# Patient Record
Sex: Female | Born: 1965 | Race: Black or African American | Hispanic: No | Smoking: Current every day smoker
Health system: Southern US, Community
[De-identification: ages and names within clinical notes are randomized; demographics above are authoritative.]

## PROBLEM LIST (undated history)

## (undated) ENCOUNTER — Emergency Department (HOSPITAL_COMMUNITY)

## (undated) DIAGNOSIS — J449 Chronic obstructive pulmonary disease, unspecified: Secondary | ICD-10-CM

## (undated) DIAGNOSIS — R110 Nausea: Secondary | ICD-10-CM

## (undated) DIAGNOSIS — F329 Major depressive disorder, single episode, unspecified: Secondary | ICD-10-CM

## (undated) DIAGNOSIS — E785 Hyperlipidemia, unspecified: Secondary | ICD-10-CM

## (undated) DIAGNOSIS — F32A Depression, unspecified: Secondary | ICD-10-CM

## (undated) DIAGNOSIS — G8929 Other chronic pain: Secondary | ICD-10-CM

## (undated) DIAGNOSIS — K219 Gastro-esophageal reflux disease without esophagitis: Secondary | ICD-10-CM

## (undated) DIAGNOSIS — F419 Anxiety disorder, unspecified: Secondary | ICD-10-CM

## (undated) DIAGNOSIS — Z9981 Dependence on supplemental oxygen: Secondary | ICD-10-CM

## (undated) DIAGNOSIS — I4891 Unspecified atrial fibrillation: Secondary | ICD-10-CM

## (undated) DIAGNOSIS — F99 Mental disorder, not otherwise specified: Secondary | ICD-10-CM

## (undated) DIAGNOSIS — R0600 Dyspnea, unspecified: Secondary | ICD-10-CM

## (undated) DIAGNOSIS — E039 Hypothyroidism, unspecified: Secondary | ICD-10-CM

## (undated) DIAGNOSIS — E1140 Type 2 diabetes mellitus with diabetic neuropathy, unspecified: Secondary | ICD-10-CM

## (undated) DIAGNOSIS — F23 Brief psychotic disorder: Secondary | ICD-10-CM

## (undated) DIAGNOSIS — R51 Headache: Secondary | ICD-10-CM

## (undated) DIAGNOSIS — M549 Dorsalgia, unspecified: Secondary | ICD-10-CM

## (undated) DIAGNOSIS — I10 Essential (primary) hypertension: Secondary | ICD-10-CM

## (undated) DIAGNOSIS — F209 Schizophrenia, unspecified: Secondary | ICD-10-CM

## (undated) DIAGNOSIS — T50901A Poisoning by unspecified drugs, medicaments and biological substances, accidental (unintentional), initial encounter: Secondary | ICD-10-CM

## (undated) DIAGNOSIS — Z720 Tobacco use: Secondary | ICD-10-CM

## (undated) DIAGNOSIS — M25569 Pain in unspecified knee: Secondary | ICD-10-CM

## (undated) HISTORY — PX: ABDOMINAL HYSTERECTOMY: SHX81

## (undated) HISTORY — DX: Essential (primary) hypertension: I10

## (undated) HISTORY — DX: Tobacco use: Z72.0

## (undated) HISTORY — DX: Other chronic pain: G89.29

## (undated) HISTORY — DX: Hyperlipidemia, unspecified: E78.5

## (undated) HISTORY — PX: KNEE SURGERY: SHX244

## (undated) HISTORY — DX: Poisoning by unspecified drugs, medicaments and biological substances, accidental (unintentional), initial encounter: T50.901A

---

## 1997-10-24 ENCOUNTER — Encounter: Admission: RE | Admit: 1997-10-24 | Discharge: 1998-01-22 | Payer: Self-pay | Admitting: *Deleted

## 1997-11-05 ENCOUNTER — Emergency Department (HOSPITAL_COMMUNITY): Admission: EM | Admit: 1997-11-05 | Discharge: 1997-11-05 | Payer: Self-pay | Admitting: Emergency Medicine

## 1997-11-09 ENCOUNTER — Emergency Department (HOSPITAL_COMMUNITY): Admission: EM | Admit: 1997-11-09 | Discharge: 1997-11-09 | Payer: Self-pay | Admitting: Emergency Medicine

## 1998-01-16 ENCOUNTER — Emergency Department (HOSPITAL_COMMUNITY): Admission: EM | Admit: 1998-01-16 | Discharge: 1998-01-16 | Payer: Self-pay | Admitting: Family Medicine

## 1998-03-05 ENCOUNTER — Inpatient Hospital Stay (HOSPITAL_COMMUNITY): Admission: AD | Admit: 1998-03-05 | Discharge: 1998-03-05 | Payer: Self-pay | Admitting: Obstetrics & Gynecology

## 1998-03-22 ENCOUNTER — Emergency Department (HOSPITAL_COMMUNITY): Admission: EM | Admit: 1998-03-22 | Discharge: 1998-03-22 | Payer: Self-pay | Admitting: Emergency Medicine

## 1998-04-05 ENCOUNTER — Other Ambulatory Visit: Admission: RE | Admit: 1998-04-05 | Discharge: 1998-04-05 | Payer: Self-pay | Admitting: Internal Medicine

## 1998-05-15 ENCOUNTER — Encounter: Admission: RE | Admit: 1998-05-15 | Discharge: 1998-05-15 | Payer: Self-pay | Admitting: Obstetrics & Gynecology

## 1999-04-12 ENCOUNTER — Emergency Department (HOSPITAL_COMMUNITY): Admission: EM | Admit: 1999-04-12 | Discharge: 1999-04-12 | Payer: Self-pay

## 1999-06-08 ENCOUNTER — Emergency Department (HOSPITAL_COMMUNITY): Admission: EM | Admit: 1999-06-08 | Discharge: 1999-06-08 | Payer: Self-pay

## 1999-08-15 ENCOUNTER — Emergency Department (HOSPITAL_COMMUNITY): Admission: EM | Admit: 1999-08-15 | Discharge: 1999-08-15 | Payer: Self-pay

## 1999-10-21 ENCOUNTER — Emergency Department (HOSPITAL_COMMUNITY): Admission: EM | Admit: 1999-10-21 | Discharge: 1999-10-22 | Payer: Self-pay | Admitting: Emergency Medicine

## 2000-06-25 ENCOUNTER — Inpatient Hospital Stay (HOSPITAL_COMMUNITY): Admission: AD | Admit: 2000-06-25 | Discharge: 2000-06-25 | Payer: Self-pay | Admitting: Obstetrics & Gynecology

## 2001-09-26 ENCOUNTER — Emergency Department (HOSPITAL_COMMUNITY): Admission: EM | Admit: 2001-09-26 | Discharge: 2001-09-26 | Payer: Self-pay | Admitting: Emergency Medicine

## 2001-09-26 ENCOUNTER — Encounter: Payer: Self-pay | Admitting: Emergency Medicine

## 2002-09-24 ENCOUNTER — Emergency Department (HOSPITAL_COMMUNITY): Admission: EM | Admit: 2002-09-24 | Discharge: 2002-09-24 | Payer: Self-pay | Admitting: Emergency Medicine

## 2003-06-04 ENCOUNTER — Emergency Department (HOSPITAL_COMMUNITY): Admission: EM | Admit: 2003-06-04 | Discharge: 2003-06-04 | Payer: Self-pay | Admitting: Emergency Medicine

## 2003-06-11 ENCOUNTER — Emergency Department (HOSPITAL_COMMUNITY): Admission: EM | Admit: 2003-06-11 | Discharge: 2003-06-11 | Payer: Self-pay | Admitting: Emergency Medicine

## 2003-06-25 ENCOUNTER — Emergency Department (HOSPITAL_COMMUNITY): Admission: AD | Admit: 2003-06-25 | Discharge: 2003-06-25 | Payer: Self-pay | Admitting: Family Medicine

## 2003-06-29 ENCOUNTER — Encounter: Admission: RE | Admit: 2003-06-29 | Discharge: 2003-08-03 | Payer: Self-pay | Admitting: Occupational Medicine

## 2003-09-12 ENCOUNTER — Encounter
Admission: RE | Admit: 2003-09-12 | Discharge: 2003-12-11 | Payer: Self-pay | Admitting: Physical Medicine & Rehabilitation

## 2003-09-15 ENCOUNTER — Emergency Department (HOSPITAL_COMMUNITY): Admission: AD | Admit: 2003-09-15 | Discharge: 2003-09-15 | Payer: Self-pay | Admitting: Family Medicine

## 2003-11-22 ENCOUNTER — Emergency Department (HOSPITAL_COMMUNITY): Admission: EM | Admit: 2003-11-22 | Discharge: 2003-11-22 | Payer: Self-pay | Admitting: Emergency Medicine

## 2004-02-26 ENCOUNTER — Emergency Department (HOSPITAL_COMMUNITY): Admission: EM | Admit: 2004-02-26 | Discharge: 2004-02-26 | Payer: Self-pay | Admitting: Emergency Medicine

## 2004-05-24 ENCOUNTER — Emergency Department (HOSPITAL_COMMUNITY): Admission: EM | Admit: 2004-05-24 | Discharge: 2004-05-24 | Payer: Self-pay | Admitting: Emergency Medicine

## 2004-06-23 ENCOUNTER — Emergency Department (HOSPITAL_COMMUNITY): Admission: EM | Admit: 2004-06-23 | Discharge: 2004-06-23 | Payer: Self-pay | Admitting: Emergency Medicine

## 2005-04-25 ENCOUNTER — Emergency Department (HOSPITAL_COMMUNITY): Admission: EM | Admit: 2005-04-25 | Discharge: 2005-04-25 | Payer: Self-pay | Admitting: Emergency Medicine

## 2005-04-26 ENCOUNTER — Emergency Department (HOSPITAL_COMMUNITY): Admission: EM | Admit: 2005-04-26 | Discharge: 2005-04-26 | Payer: Self-pay | Admitting: Emergency Medicine

## 2005-08-07 ENCOUNTER — Emergency Department (HOSPITAL_COMMUNITY): Admission: EM | Admit: 2005-08-07 | Discharge: 2005-08-07 | Payer: Self-pay | Admitting: Emergency Medicine

## 2005-10-20 ENCOUNTER — Emergency Department (HOSPITAL_COMMUNITY): Admission: EM | Admit: 2005-10-20 | Discharge: 2005-10-20 | Payer: Self-pay | Admitting: Emergency Medicine

## 2006-09-29 ENCOUNTER — Emergency Department (HOSPITAL_COMMUNITY): Admission: EM | Admit: 2006-09-29 | Discharge: 2006-09-29 | Payer: Self-pay | Admitting: Emergency Medicine

## 2007-01-28 ENCOUNTER — Encounter: Admission: RE | Admit: 2007-01-28 | Discharge: 2007-01-28 | Payer: Self-pay | Admitting: Internal Medicine

## 2007-04-13 ENCOUNTER — Ambulatory Visit (HOSPITAL_COMMUNITY): Admission: RE | Admit: 2007-04-13 | Discharge: 2007-04-14 | Payer: Self-pay | Admitting: Internal Medicine

## 2007-05-02 ENCOUNTER — Emergency Department (HOSPITAL_COMMUNITY): Admission: EM | Admit: 2007-05-02 | Discharge: 2007-05-02 | Payer: Self-pay | Admitting: Emergency Medicine

## 2007-06-16 ENCOUNTER — Encounter
Admission: RE | Admit: 2007-06-16 | Discharge: 2007-06-16 | Payer: Self-pay | Admitting: Physical Medicine and Rehabilitation

## 2007-07-19 ENCOUNTER — Encounter: Admission: RE | Admit: 2007-07-19 | Discharge: 2007-07-19 | Payer: Self-pay | Admitting: Internal Medicine

## 2007-07-26 ENCOUNTER — Encounter
Admission: RE | Admit: 2007-07-26 | Discharge: 2007-08-04 | Payer: Self-pay | Admitting: Physical Medicine and Rehabilitation

## 2007-08-01 ENCOUNTER — Emergency Department (HOSPITAL_COMMUNITY): Admission: EM | Admit: 2007-08-01 | Discharge: 2007-08-01 | Payer: Self-pay | Admitting: Emergency Medicine

## 2007-08-12 ENCOUNTER — Ambulatory Visit (HOSPITAL_COMMUNITY)
Admission: RE | Admit: 2007-08-12 | Discharge: 2007-08-12 | Payer: Self-pay | Admitting: Physical Medicine and Rehabilitation

## 2007-08-12 ENCOUNTER — Ambulatory Visit: Payer: Self-pay | Admitting: Vascular Surgery

## 2007-08-26 ENCOUNTER — Emergency Department (HOSPITAL_COMMUNITY): Admission: EM | Admit: 2007-08-26 | Discharge: 2007-08-26 | Payer: Self-pay | Admitting: Emergency Medicine

## 2007-08-26 ENCOUNTER — Encounter (INDEPENDENT_AMBULATORY_CARE_PROVIDER_SITE_OTHER): Payer: Self-pay | Admitting: Emergency Medicine

## 2007-11-28 ENCOUNTER — Emergency Department (HOSPITAL_COMMUNITY): Admission: EM | Admit: 2007-11-28 | Discharge: 2007-11-28 | Payer: Self-pay | Admitting: Emergency Medicine

## 2008-12-14 ENCOUNTER — Encounter: Admission: RE | Admit: 2008-12-14 | Discharge: 2008-12-14 | Payer: Self-pay | Admitting: Internal Medicine

## 2009-01-03 ENCOUNTER — Encounter: Admission: RE | Admit: 2009-01-03 | Discharge: 2009-04-03 | Payer: Self-pay | Admitting: Internal Medicine

## 2009-04-04 ENCOUNTER — Emergency Department (HOSPITAL_COMMUNITY): Admission: EM | Admit: 2009-04-04 | Discharge: 2009-04-04 | Payer: Self-pay | Admitting: Emergency Medicine

## 2009-04-10 ENCOUNTER — Emergency Department (HOSPITAL_COMMUNITY): Admission: EM | Admit: 2009-04-10 | Discharge: 2009-04-11 | Payer: Self-pay | Admitting: Emergency Medicine

## 2009-05-04 ENCOUNTER — Encounter: Admission: RE | Admit: 2009-05-04 | Discharge: 2009-06-20 | Payer: Self-pay | Admitting: Internal Medicine

## 2009-08-07 ENCOUNTER — Encounter: Admission: RE | Admit: 2009-08-07 | Discharge: 2009-08-07 | Payer: Self-pay | Admitting: Internal Medicine

## 2009-10-23 ENCOUNTER — Encounter: Admission: RE | Admit: 2009-10-23 | Discharge: 2009-10-23 | Payer: Self-pay | Admitting: Internal Medicine

## 2010-01-22 ENCOUNTER — Encounter: Admission: RE | Admit: 2010-01-22 | Discharge: 2010-01-22 | Payer: Self-pay | Admitting: Internal Medicine

## 2010-03-13 ENCOUNTER — Encounter
Admission: RE | Admit: 2010-03-13 | Discharge: 2010-03-13 | Payer: Self-pay | Source: Home / Self Care | Admitting: Internal Medicine

## 2010-06-11 ENCOUNTER — Encounter
Admission: RE | Admit: 2010-06-11 | Discharge: 2010-07-23 | Payer: Self-pay | Source: Home / Self Care | Attending: Internal Medicine | Admitting: Internal Medicine

## 2010-06-25 ENCOUNTER — Encounter
Admission: RE | Admit: 2010-06-25 | Discharge: 2010-06-25 | Payer: Self-pay | Source: Home / Self Care | Attending: Physical Medicine and Rehabilitation | Admitting: Physical Medicine and Rehabilitation

## 2010-07-09 ENCOUNTER — Encounter
Admission: RE | Admit: 2010-07-09 | Discharge: 2010-07-23 | Payer: Self-pay | Source: Home / Self Care | Attending: Internal Medicine | Admitting: Internal Medicine

## 2010-07-12 ENCOUNTER — Encounter
Admission: RE | Admit: 2010-07-12 | Discharge: 2010-07-12 | Payer: Self-pay | Source: Home / Self Care | Attending: Internal Medicine | Admitting: Internal Medicine

## 2010-07-14 ENCOUNTER — Encounter: Payer: Self-pay | Admitting: Internal Medicine

## 2010-07-15 ENCOUNTER — Encounter: Payer: Self-pay | Admitting: Internal Medicine

## 2010-07-19 ENCOUNTER — Emergency Department (HOSPITAL_COMMUNITY)
Admission: EM | Admit: 2010-07-19 | Discharge: 2010-07-19 | Payer: Self-pay | Source: Home / Self Care | Admitting: Emergency Medicine

## 2010-07-19 LAB — BASIC METABOLIC PANEL
BUN: 3 mg/dL — ABNORMAL LOW (ref 6–23)
CO2: 26 mEq/L (ref 19–32)
Calcium: 9.8 mg/dL (ref 8.4–10.5)
Chloride: 104 mEq/L (ref 96–112)
Creatinine, Ser: 0.66 mg/dL (ref 0.4–1.2)
GFR calc Af Amer: >60 mL/min (ref >60)
GFR calc non Af Amer: >60 mL/min (ref >60)
Glucose, Bld: 141 mg/dL — ABNORMAL HIGH (ref 70–99)
Sodium: 140 mEq/L (ref 135–145)

## 2010-07-19 LAB — URINALYSIS, ROUTINE W REFLEX MICROSCOPIC
Bilirubin Urine: NEGATIVE
Hgb urine dipstick: NEGATIVE
Nitrite: NEGATIVE
Urine Glucose, Fasting: NEGATIVE mg/dL

## 2010-07-19 LAB — DIFFERENTIAL
Basophils Relative: 0 % (ref 0–1)
Eosinophils Absolute: 0 10*3/uL (ref 0.0–0.7)
Lymphocytes Relative: 26 % (ref 12–46)
Lymphs Abs: 3.5 10*3/uL (ref 0.7–4.0)
Monocytes Relative: 4 % (ref 3–12)

## 2010-07-19 LAB — GLUCOSE, CAPILLARY: Glucose-Capillary: 157 mg/dL — ABNORMAL HIGH (ref 70–99)

## 2010-07-19 LAB — CBC
HCT: 44.6 % (ref 36.0–46.0)
Hemoglobin: 15.8 g/dL — ABNORMAL HIGH (ref 12.0–15.0)
RBC: 5.25 MIL/uL — ABNORMAL HIGH (ref 3.87–5.11)
RDW: 12.9 % (ref 11.5–15.5)
WBC: 13.7 10*3/uL — ABNORMAL HIGH (ref 4.0–10.5)

## 2010-08-20 ENCOUNTER — Ambulatory Visit: Payer: Self-pay | Admitting: Dietician

## 2010-08-30 ENCOUNTER — Other Ambulatory Visit (HOSPITAL_COMMUNITY): Payer: Self-pay | Admitting: Gastroenterology

## 2010-09-09 ENCOUNTER — Encounter (HOSPITAL_COMMUNITY)
Admission: RE | Admit: 2010-09-09 | Discharge: 2010-09-09 | Disposition: A | Discharge status: Home / Self Care | Payer: PRIVATE HEALTH INSURANCE | Source: Ambulatory Visit | Attending: Gastroenterology | Admitting: Gastroenterology

## 2010-09-26 LAB — CBC
HCT: 40.2 % (ref 36.0–46.0)
MCV: 90.7 fL (ref 78.0–100.0)
Platelets: 216 10*3/uL (ref 150–400)
RBC: 4.43 MIL/uL (ref 3.87–5.11)
WBC: 14.1 10*3/uL — ABNORMAL HIGH (ref 4.0–10.5)

## 2010-09-26 LAB — DIFFERENTIAL
Basophils Absolute: 0 10*3/uL (ref 0.0–0.1)
Basophils Relative: 0 % (ref 0–1)
Eosinophils Relative: 0 % (ref 0–5)
Monocytes Absolute: 0.2 10*3/uL (ref 0.1–1.0)

## 2010-09-26 LAB — COMPREHENSIVE METABOLIC PANEL
AST: 20 U/L (ref 0–37)
Albumin: 3.9 g/dL (ref 3.5–5.2)
Alkaline Phosphatase: 89 U/L (ref 39–117)
BUN: 8 mg/dL (ref 6–23)
CO2: 26 mEq/L (ref 19–32)
Chloride: 106 mEq/L (ref 96–112)
Creatinine, Ser: 0.75 mg/dL (ref 0.4–1.2)
GFR calc Af Amer: >60 mL/min (ref >60)
GFR calc non Af Amer: >60 mL/min (ref >60)
Potassium: 4.1 mEq/L (ref 3.5–5.1)
Total Bilirubin: 0.5 mg/dL (ref 0.3–1.2)

## 2010-09-26 LAB — URINALYSIS, ROUTINE W REFLEX MICROSCOPIC
Bilirubin Urine: NEGATIVE
Glucose, UA: NEGATIVE mg/dL
Hgb urine dipstick: NEGATIVE
Ketones, ur: NEGATIVE mg/dL
Leukocytes, UA: NEGATIVE
Protein, ur: 30 mg/dL — AB
pH: 8.5 — ABNORMAL HIGH (ref 5.0–8.0)

## 2010-09-26 LAB — URINE MICROSCOPIC-ADD ON

## 2010-09-26 LAB — LIPASE, BLOOD: Lipase: 23 U/L (ref 11–59)

## 2010-10-30 ENCOUNTER — Other Ambulatory Visit (HOSPITAL_COMMUNITY): Payer: Self-pay | Admitting: Gastroenterology

## 2010-11-08 NOTE — Group Therapy Note (Signed)
HISTORY:  This 45 year old female reports being on the job at [employer] on  May 22, 2003 when a heavy box fell off the belt into her arms and she  felt immediate popping and burning in the back which lasted all day; no  relief with Tylenol.  She went to [facility] emergency department the  following day, received and injection (question Toradol), and some pain  medications which she states were not helpful.  She returned to [facility]  ED x1 and went to another Ur[facility] Center on [facility] before she  was sent by [employer] to occupational medicine at [facility].  She  states she has tried OxyContin and steroid dose packs, Skelaxin,  Hydrocodone, Tramadol cyclobenzaprine, promethazine without much help.  She  no longer takes these on a regular basis but takes one or another in a  random pattern.  She does take amitriptyline 25, 2 tablets q.h.s. for about  one month now.   She states that early on she was sent to physical therapy but states that  this made her worse.  I have reviewed Dr. Lanae Crumbly notes.  On August 08, 2003 she had a maximum lifting limit of 10 pounds sitting, was allowed the  same restrictions on August 23, 2003.  She has been driving half-days.  Reviewed PT notes from outpatient therapy at [facility] on [facility].  The patient did not tolerate activity based on PT note of July 03, 2003.  Had open MRI try at imaging that showed just some mild degenerative change,  mild facet arthrosis L4-5, no foraminal or central canal stenosis.   PAST MEDICAL HISTORY:  Significant for migraines.   PAST SURGICAL HISTORY:  Denies.   PAIN EXACERBATING FACTORS:  Bending, walking, working, therapy and sometimes  sitting.  Sitting is relatively better than standing.  Pain diagram shows  posterior pain from the mid back on down to feet.  Her pain rating is an 8  out of 10 on average.   ALLERGIES:  No known drug allergies.   WORK HISTORY:  She has been working 5  years at [employer], works 12-hour days 5  days a week per her report.  Now working about 7.5 hours a day.   SOCIAL HISTORY:  Married.  Smokes.   FAMILY HISTORY:  Positive for high blood pressure.   REVIEW OF SYSTEMS:  Positive for poor sleep.   PHYSICAL EXAMINATION:  Blood pressure 118/77, pulse 85, respiratory rate 16,  O2 saturation 97% on room air.   Gait is with a limp although does not favor a particular leg, just arises  stiffly from the chair.  She is able to toe-walk and heel-walk in a guarded  fashion.  She has pain with even minimal palpation of the low back.  She has  give-away weakness in bilateral upper and lower extremities which she  attributes to her back pain.  She has pain with internal/external rotation  of the hips which she states is coming from her back. She withdraws to pin  prick testing along her feet although she states that she is numb.  Her  straight leg raising test is negative.  She has pain with even light  palpation of her lateral thighs but normal sensation.   IMPRESSION:  Lumbar strain with L3 positive Waddell's signs, that of  superficial tenderness regional disturbance and overreaction.   1. Given the numbness that she reports in the lower extremities would get an  EMG to rule out any type of peripheral neuropathy.  2. Do not think she would benefit from any type of spinal injection.  3. If EMG positive consider some Neurontin.  4. Would encourage working up to full time employment with at least the     current restrictions.   I will see her back for the EMG and make further recommendations at that  time.     Erick Colace, M.D.   AEK/MedQ  D:  09/14/2003 18:01:22  T:  09/15/2003 05:04:43  Job #:  [id number]   cc:   Zelphia Cairo. Alto Denver, M.D.  [address]  [place]  [place] [address]  Fax: [phone]

## 2010-11-26 ENCOUNTER — Encounter (HOSPITAL_COMMUNITY)
Admission: RE | Admit: 2010-11-26 | Discharge: 2010-11-26 | Disposition: A | Discharge status: Home / Self Care | Payer: PRIVATE HEALTH INSURANCE | Source: Ambulatory Visit | Attending: Gastroenterology | Admitting: Gastroenterology

## 2010-11-26 ENCOUNTER — Encounter (HOSPITAL_COMMUNITY): Payer: Self-pay

## 2010-11-26 DIAGNOSIS — R109 Unspecified abdominal pain: Secondary | ICD-10-CM | POA: Insufficient documentation

## 2010-11-26 MED ORDER — SINCALIDE 5 MCG IJ SOLR: 0.0200 ug/kg | Freq: Once | INTRAMUSCULAR | Status: DC

## 2010-11-26 MED ORDER — TECHNETIUM TC 99M MEBROFENIN IV KIT
5.5000 | PACK | Freq: Once | INTRAVENOUS | Status: AC | PRN
  Administered 2010-11-26: 5.5 via INTRAVENOUS

## 2010-12-02 ENCOUNTER — Other Ambulatory Visit: Payer: Self-pay | Admitting: Gastroenterology

## 2010-12-05 ENCOUNTER — Ambulatory Visit
Admission: RE | Admit: 2010-12-05 | Discharge: 2010-12-05 | Disposition: A | Discharge status: Home / Self Care | Payer: PRIVATE HEALTH INSURANCE | Source: Ambulatory Visit | Attending: Gastroenterology | Admitting: Gastroenterology

## 2010-12-05 MED ORDER — IOHEXOL 350 MG/ML SOLN
100.0000 mL | Freq: Once | INTRAVENOUS | Status: AC | PRN
  Administered 2010-12-05: 100 mL via INTRAVENOUS

## 2011-02-24 ENCOUNTER — Emergency Department (HOSPITAL_COMMUNITY): Payer: PRIVATE HEALTH INSURANCE

## 2011-02-24 ENCOUNTER — Emergency Department (HOSPITAL_COMMUNITY)
Admission: EM | Admit: 2011-02-24 | Discharge: 2011-02-24 | Disposition: A | Discharge status: Home / Self Care | Payer: PRIVATE HEALTH INSURANCE | Attending: Emergency Medicine | Admitting: Emergency Medicine

## 2011-02-24 DIAGNOSIS — E119 Type 2 diabetes mellitus without complications: Secondary | ICD-10-CM | POA: Insufficient documentation

## 2011-02-24 DIAGNOSIS — R109 Unspecified abdominal pain: Secondary | ICD-10-CM | POA: Insufficient documentation

## 2011-02-24 DIAGNOSIS — N3943 Post-void dribbling: Secondary | ICD-10-CM | POA: Insufficient documentation

## 2011-02-24 DIAGNOSIS — R110 Nausea: Secondary | ICD-10-CM | POA: Insufficient documentation

## 2011-02-24 LAB — DIFFERENTIAL
Basophils Absolute: 0 10*3/uL (ref 0.0–0.1)
Basophils Relative: 0 % (ref 0–1)
Eosinophils Relative: 1 % (ref 0–5)
Lymphocytes Relative: 22 % (ref 12–46)
Monocytes Absolute: 0.4 10*3/uL (ref 0.1–1.0)
Neutro Abs: 6.7 10*3/uL (ref 1.7–7.7)

## 2011-02-24 LAB — CBC
MCHC: 33.9 g/dL (ref 30.0–36.0)
RDW: 13.4 % (ref 11.5–15.5)
WBC: 9.3 10*3/uL (ref 4.0–10.5)

## 2011-02-24 LAB — URINALYSIS, ROUTINE W REFLEX MICROSCOPIC
Glucose, UA: NEGATIVE mg/dL
Hgb urine dipstick: NEGATIVE
Specific Gravity, Urine: 1.022 (ref 1.005–1.030)
Urobilinogen, UA: 1 mg/dL (ref 0.0–1.0)
pH: 7 (ref 5.0–8.0)

## 2011-02-24 LAB — COMPREHENSIVE METABOLIC PANEL
ALT: 13 U/L (ref 0–35)
AST: 17 U/L (ref 0–37)
Albumin: 3.4 g/dL — ABNORMAL LOW (ref 3.5–5.2)
Alkaline Phosphatase: 75 U/L (ref 39–117)
Calcium: 8.9 mg/dL (ref 8.4–10.5)
Potassium: 4.3 mEq/L (ref 3.5–5.1)
Sodium: 139 mEq/L (ref 135–145)
Total Protein: 6.3 g/dL (ref 6.0–8.3)

## 2011-02-24 LAB — POCT PREGNANCY, URINE: Preg Test, Ur: NEGATIVE

## 2011-02-24 MED ORDER — IOHEXOL 300 MG/ML  SOLN
100.0000 mL | Freq: Once | INTRAMUSCULAR | Status: AC | PRN
  Administered 2011-02-24: 100 mL via INTRAVENOUS

## 2011-02-25 ENCOUNTER — Other Ambulatory Visit (HOSPITAL_COMMUNITY): Payer: PRIVATE HEALTH INSURANCE

## 2011-02-27 ENCOUNTER — Encounter (HOSPITAL_COMMUNITY)
Admission: RE | Admit: 2011-02-27 | Discharge: 2011-02-27 | Disposition: A | Discharge status: Home / Self Care | Payer: PRIVATE HEALTH INSURANCE | Source: Ambulatory Visit | Attending: Obstetrics and Gynecology | Admitting: Obstetrics and Gynecology

## 2011-02-27 ENCOUNTER — Other Ambulatory Visit: Payer: Self-pay

## 2011-02-27 ENCOUNTER — Encounter (HOSPITAL_COMMUNITY): Payer: Self-pay

## 2011-02-27 HISTORY — DX: Nausea: R11.0

## 2011-02-27 HISTORY — DX: Gastro-esophageal reflux disease without esophagitis: K21.9

## 2011-02-27 HISTORY — DX: Mental disorder, not otherwise specified: F99

## 2011-02-27 HISTORY — DX: Headache: R51

## 2011-02-27 HISTORY — DX: Chronic obstructive pulmonary disease, unspecified: J44.9

## 2011-02-27 HISTORY — DX: Hypothyroidism, unspecified: E03.9

## 2011-02-27 HISTORY — DX: Anxiety disorder, unspecified: F41.9

## 2011-02-27 HISTORY — DX: Depression, unspecified: F32.A

## 2011-02-27 HISTORY — DX: Major depressive disorder, single episode, unspecified: F32.9

## 2011-02-27 LAB — BASIC METABOLIC PANEL
CO2: 31 mEq/L (ref 19–32)
Calcium: 8.6 mg/dL (ref 8.4–10.5)
Creatinine, Ser: 0.7 mg/dL (ref 0.50–1.10)
GFR calc non Af Amer: >60 mL/min (ref >60)

## 2011-02-27 LAB — PROTIME-INR
INR: 0.91 (ref 0.00–1.49)
Prothrombin Time: 12.4 seconds (ref 11.6–15.2)

## 2011-02-27 LAB — SURGICAL PCR SCREEN: Staphylococcus aureus: NEGATIVE

## 2011-02-27 LAB — CBC
MCH: 28.8 pg (ref 26.0–34.0)
MCV: 86.7 fL (ref 78.0–100.0)
Platelets: 197 10*3/uL (ref 150–400)
RDW: 13.2 % (ref 11.5–15.5)
WBC: 6.5 10*3/uL (ref 4.0–10.5)

## 2011-02-27 LAB — APTT: aPTT: 29 seconds (ref 24–37)

## 2011-02-27 NOTE — Patient Instructions (Signed)
20 Stephannie L Cala  02/27/2011   Your procedure is scheduled on:  03/04/11  Report to [facility] at 0600 AM. Arrive to main entrance  Call this number if you have problems the morning of surgery: [phone]   Remember:   Do not eat food:After Midnight.  Do not drink clear liquids: After Midnight.  Take these medicines the morning of surgery with A SIP OF WATER: bring inhaler to hospital, take oral meds per anesthesia   Do not wear jewelry, make-up or nail polish.  Do not wear lotions, powders, or perfumes. You may wear deodorant.  Do not shave 48 hours prior to surgery.  Do not bring valuables to the hospital.  Contacts, dentures or bridgework may not be worn into surgery.  Leave suitcase in the car. After surgery it may be brought to your room.  For patients admitted to the hospital, checkout time is 11:00 AM the day of discharge.   Patients discharged the day of surgery will not be allowed to drive home.  Name and phone number of your driver: Marilu Favre- [phone]  Special Instructions: CHG Shower Use Special Wash: 1/2 bottle night before surgery and 1/2 bottle morning of surgery.   Please read over the following fact sheets that you were given: none

## 2011-02-27 NOTE — Anesthesia Preprocedure Evaluation (Signed)
Anesthesia Evaluation  Name, MR# and DOB Patient awake  General Assessment Comment  Reviewed: Allergy & Precautions, H&P , NPO status , Patient's Chart, lab work & pertinent test results, reviewed documented beta blocker date and time   Airway Mallampati: III TM Distance: >3 FB Neck ROM: full    Dental  (+) Teeth Intact   Pulmonary  COPD (smoker x 25 years, currently 3 ppd) COPD inhaler  + rales  rales none    Cardiovascular Exercise Tolerance: Good regular Normal    Neuro/Psych   Headaches (migraines - once a week)  (+) PSYCHIATRIC DISORDERS (will take meds DOS (cymbalta and latuda)), Anxiety, Depression, Bipolar Disorder, Schizophrenia    GI/Hepatic/Renal   negative Liver ROS  negative Renal ROS   GERD      Endo/Other  (+) Diabetes mellitus- (recently taken off of insulin and metformin, now diet controlled), Type 2,Hypothyroidism (will take synthroid on DOS),      Abdominal   Musculoskeletal   Hematology negative hematology ROS (+)   Peds  Reproductive/Obstetrics negative OB ROS    Anesthesia Other Findings 3 PPD SMOKER            Anesthesia Physical Anesthesia Plan  ASA: III  Anesthesia Plan: General   Post-op Pain Management:    Induction:   Airway Management Planned:   Additional Equipment:   Intra-op Plan:   Post-operative Plan:   Informed Consent: I have reviewed the patients History and Physical, chart, labs and discussed the procedure including the risks, benefits and alternatives for the proposed anesthesia with the patient or authorized representative who has indicated his/her understanding and acceptance.   Dental Advisory Given  Plan Discussed with: CRNA  Anesthesia Plan Comments:         Anesthesia Quick Evaluation

## 2011-03-03 MED ORDER — DEXTROSE 5 % IV SOLN
1.0000 g | INTRAVENOUS | Status: AC
  Administered 2011-03-04: 1 g via INTRAVENOUS
  Filled 2011-03-03: qty 1

## 2011-03-03 NOTE — H&P (Signed)
44 year G2P2 presents for surgery for pelvic prolapse.  She has been evaluated by Dr. Sherron Monday for urinary incontinence.  He has recommended hysterectomy along with anterior repair , pubovaginal sling and placement of graft anteriorly.  She reports heavy periods that are very painful.  Med hx Diabetes  Anxiety Reflux  surg hx btl  Ob hx svd x 2  meds See list  All ASA  Soc history Positive for tobacco Denies EtOH Single  Fam Hx Heart Disease  AFVSS Gen alert and oriented Lung CTAB Car RRR Abd soft and nontender Pelvic EGBUS WNL Vagina Cystocele noted and uterine prolapse nonadnexal masses  IMP Urinary Incontinence Pelvic Prolapse  PLAN LAVH Dr. Sherron Monday to do cystoscopy, anterior repair, sling and graft This patient was counselled about the risks of the surgery.

## 2011-03-04 ENCOUNTER — Encounter (HOSPITAL_COMMUNITY)
Admission: RE | Disposition: A | Discharge status: Home / Self Care | Payer: Self-pay | Source: Ambulatory Visit | Attending: Obstetrics and Gynecology

## 2011-03-04 ENCOUNTER — Encounter (HOSPITAL_COMMUNITY): Payer: Self-pay | Admitting: *Deleted

## 2011-03-04 ENCOUNTER — Encounter (HOSPITAL_COMMUNITY): Payer: Self-pay | Admitting: Anesthesiology

## 2011-03-04 ENCOUNTER — Ambulatory Visit (HOSPITAL_COMMUNITY): Payer: PRIVATE HEALTH INSURANCE | Admitting: Anesthesiology

## 2011-03-04 ENCOUNTER — Encounter (HOSPITAL_COMMUNITY): Payer: Self-pay

## 2011-03-04 ENCOUNTER — Other Ambulatory Visit: Payer: Self-pay | Admitting: Obstetrics and Gynecology

## 2011-03-04 ENCOUNTER — Inpatient Hospital Stay (HOSPITAL_COMMUNITY)
Admission: RE | Admit: 2011-03-04 | Discharge: 2011-03-05 | DRG: 743 | Disposition: A | Discharge status: Home / Self Care | Payer: PRIVATE HEALTH INSURANCE | Source: Ambulatory Visit | Attending: Obstetrics and Gynecology | Admitting: Obstetrics and Gynecology

## 2011-03-04 DIAGNOSIS — D250 Submucous leiomyoma of uterus: Secondary | ICD-10-CM | POA: Diagnosis present

## 2011-03-04 DIAGNOSIS — D251 Intramural leiomyoma of uterus: Secondary | ICD-10-CM | POA: Diagnosis present

## 2011-03-04 DIAGNOSIS — N393 Stress incontinence (female) (male): Secondary | ICD-10-CM | POA: Diagnosis present

## 2011-03-04 DIAGNOSIS — E119 Type 2 diabetes mellitus without complications: Secondary | ICD-10-CM | POA: Diagnosis present

## 2011-03-04 DIAGNOSIS — N812 Incomplete uterovaginal prolapse: Principal | ICD-10-CM | POA: Diagnosis present

## 2011-03-04 DIAGNOSIS — Z90710 Acquired absence of both cervix and uterus: Secondary | ICD-10-CM

## 2011-03-04 HISTORY — PX: CYSTOSCOPY: SHX5120

## 2011-03-04 HISTORY — PX: LAPAROSCOPIC ASSISTED VAGINAL HYSTERECTOMY: SHX5398

## 2011-03-04 HISTORY — PX: BLADDER SUSPENSION: SHX72

## 2011-03-04 HISTORY — PX: CYSTOCELE REPAIR: SHX163

## 2011-03-04 LAB — GLUCOSE, CAPILLARY
Glucose-Capillary: 133 mg/dL — ABNORMAL HIGH (ref 70–99)
Glucose-Capillary: 155 mg/dL — ABNORMAL HIGH (ref 70–99)
Glucose-Capillary: 142 mg/dL — ABNORMAL HIGH (ref 70–99)
Glucose-Capillary: 123 mg/dL — ABNORMAL HIGH (ref 70–99)

## 2011-03-04 LAB — TYPE AND SCREEN
ABO/RH(D): A POS
Antibody Screen: POSITIVE
DAT, IgG: NEGATIVE

## 2011-03-04 LAB — PREGNANCY, URINE: Preg Test, Ur: NEGATIVE

## 2011-03-04 SURGERY — HYSTERECTOMY, VAGINAL, LAPAROSCOPY-ASSISTED
Anesthesia: General | Site: Vagina | Wound class: Clean Contaminated

## 2011-03-04 MED ORDER — DULOXETINE HCL 60 MG PO CPEP
90.0000 mg | ORAL_CAPSULE | Freq: Every day | ORAL | Status: DC
  Administered 2011-03-04: 90 mg via ORAL
  Filled 2011-03-04 (×2): qty 1

## 2011-03-04 MED ORDER — FENTANYL CITRATE 0.05 MG/ML IJ SOLN
INTRAMUSCULAR | Status: DC | PRN
  Administered 2011-03-04: 100 ug via INTRAVENOUS
  Administered 2011-03-04: 150 ug via INTRAVENOUS

## 2011-03-04 MED ORDER — PHENYLEPHRINE 40 MCG/ML (10ML) SYRINGE FOR IV PUSH (FOR BLOOD PRESSURE SUPPORT)
PREFILLED_SYRINGE | INTRAVENOUS | Status: AC
  Filled 2011-03-04: qty 5

## 2011-03-04 MED ORDER — ESMOLOL HCL 10 MG/ML IV SOLN
INTRAVENOUS | Status: AC
  Filled 2011-03-04: qty 10

## 2011-03-04 MED ORDER — ONDANSETRON HCL 4 MG/2ML IJ SOLN
INTRAMUSCULAR | Status: DC | PRN
  Administered 2011-03-04: 4 mg via INTRAVENOUS

## 2011-03-04 MED ORDER — MORPHINE SULFATE 10 MG/ML IJ SOLN
INTRAMUSCULAR | Status: AC
  Filled 2011-03-04: qty 1

## 2011-03-04 MED ORDER — ALBUTEROL SULFATE HFA 108 (90 BASE) MCG/ACT IN AERS
2.0000 | INHALATION_SPRAY | Freq: Four times a day (QID) | RESPIRATORY_TRACT | Status: DC | PRN
  Filled 2011-03-04: qty 6.7

## 2011-03-04 MED ORDER — NEOSTIGMINE METHYLSULFATE 1 MG/ML IJ SOLN
INTRAMUSCULAR | Status: AC
  Filled 2011-03-04: qty 10

## 2011-03-04 MED ORDER — MENTHOL 3 MG MT LOZG: 1.0000 | LOZENGE | OROMUCOSAL | Status: DC | PRN

## 2011-03-04 MED ORDER — DIPHENHYDRAMINE HCL 50 MG/ML IJ SOLN
INTRAMUSCULAR | Status: AC
  Filled 2011-03-04: qty 1

## 2011-03-04 MED ORDER — KETOROLAC TROMETHAMINE 30 MG/ML IJ SOLN: 30.0000 mg | Freq: Once | INTRAMUSCULAR | Status: DC

## 2011-03-04 MED ORDER — CLONAZEPAM 1 MG PO TABS
0.5000 mg | ORAL_TABLET | Freq: Three times a day (TID) | ORAL | Status: DC
  Administered 2011-03-04 – 2011-03-05 (×2): 1 mg via ORAL
  Filled 2011-03-04 (×3): qty 1

## 2011-03-04 MED ORDER — INSULIN ASPART 100 UNIT/ML ~~LOC~~ SOLN
0.0000 [IU] | SUBCUTANEOUS | Status: DC
  Administered 2011-03-04 – 2011-03-05 (×3): 2 [IU] via SUBCUTANEOUS
  Filled 2011-03-04: qty 3

## 2011-03-04 MED ORDER — HYDROMORPHONE HCL 1 MG/ML IJ SOLN
0.2500 mg | INTRAMUSCULAR | Status: DC | PRN
  Administered 2011-03-04 (×2): 0.5 mg via INTRAVENOUS

## 2011-03-04 MED ORDER — HYDROMORPHONE 0.3 MG/ML IV SOLN
INTRAVENOUS | Status: AC
  Filled 2011-03-04: qty 25

## 2011-03-04 MED ORDER — FENTANYL CITRATE 0.05 MG/ML IJ SOLN
INTRAMUSCULAR | Status: AC
  Filled 2011-03-04: qty 5

## 2011-03-04 MED ORDER — TRAMADOL HCL 50 MG PO TABS: 50.0000 mg | ORAL_TABLET | Freq: Four times a day (QID) | ORAL | Status: DC | PRN

## 2011-03-04 MED ORDER — DIPHENHYDRAMINE HCL 50 MG/ML IJ SOLN
12.5000 mg | Freq: Four times a day (QID) | INTRAMUSCULAR | Status: DC | PRN
  Administered 2011-03-04: 12.5 mg via INTRAVENOUS
  Filled 2011-03-04: qty 1

## 2011-03-04 MED ORDER — SCOPOLAMINE 1 MG/3DAYS TD PT72
MEDICATED_PATCH | TRANSDERMAL | Status: AC
  Filled 2011-03-04: qty 1

## 2011-03-04 MED ORDER — LACTATED RINGERS IR SOLN
Status: DC | PRN
  Administered 2011-03-04: 3000 mL

## 2011-03-04 MED ORDER — NALOXONE HCL 0.4 MG/ML IJ SOLN: 0.4000 mg | INTRAMUSCULAR | Status: DC | PRN

## 2011-03-04 MED ORDER — SUMATRIPTAN SUCCINATE 6 MG/0.5ML ~~LOC~~ SOLN
6.0000 mg | Freq: Once | SUBCUTANEOUS | Status: AC | PRN
  Filled 2011-03-04: qty 0.5

## 2011-03-04 MED ORDER — ONDANSETRON HCL 4 MG/2ML IJ SOLN
4.0000 mg | Freq: Four times a day (QID) | INTRAMUSCULAR | Status: DC | PRN
  Administered 2011-03-05: 4 mg via INTRAVENOUS
  Filled 2011-03-04: qty 2

## 2011-03-04 MED ORDER — ROCURONIUM BROMIDE 50 MG/5ML IV SOLN
INTRAVENOUS | Status: AC
  Filled 2011-03-04: qty 1

## 2011-03-04 MED ORDER — ROCURONIUM BROMIDE 100 MG/10ML IV SOLN
INTRAVENOUS | Status: DC | PRN
  Administered 2011-03-04: 35 mg via INTRAVENOUS

## 2011-03-04 MED ORDER — SODIUM CHLORIDE 0.9 % IJ SOLN: 9.0000 mL | INTRAMUSCULAR | Status: DC | PRN

## 2011-03-04 MED ORDER — HYDROXYZINE HCL 25 MG PO TABS
25.0000 mg | ORAL_TABLET | Freq: Two times a day (BID) | ORAL | Status: DC
  Administered 2011-03-04 – 2011-03-05 (×2): 25 mg via ORAL
  Filled 2011-03-04 (×5): qty 1

## 2011-03-04 MED ORDER — NEOSTIGMINE METHYLSULFATE 1 MG/ML IJ SOLN
INTRAMUSCULAR | Status: DC | PRN
  Administered 2011-03-04: 4 mg via INTRAMUSCULAR

## 2011-03-04 MED ORDER — BISACODYL 10 MG RE SUPP: 10.0000 mg | Freq: Every day | RECTAL | Status: DC | PRN

## 2011-03-04 MED ORDER — DIPHENHYDRAMINE HCL 50 MG/ML IJ SOLN
INTRAMUSCULAR | Status: DC | PRN
  Administered 2011-03-04: 12.5 mg via INTRAVENOUS

## 2011-03-04 MED ORDER — CIPROFLOXACIN HCL 250 MG PO TABS
250.0000 mg | ORAL_TABLET | Freq: Two times a day (BID) | ORAL | Status: DC
  Administered 2011-03-05: 250 mg via ORAL
  Filled 2011-03-04 (×3): qty 1

## 2011-03-04 MED ORDER — TEMAZEPAM 15 MG PO CAPS: 15.0000 mg | ORAL_CAPSULE | Freq: Every evening | ORAL | Status: DC | PRN

## 2011-03-04 MED ORDER — IBUPROFEN 800 MG PO TABS: 800.0000 mg | ORAL_TABLET | Freq: Three times a day (TID) | ORAL | Status: DC | PRN

## 2011-03-04 MED ORDER — IBUPROFEN 600 MG PO TABS
600.0000 mg | ORAL_TABLET | Freq: Four times a day (QID) | ORAL | Status: DC | PRN
  Filled 2011-03-04: qty 1

## 2011-03-04 MED ORDER — METOCLOPRAMIDE HCL 5 MG/ML IJ SOLN
INTRAMUSCULAR | Status: AC
  Administered 2011-03-04: 10 mg via INTRAVENOUS
  Filled 2011-03-04: qty 2

## 2011-03-04 MED ORDER — LIDOCAINE HCL (CARDIAC) 20 MG/ML IV SOLN
INTRAVENOUS | Status: DC | PRN
  Administered 2011-03-04: 100 mg via INTRAVENOUS

## 2011-03-04 MED ORDER — BUPIVACAINE HCL (PF) 0.25 % IJ SOLN
INTRAMUSCULAR | Status: DC | PRN
  Administered 2011-03-04: 10 mL

## 2011-03-04 MED ORDER — METOCLOPRAMIDE HCL 5 MG/ML IJ SOLN
10.0000 mg | Freq: Once | INTRAMUSCULAR | Status: AC | PRN
  Administered 2011-03-04: 10 mg via INTRAVENOUS

## 2011-03-04 MED ORDER — LURASIDONE HCL 40 MG PO TABS
40.0000 mg | ORAL_TABLET | Freq: Every day | ORAL | Status: DC
  Filled 2011-03-04 (×3): qty 1

## 2011-03-04 MED ORDER — GLYCOPYRROLATE 0.2 MG/ML IJ SOLN
INTRAMUSCULAR | Status: AC
  Filled 2011-03-04: qty 1

## 2011-03-04 MED ORDER — LIDOCAINE HCL (CARDIAC) 20 MG/ML IV SOLN
INTRAVENOUS | Status: AC
  Filled 2011-03-04: qty 5

## 2011-03-04 MED ORDER — HYDROMORPHONE 0.3 MG/ML IV SOLN
INTRAVENOUS | Status: DC
  Administered 2011-03-04: 23:00:00 via INTRAVENOUS
  Administered 2011-03-04: 11.3 mg via INTRAVENOUS
  Administered 2011-03-04: 13:00:00 via INTRAVENOUS
  Administered 2011-03-04: 6.79 mg via INTRAVENOUS
  Administered 2011-03-04: 6.67 mg via INTRAVENOUS
  Administered 2011-03-05: 1.19 mg via INTRAVENOUS
  Administered 2011-03-05: 0.2 mg via INTRAVENOUS
  Administered 2011-03-05: 11.9 mg via INTRAVENOUS

## 2011-03-04 MED ORDER — SCOPOLAMINE 1 MG/3DAYS TD PT72
MEDICATED_PATCH | TRANSDERMAL | Status: DC | PRN
  Administered 2011-03-04: 1 via TRANSDERMAL

## 2011-03-04 MED ORDER — MORPHINE SULFATE 10 MG/ML IJ SOLN
INTRAMUSCULAR | Status: DC | PRN
  Administered 2011-03-04 (×2): 5 mg via INTRAVENOUS

## 2011-03-04 MED ORDER — LACTATED RINGERS IV SOLN
INTRAVENOUS | Status: DC
  Administered 2011-03-04 (×3): via INTRAVENOUS

## 2011-03-04 MED ORDER — STERILE WATER FOR IRRIGATION IR SOLN
Status: DC | PRN
  Administered 2011-03-04: 1000 mL

## 2011-03-04 MED ORDER — DEXAMETHASONE SODIUM PHOSPHATE 10 MG/ML IJ SOLN
INTRAMUSCULAR | Status: AC
  Filled 2011-03-04: qty 1

## 2011-03-04 MED ORDER — LEVOTHYROXINE SODIUM 50 MCG PO TABS
50.0000 ug | ORAL_TABLET | Freq: Every day | ORAL | Status: DC
  Administered 2011-03-05: 50 ug via ORAL
  Filled 2011-03-04 (×3): qty 1

## 2011-03-04 MED ORDER — PROPOFOL 10 MG/ML IV EMUL
INTRAVENOUS | Status: AC
  Filled 2011-03-04: qty 20

## 2011-03-04 MED ORDER — TRAZODONE HCL 100 MG PO TABS
200.0000 mg | ORAL_TABLET | Freq: Every day | ORAL | Status: DC
  Administered 2011-03-04: 200 mg via ORAL
  Filled 2011-03-04 (×2): qty 2

## 2011-03-04 MED ORDER — MIDAZOLAM HCL 5 MG/5ML IJ SOLN
INTRAMUSCULAR | Status: DC | PRN
  Administered 2011-03-04: 2 mg via INTRAVENOUS

## 2011-03-04 MED ORDER — SODIUM CHLORIDE 0.9 % IR SOLN
Status: DC | PRN
  Administered 2011-03-04: 10:00:00

## 2011-03-04 MED ORDER — PROMETHAZINE HCL 25 MG/ML IJ SOLN
12.5000 mg | INTRAMUSCULAR | Status: DC | PRN
  Administered 2011-03-05: 12.5 mg via INTRAVENOUS
  Filled 2011-03-04: qty 1

## 2011-03-04 MED ORDER — GABAPENTIN 300 MG PO CAPS
300.0000 mg | ORAL_CAPSULE | Freq: Three times a day (TID) | ORAL | Status: DC
Start: 2011-03-04 — End: 2011-03-05
  Administered 2011-03-04 – 2011-03-05 (×2): 300 mg via ORAL
  Filled 2011-03-04 (×6): qty 1

## 2011-03-04 MED ORDER — DEXAMETHASONE SODIUM PHOSPHATE 10 MG/ML IJ SOLN
INTRAMUSCULAR | Status: DC | PRN
  Administered 2011-03-04: 4 mg via INTRAVENOUS

## 2011-03-04 MED ORDER — PROPOFOL 10 MG/ML IV EMUL
INTRAVENOUS | Status: DC | PRN
  Administered 2011-03-04: 200 mg via INTRAVENOUS

## 2011-03-04 MED ORDER — ESTRADIOL 0.1 MG/GM VA CREA
TOPICAL_CREAM | VAGINAL | Status: DC | PRN
  Administered 2011-03-04: 1 via VAGINAL

## 2011-03-04 MED ORDER — LACTATED RINGERS IV SOLN
INTRAVENOUS | Status: DC
  Administered 2011-03-04 – 2011-03-05 (×3): via INTRAVENOUS

## 2011-03-04 MED ORDER — PHENYLEPHRINE HCL 10 MG/ML IJ SOLN
INTRAMUSCULAR | Status: DC | PRN
  Administered 2011-03-04 (×2): 100 ug via INTRAVENOUS

## 2011-03-04 MED ORDER — BISACODYL 5 MG PO TBEC
5.0000 mg | DELAYED_RELEASE_TABLET | Freq: Every day | ORAL | Status: DC | PRN
  Filled 2011-03-04: qty 1

## 2011-03-04 MED ORDER — SUCCINYLCHOLINE CHLORIDE 20 MG/ML IJ SOLN
INTRAMUSCULAR | Status: DC | PRN
  Administered 2011-03-04: 100 mg via INTRAVENOUS

## 2011-03-04 MED ORDER — ONDANSETRON HCL 4 MG/2ML IJ SOLN
INTRAMUSCULAR | Status: AC
  Filled 2011-03-04: qty 2

## 2011-03-04 MED ORDER — GLYCOPYRROLATE 0.2 MG/ML IJ SOLN
INTRAMUSCULAR | Status: DC | PRN
  Administered 2011-03-04: .6 mg via INTRAVENOUS

## 2011-03-04 MED ORDER — MIDAZOLAM HCL 2 MG/2ML IJ SOLN
INTRAMUSCULAR | Status: AC
  Filled 2011-03-04: qty 2

## 2011-03-04 MED ORDER — SIMVASTATIN 20 MG PO TABS
20.0000 mg | ORAL_TABLET | Freq: Every day | ORAL | Status: DC
  Filled 2011-03-04 (×2): qty 1

## 2011-03-04 MED ORDER — LIDOCAINE-EPINEPHRINE (PF) 1 %-1:200000 IJ SOLN
INTRAMUSCULAR | Status: DC | PRN
  Administered 2011-03-04: 20 mL

## 2011-03-04 MED ORDER — HYDROMORPHONE HCL 1 MG/ML IJ SOLN
INTRAMUSCULAR | Status: AC
  Administered 2011-03-04: 0.5 mg via INTRAVENOUS
  Filled 2011-03-04: qty 1

## 2011-03-04 MED ORDER — DOCUSATE SODIUM 100 MG PO CAPS: 100.0000 mg | ORAL_CAPSULE | Freq: Every day | ORAL | Status: DC

## 2011-03-04 MED ORDER — SODIUM CHLORIDE 0.9 % IR SOLN
Freq: Once | Status: DC
  Filled 2011-03-04: qty 500000

## 2011-03-04 MED ORDER — ALBUTEROL SULFATE HFA 108 (90 BASE) MCG/ACT IN AERS
INHALATION_SPRAY | RESPIRATORY_TRACT | Status: DC | PRN
  Administered 2011-03-04: 6 via RESPIRATORY_TRACT

## 2011-03-04 MED ORDER — DIPHENHYDRAMINE HCL 12.5 MG/5ML PO ELIX: 12.5000 mg | ORAL_SOLUTION | Freq: Four times a day (QID) | ORAL | Status: DC | PRN

## 2011-03-04 SURGICAL SUPPLY — 78 items
BARRIER ADHS 3X4 INTERCEED (GAUZE/BANDAGES/DRESSINGS) IMPLANT
BLADE SURG 15 STRL LF C SS BP (BLADE) ×2 IMPLANT
BLADE SURG 15 STRL SS (BLADE) ×3
BRR ADH 4X3 ABS CNTRL BYND (GAUZE/BANDAGES/DRESSINGS)
CABLE HIGH FREQUENCY MONO STRZ (ELECTRODE) IMPLANT
CANISTER SUCTION 2500CC (MISCELLANEOUS) ×3 IMPLANT
CATH FOLEY 2WAY SLVR  5CC 16FR (CATHETERS) ×1
CATH FOLEY 2WAY SLVR 5CC 16FR (CATHETERS) ×2 IMPLANT
CATH ROBINSON RED A/P 16FR (CATHETERS) IMPLANT
CHLORAPREP W/TINT 26ML (MISCELLANEOUS) ×3 IMPLANT
CLOTH BEACON ORANGE TIMEOUT ST (SAFETY) ×3 IMPLANT
CONT PATH 16OZ SNAP LID 3702 (MISCELLANEOUS) ×3 IMPLANT
COVER MAYO STAND STRL (DRAPES) ×3 IMPLANT
COVER TABLE BACK 60X90 (DRAPES) ×3 IMPLANT
DECANTER SPIKE VIAL GLASS SM (MISCELLANEOUS) ×2 IMPLANT
DERMABOND ADVANCED (GAUZE/BANDAGES/DRESSINGS) ×8 IMPLANT
DEVICE CAPIO SUTURING (INSTRUMENTS)
DEVICE CAPIO SUTURING OPC (INSTRUMENTS) IMPLANT
DRAIN PENROSE 1/4X12 LTX (DRAIN) ×3 IMPLANT
DRAPE CAMERA CLOSED 9X96 (DRAPES) IMPLANT
DRAPE UNDERBUTTOCKS STRL (DRAPE) ×3 IMPLANT
DRAPE UTILITY XL STRL (DRAPES) ×6 IMPLANT
ELECT REM PT RETURN 9FT ADLT (ELECTROSURGICAL) ×3
ELECTRODE REM PT RTRN 9FT ADLT (ELECTROSURGICAL) ×2 IMPLANT
EVACUATOR SMOKE 8.L (FILTER) IMPLANT
GAUZE PACKING 1 X5 YD ST (GAUZE/BANDAGES/DRESSINGS) IMPLANT
GAUZE PACKING 2X5 YD STERILE (GAUZE/BANDAGES/DRESSINGS) ×1 IMPLANT
GAUZE SPONGE 4X4 16PLY XRAY LF (GAUZE/BANDAGES/DRESSINGS) ×1 IMPLANT
GAUZE VAGINAL PACKING 31 073 (GAUZE/BANDAGES/DRESSINGS) ×1 IMPLANT
GLOVE BIO SURGEON STRL SZ 6.5 (GLOVE) ×6 IMPLANT
GLOVE BIO SURGEON STRL SZ7.5 (GLOVE) ×6 IMPLANT
GLOVE BIOGEL PI IND STRL 6.5 (GLOVE) ×2 IMPLANT
GLOVE BIOGEL PI INDICATOR 6.5 (GLOVE) ×1
GOWN PREVENTION PLUS LG XLONG (DISPOSABLE) ×24 IMPLANT
NDL INSUFFLATION 14GA 120MM (NEEDLE) ×1 IMPLANT
NDL SPNL 22GX3.5 QUINCKE BK (NEEDLE) IMPLANT
NEEDLE INSUFFLATION 14GA 120MM (NEEDLE) ×3 IMPLANT
NEEDLE MAYO .5 CIRCLE (NEEDLE) IMPLANT
NEEDLE SPNL 22GX3.5 QUINCKE BK (NEEDLE) IMPLANT
NS IRRIG 1000ML POUR BTL (IV SOLUTION) ×6 IMPLANT
PACK LAVH (CUSTOM PROCEDURE TRAY) ×3 IMPLANT
PACK VAGINAL WOMENS (CUSTOM PROCEDURE TRAY) IMPLANT
PENCIL BUTTON HOLSTER BLD 10FT (ELECTRODE) ×3 IMPLANT
PLUG CATH AND CAP STER (CATHETERS) ×3 IMPLANT
RETRACTOR STAY HOOK 5MM (MISCELLANEOUS) ×3 IMPLANT
SEALER TISSUE G2 CVD JAW 45CM (ENDOMECHANICALS) ×3 IMPLANT
SET CYSTO W/LG BORE CLAMP LF (SET/KITS/TRAYS/PACK) ×3 IMPLANT
SET IRRIG TUBING LAPAROSCOPIC (IRRIGATION / IRRIGATOR) ×1 IMPLANT
SHEET LAVH (DRAPES) ×3 IMPLANT
SLING LYNX SUPRAPUBIC BX (SLING) IMPLANT
SLING SYSTEM SPARC (Sling) ×1 IMPLANT
SOLUTION ELECTROLUBE (MISCELLANEOUS) IMPLANT
STRIP CLOSURE SKIN 1/2X4 (GAUZE/BANDAGES/DRESSINGS) IMPLANT
STRIP CLOSURE SKIN 1/4X3 (GAUZE/BANDAGES/DRESSINGS) IMPLANT
SUT CAPIO ETHIBPND (SUTURE) IMPLANT
SUT SILK 2 0 FS (SUTURE) IMPLANT
SUT VIC AB 0 CT1 18XCR BRD8 (SUTURE) ×4 IMPLANT
SUT VIC AB 0 CT1 27 (SUTURE)
SUT VIC AB 0 CT1 27XBRD ANBCTR (SUTURE) IMPLANT
SUT VIC AB 0 CT1 36 (SUTURE) ×9 IMPLANT
SUT VIC AB 0 CT1 8-18 (SUTURE) ×9
SUT VIC AB 2-0 CT1 (SUTURE) IMPLANT
SUT VIC AB 2-0 CT2 27 (SUTURE) IMPLANT
SUT VIC AB 2-0 SH 27 (SUTURE) ×12
SUT VIC AB 2-0 SH 27XBRD (SUTURE) IMPLANT
SUT VIC AB 3-0 PS2 18 (SUTURE)
SUT VIC AB 3-0 PS2 18XBRD (SUTURE) IMPLANT
SUT VIC AB 4-0 PS2 27 (SUTURE) ×3 IMPLANT
SUT VICRYL 0 TIES 12 18 (SUTURE) ×3 IMPLANT
SUT VICRYL 0 UR6 27IN ABS (SUTURE) ×3 IMPLANT
TOWEL OR 17X24 6PK STRL BLUE (TOWEL DISPOSABLE) ×12 IMPLANT
TRAY FOLEY CATH 14FR (SET/KITS/TRAYS/PACK) ×7 IMPLANT
TROCAR Z-THREAD BLADED 11X100M (TROCAR) ×3 IMPLANT
TROCAR Z-THREAD BLADED 5X100MM (TROCAR) ×3 IMPLANT
TUBING CONNECTING 10 (TUBING) ×3 IMPLANT
TUBING NON-CON 1/4 X 20 CONN (TUBING) ×3 IMPLANT
WARMER LAPAROSCOPE (MISCELLANEOUS) ×3 IMPLANT
WATER STERILE IRR 1000ML POUR (IV SOLUTION) ×6 IMPLANT

## 2011-03-04 NOTE — OR Nursing (Addendum)
Dr Vincente Poli LAVH ended at 39  Dr. Sherron Monday in room at 832  Assistant Delia Chimes PA in at [facility] Procedure started at 835

## 2011-03-04 NOTE — Brief Op Note (Signed)
03/04/2011  8:42 AM  PATIENT:  [name]  45 y.o. female  PRE-OPERATIVE DIAGNOSIS:  Uterine Prolapse, Cystocele, Urinary incontinence   POST-OPERATIVE DIAGNOSIS:  same PROCEDURE:  Procedure(s): LAPAROSCOPIC ASSISTED VAGINAL HYSTERECTOMY ANTERIOR REPAIR (CYSTOCELE) SPARC PROCEDURE  SURGEON:  Surgeon(s): Jeani Hawking, MD Zelphia Cairo Scott A MacDiarmid  PHYSICIAN ASSISTANT:   ASSISTANTS: none   ANESTHESIA:   general  OR FLUID I/O:  Total I/O In: 1000 [I.V.:1000] Out: 500 [Urine:200; Blood:300]  BLOOD ADMINISTERED:none  DRAINS: Urinary Catheter (Foley)   LOCAL MEDICATIONS USED:  NONE  SPECIMEN:  Source of Specimen:  cervix and uterus  DISPOSITION OF SPECIMEN:  PATHOLOGY  COUNTS:  YES  TOURNIQUET:  * No tourniquets in log *  DICTATION: .Other Dictation: Dictation Number [id number]  PLAN OF CARE: Admit for overnight observation  PATIENT DISPOSITION:  PACU - hemodynamically stable.   Delay start of Pharmacological VTE agent (>24hrs) due to surgical blood loss or risk of bleeding:  not applicable

## 2011-03-04 NOTE — Progress Notes (Signed)
RT attempted to instruct flutter valve, patient is unable to properly return demo at this time. Patient did do two breaths thru flutter valve but was weak.

## 2011-03-04 NOTE — Transfer of Care (Signed)
Immediate Anesthesia Transfer of Care Note  Patient: [name]  Procedure(s) Performed:  LAPAROSCOPIC ASSISTED VAGINAL HYSTERECTOMY; ANTERIOR REPAIR (CYSTOCELE); SPARC PROCEDURE; CYSTOSCOPY  Patient Location: PACU  Anesthesia Type: General  Level of Consciousness: awake, alert  and oriented  Airway & Oxygen Therapy: Patient Spontanous Breathing and Patient connected to nasal cannula oxygen  Post-op Assessment: Report given to PACU RN and Post -op Vital signs reviewed and stable  Post vital signs: Reviewed and stable  Complications: No apparent anesthesia complications

## 2011-03-04 NOTE — Op Note (Signed)
[name], [name]                ACCOUNT NO.:  [id number]  MEDICAL RECORD NO.:  [id number]  LOCATION:  [facility]                          FACILITY:  [facility]  PHYSICIAN:  Serita Degroote L. Ermine Stebbins, M.D.DATE OF BIRTH:  May 11, 1966  DATE OF PROCEDURE:  03/04/2011 DATE OF DISCHARGE:                              OPERATIVE REPORT   PREOPERATIVE DIAGNOSES:  Uterine prolapse, cystocele, and urinary incontinence.  POSTOPERATIVE DIAGNOSES:  Uterine prolapse, cystocele, and urinary incontinence.  PROCEDURE:  Laparoscopic-assisted vaginal hysterectomy.  SURGEON:  Ilsa Bonello L. Vincente Poli, MD  ASSISTANT:  Zelphia Cairo, MD  ANESTHESIA:  General.  ESTIMATED BLOOD LOSS:  300 mL.  COMPLICATIONS:  None.  PATHOLOGY:  Uterus and cervix.  DRAINS:  Foley.  PROCEDURE:  The patient was taken to the operating room.  She was intubated.  She was prepped and draped in the usual sterile fashion. The uterine manipulator was inserted and a Foley catheter was inserted. Attention was turned to the abdomen where a small infraumbilical incision was made with the scalpel and the Veress needle was inserted. Pneumoperitoneum was performed.  The 11-mm trocar was inserted and the scope was introduced through the trocar sheath.  The patient was placed in Trendelenburg position and a 5-mm trocar was placed suprapubically. We then inspected the pelvis.  The ovaries and tubes appeared normal. Uterus was enlarged slightly.  We then placed an atraumatic grasper across the triple pedicle and placed Enseal instrument across the triple pedicle on the right, burned that and then cut it and carried that down to the round ligament with excellent hemostasis.  This was on the right side and then on the left side.  At this point, we then went down and released the pneumoperitoneum, went down to the vagina and placed a weighted speculum in the vagina and made a circumferential incision around the cervix.  The posterior cul-de-sac was  entered sharply using Mayo scissors.  The anterior cul-de-sac was entered sharply using Metzenbaum scissors.  Curved Heaney clamps were placed across the uterosacral cardinal ligaments on either side.  Each pedicle was clamped, cut, and suture ligated using 0 Vicryl suture.  We walked our way up the broad ligament staying very snug to the cervix and the uterus.  Each pedicle was clamped, cut, and suture ligated using 0 Vicryl suture.  We then easily reached the fundus, retroflexed the uterus, placed the remainder of the tissue, was clamped with Heaney clamps and this specimen was removed and identified her uterus and cervix.  The pedicles were secured using suture ligature of 0 Vicryl suture.  We then closed the posterior cuff with a running stitch using 0 Vicryl suture and then closed the cuff completely using 0 Vicryl running locked stitch.  At this point, Dr. Sherron Monday arrived.  I then went back up to the abdomen, irrigated the pelvis.  The pelvis was absolutely hemostatic.  I then released the pneumoperitoneum.  I placed an 0 Vicryl stitch at the infraumbilical site in the subcu area and closed each of the port with Dermabond, skin glue.  All sponge, lap, and instrument counts were correct x2.  EBL was approximately 300 mL at this point.  At this point, I  scrubbed out and Dr. Sherron Monday scrubbed in and the remainder of the procedure will be dictated by Dr. Alfredo Martinez.     Armilda Vanderlinden L. Vincente Poli, M.D.     Florestine Avers  D:  03/04/2011  T:  03/04/2011  Job:  [id number]  cc:   Martina Sinner, MD Fax: [phone]

## 2011-03-04 NOTE — Progress Notes (Signed)
Encounter addended by: Marrion Coy on: 03/04/2011  3:52 PM<BR>     Documentation filed: Notes Section

## 2011-03-04 NOTE — Anesthesia Postprocedure Evaluation (Signed)
Anesthesia Post Note  Patient: [name]  Procedure(s) Performed:  LAPAROSCOPIC ASSISTED VAGINAL HYSTERECTOMY; ANTERIOR REPAIR (CYSTOCELE); SPARC PROCEDURE; CYSTOSCOPY  Anesthesia type: GA  Patient location: PACU  Post pain: Pain level controlled  Post assessment: Post-op Vital signs reviewed  Last Vitals:  Filed Vitals:   03/04/11 1130  BP: 147/86  Pulse: 92  Temp:   Resp: 20    Post vital signs: Reviewed  Level of consciousness: sedated  Complications: No apparent anesthesia complications

## 2011-03-04 NOTE — Anesthesia Postprocedure Evaluation (Signed)
  Anesthesia Post-op Note  Patient: [name]  Procedure(s) Performed:  LAPAROSCOPIC ASSISTED VAGINAL HYSTERECTOMY; ANTERIOR REPAIR (CYSTOCELE); SPARC PROCEDURE; CYSTOSCOPY  Patient Location: PACU and Mother/Baby  Anesthesia Type: General  Level of Consciousness: awake, alert , oriented and patient cooperative  Airway and Oxygen Therapy: Patient Spontanous Breathing  Post-op Pain: none  Post-op Assessment: Post-op Vital signs reviewed, Patient's Cardiovascular Status Stable and No signs of Nausea or vomiting  Post-op Vital Signs: Reviewed and stable  Complications: No apparent anesthesia complications

## 2011-03-04 NOTE — Progress Notes (Signed)
H and P on chart. No significant changes. Proceed with LAVH poss BSO Dr Sherron Monday to do his sling and anterior repair.

## 2011-03-04 NOTE — Preoperative (Signed)
Beta Blockers   Reason not to administer Beta Blockers:Not Applicable 

## 2011-03-05 LAB — GLUCOSE, CAPILLARY
Glucose-Capillary: 148 mg/dL — ABNORMAL HIGH (ref 70–99)
Glucose-Capillary: 141 mg/dL — ABNORMAL HIGH (ref 70–99)

## 2011-03-05 LAB — CBC
MCH: 28.7 pg (ref 26.0–34.0)
Platelets: 195 10*3/uL (ref 150–400)
RBC: 4.53 MIL/uL (ref 3.87–5.11)
RDW: 13.4 % (ref 11.5–15.5)
WBC: 15.6 10*3/uL — ABNORMAL HIGH (ref 4.0–10.5)

## 2011-03-05 LAB — BASIC METABOLIC PANEL
CO2: 30 mEq/L (ref 19–32)
Calcium: 9.4 mg/dL (ref 8.4–10.5)
GFR calc Af Amer: >60 mL/min (ref >60)
Sodium: 133 mEq/L — ABNORMAL LOW (ref 135–145)

## 2011-03-05 MED ORDER — OXYCODONE-ACETAMINOPHEN 10-325 MG PO TABS: 1.0000 | ORAL_TABLET | ORAL | Status: AC | PRN

## 2011-03-05 MED ORDER — PROMETHAZINE HCL 25 MG PO TABS
25.0000 mg | ORAL_TABLET | Freq: Once | ORAL | Status: AC
  Administered 2011-03-05: 25 mg via ORAL
  Filled 2011-03-05 (×2): qty 1

## 2011-03-05 MED ORDER — CIPROFLOXACIN HCL 250 MG PO TABS: 250.0000 mg | ORAL_TABLET | Freq: Two times a day (BID) | ORAL | Status: AC

## 2011-03-05 NOTE — Progress Notes (Signed)
Dr. Vincente Poli rounded at 0800. Pt for impending discharge today. D/c instructions were given to pt with verbalized understanding. Questions asked and answered. Bladder training started this morning with pt voiding and after voiding bladder to be scanned. Pt understands all instructions well. Pt up to void at 0824 am voided 0 and scanned 647 cc. Delia Chimes PA notified of pt progress. Orders to in and out cath pt times 1 were given. Pt in and out cathed at 0849 am for 750 cc. Pt is very drowsy and lethargic with moments of slurred speech. PCA dilaudid was discontinued at 10 am as previously ordered. Pt refused to eat or take any medication at 10 am. Stated she was nauseated and takes phenergan daily for nausea. Dr Vincente Poli notified of pt request for po phenergan. Orders were given for po phenergan. She refused first attempt for medication stated she was to sick for medication and didn't want her insulin. I returned with medication at noon, phenergan po 25 was given along with other home medications. Pt tolerated medications well. 1108 am voided incontinently scan not done. Family member states pt voided but it was unmeasured because she missed the "hat". 1230 pt voided 250 cc no scan done. Delia Chimes PA and Dr Vincente Poli aware of pt status and progress. Pt later vomited 75 cc of mucus filled vomitus. No mediation seen in vomitus. Dr. Vincente Poli notified of pt status again about vomiting. After resting for a while pt okay for discharge. Vitals are stable pt discharged home awake alert and oriented x 4.

## 2011-03-05 NOTE — Progress Notes (Signed)
1 Day Post-Op Procedure(s): LAPAROSCOPIC ASSISTED VAGINAL HYSTERECTOMY ANTERIOR REPAIR (CYSTOCELE) SPARC PROCEDURE CYSTOSCOPY  Subjective: Patient reports incisional pain and + flatus. She is reluctant to do the incentive spirometer.   Objective: I have reviewed patient's vital signs, intake and output, medications and labs.  General: alert Resp: clear to auscultation bilaterally Cardio: regular rate and rhythm, S1, S2 normal, no murmur, click, rub or gallop Vaginal Bleeding: none Vaginal packing removed and dry  Assessment: s/p Procedure(s): LAPAROSCOPIC ASSISTED VAGINAL HYSTERECTOMY ANTERIOR REPAIR (CYSTOCELE) SPARC PROCEDURE CYSTOSCOPY: stable, progressing well and tolerating diet  Plan: Advance diet Encourage ambulation Advance to PO medication Discontinue IV fluids Discharge home  LOS: 1 day    Ramondo Dietze L 03/05/2011, 7:10 AM

## 2011-03-05 NOTE — Discharge Summary (Signed)
Admission Diagnosis: Uterine prolapse Cystocele Urinary Incontinence  Discharge Diagnosis: Same  Hospital course: 45 year old female admitted with pelvic organ prolapse. She underwent uncomplicated LAVH, Cystoscopy, Anterior Repair, and SPARC sling. She did well postop. She was uncooperative with incentive spirometer. By POD 1 she was ambulating, voiding, afebrile with stable vital signs. She will be discharged home in good condition. No driving for 1 week No lifting for 4 to 6 weeks. No intercourse for 6 weeks. Discharged home with percocet.

## 2011-03-05 NOTE — Progress Notes (Signed)
Per Delia Chimes PA for Dr Sherron Monday patient is to continue Cipro po post urological procedure d/t patient doing I & O cath at home.

## 2011-03-06 ENCOUNTER — Encounter (HOSPITAL_COMMUNITY): Payer: Self-pay | Admitting: Obstetrics and Gynecology

## 2011-03-09 ENCOUNTER — Encounter (HOSPITAL_COMMUNITY): Payer: Self-pay | Admitting: Obstetrics and Gynecology

## 2011-03-09 ENCOUNTER — Inpatient Hospital Stay (HOSPITAL_COMMUNITY)
Admission: AD | Admit: 2011-03-09 | Discharge: 2011-03-09 | Disposition: A | Discharge status: Home / Self Care | Payer: PRIVATE HEALTH INSURANCE | Source: Ambulatory Visit | Attending: Obstetrics and Gynecology | Admitting: Obstetrics and Gynecology

## 2011-03-09 DIAGNOSIS — R109 Unspecified abdominal pain: Secondary | ICD-10-CM | POA: Insufficient documentation

## 2011-03-09 DIAGNOSIS — K5900 Constipation, unspecified: Secondary | ICD-10-CM | POA: Insufficient documentation

## 2011-03-09 LAB — CBC
HCT: 33.8 % — ABNORMAL LOW (ref 36.0–46.0)
Hemoglobin: 11.5 g/dL — ABNORMAL LOW (ref 12.0–15.0)
WBC: 9.5 10*3/uL (ref 4.0–10.5)

## 2011-03-09 MED ORDER — FLEET ENEMA 7-19 GM/118ML RE ENEM
1.0000 | ENEMA | Freq: Once | RECTAL | Status: AC
  Administered 2011-03-09: 1 via RECTAL

## 2011-03-09 MED ORDER — KETOROLAC TROMETHAMINE 60 MG/2ML IM SOLN
60.0000 mg | Freq: Once | INTRAMUSCULAR | Status: AC
  Administered 2011-03-09: 60 mg via INTRAMUSCULAR
  Filled 2011-03-09: qty 2

## 2011-03-09 NOTE — ED Notes (Signed)
Patient taken directly to room 4 via wheelchair from lobby by Venia Carbon RN

## 2011-03-09 NOTE — ED Provider Notes (Signed)
History     Chief Complaint  Patient presents with  . Abdominal Pain  . Post-op Problem   HPI [name] 45 y.o. comes to [facility]U with lower abdominal pain worsening since yesterday.  Took Dilaudid at 4 am and 8 am and has not had any relief.  History of laparoscopic hysterectomy by Dr. Vincente Poli and cystocele repair by Dr. Sherron Monday on 03-04-11.   OB History    Grav Para Term Preterm Abortions TAB SAB Ect Mult Living   3 3   0 0 0 0 0 3      Past Medical History  Diagnosis Date  . Abdominal pain   . Diabetes mellitus     states her doctor took her off all DM meds in past month  . COPD (chronic obstructive pulmonary disease)   . GERD (gastroesophageal reflux disease)   . Chronic nausea   . Hypothyroidism   . Mental disorder   . Depression     bipolar and schizophrenic  . Headache   . Anxiety     Past Surgical History  Procedure Date  . Laparoscopic assisted vaginal hysterectomy 03/04/2011    Procedure: LAPAROSCOPIC ASSISTED VAGINAL HYSTERECTOMY;  Surgeon: Jeani Hawking, MD;  Location: [facility];  Service: Gynecology;  Laterality: N/A;  . Cystocele repair 03/04/2011    Procedure: ANTERIOR REPAIR (CYSTOCELE);  Surgeon: Martina Sinner;  Location: [facility];  Service: Urology;  Laterality: N/A;  . Bladder suspension 03/04/2011    Procedure: SPARC PROCEDURE;  Surgeon: Jonna Coup MacDiarmid;  Location: [facility];  Service: Urology;  Laterality: N/A;  . Cystoscopy 03/04/2011    Procedure: CYSTOSCOPY;  Surgeon: Jonna Coup MacDiarmid;  Location: [facility];  Service: Urology;  Laterality: N/A;    No family history on file.  History  Substance Use Topics  . Smoking status: Current Everyday Smoker -- 3.0 packs/day    Types: Cigarettes  . Smokeless tobacco: Not on file  . Alcohol Use: No    Allergies:  Allergies  Allergen Reactions  . Aspirin Nausea And Vomiting    Does fine with ibuprofen    Prescriptions prior to admission  Medication Sig Dispense Refill  . albuterol (PROVENTIL  HFA;VENTOLIN HFA) 108 (90 BASE) MCG/ACT inhaler Inhale 2 puffs into the lungs every 6 (six) hours as needed. For wheezing       . butalbital-acetaminophen-caffeine (FIORICET, ESGIC) 50-325-40 MG per tablet Take 1 tablet by mouth every 6 (six) hours as needed. For headache        . ciprofloxacin (CIPRO) 250 MG tablet Take 1 tablet (250 mg total) by mouth 2 (two) times daily.  14 tablet  0  . clonazePAM (KLONOPIN) 0.5 MG tablet Take 0.5-1 mg by mouth 3 (three) times daily. Patient takes 0.5mg  at 0800, 0.5mg  at 1200, and 1mg  at bedtime      . DULoxetine (CYMBALTA) 30 MG capsule Take 90 mg by mouth at bedtime.        . gabapentin (NEURONTIN) 300 MG capsule Take 300 mg by mouth 3 (three) times daily.        . hydrOXYzine (ATARAX) 25 MG tablet Take 25 mg by mouth 2 (two) times daily.        Marland Kitchen ibuprofen (ADVIL,MOTRIN) 800 MG tablet Take 800 mg by mouth 3 (three) times daily as needed. For back pain or headache       . levothyroxine (SYNTHROID, LEVOTHROID) 50 MCG tablet Take 50 mcg by mouth daily.        Marland Kitchen  lurasidone (LATUDA) 40 MG TABS Take by mouth daily with breakfast.        . oxyCODONE (OXY IR/ROXICODONE) 5 MG immediate release tablet Take 10 mg by mouth every 4 (four) hours as needed. For breakthrough back pain       . oxyCODONE-acetaminophen (PERCOCET) 10-325 MG per tablet Take 1 tablet by mouth every 4 (four) hours as needed for pain.  60 tablet  0  . oxymorphone (OPANA ER) 40 MG 12 hr tablet Take 40 mg by mouth every 8 (eight) hours. For back pain       . simvastatin (ZOCOR) 20 MG tablet Take 20 mg by mouth daily.        . SUMAtriptan (IMITREX) 6 MG/0.5ML SOLN Inject 6 mg into the skin once as needed. If needed for headache        . traZODone (DESYREL) 100 MG tablet Take 200 mg by mouth at bedtime.          Review of Systems  Gastrointestinal: Positive for abdominal pain and constipation.   Physical Exam   Blood pressure 134/84, temperature 98.3 F (36.8 C), temperature source Oral, resp.  rate 20, last menstrual period 01/08/2011.  Physical Exam  Nursing note and vitals reviewed. Constitutional: She is oriented to person, place, and time. She appears well-developed and well-nourished.  HENT:  Head: Normocephalic.  Eyes: EOM are normal.  Neck: Neck supple.  GI: Soft. Bowel sounds are normal. There is tenderness. There is guarding. There is no rebound.       Seems tight but also difficult to have client relax fully  Genitourinary:       Foley in place.  Leg bag attached. Urine draining.  Musculoskeletal: Normal range of motion.  Neurological: She is alert and oriented to person, place, and time.  Skin: Skin is warm and dry.  Psychiatric: She has a normal mood and affect.    MAU Course  Procedures  MDM 1017  Consult with Dr. Vincente Poli.  Discussed plan of care.  Results for orders placed during the hospital encounter of 03/09/11 (from the past 24 hour(s))  CBC     Status: Abnormal   Collection Time   03/09/11 10:19 AM      Component Value Range   WBC 9.5  4.0 - 10.5 (K/uL)   RBC 3.91  3.87 - 5.11 (MIL/uL)   Hemoglobin 11.5 (*) 12.0 - 15.0 (g/dL)   HCT 16.1 (*) 09.6 - 46.0 (%)   MCV 86.4  78.0 - 100.0 (fL)   MCH 29.4  26.0 - 34.0 (pg)   MCHC 34.0  30.0 - 36.0 (g/dL)   RDW 04.5  40.9 - 81.1 (%)   Platelets 182  150 - 400 (K/uL)   Toradol 60 mg IM for pain - relieved client's pain. Fleets Enema for constipation - not able to tolerate very much of enema.  Poor results here.  Client states she has not eaten in the last 4 days.  Assessment and Plan  Abdominal pain Constipation  Plan: Begin stool softener Eat small amounts of food Decrease narcotic pain meds Use Ibuprofen 800 mg with food every 8 hours and supplement with narcotic medication if needed. Keep your appointment with your doctor tomorrow. Call your doctor if you have questions or concerns.  Bryant Lipps 03/09/2011, 10:17 AM   Nolene Bernheim, NP 03/09/11 1226

## 2011-03-09 NOTE — Progress Notes (Signed)
Pt presents to MAU with complaints of abdominal pain, post op day 6- hysterectomy, sling, ( pt unsure of all the procedures done). Pt is in excruciating pain. No fever.

## 2011-03-14 LAB — URINE CULTURE: Culture: NO GROWTH

## 2011-03-14 LAB — DIFFERENTIAL
Basophils Absolute: 0
Eosinophils Absolute: 0.1
Eosinophils Relative: 0
Lymphs Abs: 1.3
Neutrophils Relative %: 87 — ABNORMAL HIGH

## 2011-03-14 LAB — COMPREHENSIVE METABOLIC PANEL
ALT: 15
AST: 15
CO2: 27
Calcium: 8.7
Chloride: 99
Creatinine, Ser: 0.82
GFR calc non Af Amer: >60
Glucose, Bld: 155 — ABNORMAL HIGH
Sodium: 138
Total Bilirubin: 1.3 — ABNORMAL HIGH

## 2011-03-14 LAB — CBC
Hemoglobin: 13.6
MCHC: 34.1
MCV: 86.8
RBC: 4.58
WBC: 13 — ABNORMAL HIGH

## 2011-03-14 LAB — URINALYSIS, ROUTINE W REFLEX MICROSCOPIC
Bilirubin Urine: NEGATIVE
Glucose, UA: NEGATIVE
Hgb urine dipstick: NEGATIVE
Specific Gravity, Urine: 1.027
pH: 5.5

## 2011-03-14 LAB — LIPASE, BLOOD: Lipase: 24

## 2011-03-17 LAB — I-STAT 8, (EC8 V) (CONVERTED LAB)
Acid-Base Excess: 3 — ABNORMAL HIGH
Bicarbonate: 29.7 — ABNORMAL HIGH
Chloride: 102
HCT: 44
Hemoglobin: 15
Operator id: 272551
Sodium: 135
pCO2, Ven: 50.6 — ABNORMAL HIGH

## 2011-03-17 LAB — PROTIME-INR: INR: 1

## 2011-03-17 LAB — CBC
HCT: 39.1
Hemoglobin: 13.3
MCHC: 34.1
MCV: 86.6
RBC: 4.52
WBC: 9.3

## 2011-03-17 LAB — DIFFERENTIAL
Basophils Relative: 0
Eosinophils Absolute: 0.3
Eosinophils Relative: 3
Lymphs Abs: 3.2
Monocytes Absolute: 0.5
Monocytes Relative: 6
Neutrophils Relative %: 57

## 2011-03-17 LAB — POCT I-STAT CREATININE: Creatinine, Ser: 1

## 2011-03-18 NOTE — Op Note (Signed)
[name], White NO.:  [id number]  MEDICAL RECORD NO.:  [id number]  LOCATION:  9305                          FACILITY:  [facility]  PHYSICIAN:  Martina Sinner, MD DATE OF BIRTH:  02/07/1966  DATE OF PROCEDURE: DATE OF DISCHARGE:                              OPERATIVE REPORT   ASSISTANT:  Delia Chimes, NP  PREOPERATIVE DIAGNOSES:  Cystocele and stress urinary incontinence and mild uterine prolapse.  POSTOPERATIVE DIAGNOSES:  Cystocele and stress urinary incontinence and mild uterine prolapse.  SURGERY:  Cystocele repair plus sling cystourethropexy [facility]) plus cystoscopy.  [name] had a hypermobility anterior vaginal wall with a grade 2 cystocele and mild loss of uterine support.  She underwent laparoscopic-assisted transvaginal hysterectomy by Dr. Vincente Poli and the cuff was closed when I came into the case.  Blood loss was minimal.  Leg position was good.  Preoperative laboratory tests were within normal limits and antibiotics were given.  She had a short anterior vaginal wall with the cuff closed.  The cystocele was smaller, but still based upon her hypermobility and the size of cystocele under anesthesia, it was clear that she needed an anterior repair.  Because of the short anterior vaginal wall length, I decided to use my usual T-shaped anterior vaginal wall incision starting at the cuff, but extended to the distal urethra for the sling.  I sharply dissected the underlying pubocervical fascia not quite to the white line bilaterally from the overlying vaginal wall mucosa.  The incision I made suburethrally was thicker because of a sling to be placed and I dissected to the urethrovesical angle bilaterally.  Without imbricating the bladder neck, I did a 2-layer anterior repair of 2-0 Vicryl.  This went very well.  I was very pleased with the repair.  I cystoscoped the patient.  There was nice reduction of the cystocele cystoscopically and  there were excellent blue jets bilaterally and no injury to bladder.  At this point, I made two 1-cm incisions 1 fingerbreadth above the symphysis pubis, 1.5 cm lateral to the midline.  With the bladder emptied, I passed a SPARC needle on top of along the back of the symphysis pubis, staying lateral using my box technique and bringing the needles on to the pulp of my index finger bilaterally.  I repeated cystoscopy.  There was no injury to bladder or urethra and there were excellent blue jets bilaterally.  There was no movement of bladder with movement of trocar.  I had a very good look at the dome as well.  With the bladder empted, I attached the [facility] sling and brought up through the retropubic space.  I tensioned over the fat part of a moderate-sized Kelly clamp, after I released my blue stays to make certain the anatomy was in anatomic position.  I cut below the blue dot, irrigated the sheaths, and removed the sheaths.  I was very happy with the position and tension of the sling.  There was appropriate hypermobility with no springback effect.  Now surprisingly, the sling was a little bit proximal, which is the case when the midurethral incision alone is not performed.  I did the best I  can to bring the sling into the mid urethra or underlying the mid urethra as much as possible.  Irrigation with antibiotic was utilized.  I trimmed to minimal an appropriate amount of anterior vaginal wall mucosa and closed the anterior vaginal wall all the way to the apex with 2-0 Vicryl on a CT-1 needle.  At the end of the case, leg position was good.  There was excellent length and no narrowing of vagina.  There was excellent reduction of cystocele.  A vaginal pack was inserted with Estrace cream.  I cut the sling below level of the skin and closed the 2 abdominal incisions with interrupted 4-0 Vicryl followed by Dermabond.  I was very pleased with [name]' surgery.  Blood loss  was approximately 50 mL or less.  Hopefully, I reach her treatment goal. She does have somewhat complicated voiding dysfunction.          ______________________________ Martina Sinner, MD     SAM/MEDQ  D:  03/04/2011  T:  03/04/2011  Job:  [id number]  Electronically Signed by Alfredo Martinez MD on 03/18/2011 09:48:48 AM

## 2011-03-20 LAB — URINALYSIS, ROUTINE W REFLEX MICROSCOPIC
Glucose, UA: NEGATIVE
Ketones, ur: NEGATIVE
Protein, ur: 30 — AB
Urobilinogen, UA: 0.2

## 2011-03-20 LAB — URINE MICROSCOPIC-ADD ON

## 2011-07-15 ENCOUNTER — Ambulatory Visit
Payer: PRIVATE HEALTH INSURANCE | Attending: Physical Medicine and Rehabilitation | Admitting: Rehabilitative and Restorative Service Providers"

## 2011-07-15 DIAGNOSIS — IMO0001 Reserved for inherently not codable concepts without codable children: Secondary | ICD-10-CM | POA: Insufficient documentation

## 2011-07-15 DIAGNOSIS — R262 Difficulty in walking, not elsewhere classified: Secondary | ICD-10-CM | POA: Insufficient documentation

## 2011-07-15 DIAGNOSIS — M545 Low back pain, unspecified: Secondary | ICD-10-CM | POA: Insufficient documentation

## 2011-07-22 ENCOUNTER — Encounter: Payer: PRIVATE HEALTH INSURANCE | Admitting: Rehabilitative and Restorative Service Providers"

## 2011-07-29 ENCOUNTER — Encounter: Payer: PRIVATE HEALTH INSURANCE | Admitting: Rehabilitative and Restorative Service Providers"

## 2011-08-05 ENCOUNTER — Encounter: Payer: PRIVATE HEALTH INSURANCE | Admitting: Rehabilitative and Restorative Service Providers"

## 2011-09-09 ENCOUNTER — Encounter (HOSPITAL_COMMUNITY): Payer: Self-pay

## 2011-09-09 ENCOUNTER — Emergency Department (HOSPITAL_COMMUNITY)
Admission: EM | Admit: 2011-09-09 | Discharge: 2011-09-10 | Disposition: A | Discharge status: Home / Self Care | Payer: PRIVATE HEALTH INSURANCE | Attending: Emergency Medicine | Admitting: Emergency Medicine

## 2011-09-09 DIAGNOSIS — J449 Chronic obstructive pulmonary disease, unspecified: Secondary | ICD-10-CM | POA: Insufficient documentation

## 2011-09-09 DIAGNOSIS — K219 Gastro-esophageal reflux disease without esophagitis: Secondary | ICD-10-CM | POA: Insufficient documentation

## 2011-09-09 DIAGNOSIS — M545 Low back pain, unspecified: Secondary | ICD-10-CM | POA: Insufficient documentation

## 2011-09-09 DIAGNOSIS — J4489 Other specified chronic obstructive pulmonary disease: Secondary | ICD-10-CM | POA: Insufficient documentation

## 2011-09-09 DIAGNOSIS — M79609 Pain in unspecified limb: Secondary | ICD-10-CM | POA: Insufficient documentation

## 2011-09-09 DIAGNOSIS — E119 Type 2 diabetes mellitus without complications: Secondary | ICD-10-CM | POA: Insufficient documentation

## 2011-09-09 DIAGNOSIS — E039 Hypothyroidism, unspecified: Secondary | ICD-10-CM | POA: Insufficient documentation

## 2011-09-09 MED ORDER — HYDROMORPHONE HCL PF 1 MG/ML IJ SOLN
1.0000 mg | Freq: Once | INTRAMUSCULAR | Status: AC
  Administered 2011-09-09: 1 mg via INTRAMUSCULAR
  Filled 2011-09-09: qty 1

## 2011-09-09 MED ORDER — IBUPROFEN 800 MG PO TABS
800.0000 mg | ORAL_TABLET | Freq: Once | ORAL | Status: AC
  Administered 2011-09-09: 800 mg via ORAL
  Filled 2011-09-09: qty 1

## 2011-09-09 MED ORDER — DIAZEPAM 5 MG PO TABS
5.0000 mg | ORAL_TABLET | Freq: Once | ORAL | Status: AC
  Administered 2011-09-09: 5 mg via ORAL
  Filled 2011-09-09: qty 1

## 2011-09-09 NOTE — ED Notes (Signed)
Patient arrives with c/o lower back pain, 10/10 since this afternoon. Reports receiving a shot in her back this AM and she states "it feels like it's wearing off". Denies injury. +PNS

## 2011-09-09 NOTE — ED Notes (Signed)
Pt has chronic back pain from an injury in 2005, this am she received steroid injections from Dr Percell Boston at [facility], she states that she remains in severe pain and feels nauseated

## 2011-09-10 MED ORDER — HYDROMORPHONE HCL PF 1 MG/ML IJ SOLN
1.0000 mg | Freq: Once | INTRAMUSCULAR | Status: AC
  Administered 2011-09-10: 1 mg via INTRAMUSCULAR
  Filled 2011-09-10: qty 1

## 2011-09-10 MED ORDER — IBUPROFEN 800 MG PO TABS: 800.0000 mg | ORAL_TABLET | Freq: Three times a day (TID) | ORAL | Status: AC

## 2011-09-10 MED ORDER — CYCLOBENZAPRINE HCL 10 MG PO TABS: 10.0000 mg | ORAL_TABLET | Freq: Two times a day (BID) | ORAL | Status: AC | PRN

## 2011-09-10 MED ORDER — OXYCODONE-ACETAMINOPHEN 5-325 MG PO TABS: 2.0000 | ORAL_TABLET | ORAL | Status: AC | PRN

## 2011-09-10 NOTE — Discharge Instructions (Signed)

## 2011-09-10 NOTE — ED Provider Notes (Signed)
History     CSN: [id number]  Arrival date & time 09/09/11  2004   First MD Initiated Contact with Patient 09/09/11 2317      Chief Complaint  Patient presents with  . Back Pain    (Consider location/radiation/quality/duration/timing/severity/associated sxs/prior treatment) HPI Low back pain with history of chronic back pain since 2005. Patient receives regular lumbar injections and have her scheduled injection today at [facility]. Injections typically do help her but today it did not and she is complaining of persistent severe pain. Pain does radiate to her left thigh which is typical for her. No numbness or tingling. It hurts to walk. No alleviating factors. Worsened by movement and position. No incontinence. Patient has had 3 MRIs in the past for this chronic pain. Sharp in quality. Otter to severe. Past Medical History  Diagnosis Date  . Abdominal pain   . Diabetes mellitus     states her doctor took her off all DM meds in past month  . COPD (chronic obstructive pulmonary disease)   . GERD (gastroesophageal reflux disease)   . Chronic nausea   . Hypothyroidism   . Mental disorder   . Depression     bipolar and schizophrenic  . Headache   . Anxiety     Past Surgical History  Procedure Date  . Laparoscopic assisted vaginal hysterectomy 03/04/2011    Procedure: LAPAROSCOPIC ASSISTED VAGINAL HYSTERECTOMY;  Surgeon: Jeani Hawking, MD;  Location: [facility];  Service: Gynecology;  Laterality: N/A;  . Cystocele repair 03/04/2011    Procedure: ANTERIOR REPAIR (CYSTOCELE);  Surgeon: Martina Sinner;  Location: [facility];  Service: Urology;  Laterality: N/A;  . Bladder suspension 03/04/2011    Procedure: SPARC PROCEDURE;  Surgeon: Jonna Coup MacDiarmid;  Location: [facility];  Service: Urology;  Laterality: N/A;  . Cystoscopy 03/04/2011    Procedure: CYSTOSCOPY;  Surgeon: Jonna Coup MacDiarmid;  Location: [facility];  Service: Urology;  Laterality: N/A;    History  reviewed. No pertinent family history.  History  Substance Use Topics  . Smoking status: Current Everyday Smoker -- 3.0 packs/day    Types: Cigarettes  . Smokeless tobacco: Not on file  . Alcohol Use: No    OB History    Grav Para Term Preterm Abortions TAB SAB Ect Mult Living   3 3   0 0 0 0 0 3      Review of Systems  Constitutional: Negative for fever and chills.  HENT: Negative for neck pain and neck stiffness.   Eyes: Negative for pain.  Respiratory: Negative for shortness of breath.   Cardiovascular: Negative for chest pain.  Gastrointestinal: Negative for abdominal pain.  Genitourinary: Negative for dysuria.  Musculoskeletal: Positive for back pain.  Skin: Negative for rash.  Neurological: Negative for headaches.  All other systems reviewed and are negative.    Allergies  Aspirin  Home Medications   Current Outpatient Rx  Name Route Sig Dispense Refill  . ALBUTEROL SULFATE HFA 108 (90 BASE) MCG/ACT IN AERS Inhalation Inhale 2 puffs into the lungs every 6 (six) hours as needed. For wheezing     . BUTALBITAL-APAP-CAFFEINE 50-325-40 MG PO TABS Oral Take 1 tablet by mouth every 6 (six) hours as needed. For headache      . CLONAZEPAM 0.5 MG PO TABS Oral Take 1.5 mg by mouth at bedtime and may repeat dose one time if needed. For anxiety    . CYCLOBENZAPRINE HCL 10 MG PO TABS Oral Take  10 mg by mouth at bedtime as needed. For sleep    . DULOXETINE HCL 30 MG PO CPEP Oral Take 90 mg by mouth at bedtime.      . ECONAZOLE NITRATE 1 % EX CREA Topical Apply 1 application topically 2 (two) times daily. To feet    . GABAPENTIN 300 MG PO CAPS Oral Take 300 mg by mouth 3 (three) times daily.      Marland Kitchen HYDROXYZINE HCL 25 MG PO TABS Oral Take 25 mg by mouth 2 (two) times daily.      Marland Kitchen LEVOTHYROXINE SODIUM 50 MCG PO TABS Oral Take 50 mcg by mouth daily.      Marland Kitchen LISINOPRIL-HYDROCHLOROTHIAZIDE 10-12.5 MG PO TABS Oral Take 1 tablet by mouth every morning.    Marland Kitchen LURASIDONE HCL 40 MG PO  TABS Oral Take by mouth daily with breakfast.      . METAXALONE 800 MG PO TABS Oral Take 400-800 mg by mouth 4 (four) times daily as needed. For Muscle spasms    . MIRABEGRON ER 25 MG PO TB24 Oral Take 1 tablet by mouth daily.    . OXYCODONE HCL 5 MG PO TABS Oral Take 10 mg by mouth every 8 (eight) hours as needed. For breakthrough back pain    . OXYMORPHONE HCL ER 40 MG PO TB12 Oral Take 40 mg by mouth every 8 (eight) hours. For back pain     . PROMETHAZINE HCL 25 MG PO TABS Oral Take 25 mg by mouth every 6 (six) hours as needed. For nausea.     Marland Kitchen SIMVASTATIN 20 MG PO TABS Oral Take 20 mg by mouth daily.      . SUMATRIPTAN SUCCINATE 6 MG/0.5ML [place] SOLN Subcutaneous Inject 6 mg into the skin once as needed. If needed for headache      . TIOTROPIUM BROMIDE MONOHYDRATE 18 MCG IN CAPS Inhalation Place 18 mcg into inhaler and inhale daily as needed.    . TRAZODONE HCL 100 MG PO TABS Oral Take 200 mg by mouth at bedtime.        BP 135/66  Pulse 95  Temp(Src) 98.6 F (37 C) (Oral)  Resp 20  SpO2 95%  LMP 01/08/2011  Physical Exam  Constitutional: She is oriented to person, place, and time. She appears well-developed and well-nourished.  HENT:  Head: Normocephalic and atraumatic.  Eyes: Conjunctivae and EOM are normal. Pupils are equal, round, and reactive to light.  Neck: Trachea normal. Neck supple. No thyromegaly present.  Cardiovascular: Normal rate, regular rhythm, S1 normal, S2 normal and normal pulses.     No systolic murmur is present   No diastolic murmur is present  Pulses:      Radial pulses are 2+ on the right side, and 2+ on the left side.  Pulmonary/Chest: Effort normal and breath sounds normal. She has no wheezes. She has no rhonchi. She has no rales. She exhibits no tenderness.  Abdominal: Soft. Normal appearance and bowel sounds are normal. There is no tenderness. There is no CVA tenderness and negative Murphy's sign.  Musculoskeletal:       Tender over lower lumbar  region and paralumbar. No erythema, swelling or fluctuance. No lower extremity deficits with equal and intact strengths, and sore and light touch throughout and equal DTRs. Gait intact  Neurological: She is alert and oriented to person, place, and time. She has normal strength. No cranial nerve deficit or sensory deficit. GCS eye subscore is 4. GCS verbal subscore is 5. GCS  motor subscore is 6.  Skin: Skin is warm and dry. No rash noted. She is not diaphoretic.  Psychiatric: Her speech is normal.       Cooperative and appropriate    ED Course  Procedures (including critical care time)  Dilaudid Valium and Motrin ice   MDM   Chronic LBP with acute exacerbation of the same. Improved with IM Dilaudid,  Valium and Motrin and ice. Serial evaluations without neuro deficits. No indication for emergent MRI at this time. Plan close primary care followup. Reliable historian and agrees to low back pain and strict return precautions.        Sunnie Nielsen, MD 09/10/11 [phone]

## 2011-09-22 DIAGNOSIS — T50901A Poisoning by unspecified drugs, medicaments and biological substances, accidental (unintentional), initial encounter: Secondary | ICD-10-CM

## 2011-09-22 HISTORY — DX: Poisoning by unspecified drugs, medicaments and biological substances, accidental (unintentional), initial encounter: T50.901A

## 2011-09-28 ENCOUNTER — Emergency Department (HOSPITAL_COMMUNITY): Payer: PRIVATE HEALTH INSURANCE

## 2011-09-28 ENCOUNTER — Other Ambulatory Visit: Payer: Self-pay

## 2011-09-28 ENCOUNTER — Inpatient Hospital Stay (HOSPITAL_COMMUNITY)
Admission: EM | Admit: 2011-09-28 | Discharge: 2011-10-02 | DRG: 917 | Disposition: A | Discharge status: Home / Self Care | Payer: PRIVATE HEALTH INSURANCE | Attending: Internal Medicine | Admitting: Internal Medicine

## 2011-09-28 ENCOUNTER — Encounter (HOSPITAL_COMMUNITY): Payer: Self-pay | Admitting: *Deleted

## 2011-09-28 DIAGNOSIS — E119 Type 2 diabetes mellitus without complications: Secondary | ICD-10-CM | POA: Diagnosis present

## 2011-09-28 DIAGNOSIS — R4182 Altered mental status, unspecified: Secondary | ICD-10-CM

## 2011-09-28 DIAGNOSIS — F319 Bipolar disorder, unspecified: Secondary | ICD-10-CM | POA: Diagnosis present

## 2011-09-28 DIAGNOSIS — I959 Hypotension, unspecified: Secondary | ICD-10-CM | POA: Diagnosis present

## 2011-09-28 DIAGNOSIS — I4891 Unspecified atrial fibrillation: Secondary | ICD-10-CM | POA: Diagnosis not present

## 2011-09-28 DIAGNOSIS — N179 Acute kidney failure, unspecified: Secondary | ICD-10-CM | POA: Diagnosis present

## 2011-09-28 DIAGNOSIS — J449 Chronic obstructive pulmonary disease, unspecified: Secondary | ICD-10-CM | POA: Diagnosis present

## 2011-09-28 DIAGNOSIS — J441 Chronic obstructive pulmonary disease with (acute) exacerbation: Secondary | ICD-10-CM | POA: Diagnosis present

## 2011-09-28 DIAGNOSIS — E872 Acidosis: Secondary | ICD-10-CM

## 2011-09-28 DIAGNOSIS — T50901A Poisoning by unspecified drugs, medicaments and biological substances, accidental (unintentional), initial encounter: Principal | ICD-10-CM | POA: Diagnosis present

## 2011-09-28 DIAGNOSIS — R4020 Unspecified coma: Secondary | ICD-10-CM | POA: Diagnosis present

## 2011-09-28 DIAGNOSIS — J9600 Acute respiratory failure, unspecified whether with hypoxia or hypercapnia: Secondary | ICD-10-CM | POA: Diagnosis present

## 2011-09-28 DIAGNOSIS — I480 Paroxysmal atrial fibrillation: Secondary | ICD-10-CM | POA: Diagnosis not present

## 2011-09-28 DIAGNOSIS — F17200 Nicotine dependence, unspecified, uncomplicated: Secondary | ICD-10-CM | POA: Diagnosis present

## 2011-09-28 DIAGNOSIS — F209 Schizophrenia, unspecified: Secondary | ICD-10-CM | POA: Diagnosis present

## 2011-09-28 DIAGNOSIS — E876 Hypokalemia: Secondary | ICD-10-CM | POA: Diagnosis present

## 2011-09-28 DIAGNOSIS — D72829 Elevated white blood cell count, unspecified: Secondary | ICD-10-CM | POA: Diagnosis present

## 2011-09-28 DIAGNOSIS — F411 Generalized anxiety disorder: Secondary | ICD-10-CM | POA: Diagnosis present

## 2011-09-28 DIAGNOSIS — E039 Hypothyroidism, unspecified: Secondary | ICD-10-CM | POA: Diagnosis present

## 2011-09-28 DIAGNOSIS — J4489 Other specified chronic obstructive pulmonary disease: Secondary | ICD-10-CM | POA: Diagnosis present

## 2011-09-28 HISTORY — DX: Unspecified atrial fibrillation: I48.91

## 2011-09-28 LAB — GLUCOSE, CAPILLARY: Glucose-Capillary: 144 mg/dL — ABNORMAL HIGH (ref 70–99)

## 2011-09-28 LAB — URINALYSIS, ROUTINE W REFLEX MICROSCOPIC
Glucose, UA: NEGATIVE mg/dL
Ketones, ur: NEGATIVE mg/dL
Protein, ur: NEGATIVE mg/dL

## 2011-09-28 LAB — MRSA PCR SCREENING: MRSA by PCR: NEGATIVE

## 2011-09-28 LAB — CARDIAC PANEL(CRET KIN+CKTOT+MB+TROPI)
Relative Index: 2 (ref 0.0–2.5)
Troponin I: <0.3 ng/mL (ref <0.30)

## 2011-09-28 LAB — BLOOD GAS, ARTERIAL
Bicarbonate: 24.9 mEq/L — ABNORMAL HIGH (ref 20.0–24.0)
Drawn by: 103701
FIO2: 1 %
O2 Saturation: 98.2 %
O2 Saturation: 97.3 %
Patient temperature: 98.6
Patient temperature: 98.6
TCO2: 23 mmol/L (ref 0–100)

## 2011-09-28 LAB — CBC
MCH: 29 pg (ref 26.0–34.0)
MCV: 89.1 fL (ref 78.0–100.0)
Platelets: 229 10*3/uL (ref 150–400)
RDW: 13.3 % (ref 11.5–15.5)

## 2011-09-28 LAB — DIFFERENTIAL
Basophils Absolute: 0 10*3/uL (ref 0.0–0.1)
Eosinophils Absolute: 0 10*3/uL (ref 0.0–0.7)
Eosinophils Relative: 0 % (ref 0–5)
Lymphs Abs: 1.4 10*3/uL (ref 0.7–4.0)
Monocytes Absolute: 0.5 10*3/uL (ref 0.1–1.0)

## 2011-09-28 LAB — URINE MICROSCOPIC-ADD ON: Urine-Other: NONE SEEN

## 2011-09-28 LAB — COMPREHENSIVE METABOLIC PANEL
ALT: 16 U/L (ref 0–35)
Calcium: 9.1 mg/dL (ref 8.4–10.5)
Creatinine, Ser: 1.32 mg/dL — ABNORMAL HIGH (ref 0.50–1.10)
GFR calc Af Amer: 55 mL/min — ABNORMAL LOW (ref >90)
Glucose, Bld: 141 mg/dL — ABNORMAL HIGH (ref 70–99)
Sodium: 138 mEq/L (ref 135–145)
Total Protein: 7.3 g/dL (ref 6.0–8.3)

## 2011-09-28 LAB — APTT: aPTT: 27 seconds (ref 24–37)

## 2011-09-28 LAB — PRO B NATRIURETIC PEPTIDE: Pro B Natriuretic peptide (BNP): 96.3 pg/mL (ref 0–125)

## 2011-09-28 LAB — RAPID URINE DRUG SCREEN, HOSP PERFORMED
Amphetamines: NOT DETECTED
Benzodiazepines: NOT DETECTED
Cocaine: NOT DETECTED

## 2011-09-28 LAB — ETHANOL: Alcohol, Ethyl (B): <11 mg/dL (ref 0–11)

## 2011-09-28 LAB — ACETAMINOPHEN LEVEL: Acetaminophen (Tylenol), Serum: <15 ug/mL (ref 10–30)

## 2011-09-28 LAB — MAGNESIUM: Magnesium: 2.6 mg/dL — ABNORMAL HIGH (ref 1.5–2.5)

## 2011-09-28 LAB — AMYLASE: Amylase: 71 U/L (ref 0–105)

## 2011-09-28 MED ORDER — PANTOPRAZOLE SODIUM 40 MG IV SOLR
40.0000 mg | Freq: Once | INTRAVENOUS | Status: AC
  Administered 2011-09-28: 40 mg via INTRAVENOUS
  Filled 2011-09-28: qty 40

## 2011-09-28 MED ORDER — MIDAZOLAM HCL 2 MG/2ML IJ SOLN: 2.0000 mg | INTRAMUSCULAR | Status: DC | PRN

## 2011-09-28 MED ORDER — PROPOFOL 10 MG/ML IV EMUL
INTRAVENOUS | Status: AC
  Administered 2011-09-28: 5 ug/min
  Filled 2011-09-28: qty 100

## 2011-09-28 MED ORDER — SUCCINYLCHOLINE CHLORIDE 20 MG/ML IJ SOLN
INTRAMUSCULAR | Status: AC
  Filled 2011-09-28: qty 5

## 2011-09-28 MED ORDER — MORPHINE SULFATE 4 MG/ML IJ SOLN
4.0000 mg | INTRAMUSCULAR | Status: DC | PRN
  Administered 2011-09-28 – 2011-09-29 (×3): 4 mg via INTRAVENOUS
  Filled 2011-09-28 (×3): qty 1

## 2011-09-28 MED ORDER — ALBUTEROL SULFATE HFA 108 (90 BASE) MCG/ACT IN AERS
4.0000 | INHALATION_SPRAY | RESPIRATORY_TRACT | Status: DC | PRN
  Administered 2011-09-28 – 2011-09-29 (×5): 4 via RESPIRATORY_TRACT
  Filled 2011-09-28: qty 6.7

## 2011-09-28 MED ORDER — DEXTROSE 5 % IV SOLN
500.0000 mg | INTRAVENOUS | Status: DC
  Administered 2011-09-28 – 2011-09-29 (×2): 500 mg via INTRAVENOUS
  Filled 2011-09-28 (×3): qty 500

## 2011-09-28 MED ORDER — SODIUM CHLORIDE 0.9 % IV SOLN
3.0000 g | Freq: Four times a day (QID) | INTRAVENOUS | Status: DC
  Administered 2011-09-28 – 2011-09-30 (×8): 3 g via INTRAVENOUS
  Filled 2011-09-28 (×10): qty 3

## 2011-09-28 MED ORDER — CHLORHEXIDINE GLUCONATE 0.12 % MT SOLN
15.0000 mL | Freq: Two times a day (BID) | OROMUCOSAL | Status: DC
  Administered 2011-09-28: 15 mL via OROMUCOSAL
  Filled 2011-09-28 (×3): qty 15

## 2011-09-28 MED ORDER — PREDNISONE 20 MG PO TABS
40.0000 mg | ORAL_TABLET | Freq: Every day | ORAL | Status: DC
  Administered 2011-09-29 – 2011-09-30 (×2): 40 mg via ORAL
  Filled 2011-09-28 (×2): qty 2

## 2011-09-28 MED ORDER — BIOTENE DRY MOUTH MT LIQD
15.0000 mL | Freq: Four times a day (QID) | OROMUCOSAL | Status: DC
  Administered 2011-09-29 (×2): 15 mL via OROMUCOSAL

## 2011-09-28 MED ORDER — SODIUM CHLORIDE 0.9 % IV SOLN
INTRAVENOUS | Status: DC
  Administered 2011-09-28 – 2011-10-01 (×4): via INTRAVENOUS

## 2011-09-28 MED ORDER — MIDAZOLAM HCL 2 MG/2ML IJ SOLN
2.0000 mg | Freq: Once | INTRAMUSCULAR | Status: AC
Start: 2011-09-28 — End: 2011-09-28
  Administered 2011-09-28: 4 mg via INTRAVENOUS
  Filled 2011-09-28: qty 4

## 2011-09-28 MED ORDER — NALOXONE HCL 1 MG/ML IJ SOLN
1.0000 mg | Freq: Once | INTRAMUSCULAR | Status: AC
  Administered 2011-09-28: 0.4 mg via INTRAVENOUS

## 2011-09-28 MED ORDER — ETOMIDATE 2 MG/ML IV SOLN
INTRAVENOUS | Status: AC
  Administered 2011-09-28: 20 mg via INTRAVENOUS
  Filled 2011-09-28: qty 20

## 2011-09-28 MED ORDER — DEXTROSE 5 % IV SOLN
500.0000 mg | Freq: Once | INTRAVENOUS | Status: AC
  Administered 2011-09-28: 12:00:00 via INTRAVENOUS
  Filled 2011-09-28: qty 500

## 2011-09-28 MED ORDER — SODIUM CHLORIDE 0.9 % IV SOLN: 1000.0000 mL | INTRAVENOUS | Status: DC

## 2011-09-28 MED ORDER — LIDOCAINE HCL (CARDIAC) 20 MG/ML IV SOLN
INTRAVENOUS | Status: AC
  Filled 2011-09-28: qty 5

## 2011-09-28 MED ORDER — SUCCINYLCHOLINE CHLORIDE 20 MG/ML IJ SOLN
100.0000 mg | Freq: Once | INTRAMUSCULAR | Status: AC
  Administered 2011-09-28: 100 mg via INTRAVENOUS

## 2011-09-28 MED ORDER — NALOXONE HCL 0.4 MG/ML IJ SOLN
INTRAMUSCULAR | Status: AC
  Administered 2011-09-28: 11:00:00 via INTRAVENOUS
  Filled 2011-09-28: qty 1

## 2011-09-28 MED ORDER — ETOMIDATE 2 MG/ML IV SOLN: 0.3000 mg/kg | Freq: Once | INTRAVENOUS | Status: DC

## 2011-09-28 MED ORDER — NALOXONE HCL 0.4 MG/ML IJ SOLN
INTRAMUSCULAR | Status: AC
  Administered 2011-09-28: 11:00:00
  Filled 2011-09-28: qty 1

## 2011-09-28 MED ORDER — PANTOPRAZOLE SODIUM 40 MG IV SOLR
40.0000 mg | INTRAVENOUS | Status: DC
  Administered 2011-09-28 – 2011-09-29 (×2): 40 mg via INTRAVENOUS
  Filled 2011-09-28 (×3): qty 40

## 2011-09-28 MED ORDER — IPRATROPIUM BROMIDE HFA 17 MCG/ACT IN AERS
4.0000 | INHALATION_SPRAY | RESPIRATORY_TRACT | Status: DC
  Administered 2011-09-28 – 2011-09-29 (×5): 4 via RESPIRATORY_TRACT
  Filled 2011-09-28: qty 12.9

## 2011-09-28 MED ORDER — FENTANYL CITRATE 0.05 MG/ML IJ SOLN
50.0000 ug | INTRAMUSCULAR | Status: DC | PRN
  Administered 2011-09-28: 100 ug via INTRAVENOUS
  Filled 2011-09-28: qty 2

## 2011-09-28 MED ORDER — PANTOPRAZOLE SODIUM 40 MG PO PACK
40.0000 mg | PACK | Freq: Every day | ORAL | Status: DC
  Filled 2011-09-28: qty 20

## 2011-09-28 MED ORDER — ROCURONIUM BROMIDE 50 MG/5ML IV SOLN
INTRAVENOUS | Status: AC
  Filled 2011-09-28: qty 2

## 2011-09-28 MED ORDER — HEPARIN SODIUM (PORCINE) 5000 UNIT/ML IJ SOLN
5000.0000 [IU] | Freq: Three times a day (TID) | INTRAMUSCULAR | Status: DC
  Administered 2011-09-28 – 2011-10-01 (×11): 5000 [IU] via SUBCUTANEOUS
  Filled 2011-09-28 (×16): qty 1

## 2011-09-28 MED ORDER — DEXTROSE 5 % IV SOLN: 1.0000 g | INTRAVENOUS | Status: DC

## 2011-09-28 MED ORDER — PROPOFOL 10 MG/ML IV EMUL
5.0000 ug/kg/min | INTRAVENOUS | Status: DC
  Administered 2011-09-28: 40 ug/kg/min via INTRAVENOUS
  Administered 2011-09-28: 20 ug/kg/min via INTRAVENOUS
  Administered 2011-09-29 (×2): 45 ug/kg/min via INTRAVENOUS
  Filled 2011-09-28 (×2): qty 100

## 2011-09-28 NOTE — ED Notes (Signed)
Pt presents to ED via EMS.  Pt was found unarousable by her husband this a.m.  Pt's husband states pt took her sleeping pills before bed last night, but he was unaware of other medications she may have taken.  Husband states pt is taking numerous pain medications for chronic back pain.  Pt's had snoring respirations on arrival.  Pt was responsive to pain on arrival, but not verbal or tactile stimulation.  Pt had good peripheral pulses and bowel sounds in all quadrants.

## 2011-09-28 NOTE — ED Notes (Signed)
Report called to [place] on 2w.

## 2011-09-28 NOTE — ED Notes (Signed)
Pt awake and gesturing for ET tube to be removed.  Dr. Marchelle Gearing notified of pt request, but stated to take pt to floor intubated.

## 2011-09-28 NOTE — H&P (Signed)
Patient name: [name] Medical record number: [id number] Date of birth: 01/14/1966 Age: 46 y.o. Gender: female PCP: Dorrene German, MD, MD  Date: 09/28/2011 Reason for Consult: coma and respiratory failure, suspected drug over dose Referring Physician: Dr Roselyn Bering of [facility]  Brief history  Lines/tubes  Culture data/sepsis markers  Antibiotics  Best practice  Protocols/consults  Events/studies  HPI: From Dr Lynelle Doctor and ER RN. No family at bedside  Apparently has lot of pain meds and insomnia meds and muscle relaxants. Was see in ER a week ago for pain. Last night c/o insomnia and husband noticed her taking some medications but no OD suspected. Patient mom apparently heard the patient walking around house all night. This am found unresponsive and EMS called. Had gag and fought off itnubation and brought to ER where essentially GCS 4-5 and had gag and intubated. Did seem to move all 4s at one point. Pupils pin pioint but UDS negative. No hx of seizure but prior hx of depression + and with some possible OD hx that is unclear. Extreme hypercarbic acute resp aciosis in ER prior to intubation. Pst intubation comatose and PCCM admitting 09/28/2011. BP normal at admit. No ng returns. CXR suspiious of infiltrate and ceftriaxone given in ER. Per Dr Lynelle Doctor patient family had > 20 pill bottles but they were not empty   Past Medical History  Diagnosis Date  . Abdominal pain   . Diabetes mellitus     states her doctor took her off all DM meds in past month  . COPD (chronic obstructive pulmonary disease)   . GERD (gastroesophageal reflux disease)   . Chronic nausea   . Hypothyroidism   . Mental disorder   . Depression     bipolar and schizophrenic  . Headache   . Anxiety     Past Surgical History  Procedure Date  . Laparoscopic assisted vaginal hysterectomy 03/04/2011    Procedure: LAPAROSCOPIC ASSISTED VAGINAL HYSTERECTOMY;  Surgeon: Jeani Hawking, MD;  Location: [facility];  Service:  Gynecology;  Laterality: N/A;  . Cystocele repair 03/04/2011    Procedure: ANTERIOR REPAIR (CYSTOCELE);  Surgeon: Martina Sinner;  Location: [facility];  Service: Urology;  Laterality: N/A;  . Bladder suspension 03/04/2011    Procedure: SPARC PROCEDURE;  Surgeon: Jonna Coup MacDiarmid;  Location: [facility];  Service: Urology;  Laterality: N/A;  . Cystoscopy 03/04/2011    Procedure: CYSTOSCOPY;  Surgeon: Jonna Coup MacDiarmid;  Location: [facility];  Service: Urology;  Laterality: N/A;    No family history on file.  Social History:  reports that she has been smoking Cigarettes.  She has been smoking about 3 packs per day. She does not have any smokeless tobacco history on file. She reports that she does not drink alcohol or use illicit drugs.  Allergies:  Allergies  Allergen Reactions  . Aspirin Nausea And Vomiting    Does fine with ibuprofen    Medications:  Prior to Admission medications   Medication Sig Start Date End Date Taking? Authorizing Provider  albuterol (PROVENTIL HFA;VENTOLIN HFA) 108 (90 BASE) MCG/ACT inhaler Inhale 2 puffs into the lungs every 6 (six) hours as needed. For wheezing     Historical Provider, MD  butalbital-acetaminophen-caffeine (FIORICET, ESGIC) 50-325-40 MG per tablet Take 1 tablet by mouth every 6 (six) hours as needed. For headache      Historical Provider, MD  clonazePAM (KLONOPIN) 0.5 MG tablet Take 1.5 mg by mouth at bedtime and may repeat dose one time if  needed. For anxiety    Historical Provider, MD  cyclobenzaprine (FLEXERIL) 10 MG tablet Take 10 mg by mouth at bedtime as needed. For sleep    Historical Provider, MD  DULoxetine (CYMBALTA) 30 MG capsule Take 90 mg by mouth at bedtime.      Historical Provider, MD  econazole nitrate 1 % cream Apply 1 application topically 2 (two) times daily. To feet    Historical Provider, MD  gabapentin (NEURONTIN) 300 MG capsule Take 300 mg by mouth 3 (three) times daily.      Historical Provider, MD  hydrOXYzine (ATARAX) 25 MG  tablet Take 25 mg by mouth 2 (two) times daily.      Historical Provider, MD  levothyroxine (SYNTHROID, LEVOTHROID) 50 MCG tablet Take 50 mcg by mouth daily.      Historical Provider, MD  lisinopril-hydrochlorothiazide (PRINZIDE,ZESTORETIC) 10-12.5 MG per tablet Take 1 tablet by mouth every morning.    Historical Provider, MD  lurasidone (LATUDA) 40 MG TABS Take by mouth daily with breakfast.      Historical Provider, MD  metaxalone (SKELAXIN) 800 MG tablet Take 400-800 mg by mouth 4 (four) times daily as needed. For Muscle spasms    Historical Provider, MD  Mirabegron ER 25 MG TB24 Take 1 tablet by mouth daily.    Historical Provider, MD  oxyCODONE (OXY IR/ROXICODONE) 5 MG immediate release tablet Take 10 mg by mouth every 8 (eight) hours as needed. For breakthrough back pain    Historical Provider, MD  oxymorphone (OPANA ER) 40 MG 12 hr tablet Take 40 mg by mouth every 8 (eight) hours. For back pain     Historical Provider, MD  promethazine (PHENERGAN) 25 MG tablet Take 25 mg by mouth every 6 (six) hours as needed. For nausea.     Historical Provider, MD  simvastatin (ZOCOR) 20 MG tablet Take 20 mg by mouth daily.      Historical Provider, MD  SUMAtriptan (IMITREX) 6 MG/0.5ML SOLN Inject 6 mg into the skin once as needed. If needed for headache      Historical Provider, MD  tiotropium (SPIRIVA) 18 MCG inhalation capsule Place 18 mcg into inhaler and inhale daily as needed.    Historical Provider, MD  traZODone (DESYREL) 100 MG tablet Take 200 mg by mouth at bedtime.      Historical Provider, MD    Pertinent items are noted in HPI.  Temp:  [97.4 F (36.3 C)] 97.4 F (36.3 C) (04/07 1036) Pulse Rate:  [55-81] 55  (04/07 1222) Resp:  [16-20] 20  (04/07 1222) BP: (111-127)/(73-79) 116/73 mmHg (04/07 1222) SpO2:  [98 %-100 %] 100 % (04/07 1222) FiO2 (%):  [60 %] 60 % (04/07 1100)   No intake or output data in the 24 hours ending 09/28/11 1238 Physical exam  radiology Vent Mode:  [-]  PRVC FiO2 (%):  [60 %] 60 % Set Rate:  [20 bmp] 20 bmp Vt Set:  [450 mL] 450 mL PEEP:  [5 cmH20] 5 cmH20 Plateau Pressure:  [11 cmH20] 11 cmH20  LAB RESULT Lab Results  Component Value Date   CREATININE 1.32* 09/28/2011   BUN 20 09/28/2011   NA 138 09/28/2011   K 4.7 09/28/2011   CL 104 09/28/2011   CO2 27 09/28/2011   Lab Results  Component Value Date   WBC 12.4* 09/28/2011   HGB 13.9 09/28/2011   HCT 42.7 09/28/2011   MCV 89.1 09/28/2011   PLT 229 09/28/2011   Lab Results  Component Value  Date   ALT 16 09/28/2011   AST 14 09/28/2011   ALKPHOS 78 09/28/2011   BILITOT 0.2* 09/28/2011   Lab Results  Component Value Date   INR 0.91 02/27/2011   INR 1.0 08/26/2007   Ct Head Wo Contrast  09/28/2011  *RADIOLOGY REPORT*  Clinical Data: Patient unresponsive.  CT HEAD WITHOUT CONTRAST  Technique:  Contiguous axial images were obtained from the base of the skull through the vertex without contrast.  Comparison: 04/25/2005  Findings: The brain demonstrates no evidence of hemorrhage, infarction, edema, mass effect, extra-axial fluid collection, hydrocephalus or mass lesion.  The skull is unremarkable.  Stable appearing mucosal thickening in frontal and bilateral ethmoid air cells.  There also is a stable small amount of fluid in a few posterior and inferior left sided mastoid air cells.  No surrounding bony destruction or sclerosis.  IMPRESSION: Normal appearance of brain.  Chronic sinus disease and left-sided mastoid fluid.  Original Report Authenticated By: Reola Calkins, M.D.   Dg Chest Port 1 View  09/28/2011  *RADIOLOGY REPORT*  Clinical Data: Intubation  PORTABLE CHEST - 1 VIEW  Comparison: 04/10/2009 the  Findings: The endotracheal tube tip is above the carina.  The heart size is normal.  Increased opacity in the right base is identified suspicious for infection.  Left lung is clear.  IMPRESSION:  1.  The ET tube tip is in satisfactory position above the carina. 2.  Worsening aeration to the right base.   Original Report Authenticated By: Rosealee Albee, M.D.     Assessment and Plan   #COMA with associated Hypercarbic REsp Acidosis  - susect accidental overdose or intolerance in setting of pna/Aecopd though hx for latter is lacking  - no evidence of subclinical siezures PLAN  - diprivan only  - monitor with neuro checks  - neuro consult if not improving   #RESP    Assessment: Acute Resp Acidosis Resp Failure with RLL infiltrate. No witnessed aspiration. Has hx of COPD. Whezing on exam. Suspect AECOPD/PNA   Plan: full vent support   Check abg   Antibiotics   Sepsis biomarkers  #OVERDOSE  Assessment: ? Overdosed in first place. If so, unclear if suicidal or accidental; suspect latter  PLAN : check tylenol and ethanol level   nees psych eval when stable  #RENAL  Assessment:     Lab 09/28/11 1040  CREATININE 1.32*     PLAN  Monitor with IVF  #LYTES  Assessment:     Lab 09/28/11 1040  NA 138  K 4.7  CL 104  CO2 27  GLUCOSE 141*  BUN 20  CREATININE 1.32*  CALCIUM 9.1  MG --  PHOS --    PLan  Monitor  #HEME  Assessment:    Lab 09/28/11 1040  HGB 13.9  HCT 42.7  WBC 12.4*  PLT 229    Plan  Monitor   #ENDOCRINE  Assseesment: Hx of DM  PLAN  Monitor   The patient is critically ill with multiple organ systems failure and requires high complexity decision making for assessment and support, frequent evaluation and titration of therapies, application of advanced monitoring technologies and extensive interpretation of multiple databases.   Critical Care Time devoted to patient care services described in this note is 45  Minutes.  Dr. Kalman Shan, M.D., [facility].C.P Pulmonary and Critical Care Medicine Staff Physician [facility] Pager: [phone], If no answer or between  15:00h - 7:00h: call [phone]  09/28/2011 12:49 PM

## 2011-09-28 NOTE — Progress Notes (Signed)
eLink Physician-Brief Progress Note Patient Name: ASANTI CRAIGO DOB: 1966/02/18 MRN: [id number]  Date of Service  09/28/2011   HPI/Events of Note   Stress-ulcer prophylaxis  eICU Interventions  Protonix   Intervention Category Intermediate Interventions: Best-practice therapies (e.g. DVT, beta blocker, etc.)  Taysom Glymph J 09/28/2011, 5:06 PM

## 2011-09-28 NOTE — Progress Notes (Signed)
ANTIBIOTIC CONSULT NOTE - INITIAL  Pharmacy Consult for Unasyn/Azithromycin Indication: pneumonia  Allergies  Allergen Reactions  . Aspirin Nausea And Vomiting    Does fine with ibuprofen    Patient Measurements:     Vital Signs: Temp: 97.4 F (36.3 C) (04/07 1036) Temp src: Axillary (04/07 1036) BP: 116/73 mmHg (04/07 1222) Pulse Rate: 55  (04/07 1222) Intake/Output from previous day:   Intake/Output from this shift:    Labs:  Basename 09/28/11 1040  WBC 12.4*  HGB 13.9  PLT 229  LABCREA --  CREATININE 1.32*   The CrCl is unknown because both a height and weight (above a minimum accepted value) are required for this calculation.   Microbiology: No results found for this or any previous visit (from the past 720 hour(s)).  Medical History: Past Medical History  Diagnosis Date  . Abdominal pain   . Diabetes mellitus     states her doctor took her off all DM meds in past month  . COPD (chronic obstructive pulmonary disease)   . GERD (gastroesophageal reflux disease)   . Chronic nausea   . Hypothyroidism   . Mental disorder   . Depression     bipolar and schizophrenic  . Headache   . Anxiety     Assessment: 45yof presenting to ER with AMS after taking some medications for sleep last night.  CXR shows suspicion of pneumonia, with aspiration being a consideration.  Beginning Unasyn and Zithromax for treatment.  SCr 1.32, CrCl~64 ml/min (using approximate weight of 78 kg).  Goal of Therapy:  Doses adjusted per renal clearance  Plan:  Unasyn 3g IV q6h. Zithromax 500 mg IV q24h. F/u SCr, culture results.  Clance Boll 09/28/2011,12:45 PM

## 2011-09-28 NOTE — ED Provider Notes (Signed)
History     CSN: [id number]  Arrival date & time 09/28/11  1023   First MD Initiated Contact with Patient 09/28/11 1039      Chief Complaint  Patient presents with  . Altered Mental Status    Level V caveat altered mental status HPI The patient presents to the emergency room with altered mental status. The family states that they last saw her last night acting normally. At some point in night she mentioned she was having trouble going to sleep. They know that she took her medications but they do not know if she took extra. The patient has a history of multiple medical problems and is on multiple medications including oxycodone, opana, cyclobenzaprine, and clonazepam. The family denies that she was having any other difficulty yesterday. She has taken too many pills in the past. She does have history of depression but the family did not mention specifically prior overdoses. The patient is very somnolent here in the emergency room and does not answer any questions. Past Medical History  Diagnosis Date  . Abdominal pain   . Diabetes mellitus     states her doctor took her off all DM meds in past month  . COPD (chronic obstructive pulmonary disease)   . GERD (gastroesophageal reflux disease)   . Chronic nausea   . Hypothyroidism   . Mental disorder   . Depression     bipolar and schizophrenic  . Headache   . Anxiety     Past Surgical History  Procedure Date  . Laparoscopic assisted vaginal hysterectomy 03/04/2011    Procedure: LAPAROSCOPIC ASSISTED VAGINAL HYSTERECTOMY;  Surgeon: Jeani Hawking, MD;  Location: [facility];  Service: Gynecology;  Laterality: N/A;  . Cystocele repair 03/04/2011    Procedure: ANTERIOR REPAIR (CYSTOCELE);  Surgeon: Martina Sinner;  Location: [facility];  Service: Urology;  Laterality: N/A;  . Bladder suspension 03/04/2011    Procedure: SPARC PROCEDURE;  Surgeon: Jonna Coup MacDiarmid;  Location: [facility];  Service: Urology;  Laterality: N/A;  . Cystoscopy  03/04/2011    Procedure: CYSTOSCOPY;  Surgeon: Jonna Coup MacDiarmid;  Location: [facility];  Service: Urology;  Laterality: N/A;    No family history on file.  History  Substance Use Topics  . Smoking status: Current Everyday Smoker -- 3.0 packs/day    Types: Cigarettes  . Smokeless tobacco: Not on file  . Alcohol Use: No    OB History    Grav Para Term Preterm Abortions TAB SAB Ect Mult Living   3 3   0 0 0 0 0 3      Review of Systems  Unable to perform ROS: Mental status change    Allergies  Aspirin  Home Medications   Current Outpatient Rx  Name Route Sig Dispense Refill  . ALBUTEROL SULFATE HFA 108 (90 BASE) MCG/ACT IN AERS Inhalation Inhale 2 puffs into the lungs every 6 (six) hours as needed. For wheezing     . BUTALBITAL-APAP-CAFFEINE 50-325-40 MG PO TABS Oral Take 1 tablet by mouth every 6 (six) hours as needed. For headache      . CLONAZEPAM 0.5 MG PO TABS Oral Take 1.5 mg by mouth at bedtime and may repeat dose one time if needed. For anxiety    . CYCLOBENZAPRINE HCL 10 MG PO TABS Oral Take 10 mg by mouth at bedtime as needed. For sleep    . DULOXETINE HCL 30 MG PO CPEP Oral Take 90 mg by mouth at bedtime.      Marland Kitchen  ECONAZOLE NITRATE 1 % EX CREA Topical Apply 1 application topically 2 (two) times daily. To feet    . GABAPENTIN 300 MG PO CAPS Oral Take 300 mg by mouth 3 (three) times daily.      Marland Kitchen HYDROXYZINE HCL 25 MG PO TABS Oral Take 25 mg by mouth 2 (two) times daily.      Marland Kitchen LEVOTHYROXINE SODIUM 50 MCG PO TABS Oral Take 50 mcg by mouth daily.      Marland Kitchen LISINOPRIL-HYDROCHLOROTHIAZIDE 10-12.5 MG PO TABS Oral Take 1 tablet by mouth every morning.    Marland Kitchen LURASIDONE HCL 40 MG PO TABS Oral Take by mouth daily with breakfast.      . METAXALONE 800 MG PO TABS Oral Take 400-800 mg by mouth 4 (four) times daily as needed. For Muscle spasms    . MIRABEGRON ER 25 MG PO TB24 Oral Take 1 tablet by mouth daily.    . OXYCODONE HCL 5 MG PO TABS Oral Take 10 mg by mouth every 8 (eight)  hours as needed. For breakthrough back pain    . OXYMORPHONE HCL ER 40 MG PO TB12 Oral Take 40 mg by mouth every 8 (eight) hours. For back pain     . PROMETHAZINE HCL 25 MG PO TABS Oral Take 25 mg by mouth every 6 (six) hours as needed. For nausea.     Marland Kitchen SIMVASTATIN 20 MG PO TABS Oral Take 20 mg by mouth daily.      . SUMATRIPTAN SUCCINATE 6 MG/0.5ML [place] SOLN Subcutaneous Inject 6 mg into the skin once as needed. If needed for headache      . TIOTROPIUM BROMIDE MONOHYDRATE 18 MCG IN CAPS Inhalation Place 18 mcg into inhaler and inhale daily as needed.    . TRAZODONE HCL 100 MG PO TABS Oral Take 200 mg by mouth at bedtime.        BP 127/77  Pulse 73  Temp(Src) 97.4 F (36.3 C) (Axillary)  Resp 16  SpO2 100%  LMP 01/08/2011  Physical Exam  Nursing note and vitals reviewed. Constitutional: She appears well-developed and well-nourished. She appears lethargic.  Non-toxic appearance.  HENT:  Head: Normocephalic and atraumatic.  Right Ear: External ear normal.  Left Ear: External ear normal.  Eyes: Conjunctivae are normal. Right eye exhibits no discharge. Left eye exhibits no discharge. No scleral icterus.  Neck: Neck supple. No tracheal deviation present.  Cardiovascular: Normal rate, regular rhythm and intact distal pulses.   Pulmonary/Chest: Effort normal. No stridor. No respiratory distress. She has no wheezes. She has no rales.       Sonorous respirations  Abdominal: Soft. Bowel sounds are normal. She exhibits no distension. There is no tenderness. There is no rebound and no guarding.  Musculoskeletal: She exhibits no edema and no tenderness.  Neurological: She appears lethargic. Cranial nerve deficit:  no gross defecits noted. She exhibits normal muscle tone. She displays no seizure activity. Coordination normal. GCS eye subscore is 2. GCS verbal subscore is 2. GCS motor subscore is 5.  Skin: Skin is warm and dry. No rash noted.  Psychiatric: She has a normal mood and affect.    ED  Course  INTUBATION Performed by: Celene Kras Authorized by: Linwood Dibbles R Consent: Verbal consent obtained. The procedure was performed in an emergent situation. Consent given by: spouse Indications: respiratory failure and airway protection Intubation method: video-assisted Patient status: paralyzed (RSI) Preoxygenation: nonrebreather mask Sedatives: etomidate Paralytic: succinylcholine Tube size: 7.5 mm Tube type: cuffed Number of  attempts: 1 Cricoid pressure: yes Cords visualized: yes Post-procedure assessment: chest rise and CO2 detector Breath sounds: equal Cuff inflated: yes Tube secured with: ETT holder Patient tolerance: Patient tolerated the procedure well with no immediate complications.   (including critical care time)  Date: 09/28/2011  Rate: 73  Rhythm: normal sinus rhythm  QRS Axis: normal  Intervals: normal  ST/T Wave abnormalities: normal  Conduction Disutrbances:none  Narrative Interpretation: Ventricular trigeminy  Old EKG Reviewed: none available   Medications  0.9 %  sodium chloride infusion (not administered)  lidocaine (cardiac) 100 mg/7ml (XYLOCAINE) 20 MG/ML injection 2% (not administered)  rocuronium (ZEMURON) 50 MG/5ML injection (not administered)  midazolam (VERSED) injection 2-4 mg (not administered)  fentaNYL (SUBLIMAZE) injection 50-100 mcg (100 mcg Intravenous Given 09/28/11 1126)  cefTRIAXone (ROCEPHIN) 1 g in dextrose 5 % 50 mL IVPB (not administered)  azithromycin (ZITHROMAX) 500 mg in dextrose 5 % 250 mL IVPB (not administered)  naloxone (NARCAN) 0.4 MG/ML injection (   Given 09/28/11 1037)  naloxone (NARCAN) 0.4 MG/ML injection (  Intravenous Given 09/28/11 1044)  naloxone (NARCAN) injection 1 mg (0.4 mg Intravenous Given 09/28/11 1044)  succinylcholine (ANECTINE) injection 100 mg (100 mg Intravenous Given 09/28/11 1115)  etomidate (AMIDATE) 2 MG/ML injection (20 mg Intravenous Given 09/28/11 1115)  propofol (DIPRIVAN) 10 MG/ML infusion (5  mcg/min  New Bag/Given 09/28/11 1115)  midazolam (VERSED) injection 2-4 mg (4 mg Intravenous Given 09/28/11 1128)   CRITICAL CARE Performed by: Linwood Dibbles R   Total critical care time: 40  Critical care time was exclusive of separately billable procedures and treating other patients.  Critical care was necessary to treat or prevent imminent or life-threatening deterioration.  Critical care was time spent personally by me on the following activities: development of treatment plan with patient and/or surrogate as well as nursing, discussions with consultants, evaluation of patient's response to treatment, examination of patient, obtaining history from patient or surrogate, ordering and performing treatments and interventions, ordering and review of laboratory studies, ordering and review of radiographic studies, pulse oximetry and re-evaluation of patient's condition.  Labs Reviewed  GLUCOSE, CAPILLARY - Abnormal; Notable for the following:    Glucose-Capillary 144 (*)    All other components within normal limits  BLOOD GAS, ARTERIAL - Abnormal; Notable for the following:    pH, Arterial 7.085 (*)    pCO2 arterial 94.7 (*)    pO2, Arterial 221.0 (*)    Bicarbonate 27.1 (*)    Acid-base deficit 5.7 (*)    All other components within normal limits  CBC - Abnormal; Notable for the following:    WBC 12.4 (*)    All other components within normal limits  DIFFERENTIAL - Abnormal; Notable for the following:    Neutrophils Relative 85 (*)    Neutro Abs 10.5 (*)    Lymphocytes Relative 11 (*)    All other components within normal limits  URINE RAPID DRUG SCREEN (HOSP PERFORMED)  AMMONIA  COMPREHENSIVE METABOLIC PANEL  ETHANOL  LACTIC ACID, PLASMA  SALICYLATE LEVEL  ACETAMINOPHEN LEVEL   Dg Chest Port 1 View  09/28/2011  *RADIOLOGY REPORT*  Clinical Data: Intubation  PORTABLE CHEST - 1 VIEW  Comparison: 04/10/2009 the  Findings: The endotracheal tube tip is above the carina.  The heart size  is normal.  Increased opacity in the right base is identified suspicious for infection.  Left lung is clear.  IMPRESSION:  1.  The ET tube tip is in satisfactory position above the carina. 2.  Worsening aeration to the right base.  Original Report Authenticated By: Rosealee Albee, M.D.     1. Altered mental status   2. Acute respiratory acidosis       MDM  The patient's arterial blood shows a hyper cardiac respiratory acidosis. I suspect this is most likely related to some sort of medication ingestion. The patient will be intubated for airway protection and inadequate respirations.  11:34 AM the patient's chest x-ray shows adequate position of her ET tube. There is suspicion of pneumonia at the right base of the lung.  The patient has been placed on a ventilator. Medications for sedation had been ordered while she is on the ventilator. I suspect that the primary etiology of her altered mental status is medication induced. The patient may have taken an intentional overdose or possibly an accidental overdose. She is on numerous medications that can cause this degree of sedation. Opiates are still a possibility as the culprit although she did not have any response to the 1 mg of Narcan.  I have ordered antibiotics to cover for community-acquired pneumonia. Aspiration is a consideration however we will continue to monitor    Celene Kras, MD 09/28/11 1136

## 2011-09-28 NOTE — ED Notes (Signed)
EMS reports ? Aspiration, unsure if possible OD, attempted intubation for airway protection, pt with snoring resp. Last night could not sleep, ? Took to many sleeping pills.

## 2011-09-29 ENCOUNTER — Encounter (HOSPITAL_COMMUNITY): Payer: Self-pay | Admitting: *Deleted

## 2011-09-29 ENCOUNTER — Inpatient Hospital Stay (HOSPITAL_COMMUNITY): Payer: PRIVATE HEALTH INSURANCE

## 2011-09-29 DIAGNOSIS — R4182 Altered mental status, unspecified: Secondary | ICD-10-CM

## 2011-09-29 DIAGNOSIS — J441 Chronic obstructive pulmonary disease with (acute) exacerbation: Secondary | ICD-10-CM | POA: Diagnosis present

## 2011-09-29 DIAGNOSIS — I4891 Unspecified atrial fibrillation: Secondary | ICD-10-CM

## 2011-09-29 DIAGNOSIS — J9600 Acute respiratory failure, unspecified whether with hypoxia or hypercapnia: Secondary | ICD-10-CM | POA: Diagnosis present

## 2011-09-29 DIAGNOSIS — T50901A Poisoning by unspecified drugs, medicaments and biological substances, accidental (unintentional), initial encounter: Secondary | ICD-10-CM | POA: Diagnosis present

## 2011-09-29 DIAGNOSIS — J449 Chronic obstructive pulmonary disease, unspecified: Secondary | ICD-10-CM | POA: Diagnosis present

## 2011-09-29 HISTORY — DX: Unspecified atrial fibrillation: I48.91

## 2011-09-29 LAB — CARDIAC PANEL(CRET KIN+CKTOT+MB+TROPI)
CK, MB: 3.5 ng/mL (ref 0.3–4.0)
Relative Index: 1.1 (ref 0.0–2.5)
Total CK: 227 U/L — ABNORMAL HIGH (ref 7–177)
Troponin I: <0.3 ng/mL (ref <0.30)

## 2011-09-29 LAB — BLOOD GAS, ARTERIAL
Bicarbonate: 26.7 mEq/L — ABNORMAL HIGH (ref 20.0–24.0)
TCO2: 23.9 mmol/L (ref 0–100)
pCO2 arterial: 39 mmHg (ref 35.0–45.0)
pH, Arterial: 7.449 — ABNORMAL HIGH (ref 7.350–7.400)
pO2, Arterial: 68.3 mmHg — ABNORMAL LOW (ref 80.0–100.0)

## 2011-09-29 LAB — BASIC METABOLIC PANEL
BUN: 10 mg/dL (ref 6–23)
Calcium: 8.5 mg/dL (ref 8.4–10.5)
Creatinine, Ser: 0.76 mg/dL (ref 0.50–1.10)
GFR calc Af Amer: >90 mL/min (ref >90)
GFR calc non Af Amer: >90 mL/min (ref >90)
Potassium: 3.3 mEq/L — ABNORMAL LOW (ref 3.5–5.1)

## 2011-09-29 LAB — MAGNESIUM: Magnesium: 1.9 mg/dL (ref 1.5–2.5)

## 2011-09-29 LAB — CBC
HCT: 35 % — ABNORMAL LOW (ref 36.0–46.0)
MCHC: 34.3 g/dL (ref 30.0–36.0)
RDW: 13.3 % (ref 11.5–15.5)

## 2011-09-29 LAB — LEGIONELLA ANTIGEN, URINE: Legionella Antigen, Urine: NEGATIVE

## 2011-09-29 MED ORDER — NICOTINE 21 MG/24HR TD PT24
21.0000 mg | MEDICATED_PATCH | Freq: Every day | TRANSDERMAL | Status: DC
  Administered 2011-09-29 – 2011-10-02 (×4): 21 mg via TRANSDERMAL
  Filled 2011-09-29 (×4): qty 1

## 2011-09-29 MED ORDER — NICOTINE 21 MG/24HR TD PT24
21.0000 mg | MEDICATED_PATCH | Freq: Every day | TRANSDERMAL | Status: DC
  Filled 2011-09-29: qty 1

## 2011-09-29 MED ORDER — METOPROLOL TARTRATE 1 MG/ML IV SOLN
5.0000 mg | Freq: Four times a day (QID) | INTRAVENOUS | Status: DC | PRN
  Administered 2011-09-29: 5 mg via INTRAVENOUS

## 2011-09-29 MED ORDER — DILTIAZEM HCL 100 MG IV SOLR
5.0000 mg/h | INTRAVENOUS | Status: DC
  Administered 2011-09-29: 5 mg/h via INTRAVENOUS
  Administered 2011-09-29: 10 mg/h via INTRAVENOUS
  Filled 2011-09-29: qty 100

## 2011-09-29 MED ORDER — ALBUTEROL SULFATE (5 MG/ML) 0.5% IN NEBU
2.5000 mg | INHALATION_SOLUTION | Freq: Four times a day (QID) | RESPIRATORY_TRACT | Status: DC
  Administered 2011-09-29 – 2011-10-01 (×8): 2.5 mg via RESPIRATORY_TRACT
  Filled 2011-09-29 (×8): qty 0.5

## 2011-09-29 MED ORDER — POTASSIUM CHLORIDE 10 MEQ/100ML IV SOLN
10.0000 meq | INTRAVENOUS | Status: AC
  Administered 2011-09-29 (×2): 10 meq via INTRAVENOUS
  Filled 2011-09-29: qty 200

## 2011-09-29 MED ORDER — IPRATROPIUM BROMIDE 0.02 % IN SOLN
RESPIRATORY_TRACT | Status: AC
  Filled 2011-09-29: qty 2.5

## 2011-09-29 MED ORDER — METOPROLOL TARTRATE 1 MG/ML IV SOLN
5.0000 mg | INTRAVENOUS | Status: DC
  Administered 2011-09-29: 5 mg via INTRAVENOUS
  Filled 2011-09-29: qty 5

## 2011-09-29 MED ORDER — PHENYLEPHRINE HCL 10 MG/ML IJ SOLN
30.0000 ug/min | INTRAVENOUS | Status: DC
  Administered 2011-09-29: 10 ug/min via INTRAVENOUS
  Filled 2011-09-29: qty 1

## 2011-09-29 MED ORDER — METOPROLOL TARTRATE 1 MG/ML IV SOLN
INTRAVENOUS | Status: AC
  Administered 2011-09-29: 5 mg via INTRAVENOUS
  Filled 2011-09-29: qty 5

## 2011-09-29 MED ORDER — OXYCODONE-ACETAMINOPHEN 5-325 MG PO TABS
1.0000 | ORAL_TABLET | ORAL | Status: DC | PRN
  Administered 2011-09-29 – 2011-10-02 (×15): 1 via ORAL
  Filled 2011-09-29 (×15): qty 1

## 2011-09-29 MED ORDER — DILTIAZEM LOAD VIA INFUSION
15.0000 mg | Freq: Once | INTRAVENOUS | Status: AC
  Administered 2011-09-29: 15 mg via INTRAVENOUS
  Filled 2011-09-29: qty 15

## 2011-09-29 MED ORDER — SODIUM CHLORIDE 0.9 % IV SOLN
INTRAVENOUS | Status: DC
  Administered 2011-09-29: 01:00:00 via INTRAVENOUS

## 2011-09-29 MED ORDER — MORPHINE SULFATE CR 30 MG PO TB12
30.0000 mg | ORAL_TABLET | Freq: Two times a day (BID) | ORAL | Status: DC
  Administered 2011-09-29 – 2011-10-02 (×7): 30 mg via ORAL
  Filled 2011-09-29 (×7): qty 1

## 2011-09-29 MED ORDER — IPRATROPIUM BROMIDE 0.02 % IN SOLN
0.5000 mg | Freq: Four times a day (QID) | RESPIRATORY_TRACT | Status: DC
  Administered 2011-09-29 – 2011-10-01 (×8): 0.5 mg via RESPIRATORY_TRACT
  Filled 2011-09-29 (×8): qty 2.5

## 2011-09-29 NOTE — H&P (Signed)
Patient name: [name] Medical record number: [id number] Date of birth: [birth year] Age: 46 y.o. Gender: female PCP: Dorrene German, MD, MD  Date: 09/29/2011 Reason for Consult: coma and respiratory failure, suspected drug over dose Referring Physician: Dr Roselyn Bering of [facility]  Brief history HPI:  Apparently has lot of pain meds and insomnia meds and muscle relaxants. Was see in ER a week ago for pain.  c/o insomnia and husband noticed her taking some medications but no OD suspected. Patient mom apparently heard the patient walking around house all night. This am found unresponsive and EMS called. Had gag and fought off itnubation and brought to ER where essentially GCS 4-5 and had gag and intubated. Did seem to move all 4s at one point. Pupils pin pioint but UDS negative. No hx of seizure but prior hx of depression + and with some possible OD hx that is unclear. Extreme hypercarbic acute resp aciosis in ER prior to intubation. Post intubation comatose and PCCM admitting 09/28/2011. BP normal at admit. No ng returns. CXR suspiious of infiltrate and ceftriaxone given in ER. Per Dr Lynelle Doctor patient family had > 20 pill bottles but they were not empty   Lines/tubes ETT 4/7 - 4/8  Culture data/sepsis markers  Antibiotics unasyn 4/7 >> azithro 4/7 >>  Best practice protonix scds      Past Medical History  Diagnosis Date  . Abdominal pain   . Diabetes mellitus     states her doctor took her off all DM meds in past month  . COPD (chronic obstructive pulmonary disease)   . GERD (gastroesophageal reflux disease)   . Chronic nausea   . Hypothyroidism   . Mental disorder   . Depression     bipolar and schizophrenic  . Headache   . Anxiety    Denies pain, dyspnea, breathing well on PS 5/5   Temp:  [97.4 F (36.3 C)-100.9 F (38.3 C)] 98.5 F (36.9 C) (04/08 0400) Pulse Rate:  [55-100] 77  (04/08 0700) Resp:  [16-27] 20  (04/08 0700) BP: (92-148)/(51-102) 122/67 mmHg (04/08  0700) SpO2:  [97 %-100 %] 98 % (04/08 0700) FiO2 (%):  [30 %-60 %] 30 % (04/08 0804) Weight:  [78 kg (171 lb 15.3 oz)-100.001 kg (220 lb 7.4 oz)] 80.1 kg (176 lb 9.4 oz) (04/08 0500)    Intake/Output Summary (Last 24 hours) at 09/29/11 0821 Last data filed at 09/29/11 0724  Gross per 24 hour  Intake 3495.6 ml  Output   5950 ml  Net -2454.4 ml   Physical exam Gen. Pleasant, well-nourished, in no distress, normal affect ENT - no lesions, no post nasal drip Neck: No JVD, no thyromegaly, no carotid bruits Lungs: no use of accessory muscles, no dullness to percussion, clear without rales or rhonchi  Cardiovascular: Rhythm regular, heart sounds  normal, no murmurs, no peripheral edema Abdomen: soft and non-tender, no hepatosplenomegaly, BS normal. Musculoskeletal: No deformities, no cyanosis or clubbing Neuro:  alert, non focal Skin:  Warm, no lesions/ rash   radiology Vent Mode:  [-] CPAP FiO2 (%):  [30 %-60 %] 30 % Set Rate:  [20 bmp] 20 bmp Vt Set:  [450 mL] 450 mL PEEP:  [5 cmH20] 5 cmH20 Pressure Support:  [5 cmH20] 5 cmH20 Plateau Pressure:  [11 cmH20-17 cmH20] 14 cmH20  LAB RESULT Lab Results  Component Value Date   CREATININE 0.76 09/29/2011   BUN 10 09/29/2011   NA 142 09/29/2011   K 3.3* 09/29/2011  CL 107 09/29/2011   CO2 28 09/29/2011   Lab Results  Component Value Date   WBC 9.3 09/29/2011   HGB 12.0 09/29/2011   HCT 35.0* 09/29/2011   MCV 86.4 09/29/2011   PLT 191 09/29/2011   Lab Results  Component Value Date   ALT 16 09/28/2011   AST 14 09/28/2011   ALKPHOS 78 09/28/2011   BILITOT 0.2* 09/28/2011   Lab Results  Component Value Date   INR 0.91 09/28/2011   INR 0.91 02/27/2011   INR 1.0 08/26/2007   Ct Head Wo Contrast  09/28/2011  *RADIOLOGY REPORT*  Clinical Data: Patient unresponsive.  CT HEAD WITHOUT CONTRAST  Technique:  Contiguous axial images were obtained from the base of the skull through the vertex without contrast.  Comparison: 04/25/2005  Findings: The brain  demonstrates no evidence of hemorrhage, infarction, edema, mass effect, extra-axial fluid collection, hydrocephalus or mass lesion.  The skull is unremarkable.  Stable appearing mucosal thickening in frontal and bilateral ethmoid air cells.  There also is a stable small amount of fluid in a few posterior and inferior left sided mastoid air cells.  No surrounding bony destruction or sclerosis.  IMPRESSION: Normal appearance of brain.  Chronic sinus disease and left-sided mastoid fluid.  Original Report Authenticated By: Reola Calkins, M.D.   Portable Chest Xray In Am  09/29/2011  *RADIOLOGY REPORT*  Clinical Data: Respiratory failure.  COPD.  Depression.  Abdominal pain.  PORTABLE CHEST - 1 VIEW  Comparison: 09/28/2011  Findings: The chin overlies the apices.  Endotracheal tube is unchanged in position, terminating at the level of the clavicles. Nasogastric tube extends beyond the  inferior aspect of the film. Reverse apical lordotic positioning.  Normal heart size.  Possible small right pleural effusion. No pneumothorax.  Lung volumes remain low.  Mild left base atelectasis.  Persistent right base air space disease.  IMPRESSION:  1. No significant change since one day prior. 2.  Persistent right base atelectasis versus infection with possible small right pleural effusion. 3.  Suboptimal patient positioning.  Original Report Authenticated By: Consuello Bossier, M.D.   Dg Chest Port 1 View  09/28/2011  *RADIOLOGY REPORT*  Clinical Data: Intubation  PORTABLE CHEST - 1 VIEW  Comparison: 04/10/2009 the  Findings: The endotracheal tube tip is above the carina.  The heart size is normal.  Increased opacity in the right base is identified suspicious for infection.  Left lung is clear.  IMPRESSION:  1.  The ET tube tip is in satisfactory position above the carina. 2.  Worsening aeration to the right base.  Original Report Authenticated By: Rosealee Albee, M.D.     Assessment and Plan   #COMA with associated  Hypercarbic REsp Acidosis  - susect accidental overdose or intolerance in setting of pna/Aecopd though hx for latter is lacking  - no evidence of subclinical siezures PLAN  - resolved   #RESP    Assessment: Acute Resp Acidosis Resp Failure with RLL infiltrate. No witnessed aspiration. Has hx of COPD. Whezing on exam. Suspect AECOPD/PNA   Plan: Tolerating ps 5/5 --> extubate   Empiric unasyn/ azithro for aspiration/CAP - will simplify if no evolution of infx  Pct is low < 0.10  #OVERDOSE  Assessment: ? Overdosed in first place. If so, unclear if suicidal or accidental; suspect latter  PLAN :  tylenol and ethanol level neg   need more history to clarify need for psych eval  #RENAL  Acute renal failure- resolved  Lab 09/29/11 0155 09/28/11 1040  CREATININE 0.76 1.32*     PLAN  Lower  IVF  #LYTES  Assessment: Hypokalemia    Lab 09/29/11 0155 09/28/11 1610 09/28/11 1040  NA 142 -- 138  K 3.3* -- 4.7  CL 107 -- 104  CO2 28 -- 27  GLUCOSE 111* -- 141*  BUN 10 -- 20  CREATININE 0.76 -- 1.32*  CALCIUM 8.5 -- 9.1  MG 1.9 2.6* --  PHOS 3.5 7.9* --    PLan  repelte  #HEME  Assessment: [facility] - UGI bleed ? Related to gastritis    Lab 09/29/11 0155 09/28/11 1040  HGB 12.0 13.9  HCT 35.0* 42.7  WBC 9.3 12.4*  PLT 191 229    Plan  Hb stable, ct protonix   #ENDOCRINE  Assseesment: Hx of DM  PLAN  Monitor   The patient is critically ill with multiple organ systems failure and requires high complexity decision making for assessment and support, frequent evaluation and titration of therapies, application of advanced monitoring technologies and extensive interpretation of multiple databases.   Critical Care Time devoted to patient care services described in this note is 35  Minutes.  Cyril Mourning MD. Tonny Bollman.  Pulmonary & Critical care Pager [phone] If no response call [phone]   Updated husband  09/29/2011 8:21 AM

## 2011-09-29 NOTE — Progress Notes (Signed)
PT still in Afib RVR. Did not tolerate IV BB. Plan Start Diltiazem drip.  Shan Levans Beeper  [phone]  Cell  [phone]  If no response or cell goes to voicemail, call beeper [phone]

## 2011-09-29 NOTE — Progress Notes (Signed)
Nutrition Consult for TF  Pt now extubated.  Started on a Regular diet.  No recent weight loss. TF now n/a   Ht:  5'2",  Weight:  176.5#, BMI=32.2- Meets criteria for obesity grade 1.  RN started Heart healthy diet but pt addiment about receiving pizza and french fries.  Consult for further needs.  Oran Rein, RD [phone]

## 2011-09-29 NOTE — Progress Notes (Signed)
Pt extubated to 4L [place], titrated to 2L.  Pt tolerated well, cuff leak heard, HR and RR stable throughout procedure.

## 2011-09-29 NOTE — Progress Notes (Signed)
Pt in AFIB RVR with hypotension. Lopressor IV and peripheral Neo drip ordered  Shan Levans Beeper  [phone]  Cell  [phone]  If no response or cell goes to voicemail, call beeper [phone]

## 2011-09-29 NOTE — Progress Notes (Signed)
CARE MANAGEMENT NOTE 09/29/2011  Patient:  [name], [name]   Account Number:  [id number]  Date Initiated:  09/29/2011  Documentation initiated by:  Eylin Pontarelli  Subjective/Objective Assessment:   pt with a.fib not responsive BB started on iv Cardizem drip     Action/Plan:   lives at home   Anticipated DC Date:  10/02/2011   Anticipated DC Plan:  HOME/SELF CARE  In-house referral  NA      DC Planning Services  NA      [facility] Choice  NA   Choice offered to / List presented to:  NA   DME arranged  NA      DME agency  NA     HH arranged  NA      HH agency  NA   Status of service:  In process, will continue to follow Medicare Important Message given?  NA - LOS <3 / Initial given by admissions (If response is "NO", the following Medicare IM given date fields will be blank) Date Medicare IM given:   Date Additional Medicare IM given:    Discharge Disposition:    Per UR Regulation:  Reviewed for med. necessity/level of care/duration of stay  If discussed at Long Length of Stay Meetings, dates discussed:    Comments:  [id number]/Yesenia Locurto,RN,BSN,CCM

## 2011-09-30 ENCOUNTER — Encounter (HOSPITAL_COMMUNITY): Payer: Self-pay | Admitting: Cardiology

## 2011-09-30 DIAGNOSIS — I369 Nonrheumatic tricuspid valve disorder, unspecified: Secondary | ICD-10-CM

## 2011-09-30 DIAGNOSIS — I4891 Unspecified atrial fibrillation: Secondary | ICD-10-CM | POA: Diagnosis not present

## 2011-09-30 DIAGNOSIS — I480 Paroxysmal atrial fibrillation: Secondary | ICD-10-CM | POA: Diagnosis not present

## 2011-09-30 LAB — BASIC METABOLIC PANEL
CO2: 27 mEq/L (ref 19–32)
Calcium: 8.3 mg/dL — ABNORMAL LOW (ref 8.4–10.5)
Chloride: 108 mEq/L (ref 96–112)
Glucose, Bld: 151 mg/dL — ABNORMAL HIGH (ref 70–99)
Sodium: 142 mEq/L (ref 135–145)

## 2011-09-30 LAB — CBC
HCT: 34 % — ABNORMAL LOW (ref 36.0–46.0)
Hemoglobin: 11.5 g/dL — ABNORMAL LOW (ref 12.0–15.0)
MCH: 29.3 pg (ref 26.0–34.0)
MCV: 86.7 fL (ref 78.0–100.0)
Platelets: 171 10*3/uL (ref 150–400)
RBC: 3.92 MIL/uL (ref 3.87–5.11)
WBC: 9.1 10*3/uL (ref 4.0–10.5)

## 2011-09-30 LAB — URINE CULTURE
Colony Count: NO GROWTH
Culture  Setup Time: 201304080214

## 2011-09-30 MED ORDER — DILTIAZEM HCL 60 MG PO TABS
60.0000 mg | ORAL_TABLET | Freq: Four times a day (QID) | ORAL | Status: DC
  Administered 2011-09-30 – 2011-10-01 (×4): 60 mg via ORAL
  Filled 2011-09-30 (×8): qty 1

## 2011-09-30 MED ORDER — DULOXETINE HCL 60 MG PO CPEP
90.0000 mg | ORAL_CAPSULE | Freq: Every day | ORAL | Status: DC
  Administered 2011-09-30 – 2011-10-01 (×2): 90 mg via ORAL
  Filled 2011-09-30 (×4): qty 1

## 2011-09-30 MED ORDER — TRAZODONE HCL 100 MG PO TABS
100.0000 mg | ORAL_TABLET | Freq: Every day | ORAL | Status: DC
  Administered 2011-09-30 – 2011-10-01 (×2): 100 mg via ORAL
  Filled 2011-09-30 (×3): qty 1

## 2011-09-30 MED ORDER — LURASIDONE HCL 40 MG PO TABS
40.0000 mg | ORAL_TABLET | Freq: Every day | ORAL | Status: DC
  Administered 2011-10-01 – 2011-10-02 (×2): 40 mg via ORAL
  Filled 2011-09-30 (×2): qty 1

## 2011-09-30 MED ORDER — MIRABEGRON ER 25 MG PO TB24
1.0000 | ORAL_TABLET | Freq: Every day | ORAL | Status: DC
  Administered 2011-09-30 – 2011-10-02 (×3): 25 mg via ORAL
  Filled 2011-09-30 (×3): qty 1

## 2011-09-30 MED ORDER — POTASSIUM CHLORIDE CRYS ER 20 MEQ PO TBCR
40.0000 meq | EXTENDED_RELEASE_TABLET | ORAL | Status: AC
  Administered 2011-09-30 (×2): 40 meq via ORAL
  Filled 2011-09-30: qty 4

## 2011-09-30 NOTE — Progress Notes (Signed)
Pt's heart rate went to 182 with activity in a-fib. Called PCCM doc in the box and received an order to change to Stepdown status to monitor.

## 2011-09-30 NOTE — Consult Note (Signed)
CARDIOLOGY CONSULT NOTE  Patient ID: [name], MRN: [id number], DOB/AGE: April 06, 1966 46 y.o. Admit date: 09/28/2011 Date of Consult: 09/30/2011  Primary Physician: Dorrene German, MD Primary Cardiologist: New  Chief Complaint: AMS in the setting of accidental overdose Reason for Consultation: Atrial Fibrillation w/ RVR  HPI: 46 y.o. female w/ PMHx significant for tobacco abuse, obesity, HTN, DMII, COPD, Hypothyroidism, Bipolar, Schizophrenia, and Anxiety who presented to [facility] on 09/28/2011 with altered mental status.  She has a history of depression and overdose in the past (>38yrs ago). She takes multiple medications including sleeping pills, muscle relaxants, and pain medications for chronic back pain. Was last seen normal last night after taking night time meds before bed. Husband found her unarousable the morning of presentation and called EMS who brought her to the ED. Patient reports she thinks she took some of her medications twice including Cymbalta and Trazadone, but isn't sure exactly which ones. She denies intentionally trying to harm herself.  In the ED she was lethargic w/ pH 7.085 requiring intubation for airway protection. EKG showed NSR 73bpm with PVCs, no acute ST/T changes. CXR showed opacity in right lung base suspicious for pneumonia. Labs significant for WBC 12.4, Crt 1.32, UDS/tylenol/etoh levels negative. There was suspicion of intentional vs accidental overdose. She was admitted for further evaluation and treatment. She was placed on Abx for suspected CAP vs aspiration PNA. CT head unremarkable. She was extubated on 09/29/11. There was question of coffee ground drainage in OG. She has had no signs of active bleeding. No BMs since admission. H&H 13.9/42.7 --> 11.5/34.0.  She converted to Atrial Fibrillation with RVR on 09/29/11 around 10am with rates 130s-140s initially treated with IV Lopressor with drop in BP and placed on Neo. Cardizem drip started with rates  improving in the 60s-90s until this morning after drip was discontinued and oral cardizem started (around 0800). Rates increased to 100s-130s (highest 160-170s). Cardiology is asked to assist in management of A. Fib. Has had some hypokalemia (as low as 3.0) requiring supplementation. Cardiac enzymes negative. She denies feeling her atrial fibrillation yesterday, but states she felt palpitations and shortness of breath with elevated rates this morning.  She reports having occasional palpitations at home but only with stress or exertion. Has intermittent left sided chest pain over the last month that occurs with rest and exertion. Describes chest pain as a "tightness or gassy feeling" under her left breast with throbbing into her left arm that lasts about 15-30 mins. It improves with antacids and "shaking" her left arm. Associated with diaphoresis. Denies associated sob or nausea. She states she has difficulty sleeping in general, but sleeps sitting up due to orthopnea. (+) PND, edema. Reports having COPD and being told she needs to wear oxygen at night, but she doesn't wear it very often and sometimes wears it during the day if she has DOE. Not sure if she has had a sleep study but states she snores and wakes herself up sometimes.    Past Medical History  Diagnosis Date  . Abdominal pain   . Diabetes mellitus     states her doctor took her off all DM meds in past month  . COPD (chronic obstructive pulmonary disease)   . GERD (gastroesophageal reflux disease)   . Chronic nausea   . Hypothyroidism   . Mental disorder   . Depression     bipolar and schizophrenic  . Headache   . Anxiety       Surgical  History:  Past Surgical History  Procedure Date  . Laparoscopic assisted vaginal hysterectomy 03/04/2011    Procedure: LAPAROSCOPIC ASSISTED VAGINAL HYSTERECTOMY;  Surgeon: Jeani Hawking, MD;  Location: [facility];  Service: Gynecology;  Laterality: N/A;  . Cystocele repair 03/04/2011    Procedure:  ANTERIOR REPAIR (CYSTOCELE);  Surgeon: Martina Sinner;  Location: [facility];  Service: Urology;  Laterality: N/A;  . Bladder suspension 03/04/2011    Procedure: SPARC PROCEDURE;  Surgeon: Jonna Coup MacDiarmid;  Location: [facility];  Service: Urology;  Laterality: N/A;  . Cystoscopy 03/04/2011    Procedure: CYSTOSCOPY;  Surgeon: Jonna Coup MacDiarmid;  Location: [facility];  Service: Urology;  Laterality: N/A;     Home Meds: Medication Sig  albuterol (PROVENTIL HFA;VENTOLIN HFA) 108 (90 BASE) MCG/ACT inhaler Inhale 2 puffs into the lungs every 6 (six) hours as needed. For wheezing   budesonide-formoterol (SYMBICORT) 160-4.5 MCG/ACT inhaler Inhale 2 puffs into the lungs 2 (two) times daily.  butalbital-acetaminophen-caffeine (FIORICET, ESGIC) 50-325-40 MG per tablet Take 1 tablet by mouth every 6 (six) hours as needed. For headache    clonazePAM (KLONOPIN) 0.5 MG tablet Take 1.5 mg by mouth at bedtime and may repeat dose one time if needed. For anxiety  cyclobenzaprine (FLEXERIL) 10 MG tablet Take 10 mg by mouth at bedtime as needed. For sleep  diclofenac (VOLTAREN) 75 MG EC tablet Take 75 mg by mouth 2 (two) times daily.  DULoxetine (CYMBALTA) 30 MG capsule Take 90 mg by mouth at bedtime.    econazole nitrate 1 % cream Apply 1 application topically 2 (two) times daily. To feet  gabapentin (NEURONTIN) 100 MG capsule Take 100 mg by mouth 2 (two) times daily.  gabapentin (NEURONTIN) 300 MG capsule Take 300 mg by mouth 3 (three) times daily.    hydrOXYzine (ATARAX) 25 MG tablet Take 25 mg by mouth 2 (two) times daily.   levothyroxine (SYNTHROID, LEVOTHROID) 50 MCG tablet Take 50 mcg by mouth daily.    lisinopril-hydrochlorothiazide (PRINZIDE,ZESTORETIC) 10-12.5 MG per tablet Take 1 tablet by mouth every morning.  lurasidone (LATUDA) 40 MG TABS Take by mouth daily with breakfast.    metaxalone (SKELAXIN) 800 MG tablet Take 400-800 mg by mouth 4 (four) times daily as needed. For Muscle spasms  Mirabegron ER 25  MG TB24 Take 1 tablet by mouth daily.  ondansetron (ZOFRAN) 4 MG tablet Take 4 mg by mouth every 8 (eight) hours as needed. For nausea  oxyCODONE-acetaminophen (PERCOCET) 10-325 MG per tablet Take 1 tablet by mouth 2 (two) times daily.  oxyCODONE-acetaminophen (PERCOCET) 5-325 MG per tablet Take 1 tablet by mouth every 4 (four) hours as needed. For pain  oxymorphone (OPANA ER) 40 MG 12 hr tablet Take 40 mg by mouth every 8 (eight) hours. For back pain   promethazine (PHENERGAN) 25 MG tablet Take 25 mg by mouth every 6 (six) hours as needed. For nausea.   simvastatin (ZOCOR) 20 MG tablet Take 20 mg by mouth daily.    tiotropium (SPIRIVA) 18 MCG inhalation capsule Place 18 mcg into inhaler and inhale daily as needed.  traZODone (DESYREL) 100 MG tablet Take 200 mg by mouth at bedtime.    SUMAtriptan (IMITREX) 6 MG/0.5ML SOLN Inject 6 mg into the skin once as needed. If needed for headache      Inpatient Medications:   . albuterol  2.5 mg Nebulization Q6H  . diltiazem  15 mg Intravenous Once  . diltiazem  60 mg Oral Q6H  . DULoxetine  90 mg  Oral QHS  . heparin  5,000 Units Subcutaneous Q8H  . ipratropium  0.5 mg Nebulization Q6H  . lurasidone  40 mg Oral Q breakfast  . Mirabegron ER  1 tablet Oral Daily  . morphine  30 mg Oral Q12H  . nicotine  21 mg Transdermal Daily  . potassium chloride  40 mEq Oral Q1 Hr x 2  . traZODone  100 mg Oral QHS  . DISCONTD: ampicillin-sulbactam (UNASYN) IV  3 g Intravenous Q6H  . DISCONTD: azithromycin  500 mg Intravenous Q24H  . DISCONTD: metoprolol  5 mg Intravenous Q4H  . DISCONTD: pantoprazole (PROTONIX) IV  40 mg Intravenous Q24H  . DISCONTD: predniSONE  40 mg Oral Q breakfast   . sodium chloride 50 mL/hr at 09/29/11 1758  . DISCONTD: diltiazem (CARDIZEM) infusion Stopped (09/30/11 0800)  . DISCONTD: phenylephrine (NEO-SYNEPHRINE) Adult infusion Stopped (09/29/11 2245)    Allergies:  Allergies  Allergen Reactions  . Aspirin Nausea And Vomiting      Does fine with ibuprofen    Social History  . Marital Status: Married   Social History Main Topics  . Smoking status: Current Everyday Smoker -- 3.0 packs/day    Types: Cigarettes  . Alcohol Use: No  . Drug Use: No  . Sexually Active: Yes     No family history on file.   Review of Systems: General: (+) "hot flashes"; negative for chills, fever, night sweats or weight changes.  Cardiovascular: (+) shortness of breath, dyspnea on exertion, edema, orthopnea, palpitations, or paroxysmal nocturnal dyspnea; negative for chest pain Dermatological: negative for rash Respiratory: negative for cough or wheezing Urologic: negative for hematuria Abdominal: negative for nausea, vomiting, diarrhea, bright red blood per rectum, melena, or hematemesis Neurologic: negative for visual changes, syncope, or dizziness All other systems reviewed and are otherwise negative except as noted above.  Labs:  [facility] 09/29/11 0954 09/29/11 0155 09/28/11 1808  CKTOTAL 227* 226* 337*  CKMB 2.6 3.5 6.7*  TROPONINI <0.30 <0.30 <0.30   Component Value Date   WBC 9.1 09/30/2011   HGB 11.5* 09/30/2011   HCT 34.0* 09/30/2011   MCV 86.7 09/30/2011   PLT 171 09/30/2011    Lab 09/30/11 0330 09/28/11 1040  NA 142 --  K 3.0* --  CL 108 --  CO2 27 --  BUN 10 --  CREATININE 0.61 --  CALCIUM 8.3* --  PROT -- 7.3  BILITOT -- 0.2*  ALKPHOS -- 78  ALT -- 16  AST -- 14  GLUCOSE 151* --    Radiology/Studies:   09/28/2011 - CT Head Findings: The brain demonstrates no evidence of hemorrhage, infarction, edema, mass effect, extra-axial fluid collection, hydrocephalus or mass lesion.  The skull is unremarkable.  Stable appearing mucosal thickening in frontal and bilateral ethmoid air cells.  There also is a stable small amount of fluid in a few posterior and inferior left sided mastoid air cells.  No surrounding bony destruction or sclerosis.  IMPRESSION: Normal appearance of brain.  Chronic sinus disease and  left-sided mastoid fluid.    09/29/2011 - CXR Findings: The chin overlies the apices.  Endotracheal tube is unchanged in position, terminating at the level of the clavicles. Nasogastric tube extends beyond the  inferior aspect of the film. Reverse apical lordotic positioning.  Normal heart size.  Possible small right pleural effusion. No pneumothorax.  Lung volumes remain low.  Mild left base atelectasis.  Persistent right base air space disease.  IMPRESSION:  1. No significant change since one day  prior. 2.  Persistent right base atelectasis versus infection with possible small right pleural effusion. 3.  Suboptimal patient positioning.    09/28/2011 -CXR Findings: The endotracheal tube tip is above the carina.  The heart size is normal.  Increased opacity in the right base is identified suspicious for infection.  Left lung is clear.  IMPRESSION:  1.  The ET tube tip is in satisfactory position above the carina. 2.  Worsening aeration to the right base.    EKG: 09/29/11 @ 1040 - Atrial Fibrillation 138bpm, nonspecific ST/T changes  09/28/11 @ 1041 - Sinus rhythm 73bpm, PVCs  Physical Exam: Blood pressure 130/83, pulse 95, temperature 97.8 F (36.6 C), temperature source Oral, resp. rate 23, height 5\' 2"  (1.575 m), weight 176 lb 9.4 oz (80.1 kg), last menstrual period 01/08/2011, SpO2 98.00%. General: Pleasant overweight black female in no acute distress. Head: Normocephalic, atraumatic, sclera non-icteric, no xanthomas, nares are without discharge.  Neck: Supple. Negative for carotid bruits. JVD not elevated. Lungs: Diminished throughout with poor air movement. Fine bibasilar rales. No wheezes or rhonchi. Breathing is unlabored. Heart: Irregularly irregular with S1 S2. No murmurs, rubs, or gallops appreciated. Abdomen: Soft, non-tender, non-distended with normoactive bowel sounds.  No rebound/guarding. No obvious abdominal masses. Msk:  Strength and tone appear normal for age. Extremities: No clubbing  or cyanosis. No edema.  Distal pedal pulses are 2+ and equal bilaterally. Neuro: Alert and oriented X 3. Moves all extremities spontaneously. Psych:  Responds to questions appropriately with a normal affect.   Assessment and Plan:  46 y.o. female w/ PMHx significant for tobacco abuse, obesity, HTN, DMII, COPD, Hypothyroidism, Bipolar, Schizophrenia, and Anxiety who presented to [facility] on 09/28/2011 with altered mental status.  1. Atrial Fibrillation w/ RVR: No history of a.fib or other arrhythmias. Now with A.Fib w/ RVR in the setting of accidental overdose. Initially rate controlled on IV Cardizem, but now uncontrolled after switching to oral (note the drip was stopped before initiation of oral cardizem). CHADS2 score 2 (HTN & DM). K+ low --> supplemented. Mg initially elevated, but now normal. Will check TSH and Echo. Keep current oral cardizem dose for now as she has only received one dose and it was 3+ hours after dc of drip. If not controlled can increase oral dose. If she does not spontaneously convert to NSR by tomorrow plan for DCCV. Will need anticoagulation for 4wks after DCCV. ? Anticoagulation now with questionable GI bleed. Discuss with MD. Recommend outpatient sleep study to assess for OSA.  Signed, HOPE, JESSICA PA-C 09/30/2011, 12:07 PM  Patient seen and examined and history reviewed. Agree with above findings and plan. History reviewed in detail. Patient admitted with accidental overdose of psych meds with respiratory failure requiring intubation. Was initially in NSR but went into afib with RVR yesterday am. Patient is asymptomatic. Was on IV cardizem then switched to po today. IV stopped at 8 am and 1st oral dose at 11 am. Now HR 90-120 bpm. Exam reveals pleasant BF in NAD. No JVD. Lungs clear. CV IRR without murmur or gallop. No edema. Cardiac enzymes nl.TSH and Echo pending. She does have a [place] score of 2. I would recommend continuing oral cardizem at 60 mg qid. I think  this will yield adequate rate control. I am concerned about fully anticoagulating her with some coffee ground drainage from NG tube before and dropping Hgb. Will continue SQ heparin for now. If she remains in Afib tomorrow and Hgb stable will need to  reconsider anticoagulation. If she doesn't convert to NSR spontaneously can consider DCCV. Will check Echo today. Would consider formal sleep study as outpatient given hx of nocturnal hypoxemia and snoring hx.  Theron Arista [facility] 09/30/2011 2:55 PM

## 2011-09-30 NOTE — Progress Notes (Signed)
eLink Physician-Brief Progress Note Patient Name: [name] DOB: 1965/10/10 MRN: [id number]  Date of Service  09/30/2011   HPI/Events of Note  Hypokalemia  eICU Interventions  Potassium replaced   Intervention Category Intermediate Interventions: Electrolyte abnormality - evaluation and management  [name] 09/30/2011, 4:27 AM

## 2011-09-30 NOTE — Progress Notes (Signed)
Patient name: [name] Medical record number: [id number] Date of birth: 17-Jan-1966 Age: 46 y.o. Gender: female PCP: Dorrene German, MD, MD  Date: 09/30/2011 Reason for Consult: coma and respiratory failure, suspected drug over dose Referring Physician: Dr Roselyn Bering of [facility]  Brief history HPI:  Apparently has lot of pain meds and insomnia meds and muscle relaxants. Was see in ER a week ago for pain.  c/o insomnia and husband noticed her taking some medications but no OD suspected. Patient mom apparently heard the patient walking around house all night. This am found unresponsive and EMS called. Had gag and fought off itnubation and brought to ER where essentially GCS 4-5 and had gag and intubated. Did seem to move all 4s at one point. Pupils pin pioint but UDS negative. No hx of seizure but prior hx of depression + and with some possible OD hx that is unclear. Extreme hypercarbic acute resp aciosis in ER prior to intubation. Post intubation comatose and PCCM admitting 09/28/2011. BP normal at admit. No ng returns. CXR suspiious of infiltrate and ceftriaxone given in ER. Per Dr Lynelle Doctor patient family had > 20 pill bottles but they were not empty   Lines/tubes ETT 4/7 - 4/8  Culture data/sepsis markers  Antibiotics unasyn 4/7 >> azithro 4/7 >>  Best practice protonix scds      Past Medical History  Diagnosis Date  . Abdominal pain   . Diabetes mellitus     states her doctor took her off all DM meds in past month  . COPD (chronic obstructive pulmonary disease)   . GERD (gastroesophageal reflux disease)   . Chronic nausea   . Hypothyroidism   . Mental disorder   . Depression     bipolar and schizophrenic  . Headache   . Anxiety    Denies pain, dyspnea, breathing well on PS 5/5   Temp:  [97.9 F (36.6 C)-98.4 F (36.9 C)] 97.9 F (36.6 C) (04/09 0800) Pulse Rate:  [55-149] 89  (04/09 0900) Resp:  [11-27] 11  (04/09 0900) BP: (81-131)/(47-88) 122/75 mmHg (04/09  0900) SpO2:  [94 %-100 %] 100 % (04/09 0900)    Intake/Output Summary (Last 24 hours) at 09/30/11 0946 Last data filed at 09/30/11 0900  Gross per 24 hour  Intake 2431.88 ml  Output   3050 ml  Net -618.12 ml   Physical exam Gen. Pleasant, well-nourished, in no distress, normal affect ENT - no lesions, no post nasal drip Neck: No JVD, no thyromegaly, no carotid bruits Lungs: no use of accessory muscles, no dullness to percussion, clear without rales or rhonchi  Cardiovascular: Rhythm regular, heart sounds  normal, no murmurs, no peripheral edema Abdomen: soft and non-tender, no hepatosplenomegaly, BS normal. Musculoskeletal: No deformities, no cyanosis or clubbing Neuro:  alert, non focal Skin:  Warm, no lesions/ rash   radiology    LAB RESULT Lab Results  Component Value Date   CREATININE 0.61 09/30/2011   BUN 10 09/30/2011   NA 142 09/30/2011   K 3.0* 09/30/2011   CL 108 09/30/2011   CO2 27 09/30/2011   Lab Results  Component Value Date   WBC 9.1 09/30/2011   HGB 11.5* 09/30/2011   HCT 34.0* 09/30/2011   MCV 86.7 09/30/2011   PLT 171 09/30/2011   Lab Results  Component Value Date   ALT 16 09/28/2011   AST 14 09/28/2011   ALKPHOS 78 09/28/2011   BILITOT 0.2* 09/28/2011   Lab Results  Component Value Date  INR 0.91 09/28/2011   INR 0.91 02/27/2011   INR 1.0 08/26/2007   Ct Head Wo Contrast  09/28/2011  *RADIOLOGY REPORT*  Clinical Data: Patient unresponsive.  CT HEAD WITHOUT CONTRAST  Technique:  Contiguous axial images were obtained from the base of the skull through the vertex without contrast.  Comparison: 04/25/2005  Findings: The brain demonstrates no evidence of hemorrhage, infarction, edema, mass effect, extra-axial fluid collection, hydrocephalus or mass lesion.  The skull is unremarkable.  Stable appearing mucosal thickening in frontal and bilateral ethmoid air cells.  There also is a stable small amount of fluid in a few posterior and inferior left sided mastoid air cells.  No  surrounding bony destruction or sclerosis.  IMPRESSION: Normal appearance of brain.  Chronic sinus disease and left-sided mastoid fluid.  Original Report Authenticated By: Reola Calkins, M.D.   Portable Chest Xray In Am  09/29/2011  *RADIOLOGY REPORT*  Clinical Data: Respiratory failure.  COPD.  Depression.  Abdominal pain.  PORTABLE CHEST - 1 VIEW  Comparison: 09/28/2011  Findings: The chin overlies the apices.  Endotracheal tube is unchanged in position, terminating at the level of the clavicles. Nasogastric tube extends beyond the  inferior aspect of the film. Reverse apical lordotic positioning.  Normal heart size.  Possible small right pleural effusion. No pneumothorax.  Lung volumes remain low.  Mild left base atelectasis.  Persistent right base air space disease.  IMPRESSION:  1. No significant change since one day prior. 2.  Persistent right base atelectasis versus infection with possible small right pleural effusion. 3.  Suboptimal patient positioning.  Original Report Authenticated By: Consuello Bossier, M.D.   Dg Chest Port 1 View  09/28/2011  *RADIOLOGY REPORT*  Clinical Data: Intubation  PORTABLE CHEST - 1 VIEW  Comparison: 04/10/2009 the  Findings: The endotracheal tube tip is above the carina.  The heart size is normal.  Increased opacity in the right base is identified suspicious for infection.  Left lung is clear.  IMPRESSION:  1.  The ET tube tip is in satisfactory position above the carina. 2.  Worsening aeration to the right base.  Original Report Authenticated By: Rosealee Albee, M.D.     Assessment and Plan   #COMA with associated Hypercarbic REsp Acidosis  - suspect accidental overdose or intolerance in setting of pna/Aecopd though hx for latter is lacking  - no evidence of subclinical siezures PLAN  - resolved -Resume bipolar meds & pain meds in 1/2 doses, gradually re-introduce other meds in 1/2 doses over next 1-2 days (probably does not need full doses)   #RESP     Assessment: Acute Resp Acidosis Resp Failure with RLL infiltrate. No witnessed aspiration. Has hx of COPD. Whezing on exam. Suspect AECOPD/PNA   Plan: Tolerating ps 5/5 --> extubate   Empiric unasyn/ azithro for aspiration/CAP - dc since no evolution of infx  Pct is low < 0.10  #OVERDOSE  Assessment: ? Overdosed in first place. If so, unclear if suicidal or accidental; suspect latter  PLAN :  tylenol and ethanol level neg  No need for psych eval  # New onset Afibn - cardizem PO 60 q 6h , cards input  transfer to tele  #RENAL  Acute renal failure- resolved     Lab 09/30/11 0330 09/29/11 0155 09/28/11 1040  CREATININE 0.61 0.76 1.32*     PLAN  Lower  IVF  #LYTES  Assessment: Hypokalemia    Lab 09/30/11 0330 09/29/11 0155 09/28/11 1610 09/28/11 1040  NA 142 142 -- 138  K 3.0* 3.3* -- --  CL 108 107 -- 104  CO2 27 28 -- 27  GLUCOSE 151* 111* -- 141*  BUN 10 10 -- 20  CREATININE 0.61 0.76 -- 1.32*  CALCIUM 8.3* 8.5 -- 9.1  MG -- 1.9 2.6* --  PHOS -- 3.5 7.9* --    PLan  repelte  #HEME  Assessment: Coffee grounds - UGI bleed ? Related to gastritis    Lab 09/30/11 0330 09/29/11 0155 09/28/11 1040  HGB 11.5* 12.0 13.9  HCT 34.0* 35.0* 42.7  WBC 9.1 9.3 12.4*  PLT 171 191 229    Plan  Hb stable, ct protonix   #ENDOCRINE  Assseesment: Hx of DM  PLAN  Monitor     Cyril Mourning MD. FCCP. [facility] Pager [phone] If no response call [phone]   Updated husband  09/30/2011 9:46 AM

## 2011-09-30 NOTE — Progress Notes (Signed)
  Echocardiogram 2D Echocardiogram has been performed.  Cathie Beams Deneen 09/30/2011, 3:57 PM

## 2011-10-01 ENCOUNTER — Encounter (HOSPITAL_COMMUNITY): Payer: Self-pay | Admitting: *Deleted

## 2011-10-01 MED ORDER — BUDESONIDE-FORMOTEROL FUMARATE 160-4.5 MCG/ACT IN AERO
2.0000 | INHALATION_SPRAY | Freq: Two times a day (BID) | RESPIRATORY_TRACT | Status: DC
  Administered 2011-10-01 – 2011-10-02 (×2): 2 via RESPIRATORY_TRACT
  Filled 2011-10-01: qty 6

## 2011-10-01 MED ORDER — DILTIAZEM HCL ER COATED BEADS 240 MG PO CP24
240.0000 mg | ORAL_CAPSULE | Freq: Every day | ORAL | Status: DC
  Administered 2011-10-01 – 2011-10-02 (×2): 240 mg via ORAL
  Filled 2011-10-01 (×2): qty 1

## 2011-10-01 MED ORDER — TIOTROPIUM BROMIDE MONOHYDRATE 18 MCG IN CAPS
18.0000 ug | ORAL_CAPSULE | Freq: Every day | RESPIRATORY_TRACT | Status: DC
  Administered 2011-10-01: 18 ug via RESPIRATORY_TRACT
  Filled 2011-10-01: qty 5

## 2011-10-01 MED ORDER — GABAPENTIN 300 MG PO CAPS
300.0000 mg | ORAL_CAPSULE | Freq: Three times a day (TID) | ORAL | Status: DC
  Administered 2011-10-01 – 2011-10-02 (×2): 300 mg via ORAL
  Filled 2011-10-01 (×5): qty 1

## 2011-10-01 NOTE — Progress Notes (Signed)
@   Subjective:  Denies CP or dyspnea   Objective:  Filed Vitals:   10/01/11 0400 10/01/11 0500 10/01/11 0527 10/01/11 0528  BP:   125/66 125/66  Pulse:   60   Temp: 97.7 F (36.5 C)     TempSrc: Oral     Resp:   17   Height:      Weight:  181 lb 14.1 oz (82.5 kg)    SpO2:   94%     Intake/Output from previous day:  Intake/Output Summary (Last 24 hours) at 10/01/11 0704 Last data filed at 10/01/11 0600  Gross per 24 hour  Intake   2140 ml  Output   3550 ml  Net  -1410 ml    Physical Exam: Physical exam: Well-developed well-nourished in no acute distress.  Skin is warm and dry.  HEENT is normal.  Neck is supple.  Chest is clear to auscultation with normal expansion.  Cardiovascular exam is regular rate and rhythm.  Abdominal exam nontender or distended. No masses palpated. Extremities show no edema. neuro grossly intact    Lab Results: Basic Metabolic Panel:  Basename 09/30/11 0330 09/29/11 0155 09/28/11 1610  NA 142 142 --  K 3.0* 3.3* --  CL 108 107 --  CO2 27 28 --  GLUCOSE 151* 111* --  BUN 10 10 --  CREATININE 0.61 0.76 --  CALCIUM 8.3* 8.5 --  MG -- 1.9 2.6*  PHOS -- 3.5 7.9*   CBC:  Basename 09/30/11 0330 09/29/11 0155 09/28/11 1040  WBC 9.1 9.3 --  NEUTROABS -- -- 10.5*  HGB 11.5* 12.0 --  HCT 34.0* 35.0* --  MCV 86.7 86.4 --  PLT 171 191 --   Cardiac Enzymes:  Basename 09/29/11 0954 09/29/11 0155 09/28/11 1808  CKTOTAL 227* 226* 337*  CKMB 2.6 3.5 6.7*  CKMBINDEX -- -- --  TROPONINI <0.30 <0.30 <0.30     Assessment/Plan:  1) Atrial fibrillation - Patient has converted to sinus; change cardizem to CD; LV function normal on echo. I would be hesitant to anticoagulate at this point for multiple reasons (A fib may have been related to stress of illness at presentation, patient with history of overdose, ? Gastritis or upper GI bleed). Would treat with ASA. TSH low; May need adjustment of synthroid; will leave to primary care.  Patient  should FU with Dr [place] 2-4 weeks after DC for further assessment of her atrial fibrillation. Olga Millers 10/01/2011, 7:04 AM

## 2011-10-01 NOTE — Progress Notes (Signed)
[facility]  Interim history: 46 y/o admitted with lethargy on 4/7- suspected accidental overdose of medications. She was intubated in the ER for hypercarbic resp failure. She also has COPD which could have been a component.  She was extubated on 4/8.  She developed a-fib with RVR on 4/8 and felt palpitations and dyspea due to this-  has converted back to NSR.   She is being transferred to [place] from [facility] service today.   Subjective: Alert, sitting in chair without any complaints. States she will stat using a pill box for all of her meds. States she did not intentionally overdose.   Objective: Blood pressure 110/64, pulse 64, temperature 97.8 F (36.6 C), temperature source Oral, resp. rate 17, height 5\' 2"  (1.575 m), weight 82.5 kg (181 lb 14.1 oz), last menstrual period 01/08/2011, SpO2 97.00%. Weight change:   Intake/Output Summary (Last 24 hours) at 10/01/11 1414 Last data filed at 10/01/11 1200  Gross per 24 hour  Intake   2300 ml  Output   2600 ml  Net   -300 ml    Physical Exam: General appearance: alert and cooperative Lungs: clear to auscultation bilaterally Heart: regular rate and rhythm, S1, S2 normal, no murmur, click, rub or gallop Abdomen: mild tenderness in epigastrium. BS +, nondistended Extremities: extremities normal, atraumatic, no cyanosis or edema  Lab Results:  Basename 09/30/11 0330 09/29/11 0155 09/28/11 1610  NA 142 142 --  K 3.0* 3.3* --  CL 108 107 --  CO2 27 28 --  GLUCOSE 151* 111* --  BUN 10 10 --  CREATININE 0.61 0.76 --  CALCIUM 8.3* 8.5 --  MG -- 1.9 2.6*  PHOS -- 3.5 7.9*   No results found for this basename: AST:2,ALT:2,ALKPHOS:2,BILITOT:2,PROT:2,ALBUMIN:2 in the last 72 hours  Basename 09/28/11 1610  LIPASE 36  AMYLASE 71    Basename 09/30/11 0330 09/29/11 0155  WBC 9.1 9.3  NEUTROABS -- --  HGB 11.5* 12.0  HCT 34.0* 35.0*  MCV 86.7 86.4  PLT 171 191    Basename 09/29/11 0954 09/29/11 0155 09/28/11 1808  CKTOTAL 227*  226* 337*  CKMB 2.6 3.5 6.7*  CKMBINDEX -- -- --  TROPONINI <0.30 <0.30 <0.30   No components found with this basename: POCBNP:3 No results found for this basename: DDIMER:2 in the last 72 hours No results found for this basename: HGBA1C:2 in the last 72 hours No results found for this basename: CHOL:2,HDL:2,LDLCALC:2,TRIG:2,CHOLHDL:2,LDLDIRECT:2 in the last 72 hours  Basename 09/30/11 1330  TSH 0.242*  T4TOTAL --  T3FREE --  THYROIDAB --   No results found for this basename: VITAMINB12:2,FOLATE:2,FERRITIN:2,TIBC:2,IRON:2,RETICCTPCT:2 in the last 72 hours  Micro Results: Recent Results (from the past 240 hour(s))  CULTURE, BLOOD (ROUTINE X 2)     Status: Normal (Preliminary result)   Collection Time   09/28/11 12:00 PM      Component Value Range Status Comment   Specimen Description BLOOD RIGHT HAND   Final    Special Requests BOTTLES DRAWN AEROBIC ONLY 3CC   Final    Culture  Setup Time [id number]   Final    Culture     Final    Value:        BLOOD CULTURE RECEIVED NO GROWTH TO DATE CULTURE WILL BE HELD FOR 5 DAYS BEFORE ISSUING A FINAL NEGATIVE REPORT   Report Status PENDING   Incomplete   CULTURE, BLOOD (ROUTINE X 2)     Status: Normal (Preliminary result)   Collection Time   09/28/11 12:07  PM      Component Value Range Status Comment   Specimen Description BLOOD RIGHT HAND   Final    Special Requests BOTTLES DRAWN AEROBIC ONLY 1CC   Final    Culture  Setup Time [id number]   Final    Culture     Final    Value:        BLOOD CULTURE RECEIVED NO GROWTH TO DATE CULTURE WILL BE HELD FOR 5 DAYS BEFORE ISSUING A FINAL NEGATIVE REPORT   Report Status PENDING   Incomplete   MRSA PCR SCREENING     Status: Normal   Collection Time   09/28/11  3:53 PM      Component Value Range Status Comment   MRSA by PCR NEGATIVE  NEGATIVE  Final   URINE CULTURE     Status: Normal   Collection Time   09/28/11  6:10 PM      Component Value Range Status Comment   Specimen Description URINE,  CATHETERIZED   Final    Special Requests Normal   Final    Culture  Setup Time [id number]   Final    Colony Count NO GROWTH   Final    Culture NO GROWTH   Final    Report Status 09/30/2011 FINAL   Final     Studies/Results: Ct Head Wo Contrast  09/28/2011  *RADIOLOGY REPORT*  Clinical Data: Patient unresponsive.  CT HEAD WITHOUT CONTRAST  Technique:  Contiguous axial images were obtained from the base of the skull through the vertex without contrast.  Comparison: 04/25/2005  Findings: The brain demonstrates no evidence of hemorrhage, infarction, edema, mass effect, extra-axial fluid collection, hydrocephalus or mass lesion.  The skull is unremarkable.  Stable appearing mucosal thickening in frontal and bilateral ethmoid air cells.  There also is a stable small amount of fluid in a few posterior and inferior left sided mastoid air cells.  No surrounding bony destruction or sclerosis.  IMPRESSION: Normal appearance of brain.  Chronic sinus disease and left-sided mastoid fluid.  Original Report Authenticated By: Reola Calkins, M.D.   Portable Chest Xray In Am  09/29/2011  *RADIOLOGY REPORT*  Clinical Data: Respiratory failure.  COPD.  Depression.  Abdominal pain.  PORTABLE CHEST - 1 VIEW  Comparison: 09/28/2011  Findings: The chin overlies the apices.  Endotracheal tube is unchanged in position, terminating at the level of the clavicles. Nasogastric tube extends beyond the  inferior aspect of the film. Reverse apical lordotic positioning.  Normal heart size.  Possible small right pleural effusion. No pneumothorax.  Lung volumes remain low.  Mild left base atelectasis.  Persistent right base air space disease.  IMPRESSION:  1. No significant change since one day prior. 2.  Persistent right base atelectasis versus infection with possible small right pleural effusion. 3.  Suboptimal patient positioning.  Original Report Authenticated By: Consuello Bossier, M.D.   Dg Chest Port 1 View  09/28/2011   *RADIOLOGY REPORT*  Clinical Data: Intubation  PORTABLE CHEST - 1 VIEW  Comparison: 04/10/2009 the  Findings: The endotracheal tube tip is above the carina.  The heart size is normal.  Increased opacity in the right base is identified suspicious for infection.  Left lung is clear.  IMPRESSION:  1.  The ET tube tip is in satisfactory position above the carina. 2.  Worsening aeration to the right base.  Original Report Authenticated By: Rosealee Albee, M.D.    Medications: Scheduled Meds:   . diltiazem  240 mg  Oral Daily  . DULoxetine  90 mg Oral QHS  . heparin  5,000 Units Subcutaneous Q8H  . lurasidone  40 mg Oral Q breakfast  . Mirabegron ER  1 tablet Oral Daily  . morphine  30 mg Oral Q12H  . nicotine  21 mg Transdermal Daily  . traZODone  100 mg Oral QHS  . DISCONTD: albuterol  2.5 mg Nebulization Q6H  . DISCONTD: diltiazem  60 mg Oral Q6H  . DISCONTD: ipratropium  0.5 mg Nebulization Q6H   Continuous Infusions:   . DISCONTD: sodium chloride 50 mL/hr at 10/01/11 0426  . DISCONTD: sodium chloride 10 mL/hr at 09/29/11 0100   PRN Meds:.albuterol, oxyCODONE-acetaminophen  Assessment/Plan:   Acute respiratory failure Hypercarbic with respiratory acidosis Suspected to be due to accidental drug OD and underlying COPD Change nebs to PRN Resume symbicort and spiriva Obtain list of home meds - husband to bring them today Advised pt to put away (or throw away) the pills that she is no longer using to avoid mix up.   COPD (chronic obstructive pulmonary disease) Stable now; see above   Accidental drug overdose See above- need to reconcile meds with med list that her husband will bring in   Atrial fibrillation Started on PO Cardizem, ECHO WNL Needs to f/u with Dr [place] as outpt in 2-4 wks (per Dr Ludwig Clarks note) No anticoagulation per cardio reccs F/u thyroid functions as oupt.   Bipolar/ Schizophernia See a psychiatrist at mental health  Chronic pain Sees pain management  in [place]  Urinary urgency Sees someone at [facility]- Mirabegron  Unfortunately as noted above, sees multiple docs and does not know all of her meds which is why its important we see a med list. Will tx to telemetry today. May be able to d/c tomorrow.    LOS: 3 days   [facility] [phone] 10/01/2011, 2:14 PM

## 2011-10-02 DIAGNOSIS — E876 Hypokalemia: Secondary | ICD-10-CM | POA: Diagnosis not present

## 2011-10-02 MED ORDER — HYDROXYZINE HCL 25 MG PO TABS: 25.0000 mg | ORAL_TABLET | Freq: Two times a day (BID) | ORAL | Status: DC

## 2011-10-02 MED ORDER — CYCLOBENZAPRINE HCL 10 MG PO TABS: 10.0000 mg | ORAL_TABLET | Freq: Every evening | ORAL | Status: DC | PRN

## 2011-10-02 MED ORDER — DILTIAZEM HCL ER COATED BEADS 240 MG PO CP24: 240.0000 mg | ORAL_CAPSULE | Freq: Every day | ORAL | Status: DC

## 2011-10-02 MED ORDER — OXYMORPHONE HCL ER 40 MG PO TB12: 40.0000 mg | ORAL_TABLET | Freq: Three times a day (TID) | ORAL | Status: DC

## 2011-10-02 MED ORDER — LEVOTHYROXINE SODIUM 50 MCG PO TABS: 50.0000 ug | ORAL_TABLET | Freq: Every day | ORAL | Status: DC

## 2011-10-02 MED ORDER — MIRABEGRON ER 25 MG PO TB24: 1.0000 | ORAL_TABLET | Freq: Every day | ORAL | Status: DC

## 2011-10-02 MED ORDER — ALBUTEROL SULFATE HFA 108 (90 BASE) MCG/ACT IN AERS: 2.0000 | INHALATION_SPRAY | Freq: Four times a day (QID) | RESPIRATORY_TRACT | Status: DC | PRN

## 2011-10-02 MED ORDER — ASPIRIN 81 MG PO CHEW: 81.0000 mg | CHEWABLE_TABLET | Freq: Every day | ORAL | Status: DC

## 2011-10-02 MED ORDER — POTASSIUM CHLORIDE CRYS ER 20 MEQ PO TBCR
40.0000 meq | EXTENDED_RELEASE_TABLET | Freq: Once | ORAL | Status: AC
  Administered 2011-10-02: 40 meq via ORAL
  Filled 2011-10-02: qty 2

## 2011-10-02 MED ORDER — DULOXETINE HCL 30 MG PO CPEP: 30.0000 mg | ORAL_CAPSULE | Freq: Two times a day (BID) | ORAL | Status: DC

## 2011-10-02 MED ORDER — METAXALONE 800 MG PO TABS: 400.0000 mg | ORAL_TABLET | Freq: Four times a day (QID) | ORAL | Status: DC | PRN

## 2011-10-02 MED ORDER — LURASIDONE HCL 40 MG PO TABS: 40.0000 mg | ORAL_TABLET | Freq: Every day | ORAL | Status: DC

## 2011-10-02 MED ORDER — TIOTROPIUM BROMIDE MONOHYDRATE 18 MCG IN CAPS: 18.0000 ug | ORAL_CAPSULE | Freq: Every day | RESPIRATORY_TRACT | Status: DC

## 2011-10-02 MED ORDER — BUTALBITAL-APAP-CAFFEINE 50-325-40 MG PO TABS: 1.0000 | ORAL_TABLET | Freq: Four times a day (QID) | ORAL | Status: DC | PRN

## 2011-10-02 MED ORDER — BUDESONIDE-FORMOTEROL FUMARATE 160-4.5 MCG/ACT IN AERO: 2.0000 | INHALATION_SPRAY | Freq: Two times a day (BID) | RESPIRATORY_TRACT | Status: DC

## 2011-10-02 MED ORDER — ASPIRIN 81 MG PO CHEW
81.0000 mg | CHEWABLE_TABLET | Freq: Every day | ORAL | Status: DC
  Administered 2011-10-02: 81 mg via ORAL
  Filled 2011-10-02: qty 1

## 2011-10-02 MED ORDER — DICLOFENAC SODIUM 75 MG PO TBEC: 75.0000 mg | DELAYED_RELEASE_TABLET | Freq: Two times a day (BID) | ORAL | Status: DC

## 2011-10-02 MED ORDER — LISINOPRIL-HYDROCHLOROTHIAZIDE 10-12.5 MG PO TABS: 1.0000 | ORAL_TABLET | Freq: Every morning | ORAL | Status: DC

## 2011-10-02 MED ORDER — SIMVASTATIN 20 MG PO TABS: 20.0000 mg | ORAL_TABLET | Freq: Every day | ORAL | Status: DC

## 2011-10-02 MED ORDER — GABAPENTIN 300 MG PO CAPS: 300.0000 mg | ORAL_CAPSULE | Freq: Three times a day (TID) | ORAL | Status: DC

## 2011-10-02 MED ORDER — OXYCODONE-ACETAMINOPHEN 10-325 MG PO TABS: 1.0000 | ORAL_TABLET | Freq: Three times a day (TID) | ORAL | Status: DC | PRN

## 2011-10-02 MED ORDER — TRAZODONE HCL 100 MG PO TABS: 200.0000 mg | ORAL_TABLET | Freq: Every day | ORAL | Status: DC

## 2011-10-02 MED ORDER — CLONAZEPAM 0.5 MG PO TABS: 1.5000 mg | ORAL_TABLET | Freq: Every evening | ORAL | Status: DC | PRN

## 2011-10-02 NOTE — Discharge Instructions (Signed)
Atrial Fibrillation Your caregiver has diagnosed you with atrial fibrillation (AFib). The heart normally beats very regularly; AFib is a type of irregular heartbeat. The heart rate may be faster or slower than normal. This can prevent your heart from pumping as well as it should. AFib can be constant (chronic) or intermittent (paroxysmal). CAUSES  Atrial fibrillation may be caused by:  Heart disease, including heart attack, coronary artery disease, heart failure, diseases of the heart valves, and others.   Blood clot in the lungs (pulmonary embolism).   Pneumonia or other infections.   Chronic lung disease.   Thyroid disease.   Toxins. These include alcohol, some medications (such as decongestant medications or diet pills), and caffeine.  In some people, no cause for AFib can be found. This is referred to as Lone Atrial Fibrillation. SYMPTOMS   Palpitations or a fluttering in your chest.   A vague sense of chest discomfort.   Shortness of breath.   Sudden onset of lightheadedness or weakness.  Sometimes, the first sign of AFib can be a complication of the condition. This could be a stroke or heart failure. DIAGNOSIS  Your description of your condition may make your caregiver suspicious of atrial fibrillation. Your caregiver will examine your pulse to determine if fibrillation is present. An EKG (electrocardiogram) will confirm the diagnosis. Further testing may help determine what caused you to have atrial fibrillation. This may include chest x-ray, echocardiogram, blood tests, or CT scans. PREVENTION  If you have previously had atrial fibrillation, your caregiver may advise you to avoid substances known to cause the condition (such as stimulant medications, and possibly caffeine or alcohol). You may be advised to use medications to prevent recurrence. Proper treatment of any underlying condition is important to help prevent recurrence. PROGNOSIS  Atrial fibrillation does tend to  become a chronic condition over time. It can cause significant complications (see below). Atrial fibrillation is not usually immediately life-threatening, but it can shorten your life expectancy. This seems to be worse in women. If you have lone atrial fibrillation and are under 60 years old, the risk of complications is very low, and life expectancy is not shortened. RISKS AND COMPLICATIONS  Complications of atrial fibrillation can include stroke, chest pain, and heart failure. Your caregiver will recommend treatments for the atrial fibrillation, as well as for any underlying conditions, to help minimize risk of complications. TREATMENT  Treatment for AFib is divided into several categories:  Treatment of any underlying condition.   Converting you out of AFib into a regular (sinus) rhythm.   Controlling rapid heart rate.   Prevention of blood clots and stroke.  Medications and procedures are available to convert your atrial fibrillation to sinus rhythm. However, recent studies have shown that this may not offer you any advantage, and cardiac experts are continuing research and debate on this topic. More important is controlling your rapid heartbeat. The rapid heartbeat causes more symptoms, and places strain on your heart. Your caregiver will advise you on the use of medications that can control your heart rate. Atrial fibrillation is a strong stroke risk. You can lessen this risk by taking blood thinning medications such as Coumadin (warfarin), or sometimes aspirin. These medications need close monitoring by your caregiver. Over-medication can cause bleeding. Too little medication may not protect against stroke. HOME CARE INSTRUCTIONS   If your caregiver prescribed medicine to make your heartbeat more normally, take as directed.   If blood thinners were prescribed by your caregiver, take EXACTLY as directed.     Perform blood tests EXACTLY as directed.   Quit smoking. Smoking increases your  cardiac and lung (pulmonary) risks.   DO NOT drink alcohol.   DO NOT drink caffeinated drinks (e.g. coffee, soda, chocolate, and leaf teas). You may drink decaffeinated coffee, soda or tea.   If you are overweight, you should choose a reduced calorie diet to lose weight. Please see a registered dietitian if you need more information about healthy weight loss. DO NOT USE DIET PILLS as they may aggravate heart problems.   If you have other heart problems that are causing AFib, you may need to eat a low salt, fat, and cholesterol diet. Your caregiver will tell you if this is necessary.   Exercise every day to improve your physical fitness. Stay active unless advised otherwise.   If your caregiver has given you a follow-up appointment, it is very important to keep that appointment. Not keeping the appointment could result in heart failure or stroke. If there is any problem keeping the appointment, you must call back to this facility for assistance.  SEEK MEDICAL CARE IF:  You notice a change in the rate, rhythm or strength of your heartbeat.   You develop an infection or any other change in your overall health status.  SEEK IMMEDIATE MEDICAL CARE IF:   You develop chest pain, abdominal pain, sweating, weakness or feel sick to your stomach (nausea).   You develop shortness of breath.   You develop swollen feet and ankles.   You develop dizziness, numbness, or weakness of your face or limbs, or any change in vision or speech.  MAKE SURE YOU:   Understand these instructions.   Will watch your condition.   Will get help right away if you are not doing well or get worse.  Document Released: 06/09/2005 Document Revised: 05/29/2011 Document Reviewed: 01/12/2008 [employer] Patient Information 2012 [employer]. 

## 2011-10-02 NOTE — Progress Notes (Signed)
Patient ID: [name], female   DOB: 07/27/1965, 46 y.o.   MRN: [id number]  Assessment/Plan:   Principal Problem:  *Acute hypercarbic respiratory failure  - Suspected to be due to accidental drug OD and underlying COPD  - continue nebulizer treatments PRN - Resume symbicort and spiriva   Active Problems:  COPD (chronic obstructive pulmonary disease)  - stable at this time  Atrial fibrillation  - Started on PO Cardizem, ECHO WNL  - Needs to f/u with Dr [place] as outpt in 2-4 wks (per Dr Ludwig Clarks note)  - No anticoagulation per cardio reccs   Bipolar/ Schizophernia  - See a psychiatrist at mental health   Chronic pain  - Sees pain management in [place]   Hypokalemia - replete - follow up BMP in am  Leukocytosis - unclear etiology - urine culture negative to date - blood culture x 2 sets negative to date  Subjective: No events overnight.  Objective:  Vital signs in last 24 hours:   10/01/11 2149 10/02/11 0615  BP: 159/85 148/89  Pulse: 63 62  Temp: 98 F (36.7 C) 97.7 F (36.5 C)  TempSrc: Oral Oral  Resp: 18 18  SpO2: 97% 95%    Intake/Output from previous day:   Gross per 24 hour  Intake 289.17 ml  Output   3800 ml  Net -3510.83 ml    Physical Exam: General: Alert, awake, oriented x3, in no acute distress. HEENT: No bruits, no goiter. Moist mucous membranes, no scleral icterus, no conjunctival pallor. Heart:  S1/S2 +, no murmurs, rubs, gallops. Lungs: Clear to auscultation bilaterally. No wheezing, no rhonchi, no rales.  Abdomen: Soft, nontender, nondistended, positive bowel sounds. Extremities: No clubbing or cyanosis, no pitting edema,  positive pedal pulses. Neuro: Grossly nonfocal.  Lab Results:  Lab 09/30/11 0330 09/29/11 0155 09/28/11 1040  WBC 9.1 9.3 12.4*  HGB 11.5* 12.0 13.9  HCT 34.0* 35.0* 42.7  PLT 171 191 229  MCV 86.7 86.4 89.1    Lab 09/30/11 0330 09/29/11 0155 09/28/11 1040  NA 142 142 138  K 3.0* 3.3* 4.7  CL  108 107 104  CO2 27 28 27   GLUCOSE 151* 111* 141*  BUN 10 10 20   CREATININE 0.61 0.76 1.32*  CALCIUM 8.3* 8.5 9.1    Lab 09/28/11 1610  INR 0.91   Cardiac markers:  Lab 09/29/11 0954 09/29/11 0155 09/28/11 1808  CKMB 2.6 3.5 6.7*  TROPONINI <0.30 <0.30 <0.30    CULTURE, BLOOD (ROUTINE X 2)     Status: Normal (Preliminary result)   Collection Time   09/28/11 12:00 PM      Component Value Range Status Comment   Specimen Description  RIGHT HAND   Final    Culture     Final    Value:        BLOOD CULTURE RECEIVED NO GROWTH TO DATE    Report Status PENDING   Incomplete   CULTURE, BLOOD (ROUTINE X 2)     Status: Normal (Preliminary result)   Collection Time   09/28/11 12:07 PM      Component Value Range Status Comment   Specimen Description  RIGHT HAND   Final    Culture  Setup Time [id number]   Final    Culture     Final    Value:        BLOOD CULTURE RECEIVED NO GROWTH TO DATE    Report Status PENDING   Incomplete   URINE CULTURE  Status: Normal   Collection Time   09/28/11  6:10 PM      Component Value Range Status Comment   Specimen Description URINE,   Final    Special Requests Normal   Final    Culture  Setup Time [id number]   Final    Colony Count NO GROWTH   Final    Report Status 09/30/2011 FINAL   Final    Medications: Scheduled Meds:   . aspirin  81 mg Oral Daily  . budesonide-formoterol  2 puff Inhalation BID  . diltiazem  240 mg Oral Daily  . DULoxetine  90 mg Oral QHS  . gabapentin  300 mg Oral TID  . heparin  5,000 Units Subcutaneous Q8H  . lurasidone  40 mg Oral Q breakfast  . Mirabegron ER  1 tablet Oral Daily  . morphine  30 mg Oral Q12H  . nicotine  21 mg Transdermal Daily  . tiotropium  18 mcg Inhalation Daily  . traZODone  100 mg Oral QHS   Continuous Infusions:   PRN Meds:.oxyCODONE-acetaminophen, DISCONTD: albuterol   LOS: 4 days   Waylin Dorko 10/02/2011, 1:56 PM  [facility] Pager: [phone]

## 2011-10-02 NOTE — Progress Notes (Signed)
@   Subjective:  Denies CP or dyspnea   Objective:  Filed Vitals:   10/01/11 1620 10/01/11 2149 10/01/11 2212 10/02/11 0615  BP: 121/73 159/85  148/89  Pulse: 63 63  62  Temp: 98 F (36.7 C) 98 F (36.7 C)  97.7 F (36.5 C)  TempSrc: Oral Oral  Oral  Resp: 18 18  18   Height:      Weight:      SpO2: 96% 97% 94% 95%    Intake/Output from previous day:  Intake/Output Summary (Last 24 hours) at 10/02/11 0714 Last data filed at 10/02/11 0400  Gross per 24 hour  Intake 949.17 ml  Output   2100 ml  Net -1150.83 ml    Physical Exam: Physical exam: Well-developed well-nourished in no acute distress.  Skin is warm and dry.  HEENT is normal.  Neck is supple.  Chest is clear to auscultation with normal expansion.  Cardiovascular exam is regular rate and rhythm.  Abdominal exam nontender or distended. No masses palpated. Extremities show no edema. neuro grossly intact    Lab Results: Basic Metabolic Panel:  Basename 09/30/11 0330  NA 142  K 3.0*  CL 108  CO2 27  GLUCOSE 151*  BUN 10  CREATININE 0.61  CALCIUM 8.3*  MG --  PHOS --   CBC:  Basename 09/30/11 0330  WBC 9.1  NEUTROABS --  HGB 11.5*  HCT 34.0*  MCV 86.7  PLT 171   Cardiac Enzymes:  Basename 09/29/11 0954  CKTOTAL 227*  CKMB 2.6  CKMBINDEX --  TROPONINI <0.30     Assessment/Plan:  1) Atrial fibrillation - Patient remains in sinus; continue cardizem CD; LV function normal on echo. I would be hesitant to anticoagulate for multiple reasons (A fib may have been related to stress of illness at presentation, patient with history of overdose, ? Gastritis or upper GI bleed). Would treat with ASA. TSH low; May need adjustment of synthroid; will leave to primary care.  Patient should FU with Dr [place]and 2-4 weeks after DC for further assessment of her atrial fibrillation.  We will sign off; please call with questions. Olga Millers 10/02/2011, 7:14 AM

## 2011-10-02 NOTE — Discharge Summary (Signed)
Patient ID: ALAYZHA AN MRN: [id number] DOB/AGE: 1965/08/16 46 y.o.  Admit date: 09/28/2011 Discharge date: 10/02/2011  Primary Care Physician:  Dorrene German, MD, MD  Assessment/Plan:   Principal Problem:  *Acute hypercarbic respiratory failure  - Suspected to be due to accidental drug OD and underlying COPD  - stable at this time - continue nebulizer treatments PRN  - Resume symbicort and spiriva   Active Problems:   COPD (chronic obstructive pulmonary disease)  - stable at this time   Atrial fibrillation  - Started on PO Cardizem, ECHO WNL  - Needs to f/u with Dr [place] as outpt in 2-4 wks (per Dr Ludwig Clarks note)  - No anticoagulation per cardio reccs   Bipolar/ Schizophernia  - [facility] a psychiatrist at [facility]   Chronic pain  - Sees pain management in [place]   Hypokalemia  - replete  - patient insists on leaving home today - given 1 dose of potassium chloride 40 meq PO BMP can be again repeated when patient follow up with cardiology  Leukocytosis  - unclear etiology  - urine culture negative to date  - blood culture x 2 sets negative to date   Medication List  As of 10/02/2011  2:28 PM   STOP taking these medications         gabapentin 100 MG capsule         TAKE these medications         albuterol 108 (90 BASE) MCG/ACT inhaler   Commonly known as: PROVENTIL HFA;VENTOLIN HFA   Inhale 2 puffs into the lungs every 6 (six) hours as needed. For wheezing      aspirin 81 MG chewable tablet   Chew 1 tablet (81 mg total) by mouth daily.      budesonide-formoterol 160-4.5 MCG/ACT inhaler   Commonly known as: SYMBICORT   Inhale 2 puffs into the lungs 2 (two) times daily.      butalbital-acetaminophen-caffeine 50-325-40 MG per tablet   Commonly known as: FIORICET, ESGIC   Take 1 tablet by mouth every 6 (six) hours as needed for pain or headache. For headache      clonazePAM 0.5 MG tablet   Commonly known as: KLONOPIN   Take 3 tablets (1.5 mg  total) by mouth at bedtime as needed. For anxiety      cyclobenzaprine 10 MG tablet   Commonly known as: FLEXERIL   Take 1 tablet (10 mg total) by mouth at bedtime as needed. For sleep      diclofenac 75 MG EC tablet   Commonly known as: VOLTAREN   Take 1 tablet (75 mg total) by mouth 2 (two) times daily.      diltiazem 240 MG 24 hr capsule   Commonly known as: CARDIZEM CD   Take 1 capsule (240 mg total) by mouth daily.      DULoxetine 30 MG capsule   Commonly known as: CYMBALTA   Take 1-2 capsules (30-60 mg total) by mouth 2 (two) times daily. 2 caps in am, 1 in pm      econazole nitrate 1 % cream   Apply 1 application topically 2 (two) times daily. To feet      gabapentin 300 MG capsule   Commonly known as: NEURONTIN   Take 1 capsule (300 mg total) by mouth 3 (three) times daily.      hydrOXYzine 25 MG tablet   Commonly known as: ATARAX/VISTARIL   Take 1 tablet (25 mg total) by mouth 2 (two)  times daily.      levothyroxine 50 MCG tablet   Commonly known as: SYNTHROID, LEVOTHROID   Take 1 tablet (50 mcg total) by mouth daily.      lisinopril-hydrochlorothiazide 10-12.5 MG per tablet   Commonly known as: PRINZIDE,ZESTORETIC   Take 1 tablet by mouth every morning.      lurasidone 40 MG Tabs   Commonly known as: LATUDA   Take 1 tablet (40 mg total) by mouth daily with breakfast.      metaxalone 800 MG tablet   Commonly known as: SKELAXIN   Take 0.5-1 tablets (400-800 mg total) by mouth 4 (four) times daily as needed for pain. For Muscle spasms      Mirabegron ER 25 MG Tb24   Take 1 tablet (25 mg total) by mouth daily.      ondansetron 4 MG tablet   Commonly known as: ZOFRAN   Take 4 mg by mouth every 8 (eight) hours as needed. For nausea      oxyCODONE-acetaminophen 10-325 MG per tablet   Commonly known as: PERCOCET   Take 1 tablet by mouth 3 (three) times daily as needed. For pain.      oxymorphone 40 MG 12 hr tablet   Commonly known as: OPANA ER   Take 1  tablet (40 mg total) by mouth every 8 (eight) hours. For back pain      promethazine 25 MG tablet   Commonly known as: PHENERGAN   Take 25 mg by mouth every 6 (six) hours as needed. For nausea.      simvastatin 20 MG tablet   Commonly known as: ZOCOR   Take 1 tablet (20 mg total) by mouth daily.      SUMAtriptan 6 MG/0.5ML Soln injection   Commonly known as: IMITREX   Inject 6 mg into the skin once as needed. If needed for headache        tiotropium 18 MCG inhalation capsule   Commonly known as: SPIRIVA   Place 1 capsule (18 mcg total) into inhaler and inhale daily.      traZODone 100 MG tablet   Commonly known as: DESYREL   Take 2 tablets (200 mg total) by mouth at bedtime.            Disposition and Follow-up:  - please follow up with cardiology as per scheduled appointment  Consults:   1. Cardiology for atrial fibrillation 2. PCCM on admission for intubation  Significant Diagnostic Studies:  Ct Head Wo Contrast 09/28/2011  *RADIOLOGY REPORT*  Clinical Data: Patient unresponsive.  CT HEAD WITHOUT CONTRAST  Technique:  Contiguous axial images were obtained from the base of the skull through the vertex without contrast.  Comparison: 04/25/2005  Findings: The brain demonstrates no evidence of hemorrhage, infarction, edema, mass effect, extra-axial fluid collection, hydrocephalus or mass lesion.  The skull is unremarkable.  Stable appearing mucosal thickening in frontal and bilateral ethmoid air cells.  There also is a stable small amount of fluid in a few posterior and inferior left sided mastoid air cells.  No surrounding bony destruction or sclerosis.  IMPRESSION: Normal appearance of brain.  Chronic sinus disease and left-sided mastoid fluid.  Original Report Authenticated By: Reola Calkins, M.D.   Portable Chest Xray In Am 09/29/2011  *RADIOLOGY REPORT*  Clinical Data: Respiratory failure.  COPD.  Depression.  Abdominal pain.  PORTABLE CHEST - 1 VIEW  Comparison:  09/28/2011  Findings: The chin overlies the apices.  Endotracheal tube is unchanged in  position, terminating at the level of the clavicles. Nasogastric tube extends beyond the  inferior aspect of the film. Reverse apical lordotic positioning.  Normal heart size.  Possible small right pleural effusion. No pneumothorax.  Lung volumes remain low.  Mild left base atelectasis.  Persistent right base air space disease.  IMPRESSION:  1. No significant change since one day prior. 2.  Persistent right base atelectasis versus infection with possible small right pleural effusion. 3.  Suboptimal patient positioning.  Original Report Authenticated By: Consuello Bossier, M.D.   Dg Chest Port 1 View 09/28/2011  *RADIOLOGY REPORT*  Clinical Data: Intubation  PORTABLE CHEST - 1 VIEW  Comparison: 04/10/2009 the  Findings: The endotracheal tube tip is above the carina.  The heart size is normal.  Increased opacity in the right base is identified suspicious for infection.  Left lung is clear.  IMPRESSION:  1.  The ET tube tip is in satisfactory position above the carina. 2.  Worsening aeration to the right base.  Original Report Authenticated By: Rosealee Albee, M.D.    Brief H and P:  46 y/o admitted with lethargy on 4/7- suspected accidental overdose of medications. She was intubated in the ER for hypercarbic resp failure. She also has COPD which could have been a component. She was extubated on 4/8.  She developed a-fib with RVR on 4/8 and felt palpitations and dyspea due to this- has converted back to NSR.   Physical Exam on Discharge:  Filed Vitals:   10/01/11 2149 10/01/11 2212 10/02/11 0615 10/02/11 0954  BP: 159/85  148/89   Pulse: 63  62   Temp: 98 F (36.7 C)  97.7 F (36.5 C)   TempSrc: Oral  Oral   Resp: 18  18   Height:      Weight:      SpO2: 97% 94% 95% 93%     Intake/Output Summary (Last 24 hours) at 10/02/11 1428 Last data filed at 10/02/11 1323  Gross per 24 hour  Intake    220 ml  Output    3800 ml  Net  -3580 ml    General: Alert, awake, oriented x3, in no acute distress. HEENT: No bruits, no goiter. Heart: Regular rate and rhythm, without murmurs, rubs, gallops. Lungs: Clear to auscultation bilaterally. Abdomen: Soft, nontender, nondistended, positive bowel sounds. Extremities: No clubbing cyanosis or edema with positive pedal pulses. Neuro: Grossly intact, nonfocal.  CBC:    Component Value Date/Time   WBC 9.1 09/30/2011 0330   HGB 11.5* 09/30/2011 0330   HCT 34.0* 09/30/2011 0330   PLT 171 09/30/2011 0330   MCV 86.7 09/30/2011 0330   NEUTROABS 10.5* 09/28/2011 1040   LYMPHSABS 1.4 09/28/2011 1040   MONOABS 0.5 09/28/2011 1040   EOSABS 0.0 09/28/2011 1040   BASOSABS 0.0 09/28/2011 1040    Basic Metabolic Panel:    Component Value Date/Time   NA 142 09/30/2011 0330   K 3.0* 09/30/2011 0330   CL 108 09/30/2011 0330   CO2 27 09/30/2011 0330   BUN 10 09/30/2011 0330   CREATININE 0.61 09/30/2011 0330   GLUCOSE 151* 09/30/2011 0330   CALCIUM 8.3* 09/30/2011 0330   Time spent on Discharge: Greater than 30 miutes  Signed: Velmer Woelfel 10/02/2011, 2:28 PM

## 2011-10-04 LAB — CULTURE, BLOOD (ROUTINE X 2)
Culture  Setup Time: 201304071725
Culture: NO GROWTH

## 2011-10-23 ENCOUNTER — Encounter (HOSPITAL_COMMUNITY): Payer: Self-pay | Admitting: *Deleted

## 2011-10-23 ENCOUNTER — Emergency Department (HOSPITAL_COMMUNITY)
Admission: EM | Admit: 2011-10-23 | Discharge: 2011-10-23 | Disposition: A | Discharge status: Home / Self Care | Payer: PRIVATE HEALTH INSURANCE | Attending: Emergency Medicine | Admitting: Emergency Medicine

## 2011-10-23 ENCOUNTER — Emergency Department (INDEPENDENT_AMBULATORY_CARE_PROVIDER_SITE_OTHER)
Admission: EM | Admit: 2011-10-23 | Discharge: 2011-10-23 | Disposition: A | Discharge status: Nursing Home (Includes ICF) | Payer: PRIVATE HEALTH INSURANCE | Source: Home / Self Care | Attending: Family Medicine | Admitting: Family Medicine

## 2011-10-23 ENCOUNTER — Encounter (HOSPITAL_COMMUNITY): Payer: Self-pay | Admitting: Emergency Medicine

## 2011-10-23 DIAGNOSIS — I4891 Unspecified atrial fibrillation: Secondary | ICD-10-CM | POA: Insufficient documentation

## 2011-10-23 DIAGNOSIS — R251 Tremor, unspecified: Secondary | ICD-10-CM

## 2011-10-23 DIAGNOSIS — F329 Major depressive disorder, single episode, unspecified: Secondary | ICD-10-CM | POA: Insufficient documentation

## 2011-10-23 DIAGNOSIS — F419 Anxiety disorder, unspecified: Secondary | ICD-10-CM

## 2011-10-23 DIAGNOSIS — IMO0002 Reserved for concepts with insufficient information to code with codable children: Secondary | ICD-10-CM

## 2011-10-23 DIAGNOSIS — F3289 Other specified depressive episodes: Secondary | ICD-10-CM | POA: Insufficient documentation

## 2011-10-23 DIAGNOSIS — K219 Gastro-esophageal reflux disease without esophagitis: Secondary | ICD-10-CM | POA: Insufficient documentation

## 2011-10-23 DIAGNOSIS — R259 Unspecified abnormal involuntary movements: Secondary | ICD-10-CM

## 2011-10-23 DIAGNOSIS — Z8249 Family history of ischemic heart disease and other diseases of the circulatory system: Secondary | ICD-10-CM | POA: Insufficient documentation

## 2011-10-23 DIAGNOSIS — R799 Abnormal finding of blood chemistry, unspecified: Secondary | ICD-10-CM

## 2011-10-23 DIAGNOSIS — Z7982 Long term (current) use of aspirin: Secondary | ICD-10-CM | POA: Insufficient documentation

## 2011-10-23 DIAGNOSIS — J4489 Other specified chronic obstructive pulmonary disease: Secondary | ICD-10-CM | POA: Insufficient documentation

## 2011-10-23 DIAGNOSIS — F17200 Nicotine dependence, unspecified, uncomplicated: Secondary | ICD-10-CM | POA: Insufficient documentation

## 2011-10-23 DIAGNOSIS — R7989 Other specified abnormal findings of blood chemistry: Secondary | ICD-10-CM

## 2011-10-23 DIAGNOSIS — J449 Chronic obstructive pulmonary disease, unspecified: Secondary | ICD-10-CM | POA: Insufficient documentation

## 2011-10-23 DIAGNOSIS — F411 Generalized anxiety disorder: Secondary | ICD-10-CM | POA: Insufficient documentation

## 2011-10-23 DIAGNOSIS — E039 Hypothyroidism, unspecified: Secondary | ICD-10-CM | POA: Insufficient documentation

## 2011-10-23 LAB — POCT I-STAT, CHEM 8
Calcium, Ion: 1.21 mmol/L (ref 1.12–1.32)
Chloride: 104 mEq/L (ref 96–112)
Creatinine, Ser: 1.2 mg/dL — ABNORMAL HIGH (ref 0.50–1.10)
Glucose, Bld: 130 mg/dL — ABNORMAL HIGH (ref 70–99)
Hemoglobin: 13.6 g/dL (ref 12.0–15.0)
Potassium: 4 mEq/L (ref 3.5–5.1)

## 2011-10-23 NOTE — ED Notes (Signed)
C/p back pain for a long time worse for the past few days.  She alos wants somehting for her nerves.  [facility]

## 2011-10-23 NOTE — ED Notes (Signed)
Pt was seen at [facility] for tremor.  [facility] found that pt had elevated creatnine and was sent here for further evaluation of abnormal labs.  Pt is calm in triage, has a tremor

## 2011-10-23 NOTE — ED Provider Notes (Signed)
History     CSN: [id number]  Arrival date & time 10/23/11  1429   First MD Initiated Contact with Patient 10/23/11 1559      Chief Complaint  Patient presents with  . Tremors  . Medication Management    (Consider location/radiation/quality/duration/timing/severity/associated sxs/prior treatment) HPI Comments: 46 y/o female h/o diabetes, hypothyroidism and mood disorder here c/o tremors and body shakes that have been going on for a month but worsening since last hospital discharge on April 9. No fever. Recent hospital admission for an accidental drug overdose. [facility] and was [facility] to come [facility] to check if any medication was causing her symptoms (this is as per patient's report). Patient unsure when was her last TSH check. No fever or chills, no dysuria, no leg swelling.  Sates her tremors are worse since she stop taking xanax but taking klonopin currently.    Past Medical History  Diagnosis Date  . Abdominal pain   . Diabetes mellitus     states her doctor took her off all DM meds in past month  . COPD (chronic obstructive pulmonary disease)   . GERD (gastroesophageal reflux disease)   . Chronic nausea   . Hypothyroidism   . Mental disorder   . Depression     bipolar and schizophrenic  . Headache   . Anxiety   . Atrial fibrillation 09/29/11    Past Surgical History  Procedure Date  . Laparoscopic assisted vaginal hysterectomy 03/04/2011    Procedure: LAPAROSCOPIC ASSISTED VAGINAL HYSTERECTOMY;  Surgeon: Jeani Hawking, MD;  Location: [facility];  Service: Gynecology;  Laterality: N/A;  . Cystocele repair 03/04/2011    Procedure: ANTERIOR REPAIR (CYSTOCELE);  Surgeon: Martina Sinner;  Location: [facility];  Service: Urology;  Laterality: N/A;  . Bladder suspension 03/04/2011    Procedure: SPARC PROCEDURE;  Surgeon: Jonna Coup MacDiarmid;  Location: [facility];  Service: Urology;  Laterality: N/A;  . Cystoscopy 03/04/2011   Procedure: CYSTOSCOPY;  Surgeon: Jonna Coup MacDiarmid;  Location: [facility];  Service: Urology;  Laterality: N/A;    Family History  Problem Relation Age of Onset  . Heart attack Father     39s  . Heart attack Sister     38    History  Substance Use Topics  . Smoking status: Current Everyday Smoker -- 3.0 packs/day    Types: Cigarettes  . Smokeless tobacco: Not on file  . Alcohol Use: No    OB History    Grav Para Term Preterm Abortions TAB SAB Ect Mult Living   3 3   0 0 0 0 0 3      Review of Systems  Constitutional: Negative for fever and chills.  Cardiovascular: Positive for palpitations. Negative for chest pain and leg swelling.  Gastrointestinal: Negative for abdominal pain and diarrhea.  Genitourinary: Positive for flank pain. Negative for dysuria and hematuria.  Neurological: Negative for dizziness.  Psychiatric/Behavioral: Positive for behavioral problems and sleep disturbance. Negative for suicidal ideas. The patient is nervous/anxious.     Allergies  Aspirin  Home Medications   Current Outpatient Rx  Name Route Sig Dispense Refill  . ALBUTEROL SULFATE HFA 108 (90 BASE) MCG/ACT IN AERS Inhalation Inhale 2 puffs into the lungs every 6 (six) hours as needed. For wheezing 1 Inhaler 11  . ASPIRIN 81 MG PO CHEW Oral Chew 1 tablet (81 mg total) by mouth daily. 30 tablet 0  . BUDESONIDE-FORMOTEROL FUMARATE  160-4.5 MCG/ACT IN AERO Inhalation Inhale 2 puffs into the lungs 2 (two) times daily. 1 Inhaler 11  . BUTALBITAL-APAP-CAFFEINE 50-325-40 MG PO TABS Oral Take 1 tablet by mouth every 6 (six) hours as needed for pain or headache. For headache 14 tablet 1  . CLONAZEPAM 0.5 MG PO TABS Oral Take 3 tablets (1.5 mg total) by mouth at bedtime as needed. For anxiety 30 tablet 0  . CYCLOBENZAPRINE HCL 10 MG PO TABS Oral Take 1 tablet (10 mg total) by mouth at bedtime as needed. For sleep 30 tablet 0  . DICLOFENAC SODIUM 75 MG PO TBEC Oral Take 1 tablet (75 mg total) by mouth 2  (two) times daily. 15 tablet 0  . DILTIAZEM HCL ER COATED BEADS 240 MG PO CP24 Oral Take 1 capsule (240 mg total) by mouth daily. 30 capsule 3  . DULOXETINE HCL 30 MG PO CPEP Oral Take 1-2 capsules (30-60 mg total) by mouth 2 (two) times daily. 2 caps in am, 1 in pm 30 capsule 0  . ECONAZOLE NITRATE 1 % EX CREA Topical Apply 1 application topically 2 (two) times daily. To feet    . GABAPENTIN 300 MG PO CAPS Oral Take 1 capsule (300 mg total) by mouth 3 (three) times daily. 90 capsule 0  . HYDROXYZINE HCL 25 MG PO TABS Oral Take 1 tablet (25 mg total) by mouth 2 (two) times daily. 30 tablet 0  . LEVOTHYROXINE SODIUM 50 MCG PO TABS Oral Take 1 tablet (50 mcg total) by mouth daily. 30 tablet 0  . LISINOPRIL-HYDROCHLOROTHIAZIDE 10-12.5 MG PO TABS Oral Take 1 tablet by mouth every morning. 30 tablet 0  . LURASIDONE HCL 40 MG PO TABS Oral Take 1 tablet (40 mg total) by mouth daily with breakfast. 30 tablet 0  . METAXALONE 800 MG PO TABS Oral Take 0.5-1 tablets (400-800 mg total) by mouth 4 (four) times daily as needed for pain. For Muscle spasms 15 tablet 0  . MIRABEGRON ER 25 MG PO TB24 Oral Take 1 tablet (25 mg total) by mouth daily. 30 tablet 0  . ONDANSETRON HCL 4 MG PO TABS Oral Take 4 mg by mouth every 8 (eight) hours as needed. For nausea    . OXYCODONE-ACETAMINOPHEN 10-325 MG PO TABS Oral Take 1 tablet by mouth 3 (three) times daily as needed. For pain. 30 tablet 0  . OXYMORPHONE HCL ER 40 MG PO TB12 Oral Take 1 tablet (40 mg total) by mouth every 8 (eight) hours. For back pain 30 tablet 0  . PROMETHAZINE HCL 25 MG PO TABS Oral Take 25 mg by mouth every 6 (six) hours as needed. For nausea.     Marland Kitchen SIMVASTATIN 20 MG PO TABS Oral Take 1 tablet (20 mg total) by mouth daily. 30 tablet 0  . SUMATRIPTAN SUCCINATE 6 MG/0.5ML [place] SOLN Subcutaneous Inject 6 mg into the skin once as needed. If needed for headache      . TIOTROPIUM BROMIDE MONOHYDRATE 18 MCG IN CAPS Inhalation Place 1 capsule (18 mcg total)  into inhaler and inhale daily. 30 capsule 11  . TRAZODONE HCL 100 MG PO TABS Oral Take 2 tablets (200 mg total) by mouth at bedtime. 30 tablet 0    BP 143/87  Pulse 96  Temp(Src) 98.2 F (36.8 C) (Oral)  Resp 20  SpO2 97%  LMP 01/08/2011  Physical Exam  Nursing note and vitals reviewed. Constitutional: She is oriented to person, place, and time. She appears well-developed and well-nourished. No  distress.  HENT:  Head: Normocephalic and atraumatic.  Eyes: Conjunctivae and EOM are normal. Pupils are equal, round, and reactive to light. No scleral icterus.  Neck: Neck supple. No JVD present. No thyromegaly present.  Cardiovascular: Regular rhythm and normal heart sounds.        Mild tachycardia No LEE  Pulmonary/Chest: Breath sounds normal.  Neurological: She is alert and oriented to person, place, and time.  Skin: No rash noted.    ED Course  Procedures (including critical care time)  Labs Reviewed  POCT I-STAT, CHEM 8 - Abnormal; Notable for the following:    Creatinine, Ser 1.20 (*)    Glucose, Bld 130 (*)    All other components within normal limits   No results found.   1. Tremors of nervous system   2. Creatinine elevation       MDM  Normal electrolytes, mild hyperglycemia with a double increase in creatinine 1.2 compared 0.6 on April 9. Patient unsure when was her last thyroid check. Polypharmacy. Decided to transfer to the emergency department for further evaluation and management. Unsure about reliable outpatient follow up for this patient. I called her PCP office left a message for her primary care provider never got a call back.         Sharin Grave, MD 10/24/11 [phone]

## 2011-10-23 NOTE — ED Notes (Signed)
PT HERE WITH EVALUATION OF HAND TREMORS THAT HAS BEEN GOING ON SINCE 2 WEEKS AND WAS TOLD BY [facility] TO COME HERE TO DETERMINE IF ANY PRESCRIBED MEDS ARE CAUSING TREMORS BEFORE HE CAN REFILL XANAX??PT HAS HX FREQ PANIC ATTACKS AND MENTAL HEALTH ISSUES SEEN BY [facility] BUT STATES THEY NO LONGER PRESCRIBE XANAX.PT IS TAKING CLONAZEPAM AND STATES IT TAKES EDGE OFF BUT HAND TREMORS CONTINUES.

## 2011-10-23 NOTE — Discharge Instructions (Signed)
Make sure you continue to take your medications as prescribed. Drink plenty of fluids. Follow up with your doctor as soon as able for recheck of your renal function and medication adjustment. Return if worsening.   Anxiety and Panic Attacks Your caregiver has informed you that you are having an anxiety or panic attack. There may be many forms of this. Most of the time these attacks come suddenly and without warning. They come at any time of day, including periods of sleep, and at any time of life. They may be strong and unexplained. Although panic attacks are very scary, they are physically harmless. Sometimes the cause of your anxiety is not known. Anxiety is a protective mechanism of the body in its fight or flight mechanism. Most of these perceived danger situations are actually nonphysical situations (such as anxiety over losing a job). CAUSES  The causes of an anxiety or panic attack are many. Panic attacks may occur in otherwise healthy people given a certain set of circumstances. There may be a genetic cause for panic attacks. Some medications may also have anxiety as a side effect. SYMPTOMS  Some of the most common feelings are:  Intense terror.   Dizziness, feeling faint.   Hot and cold flashes.   Fear of going crazy.   Feelings that nothing is real.   Sweating.   Shaking.   Chest pain or a fast heartbeat (palpitations).   Smothering, choking sensations.   Feelings of impending doom and that death is near.   Tingling of extremities, this may be from over-breathing.   Altered reality (derealization).   Being detached from yourself (depersonalization).  Several symptoms can be present to make up anxiety or panic attacks. DIAGNOSIS  The evaluation by your caregiver will depend on the type of symptoms you are experiencing. The diagnosis of anxiety or panic attack is made when no physical illness can be determined to be a cause of the symptoms. TREATMENT  Treatment to  prevent anxiety and panic attacks may include:  Avoidance of circumstances that cause anxiety.   Reassurance and relaxation.   Regular exercise.   Relaxation therapies, such as yoga.   Psychotherapy with a psychiatrist or therapist.   Avoidance of caffeine, alcohol and illegal drugs.   Prescribed medication.  SEEK IMMEDIATE MEDICAL CARE IF:   You experience panic attack symptoms that are different than your usual symptoms.   You have any worsening or concerning symptoms.  Document Released: 06/09/2005 Document Revised: 05/29/2011 Document Reviewed: 10/11/2009 [facility] Patient Information 2012 [place], [place].

## 2011-10-23 NOTE — ED Provider Notes (Signed)
History     CSN: [id number]  Arrival date & time 10/23/11  1749   First MD Initiated Contact with Patient 10/23/11 2041      Chief Complaint  Patient presents with  . Tremors    (Consider location/radiation/quality/duration/timing/severity/associated sxs/prior treatment) Patient is a 46 y.o. female presenting with weakness. The history is provided by the patient.  Weakness Primary symptoms do not include headaches, loss of consciousness, seizures, dizziness, visual change, paresthesias, focal weakness, loss of sensation, speech change, fever, nausea or vomiting. The symptoms began 2 days ago.  Additional symptoms include weakness.  Pt states for the last several days she has had increased anxiety and tremors. She has been taking her friends xanax in addition to all her medications which has helped. States [facility] where  She used to get them from, is no longer giving them to her. She was sent from her doctors to UC, and sent here for further evaluation. She was also found to have elevated creatinine which is why she was sent here. Pt denies fever, chills, nausea, vomiting, headache. States currently she is feeling good. Her daughter who is with pt, thinks that her symptoms are caused by a different dosages of her medications which were adjusted a week ago.   Past Medical History  Diagnosis Date  . Abdominal pain   . Diabetes mellitus     states her doctor took her off all DM meds in past month  . COPD (chronic obstructive pulmonary disease)   . GERD (gastroesophageal reflux disease)   . Chronic nausea   . Hypothyroidism   . Mental disorder   . Depression     bipolar and schizophrenic  . Headache   . Anxiety   . Atrial fibrillation 09/29/11    Past Surgical History  Procedure Date  . Laparoscopic assisted vaginal hysterectomy 03/04/2011    Procedure: LAPAROSCOPIC ASSISTED VAGINAL HYSTERECTOMY;  Surgeon: Jeani Hawking, MD;  Location: [facility];  Service: Gynecology;  Laterality:  N/A;  . Cystocele repair 03/04/2011    Procedure: ANTERIOR REPAIR (CYSTOCELE);  Surgeon: Martina Sinner;  Location: [facility];  Service: Urology;  Laterality: N/A;  . Bladder suspension 03/04/2011    Procedure: SPARC PROCEDURE;  Surgeon: Jonna Coup MacDiarmid;  Location: [facility];  Service: Urology;  Laterality: N/A;  . Cystoscopy 03/04/2011    Procedure: CYSTOSCOPY;  Surgeon: Jonna Coup MacDiarmid;  Location: [facility];  Service: Urology;  Laterality: N/A;    Family History  Problem Relation Age of Onset  . Heart attack Father     39s  . Heart attack Sister     60    History  Substance Use Topics  . Smoking status: Current Everyday Smoker -- 3.0 packs/day    Types: Cigarettes  . Smokeless tobacco: Not on file  . Alcohol Use: No    OB History    Grav Para Term Preterm Abortions TAB SAB Ect Mult Living   3 3   0 0 0 0 0 3      Review of Systems  Constitutional: Negative for fever and chills.  Eyes: Negative.   Respiratory: Negative.   Cardiovascular: Negative.   Gastrointestinal: Negative.  Negative for nausea and vomiting.  Genitourinary: Negative.   Musculoskeletal: Negative.   Skin: Negative.   Neurological: Positive for tremors and weakness. Negative for dizziness, speech change, focal weakness, seizures, loss of consciousness, headaches and paresthesias.  Psychiatric/Behavioral: Negative.     Allergies  Aspirin  Home Medications   Current Outpatient  Rx  Name Route Sig Dispense Refill  . ALBUTEROL SULFATE HFA 108 (90 BASE) MCG/ACT IN AERS Inhalation Inhale 2 puffs into the lungs every 6 (six) hours as needed. For wheezing 1 Inhaler 11  . ASPIRIN 81 MG PO CHEW Oral Chew 1 tablet (81 mg total) by mouth daily. 30 tablet 0  . BUDESONIDE-FORMOTEROL FUMARATE 160-4.5 MCG/ACT IN AERO Inhalation Inhale 2 puffs into the lungs 2 (two) times daily. 1 Inhaler 11  . BUTALBITAL-APAP-CAFFEINE 50-325-40 MG PO TABS Oral Take 1 tablet by mouth every 6 (six) hours as needed for pain or  headache. For headache 14 tablet 1  . CLONAZEPAM 0.5 MG PO TABS Oral Take 3 tablets (1.5 mg total) by mouth at bedtime as needed. For anxiety 30 tablet 0  . CYCLOBENZAPRINE HCL 10 MG PO TABS Oral Take 1 tablet (10 mg total) by mouth at bedtime as needed. For sleep 30 tablet 0  . DICLOFENAC SODIUM 75 MG PO TBEC Oral Take 1 tablet (75 mg total) by mouth 2 (two) times daily. 15 tablet 0  . DILTIAZEM HCL ER COATED BEADS 240 MG PO CP24 Oral Take 1 capsule (240 mg total) by mouth daily. 30 capsule 3  . DULOXETINE HCL 30 MG PO CPEP Oral Take 1-2 capsules (30-60 mg total) by mouth 2 (two) times daily. 2 caps in am, 1 in pm 30 capsule 0  . ECONAZOLE NITRATE 1 % EX CREA Topical Apply 1 application topically 2 (two) times daily. To feet    . GABAPENTIN 300 MG PO CAPS Oral Take 1 capsule (300 mg total) by mouth 3 (three) times daily. 90 capsule 0  . HYDROXYZINE HCL 25 MG PO TABS Oral Take 1 tablet (25 mg total) by mouth 2 (two) times daily. 30 tablet 0  . LEVOTHYROXINE SODIUM 50 MCG PO TABS Oral Take 1 tablet (50 mcg total) by mouth daily. 30 tablet 0  . LISINOPRIL-HYDROCHLOROTHIAZIDE 10-12.5 MG PO TABS Oral Take 1 tablet by mouth every morning. 30 tablet 0  . LURASIDONE HCL 40 MG PO TABS Oral Take 1 tablet (40 mg total) by mouth daily with breakfast. 30 tablet 0  . METAXALONE 800 MG PO TABS Oral Take 0.5-1 tablets (400-800 mg total) by mouth 4 (four) times daily as needed for pain. For Muscle spasms 15 tablet 0  . MIRABEGRON ER 25 MG PO TB24 Oral Take 1 tablet (25 mg total) by mouth daily. 30 tablet 0  . ONDANSETRON HCL 4 MG PO TABS Oral Take 4 mg by mouth every 8 (eight) hours as needed. For nausea    . OXYCODONE-ACETAMINOPHEN 10-325 MG PO TABS Oral Take 1 tablet by mouth 3 (three) times daily as needed. For pain. 30 tablet 0  . OXYMORPHONE HCL ER 40 MG PO TB12 Oral Take 1 tablet (40 mg total) by mouth every 8 (eight) hours. For back pain 30 tablet 0  . PROMETHAZINE HCL 25 MG PO TABS Oral Take 25 mg by mouth  every 6 (six) hours as needed. For nausea.     Marland Kitchen SIMVASTATIN 20 MG PO TABS Oral Take 1 tablet (20 mg total) by mouth daily. 30 tablet 0  . SUMATRIPTAN SUCCINATE 6 MG/0.5ML [place] SOLN Subcutaneous Inject 6 mg into the skin once as needed. If needed for headache      . TIOTROPIUM BROMIDE MONOHYDRATE 18 MCG IN CAPS Inhalation Place 1 capsule (18 mcg total) into inhaler and inhale daily. 30 capsule 11  . TRAZODONE HCL 100 MG PO TABS Oral Take  2 tablets (200 mg total) by mouth at bedtime. 30 tablet 0    BP 105/63  Pulse 85  Temp(Src) 98.3 F (36.8 C) (Oral)  Resp 20  SpO2 100%  LMP 01/08/2011  Physical Exam  Nursing note and vitals reviewed. Constitutional: She is oriented to person, place, and time. She appears well-developed and well-nourished.       Pt appears very sedated, sleepy  HENT:  Head: Normocephalic and atraumatic.  Eyes: Conjunctivae and EOM are normal. Pupils are equal, round, and reactive to light.  Neck: Normal range of motion. Neck supple.  Cardiovascular: Normal rate and normal heart sounds.   Pulmonary/Chest: Effort normal and breath sounds normal. No respiratory distress.  Abdominal: Soft. Bowel sounds are normal. She exhibits no distension. There is no tenderness.  Musculoskeletal: Normal range of motion.  Neurological: She is alert and oriented to person, place, and time. She has normal reflexes. No cranial nerve deficit.  Skin: Skin is warm and dry.  Psychiatric: She has a normal mood and affect.    ED Course  Procedures (including critical care time)  Pt with increased anxiety, tremors, which are not currently present on exam. Pt did have her medication dosages changed about a week ago. She is also no longer taking xanax that she used to be on. I suspect this is the cause of her anxiety. She has no neuro deficits on exam. Her creatinine is elevated to 1.2 and will need to be closely followed by her doctor. She will be d/c home with close follow up. 1. Anxiety        MDM          Lottie Mussel, PA 10/24/11 0600

## 2011-10-23 NOTE — ED Notes (Signed)
Pt ambulated to NF with steady gait.  Pt requesting xanax while in w/r for anxiety.  Apologized for delays.  Pt to restroom to give urine sample at this time.

## 2011-10-25 NOTE — ED Provider Notes (Signed)
Medical screening examination/treatment/procedure(s) were performed by non-physician practitioner and as supervising physician I was immediately available for consultation/collaboration.   Gwyneth Sprout, MD 10/25/11 [phone]

## 2011-10-29 ENCOUNTER — Telehealth: Payer: Self-pay

## 2011-10-29 NOTE — Telephone Encounter (Signed)
Received message patient was in hospital early 4/13, patient called 10/23/11 to schedule appointment.Patient called she stated she had to go back to ER recently.Appointment scheduled with Norma Fredrickson NP 11/04/11.

## 2011-11-04 ENCOUNTER — Ambulatory Visit (INDEPENDENT_AMBULATORY_CARE_PROVIDER_SITE_OTHER): Payer: PRIVATE HEALTH INSURANCE | Admitting: Nurse Practitioner

## 2011-11-04 ENCOUNTER — Encounter: Payer: Self-pay | Admitting: Nurse Practitioner

## 2011-11-04 VITALS — BP 114/68 | HR 90 | Ht 61.0 in | Wt 192.0 lb

## 2011-11-04 DIAGNOSIS — J449 Chronic obstructive pulmonary disease, unspecified: Secondary | ICD-10-CM

## 2011-11-04 DIAGNOSIS — T50901A Poisoning by unspecified drugs, medicaments and biological substances, accidental (unintentional), initial encounter: Secondary | ICD-10-CM

## 2011-11-04 DIAGNOSIS — E039 Hypothyroidism, unspecified: Secondary | ICD-10-CM

## 2011-11-04 DIAGNOSIS — I10 Essential (primary) hypertension: Secondary | ICD-10-CM

## 2011-11-04 DIAGNOSIS — I4891 Unspecified atrial fibrillation: Secondary | ICD-10-CM

## 2011-11-04 DIAGNOSIS — R002 Palpitations: Secondary | ICD-10-CM

## 2011-11-04 LAB — BASIC METABOLIC PANEL
BUN: 21 mg/dL (ref 6–23)
CO2: 26 mEq/L (ref 19–32)
Calcium: 8.5 mg/dL (ref 8.4–10.5)
Chloride: 105 mEq/L (ref 96–112)
Creatinine, Ser: 0.9 mg/dL (ref 0.4–1.2)
GFR: 87.26 mL/min (ref >60.00)
Glucose, Bld: 132 mg/dL — ABNORMAL HIGH (ref 70–99)
Potassium: 3.7 mEq/L (ref 3.5–5.1)
Sodium: 139 mEq/L (ref 135–145)

## 2011-11-04 LAB — TSH: TSH: 1.71 u[IU]/mL (ref 0.35–5.50)

## 2011-11-04 NOTE — Assessment & Plan Note (Signed)
On multiple inhalers and nebulizers. Smoking cessation is greatly encouraged.

## 2011-11-04 NOTE — Patient Instructions (Signed)
We are going to put a monitor on for the next 24 hours. This is to see what your heart rhythm is doing.  We are going to recheck some labs today.  Stay on your current medicines.  Dr. Swaziland and I will see you back in about 1 month.  Continue seeing Dr. Concepcion Elk  Call the [facility] office at [phone] if you have any questions, problems or concerns.

## 2011-11-04 NOTE — Assessment & Plan Note (Signed)
Blood pressure is satisfactory. She is off of her Lisinopril Hct.

## 2011-11-04 NOTE — Progress Notes (Signed)
[name] Date of Birth: 24-Jul-1965 Medical Record #[id number]  History of Present Illness: [name] is seen back today for a post hospital visit. She is seen for Dr. Swaziland. She is a 46 year old female with multiple medical issues that include HTN, DM, COPD, ongoing tobacco abuse, bipolar disorder with schizophrenia, chronic pain and hypothyroidism. She was admitted last month with an accidental drug overdose. She is on multiple pain meds, psyche meds, etc. She did have PAF while hospitalized but converted spontaneously. She is now on Diltiazem and is off of her Lisinopril HCT.   She comes in today. She is here with her husband. She is complaining of palpitations. Says she can't exercise. Says she was running and using the elipitical prior to her hospitalization. Says her heart is just "flying away". She is short of breath. Still smoking. Her husband thinks she is still too sedated. Not very clear what she took today. Sounds like she manages her own medicines. She is in the pain clinic. TSH was low while she in the hospital and we will recheck today.   Current Outpatient Prescriptions on File Prior to Visit  Medication Sig Dispense Refill  . albuterol (PROVENTIL HFA;VENTOLIN HFA) 108 (90 BASE) MCG/ACT inhaler Inhale 2 puffs into the lungs every 6 (six) hours as needed. For wheezing      . aspirin 81 MG chewable tablet Chew 81 mg by mouth daily.      . budesonide-formoterol (SYMBICORT) 160-4.5 MCG/ACT inhaler Inhale 2 puffs into the lungs 2 (two) times daily.      . clonazePAM (KLONOPIN) 0.5 MG tablet Take 1.5 mg by mouth at bedtime as needed. For anxiety      . diltiazem (CARDIZEM CD) 240 MG 24 hr capsule Take 240 mg by mouth daily.      . DULoxetine (CYMBALTA) 30 MG capsule Take 30-60 mg by mouth 2 (two) times daily. 2 caps in am, 1 in pm      . econazole nitrate 1 % cream Apply 1 application topically 2 (two) times daily. To feet      . gabapentin (NEURONTIN) 300 MG capsule Take 300 mg  by mouth 3 (three) times daily.      . hydrOXYzine (ATARAX/VISTARIL) 25 MG tablet Take 1 tablet (25 mg total) by mouth 2 (two) times daily.  30 tablet  0  . levothyroxine (SYNTHROID, LEVOTHROID) 50 MCG tablet Take 50 mcg by mouth daily.      Marland Kitchen lurasidone (LATUDA) 40 MG TABS Take 40 mg by mouth daily with breakfast.      . metaxalone (SKELAXIN) 800 MG tablet Take 400-800 mg by mouth 4 (four) times daily as needed. For Muscle spasms      . Mirabegron ER 25 MG TB24 Take 1 tablet by mouth daily.      Marland Kitchen oxyCODONE-acetaminophen (PERCOCET) 10-325 MG per tablet Take 1 tablet by mouth 3 (three) times daily as needed. For pain.      Marland Kitchen oxymorphone (OPANA ER) 40 MG 12 hr tablet Take 40 mg by mouth every 8 (eight) hours. For back pain      . promethazine (PHENERGAN) 25 MG tablet Take 25 mg by mouth every 6 (six) hours as needed. For nausea.       . simvastatin (ZOCOR) 20 MG tablet Take 20 mg by mouth daily.      Marland Kitchen tiotropium (SPIRIVA) 18 MCG inhalation capsule Place 1 capsule (18 mcg total) into inhaler and inhale daily.  30 capsule  11  . traZODone (DESYREL) 100 MG tablet Take 200 mg by mouth at bedtime.        Allergies  Allergen Reactions  . Aspirin Nausea And Vomiting    Does fine with ibuprofen    Past Medical History  Diagnosis Date  . Abdominal pain   . Diabetes mellitus     states her doctor took her off all DM meds in past month  . COPD (chronic obstructive pulmonary disease)   . GERD (gastroesophageal reflux disease)   . Chronic nausea   . Hypothyroidism   . Mental disorder     Bipolar and schizophrenic  . Depression   . Headache   . Anxiety   . Atrial fibrillation 09/29/11    converted spontaneously  . Chronic pain   . Tobacco abuse   . HTN (hypertension)   . Accidental drug overdose April 2013    Past Surgical History  Procedure Date  . Laparoscopic assisted vaginal hysterectomy 03/04/2011    Procedure: LAPAROSCOPIC ASSISTED VAGINAL HYSTERECTOMY;  Surgeon: Jeani Hawking,  MD;  Location: [facility];  Service: Gynecology;  Laterality: N/A;  . Cystocele repair 03/04/2011    Procedure: ANTERIOR REPAIR (CYSTOCELE);  Surgeon: Martina Sinner;  Location: [facility];  Service: Urology;  Laterality: N/A;  . Bladder suspension 03/04/2011    Procedure: SPARC PROCEDURE;  Surgeon: Jonna Coup MacDiarmid;  Location: [facility];  Service: Urology;  Laterality: N/A;  . Cystoscopy 03/04/2011    Procedure: CYSTOSCOPY;  Surgeon: Jonna Coup MacDiarmid;  Location: [facility];  Service: Urology;  Laterality: N/A;    History  Smoking status  . Current Everyday Smoker -- 3.0 packs/day  . Types: Cigarettes  Smokeless tobacco  . Not on file    History  Alcohol Use No    Family History  Problem Relation Age of Onset  . Heart attack Father     50s  . Heart attack Sister     89    Review of Systems: The review of systems is positive for palpitations.  All other systems were reviewed and are negative.  Physical Exam: BP 114/68  Pulse 90  Ht 5\' 1"  (1.549 m)  Wt 192 lb (87.091 kg)  BMI 36.28 kg/m2  LMP 01/08/2011 Patient is very pleasant and in no acute distress. Initially seemed a little sedated with some slurring of her words, but she is aware and responds appropriately. Smells of tobacco. Skin is warm and dry. Color is normal.  HEENT is unremarkable. Normocephalic/atraumatic. PERRL. Sclera are nonicteric. Neck is supple. No masses. No JVD. Lungs are coarse with diffuse wheezing.  Cardiac exam shows a regular rate and rhythm today. Abdomen is soft. Extremities are without edema. Gait and ROM are intact. No gross neurologic deficits noted.  LABORATORY DATA:  Her EKG today shows sinus rhythm.   Lab Results  Component Value Date   WBC 9.1 09/30/2011   HGB 13.6 10/23/2011   HCT 40.0 10/23/2011   PLT 171 09/30/2011   GLUCOSE 130* 10/23/2011   ALT 16 09/28/2011   AST 14 09/28/2011   NA 140 10/23/2011   K 4.0 10/23/2011   CL 104 10/23/2011   CREATININE 1.20* 10/23/2011   BUN 22 10/23/2011   CO2 27 09/30/2011     TSH 0.242* 09/30/2011   INR 0.91 09/28/2011    Echo Study Conclusions (April 2013)  Left ventricle: The cavity size was normal. Systolic function was normal. The estimated ejection fraction was in the range of 55% to 60%.  Wall motion was normal; there were no regional wall motion abnormalities.    Assessment / Plan:

## 2011-11-04 NOTE — Assessment & Plan Note (Signed)
She is on multiple medicines which cause sedation/slurring of words. She is to continue with her psyche and pain clinic follow up.

## 2011-11-04 NOTE — Assessment & Plan Note (Signed)
She remains in sinus. She is complaining of continuing to have palpitations. I have placed a 24 hour Holter. No change in her medicines for now. We will recheck her TSH. Her thyroid medicine may need adjustment. I think the crux of her issues centers around endorsing general good health measures. She has an appointment in June with Dr. Sw[place]iland. Will continue with the Diltiazem. Patient is agreeable to this plan and will call if any problems develop in the interim.

## 2011-11-13 ENCOUNTER — Telehealth: Payer: Self-pay

## 2011-11-13 NOTE — Telephone Encounter (Signed)
Patient called to be given monitor results no answer.LMTC.

## 2011-11-18 ENCOUNTER — Telehealth: Payer: Self-pay

## 2011-11-18 NOTE — Telephone Encounter (Signed)
Patient called left message on personal voice mail,monitor ok no atrial fib,occassional pac's and rare pvc's.Advised to keep appointment with Dr.Jordan 11/24/11.

## 2011-11-20 ENCOUNTER — Encounter: Payer: PRIVATE HEALTH INSURANCE | Admitting: Cardiology

## 2011-11-24 ENCOUNTER — Ambulatory Visit (INDEPENDENT_AMBULATORY_CARE_PROVIDER_SITE_OTHER): Payer: PRIVATE HEALTH INSURANCE | Admitting: Cardiology

## 2011-11-24 ENCOUNTER — Encounter: Payer: Self-pay | Admitting: Cardiology

## 2011-11-24 VITALS — BP 122/73 | HR 106 | Ht 61.0 in | Wt 191.2 lb

## 2011-11-24 DIAGNOSIS — I491 Atrial premature depolarization: Secondary | ICD-10-CM

## 2011-11-24 DIAGNOSIS — I4949 Other premature depolarization: Secondary | ICD-10-CM

## 2011-11-24 DIAGNOSIS — I493 Ventricular premature depolarization: Secondary | ICD-10-CM

## 2011-11-24 DIAGNOSIS — I4891 Unspecified atrial fibrillation: Secondary | ICD-10-CM

## 2011-11-24 NOTE — Patient Instructions (Signed)
Stop ASA.   Stop smoking and avoid drinking caffeine.  I will see you again as needed.

## 2011-11-24 NOTE — Progress Notes (Signed)
[name] Date of Birth: Nov 29, 1965 Medical Record #[id number]  History of Present Illness: [name] is seen back today for a followup visit. She had had an episode of atrial fibrillation when hospitalized with an accidental drug overdose. She converted to sinus rhythm. Her blood pressure medication was switched from lisinopril HCT to diltiazem. She continued to complain of her heart racing several times a day typically for 5-6 minutes. This usually occurs when she is upset. She complains that even a baby aspirin causes her stomach to be upset to the point that she has to take medicine for nausea.  Current Outpatient Prescriptions on File Prior to Visit  Medication Sig Dispense Refill  . albuterol (PROVENTIL HFA;VENTOLIN HFA) 108 (90 BASE) MCG/ACT inhaler Inhale 2 puffs into the lungs every 6 (six) hours as needed. For wheezing      . aspirin 81 MG chewable tablet Chew 81 mg by mouth daily.      . budesonide-formoterol (SYMBICORT) 160-4.5 MCG/ACT inhaler Inhale 2 puffs into the lungs 2 (two) times daily.      . clonazePAM (KLONOPIN) 0.5 MG tablet Take 1.5 mg by mouth daily. For anxiety      . diltiazem (CARDIZEM CD) 240 MG 24 hr capsule Take 240 mg by mouth daily.      . DULoxetine (CYMBALTA) 30 MG capsule Take 30-60 mg by mouth 2 (two) times daily. 2 caps in am, 1 in pm      . econazole nitrate 1 % cream Apply 1 application topically 2 (two) times daily. To feet      . gabapentin (NEURONTIN) 300 MG capsule Take 300 mg by mouth 3 (three) times daily.      . hydrOXYzine (ATARAX/VISTARIL) 25 MG tablet Take 1 tablet (25 mg total) by mouth 2 (two) times daily.  30 tablet  0  . levothyroxine (SYNTHROID, LEVOTHROID) 50 MCG tablet Take 50 mcg by mouth daily.      Marland Kitchen lurasidone (LATUDA) 40 MG TABS Take 40 mg by mouth daily with breakfast.      . metaxalone (SKELAXIN) 800 MG tablet Take 400-800 mg by mouth 4 (four) times daily as needed. For Muscle spasms      . Mirabegron ER 25 MG TB24 Take 1  tablet by mouth daily.      Marland Kitchen oxyCODONE-acetaminophen (PERCOCET) 10-325 MG per tablet Take 1 tablet by mouth 3 (three) times daily as needed. For pain.      Marland Kitchen oxymorphone (OPANA ER) 40 MG 12 hr tablet Take 40 mg by mouth every 8 (eight) hours. For back pain      . promethazine (PHENERGAN) 25 MG tablet Take 25 mg by mouth every 6 (six) hours as needed. For nausea.       . simvastatin (ZOCOR) 20 MG tablet Take 20 mg by mouth daily.      Marland Kitchen tiotropium (SPIRIVA) 18 MCG inhalation capsule Place 1 capsule (18 mcg total) into inhaler and inhale daily.  30 capsule  11  . traZODone (DESYREL) 100 MG tablet Take 200 mg by mouth at bedtime.        Allergies  Allergen Reactions  . Aspirin Nausea And Vomiting    Does fine with ibuprofen    Past Medical History  Diagnosis Date  . Abdominal pain   . Diabetes mellitus     states her doctor took her off all DM meds in past month  . COPD (chronic obstructive pulmonary disease)   . GERD (gastroesophageal reflux disease)   .  Chronic nausea   . Hypothyroidism   . Mental disorder     Bipolar and schizophrenic  . Depression   . Headache   . Anxiety   . Atrial fibrillation 09/29/11    converted spontaneously  . Chronic pain   . Tobacco abuse   . HTN (hypertension)   . Accidental drug overdose April 2013    Past Surgical History  Procedure Date  . Laparoscopic assisted vaginal hysterectomy 03/04/2011    Procedure: LAPAROSCOPIC ASSISTED VAGINAL HYSTERECTOMY;  Surgeon: Jeani Hawking, MD;  Location: [facility];  Service: Gynecology;  Laterality: N/A;  . Cystocele repair 03/04/2011    Procedure: ANTERIOR REPAIR (CYSTOCELE);  Surgeon: Martina Sinner;  Location: [facility];  Service: Urology;  Laterality: N/A;  . Bladder suspension 03/04/2011    Procedure: SPARC PROCEDURE;  Surgeon: Jonna Coup MacDiarmid;  Location: [facility];  Service: Urology;  Laterality: N/A;  . Cystoscopy 03/04/2011    Procedure: CYSTOSCOPY;  Surgeon: Jonna Coup MacDiarmid;  Location: [facility];   Service: Urology;  Laterality: N/A;    History  Smoking status  . Current Everyday Smoker -- 3.0 packs/day  . Types: Cigarettes  Smokeless tobacco  . Not on file    History  Alcohol Use No    Family History  Problem Relation Age of Onset  . Heart attack Father     69s  . Heart attack Sister     71    Review of Systems: The review of systems is positive for palpitations.  All other systems were reviewed and are negative.  Physical Exam: BP 122/73  Pulse 106  Ht 5\' 1"  (1.549 m)  Wt 191 lb 3.2 oz (86.728 kg)  BMI 36.13 kg/m2  LMP 01/08/2011 Patient is very pleasant and in no acute distress. Skin is warm and dry. Color is normal.  HEENT is unremarkable. Normocephalic/atraumatic. PERRL. Sclera are nonicteric. Neck is supple. No masses. No JVD. Lungs are coarse with diffuse wheezing.  Cardiac exam shows a regular rate and rhythm today. Abdomen is soft. Extremities are without edema. Gait and ROM are intact. No gross neurologic deficits noted.  LABORATORY DATA:  A 24-hour Holter monitor was obtained on 11/04/2011. This demonstrated normal sinus rhythm. She had occasional PVCs and PACs but no sustained tachyarrhythmia.  Lab Results  Component Value Date   WBC 9.1 09/30/2011   HGB 13.6 10/23/2011   HCT 40.0 10/23/2011   PLT 171 09/30/2011   GLUCOSE 132* 11/04/2011   ALT 16 09/28/2011   AST 14 09/28/2011   NA 139 11/04/2011   K 3.7 11/04/2011   CL 105 11/04/2011   CREATININE 0.9 11/04/2011   BUN 21 11/04/2011   CO2 26 11/04/2011   TSH 1.71 11/04/2011   INR 0.91 09/28/2011    Echo Study Conclusions (April 2013)  Left ventricle: The cavity size was normal. Systolic function was normal. The estimated ejection fraction was in the range of 55% to 60%. Wall motion was normal; there were no regional wall motion abnormalities.    Assessment / Plan:

## 2011-11-24 NOTE — Assessment & Plan Note (Addendum)
She has no evidence of recurrent atrial fibrillation. Her Holter monitor was unremarkable except for isolated PACs and PVCs. I explained the importance of avoidance of cardiac stimulants such as caffeine, decongestants, and tobacco. At this point she requires no further treatment or evaluation. I will see her back as needed. I have recommended that she stop taking aspirin because of her GI intolerance.

## 2012-07-13 ENCOUNTER — Other Ambulatory Visit: Payer: Self-pay | Admitting: Internal Medicine

## 2012-07-13 DIAGNOSIS — Z1231 Encounter for screening mammogram for malignant neoplasm of breast: Secondary | ICD-10-CM

## 2012-08-11 ENCOUNTER — Ambulatory Visit
Admission: RE | Admit: 2012-08-11 | Discharge: 2012-08-11 | Disposition: A | Discharge status: Home / Self Care | Payer: PRIVATE HEALTH INSURANCE | Source: Ambulatory Visit | Attending: Internal Medicine | Admitting: Internal Medicine

## 2012-08-11 DIAGNOSIS — Z1231 Encounter for screening mammogram for malignant neoplasm of breast: Secondary | ICD-10-CM

## 2012-08-12 ENCOUNTER — Ambulatory Visit: Payer: PRIVATE HEALTH INSURANCE

## 2012-08-13 ENCOUNTER — Ambulatory Visit: Payer: PRIVATE HEALTH INSURANCE

## 2012-11-27 DIAGNOSIS — G8929 Other chronic pain: Secondary | ICD-10-CM | POA: Insufficient documentation

## 2012-11-27 DIAGNOSIS — M461 Sacroiliitis, not elsewhere classified: Secondary | ICD-10-CM | POA: Insufficient documentation

## 2012-11-27 DIAGNOSIS — M25569 Pain in unspecified knee: Secondary | ICD-10-CM | POA: Insufficient documentation

## 2012-11-27 DIAGNOSIS — E039 Hypothyroidism, unspecified: Secondary | ICD-10-CM | POA: Insufficient documentation

## 2012-12-03 ENCOUNTER — Emergency Department (INDEPENDENT_AMBULATORY_CARE_PROVIDER_SITE_OTHER): Payer: PRIVATE HEALTH INSURANCE

## 2012-12-03 ENCOUNTER — Encounter (HOSPITAL_COMMUNITY): Payer: Self-pay

## 2012-12-03 ENCOUNTER — Emergency Department (INDEPENDENT_AMBULATORY_CARE_PROVIDER_SITE_OTHER)
Admission: EM | Admit: 2012-12-03 | Discharge: 2012-12-03 | Disposition: A | Discharge status: Home / Self Care | Payer: PRIVATE HEALTH INSURANCE | Source: Home / Self Care

## 2012-12-03 DIAGNOSIS — J45909 Unspecified asthma, uncomplicated: Secondary | ICD-10-CM

## 2012-12-03 DIAGNOSIS — T148 Other injury of unspecified body region: Secondary | ICD-10-CM

## 2012-12-03 DIAGNOSIS — E119 Type 2 diabetes mellitus without complications: Secondary | ICD-10-CM

## 2012-12-03 DIAGNOSIS — W57XXXA Bitten or stung by nonvenomous insect and other nonvenomous arthropods, initial encounter: Secondary | ICD-10-CM

## 2012-12-03 DIAGNOSIS — J4520 Mild intermittent asthma, uncomplicated: Secondary | ICD-10-CM

## 2012-12-03 MED ORDER — LORATADINE 10 MG PO TABS: 10.0000 mg | ORAL_TABLET | Freq: Every day | ORAL | Status: DC

## 2012-12-03 MED ORDER — GLUCOSE BLOOD VI STRP: ORAL_STRIP | Status: DC

## 2012-12-03 MED ORDER — INSULIN NPH ISOPHANE & REGULAR (70-30) 100 UNIT/ML ~~LOC~~ SUSP: SUBCUTANEOUS | Status: DC

## 2012-12-03 NOTE — ED Notes (Signed)
History of DM, had reportedly been taken off of her DM medication some time ago, but had to start insulin earlier this week. Ran out of her test strips mid week, and has not used her insulin as she was afraid to use it not knowing what her CBG is. Has also developed a scattered itchy rash, and is concerned it may be caused by the insulin. Feels as if her glucose is elevated, since she is experiencing visual disturbances. Anxious

## 2012-12-03 NOTE — ED Provider Notes (Signed)
History     CSN: [id number]  Arrival date & time 12/03/12  1003   First MD Initiated Contact with Patient 12/03/12 1037      Chief Complaint  Patient presents with  . Diabetes    (Consider location/radiation/quality/duration/timing/severity/associated sxs/prior treatment) HPI Comments: Pt restarted on her bid insulin day before yesterday.  Pt has been out of test strips and her last check yesterday was 400.  No dizzienss,  No cardiac or neuro sx.  Has h/o asthma but no sob or tigtness.  Got bug bites last nigth that itch.  They are all over her body.  Patient is a 47 y.o. female presenting with diabetes problem. The history is provided by the patient.  Diabetes This is a recurrent problem. Pertinent negatives include no chest pain, no abdominal pain, no headaches and no shortness of breath.    Past Medical History  Diagnosis Date  . Abdominal pain   . Diabetes mellitus     states her doctor took her off all DM meds in past month  . COPD (chronic obstructive pulmonary disease)   . GERD (gastroesophageal reflux disease)   . Chronic nausea   . Hypothyroidism   . Mental disorder     Bipolar and schizophrenic  . Depression   . Headache(784.0)   . Anxiety   . Atrial fibrillation 09/29/11    converted spontaneously  . Chronic pain   . Tobacco abuse   . HTN (hypertension)   . Accidental drug overdose April 2013    Past Surgical History  Procedure Laterality Date  . Laparoscopic assisted vaginal hysterectomy  03/04/2011    Procedure: LAPAROSCOPIC ASSISTED VAGINAL HYSTERECTOMY;  Surgeon: Jeani Hawking, MD;  Location: [facility];  Service: Gynecology;  Laterality: N/A;  . Cystocele repair  03/04/2011    Procedure: ANTERIOR REPAIR (CYSTOCELE);  Surgeon: Martina Sinner;  Location: [facility];  Service: Urology;  Laterality: N/A;  . Bladder suspension  03/04/2011    Procedure: SPARC PROCEDURE;  Surgeon: Jonna Coup MacDiarmid;  Location: [facility];  Service: Urology;  Laterality: N/A;  .  Cystoscopy  03/04/2011    Procedure: CYSTOSCOPY;  Surgeon: Jonna Coup MacDiarmid;  Location: [facility];  Service: Urology;  Laterality: N/A;    Family History  Problem Relation Age of Onset  . Heart attack Father     43s  . Heart attack Sister     59    History  Substance Use Topics  . Smoking status: Current Every Day Smoker -- 3.00 packs/day    Types: Cigarettes  . Smokeless tobacco: Not on file  . Alcohol Use: No    OB History   Grav Para Term Preterm Abortions TAB SAB Ect Mult Living   3 3   0 0 0 0 0 3      Review of Systems  Respiratory: Negative for shortness of breath.   Cardiovascular: Negative for chest pain.  Gastrointestinal: Negative for abdominal pain.  Neurological: Negative for headaches.  All other systems reviewed and are negative.    Allergies  Aspirin  Home Medications   Current Outpatient Rx  Name  Route  Sig  Dispense  Refill  . ACETAMINOPHEN-BUTALBITAL 50-325 MG TABS   Oral   Take by mouth as needed.         Marland Kitchen albuterol (PROVENTIL HFA;VENTOLIN HFA) 108 (90 BASE) MCG/ACT inhaler   Inhalation   Inhale 2 puffs into the lungs every 6 (six) hours as needed. For wheezing         .  aspirin 81 MG chewable tablet   Oral   Chew 81 mg by mouth daily.         . clonazePAM (KLONOPIN) 0.5 MG tablet   Oral   Take 1.5 mg by mouth daily. For anxiety         . diltiazem (CARDIZEM CD) 240 MG 24 hr capsule   Oral   Take 240 mg by mouth daily.         . DULoxetine (CYMBALTA) 30 MG capsule   Oral   Take 30-60 mg by mouth 2 (two) times daily. 2 caps in am, 1 in pm         . gabapentin (NEURONTIN) 300 MG capsule   Oral   Take 300 mg by mouth 3 (three) times daily.         . insulin NPH-regular (NOVOLIN 70/30) (70-30) 100 UNIT/ML injection   Subcutaneous   Inject 10 Units into the skin daily with supper.         . lurasidone (LATUDA) 40 MG TABS   Oral   Take 40 mg by mouth daily with breakfast.         . oxyCODONE-acetaminophen  (PERCOCET) 10-325 MG per tablet   Oral   Take 1 tablet by mouth 3 (three) times daily as needed. For pain.         . simvastatin (ZOCOR) 20 MG tablet   Oral   Take 20 mg by mouth daily.         . traZODone (DESYREL) 100 MG tablet   Oral   Take 200 mg by mouth at bedtime.         . budesonide-formoterol (SYMBICORT) 160-4.5 MCG/ACT inhaler   Inhalation   Inhale 2 puffs into the lungs 2 (two) times daily.         Marland Kitchen buPROPion (WELLBUTRIN SR) 150 MG 12 hr tablet   Oral   Take 150 mg by mouth 2 (two) times daily.         . butalbital-acetaminophen-caffeine (FIORICET WITH CODEINE) 50-325-40-30 MG per capsule   Oral   Take 1 capsule by mouth every 4 (four) hours as needed for headache.         Marland Kitchen econazole nitrate 1 % cream   Topical   Apply 1 application topically 2 (two) times daily. To feet         . glimepiride (AMARYL) 4 MG tablet   Oral   Take 4 mg by mouth daily before breakfast.         . glucose blood (PRODIGY AUTOCODE TEST) test strip      Use as instructed   100 each   12   . hydrOXYzine (VISTARIL) 25 MG capsule   Oral   Take 25 mg by mouth 3 (three) times daily as needed for itching.         . insulin NPH-regular (NOVOLIN 70/30) (70-30) 100 UNIT/ML injection      10 units with breakfast   10 mL      . levothyroxine (SYNTHROID, LEVOTHROID) 50 MCG tablet   Oral   Take 50 mcg by mouth daily.         Marland Kitchen loratadine (CLARITIN) 10 MG tablet   Oral   Take 1 tablet (10 mg total) by mouth daily.         . metaxalone (SKELAXIN) 800 MG tablet   Oral   Take 400-800 mg by mouth 4 (four) times daily as needed. For Muscle spasms         .  metFORMIN (GLUCOPHAGE) 500 MG tablet   Oral   Take 500 mg by mouth 2 (two) times daily with a meal.         . Mirabegron ER 25 MG TB24   Oral   Take 1 tablet by mouth daily.         Marland Kitchen oxymorphone (OPANA ER) 40 MG 12 hr tablet   Oral   Take 40 mg by mouth every 8 (eight) hours. For back pain          . promethazine (PHENERGAN) 25 MG tablet   Oral   Take 25 mg by mouth every 6 (six) hours as needed. For nausea.          . SUMATRIPTAN SUCCINATE IJ   Injection   Inject 0.5 mLs as directed as needed.         . tiotropium (SPIRIVA) 18 MCG inhalation capsule   Inhalation   Place 1 capsule (18 mcg total) into inhaler and inhale daily.   30 capsule   11     BP 118/87  Pulse 85  Temp(Src) 97.9 F (36.6 C) (Oral)  Resp 23  SpO2 99%  LMP 01/08/2011  Physical Exam  Constitutional: She appears well-developed and well-nourished.  HENT:  Head: Normocephalic and atraumatic.  Eyes: Conjunctivae are normal. Pupils are equal, round, and reactive to light.  Neck: Normal range of motion. Neck supple.  Cardiovascular: Normal rate and normal heart sounds.   Pulmonary/Chest: Effort normal and breath sounds normal. No respiratory distress. She has no wheezes. She has no rales. She exhibits no tenderness.  Abdominal: Soft. Bowel sounds are normal.  Musculoskeletal: Normal range of motion. She exhibits no edema and no tenderness.  Neurological: She is alert.  Skin: Skin is warm and dry.  Psychiatric: She has a normal mood and affect.    ED Course  Procedures (including critical care time)  Labs Reviewed  GLUCOSE, CAPILLARY - Abnormal; Notable for the following:    Glucose-Capillary 230 (*)    All other components within normal limits   Dg Chest 2 View  12/03/2012   *RADIOLOGY REPORT*  Clinical Data: Diabetes  CHEST - 2 VIEW  Comparison:  09/29/2011  Findings:  The heart size and mediastinal contours are within normal limits.  Both lungs are clear.  The visualized skeletal structures are unremarkable.  IMPRESSION: No active cardiopulmonary disease.   Original Report Authenticated By: Janeece Riggers, M.D.     1. Diabetes   2. Asthma, mild intermittent, uncomplicated   3. Bug bite       MDM  Discussed importance of regular testing and refilled her correct test sttrips.   Discussed that her readings have dropped quickly in one day - from 400 to 200.  I recommended chanign her am insulin as above.  Asthma is stable.  Start claritin for her itchy mosquito bites.        Maryelizabeth Rowan, MD 12/03/12 1153

## 2012-12-14 DIAGNOSIS — M1710 Unilateral primary osteoarthritis, unspecified knee: Secondary | ICD-10-CM | POA: Insufficient documentation

## 2012-12-15 ENCOUNTER — Other Ambulatory Visit: Payer: Self-pay | Admitting: Internal Medicine

## 2012-12-15 DIAGNOSIS — K219 Gastro-esophageal reflux disease without esophagitis: Secondary | ICD-10-CM

## 2012-12-20 ENCOUNTER — Other Ambulatory Visit: Payer: PRIVATE HEALTH INSURANCE

## 2012-12-27 ENCOUNTER — Ambulatory Visit
Admission: RE | Admit: 2012-12-27 | Discharge: 2012-12-27 | Disposition: A | Discharge status: Home / Self Care | Payer: PRIVATE HEALTH INSURANCE | Source: Ambulatory Visit | Attending: Internal Medicine | Admitting: Internal Medicine

## 2012-12-27 DIAGNOSIS — K219 Gastro-esophageal reflux disease without esophagitis: Secondary | ICD-10-CM

## 2013-02-25 ENCOUNTER — Other Ambulatory Visit: Payer: Self-pay | Admitting: Internal Medicine

## 2013-04-04 ENCOUNTER — Encounter: Payer: Self-pay | Admitting: Obstetrics

## 2013-04-13 DIAGNOSIS — M47817 Spondylosis without myelopathy or radiculopathy, lumbosacral region: Secondary | ICD-10-CM | POA: Insufficient documentation

## 2013-04-19 ENCOUNTER — Ambulatory Visit (INDEPENDENT_AMBULATORY_CARE_PROVIDER_SITE_OTHER): Payer: PRIVATE HEALTH INSURANCE | Admitting: Obstetrics

## 2013-04-19 ENCOUNTER — Encounter: Payer: Self-pay | Admitting: Advanced Practice Midwife

## 2013-04-19 VITALS — BP 119/84 | HR 90 | Temp 98.7°F | Ht 62.0 in | Wt 177.0 lb

## 2013-04-19 DIAGNOSIS — A499 Bacterial infection, unspecified: Secondary | ICD-10-CM

## 2013-04-19 DIAGNOSIS — B9689 Other specified bacterial agents as the cause of diseases classified elsewhere: Secondary | ICD-10-CM | POA: Insufficient documentation

## 2013-04-19 DIAGNOSIS — N760 Acute vaginitis: Secondary | ICD-10-CM | POA: Insufficient documentation

## 2013-04-19 MED ORDER — METRONIDAZOLE 500 MG PO TABS: 500.0000 mg | ORAL_TABLET | Freq: Two times a day (BID) | ORAL | Status: DC

## 2013-04-19 NOTE — Progress Notes (Signed)
Subjective:     [name] is a 47 y.o. female here for a routine exam.  Current complaints: Patient states she still has her tubes and ovaries. Patient also states she has a discharge that smell and also c/o vaginal dryness during intercourse.  Personal health questionnaire reviewed: yes.   Gynecologic History Patient's last menstrual period was 01/08/2011. Contraception: status post hysterectomy Last Pap: not since before hysterectomy. Results were: normal [facility] Results were: normal  Obstetric History OB History  Gravida Para Term Preterm AB SAB TAB Ectopic Multiple Living  3 3   0 0 0 0 0 3    # Outcome Date GA Lbr Len/2nd Weight Sex Delivery Anes PTL Lv  3 PAR           2 PAR           1 PAR                The following portions of the patient's history were reviewed and updated as appropriate: allergies, current medications, past family history, past medical history, past social history, past surgical history and problem list.  Review of Systems Pertinent items are noted in HPI.    Objective:    General appearance: alert and no distress Breasts: normal appearance, no masses or tenderness Abdomen: normal findings: soft, non-tender Pelvic: adnexae not palpable, external genitalia normal, no adnexal masses or tenderness, uterus surgically absent and vagina normal without discharge    Assessment:    Healthy female exam.   BV   Plan:    Education reviewed: calcium supplements, low fat, low cholesterol diet, self breast exams and smoking cessation. F/U prn.   Flagyl Rx.

## 2013-04-20 LAB — WET PREP BY MOLECULAR PROBE: Gardnerella vaginalis: NEGATIVE

## 2013-04-27 IMAGING — CT CT HEAD W/O CM
1 series · 16 of 30 positions shown, 20 images · non-contrast
Comparison: 04/25/2005

CLINICAL DATA: Patient unresponsive.

CT HEAD WITHOUT CONTRAST
TECHNIQUE: Contiguous axial images were obtained from the base of
the skull through the vertex without contrast.

[Series 2: headseq 4.8 h45s · axial · 0.36mm/px · z∈[-220,-90]mm · 16 of 30 slices shown, 20 images]
[im 2/30  brain]
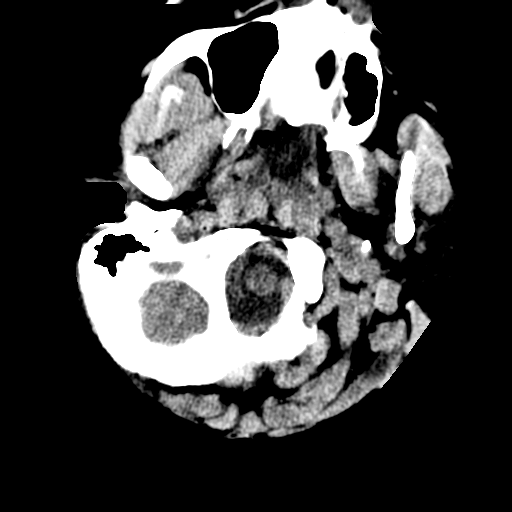
[im 2/30  bone]
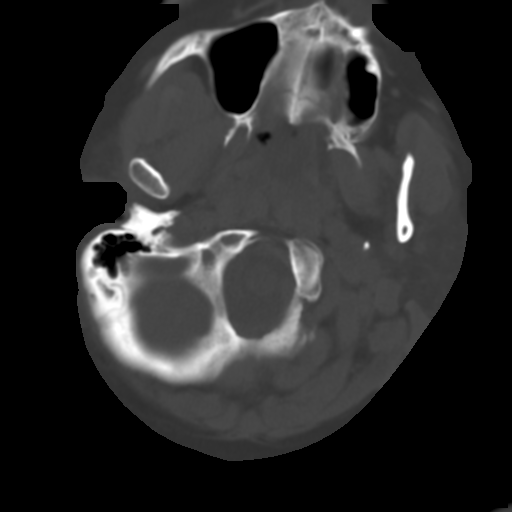
[im 4/30  brain]
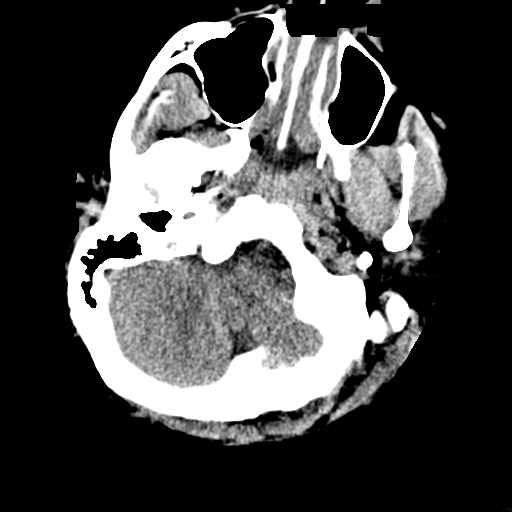
[im 6/30  brain]
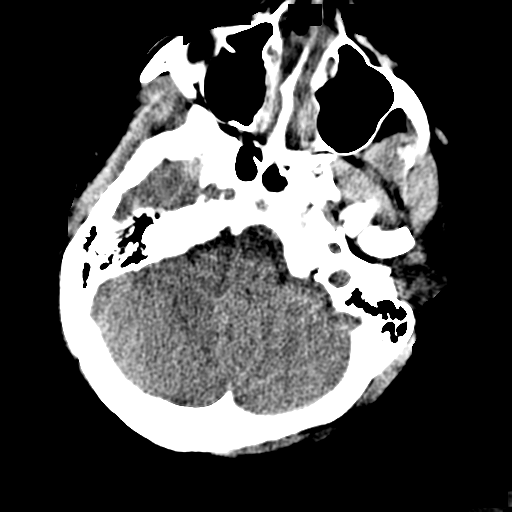
[im 8/30  brain]
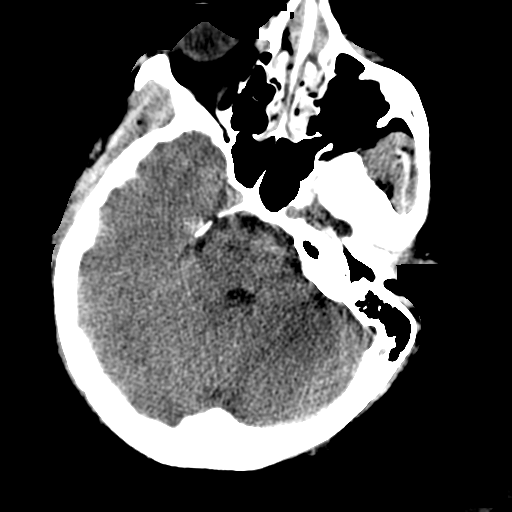
[im 9/30  brain]
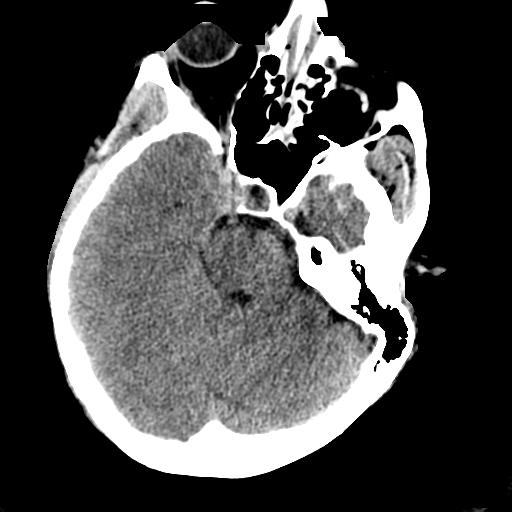
[im 9/30  bone]
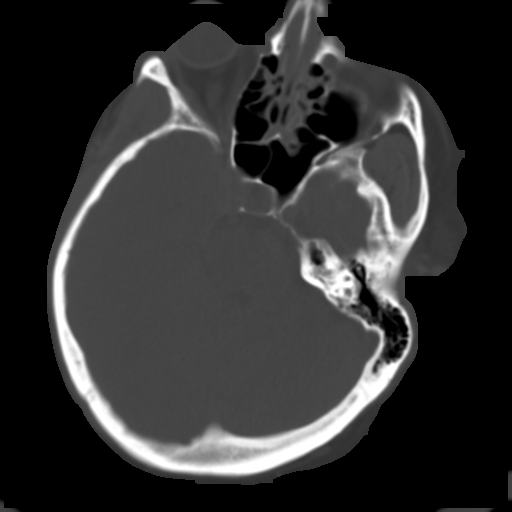
[im 11/30  brain]
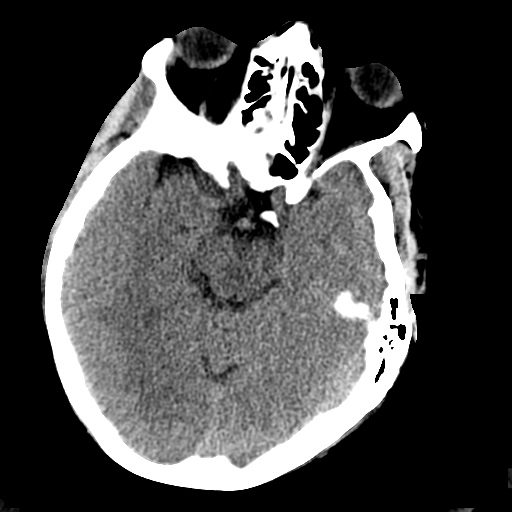
[im 13/30  brain]
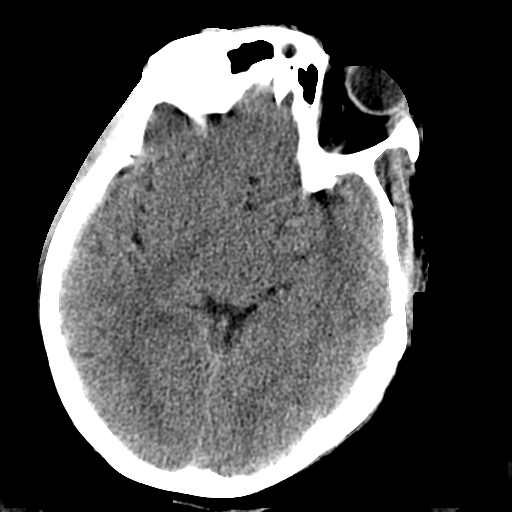
[im 15/30  brain]
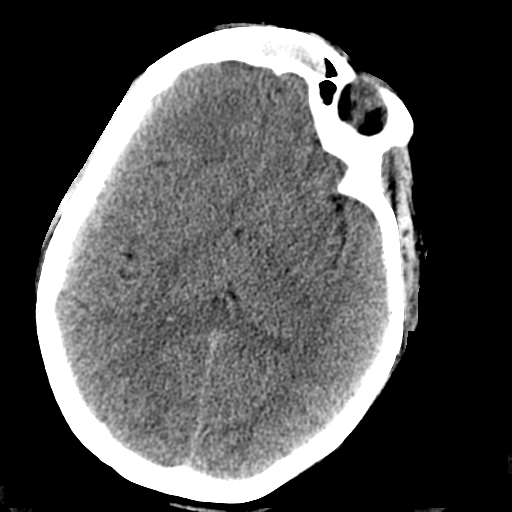
[im 16/30  brain]
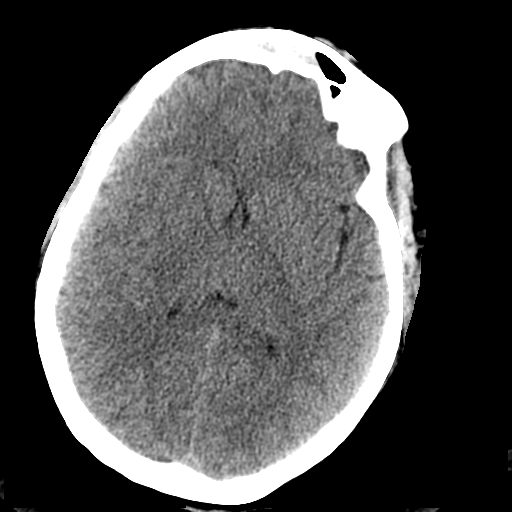
[im 16/30  bone]
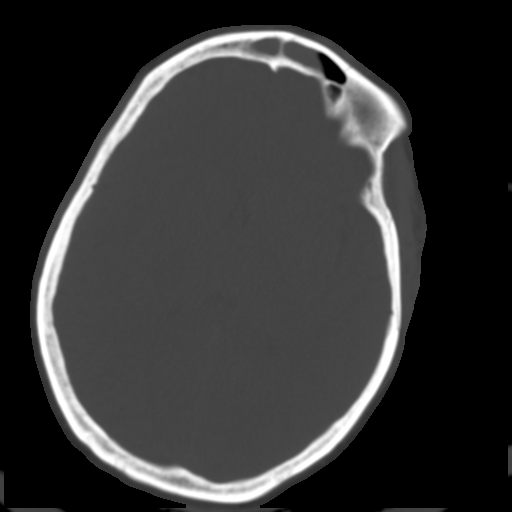
[im 18/30  brain]
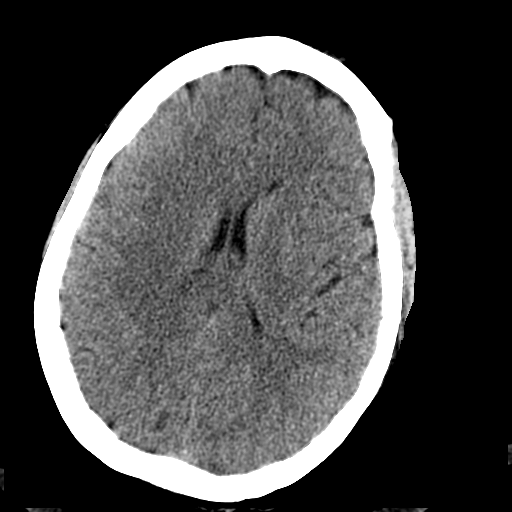
[im 20/30  brain]
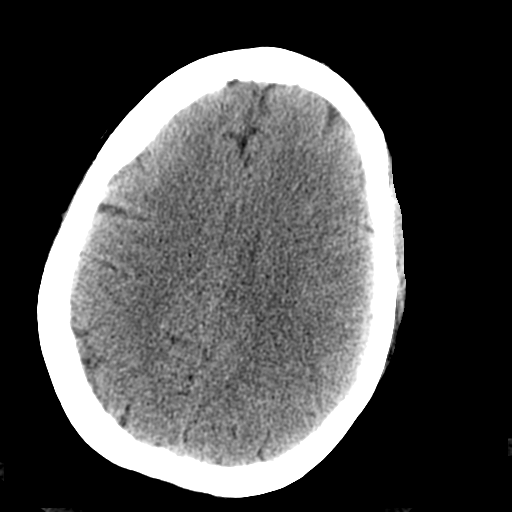
[im 22/30  brain]
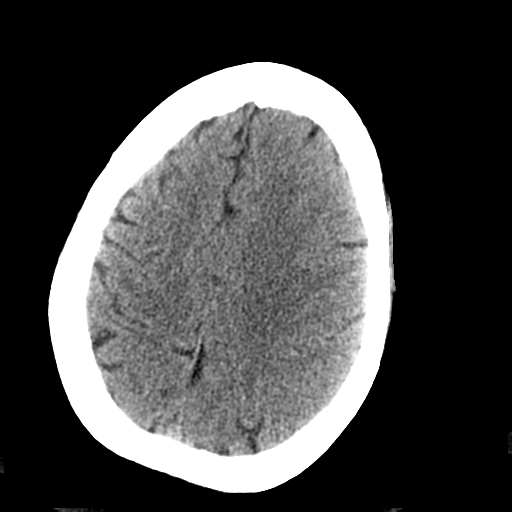
[im 23/30  brain]
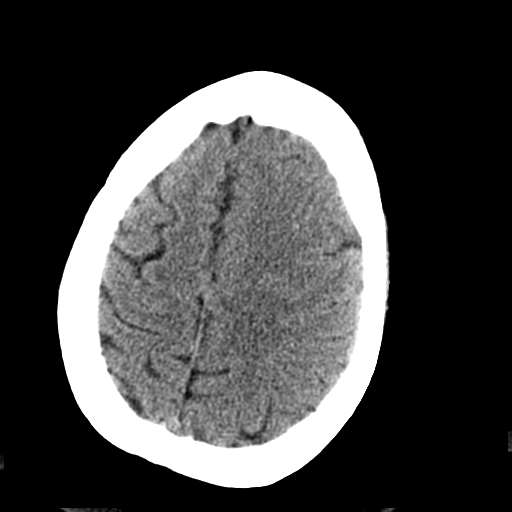
[im 23/30  bone]
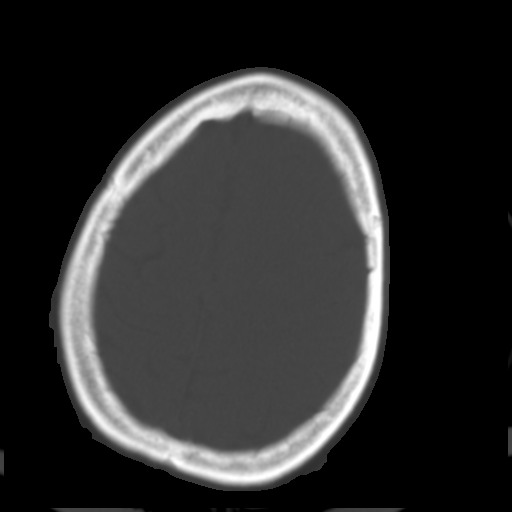
[im 25/30  brain]
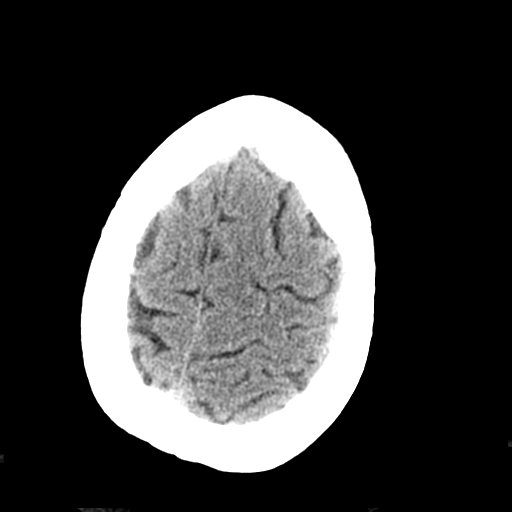
[im 27/30  brain]
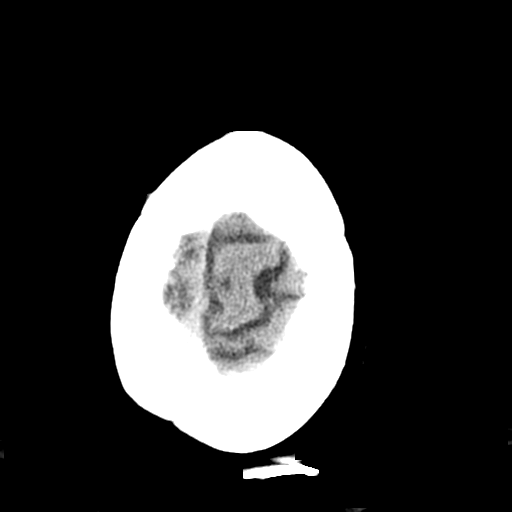
[im 29/30  brain]
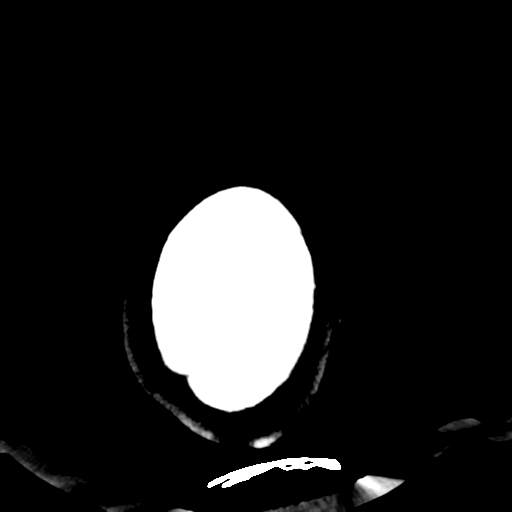

[16 of 30 positions shown; findings below may reference images not displayed]

FINDINGS: The brain demonstrates no evidence of hemorrhage,
infarction, edema, mass effect, extra-axial fluid collection,
hydrocephalus or mass lesion.  The skull is unremarkable.

Stable appearing mucosal thickening in frontal and bilateral
ethmoid air cells.  There also is a stable small amount of fluid in
a few posterior and inferior left sided mastoid air cells.  No
surrounding bony destruction or sclerosis.
IMPRESSION: Normal appearance of brain.  Chronic sinus disease and left-sided
mastoid fluid.

## 2013-04-28 IMAGING — CR DG CHEST 1V PORT
1 series · 1 of 1 positions shown · non-contrast
Comparison: 09/28/2011

CLINICAL DATA: Respiratory failure.  COPD.  Depression.  Abdominal
pain.

PORTABLE CHEST - 1 VIEW

[AP]
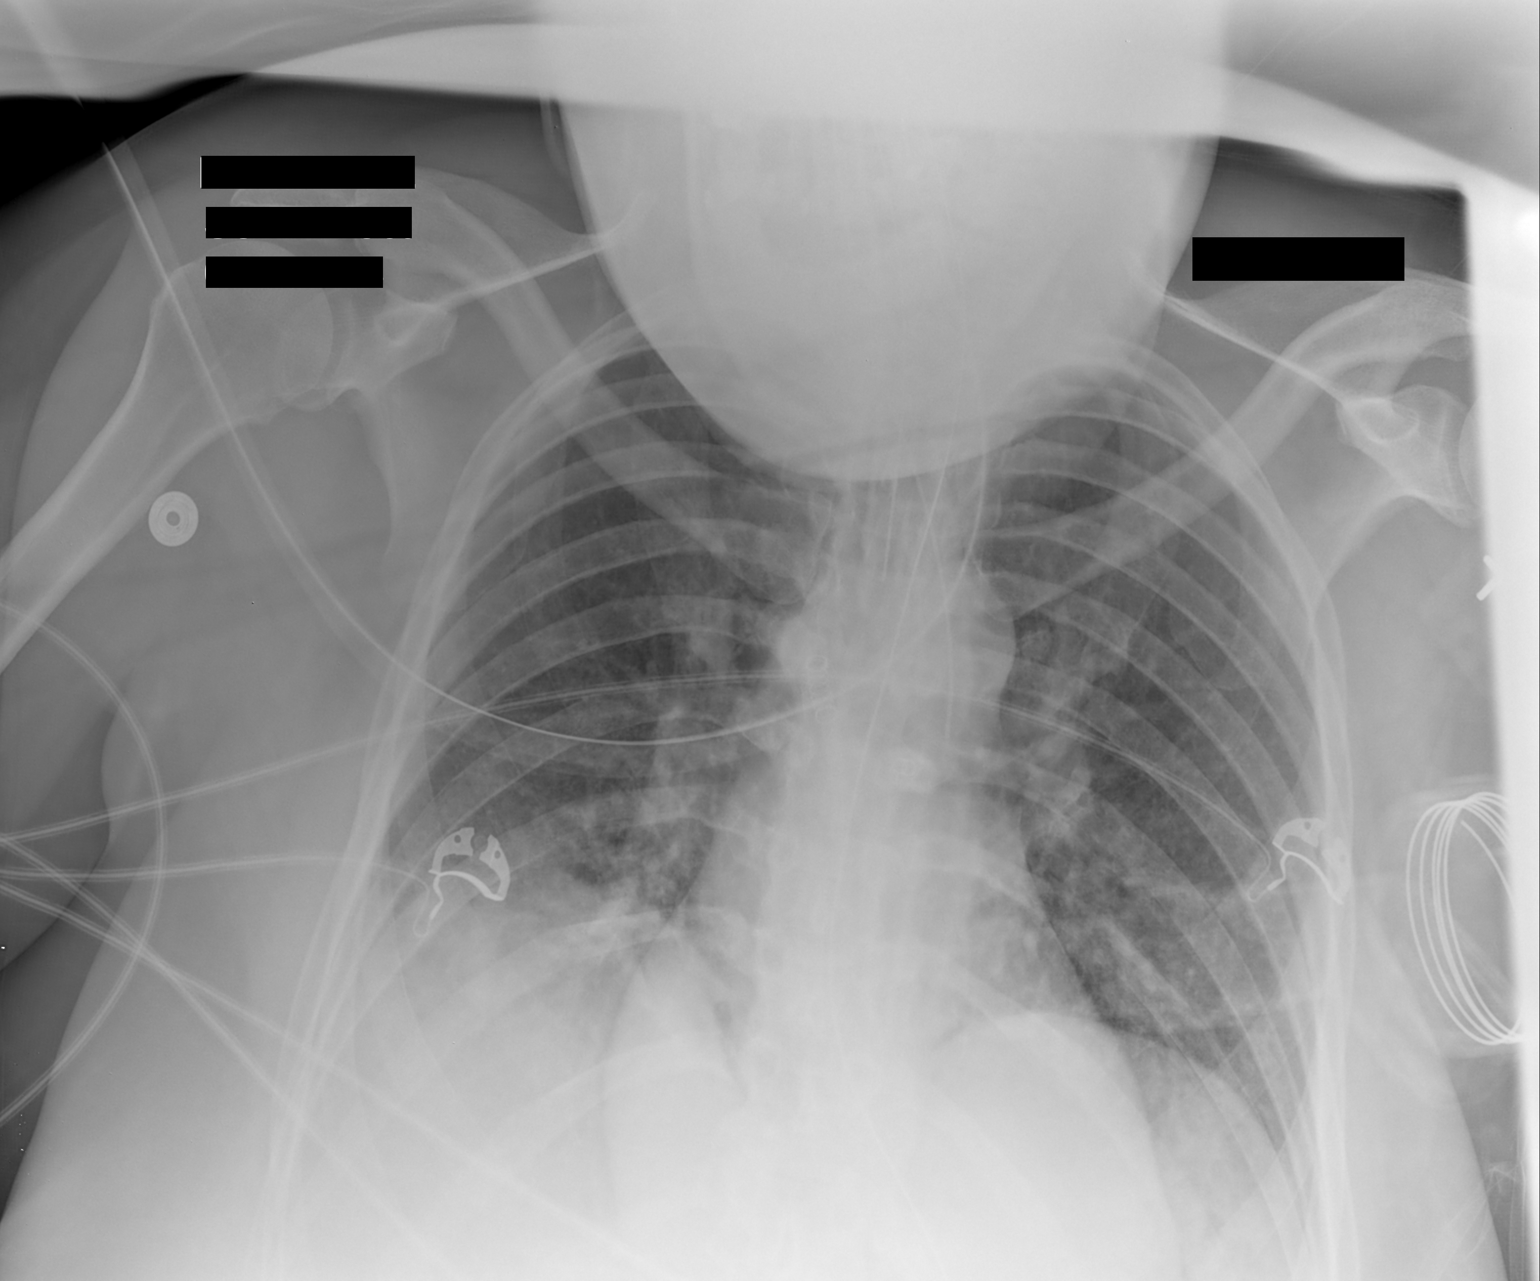

[1 of 1 positions shown; findings below may reference images not displayed]

FINDINGS: The chin overlies the apices.  Endotracheal tube is
unchanged in position, terminating at the level of the clavicles.
Nasogastric tube extends beyond the  inferior aspect of the film.
Reverse apical lordotic positioning.

Normal heart size.  Possible small right pleural effusion. No
pneumothorax.  Lung volumes remain low.  Mild left base
atelectasis.  Persistent right base air space disease.
IMPRESSION: 1. No significant change since one day prior.
2.  Persistent right base atelectasis versus infection with
possible small right pleural effusion.
3.  Suboptimal patient positioning.

## 2013-05-05 ENCOUNTER — Ambulatory Visit: Payer: PRIVATE HEALTH INSURANCE | Admitting: Podiatry

## 2013-05-17 ENCOUNTER — Ambulatory Visit: Payer: PRIVATE HEALTH INSURANCE | Admitting: Podiatry

## 2013-06-15 ENCOUNTER — Emergency Department (HOSPITAL_COMMUNITY)
Admission: EM | Admit: 2013-06-15 | Discharge: 2013-06-15 | Disposition: A | Discharge status: Home / Self Care | Payer: PRIVATE HEALTH INSURANCE | Attending: Emergency Medicine | Admitting: Emergency Medicine

## 2013-06-15 ENCOUNTER — Emergency Department (HOSPITAL_COMMUNITY): Payer: PRIVATE HEALTH INSURANCE

## 2013-06-15 ENCOUNTER — Encounter (HOSPITAL_COMMUNITY): Payer: Self-pay | Admitting: Emergency Medicine

## 2013-06-15 DIAGNOSIS — E039 Hypothyroidism, unspecified: Secondary | ICD-10-CM | POA: Insufficient documentation

## 2013-06-15 DIAGNOSIS — I10 Essential (primary) hypertension: Secondary | ICD-10-CM | POA: Insufficient documentation

## 2013-06-15 DIAGNOSIS — F17200 Nicotine dependence, unspecified, uncomplicated: Secondary | ICD-10-CM | POA: Insufficient documentation

## 2013-06-15 DIAGNOSIS — M549 Dorsalgia, unspecified: Secondary | ICD-10-CM | POA: Insufficient documentation

## 2013-06-15 DIAGNOSIS — F319 Bipolar disorder, unspecified: Secondary | ICD-10-CM | POA: Insufficient documentation

## 2013-06-15 DIAGNOSIS — R51 Headache: Secondary | ICD-10-CM | POA: Insufficient documentation

## 2013-06-15 DIAGNOSIS — F209 Schizophrenia, unspecified: Secondary | ICD-10-CM | POA: Insufficient documentation

## 2013-06-15 DIAGNOSIS — R5381 Other malaise: Secondary | ICD-10-CM | POA: Diagnosis not present

## 2013-06-15 DIAGNOSIS — G8929 Other chronic pain: Secondary | ICD-10-CM | POA: Insufficient documentation

## 2013-06-15 DIAGNOSIS — J449 Chronic obstructive pulmonary disease, unspecified: Secondary | ICD-10-CM | POA: Insufficient documentation

## 2013-06-15 DIAGNOSIS — K219 Gastro-esophageal reflux disease without esophagitis: Secondary | ICD-10-CM | POA: Diagnosis not present

## 2013-06-15 DIAGNOSIS — Z79899 Other long term (current) drug therapy: Secondary | ICD-10-CM | POA: Insufficient documentation

## 2013-06-15 DIAGNOSIS — J40 Bronchitis, not specified as acute or chronic: Secondary | ICD-10-CM

## 2013-06-15 DIAGNOSIS — F411 Generalized anxiety disorder: Secondary | ICD-10-CM | POA: Insufficient documentation

## 2013-06-15 DIAGNOSIS — M2550 Pain in unspecified joint: Secondary | ICD-10-CM | POA: Insufficient documentation

## 2013-06-15 DIAGNOSIS — I4891 Unspecified atrial fibrillation: Secondary | ICD-10-CM | POA: Insufficient documentation

## 2013-06-15 DIAGNOSIS — Z791 Long term (current) use of non-steroidal anti-inflammatories (NSAID): Secondary | ICD-10-CM | POA: Insufficient documentation

## 2013-06-15 DIAGNOSIS — E119 Type 2 diabetes mellitus without complications: Secondary | ICD-10-CM | POA: Diagnosis not present

## 2013-06-15 DIAGNOSIS — R079 Chest pain, unspecified: Secondary | ICD-10-CM | POA: Insufficient documentation

## 2013-06-15 DIAGNOSIS — R0602 Shortness of breath: Secondary | ICD-10-CM | POA: Diagnosis present

## 2013-06-15 DIAGNOSIS — J4489 Other specified chronic obstructive pulmonary disease: Secondary | ICD-10-CM | POA: Insufficient documentation

## 2013-06-15 DIAGNOSIS — Z794 Long term (current) use of insulin: Secondary | ICD-10-CM | POA: Insufficient documentation

## 2013-06-15 LAB — BASIC METABOLIC PANEL
BUN: 7 mg/dL (ref 6–23)
Calcium: 9.9 mg/dL (ref 8.4–10.5)
Chloride: 99 mEq/L (ref 96–112)
Glucose, Bld: 78 mg/dL (ref 70–99)
Potassium: 3.4 mEq/L — ABNORMAL LOW (ref 3.5–5.1)

## 2013-06-15 LAB — POCT I-STAT TROPONIN I: Troponin i, poc: 0 ng/mL (ref 0.00–0.08)

## 2013-06-15 LAB — CBC
HCT: 44.5 % (ref 36.0–46.0)
Hemoglobin: 15.3 g/dL — ABNORMAL HIGH (ref 12.0–15.0)
WBC: 11.9 10*3/uL — ABNORMAL HIGH (ref 4.0–10.5)

## 2013-06-15 MED ORDER — ALBUTEROL SULFATE (5 MG/ML) 0.5% IN NEBU
5.0000 mg | INHALATION_SOLUTION | Freq: Once | RESPIRATORY_TRACT | Status: AC
  Administered 2013-06-15: 5 mg via RESPIRATORY_TRACT
  Filled 2013-06-15: qty 1

## 2013-06-15 MED ORDER — METHYLPREDNISOLONE SODIUM SUCC 125 MG IJ SOLR
125.0000 mg | Freq: Once | INTRAMUSCULAR | Status: AC
  Administered 2013-06-15: 125 mg via INTRAVENOUS
  Filled 2013-06-15: qty 2

## 2013-06-15 MED ORDER — IPRATROPIUM BROMIDE 0.02 % IN SOLN
0.5000 mg | Freq: Once | RESPIRATORY_TRACT | Status: AC
  Administered 2013-06-15: 0.5 mg via RESPIRATORY_TRACT
  Filled 2013-06-15: qty 2.5

## 2013-06-15 MED ORDER — OXYCODONE-ACETAMINOPHEN 5-325 MG PO TABS
1.0000 | ORAL_TABLET | Freq: Once | ORAL | Status: AC
  Administered 2013-06-15: 1 via ORAL
  Filled 2013-06-15: qty 1

## 2013-06-15 MED ORDER — PREDNISONE 20 MG PO TABS: 40.0000 mg | ORAL_TABLET | Freq: Every day | ORAL | Status: DC

## 2013-06-15 NOTE — ED Provider Notes (Signed)
CSN: [id number]     Arrival date & time 06/15/13  1953 History   First MD Initiated Contact with Patient 06/15/13 1957     Chief Complaint  Patient presents with  . Shortness of Breath  . Chest Pain   (Consider location/radiation/quality/duration/timing/severity/associated sxs/prior Treatment) HPI [name] is a 47 y.o. female presents emergency department complaining of chest pain, shortness of breath, diffuse by aches. Patient states that her chest pain began yesterday. States pain is in the left chest. States she feels chest tightness and shortness of breath. States she thought it was "gas" and she took some antacids yesterday that helped. States this morning she woke up with the pain again however she did not try to take anything for it today. She states she also had a migraine today and states she took a "shot" of medicine this morning for her migraine which did not help. She states she also checked her blood sugar and it was in 200, she took her insulin, but states she forgot to take all her other medications. She states she takes oxycodone 10 mg 3 times a day for her pain and she did not take it today. She states she believes that is why she is having pain everywhere. She denies any associated nausea, dizziness, diaphoresis. She states she has had heart arrhythmia in the past but denies any history of coronary disease.  Past Medical History  Diagnosis Date  . Abdominal pain   . Diabetes mellitus     states her doctor took her off all DM meds in past month  . COPD (chronic obstructive pulmonary disease)   . GERD (gastroesophageal reflux disease)   . Chronic nausea   . Hypothyroidism   . Mental disorder     Bipolar and schizophrenic  . Depression   . Headache(784.0)   . Anxiety   . Atrial fibrillation 09/29/11    converted spontaneously  . Chronic pain   . Tobacco abuse   . HTN (hypertension)   . Accidental drug overdose April 2013   Past Surgical History  Procedure  Laterality Date  . Laparoscopic assisted vaginal hysterectomy  03/04/2011    Procedure: LAPAROSCOPIC ASSISTED VAGINAL HYSTERECTOMY;  Surgeon: Jeani Hawking, MD;  Location: [facility];  Service: Gynecology;  Laterality: N/A;  . Cystocele repair  03/04/2011    Procedure: ANTERIOR REPAIR (CYSTOCELE);  Surgeon: Martina Sinner;  Location: [facility];  Service: Urology;  Laterality: N/A;  . Bladder suspension  03/04/2011    Procedure: SPARC PROCEDURE;  Surgeon: Jonna Coup MacDiarmid;  Location: [facility];  Service: Urology;  Laterality: N/A;  . Cystoscopy  03/04/2011    Procedure: CYSTOSCOPY;  Surgeon: Jonna Coup MacDiarmid;  Location: [facility];  Service: Urology;  Laterality: N/A;   Family History  Problem Relation Age of Onset  . Heart attack Father     42s  . Heart attack Sister     82  . Diabetes Mother   . Heart disease Mother   . Hypertension Mother    History  Substance Use Topics  . Smoking status: Current Every Day Smoker -- 1.50 packs/day    Types: Cigarettes  . Smokeless tobacco: Not on file  . Alcohol Use: No   OB History   Grav Para Term Preterm Abortions TAB SAB Ect Mult Living   3 3   0 0 0 0 0 3     Review of Systems  Constitutional: Negative for fever and chills.  HENT: Negative for congestion  and ear pain.   Respiratory: Positive for chest tightness and shortness of breath. Negative for cough.   Cardiovascular: Positive for chest pain. Negative for palpitations and leg swelling.  Gastrointestinal: Negative for nausea, vomiting, abdominal pain and diarrhea.  Genitourinary: Negative for dysuria, flank pain, vaginal bleeding, vaginal discharge, vaginal pain and pelvic pain.  Musculoskeletal: Positive for arthralgias, back pain and myalgias. Negative for neck pain and neck stiffness.  Skin: Negative for rash.  Neurological: Positive for weakness and headaches. Negative for dizziness.  All other systems reviewed and are negative.    Allergies  Aspirin  Home Medications    Current Outpatient Rx  Name  Route  Sig  Dispense  Refill  . albuterol (PROVENTIL HFA;VENTOLIN HFA) 108 (90 BASE) MCG/ACT inhaler   Inhalation   Inhale 2 puffs into the lungs every 6 (six) hours as needed. For wheezing         . budesonide-formoterol (SYMBICORT) 160-4.5 MCG/ACT inhaler   Inhalation   Inhale 2 puffs into the lungs 2 (two) times daily.         Marland Kitchen buPROPion (WELLBUTRIN SR) 150 MG 12 hr tablet   Oral   Take 150 mg by mouth 2 (two) times daily.         . butalbital-acetaminophen-caffeine (FIORICET WITH CODEINE) 50-325-40-30 MG per capsule   Oral   Take 1 capsule by mouth every 4 (four) hours as needed for headache.         . clonazePAM (KLONOPIN) 0.5 MG tablet   Oral   Take 1 mg by mouth daily as needed for anxiety. For anxiety         . diltiazem (CARDIZEM CD) 240 MG 24 hr capsule   Oral   Take 240 mg by mouth daily.         . DULoxetine (CYMBALTA) 30 MG capsule   Oral   Take 30-60 mg by mouth 2 (two) times daily. 2 caps in am, 1 in pm         . gabapentin (NEURONTIN) 300 MG capsule   Oral   Take 300 mg by mouth 3 (three) times daily.         Marland Kitchen ibuprofen (ADVIL,MOTRIN) 800 MG tablet   Oral   Take 800 mg by mouth 2 (two) times daily.         . insulin aspart (NOVOLOG) 100 UNIT/ML injection   Subcutaneous   Inject into the skin 3 (three) times daily before meals.         Marland Kitchen levothyroxine (SYNTHROID, LEVOTHROID) 50 MCG tablet   Oral   Take 100 mcg by mouth daily.          Marland Kitchen lurasidone (LATUDA) 40 MG TABS   Oral   Take 80 mg by mouth daily with breakfast.          . metFORMIN (GLUCOPHAGE) 500 MG tablet   Oral   Take 500 mg by mouth 2 (two) times daily with a meal.         . Mirabegron ER 25 MG TB24   Oral   Take 1 tablet by mouth daily.         Marland Kitchen oxyCODONE-acetaminophen (PERCOCET) 10-325 MG per tablet   Oral   Take 1 tablet by mouth 3 (three) times daily as needed. For pain.         Marland Kitchen oxymorphone (OPANA ER) 40 MG  12 hr tablet   Oral   Take 40 mg by mouth every 8 (eight)  hours. For back pain         . pantoprazole (PROTONIX) 40 MG tablet   Oral   Take 40 mg by mouth daily.         . simvastatin (ZOCOR) 20 MG tablet   Oral   Take 20 mg by mouth daily.         . SUMATRIPTAN SUCCINATE IJ   Injection   Inject 0.5 mLs as directed as needed.         . tiotropium (SPIRIVA) 18 MCG inhalation capsule   Inhalation   Place 1 capsule (18 mcg total) into inhaler and inhale daily.   30 capsule   11   . traZODone (DESYREL) 100 MG tablet   Oral   Take 200 mg by mouth at bedtime.          BP 138/89  Pulse 76  Temp(Src) 97.8 F (36.6 C) (Oral)  SpO2 100%  LMP 01/08/2011 Physical Exam  Nursing note and vitals reviewed. Constitutional: She is oriented to person, place, and time. She appears well-developed and well-nourished. No distress.  HENT:  Head: Normocephalic.  Eyes: Conjunctivae are normal.  Neck: Neck supple.  Cardiovascular: Normal rate, regular rhythm and normal heart sounds.   Pulmonary/Chest: Effort normal. No respiratory distress. She has wheezes. She has no rales. She exhibits tenderness.  Tender to the left upper chest wall.  Abdominal: Soft. Bowel sounds are normal. She exhibits no distension. There is no tenderness. There is no rebound.  Musculoskeletal: She exhibits no edema.  Neurological: She is alert and oriented to person, place, and time.  Skin: Skin is warm and dry.  Psychiatric: She has a normal mood and affect. Her behavior is normal.    ED Course  Procedures (including critical care time) Labs Review Labs Reviewed  CBC - Abnormal; Notable for the following:    WBC 11.9 (*)    RBC 5.12 (*)    Hemoglobin 15.3 (*)    All other components within normal limits  BASIC METABOLIC PANEL - Abnormal; Notable for the following:    Potassium 3.4 (*)    All other components within normal limits  POCT I-STAT TROPONIN I   Imaging Review Dg Chest Port 1  View  06/15/2013   CLINICAL DATA:  Short of breath.  Chest pain.  EXAM: PORTABLE CHEST - 1 VIEW  COMPARISON:  12/03/2012.  FINDINGS: Cardiopericardial silhouette within normal limits. Mediastinal contours normal. Trachea midline. No airspace disease or effusion. Monitoring leads project over the chest.  IMPRESSION: No active disease.   Electronically Signed   By: Andreas Newport M.D.   On: 06/15/2013 20:19    EKG Interpretation   None       MDM   1. Bronchitis   2. Chest pain      Patient with chest pain, shortness of breath, pain everywhere. She did not take her pain medications today which could be the cause of her diffuse pain. Her chest pain sounds atypical. Is a nonexertional. Constant since yesterday. I cannot produce it with palpation of her left chest. On exam she does have diffuse wheezes. I suspect this could be do to bronchitis.  Labs are unremarkable, troponin negative, chest x-ray negative. She feels better after neb and she was given 125 mg of Solu-Medrol IV. I discussed patient Dr. Freida Busman who has seen patient as well. I suspect her chest pain is musculoskeletal, and she will need to continue nebs or inhaler at home for her wheezing and cough,  as well as a 5 day prednisone burst. She will need to continue taking her pain medications and followup with primary care Dr. Her vital signs are normal he does not think patient has a PE at this time.   Filed Vitals:   06/15/13 2138 06/15/13 2241 06/15/13 2254 06/15/13 2329  BP: 129/69 117/66  124/79  Pulse: 70 72  68  Temp:  97.9 F (36.6 C)  97.8 F (36.6 C)  TempSrc:  Oral  Oral  Resp: 18 16  18   SpO2: 95% 98% 95%       Lottie Mussel, PA-C 06/16/13 0131

## 2013-06-15 NOTE — ED Notes (Signed)
Here for SOB and CP started yesterday accompany by low back pain. Pain stabbing worsen with palpation and breathing. Lung sounds are wheezing. Sat 97% on RA, pt on 2 L [place] at home. Hx of COPD.

## 2013-06-15 NOTE — ED Provider Notes (Signed)
Medical screening examination/treatment/procedure(s) were conducted as a shared visit with non-physician practitioner(s) and myself.  I personally evaluated the patient during the encounter.    Date: 06/15/2013  Rate: 69  Rhythm: normal sinus rhythm  QRS Axis: normal  Intervals: normal  ST/T Wave abnormalities: nonspecific ST changes  Conduction Disutrbances:none  Narrative Interpretation:   Old EKG Reviewed: unchanged    EKG Interpretation   None      Patient here with 2 days of constant left-sided chest wall pain worse with movement. Does have a history of asthma and has a more short of breath with wheezing. No leg pain. No anginal type symptoms. Pain is reproducible on her chest wall. Patient's EKG is without acute ischemic changes. Patient will be treated for COPD exacerbation, she has home oxygen as well as an home nebulizer.  Toy Baker, MD 06/15/13 2240

## 2013-06-15 NOTE — ED Notes (Signed)
Also reports weakness, dizziness, and right upper leg soreness.

## 2013-06-16 NOTE — ED Provider Notes (Signed)
Medical screening examination/treatment/procedure(s) were conducted as a shared visit with non-physician practitioner(s) and myself.  I personally evaluated the patient during the encounter.  EKG Interpretation   None        Toy Baker, MD 06/16/13 2307

## 2013-07-04 ENCOUNTER — Encounter (INDEPENDENT_AMBULATORY_CARE_PROVIDER_SITE_OTHER): Payer: PRIVATE HEALTH INSURANCE | Admitting: Ophthalmology

## 2013-07-04 DIAGNOSIS — H43819 Vitreous degeneration, unspecified eye: Secondary | ICD-10-CM

## 2013-07-04 DIAGNOSIS — I10 Essential (primary) hypertension: Secondary | ICD-10-CM

## 2013-07-04 DIAGNOSIS — E11319 Type 2 diabetes mellitus with unspecified diabetic retinopathy without macular edema: Secondary | ICD-10-CM

## 2013-07-04 DIAGNOSIS — E1139 Type 2 diabetes mellitus with other diabetic ophthalmic complication: Secondary | ICD-10-CM

## 2013-07-04 DIAGNOSIS — H35039 Hypertensive retinopathy, unspecified eye: Secondary | ICD-10-CM

## 2013-07-04 DIAGNOSIS — H5340 Unspecified visual field defects: Secondary | ICD-10-CM

## 2013-07-04 DIAGNOSIS — E1165 Type 2 diabetes mellitus with hyperglycemia: Secondary | ICD-10-CM

## 2013-07-04 DIAGNOSIS — H2510 Age-related nuclear cataract, unspecified eye: Secondary | ICD-10-CM

## 2013-07-27 ENCOUNTER — Emergency Department (HOSPITAL_COMMUNITY): Payer: Worker's Compensation

## 2013-07-27 ENCOUNTER — Other Ambulatory Visit: Payer: Self-pay

## 2013-07-27 ENCOUNTER — Emergency Department (HOSPITAL_COMMUNITY)
Admission: EM | Admit: 2013-07-27 | Discharge: 2013-07-28 | Disposition: A | Discharge status: Home / Self Care | Payer: Worker's Compensation | Attending: Emergency Medicine | Admitting: Emergency Medicine

## 2013-07-27 ENCOUNTER — Encounter (HOSPITAL_COMMUNITY): Payer: Self-pay | Admitting: Emergency Medicine

## 2013-07-27 DIAGNOSIS — IMO0002 Reserved for concepts with insufficient information to code with codable children: Secondary | ICD-10-CM | POA: Insufficient documentation

## 2013-07-27 DIAGNOSIS — F411 Generalized anxiety disorder: Secondary | ICD-10-CM | POA: Insufficient documentation

## 2013-07-27 DIAGNOSIS — Z1231 Encounter for screening mammogram for malignant neoplasm of breast: Secondary | ICD-10-CM

## 2013-07-27 DIAGNOSIS — E119 Type 2 diabetes mellitus without complications: Secondary | ICD-10-CM | POA: Insufficient documentation

## 2013-07-27 DIAGNOSIS — I10 Essential (primary) hypertension: Secondary | ICD-10-CM | POA: Insufficient documentation

## 2013-07-27 DIAGNOSIS — J449 Chronic obstructive pulmonary disease, unspecified: Secondary | ICD-10-CM | POA: Insufficient documentation

## 2013-07-27 DIAGNOSIS — S99929A Unspecified injury of unspecified foot, initial encounter: Principal | ICD-10-CM

## 2013-07-27 DIAGNOSIS — S99919A Unspecified injury of unspecified ankle, initial encounter: Principal | ICD-10-CM

## 2013-07-27 DIAGNOSIS — Z79899 Other long term (current) drug therapy: Secondary | ICD-10-CM | POA: Insufficient documentation

## 2013-07-27 DIAGNOSIS — Y9301 Activity, walking, marching and hiking: Secondary | ICD-10-CM | POA: Insufficient documentation

## 2013-07-27 DIAGNOSIS — K219 Gastro-esophageal reflux disease without esophagitis: Secondary | ICD-10-CM | POA: Insufficient documentation

## 2013-07-27 DIAGNOSIS — Z794 Long term (current) use of insulin: Secondary | ICD-10-CM | POA: Insufficient documentation

## 2013-07-27 DIAGNOSIS — J4489 Other specified chronic obstructive pulmonary disease: Secondary | ICD-10-CM | POA: Insufficient documentation

## 2013-07-27 DIAGNOSIS — F17200 Nicotine dependence, unspecified, uncomplicated: Secondary | ICD-10-CM | POA: Insufficient documentation

## 2013-07-27 DIAGNOSIS — S8990XA Unspecified injury of unspecified lower leg, initial encounter: Secondary | ICD-10-CM | POA: Insufficient documentation

## 2013-07-27 DIAGNOSIS — G8929 Other chronic pain: Secondary | ICD-10-CM | POA: Insufficient documentation

## 2013-07-27 DIAGNOSIS — F209 Schizophrenia, unspecified: Secondary | ICD-10-CM | POA: Insufficient documentation

## 2013-07-27 DIAGNOSIS — I4891 Unspecified atrial fibrillation: Secondary | ICD-10-CM | POA: Insufficient documentation

## 2013-07-27 DIAGNOSIS — Y9289 Other specified places as the place of occurrence of the external cause: Secondary | ICD-10-CM | POA: Insufficient documentation

## 2013-07-27 DIAGNOSIS — M79606 Pain in leg, unspecified: Secondary | ICD-10-CM

## 2013-07-27 DIAGNOSIS — S0990XA Unspecified injury of head, initial encounter: Secondary | ICD-10-CM | POA: Insufficient documentation

## 2013-07-27 DIAGNOSIS — E039 Hypothyroidism, unspecified: Secondary | ICD-10-CM | POA: Insufficient documentation

## 2013-07-27 DIAGNOSIS — W010XXA Fall on same level from slipping, tripping and stumbling without subsequent striking against object, initial encounter: Secondary | ICD-10-CM | POA: Insufficient documentation

## 2013-07-27 DIAGNOSIS — F319 Bipolar disorder, unspecified: Secondary | ICD-10-CM | POA: Insufficient documentation

## 2013-07-27 DIAGNOSIS — W19XXXA Unspecified fall, initial encounter: Secondary | ICD-10-CM

## 2013-07-27 LAB — CBC
HCT: 39.8 % (ref 36.0–46.0)
Hemoglobin: 13.7 g/dL (ref 12.0–15.0)
MCH: 30 pg (ref 26.0–34.0)
MCHC: 34.4 g/dL (ref 30.0–36.0)
MCV: 87.3 fL (ref 78.0–100.0)
PLATELETS: 260 10*3/uL (ref 150–400)
RBC: 4.56 MIL/uL (ref 3.87–5.11)
RDW: 13.2 % (ref 11.5–15.5)
WBC: 13.5 10*3/uL — ABNORMAL HIGH (ref 4.0–10.5)

## 2013-07-27 LAB — BASIC METABOLIC PANEL
BUN: 11 mg/dL (ref 6–23)
CO2: 28 mEq/L (ref 19–32)
CREATININE: 0.74 mg/dL (ref 0.50–1.10)
Calcium: 8.9 mg/dL (ref 8.4–10.5)
Chloride: 99 mEq/L (ref 96–112)
Glucose, Bld: 138 mg/dL — ABNORMAL HIGH (ref 70–99)
Potassium: 4.2 mEq/L (ref 3.7–5.3)
Sodium: 139 mEq/L (ref 137–147)

## 2013-07-27 LAB — CK: CK TOTAL: 105 U/L (ref 7–177)

## 2013-07-27 MED ORDER — CYCLOBENZAPRINE HCL 10 MG PO TABS: 10.0000 mg | ORAL_TABLET | Freq: Two times a day (BID) | ORAL | Status: DC | PRN

## 2013-07-27 MED ORDER — HYDROCODONE-ACETAMINOPHEN 5-325 MG PO TABS: 1.0000 | ORAL_TABLET | Freq: Four times a day (QID) | ORAL | Status: DC | PRN

## 2013-07-27 MED ORDER — HYDROMORPHONE HCL PF 1 MG/ML IJ SOLN
1.0000 mg | Freq: Once | INTRAMUSCULAR | Status: AC
  Administered 2013-07-27: 1 mg via INTRAVENOUS
  Filled 2013-07-27: qty 1

## 2013-07-27 MED ORDER — DIAZEPAM 5 MG/ML IJ SOLN
5.0000 mg | Freq: Once | INTRAMUSCULAR | Status: AC
  Administered 2013-07-27: 5 mg via INTRAVENOUS
  Filled 2013-07-27: qty 2

## 2013-07-27 NOTE — ED Notes (Signed)
Patient talking on cell phone, would not finish conversation so that nursing staff could attempt PIV placement Will make MD aware

## 2013-07-27 NOTE — ED Notes (Signed)
Patient removed from back board MD at bedside

## 2013-07-27 NOTE — ED Notes (Signed)
Orders for pain medication and labs noted Will start PIV, obtain labs and medicate patient when she returns from testing

## 2013-07-27 NOTE — ED Notes (Signed)
Patient taken to radiology via stretcher  Patient in NAD upon leaving room

## 2013-07-27 NOTE — ED Provider Notes (Signed)
CSN: [id number]     Arrival date & time 07/27/13  2024 History   First MD Initiated Contact with Patient 07/27/13 2027     Chief Complaint  Patient presents with  . Fall   (Consider location/radiation/quality/duration/timing/severity/associated sxs/prior Treatment) HPI Comments: Found down in the parking lot. Was taking out the trash. From the scene, per friends, it looks like the trash can hit a parking curb and flipped over. The trash can in turn flipped her over.  She thinks she was in the parking lot for about 30 minutes, unconscious. She has no recollection of event fully.  Patient is a 48 y.o. female presenting with fall. The history is provided by the patient.  Fall This is a new problem. The current episode started 1 to 2 hours ago. Episode frequency: once. The problem has not changed since onset.Associated symptoms include headaches. Pertinent negatives include no chest pain, no abdominal pain and no shortness of breath. Nothing aggravates the symptoms.    Past Medical History  Diagnosis Date  . Abdominal pain   . Diabetes mellitus     states her doctor took her off all DM meds in past month  . COPD (chronic obstructive pulmonary disease)   . GERD (gastroesophageal reflux disease)   . Chronic nausea   . Hypothyroidism   . Mental disorder     Bipolar and schizophrenic  . Depression   . Headache(784.0)   . Anxiety   . Atrial fibrillation 09/29/11    converted spontaneously  . Chronic pain   . Tobacco abuse   . HTN (hypertension)   . Accidental drug overdose April 2013   Past Surgical History  Procedure Laterality Date  . Laparoscopic assisted vaginal hysterectomy  03/04/2011    Procedure: LAPAROSCOPIC ASSISTED VAGINAL HYSTERECTOMY;  Surgeon: Cyril Mourning, MD;  Location: [facility];  Service: Gynecology;  Laterality: N/A;  . Cystocele repair  03/04/2011    Procedure: ANTERIOR REPAIR (CYSTOCELE);  Surgeon: Reece Packer;  Location: [facility];  Service: Urology;   Laterality: N/A;  . Bladder suspension  03/04/2011    Procedure: [place] PROCEDURE;  Surgeon: Elayne Snare MacDiarmid;  Location: [facility];  Service: Urology;  Laterality: N/A;  . Cystoscopy  03/04/2011    Procedure: CYSTOSCOPY;  Surgeon: Elayne Snare MacDiarmid;  Location: [facility];  Service: Urology;  Laterality: N/A;   Family History  Problem Relation Age of Onset  . Heart attack Father     40s  . Heart attack Sister     66  . Diabetes Mother   . Heart disease Mother   . Hypertension Mother    History  Substance Use Topics  . Smoking status: Current Every Day Smoker -- 1.50 packs/day    Types: Cigarettes  . Smokeless tobacco: Not on file  . Alcohol Use: No   OB History   Grav Para Term Preterm Abortions TAB SAB Ect Mult Living   3 3   0 0 0 0 0 3     Review of Systems  Constitutional: Negative for fever.  Respiratory: Negative for cough and shortness of breath.   Cardiovascular: Negative for chest pain.  Gastrointestinal: Negative for abdominal pain.  Neurological: Positive for headaches.  All other systems reviewed and are negative.    Allergies  Aspirin  Home Medications   Current Outpatient Rx  Name  Route  Sig  Dispense  Refill  . albuterol (PROVENTIL HFA;VENTOLIN HFA) 108 (90 BASE) MCG/ACT inhaler   Inhalation   Inhale 2  puffs into the lungs every 6 (six) hours as needed. For wheezing         . budesonide-formoterol (SYMBICORT) 160-4.5 MCG/ACT inhaler   Inhalation   Inhale 2 puffs into the lungs 2 (two) times daily.         Marland Kitchen buPROPion (WELLBUTRIN SR) 150 MG 12 hr tablet   Oral   Take 150 mg by mouth 2 (two) times daily.         . butalbital-acetaminophen-caffeine (FIORICET WITH CODEINE) 50-325-40-30 MG per capsule   Oral   Take 1 capsule by mouth every 4 (four) hours as needed for headache.         . clonazePAM (KLONOPIN) 0.5 MG tablet   Oral   Take 1 mg by mouth daily as needed for anxiety. For anxiety         . diltiazem (CARDIZEM CD) 240 MG 24 hr  capsule   Oral   Take 240 mg by mouth daily.         . DULoxetine (CYMBALTA) 30 MG capsule   Oral   Take 30-60 mg by mouth 2 (two) times daily. 2 caps in am, 1 in pm         . gabapentin (NEURONTIN) 300 MG capsule   Oral   Take 300 mg by mouth 3 (three) times daily.         Marland Kitchen ibuprofen (ADVIL,MOTRIN) 800 MG tablet   Oral   Take 800 mg by mouth 2 (two) times daily.         . insulin aspart (NOVOLOG) 100 UNIT/ML injection   Subcutaneous   Inject 0-20 Units into the skin See admin instructions. Less than 150 no insulin, greater than 150 in the morning inject 20 units, greater than 150 in the evening inject 10 units         . levothyroxine (SYNTHROID, LEVOTHROID) 50 MCG tablet   Oral   Take 100 mcg by mouth daily.          Marland Kitchen lurasidone (LATUDA) 40 MG TABS   Oral   Take 80 mg by mouth daily with breakfast.          . metFORMIN (GLUCOPHAGE) 500 MG tablet   Oral   Take 500 mg by mouth 2 (two) times daily with a meal.         . Mirabegron ER 25 MG TB24   Oral   Take 1 tablet by mouth daily.         Marland Kitchen oxyCODONE-acetaminophen (PERCOCET) 10-325 MG per tablet   Oral   Take 1 tablet by mouth 3 (three) times daily as needed. For pain.         Marland Kitchen oxymorphone (OPANA ER) 40 MG 12 hr tablet   Oral   Take 40 mg by mouth every 8 (eight) hours. For back pain         . pantoprazole (PROTONIX) 40 MG tablet   Oral   Take 40 mg by mouth daily.         . predniSONE (DELTASONE) 20 MG tablet   Oral   Take 2 tablets (40 mg total) by mouth daily.   10 tablet   0   . simvastatin (ZOCOR) 20 MG tablet   Oral   Take 20 mg by mouth daily.         . SUMATRIPTAN SUCCINATE IJ   Injection   Inject 0.5 mLs as directed as needed.         Marland Kitchen  tiotropium (SPIRIVA) 18 MCG inhalation capsule   Inhalation   Place 1 capsule (18 mcg total) into inhaler and inhale daily.   30 capsule   11   . traZODone (DESYREL) 100 MG tablet   Oral   Take 200 mg by mouth at bedtime.           BP 134/93  Pulse 88  Temp(Src) 98.3 F (36.8 C) (Oral)  Resp 20  SpO2 96%  LMP 01/08/2011 Physical Exam  Nursing note and vitals reviewed. Constitutional: She is oriented to person, place, and time. She appears well-developed and well-nourished. No distress.  HENT:  Head: Normocephalic and atraumatic.  Eyes: EOM are normal. Pupils are equal, round, and reactive to light.  Neck: Normal range of motion. Neck supple.  Cardiovascular: Normal rate and regular rhythm.  Exam reveals no friction rub.   No murmur heard. Pulmonary/Chest: Effort normal and breath sounds normal. No respiratory distress. She has no wheezes. She has no rales.  Abdominal: Soft. She exhibits no distension. There is no tenderness. There is no rebound.  Musculoskeletal: She exhibits no edema.       Right hip: She exhibits decreased range of motion and tenderness.       Right knee: Tenderness (diffuse) found.       Right ankle: Tenderness (diffuse).       Cervical back: She exhibits decreased range of motion and bony tenderness.       Lumbar back: She exhibits bony tenderness (severe, lower).       Right upper leg: She exhibits tenderness (diffuse).       Right lower leg: She exhibits tenderness (diffuse) and bony tenderness.       Right foot: She exhibits decreased range of motion and bony tenderness (diffuse).  Neurological: She is alert and oriented to person, place, and time.  Skin: She is not diaphoretic.    ED Course  Procedures (including critical care time) Labs Review Labs Reviewed - No data to display Imaging Review Dg Hip Complete Right  07/27/2013   CLINICAL DATA:  Pain post fall  EXAM: RIGHT HIP - COMPLETE 2+ VIEW  COMPARISON:  None.  FINDINGS: There is no evidence of hip fracture or dislocation. There is no evidence of arthropathy or other focal bone abnormality.  IMPRESSION: Negative.   Electronically Signed   By: Arne Cleveland M.D.   On: 07/27/2013 21:34   Dg Femur Right  07/27/2013    CLINICAL DATA:  Pain post fall  EXAM: RIGHT FEMUR - 2 VIEW  COMPARISON:  None.  FINDINGS: There is no evidence of fracture or other focal bone lesions. Soft tissues are unremarkable. Spurring from the lateral aspect of the tibial plateau.  IMPRESSION: No acute abnormality   Electronically Signed   By: Arne Cleveland M.D.   On: 07/27/2013 21:35   Dg Tibia/fibula Right  07/27/2013   CLINICAL DATA:  Pain post fall  EXAM: RIGHT TIBIA AND FIBULA - 2 VIEW  COMPARISON:  None.  FINDINGS: Degenerative change in the lateral compartment of the knee. Spurring from the calcaneus. Negative for fracture, dislocation, or other acute bone abnormality.  IMPRESSION: No acute abnormality   Electronically Signed   By: Arne Cleveland M.D.   On: 07/27/2013 21:36   Dg Ankle Complete Right  07/27/2013   CLINICAL DATA:  Pain post fall  EXAM: RIGHT ANKLE - COMPLETE 3+ VIEW  COMPARISON:  None.  FINDINGS: Calcaneal spurs at the plantar aponeurosis and Achilles tendon insertion. Ankle mortise  intact. Negative for fracture, dislocation, or other acute bone abnormality. Normal mineralization and alignment.  IMPRESSION: No acute abnormality   Electronically Signed   By: Oley Balm M.D.   On: 07/27/2013 21:37   Ct Head Wo Contrast  07/27/2013   CLINICAL DATA:  Status post fall.  EXAM: CT HEAD WITHOUT CONTRAST  CT CERVICAL SPINE WITHOUT CONTRAST  TECHNIQUE: Multidetector CT imaging of the head and cervical spine was performed following the standard protocol without intravenous contrast. Multiplanar CT image reconstructions of the cervical spine were also generated.  COMPARISON:  Head CT scan 09/28/2011.  FINDINGS: CT HEAD FINDINGS  There is no evidence of acute intracranial abnormality including infarct, hemorrhage, mass lesion, mass effect, midline shift or abnormal extra-axial fluid collection. No hydrocephalus or pneumocephalus. Tiny amount of fluid in the left mastoid air cells is noted. The calvarium is intact.  CT CERVICAL SPINE  FINDINGS  Straightening of the normal cervical lordosis is noted. No fracture or malalignment is identified. Intervertebral disc space height is maintained. Imaged paraspinous structures are unremarkable. Lung apices are clear with some emphysematous disease noted.  IMPRESSION: No acute finding head or cervical spine.   Electronically Signed   By: Drusilla Kanner M.D.   On: 07/27/2013 21:23   Ct Cervical Spine Wo Contrast  07/27/2013   CLINICAL DATA:  Status post fall.  EXAM: CT HEAD WITHOUT CONTRAST  CT CERVICAL SPINE WITHOUT CONTRAST  TECHNIQUE: Multidetector CT imaging of the head and cervical spine was performed following the standard protocol without intravenous contrast. Multiplanar CT image reconstructions of the cervical spine were also generated.  COMPARISON:  Head CT scan 09/28/2011.  FINDINGS: CT HEAD FINDINGS  There is no evidence of acute intracranial abnormality including infarct, hemorrhage, mass lesion, mass effect, midline shift or abnormal extra-axial fluid collection. No hydrocephalus or pneumocephalus. Tiny amount of fluid in the left mastoid air cells is noted. The calvarium is intact.  CT CERVICAL SPINE FINDINGS  Straightening of the normal cervical lordosis is noted. No fracture or malalignment is identified. Intervertebral disc space height is maintained. Imaged paraspinous structures are unremarkable. Lung apices are clear with some emphysematous disease noted.  IMPRESSION: No acute finding head or cervical spine.   Electronically Signed   By: Drusilla Kanner M.D.   On: 07/27/2013 21:23   Ct Lumbar Spine Wo Contrast  07/27/2013   CLINICAL DATA:  Fall.  EXAM: CT LUMBAR SPINE WITHOUT CONTRAST  TECHNIQUE: Multidetector CT imaging of the lumbar spine was performed without intravenous contrast administration. Multiplanar CT image reconstructions were also generated.  COMPARISON:  Lumbar spine MRI 07/23/2013  FINDINGS: No acute fracture or subluxation. There is facet osteoarthritis with bony  overgrowth, especially at L3-4 and below. The foramina and canal were recently evaluated by MRI, 07/23/2013. No evidence of interval canal or foraminal stenosis. No perivertebral abnormality to explain back pain. Early aortic atherosclerosis.  IMPRESSION: Negative for lumbar spine injury.   Electronically Signed   By: Tiburcio Pea M.D.   On: 07/27/2013 23:14   Dg Knee Complete 4 Views Right  07/27/2013   CLINICAL DATA:  Pain post fall  EXAM: RIGHT KNEE - COMPLETE 4+ VIEW  COMPARISON:  None.  FINDINGS: Mild narrowing of the articular cartilage in lateral compartment with small spurs from the lateral margin tibial plateau and lateral femoral condyle. Negative for fracture, dislocation, or other acute bone abnormality. No effusion.  IMPRESSION: 1. Negative for fracture or other acute abnormality. 2. Early degenerative change in the lateral compartment  as above.   Electronically Signed   By: Arne Cleveland M.D.   On: 07/27/2013 21:36   Dg Foot Complete Right  07/27/2013   CLINICAL DATA:  Pain post trauma  EXAM: RIGHT FOOT COMPLETE - 3+ VIEW  COMPARISON:  None.  FINDINGS: Frontal, oblique, and lateral views were obtained. There is no apparent fracture or dislocation. There is narrowing of the first MTP joint as well as all PIP and DIP joints. No erosive change. There are spurs arising from the posterior and inferior calcaneus.  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 Calcaneal spurs. No fracture or dislocation.   Electronically Signed   By: Lowella Grip M.D.   On: 07/27/2013 21:30    EKG Interpretation   None       MDM   1. Fall   2. Leg pain    21F here after fall in parking lot at work. Unconscious for some time, around 30 minutes. Here complaining of headachese, neck pain, lower back pain, R leg pain. AFVSS here. Lungs clear, belly benign. C-spine tenderness. Severe L-spine tenderness. Diffuse RLE pain without bony deformity. Will xray RLE, scan Head, neck, L-spine. Pain meds  given.  All imaging normal. Patient feeling better pain meds. LE strength normal, just pain with ROM. Given pain meds, muscle relaxers.  I have reviewed all labs and imaging and considered them in my medical decision making.   Osvaldo Shipper, MD 07/27/13 [phone]

## 2013-07-27 NOTE — ED Notes (Signed)
MD at bedside. 

## 2013-07-27 NOTE — ED Notes (Signed)
Patient walking across parking lot, tripped over curb and fell Patient states that she thinks she was out in parking lot for about 30 minutes before anyone noticed Patient with c/o to multiple sites--no obvious deformities Patient with hx of back problems

## 2013-07-27 NOTE — ED Notes (Signed)
Bed: DG64 Expected date:  Expected time:  Means of arrival:  Comments: EMS/fall/immobilized

## 2013-07-27 NOTE — ED Notes (Signed)
Patient back from radiology Attempted to place PIV site to give medication without success Will have another RN attempt placement

## 2013-07-27 NOTE — Discharge Instructions (Signed)
Musculoskeletal Pain °Musculoskeletal pain is muscle and boney aches and pains. These pains can occur in any part of the body. Your caregiver may treat you without knowing the cause of the pain. They may treat you if blood or urine tests, X-rays, and other tests were normal.  °CAUSES °There is often not a definite cause or reason for these pains. These pains may be caused by a type of germ (virus). The discomfort may also come from overuse. Overuse includes working out too hard when your body is not fit. Boney aches also come from weather changes. Bone is sensitive to atmospheric pressure changes. °HOME CARE INSTRUCTIONS  °· Ask when your test results will be ready. Make sure you get your test results. °· Only take over-the-counter or prescription medicines for pain, discomfort, or fever as directed by your caregiver. If you were given medications for your condition, do not drive, operate machinery or power tools, or sign legal documents for 24 hours. Do not drink alcohol. Do not take sleeping pills or other medications that may interfere with treatment. °· Continue all activities unless the activities cause more pain. When the pain lessens, slowly resume normal activities. Gradually increase the intensity and duration of the activities or exercise. °· During periods of severe pain, bed rest may be helpful. Lay or sit in any position that is comfortable. °· Putting ice on the injured area. °· Put ice in a bag. °· Place a towel between your skin and the bag. °· Leave the ice on for 15 to 20 minutes, 3 to 4 times a day. °· Follow up with your caregiver for continued problems and no reason can be found for the pain. If the pain becomes worse or does not go away, it may be necessary to repeat tests or do additional testing. Your caregiver may need to look further for a possible cause. °SEEK IMMEDIATE MEDICAL CARE IF: °· You have pain that is getting worse and is not relieved by medications. °· You develop chest pain  that is associated with shortness or breath, sweating, feeling sick to your stomach (nauseous), or throw up (vomit). °· Your pain becomes localized to the abdomen. °· You develop any new symptoms that seem different or that concern you. °MAKE SURE YOU:  °· Understand these instructions. °· Will watch your condition. °· Will get help right away if you are not doing well or get worse. °Document Released: 06/09/2005 Document Revised: 09/01/2011 Document Reviewed: 02/11/2013 °[employer]® Patient Information ©2014 [employer]. ° °

## 2013-08-12 ENCOUNTER — Ambulatory Visit
Admission: RE | Admit: 2013-08-12 | Discharge: 2013-08-12 | Disposition: A | Discharge status: Home / Self Care | Payer: PRIVATE HEALTH INSURANCE | Source: Ambulatory Visit

## 2013-08-12 DIAGNOSIS — M545 Low back pain, unspecified: Secondary | ICD-10-CM | POA: Insufficient documentation

## 2013-08-12 DIAGNOSIS — M5137 Other intervertebral disc degeneration, lumbosacral region: Secondary | ICD-10-CM | POA: Insufficient documentation

## 2013-08-12 DIAGNOSIS — M51379 Other intervertebral disc degeneration, lumbosacral region without mention of lumbar back pain or lower extremity pain: Secondary | ICD-10-CM | POA: Insufficient documentation

## 2013-08-12 DIAGNOSIS — Z1231 Encounter for screening mammogram for malignant neoplasm of breast: Secondary | ICD-10-CM

## 2013-09-04 ENCOUNTER — Emergency Department (HOSPITAL_COMMUNITY)
Admission: EM | Admit: 2013-09-04 | Discharge: 2013-09-04 | Disposition: A | Discharge status: Home / Self Care | Payer: PRIVATE HEALTH INSURANCE | Attending: Emergency Medicine | Admitting: Emergency Medicine

## 2013-09-04 ENCOUNTER — Emergency Department (HOSPITAL_COMMUNITY): Payer: PRIVATE HEALTH INSURANCE

## 2013-09-04 ENCOUNTER — Encounter (HOSPITAL_COMMUNITY): Payer: Self-pay | Admitting: Emergency Medicine

## 2013-09-04 DIAGNOSIS — F209 Schizophrenia, unspecified: Secondary | ICD-10-CM | POA: Insufficient documentation

## 2013-09-04 DIAGNOSIS — I10 Essential (primary) hypertension: Secondary | ICD-10-CM | POA: Insufficient documentation

## 2013-09-04 DIAGNOSIS — E039 Hypothyroidism, unspecified: Secondary | ICD-10-CM | POA: Insufficient documentation

## 2013-09-04 DIAGNOSIS — J441 Chronic obstructive pulmonary disease with (acute) exacerbation: Secondary | ICD-10-CM | POA: Insufficient documentation

## 2013-09-04 DIAGNOSIS — F17200 Nicotine dependence, unspecified, uncomplicated: Secondary | ICD-10-CM | POA: Insufficient documentation

## 2013-09-04 DIAGNOSIS — M549 Dorsalgia, unspecified: Secondary | ICD-10-CM | POA: Insufficient documentation

## 2013-09-04 DIAGNOSIS — F329 Major depressive disorder, single episode, unspecified: Secondary | ICD-10-CM | POA: Insufficient documentation

## 2013-09-04 DIAGNOSIS — R51 Headache: Secondary | ICD-10-CM | POA: Insufficient documentation

## 2013-09-04 DIAGNOSIS — Z79899 Other long term (current) drug therapy: Secondary | ICD-10-CM | POA: Insufficient documentation

## 2013-09-04 DIAGNOSIS — G8929 Other chronic pain: Secondary | ICD-10-CM | POA: Insufficient documentation

## 2013-09-04 DIAGNOSIS — F3289 Other specified depressive episodes: Secondary | ICD-10-CM | POA: Insufficient documentation

## 2013-09-04 DIAGNOSIS — F411 Generalized anxiety disorder: Secondary | ICD-10-CM | POA: Insufficient documentation

## 2013-09-04 DIAGNOSIS — E119 Type 2 diabetes mellitus without complications: Secondary | ICD-10-CM | POA: Insufficient documentation

## 2013-09-04 DIAGNOSIS — T887XXA Unspecified adverse effect of drug or medicament, initial encounter: Secondary | ICD-10-CM

## 2013-09-04 DIAGNOSIS — Z794 Long term (current) use of insulin: Secondary | ICD-10-CM | POA: Insufficient documentation

## 2013-09-04 DIAGNOSIS — K219 Gastro-esophageal reflux disease without esophagitis: Secondary | ICD-10-CM | POA: Insufficient documentation

## 2013-09-04 HISTORY — DX: Schizophrenia, unspecified: F20.9

## 2013-09-04 LAB — BASIC METABOLIC PANEL
BUN: 11 mg/dL (ref 6–23)
CO2: 25 mEq/L (ref 19–32)
Calcium: 9.3 mg/dL (ref 8.4–10.5)
Chloride: 100 mEq/L (ref 96–112)
Creatinine, Ser: 0.65 mg/dL (ref 0.50–1.10)
Glucose, Bld: 167 mg/dL — ABNORMAL HIGH (ref 70–99)
POTASSIUM: 3.5 meq/L — AB (ref 3.7–5.3)
SODIUM: 141 meq/L (ref 137–147)

## 2013-09-04 LAB — CBC WITH DIFFERENTIAL/PLATELET
BASOS PCT: 0 % (ref 0–1)
Basophils Absolute: 0 10*3/uL (ref 0.0–0.1)
Eosinophils Absolute: 0.1 10*3/uL (ref 0.0–0.7)
Eosinophils Relative: 1 % (ref 0–5)
HCT: 41.3 % (ref 36.0–46.0)
Hemoglobin: 14.4 g/dL (ref 12.0–15.0)
Lymphocytes Relative: 41 % (ref 12–46)
Lymphs Abs: 4.4 10*3/uL — ABNORMAL HIGH (ref 0.7–4.0)
MCH: 30.5 pg (ref 26.0–34.0)
MCHC: 34.9 g/dL (ref 30.0–36.0)
MCV: 87.5 fL (ref 78.0–100.0)
Monocytes Absolute: 0.4 10*3/uL (ref 0.1–1.0)
Monocytes Relative: 4 % (ref 3–12)
NEUTROS PCT: 54 % (ref 43–77)
Neutro Abs: 5.7 10*3/uL (ref 1.7–7.7)
PLATELETS: 214 10*3/uL (ref 150–400)
RBC: 4.72 MIL/uL (ref 3.87–5.11)
RDW: 12.7 % (ref 11.5–15.5)
WBC: 10.6 10*3/uL — ABNORMAL HIGH (ref 4.0–10.5)

## 2013-09-04 LAB — URINALYSIS, ROUTINE W REFLEX MICROSCOPIC
Bilirubin Urine: NEGATIVE
Glucose, UA: NEGATIVE mg/dL
HGB URINE DIPSTICK: NEGATIVE
Ketones, ur: NEGATIVE mg/dL
Leukocytes, UA: NEGATIVE
Nitrite: NEGATIVE
PROTEIN: NEGATIVE mg/dL
Specific Gravity, Urine: 1.02 (ref 1.005–1.030)
UROBILINOGEN UA: 0.2 mg/dL (ref 0.0–1.0)
pH: 5 (ref 5.0–8.0)

## 2013-09-04 LAB — TROPONIN I

## 2013-09-04 MED ORDER — DEXAMETHASONE SODIUM PHOSPHATE 10 MG/ML IJ SOLN
10.0000 mg | Freq: Once | INTRAMUSCULAR | Status: AC
  Administered 2013-09-04: 10 mg via INTRAMUSCULAR
  Filled 2013-09-04: qty 1

## 2013-09-04 MED ORDER — ALBUTEROL SULFATE (2.5 MG/3ML) 0.083% IN NEBU
5.0000 mg | INHALATION_SOLUTION | Freq: Once | RESPIRATORY_TRACT | Status: AC
  Administered 2013-09-04: 5 mg via RESPIRATORY_TRACT
  Filled 2013-09-04: qty 6

## 2013-09-04 MED ORDER — PREDNISONE 50 MG PO TABS: 50.0000 mg | ORAL_TABLET | Freq: Every day | ORAL | Status: DC

## 2013-09-04 MED ORDER — FENTANYL CITRATE 0.05 MG/ML IJ SOLN
50.0000 ug | Freq: Once | INTRAMUSCULAR | Status: AC
  Administered 2013-09-04: 50 ug via INTRAMUSCULAR
  Filled 2013-09-04: qty 2

## 2013-09-04 MED ORDER — IPRATROPIUM BROMIDE 0.02 % IN SOLN
0.5000 mg | Freq: Once | RESPIRATORY_TRACT | Status: AC
  Administered 2013-09-04: 0.5 mg via RESPIRATORY_TRACT
  Filled 2013-09-04: qty 2.5

## 2013-09-04 NOTE — ED Notes (Signed)
Patient transported to X-ray 

## 2013-09-04 NOTE — ED Notes (Signed)
Pt. progressing right leg pain , low back pain and headache for the past several days , pt. stated that she fell last month and ran out of her prescription pain medications .

## 2013-09-04 NOTE — Discharge Instructions (Signed)
Please take the meds as prescribed. Please see your primary care doctor for your breathing re-check, and also medication recheck - as you might be on too much medications for pain.   Chronic Obstructive Pulmonary Disease Chronic obstructive pulmonary disease (COPD) is a common lung condition in which airflow from the lungs is limited. COPD is a general term that can be used to describe many different lung problems that limit airflow, including both chronic bronchitis and emphysema. If you have COPD, your lung function will probably never return to normal, but there are measures you can take to improve lung function and make yourself feel better.  CAUSES   Smoking (common).   Exposure to secondhand smoke.   Genetic problems.  Chronic inflammatory lung diseases or recurrent infections. SYMPTOMS   Shortness of breath, especially with physical activity.   Deep, persistent (chronic) cough with a large amount of thick mucus.   Wheezing.   Rapid breaths (tachypnea).   Gray or bluish discoloration (cyanosis) of the skin, especially in fingers, toes, or lips.   Fatigue.   Weight loss.   Frequent infections or episodes when breathing symptoms become much worse (exacerbations).   Chest tightness. DIAGNOSIS  Your healthcare provider will take a medical history and perform a physical examination to make the initial diagnosis. Additional tests for COPD may include:   Lung (pulmonary) function tests.  Chest X-ray.  CT scan.  Blood tests. TREATMENT  Treatment available to help you feel better when you have COPD include:   Inhaler and nebulizer medicines. These help manage the symptoms of COPD and make your breathing more comfortable  Supplemental oxygen. Supplemental oxygen is only helpful if you have a low oxygen level in your blood.   Exercise and physical activity. These are beneficial for nearly all people with COPD. Some people may also benefit from a pulmonary  rehabilitation program. HOME CARE INSTRUCTIONS   Take all medicines (inhaled or pills) as directed by your health care provider.  Only take over-the-counter or prescription medicines for pain, fever, or discomfort as directed by your health care provider.   Avoid over-the-counter medicines or cough syrups that dry up your airway (such as antihistamines) and slow down the elimination of secretions unless instructed otherwise by your healthcare provider.   If you are a smoker, the most important thing that you can do is stop smoking. Continuing to smoke will cause further lung damage and breathing trouble. Ask your health care provider for help with quitting smoking. He or she can direct you to community resources or hospitals that provide support.  Avoid exposure to irritants such as smoke, chemicals, and fumes that aggravate your breathing.  Use oxygen therapy and pulmonary rehabilitation if directed by your health care provider. If you require home oxygen therapy, ask your healthcare provider whether you should purchase a pulse oximeter to measure your oxygen level at home.   Avoid contact with individuals who have a contagious illness.  Avoid extreme temperature and humidity changes.  Eat healthy foods. Eating smaller, more frequent meals and resting before meals may help you maintain your strength.  Stay active, but balance activity with periods of rest. Exercise and physical activity will help you maintain your ability to do things you want to do.  Preventing infection and hospitalization is very important when you have COPD. Make sure to receive all the vaccines your health care provider recommends, especially the pneumococcal and influenza vaccines. Ask your healthcare provider whether you need a pneumonia vaccine.  Learn  and use relaxation techniques to manage stress.  Learn and use controlled breathing techniques as directed by your health care provider. Controlled breathing  techniques include:   Pursed lip breathing. Start by breathing in (inhaling) through your nose for 1 second. Then, purse your lips as if you were going to whistle and breathe out (exhale) through the pursed lips for 2 seconds.   Diaphragmatic breathing. Start by putting one hand on your abdomen just above your waist. Inhale slowly through your nose. The hand on your abdomen should move out. Then purse your lips and exhale slowly. You should be able to feel the hand on your abdomen moving in as you exhale.   Learn and use controlled coughing to clear mucus from your lungs. Controlled coughing is a series of short, progressive coughs. The steps of controlled coughing are:  1. Lean your head slightly forward.  2. Breathe in deeply using diaphragmatic breathing.  3. Try to hold your breath for 3 seconds.  4. Keep your mouth slightly open while coughing twice.  5. Spit any mucus out into a tissue.  6. Rest and repeat the steps once or twice as needed. SEEK MEDICAL CARE IF:   You are coughing up more mucus than usual.   There is a change in the color or thickness of your mucus.   Your breathing is more labored than usual.   Your breathing is faster than usual.  SEEK IMMEDIATE MEDICAL CARE IF:   You have shortness of breath while you are resting.   You have shortness of breath that prevents you from:  Being able to talk.   Performing your usual physical activities.   You have chest pain lasting longer than 5 minutes.   Your skin color is more cyanotic than usual.  You measure low oxygen saturations for longer than 5 minutes with a pulse oximeter. MAKE SURE YOU:   Understand these instructions.  Will watch your condition.  Will get help right away if you are not doing well or get worse. Document Released: 03/19/2005 Document Revised: 03/30/2013 Document Reviewed: 02/03/2013 [facility] Patient Information 2014 [place], [place].  Polypharmacy Problems Polypharmacy  problems can occur when you take more than one medicine. Your caregivers need to know about all the medicines you take. This includes vitamins, herbs, eyedrops, over-the-counter medicines, prescription medicines, and creams. Drug interaction problems can include:  Bad reactions or side effects. This can occur when certain drugs are taken together.  Reduced benefit from your medicines. This can occur if one drug reduces the ability of another drug to work. Some drug interaction problems can be life-threatening. Your caregivers can coordinate a plan to help you avoid polypharmacy problems. RISK FACTORS Polypharmacy problems are more likely to occur if:  You have many caregivers prescribing different medicines for you. This is a common problem for elderly patients.  You have a long-term (chronic) medical condition.  You have had an organ transplant.  You have human immunodeficiency virus (HIV).  You are undergoing chemotherapy.  You have hepatitis.  You have kidney disease or kidney failure.  You have significant heart disease or lung disease.  You are elderly.  You are taking medicines that have a low margin of error. This means there is little difference between the right amount of medicine and too much medicine. Medicines in this category include:  Warfarin.  Digoxin.  Lithium.  Theophylline.  Monoamine oxidase (MAO) inhibitors.  Seizure medicines.  Cyclosporine. PREVENTION   Choose one caregiver to be in  charge of coordinating your medicines. Inform this caregiver about all medicines and supplements you take, even if they are prescribed by another caregiver.  Purchase all your medicines through the same pharmacy. The pharmacy keeps track of your medicines and possible drug interactions. They can notify your caregiver of potential problems.  Read the information given to you at your pharmacy.  Carry a list of all your medicines and their doses with you. This informs  your caregivers, emergency department caregivers, and specialists about the medicines you are taking. This is especially important before you start a new medicine.  Use a system to keep track of which medicines you are taking and when. Many patients use a pillbox that separates medicines by the day and time they are supposed to be taken.  Carry a list of your chronic medical problems with you. Conditions such as kidney problems, liver problems, organ transplants, and chronic viral infections affect the way your body handles medicines.  Ask your caregiver or pharmacist if you have any questions about your medicines. SEEK MEDICAL CARE IF:  You have any problems that may be caused by your medicines.  You have chest pain.  You have shortness of breath.  You have an irregular heartbeat.  You have fainting spells.  You have shaking or tremors.  You have weakness or tiredness (lethargy).  You have a rash or swelling in any part of the body.  You have increased bleeding, rectal bleeding, vaginal bleeding, or you bruise easily.  You have abdominal pain.  You have nausea.  You have vomiting. Document Released: 07/17/2004 Document Revised: 09/01/2011 Document Reviewed: 02/26/2011 [facility] Patient Information 2014 [place], [place].

## 2013-09-05 NOTE — ED Provider Notes (Signed)
CSN: [id number]     Arrival date & time 09/04/13  0208 History   First MD Initiated Contact with Patient 09/04/13 0325     Chief Complaint  Patient presents with  . Leg Pain  . Back Pain  . Headache     (Consider location/radiation/quality/duration/timing/severity/associated sxs/prior Treatment) HPI Comments: Pt comes in with cc of diffuse pain and an episode of respiratory depression. Patient is a very poor historian. Hx of chronic pain back, DM, COPD, Anxiety, Afib and psych conditions. She reports that she came to the ER because of serious back pain - that is present, despite her taking her night pain meds. The pain is similar to her previous pain, just more severe, and is radiating to her right leg. She has no associated numbness, weakness, urinary incontinence, urinary retention, bowel incontinence, saddle anesthesia. She also reports then, that there have been increase to his pain meds done, and that she might have stopped breathing. The pain meds were filled just y'day, although, they were prescribed in December. Pt's husband confirms that she almost passed out after taking her meds. She denies chest pain, palpitations prior to her near syncope episode.   Patient is a 48 y.o. female presenting with leg pain, back pain, and headaches. The history is provided by the patient.  Leg Pain Associated symptoms: back pain   Associated symptoms: no neck pain   Back Pain Associated symptoms: headaches and leg pain   Associated symptoms: no abdominal pain, no chest pain and no dysuria   Headache Associated symptoms: back pain   Associated symptoms: no abdominal pain, no nausea, no neck pain and no vomiting     Past Medical History  Diagnosis Date  . Abdominal pain   . Diabetes mellitus     states her doctor took her off all DM meds in past month  . COPD (chronic obstructive pulmonary disease)   . GERD (gastroesophageal reflux disease)   . Chronic nausea   . Hypothyroidism   . Mental  disorder     Bipolar and schizophrenic  . Depression   . Headache(784.0)   . Anxiety   . Atrial fibrillation 09/29/11    converted spontaneously  . Chronic pain   . Tobacco abuse   . HTN (hypertension)   . Accidental drug overdose April 2013  . Schizophrenia    Past Surgical History  Procedure Laterality Date  . Laparoscopic assisted vaginal hysterectomy  03/04/2011    Procedure: LAPAROSCOPIC ASSISTED VAGINAL HYSTERECTOMY;  Surgeon: Cyril Mourning, MD;  Location: [facility];  Service: Gynecology;  Laterality: N/A;  . Cystocele repair  03/04/2011    Procedure: ANTERIOR REPAIR (CYSTOCELE);  Surgeon: Reece Packer;  Location: [facility];  Service: Urology;  Laterality: N/A;  . Bladder suspension  03/04/2011    Procedure: [place] PROCEDURE;  Surgeon: Elayne Snare MacDiarmid;  Location: [facility];  Service: Urology;  Laterality: N/A;  . Cystoscopy  03/04/2011    Procedure: CYSTOSCOPY;  Surgeon: Elayne Snare MacDiarmid;  Location: [facility];  Service: Urology;  Laterality: N/A;   Family History  Problem Relation Age of Onset  . Heart attack Father     56s  . Heart attack Sister     70  . Diabetes Mother   . Heart disease Mother   . Hypertension Mother    History  Substance Use Topics  . Smoking status: Current Every Day Smoker -- 1.50 packs/day    Types: Cigarettes  . Smokeless tobacco: Not on file  .  Alcohol Use: No   OB History   Grav Para Term Preterm Abortions TAB SAB Ect Mult Living   3 3   0 0 0 0 0 3     Review of Systems  Constitutional: Positive for activity change.  Respiratory: Negative for shortness of breath.   Cardiovascular: Negative for chest pain.  Gastrointestinal: Negative for nausea, vomiting and abdominal pain.  Genitourinary: Negative for dysuria.  Musculoskeletal: Positive for back pain. Negative for neck pain.  Neurological: Positive for headaches. Negative for syncope.  All other systems reviewed and are negative.      Allergies  Aspirin  Home Medications    Current Outpatient Rx  Name  Route  Sig  Dispense  Refill  . albuterol (PROVENTIL HFA;VENTOLIN HFA) 108 (90 BASE) MCG/ACT inhaler   Inhalation   Inhale 2 puffs into the lungs every 6 (six) hours as needed. For wheezing         . clonazePAM (KLONOPIN) 1 MG tablet   Oral   Take 1 mg by mouth 3 (three) times daily as needed for anxiety.         Marland Kitchen diltiazem (CARDIZEM CD) 240 MG 24 hr capsule   Oral   Take 240 mg by mouth daily.         . DULoxetine (CYMBALTA) 60 MG capsule   Oral   Take 60 mg by mouth at bedtime.         . gabapentin (NEURONTIN) 300 MG capsule   Oral   Take 300 mg by mouth 3 (three) times daily.         Marland Kitchen ibuprofen (ADVIL,MOTRIN) 800 MG tablet   Oral   Take 800 mg by mouth 2 (two) times daily as needed for moderate pain.         Marland Kitchen levothyroxine (SYNTHROID, LEVOTHROID) 100 MCG tablet   Oral   Take 100 mcg by mouth daily before breakfast.         . Lurasidone HCl (LATUDA) 60 MG TABS   Oral   Take 1 tablet by mouth 2 (two) times daily.         . metFORMIN (GLUCOPHAGE) 500 MG tablet   Oral   Take 500 mg by mouth 2 (two) times daily with a meal.         . Multiple Vitamin (MULTIVITAMIN WITH MINERALS) TABS tablet   Oral   Take 1 tablet by mouth daily.         Marland Kitchen oxyCODONE-acetaminophen (PERCOCET) 10-325 MG per tablet   Oral   Take 1 tablet by mouth 3 (three) times daily as needed. For pain.         Marland Kitchen oxymorphone (OPANA ER) 40 MG 12 hr tablet   Oral   Take 40 mg by mouth every 8 (eight) hours. For back pain         . simvastatin (ZOCOR) 20 MG tablet   Oral   Take 20 mg by mouth daily.         Marland Kitchen tiotropium (SPIRIVA) 18 MCG inhalation capsule   Inhalation   Place 1 capsule (18 mcg total) into inhaler and inhale daily.   30 capsule   11   . traZODone (DESYREL) 100 MG tablet   Oral   Take 200 mg by mouth at bedtime.         . insulin aspart (NOVOLOG) 100 UNIT/ML injection   Subcutaneous   Inject 0-20 Units into the  skin See admin instructions. Less than 150  no insulin, greater than 150 in the morning inject 20 units, greater than 150 in the evening inject 10 units         . predniSONE (DELTASONE) 50 MG tablet   Oral   Take 1 tablet (50 mg total) by mouth daily.   5 tablet   0   . SUMATRIPTAN SUCCINATE IJ   Injection   Inject 0.5 mLs as directed as needed (for migraine).           BP 109/72  Pulse 83  Temp(Src) 98.6 F (37 C) (Oral)  Resp 18  SpO2 98%  LMP 01/08/2011 Physical Exam  Nursing note and vitals reviewed. Constitutional: She is oriented to person, place, and time. She appears well-developed and well-nourished.  HENT:  Head: Normocephalic and atraumatic.  Eyes: EOM are normal. Pupils are equal, round, and reactive to light.  Neck: Neck supple.  Cardiovascular: Normal rate, regular rhythm and normal heart sounds.   Pulmonary/Chest: Effort normal. No respiratory distress. She has wheezes.  Abdominal: Soft. She exhibits no distension. There is no tenderness. There is no rebound and no guarding.  Musculoskeletal:  Pt has tenderness over the lumbar region No step offs, no erythema. Pt has 2+ patellar reflex bilaterally. Able to discriminate between sharp and dull. Straight leg test is negative.  Neurological: She is alert and oriented to person, place, and time.  Skin: Skin is warm and dry.    ED Course  Procedures (including critical care time) Labs Review Labs Reviewed  CBC WITH DIFFERENTIAL - Abnormal; Notable for the following:    WBC 10.6 (*)    Lymphs Abs 4.4 (*)    All other components within normal limits  BASIC METABOLIC PANEL - Abnormal; Notable for the following:    Potassium 3.5 (*)    Glucose, Bld 167 (*)    All other components within normal limits  TROPONIN I  URINALYSIS, ROUTINE W REFLEX MICROSCOPIC   Imaging Review Dg Chest 2 View  09/04/2013   CLINICAL DATA:  Chest pain and short of breath  EXAM: CHEST  2 VIEW  COMPARISON:  06/15/2013  FINDINGS:  Limited exam due to technique  The lungs are clear without infiltrate or effusion. Negative for heart failure.  IMPRESSION: No active cardiopulmonary disease.   Electronically Signed   By: Franchot Gallo M.D.   On: 09/04/2013 06:26     EKG Interpretation None      MDM   Final diagnoses:  COPD exacerbation  Medication side effect    Pt comes in with back pain. Her exam shows + wheezing, and what appears to be flare up of her chronic back pain. Neuro exam is normal.  We gave her toradol, and her sx resolved.  Pt also has some wheezing, so we will give her meds for COPD exacerbation.  Finally, i think ? Questionable event of resp depression - could be related to the narcotic and sedatives that she just doubled up on. I have advised patient, and her husband, NOT to increase the dose, and instead see the their primary again for med reconciliation.   Smoking cessation instruction/counseling given:  counseled patient on the dangers of tobacco use, advised patient to stop smoking, and reviewed strategies to maximize success   Varney Biles, MD 09/05/13 [phone]

## 2013-12-09 DIAGNOSIS — M7918 Myalgia, other site: Secondary | ICD-10-CM | POA: Insufficient documentation

## 2014-01-25 ENCOUNTER — Encounter: Payer: Medicare Other | Attending: Internal Medicine | Admitting: Dietician

## 2014-01-25 ENCOUNTER — Encounter: Payer: Self-pay | Admitting: Dietician

## 2014-01-25 VITALS — Ht 62.0 in | Wt 202.5 lb

## 2014-01-25 DIAGNOSIS — E119 Type 2 diabetes mellitus without complications: Secondary | ICD-10-CM | POA: Diagnosis not present

## 2014-01-25 DIAGNOSIS — Z713 Dietary counseling and surveillance: Secondary | ICD-10-CM | POA: Diagnosis present

## 2014-01-25 DIAGNOSIS — I10 Essential (primary) hypertension: Secondary | ICD-10-CM | POA: Insufficient documentation

## 2014-01-25 NOTE — Patient Instructions (Signed)
Plan:  Aim for 2-3 Carb Choices per meal (30-45 grams) +/- 1 either way  Aim for 0-1 Carbs per snack if hungry Consider including protein with meals and snacks Consider reading food labels for Total Carbohydrate and Fat Grams of foods Consider baking foods instead of frying for cholesterol - try a canola oil cooking spray

## 2014-01-25 NOTE — Progress Notes (Signed)
  Medical Nutrition Therapy:  Appt start time: 1010 end time:  1110.   Assessment:  Primary concerns today: Diabetes & Hypertension.  Patient and I had limited time in the interaction, she needed to leave early to make another appointment. Patient's diet is high in saturated fat - she fries foods, uses butter and fat back regularly in food preparation. Patient eats carbohydrate portions in excess at meals - mostly bread and potatoes.   BS readings morning - about 150 - takes insulin if over 150.  Preferred Learning Style:   No preference indicated   Learning Readiness:   Contemplating  MEDICATIONS: See Chart   DIETARY INTAKE:  Usual eating pattern includes 2 meals and 1-2 snacks per day.   24-hr recall:  B ( AM): egg and cheese crossaint, hashbrowns and french toast  Snk ( AM):   L ( PM):  Snk ( PM): peanuts, cashews D ( PM): grilled ribeye steak or fried, battered pork chop with baked potato and roll Snk ( PM): Little Debbie Nutty Buddy bars - 2 packs Beverages: diet coke  Usual physical activity: did not get to discuss  Estimated energy needs: 1800 calories 200 g carbohydrates 135 g protein 50 g fat  Progress Towards Goal(s):  No progress.   Nutritional Diagnosis:  NB-1.1 Food and nutrition-related knowledge deficit As related to what foods affect blood sugar.  As evidenced by Patient report and elevated blood sugar - A1C 6.6.    Intervention:  Nutrition education and counseling.  Reviewed how food affects blood sugar, began carbohydrate counting, reviewed what an A1C is, normal pre and post-prandial bloods sugar ranges.   Plan:  Aim for 2-3 Carb Choices per meal (30-45 grams) +/- 1 either way  Aim for 0-1 Carbs per snack if hungry Consider including protein with meals and snacks Consider reading food labels for Total Carbohydrate and Fat Grams of foods Consider baking foods instead of frying for cholesterol - try a canola oil cooking spray   Teaching Method  Utilized:  Visual Auditory Hands on  Handouts given during visit include:  Yellow card   Barriers to learning/adherence to lifestyle change: none  Demonstrated degree of understanding via:  Teach Back   Monitoring/Evaluation:  Dietary intake, exercise, carbohydrate counting, and body weight in 4 week(s).

## 2014-02-04 ENCOUNTER — Encounter (HOSPITAL_COMMUNITY): Payer: Self-pay | Admitting: Emergency Medicine

## 2014-02-04 ENCOUNTER — Emergency Department (HOSPITAL_COMMUNITY)
Admission: EM | Admit: 2014-02-04 | Discharge: 2014-02-05 | Discharge status: Against Medical Advice | Payer: Medicare Other | Attending: Emergency Medicine | Admitting: Emergency Medicine

## 2014-02-04 DIAGNOSIS — Z87891 Personal history of nicotine dependence: Secondary | ICD-10-CM | POA: Diagnosis not present

## 2014-02-04 DIAGNOSIS — L299 Pruritus, unspecified: Secondary | ICD-10-CM | POA: Diagnosis not present

## 2014-02-04 DIAGNOSIS — J441 Chronic obstructive pulmonary disease with (acute) exacerbation: Secondary | ICD-10-CM | POA: Diagnosis not present

## 2014-02-04 DIAGNOSIS — I10 Essential (primary) hypertension: Secondary | ICD-10-CM | POA: Insufficient documentation

## 2014-02-04 DIAGNOSIS — E119 Type 2 diabetes mellitus without complications: Secondary | ICD-10-CM | POA: Insufficient documentation

## 2014-02-04 DIAGNOSIS — Z9981 Dependence on supplemental oxygen: Secondary | ICD-10-CM | POA: Insufficient documentation

## 2014-02-04 NOTE — ED Notes (Signed)
Pt c/o severe itching over entire body.  Denies any new soap, laundry detergent, medications or foods.  Pt took 50 mg of benadryl at home w/o relief.

## 2014-02-05 NOTE — ED Notes (Signed)
Patient walked out of treatment area to lobby @ 5-7 minutes ago. Patient is not seen in lobby or outside. Patient being discharged AMA at this time.

## 2014-02-05 NOTE — ED Notes (Addendum)
Patient states she is short of breath. Patient is able to speak in complete sentences without difficulty. Patient states she wears O2 at home, initially she states she wears it at night for sleeping, then changed her story stating she is supposed to wear it all the time. Patient was reminded if she is to be wearing O2 at all times she should be sure to carry it with her at all times. Patient SPO2 checked, found to be 100% on room air. Patient was advised this writer will not placed her on O2 at this time as she does not need it. Patient states she is going to call "them to bring me my Oxygen." Patient is no longer scratching.

## 2014-02-05 NOTE — ED Notes (Signed)
PA at bedside.

## 2014-02-15 ENCOUNTER — Ambulatory Visit: Payer: Self-pay | Admitting: Dietician

## 2014-03-04 ENCOUNTER — Encounter (HOSPITAL_COMMUNITY): Payer: Self-pay | Admitting: Emergency Medicine

## 2014-03-04 ENCOUNTER — Emergency Department (HOSPITAL_COMMUNITY)
Admission: EM | Admit: 2014-03-04 | Discharge: 2014-03-04 | Disposition: A | Discharge status: Home / Self Care | Payer: Worker's Compensation | Attending: Emergency Medicine | Admitting: Emergency Medicine

## 2014-03-04 DIAGNOSIS — E1142 Type 2 diabetes mellitus with diabetic polyneuropathy: Secondary | ICD-10-CM | POA: Diagnosis not present

## 2014-03-04 DIAGNOSIS — I10 Essential (primary) hypertension: Secondary | ICD-10-CM | POA: Diagnosis not present

## 2014-03-04 DIAGNOSIS — E039 Hypothyroidism, unspecified: Secondary | ICD-10-CM | POA: Diagnosis not present

## 2014-03-04 DIAGNOSIS — F319 Bipolar disorder, unspecified: Secondary | ICD-10-CM | POA: Insufficient documentation

## 2014-03-04 DIAGNOSIS — Z794 Long term (current) use of insulin: Secondary | ICD-10-CM | POA: Insufficient documentation

## 2014-03-04 DIAGNOSIS — K219 Gastro-esophageal reflux disease without esophagitis: Secondary | ICD-10-CM | POA: Diagnosis not present

## 2014-03-04 DIAGNOSIS — M79604 Pain in right leg: Secondary | ICD-10-CM

## 2014-03-04 DIAGNOSIS — G8928 Other chronic postprocedural pain: Secondary | ICD-10-CM | POA: Diagnosis not present

## 2014-03-04 DIAGNOSIS — M25569 Pain in unspecified knee: Secondary | ICD-10-CM | POA: Insufficient documentation

## 2014-03-04 DIAGNOSIS — Z87891 Personal history of nicotine dependence: Secondary | ICD-10-CM | POA: Diagnosis not present

## 2014-03-04 DIAGNOSIS — J449 Chronic obstructive pulmonary disease, unspecified: Secondary | ICD-10-CM | POA: Diagnosis not present

## 2014-03-04 DIAGNOSIS — F411 Generalized anxiety disorder: Secondary | ICD-10-CM | POA: Diagnosis not present

## 2014-03-04 DIAGNOSIS — E785 Hyperlipidemia, unspecified: Secondary | ICD-10-CM | POA: Diagnosis not present

## 2014-03-04 DIAGNOSIS — I4891 Unspecified atrial fibrillation: Secondary | ICD-10-CM | POA: Diagnosis not present

## 2014-03-04 DIAGNOSIS — E1149 Type 2 diabetes mellitus with other diabetic neurological complication: Secondary | ICD-10-CM | POA: Diagnosis not present

## 2014-03-04 DIAGNOSIS — G8929 Other chronic pain: Secondary | ICD-10-CM

## 2014-03-04 DIAGNOSIS — Z79899 Other long term (current) drug therapy: Secondary | ICD-10-CM | POA: Insufficient documentation

## 2014-03-04 DIAGNOSIS — J4489 Other specified chronic obstructive pulmonary disease: Secondary | ICD-10-CM | POA: Insufficient documentation

## 2014-03-04 HISTORY — DX: Type 2 diabetes mellitus with diabetic neuropathy, unspecified: E11.40

## 2014-03-04 HISTORY — DX: Other chronic pain: G89.29

## 2014-03-04 HISTORY — DX: Pain in unspecified knee: M25.569

## 2014-03-04 HISTORY — DX: Dorsalgia, unspecified: M54.9

## 2014-03-04 MED ORDER — HYDROMORPHONE HCL PF 1 MG/ML IJ SOLN
1.0000 mg | Freq: Once | INTRAMUSCULAR | Status: AC
  Administered 2014-03-04: 1 mg via INTRAMUSCULAR
  Filled 2014-03-04: qty 1

## 2014-03-04 NOTE — Discharge Instructions (Signed)

## 2014-03-04 NOTE — ED Notes (Signed)
48 yo female with post op right knee pain. Pt reports I/D and Ligament work on yesterday around 3:30pm. Pt reports pain 10/10 in right knee. +2 edema in right lower extremity and positive for pedal pulses. Alert oriented x3.

## 2014-03-04 NOTE — ED Provider Notes (Signed)
CSN: [id number]     Arrival date & time 03/04/14  0914 History   First MD Initiated Contact with Patient 03/04/14 0920     No chief complaint on file.    (Consider location/radiation/quality/duration/timing/severity/associated sxs/prior Treatment) HPI Comments: Pt states that she had orthoscopic knee surgery yesterday with Dr. Alvan Dame. Pt states that she is here for the pain. Pt states that she has chronic pain and takes opana and percocet and Dr. Alvan Dame changed her told her not to take the percocet and to take the vicodin. Pt states that she last had 2 vicodin 5 hours ago. She also had her opana this morning. Pt states that she hasn't changed the ice packs on the area. Denies swelling or redness the area. States that just can't get pain control  The history is provided by the patient. No language interpreter was used.    Past Medical History  Diagnosis Date  . Abdominal pain   . Diabetes mellitus     states her doctor took her off all DM meds in past month  . COPD (chronic obstructive pulmonary disease)   . GERD (gastroesophageal reflux disease)   . Chronic nausea   . Hypothyroidism   . Mental disorder     Bipolar and schizophrenic  . Depression   . Headache(784.0)   . Anxiety   . Atrial fibrillation 09/29/11    converted spontaneously  . Chronic pain   . Tobacco abuse   . HTN (hypertension)   . Accidental drug overdose April 2013  . Schizophrenia   . Hyperlipidemia   . Chronic back pain   . Chronic knee pain   . Diabetic neuropathy    Past Surgical History  Procedure Laterality Date  . Laparoscopic assisted vaginal hysterectomy  03/04/2011    Procedure: LAPAROSCOPIC ASSISTED VAGINAL HYSTERECTOMY;  Surgeon: Cyril Mourning, MD;  Location: [facility];  Service: Gynecology;  Laterality: N/A;  . Cystocele repair  03/04/2011    Procedure: ANTERIOR REPAIR (CYSTOCELE);  Surgeon: Reece Packer;  Location: [facility];  Service: Urology;  Laterality: N/A;  . Bladder suspension  03/04/2011     Procedure: [place] PROCEDURE;  Surgeon: Elayne Snare MacDiarmid;  Location: [facility];  Service: Urology;  Laterality: N/A;  . Cystoscopy  03/04/2011    Procedure: CYSTOSCOPY;  Surgeon: Elayne Snare MacDiarmid;  Location: [facility];  Service: Urology;  Laterality: N/A;  . Abdominal hysterectomy     Family History  Problem Relation Age of Onset  . Heart attack Father     40s  . Heart attack Sister     35  . Diabetes Mother   . Heart disease Mother   . Hypertension Mother   . COPD Other    History  Substance Use Topics  . Smoking status: Former Smoker -- 1.50 packs/day    Types: Cigarettes  . Smokeless tobacco: Not on file  . Alcohol Use: No   OB History   Grav Para Term Preterm Abortions TAB SAB Ect Mult Living   3 3   0 0 0 0 0 3     Review of Systems  Constitutional: Negative.   Respiratory: Negative.   Cardiovascular: Negative.       Allergies  Aspirin  Home Medications   Prior to Admission medications   Medication Sig Start Date End Date Taking? Authorizing Provider  albuterol (PROVENTIL HFA;VENTOLIN HFA) 108 (90 BASE) MCG/ACT inhaler Inhale 2 puffs into the lungs every 6 (six) hours as needed. For wheezing  Robbie Lis, MD  budesonide-formoterol [facility]) 160-4.5 MCG/ACT inhaler Inhale 2 puffs into the lungs 2 (two) times daily.    Historical Provider, MD  buPROPion (ZYBAN) 150 MG 12 hr tablet Take 150 mg by mouth 2 (two) times daily.    Historical Provider, MD  clonazePAM (KLONOPIN) 1 MG tablet Take 1 mg by mouth 3 (three) times daily as needed for anxiety.    Historical Provider, MD  diltiazem (CARDIZEM CD) 240 MG 24 hr capsule Take 240 mg by mouth daily.    Robbie Lis, MD  DULoxetine (CYMBALTA) 60 MG capsule Take 60 mg by mouth at bedtime.    Historical Provider, MD  gabapentin (NEURONTIN) 300 MG capsule Take 300 mg by mouth 3 (three) times daily.    Robbie Lis, MD  ibuprofen (ADVIL,MOTRIN) 800 MG tablet Take 800 mg by mouth 2 (two) times daily as needed for  moderate pain.    Historical Provider, MD  insulin aspart (NOVOLOG FLEXPEN) 100 UNIT/ML FlexPen Inject 10-40 Units into the skin 2 (two) times daily.     Historical Provider, MD  insulin aspart (NOVOLOG) 100 UNIT/ML injection Inject 0-20 Units into the skin See admin instructions. Less than 150 no insulin, greater than 150 in the morning inject 20 units, greater than 150 in the evening inject 10 units    Historical Provider, MD  levothyroxine (SYNTHROID, LEVOTHROID) 100 MCG tablet Take 100 mcg by mouth daily before breakfast.    Historical Provider, MD  Lurasidone HCl (LATUDA) 60 MG TABS Take 1 tablet by mouth 2 (two) times daily.    Historical Provider, MD  metFORMIN (GLUCOPHAGE) 500 MG tablet Take 500 mg by mouth 2 (two) times daily with a meal.    Historical Provider, MD  mirabegron ER (MYRBETRIQ) 25 MG TB24 tablet Take 25 mg by mouth daily.    Historical Provider, MD  Multiple Vitamin (MULTIVITAMIN WITH MINERALS) TABS tablet Take 1 tablet by mouth daily.    Historical Provider, MD  oxyCODONE-acetaminophen (PERCOCET) 10-325 MG per tablet Take 1 tablet by mouth 3 (three) times daily as needed. For pain.    Robbie Lis, MD  oxymorphone [facility] ER) 40 MG 12 hr tablet Take 40 mg by mouth every 8 (eight) hours. For back pain    Robbie Lis, MD  pantoprazole (PROTONIX) 40 MG tablet Take 40 mg by mouth daily.    Historical Provider, MD  simvastatin (ZOCOR) 20 MG tablet Take 20 mg by mouth daily.    Robbie Lis, MD  SUMATRIPTAN SUCCINATE IJ Inject 0.5 mLs as directed as needed (for migraine).     Historical Provider, MD  tiotropium (SPIRIVA) 18 MCG inhalation capsule Place 1 capsule (18 mcg total) into inhaler and inhale daily. 10/02/11   Robbie Lis, MD  traZODone (DESYREL) 100 MG tablet Take 200 mg by mouth at bedtime.    Robbie Lis, MD   BP 115/67  Pulse 119  Temp(Src) 99.1 F (37.3 C) (Oral)  Resp 18  SpO2 94%  LMP 01/08/2011 Physical Exam  Nursing note and vitals  reviewed. Constitutional: She is oriented to person, place, and time. She appears well-developed and well-nourished.  Cardiovascular: Normal rate and regular rhythm.   Pulmonary/Chest: Effort normal and breath sounds normal.  Musculoskeletal: Normal range of motion.  No gross abnormality noted for pt being one day post op  Neurological: She is alert and oriented to person, place, and time.  Skin:  Bruising  Noted to the right knee  and leg:no redness or warmth  Noted. Pulses intact    ED Course  Procedures (including critical care time) Labs Review Labs Reviewed - No data to display  Imaging Review No results found.   EKG Interpretation None      MDM   Final diagnoses:  Pain of right lower extremity  Chronic pain    Pt is feeling better at this time don't think any testing is needed at this time as likely pain being one day post op    Glendell Docker, NP 03/04/14 1047

## 2014-03-04 NOTE — ED Notes (Signed)
I gave the patient 2 small ice packs for her knee.

## 2014-03-08 NOTE — ED Provider Notes (Signed)
Medical screening examination/treatment/procedure(s) were performed by non-physician practitioner and as supervising physician I was immediately available for consultation/collaboration.   EKG Interpretation None        Francine Graven, DO 03/08/14 [id number]

## 2014-04-24 ENCOUNTER — Encounter (HOSPITAL_COMMUNITY): Payer: Self-pay | Admitting: Emergency Medicine

## 2014-06-25 DIAGNOSIS — J449 Chronic obstructive pulmonary disease, unspecified: Secondary | ICD-10-CM | POA: Diagnosis not present

## 2014-07-10 DIAGNOSIS — Z5181 Encounter for therapeutic drug level monitoring: Secondary | ICD-10-CM | POA: Insufficient documentation

## 2014-07-10 DIAGNOSIS — Z7689 Persons encountering health services in other specified circumstances: Secondary | ICD-10-CM | POA: Diagnosis not present

## 2014-07-10 DIAGNOSIS — M5440 Lumbago with sciatica, unspecified side: Secondary | ICD-10-CM | POA: Diagnosis not present

## 2014-07-10 DIAGNOSIS — E669 Obesity, unspecified: Secondary | ICD-10-CM | POA: Diagnosis not present

## 2014-07-10 DIAGNOSIS — G894 Chronic pain syndrome: Secondary | ICD-10-CM | POA: Diagnosis not present

## 2014-07-10 DIAGNOSIS — K5900 Constipation, unspecified: Secondary | ICD-10-CM | POA: Diagnosis not present

## 2014-07-13 DIAGNOSIS — J449 Chronic obstructive pulmonary disease, unspecified: Secondary | ICD-10-CM | POA: Diagnosis not present

## 2014-07-26 DIAGNOSIS — J449 Chronic obstructive pulmonary disease, unspecified: Secondary | ICD-10-CM | POA: Diagnosis not present

## 2014-08-13 DIAGNOSIS — J449 Chronic obstructive pulmonary disease, unspecified: Secondary | ICD-10-CM | POA: Diagnosis not present

## 2014-08-24 DIAGNOSIS — J449 Chronic obstructive pulmonary disease, unspecified: Secondary | ICD-10-CM | POA: Diagnosis not present

## 2014-09-08 DIAGNOSIS — F5101 Primary insomnia: Secondary | ICD-10-CM | POA: Insufficient documentation

## 2014-09-08 DIAGNOSIS — R29898 Other symptoms and signs involving the musculoskeletal system: Secondary | ICD-10-CM | POA: Diagnosis not present

## 2014-09-08 DIAGNOSIS — E669 Obesity, unspecified: Secondary | ICD-10-CM | POA: Diagnosis not present

## 2014-09-08 DIAGNOSIS — M5440 Lumbago with sciatica, unspecified side: Secondary | ICD-10-CM | POA: Diagnosis not present

## 2014-09-11 DIAGNOSIS — J449 Chronic obstructive pulmonary disease, unspecified: Secondary | ICD-10-CM | POA: Diagnosis not present

## 2014-09-24 DIAGNOSIS — J449 Chronic obstructive pulmonary disease, unspecified: Secondary | ICD-10-CM | POA: Diagnosis not present

## 2014-10-12 DIAGNOSIS — J449 Chronic obstructive pulmonary disease, unspecified: Secondary | ICD-10-CM | POA: Diagnosis not present

## 2014-10-17 DIAGNOSIS — J42 Unspecified chronic bronchitis: Secondary | ICD-10-CM | POA: Diagnosis not present

## 2014-10-17 DIAGNOSIS — I10 Essential (primary) hypertension: Secondary | ICD-10-CM | POA: Diagnosis not present

## 2014-10-17 DIAGNOSIS — E039 Hypothyroidism, unspecified: Secondary | ICD-10-CM | POA: Diagnosis not present

## 2014-10-17 DIAGNOSIS — K219 Gastro-esophageal reflux disease without esophagitis: Secondary | ICD-10-CM | POA: Diagnosis not present

## 2014-10-17 DIAGNOSIS — F319 Bipolar disorder, unspecified: Secondary | ICD-10-CM | POA: Diagnosis not present

## 2014-10-24 DIAGNOSIS — J449 Chronic obstructive pulmonary disease, unspecified: Secondary | ICD-10-CM | POA: Diagnosis not present

## 2014-11-04 ENCOUNTER — Emergency Department (HOSPITAL_COMMUNITY)
Admission: EM | Admit: 2014-11-04 | Discharge: 2014-11-04 | Disposition: A | Discharge status: Home / Self Care | Payer: Medicare Other | Attending: Emergency Medicine | Admitting: Emergency Medicine

## 2014-11-04 ENCOUNTER — Encounter (HOSPITAL_COMMUNITY): Payer: Self-pay | Admitting: *Deleted

## 2014-11-04 ENCOUNTER — Emergency Department (HOSPITAL_COMMUNITY): Payer: Medicare Other

## 2014-11-04 DIAGNOSIS — I10 Essential (primary) hypertension: Secondary | ICD-10-CM | POA: Insufficient documentation

## 2014-11-04 DIAGNOSIS — K219 Gastro-esophageal reflux disease without esophagitis: Secondary | ICD-10-CM | POA: Diagnosis not present

## 2014-11-04 DIAGNOSIS — E039 Hypothyroidism, unspecified: Secondary | ICD-10-CM | POA: Diagnosis not present

## 2014-11-04 DIAGNOSIS — F329 Major depressive disorder, single episode, unspecified: Secondary | ICD-10-CM | POA: Diagnosis not present

## 2014-11-04 DIAGNOSIS — E1140 Type 2 diabetes mellitus with diabetic neuropathy, unspecified: Secondary | ICD-10-CM | POA: Insufficient documentation

## 2014-11-04 DIAGNOSIS — G8929 Other chronic pain: Secondary | ICD-10-CM | POA: Insufficient documentation

## 2014-11-04 DIAGNOSIS — M25561 Pain in right knee: Secondary | ICD-10-CM | POA: Diagnosis present

## 2014-11-04 DIAGNOSIS — M1711 Unilateral primary osteoarthritis, right knee: Secondary | ICD-10-CM | POA: Diagnosis not present

## 2014-11-04 DIAGNOSIS — J449 Chronic obstructive pulmonary disease, unspecified: Secondary | ICD-10-CM | POA: Diagnosis not present

## 2014-11-04 DIAGNOSIS — R110 Nausea: Secondary | ICD-10-CM | POA: Diagnosis not present

## 2014-11-04 DIAGNOSIS — F419 Anxiety disorder, unspecified: Secondary | ICD-10-CM | POA: Diagnosis not present

## 2014-11-04 DIAGNOSIS — Z79899 Other long term (current) drug therapy: Secondary | ICD-10-CM | POA: Diagnosis not present

## 2014-11-04 DIAGNOSIS — E785 Hyperlipidemia, unspecified: Secondary | ICD-10-CM | POA: Insufficient documentation

## 2014-11-04 DIAGNOSIS — F209 Schizophrenia, unspecified: Secondary | ICD-10-CM | POA: Diagnosis not present

## 2014-11-04 DIAGNOSIS — Z794 Long term (current) use of insulin: Secondary | ICD-10-CM | POA: Diagnosis not present

## 2014-11-04 DIAGNOSIS — Z87891 Personal history of nicotine dependence: Secondary | ICD-10-CM | POA: Diagnosis not present

## 2014-11-04 MED ORDER — CYCLOBENZAPRINE HCL 10 MG PO TABS: 10.0000 mg | ORAL_TABLET | Freq: Two times a day (BID) | ORAL | Status: DC | PRN

## 2014-11-04 NOTE — ED Provider Notes (Signed)
CSN: [id number]     Arrival date & time 11/04/14  0902 History  This chart was scribed for non-physician practitioner, Noland Fordyce, PA-C working with No att. providers found, by Erling Conte, ED Scribe. This patient was seen in room TR07C/TR07C and the patient's care was started at 9:08 PM     Chief Complaint  Patient presents with  . Knee Pain    The history is provided by the patient. No language interpreter was used.    HPI Comments: [name] is a 49 y.o. female who presents to the Emergency Department complaining of constant, 9/10 right knee pain for 10 days. She reports the pain is radiating down the lower part of her leg. She is having associated mild nausea. Pt states she had surgery on her right knee in August 2015 that was done by Dr.Olin. She states she had an appt with him on May 4th and was given a nerve injection. Pt notes the injection was made to the inner area of her right knee cap. Pt reports the pain began at 7:00 PM, the day she had the injection. She notes she has been having pain ever since. She states she has a f/u appt on May 18th. She has been taking 10 mg Percocet as well as gabapentin and an "ibuprofen-like" medication with no relief. Pt denies any fall or injury. She denies any fever or vomiting.  Past Medical History  Diagnosis Date  . Abdominal pain   . Diabetes mellitus     states her doctor took her off all DM meds in past month  . COPD (chronic obstructive pulmonary disease)   . GERD (gastroesophageal reflux disease)   . Chronic nausea   . Hypothyroidism   . Mental disorder     Bipolar and schizophrenic  . Depression   . Headache(784.0)   . Anxiety   . Atrial fibrillation 09/29/11    converted spontaneously  . Chronic pain   . Tobacco abuse   . HTN (hypertension)   . Accidental drug overdose April 2013  . Schizophrenia   . Hyperlipidemia   . Chronic back pain   . Chronic knee pain   . Diabetic neuropathy    Past Surgical History   Procedure Laterality Date  . Laparoscopic assisted vaginal hysterectomy  03/04/2011    Procedure: LAPAROSCOPIC ASSISTED VAGINAL HYSTERECTOMY;  Surgeon: Cyril Mourning, MD;  Location: [facility];  Service: Gynecology;  Laterality: N/A;  . Cystocele repair  03/04/2011    Procedure: ANTERIOR REPAIR (CYSTOCELE);  Surgeon: Reece Packer;  Location: [facility];  Service: Urology;  Laterality: N/A;  . Bladder suspension  03/04/2011    Procedure: [place] PROCEDURE;  Surgeon: Elayne Snare MacDiarmid;  Location: [facility];  Service: Urology;  Laterality: N/A;  . Cystoscopy  03/04/2011    Procedure: CYSTOSCOPY;  Surgeon: Elayne Snare MacDiarmid;  Location: [facility];  Service: Urology;  Laterality: N/A;  . Abdominal hysterectomy     Family History  Problem Relation Age of Onset  . Heart attack Father     39s  . Heart attack Sister     72  . Diabetes Mother   . Heart disease Mother   . Hypertension Mother   . COPD Other    History  Substance Use Topics  . Smoking status: Former Smoker -- 1.50 packs/day    Types: Cigarettes  . Smokeless tobacco: Not on file  . Alcohol Use: No   OB History    Gravida Para Term Preterm  AB TAB SAB Ectopic Multiple Living   3 3   0 0 0 0 0 3     Review of Systems  Constitutional: Negative for fever.  Gastrointestinal: Positive for nausea. Negative for vomiting.  Musculoskeletal: Positive for arthralgias (right knee). Negative for joint swelling.  Skin: Negative for color change and rash.  All other systems reviewed and are negative.     Allergies  Aspirin  Home Medications   Prior to Admission medications   Medication Sig Start Date End Date Taking? Authorizing Provider  albuterol (PROVENTIL HFA;VENTOLIN HFA) 108 (90 BASE) MCG/ACT inhaler Inhale 2 puffs into the lungs every 6 (six) hours as needed. For wheezing    Robbie Lis, MD  budesonide-formoterol [facility]) 160-4.5 MCG/ACT inhaler Inhale 2 puffs into the lungs 2 (two) times daily.    Historical Provider, MD   buPROPion (ZYBAN) 150 MG 12 hr tablet Take 150 mg by mouth 2 (two) times daily.    Historical Provider, MD  clonazePAM (KLONOPIN) 1 MG tablet Take 1 mg by mouth 3 (three) times daily as needed for anxiety.    Historical Provider, MD  cyclobenzaprine (FLEXERIL) 10 MG tablet Take 1 tablet (10 mg total) by mouth 2 (two) times daily as needed for muscle spasms. 11/04/14   Noland Fordyce, PA-C  diltiazem (CARDIZEM CD) 240 MG 24 hr capsule Take 240 mg by mouth daily.    Robbie Lis, MD  DULoxetine (CYMBALTA) 60 MG capsule Take 60 mg by mouth at bedtime.    Historical Provider, MD  gabapentin (NEURONTIN) 300 MG capsule Take 300 mg by mouth 3 (three) times daily.    Robbie Lis, MD  ibuprofen (ADVIL,MOTRIN) 800 MG tablet Take 800 mg by mouth 2 (two) times daily as needed for moderate pain.    Historical Provider, MD  insulin aspart (NOVOLOG FLEXPEN) 100 UNIT/ML FlexPen Inject 10-40 Units into the skin 2 (two) times daily.     Historical Provider, MD  insulin aspart (NOVOLOG) 100 UNIT/ML injection Inject 0-20 Units into the skin See admin instructions. Less than 150 no insulin, greater than 150 in the morning inject 20 units, greater than 150 in the evening inject 10 units    Historical Provider, MD  levothyroxine (SYNTHROID, LEVOTHROID) 100 MCG tablet Take 100 mcg by mouth daily before breakfast.    Historical Provider, MD  Lurasidone HCl (LATUDA) 60 MG TABS Take 1 tablet by mouth 2 (two) times daily.    Historical Provider, MD  metFORMIN (GLUCOPHAGE) 500 MG tablet Take 500 mg by mouth 2 (two) times daily with a meal.    Historical Provider, MD  mirabegron ER (MYRBETRIQ) 25 MG TB24 tablet Take 25 mg by mouth daily.    Historical Provider, MD  Multiple Vitamin (MULTIVITAMIN WITH MINERALS) TABS tablet Take 1 tablet by mouth daily.    Historical Provider, MD  oxyCODONE-acetaminophen (PERCOCET) 10-325 MG per tablet Take 1 tablet by mouth 3 (three) times daily as needed. For pain.    Robbie Lis, MD   oxymorphone [facility] ER) 40 MG 12 hr tablet Take 40 mg by mouth every 8 (eight) hours. For back pain    Robbie Lis, MD  pantoprazole (PROTONIX) 40 MG tablet Take 40 mg by mouth daily.    Historical Provider, MD  simvastatin (ZOCOR) 20 MG tablet Take 20 mg by mouth daily.    Robbie Lis, MD  SUMATRIPTAN SUCCINATE IJ Inject 0.5 mLs as directed as needed (for migraine).     Historical  Provider, MD  tiotropium (SPIRIVA) 18 MCG inhalation capsule Place 1 capsule (18 mcg total) into inhaler and inhale daily. 10/02/11   Robbie Lis, MD  traZODone (DESYREL) 100 MG tablet Take 200 mg by mouth at bedtime.    Robbie Lis, MD   Triage Vitals: BP 121/71 mmHg  Pulse 95  Temp(Src) 98.8 F (37.1 C) (Oral)  Resp 18  SpO2 95%  LMP 01/08/2011  Physical Exam  Constitutional: She appears well-developed and well-nourished. No distress.  HENT:  Head: Normocephalic and atraumatic.  Eyes: Conjunctivae are normal. No scleral icterus.  Neck: Normal range of motion.  Cardiovascular: Normal rate, regular rhythm and normal heart sounds.   Pulmonary/Chest: Effort normal and breath sounds normal. No respiratory distress. She has no wheezes. She has no rales. She exhibits no tenderness.  Abdominal: Soft. Bowel sounds are normal. She exhibits no distension and no mass. There is no tenderness. There is no rebound and no guarding.  Musculoskeletal: Normal range of motion.  Right knee: no obvious deformity or edema. tenderness medial joint space. Full ROM of right knee. No calf tenderness (see skin exam)  Neurological: She is alert.  Skin: Skin is warm and dry. She is not diaphoretic. No erythema.  Right Knee: no erythema or ecchymosis, weal healing injection site over medial joint space  Nursing note and vitals reviewed.   ED Course  Procedures (including critical care time)  DIAGNOSTIC STUDIES: Oxygen Saturation is 95% on RA, normal by my interpretation.    COORDINATION OF CARE: 9:15 PM- Will order x-ray  of right knee. Pt advised of plan for treatment and pt agrees.      Labs Review Labs Reviewed - No data to display  Imaging Review Dg Knee Complete 4 Views Right  11/04/2014   CLINICAL DATA:  Right knee pain for 10 days. History of right knee injury and surgery (01/2014). No recent injury to the right knee.  EXAM: RIGHT KNEE - COMPLETE 4+ VIEW  COMPARISON:  07/27/2013  FINDINGS: No fracture or dislocation. Moderate tricompartmental degenerative change of the knee, worse within the lateral compartment with joint space loss, subchondral sclerosis and osteophytosis. There is minimal spurring of the tibial spines. No evidence of chondrocalcinosis. No joint effusion. Several dermal calcifications overlie the anterior aspect of the upper calf. Regional soft tissues appear otherwise normal. No radiopaque foreign body.  IMPRESSION: 1. No acute findings. 2. Moderate tricompartmental degenerative change of the knee, worse within the lateral compartment.   Electronically Signed   By: Sandi Mariscal M.D.   On: 11/04/2014 10:18     EKG Interpretation None      MDM   Final diagnoses:  Right knee pain  Primary osteoarthritis of right knee    Pt presenting to ED with exacerbation of Right knee pain after receiving a joint injection by Dr. Alvan Dame, orthopedics on 5/4.  On exam, pt is tender along medial joint space line.  No evidence of underlying infect. Not concerned for septic joint. Pt denies new injuries or falls, however, will get plain films to check for joint effusion.  Plain films: no acute findings. Moderate tricompartmental degenerative change of knee, worse in Lateral compartment.   Pt states she is already taking gabapentin and 10mg  percocet w/o relief. Pt also reports taking an ibuprofen-like medication w/o relief.  Discussed with pt she is on strongest pain medication that would be prescribed for the ED. Will add flexeril and provide pt with knee brace.  Advised pt to f/u with  Dr. Alvan Dame in 4  days as previously prescribed.      I personally performed the services described in this documentation, which was scribed in my presence. The recorded information has been reviewed and is accurate.    Noland Fordyce, PA-C 11/04/14 [place], MD 11/04/14 1218

## 2014-11-04 NOTE — ED Notes (Signed)
Pt reports having nerve block to right knee on 5/4 and having severe pain since it wore off.

## 2014-11-07 ENCOUNTER — Emergency Department (HOSPITAL_COMMUNITY)
Admission: EM | Admit: 2014-11-07 | Discharge: 2014-11-07 | Disposition: A | Discharge status: Home / Self Care | Payer: Medicare Other | Attending: Emergency Medicine | Admitting: Emergency Medicine

## 2014-11-07 ENCOUNTER — Encounter (HOSPITAL_COMMUNITY): Payer: Self-pay

## 2014-11-07 DIAGNOSIS — Z87891 Personal history of nicotine dependence: Secondary | ICD-10-CM | POA: Insufficient documentation

## 2014-11-07 DIAGNOSIS — I4891 Unspecified atrial fibrillation: Secondary | ICD-10-CM | POA: Diagnosis not present

## 2014-11-07 DIAGNOSIS — E785 Hyperlipidemia, unspecified: Secondary | ICD-10-CM | POA: Insufficient documentation

## 2014-11-07 DIAGNOSIS — J449 Chronic obstructive pulmonary disease, unspecified: Secondary | ICD-10-CM | POA: Insufficient documentation

## 2014-11-07 DIAGNOSIS — E1140 Type 2 diabetes mellitus with diabetic neuropathy, unspecified: Secondary | ICD-10-CM | POA: Diagnosis not present

## 2014-11-07 DIAGNOSIS — Z7951 Long term (current) use of inhaled steroids: Secondary | ICD-10-CM | POA: Insufficient documentation

## 2014-11-07 DIAGNOSIS — Z79899 Other long term (current) drug therapy: Secondary | ICD-10-CM | POA: Diagnosis not present

## 2014-11-07 DIAGNOSIS — Z791 Long term (current) use of non-steroidal anti-inflammatories (NSAID): Secondary | ICD-10-CM | POA: Diagnosis not present

## 2014-11-07 DIAGNOSIS — K219 Gastro-esophageal reflux disease without esophagitis: Secondary | ICD-10-CM | POA: Insufficient documentation

## 2014-11-07 DIAGNOSIS — Z87828 Personal history of other (healed) physical injury and trauma: Secondary | ICD-10-CM | POA: Insufficient documentation

## 2014-11-07 DIAGNOSIS — M25561 Pain in right knee: Secondary | ICD-10-CM | POA: Diagnosis not present

## 2014-11-07 DIAGNOSIS — I10 Essential (primary) hypertension: Secondary | ICD-10-CM | POA: Diagnosis not present

## 2014-11-07 DIAGNOSIS — G8929 Other chronic pain: Secondary | ICD-10-CM | POA: Diagnosis not present

## 2014-11-07 DIAGNOSIS — F319 Bipolar disorder, unspecified: Secondary | ICD-10-CM | POA: Insufficient documentation

## 2014-11-07 DIAGNOSIS — E039 Hypothyroidism, unspecified: Secondary | ICD-10-CM | POA: Insufficient documentation

## 2014-11-07 DIAGNOSIS — F419 Anxiety disorder, unspecified: Secondary | ICD-10-CM | POA: Diagnosis not present

## 2014-11-07 MED ORDER — OXYCODONE-ACETAMINOPHEN 10-325 MG PO TABS: 1.0000 | ORAL_TABLET | ORAL | Status: DC | PRN

## 2014-11-07 MED ORDER — OXYCODONE-ACETAMINOPHEN 5-325 MG PO TABS
2.0000 | ORAL_TABLET | Freq: Once | ORAL | Status: AC
  Administered 2014-11-07: 2 via ORAL
  Filled 2014-11-07: qty 2

## 2014-11-07 NOTE — Discharge Instructions (Signed)
Heat Therapy Heat therapy can help ease sore, stiff, injured, and tight muscles and joints. Heat relaxes your muscles, which may help ease your pain.  RISKS AND COMPLICATIONS If you have any of the following conditions, do not use heat therapy unless your health care provider has approved:  Poor circulation.  Healing wounds or scarred skin in the area being treated.  Diabetes, heart disease, or high blood pressure.  Not being able to feel (numbness) the area being treated.  Unusual swelling of the area being treated.  Active infections.  Blood clots.  Cancer.  Inability to communicate pain. This may include young children and people who have problems with their brain function (dementia).  Pregnancy. Heat therapy should only be used on old, pre-existing, or long-lasting (chronic) injuries. Do not use heat therapy on new injuries unless directed by your health care provider. HOW TO USE HEAT THERAPY There are several different kinds of heat therapy, including:  Moist heat pack.  Warm water bath.  Hot water bottle.  Electric heating pad.  Heated gel pack.  Heated wrap.  Electric heating pad. Use the heat therapy method suggested by your health care provider. Follow your health care provider's instructions on when and how to use heat therapy. GENERAL HEAT THERAPY RECOMMENDATIONS  Do not sleep while using heat therapy. Only use heat therapy while you are awake.  Your skin may turn pink while using heat therapy. Do not use heat therapy if your skin turns red.  Do not use heat therapy if you have new pain.  High heat or long exposure to heat can cause burns. Be careful when using heat therapy to avoid burning your skin.  Do not use heat therapy on areas of your skin that are already irritated, such as with a rash or sunburn. SEEK MEDICAL CARE IF:  You have blisters, redness, swelling, or numbness.  You have new pain.  Your pain is worse. MAKE SURE  YOU:  Understand these instructions.  Will watch your condition.  Will get help right away if you are not doing well or get worse. Document Released: 09/01/2011 Document Revised: 10/24/2013 Document Reviewed: 08/02/2013 [facility] Patient Information 2015 [place], [place]. This information is not intended to replace advice given to you by your health care provider. Make sure you discuss any questions you have with your health care provider. Knee Pain The knee is the complex joint between your thigh and your lower leg. It is made up of bones, tendons, ligaments, and cartilage. The bones that make up the knee are:  The femur in the thigh.  The tibia and fibula in the lower leg.  The patella or kneecap riding in the groove on the lower femur. CAUSES  Knee pain is a common complaint with many causes. A few of these causes are:  Injury, such as:  A ruptured ligament or tendon injury.  Torn cartilage.  Medical conditions, such as:  Gout  Arthritis  Infections  Overuse, over training, or overdoing a physical activity. Knee pain can be minor or severe. Knee pain can accompany debilitating injury. Minor knee problems often respond well to self-care measures or get well on their own. More serious injuries may need medical intervention or even surgery. SYMPTOMS The knee is complex. Symptoms of knee problems can vary widely. Some of the problems are:  Pain with movement and weight bearing.  Swelling and tenderness.  Buckling of the knee.  Inability to straighten or extend your knee.  Your knee locks and you  cannot straighten it.  Warmth and redness with pain and fever.  Deformity or dislocation of the kneecap. DIAGNOSIS  Determining what is wrong may be very straight forward such as when there is an injury. It can also be challenging because of the complexity of the knee. Tests to make a diagnosis may include:  Your caregiver taking a history and doing a physical  exam.  Routine X-rays can be used to rule out other problems. X-rays will not reveal a cartilage tear. Some injuries of the knee can be diagnosed by:  Arthroscopy a surgical technique by which a small video camera is inserted through tiny incisions on the sides of the knee. This procedure is used to examine and repair internal knee joint problems. Tiny instruments can be used during arthroscopy to repair the torn knee cartilage (meniscus).  Arthrography is a radiology technique. A contrast liquid is directly injected into the knee joint. Internal structures of the knee joint then become visible on X-ray film.  An MRI scan is a non X-ray radiology procedure in which magnetic fields and a computer produce two- or three-dimensional images of the inside of the knee. Cartilage tears are often visible using an MRI scanner. MRI scans have largely replaced arthrography in diagnosing cartilage tears of the knee.  Blood work.  Examination of the fluid that helps to lubricate the knee joint (synovial fluid). This is done by taking a sample out using a needle and a syringe. TREATMENT The treatment of knee problems depends on the cause. Some of these treatments are:  Depending on the injury, proper casting, splinting, surgery, or physical therapy care will be needed.  Give yourself adequate recovery time. Do not overuse your joints. If you begin to get sore during workout routines, back off. Slow down or do fewer repetitions.  For repetitive activities such as cycling or running, maintain your strength and nutrition.  Alternate muscle groups. For example, if you are a weight lifter, work the upper body on one day and the lower body the next.  Either tight or weak muscles do not give the proper support for your knee. Tight or weak muscles do not absorb the stress placed on the knee joint. Keep the muscles surrounding the knee strong.  Take care of mechanical problems.  If you have flat feet, orthotics  or special shoes may help. See your caregiver if you need help.  Arch supports, sometimes with wedges on the inner or outer aspect of the heel, can help. These can shift pressure away from the side of the knee most bothered by osteoarthritis.  A brace called an "unloader" brace also may be used to help ease the pressure on the most arthritic side of the knee.  If your caregiver has prescribed crutches, braces, wraps or ice, use as directed. The acronym for this is PRICE. This means protection, rest, ice, compression, and elevation.  Nonsteroidal anti-inflammatory drugs (NSAIDs), can help relieve pain. But if taken immediately after an injury, they may actually increase swelling. Take NSAIDs with food in your stomach. Stop them if you develop stomach problems. Do not take these if you have a history of ulcers, stomach pain, or bleeding from the bowel. Do not take without your caregiver's approval if you have problems with fluid retention, heart failure, or kidney problems.  For ongoing knee problems, physical therapy may be helpful.  Glucosamine and chondroitin are over-the-counter dietary supplements. Both may help relieve the pain of osteoarthritis in the knee. These medicines are different  from the usual anti-inflammatory drugs. Glucosamine may decrease the rate of cartilage destruction.  Injections of a corticosteroid drug into your knee joint may help reduce the symptoms of an arthritis flare-up. They may provide pain relief that lasts a few months. You may have to wait a few months between injections. The injections do have a small increased risk of infection, water retention, and elevated blood sugar levels.  Hyaluronic acid injected into damaged joints may ease pain and provide lubrication. These injections may work by reducing inflammation. A series of shots may give relief for as long as 6 months.  Topical painkillers. Applying certain ointments to your skin may help relieve the pain and  stiffness of osteoarthritis. Ask your pharmacist for suggestions. Many over the-counter products are approved for temporary relief of arthritis pain.  In some countries, doctors often prescribe topical NSAIDs for relief of chronic conditions such as arthritis and tendinitis. A review of treatment with NSAID creams found that they worked as well as oral medications but without the serious side effects. PREVENTION  Maintain a healthy weight. Extra pounds put more strain on your joints.  Get strong, stay limber. Weak muscles are a common cause of knee injuries. Stretching is important. Include flexibility exercises in your workouts.  Be smart about exercise. If you have osteoarthritis, chronic knee pain or recurring injuries, you may need to change the way you exercise. This does not mean you have to stop being active. If your knees ache after jogging or playing basketball, consider switching to swimming, water aerobics, or other low-impact activities, at least for a few days a week. Sometimes limiting high-impact activities will provide relief.  Make sure your shoes fit well. Choose footwear that is right for your sport.  Protect your knees. Use the proper gear for knee-sensitive activities. Use kneepads when playing volleyball or laying carpet. Buckle your seat belt every time you drive. Most shattered kneecaps occur in car accidents.  Rest when you are tired. SEEK MEDICAL CARE IF:  You have knee pain that is continual and does not seem to be getting better.  SEEK IMMEDIATE MEDICAL CARE IF:  Your knee joint feels hot to the touch and you have a high fever. MAKE SURE YOU:   Understand these instructions.  Will watch your condition.  Will get help right away if you are not doing well or get worse. Document Released: 04/06/2007 Document Revised: 09/01/2011 Document Reviewed: 04/06/2007 [facility] Patient Information 2015 [place], [place]. This information is not intended to replace advice given  to you by your health care provider. Make sure you discuss any questions you have with your health care provider.

## 2014-11-07 NOTE — ED Notes (Signed)
Patient is alert and oriented x3.  She was given DC instructions and follow up visit instructions.  Patient gave verbal understanding. She was DC ambulatory under her own power to home.  V/S stable.  He was not showing any signs of distress on DC 

## 2014-11-07 NOTE — ED Provider Notes (Signed)
CSN: [id number]     Arrival date & time 11/07/14  0139 History   First MD Initiated Contact with Patient 11/07/14 [phone]     Chief Complaint  Patient presents with  . Leg Pain     (Consider location/radiation/quality/duration/timing/severity/associated sxs/prior Treatment) Patient is a 49 y.o. female presenting with leg pain. The history is provided by the patient. No language interpreter was used.  Leg Pain Location:  Knee Associated symptoms: no fever   Associated symptoms comment:  Patient with a history of right knee pain followed by Dr. Alvan Dame with arthroscopy followed by "nerve block" injection, presents with severe knee pain without known injury. She has been taking Flexeril and ibuprofen without control of the pain. She has an appointment with Dr. Alvan Dame in one week.    Past Medical History  Diagnosis Date  . Abdominal pain   . Diabetes mellitus     states her doctor took her off all DM meds in past month  . COPD (chronic obstructive pulmonary disease)   . GERD (gastroesophageal reflux disease)   . Chronic nausea   . Hypothyroidism   . Mental disorder     Bipolar and schizophrenic  . Depression   . Headache(784.0)   . Anxiety   . Atrial fibrillation 09/29/11    converted spontaneously  . Chronic pain   . Tobacco abuse   . HTN (hypertension)   . Accidental drug overdose April 2013  . Schizophrenia   . Hyperlipidemia   . Chronic back pain   . Chronic knee pain   . Diabetic neuropathy    Past Surgical History  Procedure Laterality Date  . Laparoscopic assisted vaginal hysterectomy  03/04/2011    Procedure: LAPAROSCOPIC ASSISTED VAGINAL HYSTERECTOMY;  Surgeon: Cyril Mourning, MD;  Location: [facility];  Service: Gynecology;  Laterality: N/A;  . Cystocele repair  03/04/2011    Procedure: ANTERIOR REPAIR (CYSTOCELE);  Surgeon: Reece Packer;  Location: [facility];  Service: Urology;  Laterality: N/A;  . Bladder suspension  03/04/2011    Procedure: [place] PROCEDURE;  Surgeon:  Elayne Snare MacDiarmid;  Location: [facility];  Service: Urology;  Laterality: N/A;  . Cystoscopy  03/04/2011    Procedure: CYSTOSCOPY;  Surgeon: Elayne Snare MacDiarmid;  Location: [facility];  Service: Urology;  Laterality: N/A;  . Abdominal hysterectomy     Family History  Problem Relation Age of Onset  . Heart attack Father     80s  . Heart attack Sister     67  . Diabetes Mother   . Heart disease Mother   . Hypertension Mother   . COPD Other    History  Substance Use Topics  . Smoking status: Former Smoker -- 1.50 packs/day    Types: Cigarettes  . Smokeless tobacco: Not on file  . Alcohol Use: No   OB History    Gravida Para Term Preterm AB TAB SAB Ectopic Multiple Living   3 3   0 0 0 0 0 3     Review of Systems  Constitutional: Negative for fever and chills.  HENT: Negative.   Respiratory: Negative.   Cardiovascular: Negative.   Gastrointestinal: Negative.   Musculoskeletal:       See HPI.  Skin: Negative.  Negative for color change and wound.  Neurological: Negative.  Negative for numbness.      Allergies  Aspirin  Home Medications   Prior to Admission medications   Medication Sig Start Date End Date Taking? Authorizing Provider  budesonide-formoterol [facility])  160-4.5 MCG/ACT inhaler Inhale 2 puffs into the lungs 2 (two) times daily.   Yes Historical Provider, MD  cyclobenzaprine (FLEXERIL) 10 MG tablet Take 1 tablet (10 mg total) by mouth 2 (two) times daily as needed for muscle spasms. 11/04/14  Yes Noland Fordyce, PA-C  diclofenac (VOLTAREN) 75 MG EC tablet Take 75 mg by mouth 2 (two) times daily with a meal. 10/26/14  Yes Historical Provider, MD  diltiazem (CARDIZEM CD) 240 MG 24 hr capsule Take 240 mg by mouth daily.   Yes Robbie Lis, MD  DULoxetine (CYMBALTA) 60 MG capsule Take 60 mg by mouth at bedtime.   Yes Historical Provider, MD  fluorometholone (FML) 0.1 % ophthalmic suspension Place 1 drop into both eyes 2 (two) times daily as needed (eye irritation).   08/15/14  Yes Historical Provider, MD  gabapentin (NEURONTIN) 400 MG capsule Take 400 mg by mouth 3 (three) times daily. 11/06/14  Yes Historical Provider, MD  gabapentin (NEURONTIN) 600 MG tablet Take 600 mg by mouth at bedtime.  11/07/14  Yes Historical Provider, MD  HUMALOG MIX 75/25 KWIKPEN (75-25) 100 UNIT/ML Kwikpen Inject 30-40 Units into the skin 2 (two) times daily. 40 units in the morning and 30 units in the evening 10/25/14  Yes Historical Provider, MD  hydrOXYzine (ATARAX/VISTARIL) 25 MG tablet Take 25 mg by mouth 3 (three) times daily as needed for anxiety.  11/07/14  Yes Historical Provider, MD  LATUDA 80 MG TABS tablet Take 80 mg by mouth 2 (two) times daily. 11/04/14  Yes Historical Provider, MD  levothyroxine (SYNTHROID, LEVOTHROID) 50 MCG tablet Take 50 mcg by mouth daily. 10/10/14  Yes Historical Provider, MD  lidocaine (LIDODERM) 5 % Place 1 patch onto the skin daily as needed (for pain.).  10/14/14  Yes Historical Provider, MD  metFORMIN (GLUCOPHAGE) 500 MG tablet Take 500 mg by mouth 2 (two) times daily with a meal.   Yes Historical Provider, MD  oxyCODONE-acetaminophen (PERCOCET) 10-325 MG per tablet Take 1 tablet by mouth 3 (three) times daily as needed. For pain.   Yes Robbie Lis, MD  oxymorphone (OPANA ER) 40 MG 12 hr tablet Take 40 mg by mouth every 8 (eight) hours. For back pain   Yes Robbie Lis, MD  pantoprazole (PROTONIX) 40 MG tablet Take 40 mg by mouth daily as needed (for acid reflux).    Yes Historical Provider, MD  simvastatin (ZOCOR) 20 MG tablet Take 20 mg by mouth daily.   Yes Robbie Lis, MD  SUMATRIPTAN SUCCINATE IJ Inject 0.5 mLs as directed as needed (for migraine).    Yes Historical Provider, MD  topiramate (TOPAMAX) 50 MG tablet Take 150-200 mg by mouth at bedtime. 10/10/14  Yes Historical Provider, MD  traZODone (DESYREL) 100 MG tablet Take 200 mg by mouth at bedtime.   Yes Robbie Lis, MD   BP 140/92 mmHg  Pulse 94  Temp(Src) 98 F (36.7 C) (Oral)   Resp 18  SpO2 96%  LMP 01/08/2011 Physical Exam  Constitutional: She is oriented to person, place, and time. She appears well-developed and well-nourished.  Neck: Normal range of motion.  Cardiovascular: Intact distal pulses.   Pulmonary/Chest: Effort normal.  Musculoskeletal:  Right knee is not swollen. There is no redness or warmth. FROM. Joint stable. No calf or thigh tenderness.   Neurological: She is alert and oriented to person, place, and time.  Skin: Skin is warm and dry.    ED Course  Procedures (including critical care  time) Labs Review Labs Reviewed - No data to display  Imaging Review No results found.   EKG Interpretation None      MDM   Final diagnoses:  None    1. Right knee pain  No concern for septic joint based on exam. She has pending appointment with Dr. Alvan Dame for further management. Will provide pain management in ED tonight.    Charlann Lange, PA-C 11/07/14 [place], MD 11/07/14 [phone]

## 2014-11-07 NOTE — ED Notes (Signed)
Pt presents with c/o right leg pain. Pt reports she had a nerve block on the right knee on 5/4 and has had pain since then. Pt has been seen at [facility] for this several days ago and called her doctor reference the pain this morning.

## 2014-11-08 DIAGNOSIS — R29898 Other symptoms and signs involving the musculoskeletal system: Secondary | ICD-10-CM | POA: Diagnosis not present

## 2014-11-08 DIAGNOSIS — E669 Obesity, unspecified: Secondary | ICD-10-CM | POA: Diagnosis not present

## 2014-11-08 DIAGNOSIS — M5440 Lumbago with sciatica, unspecified side: Secondary | ICD-10-CM | POA: Diagnosis not present

## 2014-11-11 DIAGNOSIS — J449 Chronic obstructive pulmonary disease, unspecified: Secondary | ICD-10-CM | POA: Diagnosis not present

## 2014-11-21 ENCOUNTER — Other Ambulatory Visit: Payer: Self-pay

## 2014-11-21 ENCOUNTER — Ambulatory Visit
Admission: RE | Admit: 2014-11-21 | Discharge: 2014-11-21 | Disposition: A | Discharge status: Home / Self Care | Payer: Medicare Other | Source: Ambulatory Visit

## 2014-11-21 DIAGNOSIS — Z1231 Encounter for screening mammogram for malignant neoplasm of breast: Secondary | ICD-10-CM | POA: Diagnosis not present

## 2014-11-21 DIAGNOSIS — E1140 Type 2 diabetes mellitus with diabetic neuropathy, unspecified: Secondary | ICD-10-CM | POA: Diagnosis not present

## 2014-11-21 DIAGNOSIS — E784 Other hyperlipidemia: Secondary | ICD-10-CM | POA: Diagnosis not present

## 2014-11-21 DIAGNOSIS — G43909 Migraine, unspecified, not intractable, without status migrainosus: Secondary | ICD-10-CM | POA: Diagnosis not present

## 2014-11-21 DIAGNOSIS — I10 Essential (primary) hypertension: Secondary | ICD-10-CM | POA: Diagnosis not present

## 2014-11-21 DIAGNOSIS — J42 Unspecified chronic bronchitis: Secondary | ICD-10-CM | POA: Diagnosis not present

## 2014-11-24 DIAGNOSIS — J449 Chronic obstructive pulmonary disease, unspecified: Secondary | ICD-10-CM | POA: Diagnosis not present

## 2014-11-27 DIAGNOSIS — Z86010 Personal history of colonic polyps: Secondary | ICD-10-CM | POA: Diagnosis not present

## 2014-12-01 ENCOUNTER — Emergency Department (HOSPITAL_COMMUNITY)
Admission: EM | Admit: 2014-12-01 | Discharge: 2014-12-01 | Disposition: A | Discharge status: Home / Self Care | Payer: Medicare Other | Attending: Emergency Medicine | Admitting: Emergency Medicine

## 2014-12-01 ENCOUNTER — Encounter (HOSPITAL_COMMUNITY): Payer: Self-pay

## 2014-12-01 DIAGNOSIS — E039 Hypothyroidism, unspecified: Secondary | ICD-10-CM | POA: Insufficient documentation

## 2014-12-01 DIAGNOSIS — Z794 Long term (current) use of insulin: Secondary | ICD-10-CM | POA: Insufficient documentation

## 2014-12-01 DIAGNOSIS — Z9889 Other specified postprocedural states: Secondary | ICD-10-CM | POA: Insufficient documentation

## 2014-12-01 DIAGNOSIS — J449 Chronic obstructive pulmonary disease, unspecified: Secondary | ICD-10-CM | POA: Diagnosis not present

## 2014-12-01 DIAGNOSIS — G8929 Other chronic pain: Secondary | ICD-10-CM | POA: Diagnosis not present

## 2014-12-01 DIAGNOSIS — I10 Essential (primary) hypertension: Secondary | ICD-10-CM | POA: Insufficient documentation

## 2014-12-01 DIAGNOSIS — M25561 Pain in right knee: Secondary | ICD-10-CM | POA: Diagnosis not present

## 2014-12-01 DIAGNOSIS — F419 Anxiety disorder, unspecified: Secondary | ICD-10-CM | POA: Diagnosis not present

## 2014-12-01 DIAGNOSIS — Z87891 Personal history of nicotine dependence: Secondary | ICD-10-CM | POA: Insufficient documentation

## 2014-12-01 DIAGNOSIS — Z79899 Other long term (current) drug therapy: Secondary | ICD-10-CM | POA: Diagnosis not present

## 2014-12-01 DIAGNOSIS — K219 Gastro-esophageal reflux disease without esophagitis: Secondary | ICD-10-CM | POA: Diagnosis not present

## 2014-12-01 DIAGNOSIS — R000 Tachycardia, unspecified: Secondary | ICD-10-CM | POA: Insufficient documentation

## 2014-12-01 DIAGNOSIS — E1140 Type 2 diabetes mellitus with diabetic neuropathy, unspecified: Secondary | ICD-10-CM | POA: Insufficient documentation

## 2014-12-01 DIAGNOSIS — E119 Type 2 diabetes mellitus without complications: Secondary | ICD-10-CM | POA: Insufficient documentation

## 2014-12-01 DIAGNOSIS — F329 Major depressive disorder, single episode, unspecified: Secondary | ICD-10-CM | POA: Insufficient documentation

## 2014-12-01 MED ORDER — HYDROMORPHONE HCL 1 MG/ML IJ SOLN
1.0000 mg | Freq: Once | INTRAMUSCULAR | Status: AC
  Administered 2014-12-01: 1 mg via INTRAMUSCULAR
  Filled 2014-12-01: qty 1

## 2014-12-01 MED ORDER — OXYCODONE-ACETAMINOPHEN 5-325 MG PO TABS: 1.0000 | ORAL_TABLET | ORAL | Status: DC | PRN

## 2014-12-01 NOTE — ED Notes (Signed)
Pt had knee surgery Friday and wasn't given a prescription for any pain meds because she had an existing bottle of pain meds from another Dr. She wasn't getting any relief and called Dr Alvan Dame and he told her to take more of the percocet but she didn't want to do that because then she would run out and not be able to get them refilled. Pt is here to get some pain relief.

## 2014-12-01 NOTE — Discharge Instructions (Signed)
You are having pain following an operative procedure. He was given pain medication in the ER. Given that you are in pain management, you need to follow-up with both her orthopedic surgeon and your pain management physician regarding changes in your pain protocol and further prescriptions.

## 2014-12-01 NOTE — ED Provider Notes (Signed)
CSN: [id number]     Arrival date & time 12/01/14  0023 History   First MD Initiated Contact with Patient 12/01/14 0035     Chief Complaint  Patient presents with  . Knee Pain     (Consider location/radiation/quality/duration/timing/severity/associated sxs/prior Treatment) HPI  This is a 49 year old female who presents with right knee pain. Patient reports that she had a procedure performed by Dr. Alvan Dame at the outpatient surgery center on her right knee yesterday. She at baseline is followed by pain management and takes chronic Percocet. She was not provided prescription for pain following her surgery. She states that her chronic pain medication is prescribed 3 times daily. She reports 10 out of 10 right knee pain. When she called Dr. Aurea Graff office, she was intact instructed to take her Percocet every 3 hours but was not given another prescription. She is fearful that if she takes her Percocet not as directed she will both run out and violate her pain contract with her pain management doctor. She states that she received clearance from her pain management doctor to receive further prescriptions regarding her knee surgery. Denies any fevers or changes to the wound.    Past Medical History  Diagnosis Date  . Abdominal pain   . Diabetes mellitus     states her doctor took her off all DM meds in past month  . COPD (chronic obstructive pulmonary disease)   . GERD (gastroesophageal reflux disease)   . Chronic nausea   . Hypothyroidism   . Mental disorder     Bipolar and schizophrenic  . Depression   . Headache(784.0)   . Anxiety   . Atrial fibrillation 09/29/11    converted spontaneously  . Chronic pain   . Tobacco abuse   . HTN (hypertension)   . Accidental drug overdose April 2013  . Schizophrenia   . Hyperlipidemia   . Chronic back pain   . Chronic knee pain   . Diabetic neuropathy    Past Surgical History  Procedure Laterality Date  . Laparoscopic assisted vaginal hysterectomy   03/04/2011    Procedure: LAPAROSCOPIC ASSISTED VAGINAL HYSTERECTOMY;  Surgeon: Cyril Mourning, MD;  Location: [facility];  Service: Gynecology;  Laterality: N/A;  . Cystocele repair  03/04/2011    Procedure: ANTERIOR REPAIR (CYSTOCELE);  Surgeon: Reece Packer;  Location: [facility];  Service: Urology;  Laterality: N/A;  . Bladder suspension  03/04/2011    Procedure: [place] PROCEDURE;  Surgeon: Elayne Snare MacDiarmid;  Location: [facility];  Service: Urology;  Laterality: N/A;  . Cystoscopy  03/04/2011    Procedure: CYSTOSCOPY;  Surgeon: Elayne Snare MacDiarmid;  Location: [facility];  Service: Urology;  Laterality: N/A;  . Abdominal hysterectomy     Family History  Problem Relation Age of Onset  . Heart attack Father     29s  . Heart attack Sister     51  . Diabetes Mother   . Heart disease Mother   . Hypertension Mother   . COPD Other    History  Substance Use Topics  . Smoking status: Former Smoker -- 1.50 packs/day    Types: Cigarettes  . Smokeless tobacco: Not on file  . Alcohol Use: No   OB History    Gravida Para Term Preterm AB TAB SAB Ectopic Multiple Living   3 3   0 0 0 0 0 3     Review of Systems  Musculoskeletal:       Right knee pain  Skin: Positive  for wound. Negative for color change.  All other systems reviewed and are negative.     Allergies  Aspirin  Home Medications   Prior to Admission medications   Medication Sig Start Date End Date Taking? Authorizing Provider  DULoxetine (CYMBALTA) 20 MG capsule Take 20 mg by mouth daily.   Yes Historical Provider, MD  DULoxetine (CYMBALTA) 60 MG capsule Take 60 mg by mouth at bedtime.   Yes Historical Provider, MD  fluorometholone (FML) 0.1 % ophthalmic suspension Place 1 drop into both eyes 2 (two) times daily as needed (eye irritation).  08/15/14  Yes Historical Provider, MD  gabapentin (NEURONTIN) 400 MG capsule Take 400 mg by mouth 3 (three) times daily. 11/06/14  Yes Historical Provider, MD  HUMALOG MIX 75/25 KWIKPEN (75-25)  100 UNIT/ML Kwikpen Inject 30-40 Units into the skin 2 (two) times daily. 40 units in the morning and 40 units in the evening 10/25/14  Yes Historical Provider, MD  hydrOXYzine (ATARAX/VISTARIL) 25 MG tablet Take 25 mg by mouth 3 (three) times daily as needed for anxiety.  11/07/14  Yes Historical Provider, MD  LATUDA 80 MG TABS tablet Take 80 mg by mouth 2 (two) times daily. 11/04/14  Yes Historical Provider, MD  levothyroxine (SYNTHROID, LEVOTHROID) 50 MCG tablet Take 50 mcg by mouth daily. 10/10/14  Yes Historical Provider, MD  lidocaine (LIDODERM) 5 % Place 1 patch onto the skin daily as needed (for pain.).  10/14/14  Yes Historical Provider, MD  LYRICA 75 MG capsule Take 75 mg by mouth 2 (two) times daily. 11/17/14  Yes Historical Provider, MD  metFORMIN (GLUCOPHAGE) 500 MG tablet Take 500 mg by mouth 2 (two) times daily with a meal.   Yes Historical Provider, MD  oxyCODONE-acetaminophen (PERCOCET) 10-325 MG per tablet Take 1 tablet by mouth every 4 (four) hours as needed for pain. 11/07/14  Yes Charlann Lange, PA-C  oxymorphone (OPANA ER) 40 MG 12 hr tablet Take 40 mg by mouth every 8 (eight) hours. For back pain   Yes Robbie Lis, MD  pantoprazole (PROTONIX) 40 MG tablet Take 40 mg by mouth daily as needed (for acid reflux).    Yes Historical Provider, MD  simvastatin (ZOCOR) 20 MG tablet Take 20 mg by mouth daily.   Yes Robbie Lis, MD  SUMATRIPTAN SUCCINATE IJ Inject 0.5 mLs as directed as needed (for migraine).    Yes Historical Provider, MD  topiramate (TOPAMAX) 50 MG tablet Take 150-200 mg by mouth at bedtime. 10/10/14  Yes Historical Provider, MD  traZODone (DESYREL) 100 MG tablet Take 200 mg by mouth at bedtime.   Yes Robbie Lis, MD  cyclobenzaprine (FLEXERIL) 10 MG tablet Take 1 tablet (10 mg total) by mouth 2 (two) times daily as needed for muscle spasms. Patient not taking: Reported on 12/01/2014 11/04/14   Noland Fordyce, PA-C  oxyCODONE-acetaminophen (PERCOCET/ROXICET) 5-325 MG per  tablet Take 1-2 tablets by mouth every 4 (four) hours as needed for severe pain. 12/01/14   [name], MD   BP 135/98 mmHg  Pulse 104  Temp(Src) 97.9 F (36.6 C) (Oral)  Resp 18  SpO2 91%  LMP 01/08/2011 Physical Exam  Constitutional: She is oriented to person, place, and time. She appears well-developed and well-nourished. No distress.  HENT:  Head: Normocephalic and atraumatic.  Cardiovascular: Regular rhythm.   Tachycardia  Pulmonary/Chest: Effort normal. No respiratory distress.  Abdominal: Soft. Bowel sounds are normal.  Musculoskeletal:  Right knee bandage, bandage removed and dressing intact without  drainage, limited range of motion secondary to pain, no noted overlying skin changes  Neurological: She is alert and oriented to person, place, and time.  Skin: Skin is warm and dry.  Psychiatric: She has a normal mood and affect.  Nursing note and vitals reviewed.   ED Course  Procedures (including critical care time) Labs Review Labs Reviewed - No data to display  Imaging Review No results found.   EKG Interpretation None      MDM   Final diagnoses:  Right knee pain    Patient presents with right knee pain following a procedure at the outpatient surgery center. She has pain medication at home but is taking it 3 times daily which is not controlling her pain. Patient was given IM Dilaudid. I discussed with patient that she needs to follow-up with both Dr. Alvan Dame and her pain management doctor. It does seem reasonable that she should have a second prescription for acute pain related to her surgery. For this reason, I will prescribe her a short course of pain medication to get her through one day until she is able to sort this issue out on her own.  After history, exam, and medical workup I feel the patient has been appropriately medically screened and is safe for discharge home. Pertinent diagnoses were discussed with the patient. Patient was given return  precautions.    [name], MD 12/01/14 [phone]

## 2014-12-02 ENCOUNTER — Emergency Department (HOSPITAL_COMMUNITY)
Admission: EM | Admit: 2014-12-02 | Discharge: 2014-12-02 | Disposition: A | Discharge status: Home / Self Care | Payer: Worker's Compensation | Attending: Emergency Medicine | Admitting: Emergency Medicine

## 2014-12-02 ENCOUNTER — Encounter (HOSPITAL_COMMUNITY): Payer: Self-pay | Admitting: Emergency Medicine

## 2014-12-02 DIAGNOSIS — Z9889 Other specified postprocedural states: Secondary | ICD-10-CM | POA: Insufficient documentation

## 2014-12-02 DIAGNOSIS — G8929 Other chronic pain: Secondary | ICD-10-CM | POA: Insufficient documentation

## 2014-12-02 DIAGNOSIS — G629 Polyneuropathy, unspecified: Secondary | ICD-10-CM | POA: Diagnosis not present

## 2014-12-02 DIAGNOSIS — Z79899 Other long term (current) drug therapy: Secondary | ICD-10-CM | POA: Insufficient documentation

## 2014-12-02 DIAGNOSIS — F419 Anxiety disorder, unspecified: Secondary | ICD-10-CM | POA: Insufficient documentation

## 2014-12-02 DIAGNOSIS — F329 Major depressive disorder, single episode, unspecified: Secondary | ICD-10-CM | POA: Insufficient documentation

## 2014-12-02 DIAGNOSIS — J449 Chronic obstructive pulmonary disease, unspecified: Secondary | ICD-10-CM | POA: Insufficient documentation

## 2014-12-02 DIAGNOSIS — E1140 Type 2 diabetes mellitus with diabetic neuropathy, unspecified: Secondary | ICD-10-CM | POA: Insufficient documentation

## 2014-12-02 DIAGNOSIS — R51 Headache: Secondary | ICD-10-CM | POA: Diagnosis not present

## 2014-12-02 DIAGNOSIS — E039 Hypothyroidism, unspecified: Secondary | ICD-10-CM | POA: Insufficient documentation

## 2014-12-02 DIAGNOSIS — R519 Headache, unspecified: Secondary | ICD-10-CM

## 2014-12-02 DIAGNOSIS — I10 Essential (primary) hypertension: Secondary | ICD-10-CM | POA: Insufficient documentation

## 2014-12-02 DIAGNOSIS — Z87891 Personal history of nicotine dependence: Secondary | ICD-10-CM | POA: Diagnosis not present

## 2014-12-02 DIAGNOSIS — M25561 Pain in right knee: Secondary | ICD-10-CM | POA: Diagnosis not present

## 2014-12-02 MED ORDER — DIPHENHYDRAMINE HCL 50 MG/ML IJ SOLN
12.5000 mg | Freq: Once | INTRAMUSCULAR | Status: AC
  Administered 2014-12-02: 12.5 mg via INTRAVENOUS
  Filled 2014-12-02: qty 1

## 2014-12-02 MED ORDER — METOCLOPRAMIDE HCL 5 MG/ML IJ SOLN
5.0000 mg | Freq: Once | INTRAMUSCULAR | Status: AC
  Administered 2014-12-02: 5 mg via INTRAVENOUS
  Filled 2014-12-02: qty 2

## 2014-12-02 MED ORDER — OXYCODONE-ACETAMINOPHEN 10-325 MG PO TABS: 1.0000 | ORAL_TABLET | ORAL | Status: DC | PRN

## 2014-12-02 MED ORDER — SODIUM CHLORIDE 0.9 % IV BOLUS (SEPSIS)
1000.0000 mL | INTRAVENOUS | Status: AC
  Administered 2014-12-02: 1000 mL via INTRAVENOUS

## 2014-12-02 MED ORDER — HYDROMORPHONE HCL 1 MG/ML IJ SOLN
1.0000 mg | Freq: Once | INTRAMUSCULAR | Status: AC
  Administered 2014-12-02: 1 mg via INTRAVENOUS
  Filled 2014-12-02: qty 1

## 2014-12-02 MED ORDER — HYDROMORPHONE HCL 1 MG/ML IJ SOLN: 1.0000 mg | INTRAMUSCULAR | Status: DC

## 2014-12-02 NOTE — ED Notes (Signed)
Pt states she had right knee surgery Thursday, states the doctor did not give her any pain medication because she had a prescription for Percocet from her pain management clinic. The patient states she explained to him she wasn't allowed to increase the dosing regimen of her Percocet because it's managed through the pain clinic. Pt is in immense amounts of pain in triage, knee is currently wrapped at this time, temp 99.3, O2 sat 91% on RA, and tachycardic

## 2014-12-02 NOTE — ED Provider Notes (Signed)
CSN: [id number]     Arrival date & time 12/02/14  1207 History   First MD Initiated Contact with Patient 12/02/14 1213     Chief Complaint  Patient presents with  . Post-op Problem  . Knee Pain     (Consider location/radiation/quality/duration/timing/severity/associated sxs/prior Treatment) Patient is a 48 y.o. female presenting with knee pain. The history is provided by the patient.  Knee Pain Location:  Knee Time since incident:  3 days Injury: no   Knee location:  R knee Pain details:    Quality:  Aching   Radiates to:  Does not radiate   Severity:  Moderate   Onset quality:  Gradual   Timing:  Constant   Progression:  Unchanged Chronicity:  New Dislocation: no   Prior injury to area:  No Relieved by: percocet. Worsened by:  Activity Ineffective treatments:  None tried Associated symptoms: no back pain, no fatigue, no fever and no neck pain     Past Medical History  Diagnosis Date  . Abdominal pain   . Diabetes mellitus     states her doctor took her off all DM meds in past month  . COPD (chronic obstructive pulmonary disease)   . GERD (gastroesophageal reflux disease)   . Chronic nausea   . Hypothyroidism   . Mental disorder     Bipolar and schizophrenic  . Depression   . Headache(784.0)   . Anxiety   . Atrial fibrillation 09/29/11    converted spontaneously  . Chronic pain   . Tobacco abuse   . HTN (hypertension)   . Accidental drug overdose April 2013  . Schizophrenia   . Hyperlipidemia   . Chronic back pain   . Chronic knee pain   . Diabetic neuropathy    Past Surgical History  Procedure Laterality Date  . Laparoscopic assisted vaginal hysterectomy  03/04/2011    Procedure: LAPAROSCOPIC ASSISTED VAGINAL HYSTERECTOMY;  Surgeon: Cyril Mourning, MD;  Location: [facility];  Service: Gynecology;  Laterality: N/A;  . Cystocele repair  03/04/2011    Procedure: ANTERIOR REPAIR (CYSTOCELE);  Surgeon: Reece Packer;  Location: [facility];  Service: Urology;   Laterality: N/A;  . Bladder suspension  03/04/2011    Procedure: [place] PROCEDURE;  Surgeon: Elayne Snare MacDiarmid;  Location: [facility];  Service: Urology;  Laterality: N/A;  . Cystoscopy  03/04/2011    Procedure: CYSTOSCOPY;  Surgeon: Elayne Snare MacDiarmid;  Location: [facility];  Service: Urology;  Laterality: N/A;  . Abdominal hysterectomy     Family History  Problem Relation Age of Onset  . Heart attack Father     6s  . Heart attack Sister     57  . Diabetes Mother   . Heart disease Mother   . Hypertension Mother   . COPD Other    History  Substance Use Topics  . Smoking status: Former Smoker -- 1.50 packs/day    Types: Cigarettes  . Smokeless tobacco: Not on file  . Alcohol Use: No   OB History    Gravida Para Term Preterm AB TAB SAB Ectopic Multiple Living   3 3   0 0 0 0 0 3     Review of Systems  Constitutional: Negative for fever and fatigue.  HENT: Negative for congestion and drooling.   Eyes: Negative for pain.  Respiratory: Negative for cough and shortness of breath.   Cardiovascular: Negative for chest pain.  Gastrointestinal: Negative for nausea, vomiting, abdominal pain and diarrhea.  Genitourinary: Negative for  dysuria and hematuria.  Musculoskeletal: Negative for back pain, gait problem and neck pain.       Right knee pain  Skin: Negative for color change.  Neurological: Negative for dizziness and headaches.  Hematological: Negative for adenopathy.  Psychiatric/Behavioral: Negative for behavioral problems.  All other systems reviewed and are negative.     Allergies  Aspirin  Home Medications   Prior to Admission medications   Medication Sig Start Date End Date Taking? Authorizing Provider  cyclobenzaprine (FLEXERIL) 10 MG tablet Take 1 tablet (10 mg total) by mouth 2 (two) times daily as needed for muscle spasms. 11/04/14  Yes Noland Fordyce, PA-C  DULoxetine (CYMBALTA) 20 MG capsule Take 20 mg by mouth daily.   Yes Historical Provider, MD  DULoxetine  (CYMBALTA) 60 MG capsule Take 60 mg by mouth at bedtime.   Yes Historical Provider, MD  gabapentin (NEURONTIN) 400 MG capsule Take 400 mg by mouth 3 (three) times daily. 11/06/14  Yes Historical Provider, MD  HUMALOG MIX 75/25 KWIKPEN (75-25) 100 UNIT/ML Kwikpen Inject 40 Units into the skin 2 (two) times daily. 40 units in the morning and 40 units in the evening 10/25/14  Yes Historical Provider, MD  hydrOXYzine (ATARAX/VISTARIL) 25 MG tablet Take 25 mg by mouth 3 (three) times daily as needed for anxiety.  11/07/14  Yes Historical Provider, MD  LATUDA 80 MG TABS tablet Take 80 mg by mouth 2 (two) times daily. 11/04/14  Yes Historical Provider, MD  levothyroxine (SYNTHROID, LEVOTHROID) 50 MCG tablet Take 50 mcg by mouth daily. 10/10/14  Yes Historical Provider, MD  lidocaine (LIDODERM) 5 % Place 1 patch onto the skin daily as needed (for pain.).  10/14/14  Yes Historical Provider, MD  LYRICA 75 MG capsule Take 75 mg by mouth 2 (two) times daily. 11/17/14  Yes Historical Provider, MD  metFORMIN (GLUCOPHAGE) 500 MG tablet Take 500 mg by mouth 2 (two) times daily with a meal.   Yes Historical Provider, MD  oxyCODONE-acetaminophen (PERCOCET) 10-325 MG per tablet Take 1 tablet by mouth every 4 (four) hours as needed for pain. Patient taking differently: Take 1 tablet by mouth every 8 (eight) hours as needed for pain.  11/07/14  Yes Charlann Lange, PA-C  oxyCODONE-acetaminophen (PERCOCET/ROXICET) 5-325 MG per tablet Take 1-2 tablets by mouth every 4 (four) hours as needed for severe pain. 12/01/14  Yes [name], MD  oxymorphone (OPANA ER) 40 MG 12 hr tablet Take 40 mg by mouth every 8 (eight) hours. For back pain   Yes Robbie Lis, MD  SUMATRIPTAN SUCCINATE IJ Inject 0.5 mLs as directed as needed (for migraine).    Yes Historical Provider, MD  traZODone (DESYREL) 100 MG tablet Take 200 mg by mouth at bedtime.   Yes Robbie Lis, MD  gabapentin (NEURONTIN) 600 MG tablet  11/07/14   Historical Provider, MD   topiramate (TOPAMAX) 50 MG tablet Take 150-200 mg by mouth at bedtime. 10/10/14   Historical Provider, MD   BP 136/81 mmHg  Pulse 111  Temp(Src) 99.3 F (37.4 C) (Oral)  Resp 22  SpO2 93%  LMP 01/08/2011 Physical Exam  Constitutional: She is oriented to person, place, and time. She appears well-developed and well-nourished.  HENT:  Head: Normocephalic.  Mouth/Throat: Oropharynx is clear and moist. No oropharyngeal exudate.  Eyes: Conjunctivae and EOM are normal. Pupils are equal, round, and reactive to light.  Neck: Normal range of motion. Neck supple.  Cardiovascular: Normal rate, regular rhythm, normal heart sounds and  intact distal pulses.  Exam reveals no gallop and no friction rub.   No murmur heard. Pulmonary/Chest: Effort normal and breath sounds normal. No respiratory distress. She has no wheezes.  Abdominal: Soft. Bowel sounds are normal. There is no tenderness. There is no rebound and no guarding.  Musculoskeletal: Normal range of motion. She exhibits no edema or tenderness.  Normal appearance of right knee without significant erythema or evidence of infection.  2+ distal pulses in the right lower extremity.  Neurological: She is alert and oriented to person, place, and time.  alert, oriented x3 speech: normal in context and clarity memory: intact grossly cranial nerves II-XII: intact motor strength: full proximally and distally no involuntary movements or tremors sensation: intact to light touch diffusely  cerebellar: finger-to-nose intact gait:deferred  Skin: Skin is warm and dry.  Psychiatric: She has a normal mood and affect. Her behavior is normal.  Nursing note and vitals reviewed.   ED Course  Procedures (including critical care time) Labs Review Labs Reviewed - No data to display  Imaging Review No results found.   EKG Interpretation None      MDM   Final diagnoses:  Right knee pain  Headache, unspecified headache type    2:33 PM 49 y.o.  female s/p right knee surgery on 11/30/2014 who presents with ongoing right knee pain. The patient states that she is in a pain contract with her pain management doctor. She was not given pain medicine by her orthopedist, Dr. Alvan Dame, for this reason. She was seen in the ER early on the morning of 12/01/2014 as she did not want to use too much of her already prescribed Percocet. She was given pain control and a prescription for 8 Percocet from the ER. She notes that she did get a hold of her pain management doctor and supposedly they had called her in a prescription but when she went to get it today it was not there. She states that she does have about 20 Percocet left but does not want to violate her pain contract and take more than 3 per day. She denies any fevers. She states that she has had a left-sided waxing waning headache since the surgery. She notes that she has a history of headaches and this is consistent with her previous headaches. She states that she has taken some kind of injection at home which she typically takes for her headaches without relief. She denies any fevers. I did not ambulate her due to the knee surgery but she otherwise has a normal neurologic exam. Will get pain control.  3:52 PM: HA improved. Pt continues to appear well. Pain controlled. I have discussed the diagnosis/risks/treatment options with the patient and believe the pt to be eligible for discharge home to follow-up with her pain mgmt doctor tomorrow. We also discussed returning to the ED immediately if new or worsening sx occur. We discussed the sx which are most concerning (e.g., worsening pain, fever, spreading redness from wound) that necessitate immediate return. Medications administered to the patient during their visit and any new prescriptions provided to the patient are listed below.  Medications given during this visit Medications  sodium chloride 0.9 % bolus 1,000 mL (1,000 mLs Intravenous New Bag/Given 12/02/14  1429)  HYDROmorphone (DILAUDID) injection 1 mg (1 mg Intravenous Given 12/02/14 1430)  metoCLOPramide (REGLAN) injection 5 mg (5 mg Intravenous Given 12/02/14 1431)  diphenhydrAMINE (BENADRYL) injection 12.5 mg (12.5 mg Intravenous Given 12/02/14 1432)    New Prescriptions  OXYCODONE-ACETAMINOPHEN (PERCOCET) 10-325 MG PER TABLET    Take 1 tablet by mouth every 4 (four) hours as needed for pain.     Pamella Pert, MD 12/02/14 [phone]

## 2014-12-02 NOTE — ED Notes (Signed)
Bed: OF18 Expected date:  Expected time:  Means of arrival:  Comments: TR 6

## 2014-12-02 NOTE — ED Notes (Signed)
MD at bedside. 

## 2014-12-12 DIAGNOSIS — J449 Chronic obstructive pulmonary disease, unspecified: Secondary | ICD-10-CM | POA: Diagnosis not present

## 2014-12-24 DIAGNOSIS — J449 Chronic obstructive pulmonary disease, unspecified: Secondary | ICD-10-CM | POA: Diagnosis not present

## 2014-12-28 DIAGNOSIS — E1140 Type 2 diabetes mellitus with diabetic neuropathy, unspecified: Secondary | ICD-10-CM | POA: Diagnosis not present

## 2014-12-28 DIAGNOSIS — E669 Obesity, unspecified: Secondary | ICD-10-CM | POA: Diagnosis not present

## 2014-12-28 DIAGNOSIS — I10 Essential (primary) hypertension: Secondary | ICD-10-CM | POA: Diagnosis not present

## 2014-12-28 DIAGNOSIS — H6121 Impacted cerumen, right ear: Secondary | ICD-10-CM | POA: Diagnosis not present

## 2014-12-28 DIAGNOSIS — E784 Other hyperlipidemia: Secondary | ICD-10-CM | POA: Diagnosis not present

## 2014-12-28 DIAGNOSIS — J42 Unspecified chronic bronchitis: Secondary | ICD-10-CM | POA: Diagnosis not present

## 2014-12-28 DIAGNOSIS — H6123 Impacted cerumen, bilateral: Secondary | ICD-10-CM | POA: Diagnosis not present

## 2014-12-28 DIAGNOSIS — F319 Bipolar disorder, unspecified: Secondary | ICD-10-CM | POA: Diagnosis not present

## 2015-01-05 DIAGNOSIS — M5440 Lumbago with sciatica, unspecified side: Secondary | ICD-10-CM | POA: Diagnosis not present

## 2015-01-05 DIAGNOSIS — M25561 Pain in right knee: Secondary | ICD-10-CM | POA: Insufficient documentation

## 2015-01-05 DIAGNOSIS — E669 Obesity, unspecified: Secondary | ICD-10-CM | POA: Diagnosis not present

## 2015-01-11 DIAGNOSIS — J449 Chronic obstructive pulmonary disease, unspecified: Secondary | ICD-10-CM | POA: Diagnosis not present

## 2015-01-24 DIAGNOSIS — J449 Chronic obstructive pulmonary disease, unspecified: Secondary | ICD-10-CM | POA: Diagnosis not present

## 2015-02-01 ENCOUNTER — Encounter: Payer: Self-pay | Admitting: Endocrinology

## 2015-02-01 ENCOUNTER — Ambulatory Visit (INDEPENDENT_AMBULATORY_CARE_PROVIDER_SITE_OTHER): Payer: Medicare Other | Admitting: Endocrinology

## 2015-02-01 VITALS — BP 129/84 | HR 82 | Temp 97.7°F | Ht 62.0 in | Wt 194.0 lb

## 2015-02-01 DIAGNOSIS — E1142 Type 2 diabetes mellitus with diabetic polyneuropathy: Secondary | ICD-10-CM

## 2015-02-01 DIAGNOSIS — E119 Type 2 diabetes mellitus without complications: Secondary | ICD-10-CM | POA: Insufficient documentation

## 2015-02-01 MED ORDER — HUMALOG MIX 75/25 KWIKPEN (75-25) 100 UNIT/ML ~~LOC~~ SUPN: 80.0000 [IU] | PEN_INJECTOR | Freq: Two times a day (BID) | SUBCUTANEOUS | Status: DC

## 2015-02-01 NOTE — Progress Notes (Signed)
Subjective:    Patient ID: [name], female    DOB: 1966-01-30, 49 y.o.   MRN: [id number]  HPI pt states DM was dx'ed in 2005; he has mild neuropathy of the lower extremities; she is unaware of any associated chronic complications; she has been on insulin since 2013; pt says her diet is "fair," and exercise is limited by health problems; he has never had GDM, pancreatitis, severe hypoglycemia or DKA.  She says cbg's are still in the 300's, despite a recent insulin increase. Past Medical History  Diagnosis Date  . Abdominal pain   . Diabetes mellitus     states her doctor took her off all DM meds in past month  . COPD (chronic obstructive pulmonary disease)   . GERD (gastroesophageal reflux disease)   . Chronic nausea   . Hypothyroidism   . Mental disorder     Bipolar and schizophrenic  . Depression   . Headache(784.0)   . Anxiety   . Atrial fibrillation 09/29/11    converted spontaneously  . Chronic pain   . Tobacco abuse   . HTN (hypertension)   . Accidental drug overdose April 2013  . Schizophrenia   . Hyperlipidemia   . Chronic back pain   . Chronic knee pain   . Diabetic neuropathy     Past Surgical History  Procedure Laterality Date  . Laparoscopic assisted vaginal hysterectomy  03/04/2011    Procedure: LAPAROSCOPIC ASSISTED VAGINAL HYSTERECTOMY;  Surgeon: Cyril Mourning, MD;  Location: [facility];  Service: Gynecology;  Laterality: N/A;  . Cystocele repair  03/04/2011    Procedure: ANTERIOR REPAIR (CYSTOCELE);  Surgeon: Reece Packer;  Location: [facility];  Service: Urology;  Laterality: N/A;  . Bladder suspension  03/04/2011    Procedure: [place] PROCEDURE;  Surgeon: Elayne Snare MacDiarmid;  Location: [facility];  Service: Urology;  Laterality: N/A;  . Cystoscopy  03/04/2011    Procedure: CYSTOSCOPY;  Surgeon: Elayne Snare MacDiarmid;  Location: [facility];  Service: Urology;  Laterality: N/A;  . Abdominal hysterectomy      Social History   Social History  . Marital Status:  Legally Separated    Spouse Name: N/A  . Number of Children: N/A  . Years of Education: N/A   Occupational History  . Not on file.   Social History Main Topics  . Smoking status: Former Smoker -- 1.50 packs/day    Types: Cigarettes  . Smokeless tobacco: Not on file  . Alcohol Use: No  . Drug Use: No  . Sexual Activity:    Partners: Male    Birth Control/ Protection: Surgical   Other Topics Concern  . Not on file   Social History Narrative    Current Outpatient Prescriptions on File Prior to Visit  Medication Sig Dispense Refill  . cyclobenzaprine (FLEXERIL) 10 MG tablet Take 1 tablet (10 mg total) by mouth 2 (two) times daily as needed for muscle spasms. 20 tablet 0  . DULoxetine (CYMBALTA) 20 MG capsule Take 20 mg by mouth daily.    . DULoxetine (CYMBALTA) 60 MG capsule Take 60 mg by mouth at bedtime.    . gabapentin (NEURONTIN) 400 MG capsule Take 400 mg by mouth 3 (three) times daily.    Marland Kitchen gabapentin (NEURONTIN) 600 MG tablet     . hydrOXYzine (ATARAX/VISTARIL) 25 MG tablet Take 25 mg by mouth 3 (three) times daily as needed for anxiety.     Marland Kitchen LATUDA 80 MG TABS tablet Take 80 mg  by mouth 2 (two) times daily.    Marland Kitchen levothyroxine (SYNTHROID, LEVOTHROID) 50 MCG tablet Take 50 mcg by mouth daily.  5  . lidocaine (LIDODERM) 5 % Place 1 patch onto the skin daily as needed (for pain.).   2  . LYRICA 75 MG capsule Take 75 mg by mouth 2 (two) times daily.  2  . metFORMIN (GLUCOPHAGE) 500 MG tablet Take 500 mg by mouth 2 (two) times daily with a meal.    . oxyCODONE-acetaminophen (PERCOCET) 10-325 MG per tablet Take 1 tablet by mouth every 4 (four) hours as needed for pain. 15 tablet 0  . oxymorphone (OPANA ER) 40 MG 12 hr tablet Take 40 mg by mouth every 8 (eight) hours. For back pain    . SUMATRIPTAN SUCCINATE IJ Inject 0.5 mLs as directed as needed (for migraine).     . topiramate (TOPAMAX) 50 MG tablet Take 150-200 mg by mouth at bedtime.  12  . traZODone (DESYREL) 100 MG tablet  Take 200 mg by mouth at bedtime.     No current facility-administered medications on file prior to visit.    Allergies  Allergen Reactions  . Aspirin Nausea And Vomiting    Does fine with ibuprofen    Family History  Problem Relation Age of Onset  . Heart attack Father     36s  . Heart attack Sister     72  . Diabetes Mother   . Heart disease Mother   . Hypertension Mother   . COPD Other     BP 129/84 mmHg  Pulse 82  Temp(Src) 97.7 F (36.5 C) (Oral)  Ht 5\' 2"  (1.575 m)  Wt 194 lb (87.998 kg)  BMI 35.47 kg/m2  SpO2 94%  LMP 01/08/2011     Review of Systems denies blurry vision, chest pain, sob, n/v, memory loss, cold intolerance, and rhinorrhea.  She has chronic low-back pain.  She has weight gain, urinary frequency, excessive diaphoresis, easy bruising, and headache.    Objective:   Physical Exam VS: see vs page GEN: no distress HEAD: head: no deformity eyes: no periorbital swelling, no proptosis external nose and ears are normal mouth: no lesion seen NECK: supple, thyroid is not enlarged CHEST WALL: no deformity LUNGS:  Clear to auscultation CV: reg rate and rhythm, no murmur ABD: abdomen is soft, nontender.  no hepatosplenomegaly.  not distended.  no hernia MUSCULOSKELETAL: muscle bulk and strength are grossly normal.  no obvious joint swelling.  gait is normal and steady EXTEMITIES: no deformity.  no ulcer on the feet.  feet are of normal color and temp.  no edema PULSES: dorsalis pedis intact bilat.  no carotid bruit NEURO:  cn 2-12 grossly intact.   readily moves all 4's.  sensation is intact to touch on the feet SKIN:  Normal texture and temperature.  No rash or suspicious lesion is visible.   NODES:  None palpable at the neck PSYCH: alert, well-oriented.  Does not appear anxious nor depressed.   i personally reviewed electrocardiogram tracing (07/27/13): normal to my reading  Lab Results  Component Value Date   CREATININE 0.65 09/04/2013   BUN  11 09/04/2013   NA 141 09/04/2013   K 3.5* 09/04/2013   CL 100 09/04/2013   CO2 25 09/04/2013  echocardiogram (09/30/2011): normal.    Assessment & Plan:  DM: severe exacerbation.  We discussed options.  He wants to stay with BID insulin, at least for now.    Patient is advised the  following: Patient Instructions  good diet and exercise significantly improve the control of your diabetes.  please let me know if you wish to be referred to a dietician.  high blood sugar is very risky to your health.  you should see an eye doctor and dentist every year.  It is very important to get all recommended vaccinations.  controlling your blood pressure and cholesterol drastically reduces the damage diabetes does to your body.  Those who smoke should quit.  please discuss these with your doctor.  check your blood sugar twice a day.  vary the time of day when you check, between before the 3 meals, and at bedtime.  also check if you have symptoms of your blood sugar being too high or too low.  please keep a record of the readings and bring it to your next appointment here.  You can write it on any piece of paper.  please call us sooner if your blood sugar goes below 70, or if you have a lot of readings over 200. Blood and urine tests are requested for you today.  We'll let you know about the results. Please increase the insulin to 80 units twice a day (with your first and last meals of the day).  Please come back for a follow-up appointment in 1 month.  Please call us next week, to tell us how the blood sugar is doing.

## 2015-02-01 NOTE — Patient Instructions (Addendum)
good diet and exercise significantly improve the control of your diabetes.  please let me know if you wish to be referred to a dietician.  high blood sugar is very risky to your health.  you should see an eye doctor and dentist every year.  It is very important to get all recommended vaccinations.  controlling your blood pressure and cholesterol drastically reduces the damage diabetes does to your body.  Those who smoke should quit.  please discuss these with your doctor.  check your blood sugar twice a day.  vary the time of day when you check, between before the 3 meals, and at bedtime.  also check if you have symptoms of your blood sugar being too high or too low.  please keep a record of the readings and bring it to your next appointment here.  You can write it on any piece of paper.  please call us sooner if your blood sugar goes below 70, or if you have a lot of readings over 200. Blood and urine tests are requested for you today.  We'll let you know about the results. Please increase the insulin to 80 units twice a day (with your first and last meals of the day).  Please come back for a follow-up appointment in 1 month.  Please call us next week, to tell us how the blood sugar is doing.

## 2015-02-08 DIAGNOSIS — E1140 Type 2 diabetes mellitus with diabetic neuropathy, unspecified: Secondary | ICD-10-CM | POA: Diagnosis not present

## 2015-02-08 DIAGNOSIS — J42 Unspecified chronic bronchitis: Secondary | ICD-10-CM | POA: Diagnosis not present

## 2015-02-08 DIAGNOSIS — E039 Hypothyroidism, unspecified: Secondary | ICD-10-CM | POA: Diagnosis not present

## 2015-02-08 DIAGNOSIS — E784 Other hyperlipidemia: Secondary | ICD-10-CM | POA: Diagnosis not present

## 2015-02-08 DIAGNOSIS — I10 Essential (primary) hypertension: Secondary | ICD-10-CM | POA: Diagnosis not present

## 2015-02-11 ENCOUNTER — Emergency Department (HOSPITAL_COMMUNITY)
Admission: EM | Admit: 2015-02-11 | Discharge: 2015-02-11 | Disposition: A | Discharge status: Home / Self Care | Payer: Medicare Other | Attending: Emergency Medicine | Admitting: Emergency Medicine

## 2015-02-11 ENCOUNTER — Encounter (HOSPITAL_COMMUNITY): Payer: Self-pay | Admitting: Nurse Practitioner

## 2015-02-11 DIAGNOSIS — K219 Gastro-esophageal reflux disease without esophagitis: Secondary | ICD-10-CM | POA: Insufficient documentation

## 2015-02-11 DIAGNOSIS — G629 Polyneuropathy, unspecified: Secondary | ICD-10-CM | POA: Insufficient documentation

## 2015-02-11 DIAGNOSIS — J449 Chronic obstructive pulmonary disease, unspecified: Secondary | ICD-10-CM | POA: Diagnosis not present

## 2015-02-11 DIAGNOSIS — R110 Nausea: Secondary | ICD-10-CM | POA: Diagnosis not present

## 2015-02-11 DIAGNOSIS — F329 Major depressive disorder, single episode, unspecified: Secondary | ICD-10-CM | POA: Insufficient documentation

## 2015-02-11 DIAGNOSIS — M545 Low back pain, unspecified: Secondary | ICD-10-CM

## 2015-02-11 DIAGNOSIS — E039 Hypothyroidism, unspecified: Secondary | ICD-10-CM | POA: Diagnosis not present

## 2015-02-11 DIAGNOSIS — G8929 Other chronic pain: Secondary | ICD-10-CM | POA: Diagnosis not present

## 2015-02-11 DIAGNOSIS — F209 Schizophrenia, unspecified: Secondary | ICD-10-CM | POA: Diagnosis not present

## 2015-02-11 DIAGNOSIS — Z79899 Other long term (current) drug therapy: Secondary | ICD-10-CM | POA: Insufficient documentation

## 2015-02-11 DIAGNOSIS — Z87891 Personal history of nicotine dependence: Secondary | ICD-10-CM | POA: Diagnosis not present

## 2015-02-11 DIAGNOSIS — I10 Essential (primary) hypertension: Secondary | ICD-10-CM | POA: Insufficient documentation

## 2015-02-11 DIAGNOSIS — M62830 Muscle spasm of back: Secondary | ICD-10-CM | POA: Diagnosis not present

## 2015-02-11 DIAGNOSIS — E119 Type 2 diabetes mellitus without complications: Secondary | ICD-10-CM | POA: Insufficient documentation

## 2015-02-11 MED ORDER — METHOCARBAMOL 500 MG PO TABS: 1000.0000 mg | ORAL_TABLET | Freq: Three times a day (TID) | ORAL | Status: DC

## 2015-02-11 MED ORDER — KETOROLAC TROMETHAMINE 60 MG/2ML IM SOLN
60.0000 mg | Freq: Once | INTRAMUSCULAR | Status: AC
  Administered 2015-02-11: 60 mg via INTRAMUSCULAR
  Filled 2015-02-11: qty 2

## 2015-02-11 MED ORDER — METHOCARBAMOL 500 MG PO TABS
1000.0000 mg | ORAL_TABLET | Freq: Once | ORAL | Status: AC
  Administered 2015-02-11: 1000 mg via ORAL
  Filled 2015-02-11: qty 2

## 2015-02-11 MED ORDER — IBUPROFEN 800 MG PO TABS: 800.0000 mg | ORAL_TABLET | Freq: Three times a day (TID) | ORAL | Status: DC

## 2015-02-11 NOTE — ED Provider Notes (Signed)
CSN: [id number]     Arrival date & time 02/11/15  1934 History  This chart was scribed for non-physician practitioner, Lenard Galloway, working with Varney Biles, MD by Evelene Croon, ED Scribe. This patient was seen in room WTR6/WTR6 and the patient's care was started at 8:38 PM.    Chief Complaint  Patient presents with  . Back Pain   The history is provided by the patient. No language interpreter was used.     HPI Comments:  [name] is a 49 y.o. female with a history of chronic back pain who presents to the Emergency Department complaining of sharp lower back pain that worsened this am pain. She notes her pain radiates down her BLE which is typical with her chronic back pain. She denies trouble breathing and chest pain. She reports mild associated nausea. She has been taking percocet without relief.    Past Medical History  Diagnosis Date  . Abdominal pain   . Diabetes mellitus     states her doctor took her off all DM meds in past month  . COPD (chronic obstructive pulmonary disease)   . GERD (gastroesophageal reflux disease)   . Chronic nausea   . Hypothyroidism   . Mental disorder     Bipolar and schizophrenic  . Depression   . Headache(784.0)   . Anxiety   . Atrial fibrillation 09/29/11    converted spontaneously  . Chronic pain   . Tobacco abuse   . HTN (hypertension)   . Accidental drug overdose April 2013  . Schizophrenia   . Hyperlipidemia   . Chronic back pain   . Chronic knee pain   . Diabetic neuropathy    Past Surgical History  Procedure Laterality Date  . Laparoscopic assisted vaginal hysterectomy  03/04/2011    Procedure: LAPAROSCOPIC ASSISTED VAGINAL HYSTERECTOMY;  Surgeon: Cyril Mourning, MD;  Location: [facility];  Service: Gynecology;  Laterality: N/A;  . Cystocele repair  03/04/2011    Procedure: ANTERIOR REPAIR (CYSTOCELE);  Surgeon: Reece Packer;  Location: [facility];  Service: Urology;  Laterality: N/A;  . Bladder suspension   03/04/2011    Procedure: [place] PROCEDURE;  Surgeon: Elayne Snare MacDiarmid;  Location: [facility];  Service: Urology;  Laterality: N/A;  . Cystoscopy  03/04/2011    Procedure: CYSTOSCOPY;  Surgeon: Elayne Snare MacDiarmid;  Location: [facility];  Service: Urology;  Laterality: N/A;  . Abdominal hysterectomy     Family History  Problem Relation Age of Onset  . Heart attack Father     64s  . Heart attack Sister     50  . Diabetes Mother   . Heart disease Mother   . Hypertension Mother   . COPD Other    Social History  Substance Use Topics  . Smoking status: Former Smoker -- 1.50 packs/day    Types: Cigarettes  . Smokeless tobacco: None  . Alcohol Use: No   OB History    Gravida Para Term Preterm AB TAB SAB Ectopic Multiple Living   3 3   0 0 0 0 0 3     Review of Systems  Respiratory: Negative for shortness of breath.   Cardiovascular: Negative for chest pain.  Gastrointestinal: Positive for nausea.  Musculoskeletal: Positive for back pain.      Allergies  Aspirin  Home Medications   Prior to Admission medications   Medication Sig Start Date End Date Taking? Authorizing Provider  cyclobenzaprine (FLEXERIL) 10 MG tablet Take 1 tablet (  10 mg total) by mouth 2 (two) times daily as needed for muscle spasms. 11/04/14   Noland Fordyce, PA-C  DULoxetine (CYMBALTA) 20 MG capsule Take 20 mg by mouth daily.    Historical Provider, MD  DULoxetine (CYMBALTA) 60 MG capsule Take 60 mg by mouth at bedtime.    Historical Provider, MD  gabapentin (NEURONTIN) 400 MG capsule Take 400 mg by mouth 3 (three) times daily. 11/06/14   Historical Provider, MD  gabapentin (NEURONTIN) 600 MG tablet  11/07/14   Historical Provider, MD  HUMALOG MIX 75/25 KWIKPEN (75-25) 100 UNIT/ML Kwikpen Inject 80 Units into the skin 2 (two) times daily with a meal. 02/01/15   Renato Shin, MD  hydrOXYzine (ATARAX/VISTARIL) 25 MG tablet Take 25 mg by mouth 3 (three) times daily as needed for anxiety.  11/07/14   Historical Provider, MD   ibuprofen (ADVIL,MOTRIN) 800 MG tablet Take 800 mg by mouth every 8 (eight) hours as needed.    Historical Provider, MD  LATUDA 80 MG TABS tablet Take 80 mg by mouth 2 (two) times daily. 11/04/14   Historical Provider, MD  levothyroxine (SYNTHROID, LEVOTHROID) 50 MCG tablet Take 50 mcg by mouth daily. 10/10/14   Historical Provider, MD  lidocaine (LIDODERM) 5 % Place 1 patch onto the skin daily as needed (for pain.).  10/14/14   Historical Provider, MD  LYRICA 75 MG capsule Take 75 mg by mouth 2 (two) times daily. 11/17/14   Historical Provider, MD  metFORMIN (GLUCOPHAGE) 500 MG tablet Take 500 mg by mouth 2 (two) times daily with a meal.    Historical Provider, MD  oxyCODONE-acetaminophen (PERCOCET) 10-325 MG per tablet Take 1 tablet by mouth every 4 (four) hours as needed for pain. 12/02/14   Pamella Pert, MD  oxymorphone (OPANA ER) 40 MG 12 hr tablet Take 40 mg by mouth every 8 (eight) hours. For back pain    Robbie Lis, MD  SUMATRIPTAN SUCCINATE IJ Inject 0.5 mLs as directed as needed (for migraine).     Historical Provider, MD  topiramate (TOPAMAX) 50 MG tablet Take 150-200 mg by mouth at bedtime. 10/10/14   Historical Provider, MD  traZODone (DESYREL) 100 MG tablet Take 200 mg by mouth at bedtime.    Robbie Lis, MD   BP 148/101 mmHg  Pulse 108  Temp(Src) 98.7 F (37.1 C) (Oral)  Resp 18  Ht 5\' 2"  (1.575 m)  Wt 187 lb (84.823 kg)  BMI 34.19 kg/m2  SpO2 93%  LMP 01/08/2011 Physical Exam  Constitutional: She is oriented to person, place, and time. She appears well-developed and well-nourished. No distress.  HENT:  Head: Normocephalic and atraumatic.  Eyes: Conjunctivae are normal.  Neck: No tracheal deviation present.  Cardiovascular: Normal rate.   Pulmonary/Chest: Effort normal.  Abdominal: Soft. She exhibits no distension. There is no tenderness.  Musculoskeletal: She exhibits tenderness. She exhibits no edema.  No swelling of the lower back.  Pain with foraward flexion  and with palpation of bilateral paralumbar area  Distal pulses intact    Neurological: She is alert and oriented to person, place, and time.  Skin: Skin is warm and dry.  Psychiatric: She has a normal mood and affect.  Nursing note and vitals reviewed.   ED Course  Procedures   DIAGNOSTIC STUDIES:  Oxygen Saturation is 93% on RA, low by my interpretation.    COORDINATION OF CARE:  8:40 PM Discussed treatment plan with pt at bedside and pt agreed to plan.  Labs Review Labs Reviewed - No data to display  Imaging Review No results found. I have personally reviewed and evaluated these images and lab results as part of my medical decision-making.   EKG Interpretation None      MDM   Final diagnoses:  None    1. Chronic back pain  The patient has Percocet at home. Will provide muscle relaxer and IM Toradol in the department for added relief. Encouraged PCP follow up for recheck in 2 days.   I personally performed the services described in this documentation, which was scribed in my presence. The recorded information has been reviewed and is accurate.     Charlann Lange, PA-C 02/12/15 0559  Varney Biles, MD 02/12/15 [phone]

## 2015-02-11 NOTE — ED Notes (Signed)
Pt c/o lower back pain 9/10, sudden onset while at church, states she took percocet at home without relief. Endorses that it is "pain she always has", recently had a nerve study with the surgeon for the same.

## 2015-02-11 NOTE — Discharge Instructions (Signed)
Heat Therapy Heat therapy can help ease sore, stiff, injured, and tight muscles and joints. Heat relaxes your muscles, which may help ease your pain.  RISKS AND COMPLICATIONS If you have any of the following conditions, do not use heat therapy unless your health care provider has approved:  Poor circulation.  Healing wounds or scarred skin in the area being treated.  Diabetes, heart disease, or high blood pressure.  Not being able to feel (numbness) the area being treated.  Unusual swelling of the area being treated.  Active infections.  Blood clots.  Cancer.  Inability to communicate pain. This may include young children and people who have problems with their brain function (dementia).  Pregnancy. Heat therapy should only be used on old, pre-existing, or long-lasting (chronic) injuries. Do not use heat therapy on new injuries unless directed by your health care provider. HOW TO USE HEAT THERAPY There are several different kinds of heat therapy, including:  Moist heat pack.  Warm water bath.  Hot water bottle.  Electric heating pad.  Heated gel pack.  Heated wrap.  Electric heating pad. Use the heat therapy method suggested by your health care provider. Follow your health care provider's instructions on when and how to use heat therapy. GENERAL HEAT THERAPY RECOMMENDATIONS  Do not sleep while using heat therapy. Only use heat therapy while you are awake.  Your skin may turn pink while using heat therapy. Do not use heat therapy if your skin turns red.  Do not use heat therapy if you have new pain.  High heat or long exposure to heat can cause burns. Be careful when using heat therapy to avoid burning your skin.  Do not use heat therapy on areas of your skin that are already irritated, such as with a rash or sunburn. SEEK MEDICAL CARE IF:  You have blisters, redness, swelling, or numbness.  You have new pain.  Your pain is worse. MAKE SURE  YOU:  Understand these instructions.  Will watch your condition.  Will get help right away if you are not doing well or get worse. Document Released: 09/01/2011 Document Revised: 10/24/2013 Document Reviewed: 08/02/2013 [facility] Patient Information 2015 [place], [place]. This information is not intended to replace advice given to you by your health care provider. Make sure you discuss any questions you have with your health care provider. Muscle Cramps and Spasms Muscle cramps and spasms occur when a muscle or muscles tighten and you have no control over this tightening (involuntary muscle contraction). They are a common problem and can develop in any muscle. The most common place is in the calf muscles of the leg. Both muscle cramps and muscle spasms are involuntary muscle contractions, but they also have differences:   Muscle cramps are sporadic and painful. They may last a few seconds to a quarter of an hour. Muscle cramps are often more forceful and last longer than muscle spasms.  Muscle spasms may or may not be painful. They may also last just a few seconds or much longer. CAUSES  It is uncommon for cramps or spasms to be due to a serious underlying problem. In many cases, the cause of cramps or spasms is unknown. Some common causes are:   Overexertion.   Overuse from repetitive motions (doing the same thing over and over).   Remaining in a certain position for a long period of time.   Improper preparation, form, or technique while performing a sport or activity.   Dehydration.   Injury.  Side effects of some medicines.   Abnormally low levels of the salts and ions in your blood (electrolytes), especially potassium and calcium. This could happen if you are taking water pills (diuretics) or you are pregnant.  Some underlying medical problems can make it more likely to develop cramps or spasms. These include, but are not limited to:   Diabetes.   Parkinson disease.    Hormone disorders, such as thyroid problems.   Alcohol abuse.   Diseases specific to muscles, joints, and bones.   Blood vessel disease where not enough blood is getting to the muscles.  HOME CARE INSTRUCTIONS   Stay well hydrated. Drink enough water and fluids to keep your urine clear or pale yellow.  It may be helpful to massage, stretch, and relax the affected muscle.  For tight or tense muscles, use a warm towel, heating pad, or hot shower water directed to the affected area.  If you are sore or have pain after a cramp or spasm, applying ice to the affected area may relieve discomfort.  Put ice in a plastic bag.  Place a towel between your skin and the bag.  Leave the ice on for 15-20 minutes, 03-04 times a day.  Medicines used to treat a known cause of cramps or spasms may help reduce their frequency or severity. Only take over-the-counter or prescription medicines as directed by your caregiver. SEEK MEDICAL CARE IF:  Your cramps or spasms get more severe, more frequent, or do not improve over time.  MAKE SURE YOU:   Understand these instructions.  Will watch your condition.  Will get help right away if you are not doing well or get worse. Document Released: 11/29/2001 Document Revised: 10/04/2012 Document Reviewed: 05/26/2012 [facility] Patient Information 2015 [place], [place]. This information is not intended to replace advice given to you by your health care provider. Make sure you discuss any questions you have with your health care provider.

## 2015-02-24 DIAGNOSIS — J449 Chronic obstructive pulmonary disease, unspecified: Secondary | ICD-10-CM | POA: Diagnosis not present

## 2015-03-05 ENCOUNTER — Ambulatory Visit (INDEPENDENT_AMBULATORY_CARE_PROVIDER_SITE_OTHER): Payer: Medicare Other | Admitting: Endocrinology

## 2015-03-05 ENCOUNTER — Encounter: Payer: Self-pay | Admitting: Endocrinology

## 2015-03-05 VITALS — BP 130/87 | HR 103 | Temp 98.0°F | Ht 62.0 in | Wt 185.5 lb

## 2015-03-05 DIAGNOSIS — E1142 Type 2 diabetes mellitus with diabetic polyneuropathy: Secondary | ICD-10-CM

## 2015-03-05 LAB — POCT GLYCOSYLATED HEMOGLOBIN (HGB A1C): Hemoglobin A1C: 11.4

## 2015-03-05 MED ORDER — INSULIN LISPRO PROT & LISPRO (75-25 MIX) 100 UNIT/ML KWIKPEN: 90.0000 [IU] | PEN_INJECTOR | Freq: Two times a day (BID) | SUBCUTANEOUS | Status: DC

## 2015-03-05 NOTE — Patient Instructions (Addendum)
check your blood sugar twice a day.  vary the time of day when you check, between before the 3 meals, and at bedtime.  also check if you have symptoms of your blood sugar being too high or too low.  please keep a record of the readings and bring it to your next appointment here.  You can write it on any piece of paper.  please call us sooner if your blood sugar goes below 70, or if you have a lot of readings over 200. Please increase the insulin to 90 units twice a day (with your first and last meals of the day).  Please come back for a follow-up appointment in 2 months.

## 2015-03-05 NOTE — Progress Notes (Signed)
Subjective:    Patient ID: [name], female    DOB: 11/09/1965, 49 y.o.   MRN: [id number]  HPI  Pt returns for f/u of diabetes mellitus: DM type: Insulin-requiring type 2 Dx'ed: 8099 Complications:  Therapy: insulin since 2013 GDM: never DKA: never Severe hypoglycemia: never Pancreatitis: never Other: she has chosen to continue BID insulin.  Interval history: no cbg record, but states cbg's vary from 120-300's.  There is no trend throughout the day.  pt states she feels well in general.   Past Medical History  Diagnosis Date  . Abdominal pain   . Diabetes mellitus     states her doctor took her off all DM meds in past month  . COPD (chronic obstructive pulmonary disease)   . GERD (gastroesophageal reflux disease)   . Chronic nausea   . Hypothyroidism   . Mental disorder     Bipolar and schizophrenic  . Depression   . Headache(784.0)   . Anxiety   . Atrial fibrillation 09/29/11    converted spontaneously  . Chronic pain   . Tobacco abuse   . HTN (hypertension)   . Accidental drug overdose April 2013  . Schizophrenia   . Hyperlipidemia   . Chronic back pain   . Chronic knee pain   . Diabetic neuropathy     Past Surgical History  Procedure Laterality Date  . Laparoscopic assisted vaginal hysterectomy  03/04/2011    Procedure: LAPAROSCOPIC ASSISTED VAGINAL HYSTERECTOMY;  Surgeon: Cyril Mourning, MD;  Location: [facility];  Service: Gynecology;  Laterality: N/A;  . Cystocele repair  03/04/2011    Procedure: ANTERIOR REPAIR (CYSTOCELE);  Surgeon: Reece Packer;  Location: [facility];  Service: Urology;  Laterality: N/A;  . Bladder suspension  03/04/2011    Procedure: [place] PROCEDURE;  Surgeon: Elayne Snare MacDiarmid;  Location: [facility];  Service: Urology;  Laterality: N/A;  . Cystoscopy  03/04/2011    Procedure: CYSTOSCOPY;  Surgeon: Elayne Snare MacDiarmid;  Location: [facility];  Service: Urology;  Laterality: N/A;  . Abdominal hysterectomy      Social History   Social  History  . Marital Status: Legally Separated    Spouse Name: N/A  . Number of Children: N/A  . Years of Education: N/A   Occupational History  . Not on file.   Social History Main Topics  . Smoking status: Former Smoker -- 1.50 packs/day    Types: Cigarettes  . Smokeless tobacco: Not on file  . Alcohol Use: No  . Drug Use: No  . Sexual Activity:    Partners: Male    Birth Control/ Protection: Surgical   Other Topics Concern  . Not on file   Social History Narrative    Current Outpatient Prescriptions on File Prior to Visit  Medication Sig Dispense Refill  . cyclobenzaprine (FLEXERIL) 10 MG tablet Take 1 tablet (10 mg total) by mouth 2 (two) times daily as needed for muscle spasms. 20 tablet 0  . DULoxetine (CYMBALTA) 20 MG capsule Take 20 mg by mouth daily.    . DULoxetine (CYMBALTA) 60 MG capsule Take 60 mg by mouth at bedtime.    . gabapentin (NEURONTIN) 400 MG capsule Take 400 mg by mouth 3 (three) times daily.    Marland Kitchen gabapentin (NEURONTIN) 600 MG tablet     . hydrOXYzine (ATARAX/VISTARIL) 25 MG tablet Take 25 mg by mouth 3 (three) times daily as needed for anxiety.     Marland Kitchen ibuprofen (ADVIL,MOTRIN) 800 MG tablet Take 1  tablet (800 mg total) by mouth 3 (three) times daily. 21 tablet 0  . LATUDA 80 MG TABS tablet Take 80 mg by mouth 2 (two) times daily.    Marland Kitchen levothyroxine (SYNTHROID, LEVOTHROID) 50 MCG tablet Take 50 mcg by mouth daily.  5  . lidocaine (LIDODERM) 5 % Place 1 patch onto the skin daily as needed (for pain.).   2  . LYRICA 75 MG capsule Take 75 mg by mouth 2 (two) times daily.  2  . metFORMIN (GLUCOPHAGE) 500 MG tablet Take 500 mg by mouth 2 (two) times daily with a meal.    . methocarbamol (ROBAXIN) 500 MG tablet Take 2 tablets (1,000 mg total) by mouth 3 (three) times daily. 20 tablet 0  . oxyCODONE-acetaminophen (PERCOCET) 10-325 MG per tablet Take 1 tablet by mouth every 4 (four) hours as needed for pain. 15 tablet 0  . oxymorphone (OPANA ER) 40 MG 12 hr tablet  Take 40 mg by mouth every 8 (eight) hours. For back pain    . SUMATRIPTAN SUCCINATE IJ Inject 0.5 mLs as directed as needed (for migraine).     . topiramate (TOPAMAX) 50 MG tablet Take 150-200 mg by mouth at bedtime.  12  . traZODone (DESYREL) 100 MG tablet Take 200 mg by mouth at bedtime.     No current facility-administered medications on file prior to visit.    Allergies  Allergen Reactions  . Aspirin Nausea And Vomiting    Does fine with ibuprofen    Family History  Problem Relation Age of Onset  . Heart attack Father     31s  . Heart attack Sister     64  . Diabetes Mother   . Heart disease Mother   . Hypertension Mother   . COPD Other     BP 130/87 mmHg  Pulse 103  Temp(Src) 98 F (36.7 C) (Oral)  Ht 5\' 2"  (1.575 m)  Wt 185 lb 8 oz (84.142 kg)  BMI 33.92 kg/m2  SpO2 95%  LMP 01/08/2011  Review of Systems She denies hypoglycemia    Objective:   Physical Exam VITAL SIGNS:  See vs page GENERAL: no distress SKIN:  Insulin injection sites at the anterior abdomen are normal.    A1c=11.4% (was 13 a few mos ago).    Assessment & Plan:  DM: she needs increased rx   Patient is advised the following: Patient Instructions  check your blood sugar twice a day.  vary the time of day when you check, between before the 3 meals, and at bedtime.  also check if you have symptoms of your blood sugar being too high or too low.  please keep a record of the readings and bring it to your next appointment here.  You can write it on any piece of paper.  please call us sooner if your blood sugar goes below 70, or if you have a lot of readings over 200. Please increase the insulin to 90 units twice a day (with your first and last meals of the day).  Please come back for a follow-up appointment in 2 months.

## 2015-03-08 DIAGNOSIS — M25561 Pain in right knee: Secondary | ICD-10-CM | POA: Diagnosis not present

## 2015-03-08 DIAGNOSIS — E669 Obesity, unspecified: Secondary | ICD-10-CM | POA: Diagnosis not present

## 2015-03-08 DIAGNOSIS — M5440 Lumbago with sciatica, unspecified side: Secondary | ICD-10-CM | POA: Diagnosis not present

## 2015-03-14 DIAGNOSIS — J449 Chronic obstructive pulmonary disease, unspecified: Secondary | ICD-10-CM | POA: Diagnosis not present

## 2015-03-22 DIAGNOSIS — E1140 Type 2 diabetes mellitus with diabetic neuropathy, unspecified: Secondary | ICD-10-CM | POA: Diagnosis not present

## 2015-03-22 DIAGNOSIS — Z23 Encounter for immunization: Secondary | ICD-10-CM | POA: Diagnosis not present

## 2015-03-22 DIAGNOSIS — K219 Gastro-esophageal reflux disease without esophagitis: Secondary | ICD-10-CM | POA: Diagnosis not present

## 2015-03-22 DIAGNOSIS — I10 Essential (primary) hypertension: Secondary | ICD-10-CM | POA: Diagnosis not present

## 2015-03-22 DIAGNOSIS — G43909 Migraine, unspecified, not intractable, without status migrainosus: Secondary | ICD-10-CM | POA: Diagnosis not present

## 2015-03-22 DIAGNOSIS — E784 Other hyperlipidemia: Secondary | ICD-10-CM | POA: Diagnosis not present

## 2015-03-26 DIAGNOSIS — J449 Chronic obstructive pulmonary disease, unspecified: Secondary | ICD-10-CM | POA: Diagnosis not present

## 2015-04-13 DIAGNOSIS — J449 Chronic obstructive pulmonary disease, unspecified: Secondary | ICD-10-CM | POA: Diagnosis not present

## 2015-04-26 DIAGNOSIS — J449 Chronic obstructive pulmonary disease, unspecified: Secondary | ICD-10-CM | POA: Diagnosis not present

## 2015-05-07 ENCOUNTER — Ambulatory Visit: Payer: Self-pay | Admitting: Endocrinology

## 2015-05-07 DIAGNOSIS — G894 Chronic pain syndrome: Secondary | ICD-10-CM | POA: Diagnosis not present

## 2015-05-14 DIAGNOSIS — J449 Chronic obstructive pulmonary disease, unspecified: Secondary | ICD-10-CM | POA: Diagnosis not present

## 2015-05-26 DIAGNOSIS — J449 Chronic obstructive pulmonary disease, unspecified: Secondary | ICD-10-CM | POA: Diagnosis not present

## 2015-06-13 DIAGNOSIS — J449 Chronic obstructive pulmonary disease, unspecified: Secondary | ICD-10-CM | POA: Diagnosis not present

## 2015-06-15 DIAGNOSIS — H40033 Anatomical narrow angle, bilateral: Secondary | ICD-10-CM | POA: Diagnosis not present

## 2015-06-15 DIAGNOSIS — H04123 Dry eye syndrome of bilateral lacrimal glands: Secondary | ICD-10-CM | POA: Diagnosis not present

## 2015-07-04 ENCOUNTER — Ambulatory Visit (INDEPENDENT_AMBULATORY_CARE_PROVIDER_SITE_OTHER): Payer: Medicare Other | Admitting: Endocrinology

## 2015-07-04 ENCOUNTER — Encounter: Payer: Self-pay | Admitting: Endocrinology

## 2015-07-04 VITALS — BP 146/82 | HR 114 | Temp 98.6°F | Ht 62.0 in | Wt 216.0 lb

## 2015-07-04 DIAGNOSIS — Z794 Long term (current) use of insulin: Secondary | ICD-10-CM

## 2015-07-04 DIAGNOSIS — E1142 Type 2 diabetes mellitus with diabetic polyneuropathy: Secondary | ICD-10-CM

## 2015-07-04 LAB — POCT GLYCOSYLATED HEMOGLOBIN (HGB A1C): Hemoglobin A1C: 10.1

## 2015-07-04 MED ORDER — INSULIN GLARGINE 100 UNIT/ML SOLOSTAR PEN: 160.0000 [IU] | PEN_INJECTOR | SUBCUTANEOUS | Status: DC

## 2015-07-04 NOTE — Patient Instructions (Addendum)
check your blood sugar twice a day.  vary the time of day when you check, between before the 3 meals, and at bedtime.  also check if you have symptoms of your blood sugar being too high or too low.  please keep a record of the readings and bring it to your next appointment here.  You can write it on any piece of paper.  please call us sooner if your blood sugar goes below 70, or if you have a lot of readings over 200. Please change the insulin to basaglar, 160 units each morning.   On this type of insulin schedule, you should eat meals on a regular schedule.  If a meal is missed or significantly delayed, your blood sugar could go low.  Please see a dietician. Please consider having weight loss surgery.  It is good for your health.  Here is some information about it.  If you decide to consider further, please call the phone number in the papers, and register for a free informational meeting Please come back for a follow-up appointment in 2 months.

## 2015-07-04 NOTE — Progress Notes (Signed)
Subjective:    Patient ID: [name], female    DOB: 12-Apr-1966, 50 y.o.   MRN: [id number]  HPI Pt returns for f/u of diabetes mellitus: DM type: Insulin-requiring type 2 Dx'ed: [id number] Complications: none.   Therapy: insulin since 2013 GDM: never DKA: never Severe hypoglycemia: never. Pancreatitis: never.  Other: she has chosen BID insulin. Interval history: Pt says she often forgets the PM insulin.  no cbg record, but states cbg's are occasionally mildly low in the afternoon.  pt states she feels well in general, except for weight gain.   Past Medical History  Diagnosis Date  . Abdominal pain   . Diabetes mellitus     states her doctor took her off all DM meds in past month  . COPD (chronic obstructive pulmonary disease) ([place])   . GERD (gastroesophageal reflux disease)   . Chronic nausea   . Hypothyroidism   . Mental disorder     Bipolar and schizophrenic  . Depression   . Headache(784.0)   . Anxiety   . Atrial fibrillation ([place]) 09/29/11    converted spontaneously  . Chronic pain   . Tobacco abuse   . HTN (hypertension)   . Accidental drug overdose April 2013  . Schizophrenia ([place])   . Hyperlipidemia   . Chronic back pain   . Chronic knee pain   . Diabetic neuropathy [facility])     Past Surgical History  Procedure Laterality Date  . Laparoscopic assisted vaginal hysterectomy  03/04/2011    Procedure: LAPAROSCOPIC ASSISTED VAGINAL HYSTERECTOMY;  Surgeon: Cyril Mourning, MD;  Location: [facility];  Service: Gynecology;  Laterality: N/A;  . Cystocele repair  03/04/2011    Procedure: ANTERIOR REPAIR (CYSTOCELE);  Surgeon: Reece Packer;  Location: [facility];  Service: Urology;  Laterality: N/A;  . Bladder suspension  03/04/2011    Procedure: [place] PROCEDURE;  Surgeon: Elayne Snare MacDiarmid;  Location: [facility];  Service: Urology;  Laterality: N/A;  . Cystoscopy  03/04/2011    Procedure: CYSTOSCOPY;  Surgeon: Elayne Snare MacDiarmid;  Location: [facility];  Service: Urology;   Laterality: N/A;  . Abdominal hysterectomy      Social History   Social History  . Marital Status: Legally Separated    Spouse Name: N/A  . Number of Children: N/A  . Years of Education: N/A   Occupational History  . Not on file.   Social History Main Topics  . Smoking status: Former Smoker -- 1.50 packs/day    Types: Cigarettes  . Smokeless tobacco: Not on file  . Alcohol Use: No  . Drug Use: No  . Sexual Activity:    Partners: Male    Birth Control/ Protection: Surgical   Other Topics Concern  . Not on file   Social History Narrative    Current Outpatient Prescriptions on File Prior to Visit  Medication Sig Dispense Refill  . DULoxetine (CYMBALTA) 20 MG capsule Take 20 mg by mouth daily.    . DULoxetine (CYMBALTA) 60 MG capsule Take 60 mg by mouth at bedtime.    . gabapentin (NEURONTIN) 400 MG capsule Take 400 mg by mouth 3 (three) times daily.    . hydrOXYzine (ATARAX/VISTARIL) 25 MG tablet Take 25 mg by mouth 3 (three) times daily as needed for anxiety.     Marland Kitchen ibuprofen (ADVIL,MOTRIN) 800 MG tablet Take 1 tablet (800 mg total) by mouth 3 (three) times daily. 21 tablet 0  . LATUDA 80 MG TABS tablet Take 80 mg by mouth  2 (two) times daily.    Marland Kitchen levothyroxine (SYNTHROID, LEVOTHROID) 50 MCG tablet Take 50 mcg by mouth daily.  5  . lidocaine (LIDODERM) 5 % Place 1 patch onto the skin daily as needed (for pain.).   2  . LYRICA 75 MG capsule Take 75 mg by mouth 2 (two) times daily.  2  . metFORMIN (GLUCOPHAGE) 500 MG tablet Take 500 mg by mouth 2 (two) times daily with a meal.    . oxyCODONE-acetaminophen (PERCOCET) 10-325 MG per tablet Take 1 tablet by mouth every 4 (four) hours as needed for pain. 15 tablet 0  . oxymorphone (OPANA ER) 40 MG 12 hr tablet Take 40 mg by mouth every 8 (eight) hours. For back pain    . simvastatin (ZOCOR) 20 MG tablet Take 20 mg by mouth daily.  5  . SUMATRIPTAN SUCCINATE IJ Inject 0.5 mLs as directed as needed (for migraine).     . traZODone  (DESYREL) 100 MG tablet Take 200 mg by mouth at bedtime.    . cyclobenzaprine (FLEXERIL) 10 MG tablet Take 1 tablet (10 mg total) by mouth 2 (two) times daily as needed for muscle spasms. (Patient not taking: Reported on 07/04/2015) 20 tablet 0  . diltiazem (CARDIZEM CD) 240 MG 24 hr capsule Take 240 mg by mouth daily. Reported on 07/04/2015  5  . methocarbamol (ROBAXIN) 500 MG tablet Take 2 tablets (1,000 mg total) by mouth 3 (three) times daily. (Patient not taking: Reported on 07/04/2015) 20 tablet 0  . topiramate (TOPAMAX) 50 MG tablet Take 150-200 mg by mouth at bedtime. Reported on 07/04/2015  12   No current facility-administered medications on file prior to visit.    Allergies  Allergen Reactions  . Aspirin Nausea And Vomiting    Does fine with ibuprofen    Family History  Problem Relation Age of Onset  . Heart attack Father     67s  . Heart attack Sister     41  . Diabetes Mother   . Heart disease Mother   . Hypertension Mother   . COPD Other     BP 146/82 mmHg  Pulse 114  Temp(Src) 98.6 F (37 C) (Oral)  Ht 5\' 2"  (1.575 m)  Wt 216 lb (97.977 kg)  BMI 39.50 kg/m2  SpO2 96%  LMP 01/08/2011  Review of Systems She denies LOC    Objective:   Physical Exam VITAL SIGNS:  See vs page GENERAL: no distress Pulses: dorsalis pedis intact bilat.   MSK: no deformity of the feet CV: no leg edema Skin:  no ulcer on the feet.  normal color and temp on the feet. Neuro: sensation is intact to touch on the feet.    A1c=10.1%    Assessment & Plan:  DM: severe exacerbation. Noncompliance with cbg recording and insulin, worse: I'll work around this as best I can. Obesity: persistent.  Patient is advised the following: Patient Instructions  check your blood sugar twice a day.  vary the time of day when you check, between before the 3 meals, and at bedtime.  also check if you have symptoms of your blood sugar being too high or too low.  please keep a record of the readings  and bring it to your next appointment here.  You can write it on any piece of paper.  please call us sooner if your blood sugar goes below 70, or if you have a lot of readings over 200. Please change the insulin to basaglar,  160 units each morning.   On this type of insulin schedule, you should eat meals on a regular schedule.  If a meal is missed or significantly delayed, your blood sugar could go low.  Please see a dietician. Please consider having weight loss surgery.  It is good for your health.  Here is some information about it.  If you decide to consider further, please call the phone number in the papers, and register for a free informational meeting Please come back for a follow-up appointment in 2 months.

## 2015-07-09 ENCOUNTER — Encounter (HOSPITAL_COMMUNITY): Payer: Self-pay | Admitting: Family Medicine

## 2015-07-09 ENCOUNTER — Emergency Department (HOSPITAL_COMMUNITY)
Admission: EM | Admit: 2015-07-09 | Discharge: 2015-07-09 | Disposition: A | Discharge status: Home / Self Care | Payer: Medicare Other | Attending: Emergency Medicine | Admitting: Emergency Medicine

## 2015-07-09 DIAGNOSIS — J449 Chronic obstructive pulmonary disease, unspecified: Secondary | ICD-10-CM | POA: Insufficient documentation

## 2015-07-09 DIAGNOSIS — E1140 Type 2 diabetes mellitus with diabetic neuropathy, unspecified: Secondary | ICD-10-CM | POA: Insufficient documentation

## 2015-07-09 DIAGNOSIS — Z8719 Personal history of other diseases of the digestive system: Secondary | ICD-10-CM | POA: Diagnosis not present

## 2015-07-09 DIAGNOSIS — I10 Essential (primary) hypertension: Secondary | ICD-10-CM | POA: Insufficient documentation

## 2015-07-09 DIAGNOSIS — M549 Dorsalgia, unspecified: Secondary | ICD-10-CM

## 2015-07-09 DIAGNOSIS — Z7984 Long term (current) use of oral hypoglycemic drugs: Secondary | ICD-10-CM | POA: Diagnosis not present

## 2015-07-09 DIAGNOSIS — E785 Hyperlipidemia, unspecified: Secondary | ICD-10-CM | POA: Diagnosis not present

## 2015-07-09 DIAGNOSIS — F209 Schizophrenia, unspecified: Secondary | ICD-10-CM | POA: Insufficient documentation

## 2015-07-09 DIAGNOSIS — G8929 Other chronic pain: Secondary | ICD-10-CM | POA: Insufficient documentation

## 2015-07-09 DIAGNOSIS — I4891 Unspecified atrial fibrillation: Secondary | ICD-10-CM | POA: Insufficient documentation

## 2015-07-09 DIAGNOSIS — M5440 Lumbago with sciatica, unspecified side: Secondary | ICD-10-CM | POA: Diagnosis not present

## 2015-07-09 DIAGNOSIS — M25561 Pain in right knee: Secondary | ICD-10-CM | POA: Diagnosis not present

## 2015-07-09 DIAGNOSIS — E039 Hypothyroidism, unspecified: Secondary | ICD-10-CM | POA: Diagnosis not present

## 2015-07-09 DIAGNOSIS — M5441 Lumbago with sciatica, right side: Secondary | ICD-10-CM

## 2015-07-09 DIAGNOSIS — Z791 Long term (current) use of non-steroidal anti-inflammatories (NSAID): Secondary | ICD-10-CM | POA: Insufficient documentation

## 2015-07-09 DIAGNOSIS — F419 Anxiety disorder, unspecified: Secondary | ICD-10-CM | POA: Diagnosis not present

## 2015-07-09 DIAGNOSIS — F319 Bipolar disorder, unspecified: Secondary | ICD-10-CM | POA: Diagnosis not present

## 2015-07-09 DIAGNOSIS — Z79899 Other long term (current) drug therapy: Secondary | ICD-10-CM | POA: Insufficient documentation

## 2015-07-09 DIAGNOSIS — M545 Low back pain: Secondary | ICD-10-CM | POA: Diagnosis present

## 2015-07-09 DIAGNOSIS — Z87891 Personal history of nicotine dependence: Secondary | ICD-10-CM | POA: Diagnosis not present

## 2015-07-09 DIAGNOSIS — M5442 Lumbago with sciatica, left side: Secondary | ICD-10-CM

## 2015-07-09 MED ORDER — ONDANSETRON 4 MG PO TBDP
4.0000 mg | ORAL_TABLET | Freq: Once | ORAL | Status: AC | PRN
  Administered 2015-07-09: 4 mg via ORAL
  Filled 2015-07-09: qty 1

## 2015-07-09 MED ORDER — PREDNISONE 20 MG PO TABS
60.0000 mg | ORAL_TABLET | Freq: Once | ORAL | Status: AC
  Administered 2015-07-09: 60 mg via ORAL
  Filled 2015-07-09: qty 3

## 2015-07-09 MED ORDER — LIDOCAINE 5 % EX PTCH: 1.0000 | MEDICATED_PATCH | CUTANEOUS | Status: DC

## 2015-07-09 MED ORDER — KETOROLAC TROMETHAMINE 60 MG/2ML IM SOLN
60.0000 mg | Freq: Once | INTRAMUSCULAR | Status: AC
  Administered 2015-07-09: 60 mg via INTRAMUSCULAR
  Filled 2015-07-09: qty 2

## 2015-07-09 MED ORDER — HYDROMORPHONE HCL 1 MG/ML IJ SOLN
1.0000 mg | Freq: Once | INTRAMUSCULAR | Status: AC
  Administered 2015-07-09: 1 mg via INTRAMUSCULAR
  Filled 2015-07-09: qty 1

## 2015-07-09 MED ORDER — PREDNISONE 20 MG PO TABS: 40.0000 mg | ORAL_TABLET | Freq: Every day | ORAL | Status: DC

## 2015-07-09 NOTE — ED Provider Notes (Signed)
CSN: [id number]     Arrival date & time 07/09/15  0515 History   First MD Initiated Contact with Patient 07/09/15 0645     Chief Complaint  Patient presents with  . Back Pain     (Consider location/radiation/quality/duration/timing/severity/associated sxs/prior Treatment) HPI   [name] is a 50 y.o. female, with a history of chronic lower back and right knee pain, presenting to the ED with bilateral lower back pain and right knee pain. Pt is a chronic pain management patient with [facility], under Dr. Vilinda Flake, and has a pain contract. Pt states she is out of her 40 mg opana and 10/325 mg Percocet since yesterday. Pt states she has refills of her medications waiting at the pharmacy, but she has to have prior authorization and her pain clinic is closed today. Pt rates all pain at 10/10, achy in the back, throbbing in the right knee, nonradiating in both areas. Pt adds that she is supposed to have a knee replacement at some point and normally ambulates with a cane due to the knee pain. Pt denies fever/chills, neuro deficits, N/V, abdominal pain, difficulty urinating, or any other complaints.     Past Medical History  Diagnosis Date  . Abdominal pain   . Diabetes mellitus     states her doctor took her off all DM meds in past month  . COPD (chronic obstructive pulmonary disease) ([place])   . GERD (gastroesophageal reflux disease)   . Chronic nausea   . Hypothyroidism   . Mental disorder     Bipolar and schizophrenic  . Depression   . Headache(784.0)   . Anxiety   . Atrial fibrillation ([place]) 09/29/11    converted spontaneously  . Chronic pain   . Tobacco abuse   . HTN (hypertension)   . Accidental drug overdose April 2013  . Schizophrenia ([place])   . Hyperlipidemia   . Chronic back pain   . Chronic knee pain   . Diabetic neuropathy [facility])    Past Surgical History  Procedure Laterality Date  . Laparoscopic assisted vaginal hysterectomy  03/04/2011    Procedure: LAPAROSCOPIC ASSISTED VAGINAL HYSTERECTOMY;  Surgeon: Cyril Mourning, MD;  Location: [facility];  Service: Gynecology;  Laterality: N/A;  . Cystocele repair  03/04/2011    Procedure: ANTERIOR REPAIR (CYSTOCELE);  Surgeon: Reece Packer;  Location: [facility];  Service: Urology;  Laterality: N/A;  . Bladder suspension  03/04/2011    Procedure: [place] PROCEDURE;  Surgeon: Elayne Snare MacDiarmid;  Location: [facility];  Service: Urology;  Laterality: N/A;  . Cystoscopy  03/04/2011    Procedure: CYSTOSCOPY;  Surgeon: Elayne Snare MacDiarmid;  Location: [facility];  Service: Urology;  Laterality: N/A;  . Abdominal hysterectomy     Family History  Problem Relation Age of Onset  . Heart attack Father     50s  . Heart attack Sister     15  . Diabetes Mother   . Heart disease Mother   . Hypertension Mother   . COPD Other    Social History  Substance Use Topics  . Smoking status: Former Research scientist (life sciences)  . Smokeless tobacco: None  . Alcohol Use: No   OB History    Gravida Para Term Preterm AB TAB SAB Ectopic Multiple Living   3 3   0 0 0 0 0 3     Review of Systems  Respiratory: Negative for shortness of breath.   Cardiovascular: Negative for chest pain.  Gastrointestinal:  Negative for nausea, vomiting and abdominal pain.  Genitourinary: Negative for dysuria, hematuria, flank pain and pelvic pain.  Musculoskeletal: Positive for back pain and arthralgias (Right knee).  Neurological: Negative for dizziness, syncope, weakness, light-headedness, numbness and headaches.  All other systems reviewed and are negative.     Allergies  Aspirin  Home Medications   Prior to Admission medications   Medication Sig Start Date End Date Taking? Authorizing Provider  clonazePAM (KLONOPIN) 0.5 MG tablet Take 0.5 mg by mouth 2 (two) times daily as needed. For anxiety 06/30/15  Yes Historical Provider, MD  cyclobenzaprine (FLEXERIL) 10 MG tablet Take 1 tablet (10 mg total) by mouth 2 (two) times daily as needed for  muscle spasms. 11/04/14  Yes Noland Fordyce, PA-C  DULoxetine (CYMBALTA) 60 MG capsule Take 60 mg by mouth at bedtime.   Yes Historical Provider, MD  gabapentin (NEURONTIN) 400 MG capsule Take 400 mg by mouth 3 (three) times daily. 11/06/14  Yes Historical Provider, MD  HUMALOG MIX 75/25 KWIKPEN (75-25) 100 UNIT/ML Kwikpen Inject 160 Units into the skin daily. 04/03/15  Yes Historical Provider, MD  hydrOXYzine (ATARAX/VISTARIL) 25 MG tablet Take 25 mg by mouth 3 (three) times daily as needed for anxiety.  11/07/14  Yes Historical Provider, MD  ibuprofen (ADVIL,MOTRIN) 800 MG tablet Take 1 tablet (800 mg total) by mouth 3 (three) times daily. 02/11/15  Yes Shari Upstill, PA-C  LATUDA 60 MG TABS Take 60 mg by mouth daily. 06/30/15  Yes Historical Provider, MD  levothyroxine (SYNTHROID, LEVOTHROID) 50 MCG tablet Take 50 mcg by mouth daily. 10/10/14  Yes Historical Provider, MD  lidocaine (LIDODERM) 5 % Place 1 patch onto the skin daily as needed (for pain.).  10/14/14  Yes Historical Provider, MD  LYRICA 75 MG capsule Take 75 mg by mouth 2 (two) times daily. 11/17/14  Yes Historical Provider, MD  metFORMIN (GLUCOPHAGE) 500 MG tablet Take 500 mg by mouth 2 (two) times daily with a meal.   Yes Historical Provider, MD  OPANA ER, CRUSH RESISTANT, 40 MG T12A Take 40 mg by mouth every 8 (eight) hours. 07/06/15  Yes Historical Provider, MD  oxyCODONE-acetaminophen (PERCOCET) 10-325 MG per tablet Take 1 tablet by mouth every 4 (four) hours as needed for pain. 12/02/14  Yes Pamella Pert, MD  PROAIR HFA 108 [phone] Base) MCG/ACT inhaler Inhale 2 puffs into the lungs every 6 (six) hours as needed. 06/15/15  Yes Historical Provider, MD  RESTASIS 0.05 % ophthalmic emulsion PLACE 1 DROP IN [facility] EYE TWICE A DAY 06/27/15  Yes Historical Provider, MD  simvastatin (ZOCOR) 20 MG tablet Take 20 mg by mouth daily. 02/08/15  Yes Historical Provider, MD  SUMAtriptan 6 MG/0.5ML SOAJ Inject 6 mg into the muscle daily as needed. For headaches.  06/09/15  Yes Historical Provider, MD  topiramate (TOPAMAX) 50 MG tablet Take 150-200 mg by mouth at bedtime. Reported on 07/04/2015 10/10/14  Yes Historical Provider, MD  traZODone (DESYREL) 100 MG tablet Take 200 mg by mouth at bedtime.   Yes Robbie Lis, MD  Insulin Glargine [facility]) 100 UNIT/ML Solostar Pen Inject 160 Units into the skin every morning. And pen needles 2/day Patient not taking: Reported on 07/09/2015 07/04/15   Renato Shin, MD  lidocaine (LIDODERM) 5 % Place 1 patch onto the skin daily. Remove & Discard patch within 12 hours or as directed by MD 07/09/15   Lorayne Bender, PA-C  methocarbamol (ROBAXIN) 500 MG tablet Take 2 tablets (1,000 mg total) by  mouth 3 (three) times daily. Patient not taking: Reported on 07/09/2015 02/11/15   Charlann Lange, PA-C  prednisoLONE acetate (PRED FORTE) 1 % ophthalmic suspension Place 1 drop into both eyes 2 (two) times daily. Not started yet 07/05/15   Historical Provider, MD   BP 135/97 mmHg  Pulse 78  Temp(Src) 98.2 F (36.8 C) (Oral)  Resp 18  Ht 5\' 2"  (1.575 m)  Wt 102.059 kg  BMI 41.14 kg/m2  SpO2 97%  LMP 01/08/2011 Physical Exam  Constitutional: She is oriented to person, place, and time. She appears well-developed and well-nourished. No distress.  HENT:  Head: Normocephalic and atraumatic.  Eyes: Conjunctivae and EOM are normal. Pupils are equal, round, and reactive to light.  Neck: Normal range of motion. Neck supple.  Cardiovascular: Normal rate, regular rhythm and normal heart sounds.   Pulmonary/Chest: Effort normal and breath sounds normal. No respiratory distress.  Abdominal: Soft. Bowel sounds are normal.  Musculoskeletal: She exhibits no edema or tenderness.  Full ROM in all extremities and spine. No paraspinal tenderness.   Neurological: She is alert and oriented to person, place, and time. She has normal reflexes.  No sensory deficits. Strength 5/5 in all extremities. Patient can ambulate with the help of a cane.  Coordination intact. Cranial nerves III-XII grossly intact. No facial droop.   Skin: Skin is warm and dry. She is not diaphoretic.  Nursing note and vitals reviewed.   ED Course  Procedures (including critical care time)   MDM   Final diagnoses:  Bilateral low back pain with sciatica, sciatica laterality unspecified  Right knee pain  Chronic back pain    [name] presents with acute on chronic back and knee pain for the last 24 hours.  Patient has no neurologic deficits and no red flag symptoms. Patient's presentation is consistent with acute on chronic pain. Patient denies trauma or injury and has no findings that would indicate the need for imaging here in the ED. Pt was told that her pain can be somewhat managed here, but that she would not necessarily be pain free upon discharge and would not be getting any prescriptions for pain medication. Patient voiced understanding of this information and agreed to it. Upon reassessment, patient states that her pain has significantly improved. Patient states that she feels that she is finally go home. Patient appears safe for discharge at this time. Patient was given instructions for home care as well as return precautions. Patient voiced understanding of these instructions, accepts the plan, and is comfortable with discharge.  Filed Vitals:   07/09/15 0538 07/09/15 0806  BP: 143/111 135/97  Pulse: 91 78  Temp: 98.2 F (36.8 C)   TempSrc: Oral   Resp: 19 18  Height: 5\' 2"  (1.575 m)   Weight: 102.059 kg   SpO2: 95% 97%     Lorayne Bender, PA-C 07/09/15 [place], MD 07/09/15 1650

## 2015-07-09 NOTE — ED Notes (Signed)
PA at bedside.

## 2015-07-09 NOTE — ED Notes (Signed)
Pt is complaining of right lower back pain. Also, right knee is painful. Reports last week she fell when her knee "gave out. on me" Pt is ambulating with a cane since the fall.

## 2015-07-09 NOTE — Discharge Instructions (Signed)
You have been seen today for back and knee pain. Follow-up with the pain management clinic as soon as possible for this matter. Follow up with PCP as needed. Return to ED should symptoms worsen.

## 2015-07-09 NOTE — ED Notes (Addendum)
Patient complaining of chronic pain and chronic back pain. Patient says her insurance is in the process of getting her medication approve due to medicare formulary. Patient is also in a pain clinic. Patient does not have any pain medications and all her clinics are close.

## 2015-07-28 ENCOUNTER — Encounter (HOSPITAL_COMMUNITY): Payer: Self-pay | Admitting: *Deleted

## 2015-07-28 ENCOUNTER — Emergency Department (INDEPENDENT_AMBULATORY_CARE_PROVIDER_SITE_OTHER): Payer: Medicare Other

## 2015-07-28 ENCOUNTER — Emergency Department (INDEPENDENT_AMBULATORY_CARE_PROVIDER_SITE_OTHER)
Admission: EM | Admit: 2015-07-28 | Discharge: 2015-07-28 | Disposition: A | Discharge status: Home / Self Care | Payer: Medicare Other | Source: Home / Self Care | Attending: Emergency Medicine | Admitting: Emergency Medicine

## 2015-07-28 DIAGNOSIS — M25461 Effusion, right knee: Secondary | ICD-10-CM

## 2015-07-28 DIAGNOSIS — M1711 Unilateral primary osteoarthritis, right knee: Secondary | ICD-10-CM

## 2015-07-28 DIAGNOSIS — M25561 Pain in right knee: Secondary | ICD-10-CM

## 2015-07-28 MED ORDER — DICLOFENAC SODIUM 50 MG PO TBEC: 50.0000 mg | DELAYED_RELEASE_TABLET | Freq: Three times a day (TID) | ORAL | Status: DC | PRN

## 2015-07-28 MED ORDER — METHYLPREDNISOLONE ACETATE 40 MG/ML IJ SUSP
INTRAMUSCULAR | Status: AC
  Filled 2015-07-28: qty 1

## 2015-07-28 MED ORDER — LIDOCAINE HCL (PF) 2 % IJ SOLN
INTRAMUSCULAR | Status: AC
  Filled 2015-07-28: qty 2

## 2015-07-28 NOTE — ED Notes (Signed)
C/O right knee pain x 1 wk; started intermittently when she stood up with sudden pain.  2 mo ago had a fall with right knee pain, which was improving; has hx 2 right knee surgeries 2015.  States called Dr. Alvan Dame twice this week without a call back, but was told she is scheduled for a FCE test in 2 days, and was told a request for pain management was sent per office.  Has been taking Tylenol & IBU and applying ice; is ambulating with a cane due to pain.

## 2015-07-28 NOTE — Discharge Instructions (Signed)
You have pretty bad arthritis in your knee. I am also worried that you tore your meniscus. We did a cortisone injection today. Please continue to ice here. Take diclofenac 3 times a day as needed for pain. Please follow-up with your orthopedic doctor in the next few weeks.

## 2015-07-28 NOTE — ED Provider Notes (Signed)
CSN: [id number]     Arrival date & time 07/28/15  1401 History   First MD Initiated Contact with Patient 07/28/15 1449     Chief Complaint  Patient presents with  . Knee Pain   (Consider location/radiation/quality/duration/timing/severity/associated sxs/prior Treatment) HPI  She is a 50 year old woman here for evaluation of right knee pain. She states she had an injury to her knee several years ago for which she had 2 surgeries. She states she had been doing well up until about a week ago. At that time she describes standing up and feeling a pain in the medial part of her knee. She did her water aerobics class and by the end of class her knee was quite painful and swollen. She has been applying ice as well as taking ibuprofen and Tylenol without much improvement. She states the swelling has gone down some. Pain is worse with weightbearing. She is currently ambulating with a cane.  She has tried calling her orthopedic doctor, but has not gotten a call back.  Past Medical History  Diagnosis Date  . Abdominal pain   . Diabetes mellitus     states her doctor took her off all DM meds in past month  . COPD (chronic obstructive pulmonary disease) ([place])   . GERD (gastroesophageal reflux disease)   . Chronic nausea   . Hypothyroidism   . Mental disorder     Bipolar and schizophrenic  . Depression   . Headache(784.0)   . Anxiety   . Atrial fibrillation ([place]) 09/29/11    converted spontaneously  . Chronic pain   . Tobacco abuse   . HTN (hypertension)   . Accidental drug overdose April 2013  . Schizophrenia ([place])   . Hyperlipidemia   . Chronic back pain   . Chronic knee pain   . Diabetic neuropathy [facility])    Past Surgical History  Procedure Laterality Date  . Laparoscopic assisted vaginal hysterectomy  03/04/2011    Procedure: LAPAROSCOPIC ASSISTED VAGINAL HYSTERECTOMY;  Surgeon: Cyril Mourning, MD;  Location: [facility];  Service: Gynecology;  Laterality: N/A;  . Cystocele repair  03/04/2011     Procedure: ANTERIOR REPAIR (CYSTOCELE);  Surgeon: Reece Packer;  Location: [facility];  Service: Urology;  Laterality: N/A;  . Bladder suspension  03/04/2011    Procedure: [place] PROCEDURE;  Surgeon: Elayne Snare MacDiarmid;  Location: [facility];  Service: Urology;  Laterality: N/A;  . Cystoscopy  03/04/2011    Procedure: CYSTOSCOPY;  Surgeon: Elayne Snare MacDiarmid;  Location: [facility];  Service: Urology;  Laterality: N/A;  . Abdominal hysterectomy    . Knee surgery     Family History  Problem Relation Age of Onset  . Heart attack Father     67s  . Heart attack Sister     80  . Diabetes Mother   . Heart disease Mother   . Hypertension Mother   . COPD Other    Social History  Substance Use Topics  . Smoking status: Former Smoker    Quit date: 07/24/2013  . Smokeless tobacco: None  . Alcohol Use: No   OB History    Gravida Para Term Preterm AB TAB SAB Ectopic Multiple Living   3 3   0 0 0 0 0 3     Review of Systems As in history of present illness Allergies  Aspirin  Home Medications   Prior to Admission medications   Medication Sig Start Date End Date Taking? Authorizing Provider  clonazePAM (KLONOPIN) 0.5  MG tablet Take 0.5 mg by mouth 2 (two) times daily as needed. For anxiety 06/30/15  Yes Historical Provider, MD  DULoxetine (CYMBALTA) 60 MG capsule Take 60 mg by mouth at bedtime.   Yes Historical Provider, MD  gabapentin (NEURONTIN) 400 MG capsule Take 400 mg by mouth 3 (three) times daily. 11/06/14  Yes Historical Provider, MD  hydrOXYzine (ATARAX/VISTARIL) 25 MG tablet Take 25 mg by mouth 3 (three) times daily as needed for anxiety.  11/07/14  Yes Historical Provider, MD  ibuprofen (ADVIL,MOTRIN) 800 MG tablet Take 1 tablet (800 mg total) by mouth 3 (three) times daily. 02/11/15  Yes Shari Upstill, PA-C  Insulin Glargine (BASAGLAR KWIKPEN) 100 UNIT/ML Solostar Pen Inject 160 Units into the skin every morning. And pen needles 2/day 07/04/15  Yes Renato Shin, MD  LATUDA 60 MG TABS  Take 60 mg by mouth daily. 06/30/15  Yes Historical Provider, MD  levothyroxine (SYNTHROID, LEVOTHROID) 50 MCG tablet Take 50 mcg by mouth daily. 10/10/14  Yes Historical Provider, MD  LYRICA 75 MG capsule Take 75 mg by mouth 2 (two) times daily. 11/17/14  Yes Historical Provider, MD  metFORMIN (GLUCOPHAGE) 500 MG tablet Take 500 mg by mouth 2 (two) times daily with a meal.   Yes Historical Provider, MD  OPANA ER, CRUSH RESISTANT, 40 MG T12A Take 40 mg by mouth every 8 (eight) hours. 07/06/15  Yes Historical Provider, MD  oxyCODONE-acetaminophen (PERCOCET) 10-325 MG per tablet Take 1 tablet by mouth every 4 (four) hours as needed for pain. 12/02/14  Yes Pamella Pert, MD  prednisoLONE acetate (PRED FORTE) 1 % ophthalmic suspension Place 1 drop into both eyes 2 (two) times daily. Not started yet 07/05/15  Yes Historical Provider, MD  predniSONE (DELTASONE) 20 MG tablet Take 2 tablets (40 mg total) by mouth daily. 07/09/15  Yes Shawn C Joy, PA-C  PROAIR HFA 108 (90 Base) MCG/ACT inhaler Inhale 2 puffs into the lungs every 6 (six) hours as needed. 06/15/15  Yes Historical Provider, MD  RESTASIS 0.05 % ophthalmic emulsion PLACE 1 DROP IN [facility] EYE TWICE A DAY 06/27/15  Yes Historical Provider, MD  simvastatin (ZOCOR) 20 MG tablet Take 20 mg by mouth daily. 02/08/15  Yes Historical Provider, MD  SUMAtriptan 6 MG/0.5ML SOAJ Inject 6 mg into the muscle daily as needed. For headaches. 06/09/15  Yes Historical Provider, MD  traZODone (DESYREL) 100 MG tablet Take 200 mg by mouth at bedtime.   Yes Robbie Lis, MD  diclofenac (VOLTAREN) 50 MG EC tablet Take 1 tablet (50 mg total) by mouth 3 (three) times daily as needed for moderate pain. 07/28/15   Melony Overly, MD  lidocaine (LIDODERM) 5 % Place 1 patch onto the skin daily as needed (for pain.).  10/14/14   Historical Provider, MD  lidocaine (LIDODERM) 5 % Place 1 patch onto the skin daily. Remove & Discard patch within 12 hours or as directed by MD 07/09/15   Lorayne Bender, PA-C  topiramate (TOPAMAX) 50 MG tablet Take 150-200 mg by mouth at bedtime. Reported on 07/04/2015 10/10/14   Historical Provider, MD   Meds Ordered and Administered this Visit  Medications - No data to display  BP 126/89 mmHg  Pulse 111  Temp(Src) 97.6 F (36.4 C) (Oral)  SpO2 97%  LMP 01/08/2011 No data found.   Physical Exam  Constitutional: She is oriented to person, place, and time. She appears well-developed and well-nourished. No distress.  Pulmonary/Chest: Effort normal.  Musculoskeletal:  Right  knee: The knee does appear swollen. No erythema. She does have a mild joint effusion. She has diffuse tenderness, but worse along the medial joint line. No joint laxity. Positive McMurray's.  Neurological: She is alert and oriented to person, place, and time.    ED Course  Injection of joint Date/Time: 07/28/2015 4:13 PM Performed by: Melony Overly Authorized by: Melony Overly Consent: Verbal consent obtained. Risks and benefits: risks, benefits and alternatives were discussed Consent given by: patient Patient understanding: patient states understanding of the procedure being performed Patient identity confirmed: verbally with patient Time out: Immediately prior to procedure a "time out" was called to verify the correct patient, procedure, equipment, support staff and site/side marked as required. Comments: Skin was prepped with alcohol. Cold spray was used for local anesthetic. 40 mg Depo-Medrol with 4 mL of 2% lidocaine was injected into the right knee. Patient tolerated procedure well with no immediate complication.   (including critical care time)  Labs Review Labs Reviewed - No data to display  Imaging Review Dg Knee Complete 4 Views Right  07/28/2015  CLINICAL DATA:  Patient with injury to the right knee doing water aerobics. EXAM: RIGHT KNEE - COMPLETE 4+ VIEW COMPARISON:  Knee radiograph 11/04/2014 FINDINGS: Tricompartmental osteophytosis. Normal anatomic alignment.  No evidence for acute fracture dislocation. Small joint effusion. IMPRESSION: Tricompartmental osteoarthritis. No acute osseous abnormality.  Small joint effusion. Electronically Signed   By: Lovey Newcomer M.D.   On: 07/28/2015 15:58      MDM   1. Right knee pain   2. Knee swelling, right   3. Primary osteoarthritis of right knee    Cortisone injection done today. Recommended continued icing. Diclofenac 3 times a day as needed for pain. Follow-up with orthopedic physician.    Melony Overly, MD 07/28/15 [phone]

## 2015-08-30 ENCOUNTER — Other Ambulatory Visit (HOSPITAL_COMMUNITY): Payer: Self-pay | Admitting: General Surgery

## 2015-09-05 NOTE — Patient Instructions (Addendum)
check your blood sugar twice a day.  vary the time of day when you check, between before the 3 meals, and at bedtime.  also check if you have symptoms of your blood sugar being too high or too low.  please keep a record of the readings and bring it to your next appointment here.  You can write it on any piece of paper.  please call us sooner if your blood sugar goes below 70, or if you have a lot of readings over 200. Please change the basaglar to lantus, 160 units each morning.   On this type of insulin schedule, you should eat meals on a regular schedule.  If a meal is missed or significantly delayed, your blood sugar could go low.  Please come back for a follow-up appointment in 2 months, or sooner if you have the surgery.

## 2015-09-05 NOTE — Progress Notes (Signed)
Subjective:    Patient ID: [name], female    DOB: 1966-06-11, 50 y.o.   MRN: [id number]  HPI Pt returns for f/u of diabetes mellitus: DM type: Insulin-requiring type 2 Dx'ed: [id number] Complications: none.   Therapy: insulin since 2013 GDM: never DKA: never Severe hypoglycemia: never. Pancreatitis: never.  Other: she takes QD insulin, due to forgetting more frequent doses.  Interval history:  no cbg record, but states cbg's vary from 50-200.  It is lowest in the afternoon.  This happens after she misses lunch.  She will have bariatric surgery soon.   Past Medical History  Diagnosis Date  . Abdominal pain   . Diabetes mellitus     states her doctor took her off all DM meds in past month  . COPD (chronic obstructive pulmonary disease) ([place])   . GERD (gastroesophageal reflux disease)   . Chronic nausea   . Hypothyroidism   . Mental disorder     Bipolar and schizophrenic  . Depression   . Headache(784.0)   . Anxiety   . Atrial fibrillation ([place]) 09/29/11    converted spontaneously  . Chronic pain   . Tobacco abuse   . HTN (hypertension)   . Accidental drug overdose April 2013  . Schizophrenia ([place])   . Hyperlipidemia   . Chronic back pain   . Chronic knee pain   . Diabetic neuropathy [facility])     Past Surgical History  Procedure Laterality Date  . Laparoscopic assisted vaginal hysterectomy  03/04/2011    Procedure: LAPAROSCOPIC ASSISTED VAGINAL HYSTERECTOMY;  Surgeon: Cyril Mourning, MD;  Location: [facility];  Service: Gynecology;  Laterality: N/A;  . Cystocele repair  03/04/2011    Procedure: ANTERIOR REPAIR (CYSTOCELE);  Surgeon: Reece Packer;  Location: [facility];  Service: Urology;  Laterality: N/A;  . Bladder suspension  03/04/2011    Procedure: [place] PROCEDURE;  Surgeon: Elayne Snare MacDiarmid;  Location: [place] [facility] [facility];  Service: Urology;  Laterality: N/A;  . Cystoscopy  03/04/2011    Procedure: CYSTOSCOPY;  Surgeon: Elayne Snare MacDiarmid;  Location: [facility];  Service: Urology;   Laterality: N/A;  . Abdominal hysterectomy    . Knee surgery      Social History   Social History  . Marital Status: Legally Separated    Spouse Name: N/A  . Number of Children: N/A  . Years of Education: N/A   Occupational History  . Not on file.   Social History Main Topics  . Smoking status: Former Smoker    Quit date: 07/24/2013  . Smokeless tobacco: Not on file  . Alcohol Use: No  . Drug Use: No  . Sexual Activity: Not on file   Other Topics Concern  . Not on file   Social History Narrative    Current Outpatient Prescriptions on File Prior to Visit  Medication Sig Dispense Refill  . clonazePAM (KLONOPIN) 0.5 MG tablet Take 0.5 mg by mouth 2 (two) times daily as needed. For anxiety    . diclofenac (VOLTAREN) 50 MG EC tablet Take 1 tablet (50 mg total) by mouth 3 (three) times daily as needed for moderate pain. 30 tablet 0  . DULoxetine (CYMBALTA) 60 MG capsule Take 60 mg by mouth at bedtime.    . gabapentin (NEURONTIN) 400 MG capsule Take 400 mg by mouth 3 (three) times daily.    . hydrOXYzine (ATARAX/VISTARIL) 25 MG tablet Take 25 mg by mouth 3 (three) times daily as needed for anxiety.     Marland Kitchen  ibuprofen (ADVIL,MOTRIN) 800 MG tablet Take 1 tablet (800 mg total) by mouth 3 (three) times daily. 21 tablet 0  . LATUDA 60 MG TABS Take 60 mg by mouth daily.    Marland Kitchen levothyroxine (SYNTHROID, LEVOTHROID) 50 MCG tablet Take 50 mcg by mouth daily.  5  . lidocaine (LIDODERM) 5 % Place 1 patch onto the skin daily as needed (for pain.).   2  . lidocaine (LIDODERM) 5 % Place 1 patch onto the skin daily. Remove & Discard patch within 12 hours or as directed by MD 30 patch 0  . LYRICA 75 MG capsule Take 75 mg by mouth 2 (two) times daily.  2  . metFORMIN (GLUCOPHAGE) 500 MG tablet Take 500 mg by mouth 2 (two) times daily with a meal.    . OPANA ER, CRUSH RESISTANT, 40 MG T12A Take 40 mg by mouth every 8 (eight) hours.    Marland Kitchen oxyCODONE-acetaminophen (PERCOCET) 10-325 MG per tablet Take 1  tablet by mouth every 4 (four) hours as needed for pain. 15 tablet 0  . prednisoLONE acetate (PRED FORTE) 1 % ophthalmic suspension Place 1 drop into both eyes 2 (two) times daily. Not started yet    . predniSONE (DELTASONE) 20 MG tablet Take 2 tablets (40 mg total) by mouth daily. 10 tablet 0  . PROAIR HFA 108 (90 Base) MCG/ACT inhaler Inhale 2 puffs into the lungs every 6 (six) hours as needed.  5  . RESTASIS 0.05 % ophthalmic emulsion PLACE 1 DROP IN EACH EYE TWICE A DAY  3  . simvastatin (ZOCOR) 20 MG tablet Take 20 mg by mouth daily.  5  . SUMAtriptan 6 MG/0.5ML SOAJ Inject 6 mg into the muscle daily as needed. For headaches.  5  . topiramate (TOPAMAX) 50 MG tablet Take 150-200 mg by mouth at bedtime. Reported on 07/04/2015  12  . traZODone (DESYREL) 100 MG tablet Take 200 mg by mouth at bedtime.     No current facility-administered medications on file prior to visit.    Allergies  Allergen Reactions  . Aspirin Other (See Comments)    Ulcer; Does fine with ibuprofen & Tylenol    Family History  Problem Relation Age of Onset  . Heart attack Father     61s  . Heart attack Sister     82  . Diabetes Mother   . Heart disease Mother   . Hypertension Mother   . COPD Other     BP 126/78 mmHg  Pulse 102  Temp(Src) 98.2 F (36.8 C) (Oral)  Ht 5\' 2"  (1.575 m)  Wt 218 lb 3.2 oz (98.975 kg)  BMI 39.90 kg/m2  SpO2 99%  LMP 01/08/2011  Review of Systems Denies LOC.      Objective:   Physical Exam VITAL SIGNS:  See vs page GENERAL: no distress Pulses: dorsalis pedis intact bilat.   MSK: no deformity of the feet CV: trace bilat leg edema.   Skin:  no ulcer on the feet.  normal color and temp on the feet. Neuro: sensation is intact to touch on the feet.     Lab Results  Component Value Date   HGBA1C 7.9 09/06/2015      Assessment & Plan:  DM: this is the best control this pt should aim for, given this regimen, which does match insulin to her changing needs throughout  the day. Obesity: worse: I advised pt to continue to pursue surgery.  i did letter and form. Elevated BP: this will  improve with surgery  Patient is advised the following: Patient Instructions  check your blood sugar twice a day.  vary the time of day when you check, between before the 3 meals, and at bedtime.  also check if you have symptoms of your blood sugar being too high or too low.  please keep a record of the readings and bring it to your next appointment here.  You can write it on any piece of paper.  please call us sooner if your blood sugar goes below 70, or if you have a lot of readings over 200. Please change the basaglar to lantus, 160 units each morning.   On this type of insulin schedule, you should eat meals on a regular schedule.  If a meal is missed or significantly delayed, your blood sugar could go low.  Please come back for a follow-up appointment in 2 months, or sooner if you have the surgery.

## 2015-09-06 ENCOUNTER — Other Ambulatory Visit (INDEPENDENT_AMBULATORY_CARE_PROVIDER_SITE_OTHER): Payer: Medicare Other | Admitting: *Deleted

## 2015-09-06 ENCOUNTER — Encounter: Payer: Self-pay | Admitting: Endocrinology

## 2015-09-06 ENCOUNTER — Ambulatory Visit (INDEPENDENT_AMBULATORY_CARE_PROVIDER_SITE_OTHER): Payer: Medicare Other | Admitting: Endocrinology

## 2015-09-06 ENCOUNTER — Encounter: Payer: Self-pay | Admitting: Dietician

## 2015-09-06 ENCOUNTER — Ambulatory Visit: Payer: Self-pay | Admitting: Dietician

## 2015-09-06 VITALS — BP 126/78 | HR 102 | Temp 98.2°F | Ht 62.0 in | Wt 218.2 lb

## 2015-09-06 DIAGNOSIS — E119 Type 2 diabetes mellitus without complications: Secondary | ICD-10-CM | POA: Insufficient documentation

## 2015-09-06 DIAGNOSIS — E1142 Type 2 diabetes mellitus with diabetic polyneuropathy: Secondary | ICD-10-CM

## 2015-09-06 DIAGNOSIS — Z794 Long term (current) use of insulin: Secondary | ICD-10-CM | POA: Diagnosis not present

## 2015-09-06 LAB — POCT GLYCOSYLATED HEMOGLOBIN (HGB A1C): Hemoglobin A1C: 7.9

## 2015-09-06 MED ORDER — INSULIN GLARGINE 100 UNIT/ML SOLOSTAR PEN: 160.0000 [IU] | PEN_INJECTOR | SUBCUTANEOUS | Status: DC

## 2015-09-10 ENCOUNTER — Encounter: Payer: Self-pay | Admitting: Skilled Nursing Facility1

## 2015-09-10 ENCOUNTER — Encounter: Payer: Medicare Other | Attending: General Surgery | Admitting: Skilled Nursing Facility1

## 2015-09-10 VITALS — Ht 61.0 in | Wt 219.0 lb

## 2015-09-10 DIAGNOSIS — E669 Obesity, unspecified: Secondary | ICD-10-CM

## 2015-09-10 NOTE — Patient Instructions (Addendum)
Follow Pre-Op Goals Try Protein Shakes Call [facility] at [phone] when surgery is scheduled to enroll in Pre-Op Class  Things to remember:  Please always be honest with [place]. We want to support you!  If you have any questions or concerns in between appointments, please call or email Ferol Luz, or Margarita Grizzle.  The diet after surgery will be high protein and low in carbohydrate.  Vitamins and calcium need to be taken for the rest of your life.  Feel free to include support people in any classes or appointments.  Eat your handful of nuts with your fruit for snack  A meal: protein, vegetable, carbohydrate (eaten in that order)  -Try to go down to 1% milk   Supplement recommendations:  Complete" Multivitamin: Sleeve Gastrectomy and RYGB patients take a double dose of MVI. LAGB patients take single dose as it is written on the package. Vitamin must be liquid or chewable but not gummy. Examples of these include Flintstones Complete and Centrum Complete. If the vitamin is bariatric-specific, take 1 dose as it is already formulated for bariatric surgery patients. Examples of these are Bariatric Advantage, Celebrate, and [employer]. These can be found at the [facility] and/or online.     Calcium citrate: 1500 mg/day of Calcium citrate (also chewable or liquid) is recommended for all procedures. The body is only able to absorb 500-600 mg of Calcium at one time so 3 daily doses of 500 mg are recommended. Calcium doses must be taken a minimum of 2 hours apart. Additionally, Calcium must be taken 2 hours apart from iron-containing MVI. Examples of brands include Celebrate, Bariatric Advantage, and Wellesse. These brands must be purchased online or at the [facility]. Citracal Petites is the only Calcium citrate supplement found in general grocery stores and pharmacies. This is in tablet form and may be recommended for patients who do not tolerate chewable Calcium.   Continued or added Vitamin D supplementation based on individual needs.    Vitamin B12: 300-500 mcg/day for Sleeve Gastrectomy and RYGB. Optional for LAGB patients as stomach remains fully intact. Must be taken intramuscularly, sublingually, or inhaled nasally. Oral route is not recommended.

## 2015-09-10 NOTE — Progress Notes (Signed)
  Pre-Op Assessment Visit:  Pre-Operative Roux-en-Y Surgery  Medical Nutrition Therapy:  Appt start time: 1000   End time:  1100.  Patient was seen on 09/10/2015 for Pre-Operative Nutrition Assessment. Assessment and letter of approval faxed to [facility] Surgery Bariatric Surgery Program coordinator on 09/10/2015.   Pt states she started gaining wt substantially when she started insulin. 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   Preferred Learning Style:  No preference indicated   Learning Readiness:   Ready  Handouts given during visit include:  Pre-Op Goals Bariatric Surgery Protein Shakes  During the appointment today the following Pre-Op Goals were reviewed with the patient: Maintain or lose weight as instructed by your surgeon Make healthy food choices Begin to limit portion sizes Limited concentrated sugars and fried foods Keep fat/sugar in the single digits per serving on   food labels Practice CHEWING your food  (aim for 30 chews per bite or until applesauce consistency) Practice not drinking 15 minutes before, during, and 30 minutes after each meal/snack Avoid all carbonated beverages  Avoid/limit caffeinated beverages  Avoid all sugar-sweetened beverages Consume 3 meals per day; eat every 3-5 hours Make a list of non-food related activities Aim for 64-100 ounces of FLUID daily  Aim for at least 60-80 grams of PROTEIN daily Look for a liquid protein source that contain ?15 g protein and ?5 g carbohydrate  (ex: shakes, drinks, shots)  Patient-Centered Goals: 10/10 specific/non-scale and confidence/importance scale 1-10  Demonstrated degree of understanding via:  Teach Back  Teaching Method Utilized:  Visual Auditory Hands on  Barriers to learning/adherence to lifestyle change: None identified  Patient to call the [facility] to enroll in  Pre-Op and Post-Op Nutrition Education when surgery date is scheduled.

## 2015-09-14 ENCOUNTER — Other Ambulatory Visit: Payer: Self-pay

## 2015-09-14 ENCOUNTER — Ambulatory Visit (HOSPITAL_COMMUNITY)
Admission: RE | Admit: 2015-09-14 | Discharge: 2015-09-14 | Disposition: A | Discharge status: Home / Self Care | Payer: Medicare Other | Source: Ambulatory Visit | Attending: General Surgery | Admitting: General Surgery

## 2015-09-14 DIAGNOSIS — Z01818 Encounter for other preprocedural examination: Secondary | ICD-10-CM | POA: Insufficient documentation

## 2015-09-27 ENCOUNTER — Ambulatory Visit: Payer: Self-pay | Admitting: Dietician

## 2015-10-01 ENCOUNTER — Telehealth: Payer: Self-pay | Admitting: Endocrinology

## 2015-10-01 ENCOUNTER — Encounter: Payer: Medicare Other | Attending: General Surgery | Admitting: Dietician

## 2015-10-01 ENCOUNTER — Encounter: Payer: Self-pay | Admitting: Dietician

## 2015-10-01 NOTE — Telephone Encounter (Signed)
Attempted to reach the pt. Pt was unavailable. Left a vm advising pt to call back to go over medication adjustments.

## 2015-10-01 NOTE — Patient Instructions (Addendum)
Start eating 3 meals per day. (Meal = mostly meat/egg and vegetables-greens,cabbage,salads,carrots,green beans)  Start working out cutting back on carbs (bread, pasta, rice, grits, cereal, crackers, chips, potatoes, sweet potatoes, corn, peas) Have crystal light instead of lemonade.  Keep working on chewing foods well. Cut out fried foods and foods with sugar (jelly beans).  Work on not drinking 15 minutes before you eat and waiting until 30 minutes after you eat before drinking.

## 2015-10-01 NOTE — Progress Notes (Signed)
  6 Months Supervised Weight Loss Visit:   Pre-Operative RYGB Surgery  Medical Nutrition Therapy:  Appt start time: 0840 end time:  0900.  Primary concerns today: Supervised Weight Loss Visit. Returns with no weight change. Has had a lot of acid reflux since last appointment. Starting taking a new liquid medication for reflux recommended from the pharmacist. Having a lot of swelling in knees and ankle today.   Has been drinking [employer], [employer] (consulting). Eats 1-2 meals per day (at night) since husband works second shift. Drinking water and lemonade and tea every once in while. Started working on chewing well. Everything she cooks is "fried" but is working on having more baked foods. Eats a lot of jelly beans. Eats a lot of pears and cashews.   Weight: 219.3 BMI: 41.4  Preferred Learning Style:   No preference indicated   Learning Readiness:   Ready  Medications: see list  Recent physical activity:  unknown  Progress Towards Goal(s):  In progress.  Handouts given during visit include:  none   Nutritional Diagnosis:  [employer]-3.3 Obesity related to past poor dietary habits and physical inactivity as evidenced by patient attending supervised weight loss for insurance approval of bariatric surgery.    Intervention:  Nutrition counseling provided. Plan: Start eating 3 meals per day. (Meal = mostly meat/egg and vegetables-greens,cabbage,salads,carrots,green beans)  Start working out cutting back on carbs (bread, pasta, rice, grits, cereal, crackers, chips, potatoes, sweet potatoes, corn, peas) Have crystal light instead of lemonade.  Keep working on chewing foods well. Cut out fried foods and foods with sugar (jelly beans).  Work on not drinking 15 minutes before you eat and waiting until 30 minutes after you eat before drinking.  Teaching Method Utilized:  Visual Auditory Hands on  Barriers to learning/adherence to lifestyle change: unstructured meal schedule  Demonstrated degree of  understanding via:  Teach Back   Monitoring/Evaluation:  Dietary intake, exercise, and body weight. Follow up in 1 months for 6 month supervised weight loss visit.

## 2015-10-01 NOTE — Telephone Encounter (Signed)
Is she taking prednisone?  What insulin what she taking before?  Is her blood sugar high any certain time of the day?

## 2015-10-01 NOTE — Telephone Encounter (Signed)
Patient stated that Dr Loanne Drilling changed her insulin to Lantus, and her legs are swelling and b/s is running high 320, and want to know if it is the medication.  Please advise

## 2015-10-01 NOTE — Telephone Encounter (Signed)
I attempted to contact the pt. Pt was unavailable at the present time because she had another MD's office on the phone. Pt stated she would call back at a later time.

## 2015-10-01 NOTE — Telephone Encounter (Signed)
Rhonda, Could you fwd to Dr. Dwyane Dee and ask him to advise during Dr. Cordelia Pen absence, Thanks!

## 2015-10-01 NOTE — Telephone Encounter (Signed)
[name], Could you contact the pt and advise of Dr. Ronnie Derby note?

## 2015-10-01 NOTE — Telephone Encounter (Signed)
If she is taking 160 units of Lantus in the morning she will need to add another 30 units at suppertime but reduce her morning dose to 140 She needs to talk to her PCP regarding the swelling

## 2015-10-01 NOTE — Telephone Encounter (Signed)
Pt returned call. Pt stated that she is not on prednisone. Pt stated her blood sugar is running high in the AM. She was on Basaglar (ins would not cover it) so he switched her to Lantus (the preferred). The swelling in her legs began mid week, last week. Pt states the highs are usually first thing in the morning when she checks them (different times of the morning - depending on her schedule).

## 2015-10-01 NOTE — Telephone Encounter (Signed)
Please see below and advise Megan.

## 2015-10-02 NOTE — Telephone Encounter (Signed)
Pt advised of instruction's by Dr. Dwyane Dee and voiced understanding.

## 2015-10-12 ENCOUNTER — Ambulatory Visit: Payer: Self-pay | Admitting: Skilled Nursing Facility1

## 2015-10-13 ENCOUNTER — Encounter (HOSPITAL_COMMUNITY): Payer: Self-pay

## 2015-10-13 ENCOUNTER — Ambulatory Visit (HOSPITAL_COMMUNITY)
Admission: EM | Admit: 2015-10-13 | Discharge: 2015-10-13 | Disposition: A | Discharge status: Home / Self Care | Payer: Worker's Compensation | Attending: Emergency Medicine | Admitting: Emergency Medicine

## 2015-10-13 DIAGNOSIS — M25561 Pain in right knee: Secondary | ICD-10-CM

## 2015-10-13 DIAGNOSIS — G44209 Tension-type headache, unspecified, not intractable: Secondary | ICD-10-CM | POA: Diagnosis not present

## 2015-10-13 MED ORDER — PREDNISONE 50 MG PO TABS: ORAL_TABLET | ORAL | Status: DC

## 2015-10-13 MED ORDER — KETOROLAC TROMETHAMINE 60 MG/2ML IM SOLN
60.0000 mg | Freq: Once | INTRAMUSCULAR | Status: AC
  Administered 2015-10-13: 60 mg via INTRAMUSCULAR

## 2015-10-13 MED ORDER — KETOROLAC TROMETHAMINE 60 MG/2ML IM SOLN
INTRAMUSCULAR | Status: AC
  Filled 2015-10-13: qty 2

## 2015-10-13 NOTE — ED Notes (Signed)
Patient presents with knee and head pain x2 days she has taken percocet for pain last time taken was 9 am this morning. No acute distress

## 2015-10-13 NOTE — ED Provider Notes (Signed)
CSN: [id number]     Arrival date & time 10/13/15  1546 History   First MD Initiated Contact with Patient 10/13/15 1641     Chief Complaint  Patient presents with  . Knee Pain  . Headache   (Consider location/radiation/quality/duration/timing/severity/associated sxs/prior Treatment) HPI She is a 50 year old woman here for evaluation of right knee pain. She has had a lot of problems with this kneesince a work-related injury. She has had 2 arthroscopic surgeries on the knee. She was seen here 2 months ago and treated with a cortisone injection which she states did help a little bit, but not for very long.She reports a burning type pain across the anteromedial knee. It is worse when she tries to stand up or twist on the knee. She reports popping of the knee, but denies any locking. She tried taking Opana and oxycodone this morning which did not relieve the pain.  She also states she has a headache. It is described as a bandlike pressure around her head. She states it has been present on and off for quite some time. No photophobia or nausea with it no neurologic deficits.  Past Medical History  Diagnosis Date  . Abdominal pain   . Diabetes mellitus     states her doctor took her off all DM meds in past month  . COPD (chronic obstructive pulmonary disease) ([place])   . GERD (gastroesophageal reflux disease)   . Chronic nausea   . Hypothyroidism   . Mental disorder     Bipolar and schizophrenic  . Depression   . Headache(784.0)   . Anxiety   . Atrial fibrillation ([place]) 09/29/11    converted spontaneously  . Chronic pain   . Tobacco abuse   . HTN (hypertension)   . Accidental drug overdose April 2013  . Schizophrenia ([place])   . Hyperlipidemia   . Chronic back pain   . Chronic knee pain   . Diabetic neuropathy [facility])    Past Surgical History  Procedure Laterality Date  . Laparoscopic assisted vaginal hysterectomy  03/04/2011    Procedure: LAPAROSCOPIC ASSISTED VAGINAL HYSTERECTOMY;   Surgeon: Cyril Mourning, MD;  Location: [place] [facility];  Service: Gynecology;  Laterality: N/A;  . Cystocele repair  03/04/2011    Procedure: ANTERIOR REPAIR (CYSTOCELE);  Surgeon: Reece Packer;  Location: [place] [facility];  Service: Urology;  Laterality: N/A;  . Bladder suspension  03/04/2011    Procedure: [place] PROCEDURE;  Surgeon: Elayne Snare MacDiarmid;  Location: [facility];  Service: Urology;  Laterality: N/A;  . Cystoscopy  03/04/2011    Procedure: CYSTOSCOPY;  Surgeon: Elayne Snare MacDiarmid;  Location: [facility];  Service: Urology;  Laterality: N/A;  . Abdominal hysterectomy    . Knee surgery     Family History  Problem Relation Age of Onset  . Heart attack Father     41s  . Heart attack Sister     46  . Diabetes Mother   . Heart disease Mother   . Hypertension Mother   . COPD Other    Social History  Substance Use Topics  . Smoking status: Former Smoker    Quit date: 07/24/2013  . Smokeless tobacco: Never Used  . Alcohol Use: No   OB History    Gravida Para Term Preterm AB TAB SAB Ectopic Multiple Living   3 3   0 0 0 0 0 3     Review of Systems As in history of present illness Allergies  Aspirin  Home Medications  Prior to Admission medications   Medication Sig Start Date End Date Taking? Authorizing Provider  clonazePAM (KLONOPIN) 0.5 MG tablet Take 0.5 mg by mouth 2 (two) times daily as needed. For anxiety 06/30/15  Yes Historical Provider, MD  DULoxetine (CYMBALTA) 60 MG capsule Take 60 mg by mouth at bedtime.   Yes Historical Provider, MD  gabapentin (NEURONTIN) 400 MG capsule Take 400 mg by mouth 3 (three) times daily. 11/06/14  Yes Historical Provider, MD  ibuprofen (ADVIL,MOTRIN) 800 MG tablet Take 1 tablet (800 mg total) by mouth 3 (three) times daily. 02/11/15  Yes Shari Upstill, PA-C  Insulin Glargine (LANTUS) 100 UNIT/ML Solostar Pen Inject 160 Units into the skin every morning. And pen needles 2/day 09/06/15  Yes Renato Shin, MD  LATUDA 60 MG TABS Take 60 mg by mouth daily.  06/30/15  Yes Historical Provider, MD  levothyroxine (SYNTHROID, LEVOTHROID) 50 MCG tablet Take 50 mcg by mouth daily. 10/10/14  Yes Historical Provider, MD  LYRICA 75 MG capsule Take 75 mg by mouth 2 (two) times daily. 11/17/14  Yes Historical Provider, MD  metFORMIN (GLUCOPHAGE) 500 MG tablet Take 500 mg by mouth 2 (two) times daily with a meal.   Yes Historical Provider, MD  OPANA ER, CRUSH RESISTANT, 40 MG T12A Take 40 mg by mouth every 8 (eight) hours. 07/06/15  Yes Historical Provider, MD  oxyCODONE-acetaminophen (PERCOCET) 10-325 MG per tablet Take 1 tablet by mouth every 4 (four) hours as needed for pain. 12/02/14  Yes Pamella Pert, MD  PROAIR HFA [phone] Base) MCG/ACT inhaler Inhale 2 puffs into the lungs every 6 (six) hours as needed. 06/15/15  Yes Historical Provider, MD  simvastatin (ZOCOR) 20 MG tablet Take 20 mg by mouth daily. 02/08/15  Yes Historical Provider, MD  SUMAtriptan 6 MG/0.5ML SOAJ Inject 6 mg into the muscle daily as needed. For headaches. 06/09/15  Yes Historical Provider, MD  topiramate (TOPAMAX) 50 MG tablet Take 150-200 mg by mouth at bedtime. Reported on 07/04/2015 10/10/14  Yes Historical Provider, MD  traZODone (DESYREL) 100 MG tablet Take 200 mg by mouth at bedtime.   Yes Robbie Lis, MD  diclofenac (VOLTAREN) 50 MG EC tablet Take 1 tablet (50 mg total) by mouth 3 (three) times daily as needed for moderate pain. 07/28/15   Melony Overly, MD  hydrOXYzine (ATARAX/VISTARIL) 25 MG tablet Take 25 mg by mouth 3 (three) times daily as needed for anxiety.  11/07/14   Historical Provider, MD  lidocaine (LIDODERM) 5 % Place 1 patch onto the skin daily as needed (for pain.).  10/14/14   Historical Provider, MD  lidocaine (LIDODERM) 5 % Place 1 patch onto the skin daily. Remove & Discard patch within 12 hours or as directed by MD 07/09/15   Helane Gunther Joy, PA-C  prednisoLONE acetate (PRED FORTE) 1 % ophthalmic suspension Place 1 drop into both eyes 2 (two) times daily. Not started yet  07/05/15   Historical Provider, MD  predniSONE (DELTASONE) 50 MG tablet Take 1 pill daily for 5 days. 10/13/15   Melony Overly, MD  RESTASIS 0.05 % ophthalmic emulsion PLACE 1 DROP IN [facility] EYE TWICE A DAY 06/27/15   Historical Provider, MD   Meds Ordered and Administered this Visit   Medications  ketorolac (TORADOL) injection 60 mg (60 mg Intramuscular Given 10/13/15 1739)    BP 126/66 mmHg  Pulse 99  Temp(Src) 98 F (36.7 C) (Oral)  SpO2 94%  LMP 01/08/2011 No data found.   Physical  Exam  Constitutional: She is oriented to person, place, and time. She appears well-developed and well-nourished. No distress.  Neck: Neck supple.  Cardiovascular: Normal rate.   Pulmonary/Chest: Effort normal.  Musculoskeletal: She exhibits edema (1+pedal edema bilaterally).  Right knee: No erythema or edema. She is diffusely tender to palpation throughout the knee.No joint laxity. Positive McMurray's.  Neurological: She is alert and oriented to person, place, and time.    ED Course  Procedures (including critical care time)  Labs Review Labs Reviewed - No data to display  Imaging Review No results found.   MDM   1. Right knee pain   2. Tension headache    Toradol given to help with tension type headache and knee pain. Prescription for prednisone for the knee. Knee sleeve given. Follow-up with orthopedics.    Melony Overly, MD 10/13/15 [phone]

## 2015-10-13 NOTE — Discharge Instructions (Signed)
I suspect your knee pain is coming from the arthritis in your knee and a meniscal tear. Wear the knee sleeve when you are moving around to provide some added support. We gave you a shot today to help with pain. Take the prednisone daily for 5 days. Please follow-up with your orthopedic doctor for your knee as you may need repeat surgery.

## 2015-10-14 ENCOUNTER — Encounter (HOSPITAL_COMMUNITY): Payer: Self-pay | Admitting: Family Medicine

## 2015-10-14 ENCOUNTER — Emergency Department (HOSPITAL_COMMUNITY): Payer: Medicare Other

## 2015-10-14 ENCOUNTER — Emergency Department (HOSPITAL_COMMUNITY)
Admission: EM | Admit: 2015-10-14 | Discharge: 2015-10-14 | Disposition: A | Discharge status: Home / Self Care | Payer: Medicare Other | Attending: Emergency Medicine | Admitting: Emergency Medicine

## 2015-10-14 DIAGNOSIS — Z791 Long term (current) use of non-steroidal anti-inflammatories (NSAID): Secondary | ICD-10-CM | POA: Diagnosis not present

## 2015-10-14 DIAGNOSIS — M25561 Pain in right knee: Secondary | ICD-10-CM | POA: Diagnosis not present

## 2015-10-14 DIAGNOSIS — F419 Anxiety disorder, unspecified: Secondary | ICD-10-CM | POA: Diagnosis not present

## 2015-10-14 DIAGNOSIS — F209 Schizophrenia, unspecified: Secondary | ICD-10-CM | POA: Insufficient documentation

## 2015-10-14 DIAGNOSIS — Z794 Long term (current) use of insulin: Secondary | ICD-10-CM | POA: Diagnosis not present

## 2015-10-14 DIAGNOSIS — J449 Chronic obstructive pulmonary disease, unspecified: Secondary | ICD-10-CM | POA: Insufficient documentation

## 2015-10-14 DIAGNOSIS — G8929 Other chronic pain: Secondary | ICD-10-CM | POA: Diagnosis not present

## 2015-10-14 DIAGNOSIS — R609 Edema, unspecified: Secondary | ICD-10-CM

## 2015-10-14 DIAGNOSIS — R600 Localized edema: Secondary | ICD-10-CM | POA: Insufficient documentation

## 2015-10-14 DIAGNOSIS — F329 Major depressive disorder, single episode, unspecified: Secondary | ICD-10-CM | POA: Diagnosis not present

## 2015-10-14 DIAGNOSIS — E785 Hyperlipidemia, unspecified: Secondary | ICD-10-CM | POA: Insufficient documentation

## 2015-10-14 DIAGNOSIS — Z7952 Long term (current) use of systemic steroids: Secondary | ICD-10-CM | POA: Diagnosis not present

## 2015-10-14 DIAGNOSIS — M7989 Other specified soft tissue disorders: Secondary | ICD-10-CM | POA: Diagnosis present

## 2015-10-14 DIAGNOSIS — Z8719 Personal history of other diseases of the digestive system: Secondary | ICD-10-CM | POA: Insufficient documentation

## 2015-10-14 DIAGNOSIS — Z87891 Personal history of nicotine dependence: Secondary | ICD-10-CM | POA: Diagnosis not present

## 2015-10-14 DIAGNOSIS — Z79899 Other long term (current) drug therapy: Secondary | ICD-10-CM | POA: Insufficient documentation

## 2015-10-14 DIAGNOSIS — M545 Low back pain: Secondary | ICD-10-CM | POA: Diagnosis not present

## 2015-10-14 DIAGNOSIS — E1140 Type 2 diabetes mellitus with diabetic neuropathy, unspecified: Secondary | ICD-10-CM | POA: Diagnosis not present

## 2015-10-14 DIAGNOSIS — Z7984 Long term (current) use of oral hypoglycemic drugs: Secondary | ICD-10-CM | POA: Insufficient documentation

## 2015-10-14 LAB — BASIC METABOLIC PANEL
ANION GAP: 12 (ref 5–15)
BUN: 23 mg/dL — ABNORMAL HIGH (ref 6–20)
CO2: 21 mmol/L — AB (ref 22–32)
Calcium: 9.2 mg/dL (ref 8.9–10.3)
Chloride: 107 mmol/L (ref 101–111)
Creatinine, Ser: 0.76 mg/dL (ref 0.44–1.00)
GLUCOSE: 65 mg/dL (ref 65–99)
POTASSIUM: 4 mmol/L (ref 3.5–5.1)
Sodium: 140 mmol/L (ref 135–145)

## 2015-10-14 LAB — CBC
HEMATOCRIT: 36.9 % (ref 36.0–46.0)
HEMOGLOBIN: 11.6 g/dL — AB (ref 12.0–15.0)
MCH: 26.5 pg (ref 26.0–34.0)
MCHC: 31.4 g/dL (ref 30.0–36.0)
MCV: 84.2 fL (ref 78.0–100.0)
Platelets: 223 10*3/uL (ref 150–400)
RBC: 4.38 MIL/uL (ref 3.87–5.11)
RDW: 12.7 % (ref 11.5–15.5)
WBC: 8.9 10*3/uL (ref 4.0–10.5)

## 2015-10-14 LAB — TROPONIN I: Troponin I: <0.03 ng/mL (ref <0.031)

## 2015-10-14 LAB — CBG MONITORING, ED
GLUCOSE-CAPILLARY: 62 mg/dL — AB (ref 65–99)
Glucose-Capillary: 91 mg/dL (ref 65–99)

## 2015-10-14 LAB — BRAIN NATRIURETIC PEPTIDE: B NATRIURETIC PEPTIDE 5: 7.5 pg/mL (ref 0.0–100.0)

## 2015-10-14 MED ORDER — FUROSEMIDE 20 MG PO TABS: 20.0000 mg | ORAL_TABLET | Freq: Every day | ORAL | Status: DC

## 2015-10-14 NOTE — ED Notes (Signed)
Pt given orange juice.

## 2015-10-14 NOTE — ED Notes (Signed)
MD at bedside. 

## 2015-10-14 NOTE — ED Notes (Signed)
Pt c/o bilat feet pain and swelling since Wednesday. Pt c/o dyspnea with exertion and difficulty sleeping due to becoming SOB. Pt also c/o low back pain. +1 pitting edema noted to both feet.

## 2015-10-14 NOTE — Discharge Instructions (Signed)
Edema °Edema is an abnormal buildup of fluids in your body tissues. Edema is somewhat dependent on gravity to pull the fluid to the lowest place in your body. That makes the condition more common in the legs and thighs (lower extremities). Painless swelling of the feet and ankles is common and becomes more likely as you get older. It is also common in looser tissues, like around your eyes.  °When the affected area is squeezed, the fluid may move out of that spot and leave a dent for a few moments. This dent is called pitting.  °CAUSES  °There are many possible causes of edema. Eating too much salt and being on your feet or sitting for a long time can cause edema in your legs and ankles. Hot weather may make edema worse. Common medical causes of edema include: °· Heart failure. °· Liver disease. °· Kidney disease. °· Weak blood vessels in your legs. °· Cancer. °· An injury. °· Pregnancy. °· Some medications. °· Obesity.  °SYMPTOMS  °Edema is usually painless. Your skin may look swollen or shiny.  °DIAGNOSIS  °Your health care provider may be able to diagnose edema by asking about your medical history and doing a physical exam. You may need to have tests such as X-rays, an electrocardiogram, or blood tests to check for medical conditions that may cause edema.  °TREATMENT  °Edema treatment depends on the cause. If you have heart, liver, or kidney disease, you need the treatment appropriate for these conditions. General treatment may include: °· Elevation of the affected body part above the level of your heart. °· Compression of the affected body part. Pressure from elastic bandages or support stockings squeezes the tissues and forces fluid back into the blood vessels. This keeps fluid from entering the tissues. °· Restriction of fluid and salt intake. °· Use of a water pill (diuretic). These medications are appropriate only for some types of edema. They pull fluid out of your body and make you urinate more often. This  gets rid of fluid and reduces swelling, but diuretics can have side effects. Only use diuretics as directed by your health care provider. °HOME CARE INSTRUCTIONS  °· Keep the affected body part above the level of your heart when you are lying down.   °· Do not sit still or stand for prolonged periods.   °· Do not put anything directly under your knees when lying down. °· Do not wear constricting clothing or garters on your upper legs.   °· Exercise your legs to work the fluid back into your blood vessels. This may help the swelling go down.   °· Wear elastic bandages or support stockings to reduce ankle swelling as directed by your health care provider.   °· Eat a low-salt diet to reduce fluid if your health care provider recommends it.   °· Only take medicines as directed by your health care provider.  °SEEK MEDICAL CARE IF:  °· Your edema is not responding to treatment. °· You have heart, liver, or kidney disease and notice symptoms of edema. °· You have edema in your legs that does not improve after elevating them.   °· You have sudden and unexplained weight gain. °SEEK IMMEDIATE MEDICAL CARE IF:  °· You develop shortness of breath or chest pain.   °· You cannot breathe when you lie down. °· You develop pain, redness, or warmth in the swollen areas.   °· You have heart, liver, or kidney disease and suddenly get edema. °· You have a fever and your symptoms suddenly get worse. °MAKE SURE YOU:  °·   Understand these instructions.  Will watch your condition.  Will get help right away if you are not doing well or get worse.   This information is not intended to replace advice given to you by your health care provider. Make sure you discuss any questions you have with your health care provider.   Document Released: 06/09/2005 Document Revised: 06/30/2014 Document Reviewed: 04/01/2013 Elsevier Interactive Patient Education 2016 Else[employer] Inc[employer]  Chronic Pain Chronic pain can be defined as pain that is off and on  and lasts for 3-6 months or longer. Many things cause chronic pain, which can make it difficult to make a diagnosis. There are many treatment options available for chronic pain. However, finding a treatment that works well for you may require trying various approaches until the right one is found. Many people benefit from a combination of two or more types of treatment to control their pain. SYMPTOMS  Chronic pain can occur anywhere in the body and can range from mild to very severe. Some types of chronic pain include:  Headache.  Low back pain.  Cancer pain.  Arthritis pain.  Neurogenic pain. This is pain resulting from damage to nerves. People with chronic pain may also have other symptoms such as:  Depression.  Anger.  Insomnia.  Anxiety. DIAGNOSIS  Your health care provider will help diagnose your condition over time. In many cases, the initial focus will be on excluding possible conditions that could be causing the pain. Depending on your symptoms, your health care provider may order tests to diagnose your condition. Some of these tests may include:   Blood tests.   CT scan.   MRI.   X-rays.   Ultrasounds.   Nerve conduction studies.  You may need to see a specialist.  TREATMENT  Finding treatment that works well may take time. You may be referred to a pain specialist. He or she may prescribe medicine or therapies, such as:   Mindful meditation or yoga.  Shots (injections) of numbing or pain-relieving medicines into the spine or area of pain.  Local electrical stimulation.  Acupuncture.   Massage therapy.   Aroma, color, light, or sound therapy.   Biofeedback.   Working with a physical therapist to keep from getting stiff.   Regular, gentle exercise.   Cognitive or behavioral therapy.   Group support.  Sometimes, surgery may be recommended.  HOME CARE INSTRUCTIONS   Take all medicines as directed by your health care provider.    Lessen stress in your life by relaxing and doing things such as listening to calming music.   Exercise or be active as directed by your health care provider.   Eat a healthy diet and include things such as vegetables, fruits, fish, and lean meats in your diet.   Keep all follow-up appointments with your health care provider.   Attend a support group with others suffering from chronic pain. SEEK MEDICAL CARE IF:   Your pain gets worse.   You develop a new pain that was not there before.   You cannot tolerate medicines given to you by your health care provider.   You have new symptoms since your last visit with your health care provider.  SEEK IMMEDIATE MEDICAL CARE IF:   You feel weak.   You have decreased sensation or numbness.   You lose control of bowel or bladder function.   Your pain suddenly gets much worse.   You develop shaking.  You develop chills.  You develop confusion.  You develop chest pain.  You develop shortness of breath.  MAKE SURE YOU:  Understand these instructions.  Will watch your condition.  Will get help right away if you are not doing well or get worse.   This information is not intended to replace advice given to you by your health care provider. Make sure you discuss any questions you have with your health care provider.   Document Released: 03/01/2002 Document Revised: 02/09/2013 Document Reviewed: 12/03/2012 Elsevier Interactive Patient Education [employer].

## 2015-10-14 NOTE — ED Notes (Signed)
Pt here for bilateral feet swelling, back pain, increased saliva, wheezing. sts was seen here yesterday and given meds for knee pain at [facility]

## 2015-10-14 NOTE — ED Provider Notes (Signed)
CSN: [id number]     Arrival date & time 10/14/15  1148 History   First MD Initiated Contact with Patient 10/14/15 1212     Chief Complaint  Patient presents with  . Back Pain  . Leg Swelling     (Consider location/radiation/quality/duration/timing/severity/associated sxs/prior Treatment) HPI Patient states she is here for 2 complaints. One is her back pain. She reports her back pain is usually controlled by her home medications that sometimes they are not enough to control her pain. She has chronic aching in the lower back. No focal weakness numbness or tingling to the extremities. She also reports she gets pain in her right knee which again is chronic. She has not had localized swelling or redness to it. She does however report that both of her lower legs have been swollen for the past several weeks. She states due to the swelling of her lower legs and they're very uncomfortable when she walks. Or said they have been doing adjustments on her insulin because her diabetes is very hard to control but she denies that she has been put on any fluid pills for the legs. She reports she has been feeling somewhat short of breath. She also notices at night that she has difficulty sleeping and that she coughs much more during the night. No chest pain. Past Medical History  Diagnosis Date  . Abdominal pain   . Diabetes mellitus     states her doctor took her off all DM meds in past month  . COPD (chronic obstructive pulmonary disease) ([place])   . GERD (gastroesophageal reflux disease)   . Chronic nausea   . Hypothyroidism   . Mental disorder     Bipolar and schizophrenic  . Depression   . Headache(784.0)   . Anxiety   . Atrial fibrillation ([place]) 09/29/11    converted spontaneously  . Chronic pain   . Tobacco abuse   . HTN (hypertension)   . Accidental drug overdose April 2013  . Schizophrenia ([place])   . Hyperlipidemia   . Chronic back pain   . Chronic knee pain   . Diabetic neuropathy [facility])     Past Surgical History  Procedure Laterality Date  . Laparoscopic assisted vaginal hysterectomy  03/04/2011    Procedure: LAPAROSCOPIC ASSISTED VAGINAL HYSTERECTOMY;  Surgeon: Cyril Mourning, MD;  Location: [place] [facility];  Service: Gynecology;  Laterality: N/A;  . Cystocele repair  03/04/2011    Procedure: ANTERIOR REPAIR (CYSTOCELE);  Surgeon: Reece Packer;  Location: [place] [facility];  Service: Urology;  Laterality: N/A;  . Bladder suspension  03/04/2011    Procedure: [place] PROCEDURE;  Surgeon: Elayne Snare MacDiarmid;  Location: [place] [facility];  Service: Urology;  Laterality: N/A;  . Cystoscopy  03/04/2011    Procedure: CYSTOSCOPY;  Surgeon: Elayne Snare MacDiarmid;  Location: [place] [facility];  Service: Urology;  Laterality: N/A;  . Abdominal hysterectomy    . Knee surgery     Family History  Problem Relation Age of Onset  . Heart attack Father     21s  . Heart attack Sister     16  . Diabetes Mother   . Heart disease Mother   . Hypertension Mother   . COPD Other    Social History  Substance Use Topics  . Smoking status: Former Smoker    Quit date: 07/24/2013  . Smokeless tobacco: Never Used  . Alcohol Use: No   OB History    Gravida Para Term Preterm AB TAB SAB Ectopic Multiple Living  3 3   0 0 0 0 0 3     Review of Systems  10 Systems reviewed and are negative for acute change except as noted in the HPI.   Allergies  Aspirin  Home Medications   Prior to Admission medications   Medication Sig Start Date End Date Taking? Authorizing Provider  clonazePAM (KLONOPIN) 0.5 MG tablet Take 0.5 mg by mouth 2 (two) times daily as needed. For anxiety 06/30/15   Historical Provider, MD  diclofenac (VOLTAREN) 50 MG EC tablet Take 1 tablet (50 mg total) by mouth 3 (three) times daily as needed for moderate pain. 07/28/15   Melony Overly, MD  DULoxetine (CYMBALTA) 60 MG capsule Take 60 mg by mouth at bedtime.    Historical Provider, MD  furosemide (LASIX) 20 MG tablet Take 1 tablet (20 mg total) by mouth daily.  10/14/15   Charlesetta Shanks, MD  gabapentin (NEURONTIN) 400 MG capsule Take 400 mg by mouth 3 (three) times daily. 11/06/14   Historical Provider, MD  hydrOXYzine (ATARAX/VISTARIL) 25 MG tablet Take 25 mg by mouth 3 (three) times daily as needed for anxiety.  11/07/14   Historical Provider, MD  ibuprofen (ADVIL,MOTRIN) 800 MG tablet Take 1 tablet (800 mg total) by mouth 3 (three) times daily. 02/11/15   Charlann Lange, PA-C  Insulin Glargine (LANTUS) 100 UNIT/ML Solostar Pen Inject 160 Units into the skin every morning. And pen needles 2/day 09/06/15   Renato Shin, MD  LATUDA 60 MG TABS Take 60 mg by mouth daily. 06/30/15   Historical Provider, MD  levothyroxine (SYNTHROID, LEVOTHROID) 50 MCG tablet Take 50 mcg by mouth daily. 10/10/14   Historical Provider, MD  lidocaine (LIDODERM) 5 % Place 1 patch onto the skin daily as needed (for pain.).  10/14/14   Historical Provider, MD  lidocaine (LIDODERM) 5 % Place 1 patch onto the skin daily. Remove & Discard patch within 12 hours or as directed by MD 07/09/15   Shawn C Joy, PA-C  LYRICA 75 MG capsule Take 75 mg by mouth 2 (two) times daily. 11/17/14   Historical Provider, MD  metFORMIN (GLUCOPHAGE) 500 MG tablet Take 500 mg by mouth 2 (two) times daily with a meal.    Historical Provider, MD  OPANA ER, CRUSH RESISTANT, 40 MG T12A Take 40 mg by mouth every 8 (eight) hours. 07/06/15   Historical Provider, MD  oxyCODONE-acetaminophen (PERCOCET) 10-325 MG per tablet Take 1 tablet by mouth every 4 (four) hours as needed for pain. 12/02/14   Pamella Pert, MD  prednisoLONE acetate (PRED FORTE) 1 % ophthalmic suspension Place 1 drop into both eyes 2 (two) times daily. Not started yet 07/05/15   Historical Provider, MD  predniSONE (DELTASONE) 50 MG tablet Take 1 pill daily for 5 days. 10/13/15   Melony Overly, MD  PROAIR HFA 108 (90 Base) MCG/ACT inhaler Inhale 2 puffs into the lungs every 6 (six) hours as needed. 06/15/15   Historical Provider, MD  RESTASIS 0.05 % ophthalmic  emulsion PLACE 1 DROP IN [facility] EYE TWICE A DAY 06/27/15   Historical Provider, MD  simvastatin (ZOCOR) 20 MG tablet Take 20 mg by mouth daily. 02/08/15   Historical Provider, MD  SUMAtriptan 6 MG/0.5ML SOAJ Inject 6 mg into the muscle daily as needed. For headaches. 06/09/15   Historical Provider, MD  topiramate (TOPAMAX) 50 MG tablet Take 150-200 mg by mouth at bedtime. Reported on 07/04/2015 10/10/14   Historical Provider, MD  traZODone (DESYREL) 100 MG tablet Take  200 mg by mouth at bedtime.    Robbie Lis, MD   BP 107/71 mmHg  Pulse 86  Temp(Src) 98.2 F (36.8 C) (Oral)  Resp 13  SpO2 93%  LMP 01/08/2011 Physical Exam  Constitutional: She is oriented to person, place, and time.  Patient is morbidly obese. She is alert and nontoxic. No respiratory distress at rest.  HENT:  Head: Normocephalic and atraumatic.  Nose: Nose normal.  Mouth/Throat: Oropharynx is clear and moist.  Eyes: Conjunctivae and EOM are normal. Pupils are equal, round, and reactive to light.  Neck: Neck supple.  Cardiovascular: Normal rate, regular rhythm, normal heart sounds and intact distal pulses.   Pulmonary/Chest: Effort normal and breath sounds normal.  Abdominal: Soft. Bowel sounds are normal. She exhibits no distension. There is no tenderness.  Musculoskeletal: She exhibits edema.  Bilateral lower extremity edema approximately 2+ and pitting. No wounds and no cellulitis. Knees are symmetric. No gross effusion on either knee.  Neurological: She is alert and oriented to person, place, and time. She exhibits normal muscle tone. Coordination normal.  Skin: Skin is warm and dry.  Psychiatric: She has a normal mood and affect.    ED Course  Procedures (including critical care time) Labs Review Labs Reviewed  BASIC METABOLIC PANEL - Abnormal; Notable for the following:    CO2 21 (*)    BUN 23 (*)    All other components within normal limits  CBC - Abnormal; Notable for the following:    Hemoglobin 11.6 (*)     All other components within normal limits  CBG MONITORING, ED - Abnormal; Notable for the following:    Glucose-Capillary 62 (*)    All other components within normal limits  BRAIN NATRIURETIC PEPTIDE  TROPONIN I  CBG MONITORING, ED    Imaging Review Dg Chest 2 View  10/14/2015  CLINICAL DATA:  Pt states she is having bilateral swelling in feet and ankles since Monday (04/17) Pt states she contacted PCP Monday and he increased her insulin dose, but she continues to have swelling. Pt states she is SOB and uses O2 at home. EXAM: CHEST  2 VIEW COMPARISON:  09/14/2015 FINDINGS: The heart size and mediastinal contours are within normal limits. Both lungs are clear. The visualized skeletal structures are unremarkable. IMPRESSION: No active cardiopulmonary disease. Electronically Signed   By: Nolon Nations M.D.   On: 10/14/2015 13:37   I have personally reviewed and evaluated these images and lab results as part of my medical decision-making.   EKG Interpretation   Date/Time:  Sunday October 14 2015 12:37:10 EDT Ventricular Rate:  94 PR Interval:  167 QRS Duration: 101 QT Interval:  352 QTC Calculation: 440 R Axis:   10 Text Interpretation:  Sinus rhythm Probable left atrial enlargement Low  voltage, precordial leads Borderline T abnormalities, anterior leads no  change from old Confirmed by Johnney Killian, MD, Jeannie Done [id number]) on 10/14/2015  1:52:27 PM      MDM   Final diagnoses:  Peripheral edema  Chronic pain   Patient presents with symmetric and bilateral swelling of the lower legs. She reports this is been present for at least several weeks. There is not calf tenderness. Her main pain complaint is related to the swelling in her feet which is uncomfortable when she stands on them. At this time, I suspect venous stasis as the likely etiology. Patient does not have signs of congestive heart failure. Lungs are clear. Chest x-ray is normal. There is no elevation in  the BNP. Patient does have  morbid obesity. For the edema, the patient is prescribed Lasix 20 mg a day to try for the next several days. She is also counseled on elevating the legs. She is advised to follow-up with her family doctor next week to determine her response to treatment, monitor her labs and determine if she should have compression hose and additional treatment for venous stasis. Regarding her back pain, this is chronic in nature. She does not describe any new or changed symptoms. She advises that sometimes her medication just isn't quite enough to take care of her back pain. Objectively the patient does not appear to be in severe pain and there is no neurologic deficit associated. She is counseled to continue to work with her physician and pain management physician regarding back pain.    Charlesetta Shanks, MD 10/14/15 1452

## 2015-10-14 NOTE — ED Notes (Signed)
Pt received discharge paperwork. Signature pad not working. Pt verbalized understanding of rx and followup appt.

## 2015-10-26 ENCOUNTER — Other Ambulatory Visit: Payer: Self-pay

## 2015-10-26 DIAGNOSIS — Z1231 Encounter for screening mammogram for malignant neoplasm of breast: Secondary | ICD-10-CM

## 2015-10-29 ENCOUNTER — Encounter: Payer: Medicare Other | Attending: General Surgery | Admitting: Dietician

## 2015-10-29 ENCOUNTER — Encounter: Payer: Self-pay | Admitting: Dietician

## 2015-10-29 DIAGNOSIS — Z6841 Body Mass Index (BMI) 40.0 and over, adult: Secondary | ICD-10-CM | POA: Insufficient documentation

## 2015-10-29 DIAGNOSIS — I10 Essential (primary) hypertension: Secondary | ICD-10-CM | POA: Insufficient documentation

## 2015-10-29 DIAGNOSIS — E1142 Type 2 diabetes mellitus with diabetic polyneuropathy: Secondary | ICD-10-CM | POA: Insufficient documentation

## 2015-10-29 NOTE — Patient Instructions (Signed)
Start eating 3 meals per day. (Meal = mostly meat/egg and vegetables-greens,cabbage,salads,carrots,green beans) If you have a low blood sugar, have 1/2 cup juice/sweet drink (small juice box) or  glucose tablets (keep in your purse). Test blood sugar after 15 minutes. If it is above 70 mg/gl have peanut butter and crackers. Start working out cutting back on carbs (bread, pasta, rice, grits, cereal, crackers, chips, potatoes, sweet potatoes, corn, peas) Keep working on chewing foods well. Continue to work on cutting back on fried foods and foods with sugar (jelly beans).  Stop buying cashews if they are a temptation. Start working on not drinking 15 minutes before you eat and waiting until 30 minutes after you eat before drinking. For yogurt, choose ones that have less than 15 g of carbohydrates or less ([place]annon light and fit or Triple [place] Zero).

## 2015-10-29 NOTE — Progress Notes (Signed)
  6 Months Supervised Weight Loss Visit:   Pre-Operative RYGB Surgery  Medical Nutrition Therapy:  Appt start time: 0950 end time:  1010  Primary concerns today: Supervised Weight Loss Visit #2. Returns with a 2 lb weight gain. Has been working on chewing, cutting back fried food, and eating more vegetables. Has been having a lot of pain in her knees and her back. Still having acid reflux. Still having Premier Protein and tried another protein shake and not sure of the time. Still eating 1-2 meals at night. Cut back on tea and drinking mostly water with crystal light. Had lemonade about 2 times.   Blood sugar dropped dropped and ate a lot of jelly beans, which was the first time she had them all month. Has low blood sugar about 3-4 x per month. Does not check it when it is low, though sometimes it has been around 59-60 mg/dl. Thinks it goes low when she doesn't eat regularly. Trying to work on it.   Eats a lot of pears and cashews.   Weight: 221.7 BMI: 41.9  Preferred Learning Style:   No preference indicated   Learning Readiness:   Ready  Medications: see list  Recent physical activity:  unknown  Progress Towards Goal(s):  In progress.  Handouts given during visit include:  none   Nutritional Diagnosis:  [place]-3.3 Obesity related to past poor dietary habits and physical inactivity as evidenced by patient attending supervised weight loss for insurance approval of bariatric surgery.    Intervention:  Nutrition counseling provided. Plan: Start eating 3 meals per day. (Meal = mostly meat/egg and vegetables-greens,cabbage,salads,carrots,green beans) If you have a low blood sugar, have 1/2 cup juice/sweet drink (small juice box) or  glucose tablets (keep in your purse). Test blood sugar after 15 minutes. If it is above 70 mg/gl have peanut butter and crackers. Start working out cutting back on carbs (bread, pasta, rice, grits, cereal, crackers, chips, potatoes, sweet potatoes, corn,  peas) Keep working on chewing foods well. Continue to work on cutting back on fried foods and foods with sugar (jelly beans).  Stop buying cashews if they are a temptation. Start working on not drinking 15 minutes before you eat and waiting until 30 minutes after you eat before drinking. For yogurt, choose ones that have less than 15 g of carbohydrates or less (Dannon light and fit or Triple [place] Zero).   Teaching Method Utilized:  Visual Auditory Hands on  Barriers to learning/adherence to lifestyle change: unstructured meal schedule  Demonstrated degree of understanding via:  Teach Back   Monitoring/Evaluation:  Dietary intake, exercise, and body weight. Follow up in 1 months for 6 month supervised weight loss visit.

## 2015-11-02 ENCOUNTER — Encounter (HOSPITAL_COMMUNITY): Payer: Self-pay

## 2015-11-02 ENCOUNTER — Emergency Department (HOSPITAL_COMMUNITY)
Admission: EM | Admit: 2015-11-02 | Discharge: 2015-11-02 | Disposition: A | Discharge status: Home / Self Care | Payer: Medicare Other | Attending: Emergency Medicine | Admitting: Emergency Medicine

## 2015-11-02 DIAGNOSIS — F329 Major depressive disorder, single episode, unspecified: Secondary | ICD-10-CM | POA: Insufficient documentation

## 2015-11-02 DIAGNOSIS — G8929 Other chronic pain: Secondary | ICD-10-CM | POA: Diagnosis not present

## 2015-11-02 DIAGNOSIS — Z791 Long term (current) use of non-steroidal anti-inflammatories (NSAID): Secondary | ICD-10-CM | POA: Diagnosis not present

## 2015-11-02 DIAGNOSIS — J449 Chronic obstructive pulmonary disease, unspecified: Secondary | ICD-10-CM | POA: Diagnosis not present

## 2015-11-02 DIAGNOSIS — M25561 Pain in right knee: Secondary | ICD-10-CM | POA: Diagnosis not present

## 2015-11-02 DIAGNOSIS — Z7984 Long term (current) use of oral hypoglycemic drugs: Secondary | ICD-10-CM | POA: Insufficient documentation

## 2015-11-02 DIAGNOSIS — Z79899 Other long term (current) drug therapy: Secondary | ICD-10-CM | POA: Insufficient documentation

## 2015-11-02 DIAGNOSIS — Z794 Long term (current) use of insulin: Secondary | ICD-10-CM | POA: Insufficient documentation

## 2015-11-02 DIAGNOSIS — I10 Essential (primary) hypertension: Secondary | ICD-10-CM | POA: Insufficient documentation

## 2015-11-02 DIAGNOSIS — F419 Anxiety disorder, unspecified: Secondary | ICD-10-CM | POA: Insufficient documentation

## 2015-11-02 DIAGNOSIS — Z7952 Long term (current) use of systemic steroids: Secondary | ICD-10-CM | POA: Diagnosis not present

## 2015-11-02 DIAGNOSIS — E039 Hypothyroidism, unspecified: Secondary | ICD-10-CM | POA: Insufficient documentation

## 2015-11-02 DIAGNOSIS — E785 Hyperlipidemia, unspecified: Secondary | ICD-10-CM | POA: Insufficient documentation

## 2015-11-02 DIAGNOSIS — Z87891 Personal history of nicotine dependence: Secondary | ICD-10-CM | POA: Diagnosis not present

## 2015-11-02 DIAGNOSIS — E1140 Type 2 diabetes mellitus with diabetic neuropathy, unspecified: Secondary | ICD-10-CM | POA: Diagnosis not present

## 2015-11-02 MED ORDER — KETOROLAC TROMETHAMINE 60 MG/2ML IM SOLN
60.0000 mg | Freq: Once | INTRAMUSCULAR | Status: AC
Start: 2015-11-02 — End: 2015-11-02
  Administered 2015-11-02: 60 mg via INTRAMUSCULAR
  Filled 2015-11-02: qty 2

## 2015-11-02 NOTE — ED Notes (Addendum)
Pt fell 3 years ago at work and injured rt knee.  Pt has had chronic pain since then.  Pain increased today.  No new injury.  Pt drove to hospital today.  Pt took opana ibuprofen and percocet today at 5am for pain

## 2015-11-02 NOTE — ED Provider Notes (Signed)
CSN: [id number]     Arrival date & time 11/02/15  1516 History  By signing my name below, I, Soijett Blue, attest that this documentation has been prepared under the direction and in the presence of Hyman Bible, PA-C Electronically Signed: Soijett Blue, ED Scribe. 11/02/2015. 3:56 PM.   Chief Complaint  Patient presents with  . Knee Pain      The history is provided by the patient. No language interpreter was used.     [name] is a 50 y.o. female with a medical hx of chronic knee pain who presents to the Emergency Department complaining of intermittent, sharp, throbbing, right knee pain onset 1 week ago worsening today. Pt reports that Dr. Alvan Dame is in the process of trying to get a procedure authorized for her right knee. Pt states that she took opana, percocet, and ibuprofen this morning at her pain management clinic this morning. Pt reports that she takes the ibuprofen PRN. Pt denies any new injury at this time. Pt states that Dr. Alvan Dame informed her that she will need a knee replacement following her arthroscopy.  Pt states that her initial injury was while taking the trash out at work and she tripped and fell with +LOC and concussion. Pt is having associated symptoms of gait problem due to pain. She denies fever, swelling, redness, and any other symptoms.    Past Medical History  Diagnosis Date  . Abdominal pain   . Diabetes mellitus     states her doctor took her off all DM meds in past month  . COPD (chronic obstructive pulmonary disease) ([place])   . GERD (gastroesophageal reflux disease)   . Chronic nausea   . Hypothyroidism   . Mental disorder     Bipolar and schizophrenic  . Depression   . Headache(784.0)   . Anxiety   . Atrial fibrillation ([place]) 09/29/11    converted spontaneously  . Chronic pain   . Tobacco abuse   . HTN (hypertension)   . Accidental drug overdose April 2013  . Schizophrenia ([place])   . Hyperlipidemia   . Chronic back pain   . Chronic knee pain    . Diabetic neuropathy [facility])    Past Surgical History  Procedure Laterality Date  . Laparoscopic assisted vaginal hysterectomy  03/04/2011    Procedure: LAPAROSCOPIC ASSISTED VAGINAL HYSTERECTOMY;  Surgeon: Cyril Mourning, MD;  Location: [place] [facility];  Service: Gynecology;  Laterality: N/A;  . Cystocele repair  03/04/2011    Procedure: ANTERIOR REPAIR (CYSTOCELE);  Surgeon: Reece Packer;  Location: [place] [facility];  Service: Urology;  Laterality: N/A;  . Bladder suspension  03/04/2011    Procedure: [place] PROCEDURE;  Surgeon: Elayne Snare MacDiarmid;  Location: [place] [facility];  Service: Urology;  Laterality: N/A;  . Cystoscopy  03/04/2011    Procedure: CYSTOSCOPY;  Surgeon: Elayne Snare MacDiarmid;  Location: [place] [facility];  Service: Urology;  Laterality: N/A;  . Abdominal hysterectomy    . Knee surgery     Family History  Problem Relation Age of Onset  . Heart attack Father     50s  . Heart attack Sister     21  . Diabetes Mother   . Heart disease Mother   . Hypertension Mother   . COPD Other    Social History  Substance Use Topics  . Smoking status: Former Smoker    Quit date: 07/24/2013  . Smokeless tobacco: Never Used  . Alcohol Use: No   OB History    [place]  Para Term Preterm AB TAB SAB Ectopic Multiple Living   3 3   0 0 0 0 0 3     Review of Systems  A complete 10 system review of systems was obtained and all systems are negative except as noted in the HPI and PMH.   Allergies  Aspirin  Home Medications   Prior to Admission medications   Medication Sig Start Date End Date Taking? Authorizing Provider  clonazePAM (KLONOPIN) 0.5 MG tablet Take 0.5 mg by mouth 2 (two) times daily as needed. For anxiety 06/30/15   Historical Provider, MD  diclofenac (VOLTAREN) 50 MG EC tablet Take 1 tablet (50 mg total) by mouth 3 (three) times daily as needed for moderate pain. 07/28/15   Melony Overly, MD  DULoxetine (CYMBALTA) 60 MG capsule Take 60 mg by mouth at bedtime.    Historical Provider, MD  furosemide  (LASIX) 20 MG tablet Take 1 tablet (20 mg total) by mouth daily. 10/14/15   Charlesetta Shanks, MD  gabapentin (NEURONTIN) 400 MG capsule Take 400 mg by mouth 3 (three) times daily. 11/06/14   Historical Provider, MD  hydrOXYzine (ATARAX/VISTARIL) 25 MG tablet Take 25 mg by mouth 3 (three) times daily as needed for anxiety.  11/07/14   Historical Provider, MD  ibuprofen (ADVIL,MOTRIN) 800 MG tablet Take 1 tablet (800 mg total) by mouth 3 (three) times daily. 02/11/15   Charlann Lange, PA-C  Insulin Glargine (LANTUS) 100 UNIT/ML Solostar Pen Inject 160 Units into the skin every morning. And pen needles 2/day 09/06/15   Renato Shin, MD  LATUDA 60 MG TABS Take 60 mg by mouth daily. 06/30/15   Historical Provider, MD  levothyroxine (SYNTHROID, LEVOTHROID) 50 MCG tablet Take 50 mcg by mouth daily. 10/10/14   Historical Provider, MD  lidocaine (LIDODERM) 5 % Place 1 patch onto the skin daily as needed (for pain.).  10/14/14   Historical Provider, MD  lidocaine (LIDODERM) 5 % Place 1 patch onto the skin daily. Remove & Discard patch within 12 hours or as directed by MD 07/09/15   Shawn C Joy, PA-C  LYRICA 75 MG capsule Take 75 mg by mouth 2 (two) times daily. 11/17/14   Historical Provider, MD  metFORMIN (GLUCOPHAGE) 500 MG tablet Take 500 mg by mouth 2 (two) times daily with a meal.    Historical Provider, MD  OPANA ER, CRUSH RESISTANT, 40 MG T12A Take 40 mg by mouth every 8 (eight) hours. 07/06/15   Historical Provider, MD  oxyCODONE-acetaminophen (PERCOCET) 10-325 MG per tablet Take 1 tablet by mouth every 4 (four) hours as needed for pain. 12/02/14   Pamella Pert, MD  prednisoLONE acetate (PRED FORTE) 1 % ophthalmic suspension Place 1 drop into both eyes 2 (two) times daily. Not started yet 07/05/15   Historical Provider, MD  predniSONE (DELTASONE) 50 MG tablet Take 1 pill daily for 5 days. 10/13/15   Melony Overly, MD  PROAIR HFA 108 (90 Base) MCG/ACT inhaler Inhale 2 puffs into the lungs every 6 (six) hours as needed.  06/15/15   Historical Provider, MD  RESTASIS 0.05 % ophthalmic emulsion PLACE 1 DROP IN [facility] EYE TWICE A DAY 06/27/15   Historical Provider, MD  simvastatin (ZOCOR) 20 MG tablet Take 20 mg by mouth daily. 02/08/15   Historical Provider, MD  SUMAtriptan 6 MG/0.5ML SOAJ Inject 6 mg into the muscle daily as needed. For headaches. 06/09/15   Historical Provider, MD  topiramate (TOPAMAX) 50 MG tablet Take 150-200 mg by mouth  at bedtime. Reported on 07/04/2015 10/10/14   Historical Provider, MD  traZODone (DESYREL) 100 MG tablet Take 200 mg by mouth at bedtime.    Robbie Lis, MD   BP 118/83 mmHg  Pulse 107  Temp(Src) 98.2 F (36.8 C) (Oral)  Resp 20  SpO2 96%  LMP 01/08/2011 Physical Exam  Constitutional: She is oriented to person, place, and time. She appears well-developed and well-nourished. No distress.  HENT:  Head: Normocephalic and atraumatic.  Eyes: EOM are normal.  Neck: Neck supple.  Cardiovascular: Normal rate.   Pulmonary/Chest: Effort normal. No respiratory distress.  Abdominal: She exhibits no distension.  Musculoskeletal: Normal range of motion.  Pain with flexion and extension of the right knee. FROM. No erythema or warmth. No obvious effusion. 2+ dp pulse.   Neurological: She is alert and oriented to person, place, and time.  Skin: Skin is warm and dry.  Psychiatric: She has a normal mood and affect. Her behavior is normal.  Nursing note and vitals reviewed.   ED Course  Procedures (including critical care time) DIAGNOSTIC STUDIES: Oxygen Saturation is 96% on RA, nl by my interpretation.    COORDINATION OF CARE: 3:52 PM Discussed treatment plan with pt at bedside which includes toradol injection and pt agreed to plan.  4:58 PM- Pt re-evaluated and right knee pain improved following treatment.    Labs Review Labs Reviewed - No data to display  Imaging Review No results found.    EKG Interpretation None      MDM   Final diagnoses:  None    Patient  with chronic knee pain, do not feel that imaging is indicated at this time due to the pt not having any new injury.  No signs of infection.  Pt given toradol injection while in ED.  Significant improvement in pain after given Toradol.  Pt advised to follow up with Dr. Alvan Dame. Conservative therapy recommended and discussed. Patient will be discharged home & is agreeable with above plan. Returns precautions discussed. Pt appears safe for discharge.  I personally performed the services described in this documentation, which was scribed in my presence. The recorded information has been reviewed and is accurate.    Hyman Bible, PA-C 11/02/15 1928  Hyman Bible, PA-C 11/02/15 [place], MD 11/05/15 [phone]

## 2015-11-08 ENCOUNTER — Ambulatory Visit (INDEPENDENT_AMBULATORY_CARE_PROVIDER_SITE_OTHER): Payer: Medicare Other | Admitting: Endocrinology

## 2015-11-08 VITALS — HR 96 | Wt 222.2 lb

## 2015-11-08 DIAGNOSIS — Z794 Long term (current) use of insulin: Secondary | ICD-10-CM

## 2015-11-08 DIAGNOSIS — E1142 Type 2 diabetes mellitus with diabetic polyneuropathy: Secondary | ICD-10-CM

## 2015-11-08 LAB — POCT GLYCOSYLATED HEMOGLOBIN (HGB A1C): Hemoglobin A1C: 8.4

## 2015-11-08 MED ORDER — INSULIN GLARGINE 100 UNIT/ML SOLOSTAR PEN: 180.0000 [IU] | PEN_INJECTOR | SUBCUTANEOUS | Status: DC

## 2015-11-08 NOTE — Progress Notes (Signed)
Subjective:    Patient ID: [name], female    DOB: 01-28-1966, 50 y.o.   MRN: [id number]  HPI Pt returns for f/u of diabetes mellitus: DM type: Insulin-requiring type 2 Dx'ed: [id number] Complications: none.   Therapy: insulin since 2013 GDM: never DKA: never Severe hypoglycemia: never.  Pancreatitis: never.  Other: she takes QD insulin, due to forgetting more frequent doses.  Interval history:  no cbg record, but states cbg's are mostly in the high-100's.  It is lowest in the afternoon.  This happens after she misses lunch.  She will have bariatric surgery soon.  Past Medical History  Diagnosis Date  . Abdominal pain   . Diabetes mellitus     states her doctor took her off all DM meds in past month  . COPD (chronic obstructive pulmonary disease) ([place])   . GERD (gastroesophageal reflux disease)   . Chronic nausea   . Hypothyroidism   . Mental disorder     Bipolar and schizophrenic  . Depression   . Headache(784.0)   . Anxiety   . Atrial fibrillation ([place]) 09/29/11    converted spontaneously  . Chronic pain   . Tobacco abuse   . HTN (hypertension)   . Accidental drug overdose April 2013  . Schizophrenia ([place])   . Hyperlipidemia   . Chronic back pain   . Chronic knee pain   . Diabetic neuropathy [facility])     Past Surgical History  Procedure Laterality Date  . Laparoscopic assisted vaginal hysterectomy  03/04/2011    Procedure: LAPAROSCOPIC ASSISTED VAGINAL HYSTERECTOMY;  Surgeon: Cyril Mourning, MD;  Location: [facility];  Service: Gynecology;  Laterality: N/A;  . Cystocele repair  03/04/2011    Procedure: ANTERIOR REPAIR (CYSTOCELE);  Surgeon: Reece Packer;  Location: [facility];  Service: Urology;  Laterality: N/A;  . Bladder suspension  03/04/2011    Procedure: [place] PROCEDURE;  Surgeon: Elayne Snare MacDiarmid;  Location: [facility];  Service: Urology;  Laterality: N/A;  . Cystoscopy  03/04/2011    Procedure: CYSTOSCOPY;  Surgeon: Elayne Snare MacDiarmid;  Location: [facility];   Service: Urology;  Laterality: N/A;  . Abdominal hysterectomy    . Knee surgery      Social History   Social History  . Marital Status: Legally Separated    Spouse Name: N/A  . Number of Children: N/A  . Years of Education: N/A   Occupational History  . Not on file.   Social History Main Topics  . Smoking status: Former Smoker    Quit date: 07/24/2013  . Smokeless tobacco: Never Used  . Alcohol Use: No  . Drug Use: No  . Sexual Activity:    Partners: Male   Other Topics Concern  . Not on file   Social History Narrative    Current Outpatient Prescriptions on File Prior to Visit  Medication Sig Dispense Refill  . clonazePAM (KLONOPIN) 0.5 MG tablet Take 0.5 mg by mouth 2 (two) times daily as needed. For anxiety    . diclofenac (VOLTAREN) 50 MG EC tablet Take 1 tablet (50 mg total) by mouth 3 (three) times daily as needed for moderate pain. 30 tablet 0  . DULoxetine (CYMBALTA) 60 MG capsule Take 60 mg by mouth at bedtime.    . furosemide (LASIX) 20 MG tablet Take 1 tablet (20 mg total) by mouth daily. 10 tablet 0  . gabapentin (NEURONTIN) 400 MG capsule Take 400 mg by mouth 3 (three) times daily.    Marland Kitchen  hydrOXYzine (ATARAX/VISTARIL) 25 MG tablet Take 25 mg by mouth 3 (three) times daily as needed for anxiety.     Marland Kitchen ibuprofen (ADVIL,MOTRIN) 800 MG tablet Take 1 tablet (800 mg total) by mouth 3 (three) times daily. 21 tablet 0  . LATUDA 60 MG TABS Take 60 mg by mouth daily.    Marland Kitchen levothyroxine (SYNTHROID, LEVOTHROID) 50 MCG tablet Take 50 mcg by mouth daily.  5  . lidocaine (LIDODERM) 5 % Place 1 patch onto the skin daily as needed (for pain.).   2  . lidocaine (LIDODERM) 5 % Place 1 patch onto the skin daily. Remove & Discard patch within 12 hours or as directed by MD 30 patch 0  . LYRICA 75 MG capsule Take 75 mg by mouth 2 (two) times daily.  2  . metFORMIN (GLUCOPHAGE) 500 MG tablet Take 500 mg by mouth 2 (two) times daily with a meal.    . OPANA ER, CRUSH RESISTANT, 40 MG  T12A Take 40 mg by mouth every 8 (eight) hours.    Marland Kitchen oxyCODONE-acetaminophen (PERCOCET) 10-325 MG per tablet Take 1 tablet by mouth every 4 (four) hours as needed for pain. 15 tablet 0  . prednisoLONE acetate (PRED FORTE) 1 % ophthalmic suspension Place 1 drop into both eyes 2 (two) times daily. Not started yet    . predniSONE (DELTASONE) 50 MG tablet Take 1 pill daily for 5 days. 5 tablet 0  . PROAIR HFA 108 (90 Base) MCG/ACT inhaler Inhale 2 puffs into the lungs every 6 (six) hours as needed.  5  . RESTASIS 0.05 % ophthalmic emulsion PLACE 1 DROP IN EACH EYE TWICE A DAY  3  . simvastatin (ZOCOR) 20 MG tablet Take 20 mg by mouth daily.  5  . SUMAtriptan 6 MG/0.5ML SOAJ Inject 6 mg into the muscle daily as needed. For headaches.  5  . topiramate (TOPAMAX) 50 MG tablet Take 150-200 mg by mouth at bedtime. Reported on 07/04/2015  12  . traZODone (DESYREL) 100 MG tablet Take 200 mg by mouth at bedtime.     No current facility-administered medications on file prior to visit.    Allergies  Allergen Reactions  . Aspirin Other (See Comments)    Ulcer; Does fine with ibuprofen & Tylenol    Family History  Problem Relation Age of Onset  . Heart attack Father     37s  . Heart attack Sister     79  . Diabetes Mother   . Heart disease Mother   . Hypertension Mother   . COPD Other     Pulse 96  Wt 222 lb 3.2 oz (100.789 kg)  SpO2 91%  LMP 01/08/2011   Review of Systems She denies LOC.     Objective:   Physical Exam VITAL SIGNS:  See vs page GENERAL: no distress Pulses: dorsalis pedis intact bilat.   MSK: no deformity of the feet.  CV: 1+ bilat leg edema.  Skin:  no ulcer on the feet.  normal color and temp on the feet.  Neuro: sensation is intact to touch on the feet.     Lab Results  Component Value Date   HGBA1C 8.4 11/08/2015      Assessment & Plan:  DM: she needs increased rx  Patient is advised the following: Patient Instructions  check your blood sugar twice a  day.  vary the time of day when you check, between before the 3 meals, and at bedtime.  also check if you  have symptoms of your blood sugar being too high or too low.  please keep a record of the readings and bring it to your next appointment here.  You can write it on any piece of paper.  please call us sooner if your blood sugar goes below 70, or if you have a lot of readings over 200. Please increase the lantus to 180 units each morning.   On this type of insulin schedule, you should eat meals on a regular schedule.  If a meal is missed or significantly delayed, your blood sugar could go low.  Please come back for a follow-up appointment in 3 months.

## 2015-11-08 NOTE — Patient Instructions (Addendum)
check your blood sugar twice a day.  vary the time of day when you check, between before the 3 meals, and at bedtime.  also check if you have symptoms of your blood sugar being too high or too low.  please keep a record of the readings and bring it to your next appointment here.  You can write it on any piece of paper.  please call us sooner if your blood sugar goes below 70, or if you have a lot of readings over 200. Please increase the lantus to 180 units each morning.   On this type of insulin schedule, you should eat meals on a regular schedule.  If a meal is missed or significantly delayed, your blood sugar could go low.  Please come back for a follow-up appointment in 3 months.

## 2015-11-23 ENCOUNTER — Ambulatory Visit
Admission: RE | Admit: 2015-11-23 | Discharge: 2015-11-23 | Disposition: A | Discharge status: Home / Self Care | Payer: Medicare Other | Source: Ambulatory Visit

## 2015-11-23 DIAGNOSIS — Z1231 Encounter for screening mammogram for malignant neoplasm of breast: Secondary | ICD-10-CM

## 2015-12-03 ENCOUNTER — Telehealth: Payer: Self-pay | Admitting: Endocrinology

## 2015-12-03 ENCOUNTER — Encounter: Payer: Medicare Other | Attending: General Surgery | Admitting: Dietician

## 2015-12-03 ENCOUNTER — Encounter: Payer: Self-pay | Admitting: Dietician

## 2015-12-03 DIAGNOSIS — E1142 Type 2 diabetes mellitus with diabetic polyneuropathy: Secondary | ICD-10-CM | POA: Diagnosis present

## 2015-12-03 DIAGNOSIS — I10 Essential (primary) hypertension: Secondary | ICD-10-CM | POA: Insufficient documentation

## 2015-12-03 DIAGNOSIS — Z6841 Body Mass Index (BMI) 40.0 and over, adult: Secondary | ICD-10-CM | POA: Diagnosis not present

## 2015-12-03 NOTE — Patient Instructions (Addendum)
Start eating 3 meals per day. (Meal = mostly meat/egg and vegetables-greens,cabbage,salads,carrots,green beans) If you have a low blood sugar, have 1/2 cup juice/sweet drink (small juice box) or  glucose tablets (keep in your purse). Test blood sugar after 15 minutes. If it is above 70 mg/gl have peanut butter and crackers. Start working out cutting back on carbs (bread, pasta, rice, grits, cereal, crackers, chips, potatoes, sweet potatoes, corn, peas) Keep working on chewing foods well. Continue to work on cutting back foods with sugar (jelly beans).  Stop buying cashews if they are a temptation. Start working on not drinking 15 minutes before you eat and waiting until 30 minutes after you eat before drinking. For yogurt, choose ones that have less than 15 g of carbohydrates or less ([place] light and fit or Triple [place] Zero).   -Try Atkins protein shake or EAS AdvantEdge protein shake -Try having more grilled foods rather than fried foods -Use smoked [place] neck or lean ham to season slow cooker meals -Try Skinny Girl or [employer] dressings

## 2015-12-03 NOTE — Telephone Encounter (Signed)
See below to be advised, Thanks!

## 2015-12-03 NOTE — Telephone Encounter (Signed)
PT came by office and wanted to let you know that her PCP changed her insulin to [place] 300 units

## 2015-12-03 NOTE — Progress Notes (Signed)
  6 Months Supervised Weight Loss Visit:   Pre-Operative RYGB Surgery  Medical Nutrition Therapy:  Appt start time: 930 end time:  945  Primary concerns today: Supervised Weight Loss Visit #3. [name] returns with a 7 lb weight loss. She reports that she is surprised to have lost weight since her insulin was recently increased. She does not like Premier protein shakes and wants to try another brand. Drinking plenty of water with sugar free flavoring. She reports that she is still not eating regular meals but she does eat when she takes her insulin. Patient states that she has been doing a good job chewing thoroughly. Still having some fried foods and knows that she needs to cut back on these. Still working on cutting back on sugary foods like jellybeans.   Weight: 214.5 lbs BMI: 40.6  Preferred Learning Style:   No preference indicated   Learning Readiness:   Ready  Medications: see list  Recent physical activity:  unknown  Progress Towards Goal(s):  In progress.  Handouts given during visit include:  none   Nutritional Diagnosis:  [place]-3.3 Obesity related to past poor dietary habits and physical inactivity as evidenced by patient attending supervised weight loss for insurance approval of bariatric surgery.    Intervention:  Nutrition counseling provided. Plan: Start eating 3 meals per day. (Meal = mostly meat/egg and vegetables-greens,cabbage,salads,carrots,green beans) If you have a low blood sugar, have 1/2 cup juice/sweet drink (small juice box) or  glucose tablets (keep in your purse). Test blood sugar after 15 minutes. If it is above 70 mg/gl have peanut butter and crackers. Start working out cutting back on carbs (bread, pasta, rice, grits, cereal, crackers, chips, potatoes, sweet potatoes, corn, peas) Keep working on chewing foods well. Continue to work on cutting back foods with sugar (jelly beans).  Stop buying cashews if they are a temptation. Start working on not  drinking 15 minutes before you eat and waiting until 30 minutes after you eat before drinking. For yogurt, choose ones that have less than 15 g of carbohydrates or less ([place] light and fit or Triple [place] Zero).  -Try Atkins protein shake or EAS AdvantEdge protein shake -Try having more grilled foods rather than fried foods -Use smoked [place] neck or lean ham to season slow cooker meals -Try Skinny Girl or [employer] salad dressings  Teaching Method Utilized:  Visual Auditory Hands on  Barriers to learning/adherence to lifestyle change: unstructured meal schedule  Demonstrated degree of understanding via:  Teach Back   Monitoring/Evaluation:  Dietary intake, exercise, and body weight. Follow up in 1 months for 6 month supervised weight loss visit.

## 2015-12-24 ENCOUNTER — Ambulatory Visit: Payer: Self-pay | Admitting: Skilled Nursing Facility1

## 2015-12-28 ENCOUNTER — Encounter: Payer: Self-pay | Admitting: Skilled Nursing Facility1

## 2015-12-28 ENCOUNTER — Encounter: Payer: Medicare Other | Attending: General Surgery | Admitting: Skilled Nursing Facility1

## 2015-12-28 VITALS — Ht 61.0 in | Wt 207.0 lb

## 2015-12-28 DIAGNOSIS — E669 Obesity, unspecified: Secondary | ICD-10-CM

## 2015-12-28 DIAGNOSIS — I10 Essential (primary) hypertension: Secondary | ICD-10-CM | POA: Insufficient documentation

## 2015-12-28 DIAGNOSIS — Z6841 Body Mass Index (BMI) 40.0 and over, adult: Secondary | ICD-10-CM | POA: Insufficient documentation

## 2015-12-28 DIAGNOSIS — E1142 Type 2 diabetes mellitus with diabetic polyneuropathy: Secondary | ICD-10-CM | POA: Insufficient documentation

## 2015-12-28 NOTE — Patient Instructions (Signed)
-  Try a protein powder in either water, unsweetened soy milk, unsweetened almond milk -Try to only see any fast food once a week -Add nuts to your yogurt and that is the only time to eat your nuts

## 2015-12-28 NOTE — Progress Notes (Signed)
  6 Months Supervised Weight Loss Visit:   Pre-Operative RYGB Surgery  Medical Nutrition Therapy:  Appt start time: 930 end time:  945  Primary concerns today: Supervised Weight Loss Visit #4. Pt returns having lost 7 pounds. Pt states still Eats fried chicken at [employer]. Pt states she has not liked any of the protein shakes she has tried except for one that she could not remember and she thinks may have too many carbohydrates. Pt was confused about what are carbohydrates and how they related to her wt journey. Pt states her doctor referred her to a wt loss program in [facility] but states the food was disgusting.   Recall: Cheese Grilled meat, greens, baked beans  Protein shake Chips and cheese popcorn  Weight: 207 lbs BMI: 39.2 Preferred Learning Style:   No preference indicated   Learning Readiness:   Ready  Medications: see list  Recent physical activity:  unknown  Progress Towards Goal(s):  In progress.  Handouts given during visit include:  none   Nutritional Diagnosis:  [place]-3.3 Obesity related to past poor dietary habits and physical inactivity as evidenced by patient attending supervised weight loss for insurance approval of bariatric surgery.    Intervention:  Nutrition counseling provided. Plan: -Try a protein powder in either water, unsweetened soy milk, unsweetened almond milk -Try to only see any fast food once a week -Add nuts to your yogurt and that is the only time to eat your nuts -Try the protein powder  Teaching Method Utilized:  Visual Auditory Hands on  Barriers to learning/adherence to lifestyle change: unstructured meal schedule  Demonstrated degree of understanding via:  Teach Back   Monitoring/Evaluation:  Dietary intake, exercise, and body weight. Follow up in 1 months for 6 month supervised weight loss visit.

## 2016-01-17 ENCOUNTER — Encounter (HOSPITAL_COMMUNITY): Payer: Self-pay | Admitting: *Deleted

## 2016-01-17 ENCOUNTER — Emergency Department (HOSPITAL_COMMUNITY)
Admission: EM | Admit: 2016-01-17 | Discharge: 2016-01-17 | Disposition: A | Discharge status: Home / Self Care | Payer: Medicare Other | Attending: Emergency Medicine | Admitting: Emergency Medicine

## 2016-01-17 ENCOUNTER — Emergency Department (HOSPITAL_COMMUNITY): Payer: Medicare Other

## 2016-01-17 DIAGNOSIS — E119 Type 2 diabetes mellitus without complications: Secondary | ICD-10-CM | POA: Diagnosis not present

## 2016-01-17 DIAGNOSIS — R197 Diarrhea, unspecified: Secondary | ICD-10-CM | POA: Diagnosis not present

## 2016-01-17 DIAGNOSIS — I4891 Unspecified atrial fibrillation: Secondary | ICD-10-CM | POA: Insufficient documentation

## 2016-01-17 DIAGNOSIS — Z794 Long term (current) use of insulin: Secondary | ICD-10-CM | POA: Diagnosis not present

## 2016-01-17 DIAGNOSIS — R112 Nausea with vomiting, unspecified: Secondary | ICD-10-CM | POA: Diagnosis not present

## 2016-01-17 DIAGNOSIS — J449 Chronic obstructive pulmonary disease, unspecified: Secondary | ICD-10-CM | POA: Insufficient documentation

## 2016-01-17 DIAGNOSIS — Z7984 Long term (current) use of oral hypoglycemic drugs: Secondary | ICD-10-CM | POA: Insufficient documentation

## 2016-01-17 DIAGNOSIS — E785 Hyperlipidemia, unspecified: Secondary | ICD-10-CM | POA: Insufficient documentation

## 2016-01-17 DIAGNOSIS — I10 Essential (primary) hypertension: Secondary | ICD-10-CM | POA: Insufficient documentation

## 2016-01-17 DIAGNOSIS — Z87891 Personal history of nicotine dependence: Secondary | ICD-10-CM | POA: Diagnosis not present

## 2016-01-17 DIAGNOSIS — F329 Major depressive disorder, single episode, unspecified: Secondary | ICD-10-CM | POA: Insufficient documentation

## 2016-01-17 DIAGNOSIS — E039 Hypothyroidism, unspecified: Secondary | ICD-10-CM | POA: Insufficient documentation

## 2016-01-17 DIAGNOSIS — Z79899 Other long term (current) drug therapy: Secondary | ICD-10-CM | POA: Insufficient documentation

## 2016-01-17 DIAGNOSIS — R1030 Lower abdominal pain, unspecified: Secondary | ICD-10-CM | POA: Diagnosis not present

## 2016-01-17 LAB — COMPREHENSIVE METABOLIC PANEL
ALBUMIN: 4.2 g/dL (ref 3.5–5.0)
ALK PHOS: 101 U/L (ref 38–126)
ALT: 21 U/L (ref 14–54)
AST: 18 U/L (ref 15–41)
Anion gap: 8 (ref 5–15)
BILIRUBIN TOTAL: 0.9 mg/dL (ref 0.3–1.2)
BUN: 11 mg/dL (ref 6–20)
CALCIUM: 9.5 mg/dL (ref 8.9–10.3)
CO2: 28 mmol/L (ref 22–32)
Chloride: 104 mmol/L (ref 101–111)
Creatinine, Ser: 0.79 mg/dL (ref 0.44–1.00)
GFR calc Af Amer: >60 mL/min (ref >60)
GFR calc non Af Amer: >60 mL/min (ref >60)
GLUCOSE: 147 mg/dL — AB (ref 65–99)
Potassium: 3.8 mmol/L (ref 3.5–5.1)
Sodium: 140 mmol/L (ref 135–145)
TOTAL PROTEIN: 7.6 g/dL (ref 6.5–8.1)

## 2016-01-17 LAB — CBC WITH DIFFERENTIAL/PLATELET
BASOS PCT: 0 %
Basophils Absolute: 0 10*3/uL (ref 0.0–0.1)
EOS PCT: 0 %
Eosinophils Absolute: 0 10*3/uL (ref 0.0–0.7)
HCT: 39.4 % (ref 36.0–46.0)
Hemoglobin: 13 g/dL (ref 12.0–15.0)
LYMPHS PCT: 15 %
Lymphs Abs: 1.5 10*3/uL (ref 0.7–4.0)
MCH: 26.7 pg (ref 26.0–34.0)
MCHC: 33 g/dL (ref 30.0–36.0)
MCV: 81.1 fL (ref 78.0–100.0)
MONO ABS: 0.3 10*3/uL (ref 0.1–1.0)
MONOS PCT: 3 %
Neutro Abs: 8 10*3/uL — ABNORMAL HIGH (ref 1.7–7.7)
Neutrophils Relative %: 82 %
PLATELETS: 234 10*3/uL (ref 150–400)
RBC: 4.86 MIL/uL (ref 3.87–5.11)
RDW: 13.6 % (ref 11.5–15.5)
WBC: 9.7 10*3/uL (ref 4.0–10.5)

## 2016-01-17 LAB — I-STAT CG4 LACTIC ACID, ED
LACTIC ACID, VENOUS: 2.65 mmol/L — AB (ref 0.5–1.9)
Lactic Acid, Venous: 1.67 mmol/L (ref 0.5–1.9)

## 2016-01-17 LAB — LIPASE, BLOOD: Lipase: 19 U/L (ref 11–51)

## 2016-01-17 LAB — MAGNESIUM: Magnesium: 1.7 mg/dL (ref 1.7–2.4)

## 2016-01-17 MED ORDER — IOPAMIDOL (ISOVUE-300) INJECTION 61%
100.0000 mL | Freq: Once | INTRAVENOUS | Status: AC | PRN
  Administered 2016-01-17: 100 mL via INTRAVENOUS

## 2016-01-17 MED ORDER — LORAZEPAM 2 MG/ML IJ SOLN
0.5000 mg | Freq: Once | INTRAMUSCULAR | Status: AC
  Administered 2016-01-17: 0.5 mg via INTRAVENOUS
  Filled 2016-01-17: qty 1

## 2016-01-17 MED ORDER — PROMETHAZINE HCL 25 MG PO TABS: 25.0000 mg | ORAL_TABLET | Freq: Three times a day (TID) | ORAL | 0 refills | Status: DC | PRN

## 2016-01-17 MED ORDER — SODIUM CHLORIDE 0.9 % IV BOLUS (SEPSIS)
1000.0000 mL | Freq: Once | INTRAVENOUS | Status: AC
  Administered 2016-01-17: 1000 mL via INTRAVENOUS

## 2016-01-17 MED ORDER — DICYCLOMINE HCL 10 MG PO CAPS
10.0000 mg | ORAL_CAPSULE | Freq: Once | ORAL | Status: AC
  Administered 2016-01-17: 10 mg via ORAL
  Filled 2016-01-17: qty 1

## 2016-01-17 MED ORDER — HYDROMORPHONE HCL 1 MG/ML IJ SOLN
1.0000 mg | Freq: Once | INTRAMUSCULAR | Status: AC
  Administered 2016-01-17: 1 mg via INTRAVENOUS
  Filled 2016-01-17: qty 1

## 2016-01-17 MED ORDER — PROMETHAZINE HCL 25 MG/ML IJ SOLN
25.0000 mg | Freq: Once | INTRAMUSCULAR | Status: AC
  Administered 2016-01-17: 25 mg via INTRAMUSCULAR
  Filled 2016-01-17 (×2): qty 1

## 2016-01-17 MED ORDER — KETOROLAC TROMETHAMINE 15 MG/ML IJ SOLN
15.0000 mg | Freq: Once | INTRAMUSCULAR | Status: AC
  Administered 2016-01-17: 15 mg via INTRAVENOUS
  Filled 2016-01-17: qty 1

## 2016-01-17 NOTE — ED Notes (Signed)
Bed: [id number] Expected date:  Expected time:  Means of arrival:  Comments: 11F/n/v/d/zofran en route

## 2016-01-17 NOTE — ED Triage Notes (Signed)
Per EMS - patient comes from home with N/V/D that began this morning.  Patient also c/o chronic back pain, for which she takes a number of meds.  Patient's vitals 162/94, HR 94, 98% on RA, RR 20 CBG 130.  Patient received 8 mg IVP en route.  22 gauge in RPFA.

## 2016-01-17 NOTE — ED Provider Notes (Signed)
[facility] DEPT Provider Note   CSN: [id number] Arrival date & time: 01/17/16  [id number]  First Provider Contact:  First MD Initiated Contact with Patient 01/17/16 [phone]        History   Chief Complaint Chief Complaint  Patient presents with  . Emesis  . Diarrhea    HPI ANTOINNETTE White is a 50 y.o. female.  The history is provided by the patient. No language interpreter was used.  Emesis   Associated symptoms include diarrhea and vomiting.  Diarrhea     [name] is a 50 y.o. female who presents to the Emergency Department complaining of vomiting/diarrhea. She reports severe vomiting and diarrhea starting at 4 AM. She experienced numerous episodes of emesis. No reports of fever. She reports lower abdominal pain. No dysuria, fevers. She takes chronic pain medications for chronic back pain, her last doses were yesterday afternoon. She has not been able to take any additional doses due to vomiting. No known sick contacts and no known bad food exposures. Symptoms are severe, constant, worsening. She is also experiencing intermittent cramps of her calves.  Past Medical History:  Diagnosis Date  . Abdominal pain   . Accidental drug overdose April 2013  . Anxiety   . Atrial fibrillation ([place]) 09/29/11   converted spontaneously  . Chronic back pain   . Chronic knee pain   . Chronic nausea   . Chronic pain   . COPD (chronic obstructive pulmonary disease) ([place])   . Depression   . Diabetes mellitus    states her doctor took her off all DM meds in past month  . Diabetic neuropathy ([place])   . GERD (gastroesophageal reflux disease)   . Headache(784.0)   . HTN (hypertension)   . Hyperlipidemia   . Hypothyroidism   . Mental disorder    Bipolar and schizophrenic  . Schizophrenia ([place])   . Tobacco abuse     Patient Active Problem List   Diagnosis Date Noted  . Type 2 diabetes mellitus with diabetic polyneuropathy, with long-term current use of insulin ([place]) 09/06/2015  .  Vaginitis and vulvovaginitis, unspecified 04/19/2013  . BV (bacterial vaginosis) 04/19/2013  . HTN (hypertension) 11/04/2011  . Hypokalemia 10/02/2011  . atrial fibrillation 09/30/2011  . Acute respiratory failure ([place]) 09/29/2011  . COPD (chronic obstructive pulmonary disease) ([place]) 09/29/2011  . Accidental drug overdose 09/29/2011    Past Surgical History:  Procedure Laterality Date  . ABDOMINAL HYSTERECTOMY    . BLADDER SUSPENSION  03/04/2011   Procedure: [facility] PROCEDURE;  Surgeon: Elayne Snare MacDiarmid;  Location: [facility];  Service: Urology;  Laterality: N/A;  . CYSTOCELE REPAIR  03/04/2011   Procedure: ANTERIOR REPAIR (CYSTOCELE);  Surgeon: Reece Packer;  Location: [facility];  Service: Urology;  Laterality: N/A;  . CYSTOSCOPY  03/04/2011   Procedure: CYSTOSCOPY;  Surgeon: Elayne Snare MacDiarmid;  Location: [facility];  Service: Urology;  Laterality: N/A;  . KNEE SURGERY    . LAPAROSCOPIC ASSISTED VAGINAL HYSTERECTOMY  03/04/2011   Procedure: LAPAROSCOPIC ASSISTED VAGINAL HYSTERECTOMY;  Surgeon: Cyril Mourning, MD;  Location: [facility];  Service: Gynecology;  Laterality: N/A;    OB History    Gravida Para Term Preterm AB Living   3 3     0 3   SAB TAB Ectopic Multiple Live Births   0 0 0 0         Home Medications    Prior to Admission medications   Medication Sig Start Date End Date Taking?  Authorizing Provider  clonazePAM (KLONOPIN) 0.5 MG tablet Take 0.5 mg by mouth 2 (two) times daily as needed. For anxiety 06/30/15  Yes Historical Provider, MD  DULoxetine (CYMBALTA) 60 MG capsule Take 60 mg by mouth at bedtime.   Yes Historical Provider, MD  hydrOXYzine (ATARAX/VISTARIL) 50 MG tablet Take 50 mg by mouth 3 (three) times daily as needed. 01/14/16  Yes Historical Provider, MD  ibuprofen (ADVIL,MOTRIN) 800 MG tablet Take 1 tablet (800 mg total) by mouth 3 (three) times daily. 02/11/15  Yes Shari Upstill, PA-C  LATUDA 80 MG TABS tablet Take 80 mg by mouth daily. 01/15/16  Yes Historical  Provider, MD  levothyroxine (SYNTHROID, LEVOTHROID) 50 MCG tablet Take 50 mcg by mouth daily. 10/10/14  Yes Historical Provider, MD  LYRICA 75 MG capsule Take 75 mg by mouth 2 (two) times daily. 11/17/14  Yes Historical Provider, MD  metFORMIN (GLUCOPHAGE) 500 MG tablet Take 500 mg by mouth 2 (two) times daily with a meal.   Yes Historical Provider, MD  OPANA ER, CRUSH RESISTANT, 40 MG T12A Take 40 mg by mouth every 8 (eight) hours. 07/06/15  Yes Historical Provider, MD  oxyCODONE-acetaminophen (PERCOCET) 10-325 MG per tablet Take 1 tablet by mouth every 4 (four) hours as needed for pain. 12/02/14  Yes Pamella Pert, MD  pantoprazole (PROTONIX) 40 MG tablet Take 40 mg by mouth daily. 01/14/16  Yes Historical Provider, MD  PROAIR HFA 108 (90 Base) MCG/ACT inhaler Inhale 2 puffs into the lungs every 6 (six) hours as needed for wheezing or shortness of breath.  06/15/15  Yes Historical Provider, MD  RESTASIS 0.05 % ophthalmic emulsion PLACE 1 DROP IN [facility] EYE TWICE A DAY 06/27/15  Yes Historical Provider, MD  simvastatin (ZOCOR) 20 MG tablet Take 20 mg by mouth daily. 02/08/15  Yes Historical Provider, MD  SUMAtriptan 6 MG/0.5ML SOAJ Inject 6 mg into the muscle daily as needed. For headaches. 06/09/15  Yes Historical Provider, MD  topiramate (TOPAMAX) 50 MG tablet Take 150-200 mg by mouth at bedtime. Reported on 07/04/2015 10/10/14  Yes Historical Provider, MD  TOUJEO SOLOSTAR 300 UNIT/ML SOPN Inject 180 Units into the skin daily.  01/15/16  Yes Historical Provider, MD  traZODone (DESYREL) 100 MG tablet Take 200 mg by mouth at bedtime.   Yes Robbie Lis, MD  diclofenac (VOLTAREN) 50 MG EC tablet Take 1 tablet (50 mg total) by mouth 3 (three) times daily as needed for moderate pain. Patient not taking: Reported on 01/17/2016 07/28/15   Melony Overly, MD  furosemide (LASIX) 20 MG tablet Take 1 tablet (20 mg total) by mouth daily. Patient not taking: Reported on 01/17/2016 10/14/15   Charlesetta Shanks, MD  Insulin  Glargine (LANTUS) 100 UNIT/ML Solostar Pen Inject 180 Units into the skin every morning. And pen needles 2/day 11/08/15   Renato Shin, MD  lidocaine (LIDODERM) 5 % Place 1 patch onto the skin daily. Remove & Discard patch within 12 hours or as directed by MD Patient not taking: Reported on 01/17/2016 07/09/15   Shawn C Joy, PA-C  predniSONE (DELTASONE) 50 MG tablet Take 1 pill daily for 5 days. Patient not taking: Reported on 01/17/2016 10/13/15   Melony Overly, MD  promethazine (PHENERGAN) 25 MG tablet Take 1 tablet (25 mg total) by mouth every 8 (eight) hours as needed for nausea or vomiting. 01/17/16   Quintella Reichert, MD    Family History Family History  Problem Relation Age of Onset  . Heart attack Father  20s  . Diabetes Mother   . Heart disease Mother   . Hypertension Mother   . Heart attack Sister     65  . COPD Other     Social History Social History  Substance Use Topics  . Smoking status: Former Smoker    Quit date: 07/24/2013  . Smokeless tobacco: Never Used  . Alcohol use No     Allergies   Aspirin   Review of Systems Review of Systems  Gastrointestinal: Positive for diarrhea and vomiting.  All other systems reviewed and are negative.    Physical Exam Updated Vital Signs BP 121/75 (BP Location: Right Arm)   Pulse 102   Temp 98.9 F (37.2 C) (Oral)   Resp 18   LMP 01/08/2011   SpO2 96%   Physical Exam  Constitutional: She is oriented to person, place, and time. She appears well-developed and well-nourished.  Uncomfortable appearing  HENT:  Head: Normocephalic and atraumatic.  Cardiovascular: Normal rate and regular rhythm.   No murmur heard. Pulmonary/Chest: Effort normal and breath sounds normal. No respiratory distress.  Abdominal: Soft.  Moderate to severe diffuse abdominal tenderness  Musculoskeletal: She exhibits no edema or tenderness.  Neurological: She is alert and oriented to person, place, and time.  Skin: Skin is warm and dry.    Psychiatric: She has a normal mood and affect. Her behavior is normal.  Nursing note and vitals reviewed.    ED Treatments / Results  Labs (all labs ordered are listed, but only abnormal results are displayed) Labs Reviewed  COMPREHENSIVE METABOLIC PANEL - Abnormal; Notable for the following:       Result Value   Glucose, Bld 147 (*)    All other components within normal limits  CBC WITH DIFFERENTIAL/PLATELET - Abnormal; Notable for the following:    Neutro Abs 8.0 (*)    All other components within normal limits  I-STAT CG4 LACTIC ACID, ED - Abnormal; Notable for the following:    Lactic Acid, Venous 2.65 (*)    All other components within normal limits  LIPASE, BLOOD  MAGNESIUM  I-STAT CG4 LACTIC ACID, ED    EKG  EKG Interpretation None       Radiology Ct Abdomen Pelvis W Contrast  Result Date: 01/17/2016 CLINICAL DATA:  Nausea vomiting and diarrhea that started this morning. EXAM: CT ABDOMEN AND PELVIS WITH CONTRAST TECHNIQUE: Multidetector CT imaging of the abdomen and pelvis was performed using the standard protocol following bolus administration of intravenous contrast. CONTRAST:  142mL ISOVUE-300 IOPAMIDOL (ISOVUE-300) INJECTION 61% COMPARISON:  03/11/2011 FINDINGS: Lower chest:  Unremarkable. Hepatobiliary: 10 mm subcapsular hypodensity inferior right liver is unchanged. There is no evidence for gallstones, gallbladder wall thickening, or pericholecystic fluid. No intrahepatic or extrahepatic biliary dilation. Pancreas: No focal mass lesion. No dilatation of the main duct. No intraparenchymal cyst. No peripancreatic edema. Spleen: No splenomegaly. No focal mass lesion. Adrenals/Urinary Tract: No adrenal nodule or mass. Kidneys are unremarkable. No evidence for hydroureter. The urinary bladder appears normal for the degree of distention. Stomach/Bowel: Tiny hiatal hernia noted. Duodenum diverticulum evident. No small bowel wall thickening. No small bowel dilatation. The  terminal ileum is normal. The appendix is normal. No gross colonic mass. No colonic wall thickening. No substantial diverticular change. Vascular/Lymphatic: There is abdominal aortic atherosclerosis without aneurysm. There is no gastrohepatic or hepatoduodenal ligament lymphadenopathy. No intraperitoneal or retroperitoneal lymphadenopathy. No pelvic sidewall lymphadenopathy. Reproductive: Uterus surgically absent.  There is no adnexal mass. Other: No intraperitoneal free fluid. Musculoskeletal:  Bone windows reveal no worrisome lytic or sclerotic osseous lesions. IMPRESSION: 1. No acute findings in the abdomen or pelvis. Specifically, no features to explain the patient's history of nausea and vomiting with diarrhea. 2. Tiny hiatal hernia. Imaging findings of potential clinical significance: Abdominal Aortic Atherosclerois (ICD10-170.0) 1. Electronically Signed   By: Misty Stanley M.D.   On: 01/17/2016 12:10   Procedures Procedures (including critical care time)  Medications Ordered in ED Medications  sodium chloride 0.9 % bolus 1,000 mL (0 mLs Intravenous Stopped 01/17/16 1246)  HYDROmorphone (DILAUDID) injection 1 mg (1 mg Intravenous Given 01/17/16 0943)  promethazine (PHENERGAN) injection 25 mg (25 mg Intramuscular Given 01/17/16 0943)  HYDROmorphone (DILAUDID) injection 1 mg (1 mg Intravenous Given 01/17/16 1047)  LORazepam (ATIVAN) injection 0.5 mg (0.5 mg Intravenous Given 01/17/16 1047)  iopamidol (ISOVUE-300) 61 % injection 100 mL (100 mLs Intravenous Contrast Given 01/17/16 1140)  ketorolac (TORADOL) 15 MG/ML injection 15 mg (15 mg Intravenous Given 01/17/16 1256)  dicyclomine (BENTYL) capsule 10 mg (10 mg Oral Given 01/17/16 1312)     Initial Impression / Assessment and Plan / ED Course  I have reviewed the triage vital signs and the nursing notes.  Pertinent labs & imaging results that were available during my care of the patient were reviewed by me and considered in my medical decision  making (see chart for details).  Clinical Course  Comment By Time  Pt feeling significantly improved on recheck.  Abdominal exam soft and minimally tender. Quintella Reichert, MD 07/27 1302    Patient here with abdominal pain, nausea, vomiting, diarrhea. On initial examination patient with significant diffuse tenderness, on repeat assessment after IV fluid hydration, antiemetics, pain control her pain is significantly improved and she is able to tolerate oral fluids. There is no evidence of acute diverticulitis, pancreatitis. Presentation is not consistent with mesenteric ischemia. Patient with likely viral GI process with element of narcotic withdrawal given her inability to tolerate her oral pain medications at home. Given patient's significant improvement plan to DC home with close outpatient follow-up and return precautions.  Final Clinical Impressions(s) / ED Diagnoses   Final diagnoses:  Nausea vomiting and diarrhea    New Prescriptions Discharge Medication List as of 01/17/2016  2:19 PM    START taking these medications   Details  promethazine (PHENERGAN) 25 MG tablet Take 1 tablet (25 mg total) by mouth every 8 (eight) hours as needed for nausea or vomiting., Starting Thu 01/17/2016, Print         Quintella Reichert, MD 01/18/16 [id number]

## 2016-01-28 ENCOUNTER — Encounter: Payer: Self-pay | Admitting: Skilled Nursing Facility1

## 2016-01-28 ENCOUNTER — Encounter: Payer: Medicare Other | Attending: General Surgery | Admitting: Skilled Nursing Facility1

## 2016-01-28 ENCOUNTER — Other Ambulatory Visit: Payer: Self-pay | Admitting: General Surgery

## 2016-01-28 DIAGNOSIS — Z6841 Body Mass Index (BMI) 40.0 and over, adult: Secondary | ICD-10-CM | POA: Diagnosis not present

## 2016-01-28 DIAGNOSIS — E669 Obesity, unspecified: Secondary | ICD-10-CM

## 2016-01-28 DIAGNOSIS — E1142 Type 2 diabetes mellitus with diabetic polyneuropathy: Secondary | ICD-10-CM | POA: Diagnosis present

## 2016-01-28 DIAGNOSIS — I10 Essential (primary) hypertension: Secondary | ICD-10-CM | POA: Insufficient documentation

## 2016-01-28 NOTE — Progress Notes (Signed)
6 Months Supervised Weight Loss Visit:   Pre-Operative RYGB Surgery  Medical Nutrition Therapy:  Appt start time: 930 end time:  945  Primary concerns today: Supervised Weight Loss Visit #5. Pt returns having gained 3 pounds. Pt states she has lost wt because she has not been eating but then when she got on the scale she had actually gained wt. After states she has not been eating she then stated she has not been eating the right things. Pt then states she has only been eating grilled foods but then later including fried pork chops and chicken in her recall; there were a lot of inconstancies in her dietary recall as well as her feelings about her diet and the surgery. Pt states she is very worried about what she is going to eat after the surgery because she hates all of the protein shakes she has tried (chocolate premier protein, choclolate atkins, chocolate advance.), she hates fish, she loves fried foods, sodas, yogurt, and pasta. Pt was alluding to the inability to give these foods up on a long term basis. Pt states she had a low of 56 and drank 24 ounces of diet coke zero-Dietitian educated the pt on the hypoglycemic protocol. Pt states she finally looked at the carbohydrates for the protein shake she does like which has 25 grams of carbohydrate. Pt states she absolutely hates water and can only drink it if there is flavoring in it-Sugar free/calorie free flavorings were reviewed. Pt continually used the term "half eating: which was explained as only eating half of her portions she puts on her plate. With pts inconsistencies and double speak dietitian asked the pt to attempt to more clearly defined what she means by. "I have been sick, I have not been doing well, I am worried". Pt then explained she has bipolar disorder/depression and has been having a especially rough time and "Joe" only magnifies the issues. Dietitian asked who "Joe" was to which the pt replied "That is what I named the person "I see"  that always makes things worse for me (Joe is not real and is a delusion by the explanation of the pt). Dietitian asked the pt, " It does not seem like you want the surgery anymore" To which the pt paused and then replied "I just don't know" "I am not going to eat anything (foods she is used to being able to eat that would not be necessarily conducive to success after bariatric surgery). Pt states she has not been taking her thyroid pill since December.   Pt states she has not been conducting any physical activity.  Recall: Cheese Grilled meat, greens, baked beans  Protein shake Yellow yogurt Granola bars Chips and cheese popcorn  Weight: 210 lbs BMI: 39.8 Preferred Learning Style:   No preference indicated   Learning Readiness:   Ready  Medications: see list  Recent physical activity:  unknown  Progress Towards Goal(s):  In progress.  Handouts given during visit include:  none   Nutritional Diagnosis:  [place]-3.3 Obesity related to past poor dietary habits and physical inactivity as evidenced by patient attending supervised weight loss for insurance approval of bariatric surgery.    Intervention:  Nutrition counseling provided. Plan: -Try a protein powder in either water, unsweetened soy milk, unsweetened almond milk -Try to only see any fast food once a week -Add nuts to your yogurt and that is the only time to eat your nuts -Try the protein powder  Teaching Method Utilized:  Visual Auditory  Hands on  Barriers to learning/adherence to lifestyle change: unstructured meal schedule  Demonstrated degree of understanding via:  Teach Back   Monitoring/Evaluation:  Dietary intake, exercise, and body weight. Follow up in 1 months for 6 month supervised weight loss visit.

## 2016-02-06 NOTE — Progress Notes (Signed)
  Pre-Operative Nutrition Class:  Appt start time: 3887   End time:  1830.  Patient was seen on 02/04/2016 for Pre-Operative Bariatric Surgery Education at [facility].   Surgery date:  Surgery type: RYGB Start weight at [facility]: 219 lbs on 09/10/2015 Weight today: 207 lbs  TANITA  BODY COMP RESULTS  02/04/16   BMI (kg/m^2) 39.1   Fat Mass (lbs) 99.8   Fat Free Mass (lbs) 107.2   Total Body Water (lbs) 77.2   Samples given per MNT protocol. Patient educated on appropriate usage: Premier protein shake (vanilla - qty 1) Lot #: [id number] Exp: 08/2016  Bariatric Advantage Calcium Citrate chew (lemon - qty 1) Lot #: [id number] Exp: 11/2016  Bariatric Advantage Vitamins Multivitamin (mixed fruit - qty 1) Lot #: [id number] Exp: 12/2016  Renee Pain Protein Powder (chocolate - qty 1) Lot #: [id number] Exp: 03/2017  The following the learning objectives were met by the patient during this course:  Identify Pre-Op Dietary Goals and will begin 2 weeks pre-operatively  Identify appropriate sources of fluids and proteins   State protein recommendations and appropriate sources pre and post-operatively  Identify Post-Operative Dietary Goals and will follow for 2 weeks post-operatively  Identify appropriate multivitamin and calcium sources  Describe the need for physical activity post-operatively and will follow MD recommendations  State when to call healthcare provider regarding medication questions or post-operative complications  Handouts given during class include:  Pre-Op Bariatric Surgery Diet Handout  Protein Shake Handout  Post-Op Bariatric Surgery Nutrition Handout  [facility]EL[facility] Program Information Flyer  Support Group Information Flyer  [facility] Bariatric Supplements Price List  Follow-Up Plan: Patient will follow-up at [facility] 2 weeks post operatively for diet advancement per MD.

## 2016-02-07 ENCOUNTER — Ambulatory Visit: Payer: Self-pay | Admitting: Psychiatry

## 2016-02-08 ENCOUNTER — Ambulatory Visit (INDEPENDENT_AMBULATORY_CARE_PROVIDER_SITE_OTHER): Payer: Medicare Other | Admitting: Endocrinology

## 2016-02-08 ENCOUNTER — Encounter: Payer: Self-pay | Admitting: Endocrinology

## 2016-02-08 VITALS — BP 122/80 | HR 93 | Ht 61.0 in | Wt 208.0 lb

## 2016-02-08 DIAGNOSIS — Z794 Long term (current) use of insulin: Secondary | ICD-10-CM

## 2016-02-08 DIAGNOSIS — E1142 Type 2 diabetes mellitus with diabetic polyneuropathy: Secondary | ICD-10-CM

## 2016-02-08 LAB — POCT GLYCOSYLATED HEMOGLOBIN (HGB A1C): HEMOGLOBIN A1C: 9

## 2016-02-08 NOTE — Progress Notes (Signed)
Subjective:    Patient ID: [name], female    DOB: 1966/04/01, 50 y.o.   MRN: [id number]  HPI Pt returns for f/u of diabetes mellitus: DM type: Insulin-requiring type 2 Dx'ed: [id number] Complications: none.   Therapy: insulin since 2013 GDM: never DKA: never Severe hypoglycemia: never.  Pancreatitis: never.  Other: she takes QD insulin, due to forgetting more frequent doses.  Interval history:  She will have bariatric surgery in 10 days.  She has nausea.  She takes toujeo, 185 units qam, and metformin.  She has frequent mild hypoglycemia.   Past Medical History:  Diagnosis Date  . Abdominal pain   . Accidental drug overdose April 2013  . Anxiety   . Atrial fibrillation ([place]) 09/29/11   converted spontaneously  . Chronic back pain   . Chronic knee pain   . Chronic nausea   . Chronic pain   . COPD (chronic obstructive pulmonary disease) ([place])   . Depression   . Diabetes mellitus    states her doctor took her off all DM meds in past month  . Diabetic neuropathy ([place])   . GERD (gastroesophageal reflux disease)   . Headache(784.0)   . HTN (hypertension)   . Hyperlipidemia   . Hypothyroidism   . Mental disorder    Bipolar and schizophrenic  . Schizophrenia ([place])   . Tobacco abuse     Past Surgical History:  Procedure Laterality Date  . ABDOMINAL HYSTERECTOMY    . BLADDER SUSPENSION  03/04/2011   Procedure: [facility] PROCEDURE;  Surgeon: Elayne Snare MacDiarmid;  Location: [facility];  Service: Urology;  Laterality: N/A;  . CYSTOCELE REPAIR  03/04/2011   Procedure: ANTERIOR REPAIR (CYSTOCELE);  Surgeon: Reece Packer;  Location: [place] [facility];  Service: Urology;  Laterality: N/A;  . CYSTOSCOPY  03/04/2011   Procedure: CYSTOSCOPY;  Surgeon: Elayne Snare MacDiarmid;  Location: [place] [facility] [facility];  Service: Urology;  Laterality: N/A;  . KNEE SURGERY    . LAPAROSCOPIC ASSISTED VAGINAL HYSTERECTOMY  03/04/2011   Procedure: LAPAROSCOPIC ASSISTED VAGINAL HYSTERECTOMY;  Surgeon: Cyril Mourning, MD;   Location: [place] [facility];  Service: Gynecology;  Laterality: N/A;    Social History   Social History  . Marital status: Legally Separated    Spouse name: N/A  . Number of children: N/A  . Years of education: N/A   Occupational History  . Not on file.   Social History Main Topics  . Smoking status: Former Smoker    Quit date: 07/24/2013  . Smokeless tobacco: Never Used  . Alcohol use No  . Drug use: No  . Sexual activity: Yes    Partners: Male   Other Topics Concern  . Not on file   Social History Narrative  . No narrative on file    Current Outpatient Prescriptions on File Prior to Visit  Medication Sig Dispense Refill  . clonazePAM (KLONOPIN) 0.5 MG tablet Take 0.5 mg by mouth 2 (two) times daily as needed. For anxiety    . diclofenac (VOLTAREN) 50 MG EC tablet Take 1 tablet (50 mg total) by mouth 3 (three) times daily as needed for moderate pain. 30 tablet 0  . DULoxetine (CYMBALTA) 60 MG capsule Take 60 mg by mouth at bedtime.    . furosemide (LASIX) 20 MG tablet Take 1 tablet (20 mg total) by mouth daily. 10 tablet 0  . hydrOXYzine (ATARAX/VISTARIL) 50 MG tablet Take 50 mg by mouth 3 (three) times daily as needed.    Marland Kitchen ibuprofen (ADVIL,MOTRIN)  800 MG tablet Take 1 tablet (800 mg total) by mouth 3 (three) times daily. 21 tablet 0  . LATUDA 80 MG TABS tablet Take 80 mg by mouth daily.    Marland Kitchen levothyroxine (SYNTHROID, LEVOTHROID) 50 MCG tablet Take 50 mcg by mouth daily.  5  . lidocaine (LIDODERM) 5 % Place 1 patch onto the skin daily. Remove & Discard patch within 12 hours or as directed by MD 30 patch 0  . LYRICA 75 MG capsule Take 75 mg by mouth 2 (two) times daily.  2  . OPANA ER, CRUSH RESISTANT, 40 MG T12A Take 40 mg by mouth every 8 (eight) hours.    Marland Kitchen oxyCODONE-acetaminophen (PERCOCET) 10-325 MG per tablet Take 1 tablet by mouth every 4 (four) hours as needed for pain. 15 tablet 0  . pantoprazole (PROTONIX) 40 MG tablet Take 40 mg by mouth daily.    Marland Kitchen PROAIR HFA 108 (90  Base) MCG/ACT inhaler Inhale 2 puffs into the lungs every 6 (six) hours as needed for wheezing or shortness of breath.   5  . promethazine (PHENERGAN) 25 MG tablet Take 1 tablet (25 mg total) by mouth every 8 (eight) hours as needed for nausea or vomiting. 10 tablet 0  . RESTASIS 0.05 % ophthalmic emulsion PLACE 1 DROP IN EACH EYE TWICE A DAY  3  . simvastatin (ZOCOR) 20 MG tablet Take 20 mg by mouth daily.  5  . SUMAtriptan 6 MG/0.5ML SOAJ Inject 6 mg into the muscle daily as needed. For headaches.  5  . topiramate (TOPAMAX) 50 MG tablet Take 150-200 mg by mouth at bedtime. Reported on 07/04/2015  12  . TOUJEO SOLOSTAR 300 UNIT/ML SOPN Inject 180 Units into the skin daily.     . traZODone (DESYREL) 100 MG tablet Take 200 mg by mouth at bedtime.     No current facility-administered medications on file prior to visit.     Allergies  Allergen Reactions  . Aspirin Other (See Comments)    Ulcer; Does fine with ibuprofen & Tylenol    Family History  Problem Relation Age of Onset  . Heart attack Father     50s  . Diabetes Mother   . Heart disease Mother   . Hypertension Mother   . Heart attack Sister     28  . COPD Other     BP 122/80   Pulse 93   Ht 5\' 1"  (1.549 m)   Wt 208 lb (94.3 kg)   LMP 01/08/2011   SpO2 96%   BMI 39.30 kg/m   Review of Systems Denies LOC.     Objective:   Physical Exam VITAL SIGNS:  See vs page GENERAL: no distress Pulses: dorsalis pedis intact bilat.   MSK: no deformity of the feet.  CV: 1+ bilat leg edema.  Skin:  no ulcer on the feet.  normal color and temp on the feet.  Neuro: sensation is intact to touch on the feet.    A1c=9.0%    Assessment & Plan:  Nausea, caused or exac by metformin.  Insulin-requiring type 2 DM: worse Obesity: persistent.  She will have surgery soon.

## 2016-02-08 NOTE — Patient Instructions (Addendum)
check your blood sugar twice a day.  vary the time of day when you check, between before the 3 meals, and at bedtime.  also check if you have symptoms of your blood sugar being too high or too low.  please keep a record of the readings and bring it to your next appointment here.  You can write it on any piece of paper.  please call us sooner if your blood sugar goes below 70, or if you have a lot of readings over 200. Please continue the same toujeo, and stop taking the metformin.   On this type of insulin schedule, you should eat meals on a regular schedule.  If a meal is missed or significantly delayed, your blood sugar could go low.  Please come back for a follow-up appointment in 3 weeks.

## 2016-02-11 ENCOUNTER — Ambulatory Visit (INDEPENDENT_AMBULATORY_CARE_PROVIDER_SITE_OTHER): Payer: 59 | Admitting: Licensed Clinical Social Worker

## 2016-02-13 ENCOUNTER — Ambulatory Visit (INDEPENDENT_AMBULATORY_CARE_PROVIDER_SITE_OTHER): Payer: 59 | Admitting: Licensed Clinical Social Worker

## 2016-02-14 ENCOUNTER — Inpatient Hospital Stay (HOSPITAL_COMMUNITY)
Admission: RE | Admit: 2016-02-14 | Discharge: 2016-02-14 | Disposition: A | Discharge status: Home / Self Care | Payer: Self-pay | Source: Ambulatory Visit

## 2016-02-18 ENCOUNTER — Inpatient Hospital Stay (HOSPITAL_COMMUNITY): Admission: RE | Admit: 2016-02-18 | Payer: Medicare Other | Source: Ambulatory Visit | Admitting: General Surgery

## 2016-02-18 ENCOUNTER — Encounter (HOSPITAL_COMMUNITY): Admission: RE | Payer: Self-pay | Source: Ambulatory Visit

## 2016-02-18 SURGERY — LAPAROSCOPIC ROUX-EN-Y GASTRIC BYPASS WITH UPPER ENDOSCOPY
Anesthesia: General

## 2016-02-25 ENCOUNTER — Emergency Department (HOSPITAL_COMMUNITY): Payer: No Typology Code available for payment source

## 2016-02-25 ENCOUNTER — Encounter (HOSPITAL_COMMUNITY): Payer: Self-pay | Admitting: Emergency Medicine

## 2016-02-25 ENCOUNTER — Emergency Department (HOSPITAL_COMMUNITY)
Admission: EM | Admit: 2016-02-25 | Discharge: 2016-02-25 | Disposition: A | Discharge status: Home / Self Care | Payer: No Typology Code available for payment source | Attending: Emergency Medicine | Admitting: Emergency Medicine

## 2016-02-25 DIAGNOSIS — Z87891 Personal history of nicotine dependence: Secondary | ICD-10-CM | POA: Insufficient documentation

## 2016-02-25 DIAGNOSIS — E039 Hypothyroidism, unspecified: Secondary | ICD-10-CM | POA: Diagnosis not present

## 2016-02-25 DIAGNOSIS — Y9389 Activity, other specified: Secondary | ICD-10-CM | POA: Diagnosis not present

## 2016-02-25 DIAGNOSIS — Y92410 Unspecified street and highway as the place of occurrence of the external cause: Secondary | ICD-10-CM | POA: Diagnosis not present

## 2016-02-25 DIAGNOSIS — Y999 Unspecified external cause status: Secondary | ICD-10-CM | POA: Diagnosis not present

## 2016-02-25 DIAGNOSIS — M79602 Pain in left arm: Secondary | ICD-10-CM | POA: Insufficient documentation

## 2016-02-25 DIAGNOSIS — I10 Essential (primary) hypertension: Secondary | ICD-10-CM | POA: Diagnosis not present

## 2016-02-25 DIAGNOSIS — J449 Chronic obstructive pulmonary disease, unspecified: Secondary | ICD-10-CM | POA: Diagnosis not present

## 2016-02-25 DIAGNOSIS — Z79899 Other long term (current) drug therapy: Secondary | ICD-10-CM | POA: Insufficient documentation

## 2016-02-25 DIAGNOSIS — E119 Type 2 diabetes mellitus without complications: Secondary | ICD-10-CM | POA: Diagnosis not present

## 2016-02-25 MED ORDER — NAPROXEN 500 MG PO TABS: 500.0000 mg | ORAL_TABLET | Freq: Two times a day (BID) | ORAL | 0 refills | Status: DC
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
## 2016-02-25 NOTE — ED Notes (Signed)
Pt's chart accessed to clarify d/c paperwork. (9/4 1125)

## 2016-02-26 ENCOUNTER — Emergency Department (HOSPITAL_COMMUNITY)
Admission: EM | Admit: 2016-02-26 | Discharge: 2016-02-26 | Disposition: A | Discharge status: Home / Self Care | Payer: Medicare Other | Attending: Emergency Medicine | Admitting: Emergency Medicine

## 2016-02-26 ENCOUNTER — Other Ambulatory Visit: Payer: Self-pay

## 2016-02-26 ENCOUNTER — Encounter (HOSPITAL_COMMUNITY): Payer: Self-pay

## 2016-02-26 ENCOUNTER — Ambulatory Visit: Payer: Self-pay | Admitting: Dietician

## 2016-02-26 DIAGNOSIS — I10 Essential (primary) hypertension: Secondary | ICD-10-CM | POA: Insufficient documentation

## 2016-02-26 DIAGNOSIS — Y9389 Activity, other specified: Secondary | ICD-10-CM | POA: Insufficient documentation

## 2016-02-26 DIAGNOSIS — E039 Hypothyroidism, unspecified: Secondary | ICD-10-CM | POA: Insufficient documentation

## 2016-02-26 DIAGNOSIS — Y939 Activity, unspecified: Secondary | ICD-10-CM

## 2016-02-26 DIAGNOSIS — M545 Low back pain: Secondary | ICD-10-CM | POA: Insufficient documentation

## 2016-02-26 DIAGNOSIS — Y92410 Unspecified street and highway as the place of occurrence of the external cause: Secondary | ICD-10-CM

## 2016-02-26 DIAGNOSIS — Z87891 Personal history of nicotine dependence: Secondary | ICD-10-CM | POA: Insufficient documentation

## 2016-02-26 DIAGNOSIS — M542 Cervicalgia: Secondary | ICD-10-CM | POA: Insufficient documentation

## 2016-02-26 DIAGNOSIS — Y999 Unspecified external cause status: Secondary | ICD-10-CM | POA: Insufficient documentation

## 2016-02-26 DIAGNOSIS — E119 Type 2 diabetes mellitus without complications: Secondary | ICD-10-CM | POA: Diagnosis not present

## 2016-02-26 DIAGNOSIS — Z791 Long term (current) use of non-steroidal anti-inflammatories (NSAID): Secondary | ICD-10-CM | POA: Diagnosis not present

## 2016-02-26 DIAGNOSIS — M25512 Pain in left shoulder: Secondary | ICD-10-CM | POA: Diagnosis not present

## 2016-02-26 DIAGNOSIS — G8929 Other chronic pain: Secondary | ICD-10-CM

## 2016-02-26 DIAGNOSIS — Z048 Encounter for examination and observation for other specified reasons: Secondary | ICD-10-CM | POA: Diagnosis not present

## 2016-02-26 DIAGNOSIS — Z79899 Other long term (current) drug therapy: Secondary | ICD-10-CM | POA: Diagnosis not present

## 2016-02-26 DIAGNOSIS — Z5321 Procedure and treatment not carried out due to patient leaving prior to being seen by health care provider: Secondary | ICD-10-CM

## 2016-02-26 DIAGNOSIS — E1140 Type 2 diabetes mellitus with diabetic neuropathy, unspecified: Secondary | ICD-10-CM | POA: Insufficient documentation

## 2016-02-26 DIAGNOSIS — J449 Chronic obstructive pulmonary disease, unspecified: Secondary | ICD-10-CM | POA: Insufficient documentation

## 2016-02-26 MED ORDER — OMEPRAZOLE 20 MG PO CPDR: 20.0000 mg | DELAYED_RELEASE_CAPSULE | Freq: Every day | ORAL | 3 refills | Status: DC
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
## 2016-02-26 NOTE — Discharge Instructions (Signed)
Please follow up with your family doctor. Take tylenol for pain. Try heating pads.
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
## 2016-02-27 ENCOUNTER — Encounter (HOSPITAL_COMMUNITY): Payer: Self-pay | Admitting: Emergency Medicine

## 2016-02-27 ENCOUNTER — Emergency Department (HOSPITAL_COMMUNITY)
Admission: EM | Admit: 2016-02-27 | Discharge: 2016-02-27 | Disposition: A | Discharge status: Home / Self Care | Payer: Medicare Other | Source: Home / Self Care

## 2016-02-27 NOTE — ED Notes (Signed)
Unable to locate pt. at waiting area several times by staff.  
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
## 2016-02-28 ENCOUNTER — Telehealth: Payer: Self-pay | Admitting: Endocrinology

## 2016-02-28 NOTE — Telephone Encounter (Signed)
[facility] called to request Omeprazole refill.

## 2016-02-28 NOTE — Telephone Encounter (Signed)
[facility] has been notified to contact patient's PCP for this request.

## 2016-02-29 ENCOUNTER — Encounter: Payer: Medicare Other | Attending: General Surgery | Admitting: Skilled Nursing Facility1

## 2016-02-29 ENCOUNTER — Encounter: Payer: Self-pay | Admitting: Skilled Nursing Facility1

## 2016-02-29 DIAGNOSIS — I10 Essential (primary) hypertension: Secondary | ICD-10-CM | POA: Insufficient documentation

## 2016-02-29 DIAGNOSIS — Z6841 Body Mass Index (BMI) 40.0 and over, adult: Secondary | ICD-10-CM | POA: Diagnosis not present

## 2016-02-29 DIAGNOSIS — E1142 Type 2 diabetes mellitus with diabetic polyneuropathy: Secondary | ICD-10-CM | POA: Insufficient documentation

## 2016-02-29 DIAGNOSIS — E669 Obesity, unspecified: Secondary | ICD-10-CM

## 2016-02-29 NOTE — Patient Instructions (Addendum)
You can eat these: You can cook these with a little drizzling of olive oil Carrots Broccoli Cauliflower  Asparagus Brussels Spouts Tomatoes Onion Cucumber Any kind of green (mustard, spinach, boston, kale, romaine, cabbage, etc.) Beets Radishes Celery    -Choose fat free milk -Choose low fat cheese -Do not eat whip cream -Do not add hershey chocolate to your milk -Use non-stick spray  -Do not use butter or margarine

## 2016-02-29 NOTE — Progress Notes (Signed)
  Pre-Operative Nutrition Class:  Appt start time: 10:30  End time:  11:30.  Patient was seen on 02/29/2016 for Pre-Operative Bariatric Surgery Education at [facility].   Pt arrived lively and jovial. Pt states she went to the [facility] and they told her they could not tell her which vitamins to take. Pt states she has 3 cousins and a best friend and another friends who have had the surgery so she knows every thing like knowing she cannot drink a whole bottle of liqueur (Pt states she does not drink that will not be an issue). Pt states she wants to use the information from the dietitian not her friends. Pt states she has found conflicted nutrition information given to her by [facility] (referreing to the frozen meals which she had substantial issue in understanding). Pt states she only likes greens and cabbage as far as non starchy vegetables are concerned. Pt states she does not eat any seafood but she might try salmon.  Dietitian Went through several examples of how to read the nutrition facts label for frozen meal options: after which she stated she felt clearer on the frozen meal recommendations.  Pt wanders if she Can eat whip cream. Pt states she is drinking the protein shakes. Pt sates she fully understands the chewing and fluid recommendations. Pt states she tries to do water aerobics 3 times a week for 60 minutes.   Pt cannot get more information than what is needed in the moment. Dietitian focused on the preoperative diet but used the post operative vitamin recommendations as she had tremendous trouble understanding the difference.  Pt states she is absolutely ready for surgery and understands what she needs to do.  Dietitian explained to the pt she will come back to [facility] after surgery for post operative recomendations in a one on one environment to which the pt replied "great!"(with excitement).  Surgery date:  Surgery type: RYGB Start weight at [facility]:  219 lbs on 09/10/2015 Weight today: 207 lbs  TANITA  BODY COMP RESULTS 02/29/2016  02/04/16  39.1  BMI (kg/m^2) 39.1  99.01  Fat Mass (lbs) 99.8  108.01  Fat Free Mass (lbs) 107.2  77.8  Total Body Water (lbs) 77.2    The following the learning objectives were met by the patient during this course:  Identify Pre-Op Diet and will begin now   Identify appropriate sources of fluids and proteins   State protein recommendations and appropriate sources pre operatively  Identify appropriate multivitamin and calcium sources  Describe the need for physical activity   State when to call healthcare provider regarding medication questions or post-operative complications  Handouts given during class include:  Pre-Op Bariatric Surgery Diet Handout  Protein Shake Handout  Post-Op Bariatric Surgery Nutrition Handout  [facility] Program Information Flyer  Support Group Information [facility] Bariatric Supplements Price List

## 2016-03-03 ENCOUNTER — Ambulatory Visit: Payer: Self-pay | Admitting: Endocrinology

## 2016-03-03 DIAGNOSIS — Z0289 Encounter for other administrative examinations: Secondary | ICD-10-CM

## 2016-03-12 ENCOUNTER — Ambulatory Visit: Payer: Self-pay | Admitting: General Surgery

## 2016-03-13 ENCOUNTER — Telehealth: Payer: Self-pay | Admitting: Endocrinology

## 2016-03-13 NOTE — Telephone Encounter (Signed)
See message and please advise, Thanks!  

## 2016-03-13 NOTE — Telephone Encounter (Signed)
I contacted the patient and advised of message via voicemail. Requested a call back from the patient to verify how her sugars have been running.

## 2016-03-13 NOTE — Telephone Encounter (Signed)
Question first; How are cbg's doing?

## 2016-03-13 NOTE — Telephone Encounter (Signed)
Pt called in and said that she didn't know whether or not she needed to see Dr. Loanne Drilling before or after her upcoming surgery.  She is scheduled on 03/25/16 to have her procedure and was confused as to when Dr. Loanne Drilling wanted to see her again.

## 2016-03-14 MED FILL — oxyCODONE HCL 5 MG/5ML SOLN: 5 | 4 days supply | Qty: 200 | Fill #0

## 2016-03-18 NOTE — Patient Instructions (Addendum)
[name]  03/18/2016   Your procedure is scheduled on: 03/25/2016    Report to [facility] Main  Entrance take [place]  elevators to 3rd floor to  [place] at   [place] AM.  Call this number if you have problems the morning of surgery [phone]   Remember: ONLY 1 PERSON MAY GO WITH YOU TO SHORT STAY TO GET  READY MORNING OF YOUR SURGERY.  Do not eat food or drink liquids :After Midnight.     Take these medicines the morning of surgery with A SIP OF WATER: proair Inhaler if needed and bring, Clonazepam if needed, Eye drops Cardizem CD, Synthroid, Prilosec  , Lyrica , opana  DO NOT TAKE ANY DIABETIC MEDICATIONS DAY OF YOUR SURGERY                               You may not have any metal on your body including hair pins and              piercings  Do not wear jewelry, make-up, lotions, powders or perfumes, deodorant             Do not wear nail polish.  Do not shave  48 hours prior to surgery.                Do not bring valuables to the hospital. [place].  Contacts, dentures or bridgework may not be worn into surgery.  Leave suitcase in the car. After surgery it may be brought to your room.       Special Instructions: N/A              Please read over the following fact sheets you were given: _____________________________________________________________________             [facility] - Preparing for Surgery Before surgery, you can play an important role.  Because skin is not sterile, your skin needs to be as free of germs as possible.  You can reduce the number of germs on your skin by washing with CHG (chlorahexidine gluconate) soap before surgery.  CHG is an antiseptic cleaner which kills germs and bonds with the skin to continue killing germs even after washing. Please DO NOT use if you have an allergy to CHG or antibacterial soaps.  If your skin becomes reddened/irritated stop using the CHG  and inform your nurse when you arrive at Short Stay. Do not shave (including legs and underarms) for at least 48 hours prior to the first CHG shower.  You may shave your face/neck. Please follow these instructions carefully:  1.  Shower with CHG Soap the night before surgery and the  morning of Surgery.  2.  If you choose to wash your hair, wash your hair first as usual with your  normal  shampoo.  3.  After you shampoo, rinse your hair and body thoroughly to remove the  shampoo.                           4.  Use CHG as you would any other liquid soap.  You can apply chg directly  to the skin and wash  Gently with a scrungie or clean washcloth.  5.  Apply the CHG Soap to your body ONLY FROM THE NECK DOWN.   Do not use on face/ open                           Wound or open sores. Avoid contact with eyes, ears mouth and genitals (private parts).                       Wash face,  Genitals (private parts) with your normal soap.             6.  Wash thoroughly, paying special attention to the area where your surgery  will be performed.  7.  Thoroughly rinse your body with warm water from the neck down.  8.  DO NOT shower/wash with your normal soap after using and rinsing off  the CHG Soap.                9.  Pat yourself dry with a clean towel.            10.  Wear clean pajamas.            11.  Place clean sheets on your bed the night of your first shower and do not  sleep with pets. Day of Surgery : Do not apply any lotions/deodorants the morning of surgery.  Please wear clean clothes to the hospital/surgery center.  FAILURE TO FOLLOW THESE INSTRUCTIONS MAY RESULT IN THE CANCELLATION OF YOUR SURGERY PATIENT SIGNATURE_________________________________  NURSE SIGNATURE__________________________________  ________________________________________________________________________

## 2016-03-19 ENCOUNTER — Encounter (HOSPITAL_COMMUNITY)
Admission: RE | Admit: 2016-03-19 | Discharge: 2016-03-19 | Disposition: A | Discharge status: Home / Self Care | Payer: Medicare Other | Source: Ambulatory Visit | Attending: General Surgery | Admitting: General Surgery

## 2016-03-19 ENCOUNTER — Encounter (HOSPITAL_COMMUNITY): Payer: Self-pay

## 2016-03-19 DIAGNOSIS — Z01812 Encounter for preprocedural laboratory examination: Secondary | ICD-10-CM | POA: Diagnosis present

## 2016-03-19 DIAGNOSIS — Z6839 Body mass index (BMI) 39.0-39.9, adult: Secondary | ICD-10-CM | POA: Insufficient documentation

## 2016-03-19 HISTORY — DX: Dependence on supplemental oxygen: Z99.81

## 2016-03-19 LAB — COMPREHENSIVE METABOLIC PANEL
ALT: 21 U/L (ref 14–54)
AST: 17 U/L (ref 15–41)
Albumin: 4.2 g/dL (ref 3.5–5.0)
Alkaline Phosphatase: 80 U/L (ref 38–126)
Anion gap: 6 (ref 5–15)
BILIRUBIN TOTAL: 0.9 mg/dL (ref 0.3–1.2)
BUN: 19 mg/dL (ref 6–20)
CO2: 28 mmol/L (ref 22–32)
CREATININE: 0.71 mg/dL (ref 0.44–1.00)
Calcium: 9.6 mg/dL (ref 8.9–10.3)
Chloride: 107 mmol/L (ref 101–111)
GFR calc Af Amer: >60 mL/min (ref >60)
Glucose, Bld: 113 mg/dL — ABNORMAL HIGH (ref 65–99)
POTASSIUM: 4.6 mmol/L (ref 3.5–5.1)
Sodium: 141 mmol/L (ref 135–145)
TOTAL PROTEIN: 7.5 g/dL (ref 6.5–8.1)

## 2016-03-19 LAB — CBC WITH DIFFERENTIAL/PLATELET
BASOS ABS: 0 10*3/uL (ref 0.0–0.1)
Basophils Relative: 0 %
Eosinophils Absolute: 0.1 10*3/uL (ref 0.0–0.7)
Eosinophils Relative: 1 %
HEMATOCRIT: 39.5 % (ref 36.0–46.0)
Hemoglobin: 12.8 g/dL (ref 12.0–15.0)
LYMPHS ABS: 2.9 10*3/uL (ref 0.7–4.0)
LYMPHS PCT: 42 %
MCH: 27.4 pg (ref 26.0–34.0)
MCHC: 32.4 g/dL (ref 30.0–36.0)
MCV: 84.6 fL (ref 78.0–100.0)
MONO ABS: 0.3 10*3/uL (ref 0.1–1.0)
MONOS PCT: 4 %
NEUTROS ABS: 3.7 10*3/uL (ref 1.7–7.7)
Neutrophils Relative %: 53 %
Platelets: 240 10*3/uL (ref 150–400)
RBC: 4.67 MIL/uL (ref 3.87–5.11)
RDW: 14.8 % (ref 11.5–15.5)
WBC: 7 10*3/uL (ref 4.0–10.5)

## 2016-03-19 NOTE — Progress Notes (Signed)
HGBA1C done 02/08/2016- routed via EPIC to Dr Excell Seltzer.

## 2016-03-19 NOTE — Progress Notes (Signed)
EKG-10/16/2015- epic  HGBA1C- 9.0-8/18/2017- EPIC

## 2016-03-21 ENCOUNTER — Ambulatory Visit: Payer: Self-pay | Admitting: General Surgery

## 2016-03-25 ENCOUNTER — Inpatient Hospital Stay (HOSPITAL_COMMUNITY): Payer: Medicare Other | Admitting: Certified Registered"

## 2016-03-25 ENCOUNTER — Inpatient Hospital Stay (HOSPITAL_COMMUNITY)
Admission: RE | Admit: 2016-03-25 | Discharge: 2016-03-27 | DRG: 621 | Disposition: A | Discharge status: Home Health | Payer: Medicare Other | Source: Ambulatory Visit | Attending: General Surgery | Admitting: General Surgery

## 2016-03-25 ENCOUNTER — Encounter (HOSPITAL_COMMUNITY)
Admission: RE | Disposition: A | Discharge status: Home Health | Payer: Self-pay | Source: Ambulatory Visit | Attending: General Surgery

## 2016-03-25 ENCOUNTER — Encounter (HOSPITAL_COMMUNITY): Payer: Self-pay

## 2016-03-25 DIAGNOSIS — K219 Gastro-esophageal reflux disease without esophagitis: Secondary | ICD-10-CM | POA: Diagnosis present

## 2016-03-25 DIAGNOSIS — Z87891 Personal history of nicotine dependence: Secondary | ICD-10-CM | POA: Diagnosis not present

## 2016-03-25 DIAGNOSIS — Z7952 Long term (current) use of systemic steroids: Secondary | ICD-10-CM | POA: Diagnosis not present

## 2016-03-25 DIAGNOSIS — E1142 Type 2 diabetes mellitus with diabetic polyneuropathy: Secondary | ICD-10-CM | POA: Diagnosis present

## 2016-03-25 DIAGNOSIS — L298 Other pruritus: Secondary | ICD-10-CM | POA: Diagnosis not present

## 2016-03-25 DIAGNOSIS — J449 Chronic obstructive pulmonary disease, unspecified: Secondary | ICD-10-CM | POA: Diagnosis present

## 2016-03-25 DIAGNOSIS — F209 Schizophrenia, unspecified: Secondary | ICD-10-CM | POA: Diagnosis present

## 2016-03-25 DIAGNOSIS — Z9981 Dependence on supplemental oxygen: Secondary | ICD-10-CM | POA: Diagnosis not present

## 2016-03-25 DIAGNOSIS — Z818 Family history of other mental and behavioral disorders: Secondary | ICD-10-CM

## 2016-03-25 DIAGNOSIS — K449 Diaphragmatic hernia without obstruction or gangrene: Secondary | ICD-10-CM | POA: Diagnosis present

## 2016-03-25 DIAGNOSIS — Z79899 Other long term (current) drug therapy: Secondary | ICD-10-CM

## 2016-03-25 DIAGNOSIS — Z90721 Acquired absence of ovaries, unilateral: Secondary | ICD-10-CM

## 2016-03-25 DIAGNOSIS — R0689 Other abnormalities of breathing: Secondary | ICD-10-CM | POA: Diagnosis present

## 2016-03-25 DIAGNOSIS — Z6839 Body mass index (BMI) 39.0-39.9, adult: Secondary | ICD-10-CM

## 2016-03-25 DIAGNOSIS — Z8249 Family history of ischemic heart disease and other diseases of the circulatory system: Secondary | ICD-10-CM

## 2016-03-25 DIAGNOSIS — F418 Other specified anxiety disorders: Secondary | ICD-10-CM | POA: Diagnosis present

## 2016-03-25 DIAGNOSIS — Z7984 Long term (current) use of oral hypoglycemic drugs: Secondary | ICD-10-CM | POA: Diagnosis not present

## 2016-03-25 DIAGNOSIS — Z9884 Bariatric surgery status: Secondary | ICD-10-CM

## 2016-03-25 HISTORY — PX: GASTRIC ROUX-EN-Y: SHX5262

## 2016-03-25 LAB — GLUCOSE, CAPILLARY
GLUCOSE-CAPILLARY: 285 mg/dL — AB (ref 65–99)
Glucose-Capillary: 204 mg/dL — ABNORMAL HIGH (ref 65–99)
Glucose-Capillary: 175 mg/dL — ABNORMAL HIGH (ref 65–99)
Glucose-Capillary: 145 mg/dL — ABNORMAL HIGH (ref 65–99)

## 2016-03-25 LAB — HEMOGLOBIN AND HEMATOCRIT, BLOOD
HCT: 40.5 % (ref 36.0–46.0)
Hemoglobin: 13.4 g/dL (ref 12.0–15.0)

## 2016-03-25 SURGERY — LAPAROSCOPIC ROUX-EN-Y GASTRIC BYPASS WITH UPPER ENDOSCOPY
Anesthesia: General

## 2016-03-25 MED ORDER — ONDANSETRON HCL 4 MG/2ML IJ SOLN
INTRAMUSCULAR | Status: DC | PRN
  Administered 2016-03-25: 4 mg via INTRAVENOUS

## 2016-03-25 MED ORDER — SUGAMMADEX SODIUM 500 MG/5ML IV SOLN
INTRAVENOUS | Status: DC | PRN
  Administered 2016-03-25: 400 mg via INTRAVENOUS

## 2016-03-25 MED ORDER — CEFOTETAN DISODIUM-DEXTROSE 2-2.08 GM-% IV SOLR
INTRAVENOUS | Status: AC
  Filled 2016-03-25: qty 50

## 2016-03-25 MED ORDER — DEXAMETHASONE SODIUM PHOSPHATE 10 MG/ML IJ SOLN
INTRAMUSCULAR | Status: AC
  Filled 2016-03-25: qty 1

## 2016-03-25 MED ORDER — PROPOFOL 10 MG/ML IV BOLUS
INTRAVENOUS | Status: DC | PRN
  Administered 2016-03-25: 160 mg via INTRAVENOUS

## 2016-03-25 MED ORDER — MIDAZOLAM HCL 5 MG/5ML IJ SOLN
INTRAMUSCULAR | Status: DC | PRN
Start: 2016-03-25 — End: 2016-03-25
  Administered 2016-03-25: 2 mg via INTRAVENOUS

## 2016-03-25 MED ORDER — POTASSIUM CHLORIDE IN NACL 20-0.9 MEQ/L-% IV SOLN
INTRAVENOUS | Status: DC
  Administered 2016-03-25: 14:00:00 via INTRAVENOUS
  Administered 2016-03-25 – 2016-03-26 (×2): 100 mL/h via INTRAVENOUS
  Administered 2016-03-27: 10:00:00 via INTRAVENOUS
  Filled 2016-03-25 (×6): qty 1000

## 2016-03-25 MED ORDER — BUPIVACAINE-EPINEPHRINE 0.5% -1:200000 IJ SOLN
INTRAMUSCULAR | Status: AC
  Filled 2016-03-25: qty 1

## 2016-03-25 MED ORDER — INSULIN GLARGINE 100 UNIT/ML ~~LOC~~ SOLN
40.0000 [IU] | Freq: Every day | SUBCUTANEOUS | Status: DC
  Administered 2016-03-25 – 2016-03-26 (×2): 40 [IU] via SUBCUTANEOUS
  Filled 2016-03-25 (×3): qty 0.4

## 2016-03-25 MED ORDER — BUPIVACAINE LIPOSOME 1.3 % IJ SUSP
20.0000 mL | Freq: Once | INTRAMUSCULAR | Status: AC
  Administered 2016-03-25: 20 mL
  Filled 2016-03-25: qty 20

## 2016-03-25 MED ORDER — FENTANYL CITRATE (PF) 250 MCG/5ML IJ SOLN
INTRAMUSCULAR | Status: DC | PRN
  Administered 2016-03-25 (×4): 50 ug via INTRAVENOUS

## 2016-03-25 MED ORDER — KETAMINE HCL 10 MG/ML IJ SOLN
INTRAMUSCULAR | Status: AC
  Filled 2016-03-25: qty 1

## 2016-03-25 MED ORDER — PHENYLEPHRINE 40 MCG/ML (10ML) SYRINGE FOR IV PUSH (FOR BLOOD PRESSURE SUPPORT)
PREFILLED_SYRINGE | INTRAVENOUS | Status: DC | PRN
  Administered 2016-03-25 (×7): 80 ug via INTRAVENOUS

## 2016-03-25 MED ORDER — OXYCODONE HCL 5 MG/5ML PO SOLN
5.0000 mg | ORAL | Status: DC | PRN
  Administered 2016-03-27 (×2): 10 mg via ORAL
  Administered 2016-03-27: 5 mg via ORAL
  Administered 2016-03-27 (×2): 10 mg via ORAL
  Administered 2016-03-27: 5 mg via ORAL
  Filled 2016-03-25: qty 5
  Filled 2016-03-25 (×4): qty 10
  Filled 2016-03-25: qty 5

## 2016-03-25 MED ORDER — FENTANYL CITRATE (PF) 100 MCG/2ML IJ SOLN
25.0000 ug | INTRAMUSCULAR | Status: DC | PRN
  Administered 2016-03-25 (×3): 50 ug via INTRAVENOUS

## 2016-03-25 MED ORDER — FAMOTIDINE IN NACL 20-0.9 MG/50ML-% IV SOLN
20.0000 mg | Freq: Two times a day (BID) | INTRAVENOUS | Status: DC
  Administered 2016-03-25 – 2016-03-27 (×4): 20 mg via INTRAVENOUS
  Filled 2016-03-25 (×4): qty 50

## 2016-03-25 MED ORDER — CEFOTETAN DISODIUM-DEXTROSE 2-2.08 GM-% IV SOLR
2.0000 g | INTRAVENOUS | Status: AC
  Administered 2016-03-25: 2 g via INTRAVENOUS

## 2016-03-25 MED ORDER — PHENYLEPHRINE 40 MCG/ML (10ML) SYRINGE FOR IV PUSH (FOR BLOOD PRESSURE SUPPORT)
PREFILLED_SYRINGE | INTRAVENOUS | Status: AC
  Filled 2016-03-25: qty 10

## 2016-03-25 MED ORDER — ROCURONIUM BROMIDE 10 MG/ML (PF) SYRINGE
PREFILLED_SYRINGE | INTRAVENOUS | Status: AC
  Filled 2016-03-25: qty 10

## 2016-03-25 MED ORDER — KETAMINE HCL 10 MG/ML IJ SOLN
INTRAMUSCULAR | Status: DC | PRN
  Administered 2016-03-25 (×2): 20 mg via INTRAVENOUS

## 2016-03-25 MED ORDER — ENOXAPARIN SODIUM 30 MG/0.3ML ~~LOC~~ SOLN
30.0000 mg | Freq: Two times a day (BID) | SUBCUTANEOUS | Status: DC
  Administered 2016-03-25 – 2016-03-27 (×4): 30 mg via SUBCUTANEOUS
  Filled 2016-03-25 (×4): qty 0.3

## 2016-03-25 MED ORDER — SODIUM CHLORIDE 0.9 % IJ SOLN
INTRAMUSCULAR | Status: AC
  Filled 2016-03-25: qty 50

## 2016-03-25 MED ORDER — INSULIN ASPART 100 UNIT/ML ~~LOC~~ SOLN
0.0000 [IU] | SUBCUTANEOUS | Status: DC
  Administered 2016-03-25: 4 [IU] via SUBCUTANEOUS
  Administered 2016-03-25: 7 [IU] via SUBCUTANEOUS
  Administered 2016-03-25: 11 [IU] via SUBCUTANEOUS
  Administered 2016-03-26: 4 [IU] via SUBCUTANEOUS
  Administered 2016-03-26 (×4): 3 [IU] via SUBCUTANEOUS
  Administered 2016-03-26: 4 [IU] via SUBCUTANEOUS
  Administered 2016-03-27: 3 [IU] via SUBCUTANEOUS
  Administered 2016-03-27: 4 [IU] via SUBCUTANEOUS

## 2016-03-25 MED ORDER — INSULIN ASPART 100 UNIT/ML ~~LOC~~ SOLN
SUBCUTANEOUS | Status: AC
  Filled 2016-03-25: qty 1

## 2016-03-25 MED ORDER — ROCURONIUM BROMIDE 100 MG/10ML IV SOLN
INTRAVENOUS | Status: DC | PRN
  Administered 2016-03-25: 20 mg via INTRAVENOUS
  Administered 2016-03-25: 30 mg via INTRAVENOUS
  Administered 2016-03-25: 20 mg via INTRAVENOUS

## 2016-03-25 MED ORDER — EVICEL 5 ML EX KIT
PACK | Freq: Once | CUTANEOUS | Status: AC
  Administered 2016-03-25: 5 mL
  Filled 2016-03-25: qty 1

## 2016-03-25 MED ORDER — ACETAMINOPHEN 160 MG/5ML PO SOLN
325.0000 mg | ORAL | Status: DC | PRN
  Filled 2016-03-25: qty 20.3

## 2016-03-25 MED ORDER — LACTATED RINGERS IV SOLN
INTRAVENOUS | Status: DC | PRN
  Administered 2016-03-25 (×3): via INTRAVENOUS

## 2016-03-25 MED ORDER — CHLORHEXIDINE GLUCONATE CLOTH 2 % EX PADS: 6.0000 | MEDICATED_PAD | Freq: Once | CUTANEOUS | Status: DC

## 2016-03-25 MED ORDER — HEPARIN SODIUM (PORCINE) 5000 UNIT/ML IJ SOLN
5000.0000 [IU] | INTRAMUSCULAR | Status: AC
  Administered 2016-03-25: 5000 [IU] via SUBCUTANEOUS
  Filled 2016-03-25: qty 1

## 2016-03-25 MED ORDER — ACETAMINOPHEN 160 MG/5ML PO SOLN
650.0000 mg | ORAL | Status: DC | PRN
  Administered 2016-03-27 (×2): 650 mg via ORAL
  Filled 2016-03-25: qty 20.3

## 2016-03-25 MED ORDER — PROPOFOL 10 MG/ML IV BOLUS
INTRAVENOUS | Status: AC
  Filled 2016-03-25: qty 40

## 2016-03-25 MED ORDER — LEVOTHYROXINE SODIUM 100 MCG IV SOLR
25.0000 ug | Freq: Every day | INTRAVENOUS | Status: DC
  Administered 2016-03-26 – 2016-03-27 (×2): 25 ug via INTRAVENOUS
  Filled 2016-03-25 (×3): qty 5

## 2016-03-25 MED ORDER — ONDANSETRON HCL 4 MG/2ML IJ SOLN
4.0000 mg | INTRAMUSCULAR | Status: DC | PRN
  Administered 2016-03-25 – 2016-03-26 (×6): 4 mg via INTRAVENOUS
  Filled 2016-03-25 (×5): qty 2

## 2016-03-25 MED ORDER — 0.9 % SODIUM CHLORIDE (POUR BTL) OPTIME
TOPICAL | Status: DC | PRN
  Administered 2016-03-25: 1000 mL

## 2016-03-25 MED ORDER — STERILE WATER FOR IRRIGATION IR SOLN
Status: DC | PRN
Start: 2016-03-25 — End: 2016-03-25
  Administered 2016-03-25: 1500 mL

## 2016-03-25 MED ORDER — LACTATED RINGERS IR SOLN
Status: DC | PRN
  Administered 2016-03-25: 1000 mL

## 2016-03-25 MED ORDER — FENTANYL CITRATE (PF) 250 MCG/5ML IJ SOLN
INTRAMUSCULAR | Status: AC
  Filled 2016-03-25: qty 5

## 2016-03-25 MED ORDER — PROMETHAZINE HCL 25 MG/ML IJ SOLN
INTRAMUSCULAR | Status: AC
  Filled 2016-03-25: qty 1

## 2016-03-25 MED ORDER — SODIUM CHLORIDE 0.9 % IJ SOLN
INTRAMUSCULAR | Status: DC | PRN
  Administered 2016-03-25: 60 mL

## 2016-03-25 MED ORDER — FENTANYL CITRATE (PF) 100 MCG/2ML IJ SOLN
INTRAMUSCULAR | Status: AC
  Filled 2016-03-25: qty 2

## 2016-03-25 MED ORDER — LIDOCAINE HCL (CARDIAC) 20 MG/ML IV SOLN
INTRAVENOUS | Status: DC | PRN
  Administered 2016-03-25: 100 mg via INTRATRACHEAL

## 2016-03-25 MED ORDER — PROMETHAZINE HCL 25 MG/ML IJ SOLN
12.5000 mg | INTRAMUSCULAR | Status: DC | PRN
  Administered 2016-03-25 – 2016-03-27 (×4): 12.5 mg via INTRAVENOUS
  Filled 2016-03-25 (×4): qty 1

## 2016-03-25 MED ORDER — SUCCINYLCHOLINE CHLORIDE 20 MG/ML IJ SOLN
INTRAMUSCULAR | Status: DC | PRN
  Administered 2016-03-25: 160 mg via INTRAVENOUS

## 2016-03-25 MED ORDER — ONDANSETRON HCL 4 MG/2ML IJ SOLN
INTRAMUSCULAR | Status: AC
  Filled 2016-03-25: qty 2

## 2016-03-25 MED ORDER — PREMIER PROTEIN SHAKE
2.0000 [oz_av] | ORAL | Status: DC
Start: 2016-03-27 — End: 2016-03-27
  Administered 2016-03-27 (×4): 2 [oz_av] via ORAL

## 2016-03-25 MED ORDER — PROMETHAZINE HCL 25 MG/ML IJ SOLN
6.2500 mg | INTRAMUSCULAR | Status: DC | PRN
  Administered 2016-03-25: 6.25 mg via INTRAVENOUS

## 2016-03-25 MED ORDER — SODIUM CHLORIDE 0.9 % IJ SOLN
INTRAMUSCULAR | Status: AC
  Filled 2016-03-25: qty 10

## 2016-03-25 MED ORDER — BUPIVACAINE-EPINEPHRINE (PF) 0.5% -1:200000 IJ SOLN
INTRAMUSCULAR | Status: DC | PRN
  Administered 2016-03-25: 28 mL

## 2016-03-25 MED ORDER — MORPHINE SULFATE (PF) 2 MG/ML IV SOLN
2.0000 mg | INTRAVENOUS | Status: DC | PRN
  Administered 2016-03-25 (×7): 2 mg via INTRAVENOUS
  Administered 2016-03-26: 4 mg via INTRAVENOUS
  Administered 2016-03-26: 2 mg via INTRAVENOUS
  Administered 2016-03-26: 4 mg via INTRAVENOUS
  Administered 2016-03-27: 2 mg via INTRAVENOUS
  Filled 2016-03-25: qty 1
  Filled 2016-03-25: qty 2
  Filled 2016-03-25 (×2): qty 1
  Filled 2016-03-25: qty 2
  Filled 2016-03-25 (×2): qty 1
  Filled 2016-03-25: qty 2
  Filled 2016-03-25 (×3): qty 1

## 2016-03-25 MED ORDER — LIDOCAINE 2% (20 MG/ML) 5 ML SYRINGE
INTRAMUSCULAR | Status: AC
  Filled 2016-03-25: qty 5

## 2016-03-25 MED ORDER — MIDAZOLAM HCL 2 MG/2ML IJ SOLN
INTRAMUSCULAR | Status: AC
  Filled 2016-03-25: qty 2

## 2016-03-25 MED ORDER — DEXAMETHASONE SODIUM PHOSPHATE 10 MG/ML IJ SOLN
INTRAMUSCULAR | Status: DC | PRN
Start: 2016-03-25 — End: 2016-03-25
  Administered 2016-03-25: 10 mg via INTRAVENOUS

## 2016-03-25 SURGICAL SUPPLY — 90 items
ADH SKN CLS APL DERMABOND .7 (GAUZE/BANDAGES/DRESSINGS)
APL SRG 32X5 SNPLK LF DISP (MISCELLANEOUS)
APPLICATOR COTTON TIP 6IN STRL (MISCELLANEOUS) ×2 IMPLANT
APPLIER CLIP ROT 10 11.4 M/L (STAPLE)
APPLIER CLIP ROT 13.4 12 LRG (CLIP)
APR CLP LRG 13.4X12 ROT 20 MLT (CLIP)
APR CLP MED LRG 11.4X10 (STAPLE)
BAG SPEC RTRVL LRG 6X4 10 (ENDOMECHANICALS)
BLADE SURG SZ11 CARB STEEL (BLADE) ×3 IMPLANT
CABLE HIGH FREQUENCY MONO STRZ (ELECTRODE) ×3 IMPLANT
CHLORAPREP W/TINT 26ML (MISCELLANEOUS) ×3 IMPLANT
CLIP APPLIE ROT 10 11.4 M/L (STAPLE) IMPLANT
CLIP APPLIE ROT 13.4 12 LRG (CLIP) IMPLANT
CLIP SUT LAPRA TY ABSORB (SUTURE) ×6 IMPLANT
COVER SURGICAL LIGHT HANDLE (MISCELLANEOUS) ×2 IMPLANT
CUTTER FLEX LINEAR 45M (STAPLE) ×3 IMPLANT
DECANTER SPIKE VIAL GLASS SM (MISCELLANEOUS) ×3 IMPLANT
DERMABOND ADVANCED (GAUZE/BANDAGES/DRESSINGS)
DERMABOND ADVANCED .7 DNX12 (GAUZE/BANDAGES/DRESSINGS) ×1 IMPLANT
DEVICE SUT QUICK LOAD TK 5 (STAPLE) ×4 IMPLANT
DEVICE SUT TI-KNOT TK 5X26 (MISCELLANEOUS) ×1 IMPLANT
DEVICE SUTURE ENDOST 10MM (ENDOMECHANICALS) IMPLANT
DEVICE TI KNOT TK5 (MISCELLANEOUS) ×1
DISSECTOR BLUNT TIP ENDO 5MM (MISCELLANEOUS) IMPLANT
DRAIN PENROSE 18X1/4 LTX STRL (WOUND CARE) ×3 IMPLANT
ELECT REM PT RETURN 9FT ADLT (ELECTROSURGICAL) ×3
ELECTRODE REM PT RTRN 9FT ADLT (ELECTROSURGICAL) ×1 IMPLANT
GAUZE SPONGE 4X4 12PLY STRL (GAUZE/BANDAGES/DRESSINGS) ×1 IMPLANT
GAUZE SPONGE 4X4 16PLY XRAY LF (GAUZE/BANDAGES/DRESSINGS) ×3 IMPLANT
GLOVE BIOGEL PI IND STRL 7.5 (GLOVE) ×1 IMPLANT
GLOVE BIOGEL PI INDICATOR 7.5 (GLOVE) ×2
GLOVE ECLIPSE 7.5 STRL STRAW (GLOVE) ×3 IMPLANT
GOWN STRL REUS W/TWL XL LVL3 (GOWN DISPOSABLE) ×10 IMPLANT
HEMOSTAT SURGICEL 4X8 (HEMOSTASIS) IMPLANT
HOVERMATT SINGLE USE (MISCELLANEOUS) ×3 IMPLANT
IRRIG SUCT STRYKERFLOW 2 WTIP (MISCELLANEOUS) ×3
IRRIGATION SUCT STRKRFLW 2 WTP (MISCELLANEOUS) ×1 IMPLANT
KIT BASIN OR (CUSTOM PROCEDURE TRAY) ×3 IMPLANT
KIT GASTRIC LAVAGE 34FR ADT (SET/KITS/TRAYS/PACK) ×3 IMPLANT
LUBRICANT JELLY K Y 4OZ (MISCELLANEOUS) IMPLANT
MARKER SKIN DUAL TIP RULER LAB (MISCELLANEOUS) ×3 IMPLANT
NEEDLE SPNL 22GX3.5 QUINCKE BK (NEEDLE) ×3 IMPLANT
PACK CARDIOVASCULAR III (CUSTOM PROCEDURE TRAY) ×3 IMPLANT
POUCH SPECIMEN RETRIEVAL 10MM (ENDOMECHANICALS) IMPLANT
QUICK LOAD TK 5 (STAPLE) ×2
RELOAD 45 VASCULAR/THIN (ENDOMECHANICALS) ×6 IMPLANT
RELOAD ENDO STITCH 2.0 (ENDOMECHANICALS)
RELOAD STAPLE 60 2.6 WHT THN (STAPLE) ×1 IMPLANT
RELOAD STAPLE 60 3.8 GOLD REG (STAPLE) ×1 IMPLANT
RELOAD STAPLE TA45 3.5 REG BLU (ENDOMECHANICALS) ×6 IMPLANT
RELOAD STAPLER BLUE 60MM (STAPLE) ×3 IMPLANT
RELOAD STAPLER GOLD 60MM (STAPLE) ×1 IMPLANT
RELOAD STAPLER WHITE 60MM (STAPLE) ×1 IMPLANT
RELOAD SUT SNGL STCH BLK 2-0 (ENDOMECHANICALS) IMPLANT
SCISSORS LAP 5X45 EPIX DISP (ENDOMECHANICALS) ×3 IMPLANT
SEALANT SURGICAL APPL DUAL CAN (MISCELLANEOUS) ×1 IMPLANT
SHEARS HARMONIC ACE PLUS 45CM (MISCELLANEOUS) ×3 IMPLANT
SLEEVE ADV FIXATION 12X100MM (TROCAR) ×6 IMPLANT
SOLUTION ANTI FOG 6CC (MISCELLANEOUS) ×3 IMPLANT
STAPLER ECHELON LONG 60 440 (INSTRUMENTS) ×3 IMPLANT
STAPLER RELOAD BLUE 60MM (STAPLE) ×9
STAPLER RELOAD GOLD 60MM (STAPLE) ×3
STAPLER RELOAD WHITE 60MM (STAPLE) ×3
SUT DEVICE BRAIDED 0X39 (SUTURE) ×6 IMPLANT
SUT DVC SILK 2.0X39 (SUTURE) ×17 IMPLANT
SUT DVC VICRYL PGA 2.0X39 (SUTURE) ×15 IMPLANT
SUT MNCRL AB 4-0 PS2 18 (SUTURE) ×3 IMPLANT
SUT RELOAD ENDO STITCH 2 48X1 (ENDOMECHANICALS)
SUT RELOAD ENDO STITCH 2.0 (ENDOMECHANICALS)
SUT SURGIDAC NAB ES-9 0 48 120 (SUTURE) IMPLANT
SUT VIC AB 2-0 SH 27 (SUTURE)
SUT VIC AB 2-0 SH 27X BRD (SUTURE) IMPLANT
SUTURE RELOAD END STTCH 2 48X1 (ENDOMECHANICALS) IMPLANT
SUTURE RELOAD ENDO STITCH 2.0 (ENDOMECHANICALS) IMPLANT
SYR 10ML ECCENTRIC (SYRINGE) ×3 IMPLANT
SYR 20CC LL (SYRINGE) ×6 IMPLANT
SYR 50ML LL SCALE MARK (SYRINGE) ×3 IMPLANT
TIP RIGID 35CM EVICEL (HEMOSTASIS) ×3 IMPLANT
TOWEL OR 17X26 10 PK STRL BLUE (TOWEL DISPOSABLE) ×3 IMPLANT
TOWEL OR NON WOVEN STRL DISP B (DISPOSABLE) ×3 IMPLANT
TRAY FOLEY CATH 14FRSI W/METER (CATHETERS) ×2 IMPLANT
TRAY FOLEY W/METER SILVER 16FR (SET/KITS/TRAYS/PACK) IMPLANT
TROCAR ADV FIXATION 12X100MM (TROCAR) ×3 IMPLANT
TROCAR ADV FIXATION 5X100MM (TROCAR) ×3 IMPLANT
TROCAR BLADELESS OPT 5 100 (ENDOMECHANICALS) ×3 IMPLANT
TROCAR XCEL 12X100 BLDLESS (ENDOMECHANICALS) ×3 IMPLANT
TUBING CONNECTING 10 (TUBING) ×2 IMPLANT
TUBING CONNECTING 10' (TUBING) ×2
TUBING ENDO SMARTCAP PENTAX (MISCELLANEOUS) ×3 IMPLANT
TUBING INSUF HEATED (TUBING) ×3 IMPLANT

## 2016-03-25 NOTE — Op Note (Signed)
[name] [id number] 05/13/1966 03/25/2016  Preoperative diagnosis: morbid obesity  Postoperative diagnosis: Same   Procedure: Upper endoscopy   Surgeon: Gayland Curry M.D., FACS   Anesthesia: Gen.   Indications for procedure: 50 y.o. 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  Description of procedure: After we have completed the new gastrojejunostomy, I scrubbed out and obtained the Olympus endoscope. I gently placed endoscope in the patient's oropharynx and gently glided it down the esophagus without any difficulty under direct visualization. Once I was in the gastric pouch, I insufflated the pouch was air. The pouch was approximately 4 cm in size. I was able to cannulate and advanced the scope through the gastrojejunostomy. Dr.Hoxworth had placed saline in the upper abdomen. Upon further insufflation of the gastric pouch there was no evidence of bubbles. Upon further inspection of the gastric pouch, the mucosa appeared normal. There is no evidence of any mucosal abnormality. The gastric pouch and Roux limb were decompressed. The width of the gastrojejunal anastomosis was at least 2 cm. The scope was withdrawn. The patient tolerated this portion of the procedure well. Please see Dr Lear Ng operative note for details regarding the laparoscopic roux-en-y gastric bypass.  Leighton Ruff. Redmond Pulling, MD, FACS General, Bariatric, & Minimally Invasive Surgery [facility], [place]

## 2016-03-25 NOTE — Interval H&P Note (Signed)
History and Physical Interval Note:  03/25/2016 7:14 AM  [name]  has presented today for surgery, with the diagnosis of Morbid Obesity, DM, COPD, HTN  The various methods of treatment have been discussed with the patient and family. After consideration of risks, benefits and other options for treatment, the patient has consented to  Procedure(s): LAPAROSCOPIC ROUX-EN-Y GASTRIC BYPASS WITH UPPER ENDOSCOPY (N/A) as a surgical intervention .  The patient's history has been reviewed, patient examined, no change in status, stable for surgery.  I have reviewed the patient's chart and labs.  Questions were answered to the patient's satisfaction.     Naleigha Raimondi T

## 2016-03-25 NOTE — Transfer of Care (Signed)
Immediate Anesthesia Transfer of Care Note  Patient: [name]  Procedure(s) Performed: Procedure(s): LAPAROSCOPIC ROUX-EN-Y GASTRIC BYPASS WITH UPPER ENDOSCOPY (N/A)  Patient Location: PACU  Anesthesia Type:General  Level of Consciousness: alert , oriented and patient cooperative  Airway & Oxygen Therapy: Patient connected to face mask oxygen  Post-op Assessment: Post -op Vital signs reviewed and stable and Patient moving all extremities X 4  Post vital signs: stable  Last Vitals:  Vitals:   03/25/16 0524  BP: 106/72  Pulse: 96  Resp: 18  Temp: 36.8 C    Last Pain:  Vitals:   03/25/16 0555  TempSrc:   PainSc: 9          Complications: No apparent anesthesia complications

## 2016-03-25 NOTE — Anesthesia Preprocedure Evaluation (Addendum)
Anesthesia Evaluation  Patient identified by MRN, date of birth, ID band Patient awake    Reviewed: Allergy & Precautions, NPO status , Patient's Chart, lab work & pertinent test results  Airway Mallampati: II  TM Distance: >3 FB Neck ROM: Full    Dental no notable dental hx.    Pulmonary COPD, former smoker,    Pulmonary exam normal breath sounds clear to auscultation       Cardiovascular hypertension, Pt. on medications Normal cardiovascular exam Rhythm:Regular Rate:Normal  ECHO 2013: EF 55-60%   Neuro/Psych  Headaches, PSYCHIATRIC DISORDERS Anxiety Depression Schizophrenia  Neuromuscular disease    GI/Hepatic Neg liver ROS, GERD  ,  Endo/Other  diabetes, Type 2, Oral Hypoglycemic Agents, Insulin DependentHypothyroidism   Renal/GU negative Renal ROS  negative genitourinary   Musculoskeletal negative musculoskeletal ROS (+)   Abdominal (+) + obese,   Peds negative pediatric ROS (+)  Hematology negative hematology ROS (+)   Anesthesia Other Findings   Reproductive/Obstetrics negative OB ROS                            Anesthesia Physical Anesthesia Plan  ASA: III  Anesthesia Plan: General   Post-op Pain Management:    Induction: Intravenous  Airway Management Planned: Oral ETT  Additional Equipment:   Intra-op Plan:   Post-operative Plan: Extubation in OR  Informed Consent: I have reviewed the patients History and Physical, chart, labs and discussed the procedure including the risks, benefits and alternatives for the proposed anesthesia with the patient or authorized representative who has indicated his/her understanding and acceptance.   Dental advisory given  Plan Discussed with: CRNA  Anesthesia Plan Comments:         Anesthesia Quick Evaluation

## 2016-03-25 NOTE — Anesthesia Postprocedure Evaluation (Signed)
Anesthesia Post Note  Patient: [name]  Procedure(s) Performed: Procedure(s) (LRB): LAPAROSCOPIC ROUX-EN-Y GASTRIC BYPASS WITH UPPER ENDOSCOPY (N/A)  Patient location during evaluation: PACU Anesthesia Type: General Level of consciousness: awake and alert Pain management: pain level controlled Vital Signs Assessment: post-procedure vital signs reviewed and stable Respiratory status: spontaneous breathing, nonlabored ventilation, respiratory function stable and patient connected to nasal cannula oxygen Cardiovascular status: blood pressure returned to baseline and stable Postop Assessment: no signs of nausea or vomiting Anesthetic complications: no    Last Vitals:  Vitals:   03/25/16 1300 03/25/16 1357  BP: 132/72 132/81  Pulse: 98 100  Resp: 17   Temp: 37.2 C     Last Pain:  Vitals:   03/25/16 1357  TempSrc: Oral  PainSc: 4                  Beatryce Colombo J

## 2016-03-25 NOTE — Op Note (Signed)
Preop diagnosis: Morbid obesity  Postop diagnosis: Morbid obesity  Body mass index is 38.05 kg/m.  Surgical procedure: Laparoscopic Roux-en-Y gastric bypass  Surgeon: Marland Kitchen T.Jakeob Tullis M.D.  Asst.: Greer Pickerel M.D.  Anesthesia: General  Complications:  None  EBL: Minimal  Drains: None  Disposition: PACU in good condition  Description of procedure: Patient is brought to the operating room and general anesthesia induced. She had received preoperative broad-spectrum IV antibiotics and subcutaneous heparin. The abdomen was widely sterilely prepped and draped. Patient timeout was performed and correct patient and procedure confirmed. Access was obtained with a 12 mm Optiview trocar in the left upper quadrant and pneumoperitoneum established without difficulty. Under direct vision 12 mm trocars were placed laterally in the right upper quadrant, right upper quadrant midclavicular line, and to the left and above the umbilicus for the camera port. A 5 mm trocar was placed laterally in the left upper quadrant. A TAP block was performed bilaterally with Exparel. The omentum was brought into the upper abdomen and the transverse mesocolon elevated and the ligament of Treitz clearly identified. A 40 cm biliopancreatic limb was then carefully measured from the ligament of Treitz. The small intestine was divided at this point with a single firing of the white load linear stapler. A Penrose drain was sutured to the end of the Roux-en-Y limb for later identification. A 100 cm Roux-en-Y limb was then carefully measured. At this point a side-to-side anastomosis was created between the Roux limb and the end of the biliopancreatic limb. This was accomplished with a single firing of the 45 mm white load linear stapler. The common enterotomy was closed with a running 2-0 Vicryl begun at either end of the enterotomy and tied centrally. The mesenteric defect was then closed with running 2-0 silk. The omentum was then  divided with the harmonic scalpel up towards the transverse colon to allow mobility of the Roux limb toward the gastric pouch. The patient was then placed in steep reversed Trendelenburg. Through a 5 mm subxiphoid site the [facility] retractor was placed and the left lobe of the liver elevated with excellent exposure of the upper stomach and hiatus. The angle of Hiss was then mobilized with the harmonic scalpel. A 4 cm gastric pouch was then carefully measured along the lesser curve of the stomach. Dissection was carried along the lesser curve at this point with the Harmonic scalpel working carefully back toward the lesser sac at right angles to the lesser curve. The free lesser sac was then entered. After being sure all tubes were removed from the stomach an initial firing of the gold load 60 mm linear stapler was fired at right angles across the lesser curve for about 4 cm. The gastric pouch was further mobilized posteriorly and then the pouch was completed with 2 further firings of the 60 mm blue load linear stapler up through the previously dissected angle of His. It was ensured that the pouch was completely mobilized away from the gastric remnant. This created a nice tubular 4-5 cm gastric pouch. The staple line of the gastric remnant was then oversewn with 2-0 silk for hemostasis. The Roux limb was then brought up in an antecolic fashion with the candycane facing to the patient's left without undue tension. The gastrojejunostomy was created with an initial posterior row of 2-0 Vicryl between the Roux limb and the staple line of the gastric pouch. Enterotomies were then made in the gastric pouch and the Roux limb with the harmonic scalpel and at approximately  2-2-1/2 cm anastomosis was created with a single firing of the blue load linear stapler. The staple line was inspected and was intact without bleeding. The common enterotomy was then closed with running 2-0 Vicryl begun at either end and tied centrally. The  wall tube was then easily passed through the anastomosis and an outer anterior layer of running 2-0 Vicryl was placed. The Ewald tube was removed. With the outlet of the gastrojejunostomy clamped and under saline irrigation the assistant performed upper endoscopy and with the gastric pouch tensely distended with air there was no evidence of leak. The pouch was desufflated. The Terance Hart defect was closed with running 2-0 silk. The abdomen was inspected for any evidence of bleeding or bowel injury and everything looked fine. The Nathanson retractor was removed under direct vision after coating the anastomosis with Tisseel tissue sealant. All CO2 was evacuated and trochars removed. Skin incisions were closed with staples. Sponge needle and instrument counts were correct. The patient was taken to the PACU in good condition.     Edward Jolly MD, FACS  03/25/2016, 10:29 AM

## 2016-03-25 NOTE — Anesthesia Procedure Notes (Signed)
Procedure Name: Intubation Date/Time: 03/25/2016 7:27 AM Performed by: Pilar Grammes Pre-anesthesia Checklist: Patient identified, Emergency Drugs available, Suction available, Patient being monitored and Timeout performed Patient Re-evaluated:Patient Re-evaluated prior to inductionOxygen Delivery Method: Circle system utilized Preoxygenation: Pre-oxygenation with 100% oxygen Intubation Type: IV induction Ventilation: Mask ventilation without difficulty Laryngoscope Size: 3 and Mac Grade View: Grade I Tube type: Oral Tube size: 7.0 mm Number of attempts: 1 Airway Equipment and Method: Stylet Placement Confirmation: positive ETCO2,  ETT inserted through vocal cords under direct vision,  CO2 detector and breath sounds checked- equal and bilateral Secured at: 22 cm Tube secured with: Tape Dental Injury: Teeth and Oropharynx as per pre-operative assessment

## 2016-03-25 NOTE — H&P (Signed)
istory of Present Illness Marland Kitchen T. Keyon Liller MD; 03/12/2016 3:52 PM) Patient words: Discuss with Dr Excell Seltzer.  The patient is a 50 year old female who presents with obesity. She returns for a preoperative visit for for planning for laparoscopic gastric bypass. Her surgery had been scheduled but her psych eval and preop nutrition eval were scheduled very close to her surgery date. Both of these evaluations expressed concern about her understanding of the diet requirements and therefore I canceled her surgery. Since then she has had another follow-up preoperative visit with the dietitian that went much better and it was felt that she did have a good grasp of the diet requirements perioperatively and long-term following surgery. Other than expressing concern about her knowledge of the diet per psych eval was unremarkable. She reports no problems in the office today in terms of any health acute issues. She feels comfortable with understanding the perioperative course and postoperative requirements. The patient gives a history of progressive obesity since early adulthood despite multiple attempts at medical management. Obesity has been affecting the patient in a number of ways including increasing difficulty with activities of daily living and in joining things like playing with her grandson due to chronic joint pain. Significant co-morbid illnesses have developed including insulin-dependent diabetes mellitus, DJD with chronic joint pain, asthma, dyslipidemia and hypertension, GERD and hypoventilation requiring home oxygen. Dr. Loanne Drilling suggested that the patient may well benefit from weight loss surgery.   Problem List/Past Medical Edward Jolly, MD; 03/12/2016 3:52 PM) OBESITY, MORBID, BMI 40.0-49.9 (E66.01)  Other Problems Edward Jolly, MD; 03/12/2016 3:52 PM) Oophorectomy Right. Thyroid Disease Migraine Headache High blood pressure Home Oxygen  Use Hypercholesterolemia Chronic Obstructive Lung Disease Diabetes Mellitus Gastroesophageal Reflux Disease Back Pain Anxiety Disorder  Past Surgical History Edward Jolly, MD; 03/12/2016 3:52 PM) Knee Surgery Right.  Diagnostic Studies History Edward Jolly, MD; 03/12/2016 3:52 PM) Colonoscopy 1-5 years ago Mammogram 1-3 years ago  Allergies Patsey Berthold, [place]; 03/12/2016 3:38 PM) Aspirin *ANALGESICS - NonNarcotic*  Medication History Edward Jolly, MD; 03/12/2016 3:52 PM) DilTIAZem HCl ER Coated Beads (240MG  Capsule ER 24HR, Oral) Active. Ibuprofen (800MG  Tablet, Oral) Active. Latuda (40MG  Tablet, Oral) Active. Promethazine HCl (25MG  Tablet, Oral) Active. Toujeo SoloStar (300UNIT/ML Soln Pen-inj, Subcutaneous) Active. KlonoPIN (0.5MG  Tablet, Oral) Active. Neurontin (400MG  Capsule, Oral) Active. Advil (200MG  Capsule, Oral) Active. Latuda (60MG  Tablet, Oral) Active. Synthroid (50MCG Tablet, Oral) Active. Lyrica (75MG  Capsule, Oral) Active. MetFORMIN HCl (500MG  Tablet, Oral) Active. Opana ER (40MG  Tab 12HR Deter, Oral) Active. Percocet (10-325MG  Tablet, Oral) Active. Pred Forte (1% Suspension, Ophthalmic) Active. Deltasone (20MG  Tablet, Oral) Active. ProAir HFA (108 (90 Base)MCG/ACT Aerosol Soln, Inhalation) Active. Restasis (0.05% Emulsion, Ophthalmic) Active. Zocor (20MG  Tablet, Oral) Active. SUMAtriptan Succinate (6MG /0.5ML Solution, Subcutaneous) Active. TraZODone HCl (100MG  Tablet, Oral) Active. Voltaren (50MG  Tablet DR, Oral) Active. Lidoderm (5% Patch, External) Active. Cymbalta (30MG  Capsule DR Part, Oral) Active. HydrOXYzine HCl (50MG  Tablet, Oral) Active. Medications Reconciled OxyCODONE HCl (5MG /5ML Solution, 5-10 Milliliter Oral every four hours, as needed, Taken starting 03/12/2016) Active.  Social History Edward Jolly, MD; 03/12/2016 3:52 PM) Alcohol use Remotely quit alcohol use. Illicit  drug use Remotely quit drug use. Tobacco use Former smoker.  Family History Edward Jolly, MD; 03/12/2016 3:52 PM) Arthritis Mother, Sister. Depression Brother, Daughter, Mother. Heart Disease Father, Sister. Alcohol Abuse Brother. Hypertension Brother, Father, Mother, Sister. Migraine Headache Daughter, Mother, Sister. Thyroid problems Mother.  Pregnancy / Birth History Edward Jolly, MD; 03/12/2016 3:52 PM)  Para 3 Gravida 3 Maternal age 37-20  Vitals Patsey Berthold CMA; 03/12/2016 3:44 PM) 03/12/2016 3:38 PM Weight: 212.2 lb Height: 61.75in Body Surface Area: 1.95 m Body Mass Index: 39.13 kg/m  Temp.: 98.40F  Pulse: 97 (Regular)  BP: 142/72 (Sitting, Left Arm, Standard)       Physical Exam Marland Kitchen T. Zaya Kessenich MD; 03/12/2016 3:53 PM) The physical exam findings are as follows: Note:General: Alert, obese African-American female, in no distress Skin: Warm and dry without rash or infection. HEENT: No palpable masses or thyromegaly. Sclera nonicteric. Pupils equal round and reactive. Lymph nodes: No cervical, supraclavicular, or inguinal nodes palpable. Lungs: Breath sounds clear and equal. No wheezing or increased work of breathing. Cardiovascular: Regular rate and rhythm without murmer. No JVD or edema. Abdomen: Nondistended. Soft and nontender. No masses palpable. No organomegaly. No palpable hernias. Extremities: No edema or joint swelling or deformity. No chronic venous stasis changes. Neurologic: Alert and fully oriented. Gait normal. No focal weakness. Psychiatric: Normal mood and affect. Thought content appropriate with normal judgement and insight    Assessment & Plan Marland Kitchen T. Ritaj Dullea MD; 03/12/2016 3:55 PM) MORBID OBESITY (E66.01) Impression: Planning laparoscopic Roux-en-Y gastric bypass for morbid obesity and multiple comorbidities. At this point she appears to have a good understanding of the procedure, and his clearance  from psychiatry and from nutrition. We reviewed the procedure and expected recovery again today. She has completed the consent form and we went through this. We discussed major perioperative risks of anesthetic complications, bleeding, leakage or infection and DVT and long-term risks including nutritional difficulties, need to continue her multivitamin lifelong, ulcers, stricture and risk of bowel obstruction. All her questions were answered and she is ready to proceed. She is given prescriptions for pain medication, Protonix and Zofran.

## 2016-03-25 NOTE — Progress Notes (Signed)
Dr. Excell Seltzer via phone that pt has had ongoing nausea and vomiting since back from PACU. Sleeps at times then wakes up sick and in lots of pain. Vss with lowgrade temp. MD aware pt adamantly refused IS -offered for pt to use several times. See new orders received.

## 2016-03-25 NOTE — Op Note (Signed)
This is an addendum to the operative note for same date Prior to beginning the creation of the gastric pouch a moderately large hiatal hernia was noted. This was repaired. The gastrohepatic omentum was opened and the right crus exposed. Peritoneum anterior to the right crus was incised and blunt dissection was carried back down posterior to the esophagus in the retroesophageal space and both per a clearly defined. A posterior crural repair was done with 2-0 Ethibond sutures.

## 2016-03-26 ENCOUNTER — Inpatient Hospital Stay (HOSPITAL_COMMUNITY): Payer: Medicare Other

## 2016-03-26 LAB — CBC WITH DIFFERENTIAL/PLATELET
BASOS ABS: 0 10*3/uL (ref 0.0–0.1)
BASOS PCT: 0 %
EOS PCT: 0 %
Eosinophils Absolute: 0 10*3/uL (ref 0.0–0.7)
HCT: 36.8 % (ref 36.0–46.0)
Hemoglobin: 12.4 g/dL (ref 12.0–15.0)
Lymphocytes Relative: 16 %
Lymphs Abs: 1.8 10*3/uL (ref 0.7–4.0)
MCH: 27.4 pg (ref 26.0–34.0)
MCHC: 33.7 g/dL (ref 30.0–36.0)
MCV: 81.2 fL (ref 78.0–100.0)
MONO ABS: 0.8 10*3/uL (ref 0.1–1.0)
MONOS PCT: 7 %
Neutro Abs: 8.8 10*3/uL — ABNORMAL HIGH (ref 1.7–7.7)
Neutrophils Relative %: 77 %
PLATELETS: 212 10*3/uL (ref 150–400)
RBC: 4.53 MIL/uL (ref 3.87–5.11)
RDW: 13.9 % (ref 11.5–15.5)
WBC: 11.5 10*3/uL — ABNORMAL HIGH (ref 4.0–10.5)

## 2016-03-26 LAB — GLUCOSE, CAPILLARY
GLUCOSE-CAPILLARY: 151 mg/dL — AB (ref 65–99)
GLUCOSE-CAPILLARY: 130 mg/dL — AB (ref 65–99)
GLUCOSE-CAPILLARY: 149 mg/dL — AB (ref 65–99)
Glucose-Capillary: 139 mg/dL — ABNORMAL HIGH (ref 65–99)
Glucose-Capillary: 156 mg/dL — ABNORMAL HIGH (ref 65–99)

## 2016-03-26 LAB — HEMOGLOBIN AND HEMATOCRIT, BLOOD
HEMATOCRIT: 38.3 % (ref 36.0–46.0)
Hemoglobin: 12.7 g/dL (ref 12.0–15.0)

## 2016-03-26 MED ORDER — FENTANYL CITRATE (PF) 100 MCG/2ML IJ SOLN
25.0000 ug | INTRAMUSCULAR | Status: DC | PRN
  Administered 2016-03-26 – 2016-03-27 (×9): 25 ug via INTRAVENOUS
  Filled 2016-03-26 (×9): qty 2

## 2016-03-26 MED ORDER — PROMETHAZINE HCL 25 MG/ML IJ SOLN
12.5000 mg | Freq: Four times a day (QID) | INTRAMUSCULAR | Status: DC | PRN
  Administered 2016-03-26 (×2): 12.5 mg via INTRAVENOUS
  Filled 2016-03-26 (×3): qty 1

## 2016-03-26 MED ORDER — IOPAMIDOL (ISOVUE-300) INJECTION 61%
50.0000 mL | Freq: Once | INTRAVENOUS | Status: AC | PRN
  Administered 2016-03-26: 50 mL via ORAL

## 2016-03-26 MED ORDER — ACETAMINOPHEN 10 MG/ML IV SOLN
1000.0000 mg | Freq: Four times a day (QID) | INTRAVENOUS | Status: AC
  Administered 2016-03-26 (×3): 1000 mg via INTRAVENOUS
  Filled 2016-03-26 (×4): qty 100

## 2016-03-26 NOTE — Progress Notes (Signed)
Patient several episodes of vomiting especially after receiving IV Morphine.  At this time notified Dr. Kieth Brightly and tylenol Iv ordered.

## 2016-03-26 NOTE — Progress Notes (Addendum)
  Progress Note: Metabolic and Bariatric Surgery Service   Subjective: Mid abdominal pain and nausea overnight  Objective: Vital signs in last 24 hours: Temp:  [98.6 F (37 C)-100.9 F (38.3 C)] 99.1 F (37.3 C) (10/04 0451) Pulse Rate:  [85-124] 95 (10/04 0451) Resp:  [15-20] 16 (10/04 0451) BP: (128-168)/(69-114) 148/82 (10/04 0451) SpO2:  [95 %-100 %] 95 % (10/04 0451) Last BM Date: 03/24/16  Intake/Output from previous day: 10/03 0701 - 10/04 0700 In: 2956.7 [I.V.:2906.7; IV Piggyback:50] Out: 2113 [Urine:2060; Emesis/NG output:3; Blood:50] Intake/Output this shift: No intake/output data recorded.  Lungs: CTAB  Cardiovascular: RRR  Abd: soft, ATTP, wounds c/d/i  Extremities: no edema  Neuro: AOx4  Lab Results: CBC   Recent Labs  03/25/16 1606 03/26/16 0512  WBC  --  11.5*  HGB 13.4 12.4  HCT 40.5 36.8  PLT  --  212   BMET No results for input(s): NA, K, CL, CO2, GLUCOSE, BUN, CREATININE, CALCIUM in the last 72 hours. PT/INR No results for input(s): LABPROT, INR in the last 72 hours. ABG No results for input(s): PHART, HCO3 in the last 72 hours.  Invalid input(s): PCO2, PO2  Studies/Results:  Anti-infectives: Anti-infectives    Start     Dose/Rate Route Frequency Ordered Stop   03/25/16 0529  cefoTEtan in Dextrose 5% (CEFOTAN) IVPB 2 g     2 g Intravenous On call to O.R. 03/25/16 0529 03/25/16 0740      Medications: Scheduled Meds: . acetaminophen  1,000 mg Intravenous Q6H  . enoxaparin (LOVENOX) injection  30 mg Subcutaneous Q12H  . famotidine (PEPCID) IV  20 mg Intravenous Q12H  . insulin aspart  0-20 Units Subcutaneous Q4H  . insulin glargine  40 Units Subcutaneous QHS  . levothyroxine  25 mcg Intravenous Daily  . [START ON 03/27/2016] protein supplement shake  2 oz Oral Q2H   Continuous Infusions: . 0.9 % NaCl with KCl 20 mEq / L 100 mL/hr (03/25/16 2238)   PRN Meds:.oxyCODONE **AND** acetaminophen, acetaminophen (TYLENOL) oral  liquid 160 mg/5 mL, morphine injection, ondansetron (ZOFRAN) IV, promethazine  Assessment/Plan: Patient Active Problem List   Diagnosis Date Noted  . Morbid obesity ([place]) 03/25/2016  . Type 2 diabetes mellitus with diabetic polyneuropathy, with long-term current use of insulin ([place]) 09/06/2015  . Vaginitis and vulvovaginitis, unspecified 04/19/2013  . BV (bacterial vaginosis) 04/19/2013  . HTN (hypertension) 11/04/2011  . Hypokalemia 10/02/2011  . atrial fibrillation 09/30/2011  . Acute respiratory failure ([place]) 09/29/2011  . COPD (chronic obstructive pulmonary disease) ([place]) 09/29/2011  . Accidental drug overdose 09/29/2011   s/p Procedure(s): LAPAROSCOPIC ROUX-EN-Y GASTRIC BYPASS WITH UPPER ENDOSCOPY 03/25/2016 -UGI this morning, then if normal advance to POD 1 diet  Disposition:  LOS: 1 day  The patient will be in the hospital for normal postop protocol  Mickeal Skinner, MD [phone] [facility]

## 2016-03-27 LAB — CBC WITH DIFFERENTIAL/PLATELET
BASOS ABS: 0 10*3/uL (ref 0.0–0.1)
BASOS PCT: 0 %
EOS ABS: 0 10*3/uL (ref 0.0–0.7)
EOS PCT: 0 %
HCT: 38.4 % (ref 36.0–46.0)
Hemoglobin: 12.6 g/dL (ref 12.0–15.0)
Lymphocytes Relative: 32 %
Lymphs Abs: 3.6 10*3/uL (ref 0.7–4.0)
MCH: 27.6 pg (ref 26.0–34.0)
MCHC: 32.8 g/dL (ref 30.0–36.0)
MCV: 84 fL (ref 78.0–100.0)
MONO ABS: 0.7 10*3/uL (ref 0.1–1.0)
Monocytes Relative: 6 %
Neutro Abs: 7.1 10*3/uL (ref 1.7–7.7)
Neutrophils Relative %: 62 %
PLATELETS: 207 10*3/uL (ref 150–400)
RBC: 4.57 MIL/uL (ref 3.87–5.11)
RDW: 14.1 % (ref 11.5–15.5)
WBC: 11.4 10*3/uL — AB (ref 4.0–10.5)

## 2016-03-27 LAB — GLUCOSE, CAPILLARY
GLUCOSE-CAPILLARY: 125 mg/dL — AB (ref 65–99)
Glucose-Capillary: 101 mg/dL — ABNORMAL HIGH (ref 65–99)

## 2016-03-27 MED ORDER — ACETAMINOPHEN 160 MG/5ML PO LIQD: 320.0000 mg | ORAL | 0 refills | Status: DC | PRN

## 2016-03-27 MED ORDER — DIPHENHYDRAMINE HCL 25 MG PO CAPS
25.0000 mg | ORAL_CAPSULE | Freq: Four times a day (QID) | ORAL | Status: DC | PRN
  Administered 2016-03-27: 25 mg via ORAL
  Filled 2016-03-27: qty 1

## 2016-03-27 MED ORDER — INSULIN GLARGINE 300 UNIT/ML ~~LOC~~ SOPN: 40.0000 [IU] | PEN_INJECTOR | Freq: Every day | SUBCUTANEOUS | 1 refills | Status: DC

## 2016-03-27 NOTE — Progress Notes (Signed)
Patient alert and oriented, pain is controlled. Patient is tolerating fluids, advanced to protein shake today, patient is tolerating well since changing to warm liquids. Reviewed Gastric Bypass discharge instructions with patient and patient is able to articulate understanding. Provided information on BELT program, Support Group and [facility] outpatient pharmacy. All questions answered, will continue to monitor.

## 2016-03-27 NOTE — Plan of Care (Signed)
Problem: Food- and Nutrition-Related Knowledge Deficit (NB-1.1) Goal: Nutrition education Formal process to instruct or train a patient/client in a skill or to impart knowledge to help patients/clients voluntarily manage or modify food choices and eating behavior to maintain or improve health. Outcome: Completed/Met Date Met: 03/27/16 Nutrition Education Note  Received consult for diet education per DROP protocol.   Discussed 2 week post op diet with pt. Emphasized that liquids must be non carbonated, non caffeinated, and sugar free. Fluid goals discussed. Reviewed progression of diet to include soft proteins at 7-10 days post-op. Pt to follow up with outpatient bariatric RD for further diet progression after 2 weeks. Multivitamins and minerals also reviewed. Teach back method used, pt expressed understanding, expect good compliance.   Diet: First 2 Weeks  You will see the dietitian about two (2) weeks after your surgery. The dietitian will increase the types of foods you can eat if you are handling liquids well:  If you have severe vomiting or nausea and cannot handle clear liquids lasting longer than 1 day, call your surgeon  Protein Shake  Drink at least 2 ounces of shake 5-6 times per day  Each serving of protein shakes (usually 8 - 12 ounces) should have a minimum of:  15 grams of protein  And no more than 5 grams of carbohydrate  Goal for protein each day:  Men = 80 grams per day  Women = 60 grams per day  Protein powder may be added to fluids such as non-fat milk or Lactaid milk or Soy milk (limit to 35 grams added protein powder per serving)   Hydration  Slowly increase the amount of water and other clear liquids as tolerated (See Acceptable Fluids)  Slowly increase the amount of protein shake as tolerated  Sip fluids slowly and throughout the day  May use sugar substitutes in small amounts (no more than 6 - 8 packets per day; i.e. Splenda)   Fluid Goal  The first goal is to  drink at least 8 ounces of protein shake/drink per day (or as directed by the nutritionist); some examples of protein shakes are [employer], [employer], EAS Edge HP, and Unjury. See handout from pre-op Bariatric Education Class:  Slowly increase the amount of protein shake you drink as tolerated  You may find it easier to slowly sip shakes throughout the day  It is important to get your proteins in first  Your fluid goal is to drink 64 - 100 ounces of fluid daily  It may take a few weeks to build up to this  32 oz (or more) should be clear liquids  And  32 oz (or more) should be full liquids (see below for examples)  Liquids should not contain sugar, caffeine, or carbonation   Clear Liquids:  Water or Sugar-free flavored water (i.e. Fruit H2O, Propel)  Decaffeinated coffee or tea (sugar-free)  Crystal Lite, Wyler's Lite, Minute Maid Lite  Sugar-free Jell-O  Bouillon or broth  Sugar-free Popsicle: *Less than 20 calories each; Limit 1 per day   Full Liquids:  Protein Shakes/Drinks + 2 choices per day of other full liquids  Full liquids must be:  No More Than 12 grams of Carbs per serving  No More Than 3 grams of Fat per serving  Strained low-fat cream soup  Non-Fat milk  Fat-free Lactaid Milk  Sugar-free yogurt (Dannon Lite & Fit, Greek yogurt)     Clayton Bibles, MS, RD, LDN Pager: [phone] After Hours Pager: [phone]

## 2016-03-27 NOTE — Progress Notes (Signed)
Discharge planning, spoke with patient and spouse at beside. Discussed referral for [facility] for medication management, patient is agreeable. Chose [facility] for [facility] services, contacted [facility] for referral.  [phone]

## 2016-03-27 NOTE — Progress Notes (Signed)
  Progress Note: Metabolic and Bariatric Surgery Service   Subjective: Retching earlier today.   Objective: Vital signs in last 24 hours: Temp:  [98.2 F (36.8 C)-98.8 F (37.1 C)] 98.8 F (37.1 C) (10/05 0935) Pulse Rate:  [79-88] 83 (10/05 0935) Resp:  [16] 16 (10/05 0935) BP: (144-176)/(81-93) 169/93 (10/05 0935) SpO2:  [95 %-98 %] 95 % (10/05 0935) Last BM Date: 03/24/16  Intake/Output from previous day: 10/04 0701 - 10/05 0700 In: 650 [I.V.:400; IV Piggyback:250] Out: 1500 [Urine:1500] Intake/Output this shift: Total I/O In: 180 [P.O.:180] Out: -   Lungs: CTAB  Cardiovascular: RRR  Abd: soft, ATTP, ND  Extremities: no edema  Neuro: AOx4  Lab Results: CBC   Recent Labs  03/26/16 0512 03/26/16 1648 03/27/16 0442  WBC 11.5*  --  11.4*  HGB 12.4 12.7 12.6  HCT 36.8 38.3 38.4  PLT 212  --  207   BMET No results for input(s): NA, K, CL, CO2, GLUCOSE, BUN, CREATININE, CALCIUM in the last 72 hours. PT/INR No results for input(s): LABPROT, INR in the last 72 hours. ABG No results for input(s): PHART, HCO3 in the last 72 hours.  Invalid input(s): PCO2, PO2  Studies/Results:  Anti-infectives: Anti-infectives    Start     Dose/Rate Route Frequency Ordered Stop   03/25/16 0529  cefoTEtan in Dextrose 5% (CEFOTAN) IVPB 2 g     2 g Intravenous On call to O.R. 03/25/16 0529 03/25/16 0740      Medications: Scheduled Meds: . enoxaparin (LOVENOX) injection  30 mg Subcutaneous Q12H  . famotidine (PEPCID) IV  20 mg Intravenous Q12H  . insulin aspart  0-20 Units Subcutaneous Q4H  . insulin glargine  40 Units Subcutaneous QHS  . levothyroxine  25 mcg Intravenous Daily  . protein supplement shake  2 oz Oral Q2H   Continuous Infusions: . 0.9 % NaCl with KCl 20 mEq / L 100 mL/hr at 03/27/16 1004   PRN Meds:.oxyCODONE **AND** acetaminophen, acetaminophen (TYLENOL) oral liquid 160 mg/5 mL, diphenhydrAMINE, fentaNYL (SUBLIMAZE) injection, ondansetron (ZOFRAN)  IV, promethazine, promethazine  Assessment/Plan: Patient Active Problem List   Diagnosis Date Noted  . Morbid obesity ([place]) 03/25/2016  . Type 2 diabetes mellitus with diabetic polyneuropathy, with long-term current use of insulin ([place]) 09/06/2015  . Vaginitis and vulvovaginitis, unspecified 04/19/2013  . BV (bacterial vaginosis) 04/19/2013  . HTN (hypertension) 11/04/2011  . Hypokalemia 10/02/2011  . atrial fibrillation 09/30/2011  . Acute respiratory failure ([place]) 09/29/2011  . COPD (chronic obstructive pulmonary disease) ([place]) 09/29/2011  . Accidental drug overdose 09/29/2011   s/p Procedure(s): LAPAROSCOPIC ROUX-EN-Y GASTRIC BYPASS WITH UPPER ENDOSCOPY 03/25/2016 -tolerating more food, working on taste and occasional cramping. -will add benadryl for pruritus -if patient tolerates a few 139ml more liquid can be discharged today  Disposition:  LOS: 2 days  The patient should be discharged from the hospital today  Mickeal Skinner, MD [phone] [facility]

## 2016-03-27 NOTE — Discharge Instructions (Signed)
° ° ° °GASTRIC BYPASS/SLEEVE ° Home Care Instructions ° ° These instructions are to help you care for yourself when you go home. ° °Call: If you have any problems. °• Call [phone] and ask for the surgeon on call °• If you need immediate assistance come to the ER at [facility]. Tell the ER staff you are a new post-op gastric bypass or gastric sleeve patient  °Signs and symptoms to report: • Severe  vomiting or nausea °o If you cannot handle clear liquids for longer than 1 day, call your surgeon °• Abdominal pain which does not get better after taking your pain medication °• Fever greater than 100.4°  F and chills °• Heart rate over 100 beats a minute °• Trouble breathing °• Chest pain °• Redness,  swelling, drainage, or foul odor at incision (surgical) sites °• If your incisions open or pull apart °• Swelling or pain in calf (lower leg) °• Diarrhea (Loose bowel movements that happen often), frequent watery, uncontrolled bowel movements °• Constipation, (no bowel movements for 3 days) if this happens: °o Take Milk of Magnesia, 2 tablespoons by mouth, 3 times a day for 2 days if needed °o Stop taking Milk of Magnesia once you have had a bowel movement °o Call your doctor if constipation continues °Or °o Take Miralax  (instead of Milk of Magnesia) following the label instructions °o Stop taking Miralax once you have had a bowel movement °o Call your doctor if constipation continues °• Anything you think is “abnormal for you” °  °Normal side effects after surgery: • Unable to sleep at night or unable to concentrate °• Irritability °• Being tearful (crying) or depressed ° °These are common complaints, possibly related to your anesthesia, stress of surgery, and change in lifestyle, that usually go away a few weeks after surgery. If these feelings continue, call your medical doctor.  °Wound Care: You may have surgical glue, steri-strips, or staples over your incisions after surgery °• Surgical glue: Looks like clear  film over your incisions and will wear off a little at a time °• Steri-strips: Adhesive strips of tape over your incisions. You may notice a yellowish color on skin under the steri-strips. This is used to make the steri-strips stick better. Do not pull the steri-strips off - let them fall off °• Staples: Staples may be removed before you leave the hospital °o If you go home with staples, call [facility] for an appointment with your surgeon’s nurse to have staples removed 10 days after surgery, [phone] °• Showering: You may shower two (2) days after your surgery unless your surgeon tells you differently °o Wash gently around incisions with warm soapy water, rinse well, and gently pat dry °o If you have a drain (tube from your incision), you may need someone to hold this while you shower °o No tub baths until staples are removed and incisions are healed °  °Medications: • Medications should be liquid or crushed if larger than the size of a dime °• Extended release pills (medication that releases a little bit at a time through the  day) should not be crushed °• Depending on the size and number of medications you take, you may need to space (take a few throughout the day)/change the time you take your medications so that you do not over-fill your pouch (smaller stomach) °• Make sure you follow-up with you primary care physician to make medication changes needed during rapid weight loss and life -style changes °•   If you have diabetes, follow up with your doctor that orders your diabetes medication(s) within one week after surgery and check your blood sugar regularly ° °• Do not drive while taking narcotics (pain medications) ° °• Do not take acetaminophen (Tylenol) and Roxicet or Lortab Elixir at the same time since these pain medications contain acetaminophen °  °Diet:  °First 2 Weeks You will see the nutritionist about two (2) weeks after your surgery. The nutritionist will increase the types of  foods you can eat if you are handling liquids well: °• If you have severe vomiting or nausea and cannot handle clear liquids lasting longer than 1 day call your surgeon °Protein Shake °• Drink at least 2 ounces of shake 5-6 times per day °• Each serving of protein shakes (usually 8-12 ounces) should have a minimum of: °o 15 grams of protein °o And no more than 5 grams of carbohydrate °• Goal for protein each day: °o Men = 80 grams per day °o Women = 60 grams per day °  ° • Protein powder may be added to fluids such as non-fat milk or Lactaid milk or Soy milk (limit to 35 grams added protein powder per serving) ° °Hydration °• Slowly increase the amount of water and other clear liquids as tolerated (See Acceptable Fluids) °• Slowly increase the amount of protein shake as tolerated °• Sip fluids slowly and throughout the day °• May use sugar substitutes in small amounts (no more than 6-8 packets per day; i.e. Splenda) ° °Fluid Goal °• The first goal is to drink at least 8 ounces of protein shake/drink per day (or as directed by the nutritionist); some examples of protein shakes are Syntrax Nectar, Adkins Advantage, EAS Edge HP, and Unjury. - See handout from pre-op Bariatric Education Class: °o Slowly increase the amount of protein shake you drink as tolerated °o You may find it easier to slowly sip shakes throughout the day °o It is important to get your proteins in first °• Your fluid goal is to drink 64-100 ounces of fluid daily °o It may take a few weeks to build up to this  °• 32 oz. (or more) should be clear liquids °And °• 32 oz. (or more) should be full liquids (see below for examples) °• Liquids should not contain sugar, caffeine, or carbonation ° °Clear Liquids: °• Water of Sugar-free flavored water (i.e. Fruit H²O, Propel) °• Decaffeinated coffee or tea (sugar-free) °• Crystal lite, Wyler’s Lite, Minute Maid Lite °• Sugar-free Jell-O °• Bouillon or broth °• Sugar-free Popsicle:    - Less than 20 calories  each; Limit 1 per day ° °Full Liquids: °                  Protein Shakes/Drinks + 2 choices per day of other full liquids °• Full liquids must be: °o No More Than 12 grams of Carbs per serving °o No More Than 3 grams of Fat per serving °• Strained low-fat cream soup °• Non-Fat milk °• Fat-free Lactaid Milk °• Sugar-free yogurt (Dannon Lite & Fit, Greek yogurt) ° °  °Vitamins and Minerals • Start 1 day after surgery unless otherwise directed by your surgeon °• 2 Chewable Multivitamin / Multimineral Supplement with iron (i.e. Centrum for Adults) °• Vitamin B-12, 350-500 micrograms sub-lingual (place tablet under the tongue) each day °• Chewable Calcium Citrate with Vitamin D-3 °(Example: 3 Chewable Calcium  Plus 600 with Vitamin D-3) °o Take 500 mg three (3) times a day for   a total of 1500 mg each day °o Do not take all 3 doses of calcium at one time as it may cause constipation, and you can only absorb 500 mg at a time °o Do not mix multivitamins containing iron with calcium supplements;  take 2 hours apart °o Do not substitute Tums (calcium carbonate) for your calcium °• Menstruating women and those at risk for anemia ( a blood disease that causes weakness) may need extra iron °o Talk to your doctor to see if you need more iron °• If you need extra iron: Total daily Iron recommendation (including Vitamins) is 50 to 100 mg Iron/day °• Do not stop taking or change any vitamins or minerals until you talk to your nutritionist or surgeon °• Your nutritionist and/or surgeon must approve all vitamin and mineral supplements °  °Activity and Exercise: It is important to continue walking at home. Limit your physical activity as instructed by your doctor. During this time, use these guidelines: °• Do not lift anything greater than ten  (10) pounds for at least two (2) weeks °• Do not go back to work or drive until your surgeon says you can °• You may have sex when you feel comfortable °o It is VERY important for female  patients to use a reliable birth control method; fertility often increase after surgery °o Do not get pregnant for at least 18 months °• Start exercising as soon as your doctor tells you that you can °o Make sure your doctor approves any physical activity °• Start with a simple walking program °• Walk 5-15 minutes each day, 7 days per week °• Slowly increase until you are walking 30-45 minutes per day °• Consider joining our BELT program. [phone] or email [email or web address] °  °Special Instructions Things to remember: °• Free counseling is available for you and your family through collaboration between [facility] and [employer]. Please call [phone] and leave a message °• Use your CPAP when sleeping if this applies to you °• Consider buying a medical alert bracelet that says you had lap-band surgery °  °  You will likely have your first fill (fluid added to your band) 6 - 8 weeks after surgery °• [facility] has a free Bariatric Surgery Support Group that meets monthly, the 3rd Thursday, 6pm. [facility] Classrooms. You can see classes online at [email or web address] °• It is very important to keep all follow up appointments with your surgeon, nutritionist, primary care physician, and behavioral health practitioner °o After the first year, please follow up with your bariatric surgeon and nutritionist at least once a year in order to maintain best weight loss results °      °             [facility]:  [phone] ° °             [facility]: [phone] ° °             Bariatric Nurse Coordinator: [phone]  °Gastric Bypass/Sleeve Home Care Instructions  Rev. 07/2012    ° °                                                    Reviewed and Endorsed °                                                     by [facility] Patient Education Committee, Jan, 2014 ° ° ° ° ° ° ° ° ° °

## 2016-03-27 NOTE — Progress Notes (Signed)
Patient alert and oriented with pain controlled. Patient given discharge instructions and prescriptions. All questions and concerns answered. Patient verbalized understanding of all instructions.

## 2016-03-27 NOTE — Discharge Summary (Signed)
Physician Discharge Summary  Patient ID: [name] MRN: [id number] DOB/AGE: 1966/01/29 50 y.o.  Admit date: 03/25/2016 Discharge date: 03/27/2016  Admission Diagnoses:  Discharge Diagnoses:  Active Problems:   Morbid obesity ([place])   Discharged Condition: good  Hospital Course: 50 yo female underwent lap gastric bypass. Post op she was admitted to surgical floor. POD 1 she had moderate nausea issues and a reaction of nausea to morphine. Appropriate adjustments were made with improvement. POD 2 she was able to tolerate fluid and protein shake and was discharged home  Consults: None  Significant Diagnostic Studies: labs: WBC 11.4  Treatments: surgery: lap gastric bypass  Discharge Exam: Blood pressure 134/83, pulse 83, temperature 98.5 F (36.9 C), temperature source Oral, resp. rate 16, height 5\' 1"  (1.549 m), weight 91.3 kg (201 lb 6 oz), last menstrual period 01/08/2011, SpO2 96 %. General appearance: alert and cooperative Head: Normocephalic, without obvious abnormality, atraumatic Neck: no adenopathy, no carotid bruit, no JVD, supple, symmetrical, trachea midline and thyroid not enlarged, symmetric, no tenderness/mass/nodules Resp: clear to auscultation bilaterally Cardio: regular rate and rhythm, S1, S2 normal, no murmur, click, rub or gallop GI: appropriately tender to palpation  Disposition: 01-Home or Self Care  Discharge Instructions    Call MD for:  difficulty breathing, headache or visual disturbances    Complete by:  As directed    Call MD for:  persistant nausea and vomiting    Complete by:  As directed    Call MD for:  redness, tenderness, or signs of infection (pain, swelling, redness, odor or green/yellow discharge around incision site)    Complete by:  As directed    Call MD for:  severe uncontrolled pain    Complete by:  As directed    Call MD for:  temperature >100.4    Complete by:  As directed    Discharge wound care:    Complete by:  As  directed    Ok to shower tomorrow  Glue will likely peel off in 1-3 weeks   Increase activity slowly    Complete by:  As directed    Lifting restrictions    Complete by:  As directed    Do not lift more than 20 pounds for 3-4 weeks       Medication List    STOP taking these medications   diclofenac sodium 1 % Gel Commonly known as:  VOLTAREN   ibuprofen 800 MG tablet Commonly known as:  ADVIL,MOTRIN   metroNIDAZOLE 500 MG tablet Commonly known as:  FLAGYL   neomycin 500 MG tablet Commonly known as:  MYCIFRADIN   omeprazole 20 MG capsule Commonly known as:  PRILOSEC   oxyCODONE-acetaminophen 10-325 MG tablet Commonly known as:  PERCOCET   OXYGEN   promethazine 25 MG tablet Commonly known as:  PHENERGAN     TAKE these medications   acetaminophen 160 MG/5ML liquid Commonly known as:  TYLENOL Take 10 mLs (320 mg total) by mouth every 4 (four) hours as needed for fever.   clonazePAM 0.5 MG tablet Commonly known as:  KLONOPIN Take 0.5 mg by mouth 3 (three) times daily as needed for anxiety.   cycloSPORINE 0.05 % ophthalmic emulsion Commonly known as:  RESTASIS Place 1 drop into both eyes 2 (two) times daily.   diltiazem 240 MG 24 hr capsule Commonly known as:  CARDIZEM CD Take 240 mg by mouth daily. Notes to patient:  This medication cannot be cut in half or crushed if too large to  swallow discuss with prescribing physician the need to change to an immediate release formula   DULoxetine 60 MG capsule Commonly known as:  CYMBALTA Take 60 mg by mouth at bedtime.   fluorometholone 0.1 % ophthalmic suspension Commonly known as:  FML Place 1 drop into both eyes daily.   hydrOXYzine 50 MG tablet Commonly known as:  ATARAX/VISTARIL Take 50 mg by mouth 3 (three) times daily as needed for itching.   Insulin Glargine 300 UNIT/ML Sopn Commonly known as:  TOUJEO SOLOSTAR Inject 40 Units into the skin daily after breakfast. What changed:  how much to take    LATUDA 60 MG Tabs Generic drug:  Lurasidone HCl Take 60 mg by mouth at bedtime.   levothyroxine 50 MCG tablet Commonly known as:  SYNTHROID, LEVOTHROID Take 50 mcg by mouth daily.   metFORMIN 500 MG tablet Commonly known as:  GLUCOPHAGE Take 500 mg by mouth 2 (two) times daily with a meal. Notes to patient:  Monitor Blood Sugar Frequently and keep a log for primary care physician, you may need to adjust medication dosage with rapid weight loss.     methocarbamol 500 MG tablet Commonly known as:  ROBAXIN Take 500 mg by mouth 2 (two) times daily as needed for muscle spasms.   ondansetron 4 MG tablet Commonly known as:  ZOFRAN Take 4 mg by mouth every 6 (six) hours as needed for nausea or vomiting.   oxymorphone 40 MG 12 hr tablet Commonly known as:  OPANA ER Take 40 mg by mouth every 8 (eight) hours. scheduled Notes to patient:  This medication cannot be crushed or cut in half, if too large to swallow you will need to discuss with prescribing physician the need to change to an immediate release formula   pantoprazole 40 MG tablet Commonly known as:  PROTONIX Take 40 mg by mouth daily.   pregabalin 75 MG capsule Commonly known as:  LYRICA Take 150 mg by mouth 2 (two) times daily.   PROAIR HFA 108 (90 Base) MCG/ACT inhaler Generic drug:  albuterol Inhale 2 puffs into the lungs every 6 (six) hours as needed for wheezing or shortness of breath.   REFRESH OP Place 1 drop into both eyes 2 (two) times daily as needed (dry eyes).   simvastatin 20 MG tablet Commonly known as:  ZOCOR Take 20 mg by mouth daily.   SUMAtriptan 6 MG/0.5ML Soaj Inject 6 mg into the muscle daily as needed (migraines).   traZODone 100 MG tablet Commonly known as:  DESYREL Take 100 mg by mouth at bedtime.      Follow-up Information    [facility] .   Why:  nurse for medication management Contact information: [address] [place] [address] [phone]            Signed: Arta Bruce Saron Vanorman 03/27/2016, 3:53 PM

## 2016-03-27 NOTE — Progress Notes (Signed)
Patient was given IV morphine and had episodes of nausea. Patient switched to IV fentanyl with no adverse side effects. Given phenergan 12.5mg  IV, effective results. Will continue to monitor.

## 2016-03-30 ENCOUNTER — Encounter (HOSPITAL_COMMUNITY): Payer: Self-pay | Admitting: Emergency Medicine

## 2016-03-30 ENCOUNTER — Emergency Department (HOSPITAL_COMMUNITY)
Admission: EM | Admit: 2016-03-30 | Discharge: 2016-03-31 | Disposition: A | Discharge status: Home / Self Care | Payer: Medicare Other | Source: Home / Self Care | Attending: Emergency Medicine | Admitting: Emergency Medicine

## 2016-03-30 DIAGNOSIS — I10 Essential (primary) hypertension: Secondary | ICD-10-CM

## 2016-03-30 DIAGNOSIS — R109 Unspecified abdominal pain: Secondary | ICD-10-CM | POA: Diagnosis present

## 2016-03-30 DIAGNOSIS — J449 Chronic obstructive pulmonary disease, unspecified: Secondary | ICD-10-CM

## 2016-03-30 DIAGNOSIS — Z87891 Personal history of nicotine dependence: Secondary | ICD-10-CM

## 2016-03-30 DIAGNOSIS — Z79899 Other long term (current) drug therapy: Secondary | ICD-10-CM | POA: Diagnosis not present

## 2016-03-30 DIAGNOSIS — E039 Hypothyroidism, unspecified: Secondary | ICD-10-CM

## 2016-03-30 DIAGNOSIS — R1012 Left upper quadrant pain: Secondary | ICD-10-CM | POA: Insufficient documentation

## 2016-03-30 DIAGNOSIS — G8918 Other acute postprocedural pain: Secondary | ICD-10-CM | POA: Diagnosis not present

## 2016-03-30 DIAGNOSIS — E1140 Type 2 diabetes mellitus with diabetic neuropathy, unspecified: Secondary | ICD-10-CM

## 2016-03-30 DIAGNOSIS — Z794 Long term (current) use of insulin: Secondary | ICD-10-CM | POA: Diagnosis not present

## 2016-03-30 DIAGNOSIS — Z7984 Long term (current) use of oral hypoglycemic drugs: Secondary | ICD-10-CM

## 2016-03-30 NOTE — ED Triage Notes (Signed)
Pt reports back pain that started earlier today and also pain in left upper abd near surgical site from gastric bypass. Pt states that she coughed appx 2 hr ago and pain began to get worse. Pt states surgery was done on 03/25/16 and d/c'd on 03/27/16

## 2016-03-31 ENCOUNTER — Emergency Department (HOSPITAL_COMMUNITY)
Admission: EM | Admit: 2016-03-31 | Discharge: 2016-04-01 | Disposition: A | Discharge status: Home / Self Care | Payer: Medicare Other | Attending: Emergency Medicine | Admitting: Emergency Medicine

## 2016-03-31 ENCOUNTER — Ambulatory Visit (HOSPITAL_COMMUNITY)
Admission: EM | Admit: 2016-03-31 | Discharge: 2016-03-31 | Disposition: A | Discharge status: Home / Self Care | Payer: No Typology Code available for payment source

## 2016-03-31 ENCOUNTER — Encounter (HOSPITAL_COMMUNITY): Payer: Self-pay

## 2016-03-31 ENCOUNTER — Emergency Department (HOSPITAL_COMMUNITY): Payer: Medicare Other

## 2016-03-31 DIAGNOSIS — R1012 Left upper quadrant pain: Secondary | ICD-10-CM | POA: Insufficient documentation

## 2016-03-31 DIAGNOSIS — I10 Essential (primary) hypertension: Secondary | ICD-10-CM | POA: Insufficient documentation

## 2016-03-31 DIAGNOSIS — R109 Unspecified abdominal pain: Secondary | ICD-10-CM

## 2016-03-31 DIAGNOSIS — E1140 Type 2 diabetes mellitus with diabetic neuropathy, unspecified: Secondary | ICD-10-CM | POA: Insufficient documentation

## 2016-03-31 DIAGNOSIS — G8918 Other acute postprocedural pain: Secondary | ICD-10-CM | POA: Insufficient documentation

## 2016-03-31 DIAGNOSIS — Z794 Long term (current) use of insulin: Secondary | ICD-10-CM | POA: Insufficient documentation

## 2016-03-31 DIAGNOSIS — Z79899 Other long term (current) drug therapy: Secondary | ICD-10-CM | POA: Insufficient documentation

## 2016-03-31 DIAGNOSIS — J449 Chronic obstructive pulmonary disease, unspecified: Secondary | ICD-10-CM | POA: Insufficient documentation

## 2016-03-31 DIAGNOSIS — Z87891 Personal history of nicotine dependence: Secondary | ICD-10-CM | POA: Insufficient documentation

## 2016-03-31 DIAGNOSIS — E039 Hypothyroidism, unspecified: Secondary | ICD-10-CM | POA: Insufficient documentation

## 2016-03-31 LAB — CBC WITH DIFFERENTIAL/PLATELET
BASOS ABS: 0 10*3/uL (ref 0.0–0.1)
Basophils Relative: 0 %
Eosinophils Absolute: 0.1 10*3/uL (ref 0.0–0.7)
Eosinophils Relative: 2 %
HEMATOCRIT: 38.6 % (ref 36.0–46.0)
Hemoglobin: 12.3 g/dL (ref 12.0–15.0)
LYMPHS ABS: 3 10*3/uL (ref 0.7–4.0)
LYMPHS PCT: 33 %
MCH: 27.5 pg (ref 26.0–34.0)
MCHC: 31.9 g/dL (ref 30.0–36.0)
MCV: 86.2 fL (ref 78.0–100.0)
MONO ABS: 0.5 10*3/uL (ref 0.1–1.0)
MONOS PCT: 5 %
NEUTROS ABS: 5.5 10*3/uL (ref 1.7–7.7)
Neutrophils Relative %: 60 %
Platelets: 229 10*3/uL (ref 150–400)
RBC: 4.48 MIL/uL (ref 3.87–5.11)
RDW: 14 % (ref 11.5–15.5)
WBC: 9.1 10*3/uL (ref 4.0–10.5)

## 2016-03-31 LAB — URINALYSIS, ROUTINE W REFLEX MICROSCOPIC
BILIRUBIN URINE: NEGATIVE
GLUCOSE, UA: NEGATIVE mg/dL
Hgb urine dipstick: NEGATIVE
KETONES UR: NEGATIVE mg/dL
Leukocytes, UA: NEGATIVE
NITRITE: NEGATIVE
PH: 6 (ref 5.0–8.0)
Protein, ur: NEGATIVE mg/dL
Specific Gravity, Urine: 1.017 (ref 1.005–1.030)

## 2016-03-31 LAB — COMPREHENSIVE METABOLIC PANEL
ALT: 27 U/L (ref 14–54)
AST: 17 U/L (ref 15–41)
Albumin: 3.8 g/dL (ref 3.5–5.0)
Alkaline Phosphatase: 68 U/L (ref 38–126)
Anion gap: 6 (ref 5–15)
BILIRUBIN TOTAL: 0.7 mg/dL (ref 0.3–1.2)
BUN: 10 mg/dL (ref 6–20)
CO2: 26 mmol/L (ref 22–32)
Calcium: 9.1 mg/dL (ref 8.9–10.3)
Chloride: 106 mmol/L (ref 101–111)
Creatinine, Ser: 0.79 mg/dL (ref 0.44–1.00)
Glucose, Bld: 143 mg/dL — ABNORMAL HIGH (ref 65–99)
POTASSIUM: 4 mmol/L (ref 3.5–5.1)
Sodium: 138 mmol/L (ref 135–145)
TOTAL PROTEIN: 6.2 g/dL — AB (ref 6.5–8.1)

## 2016-03-31 LAB — LIPASE, BLOOD: LIPASE: 36 U/L (ref 11–51)

## 2016-03-31 LAB — I-STAT CG4 LACTIC ACID, ED: Lactic Acid, Venous: 0.64 mmol/L (ref 0.5–1.9)

## 2016-03-31 MED ORDER — SODIUM CHLORIDE 0.9 % IV BOLUS (SEPSIS)
1000.0000 mL | Freq: Once | INTRAVENOUS | Status: AC
  Administered 2016-03-31: 1000 mL via INTRAVENOUS

## 2016-03-31 MED ORDER — ONDANSETRON 8 MG PO TBDP
8.0000 mg | ORAL_TABLET | Freq: Once | ORAL | Status: AC
  Administered 2016-03-31: 8 mg via ORAL
  Filled 2016-03-31: qty 1

## 2016-03-31 MED ORDER — HYDROMORPHONE HCL 1 MG/ML IJ SOLN
1.0000 mg | Freq: Once | INTRAMUSCULAR | Status: AC
  Administered 2016-03-31: 1 mg via INTRAVENOUS
  Filled 2016-03-31: qty 1

## 2016-03-31 MED ORDER — IOPAMIDOL (ISOVUE-300) INJECTION 61%
INTRAVENOUS | Status: AC
  Administered 2016-03-31: 100 mL
  Filled 2016-03-31: qty 30

## 2016-03-31 MED ORDER — HYDROMORPHONE HCL 1 MG/ML IJ SOLN
2.0000 mg | Freq: Once | INTRAMUSCULAR | Status: AC
  Administered 2016-03-31: 2 mg via INTRAMUSCULAR
  Filled 2016-03-31: qty 2

## 2016-03-31 NOTE — ED Triage Notes (Signed)
Pt states recently released from hospital for gastric bypass Thursday. Pt states coughed hard, has L side and back pain. Pt states seen by [facility] yesterday for pain management. Pt returning today for recurrent pain. Surgeon is out of town.

## 2016-03-31 NOTE — ED Provider Notes (Signed)
[facility] DEPT Provider Note   CSN: [id number] Arrival date & time: 03/30/16  2312  By signing my name below, I, Dolores Hoose, attest that this documentation has been prepared under the direction and in the presence of Lacretia Leigh, MD . Electronically Signed: Dolores Hoose, Scribe. 03/31/2016. 12:51 AM.  History   Chief Complaint Chief Complaint  Patient presents with  . Back Pain   The history is provided by the patient. No language interpreter was used.    HPI Comments:  [name] is a 50 y.o. female who presents to the Emergency Department complaining of sudden-onset worsening intermittent left-sided flank pain beginning 2 hours ago. She describes her pain as sharp, exacerbated by movement or laying on her left side. Pt was released from hospital 4 days ago after a gastric bypass surgery. She states that she was fine when she was eating earlier but she choked on some food and her symptoms began and rapidly worsened. Pt also endorses lower back pain, radiating down her right leg, but states this is a baseline chronic problem for her. She denies any vomiting or fever.   Past Medical History:  Diagnosis Date  . Abdominal pain   . Accidental drug overdose April 2013  . Anxiety   . Atrial fibrillation ([place]) 09/29/11   converted spontaneously  . Chronic back pain   . Chronic knee pain   . Chronic nausea   . Chronic pain   . COPD (chronic obstructive pulmonary disease) ([place])   . Depression   . Diabetes mellitus    states her doctor took her off all DM meds in past month  . Diabetic neuropathy ([place])   . GERD (gastroesophageal reflux disease)   . Headache(784.0)    migraines   . HTN (hypertension)   . Hyperlipidemia   . Hypothyroidism   . Mental disorder    Bipolar and schizophrenic  . Requires supplemental oxygen    as needed per patient   . Schizophrenia ([place])   . Tobacco abuse     Patient Active Problem List   Diagnosis Date Noted  . Morbid obesity ([place])  03/25/2016  . Type 2 diabetes mellitus with diabetic polyneuropathy, with long-term current use of insulin ([place]) 09/06/2015  . Vaginitis and vulvovaginitis, unspecified 04/19/2013  . BV (bacterial vaginosis) 04/19/2013  . HTN (hypertension) 11/04/2011  . Hypokalemia 10/02/2011  . atrial fibrillation 09/30/2011  . Acute respiratory failure ([place]) 09/29/2011  . COPD (chronic obstructive pulmonary disease) ([place]) 09/29/2011  . Accidental drug overdose 09/29/2011    Past Surgical History:  Procedure Laterality Date  . ABDOMINAL HYSTERECTOMY    . BLADDER SUSPENSION  03/04/2011   Procedure: [facility] PROCEDURE;  Surgeon: Elayne Snare MacDiarmid;  Location: [facility];  Service: Urology;  Laterality: N/A;  . CYSTOCELE REPAIR  03/04/2011   Procedure: ANTERIOR REPAIR (CYSTOCELE);  Surgeon: Reece Packer;  Location: [facility];  Service: Urology;  Laterality: N/A;  . CYSTOSCOPY  03/04/2011   Procedure: CYSTOSCOPY;  Surgeon: Elayne Snare MacDiarmid;  Location: [facility];  Service: Urology;  Laterality: N/A;  . GASTRIC ROUX-EN-Y N/A 03/25/2016   Procedure: LAPAROSCOPIC ROUX-EN-Y GASTRIC BYPASS WITH UPPER ENDOSCOPY;  Surgeon: Excell Seltzer, MD;  Location: [facility];  Service: General;  Laterality: N/A;  . KNEE SURGERY    . LAPAROSCOPIC ASSISTED VAGINAL HYSTERECTOMY  03/04/2011   Procedure: LAPAROSCOPIC ASSISTED VAGINAL HYSTERECTOMY;  Surgeon: Cyril Mourning, MD;  Location: [facility];  Service: Gynecology;  Laterality: N/A;    OB History  Gravida Para Term Preterm AB Living   3 3     0 3   SAB TAB Ectopic Multiple Live Births   0 0 0 0         Home Medications    Prior to Admission medications   Medication Sig Start Date End Date Taking? Authorizing Provider  acetaminophen (TYLENOL) 160 MG/5ML liquid Take 10 mLs (320 mg total) by mouth every 4 (four) hours as needed for fever. 03/27/16   Mickeal Skinner, MD  albuterol [facility] HFA) 108 (90 Base) MCG/ACT inhaler Inhale 2 puffs into the lungs every 6 (six)  hours as needed for wheezing or shortness of breath.    Historical Provider, MD  clonazePAM (KLONOPIN) 0.5 MG tablet Take 0.5 mg by mouth 3 (three) times daily as needed for anxiety.  06/30/15   Historical Provider, MD  cycloSPORINE (RESTASIS) 0.05 % ophthalmic emulsion Place 1 drop into both eyes 2 (two) times daily.    Historical Provider, MD  diltiazem (CARDIZEM CD) 240 MG 24 hr capsule Take 240 mg by mouth daily.    Historical Provider, MD  DULoxetine (CYMBALTA) 60 MG capsule Take 60 mg by mouth at bedtime.    Historical Provider, MD  fluorometholone (FML) 0.1 % ophthalmic suspension Place 1 drop into both eyes daily.    Historical Provider, MD  hydrOXYzine (ATARAX/VISTARIL) 50 MG tablet Take 50 mg by mouth 3 (three) times daily as needed for itching.  01/14/16   Historical Provider, MD  Insulin Glargine (TOUJEO SOLOSTAR) 300 UNIT/ML SOPN Inject 40 Units into the skin daily after breakfast. 03/27/16   Mickeal Skinner, MD  levothyroxine (SYNTHROID, LEVOTHROID) 50 MCG tablet Take 50 mcg by mouth daily. 10/10/14   Historical Provider, MD  Lurasidone HCl (LATUDA) 60 MG TABS Take 60 mg by mouth at bedtime.    Historical Provider, MD  metFORMIN (GLUCOPHAGE) 500 MG tablet Take 500 mg by mouth 2 (two) times daily with a meal.    Historical Provider, MD  methocarbamol (ROBAXIN) 500 MG tablet Take 500 mg by mouth 2 (two) times daily as needed for muscle spasms.  03/11/16   Historical Provider, MD  ondansetron (ZOFRAN) 4 MG tablet Take 4 mg by mouth every 6 (six) hours as needed for nausea or vomiting.  03/12/16   Historical Provider, MD  oxymorphone (OPANA ER) 40 MG 12 hr tablet Take 40 mg by mouth every 8 (eight) hours. scheduled    Historical Provider, MD  pantoprazole (PROTONIX) 40 MG tablet Take 40 mg by mouth daily. 01/14/16   Historical Provider, MD  Polyvinyl Alcohol-Povidone (REFRESH OP) Place 1 drop into both eyes 2 (two) times daily as needed (dry eyes).    Historical Provider, MD  pregabalin  (LYRICA) 75 MG capsule Take 150 mg by mouth 2 (two) times daily.    Historical Provider, MD  simvastatin (ZOCOR) 20 MG tablet Take 20 mg by mouth daily. 02/08/15   Historical Provider, MD  SUMAtriptan 6 MG/0.5ML SOAJ Inject 6 mg into the muscle daily as needed (migraines).  06/09/15   Historical Provider, MD  traZODone (DESYREL) 100 MG tablet Take 100 mg by mouth at bedtime.     Robbie Lis, MD    Family History Family History  Problem Relation Age of Onset  . Heart attack Father     62s  . Diabetes Mother   . Heart disease Mother   . Hypertension Mother   . Heart attack Sister     85  .  COPD Other     Social History Social History  Substance Use Topics  . Smoking status: Former Smoker    Quit date: 07/24/2013  . Smokeless tobacco: Never Used  . Alcohol use No     Allergies   Morphine and related and Aspirin   Review of Systems Review of Systems  Constitutional: Negative for fever.  Gastrointestinal: Negative for vomiting.  Genitourinary: Positive for flank pain.  Musculoskeletal: Positive for back pain.  All other systems reviewed and are negative.    Physical Exam Updated Vital Signs BP 108/72   Pulse 81   Temp 98.2 F (36.8 C) (Oral)   Resp 20   Ht 5\' 1"  (1.549 m)   Wt 213 lb (96.6 kg)   LMP 01/08/2011   SpO2 95%   BMI 40.25 kg/m   Physical Exam  Constitutional: She is oriented to person, place, and time. She appears well-developed and well-nourished.  Non-toxic appearance. No distress.  HENT:  Head: Normocephalic and atraumatic.  Eyes: Conjunctivae, EOM and lids are normal. Pupils are equal, round, and reactive to light.  Neck: Normal range of motion. Neck supple. No tracheal deviation present. No thyroid mass present.  Cardiovascular: Normal rate, regular rhythm and normal heart sounds.  Exam reveals no gallop.   No murmur heard. Pulmonary/Chest: Effort normal and breath sounds normal. No stridor. No respiratory distress. She has no decreased  breath sounds. She has no wheezes. She has no rhonchi. She has no rales.  Abdominal: Soft. Normal appearance and bowel sounds are normal. She exhibits no distension. There is no tenderness. There is no rebound and no CVA tenderness.   Multiple laparoscopic incisions intact. Tenderness in the LUQ without guarding or rebound. Left lower lumbar paraspinal tenderness.   Musculoskeletal: Normal range of motion. She exhibits no edema or tenderness.  Neurological: She is alert and oriented to person, place, and time. She has normal strength. No cranial nerve deficit or sensory deficit. GCS eye subscore is 4. GCS verbal subscore is 5. GCS motor subscore is 6.  Skin: Skin is warm and dry. No abrasion and no rash noted.  Psychiatric: She has a normal mood and affect. Her speech is normal and behavior is normal.  Nursing note and vitals reviewed.    ED Treatments / Results  DIAGNOSTIC STUDIES:  Oxygen Saturation is 95% on RA, adequate by my interpretation.    COORDINATION OF CARE:  12:37 AM Discussed treatment plan with pt at bedside which included urinalysis and labwork and pt agreed to plan.  Labs (all labs ordered are listed, but only abnormal results are displayed) Labs Reviewed - No data to display  EKG  EKG Interpretation None       Radiology No results found.  Procedures Procedures (including critical care time)  Medications Ordered in ED Medications - No data to display   Initial Impression / Assessment and Plan / ED Course  I have reviewed the triage vital signs and the nursing notes.  Pertinent labs & imaging results that were available during my care of the patient were reviewed by me and considered in my medical decision making (see chart for details).  Clinical Course    Patient medicated here feels better. Suspect that she has postoperative pain that was exacerbated by her coughing. No acute abdomen signs. Stable for discharge and return precautions given  Final  Clinical Impressions(s) / ED Diagnoses   Final diagnoses:  None    New Prescriptions New Prescriptions   No medications on  file  I personally performed the services described in this documentation, which was scribed in my presence. The recorded information has been reviewed and is accurate.       Lacretia Leigh, MD 03/31/16 [phone]

## 2016-03-31 NOTE — ED Provider Notes (Signed)
[facility] DEPT Provider Note   CSN: [id number] Arrival date & time: 03/31/16  1515     History   Chief Complaint Chief Complaint  Patient presents with  . Post-op Problem  . Abdominal Pain    HPI [name] is a 50 y.o. female.  HPI  10/3 had gastric bypass, 10/5 went home Left upper back and left abdominal pain, severe pain Coughing episode 2 days ago and had severe worsening of pain Sharp pain, come and go, notice it more with moving/turning a certain way If in a certain spot it will be less bad No fevers No urinary symptoms Nausea, no vomiting Diarrhea, yesterday stopped. No passing gas. [place] MD did surgery and has been out of town. Called office this AM. Martin Majestic home on oxycodone in liquid form.  Ran out of it.  Didn't feel pain was controlled on medicine anyway.  Liquid diet now, able to tolerate it. When water goes down, cramps up.   Past Medical History:  Diagnosis Date  . Abdominal pain   . Accidental drug overdose April 2013  . Anxiety   . Atrial fibrillation ([place]) 09/29/11   converted spontaneously  . Chronic back pain   . Chronic knee pain   . Chronic nausea   . Chronic pain   . COPD (chronic obstructive pulmonary disease) ([place])   . Depression   . Diabetes mellitus    states her doctor took her off all DM meds in past month  . Diabetic neuropathy ([place])   . GERD (gastroesophageal reflux disease)   . Headache(784.0)    migraines   . HTN (hypertension)   . Hyperlipidemia   . Hypothyroidism   . Mental disorder    Bipolar and schizophrenic  . Requires supplemental oxygen    as needed per patient   . Schizophrenia ([place])   . Tobacco abuse     Patient Active Problem List   Diagnosis Date Noted  . Morbid obesity ([place]) 03/25/2016  . Type 2 diabetes mellitus with diabetic polyneuropathy, with long-term current use of insulin ([place]) 09/06/2015  . Vaginitis and vulvovaginitis, unspecified 04/19/2013  . BV (bacterial vaginosis) 04/19/2013  .  HTN (hypertension) 11/04/2011  . Hypokalemia 10/02/2011  . atrial fibrillation 09/30/2011  . Acute respiratory failure ([place]) 09/29/2011  . COPD (chronic obstructive pulmonary disease) ([place]) 09/29/2011  . Accidental drug overdose 09/29/2011    Past Surgical History:  Procedure Laterality Date  . ABDOMINAL HYSTERECTOMY    . BLADDER SUSPENSION  03/04/2011   Procedure: [facility] PROCEDURE;  Surgeon: Elayne Snare MacDiarmid;  Location: [facility];  Service: Urology;  Laterality: N/A;  . CYSTOCELE REPAIR  03/04/2011   Procedure: ANTERIOR REPAIR (CYSTOCELE);  Surgeon: Reece Packer;  Location: [facility];  Service: Urology;  Laterality: N/A;  . CYSTOSCOPY  03/04/2011   Procedure: CYSTOSCOPY;  Surgeon: Elayne Snare MacDiarmid;  Location: [facility];  Service: Urology;  Laterality: N/A;  . GASTRIC ROUX-EN-Y N/A 03/25/2016   Procedure: LAPAROSCOPIC ROUX-EN-Y GASTRIC BYPASS WITH UPPER ENDOSCOPY;  Surgeon: Excell Seltzer, MD;  Location: [facility];  Service: General;  Laterality: N/A;  . KNEE SURGERY    . LAPAROSCOPIC ASSISTED VAGINAL HYSTERECTOMY  03/04/2011   Procedure: LAPAROSCOPIC ASSISTED VAGINAL HYSTERECTOMY;  Surgeon: Cyril Mourning, MD;  Location: [facility];  Service: Gynecology;  Laterality: N/A;    OB History    Gravida Para Term Preterm AB Living   3 3     0 3   SAB TAB Ectopic Multiple Live Births  0 0 0 0         Home Medications    Prior to Admission medications   Medication Sig Start Date End Date Taking? Authorizing Provider  albuterol (PROAIR HFA) 108 (90 Base) MCG/ACT inhaler Inhale 2 puffs into the lungs every 6 (six) hours as needed for wheezing or shortness of breath.   Yes Historical Provider, MD  clonazePAM (KLONOPIN) 0.5 MG tablet Take 0.5 mg by mouth 3 (three) times daily as needed for anxiety.  06/30/15  Yes Historical Provider, MD  cycloSPORINE (RESTASIS) 0.05 % ophthalmic emulsion Place 1 drop into both eyes 2 (two) times daily.   Yes Historical Provider, MD  diclofenac sodium (VOLTAREN)  1 % GEL Apply 1 application topically 3 (three) times daily. (Knees, back, shoulders, and back) 03/13/16  Yes Historical Provider, MD  diltiazem (CARDIZEM CD) 240 MG 24 hr capsule Take 240 mg by mouth daily.   Yes Historical Provider, MD  DULoxetine (CYMBALTA) 60 MG capsule Take 60 mg by mouth at bedtime.   Yes Historical Provider, MD  hydrOXYzine (ATARAX/VISTARIL) 50 MG tablet Take 50 mg by mouth 3 (three) times daily as needed for itching.  01/14/16  Yes Historical Provider, MD  ibuprofen (ADVIL,MOTRIN) 800 MG tablet Take 800 mg by mouth every 8 (eight) hours as needed for headache (or pain).  02/18/16  Yes Historical Provider, MD  Insulin Glargine (TOUJEO SOLOSTAR) 300 UNIT/ML SOPN Inject 40 Units into the skin daily after breakfast. Patient taking differently: Inject 190 Units into the skin daily after breakfast.  03/27/16  Yes Arta Bruce Kinsinger, MD  levothyroxine (SYNTHROID, LEVOTHROID) 50 MCG tablet Take 50 mcg by mouth daily. 10/10/14  Yes Historical Provider, MD  Lurasidone HCl (LATUDA) 60 MG TABS Take 60 mg by mouth at bedtime.   Yes Historical Provider, MD  methocarbamol (ROBAXIN) 500 MG tablet Take 500 mg by mouth 2 (two) times daily as needed for muscle spasms.  03/11/16  Yes Historical Provider, MD  oxyCODONE-acetaminophen (PERCOCET) 10-325 MG tablet Take 1 tablet by mouth every 6 (six) hours as needed for pain. 03/10/16 04/09/16 Yes Historical Provider, MD  Polyvinyl Alcohol-Povidone (REFRESH OP) Place 1 drop into both eyes 2 (two) times daily as needed (dry eyes).   Yes Historical Provider, MD  pregabalin (LYRICA) 75 MG capsule Take 150 mg by mouth 2 (two) times daily.   Yes Historical Provider, MD  simethicone (MYLICON) 80 MG chewable tablet Chew 80-160 mg by mouth 2 (two) times daily as needed for flatulence.   Yes Historical Provider, MD  simvastatin (ZOCOR) 20 MG tablet Take 20 mg by mouth daily. 02/08/15  Yes Historical Provider, MD  SUMAtriptan 6 MG/0.5ML SOAJ Inject 6 mg into the  muscle daily as needed (migraines).  06/09/15  Yes Historical Provider, MD  traZODone (DESYREL) 100 MG tablet Take 100 mg by mouth at bedtime as needed for sleep.    Yes Robbie Lis, MD  acetaminophen (TYLENOL) 160 MG/5ML liquid Take 10 mLs (320 mg total) by mouth every 4 (four) hours as needed for fever. Patient not taking: Reported on 03/31/2016 03/27/16   Arta Bruce Kinsinger, MD  fluorometholone (FML) 0.1 % ophthalmic suspension Place 1 drop into both eyes daily.    Historical Provider, MD  lidocaine (LIDODERM) 5 % Place 1 patch onto the skin daily as needed. 12 hours on/12 hours off 03/04/16   Historical Provider, MD  ondansetron (ZOFRAN) 4 MG tablet Take 4 mg by mouth every 6 (six) hours as needed for nausea  or vomiting.  03/12/16   Historical Provider, MD  pantoprazole (PROTONIX) 40 MG tablet Take 40 mg by mouth daily. 01/14/16   Historical Provider, MD    Family History Family History  Problem Relation Age of Onset  . Heart attack Father     74s  . Diabetes Mother   . Heart disease Mother   . Hypertension Mother   . Heart attack Sister     63  . COPD Other     Social History Social History  Substance Use Topics  . Smoking status: Former Smoker    Quit date: 07/24/2013  . Smokeless tobacco: Never Used  . Alcohol use No     Allergies   Morphine and related and Aspirin   Review of Systems Review of Systems  Constitutional: Negative for fever.  HENT: Negative for sore throat.   Eyes: Negative for visual disturbance.  Respiratory: Negative for cough and shortness of breath.   Cardiovascular: Negative for chest pain.  Gastrointestinal: Positive for abdominal pain and nausea. Negative for diarrhea (did have now improved) and vomiting.  Genitourinary: Negative for difficulty urinating and dysuria.  Musculoskeletal: Negative for back pain and neck pain.  Skin: Negative for rash.  Neurological: Negative for syncope and headaches.     Physical Exam Updated Vital Signs BP  124/88   Pulse 85   Temp 98.2 F (36.8 C) (Oral)   Resp 18   LMP 01/08/2011   SpO2 100%   Physical Exam  Constitutional: She is oriented to person, place, and time. She appears well-developed and well-nourished. No distress.  HENT:  Head: Normocephalic and atraumatic.  Eyes: Conjunctivae and EOM are normal.  Neck: Normal range of motion.  Cardiovascular: Normal rate, regular rhythm, normal heart sounds and intact distal pulses.  Exam reveals no gallop and no friction rub.   No murmur heard. Pulmonary/Chest: Effort normal and breath sounds normal. No respiratory distress. She has no wheezes. She has no rales.  Abdominal: Soft. She exhibits no distension. There is tenderness in the epigastric area and left upper quadrant. There is guarding and CVA tenderness.  Incisions C/D/I  Musculoskeletal: She exhibits no edema or tenderness.  Neurological: She is alert and oriented to person, place, and time.  Skin: Skin is warm and dry. No rash noted. She is not diaphoretic. No erythema.  Nursing note and vitals reviewed.    ED Treatments / Results  Labs (all labs ordered are listed, but only abnormal results are displayed) Labs Reviewed  COMPREHENSIVE METABOLIC PANEL - Abnormal; Notable for the following:       Result Value   Glucose, Bld 143 (*)    Total Protein 6.2 (*)    All other components within normal limits  CBC WITH DIFFERENTIAL/PLATELET  LIPASE, BLOOD  URINALYSIS, ROUTINE W REFLEX MICROSCOPIC (NOT AT [facility])  I-STAT CG4 LACTIC ACID, ED    EKG  EKG Interpretation None       Radiology Ct Abdomen Pelvis W Contrast  Result Date: 03/31/2016 CLINICAL DATA:  Abdominal pain, nausea and vomiting. Gastric bypass six days prior. EXAM: CT ABDOMEN AND PELVIS WITH CONTRAST TECHNIQUE: Multidetector CT imaging of the abdomen and pelvis was performed using the standard protocol following bolus administration of intravenous contrast. CONTRAST:  118mL ISOVUE-300 IOPAMIDOL (ISOVUE-300)  INJECTION 61% COMPARISON:  Radiographs earlier this day.  CT 01/17/2016 FINDINGS: Lower chest: Minimal right lower lobe and lingular atelectasis. No pleural fluid. Hepatobiliary: Subcentimeter subcapsular hypodensity in the right lobe of the liver is unchanged. Physiologically  distended gallbladder without calcified stone. No biliary dilatation. Pancreas: No ductal dilatation or inflammation. Spleen: Normal in size without focal abnormality. Adrenals/Urinary Tract: Adrenal glands are unremarkable. Kidneys are normal, without renal calculi, focal lesion, or hydronephrosis. Bladder is unremarkable. Stomach/Bowel: Small hiatal hernia. Post gastric bypass. The excluded gastric remnant is decompressed. No evidence of peri-anastomotic abscess. Contrast opacifies the Roux limb and normal jejunal bowel loops. No dilated or thickened bowel loops. No evidence of obstruction. Small to moderate stool burden throughout the colon. No colonic inflammation. The appendix is normal. Vascular/Lymphatic: Aortic atherosclerosis. No enlarged abdominal or pelvic lymph nodes. Reproductive: Status post hysterectomy. No adnexal masses. Other: Small amount of free fluid in the pelvis, likely postsurgical. No intra-abdominal abscess. No free air. Fat within both inguinal canals. Minimal air in the subcutaneous tissues of the left lateral abdominal wall likely related to recent surgery. No subcutaneous abscess. Musculoskeletal: Sclerosis about the right sacroiliac joint is stable from prior exam. Scattered degenerative change in the spine, also unchanged. There are no acute or suspicious osseous abnormalities. IMPRESSION: 1. Post gastric bypass without complication or acute abnormality. 2. Small hiatal hernia. Electronically Signed   By: Jeb Levering M.D.   On: 03/31/2016 23:02   Dg Abd Acute W/chest  Result Date: 03/31/2016 CLINICAL DATA:  Left upper abdominal pain EXAM: DG ABDOMEN ACUTE W/ 1V CHEST COMPARISON:  CT abdomen pelvis  01/17/2016, gastric leak study 03/26/2016 FINDINGS: The lungs are well inflated without focal airspace consolidation or pulmonary edema. Cardiomediastinal contours are normal. No pleural effusion or pneumothorax. There is no free intraperitoneal air within the abdomen. No dilated loops of small bowel are identified. Suture lines noted at the site of gastric bypass. No mass lesion of the abdomen is seen. IMPRESSION: No radiographic evidence of obstruction or free intraperitoneal gas. No acute cardiopulmonary disease. Electronically Signed   By: Ulyses Jarred M.D.   On: 03/31/2016 02:09    Procedures Procedures (including critical care time)  Medications Ordered in ED Medications  sodium chloride 0.9 % bolus 1,000 mL (0 mLs Intravenous Stopped 04/01/16 0023)  HYDROmorphone (DILAUDID) injection 1 mg (1 mg Intravenous Given 03/31/16 2003)  iopamidol (ISOVUE-300) 61 % injection (100 mLs  Contrast Given 03/31/16 2205)  HYDROcodone-acetaminophen (HYCET) 7.5-325 mg/15 ml solution 10 mL (10 mLs Oral Given 04/01/16 0030)     Initial Impression / Assessment and Plan / ED Course  I have reviewed the triage vital signs and the nursing notes.  Pertinent labs & imaging results that were available during my care of the patient were reviewed by me and considered in my medical decision making (see chart for details).  Clinical Course   50 year old female with a history of COPD, diabetes, depression, chronic back pain, hypertension, hyperlipidemia, recent gastric bypass surgery October 3 by Dr. Excell Seltzer, presents with concern for left upper quadrant and left flank pain. Labs show no acute changes. CT abdomen shows no sign of obstruction, no signs of internal hernia. Patient does not have symptoms to suggest anastomotic leak, or anastamotic ulcer.  Given duration of symptoms beginning on Saturday, as well as normal vital signs, normal CT and labs feel other occult application of her gastric bypass surgery is  unlikely. Discussed that we do not refill narcotic prescriptions from the emergency department after surgery, and recommend this is done through her surgery office. Patient states understanding of this. Reviewed her in the narcotic database, it appears that her medication for postoperatively was filled on September 20. Recommend she call her surgery  clinic tomorrow for close follow-up as well as assistance with pain control. Patient discharged in stable condition with understanding of reasons to return.   Final Clinical Impressions(s) / ED Diagnoses   Final diagnoses:  Postoperative abdominal pain    New Prescriptions Discharge Medication List as of 04/01/2016 12:12 AM       Gareth Morgan, MD 04/01/16 [id number]

## 2016-03-31 NOTE — ED Notes (Signed)
Patient taken to CT.

## 2016-03-31 NOTE — ED Notes (Signed)
Patient returned from CT

## 2016-04-01 DIAGNOSIS — R1012 Left upper quadrant pain: Secondary | ICD-10-CM | POA: Diagnosis not present

## 2016-04-01 MED ORDER — HYDROCODONE-ACETAMINOPHEN 7.5-325 MG/15ML PO SOLN
10.0000 mL | Freq: Once | ORAL | Status: AC
  Administered 2016-04-01: 10 mL via ORAL
  Filled 2016-04-01: qty 15

## 2016-04-01 MED FILL — oxyCODONE HCL 5 MG/5ML SOLN: 5 | 3 days supply | Qty: 200 | Fill #0

## 2016-04-01 NOTE — ED Notes (Signed)
Patient Alert and oriented X4. Stable and ambulatory. Patient verbalized understanding of the discharge instructions.  Patient belongings were taken by the patient.  

## 2016-04-08 ENCOUNTER — Encounter: Payer: Medicare Other | Attending: General Surgery | Admitting: Skilled Nursing Facility1

## 2016-04-08 DIAGNOSIS — E1142 Type 2 diabetes mellitus with diabetic polyneuropathy: Secondary | ICD-10-CM | POA: Diagnosis present

## 2016-04-08 DIAGNOSIS — Z6841 Body Mass Index (BMI) 40.0 and over, adult: Secondary | ICD-10-CM | POA: Insufficient documentation

## 2016-04-08 DIAGNOSIS — I10 Essential (primary) hypertension: Secondary | ICD-10-CM | POA: Diagnosis present

## 2016-04-08 NOTE — Patient Instructions (Addendum)
-  Call Dr. Loanne Drilling and tell him you are worried about your insulin regimen and that you are having lows  -If you think your blood sugar is low check it-Below 70 is low   -Protein for breakfast then 3 hours later half a shake then 3 hours later Protein then 3 hours later half a shake then 3 hours later Protein   -If you wake up in the middle of the night only consume water  -Do not go anywhere without water in your hand (unless you are eating)

## 2016-04-08 NOTE — Progress Notes (Signed)
Follow-up visit:  2 Weeks Post-Operative sleeve gastrectomy Surgery  Medical Nutrition Therapy:  Appt start time: 1:58 end time:  2:05  Primary concerns today: Post-operative Bariatric Surgery Nutrition Management. Pt states "I think I misunderstood the surgery", Pt states she doesn't understand her stomach now, Pt states she has a bone to pick because she thought she would be off the insulin by now. Pt states she is Very hungry.Pt states she is still on the same insulin regimen. Pt states her blood sugar is still in the 200's, still checking blood sugar everyday, pt states she was told recently she has been taking her medicine incorrectly and taking her simvastatin at night-"no body ever told me that" Pt states she has been taken off the metformin. Pt states she checks her blood sugars every morning but could not say any numbers after several attempts of asking. Pt states she sees her endocrinologist next week. Pt states she has not been following what laurie wrote on her discharge sheet. Pt states she has only had 2 bowel movements since surgery. Pt states it feels like water has been balling up in her stomach. Pt states she has been to the hospital twice since leaving the hospital and was told she had air in her stomach and was told she is dehydrated. Pt states water now has an aftertaste. Pt states laurie told her to stop taking certain medications but she cannot recall what they were. Pt states she is sore around the side of her incision and her back is in a lot of pain started after she coughed hard. Pt arrived with heavy use of her cane and less mobility than she has had in previous appointments.   Pt states she is disappointed because she thought she would not need the insulin anymore and that's she thought she would lose more wt.  Preferred Learning Style:   No preference indicated   Learning Readiness:   Ready  24-hr recall: B (AM): [place] Yogurt (15 grams protein)----strained cream of  chicken soup mixed with chicken noodle soup broth (didnt finish) Snk (AM): the rest of the soup and half premier protein (15 grams protein) L (PM): greek yogurt (15 grams protein) Snk (PM):  D (PM): strained soup Snk (2 am): other half of premier protein shake 915 grams of premier protein)  Fluid intake: maybe 40 ounces Estimated total protein intake: 45  Medications: Off metformin before the surgery Supplementation: every day: calcium chew 3 times a day, 2 multivitamin one in the morning and   CBG monitoring: every morning before she eats (249, 150,268) Average CBG per patient: 200's Last patient reported A1c: 6.something  Goals: -Call Dr. Loanne Drilling and tell him you are worried about your insulin regimen and that you are having lows  -If you think your blood sugar is low check it-Below 70 is low   -Protein for breakfast then 3 hours later half a shake then 3 hours later Protein then 3 hours later half a shake then 3 hours later Protein   -If you wake up in the middle of the night only consume water  -Do not go anywhere without water in your hand (unless you are eating) Surgery date: March 25, 2016 Surgery type: RYGB [facility] weight at [facility]: 219 lbs on 09/10/2015 Weight today: 195.4 lbs Lost since last visit:11.6  Using straws: No Drinking while eating: No Hair loss: No Carbonated beverages: No N/V/D/C: Little bit/no/no/yes Dumping syndrome: No  Recent physical activity:  None  Progress Towards Goal(s):  In progress.  Handouts given during visit include:  Post op 3A     Monitoring/Evaluation:  Dietary intake, exercise, lap band fills, and body weight. Follow up in 1 months for 6 week post-op visit.    TANITA  BODY COMP RESULTS 02/29/2016  02/04/16 04/08/2016  39.1  BMI (kg/m^2) 39.1 36.9  99.01  Fat Mass (lbs) 99.8 88.6  108.01  Fat Free Mass (lbs) 107.2 106.8  77.8  Total Body Water (lbs) 77.2 76.4

## 2016-04-09 ENCOUNTER — Encounter: Payer: Self-pay | Admitting: Endocrinology

## 2016-04-09 ENCOUNTER — Ambulatory Visit (INDEPENDENT_AMBULATORY_CARE_PROVIDER_SITE_OTHER): Payer: Medicare Other | Admitting: Endocrinology

## 2016-04-09 VITALS — BP 122/78 | HR 91 | Wt 199.0 lb

## 2016-04-09 DIAGNOSIS — Z794 Long term (current) use of insulin: Secondary | ICD-10-CM

## 2016-04-09 DIAGNOSIS — E1142 Type 2 diabetes mellitus with diabetic polyneuropathy: Secondary | ICD-10-CM

## 2016-04-09 MED ORDER — INSULIN GLARGINE 300 UNIT/ML ~~LOC~~ SOPN: 100.0000 [IU] | PEN_INJECTOR | Freq: Every day | SUBCUTANEOUS | 1 refills | Status: DC

## 2016-04-09 NOTE — Progress Notes (Signed)
Subjective:    Patient ID: [name], female    DOB: 1965/11/22, 50 y.o.   MRN: [id number]  HPI Pt returns for f/u of diabetes mellitus: DM type: Insulin-requiring type 2 Dx'ed: [id number] Complications: polyneuropathy.    Therapy: insulin since 2013 GDM: never.   DKA: never.  Severe hypoglycemia: never.  Pancreatitis: never.  Other: she takes QD insulin, due to forgetting more frequent doses.   Interval history:  She had bariatric surgery 2 weeks ago.  She has lost 9 lbs so far.  She takes toujeo, 180 units qam.  She is having frequent mild hypoglycemia.   Past Medical History:  Diagnosis Date  . Abdominal pain   . Accidental drug overdose April 2013  . Anxiety   . Atrial fibrillation ([place]) 09/29/11   converted spontaneously  . Chronic back pain   . Chronic knee pain   . Chronic nausea   . Chronic pain   . COPD (chronic obstructive pulmonary disease) ([place])   . Depression   . Diabetes mellitus    states her doctor took her off all DM meds in past month  . Diabetic neuropathy ([place])   . GERD (gastroesophageal reflux disease)   . Headache(784.0)    migraines   . HTN (hypertension)   . Hyperlipidemia   . Hypothyroidism   . Mental disorder    Bipolar and schizophrenic  . Requires supplemental oxygen    as needed per patient   . Schizophrenia ([place])   . Tobacco abuse     Past Surgical History:  Procedure Laterality Date  . ABDOMINAL HYSTERECTOMY    . BLADDER SUSPENSION  03/04/2011   Procedure: [facility] PROCEDURE;  Surgeon: Elayne Snare MacDiarmid;  Location: [place] [facility];  Service: Urology;  Laterality: N/A;  . CYSTOCELE REPAIR  03/04/2011   Procedure: ANTERIOR REPAIR (CYSTOCELE);  Surgeon: Reece Packer;  Location: [facility];  Service: Urology;  Laterality: N/A;  . CYSTOSCOPY  03/04/2011   Procedure: CYSTOSCOPY;  Surgeon: Elayne Snare MacDiarmid;  Location: [facility];  Service: Urology;  Laterality: N/A;  . GASTRIC ROUX-EN-Y N/A 03/25/2016   Procedure: LAPAROSCOPIC ROUX-EN-Y GASTRIC BYPASS  WITH UPPER ENDOSCOPY;  Surgeon: Excell Seltzer, MD;  Location: [facility];  Service: General;  Laterality: N/A;  . KNEE SURGERY    . LAPAROSCOPIC ASSISTED VAGINAL HYSTERECTOMY  03/04/2011   Procedure: LAPAROSCOPIC ASSISTED VAGINAL HYSTERECTOMY;  Surgeon: Cyril Mourning, MD;  Location: [facility] [place] [facility];  Service: Gynecology;  Laterality: N/A;    Social History   Social History  . Marital status: Legally Separated    Spouse name: N/A  . Number of children: N/A  . Years of education: N/A   Occupational History  . Not on file.   Social History Main Topics  . Smoking status: Former Smoker    Quit date: 07/24/2013  . Smokeless tobacco: Never Used  . Alcohol use No  . Drug use: No  . Sexual activity: Yes    Partners: Male   Other Topics Concern  . Not on file   Social History Narrative  . No narrative on file    Current Outpatient Prescriptions on File Prior to Visit  Medication Sig Dispense Refill  . albuterol (PROAIR HFA) 108 (90 Base) MCG/ACT inhaler Inhale 2 puffs into the lungs every 6 (six) hours as needed for wheezing or shortness of breath.    . clonazePAM (KLONOPIN) 0.5 MG tablet Take 0.5 mg by mouth 3 (three) times daily as needed for anxiety.     Marland Kitchen  cycloSPORINE (RESTASIS) 0.05 % ophthalmic emulsion Place 1 drop into both eyes 2 (two) times daily.    . diclofenac sodium (VOLTAREN) 1 % GEL Apply 1 application topically 3 (three) times daily. (Knees, back, shoulders, and back)  5  . diltiazem (CARDIZEM CD) 240 MG 24 hr capsule Take 240 mg by mouth daily.    . DULoxetine (CYMBALTA) 60 MG capsule Take 60 mg by mouth at bedtime.    . fluorometholone (FML) 0.1 % ophthalmic suspension Place 1 drop into both eyes daily.    . hydrOXYzine (ATARAX/VISTARIL) 50 MG tablet Take 50 mg by mouth 3 (three) times daily as needed for itching.     Marland Kitchen ibuprofen (ADVIL,MOTRIN) 800 MG tablet Take 800 mg by mouth every 8 (eight) hours as needed for headache (or pain).     Marland Kitchen levothyroxine (SYNTHROID,  LEVOTHROID) 50 MCG tablet Take 50 mcg by mouth daily.  5  . lidocaine (LIDODERM) 5 % Place 1 patch onto the skin daily as needed. 12 hours on/12 hours off  0  . Lurasidone HCl (LATUDA) 60 MG TABS Take 60 mg by mouth at bedtime.    . methocarbamol (ROBAXIN) 500 MG tablet Take 500 mg by mouth 2 (two) times daily as needed for muscle spasms.   2  . ondansetron (ZOFRAN) 4 MG tablet Take 4 mg by mouth every 6 (six) hours as needed for nausea or vomiting.     . pantoprazole (PROTONIX) 40 MG tablet Take 40 mg by mouth daily.    . Polyvinyl Alcohol-Povidone (REFRESH OP) Place 1 drop into both eyes 2 (two) times daily as needed (dry eyes).    . pregabalin (LYRICA) 75 MG capsule Take 150 mg by mouth 2 (two) times daily.    . simvastatin (ZOCOR) 20 MG tablet Take 20 mg by mouth daily.  5  . SUMAtriptan 6 MG/0.5ML SOAJ Inject 6 mg into the muscle daily as needed (migraines).   5  . traZODone (DESYREL) 100 MG tablet Take 100 mg by mouth at bedtime as needed for sleep.     Marland Kitchen acetaminophen (TYLENOL) 160 MG/5ML liquid Take 10 mLs (320 mg total) by mouth every 4 (four) hours as needed for fever. (Patient not taking: Reported on 04/09/2016) 240 mL 0  . simethicone (MYLICON) 80 MG chewable tablet Chew 80-160 mg by mouth 2 (two) times daily as needed for flatulence.     No current facility-administered medications on file prior to visit.     Allergies  Allergen Reactions  . Morphine And Related Nausea And Vomiting  . Aspirin Nausea And Vomiting    Ok to take tylenol or ibuprofen    Family History  Problem Relation Age of Onset  . Heart attack Father     75s  . Diabetes Mother   . Heart disease Mother   . Hypertension Mother   . Heart attack Sister     66  . COPD Other     BP 122/78   Pulse 91   Wt 199 lb (90.3 kg)   LMP 01/08/2011   SpO2 94%   BMI 37.60 kg/m    Review of Systems Denies LOC    Objective:   Physical Exam VITAL SIGNS:  See vs page GENERAL: no distress Pulses: dorsalis  pedis intact bilat.   MSK: no deformity of the feet CV: no leg edema Skin:  no ulcer on the feet.  normal color and temp on the feet. Neuro: sensation is intact to touch on the feet  (  pt says a1c was 6.3 yesterday).        Assessment & Plan:  Insulin-requiring type 2 DM: overcontrolled, given this regimen, which does match insulin to her changing needs throughout the day.   Obesity, improved after surgery  Patient is advised the following: Patient Instructions  check your blood sugar twice a day.  vary the time of day when you check, between before the 3 meals, and at bedtime.  also check if you have symptoms of your blood sugar being too high or too low.  please keep a record of the readings and bring it to your next appointment here.  You can write it on any piece of paper.  please call us sooner if your blood sugar goes below 70, or if you have a lot of readings over 200. Please decrease the toujeo to 100 units each morning. On this type of insulin schedule, you should eat meals on a regular schedule.  If a meal is missed or significantly delayed, your blood sugar could go low.  Please come back for a follow-up appointment in 1 month.

## 2016-04-09 NOTE — Patient Instructions (Signed)
check your blood sugar twice a day.  vary the time of day when you check, between before the 3 meals, and at bedtime.  also check if you have symptoms of your blood sugar being too high or too low.  please keep a record of the readings and bring it to your next appointment here.  You can write it on any piece of paper.  please call us sooner if your blood sugar goes below 70, or if you have a lot of readings over 200. Please decrease the toujeo to 100 units each morning. On this type of insulin schedule, you should eat meals on a regular schedule.  If a meal is missed or significantly delayed, your blood sugar could go low.  Please come back for a follow-up appointment in 1 month.

## 2016-04-14 ENCOUNTER — Encounter (HOSPITAL_COMMUNITY): Payer: Self-pay

## 2016-04-14 ENCOUNTER — Emergency Department (HOSPITAL_COMMUNITY)
Admission: EM | Admit: 2016-04-14 | Discharge: 2016-04-14 | Disposition: A | Discharge status: Home / Self Care | Payer: Medicare Other | Attending: Emergency Medicine | Admitting: Emergency Medicine

## 2016-04-14 DIAGNOSIS — R197 Diarrhea, unspecified: Secondary | ICD-10-CM | POA: Insufficient documentation

## 2016-04-14 DIAGNOSIS — R110 Nausea: Secondary | ICD-10-CM | POA: Insufficient documentation

## 2016-04-14 DIAGNOSIS — J449 Chronic obstructive pulmonary disease, unspecified: Secondary | ICD-10-CM | POA: Insufficient documentation

## 2016-04-14 DIAGNOSIS — E039 Hypothyroidism, unspecified: Secondary | ICD-10-CM | POA: Insufficient documentation

## 2016-04-14 DIAGNOSIS — Z87891 Personal history of nicotine dependence: Secondary | ICD-10-CM | POA: Insufficient documentation

## 2016-04-14 DIAGNOSIS — I10 Essential (primary) hypertension: Secondary | ICD-10-CM | POA: Insufficient documentation

## 2016-04-14 DIAGNOSIS — E1140 Type 2 diabetes mellitus with diabetic neuropathy, unspecified: Secondary | ICD-10-CM | POA: Diagnosis not present

## 2016-04-14 LAB — I-STAT CHEM 8, ED
BUN: 14 mg/dL (ref 6–20)
Calcium, Ion: 1.2 mmol/L (ref 1.15–1.40)
Chloride: 107 mmol/L (ref 101–111)
Creatinine, Ser: 0.6 mg/dL (ref 0.44–1.00)
GLUCOSE: 153 mg/dL — AB (ref 65–99)
HEMATOCRIT: 39 % (ref 36.0–46.0)
HEMOGLOBIN: 13.3 g/dL (ref 12.0–15.0)
POTASSIUM: 3.9 mmol/L (ref 3.5–5.1)
Sodium: 141 mmol/L (ref 135–145)
TCO2: 24 mmol/L (ref 0–100)

## 2016-04-14 MED ORDER — DICYCLOMINE HCL 10 MG PO CAPS
10.0000 mg | ORAL_CAPSULE | Freq: Once | ORAL | Status: AC
  Administered 2016-04-14: 10 mg via ORAL
  Filled 2016-04-14: qty 1

## 2016-04-14 MED ORDER — PROMETHAZINE HCL 25 MG PO TABS
50.0000 mg | ORAL_TABLET | Freq: Once | ORAL | Status: AC
  Administered 2016-04-14: 50 mg via ORAL
  Filled 2016-04-14: qty 2

## 2016-04-14 MED ORDER — ONDANSETRON 4 MG PO TBDP
4.0000 mg | ORAL_TABLET | Freq: Once | ORAL | Status: AC | PRN
  Administered 2016-04-14: 4 mg via ORAL
  Filled 2016-04-14: qty 1

## 2016-04-14 MED ORDER — PROMETHAZINE HCL 25 MG PO TABS: 25.0000 mg | ORAL_TABLET | Freq: Four times a day (QID) | ORAL | 0 refills | Status: DC | PRN

## 2016-04-14 NOTE — ED Notes (Signed)
Discharge instructions, follow up care, and rx x1 reviewed with patient. Patient verbalized understanding. 

## 2016-04-14 NOTE — ED Notes (Signed)
Pt reports having diarrhea and nausea since 04/10/16 after seeing her PCP on 04/10/16. Pt unable to determine how many episodes of diarrhea she has had in the last 24 hours. Pt reports being able to keep fluids down.

## 2016-04-14 NOTE — ED Triage Notes (Signed)
Patient c/o diarrhea since 10/19.  Patient states that stool started out as loose and has now become watery.  Patient states that she saw red blood in the stool this morning.  Patient also states that she has left sided back pain ad was seen x2 weeks ago here at [facility].  Patient has had nausea but, denies vomiting.

## 2016-04-14 NOTE — Discharge Instructions (Signed)
You were seen in the ED today with diarrhea and nausea. This may be due to your recent surgery. Your labs were normal today. We are starting a new nausea medication and would like for you to try smaller, more frequent meals to see if this improves your diarrhea symptoms. Discuss this plan with your surgery team.   Return to the ED immediately with any sudden, severe abdominal pain, fever, chills, vomiting, or worsening diarrhea.

## 2016-04-14 NOTE — ED Provider Notes (Signed)
Emergency Department Provider Note   I have reviewed the triage vital signs and the nursing notes.   HISTORY  Chief Complaint Diarrhea   HPI [name] is a 50 y.o. female with PMH of COPD, DM, obesity with recent gastric bypass surgery presents to the ED with continued diarrhea and right flank pain. She has had symptoms since her gastric bypass earlier this month. She reports frequent diarrhea that is worse after eating. She has nausea but no vomiting. No fever, chills, CP, or SOB. No sudden severe or different abdominal pain. She has been on a liquid-only diet with no improvement in symptoms. She saw a small amount (single streak) of BRB mixed with stool this evening which prompted the return visit. No diarrhea since arrival in the ED.   Past Medical History:  Diagnosis Date  . Abdominal pain   . Accidental drug overdose April 2013  . Anxiety   . Atrial fibrillation ([place]) 09/29/11   converted spontaneously  . Chronic back pain   . Chronic knee pain   . Chronic nausea   . Chronic pain   . COPD (chronic obstructive pulmonary disease) ([place])   . Depression   . Diabetes mellitus    states her doctor took her off all DM meds in past month  . Diabetic neuropathy ([place])   . GERD (gastroesophageal reflux disease)   . Headache(784.0)    migraines   . HTN (hypertension)   . Hyperlipidemia   . Hypothyroidism   . Mental disorder    Bipolar and schizophrenic  . Requires supplemental oxygen    as needed per patient   . Schizophrenia ([place])   . Tobacco abuse     Patient Active Problem List   Diagnosis Date Noted  . Morbid obesity ([place]) 03/25/2016  . Type 2 diabetes mellitus with diabetic polyneuropathy, with long-term current use of insulin ([place]) 09/06/2015  . Vaginitis and vulvovaginitis, unspecified 04/19/2013  . BV (bacterial vaginosis) 04/19/2013  . HTN (hypertension) 11/04/2011  . Hypokalemia 10/02/2011  . atrial fibrillation 09/30/2011  . Acute respiratory failure  ([place]) 09/29/2011  . COPD (chronic obstructive pulmonary disease) ([place]) 09/29/2011  . Accidental drug overdose 09/29/2011    Past Surgical History:  Procedure Laterality Date  . ABDOMINAL HYSTERECTOMY    . BLADDER SUSPENSION  03/04/2011   Procedure: [facility] PROCEDURE;  Surgeon: Elayne Snare MacDiarmid;  Location: [place] [facility];  Service: Urology;  Laterality: N/A;  . CYSTOCELE REPAIR  03/04/2011   Procedure: ANTERIOR REPAIR (CYSTOCELE);  Surgeon: Reece Packer;  Location: [facility];  Service: Urology;  Laterality: N/A;  . CYSTOSCOPY  03/04/2011   Procedure: CYSTOSCOPY;  Surgeon: Elayne Snare MacDiarmid;  Location: [facility];  Service: Urology;  Laterality: N/A;  . GASTRIC ROUX-EN-Y N/A 03/25/2016   Procedure: LAPAROSCOPIC ROUX-EN-Y GASTRIC BYPASS WITH UPPER ENDOSCOPY;  Surgeon: Excell Seltzer, MD;  Location: [facility];  Service: General;  Laterality: N/A;  . KNEE SURGERY    . LAPAROSCOPIC ASSISTED VAGINAL HYSTERECTOMY  03/04/2011   Procedure: LAPAROSCOPIC ASSISTED VAGINAL HYSTERECTOMY;  Surgeon: Cyril Mourning, MD;  Location: [facility];  Service: Gynecology;  Laterality: N/A;    Current Outpatient Rx  . Order #: [id number] Class: Normal  . Order #: [id number] Class: Historical Med  . Order #: [id number] Class: Historical Med  . Order #: [id number] Class: Historical Med  . Order #: [id number] Class: Historical Med  . Order #: [id number] Class: Historical Med  . Order #: [id number] Class: Historical Med  . Order #: [id number] Class: Historical Med  . Order #:  [id number] Class: Historical Med  . Order #: [id number] Class: Historical Med  . Order #: [id number] Class: Normal  . Order #: [id number] Class: Historical Med  . Order #: [id number] Class: Historical Med  . Order #: [id number] Class: Historical Med  . Order #: [id number] Class: Historical Med  . Order #: [id number] Class: Historical Med  . Order #: [id number] Class: Historical Med  . Order #: [id number] Class: Historical Med  . Order #: [id number] Class: Historical Med  . Order #:  [id number] Class: Print  . Order #: [id number] Class: Historical Med  . Order #: [id number] Class: Historical Med  . Order #: [id number] Class: Historical Med  . Order #: [id number] Class: Historical Med    Allergies Morphine and related and Aspirin  Family History  Problem Relation Age of Onset  . Heart attack Father     62s  . Diabetes Mother   . Heart disease Mother   . Hypertension Mother   . Heart attack Sister     29  . COPD Other     Social History Social History  Substance Use Topics  . Smoking status: Former Smoker    Quit date: 07/24/2013  . Smokeless tobacco: Never Used  . Alcohol use No    Review of Systems  Constitutional: No fever/chills Eyes: No visual changes. ENT: No sore throat. Cardiovascular: Denies chest pain. Respiratory: Denies shortness of breath. Gastrointestinal: No abdominal pain. Positive nausea, no vomiting. Positive diarrhea.  No constipation. Positive right flank pain.  Genitourinary: Negative for dysuria. Musculoskeletal: Negative for back pain. Skin: Negative for rash. Neurological: Negative for headaches, focal weakness or numbness.  10-point ROS otherwise negative.  ____________________________________________   PHYSICAL EXAM:  VITAL SIGNS: ED Triage Vitals  Enc Vitals Group     BP 04/14/16 0417 125/75     Pulse Rate 04/14/16 0417 112     Resp 04/14/16 0417 21     Temp 04/14/16 0417 98.1 F (36.7 C)     Temp Source 04/14/16 0417 Oral     SpO2 04/14/16 0417 93 %     Weight 04/14/16 0418 212 lb (96.2 kg)     Height 04/14/16 0418 5\' 1"  (1.549 m)     Pain Score 04/14/16 0437 10   Constitutional: Alert and oriented. Well appearing and in no acute distress. Eyes: Conjunctivae are normal Head: Atraumatic. Nose: No congestion/rhinnorhea. Mouth/Throat: Mucous membranes are moist.  Oropharynx non-erythematous. Neck: No stridor.   Cardiovascular: Tachycardia. Good peripheral circulation. Grossly normal heart sounds.   Respiratory:  Normal respiratory effort.  No retractions. Lungs CTAB. Gastrointestinal: Soft and nontender. No distention. No perirectal skin breakdown or fissure.  Musculoskeletal: No lower extremity tenderness nor edema. No gross deformities of extremities. Neurologic:  Normal speech and language. No gross focal neurologic deficits are appreciated.  Skin:  Skin is warm, dry and intact. No rash noted.  ____________________________________________   LABS (all labs ordered are listed, but only abnormal results are displayed)  Labs Reviewed  I-STAT CHEM 8, ED - Abnormal; Notable for the following:       Result Value   Glucose, Bld 153 (*)    All other components within normal limits   ____________________________________________  RADIOLOGY  None ____________________________________________   PROCEDURES  Procedure(s) performed:   Procedures  None ____________________________________________   INITIAL IMPRESSION / ASSESSMENT AND PLAN / ED COURSE  Pertinent labs & imaging results that were available during my care of the patient were reviewed by me and considered in my medical decision making (see chart for details).  Patient presents to the ED for evaluation  of right flank pain and diarrhea that have been ongoing since her gastric bypass earlier this month. No fever. Abdomen is soft and non-tender to palpation. No CVA tenderness. No obvious abnormality on rectal exam. Patient's initial tachycardia resolved without intervention. Plan for chemistry, H/H, and reassessment after Phenergan, Bentyl, and PO challenge.   07:25 AM Patient with significantly improved nausea and now mild abdominal pain. She has pain medication at home. No diarrhea or vomiting here. No significant AKI or electrolyte abnormality. No indication at this time for repeat abdominal CT scan. Plan for discharge with GI surgery and PCP follow up. Patient will take 1/2 of her meal portions at a time to see if smaller volume, more  frequent feeds will improve diarrhea symptoms. Very low suspicion for volvulus, ischemia, SBO, or abscess.   At this time, I do not feel there is any life-threatening condition present. I have reviewed and discussed all results (EKG, imaging, lab, urine as appropriate), exam findings with patient. I have reviewed nursing notes and appropriate previous records.  I feel the patient is safe to be discharged home without further emergent workup. Discussed usual and customary return precautions. Patient and family (if present) verbalize understanding and are comfortable with this plan.  Patient will follow-up with their primary care provider. If they do not have a primary care provider, information for follow-up has been provided to them. All questions have been answered.  ____________________________________________  FINAL CLINICAL IMPRESSION(S) / ED DIAGNOSES  Final diagnoses:  Diarrhea, unspecified type  Nausea    MEDICATIONS GIVEN DURING THIS VISIT:  Medications  ondansetron (ZOFRAN-ODT) disintegrating tablet 4 mg (4 mg Oral Given 04/14/16 0454)  promethazine (PHENERGAN) tablet 50 mg (50 mg Oral Given 04/14/16 0629)  dicyclomine (BENTYL) capsule 10 mg (10 mg Oral Given 04/14/16 0629)     NEW OUTPATIENT MEDICATIONS STARTED DURING THIS VISIT:  Discharge Medication List as of 04/14/2016  7:39 AM    START taking these medications   Details  promethazine (PHENERGAN) 25 MG tablet Take 1 tablet (25 mg total) by mouth every 6 (six) hours as needed for nausea or vomiting., Starting Mon 04/14/2016, Print        Note:  This document was prepared using Dragon voice recognition software and may include unintentional dictation errors.  Nanda Quinton, MD Emergency Medicine   Margette Fast, MD 04/14/16 [phone]

## 2016-04-14 NOTE — ED Notes (Signed)
Patient tolerated PO fluids

## 2016-04-17 ENCOUNTER — Ambulatory Visit (HOSPITAL_COMMUNITY)
Admission: RE | Admit: 2016-04-17 | Discharge: 2016-04-17 | Disposition: A | Discharge status: Home / Self Care | Payer: Medicare Other | Source: Ambulatory Visit | Attending: General Surgery | Admitting: General Surgery

## 2016-04-17 ENCOUNTER — Other Ambulatory Visit: Payer: Self-pay | Admitting: General Surgery

## 2016-04-17 ENCOUNTER — Ambulatory Visit: Payer: Self-pay | Admitting: General Surgery

## 2016-04-17 ENCOUNTER — Telehealth: Payer: Self-pay | Admitting: Endocrinology

## 2016-04-17 DIAGNOSIS — E860 Dehydration: Secondary | ICD-10-CM | POA: Diagnosis present

## 2016-04-17 MED ORDER — LACTATED RINGERS IV SOLN: INTRAVENOUS | Status: DC

## 2016-04-17 MED ORDER — ONDANSETRON 4 MG PO TBDP
4.0000 mg | ORAL_TABLET | ORAL | Status: DC | PRN
  Filled 2016-04-17: qty 1

## 2016-04-17 MED ORDER — ACETAMINOPHEN 325 MG PO TABS: 325.0000 mg | ORAL_TABLET | ORAL | Status: DC | PRN

## 2016-04-17 MED ORDER — OXYCODONE HCL 5 MG/5ML PO SOLN
5.0000 mg | Freq: Once | ORAL | Status: AC
  Administered 2016-04-17: 5 mg via ORAL
  Filled 2016-04-17: qty 5

## 2016-04-17 MED ORDER — ONDANSETRON HCL 4 MG/2ML IJ SOLN
4.0000 mg | INTRAMUSCULAR | Status: DC | PRN
  Administered 2016-04-17: 4 mg via INTRAVENOUS
  Filled 2016-04-17: qty 2

## 2016-04-17 MED ORDER — ACETAMINOPHEN 160 MG/5ML PO SOLN
325.0000 mg | ORAL | Status: DC | PRN
  Filled 2016-04-17: qty 20.3

## 2016-04-17 MED ORDER — LACTATED RINGERS IV BOLUS (SEPSIS)
1000.0000 mL | Freq: Once | INTRAVENOUS | Status: AC
  Administered 2016-04-17: 1000 mL via INTRAVENOUS

## 2016-04-17 MED ORDER — ACETAMINOPHEN 325 MG RE SUPP
325.0000 mg | RECTAL | Status: DC | PRN
  Filled 2016-04-17: qty 2

## 2016-04-17 NOTE — Discharge Instructions (Signed)
Status post gastric bypass surgery; infusion of IV fluids

## 2016-04-17 NOTE — Telephone Encounter (Signed)
Noted. Awaiting paper work to be received.

## 2016-04-17 NOTE — Telephone Encounter (Signed)
[employer] calling for status of the paperwork for provider query.   The letter on the second page needs to be signed and dated please  [phone] ext [phone]

## 2016-04-17 NOTE — Progress Notes (Signed)
Called and spoke with [facility] @ Dr. Lear Ng office to clarify fluid orders.  Per MD, the order should be LR 1000/mls @ 2hr.   / Patient also arrived C/O of pain, and wanting to go to the ED.  I spoke with provider, and he really wants her to receive fluids here.  I explained to patient that she will get pain medication here and she was ok with to stay here and receive treatment.  / Order for pain medication placed.

## 2016-04-17 NOTE — Progress Notes (Addendum)
Patient ID: KARENDA DORE, female   DOB: [birth year], 50 y.o.   MRN: [id number] Provider: Excell Seltzer, MD  Associated Diagnosis: dehydration  Procedure: Infusion of 1000 ml Lactated Ringers over 2 hours as order.  Patient tolerated infusion well. Went over discharge instructions and copy given to patient. Alert, oriented and ambulatory at time of discharge. Discharged to home.

## 2016-04-18 ENCOUNTER — Encounter (HOSPITAL_COMMUNITY): Payer: Self-pay | Admitting: Emergency Medicine

## 2016-04-18 ENCOUNTER — Inpatient Hospital Stay (HOSPITAL_COMMUNITY): Payer: Medicare Other

## 2016-04-18 ENCOUNTER — Inpatient Hospital Stay (HOSPITAL_COMMUNITY)
Admission: EM | Admit: 2016-04-18 | Discharge: 2016-04-19 | DRG: 641 | Discharge status: Against Medical Advice | Payer: Medicare Other | Attending: General Surgery | Admitting: General Surgery

## 2016-04-18 DIAGNOSIS — G8929 Other chronic pain: Secondary | ICD-10-CM | POA: Diagnosis not present

## 2016-04-18 DIAGNOSIS — F319 Bipolar disorder, unspecified: Secondary | ICD-10-CM | POA: Diagnosis present

## 2016-04-18 DIAGNOSIS — E1140 Type 2 diabetes mellitus with diabetic neuropathy, unspecified: Secondary | ICD-10-CM | POA: Diagnosis not present

## 2016-04-18 DIAGNOSIS — K529 Noninfective gastroenteritis and colitis, unspecified: Secondary | ICD-10-CM | POA: Diagnosis not present

## 2016-04-18 DIAGNOSIS — J449 Chronic obstructive pulmonary disease, unspecified: Secondary | ICD-10-CM | POA: Diagnosis not present

## 2016-04-18 DIAGNOSIS — E785 Hyperlipidemia, unspecified: Secondary | ICD-10-CM | POA: Diagnosis present

## 2016-04-18 DIAGNOSIS — E039 Hypothyroidism, unspecified: Secondary | ICD-10-CM | POA: Diagnosis not present

## 2016-04-18 DIAGNOSIS — G8918 Other acute postprocedural pain: Secondary | ICD-10-CM | POA: Diagnosis present

## 2016-04-18 DIAGNOSIS — Z794 Long term (current) use of insulin: Secondary | ICD-10-CM

## 2016-04-18 DIAGNOSIS — Z90710 Acquired absence of both cervix and uterus: Secondary | ICD-10-CM

## 2016-04-18 DIAGNOSIS — K760 Fatty (change of) liver, not elsewhere classified: Secondary | ICD-10-CM | POA: Diagnosis not present

## 2016-04-18 DIAGNOSIS — I10 Essential (primary) hypertension: Secondary | ICD-10-CM | POA: Diagnosis not present

## 2016-04-18 DIAGNOSIS — R197 Diarrhea, unspecified: Secondary | ICD-10-CM

## 2016-04-18 DIAGNOSIS — Z5321 Procedure and treatment not carried out due to patient leaving prior to being seen by health care provider: Secondary | ICD-10-CM | POA: Diagnosis present

## 2016-04-18 DIAGNOSIS — K7689 Other specified diseases of liver: Secondary | ICD-10-CM | POA: Diagnosis not present

## 2016-04-18 DIAGNOSIS — Z885 Allergy status to narcotic agent status: Secondary | ICD-10-CM

## 2016-04-18 DIAGNOSIS — F209 Schizophrenia, unspecified: Secondary | ICD-10-CM | POA: Diagnosis not present

## 2016-04-18 DIAGNOSIS — E860 Dehydration: Principal | ICD-10-CM | POA: Diagnosis present

## 2016-04-18 DIAGNOSIS — D1803 Hemangioma of intra-abdominal structures: Secondary | ICD-10-CM | POA: Diagnosis not present

## 2016-04-18 DIAGNOSIS — F419 Anxiety disorder, unspecified: Secondary | ICD-10-CM | POA: Diagnosis present

## 2016-04-18 DIAGNOSIS — K219 Gastro-esophageal reflux disease without esophagitis: Secondary | ICD-10-CM | POA: Diagnosis not present

## 2016-04-18 DIAGNOSIS — Z87891 Personal history of nicotine dependence: Secondary | ICD-10-CM

## 2016-04-18 DIAGNOSIS — F329 Major depressive disorder, single episode, unspecified: Secondary | ICD-10-CM | POA: Diagnosis present

## 2016-04-18 DIAGNOSIS — Z7951 Long term (current) use of inhaled steroids: Secondary | ICD-10-CM

## 2016-04-18 DIAGNOSIS — Z833 Family history of diabetes mellitus: Secondary | ICD-10-CM

## 2016-04-18 DIAGNOSIS — Z825 Family history of asthma and other chronic lower respiratory diseases: Secondary | ICD-10-CM

## 2016-04-18 DIAGNOSIS — R109 Unspecified abdominal pain: Secondary | ICD-10-CM | POA: Diagnosis present

## 2016-04-18 DIAGNOSIS — I4891 Unspecified atrial fibrillation: Secondary | ICD-10-CM | POA: Diagnosis present

## 2016-04-18 DIAGNOSIS — R1084 Generalized abdominal pain: Secondary | ICD-10-CM | POA: Diagnosis not present

## 2016-04-18 DIAGNOSIS — Z886 Allergy status to analgesic agent status: Secondary | ICD-10-CM

## 2016-04-18 DIAGNOSIS — Z9884 Bariatric surgery status: Secondary | ICD-10-CM

## 2016-04-18 DIAGNOSIS — Z8249 Family history of ischemic heart disease and other diseases of the circulatory system: Secondary | ICD-10-CM

## 2016-04-18 LAB — COMPREHENSIVE METABOLIC PANEL
ALT: 14 U/L (ref 14–54)
AST: 13 U/L — ABNORMAL LOW (ref 15–41)
Albumin: 4.3 g/dL (ref 3.5–5.0)
Alkaline Phosphatase: 78 U/L (ref 38–126)
Anion gap: 11 (ref 5–15)
BUN: 10 mg/dL (ref 6–20)
CHLORIDE: 107 mmol/L (ref 101–111)
CO2: 23 mmol/L (ref 22–32)
CREATININE: 0.64 mg/dL (ref 0.44–1.00)
Calcium: 9.4 mg/dL (ref 8.9–10.3)
GFR calc non Af Amer: >60 mL/min (ref >60)
Glucose, Bld: 149 mg/dL — ABNORMAL HIGH (ref 65–99)
Potassium: 3.5 mmol/L (ref 3.5–5.1)
SODIUM: 141 mmol/L (ref 135–145)
Total Bilirubin: 1 mg/dL (ref 0.3–1.2)
Total Protein: 7.3 g/dL (ref 6.5–8.1)

## 2016-04-18 LAB — URINALYSIS, ROUTINE W REFLEX MICROSCOPIC
Glucose, UA: NEGATIVE mg/dL
Hgb urine dipstick: NEGATIVE
Ketones, ur: 40 mg/dL — AB
LEUKOCYTES UA: NEGATIVE
NITRITE: NEGATIVE
Protein, ur: NEGATIVE mg/dL
pH: 6 (ref 5.0–8.0)

## 2016-04-18 LAB — CBC WITH DIFFERENTIAL/PLATELET
BASOS ABS: 0 10*3/uL (ref 0.0–0.1)
Basophils Relative: 0 %
EOS ABS: 0.2 10*3/uL (ref 0.0–0.7)
EOS PCT: 2 %
HCT: 40.4 % (ref 36.0–46.0)
Hemoglobin: 13.4 g/dL (ref 12.0–15.0)
LYMPHS ABS: 2.9 10*3/uL (ref 0.7–4.0)
Lymphocytes Relative: 34 %
MCH: 27.7 pg (ref 26.0–34.0)
MCHC: 33.2 g/dL (ref 30.0–36.0)
MCV: 83.5 fL (ref 78.0–100.0)
Monocytes Absolute: 0.5 10*3/uL (ref 0.1–1.0)
Monocytes Relative: 5 %
Neutro Abs: 5.1 10*3/uL (ref 1.7–7.7)
Neutrophils Relative %: 59 %
PLATELETS: 219 10*3/uL (ref 150–400)
RBC: 4.84 MIL/uL (ref 3.87–5.11)
RDW: 14.2 % (ref 11.5–15.5)
WBC: 8.7 10*3/uL (ref 4.0–10.5)

## 2016-04-18 LAB — GLUCOSE, CAPILLARY
GLUCOSE-CAPILLARY: 145 mg/dL — AB (ref 65–99)
Glucose-Capillary: 142 mg/dL — ABNORMAL HIGH (ref 65–99)
Glucose-Capillary: 132 mg/dL — ABNORMAL HIGH (ref 65–99)
Glucose-Capillary: 163 mg/dL — ABNORMAL HIGH (ref 65–99)

## 2016-04-18 LAB — LIPASE, BLOOD: Lipase: 32 U/L (ref 11–51)

## 2016-04-18 MED ORDER — INSULIN GLARGINE 100 UNIT/ML ~~LOC~~ SOLN
100.0000 [IU] | Freq: Every day | SUBCUTANEOUS | Status: DC
  Filled 2016-04-18: qty 1

## 2016-04-18 MED ORDER — HYDROMORPHONE HCL 1 MG/ML IJ SOLN
0.5000 mg | INTRAMUSCULAR | Status: DC | PRN
  Administered 2016-04-18 – 2016-04-19 (×7): 0.5 mg via INTRAVENOUS
  Filled 2016-04-18 (×7): qty 1

## 2016-04-18 MED ORDER — KCL IN DEXTROSE-NACL 20-5-0.45 MEQ/L-%-% IV SOLN
INTRAVENOUS | Status: DC
  Administered 2016-04-18 – 2016-04-19 (×3): via INTRAVENOUS
  Filled 2016-04-18 (×4): qty 1000

## 2016-04-18 MED ORDER — IOPAMIDOL (ISOVUE-300) INJECTION 61%
100.0000 mL | Freq: Once | INTRAVENOUS | Status: AC | PRN
  Administered 2016-04-18: 100 mL via INTRAVENOUS

## 2016-04-18 MED ORDER — SODIUM CHLORIDE 0.9 % IV BOLUS (SEPSIS)
1000.0000 mL | Freq: Once | INTRAVENOUS | Status: AC
  Administered 2016-04-18: 1000 mL via INTRAVENOUS

## 2016-04-18 MED ORDER — FENTANYL CITRATE (PF) 100 MCG/2ML IJ SOLN
50.0000 ug | Freq: Once | INTRAMUSCULAR | Status: AC
  Administered 2016-04-18: 50 ug via INTRAVENOUS
  Filled 2016-04-18: qty 2

## 2016-04-18 MED ORDER — TIOTROPIUM BROMIDE MONOHYDRATE 18 MCG IN CAPS
18.0000 ug | ORAL_CAPSULE | Freq: Every day | RESPIRATORY_TRACT | Status: DC
  Administered 2016-04-18: 18 ug via RESPIRATORY_TRACT
  Filled 2016-04-18: qty 5

## 2016-04-18 MED ORDER — ONDANSETRON HCL 4 MG/2ML IJ SOLN
4.0000 mg | Freq: Once | INTRAMUSCULAR | Status: AC
  Administered 2016-04-18: 4 mg via INTRAVENOUS
  Filled 2016-04-18: qty 2

## 2016-04-18 MED ORDER — TRAZODONE HCL 100 MG PO TABS: 100.0000 mg | ORAL_TABLET | Freq: Every evening | ORAL | Status: DC | PRN

## 2016-04-18 MED ORDER — METHOCARBAMOL 500 MG PO TABS
500.0000 mg | ORAL_TABLET | Freq: Two times a day (BID) | ORAL | Status: DC | PRN
  Administered 2016-04-18 – 2016-04-19 (×3): 500 mg via ORAL
  Filled 2016-04-18 (×3): qty 1

## 2016-04-18 MED ORDER — LEVOTHYROXINE SODIUM 50 MCG PO TABS
50.0000 ug | ORAL_TABLET | Freq: Every day | ORAL | Status: DC
  Administered 2016-04-19: 50 ug via ORAL
  Filled 2016-04-18 (×2): qty 1

## 2016-04-18 MED ORDER — HYDROXYZINE HCL 25 MG PO TABS: 50.0000 mg | ORAL_TABLET | Freq: Three times a day (TID) | ORAL | Status: DC | PRN

## 2016-04-18 MED ORDER — POLYVINYL ALCOHOL 1.4 % OP SOLN
1.0000 [drp] | Freq: Two times a day (BID) | OPHTHALMIC | Status: DC | PRN
  Filled 2016-04-18: qty 15

## 2016-04-18 MED ORDER — ALBUTEROL SULFATE (2.5 MG/3ML) 0.083% IN NEBU: 2.5000 mg | INHALATION_SOLUTION | Freq: Four times a day (QID) | RESPIRATORY_TRACT | Status: DC | PRN

## 2016-04-18 MED ORDER — CYCLOSPORINE 0.05 % OP EMUL
1.0000 [drp] | Freq: Two times a day (BID) | OPHTHALMIC | Status: DC
  Administered 2016-04-18 – 2016-04-19 (×3): 1 [drp] via OPHTHALMIC
  Filled 2016-04-18 (×4): qty 1

## 2016-04-18 MED ORDER — PREGABALIN 75 MG PO CAPS
150.0000 mg | ORAL_CAPSULE | Freq: Two times a day (BID) | ORAL | Status: DC
  Administered 2016-04-18 – 2016-04-19 (×3): 150 mg via ORAL
  Filled 2016-04-18 (×3): qty 2

## 2016-04-18 MED ORDER — FLUOROMETHOLONE 0.1 % OP SUSP
1.0000 [drp] | Freq: Every day | OPHTHALMIC | Status: DC
  Administered 2016-04-18 – 2016-04-19 (×2): 1 [drp] via OPHTHALMIC
  Filled 2016-04-18: qty 5

## 2016-04-18 MED ORDER — ENOXAPARIN SODIUM 40 MG/0.4ML ~~LOC~~ SOLN
40.0000 mg | SUBCUTANEOUS | Status: DC
  Administered 2016-04-18 – 2016-04-19 (×2): 40 mg via SUBCUTANEOUS
  Filled 2016-04-18 (×2): qty 0.4

## 2016-04-18 MED ORDER — PREMIER PROTEIN SHAKE
2.0000 [oz_av] | ORAL | Status: DC
  Administered 2016-04-18 – 2016-04-19 (×6): 2 [oz_av] via ORAL
  Filled 2016-04-18 (×4): qty 325.31

## 2016-04-18 MED ORDER — PANTOPRAZOLE SODIUM 40 MG PO TBEC
40.0000 mg | DELAYED_RELEASE_TABLET | Freq: Every day | ORAL | Status: DC
Start: 2016-04-18 — End: 2016-04-20
  Administered 2016-04-18 – 2016-04-19 (×2): 40 mg via ORAL
  Filled 2016-04-18 (×2): qty 1

## 2016-04-18 MED ORDER — ONDANSETRON 4 MG PO TBDP: 4.0000 mg | ORAL_TABLET | Freq: Four times a day (QID) | ORAL | Status: DC | PRN

## 2016-04-18 MED ORDER — INSULIN ASPART 100 UNIT/ML ~~LOC~~ SOLN
0.0000 [IU] | Freq: Three times a day (TID) | SUBCUTANEOUS | Status: DC
  Administered 2016-04-18: 1 [IU] via SUBCUTANEOUS
  Administered 2016-04-18: 2 [IU] via SUBCUTANEOUS
  Administered 2016-04-19: 3 [IU] via SUBCUTANEOUS

## 2016-04-18 MED ORDER — CLONAZEPAM 0.5 MG PO TABS: 0.5000 mg | ORAL_TABLET | Freq: Three times a day (TID) | ORAL | Status: DC | PRN

## 2016-04-18 MED ORDER — FENTANYL CITRATE (PF) 100 MCG/2ML IJ SOLN
100.0000 ug | Freq: Once | INTRAMUSCULAR | Status: AC
  Administered 2016-04-18: 100 ug via INTRAVENOUS
  Filled 2016-04-18: qty 2

## 2016-04-18 MED ORDER — OXYCODONE HCL 5 MG/5ML PO SOLN
5.0000 mg | ORAL | Status: DC | PRN
  Administered 2016-04-18 – 2016-04-19 (×4): 5 mg via ORAL
  Filled 2016-04-18 (×5): qty 5

## 2016-04-18 MED ORDER — CARBOXYMETHYLCELLULOSE SODIUM 1 % OP SOLN
1.0000 [drp] | Freq: Two times a day (BID) | OPHTHALMIC | Status: DC
Start: 2016-04-18 — End: 2016-04-18

## 2016-04-18 MED ORDER — DILTIAZEM HCL ER COATED BEADS 240 MG PO CP24
240.0000 mg | ORAL_CAPSULE | Freq: Every day | ORAL | Status: DC
  Administered 2016-04-18: 240 mg via ORAL
  Filled 2016-04-18 (×2): qty 1

## 2016-04-18 MED ORDER — SIMETHICONE 80 MG PO CHEW: 80.0000 mg | CHEWABLE_TABLET | Freq: Two times a day (BID) | ORAL | Status: DC | PRN

## 2016-04-18 MED ORDER — LURASIDONE HCL 40 MG PO TABS
60.0000 mg | ORAL_TABLET | Freq: Every day | ORAL | Status: DC
  Administered 2016-04-18 – 2016-04-19 (×2): 60 mg via ORAL
  Filled 2016-04-18 (×2): qty 1

## 2016-04-18 MED ORDER — ALBUTEROL SULFATE HFA 108 (90 BASE) MCG/ACT IN AERS: 2.0000 | INHALATION_SPRAY | Freq: Four times a day (QID) | RESPIRATORY_TRACT | Status: DC | PRN

## 2016-04-18 MED ORDER — DIPHENHYDRAMINE HCL 50 MG/ML IJ SOLN: 12.5000 mg | Freq: Four times a day (QID) | INTRAMUSCULAR | Status: DC | PRN

## 2016-04-18 MED ORDER — INSULIN GLARGINE 100 UNIT/ML ~~LOC~~ SOLN
50.0000 [IU] | Freq: Every day | SUBCUTANEOUS | Status: DC
  Administered 2016-04-19: 50 [IU] via SUBCUTANEOUS
  Filled 2016-04-18: qty 0.5

## 2016-04-18 MED ORDER — LURASIDONE HCL 60 MG PO TABS: 60.0000 mg | ORAL_TABLET | Freq: Every day | ORAL | Status: DC

## 2016-04-18 MED ORDER — DULOXETINE HCL 30 MG PO CPEP
60.0000 mg | ORAL_CAPSULE | Freq: Every day | ORAL | Status: DC
  Administered 2016-04-18: 60 mg via ORAL
  Filled 2016-04-18: qty 2

## 2016-04-18 MED ORDER — DIPHENHYDRAMINE HCL 12.5 MG/5ML PO ELIX: 12.5000 mg | ORAL_SOLUTION | Freq: Four times a day (QID) | ORAL | Status: DC | PRN

## 2016-04-18 MED ORDER — ONDANSETRON HCL 4 MG/2ML IJ SOLN
4.0000 mg | Freq: Four times a day (QID) | INTRAMUSCULAR | Status: DC | PRN
  Administered 2016-04-18 – 2016-04-19 (×2): 4 mg via INTRAVENOUS
  Filled 2016-04-18 (×2): qty 2

## 2016-04-18 NOTE — ED Provider Notes (Signed)
[facility] DEPT Provider Note   CSN: [id number] Arrival date & time: 04/18/16  [id number]     History   Chief Complaint Chief Complaint  Patient presents with  . Abdominal Pain  . Emesis  . Diarrhea    HPI [name] is a 50 y.o. female.  The history is provided by the patient.  Abdominal Pain   This is a new problem. The problem occurs constantly. The problem has been gradually worsening. The pain is located in the generalized abdominal region. Associated symptoms include diarrhea, nausea and vomiting. Pertinent negatives include fever. Nothing relieves the symptoms.  Emesis   This is a new problem. The current episode started 12 to 24 hours ago. The problem has been gradually worsening. The emesis has an appearance of stomach contents. There has been no fever. Associated symptoms include abdominal pain and diarrhea. Pertinent negatives include no fever.  Diarrhea   This is a recurrent problem. The current episode started more than 1 week ago. The problem has been gradually worsening. The stool consistency is described as watery (small amount of blood tinged stool). Associated symptoms include abdominal pain and vomiting.   Patient with h/o recent gastric bypass (March 25 2016) presents with nausea/vomiting/diarrhea and abdominal pain She reports she has had loose diarrhea ever since 10/19 She reports her surgeon dr Excell Seltzer had her get [facility], and also tested her stool for infection and was told it was negative and started on imodium She reports her diarrhea has not improved and she is having diffuse abdominal cramping for past day.  She also reports "side pain" and back pain.   Past Medical History:  Diagnosis Date  . Abdominal pain   . Accidental drug overdose April 2013  . Anxiety   . Atrial fibrillation ([place]) 09/29/11   converted spontaneously  . Chronic back pain   . Chronic knee pain   . Chronic nausea   . Chronic pain   . COPD (chronic  obstructive pulmonary disease) ([place])   . Depression   . Diabetes mellitus    states her doctor took her off all DM meds in past month  . Diabetic neuropathy ([place])   . GERD (gastroesophageal reflux disease)   . Headache(784.0)    migraines   . HTN (hypertension)   . Hyperlipidemia   . Hypothyroidism   . Mental disorder    Bipolar and schizophrenic  . Requires supplemental oxygen    as needed per patient   . Schizophrenia ([place])   . Tobacco abuse     Patient Active Problem List   Diagnosis Date Noted  . Dehydration 04/17/2016  . Morbid obesity ([place]) 03/25/2016  . Type 2 diabetes mellitus with diabetic polyneuropathy, with long-term current use of insulin ([place]) 09/06/2015  . Vaginitis and vulvovaginitis, unspecified 04/19/2013  . BV (bacterial vaginosis) 04/19/2013  . HTN (hypertension) 11/04/2011  . Hypokalemia 10/02/2011  . atrial fibrillation 09/30/2011  . Acute respiratory failure ([place]) 09/29/2011  . COPD (chronic obstructive pulmonary disease) ([place]) 09/29/2011  . Accidental drug overdose 09/29/2011    Past Surgical History:  Procedure Laterality Date  . ABDOMINAL HYSTERECTOMY    . BLADDER SUSPENSION  03/04/2011   Procedure: [facility] PROCEDURE;  Surgeon: Elayne Snare MacDiarmid;  Location: [facility];  Service: Urology;  Laterality: N/A;  . CYSTOCELE REPAIR  03/04/2011   Procedure: ANTERIOR REPAIR (CYSTOCELE);  Surgeon: Reece Packer;  Location: [facility];  Service: Urology;  Laterality: N/A;  . CYSTOSCOPY  03/04/2011  Procedure: CYSTOSCOPY;  Surgeon: Elayne Snare MacDiarmid;  Location: [facility];  Service: Urology;  Laterality: N/A;  . GASTRIC ROUX-EN-Y N/A 03/25/2016   Procedure: LAPAROSCOPIC ROUX-EN-Y GASTRIC BYPASS WITH UPPER ENDOSCOPY;  Surgeon: Excell Seltzer, MD;  Location: [facility];  Service: General;  Laterality: N/A;  . KNEE SURGERY    . LAPAROSCOPIC ASSISTED VAGINAL HYSTERECTOMY  03/04/2011   Procedure: LAPAROSCOPIC ASSISTED VAGINAL HYSTERECTOMY;  Surgeon: Cyril Mourning, MD;   Location: [facility];  Service: Gynecology;  Laterality: N/A;    OB History    Gravida Para Term Preterm AB Living   3 3     0 3   SAB TAB Ectopic Multiple Live Births   0 0 0 0         Home Medications    Prior to Admission medications   Medication Sig Start Date End Date Taking? Authorizing Provider  acetaminophen (TYLENOL) 160 MG/5ML liquid Take 10 mLs (320 mg total) by mouth every 4 (four) hours as needed for fever. Patient not taking: Reported on 04/09/2016 03/27/16   Mickeal Skinner, MD  albuterol [facility] HFA) [phone] Base) MCG/ACT inhaler Inhale 2 puffs into the lungs every 6 (six) hours as needed for wheezing or shortness of breath.    Historical Provider, MD  clonazePAM (KLONOPIN) 0.5 MG tablet Take 0.5 mg by mouth 3 (three) times daily as needed for anxiety.  06/30/15   Historical Provider, MD  cycloSPORINE (RESTASIS) 0.05 % ophthalmic emulsion Place 1 drop into both eyes 2 (two) times daily.    Historical Provider, MD  diclofenac sodium (VOLTAREN) 1 % GEL Apply 1 application topically 3 (three) times daily. (Knees, back, shoulders, and back) 03/13/16   Historical Provider, MD  diltiazem (CARDIZEM CD) 240 MG 24 hr capsule Take 240 mg by mouth daily.    Historical Provider, MD  DULoxetine (CYMBALTA) 60 MG capsule Take 60 mg by mouth at bedtime.    Historical Provider, MD  fluorometholone (FML) 0.1 % ophthalmic suspension Place 1 drop into both eyes daily.    Historical Provider, MD  hydrOXYzine (ATARAX/VISTARIL) 50 MG tablet Take 50 mg by mouth 3 (three) times daily as needed for itching.  01/14/16   Historical Provider, MD  ibuprofen (ADVIL,MOTRIN) 800 MG tablet Take 800 mg by mouth every 8 (eight) hours as needed for headache (or pain).  02/18/16   Historical Provider, MD  Insulin Glargine (TOUJEO SOLOSTAR) 300 UNIT/ML SOPN Inject 100 Units into the skin daily after breakfast. 04/09/16   Renato Shin, MD  levothyroxine (SYNTHROID, LEVOTHROID) 50 MCG tablet Take 50 mcg by mouth  daily. 10/10/14   Historical Provider, MD  lidocaine (LIDODERM) 5 % Place 1 patch onto the skin daily as needed. 12 hours on/12 hours off 03/04/16   Historical Provider, MD  Lurasidone HCl (LATUDA) 60 MG TABS Take 60 mg by mouth at bedtime.    Historical Provider, MD  methocarbamol (ROBAXIN) 500 MG tablet Take 500 mg by mouth 2 (two) times daily as needed for muscle spasms.  03/11/16   Historical Provider, MD  ondansetron (ZOFRAN) 4 MG tablet Take 4 mg by mouth every 6 (six) hours as needed for nausea or vomiting.  03/12/16   Historical Provider, MD  pantoprazole (PROTONIX) 40 MG tablet Take 40 mg by mouth daily. 01/14/16   Historical Provider, MD  Polyvinyl Alcohol-Povidone (REFRESH OP) Place 1 drop into both eyes 2 (two) times daily as needed (dry eyes).    Historical Provider, MD  pregabalin (LYRICA) 75 MG  capsule Take 150 mg by mouth 2 (two) times daily.    Historical Provider, MD  promethazine (PHENERGAN) 25 MG tablet Take 1 tablet (25 mg total) by mouth every 6 (six) hours as needed for nausea or vomiting. 04/14/16   Margette Fast, MD  simethicone (MYLICON) 80 MG chewable tablet Chew 80-160 mg by mouth 2 (two) times daily as needed for flatulence.    Historical Provider, MD  simvastatin (ZOCOR) 20 MG tablet Take 20 mg by mouth daily. 02/08/15   Historical Provider, MD  SUMAtriptan 6 MG/0.5ML SOAJ Inject 6 mg into the muscle daily as needed (migraines).  06/09/15   Historical Provider, MD  traZODone (DESYREL) 100 MG tablet Take 100 mg by mouth at bedtime as needed for sleep.     Robbie Lis, MD    Family History Family History  Problem Relation Age of Onset  . Heart attack Father     28s  . Diabetes Mother   . Heart disease Mother   . Hypertension Mother   . Heart attack Sister     41  . COPD Other     Social History Social History  Substance Use Topics  . Smoking status: Former Smoker    Quit date: 07/24/2013  . Smokeless tobacco: Never Used  . Alcohol use No     Allergies     Morphine and related and Aspirin   Review of Systems Review of Systems  Constitutional: Positive for fatigue. Negative for fever.  Cardiovascular: Negative for chest pain.  Gastrointestinal: Positive for abdominal pain, blood in stool, diarrhea, nausea and vomiting.  Musculoskeletal: Positive for back pain.  All other systems reviewed and are negative.    Physical Exam Updated Vital Signs BP 126/86 (BP Location: Right Arm)   Pulse 99   Temp 98 F (36.7 C) (Oral)   Resp 24   LMP 01/08/2011   SpO2 95%   Physical Exam  CONSTITUTIONAL: Well developed/well nourished, anxious and uncomfortable appearing HEAD: Normocephalic/atraumatic EYES: EOMI/PERRL, no icterus ENMT: Mucous membranes dry NECK: supple no meningeal signs SPINE/BACK:entire spine nontender CV: S1/S2 noted, no murmurs/rubs/gallops noted LUNGS: Lungs are clear to auscultation bilaterally, no apparent distress ABDOMEN: soft, diffuse moderate tenderness, no rebound or guarding, bowel sounds noted throughout abdomen, healing incisions noted GU:no cva tenderness NEURO: Pt is awake/alert/appropriate, moves all extremitiesx4.  No facial droop.   EXTREMITIES: pulses normal/equal, full ROM SKIN: warm, color normal PSYCH: anxious  ED Treatments / Results  Labs (all labs ordered are listed, but only abnormal results are displayed) Labs Reviewed  COMPREHENSIVE METABOLIC PANEL - Abnormal; Notable for the following:       Result Value   Glucose, Bld 149 (*)    AST 13 (*)    All other components within normal limits  CBC WITH DIFFERENTIAL/PLATELET  LIPASE, BLOOD  URINALYSIS, ROUTINE W REFLEX MICROSCOPIC (NOT AT [facility])    EKG  EKG Interpretation None       Radiology No results found.  Procedures Procedures (including critical care time)  Medications Ordered in ED Medications  fentaNYL (SUBLIMAZE) injection 50 mcg (not administered)  ondansetron (ZOFRAN) injection 4 mg (not administered)  sodium chloride  0.9 % bolus 1,000 mL (1,000 mLs Intravenous New Bag/Given 04/18/16 0618)  ondansetron (ZOFRAN) injection 4 mg (4 mg Intravenous Given 04/18/16 0618)  fentaNYL (SUBLIMAZE) injection 100 mcg (100 mcg Intravenous Given 04/18/16 0618)     Initial Impression / Assessment and Plan / ED Course  I have reviewed the triage vital  signs and the nursing notes.  Pertinent labs & imaging results that were available during my care of the patient were reviewed by me and considered in my medical decision making (see chart for details).  Clinical Course    6:11 AM Pt here for continued abd pain/diarrhea and now reporting vomiting She reports recent outpatient stool studies that were negative (likely cdif) She reports imodium has not improved her symptoms She also reports associated vomiting at this time She has diffuse ABD tenderness She just had CT imaging recently Plan to hydrate, treat pain and reassess and if no improvement will consult surgery 7:19 AM Pt with continued abd pain and reporting nausea Due to recent gastric bypass, I have consulted general surgery (dr Kieth Brightly) to see patient in the ER  Final Clinical Impressions(s) / ED Diagnoses   Final diagnoses:  Diarrhea, unspecified type  Acute postoperative abdominal pain    New Prescriptions New Prescriptions   No medications on file     Ripley Fraise, MD 04/18/16 [id number]

## 2016-04-18 NOTE — ED Notes (Signed)
Pt tried to give urine sample & was not able to.

## 2016-04-18 NOTE — H&P (Signed)
[name] is an 50 y.o. female.   Chief Complaint: diarrhea and abdominal pain HPI: 50 yo female 3 weeks out from lap Roux-en-Y gastric bypass for obesity with diabetes. Post op she has had multiple issues with nausea and vomiting, not meeting dietary goals, abdominal pain. She has had multiple work ups without specific findings. For the last week she has had moderate to severe diarrhea with lower abdominal pain. Previously she had negative c diff screening and was given fluids at outpatient facility. She continues to have pain and presented to the ER this morning.  Past Medical History:  Diagnosis Date  . Abdominal pain   . Accidental drug overdose April 2013  . Anxiety   . Atrial fibrillation ([place]) 09/29/11   converted spontaneously  . Chronic back pain   . Chronic knee pain   . Chronic nausea   . Chronic pain   . COPD (chronic obstructive pulmonary disease) ([place])   . Depression   . Diabetes mellitus    states her doctor took her off all DM meds in past month  . Diabetic neuropathy ([place])   . GERD (gastroesophageal reflux disease)   . Headache(784.0)    migraines   . HTN (hypertension)   . Hyperlipidemia   . Hypothyroidism   . Mental disorder    Bipolar and schizophrenic  . Requires supplemental oxygen    as needed per patient   . Schizophrenia ([place])   . Tobacco abuse     Past Surgical History:  Procedure Laterality Date  . ABDOMINAL HYSTERECTOMY    . BLADDER SUSPENSION  03/04/2011   Procedure: [facility] PROCEDURE;  Surgeon: Elayne Snare MacDiarmid;  Location: [facility];  Service: Urology;  Laterality: N/A;  . CYSTOCELE REPAIR  03/04/2011   Procedure: ANTERIOR REPAIR (CYSTOCELE);  Surgeon: Reece Packer;  Location: [facility];  Service: Urology;  Laterality: N/A;  . CYSTOSCOPY  03/04/2011   Procedure: CYSTOSCOPY;  Surgeon: Elayne Snare MacDiarmid;  Location: [place] [facility];  Service: Urology;  Laterality: N/A;  . GASTRIC ROUX-EN-Y N/A 03/25/2016   Procedure: LAPAROSCOPIC ROUX-EN-Y GASTRIC BYPASS  WITH UPPER ENDOSCOPY;  Surgeon: Excell Seltzer, MD;  Location: [facility];  Service: General;  Laterality: N/A;  . KNEE SURGERY    . LAPAROSCOPIC ASSISTED VAGINAL HYSTERECTOMY  03/04/2011   Procedure: LAPAROSCOPIC ASSISTED VAGINAL HYSTERECTOMY;  Surgeon: Cyril Mourning, MD;  Location: [facility] [facility];  Service: Gynecology;  Laterality: N/A;    Family History  Problem Relation Age of Onset  . Heart attack Father     8s  . Diabetes Mother   . Heart disease Mother   . Hypertension Mother   . Heart attack Sister     64  . COPD Other    Social History:  reports that she quit smoking about 2 years ago. She has never used smokeless tobacco. She reports that she does not drink alcohol or use drugs.  Allergies:  Allergies  Allergen Reactions  . Morphine And Related Nausea And Vomiting  . Aspirin Nausea And Vomiting    Ok to take tylenol or ibuprofen    Medications Prior to Admission  Medication Sig Dispense Refill  . albuterol (PROAIR HFA) 108 (90 Base) MCG/ACT inhaler Inhale 2 puffs into the lungs every 6 (six) hours as needed for wheezing or shortness of breath.    . clonazePAM (KLONOPIN) 0.5 MG tablet Take 0.5 mg by mouth 3 (three) times daily as needed for anxiety.     . cycloSPORINE (RESTASIS) 0.05 % ophthalmic  emulsion Place 1 drop into both eyes 2 (two) times daily.    . diclofenac sodium (VOLTAREN) 1 % GEL Apply 1 application topically 3 (three) times daily. (Knees, back, shoulders, and back)  5  . diltiazem (CARDIZEM CD) 240 MG 24 hr capsule Take 240 mg by mouth daily.    . DULoxetine (CYMBALTA) 60 MG capsule Take 60 mg by mouth at bedtime.    . fluorometholone (FML) 0.1 % ophthalmic suspension Place 1 drop into both eyes daily.    . fluticasone (FLONASE) 50 MCG/ACT nasal spray Place 1 spray into both nostrils daily.    . hydrOXYzine (ATARAX/VISTARIL) 50 MG tablet Take 50 mg by mouth 3 (three) times daily as needed for itching.     . Insulin Glargine (TOUJEO SOLOSTAR) 300 UNIT/ML SOPN  Inject 100 Units into the skin daily after breakfast. 15 pen 1  . levothyroxine (SYNTHROID, LEVOTHROID) 50 MCG tablet Take 50 mcg by mouth daily.  5  . lidocaine (LIDODERM) 5 % Place 1 patch onto the skin daily as needed. 12 hours on/12 hours off  0  . Lurasidone HCl (LATUDA) 60 MG TABS Take 60 mg by mouth at bedtime.    . methocarbamol (ROBAXIN) 500 MG tablet Take 500 mg by mouth 2 (two) times daily as needed for muscle spasms.   2  . oxycodone-acetaminophen (LYNOX) 10-300 MG tablet Take 1 tablet by mouth every 8 (eight) hours as needed for pain.    . Polyvinyl Alcohol-Povidone (REFRESH OP) Place 1 drop into both eyes 2 (two) times daily as needed (dry eyes).    . pregabalin (LYRICA) 75 MG capsule Take 150 mg by mouth 2 (two) times daily.    . promethazine (PHENERGAN) 25 MG tablet Take 1 tablet (25 mg total) by mouth every 6 (six) hours as needed for nausea or vomiting. 30 tablet 0  . simethicone (MYLICON) 80 MG chewable tablet Chew 80-160 mg by mouth 2 (two) times daily as needed for flatulence.    . simvastatin (ZOCOR) 20 MG tablet Take 20 mg by mouth daily.  5  . SPIRIVA HANDIHALER 18 MCG inhalation capsule Place 1 puff into inhaler and inhale daily.    . SUMAtriptan 6 MG/0.5ML SOAJ Inject 6 mg into the muscle daily as needed (migraines).   5  . traZODone (DESYREL) 100 MG tablet Take 100 mg by mouth at bedtime as needed for sleep.     Marland Kitchen acetaminophen (TYLENOL) 160 MG/5ML liquid Take 10 mLs (320 mg total) by mouth every 4 (four) hours as needed for fever. (Patient not taking: Reported on 04/09/2016) 240 mL 0  . pantoprazole (PROTONIX) 40 MG tablet Take 40 mg by mouth daily.      Results for orders placed or performed during the hospital encounter of 04/18/16 (from the past 48 hour(s))  CBC with Differential     Status: None   Collection Time: 04/18/16  5:02 AM  Result Value Ref Range   WBC 8.7 4.0 - 10.5 K/uL   RBC 4.84 3.87 - 5.11 MIL/uL   Hemoglobin 13.4 12.0 - 15.0 g/dL   HCT 40.4 36.0  - 46.0 %   MCV 83.5 78.0 - 100.0 fL   MCH 27.7 26.0 - 34.0 pg   MCHC 33.2 30.0 - 36.0 g/dL   RDW 14.2 11.5 - 15.5 %   Platelets 219 150 - 400 K/uL   Neutrophils Relative % 59 %   Neutro Abs 5.1 1.7 - 7.7 K/uL   Lymphocytes Relative 34 %  Lymphs Abs 2.9 0.7 - 4.0 K/uL   Monocytes Relative 5 %   Monocytes Absolute 0.5 0.1 - 1.0 K/uL   Eosinophils Relative 2 %   Eosinophils Absolute 0.2 0.0 - 0.7 K/uL   Basophils Relative 0 %   Basophils Absolute 0.0 0.0 - 0.1 K/uL  Comprehensive metabolic panel     Status: Abnormal   Collection Time: 04/18/16  5:02 AM  Result Value Ref Range   Sodium 141 135 - 145 mmol/L   Potassium 3.5 3.5 - 5.1 mmol/L   Chloride 107 101 - 111 mmol/L   CO2 23 22 - 32 mmol/L   Glucose, Bld 149 (H) 65 - 99 mg/dL   BUN 10 6 - 20 mg/dL   Creatinine, Ser 0.64 0.44 - 1.00 mg/dL   Calcium 9.4 8.9 - 10.3 mg/dL   Total Protein 7.3 6.5 - 8.1 g/dL   Albumin 4.3 3.5 - 5.0 g/dL   AST 13 (L) 15 - 41 U/L   ALT 14 14 - 54 U/L   Alkaline Phosphatase 78 38 - 126 U/L   Total Bilirubin 1.0 0.3 - 1.2 mg/dL   GFR calc non Af Amer >60 >60 mL/min   GFR calc Af Amer >60 >60 mL/min    Comment: (NOTE) The eGFR has been calculated using the CKD EPI equation. This calculation has not been validated in all clinical situations. eGFR's persistently <60 mL/min signify possible Chronic Kidney Disease.    Anion gap 11 5 - 15  Lipase, blood     Status: None   Collection Time: 04/18/16  5:02 AM  Result Value Ref Range   Lipase 32 11 - 51 U/L  Glucose, capillary     Status: Abnormal   Collection Time: 04/18/16 10:44 AM  Result Value Ref Range   Glucose-Capillary 145 (H) 65 - 99 mg/dL   Ct Abdomen Pelvis W Contrast  Result Date: 04/18/2016 CLINICAL DATA:  Generalized progress abdominal pain with diarrhea, nausea teen, and vomiting. Diarrhea for 1 week. Emesis for less than 1 day. Gastric bypass surgery 03/25/2016. EXAM: CT ABDOMEN AND PELVIS WITH CONTRAST TECHNIQUE: Multidetector CT  imaging of the abdomen and pelvis was performed using the standard protocol following bolus administration of intravenous contrast. CONTRAST:  119m ISOVUE-300 IOPAMIDOL (ISOVUE-300) INJECTION 61% COMPARISON:  CT of the abdomen and pelvis 03/31/2016. FINDINGS: Lower chest: The lung bases are clear without focal nodule, mass, or airspace disease. The heart size is normal. No significant pleural or pericardial effusion is present. Hepatobiliary: There is mild fatty infiltration the liver. Ill-defined 8 mm cyst or hemangioma near the inferior aspect of the right lobe of the liver is stable. No other focal hepatic lesions are present. Pancreas: No significant inflammatory changes are present. There is no solid or cystic mass lesion. No significant ductal dilation is present. Spleen: Normal in size without focal abnormality. Adrenals/Urinary Tract: Adrenal glands are normal bilaterally. The kidneys and ureters are within normal limits. The urinary bladder is unremarkable. Stomach/Bowel: Gastric bypass surgery is noted. The anastomosis is intact. There is no free fluid or evidence for leak. The adjacent bowel is unremarkable. Native duodenum is normal is well. Remainder the small bowel is unremarkable. The appendix is visualized and normal. The ascending and transverse colon are unremarkable. The descending colon is within normal limits as well. There is mild diffuse wall thickening within the sigmoid colon. No obstruction is present. Vascular/Lymphatic: Mild atherosclerotic calcifications are again seen. There is no significant aneurysm. No significant adenopathy is present. Reproductive:  Hysterectomy is noted. Adnexa are within normal limits for age. Other: There is no significant free fluid or free air. Fat herniates into the inguinal canals bilaterally. Previously noted subcutaneous gas has resolved. Musculoskeletal: Sclerotic changes of the right SI joint are again noted. Vertebral body heights and alignment are  maintained. Degenerative changes are noted anteriorly at T11-12. No focal lytic or blastic lesions are present. IMPRESSION: 1. Gastric bypass surgery without complication or acute abnormality. 2. Mild diffuse wall thickening in the sigmoid colon may represent inflammatory change. There is no adjacent inflammation in the mesenteries. No abscess or free air is present. 3. Hepatic steatosis. 4. Degenerative changes of the thoracolumbar spine and pelvis. Electronically Signed   By: San Morelle M.D.   On: 04/18/2016 09:04    ROS  Blood pressure 115/65, pulse 85, temperature 98.4 F (36.9 C), temperature source Oral, resp. rate 18, height 5' 1"  (1.549 m), weight 90.7 kg (200 lb), last menstrual period 01/08/2011, SpO2 98 %. Physical Exam   Assessment/Plan 50 yo female s/p lap Roux-en-Y gastric bypass with diarrhea abdominal pain and poor PO intake -admit for rehydration and monitor intake -CT scan to look for reason for pain -IVF -pain control  CT scan shows some thickening of sigmoid -treat with empiric flagyl  Mickeal Skinner, MD 04/18/2016, 11:16 AM

## 2016-04-18 NOTE — ED Triage Notes (Signed)
Pt states she has had diarrhea since the 19th of October  Pt states she started having vomiting yesterday afternoon  Pt is c/o abd pain, epigastric pain and pain in her sides from all the vomiting  Pt states she [facility] yesterday and was given fluids

## 2016-04-18 NOTE — ED Notes (Signed)
Report given to [place], Therapist, sports on [place].

## 2016-04-18 NOTE — ED Notes (Signed)
Patient transported to CT 

## 2016-04-19 LAB — GLUCOSE, CAPILLARY
GLUCOSE-CAPILLARY: 139 mg/dL — AB (ref 65–99)
Glucose-Capillary: 154 mg/dL — ABNORMAL HIGH (ref 65–99)
Glucose-Capillary: 162 mg/dL — ABNORMAL HIGH (ref 65–99)

## 2016-04-19 MED ORDER — OXYCODONE-ACETAMINOPHEN 5-325 MG PO TABS
1.0000 | ORAL_TABLET | Freq: Four times a day (QID) | ORAL | Status: DC | PRN
  Administered 2016-04-19 (×2): 2 via ORAL
  Filled 2016-04-19 (×2): qty 2

## 2016-04-19 MED ORDER — METRONIDAZOLE 500 MG PO TABS
500.0000 mg | ORAL_TABLET | Freq: Three times a day (TID) | ORAL | Status: DC
  Administered 2016-04-19: 500 mg via ORAL
  Filled 2016-04-19: qty 1

## 2016-04-19 NOTE — Consult Note (Signed)
[facility] Consultation Note  Referring Provider: Dr. Gurney Maxin Primary Care Physician:  Philis Fendt, MD Primary Gastroenterologist:  Dr. Carol Ada  Reason for Consultation:  Diarrhea, colitis on CT scan  HPI: [name] is a 50 y.o. female with left lower quadrant abdominal pain and diarrhea and blood in stool.  Started about one week ago.  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  Her diarrhea and pain have improved since hospitalization.  CT scan showed mild sigmoid colon thickening.  Patient reports having colonoscopy by Dr. Benson Norway about one year ago; results not available.   Past Medical History:  Diagnosis Date  . Abdominal pain   . Accidental drug overdose April 2013  . Anxiety   . Atrial fibrillation ([place]) 09/29/11   converted spontaneously  . Chronic back pain   . Chronic knee pain   . Chronic nausea   . Chronic pain   . COPD (chronic obstructive pulmonary disease) ([place])   . Depression   . Diabetes mellitus    states her doctor took her off all DM meds in past month  . Diabetic neuropathy ([place])   . GERD (gastroesophageal reflux disease)   . Headache(784.0)    migraines   . HTN (hypertension)   . Hyperlipidemia   . Hypothyroidism   . Mental disorder    Bipolar and schizophrenic  . Requires supplemental oxygen    as needed per patient   . Schizophrenia ([place])   . Tobacco abuse     Past Surgical History:  Procedure Laterality Date  . ABDOMINAL HYSTERECTOMY    . BLADDER SUSPENSION  03/04/2011   Procedure: [facility] PROCEDURE;  Surgeon: Elayne Snare MacDiarmid;  Location: [facility];  Service: Urology;  Laterality: N/A;  . CYSTOCELE REPAIR  03/04/2011   Procedure: ANTERIOR REPAIR (CYSTOCELE);  Surgeon: Reece Packer;  Location: [facility];  Service: Urology;  Laterality: N/A;  . CYSTOSCOPY  03/04/2011   Procedure: CYSTOSCOPY;  Surgeon: Elayne Snare MacDiarmid;  Location: [facility];  Service: Urology;  Laterality: N/A;  .  GASTRIC ROUX-EN-Y N/A 03/25/2016   Procedure: LAPAROSCOPIC ROUX-EN-Y GASTRIC BYPASS WITH UPPER ENDOSCOPY;  Surgeon: Excell Seltzer, MD;  Location: [facility];  Service: General;  Laterality: N/A;  . KNEE SURGERY    . LAPAROSCOPIC ASSISTED VAGINAL HYSTERECTOMY  03/04/2011   Procedure: LAPAROSCOPIC ASSISTED VAGINAL HYSTERECTOMY;  Surgeon: Cyril Mourning, MD;  Location: [facility];  Service: Gynecology;  Laterality: N/A;    Prior to Admission medications   Medication Sig Start Date End Date Taking? Authorizing Provider  albuterol (PROAIR HFA) 108 (90 Base) MCG/ACT inhaler Inhale 2 puffs into the lungs every 6 (six) hours as needed for wheezing or shortness of breath.   Yes Historical Provider, MD  clonazePAM (KLONOPIN) 0.5 MG tablet Take 0.5 mg by mouth 3 (three) times daily as needed for anxiety.  06/30/15  Yes Historical Provider, MD  cycloSPORINE (RESTASIS) 0.05 % ophthalmic emulsion Place 1 drop into both eyes 2 (two) times daily.   Yes Historical Provider, MD  diclofenac sodium (VOLTAREN) 1 % GEL Apply 1 application topically 3 (three) times daily. (Knees, back, shoulders, and back) 03/13/16  Yes Historical Provider, MD  diltiazem (CARDIZEM CD) 240 MG 24 hr capsule Take 240 mg by mouth daily.   Yes Historical Provider, MD  DULoxetine (CYMBALTA) 60 MG capsule Take 60 mg by mouth at bedtime.   Yes Historical Provider, MD  fluorometholone (FML) 0.1 % ophthalmic suspension Place 1  drop into both eyes daily.   Yes Historical Provider, MD  fluticasone (FLONASE) 50 MCG/ACT nasal spray Place 1 spray into both nostrils daily. 04/08/16  Yes Historical Provider, MD  hydrOXYzine (ATARAX/VISTARIL) 50 MG tablet Take 50 mg by mouth 3 (three) times daily as needed for itching.  01/14/16  Yes Historical Provider, MD  Insulin Glargine (TOUJEO SOLOSTAR) 300 UNIT/ML SOPN Inject 100 Units into the skin daily after breakfast. 04/09/16  Yes Renato Shin, MD  levothyroxine (SYNTHROID, LEVOTHROID) 50 MCG tablet Take 50 mcg by  mouth daily. 10/10/14  Yes Historical Provider, MD  lidocaine (LIDODERM) 5 % Place 1 patch onto the skin daily as needed. 12 hours on/12 hours off 03/04/16  Yes Historical Provider, MD  Lurasidone HCl (LATUDA) 60 MG TABS Take 60 mg by mouth at bedtime.   Yes Historical Provider, MD  methocarbamol (ROBAXIN) 500 MG tablet Take 500 mg by mouth 2 (two) times daily as needed for muscle spasms.  03/11/16  Yes Historical Provider, MD  oxycodone-acetaminophen (LYNOX) 10-300 MG tablet Take 1 tablet by mouth every 8 (eight) hours as needed for pain.   Yes Historical Provider, MD  Polyvinyl Alcohol-Povidone (REFRESH OP) Place 1 drop into both eyes 2 (two) times daily as needed (dry eyes).   Yes Historical Provider, MD  pregabalin (LYRICA) 75 MG capsule Take 150 mg by mouth 2 (two) times daily.   Yes Historical Provider, MD  promethazine (PHENERGAN) 25 MG tablet Take 1 tablet (25 mg total) by mouth every 6 (six) hours as needed for nausea or vomiting. 04/14/16  Yes Margette Fast, MD  simethicone (MYLICON) 80 MG chewable tablet Chew 80-160 mg by mouth 2 (two) times daily as needed for flatulence.   Yes Historical Provider, MD  simvastatin (ZOCOR) 20 MG tablet Take 20 mg by mouth daily. 02/08/15  Yes Historical Provider, MD  SPIRIVA HANDIHALER 18 MCG inhalation capsule Place 1 puff into inhaler and inhale daily. 04/08/16  Yes Historical Provider, MD  SUMAtriptan 6 MG/0.5ML SOAJ Inject 6 mg into the muscle daily as needed (migraines).  06/09/15  Yes Historical Provider, MD  traZODone (DESYREL) 100 MG tablet Take 100 mg by mouth at bedtime as needed for sleep.    Yes Robbie Lis, MD  acetaminophen (TYLENOL) 160 MG/5ML liquid Take 10 mLs (320 mg total) by mouth every 4 (four) hours as needed for fever. Patient not taking: Reported on 04/09/2016 03/27/16   Arta Bruce Kinsinger, MD  pantoprazole (PROTONIX) 40 MG tablet Take 40 mg by mouth daily. 01/14/16   Historical Provider, MD    Current Facility-Administered  Medications  Medication Dose Route Frequency Provider Last Rate Last Dose  . albuterol (PROVENTIL) (2.5 MG/3ML) 0.083% nebulizer solution 2.5 mg  2.5 mg Nebulization Q6H PRN Arta Bruce Kinsinger, MD      . clonazePAM [facility]) tablet 0.5 mg  0.5 mg Oral TID PRN Arta Bruce Kinsinger, MD      . cycloSPORINE (RESTASIS) 0.05 % ophthalmic emulsion 1 drop  1 drop Both Eyes BID Mickeal Skinner, MD   1 drop at 04/19/16 0946  . dextrose 5 % and 0.45 % NaCl with KCl 20 mEq/L infusion   Intravenous Continuous Armandina Gemma, MD 75 mL/hr at 04/19/16 1049    . diltiazem (CARDIZEM CD) 24 hr capsule 240 mg  240 mg Oral Daily Mickeal Skinner, MD   240 mg at 04/18/16 1141  . diphenhydrAMINE (BENADRYL) 12.5 MG/5ML elixir 12.5 mg  12.5 mg Oral Q6H PRN Arta Bruce  Kinsinger, MD       Or  . diphenhydrAMINE (BENADRYL) injection 12.5 mg  12.5 mg Intravenous Q6H PRN Arta Bruce Kinsinger, MD      . DULoxetine (CYMBALTA) DR capsule 60 mg  60 mg Oral QHS Mickeal Skinner, MD   60 mg at 04/18/16 2257  . enoxaparin (LOVENOX) injection 40 mg  40 mg Subcutaneous Q24H Mickeal Skinner, MD   40 mg at 04/19/16 0933  . fluorometholone (FML) 0.1 % ophthalmic suspension 1 drop  1 drop Both Eyes Daily Arta Bruce Kinsinger, MD   1 drop at 04/19/16 0933  . hydrOXYzine (ATARAX/VISTARIL) tablet 50 mg  50 mg Oral TID PRN Arta Bruce Kinsinger, MD      . insulin aspart (novoLOG) injection 0-15 Units  0-15 Units Subcutaneous TID WC Mickeal Skinner, MD   3 Units at 04/19/16 1215  . insulin glargine (LANTUS) injection 50 Units  50 Units Subcutaneous QPC breakfast Mickeal Skinner, MD   50 Units at 04/19/16 1215  . levothyroxine (SYNTHROID, LEVOTHROID) tablet 50 mcg  50 mcg Oral QAC breakfast Mickeal Skinner, MD   50 mcg at 04/19/16 0929  . lurasidone (LATUDA) tablet 60 mg  60 mg Oral Q supper Mickeal Skinner, MD   60 mg at 04/18/16 1641  . methocarbamol (ROBAXIN) tablet 500 mg  500 mg Oral BID PRN Mickeal Skinner, MD   500 mg at 04/19/16 0535  . metroNIDAZOLE (FLAGYL) tablet 500 mg  500 mg Oral Q8H Armandina Gemma, MD   500 mg at 04/19/16 1214  . ondansetron (ZOFRAN-ODT) disintegrating tablet 4 mg  4 mg Oral Q6H PRN Mickeal Skinner, MD       Or  . ondansetron [facility]) injection 4 mg  4 mg Intravenous Q6H PRN Mickeal Skinner, MD   4 mg at 04/19/16 0616  . oxyCODONE (ROXICODONE) 5 MG/5ML solution 5 mg  5 mg Oral Q4H PRN Mickeal Skinner, MD   5 mg at 04/19/16 0535  . oxyCODONE-acetaminophen (PERCOCET/ROXICET) 5-325 MG per tablet 1-2 tablet  1-2 tablet Oral Q6H PRN Armandina Gemma, MD   2 tablet at 04/19/16 1328  . pantoprazole (PROTONIX) EC tablet 40 mg  40 mg Oral Daily Mickeal Skinner, MD   40 mg at 04/19/16 0929  . polyvinyl alcohol (LIQUIFILM TEARS) 1.4 % ophthalmic solution 1 drop  1 drop Both Eyes BID PRN Arta Bruce Kinsinger, MD      . pregabalin (LYRICA) capsule 150 mg  150 mg Oral BID Mickeal Skinner, MD   150 mg at 04/19/16 0930  . protein supplement (PREMIER PROTEIN) liquid  2 oz Oral Q4H Mickeal Skinner, MD   2 oz at 04/19/16 1200  . simethicone (MYLICON) chewable tablet 80-160 mg  80-160 mg Oral BID PRN Arta Bruce Kinsinger, MD      . tiotropium [facility]) inhalation capsule 18 mcg  18 mcg Inhalation Daily Mickeal Skinner, MD   18 mcg at 04/18/16 1255  . traZODone (DESYREL) tablet 100 mg  100 mg Oral QHS PRN Mickeal Skinner, MD        Allergies as of 04/18/2016 - Review Complete 04/18/2016  Allergen Reaction Noted  . Morphine and related Nausea And Vomiting 03/27/2016  . Aspirin Nausea And Vomiting 02/18/2011    Family History  Problem Relation Age of Onset  . Heart attack Father     20s  . Diabetes Mother   . Heart disease Mother   .  Hypertension Mother   . Heart attack Sister     62  . COPD Other     Social History   Social History  . Marital status: Legally Separated    Spouse name: N/A  . Number of children: N/A  . Years of  education: N/A   Occupational History  . Not on file.   Social History Main Topics  . Smoking status: Former Smoker    Quit date: 07/24/2013  . Smokeless tobacco: Never Used  . Alcohol use No  . Drug use: No  . Sexual activity: Yes    Partners: Male   Other Topics Concern  . Not on file   Social History Narrative  . No narrative on file    Review of Systems: As per HPI, all others negative  Physical Exam: Vital signs in last 24 hours: Temp:  [97.8 F (36.6 C)-98.7 F (37.1 C)] 97.8 F (36.6 C) (10/28 0437) Pulse Rate:  [67-85] 74 (10/28 0437) Resp:  [18] 18 (10/28 0437) BP: (101-123)/(68-79) 101/68 (10/28 0437) SpO2:  [95 %-98 %] 98 % (10/28 0437) Last BM Date: 04/18/16 General:   Alert, overweight, Well-developed, well-nourished, pleasant and cooperative in NAD Head:  Normocephalic and atraumatic. Eyes:  Sclera clear, no icterus.   Conjunctiva pink. Ears:  Normal auditory acuity. Nose:  No deformity, discharge,  or lesions. Mouth:  No deformity or lesions.  Oropharynx pink & moist. Neck:  Supple; no masses or thyromegaly. Lungs:  Clear throughout to auscultation.   No wheezes, crackles, or rhonchi. No acute distress. Heart:  Regular rate and rhythm; no murmurs, clicks, rubs,  or gallops. Abdomen:  Soft,protuberant, mild left lower abdominal tenderness without peritonitis. No masses, hepatosplenomegaly or hernias noted. Hypoactive bowel sounds, without guarding, and without rebound.     Msk:  Symmetrical without gross deformities. Normal posture. Pulses:  Normal pulses noted. Extremities:  Without clubbing or edema. Neurologic:  Alert and  oriented x4; diffusely weak, otherwise grossly normal neurologically. Skin:  Intact without significant lesions or rashes. Psych:  Alert and cooperative. Depressed mood, flat affect   Lab Results:  Recent Labs  04/18/16 0502  WBC 8.7  HGB 13.4  HCT 40.4  PLT 219   BMET  Recent Labs  04/18/16 0502  NA 141  K 3.5  CL  107  CO2 23  GLUCOSE 149*  BUN 10  CREATININE 0.64  CALCIUM 9.4   LFT  Recent Labs  04/18/16 0502  PROT 7.3  ALBUMIN 4.3  AST 13*  ALT 14  ALKPHOS 78  BILITOT 1.0   PT/INR No results for input(s): LABPROT, INR in the last 72 hours.  Studies/Results: Ct Abdomen Pelvis W Contrast  Result Date: 04/18/2016 CLINICAL DATA:  Generalized progress abdominal pain with diarrhea, nausea teen, and vomiting. Diarrhea for 1 week. Emesis for less than 1 day. Gastric bypass surgery 03/25/2016. EXAM: CT ABDOMEN AND PELVIS WITH CONTRAST TECHNIQUE: Multidetector CT imaging of the abdomen and pelvis was performed using the standard protocol following bolus administration of intravenous contrast. CONTRAST:  170mL ISOVUE-300 IOPAMIDOL (ISOVUE-300) INJECTION 61% COMPARISON:  CT of the abdomen and pelvis 03/31/2016. FINDINGS: Lower chest: The lung bases are clear without focal nodule, mass, or airspace disease. The heart size is normal. No significant pleural or pericardial effusion is present. Hepatobiliary: There is mild fatty infiltration the liver. Ill-defined 8 mm cyst or hemangioma near the inferior aspect of the right lobe of the liver is stable. No other focal hepatic lesions are present. Pancreas:  No significant inflammatory changes are present. There is no solid or cystic mass lesion. No significant ductal dilation is present. Spleen: Normal in size without focal abnormality. Adrenals/Urinary Tract: Adrenal glands are normal bilaterally. The kidneys and ureters are within normal limits. The urinary bladder is unremarkable. Stomach/Bowel: Gastric bypass surgery is noted. The anastomosis is intact. There is no free fluid or evidence for leak. The adjacent bowel is unremarkable. Native duodenum is normal is well. Remainder the small bowel is unremarkable. The appendix is visualized and normal. The ascending and transverse colon are unremarkable. The descending colon is within normal limits as well. There is  mild diffuse wall thickening within the sigmoid colon. No obstruction is present. Vascular/Lymphatic: Mild atherosclerotic calcifications are again seen. There is no significant aneurysm. No significant adenopathy is present. Reproductive: Hysterectomy is noted. Adnexa are within normal limits for age. Other: There is no significant free fluid or free air. Fat herniates into the inguinal canals bilaterally. Previously noted subcutaneous gas has resolved. Musculoskeletal: Sclerotic changes of the right SI joint are again noted. Vertebral body heights and alignment are maintained. Degenerative changes are noted anteriorly at T11-12. No focal lytic or blastic lesions are present. IMPRESSION: 1. Gastric bypass surgery without complication or acute abnormality. 2. Mild diffuse wall thickening in the sigmoid colon may represent inflammatory change. There is no adjacent inflammation in the mesenteries. No abscess or free air is present. 3. Hepatic steatosis. 4. Degenerative changes of the thoracolumbar spine and pelvis. Electronically Signed   By: San Morelle M.D.   On: 04/18/2016 09:04   Impression:  1.  Nausea and vomiting after Roux-en-Y gastric bypass.  Improving. 2.  Diarrhea post Roux-en-Y gastric bypass, improving. 3.  Lower abdominal pain, improving. 4.  Blood in stool, resolved. 5.  Abnormal CT scan, sigmoid colon thickening.  Plan:  1.  Continue empiric antibiotics. 2.  Advance diet as tolerated and directed by surgical team. 3.  GI stool pathogen panel. 4.  Follow clinically; if continues to improve, would manage expectantly; if pain, diarrhea, bleeding worsen/recur, might need to consider flexible sigmoidoscopy. 5.  [facility] will follow.   LOS: 1 day   Oluwatoni Rotunno M  04/19/2016, 1:30 PM  Pager [phone] If no answer or after 5 PM call [phone]

## 2016-04-19 NOTE — Progress Notes (Signed)
General Surgery [facility]  04/19/2016  Assessment & Plan: HD#2 s/p lap Roux-en-Y gastric bypass with diarrhea, dehydration  IV hydration - decrease rate this AM  Advance bariatric diet  Colitis involving sigmoid colon by CT scan  Empiric Flagyl  Cdiff negative         [name] Regal, MD, [facility]       Office: [phone]    Subjective: Patient states she feels much better this AM.  One loose BM earlier today.  Wants to eat.  Objective: Vital signs in last 24 hours: Temp:  [97.8 F (36.6 C)-98.7 F (37.1 C)] 97.8 F (36.6 C) (10/28 0437) Pulse Rate:  [67-85] 74 (10/28 0437) Resp:  [18] 18 (10/28 0437) BP: (101-123)/(68-79) 101/68 (10/28 0437) SpO2:  [94 %-98 %] 98 % (10/28 0437) Last BM Date: 04/18/16  Intake/Output from previous day: 10/27 0701 - 10/28 0700 In: 2791.7 [P.O.:150; I.V.:1641.7; IV Piggyback:1000] Out: 400 [Urine:400] Intake/Output this shift: Total I/O In: 240 [P.O.:240] Out: -   Physical Exam: HEENT - sclerae clear, mucous membranes moist Neck - soft Chest - clear bilaterally Cor - RRR Abdomen - soft, obese; BS present; non-tender Ext - no edema, non-tender Neuro - alert & oriented, no focal deficits  Lab Results:   Recent Labs  04/18/16 0502  WBC 8.7  HGB 13.4  HCT 40.4  PLT 219   BMET  Recent Labs  04/18/16 0502  NA 141  K 3.5  CL 107  CO2 23  GLUCOSE 149*  BUN 10  CREATININE 0.64  CALCIUM 9.4   PT/INR No results for input(s): LABPROT, INR in the last 72 hours. Comprehensive Metabolic Panel:    Component Value Date/Time   NA 141 04/18/2016 0502   NA 141 04/14/2016 0611   K 3.5 04/18/2016 0502   K 3.9 04/14/2016 0611   CL 107 04/18/2016 0502   CL 107 04/14/2016 0611   CO2 23 04/18/2016 0502   CO2 26 03/31/2016 2000   BUN 10 04/18/2016 0502   BUN 14 04/14/2016 0611   CREATININE 0.64 04/18/2016 0502   CREATININE 0.60 04/14/2016 0611   GLUCOSE 149  (H) 04/18/2016 0502   GLUCOSE 153 (H) 04/14/2016 0611   CALCIUM 9.4 04/18/2016 0502   CALCIUM 9.1 03/31/2016 2000   AST 13 (L) 04/18/2016 0502   AST 17 03/31/2016 2000   ALT 14 04/18/2016 0502   ALT 27 03/31/2016 2000   ALKPHOS 78 04/18/2016 0502   ALKPHOS 68 03/31/2016 2000   BILITOT 1.0 04/18/2016 0502   BILITOT 0.7 03/31/2016 2000   PROT 7.3 04/18/2016 0502   PROT 6.2 (L) 03/31/2016 2000   ALBUMIN 4.3 04/18/2016 0502   ALBUMIN 3.8 03/31/2016 2000    Studies/Results: Ct Abdomen Pelvis W Contrast  Result Date: 04/18/2016 CLINICAL DATA:  Generalized progress abdominal pain with diarrhea, nausea teen, and vomiting. Diarrhea for 1 week. Emesis for less than 1 day. Gastric bypass surgery 03/25/2016. EXAM: CT ABDOMEN AND PELVIS WITH CONTRAST TECHNIQUE: Multidetector CT imaging of the abdomen and pelvis was performed using the standard protocol following bolus administration of intravenous contrast. CONTRAST:  168mL ISOVUE-300 IOPAMIDOL (ISOVUE-300) INJECTION 61% COMPARISON:  CT of the abdomen and pelvis 03/31/2016. FINDINGS: Lower chest: The lung bases are clear without focal nodule, mass, or airspace disease. The heart size is normal. No significant pleural or pericardial effusion is present. Hepatobiliary: There is mild fatty infiltration the liver. Ill-defined 8 mm  cyst or hemangioma near the inferior aspect of the right lobe of the liver is stable. No other focal hepatic lesions are present. Pancreas: No significant inflammatory changes are present. There is no solid or cystic mass lesion. No significant ductal dilation is present. Spleen: Normal in size without focal abnormality. Adrenals/Urinary Tract: Adrenal glands are normal bilaterally. The kidneys and ureters are within normal limits. The urinary bladder is unremarkable. Stomach/Bowel: Gastric bypass surgery is noted. The anastomosis is intact. There is no free fluid or evidence for leak. The adjacent bowel is unremarkable. Native  duodenum is normal is well. Remainder the small bowel is unremarkable. The appendix is visualized and normal. The ascending and transverse colon are unremarkable. The descending colon is within normal limits as well. There is mild diffuse wall thickening within the sigmoid colon. No obstruction is present. Vascular/Lymphatic: Mild atherosclerotic calcifications are again seen. There is no significant aneurysm. No significant adenopathy is present. Reproductive: Hysterectomy is noted. Adnexa are within normal limits for age. Other: There is no significant free fluid or free air. Fat herniates into the inguinal canals bilaterally. Previously noted subcutaneous gas has resolved. Musculoskeletal: Sclerotic changes of the right SI joint are again noted. Vertebral body heights and alignment are maintained. Degenerative changes are noted anteriorly at T11-12. No focal lytic or blastic lesions are present. IMPRESSION: 1. Gastric bypass surgery without complication or acute abnormality. 2. Mild diffuse wall thickening in the sigmoid colon may represent inflammatory change. There is no adjacent inflammation in the mesenteries. No abscess or free air is present. 3. Hepatic steatosis. 4. Degenerative changes of the thoracolumbar spine and pelvis. Electronically Signed   By: [name] M.D.   On: 04/18/2016 09:04      [name] M 04/19/2016  Patient ID: [name], female   DOB: 05-11-1966, 50 y.o.   MRN: [id number]

## 2016-04-19 NOTE — Progress Notes (Signed)
Pt requested more pain medication because percocet is not relieving her pain enough. MD notified. MD does not want to give anymore pain medication. Therefore, pt wants to be discharged from hospital. MD notified. MD advised that pt would have to sign out AMA to leave. Pt advised/informed that she would have to sign out AMA to leave hospital. Pt agrees and wants to sign out AMA and go home.

## 2016-04-24 LAB — STOOL CULTURE: E coli, Shiga toxin Assay: NEGATIVE

## 2016-04-24 LAB — STOOL CULTURE REFLEX - RSASHR

## 2016-04-24 LAB — STOOL CULTURE REFLEX - CMPCXR

## 2016-04-25 ENCOUNTER — Telehealth: Payer: Self-pay | Admitting: Endocrinology

## 2016-04-25 NOTE — Telephone Encounter (Signed)
Did we receive the paperwork from [facility] for this pt  Call back if needed [phone] ext [phone] Ref # [id number]

## 2016-04-25 NOTE — Telephone Encounter (Signed)
Forms received and faxed on 04/25/2016.

## 2016-05-02 ENCOUNTER — Other Ambulatory Visit (HOSPITAL_COMMUNITY): Payer: Self-pay | Admitting: General Surgery

## 2016-05-02 DIAGNOSIS — R1310 Dysphagia, unspecified: Secondary | ICD-10-CM

## 2016-05-07 ENCOUNTER — Encounter: Payer: Medicare Other | Attending: General Surgery | Admitting: Skilled Nursing Facility1

## 2016-05-07 DIAGNOSIS — Z6841 Body Mass Index (BMI) 40.0 and over, adult: Secondary | ICD-10-CM | POA: Diagnosis not present

## 2016-05-07 DIAGNOSIS — E1142 Type 2 diabetes mellitus with diabetic polyneuropathy: Secondary | ICD-10-CM | POA: Diagnosis present

## 2016-05-07 DIAGNOSIS — I10 Essential (primary) hypertension: Secondary | ICD-10-CM | POA: Insufficient documentation

## 2016-05-07 NOTE — Patient Instructions (Addendum)
Fresh Start Bariatric [employer]: Healthy Recipes to [employer] After Weight-Loss Surgery By Meade Maw MS, RDN, CD   -Talk to your mental health doctor and Dr. Excell Seltzer about Anette Guarneri   -Try cold water after you eat your meal and stand up while you drink  -Keep it moving, try to go for walks throughout the week  -Half a banana with your first meat meal and then the other half with your second meat meal

## 2016-05-07 NOTE — Progress Notes (Signed)
Follow-up visit:  6 Weeks Post-Operative Roux-En-Y-Bypass Surgery  Medical Nutrition Therapy:  Appt start time: 9:00 end time:  9:45.  Primary concerns today: Post-operative Bariatric Surgery Nutrition Management. Pt states she had diarrhea for 7 days ended up in the ED and got on antibiotics due to an inflamed/swollen small intestine. Pt states she was not able to tolerate meats so she had a lot of vomiting because they felt like they got stuck so she kept drinking protein shakes. Pt states she was rehydrated through the hydration clinic. Pt states the last bought of vomiting/diarhhea was the beginning of November. Pt arrived with a bottle of water. Pt states she Cant tolerate chicken, sausage patty, steak, feels like it gets stuck and has to throw it up. Pt states she can tolerate Country style steak, chili, meat sauce, pork chop, egg, lunch meat, cheese, beans, bacon. Pt states her insulin was dropped to 100 units and she only takes her insulin if her glucose numbers are over 150 which they rarely are. Pt states she was taken off her simvastatin and another heart medicine she cannot remember the name of. Pt states her physchiatrist told her she needs to have a whole meal (more than just the meat or protein shake with her medicine) with her latuda.Pt states she has regular bowel movements.   Surgery date: October 3rd, 2017 Preferred Learning Style:   Auditory  Visual  Hands on   Learning Readiness:   Change in progress  TANITA  BODY COMP RESULTS 02/29/2016  02/04/16 04/08/2016 05/07/2016  39.1  BMI (kg/m^2) 39.1 36.9 34.5  99.01  Fat Mass (lbs) 99.8 88.6 82.2  108.01  Fat Free Mass (lbs) 107.2 106.8 100.4  77.8  Total Body Water (lbs) 77.2 76.4 71.4    24-hr recall: B (AM): protein shake (whole shake at once is tolerated) Snk 2 hours later (AM): 1 egg L (3PM): chili or grilled pork chop or country steak with most gravy pulled off Snk (PM): greek lemon yogurt D (PM): other half of  country style steak (1-2 ounces) Snk (PM):   Fluid intake: cold water hard to estimate but about 50-60 then the protein shakes is at least 64 ounces Estimated total protein intake: about 60  Medications: insulin, latuda, trazadone Supplementation: every day, calcium and multiday  CBG monitoring: once a day in the morning  Average CBG per patient: under 150 Last patient reported A1c: 6 something   Using straws: yes Drinking while eating: NO Hair loss: NO Carbonated beverages: NO N/V/D/C: Not since she had it taken care of  Dumping syndrome: NO  Recent physical activity:  Walking when she can  Progress Towards Goal(s):  In progress.   Nutritional Diagnosis:  [place]-3.3 Overweight/obesity related to past poor dietary habits and physical inactivity as evidenced by patient w/ recent RYB surgery following dietary guidelines for continued weight loss.    Intervention:  Nutrition counseling for obesity. Dietitian educated the pt on current recomendations and did not advance her diet to adding foods in addition to protein foods due to her trouble tolerating soft proteins currently. Dietitian did have her try a banana due to the cramping she is experiencing 3 days a week in her calves. If the bana does not help it may be a sodium issue.  Goals: Fresh Start Bariatric [employer]: Healthy Recipes to [employer] After Weight-Loss Surgery By Meade Maw MS, RDN, CD   -Talk to your mental health doctor and Dr. Excell Seltzer about Anette Guarneri   -Try cold  water after you eat your meal and stand up while you drink  -Keep it moving, try to go for walks throughout the week  -Half a banana with your first meat meal and then the other half with your second meat meal  Teaching Method Utilized:  Visual Auditory Hands on  Barriers to learning/adherence to lifestyle change: none identified   Demonstrated degree of understanding via:  Teach Back   Monitoring/Evaluation:  Dietary intake, exercise, lap  band fills, and body weight. Follow up in 4 weeks for 10 weeks post-op visit.

## 2016-05-11 NOTE — Progress Notes (Deleted)
   Subjective:    Patient ID: [name], female    DOB: 01-17-1966, 50 y.o.   MRN: [id number]  HPI Pt returns for f/u of diabetes mellitus: DM type: Insulin-requiring type 2 Dx'ed: [id number] Complications: polyneuropathy.    Therapy: insulin since 2013 GDM: never.   DKA: never.  Severe hypoglycemia: never.  Pancreatitis: never.  Other: she takes QD insulin, due to forgetting more frequent doses.   Interval history:  She had bariatric surgery 2 weeks ago.  She has lost 9 lbs so far.  She takes toujeo, 180 units qam.  She is having frequent mild hypoglycemia.    Review of Systems     Objective:   Physical Exam VITAL SIGNS:  See vs page GENERAL: no distress Pulses: dorsalis pedis intact bilat.   MSK: no deformity of the feet CV: no leg edema Skin:  no ulcer on the feet.  normal color and temp on the feet. Neuro: sensation is intact to touch on the feet       Assessment & Plan:  Insulin-requiring type 2 DM: overcontrolled, given this regimen, which does match insulin to her changing needs throughout the day.   Obesity, improved after surgery

## 2016-05-13 ENCOUNTER — Ambulatory Visit: Payer: Self-pay | Admitting: Endocrinology

## 2016-05-13 DIAGNOSIS — Z0289 Encounter for other administrative examinations: Secondary | ICD-10-CM

## 2016-06-11 ENCOUNTER — Encounter: Payer: Medicare Other | Attending: General Surgery | Admitting: Skilled Nursing Facility1

## 2016-06-11 ENCOUNTER — Encounter: Payer: Self-pay | Admitting: Skilled Nursing Facility1

## 2016-06-11 DIAGNOSIS — E119 Type 2 diabetes mellitus without complications: Secondary | ICD-10-CM

## 2016-06-11 DIAGNOSIS — E1142 Type 2 diabetes mellitus with diabetic polyneuropathy: Secondary | ICD-10-CM | POA: Diagnosis present

## 2016-06-11 DIAGNOSIS — I10 Essential (primary) hypertension: Secondary | ICD-10-CM | POA: Diagnosis present

## 2016-06-11 DIAGNOSIS — Z6841 Body Mass Index (BMI) 40.0 and over, adult: Secondary | ICD-10-CM | POA: Diagnosis not present

## 2016-06-11 NOTE — Patient Instructions (Addendum)
-  Make an appointment with Dr. Loanne Drilling   -Call Dr. Excell Seltzer and let him know you are eating fine now and let him decide if you need the GI look about   -Stay away from the fruit for now-Except for the banana   -Never eat more than 2 strips of bacon at a time   -When you have felt overly full that is your body telling you, you ate too much  -Stick with half a cup of beans   -You are aiming for 60 grams of protein a day  -1 Egg has 7 grams of protein and 2 slices of bacon has 7 grams of protein  -Aim for 64 fluid ounces a day (not just water counts)  -Listen to your fullness cues   -Always eat your protein first then your vegetable  -Look into the BELT program at [facility]: Get moving Girl!

## 2016-06-11 NOTE — Progress Notes (Signed)
   Medical Nutrition Therapy:  Appt start time: 0800 end time:  8:45  Primary concerns today: Post-operative Bariatric Surgery Nutrition Management. Pt states she no longer takes her blood pressure medicine. Pt states when she takes her insulin her sugar drops too low so she no longer takes it even if its 159. Pt states her latuda was increased and her doctor just said to eat what she could. Pt arrived with a bottle of water. Pt arrived . Pt states if she drinks before 30 minutes it hurst her stomach very badly. Pt states she drinks a gingerale when her sugar hits 80 because that is when she feels jittery. Pt states she had a very small slice of cake for her birthday. Pt states she had a handful of popcorn which settled fine. Pt states she has not been cramping since having a banana. Pt states she is feeling so much better! Pt arrives with good energy and moving quickly.   TANITA  BODY COMP RESULTS 02/29/2016  02/04/16 04/08/2016 05/07/2016 06/11/2016  39.1  BMI (kg/m^2) 39.1 36.9 34.5 32.6  99.01  Fat Mass (lbs) 99.8 88.6 82.2 71  108.01  Fat Free Mass (lbs) 107.2 106.8 100.4 101.6  77.8  Total Body Water (lbs) 77.2 76.4 71.4 71.8    Preferred Learning Style:   No preference indicated   Learning Readiness:   Change in progress  24-hr recall: B (AM): 1 premeir protein shake, 1 banana  Snk (AM):  5 strips of bacon or 2 boiled eggs with 1 strip of bacon L (PM): 2 slices of ham or half a large chili from wendys  Snk (PM): 5 pieces of block cheese D (PM): 1 hot dog Snk (PM):   Fluid intake: 16.9 ounces plain water or flavored 5-6 a day about 84 ounces, sometimes half a cup of gingergale Estimated total protein intake: about 116 grams  Medications: See List Supplementation: Multivitamin and Calcium (struggling to take all three due to remembering always egetting at least 2 every time) CBG monitoring: 125, 135, 159   Using straws: Yes Drinking while eating: NO Hair loss:  NO Carbonated beverages: YES N/V/D/C: NO, NO, NO, NO  Recent physical activity:  Walking her house  Progress Towards Goal(s):  In progress.

## 2016-09-10 ENCOUNTER — Ambulatory Visit: Payer: Self-pay | Admitting: Skilled Nursing Facility1

## 2016-09-11 ENCOUNTER — Encounter: Payer: Self-pay | Admitting: Skilled Nursing Facility1

## 2016-09-11 ENCOUNTER — Encounter: Payer: Medicare Other | Attending: General Surgery | Admitting: Skilled Nursing Facility1

## 2016-09-11 DIAGNOSIS — E119 Type 2 diabetes mellitus without complications: Secondary | ICD-10-CM

## 2016-09-11 DIAGNOSIS — Z029 Encounter for administrative examinations, unspecified: Secondary | ICD-10-CM | POA: Diagnosis present

## 2016-09-11 NOTE — Patient Instructions (Addendum)
-  Get a refill for the acid reducer medicine   -Keep getting in your fluid  -Try to keep your Oui yogurt to 2

## 2016-09-11 NOTE — Progress Notes (Addendum)
   Medical Nutrition Therapy:  Appt start time: 0800 end time:  8:45  Primary concerns today: Post-operative Bariatric Surgery Nutrition Management. Pt states she does not sleep at night due to severe pain and eats throughout the night. Trying to work on the pain managament. Pt state she wants to get to the gym and was going though a depression. Pt states she drinks ginegerale when her stomach is upset but cannot described the feeling that is happening or when exactly it happens.   Goals: -Get a refill for the acid reducer medicine  -Keep getting in your fluid -Try to keep your Oui yogurt to 2   Wt: 159.3.2 pounds Weight Change: 13 pounds lost  TANITA  BODY COMP RESULTS 02/29/2016  02/04/16 04/08/2016 05/07/2016 06/11/2016 09/11/2016  39.1  BMI (kg/m^2) 39.1 36.9 34.5 32.6 30.1  99.01  Fat Mass (lbs) 99.8 88.6 82.2 71 60.1  108.01  Fat Free Mass (lbs) 107.2 106.8 100.4 101.6 98.6  77.8  Total Body Water (lbs) 77.2 76.4 71.4 71.8 69.2    Preferred Learning Style:   No preference indicated   Learning Readiness:   Change in progress  24-hr recall: B (AM): 1 premeir protein shake, 1 banana  Snk (AM):  Oui yogurt L (PM): 1 ounce cubed steak (1g) Snk (PM): 5 pieces of block cheese---cabbage and dgreens D (PM): 1 hot dog Snk (PM):   Fluid intake: 16.9 ounces plain water or flavored 5-6 a day about 84 ounces, sometimes half a cup of gingergale Estimated total protein intake: about 116 grams  Medications: See List Supplementation: Multivitamin and Calcium (struggling to take all three due to remembering always getting at least 2 every time) CBG monitoring: 125, 135, 159: 180 once in the morning: pt states her sugars have stabilized.    Using straws: Yes Drinking while eating: NO Hair loss: YES but was losing it before  Carbonated beverages: YES N/V/D/C: NO, NO, NO, NO  Recent physical activity:  Walking her house  Progress Towards Goal(s):  In progress.

## 2016-10-22 ENCOUNTER — Ambulatory Visit: Payer: Self-pay | Admitting: Skilled Nursing Facility1

## 2016-10-23 ENCOUNTER — Encounter: Payer: Self-pay | Admitting: Skilled Nursing Facility1

## 2016-10-23 ENCOUNTER — Encounter: Payer: Medicare Other | Attending: General Surgery | Admitting: Skilled Nursing Facility1

## 2016-10-23 DIAGNOSIS — Z794 Long term (current) use of insulin: Secondary | ICD-10-CM

## 2016-10-23 DIAGNOSIS — E1142 Type 2 diabetes mellitus with diabetic polyneuropathy: Secondary | ICD-10-CM

## 2016-10-23 DIAGNOSIS — Z029 Encounter for administrative examinations, unspecified: Secondary | ICD-10-CM | POA: Insufficient documentation

## 2016-10-23 NOTE — Patient Instructions (Addendum)
-  Try my fitness pal to track your food  -Baritastic app  -Call BELT again and get started  -Aim for at least 4 bottles of water  -Celebrate multivitamin with iron chewable at the [place]
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
## 2016-11-26 ENCOUNTER — Ambulatory Visit: Payer: Self-pay | Admitting: Skilled Nursing Facility1

## 2016-12-25 ENCOUNTER — Other Ambulatory Visit: Payer: Self-pay | Admitting: Internal Medicine

## 2016-12-25 DIAGNOSIS — Z1231 Encounter for screening mammogram for malignant neoplasm of breast: Secondary | ICD-10-CM

## 2017-01-06 ENCOUNTER — Ambulatory Visit
Admission: RE | Admit: 2017-01-06 | Discharge: 2017-01-06 | Disposition: A | Discharge status: Home / Self Care | Payer: Medicare Other | Source: Ambulatory Visit | Attending: Internal Medicine | Admitting: Internal Medicine

## 2017-01-06 ENCOUNTER — Ambulatory Visit: Payer: Self-pay

## 2017-01-06 DIAGNOSIS — Z1231 Encounter for screening mammogram for malignant neoplasm of breast: Secondary | ICD-10-CM

## 2017-04-23 ENCOUNTER — Ambulatory Visit: Payer: Self-pay | Admitting: Surgery

## 2017-04-29 ENCOUNTER — Other Ambulatory Visit: Payer: Self-pay | Admitting: General Surgery

## 2017-04-29 DIAGNOSIS — R1013 Epigastric pain: Secondary | ICD-10-CM

## 2017-05-06 ENCOUNTER — Other Ambulatory Visit: Payer: Self-pay

## 2017-05-06 ENCOUNTER — Ambulatory Visit
Admission: RE | Admit: 2017-05-06 | Discharge: 2017-05-06 | Disposition: A | Discharge status: Home / Self Care | Payer: Medicare Other | Source: Ambulatory Visit | Attending: General Surgery | Admitting: General Surgery

## 2017-05-06 ENCOUNTER — Encounter (HOSPITAL_COMMUNITY): Payer: Self-pay | Admitting: *Deleted

## 2017-05-06 DIAGNOSIS — R1013 Epigastric pain: Secondary | ICD-10-CM

## 2017-05-12 ENCOUNTER — Ambulatory Visit (HOSPITAL_COMMUNITY): Payer: Medicare Other | Admitting: Certified Registered Nurse Anesthetist

## 2017-05-12 ENCOUNTER — Encounter (HOSPITAL_COMMUNITY): Payer: Self-pay

## 2017-05-12 ENCOUNTER — Ambulatory Visit (HOSPITAL_COMMUNITY)
Admission: RE | Admit: 2017-05-12 | Discharge: 2017-05-12 | Disposition: A | Discharge status: Home / Self Care | Payer: Medicare Other | Source: Ambulatory Visit | Attending: Surgery | Admitting: Surgery

## 2017-05-12 ENCOUNTER — Encounter (HOSPITAL_COMMUNITY)
Admission: RE | Disposition: A | Discharge status: Home / Self Care | Payer: Self-pay | Source: Ambulatory Visit | Attending: Surgery

## 2017-05-12 DIAGNOSIS — Z8261 Family history of arthritis: Secondary | ICD-10-CM | POA: Insufficient documentation

## 2017-05-12 DIAGNOSIS — Z811 Family history of alcohol abuse and dependence: Secondary | ICD-10-CM | POA: Insufficient documentation

## 2017-05-12 DIAGNOSIS — F17200 Nicotine dependence, unspecified, uncomplicated: Secondary | ICD-10-CM | POA: Diagnosis not present

## 2017-05-12 DIAGNOSIS — K289 Gastrojejunal ulcer, unspecified as acute or chronic, without hemorrhage or perforation: Secondary | ICD-10-CM | POA: Diagnosis not present

## 2017-05-12 DIAGNOSIS — Z8249 Family history of ischemic heart disease and other diseases of the circulatory system: Secondary | ICD-10-CM | POA: Diagnosis not present

## 2017-05-12 DIAGNOSIS — Z8349 Family history of other endocrine, nutritional and metabolic diseases: Secondary | ICD-10-CM | POA: Diagnosis not present

## 2017-05-12 DIAGNOSIS — M2550 Pain in unspecified joint: Secondary | ICD-10-CM | POA: Insufficient documentation

## 2017-05-12 DIAGNOSIS — Z886 Allergy status to analgesic agent status: Secondary | ICD-10-CM | POA: Insufficient documentation

## 2017-05-12 DIAGNOSIS — F419 Anxiety disorder, unspecified: Secondary | ICD-10-CM | POA: Insufficient documentation

## 2017-05-12 DIAGNOSIS — Z818 Family history of other mental and behavioral disorders: Secondary | ICD-10-CM | POA: Diagnosis not present

## 2017-05-12 DIAGNOSIS — Z9884 Bariatric surgery status: Secondary | ICD-10-CM | POA: Insufficient documentation

## 2017-05-12 DIAGNOSIS — R1013 Epigastric pain: Secondary | ICD-10-CM | POA: Insufficient documentation

## 2017-05-12 DIAGNOSIS — Z820 Family history of epilepsy and other diseases of the nervous system: Secondary | ICD-10-CM | POA: Diagnosis not present

## 2017-05-12 DIAGNOSIS — Z90710 Acquired absence of both cervix and uterus: Secondary | ICD-10-CM | POA: Diagnosis not present

## 2017-05-12 HISTORY — DX: Dyspnea, unspecified: R06.00

## 2017-05-12 HISTORY — PX: ESOPHAGOGASTRODUODENOSCOPY (EGD) WITH PROPOFOL: SHX5813

## 2017-05-12 SURGERY — ESOPHAGOGASTRODUODENOSCOPY (EGD) WITH PROPOFOL
Anesthesia: Monitor Anesthesia Care

## 2017-05-12 MED ORDER — LACTATED RINGERS IV SOLN
INTRAVENOUS | Status: DC | PRN
  Administered 2017-05-12: 14:00:00 via INTRAVENOUS

## 2017-05-12 MED ORDER — LIDOCAINE 2% (20 MG/ML) 5 ML SYRINGE
INTRAMUSCULAR | Status: DC | PRN
  Administered 2017-05-12: 80 mg via INTRAVENOUS

## 2017-05-12 MED ORDER — PANTOPRAZOLE SODIUM 40 MG PO TBEC: 40.0000 mg | DELAYED_RELEASE_TABLET | Freq: Two times a day (BID) | ORAL | 2 refills | Status: DC

## 2017-05-12 MED ORDER — PROPOFOL 10 MG/ML IV BOLUS
INTRAVENOUS | Status: AC
  Filled 2017-05-12: qty 40

## 2017-05-12 MED ORDER — PROPOFOL 500 MG/50ML IV EMUL
INTRAVENOUS | Status: DC | PRN
Start: 2017-05-12 — End: 2017-05-12
  Administered 2017-05-12: 150 ug/kg/min via INTRAVENOUS

## 2017-05-12 MED ORDER — PROPOFOL 10 MG/ML IV BOLUS
INTRAVENOUS | Status: DC | PRN
  Administered 2017-05-12: 30 mg via INTRAVENOUS

## 2017-05-12 MED ORDER — LIDOCAINE 2% (20 MG/ML) 5 ML SYRINGE
INTRAMUSCULAR | Status: AC
  Filled 2017-05-12: qty 5

## 2017-05-12 MED ORDER — ONDANSETRON HCL 4 MG/2ML IJ SOLN
INTRAMUSCULAR | Status: AC
  Filled 2017-05-12: qty 2

## 2017-05-12 MED ORDER — ONDANSETRON HCL 4 MG/2ML IJ SOLN
INTRAMUSCULAR | Status: DC | PRN
  Administered 2017-05-12: 4 mg via INTRAVENOUS

## 2017-05-12 MED ORDER — SODIUM CHLORIDE 0.9 % IV SOLN: INTRAVENOUS | Status: DC

## 2017-05-12 SURGICAL SUPPLY — 15 items

## 2017-05-12 NOTE — Interval H&P Note (Signed)
History and Physical Interval Note:  05/12/2017 1:35 PM  [name]  has presented today for surgery, with the diagnosis of history of gastric bypass, pain with eating  The various methods of treatment have been discussed with the patient and family.   Husband with patient.  After consideration of risks, benefits and other options for treatment, the patient has consented to  Procedure(s): ESOPHAGOGASTRODUODENOSCOPY (EGD) WITH PROPOFOL (N/A) as a surgical intervention .  The patient's history has been reviewed, patient examined, no change in status, stable for surgery.  I have reviewed the patient's chart and labs.  Questions were answered to the patient's satisfaction.     Shann Medal

## 2017-05-12 NOTE — Discharge Instructions (Signed)
[facility] - DISCHARGE INSTRUCTIONS TO PATIENT  Activity:  Driving - May drive tomorrow  Diet:  Bariatric diet, as tolerated.  Follow up appointment:  Call Dr. Lear Ng office [facility]) at [phone] for an appointment in 4 to 6 weeks.  Medications and dosages:  Resume your home medications.  You have a prescription for:  Protonix  Call Dr. Excell Seltzer or his office  [phone]) if you have:  Temperature greater than 100.4,  Persistent nausea and vomiting,  Severe uncontrolled pain,  Any other questions or concerns you may have after discharge.  In an emergency, call 911 or go to an Emergency Department at a nearby hospital.

## 2017-05-12 NOTE — Anesthesia Procedure Notes (Signed)
Procedure Name: MAC Date/Time: 05/12/2017 2:38 PM Performed by: West Pugh, CRNA Pre-anesthesia Checklist: Patient identified, Emergency Drugs available, Suction available, Patient being monitored and Timeout performed Patient Re-evaluated:Patient Re-evaluated prior to induction Oxygen Delivery Method: Nasal cannula Placement Confirmation: positive ETCO2,  breath sounds checked- equal and bilateral and CO2 detector Dental Injury: Teeth and Oropharynx as per pre-operative assessment

## 2017-05-12 NOTE — Transfer of Care (Signed)
Immediate Anesthesia Transfer of Care Note  Patient: [name]  Procedure(s) Performed: ESOPHAGOGASTRODUODENOSCOPY (EGD) WITH PROPOFOL (N/A )  Patient Location: PACU  Anesthesia Type:MAC  Level of Consciousness: awake, alert , oriented and patient cooperative  Airway & Oxygen Therapy: Patient Spontanous Breathing and Patient connected to nasal cannula oxygen  Post-op Assessment: Report given to RN, Post -op Vital signs reviewed and stable and Patient moving all extremities X 4  Post vital signs: Reviewed and stable  Last Vitals:  Vitals:   05/12/17 1310  BP: 119/77  Pulse: 70  Resp: 13  Temp: 36.8 C  SpO2: 95%    Last Pain:  Vitals:   05/12/17 1310  TempSrc: Oral  PainSc: 7          Complications: No apparent anesthesia complications

## 2017-05-12 NOTE — Anesthesia Preprocedure Evaluation (Signed)
Anesthesia Evaluation  Patient identified by MRN, date of birth, ID band Patient awake    Reviewed: Allergy & Precautions, NPO status , Patient's Chart, lab work & pertinent test results  Airway Mallampati: I  TM Distance: >3 FB Neck ROM: Full    Dental   Pulmonary COPD, Current Smoker,    Pulmonary exam normal        Cardiovascular hypertension, Pt. on medications Normal cardiovascular exam     Neuro/Psych Anxiety Depression Schizophrenia    GI/Hepatic   Endo/Other  diabetes, Type 2  Renal/GU      Musculoskeletal   Abdominal   Peds  Hematology   Anesthesia Other Findings   Reproductive/Obstetrics                             Anesthesia Physical Anesthesia Plan  ASA: III  Anesthesia Plan: MAC   Post-op Pain Management:    Induction: Intravenous  PONV Risk Score and Plan: 1 and Ondansetron  Airway Management Planned: Simple Face Mask  Additional Equipment:   Intra-op Plan:   Post-operative Plan:   Informed Consent: I have reviewed the patients History and Physical, chart, labs and discussed the procedure including the risks, benefits and alternatives for the proposed anesthesia with the patient or authorized representative who has indicated his/her understanding and acceptance.     Plan Discussed with: CRNA and Surgeon  Anesthesia Plan Comments:         Anesthesia Quick Evaluation

## 2017-05-12 NOTE — H&P (Signed)
[name]  Location: [facility] Patient #: [id number] DOB: 04/17/1966 Single / Language: [place] / Race: Native [place] or Other [place] Female  History of Present Illness   The patient is a 51 year old female presenting status-post bariatric surgery. She returns now 1 year laparoscopic Roux-en-Y gastric bypass (03/25/2016 - Dr. Excell Seltzer) for morbid obesity and multiple comorbidities. I have not seen her since her 3 month follow-up. She has had excellent ongoing weight loss with total weight loss is 68 pounds and current BMI of 26.5 from preoperative 39.1. She has had resolution of diabetes, hypertension, GERD and dyslipidemia with improvement in COPD and some improvement in her ongoing chronic pain issues.   However she states that for several months she has had pain and nausea after meals. It is now making her afraid. She describes pressure discomfort in her epigastrium and nausea after eating. She is also having occasional aching pain in her right flank and also in both eyes. She does have chronic back and joint pain issues. No vomiting. She is not taking any NSAIDs.  Abdominal US on 05/06/2017 showed no cholelithiasis.  She has a history of a colonoscopy by Dr. Benson Norway in 2016.  She was seen by Dr. Paulita Fujita on 04/19/2016 for nausea and vomiting after gastric bypass.  Past Medical History: 1.  Cystocele repair - 03/04/2011 - MacDiarmid 2.  Vaginal hysterectomy - 03/04/2011 - Grewal 3.  Pain issues - on Fentanyl patch and takes Lyrica  Problem List/Past Medical Marland Kitchen T. Hoxworth, MD; 04/23/2017 11:24 AM) GASTRIC BYPASS STATUS FOR OBESITY (Z98.84)  DYSPHAGIA (R13.10)  MALNUTRITION FOLLOWING GASTROINTESTINAL SURGERY (K91.2)  OBESITY, MORBID, BMI 40.0-49.9 (E66.01)  EPIGASTRIC PAIN (R10.13)   Past Surgical History Sharyn Lull R. Franchino, CMA; 04/23/2017 11:11 AM) Knee Surgery  Right.  Diagnostic Studies History Sharyn Lull R. Zuidema, CMA; 04/23/2017 11:11  AM) Colonoscopy  1-5 years ago Mammogram  1-3 years ago  Allergies Sharyn Lull R. Palen, CMA; 04/23/2017 11:11 AM) Aspirin *ANALGESICS - NonNarcotic*   Medication History (Michelle R. Altergott, CMA; 04/23/2017 11:11 AM) FentaNYL (Transdermal) Specific strength unknown - Active. HYDROmorphone HCl (Oral) Specific strength unknown - Active. Iron (Ferrous Gluconate) (Oral) Specific strength unknown - Active. Calcium (Oral) Specific strength unknown - Active.  Social History Sharyn Lull R. Dearmas, CMA; 04/23/2017 11:11 AM) Alcohol use  Remotely quit alcohol use. Illicit drug use  Remotely quit drug use. Tobacco use  Former smoker.  Family History Sharyn Lull R. Decock, CMA; 04/23/2017 11:12 AM) Arthritis  Mother, Sister. Depression  Brother, Daughter, Mother. Heart Disease  Father, Sister. Alcohol Abuse  Brother. Hypertension  Brother, Father, Mother, Sister. Migraine Headache  Daughter, Mother, Sister. Thyroid problems  Mother.  Pregnancy / Birth History Sharyn Lull R. Harrel, CMA; 04/23/2017 11:12 AM) Para  3 Gravida  3 Maternal age  43-20  Other Problems Marland Kitchen T. Hoxworth, MD; 04/23/2017 11:24 AM) Oophorectomy  Right. Thyroid Disease  Migraine Headache  High blood pressure  Home Oxygen Use  Hypercholesterolemia  Chronic Obstructive Lung Disease  Diabetes Mellitus  Gastroesophageal Reflux Disease  Back Pain  Anxiety Disorder   Vitals Sharyn Lull R. Jenkinson CMA; 04/23/2017 11:08 AM) 04/23/2017 11:07 AM Weight: 144.13 lb Height: 61.75in Body Surface Area: 1.66 m Body Mass Index: 26.57 kg/m  Pulse: 74 (Regular)  BP: 126/82 (Sitting, Left Arm, Standard)   Physical Exam  General: Alert and in no distress Skin: No rash or infection Lymph nodes: No cervical or supraclavicular or inguinal nodes palpable Lungs: Clear equal breath sounds Cardiac: Regular rate and rhythm,  no edema Abdomen: Moderate pannus. No rash or infection. Well-healed  incisions. Moderate epigastric and slight right upper quadrant tenderness. No palpable masses. No organomegaly. No hernias. Extremities: No edema or calf tenderness Neurologic: Alert and fully oriented. Affect normal.   Assessment & Plan  1.  GASTRIC BYPASS STATUS FOR OBESITY (Z98.84)  Impression: 1 year status post gastric bypass with overall excellent weight loss in excellent resolution of multiple comorbidities. We will check routine lab work. She does however have epigastric pain as below.  2.  EPIGASTRIC PAIN (R10.13)  Impression: Pain and nausea post bypass. Multiple possibilities include cholelithiasis, ulcers or stenosis. I will obtain a gallbladder ultrasound. We will ask Dr. Lucia Gaskins to consider upper endoscopy to rule out ulcer or stenosis. I will be in touch with her following these studies regarding her treatment plans. We'll give her a default appointment for 1 year.   Alphonsa Overall, MD, [facility] Pager: [phone] Office phone:  [phone]

## 2017-05-12 NOTE — Op Note (Signed)
05/12/2017  2:04 PM  PATIENT:  [name], 51 y.o., female, MRN: [id number]  PREOP DIAGNOSIS:  Gastric bypass, epigastric discomfort  POSTOP DIAGNOSIS:   Gastric bypass, small marginal ulcer at [place] junction [Photos in Epic]  PROCEDURE:  Esophagogastrojejunoscopy  SURGEON:   Alphonsa Overall, M.D.  ANESTHESIA:   Propofol by anesthesia  INDICATIONS FOR PROCEDURE:  [name] is a 51 y.o. (DOB: 02/17/1966)  AA female whose primary care physician is [name], MD and comes for upper endoscopy to evaluate gastric bypass.  The patient had a RYGB on 03/25/2016 by Dr. Excell Seltzer.   The indications and risks of the endoscopy were explained to the patient.  The risks include, but are not limited to, perforation, bleeding, or injury to the bowel.  If balloon dilatation is needed, the risk of perforation is higher.  PROCEDURE:  The patient was in room 4 at [facility] endoscopy unit.  The patient was monitored with a pulse oximetry, BP cuff, and EKG.  The patient has nasal O2 flowing during the procedure.  Anesthesia provided sedation.   A time was held prior to the procedure.   The patient was positioned in the left lateral decubitus position.    A flexible Pentax endoscope was passed down the throat without difficulty.   Findings include:   Esophagus:   Normal   GE junction at:  38 cm   Stomach pouch: Normal   Gastrojejunal anastomosis:   44 cm There is a small ulcer at the anastomosis.   Efferent jejunal limb:  Normal to 60 cm Afferent jejunal limb:  Normal   CLO test:  Yes  PLAN:   Photos taken and given to patient.   Will start Protonix 40 mg BID.   She needs to stop smoking.  Alphonsa Overall, MD, [facility] Pager: [phone] Office phone:  [phone]

## 2017-05-12 NOTE — Anesthesia Postprocedure Evaluation (Signed)
Anesthesia Post Note  Patient: [name]  Procedure(s) Performed: ESOPHAGOGASTRODUODENOSCOPY (EGD) WITH PROPOFOL (N/A )     Patient location during evaluation: PACU Anesthesia Type: MAC Level of consciousness: awake and alert Pain management: pain level controlled Vital Signs Assessment: post-procedure vital signs reviewed and stable Respiratory status: spontaneous breathing, nonlabored ventilation, respiratory function stable and patient connected to nasal cannula oxygen Cardiovascular status: stable and blood pressure returned to baseline Postop Assessment: no apparent nausea or vomiting Anesthetic complications: no    Last Vitals:  Vitals:   05/12/17 1420 05/12/17 1427  BP: 137/69 136/62  Pulse: 66 67  Resp: 18 16  Temp:    SpO2: 100% 100%    Last Pain:  Vitals:   05/12/17 1427  TempSrc:   PainSc: 7                  Neely Kammerer DAVID

## 2017-05-13 ENCOUNTER — Encounter (HOSPITAL_COMMUNITY): Payer: Self-pay | Admitting: Surgery

## 2017-05-13 LAB — CLOTEST (H. PYLORI), BIOPSY: HELICOBACTER SCREEN: NEGATIVE

## 2017-06-08 ENCOUNTER — Encounter (HOSPITAL_COMMUNITY): Payer: Self-pay | Admitting: Emergency Medicine

## 2017-06-08 ENCOUNTER — Emergency Department (HOSPITAL_COMMUNITY)
Admission: EM | Admit: 2017-06-08 | Discharge: 2017-06-08 | Disposition: A | Discharge status: Home / Self Care | Payer: Medicare Other | Attending: Emergency Medicine | Admitting: Emergency Medicine

## 2017-06-08 DIAGNOSIS — H538 Other visual disturbances: Secondary | ICD-10-CM | POA: Diagnosis not present

## 2017-06-08 DIAGNOSIS — R110 Nausea: Secondary | ICD-10-CM | POA: Diagnosis not present

## 2017-06-08 DIAGNOSIS — G43819 Other migraine, intractable, without status migrainosus: Secondary | ICD-10-CM | POA: Insufficient documentation

## 2017-06-08 DIAGNOSIS — E119 Type 2 diabetes mellitus without complications: Secondary | ICD-10-CM | POA: Diagnosis not present

## 2017-06-08 DIAGNOSIS — J449 Chronic obstructive pulmonary disease, unspecified: Secondary | ICD-10-CM | POA: Diagnosis not present

## 2017-06-08 DIAGNOSIS — F17210 Nicotine dependence, cigarettes, uncomplicated: Secondary | ICD-10-CM | POA: Insufficient documentation

## 2017-06-08 DIAGNOSIS — R51 Headache: Secondary | ICD-10-CM | POA: Diagnosis present

## 2017-06-08 MED ORDER — DIPHENHYDRAMINE HCL 50 MG/ML IJ SOLN
25.0000 mg | Freq: Once | INTRAMUSCULAR | Status: AC
  Administered 2017-06-08: 25 mg via INTRAVENOUS
  Filled 2017-06-08: qty 1

## 2017-06-08 MED ORDER — METOCLOPRAMIDE HCL 5 MG/ML IJ SOLN
10.0000 mg | Freq: Once | INTRAMUSCULAR | Status: AC
  Administered 2017-06-08: 10 mg via INTRAVENOUS
  Filled 2017-06-08: qty 2

## 2017-06-08 MED ORDER — KETOROLAC TROMETHAMINE 30 MG/ML IJ SOLN
30.0000 mg | Freq: Once | INTRAMUSCULAR | Status: AC
  Administered 2017-06-08: 30 mg via INTRAVENOUS
  Filled 2017-06-08: qty 1

## 2017-06-08 MED ORDER — SODIUM CHLORIDE 0.9 % IV BOLUS (SEPSIS)
1000.0000 mL | Freq: Once | INTRAVENOUS | Status: AC
  Administered 2017-06-08: 1000 mL via INTRAVENOUS

## 2017-06-08 MED ORDER — ONDANSETRON 4 MG PO TBDP: 4.0000 mg | ORAL_TABLET | Freq: Three times a day (TID) | ORAL | 0 refills | Status: DC | PRN

## 2017-06-08 NOTE — Discharge Instructions (Signed)
Prescription for nausea medication.  Rest.  Increase fluids.

## 2017-06-08 NOTE — ED Provider Notes (Signed)
[facility] DEPT Provider Note   CSN: [id number] Arrival date & time: 06/08/17  9432     History   Chief Complaint Chief Complaint  Patient presents with  . Migraine    HPI [name] is a 51 y.o. female.  Bitemporal migraine headache since last night.  Patient tried her sumatriptan injection and it did not seem to help.  She has been plagued with migraine headaches for a long time.  This feels similar to her normal migraines.  No gross neurological deficits, fever, sweats, chills, stiff neck.  Severity of pain is moderate.  Light makes pain worse.      Past Medical History:  Diagnosis Date  . Abdominal pain   . Accidental drug overdose April 2013  . Anxiety   . Atrial fibrillation ([place]) 09/29/11   converted spontaneously  . Chronic back pain   . Chronic knee pain   . Chronic nausea   . Chronic pain   . COPD (chronic obstructive pulmonary disease) ([place])   . Depression   . Diabetes mellitus    states her doctor took her off all DM meds in past month  . Diabetic neuropathy ([place])   . Dyspnea    with exertion   . GERD (gastroesophageal reflux disease)   . Headache(784.0)    migraines   . HTN (hypertension)    not on meds since in a year   . Hyperlipidemia   . Hypothyroidism    not on meds in a while   . Mental disorder    Bipolar and schizophrenic  . Requires supplemental oxygen    as needed per patient   . Schizophrenia ([place])   . Tobacco abuse     Patient Active Problem List   Diagnosis Date Noted  . Abdominal pain 04/18/2016  . Dehydration 04/17/2016  . Morbid obesity ([place]) 03/25/2016  . Type 2 diabetes mellitus with diabetic polyneuropathy, with long-term current use of insulin ([place]) 09/06/2015  . Vaginitis and vulvovaginitis, unspecified 04/19/2013  . BV (bacterial vaginosis) 04/19/2013  . HTN (hypertension) 11/04/2011  . Hypokalemia 10/02/2011  . atrial fibrillation 09/30/2011  . Acute respiratory failure ([place])  09/29/2011  . COPD (chronic obstructive pulmonary disease) ([place]) 09/29/2011  . Accidental drug overdose 09/29/2011    Past Surgical History:  Procedure Laterality Date  . ABDOMINAL HYSTERECTOMY    . BLADDER SUSPENSION  03/04/2011   Procedure: [facility] PROCEDURE;  Surgeon: Elayne Snare MacDiarmid;  Location: [facility];  Service: Urology;  Laterality: N/A;  . CYSTOCELE REPAIR  03/04/2011   Procedure: ANTERIOR REPAIR (CYSTOCELE);  Surgeon: Reece Packer;  Location: [facility];  Service: Urology;  Laterality: N/A;  . CYSTOSCOPY  03/04/2011   Procedure: CYSTOSCOPY;  Surgeon: Elayne Snare MacDiarmid;  Location: [facility];  Service: Urology;  Laterality: N/A;  . ESOPHAGOGASTRODUODENOSCOPY (EGD) WITH PROPOFOL N/A 05/12/2017   Procedure: ESOPHAGOGASTRODUODENOSCOPY (EGD) WITH PROPOFOL;  Surgeon: Alphonsa Overall, MD;  Location: [facility];  Service: General;  Laterality: N/A;  . GASTRIC ROUX-EN-Y N/A 03/25/2016   Procedure: LAPAROSCOPIC ROUX-EN-Y GASTRIC BYPASS WITH UPPER ENDOSCOPY;  Surgeon: Excell Seltzer, MD;  Location: [facility];  Service: General;  Laterality: N/A;  . KNEE SURGERY    . LAPAROSCOPIC ASSISTED VAGINAL HYSTERECTOMY  03/04/2011   Procedure: LAPAROSCOPIC ASSISTED VAGINAL HYSTERECTOMY;  Surgeon: Cyril Mourning, MD;  Location: [facility];  Service: Gynecology;  Laterality: N/A;    OB History    Gravida Para Term Preterm AB Living   3 3  0 3   SAB TAB Ectopic Multiple Live Births   0 0 0 0         Home Medications    Prior to Admission medications   Medication Sig Start Date End Date Taking? Authorizing Provider  calcium carbonate (OS-CAL) 1250 (500 Ca) MG chewable tablet Chew 1 tablet 2 (two) times daily by mouth.   Yes [provider]  carisoprodol (SOMA) 350 MG tablet Take 350 mg 3 (three) times daily as needed by mouth for muscle spasms.  04/30/17  Yes [provider]  cycloSPORINE (RESTASIS) 0.05 % ophthalmic emulsion Place 1 drop into both eyes 2 (two) times daily.   Yes  [provider]  diclofenac sodium (VOLTAREN) 1 % GEL Apply 4 g 4 (four) times daily as needed topically (for pain).   Yes [provider]  fentaNYL (DURAGESIC - DOSED MCG/HR) 100 MCG/HR Place 100 mcg every 3 (three) days onto the skin.   Yes [provider]  HYDROmorphone (DILAUDID) 8 MG tablet Take 8 mg every 6 (six) hours as needed by mouth for severe pain.    Yes [provider]  ibuprofen (ADVIL,MOTRIN) 800 MG tablet Take 800 mg every 8 (eight) hours as needed by mouth for headache or moderate pain.    Yes [provider]  Multiple Vitamins-Iron (MULTIVITAMIN/IRON PO) Take 1 tablet 2 (two) times daily by mouth.   Yes [provider]  pregabalin (LYRICA) 100 MG capsule Take 100 mg 3 (three) times daily by mouth.    Yes [provider]  SUMAtriptan 6 MG/0.5ML SOAJ Inject 6 mg daily as needed into the muscle (for migraines).  06/09/15  Yes [provider]  acetaminophen (TYLENOL) 160 MG/5ML liquid Take 10 mLs (320 mg total) by mouth every 4 (four) hours as needed for fever. Patient not taking: Reported on 04/09/2016 03/27/16   Kinsinger, Arta Bruce, MD  albuterol [facility] HFA) [phone] Base) MCG/ACT inhaler Inhale 2 puffs into the lungs every 6 (six) hours as needed for wheezing or shortness of breath.    [provider]  Dextrose-Fructose-Sod Citrate Almon Hercules) [phone] MG CHEW Chew 3 tablets as needed by mouth (for nausea and upset stomach).    [provider]  hydrOXYzine (ATARAX/VISTARIL) 50 MG tablet Take 50 mg by mouth 3 (three) times daily as needed for itching.  01/14/16   [provider]  NARCAN 4 MG/0.1ML LIQD nasal spray kit  05/30/17   [provider]  ondansetron (ZOFRAN ODT) 4 MG disintegrating tablet Take 1 tablet (4 mg total) by mouth every 8 (eight) hours as needed for nausea or vomiting. 06/08/17   Nat Christen, MD  pantoprazole (PROTONIX) 40 MG tablet Take 1 tablet (40 mg total)  by mouth 2 (two) times daily. Patient not taking: Reported on 06/08/2017 05/12/17   Alphonsa Overall, MD  Polyethyl Glycol-Propyl Glycol (SYSTANE) 0.4-0.3 % GEL ophthalmic gel Place 1 application as needed into both eyes (for dry eyes).    [provider]  promethazine (PHENERGAN) 25 MG tablet Take 1 tablet (25 mg total) by mouth every 6 (six) hours as needed for nausea or vomiting. 04/14/16   Long, Wonda Olds, MD  SPIRIVA HANDIHALER 18 MCG inhalation capsule Place 1 puff daily as needed into inhaler and inhale (for shortness of breath or wheezing).  04/08/16   [provider]  tiZANidine (ZANAFLEX) 4 MG tablet Take 4 mg 2 (two) times daily as needed by mouth for muscle spasms.    [provider]    Family History Family History  Problem Relation Age of Onset  . Heart attack Father        65s  . Diabetes Mother   . Heart disease Mother   . Hypertension Mother   . Heart attack Sister        2  . COPD Other   . Breast cancer Neg Hx     Social History Social History   Tobacco Use  . Smoking status: Current Every Day Smoker    Packs/day: 0.50    Types: Cigarettes  . Smokeless tobacco: Never Used  Substance Use Topics  . Alcohol use: No  . Drug use: No     Allergies   Morphine and related and Aspirin   Review of Systems Review of Systems  All other systems reviewed and are negative.    Physical Exam Updated Vital Signs BP (!) 147/84   Pulse 92   Temp 97.9 F (36.6 C) (Oral)   Resp 18   LMP 01/08/2011   SpO2 98%   Physical Exam  Constitutional: She is oriented to person, place, and time. She appears well-developed and well-nourished.  HENT:  Head: Normocephalic and atraumatic.  Eyes: Conjunctivae are normal.  Neck: Neck supple.  Cardiovascular: Normal rate and regular rhythm.  Pulmonary/Chest: Effort normal and breath sounds normal.  Abdominal: Soft. Bowel sounds are normal.  Musculoskeletal: Normal range of motion.  Neurological: She  is alert and oriented to person, place, and time.  Photophobic  Skin: Skin is warm and dry.  Psychiatric: She has a normal mood and affect. Her behavior is normal.  Nursing note and vitals reviewed.    ED Treatments / Results  Labs (all labs ordered are listed, but only abnormal results are displayed) Labs Reviewed - No data to display  EKG  EKG Interpretation None       Radiology No results found.  Procedures Procedures (including critical care time)  Medications Ordered in ED Medications  sodium chloride 0.9 % bolus 1,000 mL (0 mLs Intravenous Stopped 06/08/17 1306)  diphenhydrAMINE (BENADRYL) injection 25 mg (25 mg Intravenous Given 06/08/17 1047)  ketorolac (TORADOL) 30 MG/ML injection 30 mg (30 mg Intravenous Given 06/08/17 1046)  metoCLOPramide (REGLAN) injection 10 mg (10 mg Intravenous Given 06/08/17 1046)     Initial Impression / Assessment and Plan / ED Course  I have reviewed the triage vital signs and the nursing notes.  Pertinent labs & imaging results that were available during my care of the patient were reviewed by me and considered in my medical decision making (see chart for details).     Patient feels much better after IV fluids, IV Toradol, IV Reglan, IV Benadryl.  Discharge medications Zofran 4 mg ODT  Final Clinical Impressions(s) / ED Diagnoses   Final diagnoses:  Other migraine without status migrainosus, intractable    ED Discharge Orders        Ordered    ondansetron (ZOFRAN ODT) 4 MG disintegrating tablet  Every 8 hours PRN     06/08/17 1407       Nat Christen, MD 06/08/17 1413

## 2017-06-08 NOTE — ED Triage Notes (Signed)
Patient c/o migraine that either started last night or this morning patient isn't sure when.  Has PMH migraines. Reports blurred vision and nausea

## 2017-06-13 ENCOUNTER — Emergency Department (HOSPITAL_COMMUNITY)
Admission: EM | Admit: 2017-06-13 | Discharge: 2017-06-13 | Disposition: A | Discharge status: Home / Self Care | Payer: Medicare Other | Attending: Emergency Medicine | Admitting: Emergency Medicine

## 2017-06-13 ENCOUNTER — Encounter (HOSPITAL_COMMUNITY): Payer: Self-pay | Admitting: Emergency Medicine

## 2017-06-13 ENCOUNTER — Other Ambulatory Visit: Payer: Self-pay

## 2017-06-13 DIAGNOSIS — Z9884 Bariatric surgery status: Secondary | ICD-10-CM | POA: Insufficient documentation

## 2017-06-13 DIAGNOSIS — M62838 Other muscle spasm: Secondary | ICD-10-CM

## 2017-06-13 DIAGNOSIS — E119 Type 2 diabetes mellitus without complications: Secondary | ICD-10-CM | POA: Insufficient documentation

## 2017-06-13 DIAGNOSIS — F209 Schizophrenia, unspecified: Secondary | ICD-10-CM | POA: Insufficient documentation

## 2017-06-13 DIAGNOSIS — F17210 Nicotine dependence, cigarettes, uncomplicated: Secondary | ICD-10-CM | POA: Diagnosis not present

## 2017-06-13 DIAGNOSIS — Z79899 Other long term (current) drug therapy: Secondary | ICD-10-CM | POA: Insufficient documentation

## 2017-06-13 DIAGNOSIS — E039 Hypothyroidism, unspecified: Secondary | ICD-10-CM | POA: Diagnosis not present

## 2017-06-13 DIAGNOSIS — I10 Essential (primary) hypertension: Secondary | ICD-10-CM | POA: Diagnosis not present

## 2017-06-13 DIAGNOSIS — J449 Chronic obstructive pulmonary disease, unspecified: Secondary | ICD-10-CM | POA: Diagnosis not present

## 2017-06-13 DIAGNOSIS — M542 Cervicalgia: Secondary | ICD-10-CM

## 2017-06-13 DIAGNOSIS — F419 Anxiety disorder, unspecified: Secondary | ICD-10-CM | POA: Insufficient documentation

## 2017-06-13 DIAGNOSIS — Z794 Long term (current) use of insulin: Secondary | ICD-10-CM | POA: Diagnosis not present

## 2017-06-13 DIAGNOSIS — F319 Bipolar disorder, unspecified: Secondary | ICD-10-CM | POA: Insufficient documentation

## 2017-06-13 MED ORDER — CYCLOBENZAPRINE HCL 10 MG PO TABS: 10.0000 mg | ORAL_TABLET | Freq: Every evening | ORAL | 0 refills | Status: DC | PRN

## 2017-06-13 MED ORDER — CYCLOBENZAPRINE HCL 10 MG PO TABS
10.0000 mg | ORAL_TABLET | Freq: Once | ORAL | Status: AC
  Administered 2017-06-13: 10 mg via ORAL
  Filled 2017-06-13: qty 1

## 2017-06-13 NOTE — Discharge Instructions (Signed)
Please take Ibuprofen (Advil, motrin) and Tylenol (acetaminophen) to relieve your pain.  You may take up to 600 MG (3 pills) of normal strength ibuprofen every 8 hours as needed.  In between doses of ibuprofen you make take tylenol, up to 1,000 mg (two extra strength pills).  Do not take more than 3,000 mg tylenol in a 24 hour period.  Please check all medication labels as many medications such as pain and cold medications may contain tylenol.  Do not drink alcohol while taking these medications.  Do not take other NSAID'S while taking ibuprofen (such as aleve or naproxen).  Please take ibuprofen with food to decrease stomach upset.  The best way to get rid of muscle pain is by taking NSAIDS, using heat, massage therapy, and gentle stretching/range of motion exercises.  Today you received medications that may make you sleepy or impair your ability to make decisions.  For the next 24 hours please do not drive, operate heavy machinery, care for a small child with out another adult present, or perform any activities that may cause harm to you or someone else if you were to fall asleep or be impaired.   You are being prescribed a medication which may make you sleepy. Please follow up of listed precautions for at least 24 hours after taking one dose.  While the flexeril is written for at night you may take it every 12 hours as needed.  Please call your doctor Monday for an appointment.

## 2017-06-13 NOTE — ED Provider Notes (Signed)
[facility] EMERGENCY DEPARTMENT Provider Note   CSN: [id number] Arrival date & time: 06/13/17  7867     History   Chief Complaint Chief Complaint  Patient presents with  . Neck Pain    HPI [name] is a 51 y.o. female with a history of chronic pain on Dilaudid and fentanyl patches, headaches, bipolar, schizophrenia, who presents today for evaluation of atraumatic left-sided neck pain.  She reports that 3 days ago she woke up after she "slept funny" and has had pain on her left sided neck and upper shoulder since.  She reports that she has been taking 800 mg ibuprofen 2-3 times a day, been attempting heat with minimal relief.  She reports that recently it has been hurting too much for her to put her heating pack on her.  No fevers or chills.  Denies headache.  No visual changes or dizziness.  She denies any arm weakness, numbness, tingling, or abnormal sensations.  HPI  Past Medical History:  Diagnosis Date  . Abdominal pain   . Accidental drug overdose April 2013  . Anxiety   . Atrial fibrillation ([place]) 09/29/11   converted spontaneously  . Chronic back pain   . Chronic knee pain   . Chronic nausea   . Chronic pain   . COPD (chronic obstructive pulmonary disease) ([place])   . Depression   . Diabetes mellitus    states her doctor took her off all DM meds in past month  . Diabetic neuropathy ([place])   . Dyspnea    with exertion   . GERD (gastroesophageal reflux disease)   . Headache(784.0)    migraines   . HTN (hypertension)    not on meds since in a year   . Hyperlipidemia   . Hypothyroidism    not on meds in a while   . Mental disorder    Bipolar and schizophrenic  . Requires supplemental oxygen    as needed per patient   . Schizophrenia ([place])   . Tobacco abuse     Patient Active Problem List   Diagnosis Date Noted  . Abdominal pain 04/18/2016  . Dehydration 04/17/2016  . Morbid obesity ([place]) 03/25/2016  . Type 2 diabetes mellitus with  diabetic polyneuropathy, with long-term current use of insulin ([place]) 09/06/2015  . Vaginitis and vulvovaginitis, unspecified 04/19/2013  . BV (bacterial vaginosis) 04/19/2013  . HTN (hypertension) 11/04/2011  . Hypokalemia 10/02/2011  . atrial fibrillation 09/30/2011  . Acute respiratory failure ([place]) 09/29/2011  . COPD (chronic obstructive pulmonary disease) ([place]) 09/29/2011  . Accidental drug overdose 09/29/2011    Past Surgical History:  Procedure Laterality Date  . ABDOMINAL HYSTERECTOMY    . BLADDER SUSPENSION  03/04/2011   Procedure: [facility] PROCEDURE;  Surgeon: Elayne Snare MacDiarmid;  Location: [facility];  Service: Urology;  Laterality: N/A;  . CYSTOCELE REPAIR  03/04/2011   Procedure: ANTERIOR REPAIR (CYSTOCELE);  Surgeon: Reece Packer;  Location: [facility];  Service: Urology;  Laterality: N/A;  . CYSTOSCOPY  03/04/2011   Procedure: CYSTOSCOPY;  Surgeon: Elayne Snare MacDiarmid;  Location: [facility];  Service: Urology;  Laterality: N/A;  . ESOPHAGOGASTRODUODENOSCOPY (EGD) WITH PROPOFOL N/A 05/12/2017   Procedure: ESOPHAGOGASTRODUODENOSCOPY (EGD) WITH PROPOFOL;  Surgeon: Alphonsa Overall, MD;  Location: [facility];  Service: General;  Laterality: N/A;  . GASTRIC ROUX-EN-Y N/A 03/25/2016   Procedure: LAPAROSCOPIC ROUX-EN-Y GASTRIC BYPASS WITH UPPER ENDOSCOPY;  Surgeon: Excell Seltzer, MD;  Location: [facility];  Service: General;  Laterality: N/A;  .  KNEE SURGERY    . LAPAROSCOPIC ASSISTED VAGINAL HYSTERECTOMY  03/04/2011   Procedure: LAPAROSCOPIC ASSISTED VAGINAL HYSTERECTOMY;  Surgeon: Cyril Mourning, MD;  Location: [facility];  Service: Gynecology;  Laterality: N/A;    OB History    Gravida Para Term Preterm AB Living   3 3     0 3   SAB TAB Ectopic Multiple Live Births   0 0 0 0         Home Medications    Prior to Admission medications   Medication Sig Start Date End Date Taking? Authorizing Provider  acetaminophen (TYLENOL) 160 MG/5ML liquid Take 10 mLs (320 mg total) by mouth every  4 (four) hours as needed for fever. Patient not taking: Reported on 04/09/2016 03/27/16   Kinsinger, Arta Bruce, MD  albuterol [facility] HFA) [phone] Base) MCG/ACT inhaler Inhale 2 puffs into the lungs every 6 (six) hours as needed for wheezing or shortness of breath.    [provider]  calcium carbonate (OS-CAL) 1250 (500 Ca) MG chewable tablet Chew 1 tablet 2 (two) times daily by mouth.    [provider]  carisoprodol (SOMA) 350 MG tablet Take 350 mg 3 (three) times daily as needed by mouth for muscle spasms.  04/30/17   [provider]  cyclobenzaprine (FLEXERIL) 10 MG tablet Take 1 tablet (10 mg total) by mouth at bedtime as needed for muscle spasms. 06/13/17   Lorin Glass, PA-C  cycloSPORINE (RESTASIS) 0.05 % ophthalmic emulsion Place 1 drop into both eyes 2 (two) times daily.    [provider]  Dextrose-Fructose-Sod Citrate Almon Hercules) [phone] MG CHEW Chew 3 tablets as needed by mouth (for nausea and upset stomach).    [provider]  diclofenac sodium (VOLTAREN) 1 % GEL Apply 4 g 4 (four) times daily as needed topically (for pain).    [provider]  fentaNYL (DURAGESIC - DOSED MCG/HR) 100 MCG/HR Place 100 mcg every 3 (three) days onto the skin.    [provider]  HYDROmorphone (DILAUDID) 8 MG tablet Take 8 mg every 6 (six) hours as needed by mouth for severe pain.     [provider]  hydrOXYzine (ATARAX/VISTARIL) 50 MG tablet Take 50 mg by mouth 3 (three) times daily as needed for itching.  01/14/16   [provider]  ibuprofen (ADVIL,MOTRIN) 800 MG tablet Take 800 mg every 8 (eight) hours as needed by mouth for headache or moderate pain.     [provider]  Multiple Vitamins-Iron (MULTIVITAMIN/IRON PO) Take 1 tablet 2 (two) times daily by mouth.    [provider]  NARCAN 4 MG/0.1ML LIQD nasal spray kit  05/30/17   [provider]  ondansetron (ZOFRAN ODT) 4 MG  disintegrating tablet Take 1 tablet (4 mg total) by mouth every 8 (eight) hours as needed for nausea or vomiting. 06/08/17   Nat Christen, MD  pantoprazole (PROTONIX) 40 MG tablet Take 1 tablet (40 mg total) by mouth 2 (two) times daily. Patient not taking: Reported on 06/08/2017 05/12/17   Alphonsa Overall, MD  Polyethyl Glycol-Propyl Glycol (SYSTANE) 0.4-0.3 % GEL ophthalmic gel Place 1 application as needed into both eyes (for dry eyes).    [provider]  pregabalin (LYRICA) 100 MG capsule Take 100 mg 3 (three) times daily by mouth.     [provider]  promethazine (PHENERGAN) 25 MG tablet Take 1 tablet (25 mg total) by mouth every 6 (six) hours as needed for nausea or vomiting.  04/14/16   Long, Wonda Olds, MD  SPIRIVA HANDIHALER 18 MCG inhalation capsule Place 1 puff daily as needed into inhaler and inhale (for shortness of breath or wheezing).  04/08/16   [provider]  SUMAtriptan 6 MG/0.5ML SOAJ Inject 6 mg daily as needed into the muscle (for migraines).  06/09/15   [provider]  tiZANidine (ZANAFLEX) 4 MG tablet Take 4 mg 2 (two) times daily as needed by mouth for muscle spasms.    [provider]    Family History Family History  Problem Relation Age of Onset  . Heart attack Father        23s  . Diabetes Mother   . Heart disease Mother   . Hypertension Mother   . Heart attack Sister        47  . COPD Other   . Breast cancer Neg Hx     Social History Social History   Tobacco Use  . Smoking status: Current Every Day Smoker    Packs/day: 0.50    Types: Cigarettes  . Smokeless tobacco: Never Used  Substance Use Topics  . Alcohol use: No  . Drug use: No     Allergies   Morphine and related and Aspirin   Review of Systems Review of Systems  Constitutional: Negative for chills and fever.  Eyes: Negative for photophobia.  Musculoskeletal: Positive for neck pain and neck stiffness.  Neurological: Negative for dizziness,  speech difficulty, weakness, light-headedness and headaches.     Physical Exam Updated Vital Signs BP 132/82 (BP Location: Right Arm)   Pulse 84   Temp 98.1 F (36.7 C) (Oral)   Resp 18   Ht 5' 2"  (1.575 m)   Wt 64.4 kg (142 lb)   LMP 01/08/2011   SpO2 98%   BMI 25.97 kg/m   Physical Exam  Constitutional: She appears well-developed and well-nourished. No distress.  HENT:  Head: Normocephalic and atraumatic.  Musculoskeletal:  5/5 grip strength bilaterally.   There is tenderness to palpation over left posterior superior shoulder, and left lateral neck.  There is no midline neck tenderness.  Right-sided neck is mildly tender over lateral aspect.  Left trapezius muscle is painful to palpation, is tight, consistent with muscle spasm.  Neurological: She is alert.  Sensation intact and symmetrical to bilateral upper extremities.  Gait is normal without evidence of ataxia.  Skin: Skin is warm and dry. She is not diaphoretic.  Psychiatric: She has a normal mood and affect. Her behavior is normal.  Nursing note and vitals reviewed.    ED Treatments / Results  Labs (all labs ordered are listed, but only abnormal results are displayed) Labs Reviewed - No data to display  EKG  EKG Interpretation None       Radiology No results found.  Procedures Procedures (including critical care time)  Medications Ordered in ED Medications  cyclobenzaprine (FLEXERIL) tablet 10 mg (not administered)     Initial Impression / Assessment and Plan / ED Course  I have reviewed the triage vital signs and the nursing notes.  Pertinent labs & imaging results that were available during my care of the patient were reviewed by me and considered in my medical decision making (see chart for details).    [name] presents today for evaluation of left-sided lateral neck and shoulder pain that began 3 days ago consistent with muscle spasm.  She does not have any recent history of trauma,  denies headaches, dizziness, or neurologic symptoms.  Neurologically intact with symmetrical strength in bilateral upper extremities.  No midline tenderness.  Suspect acute torticollis/trapezius muscle spasm.  She is already on fentanyl patch and Dilaudid at home, will we will treat with Flexeril, supportive measures.  PCP follow-up advised.  Return precautions discussed.  Patient was seen as a shared visit with Dr.Haviland who evaluated the patient and agreed with my plan.  Final Clinical Impressions(s) / ED Diagnoses   Final diagnoses:  Neck pain  Muscle spasms of neck    ED Discharge Orders        Ordered    cyclobenzaprine (FLEXERIL) 10 MG tablet  At bedtime PRN     06/13/17 0951       Lorin Glass, PA-C 06/13/17 3818    Isla Pence, MD 06/13/17 1025

## 2017-06-13 NOTE — ED Triage Notes (Signed)
Pt. Stated, I started having neck pain 3 days ago after I woke up. Ive tried heat and ice.

## 2017-06-14 ENCOUNTER — Encounter (HOSPITAL_COMMUNITY): Payer: Self-pay | Admitting: Emergency Medicine

## 2017-06-14 ENCOUNTER — Emergency Department (HOSPITAL_COMMUNITY): Payer: Medicare Other

## 2017-06-14 ENCOUNTER — Emergency Department (HOSPITAL_COMMUNITY)
Admission: EM | Admit: 2017-06-14 | Discharge: 2017-06-14 | Disposition: A | Discharge status: Home / Self Care | Payer: Medicare Other | Attending: Emergency Medicine | Admitting: Emergency Medicine

## 2017-06-14 DIAGNOSIS — E119 Type 2 diabetes mellitus without complications: Secondary | ICD-10-CM | POA: Insufficient documentation

## 2017-06-14 DIAGNOSIS — J449 Chronic obstructive pulmonary disease, unspecified: Secondary | ICD-10-CM | POA: Diagnosis not present

## 2017-06-14 DIAGNOSIS — Z79899 Other long term (current) drug therapy: Secondary | ICD-10-CM | POA: Insufficient documentation

## 2017-06-14 DIAGNOSIS — R451 Restlessness and agitation: Secondary | ICD-10-CM | POA: Insufficient documentation

## 2017-06-14 DIAGNOSIS — M5412 Radiculopathy, cervical region: Secondary | ICD-10-CM | POA: Insufficient documentation

## 2017-06-14 DIAGNOSIS — R51 Headache: Secondary | ICD-10-CM | POA: Diagnosis present

## 2017-06-14 DIAGNOSIS — I10 Essential (primary) hypertension: Secondary | ICD-10-CM | POA: Diagnosis not present

## 2017-06-14 DIAGNOSIS — G43809 Other migraine, not intractable, without status migrainosus: Secondary | ICD-10-CM | POA: Insufficient documentation

## 2017-06-14 DIAGNOSIS — E039 Hypothyroidism, unspecified: Secondary | ICD-10-CM | POA: Diagnosis not present

## 2017-06-14 DIAGNOSIS — F17210 Nicotine dependence, cigarettes, uncomplicated: Secondary | ICD-10-CM | POA: Diagnosis not present

## 2017-06-14 LAB — CBC WITH DIFFERENTIAL/PLATELET
Basophils Absolute: 0 K/uL (ref 0.0–0.1)
Basophils Relative: 0 %
Eosinophils Absolute: 0.2 K/uL (ref 0.0–0.7)
Eosinophils Relative: 1 %
HCT: 39.5 % (ref 36.0–46.0)
Hemoglobin: 13.2 g/dL (ref 12.0–15.0)
Lymphocytes Relative: 23 %
Lymphs Abs: 3 K/uL (ref 0.7–4.0)
MCH: 29 pg (ref 26.0–34.0)
MCHC: 33.4 g/dL (ref 30.0–36.0)
MCV: 86.8 fL (ref 78.0–100.0)
Monocytes Absolute: 0.8 K/uL (ref 0.1–1.0)
Monocytes Relative: 6 %
Neutro Abs: 8.8 K/uL — ABNORMAL HIGH (ref 1.7–7.7)
Neutrophils Relative %: 70 %
Platelets: 230 K/uL (ref 150–400)
RBC: 4.55 MIL/uL (ref 3.87–5.11)
RDW: 14 % (ref 11.5–15.5)
WBC: 12.7 K/uL — ABNORMAL HIGH (ref 4.0–10.5)

## 2017-06-14 LAB — COMPREHENSIVE METABOLIC PANEL WITH GFR
ALT: 19 U/L (ref 14–54)
AST: 22 U/L (ref 15–41)
Albumin: 4.1 g/dL (ref 3.5–5.0)
Alkaline Phosphatase: 103 U/L (ref 38–126)
Anion gap: 7 (ref 5–15)
BUN: 10 mg/dL (ref 6–20)
CO2: 26 mmol/L (ref 22–32)
Calcium: 8.9 mg/dL (ref 8.9–10.3)
Chloride: 104 mmol/L (ref 101–111)
Creatinine, Ser: 0.59 mg/dL (ref 0.44–1.00)
GFR calc Af Amer: >60 mL/min (ref >60)
GFR calc non Af Amer: >60 mL/min (ref >60)
Glucose, Bld: 115 mg/dL — ABNORMAL HIGH (ref 65–99)
Potassium: 3.7 mmol/L (ref 3.5–5.1)
Sodium: 137 mmol/L (ref 135–145)
Total Bilirubin: 0.7 mg/dL (ref 0.3–1.2)
Total Protein: 7.5 g/dL (ref 6.5–8.1)

## 2017-06-14 LAB — ETHANOL: Alcohol, Ethyl (B): <10 mg/dL (ref <10)

## 2017-06-14 MED ORDER — PROMETHAZINE HCL 25 MG/ML IJ SOLN
25.0000 mg | Freq: Once | INTRAMUSCULAR | Status: AC
Start: 2017-06-14 — End: 2017-06-14
  Administered 2017-06-14: 25 mg via INTRAVENOUS
  Filled 2017-06-14: qty 1

## 2017-06-14 MED ORDER — CYCLOBENZAPRINE HCL 5 MG PO TABS: 5.0000 mg | ORAL_TABLET | Freq: Three times a day (TID) | ORAL | 0 refills | Status: DC | PRN

## 2017-06-14 MED ORDER — HALOPERIDOL LACTATE 5 MG/ML IJ SOLN
5.0000 mg | Freq: Once | INTRAMUSCULAR | Status: AC
  Administered 2017-06-14: 5 mg via INTRAVENOUS
  Filled 2017-06-14: qty 1

## 2017-06-14 MED ORDER — HYDROMORPHONE HCL 1 MG/ML IJ SOLN
1.0000 mg | Freq: Once | INTRAMUSCULAR | Status: AC
  Administered 2017-06-14: 1 mg via INTRAVENOUS
  Filled 2017-06-14: qty 1

## 2017-06-14 MED ORDER — SODIUM CHLORIDE 0.9 % IV BOLUS (SEPSIS)
1000.0000 mL | Freq: Once | INTRAVENOUS | Status: AC
  Administered 2017-06-14: 1000 mL via INTRAVENOUS

## 2017-06-14 NOTE — ED Notes (Signed)
Labs collected by RN Manuela Schwartz.

## 2017-06-14 NOTE — ED Notes (Signed)
Pt yelling and acting out, will not calm down, pulled IV out of AC replaced with #20 in rt FA. Husband at bedside but no help in calming pt down.

## 2017-06-14 NOTE — ED Notes (Signed)
Pt states she did not take her medicine due to having 2 cups of Cognac prior to going to flea market to work. Pt with agitated behavior. Husband wanting [place] to give her a shot to calm her down, pt thrashing in bed holding head and crying.

## 2017-06-14 NOTE — ED Notes (Signed)
Called lab for them to make sure the Ethanol will be ran.

## 2017-06-14 NOTE — ED Provider Notes (Signed)
[facility] DEPT Provider Note   CSN: [id number] Arrival date & time: 06/14/17  1428     History   Chief Complaint Chief Complaint  Patient presents with  . Headache  . Alcohol Intoxication    HPI [name] is a 51 y.o. female history of chronic pain, COPD, diabetes, schizophrenia, here presenting with headache, neck pain.  Patient was seen in the ED yesterday for neck pain and was thought to have muscle strain.  Patient was prescribed Flexeril and took only one dose and last the rest of the bottle.  Patient apparently had worsening headache since yesterday.  Patient describes the pain as posterior head and constant and radiated down her neck.  Patient also drink 2 cups of Cognac prior to going to flea market to work.  Husband at bedside and requesting medicine for pain and for headache.  Patient apparently has history of migraines but usually not as severe as this. No previous history of aneurysms   The history is provided by the patient.    Past Medical History:  Diagnosis Date  . Abdominal pain   . Accidental drug overdose April 2013  . Anxiety   . Atrial fibrillation ([place]) 09/29/11   converted spontaneously  . Chronic back pain   . Chronic knee pain   . Chronic nausea   . Chronic pain   . COPD (chronic obstructive pulmonary disease) ([place])   . Depression   . Diabetes mellitus    states her doctor took her off all DM meds in past month  . Diabetic neuropathy ([place])   . Dyspnea    with exertion   . GERD (gastroesophageal reflux disease)   . Headache(784.0)    migraines   . HTN (hypertension)    not on meds since in a year   . Hyperlipidemia   . Hypothyroidism    not on meds in a while   . Mental disorder    Bipolar and schizophrenic  . Requires supplemental oxygen    as needed per patient   . Schizophrenia ([place])   . Tobacco abuse     Patient Active Problem List   Diagnosis Date Noted  . Abdominal pain 04/18/2016  .  Dehydration 04/17/2016  . Morbid obesity ([place]) 03/25/2016  . Type 2 diabetes mellitus with diabetic polyneuropathy, with long-term current use of insulin ([place]) 09/06/2015  . Vaginitis and vulvovaginitis, unspecified 04/19/2013  . BV (bacterial vaginosis) 04/19/2013  . HTN (hypertension) 11/04/2011  . Hypokalemia 10/02/2011  . atrial fibrillation 09/30/2011  . Acute respiratory failure ([place]) 09/29/2011  . COPD (chronic obstructive pulmonary disease) ([place]) 09/29/2011  . Accidental drug overdose 09/29/2011    Past Surgical History:  Procedure Laterality Date  . ABDOMINAL HYSTERECTOMY    . BLADDER SUSPENSION  03/04/2011   Procedure: [facility] PROCEDURE;  Surgeon: Elayne Snare MacDiarmid;  Location: [facility];  Service: Urology;  Laterality: N/A;  . CYSTOCELE REPAIR  03/04/2011   Procedure: ANTERIOR REPAIR (CYSTOCELE);  Surgeon: Reece Packer;  Location: [facility];  Service: Urology;  Laterality: N/A;  . CYSTOSCOPY  03/04/2011   Procedure: CYSTOSCOPY;  Surgeon: Elayne Snare MacDiarmid;  Location: [facility];  Service: Urology;  Laterality: N/A;  . ESOPHAGOGASTRODUODENOSCOPY (EGD) WITH PROPOFOL N/A 05/12/2017   Procedure: ESOPHAGOGASTRODUODENOSCOPY (EGD) WITH PROPOFOL;  Surgeon: Alphonsa Overall, MD;  Location: [facility];  Service: General;  Laterality: N/A;  . GASTRIC ROUX-EN-Y N/A 03/25/2016   Procedure: LAPAROSCOPIC ROUX-EN-Y GASTRIC BYPASS WITH UPPER ENDOSCOPY;  Surgeon: Excell Seltzer,  MD;  Location: [facility];  Service: General;  Laterality: N/A;  . KNEE SURGERY    . LAPAROSCOPIC ASSISTED VAGINAL HYSTERECTOMY  03/04/2011   Procedure: LAPAROSCOPIC ASSISTED VAGINAL HYSTERECTOMY;  Surgeon: Cyril Mourning, MD;  Location: [facility];  Service: Gynecology;  Laterality: N/A;    OB History    Gravida Para Term Preterm AB Living   3 3     0 3   SAB TAB Ectopic Multiple Live Births   0 0 0 0         Home Medications    Prior to Admission medications   Medication Sig Start Date End Date Taking? Authorizing  Provider  acetaminophen (TYLENOL) 160 MG/5ML liquid Take 10 mLs (320 mg total) by mouth every 4 (four) hours as needed for fever. Patient not taking: Reported on 04/09/2016 03/27/16   Kinsinger, Arta Bruce, MD  albuterol [facility] HFA) [phone] Base) MCG/ACT inhaler Inhale 2 puffs into the lungs every 6 (six) hours as needed for wheezing or shortness of breath.    [provider]  calcium carbonate (OS-CAL) 1250 (500 Ca) MG chewable tablet Chew 1 tablet 2 (two) times daily by mouth.    [provider]  carisoprodol (SOMA) 350 MG tablet Take 350 mg 3 (three) times daily as needed by mouth for muscle spasms.  04/30/17   [provider]  cyclobenzaprine (FLEXERIL) 10 MG tablet Take 1 tablet (10 mg total) by mouth at bedtime as needed for muscle spasms. 06/13/17   Lorin Glass, PA-C  cycloSPORINE (RESTASIS) 0.05 % ophthalmic emulsion Place 1 drop into both eyes 2 (two) times daily.    [provider]  Dextrose-Fructose-Sod Citrate Almon Hercules) [phone] MG CHEW Chew 3 tablets as needed by mouth (for nausea and upset stomach).    [provider]  diclofenac sodium (VOLTAREN) 1 % GEL Apply 4 g 4 (four) times daily as needed topically (for pain).    [provider]  fentaNYL (DURAGESIC - DOSED MCG/HR) 100 MCG/HR Place 100 mcg every 3 (three) days onto the skin.    [provider]  HYDROmorphone (DILAUDID) 8 MG tablet Take 8 mg every 6 (six) hours as needed by mouth for severe pain.     [provider]  hydrOXYzine (ATARAX/VISTARIL) 50 MG tablet Take 50 mg by mouth 3 (three) times daily as needed for itching.  01/14/16   [provider]  ibuprofen (ADVIL,MOTRIN) 800 MG tablet Take 800 mg every 8 (eight) hours as needed by mouth for headache or moderate pain.     [provider]  Multiple Vitamins-Iron (MULTIVITAMIN/IRON PO) Take 1 tablet 2 (two) times daily by mouth.    [provider]  NARCAN 4 MG/0.1ML LIQD  nasal spray kit  05/30/17   [provider]  ondansetron (ZOFRAN ODT) 4 MG disintegrating tablet Take 1 tablet (4 mg total) by mouth every 8 (eight) hours as needed for nausea or vomiting. 06/08/17   Nat Christen, MD  pantoprazole (PROTONIX) 40 MG tablet Take 1 tablet (40 mg total) by mouth 2 (two) times daily. Patient not taking: Reported on 06/08/2017 05/12/17   Alphonsa Overall, MD  Polyethyl Glycol-Propyl Glycol (SYSTANE) 0.4-0.3 % GEL ophthalmic gel Place 1 application as needed into both eyes (for dry eyes).    [provider]  pregabalin (LYRICA) 100 MG capsule Take 100 mg 3 (three) times daily by mouth.     [provider]  promethazine (PHENERGAN) 25 MG tablet Take 1 tablet (25 mg  total) by mouth every 6 (six) hours as needed for nausea or vomiting. 04/14/16   Long, Wonda Olds, MD  SPIRIVA HANDIHALER 18 MCG inhalation capsule Place 1 puff daily as needed into inhaler and inhale (for shortness of breath or wheezing).  04/08/16   [provider]  SUMAtriptan 6 MG/0.5ML SOAJ Inject 6 mg daily as needed into the muscle (for migraines).  06/09/15   [provider]  tiZANidine (ZANAFLEX) 4 MG tablet Take 4 mg 2 (two) times daily as needed by mouth for muscle spasms.    [provider]    Family History Family History  Problem Relation Age of Onset  . Heart attack Father        56s  . Diabetes Mother   . Heart disease Mother   . Hypertension Mother   . Heart attack Sister        23  . COPD Other   . Breast cancer Neg Hx     Social History Social History   Tobacco Use  . Smoking status: Current Every Day Smoker    Packs/day: 0.50    Types: Cigarettes  . Smokeless tobacco: Never Used  Substance Use Topics  . Alcohol use: Yes  . Drug use: No     Allergies   Morphine and related and Aspirin   Review of Systems Review of Systems  Neurological: Positive for headaches.  All other systems reviewed and are  negative.    Physical Exam Updated Vital Signs BP (!) 148/81   Pulse 92   Temp (!) 97.5 F (36.4 C) (Oral)   Resp 14   LMP 01/08/2011   SpO2 98%   Physical Exam  Constitutional: She is oriented to person, place, and time.  Uncomfortable, agitated   HENT:  Head: Normocephalic.  Mouth/Throat: Oropharynx is clear and moist.  Eyes: EOM are normal. Pupils are equal, round, and reactive to light.  Neck: Normal range of motion. Neck supple.  No meningeal signs, mild paracervical tenderness, able to range neck with no problems   Cardiovascular: Normal rate and normal heart sounds.  Pulmonary/Chest: Effort normal and breath sounds normal.  Abdominal: Soft. Bowel sounds are normal.  Musculoskeletal: Normal range of motion.  Neurological: She is alert and oriented to person, place, and time. She has normal strength.  CN 2-12 intact, moving all extremities   Skin: Skin is warm.  Nursing note and vitals reviewed.    ED Treatments / Results  Labs (all labs ordered are listed, but only abnormal results are displayed) Labs Reviewed  CBC WITH DIFFERENTIAL/PLATELET - Abnormal; Notable for the following components:      Result Value   WBC 12.7 (*)    Neutro Abs 8.8 (*)    All other components within normal limits  COMPREHENSIVE METABOLIC PANEL - Abnormal; Notable for the following components:   Glucose, Bld 115 (*)    All other components within normal limits  ETHANOL    EKG  EKG Interpretation None       Radiology Ct Head Wo Contrast  Result Date: 06/14/2017 CLINICAL DATA:  Headache 3 days worse today. Evaluate for subarachnoid hemorrhage. EXAM: CT HEAD WITHOUT CONTRAST CT CERVICAL SPINE WITHOUT CONTRAST TECHNIQUE: Multidetector CT imaging of the head and cervical spine was performed following the standard protocol without intravenous contrast. Multiplanar CT image reconstructions of the cervical spine were also generated. COMPARISON:  07/27/2013 and 02/25/2016 FINDINGS: CT  HEAD FINDINGS Brain: No evidence of acute infarction, hemorrhage, hydrocephalus, extra-axial collection or  mass lesion/mass effect. Vascular: No hyperdense vessel or unexpected calcification. Skull: Normal. Negative for fracture or focal lesion. Sinuses/Orbits: No acute finding. Other: None. CT CERVICAL SPINE FINDINGS Alignment: Normal. Skull base and vertebrae: No acute fracture. No primary bone lesion or focal pathologic process. Vertebral body heights are normal. There is mild spondylosis of the cervical spine. Atlantoaxial articulation is normal. No acute fracture or subluxation. Soft tissues and spinal canal: Prevertebral soft tissues are normal. 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 Disc levels: Mild disc space narrowing at the C5-6 level. Prominent central disc bulge with possible protrusion at the C3-4 level. Upper chest: Negative. Other: None. IMPRESSION: No acute brain injury. No acute cervical spine injury. Mild spondylosis of the cervical spine with disc disease at the C5-6 level. 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 Prominent central disc bulge with possible protrusion at the C3-4 level. Electronically Signed   By: Marin Olp M.D.   On: 06/14/2017 16:33   Ct Cervical Spine Wo Contrast  Result Date: 06/14/2017 CLINICAL DATA:  Headache 3 days worse today. Evaluate for subarachnoid hemorrhage. EXAM: CT HEAD WITHOUT CONTRAST CT CERVICAL SPINE WITHOUT CONTRAST TECHNIQUE: Multidetector CT imaging of the head and cervical spine was performed following the standard protocol without intravenous contrast. Multiplanar CT image reconstructions of the cervical spine were also generated. COMPARISON:  07/27/2013 and 02/25/2016 FINDINGS: CT HEAD FINDINGS Brain: No evidence of acute infarction, hemorrhage, hydrocephalus, extra-axial  collection or mass lesion/mass effect. Vascular: No hyperdense vessel or unexpected calcification. Skull: Normal. Negative for fracture or focal lesion. Sinuses/Orbits: No acute finding. Other: None. CT CERVICAL SPINE FINDINGS Alignment: Normal. Skull base and vertebrae: No acute fracture. No primary bone lesion or focal pathologic process. Vertebral body heights are normal. There is mild spondylosis of the cervical spine. Atlantoaxial articulation is normal. No acute fracture or subluxation. Soft tissues and spinal canal: Prevertebral soft tissues are normal. 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 Disc levels: Mild disc space narrowing at the C5-6 level. Prominent central disc bulge with possible protrusion at the C3-4 level. Upper chest: Negative. Other: None. IMPRESSION: No acute brain injury. No acute cervical spine injury. Mild spondylosis of the cervical spine with disc disease at the C5-6 level. 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 Prominent central disc bulge with possible protrusion at the C3-4 level. Electronically Signed   By: Marin Olp M.D.   On: 06/14/2017 16:33   Dg Chest Portable 1 View  Result Date: 06/14/2017 CLINICAL DATA:  Pt in altered state, does not answer questions - chart states hx afib - hx COPD - diabetic - hx hypertension - headache - current everyday smoker EXAM: PORTABLE CHEST 1 VIEW COMPARISON:  03/31/2016 FINDINGS: Cardiac silhouette is normal in size. No mediastinal or hilar masses. No evidence of adenopathy. Clear lungs.  No pleural effusion.  No pneumothorax. Skeletal structures are intact. IMPRESSION: No active disease. Electronically Signed   By: Lajean Manes M.D.   On: 06/14/2017 17:28    Procedures Procedures (including critical care time)  Medications Ordered in ED Medications  sodium  chloride 0.9 % bolus 1,000 mL (0 mLs Intravenous Stopped 06/14/17 1700)  promethazine (PHENERGAN) injection 25 mg (25 mg Intravenous Given 06/14/17 1531)  HYDROmorphone (DILAUDID) injection 1 mg (  1 mg Intravenous Given 06/14/17 1534)  haloperidol lactate (HALDOL) injection 5 mg (5 mg Intravenous Given 06/14/17 1542)     Initial Impression / Assessment and Plan / ED Course  I have reviewed the triage vital signs and the nursing notes.  Pertinent labs & imaging results that were available during my care of the patient were reviewed by me and considered in my medical decision making (see chart for details).     KINGSLEE DOWSE is a 51 y.o. female here with alcohol intoxication, headaches, neck pain. Nonfocal neuro exam yesterday and today. Likely migraines. Given recurrent symptoms, will get CT head, labs. Will give migraine cocktail and haldol for agitation.   5:59 PM Patient comfortably sleeping. CT showed cervical spine disease with disc protrusion C3-4 level. Consider cervical radiculopathy causing symptoms. She has dilaudid and fentanyl patch at home. She was prescribed flexeril and apparently loss the bottle so will give 5 more pills. She is not a good candidate for steroids given her psych history. She can follow up with spine doctor outpatient.    Final Clinical Impressions(s) / ED Diagnoses   Final diagnoses:  None    ED Discharge Orders    None       Drenda Freeze, MD 06/14/17 1801

## 2017-06-14 NOTE — ED Notes (Signed)
Pt continues to yell out at times, remains on monitor with door open for staff observation.

## 2017-06-14 NOTE — ED Triage Notes (Signed)
Patient c/o headache that has been ongoing for 3 days but got worse over the past hour. Patient very tearful, loud and restless.  Patient screaming, "my head is about to blow up!"

## 2017-06-14 NOTE — Discharge Instructions (Signed)
Continue your current meds.   Take flexeril for muscle spasms.   Stop drinking alcohol   You have a pinch nerve in your neck. See spine doctor for follow up   Return to ER if you have worse headache, neck pain, weakness, numbness.

## 2017-06-27 ENCOUNTER — Emergency Department (HOSPITAL_COMMUNITY): Payer: Medicare Other

## 2017-06-27 ENCOUNTER — Emergency Department (HOSPITAL_COMMUNITY)
Admission: EM | Admit: 2017-06-27 | Discharge: 2017-06-27 | Disposition: A | Discharge status: Home / Self Care | Payer: Medicare Other | Attending: Emergency Medicine | Admitting: Emergency Medicine

## 2017-06-27 ENCOUNTER — Other Ambulatory Visit: Payer: Self-pay

## 2017-06-27 ENCOUNTER — Encounter (HOSPITAL_COMMUNITY): Payer: Self-pay

## 2017-06-27 DIAGNOSIS — Z79899 Other long term (current) drug therapy: Secondary | ICD-10-CM | POA: Insufficient documentation

## 2017-06-27 DIAGNOSIS — J449 Chronic obstructive pulmonary disease, unspecified: Secondary | ICD-10-CM | POA: Diagnosis not present

## 2017-06-27 DIAGNOSIS — F17210 Nicotine dependence, cigarettes, uncomplicated: Secondary | ICD-10-CM | POA: Insufficient documentation

## 2017-06-27 DIAGNOSIS — K529 Noninfective gastroenteritis and colitis, unspecified: Secondary | ICD-10-CM | POA: Insufficient documentation

## 2017-06-27 DIAGNOSIS — R112 Nausea with vomiting, unspecified: Secondary | ICD-10-CM | POA: Diagnosis present

## 2017-06-27 LAB — LIPASE, BLOOD: Lipase: 42 U/L (ref 11–51)

## 2017-06-27 LAB — COMPREHENSIVE METABOLIC PANEL
ALT: 22 U/L (ref 14–54)
AST: 19 U/L (ref 15–41)
Albumin: 3.9 g/dL (ref 3.5–5.0)
Alkaline Phosphatase: 112 U/L (ref 38–126)
Anion gap: 9 (ref 5–15)
BILIRUBIN TOTAL: 0.8 mg/dL (ref 0.3–1.2)
BUN: 8 mg/dL (ref 6–20)
CO2: 29 mmol/L (ref 22–32)
CREATININE: 0.61 mg/dL (ref 0.44–1.00)
Calcium: 9.4 mg/dL (ref 8.9–10.3)
Chloride: 100 mmol/L — ABNORMAL LOW (ref 101–111)
GFR calc Af Amer: >60 mL/min (ref >60)
Glucose, Bld: 216 mg/dL — ABNORMAL HIGH (ref 65–99)
Potassium: 4.4 mmol/L (ref 3.5–5.1)
Sodium: 138 mmol/L (ref 135–145)
TOTAL PROTEIN: 7.8 g/dL (ref 6.5–8.1)

## 2017-06-27 LAB — CBC
HCT: 46.5 % — ABNORMAL HIGH (ref 36.0–46.0)
Hemoglobin: 16 g/dL — ABNORMAL HIGH (ref 12.0–15.0)
MCH: 29.4 pg (ref 26.0–34.0)
MCHC: 34.4 g/dL (ref 30.0–36.0)
MCV: 85.3 fL (ref 78.0–100.0)
PLATELETS: 224 10*3/uL (ref 150–400)
RBC: 5.45 MIL/uL — ABNORMAL HIGH (ref 3.87–5.11)
RDW: 13.7 % (ref 11.5–15.5)
WBC: 8.9 10*3/uL (ref 4.0–10.5)

## 2017-06-27 LAB — I-STAT BETA HCG BLOOD, ED (MC, WL, AP ONLY): I-stat hCG, quantitative: <5 m[IU]/mL (ref <5)

## 2017-06-27 MED ORDER — DIPHENHYDRAMINE HCL 50 MG/ML IJ SOLN
12.5000 mg | Freq: Once | INTRAMUSCULAR | Status: AC
  Administered 2017-06-27: 12.5 mg via INTRAVENOUS
  Filled 2017-06-27: qty 1

## 2017-06-27 MED ORDER — DIPHENOXYLATE-ATROPINE 2.5-0.025 MG PO TABS: 1.0000 | ORAL_TABLET | Freq: Four times a day (QID) | ORAL | 0 refills | Status: DC | PRN

## 2017-06-27 MED ORDER — SODIUM CHLORIDE 0.9 % IV BOLUS (SEPSIS)
1000.0000 mL | Freq: Once | INTRAVENOUS | Status: AC
  Administered 2017-06-27: 1000 mL via INTRAVENOUS

## 2017-06-27 MED ORDER — DICYCLOMINE HCL 20 MG PO TABS: 20.0000 mg | ORAL_TABLET | Freq: Two times a day (BID) | ORAL | 0 refills | Status: DC

## 2017-06-27 MED ORDER — SODIUM CHLORIDE 0.9 % IV SOLN
Freq: Once | INTRAVENOUS | Status: AC
  Administered 2017-06-27: 17:00:00 via INTRAVENOUS

## 2017-06-27 MED ORDER — PROMETHAZINE HCL 25 MG/ML IJ SOLN
12.5000 mg | Freq: Once | INTRAMUSCULAR | Status: AC
  Administered 2017-06-27: 12.5 mg via INTRAVENOUS
  Filled 2017-06-27: qty 1

## 2017-06-27 MED ORDER — HYDROMORPHONE HCL 1 MG/ML IJ SOLN
0.5000 mg | INTRAMUSCULAR | Status: AC | PRN
  Administered 2017-06-27 (×2): 0.5 mg via INTRAVENOUS
  Filled 2017-06-27 (×2): qty 1

## 2017-06-27 MED ORDER — ONDANSETRON 4 MG PO TBDP: 4.0000 mg | ORAL_TABLET | Freq: Three times a day (TID) | ORAL | 0 refills | Status: DC | PRN

## 2017-06-27 MED ORDER — IOPAMIDOL (ISOVUE-300) INJECTION 61%
100.0000 mL | Freq: Once | INTRAVENOUS | Status: AC | PRN
  Administered 2017-06-27: 100 mL via INTRAVENOUS

## 2017-06-27 MED ORDER — ONDANSETRON 4 MG PO TBDP
4.0000 mg | ORAL_TABLET | Freq: Once | ORAL | Status: AC | PRN
  Administered 2017-06-27: 4 mg via ORAL
  Filled 2017-06-27: qty 1

## 2017-06-27 MED ORDER — ALBUTEROL SULFATE (2.5 MG/3ML) 0.083% IN NEBU
5.0000 mg | INHALATION_SOLUTION | Freq: Once | RESPIRATORY_TRACT | Status: AC
  Administered 2017-06-27: 5 mg via RESPIRATORY_TRACT
  Filled 2017-06-27: qty 6

## 2017-06-27 NOTE — ED Provider Notes (Signed)
[facility] DEPT Provider Note   CSN: [id number] Arrival date & time: 06/27/17  1225     History   Chief Complaint Chief Complaint  Patient presents with  . Abdominal Pain  . Emesis  . Diarrhea    HPI [name] is a 52 y.o. female. Chief complaint is abdominal pain, nausea, vomiting, diarrhea  HPI 52 year old female. History of gastric bypass surgery. Has had 4 days of nausea vomiting and diarrhea. Some abdominal cramping. No localized pain. No hematemesis. No bloody stools. No fever.  No dysuria frequency hematuria. Is not immune suppressed. Is non-insulin dependent diabetic.  Past Medical History:  Diagnosis Date  . Abdominal pain   . Accidental drug overdose April 2013  . Anxiety   . Atrial fibrillation ([place]) 09/29/11   converted spontaneously  . Chronic back pain   . Chronic knee pain   . Chronic nausea   . Chronic pain   . COPD (chronic obstructive pulmonary disease) ([place])   . Depression   . Diabetes mellitus    states her doctor took her off all DM meds in past month  . Diabetic neuropathy ([place])   . Dyspnea    with exertion   . GERD (gastroesophageal reflux disease)   . Headache(784.0)    migraines   . HTN (hypertension)    not on meds since in a year   . Hyperlipidemia   . Hypothyroidism    not on meds in a while   . Mental disorder    Bipolar and schizophrenic  . Requires supplemental oxygen    as needed per patient   . Schizophrenia ([place])   . Tobacco abuse     Patient Active Problem List   Diagnosis Date Noted  . Abdominal pain 04/18/2016  . Dehydration 04/17/2016  . Morbid obesity ([place]) 03/25/2016  . Type 2 diabetes mellitus with diabetic polyneuropathy, with long-term current use of insulin ([place]) 09/06/2015  . Vaginitis and vulvovaginitis, unspecified 04/19/2013  . BV (bacterial vaginosis) 04/19/2013  . HTN (hypertension) 11/04/2011  . Hypokalemia 10/02/2011  . atrial fibrillation 09/30/2011  . Acute  respiratory failure ([place]) 09/29/2011  . COPD (chronic obstructive pulmonary disease) ([place]) 09/29/2011  . Accidental drug overdose 09/29/2011    Past Surgical History:  Procedure Laterality Date  . ABDOMINAL HYSTERECTOMY    . BLADDER SUSPENSION  03/04/2011   Procedure: [facility] PROCEDURE;  Surgeon: Elayne Snare MacDiarmid;  Location: [facility];  Service: Urology;  Laterality: N/A;  . CYSTOCELE REPAIR  03/04/2011   Procedure: ANTERIOR REPAIR (CYSTOCELE);  Surgeon: Reece Packer;  Location: [facility];  Service: Urology;  Laterality: N/A;  . CYSTOSCOPY  03/04/2011   Procedure: CYSTOSCOPY;  Surgeon: Elayne Snare MacDiarmid;  Location: [facility];  Service: Urology;  Laterality: N/A;  . ESOPHAGOGASTRODUODENOSCOPY (EGD) WITH PROPOFOL N/A 05/12/2017   Procedure: ESOPHAGOGASTRODUODENOSCOPY (EGD) WITH PROPOFOL;  Surgeon: Alphonsa Overall, MD;  Location: [facility];  Service: General;  Laterality: N/A;  . GASTRIC ROUX-EN-Y N/A 03/25/2016   Procedure: LAPAROSCOPIC ROUX-EN-Y GASTRIC BYPASS WITH UPPER ENDOSCOPY;  Surgeon: Excell Seltzer, MD;  Location: [facility];  Service: General;  Laterality: N/A;  . KNEE SURGERY    . LAPAROSCOPIC ASSISTED VAGINAL HYSTERECTOMY  03/04/2011   Procedure: LAPAROSCOPIC ASSISTED VAGINAL HYSTERECTOMY;  Surgeon: Cyril Mourning, MD;  Location: [facility];  Service: Gynecology;  Laterality: N/A;    OB History    Gravida Para Term Preterm AB Living   3 3     0 3  SAB TAB Ectopic Multiple Live Births   0 0 0 0         Home Medications    Prior to Admission medications   Medication Sig Start Date End Date Taking? Authorizing Provider  acetaminophen (TYLENOL) 160 MG/5ML liquid Take 10 mLs (320 mg total) by mouth every 4 (four) hours as needed for fever. Patient not taking: Reported on 04/09/2016 03/27/16   Kinsinger, Arta Bruce, MD  albuterol [facility] HFA) [phone] Base) MCG/ACT inhaler Inhale 2 puffs into the lungs every 6 (six) hours as needed for wheezing or shortness of breath.    [provider]  calcium carbonate (OS-CAL) 1250 (500 Ca) MG chewable tablet Chew 1 tablet 2 (two) times daily by mouth.    [provider]  carisoprodol (SOMA) 350 MG tablet Take 350 mg 3 (three) times daily as needed by mouth for muscle spasms.  04/30/17   [provider]  cyclobenzaprine (FLEXERIL) 5 MG tablet Take 1 tablet (5 mg total) by mouth 3 (three) times daily as needed for muscle spasms. 06/14/17   Drenda Freeze, MD  cycloSPORINE (RESTASIS) 0.05 % ophthalmic emulsion Place 1 drop into both eyes 2 (two) times daily.    [provider]  Dextrose-Fructose-Sod Citrate Almon Hercules) [phone] MG CHEW Chew 3 tablets as needed by mouth (for nausea and upset stomach).    [provider]  diclofenac sodium (VOLTAREN) 1 % GEL Apply 4 g 4 (four) times daily as needed topically (for pain).    [provider]  dicyclomine (BENTYL) 20 MG tablet Take 1 tablet (20 mg total) by mouth 2 (two) times daily. 06/27/17   Tanna Furry, MD  diphenoxylate-atropine (LOMOTIL) 2.5-0.025 MG tablet Take 1 tablet by mouth 4 (four) times daily as needed for diarrhea or loose stools. 06/27/17   Tanna Furry, MD  fentaNYL (DURAGESIC - DOSED MCG/HR) 100 MCG/HR Place 100 mcg every 3 (three) days onto the skin.    [provider]  HYDROmorphone (DILAUDID) 8 MG tablet Take 8 mg every 6 (six) hours as needed by mouth for severe pain.     [provider]  hydrOXYzine (ATARAX/VISTARIL) 50 MG tablet Take 50 mg by mouth 3 (three) times daily as needed for itching.  01/14/16   [provider]  ibuprofen (ADVIL,MOTRIN) 800 MG tablet Take 800 mg every 8 (eight) hours as needed by mouth for headache or moderate pain.     [provider]  Multiple Vitamins-Iron (MULTIVITAMIN/IRON PO) Take 1 tablet 2 (two) times daily by mouth.    [provider]  NARCAN 4 MG/0.1ML LIQD nasal spray kit  05/30/17   [provider]  ondansetron (ZOFRAN ODT) 4 MG  disintegrating tablet Take 1 tablet (4 mg total) by mouth every 8 (eight) hours as needed for nausea. 06/27/17   Tanna Furry, MD  pantoprazole (PROTONIX) 40 MG tablet Take 1 tablet (40 mg total) by mouth 2 (two) times daily. Patient not taking: Reported on 06/08/2017 05/12/17   Alphonsa Overall, MD  Polyethyl Glycol-Propyl Glycol (SYSTANE) 0.4-0.3 % GEL ophthalmic gel Place 1 application as needed into both eyes (for dry eyes).    [provider]  pregabalin (LYRICA) 100 MG capsule Take 100 mg 3 (three) times daily by mouth.     [provider]  promethazine (PHENERGAN) 25 MG tablet Take 1 tablet (25 mg total) by mouth every 6 (six) hours as needed for nausea or vomiting. 04/14/16   Long, Wonda Olds, MD  [facility]  HANDIHALER 18 MCG inhalation capsule Place 1 puff daily as needed into inhaler and inhale (for shortness of breath or wheezing).  04/08/16   [provider]  SUMAtriptan 6 MG/0.5ML SOAJ Inject 6 mg daily as needed into the muscle (for migraines).  06/09/15   [provider]  tiZANidine (ZANAFLEX) 4 MG tablet Take 4 mg 2 (two) times daily as needed by mouth for muscle spasms.    [provider]    Family History Family History  Problem Relation Age of Onset  . Heart attack Father        61s  . Diabetes Mother   . Heart disease Mother   . Hypertension Mother   . Heart attack Sister        73  . COPD Other   . Breast cancer Neg Hx     Social History Social History   Tobacco Use  . Smoking status: Current Every Day Smoker    Packs/day: 0.50    Types: Cigarettes  . Smokeless tobacco: Never Used  Substance Use Topics  . Alcohol use: Yes  . Drug use: No     Allergies   Morphine and related and Aspirin   Review of Systems Review of Systems  Constitutional: Negative for appetite change, chills, diaphoresis, fatigue and fever.  HENT: Negative for mouth sores, sore throat and trouble swallowing.   Eyes: Negative for visual disturbance.   Respiratory: Negative for cough, chest tightness, shortness of breath and wheezing.   Cardiovascular: Negative for chest pain.  Gastrointestinal: Positive for abdominal pain, diarrhea, nausea and vomiting. Negative for abdominal distention.  Endocrine: Negative for polydipsia, polyphagia and polyuria.  Genitourinary: Negative for dysuria, frequency and hematuria.  Musculoskeletal: Negative for gait problem.  Skin: Negative for color change, pallor and rash.  Neurological: Negative for dizziness, syncope, light-headedness and headaches.  Hematological: Does not bruise/bleed easily.  Psychiatric/Behavioral: Negative for behavioral problems and confusion.     Physical Exam Updated Vital Signs BP (!) 152/85   Pulse 92   Temp 98.1 F (36.7 C) (Oral)   Resp 18   Ht 5' 2"  (1.575 m)   Wt 64.4 kg (142 lb)   LMP 01/08/2011   SpO2 98%   BMI 25.97 kg/m   Physical Exam  Constitutional: She is oriented to person, place, and time. She appears well-developed and well-nourished. No distress.  HENT:  Head: Normocephalic.  Eyes: Conjunctivae are normal. Pupils are equal, round, and reactive to light. No scleral icterus.  Neck: Normal range of motion. Neck supple. No thyromegaly present.  Cardiovascular: Normal rate and regular rhythm. Exam reveals no gallop and no friction rub.  No murmur heard. Pulmonary/Chest: Effort normal and breath sounds normal. No respiratory distress. She has no wheezes. She has no rales.  Abdominal: Soft. Bowel sounds are normal. She exhibits no distension. There is no tenderness. There is no rebound.  Soft benign abdomen. Is uncomfortable. Moving and changing positions in bed. Non-peritoneal abdomen.  Musculoskeletal: Normal range of motion.  Neurological: She is alert and oriented to person, place, and time.  Skin: Skin is warm and dry. No rash noted.  Psychiatric: She has a normal mood and affect. Her behavior is normal.     ED Treatments / Results   Labs (all labs ordered are listed, but only abnormal results are displayed) Labs Reviewed  COMPREHENSIVE METABOLIC PANEL - Abnormal; Notable for the following components:      Result Value   Chloride 100 (*)    Glucose,  Bld 216 (*)    All other components within normal limits  CBC - Abnormal; Notable for the following components:   RBC 5.45 (*)    Hemoglobin 16.0 (*)    HCT 46.5 (*)    All other components within normal limits  LIPASE, BLOOD  URINALYSIS, ROUTINE W REFLEX MICROSCOPIC  I-STAT BETA HCG BLOOD, ED ([facility], [facility], [facility] ONLY)    EKG  EKG Interpretation  Date/Time:  Saturday June 27 2017 15:17:16 EST Ventricular Rate:  95 PR Interval:    QRS Duration: 86 QT Interval:  356 QTC Calculation: 448 R Axis:   50 Text Interpretation:  Sinus rhythm Right atrial enlargement RSR' in V1 or V2, probably normal variant No significant change since last tracing Confirmed by Dorie Rank [phone]) on 06/27/2017 3:23:49 PM       Radiology Dg Chest 2 View  Result Date: 06/27/2017 CLINICAL DATA:  Shortness of breath. History of COPD and hypertension. Current smoker. EXAM: CHEST  2 VIEW COMPARISON:  06/14/2017; 10/14/2015; 09/14/2015 FINDINGS: Grossly unchanged cardiac silhouette and mediastinal contours given improved inspiratory effort. Overall improved aeration of lungs with persistent slightly nodular interstitial thickening within the bilateral mid lungs, left greater than right. No focal airspace opacities. No pleural effusion pneumothorax. No evidence of edema. No acute osseus abnormalities. IMPRESSION: Improved aeration of the lungs with residual findings of chronic bronchitic change. Electronically Signed   By: Sandi Mariscal M.D.   On: 06/27/2017 14:30   Ct Abdomen Pelvis W Contrast  Result Date: 06/27/2017 CLINICAL DATA:  Nausea vomiting and diarrhea. Bilateral lower abdominal pain for several days. EXAM: CT ABDOMEN AND PELVIS WITH CONTRAST TECHNIQUE: Multidetector CT imaging of the abdomen  and pelvis was performed using the standard protocol following bolus administration of intravenous contrast. CONTRAST:  161m ISOVUE-300 IOPAMIDOL (ISOVUE-300) INJECTION 61% COMPARISON:  04/18/2016 FINDINGS: Lower chest: Circumferential bronchial wall thickening is noted in the lung bases bilaterally. Hepatobiliary: Several tiny hypodensities in the liver parenchyma are similar to prior, including the 8 mm lesion in the posterior right hepatic lobe (image 32 series 2). Liver otherwise unremarkable. There is no evidence for gallstones, gallbladder wall thickening, or pericholecystic fluid. No intrahepatic or extrahepatic biliary dilation. Pancreas: No focal mass lesion. No dilatation of the main duct. No intraparenchymal cyst. No peripancreatic edema. Spleen: No splenomegaly. No focal mass lesion. Adrenals/Urinary Tract: No adrenal nodule or mass. Kidneys are unremarkable. No evidence for hydroureter. The urinary bladder appears normal for the degree of distention. Stomach/Bowel: Surgical change again noted in the stomach. No wall thickening at the gastrojejunostomy. Roux limb is nondilated. Duodenum unremarkable. A very short segment of small bowel intussusception is identified in the left abdomen in the region of the distal anastomosis (image 38 coronal series 5). There is no associated wall thickening or perienteric edema. No dilatation of small bowel proximal to this level. These imaging features are most likely transient. No small bowel wall thickening. No small bowel dilatation. The terminal ileum is normal. The appendix is normal. No gross colonic mass. No colonic wall thickening. No substantial diverticular change. Vascular/Lymphatic: There is abdominal aortic atherosclerosis without aneurysm. There is no gastrohepatic or hepatoduodenal ligament lymphadenopathy. No intraperitoneal or retroperitoneal lymphadenopathy. No pelvic sidewall lymphadenopathy. Reproductive: Uterus surgically absent.  There is no  adnexal mass. Other: No intraperitoneal free fluid. Musculoskeletal: Bone windows reveal no worrisome lytic or sclerotic osseous lesions. Small bilateral groin hernias contain only fat. IMPRESSION: 1. No acute findings in the abdomen or pelvis. Specifically, no findings to explain  the patient's history of nausea with vomiting and diarrhea and bilateral lower abdominal pain. 2. Status post gastric bypass. Very short segment of small bowel intussusception noted in the region of the distal anastomosis without complicating features. Small bowel intussusception can be a transient phenomenon in an adult. 3.  Aortic Atherosclerois (ICD10-170.0) Electronically Signed   By: Misty Stanley M.D.   On: 06/27/2017 19:08    Procedures Procedures (including critical care time)  Medications Ordered in ED Medications  ondansetron (ZOFRAN-ODT) disintegrating tablet 4 mg (4 mg Oral Given 06/27/17 1449)  albuterol (PROVENTIL) (2.5 MG/3ML) 0.083% nebulizer solution 5 mg (5 mg Nebulization Given 06/27/17 1449)  sodium chloride 0.9 % bolus 1,000 mL (0 mLs Intravenous Stopped 06/27/17 1742)  0.9 %  sodium chloride infusion ( Intravenous Stopped 06/27/17 1952)  promethazine (PHENERGAN) injection 12.5 mg (12.5 mg Intravenous Given 06/27/17 1745)  HYDROmorphone (DILAUDID) injection 0.5 mg (0.5 mg Intravenous Given 06/27/17 1955)  iopamidol (ISOVUE-300) 61 % injection 100 mL (100 mLs Intravenous Contrast Given 06/27/17 1818)  promethazine (PHENERGAN) injection 12.5 mg (12.5 mg Intravenous Given 06/27/17 1953)  diphenhydrAMINE (BENADRYL) injection 12.5 mg (12.5 mg Intravenous Given 06/27/17 1954)  sodium chloride 0.9 % bolus 1,000 mL (1,000 mLs Intravenous New Bag/Given 06/27/17 1952)     Initial Impression / Assessment and Plan / ED Course  I have reviewed the triage vital signs and the nursing notes.  Pertinent labs & imaging results that were available during my care of the patient were reviewed by me and considered in my medical decision  making (see chart for details).    IV placed. Given fluids. Given medication and antiemetics. Pain improves. Given additional Phenergan and Benadryl and her nausea improves and is taking some by mouth liquids. CT does not show an acute process. No concerning lab findings. She is appropriate for discharge home. Clear liquids. Slowly advance your diet. Recheck with worsening or new symptoms. Primary care follow-up  Final Clinical Impressions(s) / ED Diagnoses   Final diagnoses:  Gastroenteritis    ED Discharge Orders        Ordered    diphenoxylate-atropine (LOMOTIL) 2.5-0.025 MG tablet  4 times daily PRN     06/27/17 2029    ondansetron (ZOFRAN ODT) 4 MG disintegrating tablet  Every 8 hours PRN     06/27/17 2029    dicyclomine (BENTYL) 20 MG tablet  2 times daily     06/27/17 2029       Tanna Furry, MD 06/27/17 2038

## 2017-06-27 NOTE — ED Triage Notes (Addendum)
My c/o N/V/D, bilateral lower abd pain since new years. Pain 10/10. 02 sats 91% on 3L/min [place], pt reports no home 02 use, but informed NT Ericka she uses hoem O2 as needed. Hx of COPD. Pt A/Ox4.

## 2017-06-27 NOTE — ED Notes (Signed)
Pt Oz stats measuring 87-88. Per pt uses O2 at home PRN, did not bring with her. RN advised. [employer]

## 2017-06-27 NOTE — Discharge Instructions (Signed)
Clear liquids only for next 12 hours. Slowly advance diet. Zofran for nausea, Bentyl for cramps, Lomotil for diarrhea.

## 2017-07-11 ENCOUNTER — Encounter (HOSPITAL_COMMUNITY): Payer: Self-pay

## 2017-07-11 ENCOUNTER — Other Ambulatory Visit: Payer: Self-pay

## 2017-07-11 ENCOUNTER — Emergency Department (HOSPITAL_COMMUNITY)
Admission: EM | Admit: 2017-07-11 | Discharge: 2017-07-11 | Disposition: A | Discharge status: Home / Self Care | Payer: Medicare Other | Attending: Emergency Medicine | Admitting: Emergency Medicine

## 2017-07-11 DIAGNOSIS — F17210 Nicotine dependence, cigarettes, uncomplicated: Secondary | ICD-10-CM | POA: Diagnosis not present

## 2017-07-11 DIAGNOSIS — J449 Chronic obstructive pulmonary disease, unspecified: Secondary | ICD-10-CM | POA: Insufficient documentation

## 2017-07-11 DIAGNOSIS — Z79899 Other long term (current) drug therapy: Secondary | ICD-10-CM | POA: Insufficient documentation

## 2017-07-11 DIAGNOSIS — R45851 Suicidal ideations: Secondary | ICD-10-CM | POA: Diagnosis not present

## 2017-07-11 DIAGNOSIS — E1140 Type 2 diabetes mellitus with diabetic neuropathy, unspecified: Secondary | ICD-10-CM | POA: Insufficient documentation

## 2017-07-11 DIAGNOSIS — E039 Hypothyroidism, unspecified: Secondary | ICD-10-CM | POA: Diagnosis not present

## 2017-07-11 DIAGNOSIS — I10 Essential (primary) hypertension: Secondary | ICD-10-CM | POA: Diagnosis not present

## 2017-07-11 DIAGNOSIS — F4325 Adjustment disorder with mixed disturbance of emotions and conduct: Secondary | ICD-10-CM | POA: Diagnosis not present

## 2017-07-11 DIAGNOSIS — Z046 Encounter for general psychiatric examination, requested by authority: Secondary | ICD-10-CM | POA: Insufficient documentation

## 2017-07-11 DIAGNOSIS — F319 Bipolar disorder, unspecified: Secondary | ICD-10-CM | POA: Diagnosis present

## 2017-07-11 NOTE — ED Notes (Signed)
Bed: [facility] Expected date:  Expected time:  Means of arrival:  Comments: 
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
## 2017-07-11 NOTE — Discharge Instructions (Signed)
Follow up with psychiatrist and pain management for refills on needed medications.
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
## 2017-07-11 NOTE — ED Triage Notes (Addendum)
Pt presents w/ [employer] while after Officers sent out for a wellness check.  Pt reports she was trying to to pick up her psych medication and the pharmacist wouldn't give it to her due to a contraindication w/ another medication.  Pt reports she told the pharmacist "if I kill myself, it's going to be your fault."  Pt is adamant that she was just mad.  Denies SI/HI.  Sts she hears "Joe" all the time and she was needing her medication to lessen the talking.  Sts she was recently in the psych hospital.  She has a follow-up appointment on 2/18.    Pt, also, reports she took expired pills of the medication the pharmacist wouldn't give her.      Pt is alert and oriented.  She reports she was just discharged yesterday from somewhere in [place].

## 2017-08-04 ENCOUNTER — Encounter (HOSPITAL_COMMUNITY): Payer: Self-pay | Admitting: Family Medicine

## 2017-08-04 ENCOUNTER — Ambulatory Visit (HOSPITAL_COMMUNITY)
Admission: EM | Admit: 2017-08-04 | Discharge: 2017-08-04 | Disposition: A | Discharge status: Home / Self Care | Payer: Medicare Other | Attending: Family Medicine | Admitting: Family Medicine

## 2017-08-04 DIAGNOSIS — J019 Acute sinusitis, unspecified: Secondary | ICD-10-CM

## 2017-08-04 MED ORDER — AMOXICILLIN-POT CLAVULANATE 875-125 MG PO TABS: 1.0000 | ORAL_TABLET | Freq: Two times a day (BID) | ORAL | 0 refills | Status: AC

## 2017-08-04 NOTE — Discharge Instructions (Signed)
Treating you for a sinus infection with Augmentin.  Please take twice daily for 10 days.  Follow further symptom recommendations below that antibiotic works.  His major symptoms to start improving over the next week.  1. Take a daily allergy pill/anti-histamine like Zyrtec, Claritin, or Store brand consistently for 2 weeks  2. For congestion you may try an oral decongestant like Mucinex or sudafed. You may also try intranasal flonase nasal spray or saline irrigations (neti pot, sinus cleanse).  You may also try Afrin nasal spray, but do not use this more than 3 days as this can cause rebound congestion.  3. For your sore throat you may try cepacol lozenges, salt water gargles, throat spray. Treatment of congestion may also help your sore throat.  4. For cough you may try Delsym, Robitussin  5. Take Tylenol or Ibuprofen to help with pain/inflammation  6. Stay hydrated, drink plenty of fluids to keep throat coated and less irritated  Honey Tea For cough/sore throat try using a honey-based tea. Use 3 teaspoons of honey with juice squeezed from half lemon. Place shaved pieces of ginger into 1/2-1 cup of water and warm over stove top. Then mix the ingredients and repeat every 4 hours as needed.

## 2017-08-04 NOTE — ED Provider Notes (Signed)
[place]    CSN: [id number] Arrival date & time: 08/04/17  1228     History   Chief Complaint Chief Complaint  Patient presents with  . Facial Pain    HPI [name] is a 52 y.o. female history of COPD; Patient is presenting with URI symptoms-mainly congestion.denies cough and sore throat.  Symptoms have been going on for approximately 1 month, she had some improvement last week but symptoms worsened in the past couple days.  Patient's main complaints are burning sensation in nose, constant drainage. Patient has tried Zyrtec, Alka-Seltzer cold and flu, with minimal relief. Denies fever, nausea, vomiting, diarrhea. Denies shortness of breath and chest pain.   Previous history of diabetes, had gastric bypass has since resolved.  A1c is running around 6.   HPI  Past Medical History:  Diagnosis Date  . Abdominal pain   . Accidental drug overdose April 2013  . Anxiety   . Atrial fibrillation ([place]) 09/29/11   converted spontaneously  . Chronic back pain   . Chronic knee pain   . Chronic nausea   . Chronic pain   . COPD (chronic obstructive pulmonary disease) ([place])   . Depression   . Diabetes mellitus    states her doctor took her off all DM meds in past month  . Diabetic neuropathy ([place])   . Dyspnea    with exertion   . GERD (gastroesophageal reflux disease)   . Headache(784.0)    migraines   . HTN (hypertension)    not on meds since in a year   . Hyperlipidemia   . Hypothyroidism    not on meds in a while   . Mental disorder    Bipolar and schizophrenic  . Requires supplemental oxygen    as needed per patient   . Schizophrenia ([place])   . Tobacco abuse     Patient Active Problem List   Diagnosis Date Noted  . 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 Abdominal pain 04/18/2016  . Dehydration 04/17/2016  . Morbid obesity ([place]) 03/25/2016  . Type 2 diabetes mellitus with diabetic polyneuropathy, with  long-term current use of insulin ([place]) 09/06/2015  . Vaginitis and vulvovaginitis, unspecified 04/19/2013  . BV (bacterial vaginosis) 04/19/2013  . HTN (hypertension) 11/04/2011  . Hypokalemia 10/02/2011  . atrial fibrillation 09/30/2011  . Acute respiratory failure ([place]) 09/29/2011  . COPD (chronic obstructive pulmonary disease) ([place]) 09/29/2011  . Accidental drug overdose 09/29/2011    Past Surgical History:  Procedure Laterality Date  . ABDOMINAL HYSTERECTOMY    . BLADDER SUSPENSION  03/04/2011   Procedure: [facility] PROCEDURE;  Surgeon: Elayne Snare MacDiarmid;  Location: [facility];  Service: Urology;  Laterality: N/A;  . CYSTOCELE REPAIR  03/04/2011   Procedure: ANTERIOR REPAIR (CYSTOCELE);  Surgeon: Reece Packer;  Location: [facility];  Service: Urology;  Laterality: N/A;  . CYSTOSCOPY  03/04/2011   Procedure: CYSTOSCOPY;  Surgeon: Elayne Snare MacDiarmid;  Location: [facility];  Service: Urology;  Laterality: N/A;  . ESOPHAGOGASTRODUODENOSCOPY (EGD) WITH PROPOFOL N/A 05/12/2017   Procedure: ESOPHAGOGASTRODUODENOSCOPY (EGD) WITH PROPOFOL;  Surgeon: Alphonsa Overall, MD;  Location: [facility];  Service: General;  Laterality: N/A;  . GASTRIC ROUX-EN-Y N/A 03/25/2016   Procedure: LAPAROSCOPIC ROUX-EN-Y GASTRIC BYPASS WITH UPPER ENDOSCOPY;  Surgeon: Excell Seltzer, MD;  Location: [facility];  Service: General;  Laterality: N/A;  . KNEE SURGERY    . LAPAROSCOPIC ASSISTED VAGINAL HYSTERECTOMY  03/04/2011   Procedure: LAPAROSCOPIC ASSISTED  VAGINAL HYSTERECTOMY;  Surgeon: Cyril Mourning, MD;  Location: [facility];  Service: Gynecology;  Laterality: N/A;    OB History    Gravida Para Term Preterm AB Living   3 3     0 3   SAB TAB Ectopic Multiple Live Births   0 0 0 0         Home Medications    Prior to Admission medications   Medication Sig Start Date End Date Taking? Authorizing Provider  acetaminophen (TYLENOL) 160 MG/5ML liquid Take 10 mLs (320 mg total) by mouth every 4 (four) hours as needed for  fever. Patient not taking: Reported on 04/09/2016 03/27/16   Kinsinger, Arta Bruce, MD  albuterol [facility] HFA) [phone] Base) MCG/ACT inhaler Inhale 2 puffs into the lungs every 6 (six) hours as needed for wheezing or shortness of breath.    [provider]  amoxicillin-clavulanate (AUGMENTIN) 875-125 MG tablet Take 1 tablet by mouth 2 (two) times daily for 10 days. 08/04/17 08/14/17  Wieters, Hallie C, PA-C  calcium carbonate (OS-CAL) 1250 (500 Ca) MG chewable tablet Chew 1 tablet 2 (two) times daily by mouth.    [provider]  carisoprodol (SOMA) 350 MG tablet Take 350 mg 3 (three) times daily as needed by mouth for muscle spasms.  04/30/17   [provider]  cyclobenzaprine (FLEXERIL) 5 MG tablet Take 1 tablet (5 mg total) by mouth 3 (three) times daily as needed for muscle spasms. 06/14/17   Drenda Freeze, MD  cycloSPORINE (RESTASIS) 0.05 % ophthalmic emulsion Place 1 drop into both eyes 2 (two) times daily.    [provider]  Dextrose-Fructose-Sod Citrate Almon [place]) [phone] MG CHEW Chew 3 tablets as needed by mouth (for nausea and upset stomach).    [provider]  diclofenac sodium (VOLTAREN) 1 % GEL Apply 4 g 4 (four) times daily as needed topically (for pain).    [provider]  dicyclomine (BENTYL) 20 MG tablet Take 1 tablet (20 mg total) by mouth 2 (two) times daily. 06/27/17   Tanna Furry, MD  diphenoxylate-atropine (LOMOTIL) 2.5-0.025 MG tablet Take 1 tablet by mouth 4 (four) times daily as needed for diarrhea or loose stools. 06/27/17   Tanna Furry, MD  fentaNYL (DURAGESIC - DOSED MCG/HR) 100 MCG/HR Place 100 mcg every 3 (three) days onto the skin.    [provider]  HYDROmorphone (DILAUDID) 8 MG tablet Take 8 mg every 6 (six) hours as needed by mouth for severe pain.     [provider]  hydrOXYzine (ATARAX/VISTARIL) 50 MG tablet Take 50 mg by mouth 3 (three) times daily as needed for itching.  01/14/16    [provider]  ibuprofen (ADVIL,MOTRIN) 800 MG tablet Take 800 mg every 8 (eight) hours as needed by mouth for headache or moderate pain.     [provider]  Multiple Vitamins-Iron (MULTIVITAMIN/IRON PO) Take 1 tablet 2 (two) times daily by mouth.    [provider]  NARCAN 4 MG/0.1ML LIQD nasal spray kit  05/30/17   [provider]  ondansetron (ZOFRAN ODT) 4 MG disintegrating tablet Take 1 tablet (4 mg total) by mouth every 8 (eight) hours as needed for nausea. 06/27/17   Tanna Furry, MD  pantoprazole (PROTONIX) 40 MG tablet Take 1 tablet (40 mg total) by mouth 2 (two) times daily. Patient not taking: Reported on 06/08/2017 05/12/17   Alphonsa Overall, MD  Polyethyl Glycol-Propyl Glycol (SYSTANE) 0.4-0.3 % GEL ophthalmic gel Place 1 application  as needed into both eyes (for dry eyes).    [provider]  pregabalin (LYRICA) 100 MG capsule Take 100 mg 3 (three) times daily by mouth.     [provider]  promethazine (PHENERGAN) 25 MG tablet Take 1 tablet (25 mg total) by mouth every 6 (six) hours as needed for nausea or vomiting. 04/14/16   Long, Wonda Olds, MD  SPIRIVA HANDIHALER 18 MCG inhalation capsule Place 1 puff daily as needed into inhaler and inhale (for shortness of breath or wheezing).  04/08/16   [provider]  SUMAtriptan 6 MG/0.5ML SOAJ Inject 6 mg daily as needed into the muscle (for migraines).  06/09/15   [provider]  tiZANidine (ZANAFLEX) 4 MG tablet Take 4 mg 2 (two) times daily as needed by mouth for muscle spasms.    [provider]    Family History Family History  Problem Relation Age of Onset  . Heart attack Father        70s  . Diabetes Mother   . Heart disease Mother   . Hypertension Mother   . Heart attack Sister        88  . COPD Other   . Breast cancer Neg Hx     Social History Social History   Tobacco Use  . Smoking status: Current Every Day Smoker    Packs/day: 0.50     Types: Cigarettes  . Smokeless tobacco: Never Used  Substance Use Topics  . Alcohol use: Yes  . Drug use: No     Allergies   Morphine and related and Aspirin   Review of Systems Review of Systems  Constitutional: Negative for chills, fatigue and fever.  HENT: Positive for congestion, rhinorrhea and sinus pressure. Negative for ear pain, sore throat and trouble swallowing.   Respiratory: Negative for cough, chest tightness and shortness of breath.   Cardiovascular: Negative for chest pain.  Gastrointestinal: Negative for abdominal pain, nausea and vomiting.  Musculoskeletal: Negative for myalgias.  Skin: Negative for rash.  Neurological: Negative for dizziness, light-headedness and headaches.     Physical Exam Triage Vital Signs ED Triage Vitals [08/04/17 1259]  Enc Vitals Group     BP 133/86     Pulse Rate (!) 102     Resp 18     Temp 98.4 F (36.9 C)     Temp Source Oral     SpO2 100 %     Weight      Height      Head Circumference      Peak Flow      Pain Score      Pain Loc      Pain Edu?      Excl. in [place]?    No data found.  Updated Vital Signs BP 133/86   Pulse (!) 102   Temp 98.4 F (36.9 C) (Oral)   Resp 18   LMP 01/08/2011   SpO2 100%   Visual Acuity Right Eye Distance:   Left Eye Distance:   Bilateral Distance:    Right Eye Near:   Left Eye Near:    Bilateral Near:     Physical Exam  Constitutional: She appears well-developed and well-nourished. No distress.  HENT:  Head: Normocephalic and atraumatic.  Bilateral TMs nonerythematous, nasal mucosa and turbinates erythematous with rhinorrhea present, posterior oropharynx erythematous without tonsillar swelling or exudate.  Eyes: Conjunctivae are normal.  Neck: Neck supple.  Cardiovascular: Normal rate and regular rhythm.  No murmur  heard. Pulmonary/Chest: Effort normal and breath sounds normal. No respiratory distress.  Breathing comfortably at rest, clear to auscultation bilaterally, no  adventitious sounds appreciated  Abdominal: Soft. There is no tenderness.  Musculoskeletal: She exhibits no edema.  Neurological: She is alert.  Skin: Skin is warm and dry.  Psychiatric: She has a normal mood and affect.  Nursing note and vitals reviewed.    UC Treatments / Results  Labs (all labs ordered are listed, but only abnormal results are displayed) Labs Reviewed - No data to display  EKG  EKG Interpretation None       Radiology No results found.  Procedures Procedures (including critical care time)  Medications Ordered in UC Medications - No data to display   Initial Impression / Assessment and Plan / UC Course  I have reviewed the triage vital signs and the nursing notes.  Pertinent labs & imaging results that were available during my care of the patient were reviewed by me and considered in my medical decision making (see chart for details).     Patient presents with symptoms likely from bacterial sinusitis versus viral upper respiratory infection. Do not suspect underlying cardiopulmonary process. Symptoms seem unlikely related to ACS, CHF or COPD exacerbations, pneumonia, pneumothorax. Patient is nontoxic appearing and not in need of emergent medical intervention.  Will provide Augmentin for 10 days.  Recommended to continue Zyrtec, add in Flonase, may try Sudafed as an alternative to Flonase.  May also use Afrin for maximum of 3 days. Recommended symptom control with over the counter medications: Daily oral anti-histamine, Oral decongestant or IN corticosteroid, saline irrigations, cepacol lozenges, Robitussin, Delsym, honey tea.  Discussed strict return precautions. Patient verbalized understanding and is agreeable with plan.   Final Clinical Impressions(s) / UC Diagnoses   Final diagnoses:  Acute sinusitis with symptoms > 10 days    ED Discharge Orders        Ordered    amoxicillin-clavulanate (AUGMENTIN) 875-125 MG tablet  2 times daily      08/04/17 1321       Controlled Substance Prescriptions [place] Controlled Substance Registry consulted? Not Applicable   Janith Lima, [place] 08/04/17 1330

## 2017-08-04 NOTE — ED Triage Notes (Signed)
Pt here for possible sinus infection. sts she had the same last week, she became better and then it returned.

## 2017-08-10 ENCOUNTER — Ambulatory Visit (INDEPENDENT_AMBULATORY_CARE_PROVIDER_SITE_OTHER): Payer: Medicare Other | Admitting: Psychiatry

## 2017-08-10 ENCOUNTER — Encounter (HOSPITAL_COMMUNITY): Payer: Self-pay | Admitting: Psychiatry

## 2017-08-10 VITALS — BP 129/84 | HR 97 | Ht 62.0 in | Wt 130.0 lb

## 2017-08-10 DIAGNOSIS — Z79899 Other long term (current) drug therapy: Secondary | ICD-10-CM | POA: Diagnosis not present

## 2017-08-10 DIAGNOSIS — F17210 Nicotine dependence, cigarettes, uncomplicated: Secondary | ICD-10-CM | POA: Diagnosis not present

## 2017-08-10 DIAGNOSIS — F2089 Other schizophrenia: Secondary | ICD-10-CM

## 2017-08-10 MED ORDER — LATUDA 80 MG PO TABS: 80.0000 mg | ORAL_TABLET | Freq: Every day | ORAL | 1 refills | Status: DC

## 2017-08-10 NOTE — Patient Instructions (Signed)
I would suggest pursuit of ACT services and would be happy to provide you a referral and letter of support.  ACT services in this area include the following, but you should also coordinate with your insurance provider for further resources.   [facility], [place] [address] TEL: [phone] FAX: [place], [address] [place], [place] [address] Phone: [phone] Fax: [phone]  [name] [address] [place], [place] [address] [phone]  [facility] [address] [place]. [place], [place] [address] Phone [phone]  Fax [phone]

## 2017-08-10 NOTE — Progress Notes (Signed)
Psychiatric Initial Adult Assessment   Patient Identification: [name] MRN:  [id number] Date of Evaluation:  08/10/2017 Referral Source: self Chief Complaint:  psych med management Visit Diagnosis:    ICD-10-CM   1. Other schizophrenia ([place]) F20.89 LATUDA 80 MG TABS tablet    History of Present Illness:  [name] presents for psychiatric assessment.  She was recently discharged from [facility] for depression and passive suicidal thoughts approximately 1 month ago.  She reports that she was seen for a psychiatric assessment recently and told that she had a high level of needs and was not appropriate for that clinic.  She presents today seeking resources for chronic schizophrenia.  She reports that she has a voice in her head, Joe, and she sees and hears him constantly.  She speaks fondly of Wille Glaser and reports that he is always there for her.  She does not present with any disorganization, nor does she present with any response to internal stimuli.  She denies any delusion of persecution, but does endorse paranoia and general distrust of others, complicated by significantly traumatizing past.  She reports that she has been psychiatrically hospitalized 5 times, because she will stop and start her medications, and her depression will get worse.  She is on a complex medication regimen for pain management including Zanaflex, Dilaudid, fentanyl, Flexeril, Lyrica.  She reports that she is taking Latuda 80 mg daily as prescribed for schizophrenia and for depression.  She brings up clonazepam on several occasions, inquiring if this can be started.  She presents today with her ex-husband.  She reports that she has a hard time leaving the home and requires substantial support in order to take care of herself, leave the home, or do anything outside of the house.  I spent time educating her about the value of act team resources, and suggested that that would be an appropriate place for her to start  given the severity of her illness, and psychiatric hospitalizations.  She denies any thoughts to harm herself or others.  She reports that Wille Glaser does not tend to give her any commands to harm herself.  She denies any episodes of hearing more than 5-7 voices at once or crowd of voices.  With regard to medication, I agreed to provide her with a bridge refill of Latuda so that she can initiate psychiatric care and follow-up with the act services as referred to.  Associated Signs/Symptoms: Depression Symptoms:  depressed mood, psychomotor retardation, fatigue, feelings of worthlessness/guilt, anxiety, (Hypo) Manic Symptoms:  Impulsivity, Irritable Mood, Anxiety Symptoms:  none Psychotic Symptoms:  Delusions, Hallucinations: Auditory Visual Paranoia, PTSD Symptoms: Significant history of childhood trauma  Past Psychiatric History: Psychiatric hospitalizations, 5+, last hospitalized 1-2 months ago, has most recently been managed by [facility] behavioral  Previous Psychotropic Medications: Yes   Substance Abuse History in the last 12 months:  Yes.    Consequences of Substance Abuse: Opiate dependence, prescribed  Past Medical History:  Past Medical History:  Diagnosis Date  . Abdominal pain   . Accidental drug overdose April 2013  . Anxiety   . Atrial fibrillation ([place]) 09/29/11   converted spontaneously  . Chronic back pain   . Chronic knee pain   . Chronic nausea   . Chronic pain   . COPD (chronic obstructive pulmonary disease) ([place])   . Depression   . Diabetes mellitus    states her doctor took her off all DM meds in past month  . Diabetic neuropathy ([place])   .  Dyspnea    with exertion   . GERD (gastroesophageal reflux disease)   . Headache(784.0)    migraines   . HTN (hypertension)    not on meds since in a year   . Hyperlipidemia   . Hypothyroidism    not on meds in a while   . Mental disorder    Bipolar and schizophrenic  . Requires supplemental oxygen    as needed  per patient   . Schizophrenia ([place])   . Tobacco abuse     Past Surgical History:  Procedure Laterality Date  . ABDOMINAL HYSTERECTOMY    . BLADDER SUSPENSION  03/04/2011   Procedure: [facility] PROCEDURE;  Surgeon: Elayne Snare MacDiarmid;  Location: [place] [facility];  Service: Urology;  Laterality: N/A;  . CYSTOCELE REPAIR  03/04/2011   Procedure: ANTERIOR REPAIR (CYSTOCELE);  Surgeon: Reece Packer;  Location: [facility];  Service: Urology;  Laterality: N/A;  . CYSTOSCOPY  03/04/2011   Procedure: CYSTOSCOPY;  Surgeon: Elayne Snare MacDiarmid;  Location: [facility];  Service: Urology;  Laterality: N/A;  . ESOPHAGOGASTRODUODENOSCOPY (EGD) WITH PROPOFOL N/A 05/12/2017   Procedure: ESOPHAGOGASTRODUODENOSCOPY (EGD) WITH PROPOFOL;  Surgeon: Alphonsa Overall, MD;  Location: [facility];  Service: General;  Laterality: N/A;  . GASTRIC ROUX-EN-Y N/A 03/25/2016   Procedure: LAPAROSCOPIC ROUX-EN-Y GASTRIC BYPASS WITH UPPER ENDOSCOPY;  Surgeon: Excell Seltzer, MD;  Location: [facility];  Service: General;  Laterality: N/A;  . KNEE SURGERY    . LAPAROSCOPIC ASSISTED VAGINAL HYSTERECTOMY  03/04/2011   Procedure: LAPAROSCOPIC ASSISTED VAGINAL HYSTERECTOMY;  Surgeon: Cyril Mourning, MD;  Location: [facility];  Service: Gynecology;  Laterality: N/A;    Family Psychiatric History: none  Family History:  Family History  Problem Relation Age of Onset  . Heart attack Father        77s  . Diabetes Mother   . Heart disease Mother   . Hypertension Mother   . Heart attack Sister        62  . COPD Other   . Breast cancer Neg Hx     Social History:   Social History   Socioeconomic History  . Marital status: Legally Separated    Spouse name: None  . Number of children: None  . Years of education: None  . Highest education level: None  Social Needs  . Financial resource strain: None  . Food insecurity - worry: None  . Food insecurity - inability: None  . Transportation needs - medical: None  . Transportation needs -  non-medical: None  Occupational History  . None  Tobacco Use  . Smoking status: Current Every Day Smoker    Packs/day: 0.50    Types: Cigarettes  . Smokeless tobacco: Never Used  Substance and Sexual Activity  . Alcohol use: Yes  . Drug use: No  . Sexual activity: Yes    Partners: Male  Other Topics Concern  . None  Social History Narrative  . None    Additional Social History: Has 7 grandchildren, 3 children, lives in [place], on disability for 13 years from injuries related to her previous job  Allergies:   Allergies  Allergen Reactions  . Morphine And Related Nausea And Vomiting and Other (See Comments)    Pt stated she is not allergic to medication  . Aspirin Nausea And Vomiting and Other (See Comments)    Ok to take tylenol or ibuprofen    Metabolic Disorder Labs: Lab Results  Component Value Date   HGBA1C 9.0 02/08/2016  No results found for: PROLACTIN No results found for: CHOL, TRIG, HDL, CHOLHDL, VLDL, LDLCALC   Current Medications: Current Outpatient Medications  Medication Sig Dispense Refill  . albuterol (PROAIR HFA) 108 (90 Base) MCG/ACT inhaler Inhale 2 puffs into the lungs every 6 (six) hours as needed for wheezing or shortness of breath.    Marland Kitchen amoxicillin-clavulanate (AUGMENTIN) 875-125 MG tablet Take 1 tablet by mouth 2 (two) times daily for 10 days. 20 tablet 0  . calcium carbonate (OS-CAL) 1250 (500 Ca) MG chewable tablet Chew 1 tablet 2 (two) times daily by mouth.    . carisoprodol (SOMA) 350 MG tablet Take 350 mg 3 (three) times daily as needed by mouth for muscle spasms.   0  . cyclobenzaprine (FLEXERIL) 5 MG tablet Take 1 tablet (5 mg total) by mouth 3 (three) times daily as needed for muscle spasms. 5 tablet 0  . cycloSPORINE (RESTASIS) 0.05 % ophthalmic emulsion Place 1 drop into both eyes 2 (two) times daily.    . diclofenac sodium (VOLTAREN) 1 % GEL Apply 4 g 4 (four) times daily as needed topically (for pain).    Marland Kitchen dicyclomine (BENTYL)  20 MG tablet Take 1 tablet (20 mg total) by mouth 2 (two) times daily. 20 tablet 0  . fentaNYL (DURAGESIC - DOSED MCG/HR) 100 MCG/HR Place 100 mcg every 3 (three) days onto the skin.    Marland Kitchen HYDROmorphone (DILAUDID) 8 MG tablet Take 8 mg every 6 (six) hours as needed by mouth for severe pain.     . hydrOXYzine (ATARAX/VISTARIL) 50 MG tablet Take 50 mg by mouth 3 (three) times daily as needed for itching.     Marland Kitchen ibuprofen (ADVIL,MOTRIN) 800 MG tablet Take 800 mg every 8 (eight) hours as needed by mouth for headache or moderate pain.     . Multiple Vitamins-Iron (MULTIVITAMIN/IRON PO) Take 1 tablet 2 (two) times daily by mouth.    Karma Greaser 4 MG/0.1ML LIQD nasal spray kit     . pantoprazole (PROTONIX) 40 MG tablet Take 1 tablet (40 mg total) by mouth 2 (two) times daily. 60 tablet 2  . Polyethyl Glycol-Propyl Glycol (SYSTANE) 0.4-0.3 % GEL ophthalmic gel Place 1 application as needed into both eyes (for dry eyes).    . pregabalin (LYRICA) 100 MG capsule Take 100 mg 3 (three) times daily by mouth.     . promethazine (PHENERGAN) 25 MG tablet Take 1 tablet (25 mg total) by mouth every 6 (six) hours as needed for nausea or vomiting. 30 tablet 0  . SPIRIVA HANDIHALER 18 MCG inhalation capsule Place 1 puff daily as needed into inhaler and inhale (for shortness of breath or wheezing).     . SUMAtriptan 6 MG/0.5ML SOAJ Inject 6 mg daily as needed into the muscle (for migraines).   5  . tiZANidine (ZANAFLEX) 4 MG tablet Take 4 mg 2 (two) times daily as needed by mouth for muscle spasms.    . diphenoxylate-atropine (LOMOTIL) 2.5-0.025 MG tablet Take 1 tablet by mouth 4 (four) times daily as needed for diarrhea or loose stools. (Patient not taking: Reported on 08/10/2017) 30 tablet 0  . LATUDA 80 MG TABS tablet Take 1 tablet (80 mg total) by mouth daily. 30 tablet 1   No current facility-administered medications for this visit.     Neurologic: Headache: Negative Seizure:  Negative Paresthesias:Yes  Musculoskeletal: Strength & Muscle Tone: within normal limits Gait & Station: normal Patient leans: N/A  Psychiatric Specialty Exam: ROS  Blood pressure 129/84, pulse  97, height 5' 2"  (1.575 m), weight 130 lb (59 kg), last menstrual period 01/08/2011.Body mass index is 23.78 kg/m.  General Appearance: Casual and Fairly Groomed  Eye Contact:  Fair  Speech:  Normal Rate  Volume:  Normal  Mood:  Anxious and Dysphoric  Affect:  Appropriate and Congruent  Thought Process:  Coherent, Goal Directed and Descriptions of Associations: Intact  Orientation:  Full (Time, Place, and Person)  Thought Content:  Logical  Suicidal Thoughts:  No  Homicidal Thoughts:  No  Memory:  Immediate;   Fair  Judgement:  Fair  Insight:  Shallow  Psychomotor Activity:  Normal  Concentration:  Attention Span: Fair  Recall:  [employer] of Knowledge:Fair  Language: Good  Akathisia:  Negative  Handed:  Right  AIMS (if indicated):  na  Assets:  Communication Skills Desire for Improvement Financial Resources/Insurance Housing Social Support  ADL's:  Intact  Cognition: WNL  Sleep:  fair    Treatment Plan Summary: [name] is a 52 year old female with a self-reported psychiatric history of schizophrenia, with greater than 5 psychiatric hospitalizations, for chronic lability and impulsive agitation.  She was most recently seen in the [facility] emergency department after threatening to kill herself if she did not get her medications, which were not refilled at [facility].  She has a history of impulsive agitation, and verbal acting out, but denies any intentions or plan to harm herself at this time.  She has significant difficulty participating in care due to anxiety and agoraphobia, chronic paranoia.  She continues to experience auditory and visual hallucinations, specifically has an entity, Joe, who she identifies as a visual hallucination and voice that only she can see.  She does not  present as grossly psychotic, and I am concerned about contributing personality and other mood factors.  Ultimately I do believe she requires a higher level of care than can be provided by this office, and have referred her for ACT services.  No further follow-up in this office indicated, and she is aware of emergency resources in the interim if any acute issues should arise.  1. Other schizophrenia ([place])     Status of current problems: new  Labs Ordered: No orders of the defined types were placed in this encounter.   Labs Reviewed: n/a  Collateral Obtained/Records Reviewed: n/a  Plan:  Referral for act team services No further follow-up with this writer I have recommended act team services, if the patient would prefer, she can return to her regular providers at [facility] behavioral Provided 30 days of bridge medication refill to allow her to establish psychiatric care - latuda 80 mg daily  I spent 30 minutes with the patient in direct face-to-face clinical care.  Greater than 50% of this time was spent in counseling and coordination of care with the patient.    Aundra Dubin, MD 2/18/20191:47 PM

## 2017-08-19 ENCOUNTER — Ambulatory Visit (HOSPITAL_COMMUNITY)
Admission: EM | Admit: 2017-08-19 | Discharge: 2017-08-19 | Disposition: A | Discharge status: Home / Self Care | Payer: Medicare Other | Attending: Family Medicine | Admitting: Family Medicine

## 2017-08-19 ENCOUNTER — Other Ambulatory Visit: Payer: Self-pay

## 2017-08-19 ENCOUNTER — Encounter (HOSPITAL_COMMUNITY): Payer: Self-pay | Admitting: Emergency Medicine

## 2017-08-19 DIAGNOSIS — J329 Chronic sinusitis, unspecified: Secondary | ICD-10-CM | POA: Diagnosis not present

## 2017-08-19 MED ORDER — METOCLOPRAMIDE HCL 5 MG/ML IJ SOLN
5.0000 mg | Freq: Once | INTRAMUSCULAR | Status: AC
  Administered 2017-08-19: 5 mg via INTRAMUSCULAR

## 2017-08-19 MED ORDER — METOCLOPRAMIDE HCL 5 MG/ML IJ SOLN
INTRAMUSCULAR | Status: AC
  Filled 2017-08-19: qty 2

## 2017-08-19 MED ORDER — KETOROLAC TROMETHAMINE 60 MG/2ML IM SOLN
60.0000 mg | Freq: Once | INTRAMUSCULAR | Status: AC
  Administered 2017-08-19: 60 mg via INTRAMUSCULAR

## 2017-08-19 MED ORDER — KETOROLAC TROMETHAMINE 60 MG/2ML IM SOLN
INTRAMUSCULAR | Status: AC
  Filled 2017-08-19: qty 2

## 2017-08-19 MED ORDER — DEXAMETHASONE SODIUM PHOSPHATE 10 MG/ML IJ SOLN
INTRAMUSCULAR | Status: AC
Start: 2017-08-19 — End: ?
  Filled 2017-08-19: qty 1

## 2017-08-19 MED ORDER — PREDNISONE 50 MG PO TABS: 50.0000 mg | ORAL_TABLET | Freq: Every day | ORAL | 0 refills | Status: AC

## 2017-08-19 MED ORDER — DEXAMETHASONE SODIUM PHOSPHATE 10 MG/ML IJ SOLN
10.0000 mg | Freq: Once | INTRAMUSCULAR | Status: AC
  Administered 2017-08-19: 10 mg via INTRAMUSCULAR

## 2017-08-19 MED ORDER — AMOXICILLIN-POT CLAVULANATE 875-125 MG PO TABS: 1.0000 | ORAL_TABLET | Freq: Two times a day (BID) | ORAL | 0 refills | Status: AC

## 2017-08-19 NOTE — Discharge Instructions (Signed)
Please redo trial of Augmentin. This will treat if there is infection, if symptoms continue to persist this may be more related to allergies.   Please take prednisone 50 mg daily for 5 days.  Restart daily allergy pill of Zyrtec or Claritin, store brand okay  Use Afrin 3 days on 3 days off.   Please follow up with your primary provider and/or Ear nose and throat if symptoms persisting.

## 2017-08-19 NOTE — ED Triage Notes (Signed)
Pt c/o nasal drainage, feeling "stopped up". Mild coughing.

## 2017-08-19 NOTE — ED Provider Notes (Signed)
[place]    CSN: [id number] Arrival date & time: 08/19/17  7829     History   Chief Complaint Chief Complaint  Patient presents with  . Nasal Congestion    HPI [name] is a 52 y.o. female history of COPD presenting with persistent nasal congestion.  She was seen here approximately 2 weeks ago and treated for acute sinusitis with a 10-day course of Augmentin.  Also advised daily allergy pill with Flonase, Afrin for 3 days.  Her symptoms in total have been going on for a month and a half.  Her main symptoms are congestion, sensation of feeling stopped up, difficulty breathing secondary to this.  She is also having a headache secondary to the pressure.  She endorses minimal coughing.  She also states that her eyes are frequently red, itchy and watery.  Denies fever.  Denies fever, nausea, vomiting, diarrhea, abdominal pain.  HPI  Past Medical History:  Diagnosis Date  . Abdominal pain   . Accidental drug overdose April 2013  . Anxiety   . Atrial fibrillation ([place]) 09/29/11   converted spontaneously  . Chronic back pain   . Chronic knee pain   . Chronic nausea   . Chronic pain   . COPD (chronic obstructive pulmonary disease) ([place])   . Depression   . Diabetes mellitus    states her doctor took her off all DM meds in past month  . Diabetic neuropathy ([place])   . Dyspnea    with exertion   . GERD (gastroesophageal reflux disease)   . Headache(784.0)    migraines   . HTN (hypertension)    not on meds since in a year   . Hyperlipidemia   . Hypothyroidism    not on meds in a while   . Mental disorder    Bipolar and schizophrenic  . Requires supplemental oxygen    as needed per patient   . Schizophrenia ([place])   . Tobacco abuse     Patient Active Problem List   Diagnosis Date Noted  . 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 Abdominal pain 04/18/2016  . Dehydration 04/17/2016  . Morbid obesity ([place]) 03/25/2016    . Type 2 diabetes mellitus with diabetic polyneuropathy, with long-term current use of insulin ([place]) 09/06/2015  . Vaginitis and vulvovaginitis, unspecified 04/19/2013  . BV (bacterial vaginosis) 04/19/2013  . HTN (hypertension) 11/04/2011  . Hypokalemia 10/02/2011  . atrial fibrillation 09/30/2011  . Acute respiratory failure ([place]) 09/29/2011  . COPD (chronic obstructive pulmonary disease) ([place]) 09/29/2011  . Accidental drug overdose 09/29/2011    Past Surgical History:  Procedure Laterality Date  . ABDOMINAL HYSTERECTOMY    . BLADDER SUSPENSION  03/04/2011   Procedure: [facility] PROCEDURE;  Surgeon: Elayne Snare MacDiarmid;  Location: [facility];  Service: Urology;  Laterality: N/A;  . CYSTOCELE REPAIR  03/04/2011   Procedure: ANTERIOR REPAIR (CYSTOCELE);  Surgeon: Reece Packer;  Location: [facility];  Service: Urology;  Laterality: N/A;  . CYSTOSCOPY  03/04/2011   Procedure: CYSTOSCOPY;  Surgeon: Elayne Snare MacDiarmid;  Location: [facility];  Service: Urology;  Laterality: N/A;  . ESOPHAGOGASTRODUODENOSCOPY (EGD) WITH PROPOFOL N/A 05/12/2017   Procedure: ESOPHAGOGASTRODUODENOSCOPY (EGD) WITH PROPOFOL;  Surgeon: Alphonsa Overall, MD;  Location: [facility];  Service: General;  Laterality: N/A;  . GASTRIC ROUX-EN-Y N/A 03/25/2016   Procedure: LAPAROSCOPIC ROUX-EN-Y GASTRIC BYPASS WITH UPPER ENDOSCOPY;  Surgeon: Excell Seltzer, MD;  Location: [facility];  Service: General;  Laterality: N/A;  . KNEE SURGERY    . LAPAROSCOPIC ASSISTED VAGINAL HYSTERECTOMY  03/04/2011   Procedure: LAPAROSCOPIC ASSISTED VAGINAL HYSTERECTOMY;  Surgeon: Cyril Mourning, MD;  Location: [facility];  Service: Gynecology;  Laterality: N/A;    OB History    Gravida Para Term Preterm AB Living   3 3     0 3   SAB TAB Ectopic Multiple Live Births   0 0 0 0         Home Medications    Prior to Admission medications   Medication Sig Start Date End Date Taking? Authorizing Provider  albuterol (PROAIR HFA) 108 (90 Base) MCG/ACT  inhaler Inhale 2 puffs into the lungs every 6 (six) hours as needed for wheezing or shortness of breath.    [provider]  amoxicillin-clavulanate (AUGMENTIN) 875-125 MG tablet Take 1 tablet by mouth every 12 (twelve) hours for 20 days. 08/19/17 09/08/17  Cathleen Yagi C, PA-C  calcium carbonate (OS-CAL) 1250 (500 Ca) MG chewable tablet Chew 1 tablet 2 (two) times daily by mouth.    [provider]  carisoprodol (SOMA) 350 MG tablet Take 350 mg 3 (three) times daily as needed by mouth for muscle spasms.  04/30/17   [provider]  cyclobenzaprine (FLEXERIL) 5 MG tablet Take 1 tablet (5 mg total) by mouth 3 (three) times daily as needed for muscle spasms. 06/14/17   Drenda Freeze, MD  cycloSPORINE (RESTASIS) 0.05 % ophthalmic emulsion Place 1 drop into both eyes 2 (two) times daily.    [provider]  diclofenac sodium (VOLTAREN) 1 % GEL Apply 4 g 4 (four) times daily as needed topically (for pain).    [provider]  dicyclomine (BENTYL) 20 MG tablet Take 1 tablet (20 mg total) by mouth 2 (two) times daily. 06/27/17   Tanna Furry, MD  diphenoxylate-atropine (LOMOTIL) 2.5-0.025 MG tablet Take 1 tablet by mouth 4 (four) times daily as needed for diarrhea or loose stools. Patient not taking: Reported on 08/10/2017 06/27/17   Tanna Furry, MD  fentaNYL (DURAGESIC - DOSED MCG/HR) 100 MCG/HR Place 100 mcg every 3 (three) days onto the skin.    [provider]  HYDROmorphone (DILAUDID) 8 MG tablet Take 8 mg every 6 (six) hours as needed by mouth for severe pain.     [provider]  hydrOXYzine (ATARAX/VISTARIL) 50 MG tablet Take 50 mg by mouth 3 (three) times daily as needed for itching.  01/14/16   [provider]  ibuprofen (ADVIL,MOTRIN) 800 MG tablet Take 800 mg every 8 (eight) hours as needed by mouth for headache or moderate pain.     [provider]  LATUDA 80 MG TABS tablet Take 1 tablet (80 mg total) by mouth daily.  08/10/17   Eksir, Richard Miu, MD  Multiple Vitamins-Iron (MULTIVITAMIN/IRON PO) Take 1 tablet 2 (two) times daily by mouth.    [provider]  NARCAN 4 MG/0.1ML LIQD nasal spray kit  05/30/17   [provider]  pantoprazole (PROTONIX) 40 MG tablet Take 1 tablet (40 mg total) by mouth 2 (two) times daily. 05/12/17   Alphonsa Overall, MD  Polyethyl Glycol-Propyl Glycol (SYSTANE) 0.4-0.3 % GEL ophthalmic gel Place 1 application as needed into both eyes (for dry eyes).    [provider]  predniSONE (DELTASONE) 50 MG tablet Take 1 tablet (50 mg total) by mouth daily for 5 days. 08/19/17 08/24/17  Akeema Broder C, PA-C  pregabalin (LYRICA) 100 MG capsule Take 100  mg 3 (three) times daily by mouth.     [provider]  promethazine (PHENERGAN) 25 MG tablet Take 1 tablet (25 mg total) by mouth every 6 (six) hours as needed for nausea or vomiting. 04/14/16   Long, Wonda Olds, MD  SPIRIVA HANDIHALER 18 MCG inhalation capsule Place 1 puff daily as needed into inhaler and inhale (for shortness of breath or wheezing).  04/08/16   [provider]  SUMAtriptan 6 MG/0.5ML SOAJ Inject 6 mg daily as needed into the muscle (for migraines).  06/09/15   [provider]  tiZANidine (ZANAFLEX) 4 MG tablet Take 4 mg 2 (two) times daily as needed by mouth for muscle spasms.    [provider]    Family History Family History  Problem Relation Age of Onset  . Heart attack Father        69s  . Diabetes Mother   . Heart disease Mother   . Hypertension Mother   . Heart attack Sister        86  . COPD Other   . Breast cancer Neg Hx     Social History Social History   Tobacco Use  . Smoking status: Current Every Day Smoker    Packs/day: 0.50    Types: Cigarettes  . Smokeless tobacco: Never Used  Substance Use Topics  . Alcohol use: Yes  . Drug use: No     Allergies   Morphine and related and Aspirin   Review of Systems Review of Systems    Constitutional: Negative for chills, fatigue and fever.  HENT: Positive for congestion, rhinorrhea and sinus pressure. Negative for ear pain, sore throat and trouble swallowing.   Respiratory: Positive for cough. Negative for chest tightness and shortness of breath.   Cardiovascular: Negative for chest pain.  Gastrointestinal: Negative for abdominal pain, nausea and vomiting.  Musculoskeletal: Negative for myalgias.  Skin: Negative for rash.  Neurological: Positive for headaches. Negative for dizziness and light-headedness.     Physical Exam Triage Vital Signs ED Triage Vitals [08/19/17 1016]  Enc Vitals Group     BP (!) 178/107     Pulse Rate 97     Resp 18     Temp (!) 97.4 F (36.3 C)     Temp src      SpO2 100 %     Weight      Height      Head Circumference      Peak Flow      Pain Score      Pain Loc      Pain Edu?      Excl. in [place]?    No data found.  Updated Vital Signs BP (!) 178/107   Pulse 97   Temp (!) 97.4 F (36.3 C)   Resp 18   LMP 01/08/2011   SpO2 100%   Visual Acuity Right Eye Distance:   Left Eye Distance:   Bilateral Distance:    Right Eye Near:   Left Eye Near:    Bilateral Near:     Physical Exam  Constitutional: She appears well-developed and well-nourished. No distress.  HENT:  Head: Normocephalic and atraumatic.  Bilateral TMs nonerythematous, nasal mucosa erythematous, rhinorrhea present.  Posterior oropharynx is mildly erythematous, no tonsillar enlargement or exudate.  Maxillary and frontal sinuses tender to palpation bilaterally.  Eyes: Conjunctivae are normal.  Conjunctival non erythematous, watery drainage present.  Neck: Neck supple.  Cardiovascular: Normal rate and regular rhythm.  No murmur  heard. Pulmonary/Chest: Effort normal and breath sounds normal. No respiratory distress.  Breathing comfortably at rest, CTA BL  Musculoskeletal: She exhibits no edema.  Neurological: She is alert.  Skin: Skin is warm and dry.   Psychiatric: She has a normal mood and affect.  Nursing note and vitals reviewed.    UC Treatments / Results  Labs (all labs ordered are listed, but only abnormal results are displayed) Labs Reviewed - No data to display  EKG  EKG Interpretation None       Radiology No results found.  Procedures Procedures (including critical care time)  Medications Ordered in UC Medications  ketorolac (TORADOL) injection 60 mg (not administered)  metoCLOPramide (REGLAN) injection 5 mg (not administered)  dexamethasone (DECADRON) injection 10 mg (not administered)     Initial Impression / Assessment and Plan / UC Course  I have reviewed the triage vital signs and the nursing notes.  Pertinent labs & imaging results that were available during my care of the patient were reviewed by me and considered in my medical decision making (see chart for details).     Patient with symptoms of sinusitis, acute versus chronic versus allergic rhinitis.  Will treat with a longer course of Augmentin, prednisone for 5 days, continue daily Zyrtec, may use Afrin 3 days on 3 days off.  Follow-up with ENT if symptoms persisting. Discussed strict return precautions. Patient verbalized understanding and is agreeable with plan.  Will give Toradol, Reglan and Decadron today to help with headache.  Tylenol and ibuprofen at home.  Patient has nausea and vomiting with aspirin, states she takes Tylenol and ibuprofen without issues.   Final Clinical Impressions(s) / UC Diagnoses   Final diagnoses:  Chronic sinusitis, unspecified location    ED Discharge Orders        Ordered    predniSONE (DELTASONE) 50 MG tablet  Daily     08/19/17 1107    amoxicillin-clavulanate (AUGMENTIN) 875-125 MG tablet  Every 12 hours     08/19/17 1107       Controlled Substance Prescriptions [place] Controlled Substance Registry consulted? Not Applicable   Janith Lima, [place] 08/19/17 1117

## 2017-09-12 ENCOUNTER — Emergency Department (HOSPITAL_COMMUNITY)
Admission: EM | Admit: 2017-09-12 | Discharge: 2017-09-12 | Disposition: A | Discharge status: Home / Self Care | Payer: Medicare Other | Attending: Emergency Medicine | Admitting: Emergency Medicine

## 2017-09-12 ENCOUNTER — Encounter (HOSPITAL_COMMUNITY): Payer: Self-pay | Admitting: *Deleted

## 2017-09-12 DIAGNOSIS — F17210 Nicotine dependence, cigarettes, uncomplicated: Secondary | ICD-10-CM | POA: Diagnosis not present

## 2017-09-12 DIAGNOSIS — I10 Essential (primary) hypertension: Secondary | ICD-10-CM | POA: Insufficient documentation

## 2017-09-12 DIAGNOSIS — E119 Type 2 diabetes mellitus without complications: Secondary | ICD-10-CM | POA: Insufficient documentation

## 2017-09-12 DIAGNOSIS — Z79899 Other long term (current) drug therapy: Secondary | ICD-10-CM | POA: Insufficient documentation

## 2017-09-12 DIAGNOSIS — G43809 Other migraine, not intractable, without status migrainosus: Secondary | ICD-10-CM | POA: Insufficient documentation

## 2017-09-12 DIAGNOSIS — J449 Chronic obstructive pulmonary disease, unspecified: Secondary | ICD-10-CM | POA: Insufficient documentation

## 2017-09-12 DIAGNOSIS — R51 Headache: Secondary | ICD-10-CM | POA: Diagnosis present

## 2017-09-12 MED ORDER — METOCLOPRAMIDE HCL 5 MG/ML IJ SOLN
10.0000 mg | Freq: Once | INTRAMUSCULAR | Status: AC
  Administered 2017-09-12: 10 mg via INTRAMUSCULAR
  Filled 2017-09-12: qty 2

## 2017-09-12 MED ORDER — SODIUM CHLORIDE 0.9 % IV BOLUS (SEPSIS)
1000.0000 mL | Freq: Once | INTRAVENOUS | Status: AC
  Administered 2017-09-12: 1000 mL via INTRAVENOUS

## 2017-09-12 MED ORDER — DIPHENHYDRAMINE HCL 50 MG/ML IJ SOLN
25.0000 mg | Freq: Once | INTRAMUSCULAR | Status: AC
  Administered 2017-09-12: 25 mg via INTRAMUSCULAR
  Filled 2017-09-12: qty 1

## 2017-09-12 MED ORDER — LORAZEPAM 2 MG/ML IJ SOLN
1.0000 mg | Freq: Once | INTRAMUSCULAR | Status: AC
  Administered 2017-09-12: 1 mg via INTRAMUSCULAR
  Filled 2017-09-12: qty 1

## 2017-09-12 NOTE — ED Triage Notes (Signed)
Pt complains of migraine and nausea since this morning. Pt has tried ODT zofran and Imitrex w/o relief.

## 2017-09-12 NOTE — ED Provider Notes (Signed)
[facility] DEPT Provider Note   CSN: [id number] Arrival date & time: 09/12/17  1711     History   Chief Complaint Chief Complaint  Patient presents with  . Migraine    HPI [name] is a 52 y.o. female.  52 year old with history of migraines presents with her usual migraine which is characterizes dullness was starts at the base of her neck and goes to the left side of her head.  Has had nausea and vomiting.  Denies any visual loss or vision changes.  No fever or chills or neck pain.  Mild photophobia.  Has used Imitrex without relief.  Denies recent history of head injury.     Past Medical History:  Diagnosis Date  . Abdominal pain   . Accidental drug overdose April 2013  . Anxiety   . Atrial fibrillation ([place]) 09/29/11   converted spontaneously  . Chronic back pain   . Chronic knee pain   . Chronic nausea   . Chronic pain   . COPD (chronic obstructive pulmonary disease) ([place])   . Depression   . Diabetes mellitus    states her doctor took her off all DM meds in past month  . Diabetic neuropathy ([place])   . Dyspnea    with exertion   . GERD (gastroesophageal reflux disease)   . Headache(784.0)    migraines   . HTN (hypertension)    not on meds since in a year   . Hyperlipidemia   . Hypothyroidism    not on meds in a while   . Mental disorder    Bipolar and schizophrenic  . Requires supplemental oxygen    as needed per patient   . Schizophrenia ([place])   . Tobacco abuse     Patient Active Problem List   Diagnosis Date Noted  . 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 Abdominal pain 04/18/2016  . Dehydration 04/17/2016  . Morbid obesity ([place]) 03/25/2016  . Type 2 diabetes mellitus with diabetic polyneuropathy, with long-term current use of insulin ([place]) 09/06/2015  . Vaginitis and vulvovaginitis, unspecified 04/19/2013  . BV (bacterial vaginosis) 04/19/2013  . HTN (hypertension)  11/04/2011  . Hypokalemia 10/02/2011  . atrial fibrillation 09/30/2011  . Acute respiratory failure ([place]) 09/29/2011  . COPD (chronic obstructive pulmonary disease) ([place]) 09/29/2011  . Accidental drug overdose 09/29/2011    Past Surgical History:  Procedure Laterality Date  . ABDOMINAL HYSTERECTOMY    . BLADDER SUSPENSION  03/04/2011   Procedure: [facility] PROCEDURE;  Surgeon: Elayne Snare MacDiarmid;  Location: [facility];  Service: Urology;  Laterality: N/A;  . CYSTOCELE REPAIR  03/04/2011   Procedure: ANTERIOR REPAIR (CYSTOCELE);  Surgeon: Reece Packer;  Location: [facility];  Service: Urology;  Laterality: N/A;  . CYSTOSCOPY  03/04/2011   Procedure: CYSTOSCOPY;  Surgeon: Elayne Snare MacDiarmid;  Location: [facility];  Service: Urology;  Laterality: N/A;  . ESOPHAGOGASTRODUODENOSCOPY (EGD) WITH PROPOFOL N/A 05/12/2017   Procedure: ESOPHAGOGASTRODUODENOSCOPY (EGD) WITH PROPOFOL;  Surgeon: Alphonsa Overall, MD;  Location: [facility];  Service: General;  Laterality: N/A;  . GASTRIC ROUX-EN-Y N/A 03/25/2016   Procedure: LAPAROSCOPIC ROUX-EN-Y GASTRIC BYPASS WITH UPPER ENDOSCOPY;  Surgeon: Excell Seltzer, MD;  Location: [facility];  Service: General;  Laterality: N/A;  . KNEE SURGERY    . LAPAROSCOPIC ASSISTED VAGINAL HYSTERECTOMY  03/04/2011   Procedure: LAPAROSCOPIC ASSISTED VAGINAL HYSTERECTOMY;  Surgeon: Cyril Mourning, MD;  Location: [facility];  Service: Gynecology;  Laterality: N/A;  OB History    Gravida  3   Para  3   Term      Preterm      AB  0   Living  3     SAB  0   TAB  0   Ectopic  0   Multiple  0   Live Births               Home Medications    Prior to Admission medications   Medication Sig Start Date End Date Taking? Authorizing Provider  albuterol (PROAIR HFA) 108 (90 Base) MCG/ACT inhaler Inhale 2 puffs into the lungs every 6 (six) hours as needed for wheezing or shortness of breath.    [provider]  calcium carbonate (OS-CAL) 1250 (500 Ca) MG  chewable tablet Chew 1 tablet 2 (two) times daily by mouth.    [provider]  carisoprodol (SOMA) 350 MG tablet Take 350 mg 3 (three) times daily as needed by mouth for muscle spasms.  04/30/17   [provider]  cyclobenzaprine (FLEXERIL) 5 MG tablet Take 1 tablet (5 mg total) by mouth 3 (three) times daily as needed for muscle spasms. 06/14/17   Drenda Freeze, MD  cycloSPORINE (RESTASIS) 0.05 % ophthalmic emulsion Place 1 drop into both eyes 2 (two) times daily.    [provider]  diclofenac sodium (VOLTAREN) 1 % GEL Apply 4 g 4 (four) times daily as needed topically (for pain).    [provider]  dicyclomine (BENTYL) 20 MG tablet Take 1 tablet (20 mg total) by mouth 2 (two) times daily. 06/27/17   Tanna Furry, MD  diphenoxylate-atropine (LOMOTIL) 2.5-0.025 MG tablet Take 1 tablet by mouth 4 (four) times daily as needed for diarrhea or loose stools. Patient not taking: Reported on 08/10/2017 06/27/17   Tanna Furry, MD  fentaNYL (DURAGESIC - DOSED MCG/HR) 100 MCG/HR Place 100 mcg every 3 (three) days onto the skin.    [provider]  HYDROmorphone (DILAUDID) 8 MG tablet Take 8 mg every 6 (six) hours as needed by mouth for severe pain.     [provider]  hydrOXYzine (ATARAX/VISTARIL) 50 MG tablet Take 50 mg by mouth 3 (three) times daily as needed for itching.  01/14/16   [provider]  ibuprofen (ADVIL,MOTRIN) 800 MG tablet Take 800 mg every 8 (eight) hours as needed by mouth for headache or moderate pain.     [provider]  LATUDA 80 MG TABS tablet Take 1 tablet (80 mg total) by mouth daily. 08/10/17   Eksir, Richard Miu, MD  Multiple Vitamins-Iron (MULTIVITAMIN/IRON PO) Take 1 tablet 2 (two) times daily by mouth.    [provider]  NARCAN 4 MG/0.1ML LIQD nasal spray kit  05/30/17   [provider]  pantoprazole (PROTONIX) 40 MG tablet Take 1 tablet (40 mg total) by mouth 2 (two) times daily.  05/12/17   Alphonsa Overall, MD  Polyethyl Glycol-Propyl Glycol (SYSTANE) 0.4-0.3 % GEL ophthalmic gel Place 1 application as needed into both eyes (for dry eyes).    [provider]  pregabalin (LYRICA) 100 MG capsule Take 100 mg 3 (three) times daily by mouth.     [provider]  promethazine (PHENERGAN) 25 MG tablet Take 1 tablet (25 mg total) by mouth every 6 (six) hours as needed for nausea or vomiting. 04/14/16   Long, Wonda Olds, MD  SPIRIVA HANDIHALER 18 MCG inhalation capsule Place 1 puff daily as needed into inhaler  and inhale (for shortness of breath or wheezing).  04/08/16   [provider]  SUMAtriptan 6 MG/0.5ML SOAJ Inject 6 mg daily as needed into the muscle (for migraines).  06/09/15   [provider]  tiZANidine (ZANAFLEX) 4 MG tablet Take 4 mg 2 (two) times daily as needed by mouth for muscle spasms.    [provider]    Family History Family History  Problem Relation Age of Onset  . Heart attack Father        68s  . Diabetes Mother   . Heart disease Mother   . Hypertension Mother   . Heart attack Sister        24  . COPD Other   . Breast cancer Neg Hx     Social History Social History   Tobacco Use  . Smoking status: Current Every Day Smoker    Packs/day: 0.50    Types: Cigarettes  . Smokeless tobacco: Never Used  Substance Use Topics  . Alcohol use: Yes  . Drug use: No     Allergies   Morphine and related and Aspirin   Review of Systems Review of Systems  All other systems reviewed and are negative.    Physical Exam Updated Vital Signs BP (!) 151/80 (BP Location: Right Arm)   Pulse 97   Temp 98.2 F (36.8 C) (Oral)   Resp 18   LMP 01/08/2011   SpO2 94%   Physical Exam  Constitutional: She is oriented to person, place, and time. She appears well-developed and well-nourished.  Non-toxic appearance. No distress.  HENT:  Head: Normocephalic and atraumatic.  Eyes: Pupils are equal, round, and  reactive to light. Conjunctivae, EOM and lids are normal.  Neck: Normal range of motion. Neck supple. No tracheal deviation present. No thyroid mass present.  Cardiovascular: Normal rate, regular rhythm and normal heart sounds. Exam reveals no gallop.  No murmur heard. Pulmonary/Chest: Effort normal and breath sounds normal. No stridor. No respiratory distress. She has no decreased breath sounds. She has no wheezes. She has no rhonchi. She has no rales.  Abdominal: Soft. Normal appearance and bowel sounds are normal. She exhibits no distension. There is no tenderness. There is no rebound and no CVA tenderness.  Musculoskeletal: Normal range of motion. She exhibits no edema or tenderness.  Neurological: She is alert and oriented to person, place, and time. She has normal strength. No cranial nerve deficit or sensory deficit. GCS eye subscore is 4. GCS verbal subscore is 5. GCS motor subscore is 6.  Skin: Skin is warm and dry. No abrasion and no rash noted.  Psychiatric: She has a normal mood and affect. Her speech is normal and behavior is normal.  Nursing note and vitals reviewed.    ED Treatments / Results  Labs (all labs ordered are listed, but only abnormal results are displayed) Labs Reviewed - No data to display  EKG None  Radiology No results found.  Procedures Procedures (including critical care time)  Medications Ordered in ED Medications  metoCLOPramide (REGLAN) injection 10 mg (has no administration in time range)  diphenhydrAMINE (BENADRYL) injection 25 mg (has no administration in time range)  LORazepam (ATIVAN) injection 1 mg (has no administration in time range)     Initial Impression / Assessment and Plan / ED Course  I have reviewed the triage vital signs and the nursing notes.  Pertinent labs & imaging results that were available during my care of the patient were reviewed by me and  considered in my medical decision making (see chart for details).      Patient treated with IV fluids and Reglan along with Benadryl and Ativan and feels better.  States her headache is greatly improved.  No red flags for subarachnoid hemorrhage.  Repeat neurological exam at time of discharge stable.  Return precautions given.  Final Clinical Impressions(s) / ED Diagnoses   Final diagnoses:  None    ED Discharge Orders    None       Lacretia Leigh, MD 09/12/17 2200

## 2017-09-13 ENCOUNTER — Encounter (HOSPITAL_COMMUNITY): Payer: Self-pay | Admitting: Emergency Medicine

## 2017-09-13 ENCOUNTER — Other Ambulatory Visit: Payer: Self-pay

## 2017-09-13 ENCOUNTER — Emergency Department (HOSPITAL_COMMUNITY)
Admission: EM | Admit: 2017-09-13 | Discharge: 2017-09-13 | Disposition: A | Discharge status: Home / Self Care | Payer: Medicare Other | Source: Home / Self Care

## 2017-09-13 ENCOUNTER — Encounter (HOSPITAL_COMMUNITY): Payer: Self-pay

## 2017-09-13 ENCOUNTER — Emergency Department (HOSPITAL_COMMUNITY)
Admission: EM | Admit: 2017-09-13 | Discharge: 2017-09-13 | Disposition: A | Discharge status: Home / Self Care | Payer: Medicare Other | Attending: Emergency Medicine | Admitting: Emergency Medicine

## 2017-09-13 DIAGNOSIS — F419 Anxiety disorder, unspecified: Secondary | ICD-10-CM | POA: Insufficient documentation

## 2017-09-13 DIAGNOSIS — R51 Headache: Secondary | ICD-10-CM | POA: Insufficient documentation

## 2017-09-13 DIAGNOSIS — J449 Chronic obstructive pulmonary disease, unspecified: Secondary | ICD-10-CM | POA: Diagnosis not present

## 2017-09-13 DIAGNOSIS — G4489 Other headache syndrome: Secondary | ICD-10-CM

## 2017-09-13 DIAGNOSIS — Z5321 Procedure and treatment not carried out due to patient leaving prior to being seen by health care provider: Secondary | ICD-10-CM | POA: Insufficient documentation

## 2017-09-13 DIAGNOSIS — E119 Type 2 diabetes mellitus without complications: Secondary | ICD-10-CM | POA: Diagnosis not present

## 2017-09-13 DIAGNOSIS — R1110 Vomiting, unspecified: Secondary | ICD-10-CM

## 2017-09-13 DIAGNOSIS — E039 Hypothyroidism, unspecified: Secondary | ICD-10-CM | POA: Diagnosis not present

## 2017-09-13 DIAGNOSIS — I10 Essential (primary) hypertension: Secondary | ICD-10-CM | POA: Insufficient documentation

## 2017-09-13 DIAGNOSIS — F17210 Nicotine dependence, cigarettes, uncomplicated: Secondary | ICD-10-CM | POA: Insufficient documentation

## 2017-09-13 DIAGNOSIS — Z79899 Other long term (current) drug therapy: Secondary | ICD-10-CM | POA: Insufficient documentation

## 2017-09-13 MED ORDER — DIPHENHYDRAMINE HCL 50 MG/ML IJ SOLN
25.0000 mg | Freq: Once | INTRAMUSCULAR | Status: AC
  Administered 2017-09-13: 25 mg via INTRAVENOUS
  Filled 2017-09-13: qty 1

## 2017-09-13 MED ORDER — PROCHLORPERAZINE EDISYLATE 5 MG/ML IJ SOLN
10.0000 mg | Freq: Once | INTRAMUSCULAR | Status: AC
  Administered 2017-09-13: 10 mg via INTRAVENOUS
  Filled 2017-09-13: qty 2

## 2017-09-13 MED ORDER — KETOROLAC TROMETHAMINE 30 MG/ML IJ SOLN
30.0000 mg | Freq: Once | INTRAMUSCULAR | Status: AC
  Administered 2017-09-13: 30 mg via INTRAVENOUS
  Filled 2017-09-13: qty 1

## 2017-09-13 MED ORDER — SODIUM CHLORIDE 0.9 % IV BOLUS (SEPSIS)
1000.0000 mL | Freq: Once | INTRAVENOUS | Status: AC
  Administered 2017-09-13: 1000 mL via INTRAVENOUS

## 2017-09-13 NOTE — Discharge Instructions (Addendum)
Get plenty of rest and drink a lot of fluids.

## 2017-09-13 NOTE — ED Triage Notes (Signed)
Pt presents today with a headache x 2 days. Pt reports light sensitivity. Seen yesterday for same issue. Pt reports taking ibuprofen and Imitrex today with no relief. Pt report hx of migraines.

## 2017-09-13 NOTE — ED Triage Notes (Signed)
Pt. Stated, I still have a migrsine and throwing up. I went to [facility] yesterday for the same gave me shot

## 2017-09-13 NOTE — ED Provider Notes (Signed)
[facility] DEPT Provider Note   CSN: [id number] Arrival date & time: 09/13/17  1041     History   Chief Complaint Chief Complaint  Patient presents with  . Headache    HPI [name] is a 52 y.o. female.  She complains of recurrent headache which started at 2 AM this morning and was associated with multiple episodes of vomiting.  Similar headache yesterday, seen here and treated with a migraine cocktail.  She had relief of her headache pattern.  She has recurrent migraine headaches, every couple months, managed and followed by her PCP.  She took sumatriptan injection, yesterday and today, both times it did not help her headache.  She denies associated fever, chills, cough, shortness of breath, chest pain, weakness or dizziness.  There are no other known modifying factors.  HPI  Past Medical History:  Diagnosis Date  . Abdominal pain   . Accidental drug overdose April 2013  . Anxiety   . Atrial fibrillation ([place]) 09/29/11   converted spontaneously  . Chronic back pain   . Chronic knee pain   . Chronic nausea   . Chronic pain   . COPD (chronic obstructive pulmonary disease) ([place])   . Depression   . Diabetes mellitus    states her doctor took her off all DM meds in past month  . Diabetic neuropathy ([place])   . Dyspnea    with exertion   . GERD (gastroesophageal reflux disease)   . Headache(784.0)    migraines   . HTN (hypertension)    not on meds since in a year   . Hyperlipidemia   . Hypothyroidism    not on meds in a while   . Mental disorder    Bipolar and schizophrenic  . Requires supplemental oxygen    as needed per patient   . Schizophrenia ([place])   . Tobacco abuse     Patient Active Problem List   Diagnosis Date Noted  . 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 Abdominal pain 04/18/2016  . Dehydration 04/17/2016  . Morbid obesity ([place]) 03/25/2016  . Type 2 diabetes mellitus with  diabetic polyneuropathy, with long-term current use of insulin ([place]) 09/06/2015  . Vaginitis and vulvovaginitis, unspecified 04/19/2013  . BV (bacterial vaginosis) 04/19/2013  . HTN (hypertension) 11/04/2011  . Hypokalemia 10/02/2011  . atrial fibrillation 09/30/2011  . Acute respiratory failure ([place]) 09/29/2011  . COPD (chronic obstructive pulmonary disease) ([place]) 09/29/2011  . Accidental drug overdose 09/29/2011    Past Surgical History:  Procedure Laterality Date  . ABDOMINAL HYSTERECTOMY    . BLADDER SUSPENSION  03/04/2011   Procedure: [facility] PROCEDURE;  Surgeon: Elayne Snare MacDiarmid;  Location: [facility];  Service: Urology;  Laterality: N/A;  . CYSTOCELE REPAIR  03/04/2011   Procedure: ANTERIOR REPAIR (CYSTOCELE);  Surgeon: Reece Packer;  Location: [place] [facility];  Service: Urology;  Laterality: N/A;  . CYSTOSCOPY  03/04/2011   Procedure: CYSTOSCOPY;  Surgeon: Elayne Snare MacDiarmid;  Location: [facility];  Service: Urology;  Laterality: N/A;  . ESOPHAGOGASTRODUODENOSCOPY (EGD) WITH PROPOFOL N/A 05/12/2017   Procedure: ESOPHAGOGASTRODUODENOSCOPY (EGD) WITH PROPOFOL;  Surgeon: Alphonsa Overall, MD;  Location: [facility];  Service: General;  Laterality: N/A;  . GASTRIC ROUX-EN-Y N/A 03/25/2016   Procedure: LAPAROSCOPIC ROUX-EN-Y GASTRIC BYPASS WITH UPPER ENDOSCOPY;  Surgeon: Excell Seltzer, MD;  Location: [facility];  Service: General;  Laterality: N/A;  . KNEE SURGERY    . LAPAROSCOPIC ASSISTED VAGINAL HYSTERECTOMY  03/04/2011   Procedure: LAPAROSCOPIC ASSISTED VAGINAL HYSTERECTOMY;  Surgeon: Cyril Mourning, MD;  Location: [facility];  Service: Gynecology;  Laterality: N/A;     OB History    Gravida  3   Para  3   Term      Preterm      AB  0   Living  3     SAB  0   TAB  0   Ectopic  0   Multiple  0   Live Births               Home Medications    Prior to Admission medications   Medication Sig Start Date End Date Taking? Authorizing Provider  albuterol (PROAIR HFA) 108 (90  Base) MCG/ACT inhaler Inhale 2 puffs into the lungs every 6 (six) hours as needed for wheezing or shortness of breath.   Yes [provider]  ALPRAZolam Duanne Moron) 0.5 MG tablet Take 0.5-1 mg by mouth 2 (two) times daily. Take 1 tablet (0.5 mg) in the morning and Take 2 tablets (1 mg) in the afternoon. Do not take at bedtime   Yes [provider]  calcium carbonate (OS-CAL) 1250 (500 Ca) MG chewable tablet Chew 1 tablet 2 (two) times daily by mouth.   Yes [provider]  carisoprodol (SOMA) 350 MG tablet Take 350 mg 3 (three) times daily as needed by mouth for muscle spasms.  04/30/17  Yes [provider]  fentaNYL (DURAGESIC - DOSED MCG/HR) 100 MCG/HR Place 100 mcg every 3 (three) days onto the skin.   Yes [provider]  fluPHENAZine (PROLIXIN) 5 MG tablet Take 5 mg by mouth daily.   Yes [provider]  fluticasone (FLONASE) 50 MCG/ACT nasal spray Place 1 spray into both nostrils daily as needed for allergies.  08/11/17  Yes [provider]  HYDROmorphone (DILAUDID) 8 MG tablet Take 8 mg every 6 (six) hours as needed by mouth for severe pain.    Yes [provider]  hydrOXYzine (ATARAX/VISTARIL) 50 MG tablet Take 50 mg by mouth 3 (three) times daily as needed for itching.  01/14/16  Yes [provider]  ibuprofen (ADVIL,MOTRIN) 200 MG tablet Take 400 mg by mouth every 6 (six) hours as needed for moderate pain.   Yes [provider]  ibuprofen (ADVIL,MOTRIN) 800 MG tablet Take 800 mg every 8 (eight) hours as needed by mouth for headache or moderate pain.    Yes [provider]  lamoTRIgine (LAMICTAL) 25 MG tablet Take 25 mg by mouth 2 (two) times daily.   Yes [provider]  Multiple Vitamins-Iron (MULTIVITAMIN/IRON PO) Take 1 tablet 2 (two) times daily by mouth.   Yes [provider]  pregabalin (LYRICA) 100 MG capsule Take 100 mg 3 (three) times daily by mouth.    Yes [provider]  promethazine (PHENERGAN) 25 MG tablet Take 1 tablet (25 mg total) by mouth every 6 (six) hours as needed for nausea or vomiting. 04/14/16  Yes Long, Wonda Olds, MD  SPIRIVA HANDIHALER 18 MCG inhalation capsule Place 1 puff daily as needed into inhaler and inhale (for shortness of breath or wheezing).  04/08/16  Yes [provider]  SYMBICORT 160-4.5 MCG/ACT inhaler Inhale 2 puffs into the lungs daily as needed (sob and wheezing).  08/11/17  Yes [provider]  cyclobenzaprine (FLEXERIL) 5 MG tablet Take 1 tablet (5 mg total) by mouth 3 (three) times daily as needed for muscle spasms. Patient  not taking: Reported on 09/13/2017 06/14/17   Drenda Freeze, MD  diclofenac sodium (VOLTAREN) 1 % GEL Apply 4 g 4 (four) times daily as needed topically (for pain).    [provider]  dicyclomine (BENTYL) 20 MG tablet Take 1 tablet (20 mg total) by mouth 2 (two) times daily. Patient not taking: Reported on 09/13/2017 06/27/17   Tanna Furry, MD  diphenoxylate-atropine (LOMOTIL) 2.5-0.025 MG tablet Take 1 tablet by mouth 4 (four) times daily as needed for diarrhea or loose stools. Patient not taking: Reported on 08/10/2017 06/27/17   Tanna Furry, MD  LATUDA 80 MG TABS tablet Take 1 tablet (80 mg total) by mouth daily. Patient not taking: Reported on 09/13/2017 08/10/17   Aundra Dubin, MD  [facility] 4 MG/0.1ML LIQD nasal spray kit  05/30/17   [provider]  pantoprazole (PROTONIX) 40 MG tablet Take 1 tablet (40 mg total) by mouth 2 (two) times daily. Patient taking differently: Take 40 mg by mouth 2 (two) times daily as needed (indigestion).  05/12/17   Alphonsa Overall, MD  Polyethyl Glycol-Propyl Glycol (SYSTANE) 0.4-0.3 % GEL ophthalmic gel Place 1 application as needed into both eyes (for dry eyes).    [provider]  simvastatin (ZOCOR) 20 MG tablet Take 20 mg by mouth daily.  08/11/17   [provider]  SUMAtriptan 6 MG/0.5ML SOAJ Inject 6  mg daily as needed into the muscle (for migraines).  06/09/15   [provider]    Family History Family History  Problem Relation Age of Onset  . Heart attack Father        83s  . Diabetes Mother   . Heart disease Mother   . Hypertension Mother   . Heart attack Sister        6  . COPD Other   . Breast cancer Neg Hx     Social History Social History   Tobacco Use  . Smoking status: Current Every Day Smoker    Packs/day: 0.50    Types: Cigarettes  . Smokeless tobacco: Never Used  Substance Use Topics  . Alcohol use: Yes  . Drug use: No     Allergies   Morphine and related and Aspirin   Review of Systems Review of Systems  All other systems reviewed and are negative.    Physical Exam Updated Vital Signs BP 125/85 (BP Location: Left Arm)   Pulse 80   Temp 97.9 F (36.6 C) (Oral)   Resp 20   LMP 01/08/2011   SpO2 100%   Physical Exam  Constitutional: She is oriented to person, place, and time. She appears well-developed and well-nourished.  HENT:  Head: Normocephalic and atraumatic.  Eyes: Pupils are equal, round, and reactive to light. Conjunctivae and EOM are normal.  Neck: Normal range of motion and phonation normal. Neck supple. No neck rigidity. No tracheal deviation present.  Cardiovascular: Normal rate and regular rhythm.  Pulmonary/Chest: Effort normal and breath sounds normal. She exhibits no tenderness.  Abdominal: Soft. She exhibits no distension. There is no tenderness. There is no guarding.  Musculoskeletal: Normal range of motion.  Neurological: She is alert and oriented to person, place, and time. She exhibits normal muscle tone.  No dysarthria, aphasia or nystagmus.  Skin: Skin is warm and dry.  Psychiatric: She has a normal mood and affect. Her behavior is normal. Judgment and thought content normal.  Nursing note and vitals reviewed.    ED Treatments / Results  Labs (all labs  ordered are listed, but only abnormal results  are displayed) Labs Reviewed - No data to display  EKG None  Radiology No results found.  Procedures Procedures (including critical care time)  Medications Ordered in ED Medications  sodium chloride 0.9 % bolus 1,000 mL (0 mLs Intravenous Stopped 09/13/17 1627)  prochlorperazine (COMPAZINE) injection 10 mg (10 mg Intravenous Given 09/13/17 1505)  diphenhydrAMINE (BENADRYL) injection 25 mg (25 mg Intravenous Given 09/13/17 1505)  ketorolac (TORADOL) 30 MG/ML injection 30 mg (30 mg Intravenous Given 09/13/17 1505)     Initial Impression / Assessment and Plan / ED Course  I have reviewed the triage vital signs and the nursing notes.  Pertinent labs & imaging results that were available during my care of the patient were reviewed by me and considered in my medical decision making (see chart for details).      No data found.  At D/C Reevaluation with update and discussion. After initial assessment and treatment, an updated evaluation reveals patient states she is feeling much better after the medication cocktail was given.  Findings discussed with the patient and all questions were answered. Daleen Bo   Nursing Notes Reviewed/ Care Coordinated Applicable Imaging Reviewed Interpretation of Laboratory Data incorporated into ED treatment  The patient appears reasonably screened and/or stabilized for discharge and I doubt any other medical condition or other [facility] requiring further screening, evaluation, or treatment in the ED at this time prior to discharge.  Plan: Home Medications-continue routine home medications; Home Treatments-rest, fluids; return here if the recommended treatment, does not improve the symptoms; Recommended follow up-PCP follow-up as needed    Final Clinical Impressions(s) / ED Diagnoses   Final diagnoses:  Other headache syndrome    ED Discharge Orders    None       Daleen Bo, MD 09/15/17 1020

## 2017-10-06 ENCOUNTER — Emergency Department (HOSPITAL_COMMUNITY)
Admission: EM | Admit: 2017-10-06 | Discharge: 2017-10-06 | Discharge status: Against Medical Advice | Payer: Medicare Other | Attending: Emergency Medicine | Admitting: Emergency Medicine

## 2017-10-06 ENCOUNTER — Other Ambulatory Visit: Payer: Self-pay

## 2017-10-06 ENCOUNTER — Encounter (HOSPITAL_COMMUNITY): Payer: Self-pay | Admitting: Emergency Medicine

## 2017-10-06 DIAGNOSIS — Z5321 Procedure and treatment not carried out due to patient leaving prior to being seen by health care provider: Secondary | ICD-10-CM | POA: Insufficient documentation

## 2017-10-06 DIAGNOSIS — R220 Localized swelling, mass and lump, head: Secondary | ICD-10-CM | POA: Diagnosis present

## 2017-10-06 NOTE — ED Triage Notes (Signed)
Pt from home with complaints of a medication reaction to prolixin. Pt states her tongue was swollen but she currently does not feel symptoms. Pt states she felt like she felt this around 1300 yesterday

## 2017-11-12 ENCOUNTER — Encounter (HOSPITAL_COMMUNITY): Payer: Self-pay

## 2017-11-12 ENCOUNTER — Other Ambulatory Visit: Payer: Self-pay

## 2017-11-12 ENCOUNTER — Inpatient Hospital Stay (HOSPITAL_COMMUNITY)
Admission: AD | Admit: 2017-11-12 | Discharge: 2017-11-16 | DRG: 885 | Disposition: A | Discharge status: Home / Self Care | Payer: Medicare Other | Source: Intra-hospital | Attending: Psychiatry | Admitting: Psychiatry

## 2017-11-12 ENCOUNTER — Encounter (HOSPITAL_COMMUNITY): Payer: Self-pay | Admitting: Emergency Medicine

## 2017-11-12 ENCOUNTER — Emergency Department (HOSPITAL_COMMUNITY)
Admission: EM | Admit: 2017-11-12 | Discharge: 2017-11-12 | Disposition: A | Discharge status: Other Acute Inpatient Hospital | Payer: Medicare Other | Attending: Emergency Medicine | Admitting: Emergency Medicine

## 2017-11-12 DIAGNOSIS — E039 Hypothyroidism, unspecified: Secondary | ICD-10-CM | POA: Diagnosis present

## 2017-11-12 DIAGNOSIS — Z885 Allergy status to narcotic agent status: Secondary | ICD-10-CM | POA: Diagnosis not present

## 2017-11-12 DIAGNOSIS — Z7951 Long term (current) use of inhaled steroids: Secondary | ICD-10-CM

## 2017-11-12 DIAGNOSIS — Z8249 Family history of ischemic heart disease and other diseases of the circulatory system: Secondary | ICD-10-CM | POA: Diagnosis not present

## 2017-11-12 DIAGNOSIS — E119 Type 2 diabetes mellitus without complications: Secondary | ICD-10-CM | POA: Diagnosis not present

## 2017-11-12 DIAGNOSIS — F17210 Nicotine dependence, cigarettes, uncomplicated: Secondary | ICD-10-CM | POA: Diagnosis present

## 2017-11-12 DIAGNOSIS — J449 Chronic obstructive pulmonary disease, unspecified: Secondary | ICD-10-CM | POA: Diagnosis present

## 2017-11-12 DIAGNOSIS — M1990 Unspecified osteoarthritis, unspecified site: Secondary | ICD-10-CM | POA: Diagnosis present

## 2017-11-12 DIAGNOSIS — F259 Schizoaffective disorder, unspecified: Secondary | ICD-10-CM | POA: Diagnosis present

## 2017-11-12 DIAGNOSIS — I4891 Unspecified atrial fibrillation: Secondary | ICD-10-CM | POA: Diagnosis present

## 2017-11-12 DIAGNOSIS — F250 Schizoaffective disorder, bipolar type: Secondary | ICD-10-CM | POA: Diagnosis present

## 2017-11-12 DIAGNOSIS — F419 Anxiety disorder, unspecified: Secondary | ICD-10-CM | POA: Diagnosis present

## 2017-11-12 DIAGNOSIS — I10 Essential (primary) hypertension: Secondary | ICD-10-CM | POA: Diagnosis present

## 2017-11-12 DIAGNOSIS — K219 Gastro-esophageal reflux disease without esophagitis: Secondary | ICD-10-CM | POA: Diagnosis present

## 2017-11-12 DIAGNOSIS — Z886 Allergy status to analgesic agent status: Secondary | ICD-10-CM

## 2017-11-12 DIAGNOSIS — Z79899 Other long term (current) drug therapy: Secondary | ICD-10-CM | POA: Diagnosis not present

## 2017-11-12 DIAGNOSIS — M549 Dorsalgia, unspecified: Secondary | ICD-10-CM | POA: Diagnosis present

## 2017-11-12 DIAGNOSIS — Z915 Personal history of self-harm: Secondary | ICD-10-CM | POA: Diagnosis not present

## 2017-11-12 DIAGNOSIS — Z630 Problems in relationship with spouse or partner: Secondary | ICD-10-CM | POA: Diagnosis not present

## 2017-11-12 DIAGNOSIS — E7800 Pure hypercholesterolemia, unspecified: Secondary | ICD-10-CM | POA: Diagnosis present

## 2017-11-12 DIAGNOSIS — G4700 Insomnia, unspecified: Secondary | ICD-10-CM | POA: Diagnosis present

## 2017-11-12 DIAGNOSIS — Z79891 Long term (current) use of opiate analgesic: Secondary | ICD-10-CM | POA: Diagnosis not present

## 2017-11-12 DIAGNOSIS — E1142 Type 2 diabetes mellitus with diabetic polyneuropathy: Secondary | ICD-10-CM | POA: Diagnosis present

## 2017-11-12 DIAGNOSIS — F209 Schizophrenia, unspecified: Secondary | ICD-10-CM | POA: Insufficient documentation

## 2017-11-12 DIAGNOSIS — G8929 Other chronic pain: Secondary | ICD-10-CM | POA: Diagnosis present

## 2017-11-12 DIAGNOSIS — E785 Hyperlipidemia, unspecified: Secondary | ICD-10-CM | POA: Diagnosis present

## 2017-11-12 DIAGNOSIS — F1910 Other psychoactive substance abuse, uncomplicated: Secondary | ICD-10-CM | POA: Diagnosis not present

## 2017-11-12 DIAGNOSIS — Z833 Family history of diabetes mellitus: Secondary | ICD-10-CM | POA: Diagnosis not present

## 2017-11-12 DIAGNOSIS — R45851 Suicidal ideations: Secondary | ICD-10-CM

## 2017-11-12 DIAGNOSIS — R825 Elevated urine levels of drugs, medicaments and biological substances: Secondary | ICD-10-CM | POA: Diagnosis not present

## 2017-11-12 DIAGNOSIS — Z62810 Personal history of physical and sexual abuse in childhood: Secondary | ICD-10-CM | POA: Diagnosis not present

## 2017-11-12 DIAGNOSIS — Z90710 Acquired absence of both cervix and uterus: Secondary | ICD-10-CM

## 2017-11-12 DIAGNOSIS — F1990 Other psychoactive substance use, unspecified, uncomplicated: Secondary | ICD-10-CM | POA: Diagnosis not present

## 2017-11-12 HISTORY — DX: Brief psychotic disorder: F23

## 2017-11-12 LAB — CBC WITH DIFFERENTIAL/PLATELET
Basophils Absolute: 0 10*3/uL (ref 0.0–0.1)
Basophils Relative: 0 %
EOS ABS: 0.1 10*3/uL (ref 0.0–0.7)
Eosinophils Relative: 1 %
HEMATOCRIT: 39.3 % (ref 36.0–46.0)
HEMOGLOBIN: 13.1 g/dL (ref 12.0–15.0)
LYMPHS ABS: 2.1 10*3/uL (ref 0.7–4.0)
Lymphocytes Relative: 33 %
MCH: 28.7 pg (ref 26.0–34.0)
MCHC: 33.3 g/dL (ref 30.0–36.0)
MCV: 86 fL (ref 78.0–100.0)
MONOS PCT: 3 %
Monocytes Absolute: 0.2 10*3/uL (ref 0.1–1.0)
NEUTROS ABS: 4 10*3/uL (ref 1.7–7.7)
Neutrophils Relative %: 63 %
Platelets: 254 10*3/uL (ref 150–400)
RBC: 4.57 MIL/uL (ref 3.87–5.11)
RDW: 12.9 % (ref 11.5–15.5)
WBC: 6.4 10*3/uL (ref 4.0–10.5)

## 2017-11-12 LAB — RAPID URINE DRUG SCREEN, HOSP PERFORMED
Amphetamines: NOT DETECTED
BENZODIAZEPINES: POSITIVE — AB
Barbiturates: POSITIVE — AB
Cocaine: POSITIVE — AB
Opiates: POSITIVE — AB
TETRAHYDROCANNABINOL: POSITIVE — AB

## 2017-11-12 LAB — COMPREHENSIVE METABOLIC PANEL
ALBUMIN: 4.2 g/dL (ref 3.5–5.0)
ALT: 17 U/L (ref 14–54)
ANION GAP: 11 (ref 5–15)
AST: 17 U/L (ref 15–41)
Alkaline Phosphatase: 83 U/L (ref 38–126)
BUN: 12 mg/dL (ref 6–20)
CHLORIDE: 103 mmol/L (ref 101–111)
CO2: 27 mmol/L (ref 22–32)
Calcium: 9.3 mg/dL (ref 8.9–10.3)
Creatinine, Ser: 0.68 mg/dL (ref 0.44–1.00)
GFR calc Af Amer: >60 mL/min (ref >60)
GFR calc non Af Amer: >60 mL/min (ref >60)
GLUCOSE: 117 mg/dL — AB (ref 65–99)
POTASSIUM: 3.9 mmol/L (ref 3.5–5.1)
SODIUM: 141 mmol/L (ref 135–145)
Total Bilirubin: 0.6 mg/dL (ref 0.3–1.2)
Total Protein: 7.5 g/dL (ref 6.5–8.1)

## 2017-11-12 LAB — URINALYSIS, ROUTINE W REFLEX MICROSCOPIC
BILIRUBIN URINE: NEGATIVE
Glucose, UA: NEGATIVE mg/dL
HGB URINE DIPSTICK: NEGATIVE
KETONES UR: NEGATIVE mg/dL
Leukocytes, UA: NEGATIVE
NITRITE: NEGATIVE
PH: 7 (ref 5.0–8.0)
Protein, ur: NEGATIVE mg/dL
SPECIFIC GRAVITY, URINE: 1.009 (ref 1.005–1.030)

## 2017-11-12 LAB — ETHANOL: Alcohol, Ethyl (B): <10 mg/dL (ref <10)

## 2017-11-12 LAB — SALICYLATE LEVEL

## 2017-11-12 LAB — ACETAMINOPHEN LEVEL: Acetaminophen (Tylenol), Serum: <10 ug/mL — ABNORMAL LOW (ref 10–30)

## 2017-11-12 MED ORDER — LAMOTRIGINE 25 MG PO TABS
25.0000 mg | ORAL_TABLET | Freq: Two times a day (BID) | ORAL | Status: DC
  Administered 2017-11-12: 25 mg via ORAL
  Filled 2017-11-12: qty 1

## 2017-11-12 MED ORDER — PREGABALIN 50 MG PO CAPS: 100.0000 mg | ORAL_CAPSULE | Freq: Three times a day (TID) | ORAL | Status: DC

## 2017-11-12 MED ORDER — TIOTROPIUM BROMIDE MONOHYDRATE 18 MCG IN CAPS
18.0000 ug | ORAL_CAPSULE | Freq: Every day | RESPIRATORY_TRACT | Status: DC | PRN
  Filled 2017-11-12: qty 5

## 2017-11-12 MED ORDER — FENTANYL 100 MCG/HR TD PT72: 100.0000 ug | MEDICATED_PATCH | TRANSDERMAL | Status: DC

## 2017-11-12 MED ORDER — FLUPHENAZINE HCL 5 MG PO TABS
5.0000 mg | ORAL_TABLET | Freq: Every day | ORAL | Status: DC
  Administered 2017-11-12: 5 mg via ORAL
  Filled 2017-11-12: qty 1

## 2017-11-12 MED ORDER — OLANZAPINE 10 MG IM SOLR
10.0000 mg | Freq: Once | INTRAMUSCULAR | Status: AC
  Administered 2017-11-12: 10 mg via INTRAMUSCULAR
  Filled 2017-11-12: qty 10

## 2017-11-12 MED ORDER — FLUTICASONE PROPIONATE 50 MCG/ACT NA SUSP
1.0000 | Freq: Every day | NASAL | Status: DC | PRN
  Filled 2017-11-12: qty 16

## 2017-11-12 MED ORDER — ALUM & MAG HYDROXIDE-SIMETH 200-200-20 MG/5ML PO SUSP: 30.0000 mL | Freq: Four times a day (QID) | ORAL | Status: DC | PRN

## 2017-11-12 MED ORDER — ACETAMINOPHEN 325 MG PO TABS: 650.0000 mg | ORAL_TABLET | ORAL | Status: DC | PRN

## 2017-11-12 MED ORDER — ONDANSETRON HCL 4 MG PO TABS: 4.0000 mg | ORAL_TABLET | Freq: Three times a day (TID) | ORAL | Status: DC | PRN

## 2017-11-12 MED ORDER — STERILE WATER FOR INJECTION IJ SOLN
INTRAMUSCULAR | Status: AC
  Administered 2017-11-12: 18:00:00
  Filled 2017-11-12: qty 10

## 2017-11-12 MED ORDER — NICOTINE 21 MG/24HR TD PT24
21.0000 mg | MEDICATED_PATCH | Freq: Every day | TRANSDERMAL | Status: DC
  Administered 2017-11-12: 21 mg via TRANSDERMAL
  Filled 2017-11-12: qty 1

## 2017-11-12 MED ORDER — IBUPROFEN 800 MG PO TABS: 800.0000 mg | ORAL_TABLET | Freq: Four times a day (QID) | ORAL | Status: DC | PRN

## 2017-11-12 MED ORDER — ALBUTEROL SULFATE HFA 108 (90 BASE) MCG/ACT IN AERS: 2.0000 | INHALATION_SPRAY | Freq: Four times a day (QID) | RESPIRATORY_TRACT | Status: DC | PRN

## 2017-11-12 MED ORDER — DICLOFENAC SODIUM 1 % TD GEL
4.0000 g | Freq: Four times a day (QID) | TRANSDERMAL | Status: DC | PRN
  Filled 2017-11-12: qty 100

## 2017-11-12 MED ORDER — SIMVASTATIN 20 MG PO TABS
20.0000 mg | ORAL_TABLET | Freq: Every day | ORAL | Status: DC
  Administered 2017-11-12: 20 mg via ORAL
  Filled 2017-11-12: qty 1

## 2017-11-12 MED ORDER — DIPHENHYDRAMINE HCL 25 MG PO CAPS
50.0000 mg | ORAL_CAPSULE | Freq: Once | ORAL | Status: DC
  Filled 2017-11-12: qty 2

## 2017-11-12 MED ORDER — OLANZAPINE 10 MG PO TABS
10.0000 mg | ORAL_TABLET | Freq: Once | ORAL | Status: DC
  Filled 2017-11-12: qty 1

## 2017-11-12 MED ORDER — DIPHENHYDRAMINE HCL 50 MG/ML IJ SOLN
50.0000 mg | Freq: Once | INTRAMUSCULAR | Status: AC
  Administered 2017-11-12: 50 mg via INTRAMUSCULAR
  Filled 2017-11-12: qty 1

## 2017-11-12 MED ORDER — GABAPENTIN 300 MG PO CAPS
300.0000 mg | ORAL_CAPSULE | Freq: Four times a day (QID) | ORAL | Status: DC
  Administered 2017-11-12: 300 mg via ORAL
  Filled 2017-11-12: qty 1

## 2017-11-12 MED ORDER — TRAMADOL HCL 50 MG PO TABS
50.0000 mg | ORAL_TABLET | Freq: Four times a day (QID) | ORAL | Status: DC | PRN
  Administered 2017-11-12: 50 mg via ORAL
  Filled 2017-11-12: qty 1

## 2017-11-12 MED ORDER — PANTOPRAZOLE SODIUM 40 MG PO TBEC: 40.0000 mg | DELAYED_RELEASE_TABLET | Freq: Two times a day (BID) | ORAL | Status: DC | PRN

## 2017-11-12 MED ORDER — HYDROXYZINE HCL 25 MG PO TABS
50.0000 mg | ORAL_TABLET | Freq: Three times a day (TID) | ORAL | Status: DC | PRN
Start: 2017-11-12 — End: 2017-11-12

## 2017-11-12 NOTE — BH Assessment (Signed)
Tele Assessment Note   Patient Name: [name] MRN: [id number] Referring Physician: Margette Fast, MD Location of [facility]: [facility] Location of Provider: [place] is an 52 y.o. female present to the ER via EMS after her daughter and husband believed patient possibly overdose on Temazepam. Patient report she has been in pain and unable to sleep. Report she took several Temazepam in an attempt to 'just sleep and stop the pain.' Denies suicidal ideations or intent. Denies homicidal ideations and visual hallucination. Endorses auditory hallucinations of hearing her voice name 'Wille Glaser' speaking to her. Patient states she has named the voice she hears in her head. Patient admits to self-medicating, UDS's positive for Barbiturates, Benzo's, Opiates, Cocaine, and THC. Patient has history of COPD, DM, HTN and schizophrenia. Patient admits to being off her medication for a least 4 days tating, 'they do not work.'   Patient present confused and forgetful, not remembering what she just stated. Report she has not slept in 4 days and the longest she has been without sleeping is 7 days. Patient was able to communicate she was in the hospital, how she arrived, date, and her b-day. Patient believes she was admitted to the hospital via her husband so he could 'cheat' on her. Report 'everyone loves him.' Patient daughter present during assessment report her mother presentation earlier was something she had never seen before. Report her mother was speaking in tangential (not making an sense) and nodding off.     Diagnosis: F20.9  Schizophrenia   Past Medical History:  Past Medical History:  Diagnosis Date  . Abdominal pain   . Accidental drug overdose April 2013  . Anxiety   . Atrial fibrillation ([place]) 09/29/11   converted spontaneously  . Chronic back pain   . Chronic knee pain   . Chronic nausea   . Chronic pain   . COPD (chronic obstructive pulmonary disease) ([place])    . Depression   . Diabetes mellitus    states her doctor took her off all DM meds in past month  . Diabetic neuropathy ([place])   . Dyspnea    with exertion   . GERD (gastroesophageal reflux disease)   . Headache(784.0)    migraines   . HTN (hypertension)    not on meds since in a year   . Hyperlipidemia   . Hypothyroidism    not on meds in a while   . Mental disorder    Bipolar and schizophrenic  . Requires supplemental oxygen    as needed per patient   . Schizophrenia ([place])   . Tobacco abuse     Past Surgical History:  Procedure Laterality Date  . ABDOMINAL HYSTERECTOMY    . BLADDER SUSPENSION  03/04/2011   Procedure: [facility] PROCEDURE;  Surgeon: Elayne Snare MacDiarmid;  Location: [facility] [facility];  Service: Urology;  Laterality: N/A;  . CYSTOCELE REPAIR  03/04/2011   Procedure: ANTERIOR REPAIR (CYSTOCELE);  Surgeon: Reece Packer;  Location: [facility];  Service: Urology;  Laterality: N/A;  . CYSTOSCOPY  03/04/2011   Procedure: CYSTOSCOPY;  Surgeon: Elayne Snare MacDiarmid;  Location: [facility];  Service: Urology;  Laterality: N/A;  . ESOPHAGOGASTRODUODENOSCOPY (EGD) WITH PROPOFOL N/A 05/12/2017   Procedure: ESOPHAGOGASTRODUODENOSCOPY (EGD) WITH PROPOFOL;  Surgeon: Alphonsa Overall, MD;  Location: [facility];  Service: General;  Laterality: N/A;  . GASTRIC ROUX-EN-Y N/A 03/25/2016   Procedure: LAPAROSCOPIC ROUX-EN-Y GASTRIC BYPASS WITH UPPER ENDOSCOPY;  Surgeon: Excell Seltzer, MD;  Location[facility];  Service: General;  Laterality: N/A;  . KNEE SURGERY    . LAPAROSCOPIC ASSISTED VAGINAL HYSTERECTOMY  03/04/2011   Procedure: LAPAROSCOPIC ASSISTED VAGINAL HYSTERECTOMY;  Surgeon: Cyril Mourning, MD;  Location: [place] [facility];  Service: Gynecology;  Laterality: N/A;    Family History:  Family History  Problem Relation Age of Onset  . Heart attack Father        45s  . Diabetes Mother   . Heart disease Mother   . Hypertension Mother   . Heart attack Sister        60  . COPD Other   . Breast cancer Neg  Hx     Social History:  reports that she has been smoking cigarettes.  She has been smoking about 0.50 packs per day. She has never used smokeless tobacco. She reports that she drinks alcohol. She reports that she does not use drugs.  Additional Social History:  Alcohol / Drug Use Pain Medications: see MAR Prescriptions: see MAR Over the Counter: see MAR History of alcohol / drug use?: Yes Substance #1 Name of Substance 1: Barbituarates 1 - Age of First Use: unknown 1 - Amount (size/oz): unknown 1 - Frequency: unknown 1 - Duration: ongoing 1 - Last Use / Amount: recently UDS positive Substance #2 Name of Substance 2: Benzo's 2 - Age of First Use: unknown 2 - Amount (size/oz): unknown 2 - Frequency: unknown 2 - Duration: unknown 2 - Last Use / Amount: recently UDS positive Substance #3 Name of Substance 3: Opiates 3 - Age of First Use: unknown 3 - Amount (size/oz): unknown 3 - Frequency: unknown 3 - Duration: unknown 3 - Last Use / Amount: recently UDS positive Substance #4 Name of Substance 4: Cocaine 4 - Age of First Use: unknown 4 - Amount (size/oz): unknown 4 - Frequency: unknown 4 - Duration: unknown 4 - Last Use / Amount: recently UDS positive Substance #5 Name of Substance 5: THC 5 - Age of First Use: unknown 5 - Amount (size/oz): unknown 5 - Frequency: unknown 5 - Duration: unknown 5 - Last Use / Amount: recently UDS positive  CIWA: CIWA-Ar BP: (!) 137/95 Pulse Rate: 80 COWS:    Allergies:  Allergies  Allergen Reactions  . Morphine And Related Nausea And Vomiting and Other (See Comments)    Pt stated she is not allergic to medication  . Aspirin Nausea And Vomiting and Other (See Comments)    Ok to take tylenol or ibuprofen    Home Medications:  (Not in a hospital admission)  OB/GYN Status:  Patient's last menstrual period was 01/08/2011.  General Assessment Data [facility] of Assessment: [facility] TTS Assessment: In system Is this a Tele or  Face-to-Face Assessment?: Face-to-Face Is this an Initial Assessment or a Re-assessment for this encounter?: Initial Assessment Marital status: Married Living Arrangements: Alone, Spouse/significant other Can pt return to current living arrangement?: Yes Admission Status: Voluntary(pt being IVC'd by doctor ) Is patient capable of signing voluntary admission?: (patient is IVC'd) Referral Source: Self/Family/Friend Insurance type: [employer]     Crisis Care Plan Living Arrangements: Alone, Spouse/significant other Legal Guardian: Other:(self) Name of Psychiatrist: Daphne Name of Therapist: [facility]  Education Status Is patient currently in school?: No  Risk to self with the past 6 months Suicidal Ideation: No Has patient been a risk to self within the past 6 months prior to admission? : No Suicidal Intent: No Has patient had any suicidal intent within the past 6 months prior to admission? :  No Is patient at risk for suicide?: No Suicidal Plan?: No Has patient had any suicidal plan within the past 6 months prior to admission? : No Access to Means: No What has been your use of drugs/alcohol within the last 12 months?: Barbituarates, Benzo's, Opiates, Cocaine, THC Previous Attempts/Gestures: No How many times?: 0 Other Self Harm Risks: pt denies  Triggers for Past Attempts: None known Intentional Self Injurious Behavior: None Family Suicide History: No Recent stressful life event(s): Other (Comment)(patient not taking her medication ) Persecutory voices/beliefs?: No Depression: (pt denies ) Substance abuse history and/or treatment for substance abuse?: No Suicide prevention information given to non-admitted patients: Not applicable  Risk to Others within the past 6 months Homicidal Ideation: No Does patient have any lifetime risk of violence toward others beyond the six months prior to admission? : No Thoughts of Harm to Others: No Current Homicidal Intent:  No Current Homicidal Plan: No Access to Homicidal Means: No Identified Victim: n/a History of harm to others?: No Assessment of Violence: None Noted Violent Behavior Description: None Noted Does patient have access to weapons?: No Criminal Charges Pending?: No Does patient have a court date: No Is patient on probation?: No  Psychosis Hallucinations: None noted Delusions: None noted  Mental Status Report Appearance/Hygiene: In scrubs Eye Contact: Good Motor Activity: Freedom of movement Speech: Logical/coherent Level of Consciousness: Alert Mood: Anxious Affect: Anxious Anxiety Level: Minimal Thought Processes: Coherent, Relevant Judgement: Impaired Orientation: Person, Place, Time, Situation Obsessive Compulsive Thoughts/Behaviors: None  Cognitive Functioning Concentration: Good Memory: Recent Impaired, Remote Impaired Is patient IDD: No Is patient DD?: No Insight: Fair Impulse Control: Fair Appetite: Poor Have you had any weight changes? : No Change Sleep: Decreased Total Hours of Sleep: (pt report she has been up for 4 days) Vegetative Symptoms: None  ADLScreening [facility] Assessment Services) Patient's cognitive ability adequate to safely complete daily activities?: Yes Patient able to express need for assistance with ADLs?: Yes Independently performs ADLs?: Yes (appropriate for developmental age)  Prior Inpatient Therapy Prior Inpatient Therapy: Yes Prior Therapy Dates: 04/2017 Prior Therapy Facilty/Provider(s): [facility] Reason for Treatment: mental health   Prior Outpatient Therapy Prior Outpatient Therapy: No Does patient have an ACCT team?: Yes Does patient have Intensive In-House Services?  : No Does patient have [facility] services? : No Does patient have P4CC services?: No  ADL Screening (condition at time of admission) Patient's cognitive ability adequate to safely complete daily activities?: Yes Is the patient deaf or have difficulty hearing?: No Does  the patient have difficulty seeing, even when wearing glasses/contacts?: No Does the patient have difficulty concentrating, remembering, or making decisions?: No Patient able to express need for assistance with ADLs?: Yes Does the patient have difficulty dressing or bathing?: No Independently performs ADLs?: Yes (appropriate for developmental age) Does the patient have difficulty walking or climbing stairs?: No       Abuse/Neglect Assessment (Assessment to be complete while patient is alone) Abuse/Neglect Assessment Can Be Completed: Unable to assess, patient is non-responsive or altered mental status     Advance Directives (For Healthcare) Does Patient Have a Medical Advance Directive?: No Would patient like information on creating a medical advance directive?: No - Patient declined          Disposition:  Disposition Initial Assessment Completed for this Encounter: Yes(Jamison Lord, NP, recommend inpt tx )  This service was provided via telemedicine using a 2-way, interactive audio and video technology.  Names of all persons participating in this telemedicine service and  their role in this encounter. Name: Loma Sousa Books Role: Daughter  Name:  Role:   Name:  Role:   Name:  Role:     Despina Hidden 11/12/2017 5:40 PM

## 2017-11-12 NOTE — ED Notes (Signed)
One labeled patient belongings bag transferred with patient.

## 2017-11-12 NOTE — ED Notes (Signed)
Pharmacy is unable to reconcile patient medication until she wakes up.

## 2017-11-12 NOTE — ED Notes (Signed)
Attempted to wake patient up to talk to pharmacy unable to do so at this time.

## 2017-11-12 NOTE — ED Notes (Signed)
Bed: WA17 Expected date:  Expected time:  Means of arrival:  Comments: 52 yo lethargic, took sleeping pills

## 2017-11-12 NOTE — ED Notes (Signed)
Upon waking patient, patient states "if you do not get me something for pain, I am leaving." Patient attempting to get up. MD made aware.

## 2017-11-12 NOTE — ED Triage Notes (Signed)
Per EMS pt friend is tired post taking two 15 mg temazepam an hour ago to help her sleep. Ambulatory and a/o x4.

## 2017-11-12 NOTE — ED Notes (Signed)
Pt transported to [facility] by [facility] for continuation of specialized care. He left in no acute distress.

## 2017-11-12 NOTE — BHH Counselor (Signed)
Patient has been accepted to [facility]. Night AC will call when with bed assignment.

## 2017-11-12 NOTE — ED Notes (Signed)
TTS at bedside. 

## 2017-11-12 NOTE — ED Notes (Signed)
Caryl Pina RN made aware Dr. Laverta Baltimore reports patient is IVC'd.

## 2017-11-12 NOTE — ED Notes (Signed)
Pt admitted to room #34. Pt irritable, verbally aggressive, pacing. Speech pressured, tangential, loud. Pt disorganized. Pt reports she has been off medication. No insight in regards to behavior. Pt blaming others. Pt responding to internal stimuli.  Pt referencing imaginary friend "Wille Glaser" who pt reports has been around since she was 52 years old. Pt behavior not redirectable. Encouragement and support provided. Special checks q 15 mins in place for safety, Video monitoring in place. Will continue to monitor.

## 2017-11-12 NOTE — ED Notes (Signed)
Bed: [facility] Expected date:  Expected time:  Means of arrival:  Comments: 17

## 2017-11-12 NOTE — ED Notes (Signed)
Pt fell asleep while eating sandwich. Had to wake pt several times so pt could finish food. Pt is asking for more pain medicine but cannot keep eyes open long enough while asking or eating.

## 2017-11-12 NOTE — ED Provider Notes (Signed)
Emergency Department Provider Note   I have reviewed the triage vital signs and the nursing notes.   HISTORY  Chief Complaint Fatigue   HPI [name] is a 52 y.o. female with PMH of COPD, DM, HTN, tobacco abuse, and schizophrenia presents to the emergency department by EMS with concern for possible overdose on Temazepam.  The patient states that she took "several" Temazepam in an attempt to "just sleep and stop the pain."  The patient endorses chronic lower back pain that is not significantly worse today compared to normal.  When asked if this was an attempt to hurt/kill herself she just repeats "I just want it to stop."  She does endorse auditory hallucinations of voices telling her "you're on track."  Denies any visual hallucinations.  No homicidal ideations.  She states that she took some other medications as well but cannot remember the names unless she is looking at her medication bottles.    Past Medical History:  Diagnosis Date  . Abdominal pain   . Accidental drug overdose April 2013  . Anxiety   . Atrial fibrillation ([place]) 09/29/11   converted spontaneously  . Chronic back pain   . Chronic knee pain   . Chronic nausea   . Chronic pain   . COPD (chronic obstructive pulmonary disease) ([place])   . Depression   . Diabetes mellitus    states her doctor took her off all DM meds in past month  . Diabetic neuropathy ([place])   . Dyspnea    with exertion   . GERD (gastroesophageal reflux disease)   . Headache(784.0)    migraines   . HTN (hypertension)    not on meds since in a year   . Hyperlipidemia   . Hypothyroidism    not on meds in a while   . Mental disorder    Bipolar and schizophrenic  . Requires supplemental oxygen    as needed per patient   . Schizophrenia ([place])   . Tobacco abuse     Patient Active Problem List   Diagnosis Date Noted  . 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 Abdominal pain 04/18/2016  .  Dehydration 04/17/2016  . Morbid obesity ([place]) 03/25/2016  . Type 2 diabetes mellitus with diabetic polyneuropathy, with [place]-term current use of insulin ([place]) 09/06/2015  . Vaginitis and vulvovaginitis, unspecified 04/19/2013  . BV (bacterial vaginosis) 04/19/2013  . HTN (hypertension) 11/04/2011  . Hypokalemia 10/02/2011  . atrial fibrillation 09/30/2011  . Acute respiratory failure ([place]) 09/29/2011  . COPD (chronic obstructive pulmonary disease) ([place]) 09/29/2011  . Accidental drug overdose 09/29/2011    Past Surgical History:  Procedure Laterality Date  . ABDOMINAL HYSTERECTOMY    . BLADDER SUSPENSION  03/04/2011   Procedure: [facility] PROCEDURE;  Surgeon: Elayne Snare MacDiarmid;  Location: [place] [facility];  Service: Urology;  Laterality: N/A;  . CYSTOCELE REPAIR  03/04/2011   Procedure: ANTERIOR REPAIR (CYSTOCELE);  Surgeon: Reece Packer;  Location: [place] [facility];  Service: Urology;  Laterality: N/A;  . CYSTOSCOPY  03/04/2011   Procedure: CYSTOSCOPY;  Surgeon: Elayne Snare MacDiarmid;  Location: [place] [facility];  Service: Urology;  Laterality: N/A;  . ESOPHAGOGASTRODUODENOSCOPY (EGD) WITH PROPOFOL N/A 05/12/2017   Procedure: ESOPHAGOGASTRODUODENOSCOPY (EGD) WITH PROPOFOL;  Surgeon: Alphonsa Overall, MD;  Location: [facility];  Service: General;  Laterality: N/A;  . GASTRIC ROUX-EN-Y N/A 03/25/2016   Procedure: LAPAROSCOPIC ROUX-EN-Y GASTRIC BYPASS WITH UPPER ENDOSCOPY;  Surgeon: Excell Seltzer, MD;  Location: [facility];  Service: General;  Laterality: N/A;  . KNEE SURGERY    . LAPAROSCOPIC ASSISTED VAGINAL HYSTERECTOMY  03/04/2011   Procedure: LAPAROSCOPIC ASSISTED VAGINAL HYSTERECTOMY;  Surgeon: Cyril Mourning, MD;  Location: [facility] [facility];  Service: Gynecology;  Laterality: N/A;    Current Outpatient Rx  . Order #: [id number] Class: Historical Med  . Order #: [id number] Class: Historical Med  . Order #: [id number] Class: Historical Med  . Order #: [id number] Class: Historical Med  . Order #: [id number] Class: Print  . Order  #: [id number] Class: Historical Med  . Order #: [id number] Class: Print  . Order #: [id number] Class: Print  . Order #: [id number] Class: Historical Med  . Order #: [id number] Class: Historical Med  . Order #: [id number] Class: Historical Med  . Order #: [id number] Class: Historical Med  . Order #: [id number] Class: Historical Med  . Order #: [id number] Class: Historical Med  . Order #: [id number] Class: Historical Med  . Order #: [id number] Class: Historical Med  . Order #: [id number] Class: Normal  . Order #: [id number] Class: Historical Med  . Order #: [id number] Class: Historical Med  . Order #: [id number] Class: Print  . Order #: [id number] Class: Historical Med  . Order #: [id number] Class: Historical Med  . Order #: [id number] Class: Print  . Order #: [id number] Class: Historical Med  . Order #: [id number] Class: Historical Med  . Order #: [id number] Class: Historical Med  . Order #: [id number] Class: Historical Med    Allergies Morphine and related and Aspirin  Family History  Problem Relation Age of Onset  . Heart attack Father        70s  . Diabetes Mother   . Heart disease Mother   . Hypertension Mother   . Heart attack Sister        66  . COPD Other   . Breast cancer Neg Hx     Social History Social History   Tobacco Use  . Smoking status: Current Every Day Smoker    Packs/day: 0.50    Types: Cigarettes  . Smokeless tobacco: Never Used  Substance Use Topics  . Alcohol use: Yes  . Drug use: No    Review of Systems  Constitutional: No fever/chills Eyes: No visual changes. ENT: No sore throat. Cardiovascular: Denies chest pain. Respiratory: Denies shortness of breath. Gastrointestinal: No abdominal pain.  No nausea, no vomiting.  No diarrhea.  No constipation. Genitourinary: Negative for dysuria. Musculoskeletal: Positive for chronic back pain. Skin: Negative for rash. Neurological: Negative for headaches, focal weakness or numbness.  10-point ROS otherwise  negative.  ____________________________________________   PHYSICAL EXAM:  VITAL SIGNS: ED Triage Vitals  Enc Vitals Group     BP 11/12/17 1400 132/87     Pulse Rate 11/12/17 1400 88     Resp 11/12/17 1400 (!) 22     Temp 11/12/17 1400 97.6 F (36.4 C)     Temp Source 11/12/17 1400 Oral     SpO2 11/12/17 1354 97 %     Weight 11/12/17 1400 130 lb (59 kg)     Height 11/12/17 1400 5\' 2"  (1.575 m)     Pain Score 11/12/17 1400 0   Constitutional: Alert with some mild confusion.  Eyes: Conjunctivae are normal. PERRL.  Head: Atraumatic. Nose: No congestion/rhinnorhea. Mouth/Throat: Mucous membranes are moist.  Neck: No stridor.  Cardiovascular: Normal rate, regular rhythm. Good peripheral circulation. Grossly normal heart sounds.   Respiratory: Normal respiratory effort.  No retractions. Lungs CTAB. Gastrointestinal: Soft and nontender. No distention.  Musculoskeletal: No lower extremity tenderness nor edema. No gross deformities of extremities. Neurologic:  Normal  speech and language. No gross focal neurologic deficits are appreciated.  Skin:  Skin is warm, dry and intact. No rash noted. Psych: Bizarre affect with frequent pausing and somewhat disorganized in thought process.   ____________________________________________   LABS (all labs ordered are listed, but only abnormal results are displayed)  Labs Reviewed  COMPREHENSIVE METABOLIC PANEL - Abnormal; Notable for the following components:      Result Value   Glucose, Bld 117 (*)    All other components within normal limits  ACETAMINOPHEN LEVEL - Abnormal; Notable for the following components:   Acetaminophen (Tylenol), Serum <10 (*)    All other components within normal limits  RAPID URINE DRUG SCREEN, HOSP PERFORMED - Abnormal; Notable for the following components:   Opiates POSITIVE (*)    Cocaine POSITIVE (*)    Benzodiazepines POSITIVE (*)    Tetrahydrocannabinol POSITIVE (*)    Barbiturates POSITIVE (*)    All  other components within normal limits  ETHANOL  CBC WITH DIFFERENTIAL/PLATELET  SALICYLATE LEVEL  URINALYSIS, ROUTINE W REFLEX MICROSCOPIC   ____________________________________________  EKG   EKG Interpretation  Date/Time:  Thursday Nov 12 2017 14:32:55 EDT Ventricular Rate:  86 PR Interval:    QRS Duration: 89 QT Interval:  356 QTC Calculation: 426 R Axis:   32 Text Interpretation:  Sinus rhythm Probable left atrial enlargement No STEMI.  Confirmed by Nanda Quinton [phone]) on 11/12/2017 3:08:30 PM      ____________________________________________   PROCEDURES  Procedure(s) performed:   Procedures  None ____________________________________________   INITIAL IMPRESSION / ASSESSMENT AND PLAN / ED COURSE  Pertinent labs & imaging results that were available during my care of the patient were reviewed by me and considered in my medical decision making (see chart for details).  Patient presents to the emergency department for evaluation after poorly taking to 50 mg temazepam tablets to help her sleep.  She was found to be confused.  I called the patient's husband on the phone who was aware that she was taken to the emergency department but is not clear on the specific details surrounding this.  Apparently family is in route and may be able to provide additional history.  The patient endorses taking more than the prescribed dose of her temazepam.  She does not specifically endorse suicidal thinking but is making frequent vague statements about "wanting it to stop" and taking a very [place] time to answer when asked directly about suicidal thinking. Her though process is somewhat disorganized. Plan for psychiatry clearance labs and hopefully discussion with family at some point. Continue to monitor here and eventually  04:00 PM With the patient's daughter on the phone, [name] [phone].  She states that she is concerned that her mother was trying to kill herself today.  She and  her other sister were calling her mom today when her mom endorsed to them over the phone that she had attempted suicide.  She tells me that she will deny this to me at this time but they have concerns regarding her mental health as well as her recreational drug use.   I reviewed the patient's lab work in detail and ordered PRN medications.  The patient is medically clear for psychiatry evaluation at this time. Will place patient on IVC.   04:14 PM IVC paperwork filed.  ____________________________________________  FINAL CLINICAL IMPRESSION(S) / ED DIAGNOSES  Final diagnoses:  Suicidal ideation     MEDICATIONS GIVEN DURING THIS VISIT:  Medications  nicotine (NICODERM CQ - dosed  in mg/24 hours) patch 21 mg (has no administration in time range)  alum & mag hydroxide-simeth (MAALOX/MYLANTA) 200-200-20 MG/5ML suspension 30 mL (has no administration in time range)  ondansetron (ZOFRAN) tablet 4 mg (has no administration in time range)  acetaminophen (TYLENOL) tablet 650 mg (has no administration in time range)  albuterol (PROVENTIL HFA;VENTOLIN HFA) 108 (90 Base) MCG/ACT inhaler 2 puff (has no administration in time range)  diclofenac sodium (VOLTAREN) 1 % transdermal gel 4 g (has no administration in time range)  fentaNYL (DURAGESIC - dosed mcg/hr) 100 mcg (has no administration in time range)  fluPHENAZine (PROLIXIN) tablet 5 mg (has no administration in time range)  fluticasone (FLONASE) 50 MCG/ACT nasal spray 1 spray (has no administration in time range)  hydrOXYzine (ATARAX/VISTARIL) tablet 50 mg (has no administration in time range)  lamoTRIgine (LAMICTAL) tablet 25 mg (has no administration in time range)  pantoprazole (PROTONIX) EC tablet 40 mg (has no administration in time range)  simvastatin (ZOCOR) tablet 20 mg (has no administration in time range)  tiotropium (SPIRIVA) inhalation capsule 18 mcg (has no administration in time range)    Note:  This document was prepared using  Dragon voice recognition software and may include unintentional dictation errors.  Nanda Quinton, MD Emergency Medicine    Melayna Robarts, [facility], MD 11/12/17 [phone]

## 2017-11-13 ENCOUNTER — Encounter (HOSPITAL_COMMUNITY): Payer: Self-pay

## 2017-11-13 ENCOUNTER — Other Ambulatory Visit: Payer: Self-pay

## 2017-11-13 DIAGNOSIS — F259 Schizoaffective disorder, unspecified: Secondary | ICD-10-CM | POA: Diagnosis present

## 2017-11-13 DIAGNOSIS — Z62810 Personal history of physical and sexual abuse in childhood: Secondary | ICD-10-CM

## 2017-11-13 DIAGNOSIS — F250 Schizoaffective disorder, bipolar type: Secondary | ICD-10-CM | POA: Diagnosis present

## 2017-11-13 DIAGNOSIS — Z630 Problems in relationship with spouse or partner: Secondary | ICD-10-CM

## 2017-11-13 DIAGNOSIS — J449 Chronic obstructive pulmonary disease, unspecified: Secondary | ICD-10-CM

## 2017-11-13 DIAGNOSIS — Z7951 Long term (current) use of inhaled steroids: Secondary | ICD-10-CM

## 2017-11-13 DIAGNOSIS — F23 Brief psychotic disorder: Secondary | ICD-10-CM

## 2017-11-13 DIAGNOSIS — F17210 Nicotine dependence, cigarettes, uncomplicated: Secondary | ICD-10-CM

## 2017-11-13 DIAGNOSIS — I10 Essential (primary) hypertension: Secondary | ICD-10-CM

## 2017-11-13 DIAGNOSIS — G8929 Other chronic pain: Secondary | ICD-10-CM

## 2017-11-13 DIAGNOSIS — F1910 Other psychoactive substance abuse, uncomplicated: Secondary | ICD-10-CM

## 2017-11-13 DIAGNOSIS — Z79899 Other long term (current) drug therapy: Secondary | ICD-10-CM

## 2017-11-13 DIAGNOSIS — E119 Type 2 diabetes mellitus without complications: Secondary | ICD-10-CM

## 2017-11-13 HISTORY — DX: Brief psychotic disorder: F23

## 2017-11-13 MED ORDER — ALBUTEROL SULFATE HFA 108 (90 BASE) MCG/ACT IN AERS
2.0000 | INHALATION_SPRAY | Freq: Four times a day (QID) | RESPIRATORY_TRACT | Status: DC | PRN
Start: 2017-11-13 — End: 2017-11-16
  Administered 2017-11-14: 2 via RESPIRATORY_TRACT
  Filled 2017-11-13: qty 6.7

## 2017-11-13 MED ORDER — DIPHENHYDRAMINE HCL 25 MG PO CAPS
ORAL_CAPSULE | ORAL | Status: AC
Start: 2017-11-13 — End: 2017-11-13
  Filled 2017-11-13: qty 2

## 2017-11-13 MED ORDER — PALIPERIDONE ER 6 MG PO TB24
6.0000 mg | ORAL_TABLET | Freq: Every day | ORAL | Status: DC
  Administered 2017-11-13: 6 mg via ORAL
  Filled 2017-11-13 (×5): qty 1

## 2017-11-13 MED ORDER — IBUPROFEN 800 MG PO TABS
800.0000 mg | ORAL_TABLET | Freq: Three times a day (TID) | ORAL | Status: DC | PRN
Start: 2017-11-13 — End: 2017-11-16
  Administered 2017-11-13 – 2017-11-16 (×7): 800 mg via ORAL
  Filled 2017-11-13 (×7): qty 1

## 2017-11-13 MED ORDER — ONDANSETRON HCL 4 MG PO TABS
4.0000 mg | ORAL_TABLET | Freq: Three times a day (TID) | ORAL | Status: DC | PRN
  Administered 2017-11-13 – 2017-11-16 (×4): 4 mg via ORAL
  Filled 2017-11-13 (×4): qty 1

## 2017-11-13 MED ORDER — DIPHENHYDRAMINE HCL 50 MG PO CAPS
50.0000 mg | ORAL_CAPSULE | Freq: Once | ORAL | Status: AC
  Administered 2017-11-13: 50 mg via ORAL

## 2017-11-13 MED ORDER — MAGNESIUM HYDROXIDE 400 MG/5ML PO SUSP: 30.0000 mL | Freq: Every day | ORAL | Status: DC | PRN

## 2017-11-13 MED ORDER — FLUTICASONE PROPIONATE 50 MCG/ACT NA SUSP
1.0000 | Freq: Every day | NASAL | Status: DC | PRN
  Administered 2017-11-14: 1 via NASAL
  Filled 2017-11-13: qty 16

## 2017-11-13 MED ORDER — FLUPHENAZINE HCL 5 MG PO TABS
5.0000 mg | ORAL_TABLET | Freq: Every day | ORAL | Status: DC
  Administered 2017-11-13: 5 mg via ORAL
  Filled 2017-11-13 (×2): qty 1

## 2017-11-13 MED ORDER — TIOTROPIUM BROMIDE MONOHYDRATE 18 MCG IN CAPS
18.0000 ug | ORAL_CAPSULE | Freq: Every day | RESPIRATORY_TRACT | Status: DC
  Administered 2017-11-13: 18 ug via RESPIRATORY_TRACT
  Filled 2017-11-13: qty 5

## 2017-11-13 MED ORDER — HYDROXYZINE HCL 50 MG PO TABS
50.0000 mg | ORAL_TABLET | Freq: Three times a day (TID) | ORAL | Status: DC | PRN
  Administered 2017-11-14 – 2017-11-16 (×5): 50 mg via ORAL
  Filled 2017-11-13 (×5): qty 1

## 2017-11-13 MED ORDER — ACETAMINOPHEN 325 MG PO TABS
650.0000 mg | ORAL_TABLET | ORAL | Status: DC | PRN
  Administered 2017-11-13 – 2017-11-16 (×5): 650 mg via ORAL
  Filled 2017-11-13 (×5): qty 2

## 2017-11-13 MED ORDER — PANTOPRAZOLE SODIUM 40 MG PO TBEC: 40.0000 mg | DELAYED_RELEASE_TABLET | Freq: Two times a day (BID) | ORAL | Status: DC | PRN

## 2017-11-13 MED ORDER — TRAZODONE HCL 50 MG PO TABS
50.0000 mg | ORAL_TABLET | Freq: Every evening | ORAL | Status: DC | PRN
  Administered 2017-11-14 – 2017-11-15 (×2): 50 mg via ORAL
  Filled 2017-11-13 (×2): qty 1

## 2017-11-13 MED ORDER — DICLOFENAC SODIUM 1 % TD GEL
4.0000 g | Freq: Four times a day (QID) | TRANSDERMAL | Status: DC | PRN
  Administered 2017-11-13 (×2): 4 g via TOPICAL
  Filled 2017-11-13: qty 100

## 2017-11-13 MED ORDER — IBUPROFEN 800 MG PO TABS
800.0000 mg | ORAL_TABLET | Freq: Once | ORAL | Status: AC
  Administered 2017-11-13: 800 mg via ORAL
  Filled 2017-11-13 (×2): qty 1

## 2017-11-13 MED ORDER — ALUM & MAG HYDROXIDE-SIMETH 200-200-20 MG/5ML PO SUSP: 30.0000 mL | Freq: Four times a day (QID) | ORAL | Status: DC | PRN

## 2017-11-13 MED ORDER — SIMVASTATIN 20 MG PO TABS
20.0000 mg | ORAL_TABLET | Freq: Every day | ORAL | Status: DC
  Administered 2017-11-13 – 2017-11-16 (×3): 20 mg via ORAL
  Filled 2017-11-13 (×5): qty 1

## 2017-11-13 MED ORDER — NICOTINE 21 MG/24HR TD PT24
21.0000 mg | MEDICATED_PATCH | Freq: Every day | TRANSDERMAL | Status: DC
  Administered 2017-11-13 – 2017-11-16 (×4): 21 mg via TRANSDERMAL
  Filled 2017-11-13 (×5): qty 1

## 2017-11-13 NOTE — BHH Suicide Risk Assessment (Signed)
[facility] Admission Suicide Risk Assessment   Nursing information obtained from:    Demographic factors:    Current Mental Status:    Loss Factors:    Historical Factors:    Risk Reduction Factors:     Total Time spent with patient: 1 hour Principal Problem: Schizoaffective disorder, bipolar type ([place]) Diagnosis:   Patient Active Problem List   Diagnosis Date Noted  . Schizoaffective disorder, bipolar type ([place]) [F25.0] 11/13/2017  . Polysubstance abuse ([place]) [F19.10] 11/13/2017  . Adjustment disorder with mixed disturbance of emotions and conduct [F43.25] 07/11/2017  . Abdominal pain [R10.9] 04/18/2016  . Dehydration [E86.0] 04/17/2016  . Morbid obesity ([place]) [E66.01] 03/25/2016  . Type 2 diabetes mellitus with diabetic polyneuropathy, with long-term current use of insulin ([place]) [E11.42, Z79.4] 09/06/2015  . Vaginitis and vulvovaginitis, unspecified [N76.0] 04/19/2013  . BV (bacterial vaginosis) [N76.0, B96.89] 04/19/2013  . HTN (hypertension) [I10] 11/04/2011  . Hypokalemia [E87.6] 10/02/2011  . atrial fibrillation [I48.91] 09/30/2011  . Acute respiratory failure ([place]) [J96.00] 09/29/2011  . COPD (chronic obstructive pulmonary disease) ([place]) [J44.9] 09/29/2011  . Accidental drug overdose [T50.901A] 09/29/2011   Subjective Data: See H&P for details  Continued Clinical Symptoms:  Alcohol Use Disorder Identification Test Final Score (AUDIT): 2 The "Alcohol Use Disorders Identification Test", Guidelines for Use in Primary Care, Second Edition.  World Pharmacologist [facility]). Score between 0-7:  no or low risk or alcohol related problems. Score between 8-15:  moderate risk of alcohol related problems. Score between 16-19:  high risk of alcohol related problems. Score 20 or above:  warrants further diagnostic evaluation for alcohol dependence and treatment.   CLINICAL FACTORS:   Severe Anxiety and/or Agitation Bipolar Disorder:   Mixed State Alcohol/Substance  Abuse/Dependencies Schizophrenia:   Paranoid or undifferentiated type Unstable or Poor Therapeutic Relationship Previous Psychiatric Diagnoses and Treatments Medical Diagnoses and Treatments/Surgeries    Psychiatric Specialty Exam: Physical Exam  Nursing note and vitals reviewed.   ROS- See H&P for details  Blood pressure 138/80, pulse 74, temperature 98.1 F (36.7 C), temperature source Oral, resp. rate 16, height 4' 11.84" (1.52 m), weight 58.1 kg (128 lb), last menstrual period 01/08/2011, SpO2 99 %.Body mass index is 25.13 kg/m.    COGNITIVE FEATURES THAT CONTRIBUTE TO RISK:  Closed-mindedness    SUICIDE RISK:   Mild:  Suicidal ideation of limited frequency, intensity, duration, and specificity.  There are no identifiable plans, no associated intent, mild dysphoria and related symptoms, good self-control (both objective and subjective assessment), few other risk factors, and identifiable protective factors, including available and accessible social support.  PLAN OF CARE: See H&P for details  I certify that inpatient services furnished can reasonably be expected to improve the patient's condition.   Pennelope Bracken, MD 11/13/2017, 12:30 PM
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
## 2017-11-13 NOTE — Progress Notes (Addendum)
Pt was observed in room, seen rolling back and forth in bed. Pt presents with flat/irritable/pained affect and mood. Pt states "I need something for pain but they are not going to give it to me here". Pt denies SI/HI/AVH at this time. Pt was requesting pain meds but it was not time. Pt was also seen vomiting (clear and thick sputum) in trash can loudly and repeatively. It sounded like Pt was forcefully making herself throw-up. Pt was given ginger-ale and PRN Zofran per request. Pt appears to be preoccupied with various somatic complaints.Will continue with POC.

## 2017-11-13 NOTE — Progress Notes (Signed)
Patient ID: [name], female   DOB: May 07, 1966, 52 y.o.   MRN: [id number] Admission Note  Pt is a 52 y/o female admitted onto the 500 I/P adult unit. Pt at arrival appeared to be very drowsy and reluctantly answering admission questions. Pt at the time of admission endorsed moderate depression and anxiety; "I am going to have both for the rest of my life." Pt also endorsed chronic severe back and bilateral knew pain. Pt denied any form of SI/HI; "I only too my pills the way it was prescribed by the doctor, nothing more." Pt admitted to cocaine, THC and alcohol use however, dismisses any addiction to them; "I have only tried the cocaine and the [facility] only once and the alcohol is when I go out with friend." Support, encouragement, and safe environment provided.  15-minute safety checks initiated and continued. Pt remained calm and cooperative through the admission process. Pt remained alert and oriented through the admission process. Pt searched and no contrabands found.

## 2017-11-13 NOTE — Progress Notes (Signed)
Pt woke up from brief nap. Pt was observed trying to make herself throw-up again. Pt was also observed making abnormal movements with tongue (repeatively in/out motion). PRN ibuprofen and Voltaren offered. Pt accepted ibuprofen and heat packs given.Will monitor for changes.

## 2017-11-13 NOTE — Progress Notes (Signed)
Did not attend group 

## 2017-11-13 NOTE — Tx Team (Signed)
Interdisciplinary Treatment and Diagnostic Plan Update  11/13/2017 Time of Session: 3:27 PM  MARGIT BATTE MRN: [id number]  Principal Diagnosis: Schizoaffective disorder, bipolar type [facility])  Secondary Diagnoses: Principal Problem:   Schizoaffective disorder, bipolar type ([place]) Active Problems:   Polysubstance abuse ([place])   Current Medications:  Current Facility-Administered Medications  Medication Dose Route Frequency Provider Last Rate Last Dose  . acetaminophen (TYLENOL) tablet 650 mg  650 mg Oral Q4H PRN Lindon Romp A, NP      . albuterol (PROVENTIL HFA;VENTOLIN HFA) 108 (90 Base) MCG/ACT inhaler 2 puff  2 puff Inhalation Q6H PRN Rozetta Nunnery, NP      . alum & mag hydroxide-simeth (MAALOX/MYLANTA) 200-200-20 MG/5ML suspension 30 mL  30 mL Oral Q6H PRN Lindon Romp A, NP      . diclofenac sodium (VOLTAREN) 1 % transdermal gel 4 g  4 g Topical QID PRN Lindon Romp A, NP   4 g at 11/13/17 1309  . diphenhydrAMINE (BENADRYL) 25 mg capsule           . fluticasone (FLONASE) 50 MCG/ACT nasal spray 1 spray  1 spray Each Nare Daily PRN Lindon Romp A, NP      . hydrOXYzine (ATARAX/VISTARIL) tablet 50 mg  50 mg Oral TID PRN Lindon Romp A, NP      . ibuprofen (ADVIL,MOTRIN) tablet 800 mg  800 mg Oral Q8H PRN Pennelope Bracken, MD   800 mg at 11/13/17 1308  . magnesium hydroxide (MILK OF MAGNESIA) suspension 30 mL  30 mL Oral Daily PRN Lindon Romp A, NP      . nicotine (NICODERM CQ - dosed in mg/24 hours) patch 21 mg  21 mg Transdermal Daily Lindon Romp A, NP   21 mg at 11/13/17 0807  . ondansetron (ZOFRAN) tablet 4 mg  4 mg Oral Q8H PRN Lindon Romp A, NP      . paliperidone (INVEGA) 24 hr tablet 6 mg  6 mg Oral Daily Pennelope Bracken, MD   6 mg at 11/13/17 1238  . pantoprazole (PROTONIX) EC tablet 40 mg  40 mg Oral BID PRN Lindon Romp A, NP      . simvastatin (ZOCOR) tablet 20 mg  20 mg Oral Daily Lindon Romp A, NP   20 mg at 11/13/17 0807  . tiotropium (SPIRIVA)  inhalation capsule 18 mcg  18 mcg Inhalation Daily Lindon Romp A, NP   18 mcg at 11/13/17 0807  . traZODone (DESYREL) tablet 50 mg  50 mg Oral QHS PRN Rozetta Nunnery, NP        PTA Medications: Medications Prior to Admission  Medication Sig Dispense Refill Last Dose  . albuterol (PROAIR HFA) 108 (90 Base) MCG/ACT inhaler Inhale 2 puffs into the lungs every 6 (six) hours as needed for wheezing or shortness of breath.   Past Week at Unknown time  . ALPRAZolam (XANAX) 0.5 MG tablet Take 0.5-1 mg by mouth 2 (two) times daily. Take 1 tablet (0.5 mg) in the morning and Take 2 tablets (1 mg) in the afternoon. Do not take at bedtime   09/09/2017 at Unknown time  . calcium carbonate (OS-CAL) 1250 (500 Ca) MG chewable tablet Chew 1 tablet 2 (two) times daily by mouth.   Past Month at Unknown time  . carisoprodol (SOMA) 350 MG tablet Take 350 mg 3 (three) times daily as needed by mouth for muscle spasms.   0 Past Week at Unknown time  . cyclobenzaprine (FLEXERIL) 5 MG tablet  Take 1 tablet (5 mg total) by mouth 3 (three) times daily as needed for muscle spasms. (Patient not taking: Reported on 09/13/2017) 5 tablet 0 Completed Course at Unknown time  . diclofenac sodium (VOLTAREN) 1 % GEL Apply 4 g 4 (four) times daily as needed topically (for pain).   unknown  . dicyclomine (BENTYL) 20 MG tablet Take 1 tablet (20 mg total) by mouth 2 (two) times daily. (Patient not taking: Reported on 09/13/2017) 20 tablet 0 Completed Course at Unknown time  . diphenoxylate-atropine (LOMOTIL) 2.5-0.025 MG tablet Take 1 tablet by mouth 4 (four) times daily as needed for diarrhea or loose stools. (Patient not taking: Reported on 08/10/2017) 30 tablet 0 Not Taking at Unknown time  . fentaNYL (DURAGESIC - DOSED MCG/HR) 100 MCG/HR Place 100 mcg every 3 (three) days onto the skin.   Past Month at Unknown time  . fluPHENAZine (PROLIXIN) 5 MG tablet Take 5 mg by mouth daily.   09/09/2017 at Unknown time  . fluticasone (FLONASE) 50 MCG/ACT  nasal spray Place 1 spray into both nostrils daily as needed for allergies.    Past Week at Unknown time  . HYDROmorphone (DILAUDID) 8 MG tablet Take 8 mg every 6 (six) hours as needed by mouth for severe pain.    Past Week at Unknown time  . hydrOXYzine (ATARAX/VISTARIL) 50 MG tablet Take 50 mg by mouth 3 (three) times daily as needed for itching.    09/13/2017 at Unknown time  . ibuprofen (ADVIL,MOTRIN) 200 MG tablet Take 400 mg by mouth every 6 (six) hours as needed for moderate pain.   09/12/2017 at Unknown time  . ibuprofen (ADVIL,MOTRIN) 800 MG tablet Take 800 mg every 8 (eight) hours as needed by mouth for headache or moderate pain.    Past Week at Unknown time  . lamoTRIgine (LAMICTAL) 25 MG tablet Take 25 mg by mouth 2 (two) times daily.   09/09/2017  . LATUDA 80 MG TABS tablet Take 1 tablet (80 mg total) by mouth daily. (Patient not taking: Reported on 09/13/2017) 30 tablet 1 Completed Course at Unknown time  . Multiple Vitamins-Iron (MULTIVITAMIN/IRON PO) Take 1 tablet 2 (two) times daily by mouth.   Past Month at Unknown time  . NARCAN 4 MG/0.1ML LIQD nasal spray kit    unknown  . pantoprazole (PROTONIX) 40 MG tablet Take 1 tablet (40 mg total) by mouth 2 (two) times daily. (Patient taking differently: Take 40 mg by mouth 2 (two) times daily as needed (indigestion). ) 60 tablet 2 unknown  . Polyethyl Glycol-Propyl Glycol (SYSTANE) 0.4-0.3 % GEL ophthalmic gel Place 1 application as needed into both eyes (for dry eyes).   unknown  . pregabalin (LYRICA) 100 MG capsule Take 100 mg 3 (three) times daily by mouth.    Past Week at Unknown time  . promethazine (PHENERGAN) 25 MG tablet Take 1 tablet (25 mg total) by mouth every 6 (six) hours as needed for nausea or vomiting. 30 tablet 0 09/12/2017 at Unknown time  . simvastatin (ZOCOR) 20 MG tablet Take 20 mg by mouth daily.    unknown  . SPIRIVA HANDIHALER 18 MCG inhalation capsule Place 1 puff daily as needed into inhaler and inhale (for shortness of  breath or wheezing).    Past Week at Unknown time  . SUMAtriptan 6 MG/0.5ML SOAJ Inject 6 mg daily as needed into the muscle (for migraines).   5 unknown  . SYMBICORT 160-4.5 MCG/ACT inhaler Inhale 2 puffs into the lungs daily as  needed (sob and wheezing).    09/12/2017 at Unknown time    Patient Stressors: Financial difficulties Health problems Marital or family conflict Substance abuse  Patient Strengths: Capable of independent living Communication skills  Treatment Modalities: Medication Management, Group therapy, Case management,  1 to 1 session with clinician, Psychoeducation, Recreational therapy.   Physician Treatment Plan for Primary Diagnosis: Schizoaffective disorder, bipolar type ([place]) Long Term Goal(s): Improvement in symptoms so as ready for discharge  Short Term Goals: Ability to identify and develop effective coping behaviors will improve Ability to identify triggers associated with substance abuse/mental health issues will improve  Medication Management: Evaluate patient's response, side effects, and tolerance of medication regimen.  Therapeutic Interventions: 1 to 1 sessions, Unit Group sessions and Medication administration.  Evaluation of Outcomes: Progressing  Physician Treatment Plan for Secondary Diagnosis: Principal Problem:   Schizoaffective disorder, bipolar type ([place]) Active Problems:   Polysubstance abuse ([place])   Long Term Goal(s): Improvement in symptoms so as ready for discharge  Short Term Goals: Ability to identify and develop effective coping behaviors will improve Ability to identify triggers associated with substance abuse/mental health issues will improve  Medication Management: Evaluate patient's response, side effects, and tolerance of medication regimen.  Therapeutic Interventions: 1 to 1 sessions, Unit Group sessions and Medication administration.  Evaluation of Outcomes: Progressing   RN Treatment Plan for Primary Diagnosis:  Schizoaffective disorder, bipolar type ([place]) Long Term Goal(s): Knowledge of disease and therapeutic regimen to maintain health will improve  Short Term Goals: Ability to identify and develop effective coping behaviors will improve and Compliance with prescribed medications will improve  Medication Management: RN will administer medications as ordered by provider, will assess and evaluate patient's response and provide education to patient for prescribed medication. RN will report any adverse and/or side effects to prescribing provider.  Therapeutic Interventions: 1 on 1 counseling sessions, Psychoeducation, Medication administration, Evaluate responses to treatment, Monitor vital signs and CBGs as ordered, Perform/monitor CIWA, COWS, AIMS and Fall Risk screenings as ordered, Perform wound care treatments as ordered.  Evaluation of Outcomes: Progressing   LCSW Treatment Plan for Primary Diagnosis: Schizoaffective disorder, bipolar type ([place]) Long Term Goal(s): Safe transition to appropriate next level of care at discharge, Engage patient in therapeutic group addressing interpersonal concerns.  Short Term Goals: Engage patient in aftercare planning with referrals and resources  Therapeutic Interventions: Assess for all discharge needs, 1 to 1 time with Social worker, Explore available resources and support systems, Assess for adequacy in community support network, Educate family and significant other(s) on suicide prevention, Complete Psychosocial Assessment, Interpersonal group therapy.  Evaluation of Outcomes: Met  Return home, follow up Dr Laure Kidney   Progress in Treatment: Attending groups: No Participating in groups: No Taking medication as prescribed: Yes Toleration medication: Yes, no side effects reported at this time Family/Significant other contact made: No  Refused to give permission Patient understands diagnosis: No Limited insight Discussing patient identified problems/goals  with staff: Yes Medical problems stabilized or resolved: Yes Denies suicidal/homicidal ideation: Yes Issues/concerns per patient self-inventory: None Other: N/A  New problem(s) identified: None identified at this time.   New Short Term/Long Term Goal(s): "I don't need to be here.  They told me I would only be here for 24 hours of observation. I'm not signing anything."   Discharge Plan or Barriers:   Reason for Continuation of Hospitalization:  Depression  Homicidal ideation  Medication stabilization   Estimated Length of Stay: 5/29  Attendees: Patient: [name] 11/13/2017  3:27 PM  Physician: Maris Berger, MD 11/13/2017  3:27 PM  Nursing: Sena Hitch, RN 11/13/2017  3:27 PM  RN Care Manager: Lars Pinks, RN 11/13/2017  3:27 PM  Social Worker: Ripley Fraise 11/13/2017  3:27 PM  Recreational Therapist: Winfield Cunas 11/13/2017  3:27 PM  Other: Norberto Sorenson 11/13/2017  3:27 PM  Other:  11/13/2017  3:27 PM    Scribe for Treatment Team:  Roque Lias LCSW 11/13/2017 3:27 PM

## 2017-11-13 NOTE — H&P (Signed)
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 Activity:  Normal  Concentration:  Concentration: Fair  Recall:  [employer] of Knowledge:  Fair  Language:  Fair  Akathisia:  No  Handed:    AIMS (if indicated):     Assets:  Resilience  ADL's:  Intact  Cognition:  WNL  Sleep:  Number of Hours: 4.75    Treatment Plan Summary: Daily contact with patient to assess and evaluate symptoms and progress in treatment and Medication management  Observation Level/Precautions:  15 minute checks  Laboratory:  CBC Chemistry Profile HbAIC UDS UA  Psychotherapy:  Encourage participation in groups and therapeutic milieu   Medications:  Start Invega 57m po qDay. Continue vistaril 524mpo q8h prn anxiety. Continue albuterol and spiriva for COPD. Continue ibuprofen/acetaminophen/voltaren prn chronic pain. Continue trazodone 5066mo qhs prn insomnia.  Consultations:    Discharge Concerns:    Estimated LOS: 3-5 days  Other:      Physician Treatment Plan for Primary Diagnosis: Schizoaffective disorder, bipolar type (HCCMurtaughong Term Goal(s): Improvement in symptoms so as ready for discharge  Short Term Goals: Ability to identify and develop effective coping behaviors will improve  Physician Treatment Plan for Secondary Diagnosis: Principal Problem:   Schizoaffective disorder, bipolar type (HCCLos Banosctive Problems:   Polysubstance abuse (HCCManlyLong Term Goal(s): Improvement in symptoms so as  ready for discharge  Short Term Goals: Ability to identify triggers associated with substance abuse/mental health issues will improve  I certify that inpatient services furnished can reasonably be expected to improve the patient's condition.    Pennelope Bracken, MD 5/24/201912:10 PM

## 2017-11-13 NOTE — Tx Team (Signed)
Initial Treatment Plan 11/13/2017 2:24 AM [name] VZC:[id number]    PATIENT STRESSORS: Financial difficulties Health problems Marital or family conflict Substance abuse   PATIENT STRENGTHS: Capable of independent living Communication skills   PATIENT IDENTIFIED PROBLEMS: "I need medicines for pain"  "love to get my medicines straight"  Anxiety  Depression  Substance Abuse  Psychosis           DISCHARGE CRITERIA:  Ability to meet basic life and health needs Verbal commitment to aftercare and medication compliance Withdrawal symptoms are absent or subacute and managed without 24-hour nursing intervention  PRELIMINARY DISCHARGE PLAN: Return to previous living arrangement Return to previous work or school arrangements  PATIENT/FAMILY INVOLVEMENT: This treatment plan has been presented to and reviewed with the patient, [name], and/or family member.  The patient and family have been given the opportunity to ask questions and make suggestions.  Leia Alf, RN 11/13/2017, 2:24 AM

## 2017-11-13 NOTE — Plan of Care (Signed)
Patient was resting in bed with eyes open upon approach. Patient stated her goal for the stay was to "just go home." She claimed she didn't know why she was here. "I woke up and I was in the hospital... I don't need to be here. I don't know why they would put me in here." Although sometimes resistant, patient is compliant with her medications. After morning medication pass, patient's tongue became swollen (looked as if the patient was actually sticking her tongue out rather than swelling), MD notified and ordered 50 mg Benadryl. Patient is still having some involuntary tongue movement 4 hours later. Pt claims it is because she is allergic to Lamictal, but she was not given Lamictal. Breathing is unaffected. Will continue to monitor. Back/knee pain constantly present. Ibuprofen, Tylenol, and gel are being given.  Patient denies SI/HI/AVH. Claims she sometimes hears a voice named Joe, but she has heard him all her life. Patient does not endorse hallucinations today.  Safety maintained with 15 minute checks. Will continue to monitor.    Problem: Safety: Goal: Periods of time without injury will increase Outcome: Progressing  Patient remained safe throughout the shift.

## 2017-11-13 NOTE — [facility] Group Notes (Signed)
LCSW Group Therapy Note  11/13/2017 1:15pm  Type of Therapy/Topic:  Group Therapy:  Feelings about Diagnosis  Participation Level:  Did Not Attend   Description of Group:   This group will allow patients to explore their thoughts and feelings about diagnoses they have received. Patients will be guided to explore their level of understanding and acceptance of these diagnoses. Facilitator will encourage patients to process their thoughts and feelings about the reactions of others to their diagnosis and will guide patients in identifying ways to discuss their diagnosis with significant others in their lives. This group will be process-oriented, with patients participating in exploration of their own experiences, giving and receiving support, and processing challenge from other group members.   Therapeutic Goals: 1. Patient will demonstrate understanding of diagnosis as evidenced by identifying two or more symptoms of the disorder 2. Patient will be able to express two feelings regarding the diagnosis 3. Patient will demonstrate their ability to communicate their needs through discussion and/or role play  Summary of Patient Progress:       Therapeutic Modalities:   Cognitive Behavioral Therapy Brief Therapy Feelings Identification    Trish Mage, LCSW 11/13/2017 1:44 PM

## 2017-11-14 DIAGNOSIS — G4700 Insomnia, unspecified: Secondary | ICD-10-CM

## 2017-11-14 DIAGNOSIS — F419 Anxiety disorder, unspecified: Secondary | ICD-10-CM

## 2017-11-14 DIAGNOSIS — R825 Elevated urine levels of drugs, medicaments and biological substances: Secondary | ICD-10-CM

## 2017-11-14 MED ORDER — DIPHENHYDRAMINE HCL 50 MG/ML IJ SOLN
INTRAMUSCULAR | Status: AC
  Administered 2017-11-14: 50 mg via INTRAMUSCULAR
  Filled 2017-11-14: qty 1

## 2017-11-14 MED ORDER — LORAZEPAM 2 MG/ML IJ SOLN
2.0000 mg | Freq: Once | INTRAMUSCULAR | Status: AC
  Administered 2017-11-14: 2 mg via INTRAVENOUS

## 2017-11-14 MED ORDER — LORAZEPAM 2 MG/ML IJ SOLN
1.0000 mg | Freq: Once | INTRAMUSCULAR | Status: AC
  Administered 2017-11-14: 1 mg via INTRAMUSCULAR

## 2017-11-14 MED ORDER — LORAZEPAM 2 MG/ML IJ SOLN
INTRAMUSCULAR | Status: AC
  Administered 2017-11-14: 2 mg via INTRAVENOUS
  Filled 2017-11-14: qty 1

## 2017-11-14 MED ORDER — DIPHENHYDRAMINE HCL 50 MG/ML IJ SOLN
50.0000 mg | Freq: Once | INTRAMUSCULAR | Status: AC
  Filled 2017-11-14: qty 1

## 2017-11-14 MED ORDER — BENZTROPINE MESYLATE 1 MG/ML IJ SOLN
2.0000 mg | Freq: Once | INTRAMUSCULAR | Status: DC
  Filled 2017-11-14: qty 2

## 2017-11-14 MED ORDER — DIPHENHYDRAMINE HCL 50 MG/ML IJ SOLN
50.0000 mg | Freq: Once | INTRAMUSCULAR | Status: AC
  Administered 2017-11-14 (×2): 50 mg via INTRAMUSCULAR
  Filled 2017-11-14: qty 1

## 2017-11-14 MED ORDER — LORAZEPAM 2 MG/ML IJ SOLN
INTRAMUSCULAR | Status: AC
  Administered 2017-11-14: 1 mg via INTRAMUSCULAR
  Filled 2017-11-14: qty 1

## 2017-11-14 MED ORDER — LORAZEPAM 0.5 MG PO TABS
0.5000 mg | ORAL_TABLET | Freq: Two times a day (BID) | ORAL | Status: DC
  Administered 2017-11-14 – 2017-11-16 (×4): 0.5 mg via ORAL
  Filled 2017-11-14 (×4): qty 1

## 2017-11-14 MED ORDER — BENZTROPINE MESYLATE 1 MG/ML IJ SOLN
INTRAMUSCULAR | Status: AC
Start: 2017-11-14 — End: 2017-11-14
  Filled 2017-11-14: qty 2

## 2017-11-14 NOTE — Progress Notes (Signed)
Patient ID: AKIA MONTALBAN, female   DOB: 1966/04/24, 52 y.o.   MRN: [id number]    D: Pt started off the morning nonverbal and drooling at the mouth. Pt would not talk to staff she would just gag over the trash can. Tanika NP was made aware of situation, orders were noted to give pt IM Ativan and Benadryl. After about one hour on the unit the patient was able to have a full conversation with staff. Pt reported that her jaws were locked up and that she felt as if she could not breathe. Pt thanked staff for helping her and spent most of the day in the day talking to staff and peers. At about 4:30 pm the patient started to display the same behavior, Tanika NP was made aware pt was given a repeat dose of Ativan and Benadryl. After about 58mins the patient was back to having a full conversation with staff and her peers. Pt reported being negative SI/HI, no AH/VH noted. A: 15 min checks continued for patient safety. R: Pt safety maintained.

## 2017-11-14 NOTE — BHH Group Notes (Signed)
[facility] Group Notes: (Clinical Social Work)   11/14/2017      Type of Therapy:  Group Therapy   Participation Level:  Did Not Attend - CSW was asked not to awaken for group   Selmer Dominion, LCSW 11/14/2017, 12:17 PM

## 2017-11-14 NOTE — Progress Notes (Signed)
[facility] MD Progress Note  11/14/2017 12:20 PM [name]  MRN:  [id number]   Subjective: [name] is awake alert and oriented.  Seen resting in bed coughing up saliva.  Reported that patient has been spitting and coughing up saliva for the past 2 days.  Upon approach patient pointing to her throat stating she is unable to swallow.  Nursing staff reports patient has been refusing all medications.  Orders placed for Benadryl/Ativan-intramuscular.  patient was reevaluated 15 minutes later reports she is unsure why she was at the hospital.  Reports she feels the injections worked better for her.  Labs reviewed. -A1c lipid prolactin and TSH pending.  patient is positive for opiates, cocaine, benzodiazepines, marijuana and barbiturates.  Medication documentation was provided by [facility].  Patient denies suicidal ideations or homicidal ideations.  Support encouragement and reassurance was provided.    History [name] is a 52 y/o F with history of schizophrenia and bipolar disorder as well as chronic pain, COPD, DM, HTN, and substance use of multiple substances who presented to [facility] via EMS due to concern for overdose of temazepam. Pt endorsed chronic pain and wanting to stop the pain in the ED, as well as hallucinations, but she was generally a poor historian. She was placed on IVC, and she was transferred to [facility] for additional treatment and evaluation.    Principal Problem: Schizoaffective disorder, bipolar type ([place]) Diagnosis:   Patient Active Problem List   Diagnosis Date Noted  . Schizoaffective disorder, bipolar type ([place]) [F25.0] 11/13/2017  . Polysubstance abuse ([place]) [F19.10] 11/13/2017  . Adjustment disorder with mixed disturbance of emotions and conduct [F43.25] 07/11/2017  . Abdominal pain [R10.9] 04/18/2016  . Dehydration [E86.0] 04/17/2016  . Morbid obesity ([place]) [E66.01] 03/25/2016  . Type 2 diabetes mellitus with diabetic polyneuropathy, with long-term current use of insulin  ([place]) [E11.42, Z79.4] 09/06/2015  . Vaginitis and vulvovaginitis, unspecified [N76.0] 04/19/2013  . BV (bacterial vaginosis) [N76.0, B96.89] 04/19/2013  . HTN (hypertension) [I10] 11/04/2011  . Hypokalemia [E87.6] 10/02/2011  . atrial fibrillation [I48.91] 09/30/2011  . Acute respiratory failure ([place]) [J96.00] 09/29/2011  . COPD (chronic obstructive pulmonary disease) ([place]) [J44.9] 09/29/2011  . Accidental drug overdose [T50.901A] 09/29/2011   Total Time spent with patient: 20 minutes  Past Psychiatric History:   Past Medical History:  Past Medical History:  Diagnosis Date  . Abdominal pain   . Accidental drug overdose April 2013  . Anxiety   . Atrial fibrillation ([place]) 09/29/11   converted spontaneously  . Chronic back pain   . Chronic knee pain   . Chronic nausea   . Chronic pain   . COPD (chronic obstructive pulmonary disease) ([place])   . Depression   . Diabetes mellitus    states her doctor took her off all DM meds in past month  . Diabetic neuropathy ([place])   . Dyspnea    with exertion   . GERD (gastroesophageal reflux disease)   . Headache(784.0)    migraines   . HTN (hypertension)    not on meds since in a year   . Hyperlipidemia   . Hypothyroidism    not on meds in a while   . Mental disorder    Bipolar and schizophrenic  . Requires supplemental oxygen    as needed per patient   . Schizophrenia ([place])   . Schizophrenia, acute ([place]) 11/13/2017  . Tobacco abuse     Past Surgical History:  Procedure Laterality Date  . ABDOMINAL HYSTERECTOMY    .  BLADDER SUSPENSION  03/04/2011   Procedure: [facility] PROCEDURE;  Surgeon: Elayne Snare MacDiarmid;  Location: [facility];  Service: Urology;  Laterality: N/A;  . CYSTOCELE REPAIR  03/04/2011   Procedure: ANTERIOR REPAIR (CYSTOCELE);  Surgeon: Reece Packer;  Location: [facility];  Service: Urology;  Laterality: N/A;  . CYSTOSCOPY  03/04/2011   Procedure: CYSTOSCOPY;  Surgeon: Elayne Snare MacDiarmid;  Location: [facility];  Service: Urology;   Laterality: N/A;  . ESOPHAGOGASTRODUODENOSCOPY (EGD) WITH PROPOFOL N/A 05/12/2017   Procedure: ESOPHAGOGASTRODUODENOSCOPY (EGD) WITH PROPOFOL;  Surgeon: Alphonsa Overall, MD;  Location: [facility];  Service: General;  Laterality: N/A;  . GASTRIC ROUX-EN-Y N/A 03/25/2016   Procedure: LAPAROSCOPIC ROUX-EN-Y GASTRIC BYPASS WITH UPPER ENDOSCOPY;  Surgeon: Excell Seltzer, MD;  Location: [facility];  Service: General;  Laterality: N/A;  . KNEE SURGERY    . LAPAROSCOPIC ASSISTED VAGINAL HYSTERECTOMY  03/04/2011   Procedure: LAPAROSCOPIC ASSISTED VAGINAL HYSTERECTOMY;  Surgeon: Cyril Mourning, MD;  Location: [facility];  Service: Gynecology;  Laterality: N/A;   Family History:  Family History  Problem Relation Age of Onset  . Heart attack Father        3s  . Diabetes Mother   . Heart disease Mother   . Hypertension Mother   . Heart attack Sister        35  . COPD Other   . Breast cancer Neg Hx    Family Psychiatric  History:  Social History:  Social History   Substance and Sexual Activity  Alcohol Use Yes     Social History   Substance and Sexual Activity  Drug Use Yes  . Types: Cocaine, Marijuana    Social History   Socioeconomic History  . Marital status: Legally Separated    Spouse name: Not on file  . Number of children: Not on file  . Years of education: Not on file  . Highest education level: Not on file  Occupational History  . Not on file  Social Needs  . Financial resource strain: Not on file  . Food insecurity:    Worry: Not on file    Inability: Not on file  . Transportation needs:    Medical: Not on file    Non-medical: Not on file  Tobacco Use  . Smoking status: Current Every Day Smoker    Packs/day: 0.50    Types: Cigarettes  . Smokeless tobacco: Never Used  Substance and Sexual Activity  . Alcohol use: Yes  . Drug use: Yes    Types: Cocaine, Marijuana  . Sexual activity: Yes    Partners: Male  Lifestyle  . Physical activity:    Days per week: Not  on file    Minutes per session: Not on file  . Stress: Not on file  Relationships  . Social connections:    Talks on phone: Not on file    Gets together: Not on file    Attends religious service: Not on file    Active member of club or organization: Not on file    Attends meetings of clubs or organizations: Not on file    Relationship status: Not on file  Other Topics Concern  . Not on file  Social History Narrative  . Not on file   Additional Social History:    Pain Medications: see MAR Prescriptions: see MAR Over the Counter: see MAR History of alcohol / drug use?: Yes Longest period of sobriety (when/how long): Unknown Negative Consequences of Use: Personal relationships, Work / [employer],  Financial Withdrawal Symptoms: Agitation, Fever / Chills Name of Substance 1: Barbituarates 1 - Age of First Use: unknown 1 - Amount (size/oz): unknown 1 - Frequency: unknown 1 - Duration: ongoing 1 - Last Use / Amount: recently UDS positive Name of Substance 2: Benzo's 2 - Age of First Use: unknown 2 - Amount (size/oz): unknown 2 - Frequency: unknown 2 - Duration: unknown Name of Substance 3: Opiates 3 - Age of First Use: unknown 3 - Amount (size/oz): unknown 3 - Frequency: unknown 3 - Duration: unknown 3 - Last Use / Amount: recently UDS positive Name of Substance 4: Cocaine 4 - Amount (size/oz): unknown 4 - Frequency: unknown 4 - Duration: unknown Name of Substance 5: THC 5 - Age of First Use: unknown 5 - Amount (size/oz): unknown 5 - Frequency: unknown 5 - Last Use / Amount: recently UDS positive          Sleep: Fair  Appetite:  Fair  Current Medications: Current Facility-Administered Medications  Medication Dose Route Frequency Provider Last Rate Last Dose  . benztropine mesylate (COGENTIN) 1 MG/ML injection           . acetaminophen (TYLENOL) tablet 650 mg  650 mg Oral Q4H PRN Lindon Romp A, NP   650 mg at 11/14/17 0440  . albuterol (PROVENTIL HFA;VENTOLIN  HFA) 108 (90 Base) MCG/ACT inhaler 2 puff  2 puff Inhalation Q6H PRN Lindon Romp A, NP   2 puff at 11/14/17 0655  . alum & mag hydroxide-simeth (MAALOX/MYLANTA) 200-200-20 MG/5ML suspension 30 mL  30 mL Oral Q6H PRN Lindon Romp A, NP      . benztropine mesylate (COGENTIN) injection 2 mg  2 mg Intramuscular Once Derrill Center, NP      . diclofenac sodium (VOLTAREN) 1 % transdermal gel 4 g  4 g Topical QID PRN Lindon Romp A, NP   4 g at 11/13/17 1756  . fluticasone (FLONASE) 50 MCG/ACT nasal spray 1 spray  1 spray Each Nare Daily PRN Lindon Romp A, NP   1 spray at 11/14/17 0700  . hydrOXYzine (ATARAX/VISTARIL) tablet 50 mg  50 mg Oral TID PRN Lindon Romp A, NP   50 mg at 11/14/17 0703  . ibuprofen (ADVIL,MOTRIN) tablet 800 mg  800 mg Oral Q8H PRN Pennelope Bracken, MD   800 mg at 11/14/17 0703  . magnesium hydroxide (MILK OF MAGNESIA) suspension 30 mL  30 mL Oral Daily PRN Lindon Romp A, NP      . nicotine (NICODERM CQ - dosed in mg/24 hours) patch 21 mg  21 mg Transdermal Daily Lindon Romp A, NP   21 mg at 11/13/17 0807  . ondansetron (ZOFRAN) tablet 4 mg  4 mg Oral Q8H PRN Lindon Romp A, NP   4 mg at 11/14/17 0440  . paliperidone (INVEGA) 24 hr tablet 6 mg  6 mg Oral Daily Pennelope Bracken, MD   6 mg at 11/13/17 1238  . pantoprazole (PROTONIX) EC tablet 40 mg  40 mg Oral BID PRN Lindon Romp A, NP      . simvastatin (ZOCOR) tablet 20 mg  20 mg Oral Daily Lindon Romp A, NP   20 mg at 11/13/17 0807  . tiotropium (SPIRIVA) inhalation capsule 18 mcg  18 mcg Inhalation Daily Lindon Romp A, NP   18 mcg at 11/13/17 0807  . traZODone (DESYREL) tablet 50 mg  50 mg Oral QHS PRN Rozetta Nunnery, NP        Lab Results:  Results for orders placed or performed during the hospital encounter of 11/12/17 (from the past 48 hour(s))  Urine rapid drug screen (hosp performed)     Status: Abnormal   Collection Time: 11/12/17  2:27 PM  Result Value Ref Range   Opiates POSITIVE (A) NONE  DETECTED   Cocaine POSITIVE (A) NONE DETECTED   Benzodiazepines POSITIVE (A) NONE DETECTED   Amphetamines NONE DETECTED NONE DETECTED   Tetrahydrocannabinol POSITIVE (A) NONE DETECTED   Barbiturates POSITIVE (A) NONE DETECTED    Comment: (NOTE) DRUG SCREEN FOR MEDICAL PURPOSES ONLY.  IF CONFIRMATION IS NEEDED FOR ANY PURPOSE, NOTIFY LAB WITHIN 5 DAYS. LOWEST DETECTABLE LIMITS FOR URINE DRUG SCREEN Drug Class                     Cutoff (ng/mL) Amphetamine and metabolites    1000 Barbiturate and metabolites    200 Benzodiazepine                 147 Tricyclics and metabolites     300 Opiates and metabolites        300 Cocaine and metabolites        300 THC                            50 Performed at [facility], [place] [address], [place], [place] [address]   Urinalysis, Routine w reflex microscopic     Status: None   Collection Time: 11/12/17  2:27 PM  Result Value Ref Range   Color, Urine YELLOW YELLOW   APPearance CLEAR CLEAR   Specific Gravity, Urine 1.009 1.005 - 1.030   pH 7.0 5.0 - 8.0   Glucose, UA NEGATIVE NEGATIVE mg/dL   Hgb urine dipstick NEGATIVE NEGATIVE   Bilirubin Urine NEGATIVE NEGATIVE   Ketones, ur NEGATIVE NEGATIVE mg/dL   Protein, ur NEGATIVE NEGATIVE mg/dL   Nitrite NEGATIVE NEGATIVE   Leukocytes, UA NEGATIVE NEGATIVE    Comment: Performed at [facility], [place] [address] [place], [place] [address]  Comprehensive metabolic panel     Status: Abnormal   Collection Time: 11/12/17  2:42 PM  Result Value Ref Range   Sodium 141 135 - 145 mmol/L   Potassium 3.9 3.5 - 5.1 mmol/L   Chloride 103 101 - 111 mmol/L   CO2 27 22 - 32 mmol/L   Glucose, Bld 117 (H) 65 - 99 mg/dL   BUN 12 6 - 20 mg/dL   Creatinine, Ser 0.68 0.44 - 1.00 mg/dL   Calcium 9.3 8.9 - 10.3 mg/dL   Total Protein 7.5 6.5 - 8.1 g/dL   Albumin 4.2 3.5 - 5.0 g/dL   AST 17 15 - 41 U/L   ALT 17 14 - 54 U/L   Alkaline Phosphatase 83 38 - 126 U/L   Total  Bilirubin 0.6 0.3 - 1.2 mg/dL   GFR calc non Af Amer >60 >60 mL/min   GFR calc Af Amer >60 >60 mL/min    Comment: (NOTE) The eGFR has been calculated using the CKD EPI equation. This calculation has not been validated in all clinical situations. eGFR's persistently <60 mL/min signify possible Chronic Kidney Disease.    Anion gap 11 5 - 15    Comment: Performed at [facility], [place] [address] [place], [place] [address]  Ethanol     Status: None   Collection Time: 11/12/17  2:42 PM  Result Value  Ref Range   Alcohol, Ethyl (B) <10 <10 mg/dL    Comment: (NOTE) Lowest detectable limit for serum alcohol is 10 mg/dL. For medical purposes only. Performed at [facility], [place] [address] [place], [place] [address]   Acetaminophen level     Status: Abnormal   Collection Time: 11/12/17  2:42 PM  Result Value Ref Range   Acetaminophen (Tylenol), Serum <10 (L) 10 - 30 ug/mL    Comment: (NOTE) Therapeutic concentrations vary significantly. A range of 10-30 ug/mL  may be an effective concentration for many patients. However, some  are best treated at concentrations outside of this range. Acetaminophen concentrations >150 ug/mL at 4 hours after ingestion  and >50 ug/mL at 12 hours after ingestion are often associated with  toxic reactions. Performed at [facility], [place] [address], [place], [place] [address]   CBC with Differential     Status: None   Collection Time: 11/12/17  2:42 PM  Result Value Ref Range   WBC 6.4 4.0 - 10.5 K/uL   RBC 4.57 3.87 - 5.11 MIL/uL   Hemoglobin 13.1 12.0 - 15.0 g/dL   HCT 39.3 36.0 - 46.0 %   MCV 86.0 78.0 - 100.0 fL   MCH 28.7 26.0 - 34.0 pg   MCHC 33.3 30.0 - 36.0 g/dL   RDW 12.9 11.5 - 15.5 %   Platelets 254 150 - 400 K/uL   Neutrophils Relative % 63 %   Neutro Abs 4.0 1.7 - 7.7 K/uL   Lymphocytes Relative 33 %   Lymphs Abs 2.1 0.7 - 4.0 K/uL   Monocytes Relative 3 %   Monocytes Absolute  0.2 0.1 - 1.0 K/uL   Eosinophils Relative 1 %   Eosinophils Absolute 0.1 0.0 - 0.7 K/uL   Basophils Relative 0 %   Basophils Absolute 0.0 0.0 - 0.1 K/uL    Comment: Performed at [facility], [place] [address] [place], [place] [address]  Salicylate level     Status: None   Collection Time: 11/12/17  2:42 PM  Result Value Ref Range   Salicylate Lvl <7.0 2.8 - 30.0 mg/dL    Comment: Performed at [facility], [place] [address] [place],  [address]    Blood Alcohol level:  Lab Results  Component Value Date   ETH <10 11/12/2017   ETH <10 66/29/4765    Metabolic Disorder Labs: Lab Results  Component Value Date   HGBA1C 9.0 02/08/2016   No results found for: PROLACTIN No results found for: CHOL, TRIG, HDL, CHOLHDL, VLDL, LDLCALC  Physical Findings: AIMS: Facial and Oral Movements Muscles of Facial Expression: None, normal Lips and Perioral Area: None, normal Jaw: None, normal Tongue: Mild,Extremity Movements Upper (arms, wrists, hands, fingers): None, normal Lower (legs, knees, ankles, toes): None, normal, Trunk Movements Neck, shoulders, hips: None, normal, Overall Severity Severity of abnormal movements (highest score from questions above): None, normal Incapacitation due to abnormal movements: None, normal Patient's awareness of abnormal movements (rate only patient's report): No Awareness, Dental Status Current problems with teeth and/or dentures?: No Does patient usually wear dentures?: No  CIWA:    COWS:     Musculoskeletal: Strength & Muscle Tone: within normal limits Gait & Station: normal Patient leans: N/A  Psychiatric Specialty Exam: Physical Exam  Vitals reviewed. Constitutional: She is oriented to person, place, and time.  Neurological: She is alert and oriented to person, place, and time.  Psychiatric: She has a normal mood and affect.  Her behavior is normal.    Review of Systems  Psychiatric/Behavioral:  Positive for substance abuse. The patient is nervous/anxious.   All other systems reviewed and are negative.   Blood pressure (!) 143/96, pulse 97, temperature 98.7 F (37.1 C), temperature source Oral, resp. rate 16, height 4' 11.84" (1.52 m), weight 58.1 kg (128 lb), last menstrual period 01/08/2011, SpO2 99 %.Body mass index is 25.13 kg/m.  General Appearance: Casual  Eye Contact:  Fair  Speech:  Clear and Coherent  Volume:  Normal  Mood:  Anxious, Depressed and Dysphoric  Affect:  Congruent  Thought Process:  Coherent  Orientation:  Full (Time, Place, and Person)  Thought Content:  Hallucinations: None  Suicidal Thoughts:  No  Homicidal Thoughts:  No  Memory:  Immediate;   Fair Recent;   Fair  Judgement:  Fair  Insight:  Fair  Psychomotor Activity:  Normal and Restlessness  Concentration:  Concentration: Fair  Recall:  [employer] of Knowledge:  Fair  Language:  Fair  Akathisia:  No  Handed:  Right  AIMS (if indicated):     Assets:  Communication Skills Desire for Improvement Resilience Social Support  ADL's:  Intact  Cognition:  WNL  Sleep:  Number of Hours: 2.75     Treatment Plan Summary: Daily contact with patient to assess and evaluate symptoms and progress in treatment and Medication management  Continue with current treatment plan listed below on 11/14/2017 except were noted  Anxiety:  Initiated Ativan 0.5 p.o. twice daily  (patient is prescribed Xanax 0.5 mg times daily)  Schizoaffective/bipolar:  Continue in Invega 6 mg p.o. Nightly  Initiate Lamictal 25 mg po   Insomnia:  Continue trazodone 50 mg p.o. Nightly  CSW to  continue working on discharge disposition Patient encouraged to attend daily group sessions  Derrill [place], NP 11/14/2017, 12:20 PM

## 2017-11-15 DIAGNOSIS — F1990 Other psychoactive substance use, unspecified, uncomplicated: Secondary | ICD-10-CM

## 2017-11-15 LAB — LIPID PANEL
CHOL/HDL RATIO: 3.2 ratio
Cholesterol: 173 mg/dL (ref 0–200)
HDL: 54 mg/dL (ref >40)
LDL CALC: 97 mg/dL (ref 0–99)
TRIGLYCERIDES: 111 mg/dL (ref <150)
VLDL: 22 mg/dL (ref 0–40)

## 2017-11-15 LAB — HEMOGLOBIN A1C
Hgb A1c MFr Bld: 7.1 % — ABNORMAL HIGH (ref 4.8–5.6)
Mean Plasma Glucose: 157.07 mg/dL

## 2017-11-15 LAB — TSH: TSH: 1.682 u[IU]/mL (ref 0.350–4.500)

## 2017-11-15 MED ORDER — LIDOCAINE 5 % EX PTCH
1.0000 | MEDICATED_PATCH | Freq: Every day | CUTANEOUS | Status: DC
  Administered 2017-11-15 – 2017-11-16 (×2): 1 via TRANSDERMAL
  Filled 2017-11-15 (×4): qty 1

## 2017-11-15 MED ORDER — LAMOTRIGINE 25 MG PO TABS
25.0000 mg | ORAL_TABLET | Freq: Every day | ORAL | Status: DC
  Administered 2017-11-16: 25 mg via ORAL
  Filled 2017-11-15 (×2): qty 1

## 2017-11-15 NOTE — BHH Group Notes (Signed)
[facility] LCSW Group Therapy Note  Date/Time:  11/15/2017  11:00AM-12:00PM  Type of Therapy and Topic:  Group Therapy:  Music and Mood  Participation Level:  Active   Description of Group: In this process group, members listened to a variety of genres of music and identified that different types of music evoke different responses.  Patients were encouraged to identify music that was soothing for them and music that was energizing for them.  Patients discussed how this knowledge can help with wellness and recovery in various ways including managing depression and anxiety as well as encouraging healthy sleep habits.    Therapeutic Goals: 1. Patients will explore the impact of different varieties of music on mood 2. Patients will verbalize the thoughts they have when listening to different types of music 3. Patients will identify music that is soothing to them as well as music that is energizing to them 4. Patients will discuss how to use this knowledge to assist in maintaining wellness and recovery 5. Patients will explore the use of music as a coping skill  Summary of Patient Progress:  At the beginning of group, patient expressed that she felt "antsy" and at the end of group said she felt "mellow."  She was opposed to some of the songs played that were slower, even with a positive message, stating that for her slow music is depressing.  She had a lot of difficulty allowing other patients to listen to music, as she talked a great deal.  Therapeutic Modalities: Solution Focused Brief Therapy Activity   Selmer Dominion, LCSW

## 2017-11-15 NOTE — Progress Notes (Signed)
[facility] MD Progress Note  11/15/2017 11:42 AM [name]  MRN:  [id number]   Subjective: [name] seen sitting in day room interacting with peers.  Patient reports feeling much  better today than she did yesterday and is requesting to be discharged.  Patient reports she thinks she had allergic reaction with the Invega. Reports she has not had any episodes of coughing up saliva, nausea or vomiting today.  Denies depression or depressive symptoms. Denies suicidal or homicidal ideations.  Denies paranoia or psychosis.  Patient is requesting for her daughter and  husband to be contacted as she reports they are requesting for her to be discharged.  Reports she slept well last night.  Patient reports a good appetite.  [place] observed attending group session with active and engaged participation.  NP discontinued in [place].  Patient reports she is allergic to Haldol and Geodon.  Patient to follow-up with attending psychiatrist for medication management on 11/16/2017. Support encouragement reassurance was provided.  History [name] is a 52 y/o F with history of schizophrenia and bipolar disorder as well as chronic pain, COPD, DM, HTN, and substance use of multiple substances who presented to [facility] via EMS due to concern for overdose of temazepam. Pt endorsed chronic pain and wanting to stop the pain in the ED, as well as hallucinations, but she was generally a poor historian. She was placed on IVC, and she was transferred to [place] for additional treatment and evaluation.    Principal Problem: Schizoaffective disorder, bipolar type ([name]) Diagnosis:   Patient Active Problem List   Diagnosis Date Noted  . Schizoaffective disorder, bipolar type ([name]) [F25.0] 11/13/2017  . Polysubstance abuse ([name]) [F19.10] 11/13/2017  . Adjustment disorder with mixed disturbance of emotions and conduct [F43.25] 07/11/2017  . Abdominal pain [R10.9] 04/18/2016  . Dehydration [E86.0] 04/17/2016  . Morbid obesity ([name]) [E66.01]  03/25/2016  . Type 2 diabetes mellitus with diabetic polyneuropathy, with long-term current use of insulin ([name]) [E11.42, Z79.4] 09/06/2015  . Vaginitis and vulvovaginitis, unspecified [N76.0] 04/19/2013  . BV (bacterial vaginosis) [N76.0, B96.89] 04/19/2013  . HTN (hypertension) [I10] 11/04/2011  . Hypokalemia [E87.6] 10/02/2011  . atrial fibrillation [I48.91] 09/30/2011  . Acute respiratory failure ([place]) [J96.00] 09/29/2011  . COPD (chronic obstructive pulmonary disease) ([place]) [J44.9] 09/29/2011  . Accidental drug overdose [T50.901A] 09/29/2011   Total Time spent with patient: 20 minutes  Past Psychiatric History:   Past Medical History:  Past Medical History:  Diagnosis Date  . Abdominal pain   . Accidental drug overdose April 2013  . Anxiety   . Atrial fibrillation ([name]) 09/29/11   converted spontaneously  . Chronic back pain   . Chronic knee pain   . Chronic nausea   . Chronic pain   . COPD (chronic obstructive pulmonary disease) ([place])   . Depression   . Diabetes mellitus    states her doctor took her off all DM meds in past month  . Diabetic neuropathy ([name])   . Dyspnea    with exertion   . GERD (gastroesophageal reflux disease)   . Headache(784.0)    migraines   . HTN (hypertension)    not on meds since in a year   . Hyperlipidemia   . Hypothyroidism    not on meds in a while   . Mental disorder    Bipolar and schizophrenic  . Requires supplemental oxygen    as needed per patient   . Schizophrenia ([name])   . Schizophrenia, acute ([name]) 11/13/2017  .  Tobacco abuse     Past Surgical History:  Procedure Laterality Date  . ABDOMINAL HYSTERECTOMY    . BLADDER SUSPENSION  03/04/2011   Procedure: [facility] PROCEDURE;  Surgeon: Elayne Snare MacDiarmid;  Location: [facility];  Service: Urology;  Laterality: N/A;  . CYSTOCELE REPAIR  03/04/2011   Procedure: ANTERIOR REPAIR (CYSTOCELE);  Surgeon: Reece Packer;  Location: [place] [facility];  Service: Urology;  Laterality: N/A;  .  CYSTOSCOPY  03/04/2011   Procedure: CYSTOSCOPY;  Surgeon: Elayne Snare MacDiarmid;  Location: [facility];  Service: Urology;  Laterality: N/A;  . ESOPHAGOGASTRODUODENOSCOPY (EGD) WITH PROPOFOL N/A 05/12/2017   Procedure: ESOPHAGOGASTRODUODENOSCOPY (EGD) WITH PROPOFOL;  Surgeon: Alphonsa Overall, MD;  Location: [facility];  Service: General;  Laterality: N/A;  . GASTRIC ROUX-EN-Y N/A 03/25/2016   Procedure: LAPAROSCOPIC ROUX-EN-Y GASTRIC BYPASS WITH UPPER ENDOSCOPY;  Surgeon: Excell Seltzer, MD;  Location: [facility];  Service: General;  Laterality: N/A;  . KNEE SURGERY    . LAPAROSCOPIC ASSISTED VAGINAL HYSTERECTOMY  03/04/2011   Procedure: LAPAROSCOPIC ASSISTED VAGINAL HYSTERECTOMY;  Surgeon: Cyril Mourning, MD;  Location: [facility];  Service: Gynecology;  Laterality: N/A;   Family History:  Family History  Problem Relation Age of Onset  . Heart attack Father        2s  . Diabetes Mother   . Heart disease Mother   . Hypertension Mother   . Heart attack Sister        60  . COPD Other   . Breast cancer Neg Hx    Family Psychiatric  History:  Social History:  Social History   Substance and Sexual Activity  Alcohol Use Yes     Social History   Substance and Sexual Activity  Drug Use Yes  . Types: Cocaine, Marijuana    Social History   Socioeconomic History  . Marital status: Legally Separated    Spouse name: Not on file  . Number of children: Not on file  . Years of education: Not on file  . Highest education level: Not on file  Occupational History  . Not on file  Social Needs  . Financial resource strain: Not on file  . Food insecurity:    Worry: Not on file    Inability: Not on file  . Transportation needs:    Medical: Not on file    Non-medical: Not on file  Tobacco Use  . Smoking status: Current Every Day Smoker    Packs/day: 0.50    Types: Cigarettes  . Smokeless tobacco: Never Used  Substance and Sexual Activity  . Alcohol use: Yes  . Drug use: Yes    Types:  Cocaine, Marijuana  . Sexual activity: Yes    Partners: Male  Lifestyle  . Physical activity:    Days per week: Not on file    Minutes per session: Not on file  . Stress: Not on file  Relationships  . Social connections:    Talks on phone: Not on file    Gets together: Not on file    Attends religious service: Not on file    Active member of club or organization: Not on file    Attends meetings of clubs or organizations: Not on file    Relationship status: Not on file  Other Topics Concern  . Not on file  Social History Narrative  . Not on file   Additional Social History:    Pain Medications: see MAR Prescriptions: see MAR Over the Counter: see MAR History of  alcohol / drug use?: Yes Longest period of sobriety (when/how long): Unknown Negative Consequences of Use: Personal relationships, Work / Youth worker, Museum/gallery curator Withdrawal Symptoms: Agitation, Fever / Chills Name of Substance 1: Barbituarates 1 - Age of First Use: unknown 1 - Amount (size/oz): unknown 1 - Frequency: unknown 1 - Duration: ongoing 1 - Last Use / Amount: recently UDS positive Name of Substance 2: Benzo's 2 - Age of First Use: unknown 2 - Amount (size/oz): unknown 2 - Frequency: unknown 2 - Duration: unknown Name of Substance 3: Opiates 3 - Age of First Use: unknown 3 - Amount (size/oz): unknown 3 - Frequency: unknown 3 - Duration: unknown 3 - Last Use / Amount: recently UDS positive Name of Substance 4: Cocaine 4 - Amount (size/oz): unknown 4 - Frequency: unknown 4 - Duration: unknown Name of Substance 5: THC 5 - Age of First Use: unknown 5 - Amount (size/oz): unknown 5 - Frequency: unknown 5 - Last Use / Amount: recently UDS positive          Sleep: Fair  Appetite:  Fair  Current Medications: Current Facility-Administered Medications  Medication Dose Route Frequency Provider Last Rate Last Dose  . acetaminophen (TYLENOL) tablet 650 mg  650 mg Oral Q4H PRN Lindon Romp A, NP   650 mg  at 11/14/17 0440  . albuterol (PROVENTIL HFA;VENTOLIN HFA) 108 (90 Base) MCG/ACT inhaler 2 puff  2 puff Inhalation Q6H PRN Lindon Romp A, NP   2 puff at 11/14/17 0655  . alum & mag hydroxide-simeth (MAALOX/MYLANTA) 200-200-20 MG/5ML suspension 30 mL  30 mL Oral Q6H PRN Lindon Romp A, NP      . benztropine mesylate (COGENTIN) injection 2 mg  2 mg Intramuscular Once Derrill Center, NP      . diclofenac sodium (VOLTAREN) 1 % transdermal gel 4 g  4 g Topical QID PRN Lindon Romp A, NP   4 g at 11/13/17 1756  . fluticasone (FLONASE) 50 MCG/ACT nasal spray 1 spray  1 spray Each Nare Daily PRN Lindon Romp A, NP   1 spray at 11/14/17 0700  . hydrOXYzine (ATARAX/VISTARIL) tablet 50 mg  50 mg Oral TID PRN Rozetta Nunnery, NP   50 mg at 11/14/17 2116  . ibuprofen (ADVIL,MOTRIN) tablet 800 mg  800 mg Oral Q8H PRN Pennelope Bracken, MD   800 mg at 11/15/17 0747  . lidocaine (LIDODERM) 5 % 1 patch  1 patch Transdermal Daily Derrill Center, NP   1 patch at 11/15/17 1002  . LORazepam (ATIVAN) tablet 0.5 mg  0.5 mg Oral BID Derrill Center, NP   0.5 mg at 11/15/17 0746  . magnesium hydroxide (MILK OF MAGNESIA) suspension 30 mL  30 mL Oral Daily PRN Lindon Romp A, NP      . nicotine (NICODERM CQ - dosed in mg/24 hours) patch 21 mg  21 mg Transdermal Daily Lindon Romp A, NP   21 mg at 11/15/17 0740  . ondansetron (ZOFRAN) tablet 4 mg  4 mg Oral Q8H PRN Lindon Romp A, NP   4 mg at 11/14/17 2115  . pantoprazole (PROTONIX) EC tablet 40 mg  40 mg Oral BID PRN Lindon Romp A, NP      . simvastatin (ZOCOR) tablet 20 mg  20 mg Oral Daily Lindon Romp A, NP   20 mg at 11/15/17 0742  . tiotropium (SPIRIVA) inhalation capsule 18 mcg  18 mcg Inhalation Daily Lindon Romp A, NP   18 mcg at 11/13/17 0807  .  traZODone (DESYREL) tablet 50 mg  50 mg Oral QHS PRN Rozetta Nunnery, NP   50 mg at 11/14/17 2116    Lab Results:  Results for orders placed or performed during the hospital encounter of 11/12/17 (from the past  48 hour(s))  Lipid panel     Status: None   Collection Time: 11/15/17  6:16 AM  Result Value Ref Range   Cholesterol 173 0 - 200 mg/dL   Triglycerides 111 <150 mg/dL   HDL 54 >40 mg/dL   Total CHOL/HDL Ratio 3.2 RATIO   VLDL 22 0 - 40 mg/dL   LDL Cholesterol 97 0 - 99 mg/dL    Comment:        Total Cholesterol/HDL:CHD Risk Coronary Heart Disease Risk Table                     Men   Women  1/2 Average Risk   3.4   3.3  Average Risk       5.0   4.4  2 X Average Risk   9.6   7.1  3 X Average Risk  23.4   11.0        Use the calculated Patient Ratio above and the CHD Risk Table to determine the patient's CHD Risk.        ATP III CLASSIFICATION (LDL):  <100     mg/dL   Optimal  100-129  mg/dL   Near or Above                    Optimal  130-159  mg/dL   Borderline  160-189  mg/dL   High  >190     mg/dL   Very High Performed at [facility] [address] [place], [place] [address]   TSH     Status: None   Collection Time: 11/15/17  6:16 AM  Result Value Ref Range   TSH 1.682 0.350 - 4.500 uIU/mL    Comment: Performed by a 3rd Generation assay with a functional sensitivity of <=0.01 uIU/mL. Performed at [facility], [place] [address], [place], [place] [address]     Blood Alcohol level:  Lab Results  Component Value Date   ETH <10 11/12/2017   ETH <10 03/47/4259    Metabolic Disorder Labs: Lab Results  Component Value Date   HGBA1C 9.0 02/08/2016   No results found for: PROLACTIN Lab Results  Component Value Date   CHOL 173 11/15/2017   TRIG 111 11/15/2017   HDL 54 11/15/2017   CHOLHDL 3.2 11/15/2017   VLDL 22 11/15/2017   LDLCALC 97 11/15/2017    Physical Findings: AIMS: Facial and Oral Movements Muscles of Facial Expression: None, normal Lips and Perioral Area: None, normal Jaw: None, normal Tongue: Mild,Extremity Movements Upper (arms, wrists, hands, fingers): None, normal Lower (legs, knees, ankles, toes): None,  normal, Trunk Movements Neck, shoulders, hips: None, normal, Overall Severity Severity of abnormal movements (highest score from questions above): None, normal Incapacitation due to abnormal movements: None, normal Patient's awareness of abnormal movements (rate only patient's report): No Awareness, Dental Status Current problems with teeth and/or dentures?: No Does patient usually wear dentures?: No  CIWA:    COWS:     Musculoskeletal: Strength & Muscle Tone: within normal limits Gait & Station: normal Patient leans: N/A  Psychiatric Specialty Exam: Physical Exam  Vitals reviewed. Constitutional: She is oriented to person, place, and time.  Neurological: She is alert and  oriented to person, place, and time.  Psychiatric: She has a normal mood and affect. Her behavior is normal.    Review of Systems  Psychiatric/Behavioral: Positive for substance abuse. The patient is nervous/anxious.   All other systems reviewed and are negative.   Blood pressure 95/64, pulse (!) 108, temperature 98.6 F (37 C), temperature source Oral, resp. rate 18, height 4' 11.84" (1.52 m), weight 58.1 kg (128 lb), last menstrual period 01/08/2011, SpO2 94 %.Body mass index is 25.13 kg/m.  General Appearance: Casual  Eye Contact:  Fair  Speech:  Clear and Coherent  Volume:  Normal  Mood:  Anxious, Depressed and Dysphoric  Affect:  Congruent  Thought Process:  Coherent  Orientation:  Full (Time, Place, and Person)  Thought Content:  Hallucinations: None  Suicidal Thoughts:  No  Homicidal Thoughts:  No  Memory:  Immediate;   Fair Recent;   Fair  Judgement:  Fair  Insight:  Fair  Psychomotor Activity:  Normal and Restlessness  Concentration:  Concentration: Fair  Recall:  [employer] of Knowledge:  Fair  Language:  Fair  Akathisia:  No  Handed:  Right  AIMS (if indicated):     Assets:  Communication Skills Desire for Improvement Resilience Social Support  ADL's:  Intact  Cognition:  WNL   Sleep:  Number of Hours: 5.75     Treatment Plan Summary: Daily contact with patient to assess and evaluate symptoms and progress in treatment and Medication management  Continue with current treatment plan listed below on 11/15/2017 except were noted  Anxiety:  Continue Ativan 0.5 p.o. twice daily  (patient is prescribed Xanax 0.5 mg times daily)  Schizoaffective/bipolar:   Discontinue in Invega 6 mg p.o. Nightly  Continue Lamictal 25 mg po - patient is refusing   Insomnia:  Continue trazodone 50 mg p.o. Nightly--med list provided by [facility]  CSW to  continue working on discharge disposition Patient encouraged to attend daily group sessions  Derrill Center, NP 11/15/2017, 11:42 AM

## 2017-11-15 NOTE — Progress Notes (Signed)
D:  Patient's self inventory sheet, patient sleeps good, sleep medication helpful.  Good appetite, normal energy level, good concentration.  Denied depression and hopeless, anxiety #4.  Denied SI.  Goal is discharge. A:  Medications administered per MD orders.  Emotional support and encouragement given patient. R:  Denied SI and HI contracts for safety.  Denied A/V hallucinations.  Safety maintained with 15 minute checks.

## 2017-11-15 NOTE — Progress Notes (Signed)
Patient ID: [name], female   DOB: [birth year], 52 y.o.   MRN: [id number] DAR Note: Pt continue to be very somatic. Pt endorsed severe generalized body pain; "I hurt everywhere." Pt also complained of moderate anxiety and depression. Pt denies AVH or SI/HI. Pt remained in room for most of the evening. Pt contracts for safety. Medications offered as prescribed. All patient's questions and concerns addressed. Support, encouragement, and safe environment provided. 15-minute safety checks continue. Pt was med compliant. Safety checks continue.

## 2017-11-15 NOTE — Plan of Care (Signed)
Nurse discussed depression, anxiety, coping skills with patient.  

## 2017-11-15 NOTE — BHH Group Notes (Signed)
[place] Group Notes:  (Nursing/MHT/Case Management/Adjunct)  Date:  11/15/2017  Time:  11:20 AM  Type of Therapy:  Psychoeducational Skills  Participation Level:  Active  Participation Quality:  Appropriate  Affect:  Appropriate  Cognitive:  Appropriate  Insight:  Appropriate  Engagement in Group:  Engaged  Modes of Intervention:  Problem-solving  Summary of Progress/Problems: Pt attended Psychoeducational group with top topic healthy support systems.  Benancio Deeds Shanta 11/15/2017, 11:20 AM

## 2017-11-15 NOTE — BHH Group Notes (Signed)
[place] Group Notes:  (Nursing/MHT/Case Management/Adjunct)  Date:  11/15/2017  Time:  11:20 AM  Type of Therapy:  Goals/Orientation Group.  Participation Level:  Active  Participation Quality:  Appropriate  Affect:  Appropriate  Cognitive:  Appropriate  Insight:  Appropriate  Engagement in Group:  Engaged  Modes of Intervention:  Discussion  Summary of Progress/Problems: Pt attended goals/orientation group, pt was receptive.  [name] 11/15/2017, 11:20 AM

## 2017-11-16 LAB — PROLACTIN: Prolactin: 32.8 ng/mL — ABNORMAL HIGH (ref 4.8–23.3)

## 2017-11-16 MED ORDER — SYMBICORT 160-4.5 MCG/ACT IN AERO: 2.0000 | INHALATION_SPRAY | Freq: Every day | RESPIRATORY_TRACT | 0 refills | Status: DC | PRN

## 2017-11-16 MED ORDER — LAMOTRIGINE 25 MG PO TABS: 25.0000 mg | ORAL_TABLET | Freq: Every day | ORAL | 0 refills | Status: DC

## 2017-11-16 MED ORDER — NICOTINE 21 MG/24HR TD PT24: 21.0000 mg | MEDICATED_PATCH | Freq: Every day | TRANSDERMAL | 0 refills | Status: DC

## 2017-11-16 MED ORDER — ALBUTEROL SULFATE HFA 108 (90 BASE) MCG/ACT IN AERS: 2.0000 | INHALATION_SPRAY | Freq: Four times a day (QID) | RESPIRATORY_TRACT | Status: DC | PRN

## 2017-11-16 MED ORDER — LIDOCAINE 5 % EX PTCH: 1.0000 | MEDICATED_PATCH | Freq: Every day | CUTANEOUS | 0 refills | Status: DC

## 2017-11-16 MED ORDER — TIOTROPIUM BROMIDE MONOHYDRATE 18 MCG IN CAPS: 18.0000 ug | ORAL_CAPSULE | Freq: Every day | RESPIRATORY_TRACT | 0 refills | Status: DC

## 2017-11-16 MED ORDER — PANTOPRAZOLE SODIUM 40 MG PO TBEC: 40.0000 mg | DELAYED_RELEASE_TABLET | Freq: Two times a day (BID) | ORAL | 0 refills | Status: DC | PRN

## 2017-11-16 MED ORDER — ALPRAZOLAM 0.5 MG PO TABS: 0.5000 mg | ORAL_TABLET | Freq: Two times a day (BID) | ORAL | 0 refills | Status: DC

## 2017-11-16 MED ORDER — SIMVASTATIN 20 MG PO TABS: 20.0000 mg | ORAL_TABLET | Freq: Every day | ORAL | 0 refills | Status: DC

## 2017-11-16 MED ORDER — TRAZODONE HCL 50 MG PO TABS: 50.0000 mg | ORAL_TABLET | Freq: Every evening | ORAL | 0 refills | Status: DC | PRN

## 2017-11-16 MED ORDER — DICLOFENAC SODIUM 1 % TD GEL: 4.0000 g | Freq: Four times a day (QID) | TRANSDERMAL | Status: DC | PRN

## 2017-11-16 NOTE — Progress Notes (Signed)
Patient signed Voluntary Form on 11/16/2017 at 0950 per MD instructions.

## 2017-11-16 NOTE — BHH Suicide Risk Assessment (Signed)
[facility] INPATIENT:  Family/Significant Other Suicide Prevention Education  Suicide Prevention Education:  Education Completed; Lynetta Tomczak, daughter, [phone] has been identified by the patient as the family member/significant other with whom the patient will be residing, and identified as the person(s) who will aid the patient in the event of a mental health crisis (suicidal ideations/suicide attempt).  With written consent from the patient, the family member/significant other has been provided the following suicide prevention education, prior to the and/or following the discharge of the patient.  The suicide prevention education provided includes the following:  Suicide risk factors  Suicide prevention and interventions  National Suicide Hotline telephone number  [facility] assessment telephone number  [facility] Emergency Assistance [place] and/or Residential Mobile Crisis Unit telephone number  Request made of family/significant other to:  Remove weapons (e.g., guns, rifles, knives), all items previously/currently identified as safety concern.    Remove drugs/medications (over-the-counter, prescriptions, illicit drugs), all items previously/currently identified as a safety concern.  The family member/significant other verbalizes understanding of the suicide prevention education information provided.  The family member/significant other agrees to remove the items of safety concern listed above.  Trish Mage 11/16/2017, 9:26 AM

## 2017-11-16 NOTE — Progress Notes (Signed)
Patient ID: [name], female   DOB: Jan 01, 1966, 52 y.o.   MRN: [id number] DAR Note: Pt continue to be very somatic. Pt continue to endorse severe generalized body pain; "I hurt everywhere; its constant." Pt also complained of moderate anxiety and depression. Pt denies AVH or SI/HI. Pt remained in room for most of the evening. Pt contracts for safety. Medications offered as prescribed. All patient's questions and concerns addressed. Support, encouragement, and safe environment provided. 15-minute safety checks continue. Pt was med compliant. Safety checks continue.

## 2017-11-16 NOTE — Progress Notes (Signed)
Discharge Note:  Patient discharged today with family member.  Patient denied SI and HI.  Denied A/V hallucinations.  Denied pain.  Suicide prevention information given and discussed with patient who stated she understood and had no questions.  Patient stated she received all her belongings, clothing, toiletries, misc items, etc.  Patient stated she appreciated all assistance received from [facility] staff.  All required discharge information given to patient at discharge.

## 2017-11-16 NOTE — BHH Suicide Risk Assessment (Signed)
[facility] Discharge Suicide Risk Assessment   Principal Problem: Schizoaffective disorder, bipolar type [facility]) Discharge Diagnoses:  Patient Active Problem List   Diagnosis Date Noted  . Schizoaffective disorder, bipolar type ([place]) [F25.0] 11/13/2017  . Polysubstance abuse ([place]) [F19.10] 11/13/2017  . Adjustment disorder with mixed disturbance of emotions and conduct [F43.25] 07/11/2017  . Abdominal pain [R10.9] 04/18/2016  . Dehydration [E86.0] 04/17/2016  . Morbid obesity ([place]) [E66.01] 03/25/2016  . Type 2 diabetes mellitus with diabetic polyneuropathy, with long-term current use of insulin ([place]) [E11.42, Z79.4] 09/06/2015  . Vaginitis and vulvovaginitis, unspecified [N76.0] 04/19/2013  . BV (bacterial vaginosis) [N76.0, B96.89] 04/19/2013  . HTN (hypertension) [I10] 11/04/2011  . Hypokalemia [E87.6] 10/02/2011  . atrial fibrillation [I48.91] 09/30/2011  . Acute respiratory failure ([place]) [J96.00] 09/29/2011  . COPD (chronic obstructive pulmonary disease) ([place]) [J44.9] 09/29/2011  . Accidental drug overdose [T50.901A] 09/29/2011    Total Time spent with patient: 30 minutes  Musculoskeletal: Strength & Muscle Tone: within normal limits Gait & Station: normal Patient leans: N/A  Psychiatric Specialty Exam: Review of Systems  Constitutional: Negative for chills and fever.  Respiratory: Negative for cough and shortness of breath.   Cardiovascular: Negative for chest pain.  Gastrointestinal: Negative for abdominal pain, heartburn, nausea and vomiting.  Psychiatric/Behavioral: Negative for depression, hallucinations and suicidal ideas. The patient is not nervous/anxious and does not have insomnia.     Blood pressure 112/78, pulse 85, temperature 98.9 F (37.2 C), temperature source Oral, resp. rate 18, height 4' 11.84" (1.52 m), weight 58.1 kg (128 lb), last menstrual period 01/08/2011, SpO2 94 %.Body mass index is 25.13 kg/m.  General Appearance: Casual and Fairly Groomed  Chemical engineer::  Good  Speech:  Clear and Coherent and Normal Rate  Volume:  Normal  Mood:  Euthymic  Affect:  Appropriate and Congruent  Thought Process:  Coherent and Goal Directed  Orientation:  Full (Time, Place, and Person)  Thought Content:  Logical  Suicidal Thoughts:  No  Homicidal Thoughts:  No  Memory:  Immediate;   Fair Recent;   Fair Remote;   Fair  Judgement:  Fair  Insight:  Fair  Psychomotor Activity:  Normal  Concentration:  Good  Recall:  [employer] of Knowledge:Fair  Language: Fair  Akathisia:  No  Handed:    AIMS (if indicated):     Assets:  Resilience Social Support  Sleep:  Number of Hours: 4.25  Cognition: WNL  ADL's:  Intact   Mental Status Per Nursing Assessment::   On Admission:     Demographic Factors:  Low socioeconomic status  Loss Factors: Financial problems/change in socioeconomic status  Historical Factors: Impulsivity  Risk Reduction Factors:   Sense of responsibility to family, Positive social support, Positive therapeutic relationship and Positive coping skills or problem solving skills  Continued Clinical Symptoms:  Severe Anxiety and/or Agitation Bipolar Disorder:   Mixed State Alcohol/Substance Abuse/Dependencies Schizophrenia:   Paranoid or undifferentiated type  Cognitive Features That Contribute To Risk:  None    Suicide Risk:  Minimal: No identifiable suicidal ideation.  Patients presenting with no risk factors but with morbid ruminations; may be classified as minimal risk based on the severity of the depressive symptoms  Follow-up [place] Follow up on 11/18/2017.   Why:  Wednesday at 3:30 with Dr Laure Kidney. Contact information: [place] [address] [phone]         Subjective Data: [name] is a 52 y/o  F with history of schizophrenia and bipolar disorder as well as chronic pain, COPD, DM, HTN, and substance use of multiple substances who presented to  [facility] via EMS due to concern for overdose of temazepam. Pt endorsed chronic pain and wanting to stop the pain in the ED, as well as hallucinations, but she was generally a poor historian. She was placed on IVC, and she was transferred to [facility] for additional treatment and evaluation. Pt was started on trial of Invega, which she initially took for one dose but then refused. She was also started on previous home medication of lamictal at starting dose. Pt reported improvement of her presenting symptoms during her stay.  Today upon evaluation, pt shares, "Things are going fine." She denies any specific complaints. She is sleeping well. Her appetite is good. She denies other physical complaints including exacerbation of her chronic pain. She denies SI/HI/AH/VH. She is tolerating her medication regimen well, and she denies any side effects. She is in agreement to continue her current treatment regimen without changes. She plans to follow up with her previous outpatient provider, and we discussed importance of titration of dose of lamictal and/or starting trial of an additional psychotropic agent, and pt was in agreement to follow up with these topics with her outpatient provider. She was able to engage in safety planning including plan to return to [facility] or contact emergency services if she feels unable to maintain her own safety or the safety of others. Pt had no further questions, comments, or concerns.    Plan Of Care/Follow-up recommendations:   -Discharge to outpatient level of care  -Schizoaffective/bipolar:           -Continue Lamictal 25 mg po   -Insomnia:            -Continue trazodone 50 mg p.o. Nightly  Activity:  as tolerated Diet:  normal Tests:  NA Other:  see above for DC plan  Pennelope Bracken, MD 11/16/2017, 10:04 AM

## 2017-11-16 NOTE — Discharge Summary (Addendum)
Physician Discharge Summary Note  Patient:  [name] is an 52 y.o., female MRN:  [id number] DOB:  24-Aug-1965 Patient phone:  [phone] (home)  Patient address:   [address] [place] [address],   Total Time spent with patient: Greater than 30 minutes  Date of Admission:  11/12/2017  Date of Discharge: 11-16-17  Reason for Admission: Overdose on temazepam.  Principal Problem: Schizoaffective disorder, bipolar type [facility])  Discharge Diagnoses: Patient Active Problem List   Diagnosis Date Noted  . Schizoaffective disorder, bipolar type ([place]) [F25.0] 11/13/2017  . Polysubstance abuse ([place]) [F19.10] 11/13/2017  . Adjustment disorder with mixed disturbance of emotions and conduct [F43.25] 07/11/2017  . Abdominal pain [R10.9] 04/18/2016  . Dehydration [E86.0] 04/17/2016  . Morbid obesity ([place]) [E66.01] 03/25/2016  . Type 2 diabetes mellitus with diabetic polyneuropathy, with long-term current use of insulin ([place]) [E11.42, Z79.4] 09/06/2015  . Vaginitis and vulvovaginitis, unspecified [N76.0] 04/19/2013  . BV (bacterial vaginosis) [N76.0, B96.89] 04/19/2013  . HTN (hypertension) [I10] 11/04/2011  . Hypokalemia [E87.6] 10/02/2011  . atrial fibrillation [I48.91] 09/30/2011  . Acute respiratory failure ([place]) [J96.00] 09/29/2011  . COPD (chronic obstructive pulmonary disease) ([place]) [J44.9] 09/29/2011  . Accidental drug overdose [T50.901A] 09/29/2011   Past Psychiatric History: Schizoaffective disorder, bipolar-type, Polysubstance use disorders.  Past Medical History:  Past Medical History:  Diagnosis Date  . Abdominal pain   . Accidental drug overdose April 2013  . Anxiety   . Atrial fibrillation ([place]) 09/29/11   converted spontaneously  . Chronic back pain   . Chronic knee pain   . Chronic nausea   . Chronic pain   . COPD (chronic obstructive pulmonary disease) ([place])   . Depression   . Diabetes mellitus    states her doctor took her off all DM meds in past  month  . Diabetic neuropathy ([place])   . Dyspnea    with exertion   . GERD (gastroesophageal reflux disease)   . Headache(784.0)    migraines   . HTN (hypertension)    not on meds since in a year   . Hyperlipidemia   . Hypothyroidism    not on meds in a while   . Mental disorder    Bipolar and schizophrenic  . Requires supplemental oxygen    as needed per patient   . Schizophrenia ([place])   . Schizophrenia, acute ([place]) 11/13/2017  . Tobacco abuse     Past Surgical History:  Procedure Laterality Date  . ABDOMINAL HYSTERECTOMY    . BLADDER SUSPENSION  03/04/2011   Procedure: [facility] PROCEDURE;  Surgeon: Elayne Snare MacDiarmid;  Location: [facility];  Service: Urology;  Laterality: N/A;  . CYSTOCELE REPAIR  03/04/2011   Procedure: ANTERIOR REPAIR (CYSTOCELE);  Surgeon: Reece Packer;  Location: [facility];  Service: Urology;  Laterality: N/A;  . CYSTOSCOPY  03/04/2011   Procedure: CYSTOSCOPY;  Surgeon: Elayne Snare MacDiarmid;  Location: [facility];  Service: Urology;  Laterality: N/A;  . ESOPHAGOGASTRODUODENOSCOPY (EGD) WITH PROPOFOL N/A 05/12/2017   Procedure: ESOPHAGOGASTRODUODENOSCOPY (EGD) WITH PROPOFOL;  Surgeon: Alphonsa Overall, MD;  Location: [facility];  Service: General;  Laterality: N/A;  . GASTRIC ROUX-EN-Y N/A 03/25/2016   Procedure: LAPAROSCOPIC ROUX-EN-Y GASTRIC BYPASS WITH UPPER ENDOSCOPY;  Surgeon: Excell Seltzer, MD;  Location: [facility];  Service: General;  Laterality: N/A;  . KNEE SURGERY    . LAPAROSCOPIC ASSISTED VAGINAL HYSTERECTOMY  03/04/2011   Procedure: LAPAROSCOPIC ASSISTED VAGINAL HYSTERECTOMY;  Surgeon: Cyril Mourning, MD;  Location: [facility];  Service: Gynecology;  Laterality: N/A;   Family History:  Family History  Problem Relation Age of Onset  . Heart attack Father        69s  . Diabetes Mother   . Heart disease Mother   . Hypertension Mother   . Heart attack Sister        55  . COPD Other   . Breast cancer Neg Hx    Family Psychiatric  History: See H&P  Social  History:  Social History   Substance and Sexual Activity  Alcohol Use Yes     Social History   Substance and Sexual Activity  Drug Use Yes  . Types: Cocaine, Marijuana    Social History   Socioeconomic History  . Marital status: Legally Separated    Spouse name: Not on file  . Number of children: Not on file  . Years of education: Not on file  . Highest education level: Not on file  Occupational History  . Not on file  Social Needs  . Financial resource strain: Not on file  . Food insecurity:    Worry: Not on file    Inability: Not on file  . Transportation needs:    Medical: Not on file    Non-medical: Not on file  Tobacco Use  . Smoking status: Current Every Day Smoker    Packs/day: 0.50    Types: Cigarettes  . Smokeless tobacco: Never Used  Substance and Sexual Activity  . Alcohol use: Yes  . Drug use: Yes    Types: Cocaine, Marijuana  . Sexual activity: Yes    Partners: Male  Lifestyle  . Physical activity:    Days per week: Not on file    Minutes per session: Not on file  . Stress: Not on file  Relationships  . Social connections:    Talks on phone: Not on file    Gets together: Not on file    Attends religious service: Not on file    Active member of club or organization: Not on file    Attends meetings of clubs or organizations: Not on file    Relationship status: Not on file  Other Topics Concern  . Not on file  Social History Narrative  . Not on file   Hospital Course: (Per Md's discharge SRA): [name] is a 51 y/o F with history of schizophrenia and bipolar disorder as well as chronic pain, COPD, DM, HTN, and substance use of multiple substances who presented to [facility] via [facility] due to concern for overdose of temazepam. Pt endorsed chronic pain and wanting to stop the pain in the ED, as well as hallucinations, but she was generally a poor historian. She was placed on IVC, and she was transferred to [facility] for additional treatment and evaluation. Pt  was started on trial of Invega, which she initially took for one dose but then refused. She was also started on previous home medication of lamictal at starting dose. Pt reported improvement of her presenting symptoms during her stay.  Besides the use of Lamictal 25 mg for mood stabilization, [place] was also medicated & discharged on; Nicotine patch 21 mg for smoking cessation, Xanax 0.5 mg prn for anxiety & Trazodone 50 mg for insomnia. She received & was discharged on all her pertinent home medications for the pre-existing medical issues presented. She tolerated her treatment regimen without any adverse effects or reactions reported. She was enrolled & participated in the group counseling sessions being offered & held  on this unit. She learned coping skills.  Today upon evaluation, pt shares, "Things are going fine." She denies any specific complaints. She is sleeping well. Her appetite is good. She denies other physical complaints including exacerbation of her chronic pain. She denies SI/HI/AH/VH. She is tolerating her medication regimen well, and she denies any side effects. She is in agreement to continue her current treatment regimen without changes. She plans to follow up with her previous outpatient provider, and we discussed importance of titration of dose of lamictal and/or starting trial of an additional psychotropic agent, and pt was in agreement to follow up with these topics with her outpatient provider. She was able to engage in safety planning including plan to return to [facility] or contact emergency services if she feels unable to maintain her own safety or the safety of others. Pt had no further questions, comments, or concerns.  Upon discharge, Soha presents mentally & medically stable. She will continue mental health care on an outpatient basis as noted below. She was provided with all the necessary information needed to make this appointment without problems. She left [facility] with all personal  belongings in no apparent distress. Transportation per family.  Physical Findings: AIMS: Facial and Oral Movements Muscles of Facial Expression: None, normal Lips and Perioral Area: None, normal Jaw: None, normal Tongue: None, normal,Extremity Movements Upper (arms, wrists, hands, fingers): None, normal Lower (legs, knees, ankles, toes): None, normal, Trunk Movements Neck, shoulders, hips: None, normal, Overall Severity Severity of abnormal movements (highest score from questions above): None, normal Incapacitation due to abnormal movements: None, normal Patient's awareness of abnormal movements (rate only patient's report): No Awareness, Dental Status Current problems with teeth and/or dentures?: No Does patient usually wear dentures?: No  CIWA:  CIWA-Ar Total: 1 COWS:  COWS Total Score: 2  Musculoskeletal: Strength & Muscle Tone: within normal limits Gait & Station: normal Patient leans: N/A  Psychiatric Specialty Exam: Physical Exam  Constitutional: She appears well-developed.  HENT:  Head: Normocephalic.  Eyes: Pupils are equal, round, and reactive to light.  Neck: Normal range of motion.  Cardiovascular: Normal rate.  Respiratory: Effort normal.  Genitourinary:  Genitourinary Comments: Deferred  Musculoskeletal: Normal range of motion.  Neurological: She is alert.  Skin: Skin is warm.    Review of Systems  HENT: Negative.   Eyes: Negative.   Respiratory: Negative.  Negative for cough and shortness of breath.   Cardiovascular: Negative.  Negative for chest pain and palpitations.  Gastrointestinal: Negative.  Negative for heartburn, nausea and vomiting.  Genitourinary: Negative.   Musculoskeletal: Negative.   Skin: Negative.   Neurological: Negative.   Endo/Heme/Allergies: Negative.   Psychiatric/Behavioral: Positive for depression (Stabilized with medication prior to discharge), hallucinations (Hx. psychosis (Stable)) and substance abuse (Hx. polysubstance use  disorder- stable). Negative for memory loss and suicidal ideas. The patient has insomnia (Stabilized with medication prior to discharge). The patient is not nervous/anxious.     Blood pressure 112/78, pulse 85, temperature 98.9 F (37.2 C), temperature source Oral, resp. rate 18, height 4' 11.84" (1.52 m), weight 58.1 kg (128 lb), last menstrual period 01/08/2011, SpO2 94 %.Body mass index is 25.13 kg/m.  See Md's SRA   Have you used any form of tobacco in the last 30 days? (Cigarettes, Smokeless Tobacco, Cigars, and/or Pipes): Yes  Has this patient used any form of tobacco in the last 30 days? (Cigarettes, Smokeless Tobacco, Cigars, and/or Pipes): Yes, an FDA-approved tobacco cessation medication was offered at discharge.  Blood Alcohol  level:  Lab Results  Component Value Date   ETH <10 11/12/2017   ETH <10 78/29/5621   Metabolic Disorder Labs:  Lab Results  Component Value Date   HGBA1C 7.1 (H) 11/15/2017   MPG 157.07 11/15/2017   Lab Results  Component Value Date   PROLACTIN 32.8 (H) 11/15/2017   Lab Results  Component Value Date   CHOL 173 11/15/2017   TRIG 111 11/15/2017   HDL 54 11/15/2017   CHOLHDL 3.2 11/15/2017   VLDL 22 11/15/2017   LDLCALC 97 11/15/2017   See Psychiatric Specialty Exam and Suicide Risk Assessment completed by Attending Physician prior to discharge.  Discharge destination:  Home  Is patient on multiple antipsychotic therapies at discharge:  No   Has Patient had three or more failed trials of antipsychotic monotherapy by history:  No  Recommended Plan for Multiple Antipsychotic Therapies: NA  Allergies as of 11/16/2017      Reactions   Morphine And Related Nausea And Vomiting, Other (See Comments)   Pt stated she is not allergic to medication   Aspirin Nausea And Vomiting, Other (See Comments)   Ok to take tylenol or ibuprofen      Medication List    STOP taking these medications   fluPHENAZine 5 MG tablet Commonly known as:   PROLIXIN   LATUDA 80 MG Tabs tablet Generic drug:  lurasidone   temazepam 15 MG capsule Commonly known as:  RESTORIL     TAKE these medications     Indication  albuterol 108 (90 Base) MCG/ACT inhaler Commonly known as:  PROAIR HFA Inhale 2 puffs into the lungs every 6 (six) hours as needed for wheezing or shortness of breath.  Indication:  Asthma   ALPRAZolam 0.5 MG tablet Commonly known as:  XANAX Take 1-2 tablets (0.5-1 mg total) by mouth 2 (two) times daily. Take 1 tablet (0.5 mg) in the morning and Take 2 tablets (1 mg) in the afternoon. Do not take at bedtime  Indication:  Feeling Anxious   diclofenac sodium 1 % Gel Commonly known as:  VOLTAREN Apply 4 g topically 4 (four) times daily as needed (for pain). Arthritis What changed:  additional instructions  Indication:  Joint Damage causing Pain and Loss of Function   lamoTRIgine 25 MG tablet Commonly known as:  LAMICTAL Take 1 tablet (25 mg total) by mouth daily. For mood stabilization Start taking on:  11/17/2017 What changed:    when to take this  additional instructions  Indication:  Mood stabilization   lidocaine 5 % Commonly known as:  LIDODERM Place 1 patch onto the skin daily. Remove & Discard patch within 12 hours or as directed by MD: For pain management Start taking on:  11/17/2017  Indication:  Nerve Pain After Herpes Zoster or Shingles   nicotine 21 mg/24hr patch Commonly known as:  NICODERM CQ - dosed in mg/24 hours Place 1 patch (21 mg total) onto the skin daily. (May purchase from over the counter): For nicotine withdrawal symptoms Start taking on:  11/17/2017  Indication:  Nicotine Addiction   pantoprazole 40 MG tablet Commonly known as:  PROTONIX Take 1 tablet (40 mg total) by mouth 2 (two) times daily as needed (indigestion).  Indication:  Stomach Ulcer, Gastroesophageal Reflux Disease   simvastatin 20 MG tablet Commonly known as:  ZOCOR Take 1 tablet (20 mg total) by mouth daily. For high  cholesterol What changed:  additional instructions  Indication:  Inherited Homozygous Hypercholesterolemia, High Amount of Fats in the  Blood   SYMBICORT 160-4.5 MCG/ACT inhaler Generic drug:  budesonide-formoterol Inhale 2 puffs into the lungs daily as needed (sob and wheezing).  Indication:  Asthma, Chronic Obstructive Lung Disease   tiotropium 18 MCG inhalation capsule Commonly known as:  SPIRIVA HANDIHALER Place 1 capsule (18 mcg total) into inhaler and inhale daily. For COPD Start taking on:  11/17/2017 What changed:    how much to take  when to take this  reasons to take this  additional instructions  Indication:  Chronic Obstructive Lung Disease   traZODone 50 MG tablet Commonly known as:  DESYREL Take 1 tablet (50 mg total) by mouth at bedtime as needed for sleep.  Indication:  [place] Follow up on 11/18/2017.   Why:  Wednesday at 3:30 with Dr Laure Kidney. Contact information: [place] [address] [place] [address] [phone]          Follow-up recommendations: Activity:  As tolerated Diet: As recommended by your primary care doctor. Keep all scheduled follow-up appointments as recommended.   Comments: Patient is instructed prior to discharge to: Take all medications as prescribed by his/her mental healthcare provider. Report any adverse effects and or reactions from the medicines to his/her outpatient provider promptly. Patient has been instructed & cautioned: To not engage in alcohol and or illegal drug use while on prescription medicines. In the event of worsening symptoms, patient is instructed to call the crisis hotline, 911 and or go to the nearest ED for appropriate evaluation and treatment of symptoms. To follow-up with his/her primary care provider for your other medical issues, concerns and or health care needs.   Signed: Lindell Spar, NP, PMHNP, FNP-BC 11/16/2017, 9:41 AM    Patient seen, Suicide Assessment Completed.  Disposition Plan Reviewed

## 2017-11-16 NOTE — Progress Notes (Signed)
D:  Patient's self inventory sheet, patient has fair sleep, sleep medication not helpful.  Fair appetite, normal energy level, good concentration.  Rated depression and hopeless 2, denied anxiety.  Denied withdrawals.  Denied SI.  Physical problems, back and knee.  Physical pain, worst pain #8 in past 24 hours.  Goal is discharge.  Plans to talk to MD/SW.  No discharge plans. A:  Medications administered per MD orders.  Emotional support and encouragement given patient. R:  Patient denied SI and HI, contracts for safety.  Denied A/V hallucinations.  Safety maintained with 15 minute checks. Patient looking forward to discharge.

## 2017-11-16 NOTE — Progress Notes (Signed)
  [facility] Adult Case Management Discharge Plan :  Will you be returning to the same living situation after discharge:  Yes,  home At discharge, do you have transportation home?: Yes,  family Do you have the ability to pay for your medications: Yes,  MCR  Release of information consent forms completed and in the chart;  Patient's signature needed at discharge.  Patient to Follow up at: Follow-up Information    [facility] Follow up on 11/18/2017.   Why:  Wednesday at 3:30 with Dr Laure Kidney. Contact information: [address] [place]  [address] [phone]           Next level of care provider has access to [place] and Suicide Prevention discussed: Yes,  yes  Have you used any form of tobacco in the last 30 days? (Cigarettes, Smokeless Tobacco, Cigars, and/or Pipes): Yes  Has patient been referred to the Quitline?: Patient refused referral  Patient has been referred for addiction treatment: Pt. refused referral  Trish Mage, LCSW 11/16/2017, 9:34 AM

## 2017-11-29 ENCOUNTER — Emergency Department (HOSPITAL_COMMUNITY)
Admission: EM | Admit: 2017-11-29 | Discharge: 2017-11-29 | Disposition: A | Discharge status: Home / Self Care | Payer: Medicare Other | Attending: Emergency Medicine | Admitting: Emergency Medicine

## 2017-11-29 ENCOUNTER — Encounter (HOSPITAL_COMMUNITY): Payer: Self-pay | Admitting: Emergency Medicine

## 2017-11-29 DIAGNOSIS — I10 Essential (primary) hypertension: Secondary | ICD-10-CM | POA: Diagnosis not present

## 2017-11-29 DIAGNOSIS — Z760 Encounter for issue of repeat prescription: Secondary | ICD-10-CM | POA: Diagnosis not present

## 2017-11-29 DIAGNOSIS — J449 Chronic obstructive pulmonary disease, unspecified: Secondary | ICD-10-CM | POA: Diagnosis not present

## 2017-11-29 DIAGNOSIS — E039 Hypothyroidism, unspecified: Secondary | ICD-10-CM | POA: Insufficient documentation

## 2017-11-29 DIAGNOSIS — Z79899 Other long term (current) drug therapy: Secondary | ICD-10-CM | POA: Insufficient documentation

## 2017-11-29 DIAGNOSIS — E119 Type 2 diabetes mellitus without complications: Secondary | ICD-10-CM | POA: Diagnosis not present

## 2017-11-29 DIAGNOSIS — F17210 Nicotine dependence, cigarettes, uncomplicated: Secondary | ICD-10-CM | POA: Insufficient documentation

## 2017-11-29 DIAGNOSIS — F250 Schizoaffective disorder, bipolar type: Secondary | ICD-10-CM | POA: Insufficient documentation

## 2017-11-29 MED ORDER — ARIPIPRAZOLE 10 MG PO TABS: 10.0000 mg | ORAL_TABLET | Freq: Every day | ORAL | 0 refills | Status: DC

## 2017-11-29 NOTE — ED Provider Notes (Signed)
[place] DEPT Provider Note   CSN: [id number] Arrival date & time: 11/29/17  1047     History   Chief Complaint Chief Complaint  Patient presents with  . without mental health medications    HPI [name] is a 52 y.o. female.  HPI Patient is a 52 year old female who is requesting a refill of her Abilify.  She states that her doctor wrote her 10 mg twice daily Abilify.  She states that her insurance company will not pay for this and therefore she cannot get the prescription filled at [facility].  In order to get the medication twice a day she will need a preauthorization.  She is unable to get this paperwork filled out by her primary care physician.  She is requesting a refill at least of her Abilify 10 mg once a day.  No other complaints.   Past Medical History:  Diagnosis Date  . Abdominal pain   . Accidental drug overdose April 2013  . Anxiety   . Atrial fibrillation ([place]) 09/29/11   converted spontaneously  . Chronic back pain   . Chronic knee pain   . Chronic nausea   . Chronic pain   . COPD (chronic obstructive pulmonary disease) ([place])   . Depression   . Diabetes mellitus    states her doctor took her off all DM meds in past month  . Diabetic neuropathy ([place])   . Dyspnea    with exertion   . GERD (gastroesophageal reflux disease)   . Headache(784.0)    migraines   . HTN (hypertension)    not on meds since in a year   . Hyperlipidemia   . Hypothyroidism    not on meds in a while   . Mental disorder    Bipolar and schizophrenic  . Requires supplemental oxygen    as needed per patient   . Schizophrenia ([place])   . Schizophrenia, acute ([place]) 11/13/2017  . Tobacco abuse     Patient Active Problem List   Diagnosis Date Noted  . Schizoaffective disorder, bipolar type ([place]) 11/13/2017  . Polysubstance abuse ([place]) 11/13/2017  . 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 Abdominal pain  04/18/2016  . Dehydration 04/17/2016  . Morbid obesity ([place]) 03/25/2016  . Type 2 diabetes mellitus with diabetic polyneuropathy, with long-term current use of insulin ([place]) 09/06/2015  . Vaginitis and vulvovaginitis, unspecified 04/19/2013  . BV (bacterial vaginosis) 04/19/2013  . HTN (hypertension) 11/04/2011  . Hypokalemia 10/02/2011  . atrial fibrillation 09/30/2011  . Acute respiratory failure ([place]) 09/29/2011  . COPD (chronic obstructive pulmonary disease) ([place]) 09/29/2011  . Accidental drug overdose 09/29/2011    Past Surgical History:  Procedure Laterality Date  . ABDOMINAL HYSTERECTOMY    . BLADDER SUSPENSION  03/04/2011   Procedure: [facility] PROCEDURE;  Surgeon: Elayne Snare MacDiarmid;  Location: [place] [facility];  Service: Urology;  Laterality: N/A;  . CYSTOCELE REPAIR  03/04/2011   Procedure: ANTERIOR REPAIR (CYSTOCELE);  Surgeon: Reece Packer;  Location: [place] [facility];  Service: Urology;  Laterality: N/A;  . CYSTOSCOPY  03/04/2011   Procedure: CYSTOSCOPY;  Surgeon: Elayne Snare MacDiarmid;  Location: [place] [facility];  Service: Urology;  Laterality: N/A;  . ESOPHAGOGASTRODUODENOSCOPY (EGD) WITH PROPOFOL N/A 05/12/2017   Procedure: ESOPHAGOGASTRODUODENOSCOPY (EGD) WITH PROPOFOL;  Surgeon: Alphonsa Overall, MD;  [facility];  Service: General;  Laterality: N/A;  . GASTRIC ROUX-EN-Y N/A 03/25/2016   Procedure: LAPAROSCOPIC ROUX-EN-Y GASTRIC BYPASS WITH UPPER ENDOSCOPY;  Surgeon: Excell Seltzer, MD;  Location: [facility];  Service: General;  Laterality: N/A;  . KNEE SURGERY    . LAPAROSCOPIC ASSISTED VAGINAL HYSTERECTOMY  03/04/2011   Procedure: LAPAROSCOPIC ASSISTED VAGINAL HYSTERECTOMY;  Surgeon: Cyril Mourning, MD;  Location: [place] [facility];  Service: Gynecology;  Laterality: N/A;     OB History    Gravida  3   Para  3   Term      Preterm      AB  0   Living  3     SAB  0   TAB  0   Ectopic  0   Multiple  0   Live Births               Home Medications    Prior to Admission  medications   Medication Sig Start Date End Date Taking? Authorizing Provider  albuterol (PROAIR HFA) 108 (90 Base) MCG/ACT inhaler Inhale 2 puffs into the lungs every 6 (six) hours as needed for wheezing or shortness of breath. 11/16/17   Lindell Spar I, NP  ALPRAZolam Duanne Moron) 0.5 MG tablet Take 1-2 tablets (0.5-1 mg total) by mouth 2 (two) times daily. Take 1 tablet (0.5 mg) in the morning and Take 2 tablets (1 mg) in the afternoon. Do not take at bedtime 11/16/17   Lindell Spar I, NP  ARIPiprazole (ABILIFY) 10 MG tablet Take 1 tablet (10 mg total) by mouth daily. 11/29/17   Jola Schmidt, MD  diclofenac sodium (VOLTAREN) 1 % GEL Apply 4 g topically 4 (four) times daily as needed (for pain). Arthritis 11/16/17   Lindell Spar I, NP  lamoTRIgine (LAMICTAL) 25 MG tablet Take 1 tablet (25 mg total) by mouth daily. For mood stabilization 11/17/17   [place], Herbert Pun I, NP  lidocaine (LIDODERM) 5 % Place 1 patch onto the skin daily. Remove & Discard patch within 12 hours or as directed by MD: For pain management 11/17/17   Lindell Spar I, NP  nicotine (NICODERM CQ - DOSED IN MG/24 HOURS) 21 mg/24hr patch Place 1 patch (21 mg total) onto the skin daily. (May purchase from over the counter): For nicotine withdrawal symptoms 11/17/17   Lindell Spar I, NP  pantoprazole (PROTONIX) 40 MG tablet Take 1 tablet (40 mg total) by mouth 2 (two) times daily as needed (indigestion). 11/16/17   Lindell Spar I, NP  simvastatin (ZOCOR) 20 MG tablet Take 1 tablet (20 mg total) by mouth daily. For high cholesterol 11/16/17   Lindell Spar I, NP  SYMBICORT 160-4.5 MCG/ACT inhaler Inhale 2 puffs into the lungs daily as needed (sob and wheezing). 11/16/17   Lindell Spar I, NP  tiotropium (SPIRIVA HANDIHALER) 18 MCG inhalation capsule Place 1 capsule (18 mcg total) into inhaler and inhale daily. For COPD 11/17/17   Lindell Spar I, NP  traZODone (DESYREL) 50 MG tablet Take 1 tablet (50 mg total) by mouth at bedtime as needed for sleep. 11/16/17    Encarnacion Slates, NP    Family History Family History  Problem Relation Age of Onset  . Heart attack Father        58s  . Diabetes Mother   . Heart disease Mother   . Hypertension Mother   . Heart attack Sister        68  . COPD Other   . Breast cancer Neg Hx     Social History Social History   Tobacco Use  . Smoking status: Current Every Day Smoker  Packs/day: 0.50    Types: Cigarettes  . Smokeless tobacco: Never Used  Substance Use Topics  . Alcohol use: Yes  . Drug use: Yes    Types: Cocaine, Marijuana     Allergies   Morphine and related and Aspirin   Review of Systems Review of Systems  All other systems reviewed and are negative.    Physical Exam Updated Vital Signs BP (!) 144/90   Pulse 89   Temp 98.2 F (36.8 C) (Oral)   Resp 18   LMP 01/08/2011   SpO2 98%   Physical Exam  Constitutional: She is oriented to person, place, and time. She appears well-developed and well-nourished.  HENT:  Head: Normocephalic.  Eyes: EOM are normal.  Neck: Normal range of motion.  Pulmonary/Chest: Effort normal.  Abdominal: She exhibits no distension.  Musculoskeletal: Normal range of motion.  Neurological: She is alert and oriented to person, place, and time.  Psychiatric: She has a normal mood and affect.  Nursing note and vitals reviewed.    ED Treatments / Results  Labs (all labs ordered are listed, but only abnormal results are displayed) Labs Reviewed - No data to display  EKG None  Radiology No results found.  Procedures Procedures (including critical care time)  Medications Ordered in ED Medications - No data to display   Initial Impression / Assessment and Plan / ED Course  I have reviewed the triage vital signs and the nursing notes.  Pertinent labs & imaging results that were available during my care of the patient were reviewed by me and considered in my medical decision making (see chart for details).     Well-appearing.   Stable from a mental health standpoint.  No indication for IVC or admission.  Prescription for Abilify written for.  She will need to follow-up with her primary care physician regarding appropriate prescription and pre-authorizations  Final Clinical Impressions(s) / ED Diagnoses   Final diagnoses:  Medication refill    ED Discharge Orders        Ordered    ARIPiprazole (ABILIFY) 10 MG tablet  Daily     11/29/17 1110       Jola Schmidt, MD 11/29/17 1112

## 2017-11-29 NOTE — ED Triage Notes (Signed)
Pt reports that she got out of [facility] last week and saw her therapist who started pt on Abilify but hasnt been able to get it filled due to needing authorization and not being done after sending request to therapist.  Reports MD wrote prescription for twice a day and since typically scheduled once a day is to why needing authorization. Pt wanting samples or some help to get medication filled as she has been without her mental health meds for over week now.

## 2017-11-29 NOTE — ED Notes (Signed)
Bed: [facility] Expected date:  Expected time:  Means of arrival:  Comments: 

## 2018-08-03 ENCOUNTER — Encounter (HOSPITAL_COMMUNITY): Payer: Self-pay | Admitting: *Deleted

## 2018-08-03 ENCOUNTER — Emergency Department (HOSPITAL_COMMUNITY)
Admission: EM | Admit: 2018-08-03 | Discharge: 2018-08-03 | Disposition: A | Discharge status: Other Acute Inpatient Hospital | Payer: Medicare Other | Source: Home / Self Care | Attending: Emergency Medicine | Admitting: Emergency Medicine

## 2018-08-03 ENCOUNTER — Other Ambulatory Visit: Payer: Self-pay

## 2018-08-03 ENCOUNTER — Ambulatory Visit (HOSPITAL_COMMUNITY)
Admission: RE | Admit: 2018-08-03 | Discharge: 2018-08-03 | Disposition: A | Discharge status: Home / Self Care | Payer: Medicare Other | Source: Home / Self Care | Attending: Psychiatry | Admitting: Psychiatry

## 2018-08-03 ENCOUNTER — Inpatient Hospital Stay (HOSPITAL_COMMUNITY)
Admission: AD | Admit: 2018-08-03 | Discharge: 2018-08-06 | DRG: 897 | Disposition: A | Discharge status: Home / Self Care | Payer: Medicare Other | Source: Intra-hospital | Attending: Psychiatry | Admitting: Psychiatry

## 2018-08-03 DIAGNOSIS — Z886 Allergy status to analgesic agent status: Secondary | ICD-10-CM

## 2018-08-03 DIAGNOSIS — F1420 Cocaine dependence, uncomplicated: Secondary | ICD-10-CM | POA: Insufficient documentation

## 2018-08-03 DIAGNOSIS — J449 Chronic obstructive pulmonary disease, unspecified: Secondary | ICD-10-CM | POA: Diagnosis present

## 2018-08-03 DIAGNOSIS — F17210 Nicotine dependence, cigarettes, uncomplicated: Secondary | ICD-10-CM

## 2018-08-03 DIAGNOSIS — E785 Hyperlipidemia, unspecified: Secondary | ICD-10-CM | POA: Insufficient documentation

## 2018-08-03 DIAGNOSIS — Z794 Long term (current) use of insulin: Secondary | ICD-10-CM

## 2018-08-03 DIAGNOSIS — F419 Anxiety disorder, unspecified: Secondary | ICD-10-CM

## 2018-08-03 DIAGNOSIS — I10 Essential (primary) hypertension: Secondary | ICD-10-CM | POA: Diagnosis present

## 2018-08-03 DIAGNOSIS — F209 Schizophrenia, unspecified: Secondary | ICD-10-CM | POA: Insufficient documentation

## 2018-08-03 DIAGNOSIS — F1924 Other psychoactive substance dependence with psychoactive substance-induced mood disorder: Secondary | ICD-10-CM | POA: Diagnosis not present

## 2018-08-03 DIAGNOSIS — F259 Schizoaffective disorder, unspecified: Secondary | ICD-10-CM | POA: Diagnosis present

## 2018-08-03 DIAGNOSIS — E039 Hypothyroidism, unspecified: Secondary | ICD-10-CM

## 2018-08-03 DIAGNOSIS — E119 Type 2 diabetes mellitus without complications: Secondary | ICD-10-CM

## 2018-08-03 DIAGNOSIS — Z833 Family history of diabetes mellitus: Secondary | ICD-10-CM | POA: Diagnosis not present

## 2018-08-03 DIAGNOSIS — Z9884 Bariatric surgery status: Secondary | ICD-10-CM

## 2018-08-03 DIAGNOSIS — Z885 Allergy status to narcotic agent status: Secondary | ICD-10-CM

## 2018-08-03 DIAGNOSIS — Z79899 Other long term (current) drug therapy: Secondary | ICD-10-CM

## 2018-08-03 DIAGNOSIS — F1010 Alcohol abuse, uncomplicated: Secondary | ICD-10-CM

## 2018-08-03 DIAGNOSIS — Z8249 Family history of ischemic heart disease and other diseases of the circulatory system: Secondary | ICD-10-CM

## 2018-08-03 DIAGNOSIS — Z915 Personal history of self-harm: Secondary | ICD-10-CM

## 2018-08-03 DIAGNOSIS — G4700 Insomnia, unspecified: Secondary | ICD-10-CM | POA: Diagnosis present

## 2018-08-03 DIAGNOSIS — K219 Gastro-esophageal reflux disease without esophagitis: Secondary | ICD-10-CM | POA: Diagnosis present

## 2018-08-03 DIAGNOSIS — G8929 Other chronic pain: Secondary | ICD-10-CM | POA: Diagnosis present

## 2018-08-03 DIAGNOSIS — F250 Schizoaffective disorder, bipolar type: Secondary | ICD-10-CM | POA: Diagnosis present

## 2018-08-03 DIAGNOSIS — F1020 Alcohol dependence, uncomplicated: Secondary | ICD-10-CM

## 2018-08-03 DIAGNOSIS — F1410 Cocaine abuse, uncomplicated: Secondary | ICD-10-CM | POA: Insufficient documentation

## 2018-08-03 DIAGNOSIS — Z720 Tobacco use: Secondary | ICD-10-CM

## 2018-08-03 DIAGNOSIS — F1920 Other psychoactive substance dependence, uncomplicated: Secondary | ICD-10-CM

## 2018-08-03 DIAGNOSIS — E1142 Type 2 diabetes mellitus with diabetic polyneuropathy: Secondary | ICD-10-CM | POA: Diagnosis present

## 2018-08-03 DIAGNOSIS — F1994 Other psychoactive substance use, unspecified with psychoactive substance-induced mood disorder: Secondary | ICD-10-CM | POA: Diagnosis present

## 2018-08-03 LAB — ETHANOL: Alcohol, Ethyl (B): <10 mg/dL (ref <10)

## 2018-08-03 LAB — COMPREHENSIVE METABOLIC PANEL
ALT: 18 U/L (ref 0–44)
AST: 23 U/L (ref 15–41)
Albumin: 4.5 g/dL (ref 3.5–5.0)
Alkaline Phosphatase: 62 U/L (ref 38–126)
Anion gap: 9 (ref 5–15)
BUN: 8 mg/dL (ref 6–20)
CHLORIDE: 103 mmol/L (ref 98–111)
CO2: 28 mmol/L (ref 22–32)
CREATININE: 0.58 mg/dL (ref 0.44–1.00)
Calcium: 9.2 mg/dL (ref 8.9–10.3)
GFR calc Af Amer: >60 mL/min (ref >60)
GFR calc non Af Amer: >60 mL/min (ref >60)
Glucose, Bld: 132 mg/dL — ABNORMAL HIGH (ref 70–99)
Potassium: 3.7 mmol/L (ref 3.5–5.1)
Sodium: 140 mmol/L (ref 135–145)
Total Bilirubin: 0.6 mg/dL (ref 0.3–1.2)
Total Protein: 7.8 g/dL (ref 6.5–8.1)

## 2018-08-03 LAB — CBC
HCT: 43.1 % (ref 36.0–46.0)
Hemoglobin: 13.5 g/dL (ref 12.0–15.0)
MCH: 26.6 pg (ref 26.0–34.0)
MCHC: 31.3 g/dL (ref 30.0–36.0)
MCV: 84.8 fL (ref 80.0–100.0)
NRBC: 0 % (ref 0.0–0.2)
Platelets: 315 10*3/uL (ref 150–400)
RBC: 5.08 MIL/uL (ref 3.87–5.11)
RDW: 15.8 % — ABNORMAL HIGH (ref 11.5–15.5)
WBC: 7.3 10*3/uL (ref 4.0–10.5)

## 2018-08-03 LAB — RAPID URINE DRUG SCREEN, HOSP PERFORMED
Amphetamines: NOT DETECTED
Barbiturates: NOT DETECTED
Benzodiazepines: POSITIVE — AB
COCAINE: POSITIVE — AB
OPIATES: NOT DETECTED
Tetrahydrocannabinol: POSITIVE — AB

## 2018-08-03 LAB — SALICYLATE LEVEL: Salicylate Lvl: <7 mg/dL (ref 2.8–30.0)

## 2018-08-03 LAB — ACETAMINOPHEN LEVEL: Acetaminophen (Tylenol), Serum: <10 ug/mL — ABNORMAL LOW (ref 10–30)

## 2018-08-03 MED ORDER — CHLORDIAZEPOXIDE HCL 25 MG PO CAPS
100.0000 mg | ORAL_CAPSULE | Freq: Once | ORAL | Status: AC
  Administered 2018-08-03: 100 mg via ORAL
  Filled 2018-08-03: qty 4

## 2018-08-03 MED ORDER — ONDANSETRON 4 MG PO TBDP: 4.0000 mg | ORAL_TABLET | Freq: Four times a day (QID) | ORAL | Status: DC | PRN

## 2018-08-03 MED ORDER — ACETAMINOPHEN 325 MG PO TABS
650.0000 mg | ORAL_TABLET | Freq: Four times a day (QID) | ORAL | Status: DC | PRN
  Administered 2018-08-04 – 2018-08-06 (×5): 650 mg via ORAL
  Filled 2018-08-03 (×6): qty 2

## 2018-08-03 MED ORDER — DICYCLOMINE HCL 20 MG PO TABS: 20.0000 mg | ORAL_TABLET | Freq: Four times a day (QID) | ORAL | Status: DC | PRN

## 2018-08-03 MED ORDER — CLONIDINE HCL 0.1 MG PO TABS: 0.1000 mg | ORAL_TABLET | ORAL | Status: DC

## 2018-08-03 MED ORDER — NICOTINE 21 MG/24HR TD PT24
21.0000 mg | MEDICATED_PATCH | Freq: Every day | TRANSDERMAL | Status: DC
  Administered 2018-08-03: 21 mg via TRANSDERMAL
  Filled 2018-08-03: qty 1

## 2018-08-03 MED ORDER — MAGNESIUM HYDROXIDE 400 MG/5ML PO SUSP: 30.0000 mL | Freq: Every day | ORAL | Status: DC | PRN

## 2018-08-03 MED ORDER — LORAZEPAM 1 MG PO TABS: 0.0000 mg | ORAL_TABLET | Freq: Two times a day (BID) | ORAL | Status: DC

## 2018-08-03 MED ORDER — ALUM & MAG HYDROXIDE-SIMETH 200-200-20 MG/5ML PO SUSP: 30.0000 mL | ORAL | Status: DC | PRN

## 2018-08-03 MED ORDER — LORAZEPAM 2 MG/ML IJ SOLN: 0.0000 mg | Freq: Four times a day (QID) | INTRAMUSCULAR | Status: DC

## 2018-08-03 MED ORDER — CLONIDINE HCL 0.1 MG PO TABS: 0.1000 mg | ORAL_TABLET | Freq: Every day | ORAL | Status: DC

## 2018-08-03 MED ORDER — TRAZODONE HCL 100 MG PO TABS
100.0000 mg | ORAL_TABLET | Freq: Every evening | ORAL | Status: DC | PRN
  Administered 2018-08-04 – 2018-08-05 (×2): 100 mg via ORAL
  Filled 2018-08-03 (×5): qty 1

## 2018-08-03 MED ORDER — LORAZEPAM 2 MG/ML IJ SOLN: 0.0000 mg | Freq: Two times a day (BID) | INTRAMUSCULAR | Status: DC

## 2018-08-03 MED ORDER — VITAMIN B-1 100 MG PO TABS
100.0000 mg | ORAL_TABLET | Freq: Every day | ORAL | Status: DC
  Administered 2018-08-03: 100 mg via ORAL
  Filled 2018-08-03: qty 1

## 2018-08-03 MED ORDER — HYDROXYZINE HCL 25 MG PO TABS
25.0000 mg | ORAL_TABLET | Freq: Four times a day (QID) | ORAL | Status: DC | PRN
  Administered 2018-08-04 (×2): 25 mg via ORAL
  Filled 2018-08-03: qty 1

## 2018-08-03 MED ORDER — THIAMINE HCL 100 MG/ML IJ SOLN: 100.0000 mg | Freq: Every day | INTRAMUSCULAR | Status: DC

## 2018-08-03 MED ORDER — LORAZEPAM 1 MG PO TABS
0.0000 mg | ORAL_TABLET | Freq: Four times a day (QID) | ORAL | Status: DC
  Administered 2018-08-03 (×2): 1 mg via ORAL
  Filled 2018-08-03 (×2): qty 1

## 2018-08-03 MED ORDER — METHOCARBAMOL 500 MG PO TABS
500.0000 mg | ORAL_TABLET | Freq: Three times a day (TID) | ORAL | Status: DC | PRN
  Administered 2018-08-05 – 2018-08-06 (×4): 500 mg via ORAL
  Filled 2018-08-03 (×4): qty 1

## 2018-08-03 MED ORDER — LOPERAMIDE HCL 2 MG PO CAPS: 2.0000 mg | ORAL_CAPSULE | ORAL | Status: DC | PRN

## 2018-08-03 MED ORDER — CLONIDINE HCL 0.1 MG PO TABS
0.1000 mg | ORAL_TABLET | Freq: Four times a day (QID) | ORAL | Status: DC
  Filled 2018-08-03 (×3): qty 1

## 2018-08-03 NOTE — ED Notes (Signed)
Report given to [facility] at [facility]. Patient has been accepted to room 304-2 for detox. She signed voluntary and is agreeable to the transfer. She had 1 mg of Ativan for a CIWA of 8 and is tired now. All property returned to her. [place] transported to [facility] vol. She is not reporting SI or HI.

## 2018-08-03 NOTE — ED Provider Notes (Signed)
[place] DEPT Provider Note   CSN: [id number] Arrival date & time: 08/03/18  1416     History   Chief Complaint Chief Complaint  Patient presents with  . detox from cocaine    HPI [name] is a 53 y.o. female.  53 yo F with a chief complaint of wanting rehab from cocaine and alcohol.  She went over to behavioral health and they sent her here.  She denies any medical complaint denies chest pain shortness breath abdominal pain vomiting.  She denies prior medical history other than chronic low back pain.  She also smokes cigarettes and requesting a nicotine patch.  The history is provided by the patient and the spouse.  Illness  Severity:  Mild Onset quality:  Sudden Duration:  1 week Timing:  Constant Progression:  Worsening Chronicity:  New Associated symptoms: no chest pain, no congestion, no fever, no headaches, no myalgias, no nausea, no rhinorrhea, no shortness of breath, no vomiting and no wheezing     Past Medical History:  Diagnosis Date  . Abdominal pain   . Accidental drug overdose April 2013  . Anxiety   . Atrial fibrillation ([place]) 09/29/11   converted spontaneously  . Chronic back pain   . Chronic knee pain   . Chronic nausea   . Chronic pain   . COPD (chronic obstructive pulmonary disease) ([place])   . Depression   . Diabetes mellitus    states her doctor took her off all DM meds in past month  . Diabetic neuropathy ([place])   . Dyspnea    with exertion   . GERD (gastroesophageal reflux disease)   . Headache(784.0)    migraines   . HTN (hypertension)    not on meds since in a year   . Hyperlipidemia   . Hypothyroidism    not on meds in a while   . Mental disorder    Bipolar and schizophrenic  . Requires supplemental oxygen    as needed per patient   . Schizophrenia ([place])   . Schizophrenia, acute ([place]) 11/13/2017  . Tobacco abuse     Patient Active Problem List   Diagnosis Date Noted  . Schizoaffective  disorder, bipolar type ([place]) 11/13/2017  . Polysubstance abuse ([place]) 11/13/2017  . 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 Abdominal pain 04/18/2016  . Dehydration 04/17/2016  . Morbid obesity ([place]) 03/25/2016  . Type 2 diabetes mellitus with diabetic polyneuropathy, with long-term current use of insulin ([place]) 09/06/2015  . Vaginitis and vulvovaginitis, unspecified 04/19/2013  . BV (bacterial vaginosis) 04/19/2013  . HTN (hypertension) 11/04/2011  . Hypokalemia 10/02/2011  . atrial fibrillation 09/30/2011  . Acute respiratory failure ([place]) 09/29/2011  . COPD (chronic obstructive pulmonary disease) ([place]) 09/29/2011  . Accidental drug overdose 09/29/2011    Past Surgical History:  Procedure Laterality Date  . ABDOMINAL HYSTERECTOMY    . BLADDER SUSPENSION  03/04/2011   Procedure: [facility] PROCEDURE;  Surgeon: Elayne Snare MacDiarmid;  Location: [place] [facility];  Service: Urology;  Laterality: N/A;  . CYSTOCELE REPAIR  03/04/2011   Procedure: ANTERIOR REPAIR (CYSTOCELE);  Surgeon: Reece Packer;  Location: [place] [facility];  Service: Urology;  Laterality: N/A;  . CYSTOSCOPY  03/04/2011   Procedure: CYSTOSCOPY;  Surgeon: Elayne Snare MacDiarmid;  Location: [place] [facility];  Service: Urology;  Laterality: N/A;  . ESOPHAGOGASTRODUODENOSCOPY (EGD) WITH PROPOFOL N/A 05/12/2017   Procedure: ESOPHAGOGASTRODUODENOSCOPY (EGD) WITH PROPOFOL;  Surgeon: Alphonsa Overall, MD;  Location: [facility];  Service: General;  Laterality: N/A;  . GASTRIC ROUX-EN-Y N/A 03/25/2016   Procedure: LAPAROSCOPIC ROUX-EN-Y GASTRIC BYPASS WITH UPPER ENDOSCOPY;  Surgeon: Excell Seltzer, MD;  Location: [facility];  Service: General;  Laterality: N/A;  . KNEE SURGERY    . LAPAROSCOPIC ASSISTED VAGINAL HYSTERECTOMY  03/04/2011   Procedure: LAPAROSCOPIC ASSISTED VAGINAL HYSTERECTOMY;  Surgeon: Cyril Mourning, MD;  Location: [place];  Service: Gynecology;  Laterality: N/A;     OB History    Gravida  3   Para  3    Term      Preterm      AB  0   Living  3     SAB  0   TAB  0   Ectopic  0   Multiple  0   Live Births               Home Medications    Prior to Admission medications   Medication Sig Start Date End Date Taking? Authorizing Provider  ABILIFY MAINTENA 400 MG PRSY prefilled syringe Inject 400 mg as directed every 28 (twenty-eight) days. 07/12/18  Yes [provider]  albuterol (PROAIR HFA) 108 (90 Base) MCG/ACT inhaler Inhale 2 puffs into the lungs every 6 (six) hours as needed for wheezing or shortness of breath. 11/16/17  Yes [place], Herbert Pun I, NP  ALPRAZolam (XANAX) 1 MG tablet Take 1 mg by mouth 3 (three) times daily. 06/29/18  Yes [provider]  diclofenac sodium (VOLTAREN) 1 % GEL Apply 4 g topically 4 (four) times daily as needed (for pain). Arthritis 11/16/17  Yes Lindell Spar I, NP  ipratropium-albuterol (DUONEB) 0.5-2.5 (3) MG/3ML SOLN Take 3 mLs by nebulization every 6 (six) hours as needed for wheezing or shortness of breath. 06/06/18  Yes [provider]  lidocaine (LIDODERM) 5 % Place 1 patch onto the skin daily. Remove & Discard patch within 12 hours or as directed by MD: For pain management 11/17/17  Yes Lindell Spar I, NP  pantoprazole (PROTONIX) 40 MG tablet Take 1 tablet (40 mg total) by mouth 2 (two) times daily as needed (indigestion). 11/16/17  Yes Lindell Spar I, NP  SYMBICORT 160-4.5 MCG/ACT inhaler Inhale 2 puffs into the lungs daily as needed (sob and wheezing). 11/16/17  Yes Lindell Spar I, NP  temazepam (RESTORIL) 15 MG capsule Take 15 mg by mouth at bedtime as needed for sleep. 06/18/18  Yes [provider]  tiotropium (SPIRIVA HANDIHALER) 18 MCG inhalation capsule Place 1 capsule (18 mcg total) into inhaler and inhale daily. For COPD 11/17/17  Yes Nw[place], Herbert Pun I, NP  ALPRAZolam Duanne Moron) 0.5 MG tablet Take 1-2 tablets (0.5-1 mg total) by mouth 2 (two) times daily. Take 1 tablet (0.5 mg) in the morning and Take 2 tablets (1 mg)  in the afternoon. Do not take at bedtime Patient not taking: Reported on 08/03/2018 11/16/17   Lindell Spar I, NP  ARIPiprazole (ABILIFY) 10 MG tablet Take 1 tablet (10 mg total) by mouth daily. Patient not taking: Reported on 08/03/2018 11/29/17   Jola Schmidt, MD  lamoTRIgine (LAMICTAL) 25 MG tablet Take 1 tablet (25 mg total) by mouth daily. For mood stabilization Patient not taking: Reported on 08/03/2018 11/17/17   Lindell Spar I, NP  nicotine (NICODERM CQ - DOSED IN MG/24 HOURS) 21 mg/24hr patch Place 1 patch (21 mg total) onto the skin daily. (May purchase from over the counter): For nicotine withdrawal symptoms Patient not taking: Reported on 08/03/2018 11/17/17  Lindell Spar I, NP  simvastatin (ZOCOR) 20 MG tablet Take 1 tablet (20 mg total) by mouth daily. For high cholesterol Patient not taking: Reported on 08/03/2018 11/16/17   Lindell Spar I, NP  traZODone (DESYREL) 50 MG tablet Take 1 tablet (50 mg total) by mouth at bedtime as needed for sleep. Patient not taking: Reported on 08/03/2018 11/16/17   Encarnacion Slates, NP    Family History Family History  Problem Relation Age of Onset  . Heart attack Father        99s  . Diabetes Mother   . Heart disease Mother   . Hypertension Mother   . Heart attack Sister        2  . COPD Other   . Breast cancer Neg Hx     Social History Social History   Tobacco Use  . Smoking status: Current Every Day Smoker    Packs/day: 0.50    Types: Cigarettes  . Smokeless tobacco: Never Used  Substance Use Topics  . Alcohol use: Yes  . Drug use: Yes    Types: Cocaine, Marijuana     Allergies   Morphine and related and Aspirin   Review of Systems Review of Systems  Constitutional: Negative for chills and fever.  HENT: Negative for congestion and rhinorrhea.   Eyes: Negative for redness and visual disturbance.  Respiratory: Negative for shortness of breath and wheezing.   Cardiovascular: Negative for chest pain and palpitations.   Gastrointestinal: Negative for nausea and vomiting.  Genitourinary: Negative for dysuria and urgency.  Musculoskeletal: Negative for arthralgias and myalgias.  Skin: Negative for pallor and wound.  Neurological: Negative for dizziness and headaches.     Physical Exam Updated Vital Signs BP (!) 142/100 (BP Location: Left Arm)   Pulse (!) 110   Temp 98.5 F (36.9 C) (Oral)   Resp 20   LMP 01/08/2011   SpO2 96%   Physical Exam Vitals signs and nursing note reviewed.  Constitutional:      General: She is not in acute distress.    Appearance: She is well-developed. She is not diaphoretic.  HENT:     Head: Normocephalic and atraumatic.  Eyes:     Pupils: Pupils are equal, round, and reactive to light.  Neck:     Musculoskeletal: Normal range of motion and neck supple.  Cardiovascular:     Rate and Rhythm: Normal rate and regular rhythm.     Heart sounds: No murmur. No friction rub. No gallop.   Pulmonary:     Effort: Pulmonary effort is normal.     Breath sounds: No wheezing or rales.  Abdominal:     General: There is no distension.     Palpations: Abdomen is soft.     Tenderness: There is no abdominal tenderness.  Musculoskeletal:        General: No tenderness.  Skin:    General: Skin is warm and dry.  Neurological:     Mental Status: She is alert and oriented to person, place, and time.  Psychiatric:        Behavior: Behavior normal.      ED Treatments / Results  Labs (all labs ordered are listed, but only abnormal results are displayed) Labs Reviewed  COMPREHENSIVE METABOLIC PANEL - Abnormal; Notable for the following components:      Result Value   Glucose, Bld 132 (*)    All other components within normal limits  ACETAMINOPHEN LEVEL - Abnormal; Notable for the following components:  Acetaminophen (Tylenol), Serum <10 (*)    All other components within normal limits  CBC - Abnormal; Notable for the following components:   RDW 15.8 (*)    All other  components within normal limits  RAPID URINE DRUG SCREEN, HOSP PERFORMED - Abnormal; Notable for the following components:   Cocaine POSITIVE (*)    Benzodiazepines POSITIVE (*)    Tetrahydrocannabinol POSITIVE (*)    All other components within normal limits  ETHANOL  SALICYLATE LEVEL    EKG None  Radiology No results found.  Procedures Procedures (including critical care time) Discussed smoking cessation with patient and was they were offerred resources to help stop.  Total time was 5 min CPT code 99406.   Medications Ordered in ED Medications  nicotine (NICODERM CQ - dosed in mg/24 hours) patch 21 mg (has no administration in time range)  chlordiazePOXIDE (LIBRIUM) capsule 100 mg (has no administration in time range)  LORazepam (ATIVAN) injection 0-4 mg (has no administration in time range)    Or  LORazepam (ATIVAN) tablet 0-4 mg (has no administration in time range)  LORazepam (ATIVAN) injection 0-4 mg (has no administration in time range)    Or  LORazepam (ATIVAN) tablet 0-4 mg (has no administration in time range)  thiamine (VITAMIN B-1) tablet 100 mg (has no administration in time range)    Or  thiamine (B-1) injection 100 mg (has no administration in time range)     Initial Impression / Assessment and Plan / ED Course  I have reviewed the triage vital signs and the nursing notes.  Pertinent labs & imaging results that were available during my care of the patient were reviewed by me and considered in my medical decision making (see chart for details).     53 yo F with chief complaint of requesting rehab.  Sent from [facility] for medical clearance.  Tylenol and salicylate levels are negative.  Creatinine is at baseline.  Mildly elevated glucose.  CBC without anemia.  We will give the patient Librium started on [place], given a nicotine patch.  I feel the patient is medically clear for transfer.  The patients results and plan were reviewed and discussed.   Any x-rays  performed were independently reviewed by myself.   Differential diagnosis were considered with the presenting HPI.  Medications  nicotine (NICODERM CQ - dosed in mg/24 hours) patch 21 mg (has no administration in time range)  chlordiazePOXIDE (LIBRIUM) capsule 100 mg (has no administration in time range)  LORazepam (ATIVAN) injection 0-4 mg (has no administration in time range)    Or  LORazepam (ATIVAN) tablet 0-4 mg (has no administration in time range)  LORazepam (ATIVAN) injection 0-4 mg (has no administration in time range)    Or  LORazepam (ATIVAN) tablet 0-4 mg (has no administration in time range)  thiamine (VITAMIN B-1) tablet 100 mg (has no administration in time range)    Or  thiamine (B-1) injection 100 mg (has no administration in time range)    Vitals:   08/03/18 1452  BP: (!) 142/100  Pulse: (!) 110  Resp: 20  Temp: 98.5 F (36.9 C)  TempSrc: Oral  SpO2: 96%    Final diagnoses:  Cocaine abuse (HCC)  Alcohol abuse  Nicotine abuse    Admission/ observation were discussed with the admitting physician, patient and/or family and they are comfortable with the plan.    Final Clinical Impressions(s) / ED Diagnoses   Final diagnoses:  Cocaine abuse ([place])  Alcohol abuse  Nicotine abuse    ED Discharge Orders    None       Deno Etienne, DO 08/03/18 1624

## 2018-08-03 NOTE — ED Triage Notes (Addendum)
Patient arrived to ED after first going to [facility] to seek help for her substance abuse problem.  Patient reports she started using crack cocaine about a year ago, but it has "gotten out of hand."  Reports last usage was 10pm last night.  Patient has a history of schizophrenia and bipolar disorder.  Denies SI, HI, or AVH.

## 2018-08-03 NOTE — H&P (Signed)
[place] Screening Exam  [name] is an 53 y.o. female patient presents to [facility] as walk in accompanied by her husband with complaints of daily crack cocaine use and drinking 3-4 40 oz beers daily.  Patient states that she decided to stop and is now going through alcohol withdrawal.  Patient denies prior history of delirium tremor; but states that she is compliant with her medications and it is time for her Abilify injection tomorrow   Total Time spent with patient: 30 minutes  Psychiatric Specialty Exam: Physical Exam  Constitutional: She is oriented to person, place, and time. She appears well-developed.  Neck: Normal range of motion. Neck supple.  Respiratory: Effort normal.  Musculoskeletal: Normal range of motion.  Neurological: She is alert and oriented to person, place, and time.  Skin: Skin is warm and dry.  Psychiatric: Her speech is normal. Her mood appears anxious. She is actively hallucinating. Cognition and memory are normal. She expresses impulsivity. She exhibits a depressed mood.    Review of Systems  Constitutional: Positive for chills and weight loss.  Neurological: Seizures: Denies.  Psychiatric/Behavioral: Positive for depression, hallucinations and suicidal ideas. The patient has insomnia.        Alcohol withdrawal.  History of schizoaffective; hearing voices    Last menstrual period 01/08/2011.There is no height or weight on file to calculate BMI.  General Appearance: Casual  Eye Contact:  Good  Speech:  Clear and Coherent and Normal Rate  Volume:  Normal  Mood:  Anxious, Depressed and Hopeless  Affect:  Depressed  Thought Process:  Coherent and Goal Directed  Orientation:  Full (Time, Place, and Person)  Thought Content:  Hallucinations: Auditory  Suicidal Thoughts:  No  Homicidal Thoughts:  No  Memory:  Immediate;   Good Recent;   Good Remote;   Good  Judgement:  Fair  Insight:  Fair  Psychomotor Activity:  Restlessness   Concentration: Concentration: Fair and Attention Span: Fair  Recall:  Good  Fund of Knowledge:Fair  Language: Good  Akathisia:  No  Handed:  Right  AIMS (if indicated):     Assets:  Communication Skills Desire for Improvement Housing Social Support  Sleep:       Musculoskeletal: Strength & Muscle Tone: within normal limits Gait & Station: normal Patient leans: N/A  Last menstrual period 01/08/2011.  Recommendations: Inpatient psychiatric treatment  Based on my evaluation the patient does not appear to have an emergency medical condition.  Meri Pelot, NP 08/03/2018, 12:33 PM
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
## 2018-08-03 NOTE — ED Notes (Signed)
Ex husband at bedside at the start of my shift, he was asked to leave before 2000 as she is asleep and he has been here since first shift and no benefit from him continuing to stay. She is expecting to transfer to [facility], and writer just received call from [facility] that she has been accepted to room 304-2. She is glad to be going to complete detox.

## 2018-08-03 NOTE — Progress Notes (Signed)
[name] is a 53 year old female pt admitted on voluntary basis. On admission, she reports that she has been abusing beer and crack and reports that she needed to come in and get help. She denied any SI on admission but did endorse recent depression but is able to contract for safety on the unit. She does endorse auditory hallucinations. She reports that she takes her medications as prescribed. She was minimally cooperative with admission and explained she was tired of answering the same questions. She was escorted to the unit, oriented to the milieu and safety maintained.

## 2018-08-03 NOTE — Tx Team (Signed)
Initial Treatment Plan 08/03/2018 11:54 PM [name] CNG:[id number]    PATIENT STRESSORS: Health problems Marital or family conflict Substance abuse   PATIENT STRENGTHS: Ability for insight Average or above average intelligence General fund of knowledge Motivation for treatment/growth   PATIENT IDENTIFIED PROBLEMS: Depression Suicidal thoughts Substance Abuse "I need to get detoxed"                     DISCHARGE CRITERIA:  Ability to meet basic life and health needs Improved stabilization in mood, thinking, and/or behavior Reduction of life-threatening or endangering symptoms to within safe limits Verbal commitment to aftercare and medication compliance Withdrawal symptoms are absent or subacute and managed without 24-hour nursing intervention  PRELIMINARY DISCHARGE PLAN: Attend aftercare/continuing care group Return to previous living arrangement  PATIENT/FAMILY INVOLVEMENT: This treatment plan has been presented to and reviewed with the patient, [name], and/or family member, .  The patient and family have been given the opportunity to ask questions and make suggestions.  [place], [place], [place] 08/03/2018, 11:54 PM

## 2018-08-03 NOTE — ED Notes (Signed)
Walk in at [facility] to ED for medical clearance-has a bed at [facility]

## 2018-08-04 ENCOUNTER — Other Ambulatory Visit: Payer: Self-pay

## 2018-08-04 DIAGNOSIS — F1994 Other psychoactive substance use, unspecified with psychoactive substance-induced mood disorder: Secondary | ICD-10-CM

## 2018-08-04 DIAGNOSIS — F1920 Other psychoactive substance dependence, uncomplicated: Secondary | ICD-10-CM

## 2018-08-04 MED ORDER — GLUCERNA SHAKE PO LIQD
237.0000 mL | Freq: Three times a day (TID) | ORAL | Status: DC
  Administered 2018-08-04: 237 mL via ORAL

## 2018-08-04 MED ORDER — ARIPIPRAZOLE ER 400 MG IM SRER
400.0000 mg | Freq: Once | INTRAMUSCULAR | Status: AC
  Administered 2018-08-04: 400 mg via INTRAMUSCULAR

## 2018-08-04 MED ORDER — NICOTINE 21 MG/24HR TD PT24
21.0000 mg | MEDICATED_PATCH | Freq: Every day | TRANSDERMAL | Status: DC
  Administered 2018-08-04 – 2018-08-06 (×3): 21 mg via TRANSDERMAL
  Filled 2018-08-04 (×4): qty 1

## 2018-08-04 MED ORDER — CHLORDIAZEPOXIDE HCL 25 MG PO CAPS
25.0000 mg | ORAL_CAPSULE | Freq: Four times a day (QID) | ORAL | Status: DC | PRN
  Administered 2018-08-04: 25 mg via ORAL
  Filled 2018-08-04: qty 1

## 2018-08-04 MED ORDER — AMANTADINE HCL 100 MG PO CAPS
100.0000 mg | ORAL_CAPSULE | Freq: Two times a day (BID) | ORAL | Status: DC
  Administered 2018-08-04 – 2018-08-06 (×5): 100 mg via ORAL
  Filled 2018-08-04 (×8): qty 1

## 2018-08-04 MED ORDER — ENSURE ENLIVE PO LIQD: 237.0000 mL | Freq: Two times a day (BID) | ORAL | Status: DC

## 2018-08-04 MED ORDER — ARIPIPRAZOLE ER 400 MG IM SRER
400.0000 mg | Freq: Once | INTRAMUSCULAR | Status: DC
  Filled 2018-08-04: qty 2

## 2018-08-04 MED ORDER — FLUOXETINE HCL 20 MG PO CAPS
20.0000 mg | ORAL_CAPSULE | Freq: Every day | ORAL | Status: DC
  Administered 2018-08-04 – 2018-08-06 (×3): 20 mg via ORAL
  Filled 2018-08-04 (×5): qty 1

## 2018-08-04 NOTE — Progress Notes (Signed)
Patient denies SI, HI and AVH.  Patient was concerned about getting her Abilify injection this shift.  Patient has been isolative to room.  Patient compliant with medications and treatment.   Assess patient for safety, offer medications as prescribed, engage patient in 1:1 staff talks.   Continue to monitor as planned.

## 2018-08-04 NOTE — BHH Suicide Risk Assessment (Signed)
[place] INPATIENT:  Family/Significant Other Suicide Prevention Education  Suicide Prevention Education:  Education Completed; [name], husband, [phone],  has been identified by the patient as the family member/significant other with whom the patient will be residing, and identified as the person(s) who will aid the patient in the event of a mental health crisis (suicidal ideations/suicide attempt).  With written consent from the patient, the family member/significant other has been provided the following suicide prevention education, prior to the and/or following the discharge of the patient.  The suicide prevention education provided includes the following:  Suicide risk factors  Suicide prevention and interventions  National Suicide Hotline telephone number  [facility] assessment telephone number  [facility] Emergency Assistance [place] and/or Residential Mobile Crisis Unit telephone number  Request made of family/significant other to:  Remove weapons (e.g., guns, rifles, knives), all items previously/currently identified as safety concern.  Binnie Kand has stated that all guns have been removed from his home and are at a second home that he owns at a different address. He stated that Kirk does not have access to the second home.  Remove drugs/medications (over-the-counter, prescriptions, illicit drugs), all items previously/currently identified as a safety concern.  The family member/significant other verbalizes understanding of the suicide prevention education information provided.  The family member/significant other agrees to remove the items of safety concern listed above.  Lawana Pai, MSW Intern [place] Department 08/04/2018, 3:56 PM

## 2018-08-04 NOTE — Progress Notes (Signed)
D: Pt denies SI/HI, passive AVH- better . Pt is pleasant and cooperative. Stated she was better today. Pt stated she had hard time sleeping last night due to coughing. Mattress was adjusted to give pt elevation for her head, will continue to monitor.   A: Pt was offered support and encouragement.  Pt was encourage to attend groups. Q 15 minute checks were done for safety.   R:Pt attends groups and interacts well with peers and staff. Pt is taking medication. Pt has no complaints.Pt receptive to treatment and safety maintained on unit.   Problem: Education: Goal: Emotional status will improve Outcome: Progressing   Problem: Education: Goal: Mental status will improve Outcome: Progressing   Problem: Activity: Goal: Interest or engagement in activities will improve Outcome: Progressing   Problem: Activity: Goal: Sleeping patterns will improve Outcome: Progressing

## 2018-08-04 NOTE — Plan of Care (Deleted)
D: Pt denies SI/HI, passive AVH- better . Pt is pleasant and cooperative. Stated she was better today. Pt stated she had hard time sleeping last night due to coughing. Mattress was adjusted to give pt elevation for her head, will continue to monitor.   A: Pt was offered support and encouragement.  Pt was encourage to attend groups. Q 15 minute checks were done for safety.   R:Pt attends groups and interacts well with peers and staff. Pt is taking medication. Pt has no complaints.Pt receptive to treatment and safety maintained on unit.   Problem: Education: Goal: Emotional status will improve Outcome: Progressing   Problem: Education: Goal: Mental status will improve Outcome: Progressing   Problem: Activity: Goal: Interest or engagement in activities will improve Outcome: Progressing   Problem: Activity: Goal: Sleeping patterns will improve Outcome: Progressing

## 2018-08-04 NOTE — Progress Notes (Signed)
Recreation Therapy Notes  INPATIENT RECREATION THERAPY ASSESSMENT  Patient Details Name: MAZZIE BRODRICK MRN: [id number] DOB: [birth year] Today's Date: 08/04/2018       Information Obtained From: Patient  Able to Participate in Assessment/Interview: Yes  Patient Presentation: Alert  Reason for Admission (Per Patient): Substance Abuse(Pt stated she also suffers from bi-polar schizophrenia)  Patient Stressors: (Pt stated she didn't know of any stressors.)  Coping Skills:   Isolation, TV, Arguments, Music, Deep Breathing, Meditate, Substance Abuse, Talk, Prayer, Avoidance, Hot Bath/Shower  Leisure Interests (2+):  Social - Friends, Games - Cards, Individual - Other (Comment)(Get high)  Frequency of Recreation/Participation: Other (Comment)(Pt stated she's not sure.)  Awareness of Community Resources:  Yes  [employer]:  [place]  Current Use: No  If no, Barriers?:    Expressed Interest in [place]: No  [place] of Residence:  [place]  Patient Main Form of Transportation: Musician  Patient Strengths:  Beautiful person; Nice to people  Patient Identified Areas of Improvement:  Get of drugs; Get life back  Patient Goal for Hospitalization:  'Try to be a better person"  Current SI (including self-harm):  No  Current HI:  No  Current AVH: No  Staff Intervention Plan: Group Attendance, Collaborate with Interdisciplinary Treatment Team  Consent to Intern Participation: N/A    Victorino Sparrow, LRT/CTRS  Ria Comment, Jurrell Royster A 08/04/2018, 12:12 PM
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
## 2018-08-04 NOTE — Progress Notes (Signed)
Recreation Therapy Notes  Date: 2.12.20 Time: 1000 Location: 500 Hall Dayroom  Group Topic: Self-Esteem  Goal Area(s) Addresses:  Patient will successfully identify positive attributes about themselves.  Patient will successfully identify benefit of improved self-esteem.   Intervention: Worksheet, Corporate treasurer, markers, peel and stick designs  Activity: Crest of Arms.  Patients were given a crest that was divided into four sections.  In each section, patients were to identify something that makes them unique, something they are proud of, important dates, things they have accomplished or just whatever they are proud of about themselves.  Education:  Self-Esteem, Dentist.   Education Outcome: Acknowledges education/In group clarification offered/Needs additional education  Clinical Observations/Feedback: Pt did not attend group.     Victorino Sparrow, LRT/CTRS         Victorino Sparrow A 08/04/2018 11:46 AM

## 2018-08-04 NOTE — Discharge Instructions (Signed)
[facility] Stay Proper nutrition can help your body recover from illness and injury.   Foods and beverages high in protein, vitamins, and minerals help rebuild muscle loss, promote healing, & reduce fall risk.   In addition to eating healthy foods, a nutrition shake is an easy, delicious way to get the nutrition you need during and after your hospital stay  It is recommended that you continue to drink 2 bottles per day of:       Ensure Max or Premier Protein for at least 1 month (30 days) after your hospital stay   Tips for adding a nutrition shake into your routine: As allowed, drink one with vitamins or medications instead of water or juice Enjoy one as a tasty mid-morning or afternoon snack Drink cold or make a milkshake out of it Drink one instead of milk with cereal or snacks Use as a coffee creamer   Available at the following grocery stores and pharmacies:           * [facility] [phone]            For COUPONS visit: www.ensure.com/join or http://[email or web address]-may.com/   Suggested Substitutions Ensure Plus = Boost Plus = Carnation Breakfast Essentials = Boost Compact Ensure Active Clear = Boost Breeze Glucerna Shake = Boost Glucose Control = Carnation Breakfast Essentials SUGAR FREE Ensure Max = Premier Protein

## 2018-08-04 NOTE — Progress Notes (Signed)
NUTRITION ASSESSMENT  Pt identified as at risk on the Malnutrition Screen Tool  INTERVENTION: 1. Supplements: Glucerna Shake po TID, each supplement provides 220 kcal and 10 grams of protein  NUTRITION DIAGNOSIS: Unintentional weight loss related to sub-optimal intake as evidenced by pt report.   Goal: Pt to meet >/= 90% of their estimated nutrition needs.  Monitor:  PO intake  Assessment:  Pt admitted with polysubstance abuse. Pt reports drinking beer and using crack cocaine. Pt with h/o gastric bypass surgery in 2017. Per-surgery weight was 219 lb. Pt is now 105 lb. Will switch Ensure supplements to Glucerna shakes.   Height: Ht Readings from Last 1 Encounters:  08/03/18 5\' 2"  (1.575 m)    Weight: Wt Readings from Last 1 Encounters:  08/03/18 47.6 kg    Weight Hx: Wt Readings from Last 10 Encounters:  08/03/18 47.6 kg  11/12/17 58.1 kg  11/12/17 59 kg  09/13/17 59 kg  08/10/17 59 kg  06/27/17 64.4 kg  06/13/17 64.4 kg  05/12/17 70.8 kg  10/23/16 71 kg  09/11/16 72.2 kg    BMI:  Body mass index is 19.2 kg/m. Pt meets criteria for normal based on current BMI.  Estimated Nutritional Needs: Kcal: 25-30 kcal/kg Protein: > 1 gram protein/kg Fluid: 1 ml/kcal  Diet Order:  Diet Order            Diet regular Room service appropriate? Yes; Fluid consistency: Thin  Diet effective now             Pt is also offered choice of unit snacks mid-morning and mid-afternoon.  Pt is eating as desired.   Lab results and medications reviewed.   Clayton Bibles, MS, RD, [place] Dietitian Pager: [phone] After Hours Pager: [phone]

## 2018-08-05 MED ORDER — KETOROLAC TROMETHAMINE 60 MG/2ML IM SOLN
60.0000 mg | Freq: Once | INTRAMUSCULAR | Status: AC
  Administered 2018-08-05: 60 mg via INTRAMUSCULAR
  Filled 2018-08-05 (×2): qty 2

## 2018-08-05 MED ORDER — ZOLPIDEM TARTRATE 5 MG PO TABS
5.0000 mg | ORAL_TABLET | Freq: Every evening | ORAL | Status: DC | PRN
  Administered 2018-08-05: 5 mg via ORAL
  Filled 2018-08-05: qty 1

## 2018-08-05 NOTE — Tx Team (Deleted)
Interdisciplinary Treatment Plan Update (Adult)  Date:  08/05/2018 Time Reviewed:  09:05 AM  Progress in Treatment: Attending groups: No. Participating in groups:  No. Taking medication as prescribed:  Yes. Tolerating medication:  Yes. Family/Significant othe contact made:  No, will contact:  [name], husband, [phone] Patient understands diagnosis:  Yes. Discussing patient identified problems/goals with staff:  Yes. Medical problems stabilized or resolved:  No. Denies suicidal/homicidal ideation: Yes. Issues/concerns per patient self-inventory:  No. Other:  New problem(s) identified: No, Describe:  None Identified  Discharge Plan or Barriers:  Reason for Continuation of Hospitalization: Other; describe Polysubstance Abuse (excluding opioids)  Comments:  Estimated length of stay: 2-3 days  New goal(s): "Get off drugs"  Review of initial/current patient goals per problem list:   1.  Goal(s):  Met:  No  Target date:  As evidenced by:  Attendees: Patient: [name] 08/04/2018 3:02 PM  Physician: Johnn Hai, MD 08/04/2018 3:02 PM  Nursing: Elesa Massed, RN 08/04/2018 3:02 PM  RN Care Manager: 08/04/2018 3:02 PM  Social Worker: Lurline Idol, LCSW 08/04/2018 3:02 PM  Recreational Therapist:  08/04/2018 3:02 PM  Other:  08/04/2018 3:02 PM  Other:  08/04/2018 3:02 PM  Other: 08/04/2018 3:02 PM    Scribe for Treatment Team: Lawana Pai, MSW Intern [place] [facility] 08/04/2018 3:02 PM

## 2018-08-05 NOTE — Progress Notes (Signed)
Recreation Therapy Notes  Date: 2.13.20 Time: 1000 Location: 500 Hall Dayroom  Group Topic: Anger Management  Goal Area(s) Addresses:  Patient will identify triggers for anger.  Patient will identify a situation that makes them angry.  Patient will identify what other emotions comes with anger.   Intervention: Worksheet, pencils  Activity:  Introduction to Anger Management.  Patients were to identify three situations/topics or people that lead to anger, what they do when they get angry and what problems they have run into as a result of anger.  Education: Anger Management, Discharge Planning   Education Outcome: Acknowledges education/In group clarification offered/Needs additional education.   Clinical Observations/Feedback: Pt did not attend group.     Victorino Sparrow, LRT/CTRS         Victorino Sparrow A 08/05/2018 11:57 AM

## 2018-08-05 NOTE — Tx Team (Signed)
Interdisciplinary Treatment and Diagnostic Plan Update  08/05/2018 Time of Session: 0905 [name] MRN: [id number]  Principal Diagnosis: Polysubstance (excluding opioids) dependence, daily use ([place])  Secondary Diagnoses: Principal Problem:   Polysubstance (excluding opioids) dependence, daily use ([facility]) Active Problems:   Schizoaffective disorder, bipolar type ([place])   Substance induced mood disorder ([place])   Current Medications:  Current Facility-Administered Medications  Medication Dose Route Frequency Provider Last Rate Last Dose  . acetaminophen (TYLENOL) tablet 650 mg  650 mg [name] Q6H PRN Laverle Hobby, PA-C   650 mg at 08/05/18 5956  . alum & mag hydroxide-simeth (MAALOX/MYLANTA) 200-200-20 MG/5ML suspension 30 mL  30 mL [name] Q4H PRN Patriciaann Clan E, PA-C      . amantadine (SYMMETREL) capsule 100 mg  100 mg [name] BID Johnn Hai, MD   100 mg at 08/05/18 0804  . chlordiazePOXIDE (LIBRIUM) capsule 25 mg  25 mg [name] QID PRN Laverle Hobby, PA-C   25 mg at 08/04/18 1829  . dicyclomine (BENTYL) tablet 20 mg  20 mg [name] Q6H PRN Patriciaann Clan E, PA-C      . feeding supplement (GLUCERNA SHAKE) (GLUCERNA SHAKE) liquid 237 mL  237 mL [name] TID BM Sharma Covert, MD   Stopped at 08/04/18 2305  . FLUoxetine (PROZAC) capsule 20 mg  20 mg [name] Daily Johnn Hai, MD   20 mg at 08/05/18 0804  . hydrOXYzine (ATARAX/VISTARIL) tablet 25 mg  25 mg [name] Q6H PRN Laverle Hobby, PA-C   25 mg at 08/04/18 1829  . loperamide (IMODIUM) capsule 2-4 mg  2-4 mg [name] PRN Patriciaann Clan E, PA-C      . magnesium hydroxide (MILK OF MAGNESIA) suspension 30 mL  30 mL [name] Daily PRN Laverle Hobby, PA-C      . methocarbamol (ROBAXIN) tablet 500 mg  500 mg [name] Q8H PRN Patriciaann Clan E, PA-C   500 mg at 08/05/18 0806  . nicotine (NICODERM CQ - dosed in mg/24 hours) patch 21 mg  21 mg Transdermal Daily Johnn Hai, MD   21 mg at 08/05/18 0803  . ondansetron (ZOFRAN-ODT) disintegrating tablet 4 mg  4 mg  [name] Q6H PRN Laverle Hobby, PA-C      . zolpidem (AMBIEN) tablet 5 mg  5 mg [name] QHS PRN Johnn Hai, MD       PTA Medications: Medications Prior to Admission  Medication Sig Dispense Refill Last Dose  . ABILIFY MAINTENA 400 MG PRSY prefilled syringe Inject 400 mg as directed every 28 (twenty-eight) days.   Next dose due 08/04/2018  . albuterol (PROAIR HFA) 108 (90 Base) MCG/ACT inhaler Inhale 2 puffs into the lungs every 6 (six) hours as needed for wheezing or shortness of breath.   08/02/2018 at Unknown time  . ALPRAZolam (XANAX) 0.5 MG tablet Take 1-2 tablets (0.5-1 mg total) by mouth 2 (two) times daily. Take 1 tablet (0.5 mg) in the morning and Take 2 tablets (1 mg) in the afternoon. Do not take at bedtime (Patient not taking: Reported on 08/03/2018)  0 Not Taking at Unknown time  . ALPRAZolam (XANAX) 1 MG tablet Take 1 mg by mouth 3 (three) times daily.   past week  . ARIPiprazole (ABILIFY) 10 MG tablet Take 1 tablet (10 mg total) by mouth daily. (Patient not taking: Reported on 08/03/2018) 30 tablet 0 Not Taking at Unknown time  . diclofenac sodium (VOLTAREN) 1 % GEL Apply 4 g topically 4 (four) times daily as needed (for  pain). Arthritis   Past Week at Unknown time  . ipratropium-albuterol (DUONEB) 0.5-2.5 (3) MG/3ML SOLN Take 3 mLs by nebulization every 6 (six) hours as needed for wheezing or shortness of breath.   Unknown  . lamoTRIgine (LAMICTAL) 25 MG tablet Take 1 tablet (25 mg total) by mouth daily. For mood stabilization (Patient not taking: Reported on 08/03/2018) 30 tablet 0 Not Taking at Unknown time  . lidocaine (LIDODERM) 5 % Place 1 patch onto the skin daily. Remove & Discard patch within 12 hours or as directed by MD: For pain management 1 patch 0 Past Week at Unknown time  . nicotine (NICODERM CQ - DOSED IN MG/24 HOURS) 21 mg/24hr patch Place 1 patch (21 mg total) onto the skin daily. (May purchase from over the counter): For nicotine withdrawal symptoms (Patient not taking:  Reported on 08/03/2018) 1 patch 0 Not Taking at Unknown time  . pantoprazole (PROTONIX) 40 MG tablet Take 1 tablet (40 mg total) by mouth 2 (two) times daily as needed (indigestion). 1 tablet 0 Unknown  . simvastatin (ZOCOR) 20 MG tablet Take 1 tablet (20 mg total) by mouth daily. For high cholesterol (Patient not taking: Reported on 08/03/2018) 1 tablet 0 Not Taking at Unknown time  . SYMBICORT 160-4.5 MCG/ACT inhaler Inhale 2 puffs into the lungs daily as needed (sob and wheezing). 1 Inhaler 0 Unknown at Unknown time  . temazepam (RESTORIL) 15 MG capsule Take 15 mg by mouth at bedtime as needed for sleep.   yesterday  . tiotropium (SPIRIVA HANDIHALER) 18 MCG inhalation capsule Place 1 capsule (18 mcg total) into inhaler and inhale daily. For COPD 1 capsule 0 Unknown  . traZODone (DESYREL) 50 MG tablet Take 1 tablet (50 mg total) by mouth at bedtime as needed for sleep. (Patient not taking: Reported on 08/03/2018) 30 tablet 0 Not Taking at Unknown time    Patient Stressors: Health problems Marital or family conflict Substance abuse  Patient Strengths: Ability for insight Average or above average intelligence General fund of knowledge Motivation for treatment/growth  Treatment Modalities: Medication Management, Group therapy, Case management,  1 to 1 session with clinician, Psychoeducation, Recreational therapy.   Physician Treatment Plan for Primary Diagnosis: Polysubstance (excluding opioids) dependence, daily use ([place]) Long Term Goal(s): Improvement in symptoms so as ready for discharge Improvement in symptoms so as ready for discharge   Short Term Goals: Ability to identify changes in lifestyle to reduce recurrence of condition will improve Ability to verbalize feelings will improve Ability to demonstrate self-control will improve Ability to identify and develop effective coping behaviors will improve Ability to maintain clinical measurements within normal limits will  improve Compliance with prescribed medications will improve Ability to identify triggers associated with substance abuse/mental health issues will improve  Medication Management: Evaluate patient's response, side effects, and tolerance of medication regimen.  Therapeutic Interventions: 1 to 1 sessions, Unit Group sessions and Medication administration.  Evaluation of Outcomes: Not Met  Physician Treatment Plan for Secondary Diagnosis: Principal Problem:   Polysubstance (excluding opioids) dependence, daily use ([place]) Active Problems:   Schizoaffective disorder, bipolar type ([place])   Substance induced mood disorder ([place])  Long Term Goal(s): Improvement in symptoms so as ready for discharge Improvement in symptoms so as ready for discharge   Short Term Goals: Ability to identify changes in lifestyle to reduce recurrence of condition will improve Ability to verbalize feelings will improve Ability to demonstrate self-control will improve Ability to identify and develop effective coping behaviors will improve Ability  to maintain clinical measurements within normal limits will improve Compliance with prescribed medications will improve Ability to identify triggers associated with substance abuse/mental health issues will improve     Medication Management: Evaluate patient's response, side effects, and tolerance of medication regimen.  Therapeutic Interventions: 1 to 1 sessions, Unit Group sessions and Medication administration.  Evaluation of Outcomes: Not Met   RN Treatment Plan for Primary Diagnosis: Polysubstance (excluding opioids) dependence, daily use ([facility]) Long Term Goal(s): Knowledge of disease and therapeutic regimen to maintain health will improve  Short Term Goals: Ability to identify and develop effective coping behaviors will improve and Compliance with prescribed medications will improve  Medication Management: RN will administer medications as ordered by provider, will  assess and evaluate patient's response and provide education to patient for prescribed medication. RN will report any adverse and/or side effects to prescribing provider.  Therapeutic Interventions: 1 on 1 counseling sessions, Psychoeducation, Medication administration, Evaluate responses to treatment, Monitor vital signs and CBGs as ordered, Perform/monitor CIWA, COWS, AIMS and Fall Risk screenings as ordered, Perform wound care treatments as ordered.  Evaluation of Outcomes: Not Met   LCSW Treatment Plan for Primary Diagnosis: Polysubstance (excluding opioids) dependence, daily use ([place]) Long Term Goal(s): Safe transition to appropriate next level of care at discharge, Engage patient in therapeutic group addressing interpersonal concerns.  Short Term Goals: Engage patient in aftercare planning with referrals and resources, Increase social support and Increase skills for wellness and recovery  Therapeutic Interventions: Assess for all discharge needs, 1 to 1 time with Social worker, Explore available resources and support systems, Assess for adequacy in community support network, Educate family and significant other(s) on suicide prevention, Complete Psychosocial Assessment, Interpersonal group therapy.  Evaluation of Outcomes: Not Met   Progress in Treatment: Attending groups: No. Participating in groups: No. Taking medication as prescribed: Yes. Toleration medication: Yes. Family/Significant other contact made: Yes, individual(s) contacted:  husband Patient understands diagnosis: Yes. Discussing patient identified problems/goals with staff: Yes. Medical problems stabilized or resolved: Yes. Denies suicidal/homicidal ideation: Yes. Issues/concerns per patient self-inventory: No. Other: none  New problem(s) identified: No, Describe:  none  New Short Term/Long Term Goal(s):  Patient Goals:  Stop using drugs  Discharge Plan or Barriers:   Reason for Continuation of  Hospitalization: Hallucinations Medication stabilization Withdrawal symptoms  Estimated Length of Stay: 3-5 days.    Attendees: Patient:[name] 08/04/18  Physician: Dr Jake Samples, MD 08/04/18  Nursing: Elesa Massed, RN 08/04/18  RN Care Manager:   Social Worker: Lurline Idol, LCSW 08/04/18  Recreational Therapist:    Other: Kerby Nora, MSW intern 08/04/18  Other:    Other:     Scribe for Treatment Team: Joanne Chars, [place], [place] 08/05/2018 12:39 PM

## 2018-08-05 NOTE — Tx Team (Deleted)
Interdisciplinary Treatment Plan Update (Adult)  Date:  08/05/2018 Time Reviewed:  09:05 AM  Progress in Treatment: Attending groups: No. Participating in groups:  No. Taking medication as prescribed:  Yes. Tolerating medication:  Yes. Family/Significant othe contact made:  No, will contact:  [name], husband, [phone] Patient understands diagnosis:  Yes. Discussing patient identified problems/goals with staff:  Yes. Medical problems stabilized or resolved:  No. Denies suicidal/homicidal ideation: Yes. Issues/concerns per patient self-inventory:  No. Other:  New problem(s) identified: No, Describe:  None Identified  Discharge Plan or Barriers:  Reason for Continuation of Hospitalization: Other; describe Polysubstance Abuse (excluding opioids)  Comments:  Estimated length of stay: 2-3 days  New goal(s): "Get off drugs"   Attendees: Patient: [name] 08/04/2018 3:02 PM  Physician: Johnn Hai, MD 08/04/2018 3:02 PM  Nursing: Elesa Massed, RN 08/04/2018 3:02 PM  RN Care Manager: 08/04/2018 3:02 PM  Social Worker: Lurline Idol, [place] 08/04/2018 3:02 PM  Recreational Therapist:  08/04/2018 3:02 PM  Other:  08/04/2018 3:02 PM  Other:  08/04/2018 3:02 PM  Other: 08/04/2018 3:02 PM    Scribe for Treatment Team: Lawana Pai, MSW Intern [place] Department 08/04/2018 3:02 PM

## 2018-08-05 NOTE — Tx Team (Deleted)
Interdisciplinary Treatment Plan Update (Adult)  Date:  08/05/2018 Time Reviewed:  09:05 AM  Progress in Treatment: Attending groups: No. Participating in groups:  No. Taking medication as prescribed:  Yes. Tolerating medication:  Yes. Family/Significant othe contact made:  No, will contact:  [name], husband, [phone] Patient understands diagnosis:  Yes. Discussing patient identified problems/goals with staff:  Yes. Medical problems stabilized or resolved:  No. Denies suicidal/homicidal ideation: Yes. Issues/concerns per patient self-inventory:  No. Other:  New problem(s) identified: No, Describe:  None Identified  Discharge Plan or Barriers:  Reason for Continuation of Hospitalization: Other; describe Polysubstance Abuse (excluding opioids)  Comments:  Estimated length of stay: 2-3 days  New goal(s): "Get off drugs"   Attendees: Patient: [name] 08/04/2018 3:02 PM  Physician: Johnn Hai, MD 08/04/2018 3:02 PM  Nursing: Elesa Massed, RN 08/04/2018 3:02 PM  RN Care Manager: 08/04/2018 3:02 PM  Social Worker: Lurline Idol, [place] 08/04/2018 3:02 PM  Recreational Therapist:  08/04/2018 3:02 PM  Other:  08/04/2018 3:02 PM  Other:  08/04/2018 3:02 PM  Other: 08/04/2018 3:02 PM    Scribe for Treatment Team: Lawana Pai, MSW Intern [place] Department 08/04/2018 3:02 PM

## 2018-08-05 NOTE — Progress Notes (Signed)
[facility] MD Progress Note  08/05/2018 10:25 AM [name]  MRN:  [id number] Subjective:   Patient is alert and oriented she denies current hallucinations or thoughts of self-harm she acknowledges the substance abuse issues further she is seeking something for pain we of course cannot give her anything addicting but discussed Toradol.  Blood pressure bit low denies wanting to harm self as mentioned no cravings tremors or withdrawal symptoms mood somewhat depressed still  Principal Problem: Polysubstance (excluding opioids) dependence, daily use (HCC) Diagnosis: Principal Problem:   Polysubstance (excluding opioids) dependence, daily use (HCC) Active Problems:   Schizoaffective disorder, bipolar type ([place])   Substance induced mood disorder ([place])  Total Time spent with patient: 20 minutes  Past Medical History:  Past Medical History:  Diagnosis Date  . Abdominal pain   . Accidental drug overdose April 2013  . Anxiety   . Atrial fibrillation ([place]) 09/29/11   converted spontaneously  . Chronic back pain   . Chronic knee pain   . Chronic nausea   . Chronic pain   . COPD (chronic obstructive pulmonary disease) ([place])   . Depression   . Diabetes mellitus    states her doctor took her off all DM meds in past month  . Diabetic neuropathy ([place])   . Dyspnea    with exertion   . GERD (gastroesophageal reflux disease)   . Headache(784.0)    migraines   . HTN (hypertension)    not on meds since in a year   . Hyperlipidemia   . Hypothyroidism    not on meds in a while   . Mental disorder    Bipolar and schizophrenic  . Requires supplemental oxygen    as needed per patient   . Schizophrenia ([employer])   . Schizophrenia, acute ([place]) 11/13/2017  . Tobacco abuse     Past Surgical History:  Procedure Laterality Date  . ABDOMINAL HYSTERECTOMY    . BLADDER SUSPENSION  03/04/2011   Procedure: [facility] PROCEDURE;  Surgeon: Elayne Snare MacDiarmid;  Location: [facility];  Service: Urology;  Laterality: N/A;   . CYSTOCELE REPAIR  03/04/2011   Procedure: ANTERIOR REPAIR (CYSTOCELE);  Surgeon: Reece Packer;  Location: [facility];  Service: Urology;  Laterality: N/A;  . CYSTOSCOPY  03/04/2011   Procedure: CYSTOSCOPY;  Surgeon: Elayne Snare MacDiarmid;  Location: [facility];  Service: Urology;  Laterality: N/A;  . ESOPHAGOGASTRODUODENOSCOPY (EGD) WITH PROPOFOL N/A 05/12/2017   Procedure: ESOPHAGOGASTRODUODENOSCOPY (EGD) WITH PROPOFOL;  Surgeon: Alphonsa Overall, MD;  Location: [facility];  Service: General;  Laterality: N/A;  . GASTRIC ROUX-EN-Y N/A 03/25/2016   Procedure: LAPAROSCOPIC ROUX-EN-Y GASTRIC BYPASS WITH UPPER ENDOSCOPY;  Surgeon: Excell Seltzer, MD;  Location: [facility];  Service: General;  Laterality: N/A;  . KNEE SURGERY    . LAPAROSCOPIC ASSISTED VAGINAL HYSTERECTOMY  03/04/2011   Procedure: LAPAROSCOPIC ASSISTED VAGINAL HYSTERECTOMY;  Surgeon: Cyril Mourning, MD;  Location: [facility];  Service: Gynecology;  Laterality: N/A;   Family History:  Family History  Problem Relation Age of Onset  . Heart attack Father        56s  . Diabetes Mother   . Heart disease Mother   . Hypertension Mother   . Heart attack Sister        50  . COPD Other   . Breast cancer Neg Hx   Social History:  Social History   Substance and Sexual Activity  Alcohol Use Yes   Comment: daily     Social History  Substance and Sexual Activity  Drug Use Yes  . Types: Cocaine, Marijuana, "Crack" cocaine    Social History   Socioeconomic History  . Marital status: Legally Separated    Spouse name: Not on file  . Number of children: Not on file  . Years of education: Not on file  . Highest education level: Not on file  Occupational History  . Not on file  Social Needs  . Financial resource strain: Not on file  . Food insecurity:    Worry: Not on file    Inability: Not on file  . Transportation needs:    Medical: Not on file    Non-medical: Not on file  Tobacco Use  . Smoking status: Current Every Day  Smoker    Packs/day: 0.50    Types: Cigarettes  . Smokeless tobacco: Never Used  Substance and Sexual Activity  . Alcohol use: Yes    Comment: daily  . Drug use: Yes    Types: Cocaine, Marijuana, "Crack" cocaine  . Sexual activity: Yes    Partners: Male  Lifestyle  . Physical activity:    Days per week: Not on file    Minutes per session: Not on file  . Stress: Not on file  Relationships  . Social connections:    Talks on phone: Not on file    Gets together: Not on file    Attends religious service: Not on file    Active member of club or organization: Not on file    Attends meetings of clubs or organizations: Not on file    Relationship status: Not on file  Other Topics Concern  . Not on file  Social History Narrative  . Not on file   Additional Social History:                         Sleep: Fair  Appetite:  Good  Current Medications: Current Facility-Administered Medications  Medication Dose Route Frequency Provider Last Rate Last Dose  . acetaminophen (TYLENOL) tablet 650 mg  650 mg Oral Q6H PRN Laverle Hobby, PA-C   650 mg at 08/05/18 8657  . alum & mag hydroxide-simeth (MAALOX/MYLANTA) 200-200-20 MG/5ML suspension 30 mL  30 mL Oral Q4H PRN Patriciaann Clan E, PA-C      . amantadine (SYMMETREL) capsule 100 mg  100 mg Oral BID Johnn Hai, MD   100 mg at 08/05/18 0804  . chlordiazePOXIDE (LIBRIUM) capsule 25 mg  25 mg Oral QID PRN Laverle Hobby, PA-C   25 mg at 08/04/18 1829  . dicyclomine (BENTYL) tablet 20 mg  20 mg Oral Q6H PRN Patriciaann Clan E, PA-C      . feeding supplement (GLUCERNA SHAKE) (GLUCERNA SHAKE) liquid 237 mL  237 mL Oral TID BM Sharma Covert, MD   Stopped at 08/04/18 2305  . FLUoxetine (PROZAC) capsule 20 mg  20 mg Oral Daily Johnn Hai, MD   20 mg at 08/05/18 0804  . hydrOXYzine (ATARAX/VISTARIL) tablet 25 mg  25 mg Oral Q6H PRN Laverle Hobby, PA-C   25 mg at 08/04/18 1829  . ketorolac (TORADOL) injection 60 mg  60 mg  Intramuscular Once Johnn Hai, MD      . loperamide (IMODIUM) capsule 2-4 mg  2-4 mg Oral PRN Laverle Hobby, PA-C      . magnesium hydroxide (MILK OF MAGNESIA) suspension 30 mL  30 mL Oral Daily PRN Laverle Hobby, PA-C      .  methocarbamol (ROBAXIN) tablet 500 mg  500 mg Oral Q8H PRN Laverle Hobby, PA-C   500 mg at 08/05/18 6967  . nicotine (NICODERM CQ - dosed in mg/24 hours) patch 21 mg  21 mg Transdermal Daily Johnn Hai, MD   21 mg at 08/05/18 0803  . ondansetron (ZOFRAN-ODT) disintegrating tablet 4 mg  4 mg Oral Q6H PRN Laverle Hobby, PA-C      . traZODone (DESYREL) tablet 100 mg  100 mg Oral QHS,MR X 1 Laverle Hobby, PA-C   100 mg at 08/05/18 8938    Lab Results:  Results for orders placed or performed during the hospital encounter of 08/03/18 (from the past 48 hour(s))  Rapid urine drug screen (hospital performed)     Status: Abnormal   Collection Time: 08/03/18  3:02 PM  Result Value Ref Range   Opiates NONE DETECTED NONE DETECTED   Cocaine POSITIVE (A) NONE DETECTED   Benzodiazepines POSITIVE (A) NONE DETECTED   Amphetamines NONE DETECTED NONE DETECTED   Tetrahydrocannabinol POSITIVE (A) NONE DETECTED   Barbiturates NONE DETECTED NONE DETECTED    Comment: (NOTE) DRUG SCREEN FOR MEDICAL PURPOSES ONLY.  IF CONFIRMATION IS NEEDED FOR ANY PURPOSE, NOTIFY LAB WITHIN 5 DAYS. LOWEST DETECTABLE LIMITS FOR URINE DRUG SCREEN Drug Class                     Cutoff (ng/mL) Amphetamine and metabolites    1000 Barbiturate and metabolites    200 Benzodiazepine                 101 Tricyclics and metabolites     300 Opiates and metabolites        300 Cocaine and metabolites        300 THC                            50 Performed at [facility], [place] [address] [place], [place] [address]   Comprehensive metabolic panel     Status: Abnormal   Collection Time: 08/03/18  3:14 PM  Result Value Ref Range   Sodium 140 135 - 145 mmol/L   Potassium 3.7  3.5 - 5.1 mmol/L   Chloride 103 98 - 111 mmol/L   CO2 28 22 - 32 mmol/L   Glucose, Bld 132 (H) 70 - 99 mg/dL   BUN 8 6 - 20 mg/dL   Creatinine, Ser 0.58 0.44 - 1.00 mg/dL   Calcium 9.2 8.9 - 10.3 mg/dL   Total Protein 7.8 6.5 - 8.1 g/dL   Albumin 4.5 3.5 - 5.0 g/dL   AST 23 15 - 41 U/L   ALT 18 0 - 44 U/L   Alkaline Phosphatase 62 38 - 126 U/L   Total Bilirubin 0.6 0.3 - 1.2 mg/dL   GFR calc non Af Amer >60 >60 mL/min   GFR calc Af Amer >60 >60 mL/min   Anion gap 9 5 - 15    Comment: Performed at [facility], [place] [address], [place],  [address]  cbc     Status: Abnormal   Collection Time: 08/03/18  3:14 PM  Result Value Ref Range   WBC 7.3 4.0 - 10.5 K/uL   RBC 5.08 3.87 - 5.11 MIL/uL   Hemoglobin 13.5 12.0 - 15.0 g/dL   HCT 43.1 36.0 - 46.0 %   MCV 84.8 80.0 - 100.0 fL   MCH 26.6 26.0 -  34.0 pg   MCHC 31.3 30.0 - 36.0 g/dL   RDW 15.8 (H) 11.5 - 15.5 %   Platelets 315 150 - 400 K/uL   nRBC 0.0 0.0 - 0.2 %    Comment: Performed at [facility], [place] [address] [place], [place] [address]  Ethanol     Status: None   Collection Time: 08/03/18  3:15 PM  Result Value Ref Range   Alcohol, Ethyl (B) <10 <10 mg/dL    Comment: (NOTE) Lowest detectable limit for serum alcohol is 10 mg/dL. For medical purposes only. Performed at [facility], [place] [address] [place], [place] [address]   Salicylate level     Status: None   Collection Time: 08/03/18  3:15 PM  Result Value Ref Range   Salicylate Lvl <5.8 2.8 - 30.0 mg/dL    Comment: Performed at [facility], [place] [address], [place], [place] [address]  Acetaminophen level     Status: Abnormal   Collection Time: 08/03/18  3:15 PM  Result Value Ref Range   Acetaminophen (Tylenol), Serum <10 (L) 10 - 30 ug/mL    Comment: (NOTE) Therapeutic concentrations vary significantly. A range of 10-30 ug/mL  may be an effective concentration for many patients.  However, some  are best treated at concentrations outside of this range. Acetaminophen concentrations >150 ug/mL at 4 hours after ingestion  and >50 ug/mL at 12 hours after ingestion are often associated with  toxic reactions. Performed at [facility], [place] [address] [place], [place] [address]     Blood Alcohol level:  Lab Results  Component Value Date   ETH <10 08/03/2018   ETH <10 53/97/6734    Metabolic Disorder Labs: Lab Results  Component Value Date   HGBA1C 7.1 (H) 11/15/2017   MPG 157.07 11/15/2017   Lab Results  Component Value Date   PROLACTIN 32.8 (H) 11/15/2017   Lab Results  Component Value Date   CHOL 173 11/15/2017   TRIG 111 11/15/2017   HDL 54 11/15/2017   CHOLHDL 3.2 11/15/2017   VLDL 22 11/15/2017   LDLCALC 97 11/15/2017    Physical Findings: AIMS: Facial and Oral Movements Muscles of Facial Expression: None, normal Lips and Perioral Area: None, normal Jaw: None, normal Tongue: None, normal,Extremity Movements Upper (arms, wrists, hands, fingers): None, normal Lower (legs, knees, ankles, toes): None, normal, Trunk Movements Neck, shoulders, hips: None, normal, Overall Severity Severity of abnormal movements (highest score from questions above): None, normal Incapacitation due to abnormal movements: None, normal Patient's awareness of abnormal movements (rate only patient's report): No Awareness, Dental Status Current problems with teeth and/or dentures?: No Does patient usually wear dentures?: No  CIWA:    COWS:     Musculoskeletal: Strength & Muscle Tone: within normal limits Gait & Station: normal Patient leans: N/A  Psychiatric Specialty Exam: Physical Exam  ROS  Blood pressure 98/67, pulse (!) 104, temperature (!) 97.5 F (36.4 C), temperature source Oral, resp. rate 16, height 5\' 2"  (1.575 m), weight 47.6 kg, last menstrual period 01/08/2011.Body mass index is 19.2 kg/m.  General Appearance: Casual  Eye  Contact:  Good  Speech:  Clear and Coherent  Volume:  Decreased  Mood:  Dysphoric  Affect:  Congruent  Thought Process:  Coherent  Orientation:  Full (Time, Place, and Person)  Thought Content:  Logical and Tangential  Suicidal Thoughts:  No  Homicidal Thoughts:  No  Memory:  Immediate;   Fair  Judgement:  Intact  Insight:  Fair  Psychomotor Activity:  Normal  Concentration:  Concentration: Good  Recall:  Good  Fund of Knowledge:  Good  Language:  Good  Akathisia:  Negative  Handed:  Right  AIMS (if indicated):     Assets:  Physical Health Resilience  ADL's:  Intact  Cognition:  WNL  Sleep:  Number of Hours: 7.25     Treatment Plan Summary: Daily contact with patient to assess and evaluate symptoms and progress in treatment, Medication management and Plan Continue current measures precautions and groups no change in meds other than reduction of agents that may be lowering her blood pressure continue cognitive-based therapies and rehab based therapies and add Toradol  Anadalay Macdonell, MD 08/05/2018, 10:25 AM

## 2018-08-05 NOTE — Tx Team (Deleted)
Interdisciplinary Treatment Plan Update (Adult)  Date:  08/05/2018 Time Reviewed:  09:05 AM  Progress in Treatment: Attending groups: No. Participating in groups:  No. Taking medication as prescribed:  Yes. Tolerating medication:  Yes. Family/Significant othe contact made:  No, will contact:  [name], husband, [phone] Patient understands diagnosis:  Yes. Discussing patient identified problems/goals with staff:  Yes. Medical problems stabilized or resolved:  No. Denies suicidal/homicidal ideation: Yes. Issues/concerns per patient self-inventory:  No. Other:  New problem(s) identified: No, Describe:  None Identified  Discharge Plan or Barriers:  Reason for Continuation of Hospitalization: Other; describe Polysubstance Abuse (excluding opioids)  Comments:  Estimated length of stay: 2-3 days  New goal(s): "Get off drugs"   Attendees: Patient: [name] 08/04/2018 3:02 PM  Physician: Johnn Hai, MD 08/04/2018 3:02 PM  Nursing: Elesa Massed, RN 08/04/2018 3:02 PM  RN Care Manager: 08/04/2018 3:02 PM  Social Worker: Lurline Idol, [place] 08/04/2018 3:02 PM  Recreational Therapist:  08/04/2018 3:02 PM  Other:  08/04/2018 3:02 PM  Other:  08/04/2018 3:02 PM  Other: 08/04/2018 3:02 PM    Scribe for Treatment Team: Lawana Pai, MSW Intern [place] Department 08/04/2018 3:02 PM

## 2018-08-05 NOTE — Progress Notes (Signed)
Adult Psychoeducational Group Note  Date:  08/05/2018 Time:  9:55 PM  Group Topic/Focus:  Wrap-Up Group:   The focus of this group is to help patients review their daily goal of treatment and discuss progress on daily workbooks.  Participation Level:  Active  Participation Quality:  Appropriate  Affect:  Appropriate  Cognitive:  Appropriate  Insight: Appropriate  Engagement in Group:  Engaged  Modes of Intervention:  Discussion  Additional Comments:The patient expressed that she rates today a 7.The patient also attended group.  Nash Shearer 08/05/2018, 9:55 PM

## 2018-08-05 NOTE — Progress Notes (Signed)
CSW spoke to [facility] team lead Roberts Gaudy, [phone].  He was not aware pt was in [facility].  CSW update him on progress and discharge plan. They do have substance abuse specialist on the team who can assist with getting pt to residential treatment at [facility].  They can pick pt up at discharge, even on the weekend.  If weekend, please call crisis phone to arrange pickup: [phone]. Winferd [name], MSW, LCSW Clinical Social Worker 08/05/2018 1:08 PM

## 2018-08-06 MED ORDER — AMANTADINE HCL 100 MG PO CAPS: 100.0000 mg | ORAL_CAPSULE | Freq: Two times a day (BID) | ORAL | 1 refills | Status: DC

## 2018-08-06 MED ORDER — GABAPENTIN 300 MG PO CAPS: 300.0000 mg | ORAL_CAPSULE | Freq: Three times a day (TID) | ORAL | 2 refills | Status: DC

## 2018-08-06 MED ORDER — FLUOXETINE HCL 20 MG PO CAPS: 20.0000 mg | ORAL_CAPSULE | Freq: Every day | ORAL | 3 refills | Status: DC

## 2018-08-06 MED ORDER — LAMOTRIGINE 25 MG PO TABS: 25.0000 mg | ORAL_TABLET | Freq: Two times a day (BID) | ORAL | 1 refills | Status: DC

## 2018-08-06 NOTE — Progress Notes (Signed)
Patient ID: [name], female   DOB: 05/09/1966, 53 y.o.   MRN: [id number] Patient discharged to home/self care in the presence of her act team.  Patient denies SI, HI and AVH upon discharge.  Patient acknowledged understanding of all discharge instructions and receipt of personal belongings. Patient bright and eager to discharge.

## 2018-08-06 NOTE — Progress Notes (Signed)
Recreation Therapy Notes  Date: 2.14.19 Time: 1000 Location: [address] Dayroom   Group Topic: Communication, Team Building, Problem Solving  Goal Area(s) Addresses:  Patient will effectively work with peer towards shared goal.  Patient will identify skill used to make activity successful.  Patient will identify how skills used during activity can be used to reach post d/c goals.   Behavioral Response: Engaged  Intervention: STEM Activity   Activity: [employer]. Patients were provided the following materials: 5 drinking straws, 5 rubber bands, 5 paper clips, 2 index cards and 2 drinking cups. Using the provided materials patients were asked to build a launching mechanisms to launch a ping pong ball approximately 12 feet. Patients were divided into teams of 3-5.   Education: Education officer, community, Dentist.   Education Outcome: Acknowledges education/In group clarification offered/Needs additional education.   Clinical Observations/Feedback:  Pt worked well with her peers.  Pt expressed she didn't know how to complete the activity.  Pt continued to work through the activity despite some frustration.  Pt expressed the easiest part of the activity was "working with somebody that knew what they were doing".     Victorino Sparrow, LRT/CTRS     Ria Comment, Icelynn Onken A 08/06/2018 11:07 AM

## 2018-08-06 NOTE — Discharge Summary (Signed)
Physician Discharge Summary Note  Patient:  [name] is an 53 y.o., female MRN:  [id number] DOB:  09/13/1965 Patient phone:  [phone] (home)  Patient address:   [address] [address],  Total Time spent with patient: 45 minutes  Date of Admission:  08/03/2018 Date of Discharge: 08/06/18  Reason for Admission:    [name] is known to the service she is 53 years of age she walked in requesting a detox regimen again off cocaine and beer-she is also taking alprazolam at home see the admission note for further details.    Principal Problem: Polysubstance (excluding opioids) dependence, daily use ([place]) Discharge Diagnoses: Principal Problem:   Polysubstance (excluding opioids) dependence, daily use (HCC) Active Problems:   Schizoaffective disorder, bipolar type ([place])   Substance induced mood disorder ([place])    Past Medical History:  Past Medical History:  Diagnosis Date  . Abdominal pain   . Accidental drug overdose April 2013  . Anxiety   . Atrial fibrillation ([place]) 09/29/11   converted spontaneously  . Chronic back pain   . Chronic knee pain   . Chronic nausea   . Chronic pain   . COPD (chronic obstructive pulmonary disease) ([place])   . Depression   . Diabetes mellitus    states her doctor took her off all DM meds in past month  . Diabetic neuropathy ([place])   . Dyspnea    with exertion   . GERD (gastroesophageal reflux disease)   . Headache(784.0)    migraines   . HTN (hypertension)    not on meds since in a year   . Hyperlipidemia   . Hypothyroidism    not on meds in a while   . Mental disorder    Bipolar and schizophrenic  . Requires supplemental oxygen    as needed per patient   . Schizophrenia ([place])   . Schizophrenia, acute ([place]) 11/13/2017  . Tobacco abuse     Past Surgical History:  Procedure Laterality Date  . ABDOMINAL HYSTERECTOMY    . BLADDER SUSPENSION  03/04/2011   Procedure: [facility] PROCEDURE;  Surgeon: Elayne Snare MacDiarmid;   Location: [facility];  Service: Urology;  Laterality: N/A;  . CYSTOCELE REPAIR  03/04/2011   Procedure: ANTERIOR REPAIR (CYSTOCELE);  Surgeon: Reece Packer;  Location: [facility];  Service: Urology;  Laterality: N/A;  . CYSTOSCOPY  03/04/2011   Procedure: CYSTOSCOPY;  Surgeon: Elayne Snare MacDiarmid;  Location: [facility];  Service: Urology;  Laterality: N/A;  . ESOPHAGOGASTRODUODENOSCOPY (EGD) WITH PROPOFOL N/A 05/12/2017   Procedure: ESOPHAGOGASTRODUODENOSCOPY (EGD) WITH PROPOFOL;  Surgeon: Alphonsa Overall, MD;  Location: [facility];  Service: General;  Laterality: N/A;  . GASTRIC ROUX-EN-Y N/A 03/25/2016   Procedure: LAPAROSCOPIC ROUX-EN-Y GASTRIC BYPASS WITH UPPER ENDOSCOPY;  Surgeon: Excell Seltzer, MD;  Location: [facility];  Service: General;  Laterality: N/A;  . KNEE SURGERY    . LAPAROSCOPIC ASSISTED VAGINAL HYSTERECTOMY  03/04/2011   Procedure: LAPAROSCOPIC ASSISTED VAGINAL HYSTERECTOMY;  Surgeon: Cyril Mourning, MD;  Location: [facility];  Service: Gynecology;  Laterality: N/A;   Family History:  Family History  Problem Relation Age of Onset  . Heart attack Father        85s  . Diabetes Mother   . Heart disease Mother   . Hypertension Mother   . Heart attack Sister        48  . COPD Other   . Breast cancer Neg Hx     Social History:  Social History   Substance and Sexual Activity  Alcohol Use Yes   Comment: daily     Social History   Substance and Sexual Activity  Drug Use Yes  . Types: Cocaine, Marijuana, "Crack" cocaine    Social History   Socioeconomic History  . Marital status: Legally Separated    Spouse name: Not on file  . Number of children: Not on file  . Years of education: Not on file  . Highest education level: Not on file  Occupational History  . Not on file  Social Needs  . Financial resource strain: Not on file  . Food insecurity:    Worry: Not on file    Inability: Not on file  . Transportation needs:    Medical: Not on file    Non-medical: Not on  file  Tobacco Use  . Smoking status: Current Every Day Smoker    Packs/day: 0.50    Types: Cigarettes  . Smokeless tobacco: Never Used  Substance and Sexual Activity  . Alcohol use: Yes    Comment: daily  . Drug use: Yes    Types: Cocaine, Marijuana, "Crack" cocaine  . Sexual activity: Yes    Partners: Male  Lifestyle  . Physical activity:    Days per week: Not on file    Minutes per session: Not on file  . Stress: Not on file  Relationships  . Social connections:    Talks on phone: Not on file    Gets together: Not on file    Attends religious service: Not on file    Active member of club or organization: Not on file    Attends meetings of clubs or organizations: Not on file    Relationship status: Not on file  Other Topics Concern  . Not on file  Social History Narrative  . Not on file    Hospital Course:   Patient was admitted voluntarily her detox was overall successful and that she had minimal withdrawal symptoms and blood pressure remained stable we felt it best to get her off alprazolam as well as alcohol and of course cocaine she was given amantadine for cocaine cravings Librium for detox regimen and at the point of discharge we felt it was safe to go ahead and restart her lamotrigine.  She was very pain focused so we went ahead and added Neurontin for chronic pain issues.  But rounds were pretty typical on the 14th I was expecting her to request more days for stabilization but she was rather adamant that she wanted to go home and since she had progressed enough with a detox regimen, she had no thoughts of harming self or others, could contract fully had no withdrawal symptoms we went ahead and discharged her.  Physical Findings: AIMS: Facial and Oral Movements Muscles of Facial Expression: None, normal Lips and Perioral Area: None, normal Jaw: None, normal Tongue: None, normal,Extremity Movements Upper (arms, wrists, hands, fingers): None, normal Lower (legs, knees,  ankles, toes): None, normal, Trunk Movements Neck, shoulders, hips: None, normal, Overall Severity Severity of abnormal movements (highest score from questions above): None, normal Incapacitation due to abnormal movements: None, normal Patient's awareness of abnormal movements (rate only patient's report): No Awareness, Dental Status Current problems with teeth and/or dentures?: No Does patient usually wear dentures?: No  CIWA:    COWS:  COWS Total Score: 0  Musculoskeletal: Strength & Muscle Tone: within normal limits Gait & Station: normal Patient leans: N/A  Psychiatric Specialty Exam: Physical Exam  ROS  Blood pressure 115/80, pulse 93, temperature (!) 97.4 F (36.3 C), temperature source Oral, resp. rate 16, height 5\' 2"  (1.575 m), weight 47.6 kg, last menstrual period 01/08/2011.Body mass index is 19.2 kg/m.  General Appearance: Casual  Eye Contact:  Good  Speech:  Clear and Coherent  Volume:  Decreased  Mood:  Euthymic  Affect:  Congruent  Thought Process:  Coherent  Orientation:  Full (Time, Place, and Person)  Thought Content:  Logical  Suicidal Thoughts:  No  Homicidal Thoughts:  No  Memory:  Immediate;   Good  Judgement:  Good  Insight:  Good  Psychomotor Activity:  Normal  Concentration:  Concentration: Good  Recall:  Good  Fund of Knowledge:  Good  Language:  Good  Akathisia:  Negative  Handed:  Right  AIMS (if indicated):     Assets:  Physical Health Resilience Social Support  ADL's:  Intact  Cognition:  WNL  Sleep:  Number of Hours: 6.75     Have you used any form of tobacco in the last 30 days? (Cigarettes, Smokeless Tobacco, Cigars, and/or Pipes): Yes  Has this patient used any form of tobacco in the last 30 days? (Cigarettes, Smokeless Tobacco, Cigars, and/or Pipes) Yes, No  Blood Alcohol level:  Lab Results  Component Value Date   ETH <10 08/03/2018   ETH <10 32/99/2426    Metabolic Disorder Labs:  Lab Results  Component Value Date    HGBA1C 7.1 (H) 11/15/2017   MPG 157.07 11/15/2017   Lab Results  Component Value Date   PROLACTIN 32.8 (H) 11/15/2017   Lab Results  Component Value Date   CHOL 173 11/15/2017   TRIG 111 11/15/2017   HDL 54 11/15/2017   CHOLHDL 3.2 11/15/2017   VLDL 22 11/15/2017   LDLCALC 97 11/15/2017    See Psychiatric Specialty Exam and Suicide Risk Assessment completed by Attending Physician prior to discharge.  Discharge destination:  Home  Is patient on multiple antipsychotic therapies at discharge:  No   Has Patient had three or more failed trials of antipsychotic monotherapy by history:  No  Recommended Plan for Multiple Antipsychotic Therapies: NA   Allergies as of 08/06/2018      Reactions   Morphine And Related Nausea And Vomiting, Other (See Comments)   Pt stated she is not allergic to medication   Aspirin Nausea And Vomiting, Other (See Comments)   Ok to take tylenol or ibuprofen      Medication List    STOP taking these medications   ALPRAZolam 0.5 MG tablet Commonly known as:  XANAX   ALPRAZolam 1 MG tablet Commonly known as:  XANAX   temazepam 15 MG capsule Commonly known as:  RESTORIL     TAKE these medications     Indication  ABILIFY MAINTENA 400 MG Prsy prefilled syringe Generic drug:  ARIPiprazole ER Inject 400 mg as directed every 28 (twenty-eight) days. What changed:  Another medication with the same name was removed. Continue taking this medication, and follow the directions you see here.  Indication:  MIXED BIPOLAR AFFECTIVE DISORDER   albuterol 108 (90 Base) MCG/ACT inhaler Commonly known as:  PROAIR HFA Inhale 2 puffs into the lungs every 6 (six) hours as needed for wheezing or shortness of breath.  Indication:  Asthma   amantadine 100 MG capsule Commonly known as:  SYMMETREL Take 1 capsule (100 mg total) by mouth 2 (two) times daily.  Indication:  Extrapyramidal Reaction caused by Medications   diclofenac  sodium 1 % Gel Commonly known as:   VOLTAREN Apply 4 g topically 4 (four) times daily as needed (for pain). Arthritis  Indication:  Joint Damage causing Pain and Loss of Function   FLUoxetine 20 MG capsule Commonly known as:  PROZAC Take 1 capsule (20 mg total) by mouth daily. Start taking on:  August 07, 2018  Indication:  Depression   gabapentin 300 MG capsule Commonly known as:  NEURONTIN Take 1 capsule (300 mg total) by mouth 3 (three) times daily.  Indication:  Neuropathic Pain   ipratropium-albuterol 0.5-2.5 (3) MG/3ML Soln Commonly known as:  DUONEB Take 3 mLs by nebulization every 6 (six) hours as needed for wheezing or shortness of breath.  Indication:  Spasm of Lung Air Passages   lamoTRIgine 25 MG tablet Commonly known as:  LAMICTAL Take 1 tablet (25 mg total) by mouth 2 (two) times daily. D/c if rash seen What changed:    when to take this  additional instructions  Indication:  Mood stabilization   lidocaine 5 % Commonly known as:  LIDODERM Place 1 patch onto the skin daily. Remove & Discard patch within 12 hours or as directed by MD: For pain management  Indication:  Nerve Pain After Herpes Zoster or Shingles   nicotine 21 mg/24hr patch Commonly known as:  NICODERM CQ - dosed in mg/24 hours Place 1 patch (21 mg total) onto the skin daily. (May purchase from over the counter): For nicotine withdrawal symptoms  Indication:  Nicotine Addiction   pantoprazole 40 MG tablet Commonly known as:  PROTONIX Take 1 tablet (40 mg total) by mouth 2 (two) times daily as needed (indigestion).  Indication:  Stomach Ulcer, Gastroesophageal Reflux Disease   simvastatin 20 MG tablet Commonly known as:  ZOCOR Take 1 tablet (20 mg total) by mouth daily. For high cholesterol  Indication:  Inherited Homozygous Hypercholesterolemia, High Amount of Fats in the Blood   SYMBICORT 160-4.5 MCG/ACT inhaler Generic drug:  budesonide-formoterol Inhale 2 puffs into the lungs daily as needed (sob and wheezing).   Indication:  Asthma, Chronic Obstructive Lung Disease   tiotropium 18 MCG inhalation capsule Commonly known as:  SPIRIVA HANDIHALER Place 1 capsule (18 mcg total) into inhaler and inhale daily. For COPD  Indication:  Chronic Obstructive Lung Disease   traZODone 50 MG tablet Commonly known as:  DESYREL Take 1 tablet (50 mg total) by mouth at bedtime as needed for sleep.  Indication:  [facility] Follow up.   Specialty:  Addiction Medicine Why:  Referral made 08/04/2018. Please contact them daily after discharge to check on when a bed will become available.  Contact information: [place] [address] [phone]        [facility], [facility] Follow up.   Why:  Your ACT team will pick you up from the hospital and resume services immediately.  Contact information: [place] [address] [place] [address] [phone]           Follow-up recommendations:  Activity:  full    SignedJohnn Hai, MD 08/06/2018, 9:32 AM

## 2018-08-06 NOTE — Progress Notes (Signed)
D:  Faron was up and visible on the unit.  She attended evening wrap up group.  She denied SI/HI or A/V hallucinations.  She reported good visit with her husband this evening.  She declined taking her Glucerna because, "It tastes terrible.  I drink chocolate 'Premier Protein" shakes at home" and she is wondering if she could get those here.  She is also questioning her vitamins that she takes at home.  She is on special bariatric vitamins (multi vitamin as well as dissolvable calcium).  Encouraged her to discuss this with MD tomorrow.  She requested her hs medication early because she was ready for bed.  She took tylenol for chronic back pain and questioned when she will be getting another injection for pain.  She was informed that injection was one time only and she would need to discuss with MD to get that continued as well. She is currently resting with her eyes closed and appears to be asleep. A:  1:1 with RN for support and encouragement.  Medications as ordered.  Q 15 minute checks maintained for safety.  Encouraged participation in group and unit activities.   R:  Andriana remains safe on the unit.  We will continue to monitor the progress towards her goals.

## 2018-08-06 NOTE — BHH Suicide Risk Assessment (Signed)
[facility] Discharge Suicide Risk Assessment   Principal Problem: Polysubstance (excluding opioids) dependence, daily use ([place]) Discharge Diagnoses: Principal Problem:   Polysubstance (excluding opioids) dependence, daily use (HCC) Active Problems:   Schizoaffective disorder, bipolar type ([place])   Substance induced mood disorder ([place])   Total Time spent with patient: 45 minutes  Patient remains pain focused and med seeking denies wanting to harm self or others can contract fully has no cravings for drugs at this point  Mental Status Per Nursing Assessment::   On Admission:  NA  Demographic Factors:  ses  Loss Factors: Decrease in vocational status  Historical Factors: NA  Risk Reduction Factors:   Religious beliefs about death  Continued Clinical Symptoms:  Dysthymia  Cognitive Features That Contribute To Risk:  None    Suicide Risk:  Minimal: No identifiable suicidal ideation.  Patients presenting with no risk factors but with morbid ruminations; may be classified as minimal risk based on the severity of the depressive symptoms  Follow-up [facility] [facility] Follow up.   Specialty:  Addiction Medicine Why:  Referral made 08/04/2018. Please contact them daily after discharge to check on when a bed will become available.  Contact information: [place] [address] [phone]        [facility], [facility] Follow up.   Why:  Your ACT team will pick you up from the hospital and resume services immediately.  Contact information: [address] [place] [address] [phone]           Plan Of Care/Follow-up recommendations:  Activity:  full  Jakeline Dave, MD 08/06/2018, 9:30 AM

## 2018-08-06 NOTE — Progress Notes (Signed)
  [facility] Adult Case Management Discharge Plan :  Will you be returning to the same living situation after discharge:  Yes,  going home At discharge, do you have transportation home?: Yes,  ACT Team picking up at 12:30pm Do you have the ability to pay for your medications: Yes,  [facility] Medicare  Release of information consent forms completed and in the chart. Patient to Follow up at: Follow-up [facility] Follow up.   Specialty:  Addiction Medicine Why:  Referral made 08/04/2018. Please contact them daily after discharge to check on when a bed will become available.  Contact information: [place] [address] [phone]        [facility], [facility] Follow up.   Why:  Your ACT team will pick you up from the hospital and resume services immediately.  Contact information: [address] [place] [address] [phone]           Next level of care provider has access to [place] and Suicide Prevention discussed: Yes,  with husband  Have you used any form of tobacco in the last 30 days? (Cigarettes, Smokeless Tobacco, Cigars, and/or Pipes): Yes  Has patient been referred to the Quitline?: Patient refused referral  Patient has been referred for addiction treatment: Yes  Joellen Jersey, Ama 08/06/2018, 11:09 AM

## 2018-08-19 ENCOUNTER — Other Ambulatory Visit: Payer: Self-pay | Admitting: Internal Medicine

## 2018-08-19 DIAGNOSIS — Z1231 Encounter for screening mammogram for malignant neoplasm of breast: Secondary | ICD-10-CM

## 2018-08-24 ENCOUNTER — Ambulatory Visit
Admission: RE | Admit: 2018-08-24 | Discharge: 2018-08-24 | Disposition: A | Discharge status: Home / Self Care | Payer: Medicare Other | Source: Ambulatory Visit | Attending: Internal Medicine | Admitting: Internal Medicine

## 2018-08-24 DIAGNOSIS — Z1231 Encounter for screening mammogram for malignant neoplasm of breast: Secondary | ICD-10-CM

## 2019-01-03 ENCOUNTER — Encounter (HOSPITAL_COMMUNITY): Payer: Self-pay

## 2019-01-25 IMAGING — CR DG CHEST 2V
2 series · 2 of 2 positions shown · non-contrast
Comparison: 06/14/2017; 10/14/2015; 09/14/2015

CLINICAL DATA: Shortness of breath. History of COPD and
hypertension. Current smoker.

EXAM:
CHEST  2 VIEW

[w chest lat]
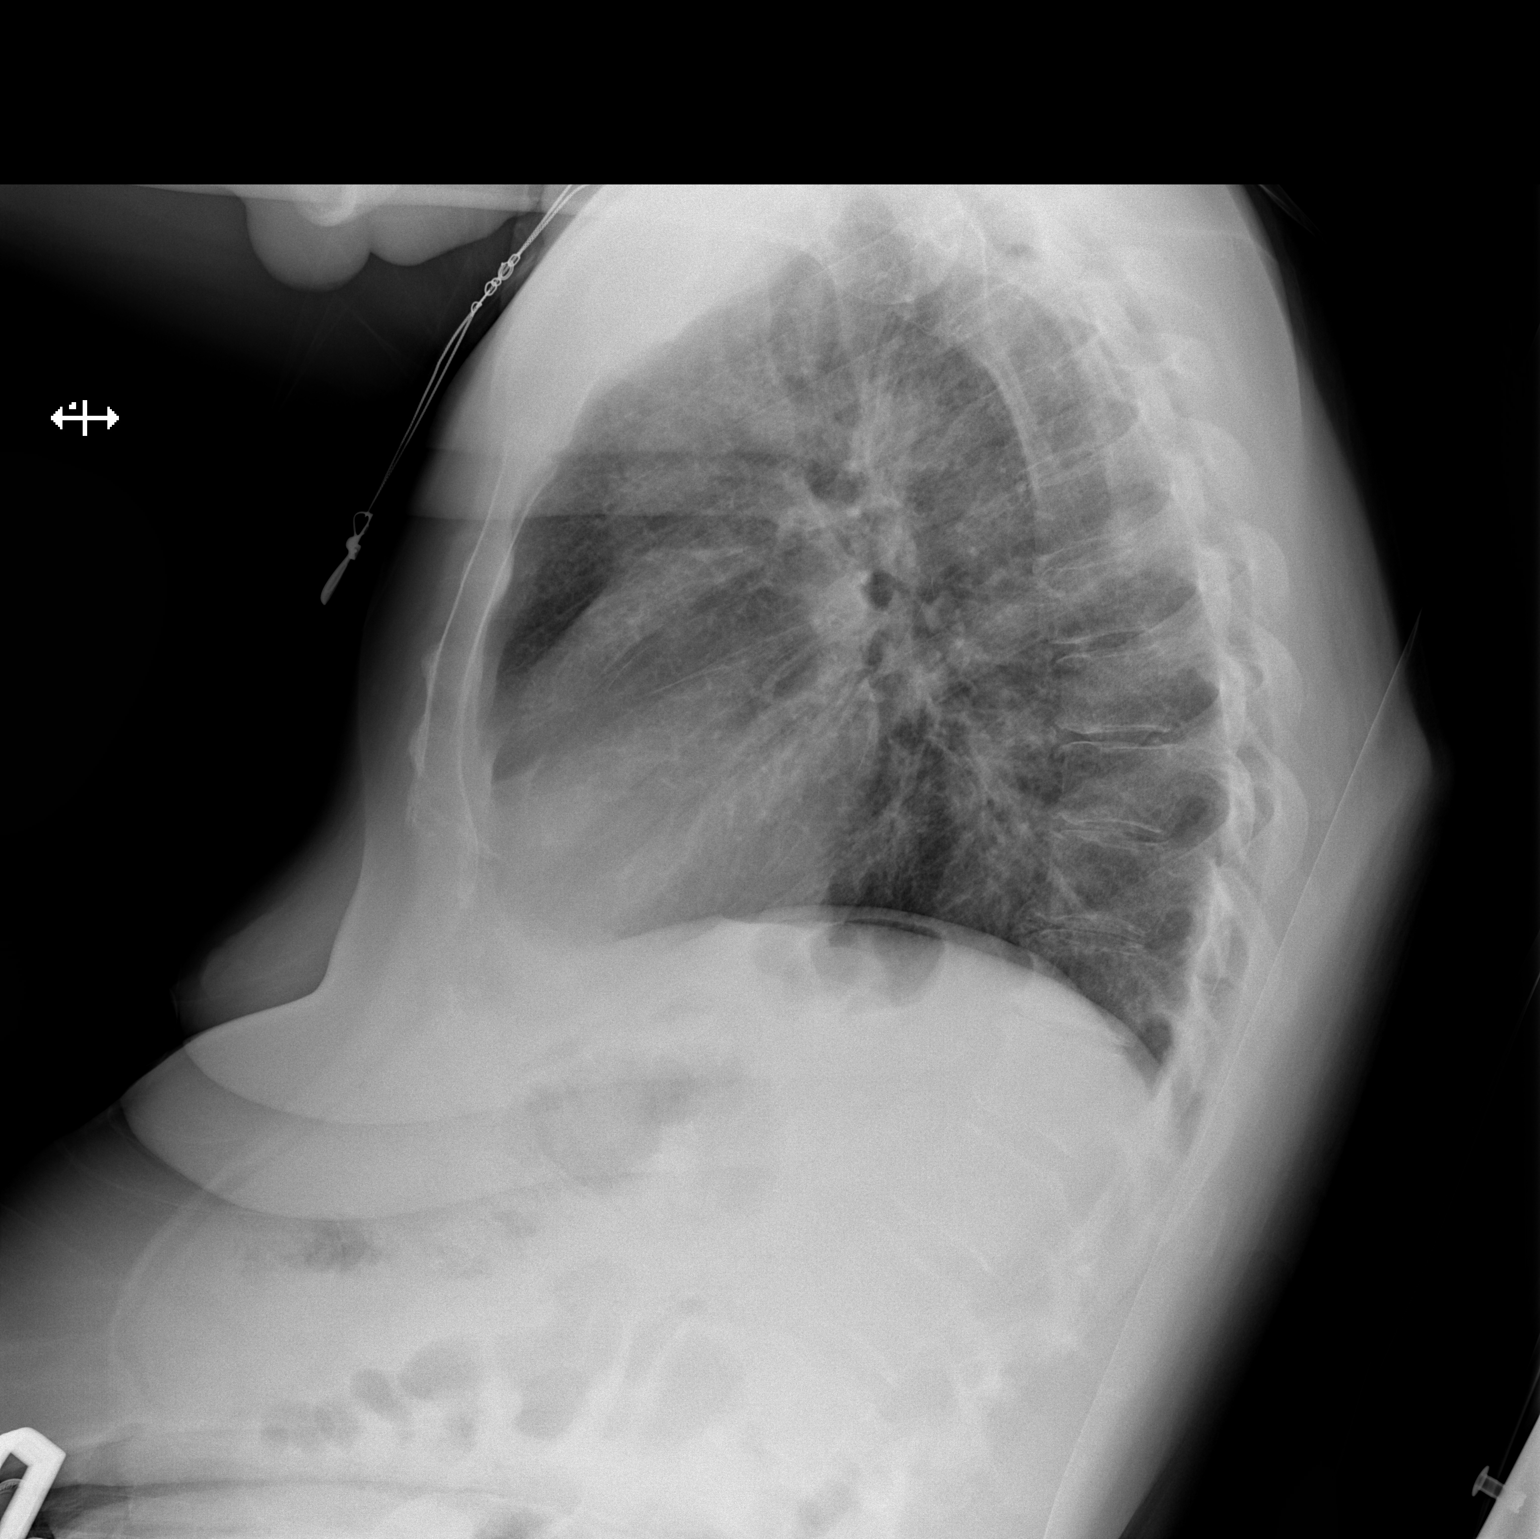

[x chest ap]
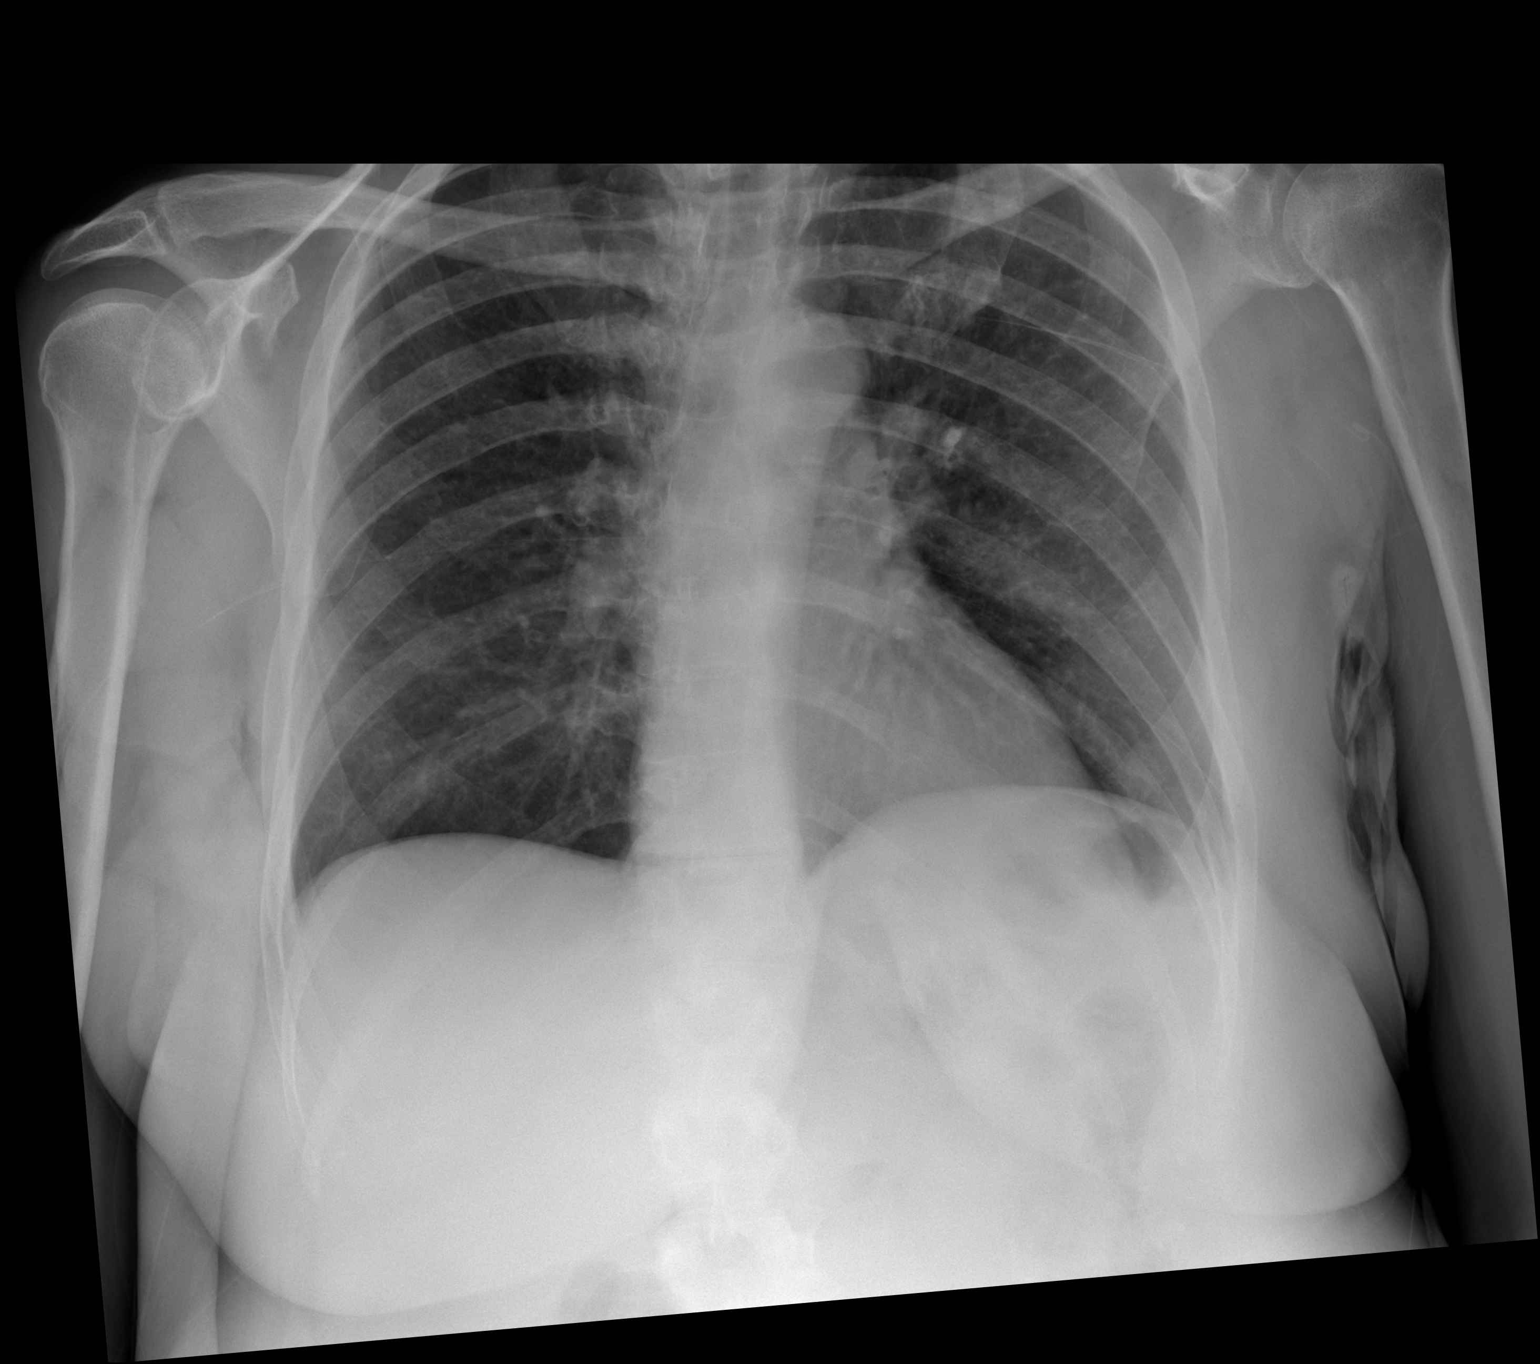

[2 of 2 positions shown; findings below may reference images not displayed]

FINDINGS: Grossly unchanged cardiac silhouette and mediastinal contours given
improved inspiratory effort. Overall improved aeration of lungs with
persistent slightly nodular interstitial thickening within the
bilateral mid lungs, left greater than right. No focal airspace
opacities. No pleural effusion pneumothorax. No evidence of edema.
No acute osseus abnormalities.
IMPRESSION: Improved aeration of the lungs with residual findings of chronic
bronchitic change.

## 2019-10-12 ENCOUNTER — Encounter (HOSPITAL_COMMUNITY): Payer: Self-pay

## 2020-04-20 ENCOUNTER — Ambulatory Visit (HOSPITAL_COMMUNITY)
Admission: EM | Admit: 2020-04-20 | Discharge: 2020-04-20 | Disposition: A | Discharge status: Home / Self Care | Payer: Medicare Other | Attending: Family Medicine | Admitting: Family Medicine

## 2020-04-20 ENCOUNTER — Encounter (HOSPITAL_COMMUNITY): Payer: Self-pay

## 2020-04-20 ENCOUNTER — Ambulatory Visit (INDEPENDENT_AMBULATORY_CARE_PROVIDER_SITE_OTHER): Payer: Medicare Other

## 2020-04-20 ENCOUNTER — Other Ambulatory Visit: Payer: Self-pay

## 2020-04-20 DIAGNOSIS — Z79899 Other long term (current) drug therapy: Secondary | ICD-10-CM | POA: Insufficient documentation

## 2020-04-20 DIAGNOSIS — R052 Subacute cough: Secondary | ICD-10-CM | POA: Diagnosis not present

## 2020-04-20 DIAGNOSIS — Z9981 Dependence on supplemental oxygen: Secondary | ICD-10-CM | POA: Diagnosis not present

## 2020-04-20 DIAGNOSIS — J449 Chronic obstructive pulmonary disease, unspecified: Secondary | ICD-10-CM | POA: Diagnosis not present

## 2020-04-20 DIAGNOSIS — F17210 Nicotine dependence, cigarettes, uncomplicated: Secondary | ICD-10-CM | POA: Insufficient documentation

## 2020-04-20 DIAGNOSIS — R000 Tachycardia, unspecified: Secondary | ICD-10-CM | POA: Diagnosis not present

## 2020-04-20 DIAGNOSIS — R0682 Tachypnea, not elsewhere classified: Secondary | ICD-10-CM | POA: Diagnosis not present

## 2020-04-20 DIAGNOSIS — Z20822 Contact with and (suspected) exposure to covid-19: Secondary | ICD-10-CM | POA: Diagnosis not present

## 2020-04-20 DIAGNOSIS — R059 Cough, unspecified: Secondary | ICD-10-CM

## 2020-04-20 DIAGNOSIS — M542 Cervicalgia: Secondary | ICD-10-CM | POA: Diagnosis present

## 2020-04-20 LAB — CBC WITH DIFFERENTIAL/PLATELET
Abs Immature Granulocytes: 0.02 10*3/uL (ref 0.00–0.07)
Basophils Absolute: 0.1 10*3/uL (ref 0.0–0.1)
Basophils Relative: 1 %
Eosinophils Absolute: 0 10*3/uL (ref 0.0–0.5)
Eosinophils Relative: 1 %
HCT: 44.7 % (ref 36.0–46.0)
Hemoglobin: 12.9 g/dL (ref 12.0–15.0)
Immature Granulocytes: 0 %
Lymphocytes Relative: 26 %
Lymphs Abs: 1.7 10*3/uL (ref 0.7–4.0)
MCH: 21.5 pg — ABNORMAL LOW (ref 26.0–34.0)
MCHC: 28.9 g/dL — ABNORMAL LOW (ref 30.0–36.0)
MCV: 74.4 fL — ABNORMAL LOW (ref 80.0–100.0)
Monocytes Absolute: 0.4 10*3/uL (ref 0.1–1.0)
Monocytes Relative: 6 %
Neutro Abs: 4.4 10*3/uL (ref 1.7–7.7)
Neutrophils Relative %: 66 %
Platelets: 394 10*3/uL (ref 150–400)
RBC: 6.01 MIL/uL — ABNORMAL HIGH (ref 3.87–5.11)
RDW: 20.2 % — ABNORMAL HIGH (ref 11.5–15.5)
WBC: 6.7 10*3/uL (ref 4.0–10.5)
nRBC: 0 % (ref 0.0–0.2)

## 2020-04-20 LAB — RESP PANEL BY RT PCR (RSV, FLU A&B, COVID)
Influenza A by PCR: NEGATIVE
Influenza B by PCR: NEGATIVE
Respiratory Syncytial Virus by PCR: NEGATIVE
SARS Coronavirus 2 by RT PCR: NEGATIVE

## 2020-04-20 MED ORDER — CYCLOBENZAPRINE HCL 5 MG PO TABS: 5.0000 mg | ORAL_TABLET | Freq: Three times a day (TID) | ORAL | 0 refills | Status: DC | PRN

## 2020-04-20 MED ORDER — KETOROLAC TROMETHAMINE 30 MG/ML IJ SOLN
INTRAMUSCULAR | Status: AC
  Filled 2020-04-20: qty 1

## 2020-04-20 MED ORDER — MELOXICAM 7.5 MG PO TABS: 7.5000 mg | ORAL_TABLET | Freq: Every day | ORAL | 0 refills | Status: AC

## 2020-04-20 MED ORDER — KETOROLAC TROMETHAMINE 30 MG/ML IJ SOLN
30.0000 mg | Freq: Once | INTRAMUSCULAR | Status: AC
  Administered 2020-04-20: 30 mg via INTRAMUSCULAR

## 2020-04-20 NOTE — Discharge Instructions (Signed)
Cyclobenzaprine as needed. May cause drowsiness. Please do not take if driving or drinking alcohol.   If no improvement this evening after the medication we have given you, or if any worsening, please go to the ER.  Please follow up with your primary care provider for recheck if symptoms perist

## 2020-04-20 NOTE — ED Notes (Addendum)
Pt was placed on 2L O2 via [place] and O2Sat increased to 96-97%, remains tachypneic.

## 2020-04-20 NOTE — ED Triage Notes (Addendum)
Pt presents with complaints of cramping on the left side of her neck x 6 days. Reports taking ibuprofen and tizanidine at home with no relief. Denies any injury.  Pt has productive cough during intake report this is her normal cough and she uses oxygen at home as needed. Pulse ox is 90% pt placed on 2L [place].

## 2020-04-20 NOTE — ED Provider Notes (Signed)
[place]    CSN: [id number] Arrival date & time: 04/20/20  1144      History   Chief Complaint Chief Complaint  Patient presents with   Neck Pain    HPI [name] is a 54 y.o. female.   [name] presents with complaints of left neck and jaw pain. Started a week ago and is worsening. Can be worse with movement or touch,  but feels pain even at rest. Left neck. Pain with eating/chewing. No specific ear pain. No known fevers. She has had cough and congestion for a few weeks now, was evaluated and told it is allergies. History of COPD. She states she has oxygen for at home use, on arrival to triage she requires O2 placement to maintain sats >90%. States she smokes cigarettes but denies any other drug use. Drank alcohol last night. No gi symptoms. No chest pain . No dizziness. Has tried taking ibuprofen and tizanidine for pain which haven't helped. On chart review has been evaluated for left neck pain in the past, in 2018. She is unable to recall this or if it is similar.    ROS per HPI, negative if not otherwise mentioned.      Past Medical History:  Diagnosis Date   Abdominal pain    Accidental drug overdose April 2013   Anxiety    Atrial fibrillation ([place]) 09/29/11   converted spontaneously   Chronic back pain    Chronic knee pain    Chronic nausea    Chronic pain    COPD (chronic obstructive pulmonary disease) ([place])    Depression    Diabetes mellitus    states her doctor took her off all DM meds in past month   Diabetic neuropathy ([place])    Dyspnea    with exertion    GERD (gastroesophageal reflux disease)    Headache(784.0)    migraines    HTN (hypertension)    not on meds since in a year    Hyperlipidemia    Hypothyroidism    not on meds in a while    Mental disorder    Bipolar and schizophrenic   Requires supplemental oxygen    as needed per patient    Schizophrenia ([place])    Schizophrenia, acute ([place]) 11/13/2017    Tobacco abuse     Patient Active Problem List   Diagnosis Date Noted   Polysubstance (excluding opioids) dependence, daily use ([place]) 08/04/2018   Substance induced mood disorder ([place]) 08/03/2018   Schizoaffective disorder, bipolar type ([place]) 11/13/2017   Polysubstance abuse ([place]) 11/13/2017   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   Abdominal pain 04/18/2016   Dehydration 04/17/2016   Morbid obesity ([place]) 03/25/2016   Type 2 diabetes mellitus with diabetic polyneuropathy, with long-term current use of insulin ([place]) 09/06/2015   Vaginitis and vulvovaginitis, unspecified 04/19/2013   BV (bacterial vaginosis) 04/19/2013   HTN (hypertension) 11/04/2011   Hypokalemia 10/02/2011   atrial fibrillation 09/30/2011   Acute respiratory failure ([place]) 09/29/2011   COPD (chronic obstructive pulmonary disease) ([place]) 09/29/2011   Accidental drug overdose 09/29/2011    Past Surgical History:  Procedure Laterality Date   ABDOMINAL HYSTERECTOMY     BLADDER SUSPENSION  03/04/2011   Procedure: [facility] PROCEDURE;  Surgeon: Elayne Snare MacDiarmid;  Location: [facility];  Service: Urology;  Laterality: N/A;   CYSTOCELE REPAIR  03/04/2011   Procedure: ANTERIOR REPAIR (CYSTOCELE);  Surgeon: Reece Packer;  Location: [facility];  Service: Urology;  Laterality: N/A;   CYSTOSCOPY  03/04/2011   Procedure: CYSTOSCOPY;  Surgeon: Elayne Snare MacDiarmid;  Location: [facility];  Service: Urology;  Laterality: N/A;   ESOPHAGOGASTRODUODENOSCOPY (EGD) WITH PROPOFOL N/A 05/12/2017   Procedure: ESOPHAGOGASTRODUODENOSCOPY (EGD) WITH PROPOFOL;  Surgeon: Alphonsa Overall, MD;  Location: [facility];  Service: General;  Laterality: N/A;   GASTRIC ROUX-EN-Y N/A 03/25/2016   Procedure: LAPAROSCOPIC ROUX-EN-Y GASTRIC BYPASS WITH UPPER ENDOSCOPY;  Surgeon: Excell Seltzer, MD;  Location: [facility];  Service: General;  Laterality: N/A;   KNEE SURGERY     LAPAROSCOPIC ASSISTED VAGINAL  HYSTERECTOMY  03/04/2011   Procedure: LAPAROSCOPIC ASSISTED VAGINAL HYSTERECTOMY;  Surgeon: Cyril Mourning, MD;  Location: [facility];  Service: Gynecology;  Laterality: N/A;    OB History    Gravida  3   Para  3   Term      Preterm      AB  0   Living  3     SAB  0   TAB  0   Ectopic  0   Multiple  0   Live Births               Home Medications    Prior to Admission medications   Medication Sig Start Date End Date Taking? Authorizing Provider  ABILIFY MAINTENA 400 MG PRSY prefilled syringe Inject 400 mg as directed every 28 (twenty-eight) days. 07/12/18   [provider]  albuterol (PROAIR HFA) 108 (90 Base) MCG/ACT inhaler Inhale 2 puffs into the lungs every 6 (six) hours as needed for wheezing or shortness of breath. 11/16/17   Lindell Spar I, NP  amantadine (SYMMETREL) 100 MG capsule Take 1 capsule (100 mg total) by mouth 2 (two) times daily. 08/06/18   Johnn Hai, MD  cyclobenzaprine (FLEXERIL) 5 MG tablet Take 1-2 tablets (5-10 mg total) by mouth 3 (three) times daily as needed for muscle spasms. 04/20/20   Augusto Gamble B, NP  diclofenac sodium (VOLTAREN) 1 % GEL Apply 4 g topically 4 (four) times daily as needed (for pain). Arthritis 11/16/17   Lindell Spar I, NP  FLUoxetine (PROZAC) 20 MG capsule Take 1 capsule (20 mg total) by mouth daily. 08/07/18   Johnn Hai, MD  gabapentin (NEURONTIN) 300 MG capsule Take 1 capsule (300 mg total) by mouth 3 (three) times daily. 08/06/18 08/06/19  Johnn Hai, MD  ipratropium-albuterol (DUONEB) 0.5-2.5 (3) MG/3ML SOLN Take 3 mLs by nebulization every 6 (six) hours as needed for wheezing or shortness of breath. 06/06/18   [provider]  lamoTRIgine (LAMICTAL) 25 MG tablet Take 1 tablet (25 mg total) by mouth 2 (two) times daily. D/c if rash seen 08/06/18   Johnn Hai, MD  lidocaine (LIDODERM) 5 % Place 1 patch onto the skin daily. Remove & Discard patch within 12 hours or as directed by MD: For pain  management 11/17/17   Lindell Spar I, NP  meloxicam (MOBIC) 7.5 MG tablet Take 1 tablet (7.5 mg total) by mouth daily for 20 days. 04/20/20 05/10/20  Zigmund Gottron, NP  nicotine (NICODERM CQ - DOSED IN MG/24 HOURS) 21 mg/24hr patch Place 1 patch (21 mg total) onto the skin daily. (May purchase from over the counter): For nicotine withdrawal symptoms Patient not taking: Reported on 08/03/2018 11/17/17   Lindell Spar I, NP  pantoprazole (PROTONIX) 40 MG tablet Take 1 tablet (40 mg total) by mouth 2 (two) times daily as needed (indigestion). 11/16/17   Encarnacion Slates, NP  simvastatin (ZOCOR) 20 MG tablet Take 1 tablet (20 mg total) by mouth daily. For high cholesterol Patient not taking: Reported on 08/03/2018 11/16/17   Lindell Spar I, NP  SYMBICORT 160-4.5 MCG/ACT inhaler Inhale 2 puffs into the lungs daily as needed (sob and wheezing). 11/16/17   Lindell Spar I, NP  tiotropium (SPIRIVA HANDIHALER) 18 MCG inhalation capsule Place 1 capsule (18 mcg total) into inhaler and inhale daily. For COPD 11/17/17   Lindell Spar I, NP  traZODone (DESYREL) 50 MG tablet Take 1 tablet (50 mg total) by mouth at bedtime as needed for sleep. Patient not taking: Reported on 08/03/2018 11/16/17   Lindell Spar I, NP    Family History Family History  Problem Relation Age of Onset   Heart attack Father        22s   Diabetes Mother    Heart disease Mother    Hypertension Mother    Heart attack Sister        23   COPD Other    Breast cancer Neg Hx     Social History Social History   Tobacco Use   Smoking status: Current Every Day Smoker    Packs/day: 0.50    Types: Cigarettes   Smokeless tobacco: Never Used  Vaping Use   Vaping Use: Never used  Substance Use Topics   Alcohol use: Yes    Comment: daily   Drug use: Yes    Types: Cocaine, Marijuana, "Crack" cocaine     Allergies   Morphine and related and Aspirin   Review of Systems Review of Systems   Physical Exam Triage Vital  Signs ED Triage Vitals  Enc Vitals Group     BP 04/20/20 1232 (!) 153/89     Pulse Rate 04/20/20 1232 (!) 105     Resp 04/20/20 1232 (!) 32     Temp 04/20/20 1232 98.4 F (36.9 C)     Temp src --      SpO2 04/20/20 1232 90 %     Weight --      Height --      Head Circumference --      Peak Flow --      Pain Score 04/20/20 1231 9     Pain Loc --      Pain Edu? --      Excl. in [place]? --    No data found.  Updated Vital Signs BP (!) 153/89    Pulse (!) 105    Temp 98.4 F (36.9 C)    Resp (!) 32    LMP 01/08/2011    SpO2 90%   Visual Acuity Right Eye Distance:   Left Eye Distance:   Bilateral Distance:    Right Eye Near:   Left Eye Near:    Bilateral Near:     Physical Exam Constitutional:      General: She is not in acute distress.    Appearance: She is well-developed.     Comments: Tremor related to feeling cold noted   HENT:     Head: Normocephalic and atraumatic.     Right Ear: Tympanic membrane and ear canal normal.     Left Ear: Tympanic membrane and ear canal normal.     Nose: Nose normal.     Mouth/Throat:     Mouth: Mucous membranes are moist.     Comments: No obvious visible or palpable salivary gland; no trismus  Neck:      Comments: Left neck pain,  noticeable pain even at rest, patient will startle with pain and hold her neck when it comes; initially on exam has tenderness to touch and pain with rotation to the right. On reevaluation patient with improved ROM without significant trigger of pain Cardiovascular:     Rate and Rhythm: Tachycardia present.  Pulmonary:     Effort: Pulmonary effort is normal. Tachypnea present.     Comments: Congested cough noted  Musculoskeletal:     Cervical back: No erythema, rigidity, torticollis or crepitus. Pain with movement and muscular tenderness present. Normal range of motion.  Skin:    General: Skin is warm and dry.  Neurological:     Mental Status: She is alert and oriented to person, place, and time.     EKG:  Sinus tach rate of 104 . Previous EKG was available for review. No stwave changes as interpreted by me.  Reviewed with supervising physician dr. Meda Coffee    UC Treatments / Results  Labs (all labs ordered are listed, but only abnormal results are displayed) Labs Reviewed  CBC WITH DIFFERENTIAL/PLATELET - Abnormal; Notable for the following components:      Result Value   RBC 6.01 (*)    MCV 74.4 (*)    MCH 21.5 (*)    MCHC 28.9 (*)    RDW 20.2 (*)    All other components within normal limits  RESP PANEL BY RT PCR (RSV, FLU A&B, COVID)    EKG   Radiology DG Chest 2 View  Result Date: 04/20/2020 CLINICAL DATA:  Cough and tachypnea EXAM: CHEST - 2 VIEW COMPARISON:  June 27, 2017 FINDINGS: The lungs are clear. The heart size and pulmonary vascularity are normal. No adenopathy. There is mild degenerative change in the lower thoracic spine. IMPRESSION: Lungs clear.  Cardiac silhouette normal. Electronically Signed   By: Lowella Grip III M.D.   On: 04/20/2020 13:50    Procedures Procedures (including critical care time)  Medications Ordered in UC Medications  ketorolac (TORADOL) 30 MG/ML injection 30 mg (30 mg Intramuscular Given 04/20/20 1432)    Initial Impression / Assessment and Plan / UC Course  I have reviewed the triage vital signs and the nursing notes.  Pertinent labs & imaging results that were available during my care of the patient were reviewed by me and considered in my medical decision making (see chart for details).     Cough with increased respiratory rate, patient states this is at baseline for her, however. Recommended use of her O2 which she states she does have. No recent injury/ trauma to the head or neck for consideration of carotid dissection; chest xray and ekg without acute findings here today. Musculoskeletal neck pain treatment initiated today, with toradol given and er return precautions provided. Patient verbalized understanding and  agreeable to plan.   Final Clinical Impressions(s) / UC Diagnoses   Final diagnoses:  Neck pain     Discharge Instructions     Cyclobenzaprine as needed. May cause drowsiness. Please do not take if driving or drinking alcohol.   If no improvement this evening after the medication we have given you, or if any worsening, please go to the ER.  Please follow up with your primary care provider for recheck if symptoms perist    ED Prescriptions    Medication Sig Dispense Auth. Provider   meloxicam (MOBIC) 7.5 MG tablet Take 1 tablet (7.5 mg total) by mouth daily for 20 days. 20 tablet Augusto Gamble B, NP   cyclobenzaprine (  FLEXERIL) 5 MG tablet Take 1-2 tablets (5-10 mg total) by mouth 3 (three) times daily as needed for muscle spasms. 20 tablet Zigmund Gottron, NP     PDMP not reviewed this encounter.   Zigmund Gottron, NP 04/20/20 1606

## 2020-07-24 ENCOUNTER — Inpatient Hospital Stay (HOSPITAL_COMMUNITY)
Admission: EM | Admit: 2020-07-24 | Discharge: 2020-07-27 | DRG: 190 | Disposition: A | Discharge status: Home / Self Care | Payer: Medicare Other | Attending: Internal Medicine | Admitting: Internal Medicine

## 2020-07-24 ENCOUNTER — Encounter (HOSPITAL_COMMUNITY): Payer: Self-pay | Admitting: Emergency Medicine

## 2020-07-24 ENCOUNTER — Emergency Department (HOSPITAL_COMMUNITY): Payer: Medicare Other

## 2020-07-24 ENCOUNTER — Other Ambulatory Visit: Payer: Self-pay

## 2020-07-24 DIAGNOSIS — J9621 Acute and chronic respiratory failure with hypoxia: Secondary | ICD-10-CM | POA: Diagnosis not present

## 2020-07-24 DIAGNOSIS — Z681 Body mass index (BMI) 19 or less, adult: Secondary | ICD-10-CM | POA: Diagnosis not present

## 2020-07-24 DIAGNOSIS — F1410 Cocaine abuse, uncomplicated: Secondary | ICD-10-CM | POA: Diagnosis present

## 2020-07-24 DIAGNOSIS — F319 Bipolar disorder, unspecified: Secondary | ICD-10-CM | POA: Diagnosis present

## 2020-07-24 DIAGNOSIS — J449 Chronic obstructive pulmonary disease, unspecified: Secondary | ICD-10-CM

## 2020-07-24 DIAGNOSIS — J9601 Acute respiratory failure with hypoxia: Secondary | ICD-10-CM

## 2020-07-24 DIAGNOSIS — I10 Essential (primary) hypertension: Secondary | ICD-10-CM | POA: Diagnosis present

## 2020-07-24 DIAGNOSIS — Z8249 Family history of ischemic heart disease and other diseases of the circulatory system: Secondary | ICD-10-CM

## 2020-07-24 DIAGNOSIS — R636 Underweight: Secondary | ICD-10-CM | POA: Diagnosis present

## 2020-07-24 DIAGNOSIS — E039 Hypothyroidism, unspecified: Secondary | ICD-10-CM | POA: Diagnosis present

## 2020-07-24 DIAGNOSIS — Z885 Allergy status to narcotic agent status: Secondary | ICD-10-CM

## 2020-07-24 DIAGNOSIS — D509 Iron deficiency anemia, unspecified: Secondary | ICD-10-CM | POA: Diagnosis present

## 2020-07-24 DIAGNOSIS — E1165 Type 2 diabetes mellitus with hyperglycemia: Secondary | ICD-10-CM | POA: Diagnosis present

## 2020-07-24 DIAGNOSIS — Z7984 Long term (current) use of oral hypoglycemic drugs: Secondary | ICD-10-CM

## 2020-07-24 DIAGNOSIS — K219 Gastro-esophageal reflux disease without esophagitis: Secondary | ICD-10-CM | POA: Diagnosis present

## 2020-07-24 DIAGNOSIS — D72829 Elevated white blood cell count, unspecified: Secondary | ICD-10-CM | POA: Diagnosis present

## 2020-07-24 DIAGNOSIS — Z7951 Long term (current) use of inhaled steroids: Secondary | ICD-10-CM

## 2020-07-24 DIAGNOSIS — Z794 Long term (current) use of insulin: Secondary | ICD-10-CM

## 2020-07-24 DIAGNOSIS — Z9884 Bariatric surgery status: Secondary | ICD-10-CM | POA: Diagnosis not present

## 2020-07-24 DIAGNOSIS — F419 Anxiety disorder, unspecified: Secondary | ICD-10-CM | POA: Diagnosis present

## 2020-07-24 DIAGNOSIS — Z833 Family history of diabetes mellitus: Secondary | ICD-10-CM

## 2020-07-24 DIAGNOSIS — F209 Schizophrenia, unspecified: Secondary | ICD-10-CM | POA: Diagnosis present

## 2020-07-24 DIAGNOSIS — Z20822 Contact with and (suspected) exposure to covid-19: Secondary | ICD-10-CM | POA: Diagnosis present

## 2020-07-24 DIAGNOSIS — T380X5A Adverse effect of glucocorticoids and synthetic analogues, initial encounter: Secondary | ICD-10-CM | POA: Diagnosis present

## 2020-07-24 DIAGNOSIS — Z886 Allergy status to analgesic agent status: Secondary | ICD-10-CM

## 2020-07-24 DIAGNOSIS — J441 Chronic obstructive pulmonary disease with (acute) exacerbation: Secondary | ICD-10-CM | POA: Diagnosis present

## 2020-07-24 DIAGNOSIS — Z79899 Other long term (current) drug therapy: Secondary | ICD-10-CM | POA: Diagnosis not present

## 2020-07-24 DIAGNOSIS — E1140 Type 2 diabetes mellitus with diabetic neuropathy, unspecified: Secondary | ICD-10-CM | POA: Diagnosis present

## 2020-07-24 DIAGNOSIS — I4891 Unspecified atrial fibrillation: Secondary | ICD-10-CM | POA: Diagnosis present

## 2020-07-24 DIAGNOSIS — E785 Hyperlipidemia, unspecified: Secondary | ICD-10-CM | POA: Diagnosis present

## 2020-07-24 DIAGNOSIS — F17210 Nicotine dependence, cigarettes, uncomplicated: Secondary | ICD-10-CM | POA: Diagnosis present

## 2020-07-24 DIAGNOSIS — J9602 Acute respiratory failure with hypercapnia: Secondary | ICD-10-CM | POA: Diagnosis present

## 2020-07-24 DIAGNOSIS — G8929 Other chronic pain: Secondary | ICD-10-CM | POA: Diagnosis present

## 2020-07-24 DIAGNOSIS — E559 Vitamin D deficiency, unspecified: Secondary | ICD-10-CM | POA: Diagnosis present

## 2020-07-24 DIAGNOSIS — G9341 Metabolic encephalopathy: Secondary | ICD-10-CM | POA: Diagnosis present

## 2020-07-24 DIAGNOSIS — Z825 Family history of asthma and other chronic lower respiratory diseases: Secondary | ICD-10-CM

## 2020-07-24 DIAGNOSIS — F1920 Other psychoactive substance dependence, uncomplicated: Secondary | ICD-10-CM

## 2020-07-24 DIAGNOSIS — E1142 Type 2 diabetes mellitus with diabetic polyneuropathy: Secondary | ICD-10-CM

## 2020-07-24 LAB — BASIC METABOLIC PANEL
Anion gap: 11 (ref 5–15)
BUN: 6 mg/dL (ref 6–20)
CO2: 25 mmol/L (ref 22–32)
Calcium: 8.6 mg/dL — ABNORMAL LOW (ref 8.9–10.3)
Chloride: 97 mmol/L — ABNORMAL LOW (ref 98–111)
Creatinine, Ser: 0.64 mg/dL (ref 0.44–1.00)
GFR, Estimated: >60 mL/min (ref >60)
Glucose, Bld: 199 mg/dL — ABNORMAL HIGH (ref 70–99)
Potassium: 4.8 mmol/L (ref 3.5–5.1)
Sodium: 133 mmol/L — ABNORMAL LOW (ref 135–145)

## 2020-07-24 LAB — CBC
HCT: 35 % — ABNORMAL LOW (ref 36.0–46.0)
Hemoglobin: 10.5 g/dL — ABNORMAL LOW (ref 12.0–15.0)
MCH: 21.6 pg — ABNORMAL LOW (ref 26.0–34.0)
MCHC: 30 g/dL (ref 30.0–36.0)
MCV: 71.9 fL — ABNORMAL LOW (ref 80.0–100.0)
Platelets: 337 10*3/uL (ref 150–400)
RBC: 4.87 MIL/uL (ref 3.87–5.11)
RDW: 19.2 % — ABNORMAL HIGH (ref 11.5–15.5)
WBC: 9.4 10*3/uL (ref 4.0–10.5)
nRBC: 0 % (ref 0.0–0.2)

## 2020-07-24 LAB — I-STAT VENOUS BLOOD GAS, ED
Acid-Base Excess: 3 mmol/L — ABNORMAL HIGH (ref 0.0–2.0)
Acid-Base Excess: 4 mmol/L — ABNORMAL HIGH (ref 0.0–2.0)
Bicarbonate: 28.1 mmol/L — ABNORMAL HIGH (ref 20.0–28.0)
Bicarbonate: 32.4 mmol/L — ABNORMAL HIGH (ref 20.0–28.0)
Calcium, Ion: 1 mmol/L — ABNORMAL LOW (ref 1.15–1.40)
Calcium, Ion: 1.11 mmol/L — ABNORMAL LOW (ref 1.15–1.40)
HCT: 35 % — ABNORMAL LOW (ref 36.0–46.0)
HCT: 34 % — ABNORMAL LOW (ref 36.0–46.0)
Hemoglobin: 11.9 g/dL — ABNORMAL LOW (ref 12.0–15.0)
Hemoglobin: 11.6 g/dL — ABNORMAL LOW (ref 12.0–15.0)
O2 Saturation: 69 %
O2 Saturation: 98 %
Potassium: 4 mmol/L (ref 3.5–5.1)
Potassium: 3.9 mmol/L (ref 3.5–5.1)
Sodium: 137 mmol/L (ref 135–145)
Sodium: 138 mmol/L (ref 135–145)
TCO2: 29 mmol/L (ref 22–32)
TCO2: 34 mmol/L — ABNORMAL HIGH (ref 22–32)
pCO2, Ven: 45.1 mmHg (ref 44.0–60.0)
pCO2, Ven: 67.2 mmHg — ABNORMAL HIGH (ref 44.0–60.0)
pH, Ven: 7.402 (ref 7.250–7.430)
pH, Ven: 7.292 (ref 7.250–7.430)
pO2, Ven: 36 mmHg (ref 32.0–45.0)
pO2, Ven: 111 mmHg — ABNORMAL HIGH (ref 32.0–45.0)

## 2020-07-24 LAB — RAPID URINE DRUG SCREEN, HOSP PERFORMED
Amphetamines: NOT DETECTED
Barbiturates: NOT DETECTED
Benzodiazepines: NOT DETECTED
Cocaine: POSITIVE — AB
Opiates: NOT DETECTED
Tetrahydrocannabinol: NOT DETECTED

## 2020-07-24 LAB — I-STAT ARTERIAL BLOOD GAS, ED
Acid-Base Excess: 5 mmol/L — ABNORMAL HIGH (ref 0.0–2.0)
Bicarbonate: 30.3 mmol/L — ABNORMAL HIGH (ref 20.0–28.0)
Calcium, Ion: 1.15 mmol/L (ref 1.15–1.40)
HCT: 34 % — ABNORMAL LOW (ref 36.0–46.0)
Hemoglobin: 11.6 g/dL — ABNORMAL LOW (ref 12.0–15.0)
O2 Saturation: 95 %
Patient temperature: 98.3
Potassium: 4 mmol/L (ref 3.5–5.1)
Sodium: 138 mmol/L (ref 135–145)
TCO2: 32 mmol/L (ref 22–32)
pCO2 arterial: 45.1 mmHg (ref 32.0–48.0)
pH, Arterial: 7.435 (ref 7.350–7.450)
pO2, Arterial: 73 mmHg — ABNORMAL LOW (ref 83.0–108.0)

## 2020-07-24 LAB — SARS CORONAVIRUS 2 BY RT PCR (HOSPITAL ORDER, PERFORMED IN ~~LOC~~ HOSPITAL LAB): SARS Coronavirus 2: NEGATIVE

## 2020-07-24 LAB — POC SARS CORONAVIRUS 2 AG -  ED: SARS Coronavirus 2 Ag: NEGATIVE

## 2020-07-24 LAB — CBG MONITORING, ED: Glucose-Capillary: 190 mg/dL — ABNORMAL HIGH (ref 70–99)

## 2020-07-24 LAB — D-DIMER, QUANTITATIVE: D-Dimer, Quant: 0.39 ug/mL-FEU (ref 0.00–0.50)

## 2020-07-24 MED ORDER — NALOXONE HCL 0.4 MG/ML IJ SOLN
INTRAMUSCULAR | Status: AC
  Administered 2020-07-24: 0.4 mg via INTRAVENOUS
  Filled 2020-07-24: qty 1

## 2020-07-24 MED ORDER — SODIUM CHLORIDE 0.9 % IV SOLN
500.0000 mg | INTRAVENOUS | Status: AC
  Administered 2020-07-24: 500 mg via INTRAVENOUS
  Filled 2020-07-24: qty 500

## 2020-07-24 MED ORDER — PANTOPRAZOLE SODIUM 40 MG PO TBEC: 40.0000 mg | DELAYED_RELEASE_TABLET | Freq: Two times a day (BID) | ORAL | Status: DC | PRN

## 2020-07-24 MED ORDER — FLUTICASONE FUROATE-VILANTEROL 200-25 MCG/INH IN AEPB
1.0000 | INHALATION_SPRAY | Freq: Every day | RESPIRATORY_TRACT | Status: DC
  Administered 2020-07-25: 09:00:00 1 via RESPIRATORY_TRACT
  Filled 2020-07-24 (×3): qty 28

## 2020-07-24 MED ORDER — FLUOXETINE HCL 20 MG PO CAPS
20.0000 mg | ORAL_CAPSULE | Freq: Every day | ORAL | Status: DC
  Administered 2020-07-25 – 2020-07-27 (×3): 20 mg via ORAL
  Filled 2020-07-24 (×3): qty 1

## 2020-07-24 MED ORDER — ENOXAPARIN SODIUM 40 MG/0.4ML ~~LOC~~ SOLN
40.0000 mg | SUBCUTANEOUS | Status: DC
  Administered 2020-07-24 – 2020-07-26 (×3): 40 mg via SUBCUTANEOUS
  Filled 2020-07-24 (×3): qty 0.4

## 2020-07-24 MED ORDER — ALBUTEROL SULFATE (2.5 MG/3ML) 0.083% IN NEBU: 5.0000 mg | INHALATION_SOLUTION | RESPIRATORY_TRACT | Status: DC | PRN

## 2020-07-24 MED ORDER — CYCLOBENZAPRINE HCL 10 MG PO TABS: 5.0000 mg | ORAL_TABLET | Freq: Three times a day (TID) | ORAL | Status: DC | PRN

## 2020-07-24 MED ORDER — TIOTROPIUM BROMIDE MONOHYDRATE 18 MCG IN CAPS
18.0000 ug | ORAL_CAPSULE | Freq: Every day | RESPIRATORY_TRACT | Status: DC
  Filled 2020-07-24: qty 5

## 2020-07-24 MED ORDER — LORAZEPAM 2 MG/ML IJ SOLN
0.5000 mg | Freq: Once | INTRAMUSCULAR | Status: AC
  Administered 2020-07-24: 0.5 mg via INTRAVENOUS
  Filled 2020-07-24: qty 1

## 2020-07-24 MED ORDER — LEVALBUTEROL HCL 1.25 MG/0.5ML IN NEBU
1.2500 mg | INHALATION_SOLUTION | Freq: Four times a day (QID) | RESPIRATORY_TRACT | Status: DC
  Administered 2020-07-24 – 2020-07-27 (×10): 1.25 mg via RESPIRATORY_TRACT
  Filled 2020-07-24 (×10): qty 0.5

## 2020-07-24 MED ORDER — NALOXONE HCL 0.4 MG/ML IJ SOLN: 0.4000 mg | Freq: Once | INTRAMUSCULAR | Status: AC

## 2020-07-24 MED ORDER — AZITHROMYCIN 250 MG PO TABS
500.0000 mg | ORAL_TABLET | Freq: Every day | ORAL | Status: DC
  Administered 2020-07-25 – 2020-07-27 (×3): 500 mg via ORAL
  Filled 2020-07-24 (×3): qty 2

## 2020-07-24 MED ORDER — NICOTINE 21 MG/24HR TD PT24
21.0000 mg | MEDICATED_PATCH | Freq: Every day | TRANSDERMAL | Status: DC
  Administered 2020-07-24 – 2020-07-27 (×4): 21 mg via TRANSDERMAL
  Filled 2020-07-24 (×5): qty 1

## 2020-07-24 MED ORDER — HALOPERIDOL LACTATE 5 MG/ML IJ SOLN: 5.0000 mg | Freq: Four times a day (QID) | INTRAMUSCULAR | Status: AC | PRN

## 2020-07-24 MED ORDER — DICLOFENAC SODIUM 1 % EX GEL
4.0000 g | Freq: Four times a day (QID) | CUTANEOUS | Status: DC | PRN
  Filled 2020-07-24: qty 100

## 2020-07-24 MED ORDER — LAMOTRIGINE 25 MG PO TABS
25.0000 mg | ORAL_TABLET | Freq: Two times a day (BID) | ORAL | Status: DC
  Administered 2020-07-25 – 2020-07-27 (×6): 25 mg via ORAL
  Filled 2020-07-24 (×9): qty 1

## 2020-07-24 MED ORDER — GABAPENTIN 100 MG PO CAPS
100.0000 mg | ORAL_CAPSULE | Freq: Three times a day (TID) | ORAL | Status: DC
  Administered 2020-07-25 – 2020-07-27 (×8): 100 mg via ORAL
  Filled 2020-07-24 (×8): qty 1

## 2020-07-24 MED ORDER — INSULIN ASPART 100 UNIT/ML ~~LOC~~ SOLN
0.0000 [IU] | Freq: Three times a day (TID) | SUBCUTANEOUS | Status: DC
  Administered 2020-07-25: 5 [IU] via SUBCUTANEOUS
  Administered 2020-07-25: 8 [IU] via SUBCUTANEOUS
  Administered 2020-07-25: 2 [IU] via SUBCUTANEOUS
  Administered 2020-07-26: 5 [IU] via SUBCUTANEOUS
  Administered 2020-07-26: 3 [IU] via SUBCUTANEOUS
  Administered 2020-07-26: 8 [IU] via SUBCUTANEOUS

## 2020-07-24 MED ORDER — METHYLPREDNISOLONE SODIUM SUCC 40 MG IJ SOLR
40.0000 mg | Freq: Four times a day (QID) | INTRAMUSCULAR | Status: DC
  Administered 2020-07-24 – 2020-07-26 (×7): 40 mg via INTRAVENOUS
  Filled 2020-07-24 (×7): qty 1

## 2020-07-24 MED ORDER — UMECLIDINIUM BROMIDE 62.5 MCG/INH IN AEPB
1.0000 | INHALATION_SPRAY | Freq: Every day | RESPIRATORY_TRACT | Status: DC
  Filled 2020-07-24 (×2): qty 7

## 2020-07-24 MED ORDER — ALBUTEROL (5 MG/ML) CONTINUOUS INHALATION SOLN
15.0000 mg/h | INHALATION_SOLUTION | RESPIRATORY_TRACT | Status: AC
  Administered 2020-07-24: 15 mg/h via RESPIRATORY_TRACT
  Filled 2020-07-24: qty 20

## 2020-07-24 MED ORDER — LEVALBUTEROL HCL 0.63 MG/3ML IN NEBU: 0.6300 mg | INHALATION_SOLUTION | Freq: Four times a day (QID) | RESPIRATORY_TRACT | Status: DC | PRN

## 2020-07-24 NOTE — ED Notes (Addendum)
Zhang MD at bedside, RT at bedside

## 2020-07-24 NOTE — ED Notes (Signed)
Give 0.4 of narcan per Roosevelt Locks MD

## 2020-07-24 NOTE — ED Notes (Signed)
Pt is more alert and responsive than prior to being in BiPAP.

## 2020-07-24 NOTE — ED Notes (Signed)
Pt had coughing fit, unable to cough anything up. Pt 85% on 2.5L. Pt is too drowsy and unable to follow commands to breath in her nose and out of her mouth to obtain O2. O2 increased to 6L to help O2 sats return to above 90.

## 2020-07-24 NOTE — ED Notes (Signed)
Pt unable to answer orientation questions at this time d/t. Pt stating that she can't breath. Pt is using accessory muscles and labored, pt very drowsy. Notified RT, Pt O2 dropped to 91 on 3L, increased O2 to 6L and pt is 94%. Pt unable to stay awake to maintain conversation. Notified MD as well about concern.

## 2020-07-24 NOTE — Progress Notes (Signed)
RT note. Pt. Taken off BIPAP at this time per ABG results. Pt. On 15L [place]. Sat 95%. Pt. On 4L [place] sat low 80s. Pt. Currently eating right now. Resting comfortable. RN aware, RT will continue to monitor.

## 2020-07-24 NOTE — ED Notes (Signed)
Pt is 92% on 6L w/ [place] in pt's mouth

## 2020-07-24 NOTE — ED Notes (Addendum)
Pt still very drowsy, labored breathing, accessory muscle use, 89% on 0.5L w/ [place] in mouth d/t pt unable to follow commands to stay awake and breath in nose and out of mouth. Notified RT an notified RT and MD of VBG results

## 2020-07-24 NOTE — ED Notes (Addendum)
Ok to update pt's daughter per pt. [name]'s number is in the Emergency Contacts. Updated pt's daughter on pt status.

## 2020-07-24 NOTE — ED Notes (Signed)
Pt now 96% on 6L, titrating pt's O2 down to 3L w/ [place] still in pt's mouth.

## 2020-07-24 NOTE — ED Notes (Signed)
RT at bedside. Notifying MD of events

## 2020-07-24 NOTE — H&P (Signed)
History and Physical    [name] [id number] DOB: [birth year] DOA: 07/24/2020  PCP: [name], MD (Confirm with patient/family/NH records and if not entered, this has to be entered at [facility] point of entry) Patient coming from: [facility]  I have personally briefly reviewed patient's old medical records in [place]  Chief Complaint: SOB  HPI: [name] is a 55 y.o. female with medical history significant of asthma/COPD, cocaine abuse, anxiety depression, HLD, presented with new onset of shortness of breath for 1 day.  Patient lethargic, most history provided by patient husband over the phone.  Husband reported patient has been having more cough for the last few days but this morning patient woke up with complaint of severe shortness of breath and wheezing.  And family called EMS.  Husband also confirmed that patient has been continue to smoke and use cocaine/snorting.  Appears to have no fever and no chest pains. ED Course: Patient was agitated and 1 dose of Ativan given.  VBG on admission 7.4/45/36 chest x-ray clear and COVID-19 test negative.  Treatment became somnolent at depending of hypoxia, repeat ABG showed 7.29/67/110 Review of Systems: Unable to perform patient very sleepy  Past Medical History:  Diagnosis Date  . Abdominal pain   . Accidental drug overdose April 2013  . Anxiety   . Atrial fibrillation ([place]) 09/29/11   converted spontaneously  . Chronic back pain   . Chronic knee pain   . Chronic nausea   . Chronic pain   . COPD (chronic obstructive pulmonary disease) ([place])   . Depression   . Diabetes mellitus    states her doctor took her off all DM meds in past month  . Diabetic neuropathy ([place])   . Dyspnea    with exertion   . GERD (gastroesophageal reflux disease)   . Headache(784.0)    migraines   . HTN (hypertension)    not on meds since in a year   . Hyperlipidemia   . Hypothyroidism    not on meds in a while   . Mental disorder    Bipolar  and schizophrenic  . Requires supplemental oxygen    as needed per patient   . Schizophrenia ([place])   . Schizophrenia, acute ([place]) 11/13/2017  . Tobacco abuse     Past Surgical History:  Procedure Laterality Date  . ABDOMINAL HYSTERECTOMY    . BLADDER SUSPENSION  03/04/2011   Procedure: [facility] PROCEDURE;  Surgeon: Elayne Snare MacDiarmid;  Location: [facility];  Service: Urology;  Laterality: N/A;  . CYSTOCELE REPAIR  03/04/2011   Procedure: ANTERIOR REPAIR (CYSTOCELE);  Surgeon: Reece Packer;  Location: [facility];  Service: Urology;  Laterality: N/A;  . CYSTOSCOPY  03/04/2011   Procedure: CYSTOSCOPY;  Surgeon: Elayne Snare MacDiarmid;  Location: [facility];  Service: Urology;  Laterality: N/A;  . ESOPHAGOGASTRODUODENOSCOPY (EGD) WITH PROPOFOL N/A 05/12/2017   Procedure: ESOPHAGOGASTRODUODENOSCOPY (EGD) WITH PROPOFOL;  Surgeon: Alphonsa Overall, MD;  Location: [facility];  Service: General;  Laterality: N/A;  . GASTRIC ROUX-EN-Y N/A 03/25/2016   Procedure: LAPAROSCOPIC ROUX-EN-Y GASTRIC BYPASS WITH UPPER ENDOSCOPY;  Surgeon: Excell Seltzer, MD;  Location: [facility];  Service: General;  Laterality: N/A;  . KNEE SURGERY    . LAPAROSCOPIC ASSISTED VAGINAL HYSTERECTOMY  03/04/2011   Procedure: LAPAROSCOPIC ASSISTED VAGINAL HYSTERECTOMY;  Surgeon: Cyril Mourning, MD;  Location: [facility];  Service: Gynecology;  Laterality: N/A;     reports that she has been smoking cigarettes. She has been smoking  about 0.50 packs per day. She has never used smokeless tobacco. She reports current alcohol use. She reports current drug use. Drugs: Cocaine, Marijuana, and "Crack" cocaine.  Allergies  Allergen Reactions  . Morphine And Related Nausea And Vomiting and Other (See Comments)    Pt stated she is not allergic to medication  . Aspirin Nausea And Vomiting and Other (See Comments)    Ok to take tylenol or ibuprofen    Family History  Problem Relation Age of Onset  . Heart attack Father        32s  . Diabetes Mother    . Heart disease Mother   . Hypertension Mother   . Heart attack Sister        1  . COPD Other   . Breast cancer Neg Hx     Prior to Admission medications   Medication Sig Start Date End Date Taking? Authorizing Provider  ABILIFY MAINTENA 400 MG PRSY prefilled syringe Inject 400 mg as directed every 28 (twenty-eight) days. 07/12/18   [provider]  albuterol (PROAIR HFA) 108 (90 Base) MCG/ACT inhaler Inhale 2 puffs into the lungs every 6 (six) hours as needed for wheezing or shortness of breath. 11/16/17   Lindell Spar I, NP  amantadine (SYMMETREL) 100 MG capsule Take 1 capsule (100 mg total) by mouth 2 (two) times daily. 08/06/18   Johnn Hai, MD  cyclobenzaprine (FLEXERIL) 5 MG tablet Take 1-2 tablets (5-10 mg total) by mouth 3 (three) times daily as needed for muscle spasms. 04/20/20   Augusto Gamble B, NP  diclofenac sodium (VOLTAREN) 1 % GEL Apply 4 g topically 4 (four) times daily as needed (for pain). Arthritis 11/16/17   Lindell Spar I, NP  FLUoxetine (PROZAC) 20 MG capsule Take 1 capsule (20 mg total) by mouth daily. 08/07/18   Johnn Hai, MD  gabapentin (NEURONTIN) 300 MG capsule Take 1 capsule (300 mg total) by mouth 3 (three) times daily. 08/06/18 08/06/19  Johnn Hai, MD  ipratropium-albuterol (DUONEB) 0.5-2.5 (3) MG/3ML SOLN Take 3 mLs by nebulization every 6 (six) hours as needed for wheezing or shortness of breath. 06/06/18   [provider]  lamoTRIgine (LAMICTAL) 25 MG tablet Take 1 tablet (25 mg total) by mouth 2 (two) times daily. D/c if rash seen 08/06/18   Johnn Hai, MD  lidocaine (LIDODERM) 5 % Place 1 patch onto the skin daily. Remove & Discard patch within 12 hours or as directed by MD: For pain management 11/17/17   Lindell Spar I, NP  nicotine (NICODERM CQ - DOSED IN MG/24 HOURS) 21 mg/24hr patch Place 1 patch (21 mg total) onto the skin daily. (May purchase from over the counter): For nicotine withdrawal symptoms Patient not taking: Reported on  08/03/2018 11/17/17   Lindell Spar I, NP  pantoprazole (PROTONIX) 40 MG tablet Take 1 tablet (40 mg total) by mouth 2 (two) times daily as needed (indigestion). 11/16/17   Lindell Spar I, NP  simvastatin (ZOCOR) 20 MG tablet Take 1 tablet (20 mg total) by mouth daily. For high cholesterol Patient not taking: Reported on 08/03/2018 11/16/17   Lindell Spar I, NP  SYMBICORT 160-4.5 MCG/ACT inhaler Inhale 2 puffs into the lungs daily as needed (sob and wheezing). 11/16/17   Lindell Spar I, NP  tiotropium (SPIRIVA HANDIHALER) 18 MCG inhalation capsule Place 1 capsule (18 mcg total) into inhaler and inhale daily. For COPD 11/17/17   Lindell Spar I, NP  traZODone (DESYREL) 50 MG tablet Take 1 tablet (50  mg total) by mouth at bedtime as needed for sleep. Patient not taking: Reported on 08/03/2018 11/16/17   Lindell Spar I, NP    Physical Exam: Vitals:   07/24/20 1424 07/24/20 1425 07/24/20 1426 07/24/20 1430  BP:    (!) 153/91  Pulse: (!) 119 (!) 114 (!) 113 (!) 120  Resp: 16 (!) 25 (!) 30 (!) 23  Temp:      TempSrc:      SpO2: 100% 96% 99% 96%  Weight:      Height:        Constitutional: NAD, calm, comfortable Vitals:   07/24/20 1424 07/24/20 1425 07/24/20 1426 07/24/20 1430  BP:    (!) 153/91  Pulse: (!) 119 (!) 114 (!) 113 (!) 120  Resp: 16 (!) 25 (!) 30 (!) 23  Temp:      TempSrc:      SpO2: 100% 96% 99% 96%  Weight:      Height:       Eyes: PERRL, lids and conjunctivae normal.  Pupils are pinpointed and very sluggish. ENMT: Mucous membranes are moist. Posterior pharynx clear of any exudate or lesions.Normal dentition.  Neck: normal, supple, no masses, no thyromegaly Respiratory: Coarse breathing sound bilaterally, diffused wheezing and crackles bilaterally.  Increasing respiratory effort. No accessory muscle use.  Cardiovascular: Regular rate and rhythm, no murmurs / rubs / gallops. No extremity edema. 2+ pedal pulses. No carotid bruits.  Abdomen: no tenderness, no masses palpated. No  hepatosplenomegaly. Bowel sounds positive.  Musculoskeletal: no clubbing / cyanosis. No joint deformity upper and lower extremities. Good ROM, no contractures. Normal muscle tone.  Skin: no rashes, lesions, ulcers. No induration Neurologic: No facial droops, confused and moving all limbs but not following command.  Psychiatric: Sleepy    Labs on Admission: I have personally reviewed following labs and imaging studies  CBC: Recent Labs  Lab 07/24/20 0915 07/24/20 1038 07/24/20 1255  WBC 9.4  --   --   HGB 10.5* 11.9* 11.6*  HCT 35.0* 35.0* 34.0*  MCV 71.9*  --   --   PLT 337  --   --    Basic Metabolic Panel: Recent Labs  Lab 07/24/20 0915 07/24/20 1038 07/24/20 1255  NA 133* 137 138  K 4.8 4.0 3.9  CL 97*  --   --   CO2 25  --   --   GLUCOSE 199*  --   --   BUN 6  --   --   CREATININE 0.64  --   --   CALCIUM 8.6*  --   --    GFR: Estimated Creatinine Clearance: 56.3 mL/min (by C-G formula based on SCr of 0.64 mg/dL). Liver Function Tests: No results for input(s): AST, ALT, ALKPHOS, BILITOT, PROT, ALBUMIN in the last 168 hours. No results for input(s): LIPASE, AMYLASE in the last 168 hours. No results for input(s): AMMONIA in the last 168 hours. Coagulation Profile: No results for input(s): INR, PROTIME in the last 168 hours. Cardiac Enzymes: No results for input(s): CKTOTAL, CKMB, CKMBINDEX, TROPONINI in the last 168 hours. BNP (last 3 results) No results for input(s): PROBNP in the last 8760 hours. HbA1C: No results for input(s): HGBA1C in the last 72 hours. CBG: No results for input(s): GLUCAP in the last 168 hours. Lipid Profile: No results for input(s): CHOL, HDL, LDLCALC, TRIG, CHOLHDL, LDLDIRECT in the last 72 hours. Thyroid Function Tests: No results for input(s): TSH, T4TOTAL, FREET4, T3FREE, THYROIDAB in the last 72 hours. Anemia  Panel: No results for input(s): VITAMINB12, FOLATE, FERRITIN, TIBC, IRON, RETICCTPCT in the last 72 hours. Urine  analysis:    Component Value Date/Time   COLORURINE YELLOW 11/12/2017 1427   APPEARANCEUR CLEAR 11/12/2017 1427   LABSPEC 1.009 11/12/2017 1427   PHURINE 7.0 11/12/2017 1427   GLUCOSEU NEGATIVE 11/12/2017 1427   HGBUR NEGATIVE 11/12/2017 1427   BILIRUBINUR NEGATIVE 11/12/2017 1427   KETONESUR NEGATIVE 11/12/2017 1427   PROTEINUR NEGATIVE 11/12/2017 1427   UROBILINOGEN 0.2 09/04/2013 0426   NITRITE NEGATIVE 11/12/2017 1427   LEUKOCYTESUR NEGATIVE 11/12/2017 1427    Radiological Exams on Admission: DG Chest Portable 1 View  Result Date: 07/24/2020 CLINICAL DATA:  Shortness of breath. EXAM: PORTABLE CHEST 1 VIEW COMPARISON:  April 20, 2020. FINDINGS: The heart size and mediastinal contours are within normal limits. Both lungs are clear. No pneumothorax or pleural effusion is noted. The visualized skeletal structures are unremarkable. IMPRESSION: No active disease. Electronically Signed   By: Marijo Conception M.D.   On: 07/24/2020 09:36    EKG: Independently reviewed.  Sinus tachycardia  Assessment/Plan Active Problems:   COPD (chronic obstructive pulmonary disease) (HCC)  (please populate well all problems here in Problem List. (For example, if patient is on BP meds at home and you resume or decide to hold them, it is a problem that needs to be her. Same for CAD, COPD, HLD and so on)  Acute metabolic encephalopathy -[id number] -Secondary to acute CO2 retention, at the time I saw the patient UDS not coming back but given patient history of polysubstance abuse and pinpointed pupils on exam, decision was made to give patient 1 dose of Narcan 0.4 mg x 1 IV. -Avoid further benzos, as needed haloperidol for agitation  Acute hypoxic and hypercapnic respiratory failure -Secondary to COPD exacerbation -Breathing treatment, avoid albuterol -Short course steroids and azithromycin  Polysubstance abuse -Haloperidol for agitation  Anxiety/depression -Continue SSRI and SNRI -Cut down  gabapentin  HLD -Continue statin  Cigarette smoker -Nicotine patch  DVT prophylaxis: Lovenox  code Status: Full code Family Communication: Husband over the phone Disposition Plan: Expect more than 2 midnight hospital stay to treat COPD Consults called: None Admission status: PCU for BiPAP, expect downgrade within 24 hours   Lequita Halt MD [facility] Pager [phone]  07/24/2020, 2:45 PM

## 2020-07-24 NOTE — ED Provider Notes (Signed)
[facility] EMERGENCY DEPARTMENT Provider Note   CSN: [id number] Arrival date & time: 07/24/20  0907     History Chief Complaint  Patient presents with  . Shortness of Breath    ELLESYN DROKE is a 55 y.o. female.  55 yo F w/ h/o COPD, HTN, HLD, T2DM, chronic back pain, polysubstance abuse who p/w shortness of breath. Pt reports 2 days of progressively worsening SOB associated with mild non-productive cough. She has a chronic runny nose but no sore throat, fevers, body aches, or sick contacts. She denies associated CP. She has been using all medications at home including inhalers with only temporary relief. EMS noted she was in the 80s on RA on their arrival, placed on O2 and gave duoneb x1, 125mg  solumedrol, and 2g Mg in route. Pt states symptoms started as her usual COPD but have been worse than they usually are. No leg swelling or weight gain.   Daughter notes that within the last month, pt got tested for COVID and was told her test was positive; got retested a week later and test was negative. No sick contacts at home.   The history is provided by the patient.  Shortness of Breath      Past Medical History:  Diagnosis Date  . Abdominal pain   . Accidental drug overdose April 2013  . Anxiety   . Atrial fibrillation ([place]) 09/29/11   converted spontaneously  . Chronic back pain   . Chronic knee pain   . Chronic nausea   . Chronic pain   . COPD (chronic obstructive pulmonary disease) ([place])   . Depression   . Diabetes mellitus    states her doctor took her off all DM meds in past month  . Diabetic neuropathy ([place])   . Dyspnea    with exertion   . GERD (gastroesophageal reflux disease)   . Headache(784.0)    migraines   . HTN (hypertension)    not on meds since in a year   . Hyperlipidemia   . Hypothyroidism    not on meds in a while   . Mental disorder    Bipolar and schizophrenic  . Requires supplemental oxygen    as needed per patient   .  Schizophrenia ([place])   . Schizophrenia, acute ([place]) 11/13/2017  . Tobacco abuse     Patient Active Problem List   Diagnosis Date Noted  . Polysubstance (excluding opioids) dependence, daily use ([place]) 08/04/2018  . Substance induced mood disorder ([place]) 08/03/2018  . Schizoaffective disorder, bipolar type ([place]) 11/13/2017  . Polysubstance abuse ([place]) 11/13/2017  . 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 Abdominal pain 04/18/2016  . Dehydration 04/17/2016  . Morbid obesity ([place]) 03/25/2016  . Type 2 diabetes mellitus with diabetic polyneuropathy, with long-term current use of insulin ([place]) 09/06/2015  . Vaginitis and vulvovaginitis, unspecified 04/19/2013  . BV (bacterial vaginosis) 04/19/2013  . HTN (hypertension) 11/04/2011  . Hypokalemia 10/02/2011  . atrial fibrillation 09/30/2011  . Acute respiratory failure ([place]) 09/29/2011  . COPD (chronic obstructive pulmonary disease) ([place]) 09/29/2011  . Accidental drug overdose 09/29/2011    Past Surgical History:  Procedure Laterality Date  . ABDOMINAL HYSTERECTOMY    . BLADDER SUSPENSION  03/04/2011   Procedure: [facility] PROCEDURE;  Surgeon: Elayne Snare MacDiarmid;  Location: [place] [facility];  Service: Urology;  Laterality: N/A;  . CYSTOCELE REPAIR  03/04/2011   Procedure: ANTERIOR REPAIR (CYSTOCELE);  Surgeon: Reece Packer;  Location:  [facility] [facility];  Service: Urology;  Laterality: N/A;  . CYSTOSCOPY  03/04/2011   Procedure: CYSTOSCOPY;  Surgeon: Elayne Snare MacDiarmid;  Location: [facility];  Service: Urology;  Laterality: N/A;  . ESOPHAGOGASTRODUODENOSCOPY (EGD) WITH PROPOFOL N/A 05/12/2017   Procedure: ESOPHAGOGASTRODUODENOSCOPY (EGD) WITH PROPOFOL;  Surgeon: Alphonsa Overall, MD;  Location: [facility];  Service: General;  Laterality: N/A;  . GASTRIC ROUX-EN-Y N/A 03/25/2016   Procedure: LAPAROSCOPIC ROUX-EN-Y GASTRIC BYPASS WITH UPPER ENDOSCOPY;  Surgeon: Excell Seltzer, MD;  Location: [facility];  Service: General;   Laterality: N/A;  . KNEE SURGERY    . LAPAROSCOPIC ASSISTED VAGINAL HYSTERECTOMY  03/04/2011   Procedure: LAPAROSCOPIC ASSISTED VAGINAL HYSTERECTOMY;  Surgeon: Cyril Mourning, MD;  Location: [facility];  Service: Gynecology;  Laterality: N/A;     OB History    Gravida  3   Para  3   Term      Preterm      AB  0   Living  3     SAB  0   IAB  0   Ectopic  0   Multiple  0   Live Births              Family History  Problem Relation Age of Onset  . Heart attack Father        25s  . Diabetes Mother   . Heart disease Mother   . Hypertension Mother   . Heart attack Sister        72  . COPD Other   . Breast cancer Neg Hx     Social History   Tobacco Use  . Smoking status: Current Every Day Smoker    Packs/day: 0.50    Types: Cigarettes  . Smokeless tobacco: Never Used  Vaping Use  . Vaping Use: Never used  Substance Use Topics  . Alcohol use: Yes    Comment: daily  . Drug use: Yes    Types: Cocaine, Marijuana, "Crack" cocaine    Home Medications Prior to Admission medications   Medication Sig Start Date End Date Taking? Authorizing Provider  ABILIFY MAINTENA 400 MG PRSY prefilled syringe Inject 400 mg as directed every 28 (twenty-eight) days. 07/12/18   [provider]  albuterol (PROAIR HFA) 108 (90 Base) MCG/ACT inhaler Inhale 2 puffs into the lungs every 6 (six) hours as needed for wheezing or shortness of breath. 11/16/17   Lindell Spar I, NP  amantadine (SYMMETREL) 100 MG capsule Take 1 capsule (100 mg total) by mouth 2 (two) times daily. 08/06/18   Johnn Hai, MD  cyclobenzaprine (FLEXERIL) 5 MG tablet Take 1-2 tablets (5-10 mg total) by mouth 3 (three) times daily as needed for muscle spasms. 04/20/20   Augusto Gamble B, NP  diclofenac sodium (VOLTAREN) 1 % GEL Apply 4 g topically 4 (four) times daily as needed (for pain). Arthritis 11/16/17   Lindell Spar I, NP  FLUoxetine (PROZAC) 20 MG capsule Take 1 capsule (20 mg total) by mouth daily.  08/07/18   Johnn Hai, MD  gabapentin (NEURONTIN) 300 MG capsule Take 1 capsule (300 mg total) by mouth 3 (three) times daily. 08/06/18 08/06/19  Johnn Hai, MD  ipratropium-albuterol (DUONEB) 0.5-2.5 (3) MG/3ML SOLN Take 3 mLs by nebulization every 6 (six) hours as needed for wheezing or shortness of breath. 06/06/18   [provider]  lamoTRIgine (LAMICTAL) 25 MG tablet Take 1 tablet (25 mg total) by mouth 2 (two) times daily. D/c if rash seen 08/06/18   Jake Samples,  Aaron Edelman, MD  lidocaine (LIDODERM) 5 % Place 1 patch onto the skin daily. Remove & Discard patch within 12 hours or as directed by MD: For pain management 11/17/17   Lindell Spar I, NP  nicotine (NICODERM CQ - DOSED IN MG/24 HOURS) 21 mg/24hr patch Place 1 patch (21 mg total) onto the skin daily. (May purchase from over the counter): For nicotine withdrawal symptoms Patient not taking: Reported on 08/03/2018 11/17/17   Lindell Spar I, NP  pantoprazole (PROTONIX) 40 MG tablet Take 1 tablet (40 mg total) by mouth 2 (two) times daily as needed (indigestion). 11/16/17   Lindell Spar I, NP  simvastatin (ZOCOR) 20 MG tablet Take 1 tablet (20 mg total) by mouth daily. For high cholesterol Patient not taking: Reported on 08/03/2018 11/16/17   Lindell Spar I, NP  SYMBICORT 160-4.5 MCG/ACT inhaler Inhale 2 puffs into the lungs daily as needed (sob and wheezing). 11/16/17   Lindell Spar I, NP  tiotropium (SPIRIVA HANDIHALER) 18 MCG inhalation capsule Place 1 capsule (18 mcg total) into inhaler and inhale daily. For COPD 11/17/17   Lindell Spar I, NP  traZODone (DESYREL) 50 MG tablet Take 1 tablet (50 mg total) by mouth at bedtime as needed for sleep. Patient not taking: Reported on 08/03/2018 11/16/17   Lindell Spar I, NP    Allergies    Morphine and related and Aspirin  Review of Systems   Review of Systems  Respiratory: Positive for shortness of breath.    All other systems reviewed and are negative except that which was mentioned in  HPI  Physical Exam Updated Vital Signs BP 136/70   Pulse (!) 112   Temp 98.3 F (36.8 C) (Oral)   Resp (!) 24   Ht 5\' 2"  (1.575 m)   Wt 45.4 kg   LMP 01/08/2011   SpO2 97%   BMI 18.29 kg/m   Physical Exam Vitals and nursing note reviewed.  Constitutional:      General: She is in acute distress.     Appearance: Normal appearance. She is ill-appearing.  HENT:     Head: Normocephalic and atraumatic.  Eyes:     Conjunctiva/sclera: Conjunctivae normal.  Cardiovascular:     Rate and Rhythm: Regular rhythm. Tachycardia present.     Heart sounds: Normal heart sounds. No murmur heard.   Pulmonary:     Effort: Tachypnea, accessory muscle usage and respiratory distress (mild) present.     Breath sounds: Wheezing present.     Comments: Increased work of breathing w/ tachypnea and mild respiratory distress, diffuse wheezes b/l, equal air movement Abdominal:     General: Abdomen is flat. Bowel sounds are normal. There is no distension.     Palpations: Abdomen is soft.     Tenderness: There is no abdominal tenderness.  Musculoskeletal:     Right lower leg: No edema.     Left lower leg: No edema.  Skin:    General: Skin is warm and dry.  Neurological:     Mental Status: She is alert and oriented to person, place, and time.     Comments: fluent  Psychiatric:        Mood and Affect: Mood normal.        Behavior: Behavior normal.     ED Results / Procedures / Treatments   Labs (all labs ordered are listed, but only abnormal results are displayed) Labs Reviewed  BASIC METABOLIC PANEL - Abnormal; Notable for the following components:  Result Value   Sodium 133 (*)    Chloride 97 (*)    Glucose, Bld 199 (*)    Calcium 8.6 (*)    All other components within normal limits  CBC - Abnormal; Notable for the following components:   Hemoglobin 10.5 (*)    HCT 35.0 (*)    MCV 71.9 (*)    MCH 21.6 (*)    RDW 19.2 (*)    All other components within normal limits  I-STAT  VENOUS BLOOD GAS, ED - Abnormal; Notable for the following components:   Bicarbonate 28.1 (*)    Acid-Base Excess 3.0 (*)    Calcium, Ion 1.00 (*)    HCT 35.0 (*)    Hemoglobin 11.9 (*)    All other components within normal limits  I-STAT VENOUS BLOOD GAS, ED - Abnormal; Notable for the following components:   pCO2, Ven 67.2 (*)    pO2, Ven 111.0 (*)    Bicarbonate 32.4 (*)    TCO2 34 (*)    Acid-Base Excess 4.0 (*)    Calcium, Ion 1.11 (*)    HCT 34.0 (*)    Hemoglobin 11.6 (*)    All other components within normal limits  SARS CORONAVIRUS 2 BY RT PCR (HOSPITAL ORDER, [facility])  D-DIMER, QUANTITATIVE (NOT AT [facility])  RAPID URINE DRUG SCREEN, HOSP PERFORMED  POC SARS CORONAVIRUS 2 AG -  ED    EKG EKG Interpretation  Date/Time:  Tuesday July 24 2020 09:10:16 EST Ventricular Rate:  116 PR Interval:    QRS Duration: 86 QT Interval:  300 QTC Calculation: 417 R Axis:   74 Text Interpretation: Sinus tachycardia Biatrial enlargement No significant change since last tracing Confirmed by Theotis Burrow [phone]) on 07/24/2020 9:13:20 AM   Radiology DG Chest Portable 1 View  Result Date: 07/24/2020 CLINICAL DATA:  Shortness of breath. EXAM: PORTABLE CHEST 1 VIEW COMPARISON:  April 20, 2020. FINDINGS: The heart size and mediastinal contours are within normal limits. Both lungs are clear. No pneumothorax or pleural effusion is noted. The visualized skeletal structures are unremarkable. IMPRESSION: No active disease. Electronically Signed   By: Marijo Conception M.D.   On: 07/24/2020 09:36    Procedures .Critical Care Performed by: Sharlett Iles, MD Authorized by: Sharlett Iles, MD   Critical care provider statement:    Critical care time (minutes):  45   Critical care time was exclusive of:  Separately billable procedures and treating other patients   Critical care was necessary to treat or prevent imminent or life-threatening  deterioration of the following conditions:  Respiratory failure   Critical care was time spent personally by me on the following activities:  Development of treatment plan with patient or surrogate, evaluation of patient's response to treatment, examination of patient, obtaining history from patient or surrogate, ordering and review of laboratory studies, ordering and performing treatments and interventions, ordering and review of radiographic studies and re-evaluation of patient's condition     Medications Ordered in ED Medications  albuterol (PROVENTIL,VENTOLIN) solution continuous neb (0 mg/hr Nebulization Stopped 07/24/20 1208)  levalbuterol (XOPENEX) nebulizer solution 1.25 mg (has no administration in time range)  LORazepam (ATIVAN) injection 0.5 mg (0.5 mg Intravenous Given 07/24/20 1038)  naloxone (NARCAN) injection 0.4 mg (0.4 mg Intravenous Given 07/24/20 1324)    ED Course  I have reviewed the triage vital signs and the nursing notes.  Pertinent labs & imaging results that were available during my care  of the patient were reviewed by me and considered in my medical decision making (see chart for details).    MDM Rules/Calculators/A&P                          Pt w/ diffuse wheezing on my exam, requiring 1-2L supplemental O2. No signs/sx volume overload/CHF. COVID negative, sounds like she may have recently cleared the virus. CXR clear. Gave 1h continuous albuterol, received steroids PTA. Labs show normal WBC count, normal creatinine, COVID antigen negative w/ PCR pending, initial VBG 7.40/45/36. She still had significant wheezing and mild respiratory distress after 1st hour of continuous thus ordered a 2nd hour. During 2nd hour, she became agitated and very anxious/jittery. Gave small dose of ativan which helped.  She was sleepy on reassessment, in part due to ativan, on room air her sats dropped to 82% on RA and normalized on 3L [place] which is a new oxygen requirement for her. I have added  a d-dimer to evaluate for PE although her wheezing on exam and moderate improvement after albuterol suggest COPD exacerbation.   Discussed admission w/ [facility], Dr. Roosevelt Locks. I ordered a repeat VBG which shows some hypercarbia at 67, pH 7.3. pt hyperoxygenated likely 2/2 to nurse turning O2 to 6L [place]. Will place on bipap to correct mild hypercarbia and improve ventilation while avoiding hyperoxygenation. Pt admitted for further treatment w/ d-dimer pending.  Final Clinical Impression(s) / ED Diagnoses Final diagnoses:  COPD exacerbation ([place])  Acute respiratory failure with hypoxia [facility])    Rx / DC Orders ED Discharge Orders    None       Jaclene Bartelt, Wenda Overland, MD 07/24/20 1336

## 2020-07-24 NOTE — Progress Notes (Signed)
Per nsg pt is not medically ready, on bipap and was on non rebreather coming in with EMS.  Hold visit and re-attempt tomorrow if able.  07/24/20 1500  PT Visit Information  Reason Eval/Treat Not Completed Medical issues which prohibited therapy    Mee Hives, PT MS Acute Rehab Dept. Number: [place] and [place]

## 2020-07-24 NOTE — ED Notes (Addendum)
Repeatedly trying to get pt to stay awake and take deep breaths. Pt is on 1L [place] sating 85-89%. RT is on the way with BiPAP

## 2020-07-24 NOTE — ED Notes (Signed)
D/t pt inability to follow commands to breathe in her nose to obtain O2. [place] placed in pt's mouth at 6L.

## 2020-07-24 NOTE — ED Notes (Signed)
MD at bedside to lay eyes on pt.

## 2020-07-24 NOTE — ED Triage Notes (Signed)
Patient BIB [facility] from home. Complaint of increased shortness of breath for 24 hours. Patient with hx of COPD. Patient tried several at home nebulizer treatments with no improvement. Per EMS SpO2 in the 80s RA upon arrival. Patietn received 1 duoneb, 125mg  solu-medrol and 2 g mag iv via EMS. Patient presents with bilateral wheezes. VSS.

## 2020-07-24 NOTE — ED Notes (Signed)
Dinner Tray Ordered @ 1651. 

## 2020-07-24 NOTE — ED Notes (Signed)
Pt now 98% on 6L so decreased back to 3L. Pt maintaining 96% at this time. Pt is still very drowsy and labored and using accessory muscles and tachypneic. RT is on the way

## 2020-07-25 DIAGNOSIS — J9621 Acute and chronic respiratory failure with hypoxia: Secondary | ICD-10-CM

## 2020-07-25 DIAGNOSIS — D509 Iron deficiency anemia, unspecified: Secondary | ICD-10-CM

## 2020-07-25 LAB — BASIC METABOLIC PANEL
Anion gap: 10 (ref 5–15)
BUN: 9 mg/dL (ref 6–20)
CO2: 26 mmol/L (ref 22–32)
Calcium: 8.7 mg/dL — ABNORMAL LOW (ref 8.9–10.3)
Chloride: 101 mmol/L (ref 98–111)
Creatinine, Ser: 0.57 mg/dL (ref 0.44–1.00)
GFR, Estimated: >60 mL/min (ref >60)
Glucose, Bld: 113 mg/dL — ABNORMAL HIGH (ref 70–99)
Potassium: 4.5 mmol/L (ref 3.5–5.1)
Sodium: 137 mmol/L (ref 135–145)

## 2020-07-25 LAB — HEMOGLOBIN A1C
Hgb A1c MFr Bld: 7.6 % — ABNORMAL HIGH (ref 4.8–5.6)
Mean Plasma Glucose: 171.42 mg/dL

## 2020-07-25 LAB — CBG MONITORING, ED
Glucose-Capillary: 293 mg/dL — ABNORMAL HIGH (ref 70–99)
Glucose-Capillary: 209 mg/dL — ABNORMAL HIGH (ref 70–99)
Glucose-Capillary: 135 mg/dL — ABNORMAL HIGH (ref 70–99)

## 2020-07-25 LAB — CBC
HCT: 34.6 % — ABNORMAL LOW (ref 36.0–46.0)
Hemoglobin: 9.9 g/dL — ABNORMAL LOW (ref 12.0–15.0)
MCH: 21 pg — ABNORMAL LOW (ref 26.0–34.0)
MCHC: 28.6 g/dL — ABNORMAL LOW (ref 30.0–36.0)
MCV: 73.5 fL — ABNORMAL LOW (ref 80.0–100.0)
Platelets: 304 10*3/uL (ref 150–400)
RBC: 4.71 MIL/uL (ref 3.87–5.11)
RDW: 18.8 % — ABNORMAL HIGH (ref 11.5–15.5)
WBC: 18.6 10*3/uL — ABNORMAL HIGH (ref 4.0–10.5)
nRBC: 0 % (ref 0.0–0.2)

## 2020-07-25 LAB — GLUCOSE, CAPILLARY: Glucose-Capillary: 179 mg/dL — ABNORMAL HIGH (ref 70–99)

## 2020-07-25 LAB — HIV ANTIBODY (ROUTINE TESTING W REFLEX): HIV Screen 4th Generation wRfx: NONREACTIVE

## 2020-07-25 MED ORDER — GUAIFENESIN ER 600 MG PO TB12
600.0000 mg | ORAL_TABLET | Freq: Two times a day (BID) | ORAL | Status: DC
  Administered 2020-07-25 – 2020-07-27 (×5): 600 mg via ORAL
  Filled 2020-07-25 (×5): qty 1

## 2020-07-25 MED ORDER — ALBUTEROL SULFATE (2.5 MG/3ML) 0.083% IN NEBU
3.0000 mL | INHALATION_SOLUTION | Freq: Four times a day (QID) | RESPIRATORY_TRACT | Status: DC | PRN
Start: 2020-07-25 — End: 2020-07-27

## 2020-07-25 MED ORDER — SODIUM CHLORIDE 0.9 % IV SOLN: INTRAVENOUS | Status: AC

## 2020-07-25 NOTE — Progress Notes (Signed)
PROGRESS NOTE    [name]  EPP:[id number] DOB: [birth year] DOA: 07/24/2020 PCP: [name], MD    Chief Complaint  Patient presents with  . Shortness of Breath    Brief Narrative:  [name] is a 55 y.o. female with medical history significant of asthma/COPD, cocaine abuse, anxiety depression, HLD, presented with new onset of shortness of breath for 1 day.  Also have acute metabolic encephalopathy on presentation   Subjective:  She is fully alert, pleasant today Continue to have wheezing, report productive cough, no edema, no fever  Assessment & Plan:   Active Problems:   COPD exacerbation (HCC)   COPD exacerbation/acute hypercapnic and hypoxic respiratory failure/acute metabolic encephalopathy -Initially needed BiPAP, now on nasal cannula, continue to have tachycardia and tachypnea, but report improvement -Chest x-ray personally reviewed no acute process -SARS-CoV-2 screening negative -D-dimer 0.39 -On IV Solu-Medrol, nebulizer, Zithromax, will continue, wean oxygen as able  Leukocytosis Likely due to steroid Monitor  Microcytic anemia Hemoglobin 9.9/MCV 73.5 -Denies external blood loss -Anemia work-up  Noninsulin-dependent type 2 diabetes A1c 7.6 A.m. fasting blood glucose 113 Expect hyperglycemia with steroid use, start SSI  Hyperlipidemia Continue statin  Chronic pain On Neurontin, Flexeril, Voltaren gel  Psych: Home medication Abilify/Lamictal/trazodone/Neurontin/Prozac Denies SI/ HI  Polysubstance abuse UDS positive for cocaine Per previous psychiatry notes patient is on amantadine for cocaine craving Also have history of alcohol use  Currently everyday smoker Nicotine patch provided  Body mass index is 18.29 kg/m. History of gastric bypass surgery in 2017   DVT prophylaxis: enoxaparin (LOVENOX) injection 40 mg Start: 07/24/20 1400   Code Status: Full Family Communication: Patient Disposition:   Status is:  Inpatient   Dispo: The patient is from: Home              Anticipated d/c is to: Home              Anticipated d/c date is: To be determined, continue to have wheezing, tachypnea                Consultants:   None  Procedures:   BiPAP  Antimicrobials:   Zithromax     Objective: Vitals:   07/25/20 0530 07/25/20 0545 07/25/20 0600 07/25/20 0700  BP: 113/75 115/76 117/66 110/69  Pulse: 87 85 86 92  Resp: (!) 21 (!) 24  (!) 25  Temp:      TempSrc:      SpO2: 98% 100% 100% 100%  Weight:      Height:        Intake/Output Summary (Last 24 hours) at 07/25/2020 0849 Last data filed at 07/24/2020 1812 Gross per 24 hour  Intake 250.04 ml  Output --  Net 250.04 ml   Filed Weights   07/24/20 0913  Weight: 45.4 kg    Examination:  General exam: calm, NAD Respiratory system: Diffuse bilateral wheezing, tachypnea, able to talk in full sentences Cardiovascular system: Tachycardia , no murmur, No pedal edema. Gastrointestinal system: Abdomen is nondistended, soft and nontender. Normal bowel sounds heard. Central nervous system: Alert and oriented. No focal neurological deficits. Extremities: Symmetric 5 x 5 power. Skin: No rashes, lesions or ulcers Psychiatry: Judgement and insight appear normal. Mood & affect appropriate.     Data Reviewed: I have personally reviewed following labs and imaging studies  CBC: Recent Labs  Lab 07/24/20 0915 07/24/20 1038 07/24/20 1255 07/24/20 2045 07/25/20 0405  WBC 9.4  --   --   --  18.6*  HGB 10.5* 11.9* 11.6* 11.6* 9.9*  HCT 35.0* 35.0* 34.0* 34.0* 34.6*  MCV 71.9*  --   --   --  73.5*  PLT 337  --   --   --  123456    Basic Metabolic Panel: Recent Labs  Lab 07/24/20 0915 07/24/20 1038 07/24/20 1255 07/24/20 2045 07/25/20 0405  NA 133* 137 138 138 137  K 4.8 4.0 3.9 4.0 4.5  CL 97*  --   --   --  101  CO2 25  --   --   --  26  GLUCOSE 199*  --   --   --  113*  BUN 6  --   --   --  9  CREATININE 0.64  --   --   --   0.57  CALCIUM 8.6*  --   --   --  8.7*    GFR: Estimated Creatinine Clearance: 56.3 mL/min (by C-G formula based on SCr of 0.57 mg/dL).  Liver Function Tests: No results for input(s): AST, ALT, ALKPHOS, BILITOT, PROT, ALBUMIN in the last 168 hours.  CBG: Recent Labs  Lab 07/24/20 1742 07/25/20 0832  GLUCAP 190* 293*     Recent Results (from the past 240 hour(s))  SARS Coronavirus 2 by RT PCR (hospital order, performed in [facility] hospital lab) Nasopharyngeal Nasopharyngeal Swab     Status: None   Collection Time: 07/24/20 12:46 PM   Specimen: Nasopharyngeal Swab  Result Value Ref Range Status   SARS Coronavirus 2 NEGATIVE NEGATIVE Final    Comment: (NOTE) SARS-CoV-2 target nucleic acids are NOT DETECTED.  The SARS-CoV-2 RNA is generally detectable in upper and lower respiratory specimens during the acute phase of infection. The lowest concentration of SARS-CoV-2 viral copies this assay can detect is 250 copies / mL. A negative result does not preclude SARS-CoV-2 infection and should not be used as the sole basis for treatment or other patient management decisions.  A negative result may occur with improper specimen collection / handling, submission of specimen other than nasopharyngeal swab, presence of viral mutation(s) within the areas targeted by this assay, and inadequate number of viral copies (<250 copies / mL). A negative result must be combined with clinical observations, patient history, and epidemiological information.  Fact Sheet for Patients:   StrictlyIdeas.no  Fact Sheet for Healthcare Providers: BankingDealers.co.za  This test is not yet approved or  cleared by the [place] FDA and has been authorized for detection and/or diagnosis of SARS-CoV-2 by FDA under an Emergency Use Authorization (EUA).  This EUA will remain in effect (meaning this test can be used) for the duration of the COVID-19  declaration under Section 564(b)(1) of the Act, 21 U.S.C. section 360bbb-3(b)(1), unless the authorization is terminated or revoked sooner.  Performed at [facility], [place] [address], [place], [place] [address]          Radiology Studies: DG Chest Portable 1 View  Result Date: 07/24/2020 CLINICAL DATA:  Shortness of breath. EXAM: PORTABLE CHEST 1 VIEW COMPARISON:  April 20, 2020. FINDINGS: The heart size and mediastinal contours are within normal limits. Both lungs are clear. No pneumothorax or pleural effusion is noted. The visualized skeletal structures are unremarkable. IMPRESSION: No active disease. Electronically Signed   By: Marijo Conception M.D.   On: 07/24/2020 09:36        Scheduled Meds: . azithromycin  500 mg Oral Daily  . enoxaparin (LOVENOX) injection  40 mg Subcutaneous Q24H  .  FLUoxetine  20 mg Oral Daily  . fluticasone furoate-vilanterol  1 puff Inhalation Daily  . gabapentin  100 mg Oral TID  . insulin aspart  0-15 Units Subcutaneous TID WC  . lamoTRIgine  25 mg Oral BID  . levalbuterol  1.25 mg Nebulization Q6H  . methylPREDNISolone (SOLU-MEDROL) injection  40 mg Intravenous Q6H  . nicotine  21 mg Transdermal Daily  . umeclidinium bromide  1 puff Inhalation Daily   Continuous Infusions:   LOS: 1 day   Time spent: 7mins Greater than 50% of this time was spent in counseling, explanation of diagnosis, planning of further management, and coordination of care.  I have personally reviewed and interpreted on  07/25/2020 daily labs, tele strips, imagings as discussed above under date review session and assessment and plans.  I reviewed all nursing notes, pharmacy notes,  vitals, pertinent old records  I have discussed plan of care as described above with RN , patient  on 07/25/2020  Voice Recognition /Dragon dictation system was used to create this note, attempts have been made to correct errors. Please contact the author with questions and/or  clarifications.   Florencia Reasons, MD PhD FACP [facility]  Available via Epic secure chat 7am-7pm for nonurgent issues Please page for urgent issues To page the attending provider between 7A-7P or the covering provider during after hours 7P-7A, please log into the web site [email or web address] and access using universal [place] password for that web site. If you do not have the password, please call the hospital operator.    07/25/2020, 8:49 AM

## 2020-07-25 NOTE — ED Notes (Signed)
Breakfast Ordered 

## 2020-07-25 NOTE — Progress Notes (Signed)
Pt is up to side of bed today, and using 6L O2 was able to maintain her sats on the hallway walk.  Pt is mainly fatigued from her illness but is also having issues recalling events of last 48 hours.  Pt will be followed along for therapy to increase endurance and monitor O2 needs with mobility, esp since she was using O2 on PRN basis previous to this.  Follow for goals of acute PT below.  07/25/20 1200  PT Visit Information  Last PT Received On 07/25/20  Assistance Needed +1  History of Present Illness [name] is a 55 y.o. female with medical history significant of asthma/COPD, cocaine abuse, anxiety depression, HLD, presented with new onset of shortness of breath for 1 day and recent cocaine use.  Precautions  Precautions Fall;Other (comment)  Precaution Comments monitor sats, needs O2  Restrictions  Weight Bearing Restrictions No  Home Living  Family/patient expects to be discharged to: Private residence  Living Arrangements Other relatives  Available Help at Discharge Family;Available PRN/intermittently  Type of Home House  Home Access Stairs to enter  Entrance Stairs-Number of Steps 6  Entrance Stairs-Rails Can reach both  Home Layout One level  Bathroom Shower/Tub Walk-in Armed forces technical officer - single point  Additional Comments used SPC for back pain managemetn  Prior Function  Level of Independence Needs assistance  Gait / Transfers Assistance Needed SPC for back pain only, PRN O2 use  ADL's / [place] cooking and cleaning assist; cousin assists  Communication  Communication No difficulties  Pain Assessment  Pain Assessment Faces  Faces Pain Scale 2  Pain Location back  Cognition  Arousal/Alertness Awake/alert  Behavior During Therapy WFL for tasks assessed/performed  Overall Cognitive Status No family/caregiver present to determine baseline cognitive functioning  General Comments hesitant about history answers  and reports she does not remember what happened yesterday  Upper Extremity Assessment  Upper Extremity Assessment Defer to OT evaluation  Lower Extremity Assessment  Lower Extremity Assessment Generalized weakness (mild weakness, 4/5 throughout)  Cervical / Trunk Assessment  Cervical / Trunk Assessment Normal  Bed Mobility  Overal bed mobility Needs Assistance  Bed Mobility Supine to Sit;Sit to Supine  Supine to sit Min assist  Sit to supine Supervision  Transfers  Overall transfer level Needs assistance  Equipment used None  Transfers Sit to/from Stand  Sit to Stand Min guard (for safety and lines)  Ambulation/Gait  Ambulation/Gait assistance Min guard  Gait Distance (Feet) 100 Feet  Assistive device 1 person hand held assist  Gait Pattern/deviations Step-through pattern;Wide base of support;Decreased stride length;Drifts right/left (mild drift)  General Gait Details SOB with effort but O2 sats were 93% at lowest  Gait velocity reduced  Gait velocity interpretation <1.31 ft/sec, indicative of household ambulator  Balance  Overall balance assessment Modified Independent  General Comments  General comments (skin integrity, edema, etc.) pt is up to side of bed on O2, noted her sats were 96% and maintained on O2 to 90+%  Exercises  Exercises Other exercises (LE strength 4/5 hips and knees and ankles 4 to 4+)  PT - End of Session  Equipment Utilized During Treatment Gait belt  Activity Tolerance Patient limited by fatigue;Patient limited by lethargy  Patient left in bed;with call bell/phone within reach  Nurse Communication Mobility status  PT Assessment  PT Recommendation/Assessment Patient needs continued PT services  PT Visit Diagnosis Unsteadiness on feet (R26.81)  PT Problem List Decreased  strength;Decreased mobility;Decreased activity tolerance;Cardiopulmonary status limiting activity  Barriers to Discharge Inaccessible home environment  Barriers to Discharge Comments  has stairs to enter house  PT Plan  PT Frequency (ACUTE ONLY) Min 3X/week  PT Treatment/Interventions (ACUTE ONLY) DME instruction;Gait training;Stair training;Functional mobility training;Therapeutic activities;Therapeutic exercise;Balance training;Neuromuscular re-education;Patient/family education  AM-PAC PT "6 Clicks" Mobility Outcome Measure (Version 2)  Help needed turning from your back to your side while in a flat bed without using bedrails? 4  Help needed moving from lying on your back to sitting on the side of a flat bed without using bedrails? 3  Help needed moving to and from a bed to a chair (including a wheelchair)? 3  Help needed standing up from a chair using your arms (e.g., wheelchair or bedside chair)? 3  Help needed to walk in hospital room? 3  Help needed climbing 3-5 steps with a railing?  3  6 Click Score 19  Consider Recommendation of Discharge To: Home with [facility]  PT Recommendation  Follow Up Recommendations Home health PT;Supervision for mobility/OOB  PT equipment None recommended by PT  Individuals Consulted  Consulted and Agree with Results and Recommendations Patient  Acute Rehab PT Goals  Patient Stated Goal to get breathing better and get home  PT Goal Formulation With patient  Time For Goal Achievement 08/08/20  Potential to Achieve Goals Good  PT Time Calculation  PT Start Time (ACUTE ONLY) 1035  PT Stop Time (ACUTE ONLY) 1103  PT Time Calculation (min) (ACUTE ONLY) 28 min  PT General Charges  $$ ACUTE PT VISIT 1 Visit  PT Evaluation  $PT Eval Moderate Complexity 1 Mod  PT Treatments  $Gait Training 8-22 mins  Written Expression  Dominant Hand Right    Mee Hives, PT MS Acute Rehab Dept. Number: [place]

## 2020-07-25 NOTE — Evaluation (Signed)
Occupational Therapy Evaluation Patient Details Name: [name] MRN: [id number] DOB: [birth year] Today's Date: 07/25/2020    History of Present Illness [name] is a 55 y.o. female with medical history significant of asthma/COPD, cocaine abuse, anxiety depression, HLD, presented with new onset of shortness of breath for 1 day and recent cocaine use.   Clinical Impression   Pt PTA: Pt living at home independently with ADL and mobility. Pt reports living with a cousin who helps her. Pt currently, now supervisionA level for ADL due to increased O2 need and coughing spells. Pt ambulating to bathroom ~100' with no AD and superivsionA Pt's SpO2 5L O2 >90% with exertion; 6L O2 with portable tank >83% with exertion and quickly resumes >90% on 6L O2. Pt with questionable cognition and requires 24/7 for initial time at home. Pt does not require continued OT skilled services as pt close to baseline functioning and energy conservation strategies discussed. OT signing off.    Follow Up Recommendations  No OT follow up;Supervision/Assistance - 24 hour    Equipment Recommendations  None recommended by OT    Recommendations for Other Services       Precautions / Restrictions Precautions Precautions: Fall;Other (comment) Precaution Comments: watch O2 Restrictions Weight Bearing Restrictions: No      Mobility Bed Mobility Overal bed mobility: Modified Independent             General bed mobility comments: no physical assist    Transfers Overall transfer level: Needs assistance Equipment used: None Transfers: Sit to/from Stand Sit to Stand: Supervision         General transfer comment: for safety only    Balance Overall balance assessment: No apparent balance deficits (not formally assessed)                                         ADL either performed or assessed with clinical judgement   ADL Overall ADL's : At baseline                                        General ADL Comments: Pt now supervisionA level for ADL due to increased O2 need and coughing spells. Pt reports having a "cousin" staying with her at home and daughter, CSW works here. Pt reports that her and her spouse are separated. Pt ambulating to bathroom ~100' with no AD. Pt does not require physical assist.     Vision Baseline Vision/History: No visual deficits Patient Visual Report: No change from baseline Vision Assessment?: No apparent visual deficits     Perception     Praxis      Pertinent Vitals/Pain Pain Assessment: 0-10 Pain Score: 6  Pain Location: back Pain Descriptors / Indicators: Sharp Pain Intervention(s): Monitored during session     Hand Dominance Right   Extremity/Trunk Assessment Upper Extremity Assessment Upper Extremity Assessment: Generalized weakness   Lower Extremity Assessment Lower Extremity Assessment: Generalized weakness   Cervical / Trunk Assessment Cervical / Trunk Assessment: Normal   Communication Communication Communication: No difficulties   Cognition Arousal/Alertness: Awake/alert Behavior During Therapy: WFL for tasks assessed/performed Overall Cognitive Status: No family/caregiver present to determine baseline cognitive functioning  General Comments: Pt able to follow all commands, but said who was living with her differently throughout session- first it was "no little ones" and then it was "my cousin stays with me."   General Comments  5L O2 >90% with exertion; 6L O2 with portable tank >83% with exertion and quickly resumes >90% on 6L O2.    Exercises     Shoulder Instructions      Home Living Family/patient expects to be discharged to:: Private residence Living Arrangements: Other relatives Available Help at Discharge: Family;Available PRN/intermittently Type of Home: House Home Access: Stairs to enter [employer] of Steps:  6 Entrance Stairs-Rails: Can reach both Home Layout: One level     Bathroom Shower/Tub: Occupational psychologist: Standard     Home Equipment: Cane - single point          Prior Functioning/Environment Level of Independence: Needs assistance  Gait / Transfers Assistance Needed: No AD usually; has SPC when back is hurting ADL's / Homemaking Assistance Needed: cooking and cleaning assist; cousin assists            OT Problem List: Decreased cognition      OT Treatment/Interventions: Self-care/ADL training;Therapeutic exercise;Energy conservation;Therapeutic activities;Cognitive remediation/compensation;Visual/perceptual remediation/compensation;Patient/family education;Balance training    OT Goals(Current goals can be found in the care plan section) Acute Rehab OT Goals Patient Stated Goal: "I want to go home" OT Goal Formulation: With patient/family Time For Goal Achievement: 08/08/20 Potential to Achieve Goals: Good  OT Frequency: Min 2X/week   Barriers to D/C:            Co-evaluation              AM-PAC OT "6 Clicks" Daily Activity     Outcome Measure Help from another person eating meals?: None Help from another person taking care of personal grooming?: A Little Help from another person toileting, which includes using toliet, bedpan, or urinal?: A Little Help from another person bathing (including washing, rinsing, drying)?: A Little Help from another person to put on and taking off regular upper body clothing?: None Help from another person to put on and taking off regular lower body clothing?: A Little 6 Click Score: 20   End of Session Equipment Utilized During Treatment: Oxygen Nurse Communication: Mobility status  Activity Tolerance: Patient tolerated treatment well Patient left: in bed;with call bell/phone within reach  OT Visit Diagnosis: Unsteadiness on feet (R26.81);Muscle weakness (generalized) (M62.81)                Time:  8937-3428 OT Time Calculation (min): 32 min Charges:  OT General Charges $OT Visit: 1 Visit OT Evaluation $OT Eval Moderate Complexity: 1 Mod OT Treatments $Self Care/Home Management : 8-22 mins  Jefferey Pica, OTR/L Acute Rehabilitation Services Pager: [phone] Office: [place] 07/25/2020, 11:28 AM

## 2020-07-26 ENCOUNTER — Encounter (HOSPITAL_COMMUNITY): Payer: Self-pay | Admitting: Internal Medicine

## 2020-07-26 LAB — GLUCOSE, CAPILLARY
Glucose-Capillary: 197 mg/dL — ABNORMAL HIGH (ref 70–99)
Glucose-Capillary: 114 mg/dL — ABNORMAL HIGH (ref 70–99)
Glucose-Capillary: 283 mg/dL — ABNORMAL HIGH (ref 70–99)
Glucose-Capillary: 214 mg/dL — ABNORMAL HIGH (ref 70–99)
Glucose-Capillary: 157 mg/dL — ABNORMAL HIGH (ref 70–99)

## 2020-07-26 LAB — CBC WITH DIFFERENTIAL/PLATELET
Abs Immature Granulocytes: 0 10*3/uL (ref 0.00–0.07)
Abs Immature Granulocytes: 0 10*3/uL (ref 0.00–0.07)
Basophils Absolute: 0 10*3/uL (ref 0.0–0.1)
Basophils Absolute: 0 10*3/uL (ref 0.0–0.1)
Basophils Relative: 0 %
Basophils Relative: 0 %
Eosinophils Absolute: 0 10*3/uL (ref 0.0–0.5)
Eosinophils Absolute: 0 10*3/uL (ref 0.0–0.5)
Eosinophils Relative: 0 %
Eosinophils Relative: 0 %
HCT: 35.2 % — ABNORMAL LOW (ref 36.0–46.0)
HCT: 30.6 % — ABNORMAL LOW (ref 36.0–46.0)
Hemoglobin: 10.4 g/dL — ABNORMAL LOW (ref 12.0–15.0)
Hemoglobin: 8.8 g/dL — ABNORMAL LOW (ref 12.0–15.0)
Lymphocytes Relative: 7 %
Lymphocytes Relative: 11 %
Lymphs Abs: 1 10*3/uL (ref 0.7–4.0)
Lymphs Abs: 1.4 10*3/uL (ref 0.7–4.0)
MCH: 21.4 pg — ABNORMAL LOW (ref 26.0–34.0)
MCH: 20.8 pg — ABNORMAL LOW (ref 26.0–34.0)
MCHC: 29.5 g/dL — ABNORMAL LOW (ref 30.0–36.0)
MCHC: 28.8 g/dL — ABNORMAL LOW (ref 30.0–36.0)
MCV: 72.4 fL — ABNORMAL LOW (ref 80.0–100.0)
MCV: 72.3 fL — ABNORMAL LOW (ref 80.0–100.0)
Monocytes Absolute: 0 10*3/uL — ABNORMAL LOW (ref 0.1–1.0)
Monocytes Absolute: 0.4 10*3/uL (ref 0.1–1.0)
Monocytes Relative: 0 %
Monocytes Relative: 3 %
Neutro Abs: 13.1 10*3/uL — ABNORMAL HIGH (ref 1.7–7.7)
Neutro Abs: 10.9 10*3/uL — ABNORMAL HIGH (ref 1.7–7.7)
Neutrophils Relative %: 93 %
Neutrophils Relative %: 86 %
Platelets: 303 10*3/uL (ref 150–400)
Platelets: 284 10*3/uL (ref 150–400)
RBC: 4.86 MIL/uL (ref 3.87–5.11)
RBC: 4.23 MIL/uL (ref 3.87–5.11)
RDW: 19.1 % — ABNORMAL HIGH (ref 11.5–15.5)
RDW: 18.6 % — ABNORMAL HIGH (ref 11.5–15.5)
WBC: 14.1 10*3/uL — ABNORMAL HIGH (ref 4.0–10.5)
WBC: 12.7 10*3/uL — ABNORMAL HIGH (ref 4.0–10.5)
nRBC: 0 % (ref 0.0–0.2)
nRBC: 0 /100 WBC
nRBC: 0 % (ref 0.0–0.2)
nRBC: 0 /100 WBC

## 2020-07-26 LAB — PROTIME-INR
INR: 0.9 (ref 0.8–1.2)
Prothrombin Time: 11.3 seconds — ABNORMAL LOW (ref 11.4–15.2)

## 2020-07-26 LAB — RETICULOCYTES
Immature Retic Fract: 20.8 % — ABNORMAL HIGH (ref 2.3–15.9)
RBC.: 4.93 MIL/uL (ref 3.87–5.11)
Retic Count, Absolute: 59.7 10*3/uL (ref 19.0–186.0)
Retic Ct Pct: 1.2 % (ref 0.4–3.1)

## 2020-07-26 LAB — IRON AND TIBC
Iron: 13 ug/dL — ABNORMAL LOW (ref 28–170)
Saturation Ratios: 3 % — ABNORMAL LOW (ref 10.4–31.8)
TIBC: 424 ug/dL (ref 250–450)
UIBC: 411 ug/dL

## 2020-07-26 LAB — COMPREHENSIVE METABOLIC PANEL
ALT: 16 U/L (ref 0–44)
AST: 25 U/L (ref 15–41)
Albumin: 3 g/dL — ABNORMAL LOW (ref 3.5–5.0)
Alkaline Phosphatase: 60 U/L (ref 38–126)
Anion gap: 9 (ref 5–15)
BUN: 11 mg/dL (ref 6–20)
CO2: 28 mmol/L (ref 22–32)
Calcium: 8.5 mg/dL — ABNORMAL LOW (ref 8.9–10.3)
Chloride: 99 mmol/L (ref 98–111)
Creatinine, Ser: 0.77 mg/dL (ref 0.44–1.00)
GFR, Estimated: >60 mL/min (ref >60)
Glucose, Bld: 282 mg/dL — ABNORMAL HIGH (ref 70–99)
Potassium: 4.6 mmol/L (ref 3.5–5.1)
Sodium: 136 mmol/L (ref 135–145)
Total Bilirubin: 0.4 mg/dL (ref 0.3–1.2)
Total Protein: 6.6 g/dL (ref 6.5–8.1)

## 2020-07-26 LAB — FERRITIN: Ferritin: 9 ng/mL — ABNORMAL LOW (ref 11–307)

## 2020-07-26 LAB — VITAMIN D 25 HYDROXY (VIT D DEFICIENCY, FRACTURES): Vit D, 25-Hydroxy: 10.55 ng/mL — ABNORMAL LOW (ref 30–100)

## 2020-07-26 LAB — FOLATE: Folate: 7.4 ng/mL (ref >5.9)

## 2020-07-26 LAB — MAGNESIUM: Magnesium: 2.2 mg/dL (ref 1.7–2.4)

## 2020-07-26 LAB — VITAMIN B12: Vitamin B-12: 392 pg/mL (ref 180–914)

## 2020-07-26 MED ORDER — SENNOSIDES-DOCUSATE SODIUM 8.6-50 MG PO TABS
1.0000 | ORAL_TABLET | Freq: Two times a day (BID) | ORAL | Status: DC
  Administered 2020-07-26 – 2020-07-27 (×2): 1 via ORAL
  Filled 2020-07-26 (×2): qty 1

## 2020-07-26 MED ORDER — CALCIUM CARBONATE ANTACID 500 MG PO CHEW
1.0000 | CHEWABLE_TABLET | Freq: Three times a day (TID) | ORAL | Status: DC
  Administered 2020-07-26 – 2020-07-27 (×3): 200 mg via ORAL
  Filled 2020-07-26 (×3): qty 1

## 2020-07-26 MED ORDER — METHYLPREDNISOLONE SODIUM SUCC 40 MG IJ SOLR
40.0000 mg | Freq: Two times a day (BID) | INTRAMUSCULAR | Status: DC
  Administered 2020-07-26 – 2020-07-27 (×2): 40 mg via INTRAVENOUS
  Filled 2020-07-26 (×2): qty 1

## 2020-07-26 MED ORDER — ALUM & MAG HYDROXIDE-SIMETH 200-200-20 MG/5ML PO SUSP
15.0000 mL | Freq: Four times a day (QID) | ORAL | Status: DC | PRN
  Administered 2020-07-26: 15 mL via ORAL
  Filled 2020-07-26: qty 30

## 2020-07-26 MED ORDER — INSULIN ASPART 100 UNIT/ML ~~LOC~~ SOLN
0.0000 [IU] | Freq: Three times a day (TID) | SUBCUTANEOUS | Status: DC
  Administered 2020-07-27: 3 [IU] via SUBCUTANEOUS

## 2020-07-26 MED ORDER — FERROUS SULFATE 325 (65 FE) MG PO TABS
325.0000 mg | ORAL_TABLET | Freq: Every day | ORAL | Status: DC
  Administered 2020-07-27: 325 mg via ORAL
  Filled 2020-07-26: qty 1

## 2020-07-26 MED ORDER — ADULT MULTIVITAMIN W/MINERALS CH
1.0000 | ORAL_TABLET | Freq: Two times a day (BID) | ORAL | Status: DC
  Administered 2020-07-26 – 2020-07-27 (×3): 1 via ORAL
  Filled 2020-07-26 (×3): qty 1

## 2020-07-26 NOTE — Progress Notes (Addendum)
Initial Nutrition Assessment  DOCUMENTATION CODES:   Underweight  INTERVENTION:   -Glucerna Shake po BID, each supplement provides 220 kcal and 10 grams of protein -Ensure Max po daily, each supplement provides 150 kcal and 30 grams of protein -MVI with minerals BID -500 mg calcium carbonate TID -Monitor labs for micronutrient deficiencies (folic acid, thiamine, copper, zinc, vitamin D, vitamin A, vitamin E, vitamin K, and calcium)  NUTRITION DIAGNOSIS:   Increased nutrient needs related to chronic illness (COPD) as evidenced by estimated needs.  GOAL:   Patient will meet greater than or equal to 90% of their needs  MONITOR:   PO intake,Supplement acceptance,Diet advancement,Labs,Weight trends,Skin,I & O's  REASON FOR ASSESSMENT:   Consult Assessment of nutrition requirement/status  ASSESSMENT:   [name] is a 55 y.o. female with medical history significant of asthma/COPD, cocaine abuse, anxiety depression, HLD, presented with new onset of shortness of breath for 1 day.  Pt admitted with COPD exacerbation and acute metabolic encephalopathy.   Reviewed I/O's: -1.4 L x 24 hours and -1/2 L since admission  UOP: 2.5 L x 24 hours  Pt unavailable at time of attempted contact.   No meal completion data available to assess at this time.   Per chart review, pt has history of roux-en-y gastric bypass in 2017. Pt has experienced progressive wt loss over the past 3 years. Due to underweight status and history of cocaine abuse, high suspect pt with malnutrition, however, unable to identify at this time.   Case discussed with Dr. Erlinda Hong. Pt is at high risk for micronutrient deficiencies and is agreeable to checking labs.   Medications reviewed and include senokot and IV solu-medrol.   Lab Results  Component Value Date   HGBA1C 7.6 (H) 07/25/2020   PTA DM medications are none.   Labs reviewed: Fe: 13. CBGS: 114-197 (inpatient orders for glycemic control are 0-15 units  insulin aspart TID with meals).   Diet Order:   Diet Order            Diet Heart Room service appropriate? Yes; Fluid consistency: Thin  Diet effective now                 EDUCATION NEEDS:   No education needs have been identified at this time  Skin:  Skin Assessment: Reviewed RN Assessment  Last BM:  Unknown  Height:   Ht Readings from Last 1 Encounters:  07/25/20 5\' 2"  (1.575 m)    Weight:   Wt Readings from Last 1 Encounters:  07/26/20 45.2 kg    Ideal Body Weight:  50 kg  BMI:  Body mass index is 18.24 kg/m.  Estimated Nutritional Needs:   Kcal:  1800-2000  Protein:  90-105 grams  Fluid:  > 1.8 L    Loistine Chance, RD, LDN, [place] Registered Dietitian II Certified Diabetes Care and Education Specialist Please refer to [facility] for RD and/or RD on-call/weekend/after hours pager

## 2020-07-26 NOTE — Progress Notes (Signed)
Physical Therapy Treatment Patient Details Name: [name] MRN: [id number] DOB: September 19, 1965 Today's Date: 07/26/2020    History of Present Illness [name] is a 55 y.o. female with medical history significant of asthma/COPD, cocaine abuse, anxiety depression, HLD, presented with new onset of shortness of breath for 1 day and recent cocaine use.    PT Comments    Discussed with pt her findings of becoming hypoxic on room air to walk on her hallway.  Pt is not feeling too SOB at rest, but baseline sitting is 87%.  Pt was able to walk on the hallway for 60' with sats down to 82%, but on 2L was 93% at the least.  Follow along for recovery of endurance, LE strength and her balance with standing and stopping with gait.  Follow up with her for education about her safety and goals of acute PT.   Follow Up Recommendations  Home health PT;Supervision for mobility/OOB     Equipment Recommendations  None recommended by PT    Recommendations for Other Services       Precautions / Restrictions Precautions Precautions: Fall;Other (comment) Precaution Comments: monitor sats, needs O2 Restrictions Weight Bearing Restrictions: No    Mobility  Bed Mobility Overal bed mobility: Needs Assistance Bed Mobility: Supine to Sit;Sit to Supine     Supine to sit: Min assist;Min guard Sit to supine: Min guard      Transfers Overall transfer level: Needs assistance Equipment used: None Transfers: Sit to/from Stand Sit to Stand: Min guard            Ambulation/Gait Ambulation/Gait assistance: Min guard   Assistive device: 1 person hand held assist;IV Pole Gait Pattern/deviations: Step-through pattern;Decreased stride length Gait velocity: reduced Gait velocity interpretation: <1.31 ft/sec, indicative of household ambulator General Gait Details: SOB with walk initially on room air per pt request, but dropped to 82% sat, then remained above 90% with supplemental O2   Stairs              Wheelchair Mobility    Modified Rankin (Stroke Patients Only)       Balance Overall balance assessment: Needs assistance Sitting-balance support: Feet supported Sitting balance-Leahy Scale: Good     Standing balance support: Single extremity supported Standing balance-Leahy Scale: Fair Standing balance comment: less than fair dynamically                            Cognition Arousal/Alertness: Awake/alert Behavior During Therapy: WFL for tasks assessed/performed Overall Cognitive Status: No family/caregiver present to determine baseline cognitive functioning                                 General Comments: has issues with understanding the consequences of being hypoxic      Exercises      General Comments General comments (skin integrity, edema, etc.): Pt is up to walk with help on the hallway and noted her sats were stable on O2, but had permission to take off O2 at rest      Pertinent Vitals/Pain Pain Assessment: No/denies pain    Home Living                      Prior Function            PT Goals (current goals can now be found in the care plan section) Acute Rehab  PT Goals Patient Stated Goal: Get home PT Goal Formulation: With patient Progress towards PT goals: Progressing toward goals    Frequency    Min 3X/week      PT Plan Current plan remains appropriate    Co-evaluation              AM-PAC PT "6 Clicks" Mobility   Outcome Measure  Help needed turning from your back to your side while in a flat bed without using bedrails?: None Help needed moving from lying on your back to sitting on the side of a flat bed without using bedrails?: None Help needed moving to and from a bed to a chair (including a wheelchair)?: A Little Help needed standing up from a chair using your arms (e.g., wheelchair or bedside chair)?: A Little Help needed to walk in hospital room?: A Little Help needed climbing  3-5 steps with a railing? : A Lot 6 Click Score: 19    End of Session Equipment Utilized During Treatment: Gait belt;Oxygen Activity Tolerance: Patient limited by fatigue;Treatment limited secondary to medical complications (Comment) Patient left: in bed;with call bell/phone within reach;with bed alarm set Nurse Communication: Mobility status PT Visit Diagnosis: Unsteadiness on feet (R26.81);Muscle weakness (generalized) (M62.81);Difficulty in walking, not elsewhere classified (R26.2)     Time: 3710-6269 PT Time Calculation (min) (ACUTE ONLY): 25 min  Charges:  $Gait Training: 8-22 mins $Therapeutic Activity: 8-22 mins                 Ramond Dial 07/26/2020, 6:00 PM  Mee Hives, PT MS Acute Rehab Dept. Number: [place]

## 2020-07-26 NOTE — Progress Notes (Signed)
PROGRESS NOTE    [name]  [id number] DOB: [birth year] DOA: 07/24/2020 PCP: [name], MD    Chief Complaint  Patient presents with  . Shortness of Breath    Brief Narrative:  [name] is a 55 y.o. female with medical history significant of asthma/COPD, cocaine abuse, anxiety depression, HLD, presented with new onset of shortness of breath for 1 day.  Also have acute metabolic encephalopathy on presentation   Subjective:  She is fully alert, pleasant , report less wheezing, cough now is productive  no edema, no fever Denies pain  Assessment & Plan:   Active Problems:   COPD exacerbation (HCC)   COPD exacerbation/acute hypercapnic and hypoxic respiratory failure/acute metabolic encephalopathy -Initially needed BiPAP -Chest x-ray personally reviewed no acute process -SARS-CoV-2 screening negative -D-dimer 0.39 -She is improving, decrease IV Solu-Medrol, continue nebulizer, Zithromax -Ambulate, wean oxygen as able  Leukocytosis Likely due to steroid Monitor  Sinus tachycardia -Possibly from COPD exacerbation plus minus nebulizer treatment - check TSH  Microcytic anemia Hemoglobin 9.9/MCV 73.5 -Denies external blood loss -very low iron store, history of gastric bypass surgery -Start iron supplement, will discuss with her about IV iron supplement  Noninsulin-dependent type 2 diabetes -Report primary history of insulin-dependent type 2 diabetes, she was taken off insulin after weight loss surgery -A1c 7.6 -Expect hyperglycemia with steroid use, wean steroid , continue  SSI  Hyperlipidemia Continue statin  Chronic pain On Neurontin, Flexeril, Voltaren gel  Psych: Home medication Abilify/Lamictal/trazodone/Neurontin/Prozac Denies SI/ HI   Polysubstance abuse UDS positive for cocaine Per previous psychiatry notes patient is on amantadine for cocaine craving Also have history of alcohol use  Currently everyday smoker Nicotine patch  provided  Body mass index is 18.24 kg/m. History of gastric bypass surgery in 2017   DVT prophylaxis: enoxaparin (LOVENOX) injection 40 mg Start: 07/24/20 1400   Code Status: Full Family Communication: Patient Disposition:   Status is: Inpatient   Dispo: The patient is from: Home              Anticipated d/c is to: Home              Anticipated d/c date is: 24 to 48 hours                Consultants:   None  Procedures:   BiPAP  Antimicrobials:   Zithromax     Objective: Vitals:   07/26/20 0022 07/26/20 0334 07/26/20 0447 07/26/20 0452  BP: 107/76 (!) 141/83 96/66 106/72  Pulse: 87 100 (!) 103 97  Resp: 17 20 20 19   Temp: 97.7 F (36.5 C) 98.2 F (36.8 C)  98.4 F (36.9 C)  TempSrc: Oral Oral  Oral  SpO2: 95% 96% 97% 98%  Weight: 45.2 kg     Height:        Intake/Output Summary (Last 24 hours) at 07/26/2020 0724 Last data filed at 07/26/2020 0500 Gross per 24 hour  Intake 1079.85 ml  Output 2500 ml  Net -1420.15 ml   Filed Weights   07/24/20 0913 07/25/20 2031 07/26/20 0022  Weight: 45.4 kg 45 kg 45.2 kg    Examination:  General exam: calm, NAD Respiratory system: less bilateral wheezing,less tachypnea Cardiovascular system: Continue to have sinus tachycardia , no murmur, No pedal edema. Gastrointestinal system: Abdomen is nondistended, soft and nontender. Normal bowel sounds heard. Central nervous system: Alert and oriented. No focal neurological deficits. Extremities: Symmetric 5 x 5 power. Skin: No rashes, lesions or ulcers Psychiatry:  Judgement and insight appear normal. Mood & affect appropriate.     Data Reviewed: I have personally reviewed following labs and imaging studies  CBC: Recent Labs  Lab 07/24/20 0915 07/24/20 1038 07/24/20 1255 07/24/20 2045 07/25/20 0405 07/26/20 0318  WBC 9.4  --   --   --  18.6* 14.1*  NEUTROABS  --   --   --   --   --  13.1*  HGB 10.5* 11.9* 11.6* 11.6* 9.9* 10.4*  HCT 35.0* 35.0* 34.0* 34.0*  34.6* 35.2*  MCV 71.9*  --   --   --  73.5* 72.4*  PLT 337  --   --   --  304 914    Basic Metabolic Panel: Recent Labs  Lab 07/24/20 0915 07/24/20 1038 07/24/20 1255 07/24/20 2045 07/25/20 0405 07/26/20 0318  NA 133* 137 138 138 137 136  K 4.8 4.0 3.9 4.0 4.5 4.6  CL 97*  --   --   --  101 99  CO2 25  --   --   --  26 28  GLUCOSE 199*  --   --   --  113* 282*  BUN 6  --   --   --  9 11  CREATININE 0.64  --   --   --  0.57 0.77  CALCIUM 8.6*  --   --   --  8.7* 8.5*  MG  --   --   --   --   --  2.2    GFR: Estimated Creatinine Clearance: 56 mL/min (by C-G formula based on SCr of 0.77 mg/dL).  Liver Function Tests: Recent Labs  Lab 07/26/20 0318  AST 25  ALT 16  ALKPHOS 60  BILITOT 0.4  PROT 6.6  ALBUMIN 3.0*    CBG: Recent Labs  Lab 07/25/20 0832 07/25/20 1315 07/25/20 1728 07/25/20 2128 07/26/20 0626  GLUCAP 293* 209* 135* 179* 197*     Recent Results (from the past 240 hour(s))  SARS Coronavirus 2 by RT PCR (hospital order, performed in [facility] hospital lab) Nasopharyngeal Nasopharyngeal Swab     Status: None   Collection Time: 07/24/20 12:46 PM   Specimen: Nasopharyngeal Swab  Result Value Ref Range Status   SARS Coronavirus 2 NEGATIVE NEGATIVE Final    Comment: (NOTE) SARS-CoV-2 target nucleic acids are NOT DETECTED.  The SARS-CoV-2 RNA is generally detectable in upper and lower respiratory specimens during the acute phase of infection. The lowest concentration of SARS-CoV-2 viral copies this assay can detect is 250 copies / mL. A negative result does not preclude SARS-CoV-2 infection and should not be used as the sole basis for treatment or other patient management decisions.  A negative result may occur with improper specimen collection / handling, submission of specimen other than nasopharyngeal swab, presence of viral mutation(s) within the areas targeted by this assay, and inadequate number of viral copies (<250 copies / mL). A  negative result must be combined with clinical observations, patient history, and epidemiological information.  Fact Sheet for Patients:   StrictlyIdeas.no  Fact Sheet for Healthcare Providers: BankingDealers.co.za  This test is not yet approved or  cleared by the [place] FDA and has been authorized for detection and/or diagnosis of SARS-CoV-2 by FDA under an Emergency Use Authorization (EUA).  This EUA will remain in effect (meaning this test can be used) for the duration of the COVID-19 declaration under Section 564(b)(1) of the Act, 21 U.S.C. section 360bbb-3(b)(1), unless the authorization is terminated  or revoked sooner.  Performed at [facility], [place] [address], [place], [place] [address]          Radiology Studies: DG Chest Portable 1 View  Result Date: 07/24/2020 CLINICAL DATA:  Shortness of breath. EXAM: PORTABLE CHEST 1 VIEW COMPARISON:  April 20, 2020. FINDINGS: The heart size and mediastinal contours are within normal limits. Both lungs are clear. No pneumothorax or pleural effusion is noted. The visualized skeletal structures are unremarkable. IMPRESSION: No active disease. Electronically Signed   By: Marijo Conception M.D.   On: 07/24/2020 09:36        Scheduled Meds: . azithromycin  500 mg Oral Daily  . enoxaparin (LOVENOX) injection  40 mg Subcutaneous Q24H  . FLUoxetine  20 mg Oral Daily  . fluticasone furoate-vilanterol  1 puff Inhalation Daily  . gabapentin  100 mg Oral TID  . guaiFENesin  600 mg Oral BID  . insulin aspart  0-15 Units Subcutaneous TID WC  . lamoTRIgine  25 mg Oral BID  . levalbuterol  1.25 mg Nebulization Q6H  . methylPREDNISolone (SOLU-MEDROL) injection  40 mg Intravenous Q6H  . nicotine  21 mg Transdermal Daily  . umeclidinium bromide  1 puff Inhalation Daily   Continuous Infusions: . sodium chloride 75 mL/hr at 07/25/20 2100     LOS: 2 days   Time spent:  41mins Greater than 50% of this time was spent in counseling, explanation of diagnosis, planning of further management, and coordination of care.  I have personally reviewed and interpreted on  07/26/2020 daily labs, tele strips, imagings as discussed above under date review session and assessment and plans.  I reviewed all nursing notes, pharmacy notes,  vitals, pertinent old records  I have discussed plan of care as described above with RN , patient  on 07/26/2020  Voice Recognition /Dragon dictation system was used to create this note, attempts have been made to correct errors. Please contact the author with questions and/or clarifications.   Florencia Reasons, MD PhD FACP [facility]  Available via Epic secure chat 7am-7pm for nonurgent issues Please page for urgent issues To page the attending provider between 7A-7P or the covering provider during after hours 7P-7A, please log into the web site [email or web address] and access using universal [place] password for that web site. If you do not have the password, please call the hospital operator.    07/26/2020, 7:24 AM

## 2020-07-26 NOTE — Plan of Care (Signed)
  Problem: Education: Goal: Knowledge of General Education information will improve Description Including pain rating scale, medication(s)/side effects and non-pharmacologic comfort measures Outcome: Progressing   Problem: Health Behavior/Discharge Planning: Goal: Ability to manage health-related needs will improve Outcome: Progressing   

## 2020-07-27 ENCOUNTER — Other Ambulatory Visit: Payer: Self-pay | Admitting: Internal Medicine

## 2020-07-27 DIAGNOSIS — J441 Chronic obstructive pulmonary disease with (acute) exacerbation: Secondary | ICD-10-CM

## 2020-07-27 LAB — GLUCOSE, CAPILLARY: Glucose-Capillary: 166 mg/dL — ABNORMAL HIGH (ref 70–99)

## 2020-07-27 LAB — TSH: TSH: 1.418 u[IU]/mL (ref 0.350–4.500)

## 2020-07-27 LAB — BASIC METABOLIC PANEL
Anion gap: 12 (ref 5–15)
BUN: 7 mg/dL (ref 6–20)
CO2: 26 mmol/L (ref 22–32)
Calcium: 8.6 mg/dL — ABNORMAL LOW (ref 8.9–10.3)
Chloride: 99 mmol/L (ref 98–111)
Creatinine, Ser: 0.49 mg/dL (ref 0.44–1.00)
GFR, Estimated: >60 mL/min (ref >60)
Glucose, Bld: 199 mg/dL — ABNORMAL HIGH (ref 70–99)
Potassium: 4.9 mmol/L (ref 3.5–5.1)
Sodium: 137 mmol/L (ref 135–145)

## 2020-07-27 LAB — MAGNESIUM: Magnesium: 2.1 mg/dL (ref 1.7–2.4)

## 2020-07-27 LAB — CALCIUM, IONIZED: Calcium, Ionized, Serum: 4.7 mg/dL (ref 4.5–5.6)

## 2020-07-27 MED ORDER — CHOLECALCIFEROL 125 MCG (5000 UT) PO CAPS: 5000.0000 [IU] | ORAL_CAPSULE | Freq: Every day | ORAL | 0 refills | Status: AC

## 2020-07-27 MED ORDER — CHOLECALCIFEROL 25 MCG (1000 UT) PO CAPS: 1000.0000 [IU] | ORAL_CAPSULE | Freq: Every day | ORAL | 0 refills | Status: DC

## 2020-07-27 MED ORDER — CHOLECALCIFEROL 1.25 MG (50000 UT) PO CAPS: 50000.0000 [IU] | ORAL_CAPSULE | Freq: Every day | ORAL | 0 refills | Status: DC

## 2020-07-27 MED ORDER — DOXYCYCLINE MONOHYDRATE 100 MG PO TABS: 100.0000 mg | ORAL_TABLET | Freq: Two times a day (BID) | ORAL | 0 refills | Status: AC

## 2020-07-27 MED ORDER — INSULIN PEN NEEDLE 29G X 12MM MISC: 0 refills | Status: DC

## 2020-07-27 MED ORDER — BLOOD GLUCOSE MONITOR KIT: PACK | 0 refills | Status: DC

## 2020-07-27 MED ORDER — FERROUS SULFATE 325 (65 FE) MG PO TABS: 325.0000 mg | ORAL_TABLET | Freq: Every day | ORAL | 0 refills | Status: DC

## 2020-07-27 MED ORDER — METFORMIN HCL 500 MG PO TABS: 500.0000 mg | ORAL_TABLET | Freq: Two times a day (BID) | ORAL | 0 refills | Status: DC

## 2020-07-27 MED ORDER — PREDNISONE 10 MG PO TABS: ORAL_TABLET | ORAL | 0 refills | Status: DC

## 2020-07-27 MED ORDER — GUAIFENESIN ER 600 MG PO TB12: 600.0000 mg | ORAL_TABLET | Freq: Two times a day (BID) | ORAL | Status: DC

## 2020-07-27 MED ORDER — INSULIN ASPART 100 UNIT/ML FLEXPEN: PEN_INJECTOR | SUBCUTANEOUS | 0 refills | Status: DC

## 2020-07-27 MED ORDER — ADULT MULTIVITAMIN W/MINERALS CH: 1.0000 | ORAL_TABLET | Freq: Two times a day (BID) | ORAL | Status: DC

## 2020-07-27 MED ORDER — ALPRAZOLAM 1 MG PO TABS: 1.0000 mg | ORAL_TABLET | Freq: Two times a day (BID) | ORAL | 0 refills | Status: DC | PRN

## 2020-07-27 NOTE — TOC Transition Note (Signed)
Transition of Care [facility]) - CM/SW Discharge Note   Patient Details  Name: ARMYA WESTERHOFF MRN: [id number] Date of Birth: 02-Dec-1965  [facility]) CM/SW Contact:  Zenon Mayo, RN Phone Number: 07/27/2020, 11:11 AM   Clinical Narrative:    Patient is for dc today, she has home oxygen with [employer]  At home. Daughter is here to transport her home, patient states she needs another glucometer, MD will give her a scipt for gluocmeter and strips.  She has no other needs.   Final next level of care: Home/Self Care Barriers to Discharge: No Barriers Identified   Patient Goals and CMS Choice Patient states their goals for this hospitalization and ongoing recovery are:: get better   Choice offered to / list presented to : NA  Discharge Placement                       Discharge Plan and Services   Discharge Planning Services: CM Consult              DME Agency: NA       HH Arranged: NA          Social Determinants of Health (SDOH) Interventions     Readmission Risk Interventions Readmission Risk Prevention Plan 07/27/2020  Transportation Screening Complete  PCP or Specialist Appt within 5-7 Days Complete  Home Care Screening Complete  Some recent data might be hidden

## 2020-07-27 NOTE — Discharge Summary (Addendum)
Discharge Summary  [name] QMG:[id number] DOB: September 22, 1965  PCP: [name], MD  Admit date: 07/24/2020 Discharge date: 07/27/2020  Time spent:  65mns  Recommendations for Outpatient Follow-up:  1. F/u with PCP within a week  for hospital discharge follow up, repeat cbc/bmp at follow up, pcp to follow up on pending labs (micronutr, pcp to monitor diabetes management 2. Recommend follow up with pulmonology for copd management 3. Outpatient pulmonary rehab recommended    Discharge Diagnoses:  Active Hospital Problems   Diagnosis Date Noted  . COPD exacerbation (HHays 09/29/2011    Resolved Hospital Problems  No resolved problems to display.    Discharge Condition: stable  Diet recommendation: heart healthy/carb modified  Filed Weights   07/25/20 2031 07/26/20 0022 07/27/20 0011  Weight: 45 kg 45.2 kg 46.7 kg    History of present illness: (per admitting MD Dr ZRoosevelt Locks ESampson Goonis a 55y.o. female with medical history significant of asthma/COPD, cocaine abuse, anxiety depression, HLD, presented with new onset of shortness of breath for 1 day.  Patient lethargic, most history provided by patient husband over the phone.  Husband reported patient has been having more cough for the last few days but this morning patient woke up with complaint of severe shortness of breath and wheezing.  And family called EMS.  Husband also confirmed that patient has been continue to smoke and use cocaine/snorting.  Appears to have no fever and no chest pains. ED Course: Patient was agitated and 1 dose of Ativan given.  VBG on admission 7.4/45/36 chest x-ray clear and COVID-19 test negative.  Treatment became somnolent at depending of hypoxia, repeat ABG showed 7.29/67/110  Hospital Course:  Active Problems:   COPD exacerbation (HCC)  COPD exacerbation/acute hypercapnic and hypoxic respiratory failure/acute metabolic encephalopathy -Initially needed BiPAP, treated with iv solumedrol,  nebulizer, zithromax -Chest x-ray personally reviewed no acute process -SARS-CoV-2 screening negative -D-dimer 0.39 -She greatly improved, she desires to go home, she is discharged on prednisone taper , f/u with pcp and pulmonology  Patient Saturations on Room Air at Rest = 92% Patient Saturations on Room Air while Ambulating = 88% Patient Saturations on 1 Liters of oxygen while Ambulating = 90% Please briefly explain why patient needs home oxygen: Pt not able to maintain Sp02 >90% without supplemental 02.  Continue home 02 with activity   Leukocytosis/sinus tachycardia Likely from stress due to COPD exacerbation Much improved  Noninsulin-dependent type 2 diabetes -Report primary history of insulin-dependent type 2 diabetes, she was taken off insulin after weight loss surgery -A1c 7.6 -Expect hyperglycemia with steroid use, wean steroid , she is sent home with  novolog flex pen for SSI, metformin, Glucometer/testing supplies prescribed -follow up with pcp  Hyperlipidemia Continue statin   vitaminD deficiency: Vitamin d supplement  Iron deficiency anemia:  Denies blood loss, reports stool is brown Hemoglobin 9.9/MCV 73.5 start Iron supplement , if no improvement after oral iron supplement, may consider iv iron  F/u with pcp  Underweight , h/o gastric bypass surgery in 2017 Body mass index is 18.84 kg/m. At risk of Micronutrient deficiencies, micronutrient labs pending at time of discharge, pcp to follow up, supplement if needed  Chronic pain On Neurontin, Flexeril, Voltaren gel  Psych: Home medication Abilify/Lamictal/trazodone/Neurontin/Prozac Denies SI/ HI Appear very pleasant    Polysubstance abuse UDS positive for cocaine Per previous psychiatry notes patient is on amantadine for cocaine craving Also reports daily alcohol use  Currently everyday smoker Smoking cessation education provided, she  agrees to Nicotine patch   DVT prophylaxis: enoxaparin  (LOVENOX) injection 40 mg Start: 07/24/20 1400   Code Status: Full Family Communication: daughter (who is a CSW at [employer]) at bedside   Procedures:  bipap  Consultations:  none  Discharge Exam: BP 131/73   Pulse 97   Temp 98.5 F (36.9 C) (Oral)   Resp 19   Ht 5' 2" (1.575 m)   Wt 46.7 kg   LMP 01/08/2011   SpO2 93%   BMI 18.84 kg/m   General: NAD Cardiovascular: RRR Respiratory: bilateral wheezing has resolved, much improved aeration  Discharge Instructions You were cared for by a hospitalist during your hospital stay. If you have any questions about your discharge medications or the care you received while you were in the hospital after you are discharged, you can call the unit and asked to speak with the hospitalist on call if the hospitalist that took care of you is not available. Once you are discharged, your primary care physician will handle any further medical issues. Please note that NO REFILLS for any discharge medications will be authorized once you are discharged, as it is imperative that you return to your primary care physician (or establish a relationship with a primary care physician if you do not have one) for your aftercare needs so that they can reassess your need for medications and monitor your lab values.  Discharge Instructions    Diet Carb Modified   Complete by: As directed    Increase activity slowly   Complete by: As directed      Allergies as of 07/27/2020      Reactions   Morphine And Related Nausea And Vomiting, Other (See Comments)   Pt stated she is not allergic to medication   Aspirin Nausea And Vomiting, Other (See Comments)   Ok to take tylenol or ibuprofen      Medication List    STOP taking these medications   amantadine 100 MG capsule Commonly known as: SYMMETREL   cyclobenzaprine 5 MG tablet Commonly known as: FLEXERIL   diclofenac sodium 1 % Gel Commonly known as: VOLTAREN   FLUoxetine 20 MG capsule Commonly  known as: PROZAC   lamoTRIgine 25 MG tablet Commonly known as: LAMICTAL   lidocaine 5 % Commonly known as: LIDODERM   pantoprazole 40 MG tablet Commonly known as: PROTONIX   simvastatin 20 MG tablet Commonly known as: ZOCOR   tiZANidine 4 MG tablet Commonly known as: ZANAFLEX   traZODone 50 MG tablet Commonly known as: DESYREL     TAKE these medications   Abilify Maintena 400 MG Prsy prefilled syringe Generic drug: ARIPiprazole ER Inject 400 mg as directed every 28 (twenty-eight) days.   albuterol 108 (90 Base) MCG/ACT inhaler Commonly known as: ProAir HFA Inhale 2 puffs into the lungs every 6 (six) hours as needed for wheezing or shortness of breath.   ALPRAZolam 1 MG tablet Commonly known as: XANAX Take 1 tablet (1 mg total) by mouth 2 (two) times daily as needed for anxiety. What changed:   when to take this  reasons to take this   blood glucose meter kit and supplies Kit Dispense based on patient and insurance preference. Use up to four times daily as directed. (FOR ICD-9 250.00, 250.01).   Cholecalciferol 125 MCG (5000 UT) capsule Take 1 capsule (5,000 Units total) by mouth daily for 7 days.   Cholecalciferol 25 MCG (1000 UT) capsule Take 1 capsule (1,000 Units total) by mouth daily.  Start taking on: August 03, 2020   doxycycline 100 MG tablet Commonly known as: ADOXA Take 1 tablet (100 mg total) by mouth 2 (two) times daily for 5 days.   ferrous sulfate 325 (65 FE) MG tablet Take 1 tablet (325 mg total) by mouth daily with breakfast. Start taking on: July 28, 2020   gabapentin 300 MG capsule Commonly known as: Neurontin Take 1 capsule (300 mg total) by mouth 3 (three) times daily.   guaiFENesin 600 MG 12 hr tablet Commonly known as: MUCINEX Take 1 tablet (600 mg total) by mouth 2 (two) times daily.   ibuprofen 800 MG tablet Commonly known as: ADVIL Take 800 mg by mouth 3 (three) times daily as needed for pain.   insulin aspart 100  UNIT/ML FlexPen Commonly known as: NOVOLOG Insulin sliding scale: Blood sugar  120-150   3units                       151-200   4units                       201-250   7units                       251- 300  11units                       301-350   15uints                       351-400   20units                       >400         call MD immediately   Insulin Pen Needle 29G X 12MM Misc novolog sliding scale insulin three times a day as needed   ipratropium-albuterol 0.5-2.5 (3) MG/3ML Soln Commonly known as: DUONEB Take 3 mLs by nebulization every 6 (six) hours as needed for wheezing or shortness of breath.   metFORMIN 500 MG tablet Commonly known as: Glucophage Take 1 tablet (500 mg total) by mouth 2 (two) times daily with a meal.   multivitamin with minerals Tabs tablet Take 1 tablet by mouth 2 (two) times daily.   nicotine 21 mg/24hr patch Commonly known as: NICODERM CQ - dosed in mg/24 hours Place 1 patch (21 mg total) onto the skin daily. (May purchase from over the counter): For nicotine withdrawal symptoms   predniSONE 10 MG tablet Commonly known as: DELTASONE Label  & dispense according to the schedule below. 5Pills PO on day one then, 4 Pills PO on day two, 3 Pills PO on day three, 2Pills PO on day four, 1Pills PO on day five,  then STOP.  Total of 15 tabs   Symbicort 160-4.5 MCG/ACT inhaler Generic drug: budesonide-formoterol Inhale 2 puffs into the lungs daily as needed (sob and wheezing).   tiotropium 18 MCG inhalation capsule Commonly known as: Spiriva HandiHaler Place 1 capsule (18 mcg total) into inhaler and inhale daily. For COPD      Allergies  Allergen Reactions  . Morphine And Related Nausea And Vomiting and Other (See Comments)    Pt stated she is not allergic to medication  . Aspirin Nausea And Vomiting and Other (See Comments)    Ok to take tylenol or ibuprofen    Follow-up Information    [name], MD Follow  up in 1 week(s).   Specialty:  Internal Medicine Why: hospital discharge follow up, follow up with anemia and low iron level, vitamin d level follow up on pending lab result Contact information: [place] Name [place] [phone]        [facility]. Schedule an appointment as soon as possible for a visit in 3 week(s).   Specialty: Pulmonology Why: for copd management  Contact information: [address] [place] [address] [phone]               The results of significant diagnostics from this hospitalization (including imaging, microbiology, ancillary and laboratory) are listed below for reference.    Significant Diagnostic Studies: DG Chest Portable 1 View  Result Date: 07/24/2020 CLINICAL DATA:  Shortness of breath. EXAM: PORTABLE CHEST 1 VIEW COMPARISON:  April 20, 2020. FINDINGS: The heart size and mediastinal contours are within normal limits. Both lungs are clear. No pneumothorax or pleural effusion is noted. The visualized skeletal structures are unremarkable. IMPRESSION: No active disease. Electronically Signed   By: Marijo Conception M.D.   On: 07/24/2020 09:36    Microbiology: Recent Results (from the past 240 hour(s))  SARS Coronavirus 2 by RT PCR (hospital order, performed in [facility] hospital lab) Nasopharyngeal Nasopharyngeal Swab     Status: None   Collection Time: 07/24/20 12:46 PM   Specimen: Nasopharyngeal Swab  Result Value Ref Range Status   SARS Coronavirus 2 NEGATIVE NEGATIVE Final    Comment: (NOTE) SARS-CoV-2 target nucleic acids are NOT DETECTED.  The SARS-CoV-2 RNA is generally detectable in upper and lower respiratory specimens during the acute phase of infection. The lowest concentration of SARS-CoV-2 viral copies this assay can detect is 250 copies / mL. A negative result does not preclude SARS-CoV-2 infection and should not be used as the sole basis for treatment or other patient management decisions.  A  negative result may occur with improper specimen collection / handling, submission of specimen other than nasopharyngeal swab, presence of viral mutation(s) within the areas targeted by this assay, and inadequate number of viral copies (<250 copies / mL). A negative result must be combined with clinical observations, patient history, and epidemiological information.  Fact Sheet for Patients:   StrictlyIdeas.no  Fact Sheet for Healthcare Providers: BankingDealers.co.za  This test is not yet approved or  cleared by the [place] FDA and has been authorized for detection and/or diagnosis of SARS-CoV-2 by FDA under an Emergency Use Authorization (EUA).  This EUA will remain in effect (meaning this test can be used) for the duration of the COVID-19 declaration under Section 564(b)(1) of the Act, 21 U.S.C. section 360bbb-3(b)(1), unless the authorization is terminated or revoked sooner.  Performed at [facility], [place] [address], [place], [place] [address]      Labs: Basic Metabolic Panel: Recent Labs  Lab 07/24/20 0915 07/24/20 1038 07/24/20 1255 07/24/20 2045 07/25/20 0405 07/26/20 0318 07/27/20 0534  NA 133*   < > 138 138 137 136 137  K 4.8   < > 3.9 4.0 4.5 4.6 4.9  CL 97*  --   --   --  101 99 99  CO2 25  --   --   --  _0 GLUCOSE 199*  --   --   --  113* 282* 199*  BUN 6  --   --   --  _1 CREATININE 0.64  --   --   --  0.57 0.77 0.49  CALCIUM 8.6*  --   --   --  8.7* 8.5* 8.6*  MG  --   --   --   --   --  2.2 2.1   < > = values in this interval not displayed.   Liver Function Tests: Recent Labs  Lab 07/26/20 0318  AST 25  ALT 16  ALKPHOS 60  BILITOT 0.4  PROT 6.6  ALBUMIN 3.0*   No results for input(s): LIPASE, AMYLASE in the last 168 hours. No results for input(s): AMMONIA in the last 168 hours. CBC: Recent Labs  Lab 07/24/20 0915 07/24/20 1038 07/24/20 1255 07/24/20 2045  07/25/20 0405 07/26/20 0318 07/26/20 1741  WBC 9.4  --   --   --  18.6* 14.1* 12.7*  NEUTROABS  --   --   --   --   --  13.1* 10.9*  HGB 10.5*   < > 11.6* 11.6* 9.9* 10.4* 8.8*  HCT 35.0*   < > 34.0* 34.0* 34.6* 35.2* 30.6*  MCV 71.9*  --   --   --  73.5* 72.4* 72.3*  PLT 337  --   --   --  304 303 284   < > = values in this interval not displayed.   Cardiac Enzymes: No results for input(s): CKTOTAL, CKMB, CKMBINDEX, TROPONINI in the last 168 hours. BNP: BNP (last 3 results) No results for input(s): BNP in the last 8760 hours.  ProBNP (last 3 results) No results for input(s): PROBNP in the last 8760 hours.  CBG: Recent Labs  Lab 07/26/20 0753 07/26/20 1206 07/26/20 1649 07/26/20 2141 07/27/20 0603  GLUCAP 114* 283* 214* 157* 166*       Signed:  Florencia Reasons MD, PhD, FACP  [facility] 07/27/2020, 9:38 PM

## 2020-07-27 NOTE — TOC Initial Note (Signed)
Transition of Care [facility]) - Initial/Assessment Note    Patient Details  Name: [name] MRN: [id number] Date of Birth: 1965/10/17  Transition of [facility]) CM/SW Contact:    Zenon Mayo, RN Phone Number: 07/27/2020, 11:09 AM  Clinical Narrative:                 Patient is for dc today, she has home oxygen with [employer]  At home. Daughter is here to transport her home, patient states she needs another glucometer, MD will give her a scipt for gluocmeter and strips.  She has no other needs.   Expected Discharge Plan: Home/Self Care Barriers to Discharge: No Barriers Identified   Patient Goals and CMS Choice Patient states their goals for this hospitalization and ongoing recovery are:: get better   Choice offered to / list presented to : NA  Expected Discharge Plan and Services Expected Discharge Plan: Home/Self Care   Discharge Planning Services: CM Consult   Living arrangements for the past 2 months: Single Family Home Expected Discharge Date: 07/27/20                 DME Agency: NA       HH Arranged: NA          Prior Living Arrangements/Services Living arrangements for the past 2 months: Single Family Home Lives with:: Self Patient language and need for interpreter reviewed:: Yes Do you feel safe going back to the place where you live?: Yes      Need for Family Participation in Patient Care: Yes (Comment) Care giver support system in place?: Yes (comment) Current home services: DME (has home oxygen with [employer]) Criminal Activity/Legal Involvement Pertinent to Current Situation/Hospitalization: No - Comment as needed  Activities of Daily Living Home Assistive Devices/Equipment: Cane (specify quad or straight),Oxygen ADL Screening (condition at time of admission) Patient's cognitive ability adequate to safely complete daily activities?: Yes Is the patient deaf or have difficulty hearing?: No Does the patient have difficulty seeing, even when wearing  glasses/contacts?: No Does the patient have difficulty concentrating, remembering, or making decisions?: No Patient able to express need for assistance with ADLs?: Yes Does the patient have difficulty dressing or bathing?: No Independently performs ADLs?: Yes (appropriate for developmental age) Does the patient have difficulty walking or climbing stairs?: Yes Weakness of Legs: Both Weakness of Arms/Hands: None  Permission Sought/Granted                  Emotional Assessment Appearance:: Appears stated age Attitude/Demeanor/Rapport: Engaged Affect (typically observed): Appropriate Orientation: : Oriented to Self,Oriented to Place,Oriented to  Time,Oriented to Situation Alcohol / Substance Use: Not Applicable Psych Involvement: No (comment)  Admission diagnosis:  COPD (chronic obstructive pulmonary disease) ([place]) [J44.9] COPD exacerbation ([place]) [J44.1] Acute respiratory failure with hypoxia ([place]) [J96.01] Patient Active Problem List   Diagnosis Date Noted  . Polysubstance (excluding opioids) dependence, daily use ([place]) 08/04/2018  . Substance induced mood disorder ([place]) 08/03/2018  . Schizoaffective disorder, bipolar type ([place]) 11/13/2017  . Polysubstance abuse ([place]) 11/13/2017  . 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 Abdominal pain 04/18/2016  . Dehydration 04/17/2016  . Morbid obesity ([place]) 03/25/2016  . Type 2 diabetes mellitus with diabetic polyneuropathy, with long-term current use of insulin ([place]) 09/06/2015  . Vaginitis and vulvovaginitis, unspecified 04/19/2013  . BV (bacterial vaginosis) 04/19/2013  . HTN (hypertension) 11/04/2011  . Hypokalemia 10/02/2011  . atrial fibrillation 09/30/2011  . Acute respiratory failure ([place])  09/29/2011  . COPD exacerbation ([place]) 09/29/2011  . Accidental drug overdose 09/29/2011   PCP:  [name], MD Pharmacy:   [facility] [address] [place] [address] Phone: [phone] Fax: [phone]     Social Determinants of Health (SDOH) Interventions    Readmission Risk Interventions Readmission Risk Prevention Plan 07/27/2020  Transportation Screening Complete  PCP or Specialist Appt within 5-7 Days Complete  Home Care Screening Complete  Some recent data might be hidden

## 2020-07-27 NOTE — Progress Notes (Signed)
SATURATION QUALIFICATIONS: (This note is used to comply with regulatory documentation for home oxygen)  Patient Saturations on Room Air at Rest = 92%  Patient Saturations on Room Air while Ambulating = 88%  Patient Saturations on 1 Liters of oxygen while Ambulating = 90%  Please briefly explain why patient needs home oxygen: Pt not able to maintain Sp02 >90% without supplemental 02.     Zettie Cooley, DPT Acute Rehabilitation Services Pager [phone] Office [phone]

## 2020-07-27 NOTE — Progress Notes (Signed)
Physical Therapy Treatment Patient Details Name: [name] MRN: [id number] DOB: 08-02-1965 Today's Date: 07/27/2020    History of Present Illness [name] is a 55 y.o. female with medical history significant of asthma/COPD, cocaine abuse, anxiety depression, HLD, presented with new onset of shortness of breath for 1 day and recent cocaine use.    PT Comments    Patient progressing well towards PT goals. Improved ambulation distance with supervision for safety and SP02 remained >88% on RA with activity. Remained >90% on 1L/min 02 [place] with walking. Pt with 2/4 DOE but reports feeling fine. Encouraged increasing activity and walking while in the hospital to improve oxygenation, endurance and tolerance to activity. Education re: energy conservation technique and pacing self. Pt with poor understanding of need for 02 and SOB.  Hopeful to d/c today. Will follow.    Follow Up Recommendations  Supervision for mobility/OOB;Outpatient PT (pulmonary OP rehab)     Equipment Recommendations  None recommended by PT    Recommendations for Other Services       Precautions / Restrictions Precautions Precautions: Other (comment) Precaution Comments: watch 02 Restrictions Weight Bearing Restrictions: No    Mobility  Bed Mobility Overal bed mobility: Needs Assistance Bed Mobility: Supine to Sit;Sit to Supine     Supine to sit: Modified independent (Device/Increase time);HOB elevated Sit to supine: Modified independent (Device/Increase time);HOB elevated   General bed mobility comments: no physical assist  Transfers Overall transfer level: Needs assistance Equipment used: None Transfers: Sit to/from Stand Sit to Stand: Supervision         General transfer comment: Supervision for safety. Stood from [employer], from toilet x1, no LOB.  Ambulation/Gait Ambulation/Gait assistance: Supervision Gait Distance (Feet): 350 Feet Assistive device: None Gait Pattern/deviations:  Step-through pattern;Decreased stride length   Gait velocity interpretation: 1.31 - 2.62 ft/sec, indicative of limited community ambulator General Gait Details: Slow, mostly steady gait with no standing rest breaks. Sp02 stayed >88% initially on RA and then donned 1L and able to maintain >90%. 2/4 DOE but reports feeling fine. Cues for self pacing/monitoring symptoms.   Stairs             Wheelchair Mobility    Modified Rankin (Stroke Patients Only)       Balance Overall balance assessment: Needs assistance Sitting-balance support: No upper extremity supported;Feet supported Sitting balance-Leahy Scale: Good     Standing balance support: During functional activity Standing balance-Leahy Scale: Fair                              Cognition Arousal/Alertness: Awake/alert Behavior During Therapy: WFL for tasks assessed/performed Overall Cognitive Status: No family/caregiver present to determine baseline cognitive functioning                                 General Comments: Poor understanding of medical condition and need for 02.      Exercises      General Comments General comments (skin integrity, edema, etc.): Sp02 remained >88% on RA with activity, needed 1L02 [place] to maintain 02 >90%. RN notified.      Pertinent Vitals/Pain Pain Assessment: No/denies pain    Home Living                      Prior Function            PT  Goals (current goals can now be found in the care plan section) Progress towards PT goals: Progressing toward goals    Frequency    Min 3X/week      PT Plan Discharge plan needs to be updated    Co-evaluation              AM-PAC PT "6 Clicks" Mobility   Outcome Measure  Help needed turning from your back to your side while in a flat bed without using bedrails?: None Help needed moving from lying on your back to sitting on the side of a flat bed without using bedrails?: None Help needed  moving to and from a bed to a chair (including a wheelchair)?: None Help needed standing up from a chair using your arms (e.g., wheelchair or bedside chair)?: None Help needed to walk in hospital room?: A Little Help needed climbing 3-5 steps with a railing? : A Little 6 Click Score: 22    End of Session Equipment Utilized During Treatment: Gait belt;Oxygen Activity Tolerance: Patient tolerated treatment well Patient left: in bed;with call bell/phone within reach Nurse Communication: Mobility status PT Visit Diagnosis: Unsteadiness on feet (R26.81);Muscle weakness (generalized) (M62.81);Difficulty in walking, not elsewhere classified (R26.2)     Time: 0810-0827 PT Time Calculation (min) (ACUTE ONLY): 17 min  Charges:  $Gait Training: 8-22 mins                     Marisa Severin, PT, DPT Acute Rehabilitation Services Pager [phone] Office [place] 07/27/2020, 9:23 AM

## 2020-07-27 NOTE — Progress Notes (Signed)
Inpatient Diabetes Program Recommendations  AACE/ADA: New Consensus Statement on Inpatient Glycemic Control  Target Ranges:  Prepandial:   less than 140 mg/dL      Peak postprandial:   less than 180 mg/dL (1-2 hours)      Critically ill patients:  140 - 180 mg/dL   Results for ZAMYA, CULHANE (MRN [id number]) as of 07/27/2020 10:01  Ref. Range 07/26/2020 07:53 07/26/2020 12:06 07/26/2020 16:49 07/26/2020 21:41 07/27/2020 06:03  Glucose-Capillary Latest Ref Range: 70 - 99 mg/dL 114 (H) 283 (H) 214 (H) 157 (H) 166 (H)   Review of Glycemic Control  Diabetes history: DM2 Outpatient Diabetes medications: None Current orders for Inpatient glycemic control: Novolog 0-15 units TID with meals; Solumedrol 40 mg Q12H  Inpatient Diabetes Program Recommendations:    Insulin: If steroids are continued, please consider ordering Novolog 3 units TID with meals for meal coverage if patient eats at least 50% of meals.  Thanks, Barnie Alderman, RN, MSN, CDE Diabetes Coordinator Inpatient Diabetes Program [phone] (Team Pager from 8am to 5pm)

## 2020-07-28 LAB — ZINC: Zinc: 49 ug/dL (ref 44–115)

## 2020-07-28 LAB — COPPER, SERUM: Copper: 146 ug/dL (ref 80–158)

## 2020-08-01 ENCOUNTER — Telehealth (HOSPITAL_COMMUNITY): Payer: Self-pay | Admitting: Internal Medicine

## 2020-08-02 LAB — VITAMIN E
Vitamin E (Alpha Tocopherol): 8.5 mg/L (ref 7.0–25.1)
Vitamin E(Gamma Tocopherol): 0.6 mg/L (ref 0.5–5.5)

## 2020-08-02 LAB — VITAMIN A: Vitamin A (Retinoic Acid): 15.3 ug/dL — ABNORMAL LOW (ref 20.1–62.0)

## 2020-08-07 LAB — VITAMIN B1: Vitamin B1 (Thiamine): 117.4 nmol/L (ref 66.5–200.0)

## 2020-08-09 LAB — VITAMIN K1, SERUM: VITAMIN K1: 0.35 ng/mL (ref 0.10–2.20)

## 2020-10-09 ENCOUNTER — Emergency Department (HOSPITAL_COMMUNITY): Payer: Medicare Other

## 2020-10-09 ENCOUNTER — Encounter (HOSPITAL_COMMUNITY): Payer: Self-pay | Admitting: Emergency Medicine

## 2020-10-09 ENCOUNTER — Inpatient Hospital Stay (HOSPITAL_COMMUNITY)
Admission: EM | Admit: 2020-10-09 | Discharge: 2020-10-11 | DRG: 897 | Disposition: A | Discharge status: Home Health | Payer: Medicare Other | Attending: Internal Medicine | Admitting: Internal Medicine

## 2020-10-09 DIAGNOSIS — Z9884 Bariatric surgery status: Secondary | ICD-10-CM

## 2020-10-09 DIAGNOSIS — Z8249 Family history of ischemic heart disease and other diseases of the circulatory system: Secondary | ICD-10-CM

## 2020-10-09 DIAGNOSIS — Z833 Family history of diabetes mellitus: Secondary | ICD-10-CM

## 2020-10-09 DIAGNOSIS — R509 Fever, unspecified: Secondary | ICD-10-CM | POA: Diagnosis not present

## 2020-10-09 DIAGNOSIS — Z781 Physical restraint status: Secondary | ICD-10-CM

## 2020-10-09 DIAGNOSIS — J441 Chronic obstructive pulmonary disease with (acute) exacerbation: Secondary | ICD-10-CM | POA: Diagnosis present

## 2020-10-09 DIAGNOSIS — Z794 Long term (current) use of insulin: Secondary | ICD-10-CM

## 2020-10-09 DIAGNOSIS — F250 Schizoaffective disorder, bipolar type: Secondary | ICD-10-CM | POA: Diagnosis not present

## 2020-10-09 DIAGNOSIS — F1920 Other psychoactive substance dependence, uncomplicated: Secondary | ICD-10-CM | POA: Diagnosis present

## 2020-10-09 DIAGNOSIS — R4182 Altered mental status, unspecified: Secondary | ICD-10-CM

## 2020-10-09 DIAGNOSIS — Z90710 Acquired absence of both cervix and uterus: Secondary | ICD-10-CM

## 2020-10-09 DIAGNOSIS — J449 Chronic obstructive pulmonary disease, unspecified: Secondary | ICD-10-CM | POA: Diagnosis present

## 2020-10-09 DIAGNOSIS — Z635 Disruption of family by separation and divorce: Secondary | ICD-10-CM

## 2020-10-09 DIAGNOSIS — F259 Schizoaffective disorder, unspecified: Secondary | ICD-10-CM | POA: Diagnosis present

## 2020-10-09 DIAGNOSIS — Z79899 Other long term (current) drug therapy: Secondary | ICD-10-CM

## 2020-10-09 DIAGNOSIS — G9340 Encephalopathy, unspecified: Secondary | ICD-10-CM | POA: Diagnosis not present

## 2020-10-09 DIAGNOSIS — I10 Essential (primary) hypertension: Secondary | ICD-10-CM | POA: Diagnosis present

## 2020-10-09 DIAGNOSIS — E119 Type 2 diabetes mellitus without complications: Secondary | ICD-10-CM

## 2020-10-09 DIAGNOSIS — I4891 Unspecified atrial fibrillation: Secondary | ICD-10-CM | POA: Diagnosis present

## 2020-10-09 DIAGNOSIS — F14251 Cocaine dependence with cocaine-induced psychotic disorder with hallucinations: Secondary | ICD-10-CM | POA: Diagnosis not present

## 2020-10-09 DIAGNOSIS — Z20822 Contact with and (suspected) exposure to covid-19: Secondary | ICD-10-CM | POA: Diagnosis present

## 2020-10-09 DIAGNOSIS — F419 Anxiety disorder, unspecified: Secondary | ICD-10-CM | POA: Diagnosis present

## 2020-10-09 DIAGNOSIS — E785 Hyperlipidemia, unspecified: Secondary | ICD-10-CM | POA: Diagnosis present

## 2020-10-09 DIAGNOSIS — Z7984 Long term (current) use of oral hypoglycemic drugs: Secondary | ICD-10-CM

## 2020-10-09 DIAGNOSIS — Z885 Allergy status to narcotic agent status: Secondary | ICD-10-CM

## 2020-10-09 DIAGNOSIS — E1142 Type 2 diabetes mellitus with diabetic polyneuropathy: Secondary | ICD-10-CM | POA: Diagnosis present

## 2020-10-09 DIAGNOSIS — T50901A Poisoning by unspecified drugs, medicaments and biological substances, accidental (unintentional), initial encounter: Secondary | ICD-10-CM | POA: Diagnosis present

## 2020-10-09 DIAGNOSIS — G40909 Epilepsy, unspecified, not intractable, without status epilepticus: Secondary | ICD-10-CM | POA: Diagnosis present

## 2020-10-09 DIAGNOSIS — Z886 Allergy status to analgesic agent status: Secondary | ICD-10-CM

## 2020-10-09 DIAGNOSIS — K219 Gastro-esophageal reflux disease without esophagitis: Secondary | ICD-10-CM | POA: Diagnosis present

## 2020-10-09 DIAGNOSIS — Z825 Family history of asthma and other chronic lower respiratory diseases: Secondary | ICD-10-CM

## 2020-10-09 DIAGNOSIS — M549 Dorsalgia, unspecified: Secondary | ICD-10-CM | POA: Diagnosis present

## 2020-10-09 DIAGNOSIS — D509 Iron deficiency anemia, unspecified: Secondary | ICD-10-CM | POA: Diagnosis present

## 2020-10-09 DIAGNOSIS — G8929 Other chronic pain: Secondary | ICD-10-CM | POA: Diagnosis present

## 2020-10-09 DIAGNOSIS — R0600 Dyspnea, unspecified: Secondary | ICD-10-CM | POA: Diagnosis present

## 2020-10-09 DIAGNOSIS — F17210 Nicotine dependence, cigarettes, uncomplicated: Secondary | ICD-10-CM | POA: Diagnosis present

## 2020-10-09 LAB — RAPID URINE DRUG SCREEN, HOSP PERFORMED
Amphetamines: NOT DETECTED
Barbiturates: NOT DETECTED
Benzodiazepines: NOT DETECTED
Cocaine: POSITIVE — AB
Opiates: NOT DETECTED
Tetrahydrocannabinol: NOT DETECTED

## 2020-10-09 LAB — CBC WITH DIFFERENTIAL/PLATELET
Abs Immature Granulocytes: 0.03 10*3/uL (ref 0.00–0.07)
Basophils Absolute: 0 10*3/uL (ref 0.0–0.1)
Basophils Relative: 1 %
Eosinophils Absolute: 0 10*3/uL (ref 0.0–0.5)
Eosinophils Relative: 1 %
HCT: 35.3 % — ABNORMAL LOW (ref 36.0–46.0)
Hemoglobin: 10.4 g/dL — ABNORMAL LOW (ref 12.0–15.0)
Immature Granulocytes: 0 %
Lymphocytes Relative: 23 %
Lymphs Abs: 1.9 10*3/uL (ref 0.7–4.0)
MCH: 20.9 pg — ABNORMAL LOW (ref 26.0–34.0)
MCHC: 29.5 g/dL — ABNORMAL LOW (ref 30.0–36.0)
MCV: 70.9 fL — ABNORMAL LOW (ref 80.0–100.0)
Monocytes Absolute: 0.6 10*3/uL (ref 0.1–1.0)
Monocytes Relative: 7 %
Neutro Abs: 5.8 10*3/uL (ref 1.7–7.7)
Neutrophils Relative %: 68 %
Platelets: 338 10*3/uL (ref 150–400)
RBC: 4.98 MIL/uL (ref 3.87–5.11)
RDW: 17.5 % — ABNORMAL HIGH (ref 11.5–15.5)
WBC: 8.4 10*3/uL (ref 4.0–10.5)
nRBC: 0 % (ref 0.0–0.2)

## 2020-10-09 LAB — COMPREHENSIVE METABOLIC PANEL
ALT: 16 U/L (ref 0–44)
AST: 24 U/L (ref 15–41)
Albumin: 4.1 g/dL (ref 3.5–5.0)
Alkaline Phosphatase: 73 U/L (ref 38–126)
Anion gap: 8 (ref 5–15)
BUN: 8 mg/dL (ref 6–20)
CO2: 27 mmol/L (ref 22–32)
Calcium: 9.4 mg/dL (ref 8.9–10.3)
Chloride: 104 mmol/L (ref 98–111)
Creatinine, Ser: 0.66 mg/dL (ref 0.44–1.00)
GFR, Estimated: >60 mL/min (ref >60)
Glucose, Bld: 145 mg/dL — ABNORMAL HIGH (ref 70–99)
Potassium: 4.2 mmol/L (ref 3.5–5.1)
Sodium: 139 mmol/L (ref 135–145)
Total Bilirubin: 0.7 mg/dL (ref 0.3–1.2)
Total Protein: 8 g/dL (ref 6.5–8.1)

## 2020-10-09 LAB — URINALYSIS, ROUTINE W REFLEX MICROSCOPIC
Bilirubin Urine: NEGATIVE
Glucose, UA: NEGATIVE mg/dL
Hgb urine dipstick: NEGATIVE
Ketones, ur: NEGATIVE mg/dL
Leukocytes,Ua: NEGATIVE
Nitrite: NEGATIVE
Protein, ur: NEGATIVE mg/dL
Specific Gravity, Urine: 1.004 — ABNORMAL LOW (ref 1.005–1.030)
pH: 6 (ref 5.0–8.0)

## 2020-10-09 LAB — ACETAMINOPHEN LEVEL: Acetaminophen (Tylenol), Serum: <10 ug/mL — ABNORMAL LOW (ref 10–30)

## 2020-10-09 LAB — SALICYLATE LEVEL: Salicylate Lvl: <7 mg/dL — ABNORMAL LOW (ref 7.0–30.0)

## 2020-10-09 LAB — RESP PANEL BY RT-PCR (FLU A&B, COVID) ARPGX2
Influenza A by PCR: NEGATIVE
Influenza B by PCR: NEGATIVE
SARS Coronavirus 2 by RT PCR: NEGATIVE

## 2020-10-09 MED ORDER — SODIUM CHLORIDE 0.9 % IV BOLUS
1000.0000 mL | Freq: Once | INTRAVENOUS | Status: AC
  Administered 2020-10-10: 1000 mL via INTRAVENOUS

## 2020-10-09 MED ORDER — LORAZEPAM 2 MG/ML IJ SOLN
0.5000 mg | Freq: Once | INTRAMUSCULAR | Status: AC
  Administered 2020-10-09: 0.5 mg via INTRAMUSCULAR
  Filled 2020-10-09: qty 1

## 2020-10-09 NOTE — ED Triage Notes (Addendum)
Per EMS-states daughter called EMS-states patient used crack today-was all over the place when EMS arrived-now refusing to talk-easily agitated-cbg [id number]

## 2020-10-09 NOTE — ED Notes (Signed)
Try to get blood from pt but pt keeps moving.

## 2020-10-09 NOTE — ED Provider Notes (Addendum)
[facility] DEPT Provider Note   CSN: [id number] Arrival date & time: 10/09/20  1707     History Chief Complaint  Patient presents with  . Addiction Problem    [name] is a 55 y.o. female.  HPI   55 year old female with a history of accidental drug overdose, A. fib, COPD, diabetes, dyspnea, GERD, hypertension, hyperlipidemia, mental disorder, schizophrenia, who presents to the emergency department today for evaluation of altered mental status.  Level 5 caveat as patient unable to answer any questions.  Discussed case with her daughter who reports the patient has a history of polysubstance use and was at their home with a friend named Reggie that she frequently does illicit drugs with.  She does not know if the patient ingested anything today as this man would not tell her what happened.  Past Medical History:  Diagnosis Date  . Abdominal pain   . Accidental drug overdose April 2013  . Anxiety   . Atrial fibrillation ([place]) 09/29/11   converted spontaneously  . Chronic back pain   . Chronic knee pain   . Chronic nausea   . Chronic pain   . COPD (chronic obstructive pulmonary disease) ([place])   . Depression   . Diabetes mellitus    states her doctor took her off all DM meds in past month  . Diabetic neuropathy ([place])   . Dyspnea    with exertion   . GERD (gastroesophageal reflux disease)   . Headache(784.0)    migraines   . HTN (hypertension)    not on meds since in a year   . Hyperlipidemia   . Hypothyroidism    not on meds in a while   . Mental disorder    Bipolar and schizophrenic  . Requires supplemental oxygen    as needed per patient   . Schizophrenia ([place])   . Schizophrenia, acute ([place]) 11/13/2017  . Tobacco abuse     Patient Active Problem List   Diagnosis Date Noted  . Polysubstance (excluding opioids) dependence, daily use ([place]) 08/04/2018  . Substance induced mood disorder ([place]) 08/03/2018  . Schizoaffective disorder,  bipolar type ([place]) 11/13/2017  . Polysubstance abuse ([place]) 11/13/2017  . 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 Abdominal pain 04/18/2016  . Dehydration 04/17/2016  . Morbid obesity ([place]) 03/25/2016  . Type 2 diabetes mellitus with diabetic polyneuropathy, with long-term current use of insulin ([place]) 09/06/2015  . Vaginitis and vulvovaginitis, unspecified 04/19/2013  . BV (bacterial vaginosis) 04/19/2013  . HTN (hypertension) 11/04/2011  . Hypokalemia 10/02/2011  . atrial fibrillation 09/30/2011  . Acute respiratory failure ([place]) 09/29/2011  . COPD exacerbation ([place]) 09/29/2011  . Accidental drug overdose 09/29/2011    Past Surgical History:  Procedure Laterality Date  . ABDOMINAL HYSTERECTOMY    . BLADDER SUSPENSION  03/04/2011   Procedure: [facility] PROCEDURE;  Surgeon: Elayne Snare MacDiarmid;  Location: [facility];  Service: Urology;  Laterality: N/A;  . CYSTOCELE REPAIR  03/04/2011   Procedure: ANTERIOR REPAIR (CYSTOCELE);  Surgeon: Reece Packer;  Location: [facility];  Service: Urology;  Laterality: N/A;  . CYSTOSCOPY  03/04/2011   Procedure: CYSTOSCOPY;  Surgeon: Elayne Snare MacDiarmid;  Location: [facility];  Service: Urology;  Laterality: N/A;  . ESOPHAGOGASTRODUODENOSCOPY (EGD) WITH PROPOFOL N/A 05/12/2017   Procedure: ESOPHAGOGASTRODUODENOSCOPY (EGD) WITH PROPOFOL;  Surgeon: Alphonsa Overall, MD;  Location: [facility];  Service: General;  Laterality: N/A;  . GASTRIC ROUX-EN-Y N/A 03/25/2016   Procedure:  LAPAROSCOPIC ROUX-EN-Y GASTRIC BYPASS WITH UPPER ENDOSCOPY;  Surgeon: Excell Seltzer, MD;  Location: [facility];  Service: General;  Laterality: N/A;  . KNEE SURGERY    . LAPAROSCOPIC ASSISTED VAGINAL HYSTERECTOMY  03/04/2011   Procedure: LAPAROSCOPIC ASSISTED VAGINAL HYSTERECTOMY;  Surgeon: Cyril Mourning, MD;  Location: [facility];  Service: Gynecology;  Laterality: N/A;     OB History    Gravida  3   Para  3   Term      Preterm      AB  0    Living  3     SAB  0   IAB  0   Ectopic  0   Multiple  0   Live Births              Family History  Problem Relation Age of Onset  . Heart attack Father        43s  . Diabetes Mother   . Heart disease Mother   . Hypertension Mother   . Heart attack Sister        1  . COPD Other   . Breast cancer Neg Hx     Social History   Tobacco Use  . Smoking status: Current Every Day Smoker    Packs/day: 0.50    Types: Cigarettes  . Smokeless tobacco: Never Used  Vaping Use  . Vaping Use: Never used  Substance Use Topics  . Alcohol use: Yes    Comment: daily  . Drug use: Yes    Types: Cocaine, Marijuana, "Crack" cocaine    Home Medications Prior to Admission medications   Medication Sig Start Date End Date Taking? Authorizing Provider  ABILIFY MAINTENA 400 MG PRSY prefilled syringe Inject 400 mg as directed every 28 (twenty-eight) days. 07/12/18   [provider]  albuterol (PROAIR HFA) 108 (90 Base) MCG/ACT inhaler Inhale 2 puffs into the lungs every 6 (six) hours as needed for wheezing or shortness of breath. 11/16/17   Lindell Spar I, NP  ALPRAZolam Duanne Moron) 1 MG tablet Take 1 tablet (1 mg total) by mouth 2 (two) times daily as needed for anxiety. 07/27/20   Florencia Reasons, MD  blood glucose meter kit and supplies KIT Dispense based on patient and insurance preference. Use up to four times daily as directed. (FOR ICD-9 250.00, 250.01). 07/27/20   Florencia Reasons, MD  Cholecalciferol 25 MCG (1000 UT) capsule Take 1 capsule (1,000 Units total) by mouth daily. 08/03/20   Florencia Reasons, MD  ferrous sulfate 325 (65 FE) MG tablet Take 1 tablet (325 mg total) by mouth daily with breakfast. 07/28/20   Florencia Reasons, MD  gabapentin (NEURONTIN) 300 MG capsule Take 1 capsule (300 mg total) by mouth 3 (three) times daily. 08/06/18 08/06/19  Johnn Hai, MD  guaiFENesin (MUCINEX) 600 MG 12 hr tablet Take 1 tablet (600 mg total) by mouth 2 (two) times daily. 07/27/20   Florencia Reasons, MD  ibuprofen (ADVIL) 800 MG  tablet Take 800 mg by mouth 3 (three) times daily as needed for pain. 07/03/20   [provider]  insulin aspart (NOVOLOG) 100 UNIT/ML FlexPen Insulin sliding scale: Blood sugar  120-150   3units                       151-200   4units                       201-250   7units  251- 300  11units                       301-350   15uints                       351-400   20units                       >400         call MD immediately 07/27/20   Florencia Reasons, MD  Insulin Pen Needle 29G X 12MM MISC novolog sliding scale insulin three times a day as needed 07/27/20   Florencia Reasons, MD  ipratropium-albuterol (DUONEB) 0.5-2.5 (3) MG/3ML SOLN Take 3 mLs by nebulization every 6 (six) hours as needed for wheezing or shortness of breath. 06/06/18   [provider]  metFORMIN (GLUCOPHAGE) 500 MG tablet Take 1 tablet (500 mg total) by mouth 2 (two) times daily with a meal. 07/27/20 07/27/21  Florencia Reasons, MD  Multiple Vitamin (MULTIVITAMIN WITH MINERALS) TABS tablet Take 1 tablet by mouth 2 (two) times daily. 07/27/20   Florencia Reasons, MD  nicotine (NICODERM CQ - DOSED IN MG/24 HOURS) 21 mg/24hr patch Place 1 patch (21 mg total) onto the skin daily. (May purchase from over the counter): For nicotine withdrawal symptoms Patient not taking: No sig reported 11/17/17   Lindell Spar I, NP  predniSONE (DELTASONE) 10 MG tablet Label  & dispense according to the schedule below. 5Pills PO on day one then, 4 Pills PO on day two, 3 Pills PO on day three, 2Pills PO on day four, 1Pills PO on day five,  then STOP.  Total of 15 tabs 07/27/20   Florencia Reasons, MD  [facility] 160-4.5 MCG/ACT inhaler Inhale 2 puffs into the lungs daily as needed (sob and wheezing). 11/16/17   Lindell Spar I, NP  tiotropium (SPIRIVA HANDIHALER) 18 MCG inhalation capsule Place 1 capsule (18 mcg total) into inhaler and inhale daily. For COPD 11/17/17   Lindell Spar I, NP    Allergies    Morphine and related and Aspirin  Review of Systems   Review of  Systems  Unable to perform ROS: Mental status change    Physical Exam Updated Vital Signs BP (!) 142/84   Pulse (!) 101   Temp 100.2 F (37.9 C) (Rectal)   Resp 18   LMP 01/08/2011   SpO2 99%   Physical Exam Vitals and nursing note reviewed.  Constitutional:      General: She is not in acute distress.    Appearance: She is well-developed.  HENT:     Head: Normocephalic and atraumatic.  Eyes:     Extraocular Movements: Extraocular movements intact.     Conjunctiva/sclera: Conjunctivae normal.     Pupils: Pupils are equal, round, and reactive to light.  Cardiovascular:     Rate and Rhythm: Normal rate and regular rhythm.     Heart sounds: Normal heart sounds. No murmur heard.   Pulmonary:     Effort: Pulmonary effort is normal. No respiratory distress.     Breath sounds: Normal breath sounds. No wheezing, rhonchi or rales.  Abdominal:     General: Bowel sounds are normal.     Palpations: Abdomen is soft.     Tenderness: There is no abdominal tenderness.  Musculoskeletal:     Cervical back: Neck supple.  Skin:    General: Skin is warm and dry.  Neurological:  Mental Status: She is alert.     Comments: Somnolent but when aroused tries to get up out of the chair.  Moving all extremities.  Will not respond to question.  Pupils are equal round and reactive to light.  Patient does track.     ED Results / Procedures / Treatments   Labs (all labs ordered are listed, but only abnormal results are displayed) Labs Reviewed  CBC WITH DIFFERENTIAL/PLATELET - Abnormal; Notable for the following components:      Result Value   Hemoglobin 10.4 (*)    HCT 35.3 (*)    MCV 70.9 (*)    MCH 20.9 (*)    MCHC 29.5 (*)    RDW 17.5 (*)    All other components within normal limits  COMPREHENSIVE METABOLIC PANEL - Abnormal; Notable for the following components:   Glucose, Bld 145 (*)    All other components within normal limits  URINALYSIS, ROUTINE W REFLEX MICROSCOPIC -  Abnormal; Notable for the following components:   Color, Urine STRAW (*)    Specific Gravity, Urine 1.004 (*)    All other components within normal limits  RAPID URINE DRUG SCREEN, HOSP PERFORMED - Abnormal; Notable for the following components:   Cocaine POSITIVE (*)    All other components within normal limits  ACETAMINOPHEN LEVEL - Abnormal; Notable for the following components:   Acetaminophen (Tylenol), Serum <10 (*)    All other components within normal limits  SALICYLATE LEVEL - Abnormal; Notable for the following components:   Salicylate Lvl <1.7 (*)    All other components within normal limits  RESP PANEL BY RT-PCR (FLU A&B, COVID) [id number]    EKG None  Radiology CT Head Wo Contrast  Result Date: 10/09/2020 CLINICAL DATA:  Delirium, utilized crack cocaine EXAM: CT HEAD WITHOUT CONTRAST TECHNIQUE: Contiguous axial images were obtained from the base of the skull through the vertex without intravenous contrast. COMPARISON:  None. FINDINGS: Brain: No evidence of acute infarction, hemorrhage, hydrocephalus, extra-axial collection, visible mass lesion or mass effect. Vascular: Atherosclerotic calcification of the carotid siphons. No hyperdense vessel. Skull: No calvarial fracture or suspicious osseous lesion. No scalp swelling or hematoma. Sinuses/Orbits: Paranasal sinuses are predominantly clear. Trace left mastoid effusion. Middle ear cavities are clear. Included orbital structures are unremarkable. Other: None IMPRESSION: 1. No acute intracranial abnormality. 2. Trace left mastoid effusion. Electronically Signed   By: Lovena Le M.D.   On: 10/09/2020 23:05   DG Chest Portable 1 View  Result Date: 10/09/2020 CLINICAL DATA:  cough, combative, held for x-ray EXAM: PORTABLE CHEST 1 VIEW COMPARISON:  07/24/2020 FINDINGS: No consolidation, features of edema, pneumothorax, or effusion. Pulmonary vascularity is normally distributed. The cardiomediastinal contours are unremarkable. No acute  osseous or soft tissue abnormality. IMPRESSION: No acute cardiopulmonary abnormality. Electronically Signed   By: Lovena Le M.D.   On: 10/09/2020 22:23    Procedures Procedures   Medications Ordered in ED Medications  sodium chloride 0.9 % bolus 1,000 mL (0 mLs Intravenous Hold 10/09/20 2353)  LORazepam (ATIVAN) injection 0.5 mg (0.5 mg Intramuscular Given 10/09/20 2050)    ED Course  I have reviewed the triage vital signs and the nursing notes.  Pertinent labs & imaging results that were available during my care of the patient were reviewed by me and considered in my medical decision making (see chart for details).    MDM Rules/Calculators/A&P  55 year old female presenting for evaluation of altered mental status.  Has history of polysubstance abuse.  Noted to be altered today and hanging out with a friend she frequently does drugs with.  No further history able to be obtained as patient cannot answer questions.  CBC is without leukocytosis, anemia is present but chronic CMP is unremarkable Acetaminophen and salicylate levels are negative UA is negative UDS positive for cocaine COVID neg  CXR neg - No acute cardiopulmonary abnormality. CT head neg - 1. No acute intracranial abnormality. 2. Trace left mastoid effusion.   Patient with altered mental status.  Suspect polysubstance use.  She also has a borderline temp, she has been coughing since she got to the ED.  I suspect she has a viral upper respiratory infection.  She did not have any focal deficits during my evaluation.  Low Suspicion for meningitis.  11:53 PM CONSULT with Dr. Myna Hidalgo who accepts patient for admission    Final Clinical Impression(s) / ED Diagnoses Final diagnoses:  Altered mental status, unspecified altered mental status type    Rx / DC Orders ED Discharge Orders    None       Rodney Booze, PA-C 10/09/20 2354    Cressie Betzler S, PA-C 10/10/20 0002    Milton Ferguson, MD 10/16/20 1212

## 2020-10-10 ENCOUNTER — Other Ambulatory Visit: Payer: Self-pay

## 2020-10-10 ENCOUNTER — Encounter (HOSPITAL_COMMUNITY): Payer: Self-pay | Admitting: Family Medicine

## 2020-10-10 DIAGNOSIS — J449 Chronic obstructive pulmonary disease, unspecified: Secondary | ICD-10-CM | POA: Diagnosis present

## 2020-10-10 DIAGNOSIS — F14251 Cocaine dependence with cocaine-induced psychotic disorder with hallucinations: Secondary | ICD-10-CM | POA: Diagnosis present

## 2020-10-10 DIAGNOSIS — G9340 Encephalopathy, unspecified: Secondary | ICD-10-CM | POA: Diagnosis present

## 2020-10-10 DIAGNOSIS — Z20822 Contact with and (suspected) exposure to covid-19: Secondary | ICD-10-CM | POA: Diagnosis present

## 2020-10-10 DIAGNOSIS — K219 Gastro-esophageal reflux disease without esophagitis: Secondary | ICD-10-CM | POA: Diagnosis present

## 2020-10-10 DIAGNOSIS — Z9884 Bariatric surgery status: Secondary | ICD-10-CM | POA: Diagnosis not present

## 2020-10-10 DIAGNOSIS — F419 Anxiety disorder, unspecified: Secondary | ICD-10-CM | POA: Diagnosis present

## 2020-10-10 DIAGNOSIS — Z794 Long term (current) use of insulin: Secondary | ICD-10-CM | POA: Diagnosis not present

## 2020-10-10 DIAGNOSIS — E1142 Type 2 diabetes mellitus with diabetic polyneuropathy: Secondary | ICD-10-CM | POA: Diagnosis present

## 2020-10-10 DIAGNOSIS — R0600 Dyspnea, unspecified: Secondary | ICD-10-CM | POA: Diagnosis present

## 2020-10-10 DIAGNOSIS — G40909 Epilepsy, unspecified, not intractable, without status epilepticus: Secondary | ICD-10-CM | POA: Diagnosis present

## 2020-10-10 DIAGNOSIS — Z833 Family history of diabetes mellitus: Secondary | ICD-10-CM | POA: Diagnosis not present

## 2020-10-10 DIAGNOSIS — I10 Essential (primary) hypertension: Secondary | ICD-10-CM | POA: Diagnosis present

## 2020-10-10 DIAGNOSIS — Z79899 Other long term (current) drug therapy: Secondary | ICD-10-CM | POA: Diagnosis not present

## 2020-10-10 DIAGNOSIS — Z825 Family history of asthma and other chronic lower respiratory diseases: Secondary | ICD-10-CM | POA: Diagnosis not present

## 2020-10-10 DIAGNOSIS — D509 Iron deficiency anemia, unspecified: Secondary | ICD-10-CM | POA: Diagnosis present

## 2020-10-10 DIAGNOSIS — F250 Schizoaffective disorder, bipolar type: Secondary | ICD-10-CM | POA: Diagnosis present

## 2020-10-10 DIAGNOSIS — R509 Fever, unspecified: Secondary | ICD-10-CM | POA: Diagnosis not present

## 2020-10-10 DIAGNOSIS — E785 Hyperlipidemia, unspecified: Secondary | ICD-10-CM | POA: Diagnosis present

## 2020-10-10 DIAGNOSIS — I4891 Unspecified atrial fibrillation: Secondary | ICD-10-CM | POA: Diagnosis present

## 2020-10-10 DIAGNOSIS — Z7984 Long term (current) use of oral hypoglycemic drugs: Secondary | ICD-10-CM | POA: Diagnosis not present

## 2020-10-10 DIAGNOSIS — G8929 Other chronic pain: Secondary | ICD-10-CM | POA: Diagnosis present

## 2020-10-10 DIAGNOSIS — Z8249 Family history of ischemic heart disease and other diseases of the circulatory system: Secondary | ICD-10-CM | POA: Diagnosis not present

## 2020-10-10 DIAGNOSIS — M549 Dorsalgia, unspecified: Secondary | ICD-10-CM | POA: Diagnosis present

## 2020-10-10 DIAGNOSIS — F17210 Nicotine dependence, cigarettes, uncomplicated: Secondary | ICD-10-CM | POA: Diagnosis present

## 2020-10-10 DIAGNOSIS — T50901A Poisoning by unspecified drugs, medicaments and biological substances, accidental (unintentional), initial encounter: Secondary | ICD-10-CM | POA: Diagnosis present

## 2020-10-10 LAB — BASIC METABOLIC PANEL
Anion gap: 10 (ref 5–15)
BUN: 8 mg/dL (ref 6–20)
CO2: 25 mmol/L (ref 22–32)
Calcium: 9 mg/dL (ref 8.9–10.3)
Chloride: 107 mmol/L (ref 98–111)
Creatinine, Ser: 0.6 mg/dL (ref 0.44–1.00)
GFR, Estimated: >60 mL/min (ref >60)
Glucose, Bld: 143 mg/dL — ABNORMAL HIGH (ref 70–99)
Potassium: 4.2 mmol/L (ref 3.5–5.1)
Sodium: 142 mmol/L (ref 135–145)

## 2020-10-10 LAB — CBC
HCT: 33.2 % — ABNORMAL LOW (ref 36.0–46.0)
Hemoglobin: 9.8 g/dL — ABNORMAL LOW (ref 12.0–15.0)
MCH: 21.2 pg — ABNORMAL LOW (ref 26.0–34.0)
MCHC: 29.5 g/dL — ABNORMAL LOW (ref 30.0–36.0)
MCV: 71.9 fL — ABNORMAL LOW (ref 80.0–100.0)
Platelets: 319 10*3/uL (ref 150–400)
RBC: 4.62 MIL/uL (ref 3.87–5.11)
RDW: 17.8 % — ABNORMAL HIGH (ref 11.5–15.5)
WBC: 9.2 10*3/uL (ref 4.0–10.5)
nRBC: 0 % (ref 0.0–0.2)

## 2020-10-10 LAB — ETHANOL: Alcohol, Ethyl (B): <10 mg/dL (ref <10)

## 2020-10-10 LAB — VITAMIN B12: Vitamin B-12: 409 pg/mL (ref 180–914)

## 2020-10-10 LAB — HIV ANTIBODY (ROUTINE TESTING W REFLEX): HIV Screen 4th Generation wRfx: NONREACTIVE

## 2020-10-10 LAB — GLUCOSE, CAPILLARY
Glucose-Capillary: 130 mg/dL — ABNORMAL HIGH (ref 70–99)
Glucose-Capillary: 156 mg/dL — ABNORMAL HIGH (ref 70–99)
Glucose-Capillary: 78 mg/dL (ref 70–99)

## 2020-10-10 LAB — AMMONIA: Ammonia: 18 umol/L (ref 9–35)

## 2020-10-10 LAB — TSH: TSH: 2.72 u[IU]/mL (ref 0.350–4.500)

## 2020-10-10 MED ORDER — INSULIN ASPART 100 UNIT/ML ~~LOC~~ SOLN
0.0000 [IU] | Freq: Three times a day (TID) | SUBCUTANEOUS | Status: DC
  Administered 2020-10-10: 1 [IU] via SUBCUTANEOUS
  Administered 2020-10-11 (×2): 2 [IU] via SUBCUTANEOUS
  Filled 2020-10-10: qty 0.09

## 2020-10-10 MED ORDER — LORAZEPAM 2 MG/ML IJ SOLN
1.0000 mg | Freq: Once | INTRAMUSCULAR | Status: AC
  Administered 2020-10-10: 1 mg via INTRAVENOUS

## 2020-10-10 MED ORDER — LORAZEPAM 2 MG/ML IJ SOLN
0.5000 mg | Freq: Four times a day (QID) | INTRAMUSCULAR | Status: DC | PRN
  Administered 2020-10-10 – 2020-10-11 (×3): 0.5 mg via INTRAVENOUS
  Filled 2020-10-10 (×3): qty 1

## 2020-10-10 MED ORDER — ACETAMINOPHEN 650 MG RE SUPP: 650.0000 mg | Freq: Four times a day (QID) | RECTAL | Status: DC | PRN

## 2020-10-10 MED ORDER — MOMETASONE FURO-FORMOTEROL FUM 200-5 MCG/ACT IN AERO
2.0000 | INHALATION_SPRAY | Freq: Two times a day (BID) | RESPIRATORY_TRACT | Status: DC
  Administered 2020-10-10 – 2020-10-11 (×2): 2 via RESPIRATORY_TRACT
  Filled 2020-10-10: qty 8.8

## 2020-10-10 MED ORDER — HALOPERIDOL LACTATE 5 MG/ML IJ SOLN
3.0000 mg | Freq: Four times a day (QID) | INTRAMUSCULAR | Status: DC | PRN
  Administered 2020-10-10: 3 mg via INTRAVENOUS
  Filled 2020-10-10: qty 1

## 2020-10-10 MED ORDER — HALOPERIDOL LACTATE 5 MG/ML IJ SOLN
INTRAMUSCULAR | Status: AC
  Filled 2020-10-10: qty 1

## 2020-10-10 MED ORDER — LOPERAMIDE HCL 2 MG PO CAPS: 2.0000 mg | ORAL_CAPSULE | ORAL | Status: DC | PRN

## 2020-10-10 MED ORDER — NICOTINE 14 MG/24HR TD PT24
14.0000 mg | MEDICATED_PATCH | Freq: Every day | TRANSDERMAL | Status: DC
  Administered 2020-10-10 – 2020-10-11 (×2): 14 mg via TRANSDERMAL
  Filled 2020-10-10 (×2): qty 1

## 2020-10-10 MED ORDER — ONDANSETRON 4 MG PO TBDP: 4.0000 mg | ORAL_TABLET | Freq: Four times a day (QID) | ORAL | Status: DC | PRN

## 2020-10-10 MED ORDER — LORAZEPAM 2 MG/ML IJ SOLN: 0.5000 mg | INTRAMUSCULAR | Status: DC | PRN

## 2020-10-10 MED ORDER — HALOPERIDOL LACTATE 5 MG/ML IJ SOLN: 5.0000 mg | Freq: Once | INTRAMUSCULAR | Status: AC

## 2020-10-10 MED ORDER — HYDROXYZINE HCL 25 MG PO TABS
25.0000 mg | ORAL_TABLET | Freq: Four times a day (QID) | ORAL | Status: DC | PRN
  Administered 2020-10-10 – 2020-10-11 (×2): 25 mg via ORAL
  Filled 2020-10-10 (×2): qty 1

## 2020-10-10 MED ORDER — DICYCLOMINE HCL 20 MG PO TABS
20.0000 mg | ORAL_TABLET | Freq: Four times a day (QID) | ORAL | Status: DC | PRN
Start: 2020-10-10 — End: 2020-10-12
  Filled 2020-10-10: qty 1

## 2020-10-10 MED ORDER — ALPRAZOLAM 1 MG PO TABS
1.0000 mg | ORAL_TABLET | Freq: Two times a day (BID) | ORAL | Status: DC | PRN
  Administered 2020-10-10 – 2020-10-11 (×3): 1 mg via ORAL
  Filled 2020-10-10 (×3): qty 1

## 2020-10-10 MED ORDER — RISPERIDONE 1 MG PO TABS
1.0000 mg | ORAL_TABLET | Freq: Two times a day (BID) | ORAL | Status: DC
  Administered 2020-10-10 – 2020-10-11 (×3): 1 mg via ORAL
  Filled 2020-10-10 (×3): qty 1

## 2020-10-10 MED ORDER — ALBUTEROL SULFATE HFA 108 (90 BASE) MCG/ACT IN AERS: 2.0000 | INHALATION_SPRAY | Freq: Four times a day (QID) | RESPIRATORY_TRACT | Status: DC | PRN

## 2020-10-10 MED ORDER — HALOPERIDOL LACTATE 5 MG/ML IJ SOLN
5.0000 mg | Freq: Four times a day (QID) | INTRAMUSCULAR | Status: DC | PRN
  Administered 2020-10-10 – 2020-10-11 (×3): 5 mg via INTRAMUSCULAR
  Filled 2020-10-10 (×2): qty 1

## 2020-10-10 MED ORDER — METHOCARBAMOL 500 MG PO TABS
500.0000 mg | ORAL_TABLET | Freq: Three times a day (TID) | ORAL | Status: DC | PRN
  Administered 2020-10-11: 500 mg via ORAL
  Filled 2020-10-10: qty 1

## 2020-10-10 MED ORDER — UMECLIDINIUM BROMIDE 62.5 MCG/INH IN AEPB
1.0000 | INHALATION_SPRAY | Freq: Every day | RESPIRATORY_TRACT | Status: DC
  Administered 2020-10-10: 1 via RESPIRATORY_TRACT
  Filled 2020-10-10: qty 7

## 2020-10-10 MED ORDER — LORAZEPAM 2 MG/ML IJ SOLN: 1.0000 mg | Freq: Once | INTRAMUSCULAR | Status: DC

## 2020-10-10 MED ORDER — TIOTROPIUM BROMIDE MONOHYDRATE 18 MCG IN CAPS
18.0000 ug | ORAL_CAPSULE | Freq: Every day | RESPIRATORY_TRACT | Status: DC
Start: 2020-10-10 — End: 2020-10-10

## 2020-10-10 MED ORDER — ENOXAPARIN SODIUM 30 MG/0.3ML ~~LOC~~ SOLN
30.0000 mg | SUBCUTANEOUS | Status: DC
  Administered 2020-10-11: 30 mg via SUBCUTANEOUS
  Filled 2020-10-10 (×2): qty 0.3

## 2020-10-10 MED ORDER — INSULIN ASPART 100 UNIT/ML ~~LOC~~ SOLN
0.0000 [IU] | Freq: Every day | SUBCUTANEOUS | Status: DC
  Filled 2020-10-10: qty 0.05

## 2020-10-10 MED ORDER — SENNOSIDES-DOCUSATE SODIUM 8.6-50 MG PO TABS: 1.0000 | ORAL_TABLET | Freq: Every evening | ORAL | Status: DC | PRN

## 2020-10-10 MED ORDER — NAPROXEN 250 MG PO TABS
500.0000 mg | ORAL_TABLET | Freq: Two times a day (BID) | ORAL | Status: DC | PRN
  Filled 2020-10-10: qty 2

## 2020-10-10 MED ORDER — ACETAMINOPHEN 325 MG PO TABS: 650.0000 mg | ORAL_TABLET | Freq: Four times a day (QID) | ORAL | Status: DC | PRN

## 2020-10-10 MED ORDER — SODIUM CHLORIDE 0.9 % IV SOLN: INTRAVENOUS | Status: DC
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
## 2020-10-10 NOTE — H&P (Signed)
History and Physical    [name]:[id number] DOB: [birth year] DOA: 10/09/2020  PCP: [name], MD   Patient coming from: Home   Chief Complaint: Confusion, agitation   HPI: [name] is a 55 y.o. female with medical history significant for polysubstance abuse, schizoaffective disorder, COPD, and insulin-dependent diabetes mellitus, presenting to the emergency department for evaluation of confusion and agitation.  She is accompanied by a daughter who assists with the history.  Patient has been living alone while her husband is staying with his ill parents, seem to be in her usual state yesterday when she spoke with her daughter on the phone, was noted to be a little bit difficult to understand and possibly confused when she spoke with her husband on the phone at approximately 1 PM, and then her daughter received a phone call from the patient's friend later in the afternoon reporting that the patient was not acting like her usual self.  EMS was called and the patient's daughter arrived on the scene at the same time as EMS.  Patient had her pants around her ankles and was having trouble pulling them up, did not recognize her daughter, was agitated, and could not be redirected.  Patient had been with her friend who she is known to have abused illicit substances with in the past but he was unwilling to provide much information today.  Patient had not been complaining of anything last time she had spoken with family.  At her baseline, she is alert, fully oriented, able to care for herself, drives, and pays her own bills.  ED Course: Upon arrival to the ED, patient is found to be afebrile, slightly tachycardic, saturating well on room air, and with stable blood pressure.  EKG features sinus tachycardia with rate 117 and QTc interval 418 ms.  Chest x-ray negative for acute cardiopulmonary disease.  Head CT negative for acute intracranial abnormality.  CMP unremarkable.  CBC with stable  microcytic anemia.  UDS positive for cocaine only.  Salicylate and acetaminophen levels undetectable.  Urinalysis notable for low specific gravity.  COVID-19 screening test was negative.  The patient was given a liter of saline and IM Ativan in the ED.  Review of Systems:  ROS limited by patient's clinical condition.  Past Medical History:  Diagnosis Date  . Abdominal pain   . Accidental drug overdose April 2013  . Anxiety   . Atrial fibrillation ([place]) 09/29/11   converted spontaneously  . Chronic back pain   . Chronic knee pain   . Chronic nausea   . Chronic pain   . COPD (chronic obstructive pulmonary disease) ([place])   . Depression   . Diabetes mellitus    states her doctor took her off all DM meds in past month  . Diabetic neuropathy ([place])   . Dyspnea    with exertion   . GERD (gastroesophageal reflux disease)   . Headache(784.0)    migraines   . HTN (hypertension)    not on meds since in a year   . Hyperlipidemia   . Hypothyroidism    not on meds in a while   . Mental disorder    Bipolar and schizophrenic  . Requires supplemental oxygen    as needed per patient   . Schizophrenia ([place])   . Schizophrenia, acute ([place]) 11/13/2017  . Tobacco abuse     Past Surgical History:  Procedure Laterality Date  . ABDOMINAL HYSTERECTOMY    . BLADDER SUSPENSION  03/04/2011  Procedure: [facility] PROCEDURE;  Surgeon: Elayne Snare MacDiarmid;  Location: [facility];  Service: Urology;  Laterality: N/A;  . CYSTOCELE REPAIR  03/04/2011   Procedure: ANTERIOR REPAIR (CYSTOCELE);  Surgeon: Reece Packer;  Location: [facility];  Service: Urology;  Laterality: N/A;  . CYSTOSCOPY  03/04/2011   Procedure: CYSTOSCOPY;  Surgeon: Elayne Snare MacDiarmid;  Location: [facility];  Service: Urology;  Laterality: N/A;  . ESOPHAGOGASTRODUODENOSCOPY (EGD) WITH PROPOFOL N/A 05/12/2017   Procedure: ESOPHAGOGASTRODUODENOSCOPY (EGD) WITH PROPOFOL;  Surgeon: Alphonsa Overall, MD;  Location: [facility];  Service: General;  Laterality:  N/A;  . GASTRIC ROUX-EN-Y N/A 03/25/2016   Procedure: LAPAROSCOPIC ROUX-EN-Y GASTRIC BYPASS WITH UPPER ENDOSCOPY;  Surgeon: Excell Seltzer, MD;  Location: [facility];  Service: General;  Laterality: N/A;  . KNEE SURGERY    . LAPAROSCOPIC ASSISTED VAGINAL HYSTERECTOMY  03/04/2011   Procedure: LAPAROSCOPIC ASSISTED VAGINAL HYSTERECTOMY;  Surgeon: Cyril Mourning, MD;  Location: [facility];  Service: Gynecology;  Laterality: N/A;    Social History:   reports that she has been smoking cigarettes. She has been smoking about 0.50 packs per day. She has never used smokeless tobacco. She reports current alcohol use. She reports current drug use. Drugs: Cocaine, Marijuana, and "Crack" cocaine.  Allergies  Allergen Reactions  . Morphine And Related Nausea And Vomiting and Other (See Comments)    Pt stated she is not allergic to medication  . Aspirin Nausea And Vomiting and Other (See Comments)    Ok to take tylenol or ibuprofen    Family History  Problem Relation Age of Onset  . Heart attack Father        38s  . Diabetes Mother   . Heart disease Mother   . Hypertension Mother   . Heart attack Sister        64  . COPD Other   . Breast cancer Neg Hx      Prior to Admission medications   Medication Sig Start Date End Date Taking? Authorizing Provider  ABILIFY MAINTENA 400 MG PRSY prefilled syringe Inject 400 mg as directed every 28 (twenty-eight) days. 07/12/18  Yes [provider]  albuterol (PROAIR HFA) 108 (90 Base) MCG/ACT inhaler Inhale 2 puffs into the lungs every 6 (six) hours as needed for wheezing or shortness of breath. 11/16/17  Yes Nwoko, Loleta Dicker, NP  ALPRAZolam (XANAX) 1 MG tablet Take 1 tablet (1 mg total) by mouth 2 (two) times daily as needed for anxiety. 07/27/20  Yes Florencia Reasons, MD  Cholecalciferol 25 MCG (1000 UT) capsule Take 1 capsule (1,000 Units total) by mouth daily. 08/03/20  Yes Florencia Reasons, MD  ferrous sulfate 325 (65 FE) MG tablet Take 1 tablet (325 mg total) by mouth  daily with breakfast. 07/28/20  Yes Florencia Reasons, MD  guaiFENesin (MUCINEX) 600 MG 12 hr tablet Take 1 tablet (600 mg total) by mouth 2 (two) times daily. 07/27/20  Yes Florencia Reasons, MD  ibuprofen (ADVIL) 800 MG tablet Take 800 mg by mouth 3 (three) times daily as needed for pain. 07/03/20  Yes [provider]  insulin aspart (NOVOLOG) 100 UNIT/ML FlexPen Insulin sliding scale: Blood sugar  120-150   3units                       151-200   4units                       201-250   7units  251- 300  11units                       301-350   15uints                       351-400   20units                       >400         call MD immediately 07/27/20  Yes Florencia Reasons, MD  ipratropium-albuterol (DUONEB) 0.5-2.5 (3) MG/3ML SOLN Take 3 mLs by nebulization every 6 (six) hours as needed for wheezing or shortness of breath. 06/06/18  Yes [provider]  metFORMIN (GLUCOPHAGE) 500 MG tablet Take 1 tablet (500 mg total) by mouth 2 (two) times daily with a meal. 07/27/20 07/27/21 Yes Florencia Reasons, MD  Multiple Vitamin (MULTIVITAMIN WITH MINERALS) TABS tablet Take 1 tablet by mouth 2 (two) times daily. 07/27/20  Yes Florencia Reasons, MD  SYMBICORT 160-4.5 MCG/ACT inhaler Inhale 2 puffs into the lungs daily as needed (sob and wheezing). 11/16/17  Yes Lindell Spar I, NP  tiotropium (SPIRIVA HANDIHALER) 18 MCG inhalation capsule Place 1 capsule (18 mcg total) into inhaler and inhale daily. For COPD 11/17/17  Yes Lindell Spar I, NP  blood glucose meter kit and supplies KIT Dispense based on patient and insurance preference. Use up to four times daily as directed. (FOR ICD-9 250.00, 250.01). 07/27/20   Florencia Reasons, MD  gabapentin (NEURONTIN) 300 MG capsule Take 1 capsule (300 mg total) by mouth 3 (three) times daily. Patient not taking: Reported on 10/10/2020 08/06/18 08/06/19  Johnn Hai, MD  Insulin Pen Needle 29G X 12MM MISC novolog sliding scale insulin three times a day as needed 07/27/20   Florencia Reasons, MD  nicotine  (NICODERM CQ - DOSED IN MG/24 HOURS) 21 mg/24hr patch Place 1 patch (21 mg total) onto the skin daily. (May purchase from over the counter): For nicotine withdrawal symptoms 11/17/17   Lindell Spar I, NP    Physical Exam: Vitals:   10/09/20 2130 10/09/20 2145 10/09/20 2338 10/09/20 2345  BP: 107/64 (!) 102/59  (!) 142/84  Pulse: (!) 111 (!) 108  (!) 101  Resp:  16  18  Temp:   100.2 F (37.9 C)   TempSrc:   Rectal   SpO2: (!) 86% (!) 88%  99%    Constitutional: No apparent respiratory distress, no diaphoresis  Eyes: PERTLA, lids and conjunctivae normal ENMT: Mucous membranes are moist. Posterior pharynx clear of any exudate or lesions.   Neck:  supple, no masses  Respiratory: no wheezing, no crackles. No accessory muscle use.  Cardiovascular: Rate ~110 and regular. No extremity edema.  Abdomen: No distension, no tenderness, soft. Bowel sounds active.  Musculoskeletal: no clubbing / cyanosis. No joint deformity upper and lower extremities.   Skin: no significant rashes, lesions, ulcers. Warm, dry, well-perfused. Neurologic: CN 2-12 grossly intact. Sensation intact. Moving all extremities.   Psychiatric: Appears to be sleeping, wakes to voice. Frequent episodes of agitation with yelling before falling asleep again. Unable to recognize daughter at bedside. Attending to internal stimuli.    Labs and Imaging on Admission: I have personally reviewed following labs and imaging studies  CBC: Recent Labs  Lab 10/09/20 1830  WBC 8.4  NEUTROABS 5.8  HGB 10.4*  HCT 35.3*  MCV 70.9*  PLT 536   Basic Metabolic Panel: Recent Labs  Lab 10/09/20  1830  NA 139  K 4.2  CL 104  CO2 27  GLUCOSE 145*  BUN 8  CREATININE 0.66  CALCIUM 9.4   GFR: CrCl cannot be calculated (Unknown ideal weight.). Liver Function Tests: Recent Labs  Lab 10/09/20 1830  AST 24  ALT 16  ALKPHOS 73  BILITOT 0.7  PROT 8.0  ALBUMIN 4.1   No results for input(s): LIPASE, AMYLASE in the last 168  hours. No results for input(s): AMMONIA in the last 168 hours. Coagulation Profile: No results for input(s): INR, PROTIME in the last 168 hours. Cardiac Enzymes: No results for input(s): CKTOTAL, CKMB, CKMBINDEX, TROPONINI in the last 168 hours. BNP (last 3 results) No results for input(s): PROBNP in the last 8760 hours. HbA1C: No results for input(s): HGBA1C in the last 72 hours. CBG: No results for input(s): GLUCAP in the last 168 hours. Lipid Profile: No results for input(s): CHOL, HDL, LDLCALC, TRIG, CHOLHDL, LDLDIRECT in the last 72 hours. Thyroid Function Tests: No results for input(s): TSH, T4TOTAL, FREET4, T3FREE, THYROIDAB in the last 72 hours. Anemia Panel: No results for input(s): VITAMINB12, FOLATE, FERRITIN, TIBC, IRON, RETICCTPCT in the last 72 hours. Urine analysis:    Component Value Date/Time   COLORURINE STRAW (A) 10/09/2020 2030   APPEARANCEUR CLEAR 10/09/2020 2030   LABSPEC 1.004 (L) 10/09/2020 2030   PHURINE 6.0 10/09/2020 2030   GLUCOSEU NEGATIVE 10/09/2020 2030   [place] NEGATIVE 10/09/2020 2030   [place] NEGATIVE 10/09/2020 2030   [place] 10/09/2020 2030   PROTEINUR NEGATIVE 10/09/2020 2030   UROBILINOGEN 0.2 09/04/2013 0426   NITRITE NEGATIVE 10/09/2020 2030   LEUKOCYTESUR NEGATIVE 10/09/2020 2030   Sepsis Labs: _0 (procalcitonin:4,lacticidven:4) ) Recent Results (from the past 240 hour(s))  Resp Panel by RT-PCR (Flu A&B, Covid) Urine, Clean Catch     Status: None   Collection Time: 10/09/20  9:54 PM   Specimen: Urine, Clean Catch; Nasopharyngeal(NP) swabs in vial transport medium  Result Value Ref Range Status   SARS Coronavirus 2 by RT PCR NEGATIVE NEGATIVE Final    Comment: (NOTE) SARS-CoV-2 target nucleic acids are NOT DETECTED.  The SARS-CoV-2 RNA is generally detectable in upper respiratory specimens during the acute phase of infection. The lowest concentration of SARS-CoV-2 viral copies this assay can detect is 138  copies/mL. A negative result does not preclude SARS-Cov-2 infection and should not be used as the sole basis for treatment or other patient management decisions. A negative result may occur with  improper specimen collection/handling, submission of specimen other than nasopharyngeal swab, presence of viral mutation(s) within the areas targeted by this assay, and inadequate number of viral copies(<138 copies/mL). A negative result must be combined with clinical observations, patient history, and epidemiological information. The expected result is Negative.  Fact Sheet for Patients:  EntrepreneurPulse.com.au  Fact Sheet for Healthcare Providers:  IncredibleEmployment.be  This test is no t yet approved or cleared by the [place] FDA and  has been authorized for detection and/or diagnosis of SARS-CoV-2 by FDA under an Emergency Use Authorization (EUA). This EUA will remain  in effect (meaning this test can be used) for the duration of the COVID-19 declaration under Section 564(b)(1) of the Act, 21 U.S.C.section 360bbb-3(b)(1), unless the authorization is terminated  or revoked sooner.       Influenza A by PCR NEGATIVE NEGATIVE Final   Influenza B by PCR NEGATIVE NEGATIVE Final    Comment: (NOTE) The Xpert Xpress SARS-CoV-2/FLU/RSV plus assay is intended as an aid in the diagnosis  of influenza from Nasopharyngeal swab specimens and should not be used as a sole basis for treatment. Nasal washings and aspirates are unacceptable for Xpert Xpress SARS-CoV-2/FLU/RSV testing.  Fact Sheet for Patients: EntrepreneurPulse.com.au  Fact Sheet for Healthcare Providers: IncredibleEmployment.be  This test is not yet approved or cleared by the [place] FDA and has been authorized for detection and/or diagnosis of SARS-CoV-2 by FDA under an Emergency Use Authorization (EUA). This EUA will remain in effect (meaning  this test can be used) for the duration of the COVID-19 declaration under Section 564(b)(1) of the Act, 21 U.S.C. section 360bbb-3(b)(1), unless the authorization is terminated or revoked.  Performed at [facility], [place] [address] [place], [place] [address]      Radiological Exams on Admission: CT Head Wo Contrast  Result Date: 10/09/2020 CLINICAL DATA:  Delirium, utilized crack cocaine EXAM: CT HEAD WITHOUT CONTRAST TECHNIQUE: Contiguous axial images were obtained from the base of the skull through the vertex without intravenous contrast. COMPARISON:  None. FINDINGS: Brain: No evidence of acute infarction, hemorrhage, hydrocephalus, extra-axial collection, visible mass lesion or mass effect. Vascular: Atherosclerotic calcification of the carotid siphons. No hyperdense vessel. Skull: No calvarial fracture or suspicious osseous lesion. No scalp swelling or hematoma. Sinuses/Orbits: Paranasal sinuses are predominantly clear. Trace left mastoid effusion. Middle ear cavities are clear. Included orbital structures are unremarkable. Other: None IMPRESSION: 1. No acute intracranial abnormality. 2. Trace left mastoid effusion. Electronically Signed   By: Lovena Le M.D.   On: 10/09/2020 23:05   DG Chest Portable 1 View  Result Date: 10/09/2020 CLINICAL DATA:  cough, combative, held for x-ray EXAM: PORTABLE CHEST 1 VIEW COMPARISON:  07/24/2020 FINDINGS: No consolidation, features of edema, pneumothorax, or effusion. Pulmonary vascularity is normally distributed. The cardiomediastinal contours are unremarkable. No acute osseous or soft tissue abnormality. IMPRESSION: No acute cardiopulmonary abnormality. Electronically Signed   By: Lovena Le M.D.   On: 10/09/2020 22:23    EKG: Independently reviewed. Sinus tachycardia, rate 117, QTc 418 ms.   Assessment/Plan   1. Acute encephalopathy  - Presents with one day of confusion and agitation, has cocaine on UDS but unclear when last  used and condition not improving after several hours in ED, no acute findings on head CT, basic blood work stable, no fever or leukocytosis  - DDx broad, includes ingestion, withdrawal, psychosis, nutritional deficiency  - Check ammonia, TSH, B12, thiamine, ethanol, and RPR  - Continue supportive care, delirium precautions, as-needed Haldol    2. Substance abuse  - UDS positive for cocaine; abuses alcohol on occasion per family but no hx of alcohol withdrawal  - Counsel once she able to engage in conversation    3. Type II DM  - A1c was 7.6% in February  - Continue CBG checks and SSI   4. COPD  - Appears stable  - Continue LAMA, ICS/LABA, as-needed albuterol   5. Schizoaffective disorder, bipolar type  - Managed with long-acting Abilify injection and as-needed Xanax     DVT prophylaxis: Lovenox Code Status: Full  Level of Care: Level of care: Med-Surg Family Communication: Daughter updated at bedside  Disposition Plan:  Patient is from: Home Anticipated d/c is to: TBD Anticipated d/c date is: Possibly as early as 10/10/20 Patient currently: Pending additional laboratory workup, clinical improvement   Consults called: None  Admission status: Observation    Vianne Bulls, MD [facility]  10/10/2020, 1:25 AM

## 2020-10-10 NOTE — ED Notes (Signed)
ED TO INPATIENT HANDOFF REPORT  Name/Age/Gender [name] 55 y.o. female  Code Status Code Status History    Date Active Date Inactive Code Status Order ID Comments User Context   07/24/2020 1353 07/27/2020 1641 Full Code [id number]  Lequita Halt, MD ED   08/03/2018 2350 08/06/2018 1540 Full Code [id number]  Laverle Hobby, PA-C Inpatient   08/03/2018 1619 08/03/2018 2316 Full Code [id number]  Deno Etienne, DO ED   11/13/2017 0011 11/16/2017 1716 Full Code [id number]  Rozetta Nunnery, NP Inpatient   11/12/2017 1552 11/12/2017 2331 Full Code [id number]  Long, Wonda Olds, MD ED   04/18/2016 0752 04/20/2016 0007 Full Code [id number]  Kinsinger, Arta Bruce, MD ED   04/17/2016 1039 04/18/2016 0317 Full Code [id number]  Excell Seltzer, MD HOV   03/25/2016 1223 03/27/2016 2004 Full Code [id number]  Excell Seltzer, MD Inpatient   09/28/2011 1236 10/02/2011 1828 Full Code [id number]  Brand Males, MD ED   03/04/2011 1242 03/05/2011 1941 Full Code [id number]  Bonney Aid, RN Inpatient   Advance Care Planning Activity    Questions for Most Recent Historical Code Status (Order [id number])       Home/SNF/Other home  Chief Complaint Acute encephalopathy [G93.40]  Level of Care/Admitting Diagnosis ED Disposition    ED Disposition Condition [place]: [facility] [[id number]]  Level of Care: Med-Surg [16]  Covid Evaluation: Confirmed COVID Negative  Diagnosis: Acute encephalopathy [540086]  Admitting Physician: Vianne Bulls [[id number]]  Attending Physician: Vianne Bulls [[id number]]       Medical History Past Medical History:  Diagnosis Date  . Abdominal pain   . Accidental drug overdose April 2013  . Anxiety   . Atrial fibrillation ([place]) 09/29/11   converted spontaneously  . Chronic back pain   . Chronic knee pain   . Chronic nausea   . Chronic pain   . COPD (chronic obstructive pulmonary disease) ([place])   . Depression   . Diabetes mellitus     states her doctor took her off all DM meds in past month  . Diabetic neuropathy ([place])   . Dyspnea    with exertion   . GERD (gastroesophageal reflux disease)   . Headache(784.0)    migraines   . HTN (hypertension)    not on meds since in a year   . Hyperlipidemia   . Hypothyroidism    not on meds in a while   . Mental disorder    Bipolar and schizophrenic  . Requires supplemental oxygen    as needed per patient   . Schizophrenia ([place])   . Schizophrenia, acute ([place]) 11/13/2017  . Tobacco abuse     Allergies Allergies  Allergen Reactions  . Morphine And Related Nausea And Vomiting and Other (See Comments)    Pt stated she is not allergic to medication  . Aspirin Nausea And Vomiting and Other (See Comments)    Ok to take tylenol or ibuprofen    IV Location/Drains/Wounds Patient Lines/Drains/Airways Status    Active Line/Drains/Airways    Name Placement date Placement time Site Days   Peripheral IV 10/10/20 Left;Posterior Forearm 10/10/20  0050  Forearm  less than 1          Labs/Imaging Results for orders placed or performed during the hospital encounter of 10/09/20 (from the past 48 hour(s))  CBC with Differential     Status: Abnormal   Collection Time: 10/09/20  6:30 PM  Result Value  Ref Range   WBC 8.4 4.0 - 10.5 K/uL   RBC 4.98 3.87 - 5.11 MIL/uL   Hemoglobin 10.4 (L) 12.0 - 15.0 g/dL   HCT 35.3 (L) 36.0 - 46.0 %   MCV 70.9 (L) 80.0 - 100.0 fL   MCH 20.9 (L) 26.0 - 34.0 pg   MCHC 29.5 (L) 30.0 - 36.0 g/dL   RDW 17.5 (H) 11.5 - 15.5 %   Platelets 338 150 - 400 K/uL   nRBC 0.0 0.0 - 0.2 %   Neutrophils Relative % 68 %   Neutro Abs 5.8 1.7 - 7.7 K/uL   Lymphocytes Relative 23 %   Lymphs Abs 1.9 0.7 - 4.0 K/uL   Monocytes Relative 7 %   Monocytes Absolute 0.6 0.1 - 1.0 K/uL   Eosinophils Relative 1 %   Eosinophils Absolute 0.0 0.0 - 0.5 K/uL   Basophils Relative 1 %   Basophils Absolute 0.0 0.0 - 0.1 K/uL   Immature Granulocytes 0 %   Abs Immature  Granulocytes 0.03 0.00 - 0.07 K/uL    Comment: Performed at [facility], [place] [address], [place], [place] [address]  Comprehensive metabolic panel     Status: Abnormal   Collection Time: 10/09/20  6:30 PM  Result Value Ref Range   Sodium 139 135 - 145 mmol/L   Potassium 4.2 3.5 - 5.1 mmol/L   Chloride 104 98 - 111 mmol/L   CO2 27 22 - 32 mmol/L   Glucose, Bld 145 (H) 70 - 99 mg/dL    Comment: Glucose reference range applies only to samples taken after fasting for at least 8 hours.   BUN 8 6 - 20 mg/dL   Creatinine, Ser 0.66 0.44 - 1.00 mg/dL   Calcium 9.4 8.9 - 10.3 mg/dL   Total Protein 8.0 6.5 - 8.1 g/dL   Albumin 4.1 3.5 - 5.0 g/dL   AST 24 15 - 41 U/L   ALT 16 0 - 44 U/L   Alkaline Phosphatase 73 38 - 126 U/L   Total Bilirubin 0.7 0.3 - 1.2 mg/dL   GFR, Estimated >60 >60 mL/min    Comment: (NOTE) Calculated using the CKD-EPI Creatinine Equation (2021)    Anion gap 8 5 - 15    Comment: Performed at [facility], [place] [address], [place], [place] [address]  Acetaminophen level     Status: Abnormal   Collection Time: 10/09/20  6:30 PM  Result Value Ref Range   Acetaminophen (Tylenol), Serum <10 (L) 10 - 30 ug/mL    Comment: (NOTE) Therapeutic concentrations vary significantly. A range of 10-30 ug/mL  may be an effective concentration for many patients. However, some  are best treated at concentrations outside of this range. Acetaminophen concentrations >150 ug/mL at 4 hours after ingestion  and >50 ug/mL at 12 hours after ingestion are often associated with  toxic reactions.  Performed at [facility], [place] [address], [place], [place] [address]   Salicylate level     Status: Abnormal   Collection Time: 10/09/20  6:30 PM  Result Value Ref Range   Salicylate Lvl <5.0 (L) 7.0 - 30.0 mg/dL    Comment: Performed at [facility], [place] [address], [place], [place] [address]  Urinalysis, Routine w reflex  microscopic Urine, Clean Catch     Status: Abnormal   Collection Time: 10/09/20  8:30 PM  Result Value Ref Range   Color, Urine STRAW (A) YELLOW   APPearance CLEAR  CLEAR   Specific Gravity, Urine 1.004 (L) 1.005 - 1.030   pH 6.0 5.0 - 8.0   Glucose, UA NEGATIVE NEGATIVE mg/dL   Hgb urine dipstick NEGATIVE NEGATIVE   Bilirubin Urine NEGATIVE NEGATIVE   Ketones, ur NEGATIVE NEGATIVE mg/dL   Protein, ur NEGATIVE NEGATIVE mg/dL   Nitrite NEGATIVE NEGATIVE   Leukocytes,Ua NEGATIVE NEGATIVE    Comment: Performed at [facility] [address], [place], [place] [address]  Rapid urine drug screen (hospital performed)     Status: Abnormal   Collection Time: 10/09/20  8:30 PM  Result Value Ref Range   Opiates NONE DETECTED NONE DETECTED   Cocaine POSITIVE (A) NONE DETECTED   Benzodiazepines NONE DETECTED NONE DETECTED   Amphetamines NONE DETECTED NONE DETECTED   Tetrahydrocannabinol NONE DETECTED NONE DETECTED   Barbiturates NONE DETECTED NONE DETECTED    Comment: (NOTE) DRUG SCREEN FOR MEDICAL PURPOSES ONLY.  IF CONFIRMATION IS NEEDED FOR ANY PURPOSE, NOTIFY LAB WITHIN 5 DAYS.  LOWEST DETECTABLE LIMITS FOR URINE DRUG SCREEN Drug Class                     Cutoff (ng/mL) Amphetamine and metabolites    1000 Barbiturate and metabolites    200 Benzodiazepine                 381 Tricyclics and metabolites     300 Opiates and metabolites        300 Cocaine and metabolites        300 THC                            50 Performed at [facility], [place] [address], [place], [place] [address]   Resp Panel by RT-PCR (Flu A&B, Covid) Urine, Clean Catch     Status: None   Collection Time: 10/09/20  9:54 PM   Specimen: Urine, Clean Catch; Nasopharyngeal(NP) swabs in vial transport medium  Result Value Ref Range   SARS Coronavirus 2 by RT PCR NEGATIVE NEGATIVE    Comment: (NOTE) SARS-CoV-2 target nucleic acids are NOT DETECTED.  The SARS-CoV-2 RNA is  generally detectable in upper respiratory specimens during the acute phase of infection. The lowest concentration of SARS-CoV-2 viral copies this assay can detect is 138 copies/mL. A negative result does not preclude SARS-Cov-2 infection and should not be used as the sole basis for treatment or other patient management decisions. A negative result may occur with  improper specimen collection/handling, submission of specimen other than nasopharyngeal swab, presence of viral mutation(s) within the areas targeted by this assay, and inadequate number of viral copies(<138 copies/mL). A negative result must be combined with clinical observations, patient history, and epidemiological information. The expected result is Negative.  Fact Sheet for Patients:  EntrepreneurPulse.com.au  Fact Sheet for Healthcare Providers:  IncredibleEmployment.be  This test is no t yet approved or cleared by the [place] FDA and  has been authorized for detection and/or diagnosis of SARS-CoV-2 by FDA under an Emergency Use Authorization (EUA). This EUA will remain  in effect (meaning this test can be used) for the duration of the COVID-19 declaration under Section 564(b)(1) of the Act, 21 U.S.C.section 360bbb-3(b)(1), unless the authorization is terminated  or revoked sooner.       Influenza A by PCR NEGATIVE NEGATIVE   Influenza B by PCR NEGATIVE NEGATIVE    Comment: (NOTE) The Xpert Xpress SARS-CoV-2/FLU/RSV plus assay  is intended as an aid in the diagnosis of influenza from Nasopharyngeal swab specimens and should not be used as a sole basis for treatment. Nasal washings and aspirates are unacceptable for Xpert Xpress SARS-CoV-2/FLU/RSV testing.  Fact Sheet for Patients: EntrepreneurPulse.com.au  Fact Sheet for Healthcare Providers: IncredibleEmployment.be  This test is not yet approved or cleared by the [place] FDA  and has been authorized for detection and/or diagnosis of SARS-CoV-2 by FDA under an Emergency Use Authorization (EUA). This EUA will remain in effect (meaning this test can be used) for the duration of the COVID-19 declaration under Section 564(b)(1) of the Act, 21 U.S.C. section 360bbb-3(b)(1), unless the authorization is terminated or revoked.  Performed at [facility], [place] [address], [place], [place] [address]    CT Head Wo Contrast  Result Date: 10/09/2020 CLINICAL DATA:  Delirium, utilized crack cocaine EXAM: CT HEAD WITHOUT CONTRAST TECHNIQUE: Contiguous axial images were obtained from the base of the skull through the vertex without intravenous contrast. COMPARISON:  None. FINDINGS: Brain: No evidence of acute infarction, hemorrhage, hydrocephalus, extra-axial collection, visible mass lesion or mass effect. Vascular: Atherosclerotic calcification of the carotid siphons. No hyperdense vessel. Skull: No calvarial fracture or suspicious osseous lesion. No scalp swelling or hematoma. Sinuses/Orbits: Paranasal sinuses are predominantly clear. Trace left mastoid effusion. Middle ear cavities are clear. Included orbital structures are unremarkable. Other: None IMPRESSION: 1. No acute intracranial abnormality. 2. Trace left mastoid effusion. Electronically Signed   By: Lovena Le M.D.   On: 10/09/2020 23:05   DG Chest Portable 1 View  Result Date: 10/09/2020 CLINICAL DATA:  cough, combative, held for x-ray EXAM: PORTABLE CHEST 1 VIEW COMPARISON:  07/24/2020 FINDINGS: No consolidation, features of edema, pneumothorax, or effusion. Pulmonary vascularity is normally distributed. The cardiomediastinal contours are unremarkable. No acute osseous or soft tissue abnormality. IMPRESSION: No acute cardiopulmonary abnormality. Electronically Signed   By: Lovena Le M.D.   On: 10/09/2020 22:23    Pending Labs Unresulted Labs (From admission, onward)          Start     Ordered    10/10/20 0041  Ethanol  Once,   STAT        10/10/20 0040   10/10/20 0041  TSH  Once,   STAT        10/10/20 0040   10/10/20 0041  Vitamin B12  Once,   STAT        10/10/20 0040   10/10/20 0041  Vitamin B1  Once,   STAT        10/10/20 0040   10/10/20 0041  RPR  Once,   STAT        10/10/20 0040   10/10/20 0041  HIV Antibody (routine testing w rflx)  (HIV Antibody (Routine testing w reflex) panel)  Once,   STAT        10/10/20 0040          Vitals/Pain Today's Vitals   10/09/20 2130 10/09/20 2145 10/09/20 2338 10/09/20 2345  BP: 107/64 (!) 102/59  (!) 142/84  Pulse: (!) 111 (!) 108  (!) 101  Resp:  16  18  Temp:   100.2 F (37.9 C)   TempSrc:   Rectal   SpO2: (!) 86% (!) 88%  99%  PainSc:        Isolation Precautions Airborne and Contact precautions  Medications Medications  sodium chloride 0.9 % bolus 1,000 mL (0 mLs Intravenous Hold 10/09/20 2353)  haloperidol lactate (HALDOL)  injection 3 mg (has no administration in time range)  LORazepam (ATIVAN) injection 0.5 mg (0.5 mg Intramuscular Given 10/09/20 2050)    Mobility Normally ambulatory; currently not

## 2020-10-10 NOTE — ED Notes (Signed)
Posey belt remains in place pt continues to try to get out of bed and hit staff.

## 2020-10-10 NOTE — Consult Note (Signed)
  Patient just received Haldol and Ativan secondary to agitation and as a result, too sedated to participate in full psychiatric interview. This was communicated with her primary provider, who suggested I attempt to wake patient. Upon awakening she immediately began pulling at her restraints and begged to have them removed. She then fell asleep mid sentence, writer attempted to awake her again in which she aroused but fell back to sleep within seconds.   However her chart reviewed indicates multiple hospitalizations for Bipolar disorder and Cocaine Use Disorder. Her last two previous ER visits, her urine was positive for cocaine. She is a 55 year old female who was admitted for acute encephalopathy, she remained in the ED for several hours with no improvement in her mentation in which she was admitted to the floor. During this morning she became agitated and attempted to leave and was subsequently placed in IVC.   Collateral obtained from daughter [name]) her subtance use and noncompliant/adherence with medications. She is interested in inpatient substance abuse facility.      Recommendations discussed with Hospitalist: -Chief Technology Officer as patient is in restraints, and currently under IVC.  -WIll start Risperdal 1mg  po BID at this time to target substance induced psychosis. Hoping she will clear up within a few days after initiation of anti-psychotics however she may require further neurological workup.  -Recommend TOC consult for referral to inpatient substance abuse facilities.  -Will add on urine drug screen send out to check for additional substances, as daughter is highly concerned about this state of psychosis in comparison to previous times when her mother has used cocaine.  -Patient with positive RPR, titer pending. Will recommend adding on std panel at this time to include HIV, GC/CT/Trich. Contact was initiated with communicable disease dept at [facility] however unsuccessful.   -Psychiatric service will re-evaluate patient tomorrow.

## 2020-10-10 NOTE — Progress Notes (Signed)
Patient refused vital signs and lab draw, stating "Get out of my room. I'm calling the police".

## 2020-10-10 NOTE — Progress Notes (Signed)
Ordered Haldol has restricted use. Paged B. Kyere to request to administer. Patient threw bedpan and trying to hit staff.

## 2020-10-10 NOTE — Progress Notes (Signed)
PROGRESS NOTE    [name]  XQJ:[id number] DOB: February 27, 1966 DOA: 10/09/2020 PCP: [name], MD   Brief Narrative:  [name] is a 55 y.o. female with medical history significant for polysubstance abuse, schizoaffective disorder, COPD, and insulin-dependent diabetes mellitus, presenting to the emergency department for evaluation of confusion and agitation.  She is accompanied by a daughter who assists with the history.  Patient has been living alone while her husband is staying with his ill parents, seem to be in her usual state yesterday when she spoke with her daughter on the phone, was noted to be a little bit difficult to understand and possibly confused when she spoke with her husband on the phone at approximately 1 PM, and then her daughter received a phone call from the patient's friend later in the afternoon reporting that the patient was not acting like her usual self.  EMS was called and the patient's daughter arrived on the scene at the same time as EMS.  Patient had her pants around her ankles and was having trouble pulling them up, did not recognize her daughter, was agitated, and could not be redirected.  Patient had been with her friend who she is known to have abused illicit substances with in the past but he was unwilling to provide much information today.  Patient had not been complaining of anything last time she had spoken with family.  At her baseline, she is alert, fully oriented, able to care for herself, drives, and pays her own bills.  ED Course: Upon arrival to the ED, patient is found to be afebrile, slightly tachycardic, saturating well on room air, and with stable blood pressure.  EKG features sinus tachycardia with rate 117 and QTc interval 418 ms.  Chest x-ray negative for acute cardiopulmonary disease.  Head CT negative for acute intracranial abnormality.  CMP unremarkable.  CBC with stable microcytic anemia.  UDS positive for cocaine only.  Salicylate and  acetaminophen levels undetectable.  Urinalysis notable for low specific gravity.  COVID-19 screening test was negative.  The patient was given a liter of saline and IM Ativan in the ED.  Assessment & Plan:   Principal Problem:   Acute encephalopathy Active Problems:   COPD (chronic obstructive pulmonary disease) (HCC)   Type 2 diabetes mellitus with diabetic polyneuropathy, with long-term current use of insulin (HCC)   Schizoaffective disorder, bipolar type (HCC)   Polysubstance (excluding opioids) dependence, daily use ([place])   #1 acute encephalopathy of unclear etiology in the setting of schizoaffective disorder/bipolar disorder.  At her baseline patient is able to care for herself she drives she does all her ADLs herself and she pays her bills.  Discussed with daughter.  Patient was out with a friend of hers yesterday and the friend is not willing to talk to her about what has happened.  This friend is also has a history of illicit substance abuse.  Urine drug screen positive only for cocaine.  Work-up does not show any signs of infection.  White count normal chest x-ray negative head CT shows no acute findings, UA negative COVID-19 negative. She was violent combative and agitated this morning and overnight she took off her restraints and ran out of the room when security was called.  She was yelling and screaming. She turned around backwards stood up in bed and pulled the Posey restraint off and threatening the staff she got dressed and ran down the hall per overnight staff.  She received Haldol which did  not helped her.  Then she was given a milligram of Ativan after which she calmed down and went to sleep Consulted psych Discussed with SW IVCd patient says she is a harm to herself and others. She is tachycardic and hypertensive now with a fever of 100.2. B12 is 409.  TSH is 2.7.  Alcohol level less than 10.  Urine drug screen positive for cocaine.  Acetaminophen and salicylates level less  than 10 and less than 7 respectively. I will start her on some fluids if she is able to tolerate, since she has not been eating or drinking. I have also started her on a clonidine detox protocol.  #2 type 2 diabetes on metformin and NovoLog sliding scale at home.  #3 history of COPD continue to use inhaler/nebs as needed.  She is on Symbicort and Spiriva.  #4 polysubstance abuse psych consult   #5 schizo affective disorder-she is on Abilify 400 mg every 28 days.  She is also on Xanax 1 mg twice a day at home.  Continue Neurontin 300 mg 3 times a day.  #6 tobacco abuse we will start nicotine patch.   Estimated body mass index is 18.84 kg/m as calculated from the following:   Height as of 07/25/20: 5\' 2"  (1.575 m).   Weight as of 07/27/20: 46.7 kg.  DVT prophylaxis: Lovenox  code Status: Full code  family Communication: Discussed with daughter  disposition Plan:  Status is: Observation   Dispo: The patient is from: Home              Anticipated d/c is to: Home              Patient currently is not medically stable to d/c.   Difficult to place patient No   Consultants:   psych  Procedures: Antimicrobials:  Subjective: Patient very agitated violent combative took off her restraints and ran of the room when I saw her she was in the room yelling and screaming with the security guards in the room. She is hallucinating.  Objective: Vitals:   10/09/20 2338 10/09/20 2345 10/10/20 0843 10/10/20 0844  BP:  (!) 142/84    Pulse:  (!) 101    Resp:  18    Temp: 100.2 F (37.9 C)     TempSrc: Rectal     SpO2:  99% 95% 95%    Intake/Output Summary (Last 24 hours) at 10/10/2020 2423 Last data filed at 10/10/2020 0400 Gross per 24 hour  Intake 0 ml  Output --  Net 0 ml   There were no vitals filed for this visit.  Examination:  General exam: Appears agitated anxious standing up in the room pacing in the room.  Security in the room.  I was unable to examine her. Psychiatry:  Judgement and insight appear impaired mood and affect impaired.   Data Reviewed: I have personally reviewed following labs and imaging studies  CBC: Recent Labs  Lab 10/09/20 1830 10/10/20 0135  WBC 8.4 9.2  NEUTROABS 5.8  --   HGB 10.4* 9.8*  HCT 35.3* 33.2*  MCV 70.9* 71.9*  PLT 338 536   Basic Metabolic Panel: Recent Labs  Lab 10/09/20 1830 10/10/20 0135  NA 139 142  K 4.2 4.2  CL 104 107  CO2 27 25  GLUCOSE 145* 143*  BUN 8 8  CREATININE 0.66 0.60  CALCIUM 9.4 9.0   GFR: CrCl cannot be calculated (Unknown ideal weight.). Liver Function Tests: Recent Labs  Lab 10/09/20 1830  AST 24  ALT 16  ALKPHOS 73  BILITOT 0.7  PROT 8.0  ALBUMIN 4.1   No results for input(s): LIPASE, AMYLASE in the last 168 hours. Recent Labs  Lab 10/10/20 0759  AMMONIA 18   Coagulation Profile: No results for input(s): INR, PROTIME in the last 168 hours. Cardiac Enzymes: No results for input(s): CKTOTAL, CKMB, CKMBINDEX, TROPONINI in the last 168 hours. BNP (last 3 results) No results for input(s): PROBNP in the last 8760 hours. HbA1C: No results for input(s): HGBA1C in the last 72 hours. CBG: Recent Labs  Lab 10/10/20 0820  GLUCAP 130*   Lipid Profile: No results for input(s): CHOL, HDL, LDLCALC, TRIG, CHOLHDL, LDLDIRECT in the last 72 hours. Thyroid Function Tests: Recent Labs    10/10/20 0135  TSH 2.720   Anemia Panel: Recent Labs    10/10/20 0135  VITAMINB12 409   Sepsis Labs: No results for input(s): PROCALCITON, LATICACIDVEN in the last 168 hours.  Recent Results (from the past 240 hour(s))  Resp Panel by RT-PCR (Flu A&B, Covid) Urine, Clean Catch     Status: None   Collection Time: 10/09/20  9:54 PM   Specimen: Urine, Clean Catch; Nasopharyngeal(NP) swabs in vial transport medium  Result Value Ref Range Status   SARS Coronavirus 2 by RT PCR NEGATIVE NEGATIVE Final    Comment: (NOTE) SARS-CoV-2 target nucleic acids are NOT DETECTED.  The SARS-CoV-2  RNA is generally detectable in upper respiratory specimens during the acute phase of infection. The lowest concentration of SARS-CoV-2 viral copies this assay can detect is 138 copies/mL. A negative result does not preclude SARS-Cov-2 infection and should not be used as the sole basis for treatment or other patient management decisions. A negative result may occur with  improper specimen collection/handling, submission of specimen other than nasopharyngeal swab, presence of viral mutation(s) within the areas targeted by this assay, and inadequate number of viral copies(<138 copies/mL). A negative result must be combined with clinical observations, patient history, and epidemiological information. The expected result is Negative.  Fact Sheet for Patients:  EntrepreneurPulse.com.au  Fact Sheet for Healthcare Providers:  IncredibleEmployment.be  This test is no t yet approved or cleared by the [place] FDA and  has been authorized for detection and/or diagnosis of SARS-CoV-2 by FDA under an Emergency Use Authorization (EUA). This EUA will remain  in effect (meaning this test can be used) for the duration of the COVID-19 declaration under Section 564(b)(1) of the Act, 21 U.S.C.section 360bbb-3(b)(1), unless the authorization is terminated  or revoked sooner.       Influenza A by PCR NEGATIVE NEGATIVE Final   Influenza B by PCR NEGATIVE NEGATIVE Final    Comment: (NOTE) The Xpert Xpress SARS-CoV-2/FLU/RSV plus assay is intended as an aid in the diagnosis of influenza from Nasopharyngeal swab specimens and should not be used as a sole basis for treatment. Nasal washings and aspirates are unacceptable for Xpert Xpress SARS-CoV-2/FLU/RSV testing.  Fact Sheet for Patients: EntrepreneurPulse.com.au  Fact Sheet for Healthcare Providers: IncredibleEmployment.be  This test is not yet approved or cleared by the  [place] FDA and has been authorized for detection and/or diagnosis of SARS-CoV-2 by FDA under an Emergency Use Authorization (EUA). This EUA will remain in effect (meaning this test can be used) for the duration of the COVID-19 declaration under Section 564(b)(1) of the Act, 21 U.S.C. section 360bbb-3(b)(1), unless the authorization is terminated or revoked.  Performed at [facility], [address] [place], [place]  [address]          Radiology Studies: CT Head Wo Contrast  Result Date: 10/09/2020 CLINICAL DATA:  Delirium, utilized crack cocaine EXAM: CT HEAD WITHOUT CONTRAST TECHNIQUE: Contiguous axial images were obtained from the base of the skull through the vertex without intravenous contrast. COMPARISON:  None. FINDINGS: Brain: No evidence of acute infarction, hemorrhage, hydrocephalus, extra-axial collection, visible mass lesion or mass effect. Vascular: Atherosclerotic calcification of the carotid siphons. No hyperdense vessel. Skull: No calvarial fracture or suspicious osseous lesion. No scalp swelling or hematoma. Sinuses/Orbits: Paranasal sinuses are predominantly clear. Trace left mastoid effusion. Middle ear cavities are clear. Included orbital structures are unremarkable. Other: None IMPRESSION: 1. No acute intracranial abnormality. 2. Trace left mastoid effusion. Electronically Signed   By: Lovena Le M.D.   On: 10/09/2020 23:05   DG Chest Portable 1 View  Result Date: 10/09/2020 CLINICAL DATA:  cough, combative, held for x-ray EXAM: PORTABLE CHEST 1 VIEW COMPARISON:  07/24/2020 FINDINGS: No consolidation, features of edema, pneumothorax, or effusion. Pulmonary vascularity is normally distributed. The cardiomediastinal contours are unremarkable. No acute osseous or soft tissue abnormality. IMPRESSION: No acute cardiopulmonary abnormality. Electronically Signed   By: Lovena Le M.D.   On: 10/09/2020 22:23        Scheduled Meds: . enoxaparin  (LOVENOX) injection  30 mg Subcutaneous Q24H  . insulin aspart  0-5 Units Subcutaneous QHS  . insulin aspart  0-9 Units Subcutaneous TID WC  . [START ON 10/11/2020] mometasone-formoterol  2 puff Inhalation BID  . umeclidinium bromide  1 puff Inhalation Daily   Continuous Infusions:   LOS: 0 days    Georgette Shell, MD 10/10/2020, 9:58 AM

## 2020-10-10 NOTE — Progress Notes (Signed)
Received call from B. Kyere stating Haldol may be administered.

## 2020-10-10 NOTE — Progress Notes (Signed)
Patient turned around backwards, stood up in bed and pulled posey restraint off, threatened staff. Got dressed and ran down hall. Paged B. Kyere.

## 2020-10-11 DIAGNOSIS — G9340 Encephalopathy, unspecified: Secondary | ICD-10-CM

## 2020-10-11 LAB — AMMONIA: Ammonia: 24 umol/L (ref 9–35)

## 2020-10-11 LAB — T.PALLIDUM AB, TOTAL: T Pallidum Abs: REACTIVE — AB

## 2020-10-11 LAB — RPR
RPR Ser Ql: REACTIVE — AB
RPR Titer: 1:1 {titer}

## 2020-10-11 LAB — GLUCOSE, CAPILLARY
Glucose-Capillary: 106 mg/dL — ABNORMAL HIGH (ref 70–99)
Glucose-Capillary: 201 mg/dL — ABNORMAL HIGH (ref 70–99)
Glucose-Capillary: 199 mg/dL — ABNORMAL HIGH (ref 70–99)

## 2020-10-11 MED ORDER — THIAMINE HCL 100 MG/ML IJ SOLN
300.0000 mg | Freq: Three times a day (TID) | INTRAVENOUS | Status: DC
  Administered 2020-10-11 (×2): 300 mg via INTRAVENOUS
  Filled 2020-10-11 (×3): qty 3

## 2020-10-11 NOTE — Progress Notes (Signed)
Discharged home with daughter via w/c.  Discharge instructions provided and reviewed.  Questions answered.

## 2020-10-11 NOTE — Plan of Care (Signed)
  Problem: Safety: Goal: Non-violent Restraint(s) Outcome: Progressing   Problem: Education: Goal: Knowledge of General Education information will improve Description: Including pain rating scale, medication(s)/side effects and non-pharmacologic comfort measures Outcome: Progressing   Problem: Health Behavior/Discharge Planning: Goal: Ability to manage health-related needs will improve Outcome: Progressing   Problem: Clinical Measurements: Goal: Ability to maintain clinical measurements within normal limits will improve Outcome: Progressing Goal: Will remain free from infection Outcome: Progressing Goal: Diagnostic test results will improve Outcome: Progressing Goal: Respiratory complications will improve Outcome: Progressing Goal: Cardiovascular complication will be avoided Outcome: Progressing   Problem: Activity: Goal: Risk for activity intolerance will decrease Outcome: Progressing   Problem: Nutrition: Goal: Adequate nutrition will be maintained Outcome: Progressing   Problem: Coping: Goal: Level of anxiety will decrease Outcome: Progressing   Problem: Elimination: Goal: Will not experience complications related to bowel motility Outcome: Progressing Goal: Will not experience complications related to urinary retention Outcome: Progressing   Problem: Pain Managment: Goal: General experience of comfort will improve Outcome: Progressing   Problem: Safety: Goal: Ability to remain free from injury will improve Outcome: Progressing   Problem: Skin Integrity: Goal: Risk for impaired skin integrity will decrease Outcome: Progressing   Problem: Safety: Goal: Violent Restraint(s) Outcome: Progressing

## 2020-10-11 NOTE — Progress Notes (Incomplete)
PROGRESS NOTE    [name]  [id number]:[id number] DOB: 11-26-1965 DOA: 10/09/2020 PCP: [name], MD (Confirm with patient/family/NH records and if not entered, this HAS to be entered at [facility] point of entry. "No PCP" if truly none.)   Brief Narrative: (Start on day 1 of progress note - keep it brief and live) ***   Assessment & Plan:   Principal Problem:   Acute encephalopathy Active Problems:   COPD (chronic obstructive pulmonary disease) ([place])   Type 2 diabetes mellitus with diabetic polyneuropathy, with long-term current use of insulin (HCC)   Schizoaffective disorder, bipolar type (HCC)   Polysubstance (excluding opioids) dependence, daily use (HCC)                        Estimated body mass index is 21.42 kg/m as calculated from the following:   Height as of this encounter: 5' 2.4" (1.585 m).   Weight as of this encounter: 53.8 kg.  DVT prophylaxis: (Lovenox/Heparin/SCD's/anticoagulated/None (if comfort care) Code Status: (Full/Partial - specify details) Family Communication: (Specify name, relationship & date discussed. NO "discussed with patient") Disposition Plan:  Status is: Inpatient  {Inpatient:23812}  Dispo: The patient is from: {From:23814}              Anticipated d/c is to: {To:23815}              Patient currently {Medically stable:23817}   Difficult to place patient {Yes/No:25151}       Consultants:   ***  Procedures: (Don't include imaging studies which can be auto populated. Include things that cannot be auto populated i.e. Echo, Carotid and venous dopplers, Foley, Bipap, HD, tubes/drains, wound vac, central lines etc)  ***  Antimicrobials: (specify start and planned stop date. Auto populated tables are space occupying and do not give end dates)  ***    Subjective: ***  Objective: Vitals:   10/11/20 0148 10/11/20 0558 10/11/20 0955 10/11/20 1342  BP: 123/72 122/72 128/71 120/70  Pulse: 96 (!) 105 (!) 106 (!) 114   Resp: 16 16 20 17   Temp:  97.8 F (36.6 C) 98.1 F (36.7 C) 98.3 F (36.8 C)  TempSrc:  Oral Oral Oral  SpO2: 100% 96% 92% 95%  Weight:      Height:        Intake/Output Summary (Last 24 hours) at 10/11/2020 1452 Last data filed at 10/11/2020 1022 Gross per 24 hour  Intake 2759.49 ml  Output 1550 ml  Net 1209.49 ml   Filed Weights   10/10/20 1700  Weight: 53.8 kg    Examination:  General exam: Appears calm and comfortable  Respiratory system: Clear to auscultation. Respiratory effort normal. Cardiovascular system: S1 & S2 heard, RRR. No JVD, murmurs, rubs, gallops or clicks. No pedal edema. Gastrointestinal system: Abdomen is nondistended, soft and nontender. No organomegaly or masses felt. Normal bowel sounds heard. Central nervous system: Alert and oriented. No focal neurological deficits. Extremities: Symmetric 5 x 5 power. Skin: No rashes, lesions or ulcers Psychiatry: Judgement and insight appear normal. Mood & affect appropriate.     Data Reviewed: I have personally reviewed following labs and imaging studies  CBC: Recent Labs  Lab 10/09/20 1830 10/10/20 0135  WBC 8.4 9.2  NEUTROABS 5.8  --   HGB 10.4* 9.8*  HCT 35.3* 33.2*  MCV 70.9* 71.9*  PLT 338 109   Basic Metabolic Panel: Recent Labs  Lab 10/09/20 1830 10/10/20 0135  NA 139 142  K 4.2 4.2  CL 104 107  CO2 27 25  GLUCOSE 145* 143*  BUN 8 8  CREATININE 0.66 0.60  CALCIUM 9.4 9.0   GFR: Estimated Creatinine Clearance: 63.2 mL/min (by C-G formula based on SCr of 0.6 mg/dL). Liver Function Tests: Recent Labs  Lab 10/09/20 1830  AST 24  ALT 16  ALKPHOS 73  BILITOT 0.7  PROT 8.0  ALBUMIN 4.1   No results for input(s): LIPASE, AMYLASE in the last 168 hours. Recent Labs  Lab 10/10/20 0759  AMMONIA 18   Coagulation Profile: No results for input(s): INR, PROTIME in the last 168 hours. Cardiac Enzymes: No results for input(s): CKTOTAL, CKMB, CKMBINDEX, TROPONINI in the last 168  hours. BNP (last 3 results) No results for input(s): PROBNP in the last 8760 hours. HbA1C: No results for input(s): HGBA1C in the last 72 hours. CBG: Recent Labs  Lab 10/10/20 0820 10/10/20 1202 10/10/20 2058 10/11/20 0738 10/11/20 1219  GLUCAP 130* 156* 78 106* 201*   Lipid Profile: No results for input(s): CHOL, HDL, LDLCALC, TRIG, CHOLHDL, LDLDIRECT in the last 72 hours. Thyroid Function Tests: Recent Labs    10/10/20 0135  TSH 2.720   Anemia Panel: Recent Labs    10/10/20 0135  VITAMINB12 409   Sepsis Labs: No results for input(s): PROCALCITON, LATICACIDVEN in the last 168 hours.  Recent Results (from the past 240 hour(s))  Resp Panel by RT-PCR (Flu A&B, Covid) Urine, Clean Catch     Status: None   Collection Time: 10/09/20  9:54 PM   Specimen: Urine, Clean Catch; Nasopharyngeal(NP) swabs in vial transport medium  Result Value Ref Range Status   SARS Coronavirus 2 by RT PCR NEGATIVE NEGATIVE Final    Comment: (NOTE) SARS-CoV-2 target nucleic acids are NOT DETECTED.  The SARS-CoV-2 RNA is generally detectable in upper respiratory specimens during the acute phase of infection. The lowest concentration of SARS-CoV-2 viral copies this assay can detect is 138 copies/mL. A negative result does not preclude SARS-Cov-2 infection and should not be used as the sole basis for treatment or other patient management decisions. A negative result may occur with  improper specimen collection/handling, submission of specimen other than nasopharyngeal swab, presence of viral mutation(s) within the areas targeted by this assay, and inadequate number of viral copies(<138 copies/mL). A negative result must be combined with clinical observations, patient history, and epidemiological information. The expected result is Negative.  Fact Sheet for Patients:  EntrepreneurPulse.com.au  Fact Sheet for Healthcare Providers:   IncredibleEmployment.be  This test is no t yet approved or cleared by the [place] FDA and  has been authorized for detection and/or diagnosis of SARS-CoV-2 by FDA under an Emergency Use Authorization (EUA). This EUA will remain  in effect (meaning this test can be used) for the duration of the COVID-19 declaration under Section 564(b)(1) of the Act, 21 U.S.C.section 360bbb-3(b)(1), unless the authorization is terminated  or revoked sooner.       Influenza A by PCR NEGATIVE NEGATIVE Final   Influenza B by PCR NEGATIVE NEGATIVE Final    Comment: (NOTE) The Xpert Xpress SARS-CoV-2/FLU/RSV plus assay is intended as an aid in the diagnosis of influenza from Nasopharyngeal swab specimens and should not be used as a sole basis for treatment. Nasal washings and aspirates are unacceptable for Xpert Xpress SARS-CoV-2/FLU/RSV testing.  Fact Sheet for Patients: EntrepreneurPulse.com.au  Fact Sheet for Healthcare Providers: IncredibleEmployment.be  This test is not yet approved or cleared by the [place] and has been authorized for  detection and/or diagnosis of SARS-CoV-2 by FDA under an Emergency Use Authorization (EUA). This EUA will remain in effect (meaning this test can be used) for the duration of the COVID-19 declaration under Section 564(b)(1) of the Act, 21 U.S.C. section 360bbb-3(b)(1), unless the authorization is terminated or revoked.  Performed at [facility], [place] [address] [place], [place] [address]          Radiology Studies: CT Head Wo Contrast  Result Date: 10/09/2020 CLINICAL DATA:  Delirium, utilized crack cocaine EXAM: CT HEAD WITHOUT CONTRAST TECHNIQUE: Contiguous axial images were obtained from the base of the skull through the vertex without intravenous contrast. COMPARISON:  None. FINDINGS: Brain: No evidence of acute infarction, hemorrhage, hydrocephalus, extra-axial  collection, visible mass lesion or mass effect. Vascular: Atherosclerotic calcification of the carotid siphons. No hyperdense vessel. Skull: No calvarial fracture or suspicious osseous lesion. No scalp swelling or hematoma. Sinuses/Orbits: Paranasal sinuses are predominantly clear. Trace left mastoid effusion. Middle ear cavities are clear. Included orbital structures are unremarkable. Other: None IMPRESSION: 1. No acute intracranial abnormality. 2. Trace left mastoid effusion. Electronically Signed   By: Lovena Le M.D.   On: 10/09/2020 23:05   DG Chest Portable 1 View  Result Date: 10/09/2020 CLINICAL DATA:  cough, combative, held for x-ray EXAM: PORTABLE CHEST 1 VIEW COMPARISON:  07/24/2020 FINDINGS: No consolidation, features of edema, pneumothorax, or effusion. Pulmonary vascularity is normally distributed. The cardiomediastinal contours are unremarkable. No acute osseous or soft tissue abnormality. IMPRESSION: No acute cardiopulmonary abnormality. Electronically Signed   By: Lovena Le M.D.   On: 10/09/2020 22:23        Scheduled Meds: . enoxaparin (LOVENOX) injection  30 mg Subcutaneous Q24H  . insulin aspart  0-5 Units Subcutaneous QHS  . insulin aspart  0-9 Units Subcutaneous TID WC  . mometasone-formoterol  2 puff Inhalation BID  . nicotine  14 mg Transdermal Daily  . risperiDONE  1 mg Oral BID  . umeclidinium bromide  1 puff Inhalation Daily   Continuous Infusions: . sodium chloride 150 mL/hr at 10/11/20 0354  . thiamine injection 300 mg (10/11/20 1048)     LOS: 1 day    Time spent: ***    Georgette Shell, MD [facility] Pager [phone]  If 7PM-7AM, please contact night-coverage [email or web address] Password [facility] 10/11/2020, 2:52 PM

## 2020-10-11 NOTE — Plan of Care (Signed)
  Problem: Safety: Goal: Non-violent Restraint(s) 10/11/2020 0353 by Josepha Pigg, RN Outcome: Progressing 10/11/2020 0324 by Josepha Pigg, RN Outcome: Progressing   Problem: Education: Goal: Knowledge of General Education information will improve Description: Including pain rating scale, medication(s)/side effects and non-pharmacologic comfort measures 10/11/2020 0353 by Josepha Pigg, RN Outcome: Progressing 10/11/2020 0324 by Josepha Pigg, RN Outcome: Progressing   Problem: Health Behavior/Discharge Planning: Goal: Ability to manage health-related needs will improve 10/11/2020 0353 by Josepha Pigg, RN Outcome: Progressing 10/11/2020 0324 by Josepha Pigg, RN Outcome: Progressing   Problem: Clinical Measurements: Goal: Ability to maintain clinical measurements within normal limits will improve 10/11/2020 0353 by Josepha Pigg, RN Outcome: Progressing 10/11/2020 0324 by Josepha Pigg, RN Outcome: Progressing Goal: Will remain free from infection 10/11/2020 0353 by Josepha Pigg, RN Outcome: Progressing 10/11/2020 0324 by Josepha Pigg, RN Outcome: Progressing Goal: Diagnostic test results will improve 10/11/2020 0353 by Josepha Pigg, RN Outcome: Progressing 10/11/2020 0324 by Josepha Pigg, RN Outcome: Progressing Goal: Respiratory complications will improve 10/11/2020 0353 by Josepha Pigg, RN Outcome: Progressing 10/11/2020 0324 by Josepha Pigg, RN Outcome: Progressing Goal: Cardiovascular complication will be avoided 10/11/2020 0353 by Josepha Pigg, RN Outcome: Progressing 10/11/2020 0324 by Josepha Pigg, RN Outcome: Progressing   Problem: Activity: Goal: Risk for activity intolerance will decrease 10/11/2020 0353 by Josepha Pigg, RN Outcome: Progressing 10/11/2020 0324 by Josepha Pigg, RN Outcome: Progressing   Problem: Nutrition: Goal: Adequate nutrition will be  maintained 10/11/2020 0353 by Josepha Pigg, RN Outcome: Progressing 10/11/2020 0324 by Josepha Pigg, RN Outcome: Progressing   Problem: Coping: Goal: Level of anxiety will decrease 10/11/2020 0353 by Josepha Pigg, RN Outcome: Progressing 10/11/2020 0324 by Josepha Pigg, RN Outcome: Progressing   Problem: Elimination: Goal: Will not experience complications related to bowel motility 10/11/2020 0353 by Josepha Pigg, RN Outcome: Progressing 10/11/2020 0324 by Josepha Pigg, RN Outcome: Progressing Goal: Will not experience complications related to urinary retention 10/11/2020 0353 by Josepha Pigg, RN Outcome: Progressing 10/11/2020 0324 by Josepha Pigg, RN Outcome: Progressing   Problem: Pain Managment: Goal: General experience of comfort will improve 10/11/2020 0353 by Josepha Pigg, RN Outcome: Progressing 10/11/2020 0324 by Josepha Pigg, RN Outcome: Progressing   Problem: Safety: Goal: Ability to remain free from injury will improve 10/11/2020 0353 by Josepha Pigg, RN Outcome: Progressing 10/11/2020 0324 by Josepha Pigg, RN Outcome: Progressing   Problem: Skin Integrity: Goal: Risk for impaired skin integrity will decrease 10/11/2020 0353 by Josepha Pigg, RN Outcome: Progressing 10/11/2020 0324 by Josepha Pigg, RN Outcome: Progressing   Problem: Safety: Goal: Violent Restraint(s) 10/11/2020 0353 by Josepha Pigg, RN Outcome: Progressing 10/11/2020 0324 by Josepha Pigg, RN Outcome: Progressing

## 2020-10-11 NOTE — Progress Notes (Addendum)
Pt with continued agitation and confusion during night shift. Pt aggressive biting and trying to hit staff. Episodes of incontinence noted. Daughter Tiffany visited early part of shift.  Called to room by sitter 06:30 am. Upon arrival, patient had bitten thru IV line. Infusion stopped and disconnected. Checked patients  Mouth for possible tubing pieces. Flushed IV site it remained clean dry and intact.  Charge Nurse Pam  at bedside and noted.   Also safety sitter(1:1 renewed by provider K. Byere) order renewed. See orders.   Non violent soft restraints expires today 10/11/20 at 11:56 am. Updated day shift RN.

## 2020-10-11 NOTE — Discharge Summary (Signed)
Physician Discharge Summary  [name] DJS:[id number] DOB: [birth year] DOA: 10/09/2020  PCP: [name] Ebbs, MD  Admit date: 10/09/2020 Discharge date: 10/11/2020  Admitted From:home Disposition: home  Recommendations for Outpatient Follow-up:  1. Follow up with PCP in 1-2 weeks 2. Please obtain BMP/CBC in one week 3. Please follow up with psychiatry 4. Please follow-up on FTA antibody test.  Home Health: None Equipment/Devices: None Discharge Condition stable CODE STATUS: Full code Diet recommendation: Cardiac Brief/Interim Summary:[name] is a 55 y.o. female with medical history significant for polysubstance abuse, schizoaffective disorder, COPD, and insulin-dependent diabetes mellitus, presenting to the emergency department for evaluation of confusion and agitation.  She is accompanied by a daughter who assists with the history.  Patient has been living alone while her husband is staying with his ill parents, seem to be in her usual state yesterday when she spoke with her daughter on the phone, was noted to be a little bit difficult to understand and possibly confused when she spoke with her husband on the phone at approximately 1 PM, and then her daughter received a phone call from the patient's friend later in the afternoon reporting that the patient was not acting like her usual self.  EMS was called and the patient's daughter arrived on the scene at the same time as EMS.  Patient had her pants around her ankles and was having trouble pulling them up, did not recognize her daughter, was agitated, and could not be redirected.  Patient had been with her friend who she is known to have abused illicit substances with in the past but he was unwilling to provide much information today.  Patient had not been complaining of anything last time she had spoken with family.  At her baseline, she is alert, fully oriented, able to care for herself, drives, and pays her own bills.  ED Course:  Upon arrival to the ED, patient is found to be afebrile, slightly tachycardic, saturating well on room air, and with stable blood pressure.  EKG features sinus tachycardia with rate 117 and QTc interval 418 ms.  Chest x-ray negative for acute cardiopulmonary disease.  Head CT negative for acute intracranial abnormality.  CMP unremarkable.  CBC with stable microcytic anemia.  UDS positive for cocaine only.  Salicylate and acetaminophen levels undetectable.  Urinalysis notable for low specific gravity.  COVID-19 screening test was negative.  The patient was given a liter of saline and IM Ativan in the ED.   Discharge Diagnoses:  Principal Problem:   Acute encephalopathy Active Problems:   COPD (chronic obstructive pulmonary disease) (HCC)   Type 2 diabetes mellitus with diabetic polyneuropathy, with long-term current use of insulin (HCC)   Schizoaffective disorder, bipolar type (HCC)   Polysubstance (excluding opioids) dependence, daily use (HCC)   #1 acute encephalopathy -this was thought to be likely related to psychosis from substance abuse.  She has had multiple hospital admissions in the past with similar presentation secondary to substance abuse and intoxication which improved once the toxin has been metabolized in her system.  Initial drug screen was positive only for cocaine.  A send out test has been pending at the time of discharge.  No evidence of active infection was noted during this admission.  CT head did not reveal acute findings UA was negative COVID was negative. She was seen in consultation by psych who has cleared her for discharge home.  She should have outpatient substance abuse follow-up.  Patient at this time not interested  in doing so.  #2 type 2 diabetes on metformin and NovoLog sliding scale at home.  #3 history of COPD continue to use inhaler/nebs as needed.  She is on Symbicort and Spiriva.  #4 polysubstance abuse psych consulted discussed outpatient substance abuse  referral which patient refused.  #5 schizo affective disorder-she is on Abilify 400 mg every 28 days.  She is also on Xanax 1 mg twice a day at home.   #6 tobacco abuse we will start nicotine patch.   Estimated body mass index is 21.42 kg/m as calculated from the following:   Height as of this encounter: 5' 2.4" (1.585 m).   Weight as of this encounter: 53.8 kg.  Discharge Instructions  Discharge Instructions    Diet - low sodium heart healthy   Complete by: As directed    Increase activity slowly   Complete by: As directed      Allergies as of 10/11/2020      Reactions   Morphine And Related Nausea And Vomiting, Other (See Comments)   Pt stated she is not allergic to medication   Aspirin Nausea And Vomiting, Other (See Comments)   Ok to take tylenol or ibuprofen      Medication List    STOP taking these medications   gabapentin 300 MG capsule Commonly known as: Neurontin     TAKE these medications   Abilify Maintena 400 MG Prsy prefilled syringe Generic drug: ARIPiprazole ER Inject 400 mg as directed every 28 (twenty-eight) days.   albuterol 108 (90 Base) MCG/ACT inhaler Commonly known as: ProAir HFA Inhale 2 puffs into the lungs every 6 (six) hours as needed for wheezing or shortness of breath.   ALPRAZolam 1 MG tablet Commonly known as: XANAX Take 1 tablet (1 mg total) by mouth 2 (two) times daily as needed for anxiety.   blood glucose meter kit and supplies Kit Dispense based on patient and insurance preference. Use up to four times daily as directed. (FOR ICD-9 250.00, 250.01).   Cholecalciferol 25 MCG (1000 UT) capsule Take 1 capsule (1,000 Units total) by mouth daily.   ferrous sulfate 325 (65 FE) MG tablet Take 1 tablet (325 mg total) by mouth daily with breakfast.   guaiFENesin 600 MG 12 hr tablet Commonly known as: MUCINEX Take 1 tablet (600 mg total) by mouth 2 (two) times daily.   ibuprofen 800 MG tablet Commonly known as: ADVIL Take 800 mg  by mouth 3 (three) times daily as needed for pain.   insulin aspart 100 UNIT/ML FlexPen Commonly known as: NOVOLOG Insulin sliding scale: Blood sugar  120-150   3units                       151-200   4units                       201-250   7units                       251- 300  11units                       301-350   15uints                       351-400   20units                       >  400         call MD immediately   Insulin Pen Needle 29G X 12MM Misc novolog sliding scale insulin three times a day as needed   ipratropium-albuterol 0.5-2.5 (3) MG/3ML Soln Commonly known as: DUONEB Take 3 mLs by nebulization every 6 (six) hours as needed for wheezing or shortness of breath.   metFORMIN 500 MG tablet Commonly known as: Glucophage Take 1 tablet (500 mg total) by mouth 2 (two) times daily with a meal.   multivitamin with minerals Tabs tablet Take 1 tablet by mouth 2 (two) times daily.   nicotine 21 mg/24hr patch Commonly known as: NICODERM CQ - dosed in mg/24 hours Place 1 patch (21 mg total) onto the skin daily. (May purchase from over the counter): For nicotine withdrawal symptoms   Symbicort 160-4.5 MCG/ACT inhaler Generic drug: budesonide-formoterol Inhale 2 puffs into the lungs daily as needed (sob and wheezing).   tiotropium 18 MCG inhalation capsule Commonly known as: Spiriva HandiHaler Place 1 capsule (18 mcg total) into inhaler and inhale daily. For COPD       Follow-up Information    [name] Ebbs, MD Follow up.   Specialty: Internal Medicine Contact information: [place] [address] [phone]              Allergies  Allergen Reactions  . Morphine And Related Nausea And Vomiting and Other (See Comments)    Pt stated she is not allergic to medication  . Aspirin Nausea And Vomiting and Other (See Comments)    Ok to take tylenol or ibuprofen    Consultations: Psychiatry  Procedures/Studies: CT Head Wo Contrast  Result  Date: 10/09/2020 CLINICAL DATA:  Delirium, utilized crack cocaine EXAM: CT HEAD WITHOUT CONTRAST TECHNIQUE: Contiguous axial images were obtained from the base of the skull through the vertex without intravenous contrast. COMPARISON:  None. FINDINGS: Brain: No evidence of acute infarction, hemorrhage, hydrocephalus, extra-axial collection, visible mass lesion or mass effect. Vascular: Atherosclerotic calcification of the carotid siphons. No hyperdense vessel. Skull: No calvarial fracture or suspicious osseous lesion. No scalp swelling or hematoma. Sinuses/Orbits: Paranasal sinuses are predominantly clear. Trace left mastoid effusion. Middle ear cavities are clear. Included orbital structures are unremarkable. Other: None IMPRESSION: 1. No acute intracranial abnormality. 2. Trace left mastoid effusion. Electronically Signed   By: Lovena Le M.D.   On: 10/09/2020 23:05   DG Chest Portable 1 View  Result Date: 10/09/2020 CLINICAL DATA:  cough, combative, held for x-ray EXAM: PORTABLE CHEST 1 VIEW COMPARISON:  07/24/2020 FINDINGS: No consolidation, features of edema, pneumothorax, or effusion. Pulmonary vascularity is normally distributed. The cardiomediastinal contours are unremarkable. No acute osseous or soft tissue abnormality. IMPRESSION: No acute cardiopulmonary abnormality. Electronically Signed   By: Lovena Le M.D.   On: 10/09/2020 22:23    (Echo, Carotid, EGD, Colonoscopy, ERCP)    Subjective: Patient resting in bed awake alert answering questions appropriately follows commands anxious to go home discussed with patient's daughter on the phone they will pick her up later today and someone will be staying with her 24/7.  Discharge Exam: Vitals:   10/11/20 0955 10/11/20 1342  BP: 128/71 120/70  Pulse: (!) 106 (!) 114  Resp: 20 17  Temp: 98.1 F (36.7 C) 98.3 F (36.8 C)  SpO2: 92% 95%   Vitals:   10/11/20 0148 10/11/20 0558 10/11/20 0955 10/11/20 1342  BP: 123/72 122/72 128/71 120/70   Pulse: 96 (!) 105 (!) 106 (!) 114  Resp: 16 16 20  17  Temp:  97.8 F (36.6 C) 98.1 F (36.7 C) 98.3 F (36.8 C)  TempSrc:  Oral Oral Oral  SpO2: 100% 96% 92% 95%  Weight:      Height:        General: Pt is alert, awake, not in acute distress Cardiovascular: RRR, S1/S2 +, no rubs, no gallops Respiratory: CTA bilaterally, no wheezing, no rhonchi Abdominal: Soft, NT, ND, bowel sounds + Extremities: no edema, no cyanosis    The results of significant diagnostics from this hospitalization (including imaging, microbiology, ancillary and laboratory) are listed below for reference.     Microbiology: Recent Results (from the past 240 hour(s))  Resp Panel by RT-PCR (Flu A&B, Covid) Urine, Clean Catch     Status: None   Collection Time: 10/09/20  9:54 PM   Specimen: Urine, Clean Catch; Nasopharyngeal(NP) swabs in vial transport medium  Result Value Ref Range Status   SARS Coronavirus 2 by RT PCR NEGATIVE NEGATIVE Final    Comment: (NOTE) SARS-CoV-2 target nucleic acids are NOT DETECTED.  The SARS-CoV-2 RNA is generally detectable in upper respiratory specimens during the acute phase of infection. The lowest concentration of SARS-CoV-2 viral copies this assay can detect is 138 copies/mL. A negative result does not preclude SARS-Cov-2 infection and should not be used as the sole basis for treatment or other patient management decisions. A negative result may occur with  improper specimen collection/handling, submission of specimen other than nasopharyngeal swab, presence of viral mutation(s) within the areas targeted by this assay, and inadequate number of viral copies(<138 copies/mL). A negative result must be combined with clinical observations, patient history, and epidemiological information. The expected result is Negative.  Fact Sheet for Patients:  EntrepreneurPulse.com.au  Fact Sheet for Healthcare Providers:   IncredibleEmployment.be  This test is no t yet approved or cleared by the [place] FDA and  has been authorized for detection and/or diagnosis of SARS-CoV-2 by FDA under an Emergency Use Authorization (EUA). This EUA will remain  in effect (meaning this test can be used) for the duration of the COVID-19 declaration under Section 564(b)(1) of the Act, 21 U.S.C.section 360bbb-3(b)(1), unless the authorization is terminated  or revoked sooner.       Influenza A by PCR NEGATIVE NEGATIVE Final   Influenza B by PCR NEGATIVE NEGATIVE Final    Comment: (NOTE) The Xpert Xpress SARS-CoV-2/FLU/RSV plus assay is intended as an aid in the diagnosis of influenza from Nasopharyngeal swab specimens and should not be used as a sole basis for treatment. Nasal washings and aspirates are unacceptable for Xpert Xpress SARS-CoV-2/FLU/RSV testing.  Fact Sheet for Patients: EntrepreneurPulse.com.au  Fact Sheet for Healthcare Providers: IncredibleEmployment.be  This test is not yet approved or cleared by the [place] FDA and has been authorized for detection and/or diagnosis of SARS-CoV-2 by FDA under an Emergency Use Authorization (EUA). This EUA will remain in effect (meaning this test can be used) for the duration of the COVID-19 declaration under Section 564(b)(1) of the Act, 21 U.S.C. section 360bbb-3(b)(1), unless the authorization is terminated or revoked.  Performed at [facility], [place] [address], [place], [place] [address]      Labs: BNP (last 3 results) No results for input(s): BNP in the last 8760 hours. Basic Metabolic Panel: Recent Labs  Lab 10/09/20 1830 10/10/20 0135  NA 139 142  K 4.2 4.2  CL 104 107  CO2 27 25  GLUCOSE 145* 143*  BUN 8 8  CREATININE 0.66 0.60  CALCIUM 9.4 9.0   Liver Function Tests: Recent Labs  Lab 10/09/20 1830  AST 24  ALT 16  ALKPHOS 73  BILITOT 0.7  PROT  8.0  ALBUMIN 4.1   No results for input(s): LIPASE, AMYLASE in the last 168 hours. Recent Labs  Lab 10/10/20 0759  AMMONIA 18   CBC: Recent Labs  Lab 10/09/20 1830 10/10/20 0135  WBC 8.4 9.2  NEUTROABS 5.8  --   HGB 10.4* 9.8*  HCT 35.3* 33.2*  MCV 70.9* 71.9*  PLT 338 319   Cardiac Enzymes: No results for input(s): CKTOTAL, CKMB, CKMBINDEX, TROPONINI in the last 168 hours. BNP: Invalid input(s): POCBNP CBG: Recent Labs  Lab 10/10/20 0820 10/10/20 1202 10/10/20 2058 10/11/20 0738 10/11/20 1219  GLUCAP 130* 156* 78 106* 201*   D-Dimer No results for input(s): DDIMER in the last 72 hours. Hgb A1c No results for input(s): HGBA1C in the last 72 hours. Lipid Profile No results for input(s): CHOL, HDL, LDLCALC, TRIG, CHOLHDL, LDLDIRECT in the last 72 hours. Thyroid function studies Recent Labs    10/10/20 0135  TSH 2.720   Anemia work up Recent Labs    10/10/20 0135  VITAMINB12 409   Urinalysis    Component Value Date/Time   COLORURINE STRAW (A) 10/09/2020 2030   APPEARANCEUR CLEAR 10/09/2020 2030   LABSPEC 1.004 (L) 10/09/2020 2030   PHURINE 6.0 10/09/2020 2030   GLUCOSEU NEGATIVE 10/09/2020 2030   [place] NEGATIVE 10/09/2020 2030   [place] NEGATIVE 10/09/2020 2030   [place] 10/09/2020 2030   PROTEINUR NEGATIVE 10/09/2020 2030   UROBILINOGEN 0.2 09/04/2013 0426   NITRITE NEGATIVE 10/09/2020 2030   LEUKOCYTESUR NEGATIVE 10/09/2020 2030   Sepsis Labs Invalid input(s): PROCALCITONIN,  WBC,  LACTICIDVEN Microbiology Recent Results (from the past 240 hour(s))  Resp Panel by RT-PCR (Flu A&B, Covid) Urine, Clean Catch     Status: None   Collection Time: 10/09/20  9:54 PM   Specimen: Urine, Clean Catch; Nasopharyngeal(NP) swabs in vial transport medium  Result Value Ref Range Status   SARS Coronavirus 2 by RT PCR NEGATIVE NEGATIVE Final    Comment: (NOTE) SARS-CoV-2 target nucleic acids are NOT DETECTED.  The SARS-CoV-2 RNA is generally  detectable in upper respiratory specimens during the acute phase of infection. The lowest concentration of SARS-CoV-2 viral copies this assay can detect is 138 copies/mL. A negative result does not preclude SARS-Cov-2 infection and should not be used as the sole basis for treatment or other patient management decisions. A negative result may occur with  improper specimen collection/handling, submission of specimen other than nasopharyngeal swab, presence of viral mutation(s) within the areas targeted by this assay, and inadequate number of viral copies(<138 copies/mL). A negative result must be combined with clinical observations, patient history, and epidemiological information. The expected result is Negative.  Fact Sheet for Patients:  EntrepreneurPulse.com.au  Fact Sheet for Healthcare Providers:  IncredibleEmployment.be  This test is no t yet approved or cleared by the [place] FDA and  has been authorized for detection and/or diagnosis of SARS-CoV-2 by FDA under an Emergency Use Authorization (EUA). This EUA will remain  in effect (meaning this test can be used) for the duration of the COVID-19 declaration under Section 564(b)(1) of the Act, 21 U.S.C.section 360bbb-3(b)(1), unless the authorization is terminated  or revoked sooner.       Influenza A by PCR NEGATIVE NEGATIVE Final   Influenza B by PCR NEGATIVE NEGATIVE Final    Comment: (NOTE) The Xpert Xpress  SARS-CoV-2/FLU/RSV plus assay is intended as an aid in the diagnosis of influenza from Nasopharyngeal swab specimens and should not be used as a sole basis for treatment. Nasal washings and aspirates are unacceptable for Xpert Xpress SARS-CoV-2/FLU/RSV testing.  Fact Sheet for Patients: EntrepreneurPulse.com.au  Fact Sheet for Healthcare Providers: IncredibleEmployment.be  This test is not yet approved or cleared by the [place] FDA  and has been authorized for detection and/or diagnosis of SARS-CoV-2 by FDA under an Emergency Use Authorization (EUA). This EUA will remain in effect (meaning this test can be used) for the duration of the COVID-19 declaration under Section 564(b)(1) of the Act, 21 U.S.C. section 360bbb-3(b)(1), unless the authorization is terminated or revoked.  Performed at [facility], [place] [address], [place], [place] [address]      Time coordinating discharge: 39 minutes  SIGNED: E Deadrick Stidd

## 2020-10-11 NOTE — Consult Note (Signed)
[facility] Face-to-Face Psychiatry Consult   Reason for Consult: Psychosis Referring Physician:  *Dr. Zigmund Daniel Patient Identification: [name] MRN:  [id number] Principal Diagnosis: Acute encephalopathy Diagnosis:  Principal Problem:   Acute encephalopathy Active Problems:   COPD (chronic obstructive pulmonary disease) ([place])   Type 2 diabetes mellitus with diabetic polyneuropathy, with long-term current use of insulin (HCC)   Schizoaffective disorder, bipolar type ([place])   Polysubstance (excluding opioids) dependence, daily use ([place])   Total Time spent with patient: 45 minutes  Subjective:   [name] is a 55 y.o. female patient admitted with confusion and agitation.  Patient is seen and assessed face to face by this nurse practitioner, chart reviewed, family present at bedside.  Patient is alert and oriented x4, calm and cooperative, and pleasant yet frustrated due to presence of four-point restraints in place.  Writer assess for orientation, prior to removing four-point restraints at 11:23 AM.  Patient was then observed with minimal assistance ambulating to the restroom, after immediately removing her purewick.  She then returned to bed after washing her hands, and began to eat voraciously.  Throughout this initial interaction, patient was able to recall events leading up to this admission.  There does appear to be a 2-day delay in her mentation and her normal state.  She also admits to substance abuse, the day of admission.  She is able to verbalize her current diagnosis of schizoaffective disorder, currently receiving long-acting injectable Abilify Maintena.  She is receiving [facility], ACT team in place, see her daily.  She denies any recent hospitalizations, last 1 on file: Behavioral health in 2020 secondary to substance use and psychosis.  Patient has had 1 previous suicide attempt when she was a teenager.   On evaluation [name] is a  55 year old female with a history significant for schizoaffective, bipolar type, polysubstance abuse history, who presented to the ED with confusion and agitation.  Per chart review patient has had multiple inpatient psychiatric admissions secondary to substance abuse and intoxication, which improved shortly after metabolization which indicates substance induced psychosis as a likely diagnosis.  Patient denies any depression, anxiety, irritability, hallucinations.  On today's evaluation she is able to hold a linear conversation, coherent, with improved affect this morning compared to day prior.  She is amenable to receive inpatient substance abuse treatment, however wishes to be involved in the search.  Patient currently lives along with her husband, has 3 daughters who are actively involved in her care and decision making 1 of whom is present at the bedside.  Patient endorses sleeping well and having a good appetite.  She denies any suicidal ideations, homicidal ideations, and or auditory or visual hallucinations.  She denies any side effects from her medications.  All questions comments and concerns were addressed by both patient and daughter.  At this time patient is psychiatrically cleared, to discharge home with outpatient/inpatient substance abuse treatment.   HPI:  [name] is a 55 y.o. female with medical history significant for polysubstance abuse, schizoaffective disorder, COPD, and insulin-dependent diabetes mellitus, presenting to the emergency department for evaluation of confusion and agitation.  She is accompanied by a daughter who assists with the history.  Patient has been living alone while her husband is staying with his ill parents, seem to be in her usual state yesterday when she spoke with her daughter on the phone, was noted to be a little bit difficult to understand and possibly confused when she spoke  with her husband on the phone at approximately 1 PM, and then her daughter received  a phone call from the patient's friend later in the afternoon reporting that the patient was not acting like her usual self.  EMS was called and the patient's daughter arrived on the scene at the same time as EMS.  Patient had her pants around her ankles and was having trouble pulling them up, did not recognize her daughter, was agitated, and could not be redirected.  Patient had been with her friend who she is known to have abused illicit substances with in the past but he was unwilling to provide much information today.  Patient had not been complaining of anything last time she had spoken with family.  At her baseline, she is alert, fully oriented, able to care for herself, drives, and pays her own bills.  Past Psychiatric History: Polysubstance abuse, Depression, Schizoaffective;Bipolar type,  Previous dx's of schizophrenia, bipolar - Multiple inpatient stays (at least 3-4) [facility] [place], [facility], previously had ACT team - hx of suicide attempt x1 via overdose as a teen  Risk to Self:  No Risk to Others:   No Prior Inpatient Therapy:  [facility] 2020, secondary to substance use and psychosis Prior Outpatient Therapy: [facility], ACT Team services  Past Medical History:  Past Medical History:  Diagnosis Date  . Abdominal pain   . Accidental drug overdose April 2013  . Anxiety   . Atrial fibrillation ([place]) 09/29/11   converted spontaneously  . Chronic back pain   . Chronic knee pain   . Chronic nausea   . Chronic pain   . COPD (chronic obstructive pulmonary disease) ([place])   . Depression   . Diabetes mellitus    states her doctor took her off all DM meds in past month  . Diabetic neuropathy ([place])   . Dyspnea    with exertion   . GERD (gastroesophageal reflux disease)   . Headache(784.0)    migraines   . HTN (hypertension)    not on meds since in a year   . Hyperlipidemia   . Hypothyroidism    not on meds in a while   . Mental disorder     Bipolar and schizophrenic  . Requires supplemental oxygen    as needed per patient   . Schizophrenia ([place])   . Schizophrenia, acute ([place]) 11/13/2017  . Tobacco abuse     Past Surgical History:  Procedure Laterality Date  . ABDOMINAL HYSTERECTOMY    . BLADDER SUSPENSION  03/04/2011   Procedure: [facility] PROCEDURE;  Surgeon: Elayne Snare MacDiarmid;  Location: [facility];  Service: Urology;  Laterality: N/A;  . CYSTOCELE REPAIR  03/04/2011   Procedure: ANTERIOR REPAIR (CYSTOCELE);  Surgeon: Reece Packer;  Location: [facility];  Service: Urology;  Laterality: N/A;  . CYSTOSCOPY  03/04/2011   Procedure: CYSTOSCOPY;  Surgeon: Elayne Snare MacDiarmid;  Location: [facility];  Service: Urology;  Laterality: N/A;  . ESOPHAGOGASTRODUODENOSCOPY (EGD) WITH PROPOFOL N/A 05/12/2017   Procedure: ESOPHAGOGASTRODUODENOSCOPY (EGD) WITH PROPOFOL;  Surgeon: Alphonsa Overall, MD;  Location: [facility];  Service: General;  Laterality: N/A;  . GASTRIC ROUX-EN-Y N/A 03/25/2016   Procedure: LAPAROSCOPIC ROUX-EN-Y GASTRIC BYPASS WITH UPPER ENDOSCOPY;  Surgeon: Excell Seltzer, MD;  Location: [facility];  Service: General;  Laterality: N/A;  . KNEE SURGERY    . LAPAROSCOPIC ASSISTED VAGINAL HYSTERECTOMY  03/04/2011   Procedure: LAPAROSCOPIC ASSISTED VAGINAL HYSTERECTOMY;  Surgeon: Sharyn Lull  Lynett Fish, MD;  Location: [facility];  Service: Gynecology;  Laterality: N/A;   Family History:  Family History  Problem Relation Age of Onset  . Heart attack Father        29s  . Diabetes Mother   . Heart disease Mother   . Hypertension Mother   . Heart attack Sister        3  . COPD Other   . Breast cancer Neg Hx    Family Psychiatric  History: Mother, maternal cousin and maternal aunt with depression. No family hx of suicide attempt/completion.   Social History:  Social History   Substance and Sexual Activity  Alcohol Use Yes   Comment: daily     Social History   Substance and Sexual Activity  Drug Use Yes  . Types: Cocaine, Marijuana,  "Crack" cocaine    Social History   Socioeconomic History  . Marital status: Legally Separated    Spouse name: Not on file  . Number of children: Not on file  . Years of education: Not on file  . Highest education level: Not on file  Occupational History  . Not on file  Tobacco Use  . Smoking status: Current Every Day Smoker    Packs/day: 0.50    Types: Cigarettes  . Smokeless tobacco: Never Used  Vaping Use  . Vaping Use: Never used  Substance and Sexual Activity  . Alcohol use: Yes    Comment: daily  . Drug use: Yes    Types: Cocaine, Marijuana, "Crack" cocaine  . Sexual activity: Yes    Partners: Male  Other Topics Concern  . Not on file  Social History Narrative  . Not on file   Social Determinants of Health   Financial Resource Strain: Not on file  Food Insecurity: Not on file  Transportation Needs: Not on file  Physical Activity: Not on file  Stress: Not on file  Social Connections: Not on file   Additional Social History:    Allergies:   Allergies  Allergen Reactions  . Morphine And Related Nausea And Vomiting and Other (See Comments)    Pt stated she is not allergic to medication  . Aspirin Nausea And Vomiting and Other (See Comments)    Ok to take tylenol or ibuprofen    Labs:  Results for orders placed or performed during the hospital encounter of 10/09/20 (from the past 48 hour(s))  CBC with Differential     Status: Abnormal   Collection Time: 10/09/20  6:30 PM  Result Value Ref Range   WBC 8.4 4.0 - 10.5 K/uL   RBC 4.98 3.87 - 5.11 MIL/uL   Hemoglobin 10.4 (L) 12.0 - 15.0 g/dL   HCT 35.3 (L) 36.0 - 46.0 %   MCV 70.9 (L) 80.0 - 100.0 fL   MCH 20.9 (L) 26.0 - 34.0 pg   MCHC 29.5 (L) 30.0 - 36.0 g/dL   RDW 17.5 (H) 11.5 - 15.5 %   Platelets 338 150 - 400 K/uL   nRBC 0.0 0.0 - 0.2 %   Neutrophils Relative % 68 %   Neutro Abs 5.8 1.7 - 7.7 K/uL   Lymphocytes Relative 23 %   Lymphs Abs 1.9 0.7 - 4.0 K/uL   Monocytes Relative 7 %    Monocytes Absolute 0.6 0.1 - 1.0 K/uL   Eosinophils Relative 1 %   Eosinophils Absolute 0.0 0.0 - 0.5 K/uL   Basophils Relative 1 %   Basophils Absolute 0.0 0.0 - 0.1 K/uL  Immature Granulocytes 0 %   Abs Immature Granulocytes 0.03 0.00 - 0.07 K/uL    Comment: Performed at [facility], [place] [address], [place], [place] [address]  Comprehensive metabolic panel     Status: Abnormal   Collection Time: 10/09/20  6:30 PM  Result Value Ref Range   Sodium 139 135 - 145 mmol/L   Potassium 4.2 3.5 - 5.1 mmol/L   Chloride 104 98 - 111 mmol/L   CO2 27 22 - 32 mmol/L   Glucose, Bld 145 (H) 70 - 99 mg/dL    Comment: Glucose reference range applies only to samples taken after fasting for at least 8 hours.   BUN 8 6 - 20 mg/dL   Creatinine, Ser 0.66 0.44 - 1.00 mg/dL   Calcium 9.4 8.9 - 10.3 mg/dL   Total Protein 8.0 6.5 - 8.1 g/dL   Albumin 4.1 3.5 - 5.0 g/dL   AST 24 15 - 41 U/L   ALT 16 0 - 44 U/L   Alkaline Phosphatase 73 38 - 126 U/L   Total Bilirubin 0.7 0.3 - 1.2 mg/dL   GFR, Estimated >60 >60 mL/min    Comment: (NOTE) Calculated using the CKD-EPI Creatinine Equation (2021)    Anion gap 8 5 - 15    Comment: Performed at [facility], [place] [address] [place], [place] [address]  Acetaminophen level     Status: Abnormal   Collection Time: 10/09/20  6:30 PM  Result Value Ref Range   Acetaminophen (Tylenol), Serum <10 (L) 10 - 30 ug/mL    Comment: (NOTE) Therapeutic concentrations vary significantly. A range of 10-30 ug/mL  may be an effective concentration for many patients. However, some  are best treated at concentrations outside of this range. Acetaminophen concentrations >150 ug/mL at 4 hours after ingestion  and >50 ug/mL at 12 hours after ingestion are often associated with  toxic reactions.  Performed at [facility], [place] [address], [place], [place] [address]   Salicylate level     Status: Abnormal   Collection  Time: 10/09/20  6:30 PM  Result Value Ref Range   Salicylate Lvl <0.0 (L) 7.0 - 30.0 mg/dL    Comment: Performed at [facility], [place] [address], [place], [place] [address]  Urinalysis, Routine w reflex microscopic Urine, Clean Catch     Status: Abnormal   Collection Time: 10/09/20  8:30 PM  Result Value Ref Range   Color, Urine STRAW (A) YELLOW   APPearance CLEAR CLEAR   Specific Gravity, Urine 1.004 (L) 1.005 - 1.030   pH 6.0 5.0 - 8.0   Glucose, UA NEGATIVE NEGATIVE mg/dL   Hgb urine dipstick NEGATIVE NEGATIVE   Bilirubin Urine NEGATIVE NEGATIVE   Ketones, ur NEGATIVE NEGATIVE mg/dL   Protein, ur NEGATIVE NEGATIVE mg/dL   Nitrite NEGATIVE NEGATIVE   Leukocytes,Ua NEGATIVE NEGATIVE    Comment: Performed at [place] [address] [place],  [address]  Rapid urine drug screen (hospital performed)     Status: Abnormal   Collection Time: 10/09/20  8:30 PM  Result Value Ref Range   Opiates NONE DETECTED NONE DETECTED   Cocaine POSITIVE (A) NONE DETECTED   Benzodiazepines NONE DETECTED NONE DETECTED   Amphetamines NONE DETECTED NONE DETECTED   Tetrahydrocannabinol NONE DETECTED NONE DETECTED   Barbiturates NONE DETECTED NONE DETECTED    Comment: (NOTE) DRUG SCREEN FOR MEDICAL PURPOSES ONLY.  IF CONFIRMATION IS NEEDED FOR ANY PURPOSE, NOTIFY  LAB WITHIN 5 DAYS.  LOWEST DETECTABLE LIMITS FOR URINE DRUG SCREEN Drug Class                     Cutoff (ng/mL) Amphetamine and metabolites    1000 Barbiturate and metabolites    200 Benzodiazepine                 809 Tricyclics and metabolites     300 Opiates and metabolites        300 Cocaine and metabolites        300 THC                            50 Performed at [facility], [place] [address], [place], [place] [address]   Resp Panel by RT-PCR (Flu A&B, Covid) Urine, Clean Catch     Status: None   Collection Time: 10/09/20  9:54 PM   Specimen: Urine, Clean Catch;  Nasopharyngeal(NP) swabs in vial transport medium  Result Value Ref Range   SARS Coronavirus 2 by RT PCR NEGATIVE NEGATIVE    Comment: (NOTE) SARS-CoV-2 target nucleic acids are NOT DETECTED.  The SARS-CoV-2 RNA is generally detectable in upper respiratory specimens during the acute phase of infection. The lowest concentration of SARS-CoV-2 viral copies this assay can detect is 138 copies/mL. A negative result does not preclude SARS-Cov-2 infection and should not be used as the sole basis for treatment or other patient management decisions. A negative result may occur with  improper specimen collection/handling, submission of specimen other than nasopharyngeal swab, presence of viral mutation(s) within the areas targeted by this assay, and inadequate number of viral copies(<138 copies/mL). A negative result must be combined with clinical observations, patient history, and epidemiological information. The expected result is Negative.  Fact Sheet for Patients:  EntrepreneurPulse.com.au  Fact Sheet for Healthcare Providers:  IncredibleEmployment.be  This test is no t yet approved or cleared by the [place] FDA and  has been authorized for detection and/or diagnosis of SARS-CoV-2 by FDA under an Emergency Use Authorization (EUA). This EUA will remain  in effect (meaning this test can be used) for the duration of the COVID-19 declaration under Section 564(b)(1) of the Act, 21 U.S.C.section 360bbb-3(b)(1), unless the authorization is terminated  or revoked sooner.       Influenza A by PCR NEGATIVE NEGATIVE   Influenza B by PCR NEGATIVE NEGATIVE    Comment: (NOTE) The Xpert Xpress SARS-CoV-2/FLU/RSV plus assay is intended as an aid in the diagnosis of influenza from Nasopharyngeal swab specimens and should not be used as a sole basis for treatment. Nasal washings and aspirates are unacceptable for Xpert Xpress  SARS-CoV-2/FLU/RSV testing.  Fact Sheet for Patients: EntrepreneurPulse.com.au  Fact Sheet for Healthcare Providers: IncredibleEmployment.be  This test is not yet approved or cleared by the [place] FDA and has been authorized for detection and/or diagnosis of SARS-CoV-2 by FDA under an Emergency Use Authorization (EUA). This EUA will remain in effect (meaning this test can be used) for the duration of the COVID-19 declaration under Section 564(b)(1) of the Act, 21 U.S.C. section 360bbb-3(b)(1), unless the authorization is terminated or revoked.  Performed at [facility], [place] [address], [place], [place] [address]   Ethanol     Status: None   Collection Time: 10/10/20  1:35 AM  Result Value Ref Range   Alcohol, Ethyl (B) <10 <10 mg/dL    Comment: (NOTE)  Lowest detectable limit for serum alcohol is 10 mg/dL.  For medical purposes only. Performed at [facility], [place] [address], [place], [place] [address]   TSH     Status: None   Collection Time: 10/10/20  1:35 AM  Result Value Ref Range   TSH 2.720 0.350 - 4.500 uIU/mL    Comment: Performed by a 3rd Generation assay with a functional sensitivity of <=0.01 uIU/mL. Performed at [facility], [place] [address] [place], [place] [address]   Vitamin B12     Status: None   Collection Time: 10/10/20  1:35 AM  Result Value Ref Range   Vitamin B-12 409 180 - 914 pg/mL    Comment: (NOTE) This assay is not validated for testing neonatal or myeloproliferative syndrome specimens for Vitamin B12 levels. Performed at [facility], [place] [address] [place], [place] [address]   RPR     Status: Abnormal   Collection Time: 10/10/20  1:35 AM  Result Value Ref Range   RPR Ser Ql Reactive (A) NON REACTIVE    Comment: SENT FOR CONFIRMATION   RPR Titer 1:1     Comment: Performed at [place] [address],  [place], [place] [address]  HIV Antibody (routine testing w rflx)     Status: None   Collection Time: 10/10/20  1:35 AM  Result Value Ref Range   HIV Screen 4th Generation wRfx Non Reactive Non Reactive    Comment: Performed at [facility], [place] [address] [place], [place] [address]  Basic metabolic panel     Status: Abnormal   Collection Time: 10/10/20  1:35 AM  Result Value Ref Range   Sodium 142 135 - 145 mmol/L   Potassium 4.2 3.5 - 5.1 mmol/L   Chloride 107 98 - 111 mmol/L   CO2 25 22 - 32 mmol/L   Glucose, Bld 143 (H) 70 - 99 mg/dL    Comment: Glucose reference range applies only to samples taken after fasting for at least 8 hours.   BUN 8 6 - 20 mg/dL   Creatinine, Ser 0.60 0.44 - 1.00 mg/dL   Calcium 9.0 8.9 - 10.3 mg/dL   GFR, Estimated >60 >60 mL/min    Comment: (NOTE) Calculated using the CKD-EPI Creatinine Equation (2021)    Anion gap 10 5 - 15    Comment: Performed at [facility], [place] [address] [place] [address] [place], [place] [address]  CBC     Status: Abnormal   Collection Time: 10/10/20  1:35 AM  Result Value Ref Range   WBC 9.2 4.0 - 10.5 K/uL   RBC 4.62 3.87 - 5.11 MIL/uL   Hemoglobin 9.8 (L) 12.0 - 15.0 g/dL   HCT 33.2 (L) 36.0 - 46.0 %   MCV 71.9 (L) 80.0 - 100.0 fL   MCH 21.2 (L) 26.0 - 34.0 pg   MCHC 29.5 (L) 30.0 - 36.0 g/dL   RDW 17.8 (H) 11.5 - 15.5 %   Platelets 319 150 - 400 K/uL   nRBC 0.0 0.0 - 0.2 %    Comment: Performed at [facility], [place] [address] [place], [place] [address]  Ammonia     Status: None   Collection Time: 10/10/20  7:59 AM  Result Value Ref Range   Ammonia 18 9 - 35 umol/L    Comment: Performed at [facility], [place] [address] [place], [place] [address]  Glucose, capillary  Status: Abnormal   Collection Time: 10/10/20  8:20 AM  Result Value Ref Range   Glucose-Capillary 130 (H) 70 - 99 mg/dL    Comment: Glucose reference range applies only to samples taken after fasting for  at least 8 hours.  Glucose, capillary     Status: Abnormal   Collection Time: 10/10/20 12:02 PM  Result Value Ref Range   Glucose-Capillary 156 (H) 70 - 99 mg/dL    Comment: Glucose reference range applies only to samples taken after fasting for at least 8 hours.  Glucose, capillary     Status: None   Collection Time: 10/10/20  8:58 PM  Result Value Ref Range   Glucose-Capillary 78 70 - 99 mg/dL    Comment: Glucose reference range applies only to samples taken after fasting for at least 8 hours.  Glucose, capillary     Status: Abnormal   Collection Time: 10/11/20  7:38 AM  Result Value Ref Range   Glucose-Capillary 106 (H) 70 - 99 mg/dL    Comment: Glucose reference range applies only to samples taken after fasting for at least 8 hours.    Current Facility-Administered Medications  Medication Dose Route Frequency Provider Last Rate Last Admin  . 0.9 %  sodium chloride infusion   Intravenous Continuous Georgette Shell, MD 150 mL/hr at 10/11/20 0354 Infusion Verify at 10/11/20 0354  . acetaminophen (TYLENOL) tablet 650 mg  650 mg Oral Q6H PRN Opyd, Ilene Qua, MD       Or  . acetaminophen (TYLENOL) suppository 650 mg  650 mg Rectal Q6H PRN Opyd, Ilene Qua, MD      . albuterol (VENTOLIN HFA) 108 (90 Base) MCG/ACT inhaler 2 puff  2 puff Inhalation Q6H PRN Opyd, Ilene Qua, MD      . ALPRAZolam Duanne Moron) tablet 1 mg  1 mg Oral BID PRN Opyd, Ilene Qua, MD   1 mg at 10/10/20 2005  . dicyclomine (BENTYL) tablet 20 mg  20 mg Oral Q6H PRN Georgette Shell, MD      . enoxaparin (LOVENOX) injection 30 mg  30 mg Subcutaneous Q24H Opyd, Ilene Qua, MD   30 mg at 10/11/20 1043  . haloperidol lactate (HALDOL) injection 5 mg  5 mg Intramuscular Q6H PRN Opyd, Ilene Qua, MD   5 mg at 10/10/20 2328  . hydrOXYzine (ATARAX/VISTARIL) tablet 25 mg  25 mg Oral Q6H PRN Georgette Shell, MD   25 mg at 10/11/20 0549  . insulin aspart (novoLOG) injection 0-5 Units  0-5 Units Subcutaneous QHS Opyd, Timothy  S, MD      . insulin aspart (novoLOG) injection 0-9 Units  0-9 Units Subcutaneous TID WC Opyd, Ilene Qua, MD   1 Units at 10/10/20 0930  . loperamide (IMODIUM) capsule 2-4 mg  2-4 mg Oral PRN Georgette Shell, MD      . LORazepam (ATIVAN) injection 0.5 mg  0.5 mg Intravenous Q6H PRN Suella Broad, FNP   0.5 mg at 10/11/20 0146  . methocarbamol (ROBAXIN) tablet 500 mg  500 mg Oral Q8H PRN Georgette Shell, MD   500 mg at 10/11/20 0549  . mometasone-formoterol (DULERA) 200-5 MCG/ACT inhaler 2 puff  2 puff Inhalation BID Opyd, Ilene Qua, MD   2 puff at 10/11/20 1047  . naproxen (NAPROSYN) tablet 500 mg  500 mg Oral BID PRN Georgette Shell, MD      . nicotine (NICODERM CQ - dosed in mg/24 hours) patch 14 mg  14 mg Transdermal  Daily Georgette Shell, MD   14 mg at 10/11/20 1043  . ondansetron (ZOFRAN-ODT) disintegrating tablet 4 mg  4 mg Oral Q6H PRN Georgette Shell, MD      . risperiDONE (RISPERDAL) tablet 1 mg  1 mg Oral BID Suella Broad, FNP   1 mg at 10/11/20 1043  . senna-docusate (Senokot-S) tablet 1 tablet  1 tablet Oral QHS PRN Opyd, Ilene Qua, MD      . thiamine (B-1) 300 mg in sodium chloride 0.9 % 50 mL IVPB  300 mg Intravenous TID Georgette Shell, MD 100 mL/hr at 10/11/20 1048 300 mg at 10/11/20 1048  . umeclidinium bromide (INCRUSE ELLIPTA) 62.5 MCG/INH 1 puff  1 puff Inhalation Daily Opyd, Ilene Qua, MD   1 puff at 10/10/20 0841    Musculoskeletal: Strength & Muscle Tone: within normal limits Gait & Station: normal Patient leans: N/A   Psychiatric Specialty Exam:  Presentation  General Appearance: Appropriate for Environment; Casual  Eye Contact:Fair  Speech:Clear and Coherent; Normal Rate  Speech Volume:Normal  Handedness:Right   Mood and Affect  Mood:Anxious  Affect:Blunt; Congruent; Appropriate   Thought Process  Thought Processes:Coherent; Goal Directed  Descriptions of Associations:Intact  Orientation:Full  (Time, Place and Person)  Thought Content:WDL  History of Schizophrenia/Schizoaffective disorder:No data recorded Duration of Psychotic Symptoms:No data recorded Hallucinations:Hallucinations: None  Ideas of Reference:None  Suicidal Thoughts:Suicidal Thoughts: No  Homicidal Thoughts:Homicidal Thoughts: No   Sensorium  Memory:Immediate Good; Recent Poor; Remote Good  Judgment:Fair  Insight:Fair   Executive Functions  Concentration:Fair  Attention Span:Fair  [place] of Knowledge:Good  Language:Good   Psychomotor Activity  Psychomotor Activity:Psychomotor Activity: Tremor   Assets  Assets:Communication Skills; Desire for Improvement; Financial Resources/Insurance; Housing; Leisure Time; Intimacy; Social Support   Sleep  Sleep:Sleep: Fair   Physical Exam: Physical Exam ROS Blood pressure 128/71, pulse (!) 106, temperature 98.1 F (36.7 C), temperature source Oral, resp. rate 20, height 5' 2.4" (1.585 m), weight 53.8 kg, last menstrual period 01/08/2011, SpO2 92 %. Body mass index is 21.42 kg/m.  Treatment Plan Summary: Plan Continue prn medications. Patient counseled about substance use and increase substance induced psychosis.  -Psych cleared at this time. Patient is to remain under IVC, as she recently became agitated and combative this morning. She Is able to verbalize that nursing staff were taunting her this morning, and she requested help and her requests went unanswered.  -Restraints have been removed, safety sitter to remain in place at this time.  -Continue prn medications, does not need any additional medications at discharge as she has ACT services in place.  -SW to coordinate after care with [facility], upon discharge.  Updated Dr. Rodman Key about her improvement in mental status, daughter courtney at the bedside who will update the family. She also verbalizes understanding of mothers unwillingness to quit substances at this time.    Disposition: No evidence of imminent risk to self or others at present.   Patient does not meet criteria for psychiatric inpatient admission. Recommend inpatient substance facility however patient does not wish to go at this time. She is counseled again on her need to quit. SHe verbalizes understanding and wants to research facilities and pick which place she goes too. Daughter also verbalizes understanding.   Suella Broad, FNP 10/11/2020 11:51 AM

## 2020-10-12 LAB — FLUORESCENT TREPONEMAL AB(FTA)-IGG-BLD: Fluorescent Treponemal Ab, IgG: REACTIVE — AB

## 2020-10-17 LAB — DRUG PROFILE, UR, 9 DRUGS (LABCORP)
Amphetamines, Urine: NEGATIVE ng/mL
Barbiturate, Ur: NEGATIVE ng/mL
Benzodiazepine Quant, Ur: NEGATIVE ng/mL
Cannabinoid Quant, Ur: NEGATIVE ng/mL
Cocaine (Metab.): POSITIVE ng/mL — AB
Methadone Screen, Urine: NEGATIVE ng/mL
Opiate Quant, Ur: NEGATIVE ng/mL
Phencyclidine, Ur: NEGATIVE ng/mL
Propoxyphene, Urine: NEGATIVE ng/mL

## 2020-10-19 ENCOUNTER — Encounter (HOSPITAL_COMMUNITY): Payer: Self-pay | Admitting: *Deleted

## 2020-10-23 LAB — VITAMIN B1: Vitamin B1 (Thiamine): 133.1 nmol/L (ref 66.5–200.0)

## 2020-12-17 ENCOUNTER — Institutional Professional Consult (permissible substitution): Payer: Medicare Other | Admitting: Pulmonary Disease

## 2021-03-28 ENCOUNTER — Other Ambulatory Visit: Payer: Self-pay | Admitting: Internal Medicine

## 2021-03-29 LAB — COMPLETE METABOLIC PANEL WITH GFR
AG Ratio: 1.2 (calc) (ref 1.0–2.5)
ALT: 17 U/L (ref 6–29)
AST: 26 U/L (ref 10–35)
Albumin: 3.6 g/dL (ref 3.6–5.1)
Alkaline phosphatase (APISO): 67 U/L (ref 37–153)
BUN/Creatinine Ratio: 11 (calc) (ref 6–22)
BUN: 6 mg/dL — ABNORMAL LOW (ref 7–25)
CO2: 27 mmol/L (ref 20–32)
Calcium: 8.2 mg/dL — ABNORMAL LOW (ref 8.6–10.4)
Chloride: 93 mmol/L — ABNORMAL LOW (ref 98–110)
Creat: 0.57 mg/dL (ref 0.50–1.03)
Globulin: 3 g/dL (calc) (ref 1.9–3.7)
Glucose, Bld: 208 mg/dL — ABNORMAL HIGH (ref 65–99)
Potassium: 3.6 mmol/L (ref 3.5–5.3)
Sodium: 133 mmol/L — ABNORMAL LOW (ref 135–146)
Total Bilirubin: 0.4 mg/dL (ref 0.2–1.2)
Total Protein: 6.6 g/dL (ref 6.1–8.1)
eGFR: 107 mL/min/{1.73_m2} (ref >=60)

## 2021-03-29 LAB — LIPID PANEL
Cholesterol: 100 mg/dL (ref <200)
HDL: 38 mg/dL — ABNORMAL LOW (ref >=50)
LDL Cholesterol (Calc): 49 mg/dL (calc)
Non-HDL Cholesterol (Calc): 62 mg/dL (calc) (ref <130)
Total CHOL/HDL Ratio: 2.6 (calc) (ref <5.0)
Triglycerides: 44 mg/dL (ref <150)

## 2021-03-29 LAB — CBC
HCT: 30.2 % — ABNORMAL LOW (ref 35.0–45.0)
Hemoglobin: 8.5 g/dL — ABNORMAL LOW (ref 11.7–15.5)
MCH: 18.8 pg — ABNORMAL LOW (ref 27.0–33.0)
MCHC: 28.1 g/dL — ABNORMAL LOW (ref 32.0–36.0)
MCV: 66.7 fL — ABNORMAL LOW (ref 80.0–100.0)
MPV: 10.2 fL (ref 7.5–12.5)
Platelets: 339 10*3/uL (ref 140–400)
RBC: 4.53 10*6/uL (ref 3.80–5.10)
RDW: 17.9 % — ABNORMAL HIGH (ref 11.0–15.0)
WBC: 6.3 10*3/uL (ref 3.8–10.8)

## 2021-03-29 LAB — VITAMIN D 25 HYDROXY (VIT D DEFICIENCY, FRACTURES): Vit D, 25-Hydroxy: 20 ng/mL — ABNORMAL LOW (ref 30–100)

## 2021-03-29 LAB — TSH: TSH: 1.87 mIU/L (ref 0.40–4.50)

## 2021-05-28 DIAGNOSIS — H9202 Otalgia, left ear: Secondary | ICD-10-CM | POA: Insufficient documentation

## 2021-05-28 DIAGNOSIS — J3489 Other specified disorders of nose and nasal sinuses: Secondary | ICD-10-CM | POA: Insufficient documentation

## 2021-05-28 DIAGNOSIS — K1379 Other lesions of oral mucosa: Secondary | ICD-10-CM | POA: Insufficient documentation

## 2021-05-28 DIAGNOSIS — R1310 Dysphagia, unspecified: Secondary | ICD-10-CM | POA: Insufficient documentation

## 2021-05-29 ENCOUNTER — Other Ambulatory Visit: Payer: Self-pay | Admitting: Physician Assistant

## 2021-05-29 DIAGNOSIS — H9202 Otalgia, left ear: Secondary | ICD-10-CM

## 2021-07-26 ENCOUNTER — Emergency Department (HOSPITAL_COMMUNITY): Payer: Medicare Other

## 2021-07-26 ENCOUNTER — Encounter (HOSPITAL_COMMUNITY): Payer: Self-pay | Admitting: Emergency Medicine

## 2021-07-26 ENCOUNTER — Other Ambulatory Visit: Payer: Self-pay

## 2021-07-26 ENCOUNTER — Emergency Department (HOSPITAL_COMMUNITY)
Admission: EM | Admit: 2021-07-26 | Discharge: 2021-07-27 | Disposition: A | Discharge status: Home / Self Care | Payer: Medicare Other | Attending: Emergency Medicine | Admitting: Emergency Medicine

## 2021-07-26 DIAGNOSIS — Z20822 Contact with and (suspected) exposure to covid-19: Secondary | ICD-10-CM | POA: Diagnosis not present

## 2021-07-26 DIAGNOSIS — F1410 Cocaine abuse, uncomplicated: Secondary | ICD-10-CM | POA: Diagnosis not present

## 2021-07-26 DIAGNOSIS — R739 Hyperglycemia, unspecified: Secondary | ICD-10-CM | POA: Insufficient documentation

## 2021-07-26 DIAGNOSIS — R569 Unspecified convulsions: Secondary | ICD-10-CM | POA: Insufficient documentation

## 2021-07-26 DIAGNOSIS — T402X1A Poisoning by other opioids, accidental (unintentional), initial encounter: Secondary | ICD-10-CM | POA: Diagnosis not present

## 2021-07-26 DIAGNOSIS — Z794 Long term (current) use of insulin: Secondary | ICD-10-CM | POA: Diagnosis not present

## 2021-07-26 DIAGNOSIS — Z79899 Other long term (current) drug therapy: Secondary | ICD-10-CM | POA: Insufficient documentation

## 2021-07-26 DIAGNOSIS — R4182 Altered mental status, unspecified: Secondary | ICD-10-CM | POA: Diagnosis not present

## 2021-07-26 LAB — CBG MONITORING, ED: Glucose-Capillary: 184 mg/dL — ABNORMAL HIGH (ref 70–99)

## 2021-07-26 NOTE — ED Notes (Signed)
Patient attempted urine sample. Unsuccessful. 

## 2021-07-26 NOTE — ED Triage Notes (Signed)
Patient presents post alcohol and crack cocaine overdose. She was found by her roommate post crack administration having a seizure for about 10 seconds. This occurred twice. Patient is now alert and ambulatory. EMS administered 500 ml bolus of fluids.   EMS vitals: 326 CBG 128/76 BP 110 HR 97% 2L O2 nasal cannula

## 2021-07-26 NOTE — ED Provider Notes (Signed)
[facility] DEPT Provider Note   CSN: [id number] Arrival date & time: 07/26/21  2242     History  Chief Complaint  Patient presents with   Drug Overdose  Level 5 caveat due to altered mental status  [name] is a 56 y.o. female.  The history is provided by the patient. The history is limited by the condition of the patient.  Drug Overdose This is a new problem. The problem occurs constantly. Nothing aggravates the symptoms. Nothing relieves the symptoms.  Patient presents after having a seizure due to polysubstance abuse.  Patient was found by her roommate after smoking cocaine having a seizure.  She reported she had 2 seizures that terminated spontaneously.  Patient is now more awake.  She claims that she has had seizures previously.  She reports she only smokes cocaine and does not snort it.     Home Medications Prior to Admission medications   Medication Sig Start Date End Date Taking? Authorizing Provider  ABILIFY MAINTENA 400 MG PRSY prefilled syringe Inject 400 mg as directed every 28 (twenty-eight) days. 07/12/18   [provider]  albuterol (PROAIR HFA) 108 (90 Base) MCG/ACT inhaler Inhale 2 puffs into the lungs every 6 (six) hours as needed for wheezing or shortness of breath. 11/16/17   Lindell Spar I, NP  ALPRAZolam Duanne Moron) 1 MG tablet Take 1 tablet (1 mg total) by mouth 2 (two) times daily as needed for anxiety. 07/27/20   Florencia Reasons, MD  blood glucose meter kit and supplies KIT Dispense based on patient and insurance preference. Use up to four times daily as directed. (FOR ICD-9 250.00, 250.01). 07/27/20   Florencia Reasons, MD  Cholecalciferol 25 MCG (1000 UT) capsule Take 1 capsule (1,000 Units total) by mouth daily. 08/03/20   Florencia Reasons, MD  ferrous sulfate 325 (65 FE) MG tablet Take 1 tablet (325 mg total) by mouth daily with breakfast. 07/28/20   Florencia Reasons, MD  guaiFENesin (MUCINEX) 600 MG 12 hr tablet Take 1 tablet (600 mg total) by mouth 2  (two) times daily. 07/27/20   Florencia Reasons, MD  ibuprofen (ADVIL) 800 MG tablet Take 800 mg by mouth 3 (three) times daily as needed for pain. 07/03/20   [provider]  insulin aspart (NOVOLOG) 100 UNIT/ML FlexPen Insulin sliding scale: Blood sugar  120-150   3units                       151-200   4units                       201-250   7units                       251- 300  11units                       301-350   15uints                       351-400   20units                       >400         call MD immediately 07/27/20   Florencia Reasons, MD  Insulin Pen Needle 29G X 12MM MISC novolog sliding scale insulin three times a day as needed 07/27/20   Florencia Reasons, MD  ipratropium-albuterol (DUONEB) 0.5-2.5 (3) MG/3ML SOLN Take 3 mLs by nebulization every 6 (six) hours as needed for wheezing or shortness of breath. 06/06/18   [provider]  metFORMIN (GLUCOPHAGE) 500 MG tablet Take 1 tablet (500 mg total) by mouth 2 (two) times daily with a meal. 07/27/20 07/27/21  Florencia Reasons, MD  Multiple Vitamin (MULTIVITAMIN WITH MINERALS) TABS tablet Take 1 tablet by mouth 2 (two) times daily. 07/27/20   Florencia Reasons, MD  nicotine (NICODERM CQ - DOSED IN MG/24 HOURS) 21 mg/24hr patch Place 1 patch (21 mg total) onto the skin daily. (May purchase from over the counter): For nicotine withdrawal symptoms 11/17/17   Lindell Spar I, NP  SYMBICORT 160-4.5 MCG/ACT inhaler Inhale 2 puffs into the lungs daily as needed (sob and wheezing). 11/16/17   Lindell Spar I, NP  tiotropium (SPIRIVA HANDIHALER) 18 MCG inhalation capsule Place 1 capsule (18 mcg total) into inhaler and inhale daily. For COPD 11/17/17   Lindell Spar I, NP      Allergies    Morphine and related and Aspirin    Review of Systems   Review of Systems  Unable to perform ROS: Mental status change   Physical Exam Updated Vital Signs BP 135/83 (BP Location: Right Arm)    Pulse (!) 118    Temp (!) 97.1 F (36.2 C) (Rectal)    Resp 16    LMP 01/08/2011    SpO2 100%   Physical Exam CONSTITUTIONAL: Mildly disheveled, somnolent HEAD: Normocephalic/atraumatic, no visible trauma EYES: EOMI/PERRL, no nystagmus ENMT: Mucous membranes moist, no tongue lacerations NECK: supple no meningeal signs SPINE/BACK:entire spine nontender CV: S1/S2 noted, tachycardic LUNGS: Lungs are clear to auscultation bilaterally, no apparent distress ABDOMEN: soft, nontender NEURO: Pt is sleeping but easily arousable.  She answers most questions appropriately but then goes back to sleep.  She follows commands.  No facial droop, no arm or leg drift. EXTREMITIES: pulses normal/equal, full ROM SKIN: warm, color normal PSYCH: Unable to assess ED Results / Procedures / Treatments   Labs (all labs ordered are listed, but only abnormal results are displayed) Labs Reviewed  CBC WITH DIFFERENTIAL/PLATELET - Abnormal; Notable for the following components:      Result Value   Hemoglobin 7.9 (*)    HCT 28.5 (*)    MCV 66.7 (*)    MCH 18.5 (*)    MCHC 27.7 (*)    RDW 20.4 (*)    All other components within normal limits  COMPREHENSIVE METABOLIC PANEL - Abnormal; Notable for the following components:   Sodium 133 (*)    Glucose, Bld 166 (*)    Calcium 8.2 (*)    All other components within normal limits  ACETAMINOPHEN LEVEL - Abnormal; Notable for the following components:   Acetaminophen (Tylenol), Serum <10 (*)    All other components within normal limits  RAPID URINE DRUG SCREEN, HOSP PERFORMED - Abnormal; Notable for the following components:   Cocaine POSITIVE (*)    All other components within normal limits  SALICYLATE LEVEL - Abnormal; Notable for the following components:   Salicylate Lvl <6.4 (*)    All other components within normal limits  CBG MONITORING, ED - Abnormal; Notable for the following components:   Glucose-Capillary 184 (*)    All other components within normal limits  RESP PANEL BY RT-PCR (FLU A&B, COVID) ARPGX2  AMMONIA  CK  ETHANOL  HCG,  QUANTITATIVE, PREGNANCY    EKG EKG Interpretation  Date/Time:  Friday July 26 2021  23:21:24 EST Ventricular Rate:  104 PR Interval:  159 QRS Duration: 104 QT Interval:  346 QTC Calculation: 456 R Axis:   65 Text Interpretation: Sinus tachycardia LAE, consider biatrial enlargement RSR' in V1 or V2, probably normal variant Anteroseptal infarct, age indeterminate Confirmed by Ripley Fraise ([id number]) on 07/26/2021 11:27:08 PM  Radiology CT Head Wo Contrast  Result Date: 07/27/2021 CLINICAL DATA:  Seizure, new-onset, no history of trauma. Drug overdose. EXAM: CT HEAD WITHOUT CONTRAST TECHNIQUE: Contiguous axial images were obtained from the base of the skull through the vertex without intravenous contrast. RADIATION DOSE REDUCTION: This exam was performed according to the departmental dose-optimization program which includes automated exposure control, adjustment of the mA and/or kV according to patient size and/or use of iterative reconstruction technique. COMPARISON:  None. FINDINGS: Brain: Normal anatomic configuration. No abnormal intra or extra-axial mass lesion or fluid collection. No abnormal mass effect or midline shift. No evidence of acute intracranial hemorrhage or infarct. Ventricular size is normal. Cerebellum unremarkable. Vascular: Unremarkable Skull: Intact Sinuses/Orbits: Paranasal sinuses are clear. Orbits are unremarkable. Other: Mastoid air cells and middle ear cavities are clear. IMPRESSION: No acute intracranial abnormality. Electronically Signed   By: Fidela Salisbury M.D.   On: 07/27/2021 00:03   DG Chest Port 1 View  Result Date: 07/26/2021 CLINICAL DATA:  Seizure. EXAM: PORTABLE CHEST 1 VIEW COMPARISON:  Chest radiograph 10/09/2010 FINDINGS: The cardiomediastinal contours are normal. The lungs are clear. Pulmonary vasculature is normal. No consolidation, pleural effusion, or pneumothorax. No acute osseous abnormalities are seen. IMPRESSION: No acute chest findings.  Electronically Signed   By: Keith Rake M.D.   On: 07/26/2021 23:40    Procedures .Critical Care Performed by: Ripley Fraise, MD Authorized by: Ripley Fraise, MD   Critical care provider statement:    Critical care time (minutes):  60   Critical care start time:  07/26/2021 11:30 PM   Critical care end time:  07/27/2021 12:30 AM   Critical care time was exclusive of:  Separately billable procedures and treating other patients   Critical care was necessary to treat or prevent imminent or life-threatening deterioration of the following conditions:  CNS failure or compromise and toxidrome   Critical care was time spent personally by me on the following activities:  Development of treatment plan with patient or surrogate, evaluation of patient's response to treatment, examination of patient, re-evaluation of patient's condition, pulse oximetry, ordering and review of radiographic studies, ordering and review of laboratory studies, review of old charts, ordering and performing treatments and interventions and discussions with consultants   I assumed direction of critical care for this patient from another provider in my specialty: no      Medications Ordered in ED Medications - No data to display  ED Course/ Medical Decision Making/ A&P Clinical Course as of 07/27/21 2500  Sat Jul 27, 2021  0014 Glucose-Capillary(!): 184 [DW]  0014 Glucose-Capillary(!): 184 Hyperglycemia [DW]  0015 CT head is negative, patient stable at this time [DW]  0040 Hemoglobin(!): 7.9 Anemia is noted [DW]  0040 Patient resting comfortably, easily arousable [DW]  0221 Discussed the case with on-call neurology Dr. Lorrin Goodell.  Seizure likely due to underlying cocaine abuse.  He recommends loading dose with Keppra but none to go home with.  She can follow with neurology as an outpatient.  We will monitor in the ER for several hours to ensure no further seizures [DW]  [phone] Patient has been resting several hours  without any further seizures.  Advised  the patient cannot have any driving until seen by neurology.  Discussed need for cessation of cocaine abuse [DW]    Clinical Course User Index [DW] Ripley Fraise, MD                           Medical Decision Making Amount and/or Complexity of Data Reviewed Labs: ordered. Decision-making details documented in ED Course. Radiology: ordered. ECG/medicine tests: ordered.  Risk Prescription drug management.   This patient presents to the ED for concern of seizure and drug abuse, this involves an extensive number of treatment options, and is a complaint that carries with it a high risk of complications and morbidity.  The differential diagnosis includes status epilepticus, intracranial hemorrhage, stroke, electrolyte abnormality, toxidrome  Comorbidities that complicate the patient evaluation: Patients presentation is complicated by their history of diabetes and substance use disorder  Social Determinants of Health: Patients history of drug abuse increases the complexity of managing their presentation  Additional history obtained:  Records reviewed previous admission documents  Lab Tests: I Ordered, and personally interpreted labs.  The pertinent results include: Hyperglycemia  Imaging Studies ordered: I ordered imaging studies including CT scan head and X-ray chest I independently visualized and interpreted imaging which showed no acute findings I agree with the radiologist interpretation  Cardiac Monitoring: The patient was maintained on a cardiac monitor.  I personally viewed and interpreted the cardiac monitor which showed an underlying rhythm of:  sinus tachycardia  Medicines ordered and prescription drug management: I ordered medication including Keppra for seizures Reevaluation of the patient after these medicines showed that the patient    improved   Critical Interventions:       IV Keppra  Consultations Obtained: I  requested consultation with the consultant neurology, and discussed  findings as well as pertinent plan - they recommend: Keppra load and outpatient follow-up  Reevaluation: After the interventions noted above, I reevaluated the patient and found that they have :improved  Complexity of problems addressed: Patients presentation is most consistent with  acute presentation with potential threat to life or bodily function      Disposition: After consideration of the diagnostic results and the patients response to treatment,  I feel that the patent would benefit from discharge .            Final Clinical Impression(s) / ED Diagnoses Final diagnoses:  Cocaine abuse [facility])  Seizure [facility])    Rx / [place] Orders ED Discharge Orders     None         Ripley Fraise, MD 07/27/21 [phone]

## 2021-07-27 LAB — CBC WITH DIFFERENTIAL/PLATELET
Abs Immature Granulocytes: 0.03 10*3/uL (ref 0.00–0.07)
Basophils Absolute: 0 10*3/uL (ref 0.0–0.1)
Basophils Relative: 0 %
Eosinophils Absolute: 0.1 10*3/uL (ref 0.0–0.5)
Eosinophils Relative: 1 %
HCT: 28.5 % — ABNORMAL LOW (ref 36.0–46.0)
Hemoglobin: 7.9 g/dL — ABNORMAL LOW (ref 12.0–15.0)
Immature Granulocytes: 0 %
Lymphocytes Relative: 16 %
Lymphs Abs: 1.1 10*3/uL (ref 0.7–4.0)
MCH: 18.5 pg — ABNORMAL LOW (ref 26.0–34.0)
MCHC: 27.7 g/dL — ABNORMAL LOW (ref 30.0–36.0)
MCV: 66.7 fL — ABNORMAL LOW (ref 80.0–100.0)
Monocytes Absolute: 0.4 10*3/uL (ref 0.1–1.0)
Monocytes Relative: 6 %
Neutro Abs: 5.2 10*3/uL (ref 1.7–7.7)
Neutrophils Relative %: 77 %
Platelets: 327 10*3/uL (ref 150–400)
RBC: 4.27 MIL/uL (ref 3.87–5.11)
RDW: 20.4 % — ABNORMAL HIGH (ref 11.5–15.5)
WBC: 6.8 10*3/uL (ref 4.0–10.5)
nRBC: 0 % (ref 0.0–0.2)

## 2021-07-27 LAB — COMPREHENSIVE METABOLIC PANEL
ALT: 20 U/L (ref 0–44)
AST: 30 U/L (ref 15–41)
Albumin: 3.6 g/dL (ref 3.5–5.0)
Alkaline Phosphatase: 104 U/L (ref 38–126)
Anion gap: 6 (ref 5–15)
BUN: 7 mg/dL (ref 6–20)
CO2: 25 mmol/L (ref 22–32)
Calcium: 8.2 mg/dL — ABNORMAL LOW (ref 8.9–10.3)
Chloride: 102 mmol/L (ref 98–111)
Creatinine, Ser: 0.58 mg/dL (ref 0.44–1.00)
GFR, Estimated: >60 mL/min (ref >60)
Glucose, Bld: 166 mg/dL — ABNORMAL HIGH (ref 70–99)
Potassium: 4.3 mmol/L (ref 3.5–5.1)
Sodium: 133 mmol/L — ABNORMAL LOW (ref 135–145)
Total Bilirubin: 0.8 mg/dL (ref 0.3–1.2)
Total Protein: 7.1 g/dL (ref 6.5–8.1)

## 2021-07-27 LAB — RAPID URINE DRUG SCREEN, HOSP PERFORMED
Amphetamines: NOT DETECTED
Barbiturates: NOT DETECTED
Benzodiazepines: NOT DETECTED
Cocaine: POSITIVE — AB
Opiates: NOT DETECTED
Tetrahydrocannabinol: NOT DETECTED

## 2021-07-27 LAB — CK: Total CK: 162 U/L (ref 38–234)

## 2021-07-27 LAB — AMMONIA: Ammonia: 13 umol/L (ref 9–35)

## 2021-07-27 LAB — ACETAMINOPHEN LEVEL: Acetaminophen (Tylenol), Serum: <10 ug/mL — ABNORMAL LOW (ref 10–30)

## 2021-07-27 LAB — SALICYLATE LEVEL: Salicylate Lvl: <7 mg/dL — ABNORMAL LOW (ref 7.0–30.0)

## 2021-07-27 LAB — HCG, QUANTITATIVE, PREGNANCY: hCG, Beta Chain, Quant, S: 2 m[IU]/mL (ref <5)

## 2021-07-27 LAB — ETHANOL: Alcohol, Ethyl (B): <10 mg/dL (ref <10)

## 2021-07-27 LAB — RESP PANEL BY RT-PCR (FLU A&B, COVID) ARPGX2
Influenza A by PCR: NEGATIVE
Influenza B by PCR: NEGATIVE
SARS Coronavirus 2 by RT PCR: NEGATIVE

## 2021-07-27 MED ORDER — LEVETIRACETAM IN NACL 1000 MG/100ML IV SOLN
1000.0000 mg | Freq: Once | INTRAVENOUS | Status: AC
  Administered 2021-07-27: 1000 mg via INTRAVENOUS
  Filled 2021-07-27: qty 100

## 2021-07-27 NOTE — ED Notes (Signed)
Daughter, [name] [phone].

## 2021-07-27 NOTE — ED Notes (Addendum)
Patients daughter, [name] was called. Her other daughter is on the way to pick her up.

## 2021-07-27 NOTE — Discharge Instructions (Addendum)
Please be aware you may have another seizure ° °Do not drive until seen by your physician for your condition ° °Do not climb ladders/roofs/trees as a seizure can occur at that height and cause serious harm ° °Do not bathe/swim alone as a seizure can occur and cause serious harm ° °Please followup with your physician or neurologist for further testing and possible treatment ° ° °

## 2021-08-05 ENCOUNTER — Encounter: Payer: Self-pay | Admitting: Neurology

## 2021-08-05 ENCOUNTER — Ambulatory Visit: Payer: Medicare Other | Admitting: Neurology

## 2021-09-05 ENCOUNTER — Emergency Department (HOSPITAL_COMMUNITY)
Admission: EM | Admit: 2021-09-05 | Discharge: 2021-09-06 | Disposition: A | Discharge status: Home / Self Care | Payer: Medicare Other | Attending: Emergency Medicine | Admitting: Emergency Medicine

## 2021-09-05 ENCOUNTER — Other Ambulatory Visit: Payer: Self-pay

## 2021-09-05 DIAGNOSIS — F1994 Other psychoactive substance use, unspecified with psychoactive substance-induced mood disorder: Secondary | ICD-10-CM | POA: Diagnosis present

## 2021-09-05 DIAGNOSIS — F1910 Other psychoactive substance abuse, uncomplicated: Secondary | ICD-10-CM | POA: Diagnosis present

## 2021-09-05 DIAGNOSIS — J449 Chronic obstructive pulmonary disease, unspecified: Secondary | ICD-10-CM | POA: Diagnosis not present

## 2021-09-05 DIAGNOSIS — F1924 Other psychoactive substance dependence with psychoactive substance-induced mood disorder: Secondary | ICD-10-CM | POA: Diagnosis not present

## 2021-09-05 DIAGNOSIS — Z20822 Contact with and (suspected) exposure to covid-19: Secondary | ICD-10-CM | POA: Insufficient documentation

## 2021-09-05 DIAGNOSIS — I10 Essential (primary) hypertension: Secondary | ICD-10-CM | POA: Insufficient documentation

## 2021-09-05 DIAGNOSIS — R45851 Suicidal ideations: Secondary | ICD-10-CM | POA: Diagnosis not present

## 2021-09-05 DIAGNOSIS — Y906 Blood alcohol level of 120-199 mg/100 ml: Secondary | ICD-10-CM | POA: Diagnosis not present

## 2021-09-05 DIAGNOSIS — Z7951 Long term (current) use of inhaled steroids: Secondary | ICD-10-CM | POA: Insufficient documentation

## 2021-09-05 DIAGNOSIS — Z794 Long term (current) use of insulin: Secondary | ICD-10-CM | POA: Insufficient documentation

## 2021-09-05 LAB — URINALYSIS, ROUTINE W REFLEX MICROSCOPIC
Bilirubin Urine: NEGATIVE
Glucose, UA: NEGATIVE mg/dL
Hgb urine dipstick: NEGATIVE
Ketones, ur: NEGATIVE mg/dL
Nitrite: NEGATIVE
Protein, ur: NEGATIVE mg/dL
Specific Gravity, Urine: 1.002 — ABNORMAL LOW (ref 1.005–1.030)
pH: 6 (ref 5.0–8.0)

## 2021-09-05 LAB — CBC WITH DIFFERENTIAL/PLATELET
Abs Immature Granulocytes: 0.01 10*3/uL (ref 0.00–0.07)
Basophils Absolute: 0 10*3/uL (ref 0.0–0.1)
Basophils Relative: 1 %
Eosinophils Absolute: 0.1 10*3/uL (ref 0.0–0.5)
Eosinophils Relative: 1 %
HCT: 25.7 % — ABNORMAL LOW (ref 36.0–46.0)
Hemoglobin: 7.1 g/dL — ABNORMAL LOW (ref 12.0–15.0)
Immature Granulocytes: 0 %
Lymphocytes Relative: 30 %
Lymphs Abs: 2.5 10*3/uL (ref 0.7–4.0)
MCH: 18 pg — ABNORMAL LOW (ref 26.0–34.0)
MCHC: 27.6 g/dL — ABNORMAL LOW (ref 30.0–36.0)
MCV: 65.1 fL — ABNORMAL LOW (ref 80.0–100.0)
Monocytes Absolute: 0.5 10*3/uL (ref 0.1–1.0)
Monocytes Relative: 6 %
Neutro Abs: 5.2 10*3/uL (ref 1.7–7.7)
Neutrophils Relative %: 62 %
Platelets: 309 10*3/uL (ref 150–400)
RBC: 3.95 MIL/uL (ref 3.87–5.11)
RDW: 18.9 % — ABNORMAL HIGH (ref 11.5–15.5)
Smear Review: NORMAL
WBC: 8.3 10*3/uL (ref 4.0–10.5)
nRBC: 0 % (ref 0.0–0.2)

## 2021-09-05 LAB — RESP PANEL BY RT-PCR (FLU A&B, COVID) ARPGX2
Influenza A by PCR: NEGATIVE
Influenza B by PCR: NEGATIVE
SARS Coronavirus 2 by RT PCR: NEGATIVE

## 2021-09-05 LAB — COMPREHENSIVE METABOLIC PANEL
ALT: 24 U/L (ref 0–44)
AST: 25 U/L (ref 15–41)
Albumin: 3.4 g/dL — ABNORMAL LOW (ref 3.5–5.0)
Alkaline Phosphatase: 87 U/L (ref 38–126)
Anion gap: 12 (ref 5–15)
BUN: 8 mg/dL (ref 6–20)
CO2: 22 mmol/L (ref 22–32)
Calcium: 8.1 mg/dL — ABNORMAL LOW (ref 8.9–10.3)
Chloride: 102 mmol/L (ref 98–111)
Creatinine, Ser: 0.5 mg/dL (ref 0.44–1.00)
GFR, Estimated: >60 mL/min (ref >60)
Glucose, Bld: 79 mg/dL (ref 70–99)
Potassium: 4.1 mmol/L (ref 3.5–5.1)
Sodium: 136 mmol/L (ref 135–145)
Total Bilirubin: 0.4 mg/dL (ref 0.3–1.2)
Total Protein: 6.4 g/dL — ABNORMAL LOW (ref 6.5–8.1)

## 2021-09-05 LAB — RAPID URINE DRUG SCREEN, HOSP PERFORMED
Amphetamines: NOT DETECTED
Barbiturates: NOT DETECTED
Benzodiazepines: NOT DETECTED
Cocaine: POSITIVE — AB
Opiates: NOT DETECTED
Tetrahydrocannabinol: NOT DETECTED

## 2021-09-05 LAB — ETHANOL: Alcohol, Ethyl (B): 168 mg/dL — ABNORMAL HIGH (ref <10)

## 2021-09-05 MED ORDER — THIAMINE HCL 100 MG/ML IJ SOLN
100.0000 mg | Freq: Every day | INTRAMUSCULAR | Status: DC
  Filled 2021-09-05: qty 2

## 2021-09-05 MED ORDER — LORAZEPAM 1 MG PO TABS: 0.0000 mg | ORAL_TABLET | Freq: Four times a day (QID) | ORAL | Status: DC

## 2021-09-05 MED ORDER — THIAMINE HCL 100 MG PO TABS
100.0000 mg | ORAL_TABLET | Freq: Every day | ORAL | Status: DC
  Administered 2021-09-05: 100 mg via ORAL
  Filled 2021-09-05: qty 1

## 2021-09-05 MED ORDER — LORAZEPAM 2 MG/ML IJ SOLN: 0.0000 mg | Freq: Four times a day (QID) | INTRAMUSCULAR | Status: DC

## 2021-09-05 MED ORDER — LORAZEPAM 2 MG/ML IJ SOLN: 0.0000 mg | Freq: Two times a day (BID) | INTRAMUSCULAR | Status: DC

## 2021-09-05 MED ORDER — LORAZEPAM 1 MG PO TABS: 0.0000 mg | ORAL_TABLET | Freq: Two times a day (BID) | ORAL | Status: DC

## 2021-09-05 NOTE — ED Notes (Signed)
IVC given to [place]; Original IVC Paperwork in RED Folder; copies made ?

## 2021-09-05 NOTE — ED Notes (Signed)
Pt in bed with eyes closed, resps even and unlabored, pt arouses easily to verbal stim, pt assisted into purple scrubs by ed tech, pt cooperative, pt 87% on room air when sleeping in bed, pt placed on 2L via [place], pt satting 93% on 2L when sleeping, pt offers no complaints.   ?

## 2021-09-05 NOTE — ED Notes (Signed)
Pt in bed, pt satting 96% on 2L, family at bedside, sitter at bedside, pt has no complaints at this time ?

## 2021-09-05 NOTE — ED Notes (Signed)
Patient belongings inventoried and placed in locker #12.  ?

## 2021-09-05 NOTE — ED Provider Notes (Signed)
4:42 PM ?Patient signed out to me by previous ED physician.  ? ?Pt is a 56 yo female presenting to ED by police after being found walking in road jumping out in front of cars. Pt denying SI on arrival. Evidence of alcohol intoxication due to slurred speech. Waiting for laboratory studies for medical clearance.  ? ? ?Plan: Labs, medical clearance, CIWA, TTS evaluation ? ?Physical Exam  ?BP 117/90 (BP Location: Right Arm)   Pulse 72   Temp 97.9 ?F (36.6 ?C) (Oral)   Resp 20   Ht 5' (1.524 m)   Wt 59 kg   LMP 01/08/2011   SpO2 100%   BMI 25.39 kg/m?  ? ?Physical Exam ?Vitals and nursing note reviewed.  ?Constitutional:   ?   General: She is not in acute distress. ?   Appearance: She is well-developed.  ?HENT:  ?   Head: Normocephalic and atraumatic.  ?Eyes:  ?   Conjunctiva/sclera: Conjunctivae normal.  ?Cardiovascular:  ?   Rate and Rhythm: Normal rate and regular rhythm.  ?   Heart sounds: No murmur heard. ?Pulmonary:  ?   Effort: Pulmonary effort is normal. No respiratory distress.  ?   Breath sounds: Normal breath sounds.  ?Abdominal:  ?   Palpations: Abdomen is soft.  ?   Tenderness: There is no abdominal tenderness.  ?Musculoskeletal:     ?   General: No swelling.  ?   Cervical back: Neck supple.  ?Skin: ?   General: Skin is warm and dry.  ?   Capillary Refill: Capillary refill takes less than 2 seconds.  ?Neurological:  ?   Mental Status: She is alert.  ?Psychiatric:     ?   Mood and Affect: Mood normal.  ? ? ?Procedures  ?Procedures ? ?ED Course / MDM  ?  ?Medical Decision Making ?Amount and/or Complexity of Data Reviewed ?Labs: ordered. ? ?Risk ?OTC drugs. ?Prescription drug management. ? ? ?UDS positive for cocaine ?Ethanol 168 ?Otherwise stable labs.  ? ?Ready to be seen by TTS after clinically sober.  ? ? ? ? ?  ?Lianne Cure, DO ?67/20/94 1923 ? ?

## 2021-09-05 NOTE — ED Notes (Signed)
Called staffing no sitters available at this time, pd remains at bedside  ?

## 2021-09-05 NOTE — ED Triage Notes (Signed)
Pt to er room number 5 via pd, pt states that she doesn't know why she is here, states that she would like news 2, pt states that pt is here because she was drinking and walking in traffic and may have told another officer that she was thinking of slitting her wrists.  Attempting to change pt out into hospital scrubs. Pt escalating, pt refusing, pt states that I am only doing this because I am white and she is a black woman.  ?

## 2021-09-05 NOTE — ED Provider Notes (Signed)
?[place] ?Provider Note ? ? ?CSN: [id number] ?Arrival date & time: 09/05/21  1457 ? ?  ? ?History ? ?Chief Complaint  ?Patient presents with  ? Psychiatric Evaluation  ? ? ?[name] is a 56 y.o. female. ? ?Pt is a 56 yo female with a hx of gerd, copd, bipolar d/o, schizophrenia, htn, and hyperlipidemia.  The police were called out because pt was wandering around in the middle of the street trying to run out in front of cars and banging on the car windows.  She told the police she wanted to kill herself, so she was brought here.  She said she got into a fight with her daughter. She said her daughter does not love her.  She admits to drinking 1 40 oz beer.  She denies si now.  The police filled out IVC  papers. ? ? ?  ? ?Home Medications ?Prior to Admission medications   ?Medication Sig Start Date End Date Taking? Authorizing Provider  ?ABILIFY MAINTENA 400 MG PRSY prefilled syringe Inject 400 mg as directed every 28 (twenty-eight) days. 07/12/18   [provider]  ?albuterol (PROAIR HFA) 108 (90 Base) MCG/ACT inhaler Inhale 2 puffs into the lungs every 6 (six) hours as needed for wheezing or shortness of breath. 11/16/17   Encarnacion Slates, NP  ?ALPRAZolam (XANAX) 1 MG tablet Take 1 tablet (1 mg total) by mouth 2 (two) times daily as needed for anxiety. 07/27/20   Florencia Reasons, MD  ?blood glucose meter kit and supplies KIT Dispense based on patient and insurance preference. Use up to four times daily as directed. (FOR ICD-9 250.00, 250.01). 07/27/20   Florencia Reasons, MD  ?Cholecalciferol 25 MCG (1000 UT) capsule Take 1 capsule (1,000 Units total) by mouth daily. 08/03/20   Florencia Reasons, MD  ?ferrous sulfate 325 (65 FE) MG tablet Take 1 tablet (325 mg total) by mouth daily with breakfast. 07/28/20   Florencia Reasons, MD  ?guaiFENesin (MUCINEX) 600 MG 12 hr tablet Take 1 tablet (600 mg total) by mouth 2 (two) times daily. 07/27/20   Florencia Reasons, MD  ?ibuprofen (ADVIL) 800 MG tablet Take 800 mg by  mouth 3 (three) times daily as needed for pain. 07/03/20   [provider]  ?insulin aspart (NOVOLOG) 100 UNIT/ML FlexPen Insulin sliding scale: ?Blood sugar  120-150   3units ?                      151-200   4units ?                      201-250   7units ?                      251- 300  11units ?                      301-350   15uints ?                      351-400   20units ?                      >400         call MD immediately 07/27/20   Florencia Reasons, MD  ?Insulin Pen Needle 29G X 12MM MISC novolog sliding scale insulin three times a day as needed 07/27/20   Erlinda Hong,  Annamaria Boots, MD  ?ipratropium-albuterol (DUONEB) 0.5-2.5 (3) MG/3ML SOLN Take 3 mLs by nebulization every 6 (six) hours as needed for wheezing or shortness of breath. 06/06/18   [provider]  ?metFORMIN (GLUCOPHAGE) 500 MG tablet Take 1 tablet (500 mg total) by mouth 2 (two) times daily with a meal. 07/27/20 07/27/21  Florencia Reasons, MD  ?Multiple Vitamin (MULTIVITAMIN WITH MINERALS) TABS tablet Take 1 tablet by mouth 2 (two) times daily. 07/27/20   Florencia Reasons, MD  ?nicotine (NICODERM CQ - DOSED IN MG/24 HOURS) 21 mg/24hr patch Place 1 patch (21 mg total) onto the skin daily. (May purchase from over the counter): For nicotine withdrawal symptoms 11/17/17   Lindell Spar I, NP  ?SYMBICORT 160-4.5 MCG/ACT inhaler Inhale 2 puffs into the lungs daily as needed (sob and wheezing). 11/16/17   Lindell Spar I, NP  ?tiotropium (SPIRIVA HANDIHALER) 18 MCG inhalation capsule Place 1 capsule (18 mcg total) into inhaler and inhale daily. For COPD 11/17/17   Encarnacion Slates, NP  ?   ? ?Allergies    ?Morphine and related and Aspirin   ? ?Review of Systems   ?Review of Systems  ?Psychiatric/Behavioral:  Positive for suicidal ideas.   ?All other systems reviewed and are negative. ? ?Physical Exam ?Updated Vital Signs ?BP 117/90 (BP Location: Right Arm)   Pulse 72   Temp 97.9 ?F (36.6 ?C) (Oral)   Resp 20   Ht 5' (1.524 m)   Wt 59 kg   LMP 01/08/2011   SpO2 100%   BMI 25.39  kg/m?  ?Physical Exam ?Vitals and nursing note reviewed.  ?Constitutional:   ?   Appearance: Normal appearance.  ?HENT:  ?   Head: Normocephalic and atraumatic.  ?   Right Ear: External ear normal.  ?   Left Ear: External ear normal.  ?   Nose: Nose normal.  ?   Mouth/Throat:  ?   Mouth: Mucous membranes are moist.  ?   Pharynx: Oropharynx is clear.  ?Eyes:  ?   Extraocular Movements: Extraocular movements intact.  ?   Conjunctiva/sclera: Conjunctivae normal.  ?   Pupils: Pupils are equal, round, and reactive to light.  ?Cardiovascular:  ?   Rate and Rhythm: Normal rate and regular rhythm.  ?   Pulses: Normal pulses.  ?   Heart sounds: Normal heart sounds.  ?Pulmonary:  ?   Effort: Pulmonary effort is normal.  ?   Breath sounds: Normal breath sounds.  ?Abdominal:  ?   General: Abdomen is flat. Bowel sounds are normal.  ?   Palpations: Abdomen is soft.  ?Musculoskeletal:     ?   General: Normal range of motion.  ?   Cervical back: Normal range of motion and neck supple.  ?Skin: ?   General: Skin is warm.  ?   Capillary Refill: Capillary refill takes less than 2 seconds.  ?Neurological:  ?   General: No focal deficit present.  ?   Mental Status: She is alert.  ?   Comments: Pt moving all 4 extremities.  She won't answer orientation questions.  ?Psychiatric:     ?   Mood and Affect: Affect is angry.     ?   Speech: She is noncommunicative.  ? ? ?ED Results / Procedures / Treatments   ?Labs ?(all labs ordered are listed, but only abnormal results are displayed) ?Labs Reviewed  ?COMPREHENSIVE METABOLIC PANEL - Abnormal; Notable for the following components:  ?    Result Value  ?  Calcium 8.1 (*)   ? Total Protein 6.4 (*)   ? Albumin 3.4 (*)   ? All other components within normal limits  ?ETHANOL - Abnormal; Notable for the following components:  ? Alcohol, Ethyl (B) 168 (*)   ? All other components within normal limits  ?CBC WITH DIFFERENTIAL/PLATELET - Abnormal; Notable for the following components:  ? Hemoglobin 7.1 (*)    ? HCT 25.7 (*)   ? MCV 65.1 (*)   ? MCH 18.0 (*)   ? MCHC 27.6 (*)   ? RDW 18.9 (*)   ? All other components within normal limits  ?URINALYSIS, ROUTINE W REFLEX MICROSCOPIC - Abnormal; Notable for the following components:  ? APPearance CLOUDY (*)   ? Specific Gravity, Urine 1.002 (*)   ? Leukocytes,Ua SMALL (*)   ? Bacteria, UA FEW (*)   ? All other components within normal limits  ?RESP PANEL BY RT-PCR (FLU A&B, COVID) [id number]  ?RAPID URINE DRUG SCREEN, HOSP PERFORMED  ? ? ?EKG ?EKG Interpretation ? ?Date/Time:  Thursday September 05 2021 16:00:50 EDT ?Ventricular Rate:  98 ?PR Interval:  132 ?QRS Duration: 105 ?QT Interval:  356 ?QTC Calculation: 455 ?R Axis:   68 ?Text Interpretation: Sinus rhythm Minimal ST elevation, anterior leads Since last tracing rate slower Confirmed by Isla Pence [phone]) on 09/05/2021 4:04:23 PM ? ?Radiology ?No results found. ? ?Procedures ?Procedures  ? ? ?Medications Ordered in ED ?Medications - No data to display ? ?ED Course/ Medical Decision Making/ A&P ?  ?                        ?Medical Decision Making ?Amount and/or Complexity of Data Reviewed ?Labs: ordered. ? ? ?This patient presents to the ED for concern of si, this involves an extensive number of treatment options, and is a complaint that carries with it a high risk of complications and morbidity.  The differential diagnosis includes psychiatric emergency, electrolyte abn, drug and alcohol use ? ? ?Co morbidities that complicate the patient evaluation ? ?erd, copd, bipolar d/o, schizophrenia, htn, and hyperlipidemia ? ? ?Additional history obtained: ? ?Additional history obtained from epic chart review ?External records from outside source obtained and reviewed including police ? ? ?Lab Tests: ? ?I Ordered, and personally interpreted labs.  The pertinent results include:  labs pending at shift change ? ? ?Cardiac Monitoring: ? ?The patient was maintained on a cardiac monitor.  I personally viewed and interpreted the  cardiac monitored which showed an underlying rhythm of: nsr ? ? ? ?Consultations Obtained: ? ?I requested consultation with the TTS,  and discussed lab and imaging findings as well as pertinent plan - TTS pending ? ? ?Pr

## 2021-09-05 NOTE — ED Notes (Signed)
Pt in bed, pd at bedside, blood drawn via 23 gauge butterfly from R hand, pt ambulatory to bathroom with steady gait, pt states that she drank a 84 OZ today and she was not in the street when PD came, states that she has medications at home for her mental health.  Pt more calm at this time, but refuses to change into hospital scrubs.  ?

## 2021-09-05 NOTE — ED Triage Notes (Signed)
Pt refusing to answer triage questions

## 2021-09-05 NOTE — ED Notes (Signed)
Valuables given to security [phone] ?

## 2021-09-06 DIAGNOSIS — F1994 Other psychoactive substance use, unspecified with psychoactive substance-induced mood disorder: Secondary | ICD-10-CM

## 2021-09-06 LAB — CBG MONITORING, ED: Glucose-Capillary: 205 mg/dL — ABNORMAL HIGH (ref 70–99)

## 2021-09-06 MED ORDER — LORAZEPAM 1 MG PO TABS: 1.0000 mg | ORAL_TABLET | Freq: Two times a day (BID) | ORAL | Status: DC

## 2021-09-06 MED ORDER — GABAPENTIN 300 MG PO CAPS: 300.0000 mg | ORAL_CAPSULE | Freq: Three times a day (TID) | ORAL | Status: DC

## 2021-09-06 NOTE — ED Notes (Signed)
TTS in use at this time. ?

## 2021-09-06 NOTE — ED Provider Notes (Signed)
Emergency Medicine Observation Re-evaluation Note ? ?[name] is a 56 y.o. female, seen on rounds today.  Pt initially presented to the ED for complaints of Psychiatric Evaluation ?Currently, the patient is under IVC awaiting psychiatric recommendations after reportedly jumping in front of cars. ? ?Physical Exam  ?BP 138/85   Pulse 98   Temp 99 ?F (37.2 ?C) (Oral)   Resp 18   Ht 5' (1.524 m)   Wt 59 kg   LMP 01/08/2011   SpO2 92%   BMI 25.39 kg/m?  ?Physical Exam ?General: Resting comfortably ?Cardiac: Did not appreciate murmur ?Lungs: Clear ?Psych: Resting ? ?ED Course / MDM  ?EKG:EKG Interpretation ? ?Date/Time:  Thursday September 05 2021 16:00:50 EDT ?Ventricular Rate:  98 ?PR Interval:  132 ?QRS Duration: 105 ?QT Interval:  356 ?QTC Calculation: 455 ?R Axis:   68 ?Text Interpretation: Sinus rhythm Minimal ST elevation, anterior leads Since last tracing rate slower Confirmed by Isla Pence [phone]) on 09/05/2021 4:04:23 PM ? ?I have reviewed the labs performed to date as well as medications administered while in observation.  Recent changes in the last 24 hours include none. ? ?Plan  ?Current plan is for under IVC awaiting psychiatry recommendations.Marland Kitchen ?[name] is under involuntary commitment. ?  ? ?  ?Nyleah Mcginnis, Gwenyth Allegra, MD ?09/06/21 [phone] ? ?

## 2021-09-06 NOTE — Consult Note (Signed)
Telepsych Consultation  ? ?Reason for Consult:  psych eval ?Referring Physician:  Veryl Speak, MD ?Location of Patient:  [facility] ?Location of Provider: [facility] Department ? ?Patient Identification: [name] ?MRN:  [id number] ?Principal Diagnosis: Substance induced mood disorder ([place]) ?Diagnosis:  Principal Problem: ?  Substance induced mood disorder ([place]) ?Active Problems: ?  Polysubstance abuse ([place]) ? ? ?Total Time spent with patient: 20 minutes ? ?Subjective:   ?[name] is a 56 y.o. female patient admitted with psych eval. ?Patient presents laying bed alert and oriented. States "I don't know" when asked reason for admission. "I was crossing the street".  ? ?States she told officer "Its not like I was going to slit my wrist" denying any suicidal ideations or plan. States she does remember "getting high". Says she was hospitalized "years ago" but denies any psychiatric changes requiring treatment but does endorse "receiving mental health all of my life". Says she "has never thought about it" when asked about interest in treatment for substance use. Endorses daily beer intake and regular cocaine use; last use "day before yesterday". Prefers "rock"; crack cocaine. She denies any suicidal or homicidal ideations, auditory or visual hallucinations, and appears calm and cooperative, not responding to any external/internal stimuli at this time.  ? ?HPI:  [name] is a 56 year old female patient who presented to [facility] via law enforcement under IVC after it was reported patient was wandering around the middle of the street trying to run out in front of cars and banging on car windows where it is noted patient stated she wanted to kill herself after she alleges having an argument with her daughter. She admitted to drinking beer; UDS+ cocaine, BAL 168. PDMP reviewed, Alprazolam 1 mg 90 TAB filled 08/28/21.   ? ?Past Psychiatric History: polysubstance abuse, substance induced mood disorder,  schizoaffective disorder bipolar type ? ?Risk to Self:  pt denies ?Risk to Others:  pt denies ?Prior Inpatient Therapy:  pt denies ?Prior Outpatient Therapy:  pt denies ? ?Past Medical History:  ?Past Medical History:  ?Diagnosis Date  ? Abdominal pain   ? Accidental drug overdose April 2013  ? Anxiety   ? Atrial fibrillation ([place]) 09/29/11  ? converted spontaneously  ? Chronic back pain   ? Chronic knee pain   ? Chronic nausea   ? Chronic pain   ? COPD (chronic obstructive pulmonary disease) ([place])   ? Depression   ? Diabetes mellitus   ? states her doctor took her off all DM meds in past month  ? Diabetic neuropathy ([place])   ? Dyspnea   ? with exertion   ? GERD (gastroesophageal reflux disease)   ? Headache(784.0)   ? migraines   ? HTN (hypertension)   ? not on meds since in a year   ? Hyperlipidemia   ? Hypothyroidism   ? not on meds in a while   ? Mental disorder   ? Bipolar and schizophrenic  ? Requires supplemental oxygen   ? as needed per patient   ? Schizophrenia ([place])   ? Schizophrenia, acute ([place]) 11/13/2017  ? Tobacco abuse   ?  ?Past Surgical History:  ?Procedure Laterality Date  ? ABDOMINAL HYSTERECTOMY    ? BLADDER SUSPENSION  03/04/2011  ? Procedure: [facility] PROCEDURE;  Surgeon: Elayne Snare MacDiarmid;  Location: [facility];  Service: Urology;  Laterality: N/A;  ? CYSTOCELE REPAIR  03/04/2011  ? Procedure: ANTERIOR REPAIR (CYSTOCELE);  Surgeon: Reece Packer;  Location: [facility];  Service: Urology;  Laterality: N/A;  ? CYSTOSCOPY  03/04/2011  ? Procedure: CYSTOSCOPY;  Surgeon: Elayne Snare MacDiarmid;  Location: [facility];  Service: Urology;  Laterality: N/A;  ? ESOPHAGOGASTRODUODENOSCOPY (EGD) WITH PROPOFOL N/A 05/12/2017  ? Procedure: ESOPHAGOGASTRODUODENOSCOPY (EGD) WITH PROPOFOL;  Surgeon: Alphonsa Overall, MD;  Location: [facility];  Service: General;  Laterality: N/A;  ? GASTRIC ROUX-EN-Y N/A 03/25/2016  ? Procedure: LAPAROSCOPIC ROUX-EN-Y GASTRIC BYPASS WITH UPPER ENDOSCOPY;  Surgeon: Excell Seltzer, MD;  Location:  [facility];  Service: General;  Laterality: N/A;  ? KNEE SURGERY    ? LAPAROSCOPIC ASSISTED VAGINAL HYSTERECTOMY  03/04/2011  ? Procedure: LAPAROSCOPIC ASSISTED VAGINAL HYSTERECTOMY;  Surgeon: Cyril Mourning, MD;  Location: [facility];  Service: Gynecology;  Laterality: N/A;  ? ?Family History:  ?Family History  ?Problem Relation Age of Onset  ? Heart attack Father   ?     67s  ? Diabetes Mother   ? Heart disease Mother   ? Hypertension Mother   ? Heart attack Sister   ?     48  ? COPD Other   ? Breast cancer Neg Hx   ? ?Family Psychiatric  History: not noted ?Social History:  ?Social History  ? ?Substance and Sexual Activity  ?Alcohol Use Yes  ? Comment: daily  ?   ?Social History  ? ?Substance and Sexual Activity  ?Drug Use Yes  ? Types: Cocaine, Marijuana, "Crack" cocaine  ?  ?Social History  ? ?Socioeconomic History  ? Marital status: Legally Separated  ?  Spouse name: Not on file  ? Number of children: Not on file  ? Years of education: Not on file  ? Highest education level: Not on file  ?Occupational History  ? Not on file  ?Tobacco Use  ? Smoking status: Every Day  ?  Packs/day: 0.50  ?  Types: Cigarettes  ? Smokeless tobacco: Never  ?Vaping Use  ? Vaping Use: Never used  ?Substance and Sexual Activity  ? Alcohol use: Yes  ?  Comment: daily  ? Drug use: Yes  ?  Types: Cocaine, Marijuana, "Crack" cocaine  ? Sexual activity: Yes  ?  Partners: Male  ?Other Topics Concern  ? Not on file  ?Social History Narrative  ? Not on file  ? ?Social Determinants of Health  ? ?Financial Resource Strain: Not on file  ?Food Insecurity: Not on file  ?Transportation Needs: Not on file  ?Physical Activity: Not on file  ?Stress: Not on file  ?Social Connections: Not on file  ? ?Additional Social History: ?  ?Allergies:   ?Allergies  ?Allergen Reactions  ? Morphine And Related Nausea And Vomiting and Other (See Comments)  ?  Pt stated she is not allergic to medication  ? Aspirin Nausea And Vomiting and Other (See Comments)  ?  Ok to  take tylenol or ibuprofen ?  ? ?Labs:  ?Results for orders placed or performed during the hospital encounter of 09/05/21 (from the past 48 hour(s))  ?Urine rapid drug screen (hosp performed)     Status: Abnormal  ? Collection Time: 09/05/21  3:10 PM  ?Result Value Ref Range  ? Opiates NONE DETECTED NONE DETECTED  ? Cocaine POSITIVE (A) NONE DETECTED  ? Benzodiazepines NONE DETECTED NONE DETECTED  ? Amphetamines NONE DETECTED NONE DETECTED  ? Tetrahydrocannabinol NONE DETECTED NONE DETECTED  ? Barbiturates NONE DETECTED NONE DETECTED  ?  Comment: (NOTE) ?DRUG SCREEN FOR MEDICAL PURPOSES ?ONLY.  IF CONFIRMATION IS NEEDED ?FOR  ANY PURPOSE, NOTIFY LAB ?WITHIN 5 DAYS. ? ?LOWEST DETECTABLE LIMITS ?FOR URINE DRUG SCREEN ?Drug Class                     Cutoff (ng/mL) ?Amphetamine and metabolites    1000 ?Barbiturate and metabolites    200 ?Benzodiazepine                 200 ?Tricyclics and metabolites     300 ?Opiates and metabolites        300 ?Cocaine and metabolites        300 ?THC                            50 ?Performed at [facility], [place] [address], [place], [place] ?[address] ?  ?Urinalysis, Routine w reflex microscopic Urine, Clean Catch     Status: Abnormal  ? Collection Time: 09/05/21  3:10 PM  ?Result Value Ref Range  ? Color, Urine YELLOW YELLOW  ? APPearance CLOUDY (A) CLEAR  ? Specific Gravity, Urine 1.002 (L) 1.005 - 1.030  ? pH 6.0 5.0 - 8.0  ? Glucose, UA NEGATIVE NEGATIVE mg/dL  ? Hgb urine dipstick NEGATIVE NEGATIVE  ? Bilirubin Urine NEGATIVE NEGATIVE  ? Ketones, ur NEGATIVE NEGATIVE mg/dL  ? Protein, ur NEGATIVE NEGATIVE mg/dL  ? Nitrite NEGATIVE NEGATIVE  ? Leukocytes,Ua SMALL (A) NEGATIVE  ? RBC / HPF 0-5 0 - 5 RBC/hpf  ? WBC, UA 6-10 0 - 5 WBC/hpf  ? Bacteria, UA FEW (A) NONE SEEN  ? Squamous Epithelial / LPF 0-5 0 - 5  ?  Comment: Performed at [facility], [place] [address] [place], [place] [address]  ?Resp Panel by RT-PCR (Flu A&B, Covid) Nasopharyngeal Swab     Status: None  ?  Collection Time: 09/05/21  3:21 PM  ? Specimen: Nasopharyngeal Swab; Nasopharyngeal(NP) swabs in vial transport medium  ?Result Value Ref Range  ? SARS Coronavirus 2 by RT PCR NEGATIVE NEGATIVE  ?  Comment: (NOTE

## 2021-09-06 NOTE — ED Notes (Signed)
DC instructions reviewed with pt. Spoke with daughter [name] about DC and transportation.  PT DC.  ?

## 2021-09-06 NOTE — ED Notes (Signed)
Pt ambulated to the bathroom without assistance. Pt calm and cooperative ?

## 2021-10-17 ENCOUNTER — Encounter (HOSPITAL_COMMUNITY): Payer: Self-pay | Admitting: *Deleted

## 2021-11-12 ENCOUNTER — Encounter (HOSPITAL_COMMUNITY): Payer: Self-pay | Admitting: Emergency Medicine

## 2021-11-12 ENCOUNTER — Other Ambulatory Visit: Payer: Self-pay

## 2021-11-12 ENCOUNTER — Emergency Department (EMERGENCY_DEPARTMENT_HOSPITAL)
Admission: EM | Admit: 2021-11-12 | Discharge: 2021-11-13 | Disposition: A | Discharge status: Home / Self Care | Payer: Medicare Other | Source: Home / Self Care | Attending: Emergency Medicine | Admitting: Emergency Medicine

## 2021-11-12 DIAGNOSIS — F10129 Alcohol abuse with intoxication, unspecified: Secondary | ICD-10-CM | POA: Insufficient documentation

## 2021-11-12 DIAGNOSIS — Y907 Blood alcohol level of 200-239 mg/100 ml: Secondary | ICD-10-CM | POA: Insufficient documentation

## 2021-11-12 DIAGNOSIS — I10 Essential (primary) hypertension: Secondary | ICD-10-CM | POA: Insufficient documentation

## 2021-11-12 DIAGNOSIS — F1994 Other psychoactive substance use, unspecified with psychoactive substance-induced mood disorder: Secondary | ICD-10-CM | POA: Insufficient documentation

## 2021-11-12 DIAGNOSIS — Z7984 Long term (current) use of oral hypoglycemic drugs: Secondary | ICD-10-CM | POA: Insufficient documentation

## 2021-11-12 DIAGNOSIS — E039 Hypothyroidism, unspecified: Secondary | ICD-10-CM | POA: Insufficient documentation

## 2021-11-12 DIAGNOSIS — E119 Type 2 diabetes mellitus without complications: Secondary | ICD-10-CM | POA: Insufficient documentation

## 2021-11-12 DIAGNOSIS — F250 Schizoaffective disorder, bipolar type: Secondary | ICD-10-CM | POA: Insufficient documentation

## 2021-11-12 DIAGNOSIS — F259 Schizoaffective disorder, unspecified: Secondary | ICD-10-CM | POA: Diagnosis present

## 2021-11-12 DIAGNOSIS — F10920 Alcohol use, unspecified with intoxication, uncomplicated: Secondary | ICD-10-CM

## 2021-11-12 DIAGNOSIS — R45851 Suicidal ideations: Secondary | ICD-10-CM | POA: Insufficient documentation

## 2021-11-12 DIAGNOSIS — Z79899 Other long term (current) drug therapy: Secondary | ICD-10-CM | POA: Insufficient documentation

## 2021-11-12 DIAGNOSIS — J690 Pneumonitis due to inhalation of food and vomit: Secondary | ICD-10-CM | POA: Diagnosis not present

## 2021-11-12 DIAGNOSIS — Z794 Long term (current) use of insulin: Secondary | ICD-10-CM | POA: Insufficient documentation

## 2021-11-12 DIAGNOSIS — J441 Chronic obstructive pulmonary disease with (acute) exacerbation: Secondary | ICD-10-CM | POA: Diagnosis not present

## 2021-11-12 DIAGNOSIS — J449 Chronic obstructive pulmonary disease, unspecified: Secondary | ICD-10-CM | POA: Insufficient documentation

## 2021-11-12 DIAGNOSIS — F10929 Alcohol use, unspecified with intoxication, unspecified: Secondary | ICD-10-CM | POA: Diagnosis present

## 2021-11-12 DIAGNOSIS — F1910 Other psychoactive substance abuse, uncomplicated: Secondary | ICD-10-CM

## 2021-11-12 LAB — COMPREHENSIVE METABOLIC PANEL
ALT: 34 U/L (ref 0–44)
AST: 93 U/L — ABNORMAL HIGH (ref 15–41)
Albumin: 3.7 g/dL (ref 3.5–5.0)
Alkaline Phosphatase: 91 U/L (ref 38–126)
Anion gap: 5 (ref 5–15)
BUN: 9 mg/dL (ref 6–20)
CO2: 27 mmol/L (ref 22–32)
Calcium: 8 mg/dL — ABNORMAL LOW (ref 8.9–10.3)
Chloride: 100 mmol/L (ref 98–111)
Creatinine, Ser: 0.58 mg/dL (ref 0.44–1.00)
GFR, Estimated: >60 mL/min (ref >60)
Glucose, Bld: 94 mg/dL (ref 70–99)
Potassium: 3.7 mmol/L (ref 3.5–5.1)
Sodium: 132 mmol/L — ABNORMAL LOW (ref 135–145)
Total Bilirubin: 0.6 mg/dL (ref 0.3–1.2)
Total Protein: 7 g/dL (ref 6.5–8.1)

## 2021-11-12 LAB — CBC WITH DIFFERENTIAL/PLATELET
Abs Immature Granulocytes: 0.01 10*3/uL (ref 0.00–0.07)
Basophils Absolute: 0 10*3/uL (ref 0.0–0.1)
Basophils Relative: 1 %
Eosinophils Absolute: 0.1 10*3/uL (ref 0.0–0.5)
Eosinophils Relative: 1 %
HCT: 26.2 % — ABNORMAL LOW (ref 36.0–46.0)
Hemoglobin: 7.4 g/dL — ABNORMAL LOW (ref 12.0–15.0)
Immature Granulocytes: 0 %
Lymphocytes Relative: 33 %
Lymphs Abs: 2 10*3/uL (ref 0.7–4.0)
MCH: 18.3 pg — ABNORMAL LOW (ref 26.0–34.0)
MCHC: 28.2 g/dL — ABNORMAL LOW (ref 30.0–36.0)
MCV: 64.7 fL — ABNORMAL LOW (ref 80.0–100.0)
Monocytes Absolute: 0.5 10*3/uL (ref 0.1–1.0)
Monocytes Relative: 8 %
Neutro Abs: 3.4 10*3/uL (ref 1.7–7.7)
Neutrophils Relative %: 57 %
Platelets: 306 10*3/uL (ref 150–400)
RBC: 4.05 MIL/uL (ref 3.87–5.11)
RDW: 19.9 % — ABNORMAL HIGH (ref 11.5–15.5)
WBC: 6 10*3/uL (ref 4.0–10.5)
nRBC: 0 % (ref 0.0–0.2)

## 2021-11-12 LAB — URINALYSIS, ROUTINE W REFLEX MICROSCOPIC
Bilirubin Urine: NEGATIVE
Glucose, UA: NEGATIVE mg/dL
Hgb urine dipstick: NEGATIVE
Ketones, ur: NEGATIVE mg/dL
Leukocytes,Ua: NEGATIVE
Nitrite: NEGATIVE
Protein, ur: NEGATIVE mg/dL
Specific Gravity, Urine: 1.001 — ABNORMAL LOW (ref 1.005–1.030)
pH: 5 (ref 5.0–8.0)

## 2021-11-12 LAB — RAPID URINE DRUG SCREEN, HOSP PERFORMED
Amphetamines: NOT DETECTED
Barbiturates: NOT DETECTED
Benzodiazepines: NOT DETECTED
Cocaine: POSITIVE — AB
Opiates: NOT DETECTED
Tetrahydrocannabinol: NOT DETECTED

## 2021-11-12 LAB — ETHANOL: Alcohol, Ethyl (B): 231 mg/dL — ABNORMAL HIGH (ref <10)

## 2021-11-12 LAB — MAGNESIUM: Magnesium: 2.5 mg/dL — ABNORMAL HIGH (ref 1.7–2.4)

## 2021-11-12 MED ORDER — LORAZEPAM 1 MG PO TABS: 1.0000 mg | ORAL_TABLET | Freq: Four times a day (QID) | ORAL | Status: DC | PRN

## 2021-11-12 MED ORDER — LACTATED RINGERS IV BOLUS: 1000.0000 mL | Freq: Once | INTRAVENOUS | Status: DC

## 2021-11-12 NOTE — ED Provider Notes (Signed)
[facility] DEPT Provider Note   CSN: [id number] Arrival date & time: 11/12/21  1123     History  Chief Complaint  Patient presents with   Alcohol Intoxication   Suicidal    [name] is a 56 y.o. female.  HPI Patient presents for bizarre behavior.  Medical history includes COPD, atrial fibrillation, HTN, T2DM, schizoaffective disorder, polysubstance abuse, chronic pain, anxiety, depression, GERD, HLD, hypothyroidism.  Law enforcement received several 911 calls reporting the patient dancing in the middle of the street.  One of the calls was from her brother.  When officers arrived on scene, she was doing the same.  She did moved to the side of the road when they arrived.  She had an empty 40 ounce bottle of icehouse on her person.  She states that she drank 3 of these.  She is reportedly a user of crack cocaine but denies any recent cocaine use.  She did tell police officer that she wanted to kill herself.  She currently denies this.  Police are currently pursuing IVC paperwork.  Patient states that she feels well physically.  She expresses anger towards the police officers who brought her here.  She also states that her brother called them because he wants her out of the house.  She reports that it is her house and her brother lives with her.    Home Medications Prior to Admission medications   Medication Sig Start Date End Date Taking? Authorizing Provider  metFORMIN (GLUCOPHAGE) 500 MG tablet Take 1 tablet (500 mg total) by mouth 2 (two) times daily with a meal. 07/27/20 12/28/21 Yes Florencia Reasons, MD  ABILIFY MAINTENA 400 MG PRSY prefilled syringe Inject 400 mg as directed every 28 (twenty-eight) days. 07/12/18   [provider]  albuterol (PROAIR HFA) 108 (90 Base) MCG/ACT inhaler Inhale 2 puffs into the lungs every 6 (six) hours as needed for wheezing or shortness of breath. 11/16/17   Lindell Spar I, NP  ALPRAZolam Duanne Moron) 1 MG tablet Take 1 tablet  (1 mg total) by mouth 2 (two) times daily as needed for anxiety. Patient taking differently: Take 1 mg by mouth in the morning, at noon, and at bedtime. 07/27/20   Florencia Reasons, MD  blood glucose meter kit and supplies KIT Dispense based on patient and insurance preference. Use up to four times daily as directed. (FOR ICD-9 250.00, 250.01). 07/27/20   Florencia Reasons, MD  CAPLYTA 42 MG capsule Take 42 mg by mouth at bedtime. 06/12/21   [provider]  Cholecalciferol 25 MCG (1000 UT) capsule Take 1 capsule (1,000 Units total) by mouth daily. 08/03/20   Florencia Reasons, MD  ferrous sulfate 325 (65 FE) MG tablet Take 1 tablet (325 mg total) by mouth daily with breakfast. 07/28/20   Florencia Reasons, MD  guaiFENesin (MUCINEX) 600 MG 12 hr tablet Take 1 tablet (600 mg total) by mouth 2 (two) times daily. 07/27/20   Florencia Reasons, MD  ibuprofen (ADVIL) 800 MG tablet Take 800 mg by mouth 3 (three) times daily as needed for pain. 07/03/20   [provider]  insulin aspart (NOVOLOG) 100 UNIT/ML FlexPen Insulin sliding scale: Blood sugar  120-150   3units                       151-200   4units  201-250   7units                       251- 300  11units                       301-350   15uints                       351-400   20units                       >400         call MD immediately Patient not taking: Reported on 11/12/2021 07/27/20   Florencia Reasons, MD  Insulin Pen Needle 29G X 12MM MISC novolog sliding scale insulin three times a day as needed 07/27/20   Florencia Reasons, MD  levocetirizine (XYZAL) 5 MG tablet Take 5 mg by mouth daily. 09/07/21   [provider]  Multiple Vitamin (MULTIVITAMIN WITH MINERALS) TABS tablet Take 1 tablet by mouth 2 (two) times daily. 07/27/20   Florencia Reasons, MD  nicotine (NICODERM CQ - DOSED IN MG/24 HOURS) 21 mg/24hr patch Place 1 patch (21 mg total) onto the skin daily. (May purchase from over the counter): For nicotine withdrawal symptoms 11/17/17   Lindell Spar I, NP  SUMAtriptan 6  MG/0.5ML SOAJ Inject 6 mg into the muscle daily as needed for migraine. 11/01/21   [provider]  SYMBICORT 160-4.5 MCG/ACT inhaler Inhale 2 puffs into the lungs daily as needed (sob and wheezing). 11/16/17   Lindell Spar I, NP  tiZANidine (ZANAFLEX) 4 MG tablet Take 4 mg by mouth at bedtime as needed for muscle spasms. 07/11/21   [provider]      Allergies    Morphine and related and Aspirin    Review of Systems   Review of Systems  Psychiatric/Behavioral:  Positive for agitation and suicidal ideas.   All other systems reviewed and are negative.  Physical Exam Updated Vital Signs BP (!) 125/59   Pulse 89   Temp 98.5 F (36.9 C) (Oral)   Resp 18   LMP 01/08/2011   SpO2 100%  Physical Exam Vitals and nursing note reviewed.  Constitutional:      General: She is not in acute distress.    Appearance: She is well-developed. She is not toxic-appearing or diaphoretic.  HENT:     Head: Normocephalic and atraumatic.     Right Ear: External ear normal.     Left Ear: External ear normal.     Nose: Nose normal.     Mouth/Throat:     Mouth: Mucous membranes are moist.     Pharynx: Oropharynx is clear.  Eyes:     Extraocular Movements: Extraocular movements intact.     Conjunctiva/sclera: Conjunctivae normal.  Cardiovascular:     Rate and Rhythm: Normal rate and regular rhythm.     Heart sounds: No murmur heard. Pulmonary:     Effort: Pulmonary effort is normal. No respiratory distress.     Breath sounds: Normal breath sounds. No wheezing, rhonchi or rales.  Abdominal:     Palpations: Abdomen is soft.     Tenderness: There is no abdominal tenderness.  Musculoskeletal:        General: No swelling or deformity. Normal range of motion.     Cervical back: Normal range of motion and neck supple.     Right lower leg: No edema.  Left lower leg: No edema.  Skin:    General: Skin is warm and dry.     Capillary Refill: Capillary refill takes less than 2 seconds.      Coloration: Skin is not jaundiced or pale.  Neurological:     General: No focal deficit present.     Mental Status: She is alert and oriented to person, place, and time.     Cranial Nerves: No cranial nerve deficit.     Sensory: No sensory deficit.     Motor: No weakness.     Coordination: Coordination normal.  Psychiatric:        Mood and Affect: Mood is anxious. Affect is labile.        Speech: Speech is rapid and pressured and slurred.        Behavior: Behavior is agitated and hyperactive. Behavior is cooperative.        Thought Content: Thought content does not include homicidal or suicidal ideation.    ED Results / Procedures / Treatments   Labs (all labs ordered are listed, but only abnormal results are displayed) Labs Reviewed  COMPREHENSIVE METABOLIC PANEL - Abnormal; Notable for the following components:      Result Value   Sodium 132 (*)    Calcium 8.0 (*)    AST 93 (*)    All other components within normal limits  ETHANOL - Abnormal; Notable for the following components:   Alcohol, Ethyl (B) 231 (*)    All other components within normal limits  RAPID URINE DRUG SCREEN, HOSP PERFORMED - Abnormal; Notable for the following components:   Cocaine POSITIVE (*)    All other components within normal limits  CBC WITH DIFFERENTIAL/PLATELET - Abnormal; Notable for the following components:   Hemoglobin 7.4 (*)    HCT 26.2 (*)    MCV 64.7 (*)    MCH 18.3 (*)    MCHC 28.2 (*)    RDW 19.9 (*)    All other components within normal limits  MAGNESIUM - Abnormal; Notable for the following components:   Magnesium 2.5 (*)    All other components within normal limits  URINALYSIS, ROUTINE W REFLEX MICROSCOPIC - Abnormal; Notable for the following components:   Color, Urine COLORLESS (*)    Specific Gravity, Urine 1.001 (*)    All other components within normal limits    EKG EKG Interpretation  Date/Time:  Tuesday Nov 12 2021 11:45:25 EDT Ventricular Rate:  84 PR  Interval:  132 QRS Duration: 99 QT Interval:  374 QTC Calculation: 443 R Axis:   60 Text Interpretation: Sinus rhythm Biatrial enlargement Confirmed by Godfrey Pick ([id number]) on 11/12/2021 11:59:25 AM  Radiology No results found.  Procedures Procedures    Medications Ordered in ED Medications  lactated ringers bolus 1,000 mL (1,000 mLs Intravenous Not Given 11/12/21 1227)    ED Course/ Medical Decision Making/ A&P                           Medical Decision Making Amount and/or Complexity of Data Reviewed Labs: ordered.   This patient presents to the ED for concern of altered mental status, this involves an extensive number of treatment options, and is a complaint that carries with it a high risk of complications and morbidity.  The differential diagnosis includes drug intoxication, alcohol intoxication   Co morbidities that complicate the patient evaluation  COPD, atrial fibrillation, HTN, T2DM, schizoaffective disorder, polysubstance abuse, chronic pain, anxiety,  depression, GERD, HLD, hypothyroidism   Additional history obtained:  Additional history obtained from law enforcement External records from outside source obtained and reviewed including EMR   Lab Tests:  I Ordered, and personally interpreted labs.  The pertinent results include: Elevated ethanol level, baseline microcytic anemia, UDS positive for cocaine   Cardiac Monitoring: / EKG:  The patient was maintained on a cardiac monitor.  I personally viewed and interpreted the cardiac monitored which showed an underlying rhythm of: Sinus rhythm   Consultations Obtained:  I requested consultation with the behavioral health,  and discussed lab and imaging findings as well as pertinent plan - they recommend: (Pending)   Problem List / ED Course / Critical interventions / Medication management  Patient is a 56 year old female with history of polysubstance abuse, presenting for agitation and altered mental status.   Police responded to multiple 911 calls, 1 of which was from her brother, for the patient's agitated behavior.  When they arrived on scene, patient was in the middle of a busy street.  She endorsed suicidal ideation.  She was aggressive and agitated.  She was brought to the emergency department.  On arrival, patient is awake and alert.  She currently denies suicidal ideation.  She does report drinking several 40 ounce beers today.  She denies cocaine use today.  She denies any physical complaints.  She does have history of COPD, however, lungs are clear to auscultation.  She does have a cough, which I assume is chronic.  Vital signs are normal.  She has no external evidence of trauma.  Diagnostic work-up was initiated for medical clearance.  Law enforcement provided IVC paperwork.  First look exam paperwork was completed and submitted to unit secretary.  Lab work shows ethanol level of 231.  UDS is positive for cocaine.  I suspect the patient's current condition is secondary to polysubstance abuse.  Patient to remain in the ED for reassessment when clinically sober. I ordered medication including IV fluids for hydration, patient declined Reevaluation of the patient after these medicines showed that the patient stayed the same   Social Determinants of Health:  History of polysubstance abuse and psychiatric illness   Test / Admission - Considered:  Considered head imaging, however, patient denies any recent trauma and has no external evidence of traumatic injuries.          Final Clinical Impression(s) / ED Diagnoses Final diagnoses:  Polysubstance abuse ([place])  Acute alcoholic intoxication without complication ([place])  Suicidal ideation    Rx / DC Orders ED Discharge Orders     None         Godfrey Pick, MD 11/12/21 [phone]

## 2021-11-12 NOTE — ED Triage Notes (Signed)
Per Police, patient was found dancing in the middle of the street. When police arrived she told them she had cancer and wanted to kill herself. She admits to alcohol consumption.

## 2021-11-12 NOTE — Consult Note (Cosign Needed Addendum)
[facility] ED ASSESSMENT   Reason for Consult:  Psychiatry Evaluation Referring Physician:  ER Physician Patient Identification: [name] MRN:  [id number] ED Chief Complaint: Alcohol intoxication ([place])  Diagnosis:  Principal Problem:   Alcohol intoxication ([place]) Active Problems:   Schizoaffective disorder, bipolar type ([place])   Substance induced mood disorder [facility])   ED Assessment Time Calculation: Start Time: 5361 Stop Time: 1829 Total Time in Minutes (Assessment Completion): 36   Subjective:   [name] is a 56 y.o. female patient admitted with Hx of Schizoaffective disorder, Bipolar type, Cocaine dependence, Alcohol dependence  was picked up and brought in by .the Police after she was found dancing in the middle of the street.  She told Police when they arrived to help her that she suffers from Cancer and wanted to die.  She was intoxicated at the time.  HPI:  AA 56 y.o. female patient admitted with Hx of Schizoaffective disorder, Bipolar type, Cocaine dependence, Alcohol dependence  was picked up and brought in by .the Police after she was found dancing in the middle of the street.  She told Police when they arrived to help her that she suffers from Cancer and wanted to die.  She was intoxicated at the time. UDS on arrival  is Cocaine positive, Alcohol level 231, AST-93 and concerning CBC result noted..  This provider attempted to evaluate patient but she was very drowsy, could not keep eyes open.  She however reported that she uses Cocaine daily and drinks Beer daily.  She could not quantify her drinking or use of Cocaine.  Per Nursing Note she refused IVF on arrival to the ER. Call to MR [facility]) did not yield much information.  He reported that he is out of town, stated that patient drinks and uses Cocaine daily.  He offered a phone number for patient's daughter who is a Biochemist, clinical at [employer] [phone]) who did not answer call and message was left  for her to call back.  Patient will need inpatient detox treatment and stabilization.  Patient reported in between sleep that she has not been taking any Psychotropic in unknown amount of time.  She did not remember any of her medications and who her Provider is. Past Psychiatric History: - Previous dx's of schizophrenia, bipolar, Depression, Anxiety, Polysubstance abuse with Multiple inpatient stays (at least 3-4) [facility] around December 2018. And last was 07/2018 at [facility] to Self or Others: Is the patient at risk to self? No Has the patient been a risk to self in the past 6 months? Yes Has the patient been a risk to self within the distant past? No Is the patient a risk to others? No Has the patient been a risk to others in the past 6 months? No Has the patient been a risk to others within the distant past? No  [place] Scale:  [place] [facility] ED from 11/12/2021 in [place] DEPT ED from 09/05/2021 in [place] ED from 07/26/2021 in [place] DEPT  C-SSRS RISK CATEGORY Low Risk Low Risk Low Risk       AIMS:  , , ,  ,   ASAM:    Substance Abuse:     Past Medical History:  Past Medical History:  Diagnosis Date   Abdominal pain    Accidental drug overdose April 2013   Anxiety    Atrial fibrillation ([place]) 09/29/11   converted spontaneously  Chronic back pain    Chronic knee pain    Chronic nausea    Chronic pain    COPD (chronic obstructive pulmonary disease) ([place])    Depression    Diabetes mellitus    states her doctor took her off all DM meds in past month   Diabetic neuropathy ([place])    Dyspnea    with exertion    GERD (gastroesophageal reflux disease)    Headache(784.0)    migraines    HTN (hypertension)    not on meds since in a year    Hyperlipidemia    Hypothyroidism    not on meds in a while    Mental disorder    Bipolar and schizophrenic   Requires  supplemental oxygen    as needed per patient    Schizophrenia ([place])    Schizophrenia, acute ([place]) 11/13/2017   Tobacco abuse     Past Surgical History:  Procedure Laterality Date   ABDOMINAL HYSTERECTOMY     BLADDER SUSPENSION  03/04/2011   Procedure: [facility] PROCEDURE;  Surgeon: Elayne Snare MacDiarmid;  Location: [facility] [facility];  Service: Urology;  Laterality: N/A;   CYSTOCELE REPAIR  03/04/2011   Procedure: ANTERIOR REPAIR (CYSTOCELE);  Surgeon: Reece Packer;  Location: [facility];  Service: Urology;  Laterality: N/A;   CYSTOSCOPY  03/04/2011   Procedure: CYSTOSCOPY;  Surgeon: Elayne Snare MacDiarmid;  Location: [facility];  Service: Urology;  Laterality: N/A;   ESOPHAGOGASTRODUODENOSCOPY (EGD) WITH PROPOFOL N/A 05/12/2017   Procedure: ESOPHAGOGASTRODUODENOSCOPY (EGD) WITH PROPOFOL;  Surgeon: Alphonsa Overall, MD;  Location: [facility];  Service: General;  Laterality: N/A;   GASTRIC ROUX-EN-Y N/A 03/25/2016   Procedure: LAPAROSCOPIC ROUX-EN-Y GASTRIC BYPASS WITH UPPER ENDOSCOPY;  Surgeon: Excell Seltzer, MD;  Location: [facility];  Service: General;  Laterality: N/A;   KNEE SURGERY     LAPAROSCOPIC ASSISTED VAGINAL HYSTERECTOMY  03/04/2011   Procedure: LAPAROSCOPIC ASSISTED VAGINAL HYSTERECTOMY;  Surgeon: Cyril Mourning, MD;  Location: [facility];  Service: Gynecology;  Laterality: N/A;   Family History:  Family History  Problem Relation Age of Onset   Heart attack Father        28s   Diabetes Mother    Heart disease Mother    Hypertension Mother    Heart attack Sister        85   COPD Other    Breast cancer Neg Hx    Family Psychiatric  History: Mother, maternal cousin and maternal aunt with depression. No family hx of suicide attempt/completion Social History:  Social History   Substance and Sexual Activity  Alcohol Use Yes   Comment: daily     Social History   Substance and Sexual Activity  Drug Use Yes   Types: Cocaine, Marijuana, "Crack" cocaine    Social History   Socioeconomic History    Marital status: Legally Separated    Spouse name: Not on file   Number of children: Not on file   Years of education: Not on file   Highest education level: Not on file  Occupational History   Not on file  Tobacco Use   Smoking status: Every Day    Packs/day: 0.50    Types: Cigarettes   Smokeless tobacco: Never  Vaping Use   Vaping Use: Never used  Substance and Sexual Activity   Alcohol use: Yes    Comment: daily   Drug use: Yes    Types: Cocaine, Marijuana, "Crack" cocaine   Sexual activity: Yes    Partners:  Male  Other Topics Concern   Not on file  Social History Narrative   Not on file   Social Determinants of Health   Financial Resource Strain: Not on file  Food Insecurity: Not on file  Transportation Needs: Not on file  Physical Activity: Not on file  Stress: Not on file  Social Connections: Not on file   Additional Social History:    Allergies:   Allergies  Allergen Reactions   Morphine And Related Nausea And Vomiting and Other (See Comments)    Pt stated she is not allergic to medication   Aspirin Nausea And Vomiting and Other (See Comments)    Ok to take tylenol or ibuprofen     Labs:  Results for orders placed or performed during the hospital encounter of 11/12/21 (from the past 48 hour(s))  Urine rapid drug screen (hosp performed)     Status: Abnormal   Collection Time: 11/12/21 11:39 AM  Result Value Ref Range   Opiates NONE DETECTED NONE DETECTED   Cocaine POSITIVE (A) NONE DETECTED   Benzodiazepines NONE DETECTED NONE DETECTED   Amphetamines NONE DETECTED NONE DETECTED   Tetrahydrocannabinol NONE DETECTED NONE DETECTED   Barbiturates NONE DETECTED NONE DETECTED    Comment: (NOTE) DRUG SCREEN FOR MEDICAL PURPOSES ONLY.  IF CONFIRMATION IS NEEDED FOR ANY PURPOSE, NOTIFY LAB WITHIN 5 DAYS.  LOWEST DETECTABLE LIMITS FOR URINE DRUG SCREEN Drug Class                     Cutoff (ng/mL) Amphetamine and metabolites    1000 Barbiturate and  metabolites    200 Benzodiazepine                 546 Tricyclics and metabolites     300 Opiates and metabolites        300 Cocaine and metabolites        300 THC                            50 Performed at [facility], [place] [address] [place], [place] [address]   Urinalysis, Routine w reflex microscopic     Status: Abnormal   Collection Time: 11/12/21 11:39 AM  Result Value Ref Range   Color, Urine COLORLESS (A) YELLOW   APPearance CLEAR CLEAR   Specific Gravity, Urine 1.001 (L) 1.005 - 1.030   pH 5.0 5.0 - 8.0   Glucose, UA NEGATIVE NEGATIVE mg/dL   Hgb urine dipstick NEGATIVE NEGATIVE   Bilirubin Urine NEGATIVE NEGATIVE   Ketones, ur NEGATIVE NEGATIVE mg/dL   Protein, ur NEGATIVE NEGATIVE mg/dL   Nitrite NEGATIVE NEGATIVE   Leukocytes,Ua NEGATIVE NEGATIVE    Comment: Performed at [place] [address], [place], [place] [address]  Comprehensive metabolic panel     Status: Abnormal   Collection Time: 11/12/21 11:43 AM  Result Value Ref Range   Sodium 132 (L) 135 - 145 mmol/L   Potassium 3.7 3.5 - 5.1 mmol/L   Chloride 100 98 - 111 mmol/L   CO2 27 22 - 32 mmol/L   Glucose, Bld 94 70 - 99 mg/dL    Comment: Glucose reference range applies only to samples taken after fasting for at least 8 hours.   BUN 9 6 - 20 mg/dL   Creatinine, Ser 0.58 0.44 - 1.00 mg/dL   Calcium 8.0 (L) 8.9 - 10.3 mg/dL   Total Protein 7.0 6.5 -  8.1 g/dL   Albumin 3.7 3.5 - 5.0 g/dL   AST 93 (H) 15 - 41 U/L   ALT 34 0 - 44 U/L   Alkaline Phosphatase 91 38 - 126 U/L   Total Bilirubin 0.6 0.3 - 1.2 mg/dL   GFR, Estimated >60 >60 mL/min    Comment: (NOTE) Calculated using the CKD-EPI Creatinine Equation (2021)    Anion gap 5 5 - 15    Comment: Performed at [facility], [place] [address], [place], [place] [address]  Ethanol     Status: Abnormal   Collection Time: 11/12/21 11:43 AM  Result Value Ref Range   Alcohol, Ethyl (B) 231 (H) <10  mg/dL    Comment: (NOTE) Lowest detectable limit for serum alcohol is 10 mg/dL.  For medical purposes only. Performed at [facility], [place] [address] [place], [place] [address]   CBC with Diff     Status: Abnormal   Collection Time: 11/12/21 11:43 AM  Result Value Ref Range   WBC 6.0 4.0 - 10.5 K/uL   RBC 4.05 3.87 - 5.11 MIL/uL   Hemoglobin 7.4 (L) 12.0 - 15.0 g/dL    Comment: Reticulocyte Hemoglobin testing may be clinically indicated, consider ordering this additional test [id number]    HCT 26.2 (L) 36.0 - 46.0 %   MCV 64.7 (L) 80.0 - 100.0 fL   MCH 18.3 (L) 26.0 - 34.0 pg   MCHC 28.2 (L) 30.0 - 36.0 g/dL   RDW 19.9 (H) 11.5 - 15.5 %   Platelets 306 150 - 400 K/uL   nRBC 0.0 0.0 - 0.2 %   Neutrophils Relative % 57 %   Neutro Abs 3.4 1.7 - 7.7 K/uL   Lymphocytes Relative 33 %   Lymphs Abs 2.0 0.7 - 4.0 K/uL   Monocytes Relative 8 %   Monocytes Absolute 0.5 0.1 - 1.0 K/uL   Eosinophils Relative 1 %   Eosinophils Absolute 0.1 0.0 - 0.5 K/uL   Basophils Relative 1 %   Basophils Absolute 0.0 0.0 - 0.1 K/uL   Immature Granulocytes 0 %   Abs Immature Granulocytes 0.01 0.00 - 0.07 K/uL    Comment: Performed at [facility], [place] [address] [place], [place] [address]  Magnesium     Status: Abnormal   Collection Time: 11/12/21 11:43 AM  Result Value Ref Range   Magnesium 2.5 (H) 1.7 - 2.4 mg/dL    Comment: Performed at [facility], [place] [address], [place], [place] [address]    Current Facility-Administered Medications  Medication Dose Route Frequency Provider Last Rate Last Admin   lactated ringers bolus 1,000 mL  1,000 mL Intravenous Once Godfrey Pick, MD       Current Outpatient Medications  Medication Sig Dispense Refill   metFORMIN (GLUCOPHAGE) 500 MG tablet Take 1 tablet (500 mg total) by mouth 2 (two) times daily with a meal. 60 tablet 0   ABILIFY MAINTENA 400 MG PRSY prefilled syringe Inject 400 mg as  directed every 28 (twenty-eight) days.     albuterol (PROAIR HFA) 108 (90 Base) MCG/ACT inhaler Inhale 2 puffs into the lungs every 6 (six) hours as needed for wheezing or shortness of breath.     ALPRAZolam (XANAX) 1 MG tablet Take 1 tablet (1 mg total) by mouth 2 (two) times daily as needed for anxiety. (Patient taking differently: Take 1 mg by mouth in the morning, at noon, and at bedtime.) 30 tablet 0   blood  glucose meter kit and supplies KIT Dispense based on patient and insurance preference. Use up to four times daily as directed. (FOR ICD-9 250.00, 250.01). 1 each 0   CAPLYTA 42 MG capsule Take 42 mg by mouth at bedtime.     Cholecalciferol 25 MCG (1000 UT) capsule Take 1 capsule (1,000 Units total) by mouth daily. 30 capsule 0   ferrous sulfate 325 (65 FE) MG tablet Take 1 tablet (325 mg total) by mouth daily with breakfast. 30 tablet 0   guaiFENesin (MUCINEX) 600 MG 12 hr tablet Take 1 tablet (600 mg total) by mouth 2 (two) times daily.     ibuprofen (ADVIL) 800 MG tablet Take 800 mg by mouth 3 (three) times daily as needed for pain.     insulin aspart (NOVOLOG) 100 UNIT/ML FlexPen Insulin sliding scale: Blood sugar  120-150   3units                       151-200   4units                       201-250   7units                       251- 300  11units                       301-350   15uints                       351-400   20units                       >400         call MD immediately (Patient not taking: Reported on 11/12/2021) 15 mL 0   Insulin Pen Needle 29G X 12MM MISC novolog sliding scale insulin three times a day as needed 100 each 0   levocetirizine (XYZAL) 5 MG tablet Take 5 mg by mouth daily.     Multiple Vitamin (MULTIVITAMIN WITH MINERALS) TABS tablet Take 1 tablet by mouth 2 (two) times daily.     nicotine (NICODERM CQ - DOSED IN MG/24 HOURS) 21 mg/24hr patch Place 1 patch (21 mg total) onto the skin daily. (May purchase from over the counter): For nicotine withdrawal symptoms  1 patch 0   SUMAtriptan 6 MG/0.5ML SOAJ Inject 6 mg into the muscle daily as needed for migraine.     SYMBICORT 160-4.5 MCG/ACT inhaler Inhale 2 puffs into the lungs daily as needed (sob and wheezing). 1 Inhaler 0   tiZANidine (ZANAFLEX) 4 MG tablet Take 4 mg by mouth at bedtime as needed for muscle spasms.      Musculoskeletal: Strength & Muscle Tone: within normal limits Gait & Station: normal Patient leans: Front   Psychiatric Specialty Exam: Presentation  General Appearance: Appropriate for Environment; Casual  Eye Contact:Fair  Speech:Garbled (Drowsy and  could not engage much any  onversation.)  Speech Volume:Decreased  Handedness:Right   Mood and Affect  Mood:Anxious; Depressed; Labile; Irritable; Angry  Affect:Congruent; Depressed   Thought Process  Thought Processes:Coherent  Descriptions of Associations:Intact  Orientation:Partial  Thought Content:Logical; Illogical  History of Schizophrenia/Schizoaffective disorder:No data recorded Duration of Psychotic Symptoms:No data recorded Hallucinations:Hallucinations: None  Ideas of Reference:None  Suicidal Thoughts:Suicidal Thoughts: No  Homicidal Thoughts:Homicidal Thoughts: No   Sensorium  Memory:-- (to drowsy to participate much)  Judgment:Fair  Insight:Poor   Executive Functions  Concentration:Poor  Attention Span:Poor  [place]  Language:Poor   Psychomotor Activity  Psychomotor Activity:Psychomotor Activity: Decreased   Assets  Assets:Resilience; Physical Health; Housing    Sleep  Sleep:Sleep: Fair   Physical Exam:  Unable to participate in assessment due to Alcohol intoxication. Physical Exam ROS Blood pressure (!) 125/59, pulse 89, temperature 98.5 F (36.9 C), temperature source Oral, resp. rate 18, last menstrual period 01/08/2011, SpO2 100 %. There is no height or weight on file to calculate BMI.  Medical Decision Making: Monitor, Meets  criteria for inpatient hospitalization for safe Alcohol Detox treatment.    Patient did not engage in evaluation.  Call to daughter was not answered and message left was not returned.  We will place patient on CIWA Protocol using  Ativan.  Problem 1: Alcohol intoxication  Problem 2: Schizoaffective disorder, Bipolar type  Problem 3: Cocaine Dependence  Disposition:  Meets criteria for inpatient Hospitalization.Delfin Gant, NP-PMHNP-BC 11/12/2021 6:32 PM

## 2021-11-12 NOTE — Consult Note (Signed)
  Patient's daughter returned call and reported that her mother relapsed on Alcohol and Cocaine two years ago after many years of sobriety.  She also stated that her mother does not tell her much about herself and so does not know if her mom has Cancer.  MS [name] also reported that her mother need to be monitored closely for withdrawal seizure because one time she had seizures which  their family thought was from Cocaine but I told her it may be Alcohol withdrawal seizures.  She reported that her mother spends a lot of her SS check on Substances and needs help.  She reported that her mother lost her previous Psychiatrist and that her mother did not find another one.  She reported that patient had an ACT Team in the past and was receiving LAI antipsychotic.  She is not sure when she had the inject last.  As stated patient needs inpatient Psychiatric hospitalization although the ER Physician need to address her abnormal CBC results Call lasted 15 minutes.Marland Kitchen

## 2021-11-12 NOTE — ED Notes (Signed)
Security and additional staff needed to change patient into wine scrubs.

## 2021-11-12 NOTE — ED Notes (Signed)
RN attempted to place IV to give fluids. Patient told RN not put anything in her or give her anything. RN attempted to persuade the patient, but the patient still refused.

## 2021-11-12 NOTE — ED Notes (Signed)
1 bag of patient belongings were placed at the 1-4 nurses station.

## 2021-11-13 ENCOUNTER — Emergency Department (HOSPITAL_COMMUNITY): Payer: Medicare Other

## 2021-11-13 ENCOUNTER — Encounter (HOSPITAL_COMMUNITY): Payer: Self-pay

## 2021-11-13 ENCOUNTER — Other Ambulatory Visit: Payer: Self-pay

## 2021-11-13 ENCOUNTER — Inpatient Hospital Stay (HOSPITAL_COMMUNITY)
Admission: EM | Admit: 2021-11-13 | Discharge: 2021-11-16 | DRG: 177 | Disposition: A | Discharge status: Home / Self Care | Payer: Medicare Other | Attending: Internal Medicine | Admitting: Internal Medicine

## 2021-11-13 DIAGNOSIS — F259 Schizoaffective disorder, unspecified: Secondary | ICD-10-CM | POA: Diagnosis present

## 2021-11-13 DIAGNOSIS — R45851 Suicidal ideations: Secondary | ICD-10-CM | POA: Diagnosis present

## 2021-11-13 DIAGNOSIS — Z79899 Other long term (current) drug therapy: Secondary | ICD-10-CM

## 2021-11-13 DIAGNOSIS — K219 Gastro-esophageal reflux disease without esophagitis: Secondary | ICD-10-CM | POA: Diagnosis present

## 2021-11-13 DIAGNOSIS — F10129 Alcohol abuse with intoxication, unspecified: Secondary | ICD-10-CM | POA: Diagnosis present

## 2021-11-13 DIAGNOSIS — J449 Chronic obstructive pulmonary disease, unspecified: Secondary | ICD-10-CM | POA: Diagnosis present

## 2021-11-13 DIAGNOSIS — F17210 Nicotine dependence, cigarettes, uncomplicated: Secondary | ICD-10-CM | POA: Diagnosis present

## 2021-11-13 DIAGNOSIS — E1142 Type 2 diabetes mellitus with diabetic polyneuropathy: Secondary | ICD-10-CM

## 2021-11-13 DIAGNOSIS — F1924 Other psychoactive substance dependence with psychoactive substance-induced mood disorder: Secondary | ICD-10-CM | POA: Diagnosis present

## 2021-11-13 DIAGNOSIS — J441 Chronic obstructive pulmonary disease with (acute) exacerbation: Secondary | ICD-10-CM | POA: Diagnosis present

## 2021-11-13 DIAGNOSIS — E119 Type 2 diabetes mellitus without complications: Secondary | ICD-10-CM

## 2021-11-13 DIAGNOSIS — F419 Anxiety disorder, unspecified: Secondary | ICD-10-CM | POA: Diagnosis present

## 2021-11-13 DIAGNOSIS — D509 Iron deficiency anemia, unspecified: Secondary | ICD-10-CM | POA: Diagnosis present

## 2021-11-13 DIAGNOSIS — F250 Schizoaffective disorder, bipolar type: Secondary | ICD-10-CM | POA: Diagnosis present

## 2021-11-13 DIAGNOSIS — D649 Anemia, unspecified: Secondary | ICD-10-CM

## 2021-11-13 DIAGNOSIS — R451 Restlessness and agitation: Secondary | ICD-10-CM | POA: Diagnosis not present

## 2021-11-13 DIAGNOSIS — E039 Hypothyroidism, unspecified: Secondary | ICD-10-CM | POA: Diagnosis present

## 2021-11-13 DIAGNOSIS — J9601 Acute respiratory failure with hypoxia: Secondary | ICD-10-CM | POA: Diagnosis present

## 2021-11-13 DIAGNOSIS — F1910 Other psychoactive substance abuse, uncomplicated: Secondary | ICD-10-CM | POA: Diagnosis present

## 2021-11-13 DIAGNOSIS — F1990 Other psychoactive substance use, unspecified, uncomplicated: Secondary | ICD-10-CM

## 2021-11-13 DIAGNOSIS — Z20822 Contact with and (suspected) exposure to covid-19: Secondary | ICD-10-CM | POA: Diagnosis present

## 2021-11-13 DIAGNOSIS — R569 Unspecified convulsions: Principal | ICD-10-CM | POA: Diagnosis present

## 2021-11-13 DIAGNOSIS — Z716 Tobacco abuse counseling: Secondary | ICD-10-CM

## 2021-11-13 DIAGNOSIS — Y907 Blood alcohol level of 200-239 mg/100 ml: Secondary | ICD-10-CM | POA: Diagnosis present

## 2021-11-13 DIAGNOSIS — Z7984 Long term (current) use of oral hypoglycemic drugs: Secondary | ICD-10-CM

## 2021-11-13 DIAGNOSIS — Z886 Allergy status to analgesic agent status: Secondary | ICD-10-CM

## 2021-11-13 DIAGNOSIS — Z9884 Bariatric surgery status: Secondary | ICD-10-CM

## 2021-11-13 DIAGNOSIS — Z794 Long term (current) use of insulin: Secondary | ICD-10-CM

## 2021-11-13 DIAGNOSIS — Z7951 Long term (current) use of inhaled steroids: Secondary | ICD-10-CM

## 2021-11-13 DIAGNOSIS — Z885 Allergy status to narcotic agent status: Secondary | ICD-10-CM

## 2021-11-13 DIAGNOSIS — G8929 Other chronic pain: Secondary | ICD-10-CM | POA: Diagnosis present

## 2021-11-13 DIAGNOSIS — E785 Hyperlipidemia, unspecified: Secondary | ICD-10-CM | POA: Diagnosis present

## 2021-11-13 DIAGNOSIS — I10 Essential (primary) hypertension: Secondary | ICD-10-CM | POA: Diagnosis present

## 2021-11-13 DIAGNOSIS — Z90710 Acquired absence of both cervix and uterus: Secondary | ICD-10-CM

## 2021-11-13 DIAGNOSIS — J690 Pneumonitis due to inhalation of food and vomit: Principal | ICD-10-CM | POA: Diagnosis present

## 2021-11-13 DIAGNOSIS — F32A Depression, unspecified: Secondary | ICD-10-CM | POA: Diagnosis present

## 2021-11-13 LAB — URINALYSIS, ROUTINE W REFLEX MICROSCOPIC
Bilirubin Urine: NEGATIVE
Glucose, UA: 150 mg/dL — AB
Hgb urine dipstick: NEGATIVE
Ketones, ur: NEGATIVE mg/dL
Leukocytes,Ua: NEGATIVE
Nitrite: NEGATIVE
Protein, ur: NEGATIVE mg/dL
Specific Gravity, Urine: 1.008 (ref 1.005–1.030)
pH: 6 (ref 5.0–8.0)

## 2021-11-13 LAB — COMPREHENSIVE METABOLIC PANEL
ALT: 27 U/L (ref 0–44)
AST: 29 U/L (ref 15–41)
Albumin: 3.8 g/dL (ref 3.5–5.0)
Alkaline Phosphatase: 95 U/L (ref 38–126)
Anion gap: 8 (ref 5–15)
BUN: 10 mg/dL (ref 6–20)
CO2: 28 mmol/L (ref 22–32)
Calcium: 8.9 mg/dL (ref 8.9–10.3)
Chloride: 99 mmol/L (ref 98–111)
Creatinine, Ser: 0.54 mg/dL (ref 0.44–1.00)
GFR, Estimated: >60 mL/min (ref >60)
Glucose, Bld: 143 mg/dL — ABNORMAL HIGH (ref 70–99)
Potassium: 4.2 mmol/L (ref 3.5–5.1)
Sodium: 135 mmol/L (ref 135–145)
Total Bilirubin: 1 mg/dL (ref 0.3–1.2)
Total Protein: 7.2 g/dL (ref 6.5–8.1)

## 2021-11-13 LAB — RAPID URINE DRUG SCREEN, HOSP PERFORMED
Amphetamines: NOT DETECTED
Barbiturates: NOT DETECTED
Benzodiazepines: POSITIVE — AB
Cocaine: POSITIVE — AB
Opiates: NOT DETECTED
Tetrahydrocannabinol: NOT DETECTED

## 2021-11-13 LAB — CBC WITH DIFFERENTIAL/PLATELET
Abs Immature Granulocytes: 0 10*3/uL (ref 0.00–0.07)
Basophils Absolute: 0 10*3/uL (ref 0.0–0.1)
Basophils Relative: 0 %
Eosinophils Absolute: 0 10*3/uL (ref 0.0–0.5)
Eosinophils Relative: 0 %
HCT: 33 % — ABNORMAL LOW (ref 36.0–46.0)
Hemoglobin: 8.7 g/dL — ABNORMAL LOW (ref 12.0–15.0)
Lymphocytes Relative: 5 %
Lymphs Abs: 0.5 10*3/uL — ABNORMAL LOW (ref 0.7–4.0)
MCH: 17.4 pg — ABNORMAL LOW (ref 26.0–34.0)
MCHC: 26.4 g/dL — ABNORMAL LOW (ref 30.0–36.0)
MCV: 66.1 fL — ABNORMAL LOW (ref 80.0–100.0)
Monocytes Absolute: 0.1 10*3/uL (ref 0.1–1.0)
Monocytes Relative: 1 %
Neutro Abs: 9.9 10*3/uL — ABNORMAL HIGH (ref 1.7–7.7)
Neutrophils Relative %: 94 %
Platelets: 327 10*3/uL (ref 150–400)
RBC: 4.99 MIL/uL (ref 3.87–5.11)
RDW: 20.1 % — ABNORMAL HIGH (ref 11.5–15.5)
WBC: 10.5 10*3/uL (ref 4.0–10.5)
nRBC: 0 % (ref 0.0–0.2)
nRBC: 0 /100 WBC

## 2021-11-13 LAB — AMMONIA: Ammonia: 27 umol/L (ref 9–35)

## 2021-11-13 LAB — I-STAT BETA HCG BLOOD, ED (MC, WL, AP ONLY): I-stat hCG, quantitative: <5 m[IU]/mL (ref <5)

## 2021-11-13 LAB — CK: Total CK: 192 U/L (ref 38–234)

## 2021-11-13 LAB — SALICYLATE LEVEL: Salicylate Lvl: <7 mg/dL — ABNORMAL LOW (ref 7.0–30.0)

## 2021-11-13 LAB — ACETAMINOPHEN LEVEL: Acetaminophen (Tylenol), Serum: <10 ug/mL — ABNORMAL LOW (ref 10–30)

## 2021-11-13 LAB — ETHANOL: Alcohol, Ethyl (B): <10 mg/dL (ref <10)

## 2021-11-13 MED ORDER — IPRATROPIUM-ALBUTEROL 0.5-2.5 (3) MG/3ML IN SOLN
3.0000 mL | Freq: Once | RESPIRATORY_TRACT | Status: AC
  Administered 2021-11-13: 3 mL via RESPIRATORY_TRACT
  Filled 2021-11-13: qty 3

## 2021-11-13 MED ORDER — SODIUM CHLORIDE 0.9 % IV BOLUS
1000.0000 mL | Freq: Once | INTRAVENOUS | Status: AC
Start: 2021-11-13 — End: 2021-11-13
  Administered 2021-11-13: 1000 mL via INTRAVENOUS

## 2021-11-13 MED ORDER — METHYLPREDNISOLONE SODIUM SUCC 125 MG IJ SOLR
125.0000 mg | Freq: Once | INTRAMUSCULAR | Status: AC
  Administered 2021-11-13: 125 mg via INTRAVENOUS
  Filled 2021-11-13: qty 2

## 2021-11-13 NOTE — ED Provider Notes (Signed)
I provided a substantive portion of the care of this patient.  I personally performed the entirety of the medical decision making for this encounter.  EKG Interpretation  Date/Time:  Wednesday Nov 13 2021 17:00:39 EDT Ventricular Rate:  95 PR Interval:  123 QRS Duration: 91 QT Interval:  350 QTC Calculation: 440 R Axis:   52 Text Interpretation: Sinus rhythm LAE, consider biatrial enlargement Confirmed by Lacretia Leigh ([id number]) on 11/13/2021 5:28:35 PM Patient presents after having seizure activity prior to being here.  Patient reportedly used cocaine and alcohol and has history of seizures due to that.  EMS did treat the patient with Versed.  She is resting comfortably at this time.  Work appears reassuring.  Will likely discharge home when patient awake   Lacretia Leigh, MD 11/13/21 2114

## 2021-11-13 NOTE — Discharge Instructions (Signed)
Follow up with the recommendations from the behavioral health team

## 2021-11-13 NOTE — ED Triage Notes (Signed)
Pt BIBA with reports of seizure activity at home after drug and alcohol use. Pt with witnessed seizure with EMS. '5mg'$  Midazolam given IM.

## 2021-11-13 NOTE — Consult Note (Signed)
[facility] Face-to-Face Psychiatry Consult   Reason for Consult: Concerns related to substance abuse. Referring Physician: ED provider Patient Identification: [name] MRN:  [id number] Principal Diagnosis: Alcohol intoxication ([name]) Diagnosis:  Principal Problem:   Alcohol intoxication ([name]) Active Problems:   Schizoaffective disorder, bipolar type ([name])   Substance induced mood disorder ([name])   Total Time spent with patient: 15 minutes  Subjective:   [name] is a 56 y.o. female patient admitted with Substance abuse concerns. Patient was seen and evaluated face to face. Pt awake, alert and oriented x3. Discussed additional outpatient substance abuse resources. Pt declines resources at this time. Chart reviewed UDS positive for cocaine, alcohol level 168 on admission.. Pt denies suicidal and homicidal ideations. Pt denies auditory and visual hallucinations. Case staffed with MD Dwyane Dee, Pt to be cleared by psychiatry and encouraged to follow up with all outpatient resources.  HPI:  [name] 56 y.o. female patient admitted with Hx of Schizoaffective disorder, Bipolar type, Cocaine dependence, Alcohol dependence  was picked up and brought in by .the Police after she was found dancing in the middle of the street.  She told Police when they arrived to help her that she suffers from Cancer and wanted to die.  She was intoxicated at the time. UDS on arrival  is Cocaine positive, Alcohol level 231, AST-93 and concerning CBC result noted..  This provider attempted to evaluate patient but she was very drowsy, could not keep eyes open.  She however reported that she uses Cocaine daily and drinks Beer daily.  She could not quantify her drinking or use of Cocaine.  Per Nursing Note she refused IVF on arrival to the ER.  Risk to Self:   Risk to Others:   Prior Inpatient Therapy:   Prior Outpatient Therapy:    Past Medical History:  Past Medical History:  Diagnosis Date   Abdominal pain    Accidental  drug overdose April 2013   Anxiety    Atrial fibrillation ([place]) 09/29/11   converted spontaneously   Chronic back pain    Chronic knee pain    Chronic nausea    Chronic pain    COPD (chronic obstructive pulmonary disease) ([place])    Depression    Diabetes mellitus    states her doctor took her off all DM meds in past month   Diabetic neuropathy ([place])    Dyspnea    with exertion    GERD (gastroesophageal reflux disease)    Headache(784.0)    migraines    HTN (hypertension)    not on meds since in a year    Hyperlipidemia    Hypothyroidism    not on meds in a while    Mental disorder    Bipolar and schizophrenic   Requires supplemental oxygen    as needed per patient    Schizophrenia ([place])    Schizophrenia, acute ([place]) 11/13/2017   Tobacco abuse     Past Surgical History:  Procedure Laterality Date   ABDOMINAL HYSTERECTOMY     BLADDER SUSPENSION  03/04/2011   Procedure: [facility] PROCEDURE;  Surgeon: Elayne Snare MacDiarmid;  Location: [facility];  Service: Urology;  Laterality: N/A;   CYSTOCELE REPAIR  03/04/2011   Procedure: ANTERIOR REPAIR (CYSTOCELE);  Surgeon: Reece Packer;  Location: [facility];  Service: Urology;  Laterality: N/A;   CYSTOSCOPY  03/04/2011   Procedure: CYSTOSCOPY;  Surgeon: Elayne Snare MacDiarmid;  Location: [facility];  Service: Urology;  Laterality: N/A;   ESOPHAGOGASTRODUODENOSCOPY (EGD) WITH  PROPOFOL N/A 05/12/2017   Procedure: ESOPHAGOGASTRODUODENOSCOPY (EGD) WITH PROPOFOL;  Surgeon: Alphonsa Overall, MD;  Location: [facility];  Service: General;  Laterality: N/A;   GASTRIC ROUX-EN-Y N/A 03/25/2016   Procedure: LAPAROSCOPIC ROUX-EN-Y GASTRIC BYPASS WITH UPPER ENDOSCOPY;  Surgeon: Excell Seltzer, MD;  Location: [facility];  Service: General;  Laterality: N/A;   KNEE SURGERY     LAPAROSCOPIC ASSISTED VAGINAL HYSTERECTOMY  03/04/2011   Procedure: LAPAROSCOPIC ASSISTED VAGINAL HYSTERECTOMY;  Surgeon: Cyril Mourning, MD;  Location: [facility];  Service: Gynecology;  Laterality:  N/A;   Family History:  Family History  Problem Relation Age of Onset   Heart attack Father        20s   Diabetes Mother    Heart disease Mother    Hypertension Mother    Heart attack Sister        28   COPD Other    Breast cancer Neg Hx    Family Psychiatric  History: See Chart Social History:  Social History   Substance and Sexual Activity  Alcohol Use Yes   Comment: daily     Social History   Substance and Sexual Activity  Drug Use Yes   Types: Cocaine, Marijuana, "Crack" cocaine    Social History   Socioeconomic History   Marital status: Legally Separated    Spouse name: Not on file   Number of children: Not on file   Years of education: Not on file   Highest education level: Not on file  Occupational History   Not on file  Tobacco Use   Smoking status: Every Day    Packs/day: 0.50    Types: Cigarettes   Smokeless tobacco: Never  Vaping Use   Vaping Use: Never used  Substance and Sexual Activity   Alcohol use: Yes    Comment: daily   Drug use: Yes    Types: Cocaine, Marijuana, "Crack" cocaine   Sexual activity: Yes    Partners: Male  Other Topics Concern   Not on file  Social History Narrative   Not on file   Social Determinants of Health   Financial Resource Strain: Not on file  Food Insecurity: Not on file  Transportation Needs: Not on file  Physical Activity: Not on file  Stress: Not on file  Social Connections: Not on file   Additional Social History:    Allergies:   Allergies  Allergen Reactions   Aspirin Nausea And Vomiting and Other (See Comments)    Ok to take tylenol or ibuprofen     Labs:  Results for orders placed or performed during the hospital encounter of 11/12/21 (from the past 48 hour(s))  Urine rapid drug screen (hosp performed)     Status: Abnormal   Collection Time: 11/12/21 11:39 AM  Result Value Ref Range   Opiates NONE DETECTED NONE DETECTED   Cocaine POSITIVE (A) NONE DETECTED   Benzodiazepines NONE  DETECTED NONE DETECTED   Amphetamines NONE DETECTED NONE DETECTED   Tetrahydrocannabinol NONE DETECTED NONE DETECTED   Barbiturates NONE DETECTED NONE DETECTED    Comment: (NOTE) DRUG SCREEN FOR MEDICAL PURPOSES ONLY.  IF CONFIRMATION IS NEEDED FOR ANY PURPOSE, NOTIFY LAB WITHIN 5 DAYS.  LOWEST DETECTABLE LIMITS FOR URINE DRUG SCREEN Drug Class                     Cutoff (ng/mL) Amphetamine and metabolites    1000 Barbiturate and metabolites    200 Benzodiazepine  038 Tricyclics and metabolites     300 Opiates and metabolites        300 Cocaine and metabolites        300 THC                            50 Performed at [facility], [place] [address] [place], [place] [address]   Urinalysis, Routine w reflex microscopic     Status: Abnormal   Collection Time: 11/12/21 11:39 AM  Result Value Ref Range   Color, Urine COLORLESS (A) YELLOW   APPearance CLEAR CLEAR   Specific Gravity, Urine 1.001 (L) 1.005 - 1.030   pH 5.0 5.0 - 8.0   Glucose, UA NEGATIVE NEGATIVE mg/dL   Hgb urine dipstick NEGATIVE NEGATIVE   Bilirubin Urine NEGATIVE NEGATIVE   Ketones, ur NEGATIVE NEGATIVE mg/dL   Protein, ur NEGATIVE NEGATIVE mg/dL   Nitrite NEGATIVE NEGATIVE   Leukocytes,Ua NEGATIVE NEGATIVE    Comment: Performed at [facility] [address], [place], [place] [address]  Comprehensive metabolic panel     Status: Abnormal   Collection Time: 11/12/21 11:43 AM  Result Value Ref Range   Sodium 132 (L) 135 - 145 mmol/L   Potassium 3.7 3.5 - 5.1 mmol/L   Chloride 100 98 - 111 mmol/L   CO2 27 22 - 32 mmol/L   Glucose, Bld 94 70 - 99 mg/dL    Comment: Glucose reference range applies only to samples taken after fasting for at least 8 hours.   BUN 9 6 - 20 mg/dL   Creatinine, Ser 0.58 0.44 - 1.00 mg/dL   Calcium 8.0 (L) 8.9 - 10.3 mg/dL   Total Protein 7.0 6.5 - 8.1 g/dL   Albumin 3.7 3.5 - 5.0 g/dL   AST 93 (H) 15 - 41 U/L   ALT 34 0 - 44  U/L   Alkaline Phosphatase 91 38 - 126 U/L   Total Bilirubin 0.6 0.3 - 1.2 mg/dL   GFR, Estimated >60 >60 mL/min    Comment: (NOTE) Calculated using the CKD-EPI Creatinine Equation (2021)    Anion gap 5 5 - 15    Comment: Performed at [facility], [place] [address] [place], [place] [address]  Ethanol     Status: Abnormal   Collection Time: 11/12/21 11:43 AM  Result Value Ref Range   Alcohol, Ethyl (B) 231 (H) <10 mg/dL    Comment: (NOTE) Lowest detectable limit for serum alcohol is 10 mg/dL.  For medical purposes only. Performed at [facility], [place] [address], [place], [place] [address]   CBC with Diff     Status: Abnormal   Collection Time: 11/12/21 11:43 AM  Result Value Ref Range   WBC 6.0 4.0 - 10.5 K/uL   RBC 4.05 3.87 - 5.11 MIL/uL   Hemoglobin 7.4 (L) 12.0 - 15.0 g/dL    Comment: Reticulocyte Hemoglobin testing may be clinically indicated, consider ordering this additional test [id number]    HCT 26.2 (L) 36.0 - 46.0 %   MCV 64.7 (L) 80.0 - 100.0 fL   MCH 18.3 (L) 26.0 - 34.0 pg   MCHC 28.2 (L) 30.0 - 36.0 g/dL   RDW 19.9 (H) 11.5 - 15.5 %   Platelets 306 150 - 400 K/uL   nRBC 0.0 0.0 - 0.2 %   Neutrophils Relative % 57 %   Neutro Abs 3.4 1.7 - 7.7 K/uL  Lymphocytes Relative 33 %   Lymphs Abs 2.0 0.7 - 4.0 K/uL   Monocytes Relative 8 %   Monocytes Absolute 0.5 0.1 - 1.0 K/uL   Eosinophils Relative 1 %   Eosinophils Absolute 0.1 0.0 - 0.5 K/uL   Basophils Relative 1 %   Basophils Absolute 0.0 0.0 - 0.1 K/uL   Immature Granulocytes 0 %   Abs Immature Granulocytes 0.01 0.00 - 0.07 K/uL    Comment: Performed at [facility], [place] [address] [place], [place] [address]  Magnesium     Status: Abnormal   Collection Time: 11/12/21 11:43 AM  Result Value Ref Range   Magnesium 2.5 (H) 1.7 - 2.4 mg/dL    Comment: Performed at [facility], [place] [address], [place], [place] [address]     Current Facility-Administered Medications  Medication Dose Route Frequency Provider Last Rate Last Admin   lactated ringers bolus 1,000 mL  1,000 mL Intravenous Once Godfrey Pick, MD       LORazepam (ATIVAN) tablet 1 mg  1 mg Oral Q6H PRN Delfin Gant, NP       Current Outpatient Medications  Medication Sig Dispense Refill   albuterol (PROAIR HFA) 108 (90 Base) MCG/ACT inhaler Inhale 2 puffs into the lungs every 6 (six) hours as needed for wheezing or shortness of breath.     ALPRAZolam (XANAX) 1 MG tablet Take 1 tablet (1 mg total) by mouth 2 (two) times daily as needed for anxiety. (Patient taking differently: Take 1 mg by mouth in the morning, at noon, and at bedtime.) 30 tablet 0   ibuprofen (ADVIL) 800 MG tablet Take 800 mg by mouth 3 (three) times daily as needed for pain.     SUMAtriptan 6 MG/0.5ML SOAJ Inject 6 mg into the muscle daily as needed for migraine.     ABILIFY MAINTENA 400 MG PRSY prefilled syringe Inject 400 mg as directed every 28 (twenty-eight) days. (Patient not taking: Reported on 11/13/2021)     blood glucose meter kit and supplies KIT Dispense based on patient and insurance preference. Use up to four times daily as directed. (FOR ICD-9 250.00, 250.01). 1 each 0   CAPLYTA 42 MG capsule Take 42 mg by mouth at bedtime. (Patient not taking: Reported on 11/13/2021)     Cholecalciferol 25 MCG (1000 UT) capsule Take 1 capsule (1,000 Units total) by mouth daily. (Patient not taking: Reported on 11/13/2021) 30 capsule 0   ferrous sulfate 325 (65 FE) MG tablet Take 1 tablet (325 mg total) by mouth daily with breakfast. (Patient not taking: Reported on 11/13/2021) 30 tablet 0   guaiFENesin (MUCINEX) 600 MG 12 hr tablet Take 1 tablet (600 mg total) by mouth 2 (two) times daily. (Patient not taking: Reported on 11/13/2021)     insulin aspart (NOVOLOG) 100 UNIT/ML FlexPen Insulin sliding scale: Blood sugar  120-150   3units                       151-200   4units                        201-250   7units                       251- 300  11units                       301-350  15uints                       351-400   20units                       >400         call MD immediately (Patient not taking: Reported on 11/12/2021) 15 mL 0   Insulin Pen Needle 29G X 12MM MISC novolog sliding scale insulin three times a day as needed 100 each 0   levocetirizine (XYZAL) 5 MG tablet Take 5 mg by mouth daily. (Patient not taking: Reported on 11/13/2021)     metFORMIN (GLUCOPHAGE) 500 MG tablet Take 1 tablet (500 mg total) by mouth 2 (two) times daily with a meal. (Patient not taking: Reported on 11/13/2021) 60 tablet 0   Multiple Vitamin (MULTIVITAMIN WITH MINERALS) TABS tablet Take 1 tablet by mouth 2 (two) times daily. (Patient not taking: Reported on 11/13/2021)     nicotine (NICODERM CQ - DOSED IN MG/24 HOURS) 21 mg/24hr patch Place 1 patch (21 mg total) onto the skin daily. (May purchase from over the counter): For nicotine withdrawal symptoms (Patient not taking: Reported on 11/13/2021) 1 patch 0   SYMBICORT 160-4.5 MCG/ACT inhaler Inhale 2 puffs into the lungs daily as needed (sob and wheezing). (Patient not taking: Reported on 11/13/2021) 1 Inhaler 0   tiZANidine (ZANAFLEX) 4 MG tablet Take 4 mg by mouth at bedtime as needed for muscle spasms. (Patient not taking: Reported on 11/13/2021)      Musculoskeletal: Strength & Muscle Tone: within normal limits Gait & Station: normal Patient leans: N/A            Psychiatric Specialty Exam:  Presentation  General Appearance: Appropriate for Environment  Eye Contact:Fair  Speech:Coherent Volume:Normal  Handedness:Right   Mood and Affect  Mood:Euthymic  Affect:Congruent   Thought Process  Thought Processes:Coherent  Descriptions of Associations:Intact  Orientation:Full (Time, Place and Person)  Thought Content:Logical  History of Schizophrenia/Schizoaffective disorder:No data recorded Duration of Psychotic  Symptoms:No data recorded Hallucinations:Hallucinations: None  Ideas of Reference:None  Suicidal Thoughts:Suicidal Thoughts: No  Homicidal Thoughts:Homicidal Thoughts: No   Sensorium  Memory:Immediate Fair  Judgment:Fair  Insight:Fair   Executive Functions  Concentration:Fair  Attention Span:Fair  [place]   Psychomotor Activity  Psychomotor Activity:Psychomotor Activity: Restlessness   Assets  Assets:Social Support; Financial Resources/Insurance   Sleep  Sleep:Sleep: Fair   Physical Exam: Physical Exam ROS Blood pressure (!) 146/74, pulse 85, temperature 98.3 F (36.8 C), temperature source Oral, resp. rate 18, last menstrual period 01/08/2011, SpO2 100 %. There is no height or weight on file to calculate BMI.  Treatment Plan Summary: Pt to be discharge with substance abuse resources and consider follow up with psychiatry services.   Disposition: No evidence of imminent risk to self or others at present.   Patient does not meet criteria for psychiatric inpatient admission. Supportive therapy provided about ongoing stressors. Refer to IOP. Discussed crisis plan, support from social network, calling 911, coming to the Emergency Department, and calling Suicide Hotline.  Randon Goldsmith, NP 11/13/2021 12:56 PM

## 2021-11-13 NOTE — ED Notes (Signed)
Pt was sleeping and O2 sat dropped to 78%. 2L applied and pt stimulated. Pts O2 now 97%.

## 2021-11-13 NOTE — Progress Notes (Signed)
CSW requested [facility] Wynonia Hazard, RN to review for [facility]. CSW will assist follow.   Benjaman Kindler, MSW, [facility] 11/13/2021 12:57 AM

## 2021-11-13 NOTE — ED Provider Notes (Signed)
[facility] EMERGENCY DEPARTMENT Provider Note   CSN: [id number] Arrival date & time: 11/13/21  1654     History  Chief Complaint  Patient presents with   Seizures    [name] is a 56 y.o. female history of EtOH, polysubstance use, schizophrenia here for evaluation of seizure like activity.  Patient discharged from hospital approximately 5 hours PTA.  According to bystanders at home patient started drinking alcohol and using cocaine after discharge.  She had witnessed seizure with EMS, given 5 mg of Versed.  Patient arrival she is somnolent.  No known traumatic injuries per EMS.  Cleared by psychiatry 12 PM today  Level 5 caveat- AMS  HPI     Home Medications Prior to Admission medications   Medication Sig Start Date End Date Taking? Authorizing Provider  ABILIFY MAINTENA 400 MG PRSY prefilled syringe Inject 400 mg as directed every 28 (twenty-eight) days. Patient not taking: Reported on 11/13/2021 07/12/18   [provider]  albuterol (PROAIR HFA) 108 (90 Base) MCG/ACT inhaler Inhale 2 puffs into the lungs every 6 (six) hours as needed for wheezing or shortness of breath. 11/16/17   Lindell Spar I, NP  ALPRAZolam Duanne Moron) 1 MG tablet Take 1 tablet (1 mg total) by mouth 2 (two) times daily as needed for anxiety. Patient taking differently: Take 1 mg by mouth in the morning, at noon, and at bedtime. 07/27/20   Florencia Reasons, MD  blood glucose meter kit and supplies KIT Dispense based on patient and insurance preference. Use up to four times daily as directed. (FOR ICD-9 250.00, 250.01). 07/27/20   Florencia Reasons, MD  CAPLYTA 42 MG capsule Take 42 mg by mouth at bedtime. Patient not taking: Reported on 11/13/2021 06/12/21   [provider]  Cholecalciferol 25 MCG (1000 UT) capsule Take 1 capsule (1,000 Units total) by mouth daily. Patient not taking: Reported on 11/13/2021 08/03/20   Florencia Reasons, MD  ferrous sulfate 325 (65 FE) MG tablet Take 1 tablet (325 mg  total) by mouth daily with breakfast. Patient not taking: Reported on 11/13/2021 07/28/20   Florencia Reasons, MD  guaiFENesin (MUCINEX) 600 MG 12 hr tablet Take 1 tablet (600 mg total) by mouth 2 (two) times daily. Patient not taking: Reported on 11/13/2021 07/27/20   Florencia Reasons, MD  ibuprofen (ADVIL) 800 MG tablet Take 800 mg by mouth 3 (three) times daily as needed for pain. 07/03/20   [provider]  insulin aspart (NOVOLOG) 100 UNIT/ML FlexPen Insulin sliding scale: Blood sugar  120-150   3units                       151-200   4units                       201-250   7units                       251- 300  11units                       301-350   15uints                       351-400   20units                       >400  call MD immediately Patient not taking: Reported on 11/12/2021 07/27/20   Florencia Reasons, MD  Insulin Pen Needle 29G X 12MM MISC novolog sliding scale insulin three times a day as needed 07/27/20   Florencia Reasons, MD  levocetirizine (XYZAL) 5 MG tablet Take 5 mg by mouth daily. Patient not taking: Reported on 11/13/2021 09/07/21   [provider]  metFORMIN (GLUCOPHAGE) 500 MG tablet Take 1 tablet (500 mg total) by mouth 2 (two) times daily with a meal. Patient not taking: Reported on 11/13/2021 07/27/20 12/28/21  Florencia Reasons, MD  Multiple Vitamin (MULTIVITAMIN WITH MINERALS) TABS tablet Take 1 tablet by mouth 2 (two) times daily. Patient not taking: Reported on 11/13/2021 07/27/20   Florencia Reasons, MD  nicotine (NICODERM CQ - DOSED IN MG/24 HOURS) 21 mg/24hr patch Place 1 patch (21 mg total) onto the skin daily. (May purchase from over the counter): For nicotine withdrawal symptoms Patient not taking: Reported on 11/13/2021 11/17/17   Lindell Spar I, NP  SUMAtriptan 6 MG/0.5ML SOAJ Inject 6 mg into the muscle daily as needed for migraine. 11/01/21   [provider]  SYMBICORT 160-4.5 MCG/ACT inhaler Inhale 2 puffs into the lungs daily as needed (sob and wheezing). Patient not taking: Reported  on 11/13/2021 11/16/17   Lindell Spar I, NP  tiZANidine (ZANAFLEX) 4 MG tablet Take 4 mg by mouth at bedtime as needed for muscle spasms. Patient not taking: Reported on 11/13/2021 07/11/21   [provider]      Allergies    Aspirin    Review of Systems   Review of Systems  Constitutional: Negative.   HENT: Negative.    Respiratory: Negative.    Cardiovascular: Negative.   Gastrointestinal: Negative.   Genitourinary: Negative.   Musculoskeletal: Negative.   Skin: Negative.   Neurological:  Positive for seizures.  All other systems reviewed and are negative.  Physical Exam Updated Vital Signs BP (!) 144/82   Pulse 95   Temp 99.2 F (37.3 C) (Oral)   Resp (!) 21   Ht 5' (1.524 m)   Wt 54.4 kg   LMP 01/08/2011   SpO2 98%   BMI 23.44 kg/m  Physical Exam Vitals and nursing note reviewed.  Constitutional:      General: She is not in acute distress.    Appearance: She is well-developed. She is not ill-appearing.  HENT:     Head: Normocephalic and atraumatic.     Jaw: There is normal jaw occlusion.     Nose: Nose normal.     Mouth/Throat:     Mouth: Mucous membranes are dry.     Comments: Dry mucous membranes, no tongue injury Eyes:     Pupils: Pupils are equal, round, and reactive to light.     Comments: Pupils 11m bil reactive  Neck:     Comments: Range of neck without difficulty Cardiovascular:     Rate and Rhythm: Normal rate.     Pulses: Normal pulses.          Radial pulses are 2+ on the right side and 2+ on the left side.       Dorsalis pedis pulses are 2+ on the left side.     Heart sounds: Normal heart sounds.  Pulmonary:     Effort: Pulmonary effort is normal. No respiratory distress.     Breath sounds: Normal breath sounds and air entry. No decreased breath sounds or wheezing.  Abdominal:     General: Bowel sounds are normal. There  is no distension.     Palpations: Abdomen is soft.     Tenderness: There is no abdominal tenderness. There is no  guarding or rebound.     Comments: Soft non tender  Musculoskeletal:        General: Normal range of motion.     Cervical back: Normal range of motion.     Comments: Non tender, Spontaneous moves extremities  Skin:    General: Skin is warm and dry.     Capillary Refill: Capillary refill takes less than 2 seconds.     Comments: No obvious rash and lesions  Neurological:     General: No focal deficit present.     Mental Status: She is alert.     Comments: Postictal Withdraws to pain Spontaneously moves extremities  Psychiatric:        Mood and Affect: Mood normal.    ED Results / Procedures / Treatments   Labs (all labs ordered are listed, but only abnormal results are displayed) Labs Reviewed  COMPREHENSIVE METABOLIC PANEL - Abnormal; Notable for the following components:      Result Value   Glucose, Bld 143 (*)    All other components within normal limits  RAPID URINE DRUG SCREEN, HOSP PERFORMED - Abnormal; Notable for the following components:   Cocaine POSITIVE (*)    Benzodiazepines POSITIVE (*)    All other components within normal limits  CBC WITH DIFFERENTIAL/PLATELET - Abnormal; Notable for the following components:   Hemoglobin 8.7 (*)    HCT 33.0 (*)    MCV 66.1 (*)    MCH 17.4 (*)    MCHC 26.4 (*)    RDW 20.1 (*)    Neutro Abs 9.9 (*)    Lymphs Abs 0.5 (*)    All other components within normal limits  SALICYLATE LEVEL - Abnormal; Notable for the following components:   Salicylate Lvl <8.3 (*)    All other components within normal limits  ACETAMINOPHEN LEVEL - Abnormal; Notable for the following components:   Acetaminophen (Tylenol), Serum <10 (*)    All other components within normal limits  URINALYSIS, ROUTINE W REFLEX MICROSCOPIC - Abnormal; Notable for the following components:   Color, Urine STRAW (*)    Glucose, UA 150 (*)    All other components within normal limits  ETHANOL  CK  AMMONIA  I-STAT BETA HCG BLOOD, ED ([facility], [facility], [facility] ONLY)  CBG  MONITORING, ED    EKG EKG Interpretation  Date/Time:  Wednesday Nov 13 2021 17:00:39 EDT Ventricular Rate:  95 PR Interval:  123 QRS Duration: 91 QT Interval:  350 QTC Calculation: 440 R Axis:   52 Text Interpretation: Sinus rhythm LAE, consider biatrial enlargement Confirmed by Lacretia Leigh ([id number]) on 11/13/2021 5:28:35 PM  Radiology DG Chest 1 View  Result Date: 11/13/2021 CLINICAL DATA:  Seizure EXAM: CHEST  1 VIEW COMPARISON:  11/13/2021 FINDINGS: The heart size and mediastinal contours are within normal limits. Both lungs are clear. The visualized skeletal structures are unremarkable. Skin fold artifacts over left chest. IMPRESSION: No active disease. Electronically Signed   By: Donavan Foil M.D.   On: 11/13/2021 17:48   DG Chest 2 View  Result Date: 11/13/2021 CLINICAL DATA:  Wheezing, vomiting, possible aspiration EXAM: CHEST - 2 VIEW COMPARISON:  11/13/2021 at 1800 hours FINDINGS: Lungs are clear.  No pleural effusion or pneumothorax. The heart is normal in size. Mild superior endplate compression fracture deformity of a midthoracic vertebral body, unchanged. IMPRESSION: No evidence of  acute cardiopulmonary disease. Mild superior endplate compression fracture deformity of a mid thoracic vertebral body, unchanged. Electronically Signed   By: Julian Hy M.D.   On: 11/13/2021 23:22   DG Chest 2 View  Result Date: 11/13/2021 CLINICAL DATA:  Shortness of breath. EXAM: CHEST - 2 VIEW COMPARISON:  None Available. FINDINGS: The heart size and mediastinal contours are within normal limits. Ill-defined nodular appearing opacities are seen overlying the bilateral lung bases which may represent bilateral nipple shadows. Mildly increased suprahilar lung markings are noted, bilaterally. There is no evidence of a pleural effusion or pneumothorax. The visualized skeletal structures are unremarkable. IMPRESSION: 1. Findings suggestive of mild viral bronchitis versus mild reactive airway  disease. 2. Additional findings which may represent bilateral nipple shadows, correlation with two view chest x-ray with nipple markers is recommended. Electronically Signed   By: Virgina Norfolk M.D.   On: 11/13/2021 01:41   CT HEAD WO CONTRAST (5MM)  Result Date: 11/13/2021 CLINICAL DATA:  Seizure activity after drug and alcohol use EXAM: CT HEAD WITHOUT CONTRAST TECHNIQUE: Contiguous axial images were obtained from the base of the skull through the vertex without intravenous contrast. RADIATION DOSE REDUCTION: This exam was performed according to the departmental dose-optimization program which includes automated exposure control, adjustment of the mA and/or kV according to patient size and/or use of iterative reconstruction technique. COMPARISON:  07/26/2021 FINDINGS: Brain: No evidence of acute infarction, hemorrhage, cerebral edema, mass, mass effect, or midline shift. No hydrocephalus or extra-axial fluid collection. Vascular: No hyperdense vessel. Skull: Normal. Negative for fracture or focal lesion. Sinuses/Orbits: No acute finding. Other: Fluid in the left mastoid air cells. IMPRESSION: No acute intracranial process. Electronically Signed   By: Merilyn Baba M.D.   On: 11/13/2021 19:02    Procedures Procedures    Medications Ordered in ED Medications  sodium chloride 0.9 % bolus 1,000 mL (0 mLs Intravenous Stopped 11/13/21 1911)  methylPREDNISolone sodium succinate (SOLU-MEDROL) 125 mg/2 mL injection 125 mg (125 mg Intravenous Given 11/13/21 2321)  ipratropium-albuterol (DUONEB) 0.5-2.5 (3) MG/3ML nebulizer solution 3 mL (3 mLs Nebulization Given 11/13/21 2323)   ED Course/ Medical Decision Making/ A&P    56 year old history of polysubstance use, EtOH use, schizophrenia here for evaluation of witnessed seizure.  Discharge from [facility] earlier today at 12 PM for SI after being cleared by psychiatry.  Apparently went home started drinking alcohol and using cocaine and had a witnessed  seizure.  She has history of similar due to her cocaine use.  She arrives postictal after being given Versed with EMS.  She has no obvious traumatic injuries.  Spontaneously moves extremities and withdraws to pain.  Protecting airway.  Plan on labs and imaging:   Labs and imaging personally viewed and interpreted:  CT head without acute abnormality DG chest without acute abnormality EKG without ischemic changes CBC no leukocytosis, hemoglobin 8.7 CMP glucose 143 CK 192 Ammonia 27 Preg neg Ethanol, Salicylate, Acetaminophen  Reassessed, sitting up in bed, ambulated to restroom without difficulty.  Tolerating p.o. intake.  I discussed her presentation here to the emergency department.  She denies any SI, HI, AVH.  States she did some cocaine after being discharged from the hospital.  Does states she drinks alcohol intermittently however denies any history of DTs or withdrawal seizures.  CONSULT with Neurology Dr. Curly Sh[place].  States if patient back to baseline can discharge home.  Does not need seizure medication.  Suspect provoked seizure due to her cocaine use.  Patient CIWA 0,  without tachycardia, tachypnea, hypertension  Patient reassessed.  Was noted to become hypoxic.  She was awake at that time.  Reassessed patient and she now has diffuse wheezing.  Question whether she had some form of aspiration however I do not see any form of emesis.  We will recheck chest x-ray, give breathing treatments, steroids.  Chart reviewed has a history of COPD.  Care transferred to [place], PA-C who will FU on reassessment and dispo.                           Medical Decision Making Amount and/or Complexity of Data Reviewed Independent Historian: EMS External Data Reviewed: labs, radiology, ECG and notes. Labs: ordered. Decision-making details documented in ED Course. Radiology: ordered and independent interpretation performed. Decision-making details documented in ED Course. ECG/medicine tests:  ordered and independent interpretation performed. Decision-making details documented in ED Course.  Risk OTC drugs. Prescription drug management. Parenteral controlled substances. Decision regarding hospitalization. Diagnosis or treatment significantly limited by social determinants of health.         Final Clinical Impression(s) / ED Diagnoses Final diagnoses:  Seizure-like activity ([place])  Polysubstance use disorder  COPD exacerbation ([place])  Anemia, unspecified type    Rx / DC Orders ED Discharge Orders     None         Charli Liberatore A, PA-C 11/13/21 2336    Lacretia Leigh, MD 11/14/21 [phone]

## 2021-11-13 NOTE — ED Notes (Signed)
Pt in room not responsive to staff laying in bed. Chest rising and falling. No current seizure activity.

## 2021-11-13 NOTE — ED Notes (Signed)
Pt in room rocking from side to side in bed for periods of 20 sec then stops to rest. Airway protected. No loss of bowel or bladder on arrival.

## 2021-11-13 NOTE — ED Notes (Signed)
Daughter Loma Sousa will be coming to pick up pt. Call [phone] when ready to go.

## 2021-11-13 NOTE — ED Notes (Signed)
Pt off unit to xray.

## 2021-11-13 NOTE — ED Notes (Signed)
[name] contacted for pick-up. Will call sister to see if she can come. Will call ER back with notification.

## 2021-11-13 NOTE — ED Notes (Signed)
Pt given sandwich and drink.

## 2021-11-13 NOTE — ED Notes (Signed)
Pt ambulatory to restroom with a steady gait. Following commands.

## 2021-11-13 NOTE — ED Notes (Signed)
Daughter just called back. Informed her of new orders. No one going to pick up pt until the am now. Provider notified. CN notified.

## 2021-11-13 NOTE — ED Provider Notes (Signed)
Care of patient handed off to me at this time from [place], [place]. Please see their note for full HPI, PE, workup. Briefly, this is a 56 year old female with PMH of schizoaffective disorder, COPD, hypertension, polysubstance abuse who presents to the emergency department for seizure.  Patient was discharged from the hospital today about 5 hours ago.  After discharge the patient began drinking alcohol and using cocaine.  She then had a witnessed seizure by EMS and was given Versed.  She presented here initially postictal.  She has since had a normal neurological exam.  Per the previous provider, patient was to initially be discharged and became hypoxic.  She then had diffuse wheezing.  Question of whether this is aspiration versus COPD.  She was given DuoNeb, steroids, placed on oxygen and repeated chest x-ray.   Physical Exam  BP (!) 144/82   Pulse 95   Temp 99.2 F (37.3 C) (Oral)   Resp (!) 21   Ht 5' (1.524 m)   Wt 54.4 kg   LMP 01/08/2011   SpO2 98%   BMI 23.44 kg/m   Physical Exam Vitals and nursing note reviewed.  Constitutional:      Appearance: She is ill-appearing.  HENT:     Head: Normocephalic and atraumatic.  Eyes:     General: No scleral icterus.    Extraocular Movements: Extraocular movements intact.  Cardiovascular:     Rate and Rhythm: Normal rate.     Pulses: Normal pulses.     Heart sounds: No murmur heard. Pulmonary:     Effort: Pulmonary effort is normal. No respiratory distress.     Breath sounds: Examination of the right-upper field reveals rhonchi. Rhonchi present.  Abdominal:     Palpations: Abdomen is soft.  Skin:    General: Skin is warm and dry.     Capillary Refill: Capillary refill takes less than 2 seconds.     Findings: No rash.  Neurological:     General: No focal deficit present.     Mental Status: She is alert.  Psychiatric:        Mood and Affect: Mood normal.        Behavior: Behavior normal.        Thought Content: Thought content normal.         Judgment: Judgment normal.    Procedures  Procedures  ED Course / MDM    Medical Decision Making Amount and/or Complexity of Data Reviewed Labs: ordered. Radiology: ordered.  Risk Prescription drug management. Decision regarding hospitalization.   0013: re-check - not currently wheezing. On 2L. 100%. Placed on room air. Will ambulate and re-evaluate 0030: Failed ambulation on room air. Desat to 46s. Was placed back on 4LNC.  31: Spoke with Dr. Bridgett Larsson who agrees to admit patient for overnight obs. He is requesting CTA PE study for hypoxia which I have placed.  56 year old female who presented to ED after witnessed seizure activity with EMS after cocaine use. She was to be discharged when she became hypoxic for previous shift. She was placed on O2 and received neb, steroids, repeat x-ray. She improved from wheezing stand point on my re-evaluation and doing well on [facility]. She was placed on room air and then attempted ambulatory O2 which she failed immediately. Question whether this is an aspiration pneumonia. She does have RUL rhonchi that would be consistent with this. CXR negative, but may not be presented on imaging yet. I spoke with hosptialist, Bridgett Larsson, MD who is requesting CTA PE study.  She does have tobacco history. But no recent immobilization, surgery, cancer. Order has been placed and she will be admitted for observation.      Mickie Hillier, PA-C 11/14/21 [id number]    [name], MD 11/14/21 0600

## 2021-11-14 ENCOUNTER — Observation Stay (HOSPITAL_COMMUNITY): Payer: Medicare Other

## 2021-11-14 ENCOUNTER — Encounter (HOSPITAL_COMMUNITY): Payer: Self-pay | Admitting: *Deleted

## 2021-11-14 DIAGNOSIS — D509 Iron deficiency anemia, unspecified: Secondary | ICD-10-CM

## 2021-11-14 DIAGNOSIS — F10129 Alcohol abuse with intoxication, unspecified: Secondary | ICD-10-CM | POA: Diagnosis present

## 2021-11-14 DIAGNOSIS — Z20822 Contact with and (suspected) exposure to covid-19: Secondary | ICD-10-CM | POA: Diagnosis present

## 2021-11-14 DIAGNOSIS — I10 Essential (primary) hypertension: Secondary | ICD-10-CM

## 2021-11-14 DIAGNOSIS — R45851 Suicidal ideations: Secondary | ICD-10-CM | POA: Diagnosis present

## 2021-11-14 DIAGNOSIS — Z7951 Long term (current) use of inhaled steroids: Secondary | ICD-10-CM | POA: Diagnosis not present

## 2021-11-14 DIAGNOSIS — Z716 Tobacco abuse counseling: Secondary | ICD-10-CM | POA: Diagnosis not present

## 2021-11-14 DIAGNOSIS — F1924 Other psychoactive substance dependence with psychoactive substance-induced mood disorder: Secondary | ICD-10-CM | POA: Diagnosis present

## 2021-11-14 DIAGNOSIS — R569 Unspecified convulsions: Secondary | ICD-10-CM

## 2021-11-14 DIAGNOSIS — Y907 Blood alcohol level of 200-239 mg/100 ml: Secondary | ICD-10-CM | POA: Diagnosis present

## 2021-11-14 DIAGNOSIS — E039 Hypothyroidism, unspecified: Secondary | ICD-10-CM | POA: Diagnosis present

## 2021-11-14 DIAGNOSIS — F17210 Nicotine dependence, cigarettes, uncomplicated: Secondary | ICD-10-CM | POA: Diagnosis present

## 2021-11-14 DIAGNOSIS — F32A Depression, unspecified: Secondary | ICD-10-CM | POA: Diagnosis present

## 2021-11-14 DIAGNOSIS — F1910 Other psychoactive substance abuse, uncomplicated: Secondary | ICD-10-CM

## 2021-11-14 DIAGNOSIS — J441 Chronic obstructive pulmonary disease with (acute) exacerbation: Secondary | ICD-10-CM | POA: Diagnosis present

## 2021-11-14 DIAGNOSIS — J9601 Acute respiratory failure with hypoxia: Secondary | ICD-10-CM | POA: Diagnosis present

## 2021-11-14 DIAGNOSIS — Z7984 Long term (current) use of oral hypoglycemic drugs: Secondary | ICD-10-CM | POA: Diagnosis not present

## 2021-11-14 DIAGNOSIS — J449 Chronic obstructive pulmonary disease, unspecified: Secondary | ICD-10-CM

## 2021-11-14 DIAGNOSIS — Z794 Long term (current) use of insulin: Secondary | ICD-10-CM | POA: Diagnosis not present

## 2021-11-14 DIAGNOSIS — J690 Pneumonitis due to inhalation of food and vomit: Secondary | ICD-10-CM | POA: Diagnosis present

## 2021-11-14 DIAGNOSIS — F250 Schizoaffective disorder, bipolar type: Secondary | ICD-10-CM

## 2021-11-14 DIAGNOSIS — F419 Anxiety disorder, unspecified: Secondary | ICD-10-CM | POA: Diagnosis present

## 2021-11-14 DIAGNOSIS — E785 Hyperlipidemia, unspecified: Secondary | ICD-10-CM | POA: Diagnosis present

## 2021-11-14 DIAGNOSIS — G8929 Other chronic pain: Secondary | ICD-10-CM | POA: Diagnosis present

## 2021-11-14 DIAGNOSIS — E1142 Type 2 diabetes mellitus with diabetic polyneuropathy: Secondary | ICD-10-CM | POA: Diagnosis present

## 2021-11-14 DIAGNOSIS — K219 Gastro-esophageal reflux disease without esophagitis: Secondary | ICD-10-CM | POA: Diagnosis present

## 2021-11-14 DIAGNOSIS — R451 Restlessness and agitation: Secondary | ICD-10-CM | POA: Diagnosis not present

## 2021-11-14 DIAGNOSIS — D649 Anemia, unspecified: Secondary | ICD-10-CM

## 2021-11-14 LAB — GLUCOSE, CAPILLARY
Glucose-Capillary: 209 mg/dL — ABNORMAL HIGH (ref 70–99)
Glucose-Capillary: 268 mg/dL — ABNORMAL HIGH (ref 70–99)
Glucose-Capillary: 304 mg/dL — ABNORMAL HIGH (ref 70–99)
Glucose-Capillary: 345 mg/dL — ABNORMAL HIGH (ref 70–99)
Glucose-Capillary: 455 mg/dL — ABNORMAL HIGH (ref 70–99)

## 2021-11-14 LAB — RESP PANEL BY RT-PCR (FLU A&B, COVID) ARPGX2
Influenza A by PCR: NEGATIVE
Influenza B by PCR: NEGATIVE
SARS Coronavirus 2 by RT PCR: NEGATIVE

## 2021-11-14 LAB — IRON AND TIBC
Iron: 12 ug/dL — ABNORMAL LOW (ref 28–170)
Saturation Ratios: 2 % — ABNORMAL LOW (ref 10.4–31.8)
TIBC: 585 ug/dL — ABNORMAL HIGH (ref 250–450)
UIBC: 573 ug/dL

## 2021-11-14 LAB — HEMOGLOBIN A1C
Hgb A1c MFr Bld: 7.4 % — ABNORMAL HIGH (ref 4.8–5.6)
Mean Plasma Glucose: 165.68 mg/dL

## 2021-11-14 LAB — BRAIN NATRIURETIC PEPTIDE: B Natriuretic Peptide: 36.8 pg/mL (ref 0.0–100.0)

## 2021-11-14 LAB — MAGNESIUM: Magnesium: 2 mg/dL (ref 1.7–2.4)

## 2021-11-14 LAB — C-REACTIVE PROTEIN: CRP: 0.5 mg/dL (ref <1.0)

## 2021-11-14 LAB — HIV ANTIBODY (ROUTINE TESTING W REFLEX): HIV Screen 4th Generation wRfx: NONREACTIVE

## 2021-11-14 LAB — PROCALCITONIN: Procalcitonin: <0.1 ng/mL

## 2021-11-14 LAB — MRSA NEXT GEN BY PCR, NASAL: MRSA by PCR Next Gen: NOT DETECTED

## 2021-11-14 MED ORDER — PREDNISONE 20 MG PO TABS
40.0000 mg | ORAL_TABLET | Freq: Every day | ORAL | Status: DC
  Administered 2021-11-14: 40 mg via ORAL
  Filled 2021-11-14: qty 2

## 2021-11-14 MED ORDER — LORAZEPAM 2 MG/ML IJ SOLN: 1.0000 mg | INTRAMUSCULAR | Status: DC | PRN

## 2021-11-14 MED ORDER — INSULIN ASPART 100 UNIT/ML IJ SOLN
10.0000 [IU] | Freq: Once | INTRAMUSCULAR | Status: AC
  Administered 2021-11-14: 10 [IU] via SUBCUTANEOUS

## 2021-11-14 MED ORDER — ACETAMINOPHEN 325 MG PO TABS: 650.0000 mg | ORAL_TABLET | Freq: Four times a day (QID) | ORAL | Status: DC | PRN

## 2021-11-14 MED ORDER — IPRATROPIUM-ALBUTEROL 0.5-2.5 (3) MG/3ML IN SOLN: 3.0000 mL | Freq: Four times a day (QID) | RESPIRATORY_TRACT | Status: DC | PRN

## 2021-11-14 MED ORDER — DIPHENHYDRAMINE HCL 50 MG/ML IJ SOLN
50.0000 mg | Freq: Once | INTRAMUSCULAR | Status: AC
  Administered 2021-11-14: 50 mg via INTRAVENOUS

## 2021-11-14 MED ORDER — DILTIAZEM HCL 60 MG PO TABS
60.0000 mg | ORAL_TABLET | Freq: Two times a day (BID) | ORAL | Status: DC
  Administered 2021-11-14 – 2021-11-16 (×5): 60 mg via ORAL
  Filled 2021-11-14 (×5): qty 1

## 2021-11-14 MED ORDER — SODIUM CHLORIDE 0.9 % IV SOLN
3.0000 g | Freq: Four times a day (QID) | INTRAVENOUS | Status: DC
  Administered 2021-11-14 – 2021-11-16 (×9): 3 g via INTRAVENOUS
  Filled 2021-11-14 (×9): qty 8

## 2021-11-14 MED ORDER — METHYLPREDNISOLONE SODIUM SUCC 125 MG IJ SOLR
INTRAMUSCULAR | Status: AC
  Filled 2021-11-14: qty 2

## 2021-11-14 MED ORDER — SODIUM CHLORIDE 0.9 % IV SOLN
25.0000 mg | Freq: Once | INTRAVENOUS | Status: AC
  Administered 2021-11-14: 25 mg via INTRAVENOUS
  Filled 2021-11-14: qty 0.5

## 2021-11-14 MED ORDER — NICOTINE 21 MG/24HR TD PT24
21.0000 mg | MEDICATED_PATCH | Freq: Every day | TRANSDERMAL | Status: DC
  Administered 2021-11-14 – 2021-11-16 (×3): 21 mg via TRANSDERMAL
  Filled 2021-11-14 (×3): qty 1

## 2021-11-14 MED ORDER — DIPHENHYDRAMINE HCL 50 MG/ML IJ SOLN
INTRAMUSCULAR | Status: AC
  Filled 2021-11-14: qty 1

## 2021-11-14 MED ORDER — INSULIN ASPART 100 UNIT/ML IJ SOLN
0.0000 [IU] | Freq: Every day | INTRAMUSCULAR | Status: DC
  Administered 2021-11-15: 2 [IU] via SUBCUTANEOUS

## 2021-11-14 MED ORDER — ONDANSETRON HCL 4 MG PO TABS: 4.0000 mg | ORAL_TABLET | Freq: Four times a day (QID) | ORAL | Status: DC | PRN

## 2021-11-14 MED ORDER — METHYLPREDNISOLONE SODIUM SUCC 125 MG IJ SOLR
60.0000 mg | Freq: Every day | INTRAMUSCULAR | Status: DC
Start: 2021-11-14 — End: 2021-11-16
  Administered 2021-11-14 – 2021-11-15 (×2): 60 mg via INTRAVENOUS
  Filled 2021-11-14: qty 2

## 2021-11-14 MED ORDER — CLONIDINE HCL 0.1 MG PO TABS: 0.1000 mg | ORAL_TABLET | ORAL | Status: DC | PRN

## 2021-11-14 MED ORDER — INSULIN ASPART 100 UNIT/ML IJ SOLN
0.0000 [IU] | Freq: Three times a day (TID) | INTRAMUSCULAR | Status: DC
  Administered 2021-11-14: 5 [IU] via SUBCUTANEOUS
  Administered 2021-11-14 (×2): 7 [IU] via SUBCUTANEOUS
  Administered 2021-11-15 (×2): 5 [IU] via SUBCUTANEOUS
  Administered 2021-11-15: 2 [IU] via SUBCUTANEOUS
  Administered 2021-11-16: 1 [IU] via SUBCUTANEOUS

## 2021-11-14 MED ORDER — LORAZEPAM 2 MG/ML IJ SOLN: 1.0000 mg | Freq: Once | INTRAMUSCULAR | Status: DC | PRN

## 2021-11-14 MED ORDER — METHYLPREDNISOLONE SODIUM SUCC 125 MG IJ SOLR
60.0000 mg | Freq: Every day | INTRAMUSCULAR | Status: DC
Start: 2021-11-15 — End: 2021-11-14

## 2021-11-14 MED ORDER — ONDANSETRON HCL 4 MG/2ML IJ SOLN: 4.0000 mg | Freq: Four times a day (QID) | INTRAMUSCULAR | Status: DC | PRN

## 2021-11-14 MED ORDER — FUROSEMIDE 10 MG/ML IJ SOLN
20.0000 mg | Freq: Once | INTRAMUSCULAR | Status: AC
  Administered 2021-11-14: 20 mg via INTRAVENOUS
  Filled 2021-11-14: qty 2

## 2021-11-14 MED ORDER — IOHEXOL 350 MG/ML SOLN
61.0000 mL | Freq: Once | INTRAVENOUS | Status: AC | PRN
  Administered 2021-11-14: 61 mL via INTRAVENOUS

## 2021-11-14 MED ORDER — LORAZEPAM 2 MG/ML IJ SOLN
1.0000 mg | INTRAMUSCULAR | Status: DC | PRN
  Administered 2021-11-14: 1 mg via INTRAVENOUS
  Filled 2021-11-14: qty 1

## 2021-11-14 MED ORDER — ACETAMINOPHEN 650 MG RE SUPP: 650.0000 mg | Freq: Four times a day (QID) | RECTAL | Status: DC | PRN

## 2021-11-14 MED ORDER — HEPARIN SODIUM (PORCINE) 5000 UNIT/ML IJ SOLN
5000.0000 [IU] | Freq: Three times a day (TID) | INTRAMUSCULAR | Status: DC
  Administered 2021-11-14 – 2021-11-16 (×6): 5000 [IU] via SUBCUTANEOUS
  Filled 2021-11-14 (×6): qty 1

## 2021-11-14 NOTE — Assessment & Plan Note (Signed)
Chronic. Recently seen by psych yesterday. See their consult note.

## 2021-11-14 NOTE — Assessment & Plan Note (Signed)
Stable. Was iron deficit last year. Unknown if pt has been taking iron or has had GI workup for anemia. Check iron and TIBC.

## 2021-11-14 NOTE — Progress Notes (Signed)
EEG complete - results pending 

## 2021-11-14 NOTE — Procedures (Signed)
Patient Name: [name]  MRN: [id number]  Epilepsy Attending: Lora Havens  Referring Physician/Provider:  Date: 11/14/2021 Duration: 23.27 mins  Patient history: 56 year old female with polysubstance abuse and seizure-like activity.  EEG to evaluate for seizure.  Level of alertness: Awake, asleep  AEDs during EEG study: None  Technical aspects: This EEG study was done with scalp electrodes positioned according to the 10-20 International system of electrode placement. Electrical activity was acquired at a sampling rate of '500Hz'$  and reviewed with a high frequency filter of '70Hz'$  and a low frequency filter of '1Hz'$ . EEG data were recorded continuously and digitally stored.   Description: The posterior dominant rhythm consists of 8-9 Hz activity of moderate voltage (25-35 uV) seen predominantly in posterior head regions, symmetric and reactive to eye opening and eye closing. Sleep was characterized by vertex waves, sleep spindles (12 to 14 Hz), maximal frontocentral region. EEG showed intermittent generalized 3 to 6 Hz theta-delta slowing. Hyperventilation and photic stimulation were not performed.     ABNORMALITY - Intermittent slow, generalized  IMPRESSION: This study is suggestive of mild diffuse encephalopathy, nonspecific etiology. No seizures or epileptiform discharges were seen throughout the recording.  [name] Barbra Sarks

## 2021-11-14 NOTE — Progress Notes (Signed)
   11/14/21 2138  Notify: Provider  Provider Name/Title Maretta Los  Date Provider Notified 11/14/21  Time Provider Notified 2138  Method of Notification  (secure chat)  Notification Reason Critical result (CBG 455)  Provider response See new orders  Date of Provider Response 11/14/21  Time of Provider Response 2140

## 2021-11-14 NOTE — H&P (Addendum)
History and Physical    [name] IWL:[id number] DOB: 07-26-1965 DOA: 11/13/2021  DOS: the patient was seen and examined on 11/13/2021  PCP: [name], MD   Patient coming from: Home  I have personally briefly reviewed patient's old medical records in [place]  CC: seizure-like activity HPI: 56 year old African-American female long history of polysubstance abuse including cocaine, alcohol, hypertension, type 2 diabetes who was recently released from psychiatry/behavioral health Hospital yesterday evening.  It looks like from the records, she was at behavioral health hospital from 11/12/2021 through 11/14/2018 for.  She had been seen by the psychiatric nurse practitioner.  During this hospitalization, she was brought in by the police because she was found dancing [address]  Apparently she was admitted to psychiatric hospital for detox.  Appears that patient was cleared by psychiatry for discharge.  According to triage notes, patient went home and started using cocaine and alcohol again.  EMS was called and patient was given 5 mg of IV Versed.  Her work-up in the ER was positive for UDS that was positive for cocaine and benzos.  Initially the plan was to discharge patient to home after she woke up however when the patient was ambulated, she was noted to become hypoxic.  She was wheezing according to the EDP and patient was given IV Solu-Medrol and DuoNebs.  Due to continued hypoxia while ambulating, [facility] contacted for admission.  Patient still somewhat sedated and unable to answer questions.   ED Course: Work-up negative except for UDS that was positive for cocaine and benzos.  Chest x-ray negative.  Patient continued to desat while ambulating.  Placed on supplemental oxygen.  Review of Systems:  Review of Systems  Unable to perform ROS: Mental acuity   Past Medical History:  Diagnosis Date   Abdominal pain    Accidental drug overdose April 2013    Anxiety    Atrial fibrillation ([place]) 09/29/11   converted spontaneously   Chronic back pain    Chronic knee pain    Chronic nausea    Chronic pain    COPD (chronic obstructive pulmonary disease) ([place])    Depression    Diabetes mellitus    states her doctor took her off all DM meds in past month   Diabetic neuropathy ([place])    Dyspnea    with exertion    GERD (gastroesophageal reflux disease)    Headache(784.0)    migraines    HTN (hypertension)    not on meds since in a year    Hyperlipidemia    Hypothyroidism    not on meds in a while    Mental disorder    Bipolar and schizophrenic   Requires supplemental oxygen    as needed per patient    Schizophrenia ([place])    Schizophrenia, acute ([place]) 11/13/2017   Tobacco abuse     Past Surgical History:  Procedure Laterality Date   ABDOMINAL HYSTERECTOMY     BLADDER SUSPENSION  03/04/2011   Procedure: [facility] PROCEDURE;  Surgeon: Elayne Snare MacDiarmid;  Location: [facility];  Service: Urology;  Laterality: N/A;   CYSTOCELE REPAIR  03/04/2011   Procedure: ANTERIOR REPAIR (CYSTOCELE);  Surgeon: Reece Packer;  Location: [facility];  Service: Urology;  Laterality: N/A;   CYSTOSCOPY  03/04/2011   Procedure: CYSTOSCOPY;  Surgeon: Elayne Snare MacDiarmid;  Location: [facility];  Service: Urology;  Laterality: N/A;   ESOPHAGOGASTRODUODENOSCOPY (EGD) WITH PROPOFOL N/A 05/12/2017   Procedure: ESOPHAGOGASTRODUODENOSCOPY (EGD) WITH PROPOFOL;  Surgeon: Alphonsa Overall, MD;  Location: [facility];  Service: General;  Laterality: N/A;   GASTRIC ROUX-EN-Y N/A 03/25/2016   Procedure: LAPAROSCOPIC ROUX-EN-Y GASTRIC BYPASS WITH UPPER ENDOSCOPY;  Surgeon: Excell Seltzer, MD;  Location: [facility];  Service: General;  Laterality: N/A;   KNEE SURGERY     LAPAROSCOPIC ASSISTED VAGINAL HYSTERECTOMY  03/04/2011   Procedure: LAPAROSCOPIC ASSISTED VAGINAL HYSTERECTOMY;  Surgeon: Cyril Mourning, MD;  Location: [facility];  Service: Gynecology;  Laterality: N/A;     reports that she  has been smoking cigarettes. She has been smoking an average of .5 packs per day. She has never used smokeless tobacco. She reports current alcohol use. She reports current drug use. Drugs: Cocaine, Marijuana, and "Crack" cocaine.  Allergies  Allergen Reactions   Aspirin Nausea And Vomiting and Other (See Comments)    Ok to take tylenol or ibuprofen     Family History  Problem Relation Age of Onset   Heart attack Father        18s   Diabetes Mother    Heart disease Mother    Hypertension Mother    Heart attack Sister        48   COPD Other    Breast cancer Neg Hx     Prior to Admission medications   Medication Sig Start Date End Date Taking? Authorizing Provider  ABILIFY MAINTENA 400 MG PRSY prefilled syringe Inject 400 mg as directed every 28 (twenty-eight) days. Patient not taking: Reported on 11/13/2021 07/12/18   [provider]  albuterol (PROAIR HFA) 108 (90 Base) MCG/ACT inhaler Inhale 2 puffs into the lungs every 6 (six) hours as needed for wheezing or shortness of breath. 11/16/17   Lindell Spar I, NP  ALPRAZolam Duanne Moron) 1 MG tablet Take 1 tablet (1 mg total) by mouth 2 (two) times daily as needed for anxiety. Patient taking differently: Take 1 mg by mouth in the morning, at noon, and at bedtime. 07/27/20   Florencia Reasons, MD  blood glucose meter kit and supplies KIT Dispense based on patient and insurance preference. Use up to four times daily as directed. (FOR ICD-9 250.00, 250.01). 07/27/20   Florencia Reasons, MD  CAPLYTA 42 MG capsule Take 42 mg by mouth at bedtime. Patient not taking: Reported on 11/13/2021 06/12/21   [provider]  Cholecalciferol 25 MCG (1000 UT) capsule Take 1 capsule (1,000 Units total) by mouth daily. Patient not taking: Reported on 11/13/2021 08/03/20   Florencia Reasons, MD  ferrous sulfate 325 (65 FE) MG tablet Take 1 tablet (325 mg total) by mouth daily with breakfast. Patient not taking: Reported on 11/13/2021 07/28/20   Florencia Reasons, MD  guaiFENesin (MUCINEX)  600 MG 12 hr tablet Take 1 tablet (600 mg total) by mouth 2 (two) times daily. Patient not taking: Reported on 11/13/2021 07/27/20   Florencia Reasons, MD  ibuprofen (ADVIL) 800 MG tablet Take 800 mg by mouth 3 (three) times daily as needed for pain. 07/03/20   [provider]  insulin aspart (NOVOLOG) 100 UNIT/ML FlexPen Insulin sliding scale: Blood sugar  120-150   3units                       151-200   4units                       201-250   7units  251- 300  11units                       301-350   15uints                       351-400   20units                       >400         call MD immediately Patient not taking: Reported on 11/12/2021 07/27/20   Florencia Reasons, MD  Insulin Pen Needle 29G X 12MM MISC novolog sliding scale insulin three times a day as needed 07/27/20   Florencia Reasons, MD  levocetirizine (XYZAL) 5 MG tablet Take 5 mg by mouth daily. Patient not taking: Reported on 11/13/2021 09/07/21   [provider]  metFORMIN (GLUCOPHAGE) 500 MG tablet Take 1 tablet (500 mg total) by mouth 2 (two) times daily with a meal. Patient not taking: Reported on 11/13/2021 07/27/20 12/28/21  Florencia Reasons, MD  Multiple Vitamin (MULTIVITAMIN WITH MINERALS) TABS tablet Take 1 tablet by mouth 2 (two) times daily. Patient not taking: Reported on 11/13/2021 07/27/20   Florencia Reasons, MD  nicotine (NICODERM CQ - DOSED IN MG/24 HOURS) 21 mg/24hr patch Place 1 patch (21 mg total) onto the skin daily. (May purchase from over the counter): For nicotine withdrawal symptoms Patient not taking: Reported on 11/13/2021 11/17/17   Lindell Spar I, NP  SUMAtriptan 6 MG/0.5ML SOAJ Inject 6 mg into the muscle daily as needed for migraine. 11/01/21   [provider]  SYMBICORT 160-4.5 MCG/ACT inhaler Inhale 2 puffs into the lungs daily as needed (sob and wheezing). Patient not taking: Reported on 11/13/2021 11/16/17   Lindell Spar I, NP  tiZANidine (ZANAFLEX) 4 MG tablet Take 4 mg by mouth at bedtime as needed for muscle  spasms. Patient not taking: Reported on 11/13/2021 07/11/21   [provider]    Physical Exam: Vitals:   11/14/21 0027 11/14/21 0030 11/14/21 0033 11/14/21 0035  BP:  (!) 127/96    Pulse: 93 97 96 93  Resp: (!) 25 (!) 24 (!) 36 (!) 21  Temp:      TempSrc:      SpO2: 94% 94% (!) 88% 90%  Weight:      Height:        Physical Exam Vitals and nursing note reviewed.  Constitutional:      Comments: Chronically ill-appearing African-American female.  Laying on her right side in bed.  HENT:     Head: Normocephalic and atraumatic.     Nose: Nose normal.  Cardiovascular:     Rate and Rhythm: Normal rate and regular rhythm.  Pulmonary:     Effort: No respiratory distress.     Breath sounds: No stridor. Examination of the right-upper field reveals decreased breath sounds. Examination of the left-upper field reveals decreased breath sounds. Examination of the right-middle field reveals decreased breath sounds. Examination of the left-middle field reveals decreased breath sounds. Examination of the right-lower field reveals decreased breath sounds. Examination of the left-lower field reveals decreased breath sounds. Decreased breath sounds present.  Abdominal:     General: Bowel sounds are normal. There is no distension.     Tenderness: There is no guarding.  Musculoskeletal:     Right lower leg: No edema.     Left lower leg: No edema.  Skin:    General: Skin is warm  and dry.     Labs on Admission: I have personally reviewed following labs and imaging studies  CBC: Recent Labs  Lab 11/12/21 1143 11/13/21 1826  WBC 6.0 10.5  NEUTROABS 3.4 9.9*  HGB 7.4* 8.7*  HCT 26.2* 33.0*  MCV 64.7* 66.1*  PLT 306 737   Basic Metabolic Panel: Recent Labs  Lab 11/12/21 1143 11/13/21 1826  NA 132* 135  K 3.7 4.2  CL 100 99  CO2 27 28  GLUCOSE 94 143*  BUN 9 10  CREATININE 0.58 0.54  CALCIUM 8.0* 8.9  MG 2.5*  --    GFR: Estimated Creatinine Clearance: 55.7 mL/min (by C-G  formula based on SCr of 0.54 mg/dL). Liver Function Tests: Recent Labs  Lab 11/12/21 1143 11/13/21 1826  AST 93* 29  ALT 34 27  ALKPHOS 91 95  BILITOT 0.6 1.0  PROT 7.0 7.2  ALBUMIN 3.7 3.8   No results for input(s): LIPASE, AMYLASE in the last 168 hours. Recent Labs  Lab 11/13/21 1826  AMMONIA 27   Coagulation Profile: No results for input(s): INR, PROTIME in the last 168 hours. Cardiac Enzymes: Recent Labs  Lab 11/13/21 1826  CKTOTAL 192   BNP (last 3 results) No results for input(s): PROBNP in the last 8760 hours. HbA1C: No results for input(s): HGBA1C in the last 72 hours. CBG: No results for input(s): GLUCAP in the last 168 hours. Lipid Profile: No results for input(s): CHOL, HDL, LDLCALC, TRIG, CHOLHDL, LDLDIRECT in the last 72 hours. Thyroid Function Tests: No results for input(s): TSH, T4TOTAL, FREET4, T3FREE, THYROIDAB in the last 72 hours. Anemia Panel: No results for input(s): VITAMINB12, FOLATE, FERRITIN, TIBC, IRON, RETICCTPCT in the last 72 hours. Urine analysis:    Component Value Date/Time   COLORURINE STRAW (A) 11/13/2021 1905   APPEARANCEUR CLEAR 11/13/2021 1905   LABSPEC 1.008 11/13/2021 1905   PHURINE 6.0 11/13/2021 1905   GLUCOSEU 150 (A) 11/13/2021 1905   HGBUR NEGATIVE 11/13/2021 1905   BILIRUBINUR NEGATIVE 11/13/2021 [place] 11/13/2021 1905   PROTEINUR NEGATIVE 11/13/2021 1905   UROBILINOGEN 0.2 09/04/2013 0426   NITRITE NEGATIVE 11/13/2021 1905   LEUKOCYTESUR NEGATIVE 11/13/2021 1905    Radiological Exams on Admission: I have personally reviewed images DG Chest 1 View  Result Date: 11/13/2021 CLINICAL DATA:  Seizure EXAM: CHEST  1 VIEW COMPARISON:  11/13/2021 FINDINGS: The heart size and mediastinal contours are within normal limits. Both lungs are clear. The visualized skeletal structures are unremarkable. Skin fold artifacts over left chest. IMPRESSION: No active disease. Electronically Signed   By: Donavan Foil  M.D.   On: 11/13/2021 17:48   DG Chest 2 View  Result Date: 11/13/2021 CLINICAL DATA:  Wheezing, vomiting, possible aspiration EXAM: CHEST - 2 VIEW COMPARISON:  11/13/2021 at 1800 hours FINDINGS: Lungs are clear.  No pleural effusion or pneumothorax. The heart is normal in size. Mild superior endplate compression fracture deformity of a midthoracic vertebral body, unchanged. IMPRESSION: No evidence of acute cardiopulmonary disease. Mild superior endplate compression fracture deformity of a mid thoracic vertebral body, unchanged. Electronically Signed   By: Julian Hy M.D.   On: 11/13/2021 23:22   DG Chest 2 View  Result Date: 11/13/2021 CLINICAL DATA:  Shortness of breath. EXAM: CHEST - 2 VIEW COMPARISON:  None Available. FINDINGS: The heart size and mediastinal contours are within normal limits. Ill-defined nodular appearing opacities are seen overlying the bilateral lung bases which may represent bilateral nipple shadows. Mildly increased suprahilar lung  markings are noted, bilaterally. There is no evidence of a pleural effusion or pneumothorax. The visualized skeletal structures are unremarkable. IMPRESSION: 1. Findings suggestive of mild viral bronchitis versus mild reactive airway disease. 2. Additional findings which may represent bilateral nipple shadows, correlation with two view chest x-ray with nipple markers is recommended. Electronically Signed   By: Virgina Norfolk M.D.   On: 11/13/2021 01:41   CT HEAD WO CONTRAST (5MM)  Result Date: 11/13/2021 CLINICAL DATA:  Seizure activity after drug and alcohol use EXAM: CT HEAD WITHOUT CONTRAST TECHNIQUE: Contiguous axial images were obtained from the base of the skull through the vertex without intravenous contrast. RADIATION DOSE REDUCTION: This exam was performed according to the departmental dose-optimization program which includes automated exposure control, adjustment of the mA and/or kV according to patient size and/or use of iterative  reconstruction technique. COMPARISON:  07/26/2021 FINDINGS: Brain: No evidence of acute infarction, hemorrhage, cerebral edema, mass, mass effect, or midline shift. No hydrocephalus or extra-axial fluid collection. Vascular: No hyperdense vessel. Skull: Normal. Negative for fracture or focal lesion. Sinuses/Orbits: No acute finding. Other: Fluid in the left mastoid air cells. IMPRESSION: No acute intracranial process. Electronically Signed   By: Merilyn Baba M.D.   On: 11/13/2021 19:02    EKG: My personal interpretation of EKG shows: NSR   Assessment/Plan Principal Problem:   Acute respiratory failure with hypoxia (HCC) Active Problems:   Polysubstance abuse (HCC)   COPD (chronic obstructive pulmonary disease) (HCC)   Essential hypertension   Type 2 diabetes mellitus with diabetic polyneuropathy, with long-term current use of insulin (HCC)   Schizoaffective disorder, bipolar type (HCC)   Microcytic anemia    Assessment and Plan: * Acute respiratory failure with hypoxia ([place]) Admit to medical observation bed. Acute respiratory failure with hypoxia (HCC) is a Acute illness/condition that poses a threat to life or bodily function. Continue with supplemental O2. Etiology could be due to COPD exacerbation vs aspiration. I have asked EDP to order CTPA to rule out PE as cause.   Polysubstance abuse ([place]) Ongoing abuse. Pt UDS positive for cocaine and benzos. Do not give betablockers. Unclear what her "seizure-like" activity was from. Prn IV 1 mg ativan in case she has another "seizure-like" episode. If this happens again, may warrant neurology consult and EEG.   Microcytic anemia Stable. Was iron deficit last year. Unknown if pt has been taking iron or has had GI workup for anemia. Check iron and TIBC.  Schizoaffective disorder, bipolar type (HCC) Chronic. Recently seen by psych yesterday. See their consult note.  Type 2 diabetes mellitus with diabetic polyneuropathy, with long-term current  use of insulin (HCC) Serum glucose 143. Unknown if pt taking her DM meds. Check A1c. Add SSI.  Essential hypertension Stable. Give prn clonidine 0.1 mg for SBP >170 or DBP >100. Do not give any betablockers given recent cocaine use.  COPD (chronic obstructive pulmonary disease) (HCC) Exacerbated. Given IV solumedrol in ER. Will continue duonebs q6h prn and prednisone 40 mg daily.   DVT prophylaxis: SQ Heparin Code Status: Full Code by default Family Communication: no family at bedside  Disposition Plan: return home  Consults called: none  Admission status: Observation, Med-Surg   Kristopher Oppenheim, DO [facility] 11/14/2021, 1:21 AM

## 2021-11-14 NOTE — Consult Note (Signed)
[facility] Face-to-Face Psychiatry Consult   Reason for Consult: Polysubstance abuse, discharged from [facility] Referring Physician:  Dr. Candiss Norse  Patient Identification: [name] MRN:  [id number] Principal Diagnosis: Acute respiratory failure with hypoxia [facility]) Diagnosis:  Principal Problem:   Acute respiratory failure with hypoxia ([place]) Active Problems:   COPD (chronic obstructive pulmonary disease) ([place])   Essential hypertension   Type 2 diabetes mellitus with diabetic polyneuropathy, with long-term current use of insulin (HCC)   Schizoaffective disorder, bipolar type ([place])   Polysubstance abuse ([place])   Microcytic anemia   Total Time spent with patient: 45 minutes I personally spent 45 minutes on the unit in direct patient care. The direct patient care time included face-to-face time with the patient, reviewing the patient's chart, communicating with other professionals, and coordinating care. Greater than 50% of this time was spent in counseling or coordinating care with the patient regarding goals of hospitalization, psycho-education, and discharge planning needs.   Subjective:  [name] is a 56 y.o. female patient admitted with confusion and agitation.  Patient is seen and assessed face to face by this nurse practitioner, chart reviewed, family present at bedside.  Patient is alert and oriented x4, calm and cooperative, and pleasant yet irritable as [name] feels [name] shouldn't be in the hospital again.[name] did grudgingly participate in the psychiatric evaluation, although [name] kept her eyes closed throughout the time.  Patient has limited recollection of events leading up to her admission.  Patient is alert and oriented x 3, irritable yet cooperative.  When discussing events leading up to this admission, patient denies such.  [name] states" I have not used any drugs.  I do not know what happened yesterday. "  Patient continues to minimize her substance use throughout the psychiatric evaluation.  [name]  further denies any acute psychiatric symptoms, to include recent overnight observation at [facility] for polysubstance abuse, suicidal ideations.  Patient denies all symptoms of depression to include sadness, insomnia, poor appetite /increased appetite, weight gain /weight loss, anxiety, suicidal thoughts.  [name] further denies any stressors, triggers that have resulted in use of illicit substances and increase in alcohol use.  [name] further denies any mood swings, irritability, hypersexuality, agitation, and or aggression in the past 2 weeks.  [name] denies any recent hospitalizations but inpatient psychiatric admission, last 1 on file: Behavioral health in 2020 secondary to substance use and psychosis.  However patient has had 4 emergency room visits in 3 months for cocaine abuse, suicidal ideation, and polysubstance abuse.  Patient has had 1 previous suicide attempt when [name] was a teenager.   On evaluation [name] is a 56 year old female with a history significant for schizoaffective, bipolar type, polysubstance abuse history, who presented to the ED with a seizure after discharge from hospital 5 hours prior.  Per chart review patient has had multiple emergency room visits secondary to substance abuse, intoxication, suicidal ideations recently, all of which improved shortly after metabolism of each substance which makes substance-induced psychosis a more likely diagnosis.  On evaluation today patient presents with a normal psychiatric exam, with the exception of irritability.  [name] further denies any illicit substance use and alcohol use, despite urine drug screen being positive for cocaine and benzodiazepines on 11/13/2021, also positive for cocaine on 11/12/2021.  Patient denies any depression, anxiety, irritability, hallucinations.  On today's evaluation [name] is able to hold a linear conversation, coherent, with improved affect this morning compared to day prior.  [name] is not amenable to receive  inpatient substance abuse treatment, and does not appear to understand that impacts her substance use is having on her physical health as well as mental health.  Patient does have a support system to include her daughters, brother, and parents who are all supportive in her decision for complete cessation of illicit substances.  Patient endorses sleeping well and having a good appetite.  [name] denies any suicidal ideations, homicidal ideations, and or auditory or visual hallucinations.  [name] denies any side effects from her medications.  All questions comments and concerns were addressed by both patient and daughter.  At this time patient is psychiatrically cleared, to discharge home with outpatient/inpatient substance abuse treatment.    -Patient did not provide consent to speak with family members during this hospitalization.  [name] is unable to verbalize a reason as to why, and just states" you do not need to talk to anybody in my family.  That is why I am here now."   HPI: 56 year old African-American female long history of polysubstance abuse including cocaine, alcohol, hypertension, type 2 diabetes who was recently released from psychiatry/behavioral health Hospital yesterday evening.  It looks like from the records, [name] was at behavioral health hospital from 11/12/2021 through 11/14/2018 for.  [name] had been seen by the psychiatric nurse practitioner.  During this hospitalization, [name] was brought in by the police because [name] was found dancing [address]  Apparently [name] was admitted to psychiatric hospital for detox.  Past Psychiatric History: Polysubstance abuse, Depression, Schizoaffective;Bipolar type,  Previous dx's of schizophrenia, bipolar - Multiple inpatient stays (at least 3-4) [facility], [facility] at [facility], previously had ACT team - hx of suicide attempt x1 via overdose as a teen  Risk to Self:  No Risk to Others:   No Prior Inpatient Therapy:  [facility] 2020, secondary  to substance use and psychosis Prior Outpatient Therapy: [facility], ACT Team services  Past Medical History:  Past Medical History:  Diagnosis Date   Abdominal pain    Accidental drug overdose April 2013   Anxiety    Atrial fibrillation ([place]) 09/29/11   converted spontaneously   Chronic back pain    Chronic knee pain    Chronic nausea    Chronic pain    COPD (chronic obstructive pulmonary disease) ([place])    Depression    Diabetes mellitus    states her doctor took her off all DM meds in past month   Diabetic neuropathy ([place])    Dyspnea    with exertion    GERD (gastroesophageal reflux disease)    Headache(784.0)    migraines    HTN (hypertension)    not on meds since in a year    Hyperlipidemia    Hypothyroidism    not on meds in a while    Mental disorder    Bipolar and schizophrenic   Requires supplemental oxygen    as needed per patient    Schizophrenia ([place])    Schizophrenia, acute ([place]) 11/13/2017   Tobacco abuse     Past Surgical History:  Procedure Laterality Date   ABDOMINAL HYSTERECTOMY     BLADDER SUSPENSION  03/04/2011   Procedure: [facility] PROCEDURE;  Surgeon: Elayne Snare MacDiarmid;  Location: [facility];  Service: Urology;  Laterality: N/A;   CYSTOCELE REPAIR  03/04/2011   Procedure: ANTERIOR REPAIR (CYSTOCELE);  Surgeon: Reece Packer;  Location: [facility];  Service: Urology;  Laterality: N/A;   CYSTOSCOPY  03/04/2011   Procedure:  CYSTOSCOPY;  Surgeon: Elayne Snare MacDiarmid;  Location: [facility];  Service: Urology;  Laterality: N/A;   ESOPHAGOGASTRODUODENOSCOPY (EGD) WITH PROPOFOL N/A 05/12/2017   Procedure: ESOPHAGOGASTRODUODENOSCOPY (EGD) WITH PROPOFOL;  Surgeon: Alphonsa Overall, MD;  Location: [facility];  Service: General;  Laterality: N/A;   GASTRIC ROUX-EN-Y N/A 03/25/2016   Procedure: LAPAROSCOPIC ROUX-EN-Y GASTRIC BYPASS WITH UPPER ENDOSCOPY;  Surgeon: Excell Seltzer, MD;  Location: [facility];  Service: General;  Laterality: N/A;   KNEE SURGERY      LAPAROSCOPIC ASSISTED VAGINAL HYSTERECTOMY  03/04/2011   Procedure: LAPAROSCOPIC ASSISTED VAGINAL HYSTERECTOMY;  Surgeon: Cyril Mourning, MD;  Location: [facility];  Service: Gynecology;  Laterality: N/A;   Family History:  Family History  Problem Relation Age of Onset   Heart attack Father        17s   Diabetes Mother    Heart disease Mother    Hypertension Mother    Heart attack Sister        8   COPD Other    Breast cancer Neg Hx    Family Psychiatric  History: Mother, maternal cousin and maternal aunt with depression. No family hx of suicide attempt/completion.   Social History:  Social History   Substance and Sexual Activity  Alcohol Use Yes   Comment: daily     Social History   Substance and Sexual Activity  Drug Use Yes   Types: Cocaine, Marijuana, "Crack" cocaine    Social History   Socioeconomic History   Marital status: Legally Separated    Spouse name: Not on file   Number of children: Not on file   Years of education: Not on file   Highest education level: Not on file  Occupational History   Not on file  Tobacco Use   Smoking status: Every Day    Packs/day: 0.50    Types: Cigarettes   Smokeless tobacco: Never  Vaping Use   Vaping Use: Never used  Substance and Sexual Activity   Alcohol use: Yes    Comment: daily   Drug use: Yes    Types: Cocaine, Marijuana, "Crack" cocaine   Sexual activity: Yes    Partners: Male  Other Topics Concern   Not on file  Social History Narrative   Not on file   Social Determinants of Health   Financial Resource Strain: Not on file  Food Insecurity: Not on file  Transportation Needs: Not on file  Physical Activity: Not on file  Stress: Not on file  Social Connections: Not on file   Additional Social History:    Allergies:   Allergies  Allergen Reactions   Aspirin Nausea And Vomiting and Other (See Comments)    Ok to take tylenol or ibuprofen     Labs:  Results for orders placed or performed during  the hospital encounter of 11/13/21 (from the past 48 hour(s))  Comprehensive metabolic panel     Status: Abnormal   Collection Time: 11/13/21  6:26 PM  Result Value Ref Range   Sodium 135 135 - 145 mmol/L   Potassium 4.2 3.5 - 5.1 mmol/L   Chloride 99 98 - 111 mmol/L   CO2 28 22 - 32 mmol/L   Glucose, Bld 143 (H) 70 - 99 mg/dL    Comment: Glucose reference range applies only to samples taken after fasting for at least 8 hours.   BUN 10 6 - 20 mg/dL   Creatinine, Ser 0.54 0.44 - 1.00 mg/dL   Calcium 8.9 8.9 - 10.3 mg/dL  Total Protein 7.2 6.5 - 8.1 g/dL   Albumin 3.8 3.5 - 5.0 g/dL   AST 29 15 - 41 U/L   ALT 27 0 - 44 U/L   Alkaline Phosphatase 95 38 - 126 U/L   Total Bilirubin 1.0 0.3 - 1.2 mg/dL   GFR, Estimated >60 >60 mL/min    Comment: (NOTE) Calculated using the CKD-EPI Creatinine Equation (2021)    Anion gap 8 5 - 15    Comment: Performed at [facility] [address] [place], [place] [address]  Ethanol     Status: None   Collection Time: 11/13/21  6:26 PM  Result Value Ref Range   Alcohol, Ethyl (B) <10 <10 mg/dL    Comment: (NOTE) Lowest detectable limit for serum alcohol is 10 mg/dL.  For medical purposes only. Performed at [facility], [place] [address], [place], [place] [address]   CBC with Diff     Status: Abnormal   Collection Time: 11/13/21  6:26 PM  Result Value Ref Range   WBC 10.5 4.0 - 10.5 K/uL   RBC 4.99 3.87 - 5.11 MIL/uL   Hemoglobin 8.7 (L) 12.0 - 15.0 g/dL    Comment: Reticulocyte Hemoglobin testing may be clinically indicated, consider ordering this additional test [id number]    HCT 33.0 (L) 36.0 - 46.0 %   MCV 66.1 (L) 80.0 - 100.0 fL   MCH 17.4 (L) 26.0 - 34.0 pg   MCHC 26.4 (L) 30.0 - 36.0 g/dL   RDW 20.1 (H) 11.5 - 15.5 %   Platelets 327 150 - 400 K/uL   nRBC 0.0 0.0 - 0.2 %   Neutrophils Relative % 94 %   Neutro Abs 9.9 (H) 1.7 - 7.7 K/uL   Lymphocytes Relative 5 %   Lymphs Abs 0.5 (L) 0.7 - 4.0 K/uL   Monocytes  Relative 1 %   Monocytes Absolute 0.1 0.1 - 1.0 K/uL   Eosinophils Relative 0 %   Eosinophils Absolute 0.0 0.0 - 0.5 K/uL   Basophils Relative 0 %   Basophils Absolute 0.0 0.0 - 0.1 K/uL   WBC Morphology WHITE COUNT CONFIRMED ON SMEAR    RBC Morphology TARGET CELLS    Smear Review PLATELET COUNT CONFIRMED BY SMEAR    nRBC 0 0 /100 WBC   Abs Immature Granulocytes 0.00 0.00 - 0.07 K/uL    Comment: Performed at [facility], [place] [address], [place], [place] [address]  Salicylate level     Status: Abnormal   Collection Time: 11/13/21  6:26 PM  Result Value Ref Range   Salicylate Lvl <6.7 (L) 7.0 - 30.0 mg/dL    Comment: Performed at [facility] [address], [place], [place] [address]  Acetaminophen level     Status: Abnormal   Collection Time: 11/13/21  6:26 PM  Result Value Ref Range   Acetaminophen (Tylenol), Serum <10 (L) 10 - 30 ug/mL    Comment: (NOTE) Therapeutic concentrations vary significantly. A range of 10-30 ug/mL  may be an effective concentration for many patients. However, some  are best treated at concentrations outside of this range. Acetaminophen concentrations >150 ug/mL at 4 hours after ingestion  and >50 ug/mL at 12 hours after ingestion are often associated with  toxic reactions.  Performed at [facility], [place] [address] [place], [place] [address]   CK     Status: None   Collection Time: 11/13/21  6:26 PM  Result Value Ref  Range   Total CK 192 38 - 234 U/L    Comment: Performed at [facility], [place] [address] [place], [place] [address]  Ammonia     Status: None   Collection Time: 11/13/21  6:26 PM  Result Value Ref Range   Ammonia 27 9 - 35 umol/L    Comment: Performed at [facility], [place] [address], [place], [place] [address]  I-Stat beta hCG blood, ED     Status: None   Collection Time: 11/13/21  6:32 PM  Result Value Ref Range   I-stat hCG, quantitative <5.0 <5 mIU/mL   Comment 3            Comment:   GEST. AGE       CONC.  (mIU/mL)   <=1 WEEK        5 - 50     2 WEEKS       50 - 500     3 WEEKS       100 - 10,000     4 WEEKS     1,000 - 30,000        FEMALE AND NON-PREGNANT FEMALE:     LESS THAN 5 mIU/mL   Urine rapid drug screen (hosp performed)     Status: Abnormal   Collection Time: 11/13/21  7:05 PM  Result Value Ref Range   Opiates NONE DETECTED NONE DETECTED   Cocaine POSITIVE (A) NONE DETECTED   Benzodiazepines POSITIVE (A) NONE DETECTED   Amphetamines NONE DETECTED NONE DETECTED   Tetrahydrocannabinol NONE DETECTED NONE DETECTED   Barbiturates NONE DETECTED NONE DETECTED    Comment: (NOTE) DRUG SCREEN FOR MEDICAL PURPOSES ONLY.  IF CONFIRMATION IS NEEDED FOR ANY PURPOSE, NOTIFY LAB WITHIN 5 DAYS.  LOWEST DETECTABLE LIMITS FOR URINE DRUG SCREEN Drug Class                     Cutoff (ng/mL) Amphetamine and metabolites    1000 Barbiturate and metabolites    200 Benzodiazepine                 706 Tricyclics and metabolites     300 Opiates and metabolites        300 Cocaine and metabolites        300 THC                            50 Performed at [facility], [place] [address] [place], [place] [address]   Urinalysis, Routine w reflex microscopic     Status: Abnormal   Collection Time: 11/13/21  7:05 PM  Result Value Ref Range   Color, Urine STRAW (A) YELLOW   APPearance CLEAR CLEAR   Specific Gravity, Urine 1.008 1.005 - 1.030   pH 6.0 5.0 - 8.0   Glucose, UA 150 (A) NEGATIVE mg/dL   Hgb urine dipstick NEGATIVE NEGATIVE   Bilirubin Urine NEGATIVE NEGATIVE   Ketones, ur NEGATIVE NEGATIVE mg/dL   Protein, ur NEGATIVE NEGATIVE mg/dL   Nitrite NEGATIVE NEGATIVE   Leukocytes,Ua NEGATIVE NEGATIVE    Comment: Performed at [facility] [address] [place],  [address]  Resp Panel by RT-PCR (Flu A&B, Covid) Nasopharyngeal Swab     Status: None   Collection Time: 11/14/21  2:46 AM   Specimen: Nasopharyngeal Swab; Nasopharyngeal(NP) swabs in vial transport  medium  Result Value Ref Range   SARS  Coronavirus 2 by RT PCR NEGATIVE NEGATIVE    Comment: (NOTE) SARS-CoV-2 target nucleic acids are NOT DETECTED.  The SARS-CoV-2 RNA is generally detectable in upper respiratory specimens during the acute phase of infection. The lowest concentration of SARS-CoV-2 viral copies this assay can detect is 138 copies/mL. A negative result does not preclude SARS-Cov-2 infection and should not be used as the sole basis for treatment or other patient management decisions. A negative result may occur with  improper specimen collection/handling, submission of specimen other than nasopharyngeal swab, presence of viral mutation(s) within the areas targeted by this assay, and inadequate number of viral copies(<138 copies/mL). A negative result must be combined with clinical observations, patient history, and epidemiological information. The expected result is Negative.  Fact Sheet for Patients:  EntrepreneurPulse.com.au  Fact Sheet for Healthcare Providers:  IncredibleEmployment.be  This test is no t yet approved or cleared by the [place] FDA and  has been authorized for detection and/or diagnosis of SARS-CoV-2 by FDA under an Emergency Use Authorization (EUA). This EUA will remain  in effect (meaning this test can be used) for the duration of the COVID-19 declaration under Section 564(b)(1) of the Act, 21 U.S.C.section 360bbb-3(b)(1), unless the authorization is terminated  or revoked sooner.       Influenza A by PCR NEGATIVE NEGATIVE   Influenza B by PCR NEGATIVE NEGATIVE    Comment: (NOTE) The Xpert Xpress SARS-CoV-2/FLU/RSV plus assay is intended as an aid in the diagnosis of influenza from Nasopharyngeal swab specimens and should not be used as a sole basis for treatment. Nasal washings and aspirates are unacceptable for Xpert Xpress SARS-CoV-2/FLU/RSV testing.  Fact Sheet for  Patients: EntrepreneurPulse.com.au  Fact Sheet for Healthcare Providers: IncredibleEmployment.be  This test is not yet approved or cleared by the [place] FDA and has been authorized for detection and/or diagnosis of SARS-CoV-2 by FDA under an Emergency Use Authorization (EUA). This EUA will remain in effect (meaning this test can be used) for the duration of the COVID-19 declaration under Section 564(b)(1) of the Act, 21 U.S.C. section 360bbb-3(b)(1), unless the authorization is terminated or revoked.  Performed at [facility], [place] [address] [place], [place] [address]   Glucose, capillary     Status: Abnormal   Collection Time: 11/14/21  2:51 AM  Result Value Ref Range   Glucose-Capillary 209 (H) 70 - 99 mg/dL    Comment: Glucose reference range applies only to samples taken after fasting for at least 8 hours.  Iron and TIBC     Status: Abnormal   Collection Time: 11/14/21  5:32 AM  Result Value Ref Range   Iron 12 (L) 28 - 170 ug/dL   TIBC 585 (H) 250 - 450 ug/dL   Saturation Ratios 2 (L) 10.4 - 31.8 %   UIBC 573 ug/dL    Comment: Performed at [facility], [place] [address] [place], [place] [address]  Hemoglobin A1c     Status: Abnormal   Collection Time: 11/14/21  5:32 AM  Result Value Ref Range   Hgb A1c MFr Bld 7.4 (H) 4.8 - 5.6 %    Comment: (NOTE) Pre diabetes:          5.7%-6.4%  Diabetes:              >6.4%  Glycemic control for   <7.0% adults with diabetes    Mean Plasma Glucose 165.68 mg/dL    Comment: Performed at [facility]  [address] [place], [place] [address]  Procalcitonin - Baseline     Status: None   Collection Time: 11/14/21  5:32 AM  Result Value Ref Range   Procalcitonin <0.10 ng/mL    Comment:        Interpretation: PCT (Procalcitonin) <= 0.5 ng/mL: Systemic infection (sepsis) is not likely. Local bacterial infection is possible. (NOTE)       Sepsis PCT Algorithm            Lower Respiratory Tract                                      Infection PCT Algorithm    ----------------------------     ----------------------------         PCT < 0.25 ng/mL                PCT < 0.10 ng/mL          Strongly encourage             Strongly discourage   discontinuation of antibiotics    initiation of antibiotics    ----------------------------     -----------------------------       PCT 0.25 - 0.50 ng/mL            PCT 0.10 - 0.25 ng/mL               OR       >80% decrease in PCT            Discourage initiation of                                            antibiotics      Encourage discontinuation           of antibiotics    ----------------------------     -----------------------------         PCT >= 0.50 ng/mL              PCT 0.26 - 0.50 ng/mL               AND        <80% decrease in PCT             Encourage initiation of                                             antibiotics       Encourage continuation           of antibiotics    ----------------------------     -----------------------------        PCT >= 0.50 ng/mL                  PCT > 0.50 ng/mL               AND         increase in PCT                  Strongly encourage  initiation of antibiotics    Strongly encourage escalation           of antibiotics                                     -----------------------------                                           PCT <= 0.25 ng/mL                                                 OR                                        > 80% decrease in PCT                                      Discontinue / Do not initiate                                             antibiotics  Performed at [facility], [address], [place], [place] [address]   Brain natriuretic peptide     Status: None   Collection Time: 11/14/21  5:32 AM  Result Value Ref Range   B Natriuretic Peptide 36.8 0.0 - 100.0 pg/mL    Comment: Performed at  [facility] [address], [place], [place] [address]  Magnesium     Status: None   Collection Time: 11/14/21  5:32 AM  Result Value Ref Range   Magnesium 2.0 1.7 - 2.4 mg/dL    Comment: Performed at [facility] [address], [place], [place] [address]  C-reactive protein     Status: None   Collection Time: 11/14/21  5:32 AM  Result Value Ref Range   CRP 0.5 <1.0 mg/dL    Comment: Performed at [facility], [place] [address], [place], [place] [address]  MRSA Next Gen by PCR, Nasal     Status: None   Collection Time: 11/14/21  5:52 AM   Specimen: Nasal Mucosa; Nasal Swab  Result Value Ref Range   MRSA by PCR Next Gen NOT DETECTED NOT DETECTED    Comment: (NOTE) The GeneXpert MRSA Assay (FDA approved for NASAL specimens only), is one component of a comprehensive MRSA colonization surveillance program. It is not intended to diagnose MRSA infection nor to guide or monitor treatment for MRSA infections. Test performance is not FDA approved in patients less than 43 years old. Performed at [facility], [place] [address], [place], [place] [address]   Glucose, capillary     Status: Abnormal   Collection Time: 11/14/21  7:52 AM  Result Value Ref Range   Glucose-Capillary 268 (H) 70 - 99 mg/dL    Comment: Glucose reference range applies only to samples taken after fasting for  at least 8 hours.    Current Facility-Administered Medications  Medication Dose Route Frequency Provider Last Rate Last Admin   acetaminophen (TYLENOL) tablet 650 mg  650 mg Oral Q6H PRN Kristopher Oppenheim, DO       Or   acetaminophen (TYLENOL) suppository 650 mg  650 mg Rectal Q6H PRN Kristopher Oppenheim, DO       Ampicillin-Sulbactam (UNASYN) 3 g in sodium chloride 0.9 % 100 mL IVPB  3 g Intravenous Q6H Laren Everts, RPH 200 mL/hr at 11/14/21 1132 3 g at 11/14/21 1132   cloNIDine (CATAPRES) tablet 0.1 mg  0.1 mg Oral Q4H PRN Kristopher Oppenheim, DO       diltiazem (CARDIZEM) tablet 60 mg  60 mg Oral Q12H Thurnell Lose, MD   60 mg at 11/14/21 1123   diphenhydrAMINE (BENADRYL) injection 50 mg  50 mg Intravenous Once Thurnell Lose, MD       heparin injection 5,000 Units  5,000 Units Subcutaneous Q8H Kristopher Oppenheim, DO   5,000 Units at 11/14/21 0554   insulin aspart (novoLOG) injection 0-5 Units  0-5 Units Subcutaneous QHS Kristopher Oppenheim, DO       insulin aspart (novoLOG) injection 0-9 Units  0-9 Units Subcutaneous TID WC Kristopher Oppenheim, DO   5 Units at 11/14/21 0800   ipratropium-albuterol (DUONEB) 0.5-2.5 (3) MG/3ML nebulizer solution 3 mL  3 mL Nebulization Q6H PRN Kristopher Oppenheim, DO       LORazepam (ATIVAN) injection 1 mg  1 mg Intravenous Once PRN Thurnell Lose, MD       LORazepam (ATIVAN) injection 1 mg  1 mg Intravenous PRN Thurnell Lose, MD       methylPREDNISolone sodium succinate (SOLU-MEDROL) 125 mg/2 mL injection 60 mg  60 mg Intravenous Daily Thurnell Lose, MD       ondansetron [facility]) tablet 4 mg  4 mg Oral Q6H PRN Kristopher Oppenheim, DO       Or   ondansetron [facility]) injection 4 mg  4 mg Intravenous Q6H PRN Kristopher Oppenheim, DO        Musculoskeletal: Strength & Muscle Tone: within normal limits Gait & Station: normal Patient leans: N/A   Psychiatric Specialty Exam:  Presentation  General Appearance: Appropriate for Environment; Casual  Eye Contact:Minimal  Speech:Clear and Coherent; Normal Rate  Speech Volume:Normal  Handedness:Right   Mood and Affect  Mood:Euthymic  Affect:Congruent; Appropriate   Thought Process  Thought Processes:Coherent; Linear  Descriptions of Associations:Intact  Orientation:Full (Time, Place and Person)  Thought Content:Logical  History of Schizophrenia/Schizoaffective disorder:No data recorded Duration of Psychotic Symptoms:No data recorded Hallucinations:Hallucinations: None  Ideas of Reference:None  Suicidal Thoughts:Suicidal Thoughts: No  Homicidal Thoughts:Homicidal Thoughts: No   Sensorium  Memory:Immediate Poor; Recent Fair;  Remote Fair  Judgment:Fair  Insight:Fair   Executive Functions  Concentration:Fair  Attention Span:Fair  [place]   Psychomotor Activity  Psychomotor Activity:Psychomotor Activity: Normal   Assets  Assets:Financial Resources/Insurance; Physical Health; Resilience; Social Support; Communication Skills   Sleep  Sleep:Sleep: Fair   Physical Exam: Physical Exam ROS Blood pressure 125/72, pulse (!) 108, temperature 98.6 F (37 C), temperature source Oral, resp. rate 18, height 5' (1.524 m), weight 41.2 kg, last menstrual period 01/08/2011, SpO2 100 %. Body mass index is 17.74 kg/m.  Treatment Plan Summary: Plan Continue prn medications. Patient counseled about substance use and increase substance induced psychosis, and seizures.  -Psych cleared at this time.  -Patient is not interested in  starting naltrexone or gabapentin, for alcohol use disorder.  -Recommend inpatient substance facility however patient does not wish to go at this time. [name] is counseled again on her need to quit. [name] verbalizes understanding, however continues to lack the ability to understand  Daughter also verbalizes understanding.   Disposition: No evidence of imminent risk to self or others at present.   Patient does not meet criteria for psychiatric inpatient admission. Supportive therapy provided about ongoing stressors. Refer to IOP. Discussed crisis plan, support from social network, calling 911, coming to the Emergency Department, and calling Suicide Hotline.  Suella Broad, FNP 11/14/2021 12:27 PM

## 2021-11-14 NOTE — ED Notes (Signed)
During ambulation test pt became short of breath and SPO2 very quickly dropped into the 60s and HR into 130s while walking in place at bedside, pt recovered quickly to SPO2 94% and HR 93 upon laying back down with a nasal canula. Notified PA

## 2021-11-14 NOTE — Progress Notes (Signed)
Mobility Specialist Progress Note:   11/14/21 1200  Mobility  Activity Ambulated with assistance to bathroom  Level of Assistance Standby assist, set-up cues, supervision of patient - no hands on  Assistive Device None  Distance Ambulated (ft) 30 ft  Activity Response Tolerated well  $Mobility charge 1 Mobility   Responded to bed alarm. Pt requesting to go to BR. No assistance needed. Successful void. Back in bed with bed alarm on.   Nelta Numbers Acute Rehab Secure Chat or Office Phone: [phone]

## 2021-11-14 NOTE — Progress Notes (Signed)
Transport here to pick up patient for MRI. Md made aware if ok to transport patient. MD ok. Patient back to baseline . Still on O2@ 4LPM but breathing regular and not labored.

## 2021-11-14 NOTE — Significant Event (Signed)
Rapid Response Event Note   Reason for Call :  Respiratory distress  Initial Focused Assessment:  Patient has recent received test dose of IV iron.  RN at bedside when patient began to have respiratory distress and tachycardia.  RN stopped iron infusion. And Placed patient on NRB  Upon my arrival patient is back on 4L [place].  She still has mildly labored breathing.  Per RN and patient this is her baseline.  Patient denies SOB or difficulty breathing  She is mildly anxious and is asking to go home to smoke. Lung sounds with wheeze.  BP 128/84  HR 104  RR 18  O2 sat 97% on 4L [place]   Interventions:  50 mg Benadryl '60mg'$  Solumedrol 1 mg Ativan  Plan of Care:  RN to call if assistance needed   Event Summary:   MD Notified: Dr Lala Lund Call Time:  [place] Time: 1224 End Time: [place]  Raliegh Ip, RN

## 2021-11-14 NOTE — Subjective & Objective (Signed)
CC: seizure-like activity HPI: 56 year old African-American female long history of polysubstance abuse including cocaine, alcohol, hypertension, type 2 diabetes who was recently released from psychiatry/behavioral health Hospital yesterday evening.  It looks like from the records, she was at behavioral health hospital from 11/12/2021 through 11/14/2018 for.  She had been seen by the psychiatric nurse practitioner.  During this hospitalization, she was brought in by the police because she was found dancing [address]  Apparently she was admitted to psychiatric hospital for detox.  Appears that patient was cleared by psychiatry for discharge.  According to triage notes, patient went home and started using cocaine and alcohol again.  EMS was called and patient was given 5 mg of IV Versed.  Her work-up in the ER was positive for UDS that was positive for cocaine and benzos.  Initially the plan was to discharge patient to home after she woke up however when the patient was ambulated, she was noted to become hypoxic.  She was wheezing according to the EDP and patient was given IV Solu-Medrol and DuoNebs.  Due to continued hypoxia while ambulating, [facility] contacted for admission.  Patient still somewhat sedated and unable to answer questions.

## 2021-11-14 NOTE — Assessment & Plan Note (Signed)
Admit to medical observation bed. Acute respiratory failure with hypoxia (HCC) is a Acute illness/condition that poses a threat to life or bodily function. Continue with supplemental O2. Etiology could be due to COPD exacerbation vs aspiration. I have asked EDP to order CTPA to rule out PE as cause.

## 2021-11-14 NOTE — Assessment & Plan Note (Addendum)
Ongoing abuse. Pt UDS positive for cocaine and benzos. Do not give betablockers. Unclear what her "seizure-like" activity was from. Prn IV 1 mg ativan in case she has another "seizure-like" episode. If this happens again, may warrant neurology consult and EEG.

## 2021-11-14 NOTE — Progress Notes (Addendum)
PROGRESS NOTE                                                                                                                                                                                                             Patient Demographics:    [name], is a 56 y.o. female, DOB - Sep 09, 1965, [id number]  Outpatient Primary MD for the patient is [name], MD    LOS - 0  Admit date - 11/13/2021    Chief Complaint  Patient presents with   Seizures       Brief Narrative (HPI from H&P)   56 year old female with known past medical history of schizoaffective disorder, ongoing polysubstance abuse, COPD, DM type 2 insulin-dependent who was recently admitted to [facility] hospital and discharged on 11/13/2021 was brought in by EMS after a seizure-like episode and indulging in cocaine and alcohol.  In the ER she was found to be in hypoxic respiratory failure, UDS was positive for cocaine and she was admitted for further care.   Subjective:    [name] today has, No headache, No chest pain, No abdominal pain - No Nausea, No new weakness tingling or numbness, mild SOB.   Assessment  & Plan :    Acute respiratory failure with hypoxia (HCC) - recent seizure-like episode suspicious for aspiration pneumonitis, placed on Unasyn, currently on oxygen CTA reassuring, monitor closely, advance activity, I-S and flutter valve for pulmonary toiletry.  Titrate down oxygen as tolerated.   Polysubstance abuse ([place])  - Ongoing abuse. Pt UDS positive for cocaine and benzos.  She was recently admitted at [facility] for help with polysubstance abuse and she is affective disorder, was discharged the day of hospital admission on 11/13/2021.  Counseled to abstain from all substance abuse including cocaine and alcohol.  Monitor for any withdrawals.  Seizure-like episode.  CT head unremarkable, no headache, will check EEG and MRI and monitor on as needed  Ativan.  Not to drive for 6 months with outpatient neurology follow-up.  Chronic microcytic anemia  - Stable. Was iron deficit last year. Unknown if pt has been taking iron or has had GI workup for anemia. Check iron and TIBC.  Iron replacement here.  Schizoaffective disorder, bipolar type ([place])  - Chronic. Recently seen at [place] by psych on 11/13/2021, psych reconsulted for help with  her schizoaffective disorder.  Essential hypertension - Stable. Give prn clonidine 0.1 mg for SBP >170 or DBP >100. Do not give any betablockers given recent cocaine use.  Low-dose calcium channel blocker.  COPD (chronic obstructive pulmonary disease) (HCC) - Exacerbated. Given IV solumedrol in ER. Will continue duonebs q6h prn and prednisone 40 mg daily.   Type 2 diabetes mellitus with diabetic polyneuropathy, with long-term current use of insulin (HCC)  - Serum glucose 143. Unknown if pt taking her DM meds.  Stable A1c.  Glucophage upon discharge.  CBG (last 3)  Recent Labs    11/14/21 0251 11/14/21 0752  GLUCAP 209* 268*    Lab Results  Component Value Date   HGBA1C 7.4 (H) 11/14/2021        Condition -   Guarded  Family Communication  :  daughter [phone] on 11/14/21  Code Status :  Full  Consults  :  Psych  PUD Prophylaxis :    Procedures  :     MRI Brain -  EEG -   CT head  - Non acute  CTA Chest - Subtle bilateral upper lobe peribronchial nodularity with small thin wall cavitary nodules within the left upper lobe, likely infectious or inflammatory in nature. Follow-up imaging in 3-6 months following conservative therapy would be helpful in documenting stability or resolution of the left upper lobe lesions. No pulmonary embolism Aortic Atherosclerosis.      Disposition Plan  :    Status is: Observation  DVT Prophylaxis  :    heparin injection 5,000 Units Start: 11/14/21 0600 SCDs Start: 11/14/21 0243    Lab Results  Component Value Date   PLT 327 11/13/2021    Diet  :  Diet Order             Diet heart healthy/carb modified Room service appropriate? Yes; Fluid consistency: Thin  Diet effective now                    Inpatient Medications  Scheduled Meds:  heparin  5,000 Units Subcutaneous Q8H   insulin aspart  0-5 Units Subcutaneous QHS   insulin aspart  0-9 Units Subcutaneous TID WC   methylPREDNISolone (SOLU-MEDROL) injection  60 mg Intravenous Q24H   Continuous Infusions:  ampicillin-sulbactam (UNASYN) IV 3 g (11/14/21 0612)   PRN Meds:.acetaminophen **OR** acetaminophen, cloNIDine, ipratropium-albuterol, LORazepam, ondansetron **OR** ondansetron (ZOFRAN) IV  Antibiotics  :    Anti-infectives (From admission, onward)    Start     Dose/Rate Route Frequency Ordered Stop   11/14/21 0645  Ampicillin-Sulbactam (UNASYN) 3 g in sodium chloride 0.9 % 100 mL IVPB        3 g 200 mL/hr over 30 Minutes Intravenous Every 6 hours 11/14/21 0558          Time Spent in minutes  30   Lala Lund M.D on 11/14/2021 at 10:46 AM  To page go to [email or web address]   [facility] -  Office  [phone]  See all Orders from today for further details    Objective:   Vitals:   11/14/21 0247 11/14/21 0500 11/14/21 0612 11/14/21 1011  BP: 126/84  127/71 125/72  Pulse: (!) 103  (!) 103 (!) 108  Resp: '20  17 18  '$ Temp: 99.2 F (37.3 C)  98.1 F (36.7 C) 98.6 F (37 C)  TempSrc: Oral  Oral Oral  SpO2: 98%  100% 100%  Weight:  41.2 kg    Height:  Wt Readings from Last 3 Encounters:  11/14/21 41.2 kg  09/05/21 59 kg  10/10/20 53.8 kg     Intake/Output Summary (Last 24 hours) at 11/14/2021 1046 Last data filed at 11/14/2021 1012 Gross per 24 hour  Intake 540 ml  Output --  Net 540 ml     Physical Exam  Awake Alert, No new F.N deficits, Normal affect [place].AT,PERRAL Supple Neck, No JVD,   Symmetrical Chest wall movement, Good air movement bilaterally, CTAB RRR,No Gallops,Rubs or new Murmurs,  +ve B.Sounds, Abd Soft, No  tenderness,   No Cyanosis, Clubbing or edema      Data Review:    CBC Recent Labs  Lab 11/12/21 1143 11/13/21 1826  WBC 6.0 10.5  HGB 7.4* 8.7*  HCT 26.2* 33.0*  PLT 306 327  MCV 64.7* 66.1*  MCH 18.3* 17.4*  MCHC 28.2* 26.4*  RDW 19.9* 20.1*  LYMPHSABS 2.0 0.5*  MONOABS 0.5 0.1  EOSABS 0.1 0.0  BASOSABS 0.0 0.0    Electrolytes Recent Labs  Lab 11/12/21 1143 11/13/21 1826 11/14/21 0532  NA 132* 135  --   K 3.7 4.2  --   CL 100 99  --   CO2 27 28  --   GLUCOSE 94 143*  --   BUN 9 10  --   CREATININE 0.58 0.54  --   CALCIUM 8.0* 8.9  --   AST 93* 29  --   ALT 34 27  --   ALKPHOS 91 95  --   BILITOT 0.6 1.0  --   ALBUMIN 3.7 3.8  --   MG 2.5*  --  2.0  CRP  --   --  0.5  PROCALCITON  --   --  <0.10  HGBA1C  --   --  7.4*  AMMONIA  --  27  --   BNP  --   --  36.8    ------------------------------------------------------------------------------------------------------------------ No results for input(s): CHOL, HDL, LDLCALC, TRIG, CHOLHDL, LDLDIRECT in the last 72 hours.  Lab Results  Component Value Date   HGBA1C 7.4 (H) 11/14/2021    No results for input(s): TSH, T4TOTAL, T3FREE, THYROIDAB in the last 72 hours.  Invalid input(s): FREET3 ------------------------------------------------------------------------------------------------------------------ ID Labs Recent Labs  Lab 11/12/21 1143 11/13/21 1826 11/14/21 0532  WBC 6.0 10.5  --   PLT 306 327  --   CRP  --   --  0.5  PROCALCITON  --   --  <0.10  CREATININE 0.58 0.54  --    Cardiac Enzymes No results for input(s): CKMB, TROPONINI, MYOGLOBIN in the last 168 hours.  Invalid input(s): CK   Radiology Reports DG Chest 1 View  Result Date: 11/13/2021 CLINICAL DATA:  Seizure EXAM: CHEST  1 VIEW COMPARISON:  11/13/2021 FINDINGS: The heart size and mediastinal contours are within normal limits. Both lungs are clear. The visualized skeletal structures are unremarkable. Skin fold artifacts  over left chest. IMPRESSION: No active disease. Electronically Signed   By: Donavan Foil M.D.   On: 11/13/2021 17:48   DG Chest 2 View  Result Date: 11/13/2021 CLINICAL DATA:  Wheezing, vomiting, possible aspiration EXAM: CHEST - 2 VIEW COMPARISON:  11/13/2021 at 1800 hours FINDINGS: Lungs are clear.  No pleural effusion or pneumothorax. The heart is normal in size. Mild superior endplate compression fracture deformity of a midthoracic vertebral body, unchanged. IMPRESSION: No evidence of acute cardiopulmonary disease. Mild superior endplate compression fracture deformity of a mid thoracic vertebral body, unchanged. Electronically Signed  By: Julian Hy M.D.   On: 11/13/2021 23:22   DG Chest 2 View  Result Date: 11/13/2021 CLINICAL DATA:  Shortness of breath. EXAM: CHEST - 2 VIEW COMPARISON:  None Available. FINDINGS: The heart size and mediastinal contours are within normal limits. Ill-defined nodular appearing opacities are seen overlying the bilateral lung bases which may represent bilateral nipple shadows. Mildly increased suprahilar lung markings are noted, bilaterally. There is no evidence of a pleural effusion or pneumothorax. The visualized skeletal structures are unremarkable. IMPRESSION: 1. Findings suggestive of mild viral bronchitis versus mild reactive airway disease. 2. Additional findings which may represent bilateral nipple shadows, correlation with two view chest x-ray with nipple markers is recommended. Electronically Signed   By: Virgina Norfolk M.D.   On: 11/13/2021 01:41   CT HEAD WO CONTRAST (5MM)  Result Date: 11/13/2021 CLINICAL DATA:  Seizure activity after drug and alcohol use EXAM: CT HEAD WITHOUT CONTRAST TECHNIQUE: Contiguous axial images were obtained from the base of the skull through the vertex without intravenous contrast. RADIATION DOSE REDUCTION: This exam was performed according to the departmental dose-optimization program which includes automated exposure  control, adjustment of the mA and/or kV according to patient size and/or use of iterative reconstruction technique. COMPARISON:  07/26/2021 FINDINGS: Brain: No evidence of acute infarction, hemorrhage, cerebral edema, mass, mass effect, or midline shift. No hydrocephalus or extra-axial fluid collection. Vascular: No hyperdense vessel. Skull: Normal. Negative for fracture or focal lesion. Sinuses/Orbits: No acute finding. Other: Fluid in the left mastoid air cells. IMPRESSION: No acute intracranial process. Electronically Signed   By: Merilyn Baba M.D.   On: 11/13/2021 19:02   CT Angio Chest PE W and/or Wo Contrast  Result Date: 11/14/2021 CLINICAL DATA:  Pulmonary embolism (PE) suspected, unknown D-dimer. Acute respiratory failure with hypoxia EXAM: CT ANGIOGRAPHY CHEST WITH CONTRAST TECHNIQUE: Multidetector CT imaging of the chest was performed using the standard protocol during bolus administration of intravenous contrast. Multiplanar CT image reconstructions and MIPs were obtained to evaluate the vascular anatomy. RADIATION DOSE REDUCTION: This exam was performed according to the departmental dose-optimization program which includes automated exposure control, adjustment of the mA and/or kV according to patient size and/or use of iterative reconstruction technique. CONTRAST:  45m OMNIPAQUE IOHEXOL 350 MG/ML SOLN COMPARISON:  None FINDINGS: Cardiovascular: Adequate opacification of the pulmonary arterial tree. No intraluminal filling defect identified to suggest acute pulmonary embolism. Central pulmonary arteries are of normal caliber. No significant coronary artery calcification. Cardiac size within normal limits. No pericardial effusion. Mild atherosclerotic atheromatous plaque within the thoracic aorta. No aortic aneurysm. Mediastinum/Nodes: Visualized thyroid is unremarkable. No pathologic thoracic adenopathy. Esophagus unremarkable. Lungs/Pleura: 2 thin normal cavitary lesions are seen within the left  upper lobe measuring up to 6 mm and a small amount of a ground-glass infiltrate surrounding the at least 1 of the 2 nodules suggesting a infectious or inflammatory process as can be seen with subacute necrotizing pneumonia. Subtle scattered peribronchial nodules are seen within the upper lobes bilaterally and, together, these may reflect changes of atypical infection, including viral pneumonia, smoking related lung disease, hypersensitivity pneumonitis or inhalational injury. No confluent pulmonary infiltrate. No pneumothorax or pleural effusion. No central obstructing lesion. 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 No acute abnormality Musculoskeletal: No acute bone abnormality. No lytic or blastic bone lesion. Review of the MIP images confirms the above findings. IMPRESSION: Subtle bilateral upper lobe peribronchial nodularity with small thin wall cavitary nodules within the left upper lobe, likely infectious  or inflammatory in nature. Follow-up imaging in 3-6 months following conservative therapy would be helpful in documenting stability or resolution of the left upper lobe lesions. No pulmonary embolism Aortic Atherosclerosis (ICD10-I70.0). Electronically Signed   By: Fidela Salisbury M.D.   On: 11/14/2021 02:46

## 2021-11-14 NOTE — Progress Notes (Incomplete)
Mobility Specialist Progress Note:  During Mobility: SpO2 Post Mobility:  Nelta Numbers Acute Rehab Secure Chat or Office Phone: [phone]

## 2021-11-14 NOTE — Progress Notes (Signed)
Pharmacy Antibiotic Note  [name] is a 56 y.o. female admitted on 11/13/2021 with seizure-like activity and acute respiratory failure >> concern for aspiration pneumonia.  Pharmacy has been consulted for Unasyn dosing.  Plan:  Unasyn 3g IV Q6H.  Height: 5' (152.4 cm) Weight: 41.2 kg (90 lb 13.3 oz) IBW/kg (Calculated) : 45.5  Temp (24hrs), Avg:98.8 F (37.1 C), Min:98.3 F (36.8 C), Max:99.2 F (37.3 C)  Recent Labs  Lab 11/12/21 1143 11/13/21 1826  WBC 6.0 10.5  CREATININE 0.58 0.54    Estimated Creatinine Clearance: 50.5 mL/min (by C-G formula based on SCr of 0.54 mg/dL).    Allergies  Allergen Reactions   Aspirin Nausea And Vomiting and Other (See Comments)    Ok to take tylenol or ibuprofen     Thank you for allowing pharmacy to be a part of this patient's care.  Wynona Neat, PharmD, BCPS  11/14/2021 5:57 AM

## 2021-11-14 NOTE — Assessment & Plan Note (Addendum)
Stable. Give prn clonidine 0.1 mg for SBP >170 or DBP >100. Do not give any betablockers given recent cocaine use.

## 2021-11-14 NOTE — Evaluation (Signed)
Clinical/Bedside Swallow Evaluation Patient Details  Name: [name] MRN: [id number] Date of Birth: [birth year]  Today's Date: 11/14/2021 Time: SLP Start Time (ACUTE ONLY): 0932 SLP Stop Time (ACUTE ONLY): 1007 SLP Time Calculation (min) (ACUTE ONLY): 9 min  Past Medical History:  Past Medical History:  Diagnosis Date   Abdominal pain    Accidental drug overdose April 2013   Anxiety    Atrial fibrillation ([place]) 09/29/11   converted spontaneously   Chronic back pain    Chronic knee pain    Chronic nausea    Chronic pain    COPD (chronic obstructive pulmonary disease) ([place])    Depression    Diabetes mellitus    states her doctor took her off all DM meds in past month   Diabetic neuropathy ([place])    Dyspnea    with exertion    GERD (gastroesophageal reflux disease)    Headache(784.0)    migraines    HTN (hypertension)    not on meds since in a year    Hyperlipidemia    Hypothyroidism    not on meds in a while    Mental disorder    Bipolar and schizophrenic   Requires supplemental oxygen    as needed per patient    Schizophrenia ([place])    Schizophrenia, acute ([place]) 11/13/2017   Tobacco abuse    Past Surgical History:  Past Surgical History:  Procedure Laterality Date   ABDOMINAL HYSTERECTOMY     BLADDER SUSPENSION  03/04/2011   Procedure: [facility] PROCEDURE;  Surgeon: Elayne Snare MacDiarmid;  Location: [facility];  Service: Urology;  Laterality: N/A;   CYSTOCELE REPAIR  03/04/2011   Procedure: ANTERIOR REPAIR (CYSTOCELE);  Surgeon: Reece Packer;  Location: [facility];  Service: Urology;  Laterality: N/A;   CYSTOSCOPY  03/04/2011   Procedure: CYSTOSCOPY;  Surgeon: Elayne Snare MacDiarmid;  Location: [facility];  Service: Urology;  Laterality: N/A;   ESOPHAGOGASTRODUODENOSCOPY (EGD) WITH PROPOFOL N/A 05/12/2017   Procedure: ESOPHAGOGASTRODUODENOSCOPY (EGD) WITH PROPOFOL;  Surgeon: Alphonsa Overall, MD;  Location: [facility];  Service: General;  Laterality: N/A;   GASTRIC ROUX-EN-Y N/A  03/25/2016   Procedure: LAPAROSCOPIC ROUX-EN-Y GASTRIC BYPASS WITH UPPER ENDOSCOPY;  Surgeon: Excell Seltzer, MD;  Location: [facility];  Service: General;  Laterality: N/A;   KNEE SURGERY     LAPAROSCOPIC ASSISTED VAGINAL HYSTERECTOMY  03/04/2011   Procedure: LAPAROSCOPIC ASSISTED VAGINAL HYSTERECTOMY;  Surgeon: Cyril Mourning, MD;  Location: [facility];  Service: Gynecology;  Laterality: N/A;   HPI:  Pt with psychiatric history and recent admission to behavioral health for detox readmitted to ED due to dancing in the street. Pt found to have acute respiratory failure with hypoxia.  Etiology could be due to COPD exacerbation vs aspiration pre MD note. Pt has a history of gastric bypass.    Assessment / Plan / Recommendation  Clinical Impression  Pt demonstrates no signs of aspiration or dysphagia. Risk of immediate aspiration from eating and drinking low. There is a history of GERD and gastric bypass which could be a contributing factor. Recommend pt continue current diet. SLP will sign off. SLP Visit Diagnosis: Dysphagia, unspecified (R13.10)    Aspiration Risk  Mild aspiration risk    Diet Recommendation Regular;Thin liquid   Liquid Administration via: Cup;Straw Medication Administration: Whole meds with liquid Supervision: Patient able to self feed    Other  Recommendations      Recommendations for follow up therapy are one component of a multi-disciplinary discharge planning process,  led by the attending physician.  Recommendations may be updated based on patient status, additional functional criteria and insurance authorization.  Follow up Recommendations No SLP follow up      Assistance Recommended at Discharge    Functional Status Assessment    Frequency and Duration            Prognosis        Swallow Study   General HPI: Pt with psychiatric history and recent admission to behavioral health for detox readmitted to ED due to dancing in the street. Pt found to have acute  respiratory failure with hypoxia.  Etiology could be due to COPD exacerbation vs aspiration pre MD note. Type of Study: Bedside Swallow Evaluation Previous Swallow Assessment: noen Diet Prior to this Study: Regular;Thin liquids Temperature Spikes Noted: No Respiratory Status: Nasal cannula History of Recent Intubation: No Behavior/Cognition: Alert;Cooperative;Pleasant mood Oral Cavity Assessment: Within Functional Limits Oral Care Completed by SLP: No Oral Cavity - Dentition: Adequate natural dentition Vision: Functional for self-feeding Self-Feeding Abilities: Able to feed self Patient Positioning: Upright in bed Baseline Vocal Quality: Normal Volitional Cough: Strong Volitional Swallow: Able to elicit    Oral/Motor/Sensory Function Overall Oral Motor/Sensory Function: Within functional limits   Ice Chips     Thin Liquid Thin Liquid: Within functional limits    Nectar Thick Nectar Thick Liquid: Not tested   Honey Thick Honey Thick Liquid: Not tested   Puree Puree: Within functional limits   Solid     Solid: Within functional limits      [name], Katherene Ponto 11/14/2021,10:09 AM

## 2021-11-14 NOTE — Assessment & Plan Note (Signed)
Serum glucose 143. Unknown if pt taking her DM meds. Check A1c. Add SSI.

## 2021-11-14 NOTE — Assessment & Plan Note (Signed)
Exacerbated. Given IV solumedrol in ER. Will continue duonebs q6h prn and prednisone 40 mg daily.

## 2021-11-14 NOTE — Progress Notes (Signed)
Patient was ordered to have Iron IV,  iron was infusing and patient suddenly complaining of unable to breath, patient became very anxious.Iron was stopped. Vitals were taken, patient started on non rebreather, RR called. MD made aware. New orders were given.  Patient anxiety decrease but still requiring prn ativan despite Benadryl. RR nurse at bedside assisting primary nurse. Will continue to monitor.

## 2021-11-15 DIAGNOSIS — J9601 Acute respiratory failure with hypoxia: Secondary | ICD-10-CM | POA: Diagnosis not present

## 2021-11-15 LAB — BRAIN NATRIURETIC PEPTIDE: B Natriuretic Peptide: 23.1 pg/mL (ref 0.0–100.0)

## 2021-11-15 LAB — GLUCOSE, CAPILLARY
Glucose-Capillary: 159 mg/dL — ABNORMAL HIGH (ref 70–99)
Glucose-Capillary: 191 mg/dL — ABNORMAL HIGH (ref 70–99)
Glucose-Capillary: 196 mg/dL — ABNORMAL HIGH (ref 70–99)
Glucose-Capillary: 225 mg/dL — ABNORMAL HIGH (ref 70–99)
Glucose-Capillary: 234 mg/dL — ABNORMAL HIGH (ref 70–99)
Glucose-Capillary: 280 mg/dL — ABNORMAL HIGH (ref 70–99)
Glucose-Capillary: 376 mg/dL — ABNORMAL HIGH (ref 70–99)

## 2021-11-15 LAB — CBC WITH DIFFERENTIAL/PLATELET
Abs Immature Granulocytes: 0.07 10*3/uL (ref 0.00–0.07)
Basophils Absolute: 0 10*3/uL (ref 0.0–0.1)
Basophils Relative: 0 %
Eosinophils Absolute: 0 10*3/uL (ref 0.0–0.5)
Eosinophils Relative: 0 %
HCT: 25.6 % — ABNORMAL LOW (ref 36.0–46.0)
Hemoglobin: 7 g/dL — ABNORMAL LOW (ref 12.0–15.0)
Immature Granulocytes: 1 %
Lymphocytes Relative: 6 %
Lymphs Abs: 0.8 10*3/uL (ref 0.7–4.0)
MCH: 17.7 pg — ABNORMAL LOW (ref 26.0–34.0)
MCHC: 27.3 g/dL — ABNORMAL LOW (ref 30.0–36.0)
MCV: 64.8 fL — ABNORMAL LOW (ref 80.0–100.0)
Monocytes Absolute: 1 10*3/uL (ref 0.1–1.0)
Monocytes Relative: 8 %
Neutro Abs: 11.3 10*3/uL — ABNORMAL HIGH (ref 1.7–7.7)
Neutrophils Relative %: 85 %
Platelets: 267 10*3/uL (ref 150–400)
RBC: 3.95 MIL/uL (ref 3.87–5.11)
RDW: 20 % — ABNORMAL HIGH (ref 11.5–15.5)
WBC: 13.1 10*3/uL — ABNORMAL HIGH (ref 4.0–10.5)
nRBC: 0 % (ref 0.0–0.2)

## 2021-11-15 LAB — COMPREHENSIVE METABOLIC PANEL
ALT: 19 U/L (ref 0–44)
AST: 17 U/L (ref 15–41)
Albumin: 3 g/dL — ABNORMAL LOW (ref 3.5–5.0)
Alkaline Phosphatase: 69 U/L (ref 38–126)
Anion gap: 7 (ref 5–15)
BUN: 19 mg/dL (ref 6–20)
CO2: 27 mmol/L (ref 22–32)
Calcium: 8.5 mg/dL — ABNORMAL LOW (ref 8.9–10.3)
Chloride: 102 mmol/L (ref 98–111)
Creatinine, Ser: 0.69 mg/dL (ref 0.44–1.00)
GFR, Estimated: >60 mL/min (ref >60)
Glucose, Bld: 312 mg/dL — ABNORMAL HIGH (ref 70–99)
Potassium: 3.8 mmol/L (ref 3.5–5.1)
Sodium: 136 mmol/L (ref 135–145)
Total Bilirubin: 0.6 mg/dL (ref 0.3–1.2)
Total Protein: 6.1 g/dL — ABNORMAL LOW (ref 6.5–8.1)

## 2021-11-15 LAB — C-REACTIVE PROTEIN: CRP: 0.5 mg/dL (ref <1.0)

## 2021-11-15 LAB — PROCALCITONIN: Procalcitonin: <0.1 ng/mL

## 2021-11-15 LAB — MAGNESIUM: Magnesium: 2.2 mg/dL (ref 1.7–2.4)

## 2021-11-15 MED ORDER — POTASSIUM CHLORIDE CRYS ER 20 MEQ PO TBCR
40.0000 meq | EXTENDED_RELEASE_TABLET | Freq: Once | ORAL | Status: AC
  Administered 2021-11-15: 40 meq via ORAL
  Filled 2021-11-15: qty 2

## 2021-11-15 MED ORDER — FUROSEMIDE 10 MG/ML IJ SOLN
20.0000 mg | Freq: Once | INTRAMUSCULAR | Status: AC
  Administered 2021-11-15: 20 mg via INTRAVENOUS
  Filled 2021-11-15: qty 2

## 2021-11-15 NOTE — Progress Notes (Signed)
Mobility Specialist Progress Note    11/15/21 1203  Mobility  Activity Transferred from bed to chair  Level of Assistance Contact guard assist, steadying assist  Assistive Device Other (Comment) (HHA)  Distance Ambulated (ft) 5 ft  Activity Response Tolerated well  $Mobility charge 1 Mobility   Pt received in bed and agreeable. Declining further ambulation d/t lunch. Left with call bell in reach and chair alarm on.    Hildred Alamin Mobility Specialist  Primary: 5N M.S. Phone: [phone] Secondary: 6N M.S. Phone: [phone]

## 2021-11-15 NOTE — Progress Notes (Signed)
PROGRESS NOTE                                                                                                                                                                                                             Patient Demographics:    [name], is a 56 y.o. female, DOB - 1965/11/05, SKA:[id number]  Outpatient Primary MD for the patient is [name] Ebbs, MD    LOS - 1  Admit date - 11/13/2021    Chief Complaint  Patient presents with   Seizures       Brief Narrative (HPI from H&P)   56 year old female with known past medical history of schizoaffective disorder, ongoing polysubstance abuse, COPD, DM type 2 insulin-dependent who was recently admitted to [facility] hospital and discharged on 11/13/2021 was brought in by EMS after a seizure-like episode and indulging in cocaine and alcohol.  In the ER she was found to be in hypoxic respiratory failure, UDS was positive for cocaine and she was admitted for further care.   Subjective:   Patient in bed, appears comfortable, denies any headache, no fever, no chest pain or pressure, improved shortness of breath , no abdominal pain. No new focal weakness.    Assessment  & Plan :    Acute respiratory failure with hypoxia (HCC) - recent seizure-like episode suspicious for aspiration pneumonitis, placed on Unasyn, currently on oxygen CTA reassuring, monitor closely, I-S and flutter valve for pulmonary toiletry.  Titrate down oxygen as tolerated and advance activity, clinically improving.   Polysubstance abuse ([place])  - Ongoing abuse. Pt UDS positive for cocaine and benzos.  She was recently seen by psych team for help with polysubstance abuse and she is affective disorder the day before admission and now in the hospital as well, she has been extensively counseled to quit all substance abuse including cocaine, smoking and alcohol, she does not want to be admitted to rehab, refuses  placement, does have the capacity to decide for herself.  Monitor for any withdrawals.  Seizure-like episode.  CT head unremarkable, no headache, stable EEG and MRI, seizure likely due to cocaine use, per daughter previous seizures with cocaine use as well.  Not to drive for 6 months with outpatient neurology follow-up.  Chronic microcytic anemia  - is iron deficient however developed severe allergic reaction to IV iron on  11/14/2021 with appropriate supportive care promptly resolved, PCP to monitor in the outpatient setting.  Currently symptom-free.  Schizoaffective disorder, bipolar type ([place])  - Chronic. Recently seen at [facility] by psych on 11/13/2021, psych reconsulted for help with her schizoaffective disorder.  Essential hypertension - Stable. Give prn clonidine 0.1 mg for SBP >170 or DBP >100. Do not give any betablockers given recent cocaine use.  Low-dose calcium channel blocker.  COPD (chronic obstructive pulmonary disease) (HCC) - Exacerbated. Given IV solumedrol in ER. Will continue duonebs q6h prn and prednisone 40 mg daily.   Type 2 diabetes mellitus with diabetic polyneuropathy, with long-term current use of insulin (HCC)  - Serum glucose 143. Unknown if pt taking her DM meds.  Stable A1c.  Glucophage upon discharge.  CBG (last 3)  Recent Labs    11/14/21 2112 11/15/21 0001 11/15/21 0113  GLUCAP 455* 196* 159*    Lab Results  Component Value Date   HGBA1C 7.4 (H) 11/14/2021        Condition -   Guarded  Family Communication  :  daughter [phone] on 11/14/21, 11/15/21  Code Status :  Full  Consults  :  Psych  PUD Prophylaxis :    Procedures  :     MRI Brain - Non acute  EEG - Non acute  CT head  - Non acute  CTA Chest - Subtle bilateral upper lobe peribronchial nodularity with small thin wall cavitary nodules within the left upper lobe, likely infectious or inflammatory in nature. Follow-up imaging in 3-6 months following conservative therapy would be  helpful in documenting stability or resolution of the left upper lobe lesions. No pulmonary embolism Aortic Atherosclerosis.      Disposition Plan  :    Status is: Observation  DVT Prophylaxis  :    heparin injection 5,000 Units Start: 11/14/21 0600 SCDs Start: 11/14/21 0243    Lab Results  Component Value Date   PLT 267 11/15/2021    Diet :  Diet Order             Diet heart healthy/carb modified Room service appropriate? Yes; Fluid consistency: Thin  Diet effective now                    Inpatient Medications  Scheduled Meds:  diltiazem  60 mg Oral Q12H   furosemide  20 mg Intravenous Once   heparin  5,000 Units Subcutaneous Q8H   insulin aspart  0-5 Units Subcutaneous QHS   insulin aspart  0-9 Units Subcutaneous TID WC   methylPREDNISolone (SOLU-MEDROL) injection  60 mg Intravenous Daily   nicotine  21 mg Transdermal Daily   potassium chloride  40 mEq Oral Once   Continuous Infusions:  ampicillin-sulbactam (UNASYN) IV 3 g (11/15/21 0603)   PRN Meds:.acetaminophen **OR** acetaminophen, cloNIDine, ipratropium-albuterol, LORazepam, ondansetron **OR** ondansetron (ZOFRAN) IV  Antibiotics  :    Anti-infectives (From admission, onward)    Start     Dose/Rate Route Frequency Ordered Stop   11/14/21 0645  Ampicillin-Sulbactam (UNASYN) 3 g in sodium chloride 0.9 % 100 mL IVPB        3 g 200 mL/hr over 30 Minutes Intravenous Every 6 hours 11/14/21 0558          Time Spent in minutes  30   [name] M.D on 11/15/2021 at 8:06 AM  To page go to [email or web address]   [facility] -  Office  [phone]  See all Orders from today for further  details    Objective:   Vitals:   11/14/21 1559 11/14/21 2127 11/15/21 0500 11/15/21 0602  BP: 118/68 (!) 106/55  121/68  Pulse: 99 (!) 102  93  Resp: 18 16    Temp: 98.1 F (36.7 C) 98.7 F (37.1 C)  98 F (36.7 C)  TempSrc: Oral Oral  Oral  SpO2: 100% 100%  100%  Weight:   42.2 kg   Height:         Wt Readings from Last 3 Encounters:  11/15/21 42.2 kg  09/05/21 59 kg  10/10/20 53.8 kg     Intake/Output Summary (Last 24 hours) at 11/15/2021 0806 Last data filed at 11/15/2021 0603 Gross per 24 hour  Intake 1941.77 ml  Output --  Net 1941.77 ml     Physical Exam  Awake Alert, No new F.N deficits, Normal affect [place].AT,PERRAL Supple Neck, No JVD,   Symmetrical Chest wall movement, Good air movement bilaterally, CTAB RRR,No Gallops, Rubs or new Murmurs,  +ve B.Sounds, Abd Soft, No tenderness,   No Cyanosis, Clubbing or edema      Data Review:    CBC Recent Labs  Lab 11/12/21 1143 11/13/21 1826 11/15/21 0332  WBC 6.0 10.5 13.1*  HGB 7.4* 8.7* 7.0*  HCT 26.2* 33.0* 25.6*  PLT 306 327 267  MCV 64.7* 66.1* 64.8*  MCH 18.3* 17.4* 17.7*  MCHC 28.2* 26.4* 27.3*  RDW 19.9* 20.1* 20.0*  LYMPHSABS 2.0 0.5* 0.8  MONOABS 0.5 0.1 1.0  EOSABS 0.1 0.0 0.0  BASOSABS 0.0 0.0 0.0    Electrolytes Recent Labs  Lab 11/12/21 1143 11/13/21 1826 11/14/21 0532 11/15/21 0332  NA 132* 135  --  136  K 3.7 4.2  --  3.8  CL 100 99  --  102  CO2 27 28  --  27  GLUCOSE 94 143*  --  312*  BUN 9 10  --  19  CREATININE 0.58 0.54  --  0.69  CALCIUM 8.0* 8.9  --  8.5*  AST 93* 29  --  17  ALT 34 27  --  19  ALKPHOS 91 95  --  69  BILITOT 0.6 1.0  --  0.6  ALBUMIN 3.7 3.8  --  3.0*  MG 2.5*  --  2.0 2.2  CRP  --   --  0.5 0.5  PROCALCITON  --   --  <0.10 <0.10  HGBA1C  --   --  7.4*  --   AMMONIA  --  27  --   --   BNP  --   --  36.8 23.1    ------------------------------------------------------------------------------------------------------------------ No results for input(s): CHOL, HDL, LDLCALC, TRIG, CHOLHDL, LDLDIRECT in the last 72 hours.  Lab Results  Component Value Date   HGBA1C 7.4 (H) 11/14/2021    No results for input(s): TSH, T4TOTAL, T3FREE, THYROIDAB in the last 72 hours.  Invalid input(s):  FREET3 ------------------------------------------------------------------------------------------------------------------ ID Labs Recent Labs  Lab 11/12/21 1143 11/13/21 1826 11/14/21 0532 11/15/21 0332  WBC 6.0 10.5  --  13.1*  PLT 306 327  --  267  CRP  --   --  0.5 0.5  PROCALCITON  --   --  <0.10 <0.10  CREATININE 0.58 0.54  --  0.69   Cardiac Enzymes No results for input(s): CKMB, TROPONINI, MYOGLOBIN in the last 168 hours.  Invalid input(s): CK   Radiology Reports DG Chest 1 View  Result Date: 11/13/2021 CLINICAL DATA:  Seizure EXAM: CHEST  1 VIEW  COMPARISON:  11/13/2021 FINDINGS: The heart size and mediastinal contours are within normal limits. Both lungs are clear. The visualized skeletal structures are unremarkable. Skin fold artifacts over left chest. IMPRESSION: No active disease. Electronically Signed   By: Donavan Foil M.D.   On: 11/13/2021 17:48   DG Chest 2 View  Result Date: 11/13/2021 CLINICAL DATA:  Wheezing, vomiting, possible aspiration EXAM: CHEST - 2 VIEW COMPARISON:  11/13/2021 at 1800 hours FINDINGS: Lungs are clear.  No pleural effusion or pneumothorax. The heart is normal in size. Mild superior endplate compression fracture deformity of a midthoracic vertebral body, unchanged. IMPRESSION: No evidence of acute cardiopulmonary disease. Mild superior endplate compression fracture deformity of a mid thoracic vertebral body, unchanged. Electronically Signed   By: Julian Hy M.D.   On: 11/13/2021 23:22   DG Chest 2 View  Result Date: 11/13/2021 CLINICAL DATA:  Shortness of breath. EXAM: CHEST - 2 VIEW COMPARISON:  None Available. FINDINGS: The heart size and mediastinal contours are within normal limits. Ill-defined nodular appearing opacities are seen overlying the bilateral lung bases which may represent bilateral nipple shadows. Mildly increased suprahilar lung markings are noted, bilaterally. There is no evidence of a pleural effusion or pneumothorax.  The visualized skeletal structures are unremarkable. IMPRESSION: 1. Findings suggestive of mild viral bronchitis versus mild reactive airway disease. 2. Additional findings which may represent bilateral nipple shadows, correlation with two view chest x-ray with nipple markers is recommended. Electronically Signed   By: Virgina Norfolk M.D.   On: 11/13/2021 01:41   CT HEAD WO CONTRAST (5MM)  Result Date: 11/13/2021 CLINICAL DATA:  Seizure activity after drug and alcohol use EXAM: CT HEAD WITHOUT CONTRAST TECHNIQUE: Contiguous axial images were obtained from the base of the skull through the vertex without intravenous contrast. RADIATION DOSE REDUCTION: This exam was performed according to the departmental dose-optimization program which includes automated exposure control, adjustment of the mA and/or kV according to patient size and/or use of iterative reconstruction technique. COMPARISON:  07/26/2021 FINDINGS: Brain: No evidence of acute infarction, hemorrhage, cerebral edema, mass, mass effect, or midline shift. No hydrocephalus or extra-axial fluid collection. Vascular: No hyperdense vessel. Skull: Normal. Negative for fracture or focal lesion. Sinuses/Orbits: No acute finding. Other: Fluid in the left mastoid air cells. IMPRESSION: No acute intracranial process. Electronically Signed   By: Merilyn Baba M.D.   On: 11/13/2021 19:02   CT Angio Chest [place] W and/or Wo Contrast  Result Date: 11/14/2021 CLINICAL DATA:  Pulmonary embolism ([place]) suspected, unknown D-dimer. Acute respiratory failure with hypoxia EXAM: CT ANGIOGRAPHY CHEST WITH CONTRAST TECHNIQUE: Multidetector CT imaging of the chest was performed using the standard protocol during bolus administration of intravenous contrast. Multiplanar CT image reconstructions and MIPs were obtained to evaluate the vascular anatomy. RADIATION DOSE REDUCTION: This exam was performed according to the departmental dose-optimization program which includes automated  exposure control, adjustment of the mA and/or kV according to patient size and/or use of iterative reconstruction technique. CONTRAST:  28m OMNIPAQUE IOHEXOL 350 MG/ML SOLN COMPARISON:  None FINDINGS: Cardiovascular: Adequate opacification of the pulmonary arterial tree. No intraluminal filling defect identified to suggest acute pulmonary embolism. Central pulmonary arteries are of normal caliber. No significant coronary artery calcification. Cardiac size within normal limits. No pericardial effusion. Mild atherosclerotic atheromatous plaque within the thoracic aorta. No aortic aneurysm. Mediastinum/Nodes: Visualized thyroid is unremarkable. No pathologic thoracic adenopathy. Esophagus unremarkable. Lungs/Pleura: 2 thin normal cavitary lesions are seen within the left upper lobe measuring up to 6 mm  and a small amount of a ground-glass infiltrate surrounding the at least 1 of the 2 nodules suggesting a infectious or inflammatory process as can be seen with subacute necrotizing pneumonia. Subtle scattered peribronchial nodules are seen within the upper lobes bilaterally and, together, these may reflect changes of atypical infection, including viral pneumonia, smoking related lung disease, hypersensitivity pneumonitis or inhalational injury. No confluent pulmonary infiltrate. No pneumothorax or pleural effusion. No central obstructing lesion. 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 No acute abnormality Musculoskeletal: No acute bone abnormality. No lytic or blastic bone lesion. Review of the MIP images confirms the above findings. IMPRESSION: Subtle bilateral upper lobe peribronchial nodularity with small thin wall cavitary nodules within the left upper lobe, likely infectious or inflammatory in nature. Follow-up imaging in 3-6 months following conservative therapy would be helpful in documenting stability or resolution of the left upper lobe lesions. No pulmonary embolism Aortic  Atherosclerosis (ICD10-I70.0). Electronically Signed   By: Fidela Salisbury M.D.   On: 11/14/2021 02:46   MR BRAIN WO CONTRAST  Result Date: 11/14/2021 CLINICAL DATA:  Seizure.  Polysubstance abuse, diabetes EXAM: MRI HEAD WITHOUT CONTRAST TECHNIQUE: Multiplanar, multiecho pulse sequences of the brain and surrounding structures were obtained without intravenous contrast. COMPARISON:  CT head 11/13/2021 FINDINGS: Brain: Limited study. Patient not able to complete the study. Diffusion-weighted imaging is diagnostic. T2 axial images diagnostic. Ventricle size normal. Negative for acute infarct. No significant chronic ischemia. No mass or edema. Vascular: Normal arterial flow voids. Skull and upper cervical spine: Negative Sinuses/Orbits: Paranasal sinuses clear. Negative orbit Other: None IMPRESSION: Limited study. No significant abnormality detected on limited sequences. Electronically Signed   By: Franchot Gallo M.D.   On: 11/14/2021 13:47   DG Chest Port 1 View  Result Date: 11/14/2021 CLINICAL DATA:  Shortness of breath. EXAM: PORTABLE CHEST 1 VIEW COMPARISON:  Chest radiograph Nov 13, 2021 FINDINGS: The heart size and mediastinal contours are within normal limits. No focal airspace consolidation. Peribronchial nodularity and small thin walled cavitary nodules in the upper lobes seen on CT chest Nov 14, 2021 are not evident this examination. No visible pleural effusion or pneumothorax. Mild superior endplate compression deformity of a midthoracic vertebral body is unchanged. Degenerative changes of the shoulders. IMPRESSION: No acute cardiopulmonary disease. Known peribronchial nodularity and small thin walled cavitary nodules in the upper lobes seen on CT chest Nov 14, 2021 are not readily evident this examination. Electronically Signed   By: Dahlia Bailiff M.D.   On: 11/14/2021 11:06   EEG adult  Result Date: 11/14/2021 Lora Havens, MD     11/14/2021 11:57 AM Patient Name: [name] MRN:  [id number] Epilepsy Attending: Lora Havens Referring Physician/Provider: Date: 11/14/2021 Duration: 23.27 mins Patient history: 56 year old female with polysubstance abuse and seizure-like activity.  EEG to evaluate for seizure. Level of alertness: Awake, asleep AEDs during EEG study: None Technical aspects: This EEG study was done with scalp electrodes positioned according to the 10-20 International system of electrode placement. Electrical activity was acquired at a sampling rate of '500Hz'$  and reviewed with a high frequency filter of '70Hz'$  and a low frequency filter of '1Hz'$ . EEG data were recorded continuously and digitally stored. Description: The posterior dominant rhythm consists of 8-9 Hz activity of moderate voltage (25-35 uV) seen predominantly in posterior head regions, symmetric and reactive to eye opening and eye closing. Sleep was characterized by vertex waves, sleep spindles (12 to 14 Hz), maximal frontocentral region. EEG showed intermittent generalized  3 to 6 Hz theta-delta slowing. Hyperventilation and photic stimulation were not performed.   ABNORMALITY - Intermittent slow, generalized IMPRESSION: This study is suggestive of mild diffuse encephalopathy, nonspecific etiology. No seizures or epileptiform discharges were seen throughout the recording. [name]

## 2021-11-15 NOTE — Progress Notes (Signed)
Inpatient Diabetes Program Recommendations  AACE/ADA: New Consensus Statement on Inpatient Glycemic Control (2015)  Target Ranges:  Prepandial:   less than 140 mg/dL      Peak postprandial:   less than 180 mg/dL (1-2 hours)      Critically ill patients:  140 - 180 mg/dL    Latest Reference Range & Units 11/14/21 02:51 11/14/21 07:52 11/14/21 12:36 11/14/21 17:09 11/14/21 21:12  Glucose-Capillary 70 - 99 mg/dL    125 mg Solumedrol '@2321'$  209 (H) 268 (H)  5 units Novolog  40 mg Prednisone  304 (H)  7 units Novolog  60 mg Solumedrol  345 (H)  7 units Novolog  455 (H)  10 units Novolog   (H): Data is abnormally high  Latest Reference Range & Units 11/15/21 00:01 11/15/21 01:13 11/15/21 08:58 11/15/21 12:55  Glucose-Capillary 70 - 99 mg/dL 196 (H) 159 (H) 191 (H)  2 units Novolog  376 (H)  5 units Novolog   (H): Data is abnormally high     Home DM Meds: Metformin 500 mg BID (NOT taking)   Current Orders: Novolog Sensitive Correction Scale/ SSI (0-9 units) TID AC + HS    MD- Note patient getting Solumedrol 60 mg daily  CBG 376 pre-lunch today  If this trend continues, please consider starting Novolog Meal Coverage:  Novolog 3 units TID with meals to start HOLD of pt eats <50% meals    --Will follow patient during hospitalization--  Wyn Quaker RN, MSN, CDE Diabetes Coordinator Inpatient Glycemic Control Team Team Pager: [phone] (8a-5p)

## 2021-11-16 LAB — CBC WITH DIFFERENTIAL/PLATELET
Abs Immature Granulocytes: 0.03 10*3/uL (ref 0.00–0.07)
Basophils Absolute: 0 10*3/uL (ref 0.0–0.1)
Basophils Relative: 0 %
Eosinophils Absolute: 0 10*3/uL (ref 0.0–0.5)
Eosinophils Relative: 0 %
HCT: 27.2 % — ABNORMAL LOW (ref 36.0–46.0)
Hemoglobin: 7.2 g/dL — ABNORMAL LOW (ref 12.0–15.0)
Immature Granulocytes: 0 %
Lymphocytes Relative: 22 %
Lymphs Abs: 2.5 10*3/uL (ref 0.7–4.0)
MCH: 17.3 pg — ABNORMAL LOW (ref 26.0–34.0)
MCHC: 26.5 g/dL — ABNORMAL LOW (ref 30.0–36.0)
MCV: 65.5 fL — ABNORMAL LOW (ref 80.0–100.0)
Monocytes Absolute: 0.9 10*3/uL (ref 0.1–1.0)
Monocytes Relative: 8 %
Neutro Abs: 8.3 10*3/uL — ABNORMAL HIGH (ref 1.7–7.7)
Neutrophils Relative %: 70 %
Platelets: 276 10*3/uL (ref 150–400)
RBC: 4.15 MIL/uL (ref 3.87–5.11)
RDW: 20.2 % — ABNORMAL HIGH (ref 11.5–15.5)
WBC: 11.8 10*3/uL — ABNORMAL HIGH (ref 4.0–10.5)
nRBC: 0 % (ref 0.0–0.2)

## 2021-11-16 LAB — COMPREHENSIVE METABOLIC PANEL
ALT: 24 U/L (ref 0–44)
AST: 33 U/L (ref 15–41)
Albumin: 3.1 g/dL — ABNORMAL LOW (ref 3.5–5.0)
Alkaline Phosphatase: 65 U/L (ref 38–126)
Anion gap: 5 (ref 5–15)
BUN: 18 mg/dL (ref 6–20)
CO2: 30 mmol/L (ref 22–32)
Calcium: 8.2 mg/dL — ABNORMAL LOW (ref 8.9–10.3)
Chloride: 102 mmol/L (ref 98–111)
Creatinine, Ser: 0.65 mg/dL (ref 0.44–1.00)
GFR, Estimated: >60 mL/min (ref >60)
Glucose, Bld: 117 mg/dL — ABNORMAL HIGH (ref 70–99)
Potassium: 3.6 mmol/L (ref 3.5–5.1)
Sodium: 137 mmol/L (ref 135–145)
Total Bilirubin: 0.5 mg/dL (ref 0.3–1.2)
Total Protein: 6.1 g/dL — ABNORMAL LOW (ref 6.5–8.1)

## 2021-11-16 LAB — GLUCOSE, CAPILLARY: Glucose-Capillary: 139 mg/dL — ABNORMAL HIGH (ref 70–99)

## 2021-11-16 LAB — PROCALCITONIN: Procalcitonin: <0.1 ng/mL

## 2021-11-16 LAB — C-REACTIVE PROTEIN: CRP: 0.5 mg/dL (ref <1.0)

## 2021-11-16 LAB — MAGNESIUM: Magnesium: 2.1 mg/dL (ref 1.7–2.4)

## 2021-11-16 LAB — BRAIN NATRIURETIC PEPTIDE: B Natriuretic Peptide: 20.6 pg/mL (ref 0.0–100.0)

## 2021-11-16 MED ORDER — METHYLPREDNISOLONE 4 MG PO TBPK: ORAL_TABLET | ORAL | 0 refills | Status: DC

## 2021-11-16 MED ORDER — METFORMIN HCL 500 MG PO TABS: 500.0000 mg | ORAL_TABLET | Freq: Two times a day (BID) | ORAL | 0 refills | Status: DC

## 2021-11-16 MED ORDER — AMOXICILLIN-POT CLAVULANATE 875-125 MG PO TABS
1.0000 | ORAL_TABLET | Freq: Two times a day (BID) | ORAL | 0 refills | Status: DC
Start: 2021-11-16 — End: 2022-01-13

## 2021-11-16 MED ORDER — PANTOPRAZOLE SODIUM 40 MG PO TBEC: 40.0000 mg | DELAYED_RELEASE_TABLET | Freq: Every day | ORAL | 0 refills | Status: DC

## 2021-11-16 MED ORDER — NICOTINE 21 MG/24HR TD PT24
21.0000 mg | MEDICATED_PATCH | Freq: Every day | TRANSDERMAL | 0 refills | Status: DC
Start: 2021-11-16 — End: 2022-04-09

## 2021-11-16 MED ORDER — FERROUS SULFATE 325 (65 FE) MG PO TABS
325.0000 mg | ORAL_TABLET | Freq: Every day | ORAL | 0 refills | Status: DC
Start: 2021-11-16 — End: 2022-04-05

## 2021-11-16 MED ORDER — PREDNISONE 20 MG PO TABS
40.0000 mg | ORAL_TABLET | Freq: Every day | ORAL | Status: DC
  Administered 2021-11-16: 40 mg via ORAL
  Filled 2021-11-16: qty 2

## 2021-11-16 MED ORDER — DILTIAZEM HCL 60 MG PO TABS
60.0000 mg | ORAL_TABLET | Freq: Two times a day (BID) | ORAL | 0 refills | Status: DC
Start: 2021-11-16 — End: 2022-02-12

## 2021-11-16 NOTE — Discharge Instructions (Addendum)
Do not drive for 6 months till you are cleared by the neurologist to do so.  PCP to arrange for outpatient neurology follow-up   follow with Primary MD [name], MD in 7 days   Get CBC, CMP, Anaemia Panel, 2 view Chest X ray -  checked next visit within 1 week by Primary MD   Activity: As tolerated with Full fall precautions use walker/cane & assistance as needed  Disposition Home   Diet: Heart Healthy  Low Carb  Special Instructions: If you have smoked or chewed Tobacco  in the last 2 yrs please stop smoking, stop any regular Alcohol  and or any Recreational drug use.  On your next visit with your primary care physician please Get Medicines reviewed and adjusted.  Please request your Prim.MD to go over all Hospital Tests and Procedure/Radiological results at the follow up, please get all Hospital records sent to your Prim MD by signing hospital release before you go home.  If you experience worsening of your admission symptoms, develop shortness of breath, life threatening emergency, suicidal or homicidal thoughts you must seek medical attention immediately by calling 911 or calling your MD immediately  if symptoms less severe.  You Must read complete instructions/literature along with all the possible adverse reactions/side effects for all the Medicines you take and that have been prescribed to you. Take any new Medicines after you have completely understood and accpet all the possible adverse reactions/side effects.

## 2021-11-16 NOTE — Progress Notes (Signed)
AVS reviewed with the patient by SWOT RN

## 2021-11-16 NOTE — Progress Notes (Signed)
Mobility Specialist Progress Note:   11/16/21 0930  Mobility  Activity Ambulated independently in room  Level of Assistance Independent  Assistive Device None  Distance Ambulated (ft) 70 ft  Activity Response Tolerated well  $Mobility charge 1 Mobility   During Mobility: HR 121bpm; SpO2 95% RA  Pt agreeable to mobility session. No assistance required. Ambulated with steady gait throughout. Left in bed with all needs met, eager for d/c.  Nelta Numbers Acute Rehab Secure Chat or Office Phone: [phone]

## 2021-11-16 NOTE — Discharge Summary (Signed)
[name]:[id number] DOB: June 07, 1966 DOA: 11/13/2021  PCP: [name] Ebbs, MD  Admit date: 11/13/2021  Discharge date: 11/16/2021  Admitted From: Home   Disposition:  Home   Recommendations for Outpatient Follow-up:   Follow up with PCP in 1-2 weeks  PCP Please obtain BMP/CBC, 2 view CXR in 1week,  (see Discharge instructions)   PCP Please follow up on the following pending results: Monitor anemia panel closely, outpatient anemia work-up as age-appropriate.  Check CBC, CMP, two-view chest X along with magnesium in 7 to 10 days.  Outpatient GI and neurology follow-up to be arranged.  Outpatient psych follow-up for substance abuse and underlying schizoaffective disorder.   Home Health: None   Equipment/Devices: None  Consultations: Psych Discharge Condition: Stable    CODE STATUS: Full    Diet Recommendation: Heart Healthy Low Carb    Chief Complaint  Patient presents with   Seizures     Brief history of present illness from the day of admission and additional interim summary    56 year old female with known past medical history of schizoaffective disorder, ongoing polysubstance abuse, COPD, DM type 2 insulin-dependent who was recently admitted to [facility] hospital and discharged on 11/13/2021 was brought in by EMS after a seizure-like episode and indulging in cocaine and alcohol.  In the ER she was found to be in hypoxic respiratory failure, UDS was positive for cocaine and she was admitted for further care.                                                                 Hospital Course    Acute respiratory failure with hypoxia ([place]) - recent seizure-like episode suspicious for aspiration pneumonitis, placed on Unasyn, CTA reassuring, much improved after IV antibiotics and low-dose IV steroid, now  symptom-free on room air walking in the room without any difficulty, symptom-free and wants to go home.  Request PCP to monitor outpatient closely will receive few days of oral antibiotics along with steroid taper, continue home as needed albuterol inhaler treatment.     Polysubstance abuse ([place])  - Ongoing abuse. Pt UDS positive for cocaine and benzos.  She was recently seen by psych team for help with polysubstance abuse and she is affective disorder the day before admission and now in the hospital as well, she has been extensively counseled to quit all substance abuse including cocaine, smoking and alcohol, she does not want to be admitted to rehab, refuses placement, does have the capacity to decide for herself.  No signs of withdrawal seizure.  Strictly counseled discharge home.  Case discussed with daughter who is a Education officer, museum at the hospital.  She was also seen by psych this admission   Seizure-like episode.  CT head unremarkable, no headache, stable EEG and MRI, seizure likely due  to cocaine use, per daughter previous seizures with cocaine use as well.  Not to drive for 6 months with outpatient neurology follow-up.  Case discussed with Dr. Theda White neurologist on-call no need for AEDs.  Patient given return instructions not to drive till cleared by neurologist to do so.   Chronic microcytic iron deficiency anemia  - is on oral iron supplementation and states she tolerates it well, continue, placed on PPI PCP to do outpatient iron deficiency anemia work-up as age-appropriate.  Of note patient had bad allergic reaction to IV iron transfusion in the hospital.  She tolerates oral form on a chronic basis which will be continued.   Schizoaffective disorder, bipolar type ([place])  - Chronic.  Seen by psych this hospital.  Continue home medications.  Outpatient psych follow-up.  Currently stable.   Essential hypertension - Stable on low-dose Cardizem which will be continued.  Avoiding beta-blocker due to  recent cocaine use.   COPD (chronic obstructive pulmonary disease) (HCC) -mild exacerbation, in good control, switch IV steroids to gentle oral steroid taper.  Counseled to quit smoking, has home albuterol treatment which will be continued on a as needed basis   Type 2 diabetes mellitus with diabetic polyneuropathy, with long-term current use of insulin (HCC)  - Serum glucose 143. Unknown if pt taking her DM meds.  Stable A1c.  Glucophage upon discharge.   Discharge diagnosis     Principal Problem:   Acute respiratory failure with hypoxia (HCC) Active Problems:   Polysubstance abuse (HCC)   COPD (chronic obstructive pulmonary disease) (HCC)   Essential hypertension   Type 2 diabetes mellitus with diabetic polyneuropathy, with long-term current use of insulin (HCC)   Schizoaffective disorder, bipolar type ([place])   Microcytic anemia    Discharge instructions    Discharge Instructions     Discharge instructions   Complete by: As directed    Do not drive for 6 months till you are cleared by the neurologist to do so.  PCP to arrange for outpatient neurology follow-up   Follow with Primary MD [name] Ebbs, MD in 7 days   Get CBC, CMP, Anaemia Panel, 2 view Chest X ray -  checked next visit within 1 week by Primary MD   Activity: As tolerated with Full fall precautions use walker/cane & assistance as needed  Disposition Home   Diet: Heart Healthy  Low Carb  Special Instructions: If you have smoked or chewed Tobacco  in the last 2 yrs please stop smoking, stop any regular Alcohol  and or any Recreational drug use.  On your next visit with your primary care physician please Get Medicines reviewed and adjusted.  Please request your Prim.MD to go over all Hospital Tests and Procedure/Radiological results at the follow up, please get all Hospital records sent to your Prim MD by signing hospital release before you go home.  If you experience worsening of your admission symptoms,  develop shortness of breath, life threatening emergency, suicidal or homicidal thoughts you must seek medical attention immediately by calling 911 or calling your MD immediately  if symptoms less severe.  You Must read complete instructions/literature along with all the possible adverse reactions/side effects for all the Medicines you take and that have been prescribed to you. Take any new Medicines after you have completely understood and accpet all the possible adverse reactions/side effects.   Increase activity slowly   Complete by: As directed        Discharge Medications   Allergies as of  11/16/2021       Reactions   Iron Dextran Shortness Of Breath, Anxiety   Aspirin Nausea And Vomiting, Other (See Comments)   Ok to take tylenol or ibuprofen        Medication List     STOP taking these medications    ibuprofen 800 MG tablet Commonly known as: ADVIL   levocetirizine 5 MG tablet Commonly known as: XYZAL   tiZANidine 4 MG tablet Commonly known as: ZANAFLEX       TAKE these medications    Abilify Maintena 400 MG Prsy prefilled syringe Generic drug: ARIPiprazole ER Inject 400 mg as directed every 28 (twenty-eight) days.   albuterol 108 (90 Base) MCG/ACT inhaler Commonly known as: ProAir HFA Inhale 2 puffs into the lungs every 6 (six) hours as needed for wheezing or shortness of breath.   ALPRAZolam 1 MG tablet Commonly known as: XANAX Take 1 tablet (1 mg total) by mouth 2 (two) times daily as needed for anxiety. What changed: when to take this   amoxicillin-clavulanate 875-125 MG tablet Commonly known as: AUGMENTIN Take 1 tablet by mouth 2 (two) times daily.   blood glucose meter kit and supplies Kit Dispense based on patient and insurance preference. Use up to four times daily as directed. (FOR ICD-9 250.00, 250.01).   Caplyta 42 MG capsule Generic drug: lumateperone tosylate Take 42 mg by mouth at bedtime.   Cholecalciferol 25 MCG (1000 UT)  capsule Take 1 capsule (1,000 Units total) by mouth daily.   diltiazem 60 MG tablet Commonly known as: CARDIZEM Take 1 tablet (60 mg total) by mouth every 12 (twelve) hours.   ferrous sulfate 325 (65 FE) MG tablet Take 1 tablet (325 mg total) by mouth daily with breakfast.   guaiFENesin 600 MG 12 hr tablet Commonly known as: MUCINEX Take 1 tablet (600 mg total) by mouth 2 (two) times daily.   insulin aspart 100 UNIT/ML FlexPen Commonly known as: NOVOLOG Insulin sliding scale: Blood sugar  120-150   3units                       151-200   4units                       201-250   7units                       251- 300  11units                       301-350   15uints                       351-400   20units                       >400         call MD immediately   Insulin Pen Needle 29G X 12MM Misc novolog sliding scale insulin three times a day as needed   metFORMIN 500 MG tablet Commonly known as: Glucophage Take 1 tablet (500 mg total) by mouth 2 (two) times daily with a meal.   methylPREDNISolone 4 MG Tbpk tablet Commonly known as: MEDROL DOSEPAK follow package directions   multivitamin with minerals Tabs tablet Take 1 tablet by mouth 2 (two) times daily.   nicotine 21 mg/24hr patch Commonly known as: NICODERM CQ - dosed in mg/24 hours  Place 1 patch (21 mg total) onto the skin daily. (May purchase from over the counter): For nicotine withdrawal symptoms   pantoprazole 40 MG tablet Commonly known as: Protonix Take 1 tablet (40 mg total) by mouth daily.   SUMAtriptan 6 MG/0.5ML Soaj Inject 6 mg into the muscle daily as needed for migraine.   Symbicort 160-4.5 MCG/ACT inhaler Generic drug: budesonide-formoterol Inhale 2 puffs into the lungs daily as needed (sob and wheezing).         Follow-up Information     [name] Ebbs, MD. Schedule an appointment as soon as possible for a visit in 1 week(s).   Specialty: Internal Medicine Why: Get anemia panel, CBC, BMP  checked.  You need outpatient GI appointment for anemia work-up. Contact information: [address] [address] [phone]                 Major procedures and Radiology Reports - PLEASE review detailed and final reports thoroughly  -       DG Chest 1 View  Result Date: 11/13/2021 CLINICAL DATA:  Seizure EXAM: CHEST  1 VIEW COMPARISON:  11/13/2021 FINDINGS: The heart size and mediastinal contours are within normal limits. Both lungs are clear. The visualized skeletal structures are unremarkable. Skin fold artifacts over left chest. IMPRESSION: No active disease. Electronically Signed   By: Donavan Foil M.D.   On: 11/13/2021 17:48   DG Chest 2 View  Result Date: 11/13/2021 CLINICAL DATA:  Wheezing, vomiting, possible aspiration EXAM: CHEST - 2 VIEW COMPARISON:  11/13/2021 at 1800 hours FINDINGS: Lungs are clear.  No pleural effusion or pneumothorax. The heart is normal in size. Mild superior endplate compression fracture deformity of a midthoracic vertebral body, unchanged. IMPRESSION: No evidence of acute cardiopulmonary disease. Mild superior endplate compression fracture deformity of a mid thoracic vertebral body, unchanged. Electronically Signed   By: Julian Hy M.D.   On: 11/13/2021 23:22   DG Chest 2 View  Result Date: 11/13/2021 CLINICAL DATA:  Shortness of breath. EXAM: CHEST - 2 VIEW COMPARISON:  None Available. FINDINGS: The heart size and mediastinal contours are within normal limits. Ill-defined nodular appearing opacities are seen overlying the bilateral lung bases which may represent bilateral nipple shadows. Mildly increased suprahilar lung markings are noted, bilaterally. There is no evidence of a pleural effusion or pneumothorax. The visualized skeletal structures are unremarkable. IMPRESSION: 1. Findings suggestive of mild viral bronchitis versus mild reactive airway disease. 2. Additional findings which may represent bilateral nipple shadows,  correlation with two view chest x-ray with nipple markers is recommended. Electronically Signed   By: Virgina Norfolk M.D.   On: 11/13/2021 01:41   CT HEAD WO CONTRAST (5MM)  Result Date: 11/13/2021 CLINICAL DATA:  Seizure activity after drug and alcohol use EXAM: CT HEAD WITHOUT CONTRAST TECHNIQUE: Contiguous axial images were obtained from the base of the skull through the vertex without intravenous contrast. RADIATION DOSE REDUCTION: This exam was performed according to the departmental dose-optimization program which includes automated exposure control, adjustment of the mA and/or kV according to patient size and/or use of iterative reconstruction technique. COMPARISON:  07/26/2021 FINDINGS: Brain: No evidence of acute infarction, hemorrhage, cerebral edema, mass, mass effect, or midline shift. No hydrocephalus or extra-axial fluid collection. Vascular: No hyperdense vessel. Skull: Normal. Negative for fracture or focal lesion. Sinuses/Orbits: No acute finding. Other: Fluid in the left mastoid air cells. IMPRESSION: No acute intracranial process. Electronically Signed   By: Merilyn Baba M.D.   On: 11/13/2021  19:02   CT Angio Chest PE W and/or Wo Contrast  Result Date: 11/14/2021 CLINICAL DATA:  Pulmonary embolism (PE) suspected, unknown D-dimer. Acute respiratory failure with hypoxia EXAM: CT ANGIOGRAPHY CHEST WITH CONTRAST TECHNIQUE: Multidetector CT imaging of the chest was performed using the standard protocol during bolus administration of intravenous contrast. Multiplanar CT image reconstructions and MIPs were obtained to evaluate the vascular anatomy. RADIATION DOSE REDUCTION: This exam was performed according to the departmental dose-optimization program which includes automated exposure control, adjustment of the mA and/or kV according to patient size and/or use of iterative reconstruction technique. CONTRAST:  62m OMNIPAQUE IOHEXOL 350 MG/ML SOLN COMPARISON:  None FINDINGS: Cardiovascular:  Adequate opacification of the pulmonary arterial tree. No intraluminal filling defect identified to suggest acute pulmonary embolism. Central pulmonary arteries are of normal caliber. No significant coronary artery calcification. Cardiac size within normal limits. No pericardial effusion. Mild atherosclerotic atheromatous plaque within the thoracic aorta. No aortic aneurysm. Mediastinum/Nodes: Visualized thyroid is unremarkable. No pathologic thoracic adenopathy. Esophagus unremarkable. Lungs/Pleura: 2 thin normal cavitary lesions are seen within the left upper lobe measuring up to 6 mm and a small amount of a ground-glass infiltrate surrounding the at least 1 of the 2 nodules suggesting a infectious or inflammatory process as can be seen with subacute necrotizing pneumonia. Subtle scattered peribronchial nodules are seen within the upper lobes bilaterally and, together, these may reflect changes of atypical infection, including viral pneumonia, smoking related lung disease, hypersensitivity pneumonitis or inhalational injury. No confluent pulmonary infiltrate. No pneumothorax or pleural effusion. No central obstructing lesion. 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 No acute abnormality Musculoskeletal: No acute bone abnormality. No lytic or blastic bone lesion. Review of the MIP images confirms the above findings. IMPRESSION: Subtle bilateral upper lobe peribronchial nodularity with small thin wall cavitary nodules within the left upper lobe, likely infectious or inflammatory in nature. Follow-up imaging in 3-6 months following conservative therapy would be helpful in documenting stability or resolution of the left upper lobe lesions. No pulmonary embolism Aortic Atherosclerosis (ICD10-I70.0). Electronically Signed   By: AFidela SalisburyM.D.   On: 11/14/2021 02:46   MR BRAIN WO CONTRAST  Result Date: 11/14/2021 CLINICAL DATA:  Seizure.  Polysubstance abuse, diabetes EXAM: MRI HEAD  WITHOUT CONTRAST TECHNIQUE: Multiplanar, multiecho pulse sequences of the brain and surrounding structures were obtained without intravenous contrast. COMPARISON:  CT head 11/13/2021 FINDINGS: Brain: Limited study. Patient not able to complete the study. Diffusion-weighted imaging is diagnostic. T2 axial images diagnostic. Ventricle size normal. Negative for acute infarct. No significant chronic ischemia. No mass or edema. Vascular: Normal arterial flow voids. Skull and upper cervical spine: Negative Sinuses/Orbits: Paranasal sinuses clear. Negative orbit Other: None IMPRESSION: Limited study. No significant abnormality detected on limited sequences. Electronically Signed   By: CFranchot GalloM.D.   On: 11/14/2021 13:47   DG Chest Port 1 View  Result Date: 11/14/2021 CLINICAL DATA:  Shortness of breath. EXAM: PORTABLE CHEST 1 VIEW COMPARISON:  Chest radiograph Nov 13, 2021 FINDINGS: The heart size and mediastinal contours are within normal limits. No focal airspace consolidation. Peribronchial nodularity and small thin walled cavitary nodules in the upper lobes seen on CT chest Nov 14, 2021 are not evident this examination. No visible pleural effusion or pneumothorax. Mild superior endplate compression deformity of a midthoracic vertebral body is unchanged. Degenerative changes of the shoulders. IMPRESSION: No acute cardiopulmonary disease. Known peribronchial nodularity and small thin walled cavitary nodules in the upper lobes seen on CT  chest Nov 14, 2021 are not readily evident this examination. Electronically Signed   By: Dahlia Bailiff M.D.   On: 11/14/2021 11:06   EEG adult  Result Date: 11/14/2021 Lora Havens, MD     11/14/2021 11:57 AM Patient Name: [name] MRN: [id number] Epilepsy Attending: Lora Havens Referring Physician/Provider: Date: 11/14/2021 Duration: 23.27 mins Patient history: 56 year old female with polysubstance abuse and seizure-like activity.  EEG to evaluate for  seizure. Level of alertness: Awake, asleep AEDs during EEG study: None Technical aspects: This EEG study was done with scalp electrodes positioned according to the 10-20 International system of electrode placement. Electrical activity was acquired at a sampling rate of 500Hz  and reviewed with a high frequency filter of 70Hz  and a low frequency filter of 1Hz . EEG data were recorded continuously and digitally stored. Description: The posterior dominant rhythm consists of 8-9 Hz activity of moderate voltage (25-35 uV) seen predominantly in posterior head regions, symmetric and reactive to eye opening and eye closing. Sleep was characterized by vertex waves, sleep spindles (12 to 14 Hz), maximal frontocentral region. EEG showed intermittent generalized 3 to 6 Hz theta-delta slowing. Hyperventilation and photic stimulation were not performed.   ABNORMALITY - Intermittent slow, generalized IMPRESSION: This study is suggestive of mild diffuse encephalopathy, nonspecific etiology. No seizures or epileptiform discharges were seen throughout the recording. Priyanka Barbra Sarks    Today   Subjective    Amran Heminger today has no headache,no chest abdominal pain,no new weakness tingling or numbness, feels much better wants to go home today.     Objective   Blood pressure 121/73, pulse 99, temperature 98.1 F (36.7 C), temperature source Oral, resp. rate 16, height 5' (1.524 m), weight 42.2 kg, last menstrual period 01/08/2011, SpO2 97 %.   Intake/Output Summary (Last 24 hours) at 11/16/2021 0857 Last data filed at 11/16/2021 0603 Gross per 24 hour  Intake 2360 ml  Output --  Net 2360 ml    Exam  Awake Alert, No new F.N deficits,    [place].AT,PERRAL Supple Neck,   Symmetrical Chest wall movement, Good air movement bilaterally, CTAB RRR,No Gallops,   +ve B.Sounds, Abd Soft, Non tender,  No Cyanosis, Clubbing or edema    Data Review   Recent Labs  Lab 11/12/21 1143 11/13/21 1826 11/15/21 0332  11/16/21 0042  WBC 6.0 10.5 13.1* 11.8*  HGB 7.4* 8.7* 7.0* 7.2*  HCT 26.2* 33.0* 25.6* 27.2*  PLT 306 327 267 276  MCV 64.7* 66.1* 64.8* 65.5*  MCH 18.3* 17.4* 17.7* 17.3*  MCHC 28.2* 26.4* 27.3* 26.5*  RDW 19.9* 20.1* 20.0* 20.2*  LYMPHSABS 2.0 0.5* 0.8 2.5  MONOABS 0.5 0.1 1.0 0.9  EOSABS 0.1 0.0 0.0 0.0  BASOSABS 0.0 0.0 0.0 0.0    Recent Labs  Lab 11/12/21 1143 11/13/21 1826 11/14/21 0532 11/15/21 0332 11/16/21 0042  NA 132* 135  --  136 137  K 3.7 4.2  --  3.8 3.6  CL 100 99  --  102 102  CO2 27 28  --  27 30  GLUCOSE 94 143*  --  312* 117*  BUN 9 10  --  19 18  CREATININE 0.58 0.54  --  0.69 0.65  CALCIUM 8.0* 8.9  --  8.5* 8.2*  AST 93* 29  --  17 33  ALT 34 27  --  19 24  ALKPHOS 91 95  --  69 65  BILITOT 0.6 1.0  --  0.6 0.5  ALBUMIN 3.7  3.8  --  3.0* 3.1*  MG 2.5*  --  2.0 2.2 2.1  CRP  --   --  0.5 0.5 0.5  PROCALCITON  --   --  <0.10 <0.10 <0.10  HGBA1C  --   --  7.4*  --   --   AMMONIA  --  27  --   --   --   BNP  --   --  36.8 23.1 20.6    Total Time in preparing paper work, data evaluation and todays exam - 35 minutes  Lala Lund M.D on 11/16/2021 at 8:57 AM  [facility]

## 2022-01-12 ENCOUNTER — Other Ambulatory Visit: Payer: Self-pay

## 2022-01-12 ENCOUNTER — Emergency Department (HOSPITAL_COMMUNITY): Payer: Medicare Other

## 2022-01-12 ENCOUNTER — Inpatient Hospital Stay (HOSPITAL_COMMUNITY)
Admission: EM | Admit: 2022-01-12 | Discharge: 2022-02-12 | DRG: 004 | Disposition: A | Discharge status: Other Acute Inpatient Hospital | Payer: Medicare Other | Attending: Family Medicine | Admitting: Family Medicine

## 2022-01-12 DIAGNOSIS — Z720 Tobacco use: Secondary | ICD-10-CM

## 2022-01-12 DIAGNOSIS — Z9151 Personal history of suicidal behavior: Secondary | ICD-10-CM

## 2022-01-12 DIAGNOSIS — K567 Ileus, unspecified: Secondary | ICD-10-CM | POA: Diagnosis not present

## 2022-01-12 DIAGNOSIS — R000 Tachycardia, unspecified: Secondary | ICD-10-CM | POA: Diagnosis not present

## 2022-01-12 DIAGNOSIS — J9811 Atelectasis: Secondary | ICD-10-CM | POA: Diagnosis not present

## 2022-01-12 DIAGNOSIS — T433X2A Poisoning by phenothiazine antipsychotics and neuroleptics, intentional self-harm, initial encounter: Secondary | ICD-10-CM | POA: Diagnosis present

## 2022-01-12 DIAGNOSIS — E43 Unspecified severe protein-calorie malnutrition: Secondary | ICD-10-CM | POA: Diagnosis present

## 2022-01-12 DIAGNOSIS — Y92009 Unspecified place in unspecified non-institutional (private) residence as the place of occurrence of the external cause: Secondary | ICD-10-CM | POA: Diagnosis not present

## 2022-01-12 DIAGNOSIS — J9601 Acute respiratory failure with hypoxia: Secondary | ICD-10-CM | POA: Diagnosis present

## 2022-01-12 DIAGNOSIS — Z20822 Contact with and (suspected) exposure to covid-19: Secondary | ICD-10-CM | POA: Diagnosis present

## 2022-01-12 DIAGNOSIS — T50902A Poisoning by unspecified drugs, medicaments and biological substances, intentional self-harm, initial encounter: Secondary | ICD-10-CM | POA: Diagnosis not present

## 2022-01-12 DIAGNOSIS — T510X2A Toxic effect of ethanol, intentional self-harm, initial encounter: Secondary | ICD-10-CM | POA: Diagnosis present

## 2022-01-12 DIAGNOSIS — D649 Anemia, unspecified: Secondary | ICD-10-CM

## 2022-01-12 DIAGNOSIS — E039 Hypothyroidism, unspecified: Secondary | ICD-10-CM | POA: Diagnosis present

## 2022-01-12 DIAGNOSIS — N390 Urinary tract infection, site not specified: Secondary | ICD-10-CM | POA: Diagnosis not present

## 2022-01-12 DIAGNOSIS — J90 Pleural effusion, not elsewhere classified: Secondary | ICD-10-CM | POA: Diagnosis not present

## 2022-01-12 DIAGNOSIS — F10139 Alcohol abuse with withdrawal, unspecified: Secondary | ICD-10-CM | POA: Diagnosis not present

## 2022-01-12 DIAGNOSIS — Z1231 Encounter for screening mammogram for malignant neoplasm of breast: Secondary | ICD-10-CM

## 2022-01-12 DIAGNOSIS — E1140 Type 2 diabetes mellitus with diabetic neuropathy, unspecified: Secondary | ICD-10-CM | POA: Diagnosis present

## 2022-01-12 DIAGNOSIS — E559 Vitamin D deficiency, unspecified: Secondary | ICD-10-CM

## 2022-01-12 DIAGNOSIS — Z825 Family history of asthma and other chronic lower respiratory diseases: Secondary | ICD-10-CM

## 2022-01-12 DIAGNOSIS — Z833 Family history of diabetes mellitus: Secondary | ICD-10-CM

## 2022-01-12 DIAGNOSIS — E8770 Fluid overload, unspecified: Secondary | ICD-10-CM | POA: Diagnosis present

## 2022-01-12 DIAGNOSIS — Z79899 Other long term (current) drug therapy: Secondary | ICD-10-CM

## 2022-01-12 DIAGNOSIS — R9401 Abnormal electroencephalogram [EEG]: Secondary | ICD-10-CM | POA: Diagnosis present

## 2022-01-12 DIAGNOSIS — E876 Hypokalemia: Secondary | ICD-10-CM | POA: Diagnosis present

## 2022-01-12 DIAGNOSIS — R64 Cachexia: Secondary | ICD-10-CM | POA: Diagnosis present

## 2022-01-12 DIAGNOSIS — Z9884 Bariatric surgery status: Secondary | ICD-10-CM

## 2022-01-12 DIAGNOSIS — F17213 Nicotine dependence, cigarettes, with withdrawal: Secondary | ICD-10-CM | POA: Diagnosis present

## 2022-01-12 DIAGNOSIS — T50902D Poisoning by unspecified drugs, medicaments and biological substances, intentional self-harm, subsequent encounter: Secondary | ICD-10-CM | POA: Diagnosis not present

## 2022-01-12 DIAGNOSIS — F250 Schizoaffective disorder, bipolar type: Secondary | ICD-10-CM | POA: Diagnosis present

## 2022-01-12 DIAGNOSIS — J441 Chronic obstructive pulmonary disease with (acute) exacerbation: Secondary | ICD-10-CM | POA: Diagnosis present

## 2022-01-12 DIAGNOSIS — J449 Chronic obstructive pulmonary disease, unspecified: Secondary | ICD-10-CM | POA: Diagnosis present

## 2022-01-12 DIAGNOSIS — R401 Stupor: Secondary | ICD-10-CM | POA: Diagnosis present

## 2022-01-12 DIAGNOSIS — F1410 Cocaine abuse, uncomplicated: Secondary | ICD-10-CM | POA: Diagnosis present

## 2022-01-12 DIAGNOSIS — Z888 Allergy status to other drugs, medicaments and biological substances status: Secondary | ICD-10-CM

## 2022-01-12 DIAGNOSIS — G8929 Other chronic pain: Secondary | ICD-10-CM | POA: Diagnosis present

## 2022-01-12 DIAGNOSIS — Y906 Blood alcohol level of 120-199 mg/100 ml: Secondary | ICD-10-CM | POA: Diagnosis present

## 2022-01-12 DIAGNOSIS — R4182 Altered mental status, unspecified: Secondary | ICD-10-CM | POA: Diagnosis not present

## 2022-01-12 DIAGNOSIS — K5901 Slow transit constipation: Secondary | ICD-10-CM

## 2022-01-12 DIAGNOSIS — I959 Hypotension, unspecified: Secondary | ICD-10-CM | POA: Diagnosis not present

## 2022-01-12 DIAGNOSIS — G928 Other toxic encephalopathy: Secondary | ICD-10-CM | POA: Diagnosis present

## 2022-01-12 DIAGNOSIS — Z90710 Acquired absence of both cervix and uterus: Secondary | ICD-10-CM

## 2022-01-12 DIAGNOSIS — E1165 Type 2 diabetes mellitus with hyperglycemia: Secondary | ICD-10-CM | POA: Diagnosis present

## 2022-01-12 DIAGNOSIS — E119 Type 2 diabetes mellitus without complications: Secondary | ICD-10-CM

## 2022-01-12 DIAGNOSIS — K5900 Constipation, unspecified: Secondary | ICD-10-CM

## 2022-01-12 DIAGNOSIS — E871 Hypo-osmolality and hyponatremia: Secondary | ICD-10-CM | POA: Diagnosis present

## 2022-01-12 DIAGNOSIS — J690 Pneumonitis due to inhalation of food and vomit: Secondary | ICD-10-CM | POA: Diagnosis not present

## 2022-01-12 DIAGNOSIS — Z6820 Body mass index (BMI) 20.0-20.9, adult: Secondary | ICD-10-CM

## 2022-01-12 DIAGNOSIS — T501X5A Adverse effect of loop [high-ceiling] diuretics, initial encounter: Secondary | ICD-10-CM | POA: Diagnosis not present

## 2022-01-12 DIAGNOSIS — E54 Ascorbic acid deficiency: Secondary | ICD-10-CM | POA: Diagnosis present

## 2022-01-12 DIAGNOSIS — E162 Hypoglycemia, unspecified: Secondary | ICD-10-CM | POA: Diagnosis not present

## 2022-01-12 DIAGNOSIS — Z430 Encounter for attention to tracheostomy: Secondary | ICD-10-CM | POA: Diagnosis not present

## 2022-01-12 DIAGNOSIS — R569 Unspecified convulsions: Secondary | ICD-10-CM | POA: Diagnosis not present

## 2022-01-12 DIAGNOSIS — Z8249 Family history of ischemic heart disease and other diseases of the circulatory system: Secondary | ICD-10-CM

## 2022-01-12 DIAGNOSIS — E785 Hyperlipidemia, unspecified: Secondary | ICD-10-CM | POA: Diagnosis present

## 2022-01-12 DIAGNOSIS — M549 Dorsalgia, unspecified: Secondary | ICD-10-CM | POA: Diagnosis present

## 2022-01-12 DIAGNOSIS — E509 Vitamin A deficiency, unspecified: Secondary | ICD-10-CM | POA: Diagnosis present

## 2022-01-12 DIAGNOSIS — Z716 Tobacco abuse counseling: Secondary | ICD-10-CM

## 2022-01-12 DIAGNOSIS — Y92239 Unspecified place in hospital as the place of occurrence of the external cause: Secondary | ICD-10-CM | POA: Diagnosis not present

## 2022-01-12 DIAGNOSIS — D509 Iron deficiency anemia, unspecified: Secondary | ICD-10-CM

## 2022-01-12 DIAGNOSIS — G929 Unspecified toxic encephalopathy: Secondary | ICD-10-CM | POA: Diagnosis not present

## 2022-01-12 DIAGNOSIS — G9341 Metabolic encephalopathy: Secondary | ICD-10-CM | POA: Diagnosis not present

## 2022-01-12 DIAGNOSIS — I10 Essential (primary) hypertension: Secondary | ICD-10-CM | POA: Diagnosis present

## 2022-01-12 DIAGNOSIS — Z886 Allergy status to analgesic agent status: Secondary | ICD-10-CM

## 2022-01-12 DIAGNOSIS — G4700 Insomnia, unspecified: Secondary | ICD-10-CM | POA: Diagnosis present

## 2022-01-12 DIAGNOSIS — K219 Gastro-esophageal reflux disease without esophagitis: Secondary | ICD-10-CM | POA: Diagnosis present

## 2022-01-12 DIAGNOSIS — R338 Other retention of urine: Secondary | ICD-10-CM

## 2022-01-12 DIAGNOSIS — F419 Anxiety disorder, unspecified: Secondary | ICD-10-CM | POA: Diagnosis present

## 2022-01-12 DIAGNOSIS — Z781 Physical restraint status: Secondary | ICD-10-CM

## 2022-01-12 LAB — CBC
HCT: 23.5 % — ABNORMAL LOW (ref 36.0–46.0)
HCT: 28.8 % — ABNORMAL LOW (ref 36.0–46.0)
Hemoglobin: 6.4 g/dL — CL (ref 12.0–15.0)
Hemoglobin: 7.7 g/dL — ABNORMAL LOW (ref 12.0–15.0)
MCH: 17.8 pg — ABNORMAL LOW (ref 26.0–34.0)
MCH: 17.4 pg — ABNORMAL LOW (ref 26.0–34.0)
MCHC: 27.2 g/dL — ABNORMAL LOW (ref 30.0–36.0)
MCHC: 26.7 g/dL — ABNORMAL LOW (ref 30.0–36.0)
MCV: 65.3 fL — ABNORMAL LOW (ref 80.0–100.0)
MCV: 65.2 fL — ABNORMAL LOW (ref 80.0–100.0)
Platelets: 301 10*3/uL (ref 150–400)
Platelets: 337 10*3/uL (ref 150–400)
RBC: 3.6 MIL/uL — ABNORMAL LOW (ref 3.87–5.11)
RBC: 4.42 MIL/uL (ref 3.87–5.11)
RDW: 19.9 % — ABNORMAL HIGH (ref 11.5–15.5)
RDW: 20.1 % — ABNORMAL HIGH (ref 11.5–15.5)
WBC: 6.7 10*3/uL (ref 4.0–10.5)
WBC: 6.2 10*3/uL (ref 4.0–10.5)
nRBC: 0 % (ref 0.0–0.2)
nRBC: 0 % (ref 0.0–0.2)

## 2022-01-12 LAB — I-STAT ARTERIAL BLOOD GAS, ED
Acid-base deficit: 2 mmol/L (ref 0.0–2.0)
Bicarbonate: 25 mmol/L (ref 20.0–28.0)
Calcium, Ion: 1.08 mmol/L — ABNORMAL LOW (ref 1.15–1.40)
HCT: 25 % — ABNORMAL LOW (ref 36.0–46.0)
Hemoglobin: 8.5 g/dL — ABNORMAL LOW (ref 12.0–15.0)
O2 Saturation: 99 %
Patient temperature: 96.2
Potassium: 4 mmol/L (ref 3.5–5.1)
Sodium: 141 mmol/L (ref 135–145)
TCO2: 27 mmol/L (ref 22–32)
pCO2 arterial: 49.6 mmHg — ABNORMAL HIGH (ref 32–48)
pH, Arterial: 7.304 — ABNORMAL LOW (ref 7.35–7.45)
pO2, Arterial: 180 mmHg — ABNORMAL HIGH (ref 83–108)

## 2022-01-12 LAB — ETHANOL: Alcohol, Ethyl (B): 140 mg/dL — ABNORMAL HIGH (ref <10)

## 2022-01-12 LAB — ACETAMINOPHEN LEVEL: Acetaminophen (Tylenol), Serum: <10 ug/mL — ABNORMAL LOW (ref 10–30)

## 2022-01-12 LAB — GLUCOSE, CAPILLARY
Glucose-Capillary: 206 mg/dL — ABNORMAL HIGH (ref 70–99)
Glucose-Capillary: 29 mg/dL — CL (ref 70–99)
Glucose-Capillary: 31 mg/dL — CL (ref 70–99)
Glucose-Capillary: 186 mg/dL — ABNORMAL HIGH (ref 70–99)
Glucose-Capillary: 97 mg/dL (ref 70–99)
Glucose-Capillary: 88 mg/dL (ref 70–99)

## 2022-01-12 LAB — RAPID URINE DRUG SCREEN, HOSP PERFORMED
Amphetamines: NOT DETECTED
Barbiturates: NOT DETECTED
Benzodiazepines: NOT DETECTED
Cocaine: POSITIVE — AB
Opiates: NOT DETECTED
Tetrahydrocannabinol: NOT DETECTED

## 2022-01-12 LAB — COMPREHENSIVE METABOLIC PANEL
ALT: 19 U/L (ref 0–44)
AST: 46 U/L — ABNORMAL HIGH (ref 15–41)
Albumin: 2.9 g/dL — ABNORMAL LOW (ref 3.5–5.0)
Alkaline Phosphatase: 73 U/L (ref 38–126)
Anion gap: 11 (ref 5–15)
BUN: <5 mg/dL — ABNORMAL LOW (ref 6–20)
CO2: 20 mmol/L — ABNORMAL LOW (ref 22–32)
Calcium: 7.7 mg/dL — ABNORMAL LOW (ref 8.9–10.3)
Chloride: 106 mmol/L (ref 98–111)
Creatinine, Ser: 0.43 mg/dL — ABNORMAL LOW (ref 0.44–1.00)
GFR, Estimated: >60 mL/min (ref >60)
Glucose, Bld: 193 mg/dL — ABNORMAL HIGH (ref 70–99)
Potassium: 3.8 mmol/L (ref 3.5–5.1)
Sodium: 137 mmol/L (ref 135–145)
Total Bilirubin: 0.3 mg/dL (ref 0.3–1.2)
Total Protein: 6.2 g/dL — ABNORMAL LOW (ref 6.5–8.1)

## 2022-01-12 LAB — MAGNESIUM
Magnesium: 2.2 mg/dL (ref 1.7–2.4)
Magnesium: 2 mg/dL (ref 1.7–2.4)

## 2022-01-12 LAB — PHOSPHORUS: Phosphorus: 4.9 mg/dL — ABNORMAL HIGH (ref 2.5–4.6)

## 2022-01-12 LAB — CBG MONITORING, ED: Glucose-Capillary: 189 mg/dL — ABNORMAL HIGH (ref 70–99)

## 2022-01-12 LAB — SALICYLATE LEVEL: Salicylate Lvl: <7 mg/dL — ABNORMAL LOW (ref 7.0–30.0)

## 2022-01-12 LAB — PREPARE RBC (CROSSMATCH)

## 2022-01-12 LAB — MRSA NEXT GEN BY PCR, NASAL: MRSA by PCR Next Gen: POSITIVE — AB

## 2022-01-12 MED ORDER — CHLORHEXIDINE GLUCONATE CLOTH 2 % EX PADS
6.0000 | MEDICATED_PAD | Freq: Every day | CUTANEOUS | Status: AC
  Administered 2022-01-13 – 2022-01-17 (×5): 6 via TOPICAL

## 2022-01-12 MED ORDER — FENTANYL BOLUS VIA INFUSION
100.0000 ug | INTRAVENOUS | Status: DC | PRN
  Administered 2022-01-12 – 2022-01-14 (×3): 100 ug via INTRAVENOUS

## 2022-01-12 MED ORDER — CALCIUM GLUCONATE-NACL 1-0.675 GM/50ML-% IV SOLN
1.0000 g | Freq: Once | INTRAVENOUS | Status: AC
  Administered 2022-01-12: 1000 mg via INTRAVENOUS
  Filled 2022-01-12: qty 50

## 2022-01-12 MED ORDER — DEXTROSE 50 % IV SOLN
INTRAVENOUS | Status: AC
  Administered 2022-01-12: 50 mL
  Filled 2022-01-12: qty 50

## 2022-01-12 MED ORDER — MIDAZOLAM HCL 2 MG/2ML IJ SOLN
2.0000 mg | INTRAMUSCULAR | Status: DC | PRN
Start: 2022-01-12 — End: 2022-01-14
  Administered 2022-01-12 – 2022-01-14 (×5): 2 mg via INTRAVENOUS
  Filled 2022-01-12 (×6): qty 2

## 2022-01-12 MED ORDER — PROSOURCE TF PO LIQD
45.0000 mL | Freq: Two times a day (BID) | ORAL | Status: DC
  Administered 2022-01-12 – 2022-01-17 (×10): 45 mL
  Filled 2022-01-12 (×10): qty 45

## 2022-01-12 MED ORDER — POLYETHYLENE GLYCOL 3350 17 G PO PACK
17.0000 g | PACK | Freq: Every day | ORAL | Status: DC | PRN
  Administered 2022-01-17 – 2022-01-22 (×2): 17 g
  Filled 2022-01-12 (×2): qty 1

## 2022-01-12 MED ORDER — PANTOPRAZOLE SODIUM 40 MG IV SOLR
40.0000 mg | Freq: Every day | INTRAVENOUS | Status: DC
  Administered 2022-01-12: 40 mg via INTRAVENOUS
  Filled 2022-01-12: qty 10

## 2022-01-12 MED ORDER — FENTANYL 2500MCG IN NS 250ML (10MCG/ML) PREMIX INFUSION
0.0000 ug/h | INTRAVENOUS | Status: DC
  Administered 2022-01-12: 100 ug/h via INTRAVENOUS
  Administered 2022-01-12: 200 ug/h via INTRAVENOUS
  Administered 2022-01-12: 150 ug/h via INTRAVENOUS
  Administered 2022-01-12: 50 ug/h via INTRAVENOUS
  Administered 2022-01-13 – 2022-01-14 (×3): 200 ug/h via INTRAVENOUS
  Filled 2022-01-12 (×4): qty 250

## 2022-01-12 MED ORDER — CHARCOAL ACTIVATED PO LIQD
50.0000 g | Freq: Once | ORAL | Status: AC
  Administered 2022-01-12: 50 g via ORAL
  Filled 2022-01-12: qty 240

## 2022-01-12 MED ORDER — LACTATED RINGERS IV BOLUS
1000.0000 mL | Freq: Once | INTRAVENOUS | Status: AC
  Administered 2022-01-12: 1000 mL via INTRAVENOUS

## 2022-01-12 MED ORDER — SODIUM CHLORIDE 0.9 % IV BOLUS
1000.0000 mL | Freq: Once | INTRAVENOUS | Status: AC
  Administered 2022-01-12: 1000 mL via INTRAVENOUS

## 2022-01-12 MED ORDER — MUPIROCIN 2 % EX OINT
1.0000 | TOPICAL_OINTMENT | Freq: Two times a day (BID) | CUTANEOUS | Status: AC
  Administered 2022-01-12 – 2022-01-17 (×10): 1 via NASAL
  Filled 2022-01-12 (×2): qty 22

## 2022-01-12 MED ORDER — ONDANSETRON HCL 4 MG/2ML IJ SOLN
4.0000 mg | Freq: Four times a day (QID) | INTRAMUSCULAR | Status: DC | PRN
  Administered 2022-01-21 – 2022-02-11 (×4): 4 mg via INTRAVENOUS
  Filled 2022-01-12 (×5): qty 2

## 2022-01-12 MED ORDER — FENTANYL BOLUS VIA INFUSION: 50.0000 ug | INTRAVENOUS | Status: DC | PRN

## 2022-01-12 MED ORDER — SODIUM CHLORIDE 0.9% IV SOLUTION: Freq: Once | INTRAVENOUS | Status: DC

## 2022-01-12 MED ORDER — PANTOPRAZOLE 2 MG/ML SUSPENSION
40.0000 mg | Freq: Every day | ORAL | Status: DC
Start: 2022-01-12 — End: 2022-01-13
  Administered 2022-01-12 – 2022-01-13 (×2): 40 mg
  Filled 2022-01-12 (×3): qty 20

## 2022-01-12 MED ORDER — HEPARIN SODIUM (PORCINE) 5000 UNIT/ML IJ SOLN
5000.0000 [IU] | Freq: Three times a day (TID) | INTRAMUSCULAR | Status: DC
  Administered 2022-01-12 – 2022-01-18 (×18): 5000 [IU] via SUBCUTANEOUS
  Filled 2022-01-12 (×18): qty 1

## 2022-01-12 MED ORDER — INSULIN ASPART 100 UNIT/ML IJ SOLN
0.0000 [IU] | INTRAMUSCULAR | Status: DC
  Administered 2022-01-12: 3 [IU] via SUBCUTANEOUS
  Administered 2022-01-13: 1 [IU] via SUBCUTANEOUS
  Administered 2022-01-13: 2 [IU] via SUBCUTANEOUS
  Administered 2022-01-13: 1 [IU] via SUBCUTANEOUS
  Administered 2022-01-13: 2 [IU] via SUBCUTANEOUS
  Administered 2022-01-13: 1 [IU] via SUBCUTANEOUS
  Administered 2022-01-14 (×3): 2 [IU] via SUBCUTANEOUS
  Administered 2022-01-15 (×3): 1 [IU] via SUBCUTANEOUS
  Administered 2022-01-15: 2 [IU] via SUBCUTANEOUS
  Administered 2022-01-16: 1 [IU] via SUBCUTANEOUS
  Administered 2022-01-16: 2 [IU] via SUBCUTANEOUS
  Administered 2022-01-17 (×2): 1 [IU] via SUBCUTANEOUS
  Administered 2022-01-18: 2 [IU] via SUBCUTANEOUS
  Administered 2022-01-18: 1 [IU] via SUBCUTANEOUS
  Administered 2022-01-18 – 2022-01-19 (×3): 2 [IU] via SUBCUTANEOUS
  Administered 2022-01-19: 3 [IU] via SUBCUTANEOUS
  Administered 2022-01-19: 1 [IU] via SUBCUTANEOUS
  Administered 2022-01-19: 2 [IU] via SUBCUTANEOUS
  Administered 2022-01-19: 3 [IU] via SUBCUTANEOUS
  Administered 2022-01-19: 1 [IU] via SUBCUTANEOUS
  Administered 2022-01-20 (×4): 2 [IU] via SUBCUTANEOUS
  Administered 2022-01-20: 1 [IU] via SUBCUTANEOUS
  Administered 2022-01-21 (×3): 3 [IU] via SUBCUTANEOUS
  Administered 2022-01-21 – 2022-01-22 (×4): 2 [IU] via SUBCUTANEOUS
  Administered 2022-01-22: 3 [IU] via SUBCUTANEOUS

## 2022-01-12 MED ORDER — DOCUSATE SODIUM 100 MG PO CAPS
100.0000 mg | ORAL_CAPSULE | Freq: Two times a day (BID) | ORAL | Status: DC | PRN
Start: 2022-01-12 — End: 2022-01-12

## 2022-01-12 MED ORDER — ALBUTEROL SULFATE (2.5 MG/3ML) 0.083% IN NEBU
2.5000 mg | INHALATION_SOLUTION | RESPIRATORY_TRACT | Status: DC | PRN
  Administered 2022-01-21: 2.5 mg via RESPIRATORY_TRACT
  Filled 2022-01-12: qty 3

## 2022-01-12 MED ORDER — PROPOFOL 1000 MG/100ML IV EMUL
0.0000 ug/kg/min | INTRAVENOUS | Status: DC
  Administered 2022-01-12: 20 ug/kg/min via INTRAVENOUS
  Administered 2022-01-13: 25 ug/kg/min via INTRAVENOUS
  Administered 2022-01-13: 35 ug/kg/min via INTRAVENOUS
  Administered 2022-01-13: 50 ug/kg/min via INTRAVENOUS
  Administered 2022-01-13: 35 ug/kg/min via INTRAVENOUS
  Administered 2022-01-14: 25 ug/kg/min via INTRAVENOUS
  Filled 2022-01-12 (×7): qty 100

## 2022-01-12 MED ORDER — MIDAZOLAM HCL 2 MG/2ML IJ SOLN
2.0000 mg | INTRAMUSCULAR | Status: DC | PRN
  Administered 2022-01-12: 2 mg via INTRAVENOUS
  Filled 2022-01-12: qty 2

## 2022-01-12 MED ORDER — ACETAMINOPHEN 325 MG PO TABS
650.0000 mg | ORAL_TABLET | ORAL | Status: DC | PRN
  Administered 2022-01-14 – 2022-01-26 (×11): 650 mg
  Filled 2022-01-12 (×11): qty 2

## 2022-01-12 MED ORDER — IPRATROPIUM-ALBUTEROL 0.5-2.5 (3) MG/3ML IN SOLN
3.0000 mL | Freq: Four times a day (QID) | RESPIRATORY_TRACT | Status: DC
  Administered 2022-01-12 – 2022-01-14 (×11): 3 mL via RESPIRATORY_TRACT
  Filled 2022-01-12 (×11): qty 3

## 2022-01-12 MED ORDER — VITAL HIGH PROTEIN PO LIQD
1000.0000 mL | ORAL | Status: DC
  Administered 2022-01-12: 1000 mL

## 2022-01-12 MED ORDER — ROCURONIUM BROMIDE 50 MG/5ML IV SOLN
INTRAVENOUS | Status: AC | PRN
  Administered 2022-01-12: 80 mg via INTRAVENOUS

## 2022-01-12 MED ORDER — DOCUSATE SODIUM 50 MG/5ML PO LIQD
100.0000 mg | Freq: Two times a day (BID) | ORAL | Status: DC
  Administered 2022-01-12 – 2022-01-26 (×19): 100 mg
  Filled 2022-01-12 (×22): qty 10

## 2022-01-12 MED ORDER — ETOMIDATE 2 MG/ML IV SOLN
INTRAVENOUS | Status: AC | PRN
  Administered 2022-01-12: 10 mg via INTRAVENOUS

## 2022-01-12 NOTE — TOC Initial Note (Signed)
Transition of Care [facility]) - Initial/Assessment Note    Patient Details  Name: [name] MRN: [id number] Date of Birth: 03-06-1966  [facility] of [facility]) CM/SW Contact:    Verdell Carmine, RN Phone Number: 01/12/2022, 9:33 AM  Clinical Narrative:                 Patient presented with intentional overdose of phenergan, ETOH, positive for cocaine. She is currently intubated for airway protection.  Will be admitted to ICU and receive supportive care related to symptomatology from phenergan. Psychiatry will be involved post extubation.   CM will follow for needs, recommendations, and transitions of care.     Barriers to Discharge: Continued Medical Work up   Patient Goals and CMS Choice        Expected Discharge Plan and Services     Discharge Planning Services: CM Consult   Living arrangements for the past 2 months: [place]                                      Prior Living Arrangements/Services Living arrangements for the past 2 months: [place]   Patient language and need for interpreter reviewed:: Yes        Need for Family Participation in Patient Care: Yes (Comment) Care giver support system in place?: Yes (comment)   Criminal Activity/Legal Involvement Pertinent to Current Situation/Hospitalization: No - Comment as needed  Activities of Daily Living      Permission Sought/Granted                  Emotional Assessment   Attitude/Demeanor/Rapport: Intubated (Following Commands or Not Following Commands)     Alcohol / Substance Use: Illicit Drugs, Alcohol Use Psych Involvement: Yes (comment) (once extubated)  Admission diagnosis:  Intentional overdose ([place]) [T50.902A] Patient Active Problem List   Diagnosis Date Noted   Intentional overdose ([place]) 01/12/2022   Acute respiratory failure with hypoxia ([place]) 11/14/2021   Microcytic anemia 11/14/2021   Polysubstance (excluding opioids) dependence, daily use ([place])  08/04/2018   Substance induced mood disorder ([place]) 08/03/2018   Schizoaffective disorder, bipolar type ([place]) 11/13/2017   Polysubstance abuse ([place]) 11/13/2017   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   Type 2 diabetes mellitus with diabetic polyneuropathy, with long-term current use of insulin ([place]) 09/06/2015   Essential hypertension 11/04/2011   COPD (chronic obstructive pulmonary disease) ([place]) 09/29/2011   PCP:  [name], MD Pharmacy:   [place], [address] [place] [address] Phone: [phone] Fax: [phone]     Social Determinants of Health (SDOH) Interventions    Readmission Risk Interventions    07/27/2020   11:06 AM  Readmission Risk Prevention Plan  Transportation Screening Complete  PCP or Specialist Appt within 5-7 Days Complete  Home Care Screening Complete

## 2022-01-12 NOTE — ED Notes (Signed)
Pt transported to [facility] by [facility] staff and RT.

## 2022-01-12 NOTE — ED Notes (Signed)
EDP [place] to call poison control

## 2022-01-12 NOTE — Progress Notes (Signed)
eLink Physician-Brief Progress Note Patient Name: [name] DOB: 09-Dec-1965 MRN: [id number]   Date of Service  01/12/2022  HPI/Events of Note  Reviewed KUB for Ogt placement.   OGT terminates in the stomach with the port in the midline.    RN also reported tachycardia in the 130s despite increasing sedation.  Body also seems stiff with on seizure-like activity.  Suspect these are acute dystonic reactions with muscle spasms from promethazine overdose.   eICU Interventions  Advance OGT by 5cm and ok to use.  Give versed PRN to add to propofol and fentanyl for sedation.       Intervention Category Intermediate Interventions: Diagnostic test evaluation;Other:  [name] 01/12/2022, 8:19 PM

## 2022-01-12 NOTE — H&P (Signed)
NAME:  [name], MRN:  [id number], DOB:  [birth year], LOS: 0 ADMISSION DATE:  01/12/2022, CONSULTATION DATE:  01/12/21 REFERRING MD:  Sedonia Small - EM, CHIEF COMPLAINT:  Intentional Overdose    History of Present Illness:  56 yo F PMH schizoaffective disorder (bipolar type), Depression, prior SI, polysubstance use disorder, COPD, HTN, DM presented to the ED following suicide attempt. Patient had been drinking, while with friends stated that she was going to kill herself and was witnessed ingesting a bottle of promethazine.   In transport with EMS became more somnolent. Arrived to ED with GCS 3 and was intubated in this setting. EM discussed case with poison control who recommended supportive care  PCCM consulted for admission in this setting   Pertinent  Medical History  Schizoaffective disorder, bipolar type Depression Polysubstance abuse  COPD HTN DM    Significant Hospital Events: Including procedures, antibiotic start and stop dates in addition to other pertinent events   7/23 ED after etoh consumption and intentional promethazine overdose. Intubated. Admit to ICU   Interim History / Subjective:  Intubated in ED   I shut off prop (was at 20)   Objective   Blood pressure (!) 152/83, pulse (!) 129, temperature (!) 96.2 F (35.7 C), temperature source (S) Rectal, resp. rate 20, height 5' (1.524 m), weight 42.2 kg, last menstrual period 01/08/2011, SpO2 100 %.    Vent Mode: PRVC FiO2 (%):  [40 %-100 %] 40 % Set Rate:  [18 bmp] 18 bmp Vt Set:  [360 mL] 360 mL PEEP:  [5 cmH20] 5 cmH20 Plateau Pressure:  [14 cmH20] 14 cmH20   Intake/Output Summary (Last 24 hours) at 01/12/2022 0947 Last data filed at 01/12/2022 3825 Gross per 24 hour  Intake 3.09 ml  Output --  Net 3.09 ml   Filed Weights   01/12/22 0700  Weight: 42.2 kg    Examination: General: chronically and critically ill F, appears older than documented age. Very thin and frail appearing HENT: NCAT ETT secure  anicteric sclera  Lungs: CTAb on MV symmetrical chest expansion  Cardiovascular: tachycardic, regular, cap refill brisk  Abdomen: thin flat + bowel sounds  Extremities: no acute joint deformity  Neuro: recent sedation. Pupils are 64m sluggish. Does not respond to pain. No cough.  GU: defer   Resolved Hospital Problem list     Assessment & Plan:   Acute toxic metabolic encephalopathy in setting of intentional overdose P -supportive care -- anticipate mentation will improve as metabolizes meds  -minimize CNS depressing meds  -RASS goal 0 to -1  Suicidal Ideation with intentional overdose of promethazine  Schizoaffective disorder, Bipolar type Depression  Polysubstance abuse P -when clinically appropriate, psych consult -EM has d/w poison control, giving activated charcoal  -supportive care -monitor qtc -UDS pending   Acute respiratory failure in setting of intentional overdose Hx COPD, without acute exacerbation  Tobacco use disorder P -CXR, ABG  -wean MV as able -VAP, pulm hygiene -Bds  Hx HTN -on dilt for this at home P -holding home meds for now   Acute on chronic microcytic anemia -notable hx of allergic rxn to iron transfusion previously  P -recheck H/H  -send a type and screen   Dm2 with hyperglycemia P -SSI    Best Practice (right click and "Reselect all SmartList Selections" daily)   Diet/type: NPO DVT prophylaxis: prophylactic heparin  GI prophylaxis: PPI Lines: N/A Foley:  N/A Code Status:  full code Last date of multidisciplinary goals of care discussion [  pending ]  Labs   CBC: Recent Labs  Lab 01/12/22 0622 01/12/22 0855  WBC 6.7  --   HGB 6.4* 8.5*  HCT 23.5* 25.0*  MCV 65.3*  --   PLT 301  --     Basic Metabolic Panel: Recent Labs  Lab 01/12/22 0622 01/12/22 0855  NA 137 141  K 3.8 4.0  CL 106  --   CO2 20*  --   GLUCOSE 193*  --   BUN <5*  --   CREATININE 0.43*  --   CALCIUM 7.7*  --    GFR: Estimated Creatinine  Clearance: 51.7 mL/min (A) (by C-G formula based on SCr of 0.43 mg/dL (L)). Recent Labs  Lab 01/12/22 0622  WBC 6.7    Liver Function Tests: Recent Labs  Lab 01/12/22 0622  AST 46*  ALT 19  ALKPHOS 73  BILITOT 0.3  PROT 6.2*  ALBUMIN 2.9*   No results for input(s): "LIPASE", "AMYLASE" in the last 168 hours. No results for input(s): "AMMONIA" in the last 168 hours.  ABG    Component Value Date/Time   PHART 7.304 (L) 01/12/2022 0855   PCO2ART 49.6 (H) 01/12/2022 0855   PO2ART 180 (H) 01/12/2022 0855   HCO3 25.0 01/12/2022 0855   TCO2 27 01/12/2022 0855   ACIDBASEDEF 2.0 01/12/2022 0855   O2SAT 99 01/12/2022 0855     Coagulation Profile: No results for input(s): "INR", "PROTIME" in the last 168 hours.  Cardiac Enzymes: No results for input(s): "CKTOTAL", "CKMB", "CKMBINDEX", "TROPONINI" in the last 168 hours.  HbA1C: Hgb A1c MFr Bld  Date/Time Value Ref Range Status  11/14/2021 05:32 AM 7.4 (H) 4.8 - 5.6 % Final    Comment:    (NOTE) Pre diabetes:          5.7%-6.4%  Diabetes:              >6.4%  Glycemic control for   <7.0% adults with diabetes   07/25/2020 02:12 AM 7.6 (H) 4.8 - 5.6 % Final    Comment:    (NOTE) Pre diabetes:          5.7%-6.4%  Diabetes:              >6.4%  Glycemic control for   <7.0% adults with diabetes     CBG: Recent Labs  Lab 01/12/22 0622  GLUCAP 189*    Review of Systems:   Unable to obtain, intubated and sedate   Past Medical History:  She,  has a past medical history of Abdominal pain, Accidental drug overdose (April 2013), Anxiety, Atrial fibrillation ([place]) (09/29/11), Chronic back pain, Chronic knee pain, Chronic nausea, Chronic pain, COPD (chronic obstructive pulmonary disease) ([place]), Depression, Diabetes mellitus, Diabetic neuropathy ([place]), Dyspnea, GERD (gastroesophageal reflux disease), Headache(784.0), HTN (hypertension), Hyperlipidemia, Hypothyroidism, Mental disorder, Requires supplemental oxygen,  Schizophrenia ([place]), Schizophrenia, acute ([place]) (11/13/2017), and Tobacco abuse.   Surgical History:   Past Surgical History:  Procedure Laterality Date   ABDOMINAL HYSTERECTOMY     BLADDER SUSPENSION  03/04/2011   Procedure: [facility] PROCEDURE;  Surgeon: Elayne Snare MacDiarmid;  Location: [facility];  Service: Urology;  Laterality: N/A;   CYSTOCELE REPAIR  03/04/2011   Procedure: ANTERIOR REPAIR (CYSTOCELE);  Surgeon: Reece Packer;  Location: [facility];  Service: Urology;  Laterality: N/A;   CYSTOSCOPY  03/04/2011   Procedure: CYSTOSCOPY;  Surgeon: Elayne Snare MacDiarmid;  Location: [facility];  Service: Urology;  Laterality: N/A;   ESOPHAGOGASTRODUODENOSCOPY (EGD) WITH PROPOFOL N/A 05/12/2017  Procedure: ESOPHAGOGASTRODUODENOSCOPY (EGD) WITH PROPOFOL;  Surgeon: Alphonsa Overall, MD;  Location: [facility];  Service: General;  Laterality: N/A;   GASTRIC ROUX-EN-Y N/A 03/25/2016   Procedure: LAPAROSCOPIC ROUX-EN-Y GASTRIC BYPASS WITH UPPER ENDOSCOPY;  Surgeon: Excell Seltzer, MD;  Location: [facility];  Service: General;  Laterality: N/A;   KNEE SURGERY     LAPAROSCOPIC ASSISTED VAGINAL HYSTERECTOMY  03/04/2011   Procedure: LAPAROSCOPIC ASSISTED VAGINAL HYSTERECTOMY;  Surgeon: Cyril Mourning, MD;  Location: [facility];  Service: Gynecology;  Laterality: N/A;     Social History:   reports that she has been smoking cigarettes. She has been smoking an average of .5 packs per day. She has never used smokeless tobacco. She reports current alcohol use. She reports current drug use. Drugs: Cocaine, Marijuana, and "Crack" cocaine.   Family History:  Her family history includes COPD in an other family member; Diabetes in her mother; Heart attack in her father and sister; Heart disease in her mother; Hypertension in her mother. There is no history of Breast cancer.   Allergies Allergies  Allergen Reactions   Iron Dextran Shortness Of Breath and Anxiety   Aspirin Nausea And Vomiting and Other (See Comments)    Ok to  take tylenol or ibuprofen      Home Medications  Prior to Admission medications   Medication Sig Start Date End Date Taking? Authorizing Provider  ABILIFY MAINTENA 400 MG PRSY prefilled syringe Inject 400 mg as directed every 28 (twenty-eight) days. Patient not taking: Reported on 11/13/2021 07/12/18   [provider]  albuterol (PROAIR HFA) 108 (90 Base) MCG/ACT inhaler Inhale 2 puffs into the lungs every 6 (six) hours as needed for wheezing or shortness of breath. 11/16/17   Lindell Spar I, NP  ALPRAZolam Duanne Moron) 1 MG tablet Take 1 tablet (1 mg total) by mouth 2 (two) times daily as needed for anxiety. Patient taking differently: Take 1 mg by mouth in the morning, at noon, and at bedtime. 07/27/20   Florencia Reasons, MD  amoxicillin-clavulanate (AUGMENTIN) 875-125 MG tablet Take 1 tablet by mouth 2 (two) times daily. 11/16/21   Thurnell Lose, MD  blood glucose meter kit and supplies KIT Dispense based on patient and insurance preference. Use up to four times daily as directed. (FOR ICD-9 250.00, 250.01). 07/27/20   Florencia Reasons, MD  CAPLYTA 42 MG capsule Take 42 mg by mouth at bedtime. Patient not taking: Reported on 11/13/2021 06/12/21   [provider]  Cholecalciferol 25 MCG (1000 UT) capsule Take 1 capsule (1,000 Units total) by mouth daily. Patient not taking: Reported on 11/13/2021 08/03/20   Florencia Reasons, MD  diltiazem (CARDIZEM) 60 MG tablet Take 1 tablet (60 mg total) by mouth every 12 (twelve) hours. 11/16/21   Thurnell Lose, MD  ferrous sulfate 325 (65 FE) MG tablet Take 1 tablet (325 mg total) by mouth daily with breakfast. 11/16/21   Thurnell Lose, MD  guaiFENesin (MUCINEX) 600 MG 12 hr tablet Take 1 tablet (600 mg total) by mouth 2 (two) times daily. Patient not taking: Reported on 11/13/2021 07/27/20   Florencia Reasons, MD  insulin aspart (NOVOLOG) 100 UNIT/ML FlexPen Insulin sliding scale: Blood sugar  120-150   3units                       151-200   4units  201-250   7units                       251- 300  11units                       301-350   15uints                       351-400   20units                       >400         call MD immediately Patient not taking: Reported on 11/12/2021 07/27/20   Florencia Reasons, MD  Insulin Pen Needle 29G X 12MM MISC novolog sliding scale insulin three times a day as needed 07/27/20   Florencia Reasons, MD  metFORMIN (GLUCOPHAGE) 500 MG tablet Take 1 tablet (500 mg total) by mouth 2 (two) times daily with a meal. 11/16/21 12/16/21  Thurnell Lose, MD  methylPREDNISolone (MEDROL DOSEPAK) 4 MG TBPK tablet follow package directions 11/16/21   Thurnell Lose, MD  Multiple Vitamin (MULTIVITAMIN WITH MINERALS) TABS tablet Take 1 tablet by mouth 2 (two) times daily. Patient not taking: Reported on 11/13/2021 07/27/20   Florencia Reasons, MD  nicotine (NICODERM CQ - DOSED IN MG/24 HOURS) 21 mg/24hr patch Place 1 patch (21 mg total) onto the skin daily. (May purchase from over the counter): For nicotine withdrawal symptoms 11/16/21   Thurnell Lose, MD  pantoprazole (PROTONIX) 40 MG tablet Take 1 tablet (40 mg total) by mouth daily. 11/16/21   Thurnell Lose, MD  SUMAtriptan 6 MG/0.5ML SOAJ Inject 6 mg into the muscle daily as needed for migraine. 11/01/21   [provider]  SYMBICORT 160-4.5 MCG/ACT inhaler Inhale 2 puffs into the lungs daily as needed (sob and wheezing). Patient not taking: Reported on 11/13/2021 11/16/17   Lindell Spar I, NP     Critical care time: 50 minutes      CRITICAL CARE Performed by: Cristal Generous   Total critical care time: 50 minutes  Critical care time was exclusive of separately billable procedures and treating other patients. Critical care was necessary to treat or prevent imminent or life-threatening deterioration.  Critical care was time spent personally by me on the following activities: development of treatment plan with patient and/or surrogate as well as nursing, discussions with  consultants, evaluation of patient's response to treatment, examination of patient, obtaining history from patient or surrogate, ordering and performing treatments and interventions, ordering and review of laboratory studies, ordering and review of radiographic studies, pulse oximetry and re-evaluation of patient's condition.  Eliseo Gum MSN, AGACNP-BC [place] for pager  01/12/2022, 9:47 AM

## 2022-01-12 NOTE — ED Triage Notes (Addendum)
Pt BIB [facility] from home. EMS called by friends following a suicide attempt. EMS states friends reported she was going to kills herself today. Reported large amount of alcohol (beers) and found have taken a 30 day supply of 25 mg promethazine likely around 0535 am (EMS called).   Pt arrives decreased responsiveness RT and EDP at bedside. Received 400 mL prior to arrival.

## 2022-01-12 NOTE — ED Notes (Signed)
Sitter at bedside.

## 2022-01-12 NOTE — ED Provider Notes (Signed)
[facility] Emergency Department Provider Note MRN:  [id number]  Arrival date & time: 01/12/22     Chief Complaint   Drug Overdose and Suicide Attempt   History of Present Illness   [name] is a 56 y.o. year-old female with a history of schizoaffective disorder, diabetes presenting to the ED with chief complaint of drug overdose.  Reportedly patient was drinking alcohol with friends and then suddenly declared that she was going to kill herself and then took an entire pill bottle of promethazine.  She has become more more somnolent with EMS, minimally responsive.  Review of Systems  I was unable to obtain a full/accurate HPI, PMH, or ROS due to the patient's altered mental status.  Patient's Health History    Past Medical History:  Diagnosis Date   Abdominal pain    Accidental drug overdose April 2013   Anxiety    Atrial fibrillation ([place]) 09/29/11   converted spontaneously   Chronic back pain    Chronic knee pain    Chronic nausea    Chronic pain    COPD (chronic obstructive pulmonary disease) ([place])    Depression    Diabetes mellitus    states her doctor took her off all DM meds in past month   Diabetic neuropathy ([place])    Dyspnea    with exertion    GERD (gastroesophageal reflux disease)    Headache(784.0)    migraines    HTN (hypertension)    not on meds since in a year    Hyperlipidemia    Hypothyroidism    not on meds in a while    Mental disorder    Bipolar and schizophrenic   Requires supplemental oxygen    as needed per patient    Schizophrenia ([place])    Schizophrenia, acute ([place]) 11/13/2017   Tobacco abuse     Past Surgical History:  Procedure Laterality Date   ABDOMINAL HYSTERECTOMY     BLADDER SUSPENSION  03/04/2011   Procedure: [facility] PROCEDURE;  Surgeon: Elayne Snare MacDiarmid;  Location: [facility];  Service: Urology;  Laterality: N/A;   CYSTOCELE REPAIR  03/04/2011   Procedure: ANTERIOR REPAIR (CYSTOCELE);  Surgeon: Reece Packer;  Location: [facility];  Service: Urology;  Laterality: N/A;   CYSTOSCOPY  03/04/2011   Procedure: CYSTOSCOPY;  Surgeon: Elayne Snare MacDiarmid;  Location: [facility];  Service: Urology;  Laterality: N/A;   ESOPHAGOGASTRODUODENOSCOPY (EGD) WITH PROPOFOL N/A 05/12/2017   Procedure: ESOPHAGOGASTRODUODENOSCOPY (EGD) WITH PROPOFOL;  Surgeon: Alphonsa Overall, MD;  Location: [facility];  Service: General;  Laterality: N/A;   GASTRIC ROUX-EN-Y N/A 03/25/2016   Procedure: LAPAROSCOPIC ROUX-EN-Y GASTRIC BYPASS WITH UPPER ENDOSCOPY;  Surgeon: Excell Seltzer, MD;  Location: [facility];  Service: General;  Laterality: N/A;   KNEE SURGERY     LAPAROSCOPIC ASSISTED VAGINAL HYSTERECTOMY  03/04/2011   Procedure: LAPAROSCOPIC ASSISTED VAGINAL HYSTERECTOMY;  Surgeon: Cyril Mourning, MD;  Location: [facility];  Service: Gynecology;  Laterality: N/A;    Family History  Problem Relation Age of Onset   Heart attack Father        74s   Diabetes Mother    Heart disease Mother    Hypertension Mother    Heart attack Sister        84   COPD Other    Breast cancer Neg Hx     Social History   Socioeconomic History   Marital status: Legally Separated    Spouse name: Not on file  Number of children: Not on file   Years of education: Not on file   Highest education level: Not on file  Occupational History   Not on file  Tobacco Use   Smoking status: Every Day    Packs/day: 0.50    Types: Cigarettes   Smokeless tobacco: Never  Vaping Use   Vaping Use: Never used  Substance and Sexual Activity   Alcohol use: Yes    Comment: daily   Drug use: Yes    Types: Cocaine, Marijuana, "Crack" cocaine   Sexual activity: Yes    Partners: Male  Other Topics Concern   Not on file  Social History Narrative   Not on file   Social Determinants of Health   Financial Resource Strain: Not on file  Food Insecurity: Not on file  Transportation Needs: Not on file  Physical Activity: Not on file  Stress: Not on file   Social Connections: Not on file  Intimate Partner Violence: Not on file     Physical Exam   Vitals:   01/12/22 0643 01/12/22 0700  BP:  (!) 157/88  Pulse:  (!) 118  Resp:  17  Temp: (!) 96.2 F (35.7 C)   SpO2:  100%    CONSTITUTIONAL: Ill-appearing, NAD NEURO/PSYCH: No response to painful stimulus EYES:  eyes equal and reactive ENT/NECK:  no LAD, no JVD CARDIO: Tachycardic rate, well-perfused, normal S1 and S2 PULM:  CTAB no wheezing or rhonchi GI/GU:  non-distended, non-tender MSK/SPINE:  No gross deformities, no edema SKIN:  no rash, atraumatic   *Additional and/or pertinent findings included in MDM below  Diagnostic and Interventional Summary    EKG Interpretation  Date/Time:  Sunday January 12 2022 06:23:16 EDT Ventricular Rate:  124 PR Interval:  130 QRS Duration: 93 QT Interval:  317 QTC Calculation: 456 R Axis:   75 Text Interpretation: Sinus tachycardia Ventricular premature complex Aberrant complex LAE, consider biatrial enlargement RSR' in V1 or V2, probably normal variant Confirmed by Gerlene Fee [phone]) on 01/12/2022 6:42:08 AM       Labs Reviewed  CBG MONITORING, ED - Abnormal; Notable for the following components:      Result Value   Glucose-Capillary 189 (*)    All other components within normal limits  COMPREHENSIVE METABOLIC PANEL  ETHANOL  SALICYLATE LEVEL  ACETAMINOPHEN LEVEL  CBC  RAPID URINE DRUG SCREEN, HOSP PERFORMED    DG Abdomen 1 View  Final Result    DG Chest Portable 1 View  Final Result      Medications  propofol (DIPRIVAN) 1000 MG/100ML infusion (has no administration in time range)  etomidate (AMIDATE) injection (10 mg Intravenous Given 01/12/22 0623)  rocuronium (ZEMURON) injection (80 mg Intravenous Given 01/12/22 0623)  sodium chloride 0.9 % bolus 1,000 mL (0 mLs Intravenous Stopped 01/12/22 0701)  charcoal activated (NO SORBITOL) (ACTIDOSE-AQUA) suspension 50 g (50 g Oral Given 01/12/22 0700)     Procedures  /   Critical Care .Critical Care  Performed by: Maudie Flakes, MD Authorized by: Maudie Flakes, MD   Critical care provider statement:    Critical care time (minutes):  45   Critical care was necessary to treat or prevent imminent or life-threatening deterioration of the following conditions:  [place]   Critical care was time spent personally by me on the following activities:  Development of treatment plan with patient or surrogate, discussions with consultants, evaluation of patient's response to treatment, examination of patient, ordering and review of laboratory studies, ordering  and review of radiographic studies, ordering and performing treatments and interventions, pulse oximetry, re-evaluation of patient's condition and review of old charts Procedure Name: Intubation Date/Time: 01/12/2022 6:42 AM  Performed by: Maudie Flakes, MDPre-anesthesia Checklist: Patient identified, Patient being monitored, Emergency Drugs available, Timeout performed and Suction available Oxygen Delivery Method: Non-rebreather mask Preoxygenation: Pre-oxygenation with 100% oxygen Induction Type: Rapid sequence Ventilation: Mask ventilation without difficulty Laryngoscope Size: Mac and 4 Grade View: Grade I Tube size: 7.5 mm Number of attempts: 1 Airway Equipment and Method: Stylet Placement Confirmation: ETT inserted through vocal cords under direct vision, CO2 detector and Breath sounds checked- equal and bilateral Secured at: 23 cm Tube secured with: ETT holder Comments: Uncomplicated RSI using 10 mg etomidate, 80 mg rocuronium      ED Course and Medical Decision Making  Initial Impression and Ddx Intentional overdose with promethazine, severely diminished mental status with GCS of 3, concern for airway protection, intubated as described above.  Hemodynamically seems to be doing well, mildly tachycardic, hypertensive.  Per poison control, depressed mental status and respiratory depression are  expected, also mild anticholinergic picture can occur.  Management is symptomatic.  Awaiting labs, will admit to ICU.  Past medical/surgical history that increases complexity of ED encounter: Schizophrenia  Interpretation of Diagnostics I personally reviewed the EKG and my interpretation is as follows: Sinus tachycardia  Labs pending  Patient Reassessment and Ultimate Disposition/Management     Patient accepted to the ICU for further care.  Providing charcoal via NG tube per poison control recommendations.  Patient management required discussion with the following services or consulting groups:  Intensivist Service  Complexity of Problems Addressed Acute illness or injury that poses threat of life of bodily function  Additional Data Reviewed and Analyzed Further history obtained from: EMS on arrival  Additional Factors Impacting ED Encounter Risk Consideration of hospitalization  Barth Kirks. [place]onia Small, [place] [email or web address]  Final Clinical Impressions(s) / ED Diagnoses     ICD-10-CM   1. Intentional overdose, initial encounter ([place])  T50.902A     2. Obtunded  R40.1       ED Discharge Orders     None        Discharge Instructions Discussed with and Provided to Patient:   Discharge Instructions   None      Maudie Flakes, MD 01/12/22 [phone]

## 2022-01-12 NOTE — ED Notes (Signed)
Portable XR at bedside

## 2022-01-13 ENCOUNTER — Inpatient Hospital Stay (HOSPITAL_COMMUNITY): Payer: Medicare Other

## 2022-01-13 DIAGNOSIS — R401 Stupor: Secondary | ICD-10-CM

## 2022-01-13 DIAGNOSIS — R4182 Altered mental status, unspecified: Secondary | ICD-10-CM

## 2022-01-13 DIAGNOSIS — T50902A Poisoning by unspecified drugs, medicaments and biological substances, intentional self-harm, initial encounter: Secondary | ICD-10-CM | POA: Diagnosis not present

## 2022-01-13 LAB — CBC
HCT: 22.3 % — ABNORMAL LOW (ref 36.0–46.0)
Hemoglobin: 6.2 g/dL — CL (ref 12.0–15.0)
MCH: 17.8 pg — ABNORMAL LOW (ref 26.0–34.0)
MCHC: 27.8 g/dL — ABNORMAL LOW (ref 30.0–36.0)
MCV: 63.9 fL — ABNORMAL LOW (ref 80.0–100.0)
Platelets: 287 10*3/uL (ref 150–400)
RBC: 3.49 MIL/uL — ABNORMAL LOW (ref 3.87–5.11)
RDW: 19.7 % — ABNORMAL HIGH (ref 11.5–15.5)
WBC: 16.2 10*3/uL — ABNORMAL HIGH (ref 4.0–10.5)
nRBC: 0 % (ref 0.0–0.2)

## 2022-01-13 LAB — COMPREHENSIVE METABOLIC PANEL
ALT: 20 U/L (ref 0–44)
AST: 28 U/L (ref 15–41)
Albumin: 2.5 g/dL — ABNORMAL LOW (ref 3.5–5.0)
Alkaline Phosphatase: 63 U/L (ref 38–126)
Anion gap: 11 (ref 5–15)
BUN: 8 mg/dL (ref 6–20)
CO2: 22 mmol/L (ref 22–32)
Calcium: 7.6 mg/dL — ABNORMAL LOW (ref 8.9–10.3)
Chloride: 109 mmol/L (ref 98–111)
Creatinine, Ser: 0.49 mg/dL (ref 0.44–1.00)
GFR, Estimated: >60 mL/min (ref >60)
Glucose, Bld: 72 mg/dL (ref 70–99)
Potassium: 3.7 mmol/L (ref 3.5–5.1)
Sodium: 142 mmol/L (ref 135–145)
Total Bilirubin: 0.6 mg/dL (ref 0.3–1.2)
Total Protein: 5.5 g/dL — ABNORMAL LOW (ref 6.5–8.1)

## 2022-01-13 LAB — GLUCOSE, CAPILLARY
Glucose-Capillary: 129 mg/dL — ABNORMAL HIGH (ref 70–99)
Glucose-Capillary: 134 mg/dL — ABNORMAL HIGH (ref 70–99)
Glucose-Capillary: 147 mg/dL — ABNORMAL HIGH (ref 70–99)
Glucose-Capillary: 193 mg/dL — ABNORMAL HIGH (ref 70–99)
Glucose-Capillary: 216 mg/dL — ABNORMAL HIGH (ref 70–99)
Glucose-Capillary: 75 mg/dL (ref 70–99)
Glucose-Capillary: 98 mg/dL (ref 70–99)

## 2022-01-13 LAB — PREPARE RBC (CROSSMATCH)

## 2022-01-13 LAB — C-REACTIVE PROTEIN: CRP: 9.1 mg/dL — ABNORMAL HIGH (ref <1.0)

## 2022-01-13 LAB — IRON AND TIBC
Iron: 9 ug/dL — ABNORMAL LOW (ref 28–170)
Saturation Ratios: 2 % — ABNORMAL LOW (ref 10.4–31.8)
TIBC: 510 ug/dL — ABNORMAL HIGH (ref 250–450)
UIBC: 501 ug/dL

## 2022-01-13 LAB — PHOSPHORUS
Phosphorus: 5 mg/dL — ABNORMAL HIGH (ref 2.5–4.6)
Phosphorus: 4.2 mg/dL (ref 2.5–4.6)

## 2022-01-13 LAB — MAGNESIUM
Magnesium: 2 mg/dL (ref 1.7–2.4)
Magnesium: 2.3 mg/dL (ref 1.7–2.4)

## 2022-01-13 LAB — RETICULOCYTES
Immature Retic Fract: 13.7 % (ref 2.3–15.9)
RBC.: 3.5 MIL/uL — ABNORMAL LOW (ref 3.87–5.11)
Retic Count, Absolute: 25.6 10*3/uL (ref 19.0–186.0)
Retic Ct Pct: 0.7 % (ref 0.4–3.1)

## 2022-01-13 LAB — FOLATE: Folate: 9 ng/mL (ref >5.9)

## 2022-01-13 LAB — TRANSFERRIN: Transferrin: 364 mg/dL (ref 192–382)

## 2022-01-13 LAB — VITAMIN B12: Vitamin B-12: 193 pg/mL (ref 180–914)

## 2022-01-13 LAB — VITAMIN D 25 HYDROXY (VIT D DEFICIENCY, FRACTURES): Vit D, 25-Hydroxy: 10.32 ng/mL — ABNORMAL LOW (ref 30–100)

## 2022-01-13 LAB — FERRITIN: Ferritin: 5 ng/mL — ABNORMAL LOW (ref 11–307)

## 2022-01-13 MED ORDER — THIAMINE HCL 100 MG PO TABS
100.0000 mg | ORAL_TABLET | Freq: Every day | ORAL | Status: DC
Start: 2022-01-13 — End: 2022-01-14
  Administered 2022-01-13 – 2022-01-14 (×2): 100 mg
  Filled 2022-01-13 (×2): qty 1

## 2022-01-13 MED ORDER — OSMOLITE 1.2 CAL PO LIQD
1000.0000 mL | ORAL | Status: DC
  Administered 2022-01-13: 1000 mL
  Filled 2022-01-13 (×2): qty 1000

## 2022-01-13 MED ORDER — ADULT MULTIVITAMIN W/MINERALS CH
1.0000 | ORAL_TABLET | Freq: Two times a day (BID) | ORAL | Status: DC
  Administered 2022-01-13 – 2022-01-17 (×9): 1
  Filled 2022-01-13 (×10): qty 1

## 2022-01-13 MED ORDER — PANTOPRAZOLE SODIUM 40 MG IV SOLR
40.0000 mg | Freq: Two times a day (BID) | INTRAVENOUS | Status: DC
  Administered 2022-01-13 – 2022-01-14 (×2): 40 mg via INTRAVENOUS
  Filled 2022-01-13 (×3): qty 10

## 2022-01-13 MED ORDER — ORAL CARE MOUTH RINSE: 15.0000 mL | OROMUCOSAL | Status: DC | PRN

## 2022-01-13 MED ORDER — SODIUM CHLORIDE 0.9% IV SOLUTION: Freq: Once | INTRAVENOUS | Status: DC

## 2022-01-13 MED ORDER — ORAL CARE MOUTH RINSE
15.0000 mL | OROMUCOSAL | Status: DC
  Administered 2022-01-13 – 2022-01-14 (×7): 15 mL via OROMUCOSAL

## 2022-01-13 NOTE — Progress Notes (Signed)
NAME:  [name], MRN:  [id number], DOB:  April 04, 1966, LOS: 1 ADMISSION DATE:  01/12/2022, CONSULTATION DATE:  01/12/21 REFERRING MD:  Sedonia Small - EM, CHIEF COMPLAINT:  Intentional Overdose    History of Present Illness:  56 yo F PMH schizoaffective disorder (bipolar type), Depression, prior SI, polysubstance use disorder, COPD, HTN, DM presented to the ED following suicide attempt. Patient had been drinking, while with friends stated that she was going to kill herself and was witnessed ingesting a bottle of promethazine.   In transport with EMS became more somnolent. Arrived to ED with GCS 3 and was intubated in this setting. EM discussed case with poison control who recommended supportive care  PCCM consulted for admission in this setting.  Pertinent Medical History:  Schizoaffective disorder, bipolar type Depression Polysubstance abuse  COPD HTN DM    Significant Hospital Events: Including procedures, antibiotic start and stop dates in addition to other pertinent events   7/23 ED after etoh consumption and intentional promethazine overdose. Intubated. Admit to ICU  7/24 Tachycardic to 130s overnight, also with episode of whole body stiffening, ?dystonia vs. seizure.  Interim History / Subjective:  Overnight, episode of tachycardia to 130s with concomitant body stiffening No other significant events Minimally responsive on Fentanyl 262mg, Prop 30 this AM Reportedly becomes quite agitated with sedation weaning Plan to keep sedated today allow phenergan to clear, obtain EEG Wean sedation and assess neuro status 7/25  Objective:  Blood pressure 105/60, pulse (!) 103, temperature (!) 100.6 F (38.1 C), temperature source Oral, resp. rate 18, height 5' (1.524 m), weight 42.8 kg, last menstrual period 01/08/2011, SpO2 95 %.    Vent Mode: PRVC FiO2 (%):  [40 %] 40 % Set Rate:  [18 bmp] 18 bmp Vt Set:  [360 mL] 360 mL PEEP:  [5 cmH20] 5 cmH20 Plateau Pressure:  [13 cmH20-18 cmH20]  18 cmH20   Intake/Output Summary (Last 24 hours) at 01/13/2022 0715 Last data filed at 01/13/2022 0600 Gross per 24 hour  Intake 1758.41 ml  Output 4020 ml  Net -2261.59 ml    Filed Weights   01/12/22 0700 01/13/22 0500  Weight: 42.2 kg 42.8 kg   Physical Examination: General: Chronically ill-appearing middle-aged woman in NAD, intubated/sedated. HEENT: [place]/AT, anicteric sclera, PERRL 266m moist mucous membranes. ETT/OGT in place. Neuro: Sedated. Does not respond to verbal, tactile or noxious stimuli. Withdraws to pain in lower extremities, flicker to pain in upper extremities. Not following commands. No spontaneous movement of extremities noted on exam. +Cough and +Gag  CV: Tachycardic, regular rhythm, no m/g/r. PULM: Breathing even and unlabored on vent (PEEP 5, FiO2 40%). Lung fields c/w mechanical breath sounds. GI: Soft, nontender, mildly distended. Normoactive bowel sounds. Extremities: No LE edema noted. Skin: Warm/dry, no rashes; mild ecchymosis to L shoulder.  Resolved Hospital Problem List:     Assessment & Plan:   Acute toxic metabolic encephalopathy in setting of intentional overdose Remote seizure history - Monitor, supportive care as ingested medication clears - Minimizing CNS-depressing meds as able, per Poison Control - RASS goal 0 to -1 - Obtain spot EEG for ?dystonic episode vs. seizure overnight - Consider Neuro consult  Suicidal Ideation with intentional overdose of promethazine  Schizoaffective disorder, Bipolar type Depression  Polysubstance abuse (crack cocaine, EtOH) - S/p activated charcoal administered in ED - UDS +cocaine, Ethanol 140 - Monitor QTc - Trend BMP, LFTs - Psychiatric consult when patient able to participate in care  Acute respiratory failure in setting of intentional  overdose Hx COPD, without acute exacerbation  Tobacco use disorder P - Continue full vent support (4-8cc/kg IBW) - Wean FiO2 for O2 sat > 90% - Daily WUA/SBT,  currently precluded by mental status/sedation - VAP bundle - Pulmonary hygiene - PAD protocol for sedation: Propofol and Fentanyl for goal RASS 0 to -1  Hx HTN On dilt for this at home - Hold hold antihypertensives at present - Resume as clinically appropriate - Cardiac monitoring  Acute-on-chronic microcytic anemia Notable hx of allergic rxn to iron transfusion previously  - Trend H&H - Monitor for signs of active bleeding - Transfuse for Hgb < 7.0 or hemodynamically significant bleeding  T2DM with hyperglycemia - SSI - CBGs Q4H - Goal CBG 140-180  Best Practice (right click and "Reselect all SmartList Selections" daily)   Diet/type: NPO DVT prophylaxis: prophylactic heparin  GI prophylaxis: PPI Lines: N/A Foley:  N/A Code Status:  full code Last date of multidisciplinary goals of care discussion [pending ]  Critical care time: 41 minutes   The patient is critically ill with multiple organ system failure and requires high complexity decision making for assessment and support, frequent evaluation and titration of therapies, advanced monitoring, review of radiographic studies and interpretation of complex data.   Critical Care Time devoted to patient care services, exclusive of separately billable procedures, described in this note is 41 minutes.  Lestine Mount, PA-C [facility] 01/13/22 7:16 AM  Please see [email or web address] for pager details.  From 7A-7P if no response, please call [phone] After hours, please call ELink [phone]

## 2022-01-13 NOTE — Progress Notes (Signed)
Initial Nutrition Assessment  DOCUMENTATION CODES:   Underweight, Severe malnutrition in context of chronic illness  INTERVENTION:   Tube Feeding via OG:  Osmolite 1.2 at 55 ml/hr Pro-source TF 45 mL daily This provides 84 g of protein, 1624 kcals and 1069 mL of free water  Pt is at risk for refeeding but electrolytes so far ok  Repeat abd xray to confirm OG placement given hx of gastric bypass. Pt may need Cortrak  Check for vitamin mineral deficiencies given for significant wt loss post bariatric surgery, pt now underweight and malnourished. Hx of Vitamin Def. Check CRP, Vit A, Vit D, thiamine, B12, folate, copper, zinc   NUTRITION DIAGNOSIS:   Severe Malnutrition related to chronic illness (hx gastric bypass, COPD, polysubtance abuse) as evidenced by severe fat depletion, severe muscle depletion.  GOAL:   Patient will meet greater than or equal to 90% of their needs  MONITOR:   TF tolerance, Vent status, Labs, Weight trends  REASON FOR ASSESSMENT:   Consult, Ventilator Enteral/tube feeding initiation and management  ASSESSMENT:   56 yo female admitted with acute encephalopathy in setting of intentional OD with SI, intubated for airway protection. PMH includes COPD, HTN, DM, schizoaffective disorder, depression. Pt with hx of gastric bypass in 2017, sig weight loss  7/23 Admitted, Intubated  Vital High Protein at 40 ml/hr started via Adult TF protocol yesterday 7/23 via OG  Moderate stool burden in colon per abd xray. Overnight, ELink PCCM evaluated abd xray for OG placement and recommended advancement by 5 cm then ok for use. Noted hx gastric bypass; recommend repeat xray to confirm tube is not in correct location.   Current wt 42.8 kg ; noted weight in May 42.2 kg but prior to this pt with sig wt loss over many years  Noted in 2017 pt with workup for bariatric surgery with BMI 40 and underwent Gastric Roux-En-Y, Current BMI <18.5  Per social history, pt is an  every day smoker and EtOH drinker. Pt also utilizes cocaine and marijuna  Labs: phosphorus 5.0, CBGs 29-206, potassium 3.7, magnesium 2.0 Meds: ss novolog, colace   NUTRITION - FOCUSED PHYSICAL EXAM:  Flowsheet Row Most Recent Value  Orbital Region Moderate depletion  Upper Arm Region Severe depletion  Thoracic and Lumbar Region Severe depletion  Buccal Region Unable to assess  Temple Region Moderate depletion  Clavicle Bone Region Moderate depletion  Clavicle and Acromion Bone Region Moderate depletion  Scapular Bone Region Moderate depletion  Dorsal Hand Unable to assess  Patellar Region Severe depletion  Anterior Thigh Region Severe depletion  Posterior Calf Region Severe depletion  Edema (RD Assessment) None       Diet Order:   Diet Order             Diet NPO time specified  Diet effective now                   EDUCATION NEEDS:   Not appropriate for education at this time  Skin:  Skin Assessment: Reviewed RN Assessment  Last BM:  7/24  Height:   Ht Readings from Last 1 Encounters:  01/12/22 5' (1.524 m)    Weight:   Wt Readings from Last 1 Encounters:  01/13/22 42.8 kg    Ideal Body Weight:     BMI:  Body mass index is 18.43 kg/m.  Estimated Nutritional Needs:   Kcal:  1600-1800 kcals  Protein:  75-90 g  Fluid:  >/= 1.6 L    Cate  Rico Junker MS, RDN, LDN, CNSC Registered Dietitian 3 Clinical Nutrition RD Pager and On-Call Pager Number Located in [place]

## 2022-01-13 NOTE — Progress Notes (Signed)
EEG complete - results pending 

## 2022-01-13 NOTE — Progress Notes (Signed)
Dr. Tamala Julian verbally notified of hemoglobin level of 6.2

## 2022-01-13 NOTE — Progress Notes (Signed)
vLTM started  All impedances below 10kohms.  [facility]rium to monitor    Patient event button tested 

## 2022-01-13 NOTE — Procedures (Addendum)
Patient Name: [name]  MRN: [id number]  Epilepsy Attending: Lora Havens  Referring Physician/Provider: Lestine Mount, PA-C  Date: 01/13/2022 Duration: 22.06 mins  Patient history: 56 year old female with altered mental status.  EEG to evaluate for seizure.  Level of alertness:  comatose  AEDs during EEG study: Propofol  Technical aspects: This EEG study was done with scalp electrodes positioned according to the 10-20 International system of electrode placement. Electrical activity was acquired at a sampling rate of '500Hz'$  and reviewed with a high frequency filter of '70Hz'$  and a low frequency filter of '1Hz'$ . EEG data were recorded continuously and digitally stored.   Description: EEG showed continuous generalized 3 to 6 Hz  theta-delta slowing admixed with 12 to 14 Hz beta activity.  Abundant spikes and polyspikes were noted in left frontal region.  Intermittent spikes were also noted in right frontal region independent from left frontal region.  Hyperventilation and photic stimulation were not performed.     ABNORMALITY - Spike, left and right frontal region - Continuous slow, generalized  IMPRESSION: This study showed evidence of independent epileptogenicity arising from left> right frontal region. Additionally there is moderate to severe diffuse encephalopathy, nonspecific etiology. No seizures were seen throughout the recording.   Floyed Masoud Barbra Sarks

## 2022-01-14 ENCOUNTER — Inpatient Hospital Stay (HOSPITAL_COMMUNITY): Payer: Medicare Other

## 2022-01-14 DIAGNOSIS — R569 Unspecified convulsions: Secondary | ICD-10-CM

## 2022-01-14 DIAGNOSIS — T50902A Poisoning by unspecified drugs, medicaments and biological substances, intentional self-harm, initial encounter: Secondary | ICD-10-CM | POA: Diagnosis not present

## 2022-01-14 DIAGNOSIS — E43 Unspecified severe protein-calorie malnutrition: Secondary | ICD-10-CM | POA: Insufficient documentation

## 2022-01-14 LAB — GLUCOSE, CAPILLARY
Glucose-Capillary: 158 mg/dL — ABNORMAL HIGH (ref 70–99)
Glucose-Capillary: 161 mg/dL — ABNORMAL HIGH (ref 70–99)
Glucose-Capillary: 88 mg/dL (ref 70–99)
Glucose-Capillary: 159 mg/dL — ABNORMAL HIGH (ref 70–99)
Glucose-Capillary: 178 mg/dL — ABNORMAL HIGH (ref 70–99)

## 2022-01-14 LAB — COMPREHENSIVE METABOLIC PANEL
ALT: 15 U/L (ref 0–44)
AST: 21 U/L (ref 15–41)
Albumin: 2.2 g/dL — ABNORMAL LOW (ref 3.5–5.0)
Alkaline Phosphatase: 54 U/L (ref 38–126)
Anion gap: 6 (ref 5–15)
BUN: 25 mg/dL — ABNORMAL HIGH (ref 6–20)
CO2: 28 mmol/L (ref 22–32)
Calcium: 8 mg/dL — ABNORMAL LOW (ref 8.9–10.3)
Chloride: 106 mmol/L (ref 98–111)
Creatinine, Ser: 0.56 mg/dL (ref 0.44–1.00)
GFR, Estimated: >60 mL/min (ref >60)
Glucose, Bld: 169 mg/dL — ABNORMAL HIGH (ref 70–99)
Potassium: 4.2 mmol/L (ref 3.5–5.1)
Sodium: 140 mmol/L (ref 135–145)
Total Bilirubin: 0.8 mg/dL (ref 0.3–1.2)
Total Protein: 6.3 g/dL — ABNORMAL LOW (ref 6.5–8.1)

## 2022-01-14 LAB — URINALYSIS, ROUTINE W REFLEX MICROSCOPIC
Bilirubin Urine: NEGATIVE
Glucose, UA: NEGATIVE mg/dL
Hgb urine dipstick: NEGATIVE
Ketones, ur: NEGATIVE mg/dL
Leukocytes,Ua: NEGATIVE
Nitrite: NEGATIVE
Protein, ur: 30 mg/dL — AB
Specific Gravity, Urine: 1.02 (ref 1.005–1.030)
pH: 5 (ref 5.0–8.0)

## 2022-01-14 LAB — CBC
HCT: 29.6 % — ABNORMAL LOW (ref 36.0–46.0)
Hemoglobin: 8.1 g/dL — ABNORMAL LOW (ref 12.0–15.0)
MCH: 18.8 pg — ABNORMAL LOW (ref 26.0–34.0)
MCHC: 27.4 g/dL — ABNORMAL LOW (ref 30.0–36.0)
MCV: 68.8 fL — ABNORMAL LOW (ref 80.0–100.0)
Platelets: 304 10*3/uL (ref 150–400)
RBC: 4.3 MIL/uL (ref 3.87–5.11)
RDW: 22 % — ABNORMAL HIGH (ref 11.5–15.5)
WBC: 13.3 10*3/uL — ABNORMAL HIGH (ref 4.0–10.5)
nRBC: 0 % (ref 0.0–0.2)

## 2022-01-14 LAB — BASIC METABOLIC PANEL
Anion gap: 5 (ref 5–15)
BUN: 19 mg/dL (ref 6–20)
CO2: 29 mmol/L (ref 22–32)
Calcium: 8 mg/dL — ABNORMAL LOW (ref 8.9–10.3)
Chloride: 103 mmol/L (ref 98–111)
Creatinine, Ser: 0.66 mg/dL (ref 0.44–1.00)
GFR, Estimated: >60 mL/min (ref >60)
Glucose, Bld: 148 mg/dL — ABNORMAL HIGH (ref 70–99)
Potassium: 4.1 mmol/L (ref 3.5–5.1)
Sodium: 137 mmol/L (ref 135–145)

## 2022-01-14 LAB — MAGNESIUM: Magnesium: 2.3 mg/dL (ref 1.7–2.4)

## 2022-01-14 LAB — AMMONIA: Ammonia: 16 umol/L (ref 9–35)

## 2022-01-14 LAB — PHOSPHORUS: Phosphorus: 4.5 mg/dL (ref 2.5–4.6)

## 2022-01-14 MED ORDER — THIAMINE HCL 100 MG/ML IJ SOLN
500.0000 mg | Freq: Two times a day (BID) | INTRAVENOUS | Status: AC
  Administered 2022-01-14 – 2022-01-16 (×6): 500 mg via INTRAVENOUS
  Filled 2022-01-14 (×7): qty 5

## 2022-01-14 MED ORDER — NOREPINEPHRINE 4 MG/250ML-% IV SOLN
2.0000 ug/min | INTRAVENOUS | Status: DC
  Administered 2022-01-14: 2 ug/min via INTRAVENOUS
  Administered 2022-01-14 – 2022-01-15 (×2): 7 ug/min via INTRAVENOUS
  Administered 2022-01-16: 6 ug/min via INTRAVENOUS
  Filled 2022-01-14 (×5): qty 250

## 2022-01-14 MED ORDER — THIAMINE HCL 100 MG/ML IJ SOLN
100.0000 mg | INTRAMUSCULAR | Status: DC
  Administered 2022-01-17 – 2022-01-18 (×2): 100 mg via INTRAVENOUS
  Filled 2022-01-14 (×2): qty 2

## 2022-01-14 MED ORDER — LACTULOSE 10 GM/15ML PO SOLN
20.0000 g | Freq: Three times a day (TID) | ORAL | Status: DC
  Administered 2022-01-14 – 2022-01-22 (×19): 20 g
  Filled 2022-01-14 (×21): qty 30

## 2022-01-14 MED ORDER — LORAZEPAM 1 MG PO TABS: 1.0000 mg | ORAL_TABLET | ORAL | Status: DC | PRN

## 2022-01-14 MED ORDER — LEVETIRACETAM IN NACL 500 MG/100ML IV SOLN
500.0000 mg | Freq: Two times a day (BID) | INTRAVENOUS | Status: DC
  Administered 2022-01-14 – 2022-01-19 (×11): 500 mg via INTRAVENOUS
  Filled 2022-01-14 (×11): qty 100

## 2022-01-14 MED ORDER — SODIUM CHLORIDE 0.9 % IV SOLN
250.0000 mL | INTRAVENOUS | Status: DC
  Administered 2022-01-16 – 2022-01-31 (×5): 250 mL via INTRAVENOUS

## 2022-01-14 MED ORDER — PHENOBARBITAL SODIUM 65 MG/ML IJ SOLN
65.0000 mg | Freq: Three times a day (TID) | INTRAMUSCULAR | Status: AC
  Administered 2022-01-16 – 2022-01-18 (×6): 65 mg via INTRAVENOUS
  Filled 2022-01-14 (×6): qty 1

## 2022-01-14 MED ORDER — OSMOLITE 1.2 CAL PO LIQD
1000.0000 mL | ORAL | Status: DC
  Filled 2022-01-14 (×2): qty 1000

## 2022-01-14 MED ORDER — PHENOBARBITAL SODIUM 65 MG/ML IJ SOLN
65.0000 mg | Freq: Every day | INTRAMUSCULAR | Status: AC
Start: 2022-01-18 — End: 2022-01-19
  Administered 2022-01-18 – 2022-01-19 (×2): 65 mg via INTRAVENOUS
  Filled 2022-01-14 (×2): qty 1

## 2022-01-14 MED ORDER — PHENOBARBITAL SODIUM 130 MG/ML IJ SOLN
130.0000 mg | Freq: Three times a day (TID) | INTRAMUSCULAR | Status: AC
Start: 2022-01-14 — End: 2022-01-16
  Administered 2022-01-14 – 2022-01-16 (×6): 130 mg via INTRAVENOUS
  Filled 2022-01-14 (×6): qty 1

## 2022-01-14 MED ORDER — LORAZEPAM 2 MG/ML IJ SOLN
1.0000 mg | INTRAMUSCULAR | Status: DC | PRN
  Administered 2022-01-14 – 2022-01-15 (×2): 2 mg via INTRAVENOUS
  Filled 2022-01-14 (×2): qty 1

## 2022-01-14 MED ORDER — CYANOCOBALAMIN 1000 MCG/ML IJ SOLN
1000.0000 ug | INTRAMUSCULAR | Status: DC
  Administered 2022-01-14 – 2022-02-11 (×4): 1000 ug via INTRAMUSCULAR
  Filled 2022-01-14 (×5): qty 1

## 2022-01-14 MED ORDER — METHYLNALTREXONE BROMIDE 12 MG/0.6ML ~~LOC~~ SOLN
12.0000 mg | Freq: Once | SUBCUTANEOUS | Status: AC
  Administered 2022-01-14: 12 mg via SUBCUTANEOUS
  Filled 2022-01-14: qty 0.6

## 2022-01-14 MED ORDER — LORAZEPAM 2 MG/ML IJ SOLN
1.0000 mg | INTRAMUSCULAR | Status: DC | PRN
  Filled 2022-01-14: qty 1

## 2022-01-14 MED ORDER — DEXMEDETOMIDINE HCL IN NACL 400 MCG/100ML IV SOLN
0.4000 ug/kg/h | INTRAVENOUS | Status: DC
  Administered 2022-01-14: 0.6 ug/kg/h via INTRAVENOUS
  Administered 2022-01-14: 0.4 ug/kg/h via INTRAVENOUS
  Administered 2022-01-15: 0.8 ug/kg/h via INTRAVENOUS
  Administered 2022-01-16: 0.7 ug/kg/h via INTRAVENOUS
  Administered 2022-01-17 (×2): 1 ug/kg/h via INTRAVENOUS
  Administered 2022-01-18: 1.2 ug/kg/h via INTRAVENOUS
  Administered 2022-01-18: 0.7 ug/kg/h via INTRAVENOUS
  Administered 2022-01-18: 1.2 ug/kg/h via INTRAVENOUS
  Administered 2022-01-19: 1 ug/kg/h via INTRAVENOUS
  Administered 2022-01-19: 0.8 ug/kg/h via INTRAVENOUS
  Administered 2022-01-20: 1 ug/kg/h via INTRAVENOUS
  Administered 2022-01-20 (×2): 1.2 ug/kg/h via INTRAVENOUS
  Administered 2022-01-21: 1 ug/kg/h via INTRAVENOUS
  Administered 2022-01-21: 0.8 ug/kg/h via INTRAVENOUS
  Administered 2022-01-21 – 2022-01-23 (×7): 1.2 ug/kg/h via INTRAVENOUS
  Administered 2022-01-24: 0.4 ug/kg/h via INTRAVENOUS
  Administered 2022-01-24: 1.2 ug/kg/h via INTRAVENOUS
  Administered 2022-01-24: 0.8 ug/kg/h via INTRAVENOUS
  Administered 2022-01-24: 0.4 ug/kg/h via INTRAVENOUS
  Administered 2022-01-25: 1.2 ug/kg/h via INTRAVENOUS
  Filled 2022-01-14 (×29): qty 100

## 2022-01-14 MED ORDER — PANTOPRAZOLE SODIUM 40 MG IV SOLR
40.0000 mg | Freq: Every day | INTRAVENOUS | Status: DC
  Administered 2022-01-15 – 2022-01-25 (×10): 40 mg via INTRAVENOUS
  Filled 2022-01-14 (×10): qty 10

## 2022-01-14 NOTE — Progress Notes (Signed)
Coretrak tube that was placed earlier today became clogged and unable to flush. Multiple attempts made to unclog tube, made MD and PA aware of situation d/t needing to give her lactulose and to decompress stomach. Orders placed for NG tube per PA, to be placed if tube is unable to be unclogged.

## 2022-01-14 NOTE — Procedures (Signed)
Cortrak  Tube Type:  Cortrak - 43 inches Tube Location:  Left nare Initial Placement:  Stomach Secured by: Bridle Technique Used to Measure Tube Placement:  Marking at nare/corner of mouth Cortrak Secured At:  68 cm   Cortrak Tube Team Note:  Consult received to place a Cortrak feeding tube.   X-ray is required, abdominal x-ray has been ordered by the Cortrak team. Please confirm tube placement before using the Cortrak tube.   If the tube becomes dislodged please keep the tube and contact the Cortrak team at [email or web address] (password [email or web address]) for replacement.  If after hours and replacement cannot be delayed, place a NG tube and confirm placement with an abdominal x-ray.    Koleen Distance MS, RD, LDN Please refer to [facility] for RD and/or RD on-call/weekend/after hours pager

## 2022-01-14 NOTE — Progress Notes (Signed)
An USGPIV (ultrasound guided PIV) has been placed for short-term vasopressor infusion. A correctly placed ivWatch must be used when administering Vasopressors. Should this treatment be needed beyond 72 hours, central line access should be obtained.  It will be the responsibility of the bedside nurse to follow best practice to prevent extravasations.   ?

## 2022-01-14 NOTE — Consult Note (Signed)
Neurology Consultation Reason for Consult: Abnormal EEG Referring Physician: Tamala Julian, D  CC: Abnormal EEG  History is obtained from: Chart review  HPI: [name] is a 56 y.o. female with history of multiple psychiatric problems including recent violent behavior he was smoking crack when she decided to try and kill herself.  She did so by taking a large dose of promethazine, unclear if syrup or pills.  Since presentation, she was intubated for airway protection and has been receiving supportive care.  An EEG was obtained today which demonstrates interictal activity.  There was one episode of stiffening seen overnight, though not definite seizure.  Past Medical History:  Diagnosis Date   Abdominal pain    Accidental drug overdose April 2013   Anxiety    Atrial fibrillation ([place]) 09/29/11   converted spontaneously   Chronic back pain    Chronic knee pain    Chronic nausea    Chronic pain    COPD (chronic obstructive pulmonary disease) ([place])    Depression    Diabetes mellitus    states her doctor took her off all DM meds in past month   Diabetic neuropathy ([place])    Dyspnea    with exertion    GERD (gastroesophageal reflux disease)    Headache(784.0)    migraines    HTN (hypertension)    not on meds since in a year    Hyperlipidemia    Hypothyroidism    not on meds in a while    Mental disorder    Bipolar and schizophrenic   Requires supplemental oxygen    as needed per patient    Schizophrenia ([place])    Schizophrenia, acute ([place]) 11/13/2017   Tobacco abuse      Family History  Problem Relation Age of Onset   Heart attack Father        45s   Diabetes Mother    Heart disease Mother    Hypertension Mother    Heart attack Sister        66   COPD Other    Breast cancer Neg Hx      Social History:  reports that she has been smoking cigarettes. She has been smoking an average of .5 packs per day. She has never used smokeless tobacco. She reports current alcohol use.  She reports current drug use. Drugs: Cocaine, Marijuana, and "Crack" cocaine.   Exam: Current vital signs: BP (!) 102/54   Pulse (!) 122   Temp 99.9 F (37.7 C) (Axillary)   Resp 17   Ht 5' (1.524 m)   Wt 43 kg   LMP 01/08/2011   SpO2 98%   BMI 18.51 kg/m  Vital signs in last 24 hours: Temp:  [99.9 F (37.7 C)-101 F (38.3 C)] 99.9 F (37.7 C) (07/25 0400) Pulse Rate:  [91-129] 122 (07/25 0645) Resp:  [15-20] 17 (07/25 0645) BP: (80-156)/(49-72) 102/54 (07/25 0645) SpO2:  [91 %-100 %] 98 % (07/25 0645) FiO2 (%):  [40 %-50 %] 50 % (07/25 0306) Weight:  [43 kg] 43 kg (07/25 0217)   Physical Exam  She is in bed intubated  Neuro: Mental Status: She is comatose, does not open eyes or follow commands Cranial Nerves: II: Does not blink to threat. Pupils are equal, round, and reactive to light.   III,IV, VI: Eyes are disconjugate V: VII: Corneals are intact Motor: She has minimal withdrawal of bilateral lower extremities, flicker of withdrawal in bilateral upper extremities  sensory: As above  I have reviewed labs in epic and the results pertinent to this consultation are: B12 193  I have reviewed the images obtained: MRI brain from May-no definite seizure focus but images are essentially nondiagnostic  Impression: 56 year old female with promethazine overdose with abnormal EEG.  She has not had any clear seizures, but I would favor starting antiepileptic therapy for a few days given the irritability seen.  Recommendations: 1) Keppra 500 mg twice daily 2) LTM EEG 3) neurology will follow   Roland Rack, MD [facility] [phone]  If 7pm- 7am, please page neurology on call as listed in [place].

## 2022-01-14 NOTE — Progress Notes (Signed)
Subjective: Patient had been agitated and therefore was given a bolus of sedation shortly before my evaluation of her.  Per nursing, she was sitting up in bed and moving all four extremities voluntarily.  Exam: Vitals:   01/14/22 1015 01/14/22 1030  BP: (!) 110/59 (!) 101/58  Pulse: (!) 123 (!) 122  Resp: (!) 9 12  Temp:    SpO2: 100% 96%   Gen: In bed, NAD Resp: non-labored breathing, no acute distress Abd: soft, nt  Neuro: MS: Does not open eyes or follow commands CN: Pupils are equal and reactive, gaze is disconjugate Motor: She withdraws all four extremities to noxious stimulation Sensory: As above   Pertinent Labs: Cr 0.66  Impression: 56 year old female with promethazine overdose with abnormal EEG.  She has not had any clear seizures, but I would favor continuing  antiepileptic therapy for a few days given the irritability seen.  Recommendations: 1) Continue EEG for another day 2) keppra '500mg'$  BId 3) Will follow.   Roland Rack, MD [facility] [phone]  If 7pm- 7am, please page neurology on call as listed in [place].

## 2022-01-14 NOTE — Progress Notes (Addendum)
Nutrition Follow-up  DOCUMENTATION CODES:   Underweight, Severe malnutrition in context of chronic illness  INTERVENTION:   Tube Feeding via Cortrak: Start TF at trickle rate only initially   Goal: Osmolite 1.2 at 55 ml/hr Pro-source TF 45 mL daily This provides 84 g of protein, 1624 kcals and 1069 mL of free water  Recommend aggressive bowel regimen given moderate to severe stool burden in colon per abd xray  Recommend continuing to monitor electrolytes closely given refeeding risk.   Add Vitamin B12 1000 mcg IM every 7 days  Add Vitamin D supplementation when able to take meds by mouth  Continue MVI with Minerals BID  Continue Thiamine supplement-MD changed to IV   NUTRITION DIAGNOSIS:   Severe Malnutrition related to chronic illness (hx gastric bypass, COPD, polysubtance abuse) as evidenced by severe fat depletion, severe muscle depletion.  Being addressed via TF   GOAL:   Patient will meet greater than or equal to 90% of their needs  Progressing  MONITOR:   TF tolerance, Vent status, Labs, Weight trends  REASON FOR ASSESSMENT:   Consult, Ventilator Enteral/tube feeding initiation and management  ASSESSMENT:   56 yo female admitted with acute encephalopathy in setting of intentional OD with SI, intubated for airway protection. PMH includes COPD, HTN, DM, schizoaffective disorder, depression. Pt with hx of gastric bypass in 2017, sig weight loss  7/24 TF initiated 7/25 Extubated, Cortrak placed, TF to resume  1:1 sitter, febrile this AM Extubated this AM, pt confused/agitated, NPO. Requiring precedex gtt, ativan  Cortrak placed post extubation, plan to resume TF  RN reports emesis overnight, none today thus far, Abd xray with possible ileus, moderate to severe stool burden in colon.   Micronutrient Panel: CRP: 9.1 (H) Vitamin D: 10.32 (L) Vitamin B12: 193 (L-normal)  Vitamin B12 is barely wdl. CRP is elevated and inflammation has a modest effect  on falsely increasing B12 levels. Given this, her true B12 level likely deficient. Pt at high risk due to malnutrition, poor intake and substance abuse. Pt also hx of gastric bypass and should have been taking B12 as an outpatient but has not. Plan to supplement  MD changed Thiamine supplementation to IV  Labs: phosphorus, magnesium and potassium wdl Meds: colace, precedex, ss novolog, MVI x 2   Diet Order:   Diet Order             Diet NPO time specified  Diet effective now                   EDUCATION NEEDS:   Not appropriate for education at this time  Skin:  Skin Assessment: Reviewed RN Assessment  Last BM:  7/24  Height:   Ht Readings from Last 1 Encounters:  01/12/22 5' (1.524 m)    Weight:   Wt Readings from Last 1 Encounters:  01/14/22 43 kg     BMI:  Body mass index is 18.51 kg/m.  Estimated Nutritional Needs:   Kcal:  1600-1800 kcals  Protein:  75-90 g  Fluid:  >/= 1.6 L   Kerman Passey MS, RDN, LDN, CNSC Registered Dietitian 3 Clinical Nutrition RD Pager and On-Call Pager Number Located in [place]

## 2022-01-14 NOTE — Procedures (Addendum)
Patient Name: MANAIA SAMAD  MRN: [id number]  Epilepsy Attending: Lora Havens  Referring Physician/Provider: Lestine Mount, PA-C  Duration: 01/13/2022 1402 to 01/14/2022 1402   Patient history: 56 year old female with altered mental status.  EEG to evaluate for seizure.   Level of alertness:  comatose   AEDs during EEG study: Propofol, LEV   Technical aspects: This EEG study was done with scalp electrodes positioned according to the 10-20 International system of electrode placement. Electrical activity was acquired at a sampling rate of '500Hz'$  and reviewed with a high frequency filter of '70Hz'$  and a low frequency filter of '1Hz'$ . EEG data were recorded continuously and digitally stored.    Description: EEG showed continuous generalized 3 to 6 Hz  theta-delta slowing admixed with 12 to 14 Hz beta activity. Abundant generalized spikes and polyspikes were noted at times more prominent in the left and right frontal region. Hyperventilation and photic stimulation were not performed.      ABNORMALITY - Spike, generalized - Continuous slow, generalized   IMPRESSION: This study showed evidence of epileptogenicity with generalized onset but at times appeared to have left and right frontal predominance. Additionally there is moderate to severe diffuse encephalopathy, nonspecific etiology. No seizures were seen throughout the recording.         Justine Cossin Barbra Sarks

## 2022-01-14 NOTE — TOC Progression Note (Addendum)
Transition of Care [facility]) - Progression Note    Patient Details  Name: [name] MRN: [id number] Date of Birth: 01/01/1966  Transition of Care [facility]) CM/SW [place], [place] Phone Number: 01/14/2022, 3:40 PM  Clinical Narrative:     Patient is currently IVC'D. CSW will continue to follow and assist with patients dc planning needs.CSW following to complete consult for substance use when appropriate.     Barriers to Discharge: Continued Medical Work up  Expected Discharge Plan and Services     Discharge Planning Services: CM Consult   Living arrangements for the past 2 months: [place] Determinants of Health (SDOH) Interventions    Readmission Risk Interventions    07/27/2020   11:06 AM  Readmission Risk Prevention Plan  Transportation Screening Complete  PCP or Specialist Appt within 5-7 Days Complete  Home Care Screening Complete

## 2022-01-14 NOTE — Progress Notes (Signed)
This RN received IVC paperwork from nurse practitioner, SW notified and pick up paperwork to fax to magistrate office.

## 2022-01-14 NOTE — Progress Notes (Signed)
NAME:  [name], MRN:  [id number], DOB:  1965/11/05, LOS: 2 ADMISSION DATE:  01/12/2022, CONSULTATION DATE:  01/12/21 REFERRING MD:  Sedonia Small - EM, CHIEF COMPLAINT:  Intentional Overdose    History of Present Illness:  56 yo F PMH schizoaffective disorder (bipolar type), Depression, prior SI, polysubstance use disorder, COPD, HTN, DM presented to the ED following suicide attempt. Patient had been drinking, while with friends stated that she was going to kill herself and was witnessed ingesting a bottle of promethazine.   In transport with EMS became more somnolent. Arrived to ED with GCS 3 and was intubated in this setting. EM discussed case with poison control who recommended supportive care  PCCM consulted for admission in this setting.  Pertinent Medical History:  Schizoaffective disorder, bipolar type Depression Polysubstance abuse  COPD HTN DM    Significant Hospital Events: Including procedures, antibiotic start and stop dates in addition to other pertinent events   7/23 ED after etoh consumption and intentional promethazine overdose. Intubated. Admit to ICU  7/24 Tachycardic to 130s overnight, also with episode of whole body stiffening, ?dystonia vs. seizure. EEG with irritability, LTM initiated. Neuro consulted. 7/25 extubated  Interim History / Subjective:  Swung at nurses overnight requiring increased sedation.  Febrile this am.  Objective:  Blood pressure (!) 102/54, pulse (!) 122, temperature (!) 101.7 F (38.7 C), temperature source Axillary, resp. rate 17, height 5' (1.524 m), weight 43 kg, last menstrual period 01/08/2011, SpO2 98 %.    Vent Mode: PRVC FiO2 (%):  [40 %-50 %] 50 % Set Rate:  [18 bmp] 18 bmp Vt Set:  [360 mL] 360 mL PEEP:  [5 cmH20] 5 cmH20 Plateau Pressure:  [15 cmH20-16 cmH20] 15 cmH20   Intake/Output Summary (Last 24 hours) at 01/14/2022 0815 Last data filed at 01/14/2022 1749 Gross per 24 hour  Intake 2100.88 ml  Output 790 ml  Net  1310.88 ml    Filed Weights   01/12/22 0700 01/13/22 0500 01/14/22 0217  Weight: 42.2 kg 42.8 kg 43 kg   Physical Examination: Chronically ill woman on vent +temporal wasting, ett with minimal secretions Lungs clear Heart sounds regular, ext warm Ext warm Flails all 4 ext but not to command   Resolved Hospital Problem List:     Assessment & Plan:   Acute toxic metabolic encephalopathy in setting of intentional overdose Remote seizure history - AEDs, cEEG per neuro - Re eval after stopping sedation and (hopefully) extubating  Suicidal Ideation with intentional overdose of promethazine  Schizoaffective disorder, Bipolar type Depression  Polysubstance abuse (crack cocaine, EtOH) - S/p activated charcoal administered in ED - UDS +cocaine, Ethanol 140 - Monitor QTc - Trend BMP, LFTs - Psychiatric consult when patient able to participate in care  Acute respiratory failure in setting of intentional overdose Hx COPD, without acute exacerbation  Tobacco use disorder P - See if we can wean to extubate today to get better mental status eval - Nebs PRN  Hx HTN On dilt for this at home - Hold hold antihypertensives at present - Resume as clinically appropriate - Cardiac monitoring  Iron def anemia Notable hx of allergic rxn to iron transfusion previously.  Required 2 units this admit.  No s/s of bleeding. - PPI to qday  T2DM with hyperglycemia - SSI - CBGs Q4H - Goal CBG 140-180  New: fever - CXR, UA, tylenol PRN, hold abx for now  Best Practice (right click and "Reselect all SmartList Selections" daily)   Diet/type:  NPO DVT prophylaxis: prophylactic heparin  GI prophylaxis: PPI Lines: N/A Foley:  N/A Code Status:  full code Last date of multidisciplinary goals of care discussion [pending ]  Critical care time: 31 minutes   The patient is critically ill with multiple organ system failure and requires high complexity decision making for assessment and  support, frequent evaluation and titration of therapies, advanced monitoring, review of radiographic studies and interpretation of complex data.   Critical Care Time devoted to patient care services, exclusive of separately billable procedures, described in this note is 31 minutes.  Erskine Emery MD  Pulmonary & Critical Care 01/14/22 8:15 AM  Please see [email or web address] for pager details.  From 7A-7P if no response, please call [phone] After hours, please call ELink [phone]

## 2022-01-14 NOTE — Consult Note (Signed)
  56 yo F PMH schizoaffective disorder (bipolar type), Depression, prior SI, polysubstance use disorder, COPD, HTN, DM presented to the ED following suicide attempt. Patient had been drinking, while with friends stated that she was going to kill herself and was witnessed ingesting a bottle of promethazine.   In transport with EMS became more somnolent. Arrived to ED with GCS 3 and was intubated in this setting. EM discussed case with poison control who recommended supportive care   Psychiatry consulted due to suicide attempt by overdose.  Patient was recently extubated this morning, several hours prior to attempt to assess patient.  Patient remains somnolent, currently receiving Precedex drip, and Levophed.  Due to patient's suicide attempt of high lethality, previous psychiatric history, and additional risk factors that place patient at high risk for suicide completion, will initiate involuntary commitment at this time.  Patient continues to be a danger to self.  Upon extubating as reported by nursing staff she states" I am still going to fucking kill myself."   -Recommend thiamine 500 mg IV twice a day x 3 to 5 days due to alcohol use disorder severe, and likely encephalopathy in the setting of suicide attempt, alcohol withdrawal.  -IVC papers have been initiated and completed by the psychiatric nurse practitioner, forms do require notary and faxing off to magistrate.  This information was passed out to [facility] and staff nurse at bedside.  Did update mother who is also at the bedside of plan to initiate IVC due to suicide attempt of high lethality.  Patient will require inpatient psychiatric admission once medically stable.  Will attempt to reassess tomorrow.

## 2022-01-14 NOTE — Procedures (Signed)
Extubation Procedure Note  Patient Details:   Name: OAKLEIGH HESKETH DOB: 21-Mar-1966 MRN: [id number]   Airway Documentation:    Vent end date: 01/14/22 Vent end time: 0950   Evaluation  O2 sats: stable throughout Complications: No apparent complications Patient did tolerate procedure well. Bilateral Breath Sounds: Clear, Diminished   Yes  Unable to access cuff leak. MD aware and at bedside. Patient placed on 6L [place]. No stridor noted.    Levin Bacon 01/14/2022, 10:18 AM

## 2022-01-14 NOTE — Progress Notes (Signed)
vLTM maintenance  All impedances below 10kohms  No skin breakdown at all skin sites

## 2022-01-14 NOTE — Progress Notes (Signed)
Delirious, not redirectable.  Precedex not working. Heavy drinker. Will start CIWA ativan and phenobarb taper.

## 2022-01-14 NOTE — Progress Notes (Signed)
PT Cancellation Note  Patient Details Name: [name] MRN: [id number] DOB: 04-19-1966   Cancelled Treatment:    Reason Eval/Treat Not Completed: Patient not medically ready with continued delirium; RN suggests hold until tomorrow. Will follow-up for PT Evaluation.  Mabeline Caras, PT, DPT Acute Rehabilitation Services  Personal: [facility] Office: [place] 01/14/2022, 2:28 PM

## 2022-01-15 ENCOUNTER — Inpatient Hospital Stay (HOSPITAL_COMMUNITY): Payer: Medicare Other

## 2022-01-15 ENCOUNTER — Encounter: Payer: Self-pay | Admitting: Pulmonary Disease

## 2022-01-15 DIAGNOSIS — G9341 Metabolic encephalopathy: Secondary | ICD-10-CM | POA: Diagnosis not present

## 2022-01-15 DIAGNOSIS — T50902A Poisoning by unspecified drugs, medicaments and biological substances, intentional self-harm, initial encounter: Secondary | ICD-10-CM | POA: Diagnosis not present

## 2022-01-15 DIAGNOSIS — R401 Stupor: Secondary | ICD-10-CM | POA: Diagnosis not present

## 2022-01-15 DIAGNOSIS — R569 Unspecified convulsions: Secondary | ICD-10-CM | POA: Diagnosis not present

## 2022-01-15 LAB — CBC WITH DIFFERENTIAL/PLATELET
Abs Immature Granulocytes: 0 10*3/uL (ref 0.00–0.07)
Abs Immature Granulocytes: 0.16 10*3/uL — ABNORMAL HIGH (ref 0.00–0.07)
Basophils Absolute: 0 10*3/uL (ref 0.0–0.1)
Basophils Absolute: 0.1 10*3/uL (ref 0.0–0.1)
Basophils Relative: 0 %
Basophils Relative: 0 %
Eosinophils Absolute: 0 10*3/uL (ref 0.0–0.5)
Eosinophils Absolute: 0 10*3/uL (ref 0.0–0.5)
Eosinophils Relative: 0 %
Eosinophils Relative: 0 %
HCT: 28.1 % — ABNORMAL LOW (ref 36.0–46.0)
HCT: 30.1 % — ABNORMAL LOW (ref 36.0–46.0)
Hemoglobin: 7.7 g/dL — ABNORMAL LOW (ref 12.0–15.0)
Hemoglobin: 8.3 g/dL — ABNORMAL LOW (ref 12.0–15.0)
Immature Granulocytes: 1 %
Lymphocytes Relative: 5 %
Lymphocytes Relative: 5 %
Lymphs Abs: 1 10*3/uL (ref 0.7–4.0)
Lymphs Abs: 1.1 10*3/uL (ref 0.7–4.0)
MCH: 18.8 pg — ABNORMAL LOW (ref 26.0–34.0)
MCH: 18.9 pg — ABNORMAL LOW (ref 26.0–34.0)
MCHC: 27.4 g/dL — ABNORMAL LOW (ref 30.0–36.0)
MCHC: 27.6 g/dL — ABNORMAL LOW (ref 30.0–36.0)
MCV: 68.7 fL — ABNORMAL LOW (ref 80.0–100.0)
MCV: 68.6 fL — ABNORMAL LOW (ref 80.0–100.0)
Monocytes Absolute: 0.4 10*3/uL (ref 0.1–1.0)
Monocytes Absolute: 0.7 10*3/uL (ref 0.1–1.0)
Monocytes Relative: 2 %
Monocytes Relative: 3 %
Neutro Abs: 17.8 10*3/uL — ABNORMAL HIGH (ref 1.7–7.7)
Neutro Abs: 18.5 10*3/uL — ABNORMAL HIGH (ref 1.7–7.7)
Neutrophils Relative %: 93 %
Neutrophils Relative %: 91 %
Platelets: 306 10*3/uL (ref 150–400)
Platelets: 309 10*3/uL (ref 150–400)
RBC: 4.09 MIL/uL (ref 3.87–5.11)
RBC: 4.39 MIL/uL (ref 3.87–5.11)
RDW: 22.4 % — ABNORMAL HIGH (ref 11.5–15.5)
RDW: 22.5 % — ABNORMAL HIGH (ref 11.5–15.5)
WBC: 19.1 10*3/uL — ABNORMAL HIGH (ref 4.0–10.5)
WBC: 20.5 10*3/uL — ABNORMAL HIGH (ref 4.0–10.5)
nRBC: 0 /100 WBC
nRBC: 0.1 % (ref 0.0–0.2)
nRBC: 0 % (ref 0.0–0.2)

## 2022-01-15 LAB — BASIC METABOLIC PANEL
Anion gap: 7 (ref 5–15)
BUN: 25 mg/dL — ABNORMAL HIGH (ref 6–20)
CO2: 27 mmol/L (ref 22–32)
Calcium: 8.2 mg/dL — ABNORMAL LOW (ref 8.9–10.3)
Chloride: 109 mmol/L (ref 98–111)
Creatinine, Ser: 0.61 mg/dL (ref 0.44–1.00)
GFR, Estimated: >60 mL/min (ref >60)
Glucose, Bld: 99 mg/dL (ref 70–99)
Potassium: 4.3 mmol/L (ref 3.5–5.1)
Sodium: 143 mmol/L (ref 135–145)

## 2022-01-15 LAB — POCT I-STAT 7, (LYTES, BLD GAS, ICA,H+H)
Acid-Base Excess: 5 mmol/L — ABNORMAL HIGH (ref 0.0–2.0)
Bicarbonate: 29.8 mmol/L — ABNORMAL HIGH (ref 20.0–28.0)
Calcium, Ion: 1.17 mmol/L (ref 1.15–1.40)
HCT: 26 % — ABNORMAL LOW (ref 36.0–46.0)
Hemoglobin: 8.8 g/dL — ABNORMAL LOW (ref 12.0–15.0)
O2 Saturation: 93 %
Patient temperature: 100.7
Potassium: 4.1 mmol/L (ref 3.5–5.1)
Sodium: 142 mmol/L (ref 135–145)
TCO2: 31 mmol/L (ref 22–32)
pCO2 arterial: 47 mmHg (ref 32–48)
pH, Arterial: 7.415 (ref 7.35–7.45)
pO2, Arterial: 72 mmHg — ABNORMAL LOW (ref 83–108)

## 2022-01-15 LAB — GLUCOSE, CAPILLARY
Glucose-Capillary: 123 mg/dL — ABNORMAL HIGH (ref 70–99)
Glucose-Capillary: 87 mg/dL (ref 70–99)
Glucose-Capillary: 120 mg/dL — ABNORMAL HIGH (ref 70–99)
Glucose-Capillary: 143 mg/dL — ABNORMAL HIGH (ref 70–99)
Glucose-Capillary: 97 mg/dL (ref 70–99)
Glucose-Capillary: 121 mg/dL — ABNORMAL HIGH (ref 70–99)

## 2022-01-15 LAB — VITAMIN A: Vitamin A (Retinoic Acid): 14.7 ug/dL — ABNORMAL LOW (ref 20.1–62.0)

## 2022-01-15 MED ORDER — SODIUM CHLORIDE 0.9 % IV SOLN
3.0000 g | Freq: Four times a day (QID) | INTRAVENOUS | Status: AC
  Administered 2022-01-15 – 2022-01-19 (×19): 3 g via INTRAVENOUS
  Filled 2022-01-15 (×19): qty 8

## 2022-01-15 MED ORDER — SODIUM BICARBONATE 650 MG PO TABS
650.0000 mg | ORAL_TABLET | Freq: Once | ORAL | Status: AC
  Administered 2022-01-15: 650 mg
  Filled 2022-01-15: qty 1

## 2022-01-15 MED ORDER — BISACODYL 10 MG RE SUPP
10.0000 mg | Freq: Once | RECTAL | Status: AC
Start: 2022-01-15 — End: 2022-01-15
  Administered 2022-01-15: 10 mg via RECTAL
  Filled 2022-01-15: qty 1

## 2022-01-15 MED ORDER — ROCURONIUM BROMIDE 10 MG/ML (PF) SYRINGE
PREFILLED_SYRINGE | INTRAVENOUS | Status: AC
  Filled 2022-01-15: qty 10

## 2022-01-15 MED ORDER — CHLORHEXIDINE GLUCONATE CLOTH 2 % EX PADS
6.0000 | MEDICATED_PAD | Freq: Every day | CUTANEOUS | Status: DC
  Administered 2022-01-16 – 2022-02-10 (×28): 6 via TOPICAL

## 2022-01-15 MED ORDER — NEOSTIGMINE METHYLSULFATE 10 MG/10ML IV SOLN
0.2500 mg | Freq: Four times a day (QID) | INTRAVENOUS | Status: DC
  Administered 2022-01-15 – 2022-01-16 (×5): 0.25 mg via SUBCUTANEOUS
  Filled 2022-01-15 (×8): qty 0.25

## 2022-01-15 MED ORDER — ETOMIDATE 2 MG/ML IV SOLN
INTRAVENOUS | Status: AC
  Administered 2022-01-15: 20 mg
  Filled 2022-01-15: qty 10

## 2022-01-15 MED ORDER — IPRATROPIUM-ALBUTEROL 0.5-2.5 (3) MG/3ML IN SOLN
3.0000 mL | Freq: Three times a day (TID) | RESPIRATORY_TRACT | Status: DC
  Administered 2022-01-15 – 2022-01-24 (×28): 3 mL via RESPIRATORY_TRACT
  Filled 2022-01-15 (×29): qty 3

## 2022-01-15 MED ORDER — FENTANYL CITRATE PF 50 MCG/ML IJ SOSY
PREFILLED_SYRINGE | INTRAMUSCULAR | Status: AC
  Administered 2022-01-15: 50 ug
  Filled 2022-01-15: qty 2

## 2022-01-15 MED ORDER — ORAL CARE MOUTH RINSE
15.0000 mL | OROMUCOSAL | Status: DC
  Administered 2022-01-15 – 2022-01-27 (×137): 15 mL via OROMUCOSAL

## 2022-01-15 MED ORDER — PANCRELIPASE (LIP-PROT-AMYL) 10440-39150 UNITS PO TABS
20880.0000 [IU] | ORAL_TABLET | Freq: Once | ORAL | Status: AC
  Administered 2022-01-15: 20880 [IU]
  Filled 2022-01-15: qty 2

## 2022-01-15 NOTE — Procedures (Signed)
Intubation Procedure Note  DONALD JACQUE  [id number]  [birth year]  Date:01/15/22  Time:6:35 AM   Provider Performing:Olegario Emberson Rodman Pickle, MD   Procedure: Intubation (69629)  Indication(s) Respiratory Failure  Consent Unable to obtain consent due to emergent nature of procedure.   Anesthesia Etomidate and Fentanyl   Time Out Verified patient identification, verified procedure, site/side was marked, verified correct patient position, special equipment/implants available, medications/allergies/relevant history reviewed, required imaging and test results available.   Sterile Technique Usual hand hygeine, masks, and gloves were used   Procedure Description Patient positioned in bed supine.  Sedation given as noted above.  Patient was intubated with endotracheal tube using Glidescope.  View was Grade 1 full glottis .  Number of attempts was 1.  Colorimetric CO2 detector was consistent with tracheal placement.   Complications/Tolerance None; patient tolerated the procedure well. Chest X-ray is ordered to verify placement.   EBL Minimal   Specimen(s) None

## 2022-01-15 NOTE — Progress Notes (Signed)
PCCM Progress Overnight Night  Called to bedside for hypoxemia. Currently on 10L. Patient somnolent on Precedex 0.8 mcg/kg/h for agitation. She intermittently has secretions that cause desaturations to low 90s and recover after suctioning. Frequently requiring suctioning in the last hour. Will plan to intubate.

## 2022-01-15 NOTE — Consult Note (Signed)
  Patient re intubated at this time, and remains critically ill. Please re consult psychiatry once patient is able to participate in psychiatric evaluation. She will need to remain under IVC that was initiated yesterday due to suicide attempt by overdose.   Psychiatry to sign off at this time.

## 2022-01-15 NOTE — Progress Notes (Signed)
Pharmacy Antibiotic Note  [name] is a 56 y.o. female admitted on 01/12/2022 with aspiration PNA.  Pharmacy has been consulted for Unasyn dosing.  Plan: Unasyn 3g IV q 6 hrs. F/u cultures, renal function and clinical course.  Height: 5' (152.4 cm) Weight: 40.3 kg (88 lb 13.5 oz) IBW/kg (Calculated) : 45.5  Temp (24hrs), Avg:99.8 F (37.7 C), Min:99.4 F (37.4 C), Max:100.7 F (38.2 C)  Recent Labs  Lab 01/12/22 0622 01/12/22 0925 01/13/22 0538 01/14/22 0540 01/14/22 2107 01/15/22 0037 01/15/22 0606  WBC 6.7 6.2 16.2* 13.3*  --  19.1* 20.5*  CREATININE 0.43*  --  0.49 0.66 0.56  --  0.61    Estimated Creatinine Clearance: 49.4 mL/min (by C-G formula based on SCr of 0.61 mg/dL).    Allergies  Allergen Reactions   Iron Dextran Shortness Of Breath and Anxiety   Aspirin Nausea And Vomiting and Other (See Comments)    Ok to take tylenol or ibuprofen     Antimicrobials this admission:  Unasyn 7/26 >   Dose adjustments this admission:   Microbiology results:  7/23 MRSA PCR: neg  Thank you for allowing pharmacy to be a part of this patient's care.  [place] Crane, Roylene Reason, BCCP Clinical Pharmacist  01/15/2022 8:27 AM   [facility] pharmacy phone numbers are listed on [email or web address]

## 2022-01-15 NOTE — Progress Notes (Signed)
[place] Progress Note Patient Name: SNOW PEOPLES DOB: September 30, 1965 MRN: [id number]   Date of Service  01/15/2022  HPI/Events of Note  Nursing reports episode of hypoxia to sat = 85% on $ L/min [place]. Patient also making gurgling sounds and RR has increased to 30. Nursing feels she may be aspirating. Sat now = 95% on 10 L/min [place] O2. Portable CXR just taken.   eICU Interventions  Plan: Await CXR results. Will request that the PCCM ground team evaluate the patient at bedside.      Intervention Category Major Interventions: Hypoxemia - evaluation and management  Chardai Gangemi Cornelia Copa 01/15/2022, 5:48 AM

## 2022-01-15 NOTE — Progress Notes (Signed)
Subjective: Extubated yesterday, but then re-intubated.   Exam: Vitals:   01/15/22 1115 01/15/22 1130  BP: 113/63 114/62  Pulse: 83 83  Resp: (!) 22 (!) 21  Temp:    SpO2: 100% 100%   Gen: In bed, NAD Resp: non-labored breathing, no acute distress Abd: soft, nt  Neuro: MS: Does not open eyes or follow commands CN: Pupils are equal and reactive, gaze is disconjugate Motor: She withdraws all four extremities to noxious stimulation Sensory: As above   Pertinent Labs: Cr 0.66  Impression: 56 year old female with promethazine overdose with abnormal EEG.  She has not had any clear seizures, but I would favor continuing  antiepileptic therapy for now given her EEG. Can stop LTM EEG as no sz x 2 days. With still having mental status issues, will get MRI to rule out watershed strokes.   Recommendations: 1) discontinue EEG 2) keppra '500mg'$  BId 3) MRI brain   Roland Rack, MD [facility] [phone]  If 7pm- 7am, please page neurology on call as listed in [place].

## 2022-01-15 NOTE — Procedures (Addendum)
Patient Name: [name]  MRN: [id number]  Epilepsy Attending: Lora Havens  Referring Physician/Provider: Lestine Mount, PA-C  Duration: 01/14/2022 1402 to 01/15/2022 6415   Patient history: 56 year old female with altered mental status.  EEG to evaluate for seizure.   Level of alertness:  lethargic   AEDs during EEG study: LEV   Technical aspects: This EEG study was done with scalp electrodes positioned according to the 10-20 International system of electrode placement. Electrical activity was acquired at a sampling rate of '500Hz'$  and reviewed with a high frequency filter of '70Hz'$  and a low frequency filter of '1Hz'$ . EEG data were recorded continuously and digitally stored.    Description: EEG showed continuous generalized 3 to 6 Hz  theta-delta slowing admixed with 12 to 14 Hz beta activity. Abundant generalized spikes and polyspikes were noted at times more prominent in the left and right frontal region. Hyperventilation and photic stimulation were not performed.      ABNORMALITY - Spike, generalized - Continuous slow, generalized   IMPRESSION: This study showed evidence of epileptogenicity with generalized onset. Additionally there is moderate to severe diffuse encephalopathy, nonspecific etiology. No seizures were seen throughout the recording.  Samuele Storey Barbra Sarks

## 2022-01-15 NOTE — Progress Notes (Signed)
ABG obtained for post intubation gas.

## 2022-01-15 NOTE — Progress Notes (Signed)
Brief Nutrition Follow-up:  Pt re-intubated yesterday due to wazing and waning mental status with inability to manage secretions.  Noted MRI brain pending.   RD received notification that Cortrak kinked or clogged and unable to be flushed. No Cortrak service available today. TF on hold.   This RD (also on Cortrak team) assessed tube and reviewed abdominal xray. Noted kink close to the tip of tube. RD unclipped bridle and able to pull back on tube slightly and kink resolved. Cortrak now flushes well. Abd xray repeated to confirm Cortrak still in ok position.   Pt may require NG/OG insertion into gastric pouch for decompression with advancement of Cortrak tube beyond gastric into SB.   Pt with abdominal distention. Possible colonic ileus per abd xray, moderate to severe stool burden. PCCM reports pt grimacing when abdomen palpated.  Noted suppository today as well as dose of neostigmine.   Several vitamin labs still pending.   Plan to hold on TF re-initiation at this time, await bowel function.    Kerman Passey MS, RDN, LDN, CNSC Registered Dietitian 3 Clinical Nutrition RD Pager and On-Call Pager Number [place] in [place] ,

## 2022-01-15 NOTE — Progress Notes (Signed)
[facility] Progress Note Patient Name: ALANNAH AVERHART DOB: [birth year] MRN: [id number]   Date of Service  01/15/2022  HPI/Events of Note  Agitation - Nursing request to renew restraint orders.   eICU Interventions  Will renew bilateral soft wrist restraints X 11 hours.      Intervention Category Major Interventions: Delirium, psychosis, severe agitation - evaluation and management  [place] Hawbaker [place] 01/15/2022, 10:36 PM

## 2022-01-15 NOTE — Progress Notes (Signed)
OT Cancellation Note  Patient Details Name: [name] MRN: [id number] DOB: 03/27/1966   Cancelled Treatment:    Reason Eval/Treat Not Completed: Patient not medically ready;Medical issues which prohibited therapy. Pt reintubated this AM. OT will hold for medical appropriateness.   Merri Ray [name] 01/15/2022, 8:09 AM  Jesse Sans OTR/L Acute Rehabilitation Services Office: [phone]

## 2022-01-15 NOTE — Progress Notes (Signed)
PT Cancellation Note  Patient Details Name: NYSIA DELL MRN: [id number] DOB: 22-Mar-1966   Cancelled Treatment:    Reason Eval/Treat Not Completed: Medical issues which prohibited therapy; pt with worsening somnolence and hypoxemia requiring reintubation this AM. Will follow-up for PT Evaluation when appropriate.  Mabeline Caras, PT, DPT Acute Rehabilitation Services  Personal: [facility] Office: [place] 01/15/2022, 8:09 AM

## 2022-01-15 NOTE — Progress Notes (Signed)
LTM EEG discontinued - no skin breakdown at unhook.   

## 2022-01-15 NOTE — Progress Notes (Addendum)
NAME:  [name], MRN:  [id number], DOB:  Oct 30, 1965, LOS: 3 ADMISSION DATE:  01/12/2022, CONSULTATION DATE:  01/12/21 REFERRING MD:  Sedonia Small - EM, CHIEF COMPLAINT:  Intentional Overdose    History of Present Illness:  56 yo F PMH schizoaffective disorder (bipolar type), Depression, prior SI, polysubstance use disorder, COPD, HTN, DM presented to the ED following suicide attempt. Patient had been drinking, while with friends stated that she was going to kill herself and was witnessed ingesting a bottle of promethazine.   In transport with EMS became more somnolent. Arrived to ED with GCS 3 and was intubated in this setting. EM discussed case with poison control who recommended supportive care  PCCM consulted for admission in this setting.  Pertinent Medical History:  Schizoaffective disorder, bipolar type Depression Polysubstance abuse  COPD HTN DM    Significant Hospital Events: Including procedures, antibiotic start and stop dates in addition to other pertinent events   7/23 ED after etoh consumption and intentional promethazine overdose. Intubated. Admit to ICU  7/24 Tachycardic to 130s overnight, also with episode of whole body stiffening, ?dystonia vs. seizure. EEG with irritability, LTM initiated. Neuro consulted. 7/25 Extubated, intermittently somnolent/agitated, not managing secretions well. O2 sats stable on [place]. Cortrak difficult to flush. 7/26 Reintubated early AM for hypoxia, airway protection. Unasyn initiated for ?aspiration PNA. LTM EEG discontinued. MRI pending.  Interim History / Subjective:  Overnight, poor secretion management Intermittently agitated/somnolent, not reliably following commands Attempted to avoid intubation, but patient became hypoxic/tachypneic C/f aspiration, reintubated early AM for airway protection Unasyn started for ?aspiration PNA LTM EEG discontinued per Neuro Obtain MRI Brain while stable airway in place/sedated Mother updated at bedside,  daughter updated via phone  Objective:  Blood pressure 95/61, pulse 81, temperature (!) 100.7 F (38.2 C), temperature source Axillary, resp. rate (!) 21, height 5' (1.524 m), weight 40.3 kg, last menstrual period 01/08/2011, SpO2 99 %.    Vent Mode: PRVC FiO2 (%):  [40 %-60 %] 40 % Set Rate:  [18 bmp] 18 bmp Vt Set:  [360 mL] 360 mL PEEP:  [5 cmH20] 5 cmH20 Plateau Pressure:  [17 cmH20-20 cmH20] 20 cmH20   Intake/Output Summary (Last 24 hours) at 01/15/2022 2706 Last data filed at 01/15/2022 0700 Gross per 24 hour  Intake 666.32 ml  Output 1300 ml  Net -633.68 ml    Filed Weights   01/13/22 0500 01/14/22 0217 01/15/22 0600  Weight: 42.8 kg 43 kg 40.3 kg   Physical Examination: General: Acute-on-chronically ill-appearing middle-aged woman in NAD. HEENT: [place]/AT, anicteric sclera, PERRL 69m, moist mucous membranes. ETT/Cortrak in place. Neuro: Sedated. Responds to noxious stimuli. Withdraws to pain in all 4 extremities. Not following commands. Moves all 4 extremities spontaneously. +Corneal, +Cough, and +Gag  CV: Tachycardic to 100s, regular rhythm, no m/g/r. PULM: Breathing even and unlabored on vent (PEEP 5, FiO2 40%). Lung fields diminished at R base, occasional wheeze, somewhat clearing with suction. GI: Soft, mildly distended. Winces/writhes with palpation. Hypoactive bowel sounds. Extremities: No LE edema noted. Skin: Warm/dry, no rashes.  Resolved Hospital Problem List:     Assessment & Plan:   Acute toxic metabolic encephalopathy in setting of intentional overdose Remote seizure history - LTM EEG discontinued 7/26AM - AEDs per Neuro - Reintubated 7/26AM in the setting of intermittent periods of agitation/somnolence - MRI Brain to be obtained while airway is secure and sedation is in place  Suicidal Ideation with intentional overdose of promethazine  Schizoaffective disorder, Bipolar type Depression  Polysubstance  abuse (crack cocaine, EtOH) S/p activated charcoal  administered in ED. UDS +cocaine, Ethanol 140. - Monitor QTc - Trend BMP, LFTs - Psychiatric consult with IVC orders in place, signed off while patient is reintubated; will re-engage when patient is extubated and can participate in care  Acute respiratory failure in setting of intentional overdose Hx COPD, without acute exacerbation  Tobacco use disorder - Extubated 7/25AM, unable to manage secretions throughout the day due to varying agitation/somnolence and inability to follow commands/redirect - Reintubated 7/26AM - Continue full vent support (4-8cc/kg IBW) - Wean FiO2 for O2 sat > 90% - Daily WUA/SBT as mental status allows - VAP bundle - Bronchodilators PRN - Pulmonary hygiene - PAD protocol for sedation: Precedex, Ativan/ Versed PRN for goal RASS 0 to -1 - Daily CXR  FUO Concern for aspiration PNA Hypotension Likely sedation-related, r/o infectious causes. - Goal MAP > 65 - Levophed titrated to goal MAP - Trend WBC, fever curve - Unasyn for ?aspiration PNA coverage  Ileus Abdominal pain Increased TTP on abdominal exam 7/25-7/26, not having regular BM. - F/u KUB - Continue bowel regimen, +suppository 7/26AM - Neostigmine x 1  Hx HTN On dilt for this at home - Hold antihypertensives at present in the setting of hypotension - Cardiac monitoring  Iron def anemia Notable hx of allergic rxn to iron transfusion previously.  Required 2 units this admit.  No s/s of bleeding. - Trend H&H - Monitor for signs of active bleeding - Transfuse for Hgb < 7.0 or hemodynamically significant bleeding - Resume oral Fe when taking PO - Daily PPI  T2DM with hyperglycemia - SSI - CBGs Q4H - Goal CBG 140-180  Severe protein-calorie malnutrition History of gastric bypass ~2017, at that time weight 240lb. Weight down to 88lb, most significant weight loss over the last 3 years. - Appreciate Nutrition/RD assistance - Cortrak for TF/nutritional support - F/u malnutrition labs -  Significant concern that patient's level of malnutrition may not be amendable, will continue to support her with current interventions and reasess daily  Best Practice (right click and "Reselect all SmartList Selections" daily)   Diet/type: NPO DVT prophylaxis: prophylactic heparin  GI prophylaxis: PPI Lines: N/A Foley:  N/A Code Status:  full code Last date of multidisciplinary goals of care discussion [Mother briefly updated at bedside 7/26AM, daughter [name]) updated via phone 7/26AM; [place] discussion pending]  Critical care time: 39 minutes   The patient is critically ill with multiple organ system failure and requires high complexity decision making for assessment and support, frequent evaluation and titration of therapies, advanced monitoring, review of radiographic studies and interpretation of complex data.   Critical Care Time devoted to patient care services, exclusive of separately billable procedures, described in this note is 39 minutes.  Lestine Mount, PA-C [facility] 01/15/22 10:23 AM  Please see [email or web address] for pager details.  From 7A-7P if no response, please call [phone] After hours, please call ELink [phone]

## 2022-01-16 ENCOUNTER — Inpatient Hospital Stay (HOSPITAL_COMMUNITY): Payer: Medicare Other

## 2022-01-16 ENCOUNTER — Inpatient Hospital Stay: Payer: Self-pay

## 2022-01-16 DIAGNOSIS — T50902A Poisoning by unspecified drugs, medicaments and biological substances, intentional self-harm, initial encounter: Secondary | ICD-10-CM | POA: Diagnosis not present

## 2022-01-16 LAB — TYPE AND SCREEN
ABO/RH(D): A POS
Antibody Screen: POSITIVE
Donor AG Type: NEGATIVE
Donor AG Type: NEGATIVE
PT AG Type: NEGATIVE
Unit division: 0
Unit division: 0

## 2022-01-16 LAB — GLUCOSE, CAPILLARY
Glucose-Capillary: 114 mg/dL — ABNORMAL HIGH (ref 70–99)
Glucose-Capillary: 129 mg/dL — ABNORMAL HIGH (ref 70–99)
Glucose-Capillary: 142 mg/dL — ABNORMAL HIGH (ref 70–99)
Glucose-Capillary: 158 mg/dL — ABNORMAL HIGH (ref 70–99)
Glucose-Capillary: 96 mg/dL (ref 70–99)
Glucose-Capillary: 97 mg/dL (ref 70–99)

## 2022-01-16 LAB — BASIC METABOLIC PANEL
Anion gap: 7 (ref 5–15)
BUN: 19 mg/dL (ref 6–20)
CO2: 25 mmol/L (ref 22–32)
Calcium: 7.7 mg/dL — ABNORMAL LOW (ref 8.9–10.3)
Chloride: 109 mmol/L (ref 98–111)
Creatinine, Ser: 0.51 mg/dL (ref 0.44–1.00)
GFR, Estimated: >60 mL/min (ref >60)
Glucose, Bld: 92 mg/dL (ref 70–99)
Potassium: 4.2 mmol/L (ref 3.5–5.1)
Sodium: 141 mmol/L (ref 135–145)

## 2022-01-16 LAB — BPAM RBC
Blood Product Expiration Date: 202308112359
Blood Product Expiration Date: 202308152359
ISSUE DATE / TIME: 202307241038
Unit Type and Rh: 6200
Unit Type and Rh: 6200

## 2022-01-16 LAB — CBC
HCT: 27.4 % — ABNORMAL LOW (ref 36.0–46.0)
Hemoglobin: 8 g/dL — ABNORMAL LOW (ref 12.0–15.0)
MCH: 19.4 pg — ABNORMAL LOW (ref 26.0–34.0)
MCHC: 29.2 g/dL — ABNORMAL LOW (ref 30.0–36.0)
MCV: 66.3 fL — ABNORMAL LOW (ref 80.0–100.0)
Platelets: 323 10*3/uL (ref 150–400)
RBC: 4.13 MIL/uL (ref 3.87–5.11)
RDW: 22.8 % — ABNORMAL HIGH (ref 11.5–15.5)
WBC: 16.4 10*3/uL — ABNORMAL HIGH (ref 4.0–10.5)
nRBC: 0 % (ref 0.0–0.2)

## 2022-01-16 LAB — ZINC: Zinc: 48 ug/dL (ref 44–115)

## 2022-01-16 LAB — VITAMIN B1: Vitamin B1 (Thiamine): 107.8 nmol/L (ref 66.5–200.0)

## 2022-01-16 LAB — PHOSPHORUS: Phosphorus: 2.5 mg/dL (ref 2.5–4.6)

## 2022-01-16 LAB — MAGNESIUM: Magnesium: 2.4 mg/dL (ref 1.7–2.4)

## 2022-01-16 LAB — COPPER, SERUM: Copper: 129 ug/dL (ref 80–158)

## 2022-01-16 MED ORDER — NOREPINEPHRINE 4 MG/250ML-% IV SOLN
2.0000 ug/min | INTRAVENOUS | Status: DC
  Administered 2022-01-16: 4 ug/min via INTRAVENOUS
  Filled 2022-01-16: qty 250

## 2022-01-16 MED ORDER — SODIUM CHLORIDE 0.9 % IV SOLN
250.0000 mL | INTRAVENOUS | Status: DC
  Administered 2022-01-17: 250 mL via INTRAVENOUS

## 2022-01-16 MED ORDER — IOHEXOL 9 MG/ML PO SOLN
500.0000 mL | ORAL | Status: AC
  Administered 2022-01-16 (×2): 500 mL via ORAL

## 2022-01-16 MED ORDER — FUROSEMIDE 10 MG/ML IJ SOLN
40.0000 mg | Freq: Four times a day (QID) | INTRAMUSCULAR | Status: AC
  Administered 2022-01-16 (×2): 40 mg via INTRAVENOUS
  Filled 2022-01-16 (×2): qty 4

## 2022-01-16 MED ORDER — LORAZEPAM 1 MG PO TABS: 1.0000 mg | ORAL_TABLET | ORAL | Status: DC | PRN

## 2022-01-16 MED ORDER — IOHEXOL 9 MG/ML PO SOLN: 500.0000 mL | ORAL | Status: AC

## 2022-01-16 MED ORDER — BISACODYL 10 MG RE SUPP
10.0000 mg | Freq: Two times a day (BID) | RECTAL | Status: DC
  Administered 2022-01-16 – 2022-02-12 (×20): 10 mg via RECTAL
  Filled 2022-01-16 (×25): qty 1

## 2022-01-16 MED ORDER — LORAZEPAM 2 MG/ML IJ SOLN
1.0000 mg | INTRAMUSCULAR | Status: DC | PRN
  Administered 2022-01-16: 2 mg via INTRAVENOUS
  Filled 2022-01-16: qty 1

## 2022-01-16 NOTE — Progress Notes (Signed)
OT Cancellation Note  Patient Details Name: [name] MRN: [id number] DOB: 1965/07/26   Cancelled Treatment:    Reason Eval/Treat Not Completed: Patient not medically ready (Pt intubated and sedated.)  [name] 01/16/2022, 9:11 AM Cleta Alberts, OTR/L Acute Rehabilitation Services Office: [phone]

## 2022-01-16 NOTE — Progress Notes (Signed)
NAME:  [name], MRN:  [id number], DOB:  [birth year], LOS: 4 ADMISSION DATE:  01/12/2022, CONSULTATION DATE:  01/12/21 REFERRING MD:  [name] - EM, CHIEF COMPLAINT:  Intentional Overdose    History of Present Illness:  56 yo F PMH schizoaffective disorder (bipolar type), Depression, prior SI, polysubstance use disorder, COPD, HTN, DM presented to the ED following suicide attempt. Patient had been drinking, while with friends stated that she was going to kill herself and was witnessed ingesting a bottle of promethazine.   In transport with EMS became more somnolent. Arrived to ED with GCS 3 and was intubated in this setting. EM discussed case with poison control who recommended supportive care  PCCM consulted for admission in this setting.  Pertinent Medical History:  Schizoaffective disorder, bipolar type Depression Polysubstance abuse  COPD HTN DM    Significant Hospital Events: Including procedures, antibiotic start and stop dates in addition to other pertinent events   7/23 ED after etoh consumption and intentional promethazine overdose. Intubated. Admit to ICU  7/24 Tachycardic to 130s overnight, also with episode of whole body stiffening, ?dystonia vs. seizure. EEG with irritability, LTM initiated. Neuro consulted. 7/25 Extubated, intermittently somnolent/agitated, not managing secretions well. O2 sats stable on [place]. Cortrak difficult to flush. 7/26 Reintubated early AM for hypoxia, airway protection. Unasyn initiated for ?aspiration PNA. LTM EEG discontinued. MRI pending. 7/27 MRI no acute changes. Wakes up follows commands on .7 dex   Interim History / Subjective:  MRI without acute abnormality On .7 dex this morning   NAEO   Objective:  Blood pressure 106/69, pulse 83, temperature 99.4 F (37.4 C), temperature source Oral, resp. rate (!) 31, height 5' (1.524 m), weight 42.8 kg, last menstrual period 01/08/2011, SpO2 94 %.    Vent Mode: PRVC FiO2 (%):  [40 %] 40 % Set  Rate:  [18 bmp] 18 bmp Vt Set:  [360 mL] 360 mL PEEP:  [5 cmH20] 5 cmH20 Plateau Pressure:  [18 cmH20-22 cmH20] 18 cmH20   Intake/Output Summary (Last 24 hours) at 01/16/2022 1026 Last data filed at 01/16/2022 0800 Gross per 24 hour  Intake 900.06 ml  Output 995 ml  Net -94.94 ml   Filed Weights   01/14/22 0217 01/15/22 0600 01/16/22 0234  Weight: 43 kg 40.3 kg 42.8 kg   Physical Examination: General: critically and chronically ill appearing adult M appears older than stated age  49: NCAT pink mm anicteric sclera ETT secure  Neuro: Sedate. Following simple commands.  CV: rrr s1s2 cap refill brisk  PULM: Mechanically ventilated. Symmetrical chest expansion. Coarse sounds r>L  GI: Soft distended, hypoactive. Generalized mild tenderness  Extremities: no acute joint deformity no cyanosis or clubbing  Skin: c/d/w   Resolved Hospital Problem List:     Assessment & Plan:   Acute toxic metabolic encephalopathy in setting of intentional overdose Hx seizure  -MRI brain 7/27 no acute abnormalities  P -cont keppra 500 mg BID  -RASs goal 0  to  -1 -delirium precautions   SI with intentional promethazine overdose  Schizoaffective disorder, Bipolar type Depression Polysubstance use disorder  P -support care -Psych when medically appropriate  -phenobarb taper   Acute respiratory failure in setting of intentional overdose Aspiration event  Hx COPD without acute exacerbation Tobacco use disorder  - Extubated 7/25AM, unable to manage secretions throughout the day due to varying agitation/somnolence and inability to follow commands/redirect, reintubated 7/26 - -think her ileus contributed to reintubation P -WUA/SBT  -cont unasyn -BD -pulm hygiene  -  PRN CXR -ileus as below   Ileus  P -CT a/p  -cont bowel reg -- s/p 1x neostigmine   Hx HTN On dilt for this at home P -holding with recent hypotension   Iron deficiency anemia Notable hx of allergic rxn to iron  transfusion previously.  Required 2 units this admit.  No s/s of bleeding. P -cont Fe sup -PRN CBC   DM2  - SSI  Severe protein calorie malnutrition  Hx gastric bypass  History of gastric bypass ~2017, at that time weight 240lb. Weight down to 88lb, most significant weight loss over the last 3 years. P -EN per RDN   Best Practice (right click and "Reselect all SmartList Selections" daily)   Diet/type: NPO DVT prophylaxis: prophylactic heparin  GI prophylaxis: PPI Lines: N/A Foley:  N/A Code Status:  full code Last date of multidisciplinary goals of care discussion [Mother briefly updated at bedside 7/26AM, daughter [name]) updated via phone 7/26AM; [place] discussion pending]  CRITICAL CARE Performed by: [name]   Total critical care time: 40 minutes  Critical care time was exclusive of separately billable procedures and treating other patients. Critical care was necessary to treat or prevent imminent or life-threatening deterioration.  Critical care was time spent personally by me on the following activities: development of treatment plan with patient and/or surrogate as well as nursing, discussions with consultants, evaluation of patient's response to treatment, examination of patient, obtaining history from patient or surrogate, ordering and performing treatments and interventions, ordering and review of laboratory studies, ordering and review of radiographic studies, pulse oximetry and re-evaluation of patient's condition.  [name] Gum MSN, AGACNP-BC [place] for pager  01/16/2022, 10:26 AM

## 2022-01-16 NOTE — TOC Progression Note (Signed)
Transition of Care [facility]) - Progression Note    Patient Details  Name: [name] MRN: [id number] Date of Birth: Jun 13, 1966  Transition of Care [facility]) CM/SW [place], [place] Phone Number: 01/16/2022, 4:08 PM  Clinical Narrative:     CSW continues to follow patient. Patient currently IVC'd. TOC will continue to follow and assist with patients dc planning needs.    Barriers to Discharge: Continued Medical Work up  Expected Discharge Plan and Services     Discharge Planning Services: CM Consult   Living arrangements for the past 2 months: [place] Determinants of Health (SDOH) Interventions    Readmission Risk Interventions    07/27/2020   11:06 AM  Readmission Risk Prevention Plan  Transportation Screening Complete  PCP or Specialist Appt within 5-7 Days Complete  Home Care Screening Complete

## 2022-01-16 NOTE — Progress Notes (Signed)
PT Cancellation Note  Patient Details Name: [name] MRN: [id number] DOB: 11/29/1965   Cancelled Treatment:    Reason Eval/Treat Not Completed: Patient not medically ready (pt sedated without plans to wean per RN, intubated and not currently appropriate for therapy eval)   Chanise Habeck B Jonnatan Hanners 01/16/2022, 9:03 AM Bayard Males, PT Acute Rehabilitation Services Office: [phone]

## 2022-01-16 NOTE — Progress Notes (Signed)
RT and RN transported patient to MRI and back to 2H18 without event.  Patient suctioned prior to transport and after transport per VAP protocol.

## 2022-01-16 NOTE — Plan of Care (Addendum)
Patient transported from 2 H to CT scan on the vent without issue. RN and RT bedside. Return to ICU uneventful.

## 2022-01-17 DIAGNOSIS — R401 Stupor: Secondary | ICD-10-CM | POA: Diagnosis not present

## 2022-01-17 DIAGNOSIS — T50902A Poisoning by unspecified drugs, medicaments and biological substances, intentional self-harm, initial encounter: Secondary | ICD-10-CM | POA: Diagnosis not present

## 2022-01-17 LAB — CBC
HCT: 25.8 % — ABNORMAL LOW (ref 36.0–46.0)
Hemoglobin: 7.5 g/dL — ABNORMAL LOW (ref 12.0–15.0)
MCH: 19 pg — ABNORMAL LOW (ref 26.0–34.0)
MCHC: 29.1 g/dL — ABNORMAL LOW (ref 30.0–36.0)
MCV: 65.3 fL — ABNORMAL LOW (ref 80.0–100.0)
Platelets: 316 10*3/uL (ref 150–400)
RBC: 3.95 MIL/uL (ref 3.87–5.11)
RDW: 23.1 % — ABNORMAL HIGH (ref 11.5–15.5)
WBC: 14.6 10*3/uL — ABNORMAL HIGH (ref 4.0–10.5)
nRBC: 0 % (ref 0.0–0.2)

## 2022-01-17 LAB — COMPREHENSIVE METABOLIC PANEL
ALT: 14 U/L (ref 0–44)
AST: 30 U/L (ref 15–41)
Albumin: 1.9 g/dL — ABNORMAL LOW (ref 3.5–5.0)
Alkaline Phosphatase: 49 U/L (ref 38–126)
Anion gap: 9 (ref 5–15)
BUN: 16 mg/dL (ref 6–20)
CO2: 26 mmol/L (ref 22–32)
Calcium: 7.5 mg/dL — ABNORMAL LOW (ref 8.9–10.3)
Chloride: 105 mmol/L (ref 98–111)
Creatinine, Ser: 0.59 mg/dL (ref 0.44–1.00)
GFR, Estimated: >60 mL/min (ref >60)
Glucose, Bld: 144 mg/dL — ABNORMAL HIGH (ref 70–99)
Potassium: 3.2 mmol/L — ABNORMAL LOW (ref 3.5–5.1)
Sodium: 140 mmol/L (ref 135–145)
Total Bilirubin: 0.2 mg/dL — ABNORMAL LOW (ref 0.3–1.2)
Total Protein: 6.2 g/dL — ABNORMAL LOW (ref 6.5–8.1)

## 2022-01-17 LAB — GLUCOSE, CAPILLARY
Glucose-Capillary: 103 mg/dL — ABNORMAL HIGH (ref 70–99)
Glucose-Capillary: 113 mg/dL — ABNORMAL HIGH (ref 70–99)
Glucose-Capillary: 119 mg/dL — ABNORMAL HIGH (ref 70–99)
Glucose-Capillary: 136 mg/dL — ABNORMAL HIGH (ref 70–99)
Glucose-Capillary: 139 mg/dL — ABNORMAL HIGH (ref 70–99)
Glucose-Capillary: 154 mg/dL — ABNORMAL HIGH (ref 70–99)

## 2022-01-17 LAB — VITAMIN C: Vitamin C: 0.3 mg/dL — ABNORMAL LOW (ref 0.4–2.0)

## 2022-01-17 MED ORDER — SODIUM CHLORIDE 0.9% FLUSH: 10.0000 mL | INTRAVENOUS | Status: DC | PRN

## 2022-01-17 MED ORDER — SODIUM CHLORIDE 0.9% FLUSH
10.0000 mL | Freq: Two times a day (BID) | INTRAVENOUS | Status: DC
  Administered 2022-01-17 – 2022-01-19 (×6): 10 mL
  Administered 2022-01-20: 20 mL
  Administered 2022-01-20 – 2022-01-24 (×7): 10 mL
  Administered 2022-01-24: 20 mL
  Administered 2022-01-25 – 2022-01-28 (×7): 10 mL
  Administered 2022-01-29: 30 mL
  Administered 2022-01-29: 10 mL
  Administered 2022-01-30: 20 mL
  Administered 2022-01-30 – 2022-02-01 (×4): 10 mL
  Administered 2022-02-01: 40 mL
  Administered 2022-02-02 – 2022-02-11 (×15): 10 mL

## 2022-01-17 MED ORDER — POTASSIUM CHLORIDE 10 MEQ/100ML IV SOLN
10.0000 meq | INTRAVENOUS | Status: AC
  Administered 2022-01-17 (×4): 10 meq via INTRAVENOUS
  Filled 2022-01-17 (×4): qty 100

## 2022-01-17 MED ORDER — DIAZEPAM 5 MG/ML IJ SOLN
5.0000 mg | INTRAMUSCULAR | Status: DC | PRN
  Administered 2022-01-18: 5 mg via INTRAVENOUS
  Filled 2022-01-17: qty 2

## 2022-01-17 MED ORDER — POTASSIUM CHLORIDE 20 MEQ PO PACK
20.0000 meq | PACK | ORAL | Status: AC
  Administered 2022-01-17 (×2): 20 meq
  Filled 2022-01-17 (×2): qty 1

## 2022-01-17 MED ORDER — METHYLNALTREXONE BROMIDE 12 MG/0.6ML ~~LOC~~ SOLN
12.0000 mg | Freq: Once | SUBCUTANEOUS | Status: AC
  Administered 2022-01-17: 12 mg via SUBCUTANEOUS
  Filled 2022-01-17: qty 0.6

## 2022-01-17 NOTE — TOC Initial Note (Signed)
Transition of Care [facility]) - Initial/Assessment Note    Patient Details  Name: [name] MRN: [id number] Date of Birth: 03/09/1966  Transition [facility]) CM/SW Contact:    Milas Gain, [place] Phone Number: 01/17/2022, 1:10 PM  Clinical Narrative:                  CSW received consult for possible SNF placement at time of discharge. Due to patients current orientation CSW spoke with patients daughter [name] regarding PT recommendation of SNF placement at time of discharge for patient. Patients daughter reports patient comes from home with brother. Patients brother lives with patient. Patients daughter expressed understanding of PT recommendation and declined SNF placement for patient when patient is medically ready for dc. Patients daughter would like for patient to dc to inpatient psych when medically ready for dc. Patient is currently IVC'd. No further questions reported at this time. CSW to continue to follow and assist with discharge planning needs.   Expected Discharge Plan:  (inpatient psych) Barriers to Discharge: Continued Medical Work up   Patient Goals and CMS Choice   CMS Medicare.gov Compare Post Acute Care list provided to:: Patient Represenative (must comment) (Patients daughter [name]) Choice offered to / list presented to : Adult Children (Patients daughter [name])  Expected Discharge Plan and Services Expected Discharge Plan:  (inpatient psych) In-house Referral: Clinical Social Work Discharge Planning Services: CM Consult   Living arrangements for the past 2 months: Single Family Home                                      Prior Living Arrangements/Services Living arrangements for the past 2 months: Single Family Home Lives with:: Relatives (Ricky patients uncle lives with her) Patient language and need for interpreter reviewed:: Yes Do you feel safe going back to the place where you live?: No   Inpatient Psych  Need for Family Participation in  Patient Care: Yes (Comment) Care giver support system in place?: Yes (comment)   Criminal Activity/Legal Involvement Pertinent to Current Situation/Hospitalization: No - Comment as needed  Activities of Daily Living      Permission Sought/Granted Permission sought to share information with : Case Manager, Family Supports, Customer service manager Permission granted to share information with : No  Share Information with NAME: Due to patients current orientation spoke with daughter [name]  Permission granted to share info w AGENCY: [facility]/inpatient psych  Permission granted to share info w Relationship: daughter  Permission granted to share info w Contact Information: [name] [phone]  Emotional Assessment Appearance:: Appears stated age Attitude/Demeanor/Rapport: Intubated (Following Commands or Not Following Commands)   Orientation: :  (Intubated/trach) Alcohol / Substance Use: Not Applicable Psych Involvement: No (comment), Yes (comment)  Admission diagnosis:  Obtunded [R40.1] Intentional overdose ([place]) [T50.902A] Intentional overdose, initial encounter ([place]) [T50.902A] Patient Active Problem List   Diagnosis Date Noted   Protein-calorie malnutrition, severe 01/14/2022   Intentional overdose ([place]) 01/12/2022   Acute respiratory failure with hypoxia ([place]) 11/14/2021   Microcytic anemia 11/14/2021   Polysubstance (excluding opioids) dependence, daily use ([place]) 08/04/2018   Substance induced mood disorder ([place]) 08/03/2018   Schizoaffective disorder, bipolar type ([place]) 11/13/2017   Polysubstance abuse ([place]) 11/13/2017   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   Type 2 diabetes mellitus with diabetic polyneuropathy, with long-term current use of insulin ([place]) 09/06/2015   Essential hypertension 11/04/2011  COPD (chronic obstructive pulmonary disease) ([place]) 09/29/2011   PCP:  [name], MD Pharmacy:   [facility] [address], [place] [address] Phone: [phone] Fax: [phone]     Social Determinants of Health (SDOH) Interventions    Readmission Risk Interventions    07/27/2020   11:06 AM  Readmission Risk Prevention Plan  Transportation Screening Complete  PCP or Specialist Appt within 5-7 Days Complete  Home Care Screening Complete

## 2022-01-17 NOTE — Progress Notes (Signed)
Pt placed back on full vent support due to tachypnea and increased WOB. Pt tolerating well at this time, RN aware, RT will monitor.

## 2022-01-17 NOTE — Progress Notes (Signed)
[facility] ADULT ICU REPLACEMENT PROTOCOL   The patient does apply for the [facility] Adult ICU Electrolyte Replacment Protocol based on the criteria listed below:   1.Exclusion criteria: TCTS patients, ECMO patients, and Dialysis patients 2. Is GFR >/= 30 ml/min? Yes.    Patient's GFR today is >60 3. Is SCr </= 2? Yes.   Patient's SCr is 0.59 mg/dL 4. Did SCr increase >/= 0.5 in 24 hours? No. 5.Pt's weight >40kg  Yes.   6. Abnormal electrolyte(s): K 3.2  7. Electrolytes replaced per protocol 8.  Call MD STAT for K+ </= 2.5, Phos </= 1, or Mag </= 1 Physician:    Ronda Fairly A 01/17/2022 4:25 AM

## 2022-01-17 NOTE — Evaluation (Signed)
Occupational Therapy Evaluation Patient Details Name: [name] MRN: [id number] DOB: 06/05/1966 Today's Date: 01/17/2022   History of Present Illness 56 y.o. female admitted 01/12/22 after suicide attempt with intentional promethazine OD. EEG without seizures; showed evidence of epileptogenicity. ETT 7/23-7/25. Reintubated 7/26. Ileus. PMHx:Afib, COPD, DM, neuropathy, HTN, bipolar disorder, schizophrenia, anxiety, depression, ETOH abuse.   Clinical Impression   PT admitted with suicide attempt. Pt currently with functional limitiations due to the deficits listed below (see OT problem list). Pt currently intubated with sedation requiring total +2 (A) for all aspects of care. Pt depend for all adls at this time.  Pt will benefit from skilled OT to increase their independence and safety with adls and balance to allow discharge SNF.       Recommendations for follow up therapy are one component of a multi-disciplinary discharge planning process, led by the attending physician.  Recommendations may be updated based on patient status, additional functional criteria and insurance authorization.   Follow Up Recommendations  Acute inpatient rehab (3hours/day)    Assistance Recommended at Discharge Frequent or constant Supervision/Assistance  Patient can return home with the following Two people to help with walking and/or transfers;Two people to help with bathing/dressing/bathroom    Functional Status Assessment  Patient has had a recent decline in their functional status and demonstrates the ability to make significant improvements in function in a reasonable and predictable amount of time.  Equipment Recommendations  Other (comment) (TBA)    Recommendations for Other Services       Precautions / Restrictions Precautions Precautions: Other (comment) Precaution Comments: cortrak, ETT 23cm, vent, bil wrist restraints, NGT Restrictions Weight Bearing Restrictions: No      Mobility Bed  Mobility Overal bed mobility: Needs Assistance Bed Mobility: Supine to Sit, Sit to Supine     Supine to sit: Total assist, +2 for safety/equipment Sit to supine: Total assist, +2 for safety/equipment   General bed mobility comments: total assist to pivot to EOB and back to bed x 2 with total assist for sitting balance. +2 total assist to slide toward [facility]    Transfers                   General transfer comment: not currently able      Balance Overall balance assessment: Needs assistance Sitting-balance support: Feet supported Sitting balance-Leahy Scale: Zero Sitting balance - Comments: total assist for sitting EOB                                   ADL either performed or assessed with clinical judgement   ADL Overall ADL's : Needs assistance/impaired                                       General ADL Comments: total (A) for care. pt noted to have drainage from rectum of stool. Pt with new pad applied     Vision   Additional Comments: unable to access this session.     Perception     Praxis      Pertinent Vitals/Pain Pain Assessment Pain Assessment: CPOT Facial Expression: Relaxed, neutral Body Movements: Absence of movements Muscle Tension: Relaxed Compliance with ventilator (intubated pts.): Tolerating ventilator or movement Vocalization (extubated pts.): N/A CPOT Total: 0     Hand Dominance Right   Extremity/Trunk Assessment Upper  Extremity Assessment Upper Extremity Assessment: Generalized weakness   Lower Extremity Assessment Lower Extremity Assessment: Generalized weakness   Cervical / Trunk Assessment Cervical / Trunk Assessment: Other exceptions Cervical / Trunk Exceptions: right neck rotation toward vent   Communication Communication Communication: Other (comment) (intubated)   Cognition Arousal/Alertness: Lethargic Behavior During Therapy: Flat affect Overall Cognitive Status: Impaired/Different from  baseline                                 General Comments: pt lethargic with minimal automatic movement and eye lid opening but unable to visually track, not following commands. pt does open eyes to loud sound     General Comments  PS/CPAP 40% FiO2, peep 5 . SPo2 98%    Exercises     Shoulder Instructions      Home Living Family/patient expects to be discharged to:: Private residence Living Arrangements: Alone   Type of Home: House Home Access: Stairs to enter Technical brewer of Steps: 6   Home Layout: One level     Bathroom Shower/Tub: Occupational psychologist: Standard     Home Equipment: Radio producer - single point   Additional Comments: spouse provided home setup and PLOF      Prior Functioning/Environment Prior Level of Function : Independent/Modified Independent                        OT Problem List: Impaired balance (sitting and/or standing);Decreased activity tolerance;Decreased range of motion;Decreased cognition;Decreased safety awareness;Decreased knowledge of use of DME or AE;Decreased knowledge of precautions;Cardiopulmonary status limiting activity      OT Treatment/Interventions: Self-care/ADL training;Therapeutic exercise;Neuromuscular education;DME and/or AE instruction;Energy conservation;Manual therapy;Therapeutic activities;Cognitive remediation/compensation;Patient/family education;Balance training    OT Goals(Current goals can be found in the care plan section) Acute Rehab OT Goals Patient Stated Goal: unable tp participate OT Goal Formulation: Patient unable to participate in goal setting Time For Goal Achievement: 01/31/22 Potential to Achieve Goals: Fair  OT Frequency: Min 2X/week    Co-evaluation PT/OT/SLP Co-Evaluation/Treatment: Yes Reason for Co-Treatment: Necessary to address cognition/behavior during functional activity;For patient/therapist safety;To address functional/ADL transfers;Complexity of the  patient's impairments (multi-system involvement)   OT goals addressed during session: ADL's and self-care;Proper use of Adaptive equipment and DME;Strengthening/ROM      AM-PAC OT "6 Clicks" Daily Activity     Outcome Measure Help from another person eating meals?: Total Help from another person taking care of personal grooming?: Total Help from another person toileting, which includes using toliet, bedpan, or urinal?: Total Help from another person bathing (including washing, rinsing, drying)?: Total Help from another person to put on and taking off regular upper body clothing?: Total Help from another person to put on and taking off regular lower body clothing?: Total 6 Click Score: 6   End of Session Equipment Utilized During Treatment: Oxygen Nurse Communication: Mobility status;Precautions  Activity Tolerance: Patient tolerated treatment well Patient left: in bed;with call bell/phone within reach;with bed alarm set;with restraints reapplied;with SCD's reapplied  OT Visit Diagnosis: Unsteadiness on feet (R26.81);Muscle weakness (generalized) (M62.81)                Time: 7829-5621 OT Time Calculation (min): 12 min Charges:  OT General Charges $OT Visit: 1 Visit OT Evaluation $OT Eval High Complexity: 1 High   Brynn, OTR/L  Acute Rehabilitation Services Office: [phone] .   Jeri Modena 01/17/2022, 2:43 PM

## 2022-01-17 NOTE — Evaluation (Addendum)
Physical Therapy Evaluation Patient Details Name: [name] MRN: [id number] DOB: 11/15/1965 Today's Date: 01/17/2022  History of Present Illness  56 y.o. female admitted 01/12/22 after suicide attempt with intentional promethazine OD. EEG without seizures; showed evidence of epileptogenicity. ETT 7/23-7/25. Reintubated 7/26. Ileus. PMHx:Afib, COPD, DM, neuropathy, HTN, bipolar disorder, schizophrenia, anxiety, depression, ETOH abuse.  Clinical Impression  Pt remains on precedex and vent support with limited automatic movement and limited eye opening. Pt able to tolerate transition to sitting EOB and family able to provide home setup via phone. PTA pt using cane at times and living alone. Pt will benefit from acute therapy to maximize mobility, safety and function to decrease burden of care.  PS/CPAP 40% FiO2, peep 5 . SPo2 98%     Recommendations for follow up therapy are one component of a multi-disciplinary discharge planning process, led by the attending physician.  Recommendations may be updated based on patient status, additional functional criteria and insurance authorization.  Follow Up Recommendations Skilled nursing-short term rehab (<3 hours/day) Can patient physically be transported by private vehicle: No    Assistance Recommended at Discharge Frequent or constant Supervision/Assistance  Patient can return home with the following  A lot of help with walking and/or transfers;A lot of help with bathing/dressing/bathroom;Assist for transportation;Help with stairs or ramp for entrance    Equipment Recommendations Other (comment) (TBD with progression)  Recommendations for Other Services       Functional Status Assessment Patient has had a recent decline in their functional status and demonstrates the ability to make significant improvements in function in a reasonable and predictable amount of time.     Precautions / Restrictions Precautions Precautions: Other  (comment) Precaution Comments: cortrak, ETT 23cm, vent, bil wrist restraints, NGT      Mobility  Bed Mobility Overal bed mobility: Needs Assistance Bed Mobility: Supine to Sit, Sit to Supine     Supine to sit: Total assist, +2 for safety/equipment Sit to supine: Total assist, +2 for safety/equipment   General bed mobility comments: total assist to pivot to EOB and back to bed x 2 with total assist for sitting balance. +2 total assist to slide toward [facility]    Transfers                   General transfer comment: not currently able    Ambulation/Gait                  Stairs            Wheelchair Mobility    Modified Rankin (Stroke Patients Only)       Balance Overall balance assessment: Needs assistance Sitting-balance support: Feet supported Sitting balance-Leahy Scale: Zero Sitting balance - Comments: total assist for sitting EOB                                     Pertinent Vitals/Pain Pain Assessment Pain Assessment: CPOT Facial Expression: Relaxed, neutral Body Movements: Absence of movements Muscle Tension: Relaxed Compliance with ventilator (intubated pts.): Tolerating ventilator or movement Vocalization (extubated pts.): N/A CPOT Total: 0    Home Living Family/patient expects to be discharged to:: Private residence Living Arrangements: Alone   Type of Home: House Home Access: Stairs to enter   Technical brewer of Steps: 6   Home Layout: One level Home Equipment: Cane - single point Additional Comments: spouse provided home setup and PLOF  Prior Function Prior Level of Function : Independent/Modified Independent                     Hand Dominance        Extremity/Trunk Assessment   Upper Extremity Assessment Upper Extremity Assessment: Generalized weakness    Lower Extremity Assessment Lower Extremity Assessment: Generalized weakness    Cervical / Trunk Assessment Cervical / Trunk  Assessment: Other exceptions Cervical / Trunk Exceptions: right neck rotation toward vent  Communication   Communication: Other (comment) (intubated)  Cognition Arousal/Alertness: Lethargic Behavior During Therapy: Flat affect Overall Cognitive Status: Impaired/Different from baseline                                 General Comments: pt lethargic with minimal automatic movement and eye lid opening but unable to visually track, not following commands        General Comments      Exercises     Assessment/Plan    PT Assessment Patient needs continued PT services  PT Problem List Decreased strength;Decreased mobility;Decreased activity tolerance;Decreased cognition;Cardiopulmonary status limiting activity;Decreased balance       PT Treatment Interventions Therapeutic exercise;Gait training;Balance training;Functional mobility training;Stair training;Neuromuscular re-education;Cognitive remediation;Therapeutic activities;Patient/family education;DME instruction    PT Goals (Current goals can be found in the Care Plan section)  Acute Rehab PT Goals PT Goal Formulation: Patient unable to participate in goal setting Time For Goal Achievement: 01/31/22 Potential to Achieve Goals: Fair    Frequency Min 2X/week     Co-evaluation PT/OT/SLP Co-Evaluation/Treatment: Yes Reason for Co-Treatment: Complexity of the patient's impairments (multi-system involvement);For patient/therapist safety PT goals addressed during session: Mobility/safety with mobility         AM-PAC PT "6 Clicks" Mobility  Outcome Measure Help needed turning from your back to your side while in a flat bed without using bedrails?: Total Help needed moving from lying on your back to sitting on the side of a flat bed without using bedrails?: Total Help needed moving to and from a bed to a chair (including a wheelchair)?: Total Help needed standing up from a chair using your arms (e.g., wheelchair or  bedside chair)?: Total Help needed to walk in hospital room?: Total Help needed climbing 3-5 steps with a railing? : Total 6 Click Score: 6    End of Session   Activity Tolerance: Patient limited by lethargy Patient left: in bed;with call bell/phone within reach;with bed alarm set Nurse Communication: Mobility status PT Visit Diagnosis: Other abnormalities of gait and mobility (R26.89);Difficulty in walking, not elsewhere classified (R26.2);Muscle weakness (generalized) (M62.81)    Time: 8315-1761 PT Time Calculation (min) (ACUTE ONLY): 12 min   Charges:   PT Evaluation $PT Eval High Complexity: 1 High          Celes Dedic P, PT Acute Rehabilitation Services Office: [phone]   Juluis Fitzsimmons B Costella Schwarz 01/17/2022, 10:10 AM

## 2022-01-17 NOTE — Progress Notes (Signed)
NAME:  [name], MRN:  [id number], DOB:  1966-06-04, LOS: 5 ADMISSION DATE:  01/12/2022, CONSULTATION DATE:  01/12/21 REFERRING MD:  Sedonia Small - EM, CHIEF COMPLAINT:  Intentional Overdose    History of Present Illness:  56 yo F PMH schizoaffective disorder (bipolar type), Depression, prior SI, polysubstance use disorder, COPD, HTN, DM presented to the ED following suicide attempt. Patient had been drinking, while with friends stated that she was going to kill herself and was witnessed ingesting a bottle of promethazine.   In transport with EMS became more somnolent. Arrived to ED with GCS 3 and was intubated in this setting. EM discussed case with poison control who recommended supportive care  PCCM consulted for admission in this setting.  Pertinent Medical History:  Schizoaffective disorder, bipolar type Depression Polysubstance abuse  COPD HTN DM    Significant Hospital Events: Including procedures, antibiotic start and stop dates in addition to other pertinent events   7/23 ED after etoh consumption and intentional promethazine overdose. Intubated. Admit to ICU  7/24 Tachycardic to 130s overnight, also with episode of whole body stiffening, ?dystonia vs. seizure. EEG with irritability, LTM initiated. Neuro consulted. 7/25 Extubated, intermittently somnolent/agitated, not managing secretions well. O2 sats stable on [place]. Cortrak difficult to flush. 7/26 Reintubated early AM for hypoxia, airway protection. Unasyn initiated for ?aspiration PNA. LTM EEG discontinued. MRI pending. 7/27 MRI no acute changes. Wakes up follows commands on .7 dex  7/28 off NE   Interim History / Subjective:  Off levo overnight  Abd more distended today   + flatus and + smear overnight   Objective:  Blood pressure 93/60, pulse 99, temperature 100 F (37.8 C), temperature source Oral, resp. rate 20, height 5' (1.524 m), weight 43 kg, last menstrual period 01/08/2011, SpO2 96 %.    Vent Mode:  PSV;CPAP FiO2 (%):  [40 %] 40 % Set Rate:  [18 bmp] 18 bmp Vt Set:  [360 mL] 360 mL PEEP:  [5 cmH20] 5 cmH20 Pressure Support:  [12 cmH20-15 cmH20] 15 cmH20 Plateau Pressure:  [20 cmH20-22 cmH20] 22 cmH20   Intake/Output Summary (Last 24 hours) at 01/17/2022 0809 Last data filed at 01/17/2022 0600 Gross per 24 hour  Intake 2285.53 ml  Output 2460 ml  Net -174.47 ml   Filed Weights   01/15/22 0600 01/16/22 0234 01/17/22 0312  Weight: 40.3 kg 42.8 kg 43 kg   Physical Examination: General: Chronically and critically ill appearing adult F appears older than stated age intubated sedated  HEENT: ETT secure OGT, cortrak in place. Anicteric sclera Neuro: Moving BUE BLE spontaneously. Opens eyes to stimulation. Nodding/shaking head to questions. On precedex  CV: rrr cap refill brisk  PULM: Mechanically ventilated. Symmetrical chest expansion  GI: Distended, soft but more tense than prior. + bowel sounds Extremities: no acute joint deformity  Skin: c/d/w   Resolved Hospital Problem List:     Assessment & Plan:    Acute toxic metabolic encephalopathy, initially in setting of intentional overdose  Hx seizure  -MRI brain 7/27 no acute abnormalities  P -Keppra '500mg'$  BID  -RASS 0 to -1  -- precedex  -delirium precautions   SI with intentional promethazine overdose Schizoaffective disorder -- Bipolar type MDD  Polysubstance use disorder  P -cont phenobarb taper -psych as medically appropriate; will need IVC post-extubation   Acute respiratory failure with hypoxia initially in setting of overdose, then 2/2 aspiration  Aspiration Event Hx COPD without acute exacerbation  Tobacco use disorder  - Extubated 7/25AM,  unable to manage secretions throughout the day due to varying agitation/somnolence and inability to follow commands/redirect, reintubated 7/26 - -think her ileus contributed to reintubation P -WUA/SBT  -cont unasyn -BD -pulm hygiene  -PRN CXR -ileus as below    Hypotension, suspect sedation related  -- improving P -off NE -- will dc  -MAP goal > 65   Ileus  -7/28 starting to have flatus  P -will try relistor   Hx HTN  On dilt for this at home P -holding home meds   Hypokalemia P -replace   Iron deficiency anemia Notable hx of allergic rxn to iron transfusion previously.  Required 2 units this admit.  No s/s of bleeding. P -cont Fe sup -PRN CBC   DM2  - SSI  Severe protein calorie malnutrition  Hx gastric bypass  History of gastric bypass ~2017, at that time weight 240lb. Weight down to 88lb, most significant weight loss over the last 3 years. P -EN per RDN   Best Practice (right click and "Reselect all SmartList Selections" daily)   Diet/type: NPO DVT prophylaxis: prophylactic heparin  GI prophylaxis: PPI Lines: N/A Foley:  N/A Code Status:  full code Last date of multidisciplinary goals of care discussion [Mother briefly updated at bedside 7/26AM, daughter [name]) updated via phone 7/26AM; Leslie discussion pending]  CRITICAL CARE Performed by: Cristal Generous   Total critical care time: 38 minutes  Critical care time was exclusive of separately billable procedures and treating other patients.  Critical care was necessary to treat or prevent imminent or life-threatening deterioration.  Critical care was time spent personally by me on the following activities: development of treatment plan with patient and/or surrogate as well as nursing, discussions with consultants, evaluation of patient's response to treatment, examination of patient, obtaining history from patient or surrogate, ordering and performing treatments and interventions, ordering and review of laboratory studies, ordering and review of radiographic studies, pulse oximetry and re-evaluation of patient's condition.  Eliseo Gum MSN, AGACNP-BC [place] for pager  01/17/2022, 8:09 AM

## 2022-01-17 NOTE — Progress Notes (Signed)
Nutrition Follow-up  DOCUMENTATION CODES:   Underweight, Severe malnutrition in context of chronic illness  INTERVENTION:   Recommend initiation of TPN at this time. RD discussed with PCCM and plan to initiate today  Monitor magnesium, potassium, and phosphorus BID for at least 3 days, MD to replete as needed, as pt is at risk for refeeding syndrome given severe malnutrition, no nutrition since admission  TPN to meet nutritional needs; TPN orders per Pharmacy. MVI and trace mineral supplement via TPN  Consider addition of pro-motility agent such as reglan or erythromycin   Plan for trial of trickle TF once ileus resolved and pt with good amount of stool output (lots of stool throughout colon)  Continue IV Thiamine, B-12 injections  Add Vitamin A and D supplementation once able to take po  NUTRITION DIAGNOSIS:   Severe Malnutrition related to chronic illness (hx gastric bypass, COPD, polysubtance abuse) as evidenced by severe fat depletion, severe muscle depletion.  Being addressed via TPN  GOAL:   Patient will meet greater than or equal to 90% of their needs  Progressing  MONITOR:   TF tolerance, Vent status, Labs, Weight trends  REASON FOR ASSESSMENT:   Consult, Ventilator Enteral/tube feeding initiation and management  ASSESSMENT:   56 yo female admitted with acute encephalopathy in setting of intentional OD with SI, intubated for airway protection. PMH includes COPD, HTN, DM, schizoaffective disorder, depression. Pt with hx of gastric bypass in 2017, sig weight loss  7/23 Admitted, Intubated, TF started via Protocol 7/25 Extubated, TF held, Cortrak placed, kinked and could not use, re-intubated, TF not started 7/26 Cortrak TF adjusted by RD, flushed, TF remains on hold 7/27 CT abdomen: Large amount of stool in the ascending colon.Moderate amount of stool in the descending colon. Gaseous distension of the transverse colon. Mild distension of the small bowel.  Pt  remains on vent support, currently on precedex gtt  NPO Abdomen remains distended/taut even post +stool this AM. Per RN, abdomen is more distended than yesterday. OG output minimal but is orange in color, not bilious, resembling the potassium administered per tube this AM.   Cortrak and OG remain in place  Current bowel regimen includes colace, dulcolax suppository. Relistor x 1 today  Current wt: 43 kg Admit weight: 42.8 kg  UOP >2 L, essentially net even for 24 hour  Micronutrient Panel: CRP: 9.1 (H) Copper:129 (wdl) Thiamine: 107.8 (wdl) Vitamin A: 14.7 (L) Vitamin C: 0.3 (L) Vitamin D: 10.32 (L) Vitamin B12: 193 (L-normal)-likely low once inflammation taken into account Zinc: 48 (wdl)  Labs: potassium 3.2, CBGs 103-158, Creatinine/BUN wdl, sodium wdl, mag/phos wdl yesterday Meds: B12 injections   Diet Order:   Diet Order             Diet NPO time specified  Diet effective now                   EDUCATION NEEDS:   Not appropriate for education at this time  Skin:  Skin Assessment: Reviewed RN Assessment  Last BM:  7/28  Height:   Ht Readings from Last 1 Encounters:  01/12/22 5' (1.524 m)    Weight:   Wt Readings from Last 1 Encounters:  01/17/22 43 kg    BMI:  Body mass index is 18.51 kg/m.  Estimated Nutritional Needs:   Kcal:  1600-1800 kcals  Protein:  80-90 g  Fluid:  >/= 1.6 L  Kerman Passey MS, RDN, LDN, CNSC Registered Dietitian 3 Clinical Nutrition RD Pager  and Forensic scientist Number Located in [place]

## 2022-01-17 NOTE — Progress Notes (Signed)
Peripherally Inserted Central Catheter Placement  The IV Nurse has discussed with the patient and/or persons authorized to consent for the patient, the purpose of this procedure and the potential benefits and risks involved with this procedure.  The benefits include less needle sticks, lab draws from the catheter, and the patient may be discharged home with the catheter. Risks include, but not limited to, infection, bleeding, blood clot (thrombus formation), and puncture of an artery; nerve damage and irregular heartbeat and possibility to perform a PICC exchange if needed/ordered by physician.  Alternatives to this procedure were also discussed.  Bard Power PICC patient education guide, fact sheet on infection prevention and patient information card has been provided to patient /or left at bedside.    PICC Placement Documentation  PICC Triple Lumen 04/16/84 Right Basilic 37 cm 0 cm (Active)  Indication for Insertion or Continuance of Line Vasoactive infusions 01/17/22 0846  Exposed Catheter (cm) 0 cm 01/17/22 0846  Site Assessment Clean, Dry, Intact 01/17/22 0846  Lumen #1 Status Flushed;Saline locked;Blood return noted 01/17/22 0846  Lumen #2 Status Flushed;Saline locked;Blood return noted 01/17/22 0846  Lumen #3 Status Flushed;Saline locked;Blood return noted 01/17/22 0846  Dressing Type Transparent;Securing device 01/17/22 0846  Dressing Status Antimicrobial disc in place 01/17/22 0846  Dressing Intervention New dressing;Other (Comment) 01/17/22 0846  Dressing Change Due 01/24/22 01/17/22 0846       Christella Noa Albarece 01/17/2022, 8:48 AM

## 2022-01-17 NOTE — Progress Notes (Signed)
She continues to improve, and has negative MRI. I would continue Keppra until she is extubated and seizures were therefore be more readily apparent.  I would also repeat a 20 minute EEG prior to discontinuing keppra.  On my exam today, she was sedated but did wiggle toes to commands.  Neurology will follow from [place], please call with further questions in the interim.   Roland Rack, MD [facility] [phone]  If 7pm- 7am, please page neurology on call as listed in [place].

## 2022-01-17 NOTE — Progress Notes (Signed)
Inpatient Rehab Admissions Coordinator:   Per OT recommendations pt was screened for CIR by Shann Medal, PT, DPT.  Note plans for d/c to inpatient psych at discharge.  No CIR consult recommended at this time.   Shann Medal, PT, DPT Admissions Coordinator [phone] 01/17/22  3:55 PM

## 2022-01-17 NOTE — Progress Notes (Signed)
eLink Physician-Brief Progress Note Patient Name: [name] DOB: 11-27-1965 MRN: [id number]   Date of Service  01/17/2022  HPI/Events of Note  56 y/o F, here with Acute toxic metabolic encephalopathy,  intentional overdose , Hx seizure , had MRI brain 7/27 no acute abnormalities . Remains intubated due to COPD, aspiration, Ileus recently contributing to re-intubation.   E-Link called for agitation on vent. Has been on Max Precedex but Phenobarb has been being weaned off. Does have hx of ETOH + cocaine abuse.      eICU Interventions   -Keppra '500mg'$  BID  -RASS 0 to -1  -- precedex  -delirium precautions  -hx of Schizoaffective disorder -- Bipolar type+ Polysubstance use disorder  -cont phenobarb taper -cont Precedex  -Valium IV prn for longer action  -will monitor. If ineffective can add another infusion         [name] N Dwyane Dupree 01/17/2022, 10:41 PM

## 2022-01-18 ENCOUNTER — Inpatient Hospital Stay (HOSPITAL_COMMUNITY): Payer: Medicare Other

## 2022-01-18 DIAGNOSIS — T50902D Poisoning by unspecified drugs, medicaments and biological substances, intentional self-harm, subsequent encounter: Secondary | ICD-10-CM

## 2022-01-18 LAB — GLUCOSE, CAPILLARY
Glucose-Capillary: 58 mg/dL — ABNORMAL LOW (ref 70–99)
Glucose-Capillary: 123 mg/dL — ABNORMAL HIGH (ref 70–99)
Glucose-Capillary: 69 mg/dL — ABNORMAL LOW (ref 70–99)
Glucose-Capillary: 106 mg/dL — ABNORMAL HIGH (ref 70–99)
Glucose-Capillary: 111 mg/dL — ABNORMAL HIGH (ref 70–99)
Glucose-Capillary: 133 mg/dL — ABNORMAL HIGH (ref 70–99)
Glucose-Capillary: 163 mg/dL — ABNORMAL HIGH (ref 70–99)

## 2022-01-18 LAB — CBC
HCT: 22.3 % — ABNORMAL LOW (ref 36.0–46.0)
HCT: 21.5 % — ABNORMAL LOW (ref 36.0–46.0)
Hemoglobin: 6.5 g/dL — CL (ref 12.0–15.0)
Hemoglobin: 6.2 g/dL — CL (ref 12.0–15.0)
MCH: 19.2 pg — ABNORMAL LOW (ref 26.0–34.0)
MCH: 19 pg — ABNORMAL LOW (ref 26.0–34.0)
MCHC: 29.1 g/dL — ABNORMAL LOW (ref 30.0–36.0)
MCHC: 28.8 g/dL — ABNORMAL LOW (ref 30.0–36.0)
MCV: 65.8 fL — ABNORMAL LOW (ref 80.0–100.0)
MCV: 65.7 fL — ABNORMAL LOW (ref 80.0–100.0)
Platelets: 272 10*3/uL (ref 150–400)
Platelets: 257 10*3/uL (ref 150–400)
RBC: 3.39 MIL/uL — ABNORMAL LOW (ref 3.87–5.11)
RBC: 3.27 MIL/uL — ABNORMAL LOW (ref 3.87–5.11)
RDW: 23.7 % — ABNORMAL HIGH (ref 11.5–15.5)
RDW: 23.6 % — ABNORMAL HIGH (ref 11.5–15.5)
WBC: 8 10*3/uL (ref 4.0–10.5)
WBC: 7.4 10*3/uL (ref 4.0–10.5)
nRBC: 0 % (ref 0.0–0.2)
nRBC: 0 % (ref 0.0–0.2)

## 2022-01-18 LAB — COMPREHENSIVE METABOLIC PANEL
ALT: 15 U/L (ref 0–44)
AST: 23 U/L (ref 15–41)
Albumin: 1.7 g/dL — ABNORMAL LOW (ref 3.5–5.0)
Alkaline Phosphatase: 38 U/L (ref 38–126)
Anion gap: 5 (ref 5–15)
BUN: 8 mg/dL (ref 6–20)
CO2: 27 mmol/L (ref 22–32)
Calcium: 7.5 mg/dL — ABNORMAL LOW (ref 8.9–10.3)
Chloride: 113 mmol/L — ABNORMAL HIGH (ref 98–111)
Creatinine, Ser: 0.3 mg/dL — ABNORMAL LOW (ref 0.44–1.00)
GFR, Estimated: >60 mL/min (ref >60)
Glucose, Bld: 75 mg/dL (ref 70–99)
Potassium: 3.2 mmol/L — ABNORMAL LOW (ref 3.5–5.1)
Sodium: 145 mmol/L (ref 135–145)
Total Bilirubin: 0.3 mg/dL (ref 0.3–1.2)
Total Protein: 6 g/dL — ABNORMAL LOW (ref 6.5–8.1)

## 2022-01-18 LAB — PHOSPHORUS: Phosphorus: 2.5 mg/dL (ref 2.5–4.6)

## 2022-01-18 LAB — TRIGLYCERIDES: Triglycerides: 75 mg/dL (ref <150)

## 2022-01-18 LAB — MAGNESIUM: Magnesium: 2.3 mg/dL (ref 1.7–2.4)

## 2022-01-18 LAB — PREPARE RBC (CROSSMATCH)

## 2022-01-18 MED ORDER — DEXTROSE 50 % IV SOLN
12.5000 g | INTRAVENOUS | Status: AC
  Administered 2022-01-18: 12.5 g via INTRAVENOUS
  Filled 2022-01-18: qty 50

## 2022-01-18 MED ORDER — POTASSIUM CHLORIDE 10 MEQ/50ML IV SOLN
10.0000 meq | INTRAVENOUS | Status: AC
  Administered 2022-01-18 (×4): 10 meq via INTRAVENOUS
  Filled 2022-01-18 (×3): qty 50

## 2022-01-18 MED ORDER — POTASSIUM PHOSPHATES 15 MMOLE/5ML IV SOLN
15.0000 mmol | Freq: Once | INTRAVENOUS | Status: AC
  Administered 2022-01-18: 15 mmol via INTRAVENOUS
  Filled 2022-01-18: qty 5

## 2022-01-18 MED ORDER — DEXTROSE 50 % IV SOLN
INTRAVENOUS | Status: AC
  Administered 2022-01-18: 12.5 g via INTRAVENOUS
  Filled 2022-01-18: qty 50

## 2022-01-18 MED ORDER — DEXTROSE 50 % IV SOLN: 12.5000 g | INTRAVENOUS | Status: AC

## 2022-01-18 MED ORDER — MIDAZOLAM HCL 2 MG/2ML IJ SOLN
2.0000 mg | Freq: Once | INTRAMUSCULAR | Status: AC
  Administered 2022-01-18: 2 mg via INTRAVENOUS
  Filled 2022-01-18: qty 2

## 2022-01-18 MED ORDER — POTASSIUM CHLORIDE 20 MEQ PO PACK
20.0000 meq | PACK | ORAL | Status: DC
  Filled 2022-01-18: qty 1

## 2022-01-18 MED ORDER — SODIUM CHLORIDE 0.9% IV SOLUTION
Freq: Once | INTRAVENOUS | Status: AC
Start: 2022-01-18 — End: 2022-01-18

## 2022-01-18 MED ORDER — DIAZEPAM 5 MG/ML IJ SOLN: 5.0000 mg | INTRAMUSCULAR | Status: DC | PRN

## 2022-01-18 MED ORDER — POTASSIUM CHLORIDE 10 MEQ/50ML IV SOLN
10.0000 meq | INTRAVENOUS | Status: AC
  Administered 2022-01-18: 10 meq via INTRAVENOUS
  Filled 2022-01-18 (×2): qty 50

## 2022-01-18 MED ORDER — FENTANYL CITRATE PF 50 MCG/ML IJ SOSY
50.0000 ug | PREFILLED_SYRINGE | Freq: Once | INTRAMUSCULAR | Status: AC
  Administered 2022-01-18: 50 ug via INTRAVENOUS
  Filled 2022-01-18: qty 1

## 2022-01-18 MED ORDER — POTASSIUM CHLORIDE 10 MEQ/50ML IV SOLN
10.0000 meq | Freq: Once | INTRAVENOUS | Status: AC
  Administered 2022-01-18: 10 meq via INTRAVENOUS
  Filled 2022-01-18: qty 50

## 2022-01-18 MED ORDER — MAGNESIUM HYDROXIDE 400 MG/5ML PO SUSP
960.0000 mL | Freq: Two times a day (BID) | ORAL | Status: DC
  Administered 2022-01-18 – 2022-01-22 (×7): 960 mL via RECTAL
  Filled 2022-01-18 (×21): qty 473

## 2022-01-18 MED ORDER — TRAVASOL 10 % IV SOLN
INTRAVENOUS | Status: DC
  Filled 2022-01-18: qty 420

## 2022-01-18 MED ORDER — TRAVASOL 10 % IV SOLN
INTRAVENOUS | Status: AC
  Filled 2022-01-18: qty 420

## 2022-01-18 NOTE — Progress Notes (Signed)
eLink Physician-Brief Progress Note Patient Name: [name] DOB: Aug 24, 1965 MRN: [id number]   Date of Service  01/18/2022  HPI/Events of Note  56 y/o F, here with Acute toxic metabolic encephalopathy,  intentional overdose , Hx seizure , had MRI brain 7/27 no acute abnormalities . Remains intubated due to COPD, aspiration, Ileus recently contributing to re-intubation.   E-Link called for agitation on vent. Has been on Max Precedex but Phenobarb has been being weaned off. Does have hx of ETOH + cocaine abuse.    7/29- AM- Hb 6.2 / Hypoglycemic to 58, corrected   eICU Interventions  -Keppra '500mg'$  BID  -RASS 0 to -1  -- precedex  -delirium precautions  -hx of Schizoaffective disorder -- Bipolar type+ Polysubstance use disorder  -cont phenobarb taper -cont Precedex  -Valium IV prn for longer action  -Type cross ordered- no active bleeding- Hb has been sliding down from 7.5- 6.5-> 6.2 so this is not an acute drop. Will order 1 unit         [name] N [name] 01/18/2022, 6:21 AM

## 2022-01-18 NOTE — Progress Notes (Signed)
[facility] ADULT ICU REPLACEMENT PROTOCOL   The patient does apply for the [facility] Adult ICU Electrolyte Replacment Protocol based on the criteria listed below:   1.Exclusion criteria: TCTS patients, ECMO patients, and Dialysis patients 2. Is GFR >/= 30 ml/min? Yes.    Patient's GFR today is >60 3. Is SCr </= 2? Yes.   Patient's SCr is 0.3 mg/dL 4. Did SCr increase >/= 0.5 in 24 hours? No. 5.Pt's weight >40kg  Yes.   6. Abnormal electrolyte(s): K 3.2  7. Electrolytes replaced per protocol 8.  Call MD STAT for K+ </= 2.5, Phos </= 1, or Mag </= 1 Physician:    Ronda Fairly A 01/18/2022 6:24 AM

## 2022-01-18 NOTE — Plan of Care (Signed)
  Problem: Health Behavior/Discharge Planning: Goal: Ability to identify and utilize available resources and services will improve Outcome: Progressing Goal: Ability to manage health-related needs will improve Outcome: Progressing   Problem: Fluid Volume: Goal: Ability to maintain a balanced intake and output will improve Outcome: Progressing   Problem: Clinical Measurements: Goal: Ability to maintain clinical measurements within normal limits will improve Outcome: Progressing Goal: Will remain free from infection Outcome: Progressing Goal: Diagnostic test results will improve Outcome: Progressing Goal: Respiratory complications will improve Outcome: Progressing Goal: Cardiovascular complication will be avoided Outcome: Progressing

## 2022-01-18 NOTE — Progress Notes (Signed)
PHARMACY - TOTAL PARENTERAL NUTRITION CONSULT NOTE  Indication: Prolonged ileus  Patient Measurements: Height: 5' (152.4 cm) Weight: 43.9 kg (96 lb 12.5 oz) IBW/kg (Calculated) : 45.5 TPN AdjBW (KG): 42.2 Body mass index is 18.9 kg/m. Usual weight:  240 lbs in 2017, now 88 lbs with significant weight loss over the past 3 yrs  Assessment:  16 YOF with history of polysubstance abuse and bariatric surgery presented on 7/23 s/p intentional overdose.  CT and abd XR showed large amount of stool in the colon and an ileus. She has severe PCM with minimal nutrition (TF from 7/23 until 7/25), so Pharmacy consulted to manage TPN for prolonged ileus.  Glucose / Insulin: hx DM - hypoglycemic x1 this AM, otherwise < 180 Used 2 units SSI in the past 24 hrs Electrolytes: K 3.2 post 75mq (goal >/= 4), high CL, others WNL (Na high normal, Phos low normal) Renal: SCr < 1, BUN WNL Hepatic: LFTs / tbili / TG WNL, albumin 1.7 Intake / Output; MIVF: UOP 0.7 ml/kg/hr, NG 3054m BM x 3 on 7/28  Vitamin panel: low Vit D, A and C.  Vitamins B1/B12, copper, zinc and folate WNL. GI Imaging: none since TPN initiation  GI Surgeries / Procedures: none since TPN initiation  Central access: PICC placed 01/17/22 TPN start date: 01/18/22  Nutritional Goals: RD Estimated Needs Total Energy Estimated Needs: 1600-1800 kcals Total Protein Estimated Needs: 80-90 g Total Fluid Estimated Needs: >/= 1.6 L  Current Nutrition:  NPO  Plan:  Start TPN at 35 mL/hr at 1800 (goal rate 70 ml/hr) to provide 42g AA, 118g CHO and 24g ILE for a total of 812 kCal, meeting ~50% of needs Electrolytes in TPN: Na 52m42mL for today, K 652m32m, Ca 5mEq46m Mg 5mEq/92mPhos 15mmol79mCl:Ac 1:2 Add standard MVI and trace elements to TPN (D/C per tube MVI BID) Continue sensitive SSI Q4H KCL x 4 runs ordered.  D/C PT KCL and give KPhos 15mmol 63m 1, KCL x 2 more runs. F/U AM labs CCM considering GI consult for decompressive flex sig pending  progress  F/u thiamine LOT given normal level prior to high dose supplementation F/u PO Vit A, D and C supplementation when taking orals  Anikin Prosser D. Heavan Francom, PhMina Marble, BCPS, BCCCP 7/[place]23, 8:30 AM

## 2022-01-18 NOTE — Progress Notes (Signed)
[facility] PCCM  Patient nearly self extubated ETT successfully repositioned by RT: now with good waveform on vent, oxygenation stable, bilateral breath sounds by my ausculation Will check CXR to ensure adequate placement  Roselie Awkward, MD [facility] PCCM Pager: [phone] Cell: [phone] After 7:00 pm call [place]  [phone]

## 2022-01-18 NOTE — Progress Notes (Signed)
RT called to patient's room due to low MVe alarm.  RT found patient's ETT pulled out partially to 16 cm, VTe 0.2, sats 100%, patient not in distress.  RT re-advanced ETT to 22 cm and re-secured it.  Patient has bilateral BS, good VTe and MVe.  CCM notified, came to bedside, and ordered a follow-up CXR.  RT will continue to monitor.

## 2022-01-18 NOTE — Progress Notes (Signed)
NAME:  [name], MRN:  [id number], DOB:  1966/06/07, LOS: 6 ADMISSION DATE:  01/12/2022, CONSULTATION DATE:  01/12/21 REFERRING MD:  Sedonia Small - EM, CHIEF COMPLAINT:  Intentional Overdose    History of Present Illness:  56 yo F PMH schizoaffective disorder (bipolar type), Depression, prior SI, polysubstance use disorder, COPD, HTN, DM presented to the ED following suicide attempt. Patient had been drinking, while with friends stated that she was going to kill herself and was witnessed ingesting a bottle of promethazine.   In transport with EMS became more somnolent. Arrived to ED with GCS 3 and was intubated in this setting. EM discussed case with poison control who recommended supportive care  PCCM consulted for admission in this setting.  Pertinent Medical History:  Schizoaffective disorder, bipolar type Depression Polysubstance abuse  COPD HTN DM    Significant Hospital Events: Including procedures, antibiotic start and stop dates in addition to other pertinent events   7/23 ED after etoh consumption and intentional promethazine overdose. Intubated. Admit to ICU  7/24 Tachycardic to 130s overnight, also with episode of whole body stiffening, ?dystonia vs. seizure. EEG with irritability, LTM initiated. Neuro consulted. 7/25 Extubated, intermittently somnolent/agitated, not managing secretions well. O2 sats stable on [place]. Cortrak difficult to flush. 7/26 Reintubated early AM for hypoxia, airway protection. Unasyn initiated for ?aspiration PNA. LTM EEG discontinued. MRI pending. 7/27 MRI no acute changes. Wakes up follows commands on .7 dex  7/28 off NE   Interim History / Subjective:  Remains constipated on vent. +flatus and smear  Objective:  Blood pressure (!) 82/56, pulse 79, temperature 98.7 F (37.1 C), temperature source Axillary, resp. rate (!) 21, height 5' (1.524 m), weight 43.9 kg, last menstrual period 01/08/2011, SpO2 99 %.    Vent Mode: PRVC FiO2 (%):  [40 %] 40  % Set Rate:  [18 bmp] 18 bmp Vt Set:  [360 mL] 360 mL PEEP:  [5 cmH20] 5 cmH20 Pressure Support:  [15 cmH20] 15 cmH20 Plateau Pressure:  [18 cmH20-19 cmH20] 19 cmH20   Intake/Output Summary (Last 24 hours) at 01/18/2022 0749 Last data filed at 01/18/2022 0500 Gross per 24 hour  Intake 1282.89 ml  Output 940 ml  Net 342.89 ml    Filed Weights   01/16/22 0234 01/17/22 0312 01/18/22 0432  Weight: 42.8 kg 43 kg 43.9 kg   Physical Examination: Chronically ill appearing woman +muscle wasting Abdomen distended, tympanic to percussion, TTP, +BS Lungs diminished at bases, triggers vent Follows commands x 4  H/H low again, getting more blood  Resolved Hospital Problem List:     Assessment & Plan:    Acute toxic metabolic encephalopathy, initially in setting of intentional overdose  Hx seizure  -MRI brain 7/27 no acute abnormalities  P -Keppra '500mg'$  BID  -RASS 0 to -1  -- precedex  -delirium precautions   SI with intentional promethazine overdose Schizoaffective disorder -- Bipolar type MDD  Polysubstance use disorder  P -cont phenobarb taper -psych as medically appropriate; will need IVC post-extubation   Acute respiratory failure with hypoxia initially in setting of overdose, then 2/2 aspiration  Aspiration Event Hx COPD without acute exacerbation  Tobacco use disorder  - Extubated 7/25AM, unable to manage secretions throughout the day due to varying agitation/somnolence and inability to follow commands/redirect, reintubated 7/26 - -think her ileus contributed to reintubation P -WUA/SBT  -cont unasyn -BD -pulm hygiene  -PRN CXR -ileus as below: would not extubate until this is improved  Hypotension, suspect sedation related  --  improving P -levophed PRN -MAP goal > 65   Ileus - ongoing issue, has gotten relistor, subQ neostigmine, numerous agents from above and below P -will start BID smog enemas - GI consult Monday for consideration for decompressive flex  sig if still cannot make progress -Start TPN  Hx HTN  On dilt for this at home P -holding home meds   Hypokalemia P -TPN will fix  Iron deficiency anemia Notable hx of allergic rxn to iron transfusion previously.  Required 2 units this admit.  No s/s of bleeding. P -cont Fe sup -hold heparin ppx for now, getting another unit of blood today  DM2  - SSI  Severe protein calorie malnutrition  Hx gastric bypass  History of gastric bypass ~2017, at that time weight 240lb. Weight down to 88lb, most significant weight loss over the last 3 years. P -TPN, watch for refeeding, high risk  Best Practice (right click and "Reselect all SmartList Selections" daily)   Diet/type: TPN DVT prophylaxis: SCD GI prophylaxis: PPI Lines: N/A Foley:  N/A Code Status:  full code Last date of multidisciplinary goals of care discussion [7/28 full scope]  31 min cc time Erskine Emery MD PCCM

## 2022-01-19 ENCOUNTER — Inpatient Hospital Stay (HOSPITAL_COMMUNITY): Payer: Medicare Other

## 2022-01-19 DIAGNOSIS — T50902D Poisoning by unspecified drugs, medicaments and biological substances, intentional self-harm, subsequent encounter: Secondary | ICD-10-CM | POA: Diagnosis not present

## 2022-01-19 DIAGNOSIS — R4182 Altered mental status, unspecified: Secondary | ICD-10-CM | POA: Diagnosis not present

## 2022-01-19 LAB — BASIC METABOLIC PANEL
Anion gap: 4 — ABNORMAL LOW (ref 5–15)
BUN: 6 mg/dL (ref 6–20)
CO2: 25 mmol/L (ref 22–32)
Calcium: 7.7 mg/dL — ABNORMAL LOW (ref 8.9–10.3)
Chloride: 114 mmol/L — ABNORMAL HIGH (ref 98–111)
Creatinine, Ser: 0.44 mg/dL (ref 0.44–1.00)
GFR, Estimated: >60 mL/min (ref >60)
Glucose, Bld: 135 mg/dL — ABNORMAL HIGH (ref 70–99)
Potassium: 3.7 mmol/L (ref 3.5–5.1)
Sodium: 143 mmol/L (ref 135–145)

## 2022-01-19 LAB — GLUCOSE, CAPILLARY
Glucose-Capillary: 122 mg/dL — ABNORMAL HIGH (ref 70–99)
Glucose-Capillary: 136 mg/dL — ABNORMAL HIGH (ref 70–99)
Glucose-Capillary: 144 mg/dL — ABNORMAL HIGH (ref 70–99)
Glucose-Capillary: 152 mg/dL — ABNORMAL HIGH (ref 70–99)
Glucose-Capillary: 158 mg/dL — ABNORMAL HIGH (ref 70–99)
Glucose-Capillary: 203 mg/dL — ABNORMAL HIGH (ref 70–99)
Glucose-Capillary: 217 mg/dL — ABNORMAL HIGH (ref 70–99)

## 2022-01-19 LAB — CBC
HCT: 25.8 % — ABNORMAL LOW (ref 36.0–46.0)
Hemoglobin: 7.7 g/dL — ABNORMAL LOW (ref 12.0–15.0)
MCH: 20.5 pg — ABNORMAL LOW (ref 26.0–34.0)
MCHC: 29.8 g/dL — ABNORMAL LOW (ref 30.0–36.0)
MCV: 68.6 fL — ABNORMAL LOW (ref 80.0–100.0)
Platelets: 266 10*3/uL (ref 150–400)
RBC: 3.76 MIL/uL — ABNORMAL LOW (ref 3.87–5.11)
RDW: 24.8 % — ABNORMAL HIGH (ref 11.5–15.5)
WBC: 8.1 10*3/uL (ref 4.0–10.5)
nRBC: 0 % (ref 0.0–0.2)

## 2022-01-19 LAB — PHOSPHORUS: Phosphorus: 3 mg/dL (ref 2.5–4.6)

## 2022-01-19 LAB — MAGNESIUM: Magnesium: 2.3 mg/dL (ref 1.7–2.4)

## 2022-01-19 MED ORDER — TRAVASOL 10 % IV SOLN
INTRAVENOUS | Status: AC
  Filled 2022-01-19: qty 856.8

## 2022-01-19 MED ORDER — POTASSIUM CHLORIDE 10 MEQ/50ML IV SOLN
10.0000 meq | INTRAVENOUS | Status: AC
  Administered 2022-01-19 (×4): 10 meq via INTRAVENOUS
  Filled 2022-01-19 (×4): qty 50

## 2022-01-19 MED ORDER — FUROSEMIDE 10 MG/ML IJ SOLN
40.0000 mg | Freq: Once | INTRAMUSCULAR | Status: AC
  Administered 2022-01-19: 40 mg via INTRAVENOUS
  Filled 2022-01-19: qty 4

## 2022-01-19 MED ORDER — ROCURONIUM BROMIDE 10 MG/ML (PF) SYRINGE
100.0000 mg | PREFILLED_SYRINGE | Freq: Once | INTRAVENOUS | Status: AC
Start: 2022-01-19 — End: 2022-01-19
  Administered 2022-01-19: 100 mg via INTRAVENOUS
  Filled 2022-01-19: qty 10

## 2022-01-19 MED ORDER — FENTANYL CITRATE PF 50 MCG/ML IJ SOSY
200.0000 ug | PREFILLED_SYRINGE | Freq: Once | INTRAMUSCULAR | Status: AC
  Administered 2022-01-19: 150 ug via INTRAVENOUS
  Filled 2022-01-19: qty 4

## 2022-01-19 MED ORDER — MIDAZOLAM HCL 2 MG/2ML IJ SOLN
5.0000 mg | Freq: Once | INTRAMUSCULAR | Status: AC
Start: 2022-01-19 — End: 2022-01-19
  Administered 2022-01-19: 2 mg via INTRAVENOUS
  Filled 2022-01-19: qty 6

## 2022-01-19 MED ORDER — ETOMIDATE 2 MG/ML IV SOLN
20.0000 mg | Freq: Once | INTRAVENOUS | Status: AC
  Administered 2022-01-19: 20 mg via INTRAVENOUS
  Filled 2022-01-19: qty 10

## 2022-01-19 NOTE — Progress Notes (Signed)
EEG complete - results pending 

## 2022-01-19 NOTE — Procedures (Signed)
Intubation Procedure Note  ZARAYAH LANTING  [id number]  1966/02/21  Date:01/19/22  Time:12:14 PM   Provider Performing:RT Otho Ket under supervision of Candee Furbish  MD   Procedure: Intubation (88828)  Indication(s) Respiratory Failure  Consent Risks of the procedure as well as the alternatives and risks of each were explained to the patient and/or caregiver.  Consent for the procedure was obtained and is signed in the bedside chart   Anesthesia Etomidate, Versed, Fentanyl, and Rocuronium   Time Out Verified patient identification, verified procedure, site/side was marked, verified correct patient position, special equipment/implants available, medications/allergies/relevant history reviewed, required imaging and test results available.   Sterile Technique Usual hand hygeine, masks, and gloves were used   Procedure Description Patient positioned in bed supine.  Sedation given as noted above.  Patient was intubated with endotracheal tube using Glidescope.  View was Grade 1 full glottis .  Number of attempts was 1.  Colorimetric CO2 detector was consistent with tracheal placement.   Complications/Tolerance None; patient tolerated the procedure well. Chest X-ray is ordered to verify placement.   EBL Minimal   Specimen(s) None

## 2022-01-19 NOTE — Procedures (Signed)
Percutaneous Tracheostomy Procedure Note   [name]  [id number]  [birth year]  Date:01/19/22  Time:12:15 PM   Provider Performing:Kena Limon C Tamala Julian  Procedure: Percutaneous Tracheostomy with Bronchoscopic Guidance (26203)  Indication(s) Persistent resp failure  Consent Risks of the procedure as well as the alternatives and risks of each were explained to the patient and/or caregiver.  Consent for the procedure was obtained.  Anesthesia Etomidate, Versed, Fentanyl, Vecuronium   Time Out Verified patient identification, verified procedure, site/side was marked, verified correct patient position, special equipment/implants available, medications/allergies/relevant history reviewed, required imaging and test results available.   Sterile Technique Maximal sterile technique including sterile barrier drape, hand hygiene, sterile gown, sterile gloves, mask, hair covering.    Procedure Description Appropriate anatomy identified by palpation.  Patient's neck prepped and draped in sterile fashion.  1% lidocaine with epinephrine was used to anesthetize skin overlying neck.  1.5cm incision made and blunt dissection performed until tracheal rings could be easily palpated.   Then a size 6.5 Shiley tracheostomy was placed under bronchoscopic visualization using usual Seldinger technique and serial dilation.   Bronchoscope confirmed placement above the carina.  Tracheostomy was sutured in place with adhesive pad to protect skin under pressure.    Patient connected to ventilator.   Complications/Tolerance None; patient tolerated the procedure well. Chest X-ray is ordered to confirm no post-procedural complication.   EBL Minimal   Specimen(s) None

## 2022-01-19 NOTE — Procedures (Signed)
Diagnostic Bronchoscopy  [name]  [id number]  [birth year]  Date:01/19/22  Time:12:11 PM   Provider Performing:Oryon Gary B Roosevelt Eimers   Procedure: Diagnostic Bronchoscopy (31121)  Indication(s) Assist with direct visualization of tracheostomy placement  Consent Risks of the procedure as well as the alternatives and risks of each were explained to the patient and/or caregiver.  Consent for the procedure was obtained.   Anesthesia See separate tracheostomy note   Time Out Verified patient identification, verified procedure, site/side was marked, verified correct patient position, special equipment/implants available, medications/allergies/relevant history reviewed, required imaging and test results available.   Sterile Technique Usual hand hygiene, masks, gowns, and gloves were used   Procedure Description Bronchoscope advanced through endotracheal tube and into airway.  After suctioning out tracheal secretions, bronchoscope used to provide direct visualization of tracheostomy placement.   Complications/Tolerance None; patient tolerated the procedure well.   EBL None  Specimen(s) None

## 2022-01-19 NOTE — Progress Notes (Signed)
NAME:  [name], MRN:  [id number], DOB:  [birth year], LOS: 7 ADMISSION DATE:  01/12/2022, CONSULTATION DATE:  01/12/21 REFERRING MD:  Sedonia Small - EM, CHIEF COMPLAINT:  Intentional Overdose    History of Present Illness:  56 yo F PMH schizoaffective disorder (bipolar type), Depression, prior SI, polysubstance use disorder, COPD, HTN, DM presented to the ED following suicide attempt. Patient had been drinking, while with friends stated that she was going to kill herself and was witnessed ingesting a bottle of promethazine.   In transport with EMS became more somnolent. Arrived to ED with GCS 3 and was intubated in this setting. EM discussed case with poison control who recommended supportive care  PCCM consulted for admission in this setting.  Pertinent Medical History:  Schizoaffective disorder, bipolar type Depression Polysubstance abuse  COPD HTN DM    Significant Hospital Events: Including procedures, antibiotic start and stop dates in addition to other pertinent events   7/23 ED after etoh consumption and intentional promethazine overdose. Intubated. Admit to ICU  7/24 Tachycardic to 130s overnight, also with episode of whole body stiffening, ?dystonia vs. seizure. EEG with irritability, LTM initiated. Neuro consulted. 7/25 Extubated, intermittently somnolent/agitated, not managing secretions well. O2 sats stable on [place]. Cortrak difficult to flush. 7/26 Reintubated early AM for hypoxia, airway protection. Unasyn initiated for ?aspiration PNA. LTM EEG discontinued. MRI pending. 7/27 MRI no acute changes. Wakes up follows commands on .7 dex  7/28 off NE   Interim History / Subjective:  Stool x 2 documented, trying to track down nurse to determine if large/small etc.  She wants tube out.  Objective:  Blood pressure 110/65, pulse 93, temperature 98.3 F (36.8 C), temperature source Oral, resp. rate (!) 24, height 5' (1.524 m), weight 44.9 kg, last menstrual period 01/08/2011, SpO2  96 %.    Vent Mode: PSV;CPAP FiO2 (%):  [40 %] 40 % Set Rate:  [18 bmp] 18 bmp Vt Set:  [360 mL] 360 mL PEEP:  [5 cmH20] 5 cmH20 Pressure Support:  [10 cmH20-15 cmH20] 10 cmH20 Plateau Pressure:  [16 cmH20-20 cmH20] 16 cmH20   Intake/Output Summary (Last 24 hours) at 01/19/2022 0841 Last data filed at 01/19/2022 0600 Gross per 24 hour  Intake 2014.26 ml  Output 745 ml  Net 1269.26 ml    Filed Weights   01/17/22 0312 01/18/22 0432 01/19/22 0400  Weight: 43 kg 43.9 kg 44.9 kg   Physical Examination: Chronically ill appearing woman +muscle wasting Abdomen less distended by still TTP Lungs diminished at bases, triggers vent Follows commands x 4  H/H okay after blood yesterday  Resolved Hospital Problem List:     Assessment & Plan:    Acute toxic metabolic encephalopathy, initially in setting of intentional overdose  Hx seizure  -MRI brain 7/27 no acute abnormalities  P -Keppra '500mg'$  BID  -RASS 0 to -1  -- precedex  -delirium precautions   SI with intentional promethazine overdose Schizoaffective disorder -- Bipolar type MDD  Polysubstance use disorder  P -cont phenobarb taper -psych as medically appropriate; will need IVC post-extubation   Acute respiratory failure with hypoxia initially in setting of overdose, then 2/2 aspiration - Extubated 7/25AM, unable to manage secretions throughout the day due to varying agitation/somnolence and inability to follow commands/redirect, reintubated 7/26 - -think her ileus contributed to reintubation Aspiration Event Hx COPD without acute exacerbation  Tobacco use disorder  P - Course of unasyn - Lasix x 1 - Extubation trial today - If fails may need  to wait out ileus longer  Hypotension, suspect sedation related  -- improving P -levophed PRN -MAP goal > 65   Ileus - ongoing issue, has gotten relistor, subQ neostigmine, numerous agents from above and below P - Continue BID smog enemas - Consider GI consult tomorrow  for consideration for decompressive flex sig if still cannot make progress - Continue TPN  Hx HTN  On dilt for this at home P -holding home meds   Hypokalemia P -TPN will fix  Iron deficiency anemia Notable hx of allergic rxn to iron transfusion previously.  Required 2 units this admit.  No s/s of bleeding. P -cont Fe sup -hold heparin ppx for now, last transfusion 7/29  DM2  - SSI  Severe protein calorie malnutrition  Hx gastric bypass  History of gastric bypass ~2017, at that time weight 240lb. Weight down to 88lb, most significant weight loss over the last 3 years. P -TPN, watch for refeeding, high risk  Best Practice (right click and "Reselect all SmartList Selections" daily)   Diet/type: TPN DVT prophylaxis: SCD GI prophylaxis: PPI Lines: N/A Foley:  N/A Code Status:  full code Last date of multidisciplinary goals of care discussion [7/28 full scope]  33 min cc time Erskine Emery MD PCCM

## 2022-01-19 NOTE — Procedures (Signed)
Extubation Procedure Note  Patient Details:   Name: [name] DOB: 03/22/1966 MRN: [id number]   Airway Documentation:    Vent end date: 01/19/22 Vent end time: 0838   Evaluation  O2 sats: stable throughout Complications: No apparent complications Patient did tolerate procedure well. Bilateral Breath Sounds: Clear, Diminished   Yes  Positive cuff leak noted. Pt placed on [place] 4 L with humidity, no stridor noted. Patient not following commands to use the incentive spirometer at this time.  Bayard Beaver 01/19/2022, 8:46 AM

## 2022-01-19 NOTE — Procedures (Signed)
Patient Name: ZONIA CAPLIN  MRN: [id number]  Epilepsy Attending: Lora Havens  Referring Physician/Provider: Greta Doom, MD  Date: 7/302023 Duration: 23.17 mins   Patient history: 56 year old female with altered mental status.  EEG to evaluate for seizure.   Level of alertness: awake   AEDs during EEG study: LEV, Phenobarb   Technical aspects: This EEG study was done with scalp electrodes positioned according to the 10-20 International system of electrode placement. Electrical activity was acquired at a sampling rate of '500Hz'$  and reviewed with a high frequency filter of '70Hz'$  and a low frequency filter of '1Hz'$ . EEG data were recorded continuously and digitally stored.    Description: No posterior dominant rhythm was seen. EEG showed continuous generalized 3 to 6 Hz  theta-delta slowing admixed with 12 to 14 Hz beta activity. Hyperventilation and photic stimulation were not performed.     Of note, study was technically difficult due to significant movement and electrode artifact.    ABNORMALITY - Continuous slow, generalized   IMPRESSION: This technically difficult study is suggestive of moderate severe diffuse encephalopathy, nonspecific etiology. No seizures were seen throughout the recording.  Please consider repeating study if concern for ictal-iterictal activity persists.     Sherry Rogus Barbra Sarks

## 2022-01-19 NOTE — Progress Notes (Signed)
PHARMACY - TOTAL PARENTERAL NUTRITION CONSULT NOTE  Indication: Prolonged ileus  Patient Measurements: Height: 5' (152.4 cm) Weight: 44.9 kg (98 lb 15.8 oz) IBW/kg (Calculated) : 45.5 TPN AdjBW (KG): 42.2 Body mass index is 19.33 kg/m. Usual weight:  240 lbs in 2017, now 88 lbs with significant weight loss over the past 3 yrs  Assessment:  12 YOF with history of polysubstance abuse and bariatric surgery presented on 7/23 s/p intentional overdose.  CT and abd XR showed large amount of stool in the colon and an ileus. She has severe PCM with minimal nutrition since admit (TF from 7/23 until 7/25), so Pharmacy consulted to manage TPN.  Glucose / Insulin: hx DM on ?metformin PTA - hypoglycemic x2 on 7/29 prior to TPN initiation, otherwise < 180.  Used 7 units SSI in the past 24 hrs. Electrolytes: K 3.7 post KCL x 6 runs and KPhos 15 mmol (goal K >/= 4), others WNL Renal: SCr < 1, BUN WNL Hepatic: LFTs / tbili / TG WNL, albumin 1.7 Intake / Output; MIVF: UOP 0.7 ml/kg/hr, NG 61m, BM x 2 on 7/29 Vitamin/Mineral panel: low Vit D/A/C.  Vitamins B1/B12, copper, zinc and folate WNL. GI Imaging: none since TPN initiation  GI Surgeries / Procedures: none since TPN initiation  Central access: PICC placed 01/17/22 TPN start date: 01/18/22  Nutritional Goals: RD Estimated Needs Total Energy Estimated Needs: 1600-1800 kcals Total Protein Estimated Needs: 80-90 g Total Fluid Estimated Needs: >/= 1.6 L  Current Nutrition:  TPN  Plan:  Increase TPN to goal rate of 70 ml/hr at 1800 to provide 86g AA, 244g CHO and 44g ILE for a total of 1607 kCal, meeting 100% of needs Electrolytes in TPN: Na 058m/L for today, K 4596mL, Ca 5mE39m, Mg 2mEq36m Phos 12mmo69m max acetate for now Add standard MVI and trace elements to TPN (D/C per tube MVI BID) Continue sensitive SSI Q4H KCL x 4 runs Standard TPN labs on Mon and Thurs  CCM considering GI consult for decompressive flex sig pending progress F/u PO  Vit A, D and C supplementation when taking orals  [name] D. Chole Driver, [name], BCPS, BCCCP [name]2023, 7:22 AM

## 2022-01-19 NOTE — Progress Notes (Signed)
Unable to handle secretions and gets more lethargic after extubation.  Think this is more deconditioning as ileus is improved.  Spoke with daughter at length, will proceed with tracheostomy to facilitate ventilator weaning.  Erskine Emery MD PCCM

## 2022-01-19 NOTE — Progress Notes (Signed)
Patient is now awake, alert, following commands readily.  At this point, I do not know that she needs to be on long-term Keppra, especially given that she has not had any definite seizures.  I will repeat an EEG today, and if it is negative discontinue Keppra.  Roland Rack, MD [facility] [phone]  If 7pm- 7am, please page neurology on call as listed in [place].

## 2022-01-20 ENCOUNTER — Inpatient Hospital Stay (HOSPITAL_COMMUNITY): Payer: Medicare Other

## 2022-01-20 DIAGNOSIS — E43 Unspecified severe protein-calorie malnutrition: Secondary | ICD-10-CM

## 2022-01-20 DIAGNOSIS — J9601 Acute respiratory failure with hypoxia: Secondary | ICD-10-CM

## 2022-01-20 DIAGNOSIS — G929 Unspecified toxic encephalopathy: Secondary | ICD-10-CM | POA: Diagnosis not present

## 2022-01-20 DIAGNOSIS — T50902D Poisoning by unspecified drugs, medicaments and biological substances, intentional self-harm, subsequent encounter: Secondary | ICD-10-CM | POA: Diagnosis not present

## 2022-01-20 LAB — COMPREHENSIVE METABOLIC PANEL
ALT: 17 U/L (ref 0–44)
ALT: 17 U/L (ref 0–44)
ALT: 21 U/L (ref 0–44)
AST: 19 U/L (ref 15–41)
AST: 31 U/L (ref 15–41)
AST: 35 U/L (ref 15–41)
Albumin: 1.6 g/dL — ABNORMAL LOW (ref 3.5–5.0)
Albumin: 1.6 g/dL — ABNORMAL LOW (ref 3.5–5.0)
Albumin: 1.6 g/dL — ABNORMAL LOW (ref 3.5–5.0)
Alkaline Phosphatase: 42 U/L (ref 38–126)
Alkaline Phosphatase: 44 U/L (ref 38–126)
Alkaline Phosphatase: 49 U/L (ref 38–126)
Anion gap: 5 (ref 5–15)
Anion gap: 5 (ref 5–15)
Anion gap: 4 — ABNORMAL LOW (ref 5–15)
BUN: 9 mg/dL (ref 6–20)
BUN: 10 mg/dL (ref 6–20)
BUN: 9 mg/dL (ref 6–20)
CO2: 27 mmol/L (ref 22–32)
CO2: 24 mmol/L (ref 22–32)
CO2: 26 mmol/L (ref 22–32)
Calcium: 7.8 mg/dL — ABNORMAL LOW (ref 8.9–10.3)
Calcium: 7.7 mg/dL — ABNORMAL LOW (ref 8.9–10.3)
Calcium: 7.7 mg/dL — ABNORMAL LOW (ref 8.9–10.3)
Chloride: 108 mmol/L (ref 98–111)
Chloride: 102 mmol/L (ref 98–111)
Chloride: 108 mmol/L (ref 98–111)
Creatinine, Ser: 0.37 mg/dL — ABNORMAL LOW (ref 0.44–1.00)
Creatinine, Ser: 0.32 mg/dL — ABNORMAL LOW (ref 0.44–1.00)
Creatinine, Ser: 0.36 mg/dL — ABNORMAL LOW (ref 0.44–1.00)
GFR, Estimated: >60 mL/min (ref >60)
GFR, Estimated: >60 mL/min (ref >60)
GFR, Estimated: >60 mL/min (ref >60)
Glucose, Bld: 134 mg/dL — ABNORMAL HIGH (ref 70–99)
Glucose, Bld: 563 mg/dL (ref 70–99)
Glucose, Bld: 225 mg/dL — ABNORMAL HIGH (ref 70–99)
Potassium: 4.2 mmol/L (ref 3.5–5.1)
Potassium: 6.1 mmol/L — ABNORMAL HIGH (ref 3.5–5.1)
Potassium: 3.8 mmol/L (ref 3.5–5.1)
Sodium: 140 mmol/L (ref 135–145)
Sodium: 131 mmol/L — ABNORMAL LOW (ref 135–145)
Sodium: 138 mmol/L (ref 135–145)
Total Bilirubin: 0.2 mg/dL — ABNORMAL LOW (ref 0.3–1.2)
Total Bilirubin: <0.1 mg/dL — ABNORMAL LOW (ref 0.3–1.2)
Total Bilirubin: 0.6 mg/dL (ref 0.3–1.2)
Total Protein: 6.3 g/dL — ABNORMAL LOW (ref 6.5–8.1)
Total Protein: 5.9 g/dL — ABNORMAL LOW (ref 6.5–8.1)
Total Protein: 6.3 g/dL — ABNORMAL LOW (ref 6.5–8.1)

## 2022-01-20 LAB — TRIGLYCERIDES: Triglycerides: 244 mg/dL — ABNORMAL HIGH (ref <150)

## 2022-01-20 LAB — MAGNESIUM: Magnesium: 2 mg/dL (ref 1.7–2.4)

## 2022-01-20 LAB — CBC
HCT: 25.6 % — ABNORMAL LOW (ref 36.0–46.0)
Hemoglobin: 7.4 g/dL — ABNORMAL LOW (ref 12.0–15.0)
MCH: 20.8 pg — ABNORMAL LOW (ref 26.0–34.0)
MCHC: 28.9 g/dL — ABNORMAL LOW (ref 30.0–36.0)
MCV: 71.9 fL — ABNORMAL LOW (ref 80.0–100.0)
Platelets: 240 10*3/uL (ref 150–400)
RBC: 3.56 MIL/uL — ABNORMAL LOW (ref 3.87–5.11)
RDW: 26.7 % — ABNORMAL HIGH (ref 11.5–15.5)
WBC: 9.7 10*3/uL (ref 4.0–10.5)
nRBC: 0 % (ref 0.0–0.2)

## 2022-01-20 LAB — GLUCOSE, CAPILLARY
Glucose-Capillary: 138 mg/dL — ABNORMAL HIGH (ref 70–99)
Glucose-Capillary: 157 mg/dL — ABNORMAL HIGH (ref 70–99)
Glucose-Capillary: 174 mg/dL — ABNORMAL HIGH (ref 70–99)
Glucose-Capillary: 182 mg/dL — ABNORMAL HIGH (ref 70–99)
Glucose-Capillary: 185 mg/dL — ABNORMAL HIGH (ref 70–99)
Glucose-Capillary: 205 mg/dL — ABNORMAL HIGH (ref 70–99)

## 2022-01-20 LAB — PHOSPHORUS: Phosphorus: 3.7 mg/dL (ref 2.5–4.6)

## 2022-01-20 MED ORDER — SIMETHICONE 80 MG PO CHEW: 160.0000 mg | CHEWABLE_TABLET | Freq: Three times a day (TID) | ORAL | Status: DC | PRN

## 2022-01-20 MED ORDER — TRAVASOL 10 % IV SOLN
INTRAVENOUS | Status: AC
  Filled 2022-01-20: qty 856.8

## 2022-01-20 MED ORDER — DIAZEPAM 5 MG/ML IJ SOLN: 1.0000 mg | Freq: Two times a day (BID) | INTRAMUSCULAR | Status: DC | PRN

## 2022-01-20 MED ORDER — SIMETHICONE 40 MG/0.6ML PO SUSP
40.0000 mg | Freq: Four times a day (QID) | ORAL | Status: DC | PRN
Start: 2022-01-20 — End: 2022-01-21

## 2022-01-20 NOTE — Progress Notes (Signed)
PHARMACY - TOTAL PARENTERAL NUTRITION CONSULT NOTE  Indication: Prolonged ileus  Patient Measurements: Height: 5' (152.4 cm) Weight: 45.5 kg (100 lb 5 oz) IBW/kg (Calculated) : 45.5 TPN AdjBW (KG): 42.2 Body mass index is 19.59 kg/m. Usual weight:  240 lbs in 2017, now 88 lbs with significant weight loss over the past 3 yrs  Assessment:  73 YOF with history of polysubstance abuse and bariatric surgery presented on 7/23 s/p intentional overdose.  CT and abd XR showed large amount of stool in the colon and an ileus. She has severe PCM with minimal nutrition since admit (TF from 7/23 until 7/25), so Pharmacy consulted to manage TPN.  Glucose / Insulin: hx DM on ?metformin PTA - hypoglycemic x2 on 7/29 prior to TPN initiation, otherwise < 180.  Used 10 units SSI in the past 24 hrs. Electrolytes: K 4.2 (goal K >/= 4), others WNL Renal: SCr < 1, BUN WNL Hepatic: LFTs / tbili / TG WNL, albumin 1.6 Intake / Output; MIVF: UOP 2.5 ml/kg/hr, BM x 2 on 7/29 Vitamin/Mineral panel: low Vit D/A/C.  Vitamins B1/B12, copper, zinc and folate WNL. GI Imaging: none since TPN initiation  GI Surgeries / Procedures: none since TPN initiation  Central access: PICC placed 01/17/22 TPN start date: 01/18/22  Nutritional Goals: RD Estimated Needs Total Energy Estimated Needs: 1600-1800 kcals Total Protein Estimated Needs: 80-90 g Total Fluid Estimated Needs: >/= 1.6 L  Current Nutrition:  TPN  Plan:  Continue TPN to goal rate of 70 ml/hr at 1800 to provide 86g AA, 244g CHO and 44g ILE for a total of 1607 kCal, meeting 100% of needs Electrolytes in TPN: Na 71mq/L, K 493m/L, Ca 66m44mL, Mg 2mE52m, Phos 12mm63m, max acetate for now Add standard MVI and trace elements to TPN  Continue sensitive SSI Q4H Standard TPN labs on Mon and Thurs  CCM considering GI consult for decompressive flex sig - f/u F/u PO Vit A, D and C supplementation when taking orals  CathyAlanda SlimrmD, FCCM [facility]  Pharmacist Please see AMION for all Pharmacists' Contact Phone Numbers 01/20/2022, 8:31 AM

## 2022-01-20 NOTE — Progress Notes (Signed)
PT Cancellation Note  Patient Details Name: [name] MRN: [id number] DOB: 21-Jul-1965   Cancelled Treatment:    Reason Eval/Treat Not Completed: Patient not medically ready (pt trached on 70% FiO2 and not currently medically appropriate for mobility)   Gussie Murton B Drayce Tawil 01/20/2022, 7:49 AM [place] Office: [phone]

## 2022-01-20 NOTE — TOC Progression Note (Signed)
Transition of Care [facility]) - Progression Note    Patient Details  Name: [name] MRN: [id number] Date of Birth: May 19, 1966  Transition of Care [facility] CM/SW [place], [place] Phone Number: 01/20/2022, 5:09 PM  Clinical Narrative:     IVC has been renewed on 7/31. CSW will continue to follow and assist with patients dc planning needs.  Expected Discharge Plan:  (inpatient psych) Barriers to Discharge: Continued Medical Work up  Expected Discharge Plan and Services Expected Discharge Plan:  (inpatient psych) In-house Referral: Clinical Social Work Discharge Planning Services: CM Consult   Living arrangements for the past 2 months: [place] Determinants of Health (SDOH) Interventions    Readmission Risk Interventions    07/27/2020   11:06 AM  Readmission Risk Prevention Plan  Transportation Screening Complete  PCP or Specialist Appt within 5-7 Days Complete  Home Care Screening Complete

## 2022-01-20 NOTE — Progress Notes (Signed)
Nutrition Follow-up  DOCUMENTATION CODES:   Underweight, Severe malnutrition in context of chronic illness  INTERVENTION:   TPN to meet nutritional needs; TPN order per Pharmacy  Await resolution of colonic ileus, then recommend trial of trickle TF  Continue IV Thiamine, B-12 injections; Receiving standard MVI and trace minerals via TPN  Add Vitamin A and D supplementation once able to take po  NUTRITION DIAGNOSIS:   Severe Malnutrition related to chronic illness (hx gastric bypass, COPD, polysubtance abuse) as evidenced by severe fat depletion, severe muscle depletion.  Being addressed via TPN  GOAL:   Patient will meet greater than or equal to 90% of their needs   Met via TPN  MONITOR:   TF tolerance, Vent status, Labs, Weight trends  REASON FOR ASSESSMENT:   Consult, Ventilator Enteral/tube feeding initiation and management  ASSESSMENT:   56 yo female admitted with acute encephalopathy in setting of intentional OD with SI, intubated for airway protection. PMH includes COPD, HTN, DM, schizoaffective disorder, depression. Pt with hx of gastric bypass in 2017, sig weight loss   7/23 Admitted, Intubated, TF started via Protocol 7/25 Extubated, TF held, Cortrak placed, kinked and could not use, re-intubated, TF not started 7/26 Cortrak TF adjusted by RD, flushed, TF remains on hold 7/27 CT abdomen: Large amount of stool in the ascending colon.Moderate amount of stool in the descending colon. Gaseous distension of the transverse colon. Mild distension of the small bowel. 7/28 TPN consul placed 7/29 TPN initiated 7/30 Trach placed  Tolerating trach collar, noted pt still with +abdominal pain  TPN at 70 ml/hr provides 86 g of protein, 1607 kcals and 1800 mL of fluid.   No enteral access currently, MD requesting Cortrak placement  Noted plan for possible GI consult Abd xray with mild improvement in colonic ileus despite documentation of several stool occurrences. Pt  with significant bowel regimen including current regimen of  dulcolax BID, colace BID, lactulose TID, SMOG enema BID  Micronutrient Panel: CRP: 9.1 (H) Copper:129 (wdl) Thiamine: 107.8 (wdl) Vitamin A: 14.7 (L) Vitamin C: 0.3 (L) Vitamin D: 10.32 (L) Vitamin B12: 193 (L-normal)-likely low once inflammation taken into account Zinc: 48 (wdl)  Labs: potassium 4.2 (wdl), phosphorus 3.7 (wdl), Meds: B-12, ss novolog   Diet Order:   Diet Order             Diet NPO time specified  Diet effective now                   EDUCATION NEEDS:   Not appropriate for education at this time  Skin:  Skin Assessment: Reviewed RN Assessment  Last BM:  7/28  Height:   Ht Readings from Last 1 Encounters:  01/12/22 5' (1.524 m)    Weight:   Wt Readings from Last 1 Encounters:  01/20/22 45.5 kg    BMI:  Body mass index is 19.59 kg/m.  Estimated Nutritional Needs:   Kcal:  1600-1800 kcals  Protein:  80-90 g  Fluid:  >/= 1.6 L    Kerman Passey MS, RDN, LDN, CNSC Registered Dietitian 3 Clinical Nutrition RD Pager and On-Call Pager Number Located in [place]

## 2022-01-20 NOTE — Progress Notes (Addendum)
NAME:  [name], MRN:  [id number], DOB:  05/17/1966, LOS: 8 ADMISSION DATE:  01/12/2022, CONSULTATION DATE:  01/12/21 REFERRING MD:  Sedonia Small - EM, CHIEF COMPLAINT:  Intentional Overdose    History of Present Illness:  56 yo F PMH schizoaffective disorder (bipolar type), Depression, prior SI, polysubstance use disorder, COPD, HTN, DM presented to the ED following suicide attempt. Patient had been drinking, while with friends stated that she was going to kill herself and was witnessed ingesting a bottle of promethazine.   In transport with EMS became more somnolent. Arrived to ED with GCS 3 and was intubated in this setting. EM discussed case with poison control who recommended supportive care  PCCM consulted for admission in this setting.  Pertinent Medical History:  Schizoaffective disorder, bipolar type Depression Polysubstance abuse  COPD HTN DM    Significant Hospital Events: Including procedures, antibiotic start and stop dates in addition to other pertinent events   7/23 ED after etoh consumption and intentional promethazine overdose. Intubated. Admit to ICU  7/24 Tachycardic to 130s overnight, also with episode of whole body stiffening, ?dystonia vs. seizure. EEG with irritability, LTM initiated. Neuro consulted. 7/25 Extubated, intermittently somnolent/agitated, not managing secretions well. O2 sats stable on [place]. Cortrak difficult to flush. 7/26 Reintubated early AM for hypoxia, airway protection. Unasyn initiated for ?aspiration PNA. LTM EEG discontinued. MRI pending. 7/27 MRI no acute changes. Wakes up follows commands on .7 dex  7/28 off NE  01/19/2022>> Trach after goals of care discussion with daughter 7/31 Transitioned to ATC.   Interim History / Subjective:  Maxed out on precedex and complaining of abdominal pain Currently on ATC and doing well, with adequate sats Feeding tube out, orders to replace it to establish route for medications    Objective:  Blood  pressure (!) 113/59, pulse 94, temperature 99.6 F (37.6 C), temperature source Oral, resp. rate (!) 28, height 5' (1.524 m), weight 45.5 kg, last menstrual period 01/08/2011, SpO2 93 %.    Vent Mode: PSV;CPAP FiO2 (%):  [40 %-70 %] 60 % Set Rate:  [18 bmp] 18 bmp Vt Set:  [360 mL] 360 mL PEEP:  [5 cmH20-8 cmH20] 5 cmH20 Pressure Support:  [10 GEX52-84 cmH20] 10 cmH20 Plateau Pressure:  [26 cmH20] 26 cmH20   Intake/Output Summary (Last 24 hours) at 01/20/2022 1225 Last data filed at 01/20/2022 1000 Gross per 24 hour  Intake 5009.05 ml  Output 760 ml  Net 4249.05 ml   Filed Weights   01/18/22 0432 01/19/22 0400 01/20/22 0500  Weight: 43.9 kg 44.9 kg 45.5 kg   Physical Examination: General: Awake and alert, talking around trach Skin:Warm and dry, brisk capillary refill HEENT:Temporal wasting, Trach is midline clean, MM pink and moist Neck:supple, no JVD, no bruits, No LAD  Heart:S1S2 RRR without murmur, gallup, rub or click Lungs:Bilateral chest excursion, clear without rales, rhonchi, or wheezes, diminished per bases XLK:GMWN, + tender, diminished  BS, no masses noted on palpation  Ext:no lower ext edema, 2+ pedal pulses, 2+ radial pulses, muscle wasting Neuro:alert and oriented X 3, MAE, follows commands, + facial symmetry , lip speaks around trach   Net + 3500 cc's >. 2760 urine output Phos 3.7/ Mag 2.0/ Na 140/ K 4.2/ Creatinine 0.37/ BUN 9/ Calcium 7.8 but corrects to 9.7 with albumin if 1.6 Total Bili 0.2 Triglycerides 244/  WBC 9.7/ HGB 7.4/platelets 240  Resolved Hospital Problem List:     Assessment & Plan:    Acute toxic metabolic encephalopathy, initially  in setting of intentional overdose  Hx seizure  Improving mentation  -MRI brain 7/27 no acute abnormalities  P -Keppra '500mg'$  BID  -RASS 0 to -1  -- precedex >> 1.2 mcg/kg/hr -delirium precautions   SI with intentional promethazine overdose Schizoaffective disorder -- Bipolar type MDD  Polysubstance use  disorder  P - phenobarb taper completed -psych as medically appropriate; will need IVC post-extubation   Acute respiratory failure with hypoxia initially in setting of overdose, then 2/2 aspiration - Extubated 7/25AM, unable to manage secretions throughout the day due to varying agitation/somnolence and inability to follow commands/redirect, reintubated 7/26 - -think her ileus contributed to reintubation Aspiration Event Hx COPD without acute exacerbation  Tobacco use disorder  Now on ATC P - Course of unasyn - Lasix x 1 on 7/30 - CXR in am and prn  - Mobilize    Hypotension, suspect sedation related  -- improving P -levophed PRN -MAP goal > 65   Ileus - ongoing issue, has gotten relistor, subQ neostigmine, numerous agents from above and below P - Continue BID smog enemas - KUB now and eval for GI consult - Consider GI consult tomorrow for consideration for decompressive flex sig if still cannot make progress - Continue TPN  Hx HTN  On dilt for this at home P -holding home meds   Hypokalemia P -TPN  - Trend BMET and replete as needed  Iron deficiency anemia Notable hx of allergic rxn to iron transfusion previously.  Required 2 units this admit.  No s/s of bleeding. P -cont Fe sup -hold heparin ppx for now, last transfusion 7/29  DM2  - SSI  Severe protein calorie malnutrition  Hx gastric bypass  History of gastric bypass ~2017, at that time weight 240lb. Weight down to 88lb, most significant weight loss over the last 3 years. P -TPN, watch for refeeding, high risk  Best Practice (right click and "Reselect all SmartList Selections" daily)   Diet/type: TPN DVT prophylaxis: SCD GI prophylaxis: PPI Lines: N/A Foley:  N/A Code Status:  full code Last date of multidisciplinary goals of care discussion [7/28 full scope]  35 minutes critical care time   Magdalen Spatz, MSN, AGACNP-BC [place] for personal  pager PCCM on call pager [phone]  01/20/2022 1:10 PM

## 2022-01-20 NOTE — Procedures (Signed)
Cortrak  Person Inserting Tube:  Maylon Peppers C, RD Tube Type:  Cortrak - 43 inches Tube Size:  10 Tube Location:  Left nare Secured by: Bridle Technique Used to Measure Tube Placement:  Marking at nare/corner of mouth Cortrak Secured At:  56 cm   Cortrak Tube Team Note:  Consult received to place a Cortrak feeding tube.   X-ray is required, abdominal x-ray has been ordered by the Cortrak team. Please confirm tube placement before using the Cortrak tube.   If the tube becomes dislodged please keep the tube and contact the Cortrak team at [email or web address] ([email or web address]) for replacement.  If after hours and replacement cannot be delayed, place a NG tube and confirm placement with an abdominal x-ray.    Lockie Pares., RD, LDN, CNSC See AMiON for contact information

## 2022-01-20 NOTE — Progress Notes (Signed)
Physical Therapy Treatment Patient Details Name: [name] MRN: [id number] DOB: 1966-06-09 Today's Date: 01/20/2022   History of Present Illness 56 y.o. female admitted 01/12/22 after suicide attempt with intentional promethazine OD. EEG without seizures; showed evidence of epileptogenicity. ETT 7/23-7/25. Reintubated 7/26. Ileus. 7/30 extubated but then trached and returned to vent. PMHx:Afib, COPD, DM, neuropathy, HTN, bipolar disorder, schizophrenia, anxiety, depression, ETOH abuse.    PT Comments    Pt awake, alert, following commands and oriented to self and place. Pt frequently trying to reach for trach stating itching and needing assist and cues not to pull. PT able to assist with rolling, sitting and standing today on 40% PS/CPAP with SpO2 94%. Pt limited by fatigue and abdominal pain with smears of BM during session and assist for linen change. Will continue to follow to progress mobility.     Recommendations for follow up therapy are one component of a multi-disciplinary discharge planning process, led by the attending physician.  Recommendations may be updated based on patient status, additional functional criteria and insurance authorization.  Follow Up Recommendations  Skilled nursing-short term rehab (<3 hours/day) Can patient physically be transported by private vehicle: No   Assistance Recommended at Discharge Frequent or constant Supervision/Assistance  Patient can return home with the following A lot of help with walking and/or transfers;A lot of help with bathing/dressing/bathroom;Assist for transportation;Help with stairs or ramp for entrance;Assistance with cooking/housework;Direct supervision/assist for medications management;Direct supervision/assist for financial management   Equipment Recommendations  Other (comment) (TBD)    Recommendations for Other Services       Precautions / Restrictions Precautions Precautions: Fall;Other (comment) Precaution  Comments: trach, vent, bil wrist restraints     Mobility  Bed Mobility Overal bed mobility: Needs Assistance Bed Mobility: Rolling, Supine to Sit, Sit to Supine Rolling: Min guard   Supine to sit: Mod assist Sit to supine: Mod assist   General bed mobility comments: physical assist to clear legs from surface and elevate trunk into sitting. Return to supine with assist to lift legs. Min assist to slide toward [facility] with pt assisting with bil rails    Transfers Overall transfer level: Needs assistance   Transfers: Sit to/from Stand Sit to Stand: Mod assist           General transfer comment: mod assist with knees blocked and bil UE support on therapist to rise from bed    Ambulation/Gait               General Gait Details: not yet able   Stairs             Wheelchair Mobility    Modified Rankin (Stroke Patients Only)       Balance Overall balance assessment: Needs assistance Sitting-balance support: No upper extremity supported, Feet supported Sitting balance-Leahy Scale: Poor Sitting balance - Comments: initial mod assist with progression to min sitting EOB     Standing balance-Leahy Scale: Poor Standing balance comment: min assist for standing balacne with left knee blocked                            Cognition Arousal/Alertness: Awake/alert Behavior During Therapy: Flat affect Overall Cognitive Status: Impaired/Different from baseline Area of Impairment: Memory, Following commands, Safety/judgement, Problem solving                     Memory: Decreased short-term memory Following Commands: Follows one step commands consistently Safety/Judgement: Decreased awareness  of deficits, Decreased awareness of safety   Problem Solving: Slow processing General Comments: pt with attempts to grab trach x 2 due to itching needing assist and redirection. Pt oriented to self, place and following single step commands        Exercises       General Comments        Pertinent Vitals/Pain Pain Assessment Pain Assessment: Faces Pain Score: 4  Pain Location: stomach Pain Descriptors / Indicators: Aching, Guarding Pain Intervention(s): Limited activity within patient's tolerance, Monitored during session, Repositioned    Home Living                          Prior Function            PT Goals (current goals can now be found in the care plan section) Progress towards PT goals: Progressing toward goals    Frequency    Min 3X/week      PT Plan Current plan remains appropriate;Frequency needs to be updated    Co-evaluation              AM-PAC PT "6 Clicks" Mobility   Outcome Measure  Help needed turning from your back to your side while in a flat bed without using bedrails?: A Little Help needed moving from lying on your back to sitting on the side of a flat bed without using bedrails?: A Lot Help needed moving to and from a bed to a chair (including a wheelchair)?: A Lot Help needed standing up from a chair using your arms (e.g., wheelchair or bedside chair)?: A Lot Help needed to walk in hospital room?: Total Help needed climbing 3-5 steps with a railing? : Total 6 Click Score: 11    End of Session   Activity Tolerance: Patient tolerated treatment well Patient left: in bed;with call bell/phone within reach;with family/visitor present;with nursing/sitter in room;with restraints reapplied Nurse Communication: Mobility status PT Visit Diagnosis: Other abnormalities of gait and mobility (R26.89);Difficulty in walking, not elsewhere classified (R26.2);Muscle weakness (generalized) (M62.81)     Time: 7616-0737 PT Time Calculation (min) (ACUTE ONLY): 19 min  Charges:  $Therapeutic Activity: 8-22 mins                     Bayard Males, PT Acute Rehabilitation Services Office: [phone]    [name] 01/20/2022, 11:50 AM

## 2022-01-21 ENCOUNTER — Inpatient Hospital Stay (HOSPITAL_COMMUNITY): Payer: Medicare Other

## 2022-01-21 DIAGNOSIS — T50902D Poisoning by unspecified drugs, medicaments and biological substances, intentional self-harm, subsequent encounter: Secondary | ICD-10-CM | POA: Diagnosis not present

## 2022-01-21 DIAGNOSIS — K567 Ileus, unspecified: Secondary | ICD-10-CM | POA: Diagnosis not present

## 2022-01-21 DIAGNOSIS — E876 Hypokalemia: Secondary | ICD-10-CM | POA: Diagnosis not present

## 2022-01-21 DIAGNOSIS — E43 Unspecified severe protein-calorie malnutrition: Secondary | ICD-10-CM | POA: Diagnosis not present

## 2022-01-21 LAB — CBC WITH DIFFERENTIAL/PLATELET
Abs Immature Granulocytes: 0.08 10*3/uL — ABNORMAL HIGH (ref 0.00–0.07)
Basophils Absolute: 0 10*3/uL (ref 0.0–0.1)
Basophils Relative: 0 %
Eosinophils Absolute: 0.1 10*3/uL (ref 0.0–0.5)
Eosinophils Relative: 0 %
HCT: 27 % — ABNORMAL LOW (ref 36.0–46.0)
Hemoglobin: 7.7 g/dL — ABNORMAL LOW (ref 12.0–15.0)
Immature Granulocytes: 1 %
Lymphocytes Relative: 8 %
Lymphs Abs: 0.9 10*3/uL (ref 0.7–4.0)
MCH: 20.1 pg — ABNORMAL LOW (ref 26.0–34.0)
MCHC: 28.5 g/dL — ABNORMAL LOW (ref 30.0–36.0)
MCV: 70.5 fL — ABNORMAL LOW (ref 80.0–100.0)
Monocytes Absolute: 0.6 10*3/uL (ref 0.1–1.0)
Monocytes Relative: 5 %
Neutro Abs: 10.5 10*3/uL — ABNORMAL HIGH (ref 1.7–7.7)
Neutrophils Relative %: 86 %
Platelets: 275 10*3/uL (ref 150–400)
RBC: 3.83 MIL/uL — ABNORMAL LOW (ref 3.87–5.11)
RDW: 26.8 % — ABNORMAL HIGH (ref 11.5–15.5)
Smear Review: NORMAL
WBC: 12.2 10*3/uL — ABNORMAL HIGH (ref 4.0–10.5)
nRBC: 0 % (ref 0.0–0.2)

## 2022-01-21 LAB — BASIC METABOLIC PANEL
Anion gap: 4 — ABNORMAL LOW (ref 5–15)
BUN: 9 mg/dL (ref 6–20)
CO2: 25 mmol/L (ref 22–32)
Calcium: 7.7 mg/dL — ABNORMAL LOW (ref 8.9–10.3)
Chloride: 108 mmol/L (ref 98–111)
Creatinine, Ser: 0.35 mg/dL — ABNORMAL LOW (ref 0.44–1.00)
GFR, Estimated: >60 mL/min (ref >60)
Glucose, Bld: 173 mg/dL — ABNORMAL HIGH (ref 70–99)
Potassium: 3.9 mmol/L (ref 3.5–5.1)
Sodium: 137 mmol/L (ref 135–145)

## 2022-01-21 LAB — MAGNESIUM: Magnesium: 1.7 mg/dL (ref 1.7–2.4)

## 2022-01-21 LAB — GLUCOSE, CAPILLARY
Glucose-Capillary: 158 mg/dL — ABNORMAL HIGH (ref 70–99)
Glucose-Capillary: 179 mg/dL — ABNORMAL HIGH (ref 70–99)
Glucose-Capillary: 197 mg/dL — ABNORMAL HIGH (ref 70–99)
Glucose-Capillary: 228 mg/dL — ABNORMAL HIGH (ref 70–99)
Glucose-Capillary: 230 mg/dL — ABNORMAL HIGH (ref 70–99)
Glucose-Capillary: 235 mg/dL — ABNORMAL HIGH (ref 70–99)

## 2022-01-21 MED ORDER — POTASSIUM CHLORIDE 10 MEQ/50ML IV SOLN
10.0000 meq | INTRAVENOUS | Status: AC
  Administered 2022-01-21 (×4): 10 meq via INTRAVENOUS
  Filled 2022-01-21 (×4): qty 50

## 2022-01-21 MED ORDER — HEPARIN SODIUM (PORCINE) 5000 UNIT/ML IJ SOLN
5000.0000 [IU] | Freq: Three times a day (TID) | INTRAMUSCULAR | Status: DC
  Administered 2022-01-21 – 2022-02-11 (×63): 5000 [IU] via SUBCUTANEOUS
  Filled 2022-01-21 (×67): qty 1

## 2022-01-21 MED ORDER — MAGNESIUM SULFATE 2 GM/50ML IV SOLN
2.0000 g | Freq: Once | INTRAVENOUS | Status: AC
  Administered 2022-01-21: 2 g via INTRAVENOUS
  Filled 2022-01-21: qty 50

## 2022-01-21 MED ORDER — TRAVASOL 10 % IV SOLN
INTRAVENOUS | Status: AC
  Filled 2022-01-21: qty 856.8

## 2022-01-21 MED ORDER — ENOXAPARIN SODIUM 30 MG/0.3ML IJ SOSY
30.0000 mg | PREFILLED_SYRINGE | INTRAMUSCULAR | Status: DC
Start: 2022-01-21 — End: 2022-01-21

## 2022-01-21 MED ORDER — SIMETHICONE 40 MG/0.6ML PO SUSP
40.0000 mg | Freq: Four times a day (QID) | ORAL | Status: DC | PRN
Start: 2022-01-21 — End: 2022-01-26
  Administered 2022-01-21 – 2022-01-26 (×7): 40 mg
  Filled 2022-01-21 (×10): qty 0.6

## 2022-01-21 NOTE — Evaluation (Signed)
Passy-Muir Speaking Valve - Evaluation Patient Details  Name: [name] MRN: [id number] Date of Birth: October 07, 1965  Today's Date: 01/21/2022 Time: 1150-1212 SLP Time Calculation (min) (ACUTE ONLY): 22 min  Past Medical History:  Past Medical History:  Diagnosis Date   Abdominal pain    Accidental drug overdose April 2013   Anxiety    Atrial fibrillation ([place]) 09/29/11   converted spontaneously   Chronic back pain    Chronic knee pain    Chronic nausea    Chronic pain    COPD (chronic obstructive pulmonary disease) ([place])    Depression    Diabetes mellitus    states her doctor took her off all DM meds in past month   Diabetic neuropathy ([place])    Dyspnea    with exertion    GERD (gastroesophageal reflux disease)    Headache(784.0)    migraines    HTN (hypertension)    not on meds since in a year    Hyperlipidemia    Hypothyroidism    not on meds in a while    Mental disorder    Bipolar and schizophrenic   Requires supplemental oxygen    as needed per patient    Schizophrenia ([place])    Schizophrenia, acute ([place]) 11/13/2017   Tobacco abuse    Past Surgical History:  Past Surgical History:  Procedure Laterality Date   ABDOMINAL HYSTERECTOMY     BLADDER SUSPENSION  03/04/2011   Procedure: [facility] PROCEDURE;  Surgeon: Elayne Snare MacDiarmid;  Location: [facility];  Service: Urology;  Laterality: N/A;   CYSTOCELE REPAIR  03/04/2011   Procedure: ANTERIOR REPAIR (CYSTOCELE);  Surgeon: Reece Packer;  Location: [facility] [facility];  Service: Urology;  Laterality: N/A;   CYSTOSCOPY  03/04/2011   Procedure: CYSTOSCOPY;  Surgeon: Elayne Snare MacDiarmid;  Location: [facility];  Service: Urology;  Laterality: N/A;   ESOPHAGOGASTRODUODENOSCOPY (EGD) WITH PROPOFOL N/A 05/12/2017   Procedure: ESOPHAGOGASTRODUODENOSCOPY (EGD) WITH PROPOFOL;  Surgeon: Alphonsa Overall, MD;  Location: [facility];  Service: General;  Laterality: N/A;   GASTRIC ROUX-EN-Y N/A 03/25/2016   Procedure: LAPAROSCOPIC ROUX-EN-Y GASTRIC  BYPASS WITH UPPER ENDOSCOPY;  Surgeon: Excell Seltzer, MD;  Location: [facility];  Service: General;  Laterality: N/A;   KNEE SURGERY     LAPAROSCOPIC ASSISTED VAGINAL HYSTERECTOMY  03/04/2011   Procedure: LAPAROSCOPIC ASSISTED VAGINAL HYSTERECTOMY;  Surgeon: Cyril Mourning, MD;  Location: [facility];  Service: Gynecology;  Laterality: N/A;   HPI:  Pt is a 56 y.o. female admitted 01/12/22 after suicide attempt with intentional drug overdose of promethazine. ETT 7/23-7/25; reintubated for hypoxia 7/26- trach 7/30. ATC initiated on 7/31. EEG 7/31: moderate severe diffuse encephalopathy, nonspecific etiology; no seizures. Cortrak placed 7/31. PMH: Afib, polysubstance abuse, COPD, DM, neuropathy, HTN, bipolar disorder, schizophrenia, anxiety, depression, ETOH abuse.    Assessment / Plan / Recommendation  Clinical Impression  Pt was seen for PMSV evaluation. She was alert and cooperative throughout the evaluation  and her mother was present. Pt and her mother were educated regarding the anatomy of the larynx, the impact of the trach on voicing, and the goals of the session. Understanding was indicated. Cuff was deflated upon SLP's arrival and vitals were RR 36-38, SpO2 84-86, HR 101. Pt's RN was present and RT subsequently arrived to assess pt and conduct tracheal suctioning. Vitals improved to RR 24, SpO2 97, HR 102 following suctioning and RT agreed with proceeding with PMSV evaluation. Pt tolerated PMSV for 4 minutes with vitals ranging RR 24-27,  SpO2 89-91, and HR 102-105. However, PMSV was removed thereafter due to progressive tachypnea up to 41, desatting to 87%, and increasing WOB. Vocal quality was breathy with vocal intensity significantly reduced. Repetition was frequently necessary due to reduced speech intelligibility. PMSV was removed at the end of session and cuff was left deflated. It is recommended that PMSV be used with SLP only at this time. Bedside swallow evaluation will be completed on  subsequent date when pt's respiratory status appears to better support this. SLP will follow pt. SLP Visit Diagnosis: Aphonia (R49.1)    SLP Assessment  Patient needs continued Speech [place] [facility] Services    Recommendations for follow up therapy are one component of a multi-disciplinary discharge planning process, led by the attending physician.  Recommendations may be updated based on patient status, additional functional criteria and insurance authorization.  Follow Up Recommendations  Skilled nursing-short term rehab (<3 hours/day)    Assistance Recommended at Discharge    Functional Status Assessment Patient has had a recent decline in their functional status and demonstrates the ability to make significant improvements in function in a reasonable and predictable amount of time.  Frequency and Duration min 2x/week  2 weeks    PMSV Trial PMSV was placed for: 4 Able to redirect subglottic air through upper airway: Yes Able to Attain Phonation: Yes Voice Quality: Breathy;Low vocal intensity Able to Expectorate Secretions: No Breath Support for Phonation: Severely decreased Intelligibility: Intelligibility reduced Word: 75-100% accurate Phrase: 50-74% accurate Sentence: Not tested Conversation: Not tested Respirations During Trial:  (24-27) SpO2 During Trial:  (89-91) Pulse During Trial:  (102-105) Behavior: Alert;Cooperative;Expresses self well   Tracheostomy Tube       Vent Dependency  FiO2 (%): (S) 80 %    Cuff Deflation Trial Tolerated Cuff Deflation: Yes (deflated at baseline) Behavior: Alert;Cooperative;Responsive to questions         [name], [place], [place] Office number [phone]  Horton Marshall 01/21/2022, 12:27 PM

## 2022-01-21 NOTE — Progress Notes (Signed)
NAME:  [name], MRN:  [id number], DOB:  [birth year], LOS: 9 ADMISSION DATE:  01/12/2022, CONSULTATION DATE:  01/12/21 REFERRING MD:  Sedonia Small - EM, CHIEF COMPLAINT:  Intentional Overdose    History of Present Illness:  56 yo F PMH schizoaffective disorder (bipolar type), Depression, prior SI, polysubstance use disorder, COPD, HTN, DM presented to the ED following suicide attempt. Patient had been drinking, while with friends stated that she was going to kill herself and was witnessed ingesting a bottle of promethazine.   In transport with EMS became more somnolent. Arrived to ED with GCS 3 and was intubated in this setting. EM discussed case with poison control who recommended supportive care  PCCM consulted for admission in this setting.  Pertinent Medical History:  Schizoaffective disorder, bipolar type Depression Polysubstance abuse  COPD HTN DM    Significant Hospital Events: Including procedures, antibiotic start and stop dates in addition to other pertinent events   7/23 ED after etoh consumption and intentional promethazine overdose. Intubated. Admit to ICU  7/24 Tachycardic to 130s overnight, also with episode of whole body stiffening, ?dystonia vs. seizure. EEG with irritability, LTM initiated. Neuro consulted. 7/25 Extubated, intermittently somnolent/agitated, not managing secretions well. O2 sats stable on [place]. Cortrak difficult to flush. 7/26 Reintubated early AM for hypoxia, airway protection. Unasyn initiated for ?aspiration PNA. LTM EEG discontinued. MRI pending. 7/27 MRI no acute changes. Wakes up follows commands on .7 dex  7/28 off NE  01/19/2022>> Trach after goals of care discussion with daughter 7/31 Transitioned to ATC.   Interim History / Subjective:  Precedex down from 1.2 to 0.8  X4 BMS  On trach collar, 40%, was on ventilator for two hours overnight.   No acute events overnight.   + 5 L admit, 1000 UOP past 24  Tmax 99.8   Objective:  Blood  pressure 119/76, pulse 98, temperature 99.8 F (37.7 C), temperature source Oral, resp. rate (!) 24, height 5' (1.524 m), weight 44.4 kg, last menstrual period 01/08/2011, SpO2 96 %.    Vent Mode: PRVC FiO2 (%):  [40 %-60 %] 40 % Set Rate:  [18 bmp] 18 bmp Vt Set:  [360 mL] 360 mL PEEP:  [5 cmH20] 5 cmH20 Pressure Support:  [10 cmH20] 10 cmH20 Plateau Pressure:  [20 cmH20] 20 cmH20   Intake/Output Summary (Last 24 hours) at 01/21/2022 1025 Last data filed at 01/21/2022 1000 Gross per 24 hour  Intake 2333.32 ml  Output 1110 ml  Net 1223.32 ml   Filed Weights   01/19/22 0400 01/20/22 0500 01/21/22 0344  Weight: 44.9 kg 45.5 kg 44.4 kg   Physical Examination: General: In bed, NAD, appears somewhat uncomfortable, on side, thin HEENT: MM pink/moist, anicteric, atraumatic Neuro: RASS 0, PERRL 34m, GCS 11t CV: S1S2, NSR, no m/r/g appreciated PULM:  clear in the upper lobes, clear in the lower lobes, trachea midline, chest expansion symmetric GI: soft, bsx4 active, generalized tenderness   Extremities: warm/dry, no pretibial edema, capillary refill less than 3 seconds  Skin:  no rashes or lesions noted  Labs BG 158-225 K 3.9, Mag 1.7 Creat 0.35 WBC 9.7>12.2 HGB 7.4>7.7, MCV 70 CXR: stable trach, no pneumo, small effusion Bl KUB: Per radiology-Persistent bowel distension with mild improvement in comparison to prior exam. Chest x-ray: No pneumothorax, no obvious infiltrate, question small bilateral pleural effusions  Resolved Hospital Problem List:   Hypotension, suspect sedation related    Assessment & Plan:  Acute toxic metabolic encephalopathy, initially in setting of intentional  overdose  Hx seizure  -MRI brain 7/27 no acute abnormalities  P -Continue Keppra 500 mg twice daily -Continue Precedex.  Goal RASS 0 to -1 -Continue delirium precautions  SI with intentional promethazine overdose Schizoaffective disorder -- Bipolar type MDD  Polysubstance use disorder   Phenobarb taper completed. On Abilify at home? P -We will engage psych 8 /1-8/2.  IVC.  Passy-Muir evaluation pending  Acute respiratory failure with hypoxia initially in setting of overdose, then 2/2 aspiration - Extubated 7/25AM, unable to manage secretions throughout the day due to varying agitation/somnolence and inability to follow commands/redirect, reintubated 7/26 - -think her ileus contributed to reintubation Aspiration Event Hx COPD without acute exacerbation  Tobacco use disorder  Course of Unasyn completed.  On trach collar.  Placed on ventilator for 2 hours overnight secondary to tachypnea. Chest x-ray: No pneumothorax, no obvious infiltrate, question small bilateral pleural effusions P -Follow fever WBC curve -Goal SPO2 90 to 96%.  Wean oxygen to goal -Pulmonary toilet as able.  Deep breathe and cough.  Up to chair. -PT OT -Do not place back on the ventilator unless respiratory rate greater than 40.  Ileus - ongoing issue, has gotten relistor, subQ neostigmine, numerous agents from above and below.  KUB with improvement per radiology.  4 bowel movements overnight.  1 bowel movement on 8/1 P -Continue twice daily smog enemas -Continue TPN  Hx HTN  On dilt for this at home P -Continue holding home anti-hypertensives  Hypokalemia Hypomagnesia P -Replete -Follow-up a.m. labs -Trend on BMP   Iron deficiency anemia Notable hx of allergic rxn to iron transfusion previously.  Required 2 units this admit.  No s/s of bleeding. HGB 7.4>7.7, MCV 70 P -Continue iron supplements -last transfusion 7/29, resuming heparin  DM2  -Continue sign scale insulin  Severe protein calorie malnutrition  Hx gastric bypass  History of gastric bypass ~2017, at that time weight 240lb. Weight down to 88lb, most significant weight loss over the last 3 years. P -Continue TPN, monitor for refeeding syndrome   Best Practice (right click and "Reselect all SmartList Selections" daily)    Diet/type: TPN DVT prophylaxis: SCD GI prophylaxis: PPI Lines: N/A Foley:  N/A Code Status:  full code Last date of multidisciplinary goals of care discussion [7/28 full scope]  Critical care time: 35 minutes  Redmond School., MSN, APRN, AGACNP-BC  Pulmonary & Critical Care  01/21/2022 , 10:25 AM  Please see [email or web address] for pager details  If no response, please call [phone] After hours, please call Elink at [phone]

## 2022-01-21 NOTE — Plan of Care (Signed)
Patient remains in CVICU. Requires mechanical ventilation overnight for respiratory distress. Requiring continuous infusion of Precedex. Remains NPO and on TPN via PICC. Progress towards goals limited by anxiety and agitation overnight.   Problem: Education: Goal: Ability to describe self-care measures that may prevent or decrease complications (Diabetes Survival Skills Education) will improve Outcome: Not Progressing Goal: Individualized Educational Video(s) Outcome: Not Progressing   Problem: Coping: Goal: Ability to adjust to condition or change in health will improve Outcome: Not Progressing   Problem: Fluid Volume: Goal: Ability to maintain a balanced intake and output will improve Outcome: Not Progressing   Problem: Health Behavior/Discharge Planning: Goal: Ability to identify and utilize available resources and services will improve Outcome: Not Progressing Goal: Ability to manage health-related needs will improve Outcome: Not Progressing   Problem: Metabolic: Goal: Ability to maintain appropriate glucose levels will improve Outcome: Not Progressing   Problem: Nutritional: Goal: Maintenance of adequate nutrition will improve Outcome: Not Progressing Goal: Progress toward achieving an optimal weight will improve Outcome: Not Progressing   Problem: Skin Integrity: Goal: Risk for impaired skin integrity will decrease Outcome: Not Progressing   Problem: Tissue Perfusion: Goal: Adequacy of tissue perfusion will improve Outcome: Not Progressing   Problem: Education: Goal: Knowledge of General Education information will improve Description: Including pain rating scale, medication(s)/side effects and non-pharmacologic comfort measures Outcome: Not Progressing   Problem: Health Behavior/Discharge Planning: Goal: Ability to manage health-related needs will improve Outcome: Not Progressing   Problem: Clinical Measurements: Goal: Ability to maintain clinical measurements  within normal limits will improve Outcome: Not Progressing Goal: Will remain free from infection Outcome: Not Progressing Goal: Diagnostic test results will improve Outcome: Not Progressing Goal: Respiratory complications will improve Outcome: Not Progressing Goal: Cardiovascular complication will be avoided Outcome: Not Progressing   Problem: Activity: Goal: Risk for activity intolerance will decrease Outcome: Not Progressing   Problem: Nutrition: Goal: Adequate nutrition will be maintained Outcome: Not Progressing   Problem: Coping: Goal: Level of anxiety will decrease Outcome: Not Progressing   Problem: Elimination: Goal: Will not experience complications related to bowel motility Outcome: Not Progressing Goal: Will not experience complications related to urinary retention Outcome: Not Progressing   Problem: Pain Managment: Goal: General experience of comfort will improve Outcome: Not Progressing   Problem: Safety: Goal: Ability to remain free from injury will improve Outcome: Not Progressing   Problem: Skin Integrity: Goal: Risk for impaired skin integrity will decrease Outcome: Not Progressing   Problem: Safety: Goal: Non-violent Restraint(s) Outcome: Not Progressing

## 2022-01-21 NOTE — Progress Notes (Signed)
Physical Therapy Treatment Patient Details Name: [name] MRN: [id number] DOB: January 14, 1966 Today's Date: 01/21/2022   History of Present Illness 56 y.o. female admitted 01/12/22 after suicide attempt with intentional promethazine OD. EEG without seizures; showed evidence of epileptogenicity. ETT 7/23-7/25. Reintubated 7/26. Ileus. 7/30 extubated but then trached and returned to vent. PMHx:Afib, COPD, DM, neuropathy, HTN, bipolar disorder, schizophrenia, anxiety, depression, ETOH abuse.    PT Comments    Pt on trach collar at 35% FiO2 on arrival and required increase to 50% with mobility to maintain 90% but able to return to 35% at rest with SpO2 91%. Pt with good ability to stand today but limited by fatigue and abdominal pain. RN denied attempting OOB to chair today but hopeful to attempt next session. Will continue to follow and progress mobility as pt medically able to tolerate.      Recommendations for follow up therapy are one component of a multi-disciplinary discharge planning process, led by the attending physician.  Recommendations may be updated based on patient status, additional functional criteria and insurance authorization.  Follow Up Recommendations  Skilled nursing-short term rehab (<3 hours/day) Can patient physically be transported by private vehicle: No   Assistance Recommended at Discharge Frequent or constant Supervision/Assistance  Patient can return home with the following A lot of help with walking and/or transfers;A lot of help with bathing/dressing/bathroom;Assist for transportation;Help with stairs or ramp for entrance;Assistance with cooking/housework;Direct supervision/assist for medications management;Direct supervision/assist for financial management   Equipment Recommendations  Rolling walker (2 wheels);BSC/3in1    Recommendations for Other Services       Precautions / Restrictions Precautions Precautions: Fall;Other (comment) Precaution Comments:  trach, cortrak, bil wrist restraints, watch sats     Mobility  Bed Mobility Overal bed mobility: Needs Assistance Bed Mobility: Supine to Sit, Sit to Supine     Supine to sit: Min assist, HOB elevated     General bed mobility comments: HOB 30 degrees with min assist to clear trunk from surface to rise. return to bed with guarding for lines and safety. Pt with min assist and mod cue to slide toward [facility]    Transfers Overall transfer level: Needs assistance   Transfers: Sit to/from Stand Sit to Stand: Min assist           General transfer comment: min assist with knee blocked, bil UE support on therapist with face to face technique. Pt stood grossly 15 sec on 35% trach collar with desaturation to 84% requiring seated rest, increased FiO2 to 50% and grossly 3 min recovery. On 50% FiO2 pt able to stand for 1 min with SpO2 91% prior to fatigue and return to supine    Ambulation/Gait               General Gait Details: not yet able   Stairs             Wheelchair Mobility    Modified Rankin (Stroke Patients Only)       Balance Overall balance assessment: Needs assistance Sitting-balance support: No upper extremity supported, Feet supported Sitting balance-Leahy Scale: Fair Sitting balance - Comments: sitting EOB with guarding for lines and safety     Standing balance-Leahy Scale: Poor Standing balance comment: bil UE support in standing                            Cognition Arousal/Alertness: Awake/alert Behavior During Therapy: Flat affect Overall Cognitive Status: Impaired/Different from baseline  Area of Impairment: Orientation, Safety/judgement                 Orientation Level: Disoriented to, Time   Memory: Decreased short-term memory Following Commands: Follows one step commands consistently Safety/Judgement: Decreased awareness of deficits     General Comments: pt oriented to self and place, following commands throughout  session and not pulling at any lines or trach today. Pt eager to move        Exercises      General Comments        Pertinent Vitals/Pain Pain Assessment Pain Score: 4  Pain Location: stomach Pain Descriptors / Indicators: Aching, Guarding Pain Intervention(s): Limited activity within patient's tolerance, Monitored during session, Repositioned    Home Living                          Prior Function            PT Goals (current goals can now be found in the care plan section) Progress towards PT goals: Progressing toward goals    Frequency    Min 3X/week      PT Plan Current plan remains appropriate    Co-evaluation              AM-PAC PT "6 Clicks" Mobility   Outcome Measure  Help needed turning from your back to your side while in a flat bed without using bedrails?: A Little Help needed moving from lying on your back to sitting on the side of a flat bed without using bedrails?: A Little Help needed moving to and from a bed to a chair (including a wheelchair)?: A Lot Help needed standing up from a chair using your arms (e.g., wheelchair or bedside chair)?: A Little Help needed to walk in hospital room?: Total Help needed climbing 3-5 steps with a railing? : Total 6 Click Score: 13    End of Session Equipment Utilized During Treatment: Oxygen Activity Tolerance: Patient tolerated treatment well Patient left: in bed;with call bell/phone within reach;with nursing/sitter in room;with bed alarm set (restraints off with sitter present and RN in agreement for trial) Nurse Communication: Mobility status PT Visit Diagnosis: Other abnormalities of gait and mobility (R26.89);Difficulty in walking, not elsewhere classified (R26.2);Muscle weakness (generalized) (M62.81)     Time: 8295-6213 PT Time Calculation (min) (ACUTE ONLY): 17 min  Charges:  $Therapeutic Activity: 8-22 mins                     Bayard Males, PT Acute Rehabilitation Services Office:  [phone]    [name] 01/21/2022, 10:08 AM

## 2022-01-21 NOTE — Progress Notes (Signed)
Placed patient back on full support ventilation due to increased WOB, tachypnea, desaturation. Completed 17 hrs ATC 40%.

## 2022-01-21 NOTE — Inpatient Diabetes Management (Signed)
Inpatient Diabetes Program Recommendations  AACE/ADA: New Consensus Statement on Inpatient Glycemic Control (2015)  Target Ranges:  Prepandial:   less than 140 mg/dL      Peak postprandial:   less than 180 mg/dL (1-2 hours)      Critically ill patients:  140 - 180 mg/dL    Latest Reference Range & Units 01/19/22 23:11 01/20/22 03:41 01/20/22 07:28 01/20/22 11:34 01/20/22 15:52 01/20/22 20:05  Glucose-Capillary 70 - 99 mg/dL 217 (H) 174 (H) 138 (H) 185 (H) 157 (H) 182 (H)  (H): Data is abnormally high  Latest Reference Range & Units 01/20/22 23:50 01/21/22 03:06 01/21/22 07:43  Glucose-Capillary 70 - 99 mg/dL 205 (H) 158 (H) 230 (H)  (H): Data is abnormally high   Home DM Meds: Metformin 500 mg BID    Novolog also listed but NOT taking    Current Orders: Novolog 0-9 units Q4H.      Getting TPN 70 cc/hr (No Insulin in the TPN) due to Prolonged Ileus.    Cortrak feeding tube placed yest--No TF ordered yet.      MD/Pharmacy--Note only 1 CBG >200 yesterday 7/31  Couple of minor elevations today  If CBGs trend upward, may consider adding Insulin to TPN   Could also increase the Novolog SSI to the Moderate scale 0-15 units Q4 hours    --Will follow patient during hospitalization--  Wyn Quaker RN, MSN, [place] Diabetes Coordinator Inpatient Glycemic Control Team Team Pager: [phone] (8a-5p)

## 2022-01-21 NOTE — Progress Notes (Signed)
PHARMACY - TOTAL PARENTERAL NUTRITION CONSULT NOTE  Indication: Prolonged ileus  Patient Measurements: Height: 5' (152.4 cm) Weight: 44.4 kg (97 lb 14.2 oz) IBW/kg (Calculated) : 45.5 TPN AdjBW (KG): 42.2 Body mass index is 19.12 kg/m. Usual weight:  240 lbs in 2017, now 88 lbs with significant weight loss over the past 3 yrs  Assessment:  60 YOF with history of polysubstance abuse and bariatric surgery presented on 7/23 s/p intentional overdose.  CT and abd XR showed large amount of stool in the colon and an ileus. She has severe PCM with minimal nutrition since admit (TF from 7/23 until 7/25), so Pharmacy consulted to manage TPN.  Glucose / Insulin: hx DM on ?metformin PTA - hypoglycemic x2 on 7/29 prior to TPN initiation, otherwise < 180.  Used 10 units SSI in the past 24 hrs. Electrolytes: K 3.9 (goal K >/= 4), Mag 1.7 (goal Mag >/= 2), others WNL Renal: SCr < 1, BUN WNL Hepatic: LFTs / tbili / TG WNL, albumin 1.6 Intake / Output; MIVF: UOP 0.9 ml/kg/hr, BM x 4 on 7/31 Vitamin/Mineral panel: low Vit D/A/C.  Vitamins B1/B12, copper, zinc and folate WNL. GI Imaging: none since TPN initiation  GI Surgeries / Procedures: none since TPN initiation  Central access: PICC placed 01/17/22 TPN start date: 01/18/22  Nutritional Goals: RD Estimated Needs Total Energy Estimated Needs: 1600-1800 kcals Total Protein Estimated Needs: 80-90 g Total Fluid Estimated Needs: >/= 1.6 L  Current Nutrition:  TPN  Plan:  Continue TPN to goal rate of 70 ml/hr at 1800 to provide 86g AA, 244g CHO and 44g ILE for a total of 1607 kCal, meeting 100% of needs Electrolytes in TPN: Na 53mq/L, K 570m/L, Ca 17m51mL, Mg 4mE40m, Phos 12mm2m, max acetate  Add standard MVI and trace elements to TPN  Continue sensitive SSI Q4H Standard TPN labs on Mon and Thurs KCL 10 meq x 4 Mag 2 gms x 1 - ordered CCM considering GI consult for decompressive flex sig - f/u F/u PO Vit A, D and C supplementation when taking  orals  CathyAlanda SlimrmD, FCCM [facility] Services[facility] Pharmacist Please see AMION for all Pharmacists' Contact Phone Numbers 01/21/2022, 8:48 AM

## 2022-01-22 ENCOUNTER — Inpatient Hospital Stay (HOSPITAL_COMMUNITY): Payer: Medicare Other

## 2022-01-22 DIAGNOSIS — K567 Ileus, unspecified: Secondary | ICD-10-CM | POA: Diagnosis not present

## 2022-01-22 DIAGNOSIS — T50902D Poisoning by unspecified drugs, medicaments and biological substances, intentional self-harm, subsequent encounter: Secondary | ICD-10-CM | POA: Diagnosis not present

## 2022-01-22 DIAGNOSIS — T50902A Poisoning by unspecified drugs, medicaments and biological substances, intentional self-harm, initial encounter: Secondary | ICD-10-CM

## 2022-01-22 DIAGNOSIS — E43 Unspecified severe protein-calorie malnutrition: Secondary | ICD-10-CM | POA: Diagnosis not present

## 2022-01-22 DIAGNOSIS — E876 Hypokalemia: Secondary | ICD-10-CM | POA: Diagnosis not present

## 2022-01-22 LAB — BPAM RBC
Blood Product Expiration Date: 202308152359
Blood Product Expiration Date: 202308162359
ISSUE DATE / TIME: 202307291127
Unit Type and Rh: 6200
Unit Type and Rh: 6200

## 2022-01-22 LAB — BASIC METABOLIC PANEL
Anion gap: 7 (ref 5–15)
BUN: 7 mg/dL (ref 6–20)
CO2: 24 mmol/L (ref 22–32)
Calcium: 7.8 mg/dL — ABNORMAL LOW (ref 8.9–10.3)
Chloride: 103 mmol/L (ref 98–111)
Creatinine, Ser: 0.33 mg/dL — ABNORMAL LOW (ref 0.44–1.00)
GFR, Estimated: >60 mL/min (ref >60)
Glucose, Bld: 191 mg/dL — ABNORMAL HIGH (ref 70–99)
Potassium: 4.2 mmol/L (ref 3.5–5.1)
Sodium: 134 mmol/L — ABNORMAL LOW (ref 135–145)

## 2022-01-22 LAB — TYPE AND SCREEN
ABO/RH(D): A POS
Antibody Screen: POSITIVE
Donor AG Type: NEGATIVE
Donor AG Type: NEGATIVE
Unit division: 0
Unit division: 0

## 2022-01-22 LAB — CBC
HCT: 26.5 % — ABNORMAL LOW (ref 36.0–46.0)
Hemoglobin: 7.7 g/dL — ABNORMAL LOW (ref 12.0–15.0)
MCH: 20.3 pg — ABNORMAL LOW (ref 26.0–34.0)
MCHC: 29.1 g/dL — ABNORMAL LOW (ref 30.0–36.0)
MCV: 69.9 fL — ABNORMAL LOW (ref 80.0–100.0)
Platelets: 322 10*3/uL (ref 150–400)
RBC: 3.79 MIL/uL — ABNORMAL LOW (ref 3.87–5.11)
RDW: 27 % — ABNORMAL HIGH (ref 11.5–15.5)
WBC: 15.5 10*3/uL — ABNORMAL HIGH (ref 4.0–10.5)
nRBC: 0 % (ref 0.0–0.2)

## 2022-01-22 LAB — URINALYSIS, ROUTINE W REFLEX MICROSCOPIC
Bilirubin Urine: NEGATIVE
Glucose, UA: NEGATIVE mg/dL
Ketones, ur: NEGATIVE mg/dL
Nitrite: NEGATIVE
Protein, ur: NEGATIVE mg/dL
Specific Gravity, Urine: 1.011 (ref 1.005–1.030)
WBC, UA: >50 WBC/hpf — ABNORMAL HIGH (ref 0–5)
pH: 5 (ref 5.0–8.0)

## 2022-01-22 LAB — GLUCOSE, CAPILLARY
Glucose-Capillary: 198 mg/dL — ABNORMAL HIGH (ref 70–99)
Glucose-Capillary: 196 mg/dL — ABNORMAL HIGH (ref 70–99)
Glucose-Capillary: 234 mg/dL — ABNORMAL HIGH (ref 70–99)
Glucose-Capillary: 245 mg/dL — ABNORMAL HIGH (ref 70–99)
Glucose-Capillary: 189 mg/dL — ABNORMAL HIGH (ref 70–99)

## 2022-01-22 LAB — MAGNESIUM: Magnesium: 2 mg/dL (ref 1.7–2.4)

## 2022-01-22 MED ORDER — SODIUM CHLORIDE 0.9 % IV SOLN
1.0000 g | INTRAVENOUS | Status: AC
  Administered 2022-01-22 – 2022-01-26 (×5): 1 g via INTRAVENOUS
  Filled 2022-01-22 (×5): qty 10

## 2022-01-22 MED ORDER — LACTULOSE 10 GM/15ML PO SOLN
30.0000 g | Freq: Three times a day (TID) | ORAL | Status: DC
  Administered 2022-01-22 – 2022-01-26 (×6): 30 g
  Filled 2022-01-22 (×6): qty 45

## 2022-01-22 MED ORDER — MIDAZOLAM HCL 2 MG/2ML IJ SOLN
INTRAMUSCULAR | Status: AC
  Administered 2022-01-22: 2 mg via INTRAVENOUS
  Filled 2022-01-22: qty 2

## 2022-01-22 MED ORDER — MIDAZOLAM HCL 2 MG/2ML IJ SOLN: 2.0000 mg | Freq: Once | INTRAMUSCULAR | Status: AC

## 2022-01-22 MED ORDER — IOHEXOL 9 MG/ML PO SOLN
ORAL | Status: AC
  Administered 2022-01-22: 1000 mL
  Filled 2022-01-22: qty 1000

## 2022-01-22 MED ORDER — GUAIFENESIN 100 MG/5ML PO LIQD: 5.0000 mL | ORAL | Status: DC | PRN

## 2022-01-22 MED ORDER — INSULIN ASPART 100 UNIT/ML IJ SOLN
0.0000 [IU] | INTRAMUSCULAR | Status: DC
  Administered 2022-01-22: 3 [IU] via SUBCUTANEOUS
  Administered 2022-01-22: 5 [IU] via SUBCUTANEOUS
  Administered 2022-01-22: 3 [IU] via SUBCUTANEOUS
  Administered 2022-01-22: 5 [IU] via SUBCUTANEOUS
  Administered 2022-01-23: 11 [IU] via SUBCUTANEOUS
  Administered 2022-01-23: 3 [IU] via SUBCUTANEOUS
  Administered 2022-01-23: 5 [IU] via SUBCUTANEOUS
  Administered 2022-01-23: 11 [IU] via SUBCUTANEOUS
  Administered 2022-01-24: 5 [IU] via SUBCUTANEOUS
  Administered 2022-01-24: 3 [IU] via SUBCUTANEOUS
  Administered 2022-01-24: 2 [IU] via SUBCUTANEOUS
  Administered 2022-01-24: 3 [IU] via SUBCUTANEOUS
  Administered 2022-01-24: 5 [IU] via SUBCUTANEOUS
  Administered 2022-01-24: 11 [IU] via SUBCUTANEOUS
  Administered 2022-01-25 (×3): 3 [IU] via SUBCUTANEOUS
  Administered 2022-01-25: 5 [IU] via SUBCUTANEOUS
  Administered 2022-01-25 (×2): 3 [IU] via SUBCUTANEOUS
  Administered 2022-01-25: 8 [IU] via SUBCUTANEOUS
  Administered 2022-01-26: 11 [IU] via SUBCUTANEOUS
  Administered 2022-01-26: 5 [IU] via SUBCUTANEOUS

## 2022-01-22 MED ORDER — SORBITOL 70 % SOLN
30.0000 mL | Freq: Once | Status: AC
Start: 2022-01-22 — End: 2022-01-22
  Administered 2022-01-22: 30 mL via ORAL
  Filled 2022-01-22: qty 30

## 2022-01-22 MED ORDER — TRAVASOL 10 % IV SOLN
INTRAVENOUS | Status: AC
  Filled 2022-01-22: qty 856.8

## 2022-01-22 NOTE — Progress Notes (Signed)
NAME:  [name], MRN:  [id number], DOB:  05-17-1966, LOS: 43 ADMISSION DATE:  01/12/2022, CONSULTATION DATE:  01/12/21 REFERRING MD:  Sedonia Small - EM, CHIEF COMPLAINT:  Intentional Overdose    History of Present Illness:  56 yo F PMH schizoaffective disorder (bipolar type), Depression, prior SI, polysubstance use disorder, COPD, HTN, DM presented to the ED following suicide attempt. Patient had been drinking, while with friends stated that she was going to kill herself and was witnessed ingesting a bottle of promethazine.   In transport with EMS became more somnolent. Arrived to ED with GCS 3 and was intubated in this setting. EM discussed case with poison control who recommended supportive care  PCCM consulted for admission in this setting.  Pertinent Medical History:  Schizoaffective disorder, bipolar type Depression Polysubstance abuse  COPD HTN DM    Significant Hospital Events: Including procedures, antibiotic start and stop dates in addition to other pertinent events   7/23 ED after etoh consumption and intentional promethazine overdose. Intubated. Admit to ICU  7/24 Tachycardic to 130s overnight, also with episode of whole body stiffening, ?dystonia vs. seizure. EEG with irritability, LTM initiated. Neuro consulted. 7/25 Extubated, intermittently somnolent/agitated, not managing secretions well. O2 sats stable on [place]. Cortrak difficult to flush. 7/26 Reintubated early AM for hypoxia, airway protection. Unasyn initiated for ?aspiration PNA. LTM EEG discontinued. MRI pending. 7/27 MRI no acute changes. Wakes up follows commands on .7 dex  7/28 off NE  01/19/2022>> Trach after goals of care discussion with daughter 7/31 Transitioned to ATC.   Interim History / Subjective:  No acute events overnight.  Patient had to go back on ventilator from 8 PM to 8 AM  Subjective: Complains of continued abdominal pain  Precedex down from 1.2-0.8  FiO2 increased from 40 to 60%  Tmax  100.2  +2.8 liters past 24 hours, X2 documented bowel movements, 775 mL urine output.   Objective:  Blood pressure (!) 99/49, pulse (!) 102, temperature 99.3 F (37.4 C), temperature source Oral, resp. rate (!) 33, height 5' (1.524 m), weight 44.7 kg, last menstrual period 01/08/2011, SpO2 97 %.    Vent Mode: PRVC FiO2 (%):  [40 %-80 %] 60 % Set Rate:  [18 bmp] 18 bmp Vt Set:  [360 mL] 360 mL PEEP:  [5 cmH20] 5 cmH20 Plateau Pressure:  [15 cmH20-22 cmH20] 19 cmH20   Intake/Output Summary (Last 24 hours) at 01/22/2022 1105 Last data filed at 01/22/2022 0700 Gross per 24 hour  Intake 1937.87 ml  Output 665 ml  Net 1272.87 ml   Filed Weights   01/20/22 0500 01/21/22 0344 01/22/22 0400  Weight: 45.5 kg 44.4 kg 44.7 kg   Physical Examination: General: In bed, NAD, uncomfortable appearing HEENT: MM pink/moist, anicteric, atraumatic Neuro: RASS 0, PERRL 57m, GCS 11t CV: S1S2, ST, no m/r/g appreciated PULM:  air movement in alll lobes, trachea midline, chest expansion symmetric GI: rounded, soft, bsx4 active, generalized tenderness   Extremities: warm/dry, no pretibial edema, capillary refill less than 3 seconds  Skin:  no rashes or lesions noted  Labs Blood glucose 179-228 WBC 9.7 > 12.29 > 15.5 Hemoglobin 7.7 > 7.7 Sodium 134 Magnesium 2 KUB: Persistent bowel distention with mild improvement in comparison to prior exam per radiology  Resolved Hospital Problem List:   Hypotension, suspect sedation related    Assessment & Plan:  Acute toxic metabolic encephalopathy, initially in setting of intentional overdose  Hx seizure  -MRI brain 7/27 no acute abnormalities  P -Continue  Keppra 500 mg twice daily -Continue Precedex.  Goal RASS 0 to -1 -Continue delirium precautions  SI with intentional promethazine overdose Schizoaffective disorder -- Bipolar type MDD  Polysubstance use disorder  Phenobarb taper completed. On Abilify at home? P -Psych to see patient on  8/2 -Continue IVC  Acute respiratory failure with hypoxia initially in setting of overdose, then 2/2 aspiration - Extubated 7/25AM, unable to manage secretions throughout the day due to varying agitation/somnolence and inability to follow commands/redirect, reintubated 7/26 - -think her ileus contributed to reintubation Aspiration Event Hx COPD without acute exacerbation  Tobacco use disorder  Course of Unasyn completed.  On trach collar.  Placed on ventilator overnight.  FiO2 increased from 40 to 60%.  Clearing secretions P -Follow fever WBC curve -Goal SPO2 90 to 96%.  Wean oxygen to goal. -Pulmonary toilet as able.  Deep breathe and cough.  Up to chair as able.  Leukocytosis WBC 9.7 > 12.29 > 15.5.  -Follow fever WBC curve -Obtain lower extremity Dopplers to rule out DVT.  Patient off DVT prophylaxis for several days.  Abdominal pain Ileus - ongoing issue, has gotten relistor, subQ neostigmine, numerous agents from above and below.  KUB with improvement per radiology. 2 documented bowel movements on 8/1 P -Obtain abdominal CT -Continue smog enemas.  -Continue TPN  Hx HTN  On dilt for this at home P -Continue holding home antihypertensives  Hypokalemia-improving Hypomagnesia-improving Hyponatremia NA 134 P -Monitor on a.m. labs. -No free water  Iron deficiency anemia Notable hx of allergic rxn to iron transfusion previously.  Required 2 units this admit.  No s/s of bleeding. HGB 7.4>7.7>7.7 P -Continue iron supplements -last transfusion 7/29  DM2  -Continue sliding scale insulin.  Sliding scale adjusted.  Severe protein calorie malnutrition  Hx gastric bypass  History of gastric bypass ~2017, at that time weight 240lb. Weight down to 88lb, most significant weight loss over the last 3 years. P -Continue TPN, monitor for refeeding syndrome  Best Practice (right click and "Reselect all SmartList Selections" daily)   Diet/type: TPN DVT prophylaxis: SCD and  heparin. GI prophylaxis: PPI Lines: N/A Foley:  N/A Code Status:  full code Last date of multidisciplinary goals of care discussion [7/28 full scope]  Critical care time: 35 minutes  Redmond School., MSN, APRN, AGACNP-BC [facility]  01/22/2022 , 11:05 AM  Please see [email or web address] for pager details  If no response, please call [phone] After hours, please call Elink at [phone]

## 2022-01-22 NOTE — Progress Notes (Signed)
Bilateral LE venous duplex study completed. Please see CV Proc for preliminary results.  Cary Lothrop BS, RVT 01/22/2022 10:29 AM

## 2022-01-22 NOTE — Progress Notes (Signed)
PHARMACY - TOTAL PARENTERAL NUTRITION CONSULT NOTE  Indication: Prolonged ileus  Patient Measurements: Height: 5' (152.4 cm) Weight: 44.7 kg (98 lb 8.7 oz) IBW/kg (Calculated) : 45.5 TPN AdjBW (KG): 42.2 Body mass index is 19.25 kg/m. Usual weight:  240 lbs in 2017, now 88 lbs with significant weight loss over the past 3 yrs  Assessment:  40 YOF with history of polysubstance abuse and bariatric surgery presented on 7/23 s/p intentional overdose.  CT and abd XR showed large amount of stool in the colon and an ileus. She has severe PCM with minimal nutrition since admit (TF from 7/23 until 7/25), so Pharmacy consulted to manage TPN.  Glucose / Insulin: hx DM on ?metformin PTA - hypoglycemic x2 on 7/29 prior to TPN initiation, otherwise < 180.  Used 15 units on sensitive SSI in the past 24 hrs. Electrolytes: K 4.2 (goal K >/= 4), Mag 2 (goal Mag >/= 2), Na down 134, others WNL Renal: SCr < 1, BUN WNL Hepatic: LFTs / tbili / TG WNL, albumin 1.6 Intake / Output; MIVF: UOP 0.7 ml/kg/hr, BM x 2 on 8/1 Vitamin/Mineral panel: low Vit D/A/C.  Vitamins B1/B12, copper, zinc and folate WNL. GI Imaging:  8/2 CT Abd/P: pleural effusions, B/L renal collecting system dilatation with concern of cystitis GI Surgeries / Procedures: none since TPN initiation \  Central access: PICC placed 01/17/22 TPN start date: 01/18/22  Nutritional Goals: RD Estimated Needs Total Energy Estimated Needs: 1600-1800 kcals Total Protein Estimated Needs: 80-90 g Total Fluid Estimated Needs: >/= 1.6 L  Current Nutrition:  TPN  Plan:  Continue TPN to goal rate of 70 ml/hr at 1800 to provide 86g AA, 244g CHO and 44g ILE for a total of 1607 kCal, meeting 100% of needs Electrolytes in TPN: Add Na 25 mEq/L; continue K 76mq/L, Ca 561m/L, Mg 11m67mL, Phos 68m15mL, max acetate  Add standard MVI and trace elements to TPN  Increased to moderate SSI every 4 hours - and continue to monitor Standard TPN labs on Mon and  Thurs CCM considering GI consult for decompressive flex sig - f/u F/u PO Vit A, D and C supplementation when taking orals  JessSloan LeiterarmD, BCPS, BCCCP Clinical Pharmacist Please refer to [email or web address] PWy[place]enabers 01/22/2022, 12:07 PM

## 2022-01-22 NOTE — Inpatient Diabetes Management (Signed)
Inpatient Diabetes Program Recommendations  AACE/ADA: New Consensus Statement on Inpatient Glycemic Control (2015)  Target Ranges:  Prepandial:   less than 140 mg/dL      Peak postprandial:   less than 180 mg/dL (1-2 hours)      Critically ill patients:  140 - 180 mg/dL    Latest Reference Range & Units 01/20/22 23:50 01/21/22 03:06 01/21/22 07:43 01/21/22 11:20 01/21/22 15:28 01/21/22 19:42  Glucose-Capillary 70 - 99 mg/dL 205 (H) 158 (H) 230 (H) 235 (H) 197 (H) 179 (H)  (H): Data is abnormally high  Latest Reference Range & Units 01/21/22 23:47 01/22/22 04:44  Glucose-Capillary 70 - 99 mg/dL 228 (H) 198 (H)  (H): Data is abnormally high   Home DM Meds: Metformin 500 mg BID    Novolog also listed but NOT taking      Current Orders: Novolog 0-9 units Q4H.         Getting TPN 70 cc/hr (No Insulin in the TPN) due to Prolonged Ileus.     Cortrak feeding tube placed yest--No TF ordered yet.         MD/Pharmacy--Note 3 CBGs >200 yesterday 08/01   Couple of minor elevations today   If CBGs trend upward, may consider adding Insulin to TPN   Or    Could also increase the Novolog SSI to the Moderate scale 0-15 units Q4 hours    --Will follow patient during hospitalization--  Wyn Quaker RN, MSN, [place] [facility] Coordinator Inpatient Glycemic Control Team Team Pager: [phone] (8a-5p)

## 2022-01-22 NOTE — Significant Event (Signed)
Care Contact Phone Call  Called [name]'s daughter, [name], at [phone]. They were updated on [name]'s clinical status and plan.  Redmond School., MSN, APRN, AGACNP-BC [facility]  01/22/2022 , 12:12 PM  Please see [email or web address] for pager details  If no response, please call [phone] After hours, please call Elink at [phone]

## 2022-01-22 NOTE — Progress Notes (Signed)
OT Cancellation Note  Patient Details Name: [name] MRN: [id number] DOB: [birth year]   Cancelled Treatment:    Reason Eval/Treat Not Completed: Medical issues which prohibited therapy.  OT spoke with primary RN and advised to hold for increased secretions, decreased O2 saturation, and RR.  OT will continue efforts as appropriate.    Alaria Oconnor D Kimi Kroft 01/22/2022, 8:14 AM

## 2022-01-22 NOTE — Plan of Care (Signed)
Patient remains on mechanical ventilation via trach. Patient remains on infusion of Precedex at max ordered rate for anxiety and agitation. Patient continues to endorse abdominal discomfort. Patient continues to endorse SI. 1:1 sitter remains at bedside.   Problem: Education: Goal: Ability to describe self-care measures that may prevent or decrease complications (Diabetes Survival Skills Education) will improve Outcome: Not Progressing Goal: Individualized Educational Video(s) Outcome: Not Progressing   Problem: Coping: Goal: Ability to adjust to condition or change in health will improve Outcome: Not Progressing   Problem: Fluid Volume: Goal: Ability to maintain a balanced intake and output will improve Outcome: Not Progressing   Problem: Health Behavior/Discharge Planning: Goal: Ability to identify and utilize available resources and services will improve Outcome: Not Progressing Goal: Ability to manage health-related needs will improve Outcome: Not Progressing   Problem: Metabolic: Goal: Ability to maintain appropriate glucose levels will improve Outcome: Not Progressing   Problem: Nutritional: Goal: Maintenance of adequate nutrition will improve Outcome: Not Progressing Goal: Progress toward achieving an optimal weight will improve Outcome: Not Progressing   Problem: Skin Integrity: Goal: Risk for impaired skin integrity will decrease Outcome: Not Progressing   Problem: Tissue Perfusion: Goal: Adequacy of tissue perfusion will improve Outcome: Not Progressing   Problem: Education: Goal: Knowledge of General Education information will improve Description: Including pain rating scale, medication(s)/side effects and non-pharmacologic comfort measures Outcome: Not Progressing   Problem: Health Behavior/Discharge Planning: Goal: Ability to manage health-related needs will improve Outcome: Not Progressing   Problem: Clinical Measurements: Goal: Ability to maintain  clinical measurements within normal limits will improve Outcome: Not Progressing Goal: Will remain free from infection Outcome: Not Progressing Goal: Diagnostic test results will improve Outcome: Not Progressing Goal: Respiratory complications will improve Outcome: Not Progressing Goal: Cardiovascular complication will be avoided Outcome: Not Progressing   Problem: Activity: Goal: Risk for activity intolerance will decrease Outcome: Not Progressing   Problem: Nutrition: Goal: Adequate nutrition will be maintained Outcome: Not Progressing   Problem: Coping: Goal: Level of anxiety will decrease Outcome: Not Progressing   Problem: Elimination: Goal: Will not experience complications related to bowel motility Outcome: Not Progressing Goal: Will not experience complications related to urinary retention Outcome: Not Progressing   Problem: Pain Managment: Goal: General experience of comfort will improve Outcome: Not Progressing   Problem: Safety: Goal: Ability to remain free from injury will improve Outcome: Not Progressing   Problem: Skin Integrity: Goal: Risk for impaired skin integrity will decrease Outcome: Not Progressing   Problem: Safety: Goal: Non-violent Restraint(s) Outcome: Not Progressing

## 2022-01-22 NOTE — Progress Notes (Signed)
SLP Cancellation Note  Patient Details Name: [name] MRN: [id number] DOB: 10-03-1965   Cancelled treatment:       Reason Eval/Treat Not Completed: Patient not medically ready (Pt currently on the vent. SLP will follow up on a subsequent  date.)  Deneene Tarver I. Hardin Negus, [place], [place] Office number [phone]  Horton Marshall 01/22/2022, 1:39 PM

## 2022-01-22 NOTE — Progress Notes (Signed)
Pt transported to CT and back to 2H18 without any complications.

## 2022-01-23 DIAGNOSIS — T50902A Poisoning by unspecified drugs, medicaments and biological substances, intentional self-harm, initial encounter: Secondary | ICD-10-CM | POA: Diagnosis not present

## 2022-01-23 DIAGNOSIS — K567 Ileus, unspecified: Secondary | ICD-10-CM | POA: Diagnosis not present

## 2022-01-23 DIAGNOSIS — E43 Unspecified severe protein-calorie malnutrition: Secondary | ICD-10-CM | POA: Diagnosis not present

## 2022-01-23 DIAGNOSIS — E876 Hypokalemia: Secondary | ICD-10-CM | POA: Diagnosis not present

## 2022-01-23 LAB — COMPREHENSIVE METABOLIC PANEL
ALT: 59 U/L — ABNORMAL HIGH (ref 0–44)
ALT: 63 U/L — ABNORMAL HIGH (ref 0–44)
AST: 58 U/L — ABNORMAL HIGH (ref 15–41)
AST: 51 U/L — ABNORMAL HIGH (ref 15–41)
Albumin: 1.6 g/dL — ABNORMAL LOW (ref 3.5–5.0)
Albumin: 1.6 g/dL — ABNORMAL LOW (ref 3.5–5.0)
Alkaline Phosphatase: 59 U/L (ref 38–126)
Alkaline Phosphatase: 58 U/L (ref 38–126)
Anion gap: 5 (ref 5–15)
Anion gap: 7 (ref 5–15)
BUN: 10 mg/dL (ref 6–20)
BUN: 9 mg/dL (ref 6–20)
CO2: 27 mmol/L (ref 22–32)
CO2: 27 mmol/L (ref 22–32)
Calcium: 8.1 mg/dL — ABNORMAL LOW (ref 8.9–10.3)
Calcium: 8 mg/dL — ABNORMAL LOW (ref 8.9–10.3)
Chloride: 106 mmol/L (ref 98–111)
Chloride: 104 mmol/L (ref 98–111)
Creatinine, Ser: 0.3 mg/dL — ABNORMAL LOW (ref 0.44–1.00)
Creatinine, Ser: 0.38 mg/dL — ABNORMAL LOW (ref 0.44–1.00)
GFR, Estimated: >60 mL/min (ref >60)
GFR, Estimated: >60 mL/min (ref >60)
Glucose, Bld: 394 mg/dL — ABNORMAL HIGH (ref 70–99)
Glucose, Bld: 276 mg/dL — ABNORMAL HIGH (ref 70–99)
Potassium: 5.2 mmol/L — ABNORMAL HIGH (ref 3.5–5.1)
Potassium: 4.2 mmol/L (ref 3.5–5.1)
Sodium: 138 mmol/L (ref 135–145)
Sodium: 138 mmol/L (ref 135–145)
Total Bilirubin: 0.3 mg/dL (ref 0.3–1.2)
Total Bilirubin: 0.4 mg/dL (ref 0.3–1.2)
Total Protein: 6.3 g/dL — ABNORMAL LOW (ref 6.5–8.1)
Total Protein: 6.8 g/dL (ref 6.5–8.1)

## 2022-01-23 LAB — GLUCOSE, CAPILLARY
Glucose-Capillary: 113 mg/dL — ABNORMAL HIGH (ref 70–99)
Glucose-Capillary: 174 mg/dL — ABNORMAL HIGH (ref 70–99)
Glucose-Capillary: 184 mg/dL — ABNORMAL HIGH (ref 70–99)
Glucose-Capillary: 230 mg/dL — ABNORMAL HIGH (ref 70–99)
Glucose-Capillary: 242 mg/dL — ABNORMAL HIGH (ref 70–99)
Glucose-Capillary: 313 mg/dL — ABNORMAL HIGH (ref 70–99)
Glucose-Capillary: 314 mg/dL — ABNORMAL HIGH (ref 70–99)

## 2022-01-23 LAB — PHOSPHORUS: Phosphorus: 4.4 mg/dL (ref 2.5–4.6)

## 2022-01-23 LAB — TRIGLYCERIDES: Triglycerides: 205 mg/dL — ABNORMAL HIGH (ref <150)

## 2022-01-23 LAB — MAGNESIUM: Magnesium: 2.1 mg/dL (ref 1.7–2.4)

## 2022-01-23 MED ORDER — HALOPERIDOL 5 MG PO TABS
5.0000 mg | ORAL_TABLET | Freq: Every day | ORAL | Status: DC
  Administered 2022-01-24: 5 mg
  Filled 2022-01-23: qty 1

## 2022-01-23 MED ORDER — HALOPERIDOL 5 MG PO TABS: 5.0000 mg | ORAL_TABLET | Freq: Every day | ORAL | Status: DC

## 2022-01-23 MED ORDER — LORAZEPAM 1 MG PO TABS
1.0000 mg | ORAL_TABLET | Freq: Every day | ORAL | Status: DC
  Filled 2022-01-23: qty 1

## 2022-01-23 MED ORDER — LORAZEPAM 1 MG PO TABS
1.0000 mg | ORAL_TABLET | Freq: Every day | ORAL | Status: DC
  Administered 2022-01-23: 1 mg

## 2022-01-23 MED ORDER — VITAL HIGH PROTEIN PO LIQD
1000.0000 mL | ORAL | Status: AC
  Administered 2022-01-23: 1000 mL

## 2022-01-23 MED ORDER — LORAZEPAM 1 MG PO TABS: 1.0000 mg | ORAL_TABLET | Freq: Every day | ORAL | Status: DC

## 2022-01-23 MED ORDER — INSULIN GLARGINE-YFGN 100 UNIT/ML ~~LOC~~ SOLN
8.0000 [IU] | Freq: Every day | SUBCUTANEOUS | Status: DC
  Administered 2022-01-23 – 2022-01-24 (×2): 8 [IU] via SUBCUTANEOUS
  Filled 2022-01-23 (×2): qty 0.08

## 2022-01-23 MED ORDER — HALOPERIDOL 5 MG PO TABS
5.0000 mg | ORAL_TABLET | Freq: Every day | ORAL | Status: DC
  Filled 2022-01-23: qty 1

## 2022-01-23 MED ORDER — VITAL AF 1.2 CAL PO LIQD
1000.0000 mL | ORAL | Status: DC
  Administered 2022-01-24: 1000 mL

## 2022-01-23 MED ORDER — TRAVASOL 10 % IV SOLN
INTRAVENOUS | Status: AC
  Filled 2022-01-23: qty 856.8

## 2022-01-23 NOTE — Significant Event (Signed)
Care Contact Phone Call  Called [name]'s daughter, [name], at [phone]. She was updated on [name]'s clinical status and plan.  Redmond School., MSN, APRN, AGACNP-BC [facility]  01/23/2022 , 1:20 PM  Please see [email or web address] for pager details  If no response, please call [phone] After hours, please call Elink at [phone]

## 2022-01-23 NOTE — Progress Notes (Signed)
Speech Language Pathology Treatment: [name] Speaking valve  Patient Details Name: [name] MRN: [id number] DOB: [birth year] Today's Date: 01/23/2022 Time: 1500-1520 SLP Time Calculation (min) (ACUTE ONLY): 20 min  Assessment / Plan / Recommendation Clinical Impression  Pt up in chair on ATC; SLP deflated cuff with no secretions mobilized. PMSV placed, pt able to achieve hoarse breathy phonation. Needed cues to achieve adequate breath support for volume. Did not carry over and did not seem very interested in achieving audible vocal quality. Vital signs remained stable. Pt did have some throat clearing during PO intake which repeatedly blew off valve. No overt build up of pressure behind valve. Pt may wear PMSV with full staff supervision.   HPI HPI: Pt is a 56 y.o. female admitted 01/12/22 after suicide attempt with intentional drug overdose of promethazine. ETT 7/23-7/25; reintubated for hypoxia 7/26- trach 7/30. ATC initiated on 7/31. EEG 7/31: moderate severe diffuse encephalopathy, nonspecific etiology; no seizures. Cortrak placed 7/31. PMH: Afib, polysubstance abuse, COPD, DM, neuropathy, HTN, bipolar disorder, schizophrenia, anxiety, depression, ETOH abuse.      SLP Plan  MBS      Recommendations for follow up therapy are one component of a multi-disciplinary discharge planning process, led by the attending physician.  Recommendations may be updated based on patient status, additional functional criteria and insurance authorization.    Recommendations  Diet recommendations: Other(comment) (ice chips unrestricted)      Patient may use Passy-Muir Speech Valve: Intermittently with supervision PMSV Supervision: Full         Oral Care Recommendations: Oral care BID Follow Up Recommendations: Skilled nursing-short term rehab (<3 hours/day) Assistance recommended at discharge: Frequent or constant Supervision/Assistance SLP Visit Diagnosis: Dysphagia, unspecified  (R13.10);[place] (R49.1) Plan: MBS           Lucious Zou, Katherene Ponto  01/23/2022, 3:35 PM

## 2022-01-23 NOTE — Progress Notes (Signed)
PHARMACY - TOTAL PARENTERAL NUTRITION CONSULT NOTE  Indication: Prolonged ileus  Patient Measurements: Height: 5' (152.4 cm) Weight: 42.8 kg (94 lb 5.7 oz) IBW/kg (Calculated) : 45.5 TPN AdjBW (KG): 42.2 Body mass index is 18.43 kg/m. Usual weight:  240 lbs in 2017, now 88 lbs with significant weight loss over the past 3 yrs  Assessment:  43 YOF with history of polysubstance abuse and bariatric surgery presented on 7/23 s/p intentional overdose.  CT and abd XR showed large amount of stool in the colon and an ileus. She has severe PCM with minimal nutrition since admit (TF from 7/23 until 7/25), so Pharmacy consulted to manage TPN.  Glucose / Insulin: hx DM on ?metformin PTA - hypoglycemic x2 on 7/29 prior to TPN initiation, now CBGs increased 180-313.  Used 27 units on sensitive SSI in the past 24 hrs. Electrolytes: *Initial labs 8/3 contaminated with TPN - repeated: K 4.2 - stable (goal K >/= 4), Mag 2.1 (goal Mag >/= 2), Na 134>> 138, others WNL Renal: SCr 0.38 -stable, BUN 9-stable Hepatic: LFTs mildly elevated/rising, tbili/TG WNL, albumin 1.6 Intake / Output; MIVF: UOP difficult to quantify (x8 unmeasured occurances), BM x 4 this AM on Lactulose 30 TID, no overt volume overload noted on exam Vitamin/Mineral panel: low Vit D/A/C.  Vitamins B1/B12, copper, zinc and folate WNL. GI Imaging:  8/2 CT Abd/P: pleural effusions, B/L renal collecting system dilatation with concern of cystitis GI Surgeries / Procedures: none since TPN initiation   Central access: PICC placed 01/17/22 TPN start date: 01/18/22  Nutritional Goals: RD Estimated Needs Total Energy Estimated Needs: 1600-1800 kcals Total Protein Estimated Needs: 80-90 g Total Fluid Estimated Needs: >/= 1.6 L  Current Nutrition:  TPN and Tube feeding - Trickle only  Plan:  Continue TPN to goal rate of 70 ml/hr at 1800 to provide 86g AA, 244g CHO and 44g ILE for a total of 1607 kCal, meeting 100% of needs Electrolytes in TPN:  continue Na 25 mEq/L, K 37mq/L, Ca 521m/L, Mg 3m2mL, Phos 33m21mL, max acetate  Add standard MVI and trace elements to TPN  Continue moderate SSI every 4 hours, Primary team added Semglee 8 units daily -- will NOT put any insulin in TPN at this time and monitor Standard TPN labs on Mon and Thurs CCM considering GI consult for decompressive flex sig - f/u F/u PO Vit A, D and C supplementation when taking orals Will repeat labs and TG in AM, monitor ability to advance tube feeds   JessSloan LeiterarmD, BCPS, BCCCP Clinical Pharmacist Please refer to [facility] PSewall[facility] Point[facility] 01/23/2022, 7:33 AM

## 2022-01-23 NOTE — Progress Notes (Signed)
Nutrition Follow-up  DOCUMENTATION CODES:   Underweight, Severe malnutrition in context of chronic illness  INTERVENTION:   RD discussed concerns regarding abdomen, Cortrak tip location and EN with PCCM. CT reviewed with PCCM. Plan to continue TF for now given pt tolerated large volume of contrast yesterday, +BM. Hold TF for any worsening abdominal pain, increasing abdominal distention, vomiting, etc.   Continue TPN to meet 100% of nutritional needs until pt demonstrating tolerance of TF with titration to goal rate.   Tube Feeding via Cortrak:  Transition to Vital AF 1.2 at 20 ml/hr once current bottle of Vital High Protein runs out  Goal: Vital AF 1.2 at 60 ml/hr This provides 1728 kcals, 108 g of protein and 1166 mL of free water  Continue B-12 injections; Receiving standard MVI and trace minerals via TPN   Add Vitamin A and D supplementation once able to take po   NUTRITION DIAGNOSIS:   Severe Malnutrition related to chronic illness (hx gastric bypass, COPD, polysubtance abuse) as evidenced by severe fat depletion, severe muscle depletion.  Being addressed via TPN  GOAL:   Patient will meet greater than or equal to 90% of their needs  Progress  MONITOR:   TF tolerance, Vent status, Labs, Weight trends  REASON FOR ASSESSMENT:   Consult, Ventilator Enteral/tube feeding initiation and management  ASSESSMENT:   56 yo female admitted with acute encephalopathy in setting of intentional OD with SI, intubated for airway protection. PMH includes COPD, HTN, DM, schizoaffective disorder, depression. Pt with hx of gastric bypass in 2017, sig weight loss  7/23 Admitted, Intubated, TF started via Protocol 7/25 Extubated, TF held, Cortrak placed, kinked and could not use, re-intubated, TF not started 7/26 Cortrak TF adjusted by RD, flushed, TF remains on hold 7/27 CT abdomen: Large amount of stool in the ascending colon.Moderate amount of stool in the descending colon. Gaseous  distension of the transverse colon. Mild distension of the small bowel. 7/28 TPN consul placed 7/29 TPN initiated 7/30 Trach placed 7/31 Cortrak placed 8/02 CT abdomen/pelvis without acute intraabdominal process per report.  8/03 Trickle TF ordered by PCCM  Pt sitting up in chair today, tolerating TC. Pt mouthing that she wants water on visit today.  Pt being allowed ice chips only. Remains on precedex, 1:1 sitter, remains IVC  TPN continues at goal rate of 70 ml/hr   Cortrak in place. CT abdomen from 8/2 reporting that tip is gastric (?in gastric pouch post bariatric surgery) whereas abd xray on on 7/31 indicating likely in proximal SB. SB placement is preferred in setting of gastric bypass, discussed with PCCM.   Pt continues with abdominal distention, abdominal pain especially when abdomen touched. +BMs. Pt denies nausea and denies gas  CT abdomen and abd concerns reviewed with PCCM; likely still has some degree of ileus but given pt tolerated large volume of contrast administration via Cortrak yesterday and is having BMs, plan to trial trickle.   Pt continues with extensive bowel regimen, continuing for now per PCCM.   SLP following, pt remains NPO, PMV use while supervised. Plan for modified barium swallow  Current wt 42.8, admit wt 42.8 kg  Micronutrient Panel: CRP: 9.1 (H) Copper:129 (wdl) Thiamine: 107.8 (wdl) Vitamin A: 14.7 (L) Vitamin C: 0.3 (L) Vitamin D: 10.32 (L) Vitamin B12: 193 (L-normal)-likely low once inflammation taken into account Zinc: 48 (wdl)   Labs: CBGs 174-314 Meds: lactulose, simethicone, ss novolog, semglee, B12, dulcolax, colace    Diet Order:   Diet Order  Diet NPO time specified  Diet effective now                   EDUCATION NEEDS:   Not appropriate for education at this time  Skin:  Skin Assessment: Reviewed RN Assessment  Last BM:  8/3  Height:   Ht Readings from Last 1 Encounters:  01/12/22 5' (1.524 m)     Weight:   Wt Readings from Last 1 Encounters:  01/23/22 42.8 kg    BMI:  Body mass index is 18.43 kg/m.  Estimated Nutritional Needs:   Kcal:  1600-1800 kcals  Protein:  100-115 g  Fluid:  >/= 1.6 L   Kerman Passey MS, RDN, LDN, CNSC Registered Dietitian 3 Clinical Nutrition RD Pager and On-Call Pager Number Located in [place]

## 2022-01-23 NOTE — Progress Notes (Addendum)
NAME:  [name], MRN:  [id number], DOB:  05-Dec-1965, LOS: 55 ADMISSION DATE:  01/12/2022, CONSULTATION DATE:  01/12/21 REFERRING MD:  Sedonia Small - EM, CHIEF COMPLAINT:  Intentional Overdose    History of Present Illness:  56 yo F PMH schizoaffective disorder (bipolar type), Depression, prior SI, polysubstance use disorder, COPD, HTN, DM presented to the ED following suicide attempt. Patient had been drinking, while with friends stated that she was going to kill herself and was witnessed ingesting a bottle of promethazine.   In transport with EMS became more somnolent. Arrived to ED with GCS 3 and was intubated in this setting. EM discussed case with poison control who recommended supportive care  PCCM consulted for admission in this setting.  Pertinent Medical History:  Schizoaffective disorder, bipolar type Depression Polysubstance abuse  COPD HTN DM    Significant Hospital Events: Including procedures, antibiotic start and stop dates in addition to other pertinent events   7/23 ED after etoh consumption and intentional promethazine overdose. Intubated. Admit to ICU  7/24 Tachycardic to 130s overnight, also with episode of whole body stiffening, ?dystonia vs. seizure. EEG with irritability, LTM initiated. Neuro consulted. 7/25 Extubated, intermittently somnolent/agitated, not managing secretions well. O2 sats stable on [place]. Cortrak difficult to flush. 7/26 Reintubated early AM for hypoxia, airway protection. Unasyn initiated for ?aspiration PNA. LTM EEG discontinued. MRI pending. 7/27 MRI no acute changes. Wakes up follows commands on .7 dex  7/28 off NE  01/19/2022>> Trach after goals of care discussion with daughter 7/31 Transitioned to ATC.  8/2 CT abdomen: Stool in right colon.  Normal appendix.  Placed gastric bypass surgery.  Nasogastric tube in stomach.  No evidence of wall thickening or obstruction and bowel.  Questionable cystitis.  Bibasilar pleural effusions.  Ceftriaxone  started.  Interim History / Subjective:  No acute events overnight  Tmax 99.9  8 unmeasured urine outputs, 3 unmeasured stools  Subjective: Endorses continued abdominal pain, asking when she can drink  Precedex at 1  On vent from 2000 to 0400   Objective:  Blood pressure 132/75, pulse 92, temperature 99.2 F (37.3 C), temperature source Oral, resp. rate (!) 27, height 5' (1.524 m), weight 42.8 kg, last menstrual period 01/08/2011, SpO2 98 %.    Vent Mode: PRVC FiO2 (%):  [40 %] 40 % Set Rate:  [18 bmp] 18 bmp Vt Set:  [360 mL] 360 mL PEEP:  [5 cmH20] 5 cmH20 Plateau Pressure:  [2 cmH20-21 cmH20] 19 cmH20   Intake/Output Summary (Last 24 hours) at 01/23/2022 1008 Last data filed at 01/23/2022 0000 Gross per 24 hour  Intake 1354.8 ml  Output 1 ml  Net 1353.8 ml   Filed Weights   01/21/22 0344 01/22/22 0400 01/23/22 0500  Weight: 44.4 kg 44.7 kg 42.8 kg   Physical Examination: General: In bed, NAD, appears comfortable HEENT: MM pink/moist, anicteric, atraumatic Neuro: RASS 0, PERRL 88m, GCS 11t, trach midline CV: S1S2, NSR, no m/r/g appreciated PULM:  air movement in lobes, trachea midline, chest expansion symmetric GI: soft, bsx4 active, generalized tenderness   Extremities: warm/dry, no pretibial edema, capillary refill less than 3 seconds  Skin:  no rashes or lesions noted  Labs CT abdomen: Stool in right colon.  Normal appendix.  Placed gastric bypass surgery.  Nasogastric tube in stomach.  No evidence of wall thickening or obstruction and bowel.  Questionable cystitis.  Bibasilar pleural effusions. Blood glucose 196-276 Initial labs tainted by TPN Creatinine 0.38 AST 58 > 51 ALT 59 >  63 Albumin 1.6 UA: Positive for leukocytes, many bacteria, WBC greater than 50 Lower extremity Dopplers: Negative  Resolved Hospital Problem List:   Hypotension, suspect sedation related    Assessment & Plan:  Acute toxic metabolic encephalopathy, initially in setting of  intentional overdose  Hx seizure  -MRI brain 7/27 no acute abnormalities  P -Continue Keppra 500 mg twice daily -Continue Precedex.  Goal RASS 0 to -1.  Appreciate psychiatry recommendations for anxiety/agitation. -Continue delirium precautions  SI with intentional promethazine overdose Schizoaffective disorder -- Bipolar type MDD  Polysubstance use disorder  Phenobarb taper completed. On Abilify at home? P -Psych to see patient on 8/3.  Discussed with Henrene Pastor NP. -Continue IVC  Acute respiratory failure with hypoxia initially in setting of overdose, then 2/2 aspiration - Extubated 7/25AM, unable to manage secretions throughout the day due to varying agitation/somnolence and inability to follow commands/redirect, reintubated 7/26 - -think her ileus contributed to reintubation Aspiration Event Hx COPD without acute exacerbation  Tobacco use disorder  Course of Unasyn completed.  Placed on ventilator from 8 PM to 4 AM for unknown reason. -Follow fever W BC curve -Goal SPO2 90 to 96% wean oxygen to goal -Pulmonary toilet as able.  Deep breathing and coughing. Up to chair as able  ?Cystitis vs UTI Leukocytosis WBC 9.7 > 12.29 > 15.5.  Lower extremity Dopplers negative. UA: Positive for leukocytes, many bacteria, WBC greater than 50. CT abdomen concerning for cystitis. -Follow fever WBC curve -Continue ceftriaxone 1 g IV daily.  Day 2 of 5.  Abdominal pain Ileus - Resolved Received relistor, subQ neostigmine, numerous agents from above and below.  KUB with improvement per radiology. 3 stools past 24 hours. ABD  CT with stool in right colon, no obstruction or wall thickening.  Unclear cause of abdominal pain. P -Continue TPN -Start trickle tube feedings -Continue lactulose 30 mg 3 times daily per tube  Hx HTN  On dilt for this at home P -Continue holding home antihypertensives   Iron deficiency anemia Notable hx of allergic rxn to iron transfusion previously.  Required 2 units  this admit.  No s/s of bleeding. HGB 7.4>7.7>7.7 P -Continue iron supplements -last transfusion 7/29 -Monitor on a.m. CBC  DM2  Blood glucose 196-276 -Blood Glucose goal 140-180. -SSI -Start glargine 8U daily  Severe protein calorie malnutrition  Hx gastric bypass  History of gastric bypass ~2017, at that time weight 240lb. Weight down to 88lb, most significant weight loss over the last 3 years. P -Continue TPN, start trickle tube feeds, monitor for refeeding syndrome.   Best Practice (right click and "Reselect all SmartList Selections" daily)   Diet/type: TPN DVT prophylaxis: SCD and heparin. GI prophylaxis: PPI Lines: N/A Foley:  N/A Code Status:  full code Last date of multidisciplinary goals of care discussion [spoke with daughter 8/2]  Critical care time:  34 minutes  Redmond School., MSN, APRN, AGACNP-BC [facility]  01/23/2022 , 10:08 AM  Please see [email or web address] for pager details  If no response, please call [phone] After hours, please call Elink at [phone]

## 2022-01-23 NOTE — Consult Note (Signed)
  56 yo F PMH schizoaffective disorder (bipolar type), Depression, prior SI, polysubstance use disorder, COPD, HTN, DM presented to the ED following suicide attempt. Patient had been drinking, while with friends stated that she was going to kill herself and was witnessed ingesting a bottle of promethazine.   In transport with EMS became more somnolent. Arrived to ED with GCS 3 and was intubated in this setting. EM discussed case with poison control who recommended supportive care   Psychiatry consulted due to suicide attempt by overdose.  Patient was recently trached 07/30, and currently trialing a trach collar  this morning. Patient is observed to be seating upright in bedside chair, legs crossed. She is observed to be smiling at this provider, and motions for me to enter the room.  She immediately asks for some ice chips or water. She is able to be directed, and discussed with patient timing restraints for ice chip and swallowing precautions.     Patient is seen and assessed face to face by this nurse practitioner, chart reviewed, sitter present at bedside.  Patient is alert and oriented x4, calm and cooperative, and pleasant.She did agree to participate in the psychiatric evaluation, after ice chips were offered.  Patient has limited recollection of events leading up to her admission, however reports she was trying to get high. Explained to patient she attempted suicide in which she denied. .  Patient continues to minimize her suicide attempt throughout the brief psychiatric evaluation.  She further denies any acute psychiatric symptoms, to include recent overnight agitation, aggression, and confusion that results in her being placed on the vent. Patient denies all symptoms of depression to include sadness, insomnia, poor appetite /increased appetite, weight gain /weight loss, anxiety, suicidal thoughts.  She further denies any stressors, triggers that could have resulted in suicide attempt.    On today's  evaluation she is able to hold a linear conversation through use of mouthing of words, finger signs, and lip reading.  This is much better progress than she has made the days before.   Patient does have a support system to include her daughters, brother, and parents who are all supportive in her decision for complete cessation of illicit substances and inpatient recommendations.  She is NPO (sips and chips..2cubes of ice every 30 minutes).  She denies any suicidal ideations, homicidal ideations, and or auditory or visual hallucinations.  She denies any side effects from her medications.  All questions comments and concerns were addressed by both patient and daughter.    Due to patient's suicide attempt of high lethality, previous psychiatric history, and additional risk factors that place patient at high risk for suicide completion,she continues to meet criteria for inpatient psychiatry admission.  Patient continues to be a danger to self, as she minimizes her suicide attempt and therefore meets criteria for IVC.  -WIll start Haldol '5mg'$  po qhs, '1mg'$  Ativan po qhs for agitation, anxiety, and delirium. EKG obtained 08/02 , QTc 401. -Continue IVC papers as she remains a risk to self.  -Continue delirium precautions as directed.

## 2022-01-23 NOTE — Evaluation (Signed)
Clinical/Bedside Swallow Evaluation Patient Details  Name: ANNABELLA ELFORD MRN: [id number] Date of Birth: Nov 15, 1965  Today's Date: 01/23/2022 Time: SLP Start Time (ACUTE ONLY): 1500 SLP Stop Time (ACUTE ONLY): 1520 SLP Time Calculation (min) (ACUTE ONLY): 20 min  Past Medical History:  Past Medical History:  Diagnosis Date   Abdominal pain    Accidental drug overdose April 2013   Anxiety    Atrial fibrillation ([place]) 09/29/11   converted spontaneously   Chronic back pain    Chronic knee pain    Chronic nausea    Chronic pain    COPD (chronic obstructive pulmonary disease) ([place])    Depression    Diabetes mellitus    states her doctor took her off all DM meds in past month   Diabetic neuropathy ([place])    Dyspnea    with exertion    GERD (gastroesophageal reflux disease)    Headache(784.0)    migraines    HTN (hypertension)    not on meds since in a year    Hyperlipidemia    Hypothyroidism    not on meds in a while    Mental disorder    Bipolar and schizophrenic   Requires supplemental oxygen    as needed per patient    Schizophrenia ([place])    Schizophrenia, acute ([place]) 11/13/2017   Tobacco abuse    Past Surgical History:  Past Surgical History:  Procedure Laterality Date   ABDOMINAL HYSTERECTOMY     BLADDER SUSPENSION  03/04/2011   Procedure: [facility] PROCEDURE;  Surgeon: Elayne Snare MacDiarmid;  Location: [facility];  Service: Urology;  Laterality: N/A;   CYSTOCELE REPAIR  03/04/2011   Procedure: ANTERIOR REPAIR (CYSTOCELE);  Surgeon: Reece Packer;  Location: [facility];  Service: Urology;  Laterality: N/A;   CYSTOSCOPY  03/04/2011   Procedure: CYSTOSCOPY;  Surgeon: Elayne Snare MacDiarmid;  Location: [facility];  Service: Urology;  Laterality: N/A;   ESOPHAGOGASTRODUODENOSCOPY (EGD) WITH PROPOFOL N/A 05/12/2017   Procedure: ESOPHAGOGASTRODUODENOSCOPY (EGD) WITH PROPOFOL;  Surgeon: Alphonsa Overall, MD;  Location: [facility];  Service: General;  Laterality: N/A;   GASTRIC ROUX-EN-Y N/A  03/25/2016   Procedure: LAPAROSCOPIC ROUX-EN-Y GASTRIC BYPASS WITH UPPER ENDOSCOPY;  Surgeon: Excell Seltzer, MD;  Location: [facility];  Service: General;  Laterality: N/A;   KNEE SURGERY     LAPAROSCOPIC ASSISTED VAGINAL HYSTERECTOMY  03/04/2011   Procedure: LAPAROSCOPIC ASSISTED VAGINAL HYSTERECTOMY;  Surgeon: Cyril Mourning, MD;  Location: [facility];  Service: Gynecology;  Laterality: N/A;   HPI:  Pt is a 56 y.o. female admitted 01/12/22 after suicide attempt with intentional drug overdose of promethazine. ETT 7/23-7/25; reintubated for hypoxia 7/26- trach 7/30. ATC initiated on 7/31. EEG 7/31: moderate severe diffuse encephalopathy, nonspecific etiology; no seizures. Cortrak placed 7/31. PMH: Afib, polysubstance abuse, COPD, DM, neuropathy, HTN, bipolar disorder, schizophrenia, anxiety, depression, ETOH abuse.    Assessment / Plan / Recommendation  Clinical Impression  Pt demonstrates good potential for swallowing ability. She is alert, able to eat carefully and slowly with small bites and sips with PMSV in place. Pt consumed 8 oz of water and then 4 oz pf puree, but did have some delayed throat clearing after puree. Given dysphonia and trach and potential for silent aspiration or pharyngeal residue, recommend instrumental assessment prior to diet initiation. Pt may have ice chips without quantity restriction until MBS completed. SLP Visit Diagnosis: Dysphagia, unspecified (R13.10)    Aspiration Risk  Moderate aspiration risk    Diet Recommendation Ice chips PRN  after oral care        Other  Recommendations Oral Care Recommendations: Oral care BID    Recommendations for follow up therapy are one component of a multi-disciplinary discharge planning process, led by the attending physician.  Recommendations may be updated based on patient status, additional functional criteria and insurance authorization.  Follow up Recommendations Skilled nursing-short term rehab (<3 hours/day)       Assistance Recommended at Discharge Frequent or constant Supervision/Assistance  Functional Status Assessment Patient has had a recent decline in their functional status and demonstrates the ability to make significant improvements in function in a reasonable and predictable amount of time.  Frequency and Duration min 2x/week  2 weeks       Prognosis        Swallow Study   General HPI: Pt is a 56 y.o. female admitted 01/12/22 after suicide attempt with intentional drug overdose of promethazine. ETT 7/23-7/25; reintubated for hypoxia 7/26- trach 7/30. ATC initiated on 7/31. EEG 7/31: moderate severe diffuse encephalopathy, nonspecific etiology; no seizures. Cortrak placed 7/31. PMH: Afib, polysubstance abuse, COPD, DM, neuropathy, HTN, bipolar disorder, schizophrenia, anxiety, depression, ETOH abuse. Type of Study: Bedside Swallow Evaluation Diet Prior to this Study: NPO;NG Tube Temperature Spikes Noted: No Respiratory Status: Trach;Trach Collar Trach Size and Type: #6;Cuff;Deflated;With PMSV in place History of Recent Intubation: Yes Length of Intubations (days): 6 days Date extubated: 01/19/22 Behavior/Cognition: Alert;Cooperative Oral Cavity Assessment: Within Functional Limits Oral Care Completed by SLP: No Oral Cavity - Dentition: Edentulous Vision: Functional for self-feeding Self-Feeding Abilities: Able to feed self Patient Positioning: Upright in chair Baseline Vocal Quality: Hoarse;Breathy;Low vocal intensity Volitional Cough: Strong Volitional Swallow: Able to elicit    Oral/Motor/Sensory Function Overall Oral Motor/Sensory Function: Within functional limits   Ice Chips Ice chips: Within functional limits Presentation: Spoon   Thin Liquid Thin Liquid: Within functional limits    Nectar Thick Nectar Thick Liquid: Not tested   Honey Thick Honey Thick Liquid: Not tested   Puree Puree: Impaired Presentation: Spoon Pharyngeal Phase Impairments: Throat Clearing - Delayed    Solid     Solid: Not tested      Baldemar Dady, Katherene Ponto 01/23/2022,3:43 PM

## 2022-01-24 ENCOUNTER — Inpatient Hospital Stay (HOSPITAL_COMMUNITY): Payer: Medicare Other

## 2022-01-24 DIAGNOSIS — T50902D Poisoning by unspecified drugs, medicaments and biological substances, intentional self-harm, subsequent encounter: Secondary | ICD-10-CM | POA: Diagnosis not present

## 2022-01-24 DIAGNOSIS — T50902A Poisoning by unspecified drugs, medicaments and biological substances, intentional self-harm, initial encounter: Secondary | ICD-10-CM | POA: Diagnosis not present

## 2022-01-24 DIAGNOSIS — E876 Hypokalemia: Secondary | ICD-10-CM | POA: Diagnosis not present

## 2022-01-24 DIAGNOSIS — K567 Ileus, unspecified: Secondary | ICD-10-CM | POA: Diagnosis not present

## 2022-01-24 LAB — CBC WITH DIFFERENTIAL/PLATELET
Abs Immature Granulocytes: 0.07 10*3/uL (ref 0.00–0.07)
Basophils Absolute: 0 10*3/uL (ref 0.0–0.1)
Basophils Relative: 0 %
Eosinophils Absolute: 0.1 10*3/uL (ref 0.0–0.5)
Eosinophils Relative: 0 %
HCT: 26.1 % — ABNORMAL LOW (ref 36.0–46.0)
Hemoglobin: 7.5 g/dL — ABNORMAL LOW (ref 12.0–15.0)
Immature Granulocytes: 1 %
Lymphocytes Relative: 8 %
Lymphs Abs: 1.1 10*3/uL (ref 0.7–4.0)
MCH: 21.1 pg — ABNORMAL LOW (ref 26.0–34.0)
MCHC: 28.7 g/dL — ABNORMAL LOW (ref 30.0–36.0)
MCV: 73.3 fL — ABNORMAL LOW (ref 80.0–100.0)
Monocytes Absolute: 0.7 10*3/uL (ref 0.1–1.0)
Monocytes Relative: 5 %
Neutro Abs: 12.5 10*3/uL — ABNORMAL HIGH (ref 1.7–7.7)
Neutrophils Relative %: 86 %
Platelets: 296 10*3/uL (ref 150–400)
RBC: 3.56 MIL/uL — ABNORMAL LOW (ref 3.87–5.11)
RDW: 29.6 % — ABNORMAL HIGH (ref 11.5–15.5)
WBC: 14.5 10*3/uL — ABNORMAL HIGH (ref 4.0–10.5)
nRBC: 0 % (ref 0.0–0.2)

## 2022-01-24 LAB — BASIC METABOLIC PANEL
Anion gap: 7 (ref 5–15)
BUN: 10 mg/dL (ref 6–20)
CO2: 29 mmol/L (ref 22–32)
Calcium: 8.4 mg/dL — ABNORMAL LOW (ref 8.9–10.3)
Chloride: 100 mmol/L (ref 98–111)
Creatinine, Ser: <0.3 mg/dL — ABNORMAL LOW (ref 0.44–1.00)
Glucose, Bld: 211 mg/dL — ABNORMAL HIGH (ref 70–99)
Potassium: 3.7 mmol/L (ref 3.5–5.1)
Sodium: 136 mmol/L (ref 135–145)

## 2022-01-24 LAB — GLUCOSE, CAPILLARY
Glucose-Capillary: 226 mg/dL — ABNORMAL HIGH (ref 70–99)
Glucose-Capillary: 179 mg/dL — ABNORMAL HIGH (ref 70–99)
Glucose-Capillary: 301 mg/dL — ABNORMAL HIGH (ref 70–99)
Glucose-Capillary: 325 mg/dL — ABNORMAL HIGH (ref 70–99)
Glucose-Capillary: 201 mg/dL — ABNORMAL HIGH (ref 70–99)
Glucose-Capillary: 137 mg/dL — ABNORMAL HIGH (ref 70–99)
Glucose-Capillary: 153 mg/dL — ABNORMAL HIGH (ref 70–99)

## 2022-01-24 LAB — PHOSPHORUS: Phosphorus: 3.5 mg/dL (ref 2.5–4.6)

## 2022-01-24 LAB — LACTIC ACID, PLASMA: Lactic Acid, Venous: 2.8 mmol/L (ref 0.5–1.9)

## 2022-01-24 LAB — MAGNESIUM: Magnesium: 1.9 mg/dL (ref 1.7–2.4)

## 2022-01-24 MED ORDER — VITAMIN A 3 MG (10000 UNIT) PO CAPS
50000.0000 [IU] | ORAL_CAPSULE | Freq: Every day | ORAL | Status: DC
  Administered 2022-01-25 – 2022-01-26 (×2): 50000 [IU] via ORAL
  Filled 2022-01-24 (×3): qty 5

## 2022-01-24 MED ORDER — MAGNESIUM SULFATE 2 GM/50ML IV SOLN
2.0000 g | Freq: Once | INTRAVENOUS | Status: AC
  Administered 2022-01-24: 2 g via INTRAVENOUS
  Filled 2022-01-24: qty 50

## 2022-01-24 MED ORDER — LORAZEPAM 2 MG/ML IJ SOLN
1.0000 mg | Freq: Two times a day (BID) | INTRAMUSCULAR | Status: DC
  Administered 2022-01-24 – 2022-02-12 (×38): 1 mg via INTRAVENOUS
  Filled 2022-01-24 (×39): qty 1

## 2022-01-24 MED ORDER — POTASSIUM CHLORIDE 20 MEQ PO PACK
40.0000 meq | PACK | Freq: Once | ORAL | Status: AC
  Administered 2022-01-24: 40 meq
  Filled 2022-01-24: qty 2

## 2022-01-24 MED ORDER — MIDAZOLAM HCL 2 MG/2ML IJ SOLN
INTRAMUSCULAR | Status: AC
  Administered 2022-01-24: 2 mg via INTRAVENOUS
  Filled 2022-01-24: qty 2

## 2022-01-24 MED ORDER — ACETAMINOPHEN 10 MG/ML IV SOLN: 1000.0000 mg | Freq: Once | INTRAVENOUS | Status: AC | PRN

## 2022-01-24 MED ORDER — MIDAZOLAM HCL 2 MG/2ML IJ SOLN: 2.0000 mg | Freq: Once | INTRAMUSCULAR | Status: AC

## 2022-01-24 MED ORDER — HALOPERIDOL 1 MG PO TABS
2.0000 mg | ORAL_TABLET | Freq: Two times a day (BID) | ORAL | Status: DC
  Filled 2022-01-24: qty 2

## 2022-01-24 MED ORDER — INSULIN GLARGINE-YFGN 100 UNIT/ML ~~LOC~~ SOLN
4.0000 [IU] | Freq: Once | SUBCUTANEOUS | Status: AC
Start: 2022-01-24 — End: 2022-01-24
  Administered 2022-01-24: 4 [IU] via SUBCUTANEOUS
  Filled 2022-01-24: qty 0.04

## 2022-01-24 MED ORDER — TRAVASOL 10 % IV SOLN
INTRAVENOUS | Status: AC
  Filled 2022-01-24: qty 856.8

## 2022-01-24 MED ORDER — VITAMIN D (ERGOCALCIFEROL) 1.25 MG (50000 UNIT) PO CAPS
50000.0000 [IU] | ORAL_CAPSULE | ORAL | Status: DC
  Filled 2022-01-24: qty 1

## 2022-01-24 MED ORDER — HALOPERIDOL LACTATE 5 MG/ML IJ SOLN
2.0000 mg | Freq: Two times a day (BID) | INTRAMUSCULAR | Status: DC
  Administered 2022-01-24 – 2022-02-01 (×16): 2 mg via INTRAVENOUS
  Filled 2022-01-24 (×16): qty 1

## 2022-01-24 MED ORDER — VITAMIN D (ERGOCALCIFEROL) 1.25 MG (50000 UNIT) PO CAPS
50000.0000 [IU] | ORAL_CAPSULE | ORAL | Status: DC
  Administered 2022-01-25: 50000 [IU] via ORAL
  Filled 2022-01-24: qty 1

## 2022-01-24 MED ORDER — INSULIN GLARGINE-YFGN 100 UNIT/ML ~~LOC~~ SOLN: 10.0000 [IU] | Freq: Every day | SUBCUTANEOUS | Status: DC

## 2022-01-24 MED ORDER — IPRATROPIUM-ALBUTEROL 0.5-2.5 (3) MG/3ML IN SOLN
3.0000 mL | Freq: Two times a day (BID) | RESPIRATORY_TRACT | Status: DC
  Administered 2022-01-24 – 2022-01-25 (×3): 3 mL via RESPIRATORY_TRACT
  Filled 2022-01-24 (×3): qty 3

## 2022-01-24 MED ORDER — FUROSEMIDE 10 MG/ML IJ SOLN
40.0000 mg | Freq: Two times a day (BID) | INTRAMUSCULAR | Status: AC
  Administered 2022-01-24 (×2): 40 mg via INTRAVENOUS
  Filled 2022-01-24 (×2): qty 4

## 2022-01-24 MED ORDER — LORAZEPAM 1 MG PO TABS
1.0000 mg | ORAL_TABLET | Freq: Two times a day (BID) | ORAL | Status: DC
Start: 2022-01-24 — End: 2022-01-24

## 2022-01-24 MED ORDER — INSULIN GLARGINE-YFGN 100 UNIT/ML ~~LOC~~ SOLN
12.0000 [IU] | Freq: Every day | SUBCUTANEOUS | Status: DC
  Filled 2022-01-24: qty 0.12

## 2022-01-24 NOTE — Progress Notes (Signed)
Nutrition Follow-up  DOCUMENTATION CODES:   Underweight, Severe malnutrition in context of chronic illness  INTERVENTION:   CALORIE COUNT to assess nutritional intake post diet advancement  Magic cup TID with meals, each supplement provides 290 kcal and 9 grams of protein  Continue B-12 injections Add Vitamin D 50,000 units every 7 days x 8 weeks Add Vitamin A 50,000 units daily x 2 weeks  Continue TPN to meet 100% of nutritional needs until pt demonstrating adequate po intake  Recommend holding lactulose for now given pt with multiple liquid BMs today, lactulose can cause gas and abdominal discomfort   NUTRITION DIAGNOSIS:   Severe Malnutrition related to chronic illness (hx gastric bypass, COPD, polysubtance abuse) as evidenced by severe fat depletion, severe muscle depletion.  Being addressed  GOAL:   Patient will meet greater than or equal to 90% of their needs  Progressing  MONITOR:   TF tolerance, Vent status, Labs, Weight trends  REASON FOR ASSESSMENT:   Consult, Ventilator Enteral/tube feeding initiation and management  ASSESSMENT:   56 yo female admitted with acute encephalopathy in setting of intentional OD with SI, intubated for airway protection. PMH includes COPD, HTN, DM, schizoaffective disorder, depression. Pt with hx of gastric bypass in 2017, sig weight loss   7/23 Admitted, Intubated, TF started via Protocol 7/25 Extubated, TF held, Cortrak placed, kinked and could not use, re-intubated, TF not started 7/26 Cortrak TF adjusted by RD, flushed, TF remains on hold 7/27 CT abdomen: Large amount of stool in the ascending colon.Moderate amount of stool in the descending colon. Gaseous distension of the transverse colon. Mild distension of the small bowel. 7/28 TPN consul placed 7/29 TPN initiated 7/30 Trach placed 7/31 Cortrak placed 8/02 CT abdomen/pelvis without acute intraabdominal process per report.  8/03 Trickle TF ordered by [facility] 8/04  Cortrak removed by patient. Diet advanced to Dysphagia 2, Nectar Thick post MBS  Pt remains IVC, meets inpatient psychiatric criteria  Pt on TC this AM but required vent support again overnight.   Trickle TF infusing via Cortrak upon assessment today. Pt continues with abdominal pain but no vomiting.   MBS performed today and diet advanced to Dysphagia 2, Nectar Thick Diet. Pt has been eager to eat for days but also note that pt told Psychiatry that she did not have an appetite due to abdominal pain.    Prior to [facility], pt actually removed her Cortrak tube. Pt then ate about 50% of lunch tray per RN. Decision made to leave Cortrak out for now, preform calorie count over the weekend and reassess.  Labs: reviewed Meds: lasix, ss novolog, B12, colace, semglee  Diet Order:   Diet Order             DIET DYS 2 Room service appropriate? Yes with Assist; Fluid consistency: Nectar Thick  Diet effective now                   EDUCATION NEEDS:   Not appropriate for education at this time  Skin:  Skin Assessment: Reviewed RN Assessment  Last BM:  8/4  Height:   Ht Readings from Last 1 Encounters:  01/12/22 5' (1.524 m)    Weight:   Wt Readings from Last 1 Encounters:  01/24/22 45.2 kg     BMI:  Body mass index is 19.46 kg/m.  Estimated Nutritional Needs:   Kcal:  1600-1800 kcals  Protein:  100-115 g  Fluid:  >/= 1.6 L   Kerman Passey MS, RDN, LDN,  CNSC Registered Dietitian 3 Clinical Nutrition RD Pager and On-Call Pager Number Located in [place]

## 2022-01-24 NOTE — Progress Notes (Signed)
PHARMACY - TOTAL PARENTERAL NUTRITION CONSULT NOTE  Indication: Prolonged ileus  Patient Measurements: Height: 5' (152.4 cm) Weight: 45.2 kg (99 lb 10.4 oz) IBW/kg (Calculated) : 45.5 TPN AdjBW (KG): 42.2 Body mass index is 19.46 kg/m. Usual weight:  240 lbs in 2017, now 88 lbs with significant weight loss over the past 3 yrs  Assessment:  79 YOF with history of polysubstance abuse and bariatric surgery presented on 7/23 s/p intentional overdose.  CT and abd XR showed large amount of stool in the colon and an ileus. She has severe PCM with minimal nutrition since admit (TF from 7/23 until 7/25), so Pharmacy consulted to manage TPN.  Glucose / Insulin: hx DM on ?metformin PTA - hypoglycemic x2 on 7/29 prior to TPN initiation, now CBGs increased 180-313.  Used 27 units on sensitive SSI in the past 24 hrs. Electrolytes: *Initial labs 8/3 contaminated with TPN - repeated: K 3.7 (goal K >/= 4), Mag 1.9 (goal Mag >/= 2) on Lasix 40 IV BID, Na 136, others WNL Renal: SCr 0.38 -stable, BUN 9-stable Hepatic: LFTs mildly elevated/rising, tbili/TG WNL, albumin 1.6 Intake / Output; MIVF: UOP difficult to quantify (x8 unmeasured occurances), BM x 4 this AM on Lactulose 30 TID, no overt volume overload noted on exam but net +10L with wet lungs - giving Lasix Vitamin/Mineral panel: low Vit D/A/C.  Vitamins B1/B12, copper, zinc and folate WNL. GI Imaging:  8/2 CT Abd/P: pleural effusions, B/L renal collecting system dilatation with concern of cystitis GI Surgeries / Procedures: none since TPN initiation   Central access: PICC placed 01/17/22 TPN start date: 01/18/22  Nutritional Goals: RD Estimated Needs Total Energy Estimated Needs: 1600-1800 kcals Total Protein Estimated Needs: 100-115 g Total Fluid Estimated Needs: >/= 1.6 L  Current Nutrition:  TPN and Tube feeding - Trickle only  Plan:  Continue TPN to goal rate of 70 ml/hr at 1800 to provide 86g AA, 244g CHO and 44g ILE for a total of 1607  kCal, meeting 100% of needs Electrolytes in TPN: Na 25 mEq/L, K 621mq/L, Ca 5121m/L, Mg 21m67mL, Phos 51m73mL, max acetate  Add standard MVI and trace elements to TPN  Continue moderate SSI every 4 hours, Primary team increased Semglee to 15 units daily -- will NOT put any insulin in TPN at this time and monitor Standard TPN labs on Mon and Thurs CCM considering GI consult for decompressive flex sig - f/u F/u PO Vit A, D and C supplementation when taking orals Will repeat labs and TG in AM, monitor ability to advance tube feeds  Potassium Chloride 40 mEq x1 and Magnesium sulfate 2g IV x1 Monitor BMP, Mag in AM Monitor response to Lasix and determine if need to concentrate TPN further  JessSloan LeiterarmD, BCPS, BCCCP Clinical Pharmacist Please refer to [facility] 01/24/2022, 11:09 AM

## 2022-01-24 NOTE — Consult Note (Signed)
   56 yo F PMH schizoaffective disorder (bipolar type), Depression, prior SI, polysubstance use disorder, COPD, HTN, DM presented to the ED following suicide attempt. Patient had been drinking, while with friends stated that she was going to kill herself and was witnessed ingesting a bottle of promethazine.   In transport with EMS became more somnolent. Arrived to ED with GCS 3 and was intubated in this setting. EM discussed case with poison control who recommended supportive care   Psychiatry consulted due to suicide attempt by overdose.  Patient was recently trached 07/30, and currently trialing a trach collar  this morning. She continues to be open to psychiatric visitation, her mother and sitter are present at the bedside. Throughout todays evaluation she reports that she is in extreme abdominal pain. She motions for me to go get the doctor on several occasions "go right now".    Patient is seen and assessed face to face by this nurse practitioner, chart reviewed, sitter present at bedside.  Patient is alert and oriented x4, calm and cooperative, and pleasant. She rates her anxiety as a 10/10 with 10 being the worst. She also forwards information that her sleep is poor, and unsure if she had to go on the vent.  With the exception of her anxiety and insomnia, she denies any additional acute psychiatric symptoms at this time. She denies any suicidal ideations, homicidal ideations and or hallucinations.    She continues to demonstrate clear lucid thought and processes by way of mouthing words, lip syncing, and hand gestures. She remains NPO, denies having an appetite due to her belly pain.   As per mom she is scheduled for swallow study. She denies any side effects from her medications.  All questions comments and concerns were addressed by both patient and mother.    Patient continues to meet inpatient psychiatric criteria and IVC.   -WIll split Haldol '2mg'$  po bid, '1mg'$  Ativan po bid for agitation,  anxiety, and delirium. EKG obtained 08/04 , QTc 436. -WIll obtain labs and repeat EKG.  -Initiated communication with Dr. Tacy Learn, regarding concerns of abdominal distention and patient ongoing reports of belly pain "severe". ICU team is monitoring closely.  -Continue IVC papers as she remains a risk to self.  -Continue delirium precautions as directed.

## 2022-01-24 NOTE — Progress Notes (Signed)
Patient seen today by trach team for consult.  No education is needed at this time.  All necessary equipment is at beside.   Will continue to follow for progression. Patient is still on full ventilatory support. 

## 2022-01-24 NOTE — Progress Notes (Signed)
NAME:  [name], MRN:  [id number], DOB:  1966-01-07, LOS: 12 ADMISSION DATE:  01/12/2022, CONSULTATION DATE:  01/12/21 REFERRING MD:  Sedonia Small - EM, CHIEF COMPLAINT:  Intentional Overdose    History of Present Illness:  56 yo F PMH schizoaffective disorder (bipolar type), Depression, prior SI, polysubstance use disorder, COPD, HTN, DM presented to the ED following suicide attempt. Patient had been drinking, while with friends stated that she was going to kill herself and was witnessed ingesting a bottle of promethazine.   In transport with EMS became more somnolent. Arrived to ED with GCS 3 and was intubated in this setting. EM discussed case with poison control who recommended supportive care  PCCM consulted for admission in this setting.  Pertinent Medical History:  Schizoaffective disorder, bipolar type Depression Polysubstance abuse  COPD HTN DM    Significant Hospital Events: Including procedures, antibiotic start and stop dates in addition to other pertinent events   7/23 ED after etoh consumption and intentional promethazine overdose. Intubated. Admit to ICU  7/24 Tachycardic to 130s overnight, also with episode of whole body stiffening, ?dystonia vs. seizure. EEG with irritability, LTM initiated. Neuro consulted. 7/25 Extubated, intermittently somnolent/agitated, not managing secretions well. O2 sats stable on [place]. Cortrak difficult to flush. 7/26 Reintubated early AM for hypoxia, airway protection. Unasyn initiated for ?aspiration PNA. LTM EEG discontinued. MRI pending. 7/27 MRI no acute changes. Wakes up follows commands on .7 dex  7/28 off NE  01/19/2022>> Trach after goals of care discussion with daughter 7/31 Transitioned to ATC.  8/2 CT abdomen: Stool in right colon.  Normal appendix.  Placed gastric bypass surgery.  Nasogastric tube in stomach.  No evidence of wall thickening or obstruction and bowel.  Questionable cystitis.  Bibasilar pleural effusions.  Ceftriaxone  started.  Interim History / Subjective:  Patient is seen Haldol and Ativan at night for psych recommendations She tolerated trach collar for 16h before placed back on ventilator due to tachypnea and increased work of breathing Having bowel movement but continued to complain of abdominal pain Remain afebrile   Objective:  Blood pressure (!) 156/96, pulse (!) 104, temperature 99.2 F (37.3 C), temperature source Oral, resp. rate (!) 29, height 5' (1.524 m), weight 45.2 kg, last menstrual period 01/08/2011, SpO2 95 %.    Vent Mode: PRVC FiO2 (%):  [40 %] 40 % Set Rate:  [18 bmp] 18 bmp Vt Set:  [360 mL] 360 mL PEEP:  [5 cmH20] 5 cmH20 Plateau Pressure:  [21 cmH20] 21 cmH20   Intake/Output Summary (Last 24 hours) at 01/24/2022 1201 Last data filed at 01/24/2022 1155 Gross per 24 hour  Intake 2293.25 ml  Output 750 ml  Net 1543.25 ml   Filed Weights   01/22/22 0400 01/23/22 0500 01/24/22 0500  Weight: 44.7 kg 42.8 kg 45.2 kg   Physical Examination: General: Acute on chronically ill-appearing female, lying on the bed HEENT: [place]/AT, eyes anicteric.  moist mucus membranes.  S/p trach Neuro: Alert, awake following commands Chest: Coarse breath sounds, no wheezes or rhonchi Heart: Regular rate and rhythm, no murmurs or gallops Abdomen: Soft, diffusely tender, nondistended, bowel sounds present Skin: No rash   Resolved Hospital Problem List:   Hypotension, suspect sedation related   Acute toxic encephalopathy in the setting of drug overdose Hypokalemia  Assessment & Plan:  Acute toxic metabolic encephalopathy, initially in setting of intentional overdose  Seizure disorder MRI brain 7/27 no acute abnormalities  Continue Keppra 500 mg twice daily Still requiring Precedex,  appreciate psychiatry follow-up Started on Haldol and Ativan Try to taper off Precedex  SI with intentional promethazine overdose Schizoaffective disorder -- Bipolar type MDD  Polysubstance use disorder   Phenobarb taper completed. On Abilify at home? Continue IVC  Acute respiratory failure with hypoxia initially in setting of overdose, then 2/2 aspiration Completed antibiotic therapy Still having thin secretions, likely due to volume overload We will try Lasix 40 mg twice daily Tolerated trach collar for 16 hours yesterday, before going back on ventilator due to tachypnea and increased work of breathing Continue trach collar as long as she can tolerate  Tobacco use disorder  COPD, not exacerbation Continue nebs as needed Counseling provided regarding quit smoking  Acute UTI Continue ceftriaxone day 3 of 5  Abdominal pain Ileus - Resolved Severe protein calorie malnutrition History of gastric bypass in 2017 Received relistor, subQ neostigmine CT abdomen pelvis is negative for acute findings except stool burden Continue lactulose Continue TPN Started on trickle tube feeds Dietitian is following  Iron deficiency anemia Monitor H&H Continue iron supplements  DM2 with hyperglycemia Increase Lantus to 10 units twice daily Continue sliding scale with CBG goal 140-180   Best Practice (right click and "Reselect all SmartList Selections" daily)   Diet/type: TPN, trickle tube feeds DVT prophylaxis: SCD and heparin. GI prophylaxis: PPI Lines: N/A Foley:  N/A Code Status:  full code Last date of multidisciplinary goals of care discussion [spoke with daughter 8/2]    Total critical care time: 34 minutes  Performed by: [place] care time was exclusive of separately billable procedures and treating other patients.   Critical care was necessary to treat or prevent imminent or life-threatening deterioration.   Critical care was time spent personally by me on the following activities: development of treatment plan with patient and/or surrogate as well as nursing, discussions with consultants, evaluation of patient's response to treatment, examination of  patient, obtaining history from patient or surrogate, ordering and performing treatments and interventions, ordering and review of laboratory studies, ordering and review of radiographic studies, pulse oximetry and re-evaluation of patient's condition.   Jacky Kindle, MD [facility] See Amion for pager If no response to pager, please call [phone] until 7pm After 7pm, Please call E-link [phone]

## 2022-01-24 NOTE — Progress Notes (Signed)
Speech Language Pathology Treatment: [name] Speaking valve  Patient Details Name: [name] MRN: [id number] DOB: 1966-01-19 Today's Date: 01/24/2022 Time: 2725-3664 SLP Time Calculation (min) (ACUTE ONLY): 34 min  Assessment / Plan / Recommendation Clinical Impression  Pt was seen for treatment and was cooperative during the session. Pt's RN reported that pt needed to be returned to the vent at 0300 and then returned to ATC at 0800. Pt's RN contacted SLP regarding concern about the MBS this afternoon due to pt's RR in the 30s and increased secretions on trach collar. It was agreed that SLP will plan to see pt this morning to re-assess, but these have improved since SLP was contacted. Pt tolerated PMSV for 20 minutes with vitals stable and ranging RR 25-31, SpO2 97-98, and HR 100-102. Pt participated in conversation with improved vocal quality compared to when she was last seen by this SLP on 8/1. A hoarse vocal quality was noted with reduced vocal intensity. With prompts and cues to improve respiratory support for speech vocal intensity and speech intelligibility improved. Despite pt's attempts, pt was unable to expectorate secretions orally. Pt was repeatedly asking for food during the session. It was agreed that if pt is able to continue tolerating trach collar until at least 1200, SLP will plan to proceed with the MBS at 1300. PSMV may be used intermittently with staff if full supervision is provided.    HPI HPI: Pt is a 56 y.o. female admitted 01/12/22 after suicide attempt with intentional drug overdose of promethazine. ETT 7/23-7/25; reintubated for hypoxia 7/26- trach 7/30. ATC initiated on 7/31. EEG 7/31: moderate severe diffuse encephalopathy, nonspecific etiology; no seizures. Cortrak placed 7/31. PMH: Afib, polysubstance abuse, COPD, DM, neuropathy, HTN, bipolar disorder, schizophrenia, anxiety, depression, ETOH abuse.      SLP Plan  MBS      Recommendations for follow up therapy  are one component of a multi-disciplinary discharge planning process, led by the attending physician.  Recommendations may be updated based on patient status, additional functional criteria and insurance authorization.    Recommendations         Patient may use Passy-Muir Speech Valve: Intermittently with supervision PMSV Supervision: Full         Oral Care Recommendations: Oral care BID Follow Up Recommendations: Skilled nursing-short term rehab (<3 hours/day) Assistance recommended at discharge: Frequent or constant Supervision/Assistance SLP Visit Diagnosis: Dysphagia, unspecified (R13.10);[place] (R49.1) Plan: MBS          [name], [place], [place] Office number [phone]  Horton Marshall  01/24/2022, 11:12 AM

## 2022-01-24 NOTE — Progress Notes (Signed)
OT Cancellation Note  Patient Details Name: [name] MRN: [id number] DOB: 07/03/1965   Cancelled Treatment:    Reason Eval/Treat Not Completed: Patient at procedure or test/ unavailable.  OT to continue efforts as appropriate.    Laketta Soderberg D Jaksen Fiorella 01/24/2022, 2:26 PM

## 2022-01-24 NOTE — TOC Progression Note (Signed)
Transition of Care [facility]) - Progression Note    Patient Details  Name: CHANDA LAPERLE MRN: [id number] Date of Birth: [birth year]  Transition of Care [facility]) CM/SW [place], [place] Phone Number: 01/24/2022, 12:38 PM  Clinical Narrative:     CSW continues to follow patient. Patient re IVC'd on 7/31. TOC will continue to follow and assist with patients dc planning needs.  Expected Discharge Plan:  (inpatient psych) Barriers to Discharge: Continued Medical Work up  Expected Discharge Plan and Services Expected Discharge Plan:  (inpatient psych) In-house Referral: Clinical Social Work Discharge Planning Services: CM Consult   Living arrangements for the past 2 months: [place] Determinants of Health (SDOH) Interventions    Readmission Risk Interventions    07/27/2020   11:06 AM  Readmission Risk Prevention Plan  Transportation Screening Complete  PCP or Specialist Appt within 5-7 Days Complete  Home Care Screening Complete

## 2022-01-24 NOTE — Progress Notes (Signed)
Modified Barium Swallow Progress Note  Patient Details  Name: [name] MRN: [id number] Date of Birth: 10-Dec-1965  Today's Date: 01/24/2022  Modified Barium Swallow completed.  Full report located under Chart Review in the Imaging Section.  Brief recommendations include the following:  Clinical Impression  The was study was completed with PMSV on and subsequently off. Pt presents with oropharyngeal dysphagia characterized by reduced bolus cohesion, reduced anterior laryngeal movement, and a pharyngeal delay. She demonstrated mildly prolonged mastiation, and premature spillage to the valleculae. Penetration (PAS 3) was noted with large boluses of thin liquids via straw when the PMSV was donned. Laryngeal invasion of thin liquids progressed to aspiration (PAS 7) with the PMSV doffed. A dysphagia 2 diet with nectar thick liquids will be initiated at this time with observance of swallowing precautions including use of the PMSV during meals with full supervision. SLP will continue to follow pt and prognosis of advancement is judged to be good.    Swallow Evaluation Recommendations       SLP Diet Recommendations: Dysphagia 2 (Fine chop) solids;Nectar thick liquid   Liquid Administration via: Cup;Straw   Medication Administration: Whole meds with puree   Supervision: Staff to assist with self feeding;Full supervision/cueing for compensatory strategies   Compensations: Slow rate;Small sips/bites   Postural Changes: Seated upright at 90 degrees          Damen Windsor I. Hardin Negus, [place], [place] Office number [phone]   Horton Marshall 01/24/2022,3:54 PM

## 2022-01-24 NOTE — Progress Notes (Signed)
eLink Physician-Brief Progress Note Patient Name: [name] DOB: 1965-09-27 MRN: [id number]   Date of Service  01/24/2022  HPI/Events of Note  Agitation/abdominal pain with distension. No cortrak Previous ileus and currently stooling per RN  eICU Interventions  IV tylenol PRN once ordered If pain persistent, consider KUB in am     Intervention Category Minor Interventions: Routine modifications to care plan (e.g. PRN medications for pain, fever)  Mariela Rex Rodman Pickle 01/24/2022, 9:32 PM

## 2022-01-24 NOTE — Progress Notes (Signed)
Physical Therapy Treatment Patient Details Name: [name] MRN: [id number] DOB: [birth year] Today's Date: 01/24/2022   History of Present Illness 56 y.o. female admitted 01/12/22 after suicide attempt with intentional promethazine OD. EEG without seizures; showed evidence of epileptogenicity. ETT 7/23-7/25. Reintubated 7/26. Ileus. 7/30 extubated but then trached and returned to vent. PMHx:Afib, COPD, DM, neuropathy, HTN, bipolar disorder, schizophrenia, anxiety, depression, ETOH abuse.    PT Comments    Pt with improved gait tolerance with ability to walk in hall with RW on trach collar at 45% with SPO2 90-94% and returned to 40% FiO2 at resst with SPO2 98%, HR 102. Pt with decreased cognition and awareness frequently asking for water and needing cues for direction. Pt remains with intermittent vent support and SNf remains medically appropriate. In chair with sitter and alarm end of session.     Recommendations for follow up therapy are one component of a multi-disciplinary discharge planning process, led by the attending physician.  Recommendations may be updated based on patient status, additional functional criteria and insurance authorization.  Follow Up Recommendations  Skilled nursing-short term rehab (<3 hours/day) Can patient physically be transported by private vehicle: Yes   Assistance Recommended at Discharge Frequent or constant Supervision/Assistance  Patient can return home with the following Assist for transportation;Help with stairs or ramp for entrance;Assistance with cooking/housework;Direct supervision/assist for medications management;Direct supervision/assist for financial management;A little help with walking and/or transfers;A little help with bathing/dressing/bathroom   Equipment Recommendations  Rolling walker (2 wheels);BSC/3in1    Recommendations for Other Services       Precautions / Restrictions Precautions Precautions: Fall;Other (comment) Precaution  Comments: trach, cortrak, intermittent vent, watch sats, incontinent stool with coughing     Mobility  Bed Mobility Overal bed mobility: Needs Assistance Bed Mobility: Supine to Sit     Supine to sit: Min guard, HOB elevated     General bed mobility comments: HOB 40 degrees with mod cues to controlled movement and need for line management first. Pt with incontinent bowel on arrival and copious secretions on gown with assist for pericare and linen change prior to mobility    Transfers Overall transfer level: Needs assistance   Transfers: Sit to/from Stand Sit to Stand: Min guard           General transfer comment: guarding with cues for safety and lines with pt able to stand from bed and toilet with guarding and UE support on rail or IV pole to stand    Ambulation/Gait Ambulation/Gait assistance: Min assist Gait Distance (Feet): 300 Feet Assistive device: Rolling walker (2 wheels) Gait Pattern/deviations: Step-through pattern, Decreased stride length   Gait velocity interpretation: 1.31 - 2.62 ft/sec, indicative of limited community ambulator   General Gait Details: pt with use of RW and able to self direct distance with pt on 45% trach collar at 94% with activity. pt with episode of bowel incontinence while walking with coughing   Stairs             Wheelchair Mobility    Modified Rankin (Stroke Patients Only)       Balance Overall balance assessment: Needs assistance Sitting-balance support: No upper extremity supported, Feet supported Sitting balance-Leahy Scale: Good Sitting balance - Comments: sitting without support   Standing balance support: Single extremity supported, Bilateral upper extremity supported, Reliant on assistive device for balance Standing balance-Leahy Scale: Good Standing balance comment: static standing with single UE support, RW for gait  Cognition Arousal/Alertness: Awake/alert Behavior  During Therapy: Flat affect Overall Cognitive Status: Impaired/Different from baseline Area of Impairment: Orientation, Safety/judgement                 Orientation Level: Disoriented to, Time   Memory: Decreased short-term memory Following Commands: Follows one step commands consistently Safety/Judgement: Decreased awareness of deficits, Decreased awareness of safety   Problem Solving: Slow processing General Comments: pt continues to ask for water despite cortrak and respiratory status. Following commands but unable to recognize incontinent bowel consistently        Exercises      General Comments        Pertinent Vitals/Pain Pain Assessment Pain Assessment: No/denies pain    Home Living                          Prior Function            PT Goals (current goals can now be found in the care plan section) Progress towards PT goals: Progressing toward goals    Frequency    Min 3X/week      PT Plan Current plan remains appropriate    Co-evaluation              AM-PAC PT "6 Clicks" Mobility   Outcome Measure  Help needed turning from your back to your side while in a flat bed without using bedrails?: A Little Help needed moving from lying on your back to sitting on the side of a flat bed without using bedrails?: A Little Help needed moving to and from a bed to a chair (including a wheelchair)?: A Little Help needed standing up from a chair using your arms (e.g., wheelchair or bedside chair)?: A Little Help needed to walk in hospital room?: A Little Help needed climbing 3-5 steps with a railing? : A Lot 6 Click Score: 17    End of Session Equipment Utilized During Treatment: Oxygen Activity Tolerance: Patient tolerated treatment well Patient left: with call bell/phone within reach;with nursing/sitter in room;in chair;with chair alarm set Nurse Communication: Mobility status PT Visit Diagnosis: Other abnormalities of gait and mobility  (R26.89);Difficulty in walking, not elsewhere classified (R26.2);Muscle weakness (generalized) (M62.81)     Time: 5681-2751 PT Time Calculation (min) (ACUTE ONLY): 30 min  Charges:  $Gait Training: 8-22 mins $Therapeutic Activity: 8-22 mins                     Bayard Males, PT Acute Rehabilitation Services Office: [place] 01/24/2022, 9:49 AM

## 2022-01-25 DIAGNOSIS — K567 Ileus, unspecified: Secondary | ICD-10-CM | POA: Diagnosis not present

## 2022-01-25 DIAGNOSIS — T50902A Poisoning by unspecified drugs, medicaments and biological substances, intentional self-harm, initial encounter: Secondary | ICD-10-CM | POA: Diagnosis not present

## 2022-01-25 DIAGNOSIS — T50902D Poisoning by unspecified drugs, medicaments and biological substances, intentional self-harm, subsequent encounter: Secondary | ICD-10-CM | POA: Diagnosis not present

## 2022-01-25 DIAGNOSIS — E876 Hypokalemia: Secondary | ICD-10-CM | POA: Diagnosis not present

## 2022-01-25 LAB — BASIC METABOLIC PANEL
Anion gap: 5 (ref 5–15)
BUN: 11 mg/dL (ref 6–20)
CO2: 30 mmol/L (ref 22–32)
Calcium: 8 mg/dL — ABNORMAL LOW (ref 8.9–10.3)
Chloride: 98 mmol/L (ref 98–111)
Creatinine, Ser: 0.44 mg/dL (ref 0.44–1.00)
GFR, Estimated: >60 mL/min (ref >60)
Glucose, Bld: 179 mg/dL — ABNORMAL HIGH (ref 70–99)
Potassium: 4.1 mmol/L (ref 3.5–5.1)
Sodium: 133 mmol/L — ABNORMAL LOW (ref 135–145)

## 2022-01-25 LAB — MAGNESIUM: Magnesium: 2 mg/dL (ref 1.7–2.4)

## 2022-01-25 LAB — GLUCOSE, CAPILLARY
Glucose-Capillary: 180 mg/dL — ABNORMAL HIGH (ref 70–99)
Glucose-Capillary: 191 mg/dL — ABNORMAL HIGH (ref 70–99)
Glucose-Capillary: 200 mg/dL — ABNORMAL HIGH (ref 70–99)
Glucose-Capillary: 231 mg/dL — ABNORMAL HIGH (ref 70–99)
Glucose-Capillary: 296 mg/dL — ABNORMAL HIGH (ref 70–99)
Glucose-Capillary: 196 mg/dL — ABNORMAL HIGH (ref 70–99)

## 2022-01-25 MED ORDER — VITAMIN A 15 MG/ML IM SOLN
100000.0000 [IU] | Freq: Every day | INTRAMUSCULAR | Status: AC
  Administered 2022-01-25 – 2022-01-27 (×3): 100000 [IU] via INTRAMUSCULAR
  Filled 2022-01-25 (×5): qty 2

## 2022-01-25 MED ORDER — INSULIN GLARGINE-YFGN 100 UNIT/ML ~~LOC~~ SOLN
16.0000 [IU] | Freq: Every day | SUBCUTANEOUS | Status: DC
  Administered 2022-01-25 – 2022-01-26 (×2): 16 [IU] via SUBCUTANEOUS
  Filled 2022-01-25 (×2): qty 0.16

## 2022-01-25 MED ORDER — VITAMIN D (ERGOCALCIFEROL) 1.25 MG (50000 UNIT) PO CAPS
50000.0000 [IU] | ORAL_CAPSULE | ORAL | Status: DC
  Administered 2022-01-25 – 2022-02-08 (×3): 50000 [IU] via ORAL
  Filled 2022-01-25 (×3): qty 1

## 2022-01-25 MED ORDER — VITAMIN A 15 MG/ML IM SOLN
50000.0000 [IU] | Freq: Every day | INTRAMUSCULAR | Status: AC
  Administered 2022-01-28 – 2022-02-08 (×8): 50000 [IU] via INTRAMUSCULAR
  Filled 2022-01-25 (×17): qty 2

## 2022-01-25 MED ORDER — VITAMIN A 3 MG (10000 UNIT) PO CAPS
10000.0000 [IU] | ORAL_CAPSULE | Freq: Every day | ORAL | Status: DC
  Administered 2022-02-12: 10000 [IU] via ORAL
  Filled 2022-01-25 (×2): qty 1

## 2022-01-25 MED ORDER — TRAVASOL 10 % IV SOLN
INTRAVENOUS | Status: AC
  Filled 2022-01-25: qty 856.8

## 2022-01-25 MED ORDER — FUROSEMIDE 10 MG/ML IJ SOLN
40.0000 mg | Freq: Two times a day (BID) | INTRAMUSCULAR | Status: AC
  Administered 2022-01-25 (×2): 40 mg via INTRAVENOUS
  Filled 2022-01-25 (×2): qty 4

## 2022-01-25 MED ORDER — ASCORBIC ACID 500 MG PO TABS
500.0000 mg | ORAL_TABLET | Freq: Every day | ORAL | Status: DC
Start: 2022-01-25 — End: 2022-02-12
  Administered 2022-01-25 – 2022-02-12 (×19): 500 mg via ORAL
  Filled 2022-01-25 (×20): qty 1

## 2022-01-25 NOTE — Progress Notes (Addendum)
NAME:  [name], MRN:  [id number], DOB:  05-08-1966, LOS: 19 ADMISSION DATE:  01/12/2022, CONSULTATION DATE:  01/12/21 REFERRING MD:  Sedonia Small - EM, CHIEF COMPLAINT:  Intentional Overdose    History of Present Illness:  56 yo F PMH schizoaffective disorder (bipolar type), Depression, prior SI, polysubstance use disorder, COPD, HTN, DM presented to the ED following suicide attempt. Patient had been drinking, while with friends stated that she was going to kill herself and was witnessed ingesting a bottle of promethazine.   In transport with EMS became more somnolent. Arrived to ED with GCS 3 and was intubated in this setting. EM discussed case with poison control who recommended supportive care  PCCM consulted for admission in this setting.  Pertinent Medical History:  Schizoaffective disorder, bipolar type Depression Polysubstance abuse  COPD HTN DM    Significant Hospital Events: Including procedures, antibiotic start and stop dates in addition to other pertinent events   7/23 ED after etoh consumption and intentional promethazine overdose. Intubated. Admit to ICU  7/24 Tachycardic to 130s overnight, also with episode of whole body stiffening, ?dystonia vs. seizure. EEG with irritability, LTM initiated. Neuro consulted. 7/25 Extubated, intermittently somnolent/agitated, not managing secretions well. O2 sats stable on [place]. Cortrak difficult to flush. 7/26 Reintubated early AM for hypoxia, airway protection. Unasyn initiated for ?aspiration PNA. LTM EEG discontinued. MRI pending. 7/27 MRI no acute changes. Wakes up follows commands on .7 dex  7/28 off NE  01/19/2022>> Trach after goals of care discussion with daughter 7/31 Transitioned to ATC.  8/2 CT abdomen: Stool in right colon.  Normal appendix.  Placed gastric bypass surgery.  Nasogastric tube in stomach.  No evidence of wall thickening or obstruction and bowel.  Questionable cystitis.  Bibasilar pleural effusions.  Ceftriaxone  started.  Interim History / Subjective:  Patient remained agitated and restless, Precedex has to be restarted back Went back on ventilator to due to tachypnea   Objective:  Blood pressure 130/65, pulse (!) 113, temperature 99.1 F (37.3 C), temperature source Oral, resp. rate (!) 37, height 5' (1.524 m), weight 42.5 kg, last menstrual period 01/08/2011, SpO2 100 %.    Vent Mode: PRVC FiO2 (%):  [40 %] 40 % Set Rate:  [18 bmp] 18 bmp Vt Set:  [360 mL] 360 mL PEEP:  [5 cmH20] 5 cmH20 Plateau Pressure:  [20 cmH20-24 cmH20] 20 cmH20   Intake/Output Summary (Last 24 hours) at 01/25/2022 0930 Last data filed at 01/25/2022 0800 Gross per 24 hour  Intake 2920.67 ml  Output 1200 ml  Net 1720.67 ml   Filed Weights   01/23/22 0500 01/24/22 0500 01/25/22 0437  Weight: 42.8 kg 45.2 kg 42.5 kg   Physical Examination: General: Acute on chronically ill-appearing female, lying on the bed.  Looks severely anxious HEENT: [place]/AT, eyes anicteric.  moist mucus membranes.  S/p trach Neuro: Alert, awake following commands Chest: Neck, coarse breath sounds, no wheezes or rhonchi Heart: Tachycardic, regular rhythm, no murmurs or gallops Abdomen: Soft, diffusely tender, nondistended, bowel sounds present Skin: No rash   Resolved Hospital Problem List:   Hypotension, suspect sedation related   Acute toxic encephalopathy in the setting of drug overdose Hypokalemia  Assessment & Plan:  Acute toxic metabolic encephalopathy, initially in setting of intentional overdose  Seizure disorder Mental status back to normal Continue Keppra 500 mg twice daily Still requiring Precedex intermittently, appreciate psychiatry follow-up Continue Haldol and Ativan twice daily Try to keep Precedex off  SI with intentional promethazine overdose  Schizoaffective disorder -- Bipolar type MDD  Polysubstance use disorder  Phenobarb taper completed. Continue IVC  Acute respiratory failure with hypoxia initially in  setting of overdose, then 2/2 aspiration s/p trach Completed antibiotic therapy Volume status is slightly better than yesterday We will try Lasix 40 mg twice daily for today Tolerated trach collar for 8 hours yesterday, before going back on ventilator due to tachypnea and increased work of breathing Continue trach collar as long as she can tolerate  Tobacco use disorder  COPD, not exacerbation Continue nebs as needed Counseling provided regarding quit smoking  Acute UTI Continue ceftriaxone day 4 of 5  Abdominal pain Ileus - Resolved Severe protein calorie malnutrition History of gastric bypass in 2017 Received relistor, subQ neostigmine CT abdomen pelvis is negative for acute findings except stool burden Continue lactulose She is having bowel movements Lactate is 2.8 Continue TPN Started on dysphagia diet after passing swallow evaluation Dietitian is following  Iron deficiency anemia Monitor H&H Continue iron supplements  DM2 with hyperglycemia Increase Lantus to 12 units twice daily Continue sliding scale with CBG goal 140-180  Hyponatremia Likely due to Lasix therapy Closely monitor electrolyte   Best Practice (right click and "Reselect all SmartList Selections" daily)   Diet/type: TPN, dysphagia diet DVT prophylaxis: SCD and heparin. GI prophylaxis: PPI Lines: N/A Foley:  N/A Code Status:  full code Last date of multidisciplinary goals of care discussion [spoke with daughter 8/2]    Total critical care time: 32 minutes  Performed by: [place] care time was exclusive of separately billable procedures and treating other patients.   Critical care was necessary to treat or prevent imminent or life-threatening deterioration.   Critical care was time spent personally by me on the following activities: development of treatment plan with patient and/or surrogate as well as nursing, discussions with consultants, evaluation of patient's response  to treatment, examination of patient, obtaining history from patient or surrogate, ordering and performing treatments and interventions, ordering and review of laboratory studies, ordering and review of radiographic studies, pulse oximetry and re-evaluation of patient's condition.   Jacky Kindle, MD [facility] See Amion for pager If no response to pager, please call [phone] until 7pm After 7pm, Please call E-link [phone]

## 2022-01-25 NOTE — Progress Notes (Addendum)
PHARMACY - TOTAL PARENTERAL NUTRITION CONSULT NOTE  Indication: Prolonged ileus  Patient Measurements: Height: 5' (152.4 cm) Weight: 42.5 kg (93 lb 11.1 oz) IBW/kg (Calculated) : 45.5 TPN AdjBW (KG): 42.2 Body mass index is 18.3 kg/m. Usual weight:  240 lbs in 2017, now 88 lbs with significant weight loss over the past 3 yrs  Assessment:  73 YOF with history of polysubstance abuse and bariatric surgery presented on 7/23 s/p intentional overdose.  CT and abd XR showed large amount of stool in the colon and an ileus. She has severe PCM with minimal nutrition since admit (TF from 7/23 until 7/25), so Pharmacy consulted to manage TPN.  Pt removed CorTrak on 8/4 and TFs were discontinued at 1639. Pt passed swallow eval for Dysphagia 2 diet. Pt has eaten ~50% of her breakfast. Dietician is performing a calorie count.  Glucose / Insulin: hx DM on ?metformin PTA - hypoglycemic x2 on 7/29 prior to TPN initiation, now CBGs increased 137-301. Used 27 units on sensitive SSI in the past 24 hrs. Glargine increased to 12 units daily. BG trend improving. Electrolytes: Na 133, CoCa 9.9, others WNL Renal: SCr 0.44 -stable, BUN 11-stable Hepatic: LFTs mildly elevated/rising, tbili/TG WNL, albumin 1.6 Intake / Output; MIVF: UOP 1.2 ml/kg/hr, BM x 5 on 8/4 on Lactulose 30 TID, no overt volume overload noted on exam but net +12L with wet lungs - giving Lasix Vitamin/Mineral panel: low Vit D/A/C.  Vitamins B1/B12, copper, zinc and folate WNL. GI Imaging:  8/2 CT Abd/P: pleural effusions, B/L renal collecting system dilatation with concern of cystitis GI Surgeries / Procedures: none since TPN initiation   Central access: PICC placed 01/17/22 TPN start date: 01/18/22  Nutritional Goals: RD Estimated Needs Total Energy Estimated Needs: 1600-1800 kcals Total Protein Estimated Needs: 100-115 g Total Fluid Estimated Needs: >/= 1.6 L  Current Nutrition:  Soft diet and TPN  Plan:  Continue TPN to goal rate of  70 ml/hr at 1800 to provide 86g AA, 244g CHO and 44g ILE for a total of 1607 kCal, meeting 100% of needs Electrolytes in TPN: Na 30 mEq/L, K 55 mEq/L, Ca 5 mEq/L, Mg 5 mEq/L, Phos 12 mmol/L, max acetate  Add standard MVI and trace elements to TPN  Continue moderate SSI every 4 hours Increased insulin glargine to 16 units daily -- will NOT put any insulin in TPN at this time and monitor Standard TPN labs on Mon and Thurs CCM considering GI consult for decompressive flex sig - f/u Start Vit A, D and C supplementation    > Vitamin A 100,000 units IM daily x 3 days, followed by 50,000 units IM daily x 14 days, followed by 10,000 units PO daily x 60 days   > Vitamin D2 (ergocalciferol) 50,000 units PO once weekly x 8 weeks   > Vitamin C (ascorbic acid) '500mg'$  PO daily x28 days Monitor BMP, Mag in AM F/u on PO diet intake, calorie count per dietician, and plan for weaning TPN.  Luisa Hart, PharmD, BCPS Clinical Pharmacist 01/25/2022 9:31 AM   Please refer to [facility] for pharmacy phone number

## 2022-01-26 DIAGNOSIS — K567 Ileus, unspecified: Secondary | ICD-10-CM | POA: Diagnosis not present

## 2022-01-26 DIAGNOSIS — E876 Hypokalemia: Secondary | ICD-10-CM | POA: Diagnosis not present

## 2022-01-26 DIAGNOSIS — T50902A Poisoning by unspecified drugs, medicaments and biological substances, intentional self-harm, initial encounter: Secondary | ICD-10-CM | POA: Diagnosis not present

## 2022-01-26 DIAGNOSIS — T50902D Poisoning by unspecified drugs, medicaments and biological substances, intentional self-harm, subsequent encounter: Secondary | ICD-10-CM | POA: Diagnosis not present

## 2022-01-26 LAB — GLUCOSE, CAPILLARY
Glucose-Capillary: 105 mg/dL — ABNORMAL HIGH (ref 70–99)
Glucose-Capillary: 191 mg/dL — ABNORMAL HIGH (ref 70–99)
Glucose-Capillary: 210 mg/dL — ABNORMAL HIGH (ref 70–99)
Glucose-Capillary: 236 mg/dL — ABNORMAL HIGH (ref 70–99)
Glucose-Capillary: 262 mg/dL — ABNORMAL HIGH (ref 70–99)
Glucose-Capillary: 313 mg/dL — ABNORMAL HIGH (ref 70–99)

## 2022-01-26 LAB — BASIC METABOLIC PANEL
Anion gap: 10 (ref 5–15)
BUN: 10 mg/dL (ref 6–20)
CO2: 28 mmol/L (ref 22–32)
Calcium: 7.8 mg/dL — ABNORMAL LOW (ref 8.9–10.3)
Chloride: 93 mmol/L — ABNORMAL LOW (ref 98–111)
Creatinine, Ser: 0.32 mg/dL — ABNORMAL LOW (ref 0.44–1.00)
GFR, Estimated: >60 mL/min (ref >60)
Glucose, Bld: 251 mg/dL — ABNORMAL HIGH (ref 70–99)
Potassium: 4.2 mmol/L (ref 3.5–5.1)
Sodium: 131 mmol/L — ABNORMAL LOW (ref 135–145)

## 2022-01-26 LAB — MAGNESIUM: Magnesium: 2 mg/dL (ref 1.7–2.4)

## 2022-01-26 MED ORDER — FOOD THICKENER (SIMPLYTHICK)
1.0000 | ORAL | Status: DC | PRN
  Filled 2022-01-26 (×3): qty 1

## 2022-01-26 MED ORDER — ACETAMINOPHEN 325 MG PO TABS
650.0000 mg | ORAL_TABLET | ORAL | Status: DC | PRN
  Administered 2022-01-27 – 2022-02-12 (×20): 650 mg via ORAL
  Filled 2022-01-26 (×22): qty 2

## 2022-01-26 MED ORDER — SIMETHICONE 40 MG/0.6ML PO SUSP
40.0000 mg | Freq: Four times a day (QID) | ORAL | Status: DC | PRN
Start: 2022-01-26 — End: 2022-01-27
  Administered 2022-01-26 – 2022-01-27 (×3): 40 mg via ORAL
  Filled 2022-01-26 (×3): qty 0.6

## 2022-01-26 MED ORDER — FUROSEMIDE 10 MG/ML IJ SOLN
40.0000 mg | Freq: Two times a day (BID) | INTRAMUSCULAR | Status: AC
  Administered 2022-01-26 – 2022-01-27 (×2): 40 mg via INTRAVENOUS
  Filled 2022-01-26 (×2): qty 4

## 2022-01-26 MED ORDER — POLYETHYLENE GLYCOL 3350 17 G PO PACK
17.0000 g | PACK | Freq: Every day | ORAL | Status: DC | PRN
  Administered 2022-02-11: 17 g via ORAL
  Filled 2022-01-26: qty 1

## 2022-01-26 MED ORDER — INSULIN GLARGINE-YFGN 100 UNIT/ML ~~LOC~~ SOLN: 20.0000 [IU] | Freq: Every day | SUBCUTANEOUS | Status: DC

## 2022-01-26 MED ORDER — DOCUSATE SODIUM 100 MG PO CAPS
100.0000 mg | ORAL_CAPSULE | Freq: Two times a day (BID) | ORAL | Status: DC
  Administered 2022-01-26 – 2022-02-12 (×23): 100 mg via ORAL
  Filled 2022-01-26 (×27): qty 1

## 2022-01-26 MED ORDER — PANTOPRAZOLE SODIUM 40 MG PO TBEC
40.0000 mg | DELAYED_RELEASE_TABLET | Freq: Every day | ORAL | Status: DC
  Administered 2022-01-26 – 2022-02-11 (×17): 40 mg via ORAL
  Filled 2022-01-26 (×17): qty 1

## 2022-01-26 MED ORDER — INSULIN GLARGINE-YFGN 100 UNIT/ML ~~LOC~~ SOLN
22.0000 [IU] | Freq: Every day | SUBCUTANEOUS | Status: DC
  Administered 2022-01-27 – 2022-01-28 (×2): 22 [IU] via SUBCUTANEOUS
  Filled 2022-01-26 (×2): qty 0.22

## 2022-01-26 MED ORDER — LACTULOSE 10 GM/15ML PO SOLN
30.0000 g | Freq: Three times a day (TID) | ORAL | Status: DC
Start: 2022-01-26 — End: 2022-02-12
  Administered 2022-01-26 – 2022-02-12 (×30): 30 g via ORAL
  Filled 2022-01-26 (×39): qty 45

## 2022-01-26 MED ORDER — INSULIN ASPART 100 UNIT/ML IJ SOLN
0.0000 [IU] | Freq: Three times a day (TID) | INTRAMUSCULAR | Status: DC
  Administered 2022-01-26: 8 [IU] via SUBCUTANEOUS
  Administered 2022-01-27: 11 [IU] via SUBCUTANEOUS
  Administered 2022-01-27: 5 [IU] via SUBCUTANEOUS
  Administered 2022-01-27: 3 [IU] via SUBCUTANEOUS

## 2022-01-26 MED ORDER — INSULIN GLARGINE-YFGN 100 UNIT/ML ~~LOC~~ SOLN
6.0000 [IU] | Freq: Once | SUBCUTANEOUS | Status: AC
Start: 2022-01-26 — End: 2022-01-26
  Administered 2022-01-26: 6 [IU] via SUBCUTANEOUS
  Filled 2022-01-26: qty 0.06

## 2022-01-26 MED ORDER — GUAIFENESIN 100 MG/5ML PO LIQD: 5.0000 mL | ORAL | Status: DC | PRN

## 2022-01-26 MED ORDER — TRAVASOL 10 % IV SOLN
INTRAVENOUS | Status: AC
  Filled 2022-01-26: qty 856.8

## 2022-01-26 NOTE — Consult Note (Signed)
   56 yo F PMH schizoaffective disorder (bipolar type), Depression, prior SI, polysubstance use disorder, COPD, HTN, DM presented to the ED following suicide attempt. Patient had been drinking, while with friends stated that she was going to kill herself and was witnessed ingesting a bottle of promethazine.   In transport with EMS became more somnolent. Arrived to ED with GCS 3 and was intubated in this setting. EM discussed case with poison control who recommended supportive care   Psychiatry consulted due to suicide attempt by overdose.  Patient was recently trached 07/30, and currently trialing a trach collar  this morning. She continues to be open to psychiatric visitation, her mother and sitter are present at the bedside.    Patient is seen and assessed face to face by this nurse practitioner, chart reviewed, sitter present at bedside.  Patient is alert and oriented x4, calm and cooperative, and pleasant. She rates her anxiety as a 5-6/10 with 10 being the worst.  She continues to complain of abdominal pain.  She reports this pain has not prevented her from eating, as she emotions to chocolate pudding sitting on her bedside table.  With the exception of her anxiety and insomnia, she denies any additional acute psychiatric symptoms at this time. She denies any suicidal ideations, homicidal ideations and or hallucinations.    She continues to demonstrate clear lucid thought and processes by way of mouthing words, lip syncing, and hand gestures.    Patient continues to meet inpatient psychiatric criteria and IVC.  No changes made at this time.  -WIll continue Haldol '2mg'$  po bid, '1mg'$  Ativan po bid for agitation, anxiety, and delirium. EKG obtained 08/04 , QTc 436.  Patient appears to be improving and responding to current psychiatric regimen. -WIll obtain labs and repeat EKG.  -Treatment team is aware of abdominal pain, explained to patient important to let medical provider be aware.  Her abdominal  distention does appear to have improved since previous assessment on Friday. -Continue IVC papers as she remains a risk to self.  Continue suicide sitter -Continue delirium precautions as directed.

## 2022-01-26 NOTE — Progress Notes (Signed)
NAME:  [name], MRN:  [id number], DOB:  1965/08/16, LOS: 73 ADMISSION DATE:  01/12/2022, CONSULTATION DATE:  01/12/21 REFERRING MD:  Sedonia Small - EM, CHIEF COMPLAINT:  Intentional Overdose    History of Present Illness:  56 yo F PMH schizoaffective disorder (bipolar type), Depression, prior SI, polysubstance use disorder, COPD, HTN, DM presented to the ED following suicide attempt. Patient had been drinking, while with friends stated that she was going to kill herself and was witnessed ingesting a bottle of promethazine.   In transport with EMS became more somnolent. Arrived to ED with GCS 3 and was intubated in this setting. EM discussed case with poison control who recommended supportive care  PCCM consulted for admission in this setting.  Pertinent Medical History:  Schizoaffective disorder, bipolar type Depression Polysubstance abuse  COPD HTN DM    Significant Hospital Events: Including procedures, antibiotic start and stop dates in addition to other pertinent events   7/23 ED after etoh consumption and intentional promethazine overdose. Intubated. Admit to ICU  7/24 Tachycardic to 130s overnight, also with episode of whole body stiffening, ?dystonia vs. seizure. EEG with irritability, LTM initiated. Neuro consulted. 7/25 Extubated, intermittently somnolent/agitated, not managing secretions well. O2 sats stable on [place]. Cortrak difficult to flush. 7/26 Reintubated early AM for hypoxia, airway protection. Unasyn initiated for ?aspiration PNA. LTM EEG discontinued. MRI pending. 7/27 MRI no acute changes. Wakes up follows commands on .7 dex  7/28 off NE  01/19/2022>> Trach after goals of care discussion with daughter 7/31 Transitioned to ATC.  8/2 CT abdomen: Stool in right colon.  Normal appendix.  Placed gastric bypass surgery.  Nasogastric tube in stomach.  No evidence of wall thickening or obstruction and bowel.  Questionable cystitis.  Bibasilar pleural effusions.  Ceftriaxone  started.  Interim History / Subjective:  Patient remained on trach collar for 24 hours Off Precedex infusion   Objective:  Blood pressure 126/77, pulse (!) 105, temperature 98.2 F (36.8 C), temperature source Axillary, resp. rate (!) 35, height 5' (1.524 m), weight 42.4 kg, last menstrual period 01/08/2011, SpO2 94 %.    FiO2 (%):  [35 %-40 %] 35 %   Intake/Output Summary (Last 24 hours) at 01/26/2022 1155 Last data filed at 01/26/2022 1000 Gross per 24 hour  Intake 2446.83 ml  Output 2400 ml  Net 46.83 ml   Filed Weights   01/24/22 0500 01/25/22 0437 01/26/22 0450  Weight: 45.2 kg 42.5 kg 42.4 kg   Physical Examination: General: Acute on chronically ill-appearing female, lying on the bed.  Looks anxious HEENT: [place]/AT, eyes anicteric.  moist mucus membranes.  S/p trach on trach collar Neuro: Alert, awake following commands Chest: Neck, coarse breath sounds, no wheezes or rhonchi Heart: Tachycardic, regular rhythm, no murmurs or gallops Abdomen: Soft, diffusely tender, nondistended, bowel sounds present Skin: No rash   Resolved Hospital Problem List:   Hypotension, suspect sedation related   Acute toxic encephalopathy in the setting of drug overdose Hypokalemia  Assessment & Plan:  Acute toxic metabolic encephalopathy, initially in setting of intentional overdose  Seizure disorder Mental status back to normal Continue Keppra 500 mg twice daily Remain off Precedex for 24 hours Continue Haldol and Ativan twice daily Psychiatry is following  SI with intentional promethazine overdose Schizoaffective disorder -- Bipolar type MDD  Polysubstance use disorder  Phenobarb taper completed. Continue IVC, bit better   Acute respiratory failure with hypoxia initially in setting of overdose, then 2/2 aspiration s/p trach Completed antibiotic therapy Volume  status is slightly better than yesterday We will try Lasix 40 mg twice daily for today again Patient has been on trach  collar for 24 hours now  Tobacco use disorder  COPD, not exacerbation Continue nebs as needed Counseling provided regarding quit smoking  Acute UTI Continue ceftriaxone day 5 of 5  Abdominal pain Ileus - Resolved Severe protein calorie malnutrition History of gastric bypass in 2017 Received relistor, subQ neostigmine CT abdomen pelvis is negative for acute findings except stool burden Continue lactulose She is having bowel movements Continue TPN Continue dysphagia diet to diet, monitor calorie count Consider stopping TPN by tomorrow Dietitian is following  Iron deficiency anemia Monitor H&H Continue iron supplements  DM2 with hyperglycemia Blood sugars remain uncontrolled, Increase Lantus to 13 units in the morning and 22 units at night Continue sliding scale with CBG goal 140-180  Hyponatremia Likely due to Lasix therapy Closely monitor electrolyte   Best Practice (right click and "Reselect all SmartList Selections" daily)   Diet/type: TPN, dysphagia diet DVT prophylaxis: SCD and heparin. GI prophylaxis: PPI Lines: N/A Foley:  N/A Code Status:  full code Last date of multidisciplinary goals of care discussion [spoke with daughter 8/2]    Jacky Kindle, MD [facility] See Amion for pager If no response to pager, please call [phone] until 7pm After 7pm, Please call E-link [phone]

## 2022-01-26 NOTE — Progress Notes (Signed)
PHARMACY - TOTAL PARENTERAL NUTRITION CONSULT NOTE  Indication: Prolonged ileus  Patient Measurements: Height: 5' (152.4 cm) Weight: 42.4 kg (93 lb 7.6 oz) IBW/kg (Calculated) : 45.5 TPN AdjBW (KG): 42.2 Body mass index is 18.26 kg/m. Usual weight:  240 lbs in 2017, now 88 lbs with significant weight loss over the past 3 yrs  Assessment:  41 YOF with history of polysubstance abuse and bariatric surgery presented on 7/23 s/p intentional overdose.  CT and abd XR showed large amount of stool in the colon and an ileus. She has severe PCM with minimal nutrition since admit (TF from 7/23 until 7/25), so Pharmacy consulted to manage TPN.  Pt removed CorTrak on 8/4 and TFs were discontinued at 1639. Pt passed swallow eval for Dysphagia 2 diet. Pt has eaten ~25-50% of her meals. Dietician is performing a calorie count.  Glucose / Insulin: hx DM on ?metformin PTA - hypoglycemic x2 on 7/29 prior to TPN initiation, now CBGs increased 137-301. Used 27 units on sensitive SSI in the past 24 hrs. Glargine increased to 16 units daily. BG trend improving. Electrolytes: Na 131, Cl 93, CoCa 9.7, others WNL Renal: SCr 0.32 -stable, BUN 11-stable Hepatic: LFTs mildly elevated/rising, tbili/TG WNL, albumin 1.6 Intake / Output; MIVF: UOP 2.4 ml/kg/hr, BM x 2 on 8/5 per RN report - lactulose held since 8/3, no overt volume overload noted on exam but net +13L with wet lungs - giving Lasix Vitamin/Mineral panel: low Vit D/A/C.  Vitamins B1/B12, copper, zinc and folate WNL. GI Imaging:  8/2 CT Abd/P: pleural effusions, B/L renal collecting system dilatation with concern of cystitis GI Surgeries / Procedures: none since TPN initiation   Central access: PICC placed 01/17/22 TPN start date: 01/18/22  Nutritional Goals: RD Estimated Needs Total Energy Estimated Needs: 1600-1800 kcals Total Protein Estimated Needs: 100-115 g Total Fluid Estimated Needs: >/= 1.6 L  Current Nutrition:  Soft diet and TPN  Plan:   Continue TPN to goal rate of 70 ml/hr at 1800 to provide 86g AA, 244g CHO and 44g ILE for a total of 1607 kCal, meeting 100% of needs Electrolytes in TPN: Na 45 mEq/L, K 55 mEq/L, Ca 5 mEq/L, Mg 5 mEq/L, Phos 12 mmol/L, EZ:MOQHUTM ratio 1:1 Add standard MVI and trace elements to TPN  Change moderate SSI frequency to TID AC Increase insulin glargine to 22units daily (Will give 6 units in addition to 16 units given this AM) -- will NOT put any insulin in TPN at this time and monitor Standard TPN labs on Mon and Thurs CCM considering GI consult for decompressive flex sig - f/u Started Vit A, D and C supplementation on 8/5    > Vitamin A 100,000 units IM daily x 3 days, followed by 50,000 units IM daily x 14 days, followed by 10,000 units PO daily x 60 days   > Vitamin D2 (ergocalciferol) 50,000 units PO once weekly x 8 weeks   > Vitamin C (ascorbic acid) '500mg'$  PO daily x28 days F/u on PO diet intake, calorie count per dietician, and plan for weaning TPN.  Luisa Hart, PharmD, BCPS Clinical Pharmacist 01/26/2022 10:51 AM   Please refer to [facility] for pharmacy phone number

## 2022-01-27 ENCOUNTER — Inpatient Hospital Stay (HOSPITAL_COMMUNITY): Payer: Medicare Other

## 2022-01-27 DIAGNOSIS — T50902D Poisoning by unspecified drugs, medicaments and biological substances, intentional self-harm, subsequent encounter: Secondary | ICD-10-CM | POA: Diagnosis not present

## 2022-01-27 LAB — COMPREHENSIVE METABOLIC PANEL
ALT: 87 U/L — ABNORMAL HIGH (ref 0–44)
AST: 56 U/L — ABNORMAL HIGH (ref 15–41)
Albumin: 1.8 g/dL — ABNORMAL LOW (ref 3.5–5.0)
Alkaline Phosphatase: 89 U/L (ref 38–126)
Anion gap: 9 (ref 5–15)
BUN: 10 mg/dL (ref 6–20)
CO2: 30 mmol/L (ref 22–32)
Calcium: 8.4 mg/dL — ABNORMAL LOW (ref 8.9–10.3)
Chloride: 93 mmol/L — ABNORMAL LOW (ref 98–111)
Creatinine, Ser: 0.38 mg/dL — ABNORMAL LOW (ref 0.44–1.00)
GFR, Estimated: >60 mL/min (ref >60)
Glucose, Bld: 268 mg/dL — ABNORMAL HIGH (ref 70–99)
Potassium: 4.1 mmol/L (ref 3.5–5.1)
Sodium: 132 mmol/L — ABNORMAL LOW (ref 135–145)
Total Bilirubin: 0.2 mg/dL — ABNORMAL LOW (ref 0.3–1.2)
Total Protein: 7.9 g/dL (ref 6.5–8.1)

## 2022-01-27 LAB — GLUCOSE, CAPILLARY
Glucose-Capillary: 158 mg/dL — ABNORMAL HIGH (ref 70–99)
Glucose-Capillary: 234 mg/dL — ABNORMAL HIGH (ref 70–99)
Glucose-Capillary: 238 mg/dL — ABNORMAL HIGH (ref 70–99)
Glucose-Capillary: 251 mg/dL — ABNORMAL HIGH (ref 70–99)
Glucose-Capillary: 301 mg/dL — ABNORMAL HIGH (ref 70–99)
Glucose-Capillary: 336 mg/dL — ABNORMAL HIGH (ref 70–99)

## 2022-01-27 LAB — MAGNESIUM: Magnesium: 1.8 mg/dL (ref 1.7–2.4)

## 2022-01-27 LAB — TRIGLYCERIDES: Triglycerides: 39 mg/dL (ref <150)

## 2022-01-27 LAB — PHOSPHORUS: Phosphorus: 4.5 mg/dL (ref 2.5–4.6)

## 2022-01-27 MED ORDER — ORAL CARE MOUTH RINSE: 15.0000 mL | OROMUCOSAL | Status: DC | PRN

## 2022-01-27 MED ORDER — LINACLOTIDE 145 MCG PO CAPS
145.0000 ug | ORAL_CAPSULE | Freq: Every day | ORAL | Status: DC
Start: 2022-01-27 — End: 2022-02-12
  Administered 2022-01-27 – 2022-02-12 (×15): 145 ug via ORAL
  Filled 2022-01-27 (×19): qty 1

## 2022-01-27 MED ORDER — FUROSEMIDE 10 MG/ML IJ SOLN
40.0000 mg | Freq: Two times a day (BID) | INTRAMUSCULAR | Status: AC
Start: 2022-01-27 — End: 2022-01-27
  Administered 2022-01-27 (×2): 40 mg via INTRAVENOUS
  Filled 2022-01-27 (×2): qty 4

## 2022-01-27 MED ORDER — ENSURE ENLIVE PO LIQD
237.0000 mL | Freq: Two times a day (BID) | ORAL | Status: DC
  Administered 2022-01-27 – 2022-01-29 (×5): 237 mL via ORAL

## 2022-01-27 MED ORDER — ENSURE ENLIVE PO LIQD
237.0000 mL | Freq: Two times a day (BID) | ORAL | Status: DC
Start: 2022-01-27 — End: 2022-01-27

## 2022-01-27 MED ORDER — METOCLOPRAMIDE HCL 5 MG/ML IJ SOLN
5.0000 mg | Freq: Four times a day (QID) | INTRAMUSCULAR | Status: DC
  Administered 2022-01-27 – 2022-02-08 (×45): 5 mg via INTRAVENOUS
  Filled 2022-01-27 (×43): qty 2

## 2022-01-27 MED ORDER — MAGNESIUM SULFATE 2 GM/50ML IV SOLN
2.0000 g | Freq: Once | INTRAVENOUS | Status: AC
  Administered 2022-01-27: 2 g via INTRAVENOUS
  Filled 2022-01-27: qty 50

## 2022-01-27 MED ORDER — TRAVASOL 10 % IV SOLN
INTRAVENOUS | Status: AC
  Filled 2022-01-27: qty 856.8

## 2022-01-27 MED ORDER — SIMETHICONE 40 MG/0.6ML PO SUSP
40.0000 mg | Freq: Four times a day (QID) | ORAL | Status: DC
  Administered 2022-01-27 – 2022-01-30 (×10): 40 mg via ORAL
  Filled 2022-01-27 (×17): qty 0.6

## 2022-01-27 MED ORDER — ORAL CARE MOUTH RINSE
15.0000 mL | OROMUCOSAL | Status: DC
  Administered 2022-01-28 – 2022-02-12 (×32): 15 mL via OROMUCOSAL

## 2022-01-27 NOTE — Progress Notes (Addendum)
PHARMACY - TOTAL PARENTERAL NUTRITION CONSULT NOTE  Indication: Prolonged ileus  Patient Measurements: Height: 5' (152.4 cm) Weight: 42.5 kg (93 lb 11.1 oz) IBW/kg (Calculated) : 45.5 TPN AdjBW (KG): 42.2 Body mass index is 18.3 kg/m. Usual weight:  240 lbs in 2017, now 88 lbs with significant weight loss over the past 3 yrs  Assessment:  49 YOF with history of polysubstance abuse and bariatric surgery presented on 7/23 s/p intentional overdose.  CT and abd XR showed large amount of stool in the colon and an ileus. She has severe PCM with minimal nutrition since admit (TF from 7/23 until 7/25), so Pharmacy consulted to manage TPN.  Pt removed CorTrak on 8/4 and TFs were discontinued at 1639. Pt passed swallow eval for Dysphagia 2 diet. Pt has eaten ~25-50% of her meals. Dietician is performing a calorie count.  Glucose / Insulin: hx DM on ?metformin PTA - hypoglycemic x2 on 7/29 prior to TPN initiation, now CBGs increased 137-301. Used 14 units on sensitive SSI in the past 24 hrs. Glargine increased to 22 units daily. BG trend improving. Electrolytes: Na 132, Cl 93, CoCa 9.7, others WNL Renal: SCr 0.38 -stable, BUN 10-stable Hepatic: LFTs mildly elevated/rising, tbili/TG WNL, albumin 1.8 Intake / Output; MIVF: UOP 3.8 ml/kg/hr, BM x 1 on 8/7 per RN report - lactulose held since 8/3, no overt volume overload noted on exam but net +13L with wet lungs - giving Lasix Vitamin/Mineral panel: low Vit D/A/C.  Vitamins B1/B12, copper, zinc and folate WNL. GI Imaging:  8/2 CT Abd/P: pleural effusions, B/L renal collecting system dilatation with concern of cystitis GI Surgeries / Procedures: none since TPN initiation   Central access: PICC placed 01/17/22 TPN start date: 01/18/22  Nutritional Goals: RD Estimated Needs Total Energy Estimated Needs: 1600-1800 kcals Total Protein Estimated Needs: 100-115 g Total Fluid Estimated Needs: >/= 1.6 L  Current Nutrition:  Soft diet and TPN  Plan:   Continue TPN to goal rate of 70 ml/hr at 1800 to provide 86g AA, 244g CHO and 44g ILE for a total of 1607 kCal, meeting 100% of needs Electrolytes in TPN: Na 50 mEq/L, K 55 mEq/L, Ca 5 mEq/L, Mg 7 mEq/L, Phos 10 mmol/L, OY:DXAJOIN ratio 1:1 Add standard MVI and trace elements to TPN  Change moderate SSI frequency to TID AC Keep insulin glargine at 22units daily Standard TPN labs on Mon and Thurs CCM considering GI consult for decompressive flex sig - f/u Started Vit A, D and C supplementation on 8/5    > Vitamin A 100,000 units IM daily x 3 days, followed by 50,000 units IM daily x 14 days, followed by 10,000 units PO daily x 60 days   > Vitamin D2 (ergocalciferol) 50,000 units PO once weekly x 8 weeks   > Vitamin C (ascorbic acid) '500mg'$  PO daily x28 days F/u on PO diet intake, calorie count per dietician, and plan for weaning TPN.  Alanda Slim, PharmD, [facility] Clinical Pharmacist Please see AMION for all Pharmacists' Contact Phone Numbers 01/27/2022, 12:41 PM

## 2022-01-27 NOTE — Progress Notes (Signed)
Occupational Therapy Treatment Patient Details Name: [name] MRN: [id number] DOB: [birth year] Today's Date: 01/27/2022   History of present illness 56 y.o. female admitted 01/12/22 after suicide attempt with intentional promethazine OD. EEG without seizures; showed evidence of epileptogenicity. ETT 7/23-7/25. Reintubated 7/26. Ileus. 7/30 extubated but then trached and returned to vent. PMHx:Afib, COPD, DM, neuropathy, HTN, bipolar disorder, schizophrenia, anxiety, depression, ETOH abuse.   OT comments  Upon arrival, pt with large, runny BM at bedlevel and NT needing assistance for peri care and linen change. Pt following cues to roll in bed and requiring Max A for peri care. Pt requiring Max A to change gown at EOB. Pt then performing functional mobility in hallway with Min A and RW. Upon return to room, pt with bowel urgency and needing to use BSC; requiring Max A for peri care. Despite fatigue, pt motivated to participate in therapy. Continue to recommend dc to AIR for intensive therapy and will continue to follow acutely as admitted.    Recommendations for follow up therapy are one component of a multi-disciplinary discharge planning process, led by the attending physician.  Recommendations may be updated based on patient status, additional functional criteria and insurance authorization.    Follow Up Recommendations  Acute inpatient rehab (3hours/day)    Assistance Recommended at Discharge Frequent or constant Supervision/Assistance  Patient can return home with the following  Two people to help with walking and/or transfers;Two people to help with bathing/dressing/bathroom   Equipment Recommendations  Other (comment) (Defer to next venue)    Recommendations for Other Services      Precautions / Restrictions Precautions Precautions: Fall;Other (comment) Precaution Comments: trach, cortrak, intermittent vent, watch sats       Mobility Bed Mobility Overal bed mobility: Needs  Assistance Bed Mobility: Rolling, Sidelying to Sit Rolling: Min guard Sidelying to sit: Min guard       General bed mobility comments: Min Guard A for safety    Transfers Overall transfer level: Needs assistance Equipment used: Rolling walker (2 wheels) Transfers: Sit to/from Stand Sit to Stand: Min guard           General transfer comment: Min Guard A for safety     Balance Overall balance assessment: Needs assistance Sitting-balance support: No upper extremity supported, Feet supported Sitting balance-Leahy Scale: Good     Standing balance support: During functional activity, Bilateral upper extremity supported Standing balance-Leahy Scale: Poor Standing balance comment: RW for gait                           ADL either performed or assessed with clinical judgement   ADL Overall ADL's : Needs assistance/impaired                 Upper Body Dressing : Maximal assistance;Sitting Upper Body Dressing Details (indicate cue type and reason): donning new gown     Toilet Transfer: Min guard;Ambulation;BSC/3in1;Rolling walker (2 wheels)   Toileting- Clothing Manipulation and Hygiene: Maximal assistance;Sit to/from stand;Sitting/lateral lean       Functional mobility during ADLs: Minimal assistance;Rolling walker (2 wheels) General ADL Comments: Upon arrival, pt in bedpan with NT needing assistance to roll. Pt with bowel incontenince and runny BM. Pt requiring Max A for peri care and then performing functional mobility in hallway with RW and Min A. Needing to trasnfer back to 3n1 for further toileting    Extremity/Trunk Assessment Upper Extremity Assessment Upper Extremity Assessment: Generalized weakness   Lower  Extremity Assessment Lower Extremity Assessment: Generalized weakness        Vision       Perception     Praxis      Cognition Arousal/Alertness: Awake/alert Behavior During Therapy: Flat affect Overall Cognitive Status:  Impaired/Different from baseline Area of Impairment: Safety/judgement, Attention, Following commands, Problem solving                   Current Attention Level: Sustained Memory: Decreased short-term memory   Safety/Judgement: Decreased awareness of safety, Decreased awareness of deficits   Problem Solving: Slow processing General Comments: Requiring increased time and cues thorughout.        Exercises      Shoulder Instructions       General Comments 6L O2 via trach. VSS    Pertinent Vitals/ Pain       Pain Assessment Pain Assessment: 0-10 Faces Pain Scale: Hurts little more Pain Descriptors / Indicators: Aching, Guarding Pain Intervention(s): Monitored during session, Limited activity within patient's tolerance, Repositioned  Home Living                                          Prior Functioning/Environment              Frequency  Min 2X/week        Progress Toward Goals  OT Goals(current goals can now be found in the care plan section)  Progress towards OT goals: Progressing toward goals  Acute Rehab OT Goals OT Goal Formulation: Patient unable to participate in goal setting Time For Goal Achievement: 01/31/22 Potential to Achieve Goals: Fair ADL Goals Additional ADL Goal #1: pt will follow 1 step command 50% of session Additional ADL Goal #2: pt will demonstrate sustained attention for 30 seconds to any adl task  Plan Discharge plan remains appropriate    Co-evaluation                 AM-PAC OT "6 Clicks" Daily Activity     Outcome Measure   Help from another person eating meals?: Total Help from another person taking care of personal grooming?: Total Help from another person toileting, which includes using toliet, bedpan, or urinal?: A Lot Help from another person bathing (including washing, rinsing, drying)?: Total Help from another person to put on and taking off regular upper body clothing?: Total Help from  another person to put on and taking off regular lower body clothing?: Total 6 Click Score: 7    End of Session Equipment Utilized During Treatment: Oxygen (6L via trach)  OT Visit Diagnosis: Unsteadiness on feet (R26.81);Muscle weakness (generalized) (M62.81)   Activity Tolerance Patient tolerated treatment well   Patient Left in chair;with chair alarm set;with nursing/sitter in room   Nurse Communication Mobility status        Time: 9767-3419 OT Time Calculation (min): 31 min  Charges: OT General Charges $OT Visit: 1 Visit OT Treatments $Self Care/Home Management : 23-37 mins  Lennis Korb MSOT, OTR/L Acute Rehab Office: [place] 01/27/2022, 5:19 PM

## 2022-01-27 NOTE — Inpatient Diabetes Management (Signed)
Inpatient Diabetes Program Recommendations  AACE/ADA: New Consensus Statement on Inpatient Glycemic Control (2015)  Target Ranges:  Prepandial:   less than 140 mg/dL      Peak postprandial:   less than 180 mg/dL (1-2 hours)      Critically ill patients:  140 - 180 mg/dL   Lab Results  Component Value Date   GLUCAP 158 (H) 01/27/2022   HGBA1C 7.4 (H) 11/14/2021    Review of Glycemic Control  Latest Reference Range & Units 01/26/22 16:23 01/26/22 20:24 01/26/22 23:51 01/27/22 04:33 01/27/22 07:45 01/27/22 10:58  Glucose-Capillary 70 - 99 mg/dL 105 (H) 191 (H) 236 (H) 251 (H) 336 (H) 158 (H)   Diabetes history: DM 2 Outpatient Diabetes medications:  Novolog SSI (ordered but never picked up) Metformin 500 mg bid Current orders for Inpatient glycemic control:  TPN 70 cc/ hr Semglee 22 units daily Novolog 0-15 units tid with meals Inpatient Diabetes Program Recommendations:    Please change Novolog correction to q 4 hours (since patient is on continuous TPN).    Thanks,  Adah Perl, RN, BC-ADM Inpatient Diabetes Coordinator Pager [phone]  (8a-5p)

## 2022-01-27 NOTE — Progress Notes (Signed)
Calorie Count Note  48 hour calorie count ordered.  Continue calorie count today. Discussed with RN.  -  Add magic cup to Breakfast -  Ensure Enlive BID (1400 and 2000)   Diet: Dysphagia 2 with Nectar thickened liquids Supplements: TPN  8/5-  Breakfast: No data collected Lunch: 405 kcal 16 grams protein Dinner: 630 kcal 17 grams protein  8/6:  Breakfast: 720 kcal 23 grams protein Lunch: 640 kcal 23 grams protein Dinner: No data collected  Per RNs and sitter pt ate well at Breakfast. Pt appears to be eating 100% of most items given at Breakfast. Pt tends to eat 25-50% of entree for lunch and dinner however pt has been eating the magic cup at these meals which greatly impacts kcal and protein at these meals.    Average total intake: 1200 kcal (65% of minimum estimated needs)  40 protein (40% of minimum estimated needs)   Lockie Pares., RD, LDN, CNSC See AMiON for contact information

## 2022-01-27 NOTE — Progress Notes (Signed)
NAME:  [name], MRN:  [id number], DOB:  31-Dec-1965, LOS: 67 ADMISSION DATE:  01/12/2022, CONSULTATION DATE:  01/12/21 REFERRING MD:  Sedonia Small - EM, CHIEF COMPLAINT:  Intentional Overdose    History of Present Illness:  56 yo F PMH schizoaffective disorder (bipolar type), Depression, prior SI, polysubstance use disorder, COPD, HTN, DM presented to the ED following suicide attempt. Patient had been drinking, while with friends stated that she was going to kill herself and was witnessed ingesting a bottle of promethazine.   In transport with EMS became more somnolent. Arrived to ED with GCS 3 and was intubated in this setting. EM discussed case with poison control who recommended supportive care  PCCM consulted for admission in this setting.  Pertinent Medical History:  Schizoaffective disorder, bipolar type Depression Polysubstance abuse  COPD HTN DM    Significant Hospital Events: Including procedures, antibiotic start and stop dates in addition to other pertinent events   7/23 ED after etoh consumption and intentional promethazine overdose. Intubated. Admit to ICU  7/24 Tachycardic to 130s overnight, also with episode of whole body stiffening, ?dystonia vs. seizure. EEG with irritability, LTM initiated. Neuro consulted. 7/25 Extubated, intermittently somnolent/agitated, not managing secretions well. O2 sats stable on [place]. Cortrak difficult to flush. 7/26 Reintubated early AM for hypoxia, airway protection. Unasyn initiated for ?aspiration PNA. LTM EEG discontinued. MRI pending. 7/27 MRI no acute changes. Wakes up follows commands on .7 dex  7/28 off NE  01/19/2022>> Trach after goals of care discussion with daughter 7/31 Transitioned to ATC.  8/2 CT abdomen: Stool in right colon.  Normal appendix.  Placed gastric bypass surgery.  Nasogastric tube in stomach.  No evidence of wall thickening or obstruction and bowel.  Questionable cystitis.  Bibasilar pleural effusions.  Ceftriaxone  started.  Interim History / Subjective:  No events. Abd still bothering her with pain.  Objective:  Blood pressure 136/76, pulse (!) 109, temperature 98 F (36.7 C), temperature source Oral, resp. rate (!) 36, height 5' (1.524 m), weight 42.5 kg, last menstrual period 01/08/2011, SpO2 96 %.    FiO2 (%):  [35 %] 35 %   Intake/Output Summary (Last 24 hours) at 01/27/2022 0848 Last data filed at 01/27/2022 0800 Gross per 24 hour  Intake 2073.17 ml  Output 4075 ml  Net -2001.83 ml    Filed Weights   01/25/22 0437 01/26/22 0450 01/27/22 0500  Weight: 42.5 kg 42.4 kg 42.5 kg   Physical Examination: No distress Abdomen TTP worse in LUQ, stable Lungs clear Ext with muscle wasting PICC CDI Moves ext to command  Resolved Hospital Problem List:   Hypotension, suspect sedation related   Acute toxic encephalopathy in the setting of drug overdose Hypokalemia  Assessment & Plan:  Acute toxic metabolic encephalopathy, initially in setting of intentional overdose- Mental status back to normal Seizure disorder SI with intentional promethazine overdose Schizoaffective disorder -- Bipolar type MDD  Polysubstance use disorder- Phenobarb taper completed. -Continue Keppra 500 mg twice daily -Continue Haldol and Ativan twice daily -Psychiatry is following, appreciate help, eventual need for IVC  Acute respiratory failure with hypoxia initially in setting of overdose, then 2/2 aspiration s/p trach Completed antibiotic therapy - Continue standing lasix - Has been on TC for 48h. Should be okay to transfer to step down.  Tobacco use disorder - Counseling provided regarding quit smoking COPD, not exacerbation Continue nebs as needed  Ongoing abdominal pain, ileus- now having BM; this is all related to anticholinergic effect of promethazine overdose -  Check KUB - Start standing reglan, linzess, simethicone - Continue lactulose - AM EKG given multiple QT prolonging agents  Iron deficiency  anemia - Monitor H&H - Continue iron supplements  DM2 with hyperglycemia Continue to be labile with TPN, will keep chasing, appreciate pharmD help   Best Practice (right click and "Reselect all SmartList Selections" daily)   Diet/type: TPN, dysphagia diet DVT prophylaxis: SCD and heparin. GI prophylaxis: PPI Lines: N/A Foley:  N/A Code Status:  full code Last date of multidisciplinary goals of care discussion [spoke with daughter 8/2]    Erskine Emery MD PCCM [facility] See Amion for pager If no response to pager, please call [phone] until 7pm After 7pm, Please call E-link [phone]

## 2022-01-27 NOTE — TOC Progression Note (Addendum)
Transition of Care [facility]) - Progression Note    Patient Details  Name: [name] MRN: [id number] Date of Birth: [birth year]  Transition [facility] CM/SW [place], [place] Phone Number: 01/27/2022, 12:09 PM  Clinical Narrative:     Patient has been re IVC'd on 8/7. CSW received consult for substance use. CSW spoke with patient and  with patients permission patients mother at bedside. CSW offered patient outpatient substance use treatment services resources.Patient request for CSW to speak with her mother at bedside. CSW spoke with patients mother at bedside. All questions answered regarding resources and current status of dc plan for inpatient psych placement for patient when patient is medically ready for dc. No further questions reported at this time. Patients mother accepted resources for patient.Patients daughter request letter from MD writing reason for admission and patients current state that is needed for social security office.  MD completed requested letter. CSW provided patients daughter with letter requested. Patients daughter accepted.CSW will continue to follow and assist with patients dc planning needs.  Expected Discharge Plan:  (inpatient psych) Barriers to Discharge: Continued Medical Work up  Expected Discharge Plan and Services Expected Discharge Plan:  (inpatient psych) In-house Referral: Clinical Social Work Discharge Planning Services: CM Consult   Living arrangements for the past 2 months: [place] Determinants of Health (SDOH) Interventions    Readmission Risk Interventions    07/27/2020   11:06 AM  Readmission Risk Prevention Plan  Transportation Screening Complete  PCP or Specialist Appt within 5-7 Days Complete  Home Care Screening Complete

## 2022-01-27 NOTE — Progress Notes (Signed)
Physical Therapy Treatment Patient Details Name: [name] MRN: [id number] DOB: 1965-06-28 Today's Date: 01/27/2022   History of Present Illness 56 y.o. female admitted 01/12/22 after suicide attempt with intentional promethazine OD. EEG without seizures; showed evidence of epileptogenicity. ETT 7/23-7/25. Reintubated 7/26. Ileus. 7/30 extubated but then trached and returned to vent. PMHx:Afib, COPD, DM, neuropathy, HTN, bipolar disorder, schizophrenia, anxiety, depression, ETOH abuse.    PT Comments    Pt with limited progression in gait on 30% FiO2. Pt received ativan this am and having trouble initially maintaining attention but during gait appeared more confused and agitated than prior sessions. Pt able to perform limited HEP and deferred further due cognition. Will continue to follow.     Recommendations for follow up therapy are one component of a multi-disciplinary discharge planning process, led by the attending physician.  Recommendations may be updated based on patient status, additional functional criteria and insurance authorization.  Follow Up Recommendations  Skilled nursing-short term rehab (<3 hours/day) Can patient physically be transported by private vehicle: Yes   Assistance Recommended at Discharge Frequent or constant Supervision/Assistance  Patient can return home with the following Assist for transportation;Help with stairs or ramp for entrance;Assistance with cooking/housework;Direct supervision/assist for medications management;Direct supervision/assist for financial management;A little help with walking and/or transfers;A little help with bathing/dressing/bathroom   Equipment Recommendations  Rolling walker (2 wheels);BSC/3in1    Recommendations for Other Services       Precautions / Restrictions Precautions Precautions: Fall;Other (comment) Precaution Comments: trach, cortrak, intermittent vent, watch sats     Mobility  Bed Mobility Overal bed  mobility: Needs Assistance Bed Mobility: Supine to Sit     Supine to sit: Min guard, HOB elevated     General bed mobility comments: HOB 30 degrees with guarding for lines as pt impulsive and trying to get up before lines moved    Transfers Overall transfer level: Needs assistance   Transfers: Sit to/from Stand Sit to Stand: Min guard           General transfer comment: guarding for lines and safety    Ambulation/Gait Ambulation/Gait assistance: Min assist Gait Distance (Feet): 325 Feet Assistive device: Rolling walker (2 wheels) Gait Pattern/deviations: Step-through pattern, Decreased stride length   Gait velocity interpretation: 1.31 - 2.62 ft/sec, indicative of limited community ambulator   General Gait Details: pt on 30% trach collar at 4L throughout with inaccurate pleth and difficult to determine SPO2 grossly 90-94%. Cues for direction and pt easily agitated during gait with questions or direction   Stairs             Wheelchair Mobility    Modified Rankin (Stroke Patients Only)       Balance Overall balance assessment: Needs assistance   Sitting balance-Leahy Scale: Good Sitting balance - Comments: sitting without support   Standing balance support: Bilateral upper extremity supported, Reliant on assistive device for balance Standing balance-Leahy Scale: Fair Standing balance comment: RW for gait                            Cognition Arousal/Alertness: Awake/alert Behavior During Therapy: Flat affect Overall Cognitive Status: Impaired/Different from baseline Area of Impairment: Safety/judgement, Attention                   Current Attention Level: Sustained Memory: Decreased short-term memory Following Commands: Follows one step commands consistently Safety/Judgement: Decreased awareness of deficits, Decreased awareness of safety   Problem Solving:  Slow processing General Comments: pt appears more anxious today with  limited conversation and stating she wants to drink and have mom change tv channel. Pt agitated with having to stop during gait for SPO2 assessment        Exercises General Exercises - Lower Extremity Long Arc Quad: AROM, Both, Seated, 20 reps    General Comments        Pertinent Vitals/Pain Pain Assessment Pain Assessment: No/denies pain    Home Living                          Prior Function            PT Goals (current goals can now be found in the care plan section) Progress towards PT goals: Progressing toward goals    Frequency    Min 3X/week      PT Plan Current plan remains appropriate    Co-evaluation              AM-PAC PT "6 Clicks" Mobility   Outcome Measure  Help needed turning from your back to your side while in a flat bed without using bedrails?: A Little Help needed moving from lying on your back to sitting on the side of a flat bed without using bedrails?: A Little Help needed moving to and from a bed to a chair (including a wheelchair)?: A Little Help needed standing up from a chair using your arms (e.g., wheelchair or bedside chair)?: A Little Help needed to walk in hospital room?: A Little Help needed climbing 3-5 steps with a railing? : A Lot 6 Click Score: 17    End of Session Equipment Utilized During Treatment: Oxygen Activity Tolerance: Patient tolerated treatment well Patient left: in chair;with call bell/phone within reach;with nursing/sitter in room;with family/visitor present Nurse Communication: Mobility status PT Visit Diagnosis: Other abnormalities of gait and mobility (R26.89);Difficulty in walking, not elsewhere classified (R26.2);Muscle weakness (generalized) (M62.81)     Time: 7408-1448 PT Time Calculation (min) (ACUTE ONLY): 20 min  Charges:  $Gait Training: 8-22 mins                     Bayard Males, PT Acute Rehabilitation Services Office: [place] 01/27/2022, 1:29 PM

## 2022-01-28 ENCOUNTER — Inpatient Hospital Stay (HOSPITAL_COMMUNITY): Payer: Medicare Other

## 2022-01-28 DIAGNOSIS — T50902D Poisoning by unspecified drugs, medicaments and biological substances, intentional self-harm, subsequent encounter: Secondary | ICD-10-CM | POA: Diagnosis not present

## 2022-01-28 LAB — BASIC METABOLIC PANEL
Anion gap: 7 (ref 5–15)
BUN: 17 mg/dL (ref 6–20)
CO2: 28 mmol/L (ref 22–32)
Calcium: 8.3 mg/dL — ABNORMAL LOW (ref 8.9–10.3)
Chloride: 98 mmol/L (ref 98–111)
Creatinine, Ser: 0.32 mg/dL — ABNORMAL LOW (ref 0.44–1.00)
GFR, Estimated: >60 mL/min (ref >60)
Glucose, Bld: 186 mg/dL — ABNORMAL HIGH (ref 70–99)
Potassium: 4.2 mmol/L (ref 3.5–5.1)
Sodium: 133 mmol/L — ABNORMAL LOW (ref 135–145)

## 2022-01-28 LAB — IRON AND TIBC
Iron: 13 ug/dL — ABNORMAL LOW (ref 28–170)
Saturation Ratios: 5 % — ABNORMAL LOW (ref 10.4–31.8)
TIBC: 287 ug/dL (ref 250–450)
UIBC: 274 ug/dL

## 2022-01-28 LAB — CBC
HCT: 25.1 % — ABNORMAL LOW (ref 36.0–46.0)
Hemoglobin: 7.3 g/dL — ABNORMAL LOW (ref 12.0–15.0)
MCH: 20.4 pg — ABNORMAL LOW (ref 26.0–34.0)
MCHC: 29.1 g/dL — ABNORMAL LOW (ref 30.0–36.0)
MCV: 70.3 fL — ABNORMAL LOW (ref 80.0–100.0)
Platelets: 594 10*3/uL — ABNORMAL HIGH (ref 150–400)
RBC: 3.57 MIL/uL — ABNORMAL LOW (ref 3.87–5.11)
RDW: 29 % — ABNORMAL HIGH (ref 11.5–15.5)
WBC: 14.2 10*3/uL — ABNORMAL HIGH (ref 4.0–10.5)
nRBC: 0 % (ref 0.0–0.2)

## 2022-01-28 LAB — GLUCOSE, CAPILLARY
Glucose-Capillary: 91 mg/dL (ref 70–99)
Glucose-Capillary: 175 mg/dL — ABNORMAL HIGH (ref 70–99)
Glucose-Capillary: 268 mg/dL — ABNORMAL HIGH (ref 70–99)
Glucose-Capillary: 173 mg/dL — ABNORMAL HIGH (ref 70–99)
Glucose-Capillary: 191 mg/dL — ABNORMAL HIGH (ref 70–99)
Glucose-Capillary: 229 mg/dL — ABNORMAL HIGH (ref 70–99)

## 2022-01-28 LAB — VITAMIN B12: Vitamin B-12: 1070 pg/mL — ABNORMAL HIGH (ref 180–914)

## 2022-01-28 LAB — FERRITIN: Ferritin: 56 ng/mL (ref 11–307)

## 2022-01-28 LAB — TSH: TSH: 3.043 u[IU]/mL (ref 0.350–4.500)

## 2022-01-28 LAB — MAGNESIUM: Magnesium: 2 mg/dL (ref 1.7–2.4)

## 2022-01-28 LAB — BRAIN NATRIURETIC PEPTIDE: B Natriuretic Peptide: 20.6 pg/mL (ref 0.0–100.0)

## 2022-01-28 LAB — FOLATE: Folate: 9.9 ng/mL (ref >5.9)

## 2022-01-28 LAB — PROCALCITONIN: Procalcitonin: <0.1 ng/mL

## 2022-01-28 MED ORDER — GUAIFENESIN ER 600 MG PO TB12
600.0000 mg | ORAL_TABLET | Freq: Two times a day (BID) | ORAL | Status: DC
  Administered 2022-01-28 – 2022-02-12 (×31): 600 mg via ORAL
  Filled 2022-01-28 (×31): qty 1

## 2022-01-28 MED ORDER — REVEFENACIN 175 MCG/3ML IN SOLN
175.0000 ug | Freq: Every day | RESPIRATORY_TRACT | Status: DC
  Administered 2022-01-29 – 2022-02-10 (×11): 175 ug via RESPIRATORY_TRACT
  Filled 2022-01-28 (×16): qty 3

## 2022-01-28 MED ORDER — INSULIN ASPART 100 UNIT/ML IJ SOLN
0.0000 [IU] | INTRAMUSCULAR | Status: DC
  Administered 2022-01-28: 3 [IU] via SUBCUTANEOUS
  Administered 2022-01-28: 11 [IU] via SUBCUTANEOUS
  Administered 2022-01-28: 5 [IU] via SUBCUTANEOUS
  Administered 2022-01-28: 8 [IU] via SUBCUTANEOUS
  Administered 2022-01-29 – 2022-01-30 (×3): 3 [IU] via SUBCUTANEOUS

## 2022-01-28 MED ORDER — GLYCOPYRROLATE 0.2 MG/ML IJ SOLN
0.1000 mg | Freq: Two times a day (BID) | INTRAMUSCULAR | Status: DC
  Filled 2022-01-28: qty 1

## 2022-01-28 MED ORDER — FUROSEMIDE 40 MG PO TABS
40.0000 mg | ORAL_TABLET | Freq: Every day | ORAL | Status: DC
  Administered 2022-01-28 – 2022-01-31 (×4): 40 mg via ORAL
  Filled 2022-01-28 (×4): qty 1

## 2022-01-28 MED ORDER — TRAVASOL 10 % IV SOLN
INTRAVENOUS | Status: AC
  Filled 2022-01-28: qty 856.8

## 2022-01-28 MED ORDER — METOPROLOL TARTRATE 25 MG PO TABS
25.0000 mg | ORAL_TABLET | Freq: Two times a day (BID) | ORAL | Status: DC
  Administered 2022-01-28 – 2022-02-02 (×11): 25 mg via ORAL
  Filled 2022-01-28 (×11): qty 1

## 2022-01-28 NOTE — Progress Notes (Addendum)
Speech Language Pathology Treatment: Dysphagia;Passy Muir Speaking valve  Patient Details Name: [name] MRN: [id number] DOB: 1965-07-05 Today's Date: 01/28/2022 Time: 9381-0175 SLP Time Calculation (min) (ACUTE ONLY): 15 min  Assessment / Plan / Recommendation Clinical Impression  Pt fairly drowsy this session, she got some sedating meds recently.  Pt able to state a few short phrases with increased volume after cues for deeper breath with PMSV in place. RN reports she ate a little bit of breakfast, but didn't have trouble with it. Mother at bedside, SLP reiterated precautions, particularly PMSV placement during eating and drinking and use of thickened liquids. Pt did have some coughing after sips with SLP. LIkely she is a little too lethargic to eat and drink at this time. GIven waxing and waning mentation, more conservative diet is warranted. Will continue precautions and f/u.    HPI HPI: Pt is a 56 y.o. female admitted 01/12/22 after suicide attempt with intentional drug overdose of promethazine. ETT 7/23-7/25; reintubated for hypoxia 7/26- trach 7/30. ATC initiated on 7/31. EEG 7/31: moderate severe diffuse encephalopathy, nonspecific etiology; no seizures. Cortrak placed 7/31. PMH: Afib, polysubstance abuse, COPD, DM, neuropathy, HTN, bipolar disorder, schizophrenia, anxiety, depression, ETOH abuse.      SLP Plan         Recommendations for follow up therapy are one component of a multi-disciplinary discharge planning process, led by the attending physician.  Recommendations may be updated based on patient status, additional functional criteria and insurance authorization.    Recommendations  Diet recommendations: Dysphagia 2 (fine chop);Nectar-thick liquid Liquids provided via: Cup;Straw Medication Administration: Whole meds with liquid Supervision: Full supervision/cueing for compensatory strategies Compensations: Slow rate;Small sips/bites Postural Changes and/or Swallow  Maneuvers: Out of bed for meals      Patient may use Passy-Muir Speech Valve: Intermittently with supervision         Oral Care Recommendations: Oral care BID Follow Up Recommendations: Skilled nursing-short term rehab (<3 hours/day) Assistance recommended at discharge: Frequent or constant Supervision/Assistance SLP Visit Diagnosis: Dysphagia, oropharyngeal phase (R13.12)           Duron Meister, Katherene Ponto  01/28/2022, 11:22 AM

## 2022-01-28 NOTE — Progress Notes (Signed)
PHARMACY - TOTAL PARENTERAL NUTRITION CONSULT NOTE  Indication: Prolonged ileus  Patient Measurements: Height: 5' (152.4 cm) Weight: 42.9 kg (94 lb 9.2 oz) IBW/kg (Calculated) : 45.5 TPN AdjBW (KG): 42.2 Body mass index is 18.47 kg/m. Usual weight:  240 lbs in 2017, now 88 lbs with significant weight loss over the past 3 yrs  Assessment:  23 YOF with history of polysubstance abuse and bariatric surgery presented on 7/23 s/p intentional overdose.  CT and abd XR showed large amount of stool in the colon and an ileus. She has severe PCM with minimal nutrition since admit (TF from 7/23 until 7/25), so Pharmacy consulted to manage TPN.  Pt removed CorTrak on 8/4 and TFs were discontinued at 1639. Pt passed swallow eval for Dysphagia 2 diet. Pt has eaten ~25-50% of her meals. Dietician is performing a calorie count.  Glucose / Insulin: hx DM on ?metformin PTA - hypoglycemic x2 on 7/29 prior to TPN initiation, now CBGs increased 137-301. Used 14 units on sensitive SSI in the past 24 hrs. Glargine increased to 22 units daily. BG trend improving. - Will plan to put 25 units in TPN today Electrolytes: Na 133, Cl 93, CoCa 9.7, others WNL Renal: SCr 0.38 -stable, BUN 10-stable Hepatic: LFTs mildly elevated/rising, tbili/TG WNL, albumin 1.8 Intake / Output; MIVF: UOP 0.8 ml/kg/hr, BM x 5 on 8/7 per RN report - lactulose held since 8/3, no overt volume overload noted on exam but net +13L with wet lungs - giving Lasix Vitamin/Mineral panel: low Vit D/A/C.  Vitamins B1/B12, copper, zinc and folate WNL. GI Imaging:  8/2 CT Abd/P: pleural effusions, B/L renal collecting system dilatation with concern of cystitis GI Surgeries / Procedures: none since TPN initiation   Central access: PICC placed 01/17/22 TPN start date: 01/18/22  Nutritional Goals: RD Estimated Needs Total Energy Estimated Needs: 1600-1800 kcals Total Protein Estimated Needs: 100-115 g Total Fluid Estimated Needs: >/= 1.6 L  Current  Nutrition:  Soft diet and TPN  Plan:  Continue TPN to goal rate of 70 ml/hr to provide 86g AA, 244g CHO and 44g ILE for a total of 1607 kCal, meeting 100% of needs Electrolytes in TPN: Na 50 mEq/L, K 55 mEq/L, Ca 5 mEq/L, Mg 7 mEq/L, Phos 10 mmol/L, AJ:GOTLXBW ratio 2:1 - Add 25 units regular insulin to TPN Add standard MVI and trace elements to TPN  Change moderate SSI frequency to TID AC D/c insulin glargine  Standard TPN labs on Mon and Thurs Started Vit A, D and C supplementation on 8/5    > Vitamin A 100,000 units IM daily x 3 days, followed by 50,000 units IM daily x 14 days, followed by 10,000 units PO daily x 60 days   > Vitamin D2 (ergocalciferol) 50,000 units PO once weekly x 8 weeks   > Vitamin C (ascorbic acid) '500mg'$  PO daily x28 days F/u on PO diet intake, calorie count per dietician, and plan for weaning TPN.  Alanda Slim, PharmD, [facility] Clinical Pharmacist Please see AMION for all Pharmacists' Contact Phone Numbers 01/28/2022, 10:36 AM

## 2022-01-28 NOTE — Progress Notes (Signed)
Brief Nutrition Follow-up:  Pt only ate 10% at breakfast this AM. Upon visit during lunch, pt ate about 50% of magic cup, bites of ground meat and mashed potatoes. Pt has not had any Ensure yet today.   Pt still with abdominal pain; pt with heating pack on abdomen upon RD visit.   Intervention: Continue Calorie Count Continue TPN to meet 100% nutrition needs Continue Magic cup TID with meals, each supplement provides 290 kcal and 9 grams of protein Continue Ensure Enlive po BID, each supplement provides 350 kcal and 20 grams of protein.   Kerman Passey MS, RDN, LDN, CNSC Registered Dietitian 3 Clinical Nutrition RD Pager and On-Call Pager Number Located in [place]

## 2022-01-28 NOTE — Consult Note (Signed)
   56 yo F PMH schizoaffective disorder (bipolar type), Depression, prior SI, polysubstance use disorder, COPD, HTN, DM presented to the ED following suicide attempt. Patient had been drinking, while with friends stated that she was going to kill herself and was witnessed ingesting a bottle of promethazine.   In transport with EMS became more somnolent. Arrived to ED with GCS 3 and was intubated in this setting. EM discussed case with poison control who recommended supportive care   Psychiatry consulted due to suicide attempt by overdose.  Patient was recently trached 07/30, and currently trialing a trach collar  this morning. She continues to be open to psychiatric visitation, her mother and sitter are present at the bedside.    Patient is seen and assessed face to face by this nurse practitioner, chart reviewed, sitter present at bedside.  Patient is alert and oriented x4, calm and cooperative, and pleasant.  She reports improvement in anxiety.  She also notes improvement in sleep and appetite.  She is noted to have hot pack on lower abdomen, endorses some abdominal pain not as bad as before.  She has been able to eat more.   With the exception of her anxiety and insomnia, she denies any additional acute psychiatric symptoms at this time. She denies any suicidal ideations, homicidal ideations and or hallucinations.    She continues to demonstrate clear lucid thought and processes by way of mouthing words, lip syncing, and hand gestures.    Patient continues to meet inpatient psychiatric criteria and IVC.  No changes made at this time.  -WIll continue Haldol '2mg'$  po bid, '1mg'$  Ativan po bid for agitation, anxiety, and delirium. EKG obtained 08/04 , QTc 436.  Patient appears to be improving and responding to current psychiatric regimen. -WIll obtain labs and repeat EKG.  -Will consider starting antidepressant later this week, now that patient is stabilizing and improving from a medical standpoint.  Her  trach will be a barrier for placement -Continue IVC papers as she remains a risk to self.  Continue suicide sitter -Continue delirium precautions as directed.

## 2022-01-28 NOTE — H&P (Signed)
PROGRESS NOTE                                                                                                                                                                                                             Patient Demographics:    [name], is a 56 y.o. female, DOB - 1965/10/19, ERX:[id number]  Outpatient Primary MD for the patient is [name], MD    LOS - 51  Admit date - 01/12/2022    Chief Complaint  Patient presents with   Drug Overdose   Suicide Attempt       Brief Narrative (HPI from H&P)   56 yo F PMH schizoaffective disorder (bipolar type), Depression, prior SI, polysubstance use disorder, COPD, HTN, DM presented to the ED following suicide attempt. Patient had been drinking, while with friends stated that she was going to kill herself and was witnessed ingesting a bottle of promethazine and alcohol overdose.   In transport with EMS became more somnolent. Arrived to ED with GCS 3 and was intubated in this setting admitted by pulmonary critical care, debated but had problems with aspiration pneumonia, underwent tracheostomy placement on 01/19/2022.  Transition to trach collar on 01/20/2022.  She currently has issues with resolving ileus, intermittent urinary retention, recently treated UTI, intermittent fluid overload.  Still on trach collar but started on oral diet by speech therapy.   Current events and procedures 7/23 ED after etoh consumption and intentional promethazine overdose. Intubated. Admit to ICU  7/24 Tachycardic to 130s overnight, also with episode of whole body stiffening, ?dystonia vs. seizure. EEG with irritability, LTM initiated. Neuro consulted. 7/25 Extubated, intermittently somnolent/agitated, not managing secretions well. O2 sats stable on [place]. Cortrak difficult to flush. 7/26 Reintubated early AM for hypoxia, airway protection. Unasyn initiated for ?aspiration PNA. LTM EEG  discontinued. MRI pending. 7/27 MRI no acute changes. Wakes up follows commands on .7 dex  7/28 off NE  01/19/2022>> Trach after goals of care discussion with daughter 7/31 Transitioned to ATC.  8/2 CT abdomen: Stool in right colon.  Normal appendix.  Placed gastric bypass surgery.  Nasogastric tube in stomach.  No evidence of wall thickening or obstruction and bowel.  Questionable cystitis.  Bibasilar pleural effusions.  Ceftriaxone started. 01/28/2022.  Transferred to hospitalist service on day 16 of her hospital stay   Subjective:  [name] today has, No headache, No chest pain, no shortness of breath or cough, some abdominal discomfort, no focal weakness.   Assessment  & Plan :   Alcohol and promethazine overdose with likely attempted suicide with history of schizoaffective disorder bipolar type, history of polysubstance abuse.  Seen by psych, currently stable continue to monitor.  For now on supportive care.  On as needed Ativan and Haldol  ?? History of seizure disorder.  Currently on as needed Ativan.  EEG here nonacute.  Seen by neurology initially was on Keppra this was discontinued by neurology on 01/19/2022.  Colonic dysmotility with obstipation and some intermittent urinary retention.  On bowel regimen with bowel movements now, abdominal x-ray nonacute, some suprapubic fullness, checking bladder scans every shift for any signs of urinary retention.  Microcytic anemia.  Check anemia panel, type screen.  Inability to protect airway with subsequent acute hypoxic respiratory failure.  Required intubation and complicated by aspiration pneumonia-  all due to initial drug overdose and toxic encephalopathy.  Now has been trached, currently on trach collar, speech following, on dysphagia 2 diet along with IV TNA.  Currently not on any antibiotics.  Moderate protein calorie malnutrition.  On oral diet and IV TNA via right arm PICC line.  History of tobacco abuse and COPD.  No acute  issues counseled to quit tobacco and alcohol.  GERD.  On PPI.    DM type II.  On long-acting insulin along with sliding scale.  Lab Results  Component Value Date   HGBA1C 7.4 (H) 11/14/2021   CBG (last 3)  Recent Labs    01/28/22 0337 01/28/22 0715 01/28/22 0848  GLUCAP 91 175* 268*         Condition - Extremely Guarded  Family Communication  : None present  Code Status : Full code  Consults  : PCCM, neurology, psychiatry  PUD Prophylaxis : PPI       Disposition Plan  :    Status is: Inpatient DVT Prophylaxis  :    heparin injection 5,000 Units Start: 01/21/22 1400 Place and maintain sequential compression device Start: 01/18/22 0754 SCDs Start: 01/12/22 0900   Lab Results  Component Value Date   PLT 594 (H) 01/28/2022    Diet :  Diet Order             DIET DYS 2 Room service appropriate? Yes with Assist; Fluid consistency: Nectar Thick  Diet effective now                    Inpatient Medications  Scheduled Meds:  vitamin C  500 mg Oral Daily   bisacodyl  10 mg Rectal BID   Chlorhexidine Gluconate Cloth  6 each Topical Daily   cyanocobalamin  1,000 mcg Intramuscular Q7 days   docusate sodium  100 mg Oral BID   feeding supplement  237 mL Oral BID BM   furosemide  40 mg Oral Daily   guaiFENesin  600 mg Oral BID   haloperidol lactate  2 mg Intravenous BID   And   LORazepam  1 mg Intravenous BID   heparin injection (subcutaneous)  5,000 Units Subcutaneous Q8H   insulin aspart  0-15 Units Subcutaneous Q4H   insulin glargine-yfgn  22 Units Subcutaneous Daily   lactulose  30 g Oral TID   linaclotide  145 mcg Oral QAC breakfast   metoCLOPramide (REGLAN) injection  5 mg Intravenous Q6H   metoprolol tartrate  25 mg Oral BID  mouth rinse  15 mL Mouth Rinse 4 times per day   pantoprazole  40 mg Oral QHS   revefenacin  175 mcg Nebulization Daily   simethicone  40 mg Oral QID   sodium chloride flush  10-40 mL Intracatheter Q12H   vitamin A   50,000 Units Intramuscular Daily   Followed by   Derrill Memo ON 02/11/2022] vitamin A  10,000 Units Oral Daily   Vitamin D (Ergocalciferol)  50,000 Units Oral Q7 days   Continuous Infusions:  sodium chloride 10 mL/hr at 01/28/22 0800   TPN ADULT (ION) 70 mL/hr at 01/28/22 0800   PRN Meds:.acetaminophen, albuterol, food thickener, ondansetron (ZOFRAN) IV, mouth rinse, polyethylene glycol, sodium chloride flush  Antibiotics  :    Anti-infectives (From admission, onward)    Start     Dose/Rate Route Frequency Ordered Stop   01/22/22 1930  cefTRIAXone (ROCEPHIN) 1 g in sodium chloride 0.9 % 100 mL IVPB        1 g 200 mL/hr over 30 Minutes Intravenous Every 24 hours 01/22/22 1839 01/26/22 1957   01/15/22 0830  Ampicillin-Sulbactam (UNASYN) 3 g in sodium chloride 0.9 % 100 mL IVPB        3 g 200 mL/hr over 30 Minutes Intravenous Every 6 hours 01/15/22 0742 01/19/22 2054        Time Spent in minutes  30   Lala Lund M.D on 01/28/2022 at 9:58 AM  To page go to [email or web address]   [facility] -  Office  [phone]  See all Orders from today for further details    Objective:   Vitals:   01/28/22 0600 01/28/22 0700 01/28/22 0800 01/28/22 0826  BP: 126/70 111/79 129/75   Pulse: (!) 104 95 (!) 101 (!) 103  Resp: (!) 39 (!) 27 (!) 31 (!) 32  Temp:  98.4 F (36.9 C) 98.4 F (36.9 C)   TempSrc:  Axillary Axillary   SpO2: 93% 93% 93% 93%  Weight:      Height:        Wt Readings from Last 3 Encounters:  01/28/22 42.9 kg  11/15/21 42.2 kg  09/05/21 59 kg     Intake/Output Summary (Last 24 hours) at 01/28/2022 0958 Last data filed at 01/28/2022 1751 Gross per 24 hour  Intake 2644.9 ml  Output 900 ml  Net 1744.9 ml     Physical Exam  Awake Alert, No new F.N deficits, Normal affect [place].AT,PERRAL, trach collar in place, right arm PICC line in place Supple Neck, No JVD,   Symmetrical Chest wall movement, Good air movement bilaterally, CTAB RRR,No Gallops,Rubs or new  Murmurs,  +ve B.Sounds, Abd Soft, mild suprapubic fullness and tenderness No Cyanosis, Clubbing or edema      Data Review:    CBC Recent Labs  Lab 01/22/22 0357 01/24/22 1105 01/28/22 0635  WBC 15.5* 14.5* 14.2*  HGB 7.7* 7.5* 7.3*  HCT 26.5* 26.1* 25.1*  PLT 322 296 594*  MCV 69.9* 73.3* 70.3*  MCH 20.3* 21.1* 20.4*  MCHC 29.1* 28.7* 29.1*  RDW 27.0* 29.6* 29.0*  LYMPHSABS  --  1.1  --   MONOABS  --  0.7  --   EOSABS  --  0.1  --   BASOSABS  --  0.0  --     Electrolytes Recent Labs  Lab 01/23/22 0404 01/23/22 0842 01/24/22 0503 01/24/22 1449 01/25/22 0413 01/26/22 0416 01/27/22 0430 01/28/22 0635  NA 138 138 136  --  133* 131* 132* 133*  K  5.2* 4.2 3.7  --  4.1 4.2 4.1 4.2  CL 106 104 100  --  98 93* 93* 98  CO2 '27 27 29  '$ --  '30 28 30 28  '$ GLUCOSE 394* 276* 211*  --  179* 251* 268* 186*  BUN '10 9 10  '$ --  '11 10 10 17  '$ CREATININE 0.30* 0.38* <0.30*  --  0.44 0.32* 0.38* 0.32*  CALCIUM 8.1* 8.0* 8.4*  --  8.0* 7.8* 8.4* 8.3*  AST 58* 51*  --   --   --   --  56*  --   ALT 59* 63*  --   --   --   --  87*  --   ALKPHOS 59 58  --   --   --   --  89  --   BILITOT 0.3 0.4  --   --   --   --  0.2*  --   ALBUMIN 1.6* 1.6*  --   --   --   --  1.8*  --   MG 2.1  --  1.9  --  2.0 2.0 1.8 2.0  LATICACIDVEN  --   --   --  2.8*  --   --   --   --   BNP  --   --   --   --   --   --   --  20.6    ------------------------------------------------------------------------------------------------------------------ Recent Labs    01/27/22 0430  TRIG 39    ID Labs Recent Labs  Lab 01/22/22 0357 01/23/22 0404 01/24/22 0503 01/24/22 1105 01/24/22 1449 01/25/22 0413 01/26/22 0416 01/27/22 0430 01/28/22 0635  WBC 15.5*  --   --  14.5*  --   --   --   --  14.2*  PLT 322  --   --  296  --   --   --   --  594*  LATICACIDVEN  --   --   --   --  2.8*  --   --   --   --   CREATININE 0.33*   < > <0.30*  --   --  0.44 0.32* 0.38* 0.32*   < > = values in this interval not  displayed.      Radiology Reports DG Chest Port 1 View  Result Date: 01/28/2022 CLINICAL DATA:  Shortness of breath, check tracheostomy cannula. EXAM: PORTABLE CHEST 1 VIEW COMPARISON:  Portable chest 01/21/2022 FINDINGS: Right PICC again terminates at about the superior cavoatrial junction; tracheostomy cannula tip is 4.6 cm from the carina. Interval removal of feeding tube. There is stable cardiomegaly and mild central vascular and interstitial prominence consistent with mild interstitial edema. There is streaky atelectasis or infiltrates in both infrahilar areas. Please correlate clinically. The remaining lungs are generally clear. Today the pleural effusions do appear smaller than previously but in all other respects there are no further changes. IMPRESSION: 1. Persistent mild central vascular and interstitial prominence. 2. Small pleural effusions appear smaller than previously, with persistent bilateral infrahilar streaky atelectasis or pneumonia. 3. Support devices as above. Electronically Signed   By: Telford Nab M.D.   On: 01/28/2022 07:12   DG Abd Portable 1V  Result Date: 01/27/2022 CLINICAL DATA:  Ileus. EXAM: PORTABLE ABDOMEN - 1 VIEW COMPARISON:  January 20, 2022. FINDINGS: Feeding tube has been removed. Residual contrast is noted in the colon. No abnormal bowel dilatation is noted. IMPRESSION: No abnormal bowel dilatation. Electronically Signed   By:  Marijo Conception M.D.   On: 01/27/2022 09:19   DG Swallowing Func-Speech Pathology  Result Date: 01/24/2022 Table formatting from the original result was not included. Objective Swallowing Evaluation: Type of Study: MBS-Modified Barium Swallow Study  Patient Details Name: BLU MCGLAUN MRN: [id number] Date of Birth: 01-14-1966 Today's Date: 01/24/2022 Time: SLP Start Time (ACUTE ONLY): 48 -SLP Stop Time (ACUTE ONLY): 7253 SLP Time Calculation (min) (ACUTE ONLY): 20 min Past Medical History: Past Medical History: Diagnosis Date  Abdominal pain    Accidental drug overdose April 2013  Anxiety   Atrial fibrillation ([place]) 09/29/11  converted spontaneously  Chronic back pain   Chronic knee pain   Chronic nausea   Chronic pain   COPD (chronic obstructive pulmonary disease) ([place])   Depression   Diabetes mellitus   states her doctor took her off all DM meds in past month  Diabetic neuropathy ([place])   Dyspnea   with exertion   GERD (gastroesophageal reflux disease)   Headache(784.0)   migraines   HTN (hypertension)   not on meds since in a year   Hyperlipidemia   Hypothyroidism   not on meds in a while   Mental disorder   Bipolar and schizophrenic  Requires supplemental oxygen   as needed per patient   Schizophrenia ([place])   Schizophrenia, acute ([place]) 11/13/2017  Tobacco abuse  Past Surgical History: Past Surgical History: Procedure Laterality Date  ABDOMINAL HYSTERECTOMY    BLADDER SUSPENSION  03/04/2011  Procedure: [facility] PROCEDURE;  Surgeon: Elayne Snare MacDiarmid;  Location: [facility];  Service: Urology;  Laterality: N/A;  CYSTOCELE REPAIR  03/04/2011  Procedure: ANTERIOR REPAIR (CYSTOCELE);  Surgeon: Reece Packer;  Location: [facility];  Service: Urology;  Laterality: N/A;  CYSTOSCOPY  03/04/2011  Procedure: CYSTOSCOPY;  Surgeon: Elayne Snare MacDiarmid;  Location: [facility];  Service: Urology;  Laterality: N/A;  ESOPHAGOGASTRODUODENOSCOPY (EGD) WITH PROPOFOL N/A 05/12/2017  Procedure: ESOPHAGOGASTRODUODENOSCOPY (EGD) WITH PROPOFOL;  Surgeon: Alphonsa Overall, MD;  Location: [facility];  Service: General;  Laterality: N/A;  GASTRIC ROUX-EN-Y N/A 03/25/2016  Procedure: LAPAROSCOPIC ROUX-EN-Y GASTRIC BYPASS WITH UPPER ENDOSCOPY;  Surgeon: Excell Seltzer, MD;  Location: [facility];  Service: General;  Laterality: N/A;  KNEE SURGERY    LAPAROSCOPIC ASSISTED VAGINAL HYSTERECTOMY  03/04/2011  Procedure: LAPAROSCOPIC ASSISTED VAGINAL HYSTERECTOMY;  Surgeon: Cyril Mourning, MD;  Location: [facility];  Service: Gynecology;  Laterality: N/A; HPI: Pt is a 56 y.o. female admitted 01/12/22 after  suicide attempt with intentional drug overdose of promethazine. ETT 7/23-7/25; reintubated for hypoxia 7/26- trach 7/30. ATC initiated on 7/31. EEG 7/31: moderate severe diffuse encephalopathy, nonspecific etiology; no seizures. Cortrak placed 7/31. PMH: Afib, polysubstance abuse, COPD, DM, neuropathy, HTN, bipolar disorder, schizophrenia, anxiety, depression, ETOH abuse.  No data recorded  Recommendations for follow up therapy are one component of a multi-disciplinary discharge planning process, led by the attending physician.  Recommendations may be updated based on patient status, additional functional criteria and insurance authorization. Assessment / Plan / Recommendation   01/24/2022   3:49 PM Clinical Impressions Clinical Impression The was study was completed with PMSV on and subsequently off. Pt presents with oropharyngeal dysphagia characterized by reduced bolus cohesion, reduced anterior laryngeal movement, and a pharyngeal delay. She demonstrated mildly prolonged mastiation, and premature spillage to the valleculae. Penetration (PAS 3) was noted with large boluses of thin liquids via straw when the PMSV was donned. Laryngeal invasion of thin liquids progressed to aspiration (PAS 7) with the PMSV doffed. A dysphagia 2  diet with nectar thick liquids will be initiated at this time with observance of swallowing precautions including use of the PMSV during meals with full supervision. SLP will continue to follow pt and prognosis of advancement is judged to be good. SLP Visit Diagnosis Dysphagia, oropharyngeal phase (R13.12) Impact on safety and function Moderate aspiration risk     01/24/2022   3:49 PM Treatment Recommendations Treatment Recommendations Therapy as outlined in treatment plan below     01/24/2022   3:49 PM Prognosis Prognosis for Safe Diet Advancement Good   01/24/2022   3:49 PM Diet Recommendations SLP Diet Recommendations Dysphagia 2 (Fine chop) solids;Nectar thick liquid Liquid Administration via  Cup;Straw Medication Administration Whole meds with puree Compensations Slow rate;Small sips/bites Postural Changes Seated upright at 90 degrees     01/24/2022   3:49 PM Other Recommendations Follow Up Recommendations Skilled nursing-short term rehab (<3 hours/day) Assistance recommended at discharge Frequent or constant Supervision/Assistance Functional Status Assessment Patient has had a recent decline in their functional status and demonstrates the ability to make significant improvements in function in a reasonable and predictable amount of time.   01/24/2022   3:49 PM Frequency and Duration  Speech Therapy Frequency (ACUTE ONLY) min 2x/week Treatment Duration 2 weeks     01/24/2022   3:49 PM Oral Phase Oral Phase Impaired Oral - Nectar Cup WFL Oral - Nectar Straw WFL Oral - Thin Cup Decreased bolus cohesion;Premature spillage Oral - Thin Straw Decreased bolus cohesion;Premature spillage Oral - Puree WFL Oral - Regular Impaired mastication    01/24/2022   3:49 PM Pharyngeal Phase Pharyngeal Phase Impaired Pharyngeal- Nectar Cup Reduced anterior laryngeal mobility;Delayed swallow initiation-vallecula Pharyngeal- Nectar Straw Reduced anterior laryngeal mobility;Delayed swallow initiation-vallecula Pharyngeal- Thin Cup Reduced anterior laryngeal mobility;Delayed swallow initiation-vallecula;Penetration/Aspiration during swallow Pharyngeal Material enters airway, remains ABOVE vocal cords and not ejected out;Material enters airway, passes BELOW cords and not ejected out despite cough attempt by patient Pharyngeal- Thin Straw Reduced anterior laryngeal mobility;Delayed swallow initiation-vallecula;Penetration/Aspiration during swallow;Trace aspiration Pharyngeal Material enters airway, remains ABOVE vocal cords and not ejected out;Material enters airway, passes BELOW cords and not ejected out despite cough attempt by patient Pharyngeal- Puree Reduced anterior laryngeal mobility;Delayed swallow initiation-vallecula  Pharyngeal- Regular Reduced anterior laryngeal mobility Pharyngeal- Pill Reduced anterior laryngeal mobility;Delayed swallow initiation-vallecula    01/24/2022   3:49 PM Cervical Esophageal Phase  Cervical Esophageal Phase [facility] Shanika I. Hardin Negus, [place], [place] Office number [phone] Horton Marshall 01/24/2022, 3:58 PM

## 2022-01-28 NOTE — Progress Notes (Signed)
eLink Physician-Brief Progress Note Patient Name: [name] DOB: 1966-05-17 MRN: [id number]   Date of Service  01/28/2022  HPI/Events of Note  Hyperglycemia on TPN. Has SSI and glucose checks ordered 3x with meals   eICU Interventions  Changed SSI and glucose checks to every 4 hours instead.      Intervention Category Major Interventions: Hyperglycemia - active titration of insulin therapy  Linday Rhodes G Ayiden Milliman 01/28/2022, 12:30 AM

## 2022-01-28 NOTE — Progress Notes (Addendum)
NAME:  [name], MRN:  [id number], DOB:  03/21/1966, LOS: 71 ADMISSION DATE:  01/12/2022, CONSULTATION DATE:  01/12/21 REFERRING MD:  Sedonia Small - EM, CHIEF COMPLAINT:  Intentional Overdose    History of Present Illness:  56 yo F PMH schizoaffective disorder (bipolar type), Depression, prior SI, polysubstance use disorder, COPD, HTN, DM presented to the ED following suicide attempt. Patient had been drinking, while with friends stated that she was going to kill herself and was witnessed ingesting a bottle of promethazine.   In transport with EMS became more somnolent. Arrived to ED with GCS 3 and was intubated in this setting. EM discussed case with poison control who recommended supportive care  PCCM consulted for admission in this setting.  Pertinent Medical History:  Schizoaffective disorder, bipolar type Depression Polysubstance abuse  COPD HTN DM    Significant Hospital Events: Including procedures, antibiotic start and stop dates in addition to other pertinent events   7/23 ED after etoh consumption and intentional promethazine overdose. Intubated. Admit to ICU  7/24 Tachycardic to 130s overnight, also with episode of whole body stiffening, ?dystonia vs. seizure. EEG with irritability, LTM initiated. Neuro consulted. 7/25 Extubated, intermittently somnolent/agitated, not managing secretions well. O2 sats stable on [place]. Cortrak difficult to flush. 7/26 Reintubated early AM for hypoxia, airway protection. Unasyn initiated for ?aspiration PNA. LTM EEG discontinued. MRI pending. 7/27 MRI no acute changes. Wakes up follows commands on .7 dex  7/28 off NE  01/19/2022>> Trach after goals of care discussion with daughter 7/31 Transitioned to ATC.  8/2 CT abdomen: Stool in right colon.  Normal appendix.  Placed gastric bypass surgery.  Nasogastric tube in stomach.  No evidence of wall thickening or obstruction and bowel.  Questionable cystitis.  Bibasilar pleural effusions.  Ceftriaxone  started.  Interim History / Subjective:  Secretion issues this am. Still intermittent abdominal discomfort.  Objective:  Blood pressure 129/75, pulse (!) 103, temperature 98.4 F (36.9 C), temperature source Axillary, resp. rate (!) 32, height 5' (1.524 m), weight 42.9 kg, last menstrual period 01/08/2011, SpO2 93 %.    FiO2 (%):  [28 %] 28 %   Intake/Output Summary (Last 24 hours) at 01/28/2022 0900 Last data filed at 01/28/2022 0800 Gross per 24 hour  Intake 2154.9 ml  Output 900 ml  Net 1254.9 ml    Filed Weights   01/26/22 0450 01/27/22 0500 01/28/22 0500  Weight: 42.4 kg 42.5 kg 42.9 kg   Physical Examination: No distress Abdomen TTP worse in LUQ, stable Lungs clear Ext with muscle wasting PICC CDI Moves ext to command  QT ok Sugars remain cyclically high Plts doubled?  Resolved Hospital Problem List:   Hypotension, suspect sedation related   Acute toxic encephalopathy in the setting of drug overdose Hypokalemia  Assessment & Plan:  Acute toxic metabolic encephalopathy, initially in setting of intentional overdose- Mental status back to normal Seizure disorder SI with intentional promethazine overdose Schizoaffective disorder -- Bipolar type MDD  Polysubstance use disorder- Phenobarb taper completed. Overall improved.  EKG looks okay 8/8 -Continue Haldol and Ativan twice daily -Psychiatry is following, appreciate help, eventual need for IVC  Acute respiratory failure with hypoxia initially in setting of overdose, then 2/2 aspiration s/p trach Completed antibiotic therapy - Lasix to PO - Has been on TC for 48h. Should be okay to transfer to step down.  Tobacco use disorder - Counseling provided regarding quit smoking COPD, not exacerbation Continue nebs as needed  Ongoing abdominal pain, colonic dysmotility- now having  BM; this is all related to anticholinergic effect of promethazine overdose.  Still has contrast in colon from 6 days ago. Severe protein  calorie malnutrition POA - Continue standing reglan, linzess, simethicone - Continue lactulose - Primary to check bladder scan - Would keep TPN for now given severe malnutrition and inconsistent PO  Iron deficiency anemia - Monitor H&H - Continue iron supplements  DM2 with hyperglycemia Continue to be labile with TPN, will do bid dosing vs add insulin to TPN, appreciate pharmD help  Best Practice (right click and "Reselect all SmartList Selections" daily)   Diet/type: TPN, dysphagia diet DVT prophylaxis: SCD and heparin. GI prophylaxis: PPI Lines: N/A Foley:  N/A Code Status:  full code Last date of multidisciplinary goals of care discussion [spoke with daughter 8/2]    Erskine Emery MD PCCM [facility] See Amion for pager If no response to pager, please call [phone] until 7pm After 7pm, Please call E-link [phone]

## 2022-01-29 ENCOUNTER — Inpatient Hospital Stay (HOSPITAL_COMMUNITY): Payer: Medicare Other

## 2022-01-29 DIAGNOSIS — K567 Ileus, unspecified: Secondary | ICD-10-CM | POA: Diagnosis not present

## 2022-01-29 DIAGNOSIS — T50902D Poisoning by unspecified drugs, medicaments and biological substances, intentional self-harm, subsequent encounter: Secondary | ICD-10-CM | POA: Diagnosis not present

## 2022-01-29 DIAGNOSIS — E43 Unspecified severe protein-calorie malnutrition: Secondary | ICD-10-CM | POA: Diagnosis not present

## 2022-01-29 LAB — GLUCOSE, CAPILLARY
Glucose-Capillary: 108 mg/dL — ABNORMAL HIGH (ref 70–99)
Glucose-Capillary: 157 mg/dL — ABNORMAL HIGH (ref 70–99)
Glucose-Capillary: 171 mg/dL — ABNORMAL HIGH (ref 70–99)
Glucose-Capillary: 187 mg/dL — ABNORMAL HIGH (ref 70–99)
Glucose-Capillary: 69 mg/dL — ABNORMAL LOW (ref 70–99)
Glucose-Capillary: 82 mg/dL (ref 70–99)
Glucose-Capillary: 82 mg/dL (ref 70–99)
Glucose-Capillary: 89 mg/dL (ref 70–99)

## 2022-01-29 LAB — BASIC METABOLIC PANEL
Anion gap: 6 (ref 5–15)
BUN: 15 mg/dL (ref 6–20)
CO2: 29 mmol/L (ref 22–32)
Calcium: 8.4 mg/dL — ABNORMAL LOW (ref 8.9–10.3)
Chloride: 101 mmol/L (ref 98–111)
Creatinine, Ser: 0.37 mg/dL — ABNORMAL LOW (ref 0.44–1.00)
GFR, Estimated: >60 mL/min (ref >60)
Glucose, Bld: 88 mg/dL (ref 70–99)
Potassium: 4.4 mmol/L (ref 3.5–5.1)
Sodium: 136 mmol/L (ref 135–145)

## 2022-01-29 LAB — MAGNESIUM: Magnesium: 2.1 mg/dL (ref 1.7–2.4)

## 2022-01-29 LAB — PHOSPHORUS: Phosphorus: 4.5 mg/dL (ref 2.5–4.6)

## 2022-01-29 LAB — RETICULOCYTES: RBC.: 3.55 MIL/uL — ABNORMAL LOW (ref 3.87–5.11)

## 2022-01-29 LAB — PROCALCITONIN: Procalcitonin: 0.24 ng/mL

## 2022-01-29 MED ORDER — HYDRALAZINE HCL 20 MG/ML IJ SOLN: 10.0000 mg | INTRAMUSCULAR | Status: DC | PRN

## 2022-01-29 MED ORDER — GUAIFENESIN 100 MG/5ML PO LIQD
5.0000 mL | ORAL | Status: DC | PRN
  Filled 2022-01-29: qty 10

## 2022-01-29 MED ORDER — METOPROLOL TARTRATE 5 MG/5ML IV SOLN
5.0000 mg | INTRAVENOUS | Status: DC | PRN
  Administered 2022-02-01: 5 mg via INTRAVENOUS
  Filled 2022-01-29: qty 5

## 2022-01-29 MED ORDER — FENTANYL CITRATE PF 50 MCG/ML IJ SOSY
25.0000 ug | PREFILLED_SYRINGE | Freq: Once | INTRAMUSCULAR | Status: AC
  Administered 2022-01-29: 25 ug via INTRAVENOUS
  Filled 2022-01-29: qty 1

## 2022-01-29 MED ORDER — SORBITOL 70 % SOLN
960.0000 mL | TOPICAL_OIL | Freq: Once | ORAL | Status: AC
  Administered 2022-01-29: 960 mL via RECTAL
  Filled 2022-01-29: qty 473

## 2022-01-29 MED ORDER — TRAVASOL 10 % IV SOLN
INTRAVENOUS | Status: AC
  Filled 2022-01-29: qty 856.8

## 2022-01-29 MED ORDER — SODIUM CHLORIDE 0.9 % IV SOLN
250.0000 mg | Freq: Every day | INTRAVENOUS | Status: AC
  Administered 2022-01-29 – 2022-02-01 (×4): 250 mg via INTRAVENOUS
  Filled 2022-01-29 (×4): qty 20

## 2022-01-29 MED ORDER — IPRATROPIUM-ALBUTEROL 0.5-2.5 (3) MG/3ML IN SOLN: 3.0000 mL | RESPIRATORY_TRACT | Status: DC | PRN

## 2022-01-29 NOTE — Progress Notes (Signed)
Physical Therapy Treatment Patient Details Name: [name] MRN: [id number] DOB: 03-02-1966 Today's Date: 01/29/2022   History of Present Illness 56 y.o. female admitted 01/12/22 after suicide attempt with intentional promethazine OD. EEG without seizures; showed evidence of epileptogenicity. ETT 7/23-7/25. Reintubated 7/26. Ileus. 7/30 extubated but then trached and returned to vent. PMHx:Afib, COPD, DM, neuropathy, HTN, bipolar disorder, schizophrenia, anxiety, depression, ETOH abuse.    PT Comments    Pt received seated EOB and eager for mobility, coming to standing before lines/leads ready. Pt on 5L trach collar at 28% FiO2 with SpO2 95% at rest on entry and tolerating ambulation on RA with SpO2 maintaining >90% throughout ambulation, grossly 91-94% (saturation qualifications note charted per RN request). Pt continues to need increased cues throughout mobility for environmental awareness and direction/navigation. Pt with fair tolerance for limited HEP with further deferred due to stomach discomfort and pt abruptly returning to supine. Pt continues to benefit from skilled PT services to progress toward functional mobility goals.    Recommendations for follow up therapy are one component of a multi-disciplinary discharge planning process, led by the attending physician.  Recommendations may be updated based on patient status, additional functional criteria and insurance authorization.  Follow Up Recommendations  Skilled nursing-short term rehab (<3 hours/day) Can patient physically be transported by private vehicle: Yes   Assistance Recommended at Discharge Frequent or constant Supervision/Assistance  Patient can return home with the following Assist for transportation;Help with stairs or ramp for entrance;Assistance with cooking/housework;Direct supervision/assist for medications management;Direct supervision/assist for financial management;A little help with walking and/or transfers;A  little help with bathing/dressing/bathroom   Equipment Recommendations  Rolling walker (2 wheels);BSC/3in1    Recommendations for Other Services       Precautions / Restrictions Precautions Precautions: Fall;Other (comment) Precaution Comments: trach, watch sats     Mobility  Bed Mobility Overal bed mobility: Needs Assistance Bed Mobility: Sit to Supine       Sit to supine: Min guard   General bed mobility comments: Min Guard A for safety    Transfers Overall transfer level: Needs assistance Equipment used: Rolling walker (2 wheels) Transfers: Sit to/from Stand Sit to Stand: Min guard, Supervision           General transfer comment: Min Guard A for safety    Ambulation/Gait Ambulation/Gait assistance: Herbalist (Feet): 360 Feet Assistive device: Rolling walker (2 wheels) Gait Pattern/deviations: Step-through pattern, Decreased stride length       General Gait Details: pt on RA throughout, SpO2 91-95% throughout, assist to manage RW and for direction assist as pt with difficulty navigating in hall and with poor environmental awareness   Stairs             Wheelchair Mobility    Modified Rankin (Stroke Patients Only)       Balance Overall balance assessment: Needs assistance Sitting-balance support: No upper extremity supported, Feet supported Sitting balance-Leahy Scale: Good     Standing balance support: During functional activity, Bilateral upper extremity supported Standing balance-Leahy Scale: Poor Standing balance comment: RW for gait                            Cognition Arousal/Alertness: Awake/alert Behavior During Therapy: Flat affect Overall Cognitive Status: Impaired/Different from baseline Area of Impairment: Safety/judgement, Attention, Following commands, Problem solving                   Current Attention Level: Sustained  Memory: Decreased short-term memory   Safety/Judgement: Decreased  awareness of safety, Decreased awareness of deficits   Problem Solving: Slow processing General Comments: Requiring increased time and cues thorughout.        Exercises General Exercises - Lower Extremity Long Arc Quad: AROM, Both, 10 reps, Seated (with manual graded resistance) Hip Flexion/Marching: AROM, Right, Left, 20 reps, Seated (with pt counting)    General Comments General comments (skin integrity, edema, etc.): 5L 28% O2 via trach collar 94% at rest, ambulation on RA with SpO2 91-95% throughout      Pertinent Vitals/Pain Pain Assessment Pain Assessment: Faces Faces Pain Scale: Hurts a little bit Pain Location: stomach Pain Descriptors / Indicators: Aching, Guarding Pain Intervention(s): Monitored during session, Limited activity within patient's tolerance    Home Living                          Prior Function            PT Goals (current goals can now be found in the care plan section) Acute Rehab PT Goals PT Goal Formulation: Patient unable to participate in goal setting Time For Goal Achievement: 01/31/22    Frequency    Min 3X/week      PT Plan Current plan remains appropriate    Co-evaluation              AM-PAC PT "6 Clicks" Mobility   Outcome Measure  Help needed turning from your back to your side while in a flat bed without using bedrails?: A Little Help needed moving from lying on your back to sitting on the side of a flat bed without using bedrails?: A Little Help needed moving to and from a bed to a chair (including a wheelchair)?: A Little Help needed standing up from a chair using your arms (e.g., wheelchair or bedside chair)?: A Little Help needed to walk in hospital room?: A Little Help needed climbing 3-5 steps with a railing? : A Lot 6 Click Score: 17    End of Session   Activity Tolerance: Patient tolerated treatment well Patient left: in bed;with call bell/phone within reach;with family/visitor present;with  nursing/sitter in room Nurse Communication: Mobility status PT Visit Diagnosis: Other abnormalities of gait and mobility (R26.89);Difficulty in walking, not elsewhere classified (R26.2);Muscle weakness (generalized) (M62.81)     Time: 7341-9379 PT Time Calculation (min) (ACUTE ONLY): 27 min  Charges:  $Gait Training: 8-22 mins $Therapeutic Activity: 8-22 mins                     Nyima Vanacker R. PTA Acute Rehabilitation Services Office: [place] 01/29/2022, 10:56 AM

## 2022-01-29 NOTE — Progress Notes (Signed)
SATURATION QUALIFICATIONS: (This note is used to comply with regulatory documentation for home oxygen)  Patient Saturations on Room Air at Rest = 94%  Patient Saturations on Room Air while Ambulating = 91%  Patient Saturations on 28% FiO2 5 Liters of oxygen at rest = 94%  Pt did not desaturate on room air with mobility and did not require supplemental oxygen to maintain a safe saturation level this session.   Audry Riles. PTA Acute Rehabilitation Services Office: [phone]

## 2022-01-29 NOTE — Progress Notes (Signed)
   01/29/22 1543  Assess: MEWS Score  Temp (!) 101.3 F (38.5 C)  BP 118/64  MAP (mmHg) 79  Pulse Rate (!) 102  ECG Heart Rate (!) 104  Resp (!) 26  SpO2 92 %  O2 Device Tracheostomy Collar  Patient Activity (if Appropriate) In bed  O2 Flow Rate (L/min) 5 L/min  Assess: MEWS Score  MEWS Temp 1  MEWS Systolic 0  MEWS Pulse 1  MEWS RR 2  MEWS LOC 0  MEWS Score 4  MEWS Score Color Red  Assess: if the MEWS score is Yellow or Red  Were vital signs taken at a resting state? Yes  Focused Assessment No change from prior assessment  Does the patient meet 2 or more of the SIRS criteria? Yes  MEWS guidelines implemented *See Row Information* Yes  Treat  Pain Scale 0-10  Pain Score 0  Take Vital Signs  Increase Vital Sign Frequency  Red: Q 1hr X 4 then Q 4hr X 4, if remains red, continue Q 4hrs  Notify: Charge Nurse/RN  Name of Charge Nurse/RN Notified Kristi J.  Date Charge Nurse/RN Notified 01/29/22  Time Charge Nurse/RN Notified 1607  Notify: Provider  Provider Name/Title Brigid Re MD  Date Provider Notified 01/29/22  Time Provider Notified 1608  Method of Notification Page  Notification Reason Other (Comment) (MEWs change to red)  Assess: SIRS CRITERIA  SIRS Temperature  1  SIRS Pulse 1  SIRS Respirations  1  SIRS WBC 1  SIRS Score Sum  4   Patient more lethargic today than normal. Patient temp 101, and increased secretions noted. HR has been a bit more elevated than her norm as well and RR is in mid 20-40s. Patient does appear ill whereas this morning she did not; however, on exam with the exception of the above, no changes are noted to any other systems.  Tylenol given, RT at bedside to do suctioning and trach change, charge nurse alerted. Gerlean Ren MD paged, no new orders. Continue to monitor.

## 2022-01-29 NOTE — Progress Notes (Signed)
Pt spo2 remained at 88%. Trach collar s/u changed as well and fio2 increased to 8L/35% with improvement to spo2 to 92%. MD made aware of significant increase in secretions from earlier in day. Pt cap trial to be on hold at this time per MD due to fever, increased secretions, and need for increased O2 needs.

## 2022-01-29 NOTE — Procedures (Signed)
Tracheostomy Change Note  Patient Details:   Name: [name] DOB: [birth year] MRN: [id number]    Airway Documentation:     Evaluation  O2 sats: transiently fell during during procedure and currently acceptable Complications: No apparent complications Patient did tolerate procedure well. Bilateral Breath Sounds: Rhonchi   RT changed pt's trach per MD order from #6 Shiley Cuffed to #4 cuffless with no complications. 2nd RT at bedside and assisting. Positive color change noted on etco2, secured with proper cuff ties, pt suctioned with no complications. Pt denies SOB. Pt spo2 remained at 88%. Trach collar s/u changed as well and fio2 increased to 8L/35% with improvement to spo2 to 92%. MD made aware of significant increase in secretions from earlier in day.   Jesse Sans 01/29/2022, 5:34 PM

## 2022-01-29 NOTE — Progress Notes (Signed)
NAME:  [name], MRN:  [id number], DOB:  April 02, 1966, LOS: 56 ADMISSION DATE:  01/12/2022, CONSULTATION DATE:  01/12/21 REFERRING MD:  Sedonia Small - EM, CHIEF COMPLAINT:  Intentional Overdose    History of Present Illness:  56 yo F PMH schizoaffective disorder (bipolar type), Depression, prior SI, polysubstance use disorder, COPD, HTN, DM presented to the ED following suicide attempt. Patient had been drinking, while with friends stated that she was going to kill herself and was witnessed ingesting a bottle of promethazine.   In transport with EMS became more somnolent. Arrived to ED with GCS 3 and was intubated in this setting. EM discussed case with poison control who recommended supportive care  PCCM consulted for admission in this setting.  Pertinent Medical History:  Schizoaffective disorder, bipolar type Depression Polysubstance abuse  COPD HTN DM    Significant Hospital Events: Including procedures, antibiotic start and stop dates in addition to other pertinent events   7/23 ED after etoh consumption and intentional promethazine overdose. Intubated. Admit to ICU  7/24 Tachycardic to 130s overnight, also with episode of whole body stiffening, ?dystonia vs. seizure. EEG with irritability, LTM initiated. Neuro consulted. 7/25 Extubated, intermittently somnolent/agitated, not managing secretions well. O2 sats stable on [place]. Cortrak difficult to flush. 7/26 Reintubated early AM for hypoxia, airway protection. Unasyn initiated for ?aspiration PNA. LTM EEG discontinued. MRI pending. 7/27 MRI no acute changes. Wakes up follows commands on .7 dex  7/28 off NE  01/19/2022>> Trach after goals of care discussion with daughter 7/31 Transitioned to ATC.  8/2 CT abdomen: Stool in right colon.  Normal appendix.  Placed gastric bypass surgery.  Nasogastric tube in stomach.  No evidence of wall thickening or obstruction and bowel.  Questionable cystitis.  Bibasilar pleural effusions.  Ceftriaxone  started.  Interim History / Subjective:  No acute issues  Objective:  Blood pressure 132/72, pulse (!) 108, temperature 98.8 F (37.1 C), temperature source Oral, resp. rate (!) 24, height 5' (1.524 m), weight 43 kg, last menstrual period 01/08/2011, SpO2 96 %.    FiO2 (%):  [25 %-28 %] 28 %   Intake/Output Summary (Last 24 hours) at 01/29/2022 1026 Last data filed at 01/29/2022 0510 Gross per 24 hour  Intake 2085.42 ml  Output 2406 ml  Net -320.58 ml   Filed Weights   01/27/22 0500 01/28/22 0500 01/29/22 0500  Weight: 42.5 kg 42.9 kg 43 kg   Physical Examination: General: Frail 56 year old no distress HEENT: MM pink/moist tracheostomy is unremarkable Neuro: Dull afebrile follows commands CV: Heart sounds are regular PULM: Minich breath sounds throughout  GI: Tender to touch TPN is in place amber GU: Museum/gallery conservator urine Extremities: warm/dry, 1+ edema  Skin: no rashes or lesions    Resolved Hospital Problem List:   Hypotension, suspect sedation related   Acute toxic encephalopathy in the setting of drug overdose Hypokalemia  Assessment & Plan:  Acute toxic metabolic encephalopathy, initially in setting of intentional overdose- Mental status back to normal Seizure disorder SI with intentional promethazine overdose Schizoaffective disorder -- Bipolar type MDD  Polysubstance use disorder- Phenobarb taper completed. Overall improved.  EKG looks okay 8/8 -Continue Haldol and Ativan twice daily -Psychiatry is following, appreciate help, eventual need for IVC  Acute respiratory failure with hypoxia initially in setting of overdose, then 2/2 aspiration s/p trach Completed antibiotic therapy - Lasix to PO - Has been on TC for 48h. Should be okay to transfer to step down.  Tobacco use disorder - Counseling provided regarding  quit smoking COPD, not exacerbation Continue nebs as needed  Ongoing abdominal pain, colonic dysmotility- now having BM; this is all related to  anticholinergic effect of promethazine overdose.  Still has contrast in colon from 6 days ago. Severe protein calorie malnutrition POA - Continue standing reglan, linzess, simethicone - Continue lactulose - Primary to check bladder scan - Would keep TPN for now given severe malnutrition and inconsistent PO  Iron deficiency anemia - Monitor H&H - Continue iron supplements  DM2 with hyperglycemia Continue to be labile with TPN, will do bid dosing vs add insulin to TPN, appreciate pharmD help  Best Practice (right click and "Reselect all SmartList Selections" daily)   Diet/type: TPN, dysphagia diet DVT prophylaxis: SCD and heparin. GI prophylaxis: PPI Lines: N/A Foley:  N/A Code Status:  full code Last date of multidisciplinary goals of care discussion [spoke with daughter 8/2]    Richardson Landry Danna Sewell ACNP Acute Care Nurse Practitioner [place] Please consult Amion 01/29/2022, 10:26 AM

## 2022-01-29 NOTE — Progress Notes (Signed)
PROGRESS NOTE    [name]  HTD:[id number] DOB: 1965/07/07 DOA: 01/12/2022 PCP: [name], MD   Brief Narrative:  56 year old with schizoaffective disorder, depression, polysubstance abuse, COPD, HTN, DM2 for suicide attempt and overdosing on promethazine and alcohol.  Initially patient was intubated due to GCS of 3 complicated by aspiration pneumonia.  Eventually when trach placement on 7/30 and transition to trach collar 7/31.  Other complicated issues that she developed ileus, urinary retention with intermittent fluid overload.  Current events and procedures 7/23 ED after etoh consumption and intentional promethazine overdose. Intubated. Admit to ICU  7/24 Tachycardic to 130s overnight, also with episode of whole body stiffening, ?dystonia vs. seizure. EEG with irritability, LTM initiated. Neuro consulted. 7/25 Extubated, intermittently somnolent/agitated, not managing secretions well. O2 sats stable on [place]. Cortrak difficult to flush. 7/26 Reintubated early AM for hypoxia, airway protection. Unasyn initiated for ?aspiration PNA. LTM EEG discontinued. MRI pending. 7/27 MRI no acute changes. Wakes up follows commands on .7 dex  7/28 off NE  01/19/2022>> Trach after goals of care discussion with daughter 7/31 Transitioned to ATC.  8/2 CT abdomen: Stool in right colon.  Normal appendix.  Placed gastric bypass surgery.  Nasogastric tube in stomach.  No evidence of wall thickening or obstruction and bowel.  Questionable cystitis.  Bibasilar pleural effusions.  Ceftriaxone started. 01/28/2022.  Transferred to hospitalist service on day 16 of her hospital stay  Assessment & Plan:  Principal Problem:   Intentional overdose ([place]) Active Problems:   Hypokalemia   Protein-calorie malnutrition, severe   Ileus ([place])    Alcohol and promethazine overdose with likely attempted suicide with history of schizoaffective disorder bipolar type, history of polysubstance abuse.   Patient has been  seen by psychiatry-currently on Haldol and Ativan.  Plans to add antidepressant later this week.  Patient is IVC Needs inpatient psych  Obstipation with colonic dysmotility/ileus Acute urinary retention -Supportive care.  Periodic bladder scan.  On aggressive bowel regimen.  Reglan every 6 hours. SMOG enema. Concerns of constipation.    ?? History of seizure disorder.   Initially there was question of seizures but the workup was overall negative therefore taken off Keppra by neurology    Microcytic anemia. -Low iron saturation, slightly low ferritin 56.  Will give IV iron X4 doses.  For now we will avoid p.o. due to issues with constipation.   Acute hypoxic respiratory failure secondary to aspiration Status post tracheostomy Tracheostomy placed due to recurrent intubation.  Has 6 mm cuffed [place].  Routine trach care.  Due to diet, getting TPN.  Pulmonary following. ProCal 0.24   Moderate protein calorie malnutrition.  -Dysphagia 2 diet and TPN.  RU E PICC line in place 7/28   History of tobacco abuse and COPD.   -As needed bronchodilators No acute issues counseled to quit tobacco and alcohol.   GERD.   -PPI nightly.  Simethicone.   DM type II -On sliding scale and Accu-Cheks     DVT prophylaxis: Subcu heparin Code Status: Full  Family Communication:  Mother at bedside   Status is: Inpatient Remains inpatient appropriate because: Doing ok. Awaiting placement.    Nutritional status    Signs/Symptoms: severe fat depletion, severe muscle depletion  Interventions: Refer to RD note for recommendations, Ensure Enlive (each supplement provides 350kcal and 20 grams of protein), MVI  Body mass index is 18.51 kg/m.         Subjective: Some left lower quadrant abdominal pain but no other complaints.  Examination:  General exam: Appears calm and comfortable  Respiratory system: Clear to auscultation. Respiratory effort normal. Cardiovascular system: S1 & S2  heard, RRR. No JVD, murmurs, rubs, gallops or clicks. No pedal edema. Gastrointestinal system: Abdomen is nondistended, soft and nontender. No organomegaly or masses felt. Normal bowel sounds heard. Central nervous system: Alert and oriented. No focal neurological deficits. Extremities: Symmetric 5 x 5 power. Skin: No rashes, lesions or ulcers Psychiatry: Judgement and insight appear normal. Mood & affect appropriate.  Tracheostomy noted PICC line noted   Objective: Vitals:   01/28/22 2305 01/29/22 0019 01/29/22 0314 01/29/22 0500  BP: 115/65 115/65 (!) 112/51   Pulse: 88 97 94   Resp: (!) 30 (!) 29 (!) 24   Temp: 98.8 F (37.1 C)  98.2 F (36.8 C)   TempSrc: Axillary  Oral   SpO2: 94% 92% 95%   Weight:    43 kg  Height:        Intake/Output Summary (Last 24 hours) at 01/29/2022 0732 Last data filed at 01/29/2022 0510 Gross per 24 hour  Intake 2892.04 ml  Output 2631 ml  Net 261.04 ml   Filed Weights   01/27/22 0500 01/28/22 0500 01/29/22 0500  Weight: 42.5 kg 42.9 kg 43 kg     Data Reviewed:   CBC: Recent Labs  Lab 01/24/22 1105 01/28/22 0635  WBC 14.5* 14.2*  NEUTROABS 12.5*  --   HGB 7.5* 7.3*  HCT 26.1* 25.1*  MCV 73.3* 70.3*  PLT 296 812*   Basic Metabolic Panel: Recent Labs  Lab 01/23/22 0404 01/23/22 0842 01/24/22 0503 01/25/22 0413 01/26/22 0416 01/27/22 0430 01/28/22 0635 01/29/22 0430  NA 138   < > 136 133* 131* 132* 133* 136  K 5.2*   < > 3.7 4.1 4.2 4.1 4.2 4.4  CL 106   < > 100 98 93* 93* 98 101  CO2 27   < > '29 30 28 30 28 29  '$ GLUCOSE 394*   < > 211* 179* 251* 268* 186* 88  BUN 10   < > '10 11 10 10 17 15  '$ CREATININE 0.30*   < > <0.30* 0.44 0.32* 0.38* 0.32* 0.37*  CALCIUM 8.1*   < > 8.4* 8.0* 7.8* 8.4* 8.3* 8.4*  MG 2.1  --  1.9 2.0 2.0 1.8 2.0 2.1  PHOS 4.4  --  3.5  --   --  4.5  --  4.5   < > = values in this interval not displayed.   GFR: Estimated Creatinine Clearance: 52.7 mL/min (A) (by C-G formula based on SCr of 0.37 mg/dL  (L)). Liver Function Tests: Recent Labs  Lab 01/23/22 0404 01/23/22 0842 01/27/22 0430  AST 58* 51* 56*  ALT 59* 63* 87*  ALKPHOS 59 58 89  BILITOT 0.3 0.4 0.2*  PROT 6.3* 6.8 7.9  ALBUMIN 1.6* 1.6* 1.8*   No results for input(s): "LIPASE", "AMYLASE" in the last 168 hours. No results for input(s): "AMMONIA" in the last 168 hours. Coagulation Profile: No results for input(s): "INR", "PROTIME" in the last 168 hours. Cardiac Enzymes: No results for input(s): "CKTOTAL", "CKMB", "CKMBINDEX", "TROPONINI" in the last 168 hours. BNP (last 3 results) No results for input(s): "PROBNP" in the last 8760 hours. HbA1C: No results for input(s): "HGBA1C" in the last 72 hours. CBG: Recent Labs  Lab 01/28/22 1603 01/28/22 2056 01/29/22 0019 01/29/22 0047 01/29/22 0424  GLUCAP 191* 229* 69* 82 89   Lipid Profile: Recent Labs    01/27/22  0430  TRIG 39   Thyroid Function Tests: Recent Labs    01/28/22 1030  TSH 3.043   Anemia Panel: Recent Labs    01/28/22 1017 01/28/22 1018  VITAMINB12  --  1,070*  FOLATE 9.9  --   FERRITIN  --  56  TIBC  --  287  IRON  --  13*   Sepsis Labs: Recent Labs  Lab 01/24/22 1449 01/28/22 0635 01/29/22 0430  PROCALCITON  --  <0.10 0.24  LATICACIDVEN 2.8*  --   --     No results found for this or any previous visit (from the past 240 hour(s)).       Radiology Studies: DG Chest Port 1 View  Result Date: 01/28/2022 CLINICAL DATA:  Shortness of breath, check tracheostomy cannula. EXAM: PORTABLE CHEST 1 VIEW COMPARISON:  Portable chest 01/21/2022 FINDINGS: Right PICC again terminates at about the superior cavoatrial junction; tracheostomy cannula tip is 4.6 cm from the carina. Interval removal of feeding tube. There is stable cardiomegaly and mild central vascular and interstitial prominence consistent with mild interstitial edema. There is streaky atelectasis or infiltrates in both infrahilar areas. Please correlate clinically. The  remaining lungs are generally clear. Today the pleural effusions do appear smaller than previously but in all other respects there are no further changes. IMPRESSION: 1. Persistent mild central vascular and interstitial prominence. 2. Small pleural effusions appear smaller than previously, with persistent bilateral infrahilar streaky atelectasis or pneumonia. 3. Support devices as above. Electronically Signed   By: Telford Nab M.D.   On: 01/28/2022 07:12   DG Abd Portable 1V  Result Date: 01/27/2022 CLINICAL DATA:  Ileus. EXAM: PORTABLE ABDOMEN - 1 VIEW COMPARISON:  January 20, 2022. FINDINGS: Feeding tube has been removed. Residual contrast is noted in the colon. No abnormal bowel dilatation is noted. IMPRESSION: No abnormal bowel dilatation. Electronically Signed   By: Marijo Conception M.D.   On: 01/27/2022 09:19        Scheduled Meds:  vitamin C  500 mg Oral Daily   bisacodyl  10 mg Rectal BID   Chlorhexidine Gluconate Cloth  6 each Topical Daily   cyanocobalamin  1,000 mcg Intramuscular Q7 days   docusate sodium  100 mg Oral BID   feeding supplement  237 mL Oral BID BM   furosemide  40 mg Oral Daily   guaiFENesin  600 mg Oral BID   haloperidol lactate  2 mg Intravenous BID   And   LORazepam  1 mg Intravenous BID   heparin injection (subcutaneous)  5,000 Units Subcutaneous Q8H   insulin aspart  0-15 Units Subcutaneous Q4H   lactulose  30 g Oral TID   linaclotide  145 mcg Oral QAC breakfast   metoCLOPramide (REGLAN) injection  5 mg Intravenous Q6H   metoprolol tartrate  25 mg Oral BID   mouth rinse  15 mL Mouth Rinse 4 times per day   pantoprazole  40 mg Oral QHS   revefenacin  175 mcg Nebulization Daily   simethicone  40 mg Oral QID   sodium chloride flush  10-40 mL Intracatheter Q12H   vitamin A  50,000 Units Intramuscular Daily   Followed by   Derrill Memo ON 02/11/2022] vitamin A  10,000 Units Oral Daily   Vitamin D (Ergocalciferol)  50,000 Units Oral Q7 days   Continuous  Infusions:  sodium chloride Stopped (01/28/22 1141)   TPN ADULT (ION) 70 mL/hr at 01/29/22 0314     LOS: 17 days  Time spent= 35 mins    Serge Main Arsenio Loader, MD [facility]  If 7PM-7AM, please contact night-coverage  01/29/2022, 7:32 AM

## 2022-01-29 NOTE — Progress Notes (Signed)
IVC 

## 2022-01-29 NOTE — Care Management Important Message (Signed)
Important Message  Patient Details  Name: [name] MRN: [id number] Date of Birth: 07-May-1966   Medicare Important Message Given:  Yes     Judd Mccubbin 01/29/2022, 12:30 PM

## 2022-01-29 NOTE — Progress Notes (Signed)
PHARMACY - TOTAL PARENTERAL NUTRITION CONSULT NOTE  Indication: Prolonged ileus  Patient Measurements: Height: 5' (152.4 cm) Weight: 43 kg (94 lb 12.8 oz) IBW/kg (Calculated) : 45.5 TPN AdjBW (KG): 42.2 Body mass index is 18.51 kg/m. Usual weight:  240 lbs in 2017, now 88 lbs with significant weight loss over the past 3 yrs  Assessment:  23 YOF with history of polysubstance abuse and bariatric surgery presented on 7/23 s/p intentional overdose.  CT and abd XR showed large amount of stool in the colon and an ileus. She has severe PCM with minimal nutrition since admit (TF from 7/23 until 7/25), so Pharmacy consulted to manage TPN.  Pt removed CorTrak on 8/4 and TFs were discontinued at 1639. Pt passed swallow eval for Dysphagia 2 diet. Pt has eaten ~25-50% of her meals. Dietician is performing a calorie count.  Glucose / Insulin: hx DM on ?metformin PTA - hypoglycemic x2 on 8/8  CBGs increased 69 - 108. Used 5 units on sensitive SSI in the past 24 hrs. Glargine increased to 22 units daily. BG trend improving. - Will plan to decrease to 15 units in TPN today Electrolytes: Na 136, Cl 93, CoCa 9.7, others WNL Renal: SCr 0.37 -stable, BUN 15-stable Hepatic: LFTs mildly elevated/rising, tbili/TG WNL, albumin 1.8 Intake / Output; MIVF: UOP 2.5 ml/kg/hr, BM x 3 on 8/8, no overt volume overload noted on exam  Vitamin/Mineral panel: low Vit D/A/C.  Vitamins B1/B12, copper, zinc and folate WNL. GI Imaging:  8/2 CT Abd/P: pleural effusions, B/L renal collecting system dilatation with concern of cystitis GI Surgeries / Procedures: none since TPN initiation   Central access: PICC placed 01/17/22 TPN start date: 01/18/22  Nutritional Goals: RD Estimated Needs Total Energy Estimated Needs: 1600-1800 kcals Total Protein Estimated Needs: 100-115 g Total Fluid Estimated Needs: >/= 1.6 L  Current Nutrition:  Soft diet and TPN  Plan:  Continue TPN to goal rate of 70 ml/hr to provide 86g AA, 244g CHO  and 44g ILE for a total of 1607 kCal, meeting 100% of needs Electrolytes in TPN: Na 50 mEq/L, K 55 mEq/L, Ca 5 mEq/L, Mg 7 mEq/L, Dec Phos 7 mmol/L, WU:[id number] ratio 2:1 -  Decreases to 15 units regular insulin to TPN Add standard MVI and trace elements to TPN  Change moderate SSI frequency to TID AC D/c insulin glargine  Standard TPN labs on Mon and Thurs Started Vit A, D and C supplementation on 8/5    > Vitamin A 100,000 units IM daily x 3 days, followed by 50,000 units IM daily x 14 days, followed by 10,000 units PO daily x 60 days   > Vitamin D2 (ergocalciferol) 50,000 units PO once weekly x 8 weeks   > Vitamin C (ascorbic acid) '500mg'$  PO daily x28 days F/u on PO diet intake, calorie count per dietician, and plan for weaning TPN.  Alanda Slim, PharmD, [facility] Clinical Pharmacist Please see AMION for all Pharmacists' Contact Phone Numbers 01/29/2022, 9:10 AM

## 2022-01-29 NOTE — Progress Notes (Signed)
Hypoglycemic Event  CBG: 69  Treatment: 8 oz juice/soda  Symptoms: None  Follow-up CBG: [id number] CBG Result:82  Possible Reasons for Event: Inadequate meal intake  Comments/MD notified:Patient asymptomatic    Warren Danes

## 2022-01-30 ENCOUNTER — Inpatient Hospital Stay (HOSPITAL_COMMUNITY): Payer: Medicare Other

## 2022-01-30 DIAGNOSIS — T50902D Poisoning by unspecified drugs, medicaments and biological substances, intentional self-harm, subsequent encounter: Secondary | ICD-10-CM | POA: Diagnosis not present

## 2022-01-30 DIAGNOSIS — E43 Unspecified severe protein-calorie malnutrition: Secondary | ICD-10-CM | POA: Diagnosis not present

## 2022-01-30 DIAGNOSIS — E876 Hypokalemia: Secondary | ICD-10-CM | POA: Diagnosis not present

## 2022-01-30 DIAGNOSIS — K567 Ileus, unspecified: Secondary | ICD-10-CM | POA: Diagnosis not present

## 2022-01-30 LAB — GLUCOSE, CAPILLARY
Glucose-Capillary: 64 mg/dL — ABNORMAL LOW (ref 70–99)
Glucose-Capillary: 81 mg/dL (ref 70–99)
Glucose-Capillary: 171 mg/dL — ABNORMAL HIGH (ref 70–99)
Glucose-Capillary: 126 mg/dL — ABNORMAL HIGH (ref 70–99)
Glucose-Capillary: 234 mg/dL — ABNORMAL HIGH (ref 70–99)
Glucose-Capillary: 153 mg/dL — ABNORMAL HIGH (ref 70–99)
Glucose-Capillary: 233 mg/dL — ABNORMAL HIGH (ref 70–99)

## 2022-01-30 LAB — PROCALCITONIN: Procalcitonin: 0.63 ng/mL

## 2022-01-30 LAB — URINALYSIS, ROUTINE W REFLEX MICROSCOPIC
Bilirubin Urine: NEGATIVE
Glucose, UA: NEGATIVE mg/dL
Hgb urine dipstick: NEGATIVE
Ketones, ur: NEGATIVE mg/dL
Nitrite: NEGATIVE
Protein, ur: NEGATIVE mg/dL
Specific Gravity, Urine: 1.012 (ref 1.005–1.030)
pH: 5 (ref 5.0–8.0)

## 2022-01-30 LAB — COMPREHENSIVE METABOLIC PANEL
ALT: 82 U/L — ABNORMAL HIGH (ref 0–44)
AST: 56 U/L — ABNORMAL HIGH (ref 15–41)
Albumin: 1.6 g/dL — ABNORMAL LOW (ref 3.5–5.0)
Alkaline Phosphatase: 137 U/L — ABNORMAL HIGH (ref 38–126)
Anion gap: 6 (ref 5–15)
BUN: 19 mg/dL (ref 6–20)
CO2: 26 mmol/L (ref 22–32)
Calcium: 8.3 mg/dL — ABNORMAL LOW (ref 8.9–10.3)
Chloride: 102 mmol/L (ref 98–111)
Creatinine, Ser: 0.48 mg/dL (ref 0.44–1.00)
GFR, Estimated: >60 mL/min (ref >60)
Glucose, Bld: 106 mg/dL — ABNORMAL HIGH (ref 70–99)
Potassium: 4.9 mmol/L (ref 3.5–5.1)
Sodium: 134 mmol/L — ABNORMAL LOW (ref 135–145)
Total Bilirubin: 0.2 mg/dL — ABNORMAL LOW (ref 0.3–1.2)
Total Protein: 7.4 g/dL (ref 6.5–8.1)

## 2022-01-30 LAB — LIPASE, BLOOD: Lipase: 141 U/L — ABNORMAL HIGH (ref 11–51)

## 2022-01-30 LAB — MAGNESIUM: Magnesium: 2.1 mg/dL (ref 1.7–2.4)

## 2022-01-30 LAB — PHOSPHORUS: Phosphorus: 5 mg/dL — ABNORMAL HIGH (ref 2.5–4.6)

## 2022-01-30 MED ORDER — ENSURE ENLIVE PO LIQD
237.0000 mL | Freq: Three times a day (TID) | ORAL | Status: DC
  Administered 2022-01-31 – 2022-02-12 (×28): 237 mL via ORAL

## 2022-01-30 MED ORDER — TRAVASOL 10 % IV SOLN
INTRAVENOUS | Status: AC
  Filled 2022-01-30: qty 856.8

## 2022-01-30 MED ORDER — LORAZEPAM 2 MG/ML IJ SOLN
0.5000 mg | Freq: Once | INTRAMUSCULAR | Status: AC
  Administered 2022-01-30: 0.5 mg via INTRAVENOUS
  Filled 2022-01-30: qty 1

## 2022-01-30 MED ORDER — FENTANYL CITRATE PF 50 MCG/ML IJ SOSY
25.0000 ug | PREFILLED_SYRINGE | Freq: Once | INTRAMUSCULAR | Status: AC
  Administered 2022-01-30: 25 ug via INTRAVENOUS
  Filled 2022-01-30: qty 1

## 2022-01-30 MED ORDER — INSULIN ASPART 100 UNIT/ML IJ SOLN
0.0000 [IU] | INTRAMUSCULAR | Status: DC
  Administered 2022-01-30: 1 [IU] via SUBCUTANEOUS
  Administered 2022-01-30: 2 [IU] via SUBCUTANEOUS
  Administered 2022-01-30 – 2022-01-31 (×3): 1 [IU] via SUBCUTANEOUS

## 2022-01-30 NOTE — Consult Note (Signed)
Reason for Consult:Colonic ileus Referring Physician: [facility]  [name] HPI: This is a 56 year old female with multiple medical problems admitted for a suicide attempt.  On the day of admission she ingested a large amount of promethazine syrup.  When her mental status started to decline she was intubated.  She was complaining about abdominal pain on 01/29/2022 and a KUB showed moderate distension of the colon.  A CT scan performed today did show that the cecum was at 8 cm.  A colonic ileus was noted on 01/15/2022 and the thought was that promethazine was the culprit.  With her prolonged intubation she underwent a tracheostomy.   Past Medical History:  Diagnosis Date   Abdominal pain    Accidental drug overdose April 2013   Anxiety    Atrial fibrillation ([place]) 09/29/11   converted spontaneously   Chronic back pain    Chronic knee pain    Chronic nausea    Chronic pain    COPD (chronic obstructive pulmonary disease) ([place])    Depression    Diabetes mellitus    states her doctor took her off all DM meds in past month   Diabetic neuropathy ([place])    Dyspnea    with exertion    GERD (gastroesophageal reflux disease)    Headache(784.0)    migraines    HTN (hypertension)    not on meds since in a year    Hyperlipidemia    Hypothyroidism    not on meds in a while    Mental disorder    Bipolar and schizophrenic   Requires supplemental oxygen    as needed per patient    Schizophrenia ([place])    Schizophrenia, acute ([place]) 11/13/2017   Tobacco abuse     Past Surgical History:  Procedure Laterality Date   ABDOMINAL HYSTERECTOMY     BLADDER SUSPENSION  03/04/2011   Procedure: [facility] PROCEDURE;  Surgeon: Elayne Snare MacDiarmid;  Location: [facility];  Service: Urology;  Laterality: N/A;   CYSTOCELE REPAIR  03/04/2011   Procedure: ANTERIOR REPAIR (CYSTOCELE);  Surgeon: Reece Packer;  Location: [facility];  Service: Urology;  Laterality: N/A;   CYSTOSCOPY  03/04/2011   Procedure:  CYSTOSCOPY;  Surgeon: Elayne Snare MacDiarmid;  Location: [facility];  Service: Urology;  Laterality: N/A;   ESOPHAGOGASTRODUODENOSCOPY (EGD) WITH PROPOFOL N/A 05/12/2017   Procedure: ESOPHAGOGASTRODUODENOSCOPY (EGD) WITH PROPOFOL;  Surgeon: Alphonsa Overall, MD;  Location: [facility];  Service: General;  Laterality: N/A;   GASTRIC ROUX-EN-Y N/A 03/25/2016   Procedure: LAPAROSCOPIC ROUX-EN-Y GASTRIC BYPASS WITH UPPER ENDOSCOPY;  Surgeon: Excell Seltzer, MD;  Location: [facility];  Service: General;  Laterality: N/A;   KNEE SURGERY     LAPAROSCOPIC ASSISTED VAGINAL HYSTERECTOMY  03/04/2011   Procedure: LAPAROSCOPIC ASSISTED VAGINAL HYSTERECTOMY;  Surgeon: Cyril Mourning, MD;  Location: [facility];  Service: Gynecology;  Laterality: N/A;    Family History  Problem Relation Age of Onset   Heart attack Father        13s   Diabetes Mother    Heart disease Mother    Hypertension Mother    Heart attack Sister        53   COPD Other    Breast cancer Neg Hx     Social History:  reports that she has been smoking cigarettes. She has been smoking an average of .5 packs per day. She has never used smokeless tobacco. She reports current alcohol use. She reports current drug use. Drugs:  Cocaine, Marijuana, and "Crack" cocaine.  Allergies:  Allergies  Allergen Reactions   Iron Dextran Shortness Of Breath and Anxiety   Aspirin Nausea And Vomiting and Other (See Comments)    Ok to take tylenol or ibuprofen     Medications: Scheduled:  vitamin C  500 mg Oral Daily   bisacodyl  10 mg Rectal BID   Chlorhexidine Gluconate Cloth  6 each Topical Daily   cyanocobalamin  1,000 mcg Intramuscular Q7 days   docusate sodium  100 mg Oral BID   feeding supplement  237 mL Oral TID WC & HS   furosemide  40 mg Oral Daily   guaiFENesin  600 mg Oral BID   haloperidol lactate  2 mg Intravenous BID   And   LORazepam  1 mg Intravenous BID   heparin injection (subcutaneous)  5,000 Units Subcutaneous Q8H   insulin aspart   0-6 Units Subcutaneous Q4H   lactulose  30 g Oral TID   linaclotide  145 mcg Oral QAC breakfast   metoCLOPramide (REGLAN) injection  5 mg Intravenous Q6H   metoprolol tartrate  25 mg Oral BID   mouth rinse  15 mL Mouth Rinse 4 times per day   pantoprazole  40 mg Oral QHS   revefenacin  175 mcg Nebulization Daily   simethicone  40 mg Oral QID   sodium chloride flush  10-40 mL Intracatheter Q12H   vitamin A  50,000 Units Intramuscular Daily   Followed by   Derrill Memo ON 02/11/2022] vitamin A  10,000 Units Oral Daily   Vitamin D (Ergocalciferol)  50,000 Units Oral Q7 days   Continuous:  sodium chloride Stopped (01/28/22 1141)   ferric gluconate (FERRLECIT) IVPB 250 mg (01/30/22 0958)   TPN ADULT (ION) Stopped (01/30/22 1700)   TPN ADULT (ION) 70 mL/hr at 01/30/22 1704    Results for orders placed or performed during the hospital encounter of 01/12/22 (from the past 24 hour(s))  Glucose, capillary     Status: Abnormal   Collection Time: 01/29/22 10:08 PM  Result Value Ref Range   Glucose-Capillary 187 (H) 70 - 99 mg/dL  Glucose, capillary     Status: Abnormal   Collection Time: 01/29/22 11:55 PM  Result Value Ref Range   Glucose-Capillary 171 (H) 70 - 99 mg/dL  Glucose, capillary     Status: Abnormal   Collection Time: 01/30/22  3:12 AM  Result Value Ref Range   Glucose-Capillary 64 (L) 70 - 99 mg/dL   Comment 1 Notify RN    Comment 2 Document in Chart   Glucose, capillary     Status: None   Collection Time: 01/30/22  3:52 AM  Result Value Ref Range   Glucose-Capillary 81 70 - 99 mg/dL  Comprehensive metabolic panel     Status: Abnormal   Collection Time: 01/30/22  6:24 AM  Result Value Ref Range   Sodium 134 (L) 135 - 145 mmol/L   Potassium 4.9 3.5 - 5.1 mmol/L   Chloride 102 98 - 111 mmol/L   CO2 26 22 - 32 mmol/L   Glucose, Bld 106 (H) 70 - 99 mg/dL   BUN 19 6 - 20 mg/dL   Creatinine, Ser 0.48 0.44 - 1.00 mg/dL   Calcium 8.3 (L) 8.9 - 10.3 mg/dL   Total Protein 7.4 6.5 -  8.1 g/dL   Albumin 1.6 (L) 3.5 - 5.0 g/dL   AST 56 (H) 15 - 41 U/L   ALT 82 (H) 0 - 44 U/L  Alkaline Phosphatase 137 (H) 38 - 126 U/L   Total Bilirubin 0.2 (L) 0.3 - 1.2 mg/dL   GFR, Estimated >60 >60 mL/min   Anion gap 6 5 - 15  Magnesium     Status: None   Collection Time: 01/30/22  6:24 AM  Result Value Ref Range   Magnesium 2.1 1.7 - 2.4 mg/dL  Procalcitonin     Status: None   Collection Time: 01/30/22  6:24 AM  Result Value Ref Range   Procalcitonin 0.63 ng/mL  Phosphorus     Status: Abnormal   Collection Time: 01/30/22  6:24 AM  Result Value Ref Range   Phosphorus 5.0 (H) 2.5 - 4.6 mg/dL  Lipase, blood     Status: Abnormal   Collection Time: 01/30/22  7:08 AM  Result Value Ref Range   Lipase 141 (H) 11 - 51 U/L  Glucose, capillary     Status: Abnormal   Collection Time: 01/30/22  8:33 AM  Result Value Ref Range   Glucose-Capillary 171 (H) 70 - 99 mg/dL  Urinalysis, Routine w reflex microscopic     Status: Abnormal   Collection Time: 01/30/22  8:40 AM  Result Value Ref Range   Color, Urine YELLOW YELLOW   APPearance CLEAR CLEAR   Specific Gravity, Urine 1.012 1.005 - 1.030   pH 5.0 5.0 - 8.0   Glucose, UA NEGATIVE NEGATIVE mg/dL   Hgb urine dipstick NEGATIVE NEGATIVE   Bilirubin Urine NEGATIVE NEGATIVE   Ketones, ur NEGATIVE NEGATIVE mg/dL   Protein, ur NEGATIVE NEGATIVE mg/dL   Nitrite NEGATIVE NEGATIVE   Leukocytes,Ua TRACE (A) NEGATIVE   RBC / HPF 0-5 0 - 5 RBC/hpf   WBC, UA 6-10 0 - 5 WBC/hpf   Bacteria, UA RARE (A) NONE SEEN   Squamous Epithelial / LPF 0-5 0 - 5   Mucus PRESENT   Glucose, capillary     Status: Abnormal   Collection Time: 01/30/22 11:45 AM  Result Value Ref Range   Glucose-Capillary 126 (H) 70 - 99 mg/dL  Glucose, capillary     Status: Abnormal   Collection Time: 01/30/22  4:13 PM  Result Value Ref Range   Glucose-Capillary 234 (H) 70 - 99 mg/dL     US Abdomen Limited RUQ (LIVER/GB)  Result Date: 01/30/2022 CLINICAL DATA:   Cholelithiasis. EXAM: ULTRASOUND ABDOMEN LIMITED RIGHT UPPER QUADRANT COMPARISON:  Right upper quadrant abdominal ultrasound 05/06/2017, CT abdomen and pelvis without contrast 01/30/2022 FINDINGS: The study is technically limited due to overlying bowel-gas. Gallbladder: The gallbladder wall thickness measures 4 mm diffusely, mildly thickened. No gallstone is seen. Negative sonographic Murphy's sign. No pericholecystic fluid. Common bile duct: Diameter: 5 mm, within normal limits. Liver: No focal lesion identified. Limited visualization of the left hepatic lobe due to overlying bowel gas. Within normal limits in parenchymal echogenicity. Portal vein is patent on color Doppler imaging with normal direction of blood flow towards the liver. Other: None. IMPRESSION: There is mild diffuse gallbladder wall thickening, however otherwise there is no sonographic evidence of acute cholecystitis. No gallstones. Gallbladder wall thickening is nonspecific and can be seen secondary to increased systemic fluid status and other etiologies including hypoalbuminemia, hepatitis, and chronic cholecystitis. Electronically Signed   By: Yvonne Kendall M.D.   On: 01/30/2022 13:58   CT ABDOMEN PELVIS WO CONTRAST  Result Date: 01/30/2022 CLINICAL DATA:  LEFT LOWER QUADRANT abdominal pain. Bowel obstruction suspected. EXAM: CT ABDOMEN AND PELVIS WITHOUT CONTRAST TECHNIQUE: Multidetector CT imaging of the abdomen and pelvis was  performed following the standard protocol without IV contrast. RADIATION DOSE REDUCTION: This exam was performed according to the departmental dose-optimization program which includes automated exposure control, adjustment of the mA and/or kV according to patient size and/or use of iterative reconstruction technique. COMPARISON:  01/29/2022, 01/22/2022 FINDINGS: Lower chest: Interval resolution of bilateral pleural effusions. There is minimal residual consolidation in the RIGHT LOWER lobe and minimal subsegmental  atelectasis in the RIGHT LOWER lobe. Heart size is normal. No pericardial effusion. Hepatobiliary: Noncontrast appearance of the liver is unremarkable. Gallbladder is partially obscured by contrast in the colon. However, gallbladder wall is possibly thickened, measuring up to 8 millimeters. Suspect radiopaque stone within the gallbladder. Pancreas:Partially obscured by contrast material. No abnormality identified. Spleen: Normal in size without focal abnormality. Adrenals/Urinary Tract: Adrenal glands are unremarkable. Kidneys are normal, without renal calculi, focal lesion, or hydronephrosis. Interval resolution of bilateral hydronephrosis. Bladder is distended and normal in appearance. There has been resolution of bladder wall thickening since prior study. Stomach/Bowel: Status post gastric bypass surgery without evidence for obstruction. Visualized small bowel loops are unremarkable. Small bowel anastomosis in the LEFT LOWER QUADRANT. There is residual contrast in the colon, with dilatation of the cecum, measuring up to 8.4 centimeters. No colonic mass. Vascular/Lymphatic: There is atherosclerotic calcification of the abdominal aorta. Limited evaluation of vascular structures due to lack of intravenous contrast. Reproductive: Prior hysterectomy.  No adnexal mass. Other: No ascites. Body wall edema. Subcutaneous gas in the RIGHT flank, consistent with prior injection. Musculoskeletal: Remote wedge compression fracture of T9. No suspicious lytic or blastic lesions. IMPRESSION: 1. Improvement in bilateral hydronephrosis and bladder wall thickening. 2. Postoperative changes after gastric bypass surgery. 3. Improvement in RIGHT LOWER lobe consolidation and interval resolution of bilateral pleural effusions. 4. Suspect gallbladder wall thickening. Suspect radiopaque calculus. Consider further evaluation with RIGHT UPPER QUADRANT ultrasound. 5. Dilatation and retained contrast in the colon, without evidence for large  bowel obstruction. 6.  Aortic atherosclerosis.  (ICD10-I70.0) 7. Hysterectomy. 8. Anasarca. 9. Remote wedge compression fracture T9. Electronically Signed   By: Nolon Nations M.D.   On: 01/30/2022 12:01   DG Abd 1 View  Result Date: 01/29/2022 CLINICAL DATA:  Abdominal pain EXAM: ABDOMEN - 1 VIEW COMPARISON:  Previous studies including the examination done earlier today FINDINGS: Bowel gas pattern is nonspecific. There is moderate distention of ascending and transverse colon. Oral contrast seen in the small bowel loops has moved into the colon. There is interval emptying of bowel contents in the left colon since the previous study. IMPRESSION: There is no small bowel dilation. There is moderate distention of right colon, possibly suggesting ileus. There is interval passage of most of the small bowel contrast into the colon and emptying of contrast from the left colon since the study done earlier today. Electronically Signed   By: Elmer Picker M.D.   On: 01/29/2022 21:08   DG Abd Portable 1V  Result Date: 01/29/2022 CLINICAL DATA:  Abdominal distension beginning today. EXAM: PORTABLE ABDOMEN - 1 VIEW COMPARISON:  01/27/2022 FINDINGS: Contrast remains present within small and large bowel, likely indicating poor gastrointestinal motility and or constipation. No true obstructive pattern. IMPRESSION: Previously ingested contrast remains within small bowel and large bowel, likely indicating poor gastrointestinal motility and or constipation. No sign of frank bowel obstruction. Electronically Signed   By: Nelson Chimes M.D.   On: 01/29/2022 10:50    ROS:  As stated above in the HPI otherwise negative.  Blood pressure 130/68, pulse (!) 105,  temperature 98 F (36.7 C), temperature source Oral, resp. rate (!) 34, height 5' (1.524 m), weight 43.2 kg, last menstrual period 01/08/2011, SpO2 96 %.    PE: Gen: NAD, Alert and Oriented HEENT:  [place]/AT, EOMI Neck: Supple, no LAD Lungs: CTA Bilaterally CV: RRR  without M/G/R ABD: Soft, distended, some tenderness with palpation, tympanic, minimal bowel sounds Ext: No C/C/E  Assessment/Plan: 1) Colonic ileus. 2) Abdominal pain. 3) S/p suicide attempt. 4) S/p tracheostomy.   The patient was able to ambulate today, but she appears very tired.  Her cecum was measured to be 8 cm and this needs to be monitored.  >12 cm there is a risk for perforation.  Promethazine can cause constipation, but the half life is around 10-14 hours.  The day of admission was on 01/12/2022.  Her persistent ileus is from her current acute medical state.  Plan: 1) Continue to encourage ambulation. 2) Rotate the patient from side to side every two hours. 3) Serial KUBs daily.   4) Rectal tube. 5) Monitor and correct electrolytes. 6) Okay with linaclotide. 7) Okay with metoclopramide for now.  Watch for any mental status changes. 8) If there is any acute worsening of her pain, or if the cecal diameter is >12 cm, an emergent decompressive colonoscopy will be required.  Damontae Loppnow D 01/30/2022, 5:20 PM

## 2022-01-30 NOTE — Progress Notes (Signed)
Physical Therapy Treatment Patient Details Name: [name] MRN: [id number] DOB: [birth year] Today's Date: 01/30/2022   History of Present Illness 56 y.o. female admitted 01/12/22 after suicide attempt with intentional promethazine OD. EEG without seizures; showed evidence of epileptogenicity. ETT 7/23-7/25. Reintubated 7/26. Ileus. 7/30 extubated but then trached and returned to vent. PMHx:Afib, COPD, DM, neuropathy, HTN, bipolar disorder, schizophrenia, anxiety, depression, ETOH abuse.    PT Comments    Pt received supine, agreeable and eager for OOB mobility. Pt with increased O2 demand this session requiring 6L to maintain SpO2 >92% during activity. Pt demonstrating ambulation with RW and supervision with light cueing for direction and navigation. Pt continues to demonstrate some continued impulsivity throughout session despite cues and education. Pt continues to benefit from skilled PT services to progress toward functional mobility goals.    Recommendations for follow up therapy are one component of a multi-disciplinary discharge planning process, led by the attending physician.  Recommendations may be updated based on patient status, additional functional criteria and insurance authorization.  Follow Up Recommendations  Skilled nursing-short term rehab (<3 hours/day) Can patient physically be transported by private vehicle: Yes   Assistance Recommended at Discharge Frequent or constant Supervision/Assistance  Patient can return home with the following Assist for transportation;Help with stairs or ramp for entrance;Assistance with cooking/housework;Direct supervision/assist for medications management;Direct supervision/assist for financial management;A little help with walking and/or transfers;A little help with bathing/dressing/bathroom   Equipment Recommendations  Rolling walker (2 wheels);BSC/3in1    Recommendations for Other Services       Precautions / Restrictions  Precautions Precautions: Fall;Other (comment) Precaution Comments: trach, watch sats Restrictions Weight Bearing Restrictions: No     Mobility  Bed Mobility Overal bed mobility: Needs Assistance Bed Mobility: Supine to Sit, Sit to Sidelying     Supine to sit: Min assist Sit to supine: Min guard   General bed mobility comments: min guard for safety. pt reaching for theis PTA arm when returning to supine    Transfers Overall transfer level: Needs assistance Equipment used: Rolling walker (2 wheels) Transfers: Sit to/from Stand Sit to Stand: Min guard, Supervision           General transfer comment: min guard to come to standing for safety    Ambulation/Gait Ambulation/Gait assistance: Min assist, Min guard Gait Distance (Feet): 360 Feet Assistive device: Rolling walker (2 wheels) Gait Pattern/deviations: Step-through pattern, Decreased stride length       General Gait Details: SpO2 92-96% on 6L non rebreather mask over trach, dropping to 88% x2 with pt couching to clear secretions during standing rest with SpO2 recovering <30 seconds   Stairs             Wheelchair Mobility    Modified Rankin (Stroke Patients Only)       Balance Overall balance assessment: Needs assistance Sitting-balance support: No upper extremity supported, Feet supported Sitting balance-Leahy Scale: Good Sitting balance - Comments: sitting without support   Standing balance support: During functional activity, Bilateral upper extremity supported Standing balance-Leahy Scale: Poor Standing balance comment: RW for gait                            Cognition Arousal/Alertness: Awake/alert Behavior During Therapy: Flat affect, Impulsive Overall Cognitive Status: Impaired/Different from baseline Area of Impairment: Safety/judgement, Attention, Following commands, Problem solving                 Orientation Level: Disoriented to, Time Current  Attention Level:  Sustained Memory: Decreased short-term memory Following Commands: Follows one step commands with increased time Safety/Judgement: Decreased awareness of safety, Decreased awareness of deficits   Problem Solving: Slow processing, Decreased initiation General Comments: pt with continued difficulty following directions with continued impulsivity        Exercises      General Comments General comments (skin integrity, edema, etc.): 10L 40% at rest on arrival, ambulating on 6L via NRB      Pertinent Vitals/Pain Pain Assessment Pain Assessment: Faces Faces Pain Scale: No hurt Pain Intervention(s): Monitored during session    Home Living                          Prior Function            PT Goals (current goals can now be found in the care plan section) Acute Rehab PT Goals PT Goal Formulation: Patient unable to participate in goal setting Time For Goal Achievement: 01/31/22    Frequency    Min 3X/week      PT Plan Current plan remains appropriate    Co-evaluation              AM-PAC PT "6 Clicks" Mobility   Outcome Measure  Help needed turning from your back to your side while in a flat bed without using bedrails?: A Little Help needed moving from lying on your back to sitting on the side of a flat bed without using bedrails?: A Little Help needed moving to and from a bed to a chair (including a wheelchair)?: A Little Help needed standing up from a chair using your arms (e.g., wheelchair or bedside chair)?: A Little Help needed to walk in hospital room?: A Little Help needed climbing 3-5 steps with a railing? : A Lot 6 Click Score: 17    End of Session Equipment Utilized During Treatment: Oxygen Activity Tolerance: Patient tolerated treatment well Patient left: in bed;with call bell/phone within reach;with family/visitor present;with nursing/sitter in room Nurse Communication: Mobility status PT Visit Diagnosis: Other abnormalities of gait and  mobility (R26.89);Difficulty in walking, not elsewhere classified (R26.2);Muscle weakness (generalized) (M62.81)     Time: 8563-1497 PT Time Calculation (min) (ACUTE ONLY): 26 min  Charges:  $Gait Training: 8-22 mins $Therapeutic Activity: 8-22 mins                     Shelise Maron R. PTA Acute Rehabilitation Services Office: [place] 01/30/2022, 4:17 PM

## 2022-01-30 NOTE — Progress Notes (Signed)
Occupational Therapy Treatment Patient Details Name: [name] MRN: [id number] DOB: Nov 15, 1965 Today's Date: 01/30/2022   History of present illness 56 y.o. female admitted 01/12/22 after suicide attempt with intentional promethazine OD. EEG without seizures; showed evidence of epileptogenicity. ETT 7/23-7/25. Reintubated 7/26. Ileus. 7/30 extubated but then trached and returned to vent. PMHx:Afib, COPD, DM, neuropathy, HTN, bipolar disorder, schizophrenia, anxiety, depression, ETOH abuse.   OT comments  Pt with abdominal discomfort and on 8L 02, 35% with 02 sat at 92%. Needing encouragement to sit EOB for CHG bath by NT, gown change and grooming with OT. Asking for drink, self fed thickened water with straw. Pt immediately wanting to return to sidelying. Updated goals and discharge disposition.    Recommendations for follow up therapy are one component of a multi-disciplinary discharge planning process, led by the attending physician.  Recommendations may be updated based on patient status, additional functional criteria and insurance authorization.    Follow Up Recommendations  Skilled nursing-short term rehab (<3 hours/day)    Assistance Recommended at Discharge Frequent or constant Supervision/Assistance  Patient can return home with the following  A little help with walking and/or transfers;A lot of help with bathing/dressing/bathroom;Assistance with cooking/housework;Assistance with feeding;Direct supervision/assist for medications management;Direct supervision/assist for financial management;Assist for transportation;Help with stairs or ramp for entrance   Equipment Recommendations  Other (comment) (defer to next venue)    Recommendations for Other Services      Precautions / Restrictions Precautions Precautions: Fall;Other (comment) Precaution Comments: trach, watch sats       Mobility Bed Mobility Overal bed mobility: Needs Assistance Bed Mobility: Supine to Sit, Sit  to Sidelying     Supine to sit: Min assist   Sit to sidelying: Min guard General bed mobility comments: assist for hips to EOB with pad    Transfers                   General transfer comment: pt declined     Balance Overall balance assessment: Needs assistance   Sitting balance-Leahy Scale: Good                                     ADL either performed or assessed with clinical judgement   ADL Overall ADL's : Needs assistance/impaired   Eating/Feeding Details (indicate cue type and reason): per NT, pt self fed, asking for water, drank from straw with set up Grooming: Wash/dry face;Sitting;Set up Grooming Details (indicate cue type and reason): refused oral care         Upper Body Dressing : Maximal assistance;Sitting Upper Body Dressing Details (indicate cue type and reason): donning new gown                        Extremity/Trunk Assessment              Vision       Perception     Praxis      Cognition Arousal/Alertness: Awake/alert Behavior During Therapy: Flat affect, Impulsive Overall Cognitive Status: Impaired/Different from baseline Area of Impairment: Safety/judgement, Attention, Following commands, Problem solving                   Current Attention Level: Sustained   Following Commands: Follows one step commands with increased time Safety/Judgement: Decreased awareness of safety, Decreased awareness of deficits   Problem Solving: Slow processing, Decreased initiation General Comments:  pt with difficulty processing medical plan after MD conversation        Exercises      Shoulder Instructions       General Comments      Pertinent Vitals/ Pain       Pain Assessment Pain Assessment: Faces Faces Pain Scale: Hurts even more Pain Location: abdomen Pain Descriptors / Indicators: Grimacing, Guarding, Discomfort Pain Intervention(s): Monitored during session, Repositioned, Other (comment) (MD in  room and aware)  Home Living                                          Prior Functioning/Environment              Frequency  Min 2X/week        Progress Toward Goals  OT Goals(current goals can now be found in the care plan section)  Progress towards OT goals: Progressing toward goals  Acute Rehab OT Goals OT Goal Formulation: Patient unable to participate in goal setting Time For Goal Achievement: 02/13/22 Potential to Achieve Goals: Fair ADL Goals Pt Will Perform Grooming: with min assist;standing Pt Will Perform Upper Body Dressing: with min assist;sitting Pt Will Transfer to Toilet: with min assist;ambulating;bedside commode Additional ADL Goal #1: Pt will follow two step commands 50% of session Additional ADL Goal #2: pt will demonstrate selective attention during ADLs.  Plan Discharge plan needs to be updated    Co-evaluation                 AM-PAC OT "6 Clicks" Daily Activity     Outcome Measure   Help from another person eating meals?: A Little Help from another person taking care of personal grooming?: A Little Help from another person toileting, which includes using toliet, bedpan, or urinal?: A Lot Help from another person bathing (including washing, rinsing, drying)?: Total Help from another person to put on and taking off regular upper body clothing?: A Lot Help from another person to put on and taking off regular lower body clothing?: Total 6 Click Score: 12    End of Session Equipment Utilized During Treatment: Oxygen (8L,35%)  OT Visit Diagnosis: Unsteadiness on feet (R26.81);Muscle weakness (generalized) (M62.81)   Activity Tolerance Patient limited by pain;Patient limited by fatigue   Patient Left in bed;with call bell/phone within reach;with nursing/sitter in room   Nurse Communication          Time: 8676-1950 OT Time Calculation (min): 22 min  Charges: OT General Charges $OT Visit: 1 Visit OT  Treatments $Self Care/Home Management : 8-22 mins  Cleta Alberts, OTR/L Acute Rehabilitation Services Office: [phone]   [name] 01/30/2022, 8:49 AM

## 2022-01-30 NOTE — Progress Notes (Signed)
Noted sat 89% on 35% ATC.  Sx pt for  moderate thin clear/white sputum, sats still 89%.  Increased fio2 to 40% and 10 lpm ATC.  Sat now 92%.  Pt appears comfortable, no distress noted.  RN and MD notified.

## 2022-01-30 NOTE — Progress Notes (Signed)
NAME:  [name], MRN:  [id number], DOB:  February 16, 1966, LOS: 56 ADMISSION DATE:  01/12/2022, CONSULTATION DATE:  01/12/21 REFERRING MD:  Sedonia Small - EM, CHIEF COMPLAINT:  Intentional Overdose    History of Present Illness:  56 yo F PMH schizoaffective disorder (bipolar type), Depression, prior SI, polysubstance use disorder, COPD, HTN, DM presented to the ED following suicide attempt. Patient had been drinking, while with friends stated that she was going to kill herself and was witnessed ingesting a bottle of promethazine.   In transport with EMS became more somnolent. Arrived to ED with GCS 3 and was intubated in this setting. EM discussed case with poison control who recommended supportive care  PCCM consulted for admission in this setting.  Pertinent Medical History:  Schizoaffective disorder, bipolar type Depression Polysubstance abuse  COPD HTN DM    Significant Hospital Events: Including procedures, antibiotic start and stop dates in addition to other pertinent events   7/23 ED after etoh consumption and intentional promethazine overdose. Intubated. Admit to ICU  7/24 Tachycardic to 130s overnight, also with episode of whole body stiffening, ?dystonia vs. seizure. EEG with irritability, LTM initiated. Neuro consulted. 7/25 Extubated, intermittently somnolent/agitated, not managing secretions well. O2 sats stable on [place]. Cortrak difficult to flush. 7/26 Reintubated early AM for hypoxia, airway protection. Unasyn initiated for ?aspiration PNA. LTM EEG discontinued. MRI pending. 7/27 MRI no acute changes. Wakes up follows commands on .7 dex  7/28 off NE  01/19/2022>> Trach after goals of care discussion with daughter 7/31 Transitioned to ATC.  8/2 CT abdomen: Stool in right colon.  Normal appendix.  Placed gastric bypass surgery.  Nasogastric tube in stomach.  No evidence of wall thickening or obstruction and bowel.  Questionable cystitis.  Bibasilar pleural effusions.  Ceftriaxone  started.  Interim History / Subjective:  No acute issues  Objective:  Blood pressure 110/63, pulse (!) 103, temperature 99 F (37.2 C), temperature source Oral, resp. rate (!) 28, height 5' (1.524 m), weight 43.2 kg, last menstrual period 01/08/2011, SpO2 94 %.    FiO2 (%):  [28 %-35 %] 35 %   Intake/Output Summary (Last 24 hours) at 01/30/2022 1049 Last data filed at 01/30/2022 1013 Gross per 24 hour  Intake 2044.24 ml  Output 1200 ml  Net 844.24 ml   Filed Weights   01/28/22 0500 01/29/22 0500 01/30/22 0330  Weight: 42.9 kg 43 kg 43.2 kg   Physical Examination: General: No acute distress HEENT: #4 cuffless trach in place Neuro: Grossly intact CV: Heart sounds are regular PULM: Coughing   GI: soft, bsx4 active  GU: Voids Extremities: warm/dry, negative edema  Skin: no rashes or lesions     Resolved Hospital Problem List:   Hypotension, suspect sedation related   Acute toxic encephalopathy in the setting of drug overdose Hypokalemia  Assessment & Plan:  Acute toxic metabolic encephalopathy, initially in setting of intentional overdose- Mental status back to normal Seizure disorder SI with intentional promethazine overdose Schizoaffective disorder -- Bipolar type MDD  Polysubstance use disorder- Phenobarb taper completed. Improved IVC  Acute respiratory failure with hypoxia initially in setting of overdose, then 2/2 aspiration s/p trach Completed antibiotic therapy Continue diuresis Tobacco use disorder - Counseling provided regarding quit smoking COPD, not exacerbation Bronchodilators as needed Note during the night fever productive cough therefore no further decannulation  Ongoing abdominal pain, colonic dysmotility- now having BM; this is all related to anticholinergic effect of promethazine overdose.  Still has contrast in colon from 6 days  ago. Severe protein calorie malnutrition POA Continue current treatment per primary  Iron deficiency  anemia Continue to monitor  DM2 with hyperglycemia Per primary  Best Practice (right click and "Reselect all SmartList Selections" daily)   Diet/type: TPN, dysphagia diet DVT prophylaxis: SCD and heparin. GI prophylaxis: PPI Lines: N/A Foley:  N/A Code Status:  full code Last date of multidisciplinary goals of care discussion [spoke with daughter 8/2]    Richardson Landry Davanna He ACNP Acute Care Nurse Practitioner [place] Please consult Amion 01/30/2022, 10:49 AM

## 2022-01-30 NOTE — Progress Notes (Signed)
Starting getting restless, trying to get out of bed No  PRN order, Md made aware. Awaiting for order.

## 2022-01-30 NOTE — Progress Notes (Addendum)
PROGRESS NOTE    [name]  DGL:[id number] DOB: [birth year] DOA: 01/12/2022 PCP: [name], MD   Brief Narrative:  56 year old with schizoaffective disorder, depression, polysubstance abuse, COPD, HTN, DM2 for suicide attempt and overdosing on promethazine and alcohol.  Initially patient was intubated due to GCS of 3 complicated by aspiration pneumonia.  Eventually when trach placement on 7/30 and transition to trach collar 7/31.  Other complicated issues that she developed ileus, urinary retention with intermittent fluid overload.  Current events and procedures 7/23 ED after etoh consumption and intentional promethazine overdose. Intubated. Admit to ICU  7/24 Tachycardic to 130s overnight, also with episode of whole body stiffening, ?dystonia vs. seizure. EEG with irritability, LTM initiated. Neuro consulted. 7/25 Extubated, intermittently somnolent/agitated, not managing secretions well. O2 sats stable on [place]. Cortrak difficult to flush. 7/26 Reintubated early AM for hypoxia, airway protection. Unasyn initiated for ?aspiration PNA. LTM EEG discontinued. MRI pending. 7/27 MRI no acute changes. Wakes up follows commands on .7 dex  7/28 off NE  01/19/2022>> Trach after goals of care discussion with daughter 7/31 Transitioned to ATC.  8/2 CT abdomen: Stool in right colon.  Normal appendix.  Placed gastric bypass surgery.  Nasogastric tube in stomach.  No evidence of wall thickening or obstruction and bowel.  Questionable cystitis.  Bibasilar pleural effusions.  Ceftriaxone started. 01/28/2022.  Transferred to hospitalist service on day 16 of her hospital stay  Assessment & Plan:  Principal Problem:   Intentional overdose ([place]) Active Problems:   Hypokalemia   Protein-calorie malnutrition, severe   Ileus ([place])    Alcohol and promethazine overdose with likely attempted suicide with history of schizoaffective disorder bipolar type, history of polysubstance abuse.   Patient has been  seen by psychiatry-currently on Haldol and Ativan.  Plans to add antidepressant later this week.  Patient is IVC Needs inpatient psych  Obstipation with colonic dysmotility/ileus Acute urinary retention - Despite aggressive bowel regimen, Reglan, smog enema patient is still having significant abdominal pain.  Abdominal x-ray shows contrast but otherwise unrevealing.  Will get CT abdomen pelvis today. Dr Benson Norway from GI consulted who will see her later today  Fevers - Unknown source.  UA negative, will repeat blood cultures. Sputum Cultures as well.    ?? History of seizure disorder.   Initially there was question of seizures but the workup was overall negative therefore taken off Keppra by neurology    Microcytic anemia. -Low iron saturation, slightly low ferritin 56.  Will give IV iron X4 doses.  For now we will avoid p.o. due to issues with constipation.   Acute hypoxic respiratory failure secondary to aspiration Status post tracheostomy Tracheostomy placed due to recurrent intubation.  Has 6 mm cuffed [place].  Routine trach care.  Due to diet, getting TPN.  Pulmonary following. ProCal 0.24   Moderate protein calorie malnutrition.  -Dysphagia 2 diet and TPN.  RU E PICC line in place 7/28   History of tobacco abuse and COPD.   -As needed bronchodilators No acute issues counseled to quit tobacco and alcohol.   GERD.   -PPI nightly.  Simethicone.   DM type II -On sliding scale and Accu-Cheks     DVT prophylaxis: Subcu heparin Code Status: Full  Family Communication:  Noneat bedside   Status is: Inpatient Remains inpatient appropriate because: On going eval for fevers and abd pain   Nutritional status    Signs/Symptoms: severe fat depletion, severe muscle depletion  Interventions: Refer to RD note for recommendations, Ensure  Enlive (each supplement provides 350kcal and 20 grams of protein), MVI  Body mass index is 18.6 kg/m.         Subjective: Still having  left sided abd pain, not much better.   Examination:  Constitutional: Not in acute distress Respiratory: ant rhocnhi Cardiovascular: Normal sinus rhythm, no rubs Abdomen: tender to palpation in LLQ, not distended.  Musculoskeletal: No edema noted Skin: No rashes seen Neurologic: CN 2-12 grossly intact.  And nonfocal Psychiatric: Normal judgment and insight. Alert and oriented x 3. Normal mood.   Tracheostomy noted PICC line noted   Objective: Vitals:   01/30/22 0739 01/30/22 0758 01/30/22 1045 01/30/22 1152  BP:  110/63  (!) 105/56  Pulse: (!) 106 (!) 103 88 84  Resp: (!) 25 (!) 28 (!) 24 (!) 29  Temp:  99 F (37.2 C)  100.1 F (37.8 C)  TempSrc:  Oral  Axillary  SpO2: 95% 94% 98% 95%  Weight:      Height:        Intake/Output Summary (Last 24 hours) at 01/30/2022 1157 Last data filed at 01/30/2022 1100 Gross per 24 hour  Intake 2114.24 ml  Output 1000 ml  Net 1114.24 ml   Filed Weights   01/28/22 0500 01/29/22 0500 01/30/22 0330  Weight: 42.9 kg 43 kg 43.2 kg     Data Reviewed:   CBC: Recent Labs  Lab 01/24/22 1105 01/28/22 0635  WBC 14.5* 14.2*  NEUTROABS 12.5*  --   HGB 7.5* 7.3*  HCT 26.1* 25.1*  MCV 73.3* 70.3*  PLT 296 665*   Basic Metabolic Panel: Recent Labs  Lab 01/24/22 0503 01/25/22 0413 01/26/22 0416 01/27/22 0430 01/28/22 0635 01/29/22 0430 01/30/22 0624  NA 136   < > 131* 132* 133* 136 134*  K 3.7   < > 4.2 4.1 4.2 4.4 4.9  CL 100   < > 93* 93* 98 101 102  CO2 29   < > '28 30 28 29 26  '$ GLUCOSE 211*   < > 251* 268* 186* 88 106*  BUN 10   < > '10 10 17 15 19  '$ CREATININE <0.30*   < > 0.32* 0.38* 0.32* 0.37* 0.48  CALCIUM 8.4*   < > 7.8* 8.4* 8.3* 8.4* 8.3*  MG 1.9   < > 2.0 1.8 2.0 2.1 2.1  PHOS 3.5  --   --  4.5  --  4.5 5.0*   < > = values in this interval not displayed.   GFR: Estimated Creatinine Clearance: 52.9 mL/min (by C-G formula based on SCr of 0.48 mg/dL). Liver Function Tests: Recent Labs  Lab 01/27/22 0430  01/30/22 0624  AST 56* 56*  ALT 87* 82*  ALKPHOS 89 137*  BILITOT 0.2* 0.2*  PROT 7.9 7.4  ALBUMIN 1.8* 1.6*   Recent Labs  Lab 01/30/22 0708  LIPASE 141*   No results for input(s): "AMMONIA" in the last 168 hours. Coagulation Profile: No results for input(s): "INR", "PROTIME" in the last 168 hours. Cardiac Enzymes: No results for input(s): "CKTOTAL", "CKMB", "CKMBINDEX", "TROPONINI" in the last 168 hours. BNP (last 3 results) No results for input(s): "PROBNP" in the last 8760 hours. HbA1C: No results for input(s): "HGBA1C" in the last 72 hours. CBG: Recent Labs  Lab 01/29/22 2355 01/30/22 0312 01/30/22 0352 01/30/22 0833 01/30/22 1145  GLUCAP 171* 64* 81 171* 126*   Lipid Profile: No results for input(s): "CHOL", "HDL", "LDLCALC", "TRIG", "CHOLHDL", "LDLDIRECT" in the last 72 hours.  Thyroid Function  Tests: Recent Labs    01/28/22 1030  TSH 3.043   Anemia Panel: Recent Labs    01/28/22 1017 01/28/22 1018 01/29/22 0600  VITAMINB12  --  1,070*  --   FOLATE 9.9  --   --   FERRITIN  --  56  --   TIBC  --  287  --   IRON  --  13*  --   RETICCTPCT  --   --  RESULTS UNAVAILABLE DUE TO INTERFERING SUBSTANCE   Sepsis Labs: Recent Labs  Lab 01/24/22 1449 01/28/22 0635 01/29/22 0430 01/30/22 0624  PROCALCITON  --  <0.10 0.24 0.63  LATICACIDVEN 2.8*  --   --   --     No results found for this or any previous visit (from the past 240 hour(s)).       Radiology Studies: DG Abd 1 View  Result Date: 01/29/2022 CLINICAL DATA:  Abdominal pain EXAM: ABDOMEN - 1 VIEW COMPARISON:  Previous studies including the examination done earlier today FINDINGS: Bowel gas pattern is nonspecific. There is moderate distention of ascending and transverse colon. Oral contrast seen in the small bowel loops has moved into the colon. There is interval emptying of bowel contents in the left colon since the previous study. IMPRESSION: There is no small bowel dilation. There is  moderate distention of right colon, possibly suggesting ileus. There is interval passage of most of the small bowel contrast into the colon and emptying of contrast from the left colon since the study done earlier today. Electronically Signed   By: Elmer Picker M.D.   On: 01/29/2022 21:08   DG Abd Portable 1V  Result Date: 01/29/2022 CLINICAL DATA:  Abdominal distension beginning today. EXAM: PORTABLE ABDOMEN - 1 VIEW COMPARISON:  01/27/2022 FINDINGS: Contrast remains present within small and large bowel, likely indicating poor gastrointestinal motility and or constipation. No true obstructive pattern. IMPRESSION: Previously ingested contrast remains within small bowel and large bowel, likely indicating poor gastrointestinal motility and or constipation. No sign of frank bowel obstruction. Electronically Signed   By: Nelson Chimes M.D.   On: 01/29/2022 10:50        Scheduled Meds:  vitamin C  500 mg Oral Daily   bisacodyl  10 mg Rectal BID   Chlorhexidine Gluconate Cloth  6 each Topical Daily   cyanocobalamin  1,000 mcg Intramuscular Q7 days   docusate sodium  100 mg Oral BID   feeding supplement  237 mL Oral BID BM   furosemide  40 mg Oral Daily   guaiFENesin  600 mg Oral BID   haloperidol lactate  2 mg Intravenous BID   And   LORazepam  1 mg Intravenous BID   heparin injection (subcutaneous)  5,000 Units Subcutaneous Q8H   insulin aspart  0-6 Units Subcutaneous Q4H   lactulose  30 g Oral TID   linaclotide  145 mcg Oral QAC breakfast   metoCLOPramide (REGLAN) injection  5 mg Intravenous Q6H   metoprolol tartrate  25 mg Oral BID   mouth rinse  15 mL Mouth Rinse 4 times per day   pantoprazole  40 mg Oral QHS   revefenacin  175 mcg Nebulization Daily   simethicone  40 mg Oral QID   sodium chloride flush  10-40 mL Intracatheter Q12H   vitamin A  50,000 Units Intramuscular Daily   Followed by   Derrill Memo ON 02/11/2022] vitamin A  10,000 Units Oral Daily   Vitamin D (Ergocalciferol)   50,000 Units Oral Q7  days   Continuous Infusions:  sodium chloride Stopped (01/28/22 1141)   ferric gluconate (FERRLECIT) IVPB 250 mg (01/30/22 0958)   TPN ADULT (ION) 70 mL/hr at 01/30/22 1100   TPN ADULT (ION)       LOS: 18 days   Time spent= 35 mins    Kmari Halter Arsenio Loader, MD [facility]  If 7PM-7AM, please contact night-coverage  01/30/2022, 11:57 AM

## 2022-01-30 NOTE — Consult Note (Signed)
  Attempted to see and assess patient off unit for CT abdomen and pelvis. She has received prn haldol and ativan for agitation that developed overnight 2/t pain.   Psychiatry continues to follow at this time. Will attempt to reassess tomorrow.

## 2022-01-30 NOTE — Progress Notes (Signed)
TR[facility] night cross cover note:   I was notified by RN that the patient is complaining of some worsening abdominal discomfort in the left lower quadrant.  The patient reportedly had abdominal discomfort earlier in the day, the severity of such as increased this evening following enema administered during dayshift.  Abdomen was previously noted to be mildly distended, without significant worsening swelling overnight shift, no significant rigidity noted.   Per my chart review, including review of most recent rounding hospitalist progress note, abdominal plain film from the morning of 01/29/2022 showed abdominal distention, with evidence of poor GI motility and/for constipation, without any evidence of overt bowel obstruction and no evidence of perforation.  She has noted to be on aggressive oral bowel regimen.   Given interval increase in the severity of the patient's abdominal discomfort, I ordered repeat abdominal plain film, which, again, showed no evidence of small bowel obstruction, showing moderate distention of the right colon, potentially representing ileus, without overt evidence of large bowel obstruction, completing interval passage of majority of the small bowel contrast into the colon, with emptying of the contrast into the left-sided colon.   Given concern for a degree of potential constipation/ileus, refrain from aggressive review of opioid analgesia for the patient's worsening abdominal discomfort.  Rather, I ordered fentanyl 25 mcg IV x 1 dose. Refrained from NSAID due h/o microcytic anemia. Also ordered PVR bladder scan.     Babs Bertin, DO Hospitalist

## 2022-01-30 NOTE — Progress Notes (Signed)
Coordinated with sitter to start documenting calorie intake starting with dinner.

## 2022-01-30 NOTE — Progress Notes (Signed)
PHARMACY - TOTAL PARENTERAL NUTRITION CONSULT NOTE  Indication: Prolonged ileus  Patient Measurements: Height: 5' (152.4 cm) Weight: 43.2 kg (95 lb 3.8 oz) IBW/kg (Calculated) : 45.5 TPN AdjBW (KG): 42.2 Body mass index is 18.6 kg/m. Usual weight:  240 lbs in 2017, now 88 lbs with significant weight loss over the past 3 yrs  Assessment:  22 YOF with history of polysubstance abuse and bariatric surgery presented on 7/23 s/p intentional overdose.  CT and abd XR showed large amount of stool in the colon and an ileus. She has severe PCM with minimal nutrition since admit (TF from 7/23 until 7/25), so Pharmacy consulted to manage TPN.  Pt removed CorTrak on 8/4 and TFs were discontinued at 1639. Pt passed swallow eval for Dysphagia 2 diet. Pt has eaten ~25-50% of her meals. Dietician is performing a calorie count.  Glucose / Insulin: hx DM on ?metformin PTA - hypoglycemic x1 on 8/9  CBGs increased 64-187. 9 units on sSSI in the past 24 hrs. Glargine discontinued. BG trend improving. - decreased to 15 units in TPN  Electrolytes: Na 134, Cl 102, CoCa 10.22, others WNL Renal: SCr 0.48 stable, BUN 19 stable Hepatic: LFTs mildly elevated/rising, tbili/TG WNL, albumin 1.6 Vitamin/Mineral panel: low Vit D/A/C.  Vitamins B1/B12, copper, zinc and folate WNL. Intake / Output; MIVF: UOP 2.5 > 0.7 ml/kg/hr, LBM on 8/09, no overt volume overload noted on exam   GI Imaging:  8/2 CT Abd/P: pleural effusions, B/L renal collecting system dilatation with concern of cystitis GI Surgeries / Procedures: none since TPN initiation   Central access: PICC placed 01/17/22 TPN start date: 01/18/22  Nutritional Goals: RD Estimated Needs Total Energy Estimated Needs: 1600-1800 kcals Total Protein Estimated Needs: 100-115 g Total Fluid Estimated Needs: >/= 1.6 L  Current Nutrition:  Soft diet and TPN  Plan:  Continue TPN to goal rate of 70 ml/hr to provide 86g AA, 244g CHO and 44g ILE for a total of 1607 kCal,  meeting 100% of needs Electrolytes in TPN: Na 50 mEq/L, K 55 mEq/L, Ca 5 mEq/L, Mg 7 mEq/L, decrease Phos to 5 mmol/L, JK:[id number] ratio 2:1 15 units regular insulin to TPN Add standard MVI and trace elements to TPN  Change moderate SSI frequency to TID AC Standard TPN labs on Mon and Thurs Started Vit A, D and C supplementation on 8/5    > Vitamin A 100,000 units IM daily x 3 days, followed by 50,000 units IM daily x 14 days, followed by 10,000 units PO daily x 60 days   > Vitamin D2 (ergocalciferol) 50,000 units PO once weekly x 8 weeks   > Vitamin C (ascorbic acid) '500mg'$  PO daily x28 days F/u on PO diet intake, calorie count per dietician, and plan for weaning TPN.   Thank you for allowing pharmacy to be a part of this patient's care.  Ardyth Harps, PharmD Clinical Pharmacist

## 2022-01-30 NOTE — Progress Notes (Signed)
Brief Nutrition Note:   Dietitian Consult received for assessment of po intake, need for TPN.  Calorie count has been ordered for pt but limited documentation in past 24-48 hour.   Pt ate 25% at breakfast this AM; drinking some Ensure per report. On visit today, pt lethargic/sleeping after receiving haldol and hand not touched lunch tray. Mother at bedside and reports pt does usually eat some magic cup but not the entire container. Per nursing staff, pt drinks fluids very well but solid intake is limited.   Noted GI has been consulted which I agree will be of benefit. CT abdomen previously unremarkable except for retained contrast in the colon.  Discussed nutrition concerns with Dr. Reesa Chew. Pt with severe malnutrition with hx of gastric bypass. Pt is not taking in adequate intake. Barriers to adequate po intake include chronic abdominal pain, early satiety, dysphagia diet with thickened liquids, AMS.  Plan to continue Calorie Count for the next 24-48 hours or so. Discussed importance of accurate documentation with RN. Increase Ensure to with meals and at bedtime, continue Magic Cup. Plan to reassess in the next day or so and make further decisions regarding need for continued nutrition support. Continue TPN to meet 100% of nutritional needs in the interim. Also discussed that it would be appropriate to transition from TPN to TF to wean pt off TPN if after calorie count, pt still requires nutrition support as pt is tolerating po.   Kerman Passey MS, RDN, LDN, CNSC Registered Dietitian 3 Clinical Nutrition RD Pager and On-Call Pager Number Located in [place]

## 2022-01-31 ENCOUNTER — Inpatient Hospital Stay (HOSPITAL_COMMUNITY): Payer: Medicare Other

## 2022-01-31 DIAGNOSIS — E876 Hypokalemia: Secondary | ICD-10-CM | POA: Diagnosis not present

## 2022-01-31 DIAGNOSIS — K567 Ileus, unspecified: Secondary | ICD-10-CM | POA: Diagnosis not present

## 2022-01-31 DIAGNOSIS — E43 Unspecified severe protein-calorie malnutrition: Secondary | ICD-10-CM | POA: Diagnosis not present

## 2022-01-31 DIAGNOSIS — T50902D Poisoning by unspecified drugs, medicaments and biological substances, intentional self-harm, subsequent encounter: Secondary | ICD-10-CM | POA: Diagnosis not present

## 2022-01-31 LAB — CBC
HCT: 23.1 % — ABNORMAL LOW (ref 36.0–46.0)
Hemoglobin: 7 g/dL — ABNORMAL LOW (ref 12.0–15.0)
MCH: 22.2 pg — ABNORMAL LOW (ref 26.0–34.0)
MCHC: 30.3 g/dL (ref 30.0–36.0)
MCV: 73.3 fL — ABNORMAL LOW (ref 80.0–100.0)
Platelets: 455 K/uL — ABNORMAL HIGH (ref 150–400)
RBC: 3.15 MIL/uL — ABNORMAL LOW (ref 3.87–5.11)
RDW: 29.7 % — ABNORMAL HIGH (ref 11.5–15.5)
WBC: 20.5 K/uL — ABNORMAL HIGH (ref 4.0–10.5)
nRBC: 0 % (ref 0.0–0.2)

## 2022-01-31 LAB — MAGNESIUM: Magnesium: 1.8 mg/dL (ref 1.7–2.4)

## 2022-01-31 LAB — GLUCOSE, CAPILLARY
Glucose-Capillary: 179 mg/dL — ABNORMAL HIGH (ref 70–99)
Glucose-Capillary: 192 mg/dL — ABNORMAL HIGH (ref 70–99)
Glucose-Capillary: 198 mg/dL — ABNORMAL HIGH (ref 70–99)
Glucose-Capillary: 203 mg/dL — ABNORMAL HIGH (ref 70–99)
Glucose-Capillary: 214 mg/dL — ABNORMAL HIGH (ref 70–99)
Glucose-Capillary: 234 mg/dL — ABNORMAL HIGH (ref 70–99)

## 2022-01-31 LAB — BASIC METABOLIC PANEL WITH GFR
Anion gap: 5 (ref 5–15)
BUN: 13 mg/dL (ref 6–20)
CO2: 25 mmol/L (ref 22–32)
Calcium: 8.2 mg/dL — ABNORMAL LOW (ref 8.9–10.3)
Chloride: 101 mmol/L (ref 98–111)
Creatinine, Ser: 0.34 mg/dL — ABNORMAL LOW (ref 0.44–1.00)
GFR, Estimated: >60 mL/min (ref >60)
Glucose, Bld: 157 mg/dL — ABNORMAL HIGH (ref 70–99)
Potassium: 4.2 mmol/L (ref 3.5–5.1)
Sodium: 131 mmol/L — ABNORMAL LOW (ref 135–145)

## 2022-01-31 LAB — HEPATIC FUNCTION PANEL
ALT: 72 U/L — ABNORMAL HIGH (ref 0–44)
AST: 37 U/L (ref 15–41)
Albumin: 1.6 g/dL — ABNORMAL LOW (ref 3.5–5.0)
Alkaline Phosphatase: 146 U/L — ABNORMAL HIGH (ref 38–126)
Bilirubin, Direct: <0.1 mg/dL (ref 0.0–0.2)
Total Bilirubin: 0.2 mg/dL — ABNORMAL LOW (ref 0.3–1.2)
Total Protein: 7 g/dL (ref 6.5–8.1)

## 2022-01-31 LAB — CK: Total CK: 13 U/L — ABNORMAL LOW (ref 38–234)

## 2022-01-31 LAB — LACTIC ACID, PLASMA: Lactic Acid, Venous: 1.3 mmol/L (ref 0.5–1.9)

## 2022-01-31 LAB — AMMONIA: Ammonia: 53 umol/L — ABNORMAL HIGH (ref 9–35)

## 2022-01-31 MED ORDER — SODIUM CHLORIDE 0.9 % IV SOLN
2.0000 g | Freq: Two times a day (BID) | INTRAVENOUS | Status: DC
  Administered 2022-01-31 – 2022-02-06 (×13): 2 g via INTRAVENOUS
  Filled 2022-01-31 (×14): qty 12.5

## 2022-01-31 MED ORDER — METRONIDAZOLE 500 MG/100ML IV SOLN
500.0000 mg | Freq: Two times a day (BID) | INTRAVENOUS | Status: DC
Start: 2022-01-31 — End: 2022-02-07
  Administered 2022-01-31 – 2022-02-06 (×13): 500 mg via INTRAVENOUS
  Filled 2022-01-31 (×14): qty 100

## 2022-01-31 MED ORDER — HALOPERIDOL LACTATE 5 MG/ML IJ SOLN
2.0000 mg | Freq: Four times a day (QID) | INTRAMUSCULAR | Status: DC | PRN
  Administered 2022-01-31 – 2022-02-07 (×11): 2 mg via INTRAVENOUS
  Filled 2022-01-31 (×14): qty 1

## 2022-01-31 MED ORDER — SIMETHICONE 80 MG PO CHEW
40.0000 mg | CHEWABLE_TABLET | Freq: Three times a day (TID) | ORAL | Status: DC
  Administered 2022-01-31 – 2022-02-10 (×39): 40 mg via ORAL
  Filled 2022-01-31 (×38): qty 1

## 2022-01-31 MED ORDER — INSULIN ASPART 100 UNIT/ML IJ SOLN
0.0000 [IU] | Freq: Four times a day (QID) | INTRAMUSCULAR | Status: DC
  Administered 2022-01-31: 1 [IU] via SUBCUTANEOUS
  Administered 2022-01-31: 2 [IU] via SUBCUTANEOUS
  Administered 2022-02-01: 1 [IU] via SUBCUTANEOUS
  Administered 2022-02-01: 2 [IU] via SUBCUTANEOUS

## 2022-01-31 MED ORDER — TRAVASOL 10 % IV SOLN
INTRAVENOUS | Status: DC
  Filled 2022-01-31: qty 856.8

## 2022-01-31 MED ORDER — FUROSEMIDE 10 MG/ML IJ SOLN
40.0000 mg | Freq: Once | INTRAMUSCULAR | Status: AC
  Administered 2022-01-31: 40 mg via INTRAVENOUS
  Filled 2022-01-31: qty 4

## 2022-01-31 NOTE — Progress Notes (Addendum)
Daughter updated at length again today.  Her MEWS is elevated. Currently she is already on Abx. CXR repeated showing some effusion, cont currently treatment. She received lasix IV earlier today, responding well. Bcx from yesterday remains neg. UA showed mild UTI but current Abx should cover it.  No urinary retention on Bladder scan. Elevated ammonia, will order lactulose. Lactate is normal.  Daughter ok with PRBC TX if Hb falls < 7.  Will repeat AXR tomorrow.

## 2022-01-31 NOTE — Progress Notes (Signed)
Mobility Specialist Progress Note    01/31/22 1106  Mobility  Activity Ambulated with assistance in hallway  Level of Assistance Contact guard assist, steadying assist  Assistive Device Front wheel walker  Distance Ambulated (ft) 100 ft  Activity Response Tolerated well  $Mobility charge 1 Mobility   Post-Mobility: 92 HR, 93% SpO2  Pt received sitting EOB and agreeable. Pt impulsive w/ wanting to get up. C/o being tired and in pain. Returned to bed with RT present.   Hildred Alamin Mobility Specialist

## 2022-01-31 NOTE — TOC Progression Note (Signed)
Transition of Care [facility]) - Progression Note    Patient Details  Name: [name] MRN: [id number] Date of Birth: February 12, 1966  Transition of Care [facility]) CM/SW [place], RN Phone Number:[phone]  01/31/2022, 12:56 PM  Clinical Narrative:    TOC continues to follow. Barrier for inpatient psych includes new trach. SW following for disposition needs.    Expected Discharge Plan:  (inpatient psych) Barriers to Discharge: Continued Medical Work up  Expected Discharge Plan and Services Expected Discharge Plan:  (inpatient psych) In-house Referral: Clinical Social Work Discharge Planning Services: CM Consult   Living arrangements for the past 2 months: [place] Determinants of Health (SDOH) Interventions    Readmission Risk Interventions    07/27/2020   11:06 AM  Readmission Risk Prevention Plan  Transportation Screening Complete  PCP or Specialist Appt within 5-7 Days Complete  Home Care Screening Complete

## 2022-01-31 NOTE — Progress Notes (Addendum)
Thurnell Lose, MD Physician Hospitalist H&P     Signed Date of Service:  01/28/2022  9:57 AM   Signed      Expand All Collapse All                                                                                                                                                                                                                                                                                                                                                                                                                                                                                                                  PROGRESS NOTE  Patient Demographics:     [name], is a 56 y.o. female, DOB - 14-Jun-1966, YQM:[id number]   Outpatient Primary MD for the patient is [name], MD    LOS - 68  Admit date - 01/12/2022        Chief Complaint  Patient presents with   Drug Overdose   Suicide Attempt        Brief Narrative (HPI from H&P)   56 yo F PMH schizoaffective disorder (bipolar type), Depression, prior SI, polysubstance use disorder, COPD, HTN, DM presented to the ED following suicide attempt. Patient had been drinking, while with friends stated that she was going to kill herself and was witnessed ingesting a bottle of promethazine and alcohol overdose.   In transport with EMS became more somnolent. Arrived to ED with GCS 3 and was intubated in this setting admitted by pulmonary critical care, debated but had problems with aspiration pneumonia, underwent tracheostomy placement on 01/19/2022.  Transition to trach collar on  01/20/2022.  She currently has issues with resolving ileus, intermittent urinary retention, recently treated UTI, intermittent fluid overload.  Still on trach collar but started on oral diet by speech therapy.     Current events and procedures 7/23 ED after etoh consumption and intentional promethazine overdose. Intubated. Admit to ICU  7/24 Tachycardic to 130s overnight, also with episode of whole body stiffening, ?dystonia vs. seizure. EEG with irritability, LTM initiated. Neuro consulted. 7/25 Extubated, intermittently somnolent/agitated, not managing secretions well. O2 sats stable on [place]. Cortrak difficult to flush. 7/26 Reintubated early AM for hypoxia, airway protection. Unasyn initiated for ?aspiration PNA. LTM EEG discontinued. MRI pending. 7/27 MRI no acute changes. Wakes up follows commands on .7 dex  7/28 off NE  01/19/2022>> Trach after goals of care discussion with daughter 7/31 Transitioned to ATC.  8/2 CT abdomen: Stool in right colon.  Normal appendix.  Placed gastric bypass surgery.  Nasogastric tube in stomach.  No evidence of wall thickening or obstruction and bowel.  Questionable cystitis.  Bibasilar pleural effusions.  Ceftriaxone started. 01/28/2022.  Transferred to hospitalist service on day 16 of her hospital stay    Subjective:     [name] today has, No headache, No chest pain, no shortness of breath or cough, some abdominal discomfort, no focal weakness.    Assessment  & Plan :    Alcohol and promethazine overdose with likely attempted suicide with history of schizoaffective disorder bipolar type, history of polysubstance abuse.  Seen by psych, currently stable continue to monitor.  For now on supportive care.  On as needed Ativan and Haldol   ?? History of seizure disorder.  Currently on as needed Ativan.  EEG here nonacute.  Seen by neurology initially was on Keppra this was discontinued by neurology on 01/19/2022.   Colonic dysmotility with obstipation and some  intermittent urinary retention.  On bowel regimen with bowel movements now, abdominal x-ray nonacute, some suprapubic fullness, checking bladder scans every shift for any signs of urinary retention.   Microcytic anemia.  Check anemia panel, type screen.   Inability to protect airway with subsequent acute hypoxic respiratory failure.  Required intubation and complicated by aspiration pneumonia-  all due to initial drug overdose and toxic encephalopathy.  Now has been trached, currently on trach collar, speech following, on dysphagia 2 diet along with IV TNA.  Currently not on any antibiotics.   Moderate protein calorie malnutrition.  On oral diet and IV TNA via right arm PICC line.   History  of tobacco abuse and COPD.  No acute issues counseled to quit tobacco and alcohol.   GERD.  On PPI.     DM type II.  On long-acting insulin along with sliding scale.   Recent Labs       Lab Results  Component Value Date    HGBA1C 7.4 (H) 11/14/2021      CBG (last 3)  Recent Labs (last 2 labs)        Recent Labs    01/28/22 0337 01/28/22 0715 01/28/22 0848  GLUCAP 91 175* 268*               Condition - Extremely Guarded   Family Communication  : None present   Code Status : Full code   Consults  : PCCM, neurology, psychiatry   PUD Prophylaxis : PPI          Disposition Plan  :     Status is: Inpatient DVT Prophylaxis  :     heparin injection 5,000 Units Start: 01/21/22 1400 Place and maintain sequential compression device Start: 01/18/22 0754 SCDs Start: 01/12/22 0900     Recent Labs       Lab Results  Component Value Date    PLT 594 (H) 01/28/2022        Diet :  Diet Order                  DIET DYS 2 Room service appropriate? Yes with Assist; Fluid consistency: Nectar Thick  Diet effective now                         Inpatient Medications   Scheduled Meds:  vitamin C  500 mg Oral Daily   bisacodyl  10 mg Rectal BID   Chlorhexidine Gluconate Cloth   6 each Topical Daily   cyanocobalamin  1,000 mcg Intramuscular Q7 days   docusate sodium  100 mg Oral BID   feeding supplement  237 mL Oral BID BM   furosemide  40 mg Oral Daily   guaiFENesin  600 mg Oral BID   haloperidol lactate  2 mg Intravenous BID    And   LORazepam  1 mg Intravenous BID   heparin injection (subcutaneous)  5,000 Units Subcutaneous Q8H   insulin aspart  0-15 Units Subcutaneous Q4H   insulin glargine-yfgn  22 Units Subcutaneous Daily   lactulose  30 g Oral TID   linaclotide  145 mcg Oral QAC breakfast   metoCLOPramide (REGLAN) injection  5 mg Intravenous Q6H   metoprolol tartrate  25 mg Oral BID   mouth rinse  15 mL Mouth Rinse 4 times per day   pantoprazole  40 mg Oral QHS   revefenacin  175 mcg Nebulization Daily   simethicone  40 mg Oral QID   sodium chloride flush  10-40 mL Intracatheter Q12H   vitamin A  50,000 Units Intramuscular Daily    Followed by   Derrill Memo ON 02/11/2022] vitamin A  10,000 Units Oral Daily   Vitamin D (Ergocalciferol)  50,000 Units Oral Q7 days    Continuous Infusions:  sodium chloride 10 mL/hr at 01/28/22 0800   TPN ADULT (ION) 70 mL/hr at 01/28/22 0800    PRN Meds:.acetaminophen, albuterol, food thickener, ondansetron (ZOFRAN) IV, mouth rinse, polyethylene glycol, sodium chloride flush   Antibiotics  :     Anti-infectives (From admission, onward)        Start     [employer]  Frequency Ordered Stop    01/22/22 1930   cefTRIAXone (ROCEPHIN) 1 g in sodium chloride 0.9 % 100 mL IVPB        1 g 200 mL/hr over 30 Minutes Intravenous Every 24 hours 01/22/22 1839 01/26/22 1957    01/15/22 0830   Ampicillin-Sulbactam (UNASYN) 3 g in sodium chloride 0.9 % 100 mL IVPB        3 g 200 mL/hr over 30 Minutes Intravenous Every 6 hours 01/15/22 0742 01/19/22 2054              Time Spent in minutes  30     Lala Lund M.D on 01/28/2022 at 9:58 AM   To page go to [email or web address]    [facility] -  Office  [phone]    See all Orders from today for further details      Objective:          Vitals:    01/28/22 0600 01/28/22 0700 01/28/22 0800 01/28/22 0826  BP: 126/70 111/79 129/75    Pulse: (!) 104 95 (!) 101 (!) 103  Resp: (!) 39 (!) 27 (!) 31 (!) 32  Temp:   98.4 F (36.9 C) 98.4 F (36.9 C)    TempSrc:   Axillary Axillary    SpO2: 93% 93% 93% 93%  Weight:          Height:                 Wt Readings from Last 3 Encounters:  01/28/22 42.9 kg  11/15/21 42.2 kg  09/05/21 59 kg        Intake/Output Summary (Last 24 hours) at 01/28/2022 0958 Last data filed at 01/28/2022 7341    Gross per 24 hour  Intake 2644.9 ml  Output 900 ml  Net 1744.9 ml        Physical Exam   Awake Alert, No new F.N deficits, Normal affect [place].AT,PERRAL, trach collar in place, right arm PICC line in place Supple Neck, No JVD,   Symmetrical Chest wall movement, Good air movement bilaterally, CTAB RRR,No Gallops,Rubs or new Murmurs,  +ve B.Sounds, Abd Soft, mild suprapubic fullness and tenderness No Cyanosis, Clubbing or edema       Data Review:      CBC Last Labs        Recent Labs  Lab 01/22/22 0357 01/24/22 1105 01/28/22 0635  WBC 15.5* 14.5* 14.2*  HGB 7.7* 7.5* 7.3*  HCT 26.5* 26.1* 25.1*  PLT 322 296 594*  MCV 69.9* 73.3* 70.3*  MCH 20.3* 21.1* 20.4*  MCHC 29.1* 28.7* 29.1*  RDW 27.0* 29.6* 29.0*  LYMPHSABS  --  1.1  --   MONOABS  --  0.7  --   EOSABS  --  0.1  --   BASOSABS  --  0.0  --         Electrolytes Last Labs             Recent Labs  Lab 01/23/22 0404 01/23/22 9379 01/24/22 0503 01/24/22 1449 01/25/22 0413 01/26/22 0416 01/27/22 0430 01/28/22 0635  NA 138 138 136  --  133* 131* 132* 133*  K 5.2* 4.2 3.7  --  4.1 4.2 4.1 4.2  CL 106 104 100  --  98 93* 93* 98  CO2 '27 27 29  '$ --  '30 28 30 28  '$ GLUCOSE 394* 276* 211*  --  179* 251* 268* 186*  BUN '10 9 10  '$ --  '11 10 10 '$ 17  CREATININE 0.30* 0.38* <0.30*  --  0.44 0.32* 0.38* 0.32*  CALCIUM 8.1* 8.0* 8.4*  --  8.0*  7.8* 8.4* 8.3*  AST 58* 51*  --   --   --   --  56*  --   ALT 59* 63*  --   --   --   --  87*  --   ALKPHOS 59 58  --   --   --   --  89  --   BILITOT 0.3 0.4  --   --   --   --  0.2*  --   ALBUMIN 1.6* 1.6*  --   --   --   --  1.8*  --   MG 2.1  --  1.9  --  2.0 2.0 1.8 2.0  LATICACIDVEN  --   --   --  2.8*  --   --   --   --   BNP  --   --   --   --   --   --   --  20.6        ------------------------------------------------------------------------------------------------------------------ Recent Labs (last 2 labs)      Recent Labs    01/27/22 0430  TRIG 39      ID Labs Last Labs              Recent Labs  Lab 01/22/22 0357 01/23/22 0404 01/24/22 0503 01/24/22 1105 01/24/22 1449 01/25/22 0413 01/26/22 0416 01/27/22 0430 01/28/22 0635  WBC 15.5*  --   --  14.5*  --   --   --   --  14.2*  PLT 322  --   --  296  --   --   --   --  594*  LATICACIDVEN  --   --   --   --  2.8*  --   --   --   --   CREATININE 0.33*   < > <0.30*  --   --  0.44 0.32* 0.38* 0.32*   < > = values in this interval not displayed.          Radiology Reports DG Chest Port 1 View   Result Date: 01/28/2022 CLINICAL DATA:  Shortness of breath, check tracheostomy cannula. EXAM: PORTABLE CHEST 1 VIEW COMPARISON:  Portable chest 01/21/2022 FINDINGS: Right PICC again terminates at about the superior cavoatrial junction; tracheostomy cannula tip is 4.6 cm from the carina. Interval removal of feeding tube. There is stable cardiomegaly and mild central vascular and interstitial prominence consistent with mild interstitial edema. There is streaky atelectasis or infiltrates in both infrahilar areas. Please correlate clinically. The remaining lungs are generally clear. Today the pleural effusions do appear smaller than previously but in all other respects there are no further changes. IMPRESSION: 1. Persistent mild central vascular and interstitial prominence. 2. Small pleural effusions appear smaller than  previously, with persistent bilateral infrahilar streaky atelectasis or pneumonia. 3. Support devices as above. Electronically Signed   By: Telford Nab M.D.   On: 01/28/2022 07:12    DG Abd Portable 1V   Result Date: 01/27/2022 CLINICAL DATA:  Ileus. EXAM: PORTABLE ABDOMEN - 1 VIEW COMPARISON:  January 20, 2022. FINDINGS: Feeding tube has been removed. Residual contrast is noted in the colon. No abnormal bowel dilatation is noted. IMPRESSION: No abnormal bowel dilatation. Electronically Signed   By: Marijo Conception M.D.   On: 01/27/2022 09:19    DG Swallowing Func-Speech Pathology   Result  Date: 01/24/2022 Table formatting from the original result was not included. Objective Swallowing Evaluation: Type of Study: MBS-Modified Barium Swallow Study  Patient Details Name: [name] MRN: [id number] Date of Birth: Dec 20, 1965 Today's Date: 01/24/2022 Time: SLP Start Time (ACUTE ONLY): 31 -SLP Stop Time (ACUTE ONLY): 4098 SLP Time Calculation (min) (ACUTE ONLY): 20 min Past Medical History: Past Medical History: Diagnosis Date  Abdominal pain   Accidental drug overdose April 2013  Anxiety   Atrial fibrillation ([place]) 09/29/11  converted spontaneously  Chronic back pain   Chronic knee pain   Chronic nausea   Chronic pain   COPD (chronic obstructive pulmonary disease) ([place])   Depression   Diabetes mellitus   states her doctor took her off all DM meds in past month  Diabetic neuropathy ([place])   Dyspnea   with exertion   GERD (gastroesophageal reflux disease)   Headache(784.0)   migraines   HTN (hypertension)   not on meds since in a year   Hyperlipidemia   Hypothyroidism   not on meds in a while   Mental disorder   Bipolar and schizophrenic  Requires supplemental oxygen   as needed per patient   Schizophrenia ([place])   Schizophrenia, acute ([place]) 11/13/2017  Tobacco abuse  Past Surgical History: Past Surgical History: Procedure Laterality Date  ABDOMINAL HYSTERECTOMY    BLADDER SUSPENSION  03/04/2011  Procedure: [facility]  PROCEDURE;  Surgeon: Elayne Snare MacDiarmid;  Location: [facility];  Service: Urology;  Laterality: N/A;  CYSTOCELE REPAIR  03/04/2011  Procedure: ANTERIOR REPAIR (CYSTOCELE);  Surgeon: Reece Packer;  Location: [facility];  Service: Urology;  Laterality: N/A;  CYSTOSCOPY  03/04/2011  Procedure: CYSTOSCOPY;  Surgeon: Elayne Snare MacDiarmid;  Location: [facility];  Service: Urology;  Laterality: N/A;  ESOPHAGOGASTRODUODENOSCOPY (EGD) WITH PROPOFOL N/A 05/12/2017  Procedure: ESOPHAGOGASTRODUODENOSCOPY (EGD) WITH PROPOFOL;  Surgeon: Alphonsa Overall, MD;  Location: [facility];  Service: General;  Laterality: N/A;  GASTRIC ROUX-EN-Y N/A 03/25/2016  Procedure: LAPAROSCOPIC ROUX-EN-Y GASTRIC BYPASS WITH UPPER ENDOSCOPY;  Surgeon: Excell Seltzer, MD;  Location: [facility];  Service: General;  Laterality: N/A;  KNEE SURGERY    LAPAROSCOPIC ASSISTED VAGINAL HYSTERECTOMY  03/04/2011  Procedure: LAPAROSCOPIC ASSISTED VAGINAL HYSTERECTOMY;  Surgeon: Cyril Mourning, MD;  Location: [facility];  Service: Gynecology;  Laterality: N/A; HPI: Pt is a 56 y.o. female admitted 01/12/22 after suicide attempt with intentional drug overdose of promethazine. ETT 7/23-7/25; reintubated for hypoxia 7/26- trach 7/30. ATC initiated on 7/31. EEG 7/31: moderate severe diffuse encephalopathy, nonspecific etiology; no seizures. Cortrak placed 7/31. PMH: Afib, polysubstance abuse, COPD, DM, neuropathy, HTN, bipolar disorder, schizophrenia, anxiety, depression, ETOH abuse.  No data recorded  Recommendations for follow up therapy are one component of a multi-disciplinary discharge planning process, led by the attending physician.  Recommendations may be updated based on patient status, additional functional criteria and insurance authorization. Assessment / Plan / Recommendation   01/24/2022   3:49 PM Clinical Impressions Clinical Impression The was study was completed with PMSV on and subsequently off. Pt presents with oropharyngeal dysphagia characterized by reduced bolus  cohesion, reduced anterior laryngeal movement, and a pharyngeal delay. She demonstrated mildly prolonged mastiation, and premature spillage to the valleculae. Penetration (PAS 3) was noted with large boluses of thin liquids via straw when the PMSV was donned. Laryngeal invasion of thin liquids progressed to aspiration (PAS 7) with the PMSV doffed. A dysphagia 2 diet with nectar thick liquids will be initiated at this time with observance of swallowing precautions including use  of the PMSV during meals with full supervision. SLP will continue to follow pt and prognosis of advancement is judged to be good. SLP Visit Diagnosis Dysphagia, oropharyngeal phase (R13.12) Impact on safety and function Moderate aspiration risk     01/24/2022   3:49 PM Treatment Recommendations Treatment Recommendations Therapy as outlined in treatment plan below     01/24/2022   3:49 PM Prognosis Prognosis for Safe Diet Advancement Good   01/24/2022   3:49 PM Diet Recommendations SLP Diet Recommendations Dysphagia 2 (Fine chop) solids;Nectar thick liquid Liquid Administration via Cup;Straw Medication Administration Whole meds with puree Compensations Slow rate;Small sips/bites Postural Changes Seated upright at 90 degrees     01/24/2022   3:49 PM Other Recommendations Follow Up Recommendations Skilled nursing-short term rehab (<3 hours/day) Assistance recommended at discharge Frequent or constant Supervision/Assistance Functional Status Assessment Patient has had a recent decline in their functional status and demonstrates the ability to make significant improvements in function in a reasonable and predictable amount of time.   01/24/2022   3:49 PM Frequency and Duration  Speech Therapy Frequency (ACUTE ONLY) min 2x/week Treatment Duration 2 weeks     01/24/2022   3:49 PM Oral Phase Oral Phase Impaired Oral - Nectar Cup WFL Oral - Nectar Straw WFL Oral - Thin Cup Decreased bolus cohesion;Premature spillage Oral - Thin Straw Decreased bolus  cohesion;Premature spillage Oral - Puree WFL Oral - Regular Impaired mastication    01/24/2022   3:49 PM Pharyngeal Phase Pharyngeal Phase Impaired Pharyngeal- Nectar Cup Reduced anterior laryngeal mobility;Delayed swallow initiation-vallecula Pharyngeal- Nectar Straw Reduced anterior laryngeal mobility;Delayed swallow initiation-vallecula Pharyngeal- Thin Cup Reduced anterior laryngeal mobility;Delayed swallow initiation-vallecula;Penetration/Aspiration during swallow Pharyngeal Material enters airway, remains ABOVE vocal cords and not ejected out;Material enters airway, passes BELOW cords and not ejected out despite cough attempt by patient Pharyngeal- Thin Straw Reduced anterior laryngeal mobility;Delayed swallow initiation-vallecula;Penetration/Aspiration during swallow;Trace aspiration Pharyngeal Material enters airway, remains ABOVE vocal cords and not ejected out;Material enters airway, passes BELOW cords and not ejected out despite cough attempt by patient Pharyngeal- Puree Reduced anterior laryngeal mobility;Delayed swallow initiation-vallecula Pharyngeal- Regular Reduced anterior laryngeal mobility Pharyngeal- Pill Reduced anterior laryngeal mobility;Delayed swallow initiation-vallecula    01/24/2022   3:49 PM Cervical Esophageal Phase  Cervical Esophageal Phase [facility] [name], [place], [place] Office number [phone] Horton Marshall 01/24/2022, 3:58 PM                                  Routing History             Note Details  Author Thurnell Lose, MD File Time 01/28/2022 10:13 AM  Author Type Physician Status Signed  Last Editor Thurnell Lose, [place] # [id number] [place] Date 01/12/2022

## 2022-01-31 NOTE — Progress Notes (Signed)
Nutrition Follow-up  DOCUMENTATION CODES:   Underweight, Severe malnutrition in context of chronic illness  INTERVENTION:   TPN to meet 100% of nutritional needs  Consider Cortrak placement once colon decompressed but continue TPN until then. Discussed possibility of long term feeding access with Attending MD as well; pt would require surgical feeding tube placement due to hx of gastric bypass  Continue oral diet as tolerated  Magic cup TID with meals, each supplement provides 290 kcal and 9 grams of protein  Ensure Enlive po BID, each supplement provides 350 kcal and 20 grams of protein.  Continue B-12, Vitamin C, Vitamin A, Vitamin D secondary to deficiencies   NUTRITION DIAGNOSIS:   Severe Malnutrition related to chronic illness (hx gastric bypass, COPD, polysubtance abuse) as evidenced by severe fat depletion, severe muscle depletion.  Being addressed  GOAL:   Patient will meet greater than or equal to 90% of their needs  Met via TPN  MONITOR:   TF tolerance, Vent status, Labs, Weight trends  REASON FOR ASSESSMENT:   Consult, Ventilator Enteral/tube feeding initiation and management  ASSESSMENT:   56 yo female admitted with acute encephalopathy in setting of intentional OD with SI, intubated for airway protection. PMH includes COPD, HTN, DM, schizoaffective disorder, depression. Pt with hx of gastric bypass in 2017, sig weight loss   7/23 Admitted, Intubated, TF started via Protocol 7/25 Extubated, TF held, Cortrak placed, kinked and could not use, re-intubated, TF not started 7/26 Cortrak TF adjusted by RD, flushed, TF remains on hold 7/27 CT abdomen: Large amount of stool in the ascending colon.Moderate amount of stool in the descending colon. Gaseous distension of the transverse colon. Mild distension of the small bowel. 7/28 TPN consul placed 7/29 TPN initiated 7/30 Trach placed 7/31 Cortrak placed 8/02 CT abdomen/pelvis without acute intraabdominal  process per report.  8/03 Trickle TF ordered by [facility] 8/04 Cortrak removed by patient. Diet advanced to Dysphagia 2, Nectar Thick post MBS   Pt tolerating TC. Remains IVC with 1:1 sitter. Alert on my visit today but appaers uncomfortable. Mother at bedside. Noted later in the day, pt more lethargic and drowsy per Psych.   Continues on TPN at goal rate of 70 ml/hr providing 1607 kcals, 86 g of protein.  Pt eating minimally. Pt ate part of a Magic Cup for Breakfast this AM and drank 1 nectar thick juice at lunch but did not eat anything. Tray ticket from dinner meal last night in folder but pt ate 10% of chicken and drank juice. Pt is not eating enough nutrition by mouth to justify discontinuing TPN at this time. Discussed with Dr. Reesa Chew. RD reviewed GI recommendations from yesterday and today. Recommend continuing TPN for now, monitor colonic distention. Once colon decompressed, can consider transitioning to Cortrak with EN. Ultimately, pt would likely benefit from long term EN access.   Noted rectal tube in place  Labs: reviewed Meds: ferric gluconate, B12, Vitamin A, Viatmin C, colce, dulcolax, lasix, ss novolog, lactulose, reglan, simethicone  Diet Order:   Diet Order             DIET DYS 2 Room service appropriate? Yes with Assist; Fluid consistency: Nectar Thick  Diet effective now                   EDUCATION NEEDS:   Not appropriate for education at this time  Skin:  Skin Assessment: Reviewed RN Assessment  Last BM:  8/4  Height:   Ht Readings from Last  1 Encounters:  01/12/22 5' (1.524 m)    Weight:   Wt Readings from Last 1 Encounters:  01/31/22 43.3 kg    Ideal Body Weight:     BMI:  Body mass index is 18.63 kg/m.  Estimated Nutritional Needs:   Kcal:  1600-1800 kcals  Protein:  100-115 g  Fluid:  >/= 1.6 L   Kerman Passey MS, RDN, LDN, CNSC Registered Dietitian 3 Clinical Nutrition RD Pager and On-Call Pager Number Located in [place]

## 2022-01-31 NOTE — Progress Notes (Signed)
Subjective: Nursing reported a bowel movement when she pulled out the [facility].  Also passage of flatus with standing.  Objective: Vital signs in last 24 hours: Temp:  [98.4 F (36.9 C)-100.1 F (37.8 C)] 99.1 F (37.3 C) (08/11 0314) Pulse Rate:  [84-106] 106 (08/11 0329) Resp:  [2-37] 37 (08/11 0329) BP: (105-136)/(56-75) 128/74 (08/11 0314) SpO2:  [89 %-98 %] 89 % (08/11 0329) FiO2 (%):  [35 %-40 %] 40 % (08/11 0329) Weight:  [43.3 kg] 43.3 kg (08/11 0314) Last BM Date : 01/29/22  Intake/Output from previous day: 08/10 0701 - 08/11 0700 In: 2750.7 [P.O.:1440; I.V.:1310.7] Out: 2345 [Urine:2300; Stool:45] Intake/Output this shift: No intake/output data recorded.  General appearance: sleepy GI: soft, tympanic, subjectively less distended  Lab Results: Recent Labs    01/31/22 0437  WBC 20.5*  HGB 7.0*  HCT 23.1*  PLT 455*   BMET Recent Labs    01/29/22 0430 01/30/22 0624  NA 136 134*  K 4.4 4.9  CL 101 102  CO2 29 26  GLUCOSE 88 106*  BUN 15 19  CREATININE 0.37* 0.48  CALCIUM 8.4* 8.3*   LFT Recent Labs    01/30/22 0624  PROT 7.4  ALBUMIN 1.6*  AST 56*  ALT 82*  ALKPHOS 137*  BILITOT 0.2*   PT/INR No results for input(s): "LABPROT", "INR" in the last 72 hours. Hepatitis Panel No results for input(s): "HEPBSAG", "HCVAB", "HEPAIGM", "HEPBIGM" in the last 72 hours. C-Diff No results for input(s): "CDIFFTOX" in the last 72 hours. Fecal Lactopherrin No results for input(s): "FECLLACTOFRN" in the last 72 hours.  Studies/Results: US Abdomen Limited RUQ (LIVER/GB)  Result Date: 01/30/2022 CLINICAL DATA:  Cholelithiasis. EXAM: ULTRASOUND ABDOMEN LIMITED RIGHT UPPER QUADRANT COMPARISON:  Right upper quadrant abdominal ultrasound 05/06/2017, CT abdomen and pelvis without contrast 01/30/2022 FINDINGS: The study is technically limited due to overlying bowel-gas. Gallbladder: The gallbladder wall thickness measures 4 mm diffusely, mildly thickened. No  gallstone is seen. Negative sonographic Murphy's sign. No pericholecystic fluid. Common bile duct: Diameter: 5 mm, within normal limits. Liver: No focal lesion identified. Limited visualization of the left hepatic lobe due to overlying bowel gas. Within normal limits in parenchymal echogenicity. Portal vein is patent on color Doppler imaging with normal direction of blood flow towards the liver. Other: None. IMPRESSION: There is mild diffuse gallbladder wall thickening, however otherwise there is no sonographic evidence of acute cholecystitis. No gallstones. Gallbladder wall thickening is nonspecific and can be seen secondary to increased systemic fluid status and other etiologies including hypoalbuminemia, hepatitis, and chronic cholecystitis. Electronically Signed   By: Yvonne Kendall M.D.   On: 01/30/2022 13:58   CT ABDOMEN PELVIS WO CONTRAST  Result Date: 01/30/2022 CLINICAL DATA:  LEFT LOWER QUADRANT abdominal pain. Bowel obstruction suspected. EXAM: CT ABDOMEN AND PELVIS WITHOUT CONTRAST TECHNIQUE: Multidetector CT imaging of the abdomen and pelvis was performed following the standard protocol without IV contrast. RADIATION DOSE REDUCTION: This exam was performed according to the departmental dose-optimization program which includes automated exposure control, adjustment of the mA and/or kV according to patient size and/or use of iterative reconstruction technique. COMPARISON:  01/29/2022, 01/22/2022 FINDINGS: Lower chest: Interval resolution of bilateral pleural effusions. There is minimal residual consolidation in the RIGHT LOWER lobe and minimal subsegmental atelectasis in the RIGHT LOWER lobe. Heart size is normal. No pericardial effusion. Hepatobiliary: Noncontrast appearance of the liver is unremarkable. Gallbladder is partially obscured by contrast in the colon. However, gallbladder wall is possibly thickened, measuring up to 8 millimeters.  Suspect radiopaque stone within the gallbladder.  Pancreas:Partially obscured by contrast material. No abnormality identified. Spleen: Normal in size without focal abnormality. Adrenals/Urinary Tract: Adrenal glands are unremarkable. Kidneys are normal, without renal calculi, focal lesion, or hydronephrosis. Interval resolution of bilateral hydronephrosis. Bladder is distended and normal in appearance. There has been resolution of bladder wall thickening since prior study. Stomach/Bowel: Status post gastric bypass surgery without evidence for obstruction. Visualized small bowel loops are unremarkable. Small bowel anastomosis in the LEFT LOWER QUADRANT. There is residual contrast in the colon, with dilatation of the cecum, measuring up to 8.4 centimeters. No colonic mass. Vascular/Lymphatic: There is atherosclerotic calcification of the abdominal aorta. Limited evaluation of vascular structures due to lack of intravenous contrast. Reproductive: Prior hysterectomy.  No adnexal mass. Other: No ascites. Body wall edema. Subcutaneous gas in the RIGHT flank, consistent with prior injection. Musculoskeletal: Remote wedge compression fracture of T9. No suspicious lytic or blastic lesions. IMPRESSION: 1. Improvement in bilateral hydronephrosis and bladder wall thickening. 2. Postoperative changes after gastric bypass surgery. 3. Improvement in RIGHT LOWER lobe consolidation and interval resolution of bilateral pleural effusions. 4. Suspect gallbladder wall thickening. Suspect radiopaque calculus. Consider further evaluation with RIGHT UPPER QUADRANT ultrasound. 5. Dilatation and retained contrast in the colon, without evidence for large bowel obstruction. 6.  Aortic atherosclerosis.  (ICD10-I70.0) 7. Hysterectomy. 8. Anasarca. 9. Remote wedge compression fracture T9. Electronically Signed   By: Nolon Nations M.D.   On: 01/30/2022 12:01   DG Abd 1 View  Result Date: 01/29/2022 CLINICAL DATA:  Abdominal pain EXAM: ABDOMEN - 1 VIEW COMPARISON:  Previous studies including  the examination done earlier today FINDINGS: Bowel gas pattern is nonspecific. There is moderate distention of ascending and transverse colon. Oral contrast seen in the small bowel loops has moved into the colon. There is interval emptying of bowel contents in the left colon since the previous study. IMPRESSION: There is no small bowel dilation. There is moderate distention of right colon, possibly suggesting ileus. There is interval passage of most of the small bowel contrast into the colon and emptying of contrast from the left colon since the study done earlier today. Electronically Signed   By: Elmer Picker M.D.   On: 01/29/2022 21:08   DG Abd Portable 1V  Result Date: 01/29/2022 CLINICAL DATA:  Abdominal distension beginning today. EXAM: PORTABLE ABDOMEN - 1 VIEW COMPARISON:  01/27/2022 FINDINGS: Contrast remains present within small and large bowel, likely indicating poor gastrointestinal motility and or constipation. No true obstructive pattern. IMPRESSION: Previously ingested contrast remains within small bowel and large bowel, likely indicating poor gastrointestinal motility and or constipation. No sign of frank bowel obstruction. Electronically Signed   By: Nelson Chimes M.D.   On: 01/29/2022 10:50    Medications: Scheduled:  vitamin C  500 mg Oral Daily   bisacodyl  10 mg Rectal BID   Chlorhexidine Gluconate Cloth  6 each Topical Daily   cyanocobalamin  1,000 mcg Intramuscular Q7 days   docusate sodium  100 mg Oral BID   feeding supplement  237 mL Oral TID WC & HS   furosemide  40 mg Oral Daily   guaiFENesin  600 mg Oral BID   haloperidol lactate  2 mg Intravenous BID   And   LORazepam  1 mg Intravenous BID   heparin injection (subcutaneous)  5,000 Units Subcutaneous Q8H   insulin aspart  0-6 Units Subcutaneous Q4H   lactulose  30 g Oral TID   linaclotide  145  mcg Oral QAC breakfast   metoCLOPramide (REGLAN) injection  5 mg Intravenous Q6H   metoprolol tartrate  25 mg Oral BID    mouth rinse  15 mL Mouth Rinse 4 times per day   pantoprazole  40 mg Oral QHS   revefenacin  175 mcg Nebulization Daily   simethicone  40 mg Oral TID AC & HS   sodium chloride flush  10-40 mL Intracatheter Q12H   vitamin A  50,000 Units Intramuscular Daily   Followed by   Derrill Memo ON 02/11/2022] vitamin A  10,000 Units Oral Daily   Vitamin D (Ergocalciferol)  50,000 Units Oral Q7 days   Continuous:  sodium chloride Stopped (01/28/22 1141)   ferric gluconate (FERRLECIT) IVPB 250 mg (01/30/22 0958)   TPN ADULT (ION) 70 mL/hr at 01/30/22 1900    Assessment/Plan: 1) Colonic ileus. 2) Abdominal pain.   There was no elicitation of pain with palpation this AM.  Subjectively, there does appear to be a mild improvement in her distension.  She does pass flatus with standing.  Plan: 1) Continue with Flexiseal for another 1-2 days. 2) Continue with ambulation daily, if possible. 3) Continue with lateral rotations q2 hours.  LOS: 19 days   Eulon Allnutt D 01/31/2022, 7:16 AM

## 2022-01-31 NOTE — Progress Notes (Signed)
PROGRESS NOTE    [name]  HYW:[id number] DOB: 01-Nov-1965 DOA: 01/12/2022 PCP: [name] Ebbs, MD   Brief Narrative:  56 year old with schizoaffective disorder, depression, polysubstance abuse, COPD, HTN, DM2 for suicide attempt and overdosing on promethazine and alcohol.  Initially patient was intubated due to GCS of 3 complicated by aspiration pneumonia.  Eventually when trach placement on 7/30 and transition to trach collar 7/31.  Other complicated issues that she developed ileus, urinary retention with intermittent fluid overload.  Ongoing trach management by PCCM, GI consulted for colonic ileus.  Current events and procedures 7/23 ED after etoh consumption and intentional promethazine overdose. Intubated. Admit to ICU  7/24 Tachycardic to 130s overnight, also with episode of whole body stiffening, ?dystonia vs. seizure. EEG with irritability, LTM initiated. Neuro consulted. 7/25 Extubated, intermittently somnolent/agitated, not managing secretions well. O2 sats stable on [place]. Cortrak difficult to flush. 7/26 Reintubated early AM for hypoxia, airway protection. Unasyn initiated for ?aspiration PNA. LTM EEG discontinued. MRI pending. 7/27 MRI no acute changes. Wakes up follows commands on .7 dex  7/28 off NE  01/19/2022>> Trach after goals of care discussion with daughter 7/31 Transitioned to ATC.  8/2 CT abdomen: Stool in right colon.  Normal appendix.  Placed gastric bypass surgery.  Nasogastric tube in stomach.  No evidence of wall thickening or obstruction and bowel.  Questionable cystitis.  Bibasilar pleural effusions.  Ceftriaxone started. 01/28/2022.  Transferred to hospitalist service on day 16 of her hospital stay  Assessment & Plan:  Principal Problem:   Intentional overdose ([place]) Active Problems:   Hypokalemia   Protein-calorie malnutrition, severe   Ileus ([place])     Obstipation with colonic dysmotility/ileus Acute urinary retention - Despite aggressive bowel regimen,  Reglan, smog enema patient is still having significant abdominal pain.  Abdominal x-ray shows contrast but otherwise unrevealing. Dr [name] following, Rectal Tube In place.   SIRS - Unknown source.  UA negative,  Blood Cx = NGTD. Sputum Cx GPC, GPR and GNR. With rising WBC, will empirically start Cefepime Flagyl.    Alcohol and promethazine overdose with likely attempted suicide with history of schizoaffective disorder bipolar type, history of polysubstance abuse.   Patient has been seen by psychiatry-currently on Haldol and Ativan.  Plans to add antidepressant later this week.  Patient is IVC. Needs inpatient psych when medically cleared.    ?? History of seizure disorder.   No clear indication to keep her on Keppra per Neuro    Microcytic anemia. -Low iron saturation, slightly low ferritin 56.  Planned iron X4 doses.  For now we will avoid p.o. due to issues with constipation.   Acute hypoxic respiratory failure secondary to aspiration Status post tracheostomy Tracheostomy placed due to recurrent intubation.  Has 6 mm cuffed [place].  Routine trach care.  Will give additional dose of Lasix '40mg'$  IV today.    Moderate protein calorie malnutrition.  -Dysphagia 2 diet and TPN.  RU E PICC line in place 7/28. Will attempted    History of tobacco abuse and COPD.   -As needed bronchodilators No acute issues counseled to quit tobacco and alcohol.   GERD.   -PPI nightly.  Simethicone.   DM type II -On sliding scale and Accu-Cheks     DVT prophylaxis: Subcu heparin Code Status: Full  Family Communication:  Family at bedside.   Status is: Inpatient Remains inpatient appropriate because: On going eval for fevers and abd pain   Nutritional status    Signs/Symptoms: severe fat depletion,  severe muscle depletion  Interventions: Refer to RD note for recommendations, Ensure Enlive (each supplement provides 350kcal and 20 grams of protein), MVI  Body mass index is 18.63 kg/m.          Subjective: Overall feels weak. Passed some gas but abd still distended a bit.    Examination: Constitutional: Appears chronically ill Respiratory: b/l rhonchi Cardiovascular: Normal sinus rhythm, no rubs Abdomen: Ab remain somewhat distended.  Tender to palpation. Musculoskeletal: No edema noted Skin: No rashes seen Neurologic: CN 2-12 grossly intact.  And nonfocal Psychiatric: Normal judgment and insight. Alert and oriented x 3. Normal mood.    Tracheostomy noted PICC line noted   Objective: Vitals:   01/31/22 0314 01/31/22 0329 01/31/22 0726 01/31/22 0736  BP: 128/74     Pulse: (!) 104 (!) 106  (!) 113  Resp: (!) 2 (!) 37  (!) 30  Temp: 99.1 F (37.3 C)     TempSrc: Oral     SpO2: 93% (!) 89% 92% 96%  Weight: 43.3 kg     Height:        Intake/Output Summary (Last 24 hours) at 01/31/2022 0745 Last data filed at 01/31/2022 0525 Gross per 24 hour  Intake 2510.73 ml  Output 2345 ml  Net 165.73 ml   Filed Weights   01/29/22 0500 01/30/22 0330 01/31/22 0314  Weight: 43 kg 43.2 kg 43.3 kg     Data Reviewed:   CBC: Recent Labs  Lab 01/24/22 1105 01/28/22 0635 01/31/22 0437  WBC 14.5* 14.2* 20.5*  NEUTROABS 12.5*  --   --   HGB 7.5* 7.3* 7.0*  HCT 26.1* 25.1* 23.1*  MCV 73.3* 70.3* 73.3*  PLT 296 594* 102*   Basic Metabolic Panel: Recent Labs  Lab 01/26/22 0416 01/27/22 0430 01/28/22 0635 01/29/22 0430 01/30/22 0624  NA 131* 132* 133* 136 134*  K 4.2 4.1 4.2 4.4 4.9  CL 93* 93* 98 101 102  CO2 '28 30 28 29 26  '$ GLUCOSE 251* 268* 186* 88 106*  BUN '10 10 17 15 19  '$ CREATININE 0.32* 0.38* 0.32* 0.37* 0.48  CALCIUM 7.8* 8.4* 8.3* 8.4* 8.3*  MG 2.0 1.8 2.0 2.1 2.1  PHOS  --  4.5  --  4.5 5.0*   GFR: Estimated Creatinine Clearance: 53 mL/min (by C-G formula based on SCr of 0.48 mg/dL). Liver Function Tests: Recent Labs  Lab 01/27/22 0430 01/30/22 0624  AST 56* 56*  ALT 87* 82*  ALKPHOS 89 137*  BILITOT 0.2* 0.2*  PROT 7.9 7.4   ALBUMIN 1.8* 1.6*   Recent Labs  Lab 01/30/22 0708  LIPASE 141*   No results for input(s): "AMMONIA" in the last 168 hours. Coagulation Profile: No results for input(s): "INR", "PROTIME" in the last 168 hours. Cardiac Enzymes: No results for input(s): "CKTOTAL", "CKMB", "CKMBINDEX", "TROPONINI" in the last 168 hours. BNP (last 3 results) No results for input(s): "PROBNP" in the last 8760 hours. HbA1C: No results for input(s): "HGBA1C" in the last 72 hours. CBG: Recent Labs  Lab 01/30/22 1145 01/30/22 1613 01/30/22 1927 01/30/22 2323 01/31/22 0358  GLUCAP 126* 234* 153* 233* 198*   Lipid Profile: No results for input(s): "CHOL", "HDL", "LDLCALC", "TRIG", "CHOLHDL", "LDLDIRECT" in the last 72 hours.  Thyroid Function Tests: Recent Labs    01/28/22 1030  TSH 3.043   Anemia Panel: Recent Labs    01/28/22 1017 01/28/22 1018 01/29/22 0600  VITAMINB12  --  1,070*  --   FOLATE 9.9  --   --  FERRITIN  --  56  --   TIBC  --  287  --   IRON  --  13*  --   RETICCTPCT  --   --  RESULTS UNAVAILABLE DUE TO INTERFERING SUBSTANCE   Sepsis Labs: Recent Labs  Lab 01/24/22 1449 01/28/22 0635 01/29/22 0430 01/30/22 0624  PROCALCITON  --  <0.10 0.24 0.63  LATICACIDVEN 2.8*  --   --   --     Recent Results (from the past 240 hour(s))  Culture, blood (Routine X 2) w Reflex to ID Panel     Status: None (Preliminary result)   Collection Time: 01/30/22 10:35 AM   Specimen: BLOOD  Result Value Ref Range Status   Specimen Description BLOOD LEFT ANTECUBITAL  Final   Special Requests   Final    BOTTLES DRAWN AEROBIC AND ANAEROBIC Blood Culture adequate volume   Culture   Final    NO GROWTH < 24 HOURS Performed at [facility], [place] [address] [place], [place] [address]    Report Status PENDING  Incomplete  Culture, blood (Routine X 2) w Reflex to ID Panel     Status: None (Preliminary result)   Collection Time: 01/30/22 10:37 AM   Specimen: BLOOD LEFT FOREARM   Result Value Ref Range Status   Specimen Description BLOOD LEFT FOREARM  Final   Special Requests   Final    BOTTLES DRAWN AEROBIC AND ANAEROBIC Blood Culture adequate volume   Culture   Final    NO GROWTH < 24 HOURS Performed at [facility], [place] [address], [place], [place] [address]    Report Status PENDING  Incomplete  Culture, Respiratory w Gram Stain     Status: None (Preliminary result)   Collection Time: 01/30/22  3:10 PM   Specimen: Tracheal Aspirate; Respiratory  Result Value Ref Range Status   Specimen Description TRACHEAL ASPIRATE  Final   Special Requests NONE  Final   Gram Stain   Final    NO WBC SEEN RARE GRAM POSITIVE COCCI IN PAIRS RARE GRAM POSITIVE RODS RARE GRAM NEGATIVE RODS Performed at [facility], [address], [place], [place] [address]    Culture PENDING  Incomplete   Report Status PENDING  Incomplete         Radiology Studies: US Abdomen Limited RUQ (LIVER/GB)  Result Date: 01/30/2022 CLINICAL DATA:  Cholelithiasis. EXAM: ULTRASOUND ABDOMEN LIMITED RIGHT UPPER QUADRANT COMPARISON:  Right upper quadrant abdominal ultrasound 05/06/2017, CT abdomen and pelvis without contrast 01/30/2022 FINDINGS: The study is technically limited due to overlying bowel-gas. Gallbladder: The gallbladder wall thickness measures 4 mm diffusely, mildly thickened. No gallstone is seen. Negative sonographic Murphy's sign. No pericholecystic fluid. Common bile duct: Diameter: 5 mm, within normal limits. Liver: No focal lesion identified. Limited visualization of the left hepatic lobe due to overlying bowel gas. Within normal limits in parenchymal echogenicity. Portal vein is patent on color Doppler imaging with normal direction of blood flow towards the liver. Other: None. IMPRESSION: There is mild diffuse gallbladder wall thickening, however otherwise there is no sonographic evidence of acute cholecystitis. No gallstones. Gallbladder wall thickening is nonspecific  and can be seen secondary to increased systemic fluid status and other etiologies including hypoalbuminemia, hepatitis, and chronic cholecystitis. Electronically Signed   By: Yvonne Kendall M.D.   On: 01/30/2022 13:58   CT ABDOMEN PELVIS WO CONTRAST  Result Date: 01/30/2022 CLINICAL DATA:  LEFT LOWER QUADRANT abdominal pain. Bowel obstruction suspected. EXAM:  CT ABDOMEN AND PELVIS WITHOUT CONTRAST TECHNIQUE: Multidetector CT imaging of the abdomen and pelvis was performed following the standard protocol without IV contrast. RADIATION DOSE REDUCTION: This exam was performed according to the departmental dose-optimization program which includes automated exposure control, adjustment of the mA and/or kV according to patient size and/or use of iterative reconstruction technique. COMPARISON:  01/29/2022, 01/22/2022 FINDINGS: Lower chest: Interval resolution of bilateral pleural effusions. There is minimal residual consolidation in the RIGHT LOWER lobe and minimal subsegmental atelectasis in the RIGHT LOWER lobe. Heart size is normal. No pericardial effusion. Hepatobiliary: Noncontrast appearance of the liver is unremarkable. Gallbladder is partially obscured by contrast in the colon. However, gallbladder wall is possibly thickened, measuring up to 8 millimeters. Suspect radiopaque stone within the gallbladder. Pancreas:Partially obscured by contrast material. No abnormality identified. Spleen: Normal in size without focal abnormality. Adrenals/Urinary Tract: Adrenal glands are unremarkable. Kidneys are normal, without renal calculi, focal lesion, or hydronephrosis. Interval resolution of bilateral hydronephrosis. Bladder is distended and normal in appearance. There has been resolution of bladder wall thickening since prior study. Stomach/Bowel: Status post gastric bypass surgery without evidence for obstruction. Visualized small bowel loops are unremarkable. Small bowel anastomosis in the LEFT LOWER QUADRANT. There is  residual contrast in the colon, with dilatation of the cecum, measuring up to 8.4 centimeters. No colonic mass. Vascular/Lymphatic: There is atherosclerotic calcification of the abdominal aorta. Limited evaluation of vascular structures due to lack of intravenous contrast. Reproductive: Prior hysterectomy.  No adnexal mass. Other: No ascites. Body wall edema. Subcutaneous gas in the RIGHT flank, consistent with prior injection. Musculoskeletal: Remote wedge compression fracture of T9. No suspicious lytic or blastic lesions. IMPRESSION: 1. Improvement in bilateral hydronephrosis and bladder wall thickening. 2. Postoperative changes after gastric bypass surgery. 3. Improvement in RIGHT LOWER lobe consolidation and interval resolution of bilateral pleural effusions. 4. Suspect gallbladder wall thickening. Suspect radiopaque calculus. Consider further evaluation with RIGHT UPPER QUADRANT ultrasound. 5. Dilatation and retained contrast in the colon, without evidence for large bowel obstruction. 6.  Aortic atherosclerosis.  (ICD10-I70.0) 7. Hysterectomy. 8. Anasarca. 9. Remote wedge compression fracture T9. Electronically Signed   By: Nolon Nations M.D.   On: 01/30/2022 12:01   DG Abd 1 View  Result Date: 01/29/2022 CLINICAL DATA:  Abdominal pain EXAM: ABDOMEN - 1 VIEW COMPARISON:  Previous studies including the examination done earlier today FINDINGS: Bowel gas pattern is nonspecific. There is moderate distention of ascending and transverse colon. Oral contrast seen in the small bowel loops has moved into the colon. There is interval emptying of bowel contents in the left colon since the previous study. IMPRESSION: There is no small bowel dilation. There is moderate distention of right colon, possibly suggesting ileus. There is interval passage of most of the small bowel contrast into the colon and emptying of contrast from the left colon since the study done earlier today. Electronically Signed   By: Elmer Picker M.D.   On: 01/29/2022 21:08   DG Abd Portable 1V  Result Date: 01/29/2022 CLINICAL DATA:  Abdominal distension beginning today. EXAM: PORTABLE ABDOMEN - 1 VIEW COMPARISON:  01/27/2022 FINDINGS: Contrast remains present within small and large bowel, likely indicating poor gastrointestinal motility and or constipation. No true obstructive pattern. IMPRESSION: Previously ingested contrast remains within small bowel and large bowel, likely indicating poor gastrointestinal motility and or constipation. No sign of frank bowel obstruction. Electronically Signed   By: Nelson Chimes M.D.   On: 01/29/2022 10:50  Scheduled Meds:  vitamin C  500 mg Oral Daily   bisacodyl  10 mg Rectal BID   Chlorhexidine Gluconate Cloth  6 each Topical Daily   cyanocobalamin  1,000 mcg Intramuscular Q7 days   docusate sodium  100 mg Oral BID   feeding supplement  237 mL Oral TID WC & HS   furosemide  40 mg Oral Daily   guaiFENesin  600 mg Oral BID   haloperidol lactate  2 mg Intravenous BID   And   LORazepam  1 mg Intravenous BID   heparin injection (subcutaneous)  5,000 Units Subcutaneous Q8H   insulin aspart  0-6 Units Subcutaneous Q4H   lactulose  30 g Oral TID   linaclotide  145 mcg Oral QAC breakfast   metoCLOPramide (REGLAN) injection  5 mg Intravenous Q6H   metoprolol tartrate  25 mg Oral BID   mouth rinse  15 mL Mouth Rinse 4 times per day   pantoprazole  40 mg Oral QHS   revefenacin  175 mcg Nebulization Daily   simethicone  40 mg Oral TID AC & HS   sodium chloride flush  10-40 mL Intracatheter Q12H   vitamin A  50,000 Units Intramuscular Daily   Followed by   Derrill Memo ON 02/11/2022] vitamin A  10,000 Units Oral Daily   Vitamin D (Ergocalciferol)  50,000 Units Oral Q7 days   Continuous Infusions:  sodium chloride Stopped (01/28/22 1141)   ferric gluconate (FERRLECIT) IVPB 250 mg (01/30/22 0958)   TPN ADULT (ION) 70 mL/hr at 01/30/22 1900     LOS: 19 days   Time spent= 35  mins    Camellia Popescu Arsenio Loader, MD [facility]  If 7PM-7AM, please contact night-coverage  01/31/2022, 7:45 AM

## 2022-01-31 NOTE — Progress Notes (Addendum)
PHARMACY - TOTAL PARENTERAL NUTRITION CONSULT NOTE  Indication: Prolonged ileus  Patient Measurements: Height: 5' (152.4 cm) Weight: 43.3 kg (95 lb 6.4 oz) IBW/kg (Calculated) : 45.5 TPN AdjBW (KG): 42.2 Body mass index is 18.63 kg/m. Usual weight:  240 lbs in 2017, now 88 lbs with significant weight loss over the past 3 yrs  Assessment:  10 YOF with history of polysubstance abuse and bariatric surgery presented on 7/23 s/p intentional overdose.  CT and abd XR showed large amount of stool in the colon and an ileus. She has severe PCM with minimal nutrition since admit (TF from 7/23 until 7/25), so Pharmacy consulted to manage TPN.  Pt removed CorTrak on 8/4 and TFs were discontinued at 1639. Pt passed swallow eval for Dysphagia 2 diet. Pt has eaten ~25-50% of her meals. Dietician is performing a calorie count.  Glucose / Insulin: hx DM on ?metformin PTA - hypoglycemic x1 on 8/9  CBGs increased 153-233. 5 units on sSSI in the past 24 hrs. Glargine discontinued. BG trend improving. - 15 units in TPN  Electrolytes: Na 131, Cl 101, CoCa 10.2, others WNL Renal: SCr 0.34, BUN 13 Hepatic: LFTs mildly elevated, tbili/TG WNL, albumin 1.6 Vitamin/Mineral panel: low Vit D/A/C.  Vitamins B1/B12, copper, zinc and folate WNL. Intake / Output; MIVF: UOP 2.7 ml/kg/hr, LBM on 8/10, no overt volume overload noted on exam   GI Imaging:  8/2 CT Abd/P: pleural effusions, B/L renal collecting system dilatation with concern of cystitis GI Surgeries / Procedures: none since TPN initiation   Central access: PICC placed 01/17/22 TPN start date: 01/18/22  Nutritional Goals: RD Estimated Needs Total Energy Estimated Needs: 1600-1800 kcals Total Protein Estimated Needs: 100-115 g Total Fluid Estimated Needs: >/= 1.6 L  Current Nutrition:  Soft diet and TPN  Plan:  Continue TPN to goal rate of 70 ml/hr to provide 86g AA, 244g CHO and 44g ILE for a total of 1607 kCal, meeting 100% of needs Electrolytes in  TPN: Na 50 mEq/L, K 55 mEq/L, Ca 5 mEq/L, Mg 7 mEq/L, decrease Phos to 5 mmol/L, NL:ZJQBHAL ratio 2:1 Increase to 20 units regular insulin in TPN Add standard MVI and trace elements to TPN  Change to moderate SSI Q6H (changed from Q4H for pt comfort) Standard TPN labs on Mon and Thurs Started Vit A, D and C supplementation on 8/5    > Vitamin A 100,000 units IM daily x 3 days, followed by 50,000 units IM daily x 14 days, followed by 10,000 units PO daily x 60 days   > Vitamin D2 (ergocalciferol) 50,000 units PO once weekly x 8 weeks   > Vitamin C (ascorbic acid) '500mg'$  PO daily x28 days F/u on PO diet intake, calorie count per dietician, and plan for weaning TPN    Thank you for allowing pharmacy to be a part of this patient's care.  Ardyth Harps, PharmD Clinical Pharmacist

## 2022-01-31 NOTE — Progress Notes (Signed)
Pharmacy Antibiotic Note  [name] is a 56 y.o. female admitted on 01/12/2022.  Pts hospital stay has been complicated by AMS and colonic ileus requiring TPN.  Overnight pt with increase in WBC, tachycardia, and fever.  Pharmacy has been consulted for Cefepime dosing.  Plan: Cefepime 2gm IV q12h Flagyl '500mg'$  IV q12h (per MD) Follow-up cx data and clinical progress  Height: 5' (152.4 cm) Weight: 43.3 kg (95 lb 6.4 oz) IBW/kg (Calculated) : 45.5  Temp (24hrs), Avg:99 F (37.2 C), Min:98.4 F (36.9 C), Max:100.1 F (37.8 C)  Recent Labs  Lab 01/24/22 1105 01/24/22 1449 01/25/22 0413 01/27/22 0430 01/28/22 9678 01/29/22 0430 01/30/22 0624 01/31/22 0437 01/31/22 0654  WBC 14.5*  --   --   --  14.2*  --   --  20.5*  --   CREATININE  --   --    < > 0.38* 0.32* 0.37* 0.48  --  0.34*  LATICACIDVEN  --  2.8*  --   --   --   --   --   --   --    < > = values in this interval not displayed.    Estimated Creatinine Clearance: 53 mL/min (A) (by C-G formula based on SCr of 0.34 mg/dL (L)).    Allergies  Allergen Reactions   Iron Dextran Shortness Of Breath and Anxiety   Aspirin Nausea And Vomiting and Other (See Comments)    Ok to take tylenol or ibuprofen     Antimicrobials this admission: Cefepime 8/11 >> Flagyl 8/11 >>   Rocephin (empiric UTI) 8/2 > 8/6 unasyn (asp pna) 7/26 > 7/30  Dose adjustments this admission:   Microbiology results: 8/10 BCx: ngtd 8/10 Sputum: multiple organisms, reincubated   Thank you for allowing pharmacy to be a part of this patient's care.  [employer], Pharm.D., BCPS Clinical Pharmacist Clinical phone for 01/31/2022 from 7:30-3:00 is [phone].  **Pharmacist phone directory can be found on [email or web address] listed under [place].  01/31/2022 9:23 AM

## 2022-01-31 NOTE — Consult Note (Signed)
  Patient seen and attempted to assess x 2. On both attempts she was lethargic and drowsy, she would open her eyes however she would fall back to sleep. Patient did awake to participate with physical therapy, however session was ended due to abdominal pain.   -Psychiatry will continue to follow from a distance.  -Patient with some notable lethargy, tachypnea, and febrile at times. She has been receiving prn haldol for agitation and aggression. Will obtain baseline CK (13). Hospitalist and family made aware of lethargy.  -Patient has been resumed on Cefepime, which will likely place her risk for worsening delirium. Continue delirium precautions.  - Will continue medications and suicide sitter at this time.

## 2022-02-01 ENCOUNTER — Inpatient Hospital Stay (HOSPITAL_COMMUNITY): Payer: Medicare Other

## 2022-02-01 DIAGNOSIS — T50902D Poisoning by unspecified drugs, medicaments and biological substances, intentional self-harm, subsequent encounter: Secondary | ICD-10-CM | POA: Diagnosis not present

## 2022-02-01 DIAGNOSIS — K567 Ileus, unspecified: Secondary | ICD-10-CM | POA: Diagnosis not present

## 2022-02-01 LAB — CBC
HCT: 25.2 % — ABNORMAL LOW (ref 36.0–46.0)
Hemoglobin: 7.1 g/dL — ABNORMAL LOW (ref 12.0–15.0)
MCH: 20.5 pg — ABNORMAL LOW (ref 26.0–34.0)
MCHC: 28.2 g/dL — ABNORMAL LOW (ref 30.0–36.0)
MCV: 72.8 fL — ABNORMAL LOW (ref 80.0–100.0)
Platelets: 357 10*3/uL (ref 150–400)
RBC: 3.46 MIL/uL — ABNORMAL LOW (ref 3.87–5.11)
RDW: 29 % — ABNORMAL HIGH (ref 11.5–15.5)
WBC: 19.8 10*3/uL — ABNORMAL HIGH (ref 4.0–10.5)
nRBC: 0 % (ref 0.0–0.2)

## 2022-02-01 LAB — CULTURE, RESPIRATORY W GRAM STAIN
Culture: NORMAL
Gram Stain: NONE SEEN

## 2022-02-01 LAB — BASIC METABOLIC PANEL
Anion gap: 6 (ref 5–15)
BUN: 12 mg/dL (ref 6–20)
CO2: 23 mmol/L (ref 22–32)
Calcium: 8.2 mg/dL — ABNORMAL LOW (ref 8.9–10.3)
Chloride: 104 mmol/L (ref 98–111)
Creatinine, Ser: 0.39 mg/dL — ABNORMAL LOW (ref 0.44–1.00)
GFR, Estimated: >60 mL/min (ref >60)
Glucose, Bld: 162 mg/dL — ABNORMAL HIGH (ref 70–99)
Potassium: 4.4 mmol/L (ref 3.5–5.1)
Sodium: 133 mmol/L — ABNORMAL LOW (ref 135–145)

## 2022-02-01 LAB — BPAM RBC
Blood Product Expiration Date: 202308152359
Blood Product Expiration Date: 202308162359
Unit Type and Rh: 6200
Unit Type and Rh: 6200

## 2022-02-01 LAB — TYPE AND SCREEN
ABO/RH(D): A POS
Antibody Screen: POSITIVE
DAT, IgG: NEGATIVE
Donor AG Type: NEGATIVE
Unit division: 0
Unit division: 0

## 2022-02-01 LAB — GLUCOSE, CAPILLARY
Glucose-Capillary: 140 mg/dL — ABNORMAL HIGH (ref 70–99)
Glucose-Capillary: 168 mg/dL — ABNORMAL HIGH (ref 70–99)
Glucose-Capillary: 206 mg/dL — ABNORMAL HIGH (ref 70–99)
Glucose-Capillary: 270 mg/dL — ABNORMAL HIGH (ref 70–99)
Glucose-Capillary: 323 mg/dL — ABNORMAL HIGH (ref 70–99)

## 2022-02-01 LAB — PHOSPHORUS: Phosphorus: 3.2 mg/dL (ref 2.5–4.6)

## 2022-02-01 LAB — MAGNESIUM: Magnesium: 1.8 mg/dL (ref 1.7–2.4)

## 2022-02-01 LAB — PROCALCITONIN: Procalcitonin: 0.45 ng/mL

## 2022-02-01 LAB — BRAIN NATRIURETIC PEPTIDE: B Natriuretic Peptide: 131.5 pg/mL — ABNORMAL HIGH (ref 0.0–100.0)

## 2022-02-01 MED ORDER — FUROSEMIDE 10 MG/ML IJ SOLN
60.0000 mg | Freq: Once | INTRAMUSCULAR | Status: AC
  Administered 2022-02-01: 60 mg via INTRAVENOUS
  Filled 2022-02-01: qty 6

## 2022-02-01 MED ORDER — FENTANYL CITRATE PF 50 MCG/ML IJ SOSY
25.0000 ug | PREFILLED_SYRINGE | INTRAMUSCULAR | Status: AC | PRN
  Administered 2022-02-01 – 2022-02-02 (×2): 25 ug via INTRAVENOUS
  Filled 2022-02-01 (×2): qty 1

## 2022-02-01 MED ORDER — FUROSEMIDE 10 MG/ML IJ SOLN
40.0000 mg | Freq: Once | INTRAMUSCULAR | Status: AC
  Administered 2022-02-01: 40 mg via INTRAVENOUS
  Filled 2022-02-01: qty 4

## 2022-02-01 MED ORDER — INSULIN ASPART 100 UNIT/ML IJ SOLN
0.0000 [IU] | INTRAMUSCULAR | Status: DC
  Administered 2022-02-01: 5 [IU] via SUBCUTANEOUS
  Administered 2022-02-01: 11 [IU] via SUBCUTANEOUS
  Administered 2022-02-02: 3 [IU] via SUBCUTANEOUS
  Administered 2022-02-02: 5 [IU] via SUBCUTANEOUS
  Administered 2022-02-02: 3 [IU] via SUBCUTANEOUS
  Administered 2022-02-02: 8 [IU] via SUBCUTANEOUS
  Administered 2022-02-03 (×2): 5 [IU] via SUBCUTANEOUS
  Administered 2022-02-03 (×2): 2 [IU] via SUBCUTANEOUS
  Administered 2022-02-03: 5 [IU] via SUBCUTANEOUS
  Administered 2022-02-04 (×2): 2 [IU] via SUBCUTANEOUS
  Administered 2022-02-04 (×3): 3 [IU] via SUBCUTANEOUS
  Administered 2022-02-05: 5 [IU] via SUBCUTANEOUS
  Administered 2022-02-05 – 2022-02-06 (×2): 3 [IU] via SUBCUTANEOUS
  Administered 2022-02-06: 5 [IU] via SUBCUTANEOUS
  Administered 2022-02-06: 3 [IU] via SUBCUTANEOUS
  Administered 2022-02-06: 8 [IU] via SUBCUTANEOUS
  Administered 2022-02-06: 5 [IU] via SUBCUTANEOUS
  Administered 2022-02-07: 2 [IU] via SUBCUTANEOUS
  Administered 2022-02-07: 3 [IU] via SUBCUTANEOUS

## 2022-02-01 MED ORDER — TAMSULOSIN HCL 0.4 MG PO CAPS
0.4000 mg | ORAL_CAPSULE | Freq: Every day | ORAL | Status: DC
  Administered 2022-02-01 – 2022-02-10 (×9): 0.4 mg via ORAL
  Filled 2022-02-01 (×10): qty 1

## 2022-02-01 MED ORDER — TRAVASOL 10 % IV SOLN
INTRAVENOUS | Status: DC
  Filled 2022-02-01: qty 856.8

## 2022-02-01 MED ORDER — FERROUS SULFATE 325 (65 FE) MG PO TABS
325.0000 mg | ORAL_TABLET | Freq: Two times a day (BID) | ORAL | Status: DC
  Administered 2022-02-01 – 2022-02-12 (×22): 325 mg via ORAL
  Filled 2022-02-01 (×22): qty 1

## 2022-02-01 MED ORDER — POTASSIUM CHLORIDE 20 MEQ PO PACK
20.0000 meq | PACK | Freq: Once | ORAL | Status: AC
  Administered 2022-02-01: 20 meq via ORAL
  Filled 2022-02-01: qty 1

## 2022-02-01 MED ORDER — FUROSEMIDE 40 MG PO TABS: 40.0000 mg | ORAL_TABLET | Freq: Every day | ORAL | Status: DC

## 2022-02-01 MED ORDER — MAGNESIUM SULFATE 2 GM/50ML IV SOLN
2.0000 g | Freq: Once | INTRAVENOUS | Status: AC
  Administered 2022-02-01: 2 g via INTRAVENOUS
  Filled 2022-02-01: qty 50

## 2022-02-01 MED ORDER — FOLIC ACID 1 MG PO TABS
1.0000 mg | ORAL_TABLET | Freq: Every day | ORAL | Status: DC
  Administered 2022-02-01: 1 mg via ORAL
  Filled 2022-02-01: qty 1

## 2022-02-01 NOTE — Progress Notes (Signed)
[name] ROUNDING NOTE   Subjective: Flexi-Seal accidentally removed/dislodged today.  No plan to replace.  BM per review of notes, but patient unable to provide much in the way history.  Repeat x-ray today with contrast noted into the rectum, otherwise nonobstructive bowel gas pattern.  K4.4, mag 1.8.  Objective: Vital signs in last 24 hours: Temp:  [97.8 F (36.6 C)-100.5 F (38.1 C)] 97.8 F (36.6 C) (08/12 1400) Pulse Rate:  [84-122] 108 (08/12 1522) Resp:  [26-47] 26 (08/12 1522) BP: (108-137)/(56-87) 126/71 (08/12 1522) SpO2:  [88 %-98 %] 92 % (08/12 1522) FiO2 (%):  [40 %-60 %] 60 % (08/12 1522) Weight:  [44.9 kg] 44.9 kg (08/12 0500) Last BM Date : 01/31/22 General: NAD Abdomen: TTP in epigastrium.  Abdomen otherwise soft, +BS    Intake/Output from previous day: 08/11 0701 - 08/12 0700 In: 1556.6 [P.O.:540; I.V.:606.1; IV Piggyback:410.5] Out: 2755 [Urine:2755] Intake/Output this shift: Total I/O In: -  Out: 1750 [Urine:1750]   Lab Results: Recent Labs    01/31/22 0437 02/01/22 0634  WBC 20.5* 19.8*  HGB 7.0* 7.1*  PLT 455* 357  MCV 73.3* 72.8*   BMET Recent Labs    01/30/22 0624 01/31/22 0654 02/01/22 0634  NA 134* 131* 133*  K 4.9 4.2 4.4  CL 102 101 104  CO2 '26 25 23  '$ GLUCOSE 106* 157* 162*  BUN '19 13 12  '$ CREATININE 0.48 0.34* 0.39*  CALCIUM 8.3* 8.2* 8.2*   LFT Recent Labs    01/30/22 0624 01/31/22 0654  PROT 7.4 7.0  ALBUMIN 1.6* 1.6*  AST 56* 37  ALT 82* 72*  ALKPHOS 137* 146*  BILITOT 0.2* 0.2*  BILIDIR  --  <0.1  IBILI  --  NOT CALCULATED   PT/INR No results for input(s): "INR" in the last 72 hours.    Imaging/Other results: DG Chest Port 1 View  Result Date: 02/01/2022 CLINICAL DATA:  56 year old female with history of shortness of breath. EXAM: PORTABLE CHEST 1 VIEW COMPARISON:  Chest x-ray 01/31/2022. FINDINGS: A tracheostomy tube is in place with tip 5.7 cm above the carina. There is a right upper  extremity PICC with tip terminating in the superior cavoatrial junction. Lung volumes are low. Bibasilar opacities (right greater than left) likely subsegmental atelectasis. Blunting of the right costophrenic sulcus suggestive of a small right pleural effusion. No pneumothorax. Diffuse interstitial prominence and peribronchial cuffing. No confluent consolidative airspace disease. No evidence of pulmonary edema. Heart size is mildly enlarged. Upper mediastinal contours are within normal limits. IMPRESSION: 1. Support apparatus, as above. 2. Diffuse interstitial prominence and peribronchial cuffing, concerning for an acute bronchitis. 3. Small right pleural effusion. 4. Low lung volumes with bibasilar areas of subsegmental atelectasis. Electronically Signed   By: Vinnie Langton M.D.   On: 02/01/2022 08:18   DG Abd 1 View  Result Date: 02/01/2022 CLINICAL DATA:  56 year old female history of productive cough. Abdominal pain. EXAM: ABDOMEN - 1 VIEW COMPARISON:  01/31/2022. FINDINGS: There continues to be oral contrast material in the colon and rectum, with minimal progression compared to the prior study. No pathologic dilatation of small bowel or colon. No pneumoperitoneum. IMPRESSION: 1. Nonobstructive bowel gas pattern. 2. No pneumoperitoneum. Electronically Signed   By: Vinnie Langton M.D.   On: 02/01/2022 08:17   DG Chest Port 1 View  Result Date: 01/31/2022 CLINICAL DATA:  Shortness of breath.  Chest pain. EXAM: PORTABLE CHEST 1 VIEW COMPARISON:  January 28, 2022 FINDINGS: A right PICC line terminates in  the central SVC. Stable cardiomegaly. The tracheostomy tube is stable. The hila and mediastinum are unremarkable. No pneumothorax. No nodules or masses. Mild bibasilar subsegmental atelectasis. Possible tiny effusions. No evidence of pneumonia. No overt edema. IMPRESSION: Possible small effusions with underlying atelectasis. Possible mild pulmonary venous congestion. No focal infiltrate or overt edema.  Electronically Signed   By: Dorise Bullion III M.D.   On: 01/31/2022 18:34   DG Abd 1 View  Result Date: 01/31/2022 CLINICAL DATA:  Abdominal pain. Drug overdose age. Follow-up study. EXAM: ABDOMEN - 1 VIEW COMPARISON:  01/29/2022 and older exams.  CT, 01/30/2022. FINDINGS: There is residual contrast outlining a distended colon, contrast having progressed into the left colon since the prior exam. Colonic distension is without significant change. No small bowel dilation. Soft tissues are poorly defined, grossly unremarkable. IMPRESSION: 1. Findings similar to the recent prior exams. There is colonic distension suggesting a colonic ileus. There has been some progression colonic contrast into the left colon since the previous abdominal radiographs. No evidence of bowel obstruction. Electronically Signed   By: Lajean Manes M.D.   On: 01/31/2022 15:22      Assessment and Plan:  1) Colonic ileus 2) Abdominal pain - Continue current bowel regimen to include Colace, Dulcolax suppositories, Linzess, simethicone, along with MiraLAX prn to facilitate BM - Continue Reglan for the time being - Continue encouraging ambulation with assist as much as possible - Continue lateral rotations every 2 hours while in bed - Continue daily CHEM panel with goal K >4, Mg >2 -GI service will follow peripherally for the remainder of the weekend.  Dr. Benson Norway will resume her care on Monday morning.  Please do not hesitate to contact with questions or concerns in the interim   Lavena Bullion, DO  02/01/2022, 4:23 PM [facility] Pager [phone]

## 2022-02-01 NOTE — Progress Notes (Addendum)
PHARMACY - TOTAL PARENTERAL NUTRITION CONSULT NOTE  Indication: Prolonged ileus  Patient Measurements: Height: 5' (152.4 cm) Weight: 44.9 kg (98 lb 15.8 oz) IBW/kg (Calculated) : 45.5 TPN AdjBW (KG): 42.2 Body mass index is 19.33 kg/m. Usual weight:  240 lbs in 2017, now 88 lbs with significant weight loss over the past 3 yrs  Assessment:  2 YOF with history of polysubstance abuse and bariatric surgery presented on 7/23 s/p intentional overdose.  CT and abd XR showed large amount of stool in the colon and an ileus. She has severe PCM with minimal nutrition since admit (TF from 7/23 until 7/25), so Pharmacy consulted to manage TPN.  Glucose / Insulin: hx DM on metformin PTA - hypoglycemic x1 on 8/9   CBGs elevated 160-210s Used 5 units on sSSI, 20 units in TPN  Electrolytes: low Na, Mag 1.8 (goal >/= 2), others WNL Renal: SCr < 1 stable, BUN WNL - Lasix '40mg'$  PO daily with additional Lasix '40mg'$  IV on 8/11 Hepatic: LFTs mildly elevated, tbili/TG WNL, albumin 1.4, lipase 145, NH4 53 Vitamin/Mineral panel: Vitamins B1/B12, copper, zinc and folate WNL. Started Vit A, D and C supplementation on 8/5  > Vitamin A 100K units IM daily x3, then 50K units IM daily x14, then 10K PO daily x60  > Vitamin D2 (ergocalciferol) 50,000 units PO once weekly x 8 weeks  > Vitamin C '500mg'$  PO daily x28 days Intake / Output; MIVF: UOP 2.6 ml/kg/hr with Lasix, LBM 8/10 GI Imaging:  8/2 CT: pleural effusions, B/L renal collecting system dilatation with concern of cystitis 8/11 Abx XR: colonic ileus, no SBO GI Surgeries / Procedures: none since TPN initiation   Central access: PICC placed 01/17/22 TPN start date: 01/18/22  Nutritional Goals: RD Estimated Needs Total Energy Estimated Needs: 1600-1800 kcals Total Protein Estimated Needs: 100-115 g Total Fluid Estimated Needs: >/= 1.6 L  Current Nutrition:  TPN Dysphagia diet - eating 10% of meals Ensure Enlive BID - 2 charted given yesterday 8/11 calorie  count:  insufficient PO intake  Plan:  Continue TPN to goal rate of 70 ml/hr to provide 86g AA, 244g CHO and 44g ILE for a total of 1607 kCal, meeting 100% of needs Electrolytes in TPN: increase Na to 86mq/L, reduce K to 543m/L, Ca 72m272mL, increase Mg to 9mE672m, decrease Phos to 5 mmol/L on 8/11, Cl:Ac 2:1 Add standard MVI and trace elements to TPN  Increase SSI to moderate Q6H (changed from Q4H for pt comfort) and increase regular insulin in TPN to 25 units  Mag sulfate 2gm IV (Lasix '60mg'$  IV per MD) Standard TPN labs on Mon and Thurs F/u PO diet intake and plan for cortrak when possible Monitor volume status and the need to concentrate TPN Monitor fever curve Phos still IP and TPN order cut-off time is approaching.  Address tomorrow if needed.  Celeste Tavenner D. DangMina MarblearmD, BCPS, BCCCShortsville2/2023, 11:45 AM  ===================  Addendum:  phos down to WNL  Stephon Weathers D. DangMina MarblearmD, BCPS, BCCCHart2/2023, 1:32 PM

## 2022-02-01 NOTE — Progress Notes (Addendum)
PROGRESS NOTE    [name]  WUJ:[id number] DOB: 1966-04-15 DOA: 01/12/2022 PCP: [name], MD   Brief Narrative:  56 year old with schizoaffective disorder, depression, polysubstance abuse, COPD, HTN, DM2 for suicide attempt and overdosing on promethazine and alcohol.  Initially patient was intubated due to GCS of 3 complicated by aspiration pneumonia.  Eventually when trach placement on 7/30 and transition to trach collar 7/31.  Other complicated issues that she developed ileus, urinary retention with intermittent fluid overload.  Ongoing trach management by PCCM, GI consulted for colonic ileus.  Current events and procedures 7/23 ED after etoh consumption and intentional promethazine overdose. Intubated. Admit to ICU  7/24 Tachycardic to 130s overnight, also with episode of whole body stiffening, ?dystonia vs. seizure. EEG with irritability, LTM initiated. Neuro consulted. 7/25 Extubated, intermittently somnolent/agitated, not managing secretions well. O2 sats stable on [place]. Cortrak difficult to flush. 7/26 Reintubated early AM for hypoxia, airway protection. Unasyn initiated for ?aspiration PNA. LTM EEG discontinued. MRI pending. 7/27 MRI no acute changes. Wakes up follows commands on .7 dex  7/28 off NE  01/19/2022>> Trach after goals of care discussion with daughter 7/31 Transitioned to ATC.  8/2 CT abdomen: Stool in right colon.  Normal appendix.  Placed gastric bypass surgery.  Nasogastric tube in stomach.  No evidence of wall thickening or obstruction and bowel.  Questionable cystitis.  Bibasilar pleural effusions.  Ceftriaxone started. 01/28/2022.  Transferred to hospitalist service on day 16 of her hospital stay  Assessment & Plan:   Acute hypoxic respiratory failure secondary to aspiration, Status post tracheostomy - Tracheostomy placed due to recurrent intubation.  Has 6 mm cuffed Shiley.  Routine trach care.  IV Lasix repeated on 02/01/2022 due to fluid overload, also  antibiotics were commenced by previous team on 01/31/2022.  Monitor.  Continue trach collar, pulmonary toiletry with trach care.  Monitor closely.   Alcohol and promethazine overdose with likely attempted suicide with history of schizoaffective disorder bipolar type, history of polysubstance abuse.  Seen by psych, currently stable continue to monitor.  For now on supportive care.  On as needed Ativan and Haldol.  Psych team following.   ?? History of seizure disorder.  Currently on as needed Ativan.  EEG here nonacute.  Seen by neurology initially was on Keppra this was discontinued by neurology on 01/19/2022.   Colonic dysmotility with obstipation and some intermittent urinary retention.  On bowel regimen with bowel movements now, abdominal x-ray nonacute, some suprapubic fullness, was toward bladder scans were positive for over 300 cc of retention, received Foley catheter.  Start Flomax on 02/01/2022.  Monitor.   Microcytic anemia.  Check anemia panel, type screen.  Received IV iron, placed on ferrous sulfate along with Folex acid, family and patient agreeable for transfusion if needed.   Moderate protein calorie malnutrition.  On oral diet and IV TNA via right arm PICC line.   History of tobacco abuse and COPD.  No acute issues counseled to quit tobacco and alcohol.   GERD.  On PPI.     DM type II.  On long-acting insulin along with sliding scale.  CBG (last 3)  Recent Labs    01/31/22 2348 02/01/22 0551 02/01/22 1118  GLUCAP 203* 168* 206*   Lab Results  Component Value Date   HGBA1C 7.4 (H) 11/14/2021     DVT prophylaxis: Subcu heparin Code Status: Full  Family Communication:  Family at bedside.   Status is: Inpatient Remains inpatient appropriate because: On going eval for  fevers and abd pain   Nutritional status    Signs/Symptoms: severe fat depletion, severe muscle depletion  Interventions: Refer to RD note for recommendations, Ensure Enlive (each supplement  provides 350kcal and 20 grams of protein), MVI  Body mass index is 19.33 kg/m.     Subjective:   Patient in bed, appears comfortable, denies any headache, no fever, no chest pain or pressure, ++ shortness of breath , no abdominal pain. No new focal weakness.   Examination:  Awake Alert, No new F.N deficits, tracheostomy in place with trach collar, R. Arm PICC, Foley Benson.AT,PERRAL Supple Neck, No JVD,   Symmetrical Chest wall movement, Good air movement bilaterally, ++ rales RRR,No Gallops, Rubs or new Murmurs,  +ve B.Sounds, Abd Soft, No tenderness,   No Cyanosis,      Objective: Vitals:   02/01/22 0500 02/01/22 0731 02/01/22 0853 02/01/22 1140  BP:   133/73 108/62  Pulse:   (!) 108 92  Resp:   (!) 26 (!) 26  Temp:  98.4 F (36.9 C)    TempSrc:  Oral    SpO2:   94% 98%  Weight: 44.9 kg     Height:        Intake/Output Summary (Last 24 hours) at 02/01/2022 1212 Last data filed at 02/01/2022 0912 Gross per 24 hour  Intake 606.18 ml  Output 3680 ml  Net -3073.82 ml   Filed Weights   01/30/22 0330 01/31/22 0314 02/01/22 0500  Weight: 43.2 kg 43.3 kg 44.9 kg     Data Reviewed:   Recent Labs  Lab 01/28/22 0635 01/31/22 0437 02/01/22 0634  WBC 14.2* 20.5* 19.8*  HGB 7.3* 7.0* 7.1*  HCT 25.1* 23.1* 25.2*  PLT 594* 455* 357  MCV 70.3* 73.3* 72.8*  MCH 20.4* 22.2* 20.5*  MCHC 29.1* 30.3 28.2*  RDW 29.0* 29.7* 29.0*    Recent Labs  Lab 01/27/22 0430 01/28/22 0635 01/28/22 1030 01/29/22 0430 01/30/22 0624 01/31/22 0654 01/31/22 1235 01/31/22 1620 02/01/22 0634  NA 132* 133*  --  136 134* 131*  --   --  133*  K 4.1 4.2  --  4.4 4.9 4.2  --   --  4.4  CL 93* 98  --  101 102 101  --   --  104  CO2 30 28  --  '29 26 25  '$ --   --  23  GLUCOSE 268* 186*  --  88 106* 157*  --   --  162*  BUN 10 17  --  '15 19 13  '$ --   --  12  CREATININE 0.38* 0.32*  --  0.37* 0.48 0.34*  --   --  0.39*  CALCIUM 8.4* 8.3*  --  8.4* 8.3* 8.2*  --   --  8.2*  AST 56*  --   --    --  56* 37  --   --   --   ALT 87*  --   --   --  82* 72*  --   --   --   ALKPHOS 89  --   --   --  137* 146*  --   --   --   BILITOT 0.2*  --   --   --  0.2* 0.2*  --   --   --   ALBUMIN 1.8*  --   --   --  1.6* 1.6*  --   --   --   MG 1.8 2.0  --  2.1 2.1 1.8  --   --  1.8  PHOS 4.5  --   --  4.5 5.0*  --   --   --   --   PROCALCITON  --  <0.10  --  0.24 0.63  --   --   --  0.45  LATICACIDVEN  --   --   --   --   --   --  1.3  --   --   TSH  --   --  3.043  --   --   --   --   --   --   AMMONIA  --   --   --   --   --   --   --  53*  --   BNP  --  20.6  --   --   --   --   --   --  131.5*     Radiology Studies: DG Chest Port 1 View  Result Date: 02/01/2022 CLINICAL DATA:  56 year old female with history of shortness of breath. EXAM: PORTABLE CHEST 1 VIEW COMPARISON:  Chest x-ray 01/31/2022. FINDINGS: A tracheostomy tube is in place with tip 5.7 cm above the carina. There is a right upper extremity PICC with tip terminating in the superior cavoatrial junction. Lung volumes are low. Bibasilar opacities (right greater than left) likely subsegmental atelectasis. Blunting of the right costophrenic sulcus suggestive of a small right pleural effusion. No pneumothorax. Diffuse interstitial prominence and peribronchial cuffing. No confluent consolidative airspace disease. No evidence of pulmonary edema. Heart size is mildly enlarged. Upper mediastinal contours are within normal limits. IMPRESSION: 1. Support apparatus, as above. 2. Diffuse interstitial prominence and peribronchial cuffing, concerning for an acute bronchitis. 3. Small right pleural effusion. 4. Low lung volumes with bibasilar areas of subsegmental atelectasis. Electronically Signed   By: Vinnie Langton M.D.   On: 02/01/2022 08:18   DG Abd 1 View  Result Date: 02/01/2022 CLINICAL DATA:  56 year old female history of productive cough. Abdominal pain. EXAM: ABDOMEN - 1 VIEW COMPARISON:  01/31/2022. FINDINGS: There continues to be oral  contrast material in the colon and rectum, with minimal progression compared to the prior study. No pathologic dilatation of small bowel or colon. No pneumoperitoneum. IMPRESSION: 1. Nonobstructive bowel gas pattern. 2. No pneumoperitoneum. Electronically Signed   By: Vinnie Langton M.D.   On: 02/01/2022 08:17   DG Chest Port 1 View  Result Date: 01/31/2022 CLINICAL DATA:  Shortness of breath.  Chest pain. EXAM: PORTABLE CHEST 1 VIEW COMPARISON:  January 28, 2022 FINDINGS: A right PICC line terminates in the central SVC. Stable cardiomegaly. The tracheostomy tube is stable. The hila and mediastinum are unremarkable. No pneumothorax. No nodules or masses. Mild bibasilar subsegmental atelectasis. Possible tiny effusions. No evidence of pneumonia. No overt edema. IMPRESSION: Possible small effusions with underlying atelectasis. Possible mild pulmonary venous congestion. No focal infiltrate or overt edema. Electronically Signed   By: Dorise Bullion III M.D.   On: 01/31/2022 18:34   DG Abd 1 View  Result Date: 01/31/2022 CLINICAL DATA:  Abdominal pain. Drug overdose age. Follow-up study. EXAM: ABDOMEN - 1 VIEW COMPARISON:  01/29/2022 and older exams.  CT, 01/30/2022. FINDINGS: There is residual contrast outlining a distended colon, contrast having progressed into the left colon since the prior exam. Colonic distension is without significant change. No small bowel dilation. Soft tissues are poorly defined, grossly unremarkable. IMPRESSION: 1. Findings similar to the recent prior exams. There is  colonic distension suggesting a colonic ileus. There has been some progression colonic contrast into the left colon since the previous abdominal radiographs. No evidence of bowel obstruction. Electronically Signed   By: Lajean Manes M.D.   On: 01/31/2022 15:22   US Abdomen Limited RUQ (LIVER/GB)  Result Date: 01/30/2022 CLINICAL DATA:  Cholelithiasis. EXAM: ULTRASOUND ABDOMEN LIMITED RIGHT UPPER QUADRANT COMPARISON:   Right upper quadrant abdominal ultrasound 05/06/2017, CT abdomen and pelvis without contrast 01/30/2022 FINDINGS: The study is technically limited due to overlying bowel-gas. Gallbladder: The gallbladder wall thickness measures 4 mm diffusely, mildly thickened. No gallstone is seen. Negative sonographic Murphy's sign. No pericholecystic fluid. Common bile duct: Diameter: 5 mm, within normal limits. Liver: No focal lesion identified. Limited visualization of the left hepatic lobe due to overlying bowel gas. Within normal limits in parenchymal echogenicity. Portal vein is patent on color Doppler imaging with normal direction of blood flow towards the liver. Other: None. IMPRESSION: There is mild diffuse gallbladder wall thickening, however otherwise there is no sonographic evidence of acute cholecystitis. No gallstones. Gallbladder wall thickening is nonspecific and can be seen secondary to increased systemic fluid status and other etiologies including hypoalbuminemia, hepatitis, and chronic cholecystitis. Electronically Signed   By: Yvonne Kendall M.D.   On: 01/30/2022 13:58     Scheduled Meds:   vitamin C  500 mg Oral Daily   bisacodyl  10 mg Rectal BID   Chlorhexidine Gluconate Cloth  6 each Topical Daily   cyanocobalamin  1,000 mcg Intramuscular Q7 days   docusate sodium  100 mg Oral BID   feeding supplement  237 mL Oral TID WC & HS   [START ON 02/02/2022] furosemide  40 mg Oral Daily   guaiFENesin  600 mg Oral BID   haloperidol lactate  2 mg Intravenous BID   And   LORazepam  1 mg Intravenous BID   heparin injection (subcutaneous)  5,000 Units Subcutaneous Q8H   insulin aspart  0-15 Units Subcutaneous Q4H   lactulose  30 g Oral TID   linaclotide  145 mcg Oral QAC breakfast   metoCLOPramide (REGLAN) injection  5 mg Intravenous Q6H   metoprolol tartrate  25 mg Oral BID   mouth rinse  15 mL Mouth Rinse 4 times per day   pantoprazole  40 mg Oral QHS   revefenacin  175 mcg Nebulization Daily    simethicone  40 mg Oral TID AC & HS   sodium chloride flush  10-40 mL Intracatheter Q12H   vitamin A  50,000 Units Intramuscular Daily   Followed by   Derrill Memo ON 02/11/2022] vitamin A  10,000 Units Oral Daily   Vitamin D (Ergocalciferol)  50,000 Units Oral Q7 days   Continuous Infusions:  sodium chloride Stopped (01/31/22 1226)   ceFEPime (MAXIPIME) IV 2 g (02/01/22 0853)   magnesium sulfate bolus IVPB     metronidazole 500 mg (02/01/22 1039)   TPN ADULT (ION)       LOS: 20 days   Time spent= 35 mins    Lala Lund, MD [facility]  If 7PM-7AM, please contact night-coverage  02/01/2022, 12:12 PM

## 2022-02-02 DIAGNOSIS — T50902D Poisoning by unspecified drugs, medicaments and biological substances, intentional self-harm, subsequent encounter: Secondary | ICD-10-CM | POA: Diagnosis not present

## 2022-02-02 LAB — CBC WITH DIFFERENTIAL/PLATELET
Abs Immature Granulocytes: 0.21 10*3/uL — ABNORMAL HIGH (ref 0.00–0.07)
Basophils Absolute: 0 10*3/uL (ref 0.0–0.1)
Basophils Relative: 0 %
Eosinophils Absolute: 0 10*3/uL (ref 0.0–0.5)
Eosinophils Relative: 0 %
HCT: 24 % — ABNORMAL LOW (ref 36.0–46.0)
Hemoglobin: 7.2 g/dL — ABNORMAL LOW (ref 12.0–15.0)
Immature Granulocytes: 1 %
Lymphocytes Relative: 13 %
Lymphs Abs: 2.6 10*3/uL (ref 0.7–4.0)
MCH: 21.3 pg — ABNORMAL LOW (ref 26.0–34.0)
MCHC: 30 g/dL (ref 30.0–36.0)
MCV: 71 fL — ABNORMAL LOW (ref 80.0–100.0)
Monocytes Absolute: 1.7 10*3/uL — ABNORMAL HIGH (ref 0.1–1.0)
Monocytes Relative: 8 %
Neutro Abs: 16.6 10*3/uL — ABNORMAL HIGH (ref 1.7–7.7)
Neutrophils Relative %: 78 %
Platelets: 322 10*3/uL (ref 150–400)
RBC: 3.38 MIL/uL — ABNORMAL LOW (ref 3.87–5.11)
RDW: 29.8 % — ABNORMAL HIGH (ref 11.5–15.5)
Smear Review: NORMAL
WBC: 21.1 10*3/uL — ABNORMAL HIGH (ref 4.0–10.5)
nRBC: 0 % (ref 0.0–0.2)

## 2022-02-02 LAB — MAGNESIUM: Magnesium: 2.2 mg/dL (ref 1.7–2.4)

## 2022-02-02 LAB — GLUCOSE, CAPILLARY
Glucose-Capillary: 67 mg/dL — ABNORMAL LOW (ref 70–99)
Glucose-Capillary: 69 mg/dL — ABNORMAL LOW (ref 70–99)
Glucose-Capillary: 78 mg/dL (ref 70–99)
Glucose-Capillary: 173 mg/dL — ABNORMAL HIGH (ref 70–99)
Glucose-Capillary: 239 mg/dL — ABNORMAL HIGH (ref 70–99)
Glucose-Capillary: 179 mg/dL — ABNORMAL HIGH (ref 70–99)
Glucose-Capillary: 217 mg/dL — ABNORMAL HIGH (ref 70–99)

## 2022-02-02 LAB — BASIC METABOLIC PANEL
Anion gap: 6 (ref 5–15)
BUN: 23 mg/dL — ABNORMAL HIGH (ref 6–20)
CO2: 24 mmol/L (ref 22–32)
Calcium: 8.5 mg/dL — ABNORMAL LOW (ref 8.9–10.3)
Chloride: 105 mmol/L (ref 98–111)
Creatinine, Ser: 0.42 mg/dL — ABNORMAL LOW (ref 0.44–1.00)
GFR, Estimated: >60 mL/min (ref >60)
Glucose, Bld: 66 mg/dL — ABNORMAL LOW (ref 70–99)
Potassium: 4.3 mmol/L (ref 3.5–5.1)
Sodium: 135 mmol/L (ref 135–145)

## 2022-02-02 LAB — PROCALCITONIN: Procalcitonin: 0.72 ng/mL

## 2022-02-02 LAB — BRAIN NATRIURETIC PEPTIDE: B Natriuretic Peptide: 82.1 pg/mL (ref 0.0–100.0)

## 2022-02-02 MED ORDER — METOPROLOL TARTRATE 50 MG PO TABS
50.0000 mg | ORAL_TABLET | Freq: Two times a day (BID) | ORAL | Status: DC
  Administered 2022-02-02 – 2022-02-04 (×5): 50 mg via ORAL
  Filled 2022-02-02 (×5): qty 1

## 2022-02-02 MED ORDER — METOPROLOL TARTRATE 25 MG PO TABS
25.0000 mg | ORAL_TABLET | Freq: Once | ORAL | Status: AC
  Administered 2022-02-02: 25 mg via ORAL
  Filled 2022-02-02: qty 1

## 2022-02-02 MED ORDER — ADULT MULTIVITAMIN W/MINERALS CH
1.0000 | ORAL_TABLET | Freq: Every day | ORAL | Status: DC
  Administered 2022-02-02 – 2022-02-11 (×9): 1 via ORAL
  Filled 2022-02-02 (×9): qty 1

## 2022-02-02 MED ORDER — FENTANYL CITRATE PF 50 MCG/ML IJ SOSY
25.0000 ug | PREFILLED_SYRINGE | INTRAMUSCULAR | Status: DC | PRN
  Filled 2022-02-02: qty 1

## 2022-02-02 MED ORDER — FUROSEMIDE 40 MG PO TABS
40.0000 mg | ORAL_TABLET | Freq: Every day | ORAL | Status: DC
  Administered 2022-02-03 – 2022-02-04 (×2): 40 mg via ORAL
  Filled 2022-02-02 (×2): qty 1

## 2022-02-02 MED ORDER — TRACE MINERALS CU-MN-SE-ZN 300-55-60-3000 MCG/ML IV SOLN
INTRAVENOUS | Status: DC
  Filled 2022-02-02: qty 739.2

## 2022-02-02 MED ORDER — FOLIC ACID 1 MG PO TABS
1.0000 mg | ORAL_TABLET | Freq: Every day | ORAL | Status: DC
  Administered 2022-02-02 – 2022-02-12 (×11): 1 mg via ORAL
  Filled 2022-02-02 (×11): qty 1

## 2022-02-02 MED ORDER — TRAMADOL HCL 50 MG PO TABS
50.0000 mg | ORAL_TABLET | Freq: Two times a day (BID) | ORAL | Status: DC | PRN
  Administered 2022-02-02 – 2022-02-10 (×6): 50 mg via ORAL
  Filled 2022-02-02 (×7): qty 1

## 2022-02-02 MED ORDER — FUROSEMIDE 10 MG/ML IJ SOLN
40.0000 mg | Freq: Once | INTRAMUSCULAR | Status: AC
  Administered 2022-02-02: 40 mg via INTRAVENOUS
  Filled 2022-02-02: qty 4

## 2022-02-02 MED ORDER — FUROSEMIDE 10 MG/ML IJ SOLN
60.0000 mg | Freq: Once | INTRAMUSCULAR | Status: AC
  Administered 2022-02-02: 60 mg via INTRAVENOUS
  Filled 2022-02-02: qty 6

## 2022-02-02 MED ORDER — SPIRONOLACTONE 25 MG PO TABS
25.0000 mg | ORAL_TABLET | Freq: Once | ORAL | Status: AC
  Administered 2022-02-02: 25 mg via ORAL
  Filled 2022-02-02: qty 1

## 2022-02-02 NOTE — Progress Notes (Signed)
PROGRESS NOTE    [name]  AQT:[id number] DOB: [birth year] DOA: 01/12/2022 PCP: [name], MD   Brief Narrative:  56 year old with schizoaffective disorder, depression, polysubstance abuse, COPD, HTN, DM2 for suicide attempt and overdosing on promethazine and alcohol.  Initially patient was intubated due to GCS of 3 complicated by aspiration pneumonia.  Eventually when trach placement on 7/30 and transition to trach collar 7/31.  Other complicated issues that she developed ileus, urinary retention with intermittent fluid overload.  Ongoing trach management by PCCM, GI consulted for colonic ileus.  Current events and procedures 7/23 ED after etoh consumption and intentional promethazine overdose. Intubated. Admit to ICU  7/24 Tachycardic to 130s overnight, also with episode of whole body stiffening, ?dystonia vs. seizure. EEG with irritability, LTM initiated. Neuro consulted. 7/25 Extubated, intermittently somnolent/agitated, not managing secretions well. O2 sats stable on [place]. Cortrak difficult to flush. 7/26 Reintubated early AM for hypoxia, airway protection. Unasyn initiated for ?aspiration PNA. LTM EEG discontinued. MRI pending. 7/27 MRI no acute changes. Wakes up follows commands on .7 dex  7/28 off NE  01/19/2022>> Trach after goals of care discussion with daughter 7/31 Transitioned to ATC.  8/2 CT abdomen: Stool in right colon.  Normal appendix.  Placed gastric bypass surgery.  Nasogastric tube in stomach.  No evidence of wall thickening or obstruction and bowel.  Questionable cystitis.  Bibasilar pleural effusions.  Ceftriaxone started. 01/28/2022.  Transferred to hospitalist service on day 16 of her hospital stay    Assessment & Plan:   Acute hypoxic respiratory failure secondary to aspiration, Status post tracheostomy - Tracheostomy placed due to recurrent intubation.  Has 6 mm cuffed [place].  Routine trach care.  IV Lasix repeated x 2  on 02/02/2022 due to fluid overload,  also antibiotics were commenced by previous team on 01/31/2022.  Monitor.  Continue trach collar, pulmonary toiletry with trach care.  Monitor closely.   Alcohol and promethazine overdose with likely attempted suicide with history of schizoaffective disorder bipolar type, history of polysubstance abuse.  Seen by psych, currently stable continue to monitor.  For now on supportive care.  On as needed Ativan and Haldol.  Psych team following.   ?? History of seizure disorder.  Currently on as needed Ativan.  EEG here nonacute.  Seen by neurology initially was on Keppra this was discontinued by neurology on 01/19/2022.   Colonic dysmotility with obstipation and some intermittent urinary retention.  On bowel regimen with bowel movements now, abdominal x-ray nonacute, some suprapubic fullness, was toward bladder scans were positive for over 300 cc of retention, received Foley catheter.  Start Flomax on 02/01/2022.  Monitor.   Microcytic anemia.  Check anemia panel, type screen.  Received IV iron, placed on ferrous sulfate along with Folex acid, family and patient agreeable for transfusion if needed.   Moderate protein calorie malnutrition.  On oral diet , DW daughter - patient was a small eater to begin with, as patient is developing leukocytosis and recurrent fluid overload I am going to discontinue TNA for the next few days starting on 02/02/2022 and monitor.  Her chances of developing a blood-borne infection is very high.  Discussed with daughter.  She is agreeable with the plan as well.  Continue oral protein supplements.   History of tobacco abuse and COPD.  No acute issues counseled to quit tobacco and alcohol.   GERD.  On PPI.     DM type II.  On long-acting insulin along with sliding scale.  CBG (last  3)  Recent Labs    02/02/22 0402 02/02/22 0430 02/02/22 0700  GLUCAP 48* 78 173*   Lab Results  Component Value Date   HGBA1C 7.4 (H) 11/14/2021     DVT prophylaxis: Subcu heparin Code  Status: Full  Family Communication:    Daughter [name] [phone] on 02/02/2022  Status is: Inpatient Remains inpatient appropriate because: On going eval for fevers and abd pain   Nutritional status  Signs/Symptoms: severe fat depletion, severe muscle depletion  Interventions: Refer to RD note for recommendations, Ensure Enlive (each supplement provides 350kcal and 20 grams of protein), MVI  Body mass index is 19.38 kg/m.     Subjective:   Patient in bed, appears comfortable, denies any headache, no fever, no chest pain or pressure, no shortness of breath , no abdominal pain. No new focal weakness.    Examination:  Awake Alert, No new F.N deficits, tracheostomy in place with trach collar, R. Arm PICC, Foley .AT,PERRAL Supple Neck, No JVD,   Symmetrical Chest wall movement, Good air movement bilaterally, CTAB RRR,No Gallops, Rubs or new Murmurs,  +ve B.Sounds, Abd Soft, No tenderness,   No Cyanosis, Clubbing or edema      Objective: Vitals:   02/02/22 0409 02/02/22 0700 02/02/22 0746 02/02/22 1100  BP: 125/67  124/71 (!) 142/92  Pulse: 93  (!) 116 (!) 122  Resp: (!) 29  (!) 57 (!) 37  Temp: 98.1 F (36.7 C) 98.1 F (36.7 C)    TempSrc: Oral Oral    SpO2: 97%  94% 95%  Weight: 45 kg     Height:        Intake/Output Summary (Last 24 hours) at 02/02/2022 1126 Last data filed at 02/02/2022 0826 Gross per 24 hour  Intake 1252.68 ml  Output 3800 ml  Net -2547.32 ml   Filed Weights   01/31/22 0314 02/01/22 0500 02/02/22 0409  Weight: 43.3 kg 44.9 kg 45 kg     Data Reviewed:   Recent Labs  Lab 01/28/22 0635 01/31/22 0437 02/01/22 0634 02/02/22 0620  WBC 14.2* 20.5* 19.8* 21.1*  HGB 7.3* 7.0* 7.1* 7.2*  HCT 25.1* 23.1* 25.2* 24.0*  PLT 594* 455* 357 322  MCV 70.3* 73.3* 72.8* 71.0*  MCH 20.4* 22.2* 20.5* 21.3*  MCHC 29.1* 30.3 28.2* 30.0  RDW 29.0* 29.7* 29.0* 29.8*  LYMPHSABS  --   --   --  2.6  MONOABS  --   --   --  1.7*  EOSABS  --   --    --  0.0  BASOSABS  --   --   --  0.0    Recent Labs  Lab 01/27/22 0430 01/28/22 0635 01/28/22 1030 01/29/22 0430 01/30/22 0624 01/31/22 0654 01/31/22 1235 01/31/22 1620 02/01/22 0634 02/02/22 0316  NA 132* 133*  --  136 134* 131*  --   --  133* 135  K 4.1 4.2  --  4.4 4.9 4.2  --   --  4.4 4.3  CL 93* 98  --  101 102 101  --   --  104 105  CO2 30 28  --  '29 26 25  '$ --   --  23 24  GLUCOSE 268* 186*  --  88 106* 157*  --   --  162* 66*  BUN 10 17  --  '15 19 13  '$ --   --  12 23*  CREATININE 0.38* 0.32*  --  0.37* 0.48 0.34*  --   --  0.39* 0.42*  CALCIUM 8.4* 8.3*  --  8.4* 8.3* 8.2*  --   --  8.2* 8.5*  AST 56*  --   --   --  56* 37  --   --   --   --   ALT 87*  --   --   --  82* 72*  --   --   --   --   ALKPHOS 89  --   --   --  137* 146*  --   --   --   --   BILITOT 0.2*  --   --   --  0.2* 0.2*  --   --   --   --   ALBUMIN 1.8*  --   --   --  1.6* 1.6*  --   --   --   --   MG 1.8 2.0  --  2.1 2.1 1.8  --   --  1.8 2.2  PHOS 4.5  --   --  4.5 5.0*  --   --   --  3.2  --   PROCALCITON  --  <0.10  --  0.24 0.63  --   --   --  0.45 0.72  LATICACIDVEN  --   --   --   --   --   --  1.3  --   --   --   TSH  --   --  3.043  --   --   --   --   --   --   --   AMMONIA  --   --   --   --   --   --   --  53*  --   --   BNP  --  20.6  --   --   --   --   --   --  131.5* 82.1     Radiology Studies: DG Chest Port 1 View  Result Date: 02/01/2022 CLINICAL DATA:  56 year old female with history of shortness of breath. EXAM: PORTABLE CHEST 1 VIEW COMPARISON:  Chest x-ray 01/31/2022. FINDINGS: A tracheostomy tube is in place with tip 5.7 cm above the carina. There is a right upper extremity PICC with tip terminating in the superior cavoatrial junction. Lung volumes are low. Bibasilar opacities (right greater than left) likely subsegmental atelectasis. Blunting of the right costophrenic sulcus suggestive of a small right pleural effusion. No pneumothorax. Diffuse interstitial prominence and  peribronchial cuffing. No confluent consolidative airspace disease. No evidence of pulmonary edema. Heart size is mildly enlarged. Upper mediastinal contours are within normal limits. IMPRESSION: 1. Support apparatus, as above. 2. Diffuse interstitial prominence and peribronchial cuffing, concerning for an acute bronchitis. 3. Small right pleural effusion. 4. Low lung volumes with bibasilar areas of subsegmental atelectasis. Electronically Signed   By: Vinnie Langton M.D.   On: 02/01/2022 08:18   DG Abd 1 View  Result Date: 02/01/2022 CLINICAL DATA:  56 year old female history of productive cough. Abdominal pain. EXAM: ABDOMEN - 1 VIEW COMPARISON:  01/31/2022. FINDINGS: There continues to be oral contrast material in the colon and rectum, with minimal progression compared to the prior study. No pathologic dilatation of small bowel or colon. No pneumoperitoneum. IMPRESSION: 1. Nonobstructive bowel gas pattern. 2. No pneumoperitoneum. Electronically Signed   By: Vinnie Langton M.D.   On: 02/01/2022 08:17   DG Chest Port 1 View  Result Date: 01/31/2022 CLINICAL DATA:  Shortness of breath.  Chest pain. EXAM: PORTABLE CHEST 1 VIEW COMPARISON:  January 28, 2022 FINDINGS: A right PICC line terminates in the central SVC. Stable cardiomegaly. The tracheostomy tube is stable. The hila and mediastinum are unremarkable. No pneumothorax. No nodules or masses. Mild bibasilar subsegmental atelectasis. Possible tiny effusions. No evidence of pneumonia. No overt edema. IMPRESSION: Possible small effusions with underlying atelectasis. Possible mild pulmonary venous congestion. No focal infiltrate or overt edema. Electronically Signed   By: Dorise Bullion III M.D.   On: 01/31/2022 18:34   DG Abd 1 View  Result Date: 01/31/2022 CLINICAL DATA:  Abdominal pain. Drug overdose age. Follow-up study. EXAM: ABDOMEN - 1 VIEW COMPARISON:  01/29/2022 and older exams.  CT, 01/30/2022. FINDINGS: There is residual contrast outlining  a distended colon, contrast having progressed into the left colon since the prior exam. Colonic distension is without significant change. No small bowel dilation. Soft tissues are poorly defined, grossly unremarkable. IMPRESSION: 1. Findings similar to the recent prior exams. There is colonic distension suggesting a colonic ileus. There has been some progression colonic contrast into the left colon since the previous abdominal radiographs. No evidence of bowel obstruction. Electronically Signed   By: Lajean Manes M.D.   On: 01/31/2022 15:22     Scheduled Meds:   vitamin C  500 mg Oral Daily   bisacodyl  10 mg Rectal BID   Chlorhexidine Gluconate Cloth  6 each Topical Daily   cyanocobalamin  1,000 mcg Intramuscular Q7 days   docusate sodium  100 mg Oral BID   feeding supplement  237 mL Oral TID WC & HS   ferrous sulfate  325 mg Oral BID WC   furosemide  40 mg Intravenous Once   [START ON 02/03/2022] furosemide  40 mg Oral Daily   guaiFENesin  600 mg Oral BID   heparin injection (subcutaneous)  5,000 Units Subcutaneous Q8H   insulin aspart  0-15 Units Subcutaneous Q4H   lactulose  30 g Oral TID   linaclotide  145 mcg Oral QAC breakfast   LORazepam  1 mg Intravenous BID   metoCLOPramide (REGLAN) injection  5 mg Intravenous Q6H   metoprolol tartrate  25 mg Oral Once   metoprolol tartrate  50 mg Oral BID   mouth rinse  15 mL Mouth Rinse 4 times per day   pantoprazole  40 mg Oral QHS   revefenacin  175 mcg Nebulization Daily   simethicone  40 mg Oral TID AC & HS   sodium chloride flush  10-40 mL Intracatheter Q12H   spironolactone  25 mg Oral Once   tamsulosin  0.4 mg Oral Daily   vitamin A  50,000 Units Intramuscular Daily   Followed by   Derrill Memo ON 02/11/2022] vitamin A  10,000 Units Oral Daily   Vitamin D (Ergocalciferol)  50,000 Units Oral Q7 days   Continuous Infusions:  sodium chloride Stopped (01/31/22 1226)   ceFEPime (MAXIPIME) IV 2 g (02/02/22 0051)   metronidazole 500 mg  (02/01/22 2228)   TPN ADULT (ION) 70 mL/hr at 02/01/22 1723   TPN ADULT (ION)       LOS: 21 days   Time spent= 35 mins    Lala Lund, MD [facility]  If 7PM-7AM, please contact night-coverage  02/02/2022, 11:26 AM

## 2022-02-02 NOTE — Progress Notes (Addendum)
PHARMACY - TOTAL PARENTERAL NUTRITION CONSULT NOTE  Indication: Prolonged ileus  Patient Measurements: Height: 5' (152.4 cm) Weight: 45 kg (99 lb 3.3 oz) IBW/kg (Calculated) : 45.5 TPN AdjBW (KG): 42.2 Body mass index is 19.38 kg/m. Usual weight:  240 lbs in 2017, now 88 lbs with significant weight loss over the past 3 yrs  Assessment:  16 YOF with history of polysubstance abuse and bariatric surgery presented on 7/23 s/p intentional overdose.  CT and abd XR showed large amount of stool in the colon and an ileus. She has severe PCM with minimal nutrition since admit (TF from 7/23 until 7/25), so Pharmacy consulted to manage TPN.  Glucose / Insulin: hx DM on metformin PTA.  Hypoglycemic on 8/13 b/c TPN was paused for hygiene.  CBGs improving overall Used 19 units on mSSI, 25 units in TPN  Electrolytes: all WNL (Mag 2.2 post 2gm IV, K 4.3 post 73mq PO with Lasix '100mg'$  IV, CoCa high normal at 10.4) Renal: SCr < 1 stable, BUN 23 - daily PRN Lasix (documented volume overload) Hepatic: LFTs mildly elevated, tbili/TG WNL, albumin 1.6, lipase 145, NH4 53 Vitamin/Mineral panel: Vitamins B1/B12, copper, zinc and folate WNL. Started Vit A, D and C supplementation on 8/5  > Vitamin A 100K units IM daily x3, then 50K units IM daily x14, then 10K PO daily x60  > Vitamin D2 (ergocalciferol) 50,000 units PO once weekly x 8 weeks  > Vitamin C '500mg'$  PO daily x28 days Intake / Output; MIVF: UOP 3.8 ml/kg/hr with Lasix, LBM 8/10 GI Imaging:  8/2 CT: pleural effusions, B/L renal collecting system dilatation with concern of cystitis 8/11 Abx XR: colonic ileus, no SBO 8/12 Abx XR: nonobstructive bowel gas pattern, no pneumoperitoneum  GI Surgeries / Procedures: none since TPN initiation   Central access: PICC placed 01/17/22 TPN start date: 01/18/22  Nutritional Goals: RD Estimated Needs Total Energy Estimated Needs: 1600-1800 kcals Total Protein Estimated Needs: 100-115 g Total Fluid Estimated  Needs: >/= 1.6 L  Current Nutrition:  TPN Dysphagia diet - eating up to 60% of meals Ensure Enlive BID - 4 charted given yesterday 8/11 calorie count:  insufficient PO intake  Plan:  Concentrate TPN with continued Lasix use  TPN at 60 ml/hr will provide 111g AA, 216g CHO and 43g ILE for a total of 1610 kCal, meeting 100% of needs Electrolytes in TPN: Na 1155m/L, K 5549mL, Ca 4mE107m, Mag 10mE51m Phos 6mmol75mon 8/11, Cl:Ac 1:1 - all lytes adjusted given TPN rate change Add standard MVI and trace elements to TPN  D/C daily PO folate and add to TPN Continue moderate SSI Q6H to cover for high CBGs with oral intake (changed from Q4H for pt comfort) Reduce regular insulin in TPN to 20 units with less CHO in TPN Standard TPN labs on Mon and Thurs F/u PO intake to reduce TPN provision, plan for cortrak when possible Monitor for s/sx of infection  [name] D. [name], [name], BCPS, BCCCP [email or web address], 7:29 AM  ===========================  Addendum: Hold TPN with leukocytosis and risk for bacteremia/fungemia per MD Reduce TPN to 35 ml/hr x 2 hrs, then stop to prevent hypoglycemia with insulin in TPN (RN aware) Resume folate Add PO adult multivitamin daily D/C TPN labs and nursing care orders for now Please reconsult Pharmacy if/when needed  [name] D. [name], [name], BCPS, BCCCP [id number], 12:23 PM

## 2022-02-02 NOTE — Progress Notes (Signed)
Patient had been asking the NT and I to "help her" throughout the day. When either of [place] asked what could do to help she refused to answer and just continued to say "help me." At 1700 after she again repeatedly said "help me" and I asked what I could do to help she said "to kill myself." I told her I would not help her kill herself. She then said she would hit her head on the floor to kill herself.  The patient was getting increasingly agitated, i.e. hitting my hand away when I offered her the emesis bag because she said she was feeling sick and shouting when I repeated that I would not help her kill herself when she said "help me" several more times. I gave her 2 mg of Haldol to help her calm down so she could relax.

## 2022-02-02 NOTE — Progress Notes (Signed)
[facility] night cross cover note:   I was notified by RN of the patient's complaint of significant generalized abdominal discomfort, refractory to prn acetaminophen.  RN conveys that Fentanyl has been effective for the patient abd pain over the last few evenings.  Attempting to limit opioid use in order to not exacerbate gut dysmotility issues.  Given no improvement with prn acetaminophen,  I have ordered low-dose fentanyl 25 mcg IV every 2 hours as needed x2 doses.    Babs Bertin, DO Hospitalist

## 2022-02-02 NOTE — Progress Notes (Signed)
Hypoglycemic Event  CBG:  69 @ 0402  Treatment: 4 oz juice/soda  Symptoms: Sweaty  Follow-up CBG: Time: 0430 CBG Result: 78  Possible Reasons for Event: Inadequate meal intake                  extended TPN pause for hygiene   Trellis Moment

## 2022-02-03 DIAGNOSIS — Z430 Encounter for attention to tracheostomy: Secondary | ICD-10-CM

## 2022-02-03 DIAGNOSIS — T50902D Poisoning by unspecified drugs, medicaments and biological substances, intentional self-harm, subsequent encounter: Secondary | ICD-10-CM | POA: Diagnosis not present

## 2022-02-03 LAB — BASIC METABOLIC PANEL
Anion gap: 5 (ref 5–15)
BUN: 19 mg/dL (ref 6–20)
CO2: 26 mmol/L (ref 22–32)
Calcium: 8 mg/dL — ABNORMAL LOW (ref 8.9–10.3)
Chloride: 103 mmol/L (ref 98–111)
Creatinine, Ser: 0.39 mg/dL — ABNORMAL LOW (ref 0.44–1.00)
GFR, Estimated: >60 mL/min (ref >60)
Glucose, Bld: 77 mg/dL (ref 70–99)
Potassium: 4.5 mmol/L (ref 3.5–5.1)
Sodium: 134 mmol/L — ABNORMAL LOW (ref 135–145)

## 2022-02-03 LAB — CBC
HCT: 25.4 % — ABNORMAL LOW (ref 36.0–46.0)
Hemoglobin: 7.7 g/dL — ABNORMAL LOW (ref 12.0–15.0)
MCH: 21.3 pg — ABNORMAL LOW (ref 26.0–34.0)
MCHC: 30.3 g/dL (ref 30.0–36.0)
MCV: 70.4 fL — ABNORMAL LOW (ref 80.0–100.0)
Platelets: 357 10*3/uL (ref 150–400)
RBC: 3.61 MIL/uL — ABNORMAL LOW (ref 3.87–5.11)
RDW: 30.3 % — ABNORMAL HIGH (ref 11.5–15.5)
WBC: 17.6 10*3/uL — ABNORMAL HIGH (ref 4.0–10.5)
nRBC: 0 % (ref 0.0–0.2)

## 2022-02-03 LAB — GLUCOSE, CAPILLARY
Glucose-Capillary: 127 mg/dL — ABNORMAL HIGH (ref 70–99)
Glucose-Capillary: 134 mg/dL — ABNORMAL HIGH (ref 70–99)
Glucose-Capillary: 137 mg/dL — ABNORMAL HIGH (ref 70–99)
Glucose-Capillary: 141 mg/dL — ABNORMAL HIGH (ref 70–99)
Glucose-Capillary: 206 mg/dL — ABNORMAL HIGH (ref 70–99)
Glucose-Capillary: 243 mg/dL — ABNORMAL HIGH (ref 70–99)
Glucose-Capillary: 72 mg/dL (ref 70–99)

## 2022-02-03 LAB — PHOSPHORUS: Phosphorus: 3.4 mg/dL (ref 2.5–4.6)

## 2022-02-03 LAB — BRAIN NATRIURETIC PEPTIDE: B Natriuretic Peptide: 60.4 pg/mL (ref 0.0–100.0)

## 2022-02-03 LAB — PROCALCITONIN: Procalcitonin: 0.54 ng/mL

## 2022-02-03 MED ORDER — MORPHINE SULFATE (PF) 2 MG/ML IV SOLN
0.5000 mg | Freq: Once | INTRAVENOUS | Status: AC
  Administered 2022-02-03: 0.5 mg via INTRAVENOUS
  Filled 2022-02-03: qty 1

## 2022-02-03 MED ORDER — ALTEPLASE 2 MG IJ SOLR
2.0000 mg | Freq: Once | INTRAMUSCULAR | Status: AC
  Administered 2022-02-03: 2 mg
  Filled 2022-02-03: qty 2

## 2022-02-03 MED ORDER — HALOPERIDOL LACTATE 5 MG/ML IJ SOLN
5.0000 mg | Freq: Once | INTRAMUSCULAR | Status: AC
  Administered 2022-02-03: 5 mg via INTRAVENOUS
  Filled 2022-02-03: qty 1

## 2022-02-03 NOTE — Progress Notes (Signed)
Nutrition Follow-up  DOCUMENTATION CODES:   Underweight, Severe malnutrition in context of chronic illness  INTERVENTION:   Discontinue calorie count.  Continue PO supplements:  Magic cup TID with meals, each supplement provides 290 kcal and 9 grams of protein Ensure Plus po TID, each supplement provides 350 kcal and 20 grams of protein.  Encourage intake of meals and supplements.   NUTRITION DIAGNOSIS:   Severe Malnutrition related to chronic illness (hx gastric bypass, COPD, polysubtance abuse) as evidenced by severe fat depletion, severe muscle depletion.  Ongoing   GOAL:   Patient will meet greater than or equal to 90% of their needs  Unmet   MONITOR:   TF tolerance, Vent status, Labs, Weight trends  REASON FOR ASSESSMENT:   Consult, Ventilator Enteral/tube feeding initiation and management  ASSESSMENT:   56 yo female admitted with acute encephalopathy in setting of intentional OD with SI, intubated for airway protection. PMH includes COPD, HTN, DM, schizoaffective disorder, depression. Pt with hx of gastric bypass in 2017, sig weight loss  Calorie Count: 8/10 dinner: 85 kcal, 2 gm protein  No meal tickets saved over the weekend. Patient has been consuming on average 24% of her meals. She is receiving magic cups with meals and being offered Ensure Plus supplements TID. Per RN, patient is drinking the Ensure supplements TID (provides an additional 1050 kcal and 60 gm protein per day).  TPN was discontinued Sunday d/t leukocytosis and risk for bacteremia/fungemia. Per discussion with attending physician, will not resume TPN or place a Cortrak. Will d/c calorie count.   Labs reviewed. Na 134 CBG: [id number]  Medications reviewed and include vitamin C, vitamin B-12, Colace, ferrous sulfate, folic acid, Lasix, Novolog, lactulose, Reglan, MVI with minerals, Flomax, vitamin A, vitamin D.  Weight is trending back down, currently 40.1 kg (lowest weight since  admission).   Diet Order:   Diet Order             DIET DYS 2 Room service appropriate? Yes with Assist; Fluid consistency: Nectar Thick  Diet effective now                   EDUCATION NEEDS:   Not appropriate for education at this time  Skin:  Skin Assessment: Reviewed RN Assessment  Last BM:  8/13 type 6  Height:   Ht Readings from Last 1 Encounters:  01/12/22 5' (1.524 m)    Weight:   Wt Readings from Last 1 Encounters:  02/03/22 40.1 kg    BMI:  Body mass index is 17.28 kg/m.  Estimated Nutritional Needs:   Kcal:  1600-1800 kcals  Protein:  100-115 g  Fluid:  >/= 1.6 L   Lucas Mallow RD, LDN, CNSC Please refer to Amion for contact information.

## 2022-02-03 NOTE — Progress Notes (Addendum)
NAME:  [name], MRN:  [id number], DOB:  05-Jun-1966, LOS: 64 ADMISSION DATE:  01/12/2022, CONSULTATION DATE:  01/12/21 REFERRING MD:  Sedonia Small - EM, CHIEF COMPLAINT:  Intentional Overdose    History of Present Illness:  56 y/o F PMH schizoaffective disorder (bipolar type), Depression, prior SI, polysubstance use disorder, COPD, HTN, DM presented to the ED following suicide attempt. Patient had been drinking, while with friends stated that she was going to kill herself and was witnessed ingesting a bottle of promethazine.   In transport with EMS became more somnolent. Arrived to ED with GCS 3 and was intubated in this setting. EM discussed case with poison control who recommended supportive care  PCCM consulted for admission in this setting.  Pertinent Medical History:  Schizoaffective disorder, bipolar type Depression Polysubstance abuse  COPD HTN DM    Significant Hospital Events: Including procedures, antibiotic start and stop dates in addition to other pertinent events   7/23 ED after etoh consumption and intentional promethazine overdose. Intubated. Admit to ICU  7/24 Tachycardic to 130s overnight, also with episode of whole body stiffening, ?dystonia vs. seizure. EEG with irritability, LTM initiated. Neuro consulted. 7/25 Extubated, intermittently somnolent/agitated, not managing secretions well. O2 sats stable on [place]. Cortrak difficult to flush. 7/26 Reintubated early AM for hypoxia, airway protection. Unasyn initiated for ?aspiration PNA. LTM EEG discontinued. MRI pending. 7/27 MRI no acute changes. Wakes up follows commands on .7 dex  7/28 off NE  01/19/2022>> Trach after goals of care discussion with daughter 7/31 Transitioned to ATC.  8/2 CT abdomen: Stool in right colon.  Normal appendix.  Placed gastric bypass surgery.  Nasogastric tube in stomach.  No evidence of wall thickening or obstruction and bowel.  Questionable cystitis.  Bibasilar pleural effusions.  Ceftriaxone  started.  Interim History / Subjective:  Afebrile  On 8L/35% ATC  Nurse tech reports pt drank milk, cranberry juice but ate little of her breakfast > no difficulty with swallowing. Remains on D2 diet  Daughter reports she is sleepy now after getting am medications  Objective:  Blood pressure (!) 93/58, pulse 84, temperature 98.8 F (37.1 C), temperature source Axillary, resp. rate (!) 23, height 5' (1.524 m), weight 40.1 kg, last menstrual period 01/08/2011, SpO2 96 %.    FiO2 (%):  [35 %-60 %] 40 %   Intake/Output Summary (Last 24 hours) at 02/03/2022 0904 Last data filed at 02/03/2022 3154 Gross per 24 hour  Intake 1020.74 ml  Output 2350 ml  Net -1329.26 ml   Filed Weights   02/01/22 0500 02/02/22 0409 02/03/22 0438  Weight: 44.9 kg 45 kg 40.1 kg   Physical Examination: General: thin adult female lying in bed in NAD, was seen earlier sitting up on side of bed HEENT: MM pink/moist Neuro: sleeping, arouses to voice and smiles then drifts back to sleep CV: s1s2 RRR, no m/r/g PULM: non-labored at rest, lungs bilaterally clear with good air entry, #4 cuffless trach GI: soft, bsx4 active  Extremities: warm/dry, no edema, muscle wasting Skin: no rashes or lesions  Resolved Hospital Problem List:   Hypotension, suspect sedation related   Acute toxic encephalopathy in the setting of drug overdose Hypokalemia  Assessment & Plan:   Acute Hypoxic Respiratory Failure in setting of Overdose, subsequently due to Aspiration s/p Tracheostomy  Tobacco Abuse  COPD w/o Exacerbation  -trach care per protocol -wean O2 for sats 88-94% -may be nearing a point of potential decannulation > however, she is currently too sleepy to assess  at the moment but other indicators are favorable for possible decannulation. She is doing well with diet when she eats and no significant secretion burden.  O2 needs stable.  Working with PT. Will follow closely this week for possible decannulation. -consider  capping trial if patient more awake this afternoon -continue Yupelri  -lasix for negative balance -continue SLP efforts   SIRS  -cefepime, flagyl per TRH.  D4/x abx  -WBC trending down -follow PCT   Acute Toxic Metabolic Encephalopathy secondary to Intentional Overdose  Suicidal Ideation with intentional Promethazine Overdose  Seizure Disorder  MDD  Polysubstance Abuse  -per TR[facility] -IVC in place  Abdominal Pain, Colonic Dysmotility  Severe Protein Calorie Malnutrition  IDA  DM II  -per [facility]  Best Practice (right click and "Reselect all SmartList Selections" daily)  Daughter updated at bedside 8/14 on plan of care.    PCCM will follow up 8/16.    Per [facility]      Noe Gens, MSN, APRN, NP-C, AGACNP-BC [facility] 02/03/2022, 9:04 AM   Please see [email or web address] for pager details.   From 7A-7P if no response, please call [phone] After hours, please call ELink [phone]

## 2022-02-03 NOTE — Progress Notes (Signed)
[facility] night cross cover note:  Pt c/o breakthrough abd pain on prn Tramadol. Per RN, active bowel sounds noted, along with report of liquid bowel movement on 02/02/22. I ordered fentanyl 25 mcg IV q2h prn x 2 doses.    Babs Bertin, DO Hospitalist

## 2022-02-03 NOTE — Progress Notes (Signed)
Speech Language Pathology Treatment: Dysphagia;Passy Muir Speaking valve  Patient Details Name: [name] MRN: [id number] DOB: 10/11/1965 Today's Date: 02/03/2022 Time: 8366-2947 SLP Time Calculation (min) (ACUTE ONLY): 17 min  Assessment / Plan / Recommendation Clinical Impression  Ms. Kopec was lethargic this am after having Ativan.  Sitter at bedside.  PMV cleaned then placed - trach has been downsized to #4 cuffless since last seen by SLP. With tactile/verbal cues, she awakened briefly to answer biographical questions and take some sips of nectar thick liquid.  Voice was still mildly hoarse and hypophonic. VS were stable when using valve.  She consumed liquids with no s/s of aspiration, but could not be sufficiently awakened to participate further. Valve was removed prior to departure and placed in cup at bedside.   Pt should be using valve whenever she eats/drinks.  SLP will continue to follow for speech/swallowing. D/W RN.    HPI HPI: Pt is a 56 y.o. female admitted 01/12/22 after suicide attempt with intentional drug overdose of promethazine. ETT 7/23-7/25; reintubated for hypoxia 7/26- trach 7/30. ATC initiated on 7/31. EEG 7/31: moderate severe diffuse encephalopathy, nonspecific etiology; no seizures. Cortrak placed 7/31. PMH: Afib, polysubstance abuse, COPD, DM, neuropathy, HTN, bipolar disorder, schizophrenia, anxiety, depression, ETOH abuse.      SLP Plan  Continue with current plan of care      Recommendations for follow up therapy are one component of a multi-disciplinary discharge planning process, led by the attending physician.  Recommendations may be updated based on patient status, additional functional criteria and insurance authorization.    Recommendations  Diet recommendations: Dysphagia 2 (fine chop);Nectar-thick liquid Liquids provided via: Cup;Straw Medication Administration: Whole meds with liquid Supervision: Full supervision/cueing for compensatory  strategies Compensations: Slow rate;Small sips/bites Postural Changes and/or Swallow Maneuvers: Out of bed for meals      Patient may use Passy-Muir Speech Valve: Intermittently with supervision PMSV Supervision: Full         Oral Care Recommendations: Oral care BID Follow Up Recommendations: Skilled nursing-short term rehab (<3 hours/day) Assistance recommended at discharge: Frequent or constant Supervision/Assistance SLP Visit Diagnosis: Dysphagia, oropharyngeal phase (R13.12) Plan: Continue with current plan of care          [name] Middlebrooks L. Tivis Ringer, MA CCC/SLP Clinical Specialist - Acute Care SLP Acute Rehabilitation Services Office number [phone]  [name]  02/03/2022, 10:57 AM

## 2022-02-03 NOTE — Progress Notes (Signed)
Mobility Specialist Progress Note    02/03/22 1729  Mobility  Activity Ambulated with assistance in hallway  Level of Assistance Contact guard assist, steadying assist  Assistive Device Front wheel walker  Distance Ambulated (ft) 450 ft  Activity Response Tolerated well  $Mobility charge 1 Mobility   Pre-Mobility: 98 HR During Mobility: 99 HR Post-Mobility: 96 HR  Pt received in chair agreeable and agitated. C/o stomach pain and shouting out. Returned to chair with call bell in reach and sitter present.   Hildred Alamin Mobility Specialist

## 2022-02-03 NOTE — Progress Notes (Signed)
With multiple ambulation along the hallway with the sitter , tolerated well. No farther chest pain presented.

## 2022-02-03 NOTE — Progress Notes (Addendum)
PROGRESS NOTE    [name]  OXB:[id number] DOB: [birth year] DOA: 01/12/2022 PCP: [name], MD   Brief Narrative:  56 year old with schizoaffective disorder, depression, polysubstance abuse, COPD, HTN, DM2 for suicide attempt and overdosing on promethazine and alcohol.  Initially patient was intubated due to GCS of 3 complicated by aspiration pneumonia.  Eventually when trach placement on 7/30 and transition to trach collar 7/31.  Other complicated issues that she developed ileus, urinary retention with intermittent fluid overload.  Ongoing trach management by PCCM, GI consulted for colonic ileus.  Current events and procedures 7/23 ED after etoh consumption and intentional promethazine overdose. Intubated. Admit to ICU  7/24 Tachycardic to 130s overnight, also with episode of whole body stiffening, ?dystonia vs. seizure. EEG with irritability, LTM initiated. Neuro consulted. 7/25 Extubated, intermittently somnolent/agitated, not managing secretions well. O2 sats stable on [place]. Cortrak difficult to flush. 7/26 Reintubated early AM for hypoxia, airway protection. Unasyn initiated for ?aspiration PNA. LTM EEG discontinued. MRI pending. 7/27 MRI no acute changes. Wakes up follows commands on .7 dex  7/28 off NE  01/19/2022>> Trach after goals of care discussion with daughter 7/31 Transitioned to ATC.  8/2 CT abdomen: Stool in right colon.  Normal appendix.  Placed gastric bypass surgery.  Nasogastric tube in stomach.  No evidence of wall thickening or obstruction and bowel.  Questionable cystitis.  Bibasilar pleural effusions.  Ceftriaxone started. 01/28/2022.  Transferred to hospitalist service on day 16 of her hospital stay    Assessment & Plan:   Acute hypoxic respiratory failure secondary to aspiration, Status post tracheostomy - Tracheostomy placed due to recurrent intubation.  Has 6 mm cuffed [place].  Routine trach care.  IV Lasix repeated x 2  on 02/02/2022 due to fluid overload,  also antibiotics were commenced by previous team on 01/31/2022.  Monitor.  Continue trach collar, pulmonary toiletry with trach care.  Monitor closely.  We will request pulmonary to monitor for trach site care and to evaluate if patient is a candidate for decannulation.   Alcohol and promethazine overdose with likely attempted suicide with history of schizoaffective disorder bipolar type, history of polysubstance abuse.  Seen by psych, currently stable continue to monitor.  For now on supportive care.  On as needed Ativan and Haldol.  Psych team following.   ?? History of seizure disorder.  Currently on as needed Ativan.  EEG here nonacute.  Seen by neurology initially was on Keppra this was discontinued by neurology on 01/19/2022.   Colonic dysmotility with obstipation and some intermittent urinary retention.  On bowel regimen with bowel movements now, abdominal x-ray nonacute, some suprapubic fullness, was toward bladder scans were positive for over 300 cc of retention, received Foley catheter.  Start Flomax on 02/01/2022.  Monitor.   Microcytic anemia.  Check anemia panel, type screen.  Received IV iron, placed on ferrous sulfate along with Folex acid, family and patient agreeable for transfusion if needed.   Moderate protein calorie malnutrition.  On oral diet , DW daughter - patient was a small eater to begin with, as patient is developing leukocytosis and recurrent fluid overload I am going to discontinue TNA for the next few days starting on 02/02/2022 and monitor.  Her chances of developing a blood-borne infection is very high.  Discussed with daughter.  She is agreeable with the plan as well.  Continue oral protein supplements.   History of tobacco abuse and COPD.  No acute issues counseled to quit tobacco and alcohol.   GERD.  On PPI.     DM type II.  On long-acting insulin along with sliding scale.  CBG (last 3)  Recent Labs    02/03/22 0313 02/03/22 0536 02/03/22 0724  GLUCAP 72 141*  137*   Lab Results  Component Value Date   HGBA1C 7.4 (H) 11/14/2021     DVT prophylaxis: Subcu heparin Code Status: Full  Family Communication:    Daughter [name] [phone] on 02/02/2022, 02/03/22  Status is: Inpatient Remains inpatient appropriate because: On going eval for fevers and abd pain   Nutritional status  Signs/Symptoms: severe fat depletion, severe muscle depletion  Interventions: Refer to RD note for recommendations, Ensure Enlive (each supplement provides 350kcal and 20 grams of protein), MVI  Body mass index is 17.28 kg/m.    Subjective:   Patient in bed, appears comfortable, denies any headache, no fever, no chest pain or pressure, no shortness of breath , no abdominal pain. No new focal weakness.   Examination:  Awake Alert, No new F.N deficits, tracheostomy in place with trach collar, R. Arm PICC, Foley Comern­o.AT,PERRAL Supple Neck, No JVD,   Symmetrical Chest wall movement, Good air movement bilaterally, CTAB RRR,No Gallops, Rubs or new Murmurs,  +ve B.Sounds, Abd Soft, No tenderness,   No Cyanosis, Clubbing or edema     Objective: Vitals:   02/03/22 0438 02/03/22 0500 02/03/22 0747 02/03/22 0800  BP:  97/60  (!) 93/58  Pulse:  85 84 84  Resp:  20 (!) 24 (!) 23  Temp:      TempSrc:      SpO2:  98% 97% 96%  Weight: 40.1 kg     Height:        Intake/Output Summary (Last 24 hours) at 02/03/2022 0851 Last data filed at 02/03/2022 7681 Gross per 24 hour  Intake 1020.74 ml  Output 2350 ml  Net -1329.26 ml   Filed Weights   02/01/22 0500 02/02/22 0409 02/03/22 0438  Weight: 44.9 kg 45 kg 40.1 kg     Data Reviewed:   Recent Labs  Lab 01/28/22 0635 01/31/22 0437 02/01/22 0634 02/02/22 0620 02/03/22 0614  WBC 14.2* 20.5* 19.8* 21.1* 17.6*  HGB 7.3* 7.0* 7.1* 7.2* 7.7*  HCT 25.1* 23.1* 25.2* 24.0* 25.4*  PLT 594* 455* 357 322 357  MCV 70.3* 73.3* 72.8* 71.0* 70.4*  MCH 20.4* 22.2* 20.5* 21.3* 21.3*  MCHC 29.1* 30.3 28.2* 30.0  30.3  RDW 29.0* 29.7* 29.0* 29.8* 30.3*  LYMPHSABS  --   --   --  2.6  --   MONOABS  --   --   --  1.7*  --   EOSABS  --   --   --  0.0  --   BASOSABS  --   --   --  0.0  --     Recent Labs  Lab 01/28/22 0635 01/28/22 1030 01/29/22 0430 01/30/22 0624 01/31/22 0654 01/31/22 1235 01/31/22 1620 02/01/22 0634 02/02/22 0316 02/03/22 0430  NA 133*  --  136 134* 131*  --   --  133* 135 134*  K 4.2  --  4.4 4.9 4.2  --   --  4.4 4.3 4.5  CL 98  --  101 102 101  --   --  104 105 103  CO2 28  --  '29 26 25  '$ --   --  '23 24 26  '$ GLUCOSE 186*  --  88 106* 157*  --   --  162* 66* 77  BUN 17  --  $'15 19 13  'e$ --   --  12 23* 19  CREATININE 0.32*  --  0.37* 0.48 0.34*  --   --  0.39* 0.42* 0.39*  CALCIUM 8.3*  --  8.4* 8.3* 8.2*  --   --  8.2* 8.5* 8.0*  AST  --   --   --  56* 37  --   --   --   --   --   ALT  --   --   --  82* 72*  --   --   --   --   --   ALKPHOS  --   --   --  137* 146*  --   --   --   --   --   BILITOT  --   --   --  0.2* 0.2*  --   --   --   --   --   ALBUMIN  --   --   --  1.6* 1.6*  --   --   --   --   --   MG 2.0  --  2.1 2.1 1.8  --   --  1.8 2.2  --   PHOS  --   --  4.5 5.0*  --   --   --  3.2  --  3.4  PROCALCITON <0.10  --  0.24 0.63  --   --   --  0.45 0.72 0.54  LATICACIDVEN  --   --   --   --   --  1.3  --   --   --   --   TSH  --  3.043  --   --   --   --   --   --   --   --   AMMONIA  --   --   --   --   --   --  53*  --   --   --   BNP 20.6  --   --   --   --   --   --  131.5* 82.1 60.4     Radiology Studies: No results found.   Scheduled Meds:   vitamin C  500 mg Oral Daily   bisacodyl  10 mg Rectal BID   Chlorhexidine Gluconate Cloth  6 each Topical Daily   cyanocobalamin  1,000 mcg Intramuscular Q7 days   docusate sodium  100 mg Oral BID   feeding supplement  237 mL Oral TID WC & HS   ferrous sulfate  325 mg Oral BID WC   folic acid  1 mg Oral Daily   furosemide  40 mg Oral Daily   guaiFENesin  600 mg Oral BID   heparin injection  (subcutaneous)  5,000 Units Subcutaneous Q8H   insulin aspart  0-15 Units Subcutaneous Q4H   lactulose  30 g Oral TID   linaclotide  145 mcg Oral QAC breakfast   LORazepam  1 mg Intravenous BID   metoCLOPramide (REGLAN) injection  5 mg Intravenous Q6H   metoprolol tartrate  50 mg Oral BID   multivitamin with minerals  1 tablet Oral QHS   mouth rinse  15 mL Mouth Rinse 4 times per day   pantoprazole  40 mg Oral QHS   revefenacin  175 mcg Nebulization Daily   simethicone  40 mg Oral TID AC & HS   sodium chloride flush  10-40 mL Intracatheter Q12H   tamsulosin  0.4  mg Oral Daily   vitamin A  50,000 Units Intramuscular Daily   Followed by   Derrill Memo ON 02/11/2022] vitamin A  10,000 Units Oral Daily   Vitamin D (Ergocalciferol)  50,000 Units Oral Q7 days   Continuous Infusions:  sodium chloride Stopped (01/31/22 1226)   ceFEPime (MAXIPIME) IV Stopped (02/02/22 2310)   metronidazole Stopped (02/02/22 2341)     LOS: 22 days   Time spent= 35 mins    Lala Lund, MD [facility]  If 7PM-7AM, please contact night-coverage  02/03/2022, 8:51 AM

## 2022-02-03 NOTE — Progress Notes (Signed)
Pharmacy Antibiotic Note  [name] is a 56 y.o. female admitted on 01/12/2022.  Pts hospital stay has been complicated by AMS and colonic ileus requiring TPN.   Pharmacy has been consulted for Cefepime dosing.  Plan: Continue Cefepime 2gm IV q12h Flagyl '500mg'$  IV q12h (per MD) Follow-up cx data and clinical progress  Height: 5' (152.4 cm) Weight: 40.1 kg (88 lb 8 oz) IBW/kg (Calculated) : 45.5  Temp (24hrs), Avg:98.6 F (37 C), Min:97.8 F (36.6 C), Max:99.4 F (37.4 C)  Recent Labs  Lab 01/28/22 0635 01/29/22 0430 01/30/22 0624 01/31/22 0437 01/31/22 0654 01/31/22 1235 02/01/22 0634 02/02/22 0316 02/02/22 0620 02/03/22 0430 02/03/22 0614  WBC 14.2*  --   --  20.5*  --   --  19.8*  --  21.1*  --  17.6*  CREATININE 0.32*   < > 0.48  --  0.34*  --  0.39* 0.42*  --  0.39*  --   LATICACIDVEN  --   --   --   --   --  1.3  --   --   --   --   --    < > = values in this interval not displayed.     Estimated Creatinine Clearance: 49.1 mL/min (A) (by C-G formula based on SCr of 0.39 mg/dL (L)).    Allergies  Allergen Reactions   Iron Dextran Shortness Of Breath and Anxiety   Aspirin Nausea And Vomiting and Other (See Comments)    Ok to take tylenol or ibuprofen     Antimicrobials this admission: Cefepime 8/11 >> Flagyl 8/11 >> Rocephin (empiric UTI) 8/2 > 8/6 Unasyn (asp pna) 7/26 > 7/30  Dose adjustments this admission:   Microbiology results: Bcx 8/10: ngtd Sputum 8/10: normal flora 7/23 MRSA PCR +   [name], [name], BCPS, [facility] Clinical Pharmacist  02/03/2022 2:51 PM   [facility] pharmacy phone numbers are listed on [email or web address]

## 2022-02-03 NOTE — Plan of Care (Addendum)
                                      [place]                            [address] [place], [place] [address]      [name] was admitted to the Hospital on 01/12/2022 and is currently in the hospital, she currently does not have the capapcity to make coherent decisions about her medical care, please allow her next of kin Miss [name] DOB: 02/26/1982 her daughter to make these decisions.    Call Lala Lund MD, [facility]  [phone] with questions.  Lala Lund M.D on 02/03/2022,at 12:25 PM  [facility]   Office  [phone]

## 2022-02-03 NOTE — Progress Notes (Signed)
Physical Therapy Treatment Patient Details Name: [name] MRN: [id number] DOB: December 03, 1965 Today's Date: 02/03/2022   History of Present Illness 56 y.o. female admitted 01/12/22 after suicide attempt with intentional promethazine OD. EEG without seizures; showed evidence of epileptogenicity. ETT 7/23-7/25. Reintubated 7/26. Ileus. 7/30 extubated but then trached and returned to vent. PMHx:Afib, COPD, DM, neuropathy, HTN, bipolar disorder, schizophrenia, anxiety, depression, ETOH abuse.    PT Comments    Pt admitted with above diagnosis. Pt was able to ambulate in hallway with min assist as pt is impulsive at times and needs RW and cues for stability. Pt met 4/4 goals and goals revised.  Will continue to follow pt.   Pt currently with functional limitations due to balance and endurance deficits. Pt will benefit from skilled PT to increase their independence and safety with mobility to allow discharge to the venue listed below.      Recommendations for follow up therapy are one component of a multi-disciplinary discharge planning process, led by the attending physician.  Recommendations may be updated based on patient status, additional functional criteria and insurance authorization.  Follow Up Recommendations  Skilled nursing-short term rehab (<3 hours/day) Can patient physically be transported by private vehicle: Yes   Assistance Recommended at Discharge Frequent or constant Supervision/Assistance  Patient can return home with the following Assist for transportation;Help with stairs or ramp for entrance;Assistance with cooking/housework;Direct supervision/assist for medications management;Direct supervision/assist for financial management;A little help with walking and/or transfers;A little help with bathing/dressing/bathroom   Equipment Recommendations  Rolling walker (2 wheels);BSC/3in1    Recommendations for Other Services       Precautions / Restrictions Precautions Precautions:  Fall;Other (comment) Precaution Comments: trach, watch sats Restrictions Weight Bearing Restrictions: No     Mobility  Bed Mobility Overal bed mobility: Needs Assistance Bed Mobility: Supine to Sit, Sit to Sidelying Rolling: Min guard Sidelying to sit: Min guard Supine to sit: Min assist     General bed mobility comments: min guard for safety. pt reaching for theis PTA arm when returning to supine    Transfers Overall transfer level: Needs assistance Equipment used: Rolling walker (2 wheels) Transfers: Sit to/from Stand Sit to Stand: Min guard, Supervision           General transfer comment: min guard to come to standing for safety    Ambulation/Gait Ambulation/Gait assistance: Min assist, Min guard Gait Distance (Feet): 400 Feet Assistive device: Rolling walker (2 wheels) Gait Pattern/deviations: Step-through pattern, Decreased stride length, Trunk flexed   Gait velocity interpretation: 1.31 - 2.62 ft/sec, indicative of limited community ambulator   General Gait Details: SpO2 92-96% with 40% trach collar, Pt was able ambulate with RW with min assist at times due to pts impulsivity and decr safety with ambulation at times. Pt needs min assist for safety.  Attempted to challenge pt with pt needing min guard assist to steady with challenges to balance.   Stairs             Wheelchair Mobility    Modified Rankin (Stroke Patients Only)       Balance Overall balance assessment: Needs assistance Sitting-balance support: No upper extremity supported, Feet supported Sitting balance-Leahy Scale: Good Sitting balance - Comments: sitting without support   Standing balance support: During functional activity, Bilateral upper extremity supported Standing balance-Leahy Scale: Poor Standing balance comment: RW for gait  Cognition Arousal/Alertness: Awake/alert Behavior During Therapy: Flat affect, Impulsive Overall Cognitive  Status: Impaired/Different from baseline Area of Impairment: Safety/judgement, Attention, Following commands, Problem solving                 Orientation Level: Disoriented to, Time Current Attention Level: Sustained Memory: Decreased short-term memory Following Commands: Follows one step commands with increased time Safety/Judgement: Decreased awareness of safety, Decreased awareness of deficits   Problem Solving: Slow processing, Decreased initiation General Comments: pt with continued difficulty following directions with continued impulsivity        Exercises General Exercises - Lower Extremity Long Arc Quad: AROM, Both, 10 reps, Seated (with manual graded resistance) Hip Flexion/Marching: AROM, Right, Left, 20 reps, Seated (with pt counting)    General Comments        Pertinent Vitals/Pain Pain Assessment Pain Assessment: No/denies pain Faces Pain Scale: No hurt Breathing: normal Negative Vocalization: none Facial Expression: smiling or inexpressive Body Language: relaxed Consolability: no need to console PAINAD Score: 0    Home Living                          Prior Function            PT Goals (current goals can now be found in the care plan section) Acute Rehab PT Goals PT Goal Formulation: Patient unable to participate in goal setting Time For Goal Achievement: 02/17/22 Potential to Achieve Goals: Fair Progress towards PT goals: Progressing toward goals    Frequency    Min 3X/week      PT Plan Current plan remains appropriate    Co-evaluation              AM-PAC PT "6 Clicks" Mobility   Outcome Measure  Help needed turning from your back to your side while in a flat bed without using bedrails?: A Little Help needed moving from lying on your back to sitting on the side of a flat bed without using bedrails?: A Little Help needed moving to and from a bed to a chair (including a wheelchair)?: A Little Help needed standing up  from a chair using your arms (e.g., wheelchair or bedside chair)?: A Little Help needed to walk in hospital room?: A Little Help needed climbing 3-5 steps with a railing? : A Lot 6 Click Score: 17    End of Session Equipment Utilized During Treatment: Oxygen;Gait belt Activity Tolerance: Patient tolerated treatment well Patient left: with call bell/phone within reach;with family/visitor present;in chair;with chair alarm set Nurse Communication: Mobility status PT Visit Diagnosis: Other abnormalities of gait and mobility (R26.89);Difficulty in walking, not elsewhere classified (R26.2);Muscle weakness (generalized) (M62.81)     Time: 9574-7340 PT Time Calculation (min) (ACUTE ONLY): 20 min  Charges:  $Gait Training: 8-22 mins                     Babacar Haycraft M,PT Acute Rehab Services [phone]    Alvira Philips 02/03/2022, 2:52 PM

## 2022-02-03 NOTE — Progress Notes (Signed)
Called to patients room due to RN unable to pass catheter and pt desat. Upon my arrival, end tidal CO2 was used and I tried to pass catheter as well. Unable to pass and no color change. RT able to place trach back in using obturator. New end tidal used and positive color change. RN suctioned pt w\hile RT secured trach. Small pink tinged secretions noted and pt has good cough. Pt was titrated back down to 60% from being on 98% and is tolerating well at this time. Will continue to titrate as able.

## 2022-02-03 NOTE — Progress Notes (Signed)
Complained of  throbbing mid sternal chest pain scale of 5. Bp 118/67. MD made aware. Ekg done with no changes from the previous. Morphine  iv given. Continue to monitor.

## 2022-02-04 ENCOUNTER — Inpatient Hospital Stay (HOSPITAL_COMMUNITY): Payer: Medicare Other

## 2022-02-04 DIAGNOSIS — K567 Ileus, unspecified: Secondary | ICD-10-CM | POA: Diagnosis not present

## 2022-02-04 DIAGNOSIS — T50902D Poisoning by unspecified drugs, medicaments and biological substances, intentional self-harm, subsequent encounter: Secondary | ICD-10-CM | POA: Diagnosis not present

## 2022-02-04 LAB — CBC WITH DIFFERENTIAL/PLATELET
Abs Immature Granulocytes: 0.2 10*3/uL — ABNORMAL HIGH (ref 0.00–0.07)
Basophils Absolute: 0 10*3/uL (ref 0.0–0.1)
Basophils Relative: 0 %
Eosinophils Absolute: 0.1 10*3/uL (ref 0.0–0.5)
Eosinophils Relative: 1 %
HCT: 24.9 % — ABNORMAL LOW (ref 36.0–46.0)
Hemoglobin: 7.5 g/dL — ABNORMAL LOW (ref 12.0–15.0)
Immature Granulocytes: 2 %
Lymphocytes Relative: 26 %
Lymphs Abs: 2.5 10*3/uL (ref 0.7–4.0)
MCH: 21.7 pg — ABNORMAL LOW (ref 26.0–34.0)
MCHC: 30.1 g/dL (ref 30.0–36.0)
MCV: 72 fL — ABNORMAL LOW (ref 80.0–100.0)
Monocytes Absolute: 0.7 10*3/uL (ref 0.1–1.0)
Monocytes Relative: 7 %
Neutro Abs: 6.2 10*3/uL (ref 1.7–7.7)
Neutrophils Relative %: 64 %
Platelets: ADEQUATE 10*3/uL (ref 150–400)
RBC: 3.46 MIL/uL — ABNORMAL LOW (ref 3.87–5.11)
RDW: 30.6 % — ABNORMAL HIGH (ref 11.5–15.5)
WBC: 9.7 10*3/uL (ref 4.0–10.5)
nRBC: 0 % (ref 0.0–0.2)

## 2022-02-04 LAB — COMPREHENSIVE METABOLIC PANEL
ALT: 81 U/L — ABNORMAL HIGH (ref 0–44)
AST: 50 U/L — ABNORMAL HIGH (ref 15–41)
Albumin: 1.5 g/dL — ABNORMAL LOW (ref 3.5–5.0)
Alkaline Phosphatase: 142 U/L — ABNORMAL HIGH (ref 38–126)
Anion gap: 5 (ref 5–15)
BUN: 11 mg/dL (ref 6–20)
CO2: 27 mmol/L (ref 22–32)
Calcium: 7.9 mg/dL — ABNORMAL LOW (ref 8.9–10.3)
Chloride: 105 mmol/L (ref 98–111)
Creatinine, Ser: <0.3 mg/dL — ABNORMAL LOW (ref 0.44–1.00)
Glucose, Bld: 113 mg/dL — ABNORMAL HIGH (ref 70–99)
Potassium: 3.8 mmol/L (ref 3.5–5.1)
Sodium: 137 mmol/L (ref 135–145)
Total Bilirubin: 0.2 mg/dL — ABNORMAL LOW (ref 0.3–1.2)
Total Protein: 6.8 g/dL (ref 6.5–8.1)

## 2022-02-04 LAB — PROCALCITONIN: Procalcitonin: 0.2 ng/mL

## 2022-02-04 LAB — CULTURE, BLOOD (ROUTINE X 2)
Culture: NO GROWTH
Culture: NO GROWTH
Special Requests: ADEQUATE
Special Requests: ADEQUATE

## 2022-02-04 LAB — BRAIN NATRIURETIC PEPTIDE: B Natriuretic Peptide: 58.2 pg/mL (ref 0.0–100.0)

## 2022-02-04 LAB — GLUCOSE, CAPILLARY
Glucose-Capillary: 89 mg/dL (ref 70–99)
Glucose-Capillary: 195 mg/dL — ABNORMAL HIGH (ref 70–99)
Glucose-Capillary: 187 mg/dL — ABNORMAL HIGH (ref 70–99)
Glucose-Capillary: 136 mg/dL — ABNORMAL HIGH (ref 70–99)
Glucose-Capillary: 177 mg/dL — ABNORMAL HIGH (ref 70–99)
Glucose-Capillary: 85 mg/dL (ref 70–99)

## 2022-02-04 LAB — MAGNESIUM: Magnesium: 1.8 mg/dL (ref 1.7–2.4)

## 2022-02-04 NOTE — Progress Notes (Signed)
PROGRESS NOTE    [name]  HFW:[id number] DOB: [birth year] DOA: 01/12/2022 PCP: [name], MD   Brief Narrative:  56 year old with schizoaffective disorder, depression, polysubstance abuse, COPD, HTN, DM2 for suicide attempt and overdosing on promethazine and alcohol.  Initially patient was intubated due to GCS of 3 complicated by aspiration pneumonia.  Eventually when trach placement on 7/30 and transition to trach collar 7/31.  Other complicated issues that she developed ileus, urinary retention with intermittent fluid overload.  Ongoing trach management by PCCM, GI consulted for colonic ileus.  Current events and procedures 7/23 ED after etoh consumption and intentional promethazine overdose. Intubated. Admit to ICU  7/24 Tachycardic to 130s overnight, also with episode of whole body stiffening, ?dystonia vs. seizure. EEG with irritability, LTM initiated. Neuro consulted. 7/25 Extubated, intermittently somnolent/agitated, not managing secretions well. O2 sats stable on [place]. Cortrak difficult to flush. 7/26 Reintubated early AM for hypoxia, airway protection. Unasyn initiated for ?aspiration PNA. LTM EEG discontinued. MRI pending. 7/27 MRI no acute changes. Wakes up follows commands on .7 dex  7/28 off NE  01/19/2022>> Trach after goals of care discussion with daughter 7/31 Transitioned to ATC.  8/2 CT abdomen: Stool in right colon.  Normal appendix.  Placed gastric bypass surgery.  Nasogastric tube in stomach.  No evidence of wall thickening or obstruction and bowel.  Questionable cystitis.  Bibasilar pleural effusions.  Ceftriaxone started. 01/28/2022.  Transferred to hospitalist service on day 16 of her hospital stay    Assessment & Plan:   Acute hypoxic respiratory failure secondary to aspiration, Status post tracheostomy - Tracheostomy placed due to recurrent intubation.  Has 6 mm cuffed Shiley.  Routine trach care.  IV Lasix repeated x 2  on 02/02/2022 due to fluid overload,  also antibiotics were commenced by previous team on 01/31/2022.  Monitor.  Continue trach collar, pulmonary toiletry with trach care.  Monitor closely.  We will request pulmonary to monitor for trach site care and to evaluate if patient is a candidate for decannulation.   Alcohol and promethazine overdose with likely attempted suicide with history of schizoaffective disorder bipolar type, history of polysubstance abuse.  Seen by psych, currently stable continue to monitor.  For now on supportive care.  On as needed Ativan and Haldol.  Psych team following.   ?? History of seizure disorder.  Currently on as needed Ativan.  EEG here nonacute.  Seen by neurology initially was on Keppra this was discontinued by neurology on 01/19/2022.   Colonic dysmotility with obstipation and some intermittent urinary retention.  On bowel regimen with bowel movements now, abdominal x-ray nonacute, some suprapubic fullness, was toward bladder scans were positive for over 300 cc of retention, received Foley catheter.  Start Flomax on 02/01/2022.  Monitor.   Microcytic anemia.  Check anemia panel, type screen.  Received IV iron, placed on ferrous sulfate along with Folex acid, family and patient agreeable for transfusion if needed.   Moderate protein calorie malnutrition.  On oral diet , DW daughter - patient was a small eater to begin with, as patient is developing leukocytosis and recurrent fluid overload I am going to discontinue TNA for the next few days starting on 02/02/2022 and monitor.  Her chances of developing a blood-borne infection is very high.  Discussed with daughter.  She is agreeable with the plan as well.  Continue oral protein supplements.   History of tobacco abuse and COPD.  No acute issues counseled to quit tobacco and alcohol.   GERD.  On PPI.     DM type II.  On long-acting insulin along with sliding scale.  CBG (last 3)  Recent Labs    02/03/22 2342 02/04/22 0335 02/04/22 0857  GLUCAP 127* 89  195*   Lab Results  Component Value Date   HGBA1C 7.4 (H) 11/14/2021     DVT prophylaxis: Subcu heparin Code Status: Full  Family Communication:    Daughter [name] [phone] on 02/02/2022, 02/03/22  Status is: Inpatient Remains inpatient appropriate because: On going eval for fevers and abd pain   Nutritional status  Signs/Symptoms: severe fat depletion, severe muscle depletion  Interventions: Refer to RD note for recommendations, Ensure Enlive (each supplement provides 350kcal and 20 grams of protein), MVI  Body mass index is 17.28 kg/m.    Subjective:   Patient in bed, comfortably, denies any headache chest or abdominal pain.  Quite calm today.   Examination:  Awake Alert, No new F.N deficits, tracheostomy in place with trach collar, R. Arm PICC, Foley [place].AT,PERRAL Supple Neck, No JVD,   Symmetrical Chest wall movement, Good air movement bilaterally, CTAB RRR,No Gallops, Rubs or new Murmurs,  +ve B.Sounds, Abd Soft, No tenderness,   No Cyanosis, Clubbing or edema    Objective: Vitals:   02/04/22 0345 02/04/22 0702 02/04/22 0841 02/04/22 1036  BP:   111/73   Pulse: 76 85  98  Resp: (!) 25 (!) 22 20 (!) 24  Temp:      TempSrc:      SpO2: 99% 97%  94%  Weight:      Height:        Intake/Output Summary (Last 24 hours) at 02/04/2022 1138 Last data filed at 02/04/2022 0721 Gross per 24 hour  Intake 595.28 ml  Output 1950 ml  Net -1354.72 ml   Filed Weights   02/01/22 0500 02/02/22 0409 02/03/22 0438  Weight: 44.9 kg 45 kg 40.1 kg     Data Reviewed:   Recent Labs  Lab 01/31/22 0437 02/01/22 0634 02/02/22 0620 02/03/22 0614 02/04/22 0500  WBC 20.5* 19.8* 21.1* 17.6* 9.7  HGB 7.0* 7.1* 7.2* 7.7* 7.5*  HCT 23.1* 25.2* 24.0* 25.4* 24.9*  PLT 455* 357 322 357 PLATELET CLUMPS NOTED ON SMEAR, COUNT APPEARS ADEQUATE  MCV 73.3* 72.8* 71.0* 70.4* 72.0*  MCH 22.2* 20.5* 21.3* 21.3* 21.7*  MCHC 30.3 28.2* 30.0 30.3 30.1  RDW 29.7* 29.0* 29.8* 30.3*  30.6*  LYMPHSABS  --   --  2.6  --  2.5  MONOABS  --   --  1.7*  --  0.7  EOSABS  --   --  0.0  --  0.1  BASOSABS  --   --  0.0  --  0.0    Recent Labs  Lab 01/29/22 0430 01/30/22 0624 01/31/22 0654 01/31/22 1235 01/31/22 1620 02/01/22 0634 02/02/22 0316 02/03/22 0430 02/04/22 0500  NA 136 134* 131*  --   --  133* 135 134* 137  K 4.4 4.9 4.2  --   --  4.4 4.3 4.5 3.8  CL 101 102 101  --   --  104 105 103 105  CO2 '29 26 25  '$ --   --  '23 24 26 27  '$ GLUCOSE 88 106* 157*  --   --  162* 66* 77 113*  BUN '15 19 13  '$ --   --  12 23* 19 11  CREATININE 0.37* 0.48 0.34*  --   --  0.39* 0.42* 0.39* <0.30*  CALCIUM 8.4* 8.3* 8.2*  --   --  8.2* 8.5* 8.0* 7.9*  AST  --  56* 37  --   --   --   --   --  50*  ALT  --  82* 72*  --   --   --   --   --  81*  ALKPHOS  --  137* 146*  --   --   --   --   --  142*  BILITOT  --  0.2* 0.2*  --   --   --   --   --  0.2*  ALBUMIN  --  1.6* 1.6*  --   --   --   --   --  1.5*  MG 2.1 2.1 1.8  --   --  1.8 2.2  --  1.8  PHOS 4.5 5.0*  --   --   --  3.2  --  3.4  --   PROCALCITON 0.24 0.63  --   --   --  0.45 0.72 0.54 0.20  LATICACIDVEN  --   --   --  1.3  --   --   --   --   --   AMMONIA  --   --   --   --  53*  --   --   --   --   BNP  --   --   --   --   --  131.5* 82.1 60.4 58.2     Radiology Studies: DG Abd Portable 1V  Result Date: 02/04/2022 CLINICAL DATA:  Shortness of breath.  Colonic ileus. EXAM: PORTABLE ABDOMEN - 1 VIEW COMPARISON:  02/01/2022 FINDINGS: Large stool volume again noted. While contrast material seen in the right and transverse colon has progressed somewhat, there is persistent opacified stool in the left colon and rectum. No substantial gaseous small bowel dilatation. Bones are diffusely demineralized. IMPRESSION: Persistent large stool volume. Imaging features compatible with clinical constipation. Electronically Signed   By: Misty Stanley M.D.   On: 02/04/2022 07:31   DG Chest Port 1 View  Result Date: 02/04/2022 CLINICAL  DATA:  Shortness of breath.  Drug overdose. EXAM: PORTABLE CHEST 1 VIEW COMPARISON:  02/01/2022 FINDINGS: 0638 hours. Tracheostomy tube again noted. Right PICC line tip overlies the distal SVC level near the junction with the RA. The cardio pericardial silhouette is enlarged. Interstitial markings are diffusely coarsened with chronic features. Minimal basilar atelectasis bilaterally. Telemetry leads overlie the chest. IMPRESSION: Stable. Cardiomegaly with chronic interstitial coarsening and minimal basilar atelectasis. Electronically Signed   By: Misty Stanley M.D.   On: 02/04/2022 07:29     Scheduled Meds:   vitamin C  500 mg Oral Daily   bisacodyl  10 mg Rectal BID   Chlorhexidine Gluconate Cloth  6 each Topical Daily   cyanocobalamin  1,000 mcg Intramuscular Q7 days   docusate sodium  100 mg Oral BID   feeding supplement  237 mL Oral TID WC & HS   ferrous sulfate  325 mg Oral BID WC   folic acid  1 mg Oral Daily   furosemide  40 mg Oral Daily   guaiFENesin  600 mg Oral BID   heparin injection (subcutaneous)  5,000 Units Subcutaneous Q8H   insulin aspart  0-15 Units Subcutaneous Q4H   lactulose  30 g Oral TID   linaclotide  145 mcg Oral QAC breakfast   LORazepam  1 mg Intravenous BID   metoCLOPramide (REGLAN) injection  5 mg Intravenous Q6H   metoprolol  tartrate  50 mg Oral BID   multivitamin with minerals  1 tablet Oral QHS   mouth rinse  15 mL Mouth Rinse 4 times per day   pantoprazole  40 mg Oral QHS   revefenacin  175 mcg Nebulization Daily   simethicone  40 mg Oral TID AC & HS   sodium chloride flush  10-40 mL Intracatheter Q12H   tamsulosin  0.4 mg Oral Daily   vitamin A  50,000 Units Intramuscular Daily   Followed by   Derrill Memo ON 02/11/2022] vitamin A  10,000 Units Oral Daily   Vitamin D (Ergocalciferol)  50,000 Units Oral Q7 days   Continuous Infusions:  sodium chloride Stopped (01/31/22 1226)   ceFEPime (MAXIPIME) IV 2 g (02/04/22 0914)   metronidazole 500 mg (02/04/22  0916)     LOS: 23 days   Time spent= 35 mins    Lala Lund, MD [facility]  If 7PM-7AM, please contact night-coverage  02/04/2022, 11:38 AM

## 2022-02-04 NOTE — Progress Notes (Signed)
Mobility Specialist - Progress Note   02/04/22 1621  Mobility  Activity Ambulated with assistance in hallway  Level of Assistance Contact guard assist, steadying assist  Assistive Device Front wheel walker  Distance Ambulated (ft) 560 ft (400+160)  Activity Response Tolerated well  $Mobility charge 1 Mobility    Pt was received in bed and agreeable to mobility. Pt was slightly impulsive and needed multiple verbal cues. Ambulated with 8 L/min on trach collar. Stopped x1 for seated break due to fatigue. Pt was returned back to room with all needs met and sitters in the room.   Larey Seat

## 2022-02-05 DIAGNOSIS — K567 Ileus, unspecified: Secondary | ICD-10-CM | POA: Diagnosis not present

## 2022-02-05 DIAGNOSIS — T50902D Poisoning by unspecified drugs, medicaments and biological substances, intentional self-harm, subsequent encounter: Secondary | ICD-10-CM | POA: Diagnosis not present

## 2022-02-05 LAB — COMPREHENSIVE METABOLIC PANEL
ALT: 70 U/L — ABNORMAL HIGH (ref 0–44)
AST: 36 U/L (ref 15–41)
Albumin: 1.6 g/dL — ABNORMAL LOW (ref 3.5–5.0)
Alkaline Phosphatase: 132 U/L — ABNORMAL HIGH (ref 38–126)
Anion gap: 6 (ref 5–15)
BUN: 6 mg/dL (ref 6–20)
CO2: 26 mmol/L (ref 22–32)
Calcium: 7.8 mg/dL — ABNORMAL LOW (ref 8.9–10.3)
Chloride: 102 mmol/L (ref 98–111)
Creatinine, Ser: 0.32 mg/dL — ABNORMAL LOW (ref 0.44–1.00)
GFR, Estimated: >60 mL/min (ref >60)
Glucose, Bld: 130 mg/dL — ABNORMAL HIGH (ref 70–99)
Potassium: 3.8 mmol/L (ref 3.5–5.1)
Sodium: 134 mmol/L — ABNORMAL LOW (ref 135–145)
Total Bilirubin: 0.5 mg/dL (ref 0.3–1.2)
Total Protein: 6.8 g/dL (ref 6.5–8.1)

## 2022-02-05 LAB — PATHOLOGIST SMEAR REVIEW

## 2022-02-05 LAB — CBC WITH DIFFERENTIAL/PLATELET
Abs Immature Granulocytes: 0.12 10*3/uL — ABNORMAL HIGH (ref 0.00–0.07)
Basophils Absolute: 0.1 10*3/uL (ref 0.0–0.1)
Basophils Relative: 1 %
Eosinophils Absolute: 0.1 10*3/uL (ref 0.0–0.5)
Eosinophils Relative: 1 %
HCT: 27.4 % — ABNORMAL LOW (ref 36.0–46.0)
Hemoglobin: 8.2 g/dL — ABNORMAL LOW (ref 12.0–15.0)
Immature Granulocytes: 1 %
Lymphocytes Relative: 25 %
Lymphs Abs: 2.4 10*3/uL (ref 0.7–4.0)
MCH: 22.1 pg — ABNORMAL LOW (ref 26.0–34.0)
MCHC: 29.9 g/dL — ABNORMAL LOW (ref 30.0–36.0)
MCV: 73.9 fL — ABNORMAL LOW (ref 80.0–100.0)
Monocytes Absolute: 0.6 10*3/uL (ref 0.1–1.0)
Monocytes Relative: 7 %
Neutro Abs: 6.3 10*3/uL (ref 1.7–7.7)
Neutrophils Relative %: 65 %
Platelets: 344 10*3/uL (ref 150–400)
RBC: 3.71 MIL/uL — ABNORMAL LOW (ref 3.87–5.11)
RDW: 31.2 % — ABNORMAL HIGH (ref 11.5–15.5)
WBC: 9.6 10*3/uL (ref 4.0–10.5)
nRBC: 0 % (ref 0.0–0.2)

## 2022-02-05 LAB — GLUCOSE, CAPILLARY
Glucose-Capillary: 112 mg/dL — ABNORMAL HIGH (ref 70–99)
Glucose-Capillary: 125 mg/dL — ABNORMAL HIGH (ref 70–99)
Glucose-Capillary: 163 mg/dL — ABNORMAL HIGH (ref 70–99)
Glucose-Capillary: 172 mg/dL — ABNORMAL HIGH (ref 70–99)
Glucose-Capillary: 244 mg/dL — ABNORMAL HIGH (ref 70–99)

## 2022-02-05 LAB — PROCALCITONIN: Procalcitonin: 0.12 ng/mL

## 2022-02-05 LAB — MAGNESIUM: Magnesium: 1.6 mg/dL — ABNORMAL LOW (ref 1.7–2.4)

## 2022-02-05 LAB — BRAIN NATRIURETIC PEPTIDE: B Natriuretic Peptide: 74 pg/mL (ref 0.0–100.0)

## 2022-02-05 MED ORDER — LACTATED RINGERS IV SOLN: INTRAVENOUS | Status: AC

## 2022-02-05 MED ORDER — METOPROLOL TARTRATE 25 MG PO TABS
25.0000 mg | ORAL_TABLET | Freq: Two times a day (BID) | ORAL | Status: DC
  Administered 2022-02-05 – 2022-02-10 (×11): 25 mg via ORAL
  Filled 2022-02-05 (×11): qty 1

## 2022-02-05 MED ORDER — MAGNESIUM SULFATE 4 GM/100ML IV SOLN
4.0000 g | Freq: Once | INTRAVENOUS | Status: AC
Start: 2022-02-05 — End: 2022-02-05
  Administered 2022-02-05: 4 g via INTRAVENOUS
  Filled 2022-02-05: qty 100

## 2022-02-05 MED ORDER — ARIPIPRAZOLE 2 MG PO TABS
2.0000 mg | ORAL_TABLET | Freq: Every day | ORAL | Status: AC
  Administered 2022-02-05 – 2022-02-06 (×2): 2 mg via ORAL
  Filled 2022-02-05 (×2): qty 1

## 2022-02-05 MED ORDER — HALOPERIDOL LACTATE 5 MG/ML IJ SOLN
5.0000 mg | Freq: Once | INTRAMUSCULAR | Status: AC
Start: 2022-02-05 — End: 2022-02-05
  Administered 2022-02-05: 5 mg via INTRAMUSCULAR
  Filled 2022-02-05: qty 1

## 2022-02-05 NOTE — Progress Notes (Signed)
Mobility Specialist Progress Note    02/05/22 1124  Mobility  Activity Ambulated with assistance in hallway  Level of Assistance Contact guard assist, steadying assist  Assistive Device Front wheel walker  Distance Ambulated (ft) 450 ft (110+340)  Activity Response Tolerated well  $Mobility charge 1 Mobility   Pt received in bed and agreeable. No complaints on walk. Took x1 seated rest break for a drink. Returned to sitting EOB with call bell in reach.    Hildred Alamin Mobility Specialist

## 2022-02-05 NOTE — Progress Notes (Signed)
PROGRESS NOTE    [name]  HFW:[id number] DOB: 1966/04/16 DOA: 01/12/2022 PCP: [name], MD   Brief Narrative:  56 year old with schizoaffective disorder, depression, polysubstance abuse, COPD, HTN, DM2 for suicide attempt and overdosing on promethazine and alcohol.  Initially patient was intubated due to GCS of 3 complicated by aspiration pneumonia.  Eventually when trach placement on 7/30 and transition to trach collar 7/31.  Other complicated issues that she developed ileus, urinary retention with intermittent fluid overload.  Ongoing trach management by PCCM, GI consulted for colonic ileus.  Current events and procedures 7/23 ED after etoh consumption and intentional promethazine overdose. Intubated. Admit to ICU  7/24 Tachycardic to 130s overnight, also with episode of whole body stiffening, ?dystonia vs. seizure. EEG with irritability, LTM initiated. Neuro consulted. 7/25 Extubated, intermittently somnolent/agitated, not managing secretions well. O2 sats stable on [place]. Cortrak difficult to flush. 7/26 Reintubated early AM for hypoxia, airway protection. Unasyn initiated for ?aspiration PNA. LTM EEG discontinued. MRI pending. 7/27 MRI no acute changes. Wakes up follows commands on .7 dex  7/28 off NE  01/19/2022>> Trach after goals of care discussion with daughter 7/31 Transitioned to ATC.  8/2 CT abdomen: Stool in right colon.  Normal appendix.  Placed gastric bypass surgery.  Nasogastric tube in stomach.  No evidence of wall thickening or obstruction and bowel.  Questionable cystitis.  Bibasilar pleural effusions.  Ceftriaxone started. 01/28/2022.  Transferred to hospitalist service on day 16 of her hospital stay    Assessment & Plan:   Acute hypoxic respiratory failure secondary to aspiration, Status post tracheostomy - Tracheostomy placed due to recurrent intubation.  Has 6 mm cuffed Shiley.  Routine trach care.  IV Lasix repeated x 2  on 02/02/2022 due to fluid overload,  also antibiotics were commenced by previous team on 01/31/2022.  Monitor.  Continue trach collar, pulmonary toiletry with trach care.  Monitor closely.  We will request pulmonary to monitor for trach site care and to evaluate if patient is a candidate for decannulation.   Alcohol and promethazine overdose with likely attempted suicide with history of schizoaffective disorder bipolar type, history of polysubstance abuse.  Seen by psych, currently stable continue to monitor.  For now on supportive care.  On as needed Ativan and Haldol.  Psych team quested to monitor daily to manage extreme anxiety and psych issues.   ?? History of seizure disorder.  Currently on as needed Ativan.  EEG here nonacute.  Seen by neurology initially was on Keppra this was discontinued by neurology on 01/19/2022.   Colonic dysmotility with obstipation and some intermittent urinary retention.  On bowel regimen with bowel movements now, abdominal x-ray nonacute, some suprapubic fullness, was toward bladder scans were positive for over 300 cc of retention, received Foley catheter.  Start Flomax on 02/01/2022.  Monitor.   Microcytic anemia.  Check anemia panel, type screen.  Received IV iron, placed on ferrous sulfate along with Folex acid, family and patient agreeable for transfusion if needed.   Moderate protein calorie malnutrition.  On oral diet , DW daughter - patient was a small eater to begin with, as patient is developing leukocytosis and recurrent fluid overload I am going to discontinue TNA for the next few days starting on 02/02/2022 and monitor.  Her chances of developing a blood-borne infection is very high.  Discussed with daughter.  She is agreeable with the plan as well.  Continue oral protein supplements.   History of tobacco abuse and COPD.  No acute  issues counseled to quit tobacco and alcohol.   GERD.  On PPI.     DM type II.  On long-acting insulin along with sliding scale.  CBG (last 3)  Recent Labs     02/04/22 2012 02/04/22 2326 02/05/22 0416  GLUCAP 177* 85 112*   Lab Results  Component Value Date   HGBA1C 7.4 (H) 11/14/2021     DVT prophylaxis: Subcu heparin Code Status: Full  Family Communication:    Daughter [name] [phone] on 02/02/2022, 02/03/22  Status is: Inpatient Remains inpatient appropriate because: On going eval for fevers and abd pain   Nutritional status  Signs/Symptoms: severe fat depletion, severe muscle depletion  Interventions: Refer to RD note for recommendations, Ensure Enlive (each supplement provides 350kcal and 20 grams of protein), MVI  Body mass index is 17.28 kg/m.    Subjective:   Patient in bed, comfortably, denies any headache chest or abdominal pain.  Quite calm today.   Examination:  Awake Alert, No new F.N deficits, tracheostomy in place with trach collar, R. Arm PICC, Foley [place].AT,PERRAL Supple Neck, No JVD,   Symmetrical Chest wall movement, Good air movement bilaterally, CTAB RRR,No Gallops, Rubs or new Murmurs,  +ve B.Sounds, Abd Soft, No tenderness,   No Cyanosis, Clubbing or edema    Objective: Vitals:   02/05/22 0300 02/05/22 0304 02/05/22 0410 02/05/22 0700  BP: 133/66  114/76   Pulse:  81 78 78  Resp: (!) 27 (!) 24 (!) 21   Temp:   97.7 F (36.5 C)   TempSrc: Oral  Axillary   SpO2:  97%  97%  Weight:      Height:        Intake/Output Summary (Last 24 hours) at 02/05/2022 0859 Last data filed at 02/05/2022 0730 Gross per 24 hour  Intake 360 ml  Output 2750 ml  Net -2390 ml   Filed Weights   02/01/22 0500 02/02/22 0409 02/03/22 0438  Weight: 44.9 kg 45 kg 40.1 kg     Data Reviewed:   Recent Labs  Lab 02/01/22 0634 02/02/22 0620 02/03/22 0614 02/04/22 0500 02/05/22 0706  WBC 19.8* 21.1* 17.6* 9.7 9.6  HGB 7.1* 7.2* 7.7* 7.5* 8.2*  HCT 25.2* 24.0* 25.4* 24.9* 27.4*  PLT 357 322 357 PLATELET CLUMPS NOTED ON SMEAR, COUNT APPEARS ADEQUATE 344  MCV 72.8* 71.0* 70.4* 72.0* 73.9*  MCH 20.5*  21.3* 21.3* 21.7* 22.1*  MCHC 28.2* 30.0 30.3 30.1 29.9*  RDW 29.0* 29.8* 30.3* 30.6* 31.2*  LYMPHSABS  --  2.6  --  2.5 PENDING  MONOABS  --  1.7*  --  0.7 PENDING  EOSABS  --  0.0  --  0.1 PENDING  BASOSABS  --  0.0  --  0.0 PENDING    Recent Labs  Lab 01/30/22 0624 01/31/22 0654 01/31/22 1235 01/31/22 1620 02/01/22 0634 02/02/22 0316 02/03/22 0430 02/04/22 0500 02/05/22 0706  NA 134* 131*  --   --  133* 135 134* 137 134*  K 4.9 4.2  --   --  4.4 4.3 4.5 3.8 3.8  CL 102 101  --   --  104 105 103 105 102  CO2 26 25  --   --  '23 24 26 27 26  '$ GLUCOSE 106* 157*  --   --  162* 66* 77 113* 130*  BUN 19 13  --   --  12 23* '19 11 6  '$ CREATININE 0.48 0.34*  --   --  0.39* 0.42* 0.39* <0.30*  0.32*  CALCIUM 8.3* 8.2*  --   --  8.2* 8.5* 8.0* 7.9* 7.8*  AST 56* 37  --   --   --   --   --  50* 36  ALT 82* 72*  --   --   --   --   --  81* 70*  ALKPHOS 137* 146*  --   --   --   --   --  142* 132*  BILITOT 0.2* 0.2*  --   --   --   --   --  0.2* 0.5  ALBUMIN 1.6* 1.6*  --   --   --   --   --  1.5* 1.6*  MG 2.1 1.8  --   --  1.8 2.2  --  1.8 1.6*  PHOS 5.0*  --   --   --  3.2  --  3.4  --   --   PROCALCITON 0.63  --   --   --  0.45 0.72 0.54 0.20 0.12  LATICACIDVEN  --   --  1.3  --   --   --   --   --   --   AMMONIA  --   --   --  53*  --   --   --   --   --   BNP  --   --   --   --  131.5* 82.1 60.4 58.2  --      Radiology Studies: DG Abd Portable 1V  Result Date: 02/04/2022 CLINICAL DATA:  Shortness of breath.  Colonic ileus. EXAM: PORTABLE ABDOMEN - 1 VIEW COMPARISON:  02/01/2022 FINDINGS: Large stool volume again noted. While contrast material seen in the right and transverse colon has progressed somewhat, there is persistent opacified stool in the left colon and rectum. No substantial gaseous small bowel dilatation. Bones are diffusely demineralized. IMPRESSION: Persistent large stool volume. Imaging features compatible with clinical constipation. Electronically Signed   By: Misty Stanley M.D.   On: 02/04/2022 07:31   DG Chest Port 1 View  Result Date: 02/04/2022 CLINICAL DATA:  Shortness of breath.  Drug overdose. EXAM: PORTABLE CHEST 1 VIEW COMPARISON:  02/01/2022 FINDINGS: 0638 hours. Tracheostomy tube again noted. Right PICC line tip overlies the distal SVC level near the junction with the RA. The cardio pericardial silhouette is enlarged. Interstitial markings are diffusely coarsened with chronic features. Minimal basilar atelectasis bilaterally. Telemetry leads overlie the chest. IMPRESSION: Stable. Cardiomegaly with chronic interstitial coarsening and minimal basilar atelectasis. Electronically Signed   By: Misty Stanley M.D.   On: 02/04/2022 07:29     Scheduled Meds:   vitamin C  500 mg Oral Daily   bisacodyl  10 mg Rectal BID   Chlorhexidine Gluconate Cloth  6 each Topical Daily   cyanocobalamin  1,000 mcg Intramuscular Q7 days   docusate sodium  100 mg Oral BID   feeding supplement  237 mL Oral TID WC & HS   ferrous sulfate  325 mg Oral BID WC   folic acid  1 mg Oral Daily   furosemide  40 mg Oral Daily   guaiFENesin  600 mg Oral BID   heparin injection (subcutaneous)  5,000 Units Subcutaneous Q8H   insulin aspart  0-15 Units Subcutaneous Q4H   lactulose  30 g Oral TID   linaclotide  145 mcg Oral QAC breakfast   LORazepam  1 mg Intravenous BID   metoCLOPramide (REGLAN) injection  5 mg Intravenous  Q6H   metoprolol tartrate  25 mg Oral BID   multivitamin with minerals  1 tablet Oral QHS   mouth rinse  15 mL Mouth Rinse 4 times per day   pantoprazole  40 mg Oral QHS   revefenacin  175 mcg Nebulization Daily   simethicone  40 mg Oral TID AC & HS   sodium chloride flush  10-40 mL Intracatheter Q12H   tamsulosin  0.4 mg Oral Daily   vitamin A  50,000 Units Intramuscular Daily   Followed by   Derrill Memo ON 02/11/2022] vitamin A  10,000 Units Oral Daily   Vitamin D (Ergocalciferol)  50,000 Units Oral Q7 days   Continuous Infusions:  sodium chloride Stopped  (01/31/22 1226)   ceFEPime (MAXIPIME) IV 2 g (02/04/22 2206)   metronidazole 500 mg (02/04/22 2210)     LOS: 24 days   Time spent= 35 mins    Lala Lund, MD [facility]  If 7PM-7AM, please contact night-coverage  02/05/2022, 8:59 AM

## 2022-02-05 NOTE — Progress Notes (Signed)
Still refused to have PICC line dressing changed at this time.

## 2022-02-05 NOTE — Progress Notes (Signed)
Physical Therapy Treatment Patient Details Name: [name] MRN: [id number] DOB: December 24, 1965 Today's Date: 02/05/2022   History of Present Illness 56 y.o. female admitted 01/12/22 after suicide attempt with intentional promethazine OD. EEG without seizures; showed evidence of epileptogenicity. ETT 7/23-7/25. Reintubated 7/26. Ileus. 7/30 extubated but then trached and returned to vent. PMHx:Afib, COPD, DM, neuropathy, HTN, bipolar disorder, schizophrenia, anxiety, depression, ETOH abuse.    PT Comments    Pt continues to progress with mobility well but requiring frequent cues for safety and on 35% O2 trach collar.  Due to cognition, impulsivity, O2 needs, and decreased safety awareness-continue to recommend SNF. Continue to progress as able.    Recommendations for follow up therapy are one component of a multi-disciplinary discharge planning process, led by the attending physician.  Recommendations may be updated based on patient status, additional functional criteria and insurance authorization.  Follow Up Recommendations  Skilled nursing-short term rehab (<3 hours/day) Can patient physically be transported by private vehicle: Yes   Assistance Recommended at Discharge Frequent or constant Supervision/Assistance  Patient can return home with the following Assist for transportation;Help with stairs or ramp for entrance;Assistance with cooking/housework;Direct supervision/assist for medications management;Direct supervision/assist for financial management;A little help with walking and/or transfers;A little help with bathing/dressing/bathroom   Equipment Recommendations  Rolling walker (2 wheels);BSC/3in1    Recommendations for Other Services       Precautions / Restrictions Precautions Precautions: Fall;Other (comment) Precaution Comments: trach, watch sats     Mobility  Bed Mobility Overal bed mobility: Needs Assistance Bed Mobility: Sit to Supine     Supine to sit: Min  assist     General bed mobility comments: Sitting at arrival ; mod cues for positioning prior to return to bed    Transfers Overall transfer level: Needs assistance Equipment used: Rolling walker (2 wheels) Transfers: Sit to/from Stand Sit to Stand: Min guard           General transfer comment: Min guard to stand; min cues to wait for line management    Ambulation/Gait Ambulation/Gait assistance: Min assist, Min guard Gait Distance (Feet): 400 Feet Assistive device: Rolling walker (2 wheels) Gait Pattern/deviations: Step-through pattern, Decreased stride length, Trunk flexed Gait velocity: decreased     General Gait Details: Cues for RW proximity, posture, and navigating around Optician, dispensing    Modified Rankin (Stroke Patients Only)       Balance Overall balance assessment: Needs assistance Sitting-balance support: No upper extremity supported, Feet supported Sitting balance-Leahy Scale: Good     Standing balance support: During functional activity, Bilateral upper extremity supported Standing balance-Leahy Scale: Poor Standing balance comment: RW for gait                            Cognition Arousal/Alertness: Awake/alert Behavior During Therapy: Flat affect, Impulsive Overall Cognitive Status: Impaired/Different from baseline Area of Impairment: Safety/judgement, Attention, Following commands, Problem solving                 Orientation Level: Disoriented to, Time Current Attention Level: Sustained Memory: Decreased short-term memory Following Commands: Follows one step commands with increased time Safety/Judgement: Decreased awareness of safety, Decreased awareness of deficits   Problem Solving: Slow processing, Decreased initiation General Comments: pt with continued difficulty following directions with continued impulsivity        Exercises  General Comments General comments  (skin integrity, edema, etc.): 8L 45% trach collar; VSS      Pertinent Vitals/Pain Pain Assessment Pain Assessment: No/denies pain    Home Living                          Prior Function            PT Goals (current goals can now be found in the care plan section) Progress towards PT goals: Progressing toward goals    Frequency    Min 3X/week      PT Plan Current plan remains appropriate    Co-evaluation              AM-PAC PT "6 Clicks" Mobility   Outcome Measure  Help needed turning from your back to your side while in a flat bed without using bedrails?: A Little Help needed moving from lying on your back to sitting on the side of a flat bed without using bedrails?: A Little Help needed moving to and from a bed to a chair (including a wheelchair)?: A Little Help needed standing up from a chair using your arms (e.g., wheelchair or bedside chair)?: A Little Help needed to walk in hospital room?: A Little Help needed climbing 3-5 steps with a railing? : A Lot 6 Click Score: 17    End of Session Equipment Utilized During Treatment: Oxygen;Gait belt Activity Tolerance: Patient tolerated treatment well (NP into see pt) Patient left: with call bell/phone within reach;with family/visitor present;in bed;with bed alarm set;with nursing/sitter in room Nurse Communication: Mobility status PT Visit Diagnosis: Other abnormalities of gait and mobility (R26.89);Difficulty in walking, not elsewhere classified (R26.2);Muscle weakness (generalized) (M62.81)     Time: 7858-8502 PT Time Calculation (min) (ACUTE ONLY): 17 min  Charges:  $Gait Training: 8-22 mins                     Abran Richard, PT Acute Rehab [facility] [place] 02/05/2022, 1:52 PM

## 2022-02-05 NOTE — Progress Notes (Signed)
Attempted to change PICC line dressing but refused, claimed "leave me alone." Explained to have it changed to prevent infection  but still refused . To try later again.

## 2022-02-05 NOTE — Progress Notes (Signed)
During shift changed approached by the sitter claiming that she found  the picc line catheter on the floor. No bleeding noted. IV team consulted  to assessed the catheter's length.

## 2022-02-05 NOTE — Consult Note (Signed)
    56 yo F PMH schizoaffective disorder (bipolar type), Depression, prior SI, polysubstance use disorder, COPD, HTN, DM presented to the ED following suicide attempt. Patient had been drinking, while with friends stated that she was going to kill herself and was witnessed ingesting a bottle of promethazine.   In transport with EMS became more somnolent. Arrived to ED with GCS 3 and was intubated in this setting. EM discussed case with poison control who recommended supportive care   Psychiatry consulted due to suicide attempt by overdose.  Patient was recently trached 07/30, and currently trialing a trach collar  this morning. She continues to be open to psychiatric visitation, her mother, daughter, grandchildren and sitter are present at the bedside.    Patient is seen and assessed face to face by this nurse practitioner, chart reviewed, sitter present at bedside.  Patient recently completed physical therapy and achieves physical exertion pretty quickly. She did awaken and attempted to sit on the edge of the bed and stand up periodically, although there was limited communication with me. Patient is alert and oriented x3, calm and cooperative, and pleasant.  She does not wish to engage or have much discussion regarding her mental health. Her primary focus today was smiling and interacting with her grandchildren, prior to returning to bed to nap.    She continues to demonstrate clear lucid thought and processes by way of mouthing words, lip syncing, and hand gestures.    Patient continues to meet inpatient psychiatric criteria and IVC.  No changes made at this time.  -WIll continue Haldol '2mg'$  po bid, '1mg'$  Ativan po bid for agitation, anxiety, and delirium. EKG obtained 08/04 , QTc 436.  Patient appears to be improving and responding to current psychiatric regimen.  Patient continues to receive 1-2 doses of Haldol PRN daily. -Will start Abilify 2 mg p.o. daily x 2, with a plan to increase Abilify to 5  mg p.o. daily to target depressed mood, agitation, impulsivity, and suicidality. -WIll obtain labs and repeat EKG.  -Continue IVC papers as she remains a risk to self.  Continue suicide sitter -Continue delirium precautions as directed.

## 2022-02-05 NOTE — Progress Notes (Signed)
Occupational Therapy Treatment Patient Details Name: [name] MRN: [id number] DOB: Mar 30, 1966 Today's Date: 02/05/2022   History of present illness 56 y.o. female admitted 01/12/22 after suicide attempt with intentional promethazine OD. EEG without seizures; showed evidence of epileptogenicity. ETT 7/23-7/25. Reintubated 7/26. Ileus. 7/30 extubated but then trached and returned to vent. PMHx:Afib, COPD, DM, neuropathy, HTN, bipolar disorder, schizophrenia, anxiety, depression, ETOH abuse.   OT comments  Patient continues to make steady progress towards goals in skilled OT session. Patient's session encompassed functional mobility in order to increase overall activity tolerance and further assessment of cognition when completing functional tasks. Patient remains impulsive with all movement, and demonstrates difficulty following one step commands consistently. Patients with O2 above 96% throughout ambulation. Discharge remains appropriate, OT will continue to follow acutely.     Recommendations for follow up therapy are one component of a multi-disciplinary discharge planning process, led by the attending physician.  Recommendations may be updated based on patient status, additional functional criteria and insurance authorization.    Follow Up Recommendations  Skilled nursing-short term rehab (<3 hours/day)    Assistance Recommended at Discharge Frequent or constant Supervision/Assistance  Patient can return home with the following  A little help with walking and/or transfers;A lot of help with bathing/dressing/bathroom;Assistance with cooking/housework;Assistance with feeding;Direct supervision/assist for medications management;Direct supervision/assist for financial management;Assist for transportation;Help with stairs or ramp for entrance   Equipment Recommendations  Other (comment) (Defer to next venue)    Recommendations for Other Services      Precautions / Restrictions  Precautions Precautions: Fall;Other (comment) Precaution Comments: trach, watch sats Restrictions Weight Bearing Restrictions: No       Mobility Bed Mobility               General bed mobility comments: patient up in chair upon arrival    Transfers Overall transfer level: Needs assistance Equipment used: Rolling walker (2 wheels) Transfers: Sit to/from Stand Sit to Stand: Min guard, Supervision           General transfer comment: min guard to come to standing for safety     Balance Overall balance assessment: Needs assistance   Sitting balance-Leahy Scale: Good Sitting balance - Comments: sitting without support   Standing balance support: During functional activity, Bilateral upper extremity supported Standing balance-Leahy Scale: Poor Standing balance comment: RW for gait                           ADL either performed or assessed with clinical judgement   ADL Overall ADL's : Needs assistance/impaired                                     Functional mobility during ADLs: Minimal assistance;Rolling walker (2 wheels) General ADL Comments: Session focus on functional mobility as patient was impusively attempting to get up with RT in the room, RN asking OT for assistance    Extremity/Trunk Assessment              Vision       Perception     Praxis      Cognition Arousal/Alertness: Awake/alert Behavior During Therapy: Flat affect, Impulsive Overall Cognitive Status: Impaired/Different from baseline Area of Impairment: Safety/judgement, Attention, Following commands, Problem solving                 Orientation Level: Disoriented to, Time Current Attention Level:  Sustained Memory: Decreased short-term memory Following Commands: Follows one step commands with increased time Safety/Judgement: Decreased awareness of safety, Decreased awareness of deficits   Problem Solving: Slow processing, Decreased  initiation General Comments: pt with continued difficulty following directions with continued impulsivity        Exercises      Shoulder Instructions       General Comments      Pertinent Vitals/ Pain       Pain Assessment Pain Assessment: No/denies pain  Home Living                                          Prior Functioning/Environment              Frequency  Min 2X/week        Progress Toward Goals  OT Goals(current goals can now be found in the care plan section)  Progress towards OT goals: Progressing toward goals  Acute Rehab OT Goals Patient Stated Goal: did not state OT Goal Formulation: Patient unable to participate in goal setting Time For Goal Achievement: 02/13/22 Potential to Achieve Goals: [place] Discharge plan remains appropriate    Co-evaluation                 AM-PAC OT "6 Clicks" Daily Activity     Outcome Measure   Help from another person eating meals?: A Little Help from another person taking care of personal grooming?: A Little Help from another person toileting, which includes using toliet, bedpan, or urinal?: A Lot Help from another person bathing (including washing, rinsing, drying)?: Total Help from another person to put on and taking off regular upper body clothing?: A Lot Help from another person to put on and taking off regular lower body clothing?: Total 6 Click Score: 12    End of Session Equipment Utilized During Treatment: Oxygen (8L 35%)  OT Visit Diagnosis: Unsteadiness on feet (R26.81);Muscle weakness (generalized) (M62.81)   Activity Tolerance Patient tolerated treatment well   Patient Left in chair;with call bell/phone within reach;with nursing/sitter in room   Nurse Communication Mobility status        Time: 2956-2130 OT Time Calculation (min): 19 min  Charges: OT General Charges $OT Visit: 1 Visit OT Treatments $Self Care/Home Management : 8-22 mins  Corinne Ports E.  Brittannie Tawney, OTR/L Acute Rehabilitation Services [phone]   Ascencion Dike 02/05/2022, 9:25 AM

## 2022-02-05 NOTE — Clinical Social Work Note (Signed)
TOC Supervisor met with patient and patient mother at bedside to complete POA paperwork.  Patient would like to identify daughter, [name] as [place].  TOC Supervisor confirmed patient name, DOB and current location with patient and had her sign document.  Document notarized and copy provided to patient family and placed in chart.  Sitter, Jeanmarie Hubert, witnessed interaction and patient signature.  Unit CSW remains remains available for support and disposition planning.  Barbette Or, [place]

## 2022-02-05 NOTE — Consult Note (Signed)
WOC Nurse Consult Note: Reason for Consult: pressure injury Wound type: none Pressure Injury POA: NA Measurement:NA Wound bed: NA, skin is tender, rough but blanchable  Drainage (amount, consistency, odor) none Periwound: I intact  Dressing procedure/placement/frequency: Continue silicone foam for protection and insulation per the skin care order set. Turn and reposition patient at least every 2 hours.   Discussed POC with patient and bedside nurse.  Re consult if needed, will not follow at this time. Thanks  Sammye Staff [employer], RN,CWOCN, CNS, [place] [phone])

## 2022-02-05 NOTE — Progress Notes (Signed)
Have a multiple ambulation along the hallway with the sitter, tolerated well.

## 2022-02-06 ENCOUNTER — Inpatient Hospital Stay (HOSPITAL_COMMUNITY): Payer: Medicare Other

## 2022-02-06 DIAGNOSIS — Z430 Encounter for attention to tracheostomy: Secondary | ICD-10-CM | POA: Diagnosis not present

## 2022-02-06 DIAGNOSIS — T50902A Poisoning by unspecified drugs, medicaments and biological substances, intentional self-harm, initial encounter: Secondary | ICD-10-CM | POA: Diagnosis not present

## 2022-02-06 DIAGNOSIS — T50902D Poisoning by unspecified drugs, medicaments and biological substances, intentional self-harm, subsequent encounter: Secondary | ICD-10-CM | POA: Diagnosis not present

## 2022-02-06 DIAGNOSIS — J9601 Acute respiratory failure with hypoxia: Secondary | ICD-10-CM | POA: Diagnosis not present

## 2022-02-06 LAB — CBC WITH DIFFERENTIAL/PLATELET
Abs Immature Granulocytes: 0 10*3/uL (ref 0.00–0.07)
Basophils Absolute: 0.1 10*3/uL (ref 0.0–0.1)
Basophils Relative: 1 %
Eosinophils Absolute: 0.2 10*3/uL (ref 0.0–0.5)
Eosinophils Relative: 2 %
HCT: 24.7 % — ABNORMAL LOW (ref 36.0–46.0)
Hemoglobin: 7.4 g/dL — ABNORMAL LOW (ref 12.0–15.0)
Lymphocytes Relative: 11 %
Lymphs Abs: 1.2 10*3/uL (ref 0.7–4.0)
MCH: 22.1 pg — ABNORMAL LOW (ref 26.0–34.0)
MCHC: 30 g/dL (ref 30.0–36.0)
MCV: 73.7 fL — ABNORMAL LOW (ref 80.0–100.0)
Monocytes Absolute: 0.2 10*3/uL (ref 0.1–1.0)
Monocytes Relative: 2 %
Neutro Abs: 8.8 10*3/uL — ABNORMAL HIGH (ref 1.7–7.7)
Neutrophils Relative %: 84 %
Platelets: 303 10*3/uL (ref 150–400)
RBC: 3.35 MIL/uL — ABNORMAL LOW (ref 3.87–5.11)
RDW: 30.8 % — ABNORMAL HIGH (ref 11.5–15.5)
WBC: 10.5 10*3/uL (ref 4.0–10.5)
nRBC: 0 % (ref 0.0–0.2)
nRBC: 0 /100 WBC

## 2022-02-06 LAB — COMPREHENSIVE METABOLIC PANEL
ALT: 63 U/L — ABNORMAL HIGH (ref 0–44)
AST: 33 U/L (ref 15–41)
Albumin: 1.6 g/dL — ABNORMAL LOW (ref 3.5–5.0)
Alkaline Phosphatase: 122 U/L (ref 38–126)
Anion gap: 5 (ref 5–15)
BUN: 10 mg/dL (ref 6–20)
CO2: 28 mmol/L (ref 22–32)
Calcium: 7.5 mg/dL — ABNORMAL LOW (ref 8.9–10.3)
Chloride: 103 mmol/L (ref 98–111)
Creatinine, Ser: 0.37 mg/dL — ABNORMAL LOW (ref 0.44–1.00)
GFR, Estimated: >60 mL/min (ref >60)
Glucose, Bld: 157 mg/dL — ABNORMAL HIGH (ref 70–99)
Potassium: 4.2 mmol/L (ref 3.5–5.1)
Sodium: 136 mmol/L (ref 135–145)
Total Bilirubin: 0.5 mg/dL (ref 0.3–1.2)
Total Protein: 6.7 g/dL (ref 6.5–8.1)

## 2022-02-06 LAB — PROCALCITONIN: Procalcitonin: <0.1 ng/mL

## 2022-02-06 LAB — GLUCOSE, CAPILLARY
Glucose-Capillary: 84 mg/dL (ref 70–99)
Glucose-Capillary: 195 mg/dL — ABNORMAL HIGH (ref 70–99)
Glucose-Capillary: 236 mg/dL — ABNORMAL HIGH (ref 70–99)
Glucose-Capillary: 235 mg/dL — ABNORMAL HIGH (ref 70–99)
Glucose-Capillary: 266 mg/dL — ABNORMAL HIGH (ref 70–99)
Glucose-Capillary: 90 mg/dL (ref 70–99)

## 2022-02-06 LAB — MAGNESIUM: Magnesium: 2 mg/dL (ref 1.7–2.4)

## 2022-02-06 MED ORDER — METRONIDAZOLE 500 MG/100ML IV SOLN
500.0000 mg | Freq: Two times a day (BID) | INTRAVENOUS | Status: AC
  Administered 2022-02-07: 500 mg via INTRAVENOUS
  Filled 2022-02-06: qty 100

## 2022-02-06 MED ORDER — SODIUM CHLORIDE 0.9 % IV SOLN
2.0000 g | Freq: Two times a day (BID) | INTRAVENOUS | Status: AC
  Administered 2022-02-06: 2 g via INTRAVENOUS
  Filled 2022-02-06: qty 12.5

## 2022-02-06 NOTE — Consult Note (Signed)
Modified Barium Swallow Progress Note  Patient Details  Name: [name] MRN: [id number] Date of Birth: August 16, 1965  Today's Date: 02/06/2022  Modified Barium Swallow completed.  Full report located under Chart Review in the Imaging Section.  Brief recommendations include the following:  Clinical Impression  Study completed with PMV donned and pt exhibiting oropharyngeal dysphagia characterized by impaired mastication and improved bolus cohesion with solids.  A pharyngeal delay noted overall with premature spillage to the level of the valleculae.  Decreased anterior laryngeal movement noted resulting in deep penetration with eventual aspiration secondary to decreased airway closure with larger amounts of thin liquids.  Recommend possible upgrade to mechanical soft (Dysphagia 3) with nectar-thickened liquids with meals with continued ST to strengthen/coordinate musculature/structures to improve overall swallow efficiency/accuracy.   Swallow Evaluation Recommendations       SLP Diet Recommendations: Dysphagia 3 (Mech soft) solids;Nectar thick liquid;Other (Comment) (thin can be given by ST only d/t impulsivity; risk for aspiration)   Liquid Administration via: Cup;No straw   Medication Administration: Other (Comment) (Nectar thick liquids)   Supervision: Full supervision/cueing for compensatory strategies;Patient able to self feed   Compensations: Slow rate;Small sips/bites;Multiple dry swallows after each bite/sip   Postural Changes: Seated upright at 90 degrees   Oral Care Recommendations: Oral care BID;Staff/trained caregiver to provide oral care        Elvina Sidle, M.S., CCC-SLP 02/06/2022,6:51 PM

## 2022-02-06 NOTE — Progress Notes (Signed)
Mobility Specialist Progress Note    02/06/22 1611  Mobility  Activity Ambulated with assistance in hallway  Level of Assistance Contact guard assist, steadying assist  Assistive Device Front wheel walker  Distance Ambulated (ft) 400 ft  Activity Response Tolerated well  $Mobility charge 1 Mobility   Pt received standing bedside and agreeable. C/o stomach pain. Returned to bed with sitter present.   Hildred Alamin Mobility Specialist

## 2022-02-06 NOTE — Progress Notes (Signed)
Requested ativan medication frequency to be changed from BID to every 6 hours.  Per psych NP, Burt Ek, ativan to remain as ordered BID and RN to make note(s) regarding patient behavior.  Per psych patient has been lethargic the past few days.  Patient alert and awake through-out dayshift today 02/06/22.  Patient requiring frequent interactions due to agitation and restlessness. Psych NP aware.

## 2022-02-06 NOTE — Progress Notes (Signed)
Patient agitated and intermittently following commands.  Sitter at bedside.  Patient c/o of wanting her diet advanced (currently on dysphagia 2).  Patient redirected and RN and sitter assisted patient to ambulate in hall.

## 2022-02-06 NOTE — Progress Notes (Signed)
PROGRESS NOTE    [name]  AOZ:[id number] DOB: 1966/04/14 DOA: 01/12/2022 PCP: [name], MD   Brief Narrative:  56 year old with schizoaffective disorder, depression, polysubstance abuse, COPD, HTN, DM2 for suicide attempt and overdosing on promethazine and alcohol.  Initially patient was intubated due to GCS of 3 complicated by aspiration pneumonia.  Eventually when trach placement on 7/30 and transition to trach collar 7/31.  Other complicated issues that she developed ileus, urinary retention with intermittent fluid overload.  Ongoing trach management by PCCM, GI consulted for colonic ileus.  Current events and procedures 7/23 ED after etoh consumption and intentional promethazine overdose. Intubated. Admit to ICU  7/24 Tachycardic to 130s overnight, also with episode of whole body stiffening, ?dystonia vs. seizure. EEG with irritability, LTM initiated. Neuro consulted. 7/25 Extubated, intermittently somnolent/agitated, not managing secretions well. O2 sats stable on [place]. Cortrak difficult to flush. 7/26 Reintubated early AM for hypoxia, airway protection. Unasyn initiated for ?aspiration PNA. LTM EEG discontinued. MRI pending. 7/27 MRI no acute changes. Wakes up follows commands on .7 dex  7/28 off NE  01/19/2022>> Trach after goals of care discussion with daughter 7/31 Transitioned to ATC.  8/2 CT abdomen: Stool in right colon.  Normal appendix.  Placed gastric bypass surgery.  Nasogastric tube in stomach.  No evidence of wall thickening or obstruction and bowel.  Questionable cystitis.  Bibasilar pleural effusions.  Ceftriaxone started. 01/28/2022.  Transferred to hospitalist service on day 16 of her hospital stay    Assessment & Plan:   Acute hypoxic respiratory failure secondary to aspiration, Status post tracheostomy - Tracheostomy placed due to recurrent intubation.  Has 6 mm cuffed Shiley.  Routine trach care.  IV Lasix repeated x 2  on 02/02/2022 due to fluid overload,  also antibiotics were commenced by previous team on 01/31/2022.  Monitor.  Continue trach collar, pulmonary toiletry with trach care.  Monitor closely.  Have requested pulmonary to monitor for trach site care and to evaluate if patient is a candidate for decannulation.   Alcohol and promethazine overdose with likely attempted suicide with history of schizoaffective disorder bipolar type, history of polysubstance abuse.  Seen by psych, currently stable continue to monitor.  For now on supportive care.  On as needed Ativan and Haldol.  Psych team quested to monitor daily to manage extreme anxiety and psych issues.  Per psych she is under IVC and awaits psych bed placement once medically stable i.e  decannulated.   ?? History of seizure disorder.  Currently on as needed Ativan.  EEG here nonacute.  Seen by neurology initially was on Keppra this was discontinued by neurology on 01/19/2022.   Colonic dysmotility with obstipation and some intermittent urinary retention.  On bowel regimen with bowel movements now, abdominal x-ray nonacute, some suprapubic fullness, was toward bladder scans were positive for over 300 cc of retention, received Foley catheter.  Start Flomax on 02/01/2022.  Monitor.  Urinary retention.  On Foley and Flomax, trial of Foley removal on 02/06/2022.   Microcytic anemia.  Check anemia panel, type screen.  Received IV iron, placed on ferrous sulfate along with Folex acid, family and patient agreeable for transfusion if needed.   Moderate protein calorie malnutrition.  On oral diet , DW daughter - patient was a small eater to begin with, as patient is developing leukocytosis and recurrent fluid overload I am going to discontinue TNA for the next few days starting on 02/02/2022 and monitor.  Her chances of developing a blood-borne infection is  very high.  Discussed with daughter.  She is agreeable with the plan as well.  Continue oral protein supplements.   History of tobacco abuse and COPD.   No acute issues counseled to quit tobacco and alcohol.   GERD.  On PPI.     DM type II.  On long-acting insulin along with sliding scale.  CBG (last 3)  Recent Labs    02/05/22 2036 02/05/22 2349 02/06/22 0334  GLUCAP 125* 163* 84   Lab Results  Component Value Date   HGBA1C 7.4 (H) 11/14/2021     DVT prophylaxis: Subcu heparin Code Status: Full  Family Communication:    Daughter [name] [phone] on 02/02/2022, 02/03/22, 02/05/2022  Status is: Inpatient Remains inpatient appropriate because: On going eval for fevers and abd pain   Nutritional status  Signs/Symptoms: severe fat depletion, severe muscle depletion  Interventions: Refer to RD note for recommendations, Ensure Enlive (each supplement provides 350kcal and 20 grams of protein), MVI  Body mass index is 17.27 kg/m.    Subjective:   Patient in bed, appears comfortable, denies any headache, no fever, no chest pain or pressure, no shortness of breath , no abdominal pain. No new focal weakness.   Examination:  Awake Alert, No new F.N deficits, tracheostomy in place with trach collar, R. Arm PICC, Foley [place]iona.AT,PERRAL Supple Neck, No JVD,   Symmetrical Chest wall movement, Good air movement bilaterally, CTAB RRR,No Gallops, Rubs or new Murmurs,  +ve B.Sounds, Abd Soft, No tenderness,   No Cyanosis, Clubbing or edema     Objective: Vitals:   02/06/22 0336 02/06/22 0341 02/06/22 0709 02/06/22 0844  BP: 108/76  114/72   Pulse: 82 82  89  Resp: (!) 23 (!) 22 20 (!) 26  Temp: 98.1 F (36.7 C)  98.1 F (36.7 C)   TempSrc: Oral  Oral   SpO2: 94% 95%  97%  Weight:  40.1 kg    Height:        Intake/Output Summary (Last 24 hours) at 02/06/2022 0900 Last data filed at 02/06/2022 7846 Gross per 24 hour  Intake 790 ml  Output 3925 ml  Net -3135 ml   Filed Weights   02/02/22 0409 02/03/22 0438 02/06/22 0341  Weight: 45 kg 40.1 kg 40.1 kg     Data Reviewed:   Recent Labs  Lab 02/02/22 0620  02/03/22 0614 02/04/22 0500 02/05/22 0706 02/06/22 0032  WBC 21.1* 17.6* 9.7 9.6 10.5  HGB 7.2* 7.7* 7.5* 8.2* 7.4*  HCT 24.0* 25.4* 24.9* 27.4* 24.7*  PLT 322 357 PLATELET CLUMPS NOTED ON SMEAR, COUNT APPEARS ADEQUATE 344 303  MCV 71.0* 70.4* 72.0* 73.9* 73.7*  MCH 21.3* 21.3* 21.7* 22.1* 22.1*  MCHC 30.0 30.3 30.1 29.9* 30.0  RDW 29.8* 30.3* 30.6* 31.2* 30.8*  LYMPHSABS 2.6  --  2.5 2.4 1.2  MONOABS 1.7*  --  0.7 0.6 0.2  EOSABS 0.0  --  0.1 0.1 0.2  BASOSABS 0.0  --  0.0 0.1 0.1    Recent Labs  Lab 01/31/22 0654 01/31/22 0654 01/31/22 1235 01/31/22 1620 02/01/22 0634 02/02/22 0316 02/03/22 0430 02/04/22 0500 02/05/22 0706 02/06/22 0032  NA 131*  --   --   --  133* 135 134* 137 134* 136  K 4.2  --   --   --  4.4 4.3 4.5 3.8 3.8 4.2  CL 101  --   --   --  104 105 103 105 102 103  CO2 25  --   --   --  $'23 24 26 27 26 28  'l$ GLUCOSE 157*  --   --   --  162* 66* 77 113* 130* 157*  BUN 13  --   --   --  12 23* '19 11 6 10  '$ CREATININE 0.34*  --   --   --  0.39* 0.42* 0.39* <0.30* 0.32* 0.37*  CALCIUM 8.2*  --   --   --  8.2* 8.5* 8.0* 7.9* 7.8* 7.5*  AST 37  --   --   --   --   --   --  50* 36 33  ALT 72*  --   --   --   --   --   --  81* 70* 63*  ALKPHOS 146*  --   --   --   --   --   --  142* 132* 122  BILITOT 0.2*  --   --   --   --   --   --  0.2* 0.5 0.5  ALBUMIN 1.6*  --   --   --   --   --   --  1.5* 1.6* 1.6*  MG 1.8  --   --   --  1.8 2.2  --  1.8 1.6* 2.0  PHOS  --   --   --   --  3.2  --  3.4  --   --   --   PROCALCITON  --    < >  --   --  0.45 0.72 0.54 0.20 0.12 <0.10  LATICACIDVEN  --   --  1.3  --   --   --   --   --   --   --   AMMONIA  --   --   --  53*  --   --   --   --   --   --   BNP  --   --   --   --  131.5* 82.1 60.4 58.2 74.0  --    < > = values in this interval not displayed.     Radiology Studies: No results found.   Scheduled Meds:   vitamin C  500 mg Oral Daily   bisacodyl  10 mg Rectal BID   Chlorhexidine Gluconate Cloth  6 each  Topical Daily   cyanocobalamin  1,000 mcg Intramuscular Q7 days   docusate sodium  100 mg Oral BID   feeding supplement  237 mL Oral TID WC & HS   ferrous sulfate  325 mg Oral BID WC   folic acid  1 mg Oral Daily   guaiFENesin  600 mg Oral BID   heparin injection (subcutaneous)  5,000 Units Subcutaneous Q8H   insulin aspart  0-15 Units Subcutaneous Q4H   lactulose  30 g Oral TID   linaclotide  145 mcg Oral QAC breakfast   LORazepam  1 mg Intravenous BID   metoCLOPramide (REGLAN) injection  5 mg Intravenous Q6H   metoprolol tartrate  25 mg Oral BID   multivitamin with minerals  1 tablet Oral QHS   mouth rinse  15 mL Mouth Rinse 4 times per day   pantoprazole  40 mg Oral QHS   revefenacin  175 mcg Nebulization Daily   simethicone  40 mg Oral TID AC & HS   sodium chloride flush  10-40 mL Intracatheter Q12H   tamsulosin  0.4 mg Oral Daily   vitamin A  50,000 Units Intramuscular Daily  Followed by   Derrill Memo ON 02/11/2022] vitamin A  10,000 Units Oral Daily   Vitamin D (Ergocalciferol)  50,000 Units Oral Q7 days   Continuous Infusions:  sodium chloride Stopped (01/31/22 1226)   ceFEPime (MAXIPIME) IV 2 g (02/06/22 0859)   metronidazole 500 mg (02/05/22 2340)     LOS: 25 days   Time spent= 35 mins    Lala Lund, MD [facility]  If 7PM-7AM, please contact night-coverage  02/06/2022, 9:00 AM

## 2022-02-06 NOTE — Progress Notes (Signed)
Patient irritable and intermittently pulling off cardiac monitor and pulling off trach collar.  Patient instructed multiple times to not pull off oxygen and equipment.  RN and sitter assisted patient each time with putting trach collar and cardiac monitoring back on.  RN and sitter attempted to walk with patient outside.  Patient agitated that she is unable to obtain food from gift shop and [employer].   Patient asking all visitors and staff passing by for additional food.  Patient assisted back into room.

## 2022-02-06 NOTE — Progress Notes (Signed)
NAME:  [name], MRN:  [id number], DOB:  08-27-1965, LOS: 28 ADMISSION DATE:  01/12/2022, CONSULTATION DATE:  01/12/21 REFERRING MD:  Sedonia Small - EM, CHIEF COMPLAINT:  Intentional Overdose    History of Present Illness:  56 y/o F PMH schizoaffective disorder (bipolar type), Depression, prior SI, polysubstance use disorder, COPD, HTN, DM presented to the ED following suicide attempt. Patient had been drinking, while with friends stated that she was going to kill herself and was witnessed ingesting a bottle of promethazine.   In transport with EMS became more somnolent. Arrived to ED with GCS 3 and was intubated in this setting. EM discussed case with poison control who recommended supportive care  PCCM consulted for admission in this setting.  Pertinent Medical History:  Schizoaffective disorder, bipolar type Depression Polysubstance abuse  COPD HTN DM    Significant Hospital Events: Including procedures, antibiotic start and stop dates in addition to other pertinent events   7/23 ED after etoh consumption and intentional promethazine overdose. Intubated. Admit to ICU  7/24 Tachycardic to 130s overnight, also with episode of whole body stiffening, ?dystonia vs. seizure. EEG with irritability, LTM initiated. Neuro consulted. 7/25 Extubated, intermittently somnolent/agitated, not managing secretions well. O2 sats stable on [place]. Cortrak difficult to flush. 7/26 Reintubated early AM for hypoxia, airway protection. Unasyn initiated for ?aspiration PNA. LTM EEG discontinued. MRI pending. 7/27 MRI no acute changes. Wakes up follows commands on .7 dex  7/28 off NE  01/19/2022>> Trach after goals of care discussion with daughter 7/31 Transitioned to ATC.  8/2 CT abdomen: Stool in right colon.  Normal appendix.  Placed gastric bypass surgery.  Nasogastric tube in stomach.  No evidence of wall thickening or obstruction and bowel.  Questionable cystitis.  Bibasilar pleural effusions.  Ceftriaxone  started.  Interim History / Subjective:  Awake, alert, somnolent but has been active prior to my visit and walking the halls Wearing PMV today and tolerating with weak voice.   Objective:  Blood pressure 114/72, pulse 81, temperature 98.1 F (36.7 C), temperature source Oral, resp. rate 20, height 5' (1.524 m), weight 40.1 kg, last menstrual period 01/08/2011, SpO2 99 %.    FiO2 (%):  [28 %-35 %] 28 %   Intake/Output Summary (Last 24 hours) at 02/06/2022 1030 Last data filed at 02/06/2022 2683 Gross per 24 hour  Intake 790 ml  Output 3925 ml  Net -3135 ml   Filed Weights   02/02/22 0409 02/03/22 0438 02/06/22 0341  Weight: 45 kg 40.1 kg 40.1 kg   Physical Examination: Laying comfortably in bed, no respiratory distress 6.5 mm ID shiley flexible trach in place. PMV on Moves all 4 extremities independently. Strength is symmetric and speech is quiet but normal.  Minimal secretions  Resolved Hospital Problem List:   Hypotension, suspect sedation related   Acute toxic encephalopathy in the setting of drug overdose Hypokalemia  Assessment & Plan:   Acute Hypoxic Respiratory Failure in setting of Overdose, subsequently due to Aspiration s/p Tracheostomy  Tobacco Abuse  COPD w/o Exacerbation  -trach care per protocol -wean O2 for sats 88-94% - capping trial today. If ok after 72 hours, will decannulate on Sunday 8/20 -continue Yupelri  -continue SLP efforts   PCCM will follow on 8/20 to evaluate for decannulation.  Please call us sooner if needed.   Lenice Llamas, MD Pulmonary and [place] 02/06/2022 10:32 AM Pager: see AMION  If no response to pager, please call critical care on call (see  AMION) until 7pm After 7:00 pm call Elink

## 2022-02-06 NOTE — Progress Notes (Signed)
Pharmacy Antibiotic Note  [name] is a 56 y.o. female admitted on 01/12/2022.  Pts hospital stay has been complicated by AMS and colonic ileus requiring TPN. Pharmacy has been consulted for Cefepime dosing. Cr remains <1, cultures negative, WBC wnl and fevers resolved.  Plan: Continue Cefepime 2gm IV q12h Flagyl '500mg'$  IV q12h (per MD) Follow-up cx data and clinical progress  Height: 5' (152.4 cm) Weight: 40.1 kg (88 lb 6.5 oz) IBW/kg (Calculated) : 45.5  Temp (24hrs), Avg:98.2 F (36.8 C), Min:97.8 F (36.6 C), Max:99 F (37.2 C)  Recent Labs  Lab 01/31/22 1235 02/01/22 0634 02/02/22 0316 02/02/22 0620 02/03/22 0430 02/03/22 0614 02/04/22 0500 02/05/22 0706 02/06/22 0032  WBC  --    < >  --  21.1*  --  17.6* 9.7 9.6 10.5  CREATININE  --    < > 0.42*  --  0.39*  --  <0.30* 0.32* 0.37*  LATICACIDVEN 1.3  --   --   --   --   --   --   --   --    < > = values in this interval not displayed.     Estimated Creatinine Clearance: 49.1 mL/min (A) (by C-G formula based on SCr of 0.37 mg/dL (L)).    Allergies  Allergen Reactions   Iron Dextran Shortness Of Breath and Anxiety   Aspirin Nausea And Vomiting and Other (See Comments)    Ok to take tylenol or ibuprofen     Antimicrobials this admission: Cefepime 8/11 >> Flagyl 8/11 >> Rocephin (empiric UTI) 8/2 > 8/6 Unasyn (asp pna) 7/26 > 7/30   Microbiology results: Bcx 8/10: ngtd Sputum 8/10: normal flora 7/23 MRSA PCR +   Arrie Senate, PharmD, BCPS, [facility] Clinical Pharmacist [phone] Please check AMION for all [facility] Pharmacy numbers 02/06/2022

## 2022-02-06 NOTE — Progress Notes (Signed)
Patient agitated with staff.  States that we are not caring for and she wants to leave.  Patient has been on multiple walks with staff (every 30 minutes- to- 1 hour.)  Patient constantly asking for foods not on diet order.  MBS evaluation done with speech therapist today.  Per SLP, patient to remain on current diet orders.

## 2022-02-06 NOTE — Progress Notes (Signed)
Patient seen today by trach team for consult.  No education is needed at this time.  All necessary equipment is at beside.   Will continue to follow for progression.  

## 2022-02-07 DIAGNOSIS — J9601 Acute respiratory failure with hypoxia: Secondary | ICD-10-CM | POA: Diagnosis not present

## 2022-02-07 DIAGNOSIS — T50902A Poisoning by unspecified drugs, medicaments and biological substances, intentional self-harm, initial encounter: Secondary | ICD-10-CM | POA: Diagnosis not present

## 2022-02-07 DIAGNOSIS — Z430 Encounter for attention to tracheostomy: Secondary | ICD-10-CM | POA: Diagnosis not present

## 2022-02-07 LAB — COMPREHENSIVE METABOLIC PANEL
ALT: 53 U/L — ABNORMAL HIGH (ref 0–44)
AST: 29 U/L (ref 15–41)
Albumin: 1.8 g/dL — ABNORMAL LOW (ref 3.5–5.0)
Alkaline Phosphatase: 111 U/L (ref 38–126)
Anion gap: 4 — ABNORMAL LOW (ref 5–15)
BUN: 6 mg/dL (ref 6–20)
CO2: 29 mmol/L (ref 22–32)
Calcium: 7.9 mg/dL — ABNORMAL LOW (ref 8.9–10.3)
Chloride: 104 mmol/L (ref 98–111)
Creatinine, Ser: 0.42 mg/dL — ABNORMAL LOW (ref 0.44–1.00)
GFR, Estimated: >60 mL/min (ref >60)
Glucose, Bld: 65 mg/dL — ABNORMAL LOW (ref 70–99)
Potassium: 4.7 mmol/L (ref 3.5–5.1)
Sodium: 137 mmol/L (ref 135–145)
Total Bilirubin: 0.3 mg/dL (ref 0.3–1.2)
Total Protein: 6.7 g/dL (ref 6.5–8.1)

## 2022-02-07 LAB — CBC WITH DIFFERENTIAL/PLATELET
Abs Immature Granulocytes: 0.11 10*3/uL — ABNORMAL HIGH (ref 0.00–0.07)
Basophils Absolute: 0 10*3/uL (ref 0.0–0.1)
Basophils Relative: 1 %
Eosinophils Absolute: 0.1 10*3/uL (ref 0.0–0.5)
Eosinophils Relative: 1 %
HCT: 26.9 % — ABNORMAL LOW (ref 36.0–46.0)
Hemoglobin: 7.9 g/dL — ABNORMAL LOW (ref 12.0–15.0)
Immature Granulocytes: 1 %
Lymphocytes Relative: 29 %
Lymphs Abs: 2.4 10*3/uL (ref 0.7–4.0)
MCH: 21.7 pg — ABNORMAL LOW (ref 26.0–34.0)
MCHC: 29.4 g/dL — ABNORMAL LOW (ref 30.0–36.0)
MCV: 73.9 fL — ABNORMAL LOW (ref 80.0–100.0)
Monocytes Absolute: 0.7 10*3/uL (ref 0.1–1.0)
Monocytes Relative: 9 %
Neutro Abs: 4.8 10*3/uL (ref 1.7–7.7)
Neutrophils Relative %: 59 %
Platelets: 334 10*3/uL (ref 150–400)
RBC: 3.64 MIL/uL — ABNORMAL LOW (ref 3.87–5.11)
RDW: 31.9 % — ABNORMAL HIGH (ref 11.5–15.5)
WBC: 8.2 10*3/uL (ref 4.0–10.5)
nRBC: 0 % (ref 0.0–0.2)

## 2022-02-07 LAB — GLUCOSE, CAPILLARY
Glucose-Capillary: 177 mg/dL — ABNORMAL HIGH (ref 70–99)
Glucose-Capillary: 125 mg/dL — ABNORMAL HIGH (ref 70–99)
Glucose-Capillary: 163 mg/dL — ABNORMAL HIGH (ref 70–99)
Glucose-Capillary: 204 mg/dL — ABNORMAL HIGH (ref 70–99)
Glucose-Capillary: 207 mg/dL — ABNORMAL HIGH (ref 70–99)

## 2022-02-07 LAB — PROCALCITONIN: Procalcitonin: <0.1 ng/mL

## 2022-02-07 LAB — MAGNESIUM: Magnesium: 2 mg/dL (ref 1.7–2.4)

## 2022-02-07 MED ORDER — LORAZEPAM 2 MG/ML IJ SOLN: 1.0000 mg | Freq: Once | INTRAMUSCULAR | Status: AC

## 2022-02-07 MED ORDER — ZIPRASIDONE MESYLATE 20 MG IM SOLR
20.0000 mg | Freq: Once | INTRAMUSCULAR | Status: AC
  Administered 2022-02-07: 20 mg via INTRAMUSCULAR
  Filled 2022-02-07: qty 20

## 2022-02-07 MED ORDER — STERILE WATER FOR INJECTION IJ SOLN
INTRAMUSCULAR | Status: AC
  Filled 2022-02-07: qty 10

## 2022-02-07 MED ORDER — ZIPRASIDONE HCL 40 MG PO CAPS
40.0000 mg | ORAL_CAPSULE | Freq: Once | ORAL | Status: AC
  Administered 2022-02-07: 40 mg via ORAL
  Filled 2022-02-07: qty 1

## 2022-02-07 MED ORDER — ARIPIPRAZOLE 5 MG PO TABS
5.0000 mg | ORAL_TABLET | Freq: Every day | ORAL | Status: DC
  Administered 2022-02-07 – 2022-02-09 (×3): 5 mg via ORAL
  Filled 2022-02-07 (×3): qty 1

## 2022-02-07 MED ORDER — INSULIN ASPART 100 UNIT/ML IJ SOLN
0.0000 [IU] | Freq: Three times a day (TID) | INTRAMUSCULAR | Status: DC
  Administered 2022-02-07: 3 [IU] via SUBCUTANEOUS
  Administered 2022-02-07: 2 [IU] via SUBCUTANEOUS
  Administered 2022-02-08 – 2022-02-09 (×2): 5 [IU] via SUBCUTANEOUS
  Administered 2022-02-09: 2 [IU] via SUBCUTANEOUS
  Administered 2022-02-10: 5 [IU] via SUBCUTANEOUS
  Administered 2022-02-10: 3 [IU] via SUBCUTANEOUS
  Administered 2022-02-10 – 2022-02-11 (×2): 2 [IU] via SUBCUTANEOUS
  Administered 2022-02-11 – 2022-02-12 (×2): 3 [IU] via SUBCUTANEOUS
  Administered 2022-02-12: 2 [IU] via SUBCUTANEOUS

## 2022-02-07 MED ORDER — LORAZEPAM 2 MG/ML IJ SOLN
INTRAMUSCULAR | Status: AC
  Administered 2022-02-07: 1 mg via INTRAVENOUS
  Filled 2022-02-07: qty 1

## 2022-02-07 NOTE — Progress Notes (Signed)
Reported by NT that she wanted to go  outside to smoke, Managed to go down to the first floor being followed by the sitter , NT and Development worker, community. Security was paged  and. escorted back to her room.  While in the room attempted to throw the walker  towards the NT ,apprehended at once by security. Dr. Candiss Norse made aware with order. Ativan 1 mg  I'm given at 6 :45 pm. Continue to monitor.

## 2022-02-07 NOTE — Progress Notes (Signed)
Refused to have telemetry monitoring at this time.

## 2022-02-07 NOTE — Progress Notes (Signed)
Speech Language Pathology Treatment: Dysphagia;Passy Muir Speaking valve  Patient Details Name: [name] MRN: [id number] DOB: Jan 18, 1966 Today's Date: 02/07/2022 Time: 3875-6433 SLP Time Calculation (min) (ACUTE ONLY): 22 min  Assessment / Plan / Recommendation Clinical Impression  Pt tolerated PMV during entire session without respiratory compromise and all vitals remained stable throughout session.  Pt wearing PMV during morning per nursing/sitter without difficulty and plan is for decannulation in two days per nursing report.   Pt's vocal quality has improved, but remains hypophonic/min hoarse without verbal cues to project voice during communicative interactions d/t decreased intelligibility (judged to be 50-75% accurate during conversation).   Pt's mentation improved as she answered questions and following simple commands during intake of lunch tray.  SLP provided mod multimodal cues for smaller bites/sips d/t impulsivity/decreased sustained attention with min cooperation despite rationale provided with encouragement.  Trials of thin via guided sips with overt s/s of aspiration noted including delayed throat clearing/cough.  This was noted when pt did not comply with requests for smaller volume of thin liquids.  Recommend pt continue with current diet of Dysphagia 2(Minced)/nectar-thickened liquids d/t min-mod risk for aspiration given recent MBS results and pt behavior/cognition at this time. FULL PRECAUTIONS for swallowing strategies during all intake.   ST will continue to f/u for diet progression/tolerance and PMV tx.    HPI HPI: 56 yo F PMH schizoaffective disorder (bipolar type), Depression, prior SI, polysubstance use disorder, COPD, HTN, DM presented to the ED on 01/12/22 following suicide attempt. Patient had been drinking, while with friends stated that she was going to kill herself and was witnessed ingesting a bottle of promethazine and alcohol overdose.   In transport with EMS became  more somnolent. Arrived to ED with GCS 3 and was intubated in this setting admitted by pulmonary critical care, debated but had problems with aspiration pneumonia, underwent tracheostomy placement on 01/19/2022.  Transition to trach collar on 01/20/2022.  She currently has issues with resolving ileus, intermittent urinary retention, recently treated UTI, intermittent fluid overload.  Pt being seen by ST for PMV tx and dysphagia tx.  Currently on Dysphagia 2/nectar-thick liquids.  MBS repeated on 02/06/22 with improvement overall with swallow, but thin liquids continue to be aspiration risk in larger volumes.  Dyshagia tx to f/u for potential upgrade.      SLP Plan  Continue with current plan of care      Recommendations for follow up therapy are one component of a multi-disciplinary discharge planning process, led by the attending physician.  Recommendations may be updated based on patient status, additional functional criteria and insurance authorization.    Recommendations  Diet recommendations: Dysphagia 2 (fine chop);Nectar-thick liquid Liquids provided via: Cup;Straw Medication Administration: Whole meds with liquid (nectar thickened) Supervision: Patient able to self feed;Full supervision/cueing for compensatory strategies Compensations: Slow rate;Small sips/bites;Minimize environmental distractions;Clear throat intermittently Postural Changes and/or Swallow Maneuvers: Seated upright 90 degrees      Patient may use Passy-Muir Speech Valve: During all waking hours (remove during sleep);During PO intake/meals PMSV Supervision: Intermittent         General recommendations: Other(comment) (TBD) Oral Care Recommendations: Oral care BID Follow Up Recommendations: Other (comment) (TBD) Assistance recommended at discharge: Frequent or constant Supervision/Assistance SLP Visit Diagnosis: Dysphagia, oropharyngeal phase (R13.12) Plan: Continue with current plan of care           Elvina Sidle, M.S., CCC-SLP  02/07/2022, 1:01 PM

## 2022-02-07 NOTE — Progress Notes (Signed)
With multiple ambulation along the hallway with sitter and with walker, on room air tolerated well.

## 2022-02-07 NOTE — Progress Notes (Signed)
Mobility Specialist Progress Note    02/07/22 1206  Mobility  Activity Ambulated with assistance in hallway  Level of Assistance Contact guard assist, steadying assist  Assistive Device Front wheel walker  Distance Ambulated (ft) 300 ft  Activity Response Tolerated well  $Mobility charge 1 Mobility   Post-Mobility: 84 HR, 100% SpO2  Pt received standing EOB and agreeable. No complaints on walk. Returned to sitting EOB with call bell in reach.    Hildred Alamin Mobility Specialist

## 2022-02-07 NOTE — Consult Note (Signed)
56 yo F PMH schizoaffective disorder (bipolar type), Depression, prior SI, polysubstance use disorder, COPD, HTN, DM presented to the ED following suicide attempt. Patient had been drinking, while with friends stated that she was going to kill herself and was witnessed ingesting a bottle of promethazine.   In transport with EMS became more somnolent. Arrived to ED with GCS 3 and was intubated in this setting. EM discussed case with poison control who recommended supportive care   Psychiatry consulted due to suicide attempt by overdose.  Patient was recently trached 07/30, and currently trialing a trach collar  this morning. She continues to be open to psychiatric visitation, her mother, daughter, grandchildren and sitter are present at the bedside.    Patient is seen and assessed face to face by this nurse practitioner, chart reviewed, sitter present at side.  She is observed to be ambulating up the hall with her walker, and turns to the enter the room. Patient is alert and oriented x3, calm and cooperative, and pleasant.  She continues to be evasive about her mental health history and does not want to discuss it. When assessing for intermittent periods of agitation, she denies any triggers or stressors at this time. She is encouraged to communicate with her staff, when she notices she is getting agitated.   Currently on interview, the patient denies symptoms of psychosis or mania, that are contributing to her restlessness. The patient reports she does not remember onset of current episode prior to ED presentation, however reports " They told me I tried to kill myself. It must have been pretty bad for me to do that!". Describes mood as "tired."  Endorses prior psychiatric diagnoses of polysubstance use disorder, depression, bipolar disorder and previous psychiatric medication trials of Wellbutrin, Prozac, BuSpar, Abilify, and Seroquel. Denies current withdrawal symptoms, suicidal ideation, homicidal  ideation, auditory/visual hallucination, and self-harm.  Patient endorses adequate appetite and denies sleep disturbance.   She wishes not to elaborate on her suicide attempt. Will continue to defer until patient is without company and visitors. Patient did not display any restlessness, agitation, aggression or combativeness during this psych evaluation.   Patient continues to meet inpatient psychiatric criteria and IVC, suspect decannulation on 08/20.  No changes made at this time.  -WIll continue Haldol '2mg'$  po bid prn '1mg'$  Ativan po bid for agitation, anxiety, and delirium. EKG obtained 08/04 , QTc 436. Patient continues to receive 1-2 doses of Haldol PRN daily. -Will start Abilify 5 mg p.o. daily to target depressed mood, agitation, impulsivity, and suicidality. -Continue IVC papers as she remains a risk to self.  Continue suicide sitter.  -Continue delirium precautions as directed.

## 2022-02-07 NOTE — Progress Notes (Signed)
Inpatient Diabetes Program Recommendations  AACE/ADA: New Consensus Statement on Inpatient Glycemic Control (2015)  Target Ranges:  Prepandial:   less than 140 mg/dL      Peak postprandial:   less than 180 mg/dL (1-2 hours)      Critically ill patients:  140 - 180 mg/dL   Lab Results  Component Value Date   GLUCAP 125 (H) 02/07/2022   HGBA1C 7.4 (H) 11/14/2021    Review of Glycemic Control  Latest Reference Range & Units 02/06/22 11:32 02/06/22 15:28 02/06/22 20:08 02/06/22 23:10 02/07/22 03:08 02/07/22 07:50  Glucose-Capillary 70 - 99 mg/dL 236 (H) 235 (H) 266 (H) 90 177 (H) 125 (H)   Diabetes history:  DM 2 Outpatient Diabetes medications:  Novolog 3-20 units tid with meals, Metformin 500 mg bid Current orders for Inpatient glycemic control:  Novolog 0-15 units q 4 hours  Inpatient Diabetes Program Recommendations:    Consider changing Novolog correction to tid with meals and HS scale (instead of q 4 hours).   Thanks,  Adah Perl, RN, BC-ADM Inpatient Diabetes Coordinator Pager [phone]  (8a-5p)

## 2022-02-07 NOTE — Progress Notes (Signed)
PROGRESS NOTE    [name]  OIZ:[id number] DOB: 16-Jul-1965 DOA: 01/12/2022 PCP: [name], MD   Brief Narrative:  57 year old with schizoaffective disorder, depression, polysubstance abuse, COPD, HTN, DM2 for suicide attempt and overdosing on promethazine and alcohol.  Initially patient was intubated due to GCS of 3 complicated by aspiration pneumonia.  Eventually when trach placement on 7/30 and transition to trach collar 7/31.  Other complicated issues that she developed ileus, urinary retention with intermittent fluid overload.  Ongoing trach management by PCCM, GI consulted for colonic ileus.  Current events and procedures 7/23 ED after etoh consumption and intentional promethazine overdose. Intubated. Admit to ICU  7/24 Tachycardic to 130s overnight, also with episode of whole body stiffening, ?dystonia vs. seizure. EEG with irritability, LTM initiated. Neuro consulted. 7/25 Extubated, intermittently somnolent/agitated, not managing secretions well. O2 sats stable on . Cortrak difficult to flush. 7/26 Reintubated early AM for hypoxia, airway protection. Unasyn initiated for ?aspiration PNA. LTM EEG discontinued. MRI pending. 7/27 MRI no acute changes. Wakes up follows commands on .7 dex  7/28 off NE  01/19/2022>> Trach after goals of care discussion with daughter 7/31 Transitioned to ATC.  8/2 CT abdomen: Stool in right colon.  Normal appendix.  Placed gastric bypass surgery.  Nasogastric tube in stomach.  No evidence of wall thickening or obstruction and bowel.  Questionable cystitis.  Bibasilar pleural effusions.  Ceftriaxone started. 01/28/2022.  Transferred to hospitalist service on day 16 of her hospital stay    Assessment & Plan:   Acute hypoxic respiratory failure secondary to aspiration, Status post tracheostomy - Tracheostomy placed due to recurrent intubation.  Has 6 mm cuffed Shiley.  Routine trach care.  IV Lasix repeated x 2  on 02/02/2022 due to fluid overload,  also antibiotics were commenced by previous team on 01/31/2022.  Monitor.  Continue trach collar, pulmonary toiletry with trach care.  Monitor closely.  Have requested pulmonary to monitor for trach site care and to evaluate if patient is a candidate for decannulation.   Alcohol and promethazine overdose with likely attempted suicide with history of schizoaffective disorder bipolar type, history of polysubstance abuse.  Seen by psych, currently stable continue to monitor.  For now on supportive care.  On as needed Ativan and Haldol.  Psych team quested to monitor daily to manage extreme anxiety and psych issues.  Per psych she is under IVC and awaits psych bed placement once medically stable i.e  decannulated.   ?? History of seizure disorder.  Currently on as needed Ativan.  EEG here nonacute.  Seen by neurology initially was on Keppra this was discontinued by neurology on 01/19/2022.   Colonic dysmotility with obstipation and some intermittent urinary retention.  On bowel regimen with bowel movements now, abdominal x-ray nonacute, some suprapubic fullness, was toward bladder scans were positive for over 300 cc of retention, received Foley catheter.  Start Flomax on 02/01/2022.  Monitor.  Urinary retention.  On Foley and Flomax, trial of Foley removal on 02/06/2022.   Microcytic anemia.  Check anemia panel, type screen.  Received IV iron, placed on ferrous sulfate along with Folex acid, family and patient agreeable for transfusion if needed.   Moderate protein calorie malnutrition.  On oral diet , DW daughter - patient was a small eater to begin with, as patient is developing leukocytosis and recurrent fluid overload I am going to discontinue TNA for the next few days starting on 02/02/2022 and monitor.  Her chances of developing a blood-borne infection is  very high.  Discussed with daughter.  She is agreeable with the plan as well.  Continue oral protein supplements.   History of tobacco abuse and COPD.   No acute issues counseled to quit tobacco and alcohol.   GERD.  On PPI.     DM type II.  On long-acting insulin along with sliding scale.  CBG (last 3)  Recent Labs    02/06/22 2310 02/07/22 0308 02/07/22 0750  GLUCAP 90 177* 125*   Lab Results  Component Value Date   HGBA1C 7.4 (H) 11/14/2021     DVT prophylaxis: Subcu heparin Code Status: Full  Family Communication:    Daughter [name] [phone] on 02/02/2022, 02/03/22, 02/05/2022  Status is: Inpatient Remains inpatient appropriate because: On going eval for fevers and abd pain   Nutritional status  Signs/Symptoms: severe fat depletion, severe muscle depletion  Interventions: Refer to RD note for recommendations, Ensure Enlive (each supplement provides 350kcal and 20 grams of protein), MVI  Body mass index is 20.54 kg/m.    Subjective:  Patient in bed, appears comfortable, denies any headache, no fever, no chest pain or pressure, no shortness of breath , no abdominal pain. No new focal weakness.  Examination:  Awake Alert, No new F.N deficits, tracheostomy in place with trach collar, R. Arm PICC, Foley [place].AT,PERRAL Supple Neck, No JVD,   Symmetrical Chest wall movement, Good air movement bilaterally, CTAB RRR,No Gallops, Rubs or new Murmurs,  +ve B.Sounds, Abd Soft, No tenderness,   No Cyanosis, Clubbing or edema    Objective: Vitals:   02/06/22 2314 02/07/22 0252 02/07/22 0341 02/07/22 0700  BP:      Pulse: 77  95 91  Resp: 20  (!) 28 (!) 25  Temp:  98.1 F (36.7 C)  98.2 F (36.8 C)  TempSrc:  Oral  Oral  SpO2: 94%  97% 96%  Weight:  47.7 kg    Height:        Intake/Output Summary (Last 24 hours) at 02/07/2022 0918 Last data filed at 02/07/2022 0806 Gross per 24 hour  Intake 2216.32 ml  Output 3245 ml  Net -1028.68 ml   Filed Weights   02/03/22 0438 02/06/22 0341 02/07/22 0252  Weight: 40.1 kg 40.1 kg 47.7 kg     Data Reviewed:   Recent Labs  Lab 02/02/22 0620 02/03/22 0614  02/04/22 0500 02/05/22 0706 02/06/22 0032 02/07/22 0030  WBC 21.1* 17.6* 9.7 9.6 10.5 8.2  HGB 7.2* 7.7* 7.5* 8.2* 7.4* 7.9*  HCT 24.0* 25.4* 24.9* 27.4* 24.7* 26.9*  PLT 322 357 PLATELET CLUMPS NOTED ON SMEAR, COUNT APPEARS ADEQUATE 344 303 334  MCV 71.0* 70.4* 72.0* 73.9* 73.7* 73.9*  MCH 21.3* 21.3* 21.7* 22.1* 22.1* 21.7*  MCHC 30.0 30.3 30.1 29.9* 30.0 29.4*  RDW 29.8* 30.3* 30.6* 31.2* 30.8* 31.9*  LYMPHSABS 2.6  --  2.5 2.4 1.2 2.4  MONOABS 1.7*  --  0.7 0.6 0.2 0.7  EOSABS 0.0  --  0.1 0.1 0.2 0.1  BASOSABS 0.0  --  0.0 0.1 0.1 0.0    Recent Labs  Lab 01/31/22 1235 01/31/22 1620 02/01/22 0634 02/01/22 0634 02/02/22 0316 02/03/22 0430 02/04/22 0500 02/05/22 0706 02/06/22 0032 02/07/22 0030  NA  --   --  133*   < > 135 134* 137 134* 136 137  K  --   --  4.4   < > 4.3 4.5 3.8 3.8 4.2 4.7  CL  --   --  104   < >  105 103 105 102 103 104  CO2  --   --  23   < > '24 26 27 26 28 29  '$ GLUCOSE  --   --  162*   < > 66* 77 113* 130* 157* 65*  BUN  --   --  12   < > 23* '19 11 6 10 6  '$ CREATININE  --   --  0.39*   < > 0.42* 0.39* <0.30* 0.32* 0.37* 0.42*  CALCIUM  --   --  8.2*   < > 8.5* 8.0* 7.9* 7.8* 7.5* 7.9*  AST  --   --   --   --   --   --  50* 36 33 29  ALT  --   --   --   --   --   --  81* 70* 63* 53*  ALKPHOS  --   --   --   --   --   --  142* 132* 122 111  BILITOT  --   --   --   --   --   --  0.2* 0.5 0.5 0.3  ALBUMIN  --   --   --   --   --   --  1.5* 1.6* 1.6* 1.8*  MG  --   --  1.8   < > 2.2  --  1.8 1.6* 2.0 2.0  PHOS  --   --  3.2  --   --  3.4  --   --   --   --   PROCALCITON  --   --  0.45   < > 0.72 0.54 0.20 0.12 <0.10 <0.10  LATICACIDVEN 1.3  --   --   --   --   --   --   --   --   --   AMMONIA  --  53*  --   --   --   --   --   --   --   --   BNP  --   --  131.5*  --  82.1 60.4 58.2 74.0  --   --    < > = values in this interval not displayed.     Radiology Studies: DG Swallowing Func-Speech Pathology  Result Date: 02/06/2022 Table formatting from  the original result was not included. Objective Swallowing Evaluation: Type of Study: MBS-Modified Barium Swallow Study  Patient Details Name: ERIENNE SPELMAN MRN: [id number] Date of Birth: 06-07-1966 Today's Date: 02/06/2022 Time: SLP Start Time (ACUTE ONLY): 45 -SLP Stop Time (ACUTE ONLY): 1045 SLP Time Calculation (min) (ACUTE ONLY): 15 min Past Medical History: Past Medical History: Diagnosis Date  Abdominal pain   Accidental drug overdose April 2013  Anxiety   Atrial fibrillation ([place]) 09/29/11  converted spontaneously  Chronic back pain   Chronic knee pain   Chronic nausea   Chronic pain   COPD (chronic obstructive pulmonary disease) ([place])   Depression   Diabetes mellitus   states her doctor took her off all DM meds in past month  Diabetic neuropathy ([place])   Dyspnea   with exertion   GERD (gastroesophageal reflux disease)   Headache(784.0)   migraines   HTN (hypertension)   not on meds since in a year   Hyperlipidemia   Hypothyroidism   not on meds in a while   Mental disorder   Bipolar and schizophrenic  Requires supplemental oxygen   as needed per patient   Schizophrenia (  [place])   Schizophrenia, acute ([place]) 11/13/2017  Tobacco abuse  Past Surgical History: Past Surgical History: Procedure Laterality Date  ABDOMINAL HYSTERECTOMY    BLADDER SUSPENSION  03/04/2011  Procedure: [place];  Surgeon: Elayne Snare MacDiarmid;  Location: [facility];  Service: Urology;  Laterality: N/A;  CYSTOCELE REPAIR  03/04/2011  Procedure: ANTERIOR REPAIR (CYSTOCELE);  Surgeon: Reece Packer;  Location: [facility];  Service: Urology;  Laterality: N/A;  CYSTOSCOPY  03/04/2011  Procedure: CYSTOSCOPY;  Surgeon: Elayne Snare MacDiarmid;  Location: [facility];  Service: Urology;  Laterality: N/A;  ESOPHAGOGASTRODUODENOSCOPY (EGD) WITH PROPOFOL N/A 05/12/2017  Procedure: ESOPHAGOGASTRODUODENOSCOPY (EGD) WITH PROPOFOL;  Surgeon: Alphonsa Overall, MD;  Location: [facility];  Service: General;  Laterality: N/A;  GASTRIC ROUX-EN-Y N/A 03/25/2016  Procedure:  LAPAROSCOPIC ROUX-EN-Y GASTRIC BYPASS WITH UPPER ENDOSCOPY;  Surgeon: Excell Seltzer, MD;  Location: [facility];  Service: General;  Laterality: N/A;  KNEE SURGERY    LAPAROSCOPIC ASSISTED VAGINAL HYSTERECTOMY  03/04/2011  Procedure: LAPAROSCOPIC ASSISTED VAGINAL HYSTERECTOMY;  Surgeon: Cyril Mourning, MD;  Location: [facility];  Service: Gynecology;  Laterality: N/A; HPI: 56 yo F PMH schizoaffective disorder (bipolar type), Depression, prior SI, polysubstance use disorder, COPD, HTN, DM presented to the ED on 01/12/22 following suicide attempt. Patient had been drinking, while with friends stated that she was going to kill herself and was witnessed ingesting a bottle of promethazine and alcohol overdose.   In transport with EMS became more somnolent. Arrived to ED with GCS 3 and was intubated in this setting admitted by pulmonary critical care, debated but had problems with aspiration pneumonia, underwent tracheostomy placement on 01/19/2022.  Transition to trach collar on 01/20/2022.  She currently has issues with resolving ileus, intermittent urinary retention, recently treated UTI, intermittent fluid overload.  Pt being seen by ST for PMV tx and dysphagia tx.  Currently on Dysphagia 2/nectar-thick liquids.  MBS ordered to progress diet as able.  Subjective: "I want that cracker"  Recommendations for follow up therapy are one component of a multi-disciplinary discharge planning process, led by the attending physician.  Recommendations may be updated based on patient status, additional functional criteria and insurance authorization. Assessment / Plan / Recommendation   02/06/2022   6:25 PM Clinical Impressions Clinical Impression Study completed with PMV donned and pt exhibiting oropharyngeal dysphagia characterized by impaired mastication and improved bolus cohesion with solids.  A pharyngeal delay noted overall with premature spillage to the level of the valleculae.  Decreased anterior laryngeal movement noted  resulting in deep penetration with eventual aspiration secondary to decreased airway closure with larger amounts of thin liquids.  Recommend possible upgrade to mechanical soft (Dysphagia 3) with nectar-thickened liquids with meals with continued ST to strengthen/coordinate musculature/structures to improve overall swallow efficiency/accuracy. SLP Visit Diagnosis Dysphagia, oropharyngeal phase (R13.12) Impact on safety and function Mild aspiration risk;Moderate aspiration risk     02/06/2022   6:25 PM Treatment Recommendations Treatment Recommendations Therapy as outlined in treatment plan below     02/06/2022   6:25 PM Prognosis Prognosis for Safe Diet Advancement Good Barriers to Reach Goals Behavior   02/06/2022   6:25 PM Diet Recommendations SLP Diet Recommendations Dysphagia 3 (Mech soft) solids;Nectar thick liquid;Other (Comment) Liquid Administration via Cup;No straw Medication Administration Other (Comment) Compensations Slow rate;Small sips/bites;Multiple dry swallows after each bite/sip Postural Changes Seated upright at 90 degrees     02/06/2022   6:25 PM Other Recommendations Oral Care Recommendations Oral care BID;Staff/trained caregiver to provide oral care Follow Up Recommendations Follow  physician's recommendations for discharge plan and follow up therapies Assistance recommended at discharge Frequent or constant Supervision/Assistance Functional Status Assessment Patient has had a recent decline in their functional status and demonstrates the ability to make significant improvements in function in a reasonable and predictable amount of time.   02/06/2022   6:25 PM Frequency and Duration  Speech Therapy Frequency (ACUTE ONLY) min 2x/week Treatment Duration 1 week     02/06/2022   6:25 PM Oral Phase Oral Phase Impaired Oral - Mech Soft Impaired mastication Oral - Multi-Consistency NT Oral - Pill NT    02/06/2022   6:25 PM Pharyngeal Phase Pharyngeal Phase Impaired Pharyngeal- Nectar Teaspoon NT Pharyngeal-  Nectar Cup Delayed swallow initiation-vallecula Pharyngeal Material does not enter airway Pharyngeal- Nectar Straw NT Pharyngeal- Thin Teaspoon NT Pharyngeal- Thin Cup Delayed swallow initiation-vallecula;Reduced anterior laryngeal mobility;Reduced airway/laryngeal closure Pharyngeal Material enters airway, remains ABOVE vocal cords then ejected out;Material enters airway, passes BELOW cords and not ejected out despite cough attempt by patient Pharyngeal- Puree Delayed swallow initiation-vallecula Pharyngeal Material does not enter airway Pharyngeal- Mechanical Soft Delayed swallow initiation-vallecula Pharyngeal Material does not enter airway Pharyngeal- Regular NT Pharyngeal- Multi-consistency NT Pharyngeal- Pill NT    02/06/2022   6:25 PM Cervical Esophageal Phase  Cervical Esophageal Phase [facility] Elvina Sidle, M.S., CCC-SLP 02/06/2022, 6:50 PM                       Scheduled Meds:   vitamin C  500 mg Oral Daily   bisacodyl  10 mg Rectal BID   Chlorhexidine Gluconate Cloth  6 each Topical Daily   cyanocobalamin  1,000 mcg Intramuscular Q7 days   docusate sodium  100 mg Oral BID   feeding supplement  237 mL Oral TID WC & HS   ferrous sulfate  325 mg Oral BID WC   folic acid  1 mg Oral Daily   guaiFENesin  600 mg Oral BID   heparin injection (subcutaneous)  5,000 Units Subcutaneous Q8H   insulin aspart  0-15 Units Subcutaneous Q4H   lactulose  30 g Oral TID   linaclotide  145 mcg Oral QAC breakfast   LORazepam  1 mg Intravenous BID   metoCLOPramide (REGLAN) injection  5 mg Intravenous Q6H   metoprolol tartrate  25 mg Oral BID   multivitamin with minerals  1 tablet Oral QHS   mouth rinse  15 mL Mouth Rinse 4 times per day   pantoprazole  40 mg Oral QHS   revefenacin  175 mcg Nebulization Daily   simethicone  40 mg Oral TID AC & HS   sodium chloride flush  10-40 mL Intracatheter Q12H   tamsulosin  0.4 mg Oral Daily   vitamin A  50,000 Units Intramuscular Daily   Followed by   Derrill Memo ON  02/11/2022] vitamin A  10,000 Units Oral Daily   Vitamin D (Ergocalciferol)  50,000 Units Oral Q7 days   Continuous Infusions:  sodium chloride Stopped (01/31/22 1226)     LOS: 26 days   Time spent= 35 mins    Lala Lund, MD [facility]  If 7PM-7AM, please contact night-coverage  02/07/2022, 9:18 AM

## 2022-02-07 NOTE — Progress Notes (Signed)
Physical Therapy Treatment Patient Details Name: [name] MRN: [id number] DOB: 18-Jun-1966 Today's Date: 02/07/2022   History of Present Illness 56 y.o. female admitted 01/12/22 after suicide attempt with intentional promethazine OD. EEG without seizures; showed evidence of epileptogenicity. ETT 7/23-7/25. Reintubated 7/26. Ileus. 7/30 extubated but then trached and returned to vent, now on trach collar. PMHx:Afib, COPD, DM, neuropathy, HTN, bipolar disorder, schizophrenia, anxiety, depression, ETOH abuse.    PT Comments    Pt making good progress.  She is ambulating in hallway 400' and RW and was able to ambulate in room without AD.  Pt participating in standing balance activities.  She was on 5 L 28% FiO2 trach collar with PMSV during session with VSS.  Does still have decreased safety awareness but improving. Did update recommendations to [facility] level based on current mobility level.     Recommendations for follow up therapy are one component of a multi-disciplinary discharge planning process, led by the attending physician.  Recommendations may be updated based on patient status, additional functional criteria and insurance authorization.  Follow Up Recommendations  Home health PT Can patient physically be transported by private vehicle: Yes   Assistance Recommended at Discharge Intermittent Supervision/Assistance   Patient can return home with the following A little help with walking and/or transfers;Assistance with cooking/housework;Direct supervision/assist for medications management;Assist for transportation;A little help with bathing/dressing/bathroom;Direct supervision/assist for financial management;Help with stairs or ramp for entrance   Equipment Recommendations  Rolling walker (2 wheels);BSC/3in1    Recommendations for Other Services       Precautions / Restrictions Precautions Precautions: Fall;Other (comment) Precaution Comments: trach, watch sats     Mobility  Bed  Mobility Overal bed mobility: Needs Assistance Bed Mobility: Supine to Sit, Sit to Supine     Supine to sit: Supervision Sit to supine: Supervision   General bed mobility comments: supervision for lines    Transfers Overall transfer level: Needs assistance Equipment used: Rolling walker (2 wheels), None Transfers: Sit to/from Stand Sit to Stand: Supervision           General transfer comment: Performed x 1 with RW and x 10 without; supervision for lines    Ambulation/Gait Ambulation/Gait assistance: Supervision Gait Distance (Feet): 400 Feet Assistive device: Rolling walker (2 wheels), None Gait Pattern/deviations: Step-through pattern, Decreased stride length Gait velocity: decreased     General Gait Details: 61' with RW and 10' in room without ; did well with supervision for lines; min cues for energy monitoring   Stairs             Wheelchair Mobility    Modified Rankin (Stroke Patients Only)       Balance Overall balance assessment: Needs assistance Sitting-balance support: No upper extremity supported, Feet supported Sitting balance-Leahy Scale: Good     Standing balance support: No upper extremity supported Standing balance-Leahy Scale: Good Standing balance comment: Ambulated in room without AD - used in hallway for energy conservation               High Level Balance Comments: Worked on standing exercises (see below) and reaching outside BOS without LOB            Cognition Arousal/Alertness: Awake/alert Behavior During Therapy: Flat affect, Impulsive Overall Cognitive Status: Impaired/Different from baseline                           Safety/Judgement: Decreased awareness of safety   Problem Solving: Requires verbal cues  Exercises General Exercises - Lower Extremity Hip Flexion/Marching: AROM, 10 reps, Standing, Both Heel Raises: AROM, Both, Standing, 10 reps Other Exercises Other Exercises: 10 x  STS Other Exercises: all without RW, close supervision for safety    General Comments General comments (skin integrity, edema, etc.): Pt on 5 L 28% FiO2 trach collar with sats 97%.  PMSV in place      Pertinent Vitals/Pain Pain Assessment Pain Assessment: No/denies pain    Home Living                          Prior Function            PT Goals (current goals can now be found in the care plan section) Progress towards PT goals: Progressing toward goals    Frequency    Min 3X/week      PT Plan Discharge plan needs to be updated    Co-evaluation              AM-PAC PT "6 Clicks" Mobility   Outcome Measure  Help needed turning from your back to your side while in a flat bed without using bedrails?: A Little Help needed moving from lying on your back to sitting on the side of a flat bed without using bedrails?: A Little Help needed moving to and from a bed to a chair (including a wheelchair)?: A Little Help needed standing up from a chair using your arms (e.g., wheelchair or bedside chair)?: A Little Help needed to walk in hospital room?: A Little Help needed climbing 3-5 steps with a railing? : A Little 6 Click Score: 18    End of Session Equipment Utilized During Treatment: Oxygen;Gait belt Activity Tolerance: Patient tolerated treatment well Patient left: with call bell/phone within reach;with family/visitor present;in bed;with nursing/sitter in room Nurse Communication: Mobility status PT Visit Diagnosis: Other abnormalities of gait and mobility (R26.89);Difficulty in walking, not elsewhere classified (R26.2);Muscle weakness (generalized) (M62.81)     Time: 1610-9604 PT Time Calculation (min) (ACUTE ONLY): 20 min  Charges:  $Gait Training: 8-22 mins                     Abran Richard, PT Acute Rehab [facility] 02/07/2022, 2:05 PM

## 2022-02-07 NOTE — Progress Notes (Signed)
Pulled out trache collar , claimed "I can breath on my own.' Explained to use the oxygen but refused. Pulse ox - 97 % on room air. Messaged CCM  Dr Tamala Julian with no return, respiratory therapist  and Ghimire covering for DR Candiss Norse made aware . Continue to monitor.

## 2022-02-08 DIAGNOSIS — T50902A Poisoning by unspecified drugs, medicaments and biological substances, intentional self-harm, initial encounter: Secondary | ICD-10-CM | POA: Diagnosis not present

## 2022-02-08 LAB — COMPREHENSIVE METABOLIC PANEL
ALT: 61 U/L — ABNORMAL HIGH (ref 0–44)
AST: 59 U/L — ABNORMAL HIGH (ref 15–41)
Albumin: 1.8 g/dL — ABNORMAL LOW (ref 3.5–5.0)
Alkaline Phosphatase: 110 U/L (ref 38–126)
Anion gap: 4 — ABNORMAL LOW (ref 5–15)
BUN: 5 mg/dL — ABNORMAL LOW (ref 6–20)
CO2: 27 mmol/L (ref 22–32)
Calcium: 7.7 mg/dL — ABNORMAL LOW (ref 8.9–10.3)
Chloride: 105 mmol/L (ref 98–111)
Creatinine, Ser: 0.41 mg/dL — ABNORMAL LOW (ref 0.44–1.00)
GFR, Estimated: >60 mL/min (ref >60)
Glucose, Bld: 264 mg/dL — ABNORMAL HIGH (ref 70–99)
Potassium: 3.8 mmol/L (ref 3.5–5.1)
Sodium: 136 mmol/L (ref 135–145)
Total Bilirubin: 0.4 mg/dL (ref 0.3–1.2)
Total Protein: 6.4 g/dL — ABNORMAL LOW (ref 6.5–8.1)

## 2022-02-08 LAB — GLUCOSE, CAPILLARY
Glucose-Capillary: 112 mg/dL — ABNORMAL HIGH (ref 70–99)
Glucose-Capillary: 224 mg/dL — ABNORMAL HIGH (ref 70–99)
Glucose-Capillary: 112 mg/dL — ABNORMAL HIGH (ref 70–99)
Glucose-Capillary: 222 mg/dL — ABNORMAL HIGH (ref 70–99)

## 2022-02-08 LAB — CBC WITH DIFFERENTIAL/PLATELET
Abs Immature Granulocytes: 0 10*3/uL (ref 0.00–0.07)
Basophils Absolute: 0.1 10*3/uL (ref 0.0–0.1)
Basophils Relative: 1 %
Eosinophils Absolute: 0.1 10*3/uL (ref 0.0–0.5)
Eosinophils Relative: 1 %
HCT: 27.2 % — ABNORMAL LOW (ref 36.0–46.0)
Hemoglobin: 8 g/dL — ABNORMAL LOW (ref 12.0–15.0)
Lymphocytes Relative: 14 %
Lymphs Abs: 1 10*3/uL (ref 0.7–4.0)
MCH: 22.3 pg — ABNORMAL LOW (ref 26.0–34.0)
MCHC: 29.4 g/dL — ABNORMAL LOW (ref 30.0–36.0)
MCV: 76 fL — ABNORMAL LOW (ref 80.0–100.0)
Monocytes Absolute: 0.3 10*3/uL (ref 0.1–1.0)
Monocytes Relative: 4 %
Neutro Abs: 5.4 10*3/uL (ref 1.7–7.7)
Neutrophils Relative %: 80 %
Platelets: 307 10*3/uL (ref 150–400)
RBC: 3.58 MIL/uL — ABNORMAL LOW (ref 3.87–5.11)
RDW: 32.5 % — ABNORMAL HIGH (ref 11.5–15.5)
WBC: 6.8 10*3/uL (ref 4.0–10.5)
nRBC: 0 % (ref 0.0–0.2)
nRBC: 0 /100 WBC

## 2022-02-08 MED ORDER — HALOPERIDOL LACTATE 5 MG/ML IJ SOLN
2.0000 mg | Freq: Four times a day (QID) | INTRAMUSCULAR | Status: DC | PRN
  Administered 2022-02-08 (×2): 2 mg via INTRAMUSCULAR
  Filled 2022-02-08 (×3): qty 1

## 2022-02-08 MED ORDER — ZIPRASIDONE MESYLATE 20 MG IM SOLR
20.0000 mg | Freq: Once | INTRAMUSCULAR | Status: AC
Start: 2022-02-08 — End: 2022-02-08
  Administered 2022-02-08: 20 mg via INTRAMUSCULAR
  Filled 2022-02-08: qty 20

## 2022-02-08 MED ORDER — METOCLOPRAMIDE HCL 5 MG PO TABS
5.0000 mg | ORAL_TABLET | Freq: Three times a day (TID) | ORAL | Status: DC
  Administered 2022-02-08 – 2022-02-12 (×12): 5 mg via ORAL
  Filled 2022-02-08 (×13): qty 1

## 2022-02-08 NOTE — Progress Notes (Signed)
Pt refusing any care from this rt.

## 2022-02-08 NOTE — Progress Notes (Signed)
PROGRESS NOTE    [name]  WSF:[id number] DOB: [birth year] DOA: 01/12/2022 PCP: [name], MD   Brief Narrative:  56 year old with schizoaffective disorder, depression, polysubstance abuse, COPD, HTN, DM2 for suicide attempt and overdosing on promethazine and alcohol.  Initially patient was intubated due to GCS of 3 complicated by aspiration pneumonia.  Eventually when trach placement on 7/30 and transition to trach collar 7/31.  Other complicated issues that she developed ileus, urinary retention with intermittent fluid overload.  Ongoing trach management by PCCM, GI consulted for colonic ileus.  Current events and procedures 7/23 ED after etoh consumption and intentional promethazine overdose. Intubated. Admit to ICU  7/24 Tachycardic to 130s overnight, also with episode of whole body stiffening, ?dystonia vs. seizure. EEG with irritability, LTM initiated. Neuro consulted. 7/25 Extubated, intermittently somnolent/agitated, not managing secretions well. O2 sats stable on [place]. Cortrak difficult to flush. 7/26 Reintubated early AM for hypoxia, airway protection. Unasyn initiated for ?aspiration PNA. LTM EEG discontinued. MRI pending. 7/27 MRI no acute changes. Wakes up follows commands on .7 dex  7/28 off NE  01/19/2022>> Trach after goals of care discussion with daughter 7/31 Transitioned to ATC.  8/2 CT abdomen: Stool in right colon.  Normal appendix.  Placed gastric bypass surgery.  Nasogastric tube in stomach.  No evidence of wall thickening or obstruction and bowel.  Questionable cystitis.  Bibasilar pleural effusions.  Ceftriaxone started. 01/28/2022.  Transferred to hospitalist service on day 16 of her hospital stay    Assessment & Plan:   Acute hypoxic respiratory failure secondary to aspiration, Status post tracheostomy - Tracheostomy placed due to recurrent intubation.  Has 6 mm cuffed [place].  Routine trach care.  IV Lasix repeated x 2  on 02/02/2022  due to fluid overload, also antibiotics were commenced by previous team on 01/31/2022.  Monitor.  Continue trach collar, pulmonary toiletry with trach care.  Monitor closely.  Have requested pulmonary to monitor for trach site care and to evaluate if patient is a candidate for decannulation.   Alcohol and promethazine overdose with likely attempted suicide with history of schizoaffective disorder bipolar type, history of polysubstance abuse.  Seen by psych, currently stable continue to monitor.  For now on supportive care.  On as needed Ativan and Haldol.  Psych team quested to monitor daily to manage extreme anxiety and psych issues.  Per psych she is under IVC and awaits psych bed placement once medically stable i.e  decannulated.   ?? History of seizure disorder.  Currently on as needed Ativan.  EEG here nonacute.  Seen by neurology initially was on Keppra this was discontinued by neurology on 01/19/2022.   Colonic dysmotility with obstipation and some intermittent urinary retention.  On bowel regimen with bowel movements now, abdominal x-ray nonacute, some suprapubic fullness, was toward bladder scans were positive for over 300 cc of retention, received Foley catheter.  Start Flomax on 02/01/2022.  Monitor.  Urinary retention.  On Foley and Flomax, trial of Foley removal on 02/06/2022.   Microcytic anemia.  Check anemia panel, type screen.  Received IV iron, placed on ferrous sulfate along with Folex acid, family and patient agreeable for transfusion if needed.   Moderate protein calorie malnutrition.  On oral diet , DW daughter - patient was a small eater to begin with, as patient is developing leukocytosis and recurrent fluid overload I am going to discontinue TNA for the next few days starting on 02/02/2022 and monitor.  Her chances of developing a blood-borne infection is  very high.  Discussed with daughter.  She is agreeable with the plan as well.  Continue oral protein supplements.   History of  tobacco abuse and COPD.  No acute issues counseled to quit tobacco and alcohol.   GERD.  On PPI.     DM type II.  On long-acting insulin along with sliding scale.  CBG (last 3)  Recent Labs    02/07/22 2301 02/08/22 0617 02/08/22 1100  GLUCAP 207* 112* 224*   Lab Results  Component Value Date   HGBA1C 7.4 (H) 11/14/2021     DVT prophylaxis: Subcu heparin Code Status: Full  Family Communication:    Daughter [name] [phone] on 02/02/2022, 02/03/22, 02/05/2022, 02/07/2022  Status is: Inpatient Remains inpatient appropriate because: On going eval for fevers and abd pain   Nutritional status  Signs/Symptoms: severe fat depletion, severe muscle depletion  Interventions: Refer to RD note for recommendations, Ensure Enlive (each supplement provides 350kcal and 20 grams of protein), MVI  Body mass index is 20.62 kg/m.    Subjective:  Patient in bed, appears comfortable, denies any headache, no fever, no chest pain or pressure, no shortness of breath , no abdominal pain. No new focal weakness.   Examination:  Awake Alert, No new F.N deficits, tracheostomy in place with trach collar, R. Arm PICC, Foley anxious affect [place].AT,PERRAL Supple Neck, No JVD,   Symmetrical Chest wall movement, Good air movement bilaterally, CTAB RRR,No Gallops, Rubs or new Murmurs,  +ve B.Sounds, Abd Soft, No tenderness,   No Cyanosis, Clubbing or edema    Objective: Vitals:   02/08/22 0250 02/08/22 0358 02/08/22 0725 02/08/22 0830  BP: 116/77   118/75  Pulse: 83 81 (!) 108 94  Resp: (!) 22 (!) 24 (!) 28 20  Temp: 98.2 F (36.8 C)   98.7 F (37.1 C)  TempSrc: Axillary   Axillary  SpO2: 97% 97% 97%   Weight: 47.9 kg     Height:        Intake/Output Summary (Last 24 hours) at 02/08/2022 1102 Last data filed at 02/08/2022 1000 Gross per 24 hour  Intake 1230 ml  Output 1801 ml  Net -571 ml   Filed Weights   02/06/22 0341 02/07/22 0252 02/08/22 0250  Weight: 40.1 kg 47.7 kg 47.9  kg     Data Reviewed:   Recent Labs  Lab 02/04/22 0500 02/05/22 0706 02/06/22 0032 02/07/22 0030 02/08/22 0015  WBC 9.7 9.6 10.5 8.2 6.8  HGB 7.5* 8.2* 7.4* 7.9* 8.0*  HCT 24.9* 27.4* 24.7* 26.9* 27.2*  PLT PLATELET CLUMPS NOTED ON SMEAR, COUNT APPEARS ADEQUATE 344 303 334 307  MCV 72.0* 73.9* 73.7* 73.9* 76.0*  MCH 21.7* 22.1* 22.1* 21.7* 22.3*  MCHC 30.1 29.9* 30.0 29.4* 29.4*  RDW 30.6* 31.2* 30.8* 31.9* 32.5*  LYMPHSABS 2.5 2.4 1.2 2.4 1.0  MONOABS 0.7 0.6 0.2 0.7 0.3  EOSABS 0.1 0.1 0.2 0.1 0.1  BASOSABS 0.0 0.1 0.1 0.0 0.1    Recent Labs  Lab 02/02/22 0316 02/03/22 0430 02/04/22 0500 02/05/22 0706 02/06/22 0032 02/07/22 0030 02/08/22 0015  NA 135 134* 137 134* 136 137 136  K 4.3 4.5 3.8 3.8 4.2 4.7 3.8  CL 105 103 105 102 103 104 105  CO2 '24 26 27 26 28 29 27  '$ GLUCOSE 66* 77 113* 130* 157* 65* 264*  BUN 23* '19 11 6 10 6 '$ 5*  CREATININE 0.42* 0.39* <0.30* 0.32* 0.37* 0.42* 0.41*  CALCIUM 8.5* 8.0* 7.9* 7.8* 7.5* 7.9* 7.7*  AST  --   --  50* 36 33 29 59*  ALT  --   --  81* 70* 63* 53* 61*  ALKPHOS  --   --  142* 132* 122 111 110  BILITOT  --   --  0.2* 0.5 0.5 0.3 0.4  ALBUMIN  --   --  1.5* 1.6* 1.6* 1.8* 1.8*  MG 2.2  --  1.8 1.6* 2.0 2.0  --   PHOS  --  3.4  --   --   --   --   --   PROCALCITON 0.72 0.54 0.20 0.12 <0.10 <0.10  --   BNP 82.1 60.4 58.2 74.0  --   --   --      Radiology Studies: DG Swallowing Func-Speech Pathology  Result Date: 02/06/2022 Table formatting from the original result was not included. Objective Swallowing Evaluation: Type of Study: MBS-Modified Barium Swallow Study  Patient Details Name: [name] MRN: [id number] Date of Birth: [birth year] Today's Date: 02/06/2022 Time: SLP Start Time (ACUTE ONLY): 100 -SLP Stop Time (ACUTE ONLY): 1045 SLP Time Calculation (min) (ACUTE ONLY): 15 min Past Medical History: Past Medical History: Diagnosis Date  Abdominal pain   Accidental drug overdose April 2013  Anxiety   Atrial fibrillation  ([place]) 09/29/11  converted spontaneously  Chronic back pain   Chronic knee pain   Chronic nausea   Chronic pain   COPD (chronic obstructive pulmonary disease) ([place])   Depression   Diabetes mellitus   states her doctor took her off all DM meds in past month  Diabetic neuropathy ([place])   Dyspnea   with exertion   GERD (gastroesophageal reflux disease)   Headache(784.0)   migraines   HTN (hypertension)   not on meds since in a year   Hyperlipidemia   Hypothyroidism   not on meds in a while   Mental disorder   Bipolar and schizophrenic  Requires supplemental oxygen   as needed per patient   Schizophrenia ([place])   Schizophrenia, acute ([place]) 11/13/2017  Tobacco abuse  Past Surgical History: Past Surgical History: Procedure Laterality Date  ABDOMINAL HYSTERECTOMY    BLADDER SUSPENSION  03/04/2011  Procedure: [facility] PROCEDURE;  Surgeon: Elayne Snare MacDiarmid;  Location: [facility];  Service: Urology;  Laterality: N/A;  CYSTOCELE REPAIR  03/04/2011  Procedure: ANTERIOR REPAIR (CYSTOCELE);  Surgeon: Reece Packer;  Location: [facility];  Service: Urology;  Laterality: N/A;  CYSTOSCOPY  03/04/2011  Procedure: CYSTOSCOPY;  Surgeon: Elayne Snare MacDiarmid;  Location: [facility];  Service: Urology;  Laterality: N/A;  ESOPHAGOGASTRODUODENOSCOPY (EGD) WITH PROPOFOL N/A 05/12/2017  Procedure: ESOPHAGOGASTRODUODENOSCOPY (EGD) WITH PROPOFOL;  Surgeon: Alphonsa Overall, MD;  Location: [facility];  Service: General;  Laterality: N/A;  GASTRIC ROUX-EN-Y N/A 03/25/2016  Procedure: LAPAROSCOPIC ROUX-EN-Y GASTRIC BYPASS WITH UPPER ENDOSCOPY;  Surgeon: Excell Seltzer, MD;  Location: [facility];  Service: General;  Laterality: N/A;  KNEE SURGERY    LAPAROSCOPIC ASSISTED VAGINAL HYSTERECTOMY  03/04/2011  Procedure: LAPAROSCOPIC ASSISTED VAGINAL HYSTERECTOMY;  Surgeon: Cyril Mourning, MD;  Location: [facility];  Service: Gynecology;  Laterality: N/A; HPI: 56 yo F PMH schizoaffective disorder (bipolar type), Depression, prior SI, polysubstance use disorder, COPD, HTN, DM  presented to the ED on 01/12/22 following suicide attempt. Patient had been drinking, while with friends stated that she was going to kill herself and was witnessed ingesting a bottle of promethazine and alcohol overdose.   In transport with EMS became more somnolent. Arrived to ED with GCS 3 and was  intubated in this setting admitted by pulmonary critical care, debated but had problems with aspiration pneumonia, underwent tracheostomy placement on 01/19/2022.  Transition to trach collar on 01/20/2022.  She currently has issues with resolving ileus, intermittent urinary retention, recently treated UTI, intermittent fluid overload.  Pt being seen by ST for PMV tx and dysphagia tx.  Currently on Dysphagia 2/nectar-thick liquids.  MBS ordered to progress diet as able.  Subjective: "I want that cracker"  Recommendations for follow up therapy are one component of a multi-disciplinary discharge planning process, led by the attending physician.  Recommendations may be updated based on patient status, additional functional criteria and insurance authorization. Assessment / Plan / Recommendation   02/06/2022   6:25 PM Clinical Impressions Clinical Impression Study completed with PMV donned and pt exhibiting oropharyngeal dysphagia characterized by impaired mastication and improved bolus cohesion with solids.  A pharyngeal delay noted overall with premature spillage to the level of the valleculae.  Decreased anterior laryngeal movement noted resulting in deep penetration with eventual aspiration secondary to decreased airway closure with larger amounts of thin liquids.  Recommend possible upgrade to mechanical soft (Dysphagia 3) with nectar-thickened liquids with meals with continued ST to strengthen/coordinate musculature/structures to improve overall swallow efficiency/accuracy. SLP Visit Diagnosis Dysphagia, oropharyngeal phase (R13.12) Impact on safety and function Mild aspiration risk;Moderate aspiration risk     02/06/2022    6:25 PM Treatment Recommendations Treatment Recommendations Therapy as outlined in treatment plan below     02/06/2022   6:25 PM Prognosis Prognosis for Safe Diet Advancement Good Barriers to Reach Goals Behavior   02/06/2022   6:25 PM Diet Recommendations SLP Diet Recommendations Dysphagia 3 (Mech soft) solids;Nectar thick liquid;Other (Comment) Liquid Administration via Cup;No straw Medication Administration Other (Comment) Compensations Slow rate;Small sips/bites;Multiple dry swallows after each bite/sip Postural Changes Seated upright at 90 degrees     02/06/2022   6:25 PM Other Recommendations Oral Care Recommendations Oral care BID;Staff/trained caregiver to provide oral care Follow Up Recommendations Follow physician's recommendations for discharge plan and follow up therapies Assistance recommended at discharge Frequent or constant Supervision/Assistance Functional Status Assessment Patient has had a recent decline in their functional status and demonstrates the ability to make significant improvements in function in a reasonable and predictable amount of time.   02/06/2022   6:25 PM Frequency and Duration  Speech Therapy Frequency (ACUTE ONLY) min 2x/week Treatment Duration 1 week     02/06/2022   6:25 PM Oral Phase Oral Phase Impaired Oral - Mech Soft Impaired mastication Oral - Multi-Consistency NT Oral - Pill NT    02/06/2022   6:25 PM Pharyngeal Phase Pharyngeal Phase Impaired Pharyngeal- Nectar Teaspoon NT Pharyngeal- Nectar Cup Delayed swallow initiation-vallecula Pharyngeal Material does not enter airway Pharyngeal- Nectar Straw NT Pharyngeal- Thin Teaspoon NT Pharyngeal- Thin Cup Delayed swallow initiation-vallecula;Reduced anterior laryngeal mobility;Reduced airway/laryngeal closure Pharyngeal Material enters airway, remains ABOVE vocal cords then ejected out;Material enters airway, passes BELOW cords and not ejected out despite cough attempt by patient Pharyngeal- Puree Delayed swallow  initiation-vallecula Pharyngeal Material does not enter airway Pharyngeal- Mechanical Soft Delayed swallow initiation-vallecula Pharyngeal Material does not enter airway Pharyngeal- Regular NT Pharyngeal- Multi-consistency NT Pharyngeal- Pill NT    02/06/2022   6:25 PM Cervical Esophageal Phase  Cervical Esophageal Phase [facility] Elvina Sidle, M.S., CCC-SLP 02/06/2022, 6:50 PM                       Scheduled Meds:   ARIPiprazole  5 mg Oral  Daily   vitamin C  500 mg Oral Daily   bisacodyl  10 mg Rectal BID   Chlorhexidine Gluconate Cloth  6 each Topical Daily   cyanocobalamin  1,000 mcg Intramuscular Q7 days   docusate sodium  100 mg Oral BID   feeding supplement  237 mL Oral TID WC & HS   ferrous sulfate  325 mg Oral BID WC   folic acid  1 mg Oral Daily   guaiFENesin  600 mg Oral BID   heparin injection (subcutaneous)  5,000 Units Subcutaneous Q8H   insulin aspart  0-15 Units Subcutaneous TID WC   lactulose  30 g Oral TID   linaclotide  145 mcg Oral QAC breakfast   LORazepam  1 mg Intravenous BID   metoCLOPramide  5 mg Oral TID AC   metoprolol tartrate  25 mg Oral BID   multivitamin with minerals  1 tablet Oral QHS   mouth rinse  15 mL Mouth Rinse 4 times per day   pantoprazole  40 mg Oral QHS   revefenacin  175 mcg Nebulization Daily   simethicone  40 mg Oral TID AC & HS   sodium chloride flush  10-40 mL Intracatheter Q12H   tamsulosin  0.4 mg Oral Daily   vitamin A  50,000 Units Intramuscular Daily   Followed by   Derrill Memo ON 02/11/2022] vitamin A  10,000 Units Oral Daily   Vitamin D (Ergocalciferol)  50,000 Units Oral Q7 days   Continuous Infusions:  sodium chloride Stopped (01/31/22 1226)     LOS: 27 days   Time spent= 35 mins    Lala Lund, MD [facility]  If 7PM-7AM, please contact night-coverage  02/08/2022, 11:02 AM

## 2022-02-08 NOTE — Progress Notes (Signed)
  X-cover Note: Pt continues with nightly agitation. Will give 20 mg IM geodon   Kristopher Oppenheim, DO [facility]

## 2022-02-08 NOTE — Progress Notes (Signed)
Pt is yelling in the hallway and threatening to throw objects at staff. Pt is pacing back and forth in the hallways and cannot stay in room. Haldol 0.31m given at 19:56pm. Day RN stated Ativan '1mg'$  was given at 10:48am and was not helpful with pt sxs. Paged MD awaiting order.

## 2022-02-08 NOTE — TOC Progression Note (Addendum)
Transition of Care [facility]) - Progression Note    Patient Details  Name: ARBUTUS NELLIGAN MRN: [id number] Date of Birth: [birth year]  Transition [facility] CM/SW [place], LCSW Phone Number:[phone] 02/08/2022, 2:31 PM  Clinical Narrative:     CSW noted that IVC paperwork has expired. CSW reached out to MD to ascertain if pt's IVC paperwork needed to be continued as well as psychiatry noting for it to remain in effect.. CSW faxed renewed IVC paperwork to magistrates office.  2:05PM- CSW called magistrates office to ascertain if paperwork was received. CSW was informed that they have been having issues with their fax machine and to call back.  2:28 PM: CSW called magistrates office back and they stated they just received fax. They explained that they will attempt send it back however fax machine is not working well.  3:40 PM- IVC paperwork delivered by [employer] and filled out. Paperwork on chart.  TOC team will continue to assist with discharge planning needs.   Expected Discharge Plan:  (inpatient psych) Barriers to Discharge: Continued Medical Work up  Expected Discharge Plan and Services Expected Discharge Plan:  (inpatient psych) In-house Referral: Clinical Social Work Discharge Planning Services: CM Consult   Living arrangements for the past 2 months: [place] Determinants of Health (SDOH) Interventions    Readmission Risk Interventions    07/27/2020   11:06 AM  Readmission Risk Prevention Plan  Transportation Screening Complete  PCP or Specialist Appt within 5-7 Days Complete  Home Care Screening Complete

## 2022-02-08 NOTE — Progress Notes (Signed)
Brief PCCM Note:  Spoke with RT. Capping trial did not occur last week due to secretions which are better at this time. Plan will be to move forward with cap trial and possible decannulation tomorrow.  Freda Jackson, MD [facility] Office: [phone]   See Amion for personal pager PCCM on call pager [phone] until 7pm. Please call Elink 7p-7a. [phone]

## 2022-02-09 DIAGNOSIS — T50902A Poisoning by unspecified drugs, medicaments and biological substances, intentional self-harm, initial encounter: Secondary | ICD-10-CM | POA: Diagnosis not present

## 2022-02-09 DIAGNOSIS — F250 Schizoaffective disorder, bipolar type: Secondary | ICD-10-CM

## 2022-02-09 LAB — GLUCOSE, CAPILLARY
Glucose-Capillary: 142 mg/dL — ABNORMAL HIGH (ref 70–99)
Glucose-Capillary: 112 mg/dL — ABNORMAL HIGH (ref 70–99)
Glucose-Capillary: 240 mg/dL — ABNORMAL HIGH (ref 70–99)
Glucose-Capillary: 166 mg/dL — ABNORMAL HIGH (ref 70–99)

## 2022-02-09 MED ORDER — ZIPRASIDONE HCL 40 MG PO CAPS
40.0000 mg | ORAL_CAPSULE | Freq: Once | ORAL | Status: AC
  Administered 2022-02-09: 40 mg via ORAL
  Filled 2022-02-09: qty 1

## 2022-02-09 MED ORDER — GABAPENTIN 300 MG PO CAPS
300.0000 mg | ORAL_CAPSULE | Freq: Two times a day (BID) | ORAL | Status: DC
  Administered 2022-02-09 – 2022-02-10 (×3): 300 mg via ORAL
  Filled 2022-02-09 (×3): qty 1

## 2022-02-09 MED ORDER — ZIPRASIDONE MESYLATE 20 MG IM SOLR: 20.0000 mg | INTRAMUSCULAR | Status: DC | PRN

## 2022-02-09 MED ORDER — ZIPRASIDONE MESYLATE 20 MG IM SOLR
20.0000 mg | Freq: Four times a day (QID) | INTRAMUSCULAR | Status: DC | PRN
  Administered 2022-02-09 – 2022-02-10 (×4): 20 mg via INTRAMUSCULAR
  Filled 2022-02-09 (×5): qty 20

## 2022-02-09 MED ORDER — ARIPIPRAZOLE 5 MG PO TABS
5.0000 mg | ORAL_TABLET | Freq: Two times a day (BID) | ORAL | Status: DC
  Administered 2022-02-09 – 2022-02-12 (×6): 5 mg via ORAL
  Filled 2022-02-09 (×7): qty 1

## 2022-02-09 MED ORDER — STERILE WATER FOR INJECTION IJ SOLN
INTRAMUSCULAR | Status: AC
  Filled 2022-02-09: qty 10

## 2022-02-09 NOTE — Progress Notes (Signed)
Pt awakens from her sleep and is restless and pacing back and forth down the hallway. She is complaining that her stomach hurts and she cannot sleep. Paged MD awaiting order.

## 2022-02-09 NOTE — Progress Notes (Signed)
Pt has had several episodes of agitation today, verbally threatening staff and she threw her walker in the hallway. She threatened RN this afternoon because we did not have the kind of soup she wanted, and she said if we did not get the soup "right now" that "I am going to throw this [her walker]." RN administered Geodon IM and pt is now resting. Safety sitter w/ pt. RN continuing to monitor.

## 2022-02-09 NOTE — Progress Notes (Signed)
Pt is becoming more agitated, pacing back and forth in hallway, and threatening to throw things at staff. Informed MD. MD ordered to give Geodon early to txt sxs.

## 2022-02-09 NOTE — Progress Notes (Signed)
PROGRESS NOTE    [name]  ERD:[id number] DOB: 07/21/1965 DOA: 01/12/2022 PCP: [name], MD   Brief Narrative:  56 year old with schizoaffective disorder, depression, polysubstance abuse, COPD, HTN, DM2 for suicide attempt and overdosing on promethazine and alcohol.  Initially patient was intubated due to GCS of 3 complicated by aspiration pneumonia.  Eventually when trach placement on 7/30 and transition to trach collar 7/31.  Other complicated issues that she developed ileus, urinary retention with intermittent fluid overload.  Ongoing trach management by PCCM, GI consulted for colonic ileus.  Current events and procedures 7/23 ED after etoh consumption and intentional promethazine overdose. Intubated. Admit to ICU  7/24 Tachycardic to 130s overnight, also with episode of whole body stiffening, ?dystonia vs. seizure. EEG with irritability, LTM initiated. Neuro consulted. 7/25 Extubated, intermittently somnolent/agitated, not managing secretions well. O2 sats stable on [place]. Cortrak difficult to flush. 7/26 Reintubated early AM for hypoxia, airway protection. Unasyn initiated for ?aspiration PNA. LTM EEG discontinued. MRI pending. 7/27 MRI no acute changes. Wakes up follows commands on .7 dex  7/28 off NE  01/19/2022>> Trach after goals of care discussion with daughter 7/31 Transitioned to ATC.  8/2 CT abdomen: Stool in right colon.  Normal appendix.  Placed gastric bypass surgery.  Nasogastric tube in stomach.  No evidence of wall thickening or obstruction and bowel.  Questionable cystitis.  Bibasilar pleural effusions.  Ceftriaxone started. 01/28/2022.  Transferred to hospitalist service on day 16 of her hospital stay    Assessment & Plan:   Acute hypoxic respiratory failure secondary to aspiration, Status post tracheostomy - Tracheostomy placed due to recurrent intubation.  Has 6 mm cuffed Shiley.  Routine trach care.  IV Lasix repeated x 2  on 02/02/2022  due to fluid overload, also antibiotics were commenced by previous team on 01/31/2022.  Monitor.  Continue trach collar, pulmonary toiletry with trach care.  Monitor closely.  Have requested pulmonary to monitor for trach site care and to evaluate if patient is a candidate for decannulation.   Alcohol and promethazine overdose with likely attempted suicide with history of schizoaffective disorder bipolar type, history of polysubstance abuse.  Seen by psych, currently stable continue to monitor.  For now on supportive care.  On as needed Ativan and Haldol.  Psych team quested to monitor daily to manage extreme anxiety and psych issues.  Per psych she is under IVC and awaits psych bed placement once medically stable i.e  decannulated.   ?? History of seizure disorder.  Currently on as needed Ativan.  EEG here nonacute.  Seen by neurology initially was on Keppra this was discontinued by neurology on 01/19/2022.   Colonic dysmotility with obstipation and some intermittent urinary retention.  On bowel regimen with bowel movements now, abdominal x-ray nonacute, some suprapubic fullness, was toward bladder scans were positive for over 300 cc of retention, received Foley catheter.  Start Flomax on 02/01/2022.  Monitor.  Urinary retention.  On Foley and Flomax, trial of Foley removal on 02/06/2022,  resolved.    Microcytic anemia.  Check anemia panel, type screen.  Received IV iron, placed on ferrous sulfate along with Folex acid, family and patient agreeable for transfusion if needed.   Moderate protein calorie malnutrition.  On oral diet , DW daughter - patient was a small eater to begin with, as patient is developing leukocytosis and recurrent fluid overload I am going to discontinue TNA for the next few days starting on 02/02/2022 and monitor.  Her chances of developing a  blood-borne infection is very high.  Discussed with daughter.  She is agreeable with the plan as well.  Continue oral protein supplements.    History of tobacco abuse and COPD.  No acute issues counseled to quit tobacco and alcohol.   GERD.  On PPI.     DM type II.  On long-acting insulin along with sliding scale.  CBG (last 3)  Recent Labs    02/08/22 1600 02/08/22 2104 02/09/22 0634  GLUCAP 112* 222* 142*   Lab Results  Component Value Date   HGBA1C 7.4 (H) 11/14/2021     DVT prophylaxis: Subcu heparin Code Status: Full  Family Communication:    Daughter [name] [phone] on 02/02/2022, 02/03/22, 02/05/2022, 02/07/2022  Status is: Inpatient Remains inpatient appropriate because: On going eval for fevers and abd pain   Nutritional status  Signs/Symptoms: severe fat depletion, severe muscle depletion  Interventions: Refer to RD note for recommendations, Ensure Enlive (each supplement provides 350kcal and 20 grams of protein), MVI  Body mass index is 20.19 kg/m.    Subjective:  Patient in bed, appears comfortable, denies any headache, no fever, no chest pain or pressure, no shortness of breath , no abdominal pain. No new focal weakness.   Examination:  Awake Alert, No new F.N deficits, tracheostomy in place with trach collar, R. Arm PICC, Foley anxious affect [place].AT,PERRAL Supple Neck, No JVD,   Symmetrical Chest wall movement, Good air movement bilaterally, CTAB RRR,No Gallops, Rubs or new Murmurs,  +ve B.Sounds, Abd Soft, No tenderness,   No Cyanosis, Clubbing or edema     Objective: Vitals:   02/09/22 0153 02/09/22 0337 02/09/22 0405 02/09/22 0814  BP:  109/69  109/69  Pulse:  89  91  Resp:    (!) 22  Temp:  98.2 F (36.8 C)    TempSrc:  Oral    SpO2:  97% 97% 97%  Weight: 46.9 kg     Height:        Intake/Output Summary (Last 24 hours) at 02/09/2022 0905 Last data filed at 02/09/2022 0401 Gross per 24 hour  Intake 1600 ml  Output 0 ml  Net 1600 ml   Filed Weights   02/07/22 0252 02/08/22 0250 02/09/22 0153  Weight: 47.7 kg 47.9 kg 46.9 kg     Data Reviewed:   Recent Labs   Lab 02/04/22 0500 02/05/22 0706 02/06/22 0032 02/07/22 0030 02/08/22 0015  WBC 9.7 9.6 10.5 8.2 6.8  HGB 7.5* 8.2* 7.4* 7.9* 8.0*  HCT 24.9* 27.4* 24.7* 26.9* 27.2*  PLT PLATELET CLUMPS NOTED ON SMEAR, COUNT APPEARS ADEQUATE 344 303 334 307  MCV 72.0* 73.9* 73.7* 73.9* 76.0*  MCH 21.7* 22.1* 22.1* 21.7* 22.3*  MCHC 30.1 29.9* 30.0 29.4* 29.4*  RDW 30.6* 31.2* 30.8* 31.9* 32.5*  LYMPHSABS 2.5 2.4 1.2 2.4 1.0  MONOABS 0.7 0.6 0.2 0.7 0.3  EOSABS 0.1 0.1 0.2 0.1 0.1  BASOSABS 0.0 0.1 0.1 0.0 0.1    Recent Labs  Lab 02/03/22 0430 02/04/22 0500 02/05/22 0706 02/06/22 0032 02/07/22 0030 02/08/22 0015  NA 134* 137 134* 136 137 136  K 4.5 3.8 3.8 4.2 4.7 3.8  CL 103 105 102 103 104 105  CO2 '26 27 26 28 29 27  '$ GLUCOSE 77 113* 130* 157* 65* 264*  BUN '19 11 6 10 6 '$ 5*  CREATININE 0.39* <0.30* 0.32* 0.37* 0.42* 0.41*  CALCIUM 8.0* 7.9* 7.8* 7.5* 7.9* 7.7*  AST  --  50* 36 33 29 59*  ALT  --  81* 70* 63* 53* 61*  ALKPHOS  --  142* 132* 122 111 110  BILITOT  --  0.2* 0.5 0.5 0.3 0.4  ALBUMIN  --  1.5* 1.6* 1.6* 1.8* 1.8*  MG  --  1.8 1.6* 2.0 2.0  --   PHOS 3.4  --   --   --   --   --   PROCALCITON 0.54 0.20 0.12 <0.10 <0.10  --   BNP 60.4 58.2 74.0  --   --   --      Radiology Studies: No results found.   Scheduled Meds:   ARIPiprazole  5 mg Oral Daily   vitamin C  500 mg Oral Daily   bisacodyl  10 mg Rectal BID   Chlorhexidine Gluconate Cloth  6 each Topical Daily   cyanocobalamin  1,000 mcg Intramuscular Q7 days   docusate sodium  100 mg Oral BID   feeding supplement  237 mL Oral TID WC & HS   ferrous sulfate  325 mg Oral BID WC   folic acid  1 mg Oral Daily   guaiFENesin  600 mg Oral BID   heparin injection (subcutaneous)  5,000 Units Subcutaneous Q8H   insulin aspart  0-15 Units Subcutaneous TID WC   lactulose  30 g Oral TID   linaclotide  145 mcg Oral QAC breakfast   LORazepam  1 mg Intravenous BID   metoCLOPramide  5 mg Oral TID AC   metoprolol tartrate   25 mg Oral BID   multivitamin with minerals  1 tablet Oral QHS   mouth rinse  15 mL Mouth Rinse 4 times per day   pantoprazole  40 mg Oral QHS   revefenacin  175 mcg Nebulization Daily   simethicone  40 mg Oral TID AC & HS   sodium chloride flush  10-40 mL Intracatheter Q12H   tamsulosin  0.4 mg Oral Daily   vitamin A  50,000 Units Intramuscular Daily   Followed by   Derrill Memo ON 02/11/2022] vitamin A  10,000 Units Oral Daily   Vitamin D (Ergocalciferol)  50,000 Units Oral Q7 days   Continuous Infusions:  sodium chloride Stopped (01/31/22 1226)     LOS: 28 days   Time spent= 35 mins    Lala Lund, MD [facility]  If 7PM-7AM, please contact night-coverage  02/09/2022, 9:05 AM

## 2022-02-09 NOTE — Plan of Care (Signed)
  Problem: Fluid Volume: Goal: Ability to maintain a balanced intake and output will improve Outcome: Progressing   Problem: Tissue Perfusion: Goal: Adequacy of tissue perfusion will improve Outcome: Progressing   Problem: Clinical Measurements: Goal: Cardiovascular complication will be avoided Outcome: Progressing   Problem: Activity: Goal: Risk for activity intolerance will decrease Outcome: Progressing   Problem: Elimination: Goal: Will not experience complications related to urinary retention Outcome: Progressing   Problem: Coping: Goal: Ability to adjust to condition or change in health will improve Outcome: Not Progressing   Problem: Coping: Goal: Level of anxiety will decrease Outcome: Not Progressing

## 2022-02-09 NOTE — Consult Note (Signed)
Progress note: 02/09/22  Subject: '' Severe agitation  Objective: Patient is 56 yo Female with PMH schizoaffective disorder (bipolar type), Depression, prior SI, polysubstance use disorder, COPD, HTN, DM presented to the ED after she attempted suicide by drinking alcohol and ingesting a bottle of promethazine. Per sitter, staff nurse, patient woke up this morning restless, agitated and was pacing the hallway back and forth. Also, they reports episodic agitations prior to this morning. Patient received Geodon with good effect. Unable to obtain further history from the patient, she is sleeping very calmly.   Plan/Recommendations: -Patient continues to meet inpatient psychiatric criteria and IVC. -Continue 1:1 sitter for safety -Increase Abilify to 5 mg twice daily for agitation -Add Gabapentin 300 mg twice daily for agitation/anxiety/alcohol withdrawal  -Continue Geodon 20 mg IM q6h as needed for agitation -EKG to monitor QTc interval, last obtained 08/04-QTc 436.  -Continue IVC papers as she remains a risk to self.   -Continue delirium precautions as directed.  -Psychiatric service will continue to follow patient to monitor her progress  Corena Pilgrim, MD Attending Psychiatrist

## 2022-02-10 DIAGNOSIS — T50902D Poisoning by unspecified drugs, medicaments and biological substances, intentional self-harm, subsequent encounter: Secondary | ICD-10-CM | POA: Diagnosis not present

## 2022-02-10 LAB — GLUCOSE, CAPILLARY
Glucose-Capillary: 145 mg/dL — ABNORMAL HIGH (ref 70–99)
Glucose-Capillary: 229 mg/dL — ABNORMAL HIGH (ref 70–99)
Glucose-Capillary: 162 mg/dL — ABNORMAL HIGH (ref 70–99)

## 2022-02-10 LAB — CK: Total CK: 60 U/L (ref 38–234)

## 2022-02-10 MED ORDER — ZIPRASIDONE HCL 20 MG PO CAPS
20.0000 mg | ORAL_CAPSULE | Freq: Two times a day (BID) | ORAL | Status: DC | PRN
  Administered 2022-02-10 – 2022-02-11 (×2): 20 mg via ORAL
  Filled 2022-02-10 (×4): qty 1

## 2022-02-10 MED ORDER — STERILE WATER FOR INJECTION IJ SOLN
INTRAMUSCULAR | Status: AC
  Filled 2022-02-10: qty 10

## 2022-02-10 MED ORDER — GABAPENTIN 300 MG PO CAPS
600.0000 mg | ORAL_CAPSULE | Freq: Every day | ORAL | Status: DC
  Administered 2022-02-10 – 2022-02-11 (×2): 600 mg via ORAL
  Filled 2022-02-10 (×2): qty 2

## 2022-02-10 MED ORDER — POTASSIUM CHLORIDE CRYS ER 20 MEQ PO TBCR
40.0000 meq | EXTENDED_RELEASE_TABLET | Freq: Once | ORAL | Status: AC
  Administered 2022-02-10: 40 meq via ORAL
  Filled 2022-02-10: qty 2

## 2022-02-10 MED ORDER — FUROSEMIDE 10 MG/ML IJ SOLN: 20.0000 mg | Freq: Once | INTRAMUSCULAR | Status: DC

## 2022-02-10 MED ORDER — FUROSEMIDE 10 MG/ML IJ SOLN
40.0000 mg | Freq: Once | INTRAMUSCULAR | Status: AC
  Administered 2022-02-10: 40 mg via INTRAVENOUS
  Filled 2022-02-10: qty 4

## 2022-02-10 MED ORDER — LORAZEPAM 2 MG/ML IJ SOLN: 1.0000 mg | Freq: Once | INTRAMUSCULAR | Status: DC

## 2022-02-10 MED ORDER — GABAPENTIN 300 MG PO CAPS
300.0000 mg | ORAL_CAPSULE | Freq: Every day | ORAL | Status: DC
  Administered 2022-02-11 – 2022-02-12 (×2): 300 mg via ORAL
  Filled 2022-02-10 (×2): qty 1

## 2022-02-10 NOTE — Progress Notes (Signed)
Mobility Specialist Progress Note    02/10/22 1649  Mobility  Activity Ambulated with assistance in hallway  Level of Assistance Contact guard assist, steadying assist  Assistive Device Other (Comment) (HHA)  Distance Ambulated (ft) 420 ft  Activity Response Tolerated well  $Mobility charge 1 Mobility   Pt received in hall and agreeable. No complaints on walk. Returned to standing in room w/ sitter.  Hildred Alamin Mobility Specialist

## 2022-02-10 NOTE — Consult Note (Signed)
[facility] Face-to-Face Psychiatry Consult   Reason for Consult: Severe agitation Referring Physician:  Dr. Candiss Norse  Patient Identification: [name] MRN:  [id number] Principal Diagnosis: Intentional overdose [facility]) Diagnosis:  Principal Problem:   Intentional overdose ([place]) Active Problems:   Hypokalemia   Protein-calorie malnutrition, severe   Ileus ([place])   Total Time spent with patient: 30 minutes .   Subjective:  [name] is a 56 y.o. female patient admitted with suicide attempt by overdose.  Patient was recently trached on July 30 of, Has since Been capped with decannulation expected.  Psychiatry consult service has been following during the duration of this hospital stay due to the context of her admission.  Patient has been developing worsening agitation and aggression over the past several days which has resulted in increase in IM medications and restraints.    Patient is seen and assessed face to face by this nurse practitioner, chart reviewed, family present at bedside.  Patient is alert and oriented x4, calm and cooperative, and pleasant.  Please note prior to this psychiatric reassessment patient did receive IM Geodon, per patient's request to premedicate before agitation.  Patient did arouse long enough to allow participation in psychiatric reassessment, she did engage to the best of her ability.  She also consented to physical examination to assess for aims and muscle rigidity.  Patient does admit to her increasing agitation, although she denies any recent stressors and or triggers, that are contributing to her worsening agitation and behaviors.  She further states she receives medication to help her sleep at night, and calm down.  Some of the questions she does answer with" I do not know. "  She does endorse recently asking for her Geodon, as she was not able to sleep last night.  Patient reports a good appetite, and states she eats about 1-2 plates each meal.  Patient does  request that interview be terminated, as she recently received medication.  Prior to terminate interview patient did endorse persistent chronic suicidal ideations.  She denies homicidal ideations and or hallucinations at this time.  Mother is at the bedside.  No questions at this time.  HPI: 56 yo F PMH schizoaffective disorder (bipolar type), Depression, prior SI, polysubstance use disorder, COPD, HTN, DM presented to the ED following suicide attempt. Patient had been drinking, while with friends stated that she was going to kill herself and was witnessed ingesting a bottle of promethazine.    Past Psychiatric History: Polysubstance abuse, Depression, Schizoaffective;Bipolar type,  Previous dx's of schizophrenia, bipolar - Multiple inpatient stays (at least 3-4) [facility], [facility] at [employer] [facility], previously had ACT team - hx of suicide attempt x1 via overdose as a teen  Risk to Self:  No Risk to Others:   No Prior Inpatient Therapy:  [facility] 2020, secondary to substance use and psychosis Prior Outpatient Therapy: Hx of [facility], ACT Team services  Past Medical History:  Past Medical History:  Diagnosis Date   Abdominal pain    Accidental drug overdose April 2013   Anxiety    Atrial fibrillation ([place]) 09/29/11   converted spontaneously   Chronic back pain    Chronic knee pain    Chronic nausea    Chronic pain    COPD (chronic obstructive pulmonary disease) ([place])    Depression    Diabetes mellitus    states her doctor took her off all DM meds in past month   Diabetic neuropathy ([place])  Dyspnea    with exertion    GERD (gastroesophageal reflux disease)    Headache(784.0)    migraines    HTN (hypertension)    not on meds since in a year    Hyperlipidemia    Hypothyroidism    not on meds in a while    Mental disorder    Bipolar and schizophrenic   Requires supplemental oxygen    as needed per patient    Schizophrenia ([employer])    Schizophrenia,  acute ([place]) 11/13/2017   Tobacco abuse     Past Surgical History:  Procedure Laterality Date   ABDOMINAL HYSTERECTOMY     BLADDER SUSPENSION  03/04/2011   Procedure: [place];  Surgeon: Elayne Snare MacDiarmid;  Location: [facility];  Service: Urology;  Laterality: N/A;   CYSTOCELE REPAIR  03/04/2011   Procedure: ANTERIOR REPAIR (CYSTOCELE);  Surgeon: Reece Packer;  Location: [facility];  Service: Urology;  Laterality: N/A;   CYSTOSCOPY  03/04/2011   Procedure: CYSTOSCOPY;  Surgeon: Elayne Snare MacDiarmid;  Location: [facility];  Service: Urology;  Laterality: N/A;   ESOPHAGOGASTRODUODENOSCOPY (EGD) WITH PROPOFOL N/A 05/12/2017   Procedure: ESOPHAGOGASTRODUODENOSCOPY (EGD) WITH PROPOFOL;  Surgeon: Alphonsa Overall, MD;  Location: [facility];  Service: General;  Laterality: N/A;   GASTRIC ROUX-EN-Y N/A 03/25/2016   Procedure: LAPAROSCOPIC ROUX-EN-Y GASTRIC BYPASS WITH UPPER ENDOSCOPY;  Surgeon: Excell Seltzer, MD;  Location: [facility];  Service: General;  Laterality: N/A;   KNEE SURGERY     LAPAROSCOPIC ASSISTED VAGINAL HYSTERECTOMY  03/04/2011   Procedure: LAPAROSCOPIC ASSISTED VAGINAL HYSTERECTOMY;  Surgeon: Cyril Mourning, MD;  Location: [facility];  Service: Gynecology;  Laterality: N/A;   Family History:  Family History  Problem Relation Age of Onset   Heart attack Father        69s   Diabetes Mother    Heart disease Mother    Hypertension Mother    Heart attack Sister        37   COPD Other    Breast cancer Neg Hx    Family Psychiatric  History: Mother, maternal cousin and maternal aunt with depression. No family hx of suicide attempt/completion.   Social History:  Social History   Substance and Sexual Activity  Alcohol Use Yes   Comment: daily     Social History   Substance and Sexual Activity  Drug Use Yes   Types: Cocaine, Marijuana, "Crack" cocaine    Social History   Socioeconomic History   Marital status: Legally Separated    Spouse name: Not on file   Number of  children: Not on file   Years of education: Not on file   Highest education level: Not on file  Occupational History   Not on file  Tobacco Use   Smoking status: Every Day    Packs/day: 0.50    Types: Cigarettes   Smokeless tobacco: Never  Vaping Use   Vaping Use: Never used  Substance and Sexual Activity   Alcohol use: Yes    Comment: daily   Drug use: Yes    Types: Cocaine, Marijuana, "Crack" cocaine   Sexual activity: Yes    Partners: Male  Other Topics Concern   Not on file  Social History Narrative   Not on file   Social Determinants of Health   Financial Resource Strain: Not on file  Food Insecurity: Not on file  Transportation Needs: Not on file  Physical Activity: Not on file  Stress: Not on file  Social  Connections: Not on file   Additional Social History:    Allergies:   Allergies  Allergen Reactions   Iron Dextran Shortness Of Breath and Anxiety   Aspirin Nausea And Vomiting and Other (See Comments)    Ok to take tylenol or ibuprofen     Labs:  Results for orders placed or performed during the hospital encounter of 01/12/22 (from the past 48 hour(s))  Glucose, capillary     Status: Abnormal   Collection Time: 02/08/22  9:04 PM  Result Value Ref Range   Glucose-Capillary 222 (H) 70 - 99 mg/dL    Comment: Glucose reference range applies only to samples taken after fasting for at least 8 hours.  Glucose, capillary     Status: Abnormal   Collection Time: 02/09/22  6:34 AM  Result Value Ref Range   Glucose-Capillary 142 (H) 70 - 99 mg/dL    Comment: Glucose reference range applies only to samples taken after fasting for at least 8 hours.  Glucose, capillary     Status: Abnormal   Collection Time: 02/09/22 11:43 AM  Result Value Ref Range   Glucose-Capillary 112 (H) 70 - 99 mg/dL    Comment: Glucose reference range applies only to samples taken after fasting for at least 8 hours.  Glucose, capillary     Status: Abnormal   Collection Time: 02/09/22   5:02 PM  Result Value Ref Range   Glucose-Capillary 240 (H) 70 - 99 mg/dL    Comment: Glucose reference range applies only to samples taken after fasting for at least 8 hours.  Glucose, capillary     Status: Abnormal   Collection Time: 02/09/22  9:28 PM  Result Value Ref Range   Glucose-Capillary 166 (H) 70 - 99 mg/dL    Comment: Glucose reference range applies only to samples taken after fasting for at least 8 hours.  Glucose, capillary     Status: Abnormal   Collection Time: 02/10/22  6:09 AM  Result Value Ref Range   Glucose-Capillary 145 (H) 70 - 99 mg/dL    Comment: Glucose reference range applies only to samples taken after fasting for at least 8 hours.  Glucose, capillary     Status: Abnormal   Collection Time: 02/10/22 11:01 AM  Result Value Ref Range   Glucose-Capillary 229 (H) 70 - 99 mg/dL    Comment: Glucose reference range applies only to samples taken after fasting for at least 8 hours.  Glucose, capillary     Status: Abnormal   Collection Time: 02/10/22  4:03 PM  Result Value Ref Range   Glucose-Capillary 162 (H) 70 - 99 mg/dL    Comment: Glucose reference range applies only to samples taken after fasting for at least 8 hours.    Current Facility-Administered Medications  Medication Dose Route Frequency Provider Last Rate Last Admin   0.9 %  sodium chloride infusion  250 mL Intravenous Continuous Candee Furbish, MD   Stopped at 01/31/22 1226   acetaminophen (TYLENOL) tablet 650 mg  650 mg Oral Q4H PRN Kaleen Mask, RPH   650 mg at 02/09/22 8502   ARIPiprazole (ABILIFY) tablet 5 mg  5 mg Oral BID Corena Pilgrim, MD   5 mg at 02/10/22 7741   ascorbic acid (VITAMIN C) tablet 500 mg  500 mg Oral Daily Kaleen Mask, RPH   500 mg at 02/10/22 2878   bisacodyl (DULCOLAX) suppository 10 mg  10 mg Rectal BID Candee Furbish, MD   10 mg at  02/06/22 2119   cyanocobalamin ((VITAMIN B-12)) injection 1,000 mcg  1,000 mcg Intramuscular Q7 days Candee Furbish, MD   1,000 mcg  at 01/28/22 3818   docusate sodium (COLACE) capsule 100 mg  100 mg Oral BID Kaleen Mask, RPH   100 mg at 02/08/22 1009   feeding supplement (ENSURE ENLIVE / ENSURE PLUS) liquid 237 mL  237 mL Oral TID WC & HS Amin, Ankit Chirag, MD   237 mL at 02/09/22 1612   ferrous sulfate tablet 325 mg  325 mg Oral BID WC Thurnell Lose, MD   325 mg at 29/93/71 6967   folic acid (FOLVITE) tablet 1 mg  1 mg Oral Daily Dang, Thuy D, RPH   1 mg at 02/10/22 8938   food thickener (SIMPLYTHICK (NECTAR/LEVEL 2/MILDLY THICK)) 1 packet  1 packet Oral PRN Donnamae Jude, RPH       gabapentin (NEURONTIN) capsule 300 mg  300 mg Oral BID Akintayo, Mojeed, MD   300 mg at 02/10/22 0836   guaiFENesin (MUCINEX) 12 hr tablet 600 mg  600 mg Oral BID Candee Furbish, MD   600 mg at 02/10/22 0837   guaiFENesin (ROBITUSSIN) 100 MG/5ML liquid 5 mL  5 mL Oral Q4H PRN Amin, Ankit Chirag, MD       heparin injection 5,000 Units  5,000 Units Subcutaneous Q8H Estill Cotta, NP   5,000 Units at 02/10/22 1446   hydrALAZINE (APRESOLINE) injection 10 mg  10 mg Intravenous Q4H PRN Amin, Ankit Chirag, MD       insulin aspart (novoLOG) injection 0-15 Units  0-15 Units Subcutaneous TID WC Thurnell Lose, MD   3 Units at 02/10/22 1608   ipratropium-albuterol (DUONEB) 0.5-2.5 (3) MG/3ML nebulizer solution 3 mL  3 mL Nebulization Q4H PRN Amin, Ankit Chirag, MD       lactulose ([place]) 10 GM/15ML solution 30 g  30 g Oral TID Jardin, Carla G, RPH   30 g at 02/08/22 1731   linaclotide (LINZESS) capsule 145 mcg  145 mcg Oral QAC breakfast Candee Furbish, MD   145 mcg at 02/10/22 0837   LORazepam (ATIVAN) injection 1 mg  1 mg Intravenous BID Jacky Kindle, MD   1 mg at 02/10/22 1118   metoCLOPramide (REGLAN) tablet 5 mg  5 mg Oral TID AC Thurnell Lose, MD   5 mg at 02/10/22 1608   metoprolol tartrate (LOPRESSOR) injection 5 mg  5 mg Intravenous Q4H PRN Amin, Ankit Chirag, MD   5 mg at 02/01/22 1821   metoprolol tartrate (LOPRESSOR)  tablet 25 mg  25 mg Oral BID Thurnell Lose, MD   25 mg at 02/10/22 0836   multivitamin with minerals tablet 1 tablet  1 tablet Oral QHS Dang, Thuy D, RPH   1 tablet at 02/08/22 2052   ondansetron (ZOFRAN) injection 4 mg  4 mg Intravenous Q6H PRN Cristal Generous, NP   4 mg at 01/29/22 1842   Oral care mouth rinse  15 mL Mouth Rinse 4 times per day Candee Furbish, MD   15 mL at 02/09/22 1151   pantoprazole (PROTONIX) EC tablet 40 mg  40 mg Oral QHS Kaleen Mask, RPH   40 mg at 02/09/22 2221   polyethylene glycol (MIRALAX / GLYCOLAX) packet 17 g  17 g Oral Daily PRN Kaleen Mask, RPH       revefenacin (YUPELRI) nebulizer solution 175 mcg  175 mcg Nebulization Daily Candee Furbish,  MD   175 mcg at 02/10/22 0732   sodium chloride flush (NS) 0.9 % injection 10-40 mL  10-40 mL Intracatheter Q12H Candee Furbish, MD   10 mL at 02/10/22 0845   traMADol (ULTRAM) tablet 50 mg  50 mg Oral Q12H PRN Thurnell Lose, MD   50 mg at 02/10/22 1608   vitamin A (Aquasol-A) injection 50,000 Units  50,000 Units Intramuscular Daily Kaleen Mask, RPH   50,000 Units at 02/08/22 1007   Followed by   Derrill Memo ON 02/11/2022] vitamin A capsule 10,000 Units  10,000 Units Oral Daily Jardin, Carla G, RPH       Vitamin D (Ergocalciferol) (DRISDOL) 1.25 MG (50000 UNIT) capsule 50,000 Units  50,000 Units Oral Q7 days Kaleen Mask, RPH   50,000 Units at 02/08/22 1009   ziprasidone (GEODON) capsule 20 mg  20 mg Oral Q12H PRN Thurnell Lose, MD        Musculoskeletal: Strength & Muscle Tone: within normal limits Gait & Station: normal Patient leans: N/A   Psychiatric Specialty Exam:  Presentation  General Appearance: Appropriate for Environment; Casual  Eye Contact:Fair  Speech:Clear and Coherent; Slow (PMV)  Speech Volume:Decreased  Handedness:Right   Mood and Affect  Mood:Anxious; Depressed  Affect:Appropriate; Congruent   Thought Process  Thought Processes:Coherent; Linear  Descriptions  of Associations:Intact  Orientation:Full (Time, Place and Person)  Thought Content:Logical  History of Schizophrenia/Schizoaffective disorder:No data recorded Duration of Psychotic Symptoms:No data recorded Hallucinations:No data recorded  Ideas of Reference:None  Suicidal Thoughts:No data recorded  Homicidal Thoughts:No data recorded   Sensorium  Memory:Immediate Fair; Recent Fair; Remote Fair  Judgment:Fair  Insight:Fair   Executive Functions  Concentration:Poor  Attention Span:Fair  [place]  Assets:Financial Resources/Insurance; Housing; Desire for Improvement; Leisure Time; Physical Health; Resilience   Sleep  Sleep:poor, 3 hours   Physical Exam: Physical Exam Vitals and nursing note reviewed.  Constitutional:      Appearance: Normal appearance. She is normal weight.  HENT:     Head: Normocephalic.     Mouth/Throat:     Comments: Lurline Idol is capped.  Neurological:     Mental Status: She is alert.    Review of Systems  Psychiatric/Behavioral:  The patient is nervous/anxious and has insomnia (agitation).   All other systems reviewed and are negative.  Blood pressure 130/78, pulse (!) 107, temperature 97.8 F (36.6 C), temperature source Oral, resp. rate 20, height 5' (1.524 m), weight 46.7 kg, last menstrual period 01/08/2011, SpO2 98 %. Body mass index is 20.12 kg/m.  Treatment Plan Summary: Plan   -Continue Abilify 5 mg p.o. twice daily for depressed mood, suicidality, agitation, impulsivity. -Continue current as needed agitation protocol.  Patient does appear to respond to verbal redirection from staff and family members, who have developed therapeutic alliance. -Will increase gabapentin 300/600 milligrams.  -Will continue current scheduled Ativan.  -Will obtain repeat EKG. -Continue one-to-one Air cabin crew. -Continue IVC papers  as she remains a risk to self and others. -Continue delirium precautions as directed. -Promote healthy sleep hygiene habits.  Goal will be to target insomnia.  Encourage nursing to reduce interruptions at nighttime, consider consolidating medications to twice a day to prevent any additional interruptions of patient's sleep.  Psychiatry consult service will continue to follow daily for ongoing evaluation of agitation, anxiety, depression, suicidal thoughts.  Disposition: No evidence of imminent risk to self or others at present.  Patient does not meet criteria for psychiatric inpatient admission. Supportive therapy provided about ongoing stressors. Refer to IOP. Discussed crisis plan, support from social network, calling 911, coming to the Emergency Department, and calling Suicide Hotline.  Suella Broad, FNP 02/10/2022 5:45 PM

## 2022-02-10 NOTE — Progress Notes (Addendum)
PROGRESS NOTE    [name]  XBW:[id number] DOB: September 07, 1965 DOA: 01/12/2022 PCP: [name], MD   Brief Narrative:  56 year old with schizoaffective disorder, depression, polysubstance abuse, COPD, HTN, DM2 for suicide attempt and overdosing on promethazine and alcohol.  Initially patient was intubated due to GCS of 3 complicated by aspiration pneumonia.  Eventually when trach placement on 7/30 and transition to trach collar 7/31.  Other complicated issues that she developed ileus, urinary retention with intermittent fluid overload.  Ongoing trach management by PCCM, GI consulted for colonic ileus.  Current events and procedures 7/23 ED after etoh consumption and intentional promethazine overdose. Intubated. Admit to ICU  7/24 Tachycardic to 130s overnight, also with episode of whole body stiffening, ?dystonia vs. seizure. EEG with irritability, LTM initiated. Neuro consulted. 7/25 Extubated, intermittently somnolent/agitated, not managing secretions well. O2 sats stable on Mitchell. Cortrak difficult to flush. 7/26 Reintubated early AM for hypoxia, airway protection. Unasyn initiated for ?aspiration PNA. LTM EEG discontinued. MRI pending. 7/27 MRI no acute changes. Wakes up follows commands on .7 dex  7/28 off NE  01/19/2022>> Trach after goals of care discussion with daughter 7/31 Transitioned to ATC.  8/2 CT abdomen: Stool in right colon.  Normal appendix.  Placed gastric bypass surgery.  Nasogastric tube in stomach.  No evidence of wall thickening or obstruction and bowel.  Questionable cystitis.  Bibasilar pleural effusions.  Ceftriaxone started. 01/28/2022.  Transferred to hospitalist service on day 16 of her hospital stay    Assessment & Plan:   Acute hypoxic respiratory failure secondary to aspiration, Status post tracheostomy - Tracheostomy placed due to recurrent intubation.  Has 6 mm cuffed Shiley.  Routine trach care.  IV Lasix repeated x 2  on 02/02/2022  due to fluid overload, also antibiotics were commenced by previous team on 01/31/2022.  Monitor.  Continue trach collar, pulmonary toiletry with trach care.  Monitor closely.  Have requested pulmonary to monitor for trach site care and to evaluate if patient is a candidate for decannulation.   Alcohol and promethazine overdose with likely attempted suicide with history of schizoaffective disorder bipolar type, history of polysubstance abuse.  Seen by psych, currently stable continue to monitor.  For now on supportive care.  On as needed Ativan and Haldol.  Psych team quested to monitor daily to manage extreme anxiety and psych issues I have requested psych has been informed on a daily basis about her ongoing severe agitation.  Per psych she is under IVC and awaits psych bed placement once medically stable i.e  decannulated.   ?? History of seizure disorder.  Currently on as needed Ativan.  EEG here nonacute.  Seen by neurology initially was on Keppra this was discontinued by neurology on 01/19/2022.   Colonic dysmotility with obstipation and some intermittent urinary retention.  On bowel regimen with bowel movements now, abdominal x-ray nonacute, some suprapubic fullness, was toward bladder scans were positive for over 300 cc of retention, received Foley catheter.  Start Flomax on 02/01/2022.  Monitor.  Urinary retention.  On Foley and Flomax, trial of Foley removal on 02/06/2022,  resolved.    Microcytic anemia.  Check anemia panel, type screen.  Received IV iron, placed on ferrous sulfate along with Folex acid, family and patient agreeable for transfusion if needed.   Moderate protein calorie malnutrition.  On oral diet , DW daughter - patient was a small eater to begin with, as patient is developing leukocytosis and recurrent fluid overload I am going to discontinue TNA  for the next few days starting on 02/02/2022 and monitor.  Her chances of developing a blood-borne infection is very high.  Discussed with  daughter.  She is agreeable with the plan as well.  Continue oral protein supplements.   History of tobacco abuse and COPD.  No acute issues counseled to quit tobacco and alcohol.   GERD.  On PPI.     DM type II.  On long-acting insulin along with sliding scale.  CBG (last 3)  Recent Labs    02/09/22 1702 02/09/22 2128 02/10/22 0609  GLUCAP 240* 166* 145*   Lab Results  Component Value Date   HGBA1C 7.4 (H) 11/14/2021     DVT prophylaxis: Subcu heparin Code Status: Full  Family Communication:     Daughter [name] [phone] on 02/02/2022, 02/03/22, 02/05/2022, 02/07/2022, 02/10/22  Status is: Inpatient Remains inpatient appropriate because: On going eval for fevers and abd pain   Nutritional status  Signs/Symptoms: severe fat depletion, severe muscle depletion  Interventions: Refer to RD note for recommendations, Ensure Enlive (each supplement provides 350kcal and 20 grams of protein), MVI  Body mass index is 20.12 kg/m.    Subjective:  Patient in bed, appears comfortable, denies any headache, no fever, no chest pain or pressure, no shortness of breath , no abdominal pain. No new focal weakness.  Pacing in the hallway and wants to be discharged home.   Examination:  Awake Alert, No new F.N deficits, tracheostomy in place with trach collar, R. Arm PICC, ++ anxious affect [place].AT,PERRAL Supple Neck, No JVD,   Symmetrical Chest wall movement, Good air movement bilaterally, CTAB RRR,No Gallops, Rubs or new Murmurs,  +ve B.Sounds, Abd Soft, No tenderness,   No Cyanosis, Clubbing or edema     Objective: Vitals:   02/10/22 0310 02/10/22 0615 02/10/22 0732 02/10/22 0811  BP: (!) 133/91   (!) 144/78  Pulse: 94   (!) 109  Resp: '16  16 20  '$ Temp: 97.9 F (36.6 C)   98.9 F (37.2 C)  TempSrc: Oral   Oral  SpO2: 94%  95% 95%  Weight:  46.7 kg    Height:        Intake/Output Summary (Last 24 hours) at 02/10/2022 0849 Last data filed at 02/10/2022 0700 Gross per  24 hour  Intake 3720 ml  Output --  Net 3720 ml   Filed Weights   02/08/22 0250 02/09/22 0153 02/10/22 0615  Weight: 47.9 kg 46.9 kg 46.7 kg     Data Reviewed:   Recent Labs  Lab 02/04/22 0500 02/05/22 0706 02/06/22 0032 02/07/22 0030 02/08/22 0015  WBC 9.7 9.6 10.5 8.2 6.8  HGB 7.5* 8.2* 7.4* 7.9* 8.0*  HCT 24.9* 27.4* 24.7* 26.9* 27.2*  PLT PLATELET CLUMPS NOTED ON SMEAR, COUNT APPEARS ADEQUATE 344 303 334 307  MCV 72.0* 73.9* 73.7* 73.9* 76.0*  MCH 21.7* 22.1* 22.1* 21.7* 22.3*  MCHC 30.1 29.9* 30.0 29.4* 29.4*  RDW 30.6* 31.2* 30.8* 31.9* 32.5*  LYMPHSABS 2.5 2.4 1.2 2.4 1.0  MONOABS 0.7 0.6 0.2 0.7 0.3  EOSABS 0.1 0.1 0.2 0.1 0.1  BASOSABS 0.0 0.1 0.1 0.0 0.1    Recent Labs  Lab 02/04/22 0500 02/05/22 0706 02/06/22 0032 02/07/22 0030 02/08/22 0015  NA 137 134* 136 137 136  K 3.8 3.8 4.2 4.7 3.8  CL 105 102 103 104 105  CO2 '27 26 28 29 27  '$ GLUCOSE 113* 130* 157* 65* 264*  BUN '11 6 10 6 '$ 5*  CREATININE <0.30* 0.32*  0.37* 0.42* 0.41*  CALCIUM 7.9* 7.8* 7.5* 7.9* 7.7*  AST 50* 36 33 29 59*  ALT 81* 70* 63* 53* 61*  ALKPHOS 142* 132* 122 111 110  BILITOT 0.2* 0.5 0.5 0.3 0.4  ALBUMIN 1.5* 1.6* 1.6* 1.8* 1.8*  MG 1.8 1.6* 2.0 2.0  --   PROCALCITON 0.20 0.12 <0.10 <0.10  --   BNP 58.2 74.0  --   --   --      Radiology Studies: No results found.   Scheduled Meds:   ARIPiprazole  5 mg Oral BID   vitamin C  500 mg Oral Daily   bisacodyl  10 mg Rectal BID   Chlorhexidine Gluconate Cloth  6 each Topical Daily   cyanocobalamin  1,000 mcg Intramuscular Q7 days   docusate sodium  100 mg Oral BID   feeding supplement  237 mL Oral TID WC & HS   ferrous sulfate  325 mg Oral BID WC   folic acid  1 mg Oral Daily   gabapentin  300 mg Oral BID   guaiFENesin  600 mg Oral BID   heparin injection (subcutaneous)  5,000 Units Subcutaneous Q8H   insulin aspart  0-15 Units Subcutaneous TID WC   lactulose  30 g Oral TID   linaclotide  145 mcg Oral QAC breakfast    LORazepam  1 mg Intravenous BID   LORazepam  1 mg Intravenous Once   metoCLOPramide  5 mg Oral TID AC   metoprolol tartrate  25 mg Oral BID   multivitamin with minerals  1 tablet Oral QHS   mouth rinse  15 mL Mouth Rinse 4 times per day   pantoprazole  40 mg Oral QHS   revefenacin  175 mcg Nebulization Daily   simethicone  40 mg Oral TID AC & HS   sodium chloride flush  10-40 mL Intracatheter Q12H   tamsulosin  0.4 mg Oral Daily   vitamin A  50,000 Units Intramuscular Daily   Followed by   Derrill Memo ON 02/11/2022] vitamin A  10,000 Units Oral Daily   Vitamin D (Ergocalciferol)  50,000 Units Oral Q7 days   Continuous Infusions:  sodium chloride Stopped (01/31/22 1226)     LOS: 29 days   Time spent= 35 mins    Lala Lund, MD [facility]  If 7PM-7AM, please contact night-coverage  02/10/2022, 8:49 AM

## 2022-02-10 NOTE — Progress Notes (Signed)
  X-cover Note: Repeated calls tonight regarding pt's increasingly agitated behavior. Still not controlled despite scheduled ativan and scheduled geodon. Given extra dose of IV ativan. Pt still wandering hallways. Pt now threatening nursing staff and throwing medical equipment at staff. RN reports hospital security has come up to patient's room multiple times tonight without an success in calming her behavior.  Orders written for bilateral ankles and wrist restraints along with waist belt.  Will need to involve psych service again for additional medications to her agitation/psychosis.   Kristopher Oppenheim, DO [facility]

## 2022-02-10 NOTE — Plan of Care (Signed)
  Problem: Fluid Volume: Goal: Ability to maintain a balanced intake and output will improve Outcome: Progressing   Problem: Nutritional: Goal: Maintenance of adequate nutrition will improve Outcome: Progressing   Problem: Clinical Measurements: Goal: Respiratory complications will improve Outcome: Progressing   Problem: Activity: Goal: Risk for activity intolerance will decrease Outcome: Progressing   Problem: Nutrition: Goal: Adequate nutrition will be maintained Outcome: Progressing   Problem: Elimination: Goal: Will not experience complications related to urinary retention Outcome: Progressing   Problem: Coping: Goal: Ability to adjust to condition or change in health will improve Outcome: Not Progressing   Problem: Coping: Goal: Level of anxiety will decrease Outcome: Not Progressing

## 2022-02-10 NOTE — Progress Notes (Signed)
Speech Language Pathology Treatment: Dysphagia  Patient Details Name: [name] MRN: [id number] DOB: 01-Jan-1966 Today's Date: 02/10/2022 Time: 1046-1100 SLP Time Calculation (min) (ACUTE ONLY): 14 min  Assessment / Plan / Recommendation Clinical Impression  [name] is undergoing a capping trial and will likely be decannulated soon.  Voice is stronger with improved quality and volume.  Today she is ambulating around the room; her mother is present. She continues to demonstrate intermittent coughing with thin liquids when drinking larger quantities or sequential sips. She requires cues to slow rate of intake and shows limited insight/recall of instructions (she was able to protect airway with small sips of thin liquid per 8/18 MBS).  Recommend continuing nectars for now - SLP will follow.   HPI HPI: 56 yo F PMH schizoaffective disorder (bipolar type), Depression, prior SI, polysubstance use disorder, COPD, HTN, DM presented to the ED on 01/12/22 following suicide attempt. Patient had been drinking, while with friends stated that she was going to kill herself and was witnessed ingesting a bottle of promethazine and alcohol overdose.   In transport with EMS became more somnolent. Arrived to ED with GCS 3 and was intubated in this setting admitted by pulmonary critical care, debated but had problems with aspiration pneumonia, underwent tracheostomy placement on 01/19/2022.  Transition to trach collar on 01/20/2022.  She currently has issues with resolving ileus, intermittent urinary retention, recently treated UTI, intermittent fluid overload.  Pt being seen by ST for PMV tx and dysphagia tx.  Currently on Dysphagia 2/nectar-thick liquids.  MBS repeated on 02/06/22 with improvement overall with swallow, but thin liquids continue to be aspiration risk in larger volumes.  Dyshagia tx to f/u for potential upgrade.      SLP Plan  Continue with current plan of care      Recommendations for follow up  therapy are one component of a multi-disciplinary discharge planning process, led by the attending physician.  Recommendations may be updated based on patient status, additional functional criteria and insurance authorization.    Recommendations  Diet recommendations: Dysphagia 2 (fine chop);Nectar-thick liquid Liquids provided via: Cup;Straw Medication Administration: Whole meds with liquid Supervision: Patient able to self feed Compensations: Slow rate;Small sips/bites;Minimize environmental distractions;Clear throat intermittently Postural Changes and/or Swallow Maneuvers: Seated upright 90 degrees                Oral Care Recommendations: Oral care BID Assistance recommended at discharge: Frequent or constant Supervision/Assistance SLP Visit Diagnosis: Dysphagia, oropharyngeal phase (R13.12) Plan: Continue with current plan of care        [name] L. Tivis Ringer, MA CCC/SLP Clinical Specialist - Acute Care SLP Acute Rehabilitation Services Office number [phone]    [name]  02/10/2022, 11:04 AM

## 2022-02-10 NOTE — Progress Notes (Signed)
Pt refused CBG monitoring.

## 2022-02-10 NOTE — Progress Notes (Signed)
Pt was decannulated onto RA without complications. Stoma site was cleaned, gauze was placed over stoma, and tape was placed to secure it. Pt is tolerating very well at this time. RT will monitor.

## 2022-02-10 NOTE — Progress Notes (Signed)
Mobility Specialist Progress Note    02/10/22 1051  Mobility  Activity Ambulated with assistance in hallway  Level of Assistance Contact guard assist, steadying assist  Assistive Device Front wheel walker  Distance Ambulated (ft) 500 ft  Activity Response Tolerated well  $Mobility charge 1 Mobility   Pt received in hall and agreeable. No complaints. Returned to sitting EOB w/ family and sitter present.   Hildred Alamin Mobility Specialist

## 2022-02-10 NOTE — Progress Notes (Signed)
Pt is now currently sleeping and has calm down after receiving verbal order for restraints from MD. Restraints were never applied to pt. MD informed and restraint order discontinued.

## 2022-02-10 NOTE — Progress Notes (Addendum)
Pt is still agitated and is throwing objects in her room at staff and screaming in the hallway.Security was called and at bedside with pt trying to calm her down. Geodon order given again with security at bedside.

## 2022-02-11 DIAGNOSIS — T50902A Poisoning by unspecified drugs, medicaments and biological substances, intentional self-harm, initial encounter: Secondary | ICD-10-CM | POA: Diagnosis not present

## 2022-02-11 LAB — CBC WITH DIFFERENTIAL/PLATELET
Abs Immature Granulocytes: 0.03 10*3/uL (ref 0.00–0.07)
Basophils Absolute: 0 10*3/uL (ref 0.0–0.1)
Basophils Relative: 0 %
Eosinophils Absolute: 0.1 10*3/uL (ref 0.0–0.5)
Eosinophils Relative: 1 %
HCT: 29.6 % — ABNORMAL LOW (ref 36.0–46.0)
Hemoglobin: 8.9 g/dL — ABNORMAL LOW (ref 12.0–15.0)
Immature Granulocytes: 0 %
Lymphocytes Relative: 30 %
Lymphs Abs: 2.6 10*3/uL (ref 0.7–4.0)
MCH: 23.3 pg — ABNORMAL LOW (ref 26.0–34.0)
MCHC: 30.1 g/dL (ref 30.0–36.0)
MCV: 77.5 fL — ABNORMAL LOW (ref 80.0–100.0)
Monocytes Absolute: 0.5 10*3/uL (ref 0.1–1.0)
Monocytes Relative: 6 %
Neutro Abs: 5.6 10*3/uL (ref 1.7–7.7)
Neutrophils Relative %: 63 %
Platelets: 311 10*3/uL (ref 150–400)
RBC: 3.82 MIL/uL — ABNORMAL LOW (ref 3.87–5.11)
RDW: 34.8 % — ABNORMAL HIGH (ref 11.5–15.5)
Smear Review: NORMAL
WBC: 8.9 10*3/uL (ref 4.0–10.5)
nRBC: 0 % (ref 0.0–0.2)

## 2022-02-11 LAB — MAGNESIUM: Magnesium: 1.8 mg/dL (ref 1.7–2.4)

## 2022-02-11 LAB — GLUCOSE, CAPILLARY
Glucose-Capillary: 195 mg/dL — ABNORMAL HIGH (ref 70–99)
Glucose-Capillary: 145 mg/dL — ABNORMAL HIGH (ref 70–99)
Glucose-Capillary: 204 mg/dL — ABNORMAL HIGH (ref 70–99)

## 2022-02-11 LAB — BRAIN NATRIURETIC PEPTIDE: B Natriuretic Peptide: 31.7 pg/mL (ref 0.0–100.0)

## 2022-02-11 LAB — BASIC METABOLIC PANEL
Anion gap: 9 (ref 5–15)
BUN: 8 mg/dL (ref 6–20)
CO2: 28 mmol/L (ref 22–32)
Calcium: 8.3 mg/dL — ABNORMAL LOW (ref 8.9–10.3)
Chloride: 99 mmol/L (ref 98–111)
Creatinine, Ser: 0.35 mg/dL — ABNORMAL LOW (ref 0.44–1.00)
GFR, Estimated: >60 mL/min (ref >60)
Glucose, Bld: 125 mg/dL — ABNORMAL HIGH (ref 70–99)
Potassium: 4.2 mmol/L (ref 3.5–5.1)
Sodium: 136 mmol/L (ref 135–145)

## 2022-02-11 LAB — SARS CORONAVIRUS 2 BY RT PCR: SARS Coronavirus 2 by RT PCR: NEGATIVE

## 2022-02-11 MED ORDER — METOPROLOL TARTRATE 50 MG PO TABS
50.0000 mg | ORAL_TABLET | Freq: Two times a day (BID) | ORAL | Status: DC
  Administered 2022-02-11 – 2022-02-12 (×3): 50 mg via ORAL
  Filled 2022-02-11 (×3): qty 1

## 2022-02-11 MED ORDER — SIMETHICONE 80 MG PO CHEW
160.0000 mg | CHEWABLE_TABLET | Freq: Four times a day (QID) | ORAL | Status: DC | PRN
  Administered 2022-02-11 (×2): 160 mg via ORAL
  Filled 2022-02-11 (×2): qty 2

## 2022-02-11 MED ORDER — AMLODIPINE BESYLATE 5 MG PO TABS
5.0000 mg | ORAL_TABLET | Freq: Every day | ORAL | Status: DC
  Administered 2022-02-11 – 2022-02-12 (×2): 5 mg via ORAL
  Filled 2022-02-11 (×2): qty 1

## 2022-02-11 MED ORDER — RISPERIDONE 0.5 MG PO TBDP
0.5000 mg | ORAL_TABLET | Freq: Two times a day (BID) | ORAL | Status: DC | PRN
  Administered 2022-02-12: 0.5 mg via ORAL
  Filled 2022-02-11 (×2): qty 1

## 2022-02-11 NOTE — Progress Notes (Signed)
Pt has an order for bladder scan and for MD to be notified if output exceeds over 300. Pt has been urinating w/o  issues. MD was notified and bladder scan order d/c.

## 2022-02-11 NOTE — Consult Note (Cosign Needed Addendum)
  Patient seen and attempted to assess x 2. On both attempts she was drowsy and unable to stay awake, she would open her eyes however she would fall back to sleep. Per nursing on 2nd attempt, she recently received Ativan due to restlessness.  Nursing also reports patient was agitated last night and security had to be called. There is no documentation suggestive of this and her behaviors.    -Psychiatry will continue to follow.  - Will continue medications and suicide sitter at this time.  -Will stop Geodon and start Risperdal M tab 0.5 mg Po BID prn for agitation, aggression and insomnia. Patient does not appear to be responding to capsule medications.  EKG obtained on 08/18; QTc 447. New order placed(08/21) if patient will comply.  -Patient  continues to meet inpatient psychiatric criteria. She is now medically stable and can be transferred to inpatient psychiatric facility.

## 2022-02-11 NOTE — Progress Notes (Signed)
PT Cancellation Note  Patient Details Name: [name] MRN: [id number] DOB: 1965/08/26   Cancelled Treatment:    Reason Eval/Treat Not Completed: Patient's level of consciousness Pt given ativan earlier due to agitation and restlessness and difficult to arouse. Will follow.   Marguarite Arbour A Treg Diemer 02/11/2022, 1:35 PM Marisa Severin, PT, DPT Acute Rehabilitation Services Secure chat preferred Office [phone]

## 2022-02-11 NOTE — Progress Notes (Signed)
Per Sheran Fava, pt has been declined at [facility].  Remains under review at [facility].  CSW faxed pt referral to: [place], MSW, [place] 8/22/20233:58 PM

## 2022-02-11 NOTE — Progress Notes (Signed)
Speech Language Pathology Treatment: Dysphagia  Patient Details Name: [name] MRN: [id number] DOB: [birth year] Today's Date: 02/11/2022 Time: 2800-3491 SLP Time Calculation (min) (ACUTE ONLY): 23 min  Assessment / Plan / Recommendation Clinical Impression  New consult received to re-evaluate swallowing now that [name] has been decannulated. She was quite sleepy after having Ativan, but was able to participate.  Accepted sips of thin liquids with coughing noted initially - coughing ceased with further trials. She masticated crackers with good attention and thorough mastication.  Required intermittent verbal cues to remain awake while eating and to take small sips/bites.  Recommend advancing diet to dysphagia 3, thin liquids. Give meds whole with puree. SLP will follow for safety/precautions.   HPI HPI: 56 yo F PMH schizoaffective disorder (bipolar type), Depression, prior SI, polysubstance use disorder, COPD, HTN, DM presented to the ED on 01/12/22 following suicide attempt. Patient had been drinking, while with friends stated that she was going to kill herself and was witnessed ingesting a bottle of promethazine and alcohol overdose.   In transport with EMS became more somnolent. Arrived to ED with GCS 3 and was intubated in this setting admitted by pulmonary critical care, debated but had problems with aspiration pneumonia, underwent tracheostomy placement on 01/19/2022.  Transition to trach collar on 01/20/2022.  She currently has issues with resolving ileus, intermittent urinary retention, recently treated UTI, intermittent fluid overload.  Pt being seen by ST for PMV tx and dysphagia tx.  Currently on Dysphagia 2/nectar-thick liquids.  MBS repeated on 02/06/22 with improvement overall with swallow, but thin liquids continue to be aspiration risk in larger volumes.  Dyshagia tx to f/u for potential upgrade.      SLP Plan  Continue with current plan of care      Recommendations for follow  up therapy are one component of a multi-disciplinary discharge planning process, led by the attending physician.  Recommendations may be updated based on patient status, additional functional criteria and insurance authorization.    Recommendations  Diet recommendations: Dysphagia 3 (mechanical soft);Thin liquid Liquids provided via: Cup;Straw Medication Administration: Whole meds with puree Supervision: Patient able to self feed Compensations: Small sips/bites                Oral Care Recommendations: Oral care BID Follow Up Recommendations: No SLP follow up Assistance recommended at discharge: Frequent or constant Supervision/Assistance SLP Visit Diagnosis: Dysphagia, oropharyngeal phase (R13.12) Plan: Continue with current plan of care         Suzy Kugel L. Tivis Ringer, MA CCC/SLP Clinical Specialist - Acute Care SLP Acute Rehabilitation Services Office number [phone]   Juan Quam Laurice  02/11/2022, 11:21 AM

## 2022-02-11 NOTE — Progress Notes (Signed)
PROGRESS NOTE    [name]  YHC:[id number] DOB: 1966/02/08 DOA: 01/12/2022 PCP: [name], MD   Brief Narrative:  56 year old with schizoaffective disorder, depression, polysubstance abuse, COPD, HTN, DM2 for suicide attempt and overdosing on promethazine and alcohol.  Initially patient was intubated due to GCS of 3 complicated by aspiration pneumonia.  Eventually when trach placement on 7/30 and transition to trach collar 7/31.  Other complicated issues that she developed ileus, urinary retention with intermittent fluid overload.  Ongoing trach management by PCCM, GI consulted for colonic ileus.  Current events and procedures 7/23 ED after etoh consumption and intentional promethazine overdose. Intubated. Admit to ICU  7/24 Tachycardic to 130s overnight, also with episode of whole body stiffening, ?dystonia vs. seizure. EEG with irritability, LTM initiated. Neuro consulted. 7/25 Extubated, intermittently somnolent/agitated, not managing secretions well. O2 sats stable on [place]. Cortrak difficult to flush. 7/26 Reintubated early AM for hypoxia, airway protection. Unasyn initiated for ?aspiration PNA. LTM EEG discontinued. MRI pending. 7/27 MRI no acute changes. Wakes up follows commands on .7 dex  7/28 off NE  01/19/2022>> Trach after goals of care discussion with daughter 7/31 Transitioned to ATC.  8/2 CT abdomen: Stool in right colon.  Normal appendix.  Placed gastric bypass surgery.  Nasogastric tube in stomach.  No evidence of wall thickening or obstruction and bowel.  Questionable cystitis.  Bibasilar pleural effusions.  Ceftriaxone started. 01/28/2022.  Transferred to hospitalist service on day 16 of her hospital stay    Assessment & Plan:   Acute hypoxic respiratory failure secondary to aspiration, Status post tracheostomy - Tracheostomy placed due to recurrent intubation.  With supportive care which included antibiotics and diuresis as needed  respiratory failure has resolved and she was eventually decannulated on 02/10/2022.  Trach site stable.  Currently on room air.   Alcohol and promethazine overdose with likely attempted suicide with history of schizoaffective disorder bipolar type, history of polysubstance abuse.  Seen by psych, currently stable continue to monitor.  For now on supportive care.  On as needed Ativan and Haldol.  Psych team quested to monitor daily to manage extreme anxiety and psych issues I have requested psych has been informed on a daily basis about her ongoing severe agitation.  Per psych she is under IVC she is medically stable await psych bed for placement.   ?? History of seizure disorder.  Currently on as needed Ativan.  EEG here nonacute.  Seen by neurology initially was on Keppra this was discontinued by neurology on 01/19/2022.   Colonic dysmotility with obstipation and some intermittent urinary retention.  On bowel regimen with bowel movements now, abdominal x-ray nonacute, some suprapubic fullness, was toward bladder scans were positive for over 300 cc of retention, received Foley catheter.  On Flomax.  Urinary retention resolved.  Urinary retention.  On Foley and Flomax, trial of Foley removal on 02/06/2022,  resolved.    Microcytic anemia.  Check anemia panel, type screen.  Received IV iron, placed on ferrous sulfate along with Folex acid, family and patient agreeable for transfusion if needed.   Moderate protein calorie malnutrition.  On oral diet , DW daughter - patient was a small eater to begin with, new oral diet, speech to advance diet on 02/11/2022 now that she has been decannulated.   History of tobacco abuse and COPD.  No acute issues counseled to quit tobacco and alcohol.   GERD.  On PPI.     DM type II.  On long-acting insulin along with  sliding scale.  CBG (last 3)  Recent Labs    02/10/22 1101 02/10/22 1603 02/11/22 0741  GLUCAP 229* 162* 195*   Lab Results  Component Value Date    HGBA1C 7.4 (H) 11/14/2021     DVT prophylaxis: Subcu heparin Code Status: Full  Family Communication:     Daughter [name] [phone] on 02/02/2022, 02/03/22, 02/05/2022, 02/07/2022, 02/10/22  Status is: Inpatient Remains inpatient appropriate because: On going eval for fevers and abd pain   Nutritional status  Signs/Symptoms: severe fat depletion, severe muscle depletion  Interventions: Refer to RD note for recommendations, Ensure Enlive (each supplement provides 350kcal and 20 grams of protein), MVI  Body mass index is 20.97 kg/m.    Subjective:  Patient in bed, appears comfortable, denies any headache, no fever, no chest pain or pressure, no shortness of breath , no abdominal pain. No new focal weakness.   Examination:  Awake Alert, No new F.N deficits, ++ anxious affect [place].AT,PERRAL Supple Neck, No JVD,   Symmetrical Chest wall movement, Good air movement bilaterally, CTAB RRR,No Gallops, Rubs or new Murmurs,  +ve B.Sounds, Abd Soft, No tenderness,   No Cyanosis, Clubbing or edema      Objective: Vitals:   02/10/22 2119 02/11/22 0409 02/11/22 0605 02/11/22 0817  BP: (!) 147/87 131/79  (!) 141/84  Pulse: (!) 108 94  (!) 101  Resp: '18 17  20  '$ Temp: 97.7 F (36.5 C) 98.3 F (36.8 C)  98 F (36.7 C)  TempSrc: Oral   Oral  SpO2: 97% 98%  97%  Weight:   48.7 kg   Height:        Intake/Output Summary (Last 24 hours) at 02/11/2022 0920 Last data filed at 02/11/2022 0441 Gross per 24 hour  Intake 960 ml  Output --  Net 960 ml   Filed Weights   02/09/22 0153 02/10/22 0615 02/11/22 0605  Weight: 46.9 kg 46.7 kg 48.7 kg     Data Reviewed:   Recent Labs  Lab 02/05/22 0706 02/06/22 0032 02/07/22 0030 02/08/22 0015 02/11/22 0204  WBC 9.6 10.5 8.2 6.8 8.9  HGB 8.2* 7.4* 7.9* 8.0* 8.9*  HCT 27.4* 24.7* 26.9* 27.2* 29.6*  PLT 344 303 334 307 311  MCV 73.9* 73.7* 73.9* 76.0* 77.5*  MCH 22.1* 22.1* 21.7* 22.3* 23.3*  MCHC 29.9* 30.0 29.4* 29.4* 30.1   RDW 31.2* 30.8* 31.9* 32.5* 34.8*  LYMPHSABS 2.4 1.2 2.4 1.0 2.6  MONOABS 0.6 0.2 0.7 0.3 0.5  EOSABS 0.1 0.2 0.1 0.1 0.1  BASOSABS 0.1 0.1 0.0 0.1 0.0    Recent Labs  Lab 02/05/22 0706 02/06/22 0032 02/07/22 0030 02/08/22 0015 02/11/22 0204  NA 134* 136 137 136 136  K 3.8 4.2 4.7 3.8 4.2  CL 102 103 104 105 99  CO2 '26 28 29 27 28  '$ GLUCOSE 130* 157* 65* 264* 125*  BUN '6 10 6 '$ 5* 8  CREATININE 0.32* 0.37* 0.42* 0.41* 0.35*  CALCIUM 7.8* 7.5* 7.9* 7.7* 8.3*  AST 36 33 29 59*  --   ALT 70* 63* 53* 61*  --   ALKPHOS 132* 122 111 110  --   BILITOT 0.5 0.5 0.3 0.4  --   ALBUMIN 1.6* 1.6* 1.8* 1.8*  --   MG 1.6* 2.0 2.0  --  1.8  PROCALCITON 0.12 <0.10 <0.10  --   --   BNP 74.0  --   --   --  31.7     Radiology Studies: No results found.  Scheduled Meds:   amLODipine  5 mg Oral Daily   ARIPiprazole  5 mg Oral BID   vitamin C  500 mg Oral Daily   bisacodyl  10 mg Rectal BID   cyanocobalamin  1,000 mcg Intramuscular Q7 days   docusate sodium  100 mg Oral BID   feeding supplement  237 mL Oral TID WC & HS   ferrous sulfate  325 mg Oral BID WC   folic acid  1 mg Oral Daily   gabapentin  300 mg Oral Daily   And   gabapentin  600 mg Oral QHS   guaiFENesin  600 mg Oral BID   heparin injection (subcutaneous)  5,000 Units Subcutaneous Q8H   insulin aspart  0-15 Units Subcutaneous TID WC   lactulose  30 g Oral TID   linaclotide  145 mcg Oral QAC breakfast   LORazepam  1 mg Intravenous BID   metoCLOPramide  5 mg Oral TID AC   metoprolol tartrate  50 mg Oral BID   multivitamin with minerals  1 tablet Oral QHS   mouth rinse  15 mL Mouth Rinse 4 times per day   pantoprazole  40 mg Oral QHS   revefenacin  175 mcg Nebulization Daily   sodium chloride flush  10-40 mL Intracatheter Q12H   vitamin A  50,000 Units Intramuscular Daily   Followed by   vitamin A  10,000 Units Oral Daily   Vitamin D (Ergocalciferol)  50,000 Units Oral Q7 days   Continuous Infusions:  sodium  chloride Stopped (01/31/22 1226)     LOS: 30 days   Time spent= 35 mins    Lala Lund, MD [facility]  If 7PM-7AM, please contact night-coverage  02/11/2022, 9:20 AM

## 2022-02-11 NOTE — Progress Notes (Signed)
Nutrition Follow-up  DOCUMENTATION CODES:   Underweight, Severe malnutrition in context of chronic illness  INTERVENTION:  Continue PO supplements:  Magic cup TID with meals, each supplement provides 290 kcal and 9 grams of protein Ensure Plus po TID, each supplement provides 350 kcal and 20 grams of protein.   Encourage intake of meals and supplements.    NUTRITION DIAGNOSIS:   Severe Malnutrition related to chronic illness (hx gastric bypass, COPD, polysubtance abuse) as evidenced by severe fat depletion, severe muscle depletion.  Ongoing  GOAL:   Patient will meet greater than or equal to 90% of their needs  Not met  MONITOR:   TF tolerance, Vent status, Labs, Weight trends  REASON FOR ASSESSMENT:   Consult, Ventilator Enteral/tube feeding initiation and management  ASSESSMENT:   56 yo female admitted with acute encephalopathy in setting of intentional OD with SI, intubated for airway protection. PMH includes COPD, HTN, DM, schizoaffective disorder, depression. Pt with hx of gastric bypass in 2017, sig weight loss   7/23 Admitted, Intubated, TF started via Protocol 7/25 Extubated, TF held, Cortrak placed, kinked and could not use, re-intubated, TF not started 7/26 Cortrak TF adjusted by RD, flushed, TF remains on hold 7/27 CT abdomen: Large amount of stool in the ascending colon.Moderate amount of stool in the descending colon. Gaseous distension of the transverse colon. Mild distension of the small bowel. 7/28 TPN consul placed 7/29 TPN initiated 7/30 Trach placed 7/31 Cortrak placed 8/02 CT abdomen/pelvis without acute intraabdominal process per report.  8/03 Trickle TF ordered by [facility] 8/04 Cortrak removed by patient. Diet advanced to Dysphagia 2, Nectar Thick post MBS 8/21 Decannulated 8/22 diet advanced to dysphagia 3, thin  Pt noted to be medically stable, pending psych bed placement.    Pt given ativan just prior to visit. She had been reassessed by ST  just prior to visit. ST recommend diet upgrade to dysphagia 3/thin liquids.  Spoke with LPN regarding PO intake. She states that she has been drinking supplements. Uncertain how many she has been drinking. Will continue with order as is.   Meal completions: 8/22: 75%-breakfast, 50%-lunch  Last documented BM is 8/13. Reached out to RN who spoke with sitter. They report that pt has a BM today.   Admit weight: 42.8 kg Current weight: 48.7 kg  Medications: Vitamin C, dulcolax, Vitamin B12, colace, ferrous sulfate, folvite, SSI 0-15 units TID, lactulose, linzess, reglan, MVI, protonix, Vitamin D q7 days  Labs reviewed  Diet Order:   Diet Order             DIET DYS 3 Room service appropriate? No; Fluid consistency: Thin  Diet effective now                   EDUCATION NEEDS:   Not appropriate for education at this time  Skin:  Skin Assessment: Reviewed RN Assessment  Last BM:  8/22 (per sitter)  Height:   Ht Readings from Last 1 Encounters:  01/12/22 5' (1.524 m)    Weight:   Wt Readings from Last 1 Encounters:  02/11/22 48.7 kg   BMI:  Body mass index is 20.97 kg/m.  Estimated Nutritional Needs:   Kcal:  1600-1800 kcals  Protein:  100-115 g  Fluid:  >/= 1.6 L  Clayborne Dana, RDN, LDN Clinical Nutrition

## 2022-02-11 NOTE — Care Management Important Message (Signed)
Important Message  Patient Details  Name: [name] MRN: [id number] Date of Birth: 02-09-1966   Medicare Important Message Given:  Yes     [name] 02/11/2022, 11:39 AM

## 2022-02-11 NOTE — Progress Notes (Signed)
OT Cancellation Note  Patient Details Name: RAYNESHA TIEDT MRN: [id number] DOB: 1966-06-07   Cancelled Treatment:    Reason Eval/Treat Not Completed: Patient's level of consciousness (s/p ativan)  Jeri Modena 02/11/2022, 1:54 PM

## 2022-02-12 DIAGNOSIS — K219 Gastro-esophageal reflux disease without esophagitis: Secondary | ICD-10-CM

## 2022-02-12 DIAGNOSIS — R9401 Abnormal electroencephalogram [EEG]: Secondary | ICD-10-CM

## 2022-02-12 DIAGNOSIS — Z720 Tobacco use: Secondary | ICD-10-CM

## 2022-02-12 DIAGNOSIS — E559 Vitamin D deficiency, unspecified: Secondary | ICD-10-CM

## 2022-02-12 DIAGNOSIS — E509 Vitamin A deficiency, unspecified: Secondary | ICD-10-CM

## 2022-02-12 DIAGNOSIS — R338 Other retention of urine: Secondary | ICD-10-CM

## 2022-02-12 DIAGNOSIS — K5901 Slow transit constipation: Secondary | ICD-10-CM

## 2022-02-12 DIAGNOSIS — K5900 Constipation, unspecified: Secondary | ICD-10-CM

## 2022-02-12 DIAGNOSIS — D509 Iron deficiency anemia, unspecified: Secondary | ICD-10-CM

## 2022-02-12 DIAGNOSIS — E54 Ascorbic acid deficiency: Secondary | ICD-10-CM

## 2022-02-12 LAB — CBC WITH DIFFERENTIAL/PLATELET
Abs Immature Granulocytes: 0 10*3/uL (ref 0.00–0.07)
Basophils Absolute: 0 10*3/uL (ref 0.0–0.1)
Basophils Relative: 0 %
Eosinophils Absolute: 0 10*3/uL (ref 0.0–0.5)
Eosinophils Relative: 0 %
HCT: 35.1 % — ABNORMAL LOW (ref 36.0–46.0)
Hemoglobin: 10.2 g/dL — ABNORMAL LOW (ref 12.0–15.0)
Lymphocytes Relative: 22 %
Lymphs Abs: 1.6 10*3/uL (ref 0.7–4.0)
MCH: 23.3 pg — ABNORMAL LOW (ref 26.0–34.0)
MCHC: 29.1 g/dL — ABNORMAL LOW (ref 30.0–36.0)
MCV: 80.1 fL (ref 80.0–100.0)
Monocytes Absolute: 0.4 10*3/uL (ref 0.1–1.0)
Monocytes Relative: 6 %
Neutro Abs: 5.3 10*3/uL (ref 1.7–7.7)
Neutrophils Relative %: 72 %
Platelets: 375 10*3/uL (ref 150–400)
RBC: 4.38 MIL/uL (ref 3.87–5.11)
RDW: 35 % — ABNORMAL HIGH (ref 11.5–15.5)
WBC: 7.3 10*3/uL (ref 4.0–10.5)
nRBC: 0 % (ref 0.0–0.2)
nRBC: 0 /100 WBC

## 2022-02-12 LAB — BASIC METABOLIC PANEL
Anion gap: 8 (ref 5–15)
BUN: 5 mg/dL — ABNORMAL LOW (ref 6–20)
CO2: 30 mmol/L (ref 22–32)
Calcium: 8.8 mg/dL — ABNORMAL LOW (ref 8.9–10.3)
Chloride: 96 mmol/L — ABNORMAL LOW (ref 98–111)
Creatinine, Ser: 0.45 mg/dL (ref 0.44–1.00)
GFR, Estimated: >60 mL/min (ref >60)
Glucose, Bld: 169 mg/dL — ABNORMAL HIGH (ref 70–99)
Potassium: 4.6 mmol/L (ref 3.5–5.1)
Sodium: 134 mmol/L — ABNORMAL LOW (ref 135–145)

## 2022-02-12 LAB — GLUCOSE, CAPILLARY
Glucose-Capillary: 221 mg/dL — ABNORMAL HIGH (ref 70–99)
Glucose-Capillary: 182 mg/dL — ABNORMAL HIGH (ref 70–99)

## 2022-02-12 LAB — MAGNESIUM: Magnesium: 1.8 mg/dL (ref 1.7–2.4)

## 2022-02-12 LAB — BRAIN NATRIURETIC PEPTIDE: B Natriuretic Peptide: 70.9 pg/mL (ref 0.0–100.0)

## 2022-02-12 MED ORDER — GABAPENTIN 300 MG PO CAPS: ORAL_CAPSULE | ORAL | 0 refills | Status: DC

## 2022-02-12 MED ORDER — ASCORBIC ACID 500 MG PO TABS: 500.0000 mg | ORAL_TABLET | Freq: Every day | ORAL | Status: DC

## 2022-02-12 MED ORDER — VITAMIN D (ERGOCALCIFEROL) 1.25 MG (50000 UNIT) PO CAPS: 50000.0000 [IU] | ORAL_CAPSULE | ORAL | 0 refills | Status: AC

## 2022-02-12 MED ORDER — LORAZEPAM 2 MG/ML IJ SOLN: 1.0000 mg | Freq: Two times a day (BID) | INTRAMUSCULAR | 0 refills | Status: DC

## 2022-02-12 MED ORDER — VITAMIN B-12 1000 MCG PO TABS: 1000.0000 ug | ORAL_TABLET | Freq: Every day | ORAL | 0 refills | Status: AC

## 2022-02-12 MED ORDER — INSULIN ASPART 100 UNIT/ML IJ SOLN: 0.0000 [IU] | Freq: Three times a day (TID) | INTRAMUSCULAR | 11 refills | Status: DC

## 2022-02-12 MED ORDER — VITAMIN A 3 MG (10000 UNIT) PO CAPS: 10000.0000 [IU] | ORAL_CAPSULE | Freq: Every day | ORAL | 0 refills | Status: DC

## 2022-02-12 MED ORDER — ARIPIPRAZOLE 5 MG PO TABS: 5.0000 mg | ORAL_TABLET | Freq: Two times a day (BID) | ORAL | 0 refills | Status: DC

## 2022-02-12 MED ORDER — AMLODIPINE BESYLATE 5 MG PO TABS
5.0000 mg | ORAL_TABLET | Freq: Every day | ORAL | 0 refills | Status: DC
Start: 2022-02-12 — End: 2022-04-29

## 2022-02-12 MED ORDER — IPRATROPIUM-ALBUTEROL 0.5-2.5 (3) MG/3ML IN SOLN: 3.0000 mL | Freq: Four times a day (QID) | RESPIRATORY_TRACT | Status: DC | PRN

## 2022-02-12 MED ORDER — LINACLOTIDE 145 MCG PO CAPS: 145.0000 ug | ORAL_CAPSULE | Freq: Every day | ORAL | Status: DC

## 2022-02-12 MED ORDER — GABAPENTIN 300 MG PO CAPS: 600.0000 mg | ORAL_CAPSULE | Freq: Every day | ORAL | Status: DC

## 2022-02-12 MED ORDER — RISPERIDONE 0.5 MG PO TBDP: 0.5000 mg | ORAL_TABLET | Freq: Two times a day (BID) | ORAL | Status: DC | PRN

## 2022-02-12 MED ORDER — LACTULOSE 10 GM/15ML PO SOLN: 30.0000 g | Freq: Two times a day (BID) | ORAL | 0 refills | Status: DC

## 2022-02-12 MED ORDER — FOLIC ACID 1 MG PO TABS: 1.0000 mg | ORAL_TABLET | Freq: Every day | ORAL | Status: DC

## 2022-02-12 MED ORDER — METOPROLOL TARTRATE 50 MG PO TABS: 50.0000 mg | ORAL_TABLET | Freq: Two times a day (BID) | ORAL | 0 refills | Status: DC

## 2022-02-12 NOTE — TOC Transition Note (Signed)
Transition of Care [facility]) - CM/SW Discharge Note   Patient Details  Name: [name] MRN: [id number] Date of Birth: 1965-08-27  [facility] CM/SW Contact:  Joanne Chars, LCSW Phone Number: 02/12/2022, 11:03 AM   Clinical Narrative:   Pt accepted for admission to [facility] in [place], needs to be brought to main campus. Accepting MD: Selinda Flavin.  RN call [phone] for report.  [facility] called for transport and will call RN when they are on their way.     Final next level of care: Psychiatric Hospital Barriers to Discharge: Barriers Resolved   Patient Goals and CMS Choice   CMS Medicare.gov Compare Post Acute Care list provided to:: Patient Represenative (must comment) (Patients daughter [name]) Choice offered to / list presented to : Adult Children (Patients daughter [name])  Discharge Placement              Patient chooses bed at:  (IVC to [facility]) Patient to be transferred to facility by: [facility] Name of family member notified: [name] Patient and family notified of of transfer: 02/12/22  Discharge Plan and Services In-house Referral: Clinical Social Work Discharge Planning Services: CM Consult                                 Social Determinants of Health (SDOH) Interventions     Readmission Risk Interventions    07/27/2020   11:06 AM  Readmission Risk Prevention Plan  Transportation Screening Complete  PCP or Specialist Appt within 5-7 Days Complete  Home Care Screening Complete

## 2022-02-12 NOTE — Assessment & Plan Note (Signed)
Iron deficiency Continue iron supplementation, normal b12 and folate Needs outpatient follow up and w/u of anemia further as able

## 2022-02-12 NOTE — Assessment & Plan Note (Signed)
Due to aspiration, s/p intubation, mechanical ventilation, trach decannulated 8/21 Treated with abx and diuretics Currently on RA Needs pulmonary follow up in 1 month

## 2022-02-12 NOTE — Assessment & Plan Note (Signed)
Abnormal EEG noted, was temporarily on keppra BID keppra d/c'd on 7/30 per neurology recs MRI brain 7/27 without acute abnormalities

## 2022-02-12 NOTE — Assessment & Plan Note (Signed)
resolved 

## 2022-02-12 NOTE — Assessment & Plan Note (Signed)
noted 

## 2022-02-12 NOTE — Assessment & Plan Note (Signed)
Supplement, follow

## 2022-02-12 NOTE — Hospital Course (Signed)
56 year old with schizoaffective disorder, depression, polysubstance abuse, COPD, HTN, DM2 for suicide attempt and overdosing on promethazine and alcohol.  Initially patient was intubated due to GCS of 3 complicated by aspiration pneumonia.  Eventually when trach placement on 7/30 and transition to trach collar 7/31.  Other complicated issues that she developed ileus, urinary retention with intermittent fluid overload.  Ongoing trach management by PCCM, GI consulted for colonic ileus.   Current events and procedures 7/23 ED after etoh consumption and intentional promethazine overdose. Intubated. Admit to ICU  7/24 Tachycardic to 130s overnight, also with episode of whole body stiffening, ?dystonia vs. seizure. EEG with irritability, LTM initiated. Neuro consulted. 7/25 Extubated, intermittently somnolent/agitated, not managing secretions well. O2 sats stable on [place]. Cortrak difficult to flush. 7/26 Reintubated early AM for hypoxia, airway protection. Unasyn initiated for ?aspiration PNA. LTM EEG discontinued. MRI pending. 7/27 MRI no acute changes. Wakes up follows commands on .7 dex  7/28 off NE  01/19/2022>> Trach after goals of care discussion with daughter 7/31 Transitioned to ATC.  8/2 CT abdomen: Stool in right colon.  Normal appendix.  Placed gastric bypass surgery.  Nasogastric tube in stomach.  No evidence of wall thickening or obstruction and bowel.  Questionable cystitis.  Bibasilar pleural effusions.  Ceftriaxone started. 01/28/2022.  Transferred to hospitalist service on day 16 of her hospital stay 02/10/2022, decannulated Discharge to [facility] 8/23

## 2022-02-12 NOTE — Assessment & Plan Note (Signed)
S/p GI consult Bowel regimen Follow outpatient

## 2022-02-12 NOTE — Discharge Summary (Addendum)
Physician Discharge Summary  NIOMA MCCUBBINS WRU:[id number] DOB: [birth year] DOA: 01/12/2022  PCP: [name], MD  Admit date: 01/12/2022 Discharge date: 02/12/2022  Time spent: 40 minutes  Recommendations for Outpatient Follow-up:  Follow outpatient CBC/CMP  Follow outpatient psych recs for meds Follow vitamin deficiencies (Franceska Strahm, C, D) and iron deficiency Follow constipation outpatient, adjust meds as necessary Follow BP outpatient, adjust meds prn Follow diabetes and blood sugars outpatient, adjust meds prn  Discharge Diagnoses:  Principal Problem:   Intentional overdose ([place]) Active Problems:   Acute respiratory failure with hypoxia ([place])   Abnormal EEG   Constipation   Acute urinary retention   Anemia   Protein-calorie malnutrition, severe   Type 2 diabetes mellitus (HCC)   Ileus (HCC)   Vitamin Sheneka Schrom deficiency   Vitamin D deficiency   Vitamin C deficiency   Iron deficiency anemia   Essential hypertension   COPD (chronic obstructive pulmonary disease) (HCC)   Hypokalemia   GERD (gastroesophageal reflux disease)   Tobacco abuse   Discharge Condition: stable  Diet recommendation: dysphagia 3  Filed Weights   02/09/22 0153 02/10/22 0615 02/11/22 0605  Weight: 46.9 kg 46.7 kg 48.7 kg    History of present illness:  56 year old with schizoaffective disorder, depression, polysubstance abuse, COPD, HTN, DM2 for suicide attempt and overdosing on promethazine and alcohol.  Initially patient was intubated due to GCS of 3 complicated by aspiration pneumonia.  Eventually when trach placement on 7/30 and transition to trach collar 7/31.  Other complicated issues that she developed ileus, urinary retention with intermittent fluid overload.  Ongoing trach management by PCCM, GI consulted for colonic ileus.   Current events and procedures 7/23 ED after etoh consumption and intentional promethazine overdose. Intubated. Admit to ICU  7/24 Tachycardic to 130s overnight, also with  episode of whole body stiffening, ?dystonia vs. seizure. EEG with irritability, LTM initiated. Neuro consulted. 7/25 Extubated, intermittently somnolent/agitated, not managing secretions well. O2 sats stable on [place]. Cortrak difficult to flush. 7/26 Reintubated early AM for hypoxia, airway protection. Unasyn initiated for ?aspiration PNA. LTM EEG discontinued. MRI pending. 7/27 MRI no acute changes. Wakes up follows commands on .7 dex  7/28 off NE  01/19/2022>> Trach after goals of care discussion with daughter 7/31 Transitioned to ATC.  8/2 CT abdomen: Stool in right colon.  Normal appendix.  Placed gastric bypass surgery.  Nasogastric tube in stomach.  No evidence of wall thickening or obstruction and bowel.  Questionable cystitis.  Bibasilar pleural effusions.  Ceftriaxone started. 01/28/2022.  Transferred to hospitalist service on day 16 of her hospital stay 02/10/2022, decannulated Discharge to [facility] 8/23  Hospital Course:  Assessment and Plan: * Intentional overdose ([place]) Alcohol and promethazine overdose, thought intentional overdose, suicide attempt Known hx schizoaffective disorder bipolar type and hx substance abuse Currently on scheduled ativan, risperdal BID prn, gabapentin Continued psychiatric medications to be adjusted per psychiatry  Delirium precautions Safety sitter  Acute respiratory failure with hypoxia ([place]) Due to aspiration, s/p intubation, mechanical ventilation, trach decannulated 8/21 Treated with abx and diuretics Currently on RA Needs pulmonary follow up in 1 month  Constipation Bowel regimen, on linzess, lactulose Would continue to adjust outpatient as needed  Abnormal EEG Abnormal EEG noted, was temporarily on keppra BID keppra d/c'd on 7/30 per neurology recs MRI brain 7/27 without acute abnormalities  Acute urinary retention resolved  Anemia Iron deficiency Continue iron supplementation, normal b12 and folate Needs outpatient follow up and  w/u of anemia further as able  Protein-calorie malnutrition, severe noted  Type 2 diabetes mellitus (HCC) SSI, metformin at discharge Adjust as needed  Vitamin C deficiency Supplement, follow   Vitamin D deficiency Supplement, follow   Vitamin Kingstin Heims deficiency Supplement, follow   Ileus ([place]) S/p GI consult Bowel regimen Follow outpatient   Essential hypertension Continue amlodipine, metoprolol  GERD (gastroesophageal reflux disease) PPI  COPD (chronic obstructive pulmonary disease) ([place]) No signs of exacerbation Continue meds      Procedures: 8/21 - trach decannulation 7/30 EEG This technically difficult study is suggestive of moderate severe diffuse encephalopathy, nonspecific etiology. No seizures were seen throughout the recording. 7/26 intubation 7/25 extubation 7/25 EEG This study showed evidence of epileptogenicity with generalized onset but at times appeared to have left and right frontal predominance. Additionally there is moderate to severe diffuse encephalopathy, nonspecific etiology. No seizures were seen throughout the recording. 7/24 EEG This study showed evidence of independent epileptogenicity arising from left> right frontal region. Additionally there is moderate to severe diffuse encephalopathy, nonspecific etiology. No seizures were seen throughout the recording. 7/23 intubation  Consultations: Psychiatry Neurology PCCM GI  Discharge Exam: Vitals:   02/11/22 2004 02/12/22 0700  BP: 108/70 116/76  Pulse: 100 92  Resp: 18 18  Temp: 98.1 F (36.7 C) 97.9 F (36.6 C)  SpO2: 95% 100%   No complaints No pain Discussed d/c plan with daughter  General: No acute distress. Cardiovascular: RRR Lungs: unlabored Neurological: Alert. Moves all extremities 4 Extremities: No clubbing or cyanosis. No edema  Discharge Instructions   Discharge Instructions     Call MD for:  difficulty breathing, headache or visual disturbances   Complete by:  As directed    Call MD for:  extreme fatigue   Complete by: As directed    Call MD for:  hives   Complete by: As directed    Call MD for:  persistant dizziness or light-headedness   Complete by: As directed    Call MD for:  persistant nausea and vomiting   Complete by: As directed    Call MD for:  redness, tenderness, or signs of infection (pain, swelling, redness, odor or green/yellow discharge around incision site)   Complete by: As directed    Call MD for:  severe uncontrolled pain   Complete by: As directed    Call MD for:  temperature >100.4   Complete by: As directed    Diet - low sodium heart healthy   Complete by: As directed    Discharge instructions   Complete by: As directed    You were seen after Declan Adamson suicide attempt.  You've been treated medically with supportive care and have gradually improved.  You're being discharged to inpatient psychiatry for continued psychiatric care.  Your psych meds have been adjusted per psych and will continue to be adjusted at inpatient psych.  You had Tove Wideman tracheostomy and have now been decannulated.  You'll need to follow up with pulmonology in 1 month to follow wound care for this.  You had several nutrient deficiencies and are being discharged on vitamin Tupac Jeffus supplementation, vitamin D supplementation, iron supplementation, vitamin C supplementation.  These deficiencies should be followed outpatient.  We've adjusted your blood pressure medicines.  You're now on amlodipine and metoprolol.  Follow this outpatient.   You have diabetes.  Your blood sugars will need to be monitored.  Continue metformin.  We're continuing sliding scale insulin at discharge.    Return for new, recurrent or worsening symptoms.  Please ask your PCP  to request records from this hospitalization so they know what was done and what the next steps will be.   Discharge wound care:   Complete by: As directed    Silicone foam dressings to the  sacrum change every 3 days.  Assess under dressings each shift for any acute changes in the wounds.   Increase activity slowly   Complete by: As directed       Allergies as of 02/12/2022       Reactions   Iron Dextran Shortness Of Breath, Anxiety   Aspirin Nausea And Vomiting, Other (See Comments)   Ok to take tylenol or ibuprofen        Medication List     STOP taking these medications    Abilify Maintena 400 MG Prsy prefilled syringe Generic drug: ARIPiprazole ER Replaced by: ARIPiprazole 5 MG tablet   ALPRAZolam 1 MG tablet Commonly known as: XANAX   Caplyta 42 MG capsule Generic drug: lumateperone tosylate   diltiazem 60 MG tablet Commonly known as: CARDIZEM   insulin aspart 100 UNIT/ML FlexPen Commonly known as: NOVOLOG Replaced by: insulin aspart 100 UNIT/ML injection       TAKE these medications    albuterol 108 (90 Base) MCG/ACT inhaler Commonly known as: ProAir HFA Inhale 2 puffs into the lungs every 6 (six) hours as needed for wheezing or shortness of breath.   amLODipine 5 MG tablet Commonly known as: NORVASC Take 1 tablet (5 mg total) by mouth daily.   ARIPiprazole 5 MG tablet Commonly known as: ABILIFY Take 1 tablet (5 mg total) by mouth 2 (two) times daily. Replaces: Abilify Maintena 400 MG Prsy prefilled syringe   ascorbic acid 500 MG tablet Commonly known as: VITAMIN C Take 1 tablet (500 mg total) by mouth daily.   Cholecalciferol 25 MCG (1000 UT) capsule Take 1 capsule (1,000 Units total) by mouth daily.   cyanocobalamin 1000 MCG tablet Commonly known as: VITAMIN B12 Take 1 tablet (1,000 mcg total) by mouth daily.   ferrous sulfate 325 (65 FE) MG tablet Take 1 tablet (325 mg total) by mouth daily with breakfast.   folic acid 1 MG tablet Commonly known as: FOLVITE Take 1 tablet (1 mg total) by mouth daily.   gabapentin 300 MG capsule Commonly known as: NEURONTIN Take 1 capsule (300 mg total) by mouth daily AND 2 capsules (600 mg total) at bedtime.    guaiFENesin 600 MG 12 hr tablet Commonly known as: MUCINEX Take 1 tablet (600 mg total) by mouth 2 (two) times daily.   ibuprofen 800 MG tablet Commonly known as: ADVIL Take 800 mg by mouth 3 (three) times daily as needed for pain.   insulin aspart 100 UNIT/ML injection Commonly known as: novoLOG Inject 0-15 Units into the skin 3 (three) times daily with meals. CBG < 70: treat hypoglycemia   CBG 70 - 120: 0 units  CBG 121 - 150: 2 units  CBG 151 - 200: 3 units  CBG 201 - 250: 5 units  CBG 251 - 300: 8 units  CBG 301 - 350: 11 units  CBG 351 - 400: 15 units  CBG > 400: call MD Replaces: insulin aspart 100 UNIT/ML FlexPen   ipratropium-albuterol 0.5-2.5 (3) MG/3ML Soln Commonly known as: DUONEB Take 3 mLs by nebulization every 6 (six) hours as needed.   lactulose 10 GM/15ML solution Commonly known as: CHRONULAC Take 45 mLs (30 g total) by mouth 2 (two) times daily.   linaclotide 145 MCG Caps capsule Commonly  known as: LINZESS Take 1 capsule (145 mcg total) by mouth daily before breakfast. Start taking on: February 13, 2022   LORazepam 2 MG/ML injection Commonly known as: ATIVAN Inject 0.5 mLs (1 mg total) into the vein 2 (two) times daily.   metFORMIN 500 MG tablet Commonly known as: Glucophage Take 1 tablet (500 mg total) by mouth 2 (two) times daily with Zennie Ayars meal.   metoprolol tartrate 50 MG tablet Commonly known as: LOPRESSOR Take 1 tablet (50 mg total) by mouth 2 (two) times daily.   multivitamin with minerals Tabs tablet Take 1 tablet by mouth 2 (two) times daily.   nicotine 21 mg/24hr patch Commonly known as: NICODERM CQ - dosed in mg/24 hours Place 1 patch (21 mg total) onto the skin daily. (May purchase from over the counter): For nicotine withdrawal symptoms   pantoprazole 40 MG tablet Commonly known as: Protonix Take 1 tablet (40 mg total) by mouth daily.   risperiDONE 0.5 MG disintegrating tablet Commonly known as: RISPERDAL M-TABS Take 1 tablet (0.5  mg total) by mouth 2 (two) times daily as needed (agitation, insomnia,Latandra Loureiro ggression).   SUMAtriptan 6 MG/0.5ML Soaj Inject 6 mg into the muscle daily as needed for migraine.   Symbicort 160-4.5 MCG/ACT inhaler Generic drug: budesonide-formoterol Inhale 2 puffs into the lungs daily as needed (sob and wheezing).   vitamin Jailey Booton 3 MG (10000 UNITS) capsule Take 1 capsule (10,000 Units total) by mouth daily.   Vitamin D (Ergocalciferol) 1.25 MG (50000 UNIT) Caps capsule Commonly known as: DRISDOL Take 1 capsule (50,000 Units total) by mouth every 7 (seven) days for 6 doses. Start taking on: February 15, 2022               Discharge Care Instructions  (From admission, onward)           Start     Ordered   02/12/22 0000  Discharge wound care:       Comments: Silicone foam dressings to the  sacrum change every 3 days. Assess under dressings each shift for any acute changes in the wounds.   02/12/22 1005           Allergies  Allergen Reactions   Iron Dextran Shortness Of Breath and Anxiety   Aspirin Nausea And Vomiting and Other (See Comments)    Ok to take tylenol or ibuprofen     Follow-up Information     Erick Colace, NP Follow up.   Specialties: Nurse Practitioner, Acute Care, Pulmonary Disease Why: Needs follow up in 1 month for follow up after tracheostomy/decannulation Contact information: [name] [id number] [phone]                  The results of significant diagnostics from this hospitalization (including imaging, microbiology, ancillary and laboratory) are listed below for reference.    Significant Diagnostic Studies: DG Swallowing Func-Speech Pathology  Result Date: 02/06/2022 Table formatting from the original result was not included. Objective Swallowing Evaluation: Type of Study: MBS-Modified Barium Swallow Study  Patient Details Name: ESTEEN DELPRIORE MRN: [id number] Date of Birth: 26-Sep-1965  Today's Date: 02/06/2022 Time: SLP Start Time (ACUTE ONLY): 1030 -SLP Stop Time (ACUTE ONLY): 1045 SLP Time Calculation (min) (ACUTE ONLY): 15 min Past Medical History: Past Medical History: Diagnosis Date  Abdominal pain   Accidental drug overdose April 2013  Anxiety   Atrial fibrillation ([place]) 09/29/11  converted spontaneously  Chronic back pain   Chronic knee pain  Chronic nausea   Chronic pain   COPD (chronic obstructive pulmonary disease) (HCC)   Depression   Diabetes mellitus   states her doctor took her off all DM meds in past month  Diabetic neuropathy ([place])   Dyspnea   with exertion   GERD (gastroesophageal reflux disease)   Headache(784.0)   migraines   HTN (hypertension)   not on meds since in [place] year   Hyperlipidemia   Hypothyroidism   not on meds in [place] while   Mental disorder   Bipolar and schizophrenic  Requires supplemental oxygen   as needed per patient   Schizophrenia ([place])   Schizophrenia, acute ([place]) 11/13/2017  Tobacco abuse  Past Surgical History: Past Surgical History: Procedure Laterality Date  ABDOMINAL HYSTERECTOMY    BLADDER SUSPENSION  03/04/2011  Procedure: [facility] PROCEDURE;  Surgeon: Elayne Snare MacDiarmid;  Location: [facility];  Service: Urology;  Laterality: N/Patt Steinhardt;  CYSTOCELE REPAIR  03/04/2011  Procedure: ANTERIOR REPAIR (CYSTOCELE);  Surgeon: Reece Packer;  Location: [facility];  Service: Urology;  Laterality: N/Najae Rathert;  CYSTOSCOPY  03/04/2011  Procedure: CYSTOSCOPY;  Surgeon: Elayne Snare MacDiarmid;  Location: [facility];  Service: Urology;  Laterality: N/Aspyn Warnke;  ESOPHAGOGASTRODUODENOSCOPY (EGD) WITH PROPOFOL N/Mariette Cowley 05/12/2017  Procedure: ESOPHAGOGASTRODUODENOSCOPY (EGD) WITH PROPOFOL;  Surgeon: Alphonsa Overall, MD;  Location: [facility];  Service: General;  Laterality: N/Nataki Mccrumb;  GASTRIC ROUX-EN-Y N/Latanza Pfefferkorn 03/25/2016  Procedure: LAPAROSCOPIC ROUX-EN-Y GASTRIC BYPASS WITH UPPER ENDOSCOPY;  Surgeon: Excell Seltzer, MD;  Location: [facility];  Service: General;  Laterality: N/Tomoya Ringwald;  KNEE SURGERY    LAPAROSCOPIC ASSISTED VAGINAL  HYSTERECTOMY  03/04/2011  Procedure: LAPAROSCOPIC ASSISTED VAGINAL HYSTERECTOMY;  Surgeon: Cyril Mourning, MD;  Location: [facility];  Service: Gynecology;  Laterality: N/Afnan Cadiente; HPI: 56 yo F PMH schizoaffective disorder (bipolar type), Depression, prior SI, polysubstance use disorder, COPD, HTN, DM presented to the ED on 01/12/22 following suicide attempt. Patient had been drinking, while with friends stated that she was going to kill herself and was witnessed ingesting Markeisha Mancias bottle of promethazine and alcohol overdose.   In transport with EMS became more somnolent. Arrived to ED with GCS 3 and was intubated in this setting admitted by pulmonary critical care, debated but had problems with aspiration pneumonia, underwent tracheostomy placement on 01/19/2022.  Transition to trach collar on 01/20/2022.  She currently has issues with resolving ileus, intermittent urinary retention, recently treated UTI, intermittent fluid overload.  Pt being seen by ST for PMV tx and dysphagia tx.  Currently on Dysphagia 2/nectar-thick liquids.  MBS ordered to progress diet as able.  Subjective: "I want that cracker"  Recommendations for follow up therapy are one component of Rocio Roam multi-disciplinary discharge planning process, led by the attending physician.  Recommendations may be updated based on patient status, additional functional criteria and insurance authorization. Assessment / Plan / Recommendation   02/06/2022   6:25 PM Clinical Impressions Clinical Impression Study completed with PMV donned and pt exhibiting oropharyngeal dysphagia characterized by impaired mastication and improved bolus cohesion with solids.  Mikal Blasdell pharyngeal delay noted overall with premature spillage to the level of the valleculae.  Decreased anterior laryngeal movement noted resulting in deep penetration with eventual aspiration secondary to decreased airway closure with larger amounts of thin liquids.  Recommend possible upgrade to mechanical soft (Dysphagia 3) with  nectar-thickened liquids with meals with continued ST to strengthen/coordinate musculature/structures to improve overall swallow efficiency/accuracy. SLP Visit Diagnosis Dysphagia, oropharyngeal phase (R13.12) Impact on safety and function Mild aspiration risk;Moderate aspiration risk     02/06/2022  6:25 PM Treatment Recommendations Treatment Recommendations Therapy as outlined in treatment plan below     02/06/2022   6:25 PM Prognosis Prognosis for Safe Diet Advancement Good Barriers to Reach Goals Behavior   02/06/2022   6:25 PM Diet Recommendations SLP Diet Recommendations Dysphagia 3 (Mech soft) solids;Nectar thick liquid;Other (Comment) Liquid Administration via Cup;No straw Medication Administration Other (Comment) Compensations Slow rate;Small sips/bites;Multiple dry swallows after each bite/sip Postural Changes Seated upright at 90 degrees     02/06/2022   6:25 PM Other Recommendations Oral Care Recommendations Oral care BID;Staff/trained caregiver to provide oral care Follow Up Recommendations Follow physician's recommendations for discharge plan and follow up therapies Assistance recommended at discharge Frequent or constant Supervision/Assistance Functional Status Assessment Patient has had Evaan Tidwell recent decline in their functional status and demonstrates the ability to make significant improvements in function in Runette Scifres reasonable and predictable amount of time.   02/06/2022   6:25 PM Frequency and Duration  Speech Therapy Frequency (ACUTE ONLY) min 2x/week Treatment Duration 1 week     02/06/2022   6:25 PM Oral Phase Oral Phase Impaired Oral - Mech Soft Impaired mastication Oral - Multi-Consistency NT Oral - Pill NT    02/06/2022   6:25 PM Pharyngeal Phase Pharyngeal Phase Impaired Pharyngeal- Nectar Teaspoon NT Pharyngeal- Nectar Cup Delayed swallow initiation-vallecula Pharyngeal Material does not enter airway Pharyngeal- Nectar Straw NT Pharyngeal- Thin Teaspoon NT Pharyngeal- Thin Cup Delayed swallow  initiation-vallecula;Reduced anterior laryngeal mobility;Reduced airway/laryngeal closure Pharyngeal Material enters airway, remains ABOVE vocal cords then ejected out;Material enters airway, passes BELOW cords and not ejected out despite cough attempt by patient Pharyngeal- Puree Delayed swallow initiation-vallecula Pharyngeal Material does not enter airway Pharyngeal- Mechanical Soft Delayed swallow initiation-vallecula Pharyngeal Material does not enter airway Pharyngeal- Regular NT Pharyngeal- Multi-consistency NT Pharyngeal- Pill NT    02/06/2022   6:25 PM Cervical Esophageal Phase  Cervical Esophageal Phase [facility] Elvina Sidle, M.S., CCC-SLP 02/06/2022, 6:50 PM                     DG Abd Portable 1V  Result Date: 02/04/2022 CLINICAL DATA:  Shortness of breath.  Colonic ileus. EXAM: PORTABLE ABDOMEN - 1 VIEW COMPARISON:  02/01/2022 FINDINGS: Large stool volume again noted. While contrast material seen in the right and transverse colon has progressed somewhat, there is persistent opacified stool in the left colon and rectum. No substantial gaseous small bowel dilatation. Bones are diffusely demineralized. IMPRESSION: Persistent large stool volume. Imaging features compatible with clinical constipation. Electronically Signed   By: Misty Stanley M.D.   On: 02/04/2022 07:31   DG Chest Port 1 View  Result Date: 02/04/2022 CLINICAL DATA:  Shortness of breath.  Drug overdose. EXAM: PORTABLE CHEST 1 VIEW COMPARISON:  02/01/2022 FINDINGS: 0638 hours. Tracheostomy tube again noted. Right PICC line tip overlies the distal SVC level near the junction with the RA. The cardio pericardial silhouette is enlarged. Interstitial markings are diffusely coarsened with chronic features. Minimal basilar atelectasis bilaterally. Telemetry leads overlie the chest. IMPRESSION: Stable. Cardiomegaly with chronic interstitial coarsening and minimal basilar atelectasis. Electronically Signed   By: Misty Stanley M.D.   On: 02/04/2022 07:29    DG Chest Port 1 View  Result Date: 02/01/2022 CLINICAL DATA:  56 year old female with history of shortness of breath. EXAM: PORTABLE CHEST 1 VIEW COMPARISON:  Chest x-ray 01/31/2022. FINDINGS: Mia Milan tracheostomy tube is in place with tip 5.7 cm above the carina. There is Woodward Klem right upper extremity PICC with tip terminating in the  superior cavoatrial junction. Lung volumes are low. Bibasilar opacities (right greater than left) likely subsegmental atelectasis. Blunting of the right costophrenic sulcus suggestive of Lorenza Shakir small right pleural effusion. No pneumothorax. Diffuse interstitial prominence and peribronchial cuffing. No confluent consolidative airspace disease. No evidence of pulmonary edema. Heart size is mildly enlarged. Upper mediastinal contours are within normal limits. IMPRESSION: 1. Support apparatus, as above. 2. Diffuse interstitial prominence and peribronchial cuffing, concerning for an acute bronchitis. 3. Small right pleural effusion. 4. Low lung volumes with bibasilar areas of subsegmental atelectasis. Electronically Signed   By: Vinnie Langton M.D.   On: 02/01/2022 08:18   DG Abd 1 View  Result Date: 02/01/2022 CLINICAL DATA:  56 year old female history of productive cough. Abdominal pain. EXAM: ABDOMEN - 1 VIEW COMPARISON:  01/31/2022. FINDINGS: There continues to be oral contrast material in the colon and rectum, with minimal progression compared to the prior study. No pathologic dilatation of small bowel or colon. No pneumoperitoneum. IMPRESSION: 1. Nonobstructive bowel gas pattern. 2. No pneumoperitoneum. Electronically Signed   By: Vinnie Langton M.D.   On: 02/01/2022 08:17   DG Chest Port 1 View  Result Date: 01/31/2022 CLINICAL DATA:  Shortness of breath.  Chest pain. EXAM: PORTABLE CHEST 1 VIEW COMPARISON:  January 28, 2022 FINDINGS: Audrina Marten right PICC line terminates in the central SVC. Stable cardiomegaly. The tracheostomy tube is stable. The hila and mediastinum are unremarkable. No  pneumothorax. No nodules or masses. Mild bibasilar subsegmental atelectasis. Possible tiny effusions. No evidence of pneumonia. No overt edema. IMPRESSION: Possible small effusions with underlying atelectasis. Possible mild pulmonary venous congestion. No focal infiltrate or overt edema. Electronically Signed   By: Dorise Bullion III M.D.   On: 01/31/2022 18:34   DG Abd 1 View  Result Date: 01/31/2022 CLINICAL DATA:  Abdominal pain. Drug overdose age. Follow-up study. EXAM: ABDOMEN - 1 VIEW COMPARISON:  01/29/2022 and older exams.  CT, 01/30/2022. FINDINGS: There is residual contrast outlining Jakeline Dave distended colon, contrast having progressed into the left colon since the prior exam. Colonic distension is without significant change. No small bowel dilation. Soft tissues are poorly defined, grossly unremarkable. IMPRESSION: 1. Findings similar to the recent prior exams. There is colonic distension suggesting Melba Araki colonic ileus. There has been some progression colonic contrast into the left colon since the previous abdominal radiographs. No evidence of bowel obstruction. Electronically Signed   By: Lajean Manes M.D.   On: 01/31/2022 15:22   US Abdomen Limited RUQ (LIVER/GB)  Result Date: 01/30/2022 CLINICAL DATA:  Cholelithiasis. EXAM: ULTRASOUND ABDOMEN LIMITED RIGHT UPPER QUADRANT COMPARISON:  Right upper quadrant abdominal ultrasound 05/06/2017, CT abdomen and pelvis without contrast 01/30/2022 FINDINGS: The study is technically limited due to overlying bowel-gas. Gallbladder: The gallbladder wall thickness measures 4 mm diffusely, mildly thickened. No gallstone is seen. Negative sonographic Murphy's sign. No pericholecystic fluid. Common bile duct: Diameter: 5 mm, within normal limits. Liver: No focal lesion identified. Limited visualization of the left hepatic lobe due to overlying bowel gas. Within normal limits in parenchymal echogenicity. Portal vein is patent on color Doppler imaging with normal direction  of blood flow towards the liver. Other: None. IMPRESSION: There is mild diffuse gallbladder wall thickening, however otherwise there is no sonographic evidence of acute cholecystitis. No gallstones. Gallbladder wall thickening is nonspecific and can be seen secondary to increased systemic fluid status and other etiologies including hypoalbuminemia, hepatitis, and chronic cholecystitis. Electronically Signed   By: Yvonne Kendall M.D.   On: 01/30/2022 13:58  CT ABDOMEN PELVIS WO CONTRAST  Result Date: 01/30/2022 CLINICAL DATA:  LEFT LOWER QUADRANT abdominal pain. Bowel obstruction suspected. EXAM: CT ABDOMEN AND PELVIS WITHOUT CONTRAST TECHNIQUE: Multidetector CT imaging of the abdomen and pelvis was performed following the standard protocol without IV contrast. RADIATION DOSE REDUCTION: This exam was performed according to the departmental dose-optimization program which includes automated exposure control, adjustment of the mA and/or kV according to patient size and/or use of iterative reconstruction technique. COMPARISON:  01/29/2022, 01/22/2022 FINDINGS: Lower chest: Interval resolution of bilateral pleural effusions. There is minimal residual consolidation in the RIGHT LOWER lobe and minimal subsegmental atelectasis in the RIGHT LOWER lobe. Heart size is normal. No pericardial effusion. Hepatobiliary: Noncontrast appearance of the liver is unremarkable. Gallbladder is partially obscured by contrast in the colon. However, gallbladder wall is possibly thickened, measuring up to 8 millimeters. Suspect radiopaque stone within the gallbladder. Pancreas:Partially obscured by contrast material. No abnormality identified. Spleen: Normal in size without focal abnormality. Adrenals/Urinary Tract: Adrenal glands are unremarkable. Kidneys are normal, without renal calculi, focal lesion, or hydronephrosis. Interval resolution of bilateral hydronephrosis. Bladder is distended and normal in appearance. There has been  resolution of bladder wall thickening since prior study. Stomach/Bowel: Status post gastric bypass surgery without evidence for obstruction. Visualized small bowel loops are unremarkable. Small bowel anastomosis in the LEFT LOWER QUADRANT. There is residual contrast in the colon, with dilatation of the cecum, measuring up to 8.4 centimeters. No colonic mass. Vascular/Lymphatic: There is atherosclerotic calcification of the abdominal aorta. Limited evaluation of vascular structures due to lack of intravenous contrast. Reproductive: Prior hysterectomy.  No adnexal mass. Other: No ascites. Body wall edema. Subcutaneous gas in the RIGHT flank, consistent with prior injection. Musculoskeletal: Remote wedge compression fracture of T9. No suspicious lytic or blastic lesions. IMPRESSION: 1. Improvement in bilateral hydronephrosis and bladder wall thickening. 2. Postoperative changes after gastric bypass surgery. 3. Improvement in RIGHT LOWER lobe consolidation and interval resolution of bilateral pleural effusions. 4. Suspect gallbladder wall thickening. Suspect radiopaque calculus. Consider further evaluation with RIGHT UPPER QUADRANT ultrasound. 5. Dilatation and retained contrast in the colon, without evidence for large bowel obstruction. 6.  Aortic atherosclerosis.  (ICD10-I70.0) 7. Hysterectomy. 8. Anasarca. 9. Remote wedge compression fracture T9. Electronically Signed   By: Nolon Nations M.D.   On: 01/30/2022 12:01   DG Abd 1 View  Result Date: 01/29/2022 CLINICAL DATA:  Abdominal pain EXAM: ABDOMEN - 1 VIEW COMPARISON:  Previous studies including the examination done earlier today FINDINGS: Bowel gas pattern is nonspecific. There is moderate distention of ascending and transverse colon. Oral contrast seen in the small bowel loops has moved into the colon. There is interval emptying of bowel contents in the left colon since the previous study. IMPRESSION: There is no small bowel dilation. There is moderate  distention of right colon, possibly suggesting ileus. There is interval passage of most of the small bowel contrast into the colon and emptying of contrast from the left colon since the study done earlier today. Electronically Signed   By: Elmer Picker M.D.   On: 01/29/2022 21:08   DG Abd Portable 1V  Result Date: 01/29/2022 CLINICAL DATA:  Abdominal distension beginning today. EXAM: PORTABLE ABDOMEN - 1 VIEW COMPARISON:  01/27/2022 FINDINGS: Contrast remains present within small and large bowel, likely indicating poor gastrointestinal motility and or constipation. No true obstructive pattern. IMPRESSION: Previously ingested contrast remains within small bowel and large bowel, likely indicating poor gastrointestinal motility and or constipation. No sign of  frank bowel obstruction. Electronically Signed   By: Nelson Chimes M.D.   On: 01/29/2022 10:50   DG Chest Port 1 View  Result Date: 01/28/2022 CLINICAL DATA:  Shortness of breath, check tracheostomy cannula. EXAM: PORTABLE CHEST 1 VIEW COMPARISON:  Portable chest 01/21/2022 FINDINGS: Right PICC again terminates at about the superior cavoatrial junction; tracheostomy cannula tip is 4.6 cm from the carina. Interval removal of feeding tube. There is stable cardiomegaly and mild central vascular and interstitial prominence consistent with mild interstitial edema. There is streaky atelectasis or infiltrates in both infrahilar areas. Please correlate clinically. The remaining lungs are generally clear. Today the pleural effusions do appear smaller than previously but in all other respects there are no further changes. IMPRESSION: 1. Persistent mild central vascular and interstitial prominence. 2. Small pleural effusions appear smaller than previously, with persistent bilateral infrahilar streaky atelectasis or pneumonia. 3. Support devices as above. Electronically Signed   By: Telford Nab M.D.   On: 01/28/2022 07:12   DG Abd Portable 1V  Result Date:  01/27/2022 CLINICAL DATA:  Ileus. EXAM: PORTABLE ABDOMEN - 1 VIEW COMPARISON:  January 20, 2022. FINDINGS: Feeding tube has been removed. Residual contrast is noted in the colon. No abnormal bowel dilatation is noted. IMPRESSION: No abnormal bowel dilatation. Electronically Signed   By: Marijo Conception M.D.   On: 01/27/2022 09:19   DG Swallowing Func-Speech Pathology  Result Date: 01/24/2022 Table formatting from the original result was not included. Objective Swallowing Evaluation: Type of Study: MBS-Modified Barium Swallow Study  Patient Details Name: NIKYA BUSLER MRN: [id number] Date of Birth: 1965-09-28 Today's Date: 01/24/2022 Time: SLP Start Time (ACUTE ONLY): 74 -SLP Stop Time (ACUTE ONLY): 9509 SLP Time Calculation (min) (ACUTE ONLY): 20 min Past Medical History: Past Medical History: Diagnosis Date  Abdominal pain   Accidental drug overdose April 2013  Anxiety   Atrial fibrillation ([place]) 09/29/11  converted spontaneously  Chronic back pain   Chronic knee pain   Chronic nausea   Chronic pain   COPD (chronic obstructive pulmonary disease) ([place])   Depression   Diabetes mellitus   states her doctor took her off all DM meds in past month  Diabetic neuropathy ([place])   Dyspnea   with exertion   GERD (gastroesophageal reflux disease)   Headache(784.0)   migraines   HTN (hypertension)   not on meds since in Ame[place] Chanda year   Hyperlipidemia   Hypothyroidism   not on meds in Dominic [place] while   Mental disorder   Bipolar and schizophrenic  Requires supplemental oxygen   as needed per patient   Schizophrenia ([place])   Schizophrenia, acute ([place]) 11/13/2017  Tobacco abuse  Past Surgical History: Past Surgical History: Procedure Laterality Date  ABDOMINAL HYSTERECTOMY    BLADDER SUSPENSION  03/04/2011  Procedure: [facility] PROCEDURE;  Surgeon: Elayne Snare MacDiarmid;  Location: [facility];  Service: Urology;  Laterality: N/Domique Clapper;  CYSTOCELE REPAIR  03/04/2011  Procedure: ANTERIOR REPAIR (CYSTOCELE);  Surgeon: Reece Packer;  Location: [facility];  Service:  Urology;  Laterality: N/Taquana Bartley;  CYSTOSCOPY  03/04/2011  Procedure: CYSTOSCOPY;  Surgeon: Elayne Snare MacDiarmid;  Location: [facility];  Service: Urology;  Laterality: N/Umberto Pavek;  ESOPHAGOGASTRODUODENOSCOPY (EGD) WITH PROPOFOL N/Kayia Billinger 05/12/2017  Procedure: ESOPHAGOGASTRODUODENOSCOPY (EGD) WITH PROPOFOL;  Surgeon: Alphonsa Overall, MD;  Location: [facility];  Service: General;  Laterality: N/Nikola Blackston;  GASTRIC ROUX-EN-Y N/Andrya Roppolo 03/25/2016  Procedure: LAPAROSCOPIC ROUX-EN-Y GASTRIC BYPASS WITH UPPER ENDOSCOPY;  Surgeon: Excell Seltzer, MD;  Location: [facility];  Service: General;  Laterality: N/Eban Weick;  KNEE SURGERY    LAPAROSCOPIC ASSISTED VAGINAL HYSTERECTOMY  03/04/2011  Procedure: LAPAROSCOPIC ASSISTED VAGINAL HYSTERECTOMY;  Surgeon: Cyril Mourning, MD;  Location: [facility];  Service: Gynecology;  Laterality: N/Ralyn Stlaurent; HPI: Pt is Jesten Cappuccio 56 y.o. female admitted 01/12/22 after suicide attempt with intentional drug overdose of promethazine. ETT 7/23-7/25; reintubated for hypoxia 7/26- trach 7/30. ATC initiated on 7/31. EEG 7/31: moderate severe diffuse encephalopathy, nonspecific etiology; no seizures. Cortrak placed 7/31. PMH: Afib, polysubstance abuse, COPD, DM, neuropathy, HTN, bipolar disorder, schizophrenia, anxiety, depression, ETOH abuse.  No data recorded  Recommendations for follow up therapy are one component of Viliami Bracco multi-disciplinary discharge planning process, led by the attending physician.  Recommendations may be updated based on patient status, additional functional criteria and insurance authorization. Assessment / Plan / Recommendation   01/24/2022   3:49 PM Clinical Impressions Clinical Impression The was study was completed with PMSV on and subsequently off. Pt presents with oropharyngeal dysphagia characterized by reduced bolus cohesion, reduced anterior laryngeal movement, and Shala Baumbach pharyngeal delay. She demonstrated mildly prolonged mastiation, and premature spillage to the valleculae. Penetration (PAS 3) was noted with large boluses of thin liquids via  straw when the PMSV was donned. Laryngeal invasion of thin liquids progressed to aspiration (PAS 7) with the PMSV doffed. Dinia Joynt dysphagia 2 diet with nectar thick liquids will be initiated at this time with observance of swallowing precautions including use of the PMSV during meals with full supervision. SLP will continue to follow pt and prognosis of advancement is judged to be good. SLP Visit Diagnosis Dysphagia, oropharyngeal phase (R13.12) Impact on safety and function Moderate aspiration risk     01/24/2022   3:49 PM Treatment Recommendations Treatment Recommendations Therapy as outlined in treatment plan below     01/24/2022   3:49 PM Prognosis Prognosis for Safe Diet Advancement Good   01/24/2022   3:49 PM Diet Recommendations SLP Diet Recommendations Dysphagia 2 (Fine chop) solids;Nectar thick liquid Liquid Administration via Cup;Straw Medication Administration Whole meds with puree Compensations Slow rate;Small sips/bites Postural Changes Seated upright at 90 degrees     01/24/2022   3:49 PM Other Recommendations Follow Up Recommendations Skilled nursing-short term rehab (<3 hours/day) Assistance recommended at discharge Frequent or constant Supervision/Assistance Functional Status Assessment Patient has had Catalina Salasar recent decline in their functional status and demonstrates the ability to make significant improvements in function in Dorell Gatlin reasonable and predictable amount of time.   01/24/2022   3:49 PM Frequency and Duration  Speech Therapy Frequency (ACUTE ONLY) min 2x/week Treatment Duration 2 weeks     01/24/2022   3:49 PM Oral Phase Oral Phase Impaired Oral - Nectar Cup WFL Oral - Nectar Straw WFL Oral - Thin Cup Decreased bolus cohesion;Premature spillage Oral - Thin Straw Decreased bolus cohesion;Premature spillage Oral - Puree WFL Oral - Regular Impaired mastication    01/24/2022   3:49 PM Pharyngeal Phase Pharyngeal Phase Impaired Pharyngeal- Nectar Cup Reduced anterior laryngeal mobility;Delayed swallow initiation-vallecula  Pharyngeal- Nectar Straw Reduced anterior laryngeal mobility;Delayed swallow initiation-vallecula Pharyngeal- Thin Cup Reduced anterior laryngeal mobility;Delayed swallow initiation-vallecula;Penetration/Aspiration during swallow Pharyngeal Material enters airway, remains ABOVE vocal cords and not ejected out;Material enters airway, passes BELOW cords and not ejected out despite cough attempt by patient Pharyngeal- Thin Straw Reduced anterior laryngeal mobility;Delayed swallow initiation-vallecula;Penetration/Aspiration during swallow;Trace aspiration Pharyngeal Material enters airway, remains ABOVE vocal cords and not ejected out;Material enters airway, passes BELOW cords and not ejected out despite cough attempt by patient Pharyngeal- Puree Reduced anterior laryngeal mobility;Delayed  swallow initiation-vallecula Pharyngeal- Regular Reduced anterior laryngeal mobility Pharyngeal- Pill Reduced anterior laryngeal mobility;Delayed swallow initiation-vallecula    01/24/2022   3:49 PM Cervical Esophageal Phase  Cervical Esophageal Phase [facility] Shanika I. Hardin Negus, [place], [place] Office number [phone] Horton Marshall 01/24/2022, 3:58 PM                     VAS [place] LOWER EXTREMITY VENOUS (DVT)  Result Date: 01/22/2022  Lower Venous DVT Study Patient Name:  MYLIAH MEDEL  Date of Exam:   01/22/2022 Medical Rec #: [id number]       Accession #:    [id number] Date of Birth: [birth year]      Patient Gender: F Patient Age:   36 years Exam Location:  [facility] Procedure:      VAS [place] LOWER EXTREMITY VENOUS (DVT) Referring Phys: Hillery Aldo --------------------------------------------------------------------------------  Indications: Leukocytosis.  Comparison Study: No previous exam noted. Performing Technologist: Bobetta Lime BS, RVT  Examination Guidelines: Johnothan Bascomb complete evaluation includes B-mode imaging, spectral Doppler, color Doppler, and power Doppler as needed of all accessible  portions of each vessel. Bilateral testing is considered an integral part of Keelynn Furgerson complete examination. Limited examinations for reoccurring indications may be performed as noted. The reflux portion of the exam is performed with the patient in reverse Trendelenburg.  +---------+---------------+---------+-----------+----------+--------------+ RIGHT    CompressibilityPhasicitySpontaneityPropertiesThrombus Aging +---------+---------------+---------+-----------+----------+--------------+ CFV      Full           Yes      Yes                                 +---------+---------------+---------+-----------+----------+--------------+ SFJ      Full                                                        +---------+---------------+---------+-----------+----------+--------------+ FV Prox  Full                                                        +---------+---------------+---------+-----------+----------+--------------+ FV Mid   Full                                                        +---------+---------------+---------+-----------+----------+--------------+ FV DistalFull                                                        +---------+---------------+---------+-----------+----------+--------------+ PFV      Full                                                        +---------+---------------+---------+-----------+----------+--------------+  POP      Full           Yes      Yes                                 +---------+---------------+---------+-----------+----------+--------------+ PTV      Full                                                        +---------+---------------+---------+-----------+----------+--------------+ PERO     Full                                                        +---------+---------------+---------+-----------+----------+--------------+   +---------+---------------+---------+-----------+----------+--------------+ LEFT      CompressibilityPhasicitySpontaneityPropertiesThrombus Aging +---------+---------------+---------+-----------+----------+--------------+ CFV      Full           Yes      Yes                                 +---------+---------------+---------+-----------+----------+--------------+ SFJ      Full                                                        +---------+---------------+---------+-----------+----------+--------------+ FV Prox  Full                                                        +---------+---------------+---------+-----------+----------+--------------+ FV Mid   Full                                                        +---------+---------------+---------+-----------+----------+--------------+ FV DistalFull                                                        +---------+---------------+---------+-----------+----------+--------------+ PFV      Full                                                        +---------+---------------+---------+-----------+----------+--------------+ POP      Full           Yes      Yes                                 +---------+---------------+---------+-----------+----------+--------------+  PTV      Full                                                        +---------+---------------+---------+-----------+----------+--------------+ PERO     Full                                                        +---------+---------------+---------+-----------+----------+--------------+     Summary: BILATERAL: - No evidence of deep vein thrombosis seen in the lower extremities, bilaterally. -No evidence of popliteal cyst, bilaterally.   *See table(s) above for measurements and observations. Electronically signed by Orlie Pollen on 01/22/2022 at 7:28:24 PM.    Final    CT ABDOMEN PELVIS WO CONTRAST  Result Date: 01/22/2022 CLINICAL DATA:  Abdominal pain, history of ileus, overdose on RIGHT gland, history atrial  fibrillation, COPD, diabetes mellitus, hypertension, smoker EXAM: CT ABDOMEN AND PELVIS WITHOUT CONTRAST TECHNIQUE: Multidetector CT imaging of the abdomen and pelvis was performed following the standard protocol without IV contrast. RADIATION DOSE REDUCTION: This exam was performed according to the departmental dose-optimization program which includes automated exposure control, adjustment of the mA and/or kV according to patient size and/or use of iterative reconstruction technique. COMPARISON:  01/16/2022 FINDINGS: Lower chest: Bibasilar pleural effusions and atelectasis. Additional patchy infiltrates in RIGHT middle lobe and lingula. Hepatobiliary: Gallbladder and liver unremarkable for technique Pancreas: Normal appearance Spleen: Normal appearance Adrenals/Urinary Tract: Adrenal glands unremarkable. No renal masses. BILATERAL renal collecting system dilatation. Markedly thickened bladder wall of bladder is underdistended. Stomach/Bowel: Stool within RIGHT colon. Normal appendix. Post gastric bypass surgery. Nasogastric tube in stomach. Contrast opacifies multiple upper normal caliber small bowel loops without definite evidence of wall thickening or obstruction. Vascular/Lymphatic: Atherosclerotic calcifications aorta. No definite adenopathy. Reproductive: Post hysterectomy.  Atrophic ovaries. Other: No free air free fluid.  No definite hernia. Musculoskeletal: Chronic compression fracture of T9. Osseous structures otherwise unremarkable IMPRESSION: Bibasilar pleural effusions and atelectasis with additional patchy infiltrates in RIGHT middle lobe and lingula. BILATERAL renal collecting system dilatation with marked thickening of the bladder wall, question cystitis, recommend correlation with urinalysis. Post gastric bypass surgery. Chronic compression fracture of T9. No definite acute intra-abdominal or intrapelvic abnormalities. Aortic Atherosclerosis (ICD10-I70.0). Electronically Signed   By: Lavonia Dana  M.D.   On: 01/22/2022 11:44   DG CHEST PORT 1 VIEW  Result Date: 01/21/2022 CLINICAL DATA:  Tracheostomy, respiratory failure, drug overdose EXAM: PORTABLE CHEST 1 VIEW COMPARISON:  01/19/2022 FINDINGS: Interval placement of non weighted enteric feeding tube, tip below the diaphragm. No other significant change in AP portable chest radiograph. Probable small, layering bilateral pleural effusions. No acute appearing airspace opacity. Cardiomegaly. Tracheostomy and right upper extremity PICC, unchanged. IMPRESSION: 1. Interval placement of non weighted enteric feeding tube, tip below the diaphragm. Other support apparatus unchanged. 2. Probable small, layering bilateral pleural effusions. No acute appearing airspace opacity. 3. Cardiomegaly. Electronically Signed   By: Delanna Ahmadi M.D.   On: 01/21/2022 08:26   DG Abd Portable 1V  Result Date: 01/20/2022 CLINICAL DATA:  Drug overdose EXAM: PORTABLE ABDOMEN - 1 VIEW COMPARISON:  Radiograph 01/20/2022 FINDINGS: Enteric tube tip overlies the left hemiabdomen, likely  overlying proximal small bowel (patient has history of Roux-en-Y gastric bypass. Persistent small bowel and colonic distension. Colonic distention has mildly improved. There is contrast within the colon. IMPRESSION: Persistent bowel distension with mild improvement in comparison to prior exam. Enteric tube tip overlies the left hemiabdomen, likely overlying proximal small bowel. Electronically Signed   By: Maurine Simmering M.D.   On: 01/20/2022 16:16   DG Abd 1 View  Result Date: 01/20/2022 CLINICAL DATA:  Evaluate ileus. EXAM: ABDOMEN - 1 VIEW COMPARISON:  Radiographs 01/16/2022 and 01/15/2022.  CT 01/16/2022. FINDINGS: 1243 hours. Colonic distention appears mildly improved compared with the prior examinations. Contrast material administered for the CT has passed into the colon. No extraluminal contrast or air collections identified on this portable supine examination. The bones appear unremarkable.  IMPRESSION: Improving colonic distension, consistent with improving ileus or fecal impaction. Electronically Signed   By: Richardean Sale M.D.   On: 01/20/2022 13:02   DG Chest Port 1 View  Result Date: 01/19/2022 CLINICAL DATA:  Emersyn Wyss 56 year old presents following tracheostomy placement. EXAM: PORTABLE CHEST 1 VIEW COMPARISON:  January 18, 2022 FINDINGS: Endotracheal tube now removed, placement of tracheostomy with tip just below clavicular heads. EKG leads project over the chest. RIGHT-sided PICC line terminates in the area of the caval to atrial junction. Since the previous exam there is been worsening of basilar airspace disease. Suspect complete collapse of RIGHT middle and partial collapse of RIGHT lower lobe. Cardiomediastinal contours and hilar structures are otherwise stable. LEFT lower lobe with obscured LEFT hemidiaphragm. No visible pneumothorax. On limited assessment no acute skeletal findings. IMPRESSION: 1. Tracheostomy with tip just below clavicular heads. 2. Worsening of basilar airspace disease. Particularly at the RIGHT lung base. Consider the possibility of developing pneumonia or aspiration. Suggest close attention on follow-up. Electronically Signed   By: Zetta Bills M.D.   On: 01/19/2022 12:54   EEG adult  Result Date: 01/19/2022 Lora Havens, MD     01/19/2022 12:56 PM Patient Name: TAJIA SZELIGA MRN: [id number] Epilepsy Attending: Lora Havens Referring Physician/Provider: Greta Doom, MD Date: 7/302023 Duration: 23.17 mins  Patient history: 56 year old female with altered mental status.  EEG to evaluate for seizure.  Level of alertness: awake  AEDs during EEG study: LEV, Phenobarb  Technical aspects: This EEG study was done with scalp electrodes positioned according to the 10-20 International system of electrode placement. Electrical activity was acquired at Cypress Fanfan sampling rate of '500Hz'$  and reviewed with Tiphanie Vo high frequency filter of '70Hz'$  and Garfield Coiner low frequency filter of '1Hz'$ .  EEG data were recorded continuously and digitally stored.  Description: No posterior dominant rhythm was seen. EEG showed continuous generalized 3 to 6 Hz  theta-delta slowing admixed with 12 to 14 Hz beta activity. Hyperventilation and photic stimulation were not performed.   Of note, study was technically difficult due to significant movement and electrode artifact.  ABNORMALITY - Continuous slow, generalized  IMPRESSION: This technically difficult study is suggestive of moderate severe diffuse encephalopathy, nonspecific etiology. No seizures were seen throughout the recording. Please consider repeating study if concern for ictal-iterictal activity persists.   Lora Havens   DG CHEST PORT 1 VIEW  Result Date: 01/18/2022 CLINICAL DATA:  Endotracheal tube. EXAM: PORTABLE CHEST 1 VIEW COMPARISON:  Chest x-ray 01/18/2022 FINDINGS: Endotracheal tube tip is 5 cm above the carina. Enteric tube tip is folded on itself with distal portion in the distal esophagus, unchanged. Right-sided central venous catheter tip projects over the right atrium.  Cardiomediastinal silhouette appears stable and within normal limits. Small pleural effusions persist. No pneumothorax. No new focal lung infiltrate. Osseous structures are stable. IMPRESSION: 1. Endotracheal tube tip 5 cm above carina. 2. Nasogastric tube tip malpositioned and folded on itself with distal tip in the distal esophagus. Findings are unchanged. 3. Stable small pleural effusions. Electronically Signed   By: Ronney Asters M.D.   On: 01/18/2022 16:59   DG CHEST PORT 1 VIEW  Result Date: 01/18/2022 CLINICAL DATA:  Respiratory failure EXAM: PORTABLE CHEST 1 VIEW COMPARISON:  01/16/2022 FINDINGS: Interval placement of right upper extremity PICC line with distal tip terminating at the superior cavoatrial junction. ET tube terminates 2.4 cm above the carina. NG tube is kinked at the level of the side port within the proximal stomach with distal tip directed  superiorly within the distal esophagus. Large bore feeding tube extends into the stomach, distal tip beyond the inferior margin of the film. Stable heart size. Progressive right basilar airspace opacity. Streaky left basilar opacity is also slightly progressed. Small bilateral pleural effusions. No pneumothorax. IMPRESSION: 1. Interval placement of right upper extremity PICC line with distal tip terminating at the superior cavoatrial junction. No pneumothorax. 2. NG tube is kinked at the level of the side port within the proximal stomach with distal tip directed superiorly within the distal esophagus. Recommend repositioning. 3. Progressive bibasilar airspace opacities, right greater than left. 4. Small bilateral pleural effusions. Electronically Signed   By: Davina Poke D.O.   On: 01/18/2022 09:28   [place] EKG SITE RITE  Result Date: 01/16/2022 If Site Rite image not attached, placement could not be confirmed due to current cardiac rhythm.  CT ABDOMEN PELVIS WO CONTRAST  Result Date: 01/16/2022 CLINICAL DATA:  Abdominal tenderness, lack of bowel movement EXAM: CT ABDOMEN AND PELVIS WITHOUT CONTRAST TECHNIQUE: Multidetector CT imaging of the abdomen and pelvis was performed following the standard protocol without IV contrast. RADIATION DOSE REDUCTION: This exam was performed according to the departmental dose-optimization program which includes automated exposure control, adjustment of the mA and/or kV according to patient size and/or use of iterative reconstruction technique. COMPARISON:  06/27/2017 FINDINGS: Lower chest: Small left pleural effusion. Small right pleural effusion. Right lower lobe airspace disease concerning for pneumonia. Hepatobiliary: No focal liver abnormality is seen. Distended gallbladder. No gallstones, gallbladder wall thickening, or biliary dilatation. Pancreas: Unremarkable. No pancreatic ductal dilatation or surrounding inflammatory changes. Spleen: Normal in size without focal  abnormality. Adrenals/Urinary Tract: Adrenal glands are unremarkable. Kidneys are normal, without renal calculi, focal lesion, or hydronephrosis. Bladder is unremarkable. Stomach/Bowel: Stomach is within normal limits. No evidence of bowel wall thickening, distention, or inflammatory changes. Insert gastric tube in the stomach. Large amount of stool in the ascending colon. Moderate amount of stool in the descending colon. Gaseous distension of the transverse colon. Mild distension of the small bowel. Contrast opacifies the small bowel to the level of the distal ileum. Postsurgical changes in the small bowel in the left mid abdomen with an anastomotic suture line. No pneumatosis, pneumoperitoneum or portal venous gas. Vascular/Lymphatic: Aortic atherosclerosis. No enlarged abdominal or pelvic lymph nodes. Reproductive: Status post hysterectomy. No adnexal masses. Other: Small volume perihepatic ascites. Musculoskeletal: No acute osseous abnormality. No aggressive osseous lesion. IMPRESSION: 1. Large amount of stool in the ascending colon. Moderate amount of stool in the descending colon. Gaseous distension of the transverse colon. Mild distension of the small bowel. Contrast opacifies the small bowel to the level of the distal ileum. No bowel  obstruction. Nasogastric tube in satisfactory position. 2. Small volume perihepatic ascites. 3. Small bilateral pleural effusions. Right lower lobe airspace disease concerning for pneumonia. 4. Aortic Atherosclerosis (ICD10-I70.0). Electronically Signed   By: Kathreen Devoid M.D.   On: 01/16/2022 14:16   DG Chest Port 1 View  Result Date: 01/16/2022 CLINICAL DATA:  Respiratory failure EXAM: PORTABLE CHEST 1 VIEW COMPARISON:  Previous studies including the examination of 01/15/2022 FINDINGS: Tip of endotracheal tube is 3.7 cm above the carina. Enteric tubes are noted traversing the esophagus. Cardiac size is within normal limits. Increased density in the lower lung fields may be  due to small effusions and subsegmental atelectasis. There is slight improvement in aeration in left lower lung field. There are no new infiltrates or signs of pulmonary edema. IMPRESSION: There is improvement in the aeration in the left lower lung field suggesting decrease in atelectasis. Electronically Signed   By: Elmer Picker M.D.   On: 01/16/2022 08:22   DG Abd 1 View  Result Date: 01/16/2022 CLINICAL DATA:  Ileus EXAM: ABDOMEN - 1 VIEW COMPARISON:  01/15/2022 FINDINGS: Large bore feeding tube appropriately positioned in the stomach. Small bore NG tube remains kinked at the level of the side port, also located within the stomach. Similar degree of prominent gaseous distension of the colon with moderate-large stool burden. IMPRESSION: 1. Similar degree of prominent gaseous distension of the colon with moderate-large stool burden. 2. NG tube remains kinked within the stomach. Electronically Signed   By: Davina Poke D.O.   On: 01/16/2022 08:22   MR BRAIN WO CONTRAST  Result Date: 01/16/2022 CLINICAL DATA:  Initial evaluation for delirium. EXAM: MRI HEAD WITHOUT CONTRAST TECHNIQUE: Multiplanar, multiecho pulse sequences of the brain and surrounding structures were obtained without intravenous contrast. COMPARISON:  Comparison made with prior CT from 11/13/2021. FINDINGS: Brain: Cerebral volume within normal limits. No significant cerebral white matter disease or other focal parenchymal signal abnormality. No evidence for acute or subacute infarct. Gray-white matter differentiation maintained. No areas of chronic cortical infarction. No acute or chronic intracranial blood products. No mass lesion, midline shift or mass effect. No hydrocephalus or extra-axial fluid collection. Pituitary gland and suprasellar region within normal limits. Vascular: Major intracranial vascular flow voids are maintained. Skull and upper cervical spine: Craniocervical junction within normal limits. Bone marrow signal  intensity diffusely decreased on T1 weighted sequence, nonspecific, but most commonly related to anemia, smoking or obesity. No focal marrow replacing lesion. No scalp soft tissue abnormality. Sinuses/Orbits: Globes orbital soft tissues within normal limits. Nasogastric tube in place. Secondary small volume fluid noted within the nasopharynx with bilateral mastoid effusions. Paranasal sinuses are otherwise largely clear. Other: None. IMPRESSION: Normal brain MRI.  No acute intracranial abnormality identified. Electronically Signed   By: Jeannine Boga M.D.   On: 01/16/2022 05:19   DG Abd 1 View  Result Date: 01/15/2022 CLINICAL DATA:  Feeding tube. EXAM: ABDOMEN - 1 VIEW COMPARISON:  Same day. FINDINGS: Distal tip of feeding tube is seen expected position of the stomach. There is been interval placement of nasogastric tube which appears to be kinked in the proximal stomach. Air-filled colon is noted. IMPRESSION: Distal tip of feeding tube is seen in expected position of the stomach. Interval placement of nasogastric tube which appears to be kinked in the proximal stomach. Electronically Signed   By: Marijo Conception M.D.   On: 01/15/2022 21:02   DG Abd Portable 1V  Result Date: 01/15/2022 CLINICAL DATA:  Feeding tube adjusted. EXAM:  PORTABLE ABDOMEN - 1 VIEW COMPARISON:  Earlier same day FINDINGS: Soft feeding tube has its tip in the mid body of the stomach. Previously seen kink no longer present. Guidewire remains in place. IMPRESSION: Feeding tube tip in the mid body of the stomach. Kink has been straightened out. Electronically Signed   By: Nelson Chimes M.D.   On: 01/15/2022 12:06   DG Abd Portable 1V  Result Date: 01/15/2022 CLINICAL DATA:  Ileus. EXAM: PORTABLE ABDOMEN - 1 VIEW COMPARISON:  01/14/2022; 01/13/2022; 01/12/2022; CT abdomen pelvis-06/27/2017 FINDINGS: Weighted tip enteric tube overlies expected location of the mid body of the stomach. Persistent marked gaseous distention the colon  with air seen to the level of the rectum. No definitive evidence of volvulus. There is no significant gaseous distention of the small bowel. Nondiagnostic evaluation for pneumoperitoneum secondary to supine positioning and exclusion of the lower thorax. No pneumatosis or portal venous gas. Calcifications overlying the medial aspect of the lower abdomen/pelvis likely represent phleboliths as demonstrated on remote abdominal CT. No definite acute osseous abnormalities. IMPRESSION: Marked gaseous distention of the colon without definitive evidence of enteric obstruction or volvulus, findings most compatible with provided history of ileus. Electronically Signed   By: Sandi Mariscal M.D.   On: 01/15/2022 10:48   DG Chest Port 1 View  Result Date: 01/15/2022 CLINICAL DATA:  [id number], respiratory issues EXAM: PORTABLE CHEST 1 VIEW COMPARISON:  Radiograph dated January 12, 2022 FINDINGS: The cardiomediastinal silhouette is unchanged in contour.Enteric tube tip terminates over the stomach. There is apparent kinking of the enteric tube within the stomach. There are favored small bilateral pleural effusions. Bibasilar opacities, possibly atelectasis. Mild interstitial prominence. Gaseous distension of colon. IMPRESSION: 1. Enteric tube appears kinked within the stomach. Recommend correlation with function. 2. Gaseous distension of colon. 3. Constellation of findings are favored to reflect pulmonary edema with atelectasis and small bilateral pleural effusions. Electronically Signed   By: Valentino Saxon M.D.   On: 01/15/2022 07:51   DG Chest 1 View  Result Date: 01/15/2022 CLINICAL DATA:  56 year old female status post endotracheal tube placement. EXAM: CHEST  1 VIEW COMPARISON:  Chest x-ray 01/15/2022. FINDINGS: Patient is now intubated, with the tip of the endotracheal tube 3.8 cm above the carina. Feeding tube extends into the stomach. Lung volumes are low. Bibasilar opacities may reflect areas of atelectasis and/or  consolidation. Small bilateral pleural effusions. No pneumothorax. No evidence of pulmonary edema. Heart size is borderline enlarged. Upper mediastinal contours are within normal limits. IMPRESSION: 1. Support apparatus, as above. 2. Low lung volumes with bibasilar areas of atelectasis and/or consolidation and superimposed small bilateral pleural effusions. Electronically Signed   By: Vinnie Langton M.D.   On: 01/15/2022 06:57   DG Abd Portable 1V  Result Date: 01/14/2022 CLINICAL DATA:  Feeding tube placement EXAM: PORTABLE ABDOMEN - 1 VIEW COMPARISON:  Previous studies including the examination done earlier today FINDINGS: Tip of feeding tube is seen in the region of antrum of the stomach. Kink seen in the distal course of the feeding tube in the immediate previous study is not seen in the current study. There is Hang Ammon guidewire in the lumen of the feeding tube. No interval changes are noted in small patchy infiltrate in left lower lung field. IMPRESSION: Tip of feeding tube is seen in the region of antrum of the stomach. Electronically Signed   By: Elmer Picker M.D.   On: 01/14/2022 12:59   DG Abd Portable 1V  Result Date: 01/14/2022 CLINICAL  DATA:  Feeding tube placement EXAM: PORTABLE ABDOMEN - 1 VIEW COMPARISON:  01/13/2022 FINDINGS: There is interval exchange of feeding tube for NG tube. Tip of feeding tube is seen in the region of the antrum of the stomach. Distal portion of feeding tube is coiled within the fundus of the stomach. There is gaseous dilation of transverse colon. Moderate to large amount of stool is seen in colon. There is no significant small bowel dilation. Pelvis is not included in its entirety. Patchy density seen in medial left lower lung field may suggest atelectasis/pneumonia. IMPRESSION: Tip of feeding tube is seen in the region of body of the stomach. Small infiltrate in the medial left lower lung fields suggesting atelectasis/pneumonia. Electronically Signed   By: Elmer Picker M.D.   On: 01/14/2022 12:56   Overnight EEG with video  Result Date: 01/14/2022 Lora Havens, MD     01/15/2022  8:51 AM Patient Name: SYANA DEGRAFFENREID MRN: [id number] Epilepsy Attending: Lora Havens Referring Physician/Provider: Lestine Mount, PA-C Duration: 01/13/2022 1402 to 01/14/2022 1402  Patient history: 56 year old female with altered mental status.  EEG to evaluate for seizure.  Level of alertness:  comatose  AEDs during EEG study: Propofol, LEV  Technical aspects: This EEG study was done with scalp electrodes positioned according to the 10-20 International system of electrode placement. Electrical activity was acquired at Brocha Gilliam sampling rate of '500Hz'$  and reviewed with Quint Chestnut high frequency filter of '70Hz'$  and Hoke Baer low frequency filter of '1Hz'$ . EEG data were recorded continuously and digitally stored.  Description: EEG showed continuous generalized 3 to 6 Hz  theta-delta slowing admixed with 12 to 14 Hz beta activity. Abundant generalized spikes and polyspikes were noted at times more prominent in the left and right frontal region. Hyperventilation and photic stimulation were not performed.    ABNORMALITY - Spike, generalized - Continuous slow, generalized  IMPRESSION: This study showed evidence of epileptogenicity with generalized onset but at times appeared to have left and right frontal predominance. Additionally there is moderate to severe diffuse encephalopathy, nonspecific etiology. No seizures were seen throughout the recording.  Lora Havens   DG Abd Portable 1V  Result Date: 01/13/2022 CLINICAL DATA:  Feeding tube placement EXAM: PORTABLE ABDOMEN - 1 VIEW COMPARISON:  01/12/2022 FINDINGS: Feeding tube has been advanced with the tip in the mid stomach and the side port in the proximal stomach. IMPRESSION: Feeding tube tip in the mid stomach. Electronically Signed   By: Rolm Baptise M.D.   On: 01/13/2022 15:29   EEG adult  Result Date: 01/13/2022 Lora Havens, MD      01/13/2022 12:05 PM Patient Name: GEANINE VANDEKAMP MRN: [id number] Epilepsy Attending: Lora Havens Referring Physician/Provider: Lestine Mount, PA-C Date: 01/13/2022 Duration: 22.06 mins Patient history: 56 year old female with altered mental status.  EEG to evaluate for seizure. Level of alertness:  comatose AEDs during EEG study: Propofol Technical aspects: This EEG study was done with scalp electrodes positioned according to the 10-20 International system of electrode placement. Electrical activity was acquired at Eldin Bonsell sampling rate of '500Hz'$  and reviewed with Ashlan Dignan high frequency filter of '70Hz'$  and Isra Lindy low frequency filter of '1Hz'$ . EEG data were recorded continuously and digitally stored. Description: EEG showed continuous generalized 3 to 6 Hz  theta-delta slowing admixed with 12 to 14 Hz beta activity.  Abundant spikes and polyspikes were noted in left frontal region.  Intermittent spikes were also noted in right frontal region independent from left frontal  region.  Hyperventilation and photic stimulation were not performed.   ABNORMALITY - Spike, left and right frontal region - Continuous slow, generalized IMPRESSION: This study showed evidence of independent epileptogenicity arising from left> right frontal region. Additionally there is moderate to severe diffuse encephalopathy, nonspecific etiology. No seizures were seen throughout the recording. Lora Havens    Microbiology: Recent Results (from the past 240 hour(s))  SARS Coronavirus 2 by RT PCR (hospital order, performed in [facility] hospital lab) *cepheid single result test* Anterior Nasal Swab     Status: None   Collection Time: 02/11/22  9:26 AM   Specimen: Anterior Nasal Swab  Result Value Ref Range Status   SARS Coronavirus 2 by RT PCR NEGATIVE NEGATIVE Final    Comment: (NOTE) SARS-CoV-2 target nucleic acids are NOT DETECTED.  The SARS-CoV-2 RNA is generally detectable in upper and lower respiratory specimens during the acute phase of  infection. The lowest concentration of SARS-CoV-2 viral copies this assay can detect is 250 copies / mL. Lovelace Cerveny negative result does not preclude SARS-CoV-2 infection and should not be used as the sole basis for treatment or other patient management decisions.  Sina Lucchesi negative result may occur with improper specimen collection / handling, submission of specimen other than nasopharyngeal swab, presence of viral mutation(s) within the areas targeted by this assay, and inadequate number of viral copies (<250 copies / mL). Abena Erdman negative result must be combined with clinical observations, patient history, and epidemiological information.  Fact Sheet for Patients:   https://www.patel.info/  Fact Sheet for Healthcare Providers: https://hall.com/  This test is not yet approved or  cleared by the [place] FDA and has been authorized for detection and/or diagnosis of SARS-CoV-2 by FDA under an Emergency Use Authorization (EUA).  This EUA will remain in effect (meaning this test can be used) for the duration of the COVID-19 declaration under Section 564(b)(1) of the Act, 21 U.S.C. section 360bbb-3(b)(1), unless the authorization is terminated or revoked sooner.  Performed at [facility], [place] [address] [place], [place] [address]      Labs: Basic Metabolic Panel: Recent Labs  Lab 02/06/22 0032 02/07/22 0030 02/08/22 0015 02/11/22 0204  NA 136 137 136 136  K 4.2 4.7 3.8 4.2  CL 103 104 105 99  CO2 '28 29 27 28  '$ GLUCOSE 157* 65* 264* 125*  BUN 10 6 5* 8  CREATININE 0.37* 0.42* 0.41* 0.35*  CALCIUM 7.5* 7.9* 7.7* 8.3*  MG 2.0 2.0  --  1.8   Liver Function Tests: Recent Labs  Lab 02/06/22 0032 02/07/22 0030 02/08/22 0015  AST 33 29 59*  ALT 63* 53* 61*  ALKPHOS 122 111 110  BILITOT 0.5 0.3 0.4  PROT 6.7 6.7 6.4*  ALBUMIN 1.6* 1.8* 1.8*   No results for input(s): "LIPASE", "AMYLASE" in the last 168 hours. No results for input(s):  "AMMONIA" in the last 168 hours. CBC: Recent Labs  Lab 02/06/22 0032 02/07/22 0030 02/08/22 0015 02/11/22 0204  WBC 10.5 8.2 6.8 8.9  NEUTROABS 8.8* 4.8 5.4 5.6  HGB 7.4* 7.9* 8.0* 8.9*  HCT 24.7* 26.9* 27.2* 29.6*  MCV 73.7* 73.9* 76.0* 77.5*  PLT 303 334 307 311   Cardiac Enzymes: Recent Labs  Lab 02/10/22 1806  CKTOTAL 60   BNP: BNP (last 3 results) Recent Labs    02/04/22 0500 02/05/22 0706 02/11/22 0204  BNP 58.2 74.0 31.7    ProBNP (last 3 results) No results for input(s): "PROBNP" in the last 8760  hours.  CBG: Recent Labs  Lab 02/11/22 0741 02/11/22 1122 02/11/22 2002 02/12/22 0750 02/12/22 1103  GLUCAP 195* 145* 204* 221* 182*       Signed:  Fayrene Helper MD.  [facility] 02/12/2022, 11:36 AM

## 2022-02-12 NOTE — TOC Progression Note (Signed)
Transition of Care [facility]) - Progression Note    Patient Details  Name: [name] MRN: [id number] Date of Birth: 1965-07-07  [facility] CM/SW Contact  Joanne Chars, LCSW Phone Number: 02/12/2022, 8:39 AM  Clinical Narrative:   Phone call received from [facility].  They can accept pt today.  Accepting MD: Selinda Flavin.  Transport to [facility] main campus.  RN report to [phone].  MD informed.     Expected Discharge Plan:  (inpatient psych) Barriers to Discharge: Continued Medical Work up  Expected Discharge Plan and Services Expected Discharge Plan:  (inpatient psych) In-house Referral: Clinical Social Work Discharge Planning Services: CM Consult   Living arrangements for the past 2 months: [place] Determinants of Health (SDOH) Interventions    Readmission Risk Interventions    07/27/2020   11:06 AM  Readmission Risk Prevention Plan  Transportation Screening Complete  PCP or Specialist Appt within 5-7 Days Complete  Home Care Screening Complete

## 2022-02-12 NOTE — Assessment & Plan Note (Signed)
No signs of exacerbation Continue meds

## 2022-02-12 NOTE — Assessment & Plan Note (Signed)
Continue amlodipine, metoprolol 

## 2022-02-12 NOTE — Progress Notes (Signed)
[name] was called and spoke to [name], report was given.

## 2022-02-12 NOTE — Assessment & Plan Note (Signed)
SSI, metformin at discharge Adjust as needed

## 2022-02-12 NOTE — Assessment & Plan Note (Signed)
Bowel regimen, on linzess, lactulose Would continue to adjust outpatient as needed

## 2022-02-12 NOTE — Progress Notes (Signed)
OT Cancellation Note  Patient Details Name: [name] MRN: [id number] DOB: 1966-02-16   Cancelled Treatment:    Reason Eval/Treat Not Completed: Patient's level of consciousness  Jeri Modena 02/12/2022, 8:49 AM

## 2022-02-12 NOTE — Assessment & Plan Note (Signed)
PPI ?

## 2022-02-12 NOTE — Assessment & Plan Note (Signed)
Alcohol and promethazine overdose, thought intentional overdose, suicide attempt Known hx schizoaffective disorder bipolar type and hx substance abuse Currently on scheduled ativan, risperdal BID prn, gabapentin Continued psychiatric medications to be adjusted per psychiatry  Delirium precautions Safety sitter

## 2022-03-05 ENCOUNTER — Ambulatory Visit: Payer: Self-pay

## 2022-03-05 NOTE — Telephone Encounter (Signed)
I spoke with patients daughter and informed her that I have no New pt appointments available anytime soon.  I did mention to her about the PCP that is currently showing on her mother's chart. I also emailed her a list of other clinics that she may want to reach out to to see if they could get her mother in sooner.

## 2022-03-05 NOTE — Telephone Encounter (Signed)
   Chief Complaint: Daughter requesting mother be seen for new pt. As soon as possible.Recently hospitalized. Symptoms: Has SOB, but pt. Has a history of COPD. Frequency: August Pertinent Negatives: Patient denies  Disposition: '[]'$ ED /'[]'$ Urgent Care (no appt availability in office) / '[]'$ Appointment(In office/virtual)/ '[]'$  [facility]/ '[]'$ Home Care/ '[]'$ Refused Recommended Disposition /'[]'$ [facility]/ '[x]'$  Follow-up with PCP Additional Notes: Please advise pt.'s daughter, cherish.   Answer Assessment - Initial Assessment Questions 1. RESPIRATORY STATUS: "Describe your breathing?" (e.g., wheezing, shortness of breath, unable to speak, severe coughing)      SOB 2. ONSET: "When did this breathing problem begin?"      A long time per daughter 3. PATTERN "Does the difficult breathing come and go, or has it been constant since it started?"      Comes and goes 4. SEVERITY: "How bad is your breathing?" (e.g., mild, moderate, severe)    - MILD: No SOB at rest, mild SOB with walking, speaks normally in sentences, can lie down, no retractions, pulse < 100.    - MODERATE: SOB at rest, SOB with minimal exertion and prefers to sit, cannot lie down flat, speaks in phrases, mild retractions, audible wheezing, pulse 100-120.    - SEVERE: Very SOB at rest, speaks in single words, struggling to breathe, sitting hunched forward, retractions, pulse > 120      Moderate 5. RECURRENT SYMPTOM: "Have you had difficulty breathing before?" If Yes, ask: "When was the last time?" and "What happened that time?"      Yes, has COPD 6. CARDIAC HISTORY: "Do you have any history of heart disease?" (e.g., heart attack, angina, bypass surgery, angioplasty)      Yes 7. LUNG HISTORY: "Do you have any history of lung disease?"  (e.g., pulmonary embolus, asthma, emphysema)     COPD 8. CAUSE: "What do you think is causing the breathing problem?"      COPD 9. OTHER SYMPTOMS: "Do you have any other symptoms? (e.g.,  dizziness, runny nose, cough, chest pain, fever)     Abdominal pain 10. O2 SATURATION MONITOR:  "Do you use an oxygen saturation monitor (pulse oximeter) at home?" If Yes, ask: "What is your reading (oxygen level) today?" "What is your usual oxygen saturation reading?" (e.g., 95%)       NO 11. PREGNANCY: "Is there any chance you are pregnant?" "When was your last menstrual period?"       No 12. TRAVEL: "Have you traveled out of the country in the last month?" (e.g., travel history, exposures)       No  Protocols used: Breathing Difficulty-A-AH

## 2022-03-17 ENCOUNTER — Ambulatory Visit: Payer: Medicare Other | Admitting: Student

## 2022-03-25 DIAGNOSIS — J449 Chronic obstructive pulmonary disease, unspecified: Secondary | ICD-10-CM | POA: Diagnosis not present

## 2022-04-04 ENCOUNTER — Other Ambulatory Visit: Payer: Self-pay

## 2022-04-04 ENCOUNTER — Emergency Department (HOSPITAL_BASED_OUTPATIENT_CLINIC_OR_DEPARTMENT_OTHER): Payer: Medicare Other

## 2022-04-04 ENCOUNTER — Inpatient Hospital Stay (HOSPITAL_BASED_OUTPATIENT_CLINIC_OR_DEPARTMENT_OTHER)
Admission: EM | Admit: 2022-04-04 | Discharge: 2022-04-11 | DRG: 388 | Disposition: A | Discharge status: Home Health | Payer: Medicare Other | Attending: Internal Medicine | Admitting: Internal Medicine

## 2022-04-04 ENCOUNTER — Encounter (HOSPITAL_BASED_OUTPATIENT_CLINIC_OR_DEPARTMENT_OTHER): Payer: Self-pay

## 2022-04-04 DIAGNOSIS — K219 Gastro-esophageal reflux disease without esophagitis: Secondary | ICD-10-CM | POA: Diagnosis present

## 2022-04-04 DIAGNOSIS — Z794 Long term (current) use of insulin: Secondary | ICD-10-CM | POA: Diagnosis not present

## 2022-04-04 DIAGNOSIS — I4891 Unspecified atrial fibrillation: Secondary | ICD-10-CM | POA: Diagnosis not present

## 2022-04-04 DIAGNOSIS — E1140 Type 2 diabetes mellitus with diabetic neuropathy, unspecified: Secondary | ICD-10-CM | POA: Diagnosis present

## 2022-04-04 DIAGNOSIS — R0602 Shortness of breath: Secondary | ICD-10-CM | POA: Diagnosis present

## 2022-04-04 DIAGNOSIS — F17210 Nicotine dependence, cigarettes, uncomplicated: Secondary | ICD-10-CM | POA: Diagnosis present

## 2022-04-04 DIAGNOSIS — E785 Hyperlipidemia, unspecified: Secondary | ICD-10-CM | POA: Diagnosis present

## 2022-04-04 DIAGNOSIS — R1013 Epigastric pain: Secondary | ICD-10-CM | POA: Diagnosis not present

## 2022-04-04 DIAGNOSIS — R0902 Hypoxemia: Secondary | ICD-10-CM | POA: Diagnosis not present

## 2022-04-04 DIAGNOSIS — M549 Dorsalgia, unspecified: Secondary | ICD-10-CM | POA: Diagnosis not present

## 2022-04-04 DIAGNOSIS — G8929 Other chronic pain: Secondary | ICD-10-CM | POA: Diagnosis not present

## 2022-04-04 DIAGNOSIS — K56609 Unspecified intestinal obstruction, unspecified as to partial versus complete obstruction: Secondary | ICD-10-CM | POA: Diagnosis not present

## 2022-04-04 DIAGNOSIS — Z825 Family history of asthma and other chronic lower respiratory diseases: Secondary | ICD-10-CM

## 2022-04-04 DIAGNOSIS — D649 Anemia, unspecified: Secondary | ICD-10-CM | POA: Diagnosis not present

## 2022-04-04 DIAGNOSIS — Z8249 Family history of ischemic heart disease and other diseases of the circulatory system: Secondary | ICD-10-CM

## 2022-04-04 DIAGNOSIS — Z886 Allergy status to analgesic agent status: Secondary | ICD-10-CM

## 2022-04-04 DIAGNOSIS — Z9884 Bariatric surgery status: Secondary | ICD-10-CM | POA: Diagnosis not present

## 2022-04-04 DIAGNOSIS — Z980 Intestinal bypass and anastomosis status: Secondary | ICD-10-CM

## 2022-04-04 DIAGNOSIS — R072 Precordial pain: Secondary | ICD-10-CM

## 2022-04-04 DIAGNOSIS — Z888 Allergy status to other drugs, medicaments and biological substances status: Secondary | ICD-10-CM

## 2022-04-04 DIAGNOSIS — R109 Unspecified abdominal pain: Secondary | ICD-10-CM | POA: Diagnosis not present

## 2022-04-04 DIAGNOSIS — F32A Depression, unspecified: Secondary | ICD-10-CM | POA: Diagnosis present

## 2022-04-04 DIAGNOSIS — F250 Schizoaffective disorder, bipolar type: Secondary | ICD-10-CM | POA: Diagnosis present

## 2022-04-04 DIAGNOSIS — K5901 Slow transit constipation: Secondary | ICD-10-CM | POA: Diagnosis present

## 2022-04-04 DIAGNOSIS — G40909 Epilepsy, unspecified, not intractable, without status epilepticus: Secondary | ICD-10-CM | POA: Diagnosis present

## 2022-04-04 DIAGNOSIS — K5900 Constipation, unspecified: Secondary | ICD-10-CM | POA: Diagnosis not present

## 2022-04-04 DIAGNOSIS — Z20822 Contact with and (suspected) exposure to covid-19: Secondary | ICD-10-CM | POA: Diagnosis not present

## 2022-04-04 DIAGNOSIS — E43 Unspecified severe protein-calorie malnutrition: Secondary | ICD-10-CM | POA: Diagnosis not present

## 2022-04-04 DIAGNOSIS — R079 Chest pain, unspecified: Secondary | ICD-10-CM | POA: Diagnosis not present

## 2022-04-04 DIAGNOSIS — Z79899 Other long term (current) drug therapy: Secondary | ICD-10-CM

## 2022-04-04 DIAGNOSIS — Z90710 Acquired absence of both cervix and uterus: Secondary | ICD-10-CM

## 2022-04-04 DIAGNOSIS — R0789 Other chest pain: Secondary | ICD-10-CM | POA: Diagnosis not present

## 2022-04-04 DIAGNOSIS — I10 Essential (primary) hypertension: Secondary | ICD-10-CM | POA: Diagnosis present

## 2022-04-04 DIAGNOSIS — E039 Hypothyroidism, unspecified: Secondary | ICD-10-CM | POA: Diagnosis not present

## 2022-04-04 DIAGNOSIS — Z6820 Body mass index (BMI) 20.0-20.9, adult: Secondary | ICD-10-CM

## 2022-04-04 DIAGNOSIS — Z833 Family history of diabetes mellitus: Secondary | ICD-10-CM

## 2022-04-04 DIAGNOSIS — F259 Schizoaffective disorder, unspecified: Secondary | ICD-10-CM | POA: Diagnosis present

## 2022-04-04 DIAGNOSIS — J449 Chronic obstructive pulmonary disease, unspecified: Secondary | ICD-10-CM | POA: Diagnosis not present

## 2022-04-04 DIAGNOSIS — Z9151 Personal history of suicidal behavior: Secondary | ICD-10-CM

## 2022-04-04 LAB — COMPREHENSIVE METABOLIC PANEL
ALT: 14 U/L (ref 0–44)
AST: 11 U/L — ABNORMAL LOW (ref 15–41)
Albumin: 4.3 g/dL (ref 3.5–5.0)
Alkaline Phosphatase: 142 U/L — ABNORMAL HIGH (ref 38–126)
Anion gap: 12 (ref 5–15)
BUN: 9 mg/dL (ref 6–20)
CO2: 28 mmol/L (ref 22–32)
Calcium: 9.9 mg/dL (ref 8.9–10.3)
Chloride: 94 mmol/L — ABNORMAL LOW (ref 98–111)
Creatinine, Ser: 0.53 mg/dL (ref 0.44–1.00)
GFR, Estimated: >60 mL/min (ref >60)
Glucose, Bld: 216 mg/dL — ABNORMAL HIGH (ref 70–99)
Potassium: 4.4 mmol/L (ref 3.5–5.1)
Sodium: 134 mmol/L — ABNORMAL LOW (ref 135–145)
Total Bilirubin: 0.4 mg/dL (ref 0.3–1.2)
Total Protein: 8.6 g/dL — ABNORMAL HIGH (ref 6.5–8.1)

## 2022-04-04 LAB — URINALYSIS, ROUTINE W REFLEX MICROSCOPIC
Bilirubin Urine: NEGATIVE
Glucose, UA: NEGATIVE mg/dL
Hgb urine dipstick: NEGATIVE
Ketones, ur: NEGATIVE mg/dL
Leukocytes,Ua: NEGATIVE
Nitrite: NEGATIVE
Protein, ur: 30 mg/dL — AB
Specific Gravity, Urine: 1.032 — ABNORMAL HIGH (ref 1.005–1.030)
pH: 5.5 (ref 5.0–8.0)

## 2022-04-04 LAB — CBC
HCT: 44.6 % (ref 36.0–46.0)
Hemoglobin: 14.7 g/dL (ref 12.0–15.0)
MCH: 28.9 pg (ref 26.0–34.0)
MCHC: 33 g/dL (ref 30.0–36.0)
MCV: 87.6 fL (ref 80.0–100.0)
Platelets: 294 10*3/uL (ref 150–400)
RBC: 5.09 MIL/uL (ref 3.87–5.11)
RDW: 15.2 % (ref 11.5–15.5)
WBC: 8.9 10*3/uL (ref 4.0–10.5)
nRBC: 0 % (ref 0.0–0.2)

## 2022-04-04 LAB — LIPASE, BLOOD: Lipase: 31 U/L (ref 11–51)

## 2022-04-04 MED ORDER — SODIUM CHLORIDE 0.9 % IV BOLUS
1000.0000 mL | Freq: Once | INTRAVENOUS | Status: AC
  Administered 2022-04-04: 1000 mL via INTRAVENOUS

## 2022-04-04 MED ORDER — ONDANSETRON HCL 4 MG/2ML IJ SOLN
4.0000 mg | Freq: Once | INTRAMUSCULAR | Status: AC
  Administered 2022-04-04: 4 mg via INTRAVENOUS
  Filled 2022-04-04: qty 2

## 2022-04-04 MED ORDER — ONDANSETRON HCL 4 MG/2ML IJ SOLN
4.0000 mg | Freq: Once | INTRAMUSCULAR | Status: AC | PRN
  Administered 2022-04-04: 4 mg via INTRAVENOUS
  Filled 2022-04-04: qty 2

## 2022-04-04 MED ORDER — IOHEXOL 350 MG/ML SOLN
100.0000 mL | Freq: Once | INTRAVENOUS | Status: AC | PRN
  Administered 2022-04-04: 60 mL via INTRAVENOUS

## 2022-04-04 MED ORDER — FENTANYL CITRATE PF 50 MCG/ML IJ SOSY
100.0000 ug | PREFILLED_SYRINGE | Freq: Once | INTRAMUSCULAR | Status: AC
  Administered 2022-04-04: 100 ug via INTRAVENOUS
  Filled 2022-04-04: qty 2

## 2022-04-04 NOTE — ED Notes (Signed)
Patient transported to CT via stretcher at this time - pt remains on 2L O2 via [place] - escorted by [employer]

## 2022-04-04 NOTE — ED Notes (Signed)
RT assessed pt for desaturation to mid 80's on RA. RT changed out pulse ox, pts sats now 93% on RA. Pts daughter and medical POA states pt has never seen pulmonologist or had PFT for proper diagnosis of her COPD. Daughter requesting information on Pulmonologist and test needed. RT will continue to monitor while at [facility] ED.

## 2022-04-04 NOTE — ED Notes (Signed)
RT placed pt on [place] 2 Lpm for desaturation into the upper 70's. Pt post oxygen placement with sats of 95%. Pt respiratory status stable w/no distress noted at this time. RT will continue to monitor while at [facility] ED.

## 2022-04-04 NOTE — ED Triage Notes (Signed)
Patient here POV from Home.  Endorses ABD Pain to Mid ABD that began today after eating at a [employer] which makes Patient believe she has Food Poisoning. Associated with N/V.  No Known Fevers or Diarrhea.   NAD Noted during Triage. A&Ox4. Gcs 15. Ambulatory.

## 2022-04-05 ENCOUNTER — Inpatient Hospital Stay (HOSPITAL_COMMUNITY): Payer: Medicare Other

## 2022-04-05 ENCOUNTER — Emergency Department (HOSPITAL_BASED_OUTPATIENT_CLINIC_OR_DEPARTMENT_OTHER): Payer: Medicare Other

## 2022-04-05 ENCOUNTER — Emergency Department (HOSPITAL_COMMUNITY): Payer: Medicare Other

## 2022-04-05 DIAGNOSIS — K449 Diaphragmatic hernia without obstruction or gangrene: Secondary | ICD-10-CM | POA: Diagnosis not present

## 2022-04-05 DIAGNOSIS — K5900 Constipation, unspecified: Secondary | ICD-10-CM | POA: Diagnosis not present

## 2022-04-05 DIAGNOSIS — M549 Dorsalgia, unspecified: Secondary | ICD-10-CM | POA: Diagnosis present

## 2022-04-05 DIAGNOSIS — I700 Atherosclerosis of aorta: Secondary | ICD-10-CM | POA: Diagnosis not present

## 2022-04-05 DIAGNOSIS — K6389 Other specified diseases of intestine: Secondary | ICD-10-CM | POA: Diagnosis not present

## 2022-04-05 DIAGNOSIS — K219 Gastro-esophageal reflux disease without esophagitis: Secondary | ICD-10-CM | POA: Diagnosis not present

## 2022-04-05 DIAGNOSIS — F17210 Nicotine dependence, cigarettes, uncomplicated: Secondary | ICD-10-CM | POA: Diagnosis present

## 2022-04-05 DIAGNOSIS — R1084 Generalized abdominal pain: Secondary | ICD-10-CM | POA: Diagnosis not present

## 2022-04-05 DIAGNOSIS — R1110 Vomiting, unspecified: Secondary | ICD-10-CM | POA: Diagnosis not present

## 2022-04-05 DIAGNOSIS — Z825 Family history of asthma and other chronic lower respiratory diseases: Secondary | ICD-10-CM | POA: Diagnosis not present

## 2022-04-05 DIAGNOSIS — Z20822 Contact with and (suspected) exposure to covid-19: Secondary | ICD-10-CM | POA: Diagnosis present

## 2022-04-05 DIAGNOSIS — R0602 Shortness of breath: Secondary | ICD-10-CM | POA: Diagnosis not present

## 2022-04-05 DIAGNOSIS — K56609 Unspecified intestinal obstruction, unspecified as to partial versus complete obstruction: Secondary | ICD-10-CM | POA: Diagnosis not present

## 2022-04-05 DIAGNOSIS — F32A Depression, unspecified: Secondary | ICD-10-CM | POA: Diagnosis present

## 2022-04-05 DIAGNOSIS — R0902 Hypoxemia: Secondary | ICD-10-CM | POA: Diagnosis present

## 2022-04-05 DIAGNOSIS — G40909 Epilepsy, unspecified, not intractable, without status epilepticus: Secondary | ICD-10-CM | POA: Diagnosis present

## 2022-04-05 DIAGNOSIS — E039 Hypothyroidism, unspecified: Secondary | ICD-10-CM | POA: Diagnosis present

## 2022-04-05 DIAGNOSIS — Z8249 Family history of ischemic heart disease and other diseases of the circulatory system: Secondary | ICD-10-CM | POA: Diagnosis not present

## 2022-04-05 DIAGNOSIS — R109 Unspecified abdominal pain: Secondary | ICD-10-CM | POA: Diagnosis not present

## 2022-04-05 DIAGNOSIS — E1140 Type 2 diabetes mellitus with diabetic neuropathy, unspecified: Secondary | ICD-10-CM | POA: Diagnosis present

## 2022-04-05 DIAGNOSIS — E43 Unspecified severe protein-calorie malnutrition: Secondary | ICD-10-CM | POA: Diagnosis not present

## 2022-04-05 DIAGNOSIS — E785 Hyperlipidemia, unspecified: Secondary | ICD-10-CM | POA: Diagnosis present

## 2022-04-05 DIAGNOSIS — G8929 Other chronic pain: Secondary | ICD-10-CM | POA: Diagnosis present

## 2022-04-05 DIAGNOSIS — D649 Anemia, unspecified: Secondary | ICD-10-CM | POA: Diagnosis present

## 2022-04-05 DIAGNOSIS — Z4682 Encounter for fitting and adjustment of non-vascular catheter: Secondary | ICD-10-CM | POA: Diagnosis not present

## 2022-04-05 DIAGNOSIS — K5939 Other megacolon: Secondary | ICD-10-CM | POA: Diagnosis not present

## 2022-04-05 DIAGNOSIS — F250 Schizoaffective disorder, bipolar type: Secondary | ICD-10-CM | POA: Diagnosis present

## 2022-04-05 DIAGNOSIS — Z794 Long term (current) use of insulin: Secondary | ICD-10-CM | POA: Diagnosis not present

## 2022-04-05 DIAGNOSIS — I10 Essential (primary) hypertension: Secondary | ICD-10-CM | POA: Diagnosis not present

## 2022-04-05 DIAGNOSIS — I4891 Unspecified atrial fibrillation: Secondary | ICD-10-CM | POA: Diagnosis present

## 2022-04-05 DIAGNOSIS — K5669 Other partial intestinal obstruction: Secondary | ICD-10-CM | POA: Diagnosis not present

## 2022-04-05 DIAGNOSIS — R072 Precordial pain: Secondary | ICD-10-CM | POA: Diagnosis present

## 2022-04-05 DIAGNOSIS — Z9884 Bariatric surgery status: Secondary | ICD-10-CM | POA: Diagnosis not present

## 2022-04-05 DIAGNOSIS — J449 Chronic obstructive pulmonary disease, unspecified: Secondary | ICD-10-CM | POA: Diagnosis present

## 2022-04-05 LAB — TROPONIN I (HIGH SENSITIVITY)
Troponin I (High Sensitivity): <2 ng/L (ref <18)
Troponin I (High Sensitivity): 2 ng/L (ref <18)

## 2022-04-05 LAB — CBC WITH DIFFERENTIAL/PLATELET
Abs Immature Granulocytes: 0.02 10*3/uL (ref 0.00–0.07)
Basophils Absolute: 0 10*3/uL (ref 0.0–0.1)
Basophils Relative: 0 %
Eosinophils Absolute: 0 10*3/uL (ref 0.0–0.5)
Eosinophils Relative: 0 %
HCT: 44.6 % (ref 36.0–46.0)
Hemoglobin: 14.1 g/dL (ref 12.0–15.0)
Immature Granulocytes: 0 %
Lymphocytes Relative: 14 %
Lymphs Abs: 1.1 10*3/uL (ref 0.7–4.0)
MCH: 29.2 pg (ref 26.0–34.0)
MCHC: 31.6 g/dL (ref 30.0–36.0)
MCV: 92.3 fL (ref 80.0–100.0)
Monocytes Absolute: 0.5 10*3/uL (ref 0.1–1.0)
Monocytes Relative: 7 %
Neutro Abs: 6.1 10*3/uL (ref 1.7–7.7)
Neutrophils Relative %: 79 %
Platelets: 256 10*3/uL (ref 150–400)
RBC: 4.83 MIL/uL (ref 3.87–5.11)
RDW: 15.2 % (ref 11.5–15.5)
WBC: 7.8 10*3/uL (ref 4.0–10.5)
nRBC: 0 % (ref 0.0–0.2)

## 2022-04-05 LAB — COMPREHENSIVE METABOLIC PANEL
ALT: 16 U/L (ref 0–44)
AST: 14 U/L — ABNORMAL LOW (ref 15–41)
Albumin: 3.3 g/dL — ABNORMAL LOW (ref 3.5–5.0)
Alkaline Phosphatase: 128 U/L — ABNORMAL HIGH (ref 38–126)
Anion gap: 8 (ref 5–15)
BUN: <5 mg/dL — ABNORMAL LOW (ref 6–20)
CO2: 26 mmol/L (ref 22–32)
Calcium: 8.6 mg/dL — ABNORMAL LOW (ref 8.9–10.3)
Chloride: 98 mmol/L (ref 98–111)
Creatinine, Ser: 0.39 mg/dL — ABNORMAL LOW (ref 0.44–1.00)
GFR, Estimated: >60 mL/min (ref >60)
Glucose, Bld: 167 mg/dL — ABNORMAL HIGH (ref 70–99)
Potassium: 4 mmol/L (ref 3.5–5.1)
Sodium: 132 mmol/L — ABNORMAL LOW (ref 135–145)
Total Bilirubin: 0.2 mg/dL — ABNORMAL LOW (ref 0.3–1.2)
Total Protein: 7 g/dL (ref 6.5–8.1)

## 2022-04-05 LAB — GLUCOSE, CAPILLARY
Glucose-Capillary: 157 mg/dL — ABNORMAL HIGH (ref 70–99)
Glucose-Capillary: 93 mg/dL (ref 70–99)

## 2022-04-05 LAB — CBG MONITORING, ED
Glucose-Capillary: 160 mg/dL — ABNORMAL HIGH (ref 70–99)
Glucose-Capillary: 124 mg/dL — ABNORMAL HIGH (ref 70–99)
Glucose-Capillary: 187 mg/dL — ABNORMAL HIGH (ref 70–99)

## 2022-04-05 LAB — HEMOGLOBIN A1C
Hgb A1c MFr Bld: 6.5 % — ABNORMAL HIGH (ref 4.8–5.6)
Mean Plasma Glucose: 139.85 mg/dL

## 2022-04-05 LAB — RESP PANEL BY RT-PCR (FLU A&B, COVID) ARPGX2
Influenza A by PCR: NEGATIVE
Influenza B by PCR: NEGATIVE
SARS Coronavirus 2 by RT PCR: NEGATIVE

## 2022-04-05 LAB — TSH: TSH: 0.544 u[IU]/mL (ref 0.350–4.500)

## 2022-04-05 LAB — LACTIC ACID, PLASMA: Lactic Acid, Venous: 0.8 mmol/L (ref 0.5–1.9)

## 2022-04-05 MED ORDER — PROCHLORPERAZINE EDISYLATE 10 MG/2ML IJ SOLN
5.0000 mg | Freq: Four times a day (QID) | INTRAMUSCULAR | Status: DC | PRN
  Administered 2022-04-05 – 2022-04-06 (×2): 5 mg via INTRAVENOUS
  Filled 2022-04-05 (×2): qty 1
  Filled 2022-04-05: qty 2

## 2022-04-05 MED ORDER — FENTANYL CITRATE PF 50 MCG/ML IJ SOSY
12.5000 ug | PREFILLED_SYRINGE | INTRAMUSCULAR | Status: DC | PRN
  Administered 2022-04-05 – 2022-04-06 (×8): 50 ug via INTRAVENOUS
  Administered 2022-04-07: 12.5 ug via INTRAVENOUS
  Administered 2022-04-07 – 2022-04-08 (×2): 25 ug via INTRAVENOUS
  Filled 2022-04-05 (×12): qty 1

## 2022-04-05 MED ORDER — HEPARIN SODIUM (PORCINE) 5000 UNIT/ML IJ SOLN
5000.0000 [IU] | Freq: Three times a day (TID) | INTRAMUSCULAR | Status: DC
  Administered 2022-04-05 – 2022-04-11 (×19): 5000 [IU] via SUBCUTANEOUS
  Filled 2022-04-05 (×18): qty 1

## 2022-04-05 MED ORDER — LACTATED RINGERS IV SOLN: INTRAVENOUS | Status: AC

## 2022-04-05 MED ORDER — ONDANSETRON HCL 4 MG PO TABS: 4.0000 mg | ORAL_TABLET | Freq: Four times a day (QID) | ORAL | Status: DC | PRN

## 2022-04-05 MED ORDER — ONDANSETRON HCL 4 MG/2ML IJ SOLN
4.0000 mg | Freq: Four times a day (QID) | INTRAMUSCULAR | Status: DC | PRN
  Administered 2022-04-05 – 2022-04-08 (×5): 4 mg via INTRAVENOUS
  Filled 2022-04-05 (×4): qty 2

## 2022-04-05 MED ORDER — INSULIN ASPART 100 UNIT/ML IJ SOLN
0.0000 [IU] | INTRAMUSCULAR | Status: DC
  Administered 2022-04-05 – 2022-04-09 (×6): 1 [IU] via SUBCUTANEOUS
  Administered 2022-04-09: 2 [IU] via SUBCUTANEOUS

## 2022-04-05 MED ORDER — DIATRIZOATE MEGLUMINE & SODIUM 66-10 % PO SOLN
90.0000 mL | Freq: Once | ORAL | Status: AC
  Administered 2022-04-05: 90 mL via NASOGASTRIC
  Filled 2022-04-05: qty 90

## 2022-04-05 MED ORDER — ONDANSETRON HCL 4 MG/2ML IJ SOLN: 4.0000 mg | Freq: Once | INTRAMUSCULAR | Status: DC

## 2022-04-05 MED ORDER — LACOSAMIDE 50 MG PO TABS
50.0000 mg | ORAL_TABLET | Freq: Two times a day (BID) | ORAL | Status: DC
  Administered 2022-04-05: 50 mg via ORAL
  Filled 2022-04-05 (×2): qty 1

## 2022-04-05 MED ORDER — LORAZEPAM 2 MG/ML IJ SOLN
1.0000 mg | Freq: Once | INTRAMUSCULAR | Status: AC
  Administered 2022-04-05: 1 mg via INTRAVENOUS
  Filled 2022-04-05: qty 1

## 2022-04-05 NOTE — ED Notes (Signed)
Admitting provider at bedside.

## 2022-04-05 NOTE — ED Notes (Signed)
No NG Tube per surgery Dr. Sammuel Hines. At bedside.

## 2022-04-05 NOTE — ED Notes (Signed)
NG tube pulled out another 10cm per MD order. Some difficulty when flushing, but still able to flush

## 2022-04-05 NOTE — ED Notes (Signed)
Surgery is aware of successful NG tube placement

## 2022-04-05 NOTE — ED Notes (Signed)
Pt NGT was clamped for gastrografin. Clamped for an hour. Pt low intermittent suction restarted with 200cc bile colored output this time. Will notify MD.

## 2022-04-05 NOTE — ED Notes (Signed)
Pt is having an NG tube put in the reason for O2 stat for being so low.

## 2022-04-05 NOTE — ED Notes (Signed)
Total NGT output since NGT placement this AM is 850cc. Bile colored liquid drainage noted. Will continue to monitor.

## 2022-04-05 NOTE — ED Notes (Signed)
Assuming care for pt. Pt currently sleeping. NV have resolved at this time. Updated no need NG tube per admitting provider. Daughter at bedside. Call bell within reach

## 2022-04-05 NOTE — ED Notes (Signed)
Pt NG tube pulled back 11cm per MD request. Secured in place and marked on tube.

## 2022-04-05 NOTE — ED Notes (Signed)
Pt started vomiting at this time. Zofran IVP given. Will continue to monitor.

## 2022-04-05 NOTE — H&P (Signed)
History and Physical    [name] LNL:[id number] DOB: [birth year] DOA: 04/04/2022  PCP: [name], MD  Patient coming from: home  I have personally briefly reviewed patient's old medical records in [place]  Chief Complaint: abd pain with n/v that started after eating  HPI: [name] is a 56 y.o. female with medical history  significant of  Schizo affective d/o , COPD, Anemia, GERD, HTN, gastric bypass in 2017 by Dr. Hillary Bow recent interim history of  seizure s/p fall diagnosed 8/23 for which she was started on vimpat. In addition to history of  admission 7/23-8/23 for suicide attempt complicated by aspiration pneumonia with acute respiratory failure that was vent dependent. Patient late has trach placed. During this admission patient also developed colonic ileus and was seen by GI and placed on Linzess and lactulose. Patient now presents to ED with complaints of abdominal pain with associated n/v s/p eating at funeral. Patient on ros notes last  BM was yesterday.She notes  bm since onset of abdominal pain. She denies chest pain, HA, dysuria , sob, or black stools or blood in stools.    ED Course:  Patient on evaluation found to have SBO with transition zone noted within the posterior aspect of pelvis. Surgery was consulted who noted no surgical intervention required at this time and recommended  a trial of conservative management with plans for OR if her symptoms worsen.   Temp 97.1, bp 164/115, hr 94, rr 18 sat 94%  Labs:  Wbc 8.9, hb 14.7, plt294 Ua neg Na 134, gly 216, alphos 142 cr 0.53 Cxr: NAD CTPA:. No evidence of pulmonary embolism or other acute intrathoracic process. 2. Chronic compression fracture deformity at the level of T9 CT AB . Small-bowel obstruction with a transition zone noted within the posterior aspect of the pelvis. 2. Mild twisting of the mesentery within the right lower quadrant and right hemipelvis which may represent sequelae  associated with an internal hernia. 3. 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 4. Small hiatal hernia. 5. Aortic atherosclerosis.  EKG:NSR no hyperacute findings Review of Systems: As per HPI otherwise 10 point review of systems negative.   Past Medical History:  Diagnosis Date   Abdominal pain    Accidental drug overdose April 2013   Anxiety    Atrial fibrillation ([place]) 09/29/11   converted spontaneously   Chronic back pain    Chronic knee pain    Chronic nausea    Chronic pain    COPD (chronic obstructive pulmonary disease) ([place])    Depression    Diabetes mellitus    states her doctor took her off all DM meds in past month   Diabetic neuropathy ([place])    Dyspnea    with exertion    GERD (gastroesophageal reflux disease)    Headache(784.0)    migraines    HTN (hypertension)    not on meds since in a year    Hyperlipidemia    Hypothyroidism    not on meds in a while    Mental disorder    Bipolar and schizophrenic   Requires supplemental oxygen    as needed per patient    Schizophrenia ([place])    Schizophrenia, acute ([place]) 11/13/2017   Tobacco abuse     Past Surgical History:  Procedure Laterality Date   ABDOMINAL HYSTERECTOMY     BLADDER SUSPENSION  03/04/2011   Procedure: [facility] - Eat PROCEDURE;  Surgeon: Elayne Snare MacDiarmid;  Location: [facility];  Service:  Urology;  Laterality: N/A;   CYSTOCELE REPAIR  03/04/2011   Procedure: ANTERIOR REPAIR (CYSTOCELE);  Surgeon: Reece Packer;  Location: [facility];  Service: Urology;  Laterality: N/A;   CYSTOSCOPY  03/04/2011   Procedure: CYSTOSCOPY;  Surgeon: Elayne Snare MacDiarmid;  Location: [facility];  Service: Urology;  Laterality: N/A;   ESOPHAGOGASTRODUODENOSCOPY (EGD) WITH PROPOFOL N/A 05/12/2017   Procedure: ESOPHAGOGASTRODUODENOSCOPY (EGD) WITH PROPOFOL;  Surgeon: Alphonsa Overall, MD;  Location: [facility];  Service: General;  Laterality: N/A;   GASTRIC ROUX-EN-Y N/A 03/25/2016   Procedure: LAPAROSCOPIC  ROUX-EN-Y GASTRIC BYPASS WITH UPPER ENDOSCOPY;  Surgeon: Excell Seltzer, MD;  Location: [facility];  Service: General;  Laterality: N/A;   KNEE SURGERY     LAPAROSCOPIC ASSISTED VAGINAL HYSTERECTOMY  03/04/2011   Procedure: LAPAROSCOPIC ASSISTED VAGINAL HYSTERECTOMY;  Surgeon: Cyril Mourning, MD;  [facility];  Service: Gynecology;  Laterality: N/A;     reports that she has been smoking cigarettes. She has been smoking an average of .5 packs per day. She has never used smokeless tobacco. She reports current alcohol use. She reports current drug use. Drugs: Cocaine, Marijuana, and "Crack" cocaine.  Allergies  Allergen Reactions   Iron Dextran Shortness Of Breath and Anxiety   Aspirin Nausea And Vomiting and Other (See Comments)    Ok to take tylenol or ibuprofen     Family History  Problem Relation Age of Onset   Heart attack Father        79s   Diabetes Mother    Heart disease Mother    Hypertension Mother    Heart attack Sister        23   COPD Other    Breast cancer Neg Hx     Prior to Admission medications   Medication Sig Start Date End Date Taking? Authorizing Provider  albuterol (PROAIR HFA) 108 (90 Base) MCG/ACT inhaler Inhale 2 puffs into the lungs every 6 (six) hours as needed for wheezing or shortness of breath. 11/16/17  Yes Nwoko, Herbert Pun I, NP  amLODipine (NORVASC) 5 MG tablet Take 1 tablet (5 mg total) by mouth daily. 02/12/22 04/06/23 Yes Elodia Florence., MD  ascorbic acid (VITAMIN C) 500 MG tablet Take 1 tablet (500 mg total) by mouth daily. 02/12/22  Yes Elodia Florence., MD  Cholecalciferol 25 MCG (1000 UT) capsule Take 1 capsule (1,000 Units total) by mouth daily. 08/03/20  Yes Florencia Reasons, MD  folic acid (FOLVITE) 1 MG tablet Take 1 tablet (1 mg total) by mouth daily. 02/12/22  Yes Elodia Florence., MD  ibuprofen (ADVIL) 800 MG tablet Take 800 mg by mouth 3 (three) times daily as needed for pain. 12/31/21  Yes [provider]  lactulose  (CHRONULAC) 10 GM/15ML solution Take 45 mLs (30 g total) by mouth 2 (two) times daily. 02/12/22  Yes Elodia Florence., MD  linaclotide [facility]) 145 MCG CAPS capsule Take 1 capsule (145 mcg total) by mouth daily before breakfast. 02/13/22  Yes Elodia Florence., MD  Multiple Vitamin (MULTIVITAMIN WITH MINERALS) TABS tablet Take 1 tablet by mouth 2 (two) times daily. Patient taking differently: Take 1 tablet by mouth daily. 07/27/20  Yes Florencia Reasons, MD  pantoprazole (PROTONIX) 40 MG tablet Take 1 tablet (40 mg total) by mouth daily. 11/16/21  Yes Thurnell Lose, MD  PARoxetine (PAXIL) 20 MG tablet Take 20 mg by mouth every morning. 03/14/22  Yes [provider]  QUEtiapine (SEROQUEL) 400 MG tablet  Take 400 mg by mouth at bedtime. 03/14/22  Yes [provider]  SUMAtriptan 6 MG/0.5ML SOAJ Inject 6 mg into the muscle daily as needed for migraine. 11/01/21  Yes [provider]  ARIPiprazole (ABILIFY) 5 MG tablet Take 1 tablet (5 mg total) by mouth 2 (two) times daily. 02/12/22 04/06/23  Elodia Florence., MD  gabapentin (NEURONTIN) 300 MG capsule Take 1 capsule (300 mg total) by mouth daily AND 2 capsules (600 mg total) at bedtime. 02/12/22 04/06/23  Elodia Florence., MD  insulin aspart (NOVOLOG) 100 UNIT/ML injection Inject 0-15 Units into the skin 3 (three) times daily with meals. CBG < 70: treat hypoglycemia   CBG 70 - 120: 0 units  CBG 121 - 150: 2 units  CBG 151 - 200: 3 units  CBG 201 - 250: 5 units  CBG 251 - 300: 8 units  CBG 301 - 350: 11 units  CBG 351 - 400: 15 units  CBG > 400: call MD Patient not taking: Reported on 04/05/2022 02/12/22   Elodia Florence., MD  ipratropium-albuterol (DUONEB) 0.5-2.5 (3) MG/3ML SOLN Take 3 mLs by nebulization every 6 (six) hours as needed. Patient not taking: Reported on 04/05/2022 02/12/22   Elodia Florence., MD  LORazepam (ATIVAN) 2 MG/ML injection Inject 0.5 mLs (1 mg total) into the vein 2 (two) times  daily. 02/12/22   Elodia Florence., MD  metFORMIN (GLUCOPHAGE) 500 MG tablet Take 1 tablet (500 mg total) by mouth 2 (two) times daily with a meal. Patient not taking: Reported on 04/05/2022 11/16/21 12/16/21  Thurnell Lose, MD  metoprolol tartrate (LOPRESSOR) 50 MG tablet Take 1 tablet (50 mg total) by mouth 2 (two) times daily. 02/12/22 04/06/23  Elodia Florence., MD  nicotine (NICODERM CQ - DOSED IN MG/24 HOURS) 21 mg/24hr patch Place 1 patch (21 mg total) onto the skin daily. (May purchase from over the counter): For nicotine withdrawal symptoms Patient not taking: Reported on 04/05/2022 11/16/21   Thurnell Lose, MD  risperiDONE (RISPERDAL M-TABS) 0.5 MG disintegrating tablet Take 1 tablet (0.5 mg total) by mouth 2 (two) times daily as needed (agitation, insomnia,a ggression). Patient not taking: Reported on 04/05/2022 02/12/22   Elodia Florence., MD  SYMBICORT 160-4.5 MCG/ACT inhaler Inhale 2 puffs into the lungs daily as needed (sob and wheezing). Patient not taking: Reported on 11/13/2021 11/16/17   Lindell Spar I, NP  vitamin A 3 MG (10000 UNITS) capsule Take 1 capsule (10,000 Units total) by mouth daily. Patient not taking: Reported on 04/05/2022 02/12/22 04/13/22  Elodia Florence., MD    Physical Exam: Vitals:   04/05/22 0345 04/05/22 0400 04/05/22 0445 04/05/22 0500  BP: 123/76 129/80  (!) 140/79  Pulse: (!) 109 (!) 108  (!) 114  Resp: (!) 24 (!) 21 (!) 25 12  Temp:      TempSrc:      SpO2: 95% 94%  92%  Weight:      Height:        Constitutional: NAD, calm, uncomfortable Vitals:   04/05/22 0345 04/05/22 0400 04/05/22 0445 04/05/22 0500  BP: 123/76 129/80  (!) 140/79  Pulse: (!) 109 (!) 108  (!) 114  Resp: (!) 24 (!) 21 (!) 25 12  Temp:      TempSrc:      SpO2: 95% 94%  92%  Weight:      Height:       Eyes: PERRL,  lids and conjunctivae normal ENMT: Mucous membranes are moist. Posterior pharynx clear of any exudate or lesions.Normal dentition.   Neck: normal, supple, no masses, no thyromegaly Respiratory: clear to auscultation bilaterally, no wheezing, no crackles. Normal respiratory effort. No accessory muscle use.  Cardiovascular: Regular rate and rhythm, no murmurs / rubs / gallops. No extremity edema. 2+ pedal pulses. No carotid bruits.  Abdomen: +tenderness diffuse L>R, no masses palpated. No hepatosplenomegaly. Bowel sounds positive. distended Musculoskeletal: no clubbing / cyanosis. No joint deformity upper and lower extremities. Good ROM, no contractures. Normal muscle tone.  Skin: no rashes, lesions, ulcers. No induration Neurologic: CN 2-12 grossly intact. Sensation intact,  Strength 5/5 in all 4.  Psychiatric: Normal judgment and insight. Alert and oriented x 3. Normal mood.    Labs on Admission: I have personally reviewed following labs and imaging studies  CBC: Recent Labs  Lab 04/04/22 2201  WBC 8.9  HGB 14.7  HCT 44.6  MCV 87.6  PLT 578   Basic Metabolic Panel: Recent Labs  Lab 04/04/22 2201  NA 134*  K 4.4  CL 94*  CO2 28  GLUCOSE 216*  BUN 9  CREATININE 0.53  CALCIUM 9.9   GFR: Estimated Creatinine Clearance: 55.7 mL/min (by C-G formula based on SCr of 0.53 mg/dL). Liver Function Tests: Recent Labs  Lab 04/04/22 2201  AST 11*  ALT 14  ALKPHOS 142*  BILITOT 0.4  PROT 8.6*  ALBUMIN 4.3   Recent Labs  Lab 04/04/22 2201  LIPASE 31   No results for input(s): "AMMONIA" in the last 168 hours. Coagulation Profile: No results for input(s): "INR", "PROTIME" in the last 168 hours. Cardiac Enzymes: No results for input(s): "CKTOTAL", "CKMB", "CKMBINDEX", "TROPONINI" in the last 168 hours. BNP (last 3 results) No results for input(s): "PROBNP" in the last 8760 hours. HbA1C: No results for input(s): "HGBA1C" in the last 72 hours. CBG: No results for input(s): "GLUCAP" in the last 168 hours. Lipid Profile: No results for input(s): "CHOL", "HDL", "LDLCALC", "TRIG", "CHOLHDL", "LDLDIRECT"  in the last 72 hours. Thyroid Function Tests: No results for input(s): "TSH", "T4TOTAL", "FREET4", "T3FREE", "THYROIDAB" in the last 72 hours. Anemia Panel: No results for input(s): "VITAMINB12", "FOLATE", "FERRITIN", "TIBC", "IRON", "RETICCTPCT" in the last 72 hours. Urine analysis:    Component Value Date/Time   COLORURINE YELLOW 04/04/2022 2201   APPEARANCEUR CLEAR 04/04/2022 2201   LABSPEC 1.032 (H) 04/04/2022 2201   PHURINE 5.5 04/04/2022 2201   GLUCOSEU NEGATIVE 04/04/2022 2201   HGBUR NEGATIVE 04/04/2022 2201   BILIRUBINUR NEGATIVE 04/04/2022 2201   [place] 04/04/2022 2201   PROTEINUR 30 (A) 04/04/2022 2201   UROBILINOGEN 0.2 09/04/2013 0426   NITRITE NEGATIVE 04/04/2022 2201   LEUKOCYTESUR NEGATIVE 04/04/2022 2201    Radiological Exams on Admission: CT ABDOMEN PELVIS W CONTRAST  Result Date: 04/05/2022 CLINICAL DATA:  Abdominal pain with nausea and vomiting. EXAM: CT ABDOMEN AND PELVIS WITH CONTRAST TECHNIQUE: Multidetector CT imaging of the abdomen and pelvis was performed using the standard protocol following bolus administration of intravenous contrast. RADIATION DOSE REDUCTION: This exam was performed according to the departmental dose-optimization program which includes automated exposure control, adjustment of the mA and/or kV according to patient size and/or use of iterative reconstruction technique. CONTRAST:  64m OMNIPAQUE IOHEXOL 350 MG/ML SOLN COMPARISON:  None Available. FINDINGS: Lower chest: No acute abnormality. Hepatobiliary: No focal liver abnormality is seen. No gallstones, gallbladder wall thickening, or biliary dilatation. Pancreas: Unremarkable. No pancreatic ductal dilatation or surrounding inflammatory changes. Spleen:  Normal in size without focal abnormality. Adrenals/Urinary Tract: Adrenal glands are unremarkable. Kidneys are normal, without renal calculi, focal lesion, or hydronephrosis. Bladder is unremarkable. Stomach/Bowel: There is a small  hiatal hernia with surgical sutures noted throughout the gastric region. Surgically anastomosed bowel is also seen within the anteromedial aspect of the mid to lower left abdomen. The appendix is not clearly identified. Multiple dilated small bowel loops are seen within the left lower quadrant and left hemipelvis (maximum small bowel diameter of approximately 3.4 cm). A transition zone is noted within the posterior aspect of the pelvis (axial CT images 64 through 71, CT series 2). Mild twisting of the mesentery is suspected within the right lower quadrant and right hemipelvis (axial CT images 48 through 58, CT series 2). Vascular/Lymphatic: Aortic atherosclerosis. No enlarged abdominal or pelvic lymph nodes. Reproductive: Status post hysterectomy. No adnexal masses. Other: No abdominal wall hernia or abnormality. No abdominopelvic ascites. Musculoskeletal: No acute or significant osseous findings. IMPRESSION: 1. Small-bowel obstruction with a transition zone noted within the posterior aspect of the pelvis. 2. Mild twisting of the mesentery within the right lower quadrant and right hemipelvis which may represent sequelae associated with an internal hernia. 3. 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 4. Small hiatal hernia. 5. Aortic atherosclerosis. Aortic Atherosclerosis (ICD10-I70.0). Electronically Signed   By: Virgina Norfolk M.D.   On: 04/05/2022 00:17   CT Angio Chest PE W and/or Wo Contrast  Result Date: 04/05/2022 CLINICAL DATA:  Decreasing oxygen saturation. EXAM: CT ANGIOGRAPHY CHEST WITH CONTRAST TECHNIQUE: Multidetector CT imaging of the chest was performed using the standard protocol during bolus administration of intravenous contrast. Multiplanar CT image reconstructions and MIPs were obtained to evaluate the vascular anatomy. RADIATION DOSE REDUCTION: This exam was performed according to the departmental dose-optimization program which includes automated exposure  control, adjustment of the mA and/or kV according to patient size and/or use of iterative reconstruction technique. CONTRAST:  80m OMNIPAQUE IOHEXOL 350 MG/ML SOLN COMPARISON:  Nov 14, 2021 FINDINGS: Cardiovascular: Satisfactory opacification of the pulmonary arteries to the segmental level. No evidence of pulmonary embolism. Normal heart size. No pericardial effusion. Mediastinum/Nodes: No enlarged mediastinal, hilar, or axillary lymph nodes. Thyroid gland, trachea, and esophagus demonstrate no significant findings. Lungs/Pleura: Lungs are clear. No pleural effusion or pneumothorax. Upper Abdomen: There is a small hiatal hernia with surgical sutures noted along the gastric region. Musculoskeletal: A chronic compression fracture deformity is seen at the level of T9. Multilevel degenerative changes are noted throughout the remainder of the thoracic spine. Review of the MIP images confirms the above findings. IMPRESSION: 1. No evidence of pulmonary embolism or other acute intrathoracic process. 2. Chronic compression fracture deformity at the level of T9. Electronically Signed   By: TVirgina NorfolkM.D.   On: 04/05/2022 00:10   DG Chest Portable 1 View  Result Date: 04/04/2022 CLINICAL DATA:  Abdominal pain. EXAM: PORTABLE CHEST 1 VIEW COMPARISON:  February 04, 2022 FINDINGS: The heart size and mediastinal contours are within normal limits. Low lung volumes are noted with mild, diffuse, chronic appearing increased lung markings. A trace amount of atelectasis is suspected within the bilateral lung bases. There is no evidence of an acute infiltrate, pleural effusion or pneumothorax. Multilevel degenerative changes are seen throughout the thoracic spine. IMPRESSION: Low lung volumes without evidence of acute or active cardiopulmonary disease. Electronically Signed   By: TVirgina NorfolkM.D.   On: 04/04/2022 23:38    EKG: Independently reviewed.   Assessment/Plan SBO -admit  to med tele  -ivfs   -anti-emetics  -KUB  this am  -patient unable to tolerate NG tube per ED notes  -await further surgery recs.   COPD -no acute exacerbation  -resume home controller medications    Schizo affective d/o  -resume home regimen -patient denies SI/HI -of note has psych hospitalization 7/23 for Suicide attempt   Anemia -stable continue to monitor    GERD -ppi    HTN -hold oral medications currently  -prn iv  -resume oral medications as able    DMII -fs /iss    Seizure d/o  -continue vimpat  -place on seizure precuations  Chronic compression fracture  -deformity at the level of T9 - supportive care  DVT prophylaxis:  Heparin  Code Status: fi;; Family Communication: n/a  Disposition Plan: patient  expected to be admitted greater than 2 midnights  Consults called: surgery: Stechschulte MD Admission status:inpatient   Clance Boll MD [facility]   If 7PM-7AM, please contact night-coverage [email or web address] Password [email or web address]  04/05/2022, 5:16 AM

## 2022-04-05 NOTE — ED Provider Notes (Signed)
Emergency Department Provider Note   I have reviewed the triage vital signs and the nursing notes.   HISTORY  Chief Complaint Abdominal Pain   HPI [name] is a 56 y.o. female past history reviewed below presents emergency department with severe abdominal pain with nausea/vomiting.  Symptoms began today and initially patient thought she may have food poisoning.  She has not had diarrhea.  She tells me she has been passing flatus but has become increasingly uncomfortable.  She has had some discomfort radiating up into the chest from time to time but most of her pain seems to be in the abdomen.  No dysuria, hesitancy, urgency.   Past Medical History:  Diagnosis Date   Abdominal pain    Accidental drug overdose April 2013   Anxiety    Atrial fibrillation ([place]) 09/29/11   converted spontaneously   Chronic back pain    Chronic knee pain    Chronic nausea    Chronic pain    COPD (chronic obstructive pulmonary disease) ([place])    Depression    Diabetes mellitus    states her doctor took her off all DM meds in past month   Diabetic neuropathy ([place])    Dyspnea    with exertion    GERD (gastroesophageal reflux disease)    Headache(784.0)    migraines    HTN (hypertension)    not on meds since in a year    Hyperlipidemia    Hypothyroidism    not on meds in a while    Mental disorder    Bipolar and schizophrenic   Requires supplemental oxygen    as needed per patient    Schizophrenia ([place])    Schizophrenia, acute ([place]) 11/13/2017   Tobacco abuse     Review of Systems  Constitutional: No fever/chills Eyes: No visual changes. ENT: No sore throat. Cardiovascular: Denies chest pain. Respiratory: Denies shortness of breath. Gastrointestinal: Positive abdominal pain. Positive nausea and vomiting.  No diarrhea.  No constipation. Genitourinary: Negative for dysuria. Musculoskeletal: Negative for back pain. Skin: Negative for rash. Neurological: Negative for  headaches.   ____________________________________________   PHYSICAL EXAM:  VITAL SIGNS: ED Triage Vitals  Enc Vitals Group     BP 04/04/22 2153 (!) 164/115     Pulse Rate 04/04/22 2153 94     Resp 04/04/22 2153 18     Temp 04/04/22 2153 (!) 97.1 F (36.2 C)     Temp Source 04/04/22 2153 Temporal     SpO2 04/04/22 2153 94 %     Weight 04/04/22 2152 107 lb 5.8 oz (48.7 kg)     Height 04/04/22 2152 5' (1.524 m)   Constitutional: Alert and oriented. Appears unwell.  Eyes: Conjunctivae are normal.  Head: Atraumatic. Nose: No congestion/rhinnorhea. Mouth/Throat: Mucous membranes are moist.   Neck: No stridor.   Cardiovascular: Normal rate, regular rhythm. Good peripheral circulation. Grossly normal heart sounds.   Respiratory: Normal respiratory effort.  No retractions. Lungs CTAB. Gastrointestinal: Abdomen is distended with diffuse tenderness.  Musculoskeletal: No lower extremity tenderness nor edema. No gross deformities of extremities. Neurologic:  Normal speech and language. No gross focal neurologic deficits are appreciated.  Skin:  Skin is warm, dry and intact. No rash noted.  ____________________________________________   LABS (all labs ordered are listed, but only abnormal results are displayed)  Labs Reviewed  COMPREHENSIVE METABOLIC PANEL - Abnormal; Notable for the following components:      Result Value   Sodium 134 (*)  Chloride 94 (*)    Glucose, Bld 216 (*)    Total Protein 8.6 (*)    AST 11 (*)    Alkaline Phosphatase 142 (*)    All other components within normal limits  URINALYSIS, ROUTINE W REFLEX MICROSCOPIC - Abnormal; Notable for the following components:   Specific Gravity, Urine 1.032 (*)    Protein, ur 30 (*)    Bacteria, UA RARE (*)    All other components within normal limits  RESP PANEL BY RT-PCR (FLU A&B, COVID) ARPGX2  LIPASE, BLOOD  CBC  LACTIC ACID, PLASMA  TROPONIN I (HIGH SENSITIVITY)    ____________________________________________  EKG   EKG Interpretation  Date/Time:  Friday April 04 2022 23:33:21 EDT Ventricular Rate:  95 PR Interval:  134 QRS Duration: 88 QT Interval:  364 QTC Calculation: 458 R Axis:   31 Text Interpretation: Sinus rhythm Left atrial enlargement Nonspecific ST changes Similar to prior Confirmed by Nanda Quinton [phone]) on 04/05/2022 12:08:10 AM        ____________________________________________  RADIOLOGY  CT ABDOMEN PELVIS W CONTRAST  Result Date: 04/05/2022 CLINICAL DATA:  Abdominal pain with nausea and vomiting. EXAM: CT ABDOMEN AND PELVIS WITH CONTRAST TECHNIQUE: Multidetector CT imaging of the abdomen and pelvis was performed using the standard protocol following bolus administration of intravenous contrast. RADIATION DOSE REDUCTION: This exam was performed according to the departmental dose-optimization program which includes automated exposure control, adjustment of the mA and/or kV according to patient size and/or use of iterative reconstruction technique. CONTRAST:  49m OMNIPAQUE IOHEXOL 350 MG/ML SOLN COMPARISON:  None Available. FINDINGS: Lower chest: No acute abnormality. Hepatobiliary: No focal liver abnormality is seen. No gallstones, gallbladder wall thickening, or biliary dilatation. Pancreas: Unremarkable. No pancreatic ductal dilatation or surrounding inflammatory changes. Spleen: Normal in size without focal abnormality. Adrenals/Urinary Tract: Adrenal glands are unremarkable. Kidneys are normal, without renal calculi, focal lesion, or hydronephrosis. Bladder is unremarkable. Stomach/Bowel: There is a small hiatal hernia with surgical sutures noted throughout the gastric region. Surgically anastomosed bowel is also seen within the anteromedial aspect of the mid to lower left abdomen. The appendix is not clearly identified. Multiple dilated small bowel loops are seen within the left lower quadrant and left hemipelvis (maximum  small bowel diameter of approximately 3.4 cm). A transition zone is noted within the posterior aspect of the pelvis (axial CT images 64 through 71, CT series 2). Mild twisting of the mesentery is suspected within the right lower quadrant and right hemipelvis (axial CT images 48 through 58, CT series 2). Vascular/Lymphatic: Aortic atherosclerosis. No enlarged abdominal or pelvic lymph nodes. Reproductive: Status post hysterectomy. No adnexal masses. Other: No abdominal wall hernia or abnormality. No abdominopelvic ascites. Musculoskeletal: No acute or significant osseous findings. IMPRESSION: 1. Small-bowel obstruction with a transition zone noted within the posterior aspect of the pelvis. 2. Mild twisting of the mesentery within the right lower quadrant and right hemipelvis which may represent sequelae associated with an internal hernia. 3. 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 4. Small hiatal hernia. 5. Aortic atherosclerosis. Aortic Atherosclerosis (ICD10-I70.0). Electronically Signed   By: TVirgina NorfolkM.D.   On: 04/05/2022 00:17   CT Angio Chest PE W and/or Wo Contrast  Result Date: 04/05/2022 CLINICAL DATA:  Decreasing oxygen saturation. EXAM: CT ANGIOGRAPHY CHEST WITH CONTRAST TECHNIQUE: Multidetector CT imaging of the chest was performed using the standard protocol during bolus administration of intravenous contrast. Multiplanar CT image reconstructions and MIPs were obtained to evaluate the vascular anatomy.  RADIATION DOSE REDUCTION: This exam was performed according to the departmental dose-optimization program which includes automated exposure control, adjustment of the mA and/or kV according to patient size and/or use of iterative reconstruction technique. CONTRAST:  27m OMNIPAQUE IOHEXOL 350 MG/ML SOLN COMPARISON:  Nov 14, 2021 FINDINGS: Cardiovascular: Satisfactory opacification of the pulmonary arteries to the segmental level. No evidence of pulmonary embolism.  Normal heart size. No pericardial effusion. Mediastinum/Nodes: No enlarged mediastinal, hilar, or axillary lymph nodes. Thyroid gland, trachea, and esophagus demonstrate no significant findings. Lungs/Pleura: Lungs are clear. No pleural effusion or pneumothorax. Upper Abdomen: There is a small hiatal hernia with surgical sutures noted along the gastric region. Musculoskeletal: A chronic compression fracture deformity is seen at the level of T9. Multilevel degenerative changes are noted throughout the remainder of the thoracic spine. Review of the MIP images confirms the above findings. IMPRESSION: 1. No evidence of pulmonary embolism or other acute intrathoracic process. 2. Chronic compression fracture deformity at the level of T9. Electronically Signed   By: TVirgina NorfolkM.D.   On: 04/05/2022 00:10   DG Chest Portable 1 View  Result Date: 04/04/2022 CLINICAL DATA:  Abdominal pain. EXAM: PORTABLE CHEST 1 VIEW COMPARISON:  February 04, 2022 FINDINGS: The heart size and mediastinal contours are within normal limits. Low lung volumes are noted with mild, diffuse, chronic appearing increased lung markings. A trace amount of atelectasis is suspected within the bilateral lung bases. There is no evidence of an acute infiltrate, pleural effusion or pneumothorax. Multilevel degenerative changes are seen throughout the thoracic spine. IMPRESSION: Low lung volumes without evidence of acute or active cardiopulmonary disease. Electronically Signed   By: TVirgina NorfolkM.D.   On: 04/04/2022 23:38    ____________________________________________   PROCEDURES  Procedure(s) performed:   Procedures  CRITICAL CARE Performed by: JMargette FastTotal critical care time: 35 minutes Critical care time was exclusive of separately billable procedures and treating other patients. Critical care was necessary to treat or prevent imminent or life-threatening deterioration. Critical care was time spent personally by me  on the following activities: development of treatment plan with patient and/or surrogate as well as nursing, discussions with consultants, evaluation of patient's response to treatment, examination of patient, obtaining history from patient or surrogate, ordering and performing treatments and interventions, ordering and review of laboratory studies, ordering and review of radiographic studies, pulse oximetry and re-evaluation of patient's condition.  JNanda Quinton MD Emergency Medicine  ____________________________________________   INITIAL IMPRESSION / ASSESSMENT AND PLAN / ED COURSE  Pertinent labs & imaging results that were available during my care of the patient were reviewed by me and considered in my medical decision making (see chart for details).   This patient is Presenting for Evaluation of abdominal pain, which does require a range of treatment options, and is a complaint that involves a high risk of morbidity and mortality.  The Differential Diagnoses includes but is not exclusive to acute cholecystitis, intrathoracic causes for epigastric abdominal pain, gastritis, duodenitis, pancreatitis, small bowel or large bowel obstruction, abdominal aortic aneurysm, hernia, gastritis, etc.   Critical Interventions-    Medications  ondansetron (ZOFRAN) injection 4 mg (4 mg Intravenous Given 04/04/22 2159)  sodium chloride 0.9 % bolus 1,000 mL (1,000 mLs Intravenous New Bag/Given 04/04/22 2335)  fentaNYL (SUBLIMAZE) injection 100 mcg (100 mcg Intravenous Given 04/04/22 2336)  ondansetron (ZOFRAN) injection 4 mg (4 mg Intravenous Given 04/04/22 2336)  iohexol (OMNIPAQUE) 350 MG/ML injection 100 mL (60 mLs Intravenous Contrast Given 04/04/22  2352)    Reassessment after intervention:  Pain symptoms improved with medication.    I did obtain Additional Historical Information from daughter at bedside.   I decided to review pertinent External Data, and in summary patient with prior history  of gastric bypass.   Clinical Laboratory Tests Ordered, included COVID-negative.  Lipase, LFTs, bilirubin are normal.  Hyperglycemia without DKA.  No UTI.  Troponin normal.  Radiologic Tests Ordered, included CXR, CTA PE, and CT abdomen/pelvis. I independently interpreted the images and agree with radiology interpretation.   Cardiac Monitor Tracing which shows NSR.    Social Determinants of Health Risk patient is a smoker.   Consult complete with General Surgery, Dr. Thermon Leyland. Plan will be for NG tube and urgent transfer to the [facility] ED for surgical evaluation with internal hernia on the differential.   Medical Decision Making: Summary:  Patient presents emergency department with abdominal discomfort radiating into the chest.  She has had some vomiting with hypoxemia here although no respiratory distress or wheezing.  PE study of the chest negative along with chest x-ray.  COVID-negative.  CT imaging shows bowel obstruction with concern for possible internal hernia.  Discussed this with radiology as well as general surgery.  Plan is for urgent transfer to the [facility] ED for surgical evaluation.  We will place NG tube prior to transfer.  Updated patient and family and on reassessment patient is feeling improved after pain medication but understands the need for NG tube placement and transfer.  Dr. Betsey Holiday accepts patient to the [facility] ED. [facility] delayed. Will call local EMS with need for urgent surgical evaluation.   01:10 AM  Local EMS arrived for transport. Patient would not tolerate NG tube after two attempts. Will hold on tube and elect for urgent transport and surgery evaluation.   Disposition: Transfer to the [facility] ED.   ____________________________________________  FINAL CLINICAL IMPRESSION(S) / ED DIAGNOSES  Final diagnoses:  SBO (small bowel obstruction) (HCC)  Precordial chest pain  Hypoxemia    Note:  This document was prepared using Dragon voice recognition software and may  include unintentional dictation errors.  Nanda Quinton, MD, [facility] Emergency Medicine    Sharmain Lastra, [facility], MD 04/05/22 [phone]

## 2022-04-05 NOTE — ED Notes (Signed)
Pt had 4 episodes of vomiting, now resolved without requiring medication. HOB elevated d/t concerns for aspiration. Pt visible from nurses station.

## 2022-04-05 NOTE — Consult Note (Signed)
Consulting Physician: Nickola Major Ylianna Almanzar  Referring Provider: Dr. Dayna Barker  Chief Complaint: Abdominal pain, nausea, vomiting  Reason for Consult: Possible internal hernia in gastric bypass patient   Subjective   HPI: [name] is an 56 y.o. female who is here for abdominal pain, nausea, and vomiting.  She was at a funeral earlier today and thought she may have gotten food poisoning from something she ate.  However she continued to have abdominal pain and so she presented to [facility].  She was subsequently transferred to [facility] for evaluation by the surgery team.  There is some concern for internal hernia on CT scan.  She was recently admitted to the hospital after suicide attempt and had a prolonged complicated hospitalization.  During her stay, she was diagnosed with a colonic ileus and seen and evaluated by the gastroenterology team.  Past Medical History:  Diagnosis Date   Abdominal pain    Accidental drug overdose April 2013   Anxiety    Atrial fibrillation ([place]) 09/29/11   converted spontaneously   Chronic back pain    Chronic knee pain    Chronic nausea    Chronic pain    COPD (chronic obstructive pulmonary disease) ([place])    Depression    Diabetes mellitus    states her doctor took her off all DM meds in past month   Diabetic neuropathy ([place])    Dyspnea    with exertion    GERD (gastroesophageal reflux disease)    Headache(784.0)    migraines    HTN (hypertension)    not on meds since in a year    Hyperlipidemia    Hypothyroidism    not on meds in a while    Mental disorder    Bipolar and schizophrenic   Requires supplemental oxygen    as needed per patient    Schizophrenia ([place])    Schizophrenia, acute ([place]) 11/13/2017   Tobacco abuse     Past Surgical History:  Procedure Laterality Date   ABDOMINAL HYSTERECTOMY     BLADDER SUSPENSION  03/04/2011   Procedure: [facility] PROCEDURE;  Surgeon: Elayne Snare MacDiarmid;  Location: [facility];   Service: Urology;  Laterality: N/A;   CYSTOCELE REPAIR  03/04/2011   Procedure: ANTERIOR REPAIR (CYSTOCELE);  Surgeon: Reece Packer;  Location: [facility];  Service: Urology;  Laterality: N/A;   CYSTOSCOPY  03/04/2011   Procedure: CYSTOSCOPY;  Surgeon: Elayne Snare MacDiarmid;  Location: [facility];  Service: Urology;  Laterality: N/A;   ESOPHAGOGASTRODUODENOSCOPY (EGD) WITH PROPOFOL N/A 05/12/2017   Procedure: ESOPHAGOGASTRODUODENOSCOPY (EGD) WITH PROPOFOL;  Surgeon: Alphonsa Overall, MD;  Location: [facility];  Service: General;  Laterality: N/A;   GASTRIC ROUX-EN-Y N/A 03/25/2016   Procedure: LAPAROSCOPIC ROUX-EN-Y GASTRIC BYPASS WITH UPPER ENDOSCOPY;  Surgeon: Excell Seltzer, MD;  Location: [facility];  Service: General;  Laterality: N/A;   KNEE SURGERY     LAPAROSCOPIC ASSISTED VAGINAL HYSTERECTOMY  03/04/2011   Procedure: LAPAROSCOPIC ASSISTED VAGINAL HYSTERECTOMY;  Surgeon: Cyril Mourning, MD;  Location: [facility];  Service: Gynecology;  Laterality: N/A;    Family History  Problem Relation Age of Onset   Heart attack Father        24s   Diabetes Mother    Heart disease Mother    Hypertension Mother    Heart attack Sister        68   COPD Other    Breast cancer Neg Hx     Social:  reports that she has been smoking cigarettes. She has been smoking an average of .5 packs per day. She has never used smokeless tobacco. She reports current alcohol use. She reports current drug use. Drugs: Cocaine, Marijuana, and "Crack" cocaine.  Allergies:  Allergies  Allergen Reactions   Iron Dextran Shortness Of Breath and Anxiety   Aspirin Nausea And Vomiting and Other (See Comments)    Ok to take tylenol or ibuprofen     Medications: Current Outpatient Medications  Medication Instructions   albuterol (PROAIR HFA) 108 (90 Base) MCG/ACT inhaler 2 puffs, Inhalation, Every 6 hours PRN   amLODipine (NORVASC) 5 mg, Oral, Daily   ARIPiprazole (ABILIFY) 5 mg, Oral, 2 times daily   ascorbic acid (VITAMIN  C) 500 mg, Oral, Daily   Cholecalciferol 1,000 Units, Oral, Daily   ferrous sulfate 325 mg, Oral, Daily with breakfast   folic acid (FOLVITE) 1 mg, Oral, Daily   gabapentin (NEURONTIN) 300 MG capsule Take 1 capsule (300 mg total) by mouth daily AND 2 capsules (600 mg total) at bedtime.   guaiFENesin (MUCINEX) 600 mg, Oral, 2 times daily   ibuprofen (ADVIL) 800 mg, Oral, 3 times daily PRN   insulin aspart (NOVOLOG) 0-15 Units, Subcutaneous, 3 times daily with meals, CBG < 70: treat hypoglycemia  <BR>CBG 70 - 120: 0 units <BR>CBG 121 - 150: 2 units <BR>CBG 151 - 200: 3 units <BR>CBG 201 - 250: 5 units <BR>CBG 251 - 300: 8 units <BR>CBG 301 - 350: 11 units <BR>CBG 351 - 400: 15 units <BR>CBG > 400: call MD   ipratropium-albuterol (DUONEB) 0.5-2.5 (3) MG/3ML SOLN 3 mLs, Nebulization, Every 6 hours PRN   lactulose (CHRONULAC) 30 g, Oral, 2 times daily   linaclotide (LINZESS) 145 mcg, Oral, Daily before breakfast   LORazepam (ATIVAN) 1 mg, Intravenous, 2 times daily   metFORMIN (GLUCOPHAGE) 500 mg, Oral, 2 times daily with meals   metoprolol tartrate (LOPRESSOR) 50 mg, Oral, 2 times daily   Multiple Vitamin (MULTIVITAMIN WITH MINERALS) TABS tablet 1 tablet, Oral, 2 times daily   nicotine (NICODERM CQ - DOSED IN MG/24 HOURS) 21 mg, Transdermal, Daily, (May purchase from over the counter): For nicotine withdrawal symptoms   pantoprazole (PROTONIX) 40 mg, Oral, Daily   risperiDONE (RISPERDAL M-TABS) 0.5 mg, Oral, 2 times daily PRN   SUMAtriptan 6 mg, Intramuscular, Daily PRN   SYMBICORT 160-4.5 MCG/ACT inhaler 2 puffs, Inhalation, Daily PRN   vitamin A 10,000 Units, Oral, Daily    ROS - all of the below systems have been reviewed with the patient and positives are indicated with bold text General: chills, fever or night sweats Eyes: blurry vision or double vision ENT: epistaxis or sore throat Allergy/Immunology: itchy/watery eyes or nasal congestion Hematologic/Lymphatic: bleeding problems, blood  clots or swollen lymph nodes Endocrine: temperature intolerance or unexpected weight changes Breast: new or changing breast lumps or nipple discharge Resp: cough, shortness of breath, or wheezing CV: chest pain or dyspnea on exertion GI: as per HPI GU: dysuria, trouble voiding, or hematuria MSK: joint pain or joint stiffness Neuro: TIA or stroke symptoms Derm: pruritus and skin lesion changes Psych: anxiety and depression  Objective   PE Blood pressure 131/84, pulse (!) 102, temperature 98.7 F (37.1 C), temperature source Oral, resp. rate 20, height 5' (1.524 m), weight 48.7 kg, last menstrual period 01/08/2011, SpO2 95 %. Constitutional: NAD; conversant; no deformities Eyes: Moist conjunctiva; no lid lag; anicteric; PERRL Neck: Trachea midline; no thyromegaly Lungs: Normal respiratory effort; no tactile  fremitus CV: RRR; no palpable thrills; no pitting edema GI: Abd tender to palpation, but tenderness is distractible with some voluntary guarding. MSK: Normal range of motion of extremities; no clubbing/cyanosis Psychiatric: Appropriate affect; alert and oriented x3 Lymphatic: No palpable cervical or axillary lymphadenopathy  Results for orders placed or performed during the hospital encounter of 04/04/22 (from the past 24 hour(s))  Lipase, blood     Status: None   Collection Time: 04/04/22 10:01 PM  Result Value Ref Range   Lipase 31 11 - 51 U/L  Comprehensive metabolic panel     Status: Abnormal   Collection Time: 04/04/22 10:01 PM  Result Value Ref Range   Sodium 134 (L) 135 - 145 mmol/L   Potassium 4.4 3.5 - 5.1 mmol/L   Chloride 94 (L) 98 - 111 mmol/L   CO2 28 22 - 32 mmol/L   Glucose, Bld 216 (H) 70 - 99 mg/dL   BUN 9 6 - 20 mg/dL   Creatinine, Ser 0.53 0.44 - 1.00 mg/dL   Calcium 9.9 8.9 - 10.3 mg/dL   Total Protein 8.6 (H) 6.5 - 8.1 g/dL   Albumin 4.3 3.5 - 5.0 g/dL   AST 11 (L) 15 - 41 U/L   ALT 14 0 - 44 U/L   Alkaline Phosphatase 142 (H) 38 - 126 U/L   Total  Bilirubin 0.4 0.3 - 1.2 mg/dL   GFR, Estimated >60 >60 mL/min   Anion gap 12 5 - 15  CBC     Status: None   Collection Time: 04/04/22 10:01 PM  Result Value Ref Range   WBC 8.9 4.0 - 10.5 K/uL   RBC 5.09 3.87 - 5.11 MIL/uL   Hemoglobin 14.7 12.0 - 15.0 g/dL   HCT 44.6 36.0 - 46.0 %   MCV 87.6 80.0 - 100.0 fL   MCH 28.9 26.0 - 34.0 pg   MCHC 33.0 30.0 - 36.0 g/dL   RDW 15.2 11.5 - 15.5 %   Platelets 294 150 - 400 K/uL   nRBC 0.0 0.0 - 0.2 %  Urinalysis, Routine w reflex microscopic Urine, Clean Catch     Status: Abnormal   Collection Time: 04/04/22 10:01 PM  Result Value Ref Range   Color, Urine YELLOW YELLOW   APPearance CLEAR CLEAR   Specific Gravity, Urine 1.032 (H) 1.005 - 1.030   pH 5.5 5.0 - 8.0   Glucose, UA NEGATIVE NEGATIVE mg/dL   Hgb urine dipstick NEGATIVE NEGATIVE   Bilirubin Urine NEGATIVE NEGATIVE   Ketones, ur NEGATIVE NEGATIVE mg/dL   Protein, ur 30 (A) NEGATIVE mg/dL   Nitrite NEGATIVE NEGATIVE   Leukocytes,Ua NEGATIVE NEGATIVE   RBC / HPF 0-5 0 - 5 RBC/hpf   WBC, UA 0-5 0 - 5 WBC/hpf   Bacteria, UA RARE (A) NONE SEEN   Mucus PRESENT   Troponin I (High Sensitivity)     Status: None   Collection Time: 04/04/22 10:01 PM  Result Value Ref Range   Troponin I (High Sensitivity) <2 <18 ng/L  Resp Panel by RT-PCR (Flu A&B, Covid) Anterior Nasal Swab     Status: None   Collection Time: 04/04/22 11:42 PM   Specimen: Anterior Nasal Swab  Result Value Ref Range   SARS Coronavirus 2 by RT PCR NEGATIVE NEGATIVE   Influenza A by PCR NEGATIVE NEGATIVE   Influenza B by PCR NEGATIVE NEGATIVE  Lactic acid, plasma     Status: None   Collection Time: 04/05/22  1:01 AM  Result Value Ref  Range   Lactic Acid, Venous 0.8 0.5 - 1.9 mmol/L  Troponin I (High Sensitivity)     Status: None   Collection Time: 04/05/22  1:22 AM  Result Value Ref Range   Troponin I (High Sensitivity) 2 <18 ng/L     Imaging Orders         DG Chest Portable 1 View         CT Angio Chest PE W  and/or Wo Contrast         CT ABDOMEN PELVIS W CONTRAST    1. Small-bowel obstruction with a transition zone noted within the posterior aspect of the pelvis. 2. Mild twisting of the mesentery within the right lower quadrant and right hemipelvis which may represent sequelae associated with an internal hernia. 3. 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 4. Small hiatal hernia. 5. Aortic atherosclerosis.       DG Abdomen 1 View      On my review of the CT, there is some gas and stool in the colon.  The remnant stomach appears decompressed.  The jejunojejunostomy is located in the appropriate position in the left abdomen.  There is a loop of mildly dilated small intestine, and there is mild twisting to the mesentery in this area.  Assessment and Plan   LAKIN RHINE is an 56 y.o. female with a history of gastric bypass in 2017 by Dr. Excell Seltzer, who presents with nausea vomiting abdominal pain.  It is reassuring that her remnant stomach is not dilated on CT, and the jejunojejunostomy is in the appropriate anatomic position.  I see the mild swirling of the mesentery noted by the radiologist.  The patient however is passing gas and having bowel movements throughout the day today and has relatively normal blood work with a distractible exam.  I am not convinced that she has an internal hernia, and I do not feel she needs to be rushed to the operating room.  I think a short trial of conservative management with n.p.o. status would be appropriate.  We could consider checking a small bowel follow-through in the morning when she is more awake if her symptoms allow.  If she shows any signs of worsening, we may have to proceed to the operating room for exploration.    ICD-10-CM   1. SBO (small bowel obstruction) ([place])  K56.609     2. Precordial chest pain  R07.2     3. Hypoxemia  R09.02        Felicie Morn, [facility] Use [email or web address] to  contact on call provider  New Patient Billing: [phone] - High MDM

## 2022-04-05 NOTE — ED Provider Notes (Signed)
[facility] EMERGENCY DEPARTMENT Provider Note   CSN: [id number] Arrival date & time: 04/04/22  2147     History  Chief Complaint  Patient presents with   Abdominal Pain    [name] is a 56 y.o. female.  56 yo F transferred from med center for SBO and concern for internal hernia. Attempted NGT X 2 at [facility] without success, thought to be related to intolerance. Patient had pain meds and ativan pta. Feels 'ok' now but with some nausea. Sleepy on exam.    Abdominal Pain      Home Medications Prior to Admission medications   Medication Sig Start Date End Date Taking? Authorizing Provider  albuterol (PROAIR HFA) 108 (90 Base) MCG/ACT inhaler Inhale 2 puffs into the lungs every 6 (six) hours as needed for wheezing or shortness of breath. 11/16/17  Yes Nwoko, Herbert Pun I, NP  amLODipine (NORVASC) 5 MG tablet Take 1 tablet (5 mg total) by mouth daily. 02/12/22 04/06/23 Yes Elodia Florence., MD  ARIPiprazole (ABILIFY) 5 MG tablet Take 1 tablet (5 mg total) by mouth 2 (two) times daily. 02/12/22 04/06/23 Yes Elodia Florence., MD  ascorbic acid (VITAMIN C) 500 MG tablet Take 1 tablet (500 mg total) by mouth daily. 02/12/22  Yes Elodia Florence., MD  Cholecalciferol 25 MCG (1000 UT) capsule Take 1 capsule (1,000 Units total) by mouth daily. 08/03/20  Yes Florencia Reasons, MD  gabapentin (NEURONTIN) 300 MG capsule Take 1 capsule (300 mg total) by mouth daily AND 2 capsules (600 mg total) at bedtime. Patient taking differently: Take 1 capsule (300 mg total) by mouth daily.  02/12/22 04/06/23 Yes Elodia Florence., MD  ibuprofen (ADVIL) 800 MG tablet Take 800 mg by mouth 3 (three) times daily as needed for pain. 12/31/21  Yes [provider]  lactulose (CHRONULAC) 10 GM/15ML solution Take 45 mLs (30 g total) by mouth 2 (two) times daily. 02/12/22  Yes Elodia Florence., MD  Multiple Vitamin (MULTIVITAMIN WITH MINERALS) TABS tablet Take 1 tablet by mouth 2  (two) times daily. Patient taking differently: Take 1 tablet by mouth daily. 07/27/20  Yes Florencia Reasons, MD  pantoprazole (PROTONIX) 40 MG tablet Take 1 tablet (40 mg total) by mouth daily. 11/16/21  Yes Thurnell Lose, MD  PARoxetine (PAXIL) 20 MG tablet Take 20 mg by mouth every morning. 03/14/22  Yes [provider]  QUEtiapine (SEROQUEL) 400 MG tablet Take 400 mg by mouth at bedtime. 03/14/22  Yes [provider]  SUMAtriptan 6 MG/0.5ML SOAJ Inject 6 mg into the muscle daily as needed for migraine. 11/01/21  Yes [provider]  folic acid (FOLVITE) 1 MG tablet Take 1 tablet (1 mg total) by mouth daily. Patient not taking: Reported on 04/05/2022 02/12/22   Elodia Florence., MD  insulin aspart (NOVOLOG) 100 UNIT/ML injection Inject 0-15 Units into the skin 3 (three) times daily with meals. CBG < 70: treat hypoglycemia   CBG 70 - 120: 0 units  CBG 121 - 150: 2 units  CBG 151 - 200: 3 units  CBG 201 - 250: 5 units  CBG 251 - 300: 8 units  CBG 301 - 350: 11 units  CBG 351 - 400: 15 units  CBG > 400: call MD Patient not taking: Reported on 04/05/2022 02/12/22   Elodia Florence., MD  ipratropium-albuterol (DUONEB) 0.5-2.5 (3) MG/3ML SOLN Take 3 mLs by nebulization every 6 (six) hours as  needed. Patient not taking: Reported on 04/05/2022 02/12/22   Elodia Florence., MD  linaclotide [facility]) 145 MCG CAPS capsule Take 1 capsule (145 mcg total) by mouth daily before breakfast. Patient not taking: Reported on 04/05/2022 02/13/22   Elodia Florence., MD  LORazepam (ATIVAN) 2 MG/ML injection Inject 0.5 mLs (1 mg total) into the vein 2 (two) times daily. Patient not taking: Reported on 04/05/2022 02/12/22   Elodia Florence., MD  metFORMIN (GLUCOPHAGE) 500 MG tablet Take 1 tablet (500 mg total) by mouth 2 (two) times daily with a meal. Patient not taking: Reported on 04/05/2022 11/16/21 12/16/21  Thurnell Lose, MD  metoprolol tartrate (LOPRESSOR) 50 MG  tablet Take 1 tablet (50 mg total) by mouth 2 (two) times daily. Patient not taking: Reported on 04/05/2022 02/12/22 04/06/23  Elodia Florence., MD  nicotine (NICODERM CQ - DOSED IN MG/24 HOURS) 21 mg/24hr patch Place 1 patch (21 mg total) onto the skin daily. (May purchase from over the counter): For nicotine withdrawal symptoms Patient not taking: Reported on 04/05/2022 11/16/21   Thurnell Lose, MD  risperiDONE (RISPERDAL M-TABS) 0.5 MG disintegrating tablet Take 1 tablet (0.5 mg total) by mouth 2 (two) times daily as needed (agitation, insomnia,a ggression). Patient not taking: Reported on 04/05/2022 02/12/22   Elodia Florence., MD  SYMBICORT 160-4.5 MCG/ACT inhaler Inhale 2 puffs into the lungs daily as needed (sob and wheezing). Patient not taking: Reported on 11/13/2021 11/16/17   Lindell Spar I, NP  vitamin A 3 MG (10000 UNITS) capsule Take 1 capsule (10,000 Units total) by mouth daily. Patient not taking: Reported on 04/05/2022 02/12/22 04/13/22  Elodia Florence., MD      Allergies    Iron dextran and Aspirin    Review of Systems   Review of Systems  Gastrointestinal:  Positive for abdominal pain.    Physical Exam Updated Vital Signs BP 137/81   Pulse (!) 107   Temp 98.5 F (36.9 C) (Oral)   Resp 19   Ht 5' (1.524 m)   Wt 48.7 kg   LMP 01/08/2011   SpO2 93%   BMI 20.97 kg/m  Physical Exam Vitals and nursing note reviewed.  Constitutional:      Appearance: She is well-developed.  HENT:     Head: Normocephalic and atraumatic.  Cardiovascular:     Rate and Rhythm: Normal rate and regular rhythm.  Pulmonary:     Effort: No respiratory distress.     Breath sounds: No stridor.  Abdominal:     General: There is no distension.     Tenderness: There is generalized abdominal tenderness.  Musculoskeletal:     Cervical back: Normal range of motion.  Neurological:     Mental Status: She is alert.     ED Results / Procedures / Treatments   Labs (all  labs ordered are listed, but only abnormal results are displayed) Labs Reviewed  COMPREHENSIVE METABOLIC PANEL - Abnormal; Notable for the following components:      Result Value   Sodium 134 (*)    Chloride 94 (*)    Glucose, Bld 216 (*)    Total Protein 8.6 (*)    AST 11 (*)    Alkaline Phosphatase 142 (*)    All other components within normal limits  URINALYSIS, ROUTINE W REFLEX MICROSCOPIC - Abnormal; Notable for the following components:   Specific Gravity, Urine 1.032 (*)    Protein, ur 30 (*)  Bacteria, UA RARE (*)    All other components within normal limits  COMPREHENSIVE METABOLIC PANEL - Abnormal; Notable for the following components:   Sodium 132 (*)    Glucose, Bld 167 (*)    BUN <5 (*)    Creatinine, Ser 0.39 (*)    Calcium 8.6 (*)    Albumin 3.3 (*)    AST 14 (*)    Alkaline Phosphatase 128 (*)    Total Bilirubin 0.2 (*)    All other components within normal limits  HEMOGLOBIN A1C - Abnormal; Notable for the following components:   Hgb A1c MFr Bld 6.5 (*)    All other components within normal limits  GLUCOSE, CAPILLARY - Abnormal; Notable for the following components:   Glucose-Capillary 157 (*)    All other components within normal limits  CBG MONITORING, ED - Abnormal; Notable for the following components:   Glucose-Capillary 160 (*)    All other components within normal limits  CBG MONITORING, ED - Abnormal; Notable for the following components:   Glucose-Capillary 124 (*)    All other components within normal limits  CBG MONITORING, ED - Abnormal; Notable for the following components:   Glucose-Capillary 187 (*)    All other components within normal limits  RESP PANEL BY RT-PCR (FLU A&B, COVID) ARPGX2  LIPASE, BLOOD  CBC  LACTIC ACID, PLASMA  CBC WITH DIFFERENTIAL/PLATELET  TSH  CBC  RENAL FUNCTION PANEL  CBC WITH DIFFERENTIAL/PLATELET  TROPONIN I (HIGH SENSITIVITY)  TROPONIN I (HIGH SENSITIVITY)    EKG EKG  Interpretation  Date/Time:  Friday April 04 2022 23:33:21 EDT Ventricular Rate:  95 PR Interval:  134 QRS Duration: 88 QT Interval:  364 QTC Calculation: 458 R Axis:   31 Text Interpretation: Sinus rhythm Left atrial enlargement Nonspecific ST changes Similar to prior Confirmed by Nanda Quinton [phone]) on 04/05/2022 12:08:10 AM  Radiology DG Abd Portable 1V-Small Bowel Obstruction Protocol-initial, 8 hr delay  Result Date: 04/05/2022 CLINICAL DATA:  NG tube placement EXAM: PORTABLE ABDOMEN - 1 VIEW COMPARISON:  04/05/2022 FINDINGS: Enteric tube terminates in the proximal gastric body. Dilated loops of small bowel in the central abdomen. Contrast within pelvic loops of distal small bowel. IMPRESSION: Enteric tube terminates in the proximal gastric body. Electronically Signed   By: Julian Hy M.D.   On: 04/05/2022 22:26   DG Abd Portable 1V  Result Date: 04/05/2022 CLINICAL DATA:  NG tube placement. EXAM: PORTABLE ABDOMEN - 1 VIEW COMPARISON:  04/05/2022, earlier same day FINDINGS: NG tube is positioned with the tip in the gastric antrum. Proximal side port well below the GE junction. Gaseous distention of small bowel visible in the upper abdomen. IMPRESSION: NG tube tip is in the gastric antrum. Electronically Signed   By: Misty Stanley M.D.   On: 04/05/2022 11:43   DG Abd Portable 1V  Result Date: 04/05/2022 CLINICAL DATA:  NG tube placement EXAM: PORTABLE ABDOMEN - 1 VIEW COMPARISON:  04/04/2022 FINDINGS: Limited radiograph of the lower chest and upper abdomen was obtained for the purposes of enteric tube localization. Enteric tube is seen coursing below the diaphragm and is coiled in the left upper quadrant with distal tip terminating near the midline, likely within the proximal small bowel. Tube appears partially kinked proximal to the side port. IMPRESSION: Enteric tube coiled in the left upper quadrant, likely terminating within the proximal small bowel. Tube appears partially  kinked proximal to the side port. Electronically Signed   By: Davina Poke D.O.  On: 04/05/2022 10:03   CT ABDOMEN PELVIS W CONTRAST  Result Date: 04/05/2022 CLINICAL DATA:  Abdominal pain with nausea and vomiting. EXAM: CT ABDOMEN AND PELVIS WITH CONTRAST TECHNIQUE: Multidetector CT imaging of the abdomen and pelvis was performed using the standard protocol following bolus administration of intravenous contrast. RADIATION DOSE REDUCTION: This exam was performed according to the departmental dose-optimization program which includes automated exposure control, adjustment of the mA and/or kV according to patient size and/or use of iterative reconstruction technique. CONTRAST:  45m OMNIPAQUE IOHEXOL 350 MG/ML SOLN COMPARISON:  None Available. FINDINGS: Lower chest: No acute abnormality. Hepatobiliary: No focal liver abnormality is seen. No gallstones, gallbladder wall thickening, or biliary dilatation. Pancreas: Unremarkable. No pancreatic ductal dilatation or surrounding inflammatory changes. Spleen: Normal in size without focal abnormality. Adrenals/Urinary Tract: Adrenal glands are unremarkable. Kidneys are normal, without renal calculi, focal lesion, or hydronephrosis. Bladder is unremarkable. Stomach/Bowel: There is a small hiatal hernia with surgical sutures noted throughout the gastric region. Surgically anastomosed bowel is also seen within the anteromedial aspect of the mid to lower left abdomen. The appendix is not clearly identified. Multiple dilated small bowel loops are seen within the left lower quadrant and left hemipelvis (maximum small bowel diameter of approximately 3.4 cm). A transition zone is noted within the posterior aspect of the pelvis (axial CT images 64 through 71, CT series 2). Mild twisting of the mesentery is suspected within the right lower quadrant and right hemipelvis (axial CT images 48 through 58, CT series 2). Vascular/Lymphatic: Aortic atherosclerosis. No enlarged  abdominal or pelvic lymph nodes. Reproductive: Status post hysterectomy. No adnexal masses. Other: No abdominal wall hernia or abnormality. No abdominopelvic ascites. Musculoskeletal: No acute or significant osseous findings. IMPRESSION: 1. Small-bowel obstruction with a transition zone noted within the posterior aspect of the pelvis. 2. Mild twisting of the mesentery within the right lower quadrant and right hemipelvis which may represent sequelae associated with an internal hernia. 3. 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 4. Small hiatal hernia. 5. Aortic atherosclerosis. Aortic Atherosclerosis (ICD10-I70.0). Electronically Signed   By: TVirgina NorfolkM.D.   On: 04/05/2022 00:17   CT Angio Chest PE W and/or Wo Contrast  Result Date: 04/05/2022 CLINICAL DATA:  Decreasing oxygen saturation. EXAM: CT ANGIOGRAPHY CHEST WITH CONTRAST TECHNIQUE: Multidetector CT imaging of the chest was performed using the standard protocol during bolus administration of intravenous contrast. Multiplanar CT image reconstructions and MIPs were obtained to evaluate the vascular anatomy. RADIATION DOSE REDUCTION: This exam was performed according to the departmental dose-optimization program which includes automated exposure control, adjustment of the mA and/or kV according to patient size and/or use of iterative reconstruction technique. CONTRAST:  647mOMNIPAQUE IOHEXOL 350 MG/ML SOLN COMPARISON:  Nov 14, 2021 FINDINGS: Cardiovascular: Satisfactory opacification of the pulmonary arteries to the segmental level. No evidence of pulmonary embolism. Normal heart size. No pericardial effusion. Mediastinum/Nodes: No enlarged mediastinal, hilar, or axillary lymph nodes. Thyroid gland, trachea, and esophagus demonstrate no significant findings. Lungs/Pleura: Lungs are clear. No pleural effusion or pneumothorax. Upper Abdomen: There is a small hiatal hernia with surgical sutures noted along the gastric  region. Musculoskeletal: A chronic compression fracture deformity is seen at the level of T9. Multilevel degenerative changes are noted throughout the remainder of the thoracic spine. Review of the MIP images confirms the above findings. IMPRESSION: 1. No evidence of pulmonary embolism or other acute intrathoracic process. 2. Chronic compression fracture deformity at the level of T9. Electronically Signed   By:  Virgina Norfolk M.D.   On: 04/05/2022 00:10   DG Chest Portable 1 View  Result Date: 04/04/2022 CLINICAL DATA:  Abdominal pain. EXAM: PORTABLE CHEST 1 VIEW COMPARISON:  February 04, 2022 FINDINGS: The heart size and mediastinal contours are within normal limits. Low lung volumes are noted with mild, diffuse, chronic appearing increased lung markings. A trace amount of atelectasis is suspected within the bilateral lung bases. There is no evidence of an acute infiltrate, pleural effusion or pneumothorax. Multilevel degenerative changes are seen throughout the thoracic spine. IMPRESSION: Low lung volumes without evidence of acute or active cardiopulmonary disease. Electronically Signed   By: Virgina Norfolk M.D.   On: 04/04/2022 23:38    Procedures .Critical Care  Performed by: Merrily Pew, MD Authorized by: Merrily Pew, MD   Critical care provider statement:    Critical care time (minutes):  30   Critical care was time spent personally by me on the following activities:  Development of treatment plan with patient or surrogate, discussions with consultants, evaluation of patient's response to treatment, examination of patient, ordering and review of laboratory studies, ordering and review of radiographic studies, ordering and performing treatments and interventions, pulse oximetry, re-evaluation of patient's condition and review of old charts   I assumed direction of critical care for this patient from another provider in my specialty: yes     Care discussed with: admitting provider        Medications Ordered in ED Medications  ondansetron (ZOFRAN) injection 4 mg (0 mg Intravenous Hold 04/05/22 0505)  insulin aspart (novoLOG) injection 0-6 Units (1 Units Subcutaneous Given 04/05/22 2050)  heparin injection 5,000 Units (5,000 Units Subcutaneous Given 04/05/22 2150)  lactated ringers infusion ( Intravenous New Bag/Given 04/05/22 0917)  fentaNYL (SUBLIMAZE) injection 12.5-50 mcg (50 mcg Intravenous Given 04/05/22 2146)  ondansetron (ZOFRAN) tablet 4 mg ( Oral See Alternative 04/05/22 1413)    Or  ondansetron (ZOFRAN) injection 4 mg (4 mg Intravenous Given 04/05/22 1413)  prochlorperazine (COMPAZINE) injection 5 mg (5 mg Intravenous Given 04/05/22 1107)  lacosamide (VIMPAT) tablet 50 mg (50 mg Oral Not Given 04/05/22 2148)  ondansetron (ZOFRAN) injection 4 mg (4 mg Intravenous Given 04/04/22 2159)  sodium chloride 0.9 % bolus 1,000 mL (0 mLs Intravenous Stopped 04/05/22 0501)  fentaNYL (SUBLIMAZE) injection 100 mcg (100 mcg Intravenous Given 04/04/22 2336)  ondansetron (ZOFRAN) injection 4 mg (4 mg Intravenous Given 04/04/22 2336)  iohexol (OMNIPAQUE) 350 MG/ML injection 100 mL (60 mLs Intravenous Contrast Given 04/04/22 2352)  LORazepam (ATIVAN) injection 1 mg (1 mg Intravenous Given 04/05/22 0101)  diatrizoate meglumine-sodium (GASTROGRAFIN) 66-10 % solution 90 mL (90 mLs Per NG tube Given 04/05/22 1409)    ED Course/ Medical Decision Making/ A&P                           Medical Decision Making Amount and/or Complexity of Data Reviewed Labs: ordered. Radiology: ordered.  Risk Prescription drug management. Decision regarding hospitalization.   D/w Dr. Thermon Leyland for admission. Didn't think there would be any immediate surgery planned, recommended [facility] admission.   Final Clinical Impression(s) / ED Diagnoses Final diagnoses:  SBO (small bowel obstruction) (HCC)  Precordial chest pain  Hypoxemia    Rx / DC Orders ED Discharge Orders     None          Shron Ozer, Corene Cornea, MD 04/05/22 [phone]

## 2022-04-05 NOTE — ED Notes (Signed)
Increased O2 to 6L because pt desat after NG placement

## 2022-04-05 NOTE — Progress Notes (Signed)
PROGRESS NOTE    [name]  KVQ:[id number] DOB: 1966/03/27 DOA: 04/04/2022 PCP: [name], MD  Outpatient Specialists:     Brief Narrative:  As per H&P done earlier today: "[name] is a 56 y.o. female with medical history  significant of  Schizo affective d/o , COPD, Anemia, GERD, HTN, gastric bypass in 2017 by Dr. Hillary Bow recent interim history of  seizure s/p fall diagnosed 8/23 for which she was started on vimpat. In addition to history of  admission 7/23-8/23 for suicide attempt complicated by aspiration pneumonia with acute respiratory failure that was vent dependent. Patient late has trach placed. During this admission patient also developed colonic ileus and was seen by GI and placed on Linzess and lactulose. Patient now presents to ED with complaints of abdominal pain with associated n/v s/p eating at funeral. Patient on ros notes last  BM was yesterday.She notes  bm since onset of abdominal pain. She denies chest pain, HA, dysuria , sob, or black stools or blood in stools.      ED Course:  Patient on evaluation found to have SBO with transition zone noted within the posterior aspect of pelvis. Surgery was consulted who noted no surgical intervention required at this time and recommended  a trial of conservative management with plans for OR if her symptoms worsen".   04/05/2022: Patient seen.  No new complaints.  NG tube to low suction.  Surgical input is appreciated.  Patient is currently being managed supportively.   Assessment & Plan:   Principal Problem: Small bowel obstruction  DVT prophylaxis: Subcu heparin Code Status: Full code Family Communication:  Disposition Plan: This will depend on hospital course   Consultants:  Surgery  Procedures:  None for now  Antimicrobials:  None   Subjective: Abdominal distention. Patient reports having broken wind.  Objective: Vitals:   04/05/22 0600 04/05/22 0905 04/05/22 1000 04/05/22 1200  BP:  (!) 164/96 (!) 161/92 129/85 (!) 146/84  Pulse: (!) 109 (!) 109 99 94  Resp: (!) 24 17 (!) 23 19  Temp:  98.8 F (37.1 C)    TempSrc:  Oral    SpO2: 99% 100% 100% 99%  Weight:      Height:       No intake or output data in the 24 hours ending 04/05/22 1342 Filed Weights   04/04/22 2152  Weight: 48.7 kg    Examination:  General exam: NG tube to low suction.  Abdominal distention.  Pulsatile neck veins Respiratory system: Clear to auscultation.  Cardiovascular system: S1 & S2 heard Gastrointestinal system: Abdomen is distended.  Bowel sounds are difficult to assess.   Central nervous system: Awake and alert.  Patient moves all extremities.   Extremities: No leg edema.  Data Reviewed: I have personally reviewed following labs and imaging studies  CBC: Recent Labs  Lab 04/04/22 2201 04/05/22 0800  WBC 8.9 7.8  NEUTROABS  --  6.1  HGB 14.7 14.1  HCT 44.6 44.6  MCV 87.6 92.3  PLT 294 643   Basic Metabolic Panel: Recent Labs  Lab 04/04/22 2201 04/05/22 0800  NA 134* 132*  K 4.4 4.0  CL 94* 98  CO2 28 26  GLUCOSE 216* 167*  BUN 9 <5*  CREATININE 0.53 0.39*  CALCIUM 9.9 8.6*   GFR: Estimated Creatinine Clearance: 55.7 mL/min (A) (by C-G formula based on SCr of 0.39 mg/dL (L)). Liver Function Tests: Recent Labs  Lab 04/04/22 2201 04/05/22 0800  AST 11* 14*  ALT 14 16  ALKPHOS 142* 128*  BILITOT 0.4 0.2*  PROT 8.6* 7.0  ALBUMIN 4.3 3.3*   Recent Labs  Lab 04/04/22 2201  LIPASE 31   No results for input(s): "AMMONIA" in the last 168 hours. Coagulation Profile: No results for input(s): "INR", "PROTIME" in the last 168 hours. Cardiac Enzymes: No results for input(s): "CKTOTAL", "CKMB", "CKMBINDEX", "TROPONINI" in the last 168 hours. BNP (last 3 results) No results for input(s): "PROBNP" in the last 8760 hours. HbA1C: Recent Labs    04/05/22 0800  HGBA1C 6.5*   CBG: Recent Labs  Lab 04/05/22 0845 04/05/22 1107  GLUCAP 160* 124*   Lipid  Profile: No results for input(s): "CHOL", "HDL", "LDLCALC", "TRIG", "CHOLHDL", "LDLDIRECT" in the last 72 hours. Thyroid Function Tests: Recent Labs    04/05/22 0800  TSH 0.544   Anemia Panel: No results for input(s): "VITAMINB12", "FOLATE", "FERRITIN", "TIBC", "IRON", "RETICCTPCT" in the last 72 hours. Urine analysis:    Component Value Date/Time   COLORURINE YELLOW 04/04/2022 2201   APPEARANCEUR CLEAR 04/04/2022 2201   LABSPEC 1.032 (H) 04/04/2022 2201   PHURINE 5.5 04/04/2022 2201   GLUCOSEU NEGATIVE 04/04/2022 2201   HGBUR NEGATIVE 04/04/2022 2201   Laurium 04/04/2022 2201   KETONESUR NEGATIVE 04/04/2022 2201   PROTEINUR 30 (A) 04/04/2022 2201   UROBILINOGEN 0.2 09/04/2013 0426   NITRITE NEGATIVE 04/04/2022 2201   LEUKOCYTESUR NEGATIVE 04/04/2022 2201   Sepsis Labs: '@LABRCNTIP'$ (procalcitonin:4,lacticidven:4)  ) Recent Results (from the past 240 hour(s))  Resp Panel by RT-PCR (Flu A&B, Covid) Anterior Nasal Swab     Status: None   Collection Time: 04/04/22 11:42 PM   Specimen: Anterior Nasal Swab  Result Value Ref Range Status   SARS Coronavirus 2 by RT PCR NEGATIVE NEGATIVE Final    Comment: (NOTE) SARS-CoV-2 target nucleic acids are NOT DETECTED.  The SARS-CoV-2 RNA is generally detectable in upper respiratory specimens during the acute phase of infection. The lowest concentration of SARS-CoV-2 viral copies this assay can detect is 138 copies/mL. A negative result does not preclude SARS-Cov-2 infection and should not be used as the sole basis for treatment or other patient management decisions. A negative result may occur with  improper specimen collection/handling, submission of specimen other than nasopharyngeal swab, presence of viral mutation(s) within the areas targeted by this assay, and inadequate number of viral copies(<138 copies/mL). A negative result must be combined with clinical observations, patient history, and  epidemiological information. The expected result is Negative.  Fact Sheet for Patients:  EntrepreneurPulse.com.au  Fact Sheet for Healthcare Providers:  IncredibleEmployment.be  This test is no t yet approved or cleared by the [place] FDA and  has been authorized for detection and/or diagnosis of SARS-CoV-2 by FDA under an Emergency Use Authorization (EUA). This EUA will remain  in effect (meaning this test can be used) for the duration of the COVID-19 declaration under Section 564(b)(1) of the Act, 21 U.S.C.section 360bbb-3(b)(1), unless the authorization is terminated  or revoked sooner.       Influenza A by PCR NEGATIVE NEGATIVE Final   Influenza B by PCR NEGATIVE NEGATIVE Final    Comment: (NOTE) The Xpert Xpress SARS-CoV-2/FLU/RSV plus assay is intended as an aid in the diagnosis of influenza from Nasopharyngeal swab specimens and should not be used as a sole basis for treatment. Nasal washings and aspirates are unacceptable for Xpert Xpress SARS-CoV-2/FLU/RSV testing.  Fact Sheet for Patients: EntrepreneurPulse.com.au  Fact Sheet for Healthcare Providers: IncredibleEmployment.be  This test is  not yet approved or cleared by the [place] and has been authorized for detection and/or diagnosis of SARS-CoV-2 by FDA under an Emergency Use Authorization (EUA). This EUA will remain in effect (meaning this test can be used) for the duration of the COVID-19 declaration under Section 564(b)(1) of the Act, 21 U.S.C. section 360bbb-3(b)(1), unless the authorization is terminated or revoked.  Performed at [facility], [address] [place], [place] [address]          Radiology Studies: DG Abd Portable 1V  Result Date: 04/05/2022 CLINICAL DATA:  NG tube placement. EXAM: PORTABLE ABDOMEN - 1 VIEW COMPARISON:  04/05/2022, earlier same day FINDINGS: NG tube is  positioned with the tip in the gastric antrum. Proximal side port well below the GE junction. Gaseous distention of small bowel visible in the upper abdomen. IMPRESSION: NG tube tip is in the gastric antrum. Electronically Signed   By: Misty Stanley M.D.   On: 04/05/2022 11:43   DG Abd Portable 1V  Result Date: 04/05/2022 CLINICAL DATA:  NG tube placement EXAM: PORTABLE ABDOMEN - 1 VIEW COMPARISON:  04/04/2022 FINDINGS: Limited radiograph of the lower chest and upper abdomen was obtained for the purposes of enteric tube localization. Enteric tube is seen coursing below the diaphragm and is coiled in the left upper quadrant with distal tip terminating near the midline, likely within the proximal small bowel. Tube appears partially kinked proximal to the side port. IMPRESSION: Enteric tube coiled in the left upper quadrant, likely terminating within the proximal small bowel. Tube appears partially kinked proximal to the side port. Electronically Signed   By: Davina Poke D.O.   On: 04/05/2022 10:03   CT ABDOMEN PELVIS W CONTRAST  Result Date: 04/05/2022 CLINICAL DATA:  Abdominal pain with nausea and vomiting. EXAM: CT ABDOMEN AND PELVIS WITH CONTRAST TECHNIQUE: Multidetector CT imaging of the abdomen and pelvis was performed using the standard protocol following bolus administration of intravenous contrast. RADIATION DOSE REDUCTION: This exam was performed according to the departmental dose-optimization program which includes automated exposure control, adjustment of the mA and/or kV according to patient size and/or use of iterative reconstruction technique. CONTRAST:  33m OMNIPAQUE IOHEXOL 350 MG/ML SOLN COMPARISON:  None Available. FINDINGS: Lower chest: No acute abnormality. Hepatobiliary: No focal liver abnormality is seen. No gallstones, gallbladder wall thickening, or biliary dilatation. Pancreas: Unremarkable. No pancreatic ductal dilatation or surrounding inflammatory changes. Spleen: Normal in  size without focal abnormality. Adrenals/Urinary Tract: Adrenal glands are unremarkable. Kidneys are normal, without renal calculi, focal lesion, or hydronephrosis. Bladder is unremarkable. Stomach/Bowel: There is a small hiatal hernia with surgical sutures noted throughout the gastric region. Surgically anastomosed bowel is also seen within the anteromedial aspect of the mid to lower left abdomen. The appendix is not clearly identified. Multiple dilated small bowel loops are seen within the left lower quadrant and left hemipelvis (maximum small bowel diameter of approximately 3.4 cm). A transition zone is noted within the posterior aspect of the pelvis (axial CT images 64 through 71, CT series 2). Mild twisting of the mesentery is suspected within the right lower quadrant and right hemipelvis (axial CT images 48 through 58, CT series 2). Vascular/Lymphatic: Aortic atherosclerosis. No enlarged abdominal or pelvic lymph nodes. Reproductive: Status post hysterectomy. No adnexal masses. Other: No abdominal wall hernia or abnormality. No abdominopelvic ascites. Musculoskeletal: No acute or significant osseous findings. IMPRESSION: 1. Small-bowel obstruction with a transition zone noted within the posterior aspect of the pelvis. 2. Mild twisting  of the mesentery within the right lower quadrant and right hemipelvis which may represent sequelae associated with an internal hernia. 3. 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 4. Small hiatal hernia. 5. Aortic atherosclerosis. Aortic Atherosclerosis (ICD10-I70.0). Electronically Signed   By: Virgina Norfolk M.D.   On: 04/05/2022 00:17   CT Angio Chest PE W and/or Wo Contrast  Result Date: 04/05/2022 CLINICAL DATA:  Decreasing oxygen saturation. EXAM: CT ANGIOGRAPHY CHEST WITH CONTRAST TECHNIQUE: Multidetector CT imaging of the chest was performed using the standard protocol during bolus administration of intravenous contrast. Multiplanar CT  image reconstructions and MIPs were obtained to evaluate the vascular anatomy. RADIATION DOSE REDUCTION: This exam was performed according to the departmental dose-optimization program which includes automated exposure control, adjustment of the mA and/or kV according to patient size and/or use of iterative reconstruction technique. CONTRAST:  82m OMNIPAQUE IOHEXOL 350 MG/ML SOLN COMPARISON:  Nov 14, 2021 FINDINGS: Cardiovascular: Satisfactory opacification of the pulmonary arteries to the segmental level. No evidence of pulmonary embolism. Normal heart size. No pericardial effusion. Mediastinum/Nodes: No enlarged mediastinal, hilar, or axillary lymph nodes. Thyroid gland, trachea, and esophagus demonstrate no significant findings. Lungs/Pleura: Lungs are clear. No pleural effusion or pneumothorax. Upper Abdomen: There is a small hiatal hernia with surgical sutures noted along the gastric region. Musculoskeletal: A chronic compression fracture deformity is seen at the level of T9. Multilevel degenerative changes are noted throughout the remainder of the thoracic spine. Review of the MIP images confirms the above findings. IMPRESSION: 1. No evidence of pulmonary embolism or other acute intrathoracic process. 2. Chronic compression fracture deformity at the level of T9. Electronically Signed   By: TVirgina NorfolkM.D.   On: 04/05/2022 00:10   DG Chest Portable 1 View  Result Date: 04/04/2022 CLINICAL DATA:  Abdominal pain. EXAM: PORTABLE CHEST 1 VIEW COMPARISON:  February 04, 2022 FINDINGS: The heart size and mediastinal contours are within normal limits. Low lung volumes are noted with mild, diffuse, chronic appearing increased lung markings. A trace amount of atelectasis is suspected within the bilateral lung bases. There is no evidence of an acute infiltrate, pleural effusion or pneumothorax. Multilevel degenerative changes are seen throughout the thoracic spine. IMPRESSION: Low lung volumes without evidence  of acute or active cardiopulmonary disease. Electronically Signed   By: TVirgina NorfolkM.D.   On: 04/04/2022 23:38        Scheduled Meds:  diatrizoate meglumine-sodium  90 mL Per NG tube Once   heparin  5,000 Units Subcutaneous Q8H   insulin aspart  0-6 Units Subcutaneous Q4H   lacosamide  50 mg Oral BID   ondansetron (ZOFRAN) IV  4 mg Intravenous Once   Continuous Infusions:  lactated ringers 100 mL/hr at 04/05/22 0917     LOS: 0 days    SDana Allan MD  [facility] Pager #: [phone] contact night coverage as above

## 2022-04-05 NOTE — ED Notes (Signed)
Two unsuccessful attempts to insert NGT --- 2nd attempt R nares epistaxis noted; easily controlled.  Pt did receive '1mg'$  IVP Ativan after 1st NGT attempt d/t high anxiety.  Attempted to now call [facility] charge nurse to

## 2022-04-06 ENCOUNTER — Inpatient Hospital Stay (HOSPITAL_COMMUNITY): Payer: Medicare Other

## 2022-04-06 DIAGNOSIS — R0602 Shortness of breath: Secondary | ICD-10-CM | POA: Diagnosis not present

## 2022-04-06 LAB — CBC WITH DIFFERENTIAL/PLATELET
Abs Immature Granulocytes: 0.03 10*3/uL (ref 0.00–0.07)
Basophils Absolute: 0 10*3/uL (ref 0.0–0.1)
Basophils Relative: 0 %
Eosinophils Absolute: 0 10*3/uL (ref 0.0–0.5)
Eosinophils Relative: 0 %
HCT: 45 % (ref 36.0–46.0)
Hemoglobin: 14.6 g/dL (ref 12.0–15.0)
Immature Granulocytes: 0 %
Lymphocytes Relative: 17 %
Lymphs Abs: 1.5 10*3/uL (ref 0.7–4.0)
MCH: 29.4 pg (ref 26.0–34.0)
MCHC: 32.4 g/dL (ref 30.0–36.0)
MCV: 90.7 fL (ref 80.0–100.0)
Monocytes Absolute: 0.6 10*3/uL (ref 0.1–1.0)
Monocytes Relative: 7 %
Neutro Abs: 6.9 10*3/uL (ref 1.7–7.7)
Neutrophils Relative %: 76 %
Platelets: 239 10*3/uL (ref 150–400)
RBC: 4.96 MIL/uL (ref 3.87–5.11)
RDW: 14.9 % (ref 11.5–15.5)
WBC: 9 10*3/uL (ref 4.0–10.5)
nRBC: 0 % (ref 0.0–0.2)

## 2022-04-06 LAB — RENAL FUNCTION PANEL
Albumin: 3.2 g/dL — ABNORMAL LOW (ref 3.5–5.0)
Anion gap: 9 (ref 5–15)
BUN: 5 mg/dL — ABNORMAL LOW (ref 6–20)
CO2: 32 mmol/L (ref 22–32)
Calcium: 9.2 mg/dL (ref 8.9–10.3)
Chloride: 98 mmol/L (ref 98–111)
Creatinine, Ser: 0.46 mg/dL (ref 0.44–1.00)
GFR, Estimated: >60 mL/min (ref >60)
Glucose, Bld: 100 mg/dL — ABNORMAL HIGH (ref 70–99)
Phosphorus: 5 mg/dL — ABNORMAL HIGH (ref 2.5–4.6)
Potassium: 3.7 mmol/L (ref 3.5–5.1)
Sodium: 139 mmol/L (ref 135–145)

## 2022-04-06 LAB — GLUCOSE, CAPILLARY
Glucose-Capillary: 107 mg/dL — ABNORMAL HIGH (ref 70–99)
Glucose-Capillary: 107 mg/dL — ABNORMAL HIGH (ref 70–99)
Glucose-Capillary: 98 mg/dL (ref 70–99)
Glucose-Capillary: 93 mg/dL (ref 70–99)
Glucose-Capillary: 95 mg/dL (ref 70–99)
Glucose-Capillary: 90 mg/dL (ref 70–99)

## 2022-04-06 MED ORDER — POTASSIUM CHLORIDE 2 MEQ/ML IV SOLN
INTRAVENOUS | Status: DC
  Filled 2022-04-06 (×9): qty 1000

## 2022-04-06 MED ORDER — LACOSAMIDE 50 MG PO TABS
50.0000 mg | ORAL_TABLET | Freq: Two times a day (BID) | ORAL | Status: DC
  Administered 2022-04-06 – 2022-04-10 (×10): 50 mg via NASOGASTRIC
  Filled 2022-04-06 (×9): qty 1

## 2022-04-06 MED ORDER — ORAL CARE MOUTH RINSE: 15.0000 mL | OROMUCOSAL | Status: DC | PRN

## 2022-04-06 NOTE — Progress Notes (Signed)
Mobility Specialist Progress Note    04/06/22 1643  Mobility  Activity Ambulated with assistance in hallway  Level of Assistance Contact guard assist, steadying assist  Assistive Device Other (Comment) (HHA)  Distance Ambulated (ft) 420 ft  Activity Response Tolerated well  Mobility Referral Yes  $Mobility charge 1 Mobility   Pre-Mobility: 108 HR, 94% SpO2 During Mobility: 112 HR Post-Mobility: 101 HR, 92% SpO2  Pt received in bed and agreeable. C/o stomach pain. Returned to bed with call bell in reach.    Hildred Alamin Mobility Specialist  Secure Chat Only

## 2022-04-06 NOTE — Progress Notes (Signed)
Progress Note     Subjective: She states abdominal pain is about the same as yesterday prior to NGT placement. No nausea/vomiting. Wants water. No flatus or BM in last 24H. Has productive cough   Objective: Vital signs in last 24 hours: Temp:  [97.7 F (36.5 C)-98.8 F (37.1 C)] 98 F (36.7 C) (10/15 0732) Pulse Rate:  [93-118] 94 (10/15 0732) Resp:  [16-25] 16 (10/15 0732) BP: (118-161)/(73-98) 134/81 (10/15 0732) SpO2:  [88 %-100 %] 96 % (10/15 0732) Weight:  [45.5 kg] 45.5 kg (10/15 0412) Last BM Date :  (PTA)  Intake/Output from previous day: 10/14 0701 - 10/15 0700 In: 1.5 [I.V.:1.5] Out: 970 [Urine:100; Emesis/NG output:120] Intake/Output this shift: No intake/output data recorded.  PE: General: pleasant, WD, female who is laying in bed in NAD Heart: regular, rate, and rhythm. Lungs: Respiratory effort nonlabored on supp O2 via [place]. Productive cough Abd: soft, mild distension. Diffuse TTP, greatest in epigastrium with voluntary guarding. NGT with scan bilious drainage in cannister MSK: all 4 extremities are symmetrical with no cyanosis, clubbing, or edema. Skin: warm and dry with no masses, lesions, or rashes Psych: A&Ox3 with an appropriate affect.    Lab Results:  Recent Labs    04/05/22 0800 04/06/22 0032  WBC 7.8 9.0  HGB 14.1 14.6  HCT 44.6 45.0  PLT 256 239   BMET Recent Labs    04/05/22 0800 04/06/22 0032  NA 132* 139  K 4.0 3.7  CL 98 98  CO2 26 32  GLUCOSE 167* 100*  BUN <5* 5*  CREATININE 0.39* 0.46  CALCIUM 8.6* 9.2   PT/INR No results for input(s): "LABPROT", "INR" in the last 72 hours. CMP     Component Value Date/Time   NA 139 04/06/2022 0032   K 3.7 04/06/2022 0032   CL 98 04/06/2022 0032   CO2 32 04/06/2022 0032   GLUCOSE 100 (H) 04/06/2022 0032   BUN 5 (L) 04/06/2022 0032   CREATININE 0.46 04/06/2022 0032   CREATININE 0.57 03/28/2021 0000   CALCIUM 9.2 04/06/2022 0032   PROT 7.0 04/05/2022 0800   ALBUMIN 3.2 (L)  04/06/2022 0032   AST 14 (L) 04/05/2022 0800   ALT 16 04/05/2022 0800   ALKPHOS 128 (H) 04/05/2022 0800   BILITOT 0.2 (L) 04/05/2022 0800   GFRNONAA >60 04/06/2022 0032   GFRAA >60 08/03/2018 1514   Lipase     Component Value Date/Time   LIPASE 31 04/04/2022 2201       Studies/Results: DG Abd Portable 1V-Small Bowel Obstruction Protocol-initial, 8 hr delay  Result Date: 04/05/2022 CLINICAL DATA:  NG tube placement EXAM: PORTABLE ABDOMEN - 1 VIEW COMPARISON:  04/05/2022 FINDINGS: Enteric tube terminates in the proximal gastric body. Dilated loops of small bowel in the central abdomen. Contrast within pelvic loops of distal small bowel. IMPRESSION: Enteric tube terminates in the proximal gastric body. Electronically Signed   By: Julian Hy M.D.   On: 04/05/2022 22:26   DG Abd Portable 1V  Result Date: 04/05/2022 CLINICAL DATA:  NG tube placement. EXAM: PORTABLE ABDOMEN - 1 VIEW COMPARISON:  04/05/2022, earlier same day FINDINGS: NG tube is positioned with the tip in the gastric antrum. Proximal side port well below the GE junction. Gaseous distention of small bowel visible in the upper abdomen. IMPRESSION: NG tube tip is in the gastric antrum. Electronically Signed   By: Misty Stanley M.D.   On: 04/05/2022 11:43   DG Abd Portable 1V  Result Date: 04/05/2022  CLINICAL DATA:  NG tube placement EXAM: PORTABLE ABDOMEN - 1 VIEW COMPARISON:  04/04/2022 FINDINGS: Limited radiograph of the lower chest and upper abdomen was obtained for the purposes of enteric tube localization. Enteric tube is seen coursing below the diaphragm and is coiled in the left upper quadrant with distal tip terminating near the midline, likely within the proximal small bowel. Tube appears partially kinked proximal to the side port. IMPRESSION: Enteric tube coiled in the left upper quadrant, likely terminating within the proximal small bowel. Tube appears partially kinked proximal to the side port. Electronically  Signed   By: Davina Poke D.O.   On: 04/05/2022 10:03   CT ABDOMEN PELVIS W CONTRAST  Result Date: 04/05/2022 CLINICAL DATA:  Abdominal pain with nausea and vomiting. EXAM: CT ABDOMEN AND PELVIS WITH CONTRAST TECHNIQUE: Multidetector CT imaging of the abdomen and pelvis was performed using the standard protocol following bolus administration of intravenous contrast. RADIATION DOSE REDUCTION: This exam was performed according to the departmental dose-optimization program which includes automated exposure control, adjustment of the mA and/or kV according to patient size and/or use of iterative reconstruction technique. CONTRAST:  4m OMNIPAQUE IOHEXOL 350 MG/ML SOLN COMPARISON:  None Available. FINDINGS: Lower chest: No acute abnormality. Hepatobiliary: No focal liver abnormality is seen. No gallstones, gallbladder wall thickening, or biliary dilatation. Pancreas: Unremarkable. No pancreatic ductal dilatation or surrounding inflammatory changes. Spleen: Normal in size without focal abnormality. Adrenals/Urinary Tract: Adrenal glands are unremarkable. Kidneys are normal, without renal calculi, focal lesion, or hydronephrosis. Bladder is unremarkable. Stomach/Bowel: There is a small hiatal hernia with surgical sutures noted throughout the gastric region. Surgically anastomosed bowel is also seen within the anteromedial aspect of the mid to lower left abdomen. The appendix is not clearly identified. Multiple dilated small bowel loops are seen within the left lower quadrant and left hemipelvis (maximum small bowel diameter of approximately 3.4 cm). A transition zone is noted within the posterior aspect of the pelvis (axial CT images 64 through 71, CT series 2). Mild twisting of the mesentery is suspected within the right lower quadrant and right hemipelvis (axial CT images 48 through 58, CT series 2). Vascular/Lymphatic: Aortic atherosclerosis. No enlarged abdominal or pelvic lymph nodes. Reproductive: Status  post hysterectomy. No adnexal masses. Other: No abdominal wall hernia or abnormality. No abdominopelvic ascites. Musculoskeletal: No acute or significant osseous findings. IMPRESSION: 1. Small-bowel obstruction with a transition zone noted within the posterior aspect of the pelvis. 2. Mild twisting of the mesentery within the right lower quadrant and right hemipelvis which may represent sequelae associated with an internal hernia. 3. 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 4. Small hiatal hernia. 5. Aortic atherosclerosis. Aortic Atherosclerosis (ICD10-I70.0). Electronically Signed   By: TVirgina NorfolkM.D.   On: 04/05/2022 00:17   CT Angio Chest PE W and/or Wo Contrast  Result Date: 04/05/2022 CLINICAL DATA:  Decreasing oxygen saturation. EXAM: CT ANGIOGRAPHY CHEST WITH CONTRAST TECHNIQUE: Multidetector CT imaging of the chest was performed using the standard protocol during bolus administration of intravenous contrast. Multiplanar CT image reconstructions and MIPs were obtained to evaluate the vascular anatomy. RADIATION DOSE REDUCTION: This exam was performed according to the departmental dose-optimization program which includes automated exposure control, adjustment of the mA and/or kV according to patient size and/or use of iterative reconstruction technique. CONTRAST:  613mOMNIPAQUE IOHEXOL 350 MG/ML SOLN COMPARISON:  Nov 14, 2021 FINDINGS: Cardiovascular: Satisfactory opacification of the pulmonary arteries to the segmental level. No evidence of pulmonary embolism. Normal heart  size. No pericardial effusion. Mediastinum/Nodes: No enlarged mediastinal, hilar, or axillary lymph nodes. Thyroid gland, trachea, and esophagus demonstrate no significant findings. Lungs/Pleura: Lungs are clear. No pleural effusion or pneumothorax. Upper Abdomen: There is a small hiatal hernia with surgical sutures noted along the gastric region. Musculoskeletal: A chronic compression fracture  deformity is seen at the level of T9. Multilevel degenerative changes are noted throughout the remainder of the thoracic spine. Review of the MIP images confirms the above findings. IMPRESSION: 1. No evidence of pulmonary embolism or other acute intrathoracic process. 2. Chronic compression fracture deformity at the level of T9. Electronically Signed   By: Virgina Norfolk M.D.   On: 04/05/2022 00:10   DG Chest Portable 1 View  Result Date: 04/04/2022 CLINICAL DATA:  Abdominal pain. EXAM: PORTABLE CHEST 1 VIEW COMPARISON:  February 04, 2022 FINDINGS: The heart size and mediastinal contours are within normal limits. Low lung volumes are noted with mild, diffuse, chronic appearing increased lung markings. A trace amount of atelectasis is suspected within the bilateral lung bases. There is no evidence of an acute infiltrate, pleural effusion or pneumothorax. Multilevel degenerative changes are seen throughout the thoracic spine. IMPRESSION: Low lung volumes without evidence of acute or active cardiopulmonary disease. Electronically Signed   By: Virgina Norfolk M.D.   On: 04/04/2022 23:38    Anti-infectives: Anti-infectives (From admission, onward)    None        Assessment/Plan  SBO H/o gastric bypass 2017 Dr. Excell Seltzer - CT w/ SBO and TP poster pelvis. Mild twisting of mesentery within RLQ. Small HH - No current indication for emergency surgery - NGT placed SBO protocol started - 8hr film with contrast in pelvic loops of SB - 120 ml from NGT documented past 24 hr but discussed with ED RN and 200 ml out with initial placement - Keep K > 4 and Mg > 2 for bowel function - Mobilize for bowel function - Hopefully patient will improve with conservative management - await bowel function and 24 hour film. However if she fails to improve or acutely worsens she may require exploratory surgery during admission - monitor abdominal exam  FEN: NPO, NGT LIWS, IVF per primary ID: none VTE: heparin  subq  Dispo: monitor abd exam. 25 hour film  I reviewed hospitalist notes, last 24 h vitals and pain scores, last 48 h intake and output, last 24 h labs and trends, and last 24 h imaging results.     LOS: 1 day   [facility] 04/06/2022, 7:54 AM Please see Amion for pager number during day hours 7:00am-4:30pm

## 2022-04-06 NOTE — Progress Notes (Signed)
PROGRESS NOTE    [name]  WJX:[id number] DOB: 12-29-1965 DOA: 04/04/2022 PCP: [name], MD  Outpatient Specialists:     Brief Narrative:  As per H&P done earlier today: "[name] is a 56 y.o. female with medical history  significant of  Schizo affective d/o , COPD, Anemia, GERD, HTN, gastric bypass in 2017 by Dr. Hillary Bow recent interim history of  seizure s/p fall diagnosed 8/23 for which she was started on vimpat. In addition to history of  admission 7/23-8/23 for suicide attempt complicated by aspiration pneumonia with acute respiratory failure that was vent dependent. Patient late has trach placed. During this admission patient also developed colonic ileus and was seen by GI and placed on Linzess and lactulose. Patient now presents to ED with complaints of abdominal pain with associated n/v s/p eating at funeral. Patient on ros notes last  BM was yesterday.She notes  bm since onset of abdominal pain. She denies chest pain, HA, dysuria , sob, or black stools or blood in stools.      ED Course:  Patient on evaluation found to have SBO with transition zone noted within the posterior aspect of pelvis. Surgery was consulted who noted no surgical intervention required at this time and recommended  a trial of conservative management with plans for OR if her symptoms worsen".   04/05/2022: Patient seen.  No new complaints.  NG tube to low suction.  Surgical input is appreciated.  Patient is currently being managed supportively.  04/06/2022: Patient seen alongside patient's nurse.  Surgical team input is highly appreciated.  Conservative management for now.  Continue NG tube to low suction.  Abdominal x-ray reviewed.  Keep potassium greater than 4.  Continue to hydrate patient.   Assessment & Plan: Principal Problem: Small bowel obstruction  Small bowel obstruction: -Continue to manage supportively. -Keep potassium greater than 4. -NG tube to low suction. -Surgery  input is appreciated. -Abdominal x-ray done today reviewed.     COPD: -no acute exacerbation  -resume home controller medications      Schizo affective d/o: -resume home regimen -Stable.    Anemia: -Resolved.    GERD -ppi     HTN -hold oral medications currently  -prn iv  -resume oral medications as able     DMII -fs /iss     Seizure d/o  -continue vimpat  -place on seizure precuations   Chronic compression fracture  -deformity at the level of T9 - supportive care   DVT prophylaxis: Subcu heparin Code Status: Full code Family Communication:  Disposition Plan: This will depend on hospital course   Consultants:  Surgery  Procedures:  None for now  Antimicrobials:  None   Subjective: Abdominal distention. No abdominal pain. Patient continues to report nausea.  Objective: Vitals:   04/06/22 0412 04/06/22 0732 04/06/22 1043 04/06/22 1500  BP:  134/81 124/80 126/80  Pulse:  94 91 94  Resp:  '16 10 18  '$ Temp:  98 F (36.7 C) 98.2 F (36.8 C) 98 F (36.7 C)  TempSrc:  Oral Oral Oral  SpO2:  96% 97% 97%  Weight: 45.5 kg     Height:        Intake/Output Summary (Last 24 hours) at 04/06/2022 1742 Last data filed at 04/06/2022 0411 Gross per 24 hour  Intake 1.5 ml  Output 220 ml  Net -218.5 ml   Filed Weights   04/04/22 2152 04/06/22 0412  Weight: 48.7 kg 45.5 kg    Examination:  General  exam: NG tube to low suction.  Abdominal distention.  Pulsatile neck veins Respiratory system: Clear to auscultation.  Cardiovascular system: S1 & S2 heard Gastrointestinal system: Abdomen is mildly distended.  Hyperactive bowel sounds.   Central nervous system: Awake and alert.  Patient moves all extremities.   Extremities: No leg edema.  Data Reviewed: I have personally reviewed following labs and imaging studies  CBC: Recent Labs  Lab 04/04/22 2201 04/05/22 0800 04/06/22 0032  WBC 8.9 7.8 9.0  NEUTROABS  --  6.1 6.9  HGB 14.7 14.1 14.6  HCT  44.6 44.6 45.0  MCV 87.6 92.3 90.7  PLT 294 256 225    Basic Metabolic Panel: Recent Labs  Lab 04/04/22 2201 04/05/22 0800 04/06/22 0032  NA 134* 132* 139  K 4.4 4.0 3.7  CL 94* 98 98  CO2 28 26 32  GLUCOSE 216* 167* 100*  BUN 9 <5* 5*  CREATININE 0.53 0.39* 0.46  CALCIUM 9.9 8.6* 9.2  PHOS  --   --  5.0*    GFR: Estimated Creatinine Clearance: 55.7 mL/min (by C-G formula based on SCr of 0.46 mg/dL). Liver Function Tests: Recent Labs  Lab 04/04/22 2201 04/05/22 0800 04/06/22 0032  AST 11* 14*  --   ALT 14 16  --   ALKPHOS 142* 128*  --   BILITOT 0.4 0.2*  --   PROT 8.6* 7.0  --   ALBUMIN 4.3 3.3* 3.2*    Recent Labs  Lab 04/04/22 2201  LIPASE 31    No results for input(s): "AMMONIA" in the last 168 hours. Coagulation Profile: No results for input(s): "INR", "PROTIME" in the last 168 hours. Cardiac Enzymes: No results for input(s): "CKTOTAL", "CKMB", "CKMBINDEX", "TROPONINI" in the last 168 hours. BNP (last 3 results) No results for input(s): "PROBNP" in the last 8760 hours. HbA1C: Recent Labs    04/05/22 0800  HGBA1C 6.5*    CBG: Recent Labs  Lab 04/05/22 2334 04/06/22 0405 04/06/22 0734 04/06/22 1108 04/06/22 1514  GLUCAP 93 107* 107* 98 93    Lipid Profile: No results for input(s): "CHOL", "HDL", "LDLCALC", "TRIG", "CHOLHDL", "LDLDIRECT" in the last 72 hours. Thyroid Function Tests: Recent Labs    04/05/22 0800  TSH 0.544    Anemia Panel: No results for input(s): "VITAMINB12", "FOLATE", "FERRITIN", "TIBC", "IRON", "RETICCTPCT" in the last 72 hours. Urine analysis:    Component Value Date/Time   COLORURINE YELLOW 04/04/2022 2201   APPEARANCEUR CLEAR 04/04/2022 2201   LABSPEC 1.032 (H) 04/04/2022 2201   PHURINE 5.5 04/04/2022 2201   GLUCOSEU NEGATIVE 04/04/2022 2201   HGBUR NEGATIVE 04/04/2022 2201   [place] 04/04/2022 2201   KETONESUR NEGATIVE 04/04/2022 2201   PROTEINUR 30 (A) 04/04/2022 2201   UROBILINOGEN 0.2  09/04/2013 0426   NITRITE NEGATIVE 04/04/2022 2201   LEUKOCYTESUR NEGATIVE 04/04/2022 2201   Sepsis Labs: '@LABRCNTIP'$ (procalcitonin:4,lacticidven:4)  ) Recent Results (from the past 240 hour(s))  Resp Panel by RT-PCR (Flu A&B, Covid) Anterior Nasal Swab     Status: None   Collection Time: 04/04/22 11:42 PM   Specimen: Anterior Nasal Swab  Result Value Ref Range Status   SARS Coronavirus 2 by RT PCR NEGATIVE NEGATIVE Final    Comment: (NOTE) SARS-CoV-2 target nucleic acids are NOT DETECTED.  The SARS-CoV-2 RNA is generally detectable in upper respiratory specimens during the acute phase of infection. The lowest concentration of SARS-CoV-2 viral copies this assay can detect is 138 copies/mL. A negative result does not preclude SARS-Cov-2 infection and should  not be used as the sole basis for treatment or other patient management decisions. A negative result may occur with  improper specimen collection/handling, submission of specimen other than nasopharyngeal swab, presence of viral mutation(s) within the areas targeted by this assay, and inadequate number of viral copies(<138 copies/mL). A negative result must be combined with clinical observations, patient history, and epidemiological information. The expected result is Negative.  Fact Sheet for Patients:  EntrepreneurPulse.com.au  Fact Sheet for Healthcare Providers:  IncredibleEmployment.be  This test is no t yet approved or cleared by the [place] FDA and  has been authorized for detection and/or diagnosis of SARS-CoV-2 by FDA under an Emergency Use Authorization (EUA). This EUA will remain  in effect (meaning this test can be used) for the duration of the COVID-19 declaration under Section 564(b)(1) of the Act, 21 U.S.C.section 360bbb-3(b)(1), unless the authorization is terminated  or revoked sooner.       Influenza A by PCR NEGATIVE NEGATIVE Final   Influenza B by PCR  NEGATIVE NEGATIVE Final    Comment: (NOTE) The Xpert Xpress SARS-CoV-2/FLU/RSV plus assay is intended as an aid in the diagnosis of influenza from Nasopharyngeal swab specimens and should not be used as a sole basis for treatment. Nasal washings and aspirates are unacceptable for Xpert Xpress SARS-CoV-2/FLU/RSV testing.  Fact Sheet for Patients: EntrepreneurPulse.com.au  Fact Sheet for Healthcare Providers: IncredibleEmployment.be  This test is not yet approved or cleared by the [place] FDA and has been authorized for detection and/or diagnosis of SARS-CoV-2 by FDA under an Emergency Use Authorization (EUA). This EUA will remain in effect (meaning this test can be used) for the duration of the COVID-19 declaration under Section 564(b)(1) of the Act, 21 U.S.C. section 360bbb-3(b)(1), unless the authorization is terminated or revoked.  Performed at [facility], [address], [place], [place] [address]          Radiology Studies: DG Abd 1 View  Result Date: 04/06/2022 CLINICAL DATA:  Small bowel obstruction. EXAM: ABDOMEN - 1 VIEW COMPARISON:  04/05/2022 FINDINGS: Diffuse gaseous small bowel dilatation persists, similar to prior. Contrast material remains within small bowel loops of the pelvis. No evidence for colonic contrast. IMPRESSION: Persistent diffuse gaseous small bowel dilatation. Contrast material remains within small bowel loops of the pelvis with no colonic contrast yet visible. Electronically Signed   By: Misty Stanley M.D.   On: 04/06/2022 12:42   DG Abd Portable 1V-Small Bowel Obstruction Protocol-initial, 8 hr delay  Result Date: 04/05/2022 CLINICAL DATA:  NG tube placement EXAM: PORTABLE ABDOMEN - 1 VIEW COMPARISON:  04/05/2022 FINDINGS: Enteric tube terminates in the proximal gastric body. Dilated loops of small bowel in the central abdomen. Contrast within pelvic loops of distal small bowel.  IMPRESSION: Enteric tube terminates in the proximal gastric body. Electronically Signed   By: Julian Hy M.D.   On: 04/05/2022 22:26   DG Abd Portable 1V  Result Date: 04/05/2022 CLINICAL DATA:  NG tube placement. EXAM: PORTABLE ABDOMEN - 1 VIEW COMPARISON:  04/05/2022, earlier same day FINDINGS: NG tube is positioned with the tip in the gastric antrum. Proximal side port well below the GE junction. Gaseous distention of small bowel visible in the upper abdomen. IMPRESSION: NG tube tip is in the gastric antrum. Electronically Signed   By: Misty Stanley M.D.   On: 04/05/2022 11:43   DG Abd Portable 1V  Result Date: 04/05/2022 CLINICAL DATA:  NG tube placement EXAM: PORTABLE ABDOMEN - 1 VIEW COMPARISON:  04/04/2022 FINDINGS: Limited radiograph of the lower chest and upper abdomen was obtained for the purposes of enteric tube localization. Enteric tube is seen coursing below the diaphragm and is coiled in the left upper quadrant with distal tip terminating near the midline, likely within the proximal small bowel. Tube appears partially kinked proximal to the side port. IMPRESSION: Enteric tube coiled in the left upper quadrant, likely terminating within the proximal small bowel. Tube appears partially kinked proximal to the side port. Electronically Signed   By: Davina Poke D.O.   On: 04/05/2022 10:03   CT ABDOMEN PELVIS W CONTRAST  Result Date: 04/05/2022 CLINICAL DATA:  Abdominal pain with nausea and vomiting. EXAM: CT ABDOMEN AND PELVIS WITH CONTRAST TECHNIQUE: Multidetector CT imaging of the abdomen and pelvis was performed using the standard protocol following bolus administration of intravenous contrast. RADIATION DOSE REDUCTION: This exam was performed according to the departmental dose-optimization program which includes automated exposure control, adjustment of the mA and/or kV according to patient size and/or use of iterative reconstruction technique. CONTRAST:  67m OMNIPAQUE  IOHEXOL 350 MG/ML SOLN COMPARISON:  None Available. FINDINGS: Lower chest: No acute abnormality. Hepatobiliary: No focal liver abnormality is seen. No gallstones, gallbladder wall thickening, or biliary dilatation. Pancreas: Unremarkable. No pancreatic ductal dilatation or surrounding inflammatory changes. Spleen: Normal in size without focal abnormality. Adrenals/Urinary Tract: Adrenal glands are unremarkable. Kidneys are normal, without renal calculi, focal lesion, or hydronephrosis. Bladder is unremarkable. Stomach/Bowel: There is a small hiatal hernia with surgical sutures noted throughout the gastric region. Surgically anastomosed bowel is also seen within the anteromedial aspect of the mid to lower left abdomen. The appendix is not clearly identified. Multiple dilated small bowel loops are seen within the left lower quadrant and left hemipelvis (maximum small bowel diameter of approximately 3.4 cm). A transition zone is noted within the posterior aspect of the pelvis (axial CT images 64 through 71, CT series 2). Mild twisting of the mesentery is suspected within the right lower quadrant and right hemipelvis (axial CT images 48 through 58, CT series 2). Vascular/Lymphatic: Aortic atherosclerosis. No enlarged abdominal or pelvic lymph nodes. Reproductive: Status post hysterectomy. No adnexal masses. Other: No abdominal wall hernia or abnormality. No abdominopelvic ascites. Musculoskeletal: No acute or significant osseous findings. IMPRESSION: 1. Small-bowel obstruction with a transition zone noted within the posterior aspect of the pelvis. 2. Mild twisting of the mesentery within the right lower quadrant and right hemipelvis which may represent sequelae associated with an internal hernia. 3. 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 4. Small hiatal hernia. 5. Aortic atherosclerosis. Aortic Atherosclerosis (ICD10-I70.0). Electronically Signed   By: TVirgina NorfolkM.D.   On: 04/05/2022  00:17   CT Angio Chest PE W and/or Wo Contrast  Result Date: 04/05/2022 CLINICAL DATA:  Decreasing oxygen saturation. EXAM: CT ANGIOGRAPHY CHEST WITH CONTRAST TECHNIQUE: Multidetector CT imaging of the chest was performed using the standard protocol during bolus administration of intravenous contrast. Multiplanar CT image reconstructions and MIPs were obtained to evaluate the vascular anatomy. RADIATION DOSE REDUCTION: This exam was performed according to the departmental dose-optimization program which includes automated exposure control, adjustment of the mA and/or kV according to patient size and/or use of iterative reconstruction technique. CONTRAST:  633mOMNIPAQUE IOHEXOL 350 MG/ML SOLN COMPARISON:  Nov 14, 2021 FINDINGS: Cardiovascular: Satisfactory opacification of the pulmonary arteries to the segmental level. No evidence of pulmonary embolism. Normal heart size. No pericardial effusion. Mediastinum/Nodes: No enlarged mediastinal, hilar, or axillary lymph nodes. Thyroid  gland, trachea, and esophagus demonstrate no significant findings. Lungs/Pleura: Lungs are clear. No pleural effusion or pneumothorax. Upper Abdomen: There is a small hiatal hernia with surgical sutures noted along the gastric region. Musculoskeletal: A chronic compression fracture deformity is seen at the level of T9. Multilevel degenerative changes are noted throughout the remainder of the thoracic spine. Review of the MIP images confirms the above findings. IMPRESSION: 1. No evidence of pulmonary embolism or other acute intrathoracic process. 2. Chronic compression fracture deformity at the level of T9. Electronically Signed   By: Virgina Norfolk M.D.   On: 04/05/2022 00:10   DG Chest Portable 1 View  Result Date: 04/04/2022 CLINICAL DATA:  Abdominal pain. EXAM: PORTABLE CHEST 1 VIEW COMPARISON:  February 04, 2022 FINDINGS: The heart size and mediastinal contours are within normal limits. Low lung volumes are noted with mild,  diffuse, chronic appearing increased lung markings. A trace amount of atelectasis is suspected within the bilateral lung bases. There is no evidence of an acute infiltrate, pleural effusion or pneumothorax. Multilevel degenerative changes are seen throughout the thoracic spine. IMPRESSION: Low lung volumes without evidence of acute or active cardiopulmonary disease. Electronically Signed   By: Virgina Norfolk M.D.   On: 04/04/2022 23:38        Scheduled Meds:  heparin  5,000 Units Subcutaneous Q8H   insulin aspart  0-6 Units Subcutaneous Q4H   lacosamide  50 mg Per NG tube BID   ondansetron (ZOFRAN) IV  4 mg Intravenous Once   Continuous Infusions:  lactated ringers 1,000 mL with potassium chloride 20 mEq infusion 100 mL/hr at 04/06/22 1340     LOS: 1 day   Time spent: 55 minutes.   Dana Allan, MD  [facility] Pager #: [phone] 7PM-7AM contact night coverage as above

## 2022-04-07 ENCOUNTER — Inpatient Hospital Stay (HOSPITAL_COMMUNITY): Payer: Medicare Other

## 2022-04-07 DIAGNOSIS — E43 Unspecified severe protein-calorie malnutrition: Secondary | ICD-10-CM

## 2022-04-07 DIAGNOSIS — K56609 Unspecified intestinal obstruction, unspecified as to partial versus complete obstruction: Secondary | ICD-10-CM

## 2022-04-07 DIAGNOSIS — K219 Gastro-esophageal reflux disease without esophagitis: Secondary | ICD-10-CM | POA: Diagnosis not present

## 2022-04-07 DIAGNOSIS — I10 Essential (primary) hypertension: Secondary | ICD-10-CM

## 2022-04-07 DIAGNOSIS — F250 Schizoaffective disorder, bipolar type: Secondary | ICD-10-CM

## 2022-04-07 DIAGNOSIS — K5900 Constipation, unspecified: Secondary | ICD-10-CM | POA: Diagnosis not present

## 2022-04-07 LAB — GLUCOSE, CAPILLARY
Glucose-Capillary: 101 mg/dL — ABNORMAL HIGH (ref 70–99)
Glucose-Capillary: 103 mg/dL — ABNORMAL HIGH (ref 70–99)
Glucose-Capillary: 115 mg/dL — ABNORMAL HIGH (ref 70–99)
Glucose-Capillary: 117 mg/dL — ABNORMAL HIGH (ref 70–99)
Glucose-Capillary: 97 mg/dL (ref 70–99)
Glucose-Capillary: 99 mg/dL (ref 70–99)

## 2022-04-07 LAB — RENAL FUNCTION PANEL
Albumin: 2.9 g/dL — ABNORMAL LOW (ref 3.5–5.0)
Anion gap: 9 (ref 5–15)
BUN: 6 mg/dL (ref 6–20)
CO2: 30 mmol/L (ref 22–32)
Calcium: 9 mg/dL (ref 8.9–10.3)
Chloride: 98 mmol/L (ref 98–111)
Creatinine, Ser: 0.56 mg/dL (ref 0.44–1.00)
GFR, Estimated: >60 mL/min (ref >60)
Glucose, Bld: 105 mg/dL — ABNORMAL HIGH (ref 70–99)
Phosphorus: 3.9 mg/dL (ref 2.5–4.6)
Potassium: 4.2 mmol/L (ref 3.5–5.1)
Sodium: 137 mmol/L (ref 135–145)

## 2022-04-07 MED ORDER — PANTOPRAZOLE SODIUM 40 MG IV SOLR
40.0000 mg | INTRAVENOUS | Status: DC
  Administered 2022-04-07 – 2022-04-08 (×2): 40 mg via INTRAVENOUS
  Filled 2022-04-07 (×2): qty 10

## 2022-04-07 MED ORDER — HYDRALAZINE HCL 20 MG/ML IJ SOLN: 10.0000 mg | Freq: Four times a day (QID) | INTRAMUSCULAR | Status: DC | PRN

## 2022-04-07 NOTE — Progress Notes (Signed)
Progress Note     Subjective: She states abdominal pain is about the same as yesterday.  Had a small BM yesterday and 1 episode of flatus today.   Objective: Vital signs in last 24 hours: Temp:  [97.8 F (36.6 C)-98.5 F (36.9 C)] 97.8 F (36.6 C) (10/16 1053) Pulse Rate:  [90-97] 90 (10/16 1053) Resp:  [13-18] 13 (10/16 1053) BP: (126-152)/(80-90) 146/86 (10/16 1053) SpO2:  [90 %-97 %] 90 % (10/16 1200) Last BM Date :  (PTA)  Intake/Output from previous day: 10/15 0701 - 10/16 0700 In: 1511.2 [I.V.:1511.2] Out: -  Intake/Output this shift: Total I/O In: 400 [I.V.:400] Out: 300 [Urine:300]  PE: General: pleasant, WD, female who is laying in bed in NAD Abd: soft, mild distension. Diffuse TTP, greatest in epigastrium with voluntary guarding. NGT with scan bilious drainage in cannister Psych: A&Ox3 with an appropriate affect.    Lab Results:  Recent Labs    04/05/22 0800 04/06/22 0032  WBC 7.8 9.0  HGB 14.1 14.6  HCT 44.6 45.0  PLT 256 239   BMET Recent Labs    04/06/22 0032 04/07/22 0010  NA 139 137  K 3.7 4.2  CL 98 98  CO2 32 30  GLUCOSE 100* 105*  BUN 5* 6  CREATININE 0.46 0.56  CALCIUM 9.2 9.0   PT/INR No results for input(s): "LABPROT", "INR" in the last 72 hours. CMP     Component Value Date/Time   NA 137 04/07/2022 0010   K 4.2 04/07/2022 0010   CL 98 04/07/2022 0010   CO2 30 04/07/2022 0010   GLUCOSE 105 (H) 04/07/2022 0010   BUN 6 04/07/2022 0010   CREATININE 0.56 04/07/2022 0010   CREATININE 0.57 03/28/2021 0000   CALCIUM 9.0 04/07/2022 0010   PROT 7.0 04/05/2022 0800   ALBUMIN 2.9 (L) 04/07/2022 0010   AST 14 (L) 04/05/2022 0800   ALT 16 04/05/2022 0800   ALKPHOS 128 (H) 04/05/2022 0800   BILITOT 0.2 (L) 04/05/2022 0800   GFRNONAA >60 04/07/2022 0010   GFRAA >60 08/03/2018 1514   Lipase     Component Value Date/Time   LIPASE 31 04/04/2022 2201       Studies/Results: DG Abd 1 View  Result Date:  04/07/2022 CLINICAL DATA:  Small bowel obstruction.  NG tube present. EXAM: ABDOMEN - 1 VIEW COMPARISON:  One-view abdomen 04/06/2022 FINDINGS: NG tube is stable position within the stomach. Persistent dilated loops of small bowel are present throughout the abdomen. Slow transit of contrast is now present in the more distal small bowel. IMPRESSION: Persistent dilated loops of small bowel throughout the abdomen with slow transit of contrast in the more distal small bowel. Findings compatible with diffuse ileus versus partial obstruction. Electronically Signed   By: San Morelle M.D.   On: 04/07/2022 07:33   DG Abd 1 View  Result Date: 04/06/2022 CLINICAL DATA:  Small bowel obstruction. EXAM: ABDOMEN - 1 VIEW COMPARISON:  04/05/2022 FINDINGS: Diffuse gaseous small bowel dilatation persists, similar to prior. Contrast material remains within small bowel loops of the pelvis. No evidence for colonic contrast. IMPRESSION: Persistent diffuse gaseous small bowel dilatation. Contrast material remains within small bowel loops of the pelvis with no colonic contrast yet visible. Electronically Signed   By: Misty Stanley M.D.   On: 04/06/2022 12:42   DG Abd Portable 1V-Small Bowel Obstruction Protocol-initial, 8 hr delay  Result Date: 04/05/2022 CLINICAL DATA:  NG tube placement EXAM: PORTABLE ABDOMEN - 1 VIEW COMPARISON:  04/05/2022 FINDINGS: Enteric tube terminates in the proximal gastric body. Dilated loops of small bowel in the central abdomen. Contrast within pelvic loops of distal small bowel. IMPRESSION: Enteric tube terminates in the proximal gastric body. Electronically Signed   By: Julian Hy M.D.   On: 04/05/2022 22:26    Anti-infectives: Anti-infectives (From admission, onward)    None        Assessment/Plan  SBO H/o gastric bypass 2017 Dr. Excell Seltzer - CT w/ SBO and TP poster pelvis. Mild twisting of mesentery within RLQ. Small HH - No current indication for emergency  surgery - NGT placed SBO protocol started - 8hr film with contrast in pelvic loops of SB and persisted on plain film today - 400cc of bilious output in cannister - Keep K > 4 and Mg > 2 for bowel function - Mobilize for bowel function - Hopefully patient will improve with conservative management - but may need surgical intervention.  FEN: NPO, NGT LIWS, IVF per primary ID: none VTE: heparin subq  I reviewed hospitalist notes, last 24 h vitals and pain scores, last 48 h intake and output, last 24 h labs and trends, and last 24 h imaging results.     LOS: 2 days   Henreitta Cea, [facility] Surgery 04/07/2022, 12:43 PM Please see Amion for pager number during day hours 7:00am-4:30pm

## 2022-04-07 NOTE — Progress Notes (Signed)
Mobility Specialist Progress Note    04/07/22 1201  Mobility  Activity Ambulated with assistance in hallway  Level of Assistance Contact guard assist, steadying assist  Assistive Device Other (Comment) (HHA)  Distance Ambulated (ft) 420 ft  Activity Response Tolerated well  Mobility Referral Yes  $Mobility charge 1 Mobility   Pre-Mobility: 91 HR, 88% SpO2 During Mobility: 110 HR, >/=87% SpO2 on RA Post-Mobility: 96 HR, 91% SpO2  Pt received in bed and agreeable. C/o thirst. Returned to bed with call bell in reach.    Hildred Alamin Mobility Specialist  Secure Chat Only

## 2022-04-07 NOTE — TOC Initial Note (Addendum)
Transition of Care [facility]) - Initial/Assessment Note    Patient Details  Name: [name] MRN: [id number] Date of Birth: 30-Mar-1966  Transition of Care [facility]) CM/SW Contact:    [name] Mayo, RN Phone Number: 04/07/2022, 12:57 PM  Clinical Narrative:                 Patient presents with SBO , has NG tube to suction, per surgery, surgery is not indicated at this time, she  is from home alone, but per her daughter , [name] who is employed here at the hospital states, even though she lives by her self she stays at her other daughters home , [place], at  [place], [place] [address].   [name] phone is [phone], she states the doctors will need to speak with her when they talk to patient because patient will not remember what they talked to her about.  Patient has home oxygen at home with [employer], 2 liters , but daughter states she does not wear the oxygen.  She also has a cane at home,  she has a pill box also, she has good family support and will have transportation at dc, she has PCS service with [name] 2.5 hrs /day.  [name] states she would like to get her another PCP.  Patient gets medications from [facility] on [address]. Daughter , [name] , states she would like for patient to have a HHRN for medication management, NCM informed MD of this information,  offered choice with Medicare.gov list, she does not have a preference.  NCM made referral to [facility] with [name] , he is able to take referral. Soc will begin 24 to 48 hrs post dc.    NCM scheduled a tele health visit with the [facility] on 10/31 at 11:15 , and then she will have a new patient establishment apt with [facility] physicians on 11/6 , listed on AVS.  Expected Discharge Plan: [name] Barriers to Discharge: Continued Medical Work up   Patient Goals and CMS Choice Patient states their goals for this hospitalization and ongoing recovery are:: return home CMS  Medicare.gov Compare Post Acute Care list provided to:: Patient Represenative (must comment) Choice offered to / list presented to : Adult Children  Expected Discharge Plan and Services Expected Discharge Plan: [name] In-house Referral: NA Discharge Planning Services: CM Consult Post Acute Care Choice: [place] arrangements for the past 2 months: Single Family Home                   DME Agency: NA       HH Arranged: RN [place] Agency: [place] Date [facility] Agency Contacted: 04/07/22 Time [place]: 1252 Representative spoke with at [place]  Prior Living Arrangements/Services Living arrangements for the past 2 months: [place] with:: Self (but goes to her daughters house all the time , live in same area) Patient language and need for interpreter reviewed:: Yes Do you feel safe going back to the place where you live?: Yes      Need for Family Participation in Patient Care: Yes (Comment) Care giver support system in place?: Yes (comment) Current home services: DME (home oxygen with [employer], cane) Criminal Activity/Legal Involvement Pertinent to Current Situation/Hospitalization: No - Comment as needed  Activities of Daily Living      Permission Sought/Granted  Emotional Assessment Appearance:: Appears stated age Attitude/Demeanor/Rapport: Engaged Affect (typically observed): Appropriate Orientation: : Oriented to Self, Oriented to Place, Oriented to  Time, Oriented to Situation Alcohol / Substance Use: Not Applicable Psych Involvement: No (comment)  Admission diagnosis:  Hypoxemia [R09.02] Precordial chest pain [R07.2] Small bowel obstruction (HCC) [K56.609] SBO (small bowel obstruction) (HCC) [K56.609] SOB (shortness of breath) [R06.02] Patient Active Problem List   Diagnosis Date Noted   SBO (small bowel obstruction) ([place]) 04/07/2022   SOB (shortness of breath) 04/05/2022    Abnormal EEG 02/12/2022   Constipation 02/12/2022   Acute urinary retention 02/12/2022   Vitamin A deficiency 02/12/2022   Vitamin D deficiency 02/12/2022   Vitamin C deficiency 02/12/2022   Iron deficiency anemia 02/12/2022   GERD (gastroesophageal reflux disease) 02/12/2022   Tobacco abuse 02/12/2022   Ileus (HCC)    Protein-calorie malnutrition, severe 01/14/2022   Acute respiratory failure with hypoxia (HCC) 11/14/2021   Anemia 11/14/2021   Polysubstance (excluding opioids) dependence, daily use ([place]) 08/04/2018   Substance induced mood disorder ([place]) 08/03/2018   Schizoaffective disorder, bipolar type ([place]) 11/13/2017   Polysubstance abuse ([place]) 11/13/2017   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   Type 2 diabetes mellitus ([place]) 09/06/2015   Essential hypertension 11/04/2011   Hypokalemia 10/02/2011   COPD (chronic obstructive pulmonary disease) ([place]) 09/29/2011   PCP:  [name], MD Pharmacy:   [place], [place] - [address] [place] [address] Phone: [phone] Fax: [phone]     Social Determinants of Health (SDOH) Interventions    Readmission Risk Interventions    04/07/2022   12:41 PM 07/27/2020   11:06 AM  Readmission Risk Prevention Plan  Transportation Screening Complete Complete  PCP or Specialist Appt within 5-7 Days  Complete  Home Care Screening  Complete  HRI or Home Care Consult Complete   Social Work Consult for Recovery Care Planning/Counseling Complete   Palliative Care Screening Not Applicable   Medication Review Press photographer) Complete

## 2022-04-07 NOTE — Progress Notes (Addendum)
PROGRESS NOTE    [name]  TKZ:[id number] DOB: 1965-06-30 DOA: 04/04/2022 PCP: [name], MD    Brief Narrative:  [name] is a 56 y.o. female with with past medical history of schizoaffective disorder, COPD, anemia, GERD, hypertension, gastric bypass in 2017, diabetes mellitus type 2 and history of seizures and suicidal attempt in the past, acute respiratory failure status post tracheostomy  presented to the hospital this time with abdominal pain, nausea, vomiting and lack of bowel movements.  Of note, patient has had colonic ileus and was seen by GI and was placed on Linzess and lactulose as outpatient.  In the ED, CT scan of the abdomen showed a small bowel obstruction with transition zone noted in the posterior aspect of the pelvis with twisting of the mesentery.  General surgery was consulted and patient was initiated on conservative treatment plan.  Patient was initiated on NG tube with suctioning.   Assessment and Plan:  Principal Problem:   SBO (small bowel obstruction) (HCC) Active Problems:   Schizoaffective disorder, bipolar type (HCC)   Constipation   Protein-calorie malnutrition, severe   Essential hypertension   GERD (gastroesophageal reflux disease)   Small bowel obstruction: On conservative treatment at this time with NG tube, IV fluids n.p.o. and electrolyte replacement.  General surgery following.  Follow-up with abdominal x-ray. Plan to keep potassium more than 4.  Continue analgesia antiemetics as needed.  SBO protocol as per general surgery.  COPD: Not on acute exacerbation.  Continue inhalers.   Schizoaffective d/o: Currently NPO.    GERD Add PPI.   Essential hypertension. Oral antihypertensives on hold.  Patient is on amlodipine and metoprolol at home.  We will add as needed hydralazine.  Latest blood pressure 152/90.  Diabetes mellitus type 2 Continue sliding scale insulin, Accu-Cheks.  Closely monitor.   Seizure disorder Continue  Vimpat through the NG tube.  Could consider IV.   Chronic compression fracture  -deformity at the level of T9. Continue supportive care.  History of cognitive decline after intentional drug overdose in the past.  Possibility of hypoxic injury back then.  Has poor memory as per the patient's daughter .    DVT prophylaxis: heparin injection 5,000 Units Start: 04/05/22 0730   Code Status:     Code Status: Full Code  Disposition: Home with home health likely.  Home health RN will be advised for medication compliance on discharge  Status is: Inpatient  Remains inpatient appropriate because: SBO undergoing conservative treatment with NG tube, IV fluids   Family Communication:  Spoke with the patient's daughter Ms. [name] at the bedside and updated her about the clinical condition of the patient.  Consultants:  General surgery  Procedures:  NG tube placement  Antimicrobials:  None  Anti-infectives (From admission, onward)    None       Subjective: Today, patient was seen and examined at bedside.  Patient denies any nausea, vomiting today but has NG tube in place.  Patient's mother and daughter at bedside.  Has not had any bowel movement or passed flatus.  Objective: Vitals:   04/06/22 1933 04/06/22 2320 04/07/22 0309 04/07/22 1053  BP:  (!) 145/83 (!) 152/90 (!) 146/86  Pulse:  93 92 90  Resp:  '15 17 13  '$ Temp: 98.5 F (36.9 C) 98.2 F (36.8 C) 98.3 F (36.8 C) 97.8 F (36.6 C)  TempSrc: Oral Axillary Oral Oral  SpO2:  95% 97% 97%  Weight:      Height:  Intake/Output Summary (Last 24 hours) at 04/07/2022 1145 Last data filed at 04/07/2022 0800 Gross per 24 hour  Intake 1511.22 ml  Output 300 ml  Net 1211.22 ml   Filed Weights   04/04/22 2152 04/06/22 0412  Weight: 48.7 kg 45.5 kg    Physical Examination: Body mass index is 19.59 kg/m.   General: Thinly built, not in obvious distress HENT:   No scleral pallor or icterus noted. Oral mucosa is  moist.  NG tube with greenish bilious fluid Chest:  Clear breath sounds.  Diminished breath sounds bilaterally. No crackles or wheezes.  CVS: S1 &S2 heard. No murmur.  Regular rate and rhythm. Abdomen: Soft, nonspecific tenderness noted, Extremities: No cyanosis, clubbing or edema.  Peripheral pulses are palpable. Psych: Alert, awake and oriented, normal mood CNS:  No cranial nerve deficits.  Power equal in all extremities.   Skin: Warm and dry.  No rashes noted.  Data Reviewed:   CBC: Recent Labs  Lab 04/04/22 2201 04/05/22 0800 04/06/22 0032  WBC 8.9 7.8 9.0  NEUTROABS  --  6.1 6.9  HGB 14.7 14.1 14.6  HCT 44.6 44.6 45.0  MCV 87.6 92.3 90.7  PLT 294 256 161    Basic Metabolic Panel: Recent Labs  Lab 04/04/22 2201 04/05/22 0800 04/06/22 0032 04/07/22 0010  NA 134* 132* 139 137  K 4.4 4.0 3.7 4.2  CL 94* 98 98 98  CO2 28 26 32 30  GLUCOSE 216* 167* 100* 105*  BUN 9 <5* 5* 6  CREATININE 0.53 0.39* 0.46 0.56  CALCIUM 9.9 8.6* 9.2 9.0  PHOS  --   --  5.0* 3.9    Liver Function Tests: Recent Labs  Lab 04/04/22 2201 04/05/22 0800 04/06/22 0032 04/07/22 0010  AST 11* 14*  --   --   ALT 14 16  --   --   ALKPHOS 142* 128*  --   --   BILITOT 0.4 0.2*  --   --   PROT 8.6* 7.0  --   --   ALBUMIN 4.3 3.3* 3.2* 2.9*     Radiology Studies: DG Abd 1 View  Result Date: 04/07/2022 CLINICAL DATA:  Small bowel obstruction.  NG tube present. EXAM: ABDOMEN - 1 VIEW COMPARISON:  One-view abdomen 04/06/2022 FINDINGS: NG tube is stable position within the stomach. Persistent dilated loops of small bowel are present throughout the abdomen. Slow transit of contrast is now present in the more distal small bowel. IMPRESSION: Persistent dilated loops of small bowel throughout the abdomen with slow transit of contrast in the more distal small bowel. Findings compatible with diffuse ileus versus partial obstruction. Electronically Signed   By: San Morelle M.D.   On:  04/07/2022 07:33   DG Abd 1 View  Result Date: 04/06/2022 CLINICAL DATA:  Small bowel obstruction. EXAM: ABDOMEN - 1 VIEW COMPARISON:  04/05/2022 FINDINGS: Diffuse gaseous small bowel dilatation persists, similar to prior. Contrast material remains within small bowel loops of the pelvis. No evidence for colonic contrast. IMPRESSION: Persistent diffuse gaseous small bowel dilatation. Contrast material remains within small bowel loops of the pelvis with no colonic contrast yet visible. Electronically Signed   By: Misty Stanley M.D.   On: 04/06/2022 12:42   DG Abd Portable 1V-Small Bowel Obstruction Protocol-initial, 8 hr delay  Result Date: 04/05/2022 CLINICAL DATA:  NG tube placement EXAM: PORTABLE ABDOMEN - 1 VIEW COMPARISON:  04/05/2022 FINDINGS: Enteric tube terminates in the proximal gastric body. Dilated loops of small bowel in the  central abdomen. Contrast within pelvic loops of distal small bowel. IMPRESSION: Enteric tube terminates in the proximal gastric body. Electronically Signed   By: Julian Hy M.D.   On: 04/05/2022 22:26      LOS: 2 days    Flora Lipps, MD [facility] Available via Epic secure chat 7am-7pm After these hours, please refer to coverage provider listed on [email or web address] 04/07/2022, 11:45 AM

## 2022-04-07 NOTE — Plan of Care (Signed)

## 2022-04-08 ENCOUNTER — Inpatient Hospital Stay (HOSPITAL_COMMUNITY): Payer: Medicare Other

## 2022-04-08 DIAGNOSIS — K219 Gastro-esophageal reflux disease without esophagitis: Secondary | ICD-10-CM | POA: Diagnosis not present

## 2022-04-08 DIAGNOSIS — K5900 Constipation, unspecified: Secondary | ICD-10-CM | POA: Diagnosis not present

## 2022-04-08 DIAGNOSIS — I10 Essential (primary) hypertension: Secondary | ICD-10-CM | POA: Diagnosis not present

## 2022-04-08 DIAGNOSIS — K56609 Unspecified intestinal obstruction, unspecified as to partial versus complete obstruction: Secondary | ICD-10-CM | POA: Diagnosis not present

## 2022-04-08 LAB — CBC
HCT: 40.3 % (ref 36.0–46.0)
Hemoglobin: 13.7 g/dL (ref 12.0–15.0)
MCH: 30.2 pg (ref 26.0–34.0)
MCHC: 34 g/dL (ref 30.0–36.0)
MCV: 88.8 fL (ref 80.0–100.0)
Platelets: 230 10*3/uL (ref 150–400)
RBC: 4.54 MIL/uL (ref 3.87–5.11)
RDW: 13.9 % (ref 11.5–15.5)
WBC: 10 10*3/uL (ref 4.0–10.5)
nRBC: 0 % (ref 0.0–0.2)

## 2022-04-08 LAB — GLUCOSE, CAPILLARY
Glucose-Capillary: 107 mg/dL — ABNORMAL HIGH (ref 70–99)
Glucose-Capillary: 108 mg/dL — ABNORMAL HIGH (ref 70–99)
Glucose-Capillary: 125 mg/dL — ABNORMAL HIGH (ref 70–99)
Glucose-Capillary: 153 mg/dL — ABNORMAL HIGH (ref 70–99)
Glucose-Capillary: 164 mg/dL — ABNORMAL HIGH (ref 70–99)
Glucose-Capillary: 188 mg/dL — ABNORMAL HIGH (ref 70–99)

## 2022-04-08 LAB — MAGNESIUM: Magnesium: 1.6 mg/dL — ABNORMAL LOW (ref 1.7–2.4)

## 2022-04-08 LAB — COMPREHENSIVE METABOLIC PANEL
ALT: 12 U/L (ref 0–44)
AST: 16 U/L (ref 15–41)
Albumin: 3 g/dL — ABNORMAL LOW (ref 3.5–5.0)
Alkaline Phosphatase: 96 U/L (ref 38–126)
Anion gap: 15 (ref 5–15)
BUN: 6 mg/dL (ref 6–20)
CO2: 25 mmol/L (ref 22–32)
Calcium: 8.6 mg/dL — ABNORMAL LOW (ref 8.9–10.3)
Chloride: 92 mmol/L — ABNORMAL LOW (ref 98–111)
Creatinine, Ser: 0.45 mg/dL (ref 0.44–1.00)
GFR, Estimated: >60 mL/min (ref >60)
Glucose, Bld: 121 mg/dL — ABNORMAL HIGH (ref 70–99)
Potassium: 4.1 mmol/L (ref 3.5–5.1)
Sodium: 132 mmol/L — ABNORMAL LOW (ref 135–145)
Total Bilirubin: 0.9 mg/dL (ref 0.3–1.2)
Total Protein: 6.6 g/dL (ref 6.5–8.1)

## 2022-04-08 MED ORDER — MAGNESIUM SULFATE 2 GM/50ML IV SOLN
2.0000 g | Freq: Once | INTRAVENOUS | Status: AC
  Administered 2022-04-08: 2 g via INTRAVENOUS
  Filled 2022-04-08: qty 50

## 2022-04-08 NOTE — TOC Progression Note (Signed)
Transition of Care [facility]) - Progression Note    Patient Details  Name: [name] MRN: [id number] Date of Birth: Jun 20, 1966  Transition of Care [facility]) CM/SW Contact  Zenon Mayo, RN Phone Number: 04/08/2022, 3:58 PM  Clinical Narrative:    Patient with SBO, clamping NG Tube, starting clears today. Will need HHRN order for medication management. She is set up with [facility] for [facility].  TOC following.   Expected Discharge Plan: [place] Barriers to Discharge: Continued Medical Work up  Expected Discharge Plan and Services Expected Discharge Plan: [place] In-house Referral: NA Discharge Planning Services: CM Consult Post Acute Care Choice: [place] arrangements for the past 2 months: Single Family Home                   DME Agency: NA       HH Arranged: RN [place] Agency: [place] Date [facility] Agency Contacted: 04/07/22 Time [place]: 1252 Representative spoke with at [place]: [place] ([place]) Interventions    Readmission Risk Interventions    04/07/2022   12:41 PM 07/27/2020   11:06 AM  Readmission Risk Prevention Plan  Transportation Screening Complete Complete  PCP or Specialist Appt within 5-7 Days  Complete  Home Care Screening  Complete  HRI or [place] Complete   Social Work Consult for [place] Planning/Counseling Complete   Palliative Care Screening Not Applicable   Medication Review Press photographer) Complete

## 2022-04-08 NOTE — Progress Notes (Addendum)
PROGRESS NOTE    [name]  WGY:[id number] DOB: [birth year] DOA: 04/04/2022 PCP: [name], MD    Brief Narrative:  [name] is a 56 y.o. female with with past medical history of schizoaffective disorder, COPD, anemia, GERD, hypertension, gastric bypass in 2017, diabetes mellitus type 2 and history of seizures  presented to the hospital this time with abdominal pain, nausea, vomiting and lack of bowel movements.  Of note, patient has had colonic ileus and was seen by GI and was placed on Linzess and lactulose as outpatient.  In the ED, CT scan of the abdomen showed a small bowel obstruction with transition zone noted in the posterior aspect of the pelvis with twisting of the mesentery.  General surgery was consulted and patient was initiated on conservative treatment plan during NG tube IV fluids.  During hospitalization, general surgery is following.Marland Kitchen  Has been having some contrast in the colon from a small bowel study.  Still got NG tube and the plan is to clamp NG tube and start some clears today.  Improving gradually.   Assessment and Plan:  Principal Problem:   Small bowel obstruction (HCC) Active Problems:   Schizoaffective disorder, bipolar type (HCC)   Constipation   Protein-calorie malnutrition, severe   Essential hypertension   GERD (gastroesophageal reflux disease)   Hypomagnesemia   Small bowel obstruction: On conservative treatment at this time with NG tube, IV fluids n.p.o. and electrolyte replacement.  General surgery following.   abdominal x-ray improved small bowel dilatation.  SBO protocol with contrast in the colon.  States that she has no nausea vomiting or and had a bowel movement..  General surgery  has recommended clears today with clamping trial of the NG tube.  COPD: Not on acute exacerbation.  Continue inhalers.   Schizoaffective d/o: To be started on clears today.  Hypomagnesemia.  We will replace through IV.  Check levels in AM.     GERD Continue PPI.   Essential hypertension.   Patient is on amlodipine and metoprolol at home.  Currently on hold.  Continue as needed hydralazine.  Blood pressure seems to be stable.  Diabetes mellitus type 2 Continue sliding scale insulin, Accu-Cheks.  Closely monitor.   Seizure disorder Continue Vimpat through    Chronic compression fracture  -deformity at the level of T9. Continue supportive care.  History of cognitive decline after intentional drug overdose in the past.  Possibility of hypoxic injury back then.  Has poor memory as per the patient's daughter    DVT prophylaxis: heparin injection 5,000 Units Start: 04/05/22 0730   Code Status:     Code Status: Full Code  Disposition:  Home plan in 1 to 2 days.  Home health RN will be advised for medication compliance on discharge  Status is: Inpatient  Remains inpatient appropriate because: SBO undergoing conservative treatment with NG tube, IV fluids,   Family Communication:  Spoke with the patient's daughter Ms. [name] at the bedside on 04/07/2022.    Consultants:  General surgery  Procedures:  NG tube placement  Antimicrobials:  None  Anti-infectives (From admission, onward)    None       Subjective: Today, patient was seen and examined at bedside.  Denies any nausea vomiting but has mild left-sided abdominal pain.  Was able to pass flatus and had bowel movements.  Feels hungry.    Objective: Vitals:   04/07/22 2343 04/08/22 0336 04/08/22 0721 04/08/22 1058  BP: (!) 145/89 (!) 143/87 138/88 134/87  Pulse: 98 100 95   Resp: '18 18 15 16  '$ Temp: 98.2 F (36.8 C) 98.7 F (37.1 C) 98.7 F (37.1 C) 98.7 F (37.1 C)  TempSrc: Oral Oral Oral Oral  SpO2: 91% 98% 97% 91%  Weight:  47.2 kg    Height:        Intake/Output Summary (Last 24 hours) at 04/08/2022 1129 Last data filed at 04/08/2022 0600 Gross per 24 hour  Intake 1613.82 ml  Output 200 ml  Net 1413.82 ml   Filed Weights   04/04/22  2152 04/06/22 0412 04/08/22 0336  Weight: 48.7 kg 45.5 kg 47.2 kg    Physical Examination: Body mass index is 20.32 kg/m.   General:  Average built, not in obvious distress HENT:   No scleral pallor or icterus noted. Oral mucosa is moist.  NG tube in place with greenish fluid. Chest:  Clear breath sounds.  Diminished breath sounds bilaterally. No crackles or wheezes.  CVS: S1 &S2 heard. No murmur.  Regular rate and rhythm. Abdomen: Soft, mild left upper abdominal tenderness on palpation, nondistended.  Bowel sounds are heard.   Extremities: No cyanosis, clubbing or edema.  Peripheral pulses are palpable. Psych: Alert, awake and oriented, normal mood CNS:  No cranial nerve deficits.  Power equal in all extremities.   Skin: Warm and dry.  No rashes noted.  Data Reviewed:   CBC: Recent Labs  Lab 04/04/22 2201 04/05/22 0800 04/06/22 0032 04/08/22 0021  WBC 8.9 7.8 9.0 10.0  NEUTROABS  --  6.1 6.9  --   HGB 14.7 14.1 14.6 13.7  HCT 44.6 44.6 45.0 40.3  MCV 87.6 92.3 90.7 88.8  PLT 294 256 239 263    Basic Metabolic Panel: Recent Labs  Lab 04/04/22 2201 04/05/22 0800 04/06/22 0032 04/07/22 0010 04/08/22 0021  NA 134* 132* 139 137 132*  K 4.4 4.0 3.7 4.2 4.1  CL 94* 98 98 98 92*  CO2 28 26 32 30 25  GLUCOSE 216* 167* 100* 105* 121*  BUN 9 <5* 5* 6 6  CREATININE 0.53 0.39* 0.46 0.56 0.45  CALCIUM 9.9 8.6* 9.2 9.0 8.6*  MG  --   --   --   --  1.6*  PHOS  --   --  5.0* 3.9  --     Liver Function Tests: Recent Labs  Lab 04/04/22 2201 04/05/22 0800 04/06/22 0032 04/07/22 0010 04/08/22 0021  AST 11* 14*  --   --  16  ALT 14 16  --   --  12  ALKPHOS 142* 128*  --   --  96  BILITOT 0.4 0.2*  --   --  0.9  PROT 8.6* 7.0  --   --  6.6  ALBUMIN 4.3 3.3* 3.2* 2.9* 3.0*     Radiology Studies: DG Abd Portable 1V  Result Date: 04/08/2022 CLINICAL DATA:  Small bowel obstruction. EXAM: PORTABLE ABDOMEN - 1 VIEW COMPARISON:  April 07, 2022. FINDINGS: Nasogastric  tube tip is seen in expected position of distal stomach. Residual contrast is noted in the nondilated colon. Slightly improved small bowel dilatation is noted consistent with ileus or obstruction. IMPRESSION: Slightly improved small bowel dilatation is noted as described above. Electronically Signed   By: Marijo Conception M.D.   On: 04/08/2022 08:27   DG Abd 1 View  Result Date: 04/07/2022 CLINICAL DATA:  Small bowel obstruction.  NG tube present. EXAM: ABDOMEN - 1 VIEW COMPARISON:  One-view abdomen 04/06/2022 FINDINGS: NG tube  is stable position within the stomach. Persistent dilated loops of small bowel are present throughout the abdomen. Slow transit of contrast is now present in the more distal small bowel. IMPRESSION: Persistent dilated loops of small bowel throughout the abdomen with slow transit of contrast in the more distal small bowel. Findings compatible with diffuse ileus versus partial obstruction. Electronically Signed   By: San Morelle M.D.   On: 04/07/2022 07:33   DG Abd 1 View  Result Date: 04/06/2022 CLINICAL DATA:  Small bowel obstruction. EXAM: ABDOMEN - 1 VIEW COMPARISON:  04/05/2022 FINDINGS: Diffuse gaseous small bowel dilatation persists, similar to prior. Contrast material remains within small bowel loops of the pelvis. No evidence for colonic contrast. IMPRESSION: Persistent diffuse gaseous small bowel dilatation. Contrast material remains within small bowel loops of the pelvis with no colonic contrast yet visible. Electronically Signed   By: Misty Stanley M.D.   On: 04/06/2022 12:42      LOS: 3 days    Flora Lipps, MD [facility] Available via Epic secure chat 7am-7pm After these hours, please refer to coverage provider listed on [email or web address] 04/08/2022, 11:29 AM

## 2022-04-08 NOTE — Plan of Care (Signed)
  Problem: Education: Goal: Knowledge of General Education information will improve Description: Including pain rating scale, medication(s)/side effects and non-pharmacologic comfort measures Outcome: Progressing   Problem: Clinical Measurements: Goal: Ability to maintain clinical measurements within normal limits will improve Outcome: Progressing Goal: Will remain free from infection Outcome: Progressing Goal: Respiratory complications will improve Outcome: Progressing Goal: Cardiovascular complication will be avoided Outcome: Progressing   Problem: Elimination: Goal: Will not experience complications related to bowel motility Outcome: Progressing

## 2022-04-08 NOTE — Care Management Important Message (Signed)
Important Message  Patient Details  Name: [name] MRN: [id number] Date of Birth: [birth year]   Medicare Important Message Given:  Yes     Intisar Claudio 04/08/2022, 2:06 PM

## 2022-04-08 NOTE — Progress Notes (Signed)
[facility] Progress Note     Subjective: CC-  Feeling better today. Abdominal pain is less. Passing flatus. She had 2 Bms yesterday and 1 today. NG with only 200cc output recorded last 24 hours. States that she feels hungry. Xray today shows contrast in colon and slightly decreased small bowel dilation.  Objective: Vital signs in last 24 hours: Temp:  [97.8 F (36.6 C)-98.7 F (37.1 C)] 98.7 F (37.1 C) (10/17 0721) Pulse Rate:  [90-100] 95 (10/17 0721) Resp:  [13-20] 15 (10/17 0721) BP: (138-162)/(85-90) 138/88 (10/17 0721) SpO2:  [90 %-98 %] 97 % (10/17 0721) Weight:  [47.2 kg] 47.2 kg (10/17 0336) Last BM Date : 04/08/22  Intake/Output from previous day: 10/16 0701 - 10/17 0700 In: 2013.8 [I.V.:2013.8] Out: 500 [Urine:300; Emesis/NG output:200] Intake/Output this shift: No intake/output data recorded.  PE: Gen:  Alert, NAD Abd: soft, mild distension, very mild LLQ tenderness without rebound or guarding  Lab Results:  Recent Labs    04/06/22 0032 04/08/22 0021  WBC 9.0 10.0  HGB 14.6 13.7  HCT 45.0 40.3  PLT 239 230   BMET Recent Labs    04/07/22 0010 04/08/22 0021  NA 137 132*  K 4.2 4.1  CL 98 92*  CO2 30 25  GLUCOSE 105* 121*  BUN 6 6  CREATININE 0.56 0.45  CALCIUM 9.0 8.6*   PT/INR No results for input(s): "LABPROT", "INR" in the last 72 hours. CMP     Component Value Date/Time   NA 132 (L) 04/08/2022 0021   K 4.1 04/08/2022 0021   CL 92 (L) 04/08/2022 0021   CO2 25 04/08/2022 0021   GLUCOSE 121 (H) 04/08/2022 0021   BUN 6 04/08/2022 0021   CREATININE 0.45 04/08/2022 0021   CREATININE 0.57 03/28/2021 0000   CALCIUM 8.6 (L) 04/08/2022 0021   PROT 6.6 04/08/2022 0021   ALBUMIN 3.0 (L) 04/08/2022 0021   AST 16 04/08/2022 0021   ALT 12 04/08/2022 0021   ALKPHOS 96 04/08/2022 0021   BILITOT 0.9 04/08/2022 0021   GFRNONAA >60 04/08/2022 0021   GFRAA >60 08/03/2018 1514   Lipase     Component Value Date/Time   LIPASE 31  04/04/2022 2201       Studies/Results: DG Abd Portable 1V  Result Date: 04/08/2022 CLINICAL DATA:  Small bowel obstruction. EXAM: PORTABLE ABDOMEN - 1 VIEW COMPARISON:  April 07, 2022. FINDINGS: Nasogastric tube tip is seen in expected position of distal stomach. Residual contrast is noted in the nondilated colon. Slightly improved small bowel dilatation is noted consistent with ileus or obstruction. IMPRESSION: Slightly improved small bowel dilatation is noted as described above. Electronically Signed   By: Marijo Conception M.D.   On: 04/08/2022 08:27   DG Abd 1 View  Result Date: 04/07/2022 CLINICAL DATA:  Small bowel obstruction.  NG tube present. EXAM: ABDOMEN - 1 VIEW COMPARISON:  One-view abdomen 04/06/2022 FINDINGS: NG tube is stable position within the stomach. Persistent dilated loops of small bowel are present throughout the abdomen. Slow transit of contrast is now present in the more distal small bowel. IMPRESSION: Persistent dilated loops of small bowel throughout the abdomen with slow transit of contrast in the more distal small bowel. Findings compatible with diffuse ileus versus partial obstruction. Electronically Signed   By: San Morelle M.D.   On: 04/07/2022 07:33   DG Abd 1 View  Result Date: 04/06/2022 CLINICAL DATA:  Small bowel obstruction. EXAM: ABDOMEN - 1 VIEW COMPARISON:  04/05/2022  FINDINGS: Diffuse gaseous small bowel dilatation persists, similar to prior. Contrast material remains within small bowel loops of the pelvis. No evidence for colonic contrast. IMPRESSION: Persistent diffuse gaseous small bowel dilatation. Contrast material remains within small bowel loops of the pelvis with no colonic contrast yet visible. Electronically Signed   By: Misty Stanley M.D.   On: 04/06/2022 12:42    Anti-infectives: Anti-infectives (From admission, onward)    None        Assessment/Plan SBO H/o gastric bypass 2017 Dr. Excell Seltzer - CT 10/13 w/ SBO and TP  poster pelvis. Mild twisting of mesentery within RLQ. Small HH - Clinically improving with return in bowel function and less abdominal pain. Xray today shows contrast in colon and slightly decreased small bowel dilation. Clamp NG and allow sips of clear liquids today. If patient complains of worsening abdominal pain or n/v then return NG to LIWS. I called and updated the patient's daughter.   FEN: NPO, clamp NG, sips of clears ID: none VTE: heparin subq   I reviewed hospitalist notes, last 24 h vitals and pain scores, last 48 h intake and output, last 24 h labs and trends, and last 24 h imaging results.    LOS: 3 days    [facility] 04/08/2022, 9:09 AM Please see Amion for pager number during day hours 7:00am-4:30pm

## 2022-04-09 DIAGNOSIS — K56609 Unspecified intestinal obstruction, unspecified as to partial versus complete obstruction: Secondary | ICD-10-CM | POA: Diagnosis not present

## 2022-04-09 DIAGNOSIS — E43 Unspecified severe protein-calorie malnutrition: Secondary | ICD-10-CM | POA: Diagnosis not present

## 2022-04-09 DIAGNOSIS — I10 Essential (primary) hypertension: Secondary | ICD-10-CM | POA: Diagnosis not present

## 2022-04-09 LAB — GLUCOSE, CAPILLARY
Glucose-Capillary: 112 mg/dL — ABNORMAL HIGH (ref 70–99)
Glucose-Capillary: 206 mg/dL — ABNORMAL HIGH (ref 70–99)
Glucose-Capillary: 138 mg/dL — ABNORMAL HIGH (ref 70–99)
Glucose-Capillary: 126 mg/dL — ABNORMAL HIGH (ref 70–99)
Glucose-Capillary: 117 mg/dL — ABNORMAL HIGH (ref 70–99)

## 2022-04-09 LAB — CBC
HCT: 41.5 % (ref 36.0–46.0)
Hemoglobin: 13.5 g/dL (ref 12.0–15.0)
MCH: 29.6 pg (ref 26.0–34.0)
MCHC: 32.5 g/dL (ref 30.0–36.0)
MCV: 91 fL (ref 80.0–100.0)
Platelets: 153 10*3/uL (ref 150–400)
RBC: 4.56 MIL/uL (ref 3.87–5.11)
RDW: 13.8 % (ref 11.5–15.5)
WBC: 5.4 10*3/uL (ref 4.0–10.5)
nRBC: 0 % (ref 0.0–0.2)

## 2022-04-09 LAB — BASIC METABOLIC PANEL
Anion gap: 12 (ref 5–15)
BUN: <5 mg/dL — ABNORMAL LOW (ref 6–20)
CO2: 29 mmol/L (ref 22–32)
Calcium: 9 mg/dL (ref 8.9–10.3)
Chloride: 95 mmol/L — ABNORMAL LOW (ref 98–111)
Creatinine, Ser: 0.34 mg/dL — ABNORMAL LOW (ref 0.44–1.00)
GFR, Estimated: >60 mL/min (ref >60)
Glucose, Bld: 120 mg/dL — ABNORMAL HIGH (ref 70–99)
Potassium: 4.6 mmol/L (ref 3.5–5.1)
Sodium: 136 mmol/L (ref 135–145)

## 2022-04-09 LAB — MAGNESIUM: Magnesium: 2 mg/dL (ref 1.7–2.4)

## 2022-04-09 MED ORDER — AMLODIPINE BESYLATE 5 MG PO TABS
5.0000 mg | ORAL_TABLET | Freq: Every day | ORAL | Status: DC
  Administered 2022-04-09 – 2022-04-10 (×2): 5 mg via ORAL
  Filled 2022-04-09 (×2): qty 1

## 2022-04-09 MED ORDER — ALBUTEROL SULFATE (2.5 MG/3ML) 0.083% IN NEBU: 3.0000 mL | INHALATION_SOLUTION | Freq: Four times a day (QID) | RESPIRATORY_TRACT | Status: DC | PRN

## 2022-04-09 MED ORDER — DOCUSATE SODIUM 100 MG PO CAPS
100.0000 mg | ORAL_CAPSULE | Freq: Two times a day (BID) | ORAL | Status: DC
  Administered 2022-04-09 – 2022-04-10 (×4): 100 mg via ORAL
  Filled 2022-04-09 (×4): qty 1

## 2022-04-09 MED ORDER — ARIPIPRAZOLE 5 MG PO TABS
5.0000 mg | ORAL_TABLET | Freq: Two times a day (BID) | ORAL | Status: DC
  Administered 2022-04-09 – 2022-04-10 (×4): 5 mg via ORAL
  Filled 2022-04-09 (×6): qty 1

## 2022-04-09 MED ORDER — IPRATROPIUM-ALBUTEROL 0.5-2.5 (3) MG/3ML IN SOLN: 3.0000 mL | Freq: Four times a day (QID) | RESPIRATORY_TRACT | Status: DC | PRN

## 2022-04-09 MED ORDER — SODIUM CHLORIDE 0.9 % IV SOLN: INTRAVENOUS | Status: AC

## 2022-04-09 MED ORDER — PAROXETINE HCL 20 MG PO TABS
20.0000 mg | ORAL_TABLET | Freq: Every morning | ORAL | Status: DC
  Administered 2022-04-09 – 2022-04-10 (×2): 20 mg via ORAL
  Filled 2022-04-09 (×3): qty 1

## 2022-04-09 MED ORDER — INSULIN ASPART 100 UNIT/ML IJ SOLN
4.0000 [IU] | Freq: Three times a day (TID) | INTRAMUSCULAR | Status: DC
  Administered 2022-04-09 – 2022-04-11 (×5): 4 [IU] via SUBCUTANEOUS

## 2022-04-09 MED ORDER — PANTOPRAZOLE SODIUM 40 MG PO TBEC
40.0000 mg | DELAYED_RELEASE_TABLET | Freq: Every day | ORAL | Status: DC
  Administered 2022-04-09 – 2022-04-10 (×2): 40 mg via ORAL
  Filled 2022-04-09 (×2): qty 1

## 2022-04-09 MED ORDER — MOMETASONE FURO-FORMOTEROL FUM 200-5 MCG/ACT IN AERO
2.0000 | INHALATION_SPRAY | Freq: Two times a day (BID) | RESPIRATORY_TRACT | Status: DC
  Administered 2022-04-09 – 2022-04-11 (×4): 2 via RESPIRATORY_TRACT
  Filled 2022-04-09: qty 8.8

## 2022-04-09 MED ORDER — QUETIAPINE FUMARATE 100 MG PO TABS
400.0000 mg | ORAL_TABLET | Freq: Every day | ORAL | Status: DC
  Administered 2022-04-09 – 2022-04-10 (×2): 400 mg via ORAL
  Filled 2022-04-09 (×2): qty 4

## 2022-04-09 MED ORDER — INSULIN ASPART 100 UNIT/ML IJ SOLN: 0.0000 [IU] | Freq: Every day | INTRAMUSCULAR | Status: DC

## 2022-04-09 MED ORDER — INSULIN ASPART 100 UNIT/ML IJ SOLN
0.0000 [IU] | Freq: Three times a day (TID) | INTRAMUSCULAR | Status: DC
  Administered 2022-04-09 (×2): 2 [IU] via SUBCUTANEOUS
  Administered 2022-04-10 – 2022-04-11 (×2): 3 [IU] via SUBCUTANEOUS

## 2022-04-09 NOTE — Progress Notes (Signed)
Mobility Specialist Progress Note    04/09/22 0939  Mobility  Activity Ambulated with assistance in hallway  Level of Assistance Contact guard assist, steadying assist  Assistive Device Other (Comment) (HHA)  Distance Ambulated (ft) 420 ft  Activity Response Tolerated well  Mobility Referral Yes  $Mobility charge 1 Mobility   Pre-Mobility: 85 HR, 127/86 BP, 95% SpO2 on 2LO2 During Mobility: 94 HR, >/=89% SpO2 on RA Post-Mobility: 96 HR, 92% SpO2  Pt received in bed and agreeable. No complaints on walk. Encouraged pursed lip breathing, tolerated on RA. Returned to sitting EOB with call bell in reach. RN advised to leave on RA.   Hildred Alamin Mobility Specialist  Secure Chat Only

## 2022-04-09 NOTE — Progress Notes (Signed)
[facility] PROGRESS NOTE    Progress Note  [name]  XLK:[id number] DOB: 08-Apr-1966 DOA: 04/04/2022 PCP: [name], MD     Brief Narrative:   [name] is an 56 y.o. female past medical history of schizoaffective disorder, essential hypertension, gastric bypass in 2017, diabetes mellitus type 2 history of seizures came in with abdominal pain nausea vomiting and no bowel movement for 3 days prior to admission.,  The patient was seen with GI as an outpatient found to have colonic ileus was started on Lasix and lactulose as an outpatient, in the ED CT scan showed small bowel obstruction with a transition point.  General surgery was consulted and recommended conservative management.    Assessment/Plan:   Small bowel obstruction ([place]) General surgery on board appreciate assistance. x-ray showed contrast in the colon. He was started on a clear liquid diet and an NG tube was discontinued. Regular bowel movements. General surgery recommended to advance diet hopefully will continue to improve. Try to keep potassium greater than 4 magnesium greater than 2. The continue fluids her potassium is 4.6 she is able to hydrate orally. Last bowel movement was yesterday, passing gas.  COPD: Continue inhalers not decompensated.  Schizoaffective disorder, bipolar type ([place]) Restart home meds.  Hypomagnesemia: Resolved, as to this morning.  GERD: Continue PPI.  Essential hypertension: Continue metoprolol and amlodipine.  Diabetes mellitus type 2: Continue sliding scale insulin while meal coverage.  Seizure disorder: Continue Vimpat.  Chronic compression fracture: Noted.  Cognitive decline: Follow-up with PCP as an outpatient.  Protein-calorie malnutrition, severe Noted.  DVT prophylaxis: heparin Family Communication:daughter Status is: Inpatient Remains inpatient appropriate because: Small bowel obstruction    Code Status:     Code Status Orders   (From admission, onward)           Start     Ordered   04/05/22 0720  Full code  Continuous        04/05/22 0721           Code Status History     Date Active Date Inactive Code Status Order ID Comments User Context   01/12/2022 0901 02/12/2022 1853 Full Code [id number]  Cristal Generous, NP ED   11/14/2021 0242 11/16/2021 1829 Full Code [id number]  Kristopher Oppenheim, DO Inpatient   11/12/2021 1508 11/13/2021 1654 Full Code [id number]  Godfrey Pick, MD ED   10/10/2020 0106 10/12/2020 0048 Full Code [id number]  Vianne Bulls, MD ED   07/24/2020 1353 07/27/2020 1641 Full Code [id number]  Lequita Halt, MD ED   08/03/2018 2350 08/06/2018 1540 Full Code [id number]  Laverle Hobby, PA-C Inpatient   08/03/2018 1619 08/03/2018 2316 Full Code [id number]  Deno Etienne, DO ED   11/13/2017 0011 11/16/2017 1716 Full Code [id number]  Rozetta Nunnery, NP Inpatient   11/12/2017 1552 11/12/2017 2331 Full Code [id number]  Long, Wonda Olds, MD ED   04/18/2016 0752 04/20/2016 0007 Full Code [id number]  Kinsinger, Arta Bruce, MD ED   04/17/2016 1039 04/18/2016 0317 Full Code [id number]  Excell Seltzer, MD HOV   03/25/2016 1223 03/27/2016 2004 Full Code [id number]  Excell Seltzer, MD Inpatient   09/28/2011 1236 10/02/2011 1828 Full Code [id number]  Brand Males, MD ED   03/04/2011 1242 03/05/2011 1941 Full Code [id number]  Bonney Aid, RN Inpatient         IV Access:   Peripheral IV   Procedures and diagnostic studies:   DG Abd Portable 1V  Result Date: 04/08/2022  CLINICAL DATA:  Small bowel obstruction. EXAM: PORTABLE ABDOMEN - 1 VIEW COMPARISON:  April 07, 2022. FINDINGS: Nasogastric tube tip is seen in expected position of distal stomach. Residual contrast is noted in the nondilated colon. Slightly improved small bowel dilatation is noted consistent with ileus or obstruction. IMPRESSION: Slightly improved small bowel dilatation is noted as described above. Electronically Signed   By: Marijo Conception M.D.   On:  04/08/2022 08:27     Medical Consultants:   None.   Subjective:    [name] no complaints this morning tolerating her diet last bowel movement yesterday passing gas  Objective:    Vitals:   04/08/22 1936 04/08/22 2356 04/09/22 0357 04/09/22 0729  BP: 139/81 (!) 144/79 (!) 149/94 134/74  Pulse: 87 75 87 95  Resp: '17 20 18 19  '$ Temp: 98.8 F (37.1 C) 98 F (36.7 C) 98 F (36.7 C) 98 F (36.7 C)  TempSrc: Oral Oral Oral Oral  SpO2: 98% 100% 96% 95%  Weight:   46.7 kg   Height:       SpO2: 95 % O2 Flow Rate (L/min): 5 L/min FiO2 (%): (S) 28 %   Intake/Output Summary (Last 24 hours) at 04/09/2022 0834 Last data filed at 04/09/2022 0730 Gross per 24 hour  Intake 2316.35 ml  Output --  Net 2316.35 ml   Filed Weights   04/06/22 0412 04/08/22 0336 04/09/22 0357  Weight: 45.5 kg 47.2 kg 46.7 kg    Exam: General exam: In no acute distress. Respiratory system: Good air movement and clear to auscultation. Cardiovascular system: S1 & S2 heard, RRR. No JVD. Gastrointestinal system: Abdomen is nondistended, soft and nontender.  Extremities: No pedal edema. Skin: No rashes, lesions or ulcers Psychiatry: Judgement and insight appear normal. Mood & affect appropriate.    Data Reviewed:    Labs: Basic Metabolic Panel: Recent Labs  Lab 04/05/22 0800 04/06/22 0032 04/07/22 0010 04/08/22 0021 04/09/22 0537  NA 132* 139 137 132* 136  K 4.0 3.7 4.2 4.1 4.6  CL 98 98 98 92* 95*  CO2 26 32 '30 25 29  '$ GLUCOSE 167* 100* 105* 121* 120*  BUN <5* 5* 6 6 <5*  CREATININE 0.39* 0.46 0.56 0.45 0.34*  CALCIUM 8.6* 9.2 9.0 8.6* 9.0  MG  --   --   --  1.6* 2.0  PHOS  --  5.0* 3.9  --   --    GFR Estimated Creatinine Clearance: 55.7 mL/min (A) (by C-G formula based on SCr of 0.34 mg/dL (L)). Liver Function Tests: Recent Labs  Lab 04/04/22 2201 04/05/22 0800 04/06/22 0032 04/07/22 0010 04/08/22 0021  AST 11* 14*  --   --  16  ALT 14 16  --   --  12  ALKPHOS  142* 128*  --   --  96  BILITOT 0.4 0.2*  --   --  0.9  PROT 8.6* 7.0  --   --  6.6  ALBUMIN 4.3 3.3* 3.2* 2.9* 3.0*   Recent Labs  Lab 04/04/22 2201  LIPASE 31   No results for input(s): "AMMONIA" in the last 168 hours. Coagulation profile No results for input(s): "INR", "PROTIME" in the last 168 hours. COVID-19 Labs  No results for input(s): "DDIMER", "FERRITIN", "LDH", "CRP" in the last 72 hours.  Lab Results  Component Value Date   SARSCOV2NAA NEGATIVE 04/04/2022   SARSCOV2NAA NEGATIVE 02/11/2022   SARSCOV2NAA NEGATIVE 11/14/2021   [place] NEGATIVE 09/05/2021    CBC:  Recent Labs  Lab 04/04/22 2201 04/05/22 0800 04/06/22 0032 04/08/22 0021 04/09/22 0537  WBC 8.9 7.8 9.0 10.0 5.4  NEUTROABS  --  6.1 6.9  --   --   HGB 14.7 14.1 14.6 13.7 13.5  HCT 44.6 44.6 45.0 40.3 41.5  MCV 87.6 92.3 90.7 88.8 91.0  PLT 294 256 239 230 153   Cardiac Enzymes: No results for input(s): "CKTOTAL", "CKMB", "CKMBINDEX", "TROPONINI" in the last 168 hours. BNP (last 3 results) No results for input(s): "PROBNP" in the last 8760 hours. CBG: Recent Labs  Lab 04/08/22 1634 04/08/22 1934 04/08/22 2358 04/09/22 0350 04/09/22 0726  GLUCAP 108* 188* 164* 112* 206*   D-Dimer: No results for input(s): "DDIMER" in the last 72 hours. Hgb A1c: No results for input(s): "HGBA1C" in the last 72 hours. Lipid Profile: No results for input(s): "CHOL", "HDL", "LDLCALC", "TRIG", "CHOLHDL", "LDLDIRECT" in the last 72 hours. Thyroid function studies: No results for input(s): "TSH", "T4TOTAL", "T3FREE", "THYROIDAB" in the last 72 hours.  Invalid input(s): "FREET3" Anemia work up: No results for input(s): "VITAMINB12", "FOLATE", "FERRITIN", "TIBC", "IRON", "RETICCTPCT" in the last 72 hours. Sepsis Labs: Recent Labs  Lab 04/05/22 0101 04/05/22 0800 04/06/22 0032 04/08/22 0021 04/09/22 0537  WBC  --  7.8 9.0 10.0 5.4  LATICACIDVEN 0.8  --   --   --   --    Microbiology Recent  Results (from the past 240 hour(s))  Resp Panel by RT-PCR (Flu A&B, Covid) Anterior Nasal Swab     Status: None   Collection Time: 04/04/22 11:42 PM   Specimen: Anterior Nasal Swab  Result Value Ref Range Status   SARS Coronavirus 2 by RT PCR NEGATIVE NEGATIVE Final    Comment: (NOTE) SARS-CoV-2 target nucleic acids are NOT DETECTED.  The SARS-CoV-2 RNA is generally detectable in upper respiratory specimens during the acute phase of infection. The lowest concentration of SARS-CoV-2 viral copies this assay can detect is 138 copies/mL. A negative result does not preclude SARS-Cov-2 infection and should not be used as the sole basis for treatment or other patient management decisions. A negative result may occur with  improper specimen collection/handling, submission of specimen other than nasopharyngeal swab, presence of viral mutation(s) within the areas targeted by this assay, and inadequate number of viral copies(<138 copies/mL). A negative result must be combined with clinical observations, patient history, and epidemiological information. The expected result is Negative.  Fact Sheet for Patients:  EntrepreneurPulse.com.au  Fact Sheet for Healthcare Providers:  IncredibleEmployment.be  This test is no t yet approved or cleared by the [place] FDA and  has been authorized for detection and/or diagnosis of SARS-CoV-2 by FDA under an Emergency Use Authorization (EUA). This EUA will remain  in effect (meaning this test can be used) for the duration of the COVID-19 declaration under Section 564(b)(1) of the Act, 21 U.S.C.section 360bbb-3(b)(1), unless the authorization is terminated  or revoked sooner.       Influenza A by PCR NEGATIVE NEGATIVE Final   Influenza B by PCR NEGATIVE NEGATIVE Final    Comment: (NOTE) The Xpert Xpress SARS-CoV-2/FLU/RSV plus assay is intended as an aid in the diagnosis of influenza from Nasopharyngeal swab  specimens and should not be used as a sole basis for treatment. Nasal washings and aspirates are unacceptable for Xpert Xpress SARS-CoV-2/FLU/RSV testing.  Fact Sheet for Patients: EntrepreneurPulse.com.au  Fact Sheet for Healthcare Providers: IncredibleEmployment.be  This test is not yet approved or cleared by the [place] FDA and has  been authorized for detection and/or diagnosis of SARS-CoV-2 by FDA under an Emergency Use Authorization (EUA). This EUA will remain in effect (meaning this test can be used) for the duration of the COVID-19 declaration under Section 564(b)(1) of the Act, 21 U.S.C. section 360bbb-3(b)(1), unless the authorization is terminated or revoked.  Performed at [facility], [place], [place] [address]      Medications:    docusate sodium  100 mg Oral BID   heparin  5,000 Units Subcutaneous Q8H   insulin aspart  0-6 Units Subcutaneous Q4H   lacosamide  50 mg Per NG tube BID   ondansetron (ZOFRAN) IV  4 mg Intravenous Once   pantoprazole (PROTONIX) IV  40 mg Intravenous Q24H   Continuous Infusions:  lactated ringers 1,000 mL with potassium chloride 20 mEq infusion 100 mL/hr at 04/09/22 0629      LOS: 4 days   Charlynne Cousins  [facility]  04/09/2022, 8:34 AM

## 2022-04-09 NOTE — Progress Notes (Signed)
04/09/22 1445  Clinical Encounter Type  Visited With Patient;Health care provider  Visit Type Initial  Referral From Physician;Nurse [name] [name] Fass, MD; [name] Dire, RN)  Consult/Referral To Chaplain [name])  Recommendations Advance Directive Education  Advance Directives (For Healthcare)  Does Patient Have a Medical Advance Directive? No  Mental Health Advance Directives  Does Patient Have a Mental Health Advance Directive? No   Chaplain responded to Spiritual Care Consultation requesting assistance for Advance Directive preparation with [name]. White. Chaplain met with [name] at patient's bedside.   [name] stated that she does NOT have a court appointed [name].  [name] stated that she IS Married. Although she is married, Patient states that she wants her Daughter: [name] [phone] as her [name].  [name] indicated that she intends to list her [name]: [name],   [phone] as her [name] in the event her daughter is unable or unwilling to serve as [name].   Chaplain provided the Advance Directive packet as well as education on Advance Directives-documents an individual completes to communicate their health care directions in advance of a time when they may need them. Chaplain informed [name] the documents which may be completed here in the hospital are the Living Will and [place].   Chaplain informed [name] is a legal document in which an individual names another person, as their [place], to communicate her health care decisions, when she is not able to communicate them for herself. The Health Care Agent's function can be temporary or permanent depending on her ability to make and  communicate those decisions independently.   Chaplain informed [name] in the absence of a [place], the state of [place] directs health care providers to look to the following individuals in the order listed: legal guardian; an attorney-in-fact under a general power of attorney (POA) if that POA includes the right to make health care decisions; person's spouse; a 24 of her adult children; a 37 of adult brothers and sisters; or an individual who has an established relationship with you, who is acting in good faith and who can convey your wishes.  If none of these persons are available or willing to make medical decisions on a patient's behalf, the law allows the patient's doctor to make decisions for them as long as another doctor agrees with those decisions.  Chaplain also informed the patient that the Health Care agent has no decision-making authority over any affairs other than those related to her medical care.   The chaplain further educated [name] that a Living Will is a legal document that allows her desires not to receive life-prolonging measures in the event that she has a condition that is incurable and will result in her death in a short period of time; they are unconscious, and doctors are confident that she will not regain consciousness; and/or she has advanced dementia or other substantial and irreversible loss of mental function.   The chaplain informed [name] that life-prolonging measures are medical treatments that would only serve to postpone death, including breathing machines, kidney dialysis, antibiotics, artificial nutrition and hydration (tube feeding), and similar forms of treatment and that if an individual is able to express  their wishes, they may also make them known without the use of a Living Will, but in the event that she is not able to express her wishes, a Living Will allows medical providers and the her family and friends to  ensure that they are not making decisions on her behalf, but rather serving as her voice to convey decisions that she has already made.   [name] is aware that the decision to create an advance directive is hers alone and she may choose not to complete the documents or may choose to complete one portion or both.  [name] was informed that she can revoke the documents at any time by striking through them and writing void or by completing new documents, but that it is also advisable that the individual verbally notify interested parties that her wishes have changed.   [name] is also aware that the document must be signed in the presence of a notary public and two witnesses and that this may be done while the patient is still admitted to the hospital or after discharge in the community. If they decide to complete Advance Directives after being discharged from the hospital, they have been advised to notify all interested parties and to provide those documents to their physicians and loved ones in addition to bringing them to the hospital in the event of another hospitalization.   The chaplain informed the [name] that if she desires to proceed with completing the Advance Directive Documentation while she is still admitted, notary services are typically available at [facility] between the hours of 1:00 and 3:30 Monday-Thursday.   When the patient is ready to have these documents completed, the patient should request that their nurse place a spiritual care consult and indicate that the patient is ready to have their advance directives notarized so that arrangements for witnesses and notary public can be made.  If there is an immediate need to have notarized please page the Chaplain.   Please page spiritual care if the patient desires further education or has questions.   [phone] [place], M. Min., [phone].

## 2022-04-09 NOTE — Progress Notes (Signed)
[facility] Progress Note     Subjective: CC-  States that she gets a little bloated after PO intake but it resolves with time. Denies any nausea or vomiting. Feels hungry. Passing flatus and had BM x2 yesterday.  Objective: Vital signs in last 24 hours: Temp:  [98 F (36.7 C)-98.8 F (37.1 C)] 98 F (36.7 C) (10/18 0729) Pulse Rate:  [75-95] 95 (10/18 0729) Resp:  [16-20] 19 (10/18 0729) BP: (133-149)/(74-94) 134/74 (10/18 0729) SpO2:  [91 %-100 %] 95 % (10/18 0729) Weight:  [46.7 kg] 46.7 kg (10/18 0357) Last BM Date : 04/08/22  Intake/Output from previous day: 10/17 0701 - 10/18 0700 In: 1956.4 [P.O.:340; I.V.:1616.4] Out: -  Intake/Output this shift: Total I/O In: 360 [P.O.:360] Out: -   PE:  Gen:  Alert, NAD Abd: some increased distension from yesterday but soft, nontender, tinkling bowel sounds  Lab Results:  Recent Labs    04/08/22 0021 04/09/22 0537  WBC 10.0 5.4  HGB 13.7 13.5  HCT 40.3 41.5  PLT 230 153   BMET Recent Labs    04/08/22 0021 04/09/22 0537  NA 132* 136  K 4.1 4.6  CL 92* 95*  CO2 25 29  GLUCOSE 121* 120*  BUN 6 <5*  CREATININE 0.45 0.34*  CALCIUM 8.6* 9.0   PT/INR No results for input(s): "LABPROT", "INR" in the last 72 hours. CMP     Component Value Date/Time   NA 136 04/09/2022 0537   K 4.6 04/09/2022 0537   CL 95 (L) 04/09/2022 0537   CO2 29 04/09/2022 0537   GLUCOSE 120 (H) 04/09/2022 0537   BUN <5 (L) 04/09/2022 0537   CREATININE 0.34 (L) 04/09/2022 0537   CREATININE 0.57 03/28/2021 0000   CALCIUM 9.0 04/09/2022 0537   PROT 6.6 04/08/2022 0021   ALBUMIN 3.0 (L) 04/08/2022 0021   AST 16 04/08/2022 0021   ALT 12 04/08/2022 0021   ALKPHOS 96 04/08/2022 0021   BILITOT 0.9 04/08/2022 0021   GFRNONAA >60 04/09/2022 0537   GFRAA >60 08/03/2018 1514   Lipase     Component Value Date/Time   LIPASE 31 04/04/2022 2201       Studies/Results: DG Abd Portable 1V  Result Date: 04/08/2022 CLINICAL  DATA:  Small bowel obstruction. EXAM: PORTABLE ABDOMEN - 1 VIEW COMPARISON:  April 07, 2022. FINDINGS: Nasogastric tube tip is seen in expected position of distal stomach. Residual contrast is noted in the nondilated colon. Slightly improved small bowel dilatation is noted consistent with ileus or obstruction. IMPRESSION: Slightly improved small bowel dilatation is noted as described above. Electronically Signed   By: Marijo Conception M.D.   On: 04/08/2022 08:27    Anti-infectives: Anti-infectives (From admission, onward)    None        Assessment/Plan SBO H/o gastric bypass 2017 Dr. Excell Seltzer - CT 10/13 w/ SBO and TP poster pelvis. Mild twisting of mesentery within RLQ. Small HH - NG out yesterday, patient tolerating clears and continues to have bowel function. She is a little more distended today but nontender and having no n/v. Will advance to bariatric full liquids and monitor. Mobilize.    FEN: bariatric FLD ID: none VTE: heparin subq   I reviewed hospitalist notes, last 24 h vitals and pain scores, last 48 h intake and output, last 24 h labs and trends, and last 24 h imaging results.    LOS: 4 days    [place]ellington [place], [facility] 04/09/2022, 7:56 AM  Please see Amion for pager number during day hours 7:00am-4:30pm

## 2022-04-10 DIAGNOSIS — I10 Essential (primary) hypertension: Secondary | ICD-10-CM | POA: Diagnosis not present

## 2022-04-10 DIAGNOSIS — E43 Unspecified severe protein-calorie malnutrition: Secondary | ICD-10-CM | POA: Diagnosis not present

## 2022-04-10 DIAGNOSIS — K56609 Unspecified intestinal obstruction, unspecified as to partial versus complete obstruction: Secondary | ICD-10-CM | POA: Diagnosis not present

## 2022-04-10 LAB — GLUCOSE, CAPILLARY
Glucose-Capillary: 159 mg/dL — ABNORMAL HIGH (ref 70–99)
Glucose-Capillary: 98 mg/dL (ref 70–99)
Glucose-Capillary: 80 mg/dL (ref 70–99)
Glucose-Capillary: 131 mg/dL — ABNORMAL HIGH (ref 70–99)

## 2022-04-10 NOTE — Progress Notes (Signed)
Mobility Specialist Progress Note    04/10/22 0905  Mobility  Activity Ambulated with assistance in hallway  Level of Assistance Contact guard assist, steadying assist  Assistive Device Other (Comment) (HHA)  Distance Ambulated (ft) 420 ft  Activity Response Tolerated well  Mobility Referral Yes  $Mobility charge 1 Mobility   Pre-Mobility: 91 HR, 95% SpO2 on 2LO2 During Mobility: 110 HR, 92% SpO2 on RA Post-Mobility: 101 HR, 92% SpO2 on RA  Pt received in bed and agreeable. C/o some lightheadedness. Tolerated on RA. Maintaining SpO2 around 92%. Returned to sitting EOB with call bell in reach. Left on RA.  Hildred Alamin Mobility Specialist  Secure Chat Only

## 2022-04-10 NOTE — Progress Notes (Signed)
[facility] PROGRESS NOTE    Progress Note  [name]  TIW:[id number] DOB: Nov 13, 1965 DOA: 04/04/2022 PCP: [name], MD     Brief Narrative:   [name] is an 56 y.o. female past medical history of schizoaffective disorder, essential hypertension, gastric bypass in 2017, diabetes mellitus type 2 history of seizures came in with abdominal pain nausea vomiting and no bowel movement for 3 days prior to admission.,  The patient was seen with GI as an outpatient found to have colonic ileus was started on Lasix and lactulose as an outpatient, in the ED CT scan showed small bowel obstruction with a transition point.  General surgery was consulted and recommended conservative management.    Assessment/Plan:   Small bowel obstruction ([place]) General surgery on board appreciate assistance. x-ray showed contrast in the colon. NG tube has been discontinued, currently on full liquid diet. Try to keep potassium greater than 4 magnesium greater than 2. Surgery to dictate when to advance diet. Diminished still distended, she relates she is passing gas had 2 bowel movements yesterday.  COPD: Continue inhalers not decompensated.  Schizoaffective disorder, bipolar type ([place]) Restart home meds.  Hypomagnesemia: Resolved, as to this morning.  GERD: Continue PPI.  Essential hypertension: Continue metoprolol and amlodipine.  Diabetes mellitus type 2: Continue sliding scale insulin while meal coverage.  Seizure disorder: Continue Vimpat.  Chronic compression fracture: Noted.  Cognitive decline: Follow-up with PCP as an outpatient.  Protein-calorie malnutrition, severe Noted.  DVT prophylaxis: heparin Family Communication:daughter Status is: Inpatient Remains inpatient appropriate because: Small bowel obstruction    Code Status:     Code Status Orders  (From admission, onward)           Start     Ordered   04/05/22 0720  Full code  Continuous         04/05/22 0721           Code Status History     Date Active Date Inactive Code Status Order ID Comments User Context   01/12/2022 0901 02/12/2022 1853 Full Code [id number]  Cristal Generous, NP ED   11/14/2021 0242 11/16/2021 1829 Full Code [id number]  Kristopher Oppenheim, DO Inpatient   11/12/2021 1508 11/13/2021 1654 Full Code [id number]  Godfrey Pick, MD ED   10/10/2020 0106 10/12/2020 0048 Full Code [id number]  Vianne Bulls, MD ED   07/24/2020 1353 07/27/2020 1641 Full Code [id number]  Lequita Halt, MD ED   08/03/2018 2350 08/06/2018 1540 Full Code [id number]  Laverle Hobby, PA-C Inpatient   08/03/2018 1619 08/03/2018 2316 Full Code [id number]  Deno Etienne, DO ED   11/13/2017 0011 11/16/2017 1716 Full Code [id number]  Rozetta Nunnery, NP Inpatient   11/12/2017 1552 11/12/2017 2331 Full Code [id number]  Long, Wonda Olds, MD ED   04/18/2016 0752 04/20/2016 0007 Full Code [id number]  Kinsinger, Arta Bruce, MD ED   04/17/2016 1039 04/18/2016 0317 Full Code [id number]  Excell Seltzer, MD HOV   03/25/2016 1223 03/27/2016 2004 Full Code [id number]  Excell Seltzer, MD Inpatient   09/28/2011 1236 10/02/2011 1828 Full Code [id number]  Brand Males, MD ED   03/04/2011 1242 03/05/2011 1941 Full Code [id number]  Bonney Aid, RN Inpatient         IV Access:   Peripheral IV   Procedures and diagnostic studies:   DG Abd Portable 1V  Result Date: 04/08/2022 CLINICAL DATA:  Small bowel obstruction. EXAM: PORTABLE ABDOMEN - 1 VIEW COMPARISON:  April 07, 2022.  FINDINGS: Nasogastric tube tip is seen in expected position of distal stomach. Residual contrast is noted in the nondilated colon. Slightly improved small bowel dilatation is noted consistent with ileus or obstruction. IMPRESSION: Slightly improved small bowel dilatation is noted as described above. Electronically Signed   By: Marijo Conception M.D.   On: 04/08/2022 08:27     Medical Consultants:   None.   Subjective:    [name] past gas having  bowel movements.  Objective:    Vitals:   04/09/22 1625 04/09/22 1950 04/09/22 2324 04/10/22 0315  BP: 119/76 126/78 113/74 122/78  Pulse: 77 76 88 84  Resp: '17 18 18 '$ (!) 21  Temp: 98 F (36.7 C) 98.8 F (37.1 C) 98.8 F (37.1 C)   TempSrc: Oral Oral Oral   SpO2: 97%     Weight:      Height:       SpO2: 97 % O2 Flow Rate (L/min): 2 L/min FiO2 (%): (S) 28 %   Intake/Output Summary (Last 24 hours) at 04/10/2022 0737 Last data filed at 04/09/2022 1500 Gross per 24 hour  Intake 781.7 ml  Output --  Net 781.7 ml    Filed Weights   04/06/22 0412 04/08/22 0336 04/09/22 0357  Weight: 45.5 kg 47.2 kg 46.7 kg    Exam: General exam: In no acute distress. Respiratory system: Good air movement and clear to auscultation. Cardiovascular system: S1 & S2 heard, RRR. No JVD. Gastrointestinal system: Abdomen is nondistended, soft and nontender.  Extremities: No pedal edema. Skin: No rashes, lesions or ulcers Psychiatry: Judgement and insight appear normal. Mood & affect appropriate.   Data Reviewed:    Labs: Basic Metabolic Panel: Recent Labs  Lab 04/05/22 0800 04/06/22 0032 04/07/22 0010 04/08/22 0021 04/09/22 0537  NA 132* 139 137 132* 136  K 4.0 3.7 4.2 4.1 4.6  CL 98 98 98 92* 95*  CO2 26 32 '30 25 29  '$ GLUCOSE 167* 100* 105* 121* 120*  BUN <5* 5* 6 6 <5*  CREATININE 0.39* 0.46 0.56 0.45 0.34*  CALCIUM 8.6* 9.2 9.0 8.6* 9.0  MG  --   --   --  1.6* 2.0  PHOS  --  5.0* 3.9  --   --     GFR Estimated Creatinine Clearance: 55.7 mL/min (A) (by C-G formula based on SCr of 0.34 mg/dL (L)). Liver Function Tests: Recent Labs  Lab 04/04/22 2201 04/05/22 0800 04/06/22 0032 04/07/22 0010 04/08/22 0021  AST 11* 14*  --   --  16  ALT 14 16  --   --  12  ALKPHOS 142* 128*  --   --  96  BILITOT 0.4 0.2*  --   --  0.9  PROT 8.6* 7.0  --   --  6.6  ALBUMIN 4.3 3.3* 3.2* 2.9* 3.0*    Recent Labs  Lab 04/04/22 2201  LIPASE 31    No results for input(s):  "AMMONIA" in the last 168 hours. Coagulation profile No results for input(s): "INR", "PROTIME" in the last 168 hours. COVID-19 Labs  No results for input(s): "DDIMER", "FERRITIN", "LDH", "CRP" in the last 72 hours.  Lab Results  Component Value Date   SARSCOV2NAA NEGATIVE 04/04/2022   SARSCOV2NAA NEGATIVE 02/11/2022   SARSCOV2NAA NEGATIVE 11/14/2021   [place] NEGATIVE 09/05/2021    CBC: Recent Labs  Lab 04/04/22 2201 04/05/22 0800 04/06/22 0032 04/08/22 0021 04/09/22 0537  WBC 8.9 7.8 9.0 10.0 5.4  NEUTROABS  --  6.1  6.9  --   --   HGB 14.7 14.1 14.6 13.7 13.5  HCT 44.6 44.6 45.0 40.3 41.5  MCV 87.6 92.3 90.7 88.8 91.0  PLT 294 256 239 230 153    Cardiac Enzymes: No results for input(s): "CKTOTAL", "CKMB", "CKMBINDEX", "TROPONINI" in the last 168 hours. BNP (last 3 results) No results for input(s): "PROBNP" in the last 8760 hours. CBG: Recent Labs  Lab 04/09/22 0350 04/09/22 0726 04/09/22 1122 04/09/22 1622 04/09/22 2009  GLUCAP 112* 206* 138* 126* 117*    D-Dimer: No results for input(s): "DDIMER" in the last 72 hours. Hgb A1c: No results for input(s): "HGBA1C" in the last 72 hours. Lipid Profile: No results for input(s): "CHOL", "HDL", "LDLCALC", "TRIG", "CHOLHDL", "LDLDIRECT" in the last 72 hours. Thyroid function studies: No results for input(s): "TSH", "T4TOTAL", "T3FREE", "THYROIDAB" in the last 72 hours.  Invalid input(s): "FREET3" Anemia work up: No results for input(s): "VITAMINB12", "FOLATE", "FERRITIN", "TIBC", "IRON", "RETICCTPCT" in the last 72 hours. Sepsis Labs: Recent Labs  Lab 04/05/22 0101 04/05/22 0800 04/06/22 0032 04/08/22 0021 04/09/22 0537  WBC  --  7.8 9.0 10.0 5.4  LATICACIDVEN 0.8  --   --   --   --     Microbiology Recent Results (from the past 240 hour(s))  Resp Panel by RT-PCR (Flu A&B, Covid) Anterior Nasal Swab     Status: None   Collection Time: 04/04/22 11:42 PM   Specimen: Anterior Nasal Swab  Result  Value Ref Range Status   SARS Coronavirus 2 by RT PCR NEGATIVE NEGATIVE Final    Comment: (NOTE) SARS-CoV-2 target nucleic acids are NOT DETECTED.  The SARS-CoV-2 RNA is generally detectable in upper respiratory specimens during the acute phase of infection. The lowest concentration of SARS-CoV-2 viral copies this assay can detect is 138 copies/mL. A negative result does not preclude SARS-Cov-2 infection and should not be used as the sole basis for treatment or other patient management decisions. A negative result may occur with  improper specimen collection/handling, submission of specimen other than nasopharyngeal swab, presence of viral mutation(s) within the areas targeted by this assay, and inadequate number of viral copies(<138 copies/mL). A negative result must be combined with clinical observations, patient history, and epidemiological information. The expected result is Negative.  Fact Sheet for Patients:  EntrepreneurPulse.com.au  Fact Sheet for Healthcare Providers:  IncredibleEmployment.be  This test is no t yet approved or cleared by the [place] FDA and  has been authorized for detection and/or diagnosis of SARS-CoV-2 by FDA under an Emergency Use Authorization (EUA). This EUA will remain  in effect (meaning this test can be used) for the duration of the COVID-19 declaration under Section 564(b)(1) of the Act, 21 U.S.C.section 360bbb-3(b)(1), unless the authorization is terminated  or revoked sooner.       Influenza A by PCR NEGATIVE NEGATIVE Final   Influenza B by PCR NEGATIVE NEGATIVE Final    Comment: (NOTE) The Xpert Xpress SARS-CoV-2/FLU/RSV plus assay is intended as an aid in the diagnosis of influenza from Nasopharyngeal swab specimens and should not be used as a sole basis for treatment. Nasal washings and aspirates are unacceptable for Xpert Xpress SARS-CoV-2/FLU/RSV testing.  Fact Sheet for  Patients: EntrepreneurPulse.com.au  Fact Sheet for Healthcare Providers: IncredibleEmployment.be  This test is not yet approved or cleared by the [place] FDA and has been authorized for detection and/or diagnosis of SARS-CoV-2 by FDA under an Emergency Use Authorization (EUA). This EUA will remain in effect (meaning this  test can be used) for the duration of the COVID-19 declaration under Section 564(b)(1) of the Act, 21 U.S.C. section 360bbb-3(b)(1), unless the authorization is terminated or revoked.  Performed at [facility], [address] [place], [place] [address]      Medications:    amLODipine  5 mg Oral Daily   ARIPiprazole  5 mg Oral BID   docusate sodium  100 mg Oral BID   heparin  5,000 Units Subcutaneous Q8H   insulin aspart  0-15 Units Subcutaneous TID WC   insulin aspart  0-5 Units Subcutaneous QHS   insulin aspart  4 Units Subcutaneous TID WC   lacosamide  50 mg Per NG tube BID   mometasone-formoterol  2 puff Inhalation BID   ondansetron (ZOFRAN) IV  4 mg Intravenous Once   pantoprazole  40 mg Oral Daily   PARoxetine  20 mg Oral q morning   QUEtiapine  400 mg Oral QHS   Continuous Infusions:  sodium chloride 75 mL/hr at 04/09/22 1408      LOS: 5 days   Charlynne Cousins  [facility]  04/10/2022, 7:37 AM

## 2022-04-10 NOTE — Progress Notes (Signed)
Mobility Specialist Progress Note    04/10/22 1623  Mobility  Activity Ambulated with assistance in hallway  Level of Assistance Contact guard assist, steadying assist  Assistive Device Other (Comment) (HHA)  Distance Ambulated (ft) 420 ft  Activity Response Tolerated well  Mobility Referral Yes  $Mobility charge 1 Mobility   Pre-Mobility: 86 HR, 95% SpO2 on 2LO2 During Mobility: 105 HR, 88% SpO2 on RA Post-Mobility: 93 HR, 90% SpO2 on 2LO2  Pt received in bed and agreeable. C/o some lightheadedness. Returned to bed with call bell in reach.    Hildred Alamin Mobility Specialist  Secure Chat Only

## 2022-04-10 NOTE — Progress Notes (Signed)
2215: Patient very agitated and wanted to go to the bathroom with the telemetry leads attached to the monitor. This RN advised the patient to use the [facility] which was set up besides her bed and which she was using all day. Patient adamant to go to the bathroom. This RN tried to remove the telemetry leads from the patient but the patient tried to swing the leads towards this RN. Patient wanted to leave the hospital and started going through her personal belongings. This RN asked if she could call someone from her family she could talk to, the patient started getting verbally abusive. This RN paged the doctor and made the doctor aware of the situation. Patient talking to one of her family members on her personal phone.   2230; Patient's daughter [name] called the unit. This RN talked to her and made her aware of the situation. Daughter who is POA concerned about her mother and asking to get updates from the doctor. The daughter talked to the patient and tried to calm her down.  2248: This RN talked to the on-call MD and made the MD aware of the agitation and family trying to calm her down through the phone. RN to update the MD if the patient does not calm down. MD also made aware about the daughter wanting to talk to the doctor for updates.  2315: This RN went back in the patient room, patient lying in bed comfortable and alert and oriented. Patient no more agitated. Discontinued the cardiac monitor as there was previous order to discontinue it.

## 2022-04-10 NOTE — Progress Notes (Signed)
[facility] Progress Note     Subjective: CC-  Still somewhat distended but denies abdominal pain, nausea, or vomiting. She feels like bloating is improving. Taking in a lot of liquids. She had a few loose stools yesterday and one so far this morning. Passing some flatus. Ambulated in the hall yesterday. Feeling hungry.  Objective: Vital signs in last 24 hours: Temp:  [98 F (36.7 C)-98.8 F (37.1 C)] 98.4 F (36.9 C) (10/19 0802) Pulse Rate:  [76-92] 84 (10/19 0913) Resp:  [14-26] 26 (10/19 0913) BP: (113-134)/(74-86) 121/79 (10/19 0913) SpO2:  [93 %-97 %] 93 % (10/19 0913) FiO2 (%):  [95 %] 95 % (10/19 0853) Last BM Date : 04/09/22  Intake/Output from previous day: 10/18 0701 - 10/19 0700 In: 1141.7 [P.O.:1080; I.V.:61.7] Out: -  Intake/Output this shift: Total I/O In: 240 [P.O.:240] Out: -   PE: Gen:  Alert, NAD Abd: distended but soft, nontender, tinkling bowel sounds  Lab Results:  Recent Labs    04/08/22 0021 04/09/22 0537  WBC 10.0 5.4  HGB 13.7 13.5  HCT 40.3 41.5  PLT 230 153   BMET Recent Labs    04/08/22 0021 04/09/22 0537  NA 132* 136  K 4.1 4.6  CL 92* 95*  CO2 25 29  GLUCOSE 121* 120*  BUN 6 <5*  CREATININE 0.45 0.34*  CALCIUM 8.6* 9.0   PT/INR No results for input(s): "LABPROT", "INR" in the last 72 hours. CMP     Component Value Date/Time   NA 136 04/09/2022 0537   K 4.6 04/09/2022 0537   CL 95 (L) 04/09/2022 0537   CO2 29 04/09/2022 0537   GLUCOSE 120 (H) 04/09/2022 0537   BUN <5 (L) 04/09/2022 0537   CREATININE 0.34 (L) 04/09/2022 0537   CREATININE 0.57 03/28/2021 0000   CALCIUM 9.0 04/09/2022 0537   PROT 6.6 04/08/2022 0021   ALBUMIN 3.0 (L) 04/08/2022 0021   AST 16 04/08/2022 0021   ALT 12 04/08/2022 0021   ALKPHOS 96 04/08/2022 0021   BILITOT 0.9 04/08/2022 0021   GFRNONAA >60 04/09/2022 0537   GFRAA >60 08/03/2018 1514   Lipase     Component Value Date/Time   LIPASE 31 04/04/2022 2201        Studies/Results: No results found.  Anti-infectives: Anti-infectives (From admission, onward)    None        Assessment/Plan SBO H/o gastric bypass 2017 Dr. Excell Seltzer - CT 10/13 w/ SBO and TP poster pelvis. Mild twisting of mesentery within RLQ. Small HH - Tolerating liquids and having bowel function but still with some bloating. Given ongoing bowel function and no nausea, will advance to bariatric advanced diet and monitor. Mobilize.   FEN: bariatric advanced ID: none VTE: heparin subq   I reviewed hospitalist notes, last 24 h vitals and pain scores, last 48 h intake and output, last 24 h labs and trends, and last 24 h imaging results.    LOS: 5 days    [facility] 04/10/2022, 9:13 AM Please see Amion for pager number during day hours 7:00am-4:30pm

## 2022-04-10 NOTE — Progress Notes (Incomplete)
2215: Patient very agitated and wanted to go to the bathroom with the telemetry leads attached to the monitor. This RN advised the patient to use the [facility] which was set up besides her bed and which she was using all day. Patient adamant to go to the bathroom. This RN tried to remove the telemetry leads from the patient but the patient tried to swing the leads towards this RN. Patient wanted to leave the hospital and started going through her personal belongings. This RN asked if she could call someone from her family she could talk to, the patient started getting verbally abusive. This RN paged the doctor and made the doctor aware of the situation. Patient talking to one of her family members on her personal phone.  2230; Patient's daughter [name] called the unit. This RN talked to her and made her aware of the situation. Daughter

## 2022-04-10 NOTE — Progress Notes (Signed)
   04/10/22 1100  Clinical Encounter Type  Visited With Patient;Family  Visit Type Follow-up  Referral From Nurse  Consult/Referral To Chaplain   Chaplain met with patient and family member to discuss [place]. Coordinated execution with [place]. Provided copies of fully executed document to family and uploaded into the patient's chart.   Melody Haver, Resident Chaplain [phone]

## 2022-04-11 ENCOUNTER — Inpatient Hospital Stay: Payer: Self-pay

## 2022-04-11 ENCOUNTER — Inpatient Hospital Stay (HOSPITAL_COMMUNITY): Payer: Medicare Other

## 2022-04-11 LAB — GLUCOSE, CAPILLARY
Glucose-Capillary: 185 mg/dL — ABNORMAL HIGH (ref 70–99)
Glucose-Capillary: 119 mg/dL — ABNORMAL HIGH (ref 70–99)
Glucose-Capillary: 124 mg/dL — ABNORMAL HIGH (ref 70–99)

## 2022-04-11 MED ORDER — IOHEXOL 350 MG/ML SOLN
75.0000 mL | Freq: Once | INTRAVENOUS | Status: AC | PRN
  Administered 2022-04-11: 75 mL via INTRAVENOUS

## 2022-04-11 NOTE — Progress Notes (Signed)
Radiology was requested for image guided NGT placement. Per RN, patient refusing bedside placement due to "traumatic experience."   Radiology team sent for the patient, she is refusing to come down to radiology for NGT placement.    Ordering provider notified. Fluoro guided NGT placement is not performed during weekend.   Please call radiology on Monday if patient is agreeable to proceed with fluoro guided NGT placement.    Armando Gang Chanz Cahall PA-C 04/11/2022 4:01 PM

## 2022-04-11 NOTE — Evaluation (Signed)
Physical Therapy Evaluation/ Discharge Patient Details Name: [name] MRN: [id number] DOB: 22-Jan-1966 Today's Date: 04/11/2022  History of Present Illness  56 y.o. female admitted 10/13 with Abd pain, N/V and SBO with conservative management. PMhx: 01/12/22 suicide attempt with intentional promethazine OD, Afib, COPD, DM, neuropathy, HTN, bipolar disorder, schizophrenia, anxiety, depression, ETOH abuse.  Clinical Impression  Pt pleasant and familiar from prior admission. Pt reports having brother and family support, walking anywhere she needs to and having returned to independent function. Pt currently able to perform transfers, gait and mobility without assist with SPO2 90-97% on RA throughout session. Pt without further acute therapy needs and will sign off with pt aware and agreeable, defer to mobility specialist.      Recommendations for follow up therapy are one component of a multi-disciplinary discharge planning process, led by the attending physician.  Recommendations may be updated based on patient status, additional functional criteria and insurance authorization.  Follow Up Recommendations No PT follow up      Assistance Recommended at Discharge PRN  Patient can return home with the following  Assist for transportation    Equipment Recommendations None recommended by PT  Recommendations for Other Services       Functional Status Assessment Patient has not had a recent decline in their functional status     Precautions / Restrictions Precautions Precautions: None      Mobility  Bed Mobility Overal bed mobility: Independent                  Transfers Overall transfer level: Independent                      Ambulation/Gait Ambulation/Gait assistance: Independent Gait Distance (Feet): 400 Feet Assistive device: None Gait Pattern/deviations: WFL(Within Functional Limits)   Gait velocity interpretation: >2.62 ft/sec, indicative of community  ambulatory      Stairs Stairs: Yes Stairs assistance: Modified independent (Device/Increase time) Stair Management: One rail Right, Alternating pattern, Forwards Number of Stairs: 8    Wheelchair Mobility    Modified Rankin (Stroke Patients Only)       Balance Overall balance assessment: No apparent balance deficits (not formally assessed)                                           Pertinent Vitals/Pain Pain Assessment Pain Assessment: No/denies pain    Home Living Family/patient expects to be discharged to:: Private residence Living Arrangements: Other relatives Available Help at Discharge: Family;Available PRN/intermittently Type of Home: House Home Access: Stairs to enter Entrance Stairs-Rails: Can reach both;Right;Left Entrance Stairs-Number of Steps: 8   Home Layout: One level Home Equipment: Cane - single point Additional Comments: brother currently living with pt. Spouse lives with mom taking care of her    Prior Function Prior Level of Function : Independent/Modified Independent               ADLs Comments: brother does most of the cooking, pt reports she doesn't really clean because she doesn't get things dirty     Hand Dominance        Extremity/Trunk Assessment   Upper Extremity Assessment Upper Extremity Assessment: Overall WFL for tasks assessed    Lower Extremity Assessment Lower Extremity Assessment: Overall WFL for tasks assessed    Cervical / Trunk Assessment Cervical / Trunk Assessment: Normal  Communication  Communication: No difficulties  Cognition Arousal/Alertness: Awake/alert Behavior During Therapy: WFL for tasks assessed/performed Overall Cognitive Status: Within Functional Limits for tasks assessed                                          General Comments      Exercises     Assessment/Plan    PT Assessment Patient does not need any further PT services  PT Problem List          PT Treatment Interventions      PT Goals (Current goals can be found in the Care Plan section)  Acute Rehab PT Goals PT Goal Formulation: All assessment and education complete, DC therapy    Frequency       Co-evaluation               AM-PAC PT "6 Clicks" Mobility  Outcome Measure Help needed turning from your back to your side while in a flat bed without using bedrails?: None Help needed moving from lying on your back to sitting on the side of a flat bed without using bedrails?: None Help needed moving to and from a bed to a chair (including a wheelchair)?: None Help needed standing up from a chair using your arms (e.g., wheelchair or bedside chair)?: None Help needed to walk in hospital room?: None Help needed climbing 3-5 steps with a railing? : None 6 Click Score: 24    End of Session   Activity Tolerance: Patient tolerated treatment well Patient left: in bed;with call bell/phone within reach (returned to bed for xray) Nurse Communication: Mobility status PT Visit Diagnosis: Other abnormalities of gait and mobility (R26.89)    Time: 6440-3474 PT Time Calculation (min) (ACUTE ONLY): 11 min   Charges:   PT Evaluation $PT Eval Low Complexity: [place], PT Acute Rehabilitation Services Office: [place] [facility] Aarush Stukey 04/11/2022, 1:47 PM

## 2022-04-11 NOTE — Evaluation (Signed)
Occupational Therapy Evaluation and Discharge Patient Details Name: [name] MRN: [id number] DOB: 05/23/1966 Today's Date: 04/11/2022   History of Present Illness 56 y.o. female admitted 10/13 with Abd pain, N/V and SBO with conservative management. PMhx: 01/12/22 suicide attempt with intentional promethazine OD, Afib, COPD, DM, neuropathy, HTN, bipolar disorder, schizophrenia, anxiety, depression, ETOH abuse.   Clinical Impression   This 56 yo female admitted with above and seen today is at an independent level for all basic ADLs. No further OT needs, we will sign off.      Recommendations for follow up therapy are one component of a multi-disciplinary discharge planning process, led by the attending physician.  Recommendations may be updated based on patient status, additional functional criteria and insurance authorization.   Follow Up Recommendations  No OT follow up    Assistance Recommended at Discharge None     Functional Status Assessment  Patient has not had a recent decline in their functional status  Equipment Recommendations  None recommended by OT       Precautions / Restrictions Precautions Precautions: None Restrictions Weight Bearing Restrictions: No      Mobility Bed Mobility Overal bed mobility: Independent                  Transfers Overall transfer level: Independent                        Balance Overall balance assessment: No apparent balance deficits (not formally assessed)                                         ADL either performed or assessed with clinical judgement   ADL Overall ADL's : Independent                                             Vision Patient Visual Report: No change from baseline              Pertinent Vitals/Pain Pain Assessment Pain Assessment: No/denies pain     Hand Dominance Right   Extremity/Trunk Assessment Upper Extremity Assessment Upper  Extremity Assessment: Overall WFL for tasks assessed      Communication Communication Communication: No difficulties   Cognition Arousal/Alertness: Awake/alert Behavior During Therapy: WFL for tasks assessed/performed Overall Cognitive Status: Within Functional Limits for tasks assessed                                                  Home Living Family/patient expects to be discharged to:: Private residence Living Arrangements: Other relatives Available Help at Discharge: Family;Available PRN/intermittently Type of Home: House Home Access: Stairs to enter [employer] of Steps: 8 Entrance Stairs-Rails: Can reach both;Right;Left Home Layout: One level     Bathroom Shower/Tub: Occupational psychologist: Standard     Home Equipment: Kasandra Knudsen - single point   Additional Comments: brother currently living with pt. Spouse lives with mom taking care of her      Prior Functioning/Environment Prior Level of Function : Independent/Modified Independent  ADLs Comments: brother does most of the cooking, pt reports she doesn't really clean because she doesn't get things dirty                 OT Goals(Current goals can be found in the care plan section) Acute Rehab OT Goals Patient Stated Goal: to eat and go home         AM-PAC OT "6 Clicks" Daily Activity     Outcome Measure Help from another person eating meals?: None Help from another person taking care of personal grooming?: None Help from another person toileting, which includes using toliet, bedpan, or urinal?: None Help from another person bathing (including washing, rinsing, drying)?: None Help from another person to put on and taking off regular upper body clothing?: None Help from another person to put on and taking off regular lower body clothing?: None 6 Click Score: 24   End of Session Equipment Utilized During Treatment:  (none)  Activity Tolerance:  Patient tolerated treatment well Patient left: in bed;with call bell/phone within reach                   Time: 1710-1722 OT Time Calculation (min): 12 min Charges:  OT General Charges $OT Visit: 1 Visit OT Evaluation $OT Eval Low Complexity: 1 Low  [place], OTR/L Acute Rehab Services [facility] [phone] Office [phone]    Almon Register 04/11/2022, 5:35 PM

## 2022-04-11 NOTE — Progress Notes (Signed)
Patient has signed AMA papers, IV removed, and patient picked up by daughter Tiffany--patient was read text page sent by Dr Olevia Bowens advising to stay, and the seriousness of leaving, I have talked with daughter [name] and daughter Jonelle Sidle about leaving AMA and the risk the patient is taking, all state they understand, patient states she has to eat, and then she will be back, I have also explained that you must come back through the ED, this room on 2C will more than likely not be available, patient states she understands, I have reviewed the plan of PICC line placement and TPN to be started 10/21, patient states she has done that before and it doesn't work, she is still  hungry, she is still leaving

## 2022-04-11 NOTE — Progress Notes (Addendum)
Patient haS refused radiology guided NG tube for today---she understaNds she is only allowed ice chips and has discussed with daughter the risks of waiting---patient has still refused, per raiology, procedure will not be today --daughter is aware and is coming to patient's room--made aware procedure could not be before Monday, since this dept is not here on weekends

## 2022-04-11 NOTE — Progress Notes (Signed)
Initial Nutrition Assessment  DOCUMENTATION CODES:   Severe malnutrition in context of chronic illness  INTERVENTION:   - TPN management per Pharmacy  Monitor magnesium, potassium, and phosphorus BID for at least 3 days, MD to replete as needed, as pt is at risk for refeeding syndrome given severe malnutrition, inadequate nutrition since admission (x 6 days).  - Recommend IV thiamine 100 mg x 5 days due to refeeding risk  - Rechecking vitamin C, vitamin A, and vitamin D labs as pt with history of deficiency  NUTRITION DIAGNOSIS:   Severe Malnutrition related to chronic illness (COPD, hx RYGB, polysubstance abuse) as evidenced by severe muscle depletion, severe fat depletion, percent weight loss (22.5% weight loss in less than 8 months).  GOAL:   Patient will meet greater than or equal to 90% of their needs  MONITOR:   Diet advancement, Labs, Weight trends, I & O's  REASON FOR ASSESSMENT:   Consult New TPN/TNA  ASSESSMENT:   56 year old female who presented to the ED on 10/13 with abdominal pain, N/V. PMH of gastric bypass in 2017, anxiety, COPD, depression, T2DM, GERD, HTN, HLD, bipolar disorder, schizophrenia. Pt with recent admission for suicide attempt complicated by aspiration pneumonia with respiratory failure requiring vent support. Pt admitted with SBO.  10/14 - NG tube placed 10/17 - NG tube clamped, clear liquid diet 10/18 - NG tube removed, bariatric full liquid diet 10/20 - NPO, repeat CT showing persistent SBO  CT today showed mechanical small bowel obstruction in the distal small bowel of the mid pelvis. Pt is NPO. Diet has not been advanced past bariatric full liquids this admission. Discussed pt with Surgery PA. Plan for PICC placement and initiation of TPN.  Discussed pt with RN. Per RN, pt is refusing NG tube placement for decompression at bedside. Surgery has ordered for NG tube placement under fluoroscopy but pt is refusing this as well unless she is  able to receive sedation. Discussed with RN that PICC and TPN have been ordered.  Spoke with pt briefly at bedside. Pt very anxious at time of RD visit and visibly upset with visitors in room so RD interview was brief. This RD did review all RD notes from pt's recent lengthy hospital admission during which time she required both TPN and enteral nutrition. Per review of these notes, pt with polysubstance abuse including EtOH.  Pt reports minimal PO intake of anything solid this admission but had been doing well with liquids. Pt reports feeling very hungry at this time. Explained plan for TPN to provide adequate nutrition.  Pt reports that she has lost weight this admission. She reports weighing 107 lbs on admission and now weighs 100 lbs. Reviewed weight history in chart. Pt with a total weight loss of 13.3 kg since 09/05/21. This is a 22.5% weight loss in less than 8 months which is severe and significant for timeframe. Pt meets criteria for severe malnutrition.  Admit weight: 48.7 kg Current weight: 45.7 kg  Medications reviewed and include: colace, SSI, novolog 4 units TID with meals, protonix  Micronutrient Panel: Thiamine B1: 107.8 (WNL) on 7/24 Vitamin B12: 1070 (H) on 8/8 Folate B9: 9.9 (WNL) on 8/8 Vitamin A: 14.7 (L) on 7/24, recheck pending Vitamin D: 10.32 (L) on 7/24, recheck pending Vitamin C: 0.3 (L) on 7/24, recheck pending Copper: 129 (WNL) on 7/24 Zinc: 48 (WNL) on 7/24 CRP: pending  Labs reviewed: BUN <5, creatinine 0.34, hemoglobin A1C 6.5 CBG's: 80-185 x 24 hours  NUTRITION - FOCUSED PHYSICAL  EXAM:  Flowsheet Row Most Recent Value  Orbital Region Moderate depletion  Upper Arm Region Severe depletion  Thoracic and Lumbar Region Severe depletion  Buccal Region Moderate depletion  Temple Region Moderate depletion  Clavicle Bone Region Severe depletion  Clavicle and Acromion Bone Region Severe depletion  Scapular Bone Region Severe depletion  Dorsal Hand Moderate  depletion  Patellar Region Severe depletion  Anterior Thigh Region Severe depletion  Posterior Calf Region Severe depletion  Edema (RD Assessment) None  Hair Reviewed  Eyes Reviewed  Mouth Reviewed  Skin Reviewed  Nails Reviewed       Diet Order:   Diet Order             Diet NPO time specified  Diet effective now                   EDUCATION NEEDS:   Education needs have been addressed  Skin:  Skin Assessment: Reviewed RN Assessment  Last BM:  04/10/22 small type 6  Height:   Ht Readings from Last 1 Encounters:  04/04/22 5' (1.524 m)    Weight:   Wt Readings from Last 1 Encounters:  04/11/22 45.7 kg    BMI:  Body mass index is 19.68 kg/m.  Estimated Nutritional Needs:   Kcal:  1600-1800  Protein:  75-90 grams  Fluid:  1.6-1.8 L    Gustavus Bryant, MS, RD, LDN Inpatient Clinical Dietitian Please see AMiON for contact information.

## 2022-04-11 NOTE — Progress Notes (Signed)
PT Cancellation Note  Patient Details Name: LYNNE TAKEMOTO MRN: [id number] DOB: 1965-08-16   Cancelled Treatment:    Reason Eval/Treat Not Completed: Patient at procedure or test/unavailable   Makenlee Mckeag B Abeer Deskins 04/11/2022, 12:13 PM Bayard Males, PT Acute Rehabilitation Services Office: [phone]

## 2022-04-11 NOTE — Progress Notes (Signed)
[facility] Progress Note     Subjective: CC-  Tolerating diet and continues to pass flatus. She reports having 2 more loose stools yesterday. Still feels bloated but denies n/v.  Objective: Vital signs in last 24 hours: Temp:  [97.8 F (36.6 C)-98.8 F (37.1 C)] 98 F (36.7 C) (10/20 0340) Pulse Rate:  [84-95] 85 (10/19 2015) Resp:  [12-20] 18 (10/19 2015) BP: (121-146)/(79-93) 140/89 (10/20 0340) SpO2:  [92 %-98 %] 98 % (10/20 0340) FiO2 (%):  [95 %] 95 % (10/19 0853) Weight:  [45.7 kg] 45.7 kg (10/20 0621) Last BM Date : 04/10/22  Intake/Output from previous day: 10/19 0701 - 10/20 0700 In: 1320 [P.O.:1320] Out: -  Intake/Output this shift: No intake/output data recorded.  PE: Gen:  Alert, NAD Abd: distended but soft, some epigastric tenderness without rebound or guarding, hypoactive bowel sounds  Lab Results:  Recent Labs    04/09/22 0537  WBC 5.4  HGB 13.5  HCT 41.5  PLT 153   BMET Recent Labs    04/09/22 0537  NA 136  K 4.6  CL 95*  CO2 29  GLUCOSE 120*  BUN <5*  CREATININE 0.34*  CALCIUM 9.0   PT/INR No results for input(s): "LABPROT", "INR" in the last 72 hours. CMP     Component Value Date/Time   NA 136 04/09/2022 0537   K 4.6 04/09/2022 0537   CL 95 (L) 04/09/2022 0537   CO2 29 04/09/2022 0537   GLUCOSE 120 (H) 04/09/2022 0537   BUN <5 (L) 04/09/2022 0537   CREATININE 0.34 (L) 04/09/2022 0537   CREATININE 0.57 03/28/2021 0000   CALCIUM 9.0 04/09/2022 0537   PROT 6.6 04/08/2022 0021   ALBUMIN 3.0 (L) 04/08/2022 0021   AST 16 04/08/2022 0021   ALT 12 04/08/2022 0021   ALKPHOS 96 04/08/2022 0021   BILITOT 0.9 04/08/2022 0021   GFRNONAA >60 04/09/2022 0537   GFRAA >60 08/03/2018 1514   Lipase     Component Value Date/Time   LIPASE 31 04/04/2022 2201       Studies/Results: No results found.  Anti-infectives: Anti-infectives (From admission, onward)    None        Assessment/Plan SBO H/o gastric  bypass 2017 Dr. Excell Seltzer - CT 10/13 w/ SBO and TP poster pelvis. Mild twisting of mesentery within RLQ. Small HH - Tolerating liquids and having bowel function, but she remains distended and has some tenderness on abdominal exam today. Repeat CT with oral contrast today. NPO until scan.   FEN: NPO ID: none VTE: heparin subq   I reviewed hospitalist notes, last 24 h vitals and pain scores, last 48 h intake and output, last 24 h labs and trends, and last 24 h imaging results.    LOS: 6 days    [facility] 04/11/2022, 8:14 AM Please see Amion for pager number during day hours 7:00am-4:30pm

## 2022-04-11 NOTE — Progress Notes (Addendum)
[facility] PROGRESS NOTE    Progress Note  [name]  VVO:[id number] DOB: January 07, 1966 DOA: 04/04/2022 PCP: [name], MD     Brief Narrative:   [name] is an 56 y.o. female past medical history of schizoaffective disorder, essential hypertension, gastric bypass in 2017, diabetes mellitus type 2 history of seizures came in with abdominal pain nausea vomiting and no bowel movement for 3 days prior to admission.,  The patient was seen with GI as an outpatient found to have colonic ileus was started on Lasix and lactulose as an outpatient, in the ED CT scan showed small bowel obstruction with a transition point.  General surgery was consulted and recommended conservative management.    Assessment/Plan:   Small bowel obstruction ([place]) General surgery on board appreciate assistance. x-ray showed contrast in the colon. NG tube has been discontinued, currently on full liquid diet. Try to keep potassium greater than 4 magnesium greater than 2. Continues to pass gas having bowel movements. Diet has been advanced. Surgery rec CT abd and pelvis.  COPD: Continue inhalers not decompensated.  Schizoaffective disorder, bipolar type ([place]) Restart home meds.  Hypomagnesemia: Resolved, as to this morning.  GERD: Continue PPI.  Essential hypertension: Continue metoprolol and amlodipine.  Diabetes mellitus type 2: Continue sliding scale insulin while meal coverage.  Seizure disorder: Continue Vimpat.  Chronic compression fracture: Noted.  Cognitive decline: Follow-up with PCP as an outpatient.  Protein-calorie malnutrition, severe Noted.  DVT prophylaxis: heparin Family Communication:daughter Status is: Inpatient Remains inpatient appropriate because: Small bowel obstruction    Code Status:     Code Status Orders  (From admission, onward)           Start     Ordered   04/05/22 0720  Full code  Continuous        04/05/22 0721            Code Status History     Date Active Date Inactive Code Status Order ID Comments User Context   01/12/2022 0901 02/12/2022 1853 Full Code [id number]  Cristal Generous, NP ED   11/14/2021 0242 11/16/2021 1829 Full Code [id number]  Kristopher Oppenheim, DO Inpatient   11/12/2021 1508 11/13/2021 1654 Full Code [id number]  Godfrey Pick, MD ED   10/10/2020 0106 10/12/2020 0048 Full Code [id number]  Vianne Bulls, MD ED   07/24/2020 1353 07/27/2020 1641 Full Code [id number]  Lequita Halt, MD ED   08/03/2018 2350 08/06/2018 1540 Full Code [id number]  Laverle Hobby, PA-C Inpatient   08/03/2018 1619 08/03/2018 2316 Full Code [id number]  Deno Etienne, DO ED   11/13/2017 0011 11/16/2017 1716 Full Code [id number]  Rozetta Nunnery, NP Inpatient   11/12/2017 1552 11/12/2017 2331 Full Code [id number]  Long, Wonda Olds, MD ED   04/18/2016 0752 04/20/2016 0007 Full Code [id number]  Kinsinger, Arta Bruce, MD ED   04/17/2016 1039 04/18/2016 0317 Full Code [id number]  Excell Seltzer, MD HOV   03/25/2016 1223 03/27/2016 2004 Full Code [id number]  Excell Seltzer, MD Inpatient   09/28/2011 1236 10/02/2011 1828 Full Code [id number]  Brand Males, MD ED   03/04/2011 1242 03/05/2011 1941 Full Code [id number]  Bonney Aid, RN Inpatient         IV Access:   Peripheral IV   Procedures and diagnostic studies:   No results found.   Medical Consultants:   None.   Subjective:    [name] continues to pass gas and having bowel movements.  Tolerating  her diet well.  Objective:    Vitals:   04/10/22 1556 04/10/22 2015 04/11/22 0340 04/11/22 0621  BP: (!) 133/93 (!) 146/87 (!) 140/89   Pulse: 95 85    Resp: 16 18    Temp: 98 F (36.7 C) 98.8 F (37.1 C) 98 F (36.7 C)   TempSrc: Oral Oral Oral   SpO2: 95% 96% 98%   Weight:    45.7 kg  Height:       SpO2: 98 % O2 Flow Rate (L/min): 2 L/min FiO2 (%): 95 %   Intake/Output Summary (Last 24 hours) at 04/11/2022 0751 Last data filed at 04/11/2022 1308 Gross per 24 hour   Intake 1320 ml  Output --  Net 1320 ml    Filed Weights   04/08/22 0336 04/09/22 0357 04/11/22 0621  Weight: 47.2 kg 46.7 kg 45.7 kg    Exam: General exam: In no acute distress. Respiratory system: Good air movement and clear to auscultation. Cardiovascular system: S1 & S2 heard, RRR. No JVD. Gastrointestinal system: Abdomen is nondistended, soft and nontender.  Extremities: No pedal edema. Skin: No rashes, lesions or ulcers Psychiatry: Judgement and insight appear normal. Mood & affect appropriate.  Data Reviewed:    Labs: Basic Metabolic Panel: Recent Labs  Lab 04/05/22 0800 04/06/22 0032 04/07/22 0010 04/08/22 0021 04/09/22 0537  NA 132* 139 137 132* 136  K 4.0 3.7 4.2 4.1 4.6  CL 98 98 98 92* 95*  CO2 26 32 '30 25 29  '$ GLUCOSE 167* 100* 105* 121* 120*  BUN <5* 5* 6 6 <5*  CREATININE 0.39* 0.46 0.56 0.45 0.34*  CALCIUM 8.6* 9.2 9.0 8.6* 9.0  MG  --   --   --  1.6* 2.0  PHOS  --  5.0* 3.9  --   --     GFR Estimated Creatinine Clearance: 55.7 mL/min (A) (by C-G formula based on SCr of 0.34 mg/dL (L)). Liver Function Tests: Recent Labs  Lab 04/04/22 2201 04/05/22 0800 04/06/22 0032 04/07/22 0010 04/08/22 0021  AST 11* 14*  --   --  16  ALT 14 16  --   --  12  ALKPHOS 142* 128*  --   --  96  BILITOT 0.4 0.2*  --   --  0.9  PROT 8.6* 7.0  --   --  6.6  ALBUMIN 4.3 3.3* 3.2* 2.9* 3.0*    Recent Labs  Lab 04/04/22 2201  LIPASE 31    No results for input(s): "AMMONIA" in the last 168 hours. Coagulation profile No results for input(s): "INR", "PROTIME" in the last 168 hours. COVID-19 Labs  No results for input(s): "DDIMER", "FERRITIN", "LDH", "CRP" in the last 72 hours.  Lab Results  Component Value Date   SARSCOV2NAA NEGATIVE 04/04/2022   SARSCOV2NAA NEGATIVE 02/11/2022   SARSCOV2NAA NEGATIVE 11/14/2021   [place] NEGATIVE 09/05/2021    CBC: Recent Labs  Lab 04/04/22 2201 04/05/22 0800 04/06/22 0032 04/08/22 0021 04/09/22 0537   WBC 8.9 7.8 9.0 10.0 5.4  NEUTROABS  --  6.1 6.9  --   --   HGB 14.7 14.1 14.6 13.7 13.5  HCT 44.6 44.6 45.0 40.3 41.5  MCV 87.6 92.3 90.7 88.8 91.0  PLT 294 256 239 230 153    Cardiac Enzymes: No results for input(s): "CKTOTAL", "CKMB", "CKMBINDEX", "TROPONINI" in the last 168 hours. BNP (last 3 results) No results for input(s): "PROBNP" in the last 8760 hours. CBG: Recent Labs  Lab 04/10/22 0802 04/10/22 1111 04/10/22  1559 04/10/22 2102 04/11/22 0607  GLUCAP 159* 98 80 131* 185*    D-Dimer: No results for input(s): "DDIMER" in the last 72 hours. Hgb A1c: No results for input(s): "HGBA1C" in the last 72 hours. Lipid Profile: No results for input(s): "CHOL", "HDL", "LDLCALC", "TRIG", "CHOLHDL", "LDLDIRECT" in the last 72 hours. Thyroid function studies: No results for input(s): "TSH", "T4TOTAL", "T3FREE", "THYROIDAB" in the last 72 hours.  Invalid input(s): "FREET3" Anemia work up: No results for input(s): "VITAMINB12", "FOLATE", "FERRITIN", "TIBC", "IRON", "RETICCTPCT" in the last 72 hours. Sepsis Labs: Recent Labs  Lab 04/05/22 0101 04/05/22 0800 04/06/22 0032 04/08/22 0021 04/09/22 0537  WBC  --  7.8 9.0 10.0 5.4  LATICACIDVEN 0.8  --   --   --   --     Microbiology Recent Results (from the past 240 hour(s))  Resp Panel by RT-PCR (Flu A&B, Covid) Anterior Nasal Swab     Status: None   Collection Time: 04/04/22 11:42 PM   Specimen: Anterior Nasal Swab  Result Value Ref Range Status   SARS Coronavirus 2 by RT PCR NEGATIVE NEGATIVE Final    Comment: (NOTE) SARS-CoV-2 target nucleic acids are NOT DETECTED.  The SARS-CoV-2 RNA is generally detectable in upper respiratory specimens during the acute phase of infection. The lowest concentration of SARS-CoV-2 viral copies this assay can detect is 138 copies/mL. A negative result does not preclude SARS-Cov-2 infection and should not be used as the sole basis for treatment or other patient management decisions.  A negative result may occur with  improper specimen collection/handling, submission of specimen other than nasopharyngeal swab, presence of viral mutation(s) within the areas targeted by this assay, and inadequate number of viral copies(<138 copies/mL). A negative result must be combined with clinical observations, patient history, and epidemiological information. The expected result is Negative.  Fact Sheet for Patients:  EntrepreneurPulse.com.au  Fact Sheet for Healthcare Providers:  IncredibleEmployment.be  This test is no t yet approved or cleared by the [place] FDA and  has been authorized for detection and/or diagnosis of SARS-CoV-2 by FDA under an Emergency Use Authorization (EUA). This EUA will remain  in effect (meaning this test can be used) for the duration of the COVID-19 declaration under Section 564(b)(1) of the Act, 21 U.S.C.section 360bbb-3(b)(1), unless the authorization is terminated  or revoked sooner.       Influenza A by PCR NEGATIVE NEGATIVE Final   Influenza B by PCR NEGATIVE NEGATIVE Final    Comment: (NOTE) The Xpert Xpress SARS-CoV-2/FLU/RSV plus assay is intended as an aid in the diagnosis of influenza from Nasopharyngeal swab specimens and should not be used as a sole basis for treatment. Nasal washings and aspirates are unacceptable for Xpert Xpress SARS-CoV-2/FLU/RSV testing.  Fact Sheet for Patients: EntrepreneurPulse.com.au  Fact Sheet for Healthcare Providers: IncredibleEmployment.be  This test is not yet approved or cleared by the [place] FDA and has been authorized for detection and/or diagnosis of SARS-CoV-2 by FDA under an Emergency Use Authorization (EUA). This EUA will remain in effect (meaning this test can be used) for the duration of the COVID-19 declaration under Section 564(b)(1) of the Act, 21 U.S.C. section 360bbb-3(b)(1), unless the authorization  is terminated or revoked.  Performed at [facility], [address] [place], [place] [address]      Medications:    amLODipine  5 mg Oral Daily   ARIPiprazole  5 mg Oral BID   docusate sodium  100 mg Oral BID   heparin  5,000 Units Subcutaneous Q8H   insulin aspart  0-15 Units Subcutaneous TID WC   insulin aspart  0-5 Units Subcutaneous QHS   insulin aspart  4 Units Subcutaneous TID WC   lacosamide  50 mg Per NG tube BID   mometasone-formoterol  2 puff Inhalation BID   ondansetron (ZOFRAN) IV  4 mg Intravenous Once   pantoprazole  40 mg Oral Daily   PARoxetine  20 mg Oral q morning   QUEtiapine  400 mg Oral QHS   Continuous Infusions:      LOS: 6 days   Charlynne Cousins  [facility]  04/11/2022, 7:51 AM

## 2022-04-12 DIAGNOSIS — J449 Chronic obstructive pulmonary disease, unspecified: Secondary | ICD-10-CM | POA: Diagnosis not present

## 2022-04-12 NOTE — Discharge Summary (Signed)
Physician Discharge Summary  CORENA TILSON AUQ:[id number] DOB: 1966-04-03 DOA: 04/04/2022  PCP: [name], MD  Admit date: 04/04/2022 Discharge date: 04/12/2022  Admitted From: Home Disposition:  None        Left AMA Recommendations for Outpatient Follow-up:  Follow-up significance medical placed to the right having a small bowel obstruction  Home Health:nO Equipment/Devices:NONE  Discharge Condition: Guarded CODE STATUS:Full Diet recommendation: Heart Healthy  Brief/Interim Summary: 56 y.o. female past medical history of schizoaffective disorder, essential hypertension, gastric bypass in 2017, diabetes mellitus type 2 history of seizures came in with abdominal pain nausea vomiting and no bowel movement for 3 days prior to admission.,  The patient was seen with GI as an outpatient found to have colonic ileus was started on Lasix and lactulose as an outpatient, in the ED CT scan showed small bowel obstruction with a transition point.  General surgery was consulted and recommended conservative management.  Discharge Diagnoses:  Principal Problem:   Small bowel obstruction (HCC) Active Problems:   Schizoaffective disorder, bipolar type (HCC)   Constipation   Protein-calorie malnutrition, severe   Essential hypertension   GERD (gastroesophageal reflux disease)   Hypomagnesemia  Small bowel obstruction: General surgery was consulted who performed a small bowel protocol.  Initially was treated conservatively with NG tube IV fluids try to keep her potassium greater than 4 and magnesium greater than 2. She started passing gas and having bowel movement. Her abdomen was distended, she requested the NG tube to be discontinued. CT scan of the abdomen pelvis was done the recommended persistent small bowel obstruction despite knowing the risk and benefits she still decided to leave [facility] as she was placed n.p.o.   COPD: Continue inhalers not decompensated.    Schizoaffective disorder, bipolar type ([place]) Restart home meds.   Hypomagnesemia: Resolved, as to this morning.   GERD: Continue PPI.   Essential hypertension: Continue metoprolol and amlodipine.   Diabetes mellitus type 2: Continue sliding scale insulin while meal coverage.   Seizure disorder: Continue Vimpat.   Chronic compression fracture: Noted.   Cognitive decline: Follow-up with PCP as an outpatient.   Protein-calorie malnutrition, severe Noted. Discharge Instructions   Allergies as of 04/11/2022       Reactions   Iron Dextran Shortness Of Breath, Anxiety   Aspirin Nausea And Vomiting, Other (See Comments)   Ok to take tylenol or ibuprofen        Medication List     ASK your doctor about these medications    albuterol 108 (90 Base) MCG/ACT inhaler Commonly known as: ProAir HFA Inhale 2 puffs into the lungs every 6 (six) hours as needed for wheezing or shortness of breath.   amLODipine 5 MG tablet Commonly known as: NORVASC Take 1 tablet (5 mg total) by mouth daily.   ARIPiprazole 5 MG tablet Commonly known as: ABILIFY Take 1 tablet (5 mg total) by mouth 2 (two) times daily.   ascorbic acid 500 MG tablet Commonly known as: VITAMIN C Take 1 tablet (500 mg total) by mouth daily.   Cholecalciferol 25 MCG (1000 UT) capsule Take 1 capsule (1,000 Units total) by mouth daily.   folic acid 1 MG tablet Commonly known as: FOLVITE Take 1 tablet (1 mg total) by mouth daily.   gabapentin 300 MG capsule Commonly known as: NEURONTIN Take 1 capsule (300 mg total) by mouth daily AND 2 capsules (600 mg total) at bedtime.   ibuprofen 800 MG tablet Commonly known as: ADVIL Take 800  mg by mouth 3 (three) times daily as needed for pain.   insulin aspart 100 UNIT/ML injection Commonly known as: novoLOG Inject 0-15 Units into the skin 3 (three) times daily with meals. CBG < 70: treat hypoglycemia   CBG 70 - 120: 0 units  CBG 121 - 150: 2 units  CBG 151 -  200: 3 units  CBG 201 - 250: 5 units  CBG 251 - 300: 8 units  CBG 301 - 350: 11 units  CBG 351 - 400: 15 units  CBG > 400: call MD   ipratropium-albuterol 0.5-2.5 (3) MG/3ML Soln Commonly known as: DUONEB Take 3 mLs by nebulization every 6 (six) hours as needed.   lactulose 10 GM/15ML solution Commonly known as: CHRONULAC Take 45 mLs (30 g total) by mouth 2 (two) times daily.   metFORMIN 500 MG tablet Commonly known as: Glucophage Take 1 tablet (500 mg total) by mouth 2 (two) times daily with a meal.   pantoprazole 40 MG tablet Commonly known as: Protonix Take 1 tablet (40 mg total) by mouth daily.   PARoxetine 20 MG tablet Commonly known as: PAXIL Take 20 mg by mouth every morning.   QUEtiapine 400 MG tablet Commonly known as: SEROQUEL Take 400 mg by mouth at bedtime.   Symbicort 160-4.5 MCG/ACT inhaler Generic drug: budesonide-formoterol Inhale 2 puffs into the lungs daily as needed (sob and wheezing).        Follow-up Information     [facility] Follow up.   Specialty: [place] Why: Agency will call you to set up apt times Contact information: [address] [place] [address] [phone]         Farrel Conners, MD Follow up on 04/28/2022.   Specialty: Family Medicine Why: 10:30 for new patient establishment visit , please arrive by 10:15 , new patient packet will be sent in the mail, please bring photo id and insurance card Contact information: [place] [address] [phone]         [name], MD Follow up on 04/22/2022.   Specialty: Internal Medicine Why: 11:15 for telehealth visit Contact information: [place] [address] [phone]                Allergies  Allergen Reactions   Iron Dextran Shortness Of Breath and Anxiety   Aspirin Nausea And Vomiting and Other (See Comments)    Ok to take tylenol or ibuprofen      Consultations: General surgery   Procedures/Studies: [place] EKG SITE RITE  Result Date: 04/11/2022 If Site Rite image not attached, placement could not be confirmed due to current cardiac rhythm.  DG Abd Portable 1V  Result Date: 04/11/2022 CLINICAL DATA:  Abdominal pain and distension, small-bowel obstruction EXAM: PORTABLE ABDOMEN - 1 VIEW COMPARISON:  CT done on 04/11/2022 FINDINGS: There is marked dilation of small-bowel loops measuring up to 5.6 cm. Stomach is not distended. Colon is not distended. There is contrast in the collecting systems in the kidneys and urinary bladder from previous CT. IMPRESSION: There is marked dilation of small-bowel loops suggesting high-grade small bowel obstruction. Electronically Signed   By: Elmer Picker M.D.   On: 04/11/2022 14:13   CT ABDOMEN PELVIS W CONTRAST  Result Date: 04/11/2022 CLINICAL DATA:  Small bowel obstruction EXAM: CT ABDOMEN AND PELVIS WITH CONTRAST TECHNIQUE: Multidetector CT imaging of the abdomen and pelvis was performed using the standard protocol following bolus administration of intravenous contrast.  RADIATION DOSE REDUCTION: This exam was performed according to the departmental dose-optimization program which includes automated exposure control, adjustment of the mA and/or kV according to patient size and/or use of iterative reconstruction technique. CONTRAST:  66m OMNIPAQUE IOHEXOL 350 MG/ML SOLN COMPARISON:  Radiograph 04/08/2022, CT 04/05/2022 FINDINGS: Lower chest: Lung bases are clear. Hepatobiliary: No focal hepatic lesion. No biliary duct dilatation. Common bile duct is normal. Pancreas: Pancreas is normal. No ductal dilatation. No pancreatic inflammation. Spleen: Normal spleen Adrenals/urinary tract: Adrenal glands and kidneys are normal. The ureters and bladder normal. Stomach/Bowel: Post gastric bypass anatomy. Contrast flows through the gastrojejunostomy. The small bowel just distal the gastrojejunostomy is  relatively normal caliber. As contrast progresses through the bowel there is marked distension of the distal small bowel up to 3.6 cm in the LEFT abdomen and RIGHT abdomen. Enteric enteric anastomosis in the LEFT mid abdomen is patent (image 40/3). The most distal loops of small bowel in the pelvis do not have oral contrast and distended up to 4.7 cm (image 69/3). Small bowel entering the terminal ileum is decompressed 1.4 cm (image 66/3). Transition point appears to be in the mid pelvis. No portal venous gas. Pneumatosis intestinalis. No intraperitoneal free air. Colon is collapsed. Vascular/Lymphatic: Abdominal aorta is normal caliber with atherosclerotic calcification. There is no retroperitoneal or periportal lymphadenopathy. No pelvic lymphadenopathy. Reproductive: Unremarkable Other: Small volume free fluid the pelvis Musculoskeletal: No aggressive osseous lesion. IMPRESSION: 1. Mechanical small bowel obstruction. Obstruction appears in the distal small bowel of the mid pelvis. 2. Post gastric bypass Roux-en-Y anatomy. No evidence obstruction at the gastrojejunostomy or enteric enteric anastomosis. These results will be called to the ordering clinician or representative by the Radiologist Assistant, and communication documented in the PACS or [employer] Electronically Signed   By: SSuzy BouchardM.D.   On: 04/11/2022 12:43   DG Abd Portable 1V  Result Date: 04/08/2022 CLINICAL DATA:  Small bowel obstruction. EXAM: PORTABLE ABDOMEN - 1 VIEW COMPARISON:  April 07, 2022. FINDINGS: Nasogastric tube tip is seen in expected position of distal stomach. Residual contrast is noted in the nondilated colon. Slightly improved small bowel dilatation is noted consistent with ileus or obstruction. IMPRESSION: Slightly improved small bowel dilatation is noted as described above. Electronically Signed   By: JMarijo ConceptionM.D.   On: 04/08/2022 08:27   DG Abd 1 View  Result Date: 04/07/2022 CLINICAL DATA:   Small bowel obstruction.  NG tube present. EXAM: ABDOMEN - 1 VIEW COMPARISON:  One-view abdomen 04/06/2022 FINDINGS: NG tube is stable position within the stomach. Persistent dilated loops of small bowel are present throughout the abdomen. Slow transit of contrast is now present in the more distal small bowel. IMPRESSION: Persistent dilated loops of small bowel throughout the abdomen with slow transit of contrast in the more distal small bowel. Findings compatible with diffuse ileus versus partial obstruction. Electronically Signed   By: CSan MorelleM.D.   On: 04/07/2022 07:33   DG Abd 1 View  Result Date: 04/06/2022 CLINICAL DATA:  Small bowel obstruction. EXAM: ABDOMEN - 1 VIEW COMPARISON:  04/05/2022 FINDINGS: Diffuse gaseous small bowel dilatation persists, similar to prior. Contrast material remains within small bowel loops of the pelvis. No evidence for colonic contrast. IMPRESSION: Persistent diffuse gaseous small bowel dilatation. Contrast material remains within small bowel loops of the pelvis with no colonic contrast yet visible. Electronically Signed   By: EMisty StanleyM.D.   On: 04/06/2022 12:42   DG Abd Portable 1V-Small  Bowel Obstruction Protocol-initial, 8 hr delay  Result Date: 04/05/2022 CLINICAL DATA:  NG tube placement EXAM: PORTABLE ABDOMEN - 1 VIEW COMPARISON:  04/05/2022 FINDINGS: Enteric tube terminates in the proximal gastric body. Dilated loops of small bowel in the central abdomen. Contrast within pelvic loops of distal small bowel. IMPRESSION: Enteric tube terminates in the proximal gastric body. Electronically Signed   By: Julian Hy M.D.   On: 04/05/2022 22:26   DG Abd Portable 1V  Result Date: 04/05/2022 CLINICAL DATA:  NG tube placement. EXAM: PORTABLE ABDOMEN - 1 VIEW COMPARISON:  04/05/2022, earlier same day FINDINGS: NG tube is positioned with the tip in the gastric antrum. Proximal side port well below the GE junction. Gaseous distention of small  bowel visible in the upper abdomen. IMPRESSION: NG tube tip is in the gastric antrum. Electronically Signed   By: Misty Stanley M.D.   On: 04/05/2022 11:43   DG Abd Portable 1V  Result Date: 04/05/2022 CLINICAL DATA:  NG tube placement EXAM: PORTABLE ABDOMEN - 1 VIEW COMPARISON:  04/04/2022 FINDINGS: Limited radiograph of the lower chest and upper abdomen was obtained for the purposes of enteric tube localization. Enteric tube is seen coursing below the diaphragm and is coiled in the left upper quadrant with distal tip terminating near the midline, likely within the proximal small bowel. Tube appears partially kinked proximal to the side port. IMPRESSION: Enteric tube coiled in the left upper quadrant, likely terminating within the proximal small bowel. Tube appears partially kinked proximal to the side port. Electronically Signed   By: Davina Poke D.O.   On: 04/05/2022 10:03   CT ABDOMEN PELVIS W CONTRAST  Result Date: 04/05/2022 CLINICAL DATA:  Abdominal pain with nausea and vomiting. EXAM: CT ABDOMEN AND PELVIS WITH CONTRAST TECHNIQUE: Multidetector CT imaging of the abdomen and pelvis was performed using the standard protocol following bolus administration of intravenous contrast. RADIATION DOSE REDUCTION: This exam was performed according to the departmental dose-optimization program which includes automated exposure control, adjustment of the mA and/or kV according to patient size and/or use of iterative reconstruction technique. CONTRAST:  64m OMNIPAQUE IOHEXOL 350 MG/ML SOLN COMPARISON:  None Available. FINDINGS: Lower chest: No acute abnormality. Hepatobiliary: No focal liver abnormality is seen. No gallstones, gallbladder wall thickening, or biliary dilatation. Pancreas: Unremarkable. No pancreatic ductal dilatation or surrounding inflammatory changes. Spleen: Normal in size without focal abnormality. Adrenals/Urinary Tract: Adrenal glands are unremarkable. Kidneys are normal, without renal  calculi, focal lesion, or hydronephrosis. Bladder is unremarkable. Stomach/Bowel: There is a small hiatal hernia with surgical sutures noted throughout the gastric region. Surgically anastomosed bowel is also seen within the anteromedial aspect of the mid to lower left abdomen. The appendix is not clearly identified. Multiple dilated small bowel loops are seen within the left lower quadrant and left hemipelvis (maximum small bowel diameter of approximately 3.4 cm). A transition zone is noted within the posterior aspect of the pelvis (axial CT images 64 through 71, CT series 2). Mild twisting of the mesentery is suspected within the right lower quadrant and right hemipelvis (axial CT images 48 through 58, CT series 2). Vascular/Lymphatic: Aortic atherosclerosis. No enlarged abdominal or pelvic lymph nodes. Reproductive: Status post hysterectomy. No adnexal masses. Other: No abdominal wall hernia or abnormality. No abdominopelvic ascites. Musculoskeletal: No acute or significant osseous findings. IMPRESSION: 1. Small-bowel obstruction with a transition zone noted within the posterior aspect of the pelvis. 2. Mild twisting of the mesentery within the right lower quadrant and right hemipelvis which  may represent sequelae associated with an internal hernia. 3. 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 4. Small hiatal hernia. 5. Aortic atherosclerosis. Aortic Atherosclerosis (ICD10-I70.0). Electronically Signed   By: Virgina Norfolk M.D.   On: 04/05/2022 00:17   CT Angio Chest PE W and/or Wo Contrast  Result Date: 04/05/2022 CLINICAL DATA:  Decreasing oxygen saturation. EXAM: CT ANGIOGRAPHY CHEST WITH CONTRAST TECHNIQUE: Multidetector CT imaging of the chest was performed using the standard protocol during bolus administration of intravenous contrast. Multiplanar CT image reconstructions and MIPs were obtained to evaluate the vascular anatomy. RADIATION DOSE REDUCTION: This exam was  performed according to the departmental dose-optimization program which includes automated exposure control, adjustment of the mA and/or kV according to patient size and/or use of iterative reconstruction technique. CONTRAST:  37m OMNIPAQUE IOHEXOL 350 MG/ML SOLN COMPARISON:  Nov 14, 2021 FINDINGS: Cardiovascular: Satisfactory opacification of the pulmonary arteries to the segmental level. No evidence of pulmonary embolism. Normal heart size. No pericardial effusion. Mediastinum/Nodes: No enlarged mediastinal, hilar, or axillary lymph nodes. Thyroid gland, trachea, and esophagus demonstrate no significant findings. Lungs/Pleura: Lungs are clear. No pleural effusion or pneumothorax. Upper Abdomen: There is a small hiatal hernia with surgical sutures noted along the gastric region. Musculoskeletal: A chronic compression fracture deformity is seen at the level of T9. Multilevel degenerative changes are noted throughout the remainder of the thoracic spine. Review of the MIP images confirms the above findings. IMPRESSION: 1. No evidence of pulmonary embolism or other acute intrathoracic process. 2. Chronic compression fracture deformity at the level of T9. Electronically Signed   By: TVirgina NorfolkM.D.   On: 04/05/2022 00:10   DG Chest Portable 1 View  Result Date: 04/04/2022 CLINICAL DATA:  Abdominal pain. EXAM: PORTABLE CHEST 1 VIEW COMPARISON:  February 04, 2022 FINDINGS: The heart size and mediastinal contours are within normal limits. Low lung volumes are noted with mild, diffuse, chronic appearing increased lung markings. A trace amount of atelectasis is suspected within the bilateral lung bases. There is no evidence of an acute infiltrate, pleural effusion or pneumothorax. Multilevel degenerative changes are seen throughout the thoracic spine. IMPRESSION: Low lung volumes without evidence of acute or active cardiopulmonary disease. Electronically Signed   By: TVirgina NorfolkM.D.   On: 04/04/2022 23:38    (Echo, Carotid, EGD, Colonoscopy, ERCP)    Subjective:   Discharge Exam: Vitals:   04/11/22 1344 04/11/22 1539  BP:  134/85  Pulse:  86  Resp:  18  Temp:  98.6 F (37 C)  SpO2: 90% 94%   Vitals:   04/11/22 0804 04/11/22 1151 04/11/22 1344 04/11/22 1539  BP: 120/87 130/82  134/85  Pulse: 85 96  86  Resp: '20 18  18  '$ Temp: 98.5 F (36.9 C) 98 F (36.7 C)  98.6 F (37 C)  TempSrc: Oral Oral  Oral  SpO2: 95% 92% 90% 94%  Weight:      Height:        General: Pt is alert, awake, not in acute distress Cardiovascular: RRR, S1/S2 +, no rubs, no gallops Respiratory: CTA bilaterally, no wheezing, no rhonchi Abdominal: Soft, NT, ND, bowel sounds + Extremities: no edema, no cyanosis    The results of significant diagnostics from this hospitalization (including imaging, microbiology, ancillary and laboratory) are listed below for reference.     Microbiology: Recent Results (from the past 240 hour(s))  Resp Panel by RT-PCR (Flu A&B, Covid) Anterior Nasal Swab     Status: None  Collection Time: 04/04/22 11:42 PM   Specimen: Anterior Nasal Swab  Result Value Ref Range Status   SARS Coronavirus 2 by RT PCR NEGATIVE NEGATIVE Final    Comment: (NOTE) SARS-CoV-2 target nucleic acids are NOT DETECTED.  The SARS-CoV-2 RNA is generally detectable in upper respiratory specimens during the acute phase of infection. The lowest concentration of SARS-CoV-2 viral copies this assay can detect is 138 copies/mL. A negative result does not preclude SARS-Cov-2 infection and should not be used as the sole basis for treatment or other patient management decisions. A negative result may occur with  improper specimen collection/handling, submission of specimen other than nasopharyngeal swab, presence of viral mutation(s) within the areas targeted by this assay, and inadequate number of viral copies(<138 copies/mL). A negative result must be combined with clinical observations, patient  history, and epidemiological information. The expected result is Negative.  Fact Sheet for Patients:  EntrepreneurPulse.com.au  Fact Sheet for Healthcare Providers:  IncredibleEmployment.be  This test is no t yet approved or cleared by the [place] FDA and  has been authorized for detection and/or diagnosis of SARS-CoV-2 by FDA under an Emergency Use Authorization (EUA). This EUA will remain  in effect (meaning this test can be used) for the duration of the COVID-19 declaration under Section 564(b)(1) of the Act, 21 U.S.C.section 360bbb-3(b)(1), unless the authorization is terminated  or revoked sooner.       Influenza A by PCR NEGATIVE NEGATIVE Final   Influenza B by PCR NEGATIVE NEGATIVE Final    Comment: (NOTE) The Xpert Xpress SARS-CoV-2/FLU/RSV plus assay is intended as an aid in the diagnosis of influenza from Nasopharyngeal swab specimens and should not be used as a sole basis for treatment. Nasal washings and aspirates are unacceptable for Xpert Xpress SARS-CoV-2/FLU/RSV testing.  Fact Sheet for Patients: EntrepreneurPulse.com.au  Fact Sheet for Healthcare Providers: IncredibleEmployment.be  This test is not yet approved or cleared by the [place] FDA and has been authorized for detection and/or diagnosis of SARS-CoV-2 by FDA under an Emergency Use Authorization (EUA). This EUA will remain in effect (meaning this test can be used) for the duration of the COVID-19 declaration under Section 564(b)(1) of the Act, 21 U.S.C. section 360bbb-3(b)(1), unless the authorization is terminated or revoked.  Performed at [facility], [address] [place], [place] [address]      Labs: BNP (last 3 results) Recent Labs    02/05/22 0706 02/11/22 0204 02/12/22 1048  BNP 74.0 31.7 62.1   Basic Metabolic Panel: Recent Labs  Lab 04/06/22 0032 04/07/22 0010  04/08/22 0021 04/09/22 0537  NA 139 137 132* 136  K 3.7 4.2 4.1 4.6  CL 98 98 92* 95*  CO2 32 '30 25 29  '$ GLUCOSE 100* 105* 121* 120*  BUN 5* 6 6 <5*  CREATININE 0.46 0.56 0.45 0.34*  CALCIUM 9.2 9.0 8.6* 9.0  MG  --   --  1.6* 2.0  PHOS 5.0* 3.9  --   --    Liver Function Tests: Recent Labs  Lab 04/06/22 0032 04/07/22 0010 04/08/22 0021  AST  --   --  16  ALT  --   --  12  ALKPHOS  --   --  96  BILITOT  --   --  0.9  PROT  --   --  6.6  ALBUMIN 3.2* 2.9* 3.0*   No results for input(s): "LIPASE", "AMYLASE" in the last 168 hours. No results for input(s): "AMMONIA" in the last 168 hours.  CBC: Recent Labs  Lab 04/06/22 0032 04/08/22 0021 04/09/22 0537  WBC 9.0 10.0 5.4  NEUTROABS 6.9  --   --   HGB 14.6 13.7 13.5  HCT 45.0 40.3 41.5  MCV 90.7 88.8 91.0  PLT 239 230 153   Cardiac Enzymes: No results for input(s): "CKTOTAL", "CKMB", "CKMBINDEX", "TROPONINI" in the last 168 hours. BNP: Invalid input(s): "POCBNP" CBG: Recent Labs  Lab 04/10/22 1559 04/10/22 2102 04/11/22 0607 04/11/22 1150 04/11/22 1537  GLUCAP 80 131* 185* 119* 124*   D-Dimer No results for input(s): "DDIMER" in the last 72 hours. Hgb A1c No results for input(s): "HGBA1C" in the last 72 hours. Lipid Profile No results for input(s): "CHOL", "HDL", "LDLCALC", "TRIG", "CHOLHDL", "LDLDIRECT" in the last 72 hours. Thyroid function studies No results for input(s): "TSH", "T4TOTAL", "T3FREE", "THYROIDAB" in the last 72 hours.  Invalid input(s): "FREET3" Anemia work up No results for input(s): "VITAMINB12", "FOLATE", "FERRITIN", "TIBC", "IRON", "RETICCTPCT" in the last 72 hours. Urinalysis    Component Value Date/Time   COLORURINE YELLOW 04/04/2022 2201   APPEARANCEUR CLEAR 04/04/2022 2201   LABSPEC 1.032 (H) 04/04/2022 2201   PHURINE 5.5 04/04/2022 2201   GLUCOSEU NEGATIVE 04/04/2022 2201   HGBUR NEGATIVE 04/04/2022 2201   BILIRUBINUR NEGATIVE 04/04/2022 2201   KETONESUR NEGATIVE  04/04/2022 2201   PROTEINUR 30 (A) 04/04/2022 2201   UROBILINOGEN 0.2 09/04/2013 0426   NITRITE NEGATIVE 04/04/2022 2201   LEUKOCYTESUR NEGATIVE 04/04/2022 2201   Sepsis Labs Recent Labs  Lab 04/06/22 0032 04/08/22 0021 04/09/22 0537  WBC 9.0 10.0 5.4   Microbiology Recent Results (from the past 240 hour(s))  Resp Panel by RT-PCR (Flu A&B, Covid) Anterior Nasal Swab     Status: None   Collection Time: 04/04/22 11:42 PM   Specimen: Anterior Nasal Swab  Result Value Ref Range Status   SARS Coronavirus 2 by RT PCR NEGATIVE NEGATIVE Final    Comment: (NOTE) SARS-CoV-2 target nucleic acids are NOT DETECTED.  The SARS-CoV-2 RNA is generally detectable in upper respiratory specimens during the acute phase of infection. The lowest concentration of SARS-CoV-2 viral copies this assay can detect is 138 copies/mL. A negative result does not preclude SARS-Cov-2 infection and should not be used as the sole basis for treatment or other patient management decisions. A negative result may occur with  improper specimen collection/handling, submission of specimen other than nasopharyngeal swab, presence of viral mutation(s) within the areas targeted by this assay, and inadequate number of viral copies(<138 copies/mL). A negative result must be combined with clinical observations, patient history, and epidemiological information. The expected result is Negative.  Fact Sheet for Patients:  EntrepreneurPulse.com.au  Fact Sheet for Healthcare Providers:  IncredibleEmployment.be  This test is no t yet approved or cleared by the [place] FDA and  has been authorized for detection and/or diagnosis of SARS-CoV-2 by FDA under an Emergency Use Authorization (EUA). This EUA will remain  in effect (meaning this test can be used) for the duration of the COVID-19 declaration under Section 564(b)(1) of the Act, 21 U.S.C.section 360bbb-3(b)(1), unless the  authorization is terminated  or revoked sooner.       Influenza A by PCR NEGATIVE NEGATIVE Final   Influenza B by PCR NEGATIVE NEGATIVE Final    Comment: (NOTE) The Xpert Xpress SARS-CoV-2/FLU/RSV plus assay is intended as an aid in the diagnosis of influenza from Nasopharyngeal swab specimens and should not be used as a sole basis for treatment. Nasal washings and aspirates are unacceptable for  Xpert Xpress SARS-CoV-2/FLU/RSV testing.  Fact Sheet for Patients: EntrepreneurPulse.com.au  Fact Sheet for Healthcare Providers: IncredibleEmployment.be  This test is not yet approved or cleared by the [place] FDA and has been authorized for detection and/or diagnosis of SARS-CoV-2 by FDA under an Emergency Use Authorization (EUA). This EUA will remain in effect (meaning this test can be used) for the duration of the COVID-19 declaration under Section 564(b)(1) of the Act, 21 U.S.C. section 360bbb-3(b)(1), unless the authorization is terminated or revoked.  Performed at [facility], [address] [place], [place] [address]       SIGNED:   Charlynne Cousins, MD  [facility] 04/12/2022, 10:46 AM Pager   If 7PM-7AM, please contact night-coverage [email or web address] Password [email or web address]

## 2022-04-15 ENCOUNTER — Emergency Department (HOSPITAL_COMMUNITY): Payer: Medicare Other

## 2022-04-15 ENCOUNTER — Other Ambulatory Visit: Payer: Self-pay

## 2022-04-15 ENCOUNTER — Emergency Department (HOSPITAL_COMMUNITY)
Admission: EM | Admit: 2022-04-15 | Discharge: 2022-04-16 | Discharge status: Against Medical Advice | Payer: Medicare Other | Source: Home / Self Care

## 2022-04-15 ENCOUNTER — Encounter (HOSPITAL_COMMUNITY): Payer: Self-pay | Admitting: Emergency Medicine

## 2022-04-15 DIAGNOSIS — Z9151 Personal history of suicidal behavior: Secondary | ICD-10-CM | POA: Diagnosis not present

## 2022-04-15 DIAGNOSIS — Z886 Allergy status to analgesic agent status: Secondary | ICD-10-CM | POA: Diagnosis not present

## 2022-04-15 DIAGNOSIS — K565 Intestinal adhesions [bands], unspecified as to partial versus complete obstruction: Secondary | ICD-10-CM | POA: Diagnosis not present

## 2022-04-15 DIAGNOSIS — I10 Essential (primary) hypertension: Secondary | ICD-10-CM | POA: Diagnosis not present

## 2022-04-15 DIAGNOSIS — K6389 Other specified diseases of intestine: Secondary | ICD-10-CM | POA: Diagnosis not present

## 2022-04-15 DIAGNOSIS — Z8249 Family history of ischemic heart disease and other diseases of the circulatory system: Secondary | ICD-10-CM | POA: Diagnosis not present

## 2022-04-15 DIAGNOSIS — Z781 Physical restraint status: Secondary | ICD-10-CM | POA: Diagnosis not present

## 2022-04-15 DIAGNOSIS — Z794 Long term (current) use of insulin: Secondary | ICD-10-CM | POA: Diagnosis not present

## 2022-04-15 DIAGNOSIS — E039 Hypothyroidism, unspecified: Secondary | ICD-10-CM | POA: Diagnosis not present

## 2022-04-15 DIAGNOSIS — Z888 Allergy status to other drugs, medicaments and biological substances status: Secondary | ICD-10-CM | POA: Diagnosis not present

## 2022-04-15 DIAGNOSIS — G8929 Other chronic pain: Secondary | ICD-10-CM | POA: Diagnosis not present

## 2022-04-15 DIAGNOSIS — R109 Unspecified abdominal pain: Secondary | ICD-10-CM | POA: Insufficient documentation

## 2022-04-15 DIAGNOSIS — Z5321 Procedure and treatment not carried out due to patient leaving prior to being seen by health care provider: Secondary | ICD-10-CM | POA: Insufficient documentation

## 2022-04-15 DIAGNOSIS — K219 Gastro-esophageal reflux disease without esophagitis: Secondary | ICD-10-CM | POA: Diagnosis not present

## 2022-04-15 DIAGNOSIS — Z9884 Bariatric surgery status: Secondary | ICD-10-CM | POA: Diagnosis not present

## 2022-04-15 DIAGNOSIS — R569 Unspecified convulsions: Secondary | ICD-10-CM | POA: Diagnosis not present

## 2022-04-15 DIAGNOSIS — T8131XA Disruption of external operation (surgical) wound, not elsewhere classified, initial encounter: Secondary | ICD-10-CM | POA: Diagnosis not present

## 2022-04-15 DIAGNOSIS — R000 Tachycardia, unspecified: Secondary | ICD-10-CM | POA: Diagnosis not present

## 2022-04-15 DIAGNOSIS — R1032 Left lower quadrant pain: Secondary | ICD-10-CM | POA: Diagnosis not present

## 2022-04-15 DIAGNOSIS — I4891 Unspecified atrial fibrillation: Secondary | ICD-10-CM | POA: Diagnosis not present

## 2022-04-15 DIAGNOSIS — F17210 Nicotine dependence, cigarettes, uncomplicated: Secondary | ICD-10-CM | POA: Diagnosis not present

## 2022-04-15 DIAGNOSIS — E1140 Type 2 diabetes mellitus with diabetic neuropathy, unspecified: Secondary | ICD-10-CM | POA: Diagnosis not present

## 2022-04-15 DIAGNOSIS — K56609 Unspecified intestinal obstruction, unspecified as to partial versus complete obstruction: Secondary | ICD-10-CM | POA: Diagnosis not present

## 2022-04-15 DIAGNOSIS — K56699 Other intestinal obstruction unspecified as to partial versus complete obstruction: Secondary | ICD-10-CM | POA: Diagnosis not present

## 2022-04-15 DIAGNOSIS — E876 Hypokalemia: Secondary | ICD-10-CM | POA: Diagnosis not present

## 2022-04-15 DIAGNOSIS — E785 Hyperlipidemia, unspecified: Secondary | ICD-10-CM | POA: Diagnosis not present

## 2022-04-15 DIAGNOSIS — J449 Chronic obstructive pulmonary disease, unspecified: Secondary | ICD-10-CM | POA: Diagnosis not present

## 2022-04-15 LAB — CBC WITH DIFFERENTIAL/PLATELET
Abs Immature Granulocytes: 0.02 10*3/uL (ref 0.00–0.07)
Basophils Absolute: 0 10*3/uL (ref 0.0–0.1)
Basophils Relative: 1 %
Eosinophils Absolute: 0.1 10*3/uL (ref 0.0–0.5)
Eosinophils Relative: 1 %
HCT: 42.1 % (ref 36.0–46.0)
Hemoglobin: 14.3 g/dL (ref 12.0–15.0)
Immature Granulocytes: 0 %
Lymphocytes Relative: 28 %
Lymphs Abs: 2.3 10*3/uL (ref 0.7–4.0)
MCH: 30.2 pg (ref 26.0–34.0)
MCHC: 34 g/dL (ref 30.0–36.0)
MCV: 88.8 fL (ref 80.0–100.0)
Monocytes Absolute: 0.7 10*3/uL (ref 0.1–1.0)
Monocytes Relative: 8 %
Neutro Abs: 5.2 10*3/uL (ref 1.7–7.7)
Neutrophils Relative %: 62 %
Platelets: 369 10*3/uL (ref 150–400)
RBC: 4.74 MIL/uL (ref 3.87–5.11)
RDW: 13.6 % (ref 11.5–15.5)
WBC: 8.3 10*3/uL (ref 4.0–10.5)
nRBC: 0 % (ref 0.0–0.2)

## 2022-04-15 LAB — COMPREHENSIVE METABOLIC PANEL
ALT: 15 U/L (ref 0–44)
AST: 20 U/L (ref 15–41)
Albumin: 3.1 g/dL — ABNORMAL LOW (ref 3.5–5.0)
Alkaline Phosphatase: 102 U/L (ref 38–126)
Anion gap: 9 (ref 5–15)
BUN: <5 mg/dL — ABNORMAL LOW (ref 6–20)
CO2: 24 mmol/L (ref 22–32)
Calcium: 8.3 mg/dL — ABNORMAL LOW (ref 8.9–10.3)
Chloride: 101 mmol/L (ref 98–111)
Creatinine, Ser: 0.52 mg/dL (ref 0.44–1.00)
GFR, Estimated: >60 mL/min (ref >60)
Glucose, Bld: 92 mg/dL (ref 70–99)
Potassium: 3.9 mmol/L (ref 3.5–5.1)
Sodium: 134 mmol/L — ABNORMAL LOW (ref 135–145)
Total Bilirubin: 0.7 mg/dL (ref 0.3–1.2)
Total Protein: 6.7 g/dL (ref 6.5–8.1)

## 2022-04-15 LAB — LIPASE, BLOOD: Lipase: 34 U/L (ref 11–51)

## 2022-04-15 MED ORDER — IOHEXOL 350 MG/ML SOLN
75.0000 mL | Freq: Once | INTRAVENOUS | Status: AC | PRN
  Administered 2022-04-15: 75 mL via INTRAVENOUS

## 2022-04-15 NOTE — ED Provider Triage Note (Signed)
Emergency Medicine Provider Triage Evaluation Note  ARIZONA SORN , a 56 y.o. female  was evaluated in triage.  Pt complains of abdominal pain x2 weeks.  Patient admitted to hospital from 10/13 to 10/21 due to bowel obstruction.  Patient left AGAINST MEDICAL ADVICE.  Patient returns today due to continued pain.  She admits to a small loose bowel movement. No nausea or vomiting.  Review of Systems  Positive: Abdominal pain Negative: CP  Physical Exam  BP (!) 168/89 (BP Location: Right Arm)   Pulse (!) 104   Temp 98.8 F (37.1 C) (Oral)   Resp 20   LMP 01/08/2011   SpO2 98%  Gen:   Awake, no distress   Resp:  Normal effort  MSK:   Moves extremities without difficulty  Other:  Tenderness and distention  Medical Decision Making  Medically screening exam initiated at 3:04 PM.  Appropriate orders placed.  Alejandra L Hamm was informed that the remainder of the evaluation will be completed by another provider, this initial triage assessment does not replace that evaluation, and the importance of remaining in the ED until their evaluation is complete.  Labs CT abdomen   Suzy Bouchard, PA-C 04/15/22 1506

## 2022-04-15 NOTE — ED Notes (Signed)
Pt stated that she was experiencing rectal bleeding that started 30 mins prior. ED provider was informed. This Tech attempted to take the Pt to triage for reevaluation, Pt refused and stated that she was going to [facility]. Pt seen leaving the ED.

## 2022-04-15 NOTE — ED Triage Notes (Signed)
Pt states she was seen almost two weeks ago for abd pain. She was told she had a bowel obstruction and needed emergency surgery. Pt did not want to wait in the waiting room, so she went home. Pt has been home since and in a lot of abd pain. Denies vomiting. Pt states she is still able to have small runny bowel movements. Pt states she is able to eat a little.

## 2022-04-17 ENCOUNTER — Other Ambulatory Visit: Payer: Self-pay

## 2022-04-17 ENCOUNTER — Encounter (HOSPITAL_COMMUNITY): Payer: Self-pay | Admitting: Emergency Medicine

## 2022-04-17 ENCOUNTER — Emergency Department (HOSPITAL_COMMUNITY): Payer: Medicare Other

## 2022-04-17 ENCOUNTER — Inpatient Hospital Stay (HOSPITAL_COMMUNITY): Payer: Medicare Other

## 2022-04-17 ENCOUNTER — Inpatient Hospital Stay (HOSPITAL_COMMUNITY)
Admission: EM | Admit: 2022-04-17 | Discharge: 2022-04-29 | DRG: 330 | Disposition: A | Discharge status: Home Health | Payer: Medicare Other | Attending: Internal Medicine | Admitting: Internal Medicine

## 2022-04-17 DIAGNOSIS — Z886 Allergy status to analgesic agent status: Secondary | ICD-10-CM

## 2022-04-17 DIAGNOSIS — Z5321 Procedure and treatment not carried out due to patient leaving prior to being seen by health care provider: Secondary | ICD-10-CM | POA: Diagnosis not present

## 2022-04-17 DIAGNOSIS — R569 Unspecified convulsions: Secondary | ICD-10-CM | POA: Diagnosis present

## 2022-04-17 DIAGNOSIS — Z7984 Long term (current) use of oral hypoglycemic drugs: Secondary | ICD-10-CM

## 2022-04-17 DIAGNOSIS — F250 Schizoaffective disorder, bipolar type: Secondary | ICD-10-CM | POA: Diagnosis present

## 2022-04-17 DIAGNOSIS — R509 Fever, unspecified: Secondary | ICD-10-CM | POA: Diagnosis not present

## 2022-04-17 DIAGNOSIS — E785 Hyperlipidemia, unspecified: Secondary | ICD-10-CM | POA: Diagnosis present

## 2022-04-17 DIAGNOSIS — R109 Unspecified abdominal pain: Secondary | ICD-10-CM | POA: Diagnosis not present

## 2022-04-17 DIAGNOSIS — Z8249 Family history of ischemic heart disease and other diseases of the circulatory system: Secondary | ICD-10-CM

## 2022-04-17 DIAGNOSIS — Z9884 Bariatric surgery status: Secondary | ICD-10-CM

## 2022-04-17 DIAGNOSIS — F1410 Cocaine abuse, uncomplicated: Secondary | ICD-10-CM | POA: Diagnosis present

## 2022-04-17 DIAGNOSIS — F17200 Nicotine dependence, unspecified, uncomplicated: Secondary | ICD-10-CM | POA: Diagnosis not present

## 2022-04-17 DIAGNOSIS — K56699 Other intestinal obstruction unspecified as to partial versus complete obstruction: Secondary | ICD-10-CM | POA: Diagnosis present

## 2022-04-17 DIAGNOSIS — R1032 Left lower quadrant pain: Secondary | ICD-10-CM | POA: Diagnosis not present

## 2022-04-17 DIAGNOSIS — I4891 Unspecified atrial fibrillation: Secondary | ICD-10-CM | POA: Diagnosis present

## 2022-04-17 DIAGNOSIS — Z888 Allergy status to other drugs, medicaments and biological substances status: Secondary | ICD-10-CM

## 2022-04-17 DIAGNOSIS — E039 Hypothyroidism, unspecified: Secondary | ICD-10-CM | POA: Diagnosis present

## 2022-04-17 DIAGNOSIS — G8929 Other chronic pain: Secondary | ICD-10-CM | POA: Diagnosis present

## 2022-04-17 DIAGNOSIS — K6389 Other specified diseases of intestine: Secondary | ICD-10-CM | POA: Diagnosis not present

## 2022-04-17 DIAGNOSIS — Z452 Encounter for adjustment and management of vascular access device: Secondary | ICD-10-CM | POA: Diagnosis not present

## 2022-04-17 DIAGNOSIS — Z4682 Encounter for fitting and adjustment of non-vascular catheter: Secondary | ICD-10-CM | POA: Diagnosis not present

## 2022-04-17 DIAGNOSIS — R0902 Hypoxemia: Secondary | ICD-10-CM | POA: Diagnosis not present

## 2022-04-17 DIAGNOSIS — Z794 Long term (current) use of insulin: Secondary | ICD-10-CM

## 2022-04-17 DIAGNOSIS — T8131XA Disruption of external operation (surgical) wound, not elsewhere classified, initial encounter: Secondary | ICD-10-CM | POA: Diagnosis not present

## 2022-04-17 DIAGNOSIS — K5669 Other partial intestinal obstruction: Secondary | ICD-10-CM | POA: Diagnosis not present

## 2022-04-17 DIAGNOSIS — K56609 Unspecified intestinal obstruction, unspecified as to partial versus complete obstruction: Secondary | ICD-10-CM | POA: Diagnosis not present

## 2022-04-17 DIAGNOSIS — Z825 Family history of asthma and other chronic lower respiratory diseases: Secondary | ICD-10-CM

## 2022-04-17 DIAGNOSIS — F1910 Other psychoactive substance abuse, uncomplicated: Secondary | ICD-10-CM | POA: Diagnosis present

## 2022-04-17 DIAGNOSIS — K565 Intestinal adhesions [bands], unspecified as to partial versus complete obstruction: Secondary | ICD-10-CM | POA: Diagnosis present

## 2022-04-17 DIAGNOSIS — F17210 Nicotine dependence, cigarettes, uncomplicated: Secondary | ICD-10-CM | POA: Diagnosis present

## 2022-04-17 DIAGNOSIS — Z781 Physical restraint status: Secondary | ICD-10-CM

## 2022-04-17 DIAGNOSIS — K219 Gastro-esophageal reflux disease without esophagitis: Secondary | ICD-10-CM | POA: Diagnosis present

## 2022-04-17 DIAGNOSIS — R0689 Other abnormalities of breathing: Secondary | ICD-10-CM | POA: Diagnosis not present

## 2022-04-17 DIAGNOSIS — R413 Other amnesia: Secondary | ICD-10-CM | POA: Diagnosis present

## 2022-04-17 DIAGNOSIS — Y838 Other surgical procedures as the cause of abnormal reaction of the patient, or of later complication, without mention of misadventure at the time of the procedure: Secondary | ICD-10-CM | POA: Diagnosis not present

## 2022-04-17 DIAGNOSIS — Z7951 Long term (current) use of inhaled steroids: Secondary | ICD-10-CM

## 2022-04-17 DIAGNOSIS — R1084 Generalized abdominal pain: Secondary | ICD-10-CM | POA: Diagnosis not present

## 2022-04-17 DIAGNOSIS — E1140 Type 2 diabetes mellitus with diabetic neuropathy, unspecified: Secondary | ICD-10-CM | POA: Diagnosis present

## 2022-04-17 DIAGNOSIS — Z9151 Personal history of suicidal behavior: Secondary | ICD-10-CM

## 2022-04-17 DIAGNOSIS — E876 Hypokalemia: Secondary | ICD-10-CM | POA: Diagnosis not present

## 2022-04-17 DIAGNOSIS — Z91148 Patient's other noncompliance with medication regimen for other reason: Secondary | ICD-10-CM

## 2022-04-17 DIAGNOSIS — R000 Tachycardia, unspecified: Secondary | ICD-10-CM | POA: Diagnosis not present

## 2022-04-17 DIAGNOSIS — J441 Chronic obstructive pulmonary disease with (acute) exacerbation: Secondary | ICD-10-CM | POA: Diagnosis present

## 2022-04-17 DIAGNOSIS — K660 Peritoneal adhesions (postprocedural) (postinfection): Secondary | ICD-10-CM | POA: Diagnosis not present

## 2022-04-17 DIAGNOSIS — I10 Essential (primary) hypertension: Secondary | ICD-10-CM | POA: Diagnosis not present

## 2022-04-17 DIAGNOSIS — E119 Type 2 diabetes mellitus without complications: Secondary | ICD-10-CM

## 2022-04-17 DIAGNOSIS — R739 Hyperglycemia, unspecified: Secondary | ICD-10-CM | POA: Diagnosis not present

## 2022-04-17 DIAGNOSIS — J449 Chronic obstructive pulmonary disease, unspecified: Secondary | ICD-10-CM | POA: Diagnosis present

## 2022-04-17 DIAGNOSIS — K5651 Intestinal adhesions [bands], with partial obstruction: Secondary | ICD-10-CM | POA: Diagnosis not present

## 2022-04-17 DIAGNOSIS — F259 Schizoaffective disorder, unspecified: Secondary | ICD-10-CM | POA: Diagnosis present

## 2022-04-17 DIAGNOSIS — F419 Anxiety disorder, unspecified: Secondary | ICD-10-CM | POA: Diagnosis present

## 2022-04-17 DIAGNOSIS — T8130XA Disruption of wound, unspecified, initial encounter: Secondary | ICD-10-CM | POA: Diagnosis not present

## 2022-04-17 DIAGNOSIS — Z833 Family history of diabetes mellitus: Secondary | ICD-10-CM

## 2022-04-17 DIAGNOSIS — F10139 Alcohol abuse with withdrawal, unspecified: Secondary | ICD-10-CM | POA: Diagnosis not present

## 2022-04-17 DIAGNOSIS — Z79899 Other long term (current) drug therapy: Secondary | ICD-10-CM

## 2022-04-17 LAB — CBC WITH DIFFERENTIAL/PLATELET
Abs Immature Granulocytes: 0.03 10*3/uL (ref 0.00–0.07)
Basophils Absolute: 0 10*3/uL (ref 0.0–0.1)
Basophils Relative: 0 %
Eosinophils Absolute: 0.1 10*3/uL (ref 0.0–0.5)
Eosinophils Relative: 1 %
HCT: 38.1 % (ref 36.0–46.0)
Hemoglobin: 12.9 g/dL (ref 12.0–15.0)
Immature Granulocytes: 0 %
Lymphocytes Relative: 37 %
Lymphs Abs: 2.7 10*3/uL (ref 0.7–4.0)
MCH: 30 pg (ref 26.0–34.0)
MCHC: 33.9 g/dL (ref 30.0–36.0)
MCV: 88.6 fL (ref 80.0–100.0)
Monocytes Absolute: 0.5 10*3/uL (ref 0.1–1.0)
Monocytes Relative: 7 %
Neutro Abs: 4 10*3/uL (ref 1.7–7.7)
Neutrophils Relative %: 55 %
Platelets: 352 10*3/uL (ref 150–400)
RBC: 4.3 MIL/uL (ref 3.87–5.11)
RDW: 13.5 % (ref 11.5–15.5)
WBC: 7.3 10*3/uL (ref 4.0–10.5)
nRBC: 0 % (ref 0.0–0.2)

## 2022-04-17 LAB — PROTIME-INR
INR: 1.1 (ref 0.8–1.2)
Prothrombin Time: 14.4 seconds (ref 11.4–15.2)

## 2022-04-17 LAB — COMPREHENSIVE METABOLIC PANEL
ALT: 13 U/L (ref 0–44)
AST: 16 U/L (ref 15–41)
Albumin: 2.7 g/dL — ABNORMAL LOW (ref 3.5–5.0)
Alkaline Phosphatase: 88 U/L (ref 38–126)
Anion gap: 8 (ref 5–15)
BUN: <5 mg/dL — ABNORMAL LOW (ref 6–20)
CO2: 25 mmol/L (ref 22–32)
Calcium: 7.9 mg/dL — ABNORMAL LOW (ref 8.9–10.3)
Chloride: 102 mmol/L (ref 98–111)
Creatinine, Ser: 0.49 mg/dL (ref 0.44–1.00)
GFR, Estimated: >60 mL/min (ref >60)
Glucose, Bld: 94 mg/dL (ref 70–99)
Potassium: 3 mmol/L — ABNORMAL LOW (ref 3.5–5.1)
Sodium: 135 mmol/L (ref 135–145)
Total Bilirubin: 0.4 mg/dL (ref 0.3–1.2)
Total Protein: 5.8 g/dL — ABNORMAL LOW (ref 6.5–8.1)

## 2022-04-17 LAB — LACTIC ACID, PLASMA
Lactic Acid, Venous: 1.9 mmol/L (ref 0.5–1.9)
Lactic Acid, Venous: 2 mmol/L (ref 0.5–1.9)
Lactic Acid, Venous: 1.8 mmol/L (ref 0.5–1.9)

## 2022-04-17 LAB — PREALBUMIN: Prealbumin: 10 mg/dL — ABNORMAL LOW (ref 18–38)

## 2022-04-17 LAB — LIPASE, BLOOD: Lipase: 40 U/L (ref 11–51)

## 2022-04-17 LAB — MAGNESIUM: Magnesium: 1.7 mg/dL (ref 1.7–2.4)

## 2022-04-17 MED ORDER — LACTATED RINGERS IV SOLN: INTRAVENOUS | Status: DC

## 2022-04-17 MED ORDER — PANTOPRAZOLE SODIUM 40 MG IV SOLR
40.0000 mg | INTRAVENOUS | Status: DC
  Administered 2022-04-17 – 2022-04-23 (×7): 40 mg via INTRAVENOUS
  Filled 2022-04-17 (×7): qty 10

## 2022-04-17 MED ORDER — SODIUM CHLORIDE 0.9 % IV SOLN
1.0000 mg | Freq: Every day | INTRAVENOUS | Status: DC
  Administered 2022-04-18 – 2022-04-23 (×6): 1 mg via INTRAVENOUS
  Filled 2022-04-17 (×7): qty 0.2

## 2022-04-17 MED ORDER — HYDROMORPHONE HCL 1 MG/ML IJ SOLN
0.5000 mg | Freq: Once | INTRAMUSCULAR | Status: AC
  Administered 2022-04-17: 0.5 mg via INTRAVENOUS
  Filled 2022-04-17: qty 1

## 2022-04-17 MED ORDER — ONDANSETRON HCL 4 MG/2ML IJ SOLN
4.0000 mg | Freq: Once | INTRAMUSCULAR | Status: AC
  Administered 2022-04-17: 4 mg via INTRAVENOUS
  Filled 2022-04-17: qty 2

## 2022-04-17 MED ORDER — HYDROMORPHONE HCL 1 MG/ML IJ SOLN
0.5000 mg | INTRAMUSCULAR | Status: DC | PRN
  Administered 2022-04-17: 0.5 mg via INTRAVENOUS
  Filled 2022-04-17: qty 1

## 2022-04-17 MED ORDER — IOHEXOL 350 MG/ML SOLN
80.0000 mL | Freq: Once | INTRAVENOUS | Status: AC | PRN
  Administered 2022-04-17: 80 mL via INTRAVENOUS

## 2022-04-17 MED ORDER — NICOTINE 21 MG/24HR TD PT24
21.0000 mg | MEDICATED_PATCH | Freq: Every day | TRANSDERMAL | Status: DC
  Administered 2022-04-17 – 2022-04-29 (×11): 21 mg via TRANSDERMAL
  Filled 2022-04-17 (×14): qty 1

## 2022-04-17 MED ORDER — ACETAMINOPHEN 325 MG PO TABS: 650.0000 mg | ORAL_TABLET | Freq: Four times a day (QID) | ORAL | Status: DC | PRN

## 2022-04-17 MED ORDER — ONDANSETRON HCL 4 MG/2ML IJ SOLN
4.0000 mg | Freq: Four times a day (QID) | INTRAMUSCULAR | Status: DC | PRN
  Administered 2022-04-17: 4 mg via INTRAVENOUS
  Filled 2022-04-17: qty 2

## 2022-04-17 MED ORDER — MAGNESIUM SULFATE IN D5W 1-5 GM/100ML-% IV SOLN
1.0000 g | Freq: Once | INTRAVENOUS | Status: AC
  Administered 2022-04-17: 1 g via INTRAVENOUS
  Filled 2022-04-17: qty 100

## 2022-04-17 MED ORDER — THIAMINE HCL 100 MG/ML IJ SOLN
100.0000 mg | Freq: Every day | INTRAMUSCULAR | Status: DC
  Administered 2022-04-17 – 2022-04-23 (×7): 100 mg via INTRAVENOUS
  Filled 2022-04-17 (×7): qty 2

## 2022-04-17 MED ORDER — ONDANSETRON HCL 4 MG PO TABS
4.0000 mg | ORAL_TABLET | Freq: Four times a day (QID) | ORAL | Status: DC | PRN
  Administered 2022-04-25: 4 mg via ORAL
  Filled 2022-04-17: qty 1

## 2022-04-17 MED ORDER — ACETAMINOPHEN 650 MG RE SUPP: 650.0000 mg | Freq: Four times a day (QID) | RECTAL | Status: DC | PRN

## 2022-04-17 MED ORDER — LORAZEPAM 1 MG PO TABS: 1.0000 mg | ORAL_TABLET | ORAL | Status: AC | PRN

## 2022-04-17 MED ORDER — LORAZEPAM 2 MG/ML IJ SOLN
1.0000 mg | INTRAMUSCULAR | Status: AC | PRN
  Administered 2022-04-18 – 2022-04-20 (×7): 2 mg via INTRAVENOUS
  Filled 2022-04-17 (×7): qty 1

## 2022-04-17 MED ORDER — MORPHINE SULFATE (PF) 2 MG/ML IV SOLN
2.0000 mg | INTRAVENOUS | Status: DC | PRN
  Administered 2022-04-17 (×2): 2 mg via INTRAVENOUS
  Filled 2022-04-17 (×2): qty 1

## 2022-04-17 MED ORDER — POTASSIUM CHLORIDE 10 MEQ/100ML IV SOLN
10.0000 meq | INTRAVENOUS | Status: AC
  Administered 2022-04-17 (×4): 10 meq via INTRAVENOUS
  Filled 2022-04-17 (×4): qty 100

## 2022-04-17 MED ORDER — LACTATED RINGERS IV BOLUS
1000.0000 mL | Freq: Once | INTRAVENOUS | Status: AC
  Administered 2022-04-17: 1000 mL via INTRAVENOUS

## 2022-04-17 MED ORDER — THIAMINE MONONITRATE 100 MG PO TABS
100.0000 mg | ORAL_TABLET | Freq: Every day | ORAL | Status: DC
  Administered 2022-04-24 – 2022-04-29 (×6): 100 mg via ORAL
  Filled 2022-04-17 (×6): qty 1

## 2022-04-17 MED ORDER — MORPHINE SULFATE (PF) 4 MG/ML IV SOLN
4.0000 mg | Freq: Once | INTRAVENOUS | Status: AC
  Administered 2022-04-17: 4 mg via INTRAVENOUS
  Filled 2022-04-17: qty 1

## 2022-04-17 NOTE — Assessment & Plan Note (Signed)
daughter states that since her OD this summer she has had worsening memory decline She plans on outpatient neurology work up

## 2022-04-17 NOTE — Progress Notes (Signed)
[facility] Progress Note     Subjective: 56 year old female known to our service who was admitted to the [facility] hospitalist service from 10/13-10/20 for SBO.  She had been treated conservatively with NGT when she left 10/20 AMA - her NGT had been removed but obstruction was not completely resolved and replacement was recommended. She did not want replacement and was wanting to eat and therefore left AMA. Since then she has had ongoing distension and abdominal pain with intermittent nausea but no emesis.  She presented to the emergency department on 10/24 and had repeat imaging at that time but left prior to formal evaluation due to long wait time. She has been able to eat only a very small amount (a few bites of chicken and a sandwich).  She has been drinking soda, water and beer daily.  She last ate a few bites of chicken last night.  She has increasing abdominal pain with p.o. intake but remains hungry.  She did have loose bowel movements overnight and this morning.  She denies fever, chills, shortness of breath, chest pain, urinary symptoms.  Her daughter [name] is bedside.   Objective: Vital signs in last 24 hours: Temp:  [98.4 F (36.9 C)-98.5 F (36.9 C)] 98.5 F (36.9 C) (10/26 1320) Pulse Rate:  [86-100] 86 (10/26 1345) Resp:  [14-23] 22 (10/26 1345) BP: (135-187)/(80-113) 156/93 (10/26 1345) SpO2:  [95 %-98 %] 96 % (10/26 1345) Weight:  [49.4 kg] 49.4 kg (10/26 0937)    Intake/Output from previous day: No intake/output data recorded. Intake/Output this shift: No intake/output data recorded.  PE: Gen:  Alert, NAD Resp: Respiratory effort unlabored on room air.  CTAB Abd: Moderately distended and diffusely tender to palpation with voluntary guarding.  Hypoactive bowel sounds. MSK: MAE, calf soft and nontender to palpation Psych: Alert and oriented x3  Lab Results:  Recent Labs    04/15/22 1511 04/17/22 1018  WBC 8.3 7.3  HGB 14.3 12.9  HCT 42.1 38.1  PLT  369 352    BMET Recent Labs    04/15/22 1511 04/17/22 1018  NA 134* 135  K 3.9 3.0*  CL 101 102  CO2 24 25  GLUCOSE 92 94  BUN <5* <5*  CREATININE 0.52 0.49  CALCIUM 8.3* 7.9*    PT/INR No results for input(s): "LABPROT", "INR" in the last 72 hours. CMP     Component Value Date/Time   NA 135 04/17/2022 1018   K 3.0 (L) 04/17/2022 1018   CL 102 04/17/2022 1018   CO2 25 04/17/2022 1018   GLUCOSE 94 04/17/2022 1018   BUN <5 (L) 04/17/2022 1018   CREATININE 0.49 04/17/2022 1018   CREATININE 0.57 03/28/2021 0000   CALCIUM 7.9 (L) 04/17/2022 1018   PROT 5.8 (L) 04/17/2022 1018   ALBUMIN 2.7 (L) 04/17/2022 1018   AST 16 04/17/2022 1018   ALT 13 04/17/2022 1018   ALKPHOS 88 04/17/2022 1018   BILITOT 0.4 04/17/2022 1018   GFRNONAA >60 04/17/2022 1018   GFRAA >60 08/03/2018 1514   Lipase     Component Value Date/Time   LIPASE 40 04/17/2022 1018       Studies/Results: CT ABDOMEN PELVIS W CONTRAST  Result Date: 04/17/2022 CLINICAL DATA:  Small bowel obstruction. EXAM: CT ABDOMEN AND PELVIS WITH CONTRAST TECHNIQUE: Multidetector CT imaging of the abdomen and pelvis was performed using the standard protocol following bolus administration of intravenous contrast. RADIATION DOSE REDUCTION: This exam was performed according to the departmental dose-optimization  program which includes automated exposure control, adjustment of the mA and/or kV according to patient size and/or use of iterative reconstruction technique. COMPARISON:  04/15/2022 FINDINGS: Lower chest: Unremarkable. Hepatobiliary: Insert normal infused liver There is no evidence for gallstones, gallbladder wall thickening, or pericholecystic fluid. No intrahepatic or extrahepatic biliary dilation. Pancreas: No focal mass lesion. No dilatation of the main duct. No intraparenchymal cyst. No peripancreatic edema. Spleen: No splenomegaly. No focal mass lesion. Adrenals/Urinary Tract: No adrenal nodule or mass. Kidneys  unremarkable. No evidence for hydroureter. The urinary bladder appears normal for the degree of distention. Stomach/Bowel: Status post gastric bypass. Similar appearance of diffuse small bowel distension. Terminal ileum is decompressed. There is mesenteric edema in the lower abdomen and pelvis with scattered areas of interloop mesenteric fluid. Small bowel loop in the left pelvis shows fecalized enteric contents and is distended up to 3.9 cm diameter. Multiple nondilated loops in the right abdomen show apparent mural edema/thickening. Old no evidence for pneumatosis. Colon is diffusely distended with gas and stool. Vascular/Lymphatic: There is mild atherosclerotic calcification of the abdominal aorta without aneurysm. There is no gastrohepatic or hepatoduodenal ligament lymphadenopathy. No retroperitoneal or mesenteric lymphadenopathy. No pelvic sidewall lymphadenopathy. Reproductive: No adnexal mass. Other: Small volume free fluid seen in the pelvis. Musculoskeletal: Small bilateral groin hernias contain only fat. No worrisome lytic or sclerotic osseous abnormality. T9 compression deformity is stable. IMPRESSION: 1. Similar appearance of diffuse small bowel distension with mesenteric edema in the lower abdomen and pelvis with scattered areas of interloop mesenteric fluid. Small bowel loop in the left pelvis shows fecalized enteric contents and is distended up to 3.9 cm diameter. Multiple nondilated loops in the right abdomen show apparent mural edema/thickening. As before, assessment is limited by lack of intra-abdominal fat. Imaging features could be related to ileus although distal small bowel obstruction cannot be excluded. No evidence for pneumatosis in the small bowel or colon. No intraperitoneal free air. No evidence for intraperitoneal abscess. 2. Small volume free fluid in the pelvis. 3. Status post gastric bypass. 4. Stable T9 compression deformity. 5.  Aortic Atherosclerosis (ICD10-I70.0). Electronically  Signed   By: Misty Stanley M.D.   On: 04/17/2022 13:02   CT ABDOMEN PELVIS W CONTRAST  Result Date: 04/15/2022 CLINICAL DATA:  Bowel obstruction suspected. EXAM: CT ABDOMEN AND PELVIS WITH CONTRAST TECHNIQUE: Multidetector CT imaging of the abdomen and pelvis was performed using the standard protocol following bolus administration of intravenous contrast. RADIATION DOSE REDUCTION: This exam was performed according to the departmental dose-optimization program which includes automated exposure control, adjustment of the mA and/or kV according to patient size and/or use of iterative reconstruction technique. CONTRAST:  58m OMNIPAQUE IOHEXOL 350 MG/ML SOLN COMPARISON:  Multiple priors including CT abdomen pelvis April 11, 2022. FINDINGS: Lower chest: No acute abnormality. Hepatobiliary: No suspicious hepatic lesion. Gallbladder is nondistended. No biliary ductal dilation. Pancreas: No pancreatic ductal dilation or evidence of acute inflammation. Spleen: No splenomegaly. Adrenals/Urinary Tract: Bilateral adrenal glands appear normal. No hydronephrosis. Kidneys demonstrate symmetric enhancement. Urinary bladder is unremarkable for degree of distension. Stomach/Bowel: Small hiatal hernia. Surgical changes of gastric bypass. Dilated loops of large and small bowel throughout the abdomen with decompressed descending colon and rectum. No abrupt transition point to decompressed bowel identified. Fecalized loops of small bowel with large volume of formed stool in the proximal/mid colon. Vascular/Lymphatic: Aortic atherosclerosis. No pathologically enlarged abdominal or pelvic lymph nodes. Reproductive: No acute abnormality. Other: Small volume pelvic free fluid. Surgical change in the anterior abdominal  wall. Soft tissue nodule in the right subcutaneous abdominal wall is similar prior and may reflect sequela of subcutaneous injections. Nonspecific body wall edema is unchanged. Musculoskeletal: No acute osseous  abnormality. IMPRESSION: 1. Dilated loops of large and small bowel throughout the abdomen with decompressed descending colon and rectum. There are multiple interposed loops of dilated/distended and mildly dilated/decompressed of bowel throughout the abdomen without abrupt transition point to decompressed bowel identified, although admittedly evaluation is difficult given the relative paucity of peritoneal fat and extensive distension/dilation of bowel. Findings are favored to reflect ileus. However, early/partial bowel obstruction is not excluded. Consider repeat CT imaging with oral contrast medium to aid in evaluation. 2. Fecalized loops of small bowel as can be seen with slow transit with large volume of stool in the proximal/mid colon suggestive of constipation. 3. Small volume pelvic free fluid, likely reactive. 4.  Aortic Atherosclerosis (ICD10-I70.0). Electronically Signed   By: Dahlia Bailiff M.D.   On: 04/15/2022 18:03    Anti-infectives: Anti-infectives (From admission, onward)    None        Assessment/Plan SBO H/o gastric bypass 2017 Dr. Excell Seltzer, history of laparoscopic hysterectomy - CT 10/13 w/ SBO and TP poster pelvis. Mild twisting of mesentery within RLQ. Small HH -Admitted to hospitalist service 10/13 through 10/20 for SBO managed with conservative treatment.  NG tube removed during admission but obstruction did not completely resolve.  She left AMA 10/20 after recommendation for continued n.p.o. status and NG tube replacement -CT 10/24 and today (10/26) done without oral contrast showing diffuse small bowel distention concerning for ileus versus SBO however fever pSBO given history -Recommend decompression with NG tube.  Have asked nurse to place.  She has had difficult placement in the past and will ask for radiology assistance if nurse unable to place -Remain n.p.o. with likely OR for ex lap tomorrow given she has failed conservative management  She is being admitted to the  hospital service.  We will follow with you   FEN: NPO, NGT LIWS, IVF per primary ID: none VTE: Okay for chemical prophylaxis from surgical perspective   Alcohol use Tobacco use DM GERD COPD Afib  HTN   I reviewed ED provider notes, last 24 h vitals and pain scores, last 48 h intake and output, last 24 h labs and trends, and last 24 h imaging results.     LOS: 0 days    [facility] 04/17/2022, 2:01 PM Please see Amion for pager number during day hours 7:00am-4:30pm

## 2022-04-17 NOTE — Assessment & Plan Note (Signed)
56 year old female with history of SBO who left AMA on 10/21 presenting with worsening abdominal pain over last 2 days found to have continued SBO -admit to progressive -general surgery consulted, NG tube placed. Likely surgery tomorrow -at time of my exam she has rebound and worsening pain after NG placement. Discussed with surgery so they are aware. Ordered repeat lactic acid stat. Will start NG suction and PA will discuss with surgeon  -Gentle IV fluid hydration -Pain medication -encourage ambulation -Monitor electrolytes

## 2022-04-17 NOTE — ED Notes (Signed)
Critical lactic acid 2.0  MD Branham notified and aware.  No new orders at this time.

## 2022-04-17 NOTE — Assessment & Plan Note (Addendum)
PRN IV labetalol until no longer NPO  Has not been compliant with medication

## 2022-04-17 NOTE — H&P (Signed)
History and Physical    Patient: [name] PYP:[id number] DOB: [birth year] DOA: 04/17/2022 DOS: the patient was seen and examined on 04/17/2022 PCP: [name], MD  Patient coming from: Home - her brother rents a room at house. Her daughter is a care taker.    Chief Complaint: abdominal pain   HPI: [name] is a 55 y.o. female with medical history significant of  Schizo affective d/o , COPD, Anemia, GERD, HTN, gastric bypass in 2017 by Dr. Hillary Bow recent interim history of  seizure s/p fall diagnosed 8/23.  In addition to history of  admission 7/23-8/23 for suicide attempt complicated by aspiration pneumonia with acute respiratory failure that was vent dependent. Had trach placed during this stay.  During this admission patient also developed colonic ileus and was seen by GI and placed on Linzess and lactulose.She was recently admitted on on 10/13-10/21 for SBO and was treated conservatively with small bowel protocol. She had persistent obstruction and left AMA on 10/21. She now presents to ED with complaints of abdominal pain. She states her pain never went away after she left the hospital on 10/21 and it got acutely worse last night. It was 7/9 last night and then 9/9 this AM. She describes it as stabbing, aching and moves around. Pain is generalized and radiates to the lower back. Nothing makes it better or worse. Pain is constant. She had a runny BM this morning. She has not had a normal BM since discharge and they have all been running. She states she may have blood in her stool. She has been eating and has no N/V.   She smokes 1.5 PPD and 40 1/2 ounces/day   Her daughter tells me she has not been taking any of her medication.    Denies any fever/chills, vision changes/headaches, chest pain or palpitations, shortness of breath or cough,  dysuria or leg swelling.    ER Course:  vitals: afebrile, bp: 144/88, HR: 100, RR: 14, oxygen: 98%RA Pertinent labs: potassium: 3.0,   CT abdomen/pelvis: similar appearance of diffuse small bowel distention with mesenteric edema in lower abdomen and pelvis with scattered areas of interloop mesenteric fluid. Small bowel loop in the left pelvis shows fecalized enteric contents and is distended up to 3.9 cm diameter. Multiple nondilated loops in the right abdomen show apparent mural edema/thickening. As before, assessment is limited by lack of intra-abdominal fat. Imaging features could be related to ileus although distal small bowel obstruction cannot be excluded. No evidence for pneumatosis in the small bowel or colon. No intraperitoneal free air. No evidence for intraperitoneal abscess. Small volume free fluid in the pelvis. S/p gastric bypass. Stable T9 compression deformity.  In ED: general surgery consulted. Given pain medication, 1L IVF and potassium repletion. TRH asked to admit.      Review of Systems: As mentioned in the history of present illness. All other systems reviewed and are negative. Past Medical History:  Diagnosis Date   Abdominal pain    Accidental drug overdose April 2013   Anxiety    Atrial fibrillation ([place]) 09/29/11   converted spontaneously   Chronic back pain    Chronic knee pain    Chronic nausea    Chronic pain    COPD (chronic obstructive pulmonary disease) ([place])    Depression    Diabetes mellitus    states her doctor took her off all DM meds in past month   Diabetic neuropathy ([place])    Dyspnea    with exertion  GERD (gastroesophageal reflux disease)    Headache(784.0)    migraines    HTN (hypertension)    not on meds since in a year    Hyperlipidemia    Hypothyroidism    not on meds in a while    Mental disorder    Bipolar and schizophrenic   Requires supplemental oxygen    as needed per patient    Schizophrenia ([place])    Schizophrenia, acute ([place]) 11/13/2017   Tobacco abuse    Past Surgical History:  Procedure Laterality Date   ABDOMINAL HYSTERECTOMY     BLADDER  SUSPENSION  03/04/2011   Procedure: [facility] PROCEDURE;  Surgeon: Elayne Snare MacDiarmid;  Location: [facility];  Service: Urology;  Laterality: N/A;   CYSTOCELE REPAIR  03/04/2011   Procedure: ANTERIOR REPAIR (CYSTOCELE);  Surgeon: Reece Packer;  Location: [facility];  Service: Urology;  Laterality: N/A;   CYSTOSCOPY  03/04/2011   Procedure: CYSTOSCOPY;  Surgeon: Elayne Snare MacDiarmid;  Location: [facility];  Service: Urology;  Laterality: N/A;   ESOPHAGOGASTRODUODENOSCOPY (EGD) WITH PROPOFOL N/A 05/12/2017   Procedure: ESOPHAGOGASTRODUODENOSCOPY (EGD) WITH PROPOFOL;  Surgeon: Alphonsa Overall, MD;  Location: [facility];  Service: General;  Laterality: N/A;   GASTRIC ROUX-EN-Y N/A 03/25/2016   Procedure: LAPAROSCOPIC ROUX-EN-Y GASTRIC BYPASS WITH UPPER ENDOSCOPY;  Surgeon: Excell Seltzer, MD;  Location: [facility];  Service: General;  Laterality: N/A;   KNEE SURGERY     LAPAROSCOPIC ASSISTED VAGINAL HYSTERECTOMY  03/04/2011   Procedure: LAPAROSCOPIC ASSISTED VAGINAL HYSTERECTOMY;  Surgeon: Cyril Mourning, MD;  Location: [facility];  Service: Gynecology;  Laterality: N/A;   Social History:  reports that she has been smoking cigarettes. She has been smoking an average of .5 packs per day. She has never used smokeless tobacco. She reports current alcohol use. She reports current drug use. Drugs: Cocaine, Marijuana, and "Crack" cocaine.  Allergies  Allergen Reactions   Iron Dextran Shortness Of Breath and Anxiety   Aspirin Nausea And Vomiting and Other (See Comments)    Ok to take tylenol or ibuprofen     Family History  Problem Relation Age of Onset   Heart attack Father        60s   Diabetes Mother    Heart disease Mother    Hypertension Mother    Heart attack Sister        39   COPD Other    Breast cancer Neg Hx     Prior to Admission medications   Medication Sig Start Date End Date Taking? Authorizing Provider  albuterol (PROAIR HFA) 108 (90 Base) MCG/ACT inhaler Inhale 2 puffs into the lungs every 6  (six) hours as needed for wheezing or shortness of breath. 11/16/17   Lindell Spar I, NP  amLODipine (NORVASC) 5 MG tablet Take 1 tablet (5 mg total) by mouth daily. 02/12/22 04/06/23  Elodia Florence., MD  ARIPiprazole (ABILIFY) 5 MG tablet Take 1 tablet (5 mg total) by mouth 2 (two) times daily. 02/12/22 04/06/23  Elodia Florence., MD  ascorbic acid (VITAMIN C) 500 MG tablet Take 1 tablet (500 mg total) by mouth daily. 02/12/22   Elodia Florence., MD  Cholecalciferol 25 MCG (1000 UT) capsule Take 1 capsule (1,000 Units total) by mouth daily. 08/03/20   Florencia Reasons, MD  folic acid (FOLVITE) 1 MG tablet Take 1 tablet (1 mg total) by mouth daily. Patient not taking: Reported on 04/05/2022 02/12/22   Elodia Florence., MD  gabapentin (  NEURONTIN) 300 MG capsule Take 1 capsule (300 mg total) by mouth daily AND 2 capsules (600 mg total) at bedtime. Patient taking differently: Take 1 capsule (300 mg total) by mouth daily.  02/12/22 04/06/23  Elodia Florence., MD  ibuprofen (ADVIL) 800 MG tablet Take 800 mg by mouth 3 (three) times daily as needed for pain. 12/31/21   [provider]  insulin aspart (NOVOLOG) 100 UNIT/ML injection Inject 0-15 Units into the skin 3 (three) times daily with meals. CBG < 70: treat hypoglycemia   CBG 70 - 120: 0 units  CBG 121 - 150: 2 units  CBG 151 - 200: 3 units  CBG 201 - 250: 5 units  CBG 251 - 300: 8 units  CBG 301 - 350: 11 units  CBG 351 - 400: 15 units  CBG > 400: call MD Patient not taking: Reported on 04/05/2022 02/12/22   Elodia Florence., MD  ipratropium-albuterol (DUONEB) 0.5-2.5 (3) MG/3ML SOLN Take 3 mLs by nebulization every 6 (six) hours as needed. Patient not taking: Reported on 04/05/2022 02/12/22   Elodia Florence., MD  lactulose (CHRONULAC) 10 GM/15ML solution Take 45 mLs (30 g total) by mouth 2 (two) times daily. 02/12/22   Elodia Florence., MD  metFORMIN (GLUCOPHAGE) 500 MG tablet Take 1 tablet (500 mg  total) by mouth 2 (two) times daily with a meal. Patient not taking: Reported on 04/05/2022 11/16/21 12/16/21  Thurnell Lose, MD  pantoprazole (PROTONIX) 40 MG tablet Take 1 tablet (40 mg total) by mouth daily. 11/16/21   Thurnell Lose, MD  PARoxetine (PAXIL) 20 MG tablet Take 20 mg by mouth every morning. 03/14/22   [provider]  QUEtiapine (SEROQUEL) 400 MG tablet Take 400 mg by mouth at bedtime. 03/14/22   [provider]  SYMBICORT 160-4.5 MCG/ACT inhaler Inhale 2 puffs into the lungs daily as needed (sob and wheezing). Patient not taking: Reported on 11/13/2021 11/16/17   Lindell Spar I, NP    Physical Exam: Vitals:   04/17/22 1320 04/17/22 1345 04/17/22 1400 04/17/22 1705  BP:  (!) 156/93 (!) 134/93   Pulse:  86 91   Resp:  (!) 22 19   Temp: 98.5 F (36.9 C)   98.7 F (37.1 C)  TempSrc: Oral   Oral  SpO2:  96% 95%   Weight:      Height:       General:  Appears calm and comfortable and in mild distress.  Eyes:  PERRL, EOMI, normal lids, iris ENT:  grossly normal hearing, lips & tongue, mmm; appropriate dentition Neck:  no LAD, masses or thyromegaly; no carotid bruits Cardiovascular:  RRR, no m/r/g. No LE edema.  Respiratory:   CTA bilaterally with no wheezes/rales/rhonchi.  Normal respiratory effort. Abdomen:  soft, distended. +Rebound and guarding. Absent BS.  Back:   normal alignment, no CVAT Skin:  no rash or induration seen on limited exam Musculoskeletal:  grossly normal tone BUE/BLE, good ROM, no bony abnormality Lower extremity:  No LE edema.  Limited foot exam with no ulcerations.  2+ distal pulses. Psychiatric:  grossly normal mood and affect, speech fluent and appropriate, AOx3 Neurologic:  CN 2-12 grossly intact, moves all extremities in coordinated fashion, sensation intact   Radiological Exams on Admission: Independently reviewed - see discussion in A/P where applicable  DG Abd Portable 1V  Result Date: 04/17/2022 CLINICAL DATA:   Enteric catheter placement EXAM: PORTABLE ABDOMEN - 1 VIEW COMPARISON:  04/11/2022  FINDINGS: Frontal view of the lower chest and upper abdomen demonstrates enteric catheter passing below diaphragm, tip projecting over the gastric body and side port projecting at the gastroesophageal junction. Persistent gaseous distention of the bowel, with slight decrease in caliber since prior x-ray. Lung bases are clear. IMPRESSION: 1. Enteric catheter tip projecting over the gastric body. Electronically Signed   By: Randa Ngo M.D.   On: 04/17/2022 16:09   CT ABDOMEN PELVIS W CONTRAST  Result Date: 04/17/2022 CLINICAL DATA:  Small bowel obstruction. EXAM: CT ABDOMEN AND PELVIS WITH CONTRAST TECHNIQUE: Multidetector CT imaging of the abdomen and pelvis was performed using the standard protocol following bolus administration of intravenous contrast. RADIATION DOSE REDUCTION: This exam was performed according to the departmental dose-optimization program which includes automated exposure control, adjustment of the mA and/or kV according to patient size and/or use of iterative reconstruction technique. COMPARISON:  04/15/2022 FINDINGS: Lower chest: Unremarkable. Hepatobiliary: Insert normal infused liver There is no evidence for gallstones, gallbladder wall thickening, or pericholecystic fluid. No intrahepatic or extrahepatic biliary dilation. Pancreas: No focal mass lesion. No dilatation of the main duct. No intraparenchymal cyst. No peripancreatic edema. Spleen: No splenomegaly. No focal mass lesion. Adrenals/Urinary Tract: No adrenal nodule or mass. Kidneys unremarkable. No evidence for hydroureter. The urinary bladder appears normal for the degree of distention. Stomach/Bowel: Status post gastric bypass. Similar appearance of diffuse small bowel distension. Terminal ileum is decompressed. There is mesenteric edema in the lower abdomen and pelvis with scattered areas of interloop mesenteric fluid. Small bowel loop in the  left pelvis shows fecalized enteric contents and is distended up to 3.9 cm diameter. Multiple nondilated loops in the right abdomen show apparent mural edema/thickening. Old no evidence for pneumatosis. Colon is diffusely distended with gas and stool. Vascular/Lymphatic: There is mild atherosclerotic calcification of the abdominal aorta without aneurysm. There is no gastrohepatic or hepatoduodenal ligament lymphadenopathy. No retroperitoneal or mesenteric lymphadenopathy. No pelvic sidewall lymphadenopathy. Reproductive: No adnexal mass. Other: Small volume free fluid seen in the pelvis. Musculoskeletal: Small bilateral groin hernias contain only fat. No worrisome lytic or sclerotic osseous abnormality. T9 compression deformity is stable. IMPRESSION: 1. Similar appearance of diffuse small bowel distension with mesenteric edema in the lower abdomen and pelvis with scattered areas of interloop mesenteric fluid. Small bowel loop in the left pelvis shows fecalized enteric contents and is distended up to 3.9 cm diameter. Multiple nondilated loops in the right abdomen show apparent mural edema/thickening. As before, assessment is limited by lack of intra-abdominal fat. Imaging features could be related to ileus although distal small bowel obstruction cannot be excluded. No evidence for pneumatosis in the small bowel or colon. No intraperitoneal free air. No evidence for intraperitoneal abscess. 2. Small volume free fluid in the pelvis. 3. Status post gastric bypass. 4. Stable T9 compression deformity. 5.  Aortic Atherosclerosis (ICD10-I70.0). Electronically Signed   By: Misty Stanley M.D.   On: 04/17/2022 13:02     Labs on Admission: I have personally reviewed the available labs and imaging studies at the time of the admission.  Pertinent labs:   Potassium: 3.0   Assessment and Plan: Principal Problem:   Small bowel obstruction (HCC) Active Problems:   Hypokalemia   Polysubstance abuse (HCC)    Schizoaffective disorder, bipolar type (HCC)   Type 2 diabetes mellitus (HCC)   Essential hypertension   COPD (chronic obstructive pulmonary disease) (HCC)   Seizure (HCC)   GERD (gastroesophageal reflux disease)   Memory loss  Assessment and Plan: * Small bowel obstruction ([place]) 56 year old female with history of SBO who left AMA on 10/21 presenting with worsening abdominal pain over last 2 days found to have continued SBO -admit to progressive -general surgery consulted, NG tube placed. Likely surgery tomorrow -at time of my exam she has rebound and worsening pain after NG placement. Discussed with surgery so they are aware. Ordered repeat lactic acid stat. Will start NG suction and PA will discuss with surgeon  -Gentle IV fluid hydration -Pain medication -encourage ambulation -Monitor electrolytes  Hypokalemia repleted in ED x 45mq Mag wnl Trend   Polysubstance abuse ([place] +cocaine and alcohol abuse -UDS pending -CIWA protocol with IV thiamine and IV folic acid while NPO   Schizoaffective disorder, bipolar type ([place] History of suicide attempt in 12/2021 Has not been taking medication per family No SI/HI.   Type 2 diabetes mellitus (HCC) A1C of 6.5 this month Hold metformin Sensitive SSI and accuchecks qac/hs while NPO   Essential hypertension PRN IV labetalol until no longer NPO  Has not been compliant with medication   COPD (chronic obstructive pulmonary disease) (HCC) No s/s of exacerbation Continue SABA prn and symbicort   Seizure ([place] Seizure precautions On no medication,  ? Alcohol withdrawal  Not on vimpat and has not been on this. Verified with family   GERD (gastroesophageal reflux disease) Continue protonix>IV  Memory loss daughter states that since her OD this summer she has had worsening memory decline She plans on outpatient neurology work up     Advance Care Planning:   Code Status: Full Code   Consults: general surgery   DVT  Prophylaxis: SCDs  Family Communication: updated daughter, CDionisio Paschal by phone: [phone] Severity of Illness: The appropriate patient status for this patient is INPATIENT. Inpatient status is judged to be reasonable and necessary in order to provide the required intensity of service to ensure the patient's safety. The patient's presenting symptoms, physical exam findings, and initial radiographic and laboratory data in the context of their chronic comorbidities is felt to place them at high risk for further clinical deterioration. Furthermore, it is not anticipated that the patient will be medically stable for discharge from the hospital within 2 midnights of admission.   * I certify that at the point of admission it is my clinical judgment that the patient will require inpatient hospital care spanning beyond 2 midnights from the point of admission due to high intensity of service, high risk for further deterioration and high frequency of surveillance required.*  Author: AOrma Flaming MD 04/17/2022 8:20 PM  For on call review [email or web address]

## 2022-04-17 NOTE — ED Notes (Signed)
Pt starting vomit and having increased pain in abdomen. PRN zofran and morphine given.  Pt also started to complaining about feeling like her NG tube was bunched up in her throat and having more pressure against her head after vomiting. Suction turned off   Provider paged and repeat Abdominal x-ray ordered

## 2022-04-17 NOTE — ED Notes (Signed)
X-ray at bedside

## 2022-04-17 NOTE — Assessment & Plan Note (Signed)
repleted in ED x 36mq Mag wnl Trend

## 2022-04-17 NOTE — Assessment & Plan Note (Signed)
Continue protonix>IV

## 2022-04-17 NOTE — Assessment & Plan Note (Addendum)
Seizure precautions On no medication,  ? Alcohol withdrawal  Not on vimpat and has not been on this. Verified with family

## 2022-04-17 NOTE — Assessment & Plan Note (Signed)
A1C of 6.5 this month Hold metformin Sensitive SSI and accuchecks qac/hs while NPO

## 2022-04-17 NOTE — Assessment & Plan Note (Signed)
+  cocaine and alcohol abuse -UDS pending -CIWA protocol with IV thiamine and IV folic acid while NPO

## 2022-04-17 NOTE — Assessment & Plan Note (Signed)
History of suicide attempt in 12/2021 Has not been taking medication per family No SI/HI.

## 2022-04-17 NOTE — H&P (View-Only) (Signed)
[facility] Progress Note     Subjective: 56 year old female known to our service who was admitted to the [facility] hospitalist service from 10/13-10/20 for SBO.  She had been treated conservatively with NGT when she left 10/20 AMA - her NGT had been removed but obstruction was not completely resolved and replacement was recommended. She did not want replacement and was wanting to eat and therefore left AMA. Since then she has had ongoing distension and abdominal pain with intermittent nausea but no emesis.  She presented to the emergency department on 10/24 and had repeat imaging at that time but left prior to formal evaluation due to long wait time. She has been able to eat only a very small amount (a few bites of chicken and a sandwich).  She has been drinking soda, water and beer daily.  She last ate a few bites of chicken last night.  She has increasing abdominal pain with p.o. intake but remains hungry.  She did have loose bowel movements overnight and this morning.  She denies fever, chills, shortness of breath, chest pain, urinary symptoms.  Her daughter [name] is bedside.   Objective: Vital signs in last 24 hours: Temp:  [98.4 F (36.9 C)-98.5 F (36.9 C)] 98.5 F (36.9 C) (10/26 1320) Pulse Rate:  [86-100] 86 (10/26 1345) Resp:  [14-23] 22 (10/26 1345) BP: (135-187)/(80-113) 156/93 (10/26 1345) SpO2:  [95 %-98 %] 96 % (10/26 1345) Weight:  [49.4 kg] 49.4 kg (10/26 0937)    Intake/Output from previous day: No intake/output data recorded. Intake/Output this shift: No intake/output data recorded.  PE: Gen:  Alert, NAD Resp: Respiratory effort unlabored on room air.  CTAB Abd: Moderately distended and diffusely tender to palpation with voluntary guarding.  Hypoactive bowel sounds. MSK: MAE, calf soft and nontender to palpation Psych: Alert and oriented x3  Lab Results:  Recent Labs    04/15/22 1511 04/17/22 1018  WBC 8.3 7.3  HGB 14.3 12.9  HCT 42.1 38.1  PLT  369 352    BMET Recent Labs    04/15/22 1511 04/17/22 1018  NA 134* 135  K 3.9 3.0*  CL 101 102  CO2 24 25  GLUCOSE 92 94  BUN <5* <5*  CREATININE 0.52 0.49  CALCIUM 8.3* 7.9*    PT/INR No results for input(s): "LABPROT", "INR" in the last 72 hours. CMP     Component Value Date/Time   NA 135 04/17/2022 1018   K 3.0 (L) 04/17/2022 1018   CL 102 04/17/2022 1018   CO2 25 04/17/2022 1018   GLUCOSE 94 04/17/2022 1018   BUN <5 (L) 04/17/2022 1018   CREATININE 0.49 04/17/2022 1018   CREATININE 0.57 03/28/2021 0000   CALCIUM 7.9 (L) 04/17/2022 1018   PROT 5.8 (L) 04/17/2022 1018   ALBUMIN 2.7 (L) 04/17/2022 1018   AST 16 04/17/2022 1018   ALT 13 04/17/2022 1018   ALKPHOS 88 04/17/2022 1018   BILITOT 0.4 04/17/2022 1018   GFRNONAA >60 04/17/2022 1018   GFRAA >60 08/03/2018 1514   Lipase     Component Value Date/Time   LIPASE 40 04/17/2022 1018       Studies/Results: CT ABDOMEN PELVIS W CONTRAST  Result Date: 04/17/2022 CLINICAL DATA:  Small bowel obstruction. EXAM: CT ABDOMEN AND PELVIS WITH CONTRAST TECHNIQUE: Multidetector CT imaging of the abdomen and pelvis was performed using the standard protocol following bolus administration of intravenous contrast. RADIATION DOSE REDUCTION: This exam was performed according to the departmental dose-optimization  program which includes automated exposure control, adjustment of the mA and/or kV according to patient size and/or use of iterative reconstruction technique. COMPARISON:  04/15/2022 FINDINGS: Lower chest: Unremarkable. Hepatobiliary: Insert normal infused liver There is no evidence for gallstones, gallbladder wall thickening, or pericholecystic fluid. No intrahepatic or extrahepatic biliary dilation. Pancreas: No focal mass lesion. No dilatation of the main duct. No intraparenchymal cyst. No peripancreatic edema. Spleen: No splenomegaly. No focal mass lesion. Adrenals/Urinary Tract: No adrenal nodule or mass. Kidneys  unremarkable. No evidence for hydroureter. The urinary bladder appears normal for the degree of distention. Stomach/Bowel: Status post gastric bypass. Similar appearance of diffuse small bowel distension. Terminal ileum is decompressed. There is mesenteric edema in the lower abdomen and pelvis with scattered areas of interloop mesenteric fluid. Small bowel loop in the left pelvis shows fecalized enteric contents and is distended up to 3.9 cm diameter. Multiple nondilated loops in the right abdomen show apparent mural edema/thickening. Old no evidence for pneumatosis. Colon is diffusely distended with gas and stool. Vascular/Lymphatic: There is mild atherosclerotic calcification of the abdominal aorta without aneurysm. There is no gastrohepatic or hepatoduodenal ligament lymphadenopathy. No retroperitoneal or mesenteric lymphadenopathy. No pelvic sidewall lymphadenopathy. Reproductive: No adnexal mass. Other: Small volume free fluid seen in the pelvis. Musculoskeletal: Small bilateral groin hernias contain only fat. No worrisome lytic or sclerotic osseous abnormality. T9 compression deformity is stable. IMPRESSION: 1. Similar appearance of diffuse small bowel distension with mesenteric edema in the lower abdomen and pelvis with scattered areas of interloop mesenteric fluid. Small bowel loop in the left pelvis shows fecalized enteric contents and is distended up to 3.9 cm diameter. Multiple nondilated loops in the right abdomen show apparent mural edema/thickening. As before, assessment is limited by lack of intra-abdominal fat. Imaging features could be related to ileus although distal small bowel obstruction cannot be excluded. No evidence for pneumatosis in the small bowel or colon. No intraperitoneal free air. No evidence for intraperitoneal abscess. 2. Small volume free fluid in the pelvis. 3. Status post gastric bypass. 4. Stable T9 compression deformity. 5.  Aortic Atherosclerosis (ICD10-I70.0). Electronically  Signed   By: Misty Stanley M.D.   On: 04/17/2022 13:02   CT ABDOMEN PELVIS W CONTRAST  Result Date: 04/15/2022 CLINICAL DATA:  Bowel obstruction suspected. EXAM: CT ABDOMEN AND PELVIS WITH CONTRAST TECHNIQUE: Multidetector CT imaging of the abdomen and pelvis was performed using the standard protocol following bolus administration of intravenous contrast. RADIATION DOSE REDUCTION: This exam was performed according to the departmental dose-optimization program which includes automated exposure control, adjustment of the mA and/or kV according to patient size and/or use of iterative reconstruction technique. CONTRAST:  45m OMNIPAQUE IOHEXOL 350 MG/ML SOLN COMPARISON:  Multiple priors including CT abdomen pelvis April 11, 2022. FINDINGS: Lower chest: No acute abnormality. Hepatobiliary: No suspicious hepatic lesion. Gallbladder is nondistended. No biliary ductal dilation. Pancreas: No pancreatic ductal dilation or evidence of acute inflammation. Spleen: No splenomegaly. Adrenals/Urinary Tract: Bilateral adrenal glands appear normal. No hydronephrosis. Kidneys demonstrate symmetric enhancement. Urinary bladder is unremarkable for degree of distension. Stomach/Bowel: Small hiatal hernia. Surgical changes of gastric bypass. Dilated loops of large and small bowel throughout the abdomen with decompressed descending colon and rectum. No abrupt transition point to decompressed bowel identified. Fecalized loops of small bowel with large volume of formed stool in the proximal/mid colon. Vascular/Lymphatic: Aortic atherosclerosis. No pathologically enlarged abdominal or pelvic lymph nodes. Reproductive: No acute abnormality. Other: Small volume pelvic free fluid. Surgical change in the anterior abdominal  wall. Soft tissue nodule in the right subcutaneous abdominal wall is similar prior and may reflect sequela of subcutaneous injections. Nonspecific body wall edema is unchanged. Musculoskeletal: No acute osseous  abnormality. IMPRESSION: 1. Dilated loops of large and small bowel throughout the abdomen with decompressed descending colon and rectum. There are multiple interposed loops of dilated/distended and mildly dilated/decompressed of bowel throughout the abdomen without abrupt transition point to decompressed bowel identified, although admittedly evaluation is difficult given the relative paucity of peritoneal fat and extensive distension/dilation of bowel. Findings are favored to reflect ileus. However, early/partial bowel obstruction is not excluded. Consider repeat CT imaging with oral contrast medium to aid in evaluation. 2. Fecalized loops of small bowel as can be seen with slow transit with large volume of stool in the proximal/mid colon suggestive of constipation. 3. Small volume pelvic free fluid, likely reactive. 4.  Aortic Atherosclerosis (ICD10-I70.0). Electronically Signed   By: Dahlia Bailiff M.D.   On: 04/15/2022 18:03    Anti-infectives: Anti-infectives (From admission, onward)    None        Assessment/Plan SBO H/o gastric bypass 2017 Dr. Excell Seltzer, history of laparoscopic hysterectomy - CT 10/13 w/ SBO and TP poster pelvis. Mild twisting of mesentery within RLQ. Small HH -Admitted to hospitalist service 10/13 through 10/20 for SBO managed with conservative treatment.  NG tube removed during admission but obstruction did not completely resolve.  She left AMA 10/20 after recommendation for continued n.p.o. status and NG tube replacement -CT 10/24 and today (10/26) done without oral contrast showing diffuse small bowel distention concerning for ileus versus SBO however fever pSBO given history -Recommend decompression with NG tube.  Have asked nurse to place.  She has had difficult placement in the past and will ask for radiology assistance if nurse unable to place -Remain n.p.o. with likely OR for ex lap tomorrow given she has failed conservative management  She is being admitted to the  hospital service.  We will follow with you   FEN: NPO, NGT LIWS, IVF per primary ID: none VTE: Okay for chemical prophylaxis from surgical perspective   Alcohol use Tobacco use DM GERD COPD Afib  HTN   I reviewed ED provider notes, last 24 h vitals and pain scores, last 48 h intake and output, last 24 h labs and trends, and last 24 h imaging results.     LOS: 0 days    [facility] 04/17/2022, 2:01 PM Please see Amion for pager number during day hours 7:00am-4:30pm

## 2022-04-17 NOTE — ED Provider Notes (Signed)
[facility] Provider Note   CSN: [id number] Arrival date & time: 04/17/22  0930     History  Chief Complaint  Patient presents with   Abdominal Pain    [name] is a 56 y.o. female. With pmh gastric bypass, hysterectomy, recent admit and AMA for SBO 10/13-10/20/23 who re-presents with daughter today for continued worsening abdominal pain, nausea, and loose Bms.  I spoke with patient and patient's daughter, patient left AGAINST MEDICAL ADVICE on the 20th from the hospital after being admitted for a small bowel obstruction.  However, daughter says that patient has difficulty processing information and has not been able to process things well since history of suicide attempt months prior and did not understand that the problem was not fixed and she would still continue to feel bad.  Apparently, there were plans for likely surgery since she was not improving at the time.  She additionally wanted to leave to go see her husband who is visiting from out of town but has since left today.  Since discharge she has had continued abdominal pain but has significantly worsened today.  Is diffuse but worse in the lower left quadrant.  She has small-volume loose bowel movements but no formed or large bowel movements.  She has had nausea but no vomiting.  There have been no fevers.   Abdominal Pain      Home Medications Prior to Admission medications   Medication Sig Start Date End Date Taking? Authorizing Provider  albuterol (PROAIR HFA) 108 (90 Base) MCG/ACT inhaler Inhale 2 puffs into the lungs every 6 (six) hours as needed for wheezing or shortness of breath. 11/16/17   Lindell Spar I, NP  amLODipine (NORVASC) 5 MG tablet Take 1 tablet (5 mg total) by mouth daily. 02/12/22 04/06/23  Elodia Florence., MD  ARIPiprazole (ABILIFY) 5 MG tablet Take 1 tablet (5 mg total) by mouth 2 (two) times daily. 02/12/22 04/06/23  Elodia Florence., MD  ascorbic  acid (VITAMIN C) 500 MG tablet Take 1 tablet (500 mg total) by mouth daily. 02/12/22   Elodia Florence., MD  Cholecalciferol 25 MCG (1000 UT) capsule Take 1 capsule (1,000 Units total) by mouth daily. 08/03/20   Florencia Reasons, MD  folic acid (FOLVITE) 1 MG tablet Take 1 tablet (1 mg total) by mouth daily. Patient not taking: Reported on 04/05/2022 02/12/22   Elodia Florence., MD  gabapentin (NEURONTIN) 300 MG capsule Take 1 capsule (300 mg total) by mouth daily AND 2 capsules (600 mg total) at bedtime. Patient taking differently: Take 1 capsule (300 mg total) by mouth daily.  02/12/22 04/06/23  Elodia Florence., MD  ibuprofen (ADVIL) 800 MG tablet Take 800 mg by mouth 3 (three) times daily as needed for pain. 12/31/21   [provider]  insulin aspart (NOVOLOG) 100 UNIT/ML injection Inject 0-15 Units into the skin 3 (three) times daily with meals. CBG < 70: treat hypoglycemia   CBG 70 - 120: 0 units  CBG 121 - 150: 2 units  CBG 151 - 200: 3 units  CBG 201 - 250: 5 units  CBG 251 - 300: 8 units  CBG 301 - 350: 11 units  CBG 351 - 400: 15 units  CBG > 400: call MD Patient not taking: Reported on 04/05/2022 02/12/22   Elodia Florence., MD  ipratropium-albuterol (DUONEB) 0.5-2.5 (3) MG/3ML SOLN Take 3 mLs by nebulization every 6 (six) hours  as needed. Patient not taking: Reported on 04/05/2022 02/12/22   Elodia Florence., MD  lactulose (CHRONULAC) 10 GM/15ML solution Take 45 mLs (30 g total) by mouth 2 (two) times daily. 02/12/22   Elodia Florence., MD  metFORMIN (GLUCOPHAGE) 500 MG tablet Take 1 tablet (500 mg total) by mouth 2 (two) times daily with a meal. Patient not taking: Reported on 04/05/2022 11/16/21 12/16/21  Thurnell Lose, MD  pantoprazole (PROTONIX) 40 MG tablet Take 1 tablet (40 mg total) by mouth daily. 11/16/21   Thurnell Lose, MD  PARoxetine (PAXIL) 20 MG tablet Take 20 mg by mouth every morning. 03/14/22   [provider]  QUEtiapine  (SEROQUEL) 400 MG tablet Take 400 mg by mouth at bedtime. 03/14/22   [provider]  SYMBICORT 160-4.5 MCG/ACT inhaler Inhale 2 puffs into the lungs daily as needed (sob and wheezing). Patient not taking: Reported on 11/13/2021 11/16/17   Lindell Spar I, NP      Allergies    Iron dextran and Aspirin    Review of Systems   Review of Systems  Gastrointestinal:  Positive for abdominal pain.    Physical Exam Updated Vital Signs BP (!) 134/93   Pulse 91   Temp 98.5 F (36.9 C) (Oral)   Resp 19   Ht '5\' 2"'$  (1.575 m)   Wt 49.4 kg   LMP 01/08/2011   SpO2 95%   BMI 19.94 kg/m  Physical Exam Constitutional: Alert and oriented.  Frail and uncomfortable appearing but nontoxic  eyes: Conjunctivae are normal. ENT      Head: Normocephalic and atraumatic.      Nose: No congestion.      Mouth/Throat: Mucous membranes are moist.      Neck: No stridor. Cardiovascular: S1, S2, tachycardic, regular rhythm Respiratory: Normal respiratory effort. Breath sounds are normal. Gastrointestinal: Tympanitic and distended with diffuse tenderness but significantly worse in the lower abdomen on the left lower quadrant with rebound guarding Musculoskeletal: Normal range of motion in all extremities. No pitting edema of the lower extremities Neurologic: Normal speech and language. No gross focal neurologic deficits are appreciated. Skin: Skin is warm, dry and intact. No rash noted. Psychiatric: Mood and affect are normal. Speech and behavior are normal.  ED Results / Procedures / Treatments   Labs (all labs ordered are listed, but only abnormal results are displayed) Labs Reviewed  COMPREHENSIVE METABOLIC PANEL - Abnormal; Notable for the following components:      Result Value   Potassium 3.0 (*)    BUN <5 (*)    Calcium 7.9 (*)    Total Protein 5.8 (*)    Albumin 2.7 (*)    All other components within normal limits  CBC WITH DIFFERENTIAL/PLATELET  LIPASE, BLOOD  LACTIC ACID, PLASMA   MAGNESIUM  LACTIC ACID, PLASMA  RAPID URINE DRUG SCREEN, HOSP PERFORMED    EKG None  Radiology CT ABDOMEN PELVIS W CONTRAST  Result Date: 04/17/2022 CLINICAL DATA:  Small bowel obstruction. EXAM: CT ABDOMEN AND PELVIS WITH CONTRAST TECHNIQUE: Multidetector CT imaging of the abdomen and pelvis was performed using the standard protocol following bolus administration of intravenous contrast. RADIATION DOSE REDUCTION: This exam was performed according to the departmental dose-optimization program which includes automated exposure control, adjustment of the mA and/or kV according to patient size and/or use of iterative reconstruction technique. COMPARISON:  04/15/2022 FINDINGS: Lower chest: Unremarkable. Hepatobiliary: Insert normal infused liver There is no evidence for gallstones, gallbladder wall thickening, or  pericholecystic fluid. No intrahepatic or extrahepatic biliary dilation. Pancreas: No focal mass lesion. No dilatation of the main duct. No intraparenchymal cyst. No peripancreatic edema. Spleen: No splenomegaly. No focal mass lesion. Adrenals/Urinary Tract: No adrenal nodule or mass. Kidneys unremarkable. No evidence for hydroureter. The urinary bladder appears normal for the degree of distention. Stomach/Bowel: Status post gastric bypass. Similar appearance of diffuse small bowel distension. Terminal ileum is decompressed. There is mesenteric edema in the lower abdomen and pelvis with scattered areas of interloop mesenteric fluid. Small bowel loop in the left pelvis shows fecalized enteric contents and is distended up to 3.9 cm diameter. Multiple nondilated loops in the right abdomen show apparent mural edema/thickening. Old no evidence for pneumatosis. Colon is diffusely distended with gas and stool. Vascular/Lymphatic: There is mild atherosclerotic calcification of the abdominal aorta without aneurysm. There is no gastrohepatic or hepatoduodenal ligament lymphadenopathy. No retroperitoneal or  mesenteric lymphadenopathy. No pelvic sidewall lymphadenopathy. Reproductive: No adnexal mass. Other: Small volume free fluid seen in the pelvis. Musculoskeletal: Small bilateral groin hernias contain only fat. No worrisome lytic or sclerotic osseous abnormality. T9 compression deformity is stable. IMPRESSION: 1. Similar appearance of diffuse small bowel distension with mesenteric edema in the lower abdomen and pelvis with scattered areas of interloop mesenteric fluid. Small bowel loop in the left pelvis shows fecalized enteric contents and is distended up to 3.9 cm diameter. Multiple nondilated loops in the right abdomen show apparent mural edema/thickening. As before, assessment is limited by lack of intra-abdominal fat. Imaging features could be related to ileus although distal small bowel obstruction cannot be excluded. No evidence for pneumatosis in the small bowel or colon. No intraperitoneal free air. No evidence for intraperitoneal abscess. 2. Small volume free fluid in the pelvis. 3. Status post gastric bypass. 4. Stable T9 compression deformity. 5.  Aortic Atherosclerosis (ICD10-I70.0). Electronically Signed   By: Misty Stanley M.D.   On: 04/17/2022 13:02   CT ABDOMEN PELVIS W CONTRAST  Result Date: 04/15/2022 CLINICAL DATA:  Bowel obstruction suspected. EXAM: CT ABDOMEN AND PELVIS WITH CONTRAST TECHNIQUE: Multidetector CT imaging of the abdomen and pelvis was performed using the standard protocol following bolus administration of intravenous contrast. RADIATION DOSE REDUCTION: This exam was performed according to the departmental dose-optimization program which includes automated exposure control, adjustment of the mA and/or kV according to patient size and/or use of iterative reconstruction technique. CONTRAST:  48m OMNIPAQUE IOHEXOL 350 MG/ML SOLN COMPARISON:  Multiple priors including CT abdomen pelvis April 11, 2022. FINDINGS: Lower chest: No acute abnormality. Hepatobiliary: No suspicious  hepatic lesion. Gallbladder is nondistended. No biliary ductal dilation. Pancreas: No pancreatic ductal dilation or evidence of acute inflammation. Spleen: No splenomegaly. Adrenals/Urinary Tract: Bilateral adrenal glands appear normal. No hydronephrosis. Kidneys demonstrate symmetric enhancement. Urinary bladder is unremarkable for degree of distension. Stomach/Bowel: Small hiatal hernia. Surgical changes of gastric bypass. Dilated loops of large and small bowel throughout the abdomen with decompressed descending colon and rectum. No abrupt transition point to decompressed bowel identified. Fecalized loops of small bowel with large volume of formed stool in the proximal/mid colon. Vascular/Lymphatic: Aortic atherosclerosis. No pathologically enlarged abdominal or pelvic lymph nodes. Reproductive: No acute abnormality. Other: Small volume pelvic free fluid. Surgical change in the anterior abdominal wall. Soft tissue nodule in the right subcutaneous abdominal wall is similar prior and may reflect sequela of subcutaneous injections. Nonspecific body wall edema is unchanged. Musculoskeletal: No acute osseous abnormality. IMPRESSION: 1. Dilated loops of large and small bowel throughout the abdomen with  decompressed descending colon and rectum. There are multiple interposed loops of dilated/distended and mildly dilated/decompressed of bowel throughout the abdomen without abrupt transition point to decompressed bowel identified, although admittedly evaluation is difficult given the relative paucity of peritoneal fat and extensive distension/dilation of bowel. Findings are favored to reflect ileus. However, early/partial bowel obstruction is not excluded. Consider repeat CT imaging with oral contrast medium to aid in evaluation. 2. Fecalized loops of small bowel as can be seen with slow transit with large volume of stool in the proximal/mid colon suggestive of constipation. 3. Small volume pelvic free fluid, likely  reactive. 4.  Aortic Atherosclerosis (ICD10-I70.0). Electronically Signed   By: Dahlia Bailiff M.D.   On: 04/15/2022 18:03    Procedures Procedures  Remain on constant cardiac monitoring, sinus tachycardia initially improved to normal sinus rhythm. Medications Ordered in ED Medications  potassium chloride 10 mEq in 100 mL IVPB (10 mEq Intravenous New Bag/Given 04/17/22 1414)  lactated ringers bolus 1,000 mL (0 mLs Intravenous Stopped 04/17/22 1216)  morphine (PF) 4 MG/ML injection 4 mg (4 mg Intravenous Given 04/17/22 1015)  ondansetron (ZOFRAN) injection 4 mg (4 mg Intravenous Given 04/17/22 1014)  magnesium sulfate IVPB 1 g 100 mL (0 g Intravenous Stopped 04/17/22 1341)  HYDROmorphone (DILAUDID) injection 0.5 mg (0.5 mg Intravenous Given 04/17/22 1248)  iohexol (OMNIPAQUE) 350 MG/ML injection 80 mL (80 mLs Intravenous Contrast Given 04/17/22 1311)    ED Course/ Medical Decision Making/ A&P Clinical Course as of 04/17/22 1416  Thu Apr 17, 2022  1112 Labs reviewed with normal white blood cell count 7.3.  No anemia hemoglobin 12.9.  She does have hypokalemia 3.0 with normal magnesium 1.7.  We will order for IV repletion of both since she remains NPO.  Mild hypocalcemia 7.9 on CMP.  Lactate 1.9 and reassuring unlikely to have associated ischemic bowel. [VB]  1337 Spoke with on-call general surgery PA Jana Half who will be down to evaluate the patient and provide formal recommendations.  She did note that she was likely requiring surgery but will give her 4 more recommendations after eval and the patient.  She recommends admission to medicine. [VB]  9794 Discussed case with medicine team who will be down to evaluate the patient put in orders for her admission. [VB]    Clinical Course User Index [VB] Elgie Congo, MD                           Medical Decision Making MEHAR SAGEN is a 56 y.o. female. With pmh gastric bypass, hysterectomy, recent admit and AMA for SBO 10/13-10/20/23 who  re-presents with daughter today for continued worsening abdominal pain, nausea, and loose Bms.    Patient last came in the ED but was not seen after having a repeat CTAP 04/15/22 when representing which showed likely SBO without transition point versus ileus.  Her pain is significantly worsened today.  She is mildly tachycardic but otherwise hemodynamically stable.  Unlikely to be septic or have mesenteric ischemia.  However, sending repeat labs to evaluate for acute electrolyte abnormalities, AKI and lactate for evidence of mesenteric ischemia and repeat CTAP today to determine if there has been progression to SBO today.  We will keep patient n.p.o. and give IV fluids, IV morphine and Zofran.  She will likely require readmission for continued management of symptoms.  I reviewed patient's labs which had mild hypokalemia 3.0 magnesium 1.7 which I ordered IV repletion 4.  She  had a normal white blood cell count 7.3 and normal lactate 1.9.  Unlikely to have associated ischemic bowel or sepsis.  CTAP with IV contrast obtained which I personally reviewed and showed evidence of diffuse small bowel distention.  Per radiologist read has associated mesenteric edema and multiple nondilated loops suggestive of either distal small bowel obstruction or ileus.  No obvious transition point.  Clinically, patient's presentation most consistent with small bowel obstruction.  Consulted general surgery and admitted to medicine.  Amount and/or Complexity of Data Reviewed Labs: ordered. Radiology: ordered.  Risk Prescription drug management. Decision regarding hospitalization.   Final Clinical Impression(s) / ED Diagnoses Final diagnoses:  Abdominal pain, unspecified abdominal location  Hypokalemia  Small bowel obstruction [facility])    Rx / DC Orders ED Discharge Orders     None         Elgie Congo, MD 04/17/22 1416

## 2022-04-17 NOTE — Assessment & Plan Note (Addendum)
No s/s of exacerbation Continue SABA prn and symbicort

## 2022-04-17 NOTE — ED Triage Notes (Signed)
Pt BIB [facility] from home due to abdominal pain.  Two weeks ago diagnosed with bowel obstruction but pt left twice from hospital without treatment.  Pt reports pain & diarrhea.  Abdomen distended and rigid.  VS BP 148/68, 105 pulse, CBG 127.

## 2022-04-18 ENCOUNTER — Encounter (HOSPITAL_COMMUNITY)
Admission: EM | Disposition: A | Discharge status: Home / Self Care | Payer: Self-pay | Source: Home / Self Care | Attending: Internal Medicine

## 2022-04-18 ENCOUNTER — Encounter (HOSPITAL_COMMUNITY): Payer: Self-pay | Admitting: Family Medicine

## 2022-04-18 ENCOUNTER — Other Ambulatory Visit: Payer: Self-pay

## 2022-04-18 ENCOUNTER — Inpatient Hospital Stay (HOSPITAL_COMMUNITY): Payer: Medicare Other | Admitting: Anesthesiology

## 2022-04-18 DIAGNOSIS — K56609 Unspecified intestinal obstruction, unspecified as to partial versus complete obstruction: Secondary | ICD-10-CM | POA: Diagnosis not present

## 2022-04-18 DIAGNOSIS — R109 Unspecified abdominal pain: Secondary | ICD-10-CM | POA: Diagnosis not present

## 2022-04-18 DIAGNOSIS — F17210 Nicotine dependence, cigarettes, uncomplicated: Secondary | ICD-10-CM

## 2022-04-18 DIAGNOSIS — J449 Chronic obstructive pulmonary disease, unspecified: Secondary | ICD-10-CM | POA: Diagnosis not present

## 2022-04-18 DIAGNOSIS — I10 Essential (primary) hypertension: Secondary | ICD-10-CM

## 2022-04-18 HISTORY — PX: BOWEL RESECTION: SHX1257

## 2022-04-18 HISTORY — PX: LAPAROTOMY: SHX154

## 2022-04-18 LAB — BASIC METABOLIC PANEL
Anion gap: 7 (ref 5–15)
BUN: <5 mg/dL — ABNORMAL LOW (ref 6–20)
CO2: 27 mmol/L (ref 22–32)
Calcium: 8.1 mg/dL — ABNORMAL LOW (ref 8.9–10.3)
Chloride: 102 mmol/L (ref 98–111)
Creatinine, Ser: 0.37 mg/dL — ABNORMAL LOW (ref 0.44–1.00)
GFR, Estimated: >60 mL/min (ref >60)
Glucose, Bld: 86 mg/dL (ref 70–99)
Potassium: 3.4 mmol/L — ABNORMAL LOW (ref 3.5–5.1)
Sodium: 136 mmol/L (ref 135–145)

## 2022-04-18 LAB — CBC
HCT: 39.7 % (ref 36.0–46.0)
Hemoglobin: 13 g/dL (ref 12.0–15.0)
MCH: 29 pg (ref 26.0–34.0)
MCHC: 32.7 g/dL (ref 30.0–36.0)
MCV: 88.6 fL (ref 80.0–100.0)
Platelets: 348 10*3/uL (ref 150–400)
RBC: 4.48 MIL/uL (ref 3.87–5.11)
RDW: 13.3 % (ref 11.5–15.5)
WBC: 9.7 10*3/uL (ref 4.0–10.5)
nRBC: 0 % (ref 0.0–0.2)

## 2022-04-18 LAB — GLUCOSE, CAPILLARY: Glucose-Capillary: 151 mg/dL — ABNORMAL HIGH (ref 70–99)

## 2022-04-18 LAB — LACTIC ACID, PLASMA: Lactic Acid, Venous: 1.2 mmol/L (ref 0.5–1.9)

## 2022-04-18 SURGERY — LAPAROTOMY, EXPLORATORY
Anesthesia: General | Site: Abdomen

## 2022-04-18 MED ORDER — HYDROMORPHONE HCL 1 MG/ML IJ SOLN
1.0000 mg | Freq: Once | INTRAMUSCULAR | Status: AC
  Administered 2022-04-18: 1 mg via INTRAVENOUS
  Filled 2022-04-18: qty 1

## 2022-04-18 MED ORDER — PROPOFOL 10 MG/ML IV BOLUS
INTRAVENOUS | Status: DC | PRN
  Administered 2022-04-18: 100 mg via INTRAVENOUS

## 2022-04-18 MED ORDER — DEXAMETHASONE SODIUM PHOSPHATE 10 MG/ML IJ SOLN
INTRAMUSCULAR | Status: DC | PRN
  Administered 2022-04-18: 10 mg via INTRAVENOUS

## 2022-04-18 MED ORDER — DROPERIDOL 2.5 MG/ML IJ SOLN
0.6250 mg | Freq: Once | INTRAMUSCULAR | Status: AC | PRN
  Administered 2022-04-18: 0.625 mg via INTRAVENOUS

## 2022-04-18 MED ORDER — PROPOFOL 10 MG/ML IV BOLUS
INTRAVENOUS | Status: AC
  Filled 2022-04-18: qty 20

## 2022-04-18 MED ORDER — CHLORHEXIDINE GLUCONATE 0.12 % MT SOLN
15.0000 mL | Freq: Once | OROMUCOSAL | Status: AC
  Administered 2022-04-18: 15 mL via OROMUCOSAL

## 2022-04-18 MED ORDER — SUCCINYLCHOLINE CHLORIDE 200 MG/10ML IV SOSY
PREFILLED_SYRINGE | INTRAVENOUS | Status: AC
  Filled 2022-04-18: qty 10

## 2022-04-18 MED ORDER — CHLORHEXIDINE GLUCONATE CLOTH 2 % EX PADS: 6.0000 | MEDICATED_PAD | Freq: Once | CUTANEOUS | Status: DC

## 2022-04-18 MED ORDER — SODIUM CHLORIDE 0.9 % IV SOLN
INTRAVENOUS | Status: AC
  Filled 2022-04-18: qty 2

## 2022-04-18 MED ORDER — SUCCINYLCHOLINE CHLORIDE 200 MG/10ML IV SOSY
PREFILLED_SYRINGE | INTRAVENOUS | Status: DC | PRN
  Administered 2022-04-18: 40 mg via INTRAVENOUS

## 2022-04-18 MED ORDER — ONDANSETRON HCL 4 MG/2ML IJ SOLN
INTRAMUSCULAR | Status: DC | PRN
  Administered 2022-04-18: 4 mg via INTRAVENOUS

## 2022-04-18 MED ORDER — HYDROMORPHONE 1 MG/ML IV SOLN
INTRAVENOUS | Status: AC
  Filled 2022-04-18: qty 30

## 2022-04-18 MED ORDER — DROPERIDOL 2.5 MG/ML IJ SOLN
INTRAMUSCULAR | Status: AC
  Filled 2022-04-18: qty 2

## 2022-04-18 MED ORDER — LACTATED RINGERS IV SOLN: INTRAVENOUS | Status: DC

## 2022-04-18 MED ORDER — ROCURONIUM BROMIDE 10 MG/ML (PF) SYRINGE
PREFILLED_SYRINGE | INTRAVENOUS | Status: AC
  Filled 2022-04-18: qty 10

## 2022-04-18 MED ORDER — SODIUM CHLORIDE 0.9 % IV SOLN
2.0000 g | INTRAVENOUS | Status: AC
  Administered 2022-04-18: 2 g via INTRAVENOUS

## 2022-04-18 MED ORDER — LIDOCAINE 2% (20 MG/ML) 5 ML SYRINGE
INTRAMUSCULAR | Status: DC | PRN
  Administered 2022-04-18: 60 mg via INTRAVENOUS

## 2022-04-18 MED ORDER — SUGAMMADEX SODIUM 200 MG/2ML IV SOLN
INTRAVENOUS | Status: DC | PRN
  Administered 2022-04-18: 200 mg via INTRAVENOUS

## 2022-04-18 MED ORDER — HYDROMORPHONE 1 MG/ML IV SOLN
INTRAVENOUS | Status: DC
  Administered 2022-04-18: 3.2 mg via INTRAVENOUS
  Administered 2022-04-18: 1.8 mg via INTRAVENOUS
  Administered 2022-04-19: 0.9 mg via INTRAVENOUS
  Administered 2022-04-19: 0.6 mg via INTRAVENOUS
  Administered 2022-04-19: 3.9 mg via INTRAVENOUS
  Administered 2022-04-19: 3.6 mg via INTRAVENOUS
  Administered 2022-04-19: 0.3 mg via INTRAVENOUS
  Administered 2022-04-20: 0.9 mg via INTRAVENOUS
  Administered 2022-04-20: 0.6 mg via INTRAVENOUS
  Administered 2022-04-20: 0.3 mg via INTRAVENOUS
  Administered 2022-04-20: 1.2 mg via INTRAVENOUS
  Administered 2022-04-20: 0.9 mg via INTRAVENOUS
  Administered 2022-04-21: 1 mg via INTRAVENOUS
  Filled 2022-04-18 (×2): qty 30

## 2022-04-18 MED ORDER — ONDANSETRON HCL 4 MG/2ML IJ SOLN: 4.0000 mg | Freq: Four times a day (QID) | INTRAMUSCULAR | Status: DC | PRN

## 2022-04-18 MED ORDER — PROCHLORPERAZINE EDISYLATE 10 MG/2ML IJ SOLN
10.0000 mg | Freq: Four times a day (QID) | INTRAMUSCULAR | Status: DC | PRN
  Administered 2022-04-18: 10 mg via INTRAVENOUS
  Filled 2022-04-18: qty 2

## 2022-04-18 MED ORDER — IPRATROPIUM-ALBUTEROL 0.5-2.5 (3) MG/3ML IN SOLN: 3.0000 mL | Freq: Four times a day (QID) | RESPIRATORY_TRACT | Status: DC | PRN

## 2022-04-18 MED ORDER — NALOXONE HCL 0.4 MG/ML IJ SOLN: 0.4000 mg | INTRAMUSCULAR | Status: DC | PRN

## 2022-04-18 MED ORDER — LIDOCAINE 2% (20 MG/ML) 5 ML SYRINGE
INTRAMUSCULAR | Status: AC
  Filled 2022-04-18: qty 5

## 2022-04-18 MED ORDER — DIPHENHYDRAMINE HCL 12.5 MG/5ML PO ELIX: 12.5000 mg | ORAL_SOLUTION | Freq: Four times a day (QID) | ORAL | Status: DC | PRN

## 2022-04-18 MED ORDER — ACETAMINOPHEN 10 MG/ML IV SOLN
INTRAVENOUS | Status: DC | PRN
  Administered 2022-04-18: 1000 mg via INTRAVENOUS

## 2022-04-18 MED ORDER — FENTANYL CITRATE (PF) 250 MCG/5ML IJ SOLN
INTRAMUSCULAR | Status: DC | PRN
  Administered 2022-04-18 (×3): 50 ug via INTRAVENOUS
  Administered 2022-04-18: 150 ug via INTRAVENOUS
  Administered 2022-04-18 (×2): 50 ug via INTRAVENOUS

## 2022-04-18 MED ORDER — ACETAMINOPHEN 10 MG/ML IV SOLN
INTRAVENOUS | Status: AC
  Filled 2022-04-18: qty 100

## 2022-04-18 MED ORDER — MIDAZOLAM HCL 2 MG/2ML IJ SOLN
INTRAMUSCULAR | Status: AC
  Filled 2022-04-18: qty 2

## 2022-04-18 MED ORDER — HYDROMORPHONE HCL 1 MG/ML IJ SOLN
INTRAMUSCULAR | Status: AC
  Filled 2022-04-18: qty 2

## 2022-04-18 MED ORDER — FENTANYL CITRATE (PF) 250 MCG/5ML IJ SOLN
INTRAMUSCULAR | Status: AC
  Filled 2022-04-18: qty 5

## 2022-04-18 MED ORDER — DIPHENHYDRAMINE HCL 50 MG/ML IJ SOLN: 12.5000 mg | Freq: Four times a day (QID) | INTRAMUSCULAR | Status: DC | PRN

## 2022-04-18 MED ORDER — ROCURONIUM BROMIDE 10 MG/ML (PF) SYRINGE
PREFILLED_SYRINGE | INTRAVENOUS | Status: DC | PRN
  Administered 2022-04-18: 30 mg via INTRAVENOUS

## 2022-04-18 MED ORDER — DEXMEDETOMIDINE HCL IN NACL 80 MCG/20ML IV SOLN
INTRAVENOUS | Status: DC | PRN
  Administered 2022-04-18 (×2): 20 ug via BUCCAL

## 2022-04-18 MED ORDER — ORAL CARE MOUTH RINSE: 15.0000 mL | Freq: Once | OROMUCOSAL | Status: AC

## 2022-04-18 MED ORDER — MORPHINE SULFATE (PF) 2 MG/ML IV SOLN
2.0000 mg | INTRAVENOUS | Status: DC | PRN
  Administered 2022-04-18 (×2): 2 mg via INTRAVENOUS
  Filled 2022-04-18 (×2): qty 1

## 2022-04-18 MED ORDER — HYDROMORPHONE HCL 1 MG/ML IJ SOLN
0.2500 mg | INTRAMUSCULAR | Status: DC | PRN
  Administered 2022-04-18 (×4): 0.5 mg via INTRAVENOUS

## 2022-04-18 MED ORDER — 0.9 % SODIUM CHLORIDE (POUR BTL) OPTIME
TOPICAL | Status: DC | PRN
  Administered 2022-04-18: 1000 mL

## 2022-04-18 MED ORDER — DEXAMETHASONE SODIUM PHOSPHATE 10 MG/ML IJ SOLN
INTRAMUSCULAR | Status: AC
  Filled 2022-04-18: qty 1

## 2022-04-18 MED ORDER — MIDAZOLAM HCL 5 MG/5ML IJ SOLN
INTRAMUSCULAR | Status: DC | PRN
  Administered 2022-04-18: 2 mg via INTRAVENOUS

## 2022-04-18 MED ORDER — POTASSIUM CHLORIDE 10 MEQ/100ML IV SOLN
10.0000 meq | Freq: Once | INTRAVENOUS | Status: AC
  Administered 2022-04-18: 10 meq via INTRAVENOUS
  Filled 2022-04-18: qty 100

## 2022-04-18 MED ORDER — ONDANSETRON HCL 4 MG/2ML IJ SOLN
INTRAMUSCULAR | Status: AC
  Filled 2022-04-18: qty 2

## 2022-04-18 MED ORDER — PHENYLEPHRINE HCL-NACL 20-0.9 MG/250ML-% IV SOLN
INTRAVENOUS | Status: DC | PRN
  Administered 2022-04-18: 25 ug/min via INTRAVENOUS

## 2022-04-18 MED ORDER — SODIUM CHLORIDE 0.9% FLUSH: 9.0000 mL | INTRAVENOUS | Status: DC | PRN

## 2022-04-18 SURGICAL SUPPLY — 41 items
APL PRP STRL LF DISP 70% ISPRP (MISCELLANEOUS) ×2
BAG COUNTER SPONGE SURGICOUNT (BAG) ×2 IMPLANT
BAG SPNG CNTER NS LX DISP (BAG) ×2
CANISTER SUCT 3000ML PPV (MISCELLANEOUS) ×2 IMPLANT
CHLORAPREP W/TINT 26 (MISCELLANEOUS) ×2 IMPLANT
COVER SURGICAL LIGHT HANDLE (MISCELLANEOUS) ×2 IMPLANT
DRAPE LAPAROSCOPIC ABDOMINAL (DRAPES) ×2 IMPLANT
DRAPE WARM FLUID 44X44 (DRAPES) ×2 IMPLANT
DRSG OPSITE POSTOP 4X8 (GAUZE/BANDAGES/DRESSINGS) IMPLANT
ELECT CAUTERY BLADE 6.4 (BLADE) ×2 IMPLANT
ELECT REM PT RETURN 9FT ADLT (ELECTROSURGICAL) ×2
ELECTRODE REM PT RTRN 9FT ADLT (ELECTROSURGICAL) ×2 IMPLANT
GLOVE BIO SURGEON STRL SZ8 (GLOVE) ×2 IMPLANT
GLOVE BIOGEL PI IND STRL 8 (GLOVE) ×2 IMPLANT
GOWN STRL REUS W/ TWL LRG LVL3 (GOWN DISPOSABLE) ×2 IMPLANT
GOWN STRL REUS W/ TWL XL LVL3 (GOWN DISPOSABLE) ×2 IMPLANT
GOWN STRL REUS W/TWL LRG LVL3 (GOWN DISPOSABLE) ×2
GOWN STRL REUS W/TWL XL LVL3 (GOWN DISPOSABLE) ×2
HANDLE SUCTION POOLE (INSTRUMENTS) ×2 IMPLANT
KIT BASIN OR (CUSTOM PROCEDURE TRAY) ×2 IMPLANT
KIT TURNOVER KIT B (KITS) ×2 IMPLANT
LIGASURE IMPACT 36 18CM CVD LR (INSTRUMENTS) IMPLANT
NS IRRIG 1000ML POUR BTL (IV SOLUTION) ×4 IMPLANT
PACK GENERAL/GYN (CUSTOM PROCEDURE TRAY) ×2 IMPLANT
PAD ARMBOARD 7.5X6 YLW CONV (MISCELLANEOUS) ×2 IMPLANT
PENCIL SMOKE EVACUATOR (MISCELLANEOUS) ×2 IMPLANT
RELOAD PROXIMATE 75MM BLUE (ENDOMECHANICALS) ×4 IMPLANT
RELOAD STAPLE 75 3.8 BLU REG (ENDOMECHANICALS) IMPLANT
SPECIMEN JAR LARGE (MISCELLANEOUS) IMPLANT
STAPLER GUN LINEAR PROX 60 (STAPLE) IMPLANT
STAPLER PROXIMATE 75MM BLUE (STAPLE) IMPLANT
STAPLER VISISTAT 35W (STAPLE) ×2 IMPLANT
SUCTION POOLE HANDLE (INSTRUMENTS) ×2
SUT PDS AB 1 TP1 96 (SUTURE) ×4 IMPLANT
SUT VIC AB 2-0 SH 18 (SUTURE) ×2 IMPLANT
SUT VIC AB 3-0 SH 18 (SUTURE) ×2 IMPLANT
SUT VICRYL AB 2 0 TIES (SUTURE) ×2 IMPLANT
SUT VICRYL AB 3 0 TIES (SUTURE) ×2 IMPLANT
TOWEL GREEN STERILE (TOWEL DISPOSABLE) ×2 IMPLANT
TOWEL GREEN STERILE FF (TOWEL DISPOSABLE) ×2 IMPLANT
TRAY FOLEY MTR SLVR 16FR STAT (SET/KITS/TRAYS/PACK) IMPLANT

## 2022-04-18 NOTE — Interval H&P Note (Signed)
History and Physical Interval Note:  04/18/2022 11:32 AM  [name]  has presented today for surgery, with the diagnosis of small bowel obstruction.  The various methods of treatment have been discussed with the patient and family. After consideration of risks, benefits and other options for treatment, the patient has consented to  Procedure(s): EXPLORATORY LAPAROTOMY (N/A) as a surgical intervention.  The patient's history has been reviewed, patient examined, no change in status, stable for surgery.  I have reviewed the patient's chart and labs.  Questions were answered to the patient's satisfaction.  The procedure has been discussed with the patient.  Alternative therapies have been discussed with the patient.  Operative risks include bleeding,  Infection,  Organ injury,  Nerve injury,  Blood vessel injury,  DVT,  Pulmonary embolism,  Death,  And possible reoperation.  Medical management risks include worsening of present situation.  The success of the procedure is 50 -90 % at treating patients symptoms.  The patient understands and agrees to proceed.   Turner Daniels MD

## 2022-04-18 NOTE — Anesthesia Postprocedure Evaluation (Signed)
Anesthesia Post Note  Patient: [name]  Procedure(s) Performed: EXPLORATORY LAPAROTOMY SMALL BOWEL RESECTION (Abdomen)     Patient location during evaluation: PACU Anesthesia Type: General Level of consciousness: awake Pain management: pain level controlled Vital Signs Assessment: post-procedure vital signs reviewed and stable Respiratory status: spontaneous breathing, nonlabored ventilation, respiratory function stable and patient connected to nasal cannula oxygen Cardiovascular status: blood pressure returned to baseline and stable Postop Assessment: no apparent nausea or vomiting Anesthetic complications: no   No notable events documented.  Last Vitals:  Vitals:   04/18/22 1430 04/18/22 1445  BP: 139/80 (!) 148/84  Pulse: 88 98  Resp: 16 17  Temp:  36.7 C  SpO2: 97% 97%    Last Pain:  Vitals:   04/18/22 1445  TempSrc:   PainSc: Asleep                 Keymiah Lyles P Angelic Schnelle

## 2022-04-18 NOTE — Anesthesia Preprocedure Evaluation (Addendum)
Anesthesia Evaluation  Patient identified by MRN, date of birth, ID band Patient awake    Reviewed: Allergy & Precautions, NPO status , Patient's Chart, lab work & pertinent test results  History of Anesthesia Complications Negative for: history of anesthetic complications  Airway Mallampati: II  TM Distance: >3 FB Neck ROM: Full    Dental no notable dental hx.    Pulmonary COPD, Current Smoker,    Pulmonary exam normal        Cardiovascular hypertension, Pt. on medications Normal cardiovascular exam+ dysrhythmias Atrial Fibrillation      Neuro/Psych Seizures -,  Anxiety Depression Bipolar Disorder Schizophrenia    GI/Hepatic Neg liver ROS, GERD  Medicated,NGT   Endo/Other  diabetes, Type 2Hypothyroidism   Renal/GU negative Renal ROS  negative genitourinary   Musculoskeletal negative musculoskeletal ROS (+)   Abdominal   Peds  Hematology negative hematology ROS (+)   Anesthesia Other Findings Day of surgery medications reviewed with patient.  Reproductive/Obstetrics negative OB ROS                            Anesthesia Physical Anesthesia Plan  ASA: 3  Anesthesia Plan: General   Post-op Pain Management: Ofirmev IV (intra-op)*   Induction: Intravenous, Rapid sequence and Cricoid pressure planned  PONV Risk Score and Plan: 3 and Treatment may vary due to age or medical condition, Midazolam, Ondansetron and Dexamethasone  Airway Management Planned: Oral ETT and Video Laryngoscope Planned  Additional Equipment: None  Intra-op Plan:   Post-operative Plan: Extubation in OR  Informed Consent: I have reviewed the patients History and Physical, chart, labs and discussed the procedure including the risks, benefits and alternatives for the proposed anesthesia with the patient or authorized representative who has indicated his/her understanding and acceptance.     Dental advisory  given  Plan Discussed with: CRNA  Anesthesia Plan Comments:        Anesthesia Quick Evaluation

## 2022-04-18 NOTE — Transfer of Care (Signed)
Immediate Anesthesia Transfer of Care Note  Patient: [name]  Procedure(s) Performed: EXPLORATORY LAPAROTOMY SMALL BOWEL RESECTION (Abdomen)  Patient Location: PACU  Anesthesia Type:General  Level of Consciousness: drowsy  Airway & Oxygen Therapy: Patient Spontanous Breathing  Post-op Assessment: Report given to RN, Post -op Vital signs reviewed and stable, and Patient moving all extremities  Post vital signs: Reviewed and stable  Last Vitals:  Vitals Value Taken Time  BP 136/80 04/18/22 1354  Temp 36.4 C 04/18/22 1354  Pulse 81 04/18/22 1358  Resp 12 04/18/22 1358  SpO2 92 % 04/18/22 1358  Vitals shown include unvalidated device data.  Last Pain:  Vitals:   04/18/22 1354  TempSrc:   PainSc: Asleep         Complications: No notable events documented.

## 2022-04-18 NOTE — Anesthesia Procedure Notes (Signed)
Procedure Name: Intubation Date/Time: 04/18/2022 12:09 PM  Performed by: Kyung Rudd, CRNAPre-anesthesia Checklist: Patient identified, Emergency Drugs available, Suction available and Patient being monitored Patient Re-evaluated:Patient Re-evaluated prior to induction Oxygen Delivery Method: Circle system utilized Preoxygenation: Pre-oxygenation with 100% oxygen Induction Type: IV induction, Rapid sequence and Cricoid Pressure applied Laryngoscope Size: Mac and 3 Grade View: Grade I Tube type: Oral Tube size: 7.0 mm Number of attempts: 1 Airway Equipment and Method: Stylet Placement Confirmation: ETT inserted through vocal cords under direct vision, positive ETCO2 and breath sounds checked- equal and bilateral Secured at: 20 cm Tube secured with: Tape Dental Injury: Teeth and Oropharynx as per pre-operative assessment

## 2022-04-18 NOTE — Op Note (Signed)
Preoperative diagnosis: Small bowel obstruction  Postoperative diagnosis same  Procedure: Exploratory laparotomy with lysis of adhesions and resection of small bowel stricture  Surgeon: Erroll Luna, MD  Anesthesia: General  EBL: 30 cc  Drains: None  Specimen: Small bowel stricture to pathology  Indications for procedure: The patient is a 56 year old female with history of gastric bypass.  She was seen last week due to what was felt small bowel obstruction but left AMA.  She returned Tuesday then again on Thursday of this week with recurrent bowel obstruction symptoms.  She was admitted.  Upon examination she had distention and signs on radiograph small bowel obstruction as well as CT scan.  Unfortunately she is try to eat but cannot get over this.  Recommended operative intervention since she has failed medical management.The procedure has been discussed with the patient.  Alternative therapies have been discussed with the patient.  Operative risks include bleeding,  Infection,  Organ injury,  Nerve injury,  Blood vessel injury,  DVT,  Pulmonary embolism,  Death,  And possible reoperation.  Bowel resection, ostomy, anastomotic breakdown, hernia, medical management risks include worsening of present situation.  The success of the procedure is 50 -90 % at treating patients symptoms.  The patient understands and agrees to proceed.      Description of procedure: The patient was met in the holding area and questions were answered.  She was then taken back to the operating room.  She was placed upon the OR table.  After induction of general esthesia, the abdomen was prepped and draped in sterile fashion and a Foley catheter was placed.  Timeout performed.  She received appropriate preoperative antibiotics.  Midline incision was used.  Dissection was carried down to the midline of the abdominal cavity is entered.  There is significant distended small bowel.  She had a previous Roux-en-Y gastric  bypass.  I found her gastric pouch and her a ferret limb.  E ferret limb identified as well.  The JJ was identified.  There is no complicating issues there.  I ran the small bowel and there were numerous adhesions to the pelvis.  She had a adhesion in her right lower quadrant where the bowel was looped through causing the obstruction.  I lysed this.  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  I ran the remainder of the small bowel to the cecum.  The colon was distended with air.  I elected to resect the strictured area since upon examination this was chronic and was afraid this would cause postoperative issues.  Using 2 GIA 75 staplers I divided the area of stricture proximal and distal.  The second was about 5 cm.  This was resected with LigaSure.  A side-to-side anastomosis was created using the GIA 75.  This was placed through 2 common enterotomies to make 1 common channel.  It was fired.  Hemostasis was good.  TX 60 used to close the common enterotomy.  I then imbricated the bowel over the anastomosis.  We closed the mesenteric defect.  This was all done with 3-0 Vicryl's.  There is ample room to the anastomosis to mental contents and no signs of leakage with interrogation of it.  A crotch stitch was placed with 3-0 Vicryl.  Irrigation was used.  There were no other significant abnormalities noted.  We then closed the fascia with double-stranded PDS #1.  Staples used to close skin.  All  counts found to be correct.  The patient was awoke extubated taken recovery in satisfactory condition after placement of honeycomb dressing. 

## 2022-04-18 NOTE — Progress Notes (Signed)
[facility]  PROGRESS NOTE  [name] DDU:[id number] DOB: [birth year] DOA: 04/17/2022 PCP: [name], MD   Brief HPI:    56 y.o. female with medical history significant of  Schizo affective d/o , COPD, Anemia, GERD, HTN, gastric bypass in 2017 by Dr. Hillary Bow recent interim history of  seizure s/p fall diagnosed 8/23.  In addition to history of  admission 7/23-8/23 for suicide attempt complicated by aspiration pneumonia with acute respiratory failure that was vent dependent. Had trach placed during this stay.  During this admission patient also developed colonic ileus and was seen by GI and placed on Linzess and lactulose.She was recently admitted on on 10/13-10/21 for SBO and was treated conservatively with small bowel protocol. She had persistent obstruction and left AMA on 10/21.  Patient presented to ED with complaints of abdominal pain.  CT abdomen/pelvis showed diffuse small bowel distention with mesenteric edema in the lower abdomen pelvis with cavitary interloop mesenteric fluid. Small bowel loop in the left pelvis shows fecalized enteric contents and is distended up to 3.9 cm diameter. Multiple nondilated loops in the right abdomen show apparent mural edema/thickening. As before, assessment is limited by lack of intra-abdominal fat. Imaging features could be related to ileus although distal small bowel obstruction cannot be excluded. No evidence for pneumatosis in the small bowel or colon. No intraperitoneal free air. No evidence for intraperitoneal abscess. Small volume free fluid in the pelvis. S/p gastric bypass. Stable T9 compression deformity.   General surgery was consulted  Subjective   Patient seen and examined, plan for surgery today.   Assessment/Plan:    Small bowel obstruction -General surgery consulted, NG tube placed -Surgery today  Hypokalemia -Potassium is 3.4 this morning -We will replace potassium and follow BMP in  am  Polysubstance abuse ([place]) +cocaine and alcohol abuse -UDS pending -CIWA protocol with IV thiamine and IV folic acid while NPO    Schizoaffective disorder, bipolar type ([place]) History of suicide attempt in 12/2021 Has not been taking medication per family No SI/HI.    Type 2 diabetes mellitus (HCC) A1C of 6.5 this month Hold metformin Sensitive SSI and accuchecks qac/hs while NPO  -CBG well controlled   Essential hypertension PRN IV labetalol until no longer NPO  Has not been compliant with medication    COPD (chronic obstructive pulmonary disease) (HCC) No s/s of exacerbation Continue SABA prn and symbicort    Seizure ([place]) Seizure precautions On no medication,  ? Alcohol withdrawal  Not on vimpat and has not been on this. Verified with family    GERD (gastroesophageal reflux disease) Continue protonix>IV   Memory loss daughter states that since her OD this summer she has had worsening memory decline She plans on outpatient neurology work up       Medications     nicotine  21 mg Transdermal Daily   pantoprazole (PROTONIX) IV  40 mg Intravenous Q24H   thiamine  100 mg Oral Daily   Or   thiamine  100 mg Intravenous Daily     Data Reviewed:   CBG:  Recent Labs  Lab 04/11/22 1150 04/11/22 1537  GLUCAP 119* 124*    SpO2: 97 %    Vitals:   04/17/22 2345 04/18/22 0012 04/18/22 0305 04/18/22 0805  BP: (!) 164/94 (!) 156/96 138/87 135/72  Pulse: (!) 106 (!) 103 95 82  Resp: '20 18 17 17  '$ Temp: 97.7 F (36.5 C) 99.1 F (37.3 C) 98.4 F (36.9 C) 98.4 F (36.9 C)  TempSrc: Oral Oral Axillary Oral  SpO2: 91% 92% 90% 97%  Weight:      Height:          Data Reviewed:  Basic Metabolic Panel: Recent Labs  Lab 04/15/22 1511 04/17/22 1018 04/18/22 0052  NA 134* 135 136  K 3.9 3.0* 3.4*  CL 101 102 102  CO2 '24 25 27  '$ GLUCOSE 92 94 86  BUN <5* <5* <5*  CREATININE 0.52 0.49 0.37*  CALCIUM 8.3* 7.9* 8.1*  MG  --  1.7  --     CBC: Recent  Labs  Lab 04/15/22 1511 04/17/22 1018 04/18/22 0052  WBC 8.3 7.3 9.7  NEUTROABS 5.2 4.0  --   HGB 14.3 12.9 13.0  HCT 42.1 38.1 39.7  MCV 88.8 88.6 88.6  PLT 369 352 348    LFT Recent Labs  Lab 04/15/22 1511 04/17/22 1018  AST 20 16  ALT 15 13  ALKPHOS 102 88  BILITOT 0.7 0.4  PROT 6.7 5.8*  ALBUMIN 3.1* 2.7*     Antibiotics: Anti-infectives (From admission, onward)    None        DVT prophylaxis: SCDs  Code Status: Full code  Family Communication: No family at bedside   CONSULTS General surgery   Objective    Physical Examination:   General-appears in no acute distress Heart-S1-S2, regular, no murmur auscultated Lungs-clear to auscultation bilaterally, no wheezing or crackles auscultated Abdomen-soft, nontender, no organomegaly Extremities-no edema in the lower extremities Neuro-alert, oriented x3, no focal deficit noted  Status is: Inpatient:             Oswald Hillock   [facility] If 7PM-7AM, please contact night-coverage at [email or web address], Office  [phone]   04/18/2022, 10:41 AM  LOS: 1 day

## 2022-04-19 ENCOUNTER — Encounter (HOSPITAL_COMMUNITY): Payer: Self-pay | Admitting: Surgery

## 2022-04-19 DIAGNOSIS — R109 Unspecified abdominal pain: Secondary | ICD-10-CM | POA: Diagnosis not present

## 2022-04-19 DIAGNOSIS — K56609 Unspecified intestinal obstruction, unspecified as to partial versus complete obstruction: Secondary | ICD-10-CM | POA: Diagnosis not present

## 2022-04-19 DIAGNOSIS — I10 Essential (primary) hypertension: Secondary | ICD-10-CM | POA: Diagnosis not present

## 2022-04-19 LAB — COMPREHENSIVE METABOLIC PANEL
ALT: 11 U/L (ref 0–44)
AST: 19 U/L (ref 15–41)
Albumin: 2.6 g/dL — ABNORMAL LOW (ref 3.5–5.0)
Alkaline Phosphatase: 104 U/L (ref 38–126)
Anion gap: 8 (ref 5–15)
BUN: <5 mg/dL — ABNORMAL LOW (ref 6–20)
CO2: 32 mmol/L (ref 22–32)
Calcium: 8.2 mg/dL — ABNORMAL LOW (ref 8.9–10.3)
Chloride: 96 mmol/L — ABNORMAL LOW (ref 98–111)
Creatinine, Ser: 0.62 mg/dL (ref 0.44–1.00)
GFR, Estimated: >60 mL/min (ref >60)
Glucose, Bld: 133 mg/dL — ABNORMAL HIGH (ref 70–99)
Potassium: 4.2 mmol/L (ref 3.5–5.1)
Sodium: 136 mmol/L (ref 135–145)
Total Bilirubin: 0.8 mg/dL (ref 0.3–1.2)
Total Protein: 6.3 g/dL — ABNORMAL LOW (ref 6.5–8.1)

## 2022-04-19 LAB — CBC
HCT: 44.2 % (ref 36.0–46.0)
Hemoglobin: 14.7 g/dL (ref 12.0–15.0)
MCH: 29.8 pg (ref 26.0–34.0)
MCHC: 33.3 g/dL (ref 30.0–36.0)
MCV: 89.7 fL (ref 80.0–100.0)
Platelets: 421 10*3/uL — ABNORMAL HIGH (ref 150–400)
RBC: 4.93 MIL/uL (ref 3.87–5.11)
RDW: 13.6 % (ref 11.5–15.5)
WBC: 16.5 10*3/uL — ABNORMAL HIGH (ref 4.0–10.5)
nRBC: 0 % (ref 0.0–0.2)

## 2022-04-19 MED ORDER — HALOPERIDOL 1 MG PO TABS: 2.0000 mg | ORAL_TABLET | Freq: Three times a day (TID) | ORAL | Status: DC | PRN

## 2022-04-19 MED ORDER — KETOROLAC TROMETHAMINE 30 MG/ML IJ SOLN
15.0000 mg | Freq: Four times a day (QID) | INTRAMUSCULAR | Status: AC
  Administered 2022-04-19 – 2022-04-24 (×20): 15 mg via INTRAVENOUS
  Filled 2022-04-19 (×20): qty 1

## 2022-04-19 MED ORDER — HALOPERIDOL LACTATE 5 MG/ML IJ SOLN
2.0000 mg | Freq: Three times a day (TID) | INTRAMUSCULAR | Status: DC | PRN
  Administered 2022-04-19 – 2022-04-20 (×3): 2 mg via INTRAMUSCULAR
  Filled 2022-04-19 (×3): qty 1

## 2022-04-19 MED ORDER — PHENOL 1.4 % MT LIQD: 1.0000 | OROMUCOSAL | Status: DC | PRN

## 2022-04-19 NOTE — Progress Notes (Signed)
Patient requiring additional PRN dose of ativan IV at this time. She is restless and disoriented to place, time and situation. She told me that she was going to go to the store "right out there" referring to the hospital hallway.  I assisted patient with ambulating a full lap around the unit, stopped by the restroom, and placed back in bed.  Patient is unsteady, drowsy and falling asleep while talking.

## 2022-04-19 NOTE — Consult Note (Signed)
Reason for Consult : Agitation Referred by: Eleonore Chiquito, MD Brief HPI obtained from chart because patient is unable to participate in psychiatric evaluation due to being extremely drowsy and sedated. Patient is a 56 y.o. female with history of Schizoaffective d/o , COPD, Anemia, GERD, HTN, gastric bypass in 2017 by Dr. Hillary Bow recent interim history of  seizure s/p fall diagnosed 8/23. Also, patient was admitted to the hospital from 7/23 to 8/23 for suicide attempt complicated by aspiration pneumonia with acute respiratory failure that was vent dependent. Had trach placed during this stay, developed colonic ileus and was seen by GI and placed on Linzess and lactulose.She was recently admitted on on 10/13-10/21 for SBO and was treated conservatively with small bowel protocol. She had persistent obstruction and left AMA on 10/21 and recently admitted to the hospital due to abdominal pain. Today, patient is unable to participate in psychiatric evaluation. However, staff reports that she has been getting agitated when she wakes up from sleeping. Medication review reveales that patient is on pain medication and Ativan detox protocol.  Recommendations: -Continue 1:1 sitter for safety -Continue CIWA and Lorazepam detox protocol  -Add Haldol 2 mg PO/IM every 8 hrs as needed for agitation -Pls monitor EKG to prevent QTc prolongation -Re-consult psychiatric when patient is able to participate is alert and awake.  Corena Pilgrim, MD Attending psychiatrist

## 2022-04-19 NOTE — Progress Notes (Signed)
[facility]  PROGRESS NOTE  [name] HBZ:[id number] DOB: 05-29-1966 DOA: 04/17/2022 PCP: [name], MD   Brief HPI:    56 y.o. female with medical history significant of  Schizo affective d/o , COPD, Anemia, GERD, HTN, gastric bypass in 2017 by Dr. Hillary Bow recent interim history of  seizure s/p fall diagnosed 8/23.  In addition to history of  admission 7/23-8/23 for suicide attempt complicated by aspiration pneumonia with acute respiratory failure that was vent dependent. Had trach placed during this stay.  During this admission patient also developed colonic ileus and was seen by GI and placed on Linzess and lactulose.She was recently admitted on on 10/13-10/21 for SBO and was treated conservatively with small bowel protocol. She had persistent obstruction and left AMA on 10/21.  Patient presented to ED with complaints of abdominal pain.  CT abdomen/pelvis showed diffuse small bowel distention with mesenteric edema in the lower abdomen pelvis with cavitary interloop mesenteric fluid. Small bowel loop in the left pelvis shows fecalized enteric contents and is distended up to 3.9 cm diameter. Multiple nondilated loops in the right abdomen show apparent mural edema/thickening. As before, assessment is limited by lack of intra-abdominal fat. Imaging features could be related to ileus although distal small bowel obstruction cannot be excluded. No evidence for pneumatosis in the small bowel or colon. No intraperitoneal free air. No evidence for intraperitoneal abscess. Small volume free fluid in the pelvis. S/p gastric bypass. Stable T9 compression deformity.   General surgery was consulted  Subjective   Patient seen and examined, NG tube in place.  S/p laparotomy with lysis of adhesions   Assessment/Plan:    Small bowel obstruction/sp laparotomy/lysis of adhesions -General surgery following, NG tube in place -continue dilaudid  PCA for pain  management  Hypokalemia -replete  Polysubstance abuse ([place]) +cocaine and alcohol abuse -UDS pending -CIWA protocol with IV thiamine and IV folic acid while NPO    Schizoaffective disorder, bipolar type ([place]) History of suicide attempt in 12/2021 Has not been taking medication per family No SI/HI.  -Patient has been getting agitated and wanted to leave AMA this morning -Patient is currently n.p.o., will not be good to give her oral psych meds from home -We will consult psychiatry for recommendations regarding management of agitation   Type 2 diabetes mellitus (HCC) A1C of 6.5 this month Hold metformin Sensitive SSI and accuchecks qac/hs while NPO  -CBG well controlled   Essential hypertension PRN IV labetalol until no longer NPO  Has not been compliant with medication    COPD (chronic obstructive pulmonary disease) (HCC) No s/s of exacerbation Continue SABA prn and symbicort    Seizure ([place]) Seizure precautions On no medication,  ? Alcohol withdrawal  Not on vimpat and has not been on this. Verified with family    GERD (gastroesophageal reflux disease) Continue protonix>IV   Memory loss daughter states that since her OD this summer she has had worsening memory decline She plans on outpatient neurology work up       Medications     HYDROmorphone   Intravenous Q4H   nicotine  21 mg Transdermal Daily   pantoprazole (PROTONIX) IV  40 mg Intravenous Q24H   thiamine  100 mg Oral Daily   Or   thiamine  100 mg Intravenous Daily     Data Reviewed:   CBG:  Recent Labs  Lab 04/18/22 1402  GLUCAP 151*    SpO2: 100 % O2 Flow Rate (L/min): 2 L/min  Vitals:   04/19/22 0305 04/19/22 0309 04/19/22 0732 04/19/22 0747  BP: (!) 133/94   129/78  Pulse: (!) 110     Resp: 17 16 (!) 23 (!) 24  Temp: 98.6 F (37 C)   98.6 F (37 C)  TempSrc: Axillary     SpO2: 100%  97% 100%  Weight:      Height:          Data Reviewed:  Basic Metabolic Panel: Recent  Labs  Lab 04/15/22 1511 04/17/22 1018 04/18/22 0052 04/19/22 0324  NA 134* 135 136 136  K 3.9 3.0* 3.4* 4.2  CL 101 102 102 96*  CO2 '24 25 27 '$ 32  GLUCOSE 92 94 86 133*  BUN <5* <5* <5* <5*  CREATININE 0.52 0.49 0.37* 0.62  CALCIUM 8.3* 7.9* 8.1* 8.2*  MG  --  1.7  --   --     CBC: Recent Labs  Lab 04/15/22 1511 04/17/22 1018 04/18/22 0052 04/19/22 0324  WBC 8.3 7.3 9.7 16.5*  NEUTROABS 5.2 4.0  --   --   HGB 14.3 12.9 13.0 14.7  HCT 42.1 38.1 39.7 44.2  MCV 88.8 88.6 88.6 89.7  PLT 369 352 348 421*    LFT Recent Labs  Lab 04/15/22 1511 04/17/22 1018 04/19/22 0324  AST '20 16 19  '$ ALT '15 13 11  '$ ALKPHOS 102 88 104  BILITOT 0.7 0.4 0.8  PROT 6.7 5.8* 6.3*  ALBUMIN 3.1* 2.7* 2.6*     Antibiotics: Anti-infectives (From admission, onward)    Start     Dose/Rate Route Frequency Ordered Stop   04/18/22 1100  cefoTEtan (CEFOTAN) 2 g in sodium chloride 0.9 % 100 mL IVPB        2 g 200 mL/hr over 30 Minutes Intravenous On call to O.R. 04/18/22 1053 04/19/22 0840   04/18/22 1059  sodium chloride 0.9 % with cefoTEtan (CEFOTAN) ADS Med       Note to Pharmacy: Harden Mo E: cabinet override      04/18/22 1059 04/18/22 1243        DVT prophylaxis: SCDs  Code Status: Full code  Family Communication: Discussed with daughter on phone   CONSULTS General surgery   Objective    Physical Examination:  General-appears in no acute distress Heart-S1-S2, regular, no murmur auscultated Lungs-clear to auscultation bilaterally, no wheezing or crackles auscultated Abdomen-soft, nontender, no organomegaly Extremities-no edema in the lower extremities Neuro-alert, oriented x3, no focal deficit noted   Status is: Inpatient:             Oswald Hillock   [facility] If 7PM-7AM, please contact night-coverage at [email or web address], Office  [phone]   04/19/2022, 8:57 AM  LOS: 2 days

## 2022-04-19 NOTE — Progress Notes (Signed)
1 Day Post-Op   Subjective/Chief Complaint: Patient awake, alert, mildly agitated Wants to have liquids to drink - sore throat No flatus yet   Objective: Vital signs in last 24 hours: Temp:  [97.5 F (36.4 C)-99.1 F (37.3 C)] 98.6 F (37 C) (10/28 0747) Pulse Rate:  [80-115] 110 (10/28 0305) Resp:  [10-24] 24 (10/28 0747) BP: (124-148)/(77-94) 129/78 (10/28 0747) SpO2:  [91 %-100 %] 100 % (10/28 0747) Last BM Date :  (PTA)  Intake/Output from previous day: 10/27 0701 - 10/28 0700 In: 2205.5 [I.V.:2055.5; IV Piggyback:150] Out: 720 [Urine:700; Blood:20] Intake/Output this shift: Total I/O In: 10 [NG/GT:10] Out: -   WDWN in NAD Abd- soft, minimally distended; incisional tenderness Honeycomb dressing dry - staple line c/d/i  Lab Results:  Recent Labs    04/18/22 0052 04/19/22 0324  WBC 9.7 16.5*  HGB 13.0 14.7  HCT 39.7 44.2  PLT 348 421*   BMET Recent Labs    04/18/22 0052 04/19/22 0324  NA 136 136  K 3.4* 4.2  CL 102 96*  CO2 27 32  GLUCOSE 86 133*  BUN <5* <5*  CREATININE 0.37* 0.62  CALCIUM 8.1* 8.2*   PT/INR Recent Labs    04/17/22 1828  LABPROT 14.4  INR 1.1   ABG No results for input(s): "PHART", "HCO3" in the last 72 hours.  Invalid input(s): "PCO2", "PO2"  Studies/Results: DG Abdomen 1 View  Result Date: 04/17/2022 CLINICAL DATA:  Status post NG placement. EXAM: ABDOMEN - 1 VIEW COMPARISON:  Earlier radiograph dated 04/17/2022. FINDINGS: Interval advancement of the enteric tube with tip in the left upper abdomen, likely in the region of the gastric body. Dilated air-filled loops of small bowel again noted. IMPRESSION: Interval advancement of the enteric tube with tip in the region of the gastric body. Electronically Signed   By: Anner Crete M.D.   On: 04/17/2022 23:58   DG Abd Portable 1V  Result Date: 04/17/2022 CLINICAL DATA:  Enteric catheter placement EXAM: PORTABLE ABDOMEN - 1 VIEW COMPARISON:  04/11/2022 FINDINGS: Frontal  view of the lower chest and upper abdomen demonstrates enteric catheter passing below diaphragm, tip projecting over the gastric body and side port projecting at the gastroesophageal junction. Persistent gaseous distention of the bowel, with slight decrease in caliber since prior x-ray. Lung bases are clear. IMPRESSION: 1. Enteric catheter tip projecting over the gastric body. Electronically Signed   By: Randa Ngo M.D.   On: 04/17/2022 16:09   CT ABDOMEN PELVIS W CONTRAST  Result Date: 04/17/2022 CLINICAL DATA:  Small bowel obstruction. EXAM: CT ABDOMEN AND PELVIS WITH CONTRAST TECHNIQUE: Multidetector CT imaging of the abdomen and pelvis was performed using the standard protocol following bolus administration of intravenous contrast. RADIATION DOSE REDUCTION: This exam was performed according to the departmental dose-optimization program which includes automated exposure control, adjustment of the mA and/or kV according to patient size and/or use of iterative reconstruction technique. COMPARISON:  04/15/2022 FINDINGS: Lower chest: Unremarkable. Hepatobiliary: Insert normal infused liver There is no evidence for gallstones, gallbladder wall thickening, or pericholecystic fluid. No intrahepatic or extrahepatic biliary dilation. Pancreas: No focal mass lesion. No dilatation of the main duct. No intraparenchymal cyst. No peripancreatic edema. Spleen: No splenomegaly. No focal mass lesion. Adrenals/Urinary Tract: No adrenal nodule or mass. Kidneys unremarkable. No evidence for hydroureter. The urinary bladder appears normal for the degree of distention. Stomach/Bowel: Status post gastric bypass. Similar appearance of diffuse small bowel distension. Terminal ileum is decompressed. There is mesenteric edema in the  lower abdomen and pelvis with scattered areas of interloop mesenteric fluid. Small bowel loop in the left pelvis shows fecalized enteric contents and is distended up to 3.9 cm diameter. Multiple  nondilated loops in the right abdomen show apparent mural edema/thickening. Old no evidence for pneumatosis. Colon is diffusely distended with gas and stool. Vascular/Lymphatic: There is mild atherosclerotic calcification of the abdominal aorta without aneurysm. There is no gastrohepatic or hepatoduodenal ligament lymphadenopathy. No retroperitoneal or mesenteric lymphadenopathy. No pelvic sidewall lymphadenopathy. Reproductive: No adnexal mass. Other: Small volume free fluid seen in the pelvis. Musculoskeletal: Small bilateral groin hernias contain only fat. No worrisome lytic or sclerotic osseous abnormality. T9 compression deformity is stable. IMPRESSION: 1. Similar appearance of diffuse small bowel distension with mesenteric edema in the lower abdomen and pelvis with scattered areas of interloop mesenteric fluid. Small bowel loop in the left pelvis shows fecalized enteric contents and is distended up to 3.9 cm diameter. Multiple nondilated loops in the right abdomen show apparent mural edema/thickening. As before, assessment is limited by lack of intra-abdominal fat. Imaging features could be related to ileus although distal small bowel obstruction cannot be excluded. No evidence for pneumatosis in the small bowel or colon. No intraperitoneal free air. No evidence for intraperitoneal abscess. 2. Small volume free fluid in the pelvis. 3. Status post gastric bypass. 4. Stable T9 compression deformity. 5.  Aortic Atherosclerosis (ICD10-I70.0). Electronically Signed   By: Misty Stanley M.D.   On: 04/17/2022 13:02    Anti-infectives: Anti-infectives (From admission, onward)    Start     Dose/Rate Route Frequency Ordered Stop   04/18/22 1100  cefoTEtan (CEFOTAN) 2 g in sodium chloride 0.9 % 100 mL IVPB        2 g 200 mL/hr over 30 Minutes Intravenous On call to O.R. 04/18/22 1053 04/19/22 0840   04/18/22 1059  sodium chloride 0.9 % with cefoTEtan (CEFOTAN) ADS Med       Note to Pharmacy: Harden Mo E:  cabinet override      04/18/22 1059 04/18/22 1243       Assessment/Plan: SBO H/o gastric bypass 2017 Dr. Excell Seltzer, history of laparoscopic hysterectomy - CT 10/13 w/ SBO and TP poster pelvis. Mild twisting of mesentery within RLQ. Small HH -Admitted to hospitalist service 10/13 through 10/20 for SBO managed with conservative treatment.  NG tube removed during admission but obstruction did not completely resolve.  She left AMA 10/20 after recommendation for continued n.p.o. status and NG tube replacement -CT 10/24 and today (10/26) done without oral contrast showing diffuse small bowel distention concerning for ileus versus SBO however fever pSBO given history Exploratory laparotomy/ small bowel resection/ lysis of adhesions 04/18/22 - Dr. Brantley Stage Small bowel stricture    Awaiting return of bowel function Add Toradol to pain regimen with PCA Ice chips Chloraseptic spray Ambulate as tolerated   FEN: NPO, NGT LIWS, IVF per primary ID: none VTE: Okay for chemical prophylaxis from surgical perspective    Alcohol use Tobacco use DM GERD COPD Afib  HTN  LOS: 2 days    Maia Petties 04/19/2022

## 2022-04-19 NOTE — Progress Notes (Signed)
[name] continues to be noncompliant with safety precautions. Patient is attempting to get out of bed unassisted, dislodge NT tube, she is unable to be redirected at this time - IM haldol given. She is disoriented to place,time and situation.

## 2022-04-19 NOTE — Progress Notes (Signed)
Pt extremely drowsy, in and out of sleep, asking to leave AMA, pt educated on the importance of staying & that she is on strong pain medications that are making her drowsy and it is not currently safe to go home, pt then attempting to get OOB, this RN gave pt ativan to help ease her anxiety  After ativan given, pt got OOB stating she was going to pull everything out due to not "feeling" the pain medicine, pt called daughter & said she wanted to leave, paged NP on called, Pt was eventually redirected and got in bed. Set bed alarm, bed in lowest position call bell in reach, PCA button in reach

## 2022-04-19 NOTE — Progress Notes (Signed)
[name] is requiring a safety-sitter to reduce potential for falls/tube dislodgement. She will not comply with safety precautions, getting out of bed unassisted multiple times. Patient has a NGT, IV, Cardiac monitor and 02. She is unsteady, easily redirectable.  NT pulled from unit staffing to sit with patient.

## 2022-04-19 NOTE — Progress Notes (Signed)
Pt NG tube is dislodged and will not allow this RN to attempted to push it back in, will continue to attempt

## 2022-04-20 DIAGNOSIS — F1910 Other psychoactive substance abuse, uncomplicated: Secondary | ICD-10-CM

## 2022-04-20 DIAGNOSIS — J449 Chronic obstructive pulmonary disease, unspecified: Secondary | ICD-10-CM | POA: Diagnosis not present

## 2022-04-20 DIAGNOSIS — R109 Unspecified abdominal pain: Secondary | ICD-10-CM | POA: Diagnosis not present

## 2022-04-20 DIAGNOSIS — I10 Essential (primary) hypertension: Secondary | ICD-10-CM | POA: Diagnosis not present

## 2022-04-20 DIAGNOSIS — R413 Other amnesia: Secondary | ICD-10-CM

## 2022-04-20 DIAGNOSIS — K56609 Unspecified intestinal obstruction, unspecified as to partial versus complete obstruction: Secondary | ICD-10-CM | POA: Diagnosis not present

## 2022-04-20 LAB — COMPREHENSIVE METABOLIC PANEL
ALT: 11 U/L (ref 0–44)
AST: 14 U/L — ABNORMAL LOW (ref 15–41)
Albumin: 2.2 g/dL — ABNORMAL LOW (ref 3.5–5.0)
Alkaline Phosphatase: 81 U/L (ref 38–126)
Anion gap: 8 (ref 5–15)
BUN: <5 mg/dL — ABNORMAL LOW (ref 6–20)
CO2: 31 mmol/L (ref 22–32)
Calcium: 8.1 mg/dL — ABNORMAL LOW (ref 8.9–10.3)
Chloride: 99 mmol/L (ref 98–111)
Creatinine, Ser: 0.4 mg/dL — ABNORMAL LOW (ref 0.44–1.00)
GFR, Estimated: >60 mL/min (ref >60)
Glucose, Bld: 83 mg/dL (ref 70–99)
Potassium: 3.6 mmol/L (ref 3.5–5.1)
Sodium: 138 mmol/L (ref 135–145)
Total Bilirubin: 0.5 mg/dL (ref 0.3–1.2)
Total Protein: 5 g/dL — ABNORMAL LOW (ref 6.5–8.1)

## 2022-04-20 MED ORDER — ENOXAPARIN SODIUM 40 MG/0.4ML IJ SOSY
40.0000 mg | PREFILLED_SYRINGE | INTRAMUSCULAR | Status: DC
  Administered 2022-04-20 – 2022-04-22 (×3): 40 mg via SUBCUTANEOUS
  Filled 2022-04-20 (×4): qty 0.4

## 2022-04-20 NOTE — Progress Notes (Signed)
2 Days Post-Op   Subjective/Chief Complaint: Patient resting comfortably - had some agitation yesterday Receiving Haldol in addition to CIWA protocol No recorded BM yet Minimal NG output recorded   Objective: Vital signs in last 24 hours: Pulse Rate:  [80-118] 90 (10/29 0900) Resp:  [14-23] 20 (10/29 0808) BP: (100-112)/(60-70) 111/67 (10/29 0707) SpO2:  [91 %-100 %] 95 % (10/29 0808) Last BM Date :  (PTA)  Intake/Output from previous day: 10/28 0701 - 10/29 0700 In: 10 [NG/GT:10] Out: 5 [Emesis/NG output:5] Intake/Output this shift: Total I/O In: 75 [P.O.:75] Out: 600 [Urine:600]  WDWN - sleeping soundly Abd - soft, mildly distended; incisional tenderness Staple line c/d/i  Lab Results:  Recent Labs    04/18/22 0052 04/19/22 0324  WBC 9.7 16.5*  HGB 13.0 14.7  HCT 39.7 44.2  PLT 348 421*   BMET Recent Labs    04/19/22 0324 04/20/22 0320  NA 136 138  K 4.2 3.6  CL 96* 99  CO2 32 31  GLUCOSE 133* 83  BUN <5* <5*  CREATININE 0.62 0.40*  CALCIUM 8.2* 8.1*   PT/INR Recent Labs    04/17/22 1828  LABPROT 14.4  INR 1.1   ABG No results for input(s): "PHART", "HCO3" in the last 72 hours.  Invalid input(s): "PCO2", "PO2"  Studies/Results: No results found.  Anti-infectives: Anti-infectives (From admission, onward)    Start     Dose/Rate Route Frequency Ordered Stop   04/18/22 1100  cefoTEtan (CEFOTAN) 2 g in sodium chloride 0.9 % 100 mL IVPB        2 g 200 mL/hr over 30 Minutes Intravenous On call to O.R. 04/18/22 1053 04/19/22 0840   04/18/22 1059  sodium chloride 0.9 % with cefoTEtan (CEFOTAN) ADS Med       Note to Pharmacy: Harden Mo E: cabinet override      04/18/22 1059 04/18/22 1243       Assessment/Plan: SBO H/o gastric bypass 2017 Dr. Excell Seltzer, history of laparoscopic hysterectomy - CT 10/13 w/ SBO and TP poster pelvis. Mild twisting of mesentery within RLQ. Small HH -Admitted to hospitalist service 10/13 through 10/20 for SBO  managed with conservative treatment.  NG tube removed during admission but obstruction did not completely resolve.  She left AMA 10/20 after recommendation for continued n.p.o. status and NG tube replacement -CT 10/24 and 10/26 done without oral contrast showing diffuse small bowel distention concerning for ileus versus SBO however fever pSBO given history Exploratory laparotomy/ small bowel resection/ lysis of adhesions 04/18/22 - Dr. Brantley Stage Small bowel stricture     Awaiting return of bowel function Add Toradol to pain regimen with PCA Ice chips Chloraseptic spray Ambulate as tolerated   FEN: NPO, NGT LIWS, IVF per primary ID: none VTE: Okay for chemical prophylaxis from surgical perspective    Alcohol use Tobacco use DM GERD COPD Afib  HTN  LOS: 3 days    [name] 04/20/2022

## 2022-04-20 NOTE — Progress Notes (Signed)
[facility]  PROGRESS NOTE  [name] QQV:[id number] DOB: Oct 24, 1965 DOA: 04/17/2022 PCP: [name] Ebbs, MD   Brief HPI:    56 y.o. female with medical history significant of  Schizo affective d/o , COPD, Anemia, GERD, HTN, gastric bypass in 2017 by Dr. Hillary [name] recent interim history of  seizure s/p fall diagnosed 8/23.  In addition to history of  admission 7/23-8/23 for suicide attempt complicated by aspiration pneumonia with acute respiratory failure that was vent dependent. Had trach placed during this stay.  During this admission patient also developed colonic ileus and was seen by GI and placed on Linzess and lactulose.She was recently admitted on on 10/13-10/21 for SBO and was treated conservatively with small bowel protocol. She had persistent obstruction and left AMA on 10/21.  Patient presented to ED with complaints of abdominal pain.  CT abdomen/pelvis showed diffuse small bowel distention with mesenteric edema in the lower abdomen pelvis with cavitary interloop mesenteric fluid. Small bowel loop in the left pelvis shows fecalized enteric contents and is distended up to 3.9 cm diameter. Multiple nondilated loops in the right abdomen show apparent mural edema/thickening. As before, assessment is limited by lack of intra-abdominal fat. Imaging features could be related to ileus although distal small bowel obstruction cannot be excluded. No evidence for pneumatosis in the small bowel or colon. No intraperitoneal free air. No evidence for intraperitoneal abscess. Small volume free fluid in the pelvis. S/p gastric bypass. Stable T9 compression deformity.   General surgery was consulted  Subjective   Patient seen and examined, NG tube in place.  Little somnolent but arousable.   Assessment/Plan:    Small bowel obstruction/sp laparotomy/lysis of adhesions -General surgery following, NG tube in place -continue dilaudid  PCA for pain  management  Hypokalemia -replete  Polysubstance abuse ([name]) +cocaine and alcohol abuse -UDS pending -CIWA protocol with IV thiamine and IV folic acid while NPO    Schizoaffective disorder, bipolar type ([name]) History of suicide attempt in 12/2021 Has not been taking medication per family No SI/HI.  -Patient has been getting agitated and wanted to leave AMA this morning -Patient is currently n.p.o., will not be good to give her oral psych meds from home -Psychiatry was consulted, patient started on Haldol 2 mg p.o./IM every 8 hours as needed for agitation; recommend to continue with sitter one-to-one for safety    Type 2 diabetes mellitus (HCC) A1C of 6.5 this month Hold metformin Sensitive SSI and accuchecks qac/hs while NPO  -CBG well controlled   Essential hypertension PRN IV labetalol until no longer NPO  Has not been compliant with medication    COPD (chronic obstructive pulmonary disease) (HCC) No s/s of exacerbation Continue SABA prn and symbicort    Seizure ([name]) Seizure precautions On no medication,  ? Alcohol withdrawal  Not on vimpat and has not been on this. Verified with family    GERD (gastroesophageal reflux disease) Continue protonix>IV   Memory loss daughter states that since her OD this summer she has had worsening memory decline She plans on outpatient neurology work up       Medications     HYDROmorphone   Intravenous Q4H   ketorolac  15 mg Intravenous Q6H   nicotine  21 mg Transdermal Daily   pantoprazole (PROTONIX) IV  40 mg Intravenous Q24H   thiamine  100 mg Oral Daily   Or   thiamine  100 mg Intravenous Daily     Data Reviewed:   CBG:  Recent Labs  Lab 04/18/22 1402  GLUCAP 151*    SpO2: 95 % O2 Flow Rate (L/min): 3 L/min    Vitals:   04/20/22 0707 04/20/22 0800 04/20/22 0808 04/20/22 0900  BP: 111/67     Pulse: 90 93  90  Resp:   20   Temp:      TempSrc: Oral     SpO2: 93%  95%   Weight:      Height:           Data Reviewed:  [name] Metabolic Panel: Recent Labs  Lab 04/15/22 1511 04/17/22 1018 04/18/22 0052 04/19/22 0324 04/20/22 0320  NA 134* 135 136 136 138  K 3.9 3.0* 3.4* 4.2 3.6  CL 101 102 102 96* 99  CO2 '24 25 27 '$ 32 31  GLUCOSE 92 94 86 133* 83  BUN <5* <5* <5* <5* <5*  CREATININE 0.52 0.49 0.37* 0.62 0.40*  CALCIUM 8.3* 7.9* 8.1* 8.2* 8.1*  MG  --  1.7  --   --   --     CBC: Recent Labs  Lab 04/15/22 1511 04/17/22 1018 04/18/22 0052 04/19/22 0324  WBC 8.3 7.3 9.7 16.5*  NEUTROABS 5.2 4.0  --   --   HGB 14.3 12.9 13.0 14.7  HCT 42.1 38.1 39.7 44.2  MCV 88.8 88.6 88.6 89.7  PLT 369 352 348 421*    LFT Recent Labs  Lab 04/15/22 1511 04/17/22 1018 04/19/22 0324 04/20/22 0320  AST '20 16 19 '$ 14*  ALT '15 13 11 11  '$ ALKPHOS 102 88 104 81  BILITOT 0.7 0.4 0.8 0.5  PROT 6.7 5.8* 6.3* 5.0*  ALBUMIN 3.1* 2.7* 2.6* 2.2*     Antibiotics: Anti-infectives (From admission, onward)    Start     Dose/Rate Route Frequency Ordered Stop   04/18/22 1100  cefoTEtan (CEFOTAN) 2 g in sodium chloride 0.9 % 100 mL IVPB        2 g 200 mL/hr over 30 Minutes Intravenous On call to O.R. 04/18/22 1053 04/19/22 0840   04/18/22 1059  sodium chloride 0.9 % with cefoTEtan (CEFOTAN) ADS Med       Note to Pharmacy: Harden Mo E: cabinet override      04/18/22 1059 04/18/22 1243        DVT prophylaxis: SCDs  Code Status: Full code  Family Communication: Discussed with daughter on phone   CONSULTS General surgery   Objective    Physical Examination:   General: Appears lethargic Cardiovascular: S1-S2, regular, no murmurs, rubs or gallops Respiratory: Clear to auscultation bilaterally Abdomen: Soft, appropriate tenderness to palpation Extremities: No edema in the lower extremities Neurologic: Somnolent but arousable, no focal deficit noted   Status is: Inpatient:             [name]   [facility] If 7PM-7AM, please contact  night-coverage at [email or web address], Office  [phone]   04/20/2022, 10:48 AM  LOS: 3 days

## 2022-04-21 DIAGNOSIS — K56609 Unspecified intestinal obstruction, unspecified as to partial versus complete obstruction: Secondary | ICD-10-CM | POA: Diagnosis not present

## 2022-04-21 DIAGNOSIS — E876 Hypokalemia: Secondary | ICD-10-CM

## 2022-04-21 DIAGNOSIS — R109 Unspecified abdominal pain: Secondary | ICD-10-CM | POA: Diagnosis not present

## 2022-04-21 LAB — CBC
HCT: 35.7 % — ABNORMAL LOW (ref 36.0–46.0)
Hemoglobin: 11.7 g/dL — ABNORMAL LOW (ref 12.0–15.0)
MCH: 29.7 pg (ref 26.0–34.0)
MCHC: 32.8 g/dL (ref 30.0–36.0)
MCV: 90.6 fL (ref 80.0–100.0)
Platelets: 252 10*3/uL (ref 150–400)
RBC: 3.94 MIL/uL (ref 3.87–5.11)
RDW: 13.4 % (ref 11.5–15.5)
WBC: 7.6 10*3/uL (ref 4.0–10.5)
nRBC: 0 % (ref 0.0–0.2)

## 2022-04-21 LAB — TYPE AND SCREEN
ABO/RH(D): A POS
Antibody Screen: NEGATIVE
Unit division: 0
Unit division: 0
Unit division: 0

## 2022-04-21 LAB — BPAM RBC
Blood Product Expiration Date: 202311222359
Blood Product Expiration Date: 202311222359
Blood Product Expiration Date: 202311222359
Unit Type and Rh: 6200
Unit Type and Rh: 6200
Unit Type and Rh: 6200

## 2022-04-21 LAB — SURGICAL PATHOLOGY

## 2022-04-21 MED ORDER — HALOPERIDOL LACTATE 5 MG/ML IJ SOLN
2.0000 mg | Freq: Three times a day (TID) | INTRAMUSCULAR | Status: DC
  Administered 2022-04-21 – 2022-04-24 (×4): 2 mg via INTRAMUSCULAR
  Filled 2022-04-21 (×5): qty 1

## 2022-04-21 MED ORDER — HALOPERIDOL 1 MG PO TABS
2.0000 mg | ORAL_TABLET | Freq: Three times a day (TID) | ORAL | Status: DC
  Administered 2022-04-21 – 2022-04-29 (×20): 2 mg via ORAL
  Filled 2022-04-21 (×21): qty 2

## 2022-04-21 NOTE — Care Management Important Message (Signed)
Important Message  Patient Details  Name: DEETYA DROUILLARD MRN: [id number] Date of Birth: 03-04-1966   Medicare Important Message Given:        [name] 04/21/2022, 11:51 AM

## 2022-04-21 NOTE — Progress Notes (Signed)
Mobility Specialist Progress Note   04/21/22 1712  Mobility  Activity Ambulated with assistance in hallway;Ambulated with assistance to bathroom  Level of Assistance Contact guard assist, steadying assist  Assistive Device Front wheel walker  Distance Ambulated (ft) 480 ft  Activity Response Tolerated well  $Mobility charge 1 Mobility   Pre Mobility: 102 HR, 95% SpO2 During Mobility: 113 HR, 88% SpO2 Post Mobility: 108 HR, 163/96 BP, 95% SpO2  Received in bed expressing being hungry and thirsty but agreeable to mobility. Stand by assist for bed mobility. Requesting to use BR before session, successful void. Contact guard for ambulation, No incidents during but SpO2 ranging from 88% - 92% w/ a momentary drop to 85% while on RA. Pt stating no complaints throughout. Returned back to bed w/ bed alarm + posey belt on.     [place] Specialist Acute Rehab Office:  [phone]

## 2022-04-21 NOTE — Consult Note (Signed)
[facility] Face-to-Face Psychiatry Consult   Reason for Consult: Agitation Referring Physician: Dr. Darrick Meigs Patient Identification: [name] MRN:  [id number] Principal Diagnosis: Small bowel obstruction ([place]) Diagnosis:  Principal Problem:   Small bowel obstruction ([place]) Active Problems:   COPD (chronic obstructive pulmonary disease) ([place])   Hypokalemia   Essential hypertension   Type 2 diabetes mellitus (HCC)   Schizoaffective disorder, bipolar type ([place])   Polysubstance abuse ([place])   GERD (gastroesophageal reflux disease)   Seizure ([address])   Memory loss   Total Time spent with patient: 1 hour  Subjective:   [name] is a 56 y.o. female patient admitted with small bowel obstruction.  Today upon evaluation patient is appropriate and alert. She appears to be engaging well with staff, family and Probation officer. Patient smiles and greets this Probation officer, she is jovial and pleasant. She reports being out of restraints since yesterday, and would like to have waist restraint removed.  Patient furthermore apologizes for her behavior over the weekend, that resulted in increased agitation and as needed medication. She acknowledges her weaknesses, and how she been able to identify behavior techniques to help with her return home.  Patient reports noncompliance with her medication, due to no provider being available to refill.  She states since her last discharge from [facility], she has been at home living with her brother.  She denied alcohol and substance use to me, however did endorse drinking (2) 40 ounce beers daily to another provider.  As noted above, she identified factors that lead to her admission to include isolation, not communicating well with family, medication noncompliance, and increase in pain. She reports her goal today is to work on agitation, and increase in her diet. "I am so hungry.  I have not eaten since Wednesday.  Please get me something to eat or drink!"   She reports much improvement  of symptoms at this time as evident by her interaction with team, decrease in agitation symptoms and anxiety.  Daughter who is at the bedside states " I could not believe she called me this morning.  And sounded somewhat better.  She told me she was hungry, and use her phone to call me.  I was so surprised. "  She currently rates her depression and anxiety 0/10 with 10 being the worse.   She denies any suicidal thoughts, homicidal thoughts, and or auditory visual hallucinations.  She does not appear to be responding to internal stimuli.  She has had no urges to self-harm while on the unit.  She is able to contract for safety at this time.  Patient also communicated with another provider" please IVC me if I attempt to leave with life-threatening conditions.  I want to stay in the hospital until I am better."   HPI:  Patient is a 56 y.o. female with history of Schizoaffective d/o , COPD, Anemia, GERD, HTN, gastric bypass in 2017 by Dr. Hillary Bow recent interim history of  seizure s/p fall diagnosed 8/23. Also, patient was admitted to the hospital from 7/23 to 8/23 for suicide attempt complicated by aspiration pneumonia with acute respiratory failure that was vent dependent. Had trach placed during this stay, developed colonic ileus and was seen by GI and placed on Linzess and lactulose.She was recently admitted on on 10/13-10/21 for SBO and was treated conservatively with small bowel protocol. She had persistent obstruction and left AMA on 10/21 and recently admitted to the hospital due to abdominal pain. Today, patient is unable to participate  in psychiatric evaluation. However, staff reports that she has been getting agitated when she wakes up from sleeping. Medication review reveales that patient is on pain medication and Ativan detox protocol.  Past Psychiatric History: Polysubstance abuse, Depression, Schizoaffective;Bipolar type,  Previous dx's of schizophrenia, bipolar - Multiple inpatient stays (5)  [facility] [place], [facility]; last inpatient admission [facility] in 01/2022 after suicide attempt - Follows up at [facility], previously had ACT team - hx of suicide attempt x2 via overdose as a teen, 12/2021 by OD  Risk to Self:  None at this time Risk to Others:  Denies Prior Inpatient Therapy:  See above Prior Outpatient Therapy:   History of [facility],ACT team.   Past Medical History:  Past Medical History:  Diagnosis Date   Abdominal pain    Accidental drug overdose April 2013   Anxiety    Atrial fibrillation ([place]) 09/29/11   converted spontaneously   Chronic back pain    Chronic knee pain    Chronic nausea    Chronic pain    COPD (chronic obstructive pulmonary disease) ([place])    Depression    Diabetes mellitus    states her doctor took her off all DM meds in past month   Diabetic neuropathy ([place])    Dyspnea    with exertion    GERD (gastroesophageal reflux disease)    Headache(784.0)    migraines    HTN (hypertension)    not on meds since in a year    Hyperlipidemia    Hypothyroidism    not on meds in a while    Mental disorder    Bipolar and schizophrenic   Requires supplemental oxygen    as needed per patient    Schizophrenia ([place])    Schizophrenia, acute ([place]) 11/13/2017   Tobacco abuse     Past Surgical History:  Procedure Laterality Date   ABDOMINAL HYSTERECTOMY     BLADDER SUSPENSION  03/04/2011   Procedure: [facility] PROCEDURE;  Surgeon: Elayne Snare MacDiarmid;  Location: [facility];  Service: Urology;  Laterality: N/A;   BOWEL RESECTION N/A 04/18/2022   Procedure: SMALL BOWEL RESECTION;  Surgeon: Erroll Luna, MD;  Location: [facility];  Service: General;  Laterality: N/A;   CYSTOCELE REPAIR  03/04/2011   Procedure: ANTERIOR REPAIR (CYSTOCELE);  Surgeon: Reece Packer;  Location: [facility];  Service: Urology;  Laterality: N/A;   CYSTOSCOPY  03/04/2011   Procedure: CYSTOSCOPY;  Surgeon: Elayne Snare MacDiarmid;  Location: [facility];  Service: Urology;  Laterality:  N/A;   ESOPHAGOGASTRODUODENOSCOPY (EGD) WITH PROPOFOL N/A 05/12/2017   Procedure: ESOPHAGOGASTRODUODENOSCOPY (EGD) WITH PROPOFOL;  Surgeon: Alphonsa Overall, MD;  Location: [facility];  Service: General;  Laterality: N/A;   GASTRIC ROUX-EN-Y N/A 03/25/2016   Procedure: LAPAROSCOPIC ROUX-EN-Y GASTRIC BYPASS WITH UPPER ENDOSCOPY;  Surgeon: Excell Seltzer, MD;  Location: [facility];  Service: General;  Laterality: N/A;   KNEE SURGERY     LAPAROSCOPIC ASSISTED VAGINAL HYSTERECTOMY  03/04/2011   Procedure: LAPAROSCOPIC ASSISTED VAGINAL HYSTERECTOMY;  Surgeon: Cyril Mourning, MD;  Location: [facility];  Service: Gynecology;  Laterality: N/A;   LAPAROTOMY N/A 04/18/2022   Procedure: EXPLORATORY LAPAROTOMY;  Surgeon: Erroll Luna, MD;  Location: [facility];  Service: General;  Laterality: N/A;   Family History:  Family History  Problem Relation Age of Onset   Heart attack Father        17s   Diabetes Mother    Heart disease Mother    Hypertension Mother  Heart attack Sister        60   COPD Other    Breast cancer Neg Hx    Family Psychiatric  History: Denies Social History:  Social History   Substance and Sexual Activity  Alcohol Use Yes   Comment: daily     Social History   Substance and Sexual Activity  Drug Use Yes   Types: Cocaine, Marijuana, "Crack" cocaine    Social History   Socioeconomic History   Marital status: Legally Separated    Spouse name: Not on file   Number of children: Not on file   Years of education: Not on file   Highest education level: Not on file  Occupational History   Not on file  Tobacco Use   Smoking status: Every Day    Packs/day: 0.50    Types: Cigarettes   Smokeless tobacco: Never  Vaping Use   Vaping Use: Never used  Substance and Sexual Activity   Alcohol use: Yes    Comment: daily   Drug use: Yes    Types: Cocaine, Marijuana, "Crack" cocaine   Sexual activity: Yes    Partners: Male  Other Topics Concern   Not on file  Social  History Narrative   Not on file   Social Determinants of Health   Financial Resource Strain: Not on file  Food Insecurity: Not on file  Transportation Needs: Not on file  Physical Activity: Not on file  Stress: Not on file  Social Connections: Not on file   Additional Social History:    Allergies:   Allergies  Allergen Reactions   Iron Dextran Shortness Of Breath and Anxiety   Aspirin Nausea And Vomiting and Other (See Comments)    Ok to take tylenol or ibuprofen     Labs:  Results for orders placed or performed during the hospital encounter of 04/17/22 (from the past 48 hour(s))  Comprehensive metabolic panel     Status: Abnormal   Collection Time: 04/20/22  3:20 AM  Result Value Ref Range   Sodium 138 135 - 145 mmol/L   Potassium 3.6 3.5 - 5.1 mmol/L   Chloride 99 98 - 111 mmol/L   CO2 31 22 - 32 mmol/L   Glucose, Bld 83 70 - 99 mg/dL    Comment: Glucose reference range applies only to samples taken after fasting for at least 8 hours.   BUN <5 (L) 6 - 20 mg/dL   Creatinine, Ser 0.40 (L) 0.44 - 1.00 mg/dL   Calcium 8.1 (L) 8.9 - 10.3 mg/dL   Total Protein 5.0 (L) 6.5 - 8.1 g/dL   Albumin 2.2 (L) 3.5 - 5.0 g/dL   AST 14 (L) 15 - 41 U/L   ALT 11 0 - 44 U/L   Alkaline Phosphatase 81 38 - 126 U/L   Total Bilirubin 0.5 0.3 - 1.2 mg/dL   GFR, Estimated >60 >60 mL/min    Comment: (NOTE) Calculated using the CKD-EPI Creatinine Equation (2021)    Anion gap 8 5 - 15    Comment: Performed at [facility], [place] [address] [place] [address]  CBC     Status: Abnormal   Collection Time: 04/21/22  3:54 AM  Result Value Ref Range   WBC 7.6 4.0 - 10.5 K/uL   RBC 3.94 3.87 - 5.11 MIL/uL   Hemoglobin 11.7 (L) 12.0 - 15.0 g/dL   HCT 35.7 (L) 36.0 - 46.0 %   MCV 90.6 80.0 - 100.0 fL  MCH 29.7 26.0 - 34.0 pg   MCHC 32.8 30.0 - 36.0 g/dL   RDW 13.4 11.5 - 15.5 %   Platelets 252 150 - 400 K/uL   nRBC 0.0 0.0 - 0.2 %    Comment: Performed at [facility], [place] [address] [place], [place] [address]    Current Facility-Administered Medications  Medication Dose Route Frequency Provider Last Rate Last Admin   acetaminophen (TYLENOL) tablet 650 mg  650 mg Per Tube Q6H PRN Cornett, Marcello Moores, MD       Or   acetaminophen (TYLENOL) suppository 650 mg  650 mg Rectal Q6H PRN Cornett, Thomas, MD       diphenhydrAMINE (BENADRYL) injection 12.5 mg  12.5 mg Intravenous Q6H PRN Cornett, Thomas, MD       Or   diphenhydrAMINE (BENADRYL) 12.5 MG/5ML elixir 12.5 mg  12.5 mg Oral Q6H PRN Cornett, Thomas, MD       enoxaparin (LOVENOX) injection 40 mg  40 mg Subcutaneous Q24H Darrick Meigs, Gagan S, MD   40 mg at 92/42/68 3419   folic acid 1 mg in sodium chloride 0.9 % 50 mL IVPB  1 mg Intravenous Daily Cornett, Marcello Moores, MD 100.4 mL/hr at 04/21/22 0855 1 mg at 04/21/22 0855   haloperidol (HALDOL) tablet 2 mg  2 mg Oral Q8H PRN Corena Pilgrim, MD       Or   haloperidol lactate (HALDOL) injection 2 mg  2 mg Intramuscular Q8H PRN Akintayo, Mojeed, MD   2 mg at 04/20/22 2142   HYDROmorphone (DILAUDID) 1 mg/mL PCA injection   Intravenous Q4H Cornett, Marcello Moores, MD   Received at 04/21/22 0848   ipratropium-albuterol (DUONEB) 0.5-2.5 (3) MG/3ML nebulizer solution 3 mL  3 mL Nebulization Q6H PRN Orma Flaming, MD       ketorolac (TORADOL) 30 MG/ML injection 15 mg  15 mg Intravenous Q6H Donnie Mesa, MD   15 mg at 04/21/22 0540   lactated ringers infusion   Intravenous Continuous Cornett, Marcello Moores, MD 75 mL/hr at 04/21/22 0852 Infusion Verify at 04/21/22 0852   naloxone (NARCAN) injection 0.4 mg  0.4 mg Intravenous PRN Cornett, Marcello Moores, MD       And   sodium chloride flush (NS) 0.9 % injection 9 mL  9 mL Intravenous PRN Cornett, Thomas, MD       nicotine (NICODERM CQ - dosed in mg/24 hours) patch 21 mg  21 mg Transdermal Daily Cornett, Marcello Moores, MD   21 mg at 04/21/22 0856   ondansetron (ZOFRAN) tablet 4 mg  4 mg Oral Q6H PRN Cornett, Marcello Moores, MD       Or   ondansetron (ZOFRAN) injection  4 mg  4 mg Intravenous Q6H PRN Cornett, Marcello Moores, MD   4 mg at 04/17/22 2329   ondansetron (ZOFRAN) injection 4 mg  4 mg Intravenous Q6H PRN Cornett, Marcello Moores, MD       pantoprazole (PROTONIX) injection 40 mg  40 mg Intravenous Q24H Cornett, Marcello Moores, MD   40 mg at 04/20/22 1616   phenol (CHLORASEPTIC) mouth spray 1 spray  1 spray Mouth/Throat PRN Donnie Mesa, MD       prochlorperazine (COMPAZINE) injection 10 mg  10 mg Intravenous Q6H PRN Cornett, Marcello Moores, MD   10 mg at 04/18/22 0112   thiamine (VITAMIN B1) tablet 100 mg  100 mg Oral Daily Cornett, Thomas, MD       Or   thiamine (VITAMIN B1) injection 100 mg  100 mg Intravenous Daily Erroll Luna, MD  100 mg at 04/21/22 1749    Musculoskeletal: Strength & Muscle Tone: within normal limits Gait & Station: normal Patient leans: N/A  Psychiatric Specialty Exam:  Presentation  General Appearance:  Appropriate for Environment; Casual  Eye Contact: Good  Speech: Clear and Coherent; Normal Rate  Speech Volume: Normal  Handedness: Right   Mood and Affect  Mood: Euthymic  Affect: Congruent; Appropriate   Thought Process  Thought Processes: Coherent; Linear  Descriptions of Associations:Intact  Orientation:Full (Time, Place and Person)  Thought Content:Logical  History of Schizophrenia/Schizoaffective disorder:No data recorded Duration of Psychotic Symptoms:No data recorded Hallucinations:Hallucinations: None  Ideas of Reference:None  Suicidal Thoughts:Suicidal Thoughts: No  Homicidal Thoughts:Homicidal Thoughts: No   Sensorium  Memory: Immediate Good; Recent Good; Remote Good  Judgment: Fair  Insight: Good   Executive Functions  Concentration: Fair  Attention Span: Fair  Recall: [place] of Knowledge: Fair  Language: Fair   Psychomotor Activity  Psychomotor Activity: Psychomotor Activity: Normal   Assets  Assets: Communication Skills; Desire for Improvement; Physical Health;  Resilience; Social Support   Sleep  Sleep: Sleep: Fair   Physical Exam: Physical Exam Vitals and nursing note reviewed.  Constitutional:      General: She is awake.     Appearance: Normal appearance. She is well-developed and normal weight.  HENT:     Head: Normocephalic.  Skin:    General: Skin is warm.     Capillary Refill: Capillary refill takes less than 2 seconds.  Neurological:     General: No focal deficit present.     Mental Status: She is alert and oriented to person, place, and time. Mental status is at baseline.  Psychiatric:        Mood and Affect: Mood normal.        Behavior: Behavior normal. Behavior is cooperative.        Thought Content: Thought content normal.        Judgment: Judgment normal.   Review of Systems  HENT: Negative.    Eyes: Negative.   Respiratory: Negative.    Cardiovascular: Negative.   Genitourinary: Negative.   Musculoskeletal: Negative.   Skin: Negative.   Neurological: Negative.   Endo/Heme/Allergies: Negative.   Psychiatric/Behavioral: Negative.    All other systems reviewed and are negative.  Blood pressure 136/79, pulse 96, temperature 98.1 F (36.7 C), temperature source Oral, resp. rate 18, height '5\' 2"'$  (1.575 m), weight 49.4 kg, last menstrual period 01/08/2011, SpO2 96 %. Body mass index is 19.94 kg/m.  Treatment Plan Summary: Daily contact with patient to assess and evaluate symptoms and progress in treatment, Medication management, and Plan    -Continue delirium precautions, patient on day for hospitalization, increased risk for developing delirium associated with surgery, ICU, and other medical comorbidities. -Discontinue 1:1 safety sitter -Continue CIWAA and Lorazepam detox protocol -Will schedule Haldol '2mg'$  po/IM q8 hr for agitation.  -Will monitor Qtc for prolongation. EKG obtained on 10/26; 442   Disposition: No evidence of imminent risk to self or others at present.   Patient does not meet criteria for  psychiatric inpatient admission. Supportive therapy provided about ongoing stressors. Discussed crisis plan, support from social network, calling 911, coming to the Emergency Department, and calling Suicide Hotline.  Suella Broad, FNP 04/21/2022 11:31 AM

## 2022-04-21 NOTE — Progress Notes (Signed)
[facility]  PROGRESS NOTE  CHANEKA TREFZ JKK:[id number] DOB: 1965/10/11 DOA: 04/17/2022 PCP: [name], MD   Brief HPI:    56 y.o. female with medical history significant of  Schizo affective d/o , COPD, Anemia, GERD, HTN, gastric bypass in 2017 by Dr. Hillary Bow recent interim history of  seizure s/p fall diagnosed 8/23.  In addition to history of  admission 7/23-8/23 for suicide attempt complicated by aspiration pneumonia with acute respiratory failure that was vent dependent. Had trach placed during this stay.  During this admission patient also developed colonic ileus and was seen by GI and placed on Linzess and lactulose.She was recently admitted on on 10/13-10/21 for SBO and was treated conservatively with small bowel protocol. She had persistent obstruction and left AMA on 10/21.  Patient presented to ED with complaints of abdominal pain.  CT abdomen/pelvis showed diffuse small bowel distention with mesenteric edema in the lower abdomen pelvis with cavitary interloop mesenteric fluid. Small bowel loop in the left pelvis shows fecalized enteric contents and is distended up to 3.9 cm diameter. Multiple nondilated loops in the right abdomen show apparent mural edema/thickening. As before, assessment is limited by lack of intra-abdominal fat. Imaging features could be related to ileus although distal small bowel obstruction cannot be excluded. No evidence for pneumatosis in the small bowel or colon. No intraperitoneal free air. No evidence for intraperitoneal abscess. Small volume free fluid in the pelvis. S/p gastric bypass. Stable T9 compression deformity.   General surgery was consulted  Subjective   Patient seen and examined, NG tube clamped.  Patient started on clear liquid diet.  Appreciate psych recommendations.   Assessment/Plan:    Small bowel obstruction/sp laparotomy/lysis of adhesions -General surgery following, NG tube in place -continue dilaudid  PCA  for pain management  Hypokalemia -replete  Polysubstance abuse ([place]) +cocaine and alcohol abuse -UDS pending -CIWA protocol with IV thiamine and IV folic acid while NPO    Schizoaffective disorder, bipolar type ([place]) History of suicide attempt in 12/2021 Has not been taking medication per family No SI/HI.  -Patient has been getting agitated and wanted to leave AMA this morning -Patient is currently n.p.o., will not be good to give her oral psych meds from home -Psychiatry was consulted, patient started on Haldol 2 mg p.o./IM every 8 hours as needed for agitation; recommend to continue with sitter one-to-one for safety    Type 2 diabetes mellitus (HCC) A1C of 6.5 this month Hold metformin Sensitive SSI and accuchecks qac/hs while NPO  -CBG well controlled   Essential hypertension PRN IV labetalol until no longer NPO  Has not been compliant with medication    COPD (chronic obstructive pulmonary disease) (HCC) No s/s of exacerbation Continue SABA prn and symbicort    Seizure ([place]) Seizure precautions On no medication,  ? Alcohol withdrawal  Not on vimpat and has not been on this. Verified with family    GERD (gastroesophageal reflux disease) Continue protonix>IV   Memory loss daughter states that since her OD this summer she has had worsening memory decline She plans on outpatient neurology work up       Medications     enoxaparin (LOVENOX) injection  40 mg Subcutaneous Q24H   HYDROmorphone   Intravenous Q4H   ketorolac  15 mg Intravenous Q6H   nicotine  21 mg Transdermal Daily   pantoprazole (PROTONIX) IV  40 mg Intravenous Q24H   thiamine  100 mg Oral Daily   Or   thiamine  100  mg Intravenous Daily     Data Reviewed:   CBG:  Recent Labs  Lab 04/18/22 1402  GLUCAP 151*    SpO2: 96 % O2 Flow Rate (L/min): 2 L/min FiO2 (%): 21 %    Vitals:   04/20/22 2335 04/21/22 0356 04/21/22 0359 04/21/22 0848  BP:  (!) 149/82  136/79  Pulse:  94  96   Resp: '18 18 19 18  '$ Temp:  98 F (36.7 C)  98.1 F (36.7 C)  TempSrc:  Oral  Oral  SpO2: 95% 98% 97% 96%  Weight:      Height:          Data Reviewed:  Basic Metabolic Panel: Recent Labs  Lab 04/15/22 1511 04/17/22 1018 04/18/22 0052 04/19/22 0324 04/20/22 0320  NA 134* 135 136 136 138  K 3.9 3.0* 3.4* 4.2 3.6  CL 101 102 102 96* 99  CO2 '24 25 27 '$ 32 31  GLUCOSE 92 94 86 133* 83  BUN <5* <5* <5* <5* <5*  CREATININE 0.52 0.49 0.37* 0.62 0.40*  CALCIUM 8.3* 7.9* 8.1* 8.2* 8.1*  MG  --  1.7  --   --   --     CBC: Recent Labs  Lab 04/15/22 1511 04/17/22 1018 04/18/22 0052 04/19/22 0324 04/21/22 0354  WBC 8.3 7.3 9.7 16.5* 7.6  NEUTROABS 5.2 4.0  --   --   --   HGB 14.3 12.9 13.0 14.7 11.7*  HCT 42.1 38.1 39.7 44.2 35.7*  MCV 88.8 88.6 88.6 89.7 90.6  PLT 369 352 348 421* 252    LFT Recent Labs  Lab 04/15/22 1511 04/17/22 1018 04/19/22 0324 04/20/22 0320  AST '20 16 19 '$ 14*  ALT '15 13 11 11  '$ ALKPHOS 102 88 104 81  BILITOT 0.7 0.4 0.8 0.5  PROT 6.7 5.8* 6.3* 5.0*  ALBUMIN 3.1* 2.7* 2.6* 2.2*     Antibiotics: Anti-infectives (From admission, onward)    Start     Dose/Rate Route Frequency Ordered Stop   04/18/22 1100  cefoTEtan (CEFOTAN) 2 g in sodium chloride 0.9 % 100 mL IVPB        2 g 200 mL/hr over 30 Minutes Intravenous On call to O.R. 04/18/22 1053 04/19/22 0840   04/18/22 1059  sodium chloride 0.9 % with cefoTEtan (CEFOTAN) ADS Med       Note to Pharmacy: [place] Mo E: cabinet override      04/18/22 1059 04/18/22 1243        DVT prophylaxis: SCDs  Code Status: Full code  Family Communication: Discussed with daughter on phone   CONSULTS General surgery   Objective    Physical Examination:  General-appears in no acute distress Heart-S1-S2, regular, no murmur auscultated Lungs-clear to auscultation bilaterally, no wheezing or crackles auscultated Abdomen-soft, nontender, no organomegaly Extremities-no edema in the lower  extremities Neuro-alert, oriented x3, no focal deficit noted   Status is: Inpatient:             Oswald Hillock   [facility] If 7PM-7AM, please contact night-coverage at [email or web address], Office  [phone]   04/21/2022, 10:52 AM  LOS: 4 days

## 2022-04-21 NOTE — Progress Notes (Signed)
3 Days Post-Op   Subjective/Chief Complaint: Daughter is bedside and says patient is much more herself today. No agitation this am and alert and oriented. Passing flatus but no BM. Abdominal pain well controlled  Objective: Vital signs in last 24 hours: Temp:  [97.8 F (36.6 C)-98.2 F (36.8 C)] 98.1 F (36.7 C) (10/30 0848) Pulse Rate:  [88-98] 96 (10/30 0848) Resp:  [15-20] 18 (10/30 0848) BP: (121-154)/(66-97) 136/79 (10/30 0848) SpO2:  [92 %-98 %] 96 % (10/30 0848) FiO2 (%):  [21 %] 21 % (10/30 0359) Last BM Date :  (PTA)  Intake/Output from previous day: 10/29 0701 - 10/30 0700 In: 2200.8 [P.O.:570; I.V.:1580.8; IV Piggyback:50] Out: 4825 [Urine:1550] Intake/Output this shift: Total I/O In: 214.9 [I.V.:214.9] Out: 525 [Urine:500; Emesis/NG output:25]  PE: General: pleasant, WD, female who is laying in bed in NAD HEENT: Mouth is pink and moist Heart: regular, rate, and rhythm. Lungs: Respiratory effort nonlabored Abd: soft, ND, +BS, mild appropriate TTP around midline incision which has honeycomb dressing in place with staples visible intact with dried blood. NGT with thin clear output MSK: all 4 extremities are symmetrical with no cyanosis, clubbing, or edema. Skin: warm and dry Psych: A&Ox3 with an appropriate affect. Posey belt in place   Lab Results:  Recent Labs    04/19/22 0324 04/21/22 0354  WBC 16.5* 7.6  HGB 14.7 11.7*  HCT 44.2 35.7*  PLT 421* 252    BMET Recent Labs    04/19/22 0324 04/20/22 0320  NA 136 138  K 4.2 3.6  CL 96* 99  CO2 32 31  GLUCOSE 133* 83  BUN <5* <5*  CREATININE 0.62 0.40*  CALCIUM 8.2* 8.1*    PT/INR No results for input(s): "LABPROT", "INR" in the last 72 hours.  ABG No results for input(s): "PHART", "HCO3" in the last 72 hours.  Invalid input(s): "PCO2", "PO2"  Studies/Results: No results found.  Anti-infectives: Anti-infectives (From admission, onward)    Start     Dose/Rate Route Frequency Ordered  Stop   04/18/22 1100  cefoTEtan (CEFOTAN) 2 g in sodium chloride 0.9 % 100 mL IVPB        2 g 200 mL/hr over 30 Minutes Intravenous On call to O.R. 04/18/22 1053 04/19/22 0840   04/18/22 1059  sodium chloride 0.9 % with cefoTEtan (CEFOTAN) ADS Med       Note to Pharmacy: Harden Mo E: cabinet override      04/18/22 1059 04/18/22 1243       Assessment/Plan: SBO H/o gastric bypass 2017 Dr. Excell Seltzer, history of laparoscopic hysterectomy - CT 10/13 w/ SBO and TP poster pelvis. Mild twisting of mesentery within RLQ. Small HH -Admitted to hospitalist service 10/13 through 10/20 for SBO managed conservatively.  NG tube removed during admission but obstruction did not completely resolve.  She left AMA 10/20 after recommendation for continued n.p.o. status and NG tube replacement -CT 10/24 and 10/26 done without oral contrast showing diffuse small bowel distention concerning for ileus versus SBO however favor pSBO given history Exploratory laparotomy/ small bowel resection/ lysis of adhesions 04/18/22 - Dr. Brantley Stage 10/27 - Findings of Small bowel stricture POD 3 Passing flatus and NGT output minimal. Clamp NGT and trial clears Pain control - PCA, Toradol Chloraseptic spray Ambulate as tolerated   FEN: clamp NGT, CLD, IVF per primary ID: none VTE: lovenox    Alcohol use Tobacco use DM GERD COPD Afib  HTN  LOS: 4 days   [name], [facility]  Surgery 04/21/2022, 9:53 AM Please see Amion for pager number during day hours 7:00am-4:30pm

## 2022-04-22 DIAGNOSIS — I10 Essential (primary) hypertension: Secondary | ICD-10-CM | POA: Diagnosis not present

## 2022-04-22 DIAGNOSIS — J449 Chronic obstructive pulmonary disease, unspecified: Secondary | ICD-10-CM | POA: Diagnosis not present

## 2022-04-22 DIAGNOSIS — K56609 Unspecified intestinal obstruction, unspecified as to partial versus complete obstruction: Secondary | ICD-10-CM | POA: Diagnosis not present

## 2022-04-22 DIAGNOSIS — R109 Unspecified abdominal pain: Secondary | ICD-10-CM | POA: Diagnosis not present

## 2022-04-22 DIAGNOSIS — K219 Gastro-esophageal reflux disease without esophagitis: Secondary | ICD-10-CM

## 2022-04-22 LAB — GLUCOSE, CAPILLARY
Glucose-Capillary: 211 mg/dL — ABNORMAL HIGH (ref 70–99)
Glucose-Capillary: 257 mg/dL — ABNORMAL HIGH (ref 70–99)

## 2022-04-22 MED ORDER — INSULIN ASPART 100 UNIT/ML IJ SOLN
0.0000 [IU] | Freq: Three times a day (TID) | INTRAMUSCULAR | Status: DC
  Administered 2022-04-22: 3 [IU] via SUBCUTANEOUS
  Administered 2022-04-23: 2 [IU] via SUBCUTANEOUS
  Administered 2022-04-23: 3 [IU] via SUBCUTANEOUS
  Administered 2022-04-23: 1 [IU] via SUBCUTANEOUS
  Administered 2022-04-24 – 2022-04-25 (×3): 2 [IU] via SUBCUTANEOUS
  Administered 2022-04-25: 1 [IU] via SUBCUTANEOUS
  Administered 2022-04-26 – 2022-04-27 (×2): 2 [IU] via SUBCUTANEOUS
  Administered 2022-04-27: 1 [IU] via SUBCUTANEOUS

## 2022-04-22 MED ORDER — HYDROMORPHONE HCL 1 MG/ML IJ SOLN
0.5000 mg | INTRAMUSCULAR | Status: DC | PRN
  Administered 2022-04-22: 0.5 mg via INTRAVENOUS
  Administered 2022-04-23 – 2022-04-24 (×3): 1 mg via INTRAVENOUS
  Filled 2022-04-22 (×3): qty 1

## 2022-04-22 MED ORDER — ACETAMINOPHEN 650 MG RE SUPP: 650.0000 mg | Freq: Four times a day (QID) | RECTAL | Status: DC | PRN

## 2022-04-22 MED ORDER — OXYCODONE HCL 5 MG PO TABS
5.0000 mg | ORAL_TABLET | ORAL | Status: DC | PRN
  Administered 2022-04-23 – 2022-04-26 (×15): 10 mg via ORAL
  Administered 2022-04-27: 5 mg via ORAL
  Administered 2022-04-27 – 2022-04-29 (×10): 10 mg via ORAL
  Filled 2022-04-22 (×26): qty 2

## 2022-04-22 MED ORDER — ACETAMINOPHEN 325 MG PO TABS: 650.0000 mg | ORAL_TABLET | Freq: Four times a day (QID) | ORAL | Status: DC | PRN

## 2022-04-22 NOTE — Progress Notes (Addendum)
[facility]  PROGRESS NOTE  [name] KNL:[id number] DOB: [birth year] DOA: 04/17/2022 PCP: [name], MD   Brief HPI:    56 y.o. female with medical history significant of  Schizo affective d/o , COPD, Anemia, GERD, HTN, gastric bypass in 2017 by Dr. Hillary Bow recent interim history of  seizure s/p fall diagnosed 8/23.  In addition to history of  admission 7/23-8/23 for suicide attempt complicated by aspiration pneumonia with acute respiratory failure that was vent dependent. Had trach placed during this stay.  During this admission patient also developed colonic ileus and was seen by GI and placed on Linzess and lactulose.She was recently admitted on on 10/13-10/21 for SBO and was treated conservatively with small bowel protocol. She had persistent obstruction and left AMA on 10/21.  Patient presented to ED with complaints of abdominal pain.  CT abdomen/pelvis showed diffuse small bowel distention with mesenteric edema in the lower abdomen pelvis with cavitary interloop mesenteric fluid. Small bowel loop in the left pelvis shows fecalized enteric contents and is distended up to 3.9 cm diameter. Multiple nondilated loops in the right abdomen show apparent mural edema/thickening. As before, assessment is limited by lack of intra-abdominal fat. Imaging features could be related to ileus although distal small bowel obstruction cannot be excluded. No evidence for pneumatosis in the small bowel or colon. No intraperitoneal free air. No evidence for intraperitoneal abscess. Small volume free fluid in the pelvis. S/p gastric bypass. Stable T9 compression deformity.   General surgery was consulted  Subjective   Patient seen, NG tube was clamped and patient started on clear liquid diet.   Assessment/Plan:    Small bowel obstruction/sp laparotomy/lysis of adhesions -General surgery following, NG tube in place -continue dilaudid  PCA for pain  management  Hypokalemia -replete  Polysubstance abuse ([place]) +cocaine and alcohol abuse -UDS pending -CIWA protocol with IV thiamine and IV folic acid while NPO    Schizoaffective disorder, bipolar type ([place]) History of suicide attempt in 12/2021 Has not been taking medication per family No SI/HI.  -Patient has been getting agitated and wanted to leave AMA this morning -Psychiatry was consulted, patient started on Haldol 2 mg p.o./IM every 8 hours as needed for agitation; recommend to continue with sitter one-to-one for safety -Patient says that if she wants to leave AMA ;she may not be making the right decision and is okay to IVC her    Type 2 diabetes mellitus (HCC) A1C of 6.5 this month Hold metformin Sensitive SSI and accuchecks qac/hs while NPO  -CBG well controlled   Essential hypertension PRN IV labetalol until no longer NPO  Has not been compliant with medication    COPD (chronic obstructive pulmonary disease) (HCC) No s/s of exacerbation Continue SABA prn and symbicort    Seizure ([place]) Seizure precautions On no medication,  ? Alcohol withdrawal  Not on vimpat and has not been on this. Verified with family    GERD (gastroesophageal reflux disease) Continue protonix>IV   Memory loss daughter states that since her OD this summer she has had worsening memory decline She plans on outpatient neurology work up       Medications     enoxaparin (LOVENOX) injection  40 mg Subcutaneous Q24H   haloperidol  2 mg Oral TID   Or   haloperidol lactate  2 mg Intramuscular TID   HYDROmorphone   Intravenous Q4H   ketorolac  15 mg Intravenous Q6H   nicotine  21 mg Transdermal Daily   pantoprazole (PROTONIX)  IV  40 mg Intravenous Q24H   thiamine  100 mg Oral Daily   Or   thiamine  100 mg Intravenous Daily     Data Reviewed:   CBG:  Recent Labs  Lab 04/18/22 1402  GLUCAP 151*    SpO2: 94 % O2 Flow Rate (L/min): 2 L/min FiO2 (%): 21 %    Vitals:    04/22/22 0136 04/22/22 0535 04/22/22 0759 04/22/22 0803  BP: (!) 157/86 129/88    Pulse: 89 92 98   Resp: 19 17  (!) 21  Temp:   97.6 F (36.4 C)   TempSrc:   Oral   SpO2: 98% 96% 93% 94%  Weight:      Height:          Data Reviewed:  Basic Metabolic Panel: Recent Labs  Lab 04/15/22 1511 04/17/22 1018 04/18/22 0052 04/19/22 0324 04/20/22 0320  NA 134* 135 136 136 138  K 3.9 3.0* 3.4* 4.2 3.6  CL 101 102 102 96* 99  CO2 '24 25 27 '$ 32 31  GLUCOSE 92 94 86 133* 83  BUN <5* <5* <5* <5* <5*  CREATININE 0.52 0.49 0.37* 0.62 0.40*  CALCIUM 8.3* 7.9* 8.1* 8.2* 8.1*  MG  --  1.7  --   --   --     CBC: Recent Labs  Lab 04/15/22 1511 04/17/22 1018 04/18/22 0052 04/19/22 0324 04/21/22 0354  WBC 8.3 7.3 9.7 16.5* 7.6  NEUTROABS 5.2 4.0  --   --   --   HGB 14.3 12.9 13.0 14.7 11.7*  HCT 42.1 38.1 39.7 44.2 35.7*  MCV 88.8 88.6 88.6 89.7 90.6  PLT 369 352 348 421* 252    LFT Recent Labs  Lab 04/15/22 1511 04/17/22 1018 04/19/22 0324 04/20/22 0320  AST '20 16 19 '$ 14*  ALT '15 13 11 11  '$ ALKPHOS 102 88 104 81  BILITOT 0.7 0.4 0.8 0.5  PROT 6.7 5.8* 6.3* 5.0*  ALBUMIN 3.1* 2.7* 2.6* 2.2*     Antibiotics: Anti-infectives (From admission, onward)    Start     Dose/Rate Route Frequency Ordered Stop   04/18/22 1100  cefoTEtan (CEFOTAN) 2 g in sodium chloride 0.9 % 100 mL IVPB        2 g 200 mL/hr over 30 Minutes Intravenous On call to O.R. 04/18/22 1053 04/19/22 0840   04/18/22 1059  sodium chloride 0.9 % with cefoTEtan (CEFOTAN) ADS Med       Note to Pharmacy: Harden Mo E: cabinet override      04/18/22 1059 04/18/22 1243        DVT prophylaxis: SCDs  Code Status: Full code  Family Communication: Discussed with daughter on phone   CONSULTS General surgery   Objective    Physical Examination:  General-appears in no acute distress Heart-S1-S2, regular, no murmur auscultated Lungs-clear to auscultation bilaterally, no wheezing or crackles  auscultated Abdomen-soft, nontender, no organomegaly Extremities-no edema in the lower extremities Neuro-alert, oriented x3, no focal deficit noted   Status is: Inpatient:             Oswald Hillock   [facility] If 7PM-7AM, please contact night-coverage at [email or web address], Office  [phone]   04/22/2022, 2:44 PM  LOS: 5 days

## 2022-04-22 NOTE — Progress Notes (Addendum)
4 Days Post-Op   Subjective/Chief Complaint: Alert and oriented. States she had a bowel movement overnight and has been passing flatus. NGT has remained clamped and she finished her clear liquids tray this morning. She says abdominal pain is better than yesterday   Objective: Vital signs in last 24 hours: Temp:  [97.6 F (36.4 C)-98.9 F (37.2 C)] 97.6 F (36.4 C) (10/31 0759) Pulse Rate:  [89-105] 98 (10/31 0759) Resp:  [15-22] 21 (10/31 0803) BP: (129-157)/(83-93) 129/88 (10/31 0535) SpO2:  [91 %-98 %] 94 % (10/31 0803) FiO2 (%):  [21 %] 21 % (10/31 0535) Last BM Date :  (PTA)  Intake/Output from previous day: 10/30 0701 - 10/31 0700 In: 1796.9 [I.V.:1746.9; IV Piggyback:50] Out: 2525 [Urine:2500; Emesis/NG output:25] Intake/Output this shift: No intake/output data recorded.  PE: General: pleasant, WD, female who is laying in bed in NAD HEENT: Mouth is pink and moist Heart: regular, rate, and rhythm. Lungs: Respiratory effort nonlabored on supplemental O2 Abd: soft, ND, +BS, mild appropriate TTP around midline incision stable from yesterday. incision has honeycomb dressing in place with staples visible intact with dried blood. NGT clamped Skin: warm and dry Psych: A&Ox3 with an appropriate affect. Posey belt in place   Lab Results:  Recent Labs    04/21/22 0354  WBC 7.6  HGB 11.7*  HCT 35.7*  PLT 252    BMET Recent Labs    04/20/22 0320  NA 138  K 3.6  CL 99  CO2 31  GLUCOSE 83  BUN <5*  CREATININE 0.40*  CALCIUM 8.1*    PT/INR No results for input(s): "LABPROT", "INR" in the last 72 hours.  ABG No results for input(s): "PHART", "HCO3" in the last 72 hours.  Invalid input(s): "PCO2", "PO2"  Studies/Results: No results found.  Anti-infectives: Anti-infectives (From admission, onward)    Start     Dose/Rate Route Frequency Ordered Stop   04/18/22 1100  cefoTEtan (CEFOTAN) 2 g in sodium chloride 0.9 % 100 mL IVPB        2 g 200 mL/hr over 30  Minutes Intravenous On call to O.R. 04/18/22 1053 04/19/22 0840   04/18/22 1059  sodium chloride 0.9 % with cefoTEtan (CEFOTAN) ADS Med       Note to Pharmacy: Harden Mo E: cabinet override      04/18/22 1059 04/18/22 1243       Assessment/Plan: SBO H/o gastric bypass 2017 Dr. Excell Seltzer, history of laparoscopic hysterectomy - CT 10/13 w/ SBO and TP poster pelvis. Mild twisting of mesentery within RLQ. Small HH -Admitted to hospitalist service 10/13 through 10/20 for SBO managed conservatively.  NG tube removed during admission but obstruction did not completely resolve.  She left AMA 10/20 after recommendation for continued n.p.o. status and NG tube replacement -CT 10/24 and 10/26 done without oral contrast showing diffuse small bowel distention concerning for ileus versus SBO however favor pSBO given history POD 4 s/p exploratory laparotomy/ small bowel resection/ lysis of adhesions 04/18/22 - Dr. Brantley Stage 10/27 - Findings of Small bowel stricture -Surgical path without IBD or neoplasm. Fibrous adhesion and benign mesothelial cell proliferation - passing flatus and reports BM (not charted and discussed with RN that there was no sign out from night rn confirming BM). Has tolerated clamping and clears - dc NGT and advance to fulls. Monitor closely for n/v/abd pain - will dc PCA. Continue toradol. Add PO pain meds - Chloraseptic spray - Ambulate as tolerated - check cbc and bmp in am  FEN: FLD, IVF per primary ID: none VTE: lovenox    Alcohol use Tobacco use DM GERD COPD Afib  HTN   LOS: 5 days   Winferd [name], [facility] 04/22/2022, 10:08 AM Please see Amion for pager number during day hours 7:00am-4:30pm

## 2022-04-22 NOTE — Progress Notes (Signed)
Mobility Specialist Progress Note   04/22/22 1500  Mobility  Activity Ambulated with assistance in hallway  Level of Assistance Contact guard assist, steadying assist  Assistive Device Front wheel walker  Distance Ambulated (ft) 480 ft  Range of Motion/Exercises Active;All extremities  Activity Response Tolerated well   Patient received in supine eager to participate in mobility. Ambulated min guard with slow steady gait. Returned to room without complaint or incident. Was left in supine with all needs met, call bell in reach.   [place] Kanylah Muench, [place], [place]  Office: [phone]

## 2022-04-22 NOTE — Consult Note (Signed)
[facility] Face-to-Face Psychiatry Consult   Reason for Consult: Agitation Referring Physician: Dr. Darrick Meigs Patient Identification: [name] MRN:  [id number] Principal Diagnosis: Small bowel obstruction ([place]) Diagnosis:  Principal Problem:   Small bowel obstruction ([place]) Active Problems:   COPD (chronic obstructive pulmonary disease) ([place])   Hypokalemia   Essential hypertension   Type 2 diabetes mellitus (HCC)   Schizoaffective disorder, bipolar type ([place])   Polysubstance abuse ([place])   GERD (gastroesophageal reflux disease)   Seizure ([place])   Memory loss   Total Time spent with patient: 1 hour  Subjective:   [name] is a 56 y.o. female patient admitted with small bowel obstruction.  Today upon evaluation patient is appropriate and alert. She appears to be engaging well with staff, family and Probation officer.  On today's reassessment patient did not present with any focal neurological deficit, she was able to speak clearly, follow commands, and denies weakness.   At this current time, patient's pre-existing condition that was substantially aggravated during this admission has returned to previous level.  Patient further states that her current health at this present time seems to be normal as she is noted to feel better and like her self again.  She does continue to endorse some mental fog, overall assessing is doing better.  She does not present with any decrease in attention or concentration, she is alert and oriented x 4, shows no short-term memory deficit.  She is not lethargic on today's exam, shows linear thought processes, and answers all questions appropriately.  At this time it is felt that patient has returned to psychiatric baseline, and will be stable to follow up with outpatient services. She denies suicidal ideation, homicidal ideation, and or auditory or visual hallucinations   HPI:  Patient is a 56 y.o. female with history of Schizoaffective d/o , COPD, Anemia, GERD, HTN, gastric  bypass in 2017 by Dr. Hillary Bow recent interim history of  seizure s/p fall diagnosed 8/23. Also, patient was admitted to the hospital from 7/23 to 8/23 for suicide attempt complicated by aspiration pneumonia with acute respiratory failure that was vent dependent. Had trach placed during this stay, developed colonic ileus and was seen by GI and placed on Linzess and lactulose.She was recently admitted on on 10/13-10/21 for SBO and was treated conservatively with small bowel protocol. She had persistent obstruction and left AMA on 10/21 and recently admitted to the hospital due to abdominal pain. Today, patient is unable to participate in psychiatric evaluation. However, staff reports that she has been getting agitated when she wakes up from sleeping. Medication review reveales that patient is on pain medication and Ativan detox protocol.  Past Psychiatric History: Polysubstance abuse, Depression, Schizoaffective;Bipolar type,  Previous dx's of schizophrenia, bipolar - Multiple inpatient stays (5) [facility], [facility]; last inpatient admission [address] in 01/2022 after suicide attempt - Follows up at [facility], previously had ACT team - hx of suicide attempt x2 via overdose as a teen, 12/2021 by OD  Risk to Self:  None at this time Risk to Others:  Denies Prior Inpatient Therapy:  See above Prior Outpatient Therapy:   History of [facility],ACT team.   Past Medical History:  Past Medical History:  Diagnosis Date   Abdominal pain    Accidental drug overdose April 2013   Anxiety    Atrial fibrillation ([place]) 09/29/11   converted spontaneously   Chronic back pain    Chronic knee pain    Chronic nausea  Chronic pain    COPD (chronic obstructive pulmonary disease) (HCC)    Depression    Diabetes mellitus    states her doctor took her off all DM meds in past month   Diabetic neuropathy ([place])    Dyspnea    with exertion    GERD (gastroesophageal reflux disease)     Headache(784.0)    migraines    HTN (hypertension)    not on meds since in a year    Hyperlipidemia    Hypothyroidism    not on meds in a while    Mental disorder    Bipolar and schizophrenic   Requires supplemental oxygen    as needed per patient    Schizophrenia ([place])    Schizophrenia, acute ([place]) 11/13/2017   Tobacco abuse     Past Surgical History:  Procedure Laterality Date   ABDOMINAL HYSTERECTOMY     BLADDER SUSPENSION  03/04/2011   Procedure: [facility] PROCEDURE;  Surgeon: Elayne Snare MacDiarmid;  Location: [facility];  Service: Urology;  Laterality: N/A;   BOWEL RESECTION N/A 04/18/2022   Procedure: SMALL BOWEL RESECTION;  Surgeon: Erroll Luna, MD;  Location: [place];  Service: General;  Laterality: N/A;   CYSTOCELE REPAIR  03/04/2011   Procedure: ANTERIOR REPAIR (CYSTOCELE);  Surgeon: Reece Packer;  Location: [facility];  Service: Urology;  Laterality: N/A;   CYSTOSCOPY  03/04/2011   Procedure: CYSTOSCOPY;  Surgeon: Elayne Snare MacDiarmid;  Location: [facility];  Service: Urology;  Laterality: N/A;   ESOPHAGOGASTRODUODENOSCOPY (EGD) WITH PROPOFOL N/A 05/12/2017   Procedure: ESOPHAGOGASTRODUODENOSCOPY (EGD) WITH PROPOFOL;  Surgeon: Alphonsa Overall, MD;  Location: [facility];  Service: General;  Laterality: N/A;   GASTRIC ROUX-EN-Y N/A 03/25/2016   Procedure: LAPAROSCOPIC ROUX-EN-Y GASTRIC BYPASS WITH UPPER ENDOSCOPY;  Surgeon: Excell Seltzer, MD;  Location: [facility];  Service: General;  Laterality: N/A;   KNEE SURGERY     LAPAROSCOPIC ASSISTED VAGINAL HYSTERECTOMY  03/04/2011   Procedure: LAPAROSCOPIC ASSISTED VAGINAL HYSTERECTOMY;  Surgeon: Cyril Mourning, MD;  Location: [facility];  Service: Gynecology;  Laterality: N/A;   LAPAROTOMY N/A 04/18/2022   Procedure: EXPLORATORY LAPAROTOMY;  Surgeon: Erroll Luna, MD;  Location: [facility];  Service: General;  Laterality: N/A;   Family History:  Family History  Problem Relation Age of Onset   Heart attack Father        55s   Diabetes Mother     Heart disease Mother    Hypertension Mother    Heart attack Sister        23   COPD Other    Breast cancer Neg Hx    Family Psychiatric  History: Denies Social History:  Social History   Substance and Sexual Activity  Alcohol Use Yes   Comment: daily     Social History   Substance and Sexual Activity  Drug Use Yes   Types: Cocaine, Marijuana, "Crack" cocaine    Social History   Socioeconomic History   Marital status: Legally Separated    Spouse name: Not on file   Number of children: Not on file   Years of education: Not on file   Highest education level: Not on file  Occupational History   Not on file  Tobacco Use   Smoking status: Every Day    Packs/day: 0.50    Types: Cigarettes   Smokeless tobacco: Never  Vaping Use   Vaping Use: Never used  Substance and Sexual Activity   Alcohol use: Yes    Comment: daily  Drug use: Yes    Types: Cocaine, Marijuana, "Crack" cocaine   Sexual activity: Yes    Partners: Male  Other Topics Concern   Not on file  Social History Narrative   Not on file   Social Determinants of Health   Financial Resource Strain: Not on file  Food Insecurity: Not on file  Transportation Needs: Not on file  Physical Activity: Not on file  Stress: Not on file  Social Connections: Not on file   Additional Social History:    Allergies:   Allergies  Allergen Reactions   Iron Dextran Shortness Of Breath and Anxiety   Aspirin Nausea And Vomiting and Other (See Comments)    Ok to take tylenol or ibuprofen     Labs:  Results for orders placed or performed during the hospital encounter of 04/17/22 (from the past 48 hour(s))  CBC     Status: Abnormal   Collection Time: 04/21/22  3:54 AM  Result Value Ref Range   WBC 7.6 4.0 - 10.5 K/uL   RBC 3.94 3.87 - 5.11 MIL/uL   Hemoglobin 11.7 (L) 12.0 - 15.0 g/dL   HCT 35.7 (L) 36.0 - 46.0 %   MCV 90.6 80.0 - 100.0 fL   MCH 29.7 26.0 - 34.0 pg   MCHC 32.8 30.0 - 36.0 g/dL   RDW 13.4 11.5  - 15.5 %   Platelets 252 150 - 400 K/uL   nRBC 0.0 0.0 - 0.2 %    Comment: Performed at [facility], [place] [address] [place], [place] [address]    Current Facility-Administered Medications  Medication Dose Route Frequency Provider Last Rate Last Admin   acetaminophen (TYLENOL) tablet 650 mg  650 mg Oral Q6H PRN Winferd [name], PA-C       Or   acetaminophen (TYLENOL) suppository 650 mg  650 mg Rectal Q6H PRN Winferd [name], PA-C       diphenhydrAMINE (BENADRYL) injection 12.5 mg  12.5 mg Intravenous Q6H PRN Cornett, Marcello Moores, MD       Or   diphenhydrAMINE (BENADRYL) 12.5 MG/5ML elixir 12.5 mg  12.5 mg Oral Q6H PRN Cornett, Marcello Moores, MD       enoxaparin (LOVENOX) injection 40 mg  40 mg Subcutaneous Q24H Darrick Meigs, Gagan S, MD   40 mg at 38/46/65 9935   folic acid 1 mg in sodium chloride 0.9 % 50 mL IVPB  1 mg Intravenous Daily Cornett, Thomas, MD 100.4 mL/hr at 04/22/22 1048 1 mg at 04/22/22 1048   haloperidol (HALDOL) tablet 2 mg  2 mg Oral TID Suella Broad, FNP   2 mg at 04/21/22 1719   Or   haloperidol lactate (HALDOL) injection 2 mg  2 mg Intramuscular TID Suella Broad, FNP   2 mg at 04/22/22 0900   HYDROmorphone (DILAUDID) 1 mg/mL PCA injection   Intravenous Q4H Cornett, Marcello Moores, MD   Received at 04/22/22 1345   HYDROmorphone (DILAUDID) injection 0.5-1 mg  0.5-1 mg Intravenous Q4H PRN Winferd [name], PA-C       ipratropium-albuterol (DUONEB) 0.5-2.5 (3) MG/3ML nebulizer solution 3 mL  3 mL Nebulization Q6H PRN Orma Flaming, MD       ketorolac (TORADOL) 30 MG/ML injection 15 mg  15 mg Intravenous Q6H Donnie Mesa, MD   15 mg at 04/22/22 1129   lactated ringers infusion   Intravenous Continuous Cornett, Thomas, MD 75 mL/hr at 04/22/22 0600 Infusion Verify at 04/22/22 0600   naloxone (NARCAN) injection 0.4 mg  0.4 mg Intravenous PRN Cornett, Thomas, MD       And   sodium chloride flush (NS) 0.9 % injection 9 mL  9 mL Intravenous PRN Cornett, Thomas, MD        nicotine (NICODERM CQ - dosed in mg/24 hours) patch 21 mg  21 mg Transdermal Daily Cornett, Thomas, MD   21 mg at 04/22/22 0809   ondansetron (ZOFRAN) tablet 4 mg  4 mg Oral Q6H PRN Cornett, Marcello Moores, MD       Or   ondansetron (ZOFRAN) injection 4 mg  4 mg Intravenous Q6H PRN Cornett, Marcello Moores, MD   4 mg at 04/17/22 2329   ondansetron (ZOFRAN) injection 4 mg  4 mg Intravenous Q6H PRN Cornett, Marcello Moores, MD       oxyCODONE (Oxy IR/ROXICODONE) immediate release tablet 5-10 mg  5-10 mg Oral Q4H PRN Winferd [name], PA-C       pantoprazole (PROTONIX) injection 40 mg  40 mg Intravenous Q24H Cornett, Marcello Moores, MD   40 mg at 04/21/22 1718   phenol (CHLORASEPTIC) mouth spray 1 spray  1 spray Mouth/Throat PRN Donnie Mesa, MD       prochlorperazine (COMPAZINE) injection 10 mg  10 mg Intravenous Q6H PRN Cornett, Marcello Moores, MD   10 mg at 04/18/22 0112   thiamine (VITAMIN B1) tablet 100 mg  100 mg Oral Daily Cornett, Thomas, MD       Or   thiamine (VITAMIN B1) injection 100 mg  100 mg Intravenous Daily Cornett, Marcello Moores, MD   100 mg at 04/22/22 0802    Musculoskeletal: Strength & Muscle Tone: within normal limits Gait & Station: normal Patient leans: N/A  Psychiatric Specialty Exam:  Presentation  General Appearance:  Appropriate for Environment; Casual  Eye Contact: Good  Speech: Clear and Coherent; Normal Rate  Speech Volume: Normal  Handedness: Right   Mood and Affect  Mood: Euthymic  Affect: Congruent; Appropriate   Thought Process  Thought Processes: Coherent; Linear  Descriptions of Associations:Intact  Orientation:Full (Time, Place and Person)  Thought Content:Logical  History of Schizophrenia/Schizoaffective disorder:No data recorded Duration of Psychotic Symptoms:No data recorded Hallucinations:Hallucinations: None  Ideas of Reference:None  Suicidal Thoughts:Suicidal Thoughts: No  Homicidal Thoughts:Homicidal Thoughts: No   Sensorium  Memory: Immediate Good;  Recent Good; Remote Good  Judgment: Fair  Insight: Good   Executive Functions  Concentration: Fair  Attention Span: Fair  Recall: [place] of Knowledge: Fair  Language: Fair   Psychomotor Activity  Psychomotor Activity: Psychomotor Activity: Normal   Assets  Assets: Communication Skills; Desire for Improvement; Physical Health; Resilience; Social Support   Sleep  Sleep: Sleep: Fair   Physical Exam: Physical Exam Vitals and nursing note reviewed.  Constitutional:      General: She is awake.     Appearance: Normal appearance. She is well-developed and normal weight.  HENT:     Head: Normocephalic.     Mouth/Throat:     Comments: hoarseness Cardiovascular:     Rate and Rhythm: Normal rate.  Skin:    General: Skin is warm.     Capillary Refill: Capillary refill takes less than 2 seconds.  Neurological:     General: No focal deficit present.     Mental Status: She is alert and oriented to person, place, and time. Mental status is at baseline.  Psychiatric:        Mood and Affect: Mood normal.        Behavior: Behavior normal. Behavior is cooperative.  Thought Content: Thought content normal.        Judgment: Judgment normal.    Review of Systems  HENT: Negative.    Eyes: Negative.   Respiratory: Negative.    Cardiovascular: Negative.   Genitourinary: Negative.   Musculoskeletal: Negative.   Skin: Negative.   Neurological: Negative.   Endo/Heme/Allergies: Negative.   Psychiatric/Behavioral:  Positive for substance abuse. Negative for depression, hallucinations, memory loss and suicidal ideas (ETOH). The patient is nervous/anxious. The patient does not have insomnia.   All other systems reviewed and are negative.  Blood pressure 129/88, pulse 98, temperature 97.6 F (36.4 C), temperature source Oral, resp. rate (!) 21, height '5\' 2"'$  (1.575 m), weight 49.4 kg, last menstrual period 01/08/2011, SpO2 94 %. Body mass index is 19.94  kg/m.  Treatment Plan Summary: Daily contact with patient to assess and evaluate symptoms and progress in treatment, Medication management, and Plan    -Continue delirium precautions, patient on day for hospitalization, increased risk for developing delirium associated with surgery, ICU, and other medical comorbidities.  -Continue CIWAA and Lorazepam detox protocol -Will schedule Haldol '2mg'$  po/IM q8 hr for agitation.  -Will monitor Qtc for prolongation. EKG obtained on 10/26; 442  Psychiatry will sign off at this time. Patient with no disruptive behaviors, agitation, aggression, hallucinations, psychosis in 72 hours. SHe appears clinically stable at this time from a psychiatric standpoint.   Disposition: No evidence of imminent risk to self or others at present.   Patient does not meet criteria for psychiatric inpatient admission. Supportive therapy provided about ongoing stressors. Discussed crisis plan, support from social network, calling 911, coming to the Emergency Department, and calling Suicide Hotline.  Suella Broad, FNP 04/22/2022 2:04 PM

## 2022-04-23 DIAGNOSIS — K56609 Unspecified intestinal obstruction, unspecified as to partial versus complete obstruction: Secondary | ICD-10-CM | POA: Diagnosis not present

## 2022-04-23 LAB — BASIC METABOLIC PANEL
Anion gap: 7 (ref 5–15)
BUN: <5 mg/dL — ABNORMAL LOW (ref 6–20)
CO2: 30 mmol/L (ref 22–32)
Calcium: 8.4 mg/dL — ABNORMAL LOW (ref 8.9–10.3)
Chloride: 102 mmol/L (ref 98–111)
Creatinine, Ser: 0.4 mg/dL — ABNORMAL LOW (ref 0.44–1.00)
GFR, Estimated: >60 mL/min (ref >60)
Glucose, Bld: 118 mg/dL — ABNORMAL HIGH (ref 70–99)
Potassium: 3.9 mmol/L (ref 3.5–5.1)
Sodium: 139 mmol/L (ref 135–145)

## 2022-04-23 LAB — CBC
HCT: 38.2 % (ref 36.0–46.0)
HCT: 38.5 % (ref 36.0–46.0)
Hemoglobin: 12.7 g/dL (ref 12.0–15.0)
Hemoglobin: 12.8 g/dL (ref 12.0–15.0)
MCH: 30 pg (ref 26.0–34.0)
MCH: 29.4 pg (ref 26.0–34.0)
MCHC: 33.2 g/dL (ref 30.0–36.0)
MCHC: 33.2 g/dL (ref 30.0–36.0)
MCV: 90.1 fL (ref 80.0–100.0)
MCV: 88.3 fL (ref 80.0–100.0)
Platelets: 241 10*3/uL (ref 150–400)
Platelets: 250 10*3/uL (ref 150–400)
RBC: 4.24 MIL/uL (ref 3.87–5.11)
RBC: 4.36 MIL/uL (ref 3.87–5.11)
RDW: 13.6 % (ref 11.5–15.5)
RDW: 13.5 % (ref 11.5–15.5)
WBC: 7.9 10*3/uL (ref 4.0–10.5)
WBC: 12.2 10*3/uL — ABNORMAL HIGH (ref 4.0–10.5)
nRBC: 0 % (ref 0.0–0.2)
nRBC: 0 % (ref 0.0–0.2)

## 2022-04-23 LAB — GLUCOSE, CAPILLARY
Glucose-Capillary: 134 mg/dL — ABNORMAL HIGH (ref 70–99)
Glucose-Capillary: 216 mg/dL — ABNORMAL HIGH (ref 70–99)
Glucose-Capillary: 192 mg/dL — ABNORMAL HIGH (ref 70–99)

## 2022-04-23 MED ORDER — DOCUSATE SODIUM 100 MG PO CAPS
100.0000 mg | ORAL_CAPSULE | Freq: Two times a day (BID) | ORAL | Status: DC
  Administered 2022-04-23 – 2022-04-28 (×12): 100 mg via ORAL
  Filled 2022-04-23 (×13): qty 1

## 2022-04-23 NOTE — Evaluation (Signed)
Physical Therapy Evaluation Patient Details Name: [name] MRN: [id number] DOB: 1966-02-25 Today's Date: 04/23/2022  History of Present Illness  56 y.o. female presents to [facility] hospital on 04/17/2022 with abdominal pain. Pt recently admitted 10/13-10/21 with SBO, left AMA on 10/21. Imaging found continued SBO. Pt underwent ex-lap with small bowel resection on 10/27. PMH includes afib, COPD, DM, neuropathy, HTN, bipolar disorder, schizophrenia, anxiety, depression, ETOH abuse, seizure.  Clinical Impression  Pt presents to PT with deficits in endurance, power, gait, balance. Pt is able to ambulate for household distances with support of RW, not requiring physical assistance from PT this session. Pt does begin to bleed from inferior portion of incision site during ambulation, RN aware, bleeding stops after ~3 minutes of sitting and resting. Pt will benefit from continued aggressive mobilization in an effort to restore independence.       Recommendations for follow up therapy are one component of a multi-disciplinary discharge planning process, led by the attending physician.  Recommendations may be updated based on patient status, additional functional criteria and insurance authorization.  Follow Up Recommendations No PT follow up      Assistance Recommended at Discharge PRN  Patient can return home with the following  A little help with bathing/dressing/bathroom;Assistance with cooking/housework;Help with stairs or ramp for entrance;Assist for transportation    Equipment Recommendations None recommended by PT (pt reports owning a RW)  Recommendations for Other Services       Functional Status Assessment Patient has had a recent decline in their functional status and demonstrates the ability to make significant improvements in function in a reasonable and predictable amount of time.     Precautions / Restrictions Precautions Precautions: None Restrictions Weight Bearing  Restrictions: No      Mobility  Bed Mobility Overal bed mobility: Needs Assistance Bed Mobility: Supine to Sit     Supine to sit: Supervision          Transfers Overall transfer level: Needs assistance Equipment used: Rolling walker (2 wheels) Transfers: Sit to/from Stand Sit to Stand: Supervision                Ambulation/Gait Ambulation/Gait assistance: Supervision Gait Distance (Feet): 400 Feet Assistive device: Rolling walker (2 wheels) Gait Pattern/deviations: Step-through pattern Gait velocity: functional Gait velocity interpretation: 1.31 - 2.62 ft/sec, indicative of limited community ambulator   General Gait Details: steady step-through gait, distance limited by bleeding from incision site  Stairs            Wheelchair Mobility    Modified Rankin (Stroke Patients Only)       Balance Overall balance assessment: Mild deficits observed, not formally tested                                           Pertinent Vitals/Pain Pain Assessment Pain Assessment: 0-10 Pain Score: 7  Pain Location: abdomen Pain Descriptors / Indicators: Sore Pain Intervention(s): Monitored during session    Home Living Family/patient expects to be discharged to:: Private residence Living Arrangements: Other relatives Available Help at Discharge: Family;Available PRN/intermittently Type of Home: House Home Access: Stairs to enter Entrance Stairs-Rails: Can reach both;Right;Left Entrance Stairs-Number of Steps: 8   Home Layout: One level Home Equipment: Conservation officer, nature (2 wheels);Cane - single point Additional Comments: brother currently living with pt. Spouse lives with mom taking care of her    Prior Function  Prior Level of Function : Independent/Modified Independent             Mobility Comments: PRN use of SPC vs RW       Hand Dominance   Dominant Hand: Right    Extremity/Trunk Assessment   Upper Extremity Assessment Upper  Extremity Assessment: Overall WFL for tasks assessed    Lower Extremity Assessment Lower Extremity Assessment: Overall WFL for tasks assessed    Cervical / Trunk Assessment Cervical / Trunk Assessment: Other exceptions Cervical / Trunk Exceptions: ex-lap, bleeding from inferior portion of incision site when ambulating. RN made aware. Bleeding stops after ~3 minutes of sitting  Communication   Communication: No difficulties  Cognition Arousal/Alertness: Awake/alert Behavior During Therapy: WFL for tasks assessed/performed Overall Cognitive Status: Within Functional Limits for tasks assessed                                          General Comments General comments (skin integrity, edema, etc.): VSS on RA, bleeding noted from inferior portion of incision site when ambulating, dripping onto gown and onto floor. RN made aware, bleeding appears to stop after sitting and returning to supine after ~3 min    Exercises     Assessment/Plan    PT Assessment Patient needs continued PT services  PT Problem List Decreased activity tolerance;Decreased mobility;Decreased knowledge of precautions       PT Treatment Interventions DME instruction;Gait training;Stair training;Functional mobility training;Therapeutic activities;Balance training;Neuromuscular re-education;Patient/family education    PT Goals (Current goals can be found in the Care Plan section)  Acute Rehab PT Goals Patient Stated Goal: to go home, return to independence PT Goal Formulation: With patient Time For Goal Achievement: 05/07/22 Potential to Achieve Goals: Good    Frequency Min 3X/week     Co-evaluation               AM-PAC PT "6 Clicks" Mobility  Outcome Measure Help needed turning from your back to your side while in a flat bed without using bedrails?: A Little Help needed moving from lying on your back to sitting on the side of a flat bed without using bedrails?: A Little Help needed  moving to and from a bed to a chair (including a wheelchair)?: A Little Help needed standing up from a chair using your arms (e.g., wheelchair or bedside chair)?: A Little Help needed to walk in hospital room?: A Little Help needed climbing 3-5 steps with a railing? : A Little 6 Click Score: 18    End of Session   Activity Tolerance: Patient tolerated treatment well Patient left: in bed;with call bell/phone within reach;with nursing/sitter in room Nurse Communication: Mobility status PT Visit Diagnosis: Other abnormalities of gait and mobility (R26.89)    Time: 7035-0093 PT Time Calculation (min) (ACUTE ONLY): 20 min   Charges:   PT Evaluation $PT Eval Low Complexity: 1 Low          Zenaida Niece, PT, DPT Acute Rehabilitation Office [phone]   Zenaida Niece 04/23/2022, 4:52 PM

## 2022-04-23 NOTE — Progress Notes (Signed)
Received a call from bedside RN regarding the patient bleeding from site of abdominal staples.  Presented at bedside.  Bleeding noted after dressing change.  Not hypotensive.  Discussed with general surgery on call.  Recommended to obtain labs and hold off on abdominal CT scan for now.    Chemical DVT prophylaxis SQ Lovenox has been held due to the bleeding.  PRN pain management in place.  Time: 35 minutes.

## 2022-04-23 NOTE — Progress Notes (Signed)
PROGRESS NOTE    [name]  GMW:[id number] DOB: [birth year] DOA: 04/17/2022 PCP: [name], MD    Brief Narrative:  56 y.o. female with medical history significant of  Schizo affective d/o , COPD, Anemia, GERD, HTN, gastric bypass in 2017 by Dr. Hillary Bow recent interim history of  seizure s/p fall diagnosed 8/23.  In addition to history of  admission 7/23-8/23 for suicide attempt complicated by aspiration pneumonia with acute respiratory failure that was vent dependent. Had trach placed during that stay.  During this admission patient also developed colonic ileus and was seen by GI and placed on Linzess and lactulose.She was recently admitted on on 10/13-10/21 for SBO and was treated conservatively with small bowel protocol. She had persistent obstruction and left AMA on 10/21.  Went to the OR on 10/27.     Assessment and Plan:  Small bowel obstruction/sp laparotomy/lysis of adhesions -General surgery following- + flatus, tolerating clear diet -pain control per GS- off PCA   Hypokalemia -replete   Polysubstance abuse ([place]) +cocaine and alcohol abuse -UDS never done   Schizoaffective disorder, bipolar type ([place]) History of suicide attempt in 12/2021 Has not been taking medication per family No SI/HI.  -Psychiatry was consulted, patient started on Haldol 2 mg p.o./IM every 8 hours as needed for agitation -delirium precautions-- do not think patient has capacity to currently make decisions     Type 2 diabetes mellitus (HCC) A1C of 6.5 this month Hold metformin -SSI   Essential hypertension -adjust meds as able   COPD (chronic obstructive pulmonary disease) (HCC) No s/s of exacerbation Continue SABA prn and symbicort    Reported Seizure ([place]) Seizure precautions On no medication,  ? Alcohol withdrawal  Not on vimpat and has not been on this. Verified with family    GERD (gastroesophageal reflux disease) -PPI   Memory loss ? Anoxic brain injury -will  place referral to neurology up on d/c   DVT prophylaxis: enoxaparin (LOVENOX) injection 40 mg Start: 04/20/22 2200 SCDs Start: 04/17/22 1618    Code Status: Full Code Family Communication:   Disposition Plan:  Level of care: Progressive Status is: Inpatient Remains inpatient appropriate because: needs    Consultants:  Psych GS    Subjective: + flatus  Objective: Vitals:   04/23/22 0015 04/23/22 0329 04/23/22 0400 04/23/22 0810  BP:  117/76  118/84  Pulse:  84  75  Resp:  '16 20 20  '$ Temp: 98.1 F (36.7 C) 98.1 F (36.7 C)  97.8 F (36.6 C)  TempSrc: Oral Oral  Oral  SpO2:  97% 95% 98%  Weight:      Height:       No intake or output data in the 24 hours ending 04/23/22 1043 Filed Weights   04/17/22 0937  Weight: 49.4 kg    Examination:   General: Appearance:    Thin female in no acute distress     Lungs:     respirations unlabored  Heart:    Normal heart rate.    MS:   All extremities are intact.    Neurologic:   Awake, alert       Data Reviewed: I have personally reviewed following labs and imaging studies  CBC: Recent Labs  Lab 04/17/22 1018 04/18/22 0052 04/19/22 0324 04/21/22 0354 04/23/22 0548  WBC 7.3 9.7 16.5* 7.6 7.9  NEUTROABS 4.0  --   --   --   --   HGB 12.9 13.0 14.7 11.7* 12.7  HCT 38.1 39.7  44.2 35.7* 38.2  MCV 88.6 88.6 89.7 90.6 90.1  PLT 352 348 421* 252 332   Basic Metabolic Panel: Recent Labs  Lab 04/17/22 1018 04/18/22 0052 04/19/22 0324 04/20/22 0320 04/23/22 0548  NA 135 136 136 138 139  K 3.0* 3.4* 4.2 3.6 3.9  CL 102 102 96* 99 102  CO2 25 27 32 31 30  GLUCOSE 94 86 133* 83 118*  BUN <5* <5* <5* <5* <5*  CREATININE 0.49 0.37* 0.62 0.40* 0.40*  CALCIUM 7.9* 8.1* 8.2* 8.1* 8.4*  MG 1.7  --   --   --   --    GFR: Estimated Creatinine Clearance: 60.5 mL/min (A) (by C-G formula based on SCr of 0.4 mg/dL (L)). Liver Function Tests: Recent Labs  Lab 04/17/22 1018 04/19/22 0324 04/20/22 0320  AST 16 19  14*  ALT '13 11 11  '$ ALKPHOS 88 104 81  BILITOT 0.4 0.8 0.5  PROT 5.8* 6.3* 5.0*  ALBUMIN 2.7* 2.6* 2.2*   Recent Labs  Lab 04/17/22 1018  LIPASE 40   No results for input(s): "AMMONIA" in the last 168 hours. Coagulation Profile: Recent Labs  Lab 04/17/22 1828  INR 1.1   Cardiac Enzymes: No results for input(s): "CKTOTAL", "CKMB", "CKMBINDEX", "TROPONINI" in the last 168 hours. BNP (last 3 results) No results for input(s): "PROBNP" in the last 8760 hours. HbA1C: No results for input(s): "HGBA1C" in the last 72 hours. CBG: Recent Labs  Lab 04/18/22 1402 04/22/22 1648 04/22/22 2027 04/23/22 0809  GLUCAP 151* 211* 257* 134*   Lipid Profile: No results for input(s): "CHOL", "HDL", "LDLCALC", "TRIG", "CHOLHDL", "LDLDIRECT" in the last 72 hours. Thyroid Function Tests: No results for input(s): "TSH", "T4TOTAL", "FREET4", "T3FREE", "THYROIDAB" in the last 72 hours. Anemia Panel: No results for input(s): "VITAMINB12", "FOLATE", "FERRITIN", "TIBC", "IRON", "RETICCTPCT" in the last 72 hours. Sepsis Labs: Recent Labs  Lab 04/17/22 1018 04/17/22 1258 04/17/22 1828 04/18/22 0052  LATICACIDVEN 1.9 2.0* 1.8 1.2    No results found for this or any previous visit (from the past 240 hour(s)).       Radiology Studies: No results found.      Scheduled Meds:  docusate sodium  100 mg Oral BID   enoxaparin (LOVENOX) injection  40 mg Subcutaneous Q24H   haloperidol  2 mg Oral TID   Or   haloperidol lactate  2 mg Intramuscular TID   insulin aspart  0-9 Units Subcutaneous TID WC   ketorolac  15 mg Intravenous Q6H   nicotine  21 mg Transdermal Daily   pantoprazole (PROTONIX) IV  40 mg Intravenous Q24H   thiamine  100 mg Oral Daily   Or   thiamine  100 mg Intravenous Daily   Continuous Infusions:  folic acid 1 mg in sodium chloride 0.9 % 50 mL IVPB 1 mg (04/23/22 1012)   lactated ringers 75 mL/hr at 04/23/22 0614     LOS: 6 days    Time spent: 45 minutes spent  on chart review, discussion with nursing staff, consultants, updating family and interview/physical exam; more than 50% of that time was spent in counseling and/or coordination of care.    Geradine Girt, DO [facility] Available via Epic secure chat 7am-7pm After these hours, please refer to coverage provider listed on [email or web address] 04/23/2022, 10:43 AM

## 2022-04-23 NOTE — Care Management Important Message (Signed)
Important Message  Patient Details  Name: [name] MRN: [id number] Date of Birth: May 24, 1966   Medicare Important Message Given:  Yes     Orbie Pyo 04/23/2022, 2:36 PM

## 2022-04-23 NOTE — Progress Notes (Signed)
[facility] Progress Note  5 Days Post-Op  Subjective: CC-  States that her abdominal pain is improving. Denies any nausea or vomiting. Reports passing some flatus this morning. She also states she had a BM yesterday but confirmed with RN this is not the case. She is tolerated a lot of full liquids.  Objective: Vital signs in last 24 hours: Temp:  [97.8 F (36.6 C)-98.1 F (36.7 C)] 97.8 F (36.6 C) (11/01 0810) Pulse Rate:  [75-127] 75 (11/01 0810) Resp:  [16-31] 20 (11/01 0810) BP: (117-143)/(76-85) 118/84 (11/01 0810) SpO2:  [92 %-98 %] 98 % (11/01 0810) FiO2 (%):  [21 %] 21 % (10/31 1610) Last BM Date :  (PTA)  Intake/Output from previous day: No intake/output data recorded. Intake/Output this shift: No intake/output data recorded.  PE: General: Alert, NAD Lungs: Respiratory effort nonlabored on supplemental O2 Abd: soft, mild distension, +BS, mild appropriate TTP at midline incision, incision cdi with staples present and some faint ecchymosis at central aspect/ no cellulitis or purulent drainage  Lab Results:  Recent Labs    04/21/22 0354 04/23/22 0548  WBC 7.6 7.9  HGB 11.7* 12.7  HCT 35.7* 38.2  PLT 252 241   BMET Recent Labs    04/23/22 0548  NA 139  K 3.9  CL 102  CO2 30  GLUCOSE 118*  BUN <5*  CREATININE 0.40*  CALCIUM 8.4*   PT/INR No results for input(s): "LABPROT", "INR" in the last 72 hours. CMP     Component Value Date/Time   NA 139 04/23/2022 0548   K 3.9 04/23/2022 0548   CL 102 04/23/2022 0548   CO2 30 04/23/2022 0548   GLUCOSE 118 (H) 04/23/2022 0548   BUN <5 (L) 04/23/2022 0548   CREATININE 0.40 (L) 04/23/2022 0548   CREATININE 0.57 03/28/2021 0000   CALCIUM 8.4 (L) 04/23/2022 0548   PROT 5.0 (L) 04/20/2022 0320   ALBUMIN 2.2 (L) 04/20/2022 0320   AST 14 (L) 04/20/2022 0320   ALT 11 04/20/2022 0320   ALKPHOS 81 04/20/2022 0320   BILITOT 0.5 04/20/2022 0320   GFRNONAA >60 04/23/2022 0548   GFRAA >60 08/03/2018  1514   Lipase     Component Value Date/Time   LIPASE 40 04/17/2022 1018       Studies/Results: No results found.  Anti-infectives: Anti-infectives (From admission, onward)    Start     Dose/Rate Route Frequency Ordered Stop   04/18/22 1100  cefoTEtan (CEFOTAN) 2 g in sodium chloride 0.9 % 100 mL IVPB        2 g 200 mL/hr over 30 Minutes Intravenous On call to O.R. 04/18/22 1053 04/19/22 0840   04/18/22 1059  sodium chloride 0.9 % with cefoTEtan (CEFOTAN) ADS Med       Note to Pharmacy: [place] Mo E: cabinet override      04/18/22 1059 04/18/22 1243        Assessment/Plan SBO H/o gastric bypass 2017 Dr. Excell Seltzer, history of laparoscopic hysterectomy - CT 10/13 w/ SBO and TP poster pelvis. Mild twisting of mesentery within RLQ. Small HH -Admitted to hospitalist service 10/13 through 10/20 for SBO managed conservatively.  NG tube removed during admission but obstruction did not completely resolve.  She left AMA 10/20 after recommendation for continued n.p.o. status and NG tube replacement -CT 10/24 and 10/26 done without oral contrast showing diffuse small bowel distention concerning for ileus versus SBO however favor pSBO given history  POD#5 s/p exploratory laparotomy/ small bowel resection/ lysis  of adhesions 04/18/22 - Dr. Brantley Stage -Intraop findings of Small bowel stricture -Surgical path without IBD or neoplasm. Fibrous adhesion and benign mesothelial cell proliferation - Reports passing flatus and she is tolerating FLD without n/v. Continue fulls today, plan to advance to soft diet once she has a bowel movement - honeycomb removed today, mild ecchymosis noted but no cellulitis or purulent drainage - monitor - Mobilize, will ask PT to see   FEN: FLD, IVF per primary ID: none VTE: lovenox    Alcohol use Tobacco use DM GERD COPD Afib  HTN    LOS: 6 days    Wellington Hampshire, [facility] 04/23/2022, 10:28 AM Please see Amion for pager number  during day hours 7:00am-4:30pm

## 2022-04-24 ENCOUNTER — Inpatient Hospital Stay (HOSPITAL_COMMUNITY): Payer: Medicare Other | Admitting: Anesthesiology

## 2022-04-24 ENCOUNTER — Encounter (HOSPITAL_COMMUNITY): Payer: Self-pay | Admitting: Family Medicine

## 2022-04-24 ENCOUNTER — Other Ambulatory Visit: Payer: Self-pay

## 2022-04-24 ENCOUNTER — Encounter (HOSPITAL_COMMUNITY)
Admission: EM | Disposition: A | Discharge status: Home / Self Care | Payer: Self-pay | Source: Home / Self Care | Attending: Internal Medicine

## 2022-04-24 DIAGNOSIS — F17210 Nicotine dependence, cigarettes, uncomplicated: Secondary | ICD-10-CM

## 2022-04-24 DIAGNOSIS — K56609 Unspecified intestinal obstruction, unspecified as to partial versus complete obstruction: Secondary | ICD-10-CM | POA: Diagnosis not present

## 2022-04-24 DIAGNOSIS — J449 Chronic obstructive pulmonary disease, unspecified: Secondary | ICD-10-CM

## 2022-04-24 DIAGNOSIS — I10 Essential (primary) hypertension: Secondary | ICD-10-CM

## 2022-04-24 DIAGNOSIS — T8131XA Disruption of external operation (surgical) wound, not elsewhere classified, initial encounter: Secondary | ICD-10-CM

## 2022-04-24 HISTORY — PX: LAPAROTOMY: SHX154

## 2022-04-24 LAB — BASIC METABOLIC PANEL
Anion gap: 9 (ref 5–15)
BUN: <5 mg/dL — ABNORMAL LOW (ref 6–20)
CO2: 30 mmol/L (ref 22–32)
Calcium: 8.4 mg/dL — ABNORMAL LOW (ref 8.9–10.3)
Chloride: 99 mmol/L (ref 98–111)
Creatinine, Ser: 0.42 mg/dL — ABNORMAL LOW (ref 0.44–1.00)
GFR, Estimated: >60 mL/min (ref >60)
Glucose, Bld: 116 mg/dL — ABNORMAL HIGH (ref 70–99)
Potassium: 3.5 mmol/L (ref 3.5–5.1)
Sodium: 138 mmol/L (ref 135–145)

## 2022-04-24 LAB — GLUCOSE, CAPILLARY
Glucose-Capillary: 144 mg/dL — ABNORMAL HIGH (ref 70–99)
Glucose-Capillary: 127 mg/dL — ABNORMAL HIGH (ref 70–99)
Glucose-Capillary: 122 mg/dL — ABNORMAL HIGH (ref 70–99)
Glucose-Capillary: 195 mg/dL — ABNORMAL HIGH (ref 70–99)
Glucose-Capillary: 152 mg/dL — ABNORMAL HIGH (ref 70–99)

## 2022-04-24 LAB — CBC
HCT: 38.2 % (ref 36.0–46.0)
HCT: 36.4 % (ref 36.0–46.0)
Hemoglobin: 12.8 g/dL (ref 12.0–15.0)
Hemoglobin: 12.3 g/dL (ref 12.0–15.0)
MCH: 29.5 pg (ref 26.0–34.0)
MCH: 29.8 pg (ref 26.0–34.0)
MCHC: 33.5 g/dL (ref 30.0–36.0)
MCHC: 33.8 g/dL (ref 30.0–36.0)
MCV: 88 fL (ref 80.0–100.0)
MCV: 88.1 fL (ref 80.0–100.0)
Platelets: 264 10*3/uL (ref 150–400)
Platelets: 240 10*3/uL (ref 150–400)
RBC: 4.34 MIL/uL (ref 3.87–5.11)
RBC: 4.13 MIL/uL (ref 3.87–5.11)
RDW: 13.4 % (ref 11.5–15.5)
RDW: 13.7 % (ref 11.5–15.5)
WBC: 11.2 10*3/uL — ABNORMAL HIGH (ref 4.0–10.5)
WBC: 10.2 10*3/uL (ref 4.0–10.5)
nRBC: 0 % (ref 0.0–0.2)
nRBC: 0 % (ref 0.0–0.2)

## 2022-04-24 LAB — COMPREHENSIVE METABOLIC PANEL
ALT: 12 U/L (ref 0–44)
AST: 12 U/L — ABNORMAL LOW (ref 15–41)
Albumin: 2.5 g/dL — ABNORMAL LOW (ref 3.5–5.0)
Alkaline Phosphatase: 82 U/L (ref 38–126)
Anion gap: 10 (ref 5–15)
BUN: <5 mg/dL — ABNORMAL LOW (ref 6–20)
CO2: 28 mmol/L (ref 22–32)
Calcium: 8.5 mg/dL — ABNORMAL LOW (ref 8.9–10.3)
Chloride: 100 mmol/L (ref 98–111)
Creatinine, Ser: 0.46 mg/dL (ref 0.44–1.00)
GFR, Estimated: >60 mL/min (ref >60)
Glucose, Bld: 136 mg/dL — ABNORMAL HIGH (ref 70–99)
Potassium: 3.8 mmol/L (ref 3.5–5.1)
Sodium: 138 mmol/L (ref 135–145)
Total Bilirubin: 0.2 mg/dL — ABNORMAL LOW (ref 0.3–1.2)
Total Protein: 6.3 g/dL — ABNORMAL LOW (ref 6.5–8.1)

## 2022-04-24 LAB — MAGNESIUM: Magnesium: 1.7 mg/dL (ref 1.7–2.4)

## 2022-04-24 LAB — PHOSPHORUS: Phosphorus: 3.8 mg/dL (ref 2.5–4.6)

## 2022-04-24 SURGERY — LAPAROTOMY, EXPLORATORY
Anesthesia: General | Site: Abdomen

## 2022-04-24 MED ORDER — SUCCINYLCHOLINE CHLORIDE 200 MG/10ML IV SOSY
PREFILLED_SYRINGE | INTRAVENOUS | Status: AC
  Filled 2022-04-24: qty 10

## 2022-04-24 MED ORDER — MEPERIDINE HCL 25 MG/ML IJ SOLN: 6.2500 mg | INTRAMUSCULAR | Status: DC | PRN

## 2022-04-24 MED ORDER — OXYCODONE HCL 5 MG PO TABS: 5.0000 mg | ORAL_TABLET | Freq: Once | ORAL | Status: DC | PRN

## 2022-04-24 MED ORDER — MIDAZOLAM HCL 2 MG/2ML IJ SOLN: 0.5000 mg | Freq: Once | INTRAMUSCULAR | Status: DC | PRN

## 2022-04-24 MED ORDER — MIDAZOLAM HCL 2 MG/2ML IJ SOLN
INTRAMUSCULAR | Status: DC | PRN
  Administered 2022-04-24 (×2): 1 mg via INTRAVENOUS

## 2022-04-24 MED ORDER — SUGAMMADEX SODIUM 200 MG/2ML IV SOLN
INTRAVENOUS | Status: DC | PRN
  Administered 2022-04-24: 200 mg via INTRAVENOUS
  Administered 2022-04-24: 100 mg via INTRAVENOUS

## 2022-04-24 MED ORDER — CHLORHEXIDINE GLUCONATE CLOTH 2 % EX PADS: 6.0000 | MEDICATED_PAD | Freq: Once | CUTANEOUS | Status: DC

## 2022-04-24 MED ORDER — LACTATED RINGERS IV SOLN: INTRAVENOUS | Status: DC

## 2022-04-24 MED ORDER — HYDROMORPHONE HCL 1 MG/ML IJ SOLN
0.5000 mg | INTRAMUSCULAR | Status: DC | PRN
  Administered 2022-04-24 – 2022-04-25 (×6): 1 mg via INTRAVENOUS
  Filled 2022-04-24 (×7): qty 1

## 2022-04-24 MED ORDER — LIDOCAINE 2% (20 MG/ML) 5 ML SYRINGE
INTRAMUSCULAR | Status: DC | PRN
  Administered 2022-04-24: 40 mg via INTRAVENOUS

## 2022-04-24 MED ORDER — DEXMEDETOMIDINE HCL IN NACL 80 MCG/20ML IV SOLN
INTRAVENOUS | Status: DC | PRN
  Administered 2022-04-24: 8 ug via BUCCAL
  Administered 2022-04-24: 4 ug via BUCCAL

## 2022-04-24 MED ORDER — DEXAMETHASONE SODIUM PHOSPHATE 10 MG/ML IJ SOLN
INTRAMUSCULAR | Status: DC | PRN
  Administered 2022-04-24: 5 mg via INTRAVENOUS

## 2022-04-24 MED ORDER — PROMETHAZINE HCL 25 MG/ML IJ SOLN: 6.2500 mg | INTRAMUSCULAR | Status: DC | PRN

## 2022-04-24 MED ORDER — ORAL CARE MOUTH RINSE: 15.0000 mL | Freq: Once | OROMUCOSAL | Status: AC

## 2022-04-24 MED ORDER — CHLORHEXIDINE GLUCONATE 0.12 % MT SOLN
OROMUCOSAL | Status: AC
  Administered 2022-04-24: 15 mL via OROMUCOSAL
  Filled 2022-04-24: qty 15

## 2022-04-24 MED ORDER — PROPOFOL 10 MG/ML IV BOLUS
INTRAVENOUS | Status: DC | PRN
  Administered 2022-04-24: 100 mg via INTRAVENOUS
  Administered 2022-04-24: 30 mg via INTRAVENOUS
  Administered 2022-04-24: 20 mg via INTRAVENOUS

## 2022-04-24 MED ORDER — ONDANSETRON HCL 4 MG/2ML IJ SOLN
INTRAMUSCULAR | Status: AC
  Filled 2022-04-24: qty 2

## 2022-04-24 MED ORDER — PHENYLEPHRINE 80 MCG/ML (10ML) SYRINGE FOR IV PUSH (FOR BLOOD PRESSURE SUPPORT)
PREFILLED_SYRINGE | INTRAVENOUS | Status: AC
  Filled 2022-04-24: qty 10

## 2022-04-24 MED ORDER — 0.9 % SODIUM CHLORIDE (POUR BTL) OPTIME
TOPICAL | Status: DC | PRN
  Administered 2022-04-24 (×2): 1000 mL

## 2022-04-24 MED ORDER — FENTANYL CITRATE (PF) 250 MCG/5ML IJ SOLN
INTRAMUSCULAR | Status: DC | PRN
  Administered 2022-04-24 (×5): 50 ug via INTRAVENOUS

## 2022-04-24 MED ORDER — SODIUM CHLORIDE 0.9 % IV SOLN
2.0000 g | INTRAVENOUS | Status: AC
  Administered 2022-04-24: 2 g via INTRAVENOUS

## 2022-04-24 MED ORDER — ROCURONIUM BROMIDE 10 MG/ML (PF) SYRINGE
PREFILLED_SYRINGE | INTRAVENOUS | Status: DC | PRN
  Administered 2022-04-24: 40 mg via INTRAVENOUS

## 2022-04-24 MED ORDER — FOLIC ACID 1 MG PO TABS
1.0000 mg | ORAL_TABLET | Freq: Every day | ORAL | Status: DC
  Administered 2022-04-24 – 2022-04-29 (×6): 1 mg via ORAL
  Filled 2022-04-24 (×6): qty 1

## 2022-04-24 MED ORDER — SODIUM CHLORIDE 0.9 % IV SOLN
INTRAVENOUS | Status: AC
  Filled 2022-04-24: qty 2

## 2022-04-24 MED ORDER — DEXAMETHASONE SODIUM PHOSPHATE 10 MG/ML IJ SOLN
INTRAMUSCULAR | Status: AC
  Filled 2022-04-24: qty 1

## 2022-04-24 MED ORDER — ROCURONIUM BROMIDE 10 MG/ML (PF) SYRINGE
PREFILLED_SYRINGE | INTRAVENOUS | Status: AC
  Filled 2022-04-24: qty 10

## 2022-04-24 MED ORDER — HYDROMORPHONE HCL 1 MG/ML IJ SOLN
INTRAMUSCULAR | Status: AC
  Filled 2022-04-24: qty 1

## 2022-04-24 MED ORDER — MIDAZOLAM HCL 2 MG/2ML IJ SOLN
INTRAMUSCULAR | Status: AC
  Filled 2022-04-24: qty 2

## 2022-04-24 MED ORDER — SUCCINYLCHOLINE CHLORIDE 200 MG/10ML IV SOSY
PREFILLED_SYRINGE | INTRAVENOUS | Status: DC | PRN
  Administered 2022-04-24: 120 mg via INTRAVENOUS

## 2022-04-24 MED ORDER — ONDANSETRON HCL 4 MG/2ML IJ SOLN
INTRAMUSCULAR | Status: DC | PRN
  Administered 2022-04-24: 4 mg via INTRAVENOUS

## 2022-04-24 MED ORDER — CHLORHEXIDINE GLUCONATE 0.12 % MT SOLN: 15.0000 mL | Freq: Once | OROMUCOSAL | Status: AC

## 2022-04-24 MED ORDER — FENTANYL CITRATE (PF) 250 MCG/5ML IJ SOLN
INTRAMUSCULAR | Status: AC
  Filled 2022-04-24: qty 5

## 2022-04-24 MED ORDER — LIDOCAINE 2% (20 MG/ML) 5 ML SYRINGE
INTRAMUSCULAR | Status: AC
  Filled 2022-04-24: qty 5

## 2022-04-24 MED ORDER — OXYCODONE HCL 5 MG/5ML PO SOLN: 5.0000 mg | Freq: Once | ORAL | Status: DC | PRN

## 2022-04-24 MED ORDER — PANTOPRAZOLE SODIUM 40 MG PO TBEC
40.0000 mg | DELAYED_RELEASE_TABLET | Freq: Every day | ORAL | Status: DC
  Administered 2022-04-24 – 2022-04-29 (×6): 40 mg via ORAL
  Filled 2022-04-24 (×6): qty 1

## 2022-04-24 MED ORDER — PHENYLEPHRINE 80 MCG/ML (10ML) SYRINGE FOR IV PUSH (FOR BLOOD PRESSURE SUPPORT)
PREFILLED_SYRINGE | INTRAVENOUS | Status: DC | PRN
  Administered 2022-04-24: 80 ug via INTRAVENOUS
  Administered 2022-04-24: 160 ug via INTRAVENOUS

## 2022-04-24 MED ORDER — INSULIN ASPART 100 UNIT/ML IJ SOLN: 0.0000 [IU] | INTRAMUSCULAR | Status: DC | PRN

## 2022-04-24 MED ORDER — PHENYLEPHRINE HCL-NACL 20-0.9 MG/250ML-% IV SOLN
INTRAVENOUS | Status: DC | PRN
  Administered 2022-04-24: 20 ug/min via INTRAVENOUS

## 2022-04-24 MED ORDER — HYDROMORPHONE HCL 1 MG/ML IJ SOLN
0.2500 mg | INTRAMUSCULAR | Status: DC | PRN
  Administered 2022-04-24 (×2): 0.5 mg via INTRAVENOUS

## 2022-04-24 SURGICAL SUPPLY — 50 items
APL PRP STRL LF DISP 70% ISPRP (MISCELLANEOUS) ×1
BAG COUNTER SPONGE SURGICOUNT (BAG) ×1 IMPLANT
BAG SPNG CNTER NS LX DISP (BAG) ×1
BLADE CLIPPER SURG (BLADE) IMPLANT
BNDG GAUZE DERMACEA FLUFF 4 (GAUZE/BANDAGES/DRESSINGS) IMPLANT
BNDG GZE DERMACEA 4 6PLY (GAUZE/BANDAGES/DRESSINGS) ×1
CANISTER SUCT 3000ML PPV (MISCELLANEOUS) ×1 IMPLANT
CHLORAPREP W/TINT 26 (MISCELLANEOUS) ×1 IMPLANT
COVER SURGICAL LIGHT HANDLE (MISCELLANEOUS) ×1 IMPLANT
DRAPE LAPAROSCOPIC ABDOMINAL (DRAPES) ×1 IMPLANT
DRAPE WARM FLUID 44X44 (DRAPES) ×1 IMPLANT
DRSG OPSITE POSTOP 4X10 (GAUZE/BANDAGES/DRESSINGS) IMPLANT
DRSG OPSITE POSTOP 4X8 (GAUZE/BANDAGES/DRESSINGS) IMPLANT
ELECT BLADE 6.5 EXT (BLADE) IMPLANT
ELECT CAUTERY BLADE 6.4 (BLADE) ×1 IMPLANT
ELECT REM PT RETURN 9FT ADLT (ELECTROSURGICAL) ×1
ELECTRODE REM PT RTRN 9FT ADLT (ELECTROSURGICAL) ×1 IMPLANT
GAUZE PAD ABD 8X10 STRL (GAUZE/BANDAGES/DRESSINGS) IMPLANT
GAUZE SPONGE 4X4 12PLY STRL (GAUZE/BANDAGES/DRESSINGS) IMPLANT
GLOVE BIO SURGEON STRL SZ8 (GLOVE) ×1 IMPLANT
GLOVE BIOGEL PI IND STRL 8 (GLOVE) ×1 IMPLANT
GOWN STRL REUS W/ TWL LRG LVL3 (GOWN DISPOSABLE) ×1 IMPLANT
GOWN STRL REUS W/ TWL XL LVL3 (GOWN DISPOSABLE) ×1 IMPLANT
GOWN STRL REUS W/TWL LRG LVL3 (GOWN DISPOSABLE) ×1
GOWN STRL REUS W/TWL XL LVL3 (GOWN DISPOSABLE) ×1
HANDLE SUCTION POOLE (INSTRUMENTS) ×1 IMPLANT
KIT BASIN OR (CUSTOM PROCEDURE TRAY) ×1 IMPLANT
KIT TURNOVER KIT B (KITS) ×1 IMPLANT
LIGASURE IMPACT 36 18CM CVD LR (INSTRUMENTS) IMPLANT
NS IRRIG 1000ML POUR BTL (IV SOLUTION) ×2 IMPLANT
PACK GENERAL/GYN (CUSTOM PROCEDURE TRAY) ×1 IMPLANT
PAD ARMBOARD 7.5X6 YLW CONV (MISCELLANEOUS) ×1 IMPLANT
PENCIL SMOKE EVACUATOR (MISCELLANEOUS) ×1 IMPLANT
SPECIMEN JAR LARGE (MISCELLANEOUS) IMPLANT
SPONGE T-LAP 18X18 ~~LOC~~+RFID (SPONGE) IMPLANT
STAPLER VISISTAT 35W (STAPLE) ×1 IMPLANT
SUCTION POOLE HANDLE (INSTRUMENTS) ×1
SUT BOLSTER RETENT 3/16X1 3/4 (MISCELLANEOUS) ×1
SUT ETHILON 2 LR (SUTURE) IMPLANT
SUT NOVA NAB DX-16 0-1 5-0 T12 (SUTURE) IMPLANT
SUT PDS AB 1 TP1 96 (SUTURE) ×2 IMPLANT
SUT SILK 2 0 SH CR/8 (SUTURE) ×1 IMPLANT
SUT SILK 2 0 TIES 10X30 (SUTURE) ×1 IMPLANT
SUT SILK 3 0 SH CR/8 (SUTURE) ×1 IMPLANT
SUT SILK 3 0 TIES 10X30 (SUTURE) ×1 IMPLANT
SUTURE BOLSTER RTNT 3/16X1 3/4 (MISCELLANEOUS) IMPLANT
TAPE CLOTH SURG 4X10 WHT LF (GAUZE/BANDAGES/DRESSINGS) IMPLANT
TOWEL GREEN STERILE (TOWEL DISPOSABLE) ×1 IMPLANT
TRAY FOLEY MTR SLVR 16FR STAT (SET/KITS/TRAYS/PACK) IMPLANT
YANKAUER SUCT BULB TIP NO VENT (SUCTIONS) IMPLANT

## 2022-04-24 NOTE — Progress Notes (Signed)
PROGRESS NOTE    [name]  SEG:[id number] DOB: 05/27/1966 DOA: 04/17/2022 PCP: [name], MD    Brief Narrative:  56 y.o. female with medical history significant of  Schizo affective d/o , COPD, Anemia, GERD, HTN, gastric bypass in 2017 by Dr. Hillary Bow recent interim history of  seizure s/p fall diagnosed 8/23.  In addition to history of  admission 7/23-8/23 for suicide attempt complicated by aspiration pneumonia with acute respiratory failure that was vent dependent. Had trach placed during that stay.  During this admission patient also developed colonic ileus and was seen by GI and placed on Linzess and lactulose.She was recently admitted on on 10/13-10/21 for SBO and was treated conservatively with small bowel protocol. She had persistent obstruction and left AMA on 10/21.  Went to the OR on 10/27.     Assessment and Plan:  Small bowel obstruction/sp laparotomy/lysis of adhesions -General surgery following- + flatus, tolerating clear diet -pain control per GS- off PCA 11/2-- appears to have dehisced her wound   Hypokalemia -replete   Polysubstance abuse ([place]) +cocaine and alcohol abuse -UDS never done   Schizoaffective disorder, bipolar type ([place]) History of suicide attempt in 12/2021 Has not been taking medication per family No SI/HI.  -Psychiatry was consulted, patient started on Haldol 2 mg p.o./IM every 8 hours as needed for agitation -delirium precautions-- do not think patient has capacity to currently make decisions     Type 2 diabetes mellitus (HCC) A1C of 6.5 this month Hold metformin -SSI   Essential hypertension -adjust meds as able   COPD (chronic obstructive pulmonary disease) (HCC) No s/s of exacerbation Continue SABA prn and symbicort    Reported Seizure ([place]) Seizure precautions On no medication,  ? Alcohol withdrawal  Not on vimpat and has not been on this. Verified with family    GERD (gastroesophageal reflux disease) -PPI    Memory loss ? Anoxic brain injury -will place referral to neurology up on d/c   DVT prophylaxis: SCD's Start: 04/24/22 1120 Place and maintain sequential compression device Start: 04/24/22 0755 SCDs Start: 04/17/22 1618    Code Status: Full Code Family Communication:   Disposition Plan:  Level of care: Telemetry Medical Status is: Inpatient Remains inpatient appropriate because: needs to go back to the OR    Consultants:  Psych GS    Subjective: + flatus  Objective: Vitals:   04/23/22 2331 04/24/22 0335 04/24/22 0808 04/24/22 1113  BP: (!) 154/95 136/83 (!) 163/98 (!) 149/93  Pulse: (!) 111 94 (!) 105 96  Resp: 20 15 (!) 21 18  Temp: 98.1 F (36.7 C) 98.1 F (36.7 C) 97.8 F (36.6 C) 98.1 F (36.7 C)  TempSrc: Oral Oral Oral Oral  SpO2: 91% 94% 91% 93%  Weight:    49.9 kg  Height:    '5\' 2"'$  (1.575 m)    Intake/Output Summary (Last 24 hours) at 04/24/2022 1235 Last data filed at 04/24/2022 0400 Gross per 24 hour  Intake 2682.09 ml  Output 1200 ml  Net 1482.09 ml   Filed Weights   04/17/22 0937 04/24/22 1113  Weight: 49.4 kg 49.9 kg    Examination:   General: Appearance:    Thin female in no acute distress   Wound open and oozing  Lungs:     respirations unlabored  Heart:    Normal heart rate.    MS:   All extremities are intact.    Neurologic:   Awake, alert  Data Reviewed: I have personally reviewed following labs and imaging studies  CBC: Recent Labs  Lab 04/21/22 0354 04/23/22 0548 04/23/22 2116 04/23/22 2350 04/24/22 0421  WBC 7.6 7.9 12.2* 11.2* 10.2  HGB 11.7* 12.7 12.8 12.8 12.3  HCT 35.7* 38.2 38.5 38.2 36.4  MCV 90.6 90.1 88.3 88.0 88.1  PLT 252 241 250 264 294   Basic Metabolic Panel: Recent Labs  Lab 04/19/22 0324 04/20/22 0320 04/23/22 0548 04/23/22 2350 04/24/22 0421  NA 136 138 139 138 138  K 4.2 3.6 3.9 3.8 3.5  CL 96* 99 102 100 99  CO2 32 '31 30 28 30  '$ GLUCOSE 133* 83 118* 136* 116*  BUN <5* <5* <5*  <5* <5*  CREATININE 0.62 0.40* 0.40* 0.46 0.42*  CALCIUM 8.2* 8.1* 8.4* 8.5* 8.4*  MG  --   --   --  1.7  --   PHOS  --   --   --  3.8  --    GFR: Estimated Creatinine Clearance: 61.1 mL/min (A) (by C-G formula based on SCr of 0.42 mg/dL (L)). Liver Function Tests: Recent Labs  Lab 04/19/22 0324 04/20/22 0320 04/23/22 2350  AST 19 14* 12*  ALT '11 11 12  '$ ALKPHOS 104 81 82  BILITOT 0.8 0.5 0.2*  PROT 6.3* 5.0* 6.3*  ALBUMIN 2.6* 2.2* 2.5*   No results for input(s): "LIPASE", "AMYLASE" in the last 168 hours.  No results for input(s): "AMMONIA" in the last 168 hours. Coagulation Profile: Recent Labs  Lab 04/17/22 1828  INR 1.1   Cardiac Enzymes: No results for input(s): "CKTOTAL", "CKMB", "CKMBINDEX", "TROPONINI" in the last 168 hours. BNP (last 3 results) No results for input(s): "PROBNP" in the last 8760 hours. HbA1C: No results for input(s): "HGBA1C" in the last 72 hours. CBG: Recent Labs  Lab 04/23/22 0809 04/23/22 1104 04/23/22 1617 04/24/22 0801 04/24/22 1116  GLUCAP 134* 216* 192* 144* 127*   Lipid Profile: No results for input(s): "CHOL", "HDL", "LDLCALC", "TRIG", "CHOLHDL", "LDLDIRECT" in the last 72 hours. Thyroid Function Tests: No results for input(s): "TSH", "T4TOTAL", "FREET4", "T3FREE", "THYROIDAB" in the last 72 hours. Anemia Panel: No results for input(s): "VITAMINB12", "FOLATE", "FERRITIN", "TIBC", "IRON", "RETICCTPCT" in the last 72 hours. Sepsis Labs: Recent Labs  Lab 04/17/22 1258 04/17/22 1828 04/18/22 0052  LATICACIDVEN 2.0* 1.8 1.2    No results found for this or any previous visit (from the past 240 hour(s)).       Radiology Studies: No results found.      Scheduled Meds:  Chlorhexidine Gluconate Cloth  6 each Topical Once   And   Chlorhexidine Gluconate Cloth  6 each Topical Once   [MAR Hold] docusate sodium  100 mg Oral BID   [MAR Hold] folic acid  1 mg Oral Daily   [MAR Hold] haloperidol  2 mg Oral TID   Or    [MAR Hold] haloperidol lactate  2 mg Intramuscular TID   [MAR Hold] insulin aspart  0-9 Units Subcutaneous TID WC   [MAR Hold] nicotine  21 mg Transdermal Daily   [MAR Hold] pantoprazole  40 mg Oral Daily   [MAR Hold] thiamine  100 mg Oral Daily   Or   [MAR Hold] thiamine  100 mg Intravenous Daily   Continuous Infusions:  cefoTEtan (CEFOTAN) IV     lactated ringers     lactated ringers 10 mL/hr at 04/24/22 1143   sodium chloride 0.9 % with cefoTEtan (CEFOTAN) ADS Med  LOS: 7 days    Time spent: 45 minutes spent on chart review, discussion with nursing staff, consultants, updating family and interview/physical exam; more than 50% of that time was spent in counseling and/or coordination of care.    Geradine Girt, DO [facility] Available via Epic secure chat 7am-7pm After these hours, please refer to coverage provider listed on [email or web address] 04/24/2022, 12:35 PM

## 2022-04-24 NOTE — Op Note (Signed)
  04/24/2022  1:49 PM  PATIENT:  [name]  56 y.o. female  PRE-OPERATIVE DIAGNOSIS:  WOUND DEHISENCE  POST-OPERATIVE DIAGNOSIS:  WOUND DEHISENCE  PROCEDURE:  Procedure(s): EXPLORATORY LAPAROTOMY ABDOMINAL CLOSURE INCLUDING RETENTION SUTURES  SURGEON:  Surgeon(s): Georganna Skeans, MD  ASSISTANTS: none   ANESTHESIA:   general  EBL:  Total I/O In: 700 [I.V.:600; IV Piggyback:100] Out: 600 [Urine:550; Blood:50]  BLOOD ADMINISTERED:none  DRAINS: none   SPECIMEN:  No Specimen  DISPOSITION OF SPECIMEN:  N/A  COUNTS:  YES  DICTATION: .Dragon Dictation Findings: Bowel viable, anastomosis viable, no intra-abdominal abscess or other complicating features  Procedure in detail: Informed consent was obtained.  She was taken the operating room emergently.  She received intravenous antibiotics.  General anesthesia was administered by the anesthesia staff.  Her abdomen was prepped and draped in a sterile fashion.  We did a timeout procedure.  I gently remove the staples from her skin carefully protecting the bowel that had dehisced underneath the skin level.  Once the staples were all removed the bowel was gently explored.  It was teased away from the anterior abdominal wall.  It was intact and viable.  I then explored the abdomen in its entirety.  I ran the bowel from the ligament of Treitz to the terminal ileum.  The distal small bowel anastomosis was viable without any evidence of leak or other complications.  The small bowel was viable and in its entirety.  The colon was unremarkable.  The abdomen was irrigated.  I then replaced the bowel in anatomic orientation and I brought her small amount of omentum over the top of it.  I then closed the abdomen with a series of interrupted #1 Novafil figure-of-eight sutures and I also placed 4 #2 nylon retention sutures.  I used silicone wound bridges for the retention sutures.  All counts were correct.  A sterile wet-to-dry dressing was placed.   She tolerated the procedure without apparent complication and was taken recovery in stable condition. PATIENT DISPOSITION:  PACU - hemodynamically stable.   Delay start of Pharmacological VTE agent (>24hrs) due to surgical blood loss or risk of bleeding:  no  Georganna Skeans, MD, MPH, FACS Pager: [phone]  11/2/20231:49 PM

## 2022-04-24 NOTE — Anesthesia Postprocedure Evaluation (Signed)
Anesthesia Post Note  Patient: [name]  Procedure(s) Performed: BRING BACK EXPLORATORY LAPAROTOMY (Abdomen)     Patient location during evaluation: PACU Anesthesia Type: General Level of consciousness: awake and alert, patient cooperative and oriented Pain management: pain level controlled Vital Signs Assessment: post-procedure vital signs reviewed and stable Respiratory status: spontaneous breathing, nonlabored ventilation, respiratory function stable and patient connected to nasal cannula oxygen Cardiovascular status: blood pressure returned to baseline and stable Postop Assessment: no apparent nausea or vomiting Anesthetic complications: no   No notable events documented.  Last Vitals:  Vitals:   04/24/22 1415 04/24/22 1500  BP: (!) 183/99 (!) 175/103  Pulse: (!) 104 (!) 111  Resp: 20 12  Temp:  36.7 C  SpO2: 97% 96%    Last Pain:  Vitals:   04/24/22 1500  TempSrc:   PainSc: 3                  Nao Linz,E. Shealee Yordy

## 2022-04-24 NOTE — Anesthesia Preprocedure Evaluation (Addendum)
Anesthesia Evaluation  Patient identified by MRN, date of birth, ID band Patient awake    Reviewed: Allergy & Precautions, NPO status , Patient's Chart, lab work & pertinent test results  History of Anesthesia Complications Negative for: history of anesthetic complications  Airway Mallampati: II  TM Distance: >3 FB Neck ROM: Full    Dental  (+) Poor Dentition, Chipped, Missing, Dental Advisory Given   Pulmonary COPD,  COPD inhaler, Current Smoker and Patient abstained from smoking.   breath sounds clear to auscultation       Cardiovascular hypertension, Pt. on medications (-) angina  Rhythm:Regular Rate:Normal     Neuro/Psych  Headaches, Seizures -,   Anxiety Depression Bipolar Disorder Schizophrenia     GI/Hepatic ,GERD  Medicated and Controlled,,(+)     substance abuse (pt says none in 2 weeks)  cocaine useS/p gastric bypass Recent ex lap for small bowel stricture: now dehisced   Endo/Other  diabetes (glu 127), Insulin DependentHypothyroidism    Renal/GU negative Renal ROS     Musculoskeletal  (+)  narcotic dependent  Abdominal   Peds  Hematology negative hematology ROS (+)   Anesthesia Other Findings   Reproductive/Obstetrics                             Anesthesia Physical Anesthesia Plan  ASA: 3 and emergent  Anesthesia Plan: General   Post-op Pain Management: Dilaudid IV   Induction: Intravenous and Rapid sequence  PONV Risk Score and Plan: 2 and Ondansetron and Dexamethasone  Airway Management Planned: Oral ETT  Additional Equipment: None  Intra-op Plan:   Post-operative Plan: Extubation in OR  Informed Consent: I have reviewed the patients History and Physical, chart, labs and discussed the procedure including the risks, benefits and alternatives for the proposed anesthesia with the patient or authorized representative who has indicated his/her understanding and  acceptance.     Dental advisory given and Only emergency history available  Plan Discussed with: CRNA and Surgeon  Anesthesia Plan Comments:         Anesthesia Quick Evaluation

## 2022-04-24 NOTE — Transfer of Care (Signed)
Immediate Anesthesia Transfer of Care Note  Patient: [name]  Procedure(s) Performed: BRING BACK EXPLORATORY LAPAROTOMY (Abdomen)  Patient Location: PACU  Anesthesia Type:General  Level of Consciousness: awake and alert   Airway & Oxygen Therapy: Patient Spontanous Breathing  Post-op Assessment: Report given to RN and Post -op Vital signs reviewed and stable  Post vital signs: Reviewed and stable  Last Vitals:  Vitals Value Taken Time  BP 152/81 04/24/22 1400  Temp    Pulse 102 04/24/22 1404  Resp 21 04/24/22 1404  SpO2 95 % 04/24/22 1404  Vitals shown include unvalidated device data.  Last Pain:  Vitals:   04/24/22 1140  TempSrc:   PainSc: 10-Worst pain ever      Patients Stated Pain Goal: 3 (79/39/68 [id number])  Complications: No notable events documented.

## 2022-04-24 NOTE — Anesthesia Procedure Notes (Signed)
Procedure Name: Intubation Date/Time: 04/24/2022 12:50 PM  Performed by: Erick Colace, CRNAPre-anesthesia Checklist: Patient identified, Emergency Drugs available, Suction available and Patient being monitored Patient Re-evaluated:Patient Re-evaluated prior to induction Oxygen Delivery Method: Circle system utilized Preoxygenation: Pre-oxygenation with 100% oxygen Induction Type: IV induction, Rapid sequence and Cricoid Pressure applied Ventilation: Mask ventilation without difficulty Laryngoscope Size: Mac and 3 Grade View: Grade I Tube type: Oral Number of attempts: 1 Airway Equipment and Method: Stylet Placement Confirmation: ETT inserted through vocal cords under direct vision, positive ETCO2 and breath sounds checked- equal and bilateral Secured at: 21 cm Tube secured with: Tape Dental Injury: Teeth and Oropharynx as per pre-operative assessment

## 2022-04-24 NOTE — Progress Notes (Signed)
[facility] Progress Note  6 Days Post-Op  Subjective: CC-  Had significant drainage from wound overnight and abdominal pain is worse this morning. She has been coughing. Ate breakfast and drank liquids without nausea/emesis.   Objective: Vital signs in last 24 hours: Temp:  [97.8 F (36.6 C)-98.5 F (36.9 C)] 97.8 F (36.6 C) (11/02 0808) Pulse Rate:  [94-125] 105 (11/02 0808) Resp:  [15-25] 21 (11/02 0808) BP: (135-163)/(83-100) 163/98 (11/02 0808) SpO2:  [91 %-97 %] 91 % (11/02 0808) Last BM Date :  (PTA)  Intake/Output from previous day: 11/01 0701 - 11/02 0700 In: 2682.1 [P.O.:240; I.V.:2442.1] Out: 1200 [Urine:1200] Intake/Output this shift: No intake/output data recorded.  PE: General: Alert, NAD Lungs: Respiratory effort nonlabored on room air Abd: soft, nondistended, +BS. Midline incision with staples intact but skin edges separated and bowel visible with serosanguinous drainage  Lab Results:  Recent Labs    04/23/22 2350 04/24/22 0421  WBC 11.2* 10.2  HGB 12.8 12.3  HCT 38.2 36.4  PLT 264 240    BMET Recent Labs    04/23/22 2350 04/24/22 0421  NA 138 138  K 3.8 3.5  CL 100 99  CO2 28 30  GLUCOSE 136* 116*  BUN <5* <5*  CREATININE 0.46 0.42*  CALCIUM 8.5* 8.4*    PT/INR No results for input(s): "LABPROT", "INR" in the last 72 hours. CMP     Component Value Date/Time   NA 138 04/24/2022 0421   K 3.5 04/24/2022 0421   CL 99 04/24/2022 0421   CO2 30 04/24/2022 0421   GLUCOSE 116 (H) 04/24/2022 0421   BUN <5 (L) 04/24/2022 0421   CREATININE 0.42 (L) 04/24/2022 0421   CREATININE 0.57 03/28/2021 0000   CALCIUM 8.4 (L) 04/24/2022 0421   PROT 6.3 (L) 04/23/2022 2350   ALBUMIN 2.5 (L) 04/23/2022 2350   AST 12 (L) 04/23/2022 2350   ALT 12 04/23/2022 2350   ALKPHOS 82 04/23/2022 2350   BILITOT 0.2 (L) 04/23/2022 2350   GFRNONAA >60 04/24/2022 0421   GFRAA >60 08/03/2018 1514   Lipase     Component Value Date/Time   LIPASE  40 04/17/2022 1018       Studies/Results: No results found.  Anti-infectives: Anti-infectives (From admission, onward)    Start     Dose/Rate Route Frequency Ordered Stop   04/18/22 1100  cefoTEtan (CEFOTAN) 2 g in sodium chloride 0.9 % 100 mL IVPB        2 g 200 mL/hr over 30 Minutes Intravenous On call to O.R. 04/18/22 1053 04/19/22 0840   04/18/22 1059  sodium chloride 0.9 % with cefoTEtan (CEFOTAN) ADS Med       Note to Pharmacy: Harden Mo E: cabinet override      04/18/22 1059 04/18/22 1243        Assessment/Plan SBO H/o gastric bypass 2017 Dr. Excell Seltzer, history of laparoscopic hysterectomy - CT 10/13 w/ SBO and TP poster pelvis. Mild twisting of mesentery within RLQ. Small HH -Admitted to hospitalist service 10/13 through 10/20 for SBO managed conservatively.  NG tube removed during admission but obstruction did not completely resolve.  She left AMA 10/20 after recommendation for continued n.p.o. status and NG tube replacement -CT 10/24 and 10/26 done without oral contrast showing diffuse small bowel distention concerning for ileus versus SBO however favor pSBO given history  POD#6 s/p exploratory laparotomy/ small bowel resection/ lysis of adhesions 04/18/22 - Dr. Brantley Stage -Intraop findings of Small bowel stricture -Surgical path without  IBD or neoplasm. Fibrous adhesion and benign mesothelial cell proliferation - tolerated soft diet. Last ate 0930.  - wound dehiscence noted today and will go back to OR for ex lap. I called and discussed this with daughter, [name]   FEN: NPO for OR ID: cefotetan preop VTE: lovenox    Alcohol use Tobacco use DM GERD COPD Afib  HTN    LOS: 7 days    Winferd [name], [facility] 04/24/2022, 10:05 AM Please see Amion for pager number during day hours 7:00am-4:30pm

## 2022-04-25 ENCOUNTER — Encounter (HOSPITAL_COMMUNITY): Payer: Self-pay | Admitting: General Surgery

## 2022-04-25 DIAGNOSIS — K56609 Unspecified intestinal obstruction, unspecified as to partial versus complete obstruction: Secondary | ICD-10-CM | POA: Diagnosis not present

## 2022-04-25 LAB — CBC
HCT: 40.4 % (ref 36.0–46.0)
Hemoglobin: 13.6 g/dL (ref 12.0–15.0)
MCH: 30 pg (ref 26.0–34.0)
MCHC: 33.7 g/dL (ref 30.0–36.0)
MCV: 89.2 fL (ref 80.0–100.0)
Platelets: 232 10*3/uL (ref 150–400)
RBC: 4.53 MIL/uL (ref 3.87–5.11)
RDW: 13.5 % (ref 11.5–15.5)
WBC: 11 10*3/uL — ABNORMAL HIGH (ref 4.0–10.5)
nRBC: 0 % (ref 0.0–0.2)

## 2022-04-25 LAB — BASIC METABOLIC PANEL
Anion gap: 12 (ref 5–15)
BUN: <5 mg/dL — ABNORMAL LOW (ref 6–20)
CO2: 28 mmol/L (ref 22–32)
Calcium: 8.1 mg/dL — ABNORMAL LOW (ref 8.9–10.3)
Chloride: 95 mmol/L — ABNORMAL LOW (ref 98–111)
Creatinine, Ser: 0.42 mg/dL — ABNORMAL LOW (ref 0.44–1.00)
GFR, Estimated: >60 mL/min (ref >60)
Glucose, Bld: 127 mg/dL — ABNORMAL HIGH (ref 70–99)
Potassium: 3.7 mmol/L (ref 3.5–5.1)
Sodium: 135 mmol/L (ref 135–145)

## 2022-04-25 LAB — GLUCOSE, CAPILLARY
Glucose-Capillary: 123 mg/dL — ABNORMAL HIGH (ref 70–99)
Glucose-Capillary: 161 mg/dL — ABNORMAL HIGH (ref 70–99)
Glucose-Capillary: 171 mg/dL — ABNORMAL HIGH (ref 70–99)
Glucose-Capillary: 111 mg/dL — ABNORMAL HIGH (ref 70–99)

## 2022-04-25 MED ORDER — ACETAMINOPHEN 500 MG PO TABS
1000.0000 mg | ORAL_TABLET | Freq: Four times a day (QID) | ORAL | Status: DC
  Administered 2022-04-25 – 2022-04-29 (×14): 1000 mg via ORAL
  Filled 2022-04-25 (×12): qty 2

## 2022-04-25 MED ORDER — METHOCARBAMOL 500 MG PO TABS
500.0000 mg | ORAL_TABLET | Freq: Four times a day (QID) | ORAL | Status: DC
  Administered 2022-04-25 (×3): 500 mg via ORAL
  Filled 2022-04-25 (×3): qty 1

## 2022-04-25 NOTE — Progress Notes (Signed)
Physical Therapy Treatment Patient Details Name: [name] MRN: [id number] DOB: Aug 07, 1965 Today's Date: 04/25/2022   History of Present Illness 56 y.o. female presents to [facility] hospital on 04/17/2022 with abdominal pain. Pt recently admitted 10/13-10/21 with SBO, left AMA on 10/21. Imaging found continued SBO. Pt underwent ex-lap with small bowel resection on 10/27. PMH includes afib, COPD, DM, neuropathy, HTN, bipolar disorder, schizophrenia, anxiety, depression, ETOH abuse, seizure.    PT Comments    Pt tolerates treatment well, ambulating for household distances. Drainage on dressing remains unchanged pre mobility to post mobility, with no new areas noted. PT continues to provide education on log roll technique to protect abdomen. PT continues to recommend frequent mobilization during admission.   Recommendations for follow up therapy are one component of a multi-disciplinary discharge planning process, led by the attending physician.  Recommendations may be updated based on patient status, additional functional criteria and insurance authorization.  Follow Up Recommendations  No PT follow up     Assistance Recommended at Discharge PRN  Patient can return home with the following A little help with bathing/dressing/bathroom;Assistance with cooking/housework;Help with stairs or ramp for entrance;Assist for transportation   Equipment Recommendations  None recommended by PT (pt reports owning a RW)    Recommendations for Other Services       Precautions / Restrictions Precautions Precautions: Other (comment) Precaution Comments: abdominal binder, incision protection Restrictions Weight Bearing Restrictions: No     Mobility  Bed Mobility Overal bed mobility: Needs Assistance Bed Mobility: Rolling, Sidelying to Sit, Sit to Sidelying Rolling: Supervision Sidelying to sit: Supervision     Sit to sidelying: Supervision General bed mobility comments: verbal cues for log roll  technique    Transfers Overall transfer level: Needs assistance Equipment used: Rolling walker (2 wheels) Transfers: Sit to/from Stand Sit to Stand: Supervision                Ambulation/Gait Ambulation/Gait assistance: Supervision Gait Distance (Feet): 300 Feet Assistive device: Rolling walker (2 wheels) Gait Pattern/deviations: Step-through pattern Gait velocity: functional Gait velocity interpretation: 1.31 - 2.62 ft/sec, indicative of limited community ambulator   General Gait Details: slowed step-through gait, no significant balance deviations noted   Stairs             Wheelchair Mobility    Modified Rankin (Stroke Patients Only)       Balance Overall balance assessment: Needs assistance Sitting-balance support: No upper extremity supported, Feet supported Sitting balance-Leahy Scale: Good     Standing balance support: Single extremity supported, Reliant on assistive device for balance Standing balance-Leahy Scale: Poor                              Cognition Arousal/Alertness: Awake/alert Behavior During Therapy: WFL for tasks assessed/performed Overall Cognitive Status: Impaired/Different from baseline Area of Impairment: Problem solving                             Problem Solving: Slow processing General Comments: slowed processing, pt whispering throughout session, appears internally distracted at times        Exercises      General Comments General comments (skin integrity, edema, etc.): VSS on RA, abdominal binder adjusted, dressing intact with no change in drainage noted pre and post mobility      Pertinent Vitals/Pain Pain Assessment Pain Assessment: 0-10 Pain Score: 7  Pain Location: abdomen Pain  Descriptors / Indicators: Sore Pain Intervention(s): Premedicated before session    Home Living                          Prior Function            PT Goals (current goals can now be found in  the care plan section) Acute Rehab PT Goals Patient Stated Goal: to go home, return to independence Progress towards PT goals: Progressing toward goals    Frequency    Min 3X/week      PT Plan Current plan remains appropriate    Co-evaluation              AM-PAC PT "6 Clicks" Mobility   Outcome Measure  Help needed turning from your back to your side while in a flat bed without using bedrails?: A Little Help needed moving from lying on your back to sitting on the side of a flat bed without using bedrails?: A Little Help needed moving to and from a bed to a chair (including a wheelchair)?: A Little Help needed standing up from a chair using your arms (e.g., wheelchair or bedside chair)?: A Little Help needed to walk in hospital room?: A Little Help needed climbing 3-5 steps with a railing? : A Little 6 Click Score: 18    End of Session   Activity Tolerance: Patient tolerated treatment well Patient left: in bed;with call bell/phone within reach;with bed alarm set Nurse Communication: Mobility status PT Visit Diagnosis: Other abnormalities of gait and mobility (R26.89)     Time: 2947-6546 PT Time Calculation (min) (ACUTE ONLY): 23 min  Charges:  $Gait Training: 8-22 mins $Therapeutic Activity: 8-22 mins                     Zenaida Niece, PT, DPT Acute Rehabilitation Office [phone]    Zenaida Niece 04/25/2022, 9:45 AM

## 2022-04-25 NOTE — Progress Notes (Signed)
[facility] Progress Note  1 Day Post-Op  Subjective: CC-  Having worse abdominal pain after OR yesterday. Drinking clears without nausea/emesis/worsening abdominal pain. Passing flatus. No BM. Has not been out of bed yet today.  Objective: Vital signs in last 24 hours: Temp:  [98 F (36.7 C)-98.5 F (36.9 C)] 98.5 F (36.9 C) (11/03 0750) Pulse Rate:  [96-112] 99 (11/03 0750) Resp:  [12-22] 14 (11/03 0750) BP: (130-183)/(77-103) 130/77 (11/03 0750) SpO2:  [92 %-97 %] 93 % (11/03 0750) Weight:  [49.9 kg] 49.9 kg (11/02 1113) Last BM Date :  (PTA)  Intake/Output from previous day: 11/02 0701 - 11/03 0700 In: 1280 [P.O.:580; I.V.:600; IV Piggyback:100] Out: 2150 [Urine:2100; Blood:50] Intake/Output this shift: Total I/O In: 340 [P.O.:340] Out: -   PE: General: Alert, NAD Lungs: Respiratory effort nonlabored on room air Abd: soft, nondistended, +BS. Midline incision with retention sutures intact and wet to dry gauze in place in between. Dressing change by me this am - wound intact and clean with appropriate bleeding and no purulent drainage. No surrounding erythema. Binder replaced  Lab Results:  Recent Labs    04/24/22 0421 04/25/22 0800  WBC 10.2 11.0*  HGB 12.3 13.6  HCT 36.4 40.4  PLT 240 232    BMET Recent Labs    04/24/22 0421 04/25/22 0800  NA 138 135  K 3.5 3.7  CL 99 95*  CO2 30 28  GLUCOSE 116* 127*  BUN <5* <5*  CREATININE 0.42* 0.42*  CALCIUM 8.4* 8.1*    PT/INR No results for input(s): "LABPROT", "INR" in the last 72 hours. CMP     Component Value Date/Time   NA 135 04/25/2022 0800   K 3.7 04/25/2022 0800   CL 95 (L) 04/25/2022 0800   CO2 28 04/25/2022 0800   GLUCOSE 127 (H) 04/25/2022 0800   BUN <5 (L) 04/25/2022 0800   CREATININE 0.42 (L) 04/25/2022 0800   CREATININE 0.57 03/28/2021 0000   CALCIUM 8.1 (L) 04/25/2022 0800   PROT 6.3 (L) 04/23/2022 2350   ALBUMIN 2.5 (L) 04/23/2022 2350   AST 12 (L) 04/23/2022 2350    ALT 12 04/23/2022 2350   ALKPHOS 82 04/23/2022 2350   BILITOT 0.2 (L) 04/23/2022 2350   GFRNONAA >60 04/25/2022 0800   GFRAA >60 08/03/2018 1514   Lipase     Component Value Date/Time   LIPASE 40 04/17/2022 1018       Studies/Results: No results found.  Anti-infectives: Anti-infectives (From admission, onward)    Start     Dose/Rate Route Frequency Ordered Stop   04/24/22 1130  cefoTEtan (CEFOTAN) 2 g in sodium chloride 0.9 % 100 mL IVPB        2 g 200 mL/hr over 30 Minutes Intravenous On call to O.R. 04/24/22 1119 04/24/22 1322   04/24/22 1120  sodium chloride 0.9 % with cefoTEtan (CEFOTAN) ADS Med       Note to Pharmacy: Mendel Corning B: cabinet override      04/24/22 1120 04/24/22 1301   04/18/22 1100  cefoTEtan (CEFOTAN) 2 g in sodium chloride 0.9 % 100 mL IVPB        2 g 200 mL/hr over 30 Minutes Intravenous On call to O.R. 04/18/22 1053 04/19/22 0840   04/18/22 1059  sodium chloride 0.9 % with cefoTEtan (CEFOTAN) ADS Med       Note to Pharmacy: Harden Mo E: cabinet override      04/18/22 1059 04/18/22 1243  Assessment/Plan SBO H/o gastric bypass 2017 Dr. Excell Seltzer, history of laparoscopic hysterectomy - CT 10/13 w/ SBO and TP poster pelvis. Mild twisting of mesentery within RLQ. Small HH -Admitted to hospitalist service 10/13 through 10/20 for SBO managed conservatively.  NG tube removed during admission but obstruction did not completely resolve.  She left AMA 10/20 after recommendation for continued n.p.o. status and NG tube replacement -CT 10/24 and 10/26 done without oral contrast showing diffuse small bowel distention concerning for ileus versus SBO however favor pSBO given history  POD#7 s/p exploratory laparotomy/ small bowel resection/ lysis of adhesions 04/18/22 - Dr. Brantley Stage -Intraop findings of Small bowel stricture POD#1 s/p ex lap abdominal closure w/ retention sutures for wound dehiscence 04/24/22 Dr. Grandville Silos  -Surgical path without IBD or  neoplasm. Fibrous adhesion and benign mesothelial cell proliferation - tolerating CLD. Advance to fulls. - continue abdominal binder - bid wet to dry dressing changes around sutures - multimodal pain control - added scheduled tylenol and robaxin today - ambulate   FEN: FLD. IVF per primary  ID: cefotetan 11/2 VTE: lovenox    Alcohol use Tobacco use DM GERD COPD Afib  HTN    LOS: 8 days    Winferd [name], [facility] Surgery 04/25/2022, 10:10 AM Please see Amion for pager number during day hours 7:00am-4:30pm

## 2022-04-25 NOTE — Plan of Care (Signed)
  Problem: Health Behavior/Discharge Planning: Goal: Ability to manage health-related needs will improve Outcome: Progressing   Problem: Clinical Measurements: Goal: Ability to maintain clinical measurements within normal limits will improve Outcome: Progressing Goal: Will remain free from infection Outcome: Progressing Goal: Diagnostic test results will improve Outcome: Progressing Goal: Respiratory complications will improve Outcome: Progressing Goal: Cardiovascular complication will be avoided Outcome: Progressing   Problem: Activity: Goal: Risk for activity intolerance will decrease Outcome: Progressing   Problem: Nutrition: Goal: Adequate nutrition will be maintained Outcome: Progressing   Problem: Coping: Goal: Level of anxiety will decrease Outcome: Progressing   Problem: Elimination: Goal: Will not experience complications related to bowel motility Outcome: Progressing Goal: Will not experience complications related to urinary retention Outcome: Progressing   Problem: Pain Managment: Goal: General experience of comfort will improve Outcome: Progressing   Problem: Safety: Goal: Ability to remain free from injury will improve Outcome: Progressing   Problem: Skin Integrity: Goal: Risk for impaired skin integrity will decrease Outcome: Progressing   Problem: Education: Goal: Required Educational Video(s) Outcome: Progressing   Problem: Clinical Measurements: Goal: Ability to maintain clinical measurements within normal limits will improve Outcome: Progressing Goal: Postoperative complications will be avoided or minimized Outcome: Progressing   Problem: Skin Integrity: Goal: Demonstration of wound healing without infection will improve Outcome: Progressing   Problem: Education: Goal: Ability to describe self-care measures that may prevent or decrease complications (Diabetes Survival Skills Education) will improve Outcome: Progressing Goal:  Individualized Educational Video(s) Outcome: Progressing   Problem: Coping: Goal: Ability to adjust to condition or change in health will improve Outcome: Progressing   Problem: Fluid Volume: Goal: Ability to maintain a balanced intake and output will improve Outcome: Progressing   Problem: Health Behavior/Discharge Planning: Goal: Ability to identify and utilize available resources and services will improve Outcome: Progressing Goal: Ability to manage health-related needs will improve Outcome: Progressing   Problem: Metabolic: Goal: Ability to maintain appropriate glucose levels will improve Outcome: Progressing   Problem: Nutritional: Goal: Maintenance of adequate nutrition will improve Outcome: Progressing Goal: Progress toward achieving an optimal weight will improve Outcome: Progressing   Problem: Skin Integrity: Goal: Risk for impaired skin integrity will decrease Outcome: Progressing   Problem: Tissue Perfusion: Goal: Adequacy of tissue perfusion will improve Outcome: Progressing

## 2022-04-25 NOTE — Progress Notes (Signed)
Mobility Specialist Progress Note   04/25/22 1640  Mobility  Activity Ambulated with assistance in hallway  Level of Assistance Standby assist, set-up cues, supervision of patient - no hands on  Assistive Device Front wheel walker  Distance Ambulated (ft) 400 ft  Activity Response Tolerated well  $Mobility charge 1 Mobility   Pre Mobility: 107 HR, 132/89 BP, 98% SpO2 During Mobility: 112 HR, 94% SpO2 Post Mobility: 105 HR, BP, 99% SpO2  Received pt in bed c/o discomfort from abdominal binder. Once seated on EOB, adjusted abdominal binder to comfort. Able to ambulate in hall w/ no physical assistance but x1 seated break to get better reading on SpO2 levels. Once able to receive a clear pleth pt remained >/= 93% SpO2. Returned back to bed w/o fault, all needs met and bed alarm on.     Mobility Specialist Acute Rehab Office:  [phone]  

## 2022-04-25 NOTE — Progress Notes (Signed)
PROGRESS NOTE    [name]  MIW:[id number] DOB: 1965-09-18 DOA: 04/17/2022 PCP: [name], MD    Brief Narrative:  56 y.o. female with medical history significant of  Schizo affective d/o , COPD, Anemia, GERD, HTN, gastric bypass in 2017 by Dr. Hillary Bow recent interim history of  seizure s/p fall diagnosed 8/23.  In addition to history of  admission 7/23-8/23 for suicide attempt complicated by aspiration pneumonia with acute respiratory failure that was vent dependent. Had trach placed during that stay.  During this admission patient also developed colonic ileus and was seen by GI and placed on Linzess and lactulose.She was recently admitted on on 10/13-10/21 for SBO and was treated conservatively with small bowel protocol. She had persistent obstruction and left AMA on 10/21.  Went to the OR on 10/27.  Had dehisce on 11/2 and went back to the OR.      Assessment and Plan:  Small bowel obstruction/sp laparotomy/lysis of adhesions -General surgery following- + flatus, tolerating clear diet -pain control per GS- off PCA 11/2-- back to the OR   Hypokalemia -replete   Polysubstance abuse ([place]) +cocaine and alcohol abuse -UDS never done   Schizoaffective disorder, bipolar type ([place]) History of suicide attempt in 12/2021 Has not been taking medication per family No SI/HI.  -Psychiatry was consulted, patient started on Haldol 2 mg p.o./IM every 8 hours as needed for agitation -delirium precautions-- do not think patient has capacity to currently make decisions     Type 2 diabetes mellitus (HCC) A1C of 6.5 this month Hold metformin -SSI   Essential hypertension -adjust meds as able   COPD (chronic obstructive pulmonary disease) (HCC) No s/s of exacerbation Continue SABA prn and symbicort    Reported Seizure ([place]) Seizure precautions On no medication,  ? Alcohol withdrawal  Not on vimpat and has not been on this. Verified with family    GERD (gastroesophageal  reflux disease) -PPI   Memory loss ? Anoxic brain injury -will place referral to neurology up on d/c   DVT prophylaxis: Place and maintain sequential compression device Start: 04/24/22 0755 SCDs Start: 04/17/22 1618    Code Status: Full Code Family Communication:   Disposition Plan:  Level of care: Telemetry Medical Status is: Inpatient Remains inpatient appropriate because: needs to go back to the OR    Consultants:  Psych GS    Subjective: Having some pain  Objective: Vitals:   04/24/22 2325 04/25/22 0349 04/25/22 0750 04/25/22 1127  BP: (!) 147/93 134/84 130/77 (!) 151/87  Pulse: (!) 103 (!) 106 99 99  Resp: 18 (!) '22 14 15  '$ Temp: 98.1 F (36.7 C) 98.5 F (36.9 C) 98.5 F (36.9 C) (!) 97.5 F (36.4 C)  TempSrc: Oral Oral Oral Oral  SpO2: 92% 93% 93% 91%  Weight:      Height:        Intake/Output Summary (Last 24 hours) at 04/25/2022 1138 Last data filed at 04/25/2022 0930 Gross per 24 hour  Intake 1620 ml  Output 2150 ml  Net -530 ml   Filed Weights   04/17/22 0937 04/24/22 1113  Weight: 49.4 kg 49.9 kg    Examination:    General: Appearance:    elderly female in no acute distress     Lungs:     respirations unlabored  Heart:    Normal heart rate.   MS:   All extremities are intact.   Neurologic:   Awake, alert, voice soft  Data Reviewed: I have personally reviewed following labs and imaging studies  CBC: Recent Labs  Lab 04/23/22 0548 04/23/22 2116 04/23/22 2350 04/24/22 0421 04/25/22 0800  WBC 7.9 12.2* 11.2* 10.2 11.0*  HGB 12.7 12.8 12.8 12.3 13.6  HCT 38.2 38.5 38.2 36.4 40.4  MCV 90.1 88.3 88.0 88.1 89.2  PLT 241 250 264 240 240   Basic Metabolic Panel: Recent Labs  Lab 04/20/22 0320 04/23/22 0548 04/23/22 2350 04/24/22 0421 04/25/22 0800  NA 138 139 138 138 135  K 3.6 3.9 3.8 3.5 3.7  CL 99 102 100 99 95*  CO2 '31 30 28 30 28  '$ GLUCOSE 83 118* 136* 116* 127*  BUN <5* <5* <5* <5* <5*  CREATININE 0.40*  0.40* 0.46 0.42* 0.42*  CALCIUM 8.1* 8.4* 8.5* 8.4* 8.1*  MG  --   --  1.7  --   --   PHOS  --   --  3.8  --   --    GFR: Estimated Creatinine Clearance: 61.1 mL/min (A) (by C-G formula based on SCr of 0.42 mg/dL (L)). Liver Function Tests: Recent Labs  Lab 04/19/22 0324 04/20/22 0320 04/23/22 2350  AST 19 14* 12*  ALT '11 11 12  '$ ALKPHOS 104 81 82  BILITOT 0.8 0.5 0.2*  PROT 6.3* 5.0* 6.3*  ALBUMIN 2.6* 2.2* 2.5*   No results for input(s): "LIPASE", "AMYLASE" in the last 168 hours.  No results for input(s): "AMMONIA" in the last 168 hours. Coagulation Profile: No results for input(s): "INR", "PROTIME" in the last 168 hours.  Cardiac Enzymes: No results for input(s): "CKTOTAL", "CKMB", "CKMBINDEX", "TROPONINI" in the last 168 hours. BNP (last 3 results) No results for input(s): "PROBNP" in the last 8760 hours. HbA1C: No results for input(s): "HGBA1C" in the last 72 hours. CBG: Recent Labs  Lab 04/24/22 1403 04/24/22 1604 04/24/22 2112 04/25/22 0756 04/25/22 1125  GLUCAP 122* 195* 152* 123* 161*   Lipid Profile: No results for input(s): "CHOL", "HDL", "LDLCALC", "TRIG", "CHOLHDL", "LDLDIRECT" in the last 72 hours. Thyroid Function Tests: No results for input(s): "TSH", "T4TOTAL", "FREET4", "T3FREE", "THYROIDAB" in the last 72 hours. Anemia Panel: No results for input(s): "VITAMINB12", "FOLATE", "FERRITIN", "TIBC", "IRON", "RETICCTPCT" in the last 72 hours. Sepsis Labs: No results for input(s): "PROCALCITON", "LATICACIDVEN" in the last 168 hours.   No results found for this or any previous visit (from the past 240 hour(s)).       Radiology Studies: No results found.      Scheduled Meds:  docusate sodium  100 mg Oral BID   folic acid  1 mg Oral Daily   haloperidol  2 mg Oral TID   Or   haloperidol lactate  2 mg Intramuscular TID   insulin aspart  0-9 Units Subcutaneous TID WC   nicotine  21 mg Transdermal Daily   pantoprazole  40 mg Oral Daily    thiamine  100 mg Oral Daily   Or   thiamine  100 mg Intravenous Daily   Continuous Infusions:     LOS: 8 days    Time spent: 45 minutes spent on chart review, discussion with nursing staff, consultants, updating family and interview/physical exam; more than 50% of that time was spent in counseling and/or coordination of care.    Geradine Girt, DO [facility] Available via Epic secure chat 7am-7pm After these hours, please refer to coverage provider listed on [email or web address] 04/25/2022, 11:38 AM

## 2022-04-26 DIAGNOSIS — K56609 Unspecified intestinal obstruction, unspecified as to partial versus complete obstruction: Secondary | ICD-10-CM | POA: Diagnosis not present

## 2022-04-26 LAB — BASIC METABOLIC PANEL
Anion gap: 11 (ref 5–15)
BUN: <5 mg/dL — ABNORMAL LOW (ref 6–20)
CO2: 28 mmol/L (ref 22–32)
Calcium: 8.3 mg/dL — ABNORMAL LOW (ref 8.9–10.3)
Chloride: 98 mmol/L (ref 98–111)
Creatinine, Ser: 0.4 mg/dL — ABNORMAL LOW (ref 0.44–1.00)
GFR, Estimated: >60 mL/min (ref >60)
Glucose, Bld: 220 mg/dL — ABNORMAL HIGH (ref 70–99)
Potassium: 3.4 mmol/L — ABNORMAL LOW (ref 3.5–5.1)
Sodium: 137 mmol/L (ref 135–145)

## 2022-04-26 LAB — CBC
HCT: 35.6 % — ABNORMAL LOW (ref 36.0–46.0)
Hemoglobin: 11.8 g/dL — ABNORMAL LOW (ref 12.0–15.0)
MCH: 29.1 pg (ref 26.0–34.0)
MCHC: 33.1 g/dL (ref 30.0–36.0)
MCV: 87.9 fL (ref 80.0–100.0)
Platelets: 245 10*3/uL (ref 150–400)
RBC: 4.05 MIL/uL (ref 3.87–5.11)
RDW: 13.5 % (ref 11.5–15.5)
WBC: 9.4 10*3/uL (ref 4.0–10.5)
nRBC: 0 % (ref 0.0–0.2)

## 2022-04-26 LAB — GLUCOSE, CAPILLARY
Glucose-Capillary: 109 mg/dL — ABNORMAL HIGH (ref 70–99)
Glucose-Capillary: 152 mg/dL — ABNORMAL HIGH (ref 70–99)
Glucose-Capillary: 129 mg/dL — ABNORMAL HIGH (ref 70–99)

## 2022-04-26 LAB — MAGNESIUM: Magnesium: 1.7 mg/dL (ref 1.7–2.4)

## 2022-04-26 MED ORDER — GUAIFENESIN ER 600 MG PO TB12
600.0000 mg | ORAL_TABLET | Freq: Two times a day (BID) | ORAL | Status: DC
  Administered 2022-04-26 – 2022-04-29 (×7): 600 mg via ORAL
  Filled 2022-04-26 (×7): qty 1

## 2022-04-26 MED ORDER — POLYETHYLENE GLYCOL 3350 17 G PO PACK
17.0000 g | PACK | Freq: Every day | ORAL | Status: DC | PRN
  Administered 2022-04-26 – 2022-04-27 (×2): 17 g via ORAL
  Filled 2022-04-26: qty 1

## 2022-04-26 MED ORDER — POTASSIUM CHLORIDE 20 MEQ PO PACK
40.0000 meq | PACK | Freq: Two times a day (BID) | ORAL | Status: AC
  Administered 2022-04-26 (×2): 40 meq via ORAL
  Filled 2022-04-26 (×2): qty 2

## 2022-04-26 MED ORDER — HYDROMORPHONE HCL 1 MG/ML IJ SOLN: 0.5000 mg | INTRAMUSCULAR | Status: DC | PRN

## 2022-04-26 MED ORDER — MAGNESIUM SULFATE IN D5W 1-5 GM/100ML-% IV SOLN
1.0000 g | Freq: Once | INTRAVENOUS | Status: AC
  Administered 2022-04-26: 1 g via INTRAVENOUS
  Filled 2022-04-26: qty 100

## 2022-04-26 MED ORDER — METHOCARBAMOL 750 MG PO TABS
750.0000 mg | ORAL_TABLET | Freq: Four times a day (QID) | ORAL | Status: DC
  Administered 2022-04-26 – 2022-04-29 (×13): 750 mg via ORAL
  Filled 2022-04-26 (×14): qty 1

## 2022-04-26 NOTE — Plan of Care (Signed)
  Problem: Health Behavior/Discharge Planning: Goal: Ability to manage health-related needs will improve Outcome: Progressing   Problem: Clinical Measurements: Goal: Ability to maintain clinical measurements within normal limits will improve Outcome: Progressing Goal: Will remain free from infection Outcome: Progressing Goal: Diagnostic test results will improve Outcome: Progressing Goal: Respiratory complications will improve Outcome: Progressing Goal: Cardiovascular complication will be avoided Outcome: Progressing   Problem: Activity: Goal: Risk for activity intolerance will decrease Outcome: Progressing   Problem: Nutrition: Goal: Adequate nutrition will be maintained Outcome: Progressing   Problem: Coping: Goal: Level of anxiety will decrease Outcome: Progressing   Problem: Elimination: Goal: Will not experience complications related to bowel motility Outcome: Progressing Goal: Will not experience complications related to urinary retention Outcome: Progressing   Problem: Pain Managment: Goal: General experience of comfort will improve Outcome: Progressing   Problem: Safety: Goal: Ability to remain free from injury will improve Outcome: Progressing   Problem: Skin Integrity: Goal: Risk for impaired skin integrity will decrease Outcome: Progressing   Problem: Education: Goal: Required Educational Video(s) Outcome: Progressing   Problem: Clinical Measurements: Goal: Ability to maintain clinical measurements within normal limits will improve Outcome: Progressing Goal: Postoperative complications will be avoided or minimized Outcome: Progressing   Problem: Skin Integrity: Goal: Demonstration of wound healing without infection will improve Outcome: Progressing   Problem: Education: Goal: Ability to describe self-care measures that may prevent or decrease complications (Diabetes Survival Skills Education) will improve Outcome: Progressing Goal:  Individualized Educational Video(s) Outcome: Progressing   Problem: Coping: Goal: Ability to adjust to condition or change in health will improve Outcome: Progressing   Problem: Fluid Volume: Goal: Ability to maintain a balanced intake and output will improve Outcome: Progressing   Problem: Health Behavior/Discharge Planning: Goal: Ability to identify and utilize available resources and services will improve Outcome: Progressing Goal: Ability to manage health-related needs will improve Outcome: Progressing   Problem: Metabolic: Goal: Ability to maintain appropriate glucose levels will improve Outcome: Progressing   Problem: Nutritional: Goal: Maintenance of adequate nutrition will improve Outcome: Progressing Goal: Progress toward achieving an optimal weight will improve Outcome: Progressing   Problem: Skin Integrity: Goal: Risk for impaired skin integrity will decrease Outcome: Progressing   Problem: Tissue Perfusion: Goal: Adequacy of tissue perfusion will improve Outcome: Progressing

## 2022-04-26 NOTE — Progress Notes (Signed)
PROGRESS NOTE    [name]  GUY:[id number] DOB: [birth year] DOA: 04/17/2022 PCP: [name], MD    Brief Narrative:  56 y.o. female with medical history significant of  Schizo affective d/o , COPD, Anemia, GERD, HTN, gastric bypass in 2017 by Dr. Hillary Bow recent interim history of  seizure s/p fall diagnosed 8/23.  In addition to history of  admission 7/23-8/23 for suicide attempt complicated by aspiration pneumonia with acute respiratory failure that was vent dependent. Had trach placed during that stay.  During this admission patient also developed colonic ileus and was seen by GI and placed on Linzess and lactulose.She was recently admitted on on 10/13-10/21 for SBO and was treated conservatively with small bowel protocol. She had persistent obstruction and left AMA on 10/21.  Went to the OR on 10/27.  Had dehisce on 11/2 and went back to the OR.      Assessment and Plan:  Small bowel obstruction/sp laparotomy/lysis of adhesions -General surgery following- + flatus, tolerating clear diet -pain control per GS- off PCA 11/2-- back to the OR -passing gas but no BM yet -patient ambulating   Hypokalemia -repleted   Polysubstance abuse ([place]) +cocaine and alcohol abuse -UDS never done   Schizoaffective disorder, bipolar type ([place]) History of suicide attempt in 12/2021 Has not been taking medication per family No SI/HI.  -Psychiatry was consulted, patient started on Haldol 2 mg p.o./IM every 8 hours as needed for agitation   Type 2 diabetes mellitus (HCC) A1C of 6.5 this month Hold metformin -SSI   Essential hypertension -adjust meds as able   COPD (chronic obstructive pulmonary disease) (HCC) No s/s of exacerbation Continue SABA prn and symbicort    Reported Seizure ([place]) Seizure precautions On no medication,  ? Alcohol withdrawal  Not on vimpat and has not been on this. Verified with family    GERD (gastroesophageal reflux disease) -PPI   Memory  loss ? Anoxic brain injury -will place referral to neurology up on d/c   DVT prophylaxis: Place and maintain sequential compression device Start: 04/24/22 0755 SCDs Start: 04/17/22 1618    Code Status: Full Code Family Communication: at bedside  Disposition Plan:  Level of care: Telemetry Medical Status is: Inpatient Remains inpatient appropriate because: needs to go back to the OR    Consultants:  Psych GS    Subjective: Up walking around room  Objective: Vitals:   04/25/22 1920 04/26/22 0400 04/26/22 0700 04/26/22 0850  BP: 125/80 119/79  (!) 150/95  Pulse: (!) 105 (!) 102 90 93  Resp: 18 (!) 22  15  Temp: 97.9 F (36.6 C)   98.1 F (36.7 C)  TempSrc: Oral   Oral  SpO2: 94% 96%  96%  Weight:      Height:        Intake/Output Summary (Last 24 hours) at 04/26/2022 1045 Last data filed at 04/26/2022 0200 Gross per 24 hour  Intake 1340 ml  Output 1300 ml  Net 40 ml   Filed Weights   04/17/22 0937 04/24/22 1113  Weight: 49.4 kg 49.9 kg    Examination:    General: Appearance:    Well developed, well nourished female in no acute distress     Lungs:      respirations unlabored  Heart:    Normal heart rate.   MS:   All extremities are intact. Up waling in room  Neurologic:   Awake, alert       Data Reviewed: I have personally reviewed  following labs and imaging studies  CBC: Recent Labs  Lab 04/23/22 2116 04/23/22 2350 04/24/22 0421 04/25/22 0800 04/26/22 0447  WBC 12.2* 11.2* 10.2 11.0* 9.4  HGB 12.8 12.8 12.3 13.6 11.8*  HCT 38.5 38.2 36.4 40.4 35.6*  MCV 88.3 88.0 88.1 89.2 87.9  PLT 250 264 240 232 062   Basic Metabolic Panel: Recent Labs  Lab 04/23/22 0548 04/23/22 2350 04/24/22 0421 04/25/22 0800 04/26/22 0447  NA 139 138 138 135 137  K 3.9 3.8 3.5 3.7 3.4*  CL 102 100 99 95* 98  CO2 '30 28 30 28 28  '$ GLUCOSE 118* 136* 116* 127* 220*  BUN <5* <5* <5* <5* <5*  CREATININE 0.40* 0.46 0.42* 0.42* 0.40*  CALCIUM 8.4* 8.5* 8.4*  8.1* 8.3*  MG  --  1.7  --   --  1.7  PHOS  --  3.8  --   --   --    GFR: Estimated Creatinine Clearance: 61.1 mL/min (A) (by C-G formula based on SCr of 0.4 mg/dL (L)). Liver Function Tests: Recent Labs  Lab 04/20/22 0320 04/23/22 2350  AST 14* 12*  ALT 11 12  ALKPHOS 81 82  BILITOT 0.5 0.2*  PROT 5.0* 6.3*  ALBUMIN 2.2* 2.5*   No results for input(s): "LIPASE", "AMYLASE" in the last 168 hours.  No results for input(s): "AMMONIA" in the last 168 hours. Coagulation Profile: No results for input(s): "INR", "PROTIME" in the last 168 hours.  Cardiac Enzymes: No results for input(s): "CKTOTAL", "CKMB", "CKMBINDEX", "TROPONINI" in the last 168 hours. BNP (last 3 results) No results for input(s): "PROBNP" in the last 8760 hours. HbA1C: No results for input(s): "HGBA1C" in the last 72 hours. CBG: Recent Labs  Lab 04/25/22 0756 04/25/22 1125 04/25/22 1552 04/25/22 2108 04/26/22 0848  GLUCAP 123* 161* 171* 111* 109*   Lipid Profile: No results for input(s): "CHOL", "HDL", "LDLCALC", "TRIG", "CHOLHDL", "LDLDIRECT" in the last 72 hours. Thyroid Function Tests: No results for input(s): "TSH", "T4TOTAL", "FREET4", "T3FREE", "THYROIDAB" in the last 72 hours. Anemia Panel: No results for input(s): "VITAMINB12", "FOLATE", "FERRITIN", "TIBC", "IRON", "RETICCTPCT" in the last 72 hours. Sepsis Labs: No results for input(s): "PROCALCITON", "LATICACIDVEN" in the last 168 hours.   No results found for this or any previous visit (from the past 240 hour(s)).       Radiology Studies: No results found.      Scheduled Meds:  acetaminophen  1,000 mg Oral Q6H   docusate sodium  100 mg Oral BID   folic acid  1 mg Oral Daily   guaiFENesin  600 mg Oral BID   haloperidol  2 mg Oral TID   Or   haloperidol lactate  2 mg Intramuscular TID   insulin aspart  0-9 Units Subcutaneous TID WC   methocarbamol  750 mg Oral QID   nicotine  21 mg Transdermal Daily   pantoprazole  40 mg  Oral Daily   potassium chloride  40 mEq Oral BID   thiamine  100 mg Oral Daily   Continuous Infusions:  magnesium sulfate bolus IVPB 1 g (04/26/22 1017)      LOS: 9 days    Time spent: 45 minutes spent on chart review, discussion with nursing staff, consultants, updating family and interview/physical exam; more than 50% of that time was spent in counseling and/or coordination of care.    Geradine Girt, DO [facility] Available via Epic secure chat 7am-7pm After these hours, please refer to coverage provider listed on  [email or web address] 04/26/2022, 10:45 AM

## 2022-04-26 NOTE — Progress Notes (Addendum)
[facility] Progress Note  2 Days Post-Op  Subjective: CC-  Having more abdominal pain at incision site since most recent surgery. Taking some dilaudid and oxycodone. Tolerating full liquids. Denies n/v. Passing flatus, no BM. She did ambulate yesterday.  Objective: Vital signs in last 24 hours: Temp:  [97.5 F (36.4 C)-98.1 F (36.7 C)] 98.1 F (36.7 C) (11/04 0850) Pulse Rate:  [90-105] 93 (11/04 0850) Resp:  [14-22] 15 (11/04 0850) BP: (119-151)/(79-95) 150/95 (11/04 0850) SpO2:  [91 %-96 %] 96 % (11/04 0850) Last BM Date :  (PTA)  Intake/Output from previous day: 11/03 0701 - 11/04 0700 In: 1680 [P.O.:1680] Out: 1300 [Urine:1300] Intake/Output this shift: No intake/output data recorded.  PE: General: Alert, NAD Lungs: Respiratory effort nonlabored on room air Abd: soft, mild distension, +BS. Midline incision cdi with retention sutures intact, no drainage or cellulitis. Binder replaced   Lab Results:  Recent Labs    04/25/22 0800 04/26/22 0447  WBC 11.0* 9.4  HGB 13.6 11.8*  HCT 40.4 35.6*  PLT 232 245   BMET Recent Labs    04/25/22 0800 04/26/22 0447  NA 135 137  K 3.7 3.4*  CL 95* 98  CO2 28 28  GLUCOSE 127* 220*  BUN <5* <5*  CREATININE 0.42* 0.40*  CALCIUM 8.1* 8.3*   PT/INR No results for input(s): "LABPROT", "INR" in the last 72 hours. CMP     Component Value Date/Time   NA 137 04/26/2022 0447   K 3.4 (L) 04/26/2022 0447   CL 98 04/26/2022 0447   CO2 28 04/26/2022 0447   GLUCOSE 220 (H) 04/26/2022 0447   BUN <5 (L) 04/26/2022 0447   CREATININE 0.40 (L) 04/26/2022 0447   CREATININE 0.57 03/28/2021 0000   CALCIUM 8.3 (L) 04/26/2022 0447   PROT 6.3 (L) 04/23/2022 2350   ALBUMIN 2.5 (L) 04/23/2022 2350   AST 12 (L) 04/23/2022 2350   ALT 12 04/23/2022 2350   ALKPHOS 82 04/23/2022 2350   BILITOT 0.2 (L) 04/23/2022 2350   GFRNONAA >60 04/26/2022 0447   GFRAA >60 08/03/2018 1514   Lipase     Component Value Date/Time    LIPASE 40 04/17/2022 1018       Studies/Results: No results found.  Anti-infectives: Anti-infectives (From admission, onward)    Start     Dose/Rate Route Frequency Ordered Stop   04/24/22 1130  cefoTEtan (CEFOTAN) 2 g in sodium chloride 0.9 % 100 mL IVPB        2 g 200 mL/hr over 30 Minutes Intravenous On call to O.R. 04/24/22 1119 04/24/22 1322   04/24/22 1120  sodium chloride 0.9 % with cefoTEtan (CEFOTAN) ADS Med       Note to Pharmacy: Mendel Corning B: cabinet override      04/24/22 1120 04/24/22 1301   04/18/22 1100  cefoTEtan (CEFOTAN) 2 g in sodium chloride 0.9 % 100 mL IVPB        2 g 200 mL/hr over 30 Minutes Intravenous On call to O.R. 04/18/22 1053 04/19/22 0840   04/18/22 1059  sodium chloride 0.9 % with cefoTEtan (CEFOTAN) ADS Med       Note to Pharmacy: Harden Mo E: cabinet override      04/18/22 1059 04/18/22 1243        Assessment/Plan SBO H/o gastric bypass 2017 Dr. Excell Seltzer, history of laparoscopic hysterectomy - CT 10/13 w/ SBO and TP poster pelvis. Mild twisting of mesentery within RLQ. Small HH -Admitted to hospitalist service 10/13 through  10/20 for SBO managed conservatively.  NG tube removed during admission but obstruction did not completely resolve.  She left AMA 10/20 after recommendation for continued n.p.o. status and NG tube replacement -CT 10/24 and 10/26 done without oral contrast showing diffuse small bowel distention concerning for ileus versus SBO however favor pSBO given history   POD#8 s/p exploratory laparotomy/ small bowel resection/ lysis of adhesions 04/18/22 - Dr. Brantley Stage -Intraop findings of Small bowel stricture POD#2 s/p ex lap abdominal closure w/ retention sutures for wound dehiscence 04/24/22 Dr. Grandville Silos  -Surgical path without IBD or neoplasm. Fibrous adhesion and benign mesothelial cell proliferation - tolerating FLD. Await further return in bowel function prior to advancing. Replete K and Mag - continue abdominal binder -  bid wet to dry dressing changes around retention sutures - multimodal pain control - increased robaxin dose today - ambulate   FEN: FLD. IVF per primary  ID: cefotetan 11/2 VTE: lovenox    Alcohol use Tobacco use DM GERD COPD Afib  HTN    LOS: 9 days    Wellington Hampshire, [facility] 04/26/2022, 9:11 AM Please see Amion for pager number during day hours 7:00am-4:30pm

## 2022-04-27 DIAGNOSIS — K56609 Unspecified intestinal obstruction, unspecified as to partial versus complete obstruction: Secondary | ICD-10-CM | POA: Diagnosis not present

## 2022-04-27 LAB — CBC
HCT: 35.7 % — ABNORMAL LOW (ref 36.0–46.0)
Hemoglobin: 11.7 g/dL — ABNORMAL LOW (ref 12.0–15.0)
MCH: 29.3 pg (ref 26.0–34.0)
MCHC: 32.8 g/dL (ref 30.0–36.0)
MCV: 89.3 fL (ref 80.0–100.0)
Platelets: 253 10*3/uL (ref 150–400)
RBC: 4 MIL/uL (ref 3.87–5.11)
RDW: 13.3 % (ref 11.5–15.5)
WBC: 6.6 10*3/uL (ref 4.0–10.5)
nRBC: 0 % (ref 0.0–0.2)

## 2022-04-27 LAB — BASIC METABOLIC PANEL
Anion gap: 8 (ref 5–15)
BUN: <5 mg/dL — ABNORMAL LOW (ref 6–20)
CO2: 26 mmol/L (ref 22–32)
Calcium: 8.2 mg/dL — ABNORMAL LOW (ref 8.9–10.3)
Chloride: 99 mmol/L (ref 98–111)
Creatinine, Ser: 0.53 mg/dL (ref 0.44–1.00)
GFR, Estimated: >60 mL/min (ref >60)
Glucose, Bld: 246 mg/dL — ABNORMAL HIGH (ref 70–99)
Potassium: 4.9 mmol/L (ref 3.5–5.1)
Sodium: 133 mmol/L — ABNORMAL LOW (ref 135–145)

## 2022-04-27 LAB — GLUCOSE, CAPILLARY
Glucose-Capillary: 169 mg/dL — ABNORMAL HIGH (ref 70–99)
Glucose-Capillary: 147 mg/dL — ABNORMAL HIGH (ref 70–99)
Glucose-Capillary: 184 mg/dL — ABNORMAL HIGH (ref 70–99)
Glucose-Capillary: 116 mg/dL — ABNORMAL HIGH (ref 70–99)

## 2022-04-27 LAB — MAGNESIUM: Magnesium: 1.8 mg/dL (ref 1.7–2.4)

## 2022-04-27 MED ORDER — INSULIN ASPART 100 UNIT/ML IJ SOLN
0.0000 [IU] | Freq: Three times a day (TID) | INTRAMUSCULAR | Status: DC
  Administered 2022-04-27 – 2022-04-28 (×2): 3 [IU] via SUBCUTANEOUS

## 2022-04-27 MED ORDER — POLYETHYLENE GLYCOL 3350 17 G PO PACK
17.0000 g | PACK | Freq: Every day | ORAL | Status: DC
  Administered 2022-04-28: 17 g via ORAL
  Filled 2022-04-27: qty 1

## 2022-04-27 NOTE — Progress Notes (Signed)
[facility] Progress Note  3 Days Post-Op  Subjective: CC-  Pain control improved after medication adjustments yesterday. She has not required any more dilaudid. Passing a lot of flatus, no BM. Denies n/v or abdominal bloating with PO intake.  Has already been up ambulating around the halls.  Objective: Vital signs in last 24 hours: Temp:  [97.8 F (36.6 C)-98.3 F (36.8 C)] 98.3 F (36.8 C) (11/05 0505) Pulse Rate:  [92-94] 94 (11/05 0505) Resp:  [15-18] 16 (11/05 0505) BP: (110-150)/(63-95) 134/93 (11/05 0505) SpO2:  [92 %-96 %] 92 % (11/05 0505) Last BM Date :  (PTA)  Intake/Output from previous day: 11/04 0701 - 11/05 0700 In: 920 [P.O.:920] Out: -  Intake/Output this shift: No intake/output data recorded.  PE: General: Alert, NAD Lungs: Respiratory effort nonlabored on room air Abd: soft, mild distension, bowel sounds present. Midline incision cdi with retention sutures intact, no drainage or cellulitis  Lab Results:  Recent Labs    04/26/22 0447 04/27/22 0414  WBC 9.4 6.6  HGB 11.8* 11.7*  HCT 35.6* 35.7*  PLT 245 253   BMET Recent Labs    04/26/22 0447 04/27/22 0414  NA 137 133*  K 3.4* 4.9  CL 98 99  CO2 28 26  GLUCOSE 220* 246*  BUN <5* <5*  CREATININE 0.40* 0.53  CALCIUM 8.3* 8.2*   PT/INR No results for input(s): "LABPROT", "INR" in the last 72 hours. CMP     Component Value Date/Time   NA 133 (L) 04/27/2022 0414   K 4.9 04/27/2022 0414   CL 99 04/27/2022 0414   CO2 26 04/27/2022 0414   GLUCOSE 246 (H) 04/27/2022 0414   BUN <5 (L) 04/27/2022 0414   CREATININE 0.53 04/27/2022 0414   CREATININE 0.57 03/28/2021 0000   CALCIUM 8.2 (L) 04/27/2022 0414   PROT 6.3 (L) 04/23/2022 2350   ALBUMIN 2.5 (L) 04/23/2022 2350   AST 12 (L) 04/23/2022 2350   ALT 12 04/23/2022 2350   ALKPHOS 82 04/23/2022 2350   BILITOT 0.2 (L) 04/23/2022 2350   GFRNONAA >60 04/27/2022 0414   GFRAA >60 08/03/2018 1514   Lipase     Component Value  Date/Time   LIPASE 40 04/17/2022 1018       Studies/Results: No results found.  Anti-infectives: Anti-infectives (From admission, onward)    Start     Dose/Rate Route Frequency Ordered Stop   04/24/22 1130  cefoTEtan (CEFOTAN) 2 g in sodium chloride 0.9 % 100 mL IVPB        2 g 200 mL/hr over 30 Minutes Intravenous On call to O.R. 04/24/22 1119 04/24/22 1322   04/24/22 1120  sodium chloride 0.9 % with cefoTEtan (CEFOTAN) ADS Med       Note to Pharmacy: Mendel Corning B: cabinet override      04/24/22 1120 04/24/22 1301   04/18/22 1100  cefoTEtan (CEFOTAN) 2 g in sodium chloride 0.9 % 100 mL IVPB        2 g 200 mL/hr over 30 Minutes Intravenous On call to O.R. 04/18/22 1053 04/19/22 0840   04/18/22 1059  sodium chloride 0.9 % with cefoTEtan (CEFOTAN) ADS Med       Note to Pharmacy: Harden Mo E: cabinet override      04/18/22 1059 04/18/22 1243        Assessment/Plan SBO H/o gastric bypass 2017 Dr. Excell Seltzer, history of laparoscopic hysterectomy - CT 10/13 w/ SBO and TP poster pelvis. Mild twisting of mesentery within RLQ. Small  [place] -Admitted to hospitalist service 10/13 through 10/20 for SBO managed conservatively.  NG tube removed during admission but obstruction did not completely resolve.  She left AMA 10/20 after recommendation for continued n.p.o. status and NG tube replacement -CT 10/24 and 10/26 done without oral contrast showing diffuse small bowel distention concerning for ileus versus SBO however favor pSBO given history   POD#9 s/p exploratory laparotomy/ small bowel resection/ lysis of adhesions 04/18/22 - Dr. Brantley Stage -Intraop findings of Small bowel stricture POD#3 s/p ex lap abdominal closure w/ retention sutures for wound dehiscence 04/24/22 Dr. Grandville Silos  -Surgical path without IBD or neoplasm. Fibrous adhesion and benign mesothelial cell proliferation - tolerating FLD and passing a lot of flatus. Advance to bariatric advanced diet - continue abdominal binder at  all times - bid wet to dry dressing changes around retention sutures. Will place home health order/ TOC consult - ambulate   FEN: bariatric advanced diet. IVF per primary  ID: cefotetan 11/2 VTE: lovenox    Alcohol use Tobacco use DM GERD COPD Afib  HTN    LOS: 10 days    Wellington Hampshire, [facility] 04/27/2022, 8:23 AM Please see Amion for pager number during day hours 7:00am-4:30pm

## 2022-04-27 NOTE — Progress Notes (Signed)
PROGRESS NOTE    [name]  JIR:[id number] DOB: 11-13-1965 DOA: 04/17/2022 PCP: [name], MD    Brief Narrative:  56 y.o. female with medical history significant of  Schizo affective d/o , COPD, Anemia, GERD, HTN, gastric bypass in 2017 by Dr. Hillary Bow recent interim history of  seizure s/p fall diagnosed 8/23.  In addition to history of  admission 7/23-8/23 for suicide attempt complicated by aspiration pneumonia with acute respiratory failure that was vent dependent. Had trach placed during that stay.  During this admission patient also developed colonic ileus and was seen by GI and placed on Linzess and lactulose.She was recently admitted on on 10/13-10/21 for SBO and was treated conservatively with small bowel protocol. She had persistent obstruction and left AMA on 10/21.  Went to the OR on 10/27.  Had dehisce on 11/2 and went back to the OR.   Diet slowly being advanced.     Assessment and Plan:  Small bowel obstruction/sp laparotomy/lysis of adhesions -General surgery following- + flatus, tolerating clear diet -pain control per GS- off PCA 11/2-- back to the OR -passing gas but no BM yet -patient ambulating   Hypokalemia -repleted   Polysubstance abuse ([place]) +cocaine and alcohol abuse -UDS never done   Schizoaffective disorder, bipolar type ([place]) History of suicide attempt in 12/2021 Has not been taking medication per family No SI/HI.  -Psychiatry was consulted, patient started on Haldol 2 mg p.o./IM every 8 hours as needed for agitation   Type 2 diabetes mellitus (HCC) A1C of 6.5 this month Hold metformin -SSI   Essential hypertension -adjust meds as able   COPD (chronic obstructive pulmonary disease) (HCC) No s/s of exacerbation Continue SABA prn and symbicort    Reported Seizure ([name]) Seizure precautions On no medication,  ? Alcohol withdrawal  Not on vimpat and has not been on this. Verified with family    GERD (gastroesophageal reflux  disease) -PPI   Memory loss ? Anoxic brain injury -will place referral to neurology up on d/c   DVT prophylaxis: Place and maintain sequential compression device Start: 04/24/22 0755 SCDs Start: 04/17/22 1618    Code Status: Full Code Family Communication: at bedside  Disposition Plan:  Level of care: Med-Surg Status is: Inpatient Remains inpatient appropriate because: needs to go back to the OR    Consultants:  Psych GS    Subjective: Up walking around room  Objective: Vitals:   04/26/22 2343 04/27/22 0505 04/27/22 0701 04/27/22 1123  BP: 110/63 (!) 134/93 (!) 145/89 (!) 148/96  Pulse: 94 94 96 (!) 103  Resp: '16 16 16 20  '$ Temp: 98.3 F (36.8 C) 98.3 F (36.8 C) 98.5 F (36.9 C) 98 F (36.7 C)  TempSrc: Oral Oral Oral Oral  SpO2: 94% 92% 95% 96%  Weight:      Height:        Intake/Output Summary (Last 24 hours) at 04/27/2022 1144 Last data filed at 04/27/2022 0900 Gross per 24 hour  Intake 820 ml  Output --  Net 820 ml   Filed Weights   04/17/22 0937 04/24/22 1113  Weight: 49.4 kg 49.9 kg    Examination:    General: Appearance:    Well developed, well nourished female in no acute distress     Lungs:      respirations unlabored  Heart:    Tachycardic.   MS:   All extremities are intact. Up walking in room  Neurologic:   Awake, alert  Data Reviewed: I have personally reviewed following labs and imaging studies  CBC: Recent Labs  Lab 04/23/22 2350 04/24/22 0421 04/25/22 0800 04/26/22 0447 04/27/22 0414  WBC 11.2* 10.2 11.0* 9.4 6.6  HGB 12.8 12.3 13.6 11.8* 11.7*  HCT 38.2 36.4 40.4 35.6* 35.7*  MCV 88.0 88.1 89.2 87.9 89.3  PLT 264 240 232 245 235   Basic Metabolic Panel: Recent Labs  Lab 04/23/22 2350 04/24/22 0421 04/25/22 0800 04/26/22 0447 04/27/22 0414  NA 138 138 135 137 133*  K 3.8 3.5 3.7 3.4* 4.9  CL 100 99 95* 98 99  CO2 '28 30 28 28 26  '$ GLUCOSE 136* 116* 127* 220* 246*  BUN <5* <5* <5* <5* <5*  CREATININE  0.46 0.42* 0.42* 0.40* 0.53  CALCIUM 8.5* 8.4* 8.1* 8.3* 8.2*  MG 1.7  --   --  1.7 1.8  PHOS 3.8  --   --   --   --    GFR: Estimated Creatinine Clearance: 61.1 mL/min (by C-G formula based on SCr of 0.53 mg/dL). Liver Function Tests: Recent Labs  Lab 04/23/22 2350  AST 12*  ALT 12  ALKPHOS 82  BILITOT 0.2*  PROT 6.3*  ALBUMIN 2.5*   No results for input(s): "LIPASE", "AMYLASE" in the last 168 hours.  No results for input(s): "AMMONIA" in the last 168 hours. Coagulation Profile: No results for input(s): "INR", "PROTIME" in the last 168 hours.  Cardiac Enzymes: No results for input(s): "CKTOTAL", "CKMB", "CKMBINDEX", "TROPONINI" in the last 168 hours. BNP (last 3 results) No results for input(s): "PROBNP" in the last 8760 hours. HbA1C: No results for input(s): "HGBA1C" in the last 72 hours. CBG: Recent Labs  Lab 04/26/22 0848 04/26/22 1216 04/26/22 2100 04/27/22 0741 04/27/22 1120  GLUCAP 109* 152* 129* 169* 147*   Lipid Profile: No results for input(s): "CHOL", "HDL", "LDLCALC", "TRIG", "CHOLHDL", "LDLDIRECT" in the last 72 hours. Thyroid Function Tests: No results for input(s): "TSH", "T4TOTAL", "FREET4", "T3FREE", "THYROIDAB" in the last 72 hours. Anemia Panel: No results for input(s): "VITAMINB12", "FOLATE", "FERRITIN", "TIBC", "IRON", "RETICCTPCT" in the last 72 hours. Sepsis Labs: No results for input(s): "PROCALCITON", "LATICACIDVEN" in the last 168 hours.   No results found for this or any previous visit (from the past 240 hour(s)).       Radiology Studies: No results found.      Scheduled Meds:  acetaminophen  1,000 mg Oral Q6H   docusate sodium  100 mg Oral BID   folic acid  1 mg Oral Daily   guaiFENesin  600 mg Oral BID   haloperidol  2 mg Oral TID   Or   haloperidol lactate  2 mg Intramuscular TID   insulin aspart  0-9 Units Subcutaneous TID WC   methocarbamol  750 mg Oral QID   nicotine  21 mg Transdermal Daily   pantoprazole  40  mg Oral Daily   [START ON 04/28/2022] polyethylene glycol  17 g Oral Daily   thiamine  100 mg Oral Daily   Continuous Infusions:      LOS: 10 days    Time spent: 45 minutes spent on chart review, discussion with nursing staff, consultants, updating family and interview/physical exam; more than 50% of that time was spent in counseling and/or coordination of care.    Geradine Girt, DO [facility] Available via Epic secure chat 7am-7pm After these hours, please refer to coverage provider listed on [email or web address] 04/27/2022, 11:44 AM

## 2022-04-27 NOTE — Progress Notes (Signed)
Mobility Specialist Progress Note   04/27/22 0845  Mobility  Activity Ambulated independently in hallway  Level of Assistance Independent  Assistive Device None  Distance Ambulated (ft) 480 ft  Range of Motion/Exercises Active;All extremities  Activity Response Tolerated well   Patient received standing in room, agreeable to participate in mobility. Ambulated independently  with slow steady gait. Returned to room without complaint or incident. Was left sitting in recliner with all needs met, call bell in reach.   [name], [place], [place]  Office: [phone]

## 2022-04-28 ENCOUNTER — Ambulatory Visit: Payer: Medicare Other | Admitting: Family Medicine

## 2022-04-28 LAB — BPAM RBC
Blood Product Expiration Date: 202311232359
Blood Product Expiration Date: 202311232359
Unit Type and Rh: 6200
Unit Type and Rh: 6200

## 2022-04-28 LAB — TYPE AND SCREEN
ABO/RH(D): A POS
Antibody Screen: NEGATIVE
Donor AG Type: NEGATIVE
Donor AG Type: NEGATIVE
Unit division: 0
Unit division: 0

## 2022-04-28 LAB — GLUCOSE, CAPILLARY
Glucose-Capillary: 154 mg/dL — ABNORMAL HIGH (ref 70–99)
Glucose-Capillary: 95 mg/dL (ref 70–99)
Glucose-Capillary: 149 mg/dL — ABNORMAL HIGH (ref 70–99)
Glucose-Capillary: 98 mg/dL (ref 70–99)

## 2022-04-28 MED ORDER — TRAMADOL HCL 50 MG PO TABS
50.0000 mg | ORAL_TABLET | Freq: Four times a day (QID) | ORAL | Status: DC | PRN
  Administered 2022-04-28 – 2022-04-29 (×3): 50 mg via ORAL
  Filled 2022-04-28 (×3): qty 1

## 2022-04-28 MED ORDER — INSULIN ASPART 100 UNIT/ML IJ SOLN: 0.0000 [IU] | Freq: Every day | INTRAMUSCULAR | Status: DC

## 2022-04-28 MED ORDER — SORBITOL 70 % SOLN: 960.0000 mL | TOPICAL_OIL | Freq: Once | ORAL | Status: DC | PRN

## 2022-04-28 MED ORDER — INSULIN ASPART 100 UNIT/ML IJ SOLN
0.0000 [IU] | Freq: Three times a day (TID) | INTRAMUSCULAR | Status: DC
  Administered 2022-04-28: 3 [IU] via SUBCUTANEOUS
  Administered 2022-04-29: 4 [IU] via SUBCUTANEOUS

## 2022-04-28 MED ORDER — TRAMADOL HCL 50 MG PO TABS
50.0000 mg | ORAL_TABLET | Freq: Once | ORAL | Status: AC
  Administered 2022-04-28: 50 mg via ORAL
  Filled 2022-04-28: qty 1

## 2022-04-28 MED ORDER — POLYETHYLENE GLYCOL 3350 17 G PO PACK
17.0000 g | PACK | Freq: Two times a day (BID) | ORAL | Status: DC
  Filled 2022-04-28: qty 1

## 2022-04-28 MED ORDER — BISACODYL 10 MG RE SUPP
10.0000 mg | Freq: Every day | RECTAL | Status: DC | PRN
  Administered 2022-04-28: 10 mg via RECTAL
  Filled 2022-04-28: qty 1

## 2022-04-28 NOTE — Discharge Instructions (Addendum)
Wet to Dry WOUND CARE (between retention sutures): - Change dressing twice daily - Supplies: sterile saline, kerlex, scissors, ABD pads, tape  Remove dressing and all packing carefully, moistening with sterile saline as needed to avoid packing/internal dressing sticking to the wound. 2.   Clean edges of skin around the wound with water/gauze, making sure there is no tape debris or leakage left on skin that could cause skin irritation or breakdown. 3.   Dampen and clean kerlex with sterile saline and pack wound from wound base to skin level, making sure to take note of any possible areas of wound tracking, tunneling and packing appropriately. Wound can be packed loosely. Trim kerlex to size if a whole kerlex is not required. Do not cut sutures.  4.   Cover wound with a dry ABD pad and secure with tape.  5.   Write the date/time on the dry dressing/tape to better track when the last dressing change occurred. 6. Where your abdominal binder - apply any skin protectant/powder if recommended by clinician to protect skin/skin folds. - change dressing as needed if leakage occurs, wound gets contaminated, or patient requests to shower. - You may shower daily with wound open and following the shower the wound should be dried and a clean dressing placed.  - Medical grade tape as well as packing supplies can be found at [employer] on [address] or [employer] on [address]. The remaining supplies can be found at your local drug store, [employer] etc.    [facility], [place] [phone]  OPEN ABDOMINAL SURGERY: POST OP INSTRUCTIONS  Always review your discharge instruction sheet given to you by the facility where your surgery was performed.  IF YOU HAVE DISABILITY OR FAMILY LEAVE FORMS, YOU MUST BRING THEM TO THE OFFICE FOR PROCESSING.  PLEASE DO NOT GIVE THEM TO YOUR DOCTOR.  A prescription for pain medication may be given to you upon discharge.  Take your  pain medication as prescribed, if needed.  If narcotic pain medicine is not needed, then you may take acetaminophen (Tylenol). You should not take NSAIDs like Advil/Ibuprofen with your history of gastric bypass.  Take your usually prescribed medications unless otherwise directed. If you need a refill on your pain medication, please contact your pharmacy. They will contact our office to request authorization.  Prescriptions will not be filled after 5pm or on week-ends. You should follow a light diet the first few days after arrival home, such as soup and crackers, pudding, etc.unless your doctor has advised otherwise. A high-fiber, low fat diet can be resumed as tolerated.   Be sure to include lots of fluids daily. Most patients will experience some swelling and bruising on the chest and neck area.  Ice packs will help.  Swelling and bruising can take several days to resolve Most patients will experience some swelling and bruising in the area of the incision. Ice pack will help. Swelling and bruising can take several days to resolve..  It is common to experience some constipation if taking pain medication after surgery.  Increasing fluid intake and taking a stool softener will usually help or prevent this problem from occurring.  A mild laxative (Milk of Magnesia or Miralax) should be taken according to package directions if there are no bowel movements after 48 hours.  You may have steri-strips (small skin tapes) in place directly over the incision.  These strips should be left on the skin for 7-10 days.  If your surgeon used skin glue on the incision, you may shower in 24 hours.  The glue will flake off over the next 2-3 weeks.  Any sutures or staples will be removed at the office during your follow-up visit. You may find that a light gauze bandage over your incision may keep your staples from being rubbed or pulled. You may shower and replace the bandage daily. ACTIVITIES:  You may resume regular (light)  daily activities beginning the next day--such as daily self-care, walking, climbing stairs--gradually increasing activities as tolerated.  You may have sexual intercourse when it is comfortable.  Refrain from any heavy lifting or straining until approved by your doctor. You may drive when you no longer are taking prescription pain medication, you can comfortably wear a seatbelt, and you can safely maneuver your car and apply brakes Return to Work: ___________________________________ Dennis Bast should see your doctor in the office for a follow-up appointment approximately two weeks after your surgery.  Make sure that you call for this appointment within a day or two after you arrive home to insure a convenient appointment time. OTHER INSTRUCTIONS:  _____________________________________________________________ _____________________________________________________________  WHEN TO CALL YOUR DOCTOR: Fever over 101.0 Inability to urinate Nausea and/or vomiting Extreme swelling or bruising Continued bleeding from incision. Increased pain, redness, or drainage from the incision. Difficulty swallowing or breathing Muscle cramping or spasms. Numbness or tingling in hands or feet or around lips.  The clinic staff is available to answer your questions during regular business hours.  Please don't hesitate to call and ask to speak to one of the nurses if you have concerns.  For further questions, please visit [email or web address]

## 2022-04-28 NOTE — Progress Notes (Signed)
PROGRESS NOTE    [name]  LGX:[id number] DOB: Dec 14, 1965 DOA: 04/17/2022 PCP: [name], MD    Brief Narrative:  56 y.o. female with medical history significant of  Schizo affective d/o , COPD, Anemia, GERD, HTN, gastric bypass in 2017 by Dr. Hillary Bow recent interim history of  seizure s/p fall diagnosed 8/23.  In addition to history of  admission 7/23-8/23 for suicide attempt complicated by aspiration pneumonia with acute respiratory failure that was vent dependent. Had trach placed during that stay.  During this admission patient also developed colonic ileus and was seen by GI and placed on Linzess and lactulose.She was recently admitted on on 10/13-10/21 for SBO and was treated conservatively with small bowel protocol. She had persistent obstruction and left AMA on 10/21.  Went to the OR on 10/27.  Had dehisce on 11/2 and went back to the OR.   Diet slowly being advanced.  For d/c need: BM and plan for wound care from [facility]   Assessment and Plan:  Small bowel obstruction/sp laparotomy/lysis of adhesions -General surgery following- + flatus, tolerating clear diet -pain control per GS- off PCA 11/2-- back to the OR -passing gas but no BM yet -patient ambulating   Hypokalemia -repleted   Polysubstance abuse ([place]) +cocaine and alcohol abuse -UDS never done   Schizoaffective disorder, bipolar type ([place]) History of suicide attempt in 12/2021 Has not been taking medication per family No SI/HI.  -Psychiatry was consulted, patient started on Haldol 2 mg p.o./IM every 8 hours as needed for agitation   Type 2 diabetes mellitus (HCC) A1C of 6.5 this month Hold metformin -SSI   Essential hypertension -adjust meds as able   COPD (chronic obstructive pulmonary disease) (HCC) No s/s of exacerbation Continue SABA prn and symbicort    Reported Seizure ([place]) Seizure precautions On no medication,  ? Alcohol withdrawal  Not on vimpat and has not been on this. Verified  with family    GERD (gastroesophageal reflux disease) -PPI   Memory loss ? Anoxic brain injury - placed referral to neurology up on d/c   DVT prophylaxis: Place and maintain sequential compression device Start: 04/24/22 0755 SCDs Start: 04/17/22 1618    Code Status: Full Code Family Communication: called daughter  Disposition Plan:  Level of care: Med-Surg Status is: Inpatient Remains inpatient appropriate because: home in 48-72 hours?    Consultants:  Psych GS    Subjective: Up walking around room  Objective: Vitals:   04/27/22 1442 04/27/22 2100 04/27/22 2343 04/28/22 0815  BP: (!) 147/78 129/68 126/65 137/85  Pulse: (!) 108 96 97 100  Resp: '20 20 20   '$ Temp: 98.2 F (36.8 C) 98.4 F (36.9 C) 98 F (36.7 C) 98.6 F (37 C)  TempSrc: Oral Oral Oral Oral  SpO2: 95% 96% 93% 96%  Weight:      Height:        Intake/Output Summary (Last 24 hours) at 04/28/2022 1140 Last data filed at 04/28/2022 0500 Gross per 24 hour  Intake 840 ml  Output --  Net 840 ml   Filed Weights   04/17/22 0937 04/24/22 1113  Weight: 49.4 kg 49.9 kg    Examination:     General: Appearance:    Well developed, well nourished female in no acute distress     Lungs:     respirations unlabored  Heart:    Tachycardic. Normal rhythm. No murmurs, rubs, or gallops.   MS:   All extremities are intact.   Neurologic:  Awake, alert         Data Reviewed: I have personally reviewed following labs and imaging studies  CBC: Recent Labs  Lab 04/23/22 2350 04/24/22 0421 04/25/22 0800 04/26/22 0447 04/27/22 0414  WBC 11.2* 10.2 11.0* 9.4 6.6  HGB 12.8 12.3 13.6 11.8* 11.7*  HCT 38.2 36.4 40.4 35.6* 35.7*  MCV 88.0 88.1 89.2 87.9 89.3  PLT 264 240 232 245 161   Basic Metabolic Panel: Recent Labs  Lab 04/23/22 2350 04/24/22 0421 04/25/22 0800 04/26/22 0447 04/27/22 0414  NA 138 138 135 137 133*  K 3.8 3.5 3.7 3.4* 4.9  CL 100 99 95* 98 99  CO2 '28 30 28 28 26  '$ GLUCOSE  136* 116* 127* 220* 246*  BUN <5* <5* <5* <5* <5*  CREATININE 0.46 0.42* 0.42* 0.40* 0.53  CALCIUM 8.5* 8.4* 8.1* 8.3* 8.2*  MG 1.7  --   --  1.7 1.8  PHOS 3.8  --   --   --   --    GFR: Estimated Creatinine Clearance: 61.1 mL/min (by C-G formula based on SCr of 0.53 mg/dL). Liver Function Tests: Recent Labs  Lab 04/23/22 2350  AST 12*  ALT 12  ALKPHOS 82  BILITOT 0.2*  PROT 6.3*  ALBUMIN 2.5*   No results for input(s): "LIPASE", "AMYLASE" in the last 168 hours.  No results for input(s): "AMMONIA" in the last 168 hours. Coagulation Profile: No results for input(s): "INR", "PROTIME" in the last 168 hours.  Cardiac Enzymes: No results for input(s): "CKTOTAL", "CKMB", "CKMBINDEX", "TROPONINI" in the last 168 hours. BNP (last 3 results) No results for input(s): "PROBNP" in the last 8760 hours. HbA1C: No results for input(s): "HGBA1C" in the last 72 hours. CBG: Recent Labs  Lab 04/27/22 1120 04/27/22 1528 04/27/22 2059 04/28/22 0811 04/28/22 1123  GLUCAP 147* 184* 116* 154* 95   Lipid Profile: No results for input(s): "CHOL", "HDL", "LDLCALC", "TRIG", "CHOLHDL", "LDLDIRECT" in the last 72 hours. Thyroid Function Tests: No results for input(s): "TSH", "T4TOTAL", "FREET4", "T3FREE", "THYROIDAB" in the last 72 hours. Anemia Panel: No results for input(s): "VITAMINB12", "FOLATE", "FERRITIN", "TIBC", "IRON", "RETICCTPCT" in the last 72 hours. Sepsis Labs: No results for input(s): "PROCALCITON", "LATICACIDVEN" in the last 168 hours.   No results found for this or any previous visit (from the past 240 hour(s)).       Radiology Studies: No results found.      Scheduled Meds:  acetaminophen  1,000 mg Oral Q6H   docusate sodium  100 mg Oral BID   folic acid  1 mg Oral Daily   guaiFENesin  600 mg Oral BID   haloperidol  2 mg Oral TID   Or   haloperidol lactate  2 mg Intramuscular TID   insulin aspart  0-15 Units Subcutaneous TID WC   methocarbamol  750 mg  Oral QID   nicotine  21 mg Transdermal Daily   pantoprazole  40 mg Oral Daily   polyethylene glycol  17 g Oral BID   thiamine  100 mg Oral Daily   Continuous Infusions:      LOS: 11 days    Time spent: 45 minutes spent on chart review, discussion with nursing staff, consultants, updating family and interview/physical exam; more than 50% of that time was spent in counseling and/or coordination of care.    Geradine Girt, DO [facility] Available via Epic secure chat 7am-7pm After these hours, please refer to coverage provider listed on [email or web address] 04/28/2022, 11:40 AM

## 2022-04-28 NOTE — Care Management Important Message (Signed)
Important Message  Patient Details  Name: [name] MRN: [id number] Date of Birth: October 06, 1965   Medicare Important Message Given:  Yes     [name] 04/28/2022, 12:30 PM

## 2022-04-28 NOTE — Progress Notes (Signed)
Mobility Specialist Progress Note   04/28/22 1703  Mobility  Activity Ambulated independently in hallway  Level of Assistance Independent after set-up  Assistive Device None  Distance Ambulated (ft) 240 ft  Activity Response Tolerated well  $Mobility charge 1 Mobility   Received pt in doorway having no complaints and agreeable to mobility. Pt was asymptomatic throughout ambulation and returned to room w/o fault. Left in chair w/ call bell in reach and all needs met.  Holland Falling Mobility Specialist Acute Rehab Office:  [phone]

## 2022-04-28 NOTE — Progress Notes (Signed)
4 Days Post-Op  Subjective: CC: Stable incisional pain. Feels like pain medications are helping but asking for adjustments. Tolerating diet and finishing most of her trays without n/v. Passing flatus. No BM. Voiding. Mobilizing well. Reports she lives alone but has support from family.   Objective: Vital signs in last 24 hours: Temp:  [98 F (36.7 C)-98.6 F (37 C)] 98.6 F (37 C) (11/06 0815) Pulse Rate:  [96-108] 100 (11/06 0815) Resp:  [20] 20 (11/05 2343) BP: (126-148)/(65-96) 137/85 (11/06 0815) SpO2:  [93 %-96 %] 96 % (11/06 0815) Last BM Date : 04/23/22  Intake/Output from previous day: 11/05 0701 - 11/06 0700 In: 1320 [P.O.:1320] Out: -  Intake/Output this shift: No intake/output data recorded.  PE: General: Alert, NAD Lungs: Respiratory effort nonlabored on room air Abd: soft, mild distension, reports generalized "pressure" but no ttp. bowel sounds present. Midline incision cdi with retention sutures intact, no drainage or cellulitis  Lab Results:  Recent Labs    04/26/22 0447 04/27/22 0414  WBC 9.4 6.6  HGB 11.8* 11.7*  HCT 35.6* 35.7*  PLT 245 253   BMET Recent Labs    04/26/22 0447 04/27/22 0414  NA 137 133*  K 3.4* 4.9  CL 98 99  CO2 28 26  GLUCOSE 220* 246*  BUN <5* <5*  CREATININE 0.40* 0.53  CALCIUM 8.3* 8.2*   PT/INR No results for input(s): "LABPROT", "INR" in the last 72 hours. CMP     Component Value Date/Time   NA 133 (L) 04/27/2022 0414   K 4.9 04/27/2022 0414   CL 99 04/27/2022 0414   CO2 26 04/27/2022 0414   GLUCOSE 246 (H) 04/27/2022 0414   BUN <5 (L) 04/27/2022 0414   CREATININE 0.53 04/27/2022 0414   CREATININE 0.57 03/28/2021 0000   CALCIUM 8.2 (L) 04/27/2022 0414   PROT 6.3 (L) 04/23/2022 2350   ALBUMIN 2.5 (L) 04/23/2022 2350   AST 12 (L) 04/23/2022 2350   ALT 12 04/23/2022 2350   ALKPHOS 82 04/23/2022 2350   BILITOT 0.2 (L) 04/23/2022 2350   GFRNONAA >60 04/27/2022 0414   GFRAA >60 08/03/2018 1514    Lipase     Component Value Date/Time   LIPASE 40 04/17/2022 1018    Studies/Results: No results found.  Anti-infectives: Anti-infectives (From admission, onward)    Start     Dose/Rate Route Frequency Ordered Stop   04/24/22 1130  cefoTEtan (CEFOTAN) 2 g in sodium chloride 0.9 % 100 mL IVPB        2 g 200 mL/hr over 30 Minutes Intravenous On call to O.R. 04/24/22 1119 04/24/22 1322   04/24/22 1120  sodium chloride 0.9 % with cefoTEtan (CEFOTAN) ADS Med       Note to Pharmacy: Mendel Corning B: cabinet override      04/24/22 1120 04/24/22 1301   04/18/22 1100  cefoTEtan (CEFOTAN) 2 g in sodium chloride 0.9 % 100 mL IVPB        2 g 200 mL/hr over 30 Minutes Intravenous On call to O.R. 04/18/22 1053 04/19/22 0840   04/18/22 1059  sodium chloride 0.9 % with cefoTEtan (CEFOTAN) ADS Med       Note to Pharmacy: Harden Mo E: cabinet override      04/18/22 1059 04/18/22 1243        Assessment/Plan SBO H/o gastric bypass 2017 Dr. Excell Seltzer, history of laparoscopic hysterectomy - CT 10/13 w/ SBO and TP poster pelvis. Mild twisting of mesentery within RLQ. Small HH -  Admitted to hospitalist service 10/13 through 10/20 for SBO managed conservatively.  NG tube removed during admission but obstruction did not completely resolve.  She left AMA 10/20 after recommendation for continued n.p.o. status and NG tube replacement -CT 10/24 and 10/26 done without oral contrast showing diffuse small bowel distention concerning for ileus versus SBO however favor pSBO given history   POD#10 s/p exploratory laparotomy/ small bowel resection/ lysis of adhesions 04/18/22 - Dr. Brantley Stage -Intraop findings of Small bowel stricture POD#4 s/p ex lap abdominal closure w/ retention sutures for wound dehiscence 04/24/22 Dr. Grandville Silos  -Surgical path without IBD or neoplasm. Fibrous adhesion and benign mesothelial cell proliferation -Tolerating soft diet and passing a lot of flatus. No BM yet, increase bowel  regimgen - continue abdominal binder at all times - bid wet to dry dressing changes around retention sutures. Will place home health order/ TOC consult - ambulate   FEN: Soft, bid miralax and colace. IVF per primary  ID: cefotetan 11/2. None currently.  VTE: lovenox    Alcohol use  Tobacco use DM GERD COPD Afib  HTN   LOS: 11 days    Jillyn Ledger , [facility] 04/28/2022, 10:23 AM Please see Amion for pager number during day hours 7:00am-4:30pm

## 2022-04-28 NOTE — Progress Notes (Signed)
Patient had a medium soft brown bowel movement after suppository. MD notified.

## 2022-04-28 NOTE — TOC Transition Note (Signed)
Transition of Care (TOC) - CM/SW Discharge Note Marvetta Gibbons RN,BSN Transitions of Care Unit 4NP (Non Trauma)- RN Case Manager See Treatment Team for direct Phone #   Patient Details  Name: [name] MRN: [id number] Date of Birth: [birth year]  Transition of Care [facility]) CM/SW Contact:  Dawayne Patricia, RN Phone Number: 04/28/2022, 2:18 PM   Clinical Narrative:    Pt pending transition home when medically cleared. HHRN order placed for wound care needs.  CM spoke with pt at bedside- and list provided for [facility] choice- Per CMS guidelines from [email or web address] website with star ratings (copy placed in shadow chart) pt called daughter [name] while CM there in room to discuss [facility] needs. Per [name] family will be able to assist pt with wound care at home (have family member who is a Marine scientist), understand that [facility] will not be coming daily but will come to support wound care and keep eye on wound. Per [name] family does not have a preference for [facility] and defers to this Probation officer to secure on [employer].  [name] also states that mom has a P[facility] aide w/ [facility] that comes 2.5hr/day. Has home 02 w/ [employer], cane and RW at home.   Family will transport home, Address, phone # confirmed. Per [name] patient was to establish with new PCP this week but will need to reschedule- new PCP will be with [place].  Call made to [facility] for [facility] needs- Per Angie they can accept referral for [facility]- with start of care for 11/9. Provided daughter [name] contact for coordinating [place].    Final next level of care: [place] Barriers to Discharge: Barriers Resolved   Patient Goals and CMS Choice Patient states their goals for this hospitalization and ongoing recovery are:: return home CMS Medicare.gov Compare Post Acute Care list provided to:: Patient Choice offered to / list presented to : Adult Children Pharmacist, community)  Discharge Placement               Home w/ [facility]         Discharge Plan and Services   Discharge Planning Services: CM Consult Post Acute Care Choice: Home Health          DME Arranged: N/A DME Agency: NA       HH Arranged: RN [place] Agency: [place] Date [place] Agency Contacted: 04/28/22 Time [place]: [id number] Representative spoke with at [place]: [place] Determinants of Health ([place]) Interventions     Readmission Risk Interventions    04/28/2022    2:18 PM 04/07/2022   12:41 PM 07/27/2020   11:06 AM  Readmission Risk Prevention Plan  Transportation Screening Complete Complete Complete  PCP or Specialist Appt within 5-7 Days   Complete  Home Care Screening   Complete  HRI or Home Care Consult  Complete   Social Work Consult for [place] Planning/Counseling  Complete   Palliative Care Screening  Not Applicable   Medication Review Press photographer) Complete Complete   PCP or Specialist appointment within 3-5 days of discharge Complete    HRI or [place] Complete    SW Recovery Care/Counseling Consult Complete    [place] Not Applicable

## 2022-04-29 ENCOUNTER — Encounter (HOSPITAL_COMMUNITY): Payer: Self-pay | Admitting: Emergency Medicine

## 2022-04-29 ENCOUNTER — Emergency Department (HOSPITAL_COMMUNITY)
Admission: EM | Admit: 2022-04-29 | Discharge: 2022-04-30 | Discharge status: Against Medical Advice | Payer: Medicare Other | Attending: Student | Admitting: Student

## 2022-04-29 ENCOUNTER — Other Ambulatory Visit: Payer: Self-pay

## 2022-04-29 DIAGNOSIS — R109 Unspecified abdominal pain: Secondary | ICD-10-CM | POA: Insufficient documentation

## 2022-04-29 DIAGNOSIS — Z5321 Procedure and treatment not carried out due to patient leaving prior to being seen by health care provider: Secondary | ICD-10-CM | POA: Insufficient documentation

## 2022-04-29 DIAGNOSIS — R000 Tachycardia, unspecified: Secondary | ICD-10-CM | POA: Diagnosis not present

## 2022-04-29 LAB — CBC
HCT: 37.7 % (ref 36.0–46.0)
Hemoglobin: 12.4 g/dL (ref 12.0–15.0)
MCH: 29.2 pg (ref 26.0–34.0)
MCHC: 32.9 g/dL (ref 30.0–36.0)
MCV: 88.7 fL (ref 80.0–100.0)
Platelets: 359 10*3/uL (ref 150–400)
RBC: 4.25 MIL/uL (ref 3.87–5.11)
RDW: 13.6 % (ref 11.5–15.5)
WBC: 7.9 10*3/uL (ref 4.0–10.5)
nRBC: 0 % (ref 0.0–0.2)

## 2022-04-29 LAB — BASIC METABOLIC PANEL
Anion gap: 13 (ref 5–15)
BUN: <5 mg/dL — ABNORMAL LOW (ref 6–20)
CO2: 26 mmol/L (ref 22–32)
Calcium: 8.7 mg/dL — ABNORMAL LOW (ref 8.9–10.3)
Chloride: 95 mmol/L — ABNORMAL LOW (ref 98–111)
Creatinine, Ser: 0.43 mg/dL — ABNORMAL LOW (ref 0.44–1.00)
GFR, Estimated: >60 mL/min (ref >60)
Glucose, Bld: 193 mg/dL — ABNORMAL HIGH (ref 70–99)
Potassium: 3.7 mmol/L (ref 3.5–5.1)
Sodium: 134 mmol/L — ABNORMAL LOW (ref 135–145)

## 2022-04-29 LAB — GLUCOSE, CAPILLARY: Glucose-Capillary: 166 mg/dL — ABNORMAL HIGH (ref 70–99)

## 2022-04-29 MED ORDER — ACETAMINOPHEN 500 MG PO TABS: 1000.0000 mg | ORAL_TABLET | Freq: Four times a day (QID) | ORAL | 0 refills | Status: DC | PRN

## 2022-04-29 MED ORDER — BISACODYL 10 MG RE SUPP: 10.0000 mg | Freq: Every day | RECTAL | 0 refills | Status: DC | PRN

## 2022-04-29 MED ORDER — NICOTINE 21 MG/24HR TD PT24: 21.0000 mg | MEDICATED_PATCH | Freq: Every day | TRANSDERMAL | 0 refills | Status: DC

## 2022-04-29 MED ORDER — DOCUSATE SODIUM 100 MG PO CAPS: 100.0000 mg | ORAL_CAPSULE | Freq: Two times a day (BID) | ORAL | 0 refills | Status: DC

## 2022-04-29 MED ORDER — OXYCODONE HCL 5 MG PO TABS: 5.0000 mg | ORAL_TABLET | ORAL | 0 refills | Status: DC | PRN

## 2022-04-29 MED ORDER — ONDANSETRON HCL 4 MG PO TABS: 4.0000 mg | ORAL_TABLET | Freq: Four times a day (QID) | ORAL | 0 refills | Status: DC | PRN

## 2022-04-29 MED ORDER — TRAMADOL HCL 50 MG PO TABS: 50.0000 mg | ORAL_TABLET | Freq: Two times a day (BID) | ORAL | 0 refills | Status: DC | PRN

## 2022-04-29 MED ORDER — METHOCARBAMOL 750 MG PO TABS: 750.0000 mg | ORAL_TABLET | Freq: Four times a day (QID) | ORAL | 0 refills | Status: DC | PRN

## 2022-04-29 NOTE — ED Notes (Signed)
Registration states this patient left

## 2022-04-29 NOTE — ED Triage Notes (Signed)
Pt arrives from home via [facility], he is coming from home, released this am after bowel surgery. Has been up walking and drinking etoh since d/c.  Now c/o pain at the surgery site. Hr 130,  128/88, sats 92% (supposed to be on home O2 and refusing). Site looks good, no signs of drainage.

## 2022-04-29 NOTE — ED Provider Triage Note (Signed)
Emergency Medicine Provider Triage Evaluation Note  [name] , a 56 y.o. female  was evaluated in triage.  Pt complains of abdominal pain which radiates to the back.  She had a bowel resection on the first.  She denies chest pain or shortness of breath.  Patient endorses drinking a 40 ounce of beer prior to arrival, states she drinks this daily.  Review of Systems  Per HPI  Physical Exam  BP (!) 127/91 (BP Location: Right Arm)   Pulse (!) 136   Temp 98.9 F (37.2 C) (Oral)   Resp 20   LMP 01/08/2011   SpO2 94%  Gen:   Awake, uncomfortable appearing Resp:  Normal effort  MSK:   Moves extremities without difficulty  Other:  Tachycardic, tenderness over surgical site.  Medical Decision Making  Medically screening exam initiated at 9:36 PM.  Appropriate orders placed.  Icelyn L Soler was informed that the remainder of the evaluation will be completed by another provider, this initial triage assessment does not replace that evaluation, and the importance of remaining in the ED until their evaluation is complete.     Sherrill Raring, PA-C 04/29/22 2139

## 2022-04-29 NOTE — Progress Notes (Signed)
5 Days Post-Op  Subjective: CC: Doing well. Pain well controlled with medication changes. Tolerating diet without n/v. BM yesterday and today. Voiding. No problems with ambulation.   Objective: Vital signs in last 24 hours: Temp:  [97.8 F (36.6 C)-98.2 F (36.8 C)] 98.2 F (36.8 C) (11/07 0810) Pulse Rate:  [106-108] 106 (11/07 0810) Resp:  [15-20] 15 (11/07 0810) BP: (127-141)/(81-87) 127/81 (11/07 0810) SpO2:  [96 %-97 %] 96 % (11/07 0810) Last BM Date : 04/23/22  Intake/Output from previous day: No intake/output data recorded. Intake/Output this shift: No intake/output data recorded.  PE: General: Alert, NAD Lungs: Respiratory effort nonlabored on room air Abd: soft, mild distension, reports generalized "pressure" but no ttp. bowel sounds present. Midline incision cdi with retention sutures intact, no drainage or cellulitis  Lab Results:  Recent Labs    04/27/22 0414 04/29/22 0754  WBC 6.6 7.9  HGB 11.7* 12.4  HCT 35.7* 37.7  PLT 253 359   BMET Recent Labs    04/27/22 0414 04/29/22 0754  NA 133* 134*  K 4.9 3.7  CL 99 95*  CO2 26 26  GLUCOSE 246* 193*  BUN <5* <5*  CREATININE 0.53 0.43*  CALCIUM 8.2* 8.7*   PT/INR No results for input(s): "LABPROT", "INR" in the last 72 hours. CMP     Component Value Date/Time   NA 134 (L) 04/29/2022 0754   K 3.7 04/29/2022 0754   CL 95 (L) 04/29/2022 0754   CO2 26 04/29/2022 0754   GLUCOSE 193 (H) 04/29/2022 0754   BUN <5 (L) 04/29/2022 0754   CREATININE 0.43 (L) 04/29/2022 0754   CREATININE 0.57 03/28/2021 0000   CALCIUM 8.7 (L) 04/29/2022 0754   PROT 6.3 (L) 04/23/2022 2350   ALBUMIN 2.5 (L) 04/23/2022 2350   AST 12 (L) 04/23/2022 2350   ALT 12 04/23/2022 2350   ALKPHOS 82 04/23/2022 2350   BILITOT 0.2 (L) 04/23/2022 2350   GFRNONAA >60 04/29/2022 0754   GFRAA >60 08/03/2018 1514   Lipase     Component Value Date/Time   LIPASE 40 04/17/2022 1018    Studies/Results: No results  found.  Anti-infectives: Anti-infectives (From admission, onward)    Start     Dose/Rate Route Frequency Ordered Stop   04/24/22 1130  cefoTEtan (CEFOTAN) 2 g in sodium chloride 0.9 % 100 mL IVPB        2 g 200 mL/hr over 30 Minutes Intravenous On call to O.R. 04/24/22 1119 04/24/22 1322   04/24/22 1120  sodium chloride 0.9 % with cefoTEtan (CEFOTAN) ADS Med       Note to Pharmacy: Mendel Corning B: cabinet override      04/24/22 1120 04/24/22 1301   04/18/22 1100  cefoTEtan (CEFOTAN) 2 g in sodium chloride 0.9 % 100 mL IVPB        2 g 200 mL/hr over 30 Minutes Intravenous On call to O.R. 04/18/22 1053 04/19/22 0840   04/18/22 1059  sodium chloride 0.9 % with cefoTEtan (CEFOTAN) ADS Med       Note to Pharmacy: Harden Mo E: cabinet override      04/18/22 1059 04/18/22 1243        Assessment/Plan SBO H/o gastric bypass 2017 Dr. Excell Seltzer, history of laparoscopic hysterectomy - CT 10/13 w/ SBO and TP poster pelvis. Mild twisting of mesentery within RLQ. Small HH -Admitted to hospitalist service 10/13 through 10/20 for SBO managed conservatively.  NG tube removed during admission but obstruction did not completely resolve.  She left AMA 10/20 after recommendation for continued n.p.o. status and NG tube replacement -CT 10/24 and 10/26 done without oral contrast showing diffuse small bowel distention concerning for ileus versus SBO however favor pSBO given history   POD#11 s/p exploratory laparotomy/ small bowel resection/ lysis of adhesions 04/18/22 - Dr. Brantley Stage -Intraop findings of Small bowel stricture POD#5 s/p ex lap abdominal closure w/ retention sutures for wound dehiscence 04/24/22 Dr. Grandville Silos  -Surgical path without IBD or neoplasm. Fibrous adhesion and benign mesothelial cell proliferation - Continue abdominal binder at all times - BID wet to dry dressing changes around retention sutures. HH at d/c. RN to teach patient/family dressing changes.  - Patient is HDS, tolerating  diet without n/v, having bowel function, pain well controlled with po medications, voiding, mobilizing and felt stable for d/c from our standpoint. Discussed with TRH. Discussed discharge instructions, restrictions and return/call back precautions with patient.    FEN: Soft, bid miralax and colace. IVF per primary  ID: cefotetan 11/2. None currently.  VTE: lovenox    Alcohol use  Tobacco use DM GERD COPD Afib  HTN   LOS: 12 days    Jillyn Ledger , [facility] 04/29/2022, 10:06 AM Please see Amion for pager number during day hours 7:00am-4:30pm

## 2022-04-29 NOTE — ED Notes (Signed)
Pt no longer wanted to wait

## 2022-04-29 NOTE — ED Notes (Signed)
Unable to access peripheral IV at triage despite multiple attempts .

## 2022-04-29 NOTE — Discharge Summary (Signed)
Physician Discharge Summary  TRINE FREAD JSH:[id number] DOB: January 11, 1966 DOA: 04/17/2022  PCP: [name], MD  Admit date: 04/17/2022 Discharge date: 04/29/2022  Admitted From: home Discharge disposition: home   Recommendations for Outpatient Follow-Up:   Home health for wound care: BID wet to dry dressing changes around retention sutures. HH at d/c. RN to teach patient/family dressing change  Close PCP follow up General surgery follow up   Discharge Diagnosis:   Principal Problem:   Small bowel obstruction ([place]) Active Problems:   Hypokalemia   Polysubstance abuse ([place])   Schizoaffective disorder, bipolar type ([place])   Type 2 diabetes mellitus ([place])   Essential hypertension   COPD (chronic obstructive pulmonary disease) (HCC)   Seizure ([place])   GERD (gastroesophageal reflux disease)   Memory loss    Discharge Condition: Improved.  Diet recommendation: soft    Code status: Full.   History of Present Illness:   [name] is a 56 y.o. female with medical history significant of  Schizo affective d/o , COPD, Anemia, GERD, HTN, gastric bypass in 2017 by Dr. Hillary Bow recent interim history of  seizure s/p fall diagnosed 8/23.  In addition to history of  admission 7/23-8/23 for suicide attempt complicated by aspiration pneumonia with acute respiratory failure that was vent dependent. Had trach placed during this stay.  During this admission patient also developed colonic ileus and was seen by GI and placed on Linzess and lactulose.She was recently admitted on on 10/13-10/21 for SBO and was treated conservatively with small bowel protocol. She had persistent obstruction and left AMA on 10/21. She now presents to ED with complaints of abdominal pain. She states her pain never went away after she left the hospital on 10/21 and it got acutely worse last night. It was 7/9 last night and then 9/9 this AM. She describes it as stabbing, aching and moves around.  Pain is generalized and radiates to the lower back. Nothing makes it better or worse. Pain is constant. She had a runny BM this morning. She has not had a normal BM since discharge and they have all been running. She states she may have blood in her stool. She has been eating and has no N/V.    She smokes 1.5 PPD and 40 1/2 ounces/day    Her daughter tells me she has not been taking any of her medication.    Hospital Course by Problem:   Small bowel obstruction/sp laparotomy/lysis of adhesions -General surgery following- + flatus, tolerating clear diet -pain control per GS- off PCA 11/2-- back to the OR +BM x 2 -patient ambulating   Hypokalemia -repleted   Polysubstance abuse (H[facility]) +cocaine and alcohol abuse -UDS never done   Schizoaffective disorder, bipolar type ([place]) History of suicide attempt in 12/2021 Has not been taking medication per family No SI/HI.  -outpatient follow up   Type 2 diabetes mellitus (HCC) A1C of 6.5 this month -diet controlled   Essential hypertension -controlled   COPD (chronic obstructive pulmonary disease) (HCC) No s/s of exacerbation Continue SABA prn and symbicort    Reported Seizure ([place]) Seizure precautions On no medication,  ? Alcohol withdrawal  Not on vimpat and has not been on this. Verified with family    GERD (gastroesophageal reflux disease) -PPI   Memory loss ? Anoxic brain injury - placed referral to neurology up on d/c    Medical Consultants:   [place]   Discharge Exam:   Vitals:   04/28/22 1914  04/29/22 0810  BP: (!) 141/87 127/81  Pulse: (!) 108 (!) 106  Resp: 20 15  Temp: 97.8 F (36.6 C) 98.2 F (36.8 C)  SpO2: 97% 96%   Vitals:   04/28/22 0815 04/28/22 1643 04/28/22 1914 04/29/22 0810  BP: 137/85 138/86 (!) 141/87 127/81  Pulse: 100 (!) 107 (!) 108 (!) 106  Resp:  '18 20 15  '$ Temp: 98.6 F (37 C) 98 F (36.7 C) 97.8 F (36.6 C) 98.2 F (36.8 C)  TempSrc: Oral Oral Oral Oral  SpO2: 96% 97% 97% 96%   Weight:      Height:        General exam: Appears calm and comfortable. Up walking   The results of significant diagnostics from this hospitalization (including imaging, microbiology, ancillary and laboratory) are listed below for reference.     Procedures and Diagnostic Studies:   DG Abdomen 1 View  Result Date: 04/17/2022 CLINICAL DATA:  Status post NG placement. EXAM: ABDOMEN - 1 VIEW COMPARISON:  Earlier radiograph dated 04/17/2022. FINDINGS: Interval advancement of the enteric tube with tip in the left upper abdomen, likely in the region of the gastric body. Dilated air-filled loops of small bowel again noted. IMPRESSION: Interval advancement of the enteric tube with tip in the region of the gastric body. Electronically Signed   By: Anner Crete M.D.   On: 04/17/2022 23:58   DG Abd Portable 1V  Result Date: 04/17/2022 CLINICAL DATA:  Enteric catheter placement EXAM: PORTABLE ABDOMEN - 1 VIEW COMPARISON:  04/11/2022 FINDINGS: Frontal view of the lower chest and upper abdomen demonstrates enteric catheter passing below diaphragm, tip projecting over the gastric body and side port projecting at the gastroesophageal junction. Persistent gaseous distention of the bowel, with slight decrease in caliber since prior x-ray. Lung bases are clear. IMPRESSION: 1. Enteric catheter tip projecting over the gastric body. Electronically Signed   By: Randa Ngo M.D.   On: 04/17/2022 16:09   CT ABDOMEN PELVIS W CONTRAST  Result Date: 04/17/2022 CLINICAL DATA:  Small bowel obstruction. EXAM: CT ABDOMEN AND PELVIS WITH CONTRAST TECHNIQUE: Multidetector CT imaging of the abdomen and pelvis was performed using the standard protocol following bolus administration of intravenous contrast. RADIATION DOSE REDUCTION: This exam was performed according to the departmental dose-optimization program which includes automated exposure control, adjustment of the mA and/or kV according to patient size and/or use  of iterative reconstruction technique. COMPARISON:  04/15/2022 FINDINGS: Lower chest: Unremarkable. Hepatobiliary: Insert normal infused liver There is no evidence for gallstones, gallbladder wall thickening, or pericholecystic fluid. No intrahepatic or extrahepatic biliary dilation. Pancreas: No focal mass lesion. No dilatation of the main duct. No intraparenchymal cyst. No peripancreatic edema. Spleen: No splenomegaly. No focal mass lesion. Adrenals/Urinary Tract: No adrenal nodule or mass. Kidneys unremarkable. No evidence for hydroureter. The urinary bladder appears normal for the degree of distention. Stomach/Bowel: Status post gastric bypass. Similar appearance of diffuse small bowel distension. Terminal ileum is decompressed. There is mesenteric edema in the lower abdomen and pelvis with scattered areas of interloop mesenteric fluid. Small bowel loop in the left pelvis shows fecalized enteric contents and is distended up to 3.9 cm diameter. Multiple nondilated loops in the right abdomen show apparent mural edema/thickening. Old no evidence for pneumatosis. Colon is diffusely distended with gas and stool. Vascular/Lymphatic: There is mild atherosclerotic calcification of the abdominal aorta without aneurysm. There is no gastrohepatic or hepatoduodenal ligament lymphadenopathy. No retroperitoneal or mesenteric lymphadenopathy. No pelvic sidewall lymphadenopathy. Reproductive: No adnexal mass.  Other: Small volume free fluid seen in the pelvis. Musculoskeletal: Small bilateral groin hernias contain only fat. No worrisome lytic or sclerotic osseous abnormality. T9 compression deformity is stable. IMPRESSION: 1. Similar appearance of diffuse small bowel distension with mesenteric edema in the lower abdomen and pelvis with scattered areas of interloop mesenteric fluid. Small bowel loop in the left pelvis shows fecalized enteric contents and is distended up to 3.9 cm diameter. Multiple nondilated loops in the right  abdomen show apparent mural edema/thickening. As before, assessment is limited by lack of intra-abdominal fat. Imaging features could be related to ileus although distal small bowel obstruction cannot be excluded. No evidence for pneumatosis in the small bowel or colon. No intraperitoneal free air. No evidence for intraperitoneal abscess. 2. Small volume free fluid in the pelvis. 3. Status post gastric bypass. 4. Stable T9 compression deformity. 5.  Aortic Atherosclerosis (ICD10-I70.0). Electronically Signed   By: Misty Stanley M.D.   On: 04/17/2022 13:02     Labs:   Basic Metabolic Panel: Recent Labs  Lab 04/23/22 2350 04/24/22 0421 04/25/22 0800 04/26/22 0447 04/27/22 0414 04/29/22 0754  NA 138 138 135 137 133* 134*  K 3.8 3.5 3.7 3.4* 4.9 3.7  CL 100 99 95* 98 99 95*  CO2 '28 30 28 28 26 26  '$ GLUCOSE 136* 116* 127* 220* 246* 193*  BUN <5* <5* <5* <5* <5* <5*  CREATININE 0.46 0.42* 0.42* 0.40* 0.53 0.43*  CALCIUM 8.5* 8.4* 8.1* 8.3* 8.2* 8.7*  MG 1.7  --   --  1.7 1.8  --   PHOS 3.8  --   --   --   --   --    GFR Estimated Creatinine Clearance: 61.1 mL/min (A) (by C-G formula based on SCr of 0.43 mg/dL (L)). Liver Function Tests: Recent Labs  Lab 04/23/22 2350  AST 12*  ALT 12  ALKPHOS 82  BILITOT 0.2*  PROT 6.3*  ALBUMIN 2.5*   No results for input(s): "LIPASE", "AMYLASE" in the last 168 hours. No results for input(s): "AMMONIA" in the last 168 hours. Coagulation profile No results for input(s): "INR", "PROTIME" in the last 168 hours.  CBC: Recent Labs  Lab 04/24/22 0421 04/25/22 0800 04/26/22 0447 04/27/22 0414 04/29/22 0754  WBC 10.2 11.0* 9.4 6.6 7.9  HGB 12.3 13.6 11.8* 11.7* 12.4  HCT 36.4 40.4 35.6* 35.7* 37.7  MCV 88.1 89.2 87.9 89.3 88.7  PLT 240 232 245 253 359   Cardiac Enzymes: No results for input(s): "CKTOTAL", "CKMB", "CKMBINDEX", "TROPONINI" in the last 168 hours. BNP: Invalid input(s): "POCBNP" CBG: Recent Labs  Lab 04/28/22 0811  04/28/22 1123 04/28/22 1641 04/28/22 2102 04/29/22 0808  GLUCAP 154* 95 149* 98 166*   D-Dimer No results for input(s): "DDIMER" in the last 72 hours. Hgb A1c No results for input(s): "HGBA1C" in the last 72 hours. Lipid Profile No results for input(s): "CHOL", "HDL", "LDLCALC", "TRIG", "CHOLHDL", "LDLDIRECT" in the last 72 hours. Thyroid function studies No results for input(s): "TSH", "T4TOTAL", "T3FREE", "THYROIDAB" in the last 72 hours.  Invalid input(s): "FREET3" Anemia work up No results for input(s): "VITAMINB12", "FOLATE", "FERRITIN", "TIBC", "IRON", "RETICCTPCT" in the last 72 hours. Microbiology No results found for this or any previous visit (from the past 240 hour(s)).   Discharge Instructions:   Discharge Instructions     Ambulatory referral to Neurology   Complete by: As directed    An appointment is requested in approximately: 2 weeks   Discharge instructions   Complete  by: As directed    Soft diet continue abdominal binder at all times Home health   Discharge wound care:   Complete by: As directed    continue abdominal binder at all times bid wet to dry dressing changes around retention sutures   Increase activity slowly   Complete by: As directed       Allergies as of 04/29/2022       Reactions   Iron Dextran Shortness Of Breath, Anxiety   Aspirin Nausea And Vomiting, Other (See Comments)   Ok to take tylenol or ibuprofen        Medication List     STOP taking these medications    amLODipine 5 MG tablet Commonly known as: NORVASC   ARIPiprazole 5 MG tablet Commonly known as: ABILIFY   ascorbic acid 500 MG tablet Commonly known as: VITAMIN C   gabapentin 300 MG capsule Commonly known as: NEURONTIN   ibuprofen 800 MG tablet Commonly known as: ADVIL   insulin aspart 100 UNIT/ML injection Commonly known as: novoLOG   lactulose 10 GM/15ML solution Commonly known as: CHRONULAC   metFORMIN 500 MG tablet Commonly known as:  Glucophage   PARoxetine 20 MG tablet Commonly known as: PAXIL   QUEtiapine 400 MG tablet Commonly known as: SEROQUEL       TAKE these medications    acetaminophen 500 MG tablet Commonly known as: TYLENOL Take 2 tablets (1,000 mg total) by mouth every 6 (six) hours as needed for mild pain.   albuterol 108 (90 Base) MCG/ACT inhaler Commonly known as: ProAir HFA Inhale 2 puffs into the lungs every 6 (six) hours as needed for wheezing or shortness of breath.   bisacodyl 10 MG suppository Commonly known as: DULCOLAX Place 1 suppository (10 mg total) rectally daily as needed for moderate constipation.   docusate sodium 100 MG capsule Commonly known as: COLACE Take 1 capsule (100 mg total) by mouth 2 (two) times daily.   folic acid 1 MG tablet Commonly known as: FOLVITE Take 1 tablet (1 mg total) by mouth daily.   ipratropium-albuterol 0.5-2.5 (3) MG/3ML Soln Commonly known as: DUONEB Take 3 mLs by nebulization every 6 (six) hours as needed.   methocarbamol 750 MG tablet Commonly known as: ROBAXIN Take 1 tablet (750 mg total) by mouth every 6 (six) hours as needed for muscle spasms.   nicotine 21 mg/24hr patch Commonly known as: NICODERM CQ - dosed in mg/24 hours Place 1 patch (21 mg total) onto the skin daily. Start taking on: April 30, 2022   ondansetron 4 MG tablet Commonly known as: ZOFRAN Take 1 tablet (4 mg total) by mouth every 6 (six) hours as needed for nausea.   oxyCODONE 5 MG immediate release tablet Commonly known as: Oxy IR/ROXICODONE Take 1-2 tablets (5-10 mg total) by mouth every 4 (four) hours as needed for moderate pain or severe pain (5 mg for 4-6/10 pain. 10 mg for >6/10 pain).   pantoprazole 40 MG tablet Commonly known as: Protonix Take 1 tablet (40 mg total) by mouth daily.   traMADol 50 MG tablet Commonly known as: ULTRAM Take 1 tablet (50 mg total) by mouth every 12 (twelve) hours as needed for severe pain (pain not relieved by  oxycodone).               Discharge Care Instructions  (From admission, onward)           Start     Ordered   04/29/22 0000  Discharge wound  care:       Comments: continue abdominal binder at all times bid wet to dry dressing changes around retention sutures   04/29/22 1010            Follow-up Information     [name], MD Follow up.   Specialty: Internal Medicine Why: For follow up Contact information: [place] [id number] [phone]         Erroll Luna, MD Follow up on 05/19/2022.   Specialty: General Surgery Why: 310pm. Please arrive 30 minutes prior to your appointment for paperwork. Please bring a copy of your photo ID and insurance card. Contact information: [address] [place] [address] [phone]         [place], Viola Follow up.   Specialty: [place] Why: [facility] Cousin[facility] arranged- they will contact you to schedule initial visit ([place] start of care for 05/01/22) Contact information: [place] [id number] [phone]                  Time coordinating discharge: 46 min  Signed:  Geradine Girt DO  [facility] 04/29/2022, 2:52 PM

## 2022-04-30 NOTE — ED Notes (Signed)
PATIENT WENT OUT TO SMOKE NEVER CAME  BACK IN . LEFT AMA

## 2022-05-04 ENCOUNTER — Emergency Department (HOSPITAL_COMMUNITY): Payer: Medicare Other

## 2022-05-04 ENCOUNTER — Inpatient Hospital Stay (HOSPITAL_COMMUNITY)
Admission: EM | Admit: 2022-05-04 | Discharge: 2022-05-06 | DRG: 862 | Disposition: A | Discharge status: Home Health | Payer: Medicare Other | Attending: Family Medicine | Admitting: Family Medicine

## 2022-05-04 DIAGNOSIS — F32A Depression, unspecified: Secondary | ICD-10-CM | POA: Diagnosis present

## 2022-05-04 DIAGNOSIS — J449 Chronic obstructive pulmonary disease, unspecified: Secondary | ICD-10-CM | POA: Diagnosis not present

## 2022-05-04 DIAGNOSIS — R1084 Generalized abdominal pain: Secondary | ICD-10-CM | POA: Diagnosis not present

## 2022-05-04 DIAGNOSIS — E785 Hyperlipidemia, unspecified: Secondary | ICD-10-CM | POA: Diagnosis present

## 2022-05-04 DIAGNOSIS — E039 Hypothyroidism, unspecified: Secondary | ICD-10-CM | POA: Diagnosis not present

## 2022-05-04 DIAGNOSIS — G8929 Other chronic pain: Secondary | ICD-10-CM | POA: Diagnosis not present

## 2022-05-04 DIAGNOSIS — F10939 Alcohol use, unspecified with withdrawal, unspecified: Secondary | ICD-10-CM | POA: Diagnosis present

## 2022-05-04 DIAGNOSIS — E1140 Type 2 diabetes mellitus with diabetic neuropathy, unspecified: Secondary | ICD-10-CM | POA: Diagnosis present

## 2022-05-04 DIAGNOSIS — L03311 Cellulitis of abdominal wall: Secondary | ICD-10-CM | POA: Diagnosis present

## 2022-05-04 DIAGNOSIS — Z720 Tobacco use: Secondary | ICD-10-CM | POA: Diagnosis present

## 2022-05-04 DIAGNOSIS — Z9884 Bariatric surgery status: Secondary | ICD-10-CM

## 2022-05-04 DIAGNOSIS — Z833 Family history of diabetes mellitus: Secondary | ICD-10-CM | POA: Diagnosis not present

## 2022-05-04 DIAGNOSIS — Z9151 Personal history of suicidal behavior: Secondary | ICD-10-CM | POA: Diagnosis not present

## 2022-05-04 DIAGNOSIS — F17210 Nicotine dependence, cigarettes, uncomplicated: Secondary | ICD-10-CM | POA: Diagnosis present

## 2022-05-04 DIAGNOSIS — J441 Chronic obstructive pulmonary disease with (acute) exacerbation: Secondary | ICD-10-CM | POA: Diagnosis present

## 2022-05-04 DIAGNOSIS — K529 Noninfective gastroenteritis and colitis, unspecified: Secondary | ICD-10-CM | POA: Diagnosis present

## 2022-05-04 DIAGNOSIS — T68XXXA Hypothermia, initial encounter: Secondary | ICD-10-CM | POA: Diagnosis not present

## 2022-05-04 DIAGNOSIS — M549 Dorsalgia, unspecified: Secondary | ICD-10-CM | POA: Diagnosis not present

## 2022-05-04 DIAGNOSIS — R509 Fever, unspecified: Secondary | ICD-10-CM | POA: Diagnosis not present

## 2022-05-04 DIAGNOSIS — Y900 Blood alcohol level of less than 20 mg/100 ml: Secondary | ICD-10-CM | POA: Diagnosis present

## 2022-05-04 DIAGNOSIS — F1920 Other psychoactive substance dependence, uncomplicated: Secondary | ICD-10-CM | POA: Diagnosis not present

## 2022-05-04 DIAGNOSIS — Z79899 Other long term (current) drug therapy: Secondary | ICD-10-CM

## 2022-05-04 DIAGNOSIS — I878 Other specified disorders of veins: Secondary | ICD-10-CM | POA: Diagnosis not present

## 2022-05-04 DIAGNOSIS — S22070A Wedge compression fracture of T9-T10 vertebra, initial encounter for closed fracture: Secondary | ICD-10-CM | POA: Diagnosis not present

## 2022-05-04 DIAGNOSIS — F1410 Cocaine abuse, uncomplicated: Secondary | ICD-10-CM | POA: Diagnosis present

## 2022-05-04 DIAGNOSIS — F419 Anxiety disorder, unspecified: Secondary | ICD-10-CM | POA: Diagnosis present

## 2022-05-04 DIAGNOSIS — D75839 Thrombocytosis, unspecified: Secondary | ICD-10-CM | POA: Diagnosis present

## 2022-05-04 DIAGNOSIS — E119 Type 2 diabetes mellitus without complications: Secondary | ICD-10-CM

## 2022-05-04 DIAGNOSIS — Z825 Family history of asthma and other chronic lower respiratory diseases: Secondary | ICD-10-CM

## 2022-05-04 DIAGNOSIS — I959 Hypotension, unspecified: Secondary | ICD-10-CM | POA: Diagnosis not present

## 2022-05-04 DIAGNOSIS — Z8249 Family history of ischemic heart disease and other diseases of the circulatory system: Secondary | ICD-10-CM | POA: Diagnosis not present

## 2022-05-04 DIAGNOSIS — K6389 Other specified diseases of intestine: Secondary | ICD-10-CM | POA: Diagnosis not present

## 2022-05-04 DIAGNOSIS — Z888 Allergy status to other drugs, medicaments and biological substances status: Secondary | ICD-10-CM

## 2022-05-04 DIAGNOSIS — R000 Tachycardia, unspecified: Secondary | ICD-10-CM | POA: Diagnosis not present

## 2022-05-04 DIAGNOSIS — R0902 Hypoxemia: Secondary | ICD-10-CM | POA: Diagnosis not present

## 2022-05-04 DIAGNOSIS — I10 Essential (primary) hypertension: Secondary | ICD-10-CM | POA: Diagnosis not present

## 2022-05-04 DIAGNOSIS — A419 Sepsis, unspecified organism: Secondary | ICD-10-CM | POA: Diagnosis present

## 2022-05-04 DIAGNOSIS — T8141XA Infection following a procedure, superficial incisional surgical site, initial encounter: Principal | ICD-10-CM | POA: Diagnosis present

## 2022-05-04 DIAGNOSIS — K219 Gastro-esophageal reflux disease without esophagitis: Secondary | ICD-10-CM | POA: Diagnosis present

## 2022-05-04 DIAGNOSIS — L0390 Cellulitis, unspecified: Secondary | ICD-10-CM

## 2022-05-04 DIAGNOSIS — K56609 Unspecified intestinal obstruction, unspecified as to partial versus complete obstruction: Secondary | ICD-10-CM | POA: Diagnosis not present

## 2022-05-04 DIAGNOSIS — Y839 Surgical procedure, unspecified as the cause of abnormal reaction of the patient, or of later complication, without mention of misadventure at the time of the procedure: Secondary | ICD-10-CM | POA: Diagnosis present

## 2022-05-04 DIAGNOSIS — R413 Other amnesia: Secondary | ICD-10-CM | POA: Diagnosis present

## 2022-05-04 DIAGNOSIS — F250 Schizoaffective disorder, bipolar type: Secondary | ICD-10-CM | POA: Diagnosis present

## 2022-05-04 DIAGNOSIS — T8149XA Infection following a procedure, other surgical site, initial encounter: Secondary | ICD-10-CM | POA: Diagnosis present

## 2022-05-04 DIAGNOSIS — A09 Infectious gastroenteritis and colitis, unspecified: Secondary | ICD-10-CM | POA: Diagnosis not present

## 2022-05-04 DIAGNOSIS — R079 Chest pain, unspecified: Secondary | ICD-10-CM | POA: Diagnosis not present

## 2022-05-04 LAB — RAPID URINE DRUG SCREEN, HOSP PERFORMED
Amphetamines: NOT DETECTED
Barbiturates: NOT DETECTED
Benzodiazepines: NOT DETECTED
Cocaine: NOT DETECTED
Opiates: NOT DETECTED
Tetrahydrocannabinol: NOT DETECTED

## 2022-05-04 LAB — CBC WITH DIFFERENTIAL/PLATELET
Abs Immature Granulocytes: 0.09 10*3/uL — ABNORMAL HIGH (ref 0.00–0.07)
Basophils Absolute: 0.1 10*3/uL (ref 0.0–0.1)
Basophils Relative: 1 %
Eosinophils Absolute: 0.5 10*3/uL (ref 0.0–0.5)
Eosinophils Relative: 3 %
HCT: 40.6 % (ref 36.0–46.0)
Hemoglobin: 13.4 g/dL (ref 12.0–15.0)
Immature Granulocytes: 1 %
Lymphocytes Relative: 10 %
Lymphs Abs: 1.9 10*3/uL (ref 0.7–4.0)
MCH: 29.2 pg (ref 26.0–34.0)
MCHC: 33 g/dL (ref 30.0–36.0)
MCV: 88.5 fL (ref 80.0–100.0)
Monocytes Absolute: 0.8 10*3/uL (ref 0.1–1.0)
Monocytes Relative: 4 %
Neutro Abs: 15.6 10*3/uL — ABNORMAL HIGH (ref 1.7–7.7)
Neutrophils Relative %: 81 %
Platelets: 565 10*3/uL — ABNORMAL HIGH (ref 150–400)
RBC: 4.59 MIL/uL (ref 3.87–5.11)
RDW: 14.2 % (ref 11.5–15.5)
WBC: 18.9 10*3/uL — ABNORMAL HIGH (ref 4.0–10.5)
nRBC: 0 % (ref 0.0–0.2)

## 2022-05-04 LAB — URINALYSIS, ROUTINE W REFLEX MICROSCOPIC
Bilirubin Urine: NEGATIVE
Glucose, UA: NEGATIVE mg/dL
Hgb urine dipstick: NEGATIVE
Ketones, ur: NEGATIVE mg/dL
Leukocytes,Ua: NEGATIVE
Nitrite: NEGATIVE
Protein, ur: NEGATIVE mg/dL
Specific Gravity, Urine: 1.009 (ref 1.005–1.030)
pH: 5 (ref 5.0–8.0)

## 2022-05-04 LAB — COMPREHENSIVE METABOLIC PANEL
ALT: 26 U/L (ref 0–44)
AST: 12 U/L — ABNORMAL LOW (ref 15–41)
Albumin: 2.8 g/dL — ABNORMAL LOW (ref 3.5–5.0)
Alkaline Phosphatase: 131 U/L — ABNORMAL HIGH (ref 38–126)
Anion gap: 13 (ref 5–15)
BUN: <5 mg/dL — ABNORMAL LOW (ref 6–20)
CO2: 28 mmol/L (ref 22–32)
Calcium: 8.7 mg/dL — ABNORMAL LOW (ref 8.9–10.3)
Chloride: 98 mmol/L (ref 98–111)
Creatinine, Ser: 0.36 mg/dL — ABNORMAL LOW (ref 0.44–1.00)
GFR, Estimated: >60 mL/min (ref >60)
Glucose, Bld: 128 mg/dL — ABNORMAL HIGH (ref 70–99)
Potassium: 3.9 mmol/L (ref 3.5–5.1)
Sodium: 139 mmol/L (ref 135–145)
Total Bilirubin: 0.3 mg/dL (ref 0.3–1.2)
Total Protein: 7 g/dL (ref 6.5–8.1)

## 2022-05-04 LAB — APTT: aPTT: 29 seconds (ref 24–36)

## 2022-05-04 LAB — LACTIC ACID, PLASMA: Lactic Acid, Venous: 0.9 mmol/L (ref 0.5–1.9)

## 2022-05-04 LAB — I-STAT BETA HCG BLOOD, ED (MC, WL, AP ONLY): I-stat hCG, quantitative: <5 m[IU]/mL (ref <5)

## 2022-05-04 LAB — PROTIME-INR
INR: 0.9 (ref 0.8–1.2)
Prothrombin Time: 12.1 seconds (ref 11.4–15.2)

## 2022-05-04 LAB — ETHANOL: Alcohol, Ethyl (B): <10 mg/dL (ref <10)

## 2022-05-04 MED ORDER — FENTANYL CITRATE PF 50 MCG/ML IJ SOSY
50.0000 ug | PREFILLED_SYRINGE | Freq: Once | INTRAMUSCULAR | Status: AC
  Administered 2022-05-04: 50 ug via INTRAVENOUS
  Filled 2022-05-04: qty 1

## 2022-05-04 MED ORDER — OXYCODONE HCL 5 MG PO TABS
5.0000 mg | ORAL_TABLET | ORAL | Status: DC | PRN
  Administered 2022-05-04 – 2022-05-06 (×8): 10 mg via ORAL
  Filled 2022-05-04 (×8): qty 2

## 2022-05-04 MED ORDER — ACETAMINOPHEN 650 MG RE SUPP: 650.0000 mg | Freq: Four times a day (QID) | RECTAL | Status: DC | PRN

## 2022-05-04 MED ORDER — NICOTINE 21 MG/24HR TD PT24
21.0000 mg | MEDICATED_PATCH | Freq: Every day | TRANSDERMAL | Status: DC
  Administered 2022-05-04 – 2022-05-06 (×3): 21 mg via TRANSDERMAL
  Filled 2022-05-04 (×3): qty 1

## 2022-05-04 MED ORDER — LORAZEPAM 2 MG/ML IJ SOLN
0.0000 mg | Freq: Four times a day (QID) | INTRAMUSCULAR | Status: DC
  Administered 2022-05-05: 2 mg via INTRAVENOUS
  Filled 2022-05-04 (×2): qty 1

## 2022-05-04 MED ORDER — LORAZEPAM 1 MG PO TABS: 1.0000 mg | ORAL_TABLET | ORAL | Status: DC | PRN

## 2022-05-04 MED ORDER — PIPERACILLIN-TAZOBACTAM 3.375 G IVPB
3.3750 g | Freq: Three times a day (TID) | INTRAVENOUS | Status: DC
  Administered 2022-05-04 – 2022-05-05 (×5): 3.375 g via INTRAVENOUS
  Filled 2022-05-04 (×5): qty 50

## 2022-05-04 MED ORDER — MORPHINE SULFATE (PF) 2 MG/ML IV SOLN
2.0000 mg | INTRAVENOUS | Status: DC | PRN
  Administered 2022-05-04 (×2): 2 mg via INTRAVENOUS
  Filled 2022-05-04 (×2): qty 1

## 2022-05-04 MED ORDER — ALBUTEROL SULFATE (2.5 MG/3ML) 0.083% IN NEBU: 2.5000 mg | INHALATION_SOLUTION | Freq: Four times a day (QID) | RESPIRATORY_TRACT | Status: DC | PRN

## 2022-05-04 MED ORDER — POLYETHYLENE GLYCOL 3350 17 G PO PACK
17.0000 g | PACK | Freq: Every day | ORAL | Status: DC
  Administered 2022-05-04 – 2022-05-06 (×3): 17 g via ORAL
  Filled 2022-05-04 (×3): qty 1

## 2022-05-04 MED ORDER — ENOXAPARIN SODIUM 40 MG/0.4ML IJ SOSY
40.0000 mg | PREFILLED_SYRINGE | INTRAMUSCULAR | Status: DC
  Administered 2022-05-04 – 2022-05-06 (×3): 40 mg via SUBCUTANEOUS
  Filled 2022-05-04 (×4): qty 0.4

## 2022-05-04 MED ORDER — LORAZEPAM 2 MG/ML IJ SOLN
1.0000 mg | INTRAMUSCULAR | Status: DC | PRN
  Administered 2022-05-04: 2 mg via INTRAVENOUS
  Filled 2022-05-04: qty 1

## 2022-05-04 MED ORDER — ACETAMINOPHEN 500 MG PO TABS
1000.0000 mg | ORAL_TABLET | Freq: Three times a day (TID) | ORAL | Status: DC
  Administered 2022-05-04 – 2022-05-06 (×6): 1000 mg via ORAL
  Filled 2022-05-04 (×6): qty 2

## 2022-05-04 MED ORDER — ACETAMINOPHEN 325 MG PO TABS: 650.0000 mg | ORAL_TABLET | Freq: Four times a day (QID) | ORAL | Status: DC | PRN

## 2022-05-04 MED ORDER — SODIUM CHLORIDE 0.9% FLUSH
3.0000 mL | Freq: Two times a day (BID) | INTRAVENOUS | Status: DC
  Administered 2022-05-04 – 2022-05-06 (×5): 3 mL via INTRAVENOUS

## 2022-05-04 MED ORDER — THIAMINE MONONITRATE 100 MG PO TABS
100.0000 mg | ORAL_TABLET | Freq: Every day | ORAL | Status: DC
  Administered 2022-05-04 – 2022-05-06 (×3): 100 mg via ORAL
  Filled 2022-05-04 (×3): qty 1

## 2022-05-04 MED ORDER — FOLIC ACID 1 MG PO TABS
1.0000 mg | ORAL_TABLET | Freq: Every day | ORAL | Status: DC
  Administered 2022-05-04 – 2022-05-06 (×3): 1 mg via ORAL
  Filled 2022-05-04 (×3): qty 1

## 2022-05-04 MED ORDER — LORAZEPAM 2 MG/ML IJ SOLN: 0.0000 mg | Freq: Two times a day (BID) | INTRAMUSCULAR | Status: DC

## 2022-05-04 MED ORDER — DOCUSATE SODIUM 100 MG PO CAPS
100.0000 mg | ORAL_CAPSULE | Freq: Two times a day (BID) | ORAL | Status: DC
  Administered 2022-05-04 – 2022-05-06 (×5): 100 mg via ORAL
  Filled 2022-05-04 (×5): qty 1

## 2022-05-04 MED ORDER — METHOCARBAMOL 500 MG PO TABS
500.0000 mg | ORAL_TABLET | Freq: Four times a day (QID) | ORAL | Status: DC
  Administered 2022-05-04 – 2022-05-06 (×7): 500 mg via ORAL
  Filled 2022-05-04 (×8): qty 1

## 2022-05-04 MED ORDER — THIAMINE HCL 100 MG/ML IJ SOLN
100.0000 mg | Freq: Every day | INTRAMUSCULAR | Status: DC
  Filled 2022-05-04: qty 2

## 2022-05-04 MED ORDER — IOHEXOL 350 MG/ML SOLN
60.0000 mL | Freq: Once | INTRAVENOUS | Status: AC | PRN
  Administered 2022-05-04: 60 mL via INTRAVENOUS

## 2022-05-04 NOTE — ED Notes (Signed)
Mutliple IV attempts unsuccessful by 2 RN, IV Team notified.

## 2022-05-04 NOTE — ED Triage Notes (Signed)
Pt brought to ED by [facility] with c/o abdominal pain. Has hx of SBO on 04/22/2022 and had resection performed with wound dehiscing of surgical site shortly afterwards. EMS states that surgical appears red and warm to touch with some drainage.   EMS Vitals BP 154/90 HR 110 RR 22 SPO2 95% RA

## 2022-05-04 NOTE — ED Notes (Signed)
Lunch provided. Pt ate chicken and a few bites of her sides.

## 2022-05-04 NOTE — ED Provider Notes (Signed)
[facility] EMERGENCY DEPARTMENT Provider Note   CSN: [id number] Arrival date & time: 05/04/22  0342     History  Chief Complaint  Patient presents with   Abdominal Pain    [name] is a 56 y.o. female.  HPIPatient with extensive medical history presents with increasing abdominal pain.  She reports over the past day she has been having increasing pain.  No fevers.  No nausea or vomiting.  She reports normal bowel movements. Patient reports recent abdominal surgery.    Past Medical History:  Diagnosis Date   Abdominal pain    Accidental drug overdose April 2013   Anxiety    Atrial fibrillation ([place]) 09/29/11   converted spontaneously   Chronic back pain    Chronic knee pain    Chronic nausea    Chronic pain    COPD (chronic obstructive pulmonary disease) ([place])    Depression    Diabetes mellitus    states her doctor took her off all DM meds in past month   Diabetic neuropathy ([place])    Dyspnea    with exertion    GERD (gastroesophageal reflux disease)    Headache(784.0)    migraines    HTN (hypertension)    not on meds since in a year    Hyperlipidemia    Hypothyroidism    not on meds in a while    Mental disorder    Bipolar and schizophrenic   Requires supplemental oxygen    as needed per patient    Schizophrenia ([place])    Schizophrenia, acute ([place]) 11/13/2017   Tobacco abuse     Home Medications Prior to Admission medications   Medication Sig Start Date End Date Taking? Authorizing Provider  acetaminophen (TYLENOL) 500 MG tablet Take 2 tablets (1,000 mg total) by mouth every 6 (six) hours as needed for mild pain. 04/29/22   Geradine Girt, DO  albuterol (PROAIR HFA) 108 (90 Base) MCG/ACT inhaler Inhale 2 puffs into the lungs every 6 (six) hours as needed for wheezing or shortness of breath. 11/16/17   Lindell Spar I, NP  bisacodyl (DULCOLAX) 10 MG suppository Place 1 suppository (10 mg total) rectally daily as needed for moderate  constipation. 04/29/22   Geradine Girt, DO  docusate sodium (COLACE) 100 MG capsule Take 1 capsule (100 mg total) by mouth 2 (two) times daily. 04/29/22   Geradine Girt, DO  folic acid (FOLVITE) 1 MG tablet Take 1 tablet (1 mg total) by mouth daily. Patient not taking: Reported on 04/05/2022 02/12/22   Elodia Florence., MD  ipratropium-albuterol (DUONEB) 0.5-2.5 (3) MG/3ML SOLN Take 3 mLs by nebulization every 6 (six) hours as needed. Patient not taking: Reported on 04/05/2022 02/12/22   Elodia Florence., MD  methocarbamol (ROBAXIN) 750 MG tablet Take 1 tablet (750 mg total) by mouth every 6 (six) hours as needed for muscle spasms. 04/29/22   Geradine Girt, DO  nicotine (NICODERM CQ - DOSED IN MG/24 HOURS) 21 mg/24hr patch Place 1 patch (21 mg total) onto the skin daily. 04/30/22   Geradine Girt, DO  ondansetron (ZOFRAN) 4 MG tablet Take 1 tablet (4 mg total) by mouth every 6 (six) hours as needed for nausea. 04/29/22   Geradine Girt, DO  oxyCODONE (OXY IR/ROXICODONE) 5 MG immediate release tablet Take 1-2 tablets (5-10 mg total) by mouth every 4 (four) hours as needed for moderate pain or severe pain (5 mg for 4-6/10 pain.  10 mg for >6/10 pain). 04/29/22   Geradine Girt, DO  pantoprazole (PROTONIX) 40 MG tablet Take 1 tablet (40 mg total) by mouth daily. Patient not taking: Reported on 04/17/2022 11/16/21   Thurnell Lose, MD  traMADol (ULTRAM) 50 MG tablet Take 1 tablet (50 mg total) by mouth every 12 (twelve) hours as needed for severe pain (pain not relieved by oxycodone). 04/29/22   Geradine Girt, DO      Allergies    Iron dextran and Aspirin    Review of Systems   Review of Systems  Constitutional:  Negative for fever.  Gastrointestinal:  Positive for abdominal pain. Negative for constipation and vomiting.    Physical Exam Updated Vital Signs BP (!) 140/82 (BP Location: Right Arm)   Pulse (!) 104   Temp 98.7 F (37.1 C) (Oral)   Resp 19   LMP 01/08/2011   SpO2  94%  Physical Exam CONSTITUTIONAL: Ill-appearing HEAD: Normocephalic/atraumatic EYES: EOMI ENMT: Mucous membranes moist NECK: supple no meningeal signs CV: S1/S2 noted, tachycardic LUNGS: Mild tachypnea, scattered wheezing bilaterally ABDOMEN: soft, midline incision from recent laparotomy noted, sutures in place, erythema is noted with diffuse tenderness.  See photo NEURO: Pt is awake/alert/appropriate, moves all extremitiesx4.  No facial droop.   EXTREMITIES: pulses normal/equal, full ROM SKIN: warm, see photo PSYCH: Anxious    ED Results / Procedures / Treatments   Labs (all labs ordered are listed, but only abnormal results are displayed) Labs Reviewed  COMPREHENSIVE METABOLIC PANEL - Abnormal; Notable for the following components:      Result Value   Glucose, Bld 128 (*)    BUN <5 (*)    Creatinine, Ser 0.36 (*)    Calcium 8.7 (*)    Albumin 2.8 (*)    AST 12 (*)    Alkaline Phosphatase 131 (*)    All other components within normal limits  CBC WITH DIFFERENTIAL/PLATELET - Abnormal; Notable for the following components:   WBC 18.9 (*)    Platelets 565 (*)    Neutro Abs 15.6 (*)    Abs Immature Granulocytes 0.09 (*)    All other components within normal limits  CULTURE, BLOOD (ROUTINE X 2)  CULTURE, BLOOD (ROUTINE X 2)  URINE CULTURE  LACTIC ACID, PLASMA  PROTIME-INR  APTT  URINALYSIS, ROUTINE W REFLEX MICROSCOPIC  RAPID URINE DRUG SCREEN, HOSP PERFORMED  ETHANOL  I-STAT BETA HCG BLOOD, ED ([facility], [facility], [facility] ONLY)    EKG EKG Interpretation  Date/Time:  Sunday May 04 2022 05:06:23 EST Ventricular Rate:  108 PR Interval:  118 QRS Duration: 90 QT Interval:  334 QTC Calculation: 448 R Axis:   46 Text Interpretation: Sinus tachycardia LAE, consider biatrial enlargement Confirmed by Ripley Fraise [phone]) on 05/04/2022 5:11:52 AM  Radiology DG Chest Port 1 View  Result Date: 05/04/2022 CLINICAL DATA:  56 year old female with possible sepsis.  Pain. EXAM:  PORTABLE CHEST 1 VIEW COMPARISON:  CTA chest 04/04/2022 and earlier. FINDINGS: Portable AP upright view at 0419 hours. Lung volumes and mediastinal contours are stable and within normal limits. Allowing for portable technique the lungs are clear. No pneumothorax or pleural effusion. Visualized tracheal air column is within normal limits. Negative visible bowel gas and osseous structures; chronic T9 compression fracture better demonstrated by CT. IMPRESSION: Negative portable chest. Electronically Signed   By: Genevie Ann M.D.   On: 05/04/2022 05:24    Procedures Procedures    Medications Ordered in ED Medications  fentaNYL (SUBLIMAZE) injection 50  mcg (50 mcg Intravenous Given 05/04/22 9417)    ED Course/ Medical Decision Making/ A&P Clinical Course as of 05/04/22 0657  Sun May 04, 2022  0455 WBC(!): 18.9 Leukocytosis noted [DW]  0512 Patient with complex history including schizoaffective disorder and previous suicide attempt presents with increasing abdominal pain.  Patient had recent laparotomy for small bowel obstruction, and then required a return to the operating room for wound dehiscence.  She is now having diffuse abdominal pain with erythema noted around the suture line.  Patient will need to undergo abdominal imaging [DW]  [phone] Signed out to Dr Sherry Ruffing at shift change to f/u on CT imaging [DW]    Clinical Course User Index [DW] Ripley Fraise, MD                           Medical Decision Making Amount and/or Complexity of Data Reviewed Labs: ordered. Decision-making details documented in ED Course.   This patient presents to the ED for concern of abdominal pain, this involves an extensive number of treatment options, and is a complaint that carries with it a high risk of complications and morbidity.  The differential diagnosis includes but is not limited to, pancreatitis, gastritis, peptic ulcer disease, , bowel obstruction, bowel perforation, diverticulitis, AAA, ischemic bowel,  postoperative complication, postoperative infection   Comorbidities that complicate the patient evaluation: Patient's presentation is complicated by their history of schizoaffective disorder, COPD, hypertension  Social Determinants of Health: Patient's  history of substance use disorder   increases the complexity of managing their presentation  Additional history obtained: Records reviewed previous admission documents  Lab Tests: I Ordered, and personally interpreted labs.  The pertinent results include: Leukocytosis  Imaging Studies ordered: I ordered imaging studies including CT scan abdomen pelvis and X-ray chest     Cardiac Monitoring: The patient was maintained on a cardiac monitor.  I personally viewed and interpreted the cardiac monitor which showed an underlying rhythm of:  sinus tachycardia  Medicines ordered and prescription drug management: I ordered medication including fentanyl for pain Reevaluation of the patient after these medicines showed that the patient    improved   Reevaluation: After the interventions noted above, I reevaluated the patient and found that they have :improved  Complexity of problems addressed: Patient's presentation is most consistent with  acute presentation with potential threat to life or bodily function  Disposition: After consideration of the diagnostic results and the patient's response to treatment,  I feel that the patent would benefit from admission   .           Final Clinical Impression(s) / ED Diagnoses Final diagnoses:  None    Rx / DC Orders ED Discharge Orders     None         Ripley Fraise, MD 05/04/22 [phone]

## 2022-05-04 NOTE — ED Provider Notes (Signed)
7:15 AM Care assumed from Dr. Christy Gentles.  At time of transfer of care, patient is waiting for results of CT on pelvis to determine disposition.  Patient recently had abdominal surgery and presents for worsening pain, erythema, purulent drainage, and concern for recurrent and worsening abdominal infection.  If CT imaging is concerning, anticipate discussion with general surgery.     According to the chart, patient has a history of gastric bypass, diabetes, COPD, anemia, GERD, hypertension, schizoaffective disorder, and had recent admission several months ago due to suicide attempt with aspiration pneumonia complication requiring intubation and ultimately trach placement.  Patient then had colonic ileus and had small bowel obstruction develop from 1013 through 1021 initially treated conservatively before leaving AMA on 10/21.  Patient then presented back and had to have a laparotomy for small bowel obstruction with lysis of adhesions with Dr. Grandville Silos on 11/2.  Patient was then discharged on 11/7 and told the previous team that pain has worsened for the last few days with redness and drainage.  General surgery saw the patient and feel she is appropriate for a medicine admission and they will see in consultation.  Medicine agrees and will admit.  Clinical Impression: 1. Generalized abdominal pain   2. Cellulitis, unspecified cellulitis site     Disposition: Admit  This note was prepared with assistance of Dragon voice recognition software. Occasional wrong-word or sound-a-like substitutions may have occurred due to the inherent limitations of voice recognition software.       Ceasia Elwell, Gwenyth Allegra, MD 05/04/22 1037

## 2022-05-04 NOTE — ED Notes (Signed)
Pt spO2 level dipped to 87 while she was sleeping, this RN put pt on 2L O2.

## 2022-05-04 NOTE — ED Notes (Signed)
Snacks provided.

## 2022-05-04 NOTE — Progress Notes (Signed)
Pharmacy Antibiotic Note  [name] is a 56 y.o. female admitted on 05/04/2022 with  intra-abdominal infection .  Pharmacy has been consulted for Zosyn dosing.  Plan: Zosyn 3.375g IV q8h (4 hour infusion). Monitor renal function    Temp (24hrs), Avg:98.6 F (37 C), Min:98.5 F (36.9 C), Max:98.7 F (37.1 C)  Recent Labs  Lab 04/29/22 0754 05/04/22 0415  WBC 7.9 18.9*  CREATININE 0.43* 0.36*  LATICACIDVEN  --  0.9    Estimated Creatinine Clearance: 61.1 mL/min (A) (by C-G formula based on SCr of 0.36 mg/dL (L)).    Allergies  Allergen Reactions   Iron Dextran Shortness Of Breath and Anxiety   Aspirin Nausea And Vomiting and Other (See Comments)    Ok to take tylenol or ibuprofen     Antimicrobials this admission: Zosyn 11/12 >>   Thank you for allowing pharmacy to be a part of this patient's care.  Alanda Slim, PharmD, [facility] Clinical Pharmacist Please see AMION for all Pharmacists' Contact Phone Numbers 05/04/2022, 8:42 AM

## 2022-05-04 NOTE — ED Provider Triage Note (Signed)
  Emergency Medicine Provider Triage Evaluation Note  MRN:  [id number]  Arrival date & time: 05/04/22    Medically screening exam initiated at 3:59 AM.   CC:   Abdominal Pain   HPI:  [name] is a 56 y.o. year-old female presents to the ED with chief complaint of abdominal pain.  Hx of SBO and ileus.  Reports severe abdominal pain that started today.  Has had some erythema and yellow discharge from surgical incisions.  History provided by patient. ROS:  -As included in HPI PE:   Vitals:   05/04/22 0344  BP: (!) 148/93  Pulse: (!) 117  Resp: 18  Temp: 98.6 F (37 C)  SpO2: 94%    Non-toxic appearing No respiratory distress Diffuse abdominal tenderness Erythema around surgical site (mild) MDM:  Based on signs and symptoms, SBO is highest on my differential, followed by post op complication. I've ordered labs and imaging in triage to expedite lab/diagnostic workup.  Patient was informed that the remainder of the evaluation will be completed by another provider, this initial triage assessment does not replace that evaluation, and the importance of remaining in the ED until their evaluation is complete.    Montine Circle, PA-C 05/04/22 0401

## 2022-05-04 NOTE — ED Notes (Signed)
Dinner provided.

## 2022-05-04 NOTE — ED Notes (Signed)
Turkey sandwich tray provided. 

## 2022-05-04 NOTE — ED Notes (Signed)
Dr. Tamala Julian messaged about updating diet. Family reports being told she needs to eat and have more protein by GI.

## 2022-05-04 NOTE — Progress Notes (Signed)
Subjective: CC: Recently discharged after surgery for SBO complicated by fascial dehiscence and washout/closure with retentions 11/2. She returns with uncontrolled abdominal pain. Reports poor PO intake at home but denies nausea or vomiting. Her last BM was yesterday after an oral laxative. Denies diarrhea or fever. Denies fevers. States she has been wearing her abdominal binder when up and moving. She continues to smoke cigarettes at home. Has has help from her daughter and [facility].  Objective: Vital signs in last 24 hours: Temp:  [98.5 F (36.9 C)-98.7 F (37.1 C)] 98.5 F (36.9 C) (11/12 0744) Pulse Rate:  [99-117] 107 (11/12 1245) Resp:  [16-27] 20 (11/12 1230) BP: (131-148)/(77-101) 133/77 (11/12 1245) SpO2:  [87 %-100 %] 97 % (11/12 1230)    Intake/Output from previous day: No intake/output data recorded. Intake/Output this shift: No intake/output data recorded.  PE: General: Alert, NAD Lungs: Respiratory effort nonlabored on room air Abd: soft, non-distended, reports generalized pain, bowel sounds present. Midline incision cdi with retention sutures intact, small opening central aspect of incision without drainage; mild redness at retention suture insertion sites - slightly worse over right lower retentions. There is no fluctuance or true cellulitis.   Lab Results:  Recent Labs    05/04/22 0415  WBC 18.9*  HGB 13.4  HCT 40.6  PLT 565*   BMET Recent Labs    05/04/22 0415  NA 139  K 3.9  CL 98  CO2 28  GLUCOSE 128*  BUN <5*  CREATININE 0.36*  CALCIUM 8.7*   PT/INR Recent Labs    05/04/22 0415  LABPROT 12.1  INR 0.9   CMP     Component Value Date/Time   NA 139 05/04/2022 0415   K 3.9 05/04/2022 0415   CL 98 05/04/2022 0415   CO2 28 05/04/2022 0415   GLUCOSE 128 (H) 05/04/2022 0415   BUN <5 (L) 05/04/2022 0415   CREATININE 0.36 (L) 05/04/2022 0415   CREATININE 0.57 03/28/2021 0000   CALCIUM 8.7 (L) 05/04/2022 0415   PROT 7.0 05/04/2022 0415    ALBUMIN 2.8 (L) 05/04/2022 0415   AST 12 (L) 05/04/2022 0415   ALT 26 05/04/2022 0415   ALKPHOS 131 (H) 05/04/2022 0415   BILITOT 0.3 05/04/2022 0415   GFRNONAA >60 05/04/2022 0415   GFRAA >60 08/03/2018 1514   Lipase     Component Value Date/Time   LIPASE 40 04/17/2022 1018    Studies/Results: CT ABDOMEN PELVIS W CONTRAST  Result Date: 05/04/2022 CLINICAL DATA:  56 year old female with pain and possible sepsis. Ileus versus small-bowel obstruction on CT last month, 2 abdominal surgeries since that time, including - 04/18/2022 resection of adhesions and resection of a distal small bowel stricture in the right lower quadrant. - exploratory laparotomy on 04/24/2022 with negative surgical evaluation of the small and large bowel at that time. EXAM: CT ABDOMEN AND PELVIS WITH CONTRAST TECHNIQUE: Multidetector CT imaging of the abdomen and pelvis was performed using the standard protocol following bolus administration of intravenous contrast. RADIATION DOSE REDUCTION: This exam was performed according to the departmental dose-optimization program which includes automated exposure control, adjustment of the mA and/or kV according to patient size and/or use of iterative reconstruction technique. CONTRAST:  15m OMNIPAQUE IOHEXOL 350 MG/ML SOLN COMPARISON:  CT Abdomen and Pelvis 04/17/2022 and earlier. FINDINGS: Lower chest: Negative lung bases. No cardiomegaly or pericardial effusion. Hepatobiliary: Negative liver and gallbladder. Pancreas: Chronic main pancreatic ductal enlargement, new since 2019. No pancreatic atrophy or inflammation identified.  Spleen: Diminutive, negative. Adrenals/Urinary Tract: Normal adrenal glands. Symmetric renal enhancement and contrast excretion. No nephrolithiasis or convincing pararenal inflammation. Unremarkable bladder. Occasional chronic pelvic phleboliths are stable since 2019. Stomach/Bowel: Chronic gastric bypass, gastrojejunostomy Roux-en-Y type changes. No free air  or free fluid identified. No fluid collection identified in the abdomen or pelvis. No dilated large or small bowel loops today. However, there is generalized bowel wall thickening involving multiple both proximal and distal small bowel loops (including the duodenum on series 3, image 35) and also the large bowel from the distal descending colon through the rectosigmoid (series 3, image 63). Upstream retained low-density stool and gas in the proximal colon. New distal small bowel anastomosis in the right lower quadrant since the CT last month consistent with surgical history. No evidence of obstruction. Vascular/Lymphatic: Aortoiliac calcified atherosclerosis. Major arterial structures in the abdomen and pelvis remain patent. Portal venous system appears patent. No lymphadenopathy identified. Reproductive: Other: No pelvic free fluid. Musculoskeletal: Partially visible chronic T9 compression fracture. Stable visualized osseous structures. Recent postoperative changes to the ventral abdominal wall with no adverse features. IMPRESSION: 1. Interval laparotomy and resected distal small bowel stricture with primary reanastomosis. Underlying chronic Roux-en-Y type gastric bypass. 2. Widespread thickened but nondilated small bowel loops today, with similar generalized large bowel thickening from the distal descending colon to the rectum. This is nonspecific, but consider infectious or inflammatory enterocolitis. No evidence of bowel obstruction. No postoperative abscess. 3. Otherwise stable CT appearance of the abdomen and pelvis. Electronically Signed   By: Genevie Ann M.D.   On: 05/04/2022 07:30   DG Chest Port 1 View  Result Date: 05/04/2022 CLINICAL DATA:  56 year old female with possible sepsis.  Pain. EXAM: PORTABLE CHEST 1 VIEW COMPARISON:  CTA chest 04/04/2022 and earlier. FINDINGS: Portable AP upright view at 0419 hours. Lung volumes and mediastinal contours are stable and within normal limits. Allowing for  portable technique the lungs are clear. No pneumothorax or pleural effusion. Visualized tracheal air column is within normal limits. Negative visible bowel gas and osseous structures; chronic T9 compression fracture better demonstrated by CT. IMPRESSION: Negative portable chest. Electronically Signed   By: Genevie Ann M.D.   On: 05/04/2022 05:24    Anti-infectives: Anti-infectives (From admission, onward)    Start     Dose/Rate Route Frequency Ordered Stop   05/04/22 0845  piperacillin-tazobactam (ZOSYN) IVPB 3.375 g        3.375 g 12.5 mL/hr over 240 Minutes Intravenous Every 8 hours 05/04/22 0840          Assessment/Plan SBO H/o gastric bypass 2017 Dr. Excell Seltzer, history of laparoscopic hysterectomy - CT 10/13 w/ SBO and TP poster pelvis. Mild twisting of mesentery within RLQ. Small HH -Admitted to hospitalist service 10/13 through 10/20 for SBO managed conservatively.  NG tube removed during admission but obstruction did not completely resolve.  She left AMA 10/20 after recommendation for continued n.p.o. status and NG tube replacement -CT 10/24 and 10/26 done without oral contrast showing diffuse small bowel distention concerning for ileus versus SBO however favor pSBO given history   POD#16 s/p exploratory laparotomy/ small bowel resection/ lysis of adhesions 04/18/22 - Dr. Brantley Stage -Intraop findings of Small bowel stricture POD#10 s/p ex lap abdominal closure w/ retention sutures for wound dehiscence 04/24/22 Dr. Grandville Silos  -Surgical path without IBD or neoplasm. Fibrous adhesion and benign mesothelial cell proliferation -CT abdomen pelvis 11/12 w/ post-operative changes, SB anastomosis appears patent and no obstruction. There is intraabdominal fluid collection and no  abd wall fluid collection. There is generalized wall thickening of the bowel that was present on previous scans as well.  - right now I do not see a convincing source of her leukocytosis, CXR negative for PNA. Her sxs are not  consistent with enteritis or colitis, and there is no intr-abdominal abscess. Will check UA.  - she has been started on Zosyn for presumed intra-abdominal source of infection which we can continue for now but I would have a low threshold to continue if she continues to have a negative workup.    FEN: Soft, daily miralax and BID colace. IVF per primary; encourage snacks high in protein. ID: [email or web address] 11/2. None currently.  VTE: lovenox  pain: schedule tylenol, continue PRN oxy, add robaxin for pain.  Alcohol use  Tobacco use DM GERD COPD Afib  HTN   LOS: 0 days    Jill Alexanders , [facility] Surgery 05/04/2022, 12:48 PM Please see Amion for pager number during day hours 7:00am-4:30pm

## 2022-05-04 NOTE — H&P (Signed)
History and Physical    Patient: [name] VOJ:[id number] DOB: 06/03/1966 DOA: 05/04/2022 DOS: the patient was seen and examined on 05/04/2022 PCP: [name], MD  Patient coming from: via EMS  Chief Complaint:  Chief Complaint  Patient presents with   Abdominal Pain   HPI: [name] is a 56 y.o. female with medical history significant of hypertension, DM type II, history of Roux-en-Y gastric bypass bypass in 2017, COPD, schizoaffective disorder, anemia, polysubstance abuse, suicide attempt in 02/9370 complicated by aspiration pneumonia with respiratory failure requiring temporary tracheostomy placement, history of seizure 01/2022, small bowel obstruction ultimately requiring laparotomy with lysis of adhesions 04/18/2022 with Dr. Brantley Stage, and subsequent explored for laparotomy with abdominal closure with retention sutures with Dr. Grandville Silos on 11/2 who presents with complaints of abdominal pain.  Patient had been discharged home on 11/7 with her youngest daughter helping to care for her.  Since getting discharge patient had been complaining of abdominal pain.  Her oldest daughter who is also her healthcare power of attorney notes that after her suicide attempt she is very impulsive and has poor short-term and long-term memory for which there was concern that she may have suffered an anoxic brain injury.  They had initially tried placing her pain medications and bottles but noted that they were concerned that she may try and take more than she should for which they have been dispensing them.  Reports that she has been taking tramadol every 12 hours and 1- 2 pills of oxycodone 5 mg every 4-6 hours as needed.  Patient stated that pain was still 8/10 pain scale despite doing this.  Home health care had come out and changed her dressings 5 days ago.  Her daughter had change the dressings as advised yesterday and at that time noted some mild redness with some material in the tubes where the  sutures were.  Family noticed that the patient had not had a bowel movement since discharge and wondered if that was causing some her pain as she had not been on any stool softeners.  Patient was given 3 laxatives and had a large bowel movement yesterday afternoon, but pain persisted.  Her daughter also notes that the patient had resumed drinking alcohol after being discharged but they had gotten her to quit 2 -3 days ago.  Patient reports being tremulous.  Denies having any fevers, nausea, or vomiting.  In the emergency department patient was noted to be afebrile with heart rates elevated to 117, respirations 16-23, blood pressures maintained, and O2 saturations as low as 87% on room air while sleeping with improvement on 2 L of nasal cannula oxygen.  Labs significant for WBC 18.9, platelet count 565, alkaline phosphatase 131,  albumin 2.8, and lactic acid 0.9.  CT scan of the abdomen and pelvis noted widespread thickening but nondilated small loops of bowel today with similar generalized large bowel thickening from the distal descending colon to the rectum concerning for enterocolitis without signs of obstruction.  Urinalysis noted no signs of infection.  UDS negative.  Blood cultures were obtained.  Patient was started on empiric antibiotics of Zosyn and given fentanyl for pain.  General surgery was consulted and will see the patient.  Review of Systems: As mentioned in the history of present illness. All other systems reviewed and are negative. Past Medical History:  Diagnosis Date   Abdominal pain    Accidental drug overdose April 2013   Anxiety    Atrial fibrillation ([place]) 09/29/11  converted spontaneously   Chronic back pain    Chronic knee pain    Chronic nausea    Chronic pain    COPD (chronic obstructive pulmonary disease) ([place])    Depression    Diabetes mellitus    states her doctor took her off all DM meds in past month   Diabetic neuropathy ([place])    Dyspnea    with exertion    GERD  (gastroesophageal reflux disease)    Headache(784.0)    migraines    HTN (hypertension)    not on meds since in a year    Hyperlipidemia    Hypothyroidism    not on meds in a while    Mental disorder    Bipolar and schizophrenic   Requires supplemental oxygen    as needed per patient    Schizophrenia ([place])    Schizophrenia, acute ([place]) 11/13/2017   Tobacco abuse    Past Surgical History:  Procedure Laterality Date   ABDOMINAL HYSTERECTOMY     BLADDER SUSPENSION  03/04/2011   Procedure: [facility] PROCEDURE;  Surgeon: Elayne Snare MacDiarmid;  Location: [facility];  Service: Urology;  Laterality: N/A;   BOWEL RESECTION N/A 04/18/2022   Procedure: SMALL BOWEL RESECTION;  Surgeon: Erroll Luna, MD;  Location: [facility];  Service: General;  Laterality: N/A;   CYSTOCELE REPAIR  03/04/2011   Procedure: ANTERIOR REPAIR (CYSTOCELE);  Surgeon: Reece Packer;  Location: [facility];  Service: Urology;  Laterality: N/A;   CYSTOSCOPY  03/04/2011   Procedure: CYSTOSCOPY;  Surgeon: Elayne Snare MacDiarmid;  Location: [facility];  Service: Urology;  Laterality: N/A;   ESOPHAGOGASTRODUODENOSCOPY (EGD) WITH PROPOFOL N/A 05/12/2017   Procedure: ESOPHAGOGASTRODUODENOSCOPY (EGD) WITH PROPOFOL;  Surgeon: Alphonsa Overall, MD;  Location: [facility];  Service: General;  Laterality: N/A;   GASTRIC ROUX-EN-Y N/A 03/25/2016   Procedure: LAPAROSCOPIC ROUX-EN-Y GASTRIC BYPASS WITH UPPER ENDOSCOPY;  Surgeon: Excell Seltzer, MD;  Location: [facility];  Service: General;  Laterality: N/A;   KNEE SURGERY     LAPAROSCOPIC ASSISTED VAGINAL HYSTERECTOMY  03/04/2011   Procedure: LAPAROSCOPIC ASSISTED VAGINAL HYSTERECTOMY;  Surgeon: Cyril Mourning, MD;  Location: [facility];  Service: Gynecology;  Laterality: N/A;   LAPAROTOMY N/A 04/18/2022   Procedure: EXPLORATORY LAPAROTOMY;  Surgeon: Erroll Luna, MD;  Location: [facility];  Service: General;  Laterality: N/A;   LAPAROTOMY N/A 04/24/2022   Procedure: BRING BACK EXPLORATORY LAPAROTOMY;  Surgeon:  Georganna Skeans, MD;  Location: [facility];  Service: General;  Laterality: N/A;   Social History:  reports that she has been smoking cigarettes. She has been smoking an average of .5 packs per day. She has never used smokeless tobacco. She reports current alcohol use. She reports that she does not currently use drugs after having used the following drugs: Cocaine, Marijuana, and "Crack" cocaine.  Allergies  Allergen Reactions   Iron Dextran Shortness Of Breath and Anxiety   Aspirin Nausea And Vomiting and Other (See Comments)    Ok to take tylenol or ibuprofen     Family History  Problem Relation Age of Onset   Heart attack Father        26s   Diabetes Mother    Heart disease Mother    Hypertension Mother    Heart attack Sister        3   COPD Other    Breast cancer Neg Hx     Prior to Admission medications   Medication Sig Start Date End Date Taking? Authorizing Provider  acetaminophen (TYLENOL) 500 MG tablet Take 2 tablets (1,000 mg total) by mouth every 6 (six) hours as needed for mild pain. 04/29/22   Geradine Girt, DO  albuterol (PROAIR HFA) 108 (90 Base) MCG/ACT inhaler Inhale 2 puffs into the lungs every 6 (six) hours as needed for wheezing or shortness of breath. 11/16/17   Lindell Spar I, NP  bisacodyl (DULCOLAX) 10 MG suppository Place 1 suppository (10 mg total) rectally daily as needed for moderate constipation. 04/29/22   Geradine Girt, DO  docusate sodium (COLACE) 100 MG capsule Take 1 capsule (100 mg total) by mouth 2 (two) times daily. 04/29/22   Geradine Girt, DO  folic acid (FOLVITE) 1 MG tablet Take 1 tablet (1 mg total) by mouth daily. Patient not taking: Reported on 04/05/2022 02/12/22   Elodia Florence., MD  ipratropium-albuterol (DUONEB) 0.5-2.5 (3) MG/3ML SOLN Take 3 mLs by nebulization every 6 (six) hours as needed. Patient not taking: Reported on 04/05/2022 02/12/22   Elodia Florence., MD  methocarbamol (ROBAXIN) 750 MG tablet Take 1 tablet (750  mg total) by mouth every 6 (six) hours as needed for muscle spasms. 04/29/22   Geradine Girt, DO  nicotine (NICODERM CQ - DOSED IN MG/24 HOURS) 21 mg/24hr patch Place 1 patch (21 mg total) onto the skin daily. 04/30/22   Geradine Girt, DO  ondansetron (ZOFRAN) 4 MG tablet Take 1 tablet (4 mg total) by mouth every 6 (six) hours as needed for nausea. 04/29/22   Geradine Girt, DO  oxyCODONE (OXY IR/ROXICODONE) 5 MG immediate release tablet Take 1-2 tablets (5-10 mg total) by mouth every 4 (four) hours as needed for moderate pain or severe pain (5 mg for 4-6/10 pain. 10 mg for >6/10 pain). 04/29/22   Geradine Girt, DO  pantoprazole (PROTONIX) 40 MG tablet Take 1 tablet (40 mg total) by mouth daily. Patient not taking: Reported on 04/17/2022 11/16/21   Thurnell Lose, MD  traMADol (ULTRAM) 50 MG tablet Take 1 tablet (50 mg total) by mouth every 12 (twelve) hours as needed for severe pain (pain not relieved by oxycodone). 04/29/22   Geradine Girt, DO    Physical Exam: Vitals:   05/04/22 0606 05/04/22 0630 05/04/22 0730 05/04/22 0744  BP: (!) 140/82 136/88  131/81  Pulse: (!) 104 (!) 102 (!) 101 (!) 101  Resp: 19 16 (!) 23 (!) 23  Temp: 98.7 F (37.1 C)   98.5 F (36.9 C)  TempSrc: Oral   Oral  SpO2: 94% (!) 87% 96% 96%   Exam  Constitutional: Middle-aged female who appears slightly older than stated age Eyes: PERRL, lids and conjunctivae normal ENMT: Mucous membranes are dry. Neck: normal, supple,   Respiratory: Tachypneic with mild intermittent wheezes appreciated in both lung fields.  O2 saturations currently maintained on 2 L of nasal cannula oxygen. cardiovascular: Tachycardic, no murmurs / rubs / gallops. No extremity edema. 2+ pedal pulses. No carotid bruits.  Abdomen: Postoperative sutures in place with erythema surrounding and purulent appearing drainage in tubing.  Musculoskeletal: no clubbing / cyanosis. No joint deformity upper and lower extremities. Good ROM, no  contractures. Normal muscle tone.  Skin: no rashes, lesions, ulcers. No induration Neurologic: CN 2-12 grossly intact. Sensation intact, DTR normal. Strength 5/5 in all 4.  Psychiatric: Normal judgment and insight. Alert and oriented x 3. Normal mood.   Data Reviewed:  Reviewed labs, imaging, pertinent records as noted above in HPI. Assessment and Plan:  Sepsis secondary to postop wound infection with cellulitis of abdomen enterocolitis Patient presented with tachycardia, tachypnea, and elevated white blood cell count of 18.9.  Found to have erythema with increased warmth and purulent appearing drainage surrounding postoperative wound from recent small bowel obstruction prior resection.  CT scan of the abdomen and pelvis noted no signs of focal abscess, but did note concern for possible inflammatory infectious enterocolitis.  Lactic acid was reassuring at 0.9.  Patient has been started on empiric antibiotics of Zosyn. -Admit to a telemetry bed -Follow-up blood cultures -Continue Zosyn -Oxycodone/morphine IV as needed for moderate to severe pain respectively -Continue Colace twice daily -Appreciate general surgery consultative services, will follow-up for further recommendations  Hypoxia COPD, without acute exacerbation Patient was noted to have drop in O2 saturation down to 87% on room air while resting.  Patient noted to have decreased overall aeration with some mild intermittent wheezes present.  Suspect likely secondary to poor expiratory effort  at least in part related to abdominal pain -Continuous pulse oximetry with oxygen as needed -Incentive spirometry -DuoNebs as needed  Thrombocytosis Acute.  Platelet count elevated at 565.  Thought to be reactive in nature to above. -Continue to monitor  Controlled diabetes mellitus type 2 Hemoglobin A1c was 6.5 on 10/14.  Patient not on medications for treatment -Carb modified diet  Memory loss [name] reports that she  has had decrease in short-term and long-term memory since after having the suicide attempt back in July for which there was concern possibly for the patient suffering anoxic brain injury.  Tobacco abuse Patient still smokes cigarettes. -Nicotine patch offered  Polysubstance abuse Patient denies any recent use of cocaine, but had reported to drinking alcohol after initially getting discharged home.  Last drink reported 2 to 3 days ago.  Patient noted to be tremulous on physical exam..  UDS was negative. -CIWA protocols with Ativan as needed  GERD -Continue PPI  Advance Care Planning:   Code Status: Full Code    Consults: General surgery  Family Communication: Patient's oldest daughter updated at bedside and is patient's POA  Severity of Illness: The appropriate patient status for this patient is INPATIENT. Inpatient status is judged to be reasonable and necessary in order to provide the required intensity of service to ensure the patient's safety. The patient's presenting symptoms, physical exam findings, and initial radiographic and laboratory data in the context of their chronic comorbidities is felt to place them at high risk for further clinical deterioration. Furthermore, it is not anticipated that the patient will be medically stable for discharge from the hospital within 2 midnights of admission.   * I certify that at the point of admission it is my clinical judgment that the patient will require inpatient hospital care spanning beyond 2 midnights from the point of admission due to high intensity of service, high risk for further deterioration and high frequency of surveillance required.*  Author: Norval Morton, MD 05/04/2022 8:40 AM  For on call review [email or web address].

## 2022-05-04 NOTE — ED Notes (Signed)
IV team at bedside 

## 2022-05-05 ENCOUNTER — Other Ambulatory Visit: Payer: Self-pay

## 2022-05-05 ENCOUNTER — Encounter (HOSPITAL_COMMUNITY): Payer: Self-pay | Admitting: Internal Medicine

## 2022-05-05 DIAGNOSIS — T8149XA Infection following a procedure, other surgical site, initial encounter: Secondary | ICD-10-CM | POA: Diagnosis not present

## 2022-05-05 LAB — COMPREHENSIVE METABOLIC PANEL
ALT: 18 U/L (ref 0–44)
AST: 8 U/L — ABNORMAL LOW (ref 15–41)
Albumin: 2.4 g/dL — ABNORMAL LOW (ref 3.5–5.0)
Alkaline Phosphatase: 109 U/L (ref 38–126)
Anion gap: 7 (ref 5–15)
BUN: 8 mg/dL (ref 6–20)
CO2: 31 mmol/L (ref 22–32)
Calcium: 8.8 mg/dL — ABNORMAL LOW (ref 8.9–10.3)
Chloride: 101 mmol/L (ref 98–111)
Creatinine, Ser: 0.57 mg/dL (ref 0.44–1.00)
GFR, Estimated: >60 mL/min (ref >60)
Glucose, Bld: 126 mg/dL — ABNORMAL HIGH (ref 70–99)
Potassium: 4.3 mmol/L (ref 3.5–5.1)
Sodium: 139 mmol/L (ref 135–145)
Total Bilirubin: 0.1 mg/dL — ABNORMAL LOW (ref 0.3–1.2)
Total Protein: 6.5 g/dL (ref 6.5–8.1)

## 2022-05-05 LAB — CBC
HCT: 39.1 % (ref 36.0–46.0)
Hemoglobin: 12.5 g/dL (ref 12.0–15.0)
MCH: 28.7 pg (ref 26.0–34.0)
MCHC: 32 g/dL (ref 30.0–36.0)
MCV: 89.7 fL (ref 80.0–100.0)
Platelets: 488 10*3/uL — ABNORMAL HIGH (ref 150–400)
RBC: 4.36 MIL/uL (ref 3.87–5.11)
RDW: 14.1 % (ref 11.5–15.5)
WBC: 13.8 10*3/uL — ABNORMAL HIGH (ref 4.0–10.5)
nRBC: 0 % (ref 0.0–0.2)

## 2022-05-05 LAB — URINE CULTURE

## 2022-05-05 MED ORDER — AMOXICILLIN-POT CLAVULANATE 875-125 MG PO TABS
1.0000 | ORAL_TABLET | Freq: Two times a day (BID) | ORAL | Status: DC
  Administered 2022-05-05 – 2022-05-06 (×2): 1 via ORAL
  Filled 2022-05-05 (×2): qty 1

## 2022-05-05 NOTE — Progress Notes (Signed)
PROGRESS NOTE   [name]  HQI:[id number] DOB: 05/05/1966 DOA: 05/04/2022 PCP: [name], MD  Brief Narrative:  56 year old black female Prior prolapse 2012 status post repair Plan accidental OD, prior cocaine with admissions in the past for this as well as seizure episodes, schizophrenia bipolar chronic pain hospitalization for OD of temazepam 2019 Status post lap gastric bypass 03/25/2016 Dr. Excell Seltzer COPD Lengthy hospitalization 7/23 through 8/23 intentional overdose admitted to [facility] hospitalization complicated by ileus-readmitted 10/13-10/21 for SBO left AMA return to hospital 10/26 through 11/7 and underwent lysis of adhesions small bowel resection 04/18/2022 and had retention sutures placed on 11/12   Return to ED with tachycardia tachypnea White count 18 and erythema warmth purulent drainage around wound from recent small bowel obstruction CT scan?  Inflammatory infectious enterocolitis patient started on Zosyn General surgery consulted but given lack of any drainable abscess etc. feel can manage conservatively  Hospital-Problem based course  Cellulitis erythema surrounding recent SBO surgery Prior ileus, prior laparoscopic gastric bypass 2017 -No current indication for operative management per [facility], white count is improved - Continuing Zosyn for now-if general surgery feels differently they will decide on removal of retention sutures which seem to have a little bit of purulence around them (see picture below)--can probably de-escalate antibiotics to orals in the next 24 to 48 hours - Placed on oxycodone for moderate pain and morphine for severe pain, can use Robaxin 500 4 times daily  Concern for EtOH withdrawal?   Schizophrenia bipolar and drug abuse - Continue CIWA protocol, seems to have some elevated counts in the 2-14 range so would keep for now  Sinus tachycardia - Secondary to anxiety +/- infection-monitor trends may add beta-blocker if continues  COPD  continued smoker -Continue NicoDerm patch daily  DVT prophylaxis: Lovenox Code Status: Full Family Communication: Discussed with daughter on telephone 11/13 Disposition:  Status is: Inpatient Remains inpatient appropriate because:   Requires clearance from surgery and also lower CIWA scores   Consultants:  General surgery  Procedures:   Antimicrobials: Zosyn   Subjective: Awake coherent quite anxious does not seem to recall much of the discussion and asked me to talk to her daughter Pain seems moderately controlled no fever she is ambulatory without distress  Objective: Vitals:   05/04/22 1800 05/04/22 1844 05/05/22 0100 05/05/22 0457  BP: 129/76 124/76 (!) 136/59 (!) 148/91  Pulse: (!) 107 (!) 110 (!) 105 (!) 120  Resp: (!) '24 18 18 20  '$ Temp:  97.8 F (36.6 C) 98.4 F (36.9 C) (!) 97.4 F (36.3 C)  TempSrc:  Oral Oral Oral  SpO2: 100% 96% 97% 95%  Weight:  43.5 kg    Height:  '5\' 2"'$  (1.575 m)      Intake/Output Summary (Last 24 hours) at 05/05/2022 0741 Last data filed at 05/05/2022 0600 Gross per 24 hour  Intake 881.18 ml  Output 0 ml  Net 881.18 ml   Filed Weights   05/04/22 1844  Weight: 43.5 kg    Examination:  Frail black female no distress looks older than stated age No icterus no pallor CTA B no added sound rales rhonchi Abdominal exam as below   Data Reviewed: personally reviewed   CBC    Component Value Date/Time   WBC 13.8 (H) 05/05/2022 0314   RBC 4.36 05/05/2022 0314   HGB 12.5 05/05/2022 0314   HCT 39.1 05/05/2022 0314   PLT 488 (H) 05/05/2022 0314   MCV 89.7 05/05/2022 0314   MCH 28.7 05/05/2022 0314  MCHC 32.0 05/05/2022 0314   RDW 14.1 05/05/2022 0314   LYMPHSABS 1.9 05/04/2022 0415   MONOABS 0.8 05/04/2022 0415   EOSABS 0.5 05/04/2022 0415   BASOSABS 0.1 05/04/2022 0415      Latest Ref Rng & Units 05/05/2022    3:14 AM 05/04/2022    4:15 AM 04/29/2022    7:54 AM  CMP  Glucose 70 - 99 mg/dL 126  128  193   BUN 6 -  20 mg/dL 8  <5  <5   Creatinine 0.44 - 1.00 mg/dL 0.57  0.36  0.43   Sodium 135 - 145 mmol/L 139  139  134   Potassium 3.5 - 5.1 mmol/L 4.3  3.9  3.7   Chloride 98 - 111 mmol/L 101  98  95   CO2 22 - 32 mmol/L '31  28  26   '$ Calcium 8.9 - 10.3 mg/dL 8.8  8.7  8.7   Total Protein 6.5 - 8.1 g/dL 6.5  7.0    Total Bilirubin 0.3 - 1.2 mg/dL 0.1  0.3    Alkaline Phos 38 - 126 U/L 109  131    AST 15 - 41 U/L 8  12    ALT 0 - 44 U/L 18  26       Radiology Studies: CT ABDOMEN PELVIS W CONTRAST  Result Date: 05/04/2022 CLINICAL DATA:  57 year old female with pain and possible sepsis. Ileus versus small-bowel obstruction on CT last month, 2 abdominal surgeries since that time, including - 04/18/2022 resection of adhesions and resection of a distal small bowel stricture in the right lower quadrant. - exploratory laparotomy on 04/24/2022 with negative surgical evaluation of the small and large bowel at that time. EXAM: CT ABDOMEN AND PELVIS WITH CONTRAST TECHNIQUE: Multidetector CT imaging of the abdomen and pelvis was performed using the standard protocol following bolus administration of intravenous contrast. RADIATION DOSE REDUCTION: This exam was performed according to the departmental dose-optimization program which includes automated exposure control, adjustment of the mA and/or kV according to patient size and/or use of iterative reconstruction technique. CONTRAST:  64m OMNIPAQUE IOHEXOL 350 MG/ML SOLN COMPARISON:  CT Abdomen and Pelvis 04/17/2022 and earlier. FINDINGS: Lower chest: Negative lung bases. No cardiomegaly or pericardial effusion. Hepatobiliary: Negative liver and gallbladder. Pancreas: Chronic main pancreatic ductal enlargement, new since 2019. No pancreatic atrophy or inflammation identified. Spleen: Diminutive, negative. Adrenals/Urinary Tract: Normal adrenal glands. Symmetric renal enhancement and contrast excretion. No nephrolithiasis or convincing pararenal inflammation. Unremarkable  bladder. Occasional chronic pelvic phleboliths are stable since 2019. Stomach/Bowel: Chronic gastric bypass, gastrojejunostomy Roux-en-Y type changes. No free air or free fluid identified. No fluid collection identified in the abdomen or pelvis. No dilated large or small bowel loops today. However, there is generalized bowel wall thickening involving multiple both proximal and distal small bowel loops (including the duodenum on series 3, image 35) and also the large bowel from the distal descending colon through the rectosigmoid (series 3, image 63). Upstream retained low-density stool and gas in the proximal colon. New distal small bowel anastomosis in the right lower quadrant since the CT last month consistent with surgical history. No evidence of obstruction. Vascular/Lymphatic: Aortoiliac calcified atherosclerosis. Major arterial structures in the abdomen and pelvis remain patent. Portal venous system appears patent. No lymphadenopathy identified. Reproductive: Other: No pelvic free fluid. Musculoskeletal: Partially visible chronic T9 compression fracture. Stable visualized osseous structures. Recent postoperative changes to the ventral abdominal wall with no adverse features. IMPRESSION: 1. Interval laparotomy and resected distal  small bowel stricture with primary reanastomosis. Underlying chronic Roux-en-Y type gastric bypass. 2. Widespread thickened but nondilated small bowel loops today, with similar generalized large bowel thickening from the distal descending colon to the rectum. This is nonspecific, but consider infectious or inflammatory enterocolitis. No evidence of bowel obstruction. No postoperative abscess. 3. Otherwise stable CT appearance of the abdomen and pelvis. Electronically Signed   By: Genevie Ann M.D.   On: 05/04/2022 07:30   DG Chest Port 1 View  Result Date: 05/04/2022 CLINICAL DATA:  56 year old female with possible sepsis.  Pain. EXAM: PORTABLE CHEST 1 VIEW COMPARISON:  CTA chest  04/04/2022 and earlier. FINDINGS: Portable AP upright view at 0419 hours. Lung volumes and mediastinal contours are stable and within normal limits. Allowing for portable technique the lungs are clear. No pneumothorax or pleural effusion. Visualized tracheal air column is within normal limits. Negative visible bowel gas and osseous structures; chronic T9 compression fracture better demonstrated by CT. IMPRESSION: Negative portable chest. Electronically Signed   By: Genevie Ann M.D.   On: 05/04/2022 05:24     Scheduled Meds:  acetaminophen  1,000 mg Oral TID   docusate sodium  100 mg Oral BID   enoxaparin (LOVENOX) injection  40 mg Subcutaneous W11B   folic acid  1 mg Oral Daily   LORazepam  0-4 mg Intravenous Q6H   Followed by   Derrill Memo ON 05/06/2022] LORazepam  0-4 mg Intravenous Q12H   methocarbamol  500 mg Oral QID   nicotine  21 mg Transdermal Daily   polyethylene glycol  17 g Oral Daily   sodium chloride flush  3 mL Intravenous Q12H   thiamine  100 mg Oral Daily   Or   thiamine  100 mg Intravenous Daily   Continuous Infusions:  piperacillin-tazobactam (ZOSYN)  IV 3.375 g (05/05/22 0452)     LOS: 1 day   Time spent: [place], MD [facility] To contact the attending provider between 7A-7P or the covering provider during after hours 7P-7A, please log into the web site [email or web address] and access using universal [place] password for that web site. If you do not have the password, please call the hospital operator.  05/05/2022, 7:41 AM

## 2022-05-05 NOTE — Plan of Care (Signed)
  Problem: Education: Goal: Knowledge of General Education information will improve Description Including pain rating scale, medication(s)/side effects and non-pharmacologic comfort measures Outcome: Progressing   

## 2022-05-05 NOTE — TOC Initial Note (Signed)
Transition of Care [facility]) - Initial/Assessment Note    Patient Details  Name: [name] MRN: [id number] Date of Birth: 04-May-1966  Transition of Care [facility]) CM/SW Contact:    Tom-Johnson, Renea Ee, RN Phone Number: 05/05/2022, 2:33 PM  Clinical Narrative:                  CM spoke with patient at bedside about needs for post hospital transition. Had recent Ex Lap Small Bowel Resection/ Lysis of Adhesions on 04/18/22 and returned for Ex Lap Abdominal Closure w/ retention sutures for wound dehiscence on 04/24/22. Now admitted for Post-Op wound Infection, on IV abx, Surgery following.  Patient is from home alone, states her brother rents a room at her home. Has three daughters, mother and siblings whom are supportive of her care. Patient was sent home last admission with home health RN from [place] notified of patient's admission to resume care at discharge. Patient has a cane, walker, bsc and Home O2 from [place]. Has PCS aide  5 days/week for 2-3 hrs/day from [facility].  PCP is [name], MD and uses [facility] on [address]. No TOC needs or recommendations has been noted at this time. CM will continue to follow as patient progresses towards discharge.      Expected Discharge Plan: [place] (Resumption of care) Barriers to Discharge: Continued Medical Work up   Patient Goals and CMS Choice Patient states their goals for this hospitalization and ongoing recovery are:: To return home CMS Medicare.gov Compare Post Acute Care list provided to:: Patient    Expected Discharge Plan and Services Expected Discharge Plan: [place] (Resumption of care)   Discharge Planning Services: CM Consult Post Acute Care Choice: [place] arrangements for the past 2 months: Single Family Home                 DME Arranged: N/A DME Agency: NA       HH Arranged: RN [place] Agency: [place]  (Resumption of care) Date [place]: 05/05/22 Time [place]: 34 Representative spoke with at [place]: [place] Arrangements/Services Living arrangements for the past 2 months: [place] with:: Siblings (Brother rents a room at her house.) Patient language and need for interpreter reviewed:: Yes Do you feel safe going back to the place where you live?: Yes      Need for Family Participation in Patient Care: Yes (Comment) Care giver support system in place?: Yes (comment)   Criminal Activity/Legal Involvement Pertinent to Current Situation/Hospitalization: No - Comment as needed  Activities of Daily Living Home Assistive Devices/Equipment: None ADL Screening (condition at time of admission) Patient's cognitive ability adequate to safely complete daily activities?: Yes Is the patient deaf or have difficulty hearing?: No Does the patient have difficulty seeing, even when wearing glasses/contacts?: No Does the patient have difficulty concentrating, remembering, or making decisions?: No Patient able to express need for assistance with ADLs?: Yes Does the patient have difficulty dressing or bathing?: No Independently performs ADLs?: Yes (appropriate for developmental age) Does the patient have difficulty walking or climbing stairs?: No Weakness of Legs: None Weakness of Arms/Hands: None  Permission Sought/Granted Permission sought to share information with : Case Manager, Family Supports, Customer service manager Permission granted to share information with : Yes, Verbal Permission Granted              Emotional Assessment  Appearance:: Appears stated age Attitude/Demeanor/Rapport: Engaged, Gracious Affect (typically observed): Accepting, Appropriate, Calm, Hopeful, Pleasant Orientation: : Oriented to Self, Oriented to Place, Oriented to  Time, Oriented to Situation Alcohol / Substance Use: Not Applicable Psych Involvement:  No (comment)  Admission diagnosis:  Postoperative wound infection [T81.49XA] Generalized abdominal pain [R10.84] Cellulitis, unspecified cellulitis site [L03.90] Patient Active Problem List   Diagnosis Date Noted   Postoperative wound infection 05/04/2022   Sepsis ([place]) 05/04/2022   Enterocolitis 05/04/2022   Thrombocytosis 05/04/2022   Hypoxia 05/04/2022   Seizure ([place]) 04/17/2022   Memory loss 04/17/2022   Hypomagnesemia 04/08/2022   Small bowel obstruction ([place]) 04/07/2022   SOB (shortness of breath) 04/05/2022   Abnormal EEG 02/12/2022   Constipation 02/12/2022   Acute urinary retention 02/12/2022   Vitamin A deficiency 02/12/2022   Vitamin D deficiency 02/12/2022   Vitamin C deficiency 02/12/2022   Iron deficiency anemia 02/12/2022   GERD (gastroesophageal reflux disease) 02/12/2022   Tobacco abuse 02/12/2022   Ileus ([place])    Protein-calorie malnutrition, severe 01/14/2022   Acute respiratory failure with hypoxia ([place]) 11/14/2021   Anemia 11/14/2021   Polysubstance (excluding opioids) dependence, daily use ([place]) 08/04/2018   Substance induced mood disorder ([place]) 08/03/2018   Schizoaffective disorder, bipolar type ([place]) 11/13/2017   Polysubstance abuse ([place]) 11/13/2017   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   Type 2 diabetes mellitus ([place]) 09/06/2015   Essential hypertension 11/04/2011   Hypokalemia 10/02/2011   COPD (chronic obstructive pulmonary disease) ([place]) 09/29/2011   PCP:  [name], MD Pharmacy:   [facility], [address] [place] [address] Phone: [phone] Fax: [phone]     Social Determinants of Health (SDOH) Interventions    Readmission Risk Interventions    04/28/2022    2:18 PM 04/07/2022   12:41 PM 07/27/2020   11:06 AM  Readmission Risk Prevention Plan  Transportation Screening Complete Complete Complete  PCP or  Specialist Appt within 5-7 Days   Complete  Home Care Screening   Complete  HRI or Home Care Consult  Complete   Social Work Consult for [place] Planning/Counseling  Complete   Palliative Care Screening  Not Applicable   Medication Review Press photographer) Complete Complete   PCP or Specialist appointment within 3-5 days of discharge Complete    HRI or [place] Complete    SW Recovery Care/Counseling Consult Complete    [place] Not Applicable

## 2022-05-05 NOTE — Progress Notes (Signed)
Subjective: No complaints.  Wants to go home.  Eating ok.  Moving her bowels.  Denies any abdominal pain currently.  No further bleeding.  Objective: Vital signs in last 24 hours: Temp:  [97.4 F (36.3 C)-98.4 F (36.9 C)] 97.4 F (36.3 C) (11/13 0457) Pulse Rate:  [105-120] 117 (11/13 1159) Resp:  [17-24] 20 (11/13 0457) BP: (122-148)/(59-91) 122/83 (11/13 1159) SpO2:  [95 %-100 %] 96 % (11/13 1159) Weight:  [43.5 kg] 43.5 kg (11/12 1844) Last BM Date : 05/03/22  Intake/Output from previous day: 11/12 0701 - 11/13 0700 In: 881.2 [P.O.:720; IV Piggyback:161.2] Out: 0  Intake/Output this shift: Total I/O In: 237 [P.O.:237] Out: -   PE: General: Alert, NAD Lungs: Respiratory effort nonlabored on room air Abd: soft, non-distended, bowel sounds present. Midline incision cdi with retention sutures intact with small amount of erythema around some of the retention sutures.  No purulent drainage.  Small amount of induration at lowest retention suture.     Lab Results:  Recent Labs    05/04/22 0415 05/05/22 0314  WBC 18.9* 13.8*  HGB 13.4 12.5  HCT 40.6 39.1  PLT 565* 488*   BMET Recent Labs    05/04/22 0415 05/05/22 0314  NA 139 139  K 3.9 4.3  CL 98 101  CO2 28 31  GLUCOSE 128* 126*  BUN <5* 8  CREATININE 0.36* 0.57  CALCIUM 8.7* 8.8*   PT/INR Recent Labs    05/04/22 0415  LABPROT 12.1  INR 0.9   CMP     Component Value Date/Time   NA 139 05/05/2022 0314   K 4.3 05/05/2022 0314   CL 101 05/05/2022 0314   CO2 31 05/05/2022 0314   GLUCOSE 126 (H) 05/05/2022 0314   BUN 8 05/05/2022 0314   CREATININE 0.57 05/05/2022 0314   CREATININE 0.57 03/28/2021 0000   CALCIUM 8.8 (L) 05/05/2022 0314   PROT 6.5 05/05/2022 0314   ALBUMIN 2.4 (L) 05/05/2022 0314   AST 8 (L) 05/05/2022 0314   ALT 18 05/05/2022 0314   ALKPHOS 109 05/05/2022 0314   BILITOT 0.1 (L) 05/05/2022 0314   GFRNONAA >60 05/05/2022 0314   GFRAA >60 08/03/2018 1514   Lipase      Component Value Date/Time   LIPASE 40 04/17/2022 1018    Studies/Results: CT ABDOMEN PELVIS W CONTRAST  Result Date: 05/04/2022 CLINICAL DATA:  56 year old female with pain and possible sepsis. Ileus versus small-bowel obstruction on CT last month, 2 abdominal surgeries since that time, including - 04/18/2022 resection of adhesions and resection of a distal small bowel stricture in the right lower quadrant. - exploratory laparotomy on 04/24/2022 with negative surgical evaluation of the small and large bowel at that time. EXAM: CT ABDOMEN AND PELVIS WITH CONTRAST TECHNIQUE: Multidetector CT imaging of the abdomen and pelvis was performed using the standard protocol following bolus administration of intravenous contrast. RADIATION DOSE REDUCTION: This exam was performed according to the departmental dose-optimization program which includes automated exposure control, adjustment of the mA and/or kV according to patient size and/or use of iterative reconstruction technique. CONTRAST:  69m OMNIPAQUE IOHEXOL 350 MG/ML SOLN COMPARISON:  CT Abdomen and Pelvis 04/17/2022 and earlier. FINDINGS: Lower chest: Negative lung bases. No cardiomegaly or pericardial effusion. Hepatobiliary: Negative liver and gallbladder. Pancreas: Chronic main pancreatic ductal enlargement, new since 2019. No pancreatic atrophy or inflammation identified. Spleen: Diminutive, negative. Adrenals/Urinary Tract: Normal adrenal glands. Symmetric renal enhancement and contrast excretion. No nephrolithiasis or convincing pararenal  inflammation. Unremarkable bladder. Occasional chronic pelvic phleboliths are stable since 2019. Stomach/Bowel: Chronic gastric bypass, gastrojejunostomy Roux-en-Y type changes. No free air or free fluid identified. No fluid collection identified in the abdomen or pelvis. No dilated large or small bowel loops today. However, there is generalized bowel wall thickening involving multiple both proximal and distal small  bowel loops (including the duodenum on series 3, image 35) and also the large bowel from the distal descending colon through the rectosigmoid (series 3, image 63). Upstream retained low-density stool and gas in the proximal colon. New distal small bowel anastomosis in the right lower quadrant since the CT last month consistent with surgical history. No evidence of obstruction. Vascular/Lymphatic: Aortoiliac calcified atherosclerosis. Major arterial structures in the abdomen and pelvis remain patent. Portal venous system appears patent. No lymphadenopathy identified. Reproductive: Other: No pelvic free fluid. Musculoskeletal: Partially visible chronic T9 compression fracture. Stable visualized osseous structures. Recent postoperative changes to the ventral abdominal wall with no adverse features. IMPRESSION: 1. Interval laparotomy and resected distal small bowel stricture with primary reanastomosis. Underlying chronic Roux-en-Y type gastric bypass. 2. Widespread thickened but nondilated small bowel loops today, with similar generalized large bowel thickening from the distal descending colon to the rectum. This is nonspecific, but consider infectious or inflammatory enterocolitis. No evidence of bowel obstruction. No postoperative abscess. 3. Otherwise stable CT appearance of the abdomen and pelvis. Electronically Signed   By: Genevie Ann M.D.   On: 05/04/2022 07:30   DG Chest Port 1 View  Result Date: 05/04/2022 CLINICAL DATA:  56 year old female with possible sepsis.  Pain. EXAM: PORTABLE CHEST 1 VIEW COMPARISON:  CTA chest 04/04/2022 and earlier. FINDINGS: Portable AP upright view at 0419 hours. Lung volumes and mediastinal contours are stable and within normal limits. Allowing for portable technique the lungs are clear. No pneumothorax or pleural effusion. Visualized tracheal air column is within normal limits. Negative visible bowel gas and osseous structures; chronic T9 compression fracture better demonstrated  by CT. IMPRESSION: Negative portable chest. Electronically Signed   By: Genevie Ann M.D.   On: 05/04/2022 05:24    Anti-infectives: Anti-infectives (From admission, onward)    Start     Dose/Rate Route Frequency Ordered Stop   05/04/22 0845  piperacillin-tazobactam (ZOSYN) IVPB 3.375 g        3.375 g 12.5 mL/hr over 240 Minutes Intravenous Every 8 hours 05/04/22 0840          Assessment/Plan SBO H/o gastric bypass 2017 Dr. Excell Seltzer, history of laparoscopic hysterectomy - CT 10/13 w/ SBO and TP poster pelvis. Mild twisting of mesentery within RLQ. Small HH -Admitted to hospitalist service 10/13 through 10/20 for SBO managed conservatively.  NG tube removed during admission but obstruction did not completely resolve.  She left AMA 10/20 after recommendation for continued n.p.o. status and NG tube replacement -CT 10/24 and 10/26 done without oral contrast showing diffuse small bowel distention concerning for ileus versus SBO however favor pSBO given history   POD#17 s/p exploratory laparotomy/ small bowel resection/ lysis of adhesions 04/18/22 - Dr. Brantley Stage -Intraop findings of Small bowel stricture POD#11 s/p ex lap abdominal closure w/ retention sutures for wound dehiscence 04/24/22 Dr. Grandville Silos  -Surgical path without IBD or neoplasm. Fibrous adhesion and benign mesothelial cell proliferation -CT abdomen pelvis 11/12 w/ post-operative changes, SB anastomosis appears patent and no obstruction. There is intraabdominal fluid collection and no abd wall fluid collection. There is generalized wall thickening of the bowel that was present on previous scans as  well.  - WBC down to 13K today.  AF.  Eating well with minimal abdominal pain -some erythema and edema around lower retention suture and mild erythema around a couple of the other sutures.   -reasonable to give 5 days of augmentin for this.  No intra-abdominal etiology present.   -surgically stable for DC home from our standpoint -sent a  message to primary service.   FEN: Soft, daily miralax and BID colace. IVF per primary; encourage snacks high in protein. ID: [id number]  VTE: lovenox  pain: schedule tylenol, continue PRN oxy, add robaxin for pain.  Alcohol use  Tobacco use DM GERD COPD Afib  HTN   LOS: 1 day    [name] , [facility] 05/05/2022, 3:57 PM Please see Amion for pager number during day hours 7:00am-4:30pm

## 2022-05-05 NOTE — Progress Notes (Signed)
Mobility Specialist Progress Note:   05/05/22 1517  Mobility  Activity Ambulated independently in hallway  Level of Assistance Independent  Assistive Device None  Distance Ambulated (ft) 400 ft  Activity Response Tolerated well  Mobility Referral Yes  $Mobility charge 1 Mobility   Pt received in bathroom and eager. No complaints. Pt left in bed with all needs met, call bell in reach, and bed alarm on.   Andrey Campanile Mobility Specialist Please contact via SecureChat or  Rehab office at [phone]

## 2022-05-06 ENCOUNTER — Other Ambulatory Visit (HOSPITAL_COMMUNITY): Payer: Self-pay

## 2022-05-06 DIAGNOSIS — T8149XA Infection following a procedure, other surgical site, initial encounter: Secondary | ICD-10-CM | POA: Diagnosis not present

## 2022-05-06 LAB — CBC
HCT: 35.1 % — ABNORMAL LOW (ref 36.0–46.0)
Hemoglobin: 11.7 g/dL — ABNORMAL LOW (ref 12.0–15.0)
MCH: 29.3 pg (ref 26.0–34.0)
MCHC: 33.3 g/dL (ref 30.0–36.0)
MCV: 87.8 fL (ref 80.0–100.0)
Platelets: 428 10*3/uL — ABNORMAL HIGH (ref 150–400)
RBC: 4 MIL/uL (ref 3.87–5.11)
RDW: 14.2 % (ref 11.5–15.5)
WBC: 11.5 10*3/uL — ABNORMAL HIGH (ref 4.0–10.5)
nRBC: 0 % (ref 0.0–0.2)

## 2022-05-06 LAB — COMPREHENSIVE METABOLIC PANEL
ALT: 14 U/L (ref 0–44)
AST: 13 U/L — ABNORMAL LOW (ref 15–41)
Albumin: 2.3 g/dL — ABNORMAL LOW (ref 3.5–5.0)
Alkaline Phosphatase: 79 U/L (ref 38–126)
Anion gap: 8 (ref 5–15)
BUN: 5 mg/dL — ABNORMAL LOW (ref 6–20)
CO2: 29 mmol/L (ref 22–32)
Calcium: 8.4 mg/dL — ABNORMAL LOW (ref 8.9–10.3)
Chloride: 103 mmol/L (ref 98–111)
Creatinine, Ser: 0.41 mg/dL — ABNORMAL LOW (ref 0.44–1.00)
GFR, Estimated: >60 mL/min (ref >60)
Glucose, Bld: 181 mg/dL — ABNORMAL HIGH (ref 70–99)
Potassium: 4 mmol/L (ref 3.5–5.1)
Sodium: 140 mmol/L (ref 135–145)
Total Bilirubin: 0.2 mg/dL — ABNORMAL LOW (ref 0.3–1.2)
Total Protein: 6.4 g/dL — ABNORMAL LOW (ref 6.5–8.1)

## 2022-05-06 MED ORDER — AMOXICILLIN-POT CLAVULANATE 875-125 MG PO TABS
1.0000 | ORAL_TABLET | Freq: Two times a day (BID) | ORAL | 0 refills | Status: AC
  Filled 2022-05-06: qty 8, 4d supply, fill #0

## 2022-05-06 MED ORDER — TRAMADOL HCL 50 MG PO TABS
50.0000 mg | ORAL_TABLET | Freq: Two times a day (BID) | ORAL | 0 refills | Status: DC | PRN
  Filled 2022-05-06: qty 20, 10d supply, fill #0

## 2022-05-06 NOTE — Discharge Summary (Signed)
Physician Discharge Summary  SHAKYIA BOSSO GYI:[id number] DOB: 1966/04/25 DOA: 05/04/2022  PCP: [name], MD  Admit date: 05/04/2022 Discharge date: 05/06/2022  Time spent: 33 minutes  Recommendations for Outpatient Follow-up:  New medication Augmentin to complete in the outpatient setting, refilled tramadol Needs outpatient surgical follow-up Recommend CBC, Chem-12 in 1 week  Discharge Diagnoses:  MAIN problem for hospitalization   Superficial wound infection in the setting of recent small bowel resection with retention sutures placed Sinus tachycardia COPD continued smoker Prior cocaine abuse etc.  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 Excell Seltzer COPD Lengthy hospitalization 7/23 through 8/23 intentional overdose admitted to [facility] hospitalization complicated by ileus-readmitted 10/13-10/21 for SBO left AMA return to hospital 10/26 through 11/7 and underwent lysis of adhesions small bowel resection 04/18/2022 and had retention sutures placed on 11/12     Return to ED with tachycardia tachypnea White count 18 and erythema warmth purulent drainage around wound from recent small bowel obstruction CT scan?  Inflammatory infectious enterocolitis patient started on Zosyn General surgery consulted but given lack of any drainable abscess etc. feel can manage conservatively  Hospital Course:  Cellulitis erythema surrounding recent SBO surgery Prior ileus, prior laparoscopic  gastric bypass 2017 -No current indication for operative management per [facility], white count is improved - Zosyn was initiated on admission but was transitioned rapidly on 11/13 to Augmentin as general surgery reviewed the patient and felt this was in keeping with cellulitis with no discernible abscess - Refill the patient's tramadol as was close to running out-advised that she can take this medication scheduled for pain with Tylenol for breakthrough and for severe pain use oxycodone   Concern for EtOH withdrawal?   Schizophrenia bipolar and drug abuse - CIWA protocol was discontinued as patient's scores were below 5   Sinus tachycardia - Secondary to anxiety +/- infection Trends more more consistent with her anxiety and patient was just monitored for this-   COPD continued smoker -Continue NicoDerm patch daily  Discharge Exam: Vitals:   05/06/22 0641 05/06/22 0849  BP: 135/86 136/87  Pulse: 98 94  Resp: 18 18  Temp: 97.7 F (36.5 C) 97.7 F (36.5 C)  SpO2: 99% 97%    Subj on day of d/c   Awake coherent ambulatory no distress states some mild abdominal pain I discussed with patient's daughter on the phone  General Exam on discharge  EOMI NCAT no focal deficit no icterus no pallor no rales Abdomen examined slight distention no rebound no guarding retention sutures look clean no cellulitis no erythema mild loose but seems much better than yesterday  Discharge Instructions   Discharge Instructions     Diet - low sodium heart healthy   Complete by: As directed    Discharge instructions   Complete by: As directed    Please complete the full course of Augmentin which will help clear up the inflammation/infection of your lower abdomen We will prescribe short course of tramadol for you to use for pain you can use a scheduled  around-the-clock and Tylenol for breakthrough for severe pain use oxycodone You may use Robaxin scheduled and complete this Follow-up in the outpatient  setting with general surgery   Increase activity slowly   Complete by: As directed    Leave dressing on - Keep it clean, dry, and intact until clinic visit   Complete by: As directed       Allergies as of 05/06/2022       Reactions   Iron Dextran Shortness Of Breath, Anxiety   Aspirin Nausea And Vomiting, Other (See Comments)   Ok to take tylenol or ibuprofen        Medication List     STOP taking these medications    diphenhydramine-acetaminophen 25-500 MG Tabs tablet Commonly known as: TYLENOL PM   oxyCODONE 5 MG immediate release tablet Commonly known as: Oxy IR/ROXICODONE       TAKE these medications    acetaminophen 500 MG tablet Commonly known as: TYLENOL Take 2 tablets (1,000 mg total) by mouth every 6 (six) hours as needed for mild pain.   albuterol 108 (90 Base) MCG/ACT inhaler Commonly known as: ProAir HFA Inhale 2 puffs into the lungs every 6 (six) hours as needed for wheezing or shortness of breath.   amoxicillin-clavulanate 875-125 MG tablet Commonly known as: AUGMENTIN Take 1 tablet by mouth every 12 (twelve) hours for 4 days.   bisacodyl 10 MG suppository Commonly known as: DULCOLAX Place 1 suppository (10 mg total) rectally daily as needed for moderate constipation.   docusate sodium 100 MG capsule Commonly known as: COLACE Take 1 capsule (100 mg total) by mouth 2 (two) times daily.   folic acid 1 MG tablet Commonly known as: FOLVITE Take 1 tablet (1 mg total) by mouth daily.   ipratropium-albuterol 0.5-2.5 (3) MG/3ML Soln Commonly known as: DUONEB Take 3 mLs by nebulization every 6 (six) hours as needed.   methocarbamol 750 MG tablet Commonly known as: ROBAXIN Take 1 tablet (750 mg total) by mouth every 6 (six) hours as needed for muscle spasms.   naloxone 4 MG/0.1ML Liqd nasal spray kit Commonly known as: NARCAN Place 1 spray into the nose once.   nicotine 21 mg/24hr patch Commonly known as: NICODERM CQ - dosed in mg/24  hours Place 1 patch (21 mg total) onto the skin daily.   ondansetron 4 MG tablet Commonly known as: ZOFRAN Take 1 tablet (4 mg total) by mouth every 6 (six) hours as needed for nausea.   traMADol 50 MG tablet Commonly known as: ULTRAM Take 1 tablet (50 mg total) by mouth every 12 (twelve) hours as needed for severe pain (pain not relieved by oxycodone).               Discharge Care Instructions  (From admission, onward)           Start     Ordered   05/06/22 0000  Leave dressing on - Keep it clean, dry, and intact until clinic visit        05/06/22 0858           Allergies  Allergen Reactions   Iron Dextran Shortness Of Breath and Anxiety   Aspirin Nausea And Vomiting and Other (See Comments)    Ok to take tylenol or ibuprofen       The results of significant diagnostics from this hospitalization (including imaging, microbiology, ancillary and laboratory) are listed below for reference.    Significant Diagnostic Studies: CT ABDOMEN PELVIS W CONTRAST  Result Date:  05/04/2022 CLINICAL DATA:  56 year old female with pain and possible sepsis. Ileus versus small-bowel obstruction on CT last month, 2 abdominal surgeries since that time, including - 04/18/2022 resection of adhesions and resection of a distal small bowel stricture in the right lower quadrant. - exploratory laparotomy on 04/24/2022 with negative surgical evaluation of the small and large bowel at that time. EXAM: CT ABDOMEN AND PELVIS WITH CONTRAST TECHNIQUE: Multidetector CT imaging of the abdomen and pelvis was performed using the standard protocol following bolus administration of intravenous contrast. RADIATION DOSE REDUCTION: This exam was performed according to the departmental dose-optimization program which includes automated exposure control, adjustment of the mA and/or kV according to patient size and/or use of iterative reconstruction technique. CONTRAST:  87m OMNIPAQUE IOHEXOL 350 MG/ML SOLN  COMPARISON:  CT Abdomen and Pelvis 04/17/2022 and earlier. FINDINGS: Lower chest: Negative lung bases. No cardiomegaly or pericardial effusion. Hepatobiliary: Negative liver and gallbladder. Pancreas: Chronic main pancreatic ductal enlargement, new since 2019. No pancreatic atrophy or inflammation identified. Spleen: Diminutive, negative. Adrenals/Urinary Tract: Normal adrenal glands. Symmetric renal enhancement and contrast excretion. No nephrolithiasis or convincing pararenal inflammation. Unremarkable bladder. Occasional chronic pelvic phleboliths are stable since 2019. Stomach/Bowel: Chronic gastric bypass, gastrojejunostomy Roux-en-Y type changes. No free air or free fluid identified. No fluid collection identified in the abdomen or pelvis. No dilated large or small bowel loops today. However, there is generalized bowel wall thickening involving multiple both proximal and distal small bowel loops (including the duodenum on series 3, image 35) and also the large bowel from the distal descending colon through the rectosigmoid (series 3, image 63). Upstream retained low-density stool and gas in the proximal colon. New distal small bowel anastomosis in the right lower quadrant since the CT last month consistent with surgical history. No evidence of obstruction. Vascular/Lymphatic: Aortoiliac calcified atherosclerosis. Major arterial structures in the abdomen and pelvis remain patent. Portal venous system appears patent. No lymphadenopathy identified. Reproductive: Other: No pelvic free fluid. Musculoskeletal: Partially visible chronic T9 compression fracture. Stable visualized osseous structures. Recent postoperative changes to the ventral abdominal wall with no adverse features. IMPRESSION: 1. Interval laparotomy and resected distal small bowel stricture with primary reanastomosis. Underlying chronic Roux-en-Y type gastric bypass. 2. Widespread thickened but nondilated small bowel loops today, with similar  generalized large bowel thickening from the distal descending colon to the rectum. This is nonspecific, but consider infectious or inflammatory enterocolitis. No evidence of bowel obstruction. No postoperative abscess. 3. Otherwise stable CT appearance of the abdomen and pelvis. Electronically Signed   By: HGenevie AnnM.D.   On: 05/04/2022 07:30   DG Chest Port 1 View  Result Date: 05/04/2022 CLINICAL DATA:  56year old female with possible sepsis.  Pain. EXAM: PORTABLE CHEST 1 VIEW COMPARISON:  CTA chest 04/04/2022 and earlier. FINDINGS: Portable AP upright view at 0419 hours. Lung volumes and mediastinal contours are stable and within normal limits. Allowing for portable technique the lungs are clear. No pneumothorax or pleural effusion. Visualized tracheal air column is within normal limits. Negative visible bowel gas and osseous structures; chronic T9 compression fracture better demonstrated by CT. IMPRESSION: Negative portable chest. Electronically Signed   By: HGenevie AnnM.D.   On: 05/04/2022 05:24   DG Abdomen 1 View  Result Date: 04/17/2022 CLINICAL DATA:  Status post NG placement. EXAM: ABDOMEN - 1 VIEW COMPARISON:  Earlier radiograph dated 04/17/2022. FINDINGS: Interval advancement of the enteric tube with tip in the left upper abdomen, likely in the region of the gastric body.  Dilated air-filled loops of small bowel again noted. IMPRESSION: Interval advancement of the enteric tube with tip in the region of the gastric body. Electronically Signed   By: Anner Crete M.D.   On: 04/17/2022 23:58   DG Abd Portable 1V  Result Date: 04/17/2022 CLINICAL DATA:  Enteric catheter placement EXAM: PORTABLE ABDOMEN - 1 VIEW COMPARISON:  04/11/2022 FINDINGS: Frontal view of the lower chest and upper abdomen demonstrates enteric catheter passing below diaphragm, tip projecting over the gastric body and side port projecting at the gastroesophageal junction. Persistent gaseous distention of the bowel, with  slight decrease in caliber since prior x-ray. Lung bases are clear. IMPRESSION: 1. Enteric catheter tip projecting over the gastric body. Electronically Signed   By: Randa Ngo M.D.   On: 04/17/2022 16:09   CT ABDOMEN PELVIS W CONTRAST  Result Date: 04/17/2022 CLINICAL DATA:  Small bowel obstruction. EXAM: CT ABDOMEN AND PELVIS WITH CONTRAST TECHNIQUE: Multidetector CT imaging of the abdomen and pelvis was performed using the standard protocol following bolus administration of intravenous contrast. RADIATION DOSE REDUCTION: This exam was performed according to the departmental dose-optimization program which includes automated exposure control, adjustment of the mA and/or kV according to patient size and/or use of iterative reconstruction technique. COMPARISON:  04/15/2022 FINDINGS: Lower chest: Unremarkable. Hepatobiliary: Insert normal infused liver There is no evidence for gallstones, gallbladder wall thickening, or pericholecystic fluid. No intrahepatic or extrahepatic biliary dilation. Pancreas: No focal mass lesion. No dilatation of the main duct. No intraparenchymal cyst. No peripancreatic edema. Spleen: No splenomegaly. No focal mass lesion. Adrenals/Urinary Tract: No adrenal nodule or mass. Kidneys unremarkable. No evidence for hydroureter. The urinary bladder appears normal for the degree of distention. Stomach/Bowel: Status post gastric bypass. Similar appearance of diffuse small bowel distension. Terminal ileum is decompressed. There is mesenteric edema in the lower abdomen and pelvis with scattered areas of interloop mesenteric fluid. Small bowel loop in the left pelvis shows fecalized enteric contents and is distended up to 3.9 cm diameter. Multiple nondilated loops in the right abdomen show apparent mural edema/thickening. Old no evidence for pneumatosis. Colon is diffusely distended with gas and stool. Vascular/Lymphatic: There is mild atherosclerotic calcification of the abdominal aorta  without aneurysm. There is no gastrohepatic or hepatoduodenal ligament lymphadenopathy. No retroperitoneal or mesenteric lymphadenopathy. No pelvic sidewall lymphadenopathy. Reproductive: No adnexal mass. Other: Small volume free fluid seen in the pelvis. Musculoskeletal: Small bilateral groin hernias contain only fat. No worrisome lytic or sclerotic osseous abnormality. T9 compression deformity is stable. IMPRESSION: 1. Similar appearance of diffuse small bowel distension with mesenteric edema in the lower abdomen and pelvis with scattered areas of interloop mesenteric fluid. Small bowel loop in the left pelvis shows fecalized enteric contents and is distended up to 3.9 cm diameter. Multiple nondilated loops in the right abdomen show apparent mural edema/thickening. As before, assessment is limited by lack of intra-abdominal fat. Imaging features could be related to ileus although distal small bowel obstruction cannot be excluded. No evidence for pneumatosis in the small bowel or colon. No intraperitoneal free air. No evidence for intraperitoneal abscess. 2. Small volume free fluid in the pelvis. 3. Status post gastric bypass. 4. Stable T9 compression deformity. 5.  Aortic Atherosclerosis (ICD10-I70.0). Electronically Signed   By: Misty Stanley M.D.   On: 04/17/2022 13:02   CT ABDOMEN PELVIS W CONTRAST  Result Date: 04/15/2022 CLINICAL DATA:  Bowel obstruction suspected. EXAM: CT ABDOMEN AND PELVIS WITH CONTRAST TECHNIQUE: Multidetector CT imaging of the abdomen and pelvis  was performed using the standard protocol following bolus administration of intravenous contrast. RADIATION DOSE REDUCTION: This exam was performed according to the departmental dose-optimization program which includes automated exposure control, adjustment of the mA and/or kV according to patient size and/or use of iterative reconstruction technique. CONTRAST:  2m OMNIPAQUE IOHEXOL 350 MG/ML SOLN COMPARISON:  Multiple priors including CT  abdomen pelvis April 11, 2022. FINDINGS: Lower chest: No acute abnormality. Hepatobiliary: No suspicious hepatic lesion. Gallbladder is nondistended. No biliary ductal dilation. Pancreas: No pancreatic ductal dilation or evidence of acute inflammation. Spleen: No splenomegaly. Adrenals/Urinary Tract: Bilateral adrenal glands appear normal. No hydronephrosis. Kidneys demonstrate symmetric enhancement. Urinary bladder is unremarkable for degree of distension. Stomach/Bowel: Small hiatal hernia. Surgical changes of gastric bypass. Dilated loops of large and small bowel throughout the abdomen with decompressed descending colon and rectum. No abrupt transition point to decompressed bowel identified. Fecalized loops of small bowel with large volume of formed stool in the proximal/mid colon. Vascular/Lymphatic: Aortic atherosclerosis. No pathologically enlarged abdominal or pelvic lymph nodes. Reproductive: No acute abnormality. Other: Small volume pelvic free fluid. Surgical change in the anterior abdominal wall. Soft tissue nodule in the right subcutaneous abdominal wall is similar prior and may reflect sequela of subcutaneous injections. Nonspecific body wall edema is unchanged. Musculoskeletal: No acute osseous abnormality. IMPRESSION: 1. Dilated loops of large and small bowel throughout the abdomen with decompressed descending colon and rectum. There are multiple interposed loops of dilated/distended and mildly dilated/decompressed of bowel throughout the abdomen without abrupt transition point to decompressed bowel identified, although admittedly evaluation is difficult given the relative paucity of peritoneal fat and extensive distension/dilation of bowel. Findings are favored to reflect ileus. However, early/partial bowel obstruction is not excluded. Consider repeat CT imaging with oral contrast medium to aid in evaluation. 2. Fecalized loops of small bowel as can be seen with slow transit with large volume of  stool in the proximal/mid colon suggestive of constipation. 3. Small volume pelvic free fluid, likely reactive. 4.  Aortic Atherosclerosis (ICD10-I70.0). Electronically Signed   By: JDahlia BailiffM.D.   On: 04/15/2022 18:03   [place]KG SITE RITE  Result Date: 04/11/2022 If Site Rite image not attached, placement could not be confirmed due to current cardiac rhythm.  DG Abd Portable 1V  Result Date: 04/11/2022 CLINICAL DATA:  Abdominal pain and distension, small-bowel obstruction EXAM: PORTABLE ABDOMEN - 1 VIEW COMPARISON:  CT done on 04/11/2022 FINDINGS: There is marked dilation of small-bowel loops measuring up to 5.6 cm. Stomach is not distended. Colon is not distended. There is contrast in the collecting systems in the kidneys and urinary bladder from previous CT. IMPRESSION: There is marked dilation of small-bowel loops suggesting high-grade small bowel obstruction. Electronically Signed   By: PElmer PickerM.D.   On: 04/11/2022 14:13   CT ABDOMEN PELVIS W CONTRAST  Result Date: 04/11/2022 CLINICAL DATA:  Small bowel obstruction EXAM: CT ABDOMEN AND PELVIS WITH CONTRAST TECHNIQUE: Multidetector CT imaging of the abdomen and pelvis was performed using the standard protocol following bolus administration of intravenous contrast. RADIATION DOSE REDUCTION: This exam was performed according to the departmental dose-optimization program which includes automated exposure control, adjustment of the mA and/or kV according to patient size and/or use of iterative reconstruction technique. CONTRAST:  733mOMNIPAQUE IOHEXOL 350 MG/ML SOLN COMPARISON:  Radiograph 04/08/2022, CT 04/05/2022 FINDINGS: Lower chest: Lung bases are clear. Hepatobiliary: No focal hepatic lesion. No biliary duct dilatation. Common bile duct is normal. Pancreas: Pancreas is normal. No ductal  dilatation. No pancreatic inflammation. Spleen: Normal spleen Adrenals/urinary tract: Adrenal glands and kidneys are normal. The ureters and  bladder normal. Stomach/Bowel: Post gastric bypass anatomy. Contrast flows through the gastrojejunostomy. The small bowel just distal the gastrojejunostomy is relatively normal caliber. As contrast progresses through the bowel there is marked distension of the distal small bowel up to 3.6 cm in the LEFT abdomen and RIGHT abdomen. Enteric enteric anastomosis in the LEFT mid abdomen is patent (image 40/3). The most distal loops of small bowel in the pelvis do not have oral contrast and distended up to 4.7 cm (image 69/3). Small bowel entering the terminal ileum is decompressed 1.4 cm (image 66/3). Transition point appears to be in the mid pelvis. No portal venous gas. Pneumatosis intestinalis. No intraperitoneal free air. Colon is collapsed. Vascular/Lymphatic: Abdominal aorta is normal caliber with atherosclerotic calcification. There is no retroperitoneal or periportal lymphadenopathy. No pelvic lymphadenopathy. Reproductive: Unremarkable Other: Small volume free fluid the pelvis Musculoskeletal: No aggressive osseous lesion. IMPRESSION: 1. Mechanical small bowel obstruction. Obstruction appears in the distal small bowel of the mid pelvis. 2. Post gastric bypass Roux-en-Y anatomy. No evidence obstruction at the gastrojejunostomy or enteric enteric anastomosis. These results will be called to the ordering clinician or representative by the Radiologist Assistant, and communication documented in the PACS or [employer]. Electronically Signed   By: Suzy Bouchard M.D.   On: 04/11/2022 12:43   DG Abd Portable 1V  Result Date: 04/08/2022 CLINICAL DATA:  Small bowel obstruction. EXAM: PORTABLE ABDOMEN - 1 VIEW COMPARISON:  April 07, 2022. FINDINGS: Nasogastric tube tip is seen in expected position of distal stomach. Residual contrast is noted in the nondilated colon. Slightly improved small bowel dilatation is noted consistent with ileus or obstruction. IMPRESSION: Slightly improved small bowel dilatation is  noted as described above. Electronically Signed   By: Marijo Conception M.D.   On: 04/08/2022 08:27   DG Abd 1 View  Result Date: 04/07/2022 CLINICAL DATA:  Small bowel obstruction.  NG tube present. EXAM: ABDOMEN - 1 VIEW COMPARISON:  One-view abdomen 04/06/2022 FINDINGS: NG tube is stable position within the stomach. Persistent dilated loops of small bowel are present throughout the abdomen. Slow transit of contrast is now present in the more distal small bowel. IMPRESSION: Persistent dilated loops of small bowel throughout the abdomen with slow transit of contrast in the more distal small bowel. Findings compatible with diffuse ileus versus partial obstruction. Electronically Signed   By: San Morelle M.D.   On: 04/07/2022 07:33   DG Abd 1 View  Result Date: 04/06/2022 CLINICAL DATA:  Small bowel obstruction. EXAM: ABDOMEN - 1 VIEW COMPARISON:  04/05/2022 FINDINGS: Diffuse gaseous small bowel dilatation persists, similar to prior. Contrast material remains within small bowel loops of the pelvis. No evidence for colonic contrast. IMPRESSION: Persistent diffuse gaseous small bowel dilatation. Contrast material remains within small bowel loops of the pelvis with no colonic contrast yet visible. Electronically Signed   By: Misty Stanley M.D.   On: 04/06/2022 12:42    Microbiology: Recent Results (from the past 240 hour(s))  Urine Culture     Status: Abnormal   Collection Time: 05/04/22  3:59 AM   Specimen: In/Out Cath Urine  Result Value Ref Range Status   Specimen Description IN/OUT CATH URINE  Final   Special Requests   Final    NONE Performed at [facility], [address], [place], [place] [address]    Culture MULTIPLE SPECIES PRESENT, SUGGEST  RECOLLECTION (A)  Final   Report Status 05/05/2022 FINAL  Final  Blood Culture (routine x 2)     Status: None (Preliminary result)   Collection Time: 05/04/22  4:05 AM   Specimen: BLOOD  Result Value Ref Range Status   Specimen  Description BLOOD RIGHT ANTECUBITAL  Final   Special Requests   Final    BOTTLES DRAWN AEROBIC AND ANAEROBIC Blood Culture adequate volume   Culture   Final    NO GROWTH 1 DAY Performed at [facility], [place] [address] [place], [place] [address]    Report Status PENDING  Incomplete  Blood Culture (routine x 2)     Status: None (Preliminary result)   Collection Time: 05/04/22  4:15 AM   Specimen: BLOOD RIGHT HAND  Result Value Ref Range Status   Specimen Description BLOOD RIGHT HAND  Final   Special Requests AEROBIC BOTTLE ONLY Blood Culture adequate volume  Final   Culture   Final    NO GROWTH 1 DAY Performed at [facility], [place] [address] [place],  [address]    Report Status PENDING  Incomplete     Labs: Basic Metabolic Panel: Recent Labs  Lab 05/04/22 0415 05/05/22 0314 05/06/22 0328  NA 139 139 140  K 3.9 4.3 4.0  CL 98 101 103  CO2 _0 GLUCOSE 128* 126* 181*  BUN <5* 8 5*  CREATININE 0.36* 0.57 0.41*  CALCIUM 8.7* 8.8* 8.4*   Liver Function Tests: Recent Labs  Lab 05/04/22 0415 05/05/22 0314 05/06/22 0328  AST 12* 8* 13*  ALT _1 ALKPHOS 131* 109 79  BILITOT 0.3 0.1* 0.2*  PROT 7.0 6.5 6.4*  ALBUMIN 2.8* 2.4* 2.3*   No results for input(s): "LIPASE", "AMYLASE" in the last 168 hours. No results for input(s): "AMMONIA" in the last 168 hours. CBC: Recent Labs  Lab 05/04/22 0415 05/05/22 0314 05/06/22 0328  WBC 18.9* 13.8* 11.5*  NEUTROABS 15.6*  --   --   HGB 13.4 12.5 11.7*  HCT 40.6 39.1 35.1*  MCV 88.5 89.7 87.8  PLT 565* 488* 428*   Cardiac Enzymes: No results for input(s): "CKTOTAL", "CKMB", "CKMBINDEX", "TROPONINI" in the last 168 hours. BNP: BNP (last 3 results) Recent Labs    02/05/22 0706 02/11/22 0204 02/12/22 1048  BNP 74.0 31.7 70.9    ProBNP (last 3 results) No results for input(s): "PROBNP" in the last 8760 hours.  CBG: No results for input(s): "GLUCAP" in the last 168  hours.     Signed:  Nita Sells MD   [facility] 05/06/2022, 8:59 AM

## 2022-05-06 NOTE — Plan of Care (Signed)

## 2022-05-06 NOTE — Progress Notes (Signed)
DISCHARGE NOTE HOME Euva L Decaire to be discharged Home per MD order. Discussed prescriptions and follow up appointments with the patient. Prescriptions given to patient; medication list explained in detail. Patient verbalized understanding.  Skin clean, dry and intact without evidence of skin break down, no evidence of skin tears noted. IV catheter discontinued intact. Site without signs and symptoms of complications. Dressing and pressure applied. Pt denies pain at the site currently. No complaints noted.  Patient free of lines, drains, and wounds.   An After Visit Summary (AVS) was printed and given to the patient. Patient escorted via wheelchair, and discharged home via private auto.  Anastasio Auerbach, RN

## 2022-05-06 NOTE — TOC Transition Note (Signed)
Transition of Care [facility]) - CM/SW Discharge Note   Patient Details  Name: [name] MRN: [id number] Date of Birth: 1965-06-30  [facility] of [facility]) CM/SW Contact:  Tom-Johnson, Renea Ee, RN Phone Number: 05/06/2022, 10:37 AM   Clinical Narrative:     Patient is scheduled for discharge today. Patient to resume home health RN at discharge, Angie with [facility] notified, info on AVS. Daughter to transport at discharge. No further TOC needs noted.     Final next level of care: [place] Barriers to Discharge: Barriers Resolved   Patient Goals and CMS Choice Patient states their goals for this hospitalization and ongoing recovery are:: To return home CMS Medicare.gov Compare Post Acute Care list provided to:: Patient    Discharge Placement                Patient to be transferred to facility by: Daughter      Discharge Plan and Services   Discharge Planning Services: CM Consult Post Acute Care Choice: Home Health          DME Arranged: N/A DME Agency: NA       HH Arranged: RN [place] Agency: [place] (Resumption of care) Date HH Agency Contacted: 05/05/22 Time [place]: 22 Representative spoke with at [place]: [place] Determinants of Health (SDOH) Interventions Food Insecurity Interventions: Intervention Not Indicated Housing Interventions: Intervention Not Indicated Transportation Interventions: Intervention Not Indicated Utilities Interventions: Intervention Not Indicated Social Connections Interventions: Intervention Not Indicated   Readmission Risk Interventions    04/28/2022    2:18 PM 04/07/2022   12:41 PM 07/27/2020   11:06 AM  Readmission Risk Prevention Plan  Transportation Screening Complete Complete Complete  PCP or Specialist Appt within 5-7 Days   Complete  Home Care Screening   Complete  [place] or Home Care Consult  Complete   Social Work Consult for [place] Planning/Counseling   Complete   Palliative Care Screening  Not Applicable   Medication Review Press photographer) Complete Complete   PCP or Specialist appointment within 3-5 days of discharge Complete    HRI or [place] Complete    SW Recovery Care/Counseling Consult Complete    [place] Not Applicable

## 2022-05-09 DIAGNOSIS — J449 Chronic obstructive pulmonary disease, unspecified: Secondary | ICD-10-CM | POA: Diagnosis not present

## 2022-05-09 DIAGNOSIS — T8141XA Infection following a procedure, superficial incisional surgical site, initial encounter: Secondary | ICD-10-CM | POA: Diagnosis not present

## 2022-05-09 DIAGNOSIS — T8131XA Disruption of external operation (surgical) wound, not elsewhere classified, initial encounter: Secondary | ICD-10-CM | POA: Diagnosis not present

## 2022-05-09 DIAGNOSIS — E1140 Type 2 diabetes mellitus with diabetic neuropathy, unspecified: Secondary | ICD-10-CM | POA: Diagnosis not present

## 2022-05-09 LAB — CULTURE, BLOOD (ROUTINE X 2)
Culture: NO GROWTH
Culture: NO GROWTH
Special Requests: ADEQUATE
Special Requests: ADEQUATE

## 2022-05-13 DIAGNOSIS — J449 Chronic obstructive pulmonary disease, unspecified: Secondary | ICD-10-CM | POA: Diagnosis not present

## 2022-05-14 DIAGNOSIS — E1140 Type 2 diabetes mellitus with diabetic neuropathy, unspecified: Secondary | ICD-10-CM | POA: Diagnosis not present

## 2022-05-14 DIAGNOSIS — J449 Chronic obstructive pulmonary disease, unspecified: Secondary | ICD-10-CM | POA: Diagnosis not present

## 2022-05-14 DIAGNOSIS — T8131XA Disruption of external operation (surgical) wound, not elsewhere classified, initial encounter: Secondary | ICD-10-CM | POA: Diagnosis not present

## 2022-05-14 DIAGNOSIS — T8141XA Infection following a procedure, superficial incisional surgical site, initial encounter: Secondary | ICD-10-CM | POA: Diagnosis not present

## 2022-05-16 ENCOUNTER — Emergency Department (HOSPITAL_COMMUNITY): Payer: Medicare Other

## 2022-05-16 ENCOUNTER — Other Ambulatory Visit: Payer: Self-pay

## 2022-05-16 ENCOUNTER — Encounter (HOSPITAL_COMMUNITY): Payer: Self-pay

## 2022-05-16 ENCOUNTER — Emergency Department (HOSPITAL_COMMUNITY)
Admission: EM | Admit: 2022-05-16 | Discharge: 2022-05-16 | Discharge status: Against Medical Advice | Payer: Medicare Other | Attending: Emergency Medicine | Admitting: Emergency Medicine

## 2022-05-16 DIAGNOSIS — G8918 Other acute postprocedural pain: Secondary | ICD-10-CM | POA: Diagnosis not present

## 2022-05-16 DIAGNOSIS — R000 Tachycardia, unspecified: Secondary | ICD-10-CM | POA: Insufficient documentation

## 2022-05-16 DIAGNOSIS — T68XXXA Hypothermia, initial encounter: Secondary | ICD-10-CM | POA: Diagnosis not present

## 2022-05-16 DIAGNOSIS — B999 Unspecified infectious disease: Secondary | ICD-10-CM

## 2022-05-16 DIAGNOSIS — R0689 Other abnormalities of breathing: Secondary | ICD-10-CM | POA: Diagnosis not present

## 2022-05-16 DIAGNOSIS — I959 Hypotension, unspecified: Secondary | ICD-10-CM | POA: Diagnosis not present

## 2022-05-16 LAB — CBC WITH DIFFERENTIAL/PLATELET
Abs Immature Granulocytes: 0.03 10*3/uL (ref 0.00–0.07)
Basophils Absolute: 0 10*3/uL (ref 0.0–0.1)
Basophils Relative: 0 %
Eosinophils Absolute: 0.3 10*3/uL (ref 0.0–0.5)
Eosinophils Relative: 3 %
HCT: 39.8 % (ref 36.0–46.0)
Hemoglobin: 13.3 g/dL (ref 12.0–15.0)
Immature Granulocytes: 0 %
Lymphocytes Relative: 22 %
Lymphs Abs: 2.4 10*3/uL (ref 0.7–4.0)
MCH: 29.3 pg (ref 26.0–34.0)
MCHC: 33.4 g/dL (ref 30.0–36.0)
MCV: 87.7 fL (ref 80.0–100.0)
Monocytes Absolute: 0.5 10*3/uL (ref 0.1–1.0)
Monocytes Relative: 4 %
Neutro Abs: 7.9 10*3/uL — ABNORMAL HIGH (ref 1.7–7.7)
Neutrophils Relative %: 71 %
Platelets: 334 10*3/uL (ref 150–400)
RBC: 4.54 MIL/uL (ref 3.87–5.11)
RDW: 15.6 % — ABNORMAL HIGH (ref 11.5–15.5)
WBC: 11.1 10*3/uL — ABNORMAL HIGH (ref 4.0–10.5)
nRBC: 0 % (ref 0.0–0.2)

## 2022-05-16 LAB — URINALYSIS, ROUTINE W REFLEX MICROSCOPIC
Bilirubin Urine: NEGATIVE
Glucose, UA: NEGATIVE mg/dL
Hgb urine dipstick: NEGATIVE
Ketones, ur: NEGATIVE mg/dL
Leukocytes,Ua: NEGATIVE
Nitrite: NEGATIVE
Protein, ur: NEGATIVE mg/dL
Specific Gravity, Urine: <1.005 — ABNORMAL LOW (ref 1.005–1.030)
pH: 6 (ref 5.0–8.0)

## 2022-05-16 LAB — COMPREHENSIVE METABOLIC PANEL
ALT: 12 U/L (ref 0–44)
AST: 12 U/L — ABNORMAL LOW (ref 15–41)
Albumin: 3.1 g/dL — ABNORMAL LOW (ref 3.5–5.0)
Alkaline Phosphatase: 93 U/L (ref 38–126)
Anion gap: 10 (ref 5–15)
BUN: <5 mg/dL — ABNORMAL LOW (ref 6–20)
CO2: 21 mmol/L — ABNORMAL LOW (ref 22–32)
Calcium: 8.8 mg/dL — ABNORMAL LOW (ref 8.9–10.3)
Chloride: 106 mmol/L (ref 98–111)
Creatinine, Ser: 0.38 mg/dL — ABNORMAL LOW (ref 0.44–1.00)
GFR, Estimated: >60 mL/min (ref >60)
Glucose, Bld: 93 mg/dL (ref 70–99)
Potassium: 3.7 mmol/L (ref 3.5–5.1)
Sodium: 137 mmol/L (ref 135–145)
Total Bilirubin: 0.4 mg/dL (ref 0.3–1.2)
Total Protein: 7 g/dL (ref 6.5–8.1)

## 2022-05-16 LAB — LACTIC ACID, PLASMA: Lactic Acid, Venous: 1.8 mmol/L (ref 0.5–1.9)

## 2022-05-16 MED ORDER — VANCOMYCIN HCL IN DEXTROSE 1-5 GM/200ML-% IV SOLN
1000.0000 mg | Freq: Once | INTRAVENOUS | Status: AC
  Administered 2022-05-16: 1000 mg via INTRAVENOUS
  Filled 2022-05-16: qty 200

## 2022-05-16 MED ORDER — CIPROFLOXACIN HCL 500 MG PO TABS: 500.0000 mg | ORAL_TABLET | Freq: Two times a day (BID) | ORAL | 0 refills | Status: DC

## 2022-05-16 MED ORDER — PIPERACILLIN-TAZOBACTAM 3.375 G IVPB 30 MIN
3.3750 g | Freq: Once | INTRAVENOUS | Status: DC
  Filled 2022-05-16: qty 50

## 2022-05-16 MED ORDER — MORPHINE SULFATE (PF) 4 MG/ML IV SOLN
4.0000 mg | Freq: Once | INTRAVENOUS | Status: AC
  Administered 2022-05-16: 4 mg via INTRAVENOUS
  Filled 2022-05-16: qty 1

## 2022-05-16 MED ORDER — PIPERACILLIN-TAZOBACTAM 3.375 G IVPB: 3.3750 g | Freq: Three times a day (TID) | INTRAVENOUS | Status: DC

## 2022-05-16 MED ORDER — DIPHENHYDRAMINE HCL 25 MG PO CAPS
25.0000 mg | ORAL_CAPSULE | Freq: Once | ORAL | Status: AC
  Administered 2022-05-16: 25 mg via ORAL
  Filled 2022-05-16: qty 1

## 2022-05-16 MED ORDER — METRONIDAZOLE 500 MG PO TABS: 500.0000 mg | ORAL_TABLET | Freq: Two times a day (BID) | ORAL | 0 refills | Status: DC

## 2022-05-16 NOTE — ED Triage Notes (Signed)
Pt BIB [facility] from home c/o abdominal pain where her surgical incision is and noticed some red and green drainage coming from the incision. Pt had surgery done 3 weeks ago d/t a small bowel obstruction.   132/78 104 94 RA 115 CBG

## 2022-05-16 NOTE — ED Notes (Signed)
Paused vancomycin due to redness and itching at IV site

## 2022-05-16 NOTE — Consult Note (Signed)
CC/Reason for consult: Possible wound infection  HPI: [name] is an 56 y.o. female hx COPD, afib, DM, with complicated surgical history:  H/o gastric bypass 2017 Dr. Excell Seltzer, history of laparoscopic hysterectomy   04/18/22 - exploratory laparotomy/ small bowel resection/ lysis of adhesions - Dr. Brantley Stage -Intraop findings of Small bowel stricture  04/24/22 s/p ex lap abdominal closure w/ retention sutures for wound dehiscence 04/24/22 Dr. [place] Silos  Readmitted with suspected 'enterocolitis' and some purulent drainage from her wound sutures but no evident abscess 05/04/22, discharged 11/14.   Return to the ER now with approximate 2-day history of redness and some purulent type drainage from her retention suture site.  No nausea or vomiting.  We were asked to see.  Past Medical History:  Diagnosis Date   Abdominal pain    Accidental drug overdose April 2013   Anxiety    Atrial fibrillation ([place]) 09/29/11   converted spontaneously   Chronic back pain    Chronic knee pain    Chronic nausea    Chronic pain    COPD (chronic obstructive pulmonary disease) ([place])    Depression    Diabetes mellitus    states her doctor took her off all DM meds in past month   Diabetic neuropathy ([name])    Dyspnea    with exertion    GERD (gastroesophageal reflux disease)    Headache(784.0)    migraines    HTN (hypertension)    not on meds since in a year    Hyperlipidemia    Hypothyroidism    not on meds in a while    Mental disorder    Bipolar and schizophrenic   Requires supplemental oxygen    as needed per patient    Schizophrenia ([place])    Schizophrenia, acute ([place]) 11/13/2017   Tobacco abuse     Past Surgical History:  Procedure Laterality Date   ABDOMINAL HYSTERECTOMY     BLADDER SUSPENSION  03/04/2011   Procedure: [facility] PROCEDURE;  Surgeon: Elayne Snare MacDiarmid;  Location: [facility];  Service: Urology;  Laterality: N/A;   BOWEL RESECTION N/A 04/18/2022   Procedure: SMALL BOWEL  RESECTION;  Surgeon: Erroll Luna, MD;  Location: [facility];  Service: General;  Laterality: N/A;   CYSTOCELE REPAIR  03/04/2011   Procedure: ANTERIOR REPAIR (CYSTOCELE);  Surgeon: Reece Packer;  Location: [facility];  Service: Urology;  Laterality: N/A;   CYSTOSCOPY  03/04/2011   Procedure: CYSTOSCOPY;  Surgeon: Elayne Snare MacDiarmid;  Location: [facility];  Service: Urology;  Laterality: N/A;   ESOPHAGOGASTRODUODENOSCOPY (EGD) WITH PROPOFOL N/A 05/12/2017   Procedure: ESOPHAGOGASTRODUODENOSCOPY (EGD) WITH PROPOFOL;  Surgeon: Alphonsa Overall, MD;  Location: [facility];  Service: General;  Laterality: N/A;   GASTRIC ROUX-EN-Y N/A 03/25/2016   Procedure: LAPAROSCOPIC ROUX-EN-Y GASTRIC BYPASS WITH UPPER ENDOSCOPY;  Surgeon: Excell Seltzer, MD;  Location: [facility];  Service: General;  Laterality: N/A;   KNEE SURGERY     LAPAROSCOPIC ASSISTED VAGINAL HYSTERECTOMY  03/04/2011   Procedure: LAPAROSCOPIC ASSISTED VAGINAL HYSTERECTOMY;  Surgeon: Cyril Mourning, MD;  Location: [facility];  Service: Gynecology;  Laterality: N/A;   LAPAROTOMY N/A 04/18/2022   Procedure: EXPLORATORY LAPAROTOMY;  Surgeon: Erroll Luna, MD;  Location: [facility];  Service: General;  Laterality: N/A;   LAPAROTOMY N/A 04/24/2022   Procedure: BRING BACK EXPLORATORY LAPAROTOMY;  Surgeon: Georganna Skeans, MD;  [facility];  Service: General;  Laterality: N/A;    Family History  Problem Relation Age of Onset   Heart  attack Father        54s   Diabetes Mother    Heart disease Mother    Hypertension Mother    Heart attack Sister        42   COPD Other    Breast cancer Neg Hx     Social:  reports that she has been smoking cigarettes. She has been smoking an average of .5 packs per day. She has never used smokeless tobacco. She reports current alcohol use. She reports that she does not currently use drugs after having used the following drugs: Cocaine, Marijuana, and "Crack" cocaine.  Allergies:  Allergies  Allergen Reactions    Iron Dextran Shortness Of Breath and Anxiety   Aspirin Nausea And Vomiting and Other (See Comments)    Ok to take tylenol or ibuprofen     Medications: I have reviewed the patient's current medications.  Results for orders placed or performed during the hospital encounter of 05/16/22 (from the past 48 hour(s))  CBC with Differential     Status: Abnormal   Collection Time: 05/16/22  1:16 PM  Result Value Ref Range   WBC 11.1 (H) 4.0 - 10.5 K/uL   RBC 4.54 3.87 - 5.11 MIL/uL   Hemoglobin 13.3 12.0 - 15.0 g/dL   HCT 39.8 36.0 - 46.0 %   MCV 87.7 80.0 - 100.0 fL   MCH 29.3 26.0 - 34.0 pg   MCHC 33.4 30.0 - 36.0 g/dL   RDW 15.6 (H) 11.5 - 15.5 %   Platelets 334 150 - 400 K/uL   nRBC 0.0 0.0 - 0.2 %   Neutrophils Relative % 71 %   Neutro Abs 7.9 (H) 1.7 - 7.7 K/uL   Lymphocytes Relative 22 %   Lymphs Abs 2.4 0.7 - 4.0 K/uL   Monocytes Relative 4 %   Monocytes Absolute 0.5 0.1 - 1.0 K/uL   Eosinophils Relative 3 %   Eosinophils Absolute 0.3 0.0 - 0.5 K/uL   Basophils Relative 0 %   Basophils Absolute 0.0 0.0 - 0.1 K/uL   Immature Granulocytes 0 %   Abs Immature Granulocytes 0.03 0.00 - 0.07 K/uL    Comment: Performed at [facility], [address], [place], [place] [address]  Comprehensive metabolic panel     Status: Abnormal   Collection Time: 05/16/22  1:16 PM  Result Value Ref Range   Sodium 137 135 - 145 mmol/L   Potassium 3.7 3.5 - 5.1 mmol/L   Chloride 106 98 - 111 mmol/L   CO2 21 (L) 22 - 32 mmol/L   Glucose, Bld 93 70 - 99 mg/dL    Comment: Glucose reference range applies only to samples taken after fasting for at least 8 hours.   BUN <5 (L) 6 - 20 mg/dL   Creatinine, Ser 0.38 (L) 0.44 - 1.00 mg/dL   Calcium 8.8 (L) 8.9 - 10.3 mg/dL   Total Protein 7.0 6.5 - 8.1 g/dL   Albumin 3.1 (L) 3.5 - 5.0 g/dL   AST 12 (L) 15 - 41 U/L   ALT 12 0 - 44 U/L   Alkaline Phosphatase 93 38 - 126 U/L   Total Bilirubin 0.4 0.3 - 1.2 mg/dL   GFR, Estimated >60 >60 mL/min     Comment: (NOTE) Calculated using the CKD-EPI Creatinine Equation (2021)    Anion gap 10 5 - 15    Comment: Performed at [facility], [place] [address], [place], [place] [address]  Urinalysis, Routine  w reflex microscopic Urine, Clean Catch     Status: Abnormal   Collection Time: 05/16/22  1:16 PM  Result Value Ref Range   Color, Urine YELLOW YELLOW   APPearance CLEAR CLEAR   Specific Gravity, Urine <1.005 (L) 1.005 - 1.030   pH 6.0 5.0 - 8.0   Glucose, UA NEGATIVE NEGATIVE mg/dL   Hgb urine dipstick NEGATIVE NEGATIVE   Bilirubin Urine NEGATIVE NEGATIVE   Ketones, ur NEGATIVE NEGATIVE mg/dL   Protein, ur NEGATIVE NEGATIVE mg/dL   Nitrite NEGATIVE NEGATIVE   Leukocytes,Ua NEGATIVE NEGATIVE    Comment: Microscopic not done on urines with negative protein, blood, leukocytes, nitrite, or glucose < 500 mg/dL. Performed at [facility], [place] [address] [place], [place] [address]   Lactic acid, plasma     Status: None   Collection Time: 05/16/22  1:39 PM  Result Value Ref Range   Lactic Acid, Venous 1.8 0.5 - 1.9 mmol/L    Comment: Performed at [facility] [address] [place],  [address]    No results found.  ROS - all of the below systems have been reviewed with the patient and positives are indicated with bold text General: chills, fever or night sweats Eyes: blurry vision or double vision ENT: epistaxis or sore throat Allergy/Immunology: itchy/watery eyes or nasal congestion Hematologic/Lymphatic: bleeding problems, blood clots or swollen lymph nodes Endocrine: temperature intolerance or unexpected weight changes Breast: new or changing breast lumps or nipple discharge Resp: cough, shortness of breath, or wheezing CV: chest pain or dyspnea on exertion GI: as per HPI GU: dysuria, trouble voiding, or hematuria MSK: joint pain or joint stiffness Neuro: TIA or stroke symptoms Derm: pruritus and skin lesion changes Psych: anxiety and  depression  PE Blood pressure (!) 154/90, pulse (!) 112, temperature 98.3 F (36.8 C), temperature source Oral, resp. rate (!) 21, height '5\' 2"'$  (1.575 m), weight 43.1 kg, last menstrual period 01/08/2011, SpO2 99 %. Constitutional: NAD; conversant Eyes: Moist conjunctiva Lungs: Normal respiratory effort CV: RRR; no palpable thrills; no pitting edema GI: Abd soft, Nontender; erythema about her retention sutures.  Midline wound without any significant separation or any visible bowel.  No evident evisceration  Psychiatric: Appropriate affect; alert and oriented x3  Results for orders placed or performed during the hospital encounter of 05/16/22 (from the past 48 hour(s))  CBC with Differential     Status: Abnormal   Collection Time: 05/16/22  1:16 PM  Result Value Ref Range   WBC 11.1 (H) 4.0 - 10.5 K/uL   RBC 4.54 3.87 - 5.11 MIL/uL   Hemoglobin 13.3 12.0 - 15.0 g/dL   HCT 39.8 36.0 - 46.0 %   MCV 87.7 80.0 - 100.0 fL   MCH 29.3 26.0 - 34.0 pg   MCHC 33.4 30.0 - 36.0 g/dL   RDW 15.6 (H) 11.5 - 15.5 %   Platelets 334 150 - 400 K/uL   nRBC 0.0 0.0 - 0.2 %   Neutrophils Relative % 71 %   Neutro Abs 7.9 (H) 1.7 - 7.7 K/uL   Lymphocytes Relative 22 %   Lymphs Abs 2.4 0.7 - 4.0 K/uL   Monocytes Relative 4 %   Monocytes Absolute 0.5 0.1 - 1.0 K/uL   Eosinophils Relative 3 %   Eosinophils Absolute 0.3 0.0 - 0.5 K/uL   Basophils Relative 0 %   Basophils Absolute 0.0 0.0 - 0.1 K/uL   Immature Granulocytes 0 %   Abs Immature Granulocytes 0.03  0.00 - 0.07 K/uL    Comment: Performed at [facility], [place] [address], [place], [place] [address]  Comprehensive metabolic panel     Status: Abnormal   Collection Time: 05/16/22  1:16 PM  Result Value Ref Range   Sodium 137 135 - 145 mmol/L   Potassium 3.7 3.5 - 5.1 mmol/L   Chloride 106 98 - 111 mmol/L   CO2 21 (L) 22 - 32 mmol/L   Glucose, Bld 93 70 - 99 mg/dL    Comment: Glucose reference range applies only to samples taken after  fasting for at least 8 hours.   BUN <5 (L) 6 - 20 mg/dL   Creatinine, Ser 0.38 (L) 0.44 - 1.00 mg/dL   Calcium 8.8 (L) 8.9 - 10.3 mg/dL   Total Protein 7.0 6.5 - 8.1 g/dL   Albumin 3.1 (L) 3.5 - 5.0 g/dL   AST 12 (L) 15 - 41 U/L   ALT 12 0 - 44 U/L   Alkaline Phosphatase 93 38 - 126 U/L   Total Bilirubin 0.4 0.3 - 1.2 mg/dL   GFR, Estimated >60 >60 mL/min    Comment: (NOTE) Calculated using the CKD-EPI Creatinine Equation (2021)    Anion gap 10 5 - 15    Comment: Performed at [facility], [place] [address] [place], [place] [address]  Urinalysis, Routine w reflex microscopic Urine, Clean Catch     Status: Abnormal   Collection Time: 05/16/22  1:16 PM  Result Value Ref Range   Color, Urine YELLOW YELLOW   APPearance CLEAR CLEAR   Specific Gravity, Urine <1.005 (L) 1.005 - 1.030   pH 6.0 5.0 - 8.0   Glucose, UA NEGATIVE NEGATIVE mg/dL   Hgb urine dipstick NEGATIVE NEGATIVE   Bilirubin Urine NEGATIVE NEGATIVE   Ketones, ur NEGATIVE NEGATIVE mg/dL   Protein, ur NEGATIVE NEGATIVE mg/dL   Nitrite NEGATIVE NEGATIVE   Leukocytes,Ua NEGATIVE NEGATIVE    Comment: Microscopic not done on urines with negative protein, blood, leukocytes, nitrite, or glucose < 500 mg/dL. Performed at [facility], [place] [address] [place], [place] [address]   Lactic acid, plasma     Status: None   Collection Time: 05/16/22  1:39 PM  Result Value Ref Range   Lactic Acid, Venous 1.8 0.5 - 1.9 mmol/L    Comment: Performed at [facility] [address], [place],  [address]    No results found.   A/P: [name] is an 56 y.o. female with hx RYGB, exlap/SBR 10/28, takeback with retention closure 04/24/22 now with question of wound infection  -Recommend CT A/P, pending -We will follow with you; additional plans based on [place], MD [facility], [place]

## 2022-05-16 NOTE — ED Provider Triage Note (Signed)
Emergency Medicine Provider Triage Evaluation Note  [name] , a 56 y.o. female  was evaluated in triage.  Pt complains of worsening abdominal pain.  Patient had small bowel surgery in October for SBO and small bowel stricture.  Patient has had increasing drainage from the sutures, redness noted along the right side of the wound.  Denies fever.  She is passing gas.  No vomiting.  Review of Systems  Positive: Abdominal pain Negative: Fever  Physical Exam  BP 134/89 (BP Location: Right Arm)   Pulse (!) 108   Temp 98 F (36.7 C)   Resp 17   Ht '5\' 2"'$  (1.575 m)   Wt 43.1 kg   LMP 01/08/2011   SpO2 95%   BMI 17.38 kg/m  Gen:   Awake, appears somewhat uncomfortable Resp:  Normal effort  MSK:   Moves extremities without difficulty  Other:  There is mild amount of purulent drainage around the wound with retention sutures in place, no obvious dehiscence.  Along the right-hand side of the wound margin, there is erythema.  Patient with moderate discomfort to palpation.  Medical Decision Making  Medically screening exam initiated at 1:16 PM.  Appropriate orders placed.  [name] was informed that the remainder of the evaluation will be completed by another provider, this initial triage assessment does not replace that evaluation, and the importance of remaining in the ED until their evaluation is complete.     [name] Cater, PA-C 05/16/22 1318

## 2022-05-16 NOTE — ED Provider Notes (Signed)
Signout from Dr. Vanita Panda.  Patient had extensive surgery earlier this month now with increased pain and discharge through abdominal wound.  Plan is to follow-up on surgery recommendations and CT.  Antibiotics have been ordered. Physical Exam  BP (!) 154/90   Pulse (!) 112   Temp 98.3 F (36.8 C) (Oral)   Resp (!) 21   Ht '5\' 2"'$  (1.575 m)   Wt 43.1 kg   LMP 01/08/2011   SpO2 99%   BMI 17.38 kg/m   Physical Exam  Procedures  Procedures  ED Course / MDM    Medical Decision Making Risk Prescription drug management.   Informed by nurse that patient wishes to be discharged.  She had some itchiness from the [place] infusion and that was stopped.  Patient's daughter is here now trying to convince her to stay.  Patient is willing to get the CT done.  We will reconnect with surgery once CT done.   CT unable to use IV.  Patient refusing placement of another IV and asking to be discharged.  I updated Dr. Dema Severin general surgery.  Will provide prescription for antibiotics and recommended return to the emergency department or follow-up in the general surgery clinic as scheduled.  Patient understands that her condition may worsen and even lead to her death.  Both myself and her daughter are unable to convince her to stay in the department for further work-up.       Hayden Rasmussen, MD 05/17/22 [phone]

## 2022-05-16 NOTE — Discharge Instructions (Signed)
You are seen in the emergency department for abdominal pain and possible infection.  You are signing out [place].  General surgery is recommending he return to the emergency department or follow-up in the clinic.  We are prescribing some antibiotics.  Please return if you change your mind.

## 2022-05-16 NOTE — ED Notes (Signed)
Patient refusing IV antibiotics or any other form of IV care. MD notified

## 2022-05-16 NOTE — ED Provider Notes (Signed)
[place] EMERGENCY DEPARTMENT Provider Note   CSN: [id number] Arrival date & time: 05/16/22  1305     History  Chief Complaint  Patient presents with   Post-op Problem    [name] is a 56 y.o. female.  HPI Patient presents with concern of wound pain, and purulent discharge.  Patient's history is extensive, but notably she had admission, with surgical revision earlier this month, was discharged, doing generally well until about 2 days ago.  According to home nurse 2 days ago the wound appeared normal, but over the past 2 days patient has developed purulent discharge with increasing pain throughout the abdomen.  No report of fever, vomiting.    Home Medications Prior to Admission medications   Medication Sig Start Date End Date Taking? Authorizing Provider  acetaminophen (TYLENOL) 500 MG tablet Take 2 tablets (1,000 mg total) by mouth every 6 (six) hours as needed for mild pain. 04/29/22   Geradine Girt, DO  albuterol (PROAIR HFA) 108 (90 Base) MCG/ACT inhaler Inhale 2 puffs into the lungs every 6 (six) hours as needed for wheezing or shortness of breath. 11/16/17   Lindell Spar I, NP  bisacodyl (DULCOLAX) 10 MG suppository Place 1 suppository (10 mg total) rectally daily as needed for moderate constipation. 04/29/22   Geradine Girt, DO  docusate sodium (COLACE) 100 MG capsule Take 1 capsule (100 mg total) by mouth 2 (two) times daily. 04/29/22   Geradine Girt, DO  folic acid (FOLVITE) 1 MG tablet Take 1 tablet (1 mg total) by mouth daily. Patient not taking: Reported on 04/05/2022 02/12/22   Elodia Florence., MD  ipratropium-albuterol (DUONEB) 0.5-2.5 (3) MG/3ML SOLN Take 3 mLs by nebulization every 6 (six) hours as needed. Patient not taking: Reported on 04/05/2022 02/12/22   Elodia Florence., MD  methocarbamol (ROBAXIN) 750 MG tablet Take 1 tablet (750 mg total) by mouth every 6 (six) hours as needed for muscle spasms. 04/29/22   Geradine Girt, DO  naloxone [facility]) nasal spray 4 mg/0.1 mL Place 1 spray into the nose once. 04/29/22   [provider]  nicotine (NICODERM CQ - DOSED IN MG/24 HOURS) 21 mg/24hr patch Place 1 patch (21 mg total) onto the skin daily. 04/30/22   Geradine Girt, DO  ondansetron (ZOFRAN) 4 MG tablet Take 1 tablet (4 mg total) by mouth every 6 (six) hours as needed for nausea. 04/29/22   Geradine Girt, DO  traMADol (ULTRAM) 50 MG tablet Take 1 tablet (50 mg total) by mouth every 12 (twelve) hours as needed for severe pain (pain not relieved by oxycodone). 05/06/22   Nita Sells, MD      Allergies    Iron dextran and Aspirin    Review of Systems   Review of Systems  All other systems reviewed and are negative.   Physical Exam Updated Vital Signs BP 131/86   Pulse 98   Temp 98.3 F (36.8 C) (Oral)   Resp 19   Ht '5\' 2"'$  (1.575 m)   Wt 43.1 kg   LMP 01/08/2011   SpO2 99%   BMI 17.38 kg/m  Physical Exam Vitals and nursing note reviewed.  Constitutional:      Appearance: She is well-developed. She is ill-appearing.     Comments: Deconditioned thin adult female who appears older than stated age.  HENT:     Head: Normocephalic and atraumatic.  Eyes:     Conjunctiva/sclera: Conjunctivae normal.  Cardiovascular:     Rate and Rhythm: Regular rhythm. Tachycardia present.  Pulmonary:     Effort: Pulmonary effort is normal. No respiratory distress.     Breath sounds: Normal breath sounds. No stridor.  Abdominal:     General: There is no distension.     Tenderness: There is guarding.    Skin:    General: Skin is warm and dry.  Neurological:     Mental Status: She is alert and oriented to person, place, and time.     Cranial Nerves: No cranial nerve deficit.  Psychiatric:        Mood and Affect: Mood normal.     ED Results / Procedures / Treatments   Labs (all labs ordered are listed, but only abnormal results are displayed) Labs Reviewed  CBC WITH DIFFERENTIAL/PLATELET  - Abnormal; Notable for the following components:      Result Value   WBC 11.1 (*)    RDW 15.6 (*)    Neutro Abs 7.9 (*)    All other components within normal limits  COMPREHENSIVE METABOLIC PANEL - Abnormal; Notable for the following components:   CO2 21 (*)    BUN <5 (*)    Creatinine, Ser 0.38 (*)    Calcium 8.8 (*)    Albumin 3.1 (*)    AST 12 (*)    All other components within normal limits  URINALYSIS, ROUTINE W REFLEX MICROSCOPIC - Abnormal; Notable for the following components:   Specific Gravity, Urine <1.005 (*)    All other components within normal limits  LACTIC ACID, PLASMA  LACTIC ACID, PLASMA    EKG None  Radiology No results found.  Procedures Procedures    Medications Ordered in ED Medications  vancomycin (VANCOCIN) IVPB 1000 mg/200 mL premix (has no administration in time range)  piperacillin-tazobactam (ZOSYN) IVPB 3.375 g (has no administration in time range)  piperacillin-tazobactam (ZOSYN) IVPB 3.375 g (has no administration in time range)  morphine (PF) 4 MG/ML injection 4 mg (has no administration in time range)    ED Course/ Medical Decision Making/ A&P  This patient with a Hx of [facility] medical issues, including recent lengthy hospitalization with multiple abdominal surgeries now presents with abdominal pain, purulence from surgical site, this involves an extensive number of treatment options, and is a complaint that carries with it a high risk of complications and morbidity.    The differential diagnosis includes bacteremia, sepsis, surgical site infection, intra-abdominal abscess   Social Determinants of Health:  Polysubstance abuse, schizophrenia  Additional history obtained:  Additional history and/or information obtained from chart review, notable for surgical notes, hospitalization notes from earlier this month   After the initial evaluation, orders, including: CT labs empiric antibiotics IV narcotics were initiated.   Patient  placed on Cardiac and Pulse-Oximetry Monitors. The patient was maintained on a cardiac monitor.  The cardiac monitored showed an rhythm of 95 sinus normal The patient was also maintained on pulse oximetry. The readings were typically 100% room air normal   On repeat evaluation of the patient stayed the same  Lab Tests:  I personally interpreted labs.  The pertinent results include: Leukocytosis   Consultations Obtained:  I requested consultation with the Dr. Dema Severin, general surgery,  and discussed lab and imaging findings as well as pertinent plan - they recommend: Admission, antibiotics, CT scan  Dispostion / Final MDM:  After consideration of the diagnostic results and the patient's response to treatment, patient is awaiting CT imaging, antibiotics were started,  she has received IV narcotics and fluid resuscitation.  With concern for infection, either superficial or deep with recent surgical history patient will require admission for further monitoring, management.  On signout the patient is awaiting CT imaging.    Final Clinical Impression(s) / ED Diagnoses Final diagnoses:  Infection  Postoperative pain     Carmin Muskrat, MD 05/16/22 1559

## 2022-05-16 NOTE — Progress Notes (Signed)
Pharmacy Antibiotic Note  [name] is a 56 y.o. female admitted on 05/16/2022 with  intra-abdominal infxn .  Pharmacy has been consulted for Zosyn dosing.WBC 11.1/LA 1.8.  Patient recently was admitted with SBO s/p laparotomy w/ lysis of adhesions on 04/18/22 and exlap with abdominal closure with retention sutures on 04/24/22. Patient has continued abdominal pain with red and green drainage from incision site.   Plan: Zosyn 3.375 g IV q8h  F/u renal func, culture data, imaging, clinical picture  Height: '5\' 2"'$  (157.5 cm) Weight: 43.1 kg (95 lb) IBW/kg (Calculated) : 50.1  Temp (24hrs), Avg:98.2 F (36.8 C), Min:98 F (36.7 C), Max:98.3 F (36.8 C)  Recent Labs  Lab 05/16/22 1316 05/16/22 1339  WBC 11.1*  --   CREATININE 0.38*  --   LATICACIDVEN  --  1.8    Estimated Creatinine Clearance: 52.8 mL/min (A) (by C-G formula based on SCr of 0.38 mg/dL (L)).    Allergies  Allergen Reactions   Iron Dextran Shortness Of Breath and Anxiety   Aspirin Nausea And Vomiting and Other (See Comments)    Ok to take tylenol or ibuprofen     Antimicrobials this admission: Zosyn 11/24>  Dose adjustments this admission:   Microbiology results:   Thank you for allowing pharmacy to be a part of this patient's care.   Wilson Singer, PharmD Clinical Pharmacist 05/16/2022 3:16 PM

## 2022-05-18 ENCOUNTER — Observation Stay (HOSPITAL_COMMUNITY)
Admission: EM | Admit: 2022-05-18 | Discharge: 2022-05-19 | Disposition: A | Discharge status: Home / Self Care | Payer: Medicare Other | Attending: Family Medicine | Admitting: Family Medicine

## 2022-05-18 ENCOUNTER — Emergency Department (HOSPITAL_COMMUNITY): Payer: Medicare Other

## 2022-05-18 ENCOUNTER — Encounter (HOSPITAL_COMMUNITY): Payer: Self-pay

## 2022-05-18 ENCOUNTER — Other Ambulatory Visit: Payer: Self-pay

## 2022-05-18 DIAGNOSIS — E119 Type 2 diabetes mellitus without complications: Secondary | ICD-10-CM

## 2022-05-18 DIAGNOSIS — Z79899 Other long term (current) drug therapy: Secondary | ICD-10-CM | POA: Diagnosis not present

## 2022-05-18 DIAGNOSIS — I4891 Unspecified atrial fibrillation: Secondary | ICD-10-CM | POA: Insufficient documentation

## 2022-05-18 DIAGNOSIS — F250 Schizoaffective disorder, bipolar type: Secondary | ICD-10-CM | POA: Diagnosis present

## 2022-05-18 DIAGNOSIS — I10 Essential (primary) hypertension: Secondary | ICD-10-CM | POA: Insufficient documentation

## 2022-05-18 DIAGNOSIS — J449 Chronic obstructive pulmonary disease, unspecified: Secondary | ICD-10-CM | POA: Diagnosis not present

## 2022-05-18 DIAGNOSIS — Y838 Other surgical procedures as the cause of abnormal reaction of the patient, or of later complication, without mention of misadventure at the time of the procedure: Secondary | ICD-10-CM | POA: Diagnosis not present

## 2022-05-18 DIAGNOSIS — E039 Hypothyroidism, unspecified: Secondary | ICD-10-CM | POA: Insufficient documentation

## 2022-05-18 DIAGNOSIS — R109 Unspecified abdominal pain: Secondary | ICD-10-CM | POA: Diagnosis not present

## 2022-05-18 DIAGNOSIS — E1140 Type 2 diabetes mellitus with diabetic neuropathy, unspecified: Secondary | ICD-10-CM | POA: Insufficient documentation

## 2022-05-18 DIAGNOSIS — F259 Schizoaffective disorder, unspecified: Secondary | ICD-10-CM | POA: Diagnosis present

## 2022-05-18 DIAGNOSIS — T8149XA Infection following a procedure, other surgical site, initial encounter: Secondary | ICD-10-CM | POA: Diagnosis not present

## 2022-05-18 DIAGNOSIS — I2699 Other pulmonary embolism without acute cor pulmonale: Secondary | ICD-10-CM | POA: Diagnosis present

## 2022-05-18 DIAGNOSIS — F17210 Nicotine dependence, cigarettes, uncomplicated: Secondary | ICD-10-CM | POA: Insufficient documentation

## 2022-05-18 DIAGNOSIS — J441 Chronic obstructive pulmonary disease with (acute) exacerbation: Secondary | ICD-10-CM | POA: Diagnosis present

## 2022-05-18 DIAGNOSIS — L089 Local infection of the skin and subcutaneous tissue, unspecified: Secondary | ICD-10-CM | POA: Diagnosis not present

## 2022-05-18 DIAGNOSIS — I700 Atherosclerosis of aorta: Secondary | ICD-10-CM | POA: Diagnosis not present

## 2022-05-18 LAB — URINALYSIS, ROUTINE W REFLEX MICROSCOPIC
Bilirubin Urine: NEGATIVE
Glucose, UA: NEGATIVE mg/dL
Hgb urine dipstick: NEGATIVE
Ketones, ur: NEGATIVE mg/dL
Leukocytes,Ua: NEGATIVE
Nitrite: NEGATIVE
Protein, ur: NEGATIVE mg/dL
Specific Gravity, Urine: 1.002 — ABNORMAL LOW (ref 1.005–1.030)
pH: 5 (ref 5.0–8.0)

## 2022-05-18 LAB — COMPREHENSIVE METABOLIC PANEL
ALT: 10 U/L (ref 0–44)
AST: 14 U/L — ABNORMAL LOW (ref 15–41)
Albumin: 3.4 g/dL — ABNORMAL LOW (ref 3.5–5.0)
Alkaline Phosphatase: 89 U/L (ref 38–126)
Anion gap: 14 (ref 5–15)
BUN: <5 mg/dL — ABNORMAL LOW (ref 6–20)
CO2: 19 mmol/L — ABNORMAL LOW (ref 22–32)
Calcium: 9 mg/dL (ref 8.9–10.3)
Chloride: 106 mmol/L (ref 98–111)
Creatinine, Ser: <0.3 mg/dL — ABNORMAL LOW (ref 0.44–1.00)
Glucose, Bld: 105 mg/dL — ABNORMAL HIGH (ref 70–99)
Potassium: 3.8 mmol/L (ref 3.5–5.1)
Sodium: 139 mmol/L (ref 135–145)
Total Bilirubin: 0.5 mg/dL (ref 0.3–1.2)
Total Protein: 7.5 g/dL (ref 6.5–8.1)

## 2022-05-18 LAB — CBC WITH DIFFERENTIAL/PLATELET
Abs Immature Granulocytes: 0.04 10*3/uL (ref 0.00–0.07)
Basophils Absolute: 0.1 10*3/uL (ref 0.0–0.1)
Basophils Relative: 1 %
Eosinophils Absolute: 0.4 10*3/uL (ref 0.0–0.5)
Eosinophils Relative: 3 %
HCT: 43.4 % (ref 36.0–46.0)
Hemoglobin: 13.5 g/dL (ref 12.0–15.0)
Immature Granulocytes: 0 %
Lymphocytes Relative: 29 %
Lymphs Abs: 3.1 10*3/uL (ref 0.7–4.0)
MCH: 28.8 pg (ref 26.0–34.0)
MCHC: 31.1 g/dL (ref 30.0–36.0)
MCV: 92.7 fL (ref 80.0–100.0)
Monocytes Absolute: 0.4 10*3/uL (ref 0.1–1.0)
Monocytes Relative: 4 %
Neutro Abs: 6.8 10*3/uL (ref 1.7–7.7)
Neutrophils Relative %: 63 %
Platelets: 331 10*3/uL (ref 150–400)
RBC: 4.68 MIL/uL (ref 3.87–5.11)
RDW: 15.9 % — ABNORMAL HIGH (ref 11.5–15.5)
WBC: 10.7 10*3/uL — ABNORMAL HIGH (ref 4.0–10.5)
nRBC: 0 % (ref 0.0–0.2)

## 2022-05-18 LAB — LACTIC ACID, PLASMA
Lactic Acid, Venous: 2.3 mmol/L (ref 0.5–1.9)
Lactic Acid, Venous: 1.8 mmol/L (ref 0.5–1.9)

## 2022-05-18 LAB — LIPASE, BLOOD: Lipase: 40 U/L (ref 11–51)

## 2022-05-18 MED ORDER — MORPHINE SULFATE (PF) 2 MG/ML IV SOLN
1.0000 mg | INTRAVENOUS | Status: DC | PRN
  Administered 2022-05-19: 1 mg via INTRAVENOUS
  Filled 2022-05-18: qty 1

## 2022-05-18 MED ORDER — SODIUM CHLORIDE 0.9 % IV BOLUS
30.0000 mL/kg | Freq: Once | INTRAVENOUS | Status: AC
  Administered 2022-05-18: 1293 mL via INTRAVENOUS

## 2022-05-18 MED ORDER — VANCOMYCIN HCL IN DEXTROSE 1-5 GM/200ML-% IV SOLN
1000.0000 mg | Freq: Once | INTRAVENOUS | Status: AC
  Administered 2022-05-18: 1000 mg via INTRAVENOUS
  Filled 2022-05-18: qty 200

## 2022-05-18 MED ORDER — ACETAMINOPHEN 325 MG PO TABS
650.0000 mg | ORAL_TABLET | Freq: Four times a day (QID) | ORAL | Status: DC | PRN
  Filled 2022-05-18: qty 2

## 2022-05-18 MED ORDER — MORPHINE SULFATE (PF) 4 MG/ML IV SOLN
4.0000 mg | Freq: Once | INTRAVENOUS | Status: AC
  Administered 2022-05-18: 4 mg via INTRAVENOUS
  Filled 2022-05-18: qty 1

## 2022-05-18 MED ORDER — ONDANSETRON HCL 4 MG PO TABS: 4.0000 mg | ORAL_TABLET | Freq: Four times a day (QID) | ORAL | Status: DC | PRN

## 2022-05-18 MED ORDER — ACETAMINOPHEN 650 MG RE SUPP: 650.0000 mg | Freq: Four times a day (QID) | RECTAL | Status: DC | PRN

## 2022-05-18 MED ORDER — PIPERACILLIN-TAZOBACTAM 3.375 G IVPB 30 MIN
3.3750 g | Freq: Once | INTRAVENOUS | Status: AC
  Administered 2022-05-18: 3.375 g via INTRAVENOUS
  Filled 2022-05-18: qty 50

## 2022-05-18 MED ORDER — INSULIN ASPART 100 UNIT/ML IJ SOLN
0.0000 [IU] | Freq: Three times a day (TID) | INTRAMUSCULAR | Status: DC
  Administered 2022-05-19: 1 [IU] via SUBCUTANEOUS

## 2022-05-18 MED ORDER — ONDANSETRON HCL 4 MG/2ML IJ SOLN: 4.0000 mg | Freq: Four times a day (QID) | INTRAMUSCULAR | Status: DC | PRN

## 2022-05-18 MED ORDER — DIPHENHYDRAMINE HCL 50 MG/ML IJ SOLN: 25.0000 mg | Freq: Four times a day (QID) | INTRAMUSCULAR | Status: DC | PRN

## 2022-05-18 MED ORDER — LORAZEPAM 0.5 MG PO TABS: 0.5000 mg | ORAL_TABLET | Freq: Two times a day (BID) | ORAL | Status: DC | PRN

## 2022-05-18 MED ORDER — SENNOSIDES-DOCUSATE SODIUM 8.6-50 MG PO TABS: 1.0000 | ORAL_TABLET | Freq: Every evening | ORAL | Status: DC | PRN

## 2022-05-18 MED ORDER — HYDROCODONE-ACETAMINOPHEN 5-325 MG PO TABS
1.0000 | ORAL_TABLET | ORAL | Status: DC | PRN
  Administered 2022-05-18: 2 via ORAL
  Administered 2022-05-19: 1 via ORAL
  Filled 2022-05-18: qty 1
  Filled 2022-05-18: qty 2

## 2022-05-18 MED ORDER — LACTATED RINGERS IV BOLUS
1000.0000 mL | Freq: Once | INTRAVENOUS | Status: AC
  Administered 2022-05-18: 1000 mL via INTRAVENOUS

## 2022-05-18 MED ORDER — IOHEXOL 350 MG/ML SOLN
75.0000 mL | Freq: Once | INTRAVENOUS | Status: AC | PRN
  Administered 2022-05-18: 75 mL via INTRAVENOUS

## 2022-05-18 MED ORDER — ALBUTEROL SULFATE (2.5 MG/3ML) 0.083% IN NEBU: 2.5000 mg | INHALATION_SOLUTION | Freq: Four times a day (QID) | RESPIRATORY_TRACT | Status: DC | PRN

## 2022-05-18 MED ORDER — ONDANSETRON HCL 4 MG/2ML IJ SOLN
4.0000 mg | Freq: Once | INTRAMUSCULAR | Status: AC
  Administered 2022-05-18: 4 mg via INTRAVENOUS
  Filled 2022-05-18: qty 2

## 2022-05-18 MED ORDER — VANCOMYCIN HCL 750 MG/150ML IV SOLN
750.0000 mg | INTRAVENOUS | Status: DC
  Filled 2022-05-18: qty 150

## 2022-05-18 NOTE — Assessment & Plan Note (Signed)
Stable, last A1c 6.5% 04/05/2022.  Not on medical therapy as an outpatient. -[facility]

## 2022-05-18 NOTE — ED Provider Triage Note (Signed)
Emergency Medicine Provider Triage Evaluation Note  [name] , a 56 y.o. female  was evaluated in triage.  Pt complains of post surgical wound problem.  Status post small bowel resection on 10/27.  States that last couple days she has had more abdominal pain related to her surgical scar and more pustulant discharge from her surgical incision site.  Denies fever, nausea vomiting and diarrhea.  States she was seen for this complaint on 11/24 and disposition was to be admitted to the hospital for infection related to her surgical site but patient left AMA.   Review of Systems  Positive: See above Negative: See above  Physical Exam  BP 123/81 (BP Location: Right Arm)   Pulse (!) 108   Temp 98 F (36.7 C) (Oral)   Resp 18   LMP 01/08/2011   SpO2 94%  Gen:   Awake, no distress   Resp:  Normal effort  MSK:   Moves extremities without difficulty  Other:  Surgical sites red and tender.  Did not appreciate oozing or bleeding on my exam.  Medical Decision Making  Medically screening exam initiated at 1:54 PM.  Appropriate orders placed.  Cintya L Stoffer was informed that the remainder of the evaluation will be completed by another provider, this initial triage assessment does not replace that evaluation, and the importance of remaining in the ED until their evaluation is complete.  Work up initiated   Harriet Pho, PA-C 05/18/22 1358

## 2022-05-18 NOTE — ED Notes (Signed)
Patient transported to CT 

## 2022-05-18 NOTE — ED Triage Notes (Signed)
Patient concerned that abdominal wound infected post surgery for bowel obstruction. Reports increased pain. Complains of chills. Alert and oriented. Redness noted around incision

## 2022-05-18 NOTE — ED Provider Notes (Signed)
[facility] EMERGENCY DEPARTMENT Provider Note  CSN: [id number] Arrival date & time: 05/18/22 1252  Chief Complaint(s) Wound Infection  HPI [name] is a 56 y.o. female with history of COPD, diabetes, hypertension, hyperlipidemia, schizophrenia, previous Roux-en-Y, recent ex lap for lysis of adhesions, complicated by wound infection.  Patient reports increasing redness over the past few days, also drainage of some purulent material.  She was in the emergency department 2 days ago for similar symptoms.  At that time CT scan was recommended but patient ended up leaving AMA and could not be convinced to stay.  She reports her symptoms have worsened so she decided to come back to the emergency department today.  She reports that today she noticed some pus draining from the wound as well as increased redness and pain.  She denies nausea, vomiting, fevers, chills, diarrhea.  Reports normal bowel movements.   Past Medical History Past Medical History:  Diagnosis Date   Abdominal pain    Accidental drug overdose April 2013   Anxiety    Atrial fibrillation ([place]) 09/29/11   converted spontaneously   Chronic back pain    Chronic knee pain    Chronic nausea    Chronic pain    COPD (chronic obstructive pulmonary disease) ([place])    Depression    Diabetes mellitus    states her doctor took her off all DM meds in past month   Diabetic neuropathy ([place])    Dyspnea    with exertion    GERD (gastroesophageal reflux disease)    Headache(784.0)    migraines    HTN (hypertension)    not on meds since in a year    Hyperlipidemia    Hypothyroidism    not on meds in a while    Mental disorder    Bipolar and schizophrenic   Requires supplemental oxygen    as needed per patient    Schizophrenia ([place])    Schizophrenia, acute ([place]) 11/13/2017   Tobacco abuse    Patient Active Problem List   Diagnosis Date Noted   Postoperative wound infection 05/04/2022   Sepsis ([place])  05/04/2022   Enterocolitis 05/04/2022   Thrombocytosis 05/04/2022   Hypoxia 05/04/2022   Seizure ([place]) 04/17/2022   Memory loss 04/17/2022   Hypomagnesemia 04/08/2022   Small bowel obstruction ([place]) 04/07/2022   SOB (shortness of breath) 04/05/2022   Abnormal EEG 02/12/2022   Constipation 02/12/2022   Acute urinary retention 02/12/2022   Vitamin A deficiency 02/12/2022   Vitamin D deficiency 02/12/2022   Vitamin C deficiency 02/12/2022   Iron deficiency anemia 02/12/2022   GERD (gastroesophageal reflux disease) 02/12/2022   Tobacco abuse 02/12/2022   Ileus ([place])    Protein-calorie malnutrition, severe 01/14/2022   Acute respiratory failure with hypoxia ([place]) 11/14/2021   Anemia 11/14/2021   Polysubstance (excluding opioids) dependence, daily use ([place]) 08/04/2018   Substance induced mood disorder ([place]) 08/03/2018   Schizoaffective disorder, bipolar type ([place]) 11/13/2017   Polysubstance abuse ([place]) 11/13/2017   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 Authorizing Provider  acetaminophen (TYLENOL) 500 MG tablet Take 2 tablets (1,000 mg total) by mouth every 6 (six) hours as needed for mild pain. 04/29/22   Eliseo Squires,  Jessica U, DO  albuterol (PROAIR HFA) 108 (90 Base) MCG/ACT inhaler Inhale 2 puffs into the lungs every 6 (six) hours as needed for wheezing or shortness of breath. 11/16/17   Lindell Spar I, NP  bisacodyl (DULCOLAX) 10 MG suppository Place 1 suppository (10 mg total) rectally daily as needed for moderate constipation. 04/29/22   Geradine Girt, DO  ciprofloxacin (CIPRO) 500 MG tablet Take 1 tablet (500 mg total) by mouth 2 (two) times daily. 05/16/22   Hayden Rasmussen, MD   docusate sodium (COLACE) 100 MG capsule Take 1 capsule (100 mg total) by mouth 2 (two) times daily. 04/29/22   Geradine Girt, DO  folic acid (FOLVITE) 1 MG tablet Take 1 tablet (1 mg total) by mouth daily. Patient not taking: Reported on 04/05/2022 02/12/22   Elodia Florence., MD  ipratropium-albuterol (DUONEB) 0.5-2.5 (3) MG/3ML SOLN Take 3 mLs by nebulization every 6 (six) hours as needed. Patient not taking: Reported on 04/05/2022 02/12/22   Elodia Florence., MD  methocarbamol (ROBAXIN) 750 MG tablet Take 1 tablet (750 mg total) by mouth every 6 (six) hours as needed for muscle spasms. 04/29/22   Geradine Girt, DO  metroNIDAZOLE (FLAGYL) 500 MG tablet Take 1 tablet (500 mg total) by mouth 2 (two) times daily. 05/16/22   Hayden Rasmussen, MD  naloxone [facility]) nasal spray 4 mg/0.1 mL Place 1 spray into the nose once. 04/29/22   [provider]  nicotine (NICODERM CQ - DOSED IN MG/24 HOURS) 21 mg/24hr patch Place 1 patch (21 mg total) onto the skin daily. 04/30/22   Geradine Girt, DO  ondansetron (ZOFRAN) 4 MG tablet Take 1 tablet (4 mg total) by mouth every 6 (six) hours as needed for nausea. 04/29/22   Geradine Girt, DO  traMADol (ULTRAM) 50 MG tablet Take 1 tablet (50 mg total) by mouth every 12 (twelve) hours as needed for severe pain (pain not relieved by oxycodone). 05/06/22   Nita Sells, MD                                                                                                                                    Past Surgical History Past Surgical History:  Procedure Laterality Date   ABDOMINAL HYSTERECTOMY     BLADDER SUSPENSION  03/04/2011   Procedure: [place];  Surgeon: Elayne Snare MacDiarmid;  Location: [facility];  Service: Urology;  Laterality: N/A;   BOWEL RESECTION N/A 04/18/2022   Procedure: SMALL BOWEL RESECTION;  Surgeon: Erroll Luna, MD;  Location: [facility];  Service: General;  Laterality: N/A;   CYSTOCELE REPAIR  03/04/2011    Procedure: ANTERIOR REPAIR (CYSTOCELE);  Surgeon: Reece Packer;  Location: [facility];  Service: Urology;  Laterality: N/A;   CYSTOSCOPY  03/04/2011   Procedure: CYSTOSCOPY;  Surgeon: Elayne Snare MacDiarmid;  Location: [facility];  Service: Urology;  Laterality: N/A;   ESOPHAGOGASTRODUODENOSCOPY (  EGD) WITH PROPOFOL N/A 05/12/2017   Procedure: ESOPHAGOGASTRODUODENOSCOPY (EGD) WITH PROPOFOL;  Surgeon: Alphonsa Overall, MD;  Location: [facility];  Service: General;  Laterality: N/A;   GASTRIC ROUX-EN-Y N/A 03/25/2016   Procedure: LAPAROSCOPIC ROUX-EN-Y GASTRIC BYPASS WITH UPPER ENDOSCOPY;  Surgeon: Excell Seltzer, MD;  Location: [facility];  Service: General;  Laterality: N/A;   KNEE SURGERY     LAPAROSCOPIC ASSISTED VAGINAL HYSTERECTOMY  03/04/2011   Procedure: LAPAROSCOPIC ASSISTED VAGINAL HYSTERECTOMY;  Surgeon: Cyril Mourning, MD;  Location: [facility];  Service: Gynecology;  Laterality: N/A;   LAPAROTOMY N/A 04/18/2022   Procedure: EXPLORATORY LAPAROTOMY;  Surgeon: Erroll Luna, MD;  Location: [facility];  Service: General;  Laterality: N/A;   LAPAROTOMY N/A 04/24/2022   Procedure: BRING BACK EXPLORATORY LAPAROTOMY;  Surgeon: Georganna Skeans, MD;  Location: [facility];  Service: General;  Laterality: N/A;   Family History Family History  Problem Relation Age of Onset   Heart attack Father        36s   Diabetes Mother    Heart disease Mother    Hypertension Mother    Heart attack Sister        23   COPD Other    Breast cancer Neg Hx     Social History Social History   Tobacco Use   Smoking status: Every Day    Packs/day: 0.50    Types: Cigarettes   Smokeless tobacco: Never  Vaping Use   Vaping Use: Never used  Substance Use Topics   Alcohol use: Yes    Comment: daily   Drug use: Not Currently    Types: Cocaine, Marijuana, "Crack" cocaine   Allergies Iron dextran and Aspirin  Review of Systems Review of Systems  All other systems reviewed and are negative.   Physical Exam Vital  Signs  I have reviewed the triage vital signs BP 137/77   Pulse (!) 103   Temp 98.2 F (36.8 C) (Oral)   Resp 17   LMP 01/08/2011   SpO2 93%  Physical Exam Vitals and nursing note reviewed.  Constitutional:      General: She is not in acute distress.    Appearance: She is well-developed.  HENT:     Head: Normocephalic and atraumatic.     Mouth/Throat:     Mouth: Mucous membranes are dry.  Eyes:     Pupils: Pupils are equal, round, and reactive to light.  Cardiovascular:     Rate and Rhythm: Normal rate and regular rhythm.     Heart sounds: No murmur heard. Pulmonary:     Effort: Pulmonary effort is normal. No respiratory distress.     Breath sounds: Normal breath sounds.  Abdominal:     General: Abdomen is flat.     Palpations: Abdomen is soft.     Comments: Large midline abdominal wound from prior surgery with retention sutures.  Some purulent drainage from the wound.  Tenderness around wound bed.  No crepitus  Musculoskeletal:        General: No tenderness.     Right lower leg: No edema.     Left lower leg: No edema.  Skin:    General: Skin is warm and dry.  Neurological:     General: No focal deficit present.     Mental Status: She is alert. Mental status is at baseline.  Psychiatric:        Mood and Affect: Mood normal.        Behavior: Behavior normal.  ED Results and Treatments Labs (all labs ordered are listed, but only abnormal results are displayed) Labs Reviewed  LACTIC ACID, PLASMA - Abnormal; Notable for the following components:      Result Value   Lactic Acid, Venous 2.3 (*)    All other components within normal limits  COMPREHENSIVE METABOLIC PANEL - Abnormal; Notable for the following components:   CO2 19 (*)    Glucose, Bld 105 (*)    BUN <5 (*)    Creatinine, Ser <0.30 (*)    Albumin 3.4 (*)    AST 14 (*)    All other components within normal limits  CBC WITH DIFFERENTIAL/PLATELET - Abnormal; Notable for the following components:    WBC 10.7 (*)    RDW 15.9 (*)    All other components within normal limits  URINALYSIS, ROUTINE W REFLEX MICROSCOPIC - Abnormal; Notable for the following components:   Color, Urine COLORLESS (*)    Specific Gravity, Urine 1.002 (*)    All other components within normal limits  LACTIC ACID, PLASMA  LIPASE, BLOOD  I-STAT BETA HCG BLOOD, ED ([facility], [facility], [facility] ONLY)                                                                                                                          Radiology CT Abdomen Pelvis W Contrast  Result Date: 05/18/2022 CLINICAL DATA:  Increased pain about the abdominal wound. Concern for infection post surgery. Redness noted around incision EXAM: CT ABDOMEN AND PELVIS WITH CONTRAST TECHNIQUE: Multidetector CT imaging of the abdomen and pelvis was performed using the standard protocol following bolus administration of intravenous contrast. RADIATION DOSE REDUCTION: This exam was performed according to the departmental dose-optimization program which includes automated exposure control, adjustment of the mA and/or kV according to patient size and/or use of iterative reconstruction technique. CONTRAST:  16m OMNIPAQUE IOHEXOL 350 MG/ML SOLN COMPARISON:  CT abdomen and pelvis 05/04/2022 FINDINGS: Lower chest: No acute abnormality. Hepatobiliary: No focal liver abnormality is seen. No gallstones, gallbladder wall thickening, or biliary dilatation. Pancreas: Unremarkable. Unchanged prominence of the main pancreatic duct. Spleen: Normal in size without focal abnormality. Adrenals/Urinary Tract: Normal adrenal glands. No urinary calculi or hydronephrosis. Unremarkable bladder. Stomach/Bowel: Postoperative change of small-bowel resection with anastomosis in the right hemipelvis. 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 The previous bowel wall thickening of the small bowel and duodenum has nearly resolved. Normal caliber large and small bowel. Vascular/Lymphatic: Aortic  atherosclerosis. No enlarged abdominal or pelvic lymph nodes. Reproductive: Status post hysterectomy. No adnexal masses. Other: No free intraperitoneal air. Musculoskeletal: Decreased soft tissue thickening and adjacent edema within the midline anterior subcutaneous fat along the incision. There is a new 2.7 x 1.4 cm fluid collection along the left paramidline midline incision (3/42). Decreased edema and fascial thickening about the rectus abdominis muscles compatible with improving postoperative change. No acute osseous abnormality.  Chronic T9 compression fracture. IMPRESSION: 1. There is a new 2.7 cm fluid collection  along the left aspect of the incision which could represent a developing abscess. Otherwise decreased inflammatory change about the anterior abdominal wall midline incision since 05/04/2022. 2. 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 The previous bowel wall thickening has nearly resolved. Electronically Signed   By: Placido Sou M.D.   On: 05/18/2022 17:55    Pertinent labs & imaging results that were available during my care of the patient were reviewed by me and considered in my medical decision making (see MDM for details).  Medications Ordered in ED Medications  vancomycin (VANCOCIN) IVPB 1000 mg/200 mL premix (1,000 mg Intravenous New Bag/Given 05/18/22 1758)  vancomycin (VANCOREADY) IVPB 750 mg/150 mL (has no administration in time range)  piperacillin-tazobactam (ZOSYN) IVPB 3.375 g (0 g Intravenous Stopped 05/18/22 1806)  sodium chloride 0.9 % bolus 1,293 mL (0 mLs Intravenous Stopped 05/18/22 1806)  morphine (PF) 4 MG/ML injection 4 mg (4 mg Intravenous Given 05/18/22 1644)  ondansetron (ZOFRAN) injection 4 mg (4 mg Intravenous Given 05/18/22 1647)  iohexol (OMNIPAQUE) 350 MG/ML injection 75 mL (75 mLs Intravenous Contrast Given 05/18/22 1717)  morphine (PF) 4  MG/ML injection 4 mg (4 mg Intravenous Given 05/18/22 1753)  lactated ringers bolus 1,000 mL (1,000 mLs Intravenous New Bag/Given 05/18/22 1804)                                                                                                                                     Procedures Procedures  (including critical care time)  Medical Decision Making / ED Course   MDM:  56 year old female presenting to the emergency department with abdominal wound from prior surgery.  Wound appears to have some infection given his erythema and warmth with some purulent drainage.  CT scan obtained which does show small fluid collection.  Discussed with Dr. Dema Severin of surgery who will come and evaluate and may attempt to perform bedside incision and drainage to express fluid collection.  He recommends admitting the patient for observation to medicine service for IV antibiotics and rehydration given her elevated lactate.  Although lactate is elevated suspect this is likely due to dehydration rather than sepsis as the patient reports that she has not eaten or drink anything today.  She has a mild leukocytosis but no fever.  Will page hospitalist for admission.  Clinical Course as of 05/18/22 1849  Nancy Fetter May 18, 2022  1846 Discussed with Dr. Dema Severin of general surgery.  He will come and evaluate the patient and likely try to express fluid collection at the bedside.  He does not think the patient will need surgery but recommends admission for hydration and IV antibiotics.  Discussed with Dr. Posey Pronto who accepted the patient for admission to the hospitalist service. [WS]    Clinical Course User Index [WS] Cristie Hem, MD     Additional history obtained:  -  External records from outside source obtained and reviewed including: Chart review including previous notes, labs, imaging, consultation notes including ED visit 2 days ago   Lab Tests: -I ordered, reviewed, and interpreted labs.   The pertinent  results include:   Labs Reviewed  LACTIC ACID, PLASMA - Abnormal; Notable for the following components:      Result Value   Lactic Acid, Venous 2.3 (*)    All other components within normal limits  COMPREHENSIVE METABOLIC PANEL - Abnormal; Notable for the following components:   CO2 19 (*)    Glucose, Bld 105 (*)    BUN <5 (*)    Creatinine, Ser <0.30 (*)    Albumin 3.4 (*)    AST 14 (*)    All other components within normal limits  CBC WITH DIFFERENTIAL/PLATELET - Abnormal; Notable for the following components:   WBC 10.7 (*)    RDW 15.9 (*)    All other components within normal limits  URINALYSIS, ROUTINE W REFLEX MICROSCOPIC - Abnormal; Notable for the following components:   Color, Urine COLORLESS (*)    Specific Gravity, Urine 1.002 (*)    All other components within normal limits  LACTIC ACID, PLASMA  LIPASE, BLOOD  I-STAT BETA HCG BLOOD, ED ([facility], [facility], [facility] ONLY)    Notable for mild leukocytosis, elevated lactate   Imaging Studies ordered: I ordered imaging studies including CT A/P On my interpretation imaging demonstrates 2-3 cm fluid collection I independently visualized and interpreted imaging. I agree with the radiologist interpretation   Medicines ordered and prescription drug management: Meds ordered this encounter  Medications   piperacillin-tazobactam (ZOSYN) IVPB 3.375 g    Order Specific Question:   Antibiotic Indication:    Answer:   Wound Infection   sodium chloride 0.9 % bolus 1,293 mL   morphine (PF) 4 MG/ML injection 4 mg   ondansetron (ZOFRAN) injection 4 mg   vancomycin (VANCOCIN) IVPB 1000 mg/200 mL premix    Order Specific Question:   Indication:    Answer:   Wound Infection   vancomycin (VANCOREADY) IVPB 750 mg/150 mL    Order Specific Question:   Indication:    Answer:   Wound Infection   iohexol (OMNIPAQUE) 350 MG/ML injection 75 mL   morphine (PF) 4 MG/ML injection 4 mg   lactated ringers bolus 1,000 mL    -I have reviewed the patients  home medicines and have made adjustments as needed   Consultations Obtained: I requested consultation with the general surgeon,  and discussed lab and imaging findings as well as pertinent plan - they recommend: admit, they will consult   Cardiac Monitoring: The patient was maintained on a cardiac monitor.  I personally viewed and interpreted the cardiac monitored which showed an underlying rhythm of: NSR  Social Determinants of Health:  Diagnosis or treatment significantly limited by social determinants of health: current smoker   Reevaluation: After the interventions noted above, I reevaluated the patient and found that they have improved  Co morbidities that complicate the patient evaluation  Past Medical History:  Diagnosis Date   Abdominal pain    Accidental drug overdose April 2013   Anxiety    Atrial fibrillation ([place]) 09/29/11   converted spontaneously   Chronic back pain    Chronic knee pain    Chronic nausea    Chronic pain    COPD (chronic obstructive pulmonary disease) ([place])    Depression    Diabetes mellitus    states her doctor took  her off all DM meds in past month   Diabetic neuropathy ([facility])    Dyspnea    with exertion    GERD (gastroesophageal reflux disease)    Headache(784.0)    migraines    HTN (hypertension)    not on meds since in a year    Hyperlipidemia    Hypothyroidism    not on meds in a while    Mental disorder    Bipolar and schizophrenic   Requires supplemental oxygen    as needed per patient    Schizophrenia ([place])    Schizophrenia, acute ([place]) 11/13/2017   Tobacco abuse       Dispostion: Disposition decision including need for hospitalization was considered, and patient admitted to the hospital.    Final Clinical Impression(s) / ED Diagnoses Final diagnoses:  Surgical wound infection     This chart was dictated using voice recognition software.  Despite best efforts to proofread,  errors can occur which can change the  documentation meaning.    Cristie Hem, MD 05/18/22 [phone]

## 2022-05-18 NOTE — ED Notes (Signed)
Lactic acid of 2.3. Minette Brine, PA informed via face to face conversation. New new orders received.

## 2022-05-18 NOTE — H&P (Signed)
History and Physical    [name] PTW:[id number] DOB: [birth year] DOA: 05/18/2022  PCP: [name], MD  Patient coming from: Home  I have personally briefly reviewed patient's old medical records in [place]  Chief Complaint: Abdominal surgical wound infection  HPI: [name] is a 56 y.o. female with medical history significant for SBO s/p ex lap and small bowel resection with lysis of adhesions 04/18/2022 followed by ex lap abdominal closure with retention sutures for wound dehiscence 70/0/1749 complicated by postoperative wound infection, COPD, T2DM, schizoaffective disorder, history of polysubstance use, previous suicide attempt 09/4965 complicated by aspiration pneumonia with respiratory failure requiring temporary tracheostomy placement who presented to the ED for evaluation of worsening abdominal pain and concern for wound site infection.  History is supplemented by patient's daughter by phone.  Patient last admitted 05/04/2022-05/06/2022 for wound infection/cellulitis not requiring operative management.  She was discharged on a course of Augmentin.  Returned to the ED on 11/24 with increased abdominal pain and discharged with abdominal wound.  She received vancomycin but developed pruritus.  Patient refused further evaluation and requested to be discharged.  She was given a prescription for Cipro and Flagyl.  Returns again with continued abdominal pain and area of erythema and swelling along the left aspect of her incision site.  ED Course  Labs/Imaging on admission: I have personally reviewed following labs and imaging studies.  Initial vitals showed BP 123/81, pulse 108, RR 18, temp 98.0 F, SpO2 94% on room air.  Labs show WBC 10.7, hemoglobin 13.5, platelets 331,000, sodium 139, potassium 3.8, bicarb 19, BUN <5, creatinine <0.30, serum glucose 105, lactic acid 2.3, lipase 40.  CT abdomen/pelvis with contrast shows a new 2.7 cm fluid collection along the  left aspect of the incision which could represent a developing abscess.  Otherwise decreased inflammatory change about the anterior abdominal wall midline incision compared to prior.  Postoperative changes of small bowel resection with anastomosis in the RLQ and more remote postop changes of Roux-en-Y gastric bypass noted.  Previous bowel wall thickening has nearly resolved.  Patient was given IV vancomycin and Zosyn, 1 L LR, 1293 mL NS bolus.  EDP spoke with on-call general surgery who will see in consultation and recommended medical admission for continued IV antibiotics.  The hospitalist service was consulted to admit for further evaluation and management.  Review of Systems: All systems reviewed and are negative except as documented in history of present illness above.   Past Medical History:  Diagnosis Date   Abdominal pain    Accidental drug overdose April 2013   Anxiety    Atrial fibrillation ([place]) 09/29/11   converted spontaneously   Chronic back pain    Chronic knee pain    Chronic nausea    Chronic pain    COPD (chronic obstructive pulmonary disease) ([place])    Depression    Diabetes mellitus    states her doctor took her off all DM meds in past month   Diabetic neuropathy ([place])    Dyspnea    with exertion    GERD (gastroesophageal reflux disease)    Headache(784.0)    migraines    HTN (hypertension)    not on meds since in a year    Hyperlipidemia    Hypothyroidism    not on meds in a while    Mental disorder    Bipolar and schizophrenic   Requires supplemental oxygen    as needed per patient    Schizophrenia (  [place])    Schizophrenia, acute ([place]) 11/13/2017   Tobacco abuse     Past Surgical History:  Procedure Laterality Date   ABDOMINAL HYSTERECTOMY     BLADDER SUSPENSION  03/04/2011   Procedure: [place];  Surgeon: Elayne Snare MacDiarmid;  Location: [facility];  Service: Urology;  Laterality: N/A;   BOWEL RESECTION N/A 04/18/2022   Procedure: SMALL BOWEL RESECTION;   Surgeon: Erroll Luna, MD;  Location: [facility];  Service: General;  Laterality: N/A;   CYSTOCELE REPAIR  03/04/2011   Procedure: ANTERIOR REPAIR (CYSTOCELE);  Surgeon: Reece Packer;  Location: [facility];  Service: Urology;  Laterality: N/A;   CYSTOSCOPY  03/04/2011   Procedure: CYSTOSCOPY;  Surgeon: Elayne Snare MacDiarmid;  Location: [facility];  Service: Urology;  Laterality: N/A;   ESOPHAGOGASTRODUODENOSCOPY (EGD) WITH PROPOFOL N/A 05/12/2017   Procedure: ESOPHAGOGASTRODUODENOSCOPY (EGD) WITH PROPOFOL;  Surgeon: Alphonsa Overall, MD;  Location: [facility];  Service: General;  Laterality: N/A;   GASTRIC ROUX-EN-Y N/A 03/25/2016   Procedure: LAPAROSCOPIC ROUX-EN-Y GASTRIC BYPASS WITH UPPER ENDOSCOPY;  Surgeon: Excell Seltzer, MD;  Location: [facility];  Service: General;  Laterality: N/A;   KNEE SURGERY     LAPAROSCOPIC ASSISTED VAGINAL HYSTERECTOMY  03/04/2011   Procedure: LAPAROSCOPIC ASSISTED VAGINAL HYSTERECTOMY;  Surgeon: Cyril Mourning, MD;  Location: [facility];  Service: Gynecology;  Laterality: N/A;   LAPAROTOMY N/A 04/18/2022   Procedure: EXPLORATORY LAPAROTOMY;  Surgeon: Erroll Luna, MD;  Location: [facility];  Service: General;  Laterality: N/A;   LAPAROTOMY N/A 04/24/2022   Procedure: BRING BACK EXPLORATORY LAPAROTOMY;  Surgeon: Georganna Skeans, MD;  Location: [facility];  Service: General;  Laterality: N/A;    Social History:  reports that she has been smoking cigarettes. She has been smoking an average of .5 packs per day. She has never used smokeless tobacco. She reports current alcohol use. She reports that she does not currently use drugs after having used the following drugs: Cocaine, Marijuana, and "Crack" cocaine.  Allergies  Allergen Reactions   Iron Dextran Shortness Of Breath and Anxiety   Aspirin Nausea And Vomiting and Other (See Comments)    Ok to take tylenol or ibuprofen     Family History  Problem Relation Age of Onset   Heart attack Father        49s   Diabetes Mother     Heart disease Mother    Hypertension Mother    Heart attack Sister        39   COPD Other    Breast cancer Neg Hx      Prior to Admission medications   Medication Sig Start Date End Date Taking? Authorizing Provider  acetaminophen (TYLENOL) 500 MG tablet Take 2 tablets (1,000 mg total) by mouth every 6 (six) hours as needed for mild pain. 04/29/22   Geradine Girt, DO  albuterol (PROAIR HFA) 108 (90 Base) MCG/ACT inhaler Inhale 2 puffs into the lungs every 6 (six) hours as needed for wheezing or shortness of breath. 11/16/17   Lindell Spar I, NP  bisacodyl (DULCOLAX) 10 MG suppository Place 1 suppository (10 mg total) rectally daily as needed for moderate constipation. 04/29/22   Geradine Girt, DO  ciprofloxacin (CIPRO) 500 MG tablet Take 1 tablet (500 mg total) by mouth 2 (two) times daily. 05/16/22   Hayden Rasmussen, MD  docusate sodium (COLACE) 100 MG capsule Take 1 capsule (100 mg total) by mouth 2 (two) times daily. 04/29/22   Geradine Girt, DO  folic acid (FOLVITE) 1 MG tablet Take 1 tablet (1 mg total) by mouth daily. Patient not taking: Reported on 04/05/2022 02/12/22   Elodia Florence., MD  ipratropium-albuterol (DUONEB) 0.5-2.5 (3) MG/3ML SOLN Take 3 mLs by nebulization every 6 (six) hours as needed. Patient not taking: Reported on 04/05/2022 02/12/22   Elodia Florence., MD  methocarbamol (ROBAXIN) 750 MG tablet Take 1 tablet (750 mg total) by mouth every 6 (six) hours as needed for muscle spasms. 04/29/22   Geradine Girt, DO  metroNIDAZOLE (FLAGYL) 500 MG tablet Take 1 tablet (500 mg total) by mouth 2 (two) times daily. 05/16/22   Hayden Rasmussen, MD  naloxone [facility]) nasal spray 4 mg/0.1 mL Place 1 spray into the nose once. 04/29/22   [provider]  nicotine (NICODERM CQ - DOSED IN MG/24 HOURS) 21 mg/24hr patch Place 1 patch (21 mg total) onto the skin daily. 04/30/22   Geradine Girt, DO  ondansetron (ZOFRAN) 4 MG tablet Take 1 tablet (4 mg total) by  mouth every 6 (six) hours as needed for nausea. 04/29/22   Geradine Girt, DO  traMADol (ULTRAM) 50 MG tablet Take 1 tablet (50 mg total) by mouth every 12 (twelve) hours as needed for severe pain (pain not relieved by oxycodone). 05/06/22   Nita Sells, MD    Physical Exam: Vitals:   05/18/22 1645 05/18/22 1700 05/18/22 1806 05/18/22 1810  BP: 127/83 135/76  137/77  Pulse: (!) 106 (!) 101  (!) 103  Resp: '20 18  17  '$ Temp:   98.2 F (36.8 C)   TempSrc:   Oral   SpO2: 94% 95%  93%   Constitutional: Resting in bed, scratching her arms and face Eyes: EOMI, lids and conjunctivae normal ENMT: Mucous membranes are moist. Posterior pharynx clear of any exudate or lesions.Normal dentition.  Neck: normal, supple, no masses. Respiratory: clear to auscultation bilaterally, no wheezing, no crackles. Normal respiratory effort. No accessory muscle use.  Cardiovascular: Regular rate and rhythm, no murmurs / rubs / gallops. No extremity edema. 2+ pedal pulses. Abdomen: Postoperative abdominal surgical wound with retention sutures, approximately 3 cm swollen erythematous area at the left aspect of the surgical wound site as pictured below.  Tender to palpation. Musculoskeletal: no clubbing / cyanosis. No joint deformity upper and lower extremities. Good ROM, no contractures. Normal muscle tone.  Skin: Abdominal surgical wound with retention sutures and area of erythema and swelling as pictured below Neurologic: Sensation intact. Strength 5/5 in all 4.  Psychiatric:  Alert and oriented x 3.  Anxious mood.   EKG: Not performed.  Assessment/Plan Principal Problem:   Postoperative wound infection Active Problems:   COPD (chronic obstructive pulmonary disease) (HCC)   Type 2 diabetes mellitus (HCC)   Schizoaffective disorder, bipolar type ([place])   [name] is a 56 y.o. female with medical history significant for SBO s/p ex lap and small bowel resection with lysis of adhesions 04/18/2022  followed by ex lap abdominal closure with retention sutures for wound dehiscence 43/06/5398 complicated by postoperative wound infection, COPD, T2DM, schizoaffective disorder, history of polysubstance use, previous suicide attempt 01/6760 complicated by aspiration pneumonia with respiratory failure requiring temporary tracheostomy placement who is admitted with concern for abdominal abscess at the site of her wound incision.  Assessment and Plan: * Postoperative wound infection Hx small bowel stricture s/p ex lap/small bowel resection/lysis of adhesions 04/18/2022 S/p follow-up ex lap abdominal closure with retention sutures for wound dehiscence 04/24/2022  Hx Roux-en-Y gastric bypass 2017 Presenting with concern for recurrent postoperative wound infection.  CT A/P shows new 2.7 cm fluid collection at the left aspect of incision site concerning for developing abscess. -General surgery to see in consultation, possible I&D -Continue IV vancomycin -Analgesics as needed  COPD (chronic obstructive pulmonary disease) (HCC) Stable.  Continue albuterol as needed.  Schizoaffective disorder, bipolar type [facility]) Not currently on any medications.  Previously on Abilify and as needed Ativan and Risperdal.  Would benefit from psychiatry follow-up.  Type 2 diabetes mellitus (HCC) Stable, last A1c 6.5% 04/05/2022.  Not on medical therapy as an outpatient. -SSI    DVT prophylaxis: SCDs Start: 05/18/22 1908 Code Status: Full code Family Communication: Discussed with patient's daughter, [name], by phone Disposition Plan: From home and likely discharge to home pending clinical progress Consults called: General surgery Severity of Illness: The appropriate patient status for this patient is OBSERVATION. Observation status is judged to be reasonable and necessary in order to provide the required intensity of service to ensure the patient's safety. The patient's presenting symptoms, physical exam findings,  and initial radiographic and laboratory data in the context of their medical condition is felt to place them at decreased risk for further clinical deterioration. Furthermore, it is anticipated that the patient will be medically stable for discharge from the hospital within 2 midnights of admission.   Zada Finders MD [facility]  If 7PM-7AM, please contact night-coverage [email or web address]  05/18/2022, 7:17 PM

## 2022-05-18 NOTE — Assessment & Plan Note (Signed)
Stable. Continue albuterol as needed.  

## 2022-05-18 NOTE — Hospital Course (Signed)
[name] is a 56 y.o. female with medical history significant for SBO s/p ex lap and small bowel resection with lysis of adhesions 04/18/2022 followed by ex lap abdominal closure with retention sutures for wound dehiscence 12/22/2195 complicated by postoperative wound infection, COPD, T2DM, schizoaffective disorder, history of polysubstance use, previous suicide attempt 10/8830 complicated by aspiration pneumonia with respiratory failure requiring temporary tracheostomy placement who is admitted with concern for abdominal abscess at the site of her wound incision.

## 2022-05-18 NOTE — Assessment & Plan Note (Addendum)
Hx small bowel stricture s/p ex lap/small bowel resection/lysis of adhesions 04/18/2022 S/p follow-up ex lap abdominal closure with retention sutures for wound dehiscence 04/24/2022 Hx Roux-en-Y gastric bypass 2017 Presenting with concern for recurrent postoperative wound infection.  CT A/P shows new 2.7 cm fluid collection at the left aspect of incision site concerning for developing abscess. -General surgery to see in consultation, possible I&D -Continue IV vancomycin -Analgesics as needed

## 2022-05-18 NOTE — Progress Notes (Signed)
Pharmacy Antibiotic Note  [name] is a 56 y.o. female admitted on 05/18/2022 with  wound infxn . Patient recently was admitted with SBO s/p laparotomy w/ lysis of adhesions on 04/18/22 and exlap with abdominal closure with retention sutures on 04/24/22.  Now with pain, redness around incision, and chills.  Of note, patient was in ED last on 11/24 but left d/t refusal of IV abx and medical care. Pharmacy has been consulted for vancomycin dosing.   WBC 10.7/LA 2.3/afeb here Patient weight = 43.1 kg   Plan: Vancomycin '1000mg'$  IV x1 as loading dose followed by vancomycin '750mg'$  IV q 24h  -goal AUC 400-550, predicted AUC 503 F/u renal function, LOT    Temp (24hrs), Avg:98 F (36.7 C), Min:98 F (36.7 C), Max:98 F (36.7 C)  Recent Labs  Lab 05/16/22 1316 05/16/22 1339 05/18/22 1349  WBC 11.1*  --  10.7*  CREATININE 0.38*  --  <0.30*  LATICACIDVEN  --  1.8 2.3*    CrCl cannot be calculated (This lab value cannot be used to calculate CrCl because it is not a number: <0.30).    Allergies  Allergen Reactions   Iron Dextran Shortness Of Breath and Anxiety   Aspirin Nausea And Vomiting and Other (See Comments)    Ok to take tylenol or ibuprofen     Antimicrobials this admission: Vancomycin 11/26>  Dose adjustments this admission:   Microbiology results:  11/12 Bcx No growth x5 days   Thank you for allowing pharmacy to be a part of this patient's care.  Wilson Singer, PharmD Clinical Pharmacist 05/18/2022 4:55 PM

## 2022-05-18 NOTE — Assessment & Plan Note (Signed)
Not currently on any medications.  Previously on Abilify and as needed Ativan and Risperdal.  Would benefit from psychiatry follow-up.

## 2022-05-18 NOTE — Consult Note (Signed)
CC/Reason for consult: Possible wound infection   Requesting physician: Dr. Garnette Gunner  HPI: [name] is an 56 y.o. female hx COPD, afib, DM, with complicated surgical history:   H/o gastric bypass 2017 Dr. Excell Seltzer, history of laparoscopic hysterectomy    04/18/22 - exploratory laparotomy/ small bowel resection/ lysis of adhesions - Dr. Brantley Stage -Intraop findings of Small bowel stricture   04/24/22 s/p ex lap abdominal closure w/ retention sutures for wound dehiscence 04/24/22 Dr. Grandville Silos   Readmitted with suspected 'enterocolitis' and some purulent drainage from her wound sutures but no evident abscess 05/04/22, discharged 11/14.    Returned to the ER 05/16/22 with approximate 2-day history of redness and some purulent type drainage from her retention suture site.  No nausea or vomiting. She was recommended imaging for further evaluation. She then left AMA  She returns with ongoing pain to abdominal wall having not improved. Ongoing redness. This time was able to have CT  Past Medical History:  Diagnosis Date   Abdominal pain    Accidental drug overdose April 2013   Anxiety    Atrial fibrillation ([place]) 09/29/11   converted spontaneously   Chronic back pain    Chronic knee pain    Chronic nausea    Chronic pain    COPD (chronic obstructive pulmonary disease) ([place])    Depression    Diabetes mellitus    states her doctor took her off all DM meds in past month   Diabetic neuropathy ([place])    Dyspnea    with exertion    GERD (gastroesophageal reflux disease)    Headache(784.0)    migraines    HTN (hypertension)    not on meds since in a year    Hyperlipidemia    Hypothyroidism    not on meds in a while    Mental disorder    Bipolar and schizophrenic   Requires supplemental oxygen    as needed per patient    Schizophrenia ([place])    Schizophrenia, acute ([place]) 11/13/2017   Tobacco abuse     Past Surgical History:  Procedure Laterality Date   ABDOMINAL  HYSTERECTOMY     BLADDER SUSPENSION  03/04/2011   Procedure: [facility] PROCEDURE;  Surgeon: Elayne Snare MacDiarmid;  Location: [facility];  Service: Urology;  Laterality: N/A;   BOWEL RESECTION N/A 04/18/2022   Procedure: SMALL BOWEL RESECTION;  Surgeon: Erroll Luna, MD;  Location: [facility];  Service: General;  Laterality: N/A;   CYSTOCELE REPAIR  03/04/2011   Procedure: ANTERIOR REPAIR (CYSTOCELE);  Surgeon: Reece Packer;  Location: [facility];  Service: Urology;  Laterality: N/A;   CYSTOSCOPY  03/04/2011   Procedure: CYSTOSCOPY;  Surgeon: Elayne Snare MacDiarmid;  Location: [facility];  Service: Urology;  Laterality: N/A;   ESOPHAGOGASTRODUODENOSCOPY (EGD) WITH PROPOFOL N/A 05/12/2017   Procedure: ESOPHAGOGASTRODUODENOSCOPY (EGD) WITH PROPOFOL;  Surgeon: Alphonsa Overall, MD;  Location: [facility];  Service: General;  Laterality: N/A;   GASTRIC ROUX-EN-Y N/A 03/25/2016   Procedure: LAPAROSCOPIC ROUX-EN-Y GASTRIC BYPASS WITH UPPER ENDOSCOPY;  Surgeon: Excell Seltzer, MD;  Location: [facility];  Service: General;  Laterality: N/A;   KNEE SURGERY     LAPAROSCOPIC ASSISTED VAGINAL HYSTERECTOMY  03/04/2011   Procedure: LAPAROSCOPIC ASSISTED VAGINAL HYSTERECTOMY;  Surgeon: Cyril Mourning, MD;  Location: [facility];  Service: Gynecology;  Laterality: N/A;   LAPAROTOMY N/A 04/18/2022   Procedure: EXPLORATORY LAPAROTOMY;  Surgeon: Erroll Luna, MD;  Location: [facility];  Service: General;  Laterality: N/A;   LAPAROTOMY  N/A 04/24/2022   Procedure: BRING BACK EXPLORATORY LAPAROTOMY;  Surgeon: Georganna Skeans, MD;  Location: [facility];  Service: General;  Laterality: N/A;    Family History  Problem Relation Age of Onset   Heart attack Father        63s   Diabetes Mother    Heart disease Mother    Hypertension Mother    Heart attack Sister        36   COPD Other    Breast cancer Neg Hx     Social:  reports that she has been smoking cigarettes. She has been smoking an average of .5 packs per day. She has never used smokeless  tobacco. She reports current alcohol use. She reports that she does not currently use drugs after having used the following drugs: Cocaine, Marijuana, and "Crack" cocaine.  Allergies:  Allergies  Allergen Reactions   Iron Dextran Shortness Of Breath and Anxiety   Aspirin Nausea And Vomiting and Other (See Comments)    Ok to take tylenol or ibuprofen     Medications: I have reviewed the patient's current medications.  Results for orders placed or performed during the hospital encounter of 05/18/22 (from the past 48 hour(s))  Lactic acid, plasma     Status: None   Collection Time: 05/18/22  1:34 PM  Result Value Ref Range   Lactic Acid, Venous 1.8 0.5 - 1.9 mmol/L    Comment: Performed at [facility], [address], [place], [place] [address]  Lactic acid, plasma     Status: Abnormal   Collection Time: 05/18/22  1:49 PM  Result Value Ref Range   Lactic Acid, Venous 2.3 (HH) 0.5 - 1.9 mmol/L    Comment: CRITICAL RESULT CALLED TO, READ BACK BY AND VERIFIED WITH M. FOWLER RN 05/18/22 '@1504'$  BY J. Braelyn Jenson Performed at [facility] [address], [place], [place] [address]   Comprehensive metabolic panel     Status: Abnormal   Collection Time: 05/18/22  1:49 PM  Result Value Ref Range   Sodium 139 135 - 145 mmol/L   Potassium 3.8 3.5 - 5.1 mmol/L   Chloride 106 98 - 111 mmol/L   CO2 19 (L) 22 - 32 mmol/L   Glucose, Bld 105 (H) 70 - 99 mg/dL    Comment: Glucose reference range applies only to samples taken after fasting for at least 8 hours.   BUN <5 (L) 6 - 20 mg/dL   Creatinine, Ser <0.30 (L) 0.44 - 1.00 mg/dL   Calcium 9.0 8.9 - 10.3 mg/dL   Total Protein 7.5 6.5 - 8.1 g/dL   Albumin 3.4 (L) 3.5 - 5.0 g/dL   AST 14 (L) 15 - 41 U/L   ALT 10 0 - 44 U/L   Alkaline Phosphatase 89 38 - 126 U/L   Total Bilirubin 0.5 0.3 - 1.2 mg/dL   GFR, Estimated NOT CALCULATED >60 mL/min    Comment: (NOTE) Calculated using the CKD-EPI Creatinine Equation (2021)    Anion gap 14 5 -  15    Comment: Performed at [facility] [address] [place], [place] [address]  CBC with Differential     Status: Abnormal   Collection Time: 05/18/22  1:49 PM  Result Value Ref Range   WBC 10.7 (H) 4.0 - 10.5 K/uL   RBC 4.68 3.87 - 5.11 MIL/uL   Hemoglobin 13.5 12.0 - 15.0 g/dL   HCT 43.4 36.0 - 46.0 %   MCV  92.7 80.0 - 100.0 fL   MCH 28.8 26.0 - 34.0 pg   MCHC 31.1 30.0 - 36.0 g/dL   RDW 15.9 (H) 11.5 - 15.5 %   Platelets 331 150 - 400 K/uL   nRBC 0.0 0.0 - 0.2 %   Neutrophils Relative % 63 %   Neutro Abs 6.8 1.7 - 7.7 K/uL   Lymphocytes Relative 29 %   Lymphs Abs 3.1 0.7 - 4.0 K/uL   Monocytes Relative 4 %   Monocytes Absolute 0.4 0.1 - 1.0 K/uL   Eosinophils Relative 3 %   Eosinophils Absolute 0.4 0.0 - 0.5 K/uL   Basophils Relative 1 %   Basophils Absolute 0.1 0.0 - 0.1 K/uL   Immature Granulocytes 0 %   Abs Immature Granulocytes 0.04 0.00 - 0.07 K/uL    Comment: Performed at [facility] [address] [place], [place] [address]  Urinalysis, Routine w reflex microscopic Urine, Clean Catch     Status: Abnormal   Collection Time: 05/18/22  2:12 PM  Result Value Ref Range   Color, Urine COLORLESS (A) YELLOW   APPearance CLEAR CLEAR   Specific Gravity, Urine 1.002 (L) 1.005 - 1.030   pH 5.0 5.0 - 8.0   Glucose, UA NEGATIVE NEGATIVE mg/dL   Hgb urine dipstick NEGATIVE NEGATIVE   Bilirubin Urine NEGATIVE NEGATIVE   Ketones, ur NEGATIVE NEGATIVE mg/dL   Protein, ur NEGATIVE NEGATIVE mg/dL   Nitrite NEGATIVE NEGATIVE   Leukocytes,Ua NEGATIVE NEGATIVE    Comment: Performed at [facility] [address], [place], [place] [address]  Lipase, blood     Status: None   Collection Time: 05/18/22  4:26 PM  Result Value Ref Range   Lipase 40 11 - 51 U/L    Comment: Performed at [facility], [place] [address], [place], [place] [address]    CT Abdomen Pelvis W Contrast  Result Date: 05/18/2022 CLINICAL DATA:  Increased pain about the abdominal  wound. Concern for infection post surgery. Redness noted around incision EXAM: CT ABDOMEN AND PELVIS WITH CONTRAST TECHNIQUE: Multidetector CT imaging of the abdomen and pelvis was performed using the standard protocol following bolus administration of intravenous contrast. RADIATION DOSE REDUCTION: This exam was performed according to the departmental dose-optimization program which includes automated exposure control, adjustment of the mA and/or kV according to patient size and/or use of iterative reconstruction technique. CONTRAST:  65m OMNIPAQUE IOHEXOL 350 MG/ML SOLN COMPARISON:  CT abdomen and pelvis 05/04/2022 FINDINGS: Lower chest: No acute abnormality. Hepatobiliary: No focal liver abnormality is seen. No gallstones, gallbladder wall thickening, or biliary dilatation. Pancreas: Unremarkable. Unchanged prominence of the main pancreatic duct. Spleen: Normal in size without focal abnormality. Adrenals/Urinary Tract: Normal adrenal glands. No urinary calculi or hydronephrosis. Unremarkable bladder. Stomach/Bowel: Postoperative change of small-bowel resection with anastomosis in the right hemipelvis. 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 The previous bowel wall thickening of the small bowel and duodenum has nearly resolved. Normal caliber large and small bowel. Vascular/Lymphatic: Aortic atherosclerosis. No enlarged abdominal or pelvic lymph nodes. Reproductive: Status post hysterectomy. No adnexal masses. Other: No free intraperitoneal air. Musculoskeletal: Decreased soft tissue thickening and adjacent edema within the midline anterior subcutaneous fat along the incision. There is a new 2.7 x 1.4 cm fluid collection along the left paramidline midline incision (3/42). Decreased edema and fascial thickening about the rectus abdominis muscles compatible with improving postoperative change. No acute osseous abnormality.  Chronic T9 compression fracture. IMPRESSION: 1. There  is a new 2.7 cm  fluid collection along the left aspect of the incision which could represent a developing abscess. Otherwise decreased inflammatory change about the anterior abdominal wall midline incision since 05/04/2022. 2. 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 The previous bowel wall thickening has nearly resolved. Electronically Signed   By: Placido Sou M.D.   On: 05/18/2022 17:55    ROS - all of the below systems have been reviewed with the patient and positives are indicated with bold text General: chills, fever or night sweats Eyes: blurry vision or double vision ENT: epistaxis or sore throat Allergy/Immunology: itchy/watery eyes or nasal congestion Hematologic/Lymphatic: bleeding problems, blood clots or swollen lymph nodes Endocrine: temperature intolerance or unexpected weight changes Breast: new or changing breast lumps or nipple discharge Resp: cough, shortness of breath, or wheezing CV: chest pain or dyspnea on exertion GI: as per HPI GU: dysuria, trouble voiding, or hematuria MSK: joint pain or joint stiffness Neuro: TIA or stroke symptoms Derm: pruritus and skin lesion changes Psych: anxiety and depression  PE Blood pressure 135/76, pulse (!) 101, temperature 98.2 F (36.8 C), temperature source Oral, resp. rate 18, last menstrual period 01/08/2011, SpO2 95 %. Constitutional: NAD; conversant Eyes: Moist conjunctiva Lungs: Normal respiratory effort CV: RRR GI: Abd soft, erythema around retentions, some purulent drainage MSK: Normal range of motion of extremities Psychiatric: Appropriate affect  Results for orders placed or performed during the hospital encounter of 05/18/22 (from the past 48 hour(s))  Lactic acid, plasma     Status: None   Collection Time: 05/18/22  1:34 PM  Result Value Ref Range   Lactic Acid, Venous 1.8 0.5 - 1.9 mmol/L    Comment: Performed  at [facility], [address], [place], [place] [address]  Lactic acid, plasma     Status: Abnormal   Collection Time: 05/18/22  1:49 PM  Result Value Ref Range   Lactic Acid, Venous 2.3 (HH) 0.5 - 1.9 mmol/L    Comment: CRITICAL RESULT CALLED TO, READ BACK BY AND VERIFIED WITH M. FOWLER RN 05/18/22 '@1504'$  BY J. Shadiamond Koska Performed at [facility] [address], [place], [place] [address]   Comprehensive metabolic panel     Status: Abnormal   Collection Time: 05/18/22  1:49 PM  Result Value Ref Range   Sodium 139 135 - 145 mmol/L   Potassium 3.8 3.5 - 5.1 mmol/L   Chloride 106 98 - 111 mmol/L   CO2 19 (L) 22 - 32 mmol/L   Glucose, Bld 105 (H) 70 - 99 mg/dL    Comment: Glucose reference range applies only to samples taken after fasting for at least 8 hours.   BUN <5 (L) 6 - 20 mg/dL   Creatinine, Ser <0.30 (L) 0.44 - 1.00 mg/dL   Calcium 9.0 8.9 - 10.3 mg/dL   Total Protein 7.5 6.5 - 8.1 g/dL   Albumin 3.4 (L) 3.5 - 5.0 g/dL   AST 14 (L) 15 - 41 U/L   ALT 10 0 - 44 U/L   Alkaline Phosphatase 89 38 - 126 U/L   Total Bilirubin 0.5 0.3 - 1.2 mg/dL   GFR, Estimated NOT CALCULATED >60 mL/min    Comment: (NOTE) Calculated using the CKD-EPI Creatinine Equation (2021)    Anion gap 14 5 - 15    Comment: Performed at [facility] [address], [place], [place]  [address]  CBC with Differential     Status: Abnormal   Collection Time: 05/18/22  1:49 PM  Result Value Ref Range   WBC 10.7 (H) 4.0 - 10.5 K/uL   RBC 4.68 3.87 - 5.11 MIL/uL   Hemoglobin 13.5 12.0 - 15.0 g/dL   HCT 43.4 36.0 - 46.0 %   MCV 92.7 80.0 - 100.0 fL   MCH 28.8 26.0 - 34.0 pg   MCHC 31.1 30.0 - 36.0 g/dL   RDW 15.9 (H) 11.5 - 15.5 %   Platelets 331 150 - 400 K/uL   nRBC 0.0 0.0 - 0.2 %   Neutrophils Relative % 63 %   Neutro Abs 6.8 1.7 - 7.7 K/uL   Lymphocytes Relative 29 %   Lymphs Abs 3.1 0.7 - 4.0 K/uL   Monocytes Relative 4 %   Monocytes Absolute 0.4 0.1 - 1.0 K/uL   Eosinophils  Relative 3 %   Eosinophils Absolute 0.4 0.0 - 0.5 K/uL   Basophils Relative 1 %   Basophils Absolute 0.1 0.0 - 0.1 K/uL   Immature Granulocytes 0 %   Abs Immature Granulocytes 0.04 0.00 - 0.07 K/uL    Comment: Performed at [facility], [address], [place], [place] [address]  Urinalysis, Routine w reflex microscopic Urine, Clean Catch     Status: Abnormal   Collection Time: 05/18/22  2:12 PM  Result Value Ref Range   Color, Urine COLORLESS (A) YELLOW   APPearance CLEAR CLEAR   Specific Gravity, Urine 1.002 (L) 1.005 - 1.030   pH 5.0 5.0 - 8.0   Glucose, UA NEGATIVE NEGATIVE mg/dL   Hgb urine dipstick NEGATIVE NEGATIVE   Bilirubin Urine NEGATIVE NEGATIVE   Ketones, ur NEGATIVE NEGATIVE mg/dL   Protein, ur NEGATIVE NEGATIVE mg/dL   Nitrite NEGATIVE NEGATIVE   Leukocytes,Ua NEGATIVE NEGATIVE    Comment: Performed at [facility] [address], [place], [place] [address]  Lipase, blood     Status: None   Collection Time: 05/18/22  4:26 PM  Result Value Ref Range   Lipase 40 11 - 51 U/L    Comment: Performed at [facility], [place] [address] [place] [address], [place],  [address]    CT Abdomen Pelvis W Contrast  Result Date: 05/18/2022 CLINICAL DATA:  Increased pain about the abdominal wound. Concern for infection post surgery. Redness noted around incision EXAM: CT ABDOMEN AND PELVIS WITH CONTRAST TECHNIQUE: Multidetector CT imaging of the abdomen and pelvis was performed using the standard protocol following bolus administration of intravenous contrast. RADIATION DOSE REDUCTION: This exam was performed according to the departmental dose-optimization program which includes automated exposure control, adjustment of the mA and/or kV according to patient size and/or use of iterative reconstruction technique. CONTRAST:  59m OMNIPAQUE IOHEXOL 350 MG/ML SOLN COMPARISON:  CT abdomen and pelvis 05/04/2022 FINDINGS: Lower chest: No acute abnormality. Hepatobiliary: No focal liver  abnormality is seen. No gallstones, gallbladder wall thickening, or biliary dilatation. Pancreas: Unremarkable. Unchanged prominence of the main pancreatic duct. Spleen: Normal in size without focal abnormality. Adrenals/Urinary Tract: Normal adrenal glands. No urinary calculi or hydronephrosis. Unremarkable bladder. Stomach/Bowel: Postoperative change of small-bowel resection with anastomosis in the right hemipelvis. 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 The previous bowel wall thickening of the small bowel and duodenum has nearly resolved. Normal caliber large and small bowel. Vascular/Lymphatic: Aortic atherosclerosis. No enlarged abdominal or pelvic lymph nodes. Reproductive: Status post hysterectomy. No adnexal masses. Other: No free intraperitoneal air.  Musculoskeletal: Decreased soft tissue thickening and adjacent edema within the midline anterior subcutaneous fat along the incision. There is a new 2.7 x 1.4 cm fluid collection along the left paramidline midline incision (3/42). Decreased edema and fascial thickening about the rectus abdominis muscles compatible with improving postoperative change. No acute osseous abnormality.  Chronic T9 compression fracture. IMPRESSION: 1. There is a new 2.7 cm fluid collection along the left aspect of the incision which could represent a developing abscess. Otherwise decreased inflammatory change about the anterior abdominal wall midline incision since 05/04/2022. 2. 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 The previous bowel wall thickening has nearly resolved. Electronically Signed   By: Placido Sou M.D.   On: 05/18/2022 17:55      A/P: [name] is an 56 y.o. female with COPD, afib, DM with now recent dehisence requiring takeback and retention suture closure of abdomen 04/24/22 following exlap/SBR for SBO with  stricture 04/19/22 - now with apparent superficial SSI with abscess  -Admit for IV abx -Bedside attempts at I&D and possible retention suture removal - will discuss case specifics with operating surgeon Dr. Grandville Silos in AM. If unable to tolerate, may require washout in OR  I spent a total of 45 minutes in both face-to-face and non-face-to-face activities, excluding procedures performed, for this visit on the date of this encounter.  Nadeen Landau, Colon Surgery, [place]

## 2022-05-19 ENCOUNTER — Ambulatory Visit: Payer: Medicare Other | Admitting: Family Medicine

## 2022-05-19 DIAGNOSIS — F250 Schizoaffective disorder, bipolar type: Secondary | ICD-10-CM | POA: Diagnosis not present

## 2022-05-19 DIAGNOSIS — J449 Chronic obstructive pulmonary disease, unspecified: Secondary | ICD-10-CM | POA: Diagnosis not present

## 2022-05-19 DIAGNOSIS — I2699 Other pulmonary embolism without acute cor pulmonale: Secondary | ICD-10-CM | POA: Diagnosis present

## 2022-05-19 DIAGNOSIS — E119 Type 2 diabetes mellitus without complications: Secondary | ICD-10-CM

## 2022-05-19 DIAGNOSIS — T8149XA Infection following a procedure, other surgical site, initial encounter: Secondary | ICD-10-CM | POA: Diagnosis not present

## 2022-05-19 LAB — BASIC METABOLIC PANEL
Anion gap: 8 (ref 5–15)
BUN: <5 mg/dL — ABNORMAL LOW (ref 6–20)
CO2: 24 mmol/L (ref 22–32)
Calcium: 8.4 mg/dL — ABNORMAL LOW (ref 8.9–10.3)
Chloride: 105 mmol/L (ref 98–111)
Creatinine, Ser: 0.35 mg/dL — ABNORMAL LOW (ref 0.44–1.00)
GFR, Estimated: >60 mL/min (ref >60)
Glucose, Bld: 104 mg/dL — ABNORMAL HIGH (ref 70–99)
Potassium: 4.1 mmol/L (ref 3.5–5.1)
Sodium: 137 mmol/L (ref 135–145)

## 2022-05-19 LAB — CBC
HCT: 36 % (ref 36.0–46.0)
Hemoglobin: 11.8 g/dL — ABNORMAL LOW (ref 12.0–15.0)
MCH: 29.7 pg (ref 26.0–34.0)
MCHC: 32.8 g/dL (ref 30.0–36.0)
MCV: 90.7 fL (ref 80.0–100.0)
Platelets: 249 10*3/uL (ref 150–400)
RBC: 3.97 MIL/uL (ref 3.87–5.11)
RDW: 15.8 % — ABNORMAL HIGH (ref 11.5–15.5)
WBC: 8.5 10*3/uL (ref 4.0–10.5)
nRBC: 0 % (ref 0.0–0.2)

## 2022-05-19 LAB — I-STAT BETA HCG BLOOD, ED (MC, WL, AP ONLY): I-stat hCG, quantitative: <5 m[IU]/mL (ref <5)

## 2022-05-19 LAB — CBG MONITORING, ED
Glucose-Capillary: 125 mg/dL — ABNORMAL HIGH (ref 70–99)
Glucose-Capillary: 82 mg/dL (ref 70–99)

## 2022-05-19 MED ORDER — TRAMADOL HCL 50 MG PO TABS: 50.0000 mg | ORAL_TABLET | Freq: Two times a day (BID) | ORAL | 0 refills | Status: DC | PRN

## 2022-05-19 MED ORDER — ENOXAPARIN SODIUM 30 MG/0.3ML IJ SOSY: 30.0000 mg | PREFILLED_SYRINGE | INTRAMUSCULAR | Status: DC

## 2022-05-19 MED ORDER — HYDROMORPHONE HCL 1 MG/ML IJ SOLN
1.0000 mg | INTRAMUSCULAR | Status: DC | PRN
  Administered 2022-05-19: 1 mg via INTRAVENOUS
  Filled 2022-05-19: qty 1

## 2022-05-19 MED ORDER — AMOXICILLIN-POT CLAVULANATE 875-125 MG PO TABS: 1.0000 | ORAL_TABLET | Freq: Two times a day (BID) | ORAL | 0 refills | Status: DC

## 2022-05-19 MED ORDER — ENOXAPARIN SODIUM 40 MG/0.4ML IJ SOSY: 40.0000 mg | PREFILLED_SYRINGE | INTRAMUSCULAR | Status: DC

## 2022-05-19 NOTE — Discharge Summary (Signed)
Physician Discharge Summary   Patient: [name] MRN: [id number] DOB: 1966-03-25  Admit date:     05/18/2022  Discharge date: 05/19/22  Discharge Physician: Oswald Hillock   PCP: [name], MD   Recommendations at discharge:   Follow-up general surgery as outpatient  Discharge Diagnoses: Principal Problem:   Postoperative wound infection Active Problems:   COPD (chronic obstructive pulmonary disease) (HCC)   Type 2 diabetes mellitus (HCC)   Schizoaffective disorder, bipolar type ([place])   Pulmonary embolism ([place])  Resolved Problems:   * No resolved hospital problems. *  Hospital Course:  56 year old female with medical history of SBO s/p exploratory laparotomy and small bowel resection with lysis of adhesions on 04/18/2022 followed by expert laparotomy, abdominal closure with retention sutures for wound dehiscence 47-8-29, complicated by postop wound infection, COPD, diabetes mellitus type 2, schizoaffective disorder, history of polysubstance use, previous suicide attempt in July 5621 complicated by aspiration pneumonia with respiratory failure requiring temporary tracheostomy placement came to ED for evaluation of worsening abdominal pain and concern for wound site infection.   Patient was admitted from 05/04/2022 to 05/06/2022 for wound infection/cellulitis, not requiring operative management.  She was discharged on course of Augmentin.   Returned to the ED on 11/24 with increased abdominal pain and discharged with abdominal wound.  She received vancomycin but developed pruritus.  Patient refused further evaluation and requested to be discharged.  She was given a prescription for Cipro and Flagyl.   Returns again with continued abdominal pain and area of erythema and swelling along the left aspect of her incision site.   CT abdomen/pelvis with contrast showed 2.7 cm fluid collection on the left aspect of the incision which could represent developing abscess.   Patient  started on IV vancomycin and Zosyn.  General surgery consulted  Assessment and Plan:  Postop wound infection -History of small bowel strictures s/p exploratory laparotomy/small bowel resection/lysis of adhesions on 04/18/2022 -S/p follow-up exploratory laparotomy, abdominal closure with retention sutures for wound dehiscence on 04/24/2022 -Presenting with postop wound infection; CT abdomen/pelvis shows new 2.7 cm fluid collection at the left aspect of the incision site concerning for developing abscess -Started on IV vancomycin -General surgery was consulted; retention sutures were removed at bedside, she had mild pus draining from superficial wound -Patient did not want to stay in the hospital and kept on leaving ER for smoking -Called and discussed with Dr. Rosendo Gros, he saw the patient and feels that patient only has superficial abscess, no intervention recommended. -He is okay with discharging her home on p.o. Augmentin for 7 days -Patient has an appointment to see surgery as outpatient -We will also discharge metoprolol 50 mg p.o. every 12 hours as needed for pain   COPD -Continue as needed albuterol   Schizoaffective disorder, bipolar type -Patient is currently not on medications    Diabetes mellitus type 2 -Hemoglobin A1c 6.5% -Patient is not on medical therapy as outpatient        Consultants: General surgery Procedures performed: Removal of retention sutures Disposition: Home Diet recommendation:  Discharge Diet Orders (From admission, onward)     Start     Ordered   05/19/22 0000  Diet - low sodium heart healthy        05/19/22 1719           Carb modified diet DISCHARGE MEDICATION: Allergies as of 05/19/2022       Reactions   Iron Dextran Shortness Of Breath, Anxiety   Aspirin Nausea  And Vomiting, Other (See Comments)   Ok to take tylenol or ibuprofen        Medication List     STOP taking these medications    ciprofloxacin 500 MG  tablet Commonly known as: CIPRO   metroNIDAZOLE 500 MG tablet Commonly known as: FLAGYL       TAKE these medications    acetaminophen 500 MG tablet Commonly known as: TYLENOL Take 2 tablets (1,000 mg total) by mouth every 6 (six) hours as needed for mild pain.   albuterol 108 (90 Base) MCG/ACT inhaler Commonly known as: ProAir HFA Inhale 2 puffs into the lungs every 6 (six) hours as needed for wheezing or shortness of breath.   amoxicillin-clavulanate 875-125 MG tablet Commonly known as: AUGMENTIN Take 1 tablet by mouth 2 (two) times daily.   bisacodyl 10 MG suppository Commonly known as: DULCOLAX Place 1 suppository (10 mg total) rectally daily as needed for moderate constipation.   docusate sodium 100 MG capsule Commonly known as: COLACE Take 1 capsule (100 mg total) by mouth 2 (two) times daily. What changed:  when to take this reasons to take this   folic acid 1 MG tablet Commonly known as: FOLVITE Take 1 tablet (1 mg total) by mouth daily.   ipratropium-albuterol 0.5-2.5 (3) MG/3ML Soln Commonly known as: DUONEB Take 3 mLs by nebulization every 6 (six) hours as needed.   methocarbamol 750 MG tablet Commonly known as: ROBAXIN Take 1 tablet (750 mg total) by mouth every 6 (six) hours as needed for muscle spasms.   naloxone 4 MG/0.1ML Liqd nasal spray kit Commonly known as: NARCAN Place 1 spray into the nose once.   nicotine 21 mg/24hr patch Commonly known as: NICODERM CQ - dosed in mg/24 hours Place 1 patch (21 mg total) onto the skin daily.   ondansetron 4 MG tablet Commonly known as: ZOFRAN Take 1 tablet (4 mg total) by mouth every 6 (six) hours as needed for nausea.   traMADol 50 MG tablet Commonly known as: ULTRAM Take 1 tablet (50 mg total) by mouth every 12 (twelve) hours as needed for severe pain (pain not relieved by oxycodone).               Discharge Care Instructions  (From admission, onward)           Start     Ordered    05/19/22 0000  Discharge wound care:       Comments: Change dressing daily   05/19/22 1719            Discharge Exam: There were no vitals filed for this visit. Alert, oriented x3 Abdomen-dressing in place in  midline  Condition at discharge: good  The results of significant diagnostics from this hospitalization (including imaging, microbiology, ancillary and laboratory) are listed below for reference.   Imaging Studies: CT Abdomen Pelvis W Contrast  Result Date: 05/18/2022 CLINICAL DATA:  Increased pain about the abdominal wound. Concern for infection post surgery. Redness noted around incision EXAM: CT ABDOMEN AND PELVIS WITH CONTRAST TECHNIQUE: Multidetector CT imaging of the abdomen and pelvis was performed using the standard protocol following bolus administration of intravenous contrast. RADIATION DOSE REDUCTION: This exam was performed according to the departmental dose-optimization program which includes automated exposure control, adjustment of the mA and/or kV according to patient size and/or use of iterative reconstruction technique. CONTRAST:  67m OMNIPAQUE IOHEXOL 350 MG/ML SOLN COMPARISON:  CT abdomen and pelvis 05/04/2022 FINDINGS: Lower chest: No acute abnormality. Hepatobiliary: No  focal liver abnormality is seen. No gallstones, gallbladder wall thickening, or biliary dilatation. Pancreas: Unremarkable. Unchanged prominence of the main pancreatic duct. Spleen: Normal in size without focal abnormality. Adrenals/Urinary Tract: Normal adrenal glands. No urinary calculi or hydronephrosis. Unremarkable bladder. Stomach/Bowel: Postoperative change of small-bowel resection with anastomosis in the right hemipelvis. 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 The previous bowel wall thickening of the small bowel and duodenum has nearly resolved. Normal caliber large and small bowel. Vascular/Lymphatic: Aortic atherosclerosis. No enlarged abdominal or pelvic lymph  nodes. Reproductive: Status post hysterectomy. No adnexal masses. Other: No free intraperitoneal air. Musculoskeletal: Decreased soft tissue thickening and adjacent edema within the midline anterior subcutaneous fat along the incision. There is a new 2.7 x 1.4 cm fluid collection along the left paramidline midline incision (3/42). Decreased edema and fascial thickening about the rectus abdominis muscles compatible with improving postoperative change. No acute osseous abnormality.  Chronic T9 compression fracture. IMPRESSION: 1. There is a new 2.7 cm fluid collection along the left aspect of the incision which could represent a developing abscess. Otherwise decreased inflammatory change about the anterior abdominal wall midline incision since 05/04/2022. 2. 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 The previous bowel wall thickening has nearly resolved. Electronically Signed   By: Placido Sou M.D.   On: 05/18/2022 17:55   CT ABDOMEN PELVIS W CONTRAST  Result Date: 05/04/2022 CLINICAL DATA:  56 year old female with pain and possible sepsis. Ileus versus small-bowel obstruction on CT last month, 2 abdominal surgeries since that time, including - 04/18/2022 resection of adhesions and resection of a distal small bowel stricture in the right lower quadrant. - exploratory laparotomy on 04/24/2022 with negative surgical evaluation of the small and large bowel at that time. EXAM: CT ABDOMEN AND PELVIS WITH CONTRAST TECHNIQUE: Multidetector CT imaging of the abdomen and pelvis was performed using the standard protocol following bolus administration of intravenous contrast. RADIATION DOSE REDUCTION: This exam was performed according to the departmental dose-optimization program which includes automated exposure control, adjustment of the mA and/or kV according to patient size and/or use of iterative  reconstruction technique. CONTRAST:  72m OMNIPAQUE IOHEXOL 350 MG/ML SOLN COMPARISON:  CT Abdomen and Pelvis 04/17/2022 and earlier. FINDINGS: Lower chest: Negative lung bases. No cardiomegaly or pericardial effusion. Hepatobiliary: Negative liver and gallbladder. Pancreas: Chronic main pancreatic ductal enlargement, new since 2019. No pancreatic atrophy or inflammation identified. Spleen: Diminutive, negative. Adrenals/Urinary Tract: Normal adrenal glands. Symmetric renal enhancement and contrast excretion. No nephrolithiasis or convincing pararenal inflammation. Unremarkable bladder. Occasional chronic pelvic phleboliths are stable since 2019. Stomach/Bowel: Chronic gastric bypass, gastrojejunostomy Roux-en-Y type changes. No free air or free fluid identified. No fluid collection identified in the abdomen or pelvis. No dilated large or small bowel loops today. However, there is generalized bowel wall thickening involving multiple both proximal and distal small bowel loops (including the duodenum on series 3, image 35) and also the large bowel from the distal descending colon through the rectosigmoid (series 3, image 63). Upstream retained low-density stool and gas in the proximal colon. New distal small bowel anastomosis in the right lower quadrant since the CT last month consistent with surgical history. No evidence of obstruction. Vascular/Lymphatic: Aortoiliac calcified atherosclerosis. Major arterial structures in the abdomen and pelvis remain patent. Portal venous system appears patent. No lymphadenopathy identified. Reproductive: Other: No pelvic free fluid. Musculoskeletal: Partially visible chronic T9 compression fracture. Stable visualized osseous structures.  Recent postoperative changes to the ventral abdominal wall with no adverse features. IMPRESSION: 1. Interval laparotomy and resected distal small bowel stricture with primary reanastomosis. Underlying chronic Roux-en-Y type gastric bypass. 2.  Widespread thickened but nondilated small bowel loops today, with similar generalized large bowel thickening from the distal descending colon to the rectum. This is nonspecific, but consider infectious or inflammatory enterocolitis. No evidence of bowel obstruction. No postoperative abscess. 3. Otherwise stable CT appearance of the abdomen and pelvis. Electronically Signed   By: Genevie Ann M.D.   On: 05/04/2022 07:30   DG Chest Port 1 View  Result Date: 05/04/2022 CLINICAL DATA:  56 year old female with possible sepsis.  Pain. EXAM: PORTABLE CHEST 1 VIEW COMPARISON:  CTA chest 04/04/2022 and earlier. FINDINGS: Portable AP upright view at 0419 hours. Lung volumes and mediastinal contours are stable and within normal limits. Allowing for portable technique the lungs are clear. No pneumothorax or pleural effusion. Visualized tracheal air column is within normal limits. Negative visible bowel gas and osseous structures; chronic T9 compression fracture better demonstrated by CT. IMPRESSION: Negative portable chest. Electronically Signed   By: Genevie Ann M.D.   On: 05/04/2022 05:24    Microbiology: Results for orders placed or performed during the hospital encounter of 05/04/22  Urine Culture     Status: Abnormal   Collection Time: 05/04/22  3:59 AM   Specimen: In/Out Cath Urine  Result Value Ref Range Status   Specimen Description IN/OUT CATH URINE  Final   Special Requests   Final    NONE Performed at [facility], [place] [address] [place], [place] [address]    Culture MULTIPLE SPECIES PRESENT, SUGGEST RECOLLECTION (A)  Final   Report Status 05/05/2022 FINAL  Final  Blood Culture (routine x 2)     Status: None   Collection Time: 05/04/22  4:05 AM   Specimen: BLOOD  Result Value Ref Range Status   Specimen Description BLOOD RIGHT ANTECUBITAL  Final   Special Requests   Final    BOTTLES DRAWN AEROBIC AND ANAEROBIC Blood Culture adequate volume   Culture   Final    NO GROWTH 5 DAYS Performed at  [facility], [place] [address] [place], [place] [address]    Report Status 05/09/2022 FINAL  Final  Blood Culture (routine x 2)     Status: None   Collection Time: 05/04/22  4:15 AM   Specimen: BLOOD RIGHT HAND  Result Value Ref Range Status   Specimen Description BLOOD RIGHT HAND  Final   Special Requests AEROBIC BOTTLE ONLY Blood Culture adequate volume  Final   Culture   Final    NO GROWTH 5 DAYS Performed at [facility], [place] [address], [place], [place] [address]    Report Status 05/09/2022 FINAL  Final    Labs: CBC: Recent Labs  Lab 05/16/22 1316 05/18/22 1349 05/19/22 0424  WBC 11.1* 10.7* 8.5  NEUTROABS 7.9* 6.8  --   HGB 13.3 13.5 11.8*  HCT 39.8 43.4 36.0  MCV 87.7 92.7 90.7  PLT 334 331 388   Basic Metabolic Panel: Recent Labs  Lab 05/16/22 1316 05/18/22 1349 05/19/22 0424  NA 137 139 137  K 3.7 3.8 4.1  CL 106 106 105  CO2 21* 19* 24  GLUCOSE 93 105* 104*  BUN <5* <5* <5*  CREATININE 0.38* <0.30* 0.35*  CALCIUM 8.8* 9.0 8.4*   Liver Function Tests: Recent Labs  Lab 05/16/22 1316 05/18/22 1349  AST  12* 14*  ALT 12 10  ALKPHOS 93 89  BILITOT 0.4 0.5  PROT 7.0 7.5  ALBUMIN 3.1* 3.4*   CBG: Recent Labs  Lab 05/19/22 0749 05/19/22 1130  GLUCAP 125* 82    Discharge time spent: greater than 30 minutes.  Signed: Oswald Hillock, MD [facility] 05/19/2022

## 2022-05-19 NOTE — Progress Notes (Signed)
[facility]  PROGRESS NOTE  [name] KWI:[id number] DOB: 1966/02/02 DOA: 05/18/2022 PCP: [name], MD   Brief HPI:   56 year old female with medical history of SBO s/p exploratory laparotomy and small bowel resection with lysis of adhesions on 04/18/2022 followed by expert laparotomy, abdominal closure with retention sutures for wound dehiscence 24-2-68, complicated by postop wound infection, COPD, diabetes mellitus type 2, schizoaffective disorder, history of polysubstance use, previous suicide attempt in July 3419 complicated by aspiration pneumonia with respiratory failure requiring temporary tracheostomy placement came to ED for evaluation of worsening abdominal pain and concern for wound site infection.  Patient was admitted from 05/04/2022 to 05/06/2022 for wound infection/cellulitis, not requiring operative management.  She was discharged on course of Augmentin.  Returned to the ED on 11/24 with increased abdominal pain and discharged with abdominal wound.  She received vancomycin but developed pruritus.  Patient refused further evaluation and requested to be discharged.  She was given a prescription for Cipro and Flagyl.   Returns again with continued abdominal pain and area of erythema and swelling along the left aspect of her incision site.  CT abdomen/pelvis with contrast showed 2.7 cm fluid collection on the left aspect of the incision which could represent developing abscess.  Patient started on IV vancomycin and Zosyn.  General surgery consulted      Subjective   Patient seen and examined, denies pain.   Assessment/Plan:     Postop wound infection -History of small bowel strictures s/p exploratory laparotomy/small bowel resection/lysis of adhesions on 04/18/2022 -S/p follow-up exploratory laparotomy, abdominal closure with retention sutures for wound dehiscence on 04/24/2022 -Presenting with postop wound infection; CT abdomen/pelvis shows new 2.7 cm  fluid collection at the left aspect of the incision site concerning for developing abscess -Started on IV vancomycin -General surgery consulted  COPD -Continue as needed albuterol  Schizoaffective disorder, bipolar type -Patient is currently not on medications -Previously she was on Abilify and as needed Ativan and Risperdal  Diabetes mellitus type 2 -Hemoglobin A1c 6.5% -Patient is not on medical therapy as outpatient -Continue sliding scale insulin with NovoLog    Medications     insulin aspart  0-9 Units Subcutaneous TID WC     Data Reviewed:   CBG:  Recent Labs  Lab 05/19/22 0749  GLUCAP 125*    SpO2: 94 % O2 Flow Rate (L/min): 2 L/min    Vitals:   05/19/22 0500 05/19/22 0645 05/19/22 0740 05/19/22 0755  BP: (!) 136/91 (!) 141/83 135/82   Pulse: 86 88 96   Resp: '14 19 15   '$ Temp:    98 F (36.7 C)  TempSrc:      SpO2: 98% 98% 94%       Data Reviewed:  Basic Metabolic Panel: Recent Labs  Lab 05/16/22 1316 05/18/22 1349 05/19/22 0424  NA 137 139 137  K 3.7 3.8 4.1  CL 106 106 105  CO2 21* 19* 24  GLUCOSE 93 105* 104*  BUN <5* <5* <5*  CREATININE 0.38* <0.30* 0.35*  CALCIUM 8.8* 9.0 8.4*    CBC: Recent Labs  Lab 05/16/22 1316 05/18/22 1349 05/19/22 0424  WBC 11.1* 10.7* 8.5  NEUTROABS 7.9* 6.8  --   HGB 13.3 13.5 11.8*  HCT 39.8 43.4 36.0  MCV 87.7 92.7 90.7  PLT 334 331 249    LFT Recent Labs  Lab 05/16/22 1316 05/18/22 1349  AST 12* 14*  ALT 12 10  ALKPHOS 93 89  BILITOT 0.4 0.5  PROT 7.0 7.5  ALBUMIN 3.1* 3.4*     Antibiotics: Anti-infectives (From admission, onward)    Start     Dose/Rate Route Frequency Ordered Stop   05/19/22 1700  vancomycin (VANCOREADY) IVPB 750 mg/150 mL        750 mg 150 mL/hr over 60 Minutes Intravenous Every 24 hours 05/18/22 1703     05/18/22 1630  piperacillin-tazobactam (ZOSYN) IVPB 3.375 g        3.375 g 100 mL/hr over 30 Minutes Intravenous  Once 05/18/22 1625 05/18/22 1806    05/18/22 1630  vancomycin (VANCOCIN) IVPB 1000 mg/200 mL premix        1,000 mg 200 mL/hr over 60 Minutes Intravenous  Once 05/18/22 1628 05/18/22 2022        DVT prophylaxis: SCDs  Code Status: Full code  Family Communication: No family at bedside   CONSULTS General surgery   Objective    Physical Examination:   General: Appears in no acute distress Cardiovascular: S1-S2, regular, no murmur auscultated Respiratory: Clear to auscultation bilaterally Abdomen: Soft, nontender, retention sutures in place Extremities: No edema in the lower extremities Neurologic:Alert , oriented X3, no focal deficit noted Skin :    Status is: Inpatient:          Oswald Hillock   [facility] If 7PM-7AM, please contact night-coverage at [email or web address], Office  [phone]   05/19/2022, 8:10 AM  LOS: 0 days

## 2022-05-19 NOTE — Care Management Obs Status (Signed)
[facility] NOTIFICATION   Patient Details  Name: [name] MRN: [id number] Date of Birth: [birth year]   Medicare Observation Status Notification Given:  Yes    Fuller Mandril, RN 05/19/2022, 5:10 PM

## 2022-05-19 NOTE — Care Management CC44 (Signed)
Condition Code 44 Documentation Completed  Patient Details  Name: [name] MRN: [id number] Date of Birth: September 02, 1965   Condition Code 44 given:  Yes Patient signature on Condition Code 44 notice:  Yes Documentation of 2 MD's agreement:  Yes Code 44 added to claim:  Yes    Fuller Mandril, RN 05/19/2022, 5:10 PM

## 2022-05-19 NOTE — Progress Notes (Signed)
Progress Note     Subjective: Having pain at midline wound. Hungry. Had been tolerating a diet without nausea, emesis and had been having regular bowel movements   Objective: Vital signs in last 24 hours: Temp:  [97.8 F (36.6 C)-98.4 F (36.9 C)] 98.4 F (36.9 C) (11/27 1259) Pulse Rate:  [85-120] 92 (11/27 1000) Resp:  [14-29] 17 (11/27 1000) BP: (123-157)/(76-125) 148/89 (11/27 1000) SpO2:  [93 %-99 %] 95 % (11/27 1000) Last BM Date : 05/18/22  Intake/Output from previous day: 11/26 0701 - 11/27 0700 In: 1393 [IV Piggyback:1393] Out: -  Intake/Output this shift: No intake/output data recorded.  PE: General: pleasant, WD, female who is laying in bed in NAD Lungs: Respiratory effort nonlabored Abd: soft, erythema and purulent drainage from midline wound. Retention sutures removed. Small 1x1 cm opening mid incision with moderate amount of bloody purulent fluid expressed. See below MSK: all 4 extremities are symmetrical with no cyanosis, clubbing, or edema. Skin: warm and dry Psych: A&Ox3 with an appropriate affect.      Lab Results:  Recent Labs    05/18/22 1349 05/19/22 0424  WBC 10.7* 8.5  HGB 13.5 11.8*  HCT 43.4 36.0  PLT 331 249   BMET Recent Labs    05/18/22 1349 05/19/22 0424  NA 139 137  K 3.8 4.1  CL 106 105  CO2 19* 24  GLUCOSE 105* 104*  BUN <5* <5*  CREATININE <0.30* 0.35*  CALCIUM 9.0 8.4*   PT/INR No results for input(s): "LABPROT", "INR" in the last 72 hours. CMP     Component Value Date/Time   NA 137 05/19/2022 0424   K 4.1 05/19/2022 0424   CL 105 05/19/2022 0424   CO2 24 05/19/2022 0424   GLUCOSE 104 (H) 05/19/2022 0424   BUN <5 (L) 05/19/2022 0424   CREATININE 0.35 (L) 05/19/2022 0424   CREATININE 0.57 03/28/2021 0000   CALCIUM 8.4 (L) 05/19/2022 0424   PROT 7.5 05/18/2022 1349   ALBUMIN 3.4 (L) 05/18/2022 1349   AST 14 (L) 05/18/2022 1349   ALT 10 05/18/2022 1349   ALKPHOS 89 05/18/2022 1349   BILITOT 0.5  05/18/2022 1349   GFRNONAA >60 05/19/2022 0424   GFRAA >60 08/03/2018 1514   Lipase     Component Value Date/Time   LIPASE 40 05/18/2022 1626       Studies/Results: CT Abdomen Pelvis W Contrast  Result Date: 05/18/2022 CLINICAL DATA:  Increased pain about the abdominal wound. Concern for infection post surgery. Redness noted around incision EXAM: CT ABDOMEN AND PELVIS WITH CONTRAST TECHNIQUE: Multidetector CT imaging of the abdomen and pelvis was performed using the standard protocol following bolus administration of intravenous contrast. RADIATION DOSE REDUCTION: This exam was performed according to the departmental dose-optimization program which includes automated exposure control, adjustment of the mA and/or kV according to patient size and/or use of iterative reconstruction technique. CONTRAST:  29m OMNIPAQUE IOHEXOL 350 MG/ML SOLN COMPARISON:  CT abdomen and pelvis 05/04/2022 FINDINGS: Lower chest: No acute abnormality. Hepatobiliary: No focal liver abnormality is seen. No gallstones, gallbladder wall thickening, or biliary dilatation. Pancreas: Unremarkable. Unchanged prominence of the main pancreatic duct. Spleen: Normal in size without focal abnormality. Adrenals/Urinary Tract: Normal adrenal glands. No urinary calculi or hydronephrosis. Unremarkable bladder. Stomach/Bowel: Postoperative change of small-bowel resection with anastomosis in the right hemipelvis. 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 The previous bowel wall thickening of the small bowel and duodenum has nearly resolved. Normal caliber large and small bowel.  Vascular/Lymphatic: Aortic atherosclerosis. No enlarged abdominal or pelvic lymph nodes. Reproductive: Status post hysterectomy. No adnexal masses. Other: No free intraperitoneal air. Musculoskeletal: Decreased soft tissue thickening and adjacent edema within the midline anterior subcutaneous fat along the incision. There is a new 2.7 x 1.4 cm  fluid collection along the left paramidline midline incision (3/42). Decreased edema and fascial thickening about the rectus abdominis muscles compatible with improving postoperative change. No acute osseous abnormality.  Chronic T9 compression fracture. IMPRESSION: 1. There is a new 2.7 cm fluid collection along the left aspect of the incision which could represent a developing abscess. Otherwise decreased inflammatory change about the anterior abdominal wall midline incision since 05/04/2022. 2. 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 The previous bowel wall thickening has nearly resolved. Electronically Signed   By: Placido Sou M.D.   On: 05/18/2022 17:55    Anti-infectives: Anti-infectives (From admission, onward)    Start     Dose/Rate Route Frequency Ordered Stop   05/19/22 1700  vancomycin (VANCOREADY) IVPB 750 mg/150 mL        750 mg 150 mL/hr over 60 Minutes Intravenous Every 24 hours 05/18/22 1703     05/18/22 1630  piperacillin-tazobactam (ZOSYN) IVPB 3.375 g        3.375 g 100 mL/hr over 30 Minutes Intravenous  Once 05/18/22 1625 05/18/22 1806   05/18/22 1630  vancomycin (VANCOCIN) IVPB 1000 mg/200 mL premix        1,000 mg 200 mL/hr over 60 Minutes Intravenous  Once 05/18/22 1628 05/18/22 2022        Assessment/Plan S/p exlap/SBR for SBO with stricture 04/19/22   S/p takeback and retention suture closure of abdomen 04/24/22 for wound dehiscence - Dr. Grandville Silos Now with SSI and abscess - retention sutures have been in place for over 3 weeks, removed today and purulent fluid expressed from abscess - continue IV abx   FEN: started CM diet ID: [email or web address] VTE: okay for chemical ppx from surgical standpoint    LOS: 0 days   Winferd [name], [facility] 05/19/2022, 1:11 PM Please see Amion for pager number during day hours  7:00am-4:30pm

## 2022-05-19 NOTE — ED Notes (Signed)
Pt stated that she wants to be discharged, and started walking out w belongings in her hand. '@1610'$  Pt just went out the ambulance w this RN. The pt is not redirectable at this time.The pt had a cigaret and came back to the ED  w her daughter who is a Education officer, museum works here. '@1620'$  MD Eleonore Chiquito made aware via secure chat who stated that he'll come down here to talk to the pt.

## 2022-05-20 ENCOUNTER — Ambulatory Visit: Payer: Medicare Other | Admitting: Family Medicine

## 2022-05-20 DIAGNOSIS — J449 Chronic obstructive pulmonary disease, unspecified: Secondary | ICD-10-CM | POA: Diagnosis not present

## 2022-05-20 DIAGNOSIS — T8141XA Infection following a procedure, superficial incisional surgical site, initial encounter: Secondary | ICD-10-CM | POA: Diagnosis not present

## 2022-05-20 DIAGNOSIS — T8131XA Disruption of external operation (surgical) wound, not elsewhere classified, initial encounter: Secondary | ICD-10-CM | POA: Diagnosis not present

## 2022-05-20 DIAGNOSIS — E1140 Type 2 diabetes mellitus with diabetic neuropathy, unspecified: Secondary | ICD-10-CM | POA: Diagnosis not present

## 2022-05-22 DIAGNOSIS — E1140 Type 2 diabetes mellitus with diabetic neuropathy, unspecified: Secondary | ICD-10-CM | POA: Diagnosis not present

## 2022-05-22 DIAGNOSIS — T8131XA Disruption of external operation (surgical) wound, not elsewhere classified, initial encounter: Secondary | ICD-10-CM | POA: Diagnosis not present

## 2022-05-22 DIAGNOSIS — J449 Chronic obstructive pulmonary disease, unspecified: Secondary | ICD-10-CM | POA: Diagnosis not present

## 2022-05-22 DIAGNOSIS — T8141XA Infection following a procedure, superficial incisional surgical site, initial encounter: Secondary | ICD-10-CM | POA: Diagnosis not present

## 2022-05-25 DIAGNOSIS — J449 Chronic obstructive pulmonary disease, unspecified: Secondary | ICD-10-CM | POA: Diagnosis not present

## 2022-05-26 ENCOUNTER — Encounter (HOSPITAL_COMMUNITY): Payer: Self-pay | Admitting: Emergency Medicine

## 2022-05-26 ENCOUNTER — Emergency Department (HOSPITAL_COMMUNITY)
Admission: EM | Admit: 2022-05-26 | Discharge: 2022-05-28 | Disposition: A | Discharge status: Home / Self Care | Payer: Medicare Other | Attending: Emergency Medicine | Admitting: Emergency Medicine

## 2022-05-26 DIAGNOSIS — F1994 Other psychoactive substance use, unspecified with psychoactive substance-induced mood disorder: Secondary | ICD-10-CM | POA: Diagnosis not present

## 2022-05-26 DIAGNOSIS — F250 Schizoaffective disorder, bipolar type: Secondary | ICD-10-CM | POA: Insufficient documentation

## 2022-05-26 DIAGNOSIS — E039 Hypothyroidism, unspecified: Secondary | ICD-10-CM | POA: Diagnosis not present

## 2022-05-26 DIAGNOSIS — R45850 Homicidal ideations: Secondary | ICD-10-CM | POA: Insufficient documentation

## 2022-05-26 DIAGNOSIS — F1414 Cocaine abuse with cocaine-induced mood disorder: Secondary | ICD-10-CM | POA: Diagnosis not present

## 2022-05-26 DIAGNOSIS — S42202A Unspecified fracture of upper end of left humerus, initial encounter for closed fracture: Secondary | ICD-10-CM | POA: Diagnosis not present

## 2022-05-26 DIAGNOSIS — I700 Atherosclerosis of aorta: Secondary | ICD-10-CM | POA: Diagnosis not present

## 2022-05-26 DIAGNOSIS — R456 Violent behavior: Secondary | ICD-10-CM | POA: Diagnosis present

## 2022-05-26 DIAGNOSIS — S42252A Displaced fracture of greater tuberosity of left humerus, initial encounter for closed fracture: Secondary | ICD-10-CM | POA: Diagnosis not present

## 2022-05-26 DIAGNOSIS — R45851 Suicidal ideations: Secondary | ICD-10-CM | POA: Diagnosis not present

## 2022-05-26 DIAGNOSIS — I10 Essential (primary) hypertension: Secondary | ICD-10-CM | POA: Diagnosis not present

## 2022-05-26 DIAGNOSIS — Z79899 Other long term (current) drug therapy: Secondary | ICD-10-CM | POA: Insufficient documentation

## 2022-05-26 DIAGNOSIS — F17210 Nicotine dependence, cigarettes, uncomplicated: Secondary | ICD-10-CM | POA: Insufficient documentation

## 2022-05-26 DIAGNOSIS — E1140 Type 2 diabetes mellitus with diabetic neuropathy, unspecified: Secondary | ICD-10-CM | POA: Insufficient documentation

## 2022-05-26 DIAGNOSIS — Z20822 Contact with and (suspected) exposure to covid-19: Secondary | ICD-10-CM | POA: Insufficient documentation

## 2022-05-26 DIAGNOSIS — J449 Chronic obstructive pulmonary disease, unspecified: Secondary | ICD-10-CM | POA: Insufficient documentation

## 2022-05-26 DIAGNOSIS — F419 Anxiety disorder, unspecified: Secondary | ICD-10-CM | POA: Diagnosis not present

## 2022-05-26 DIAGNOSIS — F259 Schizoaffective disorder, unspecified: Secondary | ICD-10-CM | POA: Diagnosis present

## 2022-05-26 DIAGNOSIS — M25512 Pain in left shoulder: Secondary | ICD-10-CM | POA: Diagnosis not present

## 2022-05-26 DIAGNOSIS — F1910 Other psychoactive substance abuse, uncomplicated: Secondary | ICD-10-CM | POA: Diagnosis present

## 2022-05-26 LAB — COMPREHENSIVE METABOLIC PANEL
ALT: 16 U/L (ref 0–44)
AST: 21 U/L (ref 15–41)
Albumin: 3.2 g/dL — ABNORMAL LOW (ref 3.5–5.0)
Alkaline Phosphatase: 78 U/L (ref 38–126)
Anion gap: 12 (ref 5–15)
BUN: 7 mg/dL (ref 6–20)
CO2: 18 mmol/L — ABNORMAL LOW (ref 22–32)
Calcium: 8.3 mg/dL — ABNORMAL LOW (ref 8.9–10.3)
Chloride: 105 mmol/L (ref 98–111)
Creatinine, Ser: 0.64 mg/dL (ref 0.44–1.00)
GFR, Estimated: >60 mL/min (ref >60)
Glucose, Bld: 117 mg/dL — ABNORMAL HIGH (ref 70–99)
Potassium: 4 mmol/L (ref 3.5–5.1)
Sodium: 135 mmol/L (ref 135–145)
Total Bilirubin: 0.2 mg/dL — ABNORMAL LOW (ref 0.3–1.2)
Total Protein: 6.5 g/dL (ref 6.5–8.1)

## 2022-05-26 LAB — I-STAT BETA HCG BLOOD, ED (MC, WL, AP ONLY): I-stat hCG, quantitative: <5 m[IU]/mL (ref <5)

## 2022-05-26 MED ORDER — ZIPRASIDONE MESYLATE 20 MG IM SOLR: 20.0000 mg | INTRAMUSCULAR | Status: DC | PRN

## 2022-05-26 MED ORDER — LORAZEPAM 2 MG/ML IJ SOLN
2.0000 mg | Freq: Once | INTRAMUSCULAR | Status: AC
  Administered 2022-05-26: 2 mg via INTRAMUSCULAR

## 2022-05-26 MED ORDER — HALOPERIDOL LACTATE 5 MG/ML IJ SOLN
10.0000 mg | Freq: Once | INTRAMUSCULAR | Status: AC
  Administered 2022-05-26: 10 mg via INTRAMUSCULAR
  Filled 2022-05-26: qty 2

## 2022-05-26 MED ORDER — LORAZEPAM 1 MG PO TABS: 1.0000 mg | ORAL_TABLET | ORAL | Status: DC | PRN

## 2022-05-26 MED ORDER — LORAZEPAM 2 MG/ML IJ SOLN
2.0000 mg | Freq: Once | INTRAMUSCULAR | Status: DC
  Filled 2022-05-26: qty 1

## 2022-05-26 MED ORDER — DIPHENHYDRAMINE HCL 50 MG/ML IJ SOLN
50.0000 mg | Freq: Once | INTRAMUSCULAR | Status: AC
  Administered 2022-05-26: 50 mg via INTRAMUSCULAR
  Filled 2022-05-26: qty 1

## 2022-05-26 MED ORDER — RISPERIDONE 1 MG PO TBDP: 2.0000 mg | ORAL_TABLET | Freq: Three times a day (TID) | ORAL | Status: DC | PRN

## 2022-05-26 MED ORDER — SODIUM CHLORIDE 0.9 % IV BOLUS
2000.0000 mL | Freq: Once | INTRAVENOUS | Status: AC
  Administered 2022-05-26: 2000 mL via INTRAVENOUS

## 2022-05-26 NOTE — ED Triage Notes (Signed)
Pt BIB by [employer] for combative behavior under IVC. Pt currently spitting into the floor and not allowing staff to provide care. Pt states "you better not touch me I am very intelligent".

## 2022-05-26 NOTE — ED Provider Notes (Signed)
[facility] EMERGENCY DEPARTMENT Provider Note   CSN: [id number] Arrival date & time: 05/26/22  2123     History  Chief Complaint  Patient presents with   Aggressive Behavior    [place] is a 56 y.o. female bib [employer] under IVC. Patient has a history of COPD, schizoaffective disorder bipolar type.  She was most recently admitted for postop wound infection.  Patient apparently been extremely agitated and angry all day.  She smoked crack earlier today.  She was threatening to kill both her mother and brother and according to [employer] "destroyed the house."  She was throwing glass and other objects that everyone in the house.  [employer] have been out to the house several times today.  Patient continues to be angry, agitated.  She spit at front desk staff on the way in.  She is currently under involuntary commitment.  She told her daughter and [employer] that she was good to kill herself after they left.  She is threatening to kill others in her household.  She has both suicidal and homicidal.  The history is provided by the police and the patient.       Home Medications Prior to Admission medications   Medication Sig Start Date End Date Taking? Authorizing Provider  acetaminophen (TYLENOL) 500 MG tablet Take 2 tablets (1,000 mg total) by mouth every 6 (six) hours as needed for mild pain. 04/29/22   Geradine Girt, DO  albuterol (PROAIR HFA) 108 (90 Base) MCG/ACT inhaler Inhale 2 puffs into the lungs every 6 (six) hours as needed for wheezing or shortness of breath. 11/16/17   Lindell Spar I, NP  amoxicillin-clavulanate (AUGMENTIN) 875-125 MG tablet Take 1 tablet by mouth 2 (two) times daily. 05/19/22   Oswald Hillock, MD  bisacodyl (DULCOLAX) 10 MG suppository Place 1 suppository (10 mg total) rectally daily as needed for moderate constipation. Patient not taking: Reported on 05/19/2022 04/29/22   Geradine Girt, DO  docusate sodium (COLACE) 100 MG capsule Take 1 capsule (100 mg total) by  mouth 2 (two) times daily. Patient taking differently: Take 100 mg by mouth daily as needed for mild constipation. 04/29/22   Geradine Girt, DO  folic acid (FOLVITE) 1 MG tablet Take 1 tablet (1 mg total) by mouth daily. Patient not taking: Reported on 05/19/2022 02/12/22   Elodia Florence., MD  ipratropium-albuterol (DUONEB) 0.5-2.5 (3) MG/3ML SOLN Take 3 mLs by nebulization every 6 (six) hours as needed. Patient not taking: Reported on 05/19/2022 02/12/22   Elodia Florence., MD  methocarbamol (ROBAXIN) 750 MG tablet Take 1 tablet (750 mg total) by mouth every 6 (six) hours as needed for muscle spasms. 04/29/22   Geradine Girt, DO  naloxone [facility]) nasal spray 4 mg/0.1 mL Place 1 spray into the nose once. Patient not taking: Reported on 05/19/2022 04/29/22   [provider]  nicotine (NICODERM CQ - DOSED IN MG/24 HOURS) 21 mg/24hr patch Place 1 patch (21 mg total) onto the skin daily. Patient not taking: Reported on 05/19/2022 04/30/22   Geradine Girt, DO  ondansetron (ZOFRAN) 4 MG tablet Take 1 tablet (4 mg total) by mouth every 6 (six) hours as needed for nausea. 04/29/22   Geradine Girt, DO  traMADol (ULTRAM) 50 MG tablet Take 1 tablet (50 mg total) by mouth every 12 (twelve) hours as needed for severe pain (pain not relieved by oxycodone). 05/19/22   Oswald Hillock, MD  Allergies    Iron dextran and Aspirin    Review of Systems   Review of Systems  Physical Exam Updated Vital Signs Ht '5\' 2"'$  (1.575 m)   Wt 43.1 kg   LMP 01/08/2011   BMI 17.38 kg/m  Physical Exam Vitals and nursing note reviewed. Exam conducted with a chaperone present.  Constitutional:      Appearance: She is not ill-appearing.  HENT:     Head: Normocephalic.     Nose: Nose normal.     Mouth/Throat:     Mouth: Mucous membranes are moist.  Eyes:     Extraocular Movements: Extraocular movements intact.     Pupils: Pupils are equal, round, and reactive to light.  Pulmonary:      Effort: Pulmonary effort is normal.     Breath sounds: Normal breath sounds.  Abdominal:     General: There is no distension.  Musculoskeletal:        General: No deformity.  Neurological:     General: No focal deficit present.     Mental Status: She is alert.  Psychiatric:        Mood and Affect: Mood is anxious. Affect is angry.        Speech: Speech is rapid and pressured.        Behavior: Behavior is agitated, aggressive and hyperactive.        Thought Content: Thought content includes homicidal and suicidal ideation.        Judgment: Judgment is impulsive.     ED Results / Procedures / Treatments   Labs (all labs ordered are listed, but only abnormal results are displayed) Labs Reviewed  COMPREHENSIVE METABOLIC PANEL  ETHANOL  RAPID URINE DRUG SCREEN, HOSP PERFORMED  CBC WITH DIFFERENTIAL/PLATELET  SALICYLATE LEVEL  ACETAMINOPHEN LEVEL  I-STAT BETA HCG BLOOD, ED ([facility], [facility], [facility] ONLY)    EKG None  Radiology No results found.  Procedures Procedures    Medications Ordered in ED Medications  LORazepam (ATIVAN) injection 2 mg (has no administration in time range)  haloperidol lactate (HALDOL) injection 10 mg (10 mg Intramuscular Given 05/26/22 2140)  diphenhydrAMINE (BENADRYL) injection 50 mg (50 mg Intramuscular Given 05/26/22 2139)    ED Course/ Medical Decision Making/ A&P Clinical Course as of 05/26/22 2326  Mon May 26, 2022  2243 EKG 12-Lead Patient was not interpreted patient's EKG.  She has regular rate and rhythm, T waves are elevated there are some ST segment abnormalities but appears the same as her previous EKG.  QT segment within normal limits [AH]  2305 Patient is currently hypotensive after medications.  WIll give fluids [AH]  2306 Patient blood pressure markedly low.  I evaluated the patient is currently sleeping.  She has strong radial pulses bilaterally.  Her hands are warm and well-perfused.  Her upper extremities are very thin and I have suspicion  that her cuff size may be too large.  She also has a very narrow pulse pressure which makes me also concerned that this may be slightly inaccurate.  Patient is getting a pressure bag 2 L of fluid currently. [AH]    Clinical Course User Index [AH] Margarita Mail, PA-C                           Medical Decision Making Amount and/or Complexity of Data Reviewed Labs: ordered. ECG/medicine tests:  Decision-making details documented in ED Course.  Risk Prescription drug management.   Patient blood pressure  is improving . Sign out given to Dr. Dina Rich.        Final Clinical Impression(s) / ED Diagnoses Final diagnoses:  None    Rx / DC Orders ED Discharge Orders     None         Margarita Mail, PA-C 05/26/22 2335    Deno Etienne, DO 05/29/22 [phone]

## 2022-05-26 NOTE — ED Notes (Signed)
IVC paperwork Completed, 4 copies made, 1 in med rec, original in red folder, others to green zone Network engineer.

## 2022-05-26 NOTE — ED Notes (Signed)
Pt walking around in room with no pants on. Refusing to sit in bed.

## 2022-05-27 ENCOUNTER — Emergency Department (HOSPITAL_COMMUNITY): Payer: Medicare Other

## 2022-05-27 DIAGNOSIS — F250 Schizoaffective disorder, bipolar type: Secondary | ICD-10-CM | POA: Diagnosis not present

## 2022-05-27 DIAGNOSIS — I700 Atherosclerosis of aorta: Secondary | ICD-10-CM | POA: Diagnosis not present

## 2022-05-27 DIAGNOSIS — M25512 Pain in left shoulder: Secondary | ICD-10-CM | POA: Diagnosis not present

## 2022-05-27 DIAGNOSIS — S42252A Displaced fracture of greater tuberosity of left humerus, initial encounter for closed fracture: Secondary | ICD-10-CM | POA: Diagnosis not present

## 2022-05-27 DIAGNOSIS — F1994 Other psychoactive substance use, unspecified with psychoactive substance-induced mood disorder: Secondary | ICD-10-CM

## 2022-05-27 LAB — CBC WITH DIFFERENTIAL/PLATELET
Abs Immature Granulocytes: 0.04 10*3/uL (ref 0.00–0.07)
Basophils Absolute: 0.1 10*3/uL (ref 0.0–0.1)
Basophils Relative: 1 %
Eosinophils Absolute: 0.3 10*3/uL (ref 0.0–0.5)
Eosinophils Relative: 3 %
HCT: 39.8 % (ref 36.0–46.0)
Hemoglobin: 12.8 g/dL (ref 12.0–15.0)
Immature Granulocytes: 1 %
Lymphocytes Relative: 23 %
Lymphs Abs: 2 10*3/uL (ref 0.7–4.0)
MCH: 29.2 pg (ref 26.0–34.0)
MCHC: 32.2 g/dL (ref 30.0–36.0)
MCV: 90.9 fL (ref 80.0–100.0)
Monocytes Absolute: 0.6 10*3/uL (ref 0.1–1.0)
Monocytes Relative: 7 %
Neutro Abs: 5.7 10*3/uL (ref 1.7–7.7)
Neutrophils Relative %: 65 %
Platelets: 229 10*3/uL (ref 150–400)
RBC: 4.38 MIL/uL (ref 3.87–5.11)
RDW: 16.8 % — ABNORMAL HIGH (ref 11.5–15.5)
WBC: 8.6 10*3/uL (ref 4.0–10.5)
nRBC: 0 % (ref 0.0–0.2)

## 2022-05-27 LAB — RAPID URINE DRUG SCREEN, HOSP PERFORMED
Amphetamines: NOT DETECTED
Barbiturates: NOT DETECTED
Benzodiazepines: NOT DETECTED
Cocaine: POSITIVE — AB
Opiates: NOT DETECTED
Tetrahydrocannabinol: NOT DETECTED

## 2022-05-27 LAB — SALICYLATE LEVEL: Salicylate Lvl: <7 mg/dL — ABNORMAL LOW (ref 7.0–30.0)

## 2022-05-27 LAB — ACETAMINOPHEN LEVEL: Acetaminophen (Tylenol), Serum: <10 ug/mL — ABNORMAL LOW (ref 10–30)

## 2022-05-27 LAB — ETHANOL: Alcohol, Ethyl (B): <10 mg/dL (ref <10)

## 2022-05-27 MED ORDER — ACETAMINOPHEN 325 MG PO TABS
650.0000 mg | ORAL_TABLET | Freq: Four times a day (QID) | ORAL | Status: DC | PRN
  Administered 2022-05-27 – 2022-05-28 (×2): 650 mg via ORAL
  Filled 2022-05-27 (×2): qty 2

## 2022-05-27 MED ORDER — PALIPERIDONE ER 3 MG PO TB24
3.0000 mg | ORAL_TABLET | Freq: Every day | ORAL | Status: DC
  Administered 2022-05-27 – 2022-05-28 (×2): 3 mg via ORAL
  Filled 2022-05-27 (×4): qty 1

## 2022-05-27 NOTE — ED Notes (Signed)
Pt transferred to purple zone room 49, without any difficulty on a wheelchair. Pt still complaining of pain on left shoulder

## 2022-05-27 NOTE — ED Notes (Signed)
Pt resting, rise and fall of chest noted.

## 2022-05-27 NOTE — ED Notes (Signed)
Patient belongings moved to purple zone. Patient belongings in small locker 1

## 2022-05-27 NOTE — ED Notes (Signed)
Pt given a drink for snack.

## 2022-05-27 NOTE — ED Notes (Signed)
Pt stated they were thirsty, Probation officer provided pt with a diet coke

## 2022-05-27 NOTE — ED Notes (Signed)
Pt talking to psychiatry now

## 2022-05-27 NOTE — ED Notes (Signed)
Pt's lunch has arrived, pt sitting up and eating her lunch

## 2022-05-27 NOTE — Progress Notes (Signed)
Pt was accepted to [facility] 05/28/2022; Bed assignment: [facility]  Pt meets inpatient criteria per Vesta Mixer, NP.  Attending Physician will be Dr. Reece Levy, MD  Report can be called to: - [phone]  Pt can arrive after Loiza, NP, D'Erika Varenhorst, RN, Alethia Berthold, MD, Lynnda Shields, RN, and [address] [place], [place]  05/27/2022 3:22 PM

## 2022-05-27 NOTE — Progress Notes (Addendum)
Per Vesta Mixer, NP, pt meets criteria for inpatient behavioral health placement. CSW requested Dr. Weber Cooks review pt for [facility] placement. CSW sent referrals to out of network providers.  Destination Service Provider Address Phone Fax  [facility]  [address], [place] [address] [phone] [facility]  [place], [place] [address] [phone] [place]  [address] [place] [address] [phone] [facility]  [place], [place] [address] [phone]  [facility]  [address] [place] [address] [place]  [facility]  [address] [place] [address] [phone] [place]  [place], [place] [address] [phone]  [facility]  [place] [address], [place] [address] [phone]  [facility]  [address] [place] [address] [phone]  [facility]  [address] [place] [address] [phone] [facility]  [address] [place] [address] [phone]  [place], [place] [address] [phone] [place], [place]  05/27/2022 1:01 PM

## 2022-05-27 NOTE — ED Notes (Signed)
Pt woke up from nap and requested to use the br, pt walked to the br w/o any difficulty. Writer noticed pt was sweating and warm to touch, RN notified. Vitals taken, pt back in bed.

## 2022-05-27 NOTE — ED Notes (Signed)
IVC paperwork in purple zone

## 2022-05-27 NOTE — Progress Notes (Signed)
Orthopedic Tech Progress Note Patient Details:  [name] [birth year] [id number]  Ortho Devices Type of Ortho Device: Arm sling Ortho Device/Splint Location: LUE Ortho Device/Splint Interventions: Ordered, Application, Adjustment   Post Interventions Patient Tolerated: Well, Fair Instructions Provided: Care of device  Janit Pagan 05/27/2022, 2:52 PM

## 2022-05-27 NOTE — ED Notes (Signed)
Pt complaints of left shoulder pain, asking for pain meds.  Message sent to Dr. Pearline Cables.
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
## 2022-05-27 NOTE — BH Assessment (Signed)
TTS clinician attempted to completed assessment. Roselie Awkward, RN, patient is currently in hallway bed. No private room room for TTS to complete assessment.

## 2022-05-27 NOTE — ED Notes (Addendum)
Pt's breakfast has arrived, pt still sleeping. Writer left breakfast tray on nurses station  0820: Pt awoken from sleep, Probation officer provided pt with her breakfast. Pt pulled up in bed and eating her breakfast. Pt complaining of pain on left arm.

## 2022-05-27 NOTE — ED Notes (Signed)
Attempted blood draw, unsuccessful. 

## 2022-05-27 NOTE — ED Notes (Signed)
Pt walked to the br w/o any difficulty.

## 2022-05-27 NOTE — ED Provider Notes (Signed)
Patient rested comfortably overnight.  Labs reviewed and largely unremarkable.  Blood pressure improved after sedating medications.  Most recent blood pressure 226 systolic.  Patient is medically cleared for TTS evaluation.  Physical Exam  BP (!) 87/61 (BP Location: Left Arm)   Pulse 74   Temp (!) 97.1 F (36.2 C) (Axillary)   Resp 17   Ht 1.575 m ('5\' 2"'$ )   Wt 43.1 kg   LMP 01/08/2011   SpO2 92%   BMI 17.38 kg/m   Physical Exam  Procedures  Procedures  ED Course / MDM   Clinical Course as of 05/27/22 3335  [facility] May 26, 2022  2243 EKG 12-Lead Patient was not interpreted patient's EKG.  She has regular rate and rhythm, T waves are elevated there are some ST segment abnormalities but appears the same as her previous EKG.  QT segment within normal limits [AH]  2305 Patient is currently hypotensive after medications.  WIll give fluids [AH]  2306 Patient blood pressure markedly low.  I evaluated the patient is currently sleeping.  She has strong radial pulses bilaterally.  Her hands are warm and well-perfused.  Her upper extremities are very thin and I have suspicion that her cuff size may be too large.  She also has a very narrow pulse pressure which makes me also concerned that this may be slightly inaccurate.  Patient is getting a pressure bag 2 L of fluid currently. [AH]    Clinical Course User Index [AH] Margarita Mail, PA-C   Medical Decision Making Amount and/or Complexity of Data Reviewed Labs: ordered. ECG/medicine tests:  Decision-making details documented in ED Course.  Risk Prescription drug management.          [name], MD 05/27/22 [phone]

## 2022-05-28 DIAGNOSIS — F250 Schizoaffective disorder, bipolar type: Secondary | ICD-10-CM | POA: Diagnosis not present

## 2022-05-28 LAB — RESP PANEL BY RT-PCR (FLU A&B, COVID) ARPGX2
Influenza A by PCR: NEGATIVE
Influenza B by PCR: NEGATIVE
SARS Coronavirus 2 by RT PCR: NEGATIVE

## 2022-05-28 MED ORDER — IBUPROFEN 800 MG PO TABS
800.0000 mg | ORAL_TABLET | Freq: Once | ORAL | Status: AC
  Administered 2022-05-28: 800 mg via ORAL
  Filled 2022-05-28: qty 1

## 2022-05-28 NOTE — ED Provider Notes (Signed)
Emergency Medicine Observation Re-evaluation Note  [name] is a 56 y.o. female, seen on rounds today.  Pt initially presented to the ED for complaints of Aggressive Behavior Currently, the patient is psychiatric admission  Physical Exam  BP 113/78 (BP Location: Right Arm)   Pulse (!) 102   Temp 98.1 F (36.7 C)   Resp 18   Ht '5\' 2"'$  (1.575 m)   Wt 43.1 kg   LMP 01/08/2011   SpO2 93%   BMI 17.38 kg/m  Physical Exam Patient alert no acute distress  ED Course / MDM  EKG:EKG Interpretation  Date/Time:  Monday May 26 2022 22:36:59 EST Ventricular Rate:  76 PR Interval:  134 QRS Duration: 88 QT Interval:  416 QTC Calculation: 468 R Axis:   56 Text Interpretation: Sinus rhythm Probable left atrial enlargement Minimal ST elevation, inferior leads Confirmed by Thayer Jew [phone]) on 05/27/2022 2:36:27 AM  I have reviewed the labs performed to date as well as medications administered while in observation.  Recent changes in the last 24 hours include none.  Plan  Current plan is for psychiatric admission on December 6.    Milton Ferguson, MD 05/28/22 1044

## 2022-05-28 NOTE — ED Notes (Signed)
Sheriff called for transport  

## 2022-05-28 NOTE — ED Notes (Signed)
PT needs COVID swab before transport

## 2022-05-28 NOTE — ED Notes (Signed)
2nd call to [facility] ,report give.

## 2022-06-09 ENCOUNTER — Telehealth: Payer: Self-pay

## 2022-06-09 NOTE — Telephone Encounter (Signed)
        Patient  visited [facility] on 12/6     Telephone encounter attempt :  1st  A HIPAA compliant voice message was left requesting a return call.  Instructed patient to call back     [facility], [facility]  [phone] [address], [place], [place] [address] Phone: [phone] Email: [email or web address]

## 2022-06-10 ENCOUNTER — Telehealth: Payer: Self-pay

## 2022-06-10 NOTE — Telephone Encounter (Signed)
     Patient  visit on 12/6  at [facility]   Have you been able to follow up with your primary care physician? Yes   The patient was or was not able to obtain any needed medicine or equipment. Yes   Are there diet recommendations that you are having difficulty following? Na   Patient expresses understanding of discharge instructions and education provided has no other needs at this time.  Yes      [place], [facility], Care Management  [phone] [address], [place], [place] [address] Phone: [phone] Email: [email or web address]

## 2022-06-11 ENCOUNTER — Ambulatory Visit (HOSPITAL_COMMUNITY)
Admission: EM | Admit: 2022-06-11 | Discharge: 2022-06-11 | Discharge status: Against Medical Advice | Payer: Medicare Other

## 2022-06-11 ENCOUNTER — Telehealth (HOSPITAL_COMMUNITY): Payer: Self-pay

## 2022-06-12 DIAGNOSIS — J449 Chronic obstructive pulmonary disease, unspecified: Secondary | ICD-10-CM | POA: Diagnosis not present

## 2022-06-15 ENCOUNTER — Other Ambulatory Visit: Payer: Self-pay

## 2022-06-15 ENCOUNTER — Inpatient Hospital Stay (HOSPITAL_COMMUNITY)
Admission: EM | Admit: 2022-06-15 | Discharge: 2022-06-20 | DRG: 918 | Discharge status: Against Medical Advice | Payer: Medicare Other | Attending: Internal Medicine | Admitting: Internal Medicine

## 2022-06-15 ENCOUNTER — Encounter (HOSPITAL_COMMUNITY): Payer: Self-pay

## 2022-06-15 ENCOUNTER — Emergency Department (HOSPITAL_COMMUNITY): Payer: Medicare Other

## 2022-06-15 DIAGNOSIS — E785 Hyperlipidemia, unspecified: Secondary | ICD-10-CM | POA: Diagnosis not present

## 2022-06-15 DIAGNOSIS — F17210 Nicotine dependence, cigarettes, uncomplicated: Secondary | ICD-10-CM | POA: Diagnosis not present

## 2022-06-15 DIAGNOSIS — D500 Iron deficiency anemia secondary to blood loss (chronic): Secondary | ICD-10-CM | POA: Diagnosis present

## 2022-06-15 DIAGNOSIS — K219 Gastro-esophageal reflux disease without esophagitis: Secondary | ICD-10-CM | POA: Diagnosis present

## 2022-06-15 DIAGNOSIS — R9431 Abnormal electrocardiogram [ECG] [EKG]: Secondary | ICD-10-CM | POA: Diagnosis not present

## 2022-06-15 DIAGNOSIS — T50902A Poisoning by unspecified drugs, medicaments and biological substances, intentional self-harm, initial encounter: Principal | ICD-10-CM

## 2022-06-15 DIAGNOSIS — E11319 Type 2 diabetes mellitus with unspecified diabetic retinopathy without macular edema: Secondary | ICD-10-CM | POA: Diagnosis present

## 2022-06-15 DIAGNOSIS — G4700 Insomnia, unspecified: Secondary | ICD-10-CM | POA: Diagnosis not present

## 2022-06-15 DIAGNOSIS — T391X2A Poisoning by 4-Aminophenol derivatives, intentional self-harm, initial encounter: Secondary | ICD-10-CM | POA: Diagnosis not present

## 2022-06-15 DIAGNOSIS — L89311 Pressure ulcer of right buttock, stage 1: Secondary | ICD-10-CM | POA: Diagnosis present

## 2022-06-15 DIAGNOSIS — I9589 Other hypotension: Secondary | ICD-10-CM | POA: Diagnosis present

## 2022-06-15 DIAGNOSIS — E1140 Type 2 diabetes mellitus with diabetic neuropathy, unspecified: Secondary | ICD-10-CM | POA: Diagnosis present

## 2022-06-15 DIAGNOSIS — D649 Anemia, unspecified: Secondary | ICD-10-CM | POA: Diagnosis not present

## 2022-06-15 DIAGNOSIS — I4891 Unspecified atrial fibrillation: Secondary | ICD-10-CM | POA: Diagnosis not present

## 2022-06-15 DIAGNOSIS — F1410 Cocaine abuse, uncomplicated: Secondary | ICD-10-CM | POA: Diagnosis present

## 2022-06-15 DIAGNOSIS — Z5329 Procedure and treatment not carried out because of patient's decision for other reasons: Secondary | ICD-10-CM | POA: Diagnosis present

## 2022-06-15 DIAGNOSIS — E861 Hypovolemia: Secondary | ICD-10-CM | POA: Diagnosis present

## 2022-06-15 DIAGNOSIS — Z886 Allergy status to analgesic agent status: Secondary | ICD-10-CM

## 2022-06-15 DIAGNOSIS — I10 Essential (primary) hypertension: Secondary | ICD-10-CM | POA: Diagnosis present

## 2022-06-15 DIAGNOSIS — M79603 Pain in arm, unspecified: Secondary | ICD-10-CM | POA: Diagnosis not present

## 2022-06-15 DIAGNOSIS — R531 Weakness: Secondary | ICD-10-CM | POA: Diagnosis not present

## 2022-06-15 DIAGNOSIS — E7220 Disorder of urea cycle metabolism, unspecified: Secondary | ICD-10-CM | POA: Diagnosis present

## 2022-06-15 DIAGNOSIS — T50901A Poisoning by unspecified drugs, medicaments and biological substances, accidental (unintentional), initial encounter: Secondary | ICD-10-CM | POA: Diagnosis present

## 2022-06-15 DIAGNOSIS — Z825 Family history of asthma and other chronic lower respiratory diseases: Secondary | ICD-10-CM

## 2022-06-15 DIAGNOSIS — Z79899 Other long term (current) drug therapy: Secondary | ICD-10-CM

## 2022-06-15 DIAGNOSIS — L8990 Pressure ulcer of unspecified site, unspecified stage: Secondary | ICD-10-CM | POA: Insufficient documentation

## 2022-06-15 DIAGNOSIS — Z1152 Encounter for screening for COVID-19: Secondary | ICD-10-CM | POA: Diagnosis not present

## 2022-06-15 DIAGNOSIS — Z9884 Bariatric surgery status: Secondary | ICD-10-CM

## 2022-06-15 DIAGNOSIS — S42252D Displaced fracture of greater tuberosity of left humerus, subsequent encounter for fracture with routine healing: Secondary | ICD-10-CM

## 2022-06-15 DIAGNOSIS — E1165 Type 2 diabetes mellitus with hyperglycemia: Secondary | ICD-10-CM | POA: Diagnosis present

## 2022-06-15 DIAGNOSIS — E874 Mixed disorder of acid-base balance: Secondary | ICD-10-CM | POA: Diagnosis present

## 2022-06-15 DIAGNOSIS — I700 Atherosclerosis of aorta: Secondary | ICD-10-CM | POA: Diagnosis not present

## 2022-06-15 DIAGNOSIS — Z8249 Family history of ischemic heart disease and other diseases of the circulatory system: Secondary | ICD-10-CM

## 2022-06-15 DIAGNOSIS — Z888 Allergy status to other drugs, medicaments and biological substances status: Secondary | ICD-10-CM

## 2022-06-15 DIAGNOSIS — R7401 Elevation of levels of liver transaminase levels: Secondary | ICD-10-CM | POA: Diagnosis not present

## 2022-06-15 DIAGNOSIS — K7110 Toxic liver disease with hepatic necrosis, without coma: Secondary | ICD-10-CM | POA: Diagnosis present

## 2022-06-15 DIAGNOSIS — R109 Unspecified abdominal pain: Secondary | ICD-10-CM | POA: Diagnosis not present

## 2022-06-15 DIAGNOSIS — E039 Hypothyroidism, unspecified: Secondary | ICD-10-CM | POA: Diagnosis present

## 2022-06-15 DIAGNOSIS — F250 Schizoaffective disorder, bipolar type: Secondary | ICD-10-CM | POA: Diagnosis present

## 2022-06-15 DIAGNOSIS — I959 Hypotension, unspecified: Secondary | ICD-10-CM | POA: Diagnosis not present

## 2022-06-15 DIAGNOSIS — J449 Chronic obstructive pulmonary disease, unspecified: Secondary | ICD-10-CM | POA: Diagnosis not present

## 2022-06-15 DIAGNOSIS — Z833 Family history of diabetes mellitus: Secondary | ICD-10-CM

## 2022-06-15 LAB — CBC WITH DIFFERENTIAL/PLATELET
Abs Immature Granulocytes: 0.04 10*3/uL (ref 0.00–0.07)
Basophils Absolute: 0 10*3/uL (ref 0.0–0.1)
Basophils Relative: 0 %
Eosinophils Absolute: 0 10*3/uL (ref 0.0–0.5)
Eosinophils Relative: 0 %
HCT: 39 % (ref 36.0–46.0)
Hemoglobin: 12.8 g/dL (ref 12.0–15.0)
Immature Granulocytes: 0 %
Lymphocytes Relative: 7 %
Lymphs Abs: 0.9 10*3/uL (ref 0.7–4.0)
MCH: 30.8 pg (ref 26.0–34.0)
MCHC: 32.8 g/dL (ref 30.0–36.0)
MCV: 94 fL (ref 80.0–100.0)
Monocytes Absolute: 0.5 10*3/uL (ref 0.1–1.0)
Monocytes Relative: 4 %
Neutro Abs: 11.2 10*3/uL — ABNORMAL HIGH (ref 1.7–7.7)
Neutrophils Relative %: 89 %
Platelets: 163 10*3/uL (ref 150–400)
RBC: 4.15 MIL/uL (ref 3.87–5.11)
RDW: 16.4 % — ABNORMAL HIGH (ref 11.5–15.5)
WBC: 12.6 10*3/uL — ABNORMAL HIGH (ref 4.0–10.5)
nRBC: 0 % (ref 0.0–0.2)

## 2022-06-15 LAB — COMPREHENSIVE METABOLIC PANEL
ALT: 501 U/L — ABNORMAL HIGH (ref 0–44)
AST: 1794 U/L — ABNORMAL HIGH (ref 15–41)
Albumin: 3.5 g/dL (ref 3.5–5.0)
Alkaline Phosphatase: 106 U/L (ref 38–126)
Anion gap: 15 (ref 5–15)
BUN: 22 mg/dL — ABNORMAL HIGH (ref 6–20)
CO2: 18 mmol/L — ABNORMAL LOW (ref 22–32)
Calcium: 8.3 mg/dL — ABNORMAL LOW (ref 8.9–10.3)
Chloride: 103 mmol/L (ref 98–111)
Creatinine, Ser: 1.04 mg/dL — ABNORMAL HIGH (ref 0.44–1.00)
GFR, Estimated: >60 mL/min (ref >60)
Glucose, Bld: 109 mg/dL — ABNORMAL HIGH (ref 70–99)
Potassium: 4.4 mmol/L (ref 3.5–5.1)
Sodium: 136 mmol/L (ref 135–145)
Total Bilirubin: 1.2 mg/dL (ref 0.3–1.2)
Total Protein: 6.3 g/dL — ABNORMAL LOW (ref 6.5–8.1)

## 2022-06-15 LAB — LACTIC ACID, PLASMA: Lactic Acid, Venous: 1.8 mmol/L (ref 0.5–1.9)

## 2022-06-15 LAB — ETHANOL: Alcohol, Ethyl (B): <10 mg/dL (ref <10)

## 2022-06-15 LAB — LIPASE, BLOOD: Lipase: 39 U/L (ref 11–51)

## 2022-06-15 LAB — MAGNESIUM: Magnesium: 2 mg/dL (ref 1.7–2.4)

## 2022-06-15 LAB — ACETAMINOPHEN LEVEL: Acetaminophen (Tylenol), Serum: 14 ug/mL (ref 10–30)

## 2022-06-15 LAB — SALICYLATE LEVEL: Salicylate Lvl: <7 mg/dL — ABNORMAL LOW (ref 7.0–30.0)

## 2022-06-15 MED ORDER — METOCLOPRAMIDE HCL 5 MG/ML IJ SOLN
10.0000 mg | Freq: Once | INTRAMUSCULAR | Status: AC
  Administered 2022-06-15: 10 mg via INTRAVENOUS
  Filled 2022-06-15: qty 2

## 2022-06-15 MED ORDER — IOHEXOL 350 MG/ML SOLN
75.0000 mL | Freq: Once | INTRAVENOUS | Status: AC | PRN
  Administered 2022-06-15: 75 mL via INTRAVENOUS

## 2022-06-15 MED ORDER — DEXTROSE 5 % IV SOLN
15.0000 mg/kg/h | INTRAVENOUS | Status: DC
  Administered 2022-06-16 – 2022-06-19 (×4): 15 mg/kg/h via INTRAVENOUS
  Filled 2022-06-15 (×4): qty 90

## 2022-06-15 MED ORDER — PANTOPRAZOLE SODIUM 40 MG IV SOLR
40.0000 mg | Freq: Once | INTRAVENOUS | Status: AC
  Administered 2022-06-15: 40 mg via INTRAVENOUS
  Filled 2022-06-15: qty 10

## 2022-06-15 MED ORDER — ACETYLCYSTEINE LOAD VIA INFUSION
150.0000 mg/kg | Freq: Once | INTRAVENOUS | Status: AC
  Administered 2022-06-16: 6465 mg via INTRAVENOUS
  Filled 2022-06-15: qty 212

## 2022-06-15 MED ORDER — LACTATED RINGERS IV BOLUS
1000.0000 mL | Freq: Once | INTRAVENOUS | Status: AC
  Administered 2022-06-15: 1000 mL via INTRAVENOUS

## 2022-06-15 MED ORDER — LACTATED RINGERS IV SOLN: INTRAVENOUS | Status: DC

## 2022-06-15 MED ORDER — FENTANYL CITRATE PF 50 MCG/ML IJ SOSY
50.0000 ug | PREFILLED_SYRINGE | Freq: Once | INTRAMUSCULAR | Status: AC
  Administered 2022-06-15: 50 ug via INTRAVENOUS
  Filled 2022-06-15: qty 1

## 2022-06-15 MED ORDER — ONDANSETRON HCL 4 MG/2ML IJ SOLN
4.0000 mg | Freq: Once | INTRAMUSCULAR | Status: AC
  Administered 2022-06-15: 4 mg via INTRAVENOUS
  Filled 2022-06-15: qty 2

## 2022-06-15 NOTE — ED Provider Notes (Signed)
[facility] EMERGENCY DEPARTMENT Provider Note   CSN: [id number] Arrival date & time: 06/15/22  1932     History  Chief Complaint  Patient presents with   Drug Overdose    [name] is a 56 y.o. female.  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 Lupita Dawn, readmitted 10/13-10/21 for SBO left AMA return to hospital 10/26 through 11/7 and underwent lysis of adhesions small bowel resection 04/18/2022 and had retention sutures placed on 11/12 who is presenting today due to complaints of abdominal pain and intentional overdose.  Patient gives very little history about [name] was able to give a more detailed history of the day.  She reports that the patient owns [name] own home and [name] brother rents a room for [name].  He heard a thump early this morning and found [name] on the floor but was not sure if she had a seizure.  He told [name] that he was going to call 911 but she left before they got there.  White called [name] and she was walking along [address] this morning and a family member picked [name] up and took [name] to their home.  She was then later back at [name] home and [name] brother was there and she called out to him telling him that he needed to call 911 because she took a bunch of pills.  It is unclear exactly what she took.  [name] daughter has possession of all of [name] prescription medications and reports there are none in the house except for some antibiotics that she had had while she was previously hospitalized but she does not have [name] prescription medications otherwise.  She is reporting abdominal pain but denies any vomiting, fever, hematemesis.  She has not had any diarrhea.  She did report that she drank alcohol today but cannot give a timeframe.  She does not know  when she took the medications.  She did admit to taking Tylenol.  She has not had a fever and denies any shortness of breath or chest pain.   The history is provided by the patient, medical records and a relative.  Drug Overdose       Home Medications Prior to Admission medications   Medication Sig Start Date End Date Taking? Authorizing Provider  acetaminophen (TYLENOL) 500 MG tablet Take 2 tablets (1,000 mg total) by mouth every 6 (six) hours as needed for mild pain. Patient not taking: Reported on 05/27/2022 04/29/22   Geradine Girt, DO  albuterol [facility] HFA) [phone] Base) MCG/ACT inhaler Inhale 2 puffs into the lungs every 6 (six) hours as needed for wheezing or shortness of breath. Patient not taking: Reported on 05/27/2022 11/16/17   Lindell Spar I, NP  amoxicillin-clavulanate (AUGMENTIN) 875-125 MG tablet Take 1 tablet by mouth 2 (two) times daily. Patient not taking: Reported on 05/27/2022 05/19/22   Oswald Hillock, MD  bisacodyl (DULCOLAX) 10 MG suppository Place 1 suppository (10 mg total) rectally daily as needed for moderate constipation. Patient not taking: Reported on 05/19/2022 04/29/22   Geradine Girt, DO  docusate sodium (COLACE) 100 MG capsule Take 1 capsule (100 mg total) by mouth 2 (two) times daily. Patient not taking: Reported on 05/27/2022 04/29/22   Geradine Girt, DO  folic acid (FOLVITE) 1 MG tablet Take  1 tablet (1 mg total) by mouth daily. Patient not taking: Reported on 05/19/2022 02/12/22   Elodia Florence., MD  ipratropium-albuterol (DUONEB) 0.5-2.5 (3) MG/3ML SOLN Take 3 mLs by nebulization every 6 (six) hours as needed. Patient not taking: Reported on 05/19/2022 02/12/22   Elodia Florence., MD  methocarbamol (ROBAXIN) 750 MG tablet Take 1 tablet (750 mg total) by mouth every 6 (six) hours as needed for muscle spasms. Patient not taking: Reported on 05/27/2022 04/29/22   Geradine Girt, DO  naloxone [facility]) nasal spray 4 mg/0.1 mL Place 1 spray into  the nose once. 04/29/22   [provider]  nicotine (NICODERM CQ - DOSED IN MG/24 HOURS) 21 mg/24hr patch Place 1 patch (21 mg total) onto the skin daily. 04/30/22   Geradine Girt, DO  ondansetron (ZOFRAN) 4 MG tablet Take 1 tablet (4 mg total) by mouth every 6 (six) hours as needed for nausea. Patient not taking: Reported on 05/27/2022 04/29/22   Geradine Girt, DO  traMADol (ULTRAM) 50 MG tablet Take 1 tablet (50 mg total) by mouth every 12 (twelve) hours as needed for severe pain (pain not relieved by oxycodone). Patient not taking: Reported on 05/27/2022 05/19/22   Oswald Hillock, MD      Allergies    Iron dextran and Aspirin    Review of Systems   Review of Systems  Physical Exam Updated Vital Signs BP (!) 98/55   Pulse 89   Temp 97.9 F (36.6 C)   Resp 15   Ht '5\' 2"'$  (1.575 m)   Wt 43.1 kg   LMP 01/08/2011   SpO2 92%   BMI 17.38 kg/m  Physical Exam Vitals and nursing note reviewed.  Constitutional:      General: She is not in acute distress.    Appearance: She is well-developed. She is ill-appearing.     Comments: Sleepy but wakes up and answers questions but guarded  HENT:     Head: Normocephalic and atraumatic.  Eyes:     Pupils: Pupils are equal, round, and reactive to light.  Cardiovascular:     Rate and Rhythm: Normal rate and regular rhythm.     Heart sounds: Normal heart sounds. No murmur heard.    No friction rub.  Pulmonary:     Effort: Pulmonary effort is normal.     Breath sounds: Normal breath sounds. No wheezing or rales.  Abdominal:     General: Bowel sounds are normal. There is no distension.     Palpations: Abdomen is soft.     Tenderness: There is abdominal tenderness in the right upper quadrant, epigastric area, periumbilical area and left upper quadrant. There is no guarding or rebound.  Musculoskeletal:        General: No tenderness. Normal range of motion.     Right lower leg: No edema.     Left lower leg: No edema.     Comments: No  edema  Skin:    General: Skin is warm and dry.     Findings: No rash.  Neurological:     Cranial Nerves: No cranial nerve deficit.     Sensory: No sensory deficit.     Motor: No weakness.     Comments: Oriented to person and place  Psychiatric:     Comments: Patient did admit at 1 point that she did this to hurt herself.  Then she reports she was not trying to hurt herself.  She told EMS  that they could just let [name] die.  Daughter reports that she has been on a downward spiral and has recently started reusing cocaine     ED Results / Procedures / Treatments   Labs (all labs ordered are listed, but only abnormal results are displayed) Labs Reviewed  CBC WITH DIFFERENTIAL/PLATELET - Abnormal; Notable for the following components:      Result Value   WBC 12.6 (*)    RDW 16.4 (*)    Neutro Abs 11.2 (*)    All other components within normal limits  COMPREHENSIVE METABOLIC PANEL - Abnormal; Notable for the following components:   CO2 18 (*)    Glucose, Bld 109 (*)    BUN 22 (*)    Creatinine, Ser 1.04 (*)    Calcium 8.3 (*)    Total Protein 6.3 (*)    AST 1,794 (*)    ALT 501 (*)    All other components within normal limits  SALICYLATE LEVEL - Abnormal; Notable for the following components:   Salicylate Lvl <2.0 (*)    All other components within normal limits  LACTIC ACID, PLASMA  LIPASE, BLOOD  MAGNESIUM  ACETAMINOPHEN LEVEL  ETHANOL  LACTIC ACID, PLASMA    EKG EKG Interpretation  Date/Time:  Sunday June 15 2022 19:34:12 EST Ventricular Rate:  84 PR Interval:  136 QRS Duration: 96 QT Interval:  396 QTC Calculation: 469 R Axis:   51 Text Interpretation: Sinus rhythm Probable left atrial enlargement ST elevation, consider inferior injury No significant change since last tracing Confirmed by Blanchie Dessert [phone]) on 06/15/2022 8:22:47 PM  Radiology CT ABDOMEN PELVIS W CONTRAST  Result Date: 06/15/2022 CLINICAL DATA:  Abdominal pain EXAM: CT ABDOMEN AND  PELVIS WITH CONTRAST TECHNIQUE: Multidetector CT imaging of the abdomen and pelvis was performed using the standard protocol following bolus administration of intravenous contrast. RADIATION DOSE REDUCTION: This exam was performed according to the departmental dose-optimization program which includes automated exposure control, adjustment of the mA and/or kV according to patient size and/or use of iterative reconstruction technique. CONTRAST:  65m OMNIPAQUE IOHEXOL 350 MG/ML SOLN COMPARISON:  05/18/2022 FINDINGS: Lower chest: No acute pleural or parenchymal lung disease. Hepatobiliary: No focal liver abnormality is seen. No gallstones, gallbladder wall thickening, or biliary dilatation. Pancreas: Unremarkable. No pancreatic ductal dilatation or surrounding inflammatory changes. Spleen: Normal in size without focal abnormality. Adrenals/Urinary Tract: Adrenal glands are unremarkable. Kidneys are normal, without renal calculi, focal lesion, or hydronephrosis. Bladder is unremarkable. 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 No bowel obstruction or ileus. There is mild mural thickening within distal small bowel in the right lower quadrant, consistent with inflammatory or infectious enteritis. Vascular/Lymphatic: Aortic atherosclerosis. No enlarged abdominal or pelvic lymph nodes. Reproductive: Status post hysterectomy. No adnexal masses. Other: No free fluid or free intraperitoneal gas. No abdominal wall hernia. The left para umbilical abdominal wall fluid collection seen previously has resolved in the interim. Musculoskeletal: No acute or destructive bony lesions. Chronic T9 compression deformity. Reconstructed images demonstrate no additional findings. IMPRESSION: 1. Mild wall thickening of the distal small bowel in the right lower quadrant, compatible with inflammatory or infectious enteritis. No bowel obstruction or ileus. 2.  Aortic Atherosclerosis (ICD10-I70.0). Electronically Signed    By: MRanda NgoM.D.   On: 06/15/2022 23:38    Procedures Procedures    Medications Ordered in ED Medications  lactated ringers infusion ( Intravenous New Bag/Given 06/15/22 2153)  acetylcysteine (ACETADOTE) 30.5 mg/mL load via infusion 6,465 mg (has no administration  in time range)    Followed by  acetylcysteine (ACETADOTE) 18,000 mg in dextrose 5 % 590 mL (30.5085 mg/mL) infusion (has no administration in time range)  lactated ringers bolus 1,000 mL (0 mLs Intravenous Stopped 06/15/22 2146)  ondansetron (ZOFRAN) injection 4 mg (4 mg Intravenous Given 06/15/22 2104)  pantoprazole (PROTONIX) injection 40 mg (40 mg Intravenous Given 06/15/22 2104)  metoCLOPramide (REGLAN) injection 10 mg (10 mg Intravenous Given 06/15/22 2157)  fentaNYL (SUBLIMAZE) injection 50 mcg (50 mcg Intravenous Given 06/15/22 2244)  lactated ringers bolus 1,000 mL (0 mLs Intravenous Stopped 06/16/22 0011)  iohexol (OMNIPAQUE) 350 MG/ML injection 75 mL (75 mLs Intravenous Contrast Given 06/15/22 2321)    ED Course/ Medical Decision Making/ A&P                           Medical Decision Making Amount and/or Complexity of Data Reviewed Labs: ordered.  Risk Prescription drug management.   Pt with multiple medical problems and comorbidities and presenting today with a complaint that caries a high risk for morbidity and mortality.  Here today with concern for intentional overdose.  She is complaining abdominal pain and is found to be hypotensive.  Speaking with patient's daughter she does not have access to [name] prescription medications but is unclear what she took.  However patient did admit to taking Tylenol.  She also has a significant history of complications after gastric bypass recent hospitalization in mid November for fluid collection requiring antibiotics.  [name] daughter reports that she has been eating and does not have excessive vomiting but does frequently complain of abdominal pain.  Patient received a  liter of IV fluid with initial improvement in blood pressure to the 90s.  Maps have been greater than 65 throughout but pressures will go between 80s and 90s.  I independently interpreted patient's EKG and labs.  EKG shows no significant change, will lactate, lipase, magnesium are all within normal limits.  Salicylates were normal.  Acetaminophen level was 14 today and CMP with new transaminitis with an AST of 1790 and a LT of 500 with normal bilirubin.  Anion gap of 15.  CBC with mild leukocytosis of 12 but normal hemoglobin and platelet count.  Alcohol level was negative.  I have independently visualized and interpreted pt's images today.  CT of the abdomen pelvis today shows no evidence of obstruction.  Radiology reports mild wall thickening of the distal small bowel in the right lower quadrant compatible with inflammatory or infectious enteritis.  No obstruction or ileus today.  Discussed case with poison control.  At this time given the story will start patient on NAC therapy.  Will admit patient to the hospital for further care.  In the future patient will need psychiatric evaluation once she is medically clear.  Will consult to critical care.  CRITICAL CARE Performed by: Gera Inboden Total critical care time: 30 minutes Critical care time was exclusive of separately billable procedures and treating other patients. Critical care was necessary to treat or prevent imminent or life-threatening deterioration. Critical care was time spent personally by me on the following activities: development of treatment plan with patient and/or surrogate as well as nursing, discussions with consultants, evaluation of patient's response to treatment, examination of patient, obtaining history from patient or surrogate, ordering and performing treatments and interventions, ordering and review of laboratory studies, ordering and review of radiographic studies, pulse oximetry and re-evaluation of patient's  condition.  Final Clinical Impression(s) / ED Diagnoses Final diagnoses:  Intentional overdose, initial encounter ([place])  Transaminitis  Intentional acetaminophen overdose, initial encounter [facility])    Rx / DC Orders ED Discharge Orders     None         Blanchie Dessert, MD 06/16/22 0013

## 2022-06-15 NOTE — ED Notes (Signed)
Daughter [name] [phone] would like an update asap

## 2022-06-15 NOTE — ED Triage Notes (Signed)
Pt BIB [facility] w/ a drug overdose. Brother called EMS concerned for pt due to her not answering the phone. Pt has a drug abuse hx and psych hx. Pt was found laying on the floor when EMS arrived and very lethargic. Pt states she took a bunch of pills and drank a 40 oz beer.   EMS VS BP 78/40 P 78 O2 97% RA CBG 130

## 2022-06-15 NOTE — ED Provider Notes (Incomplete)
[place] EMERGENCY DEPARTMENT Provider Note   CSN: [id number] Arrival date & time: 06/15/22  1932     History {Add pertinent medical, surgical, social history, OB history to HPI:1} Chief Complaint  Patient presents with  . Drug Overdose    [name] is a 56 y.o. female.  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 Lupita Dawn, readmitted 10/13-10/21 for SBO left AMA return to hospital 10/26 through 11/7 and underwent lysis of adhesions small bowel resection 04/18/2022 and had retention sutures placed on 11/12 who is presenting today due to complaints of abdominal pain and intentional overdose.  Patient gives very little history about her daughter cherish was able to give a more detailed history of the day.  She reports that the patient owns her own home and her brother rents a room for her.  He heard a thump early this morning and found her on the floor but was not sure if she had a seizure.  He told her that he was going to call 911 but she left before they got there.  [name] called her and she was walking along [address] this morning and a family member picked her up and took her to their home.  She was then later back at her home and her brother was there and she called out to him telling him that he needed to call 911 because she took a bunch of pills.  It is unclear exactly what she took.  Her daughter has possession of all of her prescription medications and reports there are none in the house except for some antibiotics that she had had while she was previously hospitalized but she does not have her prescription medications otherwise.  She is reporting abdominal pain but denies any vomiting, fever, hematemesis.  She has not had any diarrhea.  She did report that she  drank alcohol today but cannot give a timeframe.  She does not know when she took the medications.  She did admit to taking Tylenol.  She has not had a fever and denies any   The history is provided by the patient, medical records and a relative.  Drug Overdose       Home Medications Prior to Admission medications   Medication Sig Start Date End Date Taking? Authorizing Provider  acetaminophen (TYLENOL) 500 MG tablet Take 2 tablets (1,000 mg total) by mouth every 6 (six) hours as needed for mild pain. Patient not taking: Reported on 05/27/2022 04/29/22   Geradine Girt, DO  albuterol [facility] HFA) 108 [phone] Base) MCG/ACT inhaler Inhale 2 puffs into the lungs every 6 (six) hours as needed for wheezing or shortness of breath. Patient not taking: Reported on 05/27/2022 11/16/17   Lindell Spar I, NP  amoxicillin-clavulanate (AUGMENTIN) 875-125 MG tablet Take 1 tablet by mouth 2 (two) times daily. Patient not taking: Reported on 05/27/2022 05/19/22   Oswald Hillock, MD  bisacodyl (DULCOLAX) 10 MG suppository Place 1 suppository (10 mg total) rectally daily as needed for moderate constipation. Patient not taking: Reported on 05/19/2022 04/29/22   Geradine Girt, DO  docusate sodium (COLACE) 100 MG capsule Take 1 capsule (100 mg total) by mouth 2 (two) times daily. Patient not taking: Reported on 05/27/2022 04/29/22   Geradine Girt, DO  folic acid (FOLVITE) 1  MG tablet Take 1 tablet (1 mg total) by mouth daily. Patient not taking: Reported on 05/19/2022 02/12/22   Elodia Florence., MD  ipratropium-albuterol (DUONEB) 0.5-2.5 (3) MG/3ML SOLN Take 3 mLs by nebulization every 6 (six) hours as needed. Patient not taking: Reported on 05/19/2022 02/12/22   Elodia Florence., MD  methocarbamol (ROBAXIN) 750 MG tablet Take 1 tablet (750 mg total) by mouth every 6 (six) hours as needed for muscle spasms. Patient not taking: Reported on 05/27/2022 04/29/22   Geradine Girt, DO  naloxone [facility] nasal  spray 4 mg/0.1 mL Place 1 spray into the nose once. 04/29/22   [provider]  nicotine (NICODERM CQ - DOSED IN MG/24 HOURS) 21 mg/24hr patch Place 1 patch (21 mg total) onto the skin daily. 04/30/22   Geradine Girt, DO  ondansetron (ZOFRAN) 4 MG tablet Take 1 tablet (4 mg total) by mouth every 6 (six) hours as needed for nausea. Patient not taking: Reported on 05/27/2022 04/29/22   Geradine Girt, DO  traMADol (ULTRAM) 50 MG tablet Take 1 tablet (50 mg total) by mouth every 12 (twelve) hours as needed for severe pain (pain not relieved by oxycodone). Patient not taking: Reported on 05/27/2022 05/19/22   Oswald Hillock, MD      Allergies    Iron dextran and Aspirin    Review of Systems   Review of Systems  Physical Exam Updated Vital Signs BP 95/60   Pulse 86   Temp 98.1 F (36.7 C) (Oral)   Resp (!) 22   LMP 01/08/2011   SpO2 96%  Physical Exam  ED Results / Procedures / Treatments   Labs (all labs ordered are listed, but only abnormal results are displayed) Labs Reviewed  CBC WITH DIFFERENTIAL/PLATELET - Abnormal; Notable for the following components:      Result Value   WBC 12.6 (*)    RDW 16.4 (*)    Neutro Abs 11.2 (*)    All other components within normal limits  COMPREHENSIVE METABOLIC PANEL - Abnormal; Notable for the following components:   CO2 18 (*)    Glucose, Bld 109 (*)    BUN 22 (*)    Creatinine, Ser 1.04 (*)    Calcium 8.3 (*)    Total Protein 6.3 (*)    AST 1,794 (*)    ALT 501 (*)    All other components within normal limits  SALICYLATE LEVEL - Abnormal; Notable for the following components:   Salicylate Lvl <8.4 (*)    All other components within normal limits  LACTIC ACID, PLASMA  LIPASE, BLOOD  MAGNESIUM  ACETAMINOPHEN LEVEL  ETHANOL  LACTIC ACID, PLASMA    EKG EKG Interpretation  Date/Time:  Sunday June 15 2022 19:34:12 EST Ventricular Rate:  84 PR Interval:  136 QRS Duration: 96 QT Interval:  396 QTC  Calculation: 469 R Axis:   51 Text Interpretation: Sinus rhythm Probable left atrial enlargement ST elevation, consider inferior injury No significant change since last tracing Confirmed by Blanchie Dessert [id number]) on 06/15/2022 8:22:47 PM  Radiology No results found.  Procedures Procedures  {Document cardiac monitor, telemetry assessment procedure when appropriate:1}  Medications Ordered in ED Medications  lactated ringers infusion ( Intravenous New Bag/Given 06/15/22 2153)  fentaNYL (SUBLIMAZE) injection 50 mcg (has no administration in time range)  lactated ringers bolus 1,000 mL (0 mLs Intravenous Stopped 06/15/22 2146)  ondansetron (ZOFRAN) injection 4 mg (4 mg Intravenous Given 06/15/22 2104)  pantoprazole (PROTONIX)  injection 40 mg (40 mg Intravenous Given 06/15/22 2104)  metoCLOPramide (REGLAN) injection 10 mg (10 mg Intravenous Given 06/15/22 2157)    ED Course/ Medical Decision Making/ A&P                           Medical Decision Making Amount and/or Complexity of Data Reviewed Labs: ordered.  Risk Prescription drug management.   ***  {Document critical care time when appropriate:1} {Document review of labs and clinical decision tools ie heart score, Chads2Vasc2 etc:1}  {Document your independent review of radiology images, and any outside records:1} {Document your discussion with family members, caretakers, and with consultants:1} {Document social determinants of health affecting pt's care:1} {Document your decision making why or why not admission, treatments were needed:1} Final Clinical Impression(s) / ED Diagnoses Final diagnoses:  None    Rx / DC Orders ED Discharge Orders     None

## 2022-06-16 ENCOUNTER — Emergency Department (HOSPITAL_COMMUNITY): Payer: Medicare Other

## 2022-06-16 ENCOUNTER — Inpatient Hospital Stay (HOSPITAL_COMMUNITY): Payer: Medicare Other

## 2022-06-16 DIAGNOSIS — J449 Chronic obstructive pulmonary disease, unspecified: Secondary | ICD-10-CM | POA: Diagnosis not present

## 2022-06-16 DIAGNOSIS — D500 Iron deficiency anemia secondary to blood loss (chronic): Secondary | ICD-10-CM | POA: Diagnosis not present

## 2022-06-16 DIAGNOSIS — D649 Anemia, unspecified: Secondary | ICD-10-CM | POA: Diagnosis not present

## 2022-06-16 DIAGNOSIS — E874 Mixed disorder of acid-base balance: Secondary | ICD-10-CM | POA: Diagnosis not present

## 2022-06-16 DIAGNOSIS — L8990 Pressure ulcer of unspecified site, unspecified stage: Secondary | ICD-10-CM | POA: Insufficient documentation

## 2022-06-16 DIAGNOSIS — F250 Schizoaffective disorder, bipolar type: Secondary | ICD-10-CM | POA: Diagnosis present

## 2022-06-16 DIAGNOSIS — Z1152 Encounter for screening for COVID-19: Secondary | ICD-10-CM | POA: Diagnosis not present

## 2022-06-16 DIAGNOSIS — T50901A Poisoning by unspecified drugs, medicaments and biological substances, accidental (unintentional), initial encounter: Secondary | ICD-10-CM | POA: Diagnosis present

## 2022-06-16 DIAGNOSIS — E861 Hypovolemia: Secondary | ICD-10-CM | POA: Diagnosis present

## 2022-06-16 DIAGNOSIS — R7401 Elevation of levels of liver transaminase levels: Secondary | ICD-10-CM | POA: Diagnosis not present

## 2022-06-16 DIAGNOSIS — R945 Abnormal results of liver function studies: Secondary | ICD-10-CM | POA: Diagnosis not present

## 2022-06-16 DIAGNOSIS — L89311 Pressure ulcer of right buttock, stage 1: Secondary | ICD-10-CM | POA: Diagnosis not present

## 2022-06-16 DIAGNOSIS — E1140 Type 2 diabetes mellitus with diabetic neuropathy, unspecified: Secondary | ICD-10-CM | POA: Diagnosis not present

## 2022-06-16 DIAGNOSIS — F1410 Cocaine abuse, uncomplicated: Secondary | ICD-10-CM | POA: Diagnosis present

## 2022-06-16 DIAGNOSIS — E039 Hypothyroidism, unspecified: Secondary | ICD-10-CM | POA: Diagnosis present

## 2022-06-16 DIAGNOSIS — E7220 Disorder of urea cycle metabolism, unspecified: Secondary | ICD-10-CM | POA: Diagnosis not present

## 2022-06-16 DIAGNOSIS — Z9884 Bariatric surgery status: Secondary | ICD-10-CM | POA: Diagnosis not present

## 2022-06-16 DIAGNOSIS — E785 Hyperlipidemia, unspecified: Secondary | ICD-10-CM | POA: Diagnosis present

## 2022-06-16 DIAGNOSIS — S42252D Displaced fracture of greater tuberosity of left humerus, subsequent encounter for fracture with routine healing: Secondary | ICD-10-CM | POA: Diagnosis not present

## 2022-06-16 DIAGNOSIS — M25512 Pain in left shoulder: Secondary | ICD-10-CM | POA: Diagnosis not present

## 2022-06-16 DIAGNOSIS — E1165 Type 2 diabetes mellitus with hyperglycemia: Secondary | ICD-10-CM | POA: Diagnosis present

## 2022-06-16 DIAGNOSIS — K7110 Toxic liver disease with hepatic necrosis, without coma: Secondary | ICD-10-CM | POA: Diagnosis not present

## 2022-06-16 DIAGNOSIS — T391X2A Poisoning by 4-Aminophenol derivatives, intentional self-harm, initial encounter: Secondary | ICD-10-CM | POA: Diagnosis present

## 2022-06-16 DIAGNOSIS — R748 Abnormal levels of other serum enzymes: Secondary | ICD-10-CM | POA: Diagnosis not present

## 2022-06-16 DIAGNOSIS — F17210 Nicotine dependence, cigarettes, uncomplicated: Secondary | ICD-10-CM | POA: Diagnosis present

## 2022-06-16 DIAGNOSIS — I4891 Unspecified atrial fibrillation: Secondary | ICD-10-CM | POA: Diagnosis present

## 2022-06-16 DIAGNOSIS — I10 Essential (primary) hypertension: Secondary | ICD-10-CM | POA: Diagnosis present

## 2022-06-16 DIAGNOSIS — K219 Gastro-esophageal reflux disease without esophagitis: Secondary | ICD-10-CM | POA: Diagnosis present

## 2022-06-16 DIAGNOSIS — G4700 Insomnia, unspecified: Secondary | ICD-10-CM | POA: Diagnosis not present

## 2022-06-16 DIAGNOSIS — E11319 Type 2 diabetes mellitus with unspecified diabetic retinopathy without macular edema: Secondary | ICD-10-CM | POA: Diagnosis not present

## 2022-06-16 DIAGNOSIS — Z5329 Procedure and treatment not carried out because of patient's decision for other reasons: Secondary | ICD-10-CM | POA: Diagnosis present

## 2022-06-16 DIAGNOSIS — K7290 Hepatic failure, unspecified without coma: Secondary | ICD-10-CM | POA: Diagnosis not present

## 2022-06-16 DIAGNOSIS — T50902A Poisoning by unspecified drugs, medicaments and biological substances, intentional self-harm, initial encounter: Secondary | ICD-10-CM | POA: Diagnosis not present

## 2022-06-16 DIAGNOSIS — I9589 Other hypotension: Secondary | ICD-10-CM | POA: Diagnosis not present

## 2022-06-16 LAB — RAPID URINE DRUG SCREEN, HOSP PERFORMED
Amphetamines: NOT DETECTED
Barbiturates: NOT DETECTED
Benzodiazepines: NOT DETECTED
Cocaine: POSITIVE — AB
Opiates: NOT DETECTED
Tetrahydrocannabinol: NOT DETECTED

## 2022-06-16 LAB — CBC
HCT: 38.1 % (ref 36.0–46.0)
Hemoglobin: 12.8 g/dL (ref 12.0–15.0)
MCH: 30.8 pg (ref 26.0–34.0)
MCHC: 33.6 g/dL (ref 30.0–36.0)
MCV: 91.8 fL (ref 80.0–100.0)
Platelets: 192 10*3/uL (ref 150–400)
RBC: 4.15 MIL/uL (ref 3.87–5.11)
RDW: 16.5 % — ABNORMAL HIGH (ref 11.5–15.5)
WBC: 9.6 10*3/uL (ref 4.0–10.5)
nRBC: 0 % (ref 0.0–0.2)

## 2022-06-16 LAB — POCT I-STAT 7, (LYTES, BLD GAS, ICA,H+H)
Acid-base deficit: 8 mmol/L — ABNORMAL HIGH (ref 0.0–2.0)
Acid-base deficit: 11 mmol/L — ABNORMAL HIGH (ref 0.0–2.0)
Bicarbonate: 19.2 mmol/L — ABNORMAL LOW (ref 20.0–28.0)
Bicarbonate: 15.5 mmol/L — ABNORMAL LOW (ref 20.0–28.0)
Calcium, Ion: 1.11 mmol/L — ABNORMAL LOW (ref 1.15–1.40)
Calcium, Ion: 1.26 mmol/L (ref 1.15–1.40)
HCT: 35 % — ABNORMAL LOW (ref 36.0–46.0)
HCT: 34 % — ABNORMAL LOW (ref 36.0–46.0)
Hemoglobin: 11.9 g/dL — ABNORMAL LOW (ref 12.0–15.0)
Hemoglobin: 11.6 g/dL — ABNORMAL LOW (ref 12.0–15.0)
O2 Saturation: 98 %
O2 Saturation: 91 %
Patient temperature: 98.4
Potassium: 3.6 mmol/L (ref 3.5–5.1)
Potassium: 3.4 mmol/L — ABNORMAL LOW (ref 3.5–5.1)
Sodium: 137 mmol/L (ref 135–145)
Sodium: 138 mmol/L (ref 135–145)
TCO2: 21 mmol/L — ABNORMAL LOW (ref 22–32)
TCO2: 17 mmol/L — ABNORMAL LOW (ref 22–32)
pCO2 arterial: 44 mmHg (ref 32–48)
pCO2 arterial: 37.2 mmHg (ref 32–48)
pH, Arterial: 7.247 — ABNORMAL LOW (ref 7.35–7.45)
pH, Arterial: 7.229 — ABNORMAL LOW (ref 7.35–7.45)
pO2, Arterial: 128 mmHg — ABNORMAL HIGH (ref 83–108)
pO2, Arterial: 71 mmHg — ABNORMAL LOW (ref 83–108)

## 2022-06-16 LAB — PROTIME-INR
INR: 1.2 (ref 0.8–1.2)
INR: 1.5 — ABNORMAL HIGH (ref 0.8–1.2)
Prothrombin Time: 14.9 seconds (ref 11.4–15.2)
Prothrombin Time: 18 seconds — ABNORMAL HIGH (ref 11.4–15.2)

## 2022-06-16 LAB — COMPREHENSIVE METABOLIC PANEL
ALT: 483 U/L — ABNORMAL HIGH (ref 0–44)
ALT: 476 U/L — ABNORMAL HIGH (ref 0–44)
AST: 1055 U/L — ABNORMAL HIGH (ref 15–41)
AST: 763 U/L — ABNORMAL HIGH (ref 15–41)
Albumin: 3.4 g/dL — ABNORMAL LOW (ref 3.5–5.0)
Albumin: 2.8 g/dL — ABNORMAL LOW (ref 3.5–5.0)
Alkaline Phosphatase: 107 U/L (ref 38–126)
Alkaline Phosphatase: 88 U/L (ref 38–126)
Anion gap: 12 (ref 5–15)
Anion gap: 8 (ref 5–15)
BUN: 21 mg/dL — ABNORMAL HIGH (ref 6–20)
BUN: 11 mg/dL (ref 6–20)
CO2: 18 mmol/L — ABNORMAL LOW (ref 22–32)
CO2: 18 mmol/L — ABNORMAL LOW (ref 22–32)
Calcium: 7.8 mg/dL — ABNORMAL LOW (ref 8.9–10.3)
Calcium: 8 mg/dL — ABNORMAL LOW (ref 8.9–10.3)
Chloride: 107 mmol/L (ref 98–111)
Chloride: 112 mmol/L — ABNORMAL HIGH (ref 98–111)
Creatinine, Ser: 0.87 mg/dL (ref 0.44–1.00)
Creatinine, Ser: 0.84 mg/dL (ref 0.44–1.00)
GFR, Estimated: >60 mL/min (ref >60)
GFR, Estimated: >60 mL/min (ref >60)
Glucose, Bld: 142 mg/dL — ABNORMAL HIGH (ref 70–99)
Glucose, Bld: 275 mg/dL — ABNORMAL HIGH (ref 70–99)
Potassium: 4.2 mmol/L (ref 3.5–5.1)
Potassium: 4.5 mmol/L (ref 3.5–5.1)
Sodium: 137 mmol/L (ref 135–145)
Sodium: 138 mmol/L (ref 135–145)
Total Bilirubin: 1.8 mg/dL — ABNORMAL HIGH (ref 0.3–1.2)
Total Bilirubin: 2 mg/dL — ABNORMAL HIGH (ref 0.3–1.2)
Total Protein: 6.8 g/dL (ref 6.5–8.1)
Total Protein: 5.4 g/dL — ABNORMAL LOW (ref 6.5–8.1)

## 2022-06-16 LAB — MAGNESIUM: Magnesium: 2 mg/dL (ref 1.7–2.4)

## 2022-06-16 LAB — LACTIC ACID, PLASMA
Lactic Acid, Venous: 1 mmol/L (ref 0.5–1.9)
Lactic Acid, Venous: 4.2 mmol/L (ref 0.5–1.9)
Lactic Acid, Venous: 1.1 mmol/L (ref 0.5–1.9)

## 2022-06-16 LAB — ACETAMINOPHEN LEVEL: Acetaminophen (Tylenol), Serum: <10 ug/mL — ABNORMAL LOW (ref 10–30)

## 2022-06-16 LAB — PHOSPHORUS: Phosphorus: 5.2 mg/dL — ABNORMAL HIGH (ref 2.5–4.6)

## 2022-06-16 LAB — AMMONIA
Ammonia: 27 umol/L (ref 9–35)
Ammonia: 48 umol/L — ABNORMAL HIGH (ref 9–35)

## 2022-06-16 LAB — GLUCOSE, CAPILLARY
Glucose-Capillary: 136 mg/dL — ABNORMAL HIGH (ref 70–99)
Glucose-Capillary: 126 mg/dL — ABNORMAL HIGH (ref 70–99)
Glucose-Capillary: 84 mg/dL (ref 70–99)

## 2022-06-16 LAB — MRSA NEXT GEN BY PCR, NASAL: MRSA by PCR Next Gen: DETECTED — AB

## 2022-06-16 MED ORDER — ORAL CARE MOUTH RINSE: 15.0000 mL | OROMUCOSAL | Status: DC | PRN

## 2022-06-16 MED ORDER — ENOXAPARIN SODIUM 40 MG/0.4ML IJ SOSY
40.0000 mg | PREFILLED_SYRINGE | INTRAMUSCULAR | Status: DC
  Administered 2022-06-16 – 2022-06-20 (×5): 40 mg via SUBCUTANEOUS
  Filled 2022-06-16 (×5): qty 0.4

## 2022-06-16 MED ORDER — FOLIC ACID 5 MG/ML IJ SOLN
1.0000 mg | Freq: Every day | INTRAMUSCULAR | Status: AC
  Administered 2022-06-16 – 2022-06-18 (×3): 1 mg via INTRAVENOUS
  Filled 2022-06-16 (×6): qty 0.2

## 2022-06-16 MED ORDER — INSULIN ASPART 100 UNIT/ML IJ SOLN
1.0000 [IU] | INTRAMUSCULAR | Status: DC
  Administered 2022-06-16 – 2022-06-17 (×2): 1 [IU] via SUBCUTANEOUS
  Administered 2022-06-17: 2 [IU] via SUBCUTANEOUS

## 2022-06-16 MED ORDER — VITAMIN K1 10 MG/ML IJ SOLN
10.0000 mg | Freq: Once | INTRAVENOUS | Status: AC
  Administered 2022-06-16: 10 mg via INTRAVENOUS
  Filled 2022-06-16: qty 1

## 2022-06-16 MED ORDER — THIAMINE HCL 100 MG/ML IJ SOLN
100.0000 mg | Freq: Every day | INTRAMUSCULAR | Status: DC
  Administered 2022-06-16 – 2022-06-17 (×2): 100 mg via INTRAVENOUS
  Filled 2022-06-16 (×2): qty 2

## 2022-06-16 MED ORDER — CALCIUM GLUCONATE-NACL 1-0.675 GM/50ML-% IV SOLN
1.0000 g | Freq: Once | INTRAVENOUS | Status: AC
  Administered 2022-06-16: 1000 mg via INTRAVENOUS
  Filled 2022-06-16: qty 50

## 2022-06-16 MED ORDER — LORAZEPAM 2 MG/ML IJ SOLN
2.0000 mg | INTRAMUSCULAR | Status: DC | PRN
  Administered 2022-06-16 – 2022-06-18 (×5): 2 mg via INTRAVENOUS
  Filled 2022-06-16 (×6): qty 1

## 2022-06-16 MED ORDER — HALOPERIDOL 1 MG PO TABS
2.0000 mg | ORAL_TABLET | Freq: Four times a day (QID) | ORAL | Status: DC | PRN
  Administered 2022-06-17 – 2022-06-18 (×2): 2 mg via ORAL
  Filled 2022-06-16 (×4): qty 2

## 2022-06-16 MED ORDER — STERILE WATER FOR INJECTION IV SOLN
INTRAVENOUS | Status: DC
  Filled 2022-06-16 (×5): qty 1000

## 2022-06-16 MED ORDER — VANCOMYCIN HCL 750 MG/150ML IV SOLN
750.0000 mg | INTRAVENOUS | Status: DC
  Administered 2022-06-16 – 2022-06-19 (×4): 750 mg via INTRAVENOUS
  Filled 2022-06-16 (×6): qty 150

## 2022-06-16 MED ORDER — ONDANSETRON HCL 4 MG/2ML IJ SOLN
4.0000 mg | Freq: Four times a day (QID) | INTRAMUSCULAR | Status: DC | PRN
  Administered 2022-06-16 – 2022-06-20 (×5): 4 mg via INTRAVENOUS
  Filled 2022-06-16 (×5): qty 2

## 2022-06-16 MED ORDER — HALOPERIDOL LACTATE 5 MG/ML IJ SOLN
2.0000 mg | Freq: Four times a day (QID) | INTRAMUSCULAR | Status: DC | PRN
  Administered 2022-06-16: 2 mg via INTRAMUSCULAR
  Filled 2022-06-16 (×2): qty 1

## 2022-06-16 MED ORDER — FENTANYL CITRATE PF 50 MCG/ML IJ SOSY
50.0000 ug | PREFILLED_SYRINGE | INTRAMUSCULAR | Status: DC | PRN
  Administered 2022-06-16 – 2022-06-20 (×17): 50 ug via INTRAVENOUS
  Filled 2022-06-16 (×18): qty 1

## 2022-06-16 MED ORDER — SODIUM CHLORIDE 0.9 % IV BOLUS
1000.0000 mL | Freq: Once | INTRAVENOUS | Status: AC
  Administered 2022-06-16: 1000 mL via INTRAVENOUS

## 2022-06-16 MED ORDER — SODIUM CHLORIDE 0.9 % IV SOLN
2.0000 g | INTRAVENOUS | Status: DC
  Administered 2022-06-16 – 2022-06-20 (×5): 2 g via INTRAVENOUS
  Filled 2022-06-16 (×5): qty 20

## 2022-06-16 MED ORDER — LACTULOSE 10 GM/15ML PO SOLN
10.0000 g | Freq: Three times a day (TID) | ORAL | Status: DC
  Administered 2022-06-16: 10 g
  Filled 2022-06-16: qty 15

## 2022-06-16 MED ORDER — LACTULOSE 10 GM/15ML PO SOLN
10.0000 g | Freq: Three times a day (TID) | ORAL | Status: DC
  Administered 2022-06-17 – 2022-06-20 (×11): 10 g via ORAL
  Filled 2022-06-16 (×11): qty 15

## 2022-06-16 MED ORDER — CHLORHEXIDINE GLUCONATE CLOTH 2 % EX PADS
6.0000 | MEDICATED_PAD | Freq: Every day | CUTANEOUS | Status: DC
  Administered 2022-06-16 – 2022-06-20 (×4): 6 via TOPICAL

## 2022-06-16 MED ORDER — POTASSIUM CHLORIDE 10 MEQ/100ML IV SOLN
10.0000 meq | INTRAVENOUS | Status: AC
  Administered 2022-06-16 (×2): 10 meq via INTRAVENOUS
  Filled 2022-06-16 (×2): qty 100

## 2022-06-16 NOTE — Progress Notes (Addendum)
Brief Pharmacy Note  Pt presenting with intentional medication overdose.  Pt would not tell EMS what meds she took but she does admit to taking APAP; APAP level 14 with transaminitis.  EDP d/w poison control and rec'd acetylcysteine, which has been initiated.  Daughter states that pt does not have free access to her prescription medications, but since it is unclear what she took PTA, the following information was determined:  [name] last talked to one of our techs on 12/5 and reported she was not taking any of her home meds, but a [facility] nurse called her recently and pt reported that she had her prescriptions filled and was taking them. This month she has had filled: Hydroxyzine '25mg'$  #60 Ibuprofen '800mg'$  #60 Paroxetine '10mg'$  #15 Quetiapine '400mg'$  #15 Tramadol '50mg'$  #10 Trazodone '50mg'$  #15  Followup labs have been ordered.  Will continue to monitor and contact poison control as needed.  Wynona Neat, PharmD, BCPS 06/16/2022 1:02 AM     Addendum: Updated labs drawn today 0403 reported to poison control [facility]):  APAP <10, INR 1.2, AST 1055 (from 7902), ALT 483 (from 501).  PC recommends rechecking LFTs and INR in 12h; further APAP levels not needed since now <10. PC states that we can consider d/c'ing NAC if LFTs continue to trend down and INR remains <2.  Labs ordered; will f/u w/ PC. VB  6:22 AM

## 2022-06-16 NOTE — Progress Notes (Signed)
ABG results given to Dr. Carlis Abbott. No new orders received at this time. RT will continue to be available as needed.

## 2022-06-16 NOTE — Progress Notes (Signed)
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 They spoke with [facility], who agreed with current management but turned down for transplant due to extensive psych history.  Repeat labs 1:30AM per poison control recs.  Julian Hy, DO 06/16/22 6:06 PM [facility]

## 2022-06-16 NOTE — Progress Notes (Signed)
Pharmacy Antibiotic Note  [name] is a 56 y.o. female admitted on 06/15/2022 with OD. Pharmacy has been consulted for vancomycin dosing with rising lactic acid and concern for sepsis. Cr stable ~0.8.  Plan: Vancomycin '750mg'$  IV q24h - est AUC 423  Height: '5\' 2"'$  (157.5 cm) Weight: 46.9 kg (103 lb 6.3 oz) IBW/kg (Calculated) : 50.1  Temp (24hrs), Avg:98.3 F (36.8 C), Min:97.9 F (36.6 C), Max:98.5 F (36.9 C)  Recent Labs  Lab 06/15/22 2118 06/16/22 0403 06/16/22 1437  WBC 12.6* 9.6  --   CREATININE 1.04* 0.87 0.84  LATICACIDVEN 1.8 1.0 4.2*    Estimated Creatinine Clearance: 54 mL/min (by C-G formula based on SCr of 0.84 mg/dL).    Allergies  Allergen Reactions   Iron Dextran Shortness Of Breath and Anxiety   Aspirin Nausea And Vomiting and Other (See Comments)    Ok to take tylenol or ibuprofen     Arrie Senate, PharmD, BCPS, [facility] Clinical Pharmacist Please check AMION for all [facility] numbers 06/16/2022

## 2022-06-16 NOTE — Consult Note (Addendum)
[facility] Face-to-Face Psychiatry Consult   Reason for Consult: Suicide attempt by overdose Referring Physician: Dr. Halford Chessman Patient Identification: [name] MRN:  [id number] Principal Diagnosis: Overdose Diagnosis:  Principal Problem:   Overdose   Total Time spent with patient: 1 hour  Subjective:   [name] is a 56 y.o. female patient admitted as a suicide attempt by overdose on Tylenol.  Patient tells meThat she has recently relapsed on cocaine and as a result she has had worsening depressive symptoms in addition to increase in suicidal thoughts.  She stated suicidal thoughts began on Saturday, increased on Sunday.  She states she took 2 of 3 bottles of Tylenol.  She reports depressive symptoms of sadness, isolation, withdrawn, worthlessness, hopelessness, guilty, insomnia, poor appetite.  She reports not eating for 2 days prior to this hospitalization, however had an increase in alcohol intake.  She reports drinking beer only.  She states her mother is currently out of town in [place], and understands her children will not be as present due to the holiday.  While she denies active suicidality, she does endorse feeling depressed and knows that she is not an her baseline.   Today upon evaluation patient is appropriate and alert. She appears to be engaging well with staff, family and Probation officer.  Her current presentation of persistent and ongoing cocaine use despite medical and psychosocial consequences (numerous hospitalizations for depression, suicidality, familial disruption), cravings to use, desire to cut down use with previous unsuccessful attempts, significant amounts of time spent using cocaine, impaired control over use, cravings, and evidence of tolerance are consistent with cocaine use disorder, severe. Additionally, while cocaine use is likely contributing to symptoms of depression, it is felt that patient also meets criteria for comorbid major depressive disorder given severity and  persistence of symptoms.  Although patient carries historical diagnosis of schizoaffective disorder, there is low suspicion for psychosis or mania at this time as patient denies history of mania or hypomania outside of active periods of substance use.  Patient endorses inconsistent adherence with medications impacted by substance use.  Will attempt to consolidate medications as below and prioritize medications for depression and substance use.  She expresses strong desire for inpatient rehab and shows insight into importance of going directly from hospital to rehab; agree with this goal if possible to minimize risk of return to use.  Patient has history of suicidal ideation,  and suicide attempt most recently in July of 2023. She denies currently and it is felt that she meets criteria for involuntary commitment at this time however she is willing to remain in the hospital voluntarily.  She does understand current recommendations of inpatient psychiatric hospitalization once medically stable.    Today, patient reports reasonable level of depression related to next steps and family's influence or opinion surrounding her most recent attempt and the holidays.  No acute safety concerns on interview. Reports sleeping well without medication.  Endorses hunger at this time, stating she has not eaten in 3 days.  Otherwise, no changes to plan of care at this time.   Currently on interview, the patient. Alert and oriented x4. Patient becomes tearful at times during interview. Patient reports presenting to the emergency department after a suicide attempt via taking 2 bottles of TYlenol  and drinking beer.  Over the past couple weeks she reports worsening anxiety, mood swings and depression, as related to cocaine use/relapse. She reports symptoms of depression including hopelessness, guilt/worthlessness, concentration difficulty, disturbed sleep pattern (sleeping 3 to 4  hours during the day) loss of interest, low energy,  decreased appetite, isolative, crying spells deterioration of ADLs and recurrent thoughts of death.  She reports symptoms of anxiety including excessive worries, nervousness, irritability and panic attacks. She endorses having manic symptoms including irritability, extended insomnia, racing thoughts, and impulsivity. She notes that she has experienced mood swings for several years and reports that she has mood fluctuations, depression, and anxiety with use of substances. Patient reports yesterday she was experiencing suicidal ideations with a increased, prior to her suicide attempt.   Patient does not appear acutely manic on exam and she does not elicit or endorse any current psychotic symptoms. Patient currently denies SI/HI/AVH. Patient reports prior history of schizoaffective disorder, cocaine use disorder, alcohol use disorder and cannabis use disorder.  She denies any current outpatient psychiatric follow-up, citing multiple emergency room visits and admissions that limit her ability to follow up outpatient.  She has a history of past psychiatric hospitalizations and past suicide attempts, see below.  She reports drinking occasionally on the during the week and denies potential for withdrawal although self reports diaphoresis, tremors on exm, will start CIWA monitoring for now.  Socially, she lives with her brother.  She has a large support system to include 3 daughters, several grandchildren, mother, and brother who are actively involved in the patient's medical and mental health.  Recommend inpatient psychiatric hospitalization for crisis stabilization to address symptoms of mood lability including depression/mania, anxiety, alcohol use disorder and suicidal ideations. Patient agrees to voluntary admission at this time.   HPI:  56 yo female smoker with hx of schizophrenia and cocaine abuse was found at home by her family laying on floor lethargic after hearing a thump on the floor. She left the house  before EMS could arrive. She was later found by family and taken back home. She reported to intentionally take a bunch of tylenol pills and beer and asked her family to call EMS again. Blood pressure was 78/40 in the ER. She reported abdominal pain. CT abd/pelvis showed mild distal small bowel thickening. Her LFTs were elevated (AST 1794, ALT 501) and initial acetaminophen level was 14. She was started on IV fluids and N-acetylcysteine, and poison control contacted. PCCM asked to arrange for admission to ICU.   Past Psychiatric History: Polysubstance abuse, Depression, Schizoaffective;Bipolar type,  Previous dx's of schizophrenia, bipolar - Multiple inpatient stays (5) [facility], [facility]; last inpatient admission [facility] in 01/2022 after suicide attempt - Follows up at [facility], previously had ACT team - hx of suicide attempt x2 via overdose as a teen, 12/2021 by OD  Risk to Self:  None at this time Risk to Others:  Denies Prior Inpatient Therapy:  See above Prior Outpatient Therapy:   History of [facility],ACT team.   Past Medical History:  Past Medical History:  Diagnosis Date   Abdominal pain    Accidental drug overdose April 2013   Anxiety    Atrial fibrillation ([place]) 09/29/11   converted spontaneously   Chronic back pain    Chronic knee pain    Chronic nausea    Chronic pain    COPD (chronic obstructive pulmonary disease) ([place])    Depression    Diabetes mellitus    states her doctor took her off all DM meds in past month   Diabetic neuropathy ([place])    Dyspnea    with exertion    GERD (gastroesophageal reflux disease)    Headache(784.0)  migraines    HTN (hypertension)    not on meds since in a year    Hyperlipidemia    Hypothyroidism    not on meds in a while    Mental disorder    Bipolar and schizophrenic   Requires supplemental oxygen    as needed per patient    Schizophrenia ([place])    Schizophrenia, acute ([place]) 11/13/2017   Tobacco abuse      Past Surgical History:  Procedure Laterality Date   ABDOMINAL HYSTERECTOMY     BLADDER SUSPENSION  03/04/2011   Procedure: [place];  Surgeon: Elayne Snare MacDiarmid;  Location: [facility];  Service: Urology;  Laterality: N/A;   BOWEL RESECTION N/A 04/18/2022   Procedure: SMALL BOWEL RESECTION;  Surgeon: Erroll Luna, MD;  Location: [facility];  Service: General;  Laterality: N/A;   CYSTOCELE REPAIR  03/04/2011   Procedure: ANTERIOR REPAIR (CYSTOCELE);  Surgeon: Reece Packer;  Location: [facility];  Service: Urology;  Laterality: N/A;   CYSTOSCOPY  03/04/2011   Procedure: CYSTOSCOPY;  Surgeon: Elayne Snare MacDiarmid;  Location: [facility];  Service: Urology;  Laterality: N/A;   ESOPHAGOGASTRODUODENOSCOPY (EGD) WITH PROPOFOL N/A 05/12/2017   Procedure: ESOPHAGOGASTRODUODENOSCOPY (EGD) WITH PROPOFOL;  Surgeon: Alphonsa Overall, MD;  Location: [facility];  Service: General;  Laterality: N/A;   GASTRIC ROUX-EN-Y N/A 03/25/2016   Procedure: LAPAROSCOPIC ROUX-EN-Y GASTRIC BYPASS WITH UPPER ENDOSCOPY;  Surgeon: Excell Seltzer, MD;  Location: [facility];  Service: General;  Laterality: N/A;   KNEE SURGERY     LAPAROSCOPIC ASSISTED VAGINAL HYSTERECTOMY  03/04/2011   Procedure: LAPAROSCOPIC ASSISTED VAGINAL HYSTERECTOMY;  Surgeon: Cyril Mourning, MD;  Location: [facility];  Service: Gynecology;  Laterality: N/A;   LAPAROTOMY N/A 04/18/2022   Procedure: EXPLORATORY LAPAROTOMY;  Surgeon: Erroll Luna, MD;  Location: [facility];  Service: General;  Laterality: N/A;   LAPAROTOMY N/A 04/24/2022   Procedure: BRING BACK EXPLORATORY LAPAROTOMY;  Surgeon: Georganna Skeans, MD;  Location: [facility];  Service: General;  Laterality: N/A;   Family History:  Family History  Problem Relation Age of Onset   Heart attack Father        22s   Diabetes Mother    Heart disease Mother    Hypertension Mother    Heart attack Sister        8   COPD Other    Breast cancer Neg Hx    Family Psychiatric  History: Denies Social History:   Social History   Substance and Sexual Activity  Alcohol Use Yes   Comment: daily     Social History   Substance and Sexual Activity  Drug Use Not Currently   Types: Cocaine, Marijuana, "Crack" cocaine    Social History   Socioeconomic History   Marital status: Legally Separated    Spouse name: Not on file   Number of children: Not on file   Years of education: Not on file   Highest education level: Not on file  Occupational History   Not on file  Tobacco Use   Smoking status: Every Day    Packs/day: 0.50    Types: Cigarettes   Smokeless tobacco: Never  Vaping Use   Vaping Use: Never used  Substance and Sexual Activity   Alcohol use: Yes    Comment: daily   Drug use: Not Currently    Types: Cocaine, Marijuana, "Crack" cocaine   Sexual activity: Yes    Partners: Male  Other Topics Concern   Not on file  Social  History Narrative   Not on file   Social Determinants of Health   Financial Resource Strain: Not on file  Food Insecurity: No Food Insecurity (05/06/2022)   Hunger Vital Sign    Worried About Running Out of Food in the Last Year: Never true    Ran Out of Food in the Last Year: Never true  Transportation Needs: No Transportation Needs (05/06/2022)   PRAPARE - Hydrologist (Medical): No    Lack of Transportation (Non-Medical): No  Physical Activity: Not on file  Stress: Not on file  Social Connections: Moderately Isolated (05/06/2022)   Social Connection and Isolation Panel [NHANES]    Frequency of Communication with Friends and Family: More than three times a week    Frequency of Social Gatherings with Friends and Family: Twice a week    Attends Religious Services: 1 to 4 times per year    Active Member of [employer] or Organizations: No    Attends Archivist Meetings: Never    Marital Status: Separated   Additional Social History:    Allergies:   Allergies  Allergen Reactions   Iron Dextran Shortness Of Breath  and Anxiety   Aspirin Nausea And Vomiting and Other (See Comments)    Ok to take tylenol or ibuprofen     Labs:  Results for orders placed or performed during the hospital encounter of 06/15/22 (from the past 48 hour(s))  CBC with Differential/Platelet     Status: Abnormal   Collection Time: 06/15/22  9:18 PM  Result Value Ref Range   WBC 12.6 (H) 4.0 - 10.5 K/uL   RBC 4.15 3.87 - 5.11 MIL/uL   Hemoglobin 12.8 12.0 - 15.0 g/dL   HCT 39.0 36.0 - 46.0 %   MCV 94.0 80.0 - 100.0 fL   MCH 30.8 26.0 - 34.0 pg   MCHC 32.8 30.0 - 36.0 g/dL   RDW 16.4 (H) 11.5 - 15.5 %   Platelets 163 150 - 400 K/uL   nRBC 0.0 0.0 - 0.2 %   Neutrophils Relative % 89 %   Neutro Abs 11.2 (H) 1.7 - 7.7 K/uL   Lymphocytes Relative 7 %   Lymphs Abs 0.9 0.7 - 4.0 K/uL   Monocytes Relative 4 %   Monocytes Absolute 0.5 0.1 - 1.0 K/uL   Eosinophils Relative 0 %   Eosinophils Absolute 0.0 0.0 - 0.5 K/uL   Basophils Relative 0 %   Basophils Absolute 0.0 0.0 - 0.1 K/uL   Immature Granulocytes 0 %   Abs Immature Granulocytes 0.04 0.00 - 0.07 K/uL    Comment: Performed at [facility], [address], [place], [place] [address]  Comprehensive metabolic panel     Status: Abnormal   Collection Time: 06/15/22  9:18 PM  Result Value Ref Range   Sodium 136 135 - 145 mmol/L   Potassium 4.4 3.5 - 5.1 mmol/L   Chloride 103 98 - 111 mmol/L   CO2 18 (L) 22 - 32 mmol/L   Glucose, Bld 109 (H) 70 - 99 mg/dL    Comment: Glucose reference range applies only to samples taken after fasting for at least 8 hours.   BUN 22 (H) 6 - 20 mg/dL   Creatinine, Ser 1.04 (H) 0.44 - 1.00 mg/dL   Calcium 8.3 (L) 8.9 - 10.3 mg/dL   Total Protein 6.3 (L) 6.5 - 8.1 g/dL   Albumin 3.5 3.5 - 5.0 g/dL   AST 1,794 (H) 15 -  41 U/L   ALT 501 (H) 0 - 44 U/L   Alkaline Phosphatase 106 38 - 126 U/L   Total Bilirubin 1.2 0.3 - 1.2 mg/dL   GFR, Estimated >60 >60 mL/min    Comment: (NOTE) Calculated using the CKD-EPI Creatinine Equation  (2021)    Anion gap 15 5 - 15    Comment: Performed at [facility] [address], [place], [place] [address]  Lactic acid, plasma     Status: None   Collection Time: 06/15/22  9:18 PM  Result Value Ref Range   Lactic Acid, Venous 1.8 0.5 - 1.9 mmol/L    Comment: Performed at [facility] [address] [place], [place] [address]  Lipase, blood     Status: None   Collection Time: 06/15/22  9:18 PM  Result Value Ref Range   Lipase 39 11 - 51 U/L    Comment: Performed at [facility] [address] [place], [place] [address]  Magnesium     Status: None   Collection Time: 06/15/22  9:18 PM  Result Value Ref Range   Magnesium 2.0 1.7 - 2.4 mg/dL    Comment: Performed at [facility] [address], [place], [place] [address]  Salicylate level     Status: Abnormal   Collection Time: 06/15/22  9:18 PM  Result Value Ref Range   Salicylate Lvl <2.6 (L) 7.0 - 30.0 mg/dL    Comment: Performed at [facility] [address], [place], [place] [address]  Acetaminophen level     Status: None   Collection Time: 06/15/22  9:18 PM  Result Value Ref Range   Acetaminophen (Tylenol), Serum 14 10 - 30 ug/mL    Comment: (NOTE) Therapeutic concentrations vary significantly. A range of 10-30 ug/mL  may be an effective concentration for many patients. However, some  are best treated at concentrations outside of this range. Acetaminophen concentrations >150 ug/mL at 4 hours after ingestion  and >50 ug/mL at 12 hours after ingestion are often associated with  toxic reactions.  Performed at [facility], [place] [address] [place], [place] [address]   Ethanol     Status: None   Collection Time: 06/15/22  9:18 PM  Result Value Ref Range   Alcohol, Ethyl (B) <10 <10 mg/dL    Comment: (NOTE) Lowest detectable limit for serum alcohol is 10 mg/dL.  For medical purposes only. Performed at [facility], [place] [address] [place], [name] [address]    Glucose, capillary     Status: Abnormal   Collection Time: 06/16/22  4:01 AM  Result Value Ref Range   Glucose-Capillary 136 (H) 70 - 99 mg/dL    Comment: Glucose reference range applies only to samples taken after fasting for at least 8 hours.  Lactic acid, plasma     Status: None   Collection Time: 06/16/22  4:03 AM  Result Value Ref Range   Lactic Acid, Venous 1.0 0.5 - 1.9 mmol/L    Comment: Performed at [facility] [address], [place], [place] [address]  Comprehensive metabolic panel     Status: Abnormal   Collection Time: 06/16/22  4:03 AM  Result Value Ref Range   Sodium 137 135 - 145 mmol/L   Potassium 4.2 3.5 - 5.1 mmol/L   Chloride 107 98 - 111 mmol/L   CO2 18 (L) 22 - 32 mmol/L   Glucose, Bld 142 (H) 70 -  99 mg/dL    Comment: Glucose reference range applies only to samples taken after fasting for at least 8 hours.   BUN 21 (H) 6 - 20 mg/dL   Creatinine, Ser 0.87 0.44 - 1.00 mg/dL   Calcium 7.8 (L) 8.9 - 10.3 mg/dL   Total Protein 6.8 6.5 - 8.1 g/dL   Albumin 3.4 (L) 3.5 - 5.0 g/dL   AST 1,055 (H) 15 - 41 U/L   ALT 483 (H) 0 - 44 U/L   Alkaline Phosphatase 107 38 - 126 U/L   Total Bilirubin 1.8 (H) 0.3 - 1.2 mg/dL   GFR, Estimated >60 >60 mL/min    Comment: (NOTE) Calculated using the CKD-EPI Creatinine Equation (2021)    Anion gap 12 5 - 15    Comment: Performed at [facility], [place] [address] [place], [place] [address]  Protime-INR     Status: None   Collection Time: 06/16/22  4:03 AM  Result Value Ref Range   Prothrombin Time 14.9 11.4 - 15.2 seconds   INR 1.2 0.8 - 1.2    Comment: (NOTE) INR goal varies based on device and disease states. Performed at [facility], [place] [address] [place], [place] [address]   CBC     Status: Abnormal   Collection Time: 06/16/22  4:03 AM  Result Value Ref Range   WBC 9.6 4.0 - 10.5 K/uL   RBC 4.15 3.87 - 5.11 MIL/uL   Hemoglobin 12.8 12.0 - 15.0 g/dL   HCT 38.1 36.0 - 46.0 %   MCV 91.8 80.0 -  100.0 fL   MCH 30.8 26.0 - 34.0 pg   MCHC 33.6 30.0 - 36.0 g/dL   RDW 16.5 (H) 11.5 - 15.5 %   Platelets 192 150 - 400 K/uL   nRBC 0.0 0.0 - 0.2 %    Comment: Performed at [facility], [place] [address] [place], [place] [address]  Acetaminophen level     Status: Abnormal   Collection Time: 06/16/22  4:03 AM  Result Value Ref Range   Acetaminophen (Tylenol), Serum <10 (L) 10 - 30 ug/mL    Comment: (NOTE) Therapeutic concentrations vary significantly. A range of 10-30 ug/mL  may be an effective concentration for many patients. However, some  are best treated at concentrations outside of this range. Acetaminophen concentrations >150 ug/mL at 4 hours after ingestion  and >50 ug/mL at 12 hours after ingestion are often associated with  toxic reactions.  Performed at [facility], [place] [address], [place], [place] [address]   Magnesium     Status: None   Collection Time: 06/16/22  4:03 AM  Result Value Ref Range   Magnesium 2.0 1.7 - 2.4 mg/dL    Comment: Performed at [facility] [address], [place], [place] [address]  Phosphorus     Status: Abnormal   Collection Time: 06/16/22  4:03 AM  Result Value Ref Range   Phosphorus 5.2 (H) 2.5 - 4.6 mg/dL    Comment: Performed at [facility] [address] [place], [place] [address]  MRSA Next Gen by PCR, Nasal     Status: Abnormal   Collection Time: 06/16/22  4:05 AM   Specimen: Nasal Mucosa; Nasal Swab  Result Value Ref Range   MRSA by PCR Next Gen DETECTED (A) NOT DETECTED    Comment: RESULT CALLED TO, READ BACK BY AND VERIFIED WITH: R SARINE,RN'@0617'$  06/16/22 [place] (NOTE) The GeneXpert MRSA Assay (  FDA approved for NASAL specimens only), is one component of a comprehensive MRSA colonization surveillance program. It is not intended to diagnose MRSA infection nor to guide or monitor treatment for MRSA infections. Test performance is not FDA approved in patients less than 47 years old. Performed at [facility], [place] [address], [place], [place] [address]   I-STAT 7, (LYTES, BLD GAS, ICA, H+H)     Status: Abnormal   Collection Time: 06/16/22  4:50 AM  Result Value Ref Range   pH, Arterial 7.247 (L) 7.35 - 7.45   pCO2 arterial 44.0 32 - 48 mmHg   pO2, Arterial 128 (H) 83 - 108 mmHg   Bicarbonate 19.2 (L) 20.0 - 28.0 mmol/L   TCO2 21 (L) 22 - 32 mmol/L   O2 Saturation 98 %   Acid-base deficit 8.0 (H) 0.0 - 2.0 mmol/L   Sodium 137 135 - 145 mmol/L   Potassium 3.6 3.5 - 5.1 mmol/L   Calcium, Ion 1.11 (L) 1.15 - 1.40 mmol/L   HCT 35.0 (L) 36.0 - 46.0 %   Hemoglobin 11.9 (L) 12.0 - 15.0 g/dL   Patient temperature 98.4 F    Collection site RADIAL, ALLEN'S TEST ACCEPTABLE    Drawn by Operator    Sample type ARTERIAL   Ammonia     Status: None   Collection Time: 06/16/22  4:55 AM  Result Value Ref Range   Ammonia 27 9 - 35 umol/L    Comment: Performed at [facility], [place] [address], [place], [place] [address]    Current Facility-Administered Medications  Medication Dose Route Frequency Provider Last Rate Last Admin   acetylcysteine (ACETADOTE) 18,000 mg in dextrose 5 % 590 mL (30.5085 mg/mL) infusion  15 mg/kg/hr Intravenous Continuous Plunkett, Whitney, MD       Chlorhexidine Gluconate Cloth 2 % PADS 6 each  6 each Topical Daily Chesley Mires, MD   6 each at 06/16/22 0933   fentaNYL (SUBLIMAZE) injection 50 mcg  50 mcg Intravenous Q2H PRN Chesley Mires, MD       folic acid injection 1 mg  1 mg Intravenous Daily Chesley Mires, MD   1 mg at 06/16/22 8657   lactated ringers infusion   Intravenous Continuous Blanchie Dessert, MD   Stopped at 06/16/22 0117   LORazepam (ATIVAN) injection 2 mg  2 mg Intravenous Q4H PRN Chesley Mires, MD       ondansetron (ZOFRAN) injection 4 mg  4 mg Intravenous Q6H PRN Chesley Mires, MD       Oral care mouth rinse  15 mL Mouth Rinse PRN Halford Chessman, Vineet, MD       potassium chloride 10 mEq in 100 mL IVPB  10 mEq Intravenous Q1 Hr x 2 Mohan, Kinila T, MD 100  mL/hr at 06/16/22 1004 10 mEq at 06/16/22 1004   thiamine (VITAMIN B1) injection 100 mg  100 mg Intravenous Daily Chesley Mires, MD   100 mg at 06/16/22 8469    Musculoskeletal: Strength & Muscle Tone: within normal limits Gait & Station: normal Patient leans: N/A  Psychiatric Specialty Exam:  Presentation  General Appearance:  Appropriate for Environment; Casual  Eye Contact: Fair  Speech: Clear and Coherent  Speech Volume: Decreased  Handedness: Right   Mood and Affect  Mood: Depressed; Dysphoric; Hopeless; Worthless  Affect: Congruent   Thought Process  Thought Processes: Coherent; Linear  Descriptions of Associations:Intact  Orientation:Full (Time, Place and Person)  Thought Content:Logical  History of Schizophrenia/Schizoaffective disorder:No  data recorded Duration of Psychotic Symptoms:No data recorded Hallucinations:Hallucinations: None  Ideas of Reference:None  Suicidal Thoughts:Suicidal Thoughts: Yes, Active SI Active Intent and/or Plan: With Intent; With Plan; With Means to Carry Out; With Access to Means  Homicidal Thoughts:Homicidal Thoughts: No   Sensorium  Memory: Immediate Good; Recent Good; Remote Fair  Judgment: Poor  Insight: Poor   Executive Functions  Concentration: Fair  Attention Span: Fair  Recall: [place] of Knowledge: Good  Language: Good   Psychomotor Activity  Psychomotor Activity: Psychomotor Activity: Restlessness; Normal   Assets  Assets: Resilience; Social Support; Physical Health; Housing; Financial Resources/Insurance   Sleep  Sleep: Sleep: Fair   Physical Exam: Physical Exam Vitals and nursing note reviewed.  Constitutional:      General: She is awake.     Appearance: Normal appearance. She is well-developed and normal weight.  HENT:     Head: Normocephalic.     Mouth/Throat:     Comments: hoarseness Cardiovascular:     Rate and Rhythm: Normal rate.  Skin:    General:  Skin is warm.     Capillary Refill: Capillary refill takes less than 2 seconds.  Neurological:     General: No focal deficit present.     Mental Status: She is alert and oriented to person, place, and time. Mental status is at baseline.  Psychiatric:        Mood and Affect: Mood normal.        Behavior: Behavior normal. Behavior is cooperative.        Thought Content: Thought content normal.        Judgment: Judgment normal.    Review of Systems  HENT: Negative.    Eyes: Negative.   Respiratory: Negative.    Cardiovascular: Negative.   Genitourinary: Negative.   Musculoskeletal: Negative.   Skin: Negative.   Neurological: Negative.   Endo/Heme/Allergies: Negative.   Psychiatric/Behavioral:  Positive for substance abuse. Negative for depression, hallucinations, memory loss and suicidal ideas (ETOH). The patient is nervous/anxious. The patient does not have insomnia.   All other systems reviewed and are negative.  Blood pressure 121/66, pulse (!) 103, temperature 98.5 F (36.9 C), temperature source Oral, resp. rate (!) 27, height '5\' 2"'$  (1.575 m), weight 46.9 kg, last menstrual period 01/08/2011, SpO2 100 %. Body mass index is 18.91 kg/m.  Treatment Plan Summary: Daily contact with patient to assess and evaluate symptoms and progress in treatment, Medication management, and Plan    -Continue delirium precautions, patient on day for hospitalization, increased risk for developing delirium associated with, ICU, and other medical comorbidities.  -Continue CIWAA and Lorazepam detox protocol -Will schedule Haldol '2mg'$  po/IM q8 hr for agitation.  -Will monitor Qtc for prolongation. EKG obtained on 12/25; 469 -Patient is wanting to remain in a hospital under voluntary agreement, in the event patient attempts to leave will need to be placed under IVC at that time.  This has been communicated with primary team and nursing staff.  Patient has a history of leaving AMA.  Disposition: Recommend  psychiatric Inpatient admission when medically cleared.  Suella Broad, FNP 06/16/2022 10:08 AM

## 2022-06-16 NOTE — H&P (Signed)
NAME:  [name], MRN:  [id number], DOB:  June 19, 1966, LOS: 0 ADMISSION DATE:  06/15/2022, CONSULTATION DATE:  06/16/2022 REFERRING MD:  Dr. Maryan Rued, ER, CHIEF COMPLAINT:  Overdose   History of Present Illness:  56 yo female smoker with hx of schizophrenia and cocaine abuse was found at home by her family laying on floor lethargic after hearing a thump on the floor.  She left the house before EMS could arrive.  She was later found by family and taken back home.  She reported to intentionally take a bunch of tylenol pills and beer and asked her family to call EMS again.  Blood pressure was 78/40 in the ER.  She reported abdominal pain.  CT abd/pelvis showed mild distal small bowel thickening.  Her LFTs were elevated (AST 1794, ALT 501) and initial acetaminophen level was 14.  She was started on IV fluids and N-acetylcysteine, and poison control contacted.  PCCM asked to arrange for admission to ICU.  Pertinent  Medical History  Polysubstance abuse, Anxiety, A fib, Back pain, COPD, Depression, DM type 2 with neuropathy, GERD, Headache, HTN, HLD, Hypothyroidism, Schizoaffective disorder/bipolar type, s/p Roun-en-Y Gastric bypass  Significant Hospital Events: Including procedures, antibiotic start and stop dates in addition to other pertinent events   12/25 Admit, start NAC  Interim History / Subjective:  She reports drinking at least 40 ounces of beer daily, and more if she can.  She does get the shakes if she doesn't drink.  She denies using cocaine in the past several days.  She has been feeling more depressed and misses her mother.  She doesn't recall what type of pills she took other than she took pain pills.  Her Lt arm hurts and is difficult for her to move this arm.  Lt shoulder xray from 05/27/22 showed acute minimally displaced fracture of the greater tuberosity.  She was set up with an arm sling and was advised to have outpt follow up with orthopedics.  She denies headache, dizziness,  nausea, chest pain, dyspnea, or nausea.  She had surgery on 04/18/22 for lysis of adhesions and resection of SB stricture.  She still has superficial soreness at the surgical site.    Objective   Blood pressure 121/66, pulse (!) 103, temperature 98.4 F (36.9 C), resp. rate (!) 27, height '5\' 2"'$  (1.575 m), weight 46.9 kg, last menstrual period 01/08/2011, SpO2 100 %.        Intake/Output Summary (Last 24 hours) at 06/16/2022 1257 Last data filed at 06/16/2022 1116 Gross per 24 hour  Intake 3098.11 ml  Output 1700 ml  Net 1398.11 ml    Filed Weights   06/15/22 2300 06/16/22 0330  Weight: 43.1 kg 46.9 kg    Examination:  General - ill appearing woman lying in bed in NAD ENT - [place]/AT Cardiac - S1S2, RRR Chest - breathing comfortably on [place] Abdomen - soft, NT Extremities - no LE edema Skin - warm, dry, no rashes Neuro - sleeping but arouses during physical exam Psych - cooperative with exam, repetitive speech-- keeps saying she is hungry.   ABG reviewed.  Bicarb 18 AST 1055 ALT 483 INR 1.5 UDS: +  cocaine CT head personally reviewed> no acute findings  Resolved Hospital Problem list     Assessment & Plan:   Intentional overdose, presumably from acetaminophen -NAC -appreciate poison control's help-- next labs at 1:30AM -con't ICU monitoring  Worsening metabolic acidosis; not on saline to explain this. Worsening lactic acidosis is ominous.  -  bicarb gtt -recheck BMP -serial LA  Hyperammonemia, developing in the hospital Worsening hyperbilirubinemia Increasing INR Concerning for hepatocellular necrosis with dropping transaminases -Despite downtrending acetaminophen, she is developing worse signs of liver injury -GI consult> worried she presented late and remains at risk for fulminant hepatic failure.   Hypotension from hypovolemia -switching fluids to bicarb  Acute anemia, likely  due to frequent blood draws -Transfuse for Hb<7 or hemodynamically significant  bleeding  Polysubstance abuse with alcohol and cocaine -needs counseling on the importance of cessation -vitamins  Suicidal ideation with hx of schizoaffective disorder/bipolar type. -suicide precautions -appreciate psychiatry's involvement; planning on IP psych admission after she medically stabilizes -cannot leave AMA per Psych; will need IVC if she attempts to leave  Left humeral head minimally displaced greater tuberosity fracture. -sling -ortho consult when stable  Hyperglycemia -SSI PRN -goal BG 140-180  Best Practice (right click and "Reselect all SmartList Selections" daily)   Diet/type: Regular consistency (see orders) DVT prophylaxis: LMWH GI prophylaxis: N/A Lines: N/A Foley:  N/A Code Status:  full code Last date of multidisciplinary goals of care discussion '[x]'$   Labs   CBC: Recent Labs  Lab 06/15/22 2118 06/16/22 0403 06/16/22 0450  WBC 12.6* 9.6  --   NEUTROABS 11.2*  --   --   HGB 12.8 12.8 11.9*  HCT 39.0 38.1 35.0*  MCV 94.0 91.8  --   PLT 163 192  --      Basic Metabolic Panel: Recent Labs  Lab 06/15/22 2118 06/16/22 0403 06/16/22 0450  NA 136 137 137  K 4.4 4.2 3.6  CL 103 107  --   CO2 18* 18*  --   GLUCOSE 109* 142*  --   BUN 22* 21*  --   CREATININE 1.04* 0.87  --   CALCIUM 8.3* 7.8*  --   MG 2.0 2.0  --   PHOS  --  5.2*  --     GFR: Estimated Creatinine Clearance: 52.2 mL/min (by C-G formula based on SCr of 0.87 mg/dL). Recent Labs  Lab 06/15/22 2118 06/16/22 0403  WBC 12.6* 9.6  LATICACIDVEN 1.8 1.0     Liver Function Tests: Recent Labs  Lab 06/15/22 2118 06/16/22 0403  AST 1,794* 1,055*  ALT 501* 483*  ALKPHOS 106 107  BILITOT 1.2 1.8*  PROT 6.3* 6.8  ALBUMIN 3.5 3.4*    Recent Labs  Lab 06/15/22 2118  LIPASE 39    Recent Labs  Lab 06/16/22 0455  AMMONIA 27    ABG    Component Value Date/Time   PHART 7.247 (L) 06/16/2022 0450   PCO2ART 44.0 06/16/2022 0450   PO2ART 128 (H) 06/16/2022 0450    HCO3 19.2 (L) 06/16/2022 0450   TCO2 21 (L) 06/16/2022 0450   ACIDBASEDEF 8.0 (H) 06/16/2022 0450   O2SAT 98 06/16/2022 0450     Coagulation Profile: Recent Labs  Lab 06/16/22 0403  INR 1.2     This patient is critically ill with multiple organ system failure which requires frequent high complexity decision making, assessment, support, evaluation, and titration of therapies. This was completed through the application of advanced monitoring technologies and extensive interpretation of multiple databases. During this encounter critical care time was devoted to patient care services described in this note for 50 minutes.  Julian Hy, DO 06/16/22 4:21 PM [facility]  For contact information, see Amion. If no response to pager, please call PCCM consult pager. After hours, 7PM- 7AM, please call Elink.

## 2022-06-16 NOTE — Progress Notes (Addendum)
eLink Physician-Brief Progress Note Patient Name: [name] DOB: 1966/02/23 MRN: [id number]   Date of Service  06/16/2022  HPI/Events of Note  Polysubstance abuse, Anxiety, A fib, Back pain, COPD, Depression, DM type 2 with neuropathy, GERD, Headache, HTN, HLD, Hypothyroidism, Schizoaffective disorder/bipolar type, s/p Roun-en-Y Gastric bypass   Admitted to ICU for tylenol OD, on NAC. Elevated AST/ALT. Fluc/covid neg. CT abd ok.   Camera: Appear alert , awake. Stable VS.  Data: Reviewed    eICU Interventions  Follow poison control advice Asp and fall precautions Follow LFT MVI On CIWA protocol. Low risk for Dvt at this time.      Intervention Category Major Interventions: Other: (New admit: tylenol od)  Elmer Sow 06/16/2022, 3:49 AM  04:48  Camera red alert: Discussed with bed side RN. Change in mental status, no obvious witnessed seizure, did not get any ativan, CIWA was 7 only. Bed side sitter- noticed some eye swaying up towards left side. Desaturated, on nasal o2, now VS stable.  INR normal. Glucose stat also normal.   Plan: -get a stat ABG, amonia and if ok and stable Vitals to go for CT head stat ( cocaine +).  - seizure precautions. Watch for withdrawals.   05:50 AM Calcium 1 gm and Kcl 10 meq every hour for 2 doses ordered for low levels. Cr and amonia normal. AG normal, metabolic acidosis and resp acidosis with pH 7.24. follow ABG at 6 AM again, consider BiPAP with asp precautions. Low thresh hold to intubate if worsening GCS.  CT head is pending to do.  6;40 CTH neg.

## 2022-06-16 NOTE — H&P (Signed)
NAME:  [name], MRN:  [id number], DOB:  1965-08-02, LOS: 0 ADMISSION DATE:  06/15/2022, CONSULTATION DATE:  06/16/2022 REFERRING MD:  Dr. Maryan Rued, ER, CHIEF COMPLAINT:  Overdose   History of Present Illness:  56 yo female smoker with hx of schizophrenia and cocaine abuse was found at home by her family laying on floor lethargic after hearing a thump on the floor.  She left the house before EMS could arrive.  She was later found by family and taken back home.  She reported to intentionally take a bunch of tylenol pills and beer and asked her family to call EMS again.  Blood pressure was 78/40 in the ER.  She reported abdominal pain.  CT abd/pelvis showed mild distal small bowel thickening.  Her LFTs were elevated (AST 1794, ALT 501) and initial acetaminophen level was 14.  She was started on IV fluids and N-acetylcysteine, and poison control contacted.  PCCM asked to arrange for admission to ICU.  Pertinent  Medical History  Polysubstance abuse, Anxiety, A fib, Back pain, COPD, Depression, DM type 2 with neuropathy, GERD, Headache, HTN, HLD, Hypothyroidism, Schizoaffective disorder/bipolar type, s/p Roun-en-Y Gastric bypass  Significant Hospital Events: Including procedures, antibiotic start and stop dates in addition to other pertinent events   12/25 Admit, start NAC  Interim History / Subjective:  She reports drinking at least 40 ounces of beer daily, and more if she can.  She does get the shakes if she doesn't drink.  She denies using cocaine in the past several days.  She has been feeling more depressed and misses her mother.  She doesn't recall what type of pills she took other than she took pain pills.  Her Lt arm hurts and is difficult for her to move this arm.  Lt shoulder xray from 05/27/22 showed acute minimally displaced fracture of the greater tuberosity.  She was set up with an arm sling and was advised to have outpt follow up with orthopedics.  She denies headache, dizziness,  nausea, chest pain, dyspnea, or nausea.  She had surgery on 04/18/22 for lysis of adhesions and resection of SB stricture.  She still has superficial soreness at the surgical site.    Objective   Blood pressure (!) 98/55, pulse 89, temperature 97.9 F (36.6 C), resp. rate 15, height '5\' 2"'$  (1.575 m), weight 43.1 kg, last menstrual period 01/08/2011, SpO2 92 %.        Intake/Output Summary (Last 24 hours) at 06/16/2022 0019 Last data filed at 06/16/2022 0011 Gross per 24 hour  Intake 2006.29 ml  Output --  Net 2006.29 ml   Filed Weights   06/15/22 2300  Weight: 43.1 kg    Examination:  General - alert Eyes - pupils reactive ENT - no sinus tenderness, no stridor Cardiac - regular rate/rhythm, no murmur Chest - equal breath sounds b/l, no wheezing or rales Abdomen - soft, non tender, + bowel sounds Extremities - Lt upper arm with decreased range of motion and bruising over bicep area Skin - no rashes Neuro - normal strength, moves extremities, follows commands Psych - somewhat withdrawn  Resolved Hospital Problem list     Assessment & Plan:   Intentional overdose presumably from acetaminophen. - continue NAC - f/u acetaminophen level, liver enzymes, INR  Hypotension from hypovolemia. - continue IV fluids  Polysubstance abuse with alcohol and cocaine. - check UDS - CIWA q4h with prn ativan for CIWA > 8 - thiamine, folic acid  Suicidal ideation with hx of  schizoaffective disorder/bipolar type. - suicide precautions - consult psychiatry  Left humeral head minimally displaced greater tuberosity fracture. - uncertain if she followed up with orthopedics as outpt after ER stay on 05/27/22 - will need orthopedic assessment arranged again - prn fentanyl for pain control  Best Practice (right click and "Reselect all SmartList Selections" daily)   Diet/type: clear liquids DVT prophylaxis: SCD GI prophylaxis: N/A Lines: N/A Foley:  N/A Code Status:  full code Last  date of multidisciplinary goals of care discussion '[x]'$   Labs   CBC: Recent Labs  Lab 06/15/22 2118  WBC 12.6*  NEUTROABS 11.2*  HGB 12.8  HCT 39.0  MCV 94.0  PLT 726    Basic Metabolic Panel: Recent Labs  Lab 06/15/22 2118  NA 136  K 4.4  CL 103  CO2 18*  GLUCOSE 109*  BUN 22*  CREATININE 1.04*  CALCIUM 8.3*  MG 2.0   GFR: Estimated Creatinine Clearance: 40.1 mL/min (A) (by C-G formula based on SCr of 1.04 mg/dL (H)). Recent Labs  Lab 06/15/22 2118  WBC 12.6*  LATICACIDVEN 1.8    Liver Function Tests: Recent Labs  Lab 06/15/22 2118  AST 1,794*  ALT 501*  ALKPHOS 106  BILITOT 1.2  PROT 6.3*  ALBUMIN 3.5   Recent Labs  Lab 06/15/22 2118  LIPASE 39   No results for input(s): "AMMONIA" in the last 168 hours.  ABG    Component Value Date/Time   PHART 7.415 01/15/2022 0826   PCO2ART 47.0 01/15/2022 0826   PO2ART 72 (L) 01/15/2022 0826   HCO3 29.8 (H) 01/15/2022 0826   TCO2 31 01/15/2022 0826   ACIDBASEDEF 2.0 01/12/2022 0855   O2SAT 93 01/15/2022 0826     Coagulation Profile: No results for input(s): "INR", "PROTIME" in the last 168 hours.  Cardiac Enzymes: No results for input(s): "CKTOTAL", "CKMB", "CKMBINDEX", "TROPONINI" in the last 168 hours.  HbA1C: Hgb A1c MFr Bld  Date/Time Value Ref Range Status  04/05/2022 08:00 AM 6.5 (H) 4.8 - 5.6 % Final    Comment:    (NOTE) Pre diabetes:          5.7%-6.4%  Diabetes:              >6.4%  Glycemic control for   <7.0% adults with diabetes   11/14/2021 05:32 AM 7.4 (H) 4.8 - 5.6 % Final    Comment:    (NOTE) Pre diabetes:          5.7%-6.4%  Diabetes:              >6.4%  Glycemic control for   <7.0% adults with diabetes     CBG: No results for input(s): "GLUCAP" in the last 168 hours.  Review of Systems:   Reviewed and negative  Past Medical History:  She,  has a past medical history of Abdominal pain, Accidental drug overdose (April 2013), Anxiety, Atrial fibrillation  ([place]) (09/29/11), Chronic back pain, Chronic knee pain, Chronic nausea, Chronic pain, COPD (chronic obstructive pulmonary disease) ([place]), Depression, Diabetes mellitus, Diabetic neuropathy ([place]), Dyspnea, GERD (gastroesophageal reflux disease), Headache(784.0), HTN (hypertension), Hyperlipidemia, Hypothyroidism, Mental disorder, Requires supplemental oxygen, Schizophrenia ([place]), Schizophrenia, acute ([place]) (11/13/2017), and Tobacco abuse.   Surgical History:   Past Surgical History:  Procedure Laterality Date   ABDOMINAL HYSTERECTOMY     BLADDER SUSPENSION  03/04/2011   Procedure: [facility] PROCEDURE;  Surgeon: Elayne Snare MacDiarmid;  Location: [facility];  Service: Urology;  Laterality: N/A;   BOWEL RESECTION N/A 04/18/2022  Procedure: SMALL BOWEL RESECTION;  Surgeon: Erroll Luna, MD;  Location: [facility];  Service: General;  Laterality: N/A;   CYSTOCELE REPAIR  03/04/2011   Procedure: ANTERIOR REPAIR (CYSTOCELE);  Surgeon: Reece Packer;  Location: [facility];  Service: Urology;  Laterality: N/A;   CYSTOSCOPY  03/04/2011   Procedure: CYSTOSCOPY;  Surgeon: Elayne Snare MacDiarmid;  Location: [facility];  Service: Urology;  Laterality: N/A;   ESOPHAGOGASTRODUODENOSCOPY (EGD) WITH PROPOFOL N/A 05/12/2017   Procedure: ESOPHAGOGASTRODUODENOSCOPY (EGD) WITH PROPOFOL;  Surgeon: Alphonsa Overall, MD;  Location: [facility];  Service: General;  Laterality: N/A;   GASTRIC ROUX-EN-Y N/A 03/25/2016   Procedure: LAPAROSCOPIC ROUX-EN-Y GASTRIC BYPASS WITH UPPER ENDOSCOPY;  Surgeon: Excell Seltzer, MD;  Location: [facility];  Service: General;  Laterality: N/A;   KNEE SURGERY     LAPAROSCOPIC ASSISTED VAGINAL HYSTERECTOMY  03/04/2011   Procedure: LAPAROSCOPIC ASSISTED VAGINAL HYSTERECTOMY;  Surgeon: Cyril Mourning, MD;  Location: [facility];  Service: Gynecology;  Laterality: N/A;   LAPAROTOMY N/A 04/18/2022   Procedure: EXPLORATORY LAPAROTOMY;  Surgeon: Erroll Luna, MD;  Location: [facility];  Service: General;  Laterality: N/A;    LAPAROTOMY N/A 04/24/2022   Procedure: BRING BACK EXPLORATORY LAPAROTOMY;  Surgeon: Georganna Skeans, MD;  Location: [facility];  Service: General;  Laterality: N/A;     Social History:   reports that she has been smoking cigarettes. She has been smoking an average of .5 packs per day. She has never used smokeless tobacco. She reports current alcohol use. She reports that she does not currently use drugs after having used the following drugs: Cocaine, Marijuana, and "Crack" cocaine.   Family History:  Her family history includes COPD in an other family member; Diabetes in her mother; Heart attack in her father and sister; Heart disease in her mother; Hypertension in her mother. There is no history of Breast cancer.   Allergies Allergies  Allergen Reactions   Iron Dextran Shortness Of Breath and Anxiety   Aspirin Nausea And Vomiting and Other (See Comments)    Ok to take tylenol or ibuprofen      Home Medications  Prior to Admission medications   Medication Sig Start Date End Date Taking? Authorizing Provider  acetaminophen (TYLENOL) 500 MG tablet Take 2 tablets (1,000 mg total) by mouth every 6 (six) hours as needed for mild pain. Patient not taking: Reported on 05/27/2022 04/29/22   Geradine Girt, DO  albuterol [facility] HFA) 108 [phone] Base) MCG/ACT inhaler Inhale 2 puffs into the lungs every 6 (six) hours as needed for wheezing or shortness of breath. Patient not taking: Reported on 05/27/2022 11/16/17   Lindell Spar I, NP  amoxicillin-clavulanate (AUGMENTIN) 875-125 MG tablet Take 1 tablet by mouth 2 (two) times daily. Patient not taking: Reported on 05/27/2022 05/19/22   Oswald Hillock, MD  bisacodyl (DULCOLAX) 10 MG suppository Place 1 suppository (10 mg total) rectally daily as needed for moderate constipation. Patient not taking: Reported on 05/19/2022 04/29/22   Geradine Girt, DO  docusate sodium (COLACE) 100 MG capsule Take 1 capsule (100 mg total) by mouth 2 (two) times daily. Patient not  taking: Reported on 05/27/2022 04/29/22   Geradine Girt, DO  folic acid (FOLVITE) 1 MG tablet Take 1 tablet (1 mg total) by mouth daily. Patient not taking: Reported on 05/19/2022 02/12/22   Elodia Florence., MD  ipratropium-albuterol (DUONEB) 0.5-2.5 (3) MG/3ML SOLN Take 3 mLs by nebulization every 6 (six) hours as needed. Patient not taking: Reported on  05/19/2022 02/12/22   Elodia Florence., MD  methocarbamol (ROBAXIN) 750 MG tablet Take 1 tablet (750 mg total) by mouth every 6 (six) hours as needed for muscle spasms. Patient not taking: Reported on 05/27/2022 04/29/22   Geradine Girt, DO  naloxone [facility]) nasal spray 4 mg/0.1 mL Place 1 spray into the nose once. 04/29/22   [provider]  nicotine (NICODERM CQ - DOSED IN MG/24 HOURS) 21 mg/24hr patch Place 1 patch (21 mg total) onto the skin daily. 04/30/22   Geradine Girt, DO  ondansetron (ZOFRAN) 4 MG tablet Take 1 tablet (4 mg total) by mouth every 6 (six) hours as needed for nausea. Patient not taking: Reported on 05/27/2022 04/29/22   Geradine Girt, DO  traMADol (ULTRAM) 50 MG tablet Take 1 tablet (50 mg total) by mouth every 12 (twelve) hours as needed for severe pain (pain not relieved by oxycodone). Patient not taking: Reported on 05/27/2022 05/19/22   Oswald Hillock, MD     Critical care time: 46 minutes  Chesley Mires, MD [place] Pager - [phone] 06/16/2022, 2:37 AM

## 2022-06-17 ENCOUNTER — Encounter: Payer: Medicare Other | Admitting: Family

## 2022-06-17 DIAGNOSIS — R7401 Elevation of levels of liver transaminase levels: Secondary | ICD-10-CM | POA: Diagnosis not present

## 2022-06-17 DIAGNOSIS — T391X2A Poisoning by 4-Aminophenol derivatives, intentional self-harm, initial encounter: Secondary | ICD-10-CM | POA: Diagnosis not present

## 2022-06-17 DIAGNOSIS — D649 Anemia, unspecified: Secondary | ICD-10-CM | POA: Diagnosis not present

## 2022-06-17 LAB — PROTIME-INR
INR: 1.3 — ABNORMAL HIGH (ref 0.8–1.2)
Prothrombin Time: 16.3 seconds — ABNORMAL HIGH (ref 11.4–15.2)

## 2022-06-17 LAB — COMPREHENSIVE METABOLIC PANEL
ALT: 596 U/L — ABNORMAL HIGH (ref 0–44)
ALT: 817 U/L — ABNORMAL HIGH (ref 0–44)
AST: 886 U/L — ABNORMAL HIGH (ref 15–41)
AST: 1352 U/L — ABNORMAL HIGH (ref 15–41)
Albumin: 2.8 g/dL — ABNORMAL LOW (ref 3.5–5.0)
Albumin: 2.8 g/dL — ABNORMAL LOW (ref 3.5–5.0)
Alkaline Phosphatase: 94 U/L (ref 38–126)
Alkaline Phosphatase: 83 U/L (ref 38–126)
Anion gap: 5 (ref 5–15)
Anion gap: 6 (ref 5–15)
BUN: 8 mg/dL (ref 6–20)
BUN: 5 mg/dL — ABNORMAL LOW (ref 6–20)
CO2: 28 mmol/L (ref 22–32)
CO2: 29 mmol/L (ref 22–32)
Calcium: 8.2 mg/dL — ABNORMAL LOW (ref 8.9–10.3)
Calcium: 8.1 mg/dL — ABNORMAL LOW (ref 8.9–10.3)
Chloride: 107 mmol/L (ref 98–111)
Chloride: 101 mmol/L (ref 98–111)
Creatinine, Ser: 0.43 mg/dL — ABNORMAL LOW (ref 0.44–1.00)
Creatinine, Ser: 0.59 mg/dL (ref 0.44–1.00)
GFR, Estimated: >60 mL/min (ref >60)
GFR, Estimated: >60 mL/min (ref >60)
Glucose, Bld: 102 mg/dL — ABNORMAL HIGH (ref 70–99)
Glucose, Bld: 298 mg/dL — ABNORMAL HIGH (ref 70–99)
Potassium: 3.4 mmol/L — ABNORMAL LOW (ref 3.5–5.1)
Potassium: 4.2 mmol/L (ref 3.5–5.1)
Sodium: 140 mmol/L (ref 135–145)
Sodium: 136 mmol/L (ref 135–145)
Total Bilirubin: 1 mg/dL (ref 0.3–1.2)
Total Bilirubin: 0.7 mg/dL (ref 0.3–1.2)
Total Protein: 5.5 g/dL — ABNORMAL LOW (ref 6.5–8.1)
Total Protein: 5.5 g/dL — ABNORMAL LOW (ref 6.5–8.1)

## 2022-06-17 LAB — GLUCOSE, CAPILLARY
Glucose-Capillary: 105 mg/dL — ABNORMAL HIGH (ref 70–99)
Glucose-Capillary: 141 mg/dL — ABNORMAL HIGH (ref 70–99)
Glucose-Capillary: 150 mg/dL — ABNORMAL HIGH (ref 70–99)
Glucose-Capillary: 188 mg/dL — ABNORMAL HIGH (ref 70–99)
Glucose-Capillary: 198 mg/dL — ABNORMAL HIGH (ref 70–99)
Glucose-Capillary: 80 mg/dL (ref 70–99)

## 2022-06-17 LAB — CBC
HCT: 32.9 % — ABNORMAL LOW (ref 36.0–46.0)
Hemoglobin: 11.1 g/dL — ABNORMAL LOW (ref 12.0–15.0)
MCH: 30.7 pg (ref 26.0–34.0)
MCHC: 33.7 g/dL (ref 30.0–36.0)
MCV: 91.1 fL (ref 80.0–100.0)
Platelets: 121 10*3/uL — ABNORMAL LOW (ref 150–400)
RBC: 3.61 MIL/uL — ABNORMAL LOW (ref 3.87–5.11)
RDW: 16.5 % — ABNORMAL HIGH (ref 11.5–15.5)
WBC: 5.3 10*3/uL (ref 4.0–10.5)
nRBC: 0 % (ref 0.0–0.2)

## 2022-06-17 LAB — VITAMIN B12: Vitamin B-12: 755 pg/mL (ref 180–914)

## 2022-06-17 LAB — HEPATITIS PANEL, ACUTE
HCV Ab: NONREACTIVE
Hep A IgM: NONREACTIVE
Hep B C IgM: NONREACTIVE
Hepatitis B Surface Ag: NONREACTIVE

## 2022-06-17 LAB — AMMONIA: Ammonia: 24 umol/L (ref 9–35)

## 2022-06-17 LAB — TSH: TSH: 1.061 u[IU]/mL (ref 0.350–4.500)

## 2022-06-17 LAB — HEMOGLOBIN A1C
Hgb A1c MFr Bld: 6.1 % — ABNORMAL HIGH (ref 4.8–5.6)
Mean Plasma Glucose: 128 mg/dL

## 2022-06-17 MED ORDER — INSULIN ASPART 100 UNIT/ML IJ SOLN
1.0000 [IU] | Freq: Three times a day (TID) | INTRAMUSCULAR | Status: DC
  Administered 2022-06-17: 1 [IU] via SUBCUTANEOUS
  Administered 2022-06-17: 2 [IU] via SUBCUTANEOUS
  Administered 2022-06-18: 3 [IU] via SUBCUTANEOUS
  Administered 2022-06-18 – 2022-06-19 (×4): 2 [IU] via SUBCUTANEOUS
  Administered 2022-06-19 (×2): 1 [IU] via SUBCUTANEOUS
  Administered 2022-06-19: 3 [IU] via SUBCUTANEOUS
  Administered 2022-06-20 (×2): 2 [IU] via SUBCUTANEOUS
  Administered 2022-06-20: 1 [IU] via SUBCUTANEOUS

## 2022-06-17 MED ORDER — POTASSIUM CHLORIDE 10 MEQ/100ML IV SOLN
10.0000 meq | INTRAVENOUS | Status: AC
  Administered 2022-06-17 (×4): 10 meq via INTRAVENOUS
  Filled 2022-06-17 (×4): qty 100

## 2022-06-17 MED ORDER — MUPIROCIN 2 % EX OINT
1.0000 | TOPICAL_OINTMENT | Freq: Two times a day (BID) | CUTANEOUS | Status: DC
  Administered 2022-06-17 – 2022-06-20 (×8): 1 via NASAL
  Filled 2022-06-17 (×3): qty 22

## 2022-06-17 NOTE — Progress Notes (Signed)
NAME:  JENNI THEW, MRN:  [id number], DOB:  October 05, 1965, LOS: 1 ADMISSION DATE:  06/15/2022, CONSULTATION DATE:  06/16/2022 REFERRING MD:  Dr. Maryan Rued, ER, CHIEF COMPLAINT:  Overdose   History of Present Illness:  56 yo female smoker with hx of schizophrenia and cocaine abuse was found at home by her family laying on floor lethargic after hearing a thump on the floor.  She left the house before EMS could arrive.  She was later found by family and taken back home.  She reported to intentionally take a bunch of tylenol pills and beer and asked her family to call EMS again.  Blood pressure was 78/40 in the ER.  She reported abdominal pain.  CT abd/pelvis showed mild distal small bowel thickening.  Her LFTs were elevated (AST 1794, ALT 501) and initial acetaminophen level was 14.  She was started on IV fluids and N-acetylcysteine, and poison control contacted.  PCCM asked to arrange for admission to ICU.  Pertinent  Medical History  Polysubstance abuse, Anxiety, A fib, Back pain, COPD, Depression, DM type 2 with neuropathy, GERD, Headache, HTN, HLD, Hypothyroidism, Schizoaffective disorder/bipolar type, s/p Roun-en-Y Gastric bypass  Significant Hospital Events: Including procedures, antibiotic start and stop dates in addition to other pertinent events   12/25 Admit, start NAC  Interim History / Subjective:  Remains agitated and impulsive, trying to get OOB.   Objective   Blood pressure (!) 140/78, pulse (!) 113, temperature 98.2 F (36.8 C), temperature source Axillary, resp. rate (!) 34, height '5\' 2"'$  (1.575 m), weight 48.4 kg, last menstrual period 01/08/2011, SpO2 95 %.        Intake/Output Summary (Last 24 hours) at 06/17/2022 1108 Last data filed at 06/17/2022 1000 Gross per 24 hour  Intake 2921.83 ml  Output 4601 ml  Net -1679.17 ml    Filed Weights   06/15/22 2300 06/16/22 0330 06/17/22 0652  Weight: 43.1 kg 46.9 kg 48.4 kg    Examination:  General - chronically ill  appearing woman lying inbed sleepign ENT -[place]/AT, eyes anicteric Cardiac - S1S2, tachycardic, reg rhythm Chest - breathing comfortably on [place], rhonchi R chest Abdomen - soft, NT Extremities - no cyanosis or edema Skin -  no rashes Neuro - sleeping, arouses with stimulation, falls back asleep quickly, not following commands reliably after receiving sedation. Answering some questions. Psych - cooperative with exam, limited by sedation  K+ 4.2 BUN 5 Cr 0.59 AST 1352 ALT 817 T bili 0.7 WBC 5.3 H/H 11.1/32.9 Platelets 121 INR 1.3 Blood cultures> pending  Resolved Hospital Problem list     Assessment & Plan:   Intentional overdose, presumably from acetaminophen -con't NAC since transaminases are worsening -appreciate help from poison control  Worsening metabolic acidosis; not on saline to explain this. LA improved overnight.  -d/c bicarb gtt  -serial BMPs -oral hydration  Hyperammonemia, developing in the hospital- improved with lactulose Worsening hyperbilirubinemia Increasing INR Concerning for hepatocellular necrosis with dropping transaminases> today bilirubin improved -appreciate GI's management -con't lactulose until transaminases improved -con't monitoring LFTs & INR  Hypotension from hypovolemia -switching fluids to bicarb  Persistent tachycardia -EKG  Acute anemia, likely  due to frequent blood draws -transfuse for Hb <7 or hemodynamically significant bleeding  Polysubstance abuse with alcohol and cocaine -needs counseling on the importance of cessation  Suicidal ideation with hx of schizoaffective disorder/bipolar type. -suicide precautions; still needs 1:1 sitter -appreciate psychiatry's involvement; planning on IP psych admission after she medically stabilizes -cannot leave AMA per Psych;  needs IVC if she attempts to leave  Left humeral head minimally displaced greater tuberosity fracture. -sling immobilization on left -OP ortho follow  up  Hyperglycemia -SSI PRN -check A1c -goal bG 140-180  Stable to transfer to med tele unit with 1:1 sitter and suicide precautions.   Best Practice (right click and "Reselect all SmartList Selections" daily)   Diet/type: Regular consistency (see orders) DVT prophylaxis: LMWH GI prophylaxis: N/A Lines: N/A Foley:  N/A Code Status:  full code Last date of multidisciplinary goals of care discussion '[x]'$   Labs   CBC: Recent Labs  Lab 06/15/22 2118 06/16/22 0403 06/16/22 0450 06/16/22 1259 06/17/22 0103  WBC 12.6* 9.6  --   --  5.3  NEUTROABS 11.2*  --   --   --   --   HGB 12.8 12.8 11.9* 11.6* 11.1*  HCT 39.0 38.1 35.0* 34.0* 32.9*  MCV 94.0 91.8  --   --  91.1  PLT 163 192  --   --  121*     Basic Metabolic Panel: Recent Labs  Lab 06/15/22 2118 06/16/22 0403 06/16/22 0450 06/16/22 1259 06/16/22 1437 06/17/22 0105  NA 136 137 137 138 138 140  K 4.4 4.2 3.6 3.4* 4.5 3.4*  CL 103 107  --   --  112* 107  CO2 18* 18*  --   --  18* 28  GLUCOSE 109* 142*  --   --  275* 102*  BUN 22* 21*  --   --  11 8  CREATININE 1.04* 0.87  --   --  0.84 0.43*  CALCIUM 8.3* 7.8*  --   --  8.0* 8.2*  MG 2.0 2.0  --   --   --   --   PHOS  --  5.2*  --   --   --   --     GFR: Estimated Creatinine Clearance: 58.6 mL/min (A) (by C-G formula based on SCr of 0.43 mg/dL (L)). Recent Labs  Lab 06/15/22 2118 06/16/22 0403 06/16/22 1437 06/16/22 1826 06/17/22 0103  WBC 12.6* 9.6  --   --  5.3  LATICACIDVEN 1.8 1.0 4.2* 1.1  --      Liver Function Tests: Recent Labs  Lab 06/15/22 2118 06/16/22 0403 06/16/22 1437 06/17/22 0105  AST 1,794* 1,055* 763* 886*  ALT 501* 483* 476* 596*  ALKPHOS 106 107 88 94  BILITOT 1.2 1.8* 2.0* 1.0  PROT 6.3* 6.8 5.4* 5.5*  ALBUMIN 3.5 3.4* 2.8* 2.8*    Recent Labs  Lab 06/15/22 2118  LIPASE 39    Recent Labs  Lab 06/16/22 0455 06/16/22 1437 06/17/22 0105  AMMONIA 27 48* 24     ABG    Component Value Date/Time   PHART  7.229 (L) 06/16/2022 1259   PCO2ART 37.2 06/16/2022 1259   PO2ART 71 (L) 06/16/2022 1259   HCO3 15.5 (L) 06/16/2022 1259   TCO2 17 (L) 06/16/2022 1259   ACIDBASEDEF 11.0 (H) 06/16/2022 1259   O2SAT 91 06/16/2022 1259     Coagulation Profile: Recent Labs  Lab 06/16/22 0403 06/16/22 1437 06/17/22 0105  INR 1.2 1.5* 1.3*      Julian Hy, DO 06/17/22 11:27 AM [facility]  For contact information, see Amion. If no response to pager, please call PCCM consult pager. After hours, 7PM- 7AM, please call Elink.

## 2022-06-17 NOTE — Progress Notes (Signed)
  This encounter was created in error - please disregard. No show 

## 2022-06-17 NOTE — Progress Notes (Signed)
Orthopedic Tech Progress Note Patient Details:  [name] 07-02-1965 [id number]  Ortho Devices Type of Ortho Device: Arm sling Ortho Device/Splint Location: LUE Ortho Device/Splint Interventions: Ordered, Application, Adjustment   Post Interventions Patient Tolerated: Well Instructions Provided: Care of device  Janit Pagan 06/17/2022, 12:59 PM

## 2022-06-17 NOTE — Progress Notes (Signed)
eLink Physician-Brief Progress Note Patient Name: [name] DOB: 01/24/1966 MRN: [id number]   Date of Service  06/17/2022  HPI/Events of Note  Notified of CBC CMP ammonia and INR. Bicarb improved, LA normal. K is low. AST ALT slightly increased  eICU Interventions  Change bicarb to 75 cc/hour from 125 cc/hour Replete with 40 meq IV Kcl  Recheck CMP at 8 am  RN will also inform poison control Patient otherwise remains stable.      Intervention Category Major Interventions: Delirium, psychosis, severe agitation - evaluation and management  Castiel Lauricella G Danel Requena 06/17/2022, 3:29 AM

## 2022-06-17 NOTE — Consult Note (Addendum)
[facility] Face-to-Face Psychiatry Consult   Reason for Consult: Suicide attempt by overdose Referring Physician: Dr. Halford Chessman Patient Identification: [name] MRN:  [id number] Principal Diagnosis: Overdose Diagnosis:  Principal Problem:   Overdose Active Problems:   Hyperbilirubinemia   Hyperammonemia (HCC)   Pressure injury of skin   Total Time spent with patient: 1 hour  Subjective:   [name] is a 56 y.o. female patient admitted as a suicide attempt by overdose on Tylenol.  Patient tells meThat she has recently relapsed on cocaine and as a result she has had worsening depressive symptoms in addition to increase in suicidal thoughts.  She stated suicidal thoughts began on Saturday, increased on Sunday.  She states she took 2 of 3 bottles of Tylenol.  She reports depressive symptoms of sadness, isolation, withdrawn, worthlessness, hopelessness, guilty, insomnia, poor appetite.  She reports not eating for 2 days prior to this hospitalization, however had an increase in alcohol intake.  She reports drinking beer only.  She states her mother is currently out of town in [place], and understands her children will not be as present due to the holiday.  While she denies active suicidality, she does endorse feeling depressed and knows that she is not an her baseline.   12/26: Patient to sleepy and difficult to arouse despite multiple attempts to awake. She did receive Haldol this morning prior to assessment. Will attempt to reassess at a later time. Patient has received transfer orders, however is disoriented at the time of the assessment.   Provider did call and update daughter, consent was obtained yesterday to speak with family on behalf however in lieu of Christmas holiday collateral phone call was deferred to next business day.  Daughter shares guardian was updated on the patient's current psychiatric status and plan to include inpatient psychiatric hospitalization once medically stable.  She  does remember speaking with her mother the day of overdose, and recalls her being under the influence of alcohol.  However there were no signs of suicidality or imminent risk to self or others.  Patient did allegedly notify her brother (who she resides with) on her overdose, however he remains unclear how long of a time.  Between taking the medication and notifying need for assistance.  She did express concerns about lack of communication between medical team.  This provider did send secure check to current attending Noemi Chapel regarding patient's daughter's request to call for an update.  Recommend inpatient psychiatric hospitalization for crisis stabilization to address symptoms of mood lability including depression/mania, anxiety, alcohol use disorder and suicidal ideations. Patient agrees to voluntary admission at this time.   HPI:  56 yo female smoker with hx of schizophrenia and cocaine abuse was found at home by her family laying on floor lethargic after hearing a thump on the floor. She left the house before EMS could arrive. She was later found by family and taken back home. She reported to intentionally take a bunch of tylenol pills and beer and asked her family to call EMS again. Blood pressure was 78/40 in the ER. She reported abdominal pain. CT abd/pelvis showed mild distal small bowel thickening. Her LFTs were elevated (AST 1794, ALT 501) and initial acetaminophen level was 14. She was started on IV fluids and N-acetylcysteine, and poison control contacted. PCCM asked to arrange for admission to ICU.   Past Psychiatric History: Polysubstance abuse, Depression, Schizoaffective;Bipolar type,  Previous dx's of schizophrenia, bipolar - Multiple inpatient stays (5) [facility], [facility]; last  inpatient admission [facility] in 01/2022 after suicide attempt - Follows up at [facility], previously had ACT team - hx of suicide attempt x2 via overdose as a teen, 12/2021 by OD  Risk to Self:   None at this time Risk to Others:  Denies Prior Inpatient Therapy:  See above Prior Outpatient Therapy:   History of [facility],ACT team.   Past Medical History:  Past Medical History:  Diagnosis Date   Abdominal pain    Accidental drug overdose April 2013   Anxiety    Atrial fibrillation ([place]) 09/29/11   converted spontaneously   Chronic back pain    Chronic knee pain    Chronic nausea    Chronic pain    COPD (chronic obstructive pulmonary disease) ([place])    Depression    Diabetes mellitus    states her doctor took her off all DM meds in past month   Diabetic neuropathy ([place])    Dyspnea    with exertion    GERD (gastroesophageal reflux disease)    Headache(784.0)    migraines    HTN (hypertension)    not on meds since in a year    Hyperlipidemia    Hypothyroidism    not on meds in a while    Mental disorder    Bipolar and schizophrenic   Requires supplemental oxygen    as needed per patient    Schizophrenia ([place])    Schizophrenia, acute ([place]) 11/13/2017   Tobacco abuse     Past Surgical History:  Procedure Laterality Date   ABDOMINAL HYSTERECTOMY     BLADDER SUSPENSION  03/04/2011   Procedure: [facility] PROCEDURE;  Surgeon: Elayne Snare MacDiarmid;  Location: [facility];  Service: Urology;  Laterality: N/A;   BOWEL RESECTION N/A 04/18/2022   Procedure: SMALL BOWEL RESECTION;  Surgeon: Erroll Luna, MD;  Location: [facility];  Service: General;  Laterality: N/A;   CYSTOCELE REPAIR  03/04/2011   Procedure: ANTERIOR REPAIR (CYSTOCELE);  Surgeon: Reece Packer;  Location: [facility];  Service: Urology;  Laterality: N/A;   CYSTOSCOPY  03/04/2011   Procedure: CYSTOSCOPY;  Surgeon: Elayne Snare MacDiarmid;  Location: [facility];  Service: Urology;  Laterality: N/A;   ESOPHAGOGASTRODUODENOSCOPY (EGD) WITH PROPOFOL N/A 05/12/2017   Procedure: ESOPHAGOGASTRODUODENOSCOPY (EGD) WITH PROPOFOL;  Surgeon: Alphonsa Overall, MD;  Location: [facility];  Service: General;  Laterality: N/A;   GASTRIC  ROUX-EN-Y N/A 03/25/2016   Procedure: LAPAROSCOPIC ROUX-EN-Y GASTRIC BYPASS WITH UPPER ENDOSCOPY;  Surgeon: Excell Seltzer, MD;  Location: [facility];  Service: General;  Laterality: N/A;   KNEE SURGERY     LAPAROSCOPIC ASSISTED VAGINAL HYSTERECTOMY  03/04/2011   Procedure: LAPAROSCOPIC ASSISTED VAGINAL HYSTERECTOMY;  Surgeon: Cyril Mourning, MD;  Location: [facility];  Service: Gynecology;  Laterality: N/A;   LAPAROTOMY N/A 04/18/2022   Procedure: EXPLORATORY LAPAROTOMY;  Surgeon: Erroll Luna, MD;  Location: [facility];  Service: General;  Laterality: N/A;   LAPAROTOMY N/A 04/24/2022   Procedure: BRING BACK EXPLORATORY LAPAROTOMY;  Surgeon: Georganna Skeans, MD;  Location: [facility];  Service: General;  Laterality: N/A;   Family History:  Family History  Problem Relation Age of Onset   Heart attack Father        80s   Diabetes Mother    Heart disease Mother    Hypertension Mother    Heart attack Sister        32   COPD Other    Breast cancer Neg Hx    Family  Psychiatric  History: Denies Social History:  Social History   Substance and Sexual Activity  Alcohol Use Yes   Comment: daily     Social History   Substance and Sexual Activity  Drug Use Not Currently   Types: Cocaine, Marijuana, "Crack" cocaine    Social History   Socioeconomic History   Marital status: Legally Separated    Spouse name: Not on file   Number of children: Not on file   Years of education: Not on file   Highest education level: Not on file  Occupational History   Not on file  Tobacco Use   Smoking status: Every Day    Packs/day: 0.50    Types: Cigarettes   Smokeless tobacco: Never  Vaping Use   Vaping Use: Never used  Substance and Sexual Activity   Alcohol use: Yes    Comment: daily   Drug use: Not Currently    Types: Cocaine, Marijuana, "Crack" cocaine   Sexual activity: Yes    Partners: Male  Other Topics Concern   Not on file  Social History Narrative   Not on file   Social Determinants  of Health   Financial Resource Strain: Not on file  Food Insecurity: No Food Insecurity (05/06/2022)   Hunger Vital Sign    Worried About Running Out of Food in the Last Year: Never true    Ran Out of Food in the Last Year: Never true  Transportation Needs: No Transportation Needs (05/06/2022)   PRAPARE - Hydrologist (Medical): No    Lack of Transportation (Non-Medical): No  Physical Activity: Not on file  Stress: Not on file  Social Connections: Moderately Isolated (05/06/2022)   Social Connection and Isolation Panel [NHANES]    Frequency of Communication with Friends and Family: More than three times a week    Frequency of Social Gatherings with Friends and Family: Twice a week    Attends Religious Services: 1 to 4 times per year    Active Member of [employer] or Organizations: No    Attends Archivist Meetings: Never    Marital Status: Separated   Additional Social History:    Allergies:   Allergies  Allergen Reactions   Iron Dextran Shortness Of Breath and Anxiety   Aspirin Nausea And Vomiting and Other (See Comments)    Ok to take tylenol or ibuprofen     Labs:  Results for orders placed or performed during the hospital encounter of 06/15/22 (from the past 48 hour(s))  CBC with Differential/Platelet     Status: Abnormal   Collection Time: 06/15/22  9:18 PM  Result Value Ref Range   WBC 12.6 (H) 4.0 - 10.5 K/uL   RBC 4.15 3.87 - 5.11 MIL/uL   Hemoglobin 12.8 12.0 - 15.0 g/dL   HCT 39.0 36.0 - 46.0 %   MCV 94.0 80.0 - 100.0 fL   MCH 30.8 26.0 - 34.0 pg   MCHC 32.8 30.0 - 36.0 g/dL   RDW 16.4 (H) 11.5 - 15.5 %   Platelets 163 150 - 400 K/uL   nRBC 0.0 0.0 - 0.2 %   Neutrophils Relative % 89 %   Neutro Abs 11.2 (H) 1.7 - 7.7 K/uL   Lymphocytes Relative 7 %   Lymphs Abs 0.9 0.7 - 4.0 K/uL   Monocytes Relative 4 %   Monocytes Absolute 0.5 0.1 - 1.0 K/uL   Eosinophils Relative 0 %   Eosinophils Absolute 0.0 0.0 - 0.5 K/uL  Basophils Relative 0 %   Basophils Absolute 0.0 0.0 - 0.1 K/uL   Immature Granulocytes 0 %   Abs Immature Granulocytes 0.04 0.00 - 0.07 K/uL    Comment: Performed at [facility], [place] [address], [place], [place] [address]  Comprehensive metabolic panel     Status: Abnormal   Collection Time: 06/15/22  9:18 PM  Result Value Ref Range   Sodium 136 135 - 145 mmol/L   Potassium 4.4 3.5 - 5.1 mmol/L   Chloride 103 98 - 111 mmol/L   CO2 18 (L) 22 - 32 mmol/L   Glucose, Bld 109 (H) 70 - 99 mg/dL    Comment: Glucose reference range applies only to samples taken after fasting for at least 8 hours.   BUN 22 (H) 6 - 20 mg/dL   Creatinine, Ser 1.04 (H) 0.44 - 1.00 mg/dL   Calcium 8.3 (L) 8.9 - 10.3 mg/dL   Total Protein 6.3 (L) 6.5 - 8.1 g/dL   Albumin 3.5 3.5 - 5.0 g/dL   AST 1,794 (H) 15 - 41 U/L   ALT 501 (H) 0 - 44 U/L   Alkaline Phosphatase 106 38 - 126 U/L   Total Bilirubin 1.2 0.3 - 1.2 mg/dL   GFR, Estimated >60 >60 mL/min    Comment: (NOTE) Calculated using the CKD-EPI Creatinine Equation (2021)    Anion gap 15 5 - 15    Comment: Performed at [facility], [place] [address] [place], [place] [address]  Lactic acid, plasma     Status: None   Collection Time: 06/15/22  9:18 PM  Result Value Ref Range   Lactic Acid, Venous 1.8 0.5 - 1.9 mmol/L    Comment: Performed at [facility] [address] [place], [place] [address]  Lipase, blood     Status: None   Collection Time: 06/15/22  9:18 PM  Result Value Ref Range   Lipase 39 11 - 51 U/L    Comment: Performed at [facility] [address] [place], [place] [address]  Magnesium     Status: None   Collection Time: 06/15/22  9:18 PM  Result Value Ref Range   Magnesium 2.0 1.7 - 2.4 mg/dL    Comment: Performed at [facility] [address] [place], [place] [address]  Salicylate level     Status: Abnormal   Collection Time: 06/15/22  9:18 PM  Result Value Ref Range   Salicylate Lvl <6.8 (L) 7.0 -  30.0 mg/dL    Comment: Performed at [facility] [address] [place], [place] [address]  Acetaminophen level     Status: None   Collection Time: 06/15/22  9:18 PM  Result Value Ref Range   Acetaminophen (Tylenol), Serum 14 10 - 30 ug/mL    Comment: (NOTE) Therapeutic concentrations vary significantly. A range of 10-30 ug/mL  may be an effective concentration for many patients. However, some  are best treated at concentrations outside of this range. Acetaminophen concentrations >150 ug/mL at 4 hours after ingestion  and >50 ug/mL at 12 hours after ingestion are often associated with  toxic reactions.  Performed at [facility], [place] [address], [place], [place] [address]   Ethanol     Status: None   Collection Time: 06/15/22  9:18 PM  Result Value Ref Range   Alcohol, Ethyl (B) <10 <10 mg/dL    Comment: (NOTE) Lowest detectable limit for serum alcohol is 10  mg/dL.  For medical purposes only. Performed at [facility], [place] [address], [place], [place] [address]   Glucose, capillary     Status: Abnormal   Collection Time: 06/16/22  4:01 AM  Result Value Ref Range   Glucose-Capillary 136 (H) 70 - 99 mg/dL    Comment: Glucose reference range applies only to samples taken after fasting for at least 8 hours.  Lactic acid, plasma     Status: None   Collection Time: 06/16/22  4:03 AM  Result Value Ref Range   Lactic Acid, Venous 1.0 0.5 - 1.9 mmol/L    Comment: Performed at [facility] [address] [place], [place] [address]  Comprehensive metabolic panel     Status: Abnormal   Collection Time: 06/16/22  4:03 AM  Result Value Ref Range   Sodium 137 135 - 145 mmol/L   Potassium 4.2 3.5 - 5.1 mmol/L   Chloride 107 98 - 111 mmol/L   CO2 18 (L) 22 - 32 mmol/L   Glucose, Bld 142 (H) 70 - 99 mg/dL    Comment: Glucose reference range applies only to samples taken after fasting for at least 8 hours.   BUN 21 (H) 6 - 20 mg/dL   Creatinine, Ser 0.87 0.44  - 1.00 mg/dL   Calcium 7.8 (L) 8.9 - 10.3 mg/dL   Total Protein 6.8 6.5 - 8.1 g/dL   Albumin 3.4 (L) 3.5 - 5.0 g/dL   AST 1,055 (H) 15 - 41 U/L   ALT 483 (H) 0 - 44 U/L   Alkaline Phosphatase 107 38 - 126 U/L   Total Bilirubin 1.8 (H) 0.3 - 1.2 mg/dL   GFR, Estimated >60 >60 mL/min    Comment: (NOTE) Calculated using the CKD-EPI Creatinine Equation (2021)    Anion gap 12 5 - 15    Comment: Performed at [facility], [place] [address], [place], [place] [address]  Protime-INR     Status: None   Collection Time: 06/16/22  4:03 AM  Result Value Ref Range   Prothrombin Time 14.9 11.4 - 15.2 seconds   INR 1.2 0.8 - 1.2    Comment: (NOTE) INR goal varies based on device and disease states. Performed at [facility], [place] [address], [place], [place] [address]   CBC     Status: Abnormal   Collection Time: 06/16/22  4:03 AM  Result Value Ref Range   WBC 9.6 4.0 - 10.5 K/uL   RBC 4.15 3.87 - 5.11 MIL/uL   Hemoglobin 12.8 12.0 - 15.0 g/dL   HCT 38.1 36.0 - 46.0 %   MCV 91.8 80.0 - 100.0 fL   MCH 30.8 26.0 - 34.0 pg   MCHC 33.6 30.0 - 36.0 g/dL   RDW 16.5 (H) 11.5 - 15.5 %   Platelets 192 150 - 400 K/uL   nRBC 0.0 0.0 - 0.2 %    Comment: Performed at [facility], [place] [address] [place], [place] [address]  Acetaminophen level     Status: Abnormal   Collection Time: 06/16/22  4:03 AM  Result Value Ref Range   Acetaminophen (Tylenol), Serum <10 (L) 10 - 30 ug/mL    Comment: (NOTE) Therapeutic concentrations vary significantly. A range of 10-30 ug/mL  may be an effective concentration for many patients. However, some  are best treated at concentrations outside of this range. Acetaminophen concentrations >150 ug/mL at 4 hours after ingestion  and >50 ug/mL at 12  hours after ingestion are often associated with  toxic reactions.  Performed at [facility], [place] [address] [place], [place] [address]   Magnesium     Status: None   Collection Time: 06/16/22  4:03  AM  Result Value Ref Range   Magnesium 2.0 1.7 - 2.4 mg/dL    Comment: Performed at [facility] [address], [place], [place] [address]  Phosphorus     Status: Abnormal   Collection Time: 06/16/22  4:03 AM  Result Value Ref Range   Phosphorus 5.2 (H) 2.5 - 4.6 mg/dL    Comment: Performed at [facility] [address], [place], [place] [address]  MRSA Next Gen by PCR, Nasal     Status: Abnormal   Collection Time: 06/16/22  4:05 AM   Specimen: Nasal Mucosa; Nasal Swab  Result Value Ref Range   MRSA by PCR Next Gen DETECTED (A) NOT DETECTED    Comment: RESULT CALLED TO, READ BACK BY AND VERIFIED WITH: R SARINE,RN'@0617'$  06/16/22 [place] (NOTE) The GeneXpert MRSA Assay (FDA approved for NASAL specimens only), is one component of a comprehensive MRSA colonization surveillance program. It is not intended to diagnose MRSA infection nor to guide or monitor treatment for MRSA infections. Test performance is not FDA approved in patients less than 35 years old. Performed at [facility], [place] [address] [place], [place] [address]   I-STAT 7, (LYTES, BLD GAS, ICA, H+H)     Status: Abnormal   Collection Time: 06/16/22  4:50 AM  Result Value Ref Range   pH, Arterial 7.247 (L) 7.35 - 7.45   pCO2 arterial 44.0 32 - 48 mmHg   pO2, Arterial 128 (H) 83 - 108 mmHg   Bicarbonate 19.2 (L) 20.0 - 28.0 mmol/L   TCO2 21 (L) 22 - 32 mmol/L   O2 Saturation 98 %   Acid-base deficit 8.0 (H) 0.0 - 2.0 mmol/L   Sodium 137 135 - 145 mmol/L   Potassium 3.6 3.5 - 5.1 mmol/L   Calcium, Ion 1.11 (L) 1.15 - 1.40 mmol/L   HCT 35.0 (L) 36.0 - 46.0 %   Hemoglobin 11.9 (L) 12.0 - 15.0 g/dL   Patient temperature 98.4 F    Collection site RADIAL, ALLEN'S TEST ACCEPTABLE    Drawn by Operator    Sample type ARTERIAL   Ammonia     Status: None   Collection Time: 06/16/22  4:55 AM  Result Value Ref Range   Ammonia 27 9 - 35 umol/L    Comment: Performed at [facility], [place] [address]  [place], [place] [address]  Rapid urine drug screen (hospital performed)     Status: Abnormal   Collection Time: 06/16/22  9:38 AM  Result Value Ref Range   Opiates NONE DETECTED NONE DETECTED   Cocaine POSITIVE (A) NONE DETECTED   Benzodiazepines NONE DETECTED NONE DETECTED   Amphetamines NONE DETECTED NONE DETECTED   Tetrahydrocannabinol NONE DETECTED NONE DETECTED   Barbiturates NONE DETECTED NONE DETECTED    Comment: (NOTE) DRUG SCREEN FOR MEDICAL PURPOSES ONLY.  IF CONFIRMATION IS NEEDED FOR ANY PURPOSE, NOTIFY LAB WITHIN 5 DAYS.  LOWEST DETECTABLE LIMITS FOR URINE DRUG SCREEN Drug Class                     Cutoff (ng/mL) Amphetamine and metabolites    1000 Barbiturate and metabolites    200 Benzodiazepine  200 Opiates and metabolites        300 Cocaine and metabolites        300 THC                            50 Performed at [facility], [place] [address] [place], [place] [address]   I-STAT 7, (LYTES, BLD GAS, ICA, H+H)     Status: Abnormal   Collection Time: 06/16/22 12:59 PM  Result Value Ref Range   pH, Arterial 7.229 (L) 7.35 - 7.45   pCO2 arterial 37.2 32 - 48 mmHg   pO2, Arterial 71 (L) 83 - 108 mmHg   Bicarbonate 15.5 (L) 20.0 - 28.0 mmol/L   TCO2 17 (L) 22 - 32 mmol/L   O2 Saturation 91 %   Acid-base deficit 11.0 (H) 0.0 - 2.0 mmol/L   Sodium 138 135 - 145 mmol/L   Potassium 3.4 (L) 3.5 - 5.1 mmol/L   Calcium, Ion 1.26 1.15 - 1.40 mmol/L   HCT 34.0 (L) 36.0 - 46.0 %   Hemoglobin 11.6 (L) 12.0 - 15.0 g/dL   Collection site RADIAL, ALLEN'S TEST ACCEPTABLE    Drawn by RT    Sample type ARTERIAL   Comprehensive metabolic panel     Status: Abnormal   Collection Time: 06/16/22  2:37 PM  Result Value Ref Range   Sodium 138 135 - 145 mmol/L   Potassium 4.5 3.5 - 5.1 mmol/L    Comment: HEMOLYSIS AT THIS LEVEL MAY AFFECT RESULT   Chloride 112 (H) 98 - 111 mmol/L   CO2 18 (L) 22 - 32 mmol/L   Glucose, Bld 275 (H) 70 - 99 mg/dL    Comment:  Glucose reference range applies only to samples taken after fasting for at least 8 hours.   BUN 11 6 - 20 mg/dL   Creatinine, Ser 0.84 0.44 - 1.00 mg/dL   Calcium 8.0 (L) 8.9 - 10.3 mg/dL   Total Protein 5.4 (L) 6.5 - 8.1 g/dL   Albumin 2.8 (L) 3.5 - 5.0 g/dL   AST 763 (H) 15 - 41 U/L    Comment: HEMOLYSIS AT THIS LEVEL MAY AFFECT RESULT   ALT 476 (H) 0 - 44 U/L    Comment: HEMOLYSIS AT THIS LEVEL MAY AFFECT RESULT   Alkaline Phosphatase 88 38 - 126 U/L   Total Bilirubin 2.0 (H) 0.3 - 1.2 mg/dL    Comment: HEMOLYSIS AT THIS LEVEL MAY AFFECT RESULT   GFR, Estimated >60 >60 mL/min    Comment: (NOTE) Calculated using the CKD-EPI Creatinine Equation (2021)    Anion gap 8 5 - 15    Comment: Performed at [facility], [place] [address], [place], [place] [address]  Protime-INR     Status: Abnormal   Collection Time: 06/16/22  2:37 PM  Result Value Ref Range   Prothrombin Time 18.0 (H) 11.4 - 15.2 seconds   INR 1.5 (H) 0.8 - 1.2    Comment: (NOTE) INR goal varies based on device and disease states. Performed at [facility], [place] [address] [place], [place] [address]   Ammonia     Status: Abnormal   Collection Time: 06/16/22  2:37 PM  Result Value Ref Range   Ammonia 48 (H) 9 - 35 umol/L    Comment: Performed at [facility], [place] [address], [place], [place] [address]  Lactic acid, plasma     Status:  Abnormal   Collection Time: 06/16/22  2:37 PM  Result Value Ref Range   Lactic Acid, Venous 4.2 (HH) 0.5 - 1.9 mmol/L    Comment: CRITICAL RESULT CALLED TO, READ BACK BY AND VERIFIED WITH O.SIDDIQUI RN 1547 06/16/22 [place] K Performed at [facility] [address] [place], [place] [address]   Glucose, capillary     Status: Abnormal   Collection Time: 06/16/22  4:30 PM  Result Value Ref Range   Glucose-Capillary 126 (H) 70 - 99 mg/dL    Comment: Glucose reference range applies only to samples taken after fasting for at least 8 hours.  Culture, blood  (Routine X 2) w Reflex to ID Panel     Status: None (Preliminary result)   Collection Time: 06/16/22  4:34 PM   Specimen: BLOOD RIGHT HAND  Result Value Ref Range   Specimen Description BLOOD RIGHT HAND    Special Requests      BOTTLES DRAWN AEROBIC AND ANAEROBIC Blood Culture results may not be optimal due to an inadequate volume of blood received in culture bottles   Culture      NO GROWTH < 24 HOURS Performed at [facility] [address] [place], [place] [address]    Report Status PENDING   Lactic acid, plasma     Status: None   Collection Time: 06/16/22  6:26 PM  Result Value Ref Range   Lactic Acid, Venous 1.1 0.5 - 1.9 mmol/L    Comment: Performed at [facility], [place] [address], [place], [place] [address]  Hepatitis panel, acute     Status: None   Collection Time: 06/16/22  6:26 PM  Result Value Ref Range   Hepatitis B Surface Ag NON REACTIVE NON REACTIVE   HCV Ab NON REACTIVE NON REACTIVE    Comment: (NOTE) Nonreactive HCV antibody screen is consistent with no HCV infections,  unless recent infection is suspected or other evidence exists to indicate HCV infection.     Hep A IgM NON REACTIVE NON REACTIVE   Hep B C IgM NON REACTIVE NON REACTIVE    Comment: Performed at [facility], [place] [address] [place], [place] [address]  Vitamin B12     Status: None   Collection Time: 06/16/22  6:26 PM  Result Value Ref Range   Vitamin B-12 755 180 - 914 pg/mL    Comment: (NOTE) This assay is not validated for testing neonatal or myeloproliferative syndrome specimens for Vitamin B12 levels. Performed at [facility], [place] [address], [place], [place] [address]   Glucose, capillary     Status: None   Collection Time: 06/16/22  8:10 PM  Result Value Ref Range   Glucose-Capillary 84 70 - 99 mg/dL    Comment: Glucose reference range applies only to samples taken after fasting for at least 8 hours.  Glucose, capillary     Status: Abnormal   Collection  Time: 06/17/22 12:53 AM  Result Value Ref Range   Glucose-Capillary 105 (H) 70 - 99 mg/dL    Comment: Glucose reference range applies only to samples taken after fasting for at least 8 hours.  CBC     Status: Abnormal   Collection Time: 06/17/22  1:03 AM  Result Value Ref Range   WBC 5.3 4.0 - 10.5 K/uL   RBC 3.61 (L) 3.87 - 5.11 MIL/uL   Hemoglobin 11.1 (L) 12.0 - 15.0 g/dL   HCT 32.9 (L) 36.0 - 46.0 %  MCV 91.1 80.0 - 100.0 fL   MCH 30.7 26.0 - 34.0 pg   MCHC 33.7 30.0 - 36.0 g/dL   RDW 16.5 (H) 11.5 - 15.5 %   Platelets 121 (L) 150 - 400 K/uL   nRBC 0.0 0.0 - 0.2 %    Comment: Performed at [facility] [address], [place], [place] [address]  TSH     Status: None   Collection Time: 06/17/22  1:03 AM  Result Value Ref Range   TSH 1.061 0.350 - 4.500 uIU/mL    Comment: Performed by a 3rd Generation assay with a functional sensitivity of <=0.01 uIU/mL. Performed at [facility], [place] [address], [place], [place] [address]   Comprehensive metabolic panel     Status: Abnormal   Collection Time: 06/17/22  1:05 AM  Result Value Ref Range   Sodium 140 135 - 145 mmol/L   Potassium 3.4 (L) 3.5 - 5.1 mmol/L   Chloride 107 98 - 111 mmol/L   CO2 28 22 - 32 mmol/L   Glucose, Bld 102 (H) 70 - 99 mg/dL    Comment: Glucose reference range applies only to samples taken after fasting for at least 8 hours.   BUN 8 6 - 20 mg/dL   Creatinine, Ser 0.43 (L) 0.44 - 1.00 mg/dL   Calcium 8.2 (L) 8.9 - 10.3 mg/dL   Total Protein 5.5 (L) 6.5 - 8.1 g/dL   Albumin 2.8 (L) 3.5 - 5.0 g/dL   AST 886 (H) 15 - 41 U/L   ALT 596 (H) 0 - 44 U/L   Alkaline Phosphatase 94 38 - 126 U/L   Total Bilirubin 1.0 0.3 - 1.2 mg/dL   GFR, Estimated >60 >60 mL/min    Comment: (NOTE) Calculated using the CKD-EPI Creatinine Equation (2021)    Anion gap 5 5 - 15    Comment: Performed at [facility], [place] [address] [place], [place] [address]  Protime-INR     Status: Abnormal   Collection Time:  06/17/22  1:05 AM  Result Value Ref Range   Prothrombin Time 16.3 (H) 11.4 - 15.2 seconds   INR 1.3 (H) 0.8 - 1.2    Comment: (NOTE) INR goal varies based on device and disease states. Performed at [facility], [place] [address] [place], [place] [address]   Ammonia     Status: None   Collection Time: 06/17/22  1:05 AM  Result Value Ref Range   Ammonia 24 9 - 35 umol/L    Comment: Performed at [facility], [place] [address] [place], [place] [address]  Glucose, capillary     Status: Abnormal   Collection Time: 06/17/22  4:22 AM  Result Value Ref Range   Glucose-Capillary 198 (H) 70 - 99 mg/dL    Comment: Glucose reference range applies only to samples taken after fasting for at least 8 hours.  Glucose, capillary     Status: None   Collection Time: 06/17/22  7:48 AM  Result Value Ref Range   Glucose-Capillary 80 70 - 99 mg/dL    Comment: Glucose reference range applies only to samples taken after fasting for at least 8 hours.  Comprehensive metabolic panel     Status: Abnormal   Collection Time: 06/17/22 10:07 AM  Result Value Ref Range   Sodium 136 135 - 145 mmol/L   Potassium 4.2 3.5 - 5.1 mmol/L   Chloride 101 98 - 111 mmol/L   CO2 29  22 - 32 mmol/L   Glucose, Bld 298 (H) 70 - 99 mg/dL    Comment: Glucose reference range applies only to samples taken after fasting for at least 8 hours.   BUN 5 (L) 6 - 20 mg/dL   Creatinine, Ser 0.59 0.44 - 1.00 mg/dL   Calcium 8.1 (L) 8.9 - 10.3 mg/dL   Total Protein 5.5 (L) 6.5 - 8.1 g/dL   Albumin 2.8 (L) 3.5 - 5.0 g/dL   AST 1,352 (H) 15 - 41 U/L   ALT 817 (H) 0 - 44 U/L   Alkaline Phosphatase 83 38 - 126 U/L   Total Bilirubin 0.7 0.3 - 1.2 mg/dL   GFR, Estimated >60 >60 mL/min    Comment: (NOTE) Calculated using the CKD-EPI Creatinine Equation (2021)    Anion gap 6 5 - 15    Comment: Performed at [facility], [place] [address], [place], [place] [address]  Glucose, capillary     Status: Abnormal   Collection Time:  06/17/22 11:51 AM  Result Value Ref Range   Glucose-Capillary 150 (H) 70 - 99 mg/dL    Comment: Glucose reference range applies only to samples taken after fasting for at least 8 hours.  Glucose, capillary     Status: Abnormal   Collection Time: 06/17/22  4:21 PM  Result Value Ref Range   Glucose-Capillary 188 (H) 70 - 99 mg/dL    Comment: Glucose reference range applies only to samples taken after fasting for at least 8 hours.   Comment 1 Notify RN    Comment 2 Document in Chart     Current Facility-Administered Medications  Medication Dose Route Frequency Provider Last Rate Last Admin   acetylcysteine (ACETADOTE) 18,000 mg in dextrose 5 % 590 mL (30.5085 mg/mL) infusion  15 mg/kg/hr Intravenous Continuous Plunkett, Whitney, MD 21.2 mL/hr at 06/17/22 1100 15 mg/kg/hr at 06/17/22 1100   cefTRIAXone (ROCEPHIN) 2 g in sodium chloride 0.9 % 100 mL IVPB  2 g Intravenous Q24H Noemi Chapel P, DO 200 mL/hr at 06/17/22 1702 2 g at 06/17/22 1702   Chlorhexidine Gluconate Cloth 2 % PADS 6 each  6 each Topical Daily Chesley Mires, MD   6 each at 06/16/22 0933   enoxaparin (LOVENOX) injection 40 mg  40 mg Subcutaneous Q24H Noemi Chapel P, DO   40 mg at 06/17/22 1509   fentaNYL (SUBLIMAZE) injection 50 mcg  50 mcg Intravenous Q2H PRN Chesley Mires, MD   50 mcg at 62/69/48 5462   folic acid injection 1 mg  1 mg Intravenous Daily Chesley Mires, MD   1 mg at 06/17/22 1201   haloperidol (HALDOL) tablet 2 mg  2 mg Oral Q6H PRN Suella Broad, FNP   2 mg at 06/17/22 0947   Or   haloperidol lactate (HALDOL) injection 2 mg  2 mg Intramuscular Q6H PRN Suella Broad, FNP   2 mg at 06/16/22 2102   insulin aspart (novoLOG) injection 1-3 Units  1-3 Units Subcutaneous TID AC & HS Noemi Chapel P, DO   2 Units at 06/17/22 1656   lactulose (CHRONULAC) 10 GM/15ML solution 10 g  10 g Oral TID Einar Grad, RPH   10 g at 06/17/22 1509   LORazepam (ATIVAN) injection 2 mg  2 mg Intravenous Q4H PRN Chesley Mires, MD   2 mg at 06/17/22 0442   mupirocin ointment (BACTROBAN) 2 % 1 Application  1 Application Nasal BID Chesley Mires, MD   1 Application at  06/17/22 0954   ondansetron (ZOFRAN) injection 4 mg  4 mg Intravenous Q6H PRN Chesley Mires, MD   4 mg at 06/16/22 1640   Oral care mouth rinse  15 mL Mouth Rinse PRN Chesley Mires, MD       sodium bicarbonate 150 mEq in sterile water 1,150 mL infusion   Intravenous Continuous Margaretmary Lombard, MD 75 mL/hr at 06/17/22 1507 New Bag at 06/17/22 1507   thiamine (VITAMIN B1) injection 100 mg  100 mg Intravenous Daily Chesley Mires, MD   100 mg at 06/17/22 0949   vancomycin (VANCOREADY) IVPB 750 mg/150 mL  750 mg Intravenous Q24H Einar Grad, [facility]   Stopped at 06/16/22 1823    Musculoskeletal: Strength & Muscle Tone: within normal limits Gait & Station: normal Patient leans: N/A  Psychiatric Specialty Exam:  Presentation  General Appearance:  Appropriate for Environment; Casual  Eye Contact: Fair  Speech: Clear and Coherent  Speech Volume: Decreased  Handedness: Right   Mood and Affect  Mood: Depressed; Dysphoric; Hopeless; Worthless  Affect: Sports coach Processes: Coherent; Linear  Descriptions of Associations:Intact  Orientation:Full (Time, Place and Person)  Thought Content:Logical  History of Schizophrenia/Schizoaffective disorder:No data recorded Duration of Psychotic Symptoms:No data recorded Hallucinations:Hallucinations: None  Ideas of Reference:None  Suicidal Thoughts:Suicidal Thoughts: Yes, Active SI Active Intent and/or Plan: With Intent; With Plan; With Means to Carry Out; With Access to Means  Homicidal Thoughts:Homicidal Thoughts: No   Sensorium  Memory: Immediate Good; Recent Good; Remote Fair  Judgment: Poor  Insight: Poor   Executive Functions  Concentration: Fair  Attention Span: Fair  Recall: [place] of  Knowledge: Good  Language: Good   Psychomotor Activity  Psychomotor Activity: Psychomotor Activity: Restlessness; Normal   Assets  Assets: Resilience; Social Support; Physical Health; Housing; Financial Resources/Insurance   Sleep  Sleep: Sleep: Fair   Physical Exam: Physical Exam Vitals and nursing note reviewed.  Constitutional:      General: She is awake.     Appearance: Normal appearance. She is well-developed and normal weight.  HENT:     Head: Normocephalic.     Mouth/Throat:     Comments: hoarseness Cardiovascular:     Rate and Rhythm: Normal rate.  Skin:    General: Skin is warm.     Capillary Refill: Capillary refill takes less than 2 seconds.  Neurological:     General: No focal deficit present.     Mental Status: She is alert and oriented to person, place, and time. Mental status is at baseline.  Psychiatric:        Mood and Affect: Mood normal.        Behavior: Behavior normal. Behavior is cooperative.        Thought Content: Thought content normal.        Judgment: Judgment normal.    Review of Systems  HENT: Negative.    Eyes: Negative.   Respiratory: Negative.    Cardiovascular: Negative.   Genitourinary: Negative.   Musculoskeletal: Negative.   Skin: Negative.   Neurological: Negative.   Endo/Heme/Allergies: Negative.   Psychiatric/Behavioral:  Positive for depression, substance abuse and suicidal ideas. Negative for hallucinations and memory loss. The patient is nervous/anxious and has insomnia.   All other systems reviewed and are negative.  Blood pressure 137/85, pulse (!) 110, temperature 99.9 F (37.7 C), temperature source Oral, resp. rate 17, height '5\' 2"'$  (1.575 m), weight 48.4 kg, last menstrual period 01/08/2011, SpO2 96 %.  Body mass index is 19.52 kg/m.  Treatment Plan Summary: Daily contact with patient to assess and evaluate symptoms and progress in treatment, Medication management, and Plan    -Continue delirium  precautions, patient on day for hospitalization, increased risk for developing delirium associated with, ICU, and other medical comorbidities.  -Continue current treatment per recommendations for primary team in gastroenterology.  There do not appear to be concerns regarding acute liver injury, Haldol remains most effective psychotropic medication to help manage delirium, agitation, aggression.  Patient may also benefit from starting phenobarbital if she continues to remain agitated and aggressive.  Thus far she has received 2 doses of as needed Haldol, no current documentation in the system as to why this medication was received, however after speaking with bedside nurse patient remains restless, continues to attempt to get out of bed.  -Continue CIWAA and Lorazepam detox protocol -Will schedule Haldol '2mg'$  po/IM q8 hr for agitation.  -Will monitor Qtc for prolongation. EKG obtained on 12/25; 469 -Patient is wanting to remain in a hospital under voluntary agreement, in the event patient attempts to leave will need to be placed under IVC at that time.  This has been communicated with primary team and nursing staff.  Patient has a history of leaving AMA.  Disposition: Recommend psychiatric Inpatient admission when medically cleared.  Suella Broad, FNP 06/17/2022 6:00 PM

## 2022-06-17 NOTE — Progress Notes (Signed)
Patient received orin. To room [facility]. into help w/ assessment . Patient sleeping orin. Answered all questions approp. No orders for suicide sitter and no orders for cooling blanket. Was told in report that is why patient was coming to our unit. Carmeron Ouedrags R.N. ws notified and order sitter and to use ice packs for fever.

## 2022-06-17 NOTE — Progress Notes (Signed)
eLink Physician-Brief Progress Note Patient Name: [name] DOB: 1965-12-28 MRN: [id number]   Date of Service  06/17/2022  HPI/Events of Note  Patient admitted with intentional overdose of Tylenol in a suicide attempt.  eICU Interventions  1:1 sitter ordered.        Kerry Kass [name] 06/17/2022, 9:25 PM

## 2022-06-17 NOTE — Progress Notes (Signed)
Dr. Carlis Abbott gave order to d/c continuous pulse ox and to change CBG and SS insulin coverage to [facility]. Patient transferred to room 08 on 35M by bed with Lattie Haw, RN and [place], [place]. Patient left 2H with no distress noted. Report called to [facility], RN prior to transfer.

## 2022-06-17 NOTE — Consult Note (Signed)
Reason for Consult:Intentional drug overdose.  Referring Physician: CCM  [name] is an 56 y.o. female.  HPI: 56 year old black female, with multiple medical issues listed below, admitted with accidental drug overdose-Tylenol.She has been depressed. She was brought to the ER by EMS. She was initially found on the floor by her family. She was noted to be hypotensive with AST of 1794 and an ALT of 501. Her initial Acetaminophen level was 14. She was started on IV fluids and NAC. Poison control was contacted. She was admitted to stepdown. She denies having any abdominal pain and wants her diet advanced. Her AST/ALT today are 886/596 with an INR of 1.3. TB 1.3. Dr. Bryan Lemma informed me that [facility] was contacted about this case but due to her extensive psychiatric history and history of drug abuse she is not a candidate for OLT, if the need arises. She received a dose of Vancomycin and Ceftriaxone yesterday.  Past Medical History:  Diagnosis Date   Abdominal pain    Anxiety    Atrial fibrillation ([place]) 09/29/11   converted spontaneously   Chronic back pain    Chronic knee pain    Chronic nausea    Chronic pain    COPD (chronic obstructive pulmonary disease) ([place])    Depression    Diabetes mellitus    states her doctor took her off all DM meds in past month   Diabetic neuropathy ([place])    Dyspnea    with exertion    GERD (gastroesophageal reflux disease)    Headache(784.0)    migraines    HTN (hypertension)    not on meds since in a year    Hyperlipidemia    Hypothyroidism    not on meds in a while    Mental disorder    Bipolar and schizophrenic   Requires supplemental oxygen    as needed per patient    Schizophrenia ([place])    Schizophrenia, acute ([place]) 11/13/2017   Tobacco abuse    Past Surgical History:  Procedure Laterality Date   ABDOMINAL HYSTERECTOMY     BLADDER SUSPENSION  03/04/2011   Procedure: [facility] PROCEDURE;  Surgeon: Elayne Snare MacDiarmid;  Location: [facility];  Service:  Urology;  Laterality: N/A;   BOWEL RESECTION N/A 04/18/2022   Procedure: SMALL BOWEL RESECTION;  Surgeon: Erroll Luna, MD;  Location: [facility];  Service: General;  Laterality: N/A;   CYSTOCELE REPAIR  03/04/2011   Procedure: ANTERIOR REPAIR (CYSTOCELE);  Surgeon: Reece Packer;  Location: [facility];  Service: Urology;  Laterality: N/A;   CYSTOSCOPY  03/04/2011   Procedure: CYSTOSCOPY;  Surgeon: Elayne Snare MacDiarmid;  Location: [facility];  Service: Urology;  Laterality: N/A;   ESOPHAGOGASTRODUODENOSCOPY (EGD) WITH PROPOFOL N/A 05/12/2017   Procedure: ESOPHAGOGASTRODUODENOSCOPY (EGD) WITH PROPOFOL;  Surgeon: Alphonsa Overall, MD;  Location: [facility];  Service: General;  Laterality: N/A;   GASTRIC ROUX-EN-Y N/A 03/25/2016   Procedure: LAPAROSCOPIC ROUX-EN-Y GASTRIC BYPASS WITH UPPER ENDOSCOPY;  Surgeon: Excell Seltzer, MD;  Location: [facility];  Service: General;  Laterality: N/A;   KNEE SURGERY     LAPAROSCOPIC ASSISTED VAGINAL HYSTERECTOMY  03/04/2011   Procedure: LAPAROSCOPIC ASSISTED VAGINAL HYSTERECTOMY;  Surgeon: Cyril Mourning, MD;  Location: [facility];  Service: Gynecology;  Laterality: N/A;   LAPAROTOMY N/A 04/18/2022   Procedure: EXPLORATORY LAPAROTOMY;  Surgeon: Erroll Luna, MD;  Location: [facility];  Service: General;  Laterality: N/A;   LAPAROTOMY N/A 04/24/2022   Procedure: BRING BACK EXPLORATORY LAPAROTOMY;  Surgeon: Grandville Silos,  Lavone Neri, MD;  Location: [facility];  Service: General;  Laterality: N/A;   Family History  Problem Relation Age of Onset   Heart attack Father        32s   Diabetes Mother    Heart disease Mother    Hypertension Mother    Heart attack Sister        57   COPD Other    Breast cancer Neg Hx    Social History:  reports that she has been smoking cigarettes. She has been smoking an average of .5 packs per day. She has never used smokeless tobacco. She reports current alcohol use. She reports that she does not currently use drugs after having used the following drugs:  Cocaine, Marijuana, and "Crack" cocaine.  Allergies:  Allergies  Allergen Reactions   Iron Dextran Shortness Of Breath and Anxiety   Aspirin Nausea And Vomiting and Other (See Comments)    Ok to take tylenol or ibuprofen    Medications: I have reviewed the patient's current medications. Prior to Admission:  Medications Prior to Admission  Medication Sig Dispense Refill Last Dose   albuterol (PROAIR HFA) 108 (90 Base) MCG/ACT inhaler Inhale 2 puffs into the lungs every 6 (six) hours as needed for wheezing or shortness of breath.   Past Week   naloxone (NARCAN) nasal spray 4 mg/0.1 mL Place 1 spray into the nose once.   UNKNOWN   hydrOXYzine (VISTARIL) 25 MG capsule Take 50 mg by mouth 2 (two) times daily. (Patient not taking: Reported on 06/16/2022)   Not Taking   ibuprofen (ADVIL) 800 MG tablet Take 800 mg by mouth every 6 (six) hours as needed for moderate pain. (Patient not taking: Reported on 06/16/2022)   Not Taking   PARoxetine (PAXIL) 10 MG tablet Take 10 mg by mouth daily. (Patient not taking: Reported on 06/16/2022)   Not Taking   QUEtiapine (SEROQUEL) 400 MG tablet Take 400 mg by mouth at bedtime. (Patient not taking: Reported on 06/16/2022)   Not Taking   traZODone (DESYREL) 50 MG tablet Take 50 mg by mouth at bedtime. (Patient not taking: Reported on 06/16/2022)   Not Taking   Scheduled:  Chlorhexidine Gluconate Cloth  6 each Topical Daily   enoxaparin (LOVENOX) injection  40 mg Subcutaneous X10G   folic acid  1 mg Intravenous Daily   insulin aspart  1-3 Units Subcutaneous Q4H   lactulose  10 g Oral TID   mupirocin ointment  1 Application Nasal BID   thiamine (VITAMIN B1) injection  100 mg Intravenous Daily   Continuous:  acetylcysteine 15 mg/kg/hr (06/17/22 0600)   cefTRIAXone (ROCEPHIN)  IV Stopped (06/16/22 1712)   sodium bicarbonate 150 mEq in sterile water 1,150 mL infusion 75 mL/hr at 06/17/22 0600   vancomycin Stopped (06/16/22 1823)   YIR:SWNIOEVO (SUBLIMAZE)  injection, haloperidol **OR** haloperidol lactate, LORazepam, ondansetron, mouth rinse  Results for orders placed or performed during the hospital encounter of 06/15/22 (from the past 48 hour(s))  CBC with Differential/Platelet     Status: Abnormal   Collection Time: 06/15/22  9:18 PM  Result Value Ref Range   WBC 12.6 (H) 4.0 - 10.5 K/uL   RBC 4.15 3.87 - 5.11 MIL/uL   Hemoglobin 12.8 12.0 - 15.0 g/dL   HCT 39.0 36.0 - 46.0 %   MCV 94.0 80.0 - 100.0 fL   MCH 30.8 26.0 - 34.0 pg   MCHC 32.8 30.0 - 36.0 g/dL   RDW 16.4 (H) 11.5 - 15.5 %  Platelets 163 150 - 400 K/uL   nRBC 0.0 0.0 - 0.2 %   Neutrophils Relative % 89 %   Neutro Abs 11.2 (H) 1.7 - 7.7 K/uL   Lymphocytes Relative 7 %   Lymphs Abs 0.9 0.7 - 4.0 K/uL   Monocytes Relative 4 %   Monocytes Absolute 0.5 0.1 - 1.0 K/uL   Eosinophils Relative 0 %   Eosinophils Absolute 0.0 0.0 - 0.5 K/uL   Basophils Relative 0 %   Basophils Absolute 0.0 0.0 - 0.1 K/uL   Immature Granulocytes 0 %   Abs Immature Granulocytes 0.04 0.00 - 0.07 K/uL    Comment: Performed at [facility] [address], [place], [place] [address]  Comprehensive metabolic panel     Status: Abnormal   Collection Time: 06/15/22  9:18 PM  Result Value Ref Range   Sodium 136 135 - 145 mmol/L   Potassium 4.4 3.5 - 5.1 mmol/L   Chloride 103 98 - 111 mmol/L   CO2 18 (L) 22 - 32 mmol/L   Glucose, Bld 109 (H) 70 - 99 mg/dL    Comment: Glucose reference range applies only to samples taken after fasting for at least 8 hours.   BUN 22 (H) 6 - 20 mg/dL   Creatinine, Ser 1.04 (H) 0.44 - 1.00 mg/dL   Calcium 8.3 (L) 8.9 - 10.3 mg/dL   Total Protein 6.3 (L) 6.5 - 8.1 g/dL   Albumin 3.5 3.5 - 5.0 g/dL   AST 1,794 (H) 15 - 41 U/L   ALT 501 (H) 0 - 44 U/L   Alkaline Phosphatase 106 38 - 126 U/L   Total Bilirubin 1.2 0.3 - 1.2 mg/dL   GFR, Estimated >60 >60 mL/min    Comment: (NOTE) Calculated using the CKD-EPI Creatinine Equation (2021)    Anion gap 15 5 - 15     Comment: Performed at [facility], [place] [address] [place], [place] [address]  Lactic acid, plasma     Status: None   Collection Time: 06/15/22  9:18 PM  Result Value Ref Range   Lactic Acid, Venous 1.8 0.5 - 1.9 mmol/L    Comment: Performed at [facility] [address] [place], [place] [address]  Lipase, blood     Status: None   Collection Time: 06/15/22  9:18 PM  Result Value Ref Range   Lipase 39 11 - 51 U/L    Comment: Performed at [facility] [address], [place], [place] [address]  Magnesium     Status: None   Collection Time: 06/15/22  9:18 PM  Result Value Ref Range   Magnesium 2.0 1.7 - 2.4 mg/dL    Comment: Performed at [facility] [address], [place], [place] [address]  Salicylate level     Status: Abnormal   Collection Time: 06/15/22  9:18 PM  Result Value Ref Range   Salicylate Lvl <7.5 (L) 7.0 - 30.0 mg/dL    Comment: Performed at [facility] [address] [place], [place] [address]  Acetaminophen level     Status: None   Collection Time: 06/15/22  9:18 PM  Result Value Ref Range   Acetaminophen (Tylenol), Serum 14 10 - 30 ug/mL    Comment: (NOTE) Therapeutic concentrations vary significantly. A range of 10-30 ug/mL  may be an effective concentration for many patients. However, some  are best treated at concentrations outside of this range. Acetaminophen concentrations >150 ug/mL  at 4 hours after ingestion  and >50 ug/mL at 12 hours after ingestion are often associated with  toxic reactions.  Performed at [facility], [place] [address], [place], [place] [address]   Ethanol     Status: None   Collection Time: 06/15/22  9:18 PM  Result Value Ref Range   Alcohol, Ethyl (B) <10 <10 mg/dL    Comment: (NOTE) Lowest detectable limit for serum alcohol is 10 mg/dL.  For medical purposes only. Performed at [facility], [place] [address] [place], [place] [address]   Glucose, capillary     Status: Abnormal    Collection Time: 06/16/22  4:01 AM  Result Value Ref Range   Glucose-Capillary 136 (H) 70 - 99 mg/dL    Comment: Glucose reference range applies only to samples taken after fasting for at least 8 hours.  Lactic acid, plasma     Status: None   Collection Time: 06/16/22  4:03 AM  Result Value Ref Range   Lactic Acid, Venous 1.0 0.5 - 1.9 mmol/L    Comment: Performed at [facility] [address] [place], [place] [address]  Comprehensive metabolic panel     Status: Abnormal   Collection Time: 06/16/22  4:03 AM  Result Value Ref Range   Sodium 137 135 - 145 mmol/L   Potassium 4.2 3.5 - 5.1 mmol/L   Chloride 107 98 - 111 mmol/L   CO2 18 (L) 22 - 32 mmol/L   Glucose, Bld 142 (H) 70 - 99 mg/dL    Comment: Glucose reference range applies only to samples taken after fasting for at least 8 hours.   BUN 21 (H) 6 - 20 mg/dL   Creatinine, Ser 0.87 0.44 - 1.00 mg/dL   Calcium 7.8 (L) 8.9 - 10.3 mg/dL   Total Protein 6.8 6.5 - 8.1 g/dL   Albumin 3.4 (L) 3.5 - 5.0 g/dL   AST 1,055 (H) 15 - 41 U/L   ALT 483 (H) 0 - 44 U/L   Alkaline Phosphatase 107 38 - 126 U/L   Total Bilirubin 1.8 (H) 0.3 - 1.2 mg/dL   GFR, Estimated >60 >60 mL/min    Comment: (NOTE) Calculated using the CKD-EPI Creatinine Equation (2021)    Anion gap 12 5 - 15    Comment: Performed at [facility], [place] [address] [place], [place] [address]  Protime-INR     Status: None   Collection Time: 06/16/22  4:03 AM  Result Value Ref Range   Prothrombin Time 14.9 11.4 - 15.2 seconds   INR 1.2 0.8 - 1.2    Comment: (NOTE) INR goal varies based on device and disease states. Performed at [facility], [place] [address] [place], [place] [address]   CBC     Status: Abnormal   Collection Time: 06/16/22  4:03 AM  Result Value Ref Range   WBC 9.6 4.0 - 10.5 K/uL   RBC 4.15 3.87 - 5.11 MIL/uL   Hemoglobin 12.8 12.0 - 15.0 g/dL   HCT 38.1 36.0 - 46.0 %   MCV 91.8 80.0 - 100.0 fL   MCH 30.8 26.0 - 34.0 pg   MCHC  33.6 30.0 - 36.0 g/dL   RDW 16.5 (H) 11.5 - 15.5 %   Platelets 192 150 - 400 K/uL   nRBC 0.0 0.0 - 0.2 %    Comment: Performed at [facility], [place] [address] [place], [place] [address]  Acetaminophen level  Status: Abnormal   Collection Time: 06/16/22  4:03 AM  Result Value Ref Range   Acetaminophen (Tylenol), Serum <10 (L) 10 - 30 ug/mL    Comment: (NOTE) Therapeutic concentrations vary significantly. A range of 10-30 ug/mL  may be an effective concentration for many patients. However, some  are best treated at concentrations outside of this range. Acetaminophen concentrations >150 ug/mL at 4 hours after ingestion  and >50 ug/mL at 12 hours after ingestion are often associated with  toxic reactions.  Performed at [facility], [place] [address] [place], [place] [address]   Magnesium     Status: None   Collection Time: 06/16/22  4:03 AM  Result Value Ref Range   Magnesium 2.0 1.7 - 2.4 mg/dL    Comment: Performed at [facility] [address] [place], [place] [address]  Phosphorus     Status: Abnormal   Collection Time: 06/16/22  4:03 AM  Result Value Ref Range   Phosphorus 5.2 (H) 2.5 - 4.6 mg/dL    Comment: Performed at [facility] [address] [place], [place] [address]  MRSA Next Gen by PCR, Nasal     Status: Abnormal   Collection Time: 06/16/22  4:05 AM   Specimen: Nasal Mucosa; Nasal Swab  Result Value Ref Range   MRSA by PCR Next Gen DETECTED (A) NOT DETECTED    Comment: RESULT CALLED TO, READ BACK BY AND VERIFIED WITH: R SARINE,RN'@0617'$  06/16/22 [place] (NOTE) The GeneXpert MRSA Assay (FDA approved for NASAL specimens only), is one component of a comprehensive MRSA colonization surveillance program. It is not intended to diagnose MRSA infection nor to guide or monitor treatment for MRSA infections. Test performance is not FDA approved in patients less than 54 years old. Performed at [facility], [place] [address], [place],  [place] [address]   I-STAT 7, (LYTES, BLD GAS, ICA, H+H)     Status: Abnormal   Collection Time: 06/16/22  4:50 AM  Result Value Ref Range   pH, Arterial 7.247 (L) 7.35 - 7.45   pCO2 arterial 44.0 32 - 48 mmHg   pO2, Arterial 128 (H) 83 - 108 mmHg   Bicarbonate 19.2 (L) 20.0 - 28.0 mmol/L   TCO2 21 (L) 22 - 32 mmol/L   O2 Saturation 98 %   Acid-base deficit 8.0 (H) 0.0 - 2.0 mmol/L   Sodium 137 135 - 145 mmol/L   Potassium 3.6 3.5 - 5.1 mmol/L   Calcium, Ion 1.11 (L) 1.15 - 1.40 mmol/L   HCT 35.0 (L) 36.0 - 46.0 %   Hemoglobin 11.9 (L) 12.0 - 15.0 g/dL   Patient temperature 98.4 F    Collection site RADIAL, ALLEN'S TEST ACCEPTABLE    Drawn by Operator    Sample type ARTERIAL   Ammonia     Status: None   Collection Time: 06/16/22  4:55 AM  Result Value Ref Range   Ammonia 27 9 - 35 umol/L    Comment: Performed at [facility], [place] [address], [place],  [address]  Rapid urine drug screen (hospital performed)     Status: Abnormal   Collection Time: 06/16/22  9:38 AM  Result Value Ref Range   Opiates NONE DETECTED NONE DETECTED   Cocaine POSITIVE (A) NONE DETECTED   Benzodiazepines NONE DETECTED NONE DETECTED   Amphetamines NONE DETECTED NONE DETECTED   Tetrahydrocannabinol NONE DETECTED NONE DETECTED   Barbiturates NONE DETECTED NONE DETECTED    Comment: (  NOTE) DRUG SCREEN FOR MEDICAL PURPOSES ONLY.  IF CONFIRMATION IS NEEDED FOR ANY PURPOSE, NOTIFY LAB WITHIN 5 DAYS.  LOWEST DETECTABLE LIMITS FOR URINE DRUG SCREEN Drug Class                     Cutoff (ng/mL) Amphetamine and metabolites    1000 Barbiturate and metabolites    200 Benzodiazepine                 200 Opiates and metabolites        300 Cocaine and metabolites        300 THC                            50 Performed at [facility], [place] [address] [place], [place] [address]   I-STAT 7, (LYTES, BLD GAS, ICA, H+H)     Status: Abnormal   Collection Time: 06/16/22 12:59 PM  Result Value Ref Range    pH, Arterial 7.229 (L) 7.35 - 7.45   pCO2 arterial 37.2 32 - 48 mmHg   pO2, Arterial 71 (L) 83 - 108 mmHg   Bicarbonate 15.5 (L) 20.0 - 28.0 mmol/L   TCO2 17 (L) 22 - 32 mmol/L   O2 Saturation 91 %   Acid-base deficit 11.0 (H) 0.0 - 2.0 mmol/L   Sodium 138 135 - 145 mmol/L   Potassium 3.4 (L) 3.5 - 5.1 mmol/L   Calcium, Ion 1.26 1.15 - 1.40 mmol/L   HCT 34.0 (L) 36.0 - 46.0 %   Hemoglobin 11.6 (L) 12.0 - 15.0 g/dL   Collection site RADIAL, ALLEN'S TEST ACCEPTABLE    Drawn by RT    Sample type ARTERIAL   Comprehensive metabolic panel     Status: Abnormal   Collection Time: 06/16/22  2:37 PM  Result Value Ref Range   Sodium 138 135 - 145 mmol/L   Potassium 4.5 3.5 - 5.1 mmol/L    Comment: HEMOLYSIS AT THIS LEVEL MAY AFFECT RESULT   Chloride 112 (H) 98 - 111 mmol/L   CO2 18 (L) 22 - 32 mmol/L   Glucose, Bld 275 (H) 70 - 99 mg/dL    Comment: Glucose reference range applies only to samples taken after fasting for at least 8 hours.   BUN 11 6 - 20 mg/dL   Creatinine, Ser 0.84 0.44 - 1.00 mg/dL   Calcium 8.0 (L) 8.9 - 10.3 mg/dL   Total Protein 5.4 (L) 6.5 - 8.1 g/dL   Albumin 2.8 (L) 3.5 - 5.0 g/dL   AST 763 (H) 15 - 41 U/L    Comment: HEMOLYSIS AT THIS LEVEL MAY AFFECT RESULT   ALT 476 (H) 0 - 44 U/L    Comment: HEMOLYSIS AT THIS LEVEL MAY AFFECT RESULT   Alkaline Phosphatase 88 38 - 126 U/L   Total Bilirubin 2.0 (H) 0.3 - 1.2 mg/dL    Comment: HEMOLYSIS AT THIS LEVEL MAY AFFECT RESULT   GFR, Estimated >60 >60 mL/min    Comment: (NOTE) Calculated using the CKD-EPI Creatinine Equation (2021)    Anion gap 8 5 - 15    Comment: Performed at [facility], [place] [address] [place], [place] [address]  Protime-INR     Status: Abnormal   Collection Time: 06/16/22  2:37 PM  Result Value Ref Range   Prothrombin Time 18.0 (H) 11.4 - 15.2 seconds   INR 1.5 (H) 0.8 - 1.2  Comment: (NOTE) INR goal varies based on device and disease states. Performed at [facility],  [place] [address] [place], [place] [address]   Ammonia     Status: Abnormal   Collection Time: 06/16/22  2:37 PM  Result Value Ref Range   Ammonia 48 (H) 9 - 35 umol/L    Comment: Performed at [facility], [place] [address] [place], [place] [address]  Lactic acid, plasma     Status: Abnormal   Collection Time: 06/16/22  2:37 PM  Result Value Ref Range   Lactic Acid, Venous 4.2 (HH) 0.5 - 1.9 mmol/L    Comment: CRITICAL RESULT CALLED TO, READ BACK BY AND VERIFIED WITH O.SIDDIQUI RN [id number] 06/16/22 M[place]ORM[place] K Performed at [facility] [address], [place], [place] [address]   Glucose, capillary     Status: Abnormal   Collection Time: 06/16/22  4:30 PM  Result Value Ref Range   Glucose-Capillary 126 (H) 70 - 99 mg/dL    Comment: Glucose reference range applies only to samples taken after fasting for at least 8 hours.  Lactic acid, plasma     Status: None   Collection Time: 06/16/22  6:26 PM  Result Value Ref Range   Lactic Acid, Venous 1.1 0.5 - 1.9 mmol/L    Comment: Performed at [facility], [place] [address] [place], [place] [address]  Hepatitis panel, acute     Status: None   Collection Time: 06/16/22  6:26 PM  Result Value Ref Range   Hepatitis B Surface Ag NON REACTIVE NON REACTIVE   HCV Ab NON REACTIVE NON REACTIVE    Comment: (NOTE) Nonreactive HCV antibody screen is consistent with no HCV infections,  unless recent infection is suspected or other evidence exists to indicate HCV infection.     Hep A IgM NON REACTIVE NON REACTIVE   Hep B C IgM NON REACTIVE NON REACTIVE    Comment: Performed at [facility], [place] [address] [place], [place] [address]  Glucose, capillary     Status: None   Collection Time: 06/16/22  8:10 PM  Result Value Ref Range   Glucose-Capillary 84 70 - 99 mg/dL    Comment: Glucose reference range applies only to samples taken after fasting for at least 8 hours.  Glucose, capillary     Status: Abnormal   Collection Time: 06/17/22  12:53 AM  Result Value Ref Range   Glucose-Capillary 105 (H) 70 - 99 mg/dL    Comment: Glucose reference range applies only to samples taken after fasting for at least 8 hours.  CBC     Status: Abnormal   Collection Time: 06/17/22  1:03 AM  Result Value Ref Range   WBC 5.3 4.0 - 10.5 K/uL   RBC 3.61 (L) 3.87 - 5.11 MIL/uL   Hemoglobin 11.1 (L) 12.0 - 15.0 g/dL   HCT 32.9 (L) 36.0 - 46.0 %   MCV 91.1 80.0 - 100.0 fL   MCH 30.7 26.0 - 34.0 pg   MCHC 33.7 30.0 - 36.0 g/dL   RDW 16.5 (H) 11.5 - 15.5 %   Platelets 121 (L) 150 - 400 K/uL   nRBC 0.0 0.0 - 0.2 %    Comment: Performed at [facility] [address] [place], [place] [address]  Comprehensive metabolic panel     Status: Abnormal   Collection Time: 06/17/22  1:05 AM  Result Value Ref Range   Sodium 140 135 -  145 mmol/L   Potassium 3.4 (L) 3.5 - 5.1 mmol/L   Chloride 107 98 - 111 mmol/L   CO2 28 22 - 32 mmol/L   Glucose, Bld 102 (H) 70 - 99 mg/dL    Comment: Glucose reference range applies only to samples taken after fasting for at least 8 hours.   BUN 8 6 - 20 mg/dL   Creatinine, Ser 0.43 (L) 0.44 - 1.00 mg/dL   Calcium 8.2 (L) 8.9 - 10.3 mg/dL   Total Protein 5.5 (L) 6.5 - 8.1 g/dL   Albumin 2.8 (L) 3.5 - 5.0 g/dL   AST 886 (H) 15 - 41 U/L   ALT 596 (H) 0 - 44 U/L   Alkaline Phosphatase 94 38 - 126 U/L   Total Bilirubin 1.0 0.3 - 1.2 mg/dL   GFR, Estimated >60 >60 mL/min    Comment: (NOTE) Calculated using the CKD-EPI Creatinine Equation (2021)    Anion gap 5 5 - 15    Comment: Performed at [facility], [place] [address], [place], [place] [address]  Protime-INR     Status: Abnormal   Collection Time: 06/17/22  1:05 AM  Result Value Ref Range   Prothrombin Time 16.3 (H) 11.4 - 15.2 seconds   INR 1.3 (H) 0.8 - 1.2    Comment: (NOTE) INR goal varies based on device and disease states. Performed at [facility], [place] [address], [place], [place] [address]   Ammonia     Status: None   Collection  Time: 06/17/22  1:05 AM  Result Value Ref Range   Ammonia 24 9 - 35 umol/L    Comment: Performed at [facility], [place] [address] [place], [place] [address]  Glucose, capillary     Status: Abnormal   Collection Time: 06/17/22  4:22 AM  Result Value Ref Range   Glucose-Capillary 198 (H) 70 - 99 mg/dL    Comment: Glucose reference range applies only to samples taken after fasting for at least 8 hours.  Glucose, capillary     Status: None   Collection Time: 06/17/22  7:48 AM  Result Value Ref Range   Glucose-Capillary 80 70 - 99 mg/dL    Comment: Glucose reference range applies only to samples taken after fasting for at least 8 hours.    CT HEAD WO CONTRAST (5MM)  Result Date: 06/16/2022 CLINICAL DATA:  Altered mental status, nontraumatic EXAM: CT HEAD WITHOUT CONTRAST TECHNIQUE: Contiguous axial images were obtained from the base of the skull through the vertex without intravenous contrast. RADIATION DOSE REDUCTION: This exam was performed according to the departmental dose-optimization program which includes automated exposure control, adjustment of the mA and/or kV according to patient size and/or use of iterative reconstruction technique. COMPARISON:  Brain MRI 01/16/2022 FINDINGS: Brain: No evidence of acute infarction, hemorrhage, hydrocephalus, extra-axial collection or mass lesion/mass effect. Vascular: No hyperdense vessel or unexpected calcification. Skull: Normal. Negative for fracture or focal lesion. Sinuses/Orbits: No acute finding. IMPRESSION: Stable, negative head CT. Electronically Signed   By: Jorje Guild M.D.   On: 06/16/2022 06:27   DG Shoulder Left Portable  Result Date: 06/16/2022 CLINICAL DATA:  Fall, left shoulder pain EXAM: LEFT SHOULDER COMPARISON:  None Available. FINDINGS: Acute minimally displaced anatomically aligned fracture of the greater tuberosity of the left humeral head again identified with interval development of minimal non bridging callus in  keeping with interval incomplete healing. Normal overall alignment. Glenohumeral joint space is preserved. Acromioclavicular joint space is not  well profiled. Limited evaluation of the left hemithorax is unremarkable. IMPRESSION: 1. Incomplete healing of minimally displaced, anatomically aligned greater tuberosity fracture of the left humeral head. Electronically Signed   By: Fidela Salisbury M.D.   On: 06/16/2022 01:00   CT ABDOMEN PELVIS W CONTRAST  Result Date: 06/15/2022 CLINICAL DATA:  Abdominal pain EXAM: CT ABDOMEN AND PELVIS WITH CONTRAST TECHNIQUE: Multidetector CT imaging of the abdomen and pelvis was performed using the standard protocol following bolus administration of intravenous contrast. RADIATION DOSE REDUCTION: This exam was performed according to the departmental dose-optimization program which includes automated exposure control, adjustment of the mA and/or kV according to patient size and/or use of iterative reconstruction technique. CONTRAST:  20m OMNIPAQUE IOHEXOL 350 MG/ML SOLN COMPARISON:  05/18/2022 FINDINGS: Lower chest: No acute pleural or parenchymal lung disease. Hepatobiliary: No focal liver abnormality is seen. No gallstones, gallbladder wall thickening, or biliary dilatation. Pancreas: Unremarkable. No pancreatic ductal dilatation or surrounding inflammatory changes. Spleen: Normal in size without focal abnormality. Adrenals/Urinary Tract: Adrenal glands are unremarkable. Kidneys are normal, without renal calculi, focal lesion, or hydronephrosis. Bladder is unremarkable. 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 No bowel obstruction or ileus. There is mild mural thickening within distal small bowel in the right lower quadrant, consistent with inflammatory or infectious enteritis. Vascular/Lymphatic: Aortic atherosclerosis. No enlarged abdominal or pelvic lymph nodes. Reproductive: Status post hysterectomy. No adnexal masses. Other: No free fluid or free  intraperitoneal gas. No abdominal wall hernia. The left para umbilical abdominal wall fluid collection seen previously has resolved in the interim. Musculoskeletal: No acute or destructive bony lesions. Chronic T9 compression deformity. Reconstructed images demonstrate no additional findings. IMPRESSION: 1. Mild wall thickening of the distal small bowel in the right lower quadrant, compatible with inflammatory or infectious enteritis. No bowel obstruction or ileus. 2.  Aortic Atherosclerosis (ICD10-I70.0). Electronically Signed   By: MRanda NgoM.D.   On: 06/15/2022 23:38    Review of Systems  Unable to perform ROS: Mental status change   Blood pressure 128/66, pulse (!) 108, temperature 98.2 F (36.8 C), temperature source Axillary, resp. rate (!) 23, height '5\' 2"'$  (1.575 m), weight 48.4 kg, last menstrual period 01/08/2011, SpO2 98 %. Physical Exam Constitutional:      General: She is not in acute distress. HENT:     Head: Normocephalic and atraumatic.     Mouth/Throat:     Mouth: Mucous membranes are moist.  Abdominal:     General: There is no distension.     Palpations: Abdomen is soft.     Tenderness: There is no abdominal tenderness.  Skin:    General: Skin is warm and dry.  Neurological:     General: No focal deficit present.     Mental Status: She is disoriented.   Assessment/Plan: 1) Intentional drug overdose/elevated liver enzymes-agree with NAC and follow LFT's & INR levels. Hepatitis serologies were negative. 2) Polysubstance abuse with cocaine and alcohol. 3) Suicidal ideation/anxiety disorder/depression. 4) Polysubstance abuse. 5) Diabetes with diabetic retinopathy. 6) Hypothyroidism. 7) Hyperlipidemia.  8) Minimally displaced left humeral head.  9) History of tobacco abuse/COPD.  JJuanita Craver12/26/2023, 8:48 AM

## 2022-06-17 NOTE — Progress Notes (Signed)
PCCM INTERVAL PROGRESS NOTE  [name] updated via phone She is requesting daily updates from the medical staff    Georgann Housekeeper, AGACNP-BC [place] for personal pager PCCM on call pager [phone] until 7pm. Please call Elink 7p-7a. [phone]  06/17/2022 7:00 PM

## 2022-06-18 DIAGNOSIS — T50902A Poisoning by unspecified drugs, medicaments and biological substances, intentional self-harm, initial encounter: Secondary | ICD-10-CM

## 2022-06-18 LAB — GLUCOSE, CAPILLARY
Glucose-Capillary: 157 mg/dL — ABNORMAL HIGH (ref 70–99)
Glucose-Capillary: 198 mg/dL — ABNORMAL HIGH (ref 70–99)
Glucose-Capillary: 277 mg/dL — ABNORMAL HIGH (ref 70–99)
Glucose-Capillary: 171 mg/dL — ABNORMAL HIGH (ref 70–99)
Glucose-Capillary: 189 mg/dL — ABNORMAL HIGH (ref 70–99)

## 2022-06-18 LAB — COMPREHENSIVE METABOLIC PANEL
ALT: 1446 U/L — ABNORMAL HIGH (ref 0–44)
AST: 1685 U/L — ABNORMAL HIGH (ref 15–41)
Albumin: 2.7 g/dL — ABNORMAL LOW (ref 3.5–5.0)
Alkaline Phosphatase: 100 U/L (ref 38–126)
Anion gap: 9 (ref 5–15)
BUN: <5 mg/dL — ABNORMAL LOW (ref 6–20)
CO2: 33 mmol/L — ABNORMAL HIGH (ref 22–32)
Calcium: 8.4 mg/dL — ABNORMAL LOW (ref 8.9–10.3)
Chloride: 97 mmol/L — ABNORMAL LOW (ref 98–111)
Creatinine, Ser: 0.47 mg/dL (ref 0.44–1.00)
GFR, Estimated: >60 mL/min (ref >60)
Glucose, Bld: 257 mg/dL — ABNORMAL HIGH (ref 70–99)
Potassium: 3.4 mmol/L — ABNORMAL LOW (ref 3.5–5.1)
Sodium: 139 mmol/L (ref 135–145)
Total Bilirubin: 1.2 mg/dL (ref 0.3–1.2)
Total Protein: 5.5 g/dL — ABNORMAL LOW (ref 6.5–8.1)

## 2022-06-18 LAB — CBC
HCT: 33.3 % — ABNORMAL LOW (ref 36.0–46.0)
Hemoglobin: 11.2 g/dL — ABNORMAL LOW (ref 12.0–15.0)
MCH: 30.7 pg (ref 26.0–34.0)
MCHC: 33.6 g/dL (ref 30.0–36.0)
MCV: 91.2 fL (ref 80.0–100.0)
Platelets: 102 10*3/uL — ABNORMAL LOW (ref 150–400)
RBC: 3.65 MIL/uL — ABNORMAL LOW (ref 3.87–5.11)
RDW: 16.3 % — ABNORMAL HIGH (ref 11.5–15.5)
WBC: 6.1 10*3/uL (ref 4.0–10.5)
nRBC: 0 % (ref 0.0–0.2)

## 2022-06-18 LAB — PROTIME-INR
INR: 1.3 — ABNORMAL HIGH (ref 0.8–1.2)
Prothrombin Time: 16.4 seconds — ABNORMAL HIGH (ref 11.4–15.2)

## 2022-06-18 MED ORDER — FOLIC ACID 1 MG PO TABS
1.0000 mg | ORAL_TABLET | Freq: Every day | ORAL | Status: DC
  Administered 2022-06-19 – 2022-06-20 (×2): 1 mg via ORAL
  Filled 2022-06-18 (×2): qty 1

## 2022-06-18 MED ORDER — POTASSIUM CHLORIDE CRYS ER 20 MEQ PO TBCR
40.0000 meq | EXTENDED_RELEASE_TABLET | Freq: Once | ORAL | Status: AC
  Administered 2022-06-18: 40 meq via ORAL
  Filled 2022-06-18: qty 2

## 2022-06-18 MED ORDER — NICOTINE 14 MG/24HR TD PT24
14.0000 mg | MEDICATED_PATCH | Freq: Every day | TRANSDERMAL | Status: DC
  Administered 2022-06-18 – 2022-06-20 (×3): 14 mg via TRANSDERMAL
  Filled 2022-06-18 (×3): qty 1

## 2022-06-18 MED ORDER — THIAMINE MONONITRATE 100 MG PO TABS
100.0000 mg | ORAL_TABLET | Freq: Every day | ORAL | Status: DC
  Administered 2022-06-18 – 2022-06-20 (×3): 100 mg via ORAL
  Filled 2022-06-18 (×3): qty 1

## 2022-06-18 NOTE — Progress Notes (Signed)
PROGRESS NOTE    [name]  KMQ:[id number] DOB: [birth year] DOA: 06/15/2022 PCP: [name], MD  Outpatient Specialists:     Brief Narrative:  Patient is a 56 year old female, smoker, with past medical history significant for schizophrenia and cocaine abuse.  Patient was found at home by her family laying on floor lethargic after hearing a thump on the floor. She left the house before EMS could arrive. She was later found by family and taken back home. She reported to intentionally take a bunch of tylenol pills and beer and asked her family to call EMS again. Blood pressure was 78/40 in the ER. She reported abdominal pain. CT abd/pelvis showed mild distal small bowel thickening. Her LFTs were elevated (AST 1794, ALT 501) and initial acetaminophen level was 14. She was started on IV fluids and N-acetylcysteine, and poison control contacted. PCCM asked to arrange for admission to ICU.  06/18/2022: Input from the GI team and psychiatric team is highly appreciated.  Blood work done today revealed sodium of 139, potassium of 3.4, glucose of 257, CO2 of 33, AST of 1685, ALT of 1446 and INR of 1.3.  Total bilirubin is 1.2.  Will continue to monitor CMP, INR and blood sugar daily.  Patient be transferred to psychiatric team once medically cleared.  Patient denies any suicidal ideations or plans.  Patient regrets having taken an overdose.  Assessment & Plan:   Principal Problem:   Overdose Active Problems:   Hyperbilirubinemia   Hyperammonemia (HCC)   Pressure injury of skin   Intentional overdose, presumably from acetaminophen -con't NAC since transaminases are worsening   Worsening metabolic acidosis: -Acidosis has resolved. -CO2 is 33. -d/c bicarb gtt  -Continue to monitor    Hyperammonemia: -Resolved -Ammonia is 24 today.  Hyperbilirubinemia: -Resolved.  INR: INR is 1.3.  Concerning for hepatocellular necrosis: -Appreciate GI's management -con't lactulose until  transaminases improved -con't monitoring LFTs & INR   Hypotension from hypovolemia -Resolved.   Persistent tachycardia -Improving.   Acute anemia: -Stable.   Polysubstance abuse with alcohol and cocaine -needs counseling on the importance of cessation   Suicidal ideation with hx of schizoaffective disorder/bipolar type -suicide precautions; still needs 1:1 sitter -appreciate psychiatry's involvement; planning on IP psych admission after she medically stabilizes -cannot leave AMA per Psych; needs IVC if she attempts to leave   Left humeral head minimally displaced greater tuberosity fracture. -sling immobilization on left -OP ortho follow up   Hyperglycemia -SSI PRN -A1c was 6.1%.  Likely, patient is prediabetic.     DVT prophylaxis: SCD Code Status: Full code Family Communication:  Disposition Plan: To the psychiatric unit   Consultants:  Psychiatry team GI. Patient was transferred from ICU team  Procedures:  None  Antimicrobials:  IV ceftriaxone. IV vancomycin.   Subjective: -No suicidal ideations. -No constitutional symptoms.  Objective: Vitals:   06/18/22 1129 06/18/22 1148 06/18/22 1400 06/18/22 1600  BP:  133/88 133/75 (!) 135/95  Pulse:    100  Resp:  20  18  Temp: 98.3 F (36.8 C)   98.5 F (36.9 C)  TempSrc: Oral   Oral  SpO2:  99%  94%  Weight:      Height:        Intake/Output Summary (Last 24 hours) at 06/18/2022 1850 Last data filed at 06/18/2022 1804 Gross per 24 hour  Intake 1200 ml  Output 0 ml  Net 1200 ml   Filed Weights   06/16/22 0330 06/17/22 0652 06/17/22 2056  Weight:  46.9 kg 48.4 kg 50.3 kg    Examination:  General exam: Appears calm and comfortable  Respiratory system: Decreased air entry globally.   Cardiovascular system: S1 & S2 heard. Gastrointestinal system: Abdomen is soft and nontender.  Central nervous system: Alert and oriented. No focal neurological deficits. Extremities: No leg edema.  Data  Reviewed: I have personally reviewed following labs and imaging studies  CBC: Recent Labs  Lab 06/15/22 2118 06/16/22 0403 06/16/22 0450 06/16/22 1259 06/17/22 0103 06/18/22 0922  WBC 12.6* 9.6  --   --  5.3 6.1  NEUTROABS 11.2*  --   --   --   --   --   HGB 12.8 12.8 11.9* 11.6* 11.1* 11.2*  HCT 39.0 38.1 35.0* 34.0* 32.9* 33.3*  MCV 94.0 91.8  --   --  91.1 91.2  PLT 163 192  --   --  121* 381*   Basic Metabolic Panel: Recent Labs  Lab 06/15/22 2118 06/16/22 0403 06/16/22 0450 06/16/22 1259 06/16/22 1437 06/17/22 0105 06/17/22 1007 06/18/22 0922  NA 136 137   < > 138 138 140 136 139  K 4.4 4.2   < > 3.4* 4.5 3.4* 4.2 3.4*  CL 103 107  --   --  112* 107 101 97*  CO2 18* 18*  --   --  18* 28 29 33*  GLUCOSE 109* 142*  --   --  275* 102* 298* 257*  BUN 22* 21*  --   --  11 8 5* <5*  CREATININE 1.04* 0.87  --   --  0.84 0.43* 0.59 0.47  CALCIUM 8.3* 7.8*  --   --  8.0* 8.2* 8.1* 8.4*  MG 2.0 2.0  --   --   --   --   --   --   PHOS  --  5.2*  --   --   --   --   --   --    < > = values in this interval not displayed.   GFR: Estimated Creatinine Clearance: 60.6 mL/min (by C-G formula based on SCr of 0.47 mg/dL). Liver Function Tests: Recent Labs  Lab 06/16/22 0403 06/16/22 1437 06/17/22 0105 06/17/22 1007 06/18/22 0922  AST 1,055* 763* 886* 1,352* 1,685*  ALT 483* 476* 596* 817* 1,446*  ALKPHOS 107 88 94 83 100  BILITOT 1.8* 2.0* 1.0 0.7 1.2  PROT 6.8 5.4* 5.5* 5.5* 5.5*  ALBUMIN 3.4* 2.8* 2.8* 2.8* 2.7*   Recent Labs  Lab 06/15/22 2118  LIPASE 39   Recent Labs  Lab 06/16/22 0455 06/16/22 1437 06/17/22 0105  AMMONIA 27 48* 24   Coagulation Profile: Recent Labs  Lab 06/16/22 0403 06/16/22 1437 06/17/22 0105 06/18/22 0922  INR 1.2 1.5* 1.3* 1.3*   Cardiac Enzymes: No results for input(s): "CKTOTAL", "CKMB", "CKMBINDEX", "TROPONINI" in the last 168 hours. BNP (last 3 results) No results for input(s): "PROBNP" in the last 8760  hours. HbA1C: Recent Labs    06/17/22 0103  HGBA1C 6.1*   CBG: Recent Labs  Lab 06/17/22 1621 06/17/22 2122 06/18/22 0639 06/18/22 1128 06/18/22 1728  GLUCAP 188* 141* 157* 198* 277*   Lipid Profile: No results for input(s): "CHOL", "HDL", "LDLCALC", "TRIG", "CHOLHDL", "LDLDIRECT" in the last 72 hours. Thyroid Function Tests: Recent Labs    06/17/22 0103  TSH 1.061   Anemia Panel: Recent Labs    06/16/22 1826  VITAMINB12 755   Urine analysis:    Component Value Date/Time   COLORURINE COLORLESS (  A) 05/18/2022 1412   APPEARANCEUR CLEAR 05/18/2022 1412   LABSPEC 1.002 (L) 05/18/2022 1412   PHURINE 5.0 05/18/2022 1412   GLUCOSEU NEGATIVE 05/18/2022 1412   HGBUR NEGATIVE 05/18/2022 1412   BILIRUBINUR NEGATIVE 05/18/2022 1412   KETONESUR NEGATIVE 05/18/2022 1412   PROTEINUR NEGATIVE 05/18/2022 1412   UROBILINOGEN 0.2 09/04/2013 0426   NITRITE NEGATIVE 05/18/2022 1412   LEUKOCYTESUR NEGATIVE 05/18/2022 1412   Sepsis Labs: '@LABRCNTIP'$ (procalcitonin:4,lacticidven:4)  ) Recent Results (from the past 240 hour(s))  MRSA Next Gen by PCR, Nasal     Status: Abnormal   Collection Time: 06/16/22  4:05 AM   Specimen: Nasal Mucosa; Nasal Swab  Result Value Ref Range Status   MRSA by PCR Next Gen DETECTED (A) NOT DETECTED Final    Comment: RESULT CALLED TO, READ BACK BY AND VERIFIED WITH: R SARINE,RN'@0617'$  06/16/22 [facility] (NOTE) The GeneXpert MRSA Assay (FDA approved for NASAL specimens only), is one component of a comprehensive MRSA colonization surveillance program. It is not intended to diagnose MRSA infection nor to guide or monitor treatment for MRSA infections. Test performance is not FDA approved in patients less than 13 years old. Performed at [facility], [place] [address], [place], [place] [address]   Culture, blood (Routine X 2) w Reflex to ID Panel     Status: None (Preliminary result)   Collection Time: 06/16/22  4:34 PM   Specimen: BLOOD RIGHT HAND   Result Value Ref Range Status   Specimen Description BLOOD RIGHT HAND  Final   Special Requests   Final    BOTTLES DRAWN AEROBIC AND ANAEROBIC Blood Culture results may not be optimal due to an inadequate volume of blood received in culture bottles   Culture   Final    NO GROWTH 2 DAYS Performed at [facility], [place] [address], [place], [place] [address]    Report Status PENDING  Incomplete  Culture, blood (Routine X 2) w Reflex to ID Panel     Status: None (Preliminary result)   Collection Time: 06/17/22 10:07 AM   Specimen: BLOOD LEFT HAND  Result Value Ref Range Status   Specimen Description BLOOD LEFT HAND  Final   Special Requests IN PEDIATRIC BOTTLE Blood Culture adequate volume  Final   Culture   Final    NO GROWTH 1 DAY Performed at [facility], [place] [address], [place],  [address]    Report Status PENDING  Incomplete         Radiology Studies: No results found.      Scheduled Meds:  Chlorhexidine Gluconate Cloth  6 each Topical Daily   enoxaparin (LOVENOX) injection  40 mg Subcutaneous Q24H   [START ON 26/83/4196] folic acid  1 mg Oral Daily   insulin aspart  1-3 Units Subcutaneous TID AC & HS   lactulose  10 g Oral TID   mupirocin ointment  1 Application Nasal BID   nicotine  14 mg Transdermal Daily   thiamine  100 mg Oral Daily   Continuous Infusions:  acetylcysteine 15 mg/kg/hr (06/18/22 0225)   cefTRIAXone (ROCEPHIN)  IV 2 g (06/18/22 1726)   vancomycin 750 mg (06/18/22 1809)     LOS: 2 days    Time spent: 55 minutes.    Dana Allan, MD  [facility] Pager #: [phone] 7PM-7AM contact night coverage as above

## 2022-06-18 NOTE — Progress Notes (Signed)
[name] called requesting update from GI MD/TRH MD regarding pt's medical status r/t liver function. Her number is [phone]

## 2022-06-18 NOTE — Consult Note (Signed)
[facility] Face-to-Face Psychiatry Consult   Reason for Consult: Suicide attempt by overdose Referring Physician: Dr. Halford Chessman Patient Identification: [name] MRN:  [id number] Principal Diagnosis: Overdose Diagnosis:  Principal Problem:   Overdose Active Problems:   Hyperbilirubinemia   Hyperammonemia (HCC)   Pressure injury of skin   Total Time spent with patient: 1 hour  Subjective:   [name] is a 56 y.o. female patient admitted as a suicide attempt by overdose on Tylenol.  Patient tells meThat she has recently relapsed on cocaine and as a result she has had worsening depressive symptoms in addition to increase in suicidal thoughts.  She stated suicidal thoughts began on Saturday, increased on Sunday.  She states she took 2 of 3 bottles of Tylenol.  She reports depressive symptoms of sadness, isolation, withdrawn, worthlessness, hopelessness, guilty, insomnia, poor appetite.  She reports not eating for 2 days prior to this hospitalization, however had an increase in alcohol intake.  She reports drinking beer only.  She states her mother is currently out of town in [place], and understands her children will not be as present due to the holiday.  While she denies active suicidality, she does endorse feeling depressed and knows that she is not an her baseline.   12/27: Patient is awake, alert and oriented at this time. She appears to be more responsive and engages well with this provider. Currently on interview, the patient. Alert and oriented x4. Patient becomes tearful at times during interview. Patient reports presenting to the emergency department after a suicide attempt via taking 2 bottles of Tylenol. Patient continues to lack insight as she inquires about " why did this happen to me? Does Tylenol do that? "  Patient is unaware of impacts (negative) of alcohol and polysubstance use on her mental health.  We did discuss decreased awareness, impulsivity, and disinhibition that is impacted  with substance use.  She is reminded her last 2 previous suicide attempts she was under the influence of alcohol and polysubstances in which she agrees.  Over the past couple weeks she reports worsening anxiety, mood swings and depression. She reports symptoms of depression including hopelessness, guilt/worthlessness, concentration difficulty, disturbed sleep pattern (sleeping 3 to 4 hours during the day) loss of interest, low energy, decreased appetite, isolative, crying spells deterioration of ADLs and recurrent thoughts of death.  She continues to endorse some passive SI, however shows remorse and regret following her suicide attempt of high lethality.  Provider did call and update daughter.  Recommend inpatient psychiatric hospitalization for crisis stabilization to address symptoms of mood lability including depression/mania, anxiety, alcohol use disorder and suicidal ideations. Patient agrees to voluntary admission at this time.   HPI:  56 yo female smoker with hx of schizophrenia and cocaine abuse was found at home by her family laying on floor lethargic after hearing a thump on the floor. She left the house before EMS could arrive. She was later found by family and taken back home. She reported to intentionally take a bunch of tylenol pills and beer and asked her family to call EMS again. Blood pressure was 78/40 in the ER. She reported abdominal pain. CT abd/pelvis showed mild distal small bowel thickening. Her LFTs were elevated (AST 1794, ALT 501) and initial acetaminophen level was 14. She was started on IV fluids and N-acetylcysteine, and poison control contacted. PCCM asked to arrange for admission to ICU.   Past Psychiatric History: Polysubstance abuse, Depression, Schizoaffective;Bipolar type,  Previous dx's of schizophrenia, bipolar -  Multiple inpatient stays (5) [facility]; last inpatient admission [facility] in 01/2022 after suicide attempt - Follows up at [facility],  previously had ACT team - hx of suicide attempt x2 via overdose as a teen, 12/2021 by OD  Risk to Self:  None at this time Risk to Others:  Denies Prior Inpatient Therapy:  See above Prior Outpatient Therapy:   History of [facility],ACT team.   Past Medical History:  Past Medical History:  Diagnosis Date   Abdominal pain    Accidental drug overdose April 2013   Anxiety    Atrial fibrillation ([place]) 09/29/11   converted spontaneously   Chronic back pain    Chronic knee pain    Chronic nausea    Chronic pain    COPD (chronic obstructive pulmonary disease) ([place])    Depression    Diabetes mellitus    states her doctor took her off all DM meds in past month   Diabetic neuropathy ([place])    Dyspnea    with exertion    GERD (gastroesophageal reflux disease)    Headache(784.0)    migraines    HTN (hypertension)    not on meds since in a year    Hyperlipidemia    Hypothyroidism    not on meds in a while    Mental disorder    Bipolar and schizophrenic   Requires supplemental oxygen    as needed per patient    Schizophrenia ([place])    Schizophrenia, acute ([place]) 11/13/2017   Tobacco abuse     Past Surgical History:  Procedure Laterality Date   ABDOMINAL HYSTERECTOMY     BLADDER SUSPENSION  03/04/2011   Procedure: [facility] PROCEDURE;  Surgeon: Elayne Snare MacDiarmid;  Location: [facility];  Service: Urology;  Laterality: N/A;   BOWEL RESECTION N/A 04/18/2022   Procedure: SMALL BOWEL RESECTION;  Surgeon: Erroll Luna, MD;  Location: [facility];  Service: General;  Laterality: N/A;   CYSTOCELE REPAIR  03/04/2011   Procedure: ANTERIOR REPAIR (CYSTOCELE);  Surgeon: Reece Packer;  Location: [facility];  Service: Urology;  Laterality: N/A;   CYSTOSCOPY  03/04/2011   Procedure: CYSTOSCOPY;  Surgeon: Elayne Snare MacDiarmid;  Location: [facility];  Service: Urology;  Laterality: N/A;   ESOPHAGOGASTRODUODENOSCOPY (EGD) WITH PROPOFOL N/A 05/12/2017   Procedure: ESOPHAGOGASTRODUODENOSCOPY (EGD) WITH PROPOFOL;   Surgeon: Alphonsa Overall, MD;  Location: [facility];  Service: General;  Laterality: N/A;   GASTRIC ROUX-EN-Y N/A 03/25/2016   Procedure: LAPAROSCOPIC ROUX-EN-Y GASTRIC BYPASS WITH UPPER ENDOSCOPY;  Surgeon: Excell Seltzer, MD;  Location: [facility];  Service: General;  Laterality: N/A;   KNEE SURGERY     LAPAROSCOPIC ASSISTED VAGINAL HYSTERECTOMY  03/04/2011   Procedure: LAPAROSCOPIC ASSISTED VAGINAL HYSTERECTOMY;  Surgeon: Cyril Mourning, MD;  Location: [facility];  Service: Gynecology;  Laterality: N/A;   LAPAROTOMY N/A 04/18/2022   Procedure: EXPLORATORY LAPAROTOMY;  Surgeon: Erroll Luna, MD;  Location: [facility];  Service: General;  Laterality: N/A;   LAPAROTOMY N/A 04/24/2022   Procedure: BRING BACK EXPLORATORY LAPAROTOMY;  Surgeon: Georganna Skeans, MD;  Location: [facility];  Service: General;  Laterality: N/A;   Family History:  Family History  Problem Relation Age of Onset   Heart attack Father        45s   Diabetes Mother    Heart disease Mother    Hypertension Mother    Heart attack Sister        74   COPD Other  Breast cancer Neg Hx    Family Psychiatric  History: Denies Social History:  Social History   Substance and Sexual Activity  Alcohol Use Yes   Comment: daily     Social History   Substance and Sexual Activity  Drug Use Not Currently   Types: Cocaine, Marijuana, "Crack" cocaine    Social History   Socioeconomic History   Marital status: Legally Separated    Spouse name: Not on file   Number of children: Not on file   Years of education: Not on file   Highest education level: Not on file  Occupational History   Not on file  Tobacco Use   Smoking status: Every Day    Packs/day: 0.50    Types: Cigarettes   Smokeless tobacco: Never  Vaping Use   Vaping Use: Never used  Substance and Sexual Activity   Alcohol use: Yes    Comment: daily   Drug use: Not Currently    Types: Cocaine, Marijuana, "Crack" cocaine   Sexual activity: Yes    Partners: Male   Other Topics Concern   Not on file  Social History Narrative   Not on file   Social Determinants of Health   Financial Resource Strain: Not on file  Food Insecurity: No Food Insecurity (05/06/2022)   Hunger Vital Sign    Worried About Running Out of Food in the Last Year: Never true    Ran Out of Food in the Last Year: Never true  Transportation Needs: No Transportation Needs (05/06/2022)   PRAPARE - Hydrologist (Medical): No    Lack of Transportation (Non-Medical): No  Physical Activity: Not on file  Stress: Not on file  Social Connections: Moderately Isolated (05/06/2022)   Social Connection and Isolation Panel [NHANES]    Frequency of Communication with Friends and Family: More than three times a week    Frequency of Social Gatherings with Friends and Family: Twice a week    Attends Religious Services: 1 to 4 times per year    Active Member of [employer] or Organizations: No    Attends Archivist Meetings: Never    Marital Status: Separated   Additional Social History:    Allergies:   Allergies  Allergen Reactions   Iron Dextran Shortness Of Breath and Anxiety   Aspirin Nausea And Vomiting and Other (See Comments)    Ok to take tylenol or ibuprofen     Labs:  Results for orders placed or performed during the hospital encounter of 06/15/22 (from the past 48 hour(s))  Glucose, capillary     Status: Abnormal   Collection Time: 06/16/22  4:30 PM  Result Value Ref Range   Glucose-Capillary 126 (H) 70 - 99 mg/dL    Comment: Glucose reference range applies only to samples taken after fasting for at least 8 hours.  Culture, blood (Routine X 2) w Reflex to ID Panel     Status: None (Preliminary result)   Collection Time: 06/16/22  4:34 PM   Specimen: BLOOD RIGHT HAND  Result Value Ref Range   Specimen Description BLOOD RIGHT HAND    Special Requests      BOTTLES DRAWN AEROBIC AND ANAEROBIC Blood Culture results may not be optimal due  to an inadequate volume of blood received in culture bottles   Culture      NO GROWTH < 24 HOURS Performed at [facility] [address], [place], [place] [address]    Report  Status PENDING   Lactic acid, plasma     Status: None   Collection Time: 06/16/22  6:26 PM  Result Value Ref Range   Lactic Acid, Venous 1.1 0.5 - 1.9 mmol/L    Comment: Performed at [facility], [place] [address], [place], [place] [address]  Hepatitis panel, acute     Status: None   Collection Time: 06/16/22  6:26 PM  Result Value Ref Range   Hepatitis B Surface Ag NON REACTIVE NON REACTIVE   HCV Ab NON REACTIVE NON REACTIVE    Comment: (NOTE) Nonreactive HCV antibody screen is consistent with no HCV infections,  unless recent infection is suspected or other evidence exists to indicate HCV infection.     Hep A IgM NON REACTIVE NON REACTIVE   Hep B C IgM NON REACTIVE NON REACTIVE    Comment: Performed at [facility], [place] [address], [place], [place] [address]  Vitamin B12     Status: None   Collection Time: 06/16/22  6:26 PM  Result Value Ref Range   Vitamin B-12 755 180 - 914 pg/mL    Comment: (NOTE) This assay is not validated for testing neonatal or myeloproliferative syndrome specimens for Vitamin B12 levels. Performed at [facility], [place] [address] [place], [place] [address]   Glucose, capillary     Status: None   Collection Time: 06/16/22  8:10 PM  Result Value Ref Range   Glucose-Capillary 84 70 - 99 mg/dL    Comment: Glucose reference range applies only to samples taken after fasting for at least 8 hours.  Glucose, capillary     Status: Abnormal   Collection Time: 06/17/22 12:53 AM  Result Value Ref Range   Glucose-Capillary 105 (H) 70 - 99 mg/dL    Comment: Glucose reference range applies only to samples taken after fasting for at least 8 hours.  Hemoglobin A1c     Status: Abnormal   Collection Time: 06/17/22  1:03 AM  Result Value Ref Range   Hgb A1c MFr Bld  6.1 (H) 4.8 - 5.6 %    Comment: (NOTE)         Prediabetes: 5.7 - 6.4         Diabetes: >6.4         Glycemic control for adults with diabetes: <7.0    Mean Plasma Glucose 128 mg/dL    Comment: (NOTE) Performed At: [facility] [place], [place] [id number] Rush Farmer MD CH:[id number]   CBC     Status: Abnormal   Collection Time: 06/17/22  1:03 AM  Result Value Ref Range   WBC 5.3 4.0 - 10.5 K/uL   RBC 3.61 (L) 3.87 - 5.11 MIL/uL   Hemoglobin 11.1 (L) 12.0 - 15.0 g/dL   HCT 32.9 (L) 36.0 - 46.0 %   MCV 91.1 80.0 - 100.0 fL   MCH 30.7 26.0 - 34.0 pg   MCHC 33.7 30.0 - 36.0 g/dL   RDW 16.5 (H) 11.5 - 15.5 %   Platelets 121 (L) 150 - 400 K/uL   nRBC 0.0 0.0 - 0.2 %    Comment: Performed at [facility], [place] [address] [place], [place] [address]  TSH     Status: None   Collection Time: 06/17/22  1:03 AM  Result Value Ref Range   TSH 1.061 0.350 - 4.500 uIU/mL    Comment: Performed by a 3rd Generation assay with a functional sensitivity of <=0.01 uIU/mL.  Performed at [facility], [place] [address] [place], [place] [address]   Comprehensive metabolic panel     Status: Abnormal   Collection Time: 06/17/22  1:05 AM  Result Value Ref Range   Sodium 140 135 - 145 mmol/L   Potassium 3.4 (L) 3.5 - 5.1 mmol/L   Chloride 107 98 - 111 mmol/L   CO2 28 22 - 32 mmol/L   Glucose, Bld 102 (H) 70 - 99 mg/dL    Comment: Glucose reference range applies only to samples taken after fasting for at least 8 hours.   BUN 8 6 - 20 mg/dL   Creatinine, Ser 0.43 (L) 0.44 - 1.00 mg/dL   Calcium 8.2 (L) 8.9 - 10.3 mg/dL   Total Protein 5.5 (L) 6.5 - 8.1 g/dL   Albumin 2.8 (L) 3.5 - 5.0 g/dL   AST 886 (H) 15 - 41 U/L   ALT 596 (H) 0 - 44 U/L   Alkaline Phosphatase 94 38 - 126 U/L   Total Bilirubin 1.0 0.3 - 1.2 mg/dL   GFR, Estimated >60 >60 mL/min    Comment: (NOTE) Calculated using the CKD-EPI Creatinine Equation (2021)    Anion gap 5 5 - 15    Comment:  Performed at [facility], [place] [address], [place], [place] [address]  Protime-INR     Status: Abnormal   Collection Time: 06/17/22  1:05 AM  Result Value Ref Range   Prothrombin Time 16.3 (H) 11.4 - 15.2 seconds   INR 1.3 (H) 0.8 - 1.2    Comment: (NOTE) INR goal varies based on device and disease states. Performed at [facility], [place] [address], [place], [place] [address]   Ammonia     Status: None   Collection Time: 06/17/22  1:05 AM  Result Value Ref Range   Ammonia 24 9 - 35 umol/L    Comment: Performed at [facility], [place] [address], [place], [place] [address]  Glucose, capillary     Status: Abnormal   Collection Time: 06/17/22  4:22 AM  Result Value Ref Range   Glucose-Capillary 198 (H) 70 - 99 mg/dL    Comment: Glucose reference range applies only to samples taken after fasting for at least 8 hours.  Glucose, capillary     Status: None   Collection Time: 06/17/22  7:48 AM  Result Value Ref Range   Glucose-Capillary 80 70 - 99 mg/dL    Comment: Glucose reference range applies only to samples taken after fasting for at least 8 hours.  Comprehensive metabolic panel     Status: Abnormal   Collection Time: 06/17/22 10:07 AM  Result Value Ref Range   Sodium 136 135 - 145 mmol/L   Potassium 4.2 3.5 - 5.1 mmol/L   Chloride 101 98 - 111 mmol/L   CO2 29 22 - 32 mmol/L   Glucose, Bld 298 (H) 70 - 99 mg/dL    Comment: Glucose reference range applies only to samples taken after fasting for at least 8 hours.   BUN 5 (L) 6 - 20 mg/dL   Creatinine, Ser 0.59 0.44 - 1.00 mg/dL   Calcium 8.1 (L) 8.9 - 10.3 mg/dL   Total Protein 5.5 (L) 6.5 - 8.1 g/dL   Albumin 2.8 (L) 3.5 - 5.0 g/dL   AST 1,352 (H) 15 - 41 U/L   ALT 817 (H) 0 - 44 U/L   Alkaline Phosphatase 83 38 - 126 U/L   Total Bilirubin 0.7  0.3 - 1.2 mg/dL   GFR, Estimated >60 >60 mL/min    Comment: (NOTE) Calculated using the CKD-EPI Creatinine Equation (2021)    Anion gap 6 5 - 15    Comment: Performed  at [facility] [address] [place], [place] [address]  Glucose, capillary     Status: Abnormal   Collection Time: 06/17/22 11:51 AM  Result Value Ref Range   Glucose-Capillary 150 (H) 70 - 99 mg/dL    Comment: Glucose reference range applies only to samples taken after fasting for at least 8 hours.  Glucose, capillary     Status: Abnormal   Collection Time: 06/17/22  4:21 PM  Result Value Ref Range   Glucose-Capillary 188 (H) 70 - 99 mg/dL    Comment: Glucose reference range applies only to samples taken after fasting for at least 8 hours.   Comment 1 Notify RN    Comment 2 Document in Chart   Glucose, capillary     Status: Abnormal   Collection Time: 06/17/22  9:22 PM  Result Value Ref Range   Glucose-Capillary 141 (H) 70 - 99 mg/dL    Comment: Glucose reference range applies only to samples taken after fasting for at least 8 hours.   Comment 1 Notify RN    Comment 2 Document in Chart   Glucose, capillary     Status: Abnormal   Collection Time: 06/18/22  6:39 AM  Result Value Ref Range   Glucose-Capillary 157 (H) 70 - 99 mg/dL    Comment: Glucose reference range applies only to samples taken after fasting for at least 8 hours.  Comprehensive metabolic panel     Status: Abnormal   Collection Time: 06/18/22  9:22 AM  Result Value Ref Range   Sodium 139 135 - 145 mmol/L   Potassium 3.4 (L) 3.5 - 5.1 mmol/L   Chloride 97 (L) 98 - 111 mmol/L   CO2 33 (H) 22 - 32 mmol/L   Glucose, Bld 257 (H) 70 - 99 mg/dL    Comment: Glucose reference range applies only to samples taken after fasting for at least 8 hours.   BUN <5 (L) 6 - 20 mg/dL   Creatinine, Ser 0.47 0.44 - 1.00 mg/dL   Calcium 8.4 (L) 8.9 - 10.3 mg/dL   Total Protein 5.5 (L) 6.5 - 8.1 g/dL   Albumin 2.7 (L) 3.5 - 5.0 g/dL   AST 1,685 (H) 15 - 41 U/L   ALT 1,446 (H) 0 - 44 U/L   Alkaline Phosphatase 100 38 - 126 U/L   Total Bilirubin 1.2 0.3 - 1.2 mg/dL   GFR, Estimated >60 >60 mL/min    Comment:  (NOTE) Calculated using the CKD-EPI Creatinine Equation (2021)    Anion gap 9 5 - 15    Comment: Performed at [facility], [place] [address], [place], [place] [address]  Protime-INR     Status: Abnormal   Collection Time: 06/18/22  9:22 AM  Result Value Ref Range   Prothrombin Time 16.4 (H) 11.4 - 15.2 seconds   INR 1.3 (H) 0.8 - 1.2    Comment: (NOTE) INR goal varies based on device and disease states. Performed at [facility], [place] [address], [place] [address]   CBC     Status: Abnormal   Collection Time: 06/18/22  9:22 AM  Result Value Ref Range   WBC 6.1 4.0 - 10.5 K/uL   RBC 3.65 (L) 3.87 - 5.11 MIL/uL  Hemoglobin 11.2 (L) 12.0 - 15.0 g/dL   HCT 33.3 (L) 36.0 - 46.0 %   MCV 91.2 80.0 - 100.0 fL   MCH 30.7 26.0 - 34.0 pg   MCHC 33.6 30.0 - 36.0 g/dL   RDW 16.3 (H) 11.5 - 15.5 %   Platelets 102 (L) 150 - 400 K/uL   nRBC 0.0 0.0 - 0.2 %    Comment: Performed at [facility] [address], [place], [place] [address]  Glucose, capillary     Status: Abnormal   Collection Time: 06/18/22 11:28 AM  Result Value Ref Range   Glucose-Capillary 198 (H) 70 - 99 mg/dL    Comment: Glucose reference range applies only to samples taken after fasting for at least 8 hours.    Current Facility-Administered Medications  Medication Dose Route Frequency Provider Last Rate Last Admin   acetylcysteine (ACETADOTE) 18,000 mg in dextrose 5 % 590 mL (30.5085 mg/mL) infusion  15 mg/kg/hr Intravenous Continuous Plunkett, Whitney, MD 21.2 mL/hr at 06/18/22 0225 15 mg/kg/hr at 06/18/22 0225   cefTRIAXone (ROCEPHIN) 2 g in sodium chloride 0.9 % 100 mL IVPB  2 g Intravenous Q24H Noemi Chapel P, DO 200 mL/hr at 06/17/22 1702 2 g at 06/17/22 1702   Chlorhexidine Gluconate Cloth 2 % PADS 6 each  6 each Topical Daily Chesley Mires, MD   6 each at 06/18/22 0700   enoxaparin (LOVENOX) injection 40 mg  40 mg Subcutaneous Q24H Noemi Chapel P, DO   40 mg at 06/18/22 1503   fentaNYL  (SUBLIMAZE) injection 50 mcg  50 mcg Intravenous Q2H PRN Chesley Mires, MD   50 mcg at 06/18/22 1017   [START ON 83/41/9622] folic acid (FOLVITE) tablet 1 mg  1 mg Oral Daily Benetta Spar D, RPH       haloperidol (HALDOL) tablet 2 mg  2 mg Oral Q6H PRN Suella Broad, FNP   2 mg at 06/17/22 0947   Or   haloperidol lactate (HALDOL) injection 2 mg  2 mg Intramuscular Q6H PRN Suella Broad, FNP   2 mg at 06/16/22 2102   insulin aspart (novoLOG) injection 1-3 Units  1-3 Units Subcutaneous TID AC & HS Noemi Chapel P, DO   2 Units at 06/18/22 1143   lactulose (CHRONULAC) 10 GM/15ML solution 10 g  10 g Oral TID Einar Grad, RPH   10 g at 06/18/22 2979   LORazepam (ATIVAN) injection 2 mg  2 mg Intravenous Q4H PRN Chesley Mires, MD   2 mg at 06/17/22 0442   mupirocin ointment (BACTROBAN) 2 % 1 Application  1 Application Nasal BID Chesley Mires, MD   1 Application at 89/21/19 0959   nicotine (NICODERM CQ - dosed in mg/24 hours) patch 14 mg  14 mg Transdermal Daily Dana Allan I, MD   14 mg at 06/18/22 1502   ondansetron (ZOFRAN) injection 4 mg  4 mg Intravenous Q6H PRN Chesley Mires, MD   4 mg at 06/16/22 1640   Oral care mouth rinse  15 mL Mouth Rinse PRN Chesley Mires, MD       potassium chloride SA (KLOR-CON M) CR tablet 40 mEq  40 mEq Oral Once Dana Allan I, MD       thiamine (VITAMIN B1) tablet 100 mg  100 mg Oral Daily Donnamae Jude, RPH   100 mg at 06/18/22 0958   vancomycin (VANCOREADY) IVPB 750 mg/150 mL  750 mg Intravenous Q24H Einar Grad, RPH 150 mL/hr at  06/17/22 1807 750 mg at 06/17/22 1807    Musculoskeletal: Strength & Muscle Tone: within normal limits Gait & Station: normal Patient leans: N/A  Psychiatric Specialty Exam:  Presentation  General Appearance:  Appropriate for Environment; Casual  Eye Contact: Fair  Speech: Clear and Coherent; Normal Rate  Speech Volume: Normal  Handedness: Right   Mood and Affect   Mood: Depressed  Affect: Congruent; Appropriate   Thought Process  Thought Processes: Coherent; Linear  Descriptions of Associations:Intact  Orientation:Full (Time, Place and Person)  Thought Content:Logical  History of Schizophrenia/Schizoaffective disorder:No data recorded Duration of Psychotic Symptoms:No data recorded Hallucinations:Hallucinations: None  Ideas of Reference:None  Suicidal Thoughts:Suicidal Thoughts: No  Homicidal Thoughts:Homicidal Thoughts: No   Sensorium  Memory: Immediate Fair; Recent Fair; Remote Fair  Judgment: Fair  Insight: Fair   Community education officer  Concentration: Fair  Attention Span: Fair  Recall: [place] of Knowledge: Good  Language: Good   Psychomotor Activity  Psychomotor Activity: Psychomotor Activity: Normal   Assets  Assets: Social Support; Physical Health; Financial Resources/Insurance; Desire for Improvement; Resilience   Sleep  Sleep: Sleep: Good   Physical Exam: Physical Exam Vitals and nursing note reviewed.  Constitutional:      General: She is awake.     Appearance: Normal appearance. She is well-developed and normal weight.  HENT:     Head: Normocephalic.     Mouth/Throat:     Comments: hoarseness Cardiovascular:     Rate and Rhythm: Normal rate.  Skin:    General: Skin is warm.     Capillary Refill: Capillary refill takes less than 2 seconds.  Neurological:     General: No focal deficit present.     Mental Status: She is alert and oriented to person, place, and time. Mental status is at baseline.  Psychiatric:        Attention and Perception: Attention and perception normal.        Mood and Affect: Mood is depressed.        Speech: Speech normal.        Behavior: Behavior normal. Behavior is cooperative.        Thought Content: Thought content normal.        Cognition and Memory: Cognition normal.        Judgment: Judgment normal.    Review of Systems  HENT: Negative.     Eyes: Negative.   Respiratory: Negative.    Cardiovascular: Negative.   Genitourinary: Negative.   Musculoskeletal: Negative.   Skin: Negative.   Neurological: Negative.   Endo/Heme/Allergies: Negative.   Psychiatric/Behavioral:  Positive for depression, substance abuse and suicidal ideas. Negative for hallucinations and memory loss. The patient is nervous/anxious and has insomnia.   All other systems reviewed and are negative.  Blood pressure 133/75, pulse 98, temperature 98.3 F (36.8 C), temperature source Oral, resp. rate 20, height '5\' 2"'$  (1.575 m), weight 50.3 kg, last menstrual period 01/08/2011, SpO2 99 %. Body mass index is 20.28 kg/m.  Treatment Plan Summary: Daily contact with patient to assess and evaluate symptoms and progress in treatment, Medication management, and Plan    -Continue delirium precautions, patient on day for hospitalization, increased risk for developing delirium associated with, ICU, and other medical comorbidities.  -Continue current treatment per recommendations for primary team in gastroenterology.  There do not appear to be concerns regarding acute liver injury, Haldol remains most effective psychotropic medication to help manage delirium, agitation, aggression.  Patient may also benefit from starting phenobarbital if  she continues to remain agitated and aggressive.  Thus far she has received 2 doses of as needed Haldol, no current documentation in the system as to why this medication was received, however after speaking with bedside nurse patient remains restless, continues to attempt to get out of bed.  -Continue CIWAA and Lorazepam detox protocol -Will schedule Haldol '2mg'$  po/IM q8 hr for agitation.  -Will monitor Qtc for prolongation. EKG obtained on 12/25; 469 -Patient is wanting to remain in a hospital under voluntary agreement, in the event patient attempts to leave will need to be placed under IVC at that time.  This has been communicated with  primary team and nursing staff.  Patient has a history of leaving AMA.  Disposition: Recommend psychiatric Inpatient admission when medically cleared.  Suella Broad, FNP 06/18/2022 3:44 PM

## 2022-06-18 NOTE — Progress Notes (Addendum)
Subjective: Since I last evaluated the patient, clinically there seems to be no significant change.  Even though her transaminases have increased from an AST of [id number] and ALT of 8 1 7-14 46 INR is 1.3. Patient claims she feels well and denies having any headaches or blurred vision. She claims she has had some mild nausea this afternoon but that seems to be improved after she had her dinner. She initially denied having any abdominal pain but as I was walking out of the room she said she had some abdominal discomfort and wanted something done about it. She seems comfortable.  She she could tell me that the year is 2023 and we are in the month of December but she could not remember the day day of the week; she said Trump was president of the [place]. She had an attentive sitter in the room engaging her.  Objective: Vital signs in last 24 hours: Temp:  [97.6 F (36.4 C)-101.4 F (38.6 C)] 98.3 F (36.8 C) (12/27 1129) Pulse Rate:  [91-115] 98 (12/27 1000) Resp:  [17-20] 20 (12/27 1148) BP: (95-163)/(72-105) 133/88 (12/27 1148) SpO2:  [91 %-99 %] 99 % (12/27 1148) Weight:  [50.3 kg] 50.3 kg (12/26 2056) Last BM Date : 06/17/22  Intake/Output from previous day: 12/26 0701 - 12/27 0700 In: 1059.9 [P.O.:360; I.V.:479.3; IV Piggyback:220.7] Out: 1700 [Urine:1700] Intake/Output this shift: Total I/O In: 600 [P.O.:600] Out: -   General appearance: alert, cooperative, appears stated age, and no distress Resp: clear to auscultation bilaterally Cardio: regular rate and rhythm, S1, S2 normal, no murmur, click, rub or gallop GI: soft, non-tender; bowel sounds normal; no masses,  no organomegaly Extremities: extremities normal, atraumatic, no cyanosis or edema  Lab Results: Recent Labs    06/16/22 0403 06/16/22 0450 06/16/22 1259 06/17/22 0103 06/18/22 0922  WBC 9.6  --   --  5.3 6.1  HGB 12.8   < > 11.6* 11.1* 11.2*  HCT 38.1   < > 34.0* 32.9* 33.3*  PLT 192  --   --  121* 102*    < > = values in this interval not displayed.   BMET Recent Labs    06/17/22 0105 06/17/22 1007 06/18/22 0922  NA 140 136 139  K 3.4* 4.2 3.4*  CL 107 101 97*  CO2 28 29 33*  GLUCOSE 102* 298* 257*  BUN 8 5* <5*  CREATININE 0.43* 0.59 0.47  CALCIUM 8.2* 8.1* 8.4*   LFT Recent Labs    06/18/22 0922  PROT 5.5*  ALBUMIN 2.7*  AST 1,685*  ALT 1,446*  ALKPHOS 100  BILITOT 1.2   PT/INR Recent Labs    06/17/22 0105 06/18/22 0922  LABPROT 16.3* 16.4*  INR 1.3* 1.3*   Hepatitis Panel Recent Labs    06/16/22 1826  HEPBSAG NON REACTIVE  HCVAB NON REACTIVE  HEPAIGM NON REACTIVE  HEPBIGM NON REACTIVE   Studies/Results: No results found.  Medications: I have reviewed the patient's current medications. Prior to Admission:  Medications Prior to Admission  Medication Sig Dispense Refill Last Dose   albuterol (PROAIR HFA) 108 (90 Base) MCG/ACT inhaler Inhale 2 puffs into the lungs every 6 (six) hours as needed for wheezing or shortness of breath.   Past Week   naloxone (NARCAN) nasal spray 4 mg/0.1 mL Place 1 spray into the nose once.   UNKNOWN   hydrOXYzine (VISTARIL) 25 MG capsule Take 50 mg by mouth 2 (two) times daily. (Patient not taking: Reported on 06/16/2022)  Not Taking   ibuprofen (ADVIL) 800 MG tablet Take 800 mg by mouth every 6 (six) hours as needed for moderate pain. (Patient not taking: Reported on 06/16/2022)   Not Taking   PARoxetine (PAXIL) 10 MG tablet Take 10 mg by mouth daily. (Patient not taking: Reported on 06/16/2022)   Not Taking   QUEtiapine (SEROQUEL) 400 MG tablet Take 400 mg by mouth at bedtime. (Patient not taking: Reported on 06/16/2022)   Not Taking   traZODone (DESYREL) 50 MG tablet Take 50 mg by mouth at bedtime. (Patient not taking: Reported on 06/16/2022)   Not Taking   Scheduled:  Chlorhexidine Gluconate Cloth  6 each Topical Daily   enoxaparin (LOVENOX) injection  40 mg Subcutaneous Q24H   [START ON 13/14/3888] folic acid  1 mg Oral  Daily   insulin aspart  1-3 Units Subcutaneous TID AC & HS   lactulose  10 g Oral TID   mupirocin ointment  1 Application Nasal BID   nicotine  14 mg Transdermal Daily   thiamine  100 mg Oral Daily   Continuous:  acetylcysteine 15 mg/kg/hr (06/18/22 0225)   cefTRIAXone (ROCEPHIN)  IV 2 g (06/18/22 1726)   vancomycin 750 mg (06/17/22 1807)   LNZ:VJKQASUO (SUBLIMAZE) injection, haloperidol **OR** haloperidol lactate, LORazepam, ondansetron, mouth rinse  Assessment/Plan: 1) Intentional Tylenol overdose on N-acetylcysteine-liver function seem to be worsening but synthetic function seems to be within normal limits. My hope is the LFTs will peak before the start to decrease gradually over the next few days.  2) History of alcohol and cocaine abuse she receiving lactulose 3 times a day along with thiamine. 3) Suicidal ideation with anxiety and depression/schizoaffective-bipolar disorder-appreciate psychiatric consult. 4) Diabetes with diabetic retinopathy. 5) History of tobacco abuse/COPD. 6) Hypothyroidism.  7) Hyperlipidemia.  LOS: 2 days   [name] 06/18/2022, 12:40 PM

## 2022-06-19 DIAGNOSIS — T50902A Poisoning by unspecified drugs, medicaments and biological substances, intentional self-harm, initial encounter: Secondary | ICD-10-CM | POA: Diagnosis not present

## 2022-06-19 LAB — GLUCOSE, CAPILLARY
Glucose-Capillary: 135 mg/dL — ABNORMAL HIGH (ref 70–99)
Glucose-Capillary: 193 mg/dL — ABNORMAL HIGH (ref 70–99)
Glucose-Capillary: 217 mg/dL — ABNORMAL HIGH (ref 70–99)
Glucose-Capillary: 133 mg/dL — ABNORMAL HIGH (ref 70–99)

## 2022-06-19 LAB — COMPREHENSIVE METABOLIC PANEL
ALT: 1088 U/L — ABNORMAL HIGH (ref 0–44)
AST: 539 U/L — ABNORMAL HIGH (ref 15–41)
Albumin: 2.9 g/dL — ABNORMAL LOW (ref 3.5–5.0)
Alkaline Phosphatase: 98 U/L (ref 38–126)
Anion gap: 10 (ref 5–15)
BUN: 5 mg/dL — ABNORMAL LOW (ref 6–20)
CO2: 31 mmol/L (ref 22–32)
Calcium: 8.6 mg/dL — ABNORMAL LOW (ref 8.9–10.3)
Chloride: 99 mmol/L (ref 98–111)
Creatinine, Ser: 0.39 mg/dL — ABNORMAL LOW (ref 0.44–1.00)
GFR, Estimated: >60 mL/min (ref >60)
Glucose, Bld: 135 mg/dL — ABNORMAL HIGH (ref 70–99)
Potassium: 3.5 mmol/L (ref 3.5–5.1)
Sodium: 140 mmol/L (ref 135–145)
Total Bilirubin: 0.9 mg/dL (ref 0.3–1.2)
Total Protein: 5.9 g/dL — ABNORMAL LOW (ref 6.5–8.1)

## 2022-06-19 LAB — PROTIME-INR
INR: 1.2 (ref 0.8–1.2)
Prothrombin Time: 15.2 seconds (ref 11.4–15.2)

## 2022-06-19 LAB — HEMOGLOBIN A1C
Hgb A1c MFr Bld: 6.1 % — ABNORMAL HIGH (ref 4.8–5.6)
Mean Plasma Glucose: 128 mg/dL

## 2022-06-19 MED ORDER — OXYCODONE HCL 5 MG PO TABS
5.0000 mg | ORAL_TABLET | Freq: Four times a day (QID) | ORAL | Status: DC | PRN
  Administered 2022-06-19: 5 mg via ORAL
  Filled 2022-06-19: qty 1

## 2022-06-19 MED ORDER — SODIUM CHLORIDE 0.9 % IV SOLN
12.5000 mg | Freq: Once | INTRAVENOUS | Status: AC
  Administered 2022-06-19: 12.5 mg via INTRAVENOUS
  Filled 2022-06-19: qty 0.5

## 2022-06-19 NOTE — Progress Notes (Signed)
Subjective: No acute events.  She complains about her left shoulder pain from the fracture and back pain.  Objective: Vital signs in last 24 hours: Temp:  [97.6 F (36.4 C)-99 F (37.2 C)] 99 F (37.2 C) (12/28 1500) Pulse Rate:  [89-100] 93 (12/28 1500) Resp:  [18-20] 18 (12/28 1500) BP: (123-158)/(79-93) 158/93 (12/28 1500) SpO2:  [93 %-99 %] 95 % (12/28 1500) Weight:  [48.4 kg] 48.4 kg (12/28 1100) Last BM Date : 06/19/22 (per pt)  Intake/Output from previous day: 12/27 0701 - 12/28 0700 In: 2441.9 [P.O.:1200; I.V.:891.9; IV Piggyback:350] Out: 200 [Urine:200] Intake/Output this shift: Total I/O In: 124.8 [I.V.:124.8] Out: -   General appearance: alert and no distress GI: soft, non-tender; bowel sounds normal; no masses,  no organomegaly  Lab Results: Recent Labs    06/17/22 0103 06/18/22 0922  WBC 5.3 6.1  HGB 11.1* 11.2*  HCT 32.9* 33.3*  PLT 121* 102*   BMET Recent Labs    06/17/22 1007 06/18/22 0922 06/19/22 0109  NA 136 139 140  K 4.2 3.4* 3.5  CL 101 97* 99  CO2 29 33* 31  GLUCOSE 298* 257* 135*  BUN 5* <5* 5*  CREATININE 0.59 0.47 0.39*  CALCIUM 8.1* 8.4* 8.6*   LFT Recent Labs    06/19/22 0109  PROT 5.9*  ALBUMIN 2.9*  AST 539*  ALT 1,088*  ALKPHOS 98  BILITOT 0.9   PT/INR Recent Labs    06/18/22 0922 06/19/22 0109  LABPROT 16.4* 15.2  INR 1.3* 1.2   Hepatitis Panel Recent Labs    06/16/22 1826  HEPBSAG NON REACTIVE  HCVAB NON REACTIVE  HEPAIGM NON REACTIVE  HEPBIGM NON REACTIVE   C-Diff No results for input(s): "CDIFFTOX" in the last 72 hours. Fecal Lactopherrin No results for input(s): "FECLLACTOFRN" in the last 72 hours.  Studies/Results: No results found.  Medications: Scheduled:  Chlorhexidine Gluconate Cloth  6 each Topical Daily   enoxaparin (LOVENOX) injection  40 mg Subcutaneous G92J   folic acid  1 mg Oral Daily   insulin aspart  1-3 Units Subcutaneous TID AC & HS   lactulose  10 g Oral TID   mupirocin  ointment  1 Application Nasal BID   nicotine  14 mg Transdermal Daily   thiamine  100 mg Oral Daily   Continuous:  acetylcysteine 15 mg/kg/hr (06/19/22 1112)   cefTRIAXone (ROCEPHIN)  IV Stopped (06/18/22 1759)   promethazine (PHENERGAN) injection (IM or IVPB) 12.5 mg (06/19/22 1616)   vancomycin Stopped (06/18/22 1909)    Assessment/Plan: 1) Acute liver injury from intentional Tylenol overdose - recovering. 2) Suicidal ideation. 3) Left shoulder fracture.   The patient's liver enzymes continue to improve and it is anticipated that they will normalize.  Her synthetic function recovered.  Her mentation is A&O x 3 and she does not exhibit asterixis.    Plan: 1) Continue to trend liver enzymes. 2) Check INR for tomorrow. 3) Ambulate as much as possible. 4) Signing off.  Call with any questions.    LOS: 3 days   [name] D 06/19/2022, 4:18 PM

## 2022-06-19 NOTE — Care Management Important Message (Signed)
Important Message  Patient Details  Name: [name] MRN: [id number] Date of Birth: 04/11/1966   Medicare Important Message Given:  Yes     Shelda Altes 06/19/2022, 9:08 AM

## 2022-06-19 NOTE — Progress Notes (Signed)
PROGRESS NOTE    HELENE BERNSTEIN  ZOX:[id number] DOB: [birth year] DOA: 06/15/2022 PCP: [name], MD  Outpatient Specialists:     Brief Narrative:  Patient is a 56 year old female, smoker, with past medical history significant for schizophrenia and cocaine abuse.  Patient was found at home by her family laying on floor lethargic after hearing a thump on the floor. She left the house before EMS could arrive. She was later found by family and taken back home. She reported to intentionally take a bunch of tylenol pills and beer and asked her family to call EMS again. Blood pressure was 78/40 in the ER. She reported abdominal pain. CT abd/pelvis showed mild distal small bowel thickening. Her LFTs were elevated (AST 1794, ALT 501) and initial acetaminophen level was 14. She was started on IV fluids and N-acetylcysteine, and poison control contacted. PCCM asked to arrange for admission to ICU.  06/18/2022: Input from the GI team and psychiatric team is highly appreciated.  Blood work done today revealed sodium of 139, potassium of 3.4, glucose of 257, CO2 of 33, AST of 1685, ALT of 1446 and INR of 1.3.  Total bilirubin is 1.2.  Will continue to monitor CMP, INR and blood sugar daily.  Patient be transferred to psychiatric team once medically cleared.  Patient denies any suicidal ideations or plans.  Patient regrets having taken an overdose.  06/19/2022: LFTs improving.  INR is 1.2.  Input from the poison control and GI is highly appreciated.  GI team to address: Patient can be transferred to the psychiatric floor.  As per collateral information, the pussy unit has advised discontinuing acetylcysteine.  Assessment & Plan:   Principal Problem:   Overdose Active Problems:   Hyperbilirubinemia   Hyperammonemia (HCC)   Pressure injury of skin   Intentional overdose, presumably from acetaminophen -LFTs this improving. -INR down to 1.2. -Poison control has advised discontinuing acetylcysteine.      Worsening metabolic acidosis: -Acidosis has resolved. -CO2 is 31 today.   Hyperammonemia: -Resolved  Hyperbilirubinemia: -Resolved.  INR: INR is 1.2.  Concerning for hepatocellular necrosis: -Appreciate GI's management -LFTs are improving. -INR is down to 1.2. -Continue to monitor.   Hypotension from hypovolemia -Resolved.   Persistent tachycardia -Improving.   Acute anemia: -Stable.   Polysubstance abuse with alcohol and cocaine -needs counseling on the importance of cessation   Suicidal ideation with hx of schizoaffective disorder/bipolar type -suicide precautions; still needs 1:1 sitter -appreciate psychiatry's involvement; planning on IP psych admission after she medically stabilizes -cannot leave AMA per Psych; needs IVC if she attempts to leave   Left humeral head minimally displaced greater tuberosity fracture. -sling immobilization on left -OP ortho follow up   Hyperglycemia -SSI PRN -A1c was 6.1%.  Likely, patient is prediabetic.     DVT prophylaxis: SCD Code Status: Full code Family Communication:  Disposition Plan: To the psychiatric unit   Consultants:  Psychiatry team GI. Patient was transferred from ICU team  Procedures:  None  Antimicrobials:  IV ceftriaxone. IV vancomycin.   Subjective: -No suicidal ideations. -No constitutional symptoms.  Objective: Vitals:   06/19/22 0800 06/19/22 1100 06/19/22 1200 06/19/22 1500  BP: (!) 147/82  (!) 156/90 (!) 158/93  Pulse: 93  89 93  Resp: '19  19 18  '$ Temp: 98.2 F (36.8 C)  98.7 F (37.1 C) 99 F (37.2 C)  TempSrc: Oral  Oral Oral  SpO2: 99%  95% 95%  Weight:  48.4 kg    Height:  Intake/Output Summary (Last 24 hours) at 06/19/2022 1756 Last data filed at 06/19/2022 1112 Gross per 24 hour  Intake 1486.63 ml  Output 200 ml  Net 1286.63 ml    Filed Weights   06/17/22 0652 06/17/22 2056 06/19/22 1100  Weight: 48.4 kg 50.3 kg 48.4 kg    Examination:  General  exam: Appears calm and comfortable  Respiratory system: Decreased air entry globally.   Cardiovascular system: S1 & S2 heard. Gastrointestinal system: Abdomen is soft and nontender.  Central nervous system: Alert and oriented. No focal neurological deficits. Extremities: No leg edema.  Data Reviewed: I have personally reviewed following labs and imaging studies  CBC: Recent Labs  Lab 06/15/22 2118 06/16/22 0403 06/16/22 0450 06/16/22 1259 06/17/22 0103 06/18/22 0922  WBC 12.6* 9.6  --   --  5.3 6.1  NEUTROABS 11.2*  --   --   --   --   --   HGB 12.8 12.8 11.9* 11.6* 11.1* 11.2*  HCT 39.0 38.1 35.0* 34.0* 32.9* 33.3*  MCV 94.0 91.8  --   --  91.1 91.2  PLT 163 192  --   --  121* 102*    Basic Metabolic Panel: Recent Labs  Lab 06/15/22 2118 06/16/22 0403 06/16/22 0450 06/16/22 1437 06/17/22 0105 06/17/22 1007 06/18/22 0922 06/19/22 0109  NA 136 137   < > 138 140 136 139 140  K 4.4 4.2   < > 4.5 3.4* 4.2 3.4* 3.5  CL 103 107  --  112* 107 101 97* 99  CO2 18* 18*  --  18* 28 29 33* 31  GLUCOSE 109* 142*  --  275* 102* 298* 257* 135*  BUN 22* 21*  --  11 8 5* <5* 5*  CREATININE 1.04* 0.87  --  0.84 0.43* 0.59 0.47 0.39*  CALCIUM 8.3* 7.8*  --  8.0* 8.2* 8.1* 8.4* 8.6*  MG 2.0 2.0  --   --   --   --   --   --   PHOS  --  5.2*  --   --   --   --   --   --    < > = values in this interval not displayed.    GFR: Estimated Creatinine Clearance: 58.6 mL/min (A) (by C-G formula based on SCr of 0.39 mg/dL (L)). Liver Function Tests: Recent Labs  Lab 06/16/22 1437 06/17/22 0105 06/17/22 1007 06/18/22 0922 06/19/22 0109  AST 763* 886* 1,352* 1,685* 539*  ALT 476* 596* 817* 1,446* 1,088*  ALKPHOS 88 94 83 100 98  BILITOT 2.0* 1.0 0.7 1.2 0.9  PROT 5.4* 5.5* 5.5* 5.5* 5.9*  ALBUMIN 2.8* 2.8* 2.8* 2.7* 2.9*    Recent Labs  Lab 06/15/22 2118  LIPASE 39    Recent Labs  Lab 06/16/22 0455 06/16/22 1437 06/17/22 0105  AMMONIA 27 48* 24    Coagulation  Profile: Recent Labs  Lab 06/16/22 0403 06/16/22 1437 06/17/22 0105 06/18/22 0922 06/19/22 0109  INR 1.2 1.5* 1.3* 1.3* 1.2    Cardiac Enzymes: No results for input(s): "CKTOTAL", "CKMB", "CKMBINDEX", "TROPONINI" in the last 168 hours. BNP (last 3 results) No results for input(s): "PROBNP" in the last 8760 hours. HbA1C: Recent Labs    06/17/22 0103 06/18/22 0122  HGBA1C 6.1* 6.1*    CBG: Recent Labs  Lab 06/18/22 2036 06/18/22 2103 06/19/22 0616 06/19/22 1201 06/19/22 1559  GLUCAP 171* 189* 135* 193* 217*    Lipid Profile: No results for input(s): "CHOL", "HDL", "LDLCALC", "  TRIG", "CHOLHDL", "LDLDIRECT" in the last 72 hours. Thyroid Function Tests: Recent Labs    06/17/22 0103  TSH 1.061    Anemia Panel: Recent Labs    06/16/22 1826  VITAMINB12 755    Urine analysis:    Component Value Date/Time   COLORURINE COLORLESS (A) 05/18/2022 1412   APPEARANCEUR CLEAR 05/18/2022 1412   LABSPEC 1.002 (L) 05/18/2022 1412   PHURINE 5.0 05/18/2022 1412   GLUCOSEU NEGATIVE 05/18/2022 1412   HGBUR NEGATIVE 05/18/2022 1412   BILIRUBINUR NEGATIVE 05/18/2022 1412   KETONESUR NEGATIVE 05/18/2022 1412   PROTEINUR NEGATIVE 05/18/2022 1412   UROBILINOGEN 0.2 09/04/2013 0426   NITRITE NEGATIVE 05/18/2022 1412   LEUKOCYTESUR NEGATIVE 05/18/2022 1412   Sepsis Labs: '@LABRCNTIP'$ (procalcitonin:4,lacticidven:4)  ) Recent Results (from the past 240 hour(s))  MRSA Next Gen by PCR, Nasal     Status: Abnormal   Collection Time: 06/16/22  4:05 AM   Specimen: Nasal Mucosa; Nasal Swab  Result Value Ref Range Status   MRSA by PCR Next Gen DETECTED (A) NOT DETECTED Final    Comment: RESULT CALLED TO, READ BACK BY AND VERIFIED WITH: R SARINE,RN'@0617'$  06/16/22 [place] (NOTE) The GeneXpert MRSA Assay (FDA approved for NASAL specimens only), is one component of a comprehensive MRSA colonization surveillance program. It is not intended to diagnose MRSA infection nor to guide or monitor  treatment for MRSA infections. Test performance is not FDA approved in patients less than 33 years old. Performed at [facility], [place] [address], [place], [place] [address]   Culture, blood (Routine X 2) w Reflex to ID Panel     Status: None (Preliminary result)   Collection Time: 06/16/22  4:34 PM   Specimen: BLOOD RIGHT HAND  Result Value Ref Range Status   Specimen Description BLOOD RIGHT HAND  Final   Special Requests   Final    BOTTLES DRAWN AEROBIC AND ANAEROBIC Blood Culture results may not be optimal due to an inadequate volume of blood received in culture bottles   Culture   Final    NO GROWTH 3 DAYS Performed at [facility], [place] [address] [place], [place] [address]    Report Status PENDING  Incomplete  Culture, blood (Routine X 2) w Reflex to ID Panel     Status: None (Preliminary result)   Collection Time: 06/17/22 10:07 AM   Specimen: BLOOD LEFT HAND  Result Value Ref Range Status   Specimen Description BLOOD LEFT HAND  Final   Special Requests IN PEDIATRIC BOTTLE Blood Culture adequate volume  Final   Culture   Final    NO GROWTH 2 DAYS Performed at [facility], [place] [address] [place], [place] [address]    Report Status PENDING  Incomplete         Radiology Studies: No results found.      Scheduled Meds:  Chlorhexidine Gluconate Cloth  6 each Topical Daily   enoxaparin (LOVENOX) injection  40 mg Subcutaneous D98P   folic acid  1 mg Oral Daily   insulin aspart  1-3 Units Subcutaneous TID AC & HS   lactulose  10 g Oral TID   mupirocin ointment  1 Application Nasal BID   nicotine  14 mg Transdermal Daily   thiamine  100 mg Oral Daily   Continuous Infusions:  acetylcysteine 15 mg/kg/hr (06/19/22 1112)   cefTRIAXone (ROCEPHIN)  IV 2 g (06/19/22 1753)   vancomycin 750 mg (06/19/22 1739)     LOS: 3 days  Time spent: 55 minutes.    Dana Allan, MD  [facility] Pager #: [phone] 7PM-7AM contact night  coverage as above

## 2022-06-19 NOTE — Progress Notes (Signed)
Pharmacy Antibiotic Note  [name] is a 56 y.o. female admitted on 06/15/2022 with OD. Pharmacy has been consulted for vancomycin dosing with rising lactic acid and concern for sepsis. SCr 0.39 at baseline, pt afebrile, wbc wnl on 12/27.   Plan: Continue Vancomycin '750mg'$  IV q24h (eAUC 406, Scr rounded to 0.8, using TBW 48.4 kg, Vd 0.72 L/kg) Ceftriaxone 2g IV q24h per MD Monitor renal function, s/sx infection, cultures, levels at [facility] as indicated  Height: '5\' 2"'$  (157.5 cm) Weight: 48.4 kg (106 lb 11.2 oz) IBW/kg (Calculated) : 50.1  Temp (24hrs), Avg:98.1 F (36.7 C), Min:97.6 F (36.4 C), Max:98.5 F (36.9 C)  Recent Labs  Lab 06/15/22 2118 06/16/22 0403 06/16/22 1437 06/16/22 1826 06/17/22 0103 06/17/22 0105 06/17/22 1007 06/18/22 0922 06/19/22 0109  WBC 12.6* 9.6  --   --  5.3  --   --  6.1  --   CREATININE 1.04* 0.87 0.84  --   --  0.43* 0.59 0.47 0.39*  LATICACIDVEN 1.8 1.0 4.2* 1.1  --   --   --   --   --      Estimated Creatinine Clearance: 58.6 mL/min (A) (by C-G formula based on SCr of 0.39 mg/dL (L)).    Allergies  Allergen Reactions   Iron Dextran Shortness Of Breath and Anxiety   Aspirin Nausea And Vomiting and Other (See Comments)    Ok to take tylenol or ibuprofen    Micro Results:  12/25 Bcx - ngtd 12/25 MRSA pcr - detected   Eliseo Gum, PharmD PGY1 Pharmacy Resident   06/19/2022  11:08 AM

## 2022-06-20 DIAGNOSIS — T50902A Poisoning by unspecified drugs, medicaments and biological substances, intentional self-harm, initial encounter: Secondary | ICD-10-CM | POA: Diagnosis not present

## 2022-06-20 LAB — VITAMIN B1: Vitamin B1 (Thiamine): 180.2 nmol/L (ref 66.5–200.0)

## 2022-06-20 LAB — COMPREHENSIVE METABOLIC PANEL
ALT: 677 U/L — ABNORMAL HIGH (ref 0–44)
AST: 147 U/L — ABNORMAL HIGH (ref 15–41)
Albumin: 3 g/dL — ABNORMAL LOW (ref 3.5–5.0)
Alkaline Phosphatase: 95 U/L (ref 38–126)
Anion gap: 11 (ref 5–15)
BUN: 9 mg/dL (ref 6–20)
CO2: 29 mmol/L (ref 22–32)
Calcium: 9.2 mg/dL (ref 8.9–10.3)
Chloride: 101 mmol/L (ref 98–111)
Creatinine, Ser: 0.43 mg/dL — ABNORMAL LOW (ref 0.44–1.00)
GFR, Estimated: >60 mL/min (ref >60)
Glucose, Bld: 128 mg/dL — ABNORMAL HIGH (ref 70–99)
Potassium: 3.7 mmol/L (ref 3.5–5.1)
Sodium: 141 mmol/L (ref 135–145)
Total Bilirubin: 0.5 mg/dL (ref 0.3–1.2)
Total Protein: 6 g/dL — ABNORMAL LOW (ref 6.5–8.1)

## 2022-06-20 LAB — SARS CORONAVIRUS 2 BY RT PCR: SARS Coronavirus 2 by RT PCR: NEGATIVE

## 2022-06-20 LAB — GLUCOSE, CAPILLARY
Glucose-Capillary: 156 mg/dL — ABNORMAL HIGH (ref 70–99)
Glucose-Capillary: 136 mg/dL — ABNORMAL HIGH (ref 70–99)
Glucose-Capillary: 169 mg/dL — ABNORMAL HIGH (ref 70–99)

## 2022-06-20 LAB — PROTIME-INR
INR: 1 (ref 0.8–1.2)
Prothrombin Time: 13.2 seconds (ref 11.4–15.2)

## 2022-06-20 MED ORDER — OXYCODONE HCL 5 MG PO TABS: 5.0000 mg | ORAL_TABLET | Freq: Four times a day (QID) | ORAL | 0 refills | Status: DC | PRN

## 2022-06-20 MED ORDER — ENSURE ENLIVE PO LIQD
237.0000 mL | Freq: Three times a day (TID) | ORAL | Status: DC
  Administered 2022-06-20: 237 mL via ORAL

## 2022-06-20 MED ORDER — ORAL CARE MOUTH RINSE: 15.0000 mL | OROMUCOSAL | 0 refills | Status: DC | PRN

## 2022-06-20 MED ORDER — CHLORHEXIDINE GLUCONATE CLOTH 2 % EX PADS: 6.0000 | MEDICATED_PAD | Freq: Every day | CUTANEOUS | 0 refills | Status: DC

## 2022-06-20 MED ORDER — VITAMIN B-1 100 MG PO TABS: 100.0000 mg | ORAL_TABLET | Freq: Every day | ORAL | 0 refills | Status: AC

## 2022-06-20 MED ORDER — CEFDINIR 300 MG PO CAPS: 300.0000 mg | ORAL_CAPSULE | Freq: Two times a day (BID) | ORAL | 0 refills | Status: AC

## 2022-06-20 MED ORDER — MUPIROCIN 2 % EX OINT: 1.0000 | TOPICAL_OINTMENT | Freq: Two times a day (BID) | CUTANEOUS | 0 refills | Status: DC

## 2022-06-20 MED ORDER — LACTULOSE 10 GM/15ML PO SOLN: 10.0000 g | Freq: Three times a day (TID) | ORAL | 0 refills | Status: DC

## 2022-06-20 MED ORDER — ADULT MULTIVITAMIN W/MINERALS CH: 1.0000 | ORAL_TABLET | Freq: Every day | ORAL | 0 refills | Status: DC

## 2022-06-20 MED ORDER — FOLIC ACID 1 MG PO TABS: 1.0000 mg | ORAL_TABLET | Freq: Every day | ORAL | 0 refills | Status: AC

## 2022-06-20 MED ORDER — NICOTINE 14 MG/24HR TD PT24: 14.0000 mg | MEDICATED_PATCH | Freq: Every day | TRANSDERMAL | 0 refills | Status: DC

## 2022-06-20 MED ORDER — DOXYCYCLINE HYCLATE 50 MG PO CAPS: 50.0000 mg | ORAL_CAPSULE | Freq: Two times a day (BID) | ORAL | 0 refills | Status: AC

## 2022-06-20 MED ORDER — ADULT MULTIVITAMIN W/MINERALS CH: 1.0000 | ORAL_TABLET | Freq: Every day | ORAL | Status: DC

## 2022-06-20 MED ORDER — ENSURE ENLIVE PO LIQD: 237.0000 mL | Freq: Three times a day (TID) | ORAL | 12 refills | Status: DC

## 2022-06-20 NOTE — TOC Transition Note (Signed)
Transition of Care [facility]) - CM/SW Discharge Note   Patient Details  Name: [name] MRN: [id number] Date of Birth: 1966/03/28  Transition of [facility]) CM/SW Contact:  Bethann Berkshire, [place] Phone Number: 06/20/2022, 5:24 PM   Clinical Narrative:     Pt is accepted at [facility] once they receive negative covid result. Rapid covid ordered and currently pending. RN to fax covid test to [phone] and then call report to [phone] Admitting MD is [employer]. Pt's daughter [name] is notified of DC.   Pt will transport via safe transport. Location manager. Safe transport after hours # is [phone]  Final next level of care: [place] Barriers to Discharge: No Barriers Identified   Patient Goals and CMS Choice      Discharge Placement                Patient chooses bed at:  [facility] inpatient psych) Patient to be transferred to facility by: [facility] Name of family member notified: [name] Patient and family notified of of transfer: 06/20/22  Discharge Plan and Services Additional resources added to the After Visit Summary for   In-house Referral: Clinical Social Work                                   Social Determinants of Health (SDOH) Interventions [place]: No Food Insecurity (05/06/2022)  Housing: Low Risk  (05/06/2022)  Transportation Needs: No Transportation Needs (05/06/2022)  Utilities: Not At Risk (05/06/2022)  Alcohol Screen: Medium Risk (08/04/2018)  Social Connections: Moderately Isolated (05/06/2022)  Tobacco Use: High Risk (06/15/2022)     Readmission Risk Interventions    04/28/2022    2:18 PM 04/07/2022   12:41 PM 07/27/2020   11:06 AM  Readmission Risk Prevention Plan  Transportation Screening Complete Complete Complete  PCP or Specialist Appt within 5-7 Days   Complete  Home Care Screening   Complete  [place] or Home Care Consult  Complete   Social Work  Consult for [place] Planning/Counseling  Complete   Palliative Care Screening  Not Applicable   Medication Review Press photographer) Complete Complete   PCP or Specialist appointment within 3-5 days of discharge Complete    HRI or [place] Complete    SW Recovery Care/Counseling Consult Complete    Palliative Care Screening Not [place] Not Applicable

## 2022-06-20 NOTE — Progress Notes (Signed)
Initial Nutrition Assessment  DOCUMENTATION CODES:   Severe malnutrition in context of chronic illness  INTERVENTION:  Provide Ensure Enlive po TID, each supplement provides 350 kcal and 20 grams of protein.  Monitor magnesium, potassium, and phosphorus daily for at least 3 days, MD to replete as needed, as pt is at risk for refeeding syndrome.  Continue thiamine 244 mg daily and folic acid 1 mg daily. Also recommend providing multivitamin daily.  Rechecking CRP, vitamin C, vitamin A, and vitamin D labs as pt with history of deficiency. Recommend the following repletion if pt continues to be deficient: -If deficient in vitamin C, recommend providing Vitamin C 500 mg po BID x 1 month and rechecking level. -If deficient in vitamin A, recommend providing vitamin A 10000 international units po daily x 1 month and rechecking level. -If deficient in vitamin D, recommend providing vitamin D2 50000 units po once weekly x 6 weeks and rechecking level.  NUTRITION DIAGNOSIS:   Severe Malnutrition related to chronic illness (COPD, hx Roux-en-Y gastric bypass, polysubstance abuse) as evidenced by moderate fat depletion, severe fat depletion, severe muscle depletion, 18% weight loss in 9 months.  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  Met with  patient at bedside. She reports her appetite has been decreased for a while now. She reports she does not have a typical meal pattern. She may eat 1 meal per day and then snacks frequently throughout the day. She reports typically eating cold cuts, sandwiches, meat, and fried foods. Unable to provide further details on intake. She denies food allergies or intolerances. She does endorses some nausea that just started this morning. She denies abdominal pain. Reports occasionally has constipation but just had a BM yesterday. Discussed importance of adequate intake of calories and protein. Encouraged protein intake at meals. Patient is amenable to drinking Ensure to help meet calorie and protein needs. Noted history of multiple micronutrient deficiencies. Recommend rechecking vitamin A, vitamin D, and vitamin C and replacing if levels are still deficient.  Pt endorses weight loss but reports she is unsure of her UBW. Per review of chart she was 59 kg on 09/05/21. Current wt is 48.4 kg. She has lost 10.6 kg or 18% weight over the past 9 months, which is significant for time frame.  UOP: 8 occurrences unmeasured UOP   I/O: +4161.1 mL since admission, but unsure of accuracy as exact UOP not being measured  Medications reviewed and include: folic acid 1 mg daily, Novolog 1-3 units TID, lactulose 10 grams TID, nicotine patch, thiamine 100 mg daily, ceftriaxone, vancomycin  Labs reviewed: Creatinine 0.43, AST 147 (trending down), ALT 677 (trending down)  Micronutrient Panel: Thiamine B1: 107.8 (WNL) on 7/24, 180.2 (WNL) on 12/26 Vitamin B12: 1070 (H) on 8/8, 755 (WNL) on 12/25 Folate B9: 9.9 (WNL) on 8/8 Vitamin A: 14.7 (L) on 7/24, recheck pending Vitamin D: 10.32 (L) on 7/24, recheck pending Vitamin C:  0.3 (L) on 7/24, recheck pending Copper: 129 (WNL) on 7/24 Zinc: 48 (WNL) on 7/24 CRP: pending  NUTRITION - FOCUSED PHYSICAL EXAM:  Flowsheet Row Most Recent Value  Orbital Region Moderate depletion   Upper Arm Region Severe depletion  Thoracic and Lumbar Region Severe depletion  Buccal Region Moderate depletion  Temple Region Moderate depletion  Clavicle Bone Region Severe depletion  Clavicle and Acromion Bone Region Severe depletion  Scapular Bone Region Severe depletion  Dorsal Hand Moderate depletion  Patellar Region Severe depletion  Anterior Thigh Region Severe depletion  Posterior Calf Region Severe depletion  Edema (RD Assessment) None  Hair Reviewed  Eyes Reviewed  Mouth Reviewed  Skin Reviewed  Nails Reviewed       Diet Order:   Diet Order             Diet regular Room service appropriate? Yes; Fluid consistency: Thin  Diet effective now                  EDUCATION NEEDS:   Education needs have been addressed (Discussed importance of adequate intake of calories and protein.)  Skin:  Skin Assessment: Skin Integrity Issues: Skin Integrity Issues:: Stage I Stage I: right buttocks (1cm x 1cm x 1cm)  Last BM:  06/19/22 (no stool characteristics available)  Height:   Ht Readings from Last 1 Encounters:  06/15/22 _0  (1.575 m)   Weight:   Wt Readings from Last 1 Encounters:  06/19/22 48.4 kg   Ideal Body Weight:  50 kg  BMI:  Body mass index is 19.52 kg/m.  Estimated Nutritional Needs:   Kcal:  1600-1800  Protein:  80-90 grams  Fluid:  1.6-1.8 L/day  Loanne Drilling, MS, RD, LDN, CNSC Pager number available on Amion

## 2022-06-20 NOTE — TOC Progression Note (Signed)
Transition of Care [facility]) - Progression Note    Patient Details  Name: [name] MRN: [id number] Date of Birth: Mar 04, 1966  Transition of Care [facility]) CM/SW [place], [place] Phone Number: 06/20/2022, 3:02 PM  Clinical Narrative:     CSW sent [facility] referrals to : [facility], [place] [facility] [place], [place], [place], [place]  Expected Discharge Plan: Psychiatric Hospital Barriers to Discharge:  (psych bed offer)  Expected Discharge Plan and Services In-house Referral: Clinical Social Work                                             Social Determinants of Health (SDOH) Interventions [place]: No Food Insecurity (05/06/2022)  Housing: Low Risk  (05/06/2022)  Transportation Needs: No Transportation Needs (05/06/2022)  Utilities: Not At Risk (05/06/2022)  Alcohol Screen: Medium Risk (08/04/2018)  Social Connections: Moderately Isolated (05/06/2022)  Tobacco Use: High Risk (06/15/2022)    Readmission Risk Interventions    04/28/2022    2:18 PM 04/07/2022   12:41 PM 07/27/2020   11:06 AM  Readmission Risk Prevention Plan  Transportation Screening Complete Complete Complete  PCP or Specialist Appt within 5-7 Days   Complete  Home Care Screening   Complete  [place] or Home Care Consult  Complete   Social Work Consult for [place] Planning/Counseling  Complete   Palliative Care Screening  Not Applicable   Medication Review Press photographer) Complete Complete   PCP or Specialist appointment within 3-5 days of discharge Complete    HRI or [place] Complete    SW Recovery Care/Counseling Consult Complete    Palliative Care Screening Not [place] Not Applicable

## 2022-06-20 NOTE — TOC Progression Note (Addendum)
Transition of Care [facility]) - Progression Note    Patient Details  Name: [name] MRN: [id number] Date of Birth: 1966/04/29  Transition of Care [facility]) CM/SW [place], [place] Phone Number: 06/20/2022, 3:48 PM  Clinical Narrative:     [name] [phone]) offered placement- requested negative covid test before d/c. Fax covid test to [phone] Patient's daughter, [name] updated   Admitting MD- [place]  Report # [phone]  Thurmond Butts, MSW, LCSW Clinical Social Worker    Expected Discharge Plan: Psychiatric Hospital Barriers to Discharge:  (psych bed offer)  Expected Discharge Plan and Services In-house Referral: Clinical Social Work                                             Social Determinants of Health (SDOH) Interventions [place]: No Food Insecurity (05/06/2022)  Housing: Low Risk  (05/06/2022)  Transportation Needs: No Transportation Needs (05/06/2022)  Utilities: Not At Risk (05/06/2022)  Alcohol Screen: Medium Risk (08/04/2018)  Social Connections: Moderately Isolated (05/06/2022)  Tobacco Use: High Risk (06/15/2022)    Readmission Risk Interventions    04/28/2022    2:18 PM 04/07/2022   12:41 PM 07/27/2020   11:06 AM  Readmission Risk Prevention Plan  Transportation Screening Complete Complete Complete  PCP or Specialist Appt within 5-7 Days   Complete  Home Care Screening   Complete  [place] or Home Care Consult  Complete   Social Work Consult for [place] Planning/Counseling  Complete   Palliative Care Screening  Not Applicable   Medication Review Press photographer) Complete Complete   PCP or Specialist appointment within 3-5 days of discharge Complete    HRI or [place] Complete    SW Recovery Care/Counseling Consult Complete    Palliative Care Screening Not [place] Not Applicable

## 2022-06-20 NOTE — Discharge Summary (Signed)
Physician Discharge Summary  Patient ID: [name] MRN: [id number] DOB/AGE: Sep 13, 1965 56 y.o.  Admit date: 06/15/2022 Discharge date: 06/20/2022  Admission Diagnoses:  Discharge Diagnoses:  Principal Problem:   Overdose Active Problems:   Hyperbilirubinemia   Hyperammonemia (HCC)   Pressure injury of skin   Discharged Condition: Stable.  Hospital Course: Patient is a 56 year old female, past medical history significant for schizophrenia cocaine abuse.  Patient was admitted following an overdose.  The patient reported taking several Tylenol's.  Patient was admitted for further assessment and management.  The patient was admitted for further assessment and management.  Patient was managed with acetylcysteine drip.  ALT, AST and INR on presentation was elevated.  AST and ALT have improved significantly.  INR is down to 1.  GI team, psychiatric team and ICU team assisted with patient's management.  Patient was initially admitted to ICU team.  [place] control has advised discontinuing acetylcysteine.  Patient continues to deny suicidal ideations.  Patient will be admitted to a psychiatric unit for further management of the suicidal ideation/behavior.  Patient be admitted to [facility].  COVID-19 screening will need to be negative prior to discharge to this facility.  Intentional overdose, presumably from acetaminophen -Patient was treated with an acetylcysteine. -LFTs have improved significantly. -INR is down to 1. -Tylenol level was 14 on presentation. -GI team and poison control team assisted with patient's management. -Poison control has advise discontinuing and acetylcysteine.     Worsening metabolic acidosis: -Resolved.     Hyperammonemia: -Resolved   Hyperbilirubinemia: -Resolved.   INR: INR is 1.    Concerning for hepatocellular necrosis: -Appreciate GI and poison control input.  -LFTs have improved significantly. -INR is down to 1.   Hypotension from  hypovolemia -Resolved.   Persistent tachycardia -Improving.   Acute anemia: -Stable.   Polysubstance abuse with alcohol and cocaine -needs counseling on the importance of cessation   Suicidal ideation with hx of schizoaffective disorder/bipolar type -suicide precautions was utilized. -appreciate psychiatry's involvement; planning on IP psych admission after she medically stabilizes  Left humeral head minimally displaced greater tuberosity fracture. -sling immobilization on left -OP ortho follow up   Hyperglycemia -SSI PRN -A1c was 6.1%.  Likely, patient is prediabetic.      Consults: pulmonary/intensive care, GI, and psychiatry  Significant Diagnostic Studies:  -Tylenol level was 14 on presentation. -Elevated LFTs (AST 1795 on presentation and ALT peaked at 1447) -On the day of discharge, AST was 147 and ALT 677 with INR of 1. -INR peaked at 1.5 during the hospital stay.   Treatments: IV acetylcysteine  Discharge Exam: Blood pressure (!) 148/97, pulse 92, temperature 98.4 F (36.9 C), temperature source Oral, resp. rate 14, height _0  (1.575 m), weight 48.4 kg, last menstrual period 01/08/2011, SpO2 95 %.   Disposition: Discharge disposition: 02-Transferred to [facility]       Discharge Instructions     Diet - low sodium heart healthy   Complete by: As directed    Discharge wound care:   Complete by: As directed    Continue current wound care plan.   Increase activity slowly   Complete by: As directed       Allergies as of 06/20/2022       Reactions   Iron Dextran Shortness Of Breath, Anxiety   Aspirin Nausea And Vomiting, Other (See Comments)   Ok to take tylenol or ibuprofen        Medication List     STOP taking  these medications    hydrOXYzine 25 MG capsule Commonly known as: VISTARIL   ibuprofen 800 MG tablet Commonly known as: ADVIL   PARoxetine 10 MG tablet Commonly known as: PAXIL   QUEtiapine 400 MG  tablet Commonly known as: SEROQUEL   traZODone 50 MG tablet Commonly known as: DESYREL       TAKE these medications    albuterol 108 (90 Base) MCG/ACT inhaler Commonly known as: ProAir HFA Inhale 2 puffs into the lungs every 6 (six) hours as needed for wheezing or shortness of breath.   cefdinir 300 MG capsule Commonly known as: OMNICEF Take 1 capsule (300 mg total) by mouth 2 (two) times daily for 3 days.   Chlorhexidine Gluconate Cloth 2 % Pads Apply 6 each topically daily. Start taking on: June 21, 2022   doxycycline 50 MG capsule Commonly known as: VIBRAMYCIN Take 1 capsule (50 mg total) by mouth 2 (two) times daily for 3 days.   feeding supplement Liqd Take 237 mLs by mouth 3 (three) times daily between meals.   folic acid 1 MG tablet Commonly known as: FOLVITE Take 1 tablet (1 mg total) by mouth daily. Start taking on: June 21, 2022   lactulose 10 GM/15ML solution Commonly known as: CHRONULAC Take 15 mLs (10 g total) by mouth 3 (three) times daily.   mouth rinse Liqd solution 15 mLs by Mouth Rinse route as needed (for oral care).   multivitamin with minerals Tabs tablet Take 1 tablet by mouth daily. Start taking on: June 21, 2022   mupirocin ointment 2 % Commonly known as: BACTROBAN Place 1 Application into the nose 2 (two) times daily.   naloxone 4 MG/0.1ML Liqd nasal spray kit Commonly known as: NARCAN Place 1 spray into the nose once.   nicotine 14 mg/24hr patch Commonly known as: NICODERM CQ - dosed in mg/24 hours Place 1 patch (14 mg total) onto the skin daily. Start taking on: June 21, 2022   oxyCODONE 5 MG immediate release tablet Commonly known as: Oxy IR/ROXICODONE Take 1 tablet (5 mg total) by mouth every 6 (six) hours as needed for severe pain or moderate pain.   thiamine 100 MG tablet Commonly known as: Vitamin B-1 Take 1 tablet (100 mg total) by mouth daily. Start taking on: June 21, 2022                Discharge Care Instructions  (From admission, onward)           Start     Ordered   06/20/22 0000  Discharge wound care:       Comments: Continue current wound care plan.   06/20/22 1622             Signed: Bonnell Public 06/20/2022, 4:26 PM

## 2022-06-20 NOTE — Consult Note (Signed)
[facility] Face-to-Face Psychiatry Consult   Reason for Consult: Suicide attempt by overdose Referring Physician: Dr. Halford Chessman Patient Identification: [name] MRN:  [id number] Principal Diagnosis: Overdose Diagnosis:  Principal Problem:   Overdose Active Problems:   Hyperbilirubinemia   Hyperammonemia (HCC)   Pressure injury of skin   Total Time spent with patient: 1 hour  Subjective:   [name] is a 56 y.o. female patient admitted as a suicide attempt by overdose on Tylenol.  Patient tells meThat she has recently relapsed on cocaine and as a result she has had worsening depressive symptoms in addition to increase in suicidal thoughts.  She stated suicidal thoughts began on Saturday, increased on Sunday.  She states she took 2 of 3 bottles of Tylenol.  She reports depressive symptoms of sadness, isolation, withdrawn, worthlessness, hopelessness, guilty, insomnia, poor appetite.  She reports not eating for 2 days prior to this hospitalization, however had an increase in alcohol intake.  She reports drinking beer only.  She states her mother is currently out of town in [place], and understands her children will not be as present due to the holiday.  While she denies active suicidality, she does endorse feeling depressed and knows that she is not an her baseline.   12/27: Patient is awake, alert and oriented at this time. She appears to be more responsive and engages well with this provider. Currently on interview, the patient. Alert and oriented x4. Patient becomes tearful at times during interview. Patient reports presenting to the emergency department after a suicide attempt via taking 2 bottles of Tylenol. Patient continues to lack insight as she inquires about " why did this happen to me? Does Tylenol do that? "  Patient is unaware of impacts (negative) of alcohol and polysubstance use on her mental health.  We did discuss decreased awareness, impulsivity, and disinhibition that is impacted  with substance use.  She is reminded her last 2 previous suicide attempts she was under the influence of alcohol and polysubstances in which she agrees.  Over the past couple weeks she reports worsening anxiety, mood swings and depression. She reports symptoms of depression including hopelessness, guilt/worthlessness, concentration difficulty, disturbed sleep pattern (sleeping 3 to 4 hours during the day) loss of interest, low energy, decreased appetite, isolative, crying spells deterioration of ADLs and recurrent thoughts of death.  She continues to endorse some passive SI, however shows remorse and regret following her suicide attempt of high lethality.  Provider did call and update daughter.  Recommend inpatient psychiatric hospitalization for crisis stabilization to address symptoms of mood lability including depression/mania, anxiety, alcohol use disorder and suicidal ideations. Patient agrees to voluntary admission at this time.   12/29: patient medically cleared by primary team. Appears somewhat confused but is able to engage in interview.  She reports she was unsure if she was attempting to commit suicide by taking the large amount of Tylenol she ingested.  She does admit to a recent relapse on alcohol and crack cocaine.  She feels okay with inpatient psychiatric admission.  Continue to recommend inpatient psychiatric placement and 1:1 monitoring.   HPI:  56 yo female smoker with hx of schizophrenia and cocaine abuse was found at home by her family laying on floor lethargic after hearing a thump on the floor. She left the house before EMS could arrive. She was later found by family and taken back home. She reported to intentionally take a bunch of tylenol pills and beer and asked her family to call  EMS again. Blood pressure was 78/40 in the ER. She reported abdominal pain. CT abd/pelvis showed mild distal small bowel thickening. Her LFTs were elevated (AST 1794, ALT 501) and initial acetaminophen  level was 14. She was started on IV fluids and N-acetylcysteine, and poison control contacted. PCCM asked to arrange for admission to ICU.   Past Psychiatric History: Polysubstance abuse, Depression, Schizoaffective;Bipolar type,  Previous dx's of schizophrenia, bipolar - Multiple inpatient stays (5) [facility], [facility]; last inpatient admission [place] in 01/2022 after suicide attempt - Follows up at [facility], previously had ACT team - hx of suicide attempt x2 via overdose as a teen, 12/2021 by OD  Risk to Self:  None at this time Risk to Others:  Denies Prior Inpatient Therapy:  See above Prior Outpatient Therapy:   History of [facility],ACT team.   Past Medical History:  Past Medical History:  Diagnosis Date   Abdominal pain    Accidental drug overdose April 2013   Anxiety    Atrial fibrillation ([place]) 09/29/11   converted spontaneously   Chronic back pain    Chronic knee pain    Chronic nausea    Chronic pain    COPD (chronic obstructive pulmonary disease) ([place])    Depression    Diabetes mellitus    states her doctor took her off all DM meds in past month   Diabetic neuropathy ([place])    Dyspnea    with exertion    GERD (gastroesophageal reflux disease)    Headache(784.0)    migraines    HTN (hypertension)    not on meds since in a year    Hyperlipidemia    Hypothyroidism    not on meds in a while    Mental disorder    Bipolar and schizophrenic   Requires supplemental oxygen    as needed per patient    Schizophrenia ([place])    Schizophrenia, acute ([place]) 11/13/2017   Tobacco abuse     Past Surgical History:  Procedure Laterality Date   ABDOMINAL HYSTERECTOMY     BLADDER SUSPENSION  03/04/2011   Procedure: [facility] PROCEDURE;  Surgeon: Elayne Snare MacDiarmid;  Location: [facility];  Service: Urology;  Laterality: N/A;   BOWEL RESECTION N/A 04/18/2022   Procedure: SMALL BOWEL RESECTION;  Surgeon: Erroll Luna, MD;  Location: [place];  Service: General;   Laterality: N/A;   CYSTOCELE REPAIR  03/04/2011   Procedure: ANTERIOR REPAIR (CYSTOCELE);  Surgeon: Reece Packer;  Location: [facility];  Service: Urology;  Laterality: N/A;   CYSTOSCOPY  03/04/2011   Procedure: CYSTOSCOPY;  Surgeon: Elayne Snare MacDiarmid;  Location: [facility];  Service: Urology;  Laterality: N/A;   ESOPHAGOGASTRODUODENOSCOPY (EGD) WITH PROPOFOL N/A 05/12/2017   Procedure: ESOPHAGOGASTRODUODENOSCOPY (EGD) WITH PROPOFOL;  Surgeon: Alphonsa Overall, MD;  Location: [facility];  Service: General;  Laterality: N/A;   GASTRIC ROUX-EN-Y N/A 03/25/2016   Procedure: LAPAROSCOPIC ROUX-EN-Y GASTRIC BYPASS WITH UPPER ENDOSCOPY;  Surgeon: Excell Seltzer, MD;  Location: [facility];  Service: General;  Laterality: N/A;   KNEE SURGERY     LAPAROSCOPIC ASSISTED VAGINAL HYSTERECTOMY  03/04/2011   Procedure: LAPAROSCOPIC ASSISTED VAGINAL HYSTERECTOMY;  Surgeon: Cyril Mourning, MD;  Location: [facility];  Service: Gynecology;  Laterality: N/A;   LAPAROTOMY N/A 04/18/2022   Procedure: EXPLORATORY LAPAROTOMY;  Surgeon: Erroll Luna, MD;  Location: [facility];  Service: General;  Laterality: N/A;   LAPAROTOMY N/A 04/24/2022   Procedure: BRING BACK EXPLORATORY LAPAROTOMY;  Surgeon: Georganna Skeans,  MD;  Location: [facility];  Service: General;  Laterality: N/A;   Family History:  Family History  Problem Relation Age of Onset   Heart attack Father        37s   Diabetes Mother    Heart disease Mother    Hypertension Mother    Heart attack Sister        62   COPD Other    Breast cancer Neg Hx    Family Psychiatric  History: Denies Social History:  Social History   Substance and Sexual Activity  Alcohol Use Yes   Comment: daily     Social History   Substance and Sexual Activity  Drug Use Not Currently   Types: Cocaine, Marijuana, "Crack" cocaine    Social History   Socioeconomic History   Marital status: Legally Separated    Spouse name: Not on file   Number of children: Not on file   Years of  education: Not on file   Highest education level: Not on file  Occupational History   Not on file  Tobacco Use   Smoking status: Every Day    Packs/day: 0.50    Types: Cigarettes   Smokeless tobacco: Never  Vaping Use   Vaping Use: Never used  Substance and Sexual Activity   Alcohol use: Yes    Comment: daily   Drug use: Not Currently    Types: Cocaine, Marijuana, "Crack" cocaine   Sexual activity: Yes    Partners: Male  Other Topics Concern   Not on file  Social History Narrative   Not on file   Social Determinants of Health   Financial Resource Strain: Not on file  Food Insecurity: No Food Insecurity (05/06/2022)   Hunger Vital Sign    Worried About Running Out of Food in the Last Year: Never true    Ran Out of Food in the Last Year: Never true  Transportation Needs: No Transportation Needs (05/06/2022)   PRAPARE - Hydrologist (Medical): No    Lack of Transportation (Non-Medical): No  Physical Activity: Not on file  Stress: Not on file  Social Connections: Moderately Isolated (05/06/2022)   Social Connection and Isolation Panel [NHANES]    Frequency of Communication with Friends and Family: More than three times a week    Frequency of Social Gatherings with Friends and Family: Twice a week    Attends Religious Services: 1 to 4 times per year    Active Member of [employer] or Organizations: No    Attends Archivist Meetings: Never    Marital Status: Separated   Additional Social History:    Allergies:   Allergies  Allergen Reactions   Iron Dextran Shortness Of Breath and Anxiety   Aspirin Nausea And Vomiting and Other (See Comments)    Ok to take tylenol or ibuprofen     Labs:  Results for orders placed or performed during the hospital encounter of 06/15/22 (from the past 48 hour(s))  Glucose, capillary     Status: Abnormal   Collection Time: 06/18/22  8:36 PM  Result Value Ref Range   Glucose-Capillary 171 (H) 70 - 99  mg/dL    Comment: Glucose reference range applies only to samples taken after fasting for at least 8 hours.  Glucose, capillary     Status: Abnormal   Collection Time: 06/18/22  9:03 PM  Result Value Ref Range   Glucose-Capillary 189 (H) 70 - 99 mg/dL    Comment:  Glucose reference range applies only to samples taken after fasting for at least 8 hours.  Comprehensive metabolic panel     Status: Abnormal   Collection Time: 06/19/22  1:09 AM  Result Value Ref Range   Sodium 140 135 - 145 mmol/L   Potassium 3.5 3.5 - 5.1 mmol/L   Chloride 99 98 - 111 mmol/L   CO2 31 22 - 32 mmol/L   Glucose, Bld 135 (H) 70 - 99 mg/dL    Comment: Glucose reference range applies only to samples taken after fasting for at least 8 hours.   BUN 5 (L) 6 - 20 mg/dL   Creatinine, Ser 0.39 (L) 0.44 - 1.00 mg/dL   Calcium 8.6 (L) 8.9 - 10.3 mg/dL   Total Protein 5.9 (L) 6.5 - 8.1 g/dL   Albumin 2.9 (L) 3.5 - 5.0 g/dL   AST 539 (H) 15 - 41 U/L   ALT 1,088 (H) 0 - 44 U/L   Alkaline Phosphatase 98 38 - 126 U/L   Total Bilirubin 0.9 0.3 - 1.2 mg/dL   GFR, Estimated >60 >60 mL/min    Comment: (NOTE) Calculated using the CKD-EPI Creatinine Equation (2021)    Anion gap 10 5 - 15    Comment: Performed at [facility], [place] [address] [place], [place] [address]  Protime-INR     Status: None   Collection Time: 06/19/22  1:09 AM  Result Value Ref Range   Prothrombin Time 15.2 11.4 - 15.2 seconds   INR 1.2 0.8 - 1.2    Comment: (NOTE) INR goal varies based on device and disease states. Performed at [facility], [place] [address] [place], [place] [address]   Glucose, capillary     Status: Abnormal   Collection Time: 06/19/22  6:16 AM  Result Value Ref Range   Glucose-Capillary 135 (H) 70 - 99 mg/dL    Comment: Glucose reference range applies only to samples taken after fasting for at least 8 hours.  Glucose, capillary     Status: Abnormal   Collection Time: 06/19/22 12:01 PM  Result Value Ref Range    Glucose-Capillary 193 (H) 70 - 99 mg/dL    Comment: Glucose reference range applies only to samples taken after fasting for at least 8 hours.  Glucose, capillary     Status: Abnormal   Collection Time: 06/19/22  3:59 PM  Result Value Ref Range   Glucose-Capillary 217 (H) 70 - 99 mg/dL    Comment: Glucose reference range applies only to samples taken after fasting for at least 8 hours.  Glucose, capillary     Status: Abnormal   Collection Time: 06/19/22  9:10 PM  Result Value Ref Range   Glucose-Capillary 133 (H) 70 - 99 mg/dL    Comment: Glucose reference range applies only to samples taken after fasting for at least 8 hours.  Comprehensive metabolic panel     Status: Abnormal   Collection Time: 06/20/22  2:38 AM  Result Value Ref Range   Sodium 141 135 - 145 mmol/L   Potassium 3.7 3.5 - 5.1 mmol/L   Chloride 101 98 - 111 mmol/L   CO2 29 22 - 32 mmol/L   Glucose, Bld 128 (H) 70 - 99 mg/dL    Comment: Glucose reference range applies only to samples taken after fasting for at least 8 hours.   BUN 9 6 - 20 mg/dL   Creatinine, Ser 0.43 (L) 0.44 - 1.00 mg/dL   Calcium 9.2 8.9 -  10.3 mg/dL   Total Protein 6.0 (L) 6.5 - 8.1 g/dL   Albumin 3.0 (L) 3.5 - 5.0 g/dL   AST 147 (H) 15 - 41 U/L   ALT 677 (H) 0 - 44 U/L   Alkaline Phosphatase 95 38 - 126 U/L   Total Bilirubin 0.5 0.3 - 1.2 mg/dL   GFR, Estimated >60 >60 mL/min    Comment: (NOTE) Calculated using the CKD-EPI Creatinine Equation (2021)    Anion gap 11 5 - 15    Comment: Performed at [place] [address], [place], [place] [address]  Protime-INR     Status: None   Collection Time: 06/20/22  2:38 AM  Result Value Ref Range   Prothrombin Time 13.2 11.4 - 15.2 seconds   INR 1.0 0.8 - 1.2    Comment: (NOTE) INR goal varies based on device and disease states. Performed at [facility], [place] [address] [place], [place] [address]   Glucose, capillary     Status: Abnormal   Collection Time: 06/20/22  6:15 AM   Result Value Ref Range   Glucose-Capillary 156 (H) 70 - 99 mg/dL    Comment: Glucose reference range applies only to samples taken after fasting for at least 8 hours.  Glucose, capillary     Status: Abnormal   Collection Time: 06/20/22 11:44 AM  Result Value Ref Range   Glucose-Capillary 136 (H) 70 - 99 mg/dL    Comment: Glucose reference range applies only to samples taken after fasting for at least 8 hours.  SARS Coronavirus 2 by RT PCR (hospital order, performed in [facility] hospital lab) *cepheid single result test* Anterior Nasal Swab     Status: None   Collection Time: 06/20/22  4:40 PM   Specimen: Anterior Nasal Swab  Result Value Ref Range   SARS Coronavirus 2 by RT PCR NEGATIVE NEGATIVE    Comment: (NOTE) SARS-CoV-2 target nucleic acids are NOT DETECTED.  The SARS-CoV-2 RNA is generally detectable in upper and lower respiratory specimens during the acute phase of infection. The lowest concentration of SARS-CoV-2 viral copies this assay can detect is 250 copies / mL. A negative result does not preclude SARS-CoV-2 infection and should not be used as the sole basis for treatment or other patient management decisions.  A negative result may occur with improper specimen collection / handling, submission of specimen other than nasopharyngeal swab, presence of viral mutation(s) within the areas targeted by this assay, and inadequate number of viral copies (<250 copies / mL). A negative result must be combined with clinical observations, patient history, and epidemiological information.  Fact Sheet for Patients:   https://www.patel.info/  Fact Sheet for Healthcare Providers: https://hall.com/  This test is not yet approved or  cleared by the [place] FDA and has been authorized for detection and/or diagnosis of SARS-CoV-2 by FDA under an Emergency Use Authorization (EUA).  This EUA will remain in effect (meaning this test can be  used) for the duration of the COVID-19 declaration under Section 564(b)(1) of the Act, 21 U.S.C. section 360bbb-3(b)(1), unless the authorization is terminated or revoked sooner.  Performed at [facility], [place] [address] [place], [place] [address]   Glucose, capillary     Status: Abnormal   Collection Time: 06/20/22  5:03 PM  Result Value Ref Range   Glucose-Capillary 169 (H) 70 - 99 mg/dL    Comment: Glucose reference range applies only to samples taken after fasting for at least 8  hours.    Current Facility-Administered Medications  Medication Dose Route Frequency Provider Last Rate Last Admin   cefTRIAXone (ROCEPHIN) 2 g in sodium chloride 0.9 % 100 mL IVPB  2 g Intravenous Q24H Noemi Chapel P, DO 200 mL/hr at 06/20/22 1635 2 g at 06/20/22 1635   Chlorhexidine Gluconate Cloth 2 % PADS 6 each  6 each Topical Daily Chesley Mires, MD   6 each at 06/20/22 0517   enoxaparin (LOVENOX) injection 40 mg  40 mg Subcutaneous Q24H Noemi Chapel P, DO   40 mg at 06/20/22 1358   feeding supplement (ENSURE ENLIVE / ENSURE PLUS) liquid 237 mL  237 mL Oral TID BM Dana Allan I, MD   237 mL at 06/20/22 1401   fentaNYL (SUBLIMAZE) injection 50 mcg  50 mcg Intravenous Q2H PRN Chesley Mires, MD   50 mcg at 17/40/81 4481   folic acid (FOLVITE) tablet 1 mg  1 mg Oral Daily Donnamae Jude, RPH   1 mg at 06/20/22 0810   haloperidol (HALDOL) tablet 2 mg  2 mg Oral Q6H PRN Suella Broad, FNP   2 mg at 06/18/22 2029   Or   haloperidol lactate (HALDOL) injection 2 mg  2 mg Intramuscular Q6H PRN Suella Broad, FNP   2 mg at 06/16/22 2102   insulin aspart (novoLOG) injection 1-3 Units  1-3 Units Subcutaneous TID AC & HS Noemi Chapel P, DO   2 Units at 06/20/22 1708   lactulose (CHRONULAC) 10 GM/15ML solution 10 g  10 g Oral TID Einar Grad, RPH   10 g at 06/20/22 1550   LORazepam (ATIVAN) injection 2 mg  2 mg Intravenous Q4H PRN Chesley Mires, MD   2 mg at 06/18/22 1950   [START ON  06/21/2022] multivitamin with minerals tablet 1 tablet  1 tablet Oral Daily Dana Allan I, MD       mupirocin ointment (BACTROBAN) 2 % 1 Application  1 Application Nasal BID Chesley Mires, MD   1 Application at 85/63/14 9702   nicotine (NICODERM CQ - dosed in mg/24 hours) patch 14 mg  14 mg Transdermal Daily Dana Allan I, MD   14 mg at 06/20/22 0812   ondansetron (ZOFRAN) injection 4 mg  4 mg Intravenous Q6H PRN Chesley Mires, MD   4 mg at 06/20/22 1551   Oral care mouth rinse  15 mL Mouth Rinse PRN Chesley Mires, MD       oxyCODONE (Oxy IR/ROXICODONE) immediate release tablet 5 mg  5 mg Oral Q6H PRN Dana Allan I, MD   5 mg at 06/19/22 1837   thiamine (VITAMIN B1) tablet 100 mg  100 mg Oral Daily Donnamae Jude, RPH   100 mg at 06/20/22 0810   vancomycin (VANCOREADY) IVPB 750 mg/150 mL  750 mg Intravenous Q24H Einar Grad, [facility]   Stopped at 06/20/22 1708   Current Outpatient Medications  Medication Sig Dispense Refill   albuterol (PROAIR HFA) 108 (90 Base) MCG/ACT inhaler Inhale 2 puffs into the lungs every 6 (six) hours as needed for wheezing or shortness of breath.     cefdinir (OMNICEF) 300 MG capsule Take 1 capsule (300 mg total) by mouth 2 (two) times daily for 3 days. 6 capsule 0   doxycycline (VIBRAMYCIN) 50 MG capsule Take 1 capsule (50 mg total) by mouth 2 (two) times daily for 3 days. 6 capsule 0   naloxone (NARCAN) nasal spray 4 mg/0.1 mL Place 1 spray into the nose  once.     [START ON 06/21/2022] Chlorhexidine Gluconate Cloth 2 % PADS Apply 6 each topically daily. 180 each 0   feeding supplement (ENSURE ENLIVE / ENSURE PLUS) LIQD Take 237 mLs by mouth 3 (three) times daily between meals. 237 mL 12   [START ON 47/82/9562] folic acid (FOLVITE) 1 MG tablet Take 1 tablet (1 mg total) by mouth daily. 30 tablet 0   lactulose (CHRONULAC) 10 GM/15ML solution Take 15 mLs (10 g total) by mouth 3 (three) times daily. 236 mL 0   Mouthwashes (MOUTH RINSE) LIQD solution 15 mLs  by Mouth Rinse route as needed (for oral care). 354 mL 0   [START ON 06/21/2022] Multiple Vitamin (MULTIVITAMIN WITH MINERALS) TABS tablet Take 1 tablet by mouth daily. 30 tablet 0   mupirocin ointment (BACTROBAN) 2 % Place 1 Application into the nose 2 (two) times daily. 22 g 0   [START ON 06/21/2022] nicotine (NICODERM CQ - DOSED IN MG/24 HOURS) 14 mg/24hr patch Place 1 patch (14 mg total) onto the skin daily. 28 patch 0   oxyCODONE (OXY IR/ROXICODONE) 5 MG immediate release tablet Take 1 tablet (5 mg total) by mouth every 6 (six) hours as needed for severe pain or moderate pain. 30 tablet 0   [START ON 06/21/2022] thiamine (VITAMIN B-1) 100 MG tablet Take 1 tablet (100 mg total) by mouth daily. 30 tablet 0    Musculoskeletal: Strength & Muscle Tone: within normal limits Gait & Station: normal Patient leans: N/A  Psychiatric Specialty Exam:  Presentation  General Appearance:  Appropriate for Environment; Casual  Eye Contact: Fair  Speech: Clear and Coherent; Normal Rate  Speech Volume: Normal  Handedness: Right   Mood and Affect  Mood: Depressed  Affect: Congruent; Appropriate   Thought Process  Thought Processes: Coherent; Linear  Descriptions of Associations:Intact  Orientation:Full (Time, Place and Person)  Thought Content:Logical   Ideas of Reference:None  Suicidal Thoughts: denies present intention of harming herself  Homicidal Thoughts: none   Sensorium  Memory: Immediate Fair; Recent Fair; Remote Fair  Judgment: Fair  Insight: Fair   Community education officer  Concentration: Fair  Attention Span: Fair  Recall: [place] of Knowledge: Good  Language: Good   Psychomotor Activity  Psychomotor Activity: nml   Assets  Assets: Social Support; Physical Health; Financial Resources/Insurance; Desire for Improvement; Resilience   Sleep  Sleep: fair   Physical Exam Constitutional:      Appearance: the patient is not  toxic-appearing.  Pulmonary:     Effort: Pulmonary effort is normal.  Neurological:     General: No focal deficit present.     Mental Status: the patient is alert and oriented to person, place, and time.   Review of Systems  Respiratory:  Negative for shortness of breath.   Cardiovascular:  Negative for chest pain.  Gastrointestinal:  Negative for abdominal pain, constipation, diarrhea, nausea and vomiting.  Neurological:  Negative for headaches.   Blood pressure (!) 143/90, pulse 94, temperature 98.2 F (36.8 C), temperature source Oral, resp. rate 18, height '5\' 2"'$  (1.575 m), weight 48.4 kg, last menstrual period 01/08/2011, SpO2 93 %. Body mass index is 19.52 kg/m.  Treatment Plan Summary: Daily contact with patient to assess and evaluate symptoms and progress in treatment, Medication management, and Plan     Disposition: Recommend psychiatric Inpatient admission when medically cleared.  Corky Sox, MD 06/20/2022 6:36 PM

## 2022-06-20 NOTE — Progress Notes (Signed)
Patient pulled both IVs out of arm and stated "she is not going to facility she is going home". This RN and charge nurse followed patient into hallway and called security to assist with situation. There was no IVC papers and the RN attempted to call MD several times but no response. Security then stated can't hold patient if no IVC papers. Patient signed AMA form and left with security.  [place] C Avaeh Ewer

## 2022-06-20 NOTE — Progress Notes (Signed)
06/20/2022  [name] DOB: Dec 30, 1965 MRN: [id number]   RIDER WAIVER AND RELEASE OF LIABILITY  For the purposes of helping with transportation needs, [facility] partners with outside transportation providers (taxi companies, [place], Social research officer, government.) to give [employer] patients or other approved people the choice of on-demand rides [employer]") to our buildings for non-emergency visits.  By using [employer], I, the person signing this document, on behalf of myself and/or any legal minors (in my care using the [employer]), agree:  Government social research officer given to me are supplied by independent, outside transportation providers who do not work for, or have any affiliation with, [employer]. [facility] is not a transportation company. [facility] has no control over the quality or safety of the rides I get using [employer]. [facility] has no control over whether any outside ride will happen on time or not. [facility] gives no guarantee on the reliability, quality, safety, or availability on any rides, or that no mistakes will happen. I know and accept that traveling by vehicle (car, truck, SVU, Lucianne Lei, bus, taxi, etc.) has risks of serious injuries such as disability, being paralyzed, and death. I know and agree the risk of using [employer] is mine alone, and not [employer]. Transport Services are provided "as is" and as are available. The transportation providers are in charge for all inspections and care of the vehicles used to provide these rides. I agree not to take legal action against [facility], its agents, employees, officers, directors, representatives, insurers, attorneys, assigns, successors, subsidiaries, and affiliates at any time for any reasons related directly or indirectly to using [employer]. I also agree not to take legal action against [facility] or its affiliates for any injury, death, or damage to property caused by or related to using  [employer]. I have read this Waiver and Release of Liability, and I understand the terms used in it and their legal meaning. This Waiver is freely and voluntarily given with the understanding that my right (or any legal minors) to legal action against [place] relating to [employer] is knowingly given up to use these services.   I attest that I read the Ride Waiver and Release of Liability to Daughter [name] gave [name] the opportunity to ask questions and answered the questions asked (if any). I affirm that [name] then provided consent for assistance with transportation.                                    Transportation Exception Form  * If the practice/clinic is more than 25 miles from the patient's address, please provide the following information:    Patients Name [name] Up Date 06/20/22  Patient DOB 18-Mar-1966 Practice Location [facility]  Patient MRN [id number]    Patient Home Address [address] [place] [address]      Description of Clinical Conditions [place] Signature of Requester: Daughter [name]  DATE:  06/20/2022

## 2022-06-20 NOTE — Progress Notes (Signed)
Patient's daughter called RN and was updated on situation.  [name]

## 2022-06-21 LAB — CULTURE, BLOOD (ROUTINE X 2): Culture: NO GROWTH

## 2022-06-22 LAB — CULTURE, BLOOD (ROUTINE X 2)
Culture: NO GROWTH
Special Requests: ADEQUATE

## 2022-06-25 DIAGNOSIS — J449 Chronic obstructive pulmonary disease, unspecified: Secondary | ICD-10-CM | POA: Diagnosis not present

## 2022-07-05 ENCOUNTER — Inpatient Hospital Stay (HOSPITAL_COMMUNITY): Payer: 59

## 2022-07-05 ENCOUNTER — Other Ambulatory Visit: Payer: Self-pay

## 2022-07-05 ENCOUNTER — Encounter (HOSPITAL_COMMUNITY): Payer: Self-pay

## 2022-07-05 ENCOUNTER — Inpatient Hospital Stay (HOSPITAL_COMMUNITY)
Admission: EM | Admit: 2022-07-05 | Discharge: 2022-07-10 | DRG: 896 | Disposition: A | Discharge status: Other Acute Inpatient Hospital | Payer: 59 | Attending: Internal Medicine | Admitting: Internal Medicine

## 2022-07-05 DIAGNOSIS — R45850 Homicidal ideations: Secondary | ICD-10-CM | POA: Diagnosis not present

## 2022-07-05 DIAGNOSIS — E119 Type 2 diabetes mellitus without complications: Secondary | ICD-10-CM | POA: Diagnosis not present

## 2022-07-05 DIAGNOSIS — A539 Syphilis, unspecified: Secondary | ICD-10-CM | POA: Diagnosis present

## 2022-07-05 DIAGNOSIS — Z720 Tobacco use: Secondary | ICD-10-CM | POA: Diagnosis not present

## 2022-07-05 DIAGNOSIS — R4689 Other symptoms and signs involving appearance and behavior: Secondary | ICD-10-CM | POA: Diagnosis not present

## 2022-07-05 DIAGNOSIS — Z825 Family history of asthma and other chronic lower respiratory diseases: Secondary | ICD-10-CM

## 2022-07-05 DIAGNOSIS — T1491XA Suicide attempt, initial encounter: Secondary | ICD-10-CM | POA: Diagnosis not present

## 2022-07-05 DIAGNOSIS — M79632 Pain in left forearm: Secondary | ICD-10-CM | POA: Diagnosis not present

## 2022-07-05 DIAGNOSIS — T50904A Poisoning by unspecified drugs, medicaments and biological substances, undetermined, initial encounter: Secondary | ICD-10-CM

## 2022-07-05 DIAGNOSIS — F1994 Other psychoactive substance use, unspecified with psychoactive substance-induced mood disorder: Secondary | ICD-10-CM | POA: Diagnosis not present

## 2022-07-05 DIAGNOSIS — E43 Unspecified severe protein-calorie malnutrition: Secondary | ICD-10-CM | POA: Diagnosis present

## 2022-07-05 DIAGNOSIS — Z9884 Bariatric surgery status: Secondary | ICD-10-CM

## 2022-07-05 DIAGNOSIS — Z781 Physical restraint status: Secondary | ICD-10-CM

## 2022-07-05 DIAGNOSIS — Z1152 Encounter for screening for COVID-19: Secondary | ICD-10-CM

## 2022-07-05 DIAGNOSIS — K219 Gastro-esophageal reflux disease without esophagitis: Secondary | ICD-10-CM | POA: Diagnosis present

## 2022-07-05 DIAGNOSIS — G43909 Migraine, unspecified, not intractable, without status migrainosus: Secondary | ICD-10-CM | POA: Diagnosis present

## 2022-07-05 DIAGNOSIS — F4325 Adjustment disorder with mixed disturbance of emotions and conduct: Secondary | ICD-10-CM | POA: Diagnosis present

## 2022-07-05 DIAGNOSIS — I4891 Unspecified atrial fibrillation: Secondary | ICD-10-CM | POA: Diagnosis not present

## 2022-07-05 DIAGNOSIS — Z635 Disruption of family by separation and divorce: Secondary | ICD-10-CM

## 2022-07-05 DIAGNOSIS — F250 Schizoaffective disorder, bipolar type: Secondary | ICD-10-CM | POA: Diagnosis present

## 2022-07-05 DIAGNOSIS — Z886 Allergy status to analgesic agent status: Secondary | ICD-10-CM

## 2022-07-05 DIAGNOSIS — F1010 Alcohol abuse, uncomplicated: Secondary | ICD-10-CM | POA: Diagnosis present

## 2022-07-05 DIAGNOSIS — E1140 Type 2 diabetes mellitus with diabetic neuropathy, unspecified: Secondary | ICD-10-CM | POA: Diagnosis present

## 2022-07-05 DIAGNOSIS — I2699 Other pulmonary embolism without acute cor pulmonale: Secondary | ICD-10-CM | POA: Diagnosis present

## 2022-07-05 DIAGNOSIS — E869 Volume depletion, unspecified: Secondary | ICD-10-CM | POA: Diagnosis present

## 2022-07-05 DIAGNOSIS — T782XXA Anaphylactic shock, unspecified, initial encounter: Secondary | ICD-10-CM | POA: Diagnosis present

## 2022-07-05 DIAGNOSIS — T391X1A Poisoning by 4-Aminophenol derivatives, accidental (unintentional), initial encounter: Secondary | ICD-10-CM | POA: Diagnosis present

## 2022-07-05 DIAGNOSIS — F1914 Other psychoactive substance abuse with psychoactive substance-induced mood disorder: Secondary | ICD-10-CM | POA: Diagnosis present

## 2022-07-05 DIAGNOSIS — I959 Hypotension, unspecified: Secondary | ICD-10-CM | POA: Diagnosis present

## 2022-07-05 DIAGNOSIS — S42255D Nondisplaced fracture of greater tuberosity of left humerus, subsequent encounter for fracture with routine healing: Secondary | ICD-10-CM

## 2022-07-05 DIAGNOSIS — Y903 Blood alcohol level of 60-79 mg/100 ml: Secondary | ICD-10-CM | POA: Diagnosis present

## 2022-07-05 DIAGNOSIS — I10 Essential (primary) hypertension: Secondary | ICD-10-CM

## 2022-07-05 DIAGNOSIS — R000 Tachycardia, unspecified: Secondary | ICD-10-CM | POA: Diagnosis not present

## 2022-07-05 DIAGNOSIS — G8929 Other chronic pain: Secondary | ICD-10-CM | POA: Diagnosis present

## 2022-07-05 DIAGNOSIS — F17210 Nicotine dependence, cigarettes, uncomplicated: Secondary | ICD-10-CM | POA: Diagnosis not present

## 2022-07-05 DIAGNOSIS — T391X2A Poisoning by 4-Aminophenol derivatives, intentional self-harm, initial encounter: Secondary | ICD-10-CM | POA: Diagnosis present

## 2022-07-05 DIAGNOSIS — S42202A Unspecified fracture of upper end of left humerus, initial encounter for closed fracture: Secondary | ICD-10-CM | POA: Diagnosis not present

## 2022-07-05 DIAGNOSIS — Z79899 Other long term (current) drug therapy: Secondary | ICD-10-CM | POA: Diagnosis not present

## 2022-07-05 DIAGNOSIS — F1414 Cocaine abuse with cocaine-induced mood disorder: Secondary | ICD-10-CM | POA: Diagnosis present

## 2022-07-05 DIAGNOSIS — Z9151 Personal history of suicidal behavior: Secondary | ICD-10-CM

## 2022-07-05 DIAGNOSIS — E785 Hyperlipidemia, unspecified: Secondary | ICD-10-CM | POA: Diagnosis not present

## 2022-07-05 DIAGNOSIS — F419 Anxiety disorder, unspecified: Secondary | ICD-10-CM | POA: Diagnosis present

## 2022-07-05 DIAGNOSIS — E039 Hypothyroidism, unspecified: Secondary | ICD-10-CM | POA: Diagnosis not present

## 2022-07-05 DIAGNOSIS — J449 Chronic obstructive pulmonary disease, unspecified: Secondary | ICD-10-CM | POA: Diagnosis present

## 2022-07-05 DIAGNOSIS — S42295A Other nondisplaced fracture of upper end of left humerus, initial encounter for closed fracture: Secondary | ICD-10-CM | POA: Diagnosis not present

## 2022-07-05 DIAGNOSIS — Z8249 Family history of ischemic heart disease and other diseases of the circulatory system: Secondary | ICD-10-CM

## 2022-07-05 DIAGNOSIS — J9811 Atelectasis: Secondary | ICD-10-CM | POA: Diagnosis not present

## 2022-07-05 DIAGNOSIS — Z681 Body mass index (BMI) 19 or less, adult: Secondary | ICD-10-CM

## 2022-07-05 DIAGNOSIS — S42252D Displaced fracture of greater tuberosity of left humerus, subsequent encounter for fracture with routine healing: Secondary | ICD-10-CM

## 2022-07-05 DIAGNOSIS — Z86711 Personal history of pulmonary embolism: Secondary | ICD-10-CM

## 2022-07-05 DIAGNOSIS — J441 Chronic obstructive pulmonary disease with (acute) exacerbation: Secondary | ICD-10-CM | POA: Diagnosis present

## 2022-07-05 DIAGNOSIS — E872 Acidosis, unspecified: Secondary | ICD-10-CM | POA: Diagnosis present

## 2022-07-05 DIAGNOSIS — Z833 Family history of diabetes mellitus: Secondary | ICD-10-CM

## 2022-07-05 DIAGNOSIS — F259 Schizoaffective disorder, unspecified: Secondary | ICD-10-CM | POA: Diagnosis present

## 2022-07-05 DIAGNOSIS — M549 Dorsalgia, unspecified: Secondary | ICD-10-CM | POA: Diagnosis present

## 2022-07-05 DIAGNOSIS — F1910 Other psychoactive substance abuse, uncomplicated: Secondary | ICD-10-CM

## 2022-07-05 DIAGNOSIS — T50912A Poisoning by multiple unspecified drugs, medicaments and biological substances, intentional self-harm, initial encounter: Secondary | ICD-10-CM | POA: Diagnosis present

## 2022-07-05 DIAGNOSIS — Z888 Allergy status to other drugs, medicaments and biological substances status: Secondary | ICD-10-CM

## 2022-07-05 DIAGNOSIS — T391X4D Poisoning by 4-Aminophenol derivatives, undetermined, subsequent encounter: Secondary | ICD-10-CM | POA: Diagnosis not present

## 2022-07-05 LAB — CBC WITH DIFFERENTIAL/PLATELET
Abs Immature Granulocytes: 0.01 10*3/uL (ref 0.00–0.07)
Basophils Absolute: 0 10*3/uL (ref 0.0–0.1)
Basophils Relative: 1 %
Eosinophils Absolute: 0 10*3/uL (ref 0.0–0.5)
Eosinophils Relative: 1 %
HCT: 37.2 % (ref 36.0–46.0)
Hemoglobin: 12.4 g/dL (ref 12.0–15.0)
Immature Granulocytes: 0 %
Lymphocytes Relative: 48 %
Lymphs Abs: 2.5 10*3/uL (ref 0.7–4.0)
MCH: 30.9 pg (ref 26.0–34.0)
MCHC: 33.3 g/dL (ref 30.0–36.0)
MCV: 92.8 fL (ref 80.0–100.0)
Monocytes Absolute: 0.4 10*3/uL (ref 0.1–1.0)
Monocytes Relative: 7 %
Neutro Abs: 2.2 10*3/uL (ref 1.7–7.7)
Neutrophils Relative %: 43 %
Platelets: 269 10*3/uL (ref 150–400)
RBC: 4.01 MIL/uL (ref 3.87–5.11)
RDW: 14.3 % (ref 11.5–15.5)
WBC: 5.1 10*3/uL (ref 4.0–10.5)
nRBC: 0 % (ref 0.0–0.2)

## 2022-07-05 LAB — COMPREHENSIVE METABOLIC PANEL
ALT: 27 U/L (ref 0–44)
AST: 23 U/L (ref 15–41)
Albumin: 3.7 g/dL (ref 3.5–5.0)
Alkaline Phosphatase: 83 U/L (ref 38–126)
Anion gap: 15 (ref 5–15)
BUN: 8 mg/dL (ref 6–20)
CO2: 19 mmol/L — ABNORMAL LOW (ref 22–32)
Calcium: 8.7 mg/dL — ABNORMAL LOW (ref 8.9–10.3)
Chloride: 102 mmol/L (ref 98–111)
Creatinine, Ser: 0.51 mg/dL (ref 0.44–1.00)
GFR, Estimated: >60 mL/min (ref >60)
Glucose, Bld: 152 mg/dL — ABNORMAL HIGH (ref 70–99)
Potassium: 3.9 mmol/L (ref 3.5–5.1)
Sodium: 136 mmol/L (ref 135–145)
Total Bilirubin: 0.6 mg/dL (ref 0.3–1.2)
Total Protein: 6.5 g/dL (ref 6.5–8.1)

## 2022-07-05 LAB — I-STAT BETA HCG BLOOD, ED (MC, WL, AP ONLY): I-stat hCG, quantitative: <5 m[IU]/mL (ref <5)

## 2022-07-05 LAB — CBC
HCT: 36.8 % (ref 36.0–46.0)
Hemoglobin: 12.1 g/dL (ref 12.0–15.0)
MCH: 30.5 pg (ref 26.0–34.0)
MCHC: 32.9 g/dL (ref 30.0–36.0)
MCV: 92.7 fL (ref 80.0–100.0)
Platelets: 162 10*3/uL (ref 150–400)
RBC: 3.97 MIL/uL (ref 3.87–5.11)
RDW: 14.5 % (ref 11.5–15.5)
WBC: 4.3 10*3/uL (ref 4.0–10.5)
nRBC: 0 % (ref 0.0–0.2)

## 2022-07-05 LAB — LACTIC ACID, PLASMA
Lactic Acid, Venous: 1.1 mmol/L (ref 0.5–1.9)
Lactic Acid, Venous: 4.5 mmol/L (ref 0.5–1.9)

## 2022-07-05 LAB — URINALYSIS, ROUTINE W REFLEX MICROSCOPIC
Bilirubin Urine: NEGATIVE
Glucose, UA: NEGATIVE mg/dL
Hgb urine dipstick: NEGATIVE
Ketones, ur: NEGATIVE mg/dL
Leukocytes,Ua: NEGATIVE
Nitrite: NEGATIVE
Protein, ur: NEGATIVE mg/dL
Specific Gravity, Urine: 1.004 — ABNORMAL LOW (ref 1.005–1.030)
pH: 5 (ref 5.0–8.0)

## 2022-07-05 LAB — RESP PANEL BY RT-PCR (RSV, FLU A&B, COVID)  RVPGX2
Influenza A by PCR: NEGATIVE
Influenza B by PCR: NEGATIVE
Resp Syncytial Virus by PCR: NEGATIVE
SARS Coronavirus 2 by RT PCR: NEGATIVE

## 2022-07-05 LAB — CREATININE, SERUM
Creatinine, Ser: 0.48 mg/dL (ref 0.44–1.00)
GFR, Estimated: >60 mL/min (ref >60)

## 2022-07-05 LAB — RAPID URINE DRUG SCREEN, HOSP PERFORMED
Amphetamines: NOT DETECTED
Barbiturates: NOT DETECTED
Benzodiazepines: NOT DETECTED
Cocaine: POSITIVE — AB
Opiates: NOT DETECTED
Tetrahydrocannabinol: NOT DETECTED

## 2022-07-05 LAB — ACETAMINOPHEN LEVEL
Acetaminophen (Tylenol), Serum: <10 ug/mL — ABNORMAL LOW (ref 10–30)
Acetaminophen (Tylenol), Serum: <10 ug/mL — ABNORMAL LOW (ref 10–30)
Acetaminophen (Tylenol), Serum: <10 ug/mL — ABNORMAL LOW (ref 10–30)

## 2022-07-05 LAB — RPR: RPR Ser Ql: NONREACTIVE

## 2022-07-05 LAB — CBG MONITORING, ED
Glucose-Capillary: 167 mg/dL — ABNORMAL HIGH (ref 70–99)
Glucose-Capillary: 138 mg/dL — ABNORMAL HIGH (ref 70–99)

## 2022-07-05 LAB — SALICYLATE LEVEL
Salicylate Lvl: <7 mg/dL — ABNORMAL LOW (ref 7.0–30.0)
Salicylate Lvl: <7 mg/dL — ABNORMAL LOW (ref 7.0–30.0)

## 2022-07-05 LAB — ETHANOL: Alcohol, Ethyl (B): 71 mg/dL — ABNORMAL HIGH (ref <10)

## 2022-07-05 LAB — HIV ANTIBODY (ROUTINE TESTING W REFLEX): HIV Screen 4th Generation wRfx: NONREACTIVE

## 2022-07-05 MED ORDER — NICOTINE 14 MG/24HR TD PT24
14.0000 mg | MEDICATED_PATCH | Freq: Every day | TRANSDERMAL | Status: DC
  Administered 2022-07-05 – 2022-07-09 (×5): 14 mg via TRANSDERMAL
  Filled 2022-07-05 (×5): qty 1

## 2022-07-05 MED ORDER — IBUPROFEN 200 MG PO TABS
400.0000 mg | ORAL_TABLET | Freq: Four times a day (QID) | ORAL | Status: DC | PRN
  Administered 2022-07-05 – 2022-07-10 (×7): 400 mg via ORAL
  Filled 2022-07-05 (×8): qty 2

## 2022-07-05 MED ORDER — LACTATED RINGERS IV BOLUS
500.0000 mL | Freq: Once | INTRAVENOUS | Status: AC
  Administered 2022-07-05: 500 mL via INTRAVENOUS

## 2022-07-05 MED ORDER — THIAMINE HCL 100 MG/ML IJ SOLN
100.0000 mg | Freq: Every day | INTRAMUSCULAR | Status: DC
  Administered 2022-07-05 – 2022-07-07 (×2): 100 mg via INTRAVENOUS
  Filled 2022-07-05 (×2): qty 2

## 2022-07-05 MED ORDER — ONDANSETRON HCL 4 MG PO TABS
4.0000 mg | ORAL_TABLET | Freq: Four times a day (QID) | ORAL | Status: DC | PRN
  Administered 2022-07-09: 4 mg via ORAL
  Filled 2022-07-05 (×2): qty 1

## 2022-07-05 MED ORDER — ALBUTEROL SULFATE (2.5 MG/3ML) 0.083% IN NEBU
2.5000 mg | INHALATION_SOLUTION | RESPIRATORY_TRACT | Status: DC | PRN
  Administered 2022-07-09: 2.5 mg via RESPIRATORY_TRACT
  Filled 2022-07-05: qty 3

## 2022-07-05 MED ORDER — LACTATED RINGERS IV BOLUS
1000.0000 mL | Freq: Once | INTRAVENOUS | Status: AC
  Administered 2022-07-05: 1000 mL via INTRAVENOUS

## 2022-07-05 MED ORDER — DIPHENHYDRAMINE HCL 50 MG/ML IJ SOLN
INTRAMUSCULAR | Status: AC
  Administered 2022-07-05: 25 mg via INTRAMUSCULAR
  Filled 2022-07-05: qty 1

## 2022-07-05 MED ORDER — SODIUM CHLORIDE 0.9 % IV SOLN: INTRAVENOUS | Status: DC

## 2022-07-05 MED ORDER — ONDANSETRON HCL 4 MG/2ML IJ SOLN: 4.0000 mg | Freq: Four times a day (QID) | INTRAMUSCULAR | Status: DC | PRN

## 2022-07-05 MED ORDER — ENOXAPARIN SODIUM 40 MG/0.4ML IJ SOSY
40.0000 mg | PREFILLED_SYRINGE | Freq: Every day | INTRAMUSCULAR | Status: DC
  Administered 2022-07-08: 40 mg via SUBCUTANEOUS
  Filled 2022-07-05 (×2): qty 0.4

## 2022-07-05 MED ORDER — TRAMADOL HCL 50 MG PO TABS
50.0000 mg | ORAL_TABLET | Freq: Four times a day (QID) | ORAL | Status: DC | PRN
  Administered 2022-07-05 – 2022-07-08 (×7): 50 mg via ORAL
  Filled 2022-07-05 (×7): qty 1

## 2022-07-05 MED ORDER — ZIPRASIDONE MESYLATE 20 MG IM SOLR
20.0000 mg | Freq: Once | INTRAMUSCULAR | Status: AC
  Administered 2022-07-05: 20 mg via INTRAMUSCULAR
  Filled 2022-07-05: qty 20

## 2022-07-05 MED ORDER — SENNOSIDES-DOCUSATE SODIUM 8.6-50 MG PO TABS: 1.0000 | ORAL_TABLET | Freq: Every evening | ORAL | Status: DC | PRN

## 2022-07-05 MED ORDER — STERILE WATER FOR INJECTION IJ SOLN
INTRAMUSCULAR | Status: AC
  Administered 2022-07-05: 10 mL
  Filled 2022-07-05: qty 10

## 2022-07-05 MED ORDER — ZIPRASIDONE MESYLATE 20 MG IM SOLR
10.0000 mg | Freq: Four times a day (QID) | INTRAMUSCULAR | Status: DC | PRN
  Administered 2022-07-05 – 2022-07-08 (×5): 10 mg via INTRAMUSCULAR
  Filled 2022-07-05 (×6): qty 20

## 2022-07-05 NOTE — Progress Notes (Signed)
Date and time results received: 07/05/22 1610  Test: Lactic Acid  Critical Value: 4.5  Name of Provider Notified: [facility]  See new orders.

## 2022-07-05 NOTE — Plan of Care (Signed)
  Problem: Safety: Goal: Non-violent Restraint(s) Outcome: Not Progressing   Problem: Education: Goal: Knowledge of General Education information will improve Description: Including pain rating scale, medication(s)/side effects and non-pharmacologic comfort measures Outcome: Not Progressing   Problem: Health Behavior/Discharge Planning: Goal: Ability to manage health-related needs will improve Outcome: Not Progressing  Refused to listen
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
## 2022-07-05 NOTE — Plan of Care (Signed)
  Problem: Nutrition: Goal: Adequate nutrition will be maintained Outcome: Progressing   Problem: Pain Managment: Goal: General experience of comfort will improve Outcome: Progressing
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
## 2022-07-05 NOTE — Progress Notes (Signed)
Arrived to assess IV per consult.  Pt is agitated and uncooperative; removed IV.  Primary nurse is aware and bedside.
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
## 2022-07-05 NOTE — ED Notes (Signed)
Iv attempted by this paramedic x2. Nurse Roselyn Reef x2. Iv team order placed

## 2022-07-06 ENCOUNTER — Inpatient Hospital Stay (HOSPITAL_COMMUNITY): Payer: 59

## 2022-07-06 DIAGNOSIS — F1994 Other psychoactive substance use, unspecified with psychoactive substance-induced mood disorder: Secondary | ICD-10-CM | POA: Diagnosis not present

## 2022-07-06 DIAGNOSIS — E872 Acidosis, unspecified: Secondary | ICD-10-CM

## 2022-07-06 DIAGNOSIS — R4689 Other symptoms and signs involving appearance and behavior: Secondary | ICD-10-CM | POA: Diagnosis not present

## 2022-07-06 DIAGNOSIS — F250 Schizoaffective disorder, bipolar type: Secondary | ICD-10-CM | POA: Diagnosis not present

## 2022-07-06 DIAGNOSIS — F1910 Other psychoactive substance abuse, uncomplicated: Secondary | ICD-10-CM | POA: Diagnosis not present

## 2022-07-06 LAB — CBC WITH DIFFERENTIAL/PLATELET
Abs Immature Granulocytes: 0.02 10*3/uL (ref 0.00–0.07)
Basophils Absolute: 0 10*3/uL (ref 0.0–0.1)
Basophils Relative: 1 %
Eosinophils Absolute: 0.1 10*3/uL (ref 0.0–0.5)
Eosinophils Relative: 2 %
HCT: 39.3 % (ref 36.0–46.0)
Hemoglobin: 12.2 g/dL (ref 12.0–15.0)
Immature Granulocytes: 0 %
Lymphocytes Relative: 50 %
Lymphs Abs: 2.6 10*3/uL (ref 0.7–4.0)
MCH: 29.8 pg (ref 26.0–34.0)
MCHC: 31 g/dL (ref 30.0–36.0)
MCV: 95.9 fL (ref 80.0–100.0)
Monocytes Absolute: 0.5 10*3/uL (ref 0.1–1.0)
Monocytes Relative: 9 %
Neutro Abs: 2 10*3/uL (ref 1.7–7.7)
Neutrophils Relative %: 38 %
Platelets: 232 10*3/uL (ref 150–400)
RBC: 4.1 MIL/uL (ref 3.87–5.11)
RDW: 14.5 % (ref 11.5–15.5)
WBC: 5.3 10*3/uL (ref 4.0–10.5)
nRBC: 0 % (ref 0.0–0.2)

## 2022-07-06 LAB — COMPREHENSIVE METABOLIC PANEL
ALT: 20 U/L (ref 0–44)
AST: 23 U/L (ref 15–41)
Albumin: 3.1 g/dL — ABNORMAL LOW (ref 3.5–5.0)
Alkaline Phosphatase: 75 U/L (ref 38–126)
Anion gap: 8 (ref 5–15)
BUN: 8 mg/dL (ref 6–20)
CO2: 26 mmol/L (ref 22–32)
Calcium: 8.3 mg/dL — ABNORMAL LOW (ref 8.9–10.3)
Chloride: 107 mmol/L (ref 98–111)
Creatinine, Ser: 0.51 mg/dL (ref 0.44–1.00)
GFR, Estimated: >60 mL/min (ref >60)
Glucose, Bld: 121 mg/dL — ABNORMAL HIGH (ref 70–99)
Potassium: 3.7 mmol/L (ref 3.5–5.1)
Sodium: 141 mmol/L (ref 135–145)
Total Bilirubin: 0.5 mg/dL (ref 0.3–1.2)
Total Protein: 6 g/dL — ABNORMAL LOW (ref 6.5–8.1)

## 2022-07-06 LAB — LACTIC ACID, PLASMA
Lactic Acid, Venous: 3.4 mmol/L (ref 0.5–1.9)
Lactic Acid, Venous: 2.8 mmol/L (ref 0.5–1.9)

## 2022-07-06 LAB — VITAMIN B12: Vitamin B-12: 317 pg/mL (ref 180–914)

## 2022-07-06 MED ORDER — ZIPRASIDONE MESYLATE 20 MG IM SOLR
10.0000 mg | INTRAMUSCULAR | Status: AC
  Administered 2022-07-06: 10 mg via INTRAMUSCULAR
  Filled 2022-07-06: qty 20

## 2022-07-06 MED ORDER — LORAZEPAM 1 MG PO TABS
1.0000 mg | ORAL_TABLET | ORAL | Status: DC | PRN
  Administered 2022-07-06 – 2022-07-08 (×5): 2 mg via ORAL
  Filled 2022-07-06 (×3): qty 2

## 2022-07-06 MED ORDER — LORAZEPAM 2 MG/ML IJ SOLN
1.0000 mg | INTRAMUSCULAR | Status: DC | PRN
  Filled 2022-07-06: qty 1

## 2022-07-06 MED ORDER — LORAZEPAM 1 MG PO TABS
0.0000 mg | ORAL_TABLET | Freq: Four times a day (QID) | ORAL | Status: AC
  Filled 2022-07-06 (×2): qty 2

## 2022-07-06 MED ORDER — QUETIAPINE FUMARATE ER 200 MG PO TB24
200.0000 mg | ORAL_TABLET | Freq: Every day | ORAL | Status: DC
  Administered 2022-07-07 – 2022-07-09 (×4): 200 mg via ORAL
  Filled 2022-07-06 (×4): qty 1

## 2022-07-06 MED ORDER — THIAMINE MONONITRATE 100 MG PO TABS
100.0000 mg | ORAL_TABLET | Freq: Every day | ORAL | Status: DC
  Administered 2022-07-06 – 2022-07-10 (×4): 100 mg via ORAL
  Filled 2022-07-06 (×5): qty 1

## 2022-07-06 MED ORDER — THIAMINE HCL 100 MG/ML IJ SOLN
100.0000 mg | Freq: Every day | INTRAMUSCULAR | Status: DC
  Filled 2022-07-06: qty 2

## 2022-07-06 MED ORDER — LORAZEPAM 1 MG PO TABS: 0.0000 mg | ORAL_TABLET | Freq: Two times a day (BID) | ORAL | Status: AC

## 2022-07-06 MED ORDER — FOLIC ACID 1 MG PO TABS
1.0000 mg | ORAL_TABLET | Freq: Every day | ORAL | Status: DC
  Administered 2022-07-06 – 2022-07-10 (×4): 1 mg via ORAL
  Filled 2022-07-06 (×5): qty 1

## 2022-07-06 NOTE — Progress Notes (Signed)
CROSS COVER NOTE  NAME: OLAYINKA GATHERS MRN: [id number] DOB : 1965/11/15    Date of Service   07/06/2022   HPI/Events of Note   Despite being on 4 point restraint and receiving Geodon, patient has been able to remove medical equipment, multiple IVs, and is verbally abusive to staff.  Per RN, patient is alert and oriented x 4, but highly uncooperative with medical care plan.  An additional dose of  10 mg IM Geodon to be given at this time.    Interventions/ Plan   Patient will Geodon dose       Raenette Rover, DNP, [place]
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
## 2022-07-06 NOTE — Consult Note (Addendum)
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 Debby, MD   14 mg at 07/06/22 1026   ondansetron (ZOFRAN) tablet 4 mg  4 mg Oral Q6H PRN Crosley, Debby, MD       Or   ondansetron (ZOFRAN) injection 4 mg  4 mg Intravenous Q6H PRN Crosley, Debby, MD       QUEtiapine (SEROQUEL XR) 24 hr tablet 200 mg  200 mg Oral QHS Tomeko Scoville A       senna-docusate (Senokot-S) tablet 1 tablet  1 tablet Oral QHS PRN Crosley, Debby, MD       thiamine (VITAMIN B1) injection 100 mg  100 mg Intravenous  Daily Regalado, Belkys A, MD   100 mg at 07/05/22 1422   thiamine (VITAMIN B1) tablet 100 mg  100 mg Oral Daily Regalado, Belkys A, MD   100 mg at 07/06/22 1025   Or   thiamine (VITAMIN B1) injection 100 mg  100 mg Intravenous Daily Regalado, Belkys A, MD       traMADol (ULTRAM) tablet 50 mg  50 mg Oral Q6H PRN Regalado, Belkys A, MD   50 mg at 07/06/22 1025   ziprasidone (GEODON) injection 10 mg  10 mg Intramuscular Q6H PRN Quintella Baton, MD   10 mg at 07/06/22 0619    Musculoskeletal:  Psychiatric Specialty Exam:  Presentation  General Appearance:  Appropriate for Environment; Casual  Eye Contact: Good  Speech: Clear and Coherent; Normal Rate  Speech Volume: Normal  Handedness: Right   Mood and Affect  Mood: -- (Frustrated)  Affect: Congruent; Full Range   Thought Process  Thought Processes: Coherent; Linear; Goal Directed  Descriptions of Associations:Intact  Orientation:Full (Time, Place and Person)  Thought Content:Logical  History of Schizophrenia/Schizoaffective disorder:No data recorded Duration of Psychotic Symptoms:No data recorded Hallucinations:Hallucinations: None  Ideas of Reference:None  Suicidal Thoughts:Suicidal Thoughts: No  Homicidal Thoughts:Homicidal Thoughts: No   Sensorium  Memory: Immediate Good; Recent Fair; Remote Fair  Judgment: Fair  Insight: Poor   Executive Functions  Concentration: Fair  Attention Span: Fair  Recall: [place] of Knowledge: Good  Language: Good   Psychomotor Activity  Psychomotor Activity:Psychomotor Activity: Increased (Constantly tapping L foot, no orofacial movements or dyskinesias, states nervous tic)   Assets  Assets: Social Support   Sleep  Sleep:Sleep: Good   Physical Exam: Physical Exam Vitals and nursing note reviewed.  Constitutional:      Appearance: Normal appearance.  Neurological:     Mental Status: She is alert and oriented to person, place, and  time.  Psychiatric:        Mood and Affect: Mood normal.        Behavior: Behavior normal.        Thought Content: Thought content normal.    PE 1/14: no muscle rigidity or cogwheeling in 3/4 extremities. Some stiffness in LUE, exam limited d/t pain   Review of Systems  Psychiatric/Behavioral:  Positive for substance abuse.   All other systems reviewed and are negative.  Blood pressure Marland Kitchen)  148/84, pulse 89, temperature 98.2 F (36.8 C), resp. rate 16, height '5\' 2"'$  (1.575 m), weight 48.4 kg, last menstrual period 01/08/2011, SpO2 98 %. Body mass index is 19.52 kg/m.  Treatment Plan Summary: Daily contact with patient to assess and evaluate symptoms and progress in treatment and Medication management -- START quetiapine 200 mg for schizoaffective disorder, irritability, sleep -- OK to IVC pt for delivery of needed medical care using pt as surrogate consent (see my attestation from late October) - Based on multiple clinical exams, do not feel like pt will require psych hospitalization when medically clear.     Disposition: Supportive therapy provided about ongoing stressors.  Kerrie Buffalo Aasha Dina 07/06/2022 5:28 PM

## 2022-07-06 NOTE — Plan of Care (Signed)
  Problem: Safety: Goal: Non-violent Restraint(s) Outcome: Progressing   Problem: Education: Goal: Knowledge of General Education information will improve Description: Including pain rating scale, medication(s)/side effects and non-pharmacologic comfort measures Outcome: Progressing   Problem: Health Behavior/Discharge Planning: Goal: Ability to manage health-related needs will improve Outcome: Progressing

## 2022-07-06 NOTE — Progress Notes (Signed)
Patient is deeply asleep, will get her vitals and meds scheduled for tonight when she wakes up, sitter still at bedside and patient looks comfortable, will continue to monitor

## 2022-07-06 NOTE — Progress Notes (Signed)
Patient is calm now, obeying and following instructions and care orders, took her off the 4 points restraints at 0620 this morning to see how she will behave , attending A. Zebedee Iba made aware, ( check flow sheet for restraints documentation), still has order for IVC and 1:1 sitter. VSS, PRN Geodon given, asleep now will continue to monitor.

## 2022-07-07 ENCOUNTER — Inpatient Hospital Stay (HOSPITAL_COMMUNITY): Payer: 59

## 2022-07-07 DIAGNOSIS — R45850 Homicidal ideations: Secondary | ICD-10-CM | POA: Diagnosis not present

## 2022-07-07 DIAGNOSIS — F1994 Other psychoactive substance use, unspecified with psychoactive substance-induced mood disorder: Secondary | ICD-10-CM

## 2022-07-07 DIAGNOSIS — F250 Schizoaffective disorder, bipolar type: Secondary | ICD-10-CM | POA: Diagnosis not present

## 2022-07-07 DIAGNOSIS — R4689 Other symptoms and signs involving appearance and behavior: Secondary | ICD-10-CM | POA: Diagnosis not present

## 2022-07-07 DIAGNOSIS — F1910 Other psychoactive substance abuse, uncomplicated: Secondary | ICD-10-CM | POA: Diagnosis not present

## 2022-07-07 DIAGNOSIS — T50904A Poisoning by unspecified drugs, medicaments and biological substances, undetermined, initial encounter: Secondary | ICD-10-CM | POA: Diagnosis not present

## 2022-07-07 LAB — BASIC METABOLIC PANEL
Anion gap: 6 (ref 5–15)
BUN: 11 mg/dL (ref 6–20)
CO2: 21 mmol/L — ABNORMAL LOW (ref 22–32)
Calcium: 8.2 mg/dL — ABNORMAL LOW (ref 8.9–10.3)
Chloride: 110 mmol/L (ref 98–111)
Creatinine, Ser: 0.51 mg/dL (ref 0.44–1.00)
GFR, Estimated: >60 mL/min (ref >60)
Glucose, Bld: 238 mg/dL — ABNORMAL HIGH (ref 70–99)
Potassium: 3.9 mmol/L (ref 3.5–5.1)
Sodium: 137 mmol/L (ref 135–145)

## 2022-07-07 LAB — URINE CULTURE

## 2022-07-07 LAB — LACTIC ACID, PLASMA: Lactic Acid, Venous: 2.8 mmol/L (ref 0.5–1.9)

## 2022-07-07 LAB — GLUCOSE, CAPILLARY: Glucose-Capillary: 235 mg/dL — ABNORMAL HIGH (ref 70–99)

## 2022-07-07 MED ORDER — SODIUM CHLORIDE 0.9 % IV BOLUS: 500.0000 mL | Freq: Once | INTRAVENOUS | Status: DC

## 2022-07-07 MED ORDER — INSULIN ASPART 100 UNIT/ML IJ SOLN
0.0000 [IU] | Freq: Three times a day (TID) | INTRAMUSCULAR | Status: DC
  Administered 2022-07-08: 3 [IU] via SUBCUTANEOUS

## 2022-07-07 MED ORDER — GABAPENTIN 300 MG PO CAPS
300.0000 mg | ORAL_CAPSULE | Freq: Three times a day (TID) | ORAL | Status: AC
  Administered 2022-07-07 – 2022-07-09 (×6): 300 mg via ORAL
  Filled 2022-07-07 (×6): qty 1

## 2022-07-07 MED ORDER — INSULIN ASPART 100 UNIT/ML IJ SOLN
0.0000 [IU] | Freq: Every day | INTRAMUSCULAR | Status: DC
  Administered 2022-07-07: 2 [IU] via SUBCUTANEOUS
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
## 2022-07-07 NOTE — Progress Notes (Signed)
Patient restraints expired earlier, and I went ahead and DC it, she is cooperating with care and following instructions now, still has sitter at bedside, attending physician notified, will keep monitoring.

## 2022-07-07 NOTE — Progress Notes (Signed)
Orthopedic Tech Progress Note Patient Details:  [name] [birth year] [id number] Arm sling was delivered to the patient's room.  Ortho Devices Type of Ortho Device: Arm sling Ortho Device/Splint Interventions: Ordered      Rashawnda Gaba E Jerianne Anselmo 07/07/2022, 11:41 AM

## 2022-07-07 NOTE — Plan of Care (Signed)
  Problem: Safety: Goal: Non-violent Restraint(s) Outcome: Progressing   Problem: Education: Goal: Knowledge of General Education information will improve Description: Including pain rating scale, medication(s)/side effects and non-pharmacologic comfort measures Outcome: Progressing   Problem: Health Behavior/Discharge Planning: Goal: Ability to manage health-related needs will improve Outcome: Progressing

## 2022-07-07 NOTE — Progress Notes (Signed)
IVC paperwork faxed to magistrate, custody order received, [employer] called for service.  Paperwork placed on chart. Lurline Idol, MSW, LCSW 1/15/20242:32 PM
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
## 2022-07-07 NOTE — Progress Notes (Signed)
Patient refusing vital signs, blood sugars and IV fluids. States she does not know why she is here. IVC explained to patient and the necessity for psych to clear her. Patient still adamantly refusing medical intervention. Will continue attempts.

## 2022-07-07 NOTE — Progress Notes (Signed)
Patient refusing application of arm sling. States, "nobody at this hospital is putting anything else on me."
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
## 2022-07-08 ENCOUNTER — Ambulatory Visit: Payer: 59 | Admitting: Neurology

## 2022-07-08 ENCOUNTER — Encounter: Payer: Self-pay | Admitting: Neurology

## 2022-07-08 DIAGNOSIS — F1910 Other psychoactive substance abuse, uncomplicated: Secondary | ICD-10-CM | POA: Diagnosis not present

## 2022-07-08 DIAGNOSIS — S42295A Other nondisplaced fracture of upper end of left humerus, initial encounter for closed fracture: Secondary | ICD-10-CM | POA: Diagnosis not present

## 2022-07-08 DIAGNOSIS — R4689 Other symptoms and signs involving appearance and behavior: Secondary | ICD-10-CM | POA: Diagnosis not present

## 2022-07-08 DIAGNOSIS — R45850 Homicidal ideations: Secondary | ICD-10-CM | POA: Diagnosis not present

## 2022-07-08 DIAGNOSIS — T50904A Poisoning by unspecified drugs, medicaments and biological substances, undetermined, initial encounter: Secondary | ICD-10-CM | POA: Diagnosis not present

## 2022-07-08 DIAGNOSIS — S42202A Unspecified fracture of upper end of left humerus, initial encounter for closed fracture: Secondary | ICD-10-CM

## 2022-07-08 LAB — BASIC METABOLIC PANEL
Anion gap: 10 (ref 5–15)
BUN: 14 mg/dL (ref 6–20)
CO2: 19 mmol/L — ABNORMAL LOW (ref 22–32)
Calcium: 8.2 mg/dL — ABNORMAL LOW (ref 8.9–10.3)
Chloride: 104 mmol/L (ref 98–111)
Creatinine, Ser: 0.51 mg/dL (ref 0.44–1.00)
GFR, Estimated: >60 mL/min (ref >60)
Glucose, Bld: 134 mg/dL — ABNORMAL HIGH (ref 70–99)
Potassium: 4.1 mmol/L (ref 3.5–5.1)
Sodium: 133 mmol/L — ABNORMAL LOW (ref 135–145)

## 2022-07-08 LAB — GLUCOSE, CAPILLARY
Glucose-Capillary: 211 mg/dL — ABNORMAL HIGH (ref 70–99)
Glucose-Capillary: 87 mg/dL (ref 70–99)
Glucose-Capillary: 120 mg/dL — ABNORMAL HIGH (ref 70–99)
Glucose-Capillary: 158 mg/dL — ABNORMAL HIGH (ref 70–99)

## 2022-07-08 LAB — LACTIC ACID, PLASMA: Lactic Acid, Venous: 1.2 mmol/L (ref 0.5–1.9)

## 2022-07-08 MED ORDER — OXYCODONE HCL 5 MG PO TABS
5.0000 mg | ORAL_TABLET | Freq: Four times a day (QID) | ORAL | Status: DC | PRN
  Administered 2022-07-08 – 2022-07-10 (×5): 5 mg via ORAL
  Filled 2022-07-08 (×5): qty 1
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
## 2022-07-08 NOTE — Care Management Important Message (Signed)
Important Message  Patient Details  Name: [name] MRN: [id number] Date of Birth: 1965-12-02   Medicare Important Message Given:  Yes     [name] 07/08/2022, 3:42 PM

## 2022-07-08 NOTE — Plan of Care (Signed)
  Problem: Education: Goal: Knowledge of General Education information will improve Description: Including pain rating scale, medication(s)/side effects and non-pharmacologic comfort measures Outcome: Progressing   Problem: Health Behavior/Discharge Planning: Goal: Ability to manage health-related needs will improve Outcome: Progressing   Problem: Nutrition: Goal: Adequate nutrition will be maintained Outcome: Progressing   Problem: Elimination: Goal: Will not experience complications related to bowel motility Outcome: Progressing   Problem: Pain Managment: Goal: General experience of comfort will improve Outcome: Progressing
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
## 2022-07-09 DIAGNOSIS — R45850 Homicidal ideations: Secondary | ICD-10-CM | POA: Diagnosis not present

## 2022-07-09 DIAGNOSIS — S42202A Unspecified fracture of upper end of left humerus, initial encounter for closed fracture: Secondary | ICD-10-CM | POA: Diagnosis not present

## 2022-07-09 DIAGNOSIS — F250 Schizoaffective disorder, bipolar type: Secondary | ICD-10-CM | POA: Diagnosis not present

## 2022-07-09 DIAGNOSIS — F1910 Other psychoactive substance abuse, uncomplicated: Secondary | ICD-10-CM | POA: Diagnosis not present

## 2022-07-09 DIAGNOSIS — F4325 Adjustment disorder with mixed disturbance of emotions and conduct: Secondary | ICD-10-CM | POA: Diagnosis not present

## 2022-07-09 DIAGNOSIS — T50904A Poisoning by unspecified drugs, medicaments and biological substances, undetermined, initial encounter: Secondary | ICD-10-CM | POA: Diagnosis not present

## 2022-07-09 LAB — GLUCOSE, CAPILLARY
Glucose-Capillary: 329 mg/dL — ABNORMAL HIGH (ref 70–99)
Glucose-Capillary: 168 mg/dL — ABNORMAL HIGH (ref 70–99)

## 2022-07-09 LAB — RESP PANEL BY RT-PCR (RSV, FLU A&B, COVID)  RVPGX2
Influenza A by PCR: NEGATIVE
Influenza B by PCR: NEGATIVE
Resp Syncytial Virus by PCR: NEGATIVE
SARS Coronavirus 2 by RT PCR: NEGATIVE

## 2022-07-09 MED ORDER — NICOTINE 21 MG/24HR TD PT24
21.0000 mg | MEDICATED_PATCH | Freq: Every day | TRANSDERMAL | Status: DC
  Administered 2022-07-09 – 2022-07-10 (×2): 21 mg via TRANSDERMAL
  Filled 2022-07-09 (×2): qty 1

## 2022-07-09 MED ORDER — ZIPRASIDONE MESYLATE 20 MG IM SOLR
20.0000 mg | Freq: Once | INTRAMUSCULAR | Status: AC
  Administered 2022-07-09: 20 mg via INTRAMUSCULAR
  Filled 2022-07-09: qty 20

## 2022-07-09 MED ORDER — GABAPENTIN 300 MG PO CAPS
300.0000 mg | ORAL_CAPSULE | Freq: Three times a day (TID) | ORAL | Status: DC
  Administered 2022-07-09 – 2022-07-10 (×3): 300 mg via ORAL
  Filled 2022-07-09 (×3): qty 1
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
## 2022-07-09 NOTE — Progress Notes (Signed)
Mobility Specialist Progress Note   07/09/22 0935  Mobility  Activity Ambulated with assistance in hallway  Level of Assistance Independent (Refused to be touched)  Assistive Device None  Distance Ambulated (ft) 60 ft  Range of Motion/Exercises Active;All extremities  LUE Weight Bearing NWB  Activity Response Tolerated fair   Patient presented with irate behavior, anxious and agitated. Angry with Probation officer and staff, walking behind nursing desk and disrupted progressive meeting. Attempted to de-escalate but patient refusing and attempted to Advice worker with clear paper holder. Eventually security was called and care was resumed by nursing and security staff. Ambulated in hallway with steady gait. Adhered to NWB on LUE and no LOB noted.  [place] Mel Tadros, BS EXP Mobility Specialist Please contact via SecureChat or Rehab office at [phone]

## 2022-07-09 NOTE — Progress Notes (Addendum)
Patient refused CBC lab draw. Advised pt it's necessary since she reported she had blood in her BM. Patient stated she was wiping too much that's why she bled.

## 2022-07-09 NOTE — Progress Notes (Addendum)
Patient complained of nausea and vomiting and reported blood in her BM. Small  spot of blood found on her towel. Zofran given. On-call hospitalist notified.

## 2022-07-09 NOTE — Plan of Care (Signed)

## 2022-07-09 NOTE — Progress Notes (Addendum)
Progress Note    [name]   WPY:[id number]  DOB: 11-10-1965  DOA: 07/05/2022     4 PCP: [name], MD  Initial CC: agitated   Hospital Course: [name] is a 57 yo female with PMH polysubstance abuse, anxiety/depression, afib, chronic back pain, COPD, DMII, neuropathy, HTN, hypothyroidism, schizoaffective d/o, bipolar d/o, s/p gastric bypass.  Recent left shoulder nondisplaced greater tuberosity fracture with ongoing healing and sling in place for comfort. She is recommended to continue active ROM per ortho as well. She has been evaluated by psychiatry and remains under IVC and is recommended for inpatient psych placement as well.   She initially had an elevated lactic on admission which was felt due to hypoperfusion from volume depletion.  This normalized with fluid resuscitation. She was recommended for inpatient psychiatric hospitalization per psychiatry.  Interval History:  Patient remained agitated this morning.  After talking with her some, she did calm down.  Did have restraints on briefly this morning which have been able to be removed.  Sitter remains bedside.  Assessment and Plan:  Aggressive behavior Adjustment disorder with mixed disturbance of emotion and conduct Schizoaffective disorder -Appreciate psychiatry assistance - Continue IVC - Patient recommended for inpatient psychiatric hospitalization - Continue gabapentin, Seroquel -Patient is medically stable for discharge to inpatient psychiatry once found   Lactic Acidosis -resolved -Low suspicion for infection on admission - Lactic considered elevated due to hypoperfusion and volume depletion - Normalized with fluid resuscitation  Left humerus fracture -X-ray shows healing left greater tuberosity fracture with callus formation -Evaluated by orthopedic surgery, appreciate assistance - Continue sling for comfort - ROM as tolerated - Outpatient follow-up with orthopedic surgery   COPD: Albuterol  as needed   Tobacco abuse: Nicotine patch order   Polysubstance abuse UDS positive for cocaine.     Protein caloric malnutrition severe Started  Ensure   History of PE: No longer on anticoagulation   History of RPR positive: RPR non reactive.    Hypotension: IV bolus.  Work up for infection; Negative.  BP normalized.  UA;Negative.  Chest x ray: Lower lung volumes, no other acute cardiopulmonary abnormality.   Questionable Tylenol overdose: Ruled out  Related that she took excess amount of Tylenol. Tylenol level less than 10 x 3--negative.    Old records reviewed in assessment of this patient  Antimicrobials:   DVT prophylaxis:  SCDs Start: 07/05/22 0527   Code Status:   Code Status: Full Code  Mobility Assessment (last 72 hours)     Mobility Assessment     Row Name 07/09/22 0725 07/08/22 2050 07/08/22 0712 07/07/22 2009 07/07/22 1534   Does patient have an order for bedrest or is patient medically unstable No - Continue assessment No - Continue assessment No - Continue assessment No - Continue assessment --   What is the highest level of mobility based on the progressive mobility assessment? Level 6 (Walks independently in room and hall) - Balance while walking in room without assist - Complete Level 6 (Walks independently in room and hall) - Balance while walking in room without assist - Complete Level 6 (Walks independently in room and hall) - Balance while walking in room without assist - Complete Level 5 (Walks with assist in room/hall) - Balance while stepping forward/back and can walk in room with assist - Complete Level 6 (Walks independently in room and hall) - Balance while walking in room without assist - Complete    Row Name 07/07/22 0900 07/07/22 0708 07/06/22  2031       Does patient have an order for bedrest or is patient medically unstable -- No - Continue assessment No - Continue assessment     What is the highest level of mobility based on the  progressive mobility assessment? Level 6 (Walks independently in room and hall) - Balance while walking in room without assist - Complete Level 5 (Walks with assist in room/hall) - Balance while stepping forward/back and can walk in room with assist - Complete Level 5 (Walks with assist in room/hall) - Balance while stepping forward/back and can walk in room with assist - Complete              Barriers to discharge:  Disposition Plan:  Inpatient psych Status is: Inpatient  Objective: Blood pressure (!) 133/95, pulse (!) 103, temperature 97.8 F (36.6 C), temperature source Oral, resp. rate 16, height '5\' 2"'$  (1.575 m), weight 48.4 kg, last menstrual period 01/08/2011, SpO2 100 %.  Examination:  Physical Exam Constitutional:      General: She is not in acute distress.    Appearance: She is not ill-appearing.     Comments: Agitated adult woman lying in bed verbally swearing  HENT:     Head: Normocephalic and atraumatic.     Mouth/Throat:     Mouth: Mucous membranes are moist.  Eyes:     Extraocular Movements: Extraocular movements intact.  Cardiovascular:     Rate and Rhythm: Normal rate and regular rhythm.  Pulmonary:     Effort: Pulmonary effort is normal.     Breath sounds: Normal breath sounds.  Abdominal:     General: Bowel sounds are normal. There is no distension.     Palpations: Abdomen is soft.     Tenderness: There is no abdominal tenderness.  Musculoskeletal:     Cervical back: Normal range of motion and neck supple.     Comments: Left upper extremity partially in shoulder sling  Neurological:     General: No focal deficit present.      Consultants:  Psychiatry  Ortho  Procedures:    Data Reviewed: Results for orders placed or performed during the hospital encounter of 07/05/22 (from the past 24 hour(s))  Glucose, capillary     Status: Abnormal   Collection Time: 07/08/22  4:20 PM  Result Value Ref Range   Glucose-Capillary 120 (H) 70 - 99 mg/dL    Comment 1 Notify RN   Glucose, capillary     Status: Abnormal   Collection Time: 07/08/22  8:52 PM  Result Value Ref Range   Glucose-Capillary 158 (H) 70 - 99 mg/dL   Comment 1 Notify RN   Resp panel by RT-PCR (RSV, Flu A&B, Covid) Anterior Nasal Swab     Status: None   Collection Time: 07/09/22 12:15 PM   Specimen: Anterior Nasal Swab  Result Value Ref Range   SARS Coronavirus 2 by RT PCR NEGATIVE NEGATIVE   Influenza A by PCR NEGATIVE NEGATIVE   Influenza B by PCR NEGATIVE NEGATIVE   Resp Syncytial Virus by PCR NEGATIVE NEGATIVE    I have reviewed pertinent nursing notes, vitals, labs, and images as necessary. I have ordered labwork to follow up on as indicated.  I have reviewed the last notes from staff over past 24 hours. I have discussed patient's care plan and test results with nursing staff, CM/SW, and other staff as appropriate.  Time spent: Greater than 50% of the 55 minute visit was spent in counseling/coordination of care  for the patient as laid out in the A&P.   LOS: 4 days   Dwyane Dee, MD [facility] 07/09/2022, 4:18 PM

## 2022-07-09 NOTE — Progress Notes (Addendum)
Pt was looking for her personal belongings, and wanting to leave. DON aware of above, despite educating pt, she remains belligerent to staff/security. Start hitting staff/security. 4 points restraints applied as ordered. Spouse [name] has been informed of above.

## 2022-07-09 NOTE — Progress Notes (Signed)
This Probation officer has been informed by MD that Pt is calmer,and she's requesting off restraints to bathroom, and if she does well, then d/c restraints afterwards.Pt seen in room,/resting comfortably. 4point soft restraints d/c'd as ordered. Pt has no complaints.

## 2022-07-09 NOTE — TOC Progression Note (Addendum)
Transition of Care [facility]) - Progression Note    Patient Details  Name: [name] MRN: [id number] Date of Birth: 07/19/1965  Transition of Care [facility]) CM/SW Contact  Joanne Chars, LCSW Phone Number: 07/09/2022, 11:40 AM  Clinical Narrative:   CSW informed by [facility] that they do not have beds available currently.  Pt referral faxed out to [facility]: [place].  They can accept pt tomorrow, since she was in restraints today.  Will need the following: Negative covid test Pt must be restraint free and forced med free for 12 hours prior to transfer. Accepting MD is Dr Nigel Mormon, RN report [phone]. MD informed.       Expected Discharge Plan and Services                                               Social Determinants of Health (SDOH) Interventions SDOH Screenings   Food Insecurity: No Food Insecurity (05/06/2022)  Housing: Low Risk  (05/06/2022)  Transportation Needs: No Transportation Needs (05/06/2022)  Utilities: Not At Risk (05/06/2022)  Alcohol Screen: Medium Risk (08/04/2018)  Social Connections: Moderately Isolated (05/06/2022)  Tobacco Use: High Risk (07/05/2022)    Readmission Risk Interventions    04/28/2022    2:18 PM 04/07/2022   12:41 PM 07/27/2020   11:06 AM  Readmission Risk Prevention Plan  Transportation Screening Complete Complete Complete  PCP or Specialist Appt within 5-7 Days   Complete  Home Care Screening   Complete  [place] or Home Care Consult  Complete   Social Work Consult for [place] Planning/Counseling  Complete   Palliative Care Screening  Not Applicable   Medication Review [employer] photographer) Complete Complete   PCP or Specialist appointment within 3-5 days of discharge Complete    HRI or [place] Complete    SW Recovery Care/Counseling Consult Complete    Palliative Care Screening Not [place] Not Applicable

## 2022-07-09 NOTE — Progress Notes (Signed)
Pt refused blood sugar checks/scheduled meds Pt has been educated the necessity of taking her scheduled meds,, as well as getting her blood sugars,but pt declined., Attending md made aware with no new orders at this time

## 2022-07-09 NOTE — Progress Notes (Signed)
Lab called to advise patient has declined lab draw and requested them not to return

## 2022-07-09 NOTE — Progress Notes (Signed)
This Probation officer has been informed that pt while ambulatiing  with sitter started removing piv lines,/ hospital gown and belligerent to staff. Pt escorted back to room. And c/o " I couldn't breathe" Oxygen offered but pt refused, RT/rapid called for safety. Prn albuteriol offered by RT, Pt declined. ,VSS, Geodon x1 given as ordered. Pt in bed resting comfortably.
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
## 2022-07-09 NOTE — Hospital Course (Signed)
[name] is a 57 yo female with PMH polysubstance abuse, anxiety/depression, afib, chronic back pain, COPD, DMII, neuropathy, HTN, hypothyroidism, schizoaffective d/o, bipolar d/o, s/p gastric bypass.  Recent left shoulder nondisplaced greater tuberosity fracture with ongoing healing and sling in place for comfort. She is recommended to continue active ROM per ortho as well. She has been evaluated by psychiatry and remains under IVC and is recommended for inpatient psych placement as well.   She initially had an elevated lactic on admission which was felt due to hypoperfusion from volume depletion.  This normalized with fluid resuscitation. She was recommended for inpatient psychiatric hospitalization per psychiatry.

## 2022-07-10 ENCOUNTER — Encounter (HOSPITAL_COMMUNITY): Payer: Self-pay | Admitting: Family Medicine

## 2022-07-10 DIAGNOSIS — T50912A Poisoning by multiple unspecified drugs, medicaments and biological substances, intentional self-harm, initial encounter: Secondary | ICD-10-CM | POA: Diagnosis present

## 2022-07-10 DIAGNOSIS — S42202A Unspecified fracture of upper end of left humerus, initial encounter for closed fracture: Secondary | ICD-10-CM | POA: Diagnosis not present

## 2022-07-10 DIAGNOSIS — F4325 Adjustment disorder with mixed disturbance of emotions and conduct: Secondary | ICD-10-CM | POA: Diagnosis not present

## 2022-07-10 DIAGNOSIS — Z720 Tobacco use: Secondary | ICD-10-CM

## 2022-07-10 DIAGNOSIS — T1491XA Suicide attempt, initial encounter: Secondary | ICD-10-CM

## 2022-07-10 DIAGNOSIS — T391X4D Poisoning by 4-Aminophenol derivatives, undetermined, subsequent encounter: Secondary | ICD-10-CM

## 2022-07-10 DIAGNOSIS — F250 Schizoaffective disorder, bipolar type: Secondary | ICD-10-CM | POA: Diagnosis not present

## 2022-07-10 DIAGNOSIS — F1910 Other psychoactive substance abuse, uncomplicated: Secondary | ICD-10-CM | POA: Diagnosis not present

## 2022-07-10 LAB — GLUCOSE, CAPILLARY: Glucose-Capillary: 138 mg/dL — ABNORMAL HIGH (ref 70–99)

## 2022-07-10 MED ORDER — IBUPROFEN 400 MG PO TABS: 400.0000 mg | ORAL_TABLET | Freq: Four times a day (QID) | ORAL | 0 refills | Status: DC | PRN

## 2022-07-10 MED ORDER — QUETIAPINE FUMARATE ER 200 MG PO TB24: 200.0000 mg | ORAL_TABLET | Freq: Every day | ORAL | Status: DC

## 2022-07-10 MED ORDER — GABAPENTIN 300 MG PO CAPS: 300.0000 mg | ORAL_CAPSULE | Freq: Three times a day (TID) | ORAL | Status: DC

## 2022-07-10 MED ORDER — NICOTINE 21 MG/24HR TD PT24: 21.0000 mg | MEDICATED_PATCH | Freq: Every day | TRANSDERMAL | 0 refills | Status: DC

## 2022-07-10 MED ORDER — SENNOSIDES-DOCUSATE SODIUM 8.6-50 MG PO TABS: 1.0000 | ORAL_TABLET | Freq: Every evening | ORAL | Status: DC | PRN

## 2022-07-10 NOTE — Consult Note (Signed)
   [facility] CM Inpatient Consult   07/10/2022  [name] 11-06-1965 [id number]  [facility] Referral:  Extreme High risk patient list for unplanned readmission with less than 30 days readmission review  Primary Care Provider: [name], MD Is not a primary care provider in the [place]:  [employer]  The patient is not on the current member enrollment rosters for any of the [place] risk contracted plans with a [employer] manager in [facility]      Reason:    Patient's primary care provider is not an in-network provider at this time.  Will sign off   For additional questions or referrals please contact:    For questions, please call:  Natividad Brood, RN BSN [place]  [phone] business mobile phone Toll free office [phone]  *[place]  [phone] Fax number: [phone] [name]
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
## 2022-07-10 NOTE — Plan of Care (Signed)
  Problem: Education: Goal: Knowledge of General Education information will improve Description: Including pain rating scale, medication(s)/side effects and non-pharmacologic comfort measures Outcome: Progressing   Problem: Coping: Goal: Level of anxiety will decrease Outcome: Progressing   Problem: Coping: Goal: Level of anxiety will decrease Outcome: Progressing

## 2022-07-10 NOTE — Discharge Summary (Signed)
Physician Discharge Summary   [name] EXN:[id number] DOB: 28-Jun-1965 DOA: 07/05/2022  PCP: [name] Ebbs, MD  Admit date: 07/05/2022 Discharge date: 07/10/2022 Barriers to discharge: none  Admitted From: Home Disposition:  [place] Discharging physician: Dwyane Dee, MD  Recommendations for Outpatient Follow-up:  Continue left shoulder sling  Home Health:  Equipment/Devices:   Discharge Condition: stable CODE STATUS: Full Diet recommendation:  Diet Orders (From admission, onward)     Start     Ordered   07/10/22 0000  Diet - low sodium heart healthy        07/10/22 1124   07/08/22 1200  Diet Carb Modified Fluid consistency: Thin; Room service appropriate? Yes  Diet effective now       Question Answer Comment  Diet-HS Snack? Nothing   Calorie Level Medium 1600-2000   Fluid consistency: Thin   Room service appropriate? Yes      07/08/22 1159            Hospital Course: [name] is a 57 yo female with PMH polysubstance abuse, anxiety/depression, afib, chronic back pain, COPD, DMII, neuropathy, HTN, hypothyroidism, schizoaffective d/o, bipolar d/o, s/p gastric bypass.  Recent left shoulder nondisplaced greater tuberosity fracture with ongoing healing and sling in place for comfort. She is recommended to continue active ROM per ortho as well. She has been evaluated by psychiatry and remains under IVC and is recommended for inpatient psych placement as well.   She initially had an elevated lactic on admission which was felt due to hypoperfusion from volume depletion.  This normalized with fluid resuscitation. She was recommended for inpatient psychiatric hospitalization per psychiatry.  Assessment and Plan:  Aggressive behavior Adjustment disorder with mixed disturbance of emotion and conduct Schizoaffective disorder -Appreciate psychiatry assistance - Continue IVC - Patient recommended for inpatient psychiatric hospitalization - Continue gabapentin,  Seroquel -Patient is medically stable for discharge to [facility]   Lactic Acidosis -resolved -Low suspicion for infection on admission - Lactic considered elevated due to hypoperfusion and volume depletion - Normalized with fluid resuscitation   Left humerus fracture -X-ray shows healing left greater tuberosity fracture with callus formation -Evaluated by orthopedic surgery, appreciate assistance - Continue sling for comfort - ROM as tolerated - Outpatient follow-up with orthopedic surgery   COPD: Albuterol as needed   Tobacco abuse: Nicotine patch order   Polysubstance abuse UDS positive for cocaine.     Protein caloric malnutrition severe Started  Ensure   History of PE: No longer on anticoagulation   History of RPR positive: RPR non reactive.    Hypotension: IV bolus.  Work up for infection; Negative.  BP normalized.  UA;Negative.  Chest x ray: Lower lung volumes, no other acute cardiopulmonary abnormality.   Questionable Tylenol overdose: Ruled out  Related that she took excess amount of Tylenol. Tylenol level less than 10 x 3--negative.    Principal Diagnosis: Suicide attempt [facility])  Discharge Diagnoses: Active Hospital Problems   Diagnosis Date Noted   Suicide attempt ([place]) 07/10/2022   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    Priority: 1.   Schizoaffective disorder, bipolar type ([place]) 11/13/2017    Priority: 2.   COPD (chronic obstructive pulmonary disease) ([place]) 09/29/2011    Priority: 2.   Aggressive behavior 07/05/2022    Priority: 3.   Polysubstance abuse ([place]) 11/13/2017    Priority: 3.   Closed fracture of left proximal humerus 07/08/2022    Priority: 4.   Type 2 diabetes mellitus (  [place]) 09/06/2015    Priority: 4.   Homicidal ideation 07/07/2022   Overdose by acetaminophen 07/05/2022   Pulmonary embolism ([place]) 05/19/2022   Tobacco abuse 02/12/2022   Protein-calorie malnutrition, severe 01/14/2022    Essential hypertension 11/04/2011    Resolved Hospital Problems  No resolved problems to display.     Discharge Instructions     Diet - low sodium heart healthy   Complete by: As directed    Discharge wound care:   Complete by: As directed    Continue sling to left shoulder for comfort in setting of left humerus fracture      Allergies as of 07/10/2022       Reactions   Iron Dextran Shortness Of Breath, Anxiety   Tylenol [acetaminophen] Anaphylaxis   Per pt she had an allergic reaction to Tylenol and it was severe; she couldn't breath.    Aspirin Nausea And Vomiting, Other (See Comments)   Ok to take tylenol or ibuprofen        Medication List     STOP taking these medications    acetaminophen 325 MG tablet Commonly known as: TYLENOL   albuterol 108 (90 Base) MCG/ACT inhaler Commonly known as: ProAir HFA   Chlorhexidine Gluconate Cloth 2 % Pads   feeding supplement Liqd   lactulose 10 GM/15ML solution Commonly known as: CHRONULAC   mouth rinse Liqd solution   mupirocin ointment 2 % Commonly known as: BACTROBAN   naloxone 4 MG/0.1ML Liqd nasal spray kit Commonly known as: NARCAN   nicotine 14 mg/24hr patch Commonly known as: NICODERM CQ - dosed in mg/24 hours Replaced by: nicotine 21 mg/24hr patch   oxyCODONE 5 MG immediate release tablet Commonly known as: Oxy IR/ROXICODONE       TAKE these medications    folic acid 1 MG tablet Commonly known as: FOLVITE Take 1 tablet (1 mg total) by mouth daily.   gabapentin 300 MG capsule Commonly known as: NEURONTIN Take 1 capsule (300 mg total) by mouth 3 (three) times daily.   ibuprofen 400 MG tablet Commonly known as: ADVIL Take 1 tablet (400 mg total) by mouth every 6 (six) hours as needed for moderate pain.   multivitamin with minerals Tabs tablet Take 1 tablet by mouth daily.   nicotine 21 mg/24hr patch Commonly known as: NICODERM CQ - dosed in mg/24 hours Place 1 patch (21 mg total) onto the  skin daily. Start taking on: July 11, 2022 Replaces: nicotine 14 mg/24hr patch   QUEtiapine 200 MG 24 hr tablet Commonly known as: SEROQUEL XR Take 1 tablet (200 mg total) by mouth at bedtime.   senna-docusate 8.6-50 MG tablet Commonly known as: Senokot-S Take 1 tablet by mouth at bedtime as needed for mild constipation.   thiamine 100 MG tablet Commonly known as: Vitamin B-1 Take 1 tablet (100 mg total) by mouth daily.               Discharge Care Instructions  (From admission, onward)           Start     Ordered   07/10/22 0000  Discharge wound care:       Comments: Continue sling to left shoulder for comfort in setting of left humerus fracture   07/10/22 1124            Follow-up Information     [name] Ebbs, MD Follow up.   Specialty: Internal Medicine Contact information: [address] [place] [address] [phone]  Allergies  Allergen Reactions   Iron Dextran Shortness Of Breath and Anxiety   Tylenol [Acetaminophen] Anaphylaxis    Per pt she had an allergic reaction to Tylenol and it was severe; she couldn't breath.    Aspirin Nausea And Vomiting and Other (See Comments)    Ok to take tylenol or ibuprofen     Consultations: Psychiatry  Procedures:   Discharge Exam: BP 120/80 (BP Location: Right Arm)   Pulse 98   Temp 98 F (36.7 C) (Oral)   Resp 18   Ht '5\' 2"'$  (1.575 m)   Wt 48.4 kg   LMP 01/08/2011   SpO2 98%   BMI 19.52 kg/m  Physical Exam Constitutional:      General: She is not in acute distress.    Appearance: She is not ill-appearing.     Comments: Agitated adult woman lying in bed more calm and cooperative  HENT:     Head: Normocephalic and atraumatic.     Mouth/Throat:     Mouth: Mucous membranes are moist.  Eyes:     Extraocular Movements: Extraocular movements intact.  Cardiovascular:     Rate and Rhythm: Normal rate and regular rhythm.  Pulmonary:     Effort: Pulmonary  effort is normal.     Breath sounds: Normal breath sounds.  Abdominal:     General: Bowel sounds are normal. There is no distension.     Palpations: Abdomen is soft.     Tenderness: There is no abdominal tenderness.  Musculoskeletal:     Cervical back: Normal range of motion and neck supple.     Comments: Left upper extremity partially in shoulder sling  Neurological:     General: No focal deficit present.      The results of significant diagnostics from this hospitalization (including imaging, microbiology, ancillary and laboratory) are listed below for reference.   Microbiology: Recent Results (from the past 240 hour(s))  Resp panel by RT-PCR (RSV, Flu A&B, Covid) Anterior Nasal Swab     Status: None   Collection Time: 07/05/22  2:08 AM   Specimen: Anterior Nasal Swab  Result Value Ref Range Status   SARS Coronavirus 2 by RT PCR NEGATIVE NEGATIVE Final    Comment: (NOTE) SARS-CoV-2 target nucleic acids are NOT DETECTED.  The SARS-CoV-2 RNA is generally detectable in upper respiratory specimens during the acute phase of infection. The lowest concentration of SARS-CoV-2 viral copies this assay can detect is 138 copies/mL. A negative result does not preclude SARS-Cov-2 infection and should not be used as the sole basis for treatment or other patient management decisions. A negative result may occur with  improper specimen collection/handling, submission of specimen other than nasopharyngeal swab, presence of viral mutation(s) within the areas targeted by this assay, and inadequate number of viral copies(<138 copies/mL). A negative result must be combined with clinical observations, patient history, and epidemiological information. The expected result is Negative.  Fact Sheet for Patients:  EntrepreneurPulse.com.au  Fact Sheet for Healthcare Providers:  IncredibleEmployment.be  This test is no t yet approved or cleared by the [place]  FDA and  has been authorized for detection and/or diagnosis of SARS-CoV-2 by FDA under an Emergency Use Authorization (EUA). This EUA will remain  in effect (meaning this test can be used) for the duration of the COVID-19 declaration under Section 564(b)(1) of the Act, 21 U.S.C.section 360bbb-3(b)(1), unless the authorization is terminated  or revoked sooner.       Influenza A by PCR NEGATIVE NEGATIVE  Final   Influenza B by PCR NEGATIVE NEGATIVE Final    Comment: (NOTE) The Xpert Xpress SARS-CoV-2/FLU/RSV plus assay is intended as an aid in the diagnosis of influenza from Nasopharyngeal swab specimens and should not be used as a sole basis for treatment. Nasal washings and aspirates are unacceptable for Xpert Xpress SARS-CoV-2/FLU/RSV testing.  Fact Sheet for Patients: EntrepreneurPulse.com.au  Fact Sheet for Healthcare Providers: IncredibleEmployment.be  This test is not yet approved or cleared by the [place] FDA and has been authorized for detection and/or diagnosis of SARS-CoV-2 by FDA under an Emergency Use Authorization (EUA). This EUA will remain in effect (meaning this test can be used) for the duration of the COVID-19 declaration under Section 564(b)(1) of the Act, 21 U.S.C. section 360bbb-3(b)(1), unless the authorization is terminated or revoked.     Resp Syncytial Virus by PCR NEGATIVE NEGATIVE Final    Comment: (NOTE) Fact Sheet for Patients: EntrepreneurPulse.com.au  Fact Sheet for Healthcare Providers: IncredibleEmployment.be  This test is not yet approved or cleared by the [place] FDA and has been authorized for detection and/or diagnosis of SARS-CoV-2 by FDA under an Emergency Use Authorization (EUA). This EUA will remain in effect (meaning this test can be used) for the duration of the COVID-19 declaration under Section 564(b)(1) of the Act, 21 U.S.C. section  360bbb-3(b)(1), unless the authorization is terminated or revoked.  Performed at [facility], [place] [address], [place], [place] [address]   Urine Culture     Status: Abnormal   Collection Time: 07/05/22 10:29 AM   Specimen: Urine, Clean Catch  Result Value Ref Range Status   Specimen Description URINE, CLEAN CATCH  Final   Special Requests   Final    NONE Performed at [facility], [place] [address], [place], [place] [address]    Culture MULTIPLE SPECIES PRESENT, SUGGEST RECOLLECTION (A)  Final   Report Status 07/07/2022 FINAL  Final  Culture, blood (Routine X 2) w Reflex to ID Panel     Status: None (Preliminary result)   Collection Time: 07/07/22  8:45 AM   Specimen: BLOOD  Result Value Ref Range Status   Specimen Description BLOOD RIGHT ANTECUBITAL  Final   Special Requests IN PEDIATRIC BOTTLE Blood Culture adequate volume  Final   Culture   Final    NO GROWTH 3 DAYS Performed at [facility], [place] [address], [place], [place] [address]    Report Status PENDING  Incomplete  Culture, blood (Routine X 2) w Reflex to ID Panel     Status: None (Preliminary result)   Collection Time: 07/07/22  8:45 AM   Specimen: BLOOD LEFT HAND  Result Value Ref Range Status   Specimen Description BLOOD LEFT HAND  Final   Special Requests   Final    BOTTLES DRAWN AEROBIC AND ANAEROBIC Blood Culture results may not be optimal due to an inadequate volume of blood received in culture bottles   Culture   Final    NO GROWTH 3 DAYS Performed at [facility], [place] [address], [place],  [address]    Report Status PENDING  Incomplete  Resp panel by RT-PCR (RSV, Flu A&B, Covid) Anterior Nasal Swab     Status: None   Collection Time: 07/09/22 12:15 PM   Specimen: Anterior Nasal Swab  Result Value Ref Range Status   SARS Coronavirus 2 by RT PCR NEGATIVE NEGATIVE Final    Comment: (NOTE) SARS-CoV-2 target nucleic acids are NOT DETECTED.  The SARS-CoV-2 RNA is generally  detectable in upper respiratory specimens during the acute phase of infection. The lowest concentration of SARS-CoV-2 viral copies this assay can detect is 138 copies/mL. A negative result does not preclude SARS-Cov-2 infection and should not be used as the sole basis for treatment or other patient management decisions. A negative result may occur with  improper specimen collection/handling, submission of specimen other than nasopharyngeal swab, presence of viral mutation(s) within the areas targeted by this assay, and inadequate number of viral copies(<138 copies/mL). A negative result must be combined with clinical observations, patient history, and epidemiological information. The expected result is Negative.  Fact Sheet for Patients:  EntrepreneurPulse.com.au  Fact Sheet for Healthcare Providers:  IncredibleEmployment.be  This test is no t yet approved or cleared by the [place] FDA and  has been authorized for detection and/or diagnosis of SARS-CoV-2 by FDA under an Emergency Use Authorization (EUA). This EUA will remain  in effect (meaning this test can be used) for the duration of the COVID-19 declaration under Section 564(b)(1) of the Act, 21 U.S.C.section 360bbb-3(b)(1), unless the authorization is terminated  or revoked sooner.       Influenza A by PCR NEGATIVE NEGATIVE Final   Influenza B by PCR NEGATIVE NEGATIVE Final    Comment: (NOTE) The Xpert Xpress SARS-CoV-2/FLU/RSV plus assay is intended as an aid in the diagnosis of influenza from Nasopharyngeal swab specimens and should not be used as a sole basis for treatment. Nasal washings and aspirates are unacceptable for Xpert Xpress SARS-CoV-2/FLU/RSV testing.  Fact Sheet for Patients: EntrepreneurPulse.com.au  Fact Sheet for Healthcare Providers: IncredibleEmployment.be  This test is not yet approved or cleared by the [place] FDA  and has been authorized for detection and/or diagnosis of SARS-CoV-2 by FDA under an Emergency Use Authorization (EUA). This EUA will remain in effect (meaning this test can be used) for the duration of the COVID-19 declaration under Section 564(b)(1) of the Act, 21 U.S.C. section 360bbb-3(b)(1), unless the authorization is terminated or revoked.     Resp Syncytial Virus by PCR NEGATIVE NEGATIVE Final    Comment: (NOTE) Fact Sheet for Patients: EntrepreneurPulse.com.au  Fact Sheet for Healthcare Providers: IncredibleEmployment.be  This test is not yet approved or cleared by the [place] FDA and has been authorized for detection and/or diagnosis of SARS-CoV-2 by FDA under an Emergency Use Authorization (EUA). This EUA will remain in effect (meaning this test can be used) for the duration of the COVID-19 declaration under Section 564(b)(1) of the Act, 21 U.S.C. section 360bbb-3(b)(1), unless the authorization is terminated or revoked.  Performed at [facility], [place] [address], [place], [place] [address]      Labs: BNP (last 3 results) Recent Labs    02/05/22 0706 02/11/22 0204 02/12/22 1048  BNP 74.0 31.7 60.4   Basic Metabolic Panel: Recent Labs  Lab 07/05/22 0250 07/05/22 0523 07/06/22 0324 07/07/22 0845 07/08/22 1551  NA 136  --  141 137 133*  K 3.9  --  3.7 3.9 4.1  CL 102  --  107 110 104  CO2 19*  --  26 21* 19*  GLUCOSE 152*  --  121* 238* 134*  BUN 8  --  '8 11 14  '$ CREATININE 0.51 0.48 0.51 0.51 0.51  CALCIUM 8.7*  --  8.3* 8.2* 8.2*   Liver Function Tests: Recent Labs  Lab 07/05/22 0250 07/06/22 0324  AST 23 23  ALT 27 20  ALKPHOS 83 75  BILITOT  0.6 0.5  PROT 6.5 6.0*  ALBUMIN 3.7 3.1*   No results for input(s): "LIPASE", "AMYLASE" in the last 168 hours. No results for input(s): "AMMONIA" in the last 168 hours. CBC: Recent Labs  Lab 07/05/22 0250 07/05/22 0523 07/06/22 0324  WBC 5.1 4.3  5.3  NEUTROABS 2.2  --  2.0  HGB 12.4 12.1 12.2  HCT 37.2 36.8 39.3  MCV 92.8 92.7 95.9  PLT 269 162 232   Cardiac Enzymes: No results for input(s): "CKTOTAL", "CKMB", "CKMBINDEX", "TROPONINI" in the last 168 hours. BNP: Invalid input(s): "POCBNP" CBG: Recent Labs  Lab 07/08/22 1620 07/08/22 2052 07/09/22 1704 07/09/22 2032 07/10/22 0721  GLUCAP 120* 158* 329* 168* 138*   D-Dimer No results for input(s): "DDIMER" in the last 72 hours. Hgb A1c No results for input(s): "HGBA1C" in the last 72 hours. Lipid Profile No results for input(s): "CHOL", "HDL", "LDLCALC", "TRIG", "CHOLHDL", "LDLDIRECT" in the last 72 hours. Thyroid function studies No results for input(s): "TSH", "T4TOTAL", "T3FREE", "THYROIDAB" in the last 72 hours.  Invalid input(s): "FREET3" Anemia work up No results for input(s): "VITAMINB12", "FOLATE", "FERRITIN", "TIBC", "IRON", "RETICCTPCT" in the last 72 hours. Urinalysis    Component Value Date/Time   COLORURINE YELLOW 07/05/2022 1029   APPEARANCEUR CLEAR 07/05/2022 1029   LABSPEC 1.004 (L) 07/05/2022 1029   PHURINE 5.0 07/05/2022 1029   GLUCOSEU NEGATIVE 07/05/2022 1029   HGBUR NEGATIVE 07/05/2022 1029   BILIRUBINUR NEGATIVE 07/05/2022 1029   KETONESUR NEGATIVE 07/05/2022 1029   PROTEINUR NEGATIVE 07/05/2022 1029   UROBILINOGEN 0.2 09/04/2013 0426   NITRITE NEGATIVE 07/05/2022 1029   LEUKOCYTESUR NEGATIVE 07/05/2022 1029   Sepsis Labs Recent Labs  Lab 07/05/22 0250 07/05/22 0523 07/06/22 0324  WBC 5.1 4.3 5.3   Microbiology Recent Results (from the past 240 hour(s))  Resp panel by RT-PCR (RSV, Flu A&B, Covid) Anterior Nasal Swab     Status: None   Collection Time: 07/05/22  2:08 AM   Specimen: Anterior Nasal Swab  Result Value Ref Range Status   SARS Coronavirus 2 by RT PCR NEGATIVE NEGATIVE Final    Comment: (NOTE) SARS-CoV-2 target nucleic acids are NOT DETECTED.  The SARS-CoV-2 RNA is generally detectable in upper  respiratory specimens during the acute phase of infection. The lowest concentration of SARS-CoV-2 viral copies this assay can detect is 138 copies/mL. A negative result does not preclude SARS-Cov-2 infection and should not be used as the sole basis for treatment or other patient management decisions. A negative result may occur with  improper specimen collection/handling, submission of specimen other than nasopharyngeal swab, presence of viral mutation(s) within the areas targeted by this assay, and inadequate number of viral copies(<138 copies/mL). A negative result must be combined with clinical observations, patient history, and epidemiological information. The expected result is Negative.  Fact Sheet for Patients:  EntrepreneurPulse.com.au  Fact Sheet for Healthcare Providers:  IncredibleEmployment.be  This test is no t yet approved or cleared by the [place] FDA and  has been authorized for detection and/or diagnosis of SARS-CoV-2 by FDA under an Emergency Use Authorization (EUA). This EUA will remain  in effect (meaning this test can be used) for the duration of the COVID-19 declaration under Section 564(b)(1) of the Act, 21 U.S.C.section 360bbb-3(b)(1), unless the authorization is terminated  or revoked sooner.       Influenza A by PCR NEGATIVE NEGATIVE Final   Influenza B by PCR NEGATIVE NEGATIVE Final    Comment: (NOTE) The Xpert Xpress SARS-CoV-2/FLU/RSV plus assay  is intended as an aid in the diagnosis of influenza from Nasopharyngeal swab specimens and should not be used as a sole basis for treatment. Nasal washings and aspirates are unacceptable for Xpert Xpress SARS-CoV-2/FLU/RSV testing.  Fact Sheet for Patients: EntrepreneurPulse.com.au  Fact Sheet for Healthcare Providers: IncredibleEmployment.be  This test is not yet approved or cleared by the [place] FDA and has been  authorized for detection and/or diagnosis of SARS-CoV-2 by FDA under an Emergency Use Authorization (EUA). This EUA will remain in effect (meaning this test can be used) for the duration of the COVID-19 declaration under Section 564(b)(1) of the Act, 21 U.S.C. section 360bbb-3(b)(1), unless the authorization is terminated or revoked.     Resp Syncytial Virus by PCR NEGATIVE NEGATIVE Final    Comment: (NOTE) Fact Sheet for Patients: EntrepreneurPulse.com.au  Fact Sheet for Healthcare Providers: IncredibleEmployment.be  This test is not yet approved or cleared by the [place] FDA and has been authorized for detection and/or diagnosis of SARS-CoV-2 by FDA under an Emergency Use Authorization (EUA). This EUA will remain in effect (meaning this test can be used) for the duration of the COVID-19 declaration under Section 564(b)(1) of the Act, 21 U.S.C. section 360bbb-3(b)(1), unless the authorization is terminated or revoked.  Performed at [facility], [place] [address] [place], [place] [address]   Urine Culture     Status: Abnormal   Collection Time: 07/05/22 10:29 AM   Specimen: Urine, Clean Catch  Result Value Ref Range Status   Specimen Description URINE, CLEAN CATCH  Final   Special Requests   Final    NONE Performed at [facility], [place] [address], [place], [place] [address]    Culture MULTIPLE SPECIES PRESENT, SUGGEST RECOLLECTION (A)  Final   Report Status 07/07/2022 FINAL  Final  Culture, blood (Routine X 2) w Reflex to ID Panel     Status: None (Preliminary result)   Collection Time: 07/07/22  8:45 AM   Specimen: BLOOD  Result Value Ref Range Status   Specimen Description BLOOD RIGHT ANTECUBITAL  Final   Special Requests IN PEDIATRIC BOTTLE Blood Culture adequate volume  Final   Culture   Final    NO GROWTH 3 DAYS Performed at [facility], [place] [address] [place], [place] [address]    Report Status PENDING   Incomplete  Culture, blood (Routine X 2) w Reflex to ID Panel     Status: None (Preliminary result)   Collection Time: 07/07/22  8:45 AM   Specimen: BLOOD LEFT HAND  Result Value Ref Range Status   Specimen Description BLOOD LEFT HAND  Final   Special Requests   Final    BOTTLES DRAWN AEROBIC AND ANAEROBIC Blood Culture results may not be optimal due to an inadequate volume of blood received in culture bottles   Culture   Final    NO GROWTH 3 DAYS Performed at [facility], [place] [address], [place], [place] [address]    Report Status PENDING  Incomplete  Resp panel by RT-PCR (RSV, Flu A&B, Covid) Anterior Nasal Swab     Status: None   Collection Time: 07/09/22 12:15 PM   Specimen: Anterior Nasal Swab  Result Value Ref Range Status   SARS Coronavirus 2 by RT PCR NEGATIVE NEGATIVE Final    Comment: (NOTE) SARS-CoV-2 target nucleic acids are NOT DETECTED.  The SARS-CoV-2 RNA is generally detectable in upper respiratory specimens during the acute phase of infection. The lowest concentration  of SARS-CoV-2 viral copies this assay can detect is 138 copies/mL. A negative result does not preclude SARS-Cov-2 infection and should not be used as the sole basis for treatment or other patient management decisions. A negative result may occur with  improper specimen collection/handling, submission of specimen other than nasopharyngeal swab, presence of viral mutation(s) within the areas targeted by this assay, and inadequate number of viral copies(<138 copies/mL). A negative result must be combined with clinical observations, patient history, and epidemiological information. The expected result is Negative.  Fact Sheet for Patients:  EntrepreneurPulse.com.au  Fact Sheet for Healthcare Providers:  IncredibleEmployment.be  This test is no t yet approved or cleared by the [place] FDA and  has been authorized for detection and/or diagnosis of  SARS-CoV-2 by FDA under an Emergency Use Authorization (EUA). This EUA will remain  in effect (meaning this test can be used) for the duration of the COVID-19 declaration under Section 564(b)(1) of the Act, 21 U.S.C.section 360bbb-3(b)(1), unless the authorization is terminated  or revoked sooner.       Influenza A by PCR NEGATIVE NEGATIVE Final   Influenza B by PCR NEGATIVE NEGATIVE Final    Comment: (NOTE) The Xpert Xpress SARS-CoV-2/FLU/RSV plus assay is intended as an aid in the diagnosis of influenza from Nasopharyngeal swab specimens and should not be used as a sole basis for treatment. Nasal washings and aspirates are unacceptable for Xpert Xpress SARS-CoV-2/FLU/RSV testing.  Fact Sheet for Patients: EntrepreneurPulse.com.au  Fact Sheet for Healthcare Providers: IncredibleEmployment.be  This test is not yet approved or cleared by the [place] FDA and has been authorized for detection and/or diagnosis of SARS-CoV-2 by FDA under an Emergency Use Authorization (EUA). This EUA will remain in effect (meaning this test can be used) for the duration of the COVID-19 declaration under Section 564(b)(1) of the Act, 21 U.S.C. section 360bbb-3(b)(1), unless the authorization is terminated or revoked.     Resp Syncytial Virus by PCR NEGATIVE NEGATIVE Final    Comment: (NOTE) Fact Sheet for Patients: EntrepreneurPulse.com.au  Fact Sheet for Healthcare Providers: IncredibleEmployment.be  This test is not yet approved or cleared by the [place] FDA and has been authorized for detection and/or diagnosis of SARS-CoV-2 by FDA under an Emergency Use Authorization (EUA). This EUA will remain in effect (meaning this test can be used) for the duration of the COVID-19 declaration under Section 564(b)(1) of the Act, 21 U.S.C. section 360bbb-3(b)(1), unless the authorization is terminated  or revoked.  Performed at [facility], [place] [address], [place], [place] [address]     Procedures/Studies: DG Shoulder Left  Result Date: 07/07/2022 CLINICAL DATA:  Left shoulder pain common no known injury, initial encounter EXAM: LEFT SHOULDER - 2+ VIEW COMPARISON:  06/16/2022 FINDINGS: Mild degenerative changes of the acromioclavicular joint are seen. Humeral head is well seated. Previously seen greater tuberosity fracture is again identified with some degree of healing. No significant displacement is noted. No new fracture is seen. IMPRESSION: Healing left greater tuberosity fracture as described. Electronically Signed   By: Inez Catalina M.D.   On: 07/07/2022 19:44   DG Forearm Left  Result Date: 07/06/2022 CLINICAL DATA:  Fall in December with persistent left forearm pain EXAM: LEFT FOREARM - 2 VIEW COMPARISON:  None Available. FINDINGS: Overlying peripheral IV obscures fine bone detail. No fracture. No dislocation in the left elbow or left wrist on these views. No focal osseous erosions or periosteal reaction or lesions. IMPRESSION: No acute osseous abnormality in the  left forearm. Electronically Signed   By: Ilona Sorrel M.D.   On: 07/06/2022 16:45   DG CHEST PORT 1 VIEW  Result Date: 07/05/2022 CLINICAL DATA:  57 year old female with hypotension. EXAM: PORTABLE CHEST 1 VIEW COMPARISON:  CT Abdomen and Pelvis 06/15/2022. Portable chest 05/04/2022. FINDINGS: Portable AP semi upright view at 0815 hours. Lower lung volumes. Normal cardiac size and mediastinal contours. Faint linear perihilar atelectasis bilaterally. Otherwise Allowing for portable technique the lungs are clear. No pneumothorax or pleural effusion. Visualized tracheal air column is within normal limits. Negative visible bowel gas. No acute osseous abnormality identified. IMPRESSION: Lower lung volumes, no other acute cardiopulmonary abnormality. Electronically Signed   By: Genevie Ann M.D.   On: 07/05/2022 08:39   CT HEAD  WO CONTRAST (5MM)  Result Date: 06/16/2022 CLINICAL DATA:  Altered mental status, nontraumatic EXAM: CT HEAD WITHOUT CONTRAST TECHNIQUE: Contiguous axial images were obtained from the base of the skull through the vertex without intravenous contrast. RADIATION DOSE REDUCTION: This exam was performed according to the departmental dose-optimization program which includes automated exposure control, adjustment of the mA and/or kV according to patient size and/or use of iterative reconstruction technique. COMPARISON:  Brain MRI 01/16/2022 FINDINGS: Brain: No evidence of acute infarction, hemorrhage, hydrocephalus, extra-axial collection or mass lesion/mass effect. Vascular: No hyperdense vessel or unexpected calcification. Skull: Normal. Negative for fracture or focal lesion. Sinuses/Orbits: No acute finding. IMPRESSION: Stable, negative head CT. Electronically Signed   By: Jorje Guild M.D.   On: 06/16/2022 06:27   DG Shoulder Left Portable  Result Date: 06/16/2022 CLINICAL DATA:  Fall, left shoulder pain EXAM: LEFT SHOULDER COMPARISON:  None Available. FINDINGS: Acute minimally displaced anatomically aligned fracture of the greater tuberosity of the left humeral head again identified with interval development of minimal non bridging callus in keeping with interval incomplete healing. Normal overall alignment. Glenohumeral joint space is preserved. Acromioclavicular joint space is not well profiled. Limited evaluation of the left hemithorax is unremarkable. IMPRESSION: 1. Incomplete healing of minimally displaced, anatomically aligned greater tuberosity fracture of the left humeral head. Electronically Signed   By: Fidela Salisbury M.D.   On: 06/16/2022 01:00   CT ABDOMEN PELVIS W CONTRAST  Result Date: 06/15/2022 CLINICAL DATA:  Abdominal pain EXAM: CT ABDOMEN AND PELVIS WITH CONTRAST TECHNIQUE: Multidetector CT imaging of the abdomen and pelvis was performed using the standard protocol following bolus  administration of intravenous contrast. RADIATION DOSE REDUCTION: This exam was performed according to the departmental dose-optimization program which includes automated exposure control, adjustment of the mA and/or kV according to patient size and/or use of iterative reconstruction technique. CONTRAST:  51m OMNIPAQUE IOHEXOL 350 MG/ML SOLN COMPARISON:  05/18/2022 FINDINGS: Lower chest: No acute pleural or parenchymal lung disease. Hepatobiliary: No focal liver abnormality is seen. No gallstones, gallbladder wall thickening, or biliary dilatation. Pancreas: Unremarkable. No pancreatic ductal dilatation or surrounding inflammatory changes. Spleen: Normal in size without focal abnormality. Adrenals/Urinary Tract: Adrenal glands are unremarkable. Kidneys are normal, without renal calculi, focal lesion, or hydronephrosis. Bladder is unremarkable. 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 No bowel obstruction or ileus. There is mild mural thickening within distal small bowel in the right lower quadrant, consistent with inflammatory or infectious enteritis. Vascular/Lymphatic: Aortic atherosclerosis. No enlarged abdominal or pelvic lymph nodes. Reproductive: Status post hysterectomy. No adnexal masses. Other: No free fluid or free intraperitoneal gas. No abdominal wall hernia. The left para umbilical abdominal wall fluid collection seen previously has resolved in the interim. Musculoskeletal: No acute  or destructive bony lesions. Chronic T9 compression deformity. Reconstructed images demonstrate no additional findings. IMPRESSION: 1. Mild wall thickening of the distal small bowel in the right lower quadrant, compatible with inflammatory or infectious enteritis. No bowel obstruction or ileus. 2.  Aortic Atherosclerosis (ICD10-I70.0). Electronically Signed   By: Randa Ngo M.D.   On: 06/15/2022 23:38     Time coordinating discharge: Over 30 minutes    Dwyane Dee, MD  [facility] 07/10/2022, 11:24 AM
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
## 2022-07-10 NOTE — TOC Transition Note (Addendum)
Transition of Care [facility]) - CM/SW Discharge Note   Patient Details  Name: [name] MRN: [id number] Date of Birth: 04/09/1966  [facility]) CM/SW Contact:  Joanne Chars, LCSW Phone Number: 07/10/2022, 9:59 AM   Clinical Narrative:   Pt covid test is negative.  Pt has not been restrained or had forced meds.  CSW spoke with [facility] transportation and they will send officers to transport.     Pt discharging to [facility], accepting MD [place].  RN call report to [phone].   [facility] will provide transportation.      Final next level of care: Psychiatric Hospital Barriers to Discharge: Barriers Resolved   Patient Goals and CMS Choice      Discharge Placement                  Patient to be transferred to facility by: [facility] Name of family member notified: [name] Patient and family notified of of transfer: 07/10/22  Discharge Plan and Services Additional resources added to the After Visit Summary for                                       Social Determinants of Health (SDOH) Interventions SDOH Screenings   Food Insecurity: No Food Insecurity (05/06/2022)  Housing: Low Risk  (05/06/2022)  Transportation Needs: No Transportation Needs (05/06/2022)  Utilities: Not At Risk (05/06/2022)  Alcohol Screen: Medium Risk (08/04/2018)  Social Connections: Moderately Isolated (05/06/2022)  Tobacco Use: High Risk (07/05/2022)     Readmission Risk Interventions    04/28/2022    2:18 PM 04/07/2022   12:41 PM 07/27/2020   11:06 AM  Readmission Risk Prevention Plan  Transportation Screening Complete Complete Complete  PCP or Specialist Appt within 5-7 Days   Complete  Home Care Screening   Complete  [place] or Home Care Consult  Complete   Social Work Consult for [place] Planning/Counseling  Complete   Palliative Care Screening  Not Applicable   Medication Review Designer, fashion/clothing) Complete Complete   PCP or Specialist appointment within 3-5 days of discharge Complete    HRI or [place] Complete    SW Recovery Care/Counseling Consult Complete    Palliative Care Screening Not [place] Not Applicable

## 2022-07-11 DIAGNOSIS — E119 Type 2 diabetes mellitus without complications: Secondary | ICD-10-CM | POA: Diagnosis not present

## 2022-07-11 DIAGNOSIS — J449 Chronic obstructive pulmonary disease, unspecified: Secondary | ICD-10-CM | POA: Diagnosis not present

## 2022-07-11 DIAGNOSIS — I10 Essential (primary) hypertension: Secondary | ICD-10-CM | POA: Diagnosis not present

## 2022-07-12 LAB — CULTURE, BLOOD (ROUTINE X 2)
Culture: NO GROWTH
Culture: NO GROWTH
Special Requests: ADEQUATE

## 2022-07-13 DIAGNOSIS — J449 Chronic obstructive pulmonary disease, unspecified: Secondary | ICD-10-CM | POA: Diagnosis not present

## 2022-07-24 DIAGNOSIS — J449 Chronic obstructive pulmonary disease, unspecified: Secondary | ICD-10-CM | POA: Diagnosis not present

## 2022-07-26 DIAGNOSIS — J449 Chronic obstructive pulmonary disease, unspecified: Secondary | ICD-10-CM | POA: Diagnosis not present

## 2022-08-04 ENCOUNTER — Emergency Department (HOSPITAL_COMMUNITY)
Admission: EM | Admit: 2022-08-04 | Discharge: 2022-08-05 | Disposition: A | Discharge status: Home / Self Care | Payer: 59 | Source: Home / Self Care | Attending: Emergency Medicine | Admitting: Emergency Medicine

## 2022-08-04 ENCOUNTER — Emergency Department (HOSPITAL_COMMUNITY): Payer: 59

## 2022-08-04 ENCOUNTER — Encounter (HOSPITAL_COMMUNITY): Payer: Self-pay | Admitting: Emergency Medicine

## 2022-08-04 ENCOUNTER — Encounter (HOSPITAL_COMMUNITY): Payer: Self-pay

## 2022-08-04 ENCOUNTER — Other Ambulatory Visit: Payer: Self-pay

## 2022-08-04 ENCOUNTER — Emergency Department (HOSPITAL_COMMUNITY)
Admission: EM | Admit: 2022-08-04 | Discharge: 2022-08-04 | Disposition: A | Discharge status: Home / Self Care | Payer: 59 | Attending: Emergency Medicine | Admitting: Emergency Medicine

## 2022-08-04 DIAGNOSIS — S82302A Unspecified fracture of lower end of left tibia, initial encounter for closed fracture: Secondary | ICD-10-CM

## 2022-08-04 DIAGNOSIS — E039 Hypothyroidism, unspecified: Secondary | ICD-10-CM | POA: Insufficient documentation

## 2022-08-04 DIAGNOSIS — I10 Essential (primary) hypertension: Secondary | ICD-10-CM | POA: Insufficient documentation

## 2022-08-04 DIAGNOSIS — J441 Chronic obstructive pulmonary disease with (acute) exacerbation: Secondary | ICD-10-CM | POA: Insufficient documentation

## 2022-08-04 DIAGNOSIS — S99912A Unspecified injury of left ankle, initial encounter: Secondary | ICD-10-CM | POA: Diagnosis present

## 2022-08-04 DIAGNOSIS — R600 Localized edema: Secondary | ICD-10-CM | POA: Diagnosis not present

## 2022-08-04 DIAGNOSIS — M25562 Pain in left knee: Secondary | ICD-10-CM | POA: Diagnosis not present

## 2022-08-04 DIAGNOSIS — S40012A Contusion of left shoulder, initial encounter: Secondary | ICD-10-CM | POA: Insufficient documentation

## 2022-08-04 DIAGNOSIS — E119 Type 2 diabetes mellitus without complications: Secondary | ICD-10-CM | POA: Insufficient documentation

## 2022-08-04 DIAGNOSIS — S82832A Other fracture of upper and lower end of left fibula, initial encounter for closed fracture: Secondary | ICD-10-CM | POA: Insufficient documentation

## 2022-08-04 DIAGNOSIS — F17200 Nicotine dependence, unspecified, uncomplicated: Secondary | ICD-10-CM | POA: Insufficient documentation

## 2022-08-04 DIAGNOSIS — R0789 Other chest pain: Secondary | ICD-10-CM | POA: Insufficient documentation

## 2022-08-04 DIAGNOSIS — J9811 Atelectasis: Secondary | ICD-10-CM | POA: Diagnosis not present

## 2022-08-04 DIAGNOSIS — R062 Wheezing: Secondary | ICD-10-CM | POA: Diagnosis not present

## 2022-08-04 DIAGNOSIS — R0602 Shortness of breath: Secondary | ICD-10-CM | POA: Diagnosis not present

## 2022-08-04 DIAGNOSIS — R079 Chest pain, unspecified: Secondary | ICD-10-CM | POA: Diagnosis not present

## 2022-08-04 DIAGNOSIS — W19XXXA Unspecified fall, initial encounter: Secondary | ICD-10-CM

## 2022-08-04 DIAGNOSIS — J449 Chronic obstructive pulmonary disease, unspecified: Secondary | ICD-10-CM | POA: Insufficient documentation

## 2022-08-04 DIAGNOSIS — S42252A Displaced fracture of greater tuberosity of left humerus, initial encounter for closed fracture: Secondary | ICD-10-CM | POA: Diagnosis not present

## 2022-08-04 LAB — CBC
HCT: 42.4 % (ref 36.0–46.0)
Hemoglobin: 14.1 g/dL (ref 12.0–15.0)
MCH: 29.6 pg (ref 26.0–34.0)
MCHC: 33.3 g/dL (ref 30.0–36.0)
MCV: 88.9 fL (ref 80.0–100.0)
Platelets: 182 10*3/uL (ref 150–400)
RBC: 4.77 MIL/uL (ref 3.87–5.11)
RDW: 14.4 % (ref 11.5–15.5)
WBC: 6.6 10*3/uL (ref 4.0–10.5)
nRBC: 0 % (ref 0.0–0.2)

## 2022-08-04 LAB — I-STAT BETA HCG BLOOD, ED (MC, WL, AP ONLY): I-stat hCG, quantitative: <5 m[IU]/mL (ref <5)

## 2022-08-04 MED ORDER — IBUPROFEN 400 MG PO TABS
600.0000 mg | ORAL_TABLET | Freq: Four times a day (QID) | ORAL | Status: DC | PRN
  Administered 2022-08-04: 600 mg via ORAL
  Filled 2022-08-04: qty 1

## 2022-08-04 MED ORDER — LIDOCAINE 5 % EX PTCH
1.0000 | MEDICATED_PATCH | CUTANEOUS | Status: DC
  Administered 2022-08-04: 1 via TRANSDERMAL
  Filled 2022-08-04: qty 1

## 2022-08-04 MED ORDER — OXYCODONE HCL 5 MG PO TABS
5.0000 mg | ORAL_TABLET | Freq: Once | ORAL | Status: AC
  Administered 2022-08-04: 5 mg via ORAL
  Filled 2022-08-04: qty 1

## 2022-08-04 MED ORDER — LIDOCAINE 4 % EX CREA: 1.0000 | TOPICAL_CREAM | Freq: Four times a day (QID) | CUTANEOUS | Status: DC | PRN

## 2022-08-04 NOTE — Progress Notes (Signed)
Orthopedic Tech Progress Note Patient Details:  [name] 1966/04/12 [id number]  Once I applied the boot patient started hurting instantly. (Daughter stated what she was given for pain did not help, so they would get something better once they left the hospital) Tried reapplying it thinking it would help but nothing helped, went and told PA what was going on.   Ortho Devices Type of Ortho Device: CAM walker Ortho Device/Splint Location: LLE Ortho Device/Splint Interventions: Ordered, Application, Adjustment   Post Interventions Patient Tolerated: Fair, Poor Instructions Provided: Care of device  Janit Pagan 08/04/2022, 10:49 AM
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
## 2022-08-04 NOTE — ED Notes (Signed)
Patient transported to X-ray 

## 2022-08-04 NOTE — ED Notes (Signed)
Called ortho tech to place CAM boot

## 2022-08-04 NOTE — ED Triage Notes (Signed)
Pt from home, was seen earlier for her "foot."    Pt she c/o chest pain and sob, which gets worse w/ movement and exertion.  She is grabbing at her left shoulder area where she has re-aggravated and injury.  Does have home O2 however family states she does not use O2 like she should.    Allergic to ASA 10/10 pn 140/90 Pulse 100 91% RA Was wheezing on RA wanted and given a 10 albuterol and 0.5 of Atrovent.  CBG 278

## 2022-08-04 NOTE — ED Provider Notes (Signed)
[facility] Provider Note   CSN: [id number] Arrival date & time: 08/04/22  2253     History {Add pertinent medical, surgical, social history, OB history to HPI:1} Chief Complaint  Patient presents with   Shortness of Breath   Chest Pain    [name] is a 57 y.o. female.  HPI     This is a 57 year old female who presents with reported chest pain or shortness of breath.  Patient reports that she went to have a bowel movement and subsequently developed acute onset of chest pain and shortness of breath.  She reports the chest pain is sharp and worse with movement and exertion.  Denies recent fevers or cough.  She is a smoker.  She does not wear oxygen at home but states "I have it and I am supposed to wear it."  Seen earlier today for her leg after fall.  Found to have a fibula fracture.  Also has a known prior shoulder injury.  Of note, patient has had significant recent medical history and admission for psychosis/schizoaffective disorder.   Home Medications Prior to Admission medications   Medication Sig Start Date End Date Taking? Authorizing Provider  gabapentin (NEURONTIN) 300 MG capsule Take 1 capsule (300 mg total) by mouth 3 (three) times daily. 07/10/22   Dwyane Dee, MD  ibuprofen (ADVIL) 400 MG tablet Take 1 tablet (400 mg total) by mouth every 6 (six) hours as needed for moderate pain. 07/10/22   Dwyane Dee, MD  Multiple Vitamin (MULTIVITAMIN WITH MINERALS) TABS tablet Take 1 tablet by mouth daily. Patient not taking: Reported on 07/05/2022 06/21/22 07/21/22  Dana Allan I, MD  nicotine (NICODERM CQ - DOSED IN MG/24 HOURS) 21 mg/24hr patch Place 1 patch (21 mg total) onto the skin daily. 07/11/22   Dwyane Dee, MD  QUEtiapine (SEROQUEL XR) 200 MG 24 hr tablet Take 1 tablet (200 mg total) by mouth at bedtime. 07/10/22   Dwyane Dee, MD  senna-docusate (SENOKOT-S) 8.6-50 MG tablet Take 1 tablet by mouth at bedtime  as needed for mild constipation. 07/10/22   Dwyane Dee, MD      Allergies    Iron dextran, Tylenol [acetaminophen], and Aspirin    Review of Systems   Review of Systems  Constitutional:  Negative for fever.  Respiratory:  Positive for shortness of breath. Negative for cough.   Cardiovascular:  Positive for chest pain.  Gastrointestinal:  Negative for abdominal pain, nausea and vomiting.  All other systems reviewed and are negative.   Physical Exam Updated Vital Signs BP 129/73 (BP Location: Right Arm)   Pulse 94   Temp 98.3 F (36.8 C) (Oral)   Resp 19   Ht 1.575 m ('5\' 2"'$ )   LMP 01/08/2011   SpO2 94%   BMI 19.52 kg/m  Physical Exam Vitals and nursing note reviewed.  Constitutional:      Comments: Disheveled appearing, nontoxic  HENT:     Head: Normocephalic and atraumatic.  Eyes:     Pupils: Pupils are equal, round, and reactive to light.  Cardiovascular:     Rate and Rhythm: Normal rate and regular rhythm.     Heart sounds: Normal heart sounds.  Pulmonary:     Effort: Pulmonary effort is normal. No respiratory distress.     Breath sounds: Wheezing present.  Abdominal:     General: Bowel sounds are normal.     Palpations: Abdomen is soft.     Comments: Extensive abdominal  scarring, slight tenderness to palpation, normal bowel sounds, no significant distention  Musculoskeletal:     Cervical back: Neck supple.  Skin:    General: Skin is warm and dry.  Neurological:     Mental Status: She is alert and oriented to person, place, and time.  Psychiatric:        Mood and Affect: Mood is anxious.     ED Results / Procedures / Treatments   Labs (all labs ordered are listed, but only abnormal results are displayed) Labs Reviewed  BASIC METABOLIC PANEL  CBC  COMPREHENSIVE METABOLIC PANEL  LIPASE, BLOOD  D-DIMER, QUANTITATIVE  ETHANOL  I-STAT BETA HCG BLOOD, ED ([facility], [facility], [facility] ONLY)  TROPONIN I (HIGH SENSITIVITY)    EKG None  Radiology DG Chest 2  View  Result Date: 08/04/2022 CLINICAL DATA:  Chest pain and shortness of breath. EXAM: CHEST - 2 VIEW COMPARISON:  07/05/2022. FINDINGS: The heart size and mediastinal contours are within normal limits. There is mild elevation of the left diaphragm with atelectasis at the left lung base. No effusion or pneumothorax. No acute osseous abnormality. IMPRESSION: No active cardiopulmonary disease. Electronically Signed   By: Brett Fairy M.D.   On: 08/04/2022 23:22   DG Ankle Complete Left  Result Date: 08/04/2022 CLINICAL DATA:  Status post fall. EXAM: LEFT ANKLE COMPLETE - 3+ VIEW COMPARISON:  None Available. FINDINGS: Obliquely oriented fracture deformity involves the distal fibula. There is mild posterior displacement of the distal fracture fragments. The medial malleolus appears intact. Lateral soft tissue edema. Small plantar and posterior calcaneal heel spurs. Vascular calcifications. IMPRESSION: Acute fracture of the distal fibula with overlying soft tissue edema. Heel spurs. Electronically Signed   By: Kerby Moors M.D.   On: 08/04/2022 09:44   DG Shoulder Left  Result Date: 08/04/2022 CLINICAL DATA:  Wall EXAM: LEFT SHOULDER - 3 VIEW COMPARISON:  Left shoulder radiograph dated July 07, 2022 FINDINGS: Subacute fracture of the greater tuberosity. No evidence of acute fracture. Mild degenerative changes of the acromioclavicular joint. Soft tissues are unremarkable. IMPRESSION: Subacute fracture of the greater tuberosity. No evidence of acute fracture. Electronically Signed   By: Yetta Glassman M.D.   On: 08/04/2022 09:41   DG Knee AP/LAT W/Sunrise Left  Result Date: 08/04/2022 CLINICAL DATA:  Acute left knee pain status post fall. EXAM: LEFT KNEE 3 VIEWS COMPARISON:  None Available. FINDINGS: There is a small suprapatellar joint effusion. No signs of acute fracture or dislocation. Mild tricompartment osteoarthritis identified. Soft tissues are unremarkable. IMPRESSION: 1. Small suprapatellar  joint effusion. 2. Mild tricompartment osteoarthritis. Electronically Signed   By: Kerby Moors M.D.   On: 08/04/2022 09:41    Procedures Procedures  {Document cardiac monitor, telemetry assessment procedure when appropriate:1}  Medications Ordered in ED Medications - No data to display  ED Course/ Medical Decision Making/ A&P   {   Click here for ABCD2, HEART and other calculatorsREFRESH Note before signing :1}                          Medical Decision Making Amount and/or Complexity of Data Reviewed Labs: ordered. Radiology: ordered.   ***  {Document critical care time when appropriate:1} {Document review of labs and clinical decision tools ie heart score, Chads2Vasc2 etc:1}  {Document your independent review of radiology images, and any outside records:1} {Document your discussion with family members, caretakers, and with consultants:1} {Document social determinants of health affecting pt's care:1} {Document your decision  making why or why not admission, treatments were needed:1} Final Clinical Impression(s) / ED Diagnoses Final diagnoses:  None    Rx / DC Orders ED Discharge Orders     None
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
## 2022-08-04 NOTE — Discharge Instructions (Addendum)
Today you were evaluated for left shoulder, knee, and ankle pain after falling the night before. X-rays show that you have an ankle fracture, specifically of the distal fibula. The good news is that this fracture does really well without surgery.  The x-rays of your shoulder and knee look fine, there are no fractures there. Wear your orthopedic boot all day and any time you walk We have given you crutches to use if desired, however these are not necessary for the type of fracture you have.  Please follow-up with orthopedist Dr. Griffin Basil.  His information is included in this packet.  Please call for an appointment to follow-up.  For pain relief, you can use ibuprofen, icing the area for 10 to 15 minutes 3 times daily, and elevation.  Your chart indicates that you are allergic to both aspirin and Tylenol, so do not take these.

## 2022-08-05 DIAGNOSIS — S82832A Other fracture of upper and lower end of left fibula, initial encounter for closed fracture: Secondary | ICD-10-CM | POA: Diagnosis not present

## 2022-08-05 LAB — COMPREHENSIVE METABOLIC PANEL
ALT: 17 U/L (ref 0–44)
AST: 22 U/L (ref 15–41)
Albumin: 3.4 g/dL — ABNORMAL LOW (ref 3.5–5.0)
Alkaline Phosphatase: 90 U/L (ref 38–126)
Anion gap: 9 (ref 5–15)
BUN: 6 mg/dL (ref 6–20)
CO2: 24 mmol/L (ref 22–32)
Calcium: 8.4 mg/dL — ABNORMAL LOW (ref 8.9–10.3)
Chloride: 105 mmol/L (ref 98–111)
Creatinine, Ser: 0.49 mg/dL (ref 0.44–1.00)
GFR, Estimated: >60 mL/min (ref >60)
Glucose, Bld: 203 mg/dL — ABNORMAL HIGH (ref 70–99)
Potassium: 3.6 mmol/L (ref 3.5–5.1)
Sodium: 138 mmol/L (ref 135–145)
Total Bilirubin: 0.5 mg/dL (ref 0.3–1.2)
Total Protein: 6.7 g/dL (ref 6.5–8.1)

## 2022-08-05 LAB — BASIC METABOLIC PANEL
Anion gap: 9 (ref 5–15)
BUN: <5 mg/dL — ABNORMAL LOW (ref 6–20)
CO2: 26 mmol/L (ref 22–32)
Calcium: 8.7 mg/dL — ABNORMAL LOW (ref 8.9–10.3)
Chloride: 103 mmol/L (ref 98–111)
Creatinine, Ser: 0.53 mg/dL (ref 0.44–1.00)
GFR, Estimated: >60 mL/min (ref >60)
Glucose, Bld: 188 mg/dL — ABNORMAL HIGH (ref 70–99)
Potassium: 3.7 mmol/L (ref 3.5–5.1)
Sodium: 138 mmol/L (ref 135–145)

## 2022-08-05 LAB — TROPONIN I (HIGH SENSITIVITY)
Troponin I (High Sensitivity): 2 ng/L (ref <18)
Troponin I (High Sensitivity): <2 ng/L (ref <18)

## 2022-08-05 LAB — ETHANOL: Alcohol, Ethyl (B): <10 mg/dL (ref <10)

## 2022-08-05 LAB — D-DIMER, QUANTITATIVE: D-Dimer, Quant: 0.54 ug/mL-FEU — ABNORMAL HIGH (ref 0.00–0.50)

## 2022-08-05 LAB — LIPASE, BLOOD: Lipase: 38 U/L (ref 11–51)

## 2022-08-05 MED ORDER — ALBUTEROL SULFATE HFA 108 (90 BASE) MCG/ACT IN AERS: 2.0000 | INHALATION_SPRAY | Freq: Once | RESPIRATORY_TRACT | Status: DC

## 2022-08-05 MED ORDER — IPRATROPIUM-ALBUTEROL 0.5-2.5 (3) MG/3ML IN SOLN
3.0000 mL | Freq: Once | RESPIRATORY_TRACT | Status: AC
  Administered 2022-08-05: 3 mL via RESPIRATORY_TRACT
  Filled 2022-08-05: qty 3

## 2022-08-05 MED ORDER — DEXAMETHASONE SODIUM PHOSPHATE 10 MG/ML IJ SOLN
10.0000 mg | Freq: Once | INTRAMUSCULAR | Status: AC
  Administered 2022-08-05: 10 mg via INTRAVENOUS
  Filled 2022-08-05: qty 1

## 2022-08-05 MED ORDER — TRAMADOL HCL 50 MG PO TABS
50.0000 mg | ORAL_TABLET | Freq: Once | ORAL | Status: AC
  Administered 2022-08-05: 50 mg via ORAL
  Filled 2022-08-05: qty 1

## 2022-08-05 NOTE — Discharge Instructions (Addendum)
You were seen today for chest pain and shortness of breath.  Your workup today is reassuring.  Take over-the-counter pain medications as needed.  Make sure that you are using your inhaler at home.  Follow-up with your primary physician.

## 2022-08-13 DIAGNOSIS — J449 Chronic obstructive pulmonary disease, unspecified: Secondary | ICD-10-CM | POA: Diagnosis not present

## 2022-08-17 ENCOUNTER — Encounter (HOSPITAL_COMMUNITY): Payer: Self-pay

## 2022-08-17 ENCOUNTER — Other Ambulatory Visit: Payer: Self-pay

## 2022-08-17 ENCOUNTER — Emergency Department (HOSPITAL_COMMUNITY)
Admission: EM | Admit: 2022-08-17 | Discharge: 2022-08-17 | Disposition: A | Discharge status: Home / Self Care | Payer: 59 | Attending: Emergency Medicine | Admitting: Emergency Medicine

## 2022-08-17 ENCOUNTER — Emergency Department (HOSPITAL_COMMUNITY): Payer: 59

## 2022-08-17 DIAGNOSIS — J449 Chronic obstructive pulmonary disease, unspecified: Secondary | ICD-10-CM | POA: Diagnosis not present

## 2022-08-17 DIAGNOSIS — E119 Type 2 diabetes mellitus without complications: Secondary | ICD-10-CM | POA: Insufficient documentation

## 2022-08-17 DIAGNOSIS — E039 Hypothyroidism, unspecified: Secondary | ICD-10-CM | POA: Insufficient documentation

## 2022-08-17 DIAGNOSIS — R109 Unspecified abdominal pain: Secondary | ICD-10-CM | POA: Diagnosis not present

## 2022-08-17 DIAGNOSIS — I10 Essential (primary) hypertension: Secondary | ICD-10-CM | POA: Insufficient documentation

## 2022-08-17 DIAGNOSIS — R739 Hyperglycemia, unspecified: Secondary | ICD-10-CM | POA: Diagnosis not present

## 2022-08-17 DIAGNOSIS — R079 Chest pain, unspecified: Secondary | ICD-10-CM | POA: Diagnosis not present

## 2022-08-17 DIAGNOSIS — M79603 Pain in arm, unspecified: Secondary | ICD-10-CM | POA: Diagnosis not present

## 2022-08-17 DIAGNOSIS — M79606 Pain in leg, unspecified: Secondary | ICD-10-CM | POA: Diagnosis not present

## 2022-08-17 LAB — COMPREHENSIVE METABOLIC PANEL
ALT: 22 U/L (ref 0–44)
AST: 28 U/L (ref 15–41)
Albumin: 3.4 g/dL — ABNORMAL LOW (ref 3.5–5.0)
Alkaline Phosphatase: 117 U/L (ref 38–126)
Anion gap: 11 (ref 5–15)
BUN: 5 mg/dL — ABNORMAL LOW (ref 6–20)
CO2: 27 mmol/L (ref 22–32)
Calcium: 8.7 mg/dL — ABNORMAL LOW (ref 8.9–10.3)
Chloride: 100 mmol/L (ref 98–111)
Creatinine, Ser: 0.53 mg/dL (ref 0.44–1.00)
GFR, Estimated: >60 mL/min (ref >60)
Glucose, Bld: 186 mg/dL — ABNORMAL HIGH (ref 70–99)
Potassium: 4.2 mmol/L (ref 3.5–5.1)
Sodium: 138 mmol/L (ref 135–145)
Total Bilirubin: 1 mg/dL (ref 0.3–1.2)
Total Protein: 6.7 g/dL (ref 6.5–8.1)

## 2022-08-17 LAB — URINALYSIS, ROUTINE W REFLEX MICROSCOPIC
Bilirubin Urine: NEGATIVE
Glucose, UA: 50 mg/dL — AB
Hgb urine dipstick: NEGATIVE
Ketones, ur: NEGATIVE mg/dL
Leukocytes,Ua: NEGATIVE
Nitrite: NEGATIVE
Protein, ur: NEGATIVE mg/dL
Specific Gravity, Urine: 1.009 (ref 1.005–1.030)
pH: 6 (ref 5.0–8.0)

## 2022-08-17 LAB — CBC WITH DIFFERENTIAL/PLATELET
Abs Immature Granulocytes: 0.02 10*3/uL (ref 0.00–0.07)
Basophils Absolute: 0 10*3/uL (ref 0.0–0.1)
Basophils Relative: 0 %
Eosinophils Absolute: 0.1 10*3/uL (ref 0.0–0.5)
Eosinophils Relative: 1 %
HCT: 37.4 % (ref 36.0–46.0)
Hemoglobin: 12.6 g/dL (ref 12.0–15.0)
Immature Granulocytes: 0 %
Lymphocytes Relative: 17 %
Lymphs Abs: 1.4 10*3/uL (ref 0.7–4.0)
MCH: 28.8 pg (ref 26.0–34.0)
MCHC: 33.7 g/dL (ref 30.0–36.0)
MCV: 85.6 fL (ref 80.0–100.0)
Monocytes Absolute: 0.5 10*3/uL (ref 0.1–1.0)
Monocytes Relative: 7 %
Neutro Abs: 6.2 10*3/uL (ref 1.7–7.7)
Neutrophils Relative %: 75 %
Platelets: 311 10*3/uL (ref 150–400)
RBC: 4.37 MIL/uL (ref 3.87–5.11)
RDW: 14.9 % (ref 11.5–15.5)
WBC: 8.2 10*3/uL (ref 4.0–10.5)
nRBC: 0 % (ref 0.0–0.2)

## 2022-08-17 MED ORDER — MORPHINE SULFATE (PF) 4 MG/ML IV SOLN
4.0000 mg | Freq: Once | INTRAVENOUS | Status: AC
  Administered 2022-08-17: 4 mg via INTRAVENOUS
  Filled 2022-08-17: qty 1

## 2022-08-17 MED ORDER — METHOCARBAMOL 500 MG PO TABS: 500.0000 mg | ORAL_TABLET | Freq: Two times a day (BID) | ORAL | 0 refills | Status: DC

## 2022-08-17 MED ORDER — LORAZEPAM 2 MG/ML IJ SOLN
1.0000 mg | Freq: Once | INTRAMUSCULAR | Status: AC
  Administered 2022-08-17: 1 mg via INTRAVENOUS
  Filled 2022-08-17: qty 1

## 2022-08-17 MED ORDER — MORPHINE SULFATE (PF) 4 MG/ML IV SOLN
4.0000 mg | Freq: Once | INTRAVENOUS | Status: AC
  Administered 2022-08-17: 4 mg via INTRAMUSCULAR
  Filled 2022-08-17: qty 1

## 2022-08-17 MED ORDER — SODIUM CHLORIDE 0.9 % IV BOLUS
1000.0000 mL | Freq: Once | INTRAVENOUS | Status: AC
  Administered 2022-08-17: 1000 mL via INTRAVENOUS

## 2022-08-17 MED ORDER — IOHEXOL 350 MG/ML SOLN
75.0000 mL | Freq: Once | INTRAVENOUS | Status: AC | PRN
  Administered 2022-08-17: 75 mL via INTRAVENOUS

## 2022-08-17 MED ORDER — ONDANSETRON HCL 4 MG/2ML IJ SOLN
4.0000 mg | Freq: Once | INTRAMUSCULAR | Status: AC
  Administered 2022-08-17: 4 mg via INTRAVENOUS
  Filled 2022-08-17: qty 2
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
## 2022-08-18 ENCOUNTER — Encounter (HOSPITAL_COMMUNITY): Payer: Self-pay | Admitting: Emergency Medicine

## 2022-08-18 ENCOUNTER — Other Ambulatory Visit: Payer: Self-pay

## 2022-08-18 ENCOUNTER — Emergency Department (HOSPITAL_COMMUNITY)
Admission: EM | Admit: 2022-08-18 | Discharge: 2022-08-18 | Disposition: A | Discharge status: Home / Self Care | Payer: 59 | Attending: Emergency Medicine | Admitting: Emergency Medicine

## 2022-08-18 ENCOUNTER — Emergency Department (HOSPITAL_COMMUNITY): Payer: 59

## 2022-08-18 DIAGNOSIS — S0003XA Contusion of scalp, initial encounter: Secondary | ICD-10-CM | POA: Diagnosis not present

## 2022-08-18 DIAGNOSIS — R519 Headache, unspecified: Secondary | ICD-10-CM

## 2022-08-18 DIAGNOSIS — R0789 Other chest pain: Secondary | ICD-10-CM | POA: Diagnosis not present

## 2022-08-18 DIAGNOSIS — R739 Hyperglycemia, unspecified: Secondary | ICD-10-CM | POA: Diagnosis not present

## 2022-08-18 DIAGNOSIS — R000 Tachycardia, unspecified: Secondary | ICD-10-CM | POA: Diagnosis not present

## 2022-08-18 DIAGNOSIS — R079 Chest pain, unspecified: Secondary | ICD-10-CM | POA: Insufficient documentation

## 2022-08-18 LAB — CBC
HCT: 35.7 % — ABNORMAL LOW (ref 36.0–46.0)
Hemoglobin: 11.6 g/dL — ABNORMAL LOW (ref 12.0–15.0)
MCH: 28.6 pg (ref 26.0–34.0)
MCHC: 32.5 g/dL (ref 30.0–36.0)
MCV: 87.9 fL (ref 80.0–100.0)
Platelets: 298 10*3/uL (ref 150–400)
RBC: 4.06 MIL/uL (ref 3.87–5.11)
RDW: 15 % (ref 11.5–15.5)
WBC: 7 10*3/uL (ref 4.0–10.5)
nRBC: 0 % (ref 0.0–0.2)

## 2022-08-18 LAB — BASIC METABOLIC PANEL
Anion gap: 10 (ref 5–15)
BUN: 5 mg/dL — ABNORMAL LOW (ref 6–20)
CO2: 28 mmol/L (ref 22–32)
Calcium: 8.6 mg/dL — ABNORMAL LOW (ref 8.9–10.3)
Chloride: 98 mmol/L (ref 98–111)
Creatinine, Ser: 0.52 mg/dL (ref 0.44–1.00)
GFR, Estimated: >60 mL/min (ref >60)
Glucose, Bld: 160 mg/dL — ABNORMAL HIGH (ref 70–99)
Potassium: 4.5 mmol/L (ref 3.5–5.1)
Sodium: 136 mmol/L (ref 135–145)

## 2022-08-18 LAB — TROPONIN I (HIGH SENSITIVITY): Troponin I (High Sensitivity): 3 ng/L (ref <18)

## 2022-08-18 MED ORDER — IBUPROFEN 800 MG PO TABS
800.0000 mg | ORAL_TABLET | Freq: Once | ORAL | Status: AC
  Administered 2022-08-18: 800 mg via ORAL
  Filled 2022-08-18: qty 1

## 2022-08-18 MED ORDER — OXYCODONE HCL 5 MG PO TABS
10.0000 mg | ORAL_TABLET | Freq: Once | ORAL | Status: AC
  Administered 2022-08-18: 10 mg via ORAL
  Filled 2022-08-18: qty 2
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
## 2022-08-22 ENCOUNTER — Telehealth: Payer: Self-pay

## 2022-08-22 NOTE — Telephone Encounter (Signed)
        Patient  visited [facility] on 2/26     Telephone encounter attempt :  1st  A HIPAA compliant voice message was left requesting a return call.  Instructed patient to call back .   Rani Sisney [facility], [place] [phone] [address], [place], [place] [address] Phone: [phone] Email: [email or web address]        

## 2022-08-23 ENCOUNTER — Inpatient Hospital Stay (HOSPITAL_COMMUNITY)
Admission: EM | Admit: 2022-08-23 | Discharge: 2022-08-25 | DRG: 896 | Disposition: A | Discharge status: Home / Self Care | Payer: 59 | Attending: Family Medicine | Admitting: Family Medicine

## 2022-08-23 ENCOUNTER — Emergency Department (HOSPITAL_COMMUNITY): Payer: 59

## 2022-08-23 ENCOUNTER — Encounter (HOSPITAL_COMMUNITY): Payer: Self-pay

## 2022-08-23 ENCOUNTER — Other Ambulatory Visit: Payer: Self-pay

## 2022-08-23 DIAGNOSIS — Z825 Family history of asthma and other chronic lower respiratory diseases: Secondary | ICD-10-CM | POA: Diagnosis not present

## 2022-08-23 DIAGNOSIS — R4689 Other symptoms and signs involving appearance and behavior: Secondary | ICD-10-CM | POA: Diagnosis present

## 2022-08-23 DIAGNOSIS — R400 Somnolence: Secondary | ICD-10-CM

## 2022-08-23 DIAGNOSIS — G43909 Migraine, unspecified, not intractable, without status migrainosus: Secondary | ICD-10-CM | POA: Diagnosis present

## 2022-08-23 DIAGNOSIS — J439 Emphysema, unspecified: Secondary | ICD-10-CM | POA: Diagnosis not present

## 2022-08-23 DIAGNOSIS — F250 Schizoaffective disorder, bipolar type: Secondary | ICD-10-CM | POA: Diagnosis present

## 2022-08-23 DIAGNOSIS — I4891 Unspecified atrial fibrillation: Secondary | ICD-10-CM | POA: Diagnosis not present

## 2022-08-23 DIAGNOSIS — F14188 Cocaine abuse with other cocaine-induced disorder: Principal | ICD-10-CM | POA: Diagnosis present

## 2022-08-23 DIAGNOSIS — E119 Type 2 diabetes mellitus without complications: Secondary | ICD-10-CM | POA: Diagnosis not present

## 2022-08-23 DIAGNOSIS — F10139 Alcohol abuse with withdrawal, unspecified: Secondary | ICD-10-CM | POA: Diagnosis not present

## 2022-08-23 DIAGNOSIS — M549 Dorsalgia, unspecified: Secondary | ICD-10-CM | POA: Diagnosis not present

## 2022-08-23 DIAGNOSIS — E039 Hypothyroidism, unspecified: Secondary | ICD-10-CM | POA: Diagnosis present

## 2022-08-23 DIAGNOSIS — F19188 Other psychoactive substance abuse with other psychoactive substance-induced disorder: Secondary | ICD-10-CM | POA: Diagnosis present

## 2022-08-23 DIAGNOSIS — Z1152 Encounter for screening for COVID-19: Secondary | ICD-10-CM

## 2022-08-23 DIAGNOSIS — E1140 Type 2 diabetes mellitus with diabetic neuropathy, unspecified: Secondary | ICD-10-CM | POA: Diagnosis present

## 2022-08-23 DIAGNOSIS — F1410 Cocaine abuse, uncomplicated: Secondary | ICD-10-CM | POA: Diagnosis not present

## 2022-08-23 DIAGNOSIS — Z888 Allergy status to other drugs, medicaments and biological substances status: Secondary | ICD-10-CM

## 2022-08-23 DIAGNOSIS — Z886 Allergy status to analgesic agent status: Secondary | ICD-10-CM

## 2022-08-23 DIAGNOSIS — Z833 Family history of diabetes mellitus: Secondary | ICD-10-CM

## 2022-08-23 DIAGNOSIS — F1510 Other stimulant abuse, uncomplicated: Secondary | ICD-10-CM | POA: Diagnosis present

## 2022-08-23 DIAGNOSIS — F259 Schizoaffective disorder, unspecified: Secondary | ICD-10-CM | POA: Diagnosis present

## 2022-08-23 DIAGNOSIS — I700 Atherosclerosis of aorta: Secondary | ICD-10-CM | POA: Diagnosis not present

## 2022-08-23 DIAGNOSIS — S32019A Unspecified fracture of first lumbar vertebra, initial encounter for closed fracture: Secondary | ICD-10-CM | POA: Diagnosis present

## 2022-08-23 DIAGNOSIS — Z8249 Family history of ischemic heart disease and other diseases of the circulatory system: Secondary | ICD-10-CM

## 2022-08-23 DIAGNOSIS — S42032A Displaced fracture of lateral end of left clavicle, initial encounter for closed fracture: Secondary | ICD-10-CM | POA: Diagnosis present

## 2022-08-23 DIAGNOSIS — G8929 Other chronic pain: Secondary | ICD-10-CM | POA: Diagnosis present

## 2022-08-23 DIAGNOSIS — T17320A Food in larynx causing asphyxiation, initial encounter: Secondary | ICD-10-CM | POA: Diagnosis not present

## 2022-08-23 DIAGNOSIS — Z91199 Patient's noncompliance with other medical treatment and regimen due to unspecified reason: Secondary | ICD-10-CM

## 2022-08-23 DIAGNOSIS — F419 Anxiety disorder, unspecified: Secondary | ICD-10-CM | POA: Diagnosis present

## 2022-08-23 DIAGNOSIS — F1910 Other psychoactive substance abuse, uncomplicated: Secondary | ICD-10-CM | POA: Diagnosis not present

## 2022-08-23 DIAGNOSIS — F11188 Opioid abuse with other opioid-induced disorder: Secondary | ICD-10-CM | POA: Diagnosis present

## 2022-08-23 DIAGNOSIS — R569 Unspecified convulsions: Secondary | ICD-10-CM | POA: Diagnosis not present

## 2022-08-23 DIAGNOSIS — G9341 Metabolic encephalopathy: Secondary | ICD-10-CM | POA: Diagnosis present

## 2022-08-23 DIAGNOSIS — G40109 Localization-related (focal) (partial) symptomatic epilepsy and epileptic syndromes with simple partial seizures, not intractable, without status epilepticus: Secondary | ICD-10-CM | POA: Diagnosis present

## 2022-08-23 DIAGNOSIS — E785 Hyperlipidemia, unspecified: Secondary | ICD-10-CM | POA: Diagnosis not present

## 2022-08-23 DIAGNOSIS — G4089 Other seizures: Secondary | ICD-10-CM | POA: Diagnosis present

## 2022-08-23 DIAGNOSIS — Z91148 Patient's other noncompliance with medication regimen for other reason: Secondary | ICD-10-CM

## 2022-08-23 DIAGNOSIS — I10 Essential (primary) hypertension: Secondary | ICD-10-CM | POA: Diagnosis present

## 2022-08-23 DIAGNOSIS — S0990XA Unspecified injury of head, initial encounter: Secondary | ICD-10-CM | POA: Diagnosis not present

## 2022-08-23 DIAGNOSIS — F17210 Nicotine dependence, cigarettes, uncomplicated: Secondary | ICD-10-CM | POA: Diagnosis present

## 2022-08-23 DIAGNOSIS — T1490XA Injury, unspecified, initial encounter: Secondary | ICD-10-CM | POA: Diagnosis not present

## 2022-08-23 DIAGNOSIS — F10939 Alcohol use, unspecified with withdrawal, unspecified: Secondary | ICD-10-CM | POA: Insufficient documentation

## 2022-08-23 DIAGNOSIS — K219 Gastro-esophageal reflux disease without esophagitis: Secondary | ICD-10-CM | POA: Diagnosis present

## 2022-08-23 LAB — COMPREHENSIVE METABOLIC PANEL
ALT: 17 U/L (ref 0–44)
AST: 33 U/L (ref 15–41)
Albumin: 3.7 g/dL (ref 3.5–5.0)
Alkaline Phosphatase: 130 U/L — ABNORMAL HIGH (ref 38–126)
Anion gap: 16 — ABNORMAL HIGH (ref 5–15)
BUN: 7 mg/dL (ref 6–20)
CO2: 21 mmol/L — ABNORMAL LOW (ref 22–32)
Calcium: 9.3 mg/dL (ref 8.9–10.3)
Chloride: 97 mmol/L — ABNORMAL LOW (ref 98–111)
Creatinine, Ser: 0.62 mg/dL (ref 0.44–1.00)
GFR, Estimated: >60 mL/min (ref >60)
Glucose, Bld: 196 mg/dL — ABNORMAL HIGH (ref 70–99)
Potassium: 5.1 mmol/L (ref 3.5–5.1)
Sodium: 134 mmol/L — ABNORMAL LOW (ref 135–145)
Total Bilirubin: 2.2 mg/dL — ABNORMAL HIGH (ref 0.3–1.2)
Total Protein: 7.7 g/dL (ref 6.5–8.1)

## 2022-08-23 LAB — CBC WITH DIFFERENTIAL/PLATELET
Abs Immature Granulocytes: 0.02 10*3/uL (ref 0.00–0.07)
Basophils Absolute: 0 10*3/uL (ref 0.0–0.1)
Basophils Relative: 0 %
Eosinophils Absolute: 0 10*3/uL (ref 0.0–0.5)
Eosinophils Relative: 0 %
HCT: 41.1 % (ref 36.0–46.0)
Hemoglobin: 13.7 g/dL (ref 12.0–15.0)
Immature Granulocytes: 0 %
Lymphocytes Relative: 21 %
Lymphs Abs: 1.3 10*3/uL (ref 0.7–4.0)
MCH: 29 pg (ref 26.0–34.0)
MCHC: 33.3 g/dL (ref 30.0–36.0)
MCV: 86.9 fL (ref 80.0–100.0)
Monocytes Absolute: 0.4 10*3/uL (ref 0.1–1.0)
Monocytes Relative: 6 %
Neutro Abs: 4.5 10*3/uL (ref 1.7–7.7)
Neutrophils Relative %: 73 %
Platelets: 296 10*3/uL (ref 150–400)
RBC: 4.73 MIL/uL (ref 3.87–5.11)
RDW: 15.3 % (ref 11.5–15.5)
WBC: 6.2 10*3/uL (ref 4.0–10.5)
nRBC: 0 % (ref 0.0–0.2)

## 2022-08-23 LAB — RAPID URINE DRUG SCREEN, HOSP PERFORMED
Amphetamines: NOT DETECTED
Barbiturates: NOT DETECTED
Benzodiazepines: POSITIVE — AB
Cocaine: POSITIVE — AB
Opiates: POSITIVE — AB
Tetrahydrocannabinol: NOT DETECTED

## 2022-08-23 LAB — ETHANOL: Alcohol, Ethyl (B): <10 mg/dL (ref <10)

## 2022-08-23 LAB — I-STAT CHEM 8, ED
BUN: 7 mg/dL (ref 6–20)
Calcium, Ion: 1.04 mmol/L — ABNORMAL LOW (ref 1.15–1.40)
Chloride: 100 mmol/L (ref 98–111)
Creatinine, Ser: 0.5 mg/dL (ref 0.44–1.00)
Glucose, Bld: 204 mg/dL — ABNORMAL HIGH (ref 70–99)
HCT: 46 % (ref 36.0–46.0)
Hemoglobin: 15.6 g/dL — ABNORMAL HIGH (ref 12.0–15.0)
Potassium: 4.7 mmol/L (ref 3.5–5.1)
Sodium: 134 mmol/L — ABNORMAL LOW (ref 135–145)
TCO2: 24 mmol/L (ref 22–32)

## 2022-08-23 LAB — CBG MONITORING, ED: Glucose-Capillary: 204 mg/dL — ABNORMAL HIGH (ref 70–99)

## 2022-08-23 MED ORDER — IOHEXOL 350 MG/ML SOLN
75.0000 mL | Freq: Once | INTRAVENOUS | Status: AC | PRN
  Administered 2022-08-23: 75 mL via INTRAVENOUS

## 2022-08-23 MED ORDER — MORPHINE SULFATE (PF) 4 MG/ML IV SOLN
4.0000 mg | Freq: Once | INTRAVENOUS | Status: AC
  Administered 2022-08-23: 4 mg via INTRAVENOUS
  Filled 2022-08-23: qty 1

## 2022-08-23 MED ORDER — LORAZEPAM 2 MG/ML IJ SOLN
1.0000 mg | INTRAMUSCULAR | Status: DC | PRN
  Administered 2022-08-23: 2 mg via INTRAVENOUS
  Administered 2022-08-23: 3 mg via INTRAVENOUS
  Filled 2022-08-23: qty 1
  Filled 2022-08-23: qty 2

## 2022-08-23 MED ORDER — LORAZEPAM 1 MG PO TABS: 1.0000 mg | ORAL_TABLET | ORAL | Status: DC | PRN

## 2022-08-23 MED ORDER — LORAZEPAM 2 MG/ML IJ SOLN: 1.0000 mg | Freq: Once | INTRAMUSCULAR | Status: AC

## 2022-08-23 MED ORDER — THIAMINE HCL 100 MG/ML IJ SOLN
100.0000 mg | Freq: Every day | INTRAMUSCULAR | Status: DC
  Filled 2022-08-23: qty 2

## 2022-08-23 MED ORDER — ONDANSETRON HCL 4 MG/2ML IJ SOLN
4.0000 mg | Freq: Once | INTRAMUSCULAR | Status: AC
  Administered 2022-08-23: 4 mg via INTRAVENOUS
  Filled 2022-08-23: qty 2

## 2022-08-23 MED ORDER — LORAZEPAM 2 MG/ML IJ SOLN
INTRAMUSCULAR | Status: AC
  Administered 2022-08-23: 1 mg via INTRAVENOUS
  Filled 2022-08-23: qty 1

## 2022-08-23 MED ORDER — DEXTROSE IN LACTATED RINGERS 5 % IV SOLN
INTRAVENOUS | Status: AC
  Administered 2022-08-24: 1000 mL via INTRAVENOUS

## 2022-08-23 MED ORDER — THIAMINE HCL 100 MG/ML IJ SOLN
200.0000 mg | Freq: Once | INTRAVENOUS | Status: AC
  Administered 2022-08-23: 200 mg via INTRAVENOUS
  Filled 2022-08-23: qty 2

## 2022-08-23 NOTE — Assessment & Plan Note (Signed)
Add sliding scale insulin.

## 2022-08-23 NOTE — Assessment & Plan Note (Signed)
Continues to abuse illicit drugs on a frequent basis.

## 2022-08-23 NOTE — Assessment & Plan Note (Addendum)
Multiple UDS in the past and current UDS is positive for cocaine abuse.
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
## 2022-08-23 NOTE — Assessment & Plan Note (Addendum)
Observation telemetry bed.  Patient has been admitted to the hospital multiple times over the last several years.  She always has polysubstance abuse.  I think her seizure-like activity was likely due to cocaine abuse rather than alcohol withdrawal.  If she has another seizure, loaded her Keppra.  I do not think that she needs Keppra on a long-term basis.  Patient has never had alcohol withdrawal seizures in the past.  I Highly doubt she did have alcohol withdrawal seizures from only 40 ounces of beer.  Going back through the records, she appeared to have seizure-like activity back in July of last year.  She had been seen by neurology who also recommended that she not be continued on long-term Keppra.  Patient has a long history of noncompliance.  Doubt she would take Keppra on long-term basis.
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
## 2022-08-23 NOTE — ED Notes (Signed)
Ortho tech aware of brace and will apply once available.
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
## 2022-08-23 NOTE — Assessment & Plan Note (Signed)
Long history of aggressive behavior while being admitted to the hospital.  Security has had to be called multiple times due to the patient's behavior in the past.  Patient has been seen by psychiatry in the past and has needed to be involuntary committed and sedated chemically due to her aggressive and agitated behavior.
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
## 2022-08-23 NOTE — Assessment & Plan Note (Signed)
Chronically noncompliant with her medications.

## 2022-08-24 ENCOUNTER — Inpatient Hospital Stay (HOSPITAL_COMMUNITY): Payer: 59

## 2022-08-24 DIAGNOSIS — I700 Atherosclerosis of aorta: Secondary | ICD-10-CM | POA: Diagnosis present

## 2022-08-24 DIAGNOSIS — S20212A Contusion of left front wall of thorax, initial encounter: Secondary | ICD-10-CM | POA: Diagnosis not present

## 2022-08-24 DIAGNOSIS — E785 Hyperlipidemia, unspecified: Secondary | ICD-10-CM | POA: Diagnosis not present

## 2022-08-24 DIAGNOSIS — E871 Hypo-osmolality and hyponatremia: Secondary | ICD-10-CM | POA: Diagnosis not present

## 2022-08-24 DIAGNOSIS — Z825 Family history of asthma and other chronic lower respiratory diseases: Secondary | ICD-10-CM | POA: Diagnosis not present

## 2022-08-24 DIAGNOSIS — S72041A Displaced fracture of base of neck of right femur, initial encounter for closed fracture: Secondary | ICD-10-CM | POA: Diagnosis not present

## 2022-08-24 DIAGNOSIS — F1510 Other stimulant abuse, uncomplicated: Secondary | ICD-10-CM | POA: Diagnosis present

## 2022-08-24 DIAGNOSIS — S3993XA Unspecified injury of pelvis, initial encounter: Secondary | ICD-10-CM | POA: Diagnosis not present

## 2022-08-24 DIAGNOSIS — J449 Chronic obstructive pulmonary disease, unspecified: Secondary | ICD-10-CM | POA: Diagnosis not present

## 2022-08-24 DIAGNOSIS — M25561 Pain in right knee: Secondary | ICD-10-CM | POA: Diagnosis not present

## 2022-08-24 DIAGNOSIS — E8809 Other disorders of plasma-protein metabolism, not elsewhere classified: Secondary | ICD-10-CM | POA: Diagnosis not present

## 2022-08-24 DIAGNOSIS — Z833 Family history of diabetes mellitus: Secondary | ICD-10-CM | POA: Diagnosis not present

## 2022-08-24 DIAGNOSIS — E1140 Type 2 diabetes mellitus with diabetic neuropathy, unspecified: Secondary | ICD-10-CM | POA: Diagnosis not present

## 2022-08-24 DIAGNOSIS — F14188 Cocaine abuse with other cocaine-induced disorder: Secondary | ICD-10-CM | POA: Diagnosis present

## 2022-08-24 DIAGNOSIS — F1410 Cocaine abuse, uncomplicated: Secondary | ICD-10-CM | POA: Diagnosis not present

## 2022-08-24 DIAGNOSIS — S32019A Unspecified fracture of first lumbar vertebra, initial encounter for closed fracture: Secondary | ICD-10-CM | POA: Diagnosis not present

## 2022-08-24 DIAGNOSIS — S82141A Displaced bicondylar fracture of right tibia, initial encounter for closed fracture: Secondary | ICD-10-CM | POA: Diagnosis not present

## 2022-08-24 DIAGNOSIS — Z041 Encounter for examination and observation following transport accident: Secondary | ICD-10-CM | POA: Diagnosis not present

## 2022-08-24 DIAGNOSIS — I10 Essential (primary) hypertension: Secondary | ICD-10-CM | POA: Diagnosis not present

## 2022-08-24 DIAGNOSIS — I4891 Unspecified atrial fibrillation: Secondary | ICD-10-CM | POA: Diagnosis not present

## 2022-08-24 DIAGNOSIS — J439 Emphysema, unspecified: Secondary | ICD-10-CM | POA: Diagnosis present

## 2022-08-24 DIAGNOSIS — S0240EA Zygomatic fracture, right side, initial encounter for closed fracture: Secondary | ICD-10-CM | POA: Diagnosis not present

## 2022-08-24 DIAGNOSIS — S82251A Displaced comminuted fracture of shaft of right tibia, initial encounter for closed fracture: Secondary | ICD-10-CM | POA: Diagnosis not present

## 2022-08-24 DIAGNOSIS — S01411A Laceration without foreign body of right cheek and temporomandibular area, initial encounter: Secondary | ICD-10-CM | POA: Diagnosis not present

## 2022-08-24 DIAGNOSIS — D62 Acute posthemorrhagic anemia: Secondary | ICD-10-CM | POA: Diagnosis not present

## 2022-08-24 DIAGNOSIS — S42032A Displaced fracture of lateral end of left clavicle, initial encounter for closed fracture: Secondary | ICD-10-CM | POA: Diagnosis not present

## 2022-08-24 DIAGNOSIS — S8991XA Unspecified injury of right lower leg, initial encounter: Secondary | ICD-10-CM | POA: Diagnosis not present

## 2022-08-24 DIAGNOSIS — F1910 Other psychoactive substance abuse, uncomplicated: Secondary | ICD-10-CM | POA: Diagnosis not present

## 2022-08-24 DIAGNOSIS — F10139 Alcohol abuse with withdrawal, unspecified: Secondary | ICD-10-CM | POA: Diagnosis not present

## 2022-08-24 DIAGNOSIS — G9341 Metabolic encephalopathy: Secondary | ICD-10-CM | POA: Diagnosis present

## 2022-08-24 DIAGNOSIS — E119 Type 2 diabetes mellitus without complications: Secondary | ICD-10-CM | POA: Diagnosis not present

## 2022-08-24 DIAGNOSIS — G43909 Migraine, unspecified, not intractable, without status migrainosus: Secondary | ICD-10-CM | POA: Diagnosis present

## 2022-08-24 DIAGNOSIS — S3991XA Unspecified injury of abdomen, initial encounter: Secondary | ICD-10-CM | POA: Diagnosis not present

## 2022-08-24 DIAGNOSIS — S82101A Unspecified fracture of upper end of right tibia, initial encounter for closed fracture: Secondary | ICD-10-CM | POA: Diagnosis not present

## 2022-08-24 DIAGNOSIS — F17210 Nicotine dependence, cigarettes, uncomplicated: Secondary | ICD-10-CM | POA: Diagnosis present

## 2022-08-24 DIAGNOSIS — Z23 Encounter for immunization: Secondary | ICD-10-CM | POA: Diagnosis not present

## 2022-08-24 DIAGNOSIS — S0231XA Fracture of orbital floor, right side, initial encounter for closed fracture: Secondary | ICD-10-CM | POA: Diagnosis not present

## 2022-08-24 DIAGNOSIS — F19188 Other psychoactive substance abuse with other psychoactive substance-induced disorder: Secondary | ICD-10-CM | POA: Diagnosis present

## 2022-08-24 DIAGNOSIS — S82191A Other fracture of upper end of right tibia, initial encounter for closed fracture: Secondary | ICD-10-CM | POA: Diagnosis not present

## 2022-08-24 DIAGNOSIS — T1490XA Injury, unspecified, initial encounter: Secondary | ICD-10-CM | POA: Diagnosis not present

## 2022-08-24 DIAGNOSIS — Z8249 Family history of ischemic heart disease and other diseases of the circulatory system: Secondary | ICD-10-CM | POA: Diagnosis not present

## 2022-08-24 DIAGNOSIS — R569 Unspecified convulsions: Secondary | ICD-10-CM | POA: Diagnosis not present

## 2022-08-24 DIAGNOSIS — G40109 Localization-related (focal) (partial) symptomatic epilepsy and epileptic syndromes with simple partial seizures, not intractable, without status epilepticus: Secondary | ICD-10-CM | POA: Diagnosis present

## 2022-08-24 DIAGNOSIS — Z9151 Personal history of suicidal behavior: Secondary | ICD-10-CM | POA: Diagnosis not present

## 2022-08-24 DIAGNOSIS — G4089 Other seizures: Secondary | ICD-10-CM | POA: Diagnosis present

## 2022-08-24 DIAGNOSIS — I6782 Cerebral ischemia: Secondary | ICD-10-CM | POA: Diagnosis not present

## 2022-08-24 DIAGNOSIS — S72001A Fracture of unspecified part of neck of right femur, initial encounter for closed fracture: Secondary | ICD-10-CM | POA: Diagnosis not present

## 2022-08-24 DIAGNOSIS — S299XXA Unspecified injury of thorax, initial encounter: Secondary | ICD-10-CM | POA: Diagnosis not present

## 2022-08-24 DIAGNOSIS — S0281XA Fracture of other specified skull and facial bones, right side, initial encounter for closed fracture: Secondary | ICD-10-CM | POA: Diagnosis not present

## 2022-08-24 DIAGNOSIS — R4689 Other symptoms and signs involving appearance and behavior: Secondary | ICD-10-CM | POA: Diagnosis not present

## 2022-08-24 DIAGNOSIS — S0240CA Maxillary fracture, right side, initial encounter for closed fracture: Secondary | ICD-10-CM | POA: Diagnosis not present

## 2022-08-24 DIAGNOSIS — S72011A Unspecified intracapsular fracture of right femur, initial encounter for closed fracture: Secondary | ICD-10-CM | POA: Diagnosis not present

## 2022-08-24 DIAGNOSIS — S02401A Maxillary fracture, unspecified, initial encounter for closed fracture: Secondary | ICD-10-CM | POA: Diagnosis not present

## 2022-08-24 DIAGNOSIS — Z1152 Encounter for screening for COVID-19: Secondary | ICD-10-CM | POA: Diagnosis not present

## 2022-08-24 DIAGNOSIS — F11188 Opioid abuse with other opioid-induced disorder: Secondary | ICD-10-CM | POA: Diagnosis present

## 2022-08-24 DIAGNOSIS — M79604 Pain in right leg: Secondary | ICD-10-CM | POA: Diagnosis not present

## 2022-08-24 DIAGNOSIS — E039 Hypothyroidism, unspecified: Secondary | ICD-10-CM | POA: Diagnosis not present

## 2022-08-24 DIAGNOSIS — F250 Schizoaffective disorder, bipolar type: Secondary | ICD-10-CM | POA: Diagnosis not present

## 2022-08-24 LAB — CBC WITH DIFFERENTIAL/PLATELET
Abs Immature Granulocytes: 0.02 10*3/uL (ref 0.00–0.07)
Basophils Absolute: 0 10*3/uL (ref 0.0–0.1)
Basophils Relative: 1 %
Eosinophils Absolute: 0 10*3/uL (ref 0.0–0.5)
Eosinophils Relative: 0 %
HCT: 41 % (ref 36.0–46.0)
Hemoglobin: 13.3 g/dL (ref 12.0–15.0)
Immature Granulocytes: 0 %
Lymphocytes Relative: 31 %
Lymphs Abs: 1.8 10*3/uL (ref 0.7–4.0)
MCH: 28.4 pg (ref 26.0–34.0)
MCHC: 32.4 g/dL (ref 30.0–36.0)
MCV: 87.6 fL (ref 80.0–100.0)
Monocytes Absolute: 0.5 10*3/uL (ref 0.1–1.0)
Monocytes Relative: 8 %
Neutro Abs: 3.4 10*3/uL (ref 1.7–7.7)
Neutrophils Relative %: 60 %
Platelets: 243 10*3/uL (ref 150–400)
RBC: 4.68 MIL/uL (ref 3.87–5.11)
RDW: 15.1 % (ref 11.5–15.5)
WBC: 5.7 10*3/uL (ref 4.0–10.5)
nRBC: 0 % (ref 0.0–0.2)

## 2022-08-24 LAB — COMPREHENSIVE METABOLIC PANEL
ALT: 12 U/L (ref 0–44)
AST: 16 U/L (ref 15–41)
Albumin: 3.2 g/dL — ABNORMAL LOW (ref 3.5–5.0)
Alkaline Phosphatase: 124 U/L (ref 38–126)
Anion gap: 13 (ref 5–15)
BUN: 8 mg/dL (ref 6–20)
CO2: 24 mmol/L (ref 22–32)
Calcium: 8.9 mg/dL (ref 8.9–10.3)
Chloride: 101 mmol/L (ref 98–111)
Creatinine, Ser: 0.51 mg/dL (ref 0.44–1.00)
GFR, Estimated: >60 mL/min (ref >60)
Glucose, Bld: 128 mg/dL — ABNORMAL HIGH (ref 70–99)
Potassium: 4.9 mmol/L (ref 3.5–5.1)
Sodium: 138 mmol/L (ref 135–145)
Total Bilirubin: 1.9 mg/dL — ABNORMAL HIGH (ref 0.3–1.2)
Total Protein: 6.8 g/dL (ref 6.5–8.1)

## 2022-08-24 LAB — GLUCOSE, CAPILLARY: Glucose-Capillary: 353 mg/dL — ABNORMAL HIGH (ref 70–99)

## 2022-08-24 LAB — MAGNESIUM: Magnesium: 2.2 mg/dL (ref 1.7–2.4)

## 2022-08-24 MED ORDER — THIAMINE MONONITRATE 100 MG PO TABS
100.0000 mg | ORAL_TABLET | Freq: Every day | ORAL | Status: DC
  Administered 2022-08-24 – 2022-08-25 (×2): 100 mg via ORAL
  Filled 2022-08-24 (×2): qty 1

## 2022-08-24 MED ORDER — OLANZAPINE 10 MG IM SOLR
10.0000 mg | Freq: Once | INTRAMUSCULAR | Status: AC | PRN
  Administered 2022-08-24: 10 mg via INTRAMUSCULAR
  Filled 2022-08-24: qty 10

## 2022-08-24 MED ORDER — GABAPENTIN 300 MG PO CAPS
300.0000 mg | ORAL_CAPSULE | Freq: Three times a day (TID) | ORAL | Status: DC
  Administered 2022-08-24: 300 mg via ORAL
  Filled 2022-08-24: qty 1

## 2022-08-24 MED ORDER — GABAPENTIN 300 MG PO CAPS: 600.0000 mg | ORAL_CAPSULE | Freq: Three times a day (TID) | ORAL | Status: DC

## 2022-08-24 MED ORDER — GABAPENTIN 300 MG PO CAPS
300.0000 mg | ORAL_CAPSULE | Freq: Once | ORAL | Status: AC
  Administered 2022-08-24: 300 mg via ORAL
  Filled 2022-08-24: qty 1

## 2022-08-24 MED ORDER — ZONISAMIDE 100 MG PO CAPS
100.0000 mg | ORAL_CAPSULE | Freq: Every day | ORAL | Status: DC
  Administered 2022-08-24: 100 mg via ORAL
  Filled 2022-08-24 (×2): qty 1

## 2022-08-24 MED ORDER — FOLIC ACID 1 MG PO TABS
1.0000 mg | ORAL_TABLET | Freq: Every day | ORAL | Status: DC
  Administered 2022-08-24 – 2022-08-25 (×2): 1 mg via ORAL
  Filled 2022-08-24 (×2): qty 1

## 2022-08-24 MED ORDER — THIAMINE HCL 100 MG/ML IJ SOLN: 100.0000 mg | Freq: Every day | INTRAMUSCULAR | Status: DC

## 2022-08-24 MED ORDER — QUETIAPINE FUMARATE ER 200 MG PO TB24
200.0000 mg | ORAL_TABLET | Freq: Every day | ORAL | Status: DC
  Administered 2022-08-24: 200 mg via ORAL
  Filled 2022-08-24 (×2): qty 1

## 2022-08-24 MED ORDER — GADOBUTROL 1 MMOL/ML IV SOLN
4.9000 mL | Freq: Once | INTRAVENOUS | Status: AC | PRN
  Administered 2022-08-24: 4.9 mL via INTRAVENOUS

## 2022-08-24 MED ORDER — LORAZEPAM 2 MG/ML IJ SOLN
0.5000 mg | Freq: Once | INTRAMUSCULAR | Status: AC
  Administered 2022-08-24: 0.5 mg via INTRAVENOUS
  Filled 2022-08-24: qty 1

## 2022-08-24 MED ORDER — ONDANSETRON HCL 4 MG/2ML IJ SOLN: 4.0000 mg | Freq: Four times a day (QID) | INTRAMUSCULAR | Status: DC | PRN

## 2022-08-24 MED ORDER — HALOPERIDOL LACTATE 5 MG/ML IJ SOLN: 2.0000 mg | Freq: Once | INTRAMUSCULAR | Status: DC

## 2022-08-24 MED ORDER — PREGABALIN 75 MG PO CAPS: 75.0000 mg | ORAL_CAPSULE | Freq: Two times a day (BID) | ORAL | Status: DC

## 2022-08-24 MED ORDER — LORAZEPAM 1 MG PO TABS
1.0000 mg | ORAL_TABLET | ORAL | Status: DC | PRN
  Administered 2022-08-24: 2 mg via ORAL
  Administered 2022-08-24: 1 mg via ORAL
  Administered 2022-08-25 (×2): 2 mg via ORAL
  Filled 2022-08-24 (×3): qty 2
  Filled 2022-08-24: qty 1

## 2022-08-24 MED ORDER — MIDODRINE HCL 5 MG PO TABS
10.0000 mg | ORAL_TABLET | Freq: Once | ORAL | Status: AC
  Administered 2022-08-25: 10 mg via ORAL
  Filled 2022-08-24: qty 2

## 2022-08-24 MED ORDER — SODIUM CHLORIDE 0.9 % IV BOLUS
250.0000 mL | Freq: Once | INTRAVENOUS | Status: AC
  Administered 2022-08-24: 250 mL via INTRAVENOUS

## 2022-08-24 MED ORDER — LACTATED RINGERS IV SOLN: INTRAVENOUS | Status: DC

## 2022-08-24 MED ORDER — STERILE WATER FOR INJECTION IJ SOLN
INTRAMUSCULAR | Status: AC
  Filled 2022-08-24: qty 10

## 2022-08-24 MED ORDER — ADULT MULTIVITAMIN W/MINERALS CH
1.0000 | ORAL_TABLET | Freq: Every day | ORAL | Status: DC
  Administered 2022-08-24 – 2022-08-25 (×2): 1 via ORAL
  Filled 2022-08-24 (×2): qty 1

## 2022-08-24 MED ORDER — HALOPERIDOL LACTATE 5 MG/ML IJ SOLN: 1.0000 mg | Freq: Once | INTRAMUSCULAR | Status: DC

## 2022-08-24 NOTE — Procedures (Signed)
History: 57 yo F with seizures and substance abuse  Sedation: none  Technique: This EEG was acquired with electrodes placed according to the International 10-20 electrode system (including Fp1, Fp2, F3, F4, C3, C4, P3, P4, O1, O2, T3, T4, T5, T6, A1, A2, Fz, Cz, Pz). The following electrodes were missing or displaced: none.   Background: The background consists of intermixed alpha and beta activities. There is a well defined posterior dominant rhythm of 9-10 Hz. Sleep is recorded with normal appearing structures.  She has a single sharp wave which is maximal in the right temporal region, F8 >T8 > P8.  This is seen with a wide field  Photic stimulation: Physiologic driving is present  EEG Abnormalities: 1) Right temporal sharp wave  Clinical Interpretation: This EEG is suggestive of a potential area of epileptogenicity in the right temporal region. There was no seizure or seizure predisposition recorded on this study. Please note that lack of epileptiform activity on EEG does not preclude the possibility of epilepsy.   Roland Rack, MD [facility] [phone]  If 7pm- 7am, please page neurology on call as listed in [place].
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
## 2022-08-24 NOTE — Progress Notes (Signed)
Orthopedic Tech Progress Note Patient Details:  [name] [birth year] [id number]  TLSO adjusted and at bedside. Daughter is at bedside and is aware of how to apply/adjust/remove brace as well.  Ortho Devices Type of Ortho Device: Thoracolumbar corset (TLSO) Ortho Device/Splint Location: adjusted to pt, at bedside Ortho Device/Splint Interventions: Ordered, Adjustment   Post Interventions Patient Tolerated: Well Instructions Provided: Care of device, Adjustment of device  Darsha Zumstein Jeri Modena 08/24/2022, 5:54 PM

## 2022-08-24 NOTE — Progress Notes (Signed)
Pt arrived to unit, alert and oriented. Agitated, asking for food. No family at bedside. Bed alarms on. Cardiac tele connected to CCMD.
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
## 2022-08-24 NOTE — Progress Notes (Signed)
EEG complete - results pending 
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
## 2022-08-25 ENCOUNTER — Encounter (HOSPITAL_COMMUNITY): Payer: Self-pay

## 2022-08-25 ENCOUNTER — Other Ambulatory Visit: Payer: Self-pay

## 2022-08-25 ENCOUNTER — Inpatient Hospital Stay (HOSPITAL_COMMUNITY)
Admission: EM | Admit: 2022-08-25 | Discharge: 2022-09-10 | DRG: 522 | Disposition: A | Discharge status: Home / Self Care | Payer: 59 | Attending: Surgery | Admitting: Surgery

## 2022-08-25 ENCOUNTER — Emergency Department (HOSPITAL_COMMUNITY): Payer: 59

## 2022-08-25 DIAGNOSIS — S0240EA Zygomatic fracture, right side, initial encounter for closed fracture: Principal | ICD-10-CM | POA: Diagnosis present

## 2022-08-25 DIAGNOSIS — M549 Dorsalgia, unspecified: Secondary | ICD-10-CM | POA: Diagnosis present

## 2022-08-25 DIAGNOSIS — J449 Chronic obstructive pulmonary disease, unspecified: Secondary | ICD-10-CM | POA: Diagnosis not present

## 2022-08-25 DIAGNOSIS — S5002XA Contusion of left elbow, initial encounter: Secondary | ICD-10-CM | POA: Diagnosis present

## 2022-08-25 DIAGNOSIS — E1165 Type 2 diabetes mellitus with hyperglycemia: Secondary | ICD-10-CM | POA: Diagnosis present

## 2022-08-25 DIAGNOSIS — G8929 Other chronic pain: Secondary | ICD-10-CM | POA: Diagnosis present

## 2022-08-25 DIAGNOSIS — S0240CA Maxillary fracture, right side, initial encounter for closed fracture: Secondary | ICD-10-CM | POA: Diagnosis not present

## 2022-08-25 DIAGNOSIS — F10939 Alcohol use, unspecified with withdrawal, unspecified: Secondary | ICD-10-CM | POA: Insufficient documentation

## 2022-08-25 DIAGNOSIS — Z9049 Acquired absence of other specified parts of digestive tract: Secondary | ICD-10-CM

## 2022-08-25 DIAGNOSIS — S01411A Laceration without foreign body of right cheek and temporomandibular area, initial encounter: Secondary | ICD-10-CM | POA: Diagnosis present

## 2022-08-25 DIAGNOSIS — E871 Hypo-osmolality and hyponatremia: Secondary | ICD-10-CM | POA: Diagnosis not present

## 2022-08-25 DIAGNOSIS — Z90710 Acquired absence of both cervix and uterus: Secondary | ICD-10-CM

## 2022-08-25 DIAGNOSIS — S72001A Fracture of unspecified part of neck of right femur, initial encounter for closed fracture: Secondary | ICD-10-CM | POA: Diagnosis not present

## 2022-08-25 DIAGNOSIS — S0231XA Fracture of orbital floor, right side, initial encounter for closed fracture: Secondary | ICD-10-CM | POA: Diagnosis not present

## 2022-08-25 DIAGNOSIS — R109 Unspecified abdominal pain: Secondary | ICD-10-CM | POA: Diagnosis present

## 2022-08-25 DIAGNOSIS — D62 Acute posthemorrhagic anemia: Secondary | ICD-10-CM | POA: Diagnosis not present

## 2022-08-25 DIAGNOSIS — E1140 Type 2 diabetes mellitus with diabetic neuropathy, unspecified: Secondary | ICD-10-CM | POA: Diagnosis present

## 2022-08-25 DIAGNOSIS — F10930 Alcohol use, unspecified with withdrawal, uncomplicated: Secondary | ICD-10-CM

## 2022-08-25 DIAGNOSIS — Z23 Encounter for immunization: Secondary | ICD-10-CM | POA: Diagnosis not present

## 2022-08-25 DIAGNOSIS — E039 Hypothyroidism, unspecified: Secondary | ICD-10-CM | POA: Diagnosis present

## 2022-08-25 DIAGNOSIS — Z825 Family history of asthma and other chronic lower respiratory diseases: Secondary | ICD-10-CM

## 2022-08-25 DIAGNOSIS — S82101A Unspecified fracture of upper end of right tibia, initial encounter for closed fracture: Secondary | ICD-10-CM | POA: Diagnosis not present

## 2022-08-25 DIAGNOSIS — S42032A Displaced fracture of lateral end of left clavicle, initial encounter for closed fracture: Secondary | ICD-10-CM | POA: Diagnosis present

## 2022-08-25 DIAGNOSIS — S82141A Displaced bicondylar fracture of right tibia, initial encounter for closed fracture: Secondary | ICD-10-CM | POA: Diagnosis not present

## 2022-08-25 DIAGNOSIS — S42302D Unspecified fracture of shaft of humerus, left arm, subsequent encounter for fracture with routine healing: Secondary | ICD-10-CM

## 2022-08-25 DIAGNOSIS — I4891 Unspecified atrial fibrillation: Secondary | ICD-10-CM | POA: Diagnosis present

## 2022-08-25 DIAGNOSIS — Y9301 Activity, walking, marching and hiking: Secondary | ICD-10-CM | POA: Diagnosis present

## 2022-08-25 DIAGNOSIS — S02401A Maxillary fracture, unspecified, initial encounter for closed fracture: Secondary | ICD-10-CM | POA: Diagnosis not present

## 2022-08-25 DIAGNOSIS — Y905 Blood alcohol level of 100-119 mg/100 ml: Secondary | ICD-10-CM | POA: Diagnosis present

## 2022-08-25 DIAGNOSIS — S3991XA Unspecified injury of abdomen, initial encounter: Secondary | ICD-10-CM | POA: Diagnosis not present

## 2022-08-25 DIAGNOSIS — M25571 Pain in right ankle and joints of right foot: Secondary | ICD-10-CM | POA: Diagnosis present

## 2022-08-25 DIAGNOSIS — F17210 Nicotine dependence, cigarettes, uncomplicated: Secondary | ICD-10-CM | POA: Diagnosis present

## 2022-08-25 DIAGNOSIS — E876 Hypokalemia: Secondary | ICD-10-CM | POA: Diagnosis present

## 2022-08-25 DIAGNOSIS — I10 Essential (primary) hypertension: Secondary | ICD-10-CM | POA: Diagnosis present

## 2022-08-25 DIAGNOSIS — M25551 Pain in right hip: Secondary | ICD-10-CM | POA: Diagnosis not present

## 2022-08-25 DIAGNOSIS — S0281XA Fracture of other specified skull and facial bones, right side, initial encounter for closed fracture: Secondary | ICD-10-CM

## 2022-08-25 DIAGNOSIS — Z635 Disruption of family by separation and divorce: Secondary | ICD-10-CM

## 2022-08-25 DIAGNOSIS — S72011A Unspecified intracapsular fracture of right femur, initial encounter for closed fracture: Principal | ICD-10-CM | POA: Diagnosis present

## 2022-08-25 DIAGNOSIS — Z041 Encounter for examination and observation following transport accident: Secondary | ICD-10-CM | POA: Diagnosis not present

## 2022-08-25 DIAGNOSIS — Z888 Allergy status to other drugs, medicaments and biological substances status: Secondary | ICD-10-CM

## 2022-08-25 DIAGNOSIS — S82191A Other fracture of upper end of right tibia, initial encounter for closed fracture: Secondary | ICD-10-CM | POA: Diagnosis not present

## 2022-08-25 DIAGNOSIS — S3993XA Unspecified injury of pelvis, initial encounter: Secondary | ICD-10-CM | POA: Diagnosis not present

## 2022-08-25 DIAGNOSIS — Y92410 Unspecified street and highway as the place of occurrence of the external cause: Secondary | ICD-10-CM

## 2022-08-25 DIAGNOSIS — F1410 Cocaine abuse, uncomplicated: Secondary | ICD-10-CM | POA: Diagnosis present

## 2022-08-25 DIAGNOSIS — F1010 Alcohol abuse, uncomplicated: Secondary | ICD-10-CM | POA: Diagnosis present

## 2022-08-25 DIAGNOSIS — S30811A Abrasion of abdominal wall, initial encounter: Secondary | ICD-10-CM | POA: Diagnosis present

## 2022-08-25 DIAGNOSIS — G40409 Other generalized epilepsy and epileptic syndromes, not intractable, without status epilepticus: Secondary | ICD-10-CM | POA: Diagnosis present

## 2022-08-25 DIAGNOSIS — S20212A Contusion of left front wall of thorax, initial encounter: Secondary | ICD-10-CM | POA: Diagnosis not present

## 2022-08-25 DIAGNOSIS — E8809 Other disorders of plasma-protein metabolism, not elsewhere classified: Secondary | ICD-10-CM | POA: Diagnosis present

## 2022-08-25 DIAGNOSIS — F259 Schizoaffective disorder, unspecified: Secondary | ICD-10-CM | POA: Diagnosis present

## 2022-08-25 DIAGNOSIS — Z8249 Family history of ischemic heart disease and other diseases of the circulatory system: Secondary | ICD-10-CM

## 2022-08-25 DIAGNOSIS — S299XXA Unspecified injury of thorax, initial encounter: Secondary | ICD-10-CM | POA: Diagnosis not present

## 2022-08-25 DIAGNOSIS — Z91148 Patient's other noncompliance with medication regimen for other reason: Secondary | ICD-10-CM

## 2022-08-25 DIAGNOSIS — S82251A Displaced comminuted fracture of shaft of right tibia, initial encounter for closed fracture: Secondary | ICD-10-CM | POA: Diagnosis not present

## 2022-08-25 DIAGNOSIS — F319 Bipolar disorder, unspecified: Secondary | ICD-10-CM | POA: Diagnosis present

## 2022-08-25 DIAGNOSIS — Z79899 Other long term (current) drug therapy: Secondary | ICD-10-CM

## 2022-08-25 DIAGNOSIS — X58XXXD Exposure to other specified factors, subsequent encounter: Secondary | ICD-10-CM | POA: Diagnosis present

## 2022-08-25 DIAGNOSIS — T1490XA Injury, unspecified, initial encounter: Secondary | ICD-10-CM | POA: Diagnosis present

## 2022-08-25 DIAGNOSIS — M25561 Pain in right knee: Secondary | ICD-10-CM | POA: Diagnosis not present

## 2022-08-25 DIAGNOSIS — K219 Gastro-esophageal reflux disease without esophagitis: Secondary | ICD-10-CM | POA: Diagnosis present

## 2022-08-25 DIAGNOSIS — S32019A Unspecified fracture of first lumbar vertebra, initial encounter for closed fracture: Secondary | ICD-10-CM | POA: Diagnosis not present

## 2022-08-25 DIAGNOSIS — R4189 Other symptoms and signs involving cognitive functions and awareness: Secondary | ICD-10-CM | POA: Diagnosis present

## 2022-08-25 DIAGNOSIS — S72041A Displaced fracture of base of neck of right femur, initial encounter for closed fracture: Secondary | ICD-10-CM | POA: Diagnosis not present

## 2022-08-25 DIAGNOSIS — E785 Hyperlipidemia, unspecified: Secondary | ICD-10-CM | POA: Diagnosis present

## 2022-08-25 DIAGNOSIS — R569 Unspecified convulsions: Secondary | ICD-10-CM

## 2022-08-25 DIAGNOSIS — Z9884 Bariatric surgery status: Secondary | ICD-10-CM

## 2022-08-25 DIAGNOSIS — F10129 Alcohol abuse with intoxication, unspecified: Secondary | ICD-10-CM | POA: Diagnosis present

## 2022-08-25 DIAGNOSIS — Z886 Allergy status to analgesic agent status: Secondary | ICD-10-CM

## 2022-08-25 DIAGNOSIS — Z9151 Personal history of suicidal behavior: Secondary | ICD-10-CM

## 2022-08-25 DIAGNOSIS — Z833 Family history of diabetes mellitus: Secondary | ICD-10-CM

## 2022-08-25 LAB — COMPREHENSIVE METABOLIC PANEL
ALT: 43 U/L (ref 0–44)
AST: 163 U/L — ABNORMAL HIGH (ref 15–41)
Albumin: 3 g/dL — ABNORMAL LOW (ref 3.5–5.0)
Alkaline Phosphatase: 105 U/L (ref 38–126)
Anion gap: 10 (ref 5–15)
BUN: 8 mg/dL (ref 6–20)
CO2: 22 mmol/L (ref 22–32)
Calcium: 7.9 mg/dL — ABNORMAL LOW (ref 8.9–10.3)
Chloride: 101 mmol/L (ref 98–111)
Creatinine, Ser: 0.46 mg/dL (ref 0.44–1.00)
GFR, Estimated: >60 mL/min (ref >60)
Glucose, Bld: 198 mg/dL — ABNORMAL HIGH (ref 70–99)
Potassium: 3.3 mmol/L — ABNORMAL LOW (ref 3.5–5.1)
Sodium: 133 mmol/L — ABNORMAL LOW (ref 135–145)
Total Bilirubin: 0.9 mg/dL (ref 0.3–1.2)
Total Protein: 6.2 g/dL — ABNORMAL LOW (ref 6.5–8.1)

## 2022-08-25 LAB — I-STAT CHEM 8, ED
BUN: 9 mg/dL (ref 6–20)
Calcium, Ion: 1.01 mmol/L — ABNORMAL LOW (ref 1.15–1.40)
Chloride: 104 mmol/L (ref 98–111)
Creatinine, Ser: 0.5 mg/dL (ref 0.44–1.00)
Glucose, Bld: 186 mg/dL — ABNORMAL HIGH (ref 70–99)
HCT: 37 % (ref 36.0–46.0)
Hemoglobin: 12.6 g/dL (ref 12.0–15.0)
Potassium: 3.7 mmol/L (ref 3.5–5.1)
Sodium: 134 mmol/L — ABNORMAL LOW (ref 135–145)
TCO2: 20 mmol/L — ABNORMAL LOW (ref 22–32)

## 2022-08-25 LAB — CBC
HCT: 32.9 % — ABNORMAL LOW (ref 36.0–46.0)
Hemoglobin: 10.3 g/dL — ABNORMAL LOW (ref 12.0–15.0)
MCH: 28.5 pg (ref 26.0–34.0)
MCHC: 31.3 g/dL (ref 30.0–36.0)
MCV: 90.9 fL (ref 80.0–100.0)
Platelets: 195 10*3/uL (ref 150–400)
RBC: 3.62 MIL/uL — ABNORMAL LOW (ref 3.87–5.11)
RDW: 15 % (ref 11.5–15.5)
WBC: 6.2 10*3/uL (ref 4.0–10.5)
nRBC: 0 % (ref 0.0–0.2)

## 2022-08-25 LAB — GLUCOSE, CAPILLARY
Glucose-Capillary: 276 mg/dL — ABNORMAL HIGH (ref 70–99)
Glucose-Capillary: 202 mg/dL — ABNORMAL HIGH (ref 70–99)
Glucose-Capillary: 282 mg/dL — ABNORMAL HIGH (ref 70–99)
Glucose-Capillary: 199 mg/dL — ABNORMAL HIGH (ref 70–99)
Glucose-Capillary: 164 mg/dL — ABNORMAL HIGH (ref 70–99)

## 2022-08-25 LAB — ETHANOL: Alcohol, Ethyl (B): 107 mg/dL — ABNORMAL HIGH (ref <10)

## 2022-08-25 LAB — PROTIME-INR
INR: 1.1 (ref 0.8–1.2)
Prothrombin Time: 14.3 seconds (ref 11.4–15.2)

## 2022-08-25 LAB — LACTIC ACID, PLASMA: Lactic Acid, Venous: 2.7 mmol/L (ref 0.5–1.9)

## 2022-08-25 LAB — SAMPLE TO BLOOD BANK

## 2022-08-25 MED ORDER — TETANUS-DIPHTH-ACELL PERTUSSIS 5-2.5-18.5 LF-MCG/0.5 IM SUSY
0.5000 mL | PREFILLED_SYRINGE | Freq: Once | INTRAMUSCULAR | Status: AC
  Administered 2022-08-25: 0.5 mL via INTRAMUSCULAR
  Filled 2022-08-25: qty 0.5

## 2022-08-25 MED ORDER — VITAMIN B-1 100 MG PO TABS: 100.0000 mg | ORAL_TABLET | Freq: Every day | ORAL | Status: DC

## 2022-08-25 MED ORDER — LIDOCAINE-EPINEPHRINE-TETRACAINE (LET) TOPICAL GEL
3.0000 mL | Freq: Once | TOPICAL | Status: AC
  Administered 2022-08-25: 3 mL via TOPICAL
  Filled 2022-08-25: qty 3

## 2022-08-25 MED ORDER — FENTANYL CITRATE PF 50 MCG/ML IJ SOSY
100.0000 ug | PREFILLED_SYRINGE | Freq: Once | INTRAMUSCULAR | Status: AC
  Administered 2022-08-25: 100 ug via INTRAVENOUS
  Filled 2022-08-25: qty 2

## 2022-08-25 MED ORDER — FENTANYL CITRATE (PF) 100 MCG/2ML IJ SOLN
INTRAMUSCULAR | Status: AC
  Administered 2022-08-25: 100 ug
  Filled 2022-08-25: qty 2

## 2022-08-25 MED ORDER — ACETAMINOPHEN 325 MG PO TABS
650.0000 mg | ORAL_TABLET | Freq: Four times a day (QID) | ORAL | Status: DC | PRN
  Administered 2022-08-25: 650 mg via ORAL
  Filled 2022-08-25 (×2): qty 2

## 2022-08-25 MED ORDER — FOLIC ACID 1 MG PO TABS: 1.0000 mg | ORAL_TABLET | Freq: Every day | ORAL | Status: DC

## 2022-08-25 MED ORDER — ZONISAMIDE 100 MG PO CAPS: 100.0000 mg | ORAL_CAPSULE | Freq: Every day | ORAL | 2 refills | Status: DC

## 2022-08-25 MED ORDER — IOHEXOL 350 MG/ML SOLN
75.0000 mL | Freq: Once | INTRAVENOUS | Status: AC | PRN
  Administered 2022-08-25: 75 mL via INTRAVENOUS

## 2022-08-25 MED ORDER — SODIUM CHLORIDE 0.9 % IV SOLN: INTRAVENOUS | Status: DC

## 2022-08-25 MED ORDER — INSULIN ASPART 100 UNIT/ML IJ SOLN
0.0000 [IU] | Freq: Three times a day (TID) | INTRAMUSCULAR | Status: DC
  Administered 2022-08-25: 11 [IU] via SUBCUTANEOUS
  Administered 2022-08-25: 7 [IU] via SUBCUTANEOUS
  Administered 2022-08-25: 4 [IU] via SUBCUTANEOUS
  Administered 2022-08-25: 11 [IU] via SUBCUTANEOUS

## 2022-08-25 MED ORDER — INSULIN ASPART 100 UNIT/ML IJ SOLN: 0.0000 [IU] | Freq: Three times a day (TID) | INTRAMUSCULAR | Status: DC

## 2022-08-25 MED ORDER — HALOPERIDOL LACTATE 5 MG/ML IJ SOLN
5.0000 mg | Freq: Once | INTRAMUSCULAR | Status: AC
  Administered 2022-08-25: 5 mg via INTRAVENOUS

## 2022-08-25 MED ORDER — DIGOXIN 0.25 MG/ML IJ SOLN
0.2500 mg | Freq: Once | INTRAMUSCULAR | Status: AC
  Administered 2022-08-25: 0.25 mg via INTRAVENOUS
  Filled 2022-08-25: qty 1

## 2022-08-25 NOTE — ED Notes (Signed)
2 L O2 applied in CT
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
## 2022-08-25 NOTE — Plan of Care (Signed)
  Problem: Nutrition: Goal: Adequate nutrition will be maintained Outcome: Progressing   Problem: Elimination: Goal: Will not experience complications related to bowel motility Outcome: Progressing Goal: Will not experience complications related to urinary retention Outcome: Progressing   Problem: Education: Goal: Knowledge of General Education information will improve Description: Including pain rating scale, medication(s)/side effects and non-pharmacologic comfort measures Outcome: Not Progressing   Problem: Health Behavior/Discharge Planning: Goal: Ability to manage health-related needs will improve Outcome: Not Progressing   Problem: Activity: Goal: Risk for activity intolerance will decrease Outcome: Not Progressing   Problem: Coping: Goal: Level of anxiety will decrease Outcome: Not Progressing

## 2022-08-25 NOTE — ED Notes (Signed)
ORTHO, TRAUMA, AND ENT PAGED TO DR Burt Ek

## 2022-08-25 NOTE — Progress Notes (Signed)
   08/24/22 2336  Assess: MEWS Score  Temp 98.4 F (36.9 C)  BP (!) 82/57  MAP (mmHg) 66  Pulse Rate (!) 130  Resp 20  Level of Consciousness Alert  SpO2 97 %  O2 Device Nasal Cannula  Assess: MEWS Score  MEWS Temp 0  MEWS Systolic 1  MEWS Pulse 3  MEWS RR 0  MEWS LOC 0  MEWS Score 4  MEWS Score Color Red  Assess: if the MEWS score is Yellow or Red  Were vital signs taken at a resting state? Yes  Focused Assessment No change from prior assessment  Does the patient meet 2 or more of the SIRS criteria? No  MEWS guidelines implemented  Yes, red  Treat  MEWS Interventions Considered administering scheduled or prn medications/treatments as ordered  Take Vital Signs  Increase Vital Sign Frequency  Red: Q1hr x2, continue Q4hrs until patient remains green for 12hrs  Escalate  MEWS: Escalate Red: Discuss with charge nurse and notify provider. Consider notifying RRT. If remains red for 2 hours consider need for higher level of care  Notify: Charge Nurse/RN  Name of Charge Nurse/RN Notified Birney RN  Provider Notification  Provider Name/Title Dr. Sidney Ace  Date Provider Notified 08/24/22  Time Provider Notified 2339  Method of Notification Page  Notification Reason Change in status (HR 130s, BP 82/57, red mews. ETOH & cocaine withdrawal. s/p ativan. no IV fluids going.)  Provider response See new orders  Date of Provider Response 08/24/22  Time of Provider Response 2341  Notify: Rapid Response  Name of Rapid Response RN Notified Mindy RN  Date Rapid Response Notified 08/24/22  Time Rapid Response Notified [id number]  Assess: SIRS CRITERIA  SIRS Temperature  0  SIRS Pulse 1  SIRS Respirations  0  SIRS WBC 0  SIRS Score Sum  1
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
## 2022-08-25 NOTE — ED Notes (Signed)
Pt daughter, cherish would like to be updated on pt status [phone]

## 2022-08-25 NOTE — Plan of Care (Signed)
MRI brain personally reviewed. No acute DWI lesion seen. Minimal to miild chronic microvascular ischemic changes are seen. No structural finding suggestive of an epileptogenic focus is seen.  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  - Continue with Zonegran 100 mg po qhs.  - CIWA protocol - Drug cessation counseling - Continue thiamine and folic acid  - Delirium precautions  - Neurology will be available prn for questions going forward   Electronically signed: Dr. Kerney Elbe
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
## 2022-08-25 NOTE — Consult Note (Signed)
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 Cerebral volume within normal limits. Mild scattered patchy T2/FLAIR hyperintensity noted involving the periventricular white matter, nonspecific, but most likely related chronic microvascular ischemic disease. Changes are minimal for age. No abnormal foci of restricted diffusion to suggest acute or subacute ischemia. Gray-white matter differentiation maintained. No areas of chronic cortical infarction. No acute or chronic intracranial blood products. No mass lesion, midline shift or mass effect no hydrocephalus or extra-axial fluid collection. Pituitary gland and suprasellar region within normal limits. No abnormal enhancement. Vascular: Major intracranial vascular flow voids are maintained. Skull and upper cervical spine: Craniocervical junction within normal limits. Bone marrow signal intensity normal. No scalp soft tissue abnormality. Sinuses/Orbits: Globes and orbital soft tissues within normal limits. Paranasal sinuses are largely clear. Trace bilateral mastoid effusions noted, of doubtful significance. Visualized nasopharynx unremarkable. Other: None. IMPRESSION: 1. No acute intracranial abnormality. 2. Mild chronic microvascular ischemic disease  for age. Electronically Signed   By: BJeannine BogaM.D.   On: 08/24/2022 20:58   EEG adult  Result Date: 08/24/2022 KGreta Doom MD     08/24/2022  4:24 PM History: 57yo F with seizures and substance abuse Sedation: none Technique: This EEG was acquired with electrodes placed according to the International 10-20 electrode system (including Fp1, Fp2, F3, F4,  C3, C4, P3, P4, O1, O2, T3, T4, T5, T6, A1, A2, Fz, Cz, Pz). The following electrodes were missing or displaced: none. Background: The background consists of intermixed alpha and beta activities. There is a well defined posterior dominant rhythm of 9-10 Hz. Sleep is recorded with normal appearing structures.  She has a single sharp wave which is maximal in the right temporal region, F8 >T8 > P8.  This is seen with a wide field Photic stimulation: Physiologic driving is present EEG Abnormalities: 1) Right temporal sharp wave Clinical Interpretation: This EEG is suggestive of a potential area of epileptogenicity in the right temporal region. There was no seizure or seizure predisposition recorded on this study. Please note that lack of epileptiform activity on EEG does not preclude the possibility of epilepsy. Roland Rack, MD [facility] [phone] If 7pm- 7am, please page neurology on call as listed in [place].    Review of Systems Blood pressure (!) 151/86, pulse (!) 107, temperature 97.9 F (36.6 C), temperature source Axillary, resp. rate 19, height '5\' 2"'$  (1.575 m), weight 50.2 kg, last menstrual period 01/08/2011, SpO2 98 %. Physical Exam  Patient appears intoxicated.  She opens her eyes to voice and is pleasant but largely not cooperative with my exam Pupils equal round and reactive to light/no obvious entrapment/visual acuity grossly intact Pinna normal without mastoid tenderness or ecchymosis Neck without mass, adenopathy, or trauma Cannot assess neurologic status due to intoxication Mandible, midface,  zygomas, and periorbital regions stable Laceration over right zygoma  CT scan -I reviewed the radiology dictation of the CT scan noting multiple maxillary sinus fractures and a suspected right orbital floor fracture.  No other fractures were mentioned.  I reviewed the films and she appears to have a nondisplaced right ZMC fracture involving the right lateral buttress, right orbital floor, right zygoma, and right superior lateral orbital rim.  Assessment/Plan:  Right ZMC fracture - nondisplaced  This patient's right ZMC fracture does not require ORIF and should be treated conservatively.  The patient should observe a soft diet for 3 to 4 weeks.  There is no evidence of diplopia or entrapment and the orbital floor component of the fracture is minute and nondisplaced.  Please reconsult as needed.  Boyce Medici. 08/25/2022, 11:54 PM
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
## 2022-08-25 NOTE — Discharge Summary (Addendum)
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 A negative result must be combined with clinical observations, patient history, and epidemiological information. The expected result is Negative.  Fact Sheet for Patients:  EntrepreneurPulse.com.au  Fact Sheet for Healthcare Providers:  IncredibleEmployment.be  This test is no t yet approved or cleared by the [place] FDA and  has been authorized for detection and/or diagnosis of SARS-CoV-2 by FDA under an Emergency Use Authorization (EUA). This EUA will remain  in effect (meaning this test can be used) for the duration of the COVID-19 declaration under Section 564(b)(1) of the Act, 21 U.S.C.section 360bbb-3(b)(1), unless the authorization is terminated  or revoked sooner.       Influenza A by PCR NEGATIVE NEGATIVE Final   Influenza B by PCR NEGATIVE NEGATIVE Final    Comment: (NOTE) The Xpert Xpress SARS-CoV-2/FLU/RSV plus assay is intended as an aid in the diagnosis of influenza from Nasopharyngeal swab specimens and should not be used as a sole basis for treatment. Nasal washings and aspirates are unacceptable  for Xpert Xpress SARS-CoV-2/FLU/RSV testing.  Fact Sheet for Patients: EntrepreneurPulse.com.au  Fact Sheet for Healthcare Providers: IncredibleEmployment.be  This test is not yet approved or cleared by the [place] FDA and has been authorized for detection and/or diagnosis of SARS-CoV-2 by FDA under an Emergency Use Authorization (EUA). This EUA will remain in effect (meaning this test can be used) for the duration of the COVID-19 declaration under Section 564(b)(1) of the Act, 21 U.S.C. section 360bbb-3(b)(1), unless the authorization is terminated or revoked.     Resp Syncytial Virus by PCR NEGATIVE NEGATIVE Final    Comment: (NOTE) Fact Sheet for Patients: EntrepreneurPulse.com.au  Fact Sheet for Healthcare Providers: IncredibleEmployment.be  This test is not yet approved or cleared by the [place] FDA and has been authorized for detection and/or diagnosis of SARS-CoV-2 by FDA under an Emergency Use Authorization (EUA). This EUA will remain in effect (meaning this test can be used) for the duration of the COVID-19 declaration under Section 564(b)(1) of the Act, 21 U.S.C. section 360bbb-3(b)(1), unless the authorization is terminated or revoked.  Performed at [facility], [place] [address] [place], [place] [address]   Urine Culture     Status: Abnormal   Collection Time: 07/05/22 10:29 AM   Specimen: Urine, Clean Catch  Result Value Ref Range Status   Specimen Description URINE, CLEAN CATCH  Final   Special Requests   Final    NONE Performed at [facility], [place] [address], [place], [place] [address]    Culture MULTIPLE SPECIES PRESENT, SUGGEST RECOLLECTION (A)  Final   Report Status 07/07/2022 FINAL  Final  Culture, blood (Routine X 2) w Reflex to ID Panel     Status: None   Collection Time: 07/07/22  8:45 AM   Specimen: BLOOD  Result Value Ref Range Status   Specimen  Description BLOOD RIGHT ANTECUBITAL  Final   Special Requests IN PEDIATRIC BOTTLE Blood Culture adequate volume  Final   Culture   Final    NO GROWTH 5 DAYS Performed at [facility], [place] [address], [place], [place] [address]    Report Status 07/12/2022 FINAL  Final  Culture, blood (Routine X 2) w Reflex to ID Panel     Status: None   Collection Time: 07/07/22  8:45 AM   Specimen: BLOOD LEFT HAND  Result Value Ref Range Status   Specimen  Description BLOOD LEFT HAND  Final   Special Requests   Final    BOTTLES DRAWN AEROBIC AND ANAEROBIC Blood Culture results may not be optimal due to an inadequate volume of blood received in culture bottles   Culture   Final    NO GROWTH 5 DAYS Performed at [facility], [place] [address] [place], [place] [address]    Report Status 07/12/2022 FINAL  Final  Resp panel by RT-PCR (RSV, Flu A&B, Covid) Anterior Nasal Swab     Status: None   Collection Time: 07/09/22 12:15 PM   Specimen: Anterior Nasal Swab  Result Value Ref Range Status   SARS Coronavirus 2 by RT PCR NEGATIVE NEGATIVE Final    Comment: (NOTE) SARS-CoV-2 target nucleic acids are NOT DETECTED.  The SARS-CoV-2 RNA is generally detectable in upper respiratory specimens during the acute phase of infection. The lowest concentration of SARS-CoV-2 viral copies this assay can detect is 138 copies/mL. A negative result does not preclude SARS-Cov-2 infection and should not be used as the sole basis for treatment or other patient management decisions. A negative result may occur with  improper specimen collection/handling, submission of specimen other than nasopharyngeal swab, presence of viral mutation(s) within the areas targeted by this assay, and inadequate number of viral copies(<138 copies/mL). A negative result must be combined with clinical observations, patient history, and epidemiological information. The expected result is Negative.  Fact Sheet for Patients:   EntrepreneurPulse.com.au  Fact Sheet for Healthcare Providers:  IncredibleEmployment.be  This test is no t yet approved or cleared by the [place] FDA and  has been authorized for detection and/or diagnosis of SARS-CoV-2 by FDA under an Emergency Use Authorization (EUA). This EUA will remain  in effect (meaning this test can be used) for the duration of the COVID-19 declaration under Section 564(b)(1) of the Act, 21 U.S.C.section 360bbb-3(b)(1), unless the authorization is terminated  or revoked sooner.       Influenza A by PCR NEGATIVE NEGATIVE Final   Influenza B by PCR NEGATIVE NEGATIVE Final    Comment: (NOTE) The Xpert Xpress SARS-CoV-2/FLU/RSV plus assay is intended as an aid in the diagnosis of influenza from Nasopharyngeal swab specimens and should not be used as a sole basis for treatment. Nasal washings and aspirates are unacceptable for Xpert Xpress SARS-CoV-2/FLU/RSV testing.  Fact Sheet for Patients: EntrepreneurPulse.com.au  Fact Sheet for Healthcare Providers: IncredibleEmployment.be  This test is not yet approved or cleared by the [place] FDA and has been authorized for detection and/or diagnosis of SARS-CoV-2 by FDA under an Emergency Use Authorization (EUA). This EUA will remain in effect (meaning this test can be used) for the duration of the COVID-19 declaration under Section 564(b)(1) of the Act, 21 U.S.C. section 360bbb-3(b)(1), unless the authorization is terminated or revoked.     Resp Syncytial Virus by PCR NEGATIVE NEGATIVE Final    Comment: (NOTE) Fact Sheet for Patients: EntrepreneurPulse.com.au  Fact Sheet for Healthcare Providers: IncredibleEmployment.be  This test is not yet approved or cleared by the [place] FDA and has been authorized for detection and/or diagnosis of SARS-CoV-2 by FDA under an Emergency Use  Authorization (EUA). This EUA will remain in effect (meaning this test can be used) for the duration of the COVID-19 declaration under Section 564(b)(1) of the Act, 21 U.S.C. section 360bbb-3(b)(1), unless the authorization is terminated or revoked.  Performed at [facility], [place] [address], [place], [place] [address]     Labs: CBC: Recent  Labs  Lab 08/23/22 1443 08/23/22 1452 08/24/22 1207  WBC 6.2  --  5.7  NEUTROABS 4.5  --  3.4  HGB 13.7 15.6* 13.3  HCT 41.1 46.0 41.0  MCV 86.9  --  87.6  PLT 296  --  0000000   Basic Metabolic Panel: Recent Labs  Lab 08/23/22 1443 08/23/22 1452 08/24/22 1207  NA 134* 134* 138  K 5.1 4.7 4.9  CL 97* 100 101  CO2 21*  --  24  GLUCOSE 196* 204* 128*  BUN '7 7 8  '$ CREATININE 0.62 0.50 0.51  CALCIUM 9.3  --  8.9  MG  --   --  2.2   Liver Function Tests: Recent Labs  Lab 08/23/22 1443 08/24/22 1207  AST 33 16  ALT 17 12  ALKPHOS 130* 124  BILITOT 2.2* 1.9*  PROT 7.7 6.8  ALBUMIN 3.7 3.2*   CBG: Recent Labs  Lab 08/25/22 0153 08/25/22 0615 08/25/22 1008 08/25/22 1148 08/25/22 1541  GLUCAP 276* 202* 282* 199* 164*    Discharge time spent: greater than 30 minutes.  Signed: Murray Hodgkins, MD [facility] 08/25/2022

## 2022-08-25 NOTE — ED Notes (Signed)
Date and time results received: 08/25/22 2341 (use smartphrase ".now" to insert current time)  Test: lactic acid Critical Value: 2.8  Name of Provider Notified: [email or web address]  Orders Received? Or Actions Taken?:

## 2022-08-26 ENCOUNTER — Emergency Department (HOSPITAL_COMMUNITY): Payer: 59

## 2022-08-26 DIAGNOSIS — Y905 Blood alcohol level of 100-119 mg/100 ml: Secondary | ICD-10-CM | POA: Diagnosis not present

## 2022-08-26 DIAGNOSIS — S82224A Nondisplaced transverse fracture of shaft of right tibia, initial encounter for closed fracture: Secondary | ICD-10-CM | POA: Diagnosis not present

## 2022-08-26 DIAGNOSIS — S99921A Unspecified injury of right foot, initial encounter: Secondary | ICD-10-CM | POA: Diagnosis not present

## 2022-08-26 DIAGNOSIS — F319 Bipolar disorder, unspecified: Secondary | ICD-10-CM | POA: Diagnosis present

## 2022-08-26 DIAGNOSIS — E8809 Other disorders of plasma-protein metabolism, not elsewhere classified: Secondary | ICD-10-CM | POA: Diagnosis present

## 2022-08-26 DIAGNOSIS — S82141A Displaced bicondylar fracture of right tibia, initial encounter for closed fracture: Secondary | ICD-10-CM | POA: Diagnosis not present

## 2022-08-26 DIAGNOSIS — S0231XA Fracture of orbital floor, right side, initial encounter for closed fracture: Secondary | ICD-10-CM | POA: Diagnosis not present

## 2022-08-26 DIAGNOSIS — S82191A Other fracture of upper end of right tibia, initial encounter for closed fracture: Secondary | ICD-10-CM | POA: Diagnosis present

## 2022-08-26 DIAGNOSIS — S32019A Unspecified fracture of first lumbar vertebra, initial encounter for closed fracture: Secondary | ICD-10-CM | POA: Diagnosis not present

## 2022-08-26 DIAGNOSIS — F10129 Alcohol abuse with intoxication, unspecified: Secondary | ICD-10-CM | POA: Diagnosis present

## 2022-08-26 DIAGNOSIS — Z96641 Presence of right artificial hip joint: Secondary | ICD-10-CM | POA: Diagnosis not present

## 2022-08-26 DIAGNOSIS — S0240CA Maxillary fracture, right side, initial encounter for closed fracture: Secondary | ICD-10-CM | POA: Diagnosis not present

## 2022-08-26 DIAGNOSIS — M25561 Pain in right knee: Secondary | ICD-10-CM | POA: Diagnosis not present

## 2022-08-26 DIAGNOSIS — E1165 Type 2 diabetes mellitus with hyperglycemia: Secondary | ICD-10-CM | POA: Diagnosis present

## 2022-08-26 DIAGNOSIS — R569 Unspecified convulsions: Secondary | ICD-10-CM | POA: Diagnosis not present

## 2022-08-26 DIAGNOSIS — S0281XA Fracture of other specified skull and facial bones, right side, initial encounter for closed fracture: Secondary | ICD-10-CM | POA: Diagnosis not present

## 2022-08-26 DIAGNOSIS — Z23 Encounter for immunization: Secondary | ICD-10-CM | POA: Diagnosis not present

## 2022-08-26 DIAGNOSIS — S728X1A Other fracture of right femur, initial encounter for closed fracture: Secondary | ICD-10-CM | POA: Diagnosis not present

## 2022-08-26 DIAGNOSIS — S42002A Fracture of unspecified part of left clavicle, initial encounter for closed fracture: Secondary | ICD-10-CM | POA: Diagnosis not present

## 2022-08-26 DIAGNOSIS — G40409 Other generalized epilepsy and epileptic syndromes, not intractable, without status epilepticus: Secondary | ICD-10-CM | POA: Diagnosis not present

## 2022-08-26 DIAGNOSIS — S42252D Displaced fracture of greater tuberosity of left humerus, subsequent encounter for fracture with routine healing: Secondary | ICD-10-CM | POA: Diagnosis not present

## 2022-08-26 DIAGNOSIS — Y92410 Unspecified street and highway as the place of occurrence of the external cause: Secondary | ICD-10-CM | POA: Diagnosis not present

## 2022-08-26 DIAGNOSIS — S42351A Displaced comminuted fracture of shaft of humerus, right arm, initial encounter for closed fracture: Secondary | ICD-10-CM | POA: Diagnosis not present

## 2022-08-26 DIAGNOSIS — Y9301 Activity, walking, marching and hiking: Secondary | ICD-10-CM | POA: Diagnosis not present

## 2022-08-26 DIAGNOSIS — M79603 Pain in arm, unspecified: Secondary | ICD-10-CM | POA: Diagnosis not present

## 2022-08-26 DIAGNOSIS — M25551 Pain in right hip: Secondary | ICD-10-CM | POA: Diagnosis present

## 2022-08-26 DIAGNOSIS — E119 Type 2 diabetes mellitus without complications: Secondary | ICD-10-CM | POA: Diagnosis not present

## 2022-08-26 DIAGNOSIS — E039 Hypothyroidism, unspecified: Secondary | ICD-10-CM | POA: Diagnosis not present

## 2022-08-26 DIAGNOSIS — F259 Schizoaffective disorder, unspecified: Secondary | ICD-10-CM | POA: Diagnosis not present

## 2022-08-26 DIAGNOSIS — S0280XA Fracture of other specified skull and facial bones, unspecified side, initial encounter for closed fracture: Secondary | ICD-10-CM | POA: Diagnosis not present

## 2022-08-26 DIAGNOSIS — S72011A Unspecified intracapsular fracture of right femur, initial encounter for closed fracture: Secondary | ICD-10-CM | POA: Diagnosis not present

## 2022-08-26 DIAGNOSIS — S72001A Fracture of unspecified part of neck of right femur, initial encounter for closed fracture: Secondary | ICD-10-CM | POA: Diagnosis not present

## 2022-08-26 DIAGNOSIS — F17210 Nicotine dependence, cigarettes, uncomplicated: Secondary | ICD-10-CM | POA: Diagnosis not present

## 2022-08-26 DIAGNOSIS — I10 Essential (primary) hypertension: Secondary | ICD-10-CM | POA: Diagnosis not present

## 2022-08-26 DIAGNOSIS — E871 Hypo-osmolality and hyponatremia: Secondary | ICD-10-CM | POA: Diagnosis not present

## 2022-08-26 DIAGNOSIS — F17200 Nicotine dependence, unspecified, uncomplicated: Secondary | ICD-10-CM | POA: Diagnosis not present

## 2022-08-26 DIAGNOSIS — S0240EA Zygomatic fracture, right side, initial encounter for closed fracture: Secondary | ICD-10-CM | POA: Diagnosis not present

## 2022-08-26 DIAGNOSIS — Z9151 Personal history of suicidal behavior: Secondary | ICD-10-CM | POA: Diagnosis not present

## 2022-08-26 DIAGNOSIS — Z5321 Procedure and treatment not carried out due to patient leaving prior to being seen by health care provider: Secondary | ICD-10-CM | POA: Diagnosis not present

## 2022-08-26 DIAGNOSIS — E1140 Type 2 diabetes mellitus with diabetic neuropathy, unspecified: Secondary | ICD-10-CM | POA: Diagnosis not present

## 2022-08-26 DIAGNOSIS — T1490XA Injury, unspecified, initial encounter: Secondary | ICD-10-CM | POA: Diagnosis not present

## 2022-08-26 DIAGNOSIS — D638 Anemia in other chronic diseases classified elsewhere: Secondary | ICD-10-CM | POA: Diagnosis not present

## 2022-08-26 DIAGNOSIS — E785 Hyperlipidemia, unspecified: Secondary | ICD-10-CM | POA: Diagnosis present

## 2022-08-26 DIAGNOSIS — D62 Acute posthemorrhagic anemia: Secondary | ICD-10-CM | POA: Diagnosis not present

## 2022-08-26 DIAGNOSIS — S72041A Displaced fracture of base of neck of right femur, initial encounter for closed fracture: Secondary | ICD-10-CM | POA: Diagnosis not present

## 2022-08-26 DIAGNOSIS — I4891 Unspecified atrial fibrillation: Secondary | ICD-10-CM | POA: Diagnosis not present

## 2022-08-26 DIAGNOSIS — J449 Chronic obstructive pulmonary disease, unspecified: Secondary | ICD-10-CM | POA: Diagnosis not present

## 2022-08-26 DIAGNOSIS — F1410 Cocaine abuse, uncomplicated: Secondary | ICD-10-CM | POA: Diagnosis present

## 2022-08-26 DIAGNOSIS — Z471 Aftercare following joint replacement surgery: Secondary | ICD-10-CM | POA: Diagnosis not present

## 2022-08-26 DIAGNOSIS — S01411A Laceration without foreign body of right cheek and temporomandibular area, initial encounter: Secondary | ICD-10-CM | POA: Diagnosis present

## 2022-08-26 DIAGNOSIS — S42202A Unspecified fracture of upper end of left humerus, initial encounter for closed fracture: Secondary | ICD-10-CM | POA: Diagnosis not present

## 2022-08-26 DIAGNOSIS — S42032A Displaced fracture of lateral end of left clavicle, initial encounter for closed fracture: Secondary | ICD-10-CM | POA: Diagnosis present

## 2022-08-26 DIAGNOSIS — S99911A Unspecified injury of right ankle, initial encounter: Secondary | ICD-10-CM | POA: Diagnosis not present

## 2022-08-26 DIAGNOSIS — X58XXXD Exposure to other specified factors, subsequent encounter: Secondary | ICD-10-CM | POA: Diagnosis not present

## 2022-08-26 LAB — FOLATE: Folate: 27 ng/mL (ref >5.9)

## 2022-08-26 LAB — RETICULOCYTES
Immature Retic Fract: 15.1 % (ref 2.3–15.9)
RBC.: 3.63 MIL/uL — ABNORMAL LOW (ref 3.87–5.11)
Retic Count, Absolute: 54.4 10*3/uL (ref 19.0–186.0)
Retic Ct Pct: 1.5 % (ref 0.4–3.1)

## 2022-08-26 LAB — VITAMIN B12: Vitamin B-12: 234 pg/mL (ref 180–914)

## 2022-08-26 LAB — IRON AND TIBC
Iron: 111 ug/dL (ref 28–170)
Saturation Ratios: 25 % (ref 10.4–31.8)
TIBC: 454 ug/dL — ABNORMAL HIGH (ref 250–450)
UIBC: 343 ug/dL

## 2022-08-26 LAB — FERRITIN: Ferritin: 91 ng/mL (ref 11–307)

## 2022-08-26 MED ORDER — METHOCARBAMOL 1000 MG/10ML IJ SOLN
500.0000 mg | Freq: Four times a day (QID) | INTRAVENOUS | Status: DC | PRN
  Administered 2022-08-28 – 2022-08-31 (×2): 500 mg via INTRAVENOUS
  Filled 2022-08-26 (×2): qty 5
  Filled 2022-08-26: qty 500

## 2022-08-26 MED ORDER — HYDROMORPHONE HCL 1 MG/ML IJ SOLN
1.0000 mg | Freq: Once | INTRAMUSCULAR | Status: AC
  Administered 2022-08-26: 1 mg via INTRAVENOUS
  Filled 2022-08-26: qty 1

## 2022-08-26 MED ORDER — ZONISAMIDE 100 MG PO CAPS
100.0000 mg | ORAL_CAPSULE | Freq: Every day | ORAL | Status: DC
  Administered 2022-08-26 – 2022-09-01 (×7): 100 mg via ORAL
  Filled 2022-08-26 (×8): qty 1

## 2022-08-26 MED ORDER — ENOXAPARIN SODIUM 30 MG/0.3ML IJ SOSY: 30.0000 mg | PREFILLED_SYRINGE | Freq: Two times a day (BID) | INTRAMUSCULAR | Status: DC

## 2022-08-26 MED ORDER — LORAZEPAM 1 MG PO TABS
1.0000 mg | ORAL_TABLET | ORAL | Status: DC | PRN
  Administered 2022-08-26: 2 mg via ORAL
  Filled 2022-08-26: qty 2

## 2022-08-26 MED ORDER — FOLIC ACID 1 MG PO TABS
1.0000 mg | ORAL_TABLET | Freq: Every day | ORAL | Status: DC
  Administered 2022-08-28 – 2022-09-10 (×14): 1 mg via ORAL
  Filled 2022-08-26 (×14): qty 1

## 2022-08-26 MED ORDER — MORPHINE SULFATE (PF) 2 MG/ML IV SOLN
1.0000 mg | INTRAVENOUS | Status: DC | PRN
  Administered 2022-08-26: 1 mg via INTRAVENOUS
  Administered 2022-08-26 – 2022-09-01 (×23): 2 mg via INTRAVENOUS
  Filled 2022-08-26 (×24): qty 1

## 2022-08-26 MED ORDER — ADULT MULTIVITAMIN W/MINERALS CH
1.0000 | ORAL_TABLET | Freq: Every day | ORAL | Status: DC
  Administered 2022-08-28 – 2022-09-10 (×14): 1 via ORAL
  Filled 2022-08-26 (×14): qty 1

## 2022-08-26 MED ORDER — THIAMINE HCL 100 MG/ML IJ SOLN: 100.0000 mg | Freq: Every day | INTRAMUSCULAR | Status: DC

## 2022-08-26 MED ORDER — LORAZEPAM 2 MG/ML IJ SOLN
1.0000 mg | INTRAMUSCULAR | Status: AC | PRN
  Administered 2022-08-26: 1 mg via INTRAVENOUS
  Administered 2022-08-28: 2 mg via INTRAVENOUS
  Administered 2022-08-28 (×3): 1 mg via INTRAVENOUS
  Administered 2022-08-29 (×2): 2 mg via INTRAVENOUS
  Administered 2022-08-29 (×3): 1 mg via INTRAVENOUS
  Filled 2022-08-26 (×10): qty 1

## 2022-08-26 MED ORDER — POTASSIUM CHLORIDE IN NACL 20-0.9 MEQ/L-% IV SOLN
INTRAVENOUS | Status: DC
  Filled 2022-08-26 (×6): qty 1000

## 2022-08-26 MED ORDER — LORAZEPAM 1 MG PO TABS
1.0000 mg | ORAL_TABLET | ORAL | Status: AC | PRN
  Administered 2022-08-29: 1 mg via ORAL
  Filled 2022-08-26: qty 1

## 2022-08-26 MED ORDER — THIAMINE MONONITRATE 100 MG PO TABS
100.0000 mg | ORAL_TABLET | Freq: Every day | ORAL | Status: DC
  Administered 2022-08-28 – 2022-09-10 (×14): 100 mg via ORAL
  Filled 2022-08-26 (×14): qty 1

## 2022-08-26 MED ORDER — PANTOPRAZOLE SODIUM 40 MG IV SOLR: 40.0000 mg | Freq: Every day | INTRAVENOUS | Status: DC

## 2022-08-26 MED ORDER — PANTOPRAZOLE SODIUM 40 MG PO TBEC
40.0000 mg | DELAYED_RELEASE_TABLET | Freq: Every day | ORAL | Status: DC
  Administered 2022-08-28 – 2022-09-10 (×14): 40 mg via ORAL
  Filled 2022-08-26 (×14): qty 1
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
## 2022-08-26 NOTE — Plan of Care (Signed)

## 2022-08-26 NOTE — ED Notes (Signed)
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 EXAM: PORTABLE CHEST 1 VIEW COMPARISON:  08/23/2022 FINDINGS: Lungs are clear.  No pleural effusion or pneumothorax. The heart is normal in size. IMPRESSION: No evidence of acute cardiopulmonary disease. Electronically Signed   By: Julian Hy M.D.   On: 08/25/2022 22:14   MR BRAIN W WO CONTRAST  Result Date: 08/24/2022 CLINICAL DATA:  Initial evaluation for altered mental status. EXAM: MRI HEAD WITHOUT AND WITH CONTRAST TECHNIQUE: Multiplanar, multiecho pulse sequences of the brain and surrounding structures were obtained without and with intravenous contrast. CONTRAST:  4.58m GADAVIST GADOBUTROL 1 MMOL/ML IV SOLN COMPARISON:  Prior CT from 08/23/2022. FINDINGS: Brain: Cerebral volume within normal limits. Mild scattered patchy T2/FLAIR hyperintensity noted involving the periventricular white matter, nonspecific, but most likely related chronic microvascular ischemic disease. Changes are minimal for age. No abnormal foci of restricted diffusion to suggest acute or subacute ischemia. Gray-white matter differentiation maintained. No areas of chronic cortical infarction. No  acute or chronic intracranial blood products. No mass lesion, midline shift or mass effect no hydrocephalus or extra-axial fluid collection. Pituitary gland and suprasellar region within normal limits. No abnormal enhancement. Vascular: Major intracranial vascular  flow voids are maintained. Skull and upper cervical spine: Craniocervical junction within normal limits. Bone marrow signal intensity normal. No scalp soft tissue abnormality. Sinuses/Orbits: Globes and orbital soft tissues within normal limits. Paranasal sinuses are largely clear. Trace bilateral mastoid effusions noted, of doubtful significance. Visualized nasopharynx unremarkable. Other: None. IMPRESSION: 1. No acute intracranial abnormality. 2. Mild chronic microvascular ischemic disease for age. Electronically Signed   By: Jeannine Boga M.D.   On: 08/24/2022 20:58   EEG adult  Result Date: 08/24/2022 Greta Doom, MD     08/24/2022  4:24 PM History: 57 yo F with seizures and substance abuse Sedation: none Technique: This EEG was acquired with electrodes placed according to the International 10-20 electrode system (including Fp1, Fp2, F3, F4, C3, C4, P3, P4, O1, O2, T3, T4, T5, T6, A1, A2, Fz, Cz, Pz). The following electrodes were missing or displaced: none. Background: The background consists of intermixed alpha and beta activities. There is a well defined posterior dominant rhythm of 9-10 Hz. Sleep is recorded with normal appearing structures.  She has a single sharp wave which is maximal in the right temporal region, F8 >T8 > P8.  This is seen with a wide field Photic stimulation: Physiologic driving is present EEG Abnormalities: 1) Right temporal sharp wave Clinical Interpretation: This EEG is suggestive of a potential area of epileptogenicity in the right temporal region. There was no seizure or seizure predisposition recorded on this study. Please note that lack of epileptiform activity on EEG does not preclude the possibility  of epilepsy. Roland Rack, MD [facility] [phone] If 7pm- 7am, please page neurology on call as listed in [place].    Pending Labs Unresulted Labs (From admission, onward)     Start     Ordered   09/02/22 0500  Creatinine, serum  (enoxaparin (LOVENOX)    CrCl >/= 30 with major trauma, spinal cord injury, or selected orthopedic surgery)  Weekly,   R     Comments: while on enoxaparin therapy.    08/26/22 0413   08/27/22 0500  Comprehensive metabolic panel  Tomorrow morning,   R        08/26/22 0413   08/27/22 0500  CBC  Tomorrow morning,   R        08/26/22 0413   08/26/22 0414  Type and screen [place]  Once,   R       Comments: [place]    08/26/22 0413   08/26/22 0414  Vitamin B1  Once,   R        08/26/22 0413   08/26/22 0414  Vitamin B12  (Anemia Panel (PNL))  Once,   R        08/26/22 0413   08/26/22 0414  Folate  (Anemia Panel (PNL))  Once,   R        08/26/22 0413   08/26/22 0414  Iron and TIBC  (Anemia Panel (PNL))  Once,   R        08/26/22 0413   08/26/22 0414  Ferritin  (Anemia Panel (PNL))  Once,   R        08/26/22 0413   08/26/22 0414  Reticulocytes  (Anemia Panel (PNL))  Once,   R        08/26/22 0413   08/25/22 2155  Urinalysis, Routine w reflex microscopic -Urine, Catheterized  (Trauma Panel)  Once,   URGENT       Question:  Specimen Source  Answer:  Urine, Catheterized   08/25/22 2156   08/25/22 2155  Urine rapid drug screen (hosp performed)  ONCE - STAT,   STAT        08/25/22 2156            Vitals/Pain Today's Vitals   08/26/22 0315 08/26/22 0345 08/26/22 0422 08/26/22 0430  BP: (!) 141/68 (!) 153/74 (!) 153/74 (!) 153/82  Pulse: (!) 107 (!) 108 (!) 110 (!) 106  Resp: (!) 22 15    Temp:      TempSrc:      SpO2: 96% 97%  95%  Weight:      Height:      PainSc:        Isolation Precautions No active isolations  Medications Medications  zonisamide (ZONEGRAN) capsule 100 mg (has  no administration in time range)  multivitamin with minerals tablet 1 tablet (has no administration in time range)  LORazepam (ATIVAN) tablet 1-4 mg (2 mg Oral Given 08/26/22 0425)  thiamine (VITAMIN B1) tablet 100 mg (has no administration in time range)    Or  thiamine (VITAMIN B1) injection 100 mg (has no administration in time range)  folic acid (FOLVITE) tablet 1 mg (has no administration in time range)  enoxaparin (LOVENOX) injection 30 mg (has no administration in time range)  0.9 % NaCl with KCl 20 mEq/ L  infusion (has no administration in time range)  morphine (PF) 2 MG/ML injection 1-2 mg (2 mg Intravenous Given 08/26/22 0415)  pantoprazole (PROTONIX) EC tablet 40 mg (has no administration in time range)    Or  pantoprazole (PROTONIX) injection 40 mg (has no administration in time range)  fentaNYL (SUBLIMAZE) injection 100 mcg (100 mcg Intravenous Given 08/25/22 2252)  Tdap (BOOSTRIX) injection 0.5 mL (0.5 mLs Intramuscular Given 08/25/22 2253)  fentaNYL (SUBLIMAZE) 100 MCG/2ML injection (100 mcg  Given 08/25/22 2150)  haloperidol lactate (HALDOL) injection 5 mg (5 mg Intravenous Given 08/25/22 2212)  iohexol (OMNIPAQUE) 350 MG/ML injection 75 mL (75 mLs Intravenous Contrast Given 08/25/22 2237)  lidocaine-EPINEPHrine-tetracaine (LET) topical gel (3 mLs Topical Given 08/25/22 2340)  HYDROmorphone (DILAUDID) injection 1 mg (1 mg Intravenous Given 08/26/22 0040)    Mobility non-ambulatory     Focused Assessments Neuro Assessment Handoff:  Swallow screen pass? Yes          Neuro Assessment:   Neuro Checks:      Has TPA been given? No If patient is a Neuro Trauma and patient is going to OR before floor call report to [place] nurse: [phone] or [phone]   R Recommendations: See Admitting Provider Note  Report given to:   Additional Notes: pt is on 2L [place]. Pt has on purewick.

## 2022-08-26 NOTE — ED Notes (Signed)
C-collar removed per [place] MD

## 2022-08-26 NOTE — Progress Notes (Signed)
Orthopedic Tech Progress Note Patient Details:  [name] 05-05-1966 [id number] Applied layer of webril and ace wrap for compression under knee immobilizer.  Ortho Devices Type of Ortho Device: Knee Immobilizer Ortho Device/Splint Location: RLE Ortho Device/Splint Interventions: Ordered, Application, Adjustment   Post Interventions Patient Tolerated: Well  [name] A [name] 08/26/2022, 11:40 AM

## 2022-08-26 NOTE — TOC CAGE-AID Note (Signed)
Transition of Care [facility]) - CAGE-AID Screening   Patient Details  Name: [name] MRN: [id number] Date of Birth: Aug 15, 1965  [facility] CM/SW Contact:    Army Melia, RN Phone [phone] 08/26/2022, 8:57 PM   CAGE-AID Screening:    Have You Ever Felt You Ought to Cut Down on Your Drinking or Drug Use?: Yes Have People Annoyed You By Critizing Your Drinking Or Drug Use?: Yes Have You Felt Bad Or Guilty About Your Drinking Or Drug Use?: Yes Have You Ever Had a Drink or Used Drugs First Thing In The Morning to Steady Your Nerves or to Get Rid of a Hangover?: Yes CAGE-AID Score: 4  Substance Abuse Education Offered: Yes (toc consult already in place for resources)
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
## 2022-08-27 ENCOUNTER — Inpatient Hospital Stay (HOSPITAL_COMMUNITY): Payer: 59

## 2022-08-27 ENCOUNTER — Inpatient Hospital Stay (HOSPITAL_COMMUNITY): Payer: 59 | Admitting: Certified Registered Nurse Anesthetist

## 2022-08-27 ENCOUNTER — Other Ambulatory Visit: Payer: Self-pay

## 2022-08-27 ENCOUNTER — Encounter (HOSPITAL_COMMUNITY): Admission: EM | Disposition: A | Discharge status: Home / Self Care | Payer: Self-pay | Source: Home / Self Care

## 2022-08-27 ENCOUNTER — Encounter (HOSPITAL_COMMUNITY): Payer: Self-pay

## 2022-08-27 DIAGNOSIS — F17210 Nicotine dependence, cigarettes, uncomplicated: Secondary | ICD-10-CM | POA: Diagnosis not present

## 2022-08-27 DIAGNOSIS — J449 Chronic obstructive pulmonary disease, unspecified: Secondary | ICD-10-CM | POA: Diagnosis not present

## 2022-08-27 DIAGNOSIS — S72001A Fracture of unspecified part of neck of right femur, initial encounter for closed fracture: Secondary | ICD-10-CM

## 2022-08-27 DIAGNOSIS — D638 Anemia in other chronic diseases classified elsewhere: Secondary | ICD-10-CM

## 2022-08-27 DIAGNOSIS — E039 Hypothyroidism, unspecified: Secondary | ICD-10-CM

## 2022-08-27 DIAGNOSIS — I10 Essential (primary) hypertension: Secondary | ICD-10-CM | POA: Diagnosis not present

## 2022-08-27 HISTORY — PX: TOTAL HIP ARTHROPLASTY: SHX124

## 2022-08-27 LAB — CBC
HCT: 33.3 % — ABNORMAL LOW (ref 36.0–46.0)
Hemoglobin: 10.9 g/dL — ABNORMAL LOW (ref 12.0–15.0)
MCH: 27.9 pg (ref 26.0–34.0)
MCHC: 32.7 g/dL (ref 30.0–36.0)
MCV: 85.4 fL (ref 80.0–100.0)
Platelets: 176 10*3/uL (ref 150–400)
RBC: 3.9 MIL/uL (ref 3.87–5.11)
RDW: 15.1 % (ref 11.5–15.5)
WBC: 7.4 10*3/uL (ref 4.0–10.5)
nRBC: 0 % (ref 0.0–0.2)

## 2022-08-27 LAB — COMPREHENSIVE METABOLIC PANEL
ALT: 26 U/L (ref 0–44)
AST: 22 U/L (ref 15–41)
Albumin: 3.1 g/dL — ABNORMAL LOW (ref 3.5–5.0)
Alkaline Phosphatase: 101 U/L (ref 38–126)
Anion gap: 10 (ref 5–15)
BUN: 5 mg/dL — ABNORMAL LOW (ref 6–20)
CO2: 22 mmol/L (ref 22–32)
Calcium: 8.7 mg/dL — ABNORMAL LOW (ref 8.9–10.3)
Chloride: 100 mmol/L (ref 98–111)
Creatinine, Ser: 0.47 mg/dL (ref 0.44–1.00)
GFR, Estimated: >60 mL/min (ref >60)
Glucose, Bld: 196 mg/dL — ABNORMAL HIGH (ref 70–99)
Potassium: 3.8 mmol/L (ref 3.5–5.1)
Sodium: 132 mmol/L — ABNORMAL LOW (ref 135–145)
Total Bilirubin: 1.7 mg/dL — ABNORMAL HIGH (ref 0.3–1.2)
Total Protein: 6.5 g/dL (ref 6.5–8.1)

## 2022-08-27 LAB — GLUCOSE, CAPILLARY
Glucose-Capillary: 227 mg/dL — ABNORMAL HIGH (ref 70–99)
Glucose-Capillary: 116 mg/dL — ABNORMAL HIGH (ref 70–99)
Glucose-Capillary: 161 mg/dL — ABNORMAL HIGH (ref 70–99)
Glucose-Capillary: 327 mg/dL — ABNORMAL HIGH (ref 70–99)
Glucose-Capillary: 215 mg/dL — ABNORMAL HIGH (ref 70–99)

## 2022-08-27 LAB — SURGICAL PCR SCREEN
MRSA, PCR: POSITIVE — AB
Staphylococcus aureus: POSITIVE — AB

## 2022-08-27 SURGERY — ARTHROPLASTY, HIP, TOTAL,POSTERIOR APPROACH
Anesthesia: General | Site: Hip | Laterality: Right

## 2022-08-27 MED ORDER — ACETAMINOPHEN 500 MG PO TABS
1000.0000 mg | ORAL_TABLET | Freq: Once | ORAL | Status: AC
  Administered 2022-08-27: 1000 mg via ORAL
  Filled 2022-08-27: qty 2

## 2022-08-27 MED ORDER — SODIUM CHLORIDE 0.9 % IR SOLN
Status: DC | PRN
  Administered 2022-08-27: 3000 mL

## 2022-08-27 MED ORDER — LIDOCAINE 2% (20 MG/ML) 5 ML SYRINGE
INTRAMUSCULAR | Status: AC
  Filled 2022-08-27: qty 5

## 2022-08-27 MED ORDER — HYDROMORPHONE HCL 1 MG/ML IJ SOLN
INTRAMUSCULAR | Status: AC
  Filled 2022-08-27: qty 1

## 2022-08-27 MED ORDER — FENTANYL CITRATE (PF) 100 MCG/2ML IJ SOLN: 50.0000 ug | Freq: Once | INTRAMUSCULAR | Status: AC

## 2022-08-27 MED ORDER — FENTANYL CITRATE (PF) 250 MCG/5ML IJ SOLN
INTRAMUSCULAR | Status: DC | PRN
  Administered 2022-08-27 (×2): 25 ug via INTRAVENOUS
  Administered 2022-08-27: 50 ug via INTRAVENOUS
  Administered 2022-08-27: 25 ug via INTRAVENOUS
  Administered 2022-08-27 (×2): 50 ug via INTRAVENOUS

## 2022-08-27 MED ORDER — LACTATED RINGERS IV SOLN: INTRAVENOUS | Status: DC

## 2022-08-27 MED ORDER — CEFAZOLIN SODIUM-DEXTROSE 2-4 GM/100ML-% IV SOLN
2.0000 g | Freq: Three times a day (TID) | INTRAVENOUS | Status: AC
  Administered 2022-08-27 – 2022-08-28 (×2): 2 g via INTRAVENOUS
  Filled 2022-08-27 (×2): qty 100

## 2022-08-27 MED ORDER — CHLORHEXIDINE GLUCONATE 4 % EX LIQD
60.0000 mL | Freq: Once | CUTANEOUS | Status: DC
  Administered 2022-08-27: 4 via TOPICAL
  Filled 2022-08-27: qty 60

## 2022-08-27 MED ORDER — ACETAMINOPHEN 500 MG PO TABS
1000.0000 mg | ORAL_TABLET | Freq: Four times a day (QID) | ORAL | Status: DC
  Administered 2022-08-27 – 2022-09-10 (×53): 1000 mg via ORAL
  Filled 2022-08-27 (×52): qty 2

## 2022-08-27 MED ORDER — WATER FOR IRRIGATION, STERILE IR SOLN
Status: DC | PRN
  Administered 2022-08-27: 1000 mL

## 2022-08-27 MED ORDER — OXYCODONE HCL 5 MG/5ML PO SOLN: 5.0000 mg | Freq: Once | ORAL | Status: DC | PRN

## 2022-08-27 MED ORDER — PHENYLEPHRINE 80 MCG/ML (10ML) SYRINGE FOR IV PUSH (FOR BLOOD PRESSURE SUPPORT)
PREFILLED_SYRINGE | INTRAVENOUS | Status: AC
  Filled 2022-08-27: qty 10

## 2022-08-27 MED ORDER — TRANEXAMIC ACID-NACL 1000-0.7 MG/100ML-% IV SOLN
1000.0000 mg | INTRAVENOUS | Status: AC
  Administered 2022-08-27: 1000 mg via INTRAVENOUS
  Filled 2022-08-27: qty 100

## 2022-08-27 MED ORDER — HYDROMORPHONE HCL 1 MG/ML IJ SOLN
0.2500 mg | INTRAMUSCULAR | Status: DC | PRN
  Administered 2022-08-27 (×2): 0.5 mg via INTRAVENOUS

## 2022-08-27 MED ORDER — MIDAZOLAM HCL 2 MG/2ML IJ SOLN
INTRAMUSCULAR | Status: AC
  Filled 2022-08-27: qty 2

## 2022-08-27 MED ORDER — OXYCODONE HCL 5 MG PO TABS
5.0000 mg | ORAL_TABLET | ORAL | Status: DC | PRN
  Administered 2022-08-27 – 2022-09-01 (×12): 10 mg via ORAL
  Filled 2022-08-27 (×12): qty 2

## 2022-08-27 MED ORDER — LIDOCAINE 2% (20 MG/ML) 5 ML SYRINGE
INTRAMUSCULAR | Status: DC | PRN
  Administered 2022-08-27: 40 mg via INTRAVENOUS

## 2022-08-27 MED ORDER — SURGIRINSE WOUND IRRIGATION SYSTEM - OPTIME: TOPICAL | Status: DC | PRN

## 2022-08-27 MED ORDER — INSULIN ASPART 100 UNIT/ML IJ SOLN
0.0000 [IU] | INTRAMUSCULAR | Status: DC | PRN
  Administered 2022-08-27: 6 [IU] via SUBCUTANEOUS
  Filled 2022-08-27: qty 1

## 2022-08-27 MED ORDER — 0.9 % SODIUM CHLORIDE (POUR BTL) OPTIME
TOPICAL | Status: DC | PRN
  Administered 2022-08-27: 1000 mL

## 2022-08-27 MED ORDER — ORAL CARE MOUTH RINSE: 15.0000 mL | Freq: Once | OROMUCOSAL | Status: AC

## 2022-08-27 MED ORDER — ONDANSETRON HCL 4 MG/2ML IJ SOLN
INTRAMUSCULAR | Status: AC
  Filled 2022-08-27: qty 2

## 2022-08-27 MED ORDER — OXYCODONE HCL 5 MG PO TABS: 5.0000 mg | ORAL_TABLET | Freq: Once | ORAL | Status: DC | PRN

## 2022-08-27 MED ORDER — DEXAMETHASONE SODIUM PHOSPHATE 10 MG/ML IJ SOLN
INTRAMUSCULAR | Status: DC | PRN
  Administered 2022-08-27: 5 mg via INTRAVENOUS

## 2022-08-27 MED ORDER — SODIUM CHLORIDE (PF) 0.9 % IJ SOLN
INTRAMUSCULAR | Status: AC
  Filled 2022-08-27: qty 10

## 2022-08-27 MED ORDER — ROCURONIUM BROMIDE 10 MG/ML (PF) SYRINGE
PREFILLED_SYRINGE | INTRAVENOUS | Status: AC
  Filled 2022-08-27: qty 10

## 2022-08-27 MED ORDER — SODIUM CHLORIDE (PF) 0.9 % IJ SOLN
INTRAMUSCULAR | Status: DC | PRN
  Administered 2022-08-27: 80 mL

## 2022-08-27 MED ORDER — BUPIVACAINE LIPOSOME 1.3 % IJ SUSP
INTRAMUSCULAR | Status: AC
  Filled 2022-08-27: qty 20

## 2022-08-27 MED ORDER — SUGAMMADEX SODIUM 200 MG/2ML IV SOLN
INTRAVENOUS | Status: DC | PRN
  Administered 2022-08-27: 100 mg via INTRAVENOUS

## 2022-08-27 MED ORDER — CHLORHEXIDINE GLUCONATE 0.12 % MT SOLN
15.0000 mL | Freq: Once | OROMUCOSAL | Status: AC
  Administered 2022-08-27: 15 mL via OROMUCOSAL
  Filled 2022-08-27: qty 15

## 2022-08-27 MED ORDER — ROCURONIUM BROMIDE 10 MG/ML (PF) SYRINGE
PREFILLED_SYRINGE | INTRAVENOUS | Status: DC | PRN
  Administered 2022-08-27: 60 mg via INTRAVENOUS

## 2022-08-27 MED ORDER — FENTANYL CITRATE (PF) 250 MCG/5ML IJ SOLN
INTRAMUSCULAR | Status: AC
  Filled 2022-08-27: qty 5

## 2022-08-27 MED ORDER — CEFAZOLIN SODIUM-DEXTROSE 2-4 GM/100ML-% IV SOLN
2.0000 g | INTRAVENOUS | Status: AC
  Administered 2022-08-27: 2 g via INTRAVENOUS
  Filled 2022-08-27: qty 100

## 2022-08-27 MED ORDER — MIDAZOLAM HCL 2 MG/2ML IJ SOLN
0.5000 mg | Freq: Once | INTRAMUSCULAR | Status: AC | PRN
  Administered 2022-08-27: 0.5 mg via INTRAVENOUS

## 2022-08-27 MED ORDER — ONDANSETRON HCL 4 MG/2ML IJ SOLN
INTRAMUSCULAR | Status: DC | PRN
  Administered 2022-08-27: 4 mg via INTRAVENOUS

## 2022-08-27 MED ORDER — PROPOFOL 10 MG/ML IV BOLUS
INTRAVENOUS | Status: DC | PRN
  Administered 2022-08-27: 110 mg via INTRAVENOUS

## 2022-08-27 MED ORDER — MEPERIDINE HCL 25 MG/ML IJ SOLN: 6.2500 mg | INTRAMUSCULAR | Status: DC | PRN

## 2022-08-27 MED ORDER — PHENYLEPHRINE 80 MCG/ML (10ML) SYRINGE FOR IV PUSH (FOR BLOOD PRESSURE SUPPORT)
PREFILLED_SYRINGE | INTRAVENOUS | Status: DC | PRN
  Administered 2022-08-27: 80 ug via INTRAVENOUS
  Administered 2022-08-27: 160 ug via INTRAVENOUS
  Administered 2022-08-27 (×4): 80 ug via INTRAVENOUS

## 2022-08-27 MED ORDER — POVIDONE-IODINE 10 % EX SWAB
2.0000 | Freq: Once | CUTANEOUS | Status: AC
  Administered 2022-08-27: 2 via TOPICAL

## 2022-08-27 MED ORDER — FENTANYL CITRATE (PF) 100 MCG/2ML IJ SOLN
INTRAMUSCULAR | Status: AC
  Administered 2022-08-27: 50 ug via INTRAVENOUS
  Filled 2022-08-27: qty 2

## 2022-08-27 MED ORDER — PHENYLEPHRINE HCL-NACL 20-0.9 MG/250ML-% IV SOLN
INTRAVENOUS | Status: DC | PRN
  Administered 2022-08-27: 30 ug/min via INTRAVENOUS

## 2022-08-27 MED ORDER — ENOXAPARIN SODIUM 40 MG/0.4ML IJ SOSY
40.0000 mg | PREFILLED_SYRINGE | INTRAMUSCULAR | Status: DC
  Administered 2022-08-28 – 2022-09-10 (×14): 40 mg via SUBCUTANEOUS
  Filled 2022-08-27 (×14): qty 0.4

## 2022-08-27 MED ORDER — DEXAMETHASONE SODIUM PHOSPHATE 10 MG/ML IJ SOLN
INTRAMUSCULAR | Status: AC
  Filled 2022-08-27: qty 1

## 2022-08-27 MED ORDER — ALBUMIN HUMAN 5 % IV SOLN: INTRAVENOUS | Status: DC | PRN

## 2022-08-27 MED ORDER — PROMETHAZINE HCL 25 MG/ML IJ SOLN: 6.2500 mg | INTRAMUSCULAR | Status: DC | PRN

## 2022-08-27 MED ORDER — SODIUM CHLORIDE (PF) 0.9 % IJ SOLN
INTRAMUSCULAR | Status: AC
  Filled 2022-08-27: qty 50

## 2022-08-27 SURGICAL SUPPLY — 58 items
APL PRP STRL LF DISP 70% ISPRP (MISCELLANEOUS) ×2
BLADE SAGITTAL 25.0X1.27X90 (BLADE) ×1 IMPLANT
BRUSH FEMORAL CANAL (MISCELLANEOUS) IMPLANT
CHLORAPREP W/TINT 26 (MISCELLANEOUS) ×2 IMPLANT
COVER SURGICAL LIGHT HANDLE (MISCELLANEOUS) ×1 IMPLANT
DRAPE HALF SHEET 40X57 (DRAPES) ×1 IMPLANT
DRAPE HIP W/POCKET STRL (MISCELLANEOUS) ×1 IMPLANT
DRAPE INCISE IOBAN 85X60 (DRAPES) ×1 IMPLANT
DRAPE POUCH INSTRU U-SHP 10X18 (DRAPES) ×1 IMPLANT
DRAPE U-SHAPE 47X51 STRL (DRAPES) ×2 IMPLANT
DRSG AQUACEL AG ADV 3.5X10 (GAUZE/BANDAGES/DRESSINGS) ×1 IMPLANT
ELECT BLADE 4.0 EZ CLEAN MEGAD (MISCELLANEOUS) ×1
ELECTRODE BLDE 4.0 EZ CLN MEGD (MISCELLANEOUS) ×1 IMPLANT
GLOVE BIOGEL PI IND STRL 8 (GLOVE) ×1 IMPLANT
GLOVE SRG 8 PF TXTR STRL LF DI (GLOVE) ×1 IMPLANT
GLOVE SURG ORTHO 8.0 STRL STRW (GLOVE) ×2 IMPLANT
GLOVE SURG UNDER POLY LF SZ8 (GLOVE) ×1
GOWN STRL REUS W/ TWL LRG LVL3 (GOWN DISPOSABLE) ×2 IMPLANT
GOWN STRL REUS W/ TWL XL LVL3 (GOWN DISPOSABLE) ×1 IMPLANT
GOWN STRL REUS W/TWL LRG LVL3 (GOWN DISPOSABLE) ×2
GOWN STRL REUS W/TWL XL LVL3 (GOWN DISPOSABLE) ×1
HANDPIECE INTERPULSE COAX TIP (DISPOSABLE) ×1
HEAD CERAMIC FEMORAL 36MM (Head) IMPLANT
HOOD PEEL AWAY T7 (MISCELLANEOUS) ×3 IMPLANT
INSERT 0 DEGREE 36 (Miscellaneous) IMPLANT
IRRIGATION SURGIPHOR STRL (IV SOLUTION) ×1 IMPLANT
KIT BASIN OR (CUSTOM PROCEDURE TRAY) ×1 IMPLANT
KIT TURNOVER KIT A (KITS) ×1 IMPLANT
MANIFOLD NEPTUNE II (INSTRUMENTS) ×1 IMPLANT
MARKER SKIN DUAL TIP RULER LAB (MISCELLANEOUS) ×1 IMPLANT
NDL 18GX1X1/2 (RX/OR ONLY) (NEEDLE) ×1 IMPLANT
NEEDLE 18GX1X1/2 (RX/OR ONLY) (NEEDLE) ×1 IMPLANT
NS IRRIG 1000ML POUR BTL (IV SOLUTION) ×1 IMPLANT
PACK TOTAL JOINT (CUSTOM PROCEDURE TRAY) ×1 IMPLANT
PRESSURIZER FEMORAL UNIV (MISCELLANEOUS) IMPLANT
RETRIEVER SUT HEWSON (MISCELLANEOUS) ×1 IMPLANT
SCREW HEX LP 6.5X20 (Screw) IMPLANT
SCREW HEX LP 6.5X30 (Screw) IMPLANT
SEALER BIPOLAR AQUA 6.0 (INSTRUMENTS) ×1 IMPLANT
SET HNDPC FAN SPRY TIP SCT (DISPOSABLE) ×1 IMPLANT
SHELL ACETAB TRIDENT 48 (Shell) IMPLANT
STEM FEM ACCOLADE 38X102X30 S3 (Stem) IMPLANT
SUCTION FRAZIER HANDLE 10FR (MISCELLANEOUS) ×1
SUCTION TUBE FRAZIER 10FR DISP (MISCELLANEOUS) ×1 IMPLANT
SUT BONE WAX W31G (SUTURE) ×1 IMPLANT
SUT ETHIBOND 2 V 37 (SUTURE) ×1 IMPLANT
SUT MNCRL AB 3-0 PS2 18 (SUTURE) ×1 IMPLANT
SUT STRATAFIX 1PDS 45CM VIOLET (SUTURE) ×2 IMPLANT
SUT VIC AB 0 CT1 27 (SUTURE) ×1
SUT VIC AB 0 CT1 27XBRD ANBCTR (SUTURE) ×1 IMPLANT
SUT VIC AB 2-0 CT2 27 (SUTURE) ×2 IMPLANT
SYR 20ML LL LF (SYRINGE) ×1 IMPLANT
SYR 50ML LL SCALE MARK (SYRINGE) ×1 IMPLANT
TOWEL GREEN STERILE (TOWEL DISPOSABLE) ×1 IMPLANT
TOWER CARTRIDGE SMART MIX (DISPOSABLE) IMPLANT
TRAY FOLEY MTR SLVR 16FR STAT (SET/KITS/TRAYS/PACK) ×1 IMPLANT
TUBE SUCT ARGYLE STRL (TUBING) ×1 IMPLANT
WATER STERILE IRR 1000ML POUR (IV SOLUTION) ×1 IMPLANT

## 2022-08-27 NOTE — Op Note (Signed)
08/25/2022 - 08/27/2022  12:28 PM  PATIENT:  TOCCORA White   MRN: [id number]  PRE-OPERATIVE DIAGNOSIS: Right femoral neck fracture  POST-OPERATIVE DIAGNOSIS:  same  PROCEDURE:  Procedure(s): Right total hip arthroplasty  PREOPERATIVE INDICATIONS:    [name] is an 57 y.o. female who has a diagnosis of right displaced femoral neck fracture and elected for surgical management after failing conservative treatment.  The risks benefits and alternatives were discussed with the patient including but not limited to the risks of nonoperative treatment, versus surgical intervention including infection, bleeding, nerve injury, periprosthetic fracture, the need for revision surgery, dislocation, leg length discrepancy, blood clots, cardiopulmonary complications, morbidity, mortality, among others, and they were willing to proceed.     OPERATIVE REPORT     SURGEON:  Charlies Constable, MD    ASSISTANT: Dorise Bullion, PA-C, (Present throughout the entire procedure,  necessary for completion of procedure in a timely manner, assisting with retraction, instrumentation, and closure)     ANESTHESIA: General  ESTIMATED BLOOD LOSS: [id number]    COMPLICATIONS:  None.     COMPONENTS:   Stryker Trident 2 48 mm acetabular shell, Trident X.3 neutral liner, 6.5 hex screws x 2, Accolade 2 size #3 stem with 127 degree neck angle Implant Name Type Inv. Item Serial No. Manufacturer Lot No. LRB No. Used Action  SHELL ACETAB TRIDENT 48 - [id number] Shell SHELL ACETAB TRIDENT 48  [employer] [id number] A Right 1 Implanted  SCREW HEX LP 6.5X30 - [id number] Screw SCREW HEX LP 6.5X30  [employer] [id number] Right 1 Implanted  SCREW HEX LP 6.5X20 - [id number] Screw SCREW HEX LP 6.5X20  [employer] [id number] Right 1 Implanted  INSERT 0 DEGREE 36 - [id number] Miscellaneous INSERT 0 DEGREE 36  [employer] [id number] Right 1 Implanted  STEM FEM ACCOLADE [id number] S3 - [id number] Stem STEM FEM ACCOLADE  [id number] S3  [employer] [id number] A Right 1 Implanted  HEAD CERAMIC FEMORAL 36MM - [id number] Head HEAD CERAMIC FEMORAL 36MM  [employer] [id number] Right 1 Implanted      PROCEDURE IN DETAIL:   The patient was met in the holding area and  identified.  The appropriate hip was identified and marked at the operative site.  The patient was then transported to the OR  and  placed under anesthesia.  At that point, the patient was  placed in the lateral decubitus position with the operative side up and  secured to the operating room table  and all bony prominences padded. A subaxillary role was also placed.    The operative lower extremity was prepped from the iliac crest to the distal leg.  Sterile draping was performed.  Preoperative antibiotics, 2 gm of ancef,1 gm of Tranexamic Acid, and 8 mg of Decadron administered. Time out was performed prior to incision.      A routine posterolateral approach was utilized via sharp dissection  carried down to the subcutaneous tissue.  Gross bleeders were Bovie coagulated.  The iliotibial band was identified and incised along the length of the skin incision through the glute max fascia.  Charnley retractor was placed with care to protect the sciatic nerve posteriorly.  With the hip internally rotated, the piriformis tendon was identified and released from the femoral insertion and tagged with a #2 Ethibond.  A capsulotomy was then performed off the femoral insertion and also tagged with a #2 Ethibond.    The femoral neck was exposed and resected just below the femoral neck fracture.  The femoral head  was removed with the corkscrew without difficulty.    I then exposed the deep acetabulum, cleared out any tissue including the ligamentum teres.  After adequate visualization, I excised the labrum.  I then started reaming with a 42 mm reamer, first medializing to the floor of the cotyloid fossa, and then in the position of the cup aiming towards the greater  sciatic notch, matching the version of the transverse acetabular ligament and tucked under the anterior wall. I reamed up to 48 mm reamer with good bony bed preparation and a 48 mm cup was chosen.  The real cup was then impacted into place.  Appropriate version and inclination was confirmed clinically matching their bony anatomy, and also with the use of the jig.  I placed 2 screws in the posterior superior quadrant to augment fixation.  A neutral liner was placed and impacted. It was confirmed to be appropriately seated and the acetabular retractors were removed.    I then prepared the proximal femur using the box cutter, Charnley awl, and then sequentially broached starting with 0 up to a size 1.  A trial broach, neck, and head was utilized, and I reduced the hip and it was found to have excellent stability.  There was no impingement with full extension and 90 degrees external rotation.  The hip was stable at the position of sleep and with 90 degrees flexion and 90degrees of internal rotation.  Leg lengths were also clinically assessed in the lateral position and felt to be equal. Intra-Op flatplate was obtained which showed good restoration of leg length, relatively diminished offset, and stem was slightly undersized and in varus. no evidence or concern for fracture.   Elected to lateralize and workup to a larger broach.  Lateralized and got up to a size 3.  This had increased offset and length as well.  Leg lengths again felt clinically equal and very stable on IntraOp testing.  A final femoral prosthesis size 3 was selected. I then impacted the real femoral prosthesis into place.I again trialed and selected a 36+ 96m ball. The hip was then reduced and taken through a range of motion. There was no impingement with full extension and 90 degrees external rotation.  The hip was stable at the position of sleep and with 90 degrees flexion and 90degrees of internal rotation. Leg lengths were  again assessed  and felt to be restored.  We then opened, and I impacted the real head ball into place.  The posterior capsule was then closed with #2 Ethibond.  The piriformis was repaired through the base of the abductor tendon using a [place] suture passer.  I then irrigated the hip copiously with dilute Betadine and with normal saline pulse lavage. Periarticular injection was then performed with Exparel.   We repaired the fascia #1 barbed suture, followed by 0 barbed suture for the subcutaneous fat.  Skin was closed with 2-0 Vicryl and 3-0 Monocryl.  Dermabond and Aquacel dressing were applied. The patient was then awakened and returned to PACU in stable and satisfactory condition.  Leg lengths in the supine position were assessed and felt to be clinically equal. There were no complications.  Post op recs: WB: Toe-touch weightbearing right lower extremity given nonoperative tibial plateau fracture, No formal hip precautions Abx: ancef Imaging: PACU pelvis Xray Dressing: Aquacell, keep intact until follow up DVT prophylaxis: Lovenox 40 mg starting POD1 x 4 weeks Follow up: 2 weeks after surgery for a wound check with Dr. [facility]  [employer].  Address: [address], [place], [place] [address]  Office Phone: [phone]   Charlies Constable, MD Orthopedic Surgeon

## 2022-08-27 NOTE — Anesthesia Postprocedure Evaluation (Signed)
Anesthesia Post Note  Patient: [name]  Procedure(s) Performed: TOTAL HIP ARTHROPLASTY (Right: Hip)     Patient location during evaluation: PACU Anesthesia Type: General Level of consciousness: awake and alert Pain management: pain level controlled Vital Signs Assessment: post-procedure vital signs reviewed and stable Respiratory status: spontaneous breathing, nonlabored ventilation, respiratory function stable and patient connected to nasal cannula oxygen Cardiovascular status: blood pressure returned to baseline and stable Postop Assessment: no apparent nausea or vomiting Anesthetic complications: no   No notable events documented.  Last Vitals:  Vitals:   08/27/22 1345 08/27/22 1606  BP: 132/82 (!) 137/90  Pulse: (!) 110 (!) 104  Resp: 15 16  Temp:  37.4 C  SpO2: 95% 94%    Last Pain:  Vitals:   08/27/22 1716  TempSrc:   PainSc: 5                  Varsha Knock

## 2022-08-27 NOTE — Discharge Instructions (Addendum)
Soft diet for 2 more weeks due to facial fractures  INSTRUCTIONS AFTER JOINT REPLACEMENT   Remove items at home which could result in a fall. This includes throw rugs or furniture in walking pathways ICE to the affected joint every three hours while awake for 30 minutes at a time, for at least the first 3-5 days, and then as needed for pain and swelling.  Continue to use ice for pain and swelling. You may notice swelling that will progress down to the foot and ankle.  This is normal after surgery.  Elevate your leg when you are not up walking on it.   Continue to use the breathing machine you got in the hospital (incentive spirometer) which will help keep your temperature down.  It is common for your temperature to cycle up and down following surgery, especially at night when you are not up moving around and exerting yourself.  The breathing machine keeps your lungs expanded and your temperature down.   DIET:  As you were doing prior to hospitalization, we recommend a well-balanced diet.  DRESSING / WOUND CARE / SHOWERING  Okay to leave incision open to air. Okay to shower and get incision area wet. No lotions or ointments on incision. No submerging incision in bath or pool.  ACTIVITY  Increase activity slowly as tolerated, but follow the weight bearing instructions below.   No driving for 6 weeks or until further direction given by your physician.  You cannot drive while taking narcotics.  No lifting or carrying greater than 10 lbs. until further directed by your surgeon. Avoid periods of inactivity such as sitting longer than an hour when not asleep. This helps prevent blood clots.  You may return to work once you are authorized by your doctor.     WEIGHT BEARING  Touchdown weightbearing right leg with assist device (walker, cane, etc) as directed, use it as long as suggested by your surgeon or therapist, typically at least 4-6 weeks. Keep brace on with mobility. Okay to allow for range  of motion 0-90 degrees.   EXERCISES  Results after joint replacement surgery are often greatly improved when you follow the exercise, range of motion and muscle strengthening exercises prescribed by your doctor. Safety measures are also important to protect the joint from further injury. Any time any of these exercises cause you to have increased pain or swelling, decrease what you are doing until you are comfortable again and then slowly increase them. If you have problems or questions, call your caregiver or physical therapist for advice.   Rehabilitation is important following a joint replacement. After just a few days of immobilization, the muscles of the leg can become weakened and shrink (atrophy).  These exercises are designed to build up the tone and strength of the thigh and leg muscles and to improve motion. Often times heat used for twenty to thirty minutes before working out will loosen up your tissues and help with improving the range of motion but do not use heat for the first two weeks following surgery (sometimes heat can increase post-operative swelling).   These exercises can be done on a training (exercise) mat, on the floor, on a table or on a bed. Use whatever works the best and is most comfortable for you.    Use music or television while you are exercising so that the exercises are a pleasant break in your day. This will make your life better with the exercises acting as a break in your  routine that you can look forward to.   Perform all exercises about fifteen times, three times per day or as directed.  You should exercise both the operative leg and the other leg as well.  Exercises include:   Quad Sets - Tighten up the muscle on the front of the thigh (Quad) and hold for 5-10 seconds.   Straight Leg Raises - With your knee straight (if you were given a brace, keep it on), lift the leg to 60 degrees, hold for 3 seconds, and slowly lower the leg.  Perform this exercise against  resistance later as your leg gets stronger.  Leg Slides: Lying on your back, slowly slide your foot toward your buttocks, bending your knee up off the floor (only go as far as is comfortable). Then slowly slide your foot back down until your leg is flat on the floor again.  Angel Wings: Lying on your back spread your legs to the side as far apart as you can without causing discomfort.  Hamstring Strength:  Lying on your back, push your heel against the floor with your leg straight by tightening up the muscles of your buttocks.  Repeat, but this time bend your knee to a comfortable angle, and push your heel against the floor.  You may put a pillow under the heel to make it more comfortable if necessary.   A rehabilitation program following joint replacement surgery can speed recovery and prevent re-injury in the future due to weakened muscles. Contact your doctor or a physical therapist for more information on knee rehabilitation.    CONSTIPATION  Constipation is defined medically as fewer than three stools per week and severe constipation as less than one stool per week.  Even if you have a regular bowel pattern at home, your normal regimen is likely to be disrupted due to multiple reasons following surgery.  Combination of anesthesia, postoperative narcotics, change in appetite and fluid intake all can affect your bowels.   YOU MUST use at least one of the following options; they are listed in order of increasing strength to get the job done.  They are all available over the counter, and you may need to use some, POSSIBLY even all of these options:    Drink plenty of fluids (prune juice may be helpful) and high fiber foods Colace 100 mg by mouth twice a day  Senokot for constipation as directed and as needed Dulcolax (bisacodyl), take with full glass of water  Miralax (polyethylene glycol) once or twice a day as needed.  If you have tried all these things and are unable to have a bowel movement  in the first 3-4 days after surgery call either your surgeon or your primary doctor.    If you experience loose stools or diarrhea, hold the medications until you stool forms back up.  If your symptoms do not get better within 1 week or if they get worse, check with your doctor.  If you experience "the worst abdominal pain ever" or develop nausea or vomiting, please contact the office immediately for further recommendations for treatment.   ITCHING:  If you experience itching with your medications, try taking only a single pain pill, or even half a pain pill at a time.  You can also use Benadryl over the counter for itching or also to help with sleep.   TED HOSE STOCKINGS:  Use stockings on both legs until for at least 2 weeks or as directed by physician office. They may be  removed at night for sleeping.  MEDICATIONS:  See your medication summary on the "After Visit Summary" that nursing will review with you.  You may have some home medications which will be placed on hold until you complete the course of blood thinner medication.  It is important for you to complete the blood thinner medication as prescribed.   Blood clot prevention (DVT Prophylaxis): After surgery you are at an increased risk for a blood clot. you were prescribed a blood thinner, lovenox, to be taken twice daily for 4 weeks from surgery to help reduce your risk of getting a blood clot. This will help prevent a blood clot. Signs of a pulmonary embolus (blood clot in the lungs) include sudden short of breath, feeling lightheaded or dizzy, chest pain with a deep breath, rapid pulse rapid breathing. Signs of a blood clot in your arms or legs include new unexplained swelling and cramping, warm, red or darkened skin around the painful area. Please call the office or 911 right away if these signs or symptoms develop.  PRECAUTIONS:  If you experience chest pain or shortness of breath - call 911 immediately for transfer to the hospital  emergency department.   If you develop a fever greater that 101 F, purulent drainage from wound, increased redness or drainage from wound, foul odor from the wound/dressing, or calf pain - CONTACT YOUR SURGEON.                                                   FOLLOW-UP APPOINTMENTS:  If you do not already have a post-op appointment, please call the office for an appointment to be seen by your surgeon.  Guidelines for how soon to be seen are listed in your "After Visit Summary", but are typically between 2-3 weeks after surgery.  OTHER INSTRUCTIONS:   POST-OPERATIVE OPIOID TAPER INSTRUCTIONS: It is important to wean off of your opioid medication as soon as possible. If you do not need pain medication after your surgery it is ok to stop day one. Opioids include: Codeine, Hydrocodone(Norco, Vicodin), Oxycodone(Percocet, oxycontin) and hydromorphone amongst others.  Long term and even short term use of opiods can cause: Increased pain response Dependence Constipation Depression Respiratory depression And more.  Withdrawal symptoms can include Flu like symptoms Nausea, vomiting And more Techniques to manage these symptoms Hydrate well Eat regular healthy meals Stay active Use relaxation techniques(deep breathing, meditating, yoga) Do Not substitute Alcohol to help with tapering If you have been on opioids for less than two weeks and do not have pain than it is ok to stop all together.  Plan to wean off of opioids This plan should start within one week post op of your joint replacement. Maintain the same interval or time between taking each dose and first decrease the dose.  Cut the total daily intake of opioids by one tablet each day Next start to increase the time between doses. The last dose that should be eliminated is the evening dose.   MAKE SURE YOU:  Understand these instructions.  Get help right away if you are not doing well or get worse.    Thank you for letting us be  a part of your medical care team.  It is a privilege we respect greatly.  We hope these instructions will help you stay on track for a fast  and full recovery!     Seizure precautions: Per [facility] statutes, patients with seizures are not allowed to drive until they have been seizure-free for six months and cleared by a physician    Use caution when using heavy equipment or power tools. Avoid working on ladders or at heights. Take showers instead of baths. Ensure the water temperature is not too high on the home water heater. Do not go swimming alone. Do not lock yourself in a room alone (i.e. bathroom). When caring for infants or small children, sit down when holding, feeding, or changing them to minimize risk of injury to the child in the event you have a seizure. Maintain good sleep hygiene. Avoid alcohol.    If patient has another seizure, call 911 and bring them back to the ED if: A.  The seizure lasts longer than 5 minutes.      B.  The patient doesn't wake shortly after the seizure or has new problems such as difficulty seeing, speaking or moving following the seizure C.  The patient was injured during the seizure D.  The patient has a temperature over 102 F (39C) E.  The patient vomited during the seizure and now is having trouble breathing    During the Seizure   - First, ensure adequate ventilation and place patients on the floor on their left side  Loosen clothing around the neck and ensure the airway is patent. If the patient is clenching the teeth, do not force the mouth open with any object as this can cause severe damage - Remove all items from the surrounding that can be hazardous. The patient may be oblivious to what's happening and may not even know what he or she is doing. If the patient is confused and wandering, either gently guide him/her away and block access to outside areas - Reassure the individual and be comforting - Call 911. In most cases, the seizure ends  before EMS arrives. However, there are cases when seizures may last over 3 to 5 minutes. Or the individual may have developed breathing difficulties or severe injuries. If a pregnant patient or a person with diabetes develops a seizure, it is prudent to call an ambulance. - Finally, if the patient does not regain full consciousness, then call EMS. Most patients will remain confused for about 45 to 90 minutes after a seizure, so you must use judgment in calling for help. - Avoid restraints but make sure the patient is in a bed with padded side rails - Place the individual in a lateral position with the neck slightly flexed; this will help the saliva drain from the mouth and prevent the tongue from falling backward - Remove all nearby furniture and other hazards from the area - Provide verbal assurance as the individual is regaining consciousness - Provide the patient with privacy if possible - Call for help and start treatment as ordered by the caregiver    After the Seizure (Postictal Stage)   After a seizure, most patients experience confusion, fatigue, muscle pain and/or a headache. Thus, one should permit the individual to sleep. For the next few days, reassurance is essential. Being calm and helping reorient the person is also of importance.   Most seizures are painless and end spontaneously. Seizures are not harmful to others but can lead to complications such as stress on the lungs, brain and the heart. Individuals with prior lung problems may develop labored breathing and respiratory distress.

## 2022-08-27 NOTE — Interval H&P Note (Signed)
The patient has been re-examined, and the chart reviewed, and there have been no interval changes to the documented history and physical.    Plan for R THA for femoral neck fracture  The operative side was examined and the patient was confirmed to have sensation to DPN, SPN, TN intact, Motor EHL, ext, flex 5/5, and DP 2+, PT 2+, No significant edema.   The risks, benefits, and alternatives have been discussed at length with patient, and the patient is willing to proceed.  Right hip marked. Consent has been signed.
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
## 2022-08-27 NOTE — Anesthesia Preprocedure Evaluation (Addendum)
Anesthesia Evaluation  Patient identified by MRN, date of birth, ID band Patient awake    Reviewed: Allergy & Precautions, NPO status , Patient's Chart, lab work & pertinent test results  History of Anesthesia Complications Negative for: history of anesthetic complications  Airway Mallampati: I  TM Distance: >3 FB Neck ROM: Full    Dental  (+) Dental Advisory Given   Pulmonary COPD, Current Smoker and Patient abstained from smoking.   breath sounds clear to auscultation       Cardiovascular hypertension,  Rhythm:Regular Rate:Normal     Neuro/Psych  Headaches, Seizures -,   Anxiety Depression Bipolar Disorder Schizophrenia     GI/Hepatic ,GERD  Controlled,,(+)     substance abuse (no cocaine for several days)  alcohol use, cocaine use and marijuana useGASTRIC ROUX-EN-Y   Endo/Other  diabetes (glu 227)Hypothyroidism    Renal/GU negative Renal ROS     Musculoskeletal   Abdominal   Peds  Hematology  (+) Blood dyscrasia (Hb 10.9), anemia   Anesthesia Other Findings L transverse process fracture of L1 and she was recommended for outpatient neurosurgery follow-up.  She was also found to have probable subacute left lateral clavicular fracture right ZMC fracture does not require ORIF right hip hx and right tibia fx  Reproductive/Obstetrics                             Anesthesia Physical Anesthesia Plan  ASA: 3  Anesthesia Plan: General   Post-op Pain Management: Tylenol PO (pre-op)*   Induction: Intravenous  PONV Risk Score and Plan: 2 and Ondansetron and Dexamethasone  Airway Management Planned: Oral ETT  Additional Equipment: None  Intra-op Plan:   Post-operative Plan: Extubation in OR  Informed Consent: I have reviewed the patients History and Physical, chart, labs and discussed the procedure including the risks, benefits and alternatives for the proposed anesthesia with the  patient or authorized representative who has indicated his/her understanding and acceptance.     Dental advisory given  Plan Discussed with: CRNA and Surgeon  Anesthesia Plan Comments:        Anesthesia Quick Evaluation

## 2022-08-27 NOTE — Plan of Care (Signed)

## 2022-08-27 NOTE — Anesthesia Procedure Notes (Signed)
Procedure Name: Intubation Date/Time: 08/27/2022 10:56 AM  Performed by: Janene Harvey, CRNAPre-anesthesia Checklist: Patient identified, Emergency Drugs available, Suction available and Patient being monitored Patient Re-evaluated:Patient Re-evaluated prior to induction Oxygen Delivery Method: Circle system utilized Preoxygenation: Pre-oxygenation with 100% oxygen Induction Type: IV induction Ventilation: Mask ventilation without difficulty Laryngoscope Size: Mac and 3 Grade View: Grade I Tube type: Oral Tube size: 7.0 mm Number of attempts: 1 Airway Equipment and Method: Stylet and Oral airway Placement Confirmation: ETT inserted through vocal cords under direct vision, positive ETCO2 and breath sounds checked- equal and bilateral Secured at: 21 cm Tube secured with: Tape Dental Injury: Teeth and Oropharynx as per pre-operative assessment

## 2022-08-27 NOTE — Progress Notes (Signed)
FBS 327, no current order for S/S. paged [facility] surgery, awaiting response. Handoff to night shift nurse.
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
## 2022-08-27 NOTE — Transfer of Care (Signed)
Immediate Anesthesia Transfer of Care Note  Patient: [name]  Procedure(s) Performed: TOTAL HIP ARTHROPLASTY (Right: Hip)  Patient Location: PACU  Anesthesia Type:General  Level of Consciousness: drowsy and patient cooperative  Airway & Oxygen Therapy: Patient Spontanous Breathing and Patient connected to face mask oxygen  Post-op Assessment: Report given to RN and Post -op Vital signs reviewed and stable  Post vital signs: Reviewed and stable  Last Vitals:  Vitals Value Taken Time  BP 138/76 08/27/22 1315  Temp 36.7 C 08/27/22 1307  Pulse 121 08/27/22 1317  Resp 16 08/27/22 1317  SpO2 91 % 08/27/22 1317  Vitals shown include unvalidated device data.  Last Pain:  Vitals:   08/27/22 0917  TempSrc:   PainSc: 10-Worst pain ever      Patients Stated Pain Goal: 0 ([phone] [id number])  Complications: No notable events documented.

## 2022-08-28 ENCOUNTER — Encounter (HOSPITAL_COMMUNITY): Payer: Self-pay | Admitting: Orthopedic Surgery

## 2022-08-28 DIAGNOSIS — Z9151 Personal history of suicidal behavior: Secondary | ICD-10-CM

## 2022-08-28 LAB — CBC
HCT: 25.2 % — ABNORMAL LOW (ref 36.0–46.0)
Hemoglobin: 8.4 g/dL — ABNORMAL LOW (ref 12.0–15.0)
MCH: 28.7 pg (ref 26.0–34.0)
MCHC: 33.3 g/dL (ref 30.0–36.0)
MCV: 86 fL (ref 80.0–100.0)
Platelets: 140 10*3/uL — ABNORMAL LOW (ref 150–400)
RBC: 2.93 MIL/uL — ABNORMAL LOW (ref 3.87–5.11)
RDW: 15.4 % (ref 11.5–15.5)
WBC: 6.3 10*3/uL (ref 4.0–10.5)
nRBC: 0 % (ref 0.0–0.2)

## 2022-08-28 LAB — VITAMIN B1: Vitamin B1 (Thiamine): 173.6 nmol/L (ref 66.5–200.0)

## 2022-08-28 LAB — GLUCOSE, CAPILLARY
Glucose-Capillary: 331 mg/dL — ABNORMAL HIGH (ref 70–99)
Glucose-Capillary: 228 mg/dL — ABNORMAL HIGH (ref 70–99)
Glucose-Capillary: 163 mg/dL — ABNORMAL HIGH (ref 70–99)
Glucose-Capillary: 295 mg/dL — ABNORMAL HIGH (ref 70–99)

## 2022-08-28 LAB — BASIC METABOLIC PANEL
Anion gap: 7 (ref 5–15)
BUN: 8 mg/dL (ref 6–20)
CO2: 24 mmol/L (ref 22–32)
Calcium: 8.4 mg/dL — ABNORMAL LOW (ref 8.9–10.3)
Chloride: 104 mmol/L (ref 98–111)
Creatinine, Ser: 0.51 mg/dL (ref 0.44–1.00)
GFR, Estimated: >60 mL/min (ref >60)
Glucose, Bld: 279 mg/dL — ABNORMAL HIGH (ref 70–99)
Potassium: 4.3 mmol/L (ref 3.5–5.1)
Sodium: 135 mmol/L (ref 135–145)

## 2022-08-28 MED ORDER — INSULIN ASPART 100 UNIT/ML IJ SOLN
0.0000 [IU] | Freq: Every day | INTRAMUSCULAR | Status: DC
  Administered 2022-08-28: 3 [IU] via SUBCUTANEOUS

## 2022-08-28 MED ORDER — INSULIN ASPART 100 UNIT/ML IJ SOLN
0.0000 [IU] | Freq: Three times a day (TID) | INTRAMUSCULAR | Status: DC
  Administered 2022-08-28 – 2022-08-29 (×2): 5 [IU] via SUBCUTANEOUS
  Administered 2022-08-29: 8 [IU] via SUBCUTANEOUS
  Administered 2022-08-30: 5 [IU] via SUBCUTANEOUS
  Administered 2022-08-30: 2 [IU] via SUBCUTANEOUS
  Administered 2022-08-30: 11 [IU] via SUBCUTANEOUS
  Administered 2022-08-31: 5 [IU] via SUBCUTANEOUS
  Administered 2022-08-31: 8 [IU] via SUBCUTANEOUS
  Administered 2022-08-31 – 2022-09-01 (×4): 3 [IU] via SUBCUTANEOUS
  Administered 2022-09-02: 2 [IU] via SUBCUTANEOUS
  Administered 2022-09-02: 3 [IU] via SUBCUTANEOUS
  Administered 2022-09-02: 2 [IU] via SUBCUTANEOUS
  Administered 2022-09-03: 5 [IU] via SUBCUTANEOUS
  Administered 2022-09-03 – 2022-09-04 (×3): 3 [IU] via SUBCUTANEOUS
  Administered 2022-09-05 – 2022-09-06 (×4): 5 [IU] via SUBCUTANEOUS
  Administered 2022-09-07: 2 [IU] via SUBCUTANEOUS
  Administered 2022-09-08: 3 [IU] via SUBCUTANEOUS
  Administered 2022-09-08: 5 [IU] via SUBCUTANEOUS
  Administered 2022-09-09 (×2): 3 [IU] via SUBCUTANEOUS
  Administered 2022-09-09: 5 [IU] via SUBCUTANEOUS
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
## 2022-08-28 NOTE — Progress Notes (Signed)
Orthopedic Tech Progress Note Patient Details:  [name] Aug 13, 1965 [id number] Called in ROM Knee brace to [facility]  Patient ID: [name], female   DOB: [birth year], 57 y.o.   MRN: [id number]  [name] 08/28/2022, 10:13 AM

## 2022-08-28 NOTE — Consult Note (Signed)
   [facility] CM Inpatient Consult   08/28/2022  [name] July 28, 1965 [id number]  [facility] Referral:  Readmission high risk THN listed and [facility] ACO banner and reviewed for [facility] provider follow up  Primary Care Provider:  [name], MD this provider is not an [place] provider  Insurance:  [employer]   Unfortunately, our Care management team will not be able to assist you with this patient. The patient is not on the current member enrollment rosters for any of the [place] risk contracted plans with a [place] manager.   .   Reason:  Not a beneficiary currently attributed to one of the [place].    Patient's primary care provider listed is not an in-network provider at this time. .  For questions, please call:  Natividad Brood, RN BSN [place]  [phone] business mobile phone Toll free office [phone]  *[place]  [phone] Fax number: [phone] [email or web address]

## 2022-08-28 NOTE — Plan of Care (Signed)
  Problem: Clinical Measurements: Goal: Ability to maintain clinical measurements within normal limits will improve Outcome: Progressing Goal: Will remain free from infection Outcome: Progressing Goal: Respiratory complications will improve Outcome: Progressing Goal: Cardiovascular complication will be avoided Outcome: Progressing   Problem: Activity: Goal: Risk for activity intolerance will decrease Outcome: Progressing
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
## 2022-08-28 NOTE — TOC Progression Note (Addendum)
Transition of Care [facility]) - Progression Note    Patient Details  Name: [name] MRN: [id number] Date of Birth: 10/03/1965  [facility] CM/SW Contact  Ella Bodo, RN Phone Number: 08/28/2022, 4:03 PM  Clinical Narrative:    Patient has been discussed in multidisciplinary Trauma Rounds.  PT recommending SNF, though unlikely to be placed due to psych hx and behaviors.  CIR to screen for possible CIR; patient has good support at home with brother and daughter.  Will follow progress.    Expected Discharge Plan: [place] Barriers to Discharge: Continued Medical Work up  Expected Discharge Plan and Services   Discharge Planning Services: CM Consult   Living arrangements for the past 2 months: [place] Determinants of Health (SDOH) Interventions [place]: No Food Insecurity (05/06/2022)  Housing: Low Risk  (05/06/2022)  Transportation Needs: No Transportation Needs (05/06/2022)  Utilities: Not At Risk (05/06/2022)  Alcohol Screen: Medium Risk (08/04/2018)  Social Connections: Moderately Isolated (05/06/2022)  Tobacco Use: High Risk (08/28/2022)    Readmission Risk Interventions    04/28/2022    2:18 PM 04/07/2022   12:41 PM 07/27/2020   11:06 AM  Readmission Risk Prevention Plan  Transportation Screening Complete Complete Complete  PCP or Specialist Appt within 5-7 Days   Complete  Home Care Screening   Complete  HRI or Home Care Consult  Complete   Social Work Consult for [place] Planning/Counseling  Complete   Palliative Care Screening  Not Applicable   Medication Review Press photographer) Complete Complete   PCP or Specialist appointment within 3-5 days of discharge Complete    HRI or [place] Complete    SW Recovery Care/Counseling Consult Complete    Palliative Care Screening Not [place] Not Applicable      Reinaldo Raddle, RN, BSN  Trauma/Neuro ICU Case Manager [phone]
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
## 2022-08-28 NOTE — Progress Notes (Signed)
   08/28/22 0825  Assess: MEWS Score  Temp 98.7 F (37.1 C)  BP 113/68  MAP (mmHg) 78  Pulse Rate (!) 112  Resp 17  SpO2 93 %  O2 Device Room Air  Assess: MEWS Score  MEWS Temp 0  MEWS Systolic 0  MEWS Pulse 2  MEWS RR 0  MEWS LOC 0  MEWS Score 2  MEWS Score Color Yellow  Assess: if the MEWS score is Yellow or Red  Were vital signs taken at a resting state? Yes  Focused Assessment No change from prior assessment  Does the patient meet 2 or more of the SIRS criteria? No  MEWS guidelines implemented  Yes, yellow  Treat  MEWS Interventions Considered administering scheduled or prn medications/treatments as ordered  Take Vital Signs  Increase Vital Sign Frequency  Yellow: Q2hr x1, continue Q4hrs until patient remains green for 12hrs  Escalate  MEWS: Escalate Yellow: Discuss with charge nurse and consider notifying provider and/or RRT  Notify: Charge Nurse/RN  Name of Charge Nurse/RN Notified Ben  Assess: SIRS CRITERIA  SIRS Temperature  0  SIRS Pulse 1  SIRS Respirations  0  SIRS WBC 0  SIRS Score Sum  1

## 2022-08-29 DIAGNOSIS — T1490XA Injury, unspecified, initial encounter: Secondary | ICD-10-CM | POA: Diagnosis not present

## 2022-08-29 DIAGNOSIS — F259 Schizoaffective disorder, unspecified: Secondary | ICD-10-CM | POA: Diagnosis not present

## 2022-08-29 LAB — GLUCOSE, CAPILLARY
Glucose-Capillary: 245 mg/dL — ABNORMAL HIGH (ref 70–99)
Glucose-Capillary: 264 mg/dL — ABNORMAL HIGH (ref 70–99)
Glucose-Capillary: 70 mg/dL (ref 70–99)
Glucose-Capillary: 187 mg/dL — ABNORMAL HIGH (ref 70–99)

## 2022-08-29 LAB — CBC
HCT: 28.2 % — ABNORMAL LOW (ref 36.0–46.0)
Hemoglobin: 9.4 g/dL — ABNORMAL LOW (ref 12.0–15.0)
MCH: 29.3 pg (ref 26.0–34.0)
MCHC: 33.3 g/dL (ref 30.0–36.0)
MCV: 87.9 fL (ref 80.0–100.0)
Platelets: 164 10*3/uL (ref 150–400)
RBC: 3.21 MIL/uL — ABNORMAL LOW (ref 3.87–5.11)
RDW: 15.7 % — ABNORMAL HIGH (ref 11.5–15.5)
WBC: 7.8 10*3/uL (ref 4.0–10.5)
nRBC: 0 % (ref 0.0–0.2)

## 2022-08-29 MED ORDER — QUETIAPINE FUMARATE ER 200 MG PO TB24
200.0000 mg | ORAL_TABLET | Freq: Every day | ORAL | Status: DC
  Administered 2022-08-29 – 2022-09-09 (×12): 200 mg via ORAL
  Filled 2022-08-29 (×14): qty 1

## 2022-08-29 MED ORDER — QUETIAPINE FUMARATE 50 MG PO TABS
25.0000 mg | ORAL_TABLET | Freq: Three times a day (TID) | ORAL | Status: DC | PRN
  Administered 2022-08-29 – 2022-09-09 (×12): 25 mg via ORAL
  Filled 2022-08-29 (×11): qty 1

## 2022-08-29 MED ORDER — MUPIROCIN 2 % EX OINT
TOPICAL_OINTMENT | Freq: Two times a day (BID) | CUTANEOUS | Status: DC
  Administered 2022-08-29: 1 via NASAL
  Filled 2022-08-29 (×2): qty 22

## 2022-08-29 MED ORDER — QUETIAPINE FUMARATE ER 50 MG PO TB24
100.0000 mg | ORAL_TABLET | Freq: Every day | ORAL | Status: DC
  Filled 2022-08-29: qty 2
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
## 2022-08-29 NOTE — Progress Notes (Signed)
OT Cancellation Note  Patient Details Name: [name] MRN: [id number] DOB: [birth year]   Cancelled Treatment:    Reason Eval/Treat Not Completed: Patient's level of consciousness (Pt continuous to be unable to participate in OT evaluation due to lethargy. Has been provided Ativan today and is unable to be alert. Will re-attempt OT evaluation when pt is able to participate.)  Ailene Ravel, OTR/L,CBIS  Supplemental OT - [facility] and [facility] Secure Chat Preferred   08/29/2022, 3:00 PM

## 2022-08-29 NOTE — Progress Notes (Signed)
Called for tele sitter , I was informed the hospital is out of tele sitters

## 2022-08-29 NOTE — Progress Notes (Signed)
Inpatient Rehab Admissions Coordinator:   Discussed in trauma rounds and briefly reviewed with Dr. Naaman Plummer.  Will place a CIR consult per protocol.   Shann Medal, PT, DPT Admissions Coordinator [phone] 08/29/22  9:44 AM
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
## 2022-08-29 NOTE — Progress Notes (Signed)
OT Cancellation Note  Patient Details Name: [name] MRN: [id number] DOB: 1965/07/29   Cancelled Treatment:    Reason Eval/Treat Not Completed: Patient's level of consciousness (Pt received Ativan this AM and is unable to participate in OT eval per nursing. Will check on patient this afternoon and assess her ability to participate.)  Ailene Ravel, OTR/L,CBIS  Supplemental OT - [facility] Secure Chat Preferred   08/29/2022, 10:56 AM
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
## 2022-08-29 NOTE — Progress Notes (Signed)
Pt has been very agitated this shift. Attempted many times to get OOB. Bed alarm remains on, floor mats bedside bed and rails padded. Dr. Kieth Brightly notified of need for [facility].

## 2022-08-29 NOTE — Care Management Important Message (Signed)
Important Message  Patient Details  Name: [name] MRN: [id number] Date of Birth: 02-12-1966   Medicare Important Message Given:  Yes     [name] 08/29/2022, 12:25 PM

## 2022-08-29 NOTE — TOC Progression Note (Signed)
Transition of Care [facility]) - Progression Note    Patient Details  Name: [name] MRN: [id number] Date of Birth: 1965/07/02  Transition of Care [facility]) CM/SW Contact  Ella Bodo, RN Phone Number: 08/29/2022, 3:59 PM  Clinical Narrative:    Noted  Dr. Candise Che evaluation; he states that patient likely a good rehab candidate, and admissions coordinator to follow.  TOC will continue to follow patient progress.   Expected Discharge Plan: [place] Barriers to Discharge: Continued Medical Work up  Expected Discharge Plan and Services   Discharge Planning Services: CM Consult   Living arrangements for the past 2 months: [place] Determinants of Health (SDOH) Interventions [place]: No Food Insecurity (05/06/2022)  Housing: Low Risk  (05/06/2022)  Transportation Needs: No Transportation Needs (05/06/2022)  Utilities: Not At Risk (05/06/2022)  Alcohol Screen: Medium Risk (08/04/2018)  Social Connections: Moderately Isolated (05/06/2022)  Tobacco Use: High Risk (08/28/2022)    Readmission Risk Interventions    04/28/2022    2:18 PM 04/07/2022   12:41 PM 07/27/2020   11:06 AM  Readmission Risk Prevention Plan  Transportation Screening Complete Complete Complete  PCP or Specialist Appt within 5-7 Days   Complete  Home Care Screening   Complete  HRI or Home Care Consult  Complete   Social Work Consult for [place] Planning/Counseling  Complete   Palliative Care Screening  Not Applicable   Medication Review Press photographer) Complete Complete   PCP or Specialist appointment within 3-5 days of discharge Complete    HRI or [place] Complete    SW Recovery Care/Counseling Consult Complete    Palliative Care Screening Not [place] Not Applicable     Reinaldo Raddle, RN, BSN  Trauma/Neuro ICU Case Manager [phone]

## 2022-08-29 NOTE — Plan of Care (Signed)
  Problem: Education: Goal: Knowledge of General Education information will improve Description: Including pain rating scale, medication(s)/side effects and non-pharmacologic comfort measures Outcome: Not Progressing   Problem: Health Behavior/Discharge Planning: Goal: Ability to manage health-related needs will improve Outcome: Not Progressing   Problem: Clinical Measurements: Goal: Ability to maintain clinical measurements within normal limits will improve 08/29/2022 0224 by Keturah Shavers, RN Outcome: Not Progressing 08/28/2022 2205 by Keturah Shavers, RN Outcome: Progressing Goal: Will remain free from infection 08/29/2022 0224 by Keturah Shavers, RN Outcome: Progressing 08/28/2022 2205 by Keturah Shavers, RN Outcome: Progressing Goal: Respiratory complications will improve Outcome: Progressing Goal: Cardiovascular complication will be avoided Outcome: Progressing

## 2022-08-30 LAB — GLUCOSE, CAPILLARY
Glucose-Capillary: 354 mg/dL — ABNORMAL HIGH (ref 70–99)
Glucose-Capillary: 59 mg/dL — ABNORMAL LOW (ref 70–99)
Glucose-Capillary: 59 mg/dL — ABNORMAL LOW (ref 70–99)
Glucose-Capillary: 231 mg/dL — ABNORMAL HIGH (ref 70–99)
Glucose-Capillary: 137 mg/dL — ABNORMAL HIGH (ref 70–99)
Glucose-Capillary: 138 mg/dL — ABNORMAL HIGH (ref 70–99)

## 2022-08-30 LAB — LIPID PANEL
Cholesterol: 117 mg/dL (ref 0–200)
HDL: 55 mg/dL (ref >40)
LDL Cholesterol: 48 mg/dL (ref 0–99)
Total CHOL/HDL Ratio: 2.1 RATIO
Triglycerides: 72 mg/dL (ref <150)
VLDL: 14 mg/dL (ref 0–40)

## 2022-08-30 LAB — TYPE AND SCREEN
ABO/RH(D): A POS
Antibody Screen: POSITIVE
Donor AG Type: NEGATIVE
Donor AG Type: NEGATIVE
Unit division: 0
Unit division: 0

## 2022-08-30 LAB — BPAM RBC
Blood Product Expiration Date: 202403282359
Blood Product Expiration Date: 202403312359
Unit Type and Rh: 6200
Unit Type and Rh: 6200

## 2022-08-30 MED ORDER — DOCUSATE SODIUM 100 MG PO CAPS
100.0000 mg | ORAL_CAPSULE | Freq: Two times a day (BID) | ORAL | Status: DC
  Administered 2022-08-30 – 2022-09-10 (×20): 100 mg via ORAL
  Filled 2022-08-30 (×22): qty 1

## 2022-08-30 MED ORDER — POLYETHYLENE GLYCOL 3350 17 G PO PACK
17.0000 g | PACK | Freq: Every day | ORAL | Status: DC
  Administered 2022-08-30 – 2022-08-31 (×2): 17 g via ORAL
  Filled 2022-08-30 (×2): qty 1

## 2022-08-30 MED ORDER — LORAZEPAM 0.5 MG PO TABS: 0.5000 mg | ORAL_TABLET | ORAL | Status: DC | PRN

## 2022-08-30 MED ORDER — INSULIN GLARGINE-YFGN 100 UNIT/ML ~~LOC~~ SOLN
8.0000 [IU] | Freq: Every day | SUBCUTANEOUS | Status: DC
  Administered 2022-08-30 – 2022-09-07 (×9): 8 [IU] via SUBCUTANEOUS
  Filled 2022-08-30 (×10): qty 0.08

## 2022-08-30 MED ORDER — LORAZEPAM 1 MG PO TABS
1.0000 mg | ORAL_TABLET | ORAL | Status: DC | PRN
  Administered 2022-08-30 – 2022-08-31 (×4): 1 mg via ORAL
  Filled 2022-08-30 (×4): qty 1

## 2022-08-30 MED ORDER — HALOPERIDOL LACTATE 5 MG/ML IJ SOLN
2.0000 mg | Freq: Four times a day (QID) | INTRAMUSCULAR | Status: DC | PRN
  Administered 2022-08-30 – 2022-09-09 (×12): 2 mg via INTRAVENOUS
  Filled 2022-08-30 (×14): qty 1
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
## 2022-08-30 NOTE — Progress Notes (Signed)
Inpatient Rehab Admissions:  Inpatient Rehab Consult received.  Attempted to meet with patient at the bedside for rehabilitation assessment and to discuss goals and expectations of an inpatient rehab admission.  Pt lethargic so NSG recommended talking with pt's daughter. Called pt's daughter [name] on the telephone. Discussed projected length of stay, insurance authorization requirement, and discharge home after completion of CIR. She acknowledged understanding. She is interested in pt pursuing CIR. She confirmed that her sister and uncle will be able to provide 24/7 support for pt after discharge. Will continue to follow pt's participation and progress with therapies.  Signed: Gayland Curry, [place], [place] Admissions Coordinator [phone]

## 2022-08-30 NOTE — Progress Notes (Signed)
OT Cancellation Note  Patient Details Name: [name] MRN: [id number] DOB: [birth year]   Cancelled Treatment:    Reason Eval/Treat Not Completed: Patient declined, no reason specified. Pt yelling out for everyone to leave her alone, stating she is going home and not working with anyone here. Per staff pt has been very agitated when messed with. OT will follow up as pt becomes more agreeable to therapy services.   Paulita Fujita, OTR/L [facility] [place] Almendariz Elane Yolanda Bonine 08/30/2022, 10:09 AM

## 2022-08-31 LAB — GLUCOSE, CAPILLARY
Glucose-Capillary: 151 mg/dL — ABNORMAL HIGH (ref 70–99)
Glucose-Capillary: 291 mg/dL — ABNORMAL HIGH (ref 70–99)
Glucose-Capillary: 212 mg/dL — ABNORMAL HIGH (ref 70–99)
Glucose-Capillary: 113 mg/dL — ABNORMAL HIGH (ref 70–99)

## 2022-08-31 MED ORDER — NICOTINE 7 MG/24HR TD PT24
7.0000 mg | MEDICATED_PATCH | Freq: Every day | TRANSDERMAL | Status: DC
  Administered 2022-08-31 – 2022-09-01 (×2): 7 mg via TRANSDERMAL
  Filled 2022-08-31 (×3): qty 1
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
## 2022-08-31 NOTE — Plan of Care (Signed)
  Problem: Education: Goal: Knowledge of General Education information will improve Description: Including pain rating scale, medication(s)/side effects and non-pharmacologic comfort measures Outcome: Progressing   Problem: Health Behavior/Discharge Planning: Goal: Ability to manage health-related needs will improve Outcome: Progressing   Problem: Clinical Measurements: Goal: Ability to maintain clinical measurements within normal limits will improve Outcome: Progressing Goal: Will remain free from infection Outcome: Progressing Goal: Diagnostic test results will improve Outcome: Progressing Goal: Respiratory complications will improve Outcome: Progressing Goal: Cardiovascular complication will be avoided Outcome: Progressing   Problem: Activity: Goal: Risk for activity intolerance will decrease Outcome: Progressing   Problem: Nutrition: Goal: Adequate nutrition will be maintained Outcome: Progressing   Problem: Coping: Goal: Level of anxiety will decrease Outcome: Progressing   Problem: Elimination: Goal: Will not experience complications related to bowel motility Outcome: Progressing Goal: Will not experience complications related to urinary retention Outcome: Progressing   Problem: Pain Managment: Goal: General experience of comfort will improve Outcome: Progressing   Problem: Safety: Goal: Ability to remain free from injury will improve Outcome: Progressing   Problem: Skin Integrity: Goal: Risk for impaired skin integrity will decrease Outcome: Progressing   Problem: Education: Goal: Ability to describe self-care measures that may prevent or decrease complications (Diabetes Survival Skills Education) will improve Outcome: Progressing Goal: Individualized Educational Video(s) Outcome: Progressing   Problem: Coping: Goal: Ability to adjust to condition or change in health will improve Outcome: Progressing   Problem: Fluid Volume: Goal: Ability to  maintain a balanced intake and output will improve Outcome: Progressing   Problem: Health Behavior/Discharge Planning: Goal: Ability to identify and utilize available resources and services will improve Outcome: Progressing Goal: Ability to manage health-related needs will improve Outcome: Progressing   Problem: Metabolic: Goal: Ability to maintain appropriate glucose levels will improve Outcome: Progressing   Problem: Nutritional: Goal: Maintenance of adequate nutrition will improve Outcome: Progressing Goal: Progress toward achieving an optimal weight will improve Outcome: Progressing   Problem: Skin Integrity: Goal: Risk for impaired skin integrity will decrease Outcome: Progressing   Problem: Tissue Perfusion: Goal: Adequacy of tissue perfusion will improve Outcome: Progressing   Problem: Safety: Goal: Non-violent Restraint(s) Outcome: Progressing   

## 2022-08-31 NOTE — Inpatient Diabetes Management (Signed)
Inpatient Diabetes Program Recommendations  AACE/ADA: New Consensus Statement on Inpatient Glycemic Control (2015)  Target Ranges:  Prepandial:   less than 140 mg/dL      Peak postprandial:   less than 180 mg/dL (1-2 hours)      Critically ill patients:  140 - 180 mg/dL   Lab Results  Component Value Date   GLUCAP 291 (H) 08/31/2022   HGBA1C 6.1 (H) 06/18/2022    Review of Glycemic Control  Diabetes history: DM2 Outpatient Diabetes medications: None Current orders for Inpatient glycemic control: Novolog 0-15 TID with meals and 0-5 HS, Semglee 8 units QD  HgbA1C - 6.1% Post-prandials elevated.  Inpatient Diabetes Program Recommendations:    Add Novolog 3 units TID with meals if eating > 50%  Continue to follow closely.  Thank you. Lorenda Peck, RD, LDN, [place] Inpatient Diabetes Coordinator [phone]

## 2022-08-31 NOTE — Progress Notes (Signed)
   08/31/22 0450  Assess: MEWS Score  Temp 98 F (36.7 C)  BP 92/74  MAP (mmHg) 80  Pulse Rate (!) 119  Resp 18  Level of Consciousness Alert  SpO2 96 %  O2 Device Room Air  Assess: MEWS Score  MEWS Temp 0  MEWS Systolic 1  MEWS Pulse 2  MEWS RR 0  MEWS LOC 0  MEWS Score 3  MEWS Score Color Yellow  Assess: if the MEWS score is Yellow or Red  Were vital signs taken at a resting state? Yes  Focused Assessment Change from prior assessment (see assessment flowsheet)  Does the patient meet 2 or more of the SIRS criteria? No  MEWS guidelines implemented  Yes, yellow  Treat  MEWS Interventions Considered administering scheduled or prn medications/treatments as ordered  Take Vital Signs  Increase Vital Sign Frequency  Yellow: Q2hr x1, continue Q4hrs until patient remains green for 12hrs  Escalate  MEWS: Escalate Yellow: Discuss with charge nurse and consider notifying provider and/or RRT  Notify: Charge Nurse/RN  Name of Charge Nurse/RN Notified Dauda, RN  Provider Notification  Provider Name/Title Zenia Resides, MD  Date Provider Notified 08/31/22  Time Provider Notified 0507  Method of Notification Page  Notification Reason Change in status;Other (Comment) (Yellow MEWS)  Provider response No new orders  Date of Provider Response 08/31/22  Time of Provider Response [phone]  Assess: SIRS CRITERIA  SIRS Temperature  0  SIRS Pulse 1  SIRS Respirations  0  SIRS WBC 0  SIRS Score Sum  1
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
## 2022-09-01 LAB — CBC
HCT: 29 % — ABNORMAL LOW (ref 36.0–46.0)
Hemoglobin: 9.2 g/dL — ABNORMAL LOW (ref 12.0–15.0)
MCH: 27.8 pg (ref 26.0–34.0)
MCHC: 31.7 g/dL (ref 30.0–36.0)
MCV: 87.6 fL (ref 80.0–100.0)
Platelets: 359 10*3/uL (ref 150–400)
RBC: 3.31 MIL/uL — ABNORMAL LOW (ref 3.87–5.11)
RDW: 15.9 % — ABNORMAL HIGH (ref 11.5–15.5)
WBC: 6.7 10*3/uL (ref 4.0–10.5)
nRBC: 0 % (ref 0.0–0.2)

## 2022-09-01 LAB — GLUCOSE, CAPILLARY
Glucose-Capillary: 187 mg/dL — ABNORMAL HIGH (ref 70–99)
Glucose-Capillary: 168 mg/dL — ABNORMAL HIGH (ref 70–99)
Glucose-Capillary: 156 mg/dL — ABNORMAL HIGH (ref 70–99)
Glucose-Capillary: 88 mg/dL (ref 70–99)

## 2022-09-01 MED ORDER — NICOTINE 21 MG/24HR TD PT24
21.0000 mg | MEDICATED_PATCH | Freq: Every day | TRANSDERMAL | Status: DC
  Administered 2022-09-02 – 2022-09-10 (×9): 21 mg via TRANSDERMAL
  Filled 2022-09-01 (×9): qty 1

## 2022-09-01 MED ORDER — METHOCARBAMOL 500 MG PO TABS
1000.0000 mg | ORAL_TABLET | Freq: Three times a day (TID) | ORAL | Status: DC
  Administered 2022-09-01 – 2022-09-02 (×4): 1000 mg via ORAL
  Filled 2022-09-01 (×4): qty 2

## 2022-09-01 MED ORDER — POLYETHYLENE GLYCOL 3350 17 G PO PACK
17.0000 g | PACK | Freq: Two times a day (BID) | ORAL | Status: DC
  Administered 2022-09-01 – 2022-09-08 (×8): 17 g via ORAL
  Filled 2022-09-01 (×16): qty 1

## 2022-09-01 MED ORDER — INSULIN ASPART 100 UNIT/ML IJ SOLN
3.0000 [IU] | Freq: Three times a day (TID) | INTRAMUSCULAR | Status: DC
  Administered 2022-09-01 – 2022-09-09 (×21): 3 [IU] via SUBCUTANEOUS

## 2022-09-01 MED ORDER — ONDANSETRON HCL 4 MG/2ML IJ SOLN: 4.0000 mg | Freq: Four times a day (QID) | INTRAMUSCULAR | Status: DC | PRN

## 2022-09-01 MED ORDER — OXYCODONE HCL 5 MG PO TABS
10.0000 mg | ORAL_TABLET | ORAL | Status: DC | PRN
  Administered 2022-09-01 – 2022-09-04 (×14): 15 mg via ORAL
  Filled 2022-09-01 (×14): qty 3

## 2022-09-01 MED ORDER — NICOTINE POLACRILEX 2 MG MT GUM: 2.0000 mg | CHEWING_GUM | OROMUCOSAL | Status: DC | PRN
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
## 2022-09-01 NOTE — Progress Notes (Signed)
Inpatient Rehab Admissions Coordinator:  Spoke with pt's daughter [name]. Discussed will follow pt's participation and progress with therapies for a few sessions.  Insurance authorization started.  Will continue to follow.   Gayland Curry, [place], [place] Admissions Coordinator [phone]
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
## 2022-09-02 LAB — LIPID PANEL
Cholesterol: 115 mg/dL (ref 0–200)
HDL: 50 mg/dL (ref >40)
LDL Cholesterol: 50 mg/dL (ref 0–99)
Total CHOL/HDL Ratio: 2.3 RATIO
Triglycerides: 73 mg/dL (ref <150)
VLDL: 15 mg/dL (ref 0–40)

## 2022-09-02 LAB — GLUCOSE, CAPILLARY
Glucose-Capillary: 174 mg/dL — ABNORMAL HIGH (ref 70–99)
Glucose-Capillary: 131 mg/dL — ABNORMAL HIGH (ref 70–99)
Glucose-Capillary: 168 mg/dL — ABNORMAL HIGH (ref 70–99)
Glucose-Capillary: 95 mg/dL (ref 70–99)

## 2022-09-02 MED ORDER — CHLORHEXIDINE GLUCONATE CLOTH 2 % EX PADS
6.0000 | MEDICATED_PAD | Freq: Every day | CUTANEOUS | Status: AC
  Administered 2022-09-02 – 2022-09-06 (×5): 6 via TOPICAL

## 2022-09-02 MED ORDER — ZONISAMIDE 100 MG PO CAPS
200.0000 mg | ORAL_CAPSULE | Freq: Every day | ORAL | Status: DC
  Administered 2022-09-02 – 2022-09-09 (×8): 200 mg via ORAL
  Filled 2022-09-02 (×9): qty 2

## 2022-09-02 MED ORDER — TRAMADOL HCL 50 MG PO TABS
50.0000 mg | ORAL_TABLET | Freq: Four times a day (QID) | ORAL | Status: DC
  Administered 2022-09-02 – 2022-09-03 (×3): 50 mg via ORAL
  Filled 2022-09-02 (×3): qty 1

## 2022-09-02 MED ORDER — MUPIROCIN 2 % EX OINT
1.0000 | TOPICAL_OINTMENT | Freq: Two times a day (BID) | CUTANEOUS | Status: AC
  Administered 2022-09-02 – 2022-09-06 (×9): 1 via NASAL
  Filled 2022-09-02 (×4): qty 22

## 2022-09-02 MED ORDER — LORAZEPAM 2 MG/ML IJ SOLN
1.0000 mg | Freq: Once | INTRAMUSCULAR | Status: AC | PRN
  Administered 2022-09-08: 1 mg via INTRAVENOUS
  Filled 2022-09-02: qty 1

## 2022-09-02 MED ORDER — METHOCARBAMOL 500 MG PO TABS
1000.0000 mg | ORAL_TABLET | Freq: Four times a day (QID) | ORAL | Status: DC
  Administered 2022-09-02 – 2022-09-03 (×7): 1000 mg via ORAL
  Filled 2022-09-02 (×8): qty 2

## 2022-09-02 NOTE — Progress Notes (Signed)
Trauma Event Note    Continues to c/o ace bandage on right leg malpositioned after she "messed with it." Ortho tech Manny notified, will see pt.   Salik Grewell O Darlis Wragg  Trauma Response RN  Please call TRN at [phone] for further assistance.

## 2022-09-02 NOTE — Progress Notes (Signed)
Inpatient Rehabilitation Admissions Coordinator   Dr Naaman Plummer to complete peer to peer as requested by [facility] [place] MD request by 1030 3/13.  Danne Baxter, RN, MSN Rehab Admissions Coordinator [phone] 09/02/2022 4:44 PM
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
## 2022-09-02 NOTE — Progress Notes (Signed)
Inpatient Rehabilitation Admissions Coordinator   I met at bedside with patient , therapy and safety sitter. I await insurance determination for a possible CIR admit.  Danne Baxter, RN, MSN Rehab Admissions Coordinator [phone] 09/02/2022 11:25 AM
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
## 2022-09-02 NOTE — Progress Notes (Signed)
Per neuro MD, pt to be administered with prn ativan for seizure lasting more than 3 minutes

## 2022-09-03 ENCOUNTER — Inpatient Hospital Stay (HOSPITAL_COMMUNITY): Payer: 59

## 2022-09-03 LAB — GLUCOSE, CAPILLARY
Glucose-Capillary: 204 mg/dL — ABNORMAL HIGH (ref 70–99)
Glucose-Capillary: 104 mg/dL — ABNORMAL HIGH (ref 70–99)
Glucose-Capillary: 153 mg/dL — ABNORMAL HIGH (ref 70–99)
Glucose-Capillary: 166 mg/dL — ABNORMAL HIGH (ref 70–99)
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
## 2022-09-03 NOTE — Progress Notes (Signed)
Inpatient Rehabilitation Admissions Coordinator   We have received a denial for CIR admit after peer to peer with Dr Naaman Plummer. I contacted pt's daughter, [name], by phone and she is aware. She would like to pursue appeal. I will meet with patient and explain to her the process. When patient goes home, she will go home with her other daughter who has a ramp access into her home.[name] is also requesting that therapy work with her on 5 steps as that will eventually be how patient accesses her own home. Acute team and TOC made aware.  Danne Baxter, RN, MSN Rehab Admissions Coordinator [phone] 09/03/2022 12:20 PM
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
## 2022-09-03 NOTE — Progress Notes (Signed)
Orthopedic Tech Progress Note Patient Details:  JONESHIA MARCOS 1966-02-02 [id number]  Patient ID: [name], female   DOB: 07/24/1965, 57 y.o.   MRN: [id number] [facility] clinic representative made aware of issues with ROM knee brace and will take care of it on 3/14. Vernona Rieger 09/03/2022, 5:50 PM
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
## 2022-09-03 NOTE — TOC Progression Note (Signed)
Transition of Care [facility]) - Progression Note    Patient Details  Name: [name] MRN: [id number] Date of Birth: [birth year]  [facility]) CM/SW Contact  Ella Bodo, RN Phone Number: 09/03/2022, 4:25 PM  Clinical Narrative:    Denial received after P2P with Dr. Naaman Plummer; daughter would like to pursue appeal.  Will follow for appeal outcome.  If denied, will plan for home discharge with home health services.  Daughter has faxed completed application for PCS services at home.  Will follow progress.    Expected Discharge Plan: [place] Barriers to Discharge: Continued Medical Work up  Expected Discharge Plan and Services   Discharge Planning Services: CM Consult   Living arrangements for the past 2 months: [place] Determinants of Health (SDOH) Interventions [place]: No Food Insecurity (05/06/2022)  Housing: Low Risk  (05/06/2022)  Transportation Needs: No Transportation Needs (05/06/2022)  Utilities: Not At Risk (05/06/2022)  Alcohol Screen: Medium Risk (08/04/2018)  Social Connections: Moderately Isolated (05/06/2022)  Tobacco Use: High Risk (08/28/2022)    Readmission Risk Interventions    04/28/2022    2:18 PM 04/07/2022   12:41 PM 07/27/2020   11:06 AM  Readmission Risk Prevention Plan  Transportation Screening Complete Complete Complete  PCP or Specialist Appt within 5-7 Days   Complete  Home Care Screening   Complete  HRI or Home Care Consult  Complete   Social Work Consult for [place] Planning/Counseling  Complete   Palliative Care Screening  Not Applicable   Medication Review Press photographer) Complete Complete   PCP or Specialist appointment within 3-5 days of discharge Complete    HRI or [place] Complete    SW Recovery Care/Counseling Consult Complete    Palliative Care Screening Not [place] Not  Applicable     Reinaldo Raddle, RN, BSN  Trauma/Neuro ICU Case Manager [phone]

## 2022-09-03 NOTE — Progress Notes (Signed)
Brief Neuro Note:  Briefly stopped by to see Ms. [name] and spoke with bedside RN too. No seizures since increasing zonegran. She is up and walking in the room.  Spoke to her again about seizure precautions. Patient has no questions.  We will signoff. Follow up with neurology outpatient. Seizure precautions below have been pasted in the discharge instructions.       Seizure precautions: Per [facility] statutes, patients with seizures are not allowed to drive until they have been seizure-free for six months and cleared by a physician    Use caution when using heavy equipment or power tools. Avoid working on ladders or at heights. Take showers instead of baths. Ensure the water temperature is not too high on the home water heater. Do not go swimming alone. Do not lock yourself in a room alone (i.e. bathroom). When caring for infants or small children, sit down when holding, feeding, or changing them to minimize risk of injury to the child in the event you have a seizure. Maintain good sleep hygiene. Avoid alcohol.    If patient has another seizure, call 911 and bring them back to the ED if: A.  The seizure lasts longer than 5 minutes.      B.  The patient doesn't wake shortly after the seizure or has new problems such as difficulty seeing, speaking or moving following the seizure C.  The patient was injured during the seizure D.  The patient has a temperature over 102 F (39C) E.  The patient vomited during the seizure and now is having trouble breathing    During the Seizure   - First, ensure adequate ventilation and place patients on the floor on their left side  Loosen clothing around the neck and ensure the airway is patent. If the patient is clenching the teeth, do not force the mouth open with any object as this can cause severe damage - Remove all items from the surrounding that can be hazardous. The patient may be oblivious to what's happening and may not even know  what he or she is doing. If the patient is confused and wandering, either gently guide him/her away and block access to outside areas - Reassure the individual and be comforting - Call 911. In most cases, the seizure ends before EMS arrives. However, there are cases when seizures may last over 3 to 5 minutes. Or the individual may have developed breathing difficulties or severe injuries. If a pregnant patient or a person with diabetes develops a seizure, it is prudent to call an ambulance. - Finally, if the patient does not regain full consciousness, then call EMS. Most patients will remain confused for about 45 to 90 minutes after a seizure, so you must use judgment in calling for help. - Avoid restraints but make sure the patient is in a bed with padded side rails - Place the individual in a lateral position with the neck slightly flexed; this will help the saliva drain from the mouth and prevent the tongue from falling backward - Remove all nearby furniture and other hazards from the area - Provide verbal assurance as the individual is regaining consciousness - Provide the patient with privacy if possible - Call for help and start treatment as ordered by the caregiver    After the Seizure (Postictal Stage)   After a seizure, most patients experience confusion, fatigue, muscle pain and/or a headache. Thus, one should permit the individual to sleep. For the next few  days, reassurance is essential. Being calm and helping reorient the person is also of importance.   Most seizures are painless and end spontaneously. Seizures are not harmful to others but can lead to complications such as stress on the lungs, brain and the heart. Individuals with prior lung problems may develop labored breathing and respiratory distress.

## 2022-09-03 NOTE — Progress Notes (Signed)
Patient c/o pain on thigh and leg coming from her knee brace.Stated "something is poking me." She continues to try and manipulate the straps. Ortho tech called and states she will call rep.  Haydee Salter, RN
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
## 2022-09-04 LAB — GLUCOSE, CAPILLARY
Glucose-Capillary: 265 mg/dL — ABNORMAL HIGH (ref 70–99)
Glucose-Capillary: 87 mg/dL (ref 70–99)
Glucose-Capillary: 162 mg/dL — ABNORMAL HIGH (ref 70–99)
Glucose-Capillary: 176 mg/dL — ABNORMAL HIGH (ref 70–99)

## 2022-09-04 MED ORDER — OXYCODONE HCL 5 MG PO TABS: 10.0000 mg | ORAL_TABLET | ORAL | Status: DC | PRN

## 2022-09-04 MED ORDER — OXYCODONE HCL ER 10 MG PO T12A
10.0000 mg | EXTENDED_RELEASE_TABLET | Freq: Two times a day (BID) | ORAL | Status: DC
  Administered 2022-09-04 – 2022-09-08 (×9): 10 mg via ORAL
  Filled 2022-09-04 (×9): qty 1

## 2022-09-04 MED ORDER — OXYCODONE HCL 5 MG PO TABS
10.0000 mg | ORAL_TABLET | ORAL | Status: DC | PRN
  Administered 2022-09-04 – 2022-09-10 (×22): 15 mg via ORAL
  Filled 2022-09-04 (×22): qty 3

## 2022-09-04 MED ORDER — HYDROMORPHONE HCL 1 MG/ML IJ SOLN
0.5000 mg | Freq: Once | INTRAMUSCULAR | Status: AC
  Administered 2022-09-04: 0.5 mg via INTRAVENOUS
  Filled 2022-09-04: qty 0.5

## 2022-09-04 MED ORDER — METHOCARBAMOL 500 MG PO TABS
1000.0000 mg | ORAL_TABLET | Freq: Four times a day (QID) | ORAL | Status: DC
  Administered 2022-09-04 – 2022-09-10 (×25): 1000 mg via ORAL
  Filled 2022-09-04 (×24): qty 2
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
## 2022-09-04 NOTE — TOC Progression Note (Signed)
Transition of Care [facility]) - Progression Note    Patient Details  Name: [name] MRN: [id number] Date of Birth: 03/04/1966  [facility] CM/SW Contact  Ella Bodo, RN Phone Number: 09/04/2022, 4:14PM Clinical Narrative:    Insurance appeal in progress for CIR.  Patient with periods of agitation, but redirects fairly easily.  As patient likely does not have capacity to make her own decisions, may need IVC if attempts to leave.  Trauma PA to contact psych provider to discuss.     Expected Discharge Plan: [place] Barriers to Discharge: Continued Medical Work up  Expected Discharge Plan and Services   Discharge Planning Services: CM Consult   Living arrangements for the past 2 months: [place] Determinants of Health (SDOH) Interventions [place]: No Food Insecurity (05/06/2022)  Housing: Low Risk  (05/06/2022)  Transportation Needs: No Transportation Needs (05/06/2022)  Utilities: Not At Risk (05/06/2022)  Alcohol Screen: Medium Risk (08/04/2018)  Social Connections: Moderately Isolated (05/06/2022)  Tobacco Use: High Risk (08/28/2022)    Readmission Risk Interventions    04/28/2022    2:18 PM 04/07/2022   12:41 PM 07/27/2020   11:06 AM  Readmission Risk Prevention Plan  Transportation Screening Complete Complete Complete  PCP or Specialist Appt within 5-7 Days   Complete  Home Care Screening   Complete  HRI or Home Care Consult  Complete   Social Work Consult for [place] Planning/Counseling  Complete   Palliative Care Screening  Not Applicable   Medication Review Press photographer) Complete Complete   PCP or Specialist appointment within 3-5 days of discharge Complete    HRI or [place] Complete    SW Recovery Care/Counseling Consult Complete    Palliative Care Screening Not [place] Not Applicable      Reinaldo Raddle, RN, BSN  Trauma/Neuro ICU Case Manager [phone]
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
## 2022-09-04 NOTE — Inpatient Diabetes Management (Signed)
Inpatient Diabetes Program Recommendations  AACE/ADA: New Consensus Statement on Inpatient Glycemic Control (2015)  Target Ranges:  Prepandial:   less than 140 mg/dL      Peak postprandial:   less than 180 mg/dL (1-2 hours)      Critically ill patients:  140 - 180 mg/dL   Lab Results  Component Value Date   GLUCAP 87 09/04/2022   HGBA1C 6.1 (H) 06/18/2022    Review of Glycemic Control  Latest Reference Range & Units 09/03/22 07:26 09/03/22 11:37 09/03/22 17:12 09/03/22 20:08 09/04/22 07:41 09/04/22 11:35  Glucose-Capillary 70 - 99 mg/dL 204 (H) 104 (H) 153 (H) 166 (H) 265 (H) 87   Diabetes history: DM 2 Outpatient Diabetes medications: none Current orders for Inpatient glycemic control:  Novolog 3 units tid meal coverage Novolog 0-15 units tid + hs Semglee 8 units Daily  Fasting glucose > 200  Inpatient Diabetes Program Recommendations:    -   Increase Semlgee dose slightly to 10 units  Thanks,  Tama Headings RN, MSN, BC-ADM Inpatient Diabetes Coordinator Team Pager [phone] (8a-5p)

## 2022-09-04 NOTE — Plan of Care (Signed)
  Problem: Clinical Measurements: Goal: Respiratory complications will improve Outcome: Progressing Goal: Cardiovascular complication will be avoided Outcome: Progressing   Problem: Nutrition: Goal: Adequate nutrition will be maintained Outcome: Progressing   Problem: Coping: Goal: Level of anxiety will decrease Outcome: Not Progressing
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
## 2022-09-05 LAB — GLUCOSE, CAPILLARY
Glucose-Capillary: 220 mg/dL — ABNORMAL HIGH (ref 70–99)
Glucose-Capillary: 77 mg/dL (ref 70–99)
Glucose-Capillary: 233 mg/dL — ABNORMAL HIGH (ref 70–99)
Glucose-Capillary: 177 mg/dL — ABNORMAL HIGH (ref 70–99)

## 2022-09-05 MED ORDER — MELATONIN 3 MG PO TABS
3.0000 mg | ORAL_TABLET | Freq: Every day | ORAL | Status: DC
  Administered 2022-09-05 – 2022-09-09 (×5): 3 mg via ORAL
  Filled 2022-09-05 (×5): qty 1

## 2022-09-05 NOTE — Progress Notes (Signed)
Inpatient Rehabilitation Admissions Coordinator   I continue to await appeal determination for CIR admit.  Danne Baxter, RN, MSN Rehab Admissions Coordinator [phone] 09/05/2022 12:41 PM
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
## 2022-09-06 LAB — GLUCOSE, CAPILLARY
Glucose-Capillary: 232 mg/dL — ABNORMAL HIGH (ref 70–99)
Glucose-Capillary: 215 mg/dL — ABNORMAL HIGH (ref 70–99)
Glucose-Capillary: 93 mg/dL (ref 70–99)
Glucose-Capillary: 93 mg/dL (ref 70–99)

## 2022-09-06 MED ORDER — HYDROMORPHONE HCL 1 MG/ML IJ SOLN
1.0000 mg | Freq: Once | INTRAMUSCULAR | Status: AC
  Administered 2022-09-06: 1 mg via INTRAVENOUS
  Filled 2022-09-06: qty 1
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
## 2022-09-06 NOTE — Progress Notes (Signed)
Patient went to sleep around 12 midnight till 0540. Did not wake patient up for her midnight medications. Patient was given her 0600 medication, will continue to monitor.

## 2022-09-07 LAB — GLUCOSE, CAPILLARY
Glucose-Capillary: 93 mg/dL (ref 70–99)
Glucose-Capillary: 103 mg/dL — ABNORMAL HIGH (ref 70–99)
Glucose-Capillary: 166 mg/dL — ABNORMAL HIGH (ref 70–99)
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
## 2022-09-07 NOTE — Plan of Care (Signed)
  Problem: Education: Goal: Knowledge of General Education information will improve Description: Including pain rating scale, medication(s)/side effects and non-pharmacologic comfort measures Outcome: Progressing   Problem: Health Behavior/Discharge Planning: Goal: Ability to manage health-related needs will improve Outcome: Progressing   Problem: Clinical Measurements: Goal: Will remain free from infection Outcome: Progressing Goal: Diagnostic test results will improve Outcome: Progressing Goal: Respiratory complications will improve Outcome: Progressing   Problem: Nutrition: Goal: Adequate nutrition will be maintained Outcome: Progressing   Problem: Coping: Goal: Level of anxiety will decrease Outcome: Progressing   Problem: Elimination: Goal: Will not experience complications related to bowel motility Outcome: Progressing   Problem: Pain Managment: Goal: General experience of comfort will improve Outcome: Progressing   Problem: Safety: Goal: Ability to remain free from injury will improve Outcome: Progressing   

## 2022-09-08 LAB — GLUCOSE, CAPILLARY
Glucose-Capillary: 209 mg/dL — ABNORMAL HIGH (ref 70–99)
Glucose-Capillary: 109 mg/dL — ABNORMAL HIGH (ref 70–99)
Glucose-Capillary: 176 mg/dL — ABNORMAL HIGH (ref 70–99)
Glucose-Capillary: 75 mg/dL (ref 70–99)

## 2022-09-08 MED ORDER — INSULIN GLARGINE-YFGN 100 UNIT/ML ~~LOC~~ SOLN
10.0000 [IU] | Freq: Every day | SUBCUTANEOUS | Status: DC
  Administered 2022-09-08 – 2022-09-09 (×2): 10 [IU] via SUBCUTANEOUS
  Filled 2022-09-08 (×3): qty 0.1

## 2022-09-08 MED ORDER — OXYCODONE HCL ER 15 MG PO T12A
15.0000 mg | EXTENDED_RELEASE_TABLET | Freq: Two times a day (BID) | ORAL | Status: DC
  Administered 2022-09-08 – 2022-09-10 (×4): 15 mg via ORAL
  Filled 2022-09-08 (×4): qty 1

## 2022-09-08 MED ORDER — ENSURE ENLIVE PO LIQD
237.0000 mL | Freq: Two times a day (BID) | ORAL | Status: DC
  Administered 2022-09-09 – 2022-09-10 (×3): 237 mL via ORAL
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
## 2022-09-08 NOTE — TOC Progression Note (Signed)
Transition of Care [facility]) - Progression Note    Patient Details  Name: [name] MRN: [id number] Date of Birth: 07-27-1965  Transition of [facility]) CM/SW Contact  Ella Bodo, RN Phone Number: 09/08/2022, 5:16 PM  Clinical Narrative:    Provided additional letter to patient's daughter, [name].  Patient has court date on 09/10/2022, and needed letter for her lawyers.     Expected Discharge Plan: [place] Barriers to Discharge: Continued Medical Work up  Expected Discharge Plan and Services   Discharge Planning Services: CM Consult   Living arrangements for the past 2 months: [place] Determinants of Health (SDOH) Interventions [place]: No Food Insecurity (05/06/2022)  Housing: Low Risk  (05/06/2022)  Transportation Needs: No Transportation Needs (05/06/2022)  Utilities: Not At Risk (05/06/2022)  Alcohol Screen: Medium Risk (08/04/2018)  Social Connections: Moderately Isolated (05/06/2022)  Tobacco Use: High Risk (08/28/2022)    Readmission Risk Interventions    04/28/2022    2:18 PM 04/07/2022   12:41 PM 07/27/2020   11:06 AM  Readmission Risk Prevention Plan  Transportation Screening Complete Complete Complete  PCP or Specialist Appt within 5-7 Days   Complete  Home Care Screening   Complete  HRI or Home Care Consult  Complete   Social Work Consult for [place] Planning/Counseling  Complete   Palliative Care Screening  Not Applicable   Medication Review Press photographer) Complete Complete   PCP or Specialist appointment within 3-5 days of discharge Complete    HRI or [place] Complete    SW Recovery Care/Counseling Consult Complete    Palliative Care Screening Not [place] Not Applicable     Reinaldo Raddle, RN, BSN  Trauma/Neuro ICU Case Manager [phone]

## 2022-09-08 NOTE — Progress Notes (Addendum)
Inpatient Rehab Admissions Coordinator:   Addendum: updated clinicals sent to insurance today at 13:19 via fax.   Insurance company was contacted this am to follow up on appeal for admission to CIR. Apparently insurance voided the appeal and we were not notified. An new appeal will be started today once updated therapy notes are in chart. Daughter, [name] was notified and in agreement. Will continue to follow for potential admission pending insurance decision.   Rehab Admissons Coordinator [place], [place], [place] [phone]
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
## 2022-09-09 ENCOUNTER — Inpatient Hospital Stay (HOSPITAL_COMMUNITY): Payer: 59

## 2022-09-09 LAB — GLUCOSE, CAPILLARY
Glucose-Capillary: 127 mg/dL — ABNORMAL HIGH (ref 70–99)
Glucose-Capillary: 146 mg/dL — ABNORMAL HIGH (ref 70–99)
Glucose-Capillary: 166 mg/dL — ABNORMAL HIGH (ref 70–99)
Glucose-Capillary: 162 mg/dL — ABNORMAL HIGH (ref 70–99)
Glucose-Capillary: 205 mg/dL — ABNORMAL HIGH (ref 70–99)
Glucose-Capillary: 103 mg/dL — ABNORMAL HIGH (ref 70–99)
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
## 2022-09-09 NOTE — Progress Notes (Signed)
Inpatient Rehabilitation Admissions Coordinator   I discussed her progress with Dr Naaman Plummer. Patient no longer in need of CIR level rehab. We recommend discharge home with family and HH. I contacted daughter, [name] and she is aware and in agreement. She asks that either therapy or Nursing review caregiver needs with her sister today upon her arrival . I have updated acute team and TOC. We will sign off at this time.  Danne Baxter, RN, MSN Rehab Admissions Coordinator [phone] 09/09/2022 11:35 AM
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
## 2022-09-09 NOTE — Plan of Care (Signed)
  Problem: Education: Goal: Knowledge of General Education information will improve Description: Including pain rating scale, medication(s)/side effects and non-pharmacologic comfort measures Outcome: Progressing   Problem: Health Behavior/Discharge Planning: Goal: Ability to manage health-related needs will improve Outcome: Progressing   Problem: Clinical Measurements: Goal: Ability to maintain clinical measurements within normal limits will improve Outcome: Progressing Goal: Will remain free from infection Outcome: Progressing Goal: Diagnostic test results will improve Outcome: Progressing Goal: Respiratory complications will improve Outcome: Progressing Goal: Cardiovascular complication will be avoided Outcome: Progressing   Problem: Activity: Goal: Risk for activity intolerance will decrease Outcome: Progressing   Problem: Nutrition: Goal: Adequate nutrition will be maintained Outcome: Progressing   Problem: Coping: Goal: Level of anxiety will decrease Outcome: Progressing   Problem: Elimination: Goal: Will not experience complications related to bowel motility Outcome: Progressing Goal: Will not experience complications related to urinary retention Outcome: Progressing   Problem: Pain Managment: Goal: General experience of comfort will improve Outcome: Progressing   Problem: Safety: Goal: Ability to remain free from injury will improve Outcome: Progressing   Problem: Skin Integrity: Goal: Risk for impaired skin integrity will decrease Outcome: Progressing   Problem: Education: Goal: Ability to describe self-care measures that may prevent or decrease complications (Diabetes Survival Skills Education) will improve Outcome: Progressing Goal: Individualized Educational Video(s) Outcome: Progressing   Problem: Coping: Goal: Ability to adjust to condition or change in health will improve Outcome: Progressing   Problem: Fluid Volume: Goal: Ability to  maintain a balanced intake and output will improve Outcome: Progressing   Problem: Health Behavior/Discharge Planning: Goal: Ability to identify and utilize available resources and services will improve Outcome: Progressing Goal: Ability to manage health-related needs will improve Outcome: Progressing   Problem: Metabolic: Goal: Ability to maintain appropriate glucose levels will improve Outcome: Progressing   Problem: Nutritional: Goal: Maintenance of adequate nutrition will improve Outcome: Progressing Goal: Progress toward achieving an optimal weight will improve Outcome: Progressing   Problem: Skin Integrity: Goal: Risk for impaired skin integrity will decrease Outcome: Progressing   Problem: Tissue Perfusion: Goal: Adequacy of tissue perfusion will improve Outcome: Progressing   Problem: Safety: Goal: Non-violent Restraint(s) Outcome: Progressing   
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
## 2022-09-09 NOTE — TOC Progression Note (Signed)
Transition of Care [facility]) - Progression Note    Patient Details  Name: [name] MRN: [id number] Date of Birth: November 29, 1965  [facility] CM/SW Contact  Ella Bodo, RN Phone Number: 09/09/2022, 4:56 PM  Clinical Narrative:    CIR has declined patient for admission, as she no longer has rehab needs.  Patient able to discharge safely with family to assist with care.  She plans to dc home with her daughter to provide needed assistance.  Referral to [place] for RW, BSC, and shower seat, to be delivered to bedside prior to dc.  Attempted to arrange [facility] services for patient, but agencies unwilling to accept with current ETOH and drug use.  Spoke with patient's daughter, [name]: she is agreeable to trying OP therapy.  Will make referral to [place] on [facility] for continued therapies.  Patient's other daughter to come in tomorrow for teaching on transfers and therapy techniques.     Expected Discharge Plan: OP Rehab Barriers to Discharge: Continued Medical Work up  Expected Discharge Plan and Services   Discharge Planning Services: CM Consult   Living arrangements for the past 2 months: Single Family Home                 DME Arranged: Walker rolling, Bedside commode, Shower stool DME Agency: [employer] Date DME Agency Contacted: 09/09/22 Time DME Agency Contacted: [id number] Representative spoke with at DME Agency: [place] Determinants of Health ([place]) Interventions [place]: No Food Insecurity (05/06/2022)  Housing: Low Risk  (05/06/2022)  Transportation Needs: No Transportation Needs (05/06/2022)  Utilities: Not At Risk (05/06/2022)  Alcohol Screen: Medium Risk (08/04/2018)  Social Connections: Moderately Isolated (05/06/2022)  Tobacco Use: High Risk (08/28/2022)    Readmission Risk Interventions    04/28/2022    2:18 PM 04/07/2022   12:41 PM 07/27/2020   11:06 AM  Readmission Risk Prevention Plan   Transportation Screening Complete Complete Complete  PCP or Specialist Appt within 5-7 Days   Complete  Home Care Screening   Complete  HRI or Home Care Consult  Complete   Social Work Consult for [place] Planning/Counseling  Complete   Palliative Care Screening  Not Applicable   Medication Review Press photographer) Complete Complete   PCP or Specialist appointment within 3-5 days of discharge Complete    HRI or [place] Complete    SW Recovery Care/Counseling Consult Complete    Palliative Care Screening Not [place] Not Applicable     Reinaldo Raddle, RN, BSN  Trauma/Neuro ICU Case Manager [phone]

## 2022-09-09 NOTE — Plan of Care (Signed)
  Problem: Education: Goal: Knowledge of General Education information will improve Description: Including pain rating scale, medication(s)/side effects and non-pharmacologic comfort measures 09/09/2022 0120 by Jule Ser, RN Outcome: Progressing 09/09/2022 0120 by Jule Ser, RN Outcome: Progressing   Problem: Health Behavior/Discharge Planning: Goal: Ability to manage health-related needs will improve 09/09/2022 0120 by Jule Ser, RN Outcome: Progressing 09/09/2022 0120 by Jule Ser, RN Outcome: Progressing   Problem: Clinical Measurements: Goal: Ability to maintain clinical measurements within normal limits will improve 09/09/2022 0120 by Jule Ser, RN Outcome: Progressing 09/09/2022 0120 by Jule Ser, RN Outcome: Progressing Goal: Will remain free from infection 09/09/2022 0120 by Jule Ser, RN Outcome: Progressing 09/09/2022 0120 by Jule Ser, RN Outcome: Progressing Goal: Diagnostic test results will improve 09/09/2022 0120 by Jule Ser, RN Outcome: Progressing 09/09/2022 0120 by Jule Ser, RN Outcome: Progressing Goal: Respiratory complications will improve 09/09/2022 0120 by Jule Ser, RN Outcome: Progressing 09/09/2022 0120 by Jule Ser, RN Outcome: Progressing Goal: Cardiovascular complication will be avoided 09/09/2022 0120 by Jule Ser, RN Outcome: Progressing 09/09/2022 0120 by Jule Ser, RN Outcome: Progressing   Problem: Activity: Goal: Risk for activity intolerance will decrease 09/09/2022 0120 by Jule Ser, RN Outcome: Progressing 09/09/2022 0120 by Jule Ser, RN Outcome: Progressing   Problem: Nutrition: Goal: Adequate nutrition will be maintained 09/09/2022 0120 by Jule Ser, RN Outcome: Progressing 09/09/2022 0120 by Jule Ser, RN Outcome: Progressing   Problem: Coping: Goal: Level of anxiety will decrease Outcome: Progressing    Problem: Elimination: Goal: Will not experience complications related to bowel motility Outcome: Progressing Goal: Will not experience complications related to urinary retention Outcome: Progressing   Problem: Pain Managment: Goal: General experience of comfort will improve Outcome: Progressing   Problem: Safety: Goal: Ability to remain free from injury will improve Outcome: Progressing   Problem: Skin Integrity: Goal: Risk for impaired skin integrity will decrease Outcome: Progressing   Problem: Education: Goal: Ability to describe self-care measures that may prevent or decrease complications (Diabetes Survival Skills Education) will improve Outcome: Progressing Goal: Individualized Educational Video(s) Outcome: Progressing   Problem: Coping: Goal: Ability to adjust to condition or change in health will improve Outcome: Progressing   Problem: Fluid Volume: Goal: Ability to maintain a balanced intake and output will improve Outcome: Progressing   Problem: Health Behavior/Discharge Planning: Goal: Ability to identify and utilize available resources and services will improve Outcome: Progressing Goal: Ability to manage health-related needs will improve Outcome: Progressing   Problem: Metabolic: Goal: Ability to maintain appropriate glucose levels will improve Outcome: Progressing   Problem: Nutritional: Goal: Maintenance of adequate nutrition will improve Outcome: Progressing Goal: Progress toward achieving an optimal weight will improve Outcome: Progressing   Problem: Skin Integrity: Goal: Risk for impaired skin integrity will decrease Outcome: Progressing   Problem: Tissue Perfusion: Goal: Adequacy of tissue perfusion will improve Outcome: Progressing   Problem: Safety: Goal: Non-violent Restraint(s) Outcome: Progressing

## 2022-09-10 ENCOUNTER — Other Ambulatory Visit: Payer: Self-pay

## 2022-09-10 ENCOUNTER — Emergency Department (HOSPITAL_COMMUNITY)
Admission: EM | Admit: 2022-09-10 | Discharge: 2022-09-10 | Discharge status: Against Medical Advice | Payer: 59 | Attending: Emergency Medicine | Admitting: Emergency Medicine

## 2022-09-10 DIAGNOSIS — S0240CA Maxillary fracture, right side, initial encounter for closed fracture: Secondary | ICD-10-CM | POA: Diagnosis not present

## 2022-09-10 DIAGNOSIS — S0281XA Fracture of other specified skull and facial bones, right side, initial encounter for closed fracture: Secondary | ICD-10-CM | POA: Diagnosis not present

## 2022-09-10 DIAGNOSIS — S42202A Unspecified fracture of upper end of left humerus, initial encounter for closed fracture: Secondary | ICD-10-CM | POA: Diagnosis not present

## 2022-09-10 DIAGNOSIS — M25561 Pain in right knee: Secondary | ICD-10-CM | POA: Insufficient documentation

## 2022-09-10 DIAGNOSIS — R569 Unspecified convulsions: Secondary | ICD-10-CM | POA: Diagnosis not present

## 2022-09-10 DIAGNOSIS — S0240EA Zygomatic fracture, right side, initial encounter for closed fracture: Secondary | ICD-10-CM | POA: Diagnosis not present

## 2022-09-10 DIAGNOSIS — Z5321 Procedure and treatment not carried out due to patient leaving prior to being seen by health care provider: Secondary | ICD-10-CM | POA: Diagnosis not present

## 2022-09-10 LAB — GLUCOSE, CAPILLARY
Glucose-Capillary: 148 mg/dL — ABNORMAL HIGH (ref 70–99)
Glucose-Capillary: 132 mg/dL — ABNORMAL HIGH (ref 70–99)

## 2022-09-10 MED ORDER — DOCUSATE SODIUM 100 MG PO CAPS: 100.0000 mg | ORAL_CAPSULE | Freq: Two times a day (BID) | ORAL | 0 refills | Status: DC | PRN

## 2022-09-10 MED ORDER — ACETAMINOPHEN 500 MG PO TABS: 1000.0000 mg | ORAL_TABLET | Freq: Three times a day (TID) | ORAL | 0 refills | Status: DC | PRN

## 2022-09-10 MED ORDER — ENOXAPARIN SODIUM 40 MG/0.4ML IJ SOSY: 40.0000 mg | PREFILLED_SYRINGE | INTRAMUSCULAR | 0 refills | Status: DC

## 2022-09-10 MED ORDER — OXYCODONE HCL ER 15 MG PO T12A: 15.0000 mg | EXTENDED_RELEASE_TABLET | Freq: Two times a day (BID) | ORAL | 0 refills | Status: DC | PRN

## 2022-09-10 MED ORDER — POLYETHYLENE GLYCOL 3350 17 G PO PACK: 17.0000 g | PACK | Freq: Every day | ORAL | 0 refills | Status: DC | PRN

## 2022-09-10 MED ORDER — OXYCODONE HCL 10 MG PO TABS: 5.0000 mg | ORAL_TABLET | Freq: Four times a day (QID) | ORAL | 0 refills | Status: DC | PRN

## 2022-09-10 MED ORDER — ZONISAMIDE 100 MG PO CAPS: 200.0000 mg | ORAL_CAPSULE | Freq: Every day | ORAL | 0 refills | Status: DC

## 2022-09-10 MED ORDER — METHOCARBAMOL 500 MG PO TABS: 1000.0000 mg | ORAL_TABLET | Freq: Three times a day (TID) | ORAL | 0 refills | Status: DC | PRN

## 2022-09-10 MED ORDER — QUETIAPINE FUMARATE ER 200 MG PO TB24: 200.0000 mg | ORAL_TABLET | Freq: Every day | ORAL | 0 refills | Status: DC

## 2022-09-10 NOTE — ED Triage Notes (Signed)
Patient arrived with EMS from home , reports right knee pain today . Denies recent fall or injury. CBG=206. Patient decided to leave at arrival .

## 2022-09-10 NOTE — TOC Transition Note (Signed)
Transition of Care [facility]) - CM/SW Discharge Note   Patient Details  Name: [name] MRN: [id number] Date of Birth: 1966-01-28  Transition of Care [facility]) CM/SW Contact:  Ella Bodo, RN Phone Number: 09/10/2022, 1:05pm  Clinical Narrative:    Patient medically stable for discharge home today with daughter.  PT to provide education on safe assistance techniques for home.  All recommended DME has been delivered to room. Plan follow up at [place] [facility] for continued therapies.    Final next level of care: OP Rehab Barriers to Discharge: Barriers Resolved                         Discharge Plan and Services Additional resources added to the After Visit Summary for     Discharge Planning Services: CM Consult            DME Arranged: Walker rolling, Bedside commode, Shower stool DME Agency: [employer] Date DME Agency Contacted: 09/09/22 Time DME Agency Contacted: [id number] Representative spoke with at DME Agency: [place] Determinants of Health ([place]) Interventions [place]: No Food Insecurity (05/06/2022)  Housing: Low Risk  (05/06/2022)  Transportation Needs: No Transportation Needs (05/06/2022)  Utilities: Not At Risk (05/06/2022)  Alcohol Screen: Medium Risk (08/04/2018)  Social Connections: Moderately Isolated (05/06/2022)  Tobacco Use: High Risk (08/28/2022)     Readmission Risk Interventions    04/28/2022    2:18 PM 04/07/2022   12:41 PM 07/27/2020   11:06 AM  Readmission Risk Prevention Plan  Transportation Screening Complete Complete Complete  PCP or Specialist Appt within 5-7 Days   Complete  Home Care Screening   Complete  HRI or Home Care Consult  Complete   Social Work Consult for [place] Perez Planning/Counseling  Complete   Palliative Care Screening  Not Applicable   Medication Review Press photographer) Complete Complete   PCP or Specialist appointment within 3-5 days of discharge  Complete    HRI or [place] Complete    SW Recovery Care/Counseling Consult Complete    Palliative Care Screening Not [place] Not Applicable      Reinaldo Raddle, RN, BSN  Trauma/Neuro ICU Case Manager [phone]
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
## 2022-09-10 NOTE — Progress Notes (Signed)
After several hours of yelling at staff about wanting to be discharged patient and family left the unit without discharge instructions.  Patient continuously verbally aggressive with staff throughout the morning in regards to leaving with her agression escalating when her family arrived.  Multiple attempts made by numerous staff members to explain the importance of waiting for discharge orders and instructions.  PA made aware that patient had left the building

## 2022-09-10 NOTE — ED Notes (Signed)
Assisted back to her wheelchair .
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
## 2022-09-11 ENCOUNTER — Other Ambulatory Visit: Payer: Self-pay

## 2022-09-11 ENCOUNTER — Emergency Department (HOSPITAL_COMMUNITY): Payer: 59

## 2022-09-11 ENCOUNTER — Emergency Department (HOSPITAL_COMMUNITY)
Admission: EM | Admit: 2022-09-11 | Discharge: 2022-09-11 | Disposition: A | Discharge status: Home / Self Care | Payer: 59 | Attending: Emergency Medicine | Admitting: Emergency Medicine

## 2022-09-11 DIAGNOSIS — E119 Type 2 diabetes mellitus without complications: Secondary | ICD-10-CM | POA: Diagnosis not present

## 2022-09-11 DIAGNOSIS — M25532 Pain in left wrist: Secondary | ICD-10-CM | POA: Diagnosis not present

## 2022-09-11 DIAGNOSIS — S82101A Unspecified fracture of upper end of right tibia, initial encounter for closed fracture: Secondary | ICD-10-CM | POA: Diagnosis not present

## 2022-09-11 DIAGNOSIS — J449 Chronic obstructive pulmonary disease, unspecified: Secondary | ICD-10-CM | POA: Insufficient documentation

## 2022-09-11 DIAGNOSIS — S42022A Displaced fracture of shaft of left clavicle, initial encounter for closed fracture: Secondary | ICD-10-CM | POA: Diagnosis not present

## 2022-09-11 DIAGNOSIS — M79604 Pain in right leg: Secondary | ICD-10-CM | POA: Diagnosis not present

## 2022-09-11 DIAGNOSIS — M25561 Pain in right knee: Secondary | ICD-10-CM | POA: Diagnosis not present

## 2022-09-11 DIAGNOSIS — M79602 Pain in left arm: Secondary | ICD-10-CM

## 2022-09-11 DIAGNOSIS — E039 Hypothyroidism, unspecified: Secondary | ICD-10-CM | POA: Diagnosis not present

## 2022-09-11 DIAGNOSIS — I10 Essential (primary) hypertension: Secondary | ICD-10-CM | POA: Diagnosis not present

## 2022-09-11 DIAGNOSIS — M25522 Pain in left elbow: Secondary | ICD-10-CM | POA: Diagnosis not present

## 2022-09-11 DIAGNOSIS — M79603 Pain in arm, unspecified: Secondary | ICD-10-CM | POA: Diagnosis not present

## 2022-09-11 MED ORDER — OXYCODONE HCL 5 MG PO TABS
10.0000 mg | ORAL_TABLET | Freq: Once | ORAL | Status: AC
  Administered 2022-09-11: 10 mg via ORAL
  Filled 2022-09-11: qty 2
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
## 2022-09-11 NOTE — ED Triage Notes (Signed)
Patient arrived with EMS from home reports right knee pain onset yesterday , no injury or fall .

## 2022-09-13 ENCOUNTER — Emergency Department (HOSPITAL_COMMUNITY): Payer: 59

## 2022-09-13 ENCOUNTER — Emergency Department (HOSPITAL_COMMUNITY)
Admission: EM | Admit: 2022-09-13 | Discharge: 2022-09-13 | Disposition: A | Discharge status: Home / Self Care | Payer: 59 | Attending: Emergency Medicine | Admitting: Emergency Medicine

## 2022-09-13 ENCOUNTER — Encounter (HOSPITAL_COMMUNITY): Payer: Self-pay

## 2022-09-13 ENCOUNTER — Other Ambulatory Visit: Payer: Self-pay

## 2022-09-13 DIAGNOSIS — W1811XA Fall from or off toilet without subsequent striking against object, initial encounter: Secondary | ICD-10-CM | POA: Diagnosis not present

## 2022-09-13 DIAGNOSIS — Z043 Encounter for examination and observation following other accident: Secondary | ICD-10-CM | POA: Diagnosis not present

## 2022-09-13 DIAGNOSIS — Y92009 Unspecified place in unspecified non-institutional (private) residence as the place of occurrence of the external cause: Secondary | ICD-10-CM | POA: Diagnosis not present

## 2022-09-13 DIAGNOSIS — M25551 Pain in right hip: Secondary | ICD-10-CM | POA: Diagnosis not present

## 2022-09-13 DIAGNOSIS — W19XXXA Unspecified fall, initial encounter: Secondary | ICD-10-CM

## 2022-09-13 DIAGNOSIS — S73014A Posterior dislocation of right hip, initial encounter: Secondary | ICD-10-CM | POA: Diagnosis not present

## 2022-09-13 DIAGNOSIS — E119 Type 2 diabetes mellitus without complications: Secondary | ICD-10-CM | POA: Insufficient documentation

## 2022-09-13 DIAGNOSIS — S199XXA Unspecified injury of neck, initial encounter: Secondary | ICD-10-CM | POA: Diagnosis not present

## 2022-09-13 DIAGNOSIS — T84020A Dislocation of internal right hip prosthesis, initial encounter: Secondary | ICD-10-CM | POA: Diagnosis not present

## 2022-09-13 DIAGNOSIS — R739 Hyperglycemia, unspecified: Secondary | ICD-10-CM | POA: Diagnosis not present

## 2022-09-13 DIAGNOSIS — S0990XA Unspecified injury of head, initial encounter: Secondary | ICD-10-CM | POA: Diagnosis not present

## 2022-09-13 DIAGNOSIS — J449 Chronic obstructive pulmonary disease, unspecified: Secondary | ICD-10-CM | POA: Diagnosis not present

## 2022-09-13 DIAGNOSIS — S73004A Unspecified dislocation of right hip, initial encounter: Secondary | ICD-10-CM

## 2022-09-13 DIAGNOSIS — S79911A Unspecified injury of right hip, initial encounter: Secondary | ICD-10-CM | POA: Diagnosis present

## 2022-09-13 LAB — COMPREHENSIVE METABOLIC PANEL
ALT: 16 U/L (ref 0–44)
AST: 24 U/L (ref 15–41)
Albumin: 3 g/dL — ABNORMAL LOW (ref 3.5–5.0)
Alkaline Phosphatase: 206 U/L — ABNORMAL HIGH (ref 38–126)
Anion gap: 13 (ref 5–15)
BUN: 6 mg/dL (ref 6–20)
CO2: 22 mmol/L (ref 22–32)
Calcium: 8.3 mg/dL — ABNORMAL LOW (ref 8.9–10.3)
Chloride: 98 mmol/L (ref 98–111)
Creatinine, Ser: 0.54 mg/dL (ref 0.44–1.00)
GFR, Estimated: >60 mL/min (ref >60)
Glucose, Bld: 194 mg/dL — ABNORMAL HIGH (ref 70–99)
Potassium: 4.1 mmol/L (ref 3.5–5.1)
Sodium: 133 mmol/L — ABNORMAL LOW (ref 135–145)
Total Bilirubin: 0.5 mg/dL (ref 0.3–1.2)
Total Protein: 6.8 g/dL (ref 6.5–8.1)

## 2022-09-13 LAB — CBC WITH DIFFERENTIAL/PLATELET
Abs Immature Granulocytes: 0.02 10*3/uL (ref 0.00–0.07)
Basophils Absolute: 0 10*3/uL (ref 0.0–0.1)
Basophils Relative: 0 %
Eosinophils Absolute: 0.2 10*3/uL (ref 0.0–0.5)
Eosinophils Relative: 2 %
HCT: 30.1 % — ABNORMAL LOW (ref 36.0–46.0)
Hemoglobin: 9.2 g/dL — ABNORMAL LOW (ref 12.0–15.0)
Immature Granulocytes: 0 %
Lymphocytes Relative: 11 %
Lymphs Abs: 1.1 10*3/uL (ref 0.7–4.0)
MCH: 26.3 pg (ref 26.0–34.0)
MCHC: 30.6 g/dL (ref 30.0–36.0)
MCV: 86 fL (ref 80.0–100.0)
Monocytes Absolute: 0.5 10*3/uL (ref 0.1–1.0)
Monocytes Relative: 5 %
Neutro Abs: 8.4 10*3/uL — ABNORMAL HIGH (ref 1.7–7.7)
Neutrophils Relative %: 82 %
Platelets: 470 10*3/uL — ABNORMAL HIGH (ref 150–400)
RBC: 3.5 MIL/uL — ABNORMAL LOW (ref 3.87–5.11)
RDW: 16.1 % — ABNORMAL HIGH (ref 11.5–15.5)
WBC: 10.2 10*3/uL (ref 4.0–10.5)
nRBC: 0 % (ref 0.0–0.2)

## 2022-09-13 MED ORDER — HYDROMORPHONE HCL 1 MG/ML IJ SOLN
1.0000 mg | Freq: Once | INTRAMUSCULAR | Status: AC
  Administered 2022-09-13: 1 mg via INTRAVENOUS
  Filled 2022-09-13: qty 1

## 2022-09-13 MED ORDER — PROPOFOL 10 MG/ML IV BOLUS
INTRAVENOUS | Status: AC | PRN
  Administered 2022-09-13 (×2): 50 mg via INTRAVENOUS

## 2022-09-13 MED ORDER — PROPOFOL 10 MG/ML IV BOLUS
200.0000 mg | Freq: Once | INTRAVENOUS | Status: DC
  Filled 2022-09-13: qty 20

## 2022-09-13 MED ORDER — OXYCODONE HCL 5 MG PO TABS
10.0000 mg | ORAL_TABLET | Freq: Once | ORAL | Status: AC
  Administered 2022-09-13: 10 mg via ORAL
  Filled 2022-09-13: qty 2

## 2022-09-13 MED ORDER — OXYCODONE HCL ER 15 MG PO T12A: 15.0000 mg | EXTENDED_RELEASE_TABLET | Freq: Once | ORAL | Status: DC

## 2022-09-13 MED ORDER — FENTANYL CITRATE PF 50 MCG/ML IJ SOSY
50.0000 ug | PREFILLED_SYRINGE | Freq: Once | INTRAMUSCULAR | Status: AC
  Administered 2022-09-13: 50 ug via INTRAVENOUS
  Filled 2022-09-13: qty 1

## 2022-09-13 MED ORDER — ZONISAMIDE 100 MG PO CAPS
200.0000 mg | ORAL_CAPSULE | Freq: Every day | ORAL | Status: DC
  Administered 2022-09-13: 200 mg via ORAL
  Filled 2022-09-13: qty 2
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
## 2022-09-13 NOTE — ED Notes (Signed)
Call and update family [phone]

## 2022-09-13 NOTE — Progress Notes (Signed)
Orthopedic Tech Progress Note Patient Details:  [name] [birth year] [id number]  Ortho Devices Type of Ortho Device: Knee Immobilizer Ortho Device/Splint Location: RLE Ortho Device/Splint Interventions: Application   Post Interventions Patient Tolerated: Well  [name] [name] 09/13/2022, 6:27 PM
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
## 2022-09-13 NOTE — ED Provider Notes (Signed)
[facility] Provider Note   CSN: [id number] Arrival date & time: 09/13/22  1607     History  Chief Complaint  Patient presents with   [name] is a 57 y.o. female PMH chronic pain, COPD, diabetes, polysubstance abuse presenting with EMS for fall from the toilet at home landing on her right hip requiring assistance from family to get up off the floor EMS report. Patient did have recent replacement of hip on 08/27/2022.  She is having severe right lower extremity pain with obvious deformity.     Patient is uncooperative and only intermittently answering questions.  She is in significant pain.  She is overall unreliable historian stating that she does not know if she fell off the toilet, she just "woke up with this pain."  Then also reporting that her brother may have said that she had a seizure.  She states she may have had 1 beer this morning, but otherwise denies other substance use.     Fall       Home Medications Prior to Admission medications   Medication Sig Start Date End Date Taking? Authorizing Provider  acetaminophen (TYLENOL) 500 MG tablet Take 2 tablets (1,000 mg total) by mouth every 8 (eight) hours as needed. 09/10/22   Maczis, Barth Kirks, PA-C  docusate sodium (COLACE) 100 MG capsule Take 1 capsule (100 mg total) by mouth 2 (two) times daily as needed for mild constipation. 09/10/22   Maczis, Barth Kirks, PA-C  enoxaparin (LOVENOX) 40 MG/0.4ML injection Inject 0.4 mLs (40 mg total) into the skin daily for 14 days. 09/11/22 09/25/22  Maczis, Barth Kirks, PA-C  folic acid (FOLVITE) 1 MG tablet Take 1 tablet (1 mg total) by mouth daily. 08/26/22   Samuella Cota, MD  methocarbamol (ROBAXIN) 500 MG tablet Take 2 tablets (1,000 mg total) by mouth every 8 (eight) hours as needed for muscle spasms. 09/10/22   Maczis, Barth Kirks, PA-C  Multiple Vitamin (MULTIVITAMIN WITH MINERALS) TABS tablet Take 1 tablet by mouth daily. Patient  not taking: Reported on 07/05/2022 06/21/22 07/21/22  Dana Allan I, MD  nicotine (NICODERM CQ - DOSED IN MG/24 HOURS) 21 mg/24hr patch Place 1 patch (21 mg total) onto the skin daily. Patient not taking: Reported on 08/25/2022 07/11/22   Dwyane Dee, MD  oxyCODONE (OXYCONTIN) 15 mg 12 hr tablet Take 1 tablet (15 mg total) by mouth every 12 (twelve) hours as needed. 09/10/22   Maczis, Barth Kirks, PA-C  oxyCODONE 10 MG TABS Take 0.5-1 tablets (5-10 mg total) by mouth every 6 (six) hours as needed for breakthrough pain. 09/10/22   Maczis, Barth Kirks, PA-C  polyethylene glycol (MIRALAX / GLYCOLAX) 17 g packet Take 17 g by mouth daily as needed. 09/10/22   Maczis, Barth Kirks, PA-C  QUEtiapine (SEROQUEL XR) 200 MG 24 hr tablet Take 1 tablet (200 mg total) by mouth at bedtime. 09/10/22   Maczis, Barth Kirks, PA-C  senna-docusate (SENOKOT-S) 8.6-50 MG tablet Take 1 tablet by mouth at bedtime as needed for mild constipation. Patient not taking: Reported on 08/25/2022 07/10/22   Dwyane Dee, MD  thiamine (VITAMIN B-1) 100 MG tablet Take 1 tablet (100 mg total) by mouth daily. 08/26/22   Samuella Cota, MD  zonisamide (ZONEGRAN) 100 MG capsule Take 2 capsules (200 mg total) by mouth at bedtime. 09/10/22   Maczis, Barth Kirks, PA-C      Allergies    Iron dextran, Nsaids,  and Aspirin    Review of Systems   Review of Systems See HPI  Physical Exam Updated Vital Signs BP (!) 146/85   Pulse (!) 115   Temp 98 F (36.7 C)   Resp 20   LMP 01/08/2011   SpO2 96%  Vitals and nursing note reviewed.  Constitutional:      General: She is in acute distress (Acutely uncomfortable pain.).     Appearance: She is well-developed.  HENT:     Head: Normocephalic and atraumatic.     Mouth/Throat:     Mouth: Mucous membranes are dry.  Eyes:     Conjunctiva/sclera: Conjunctivae normal.     Pupils: Pupils are equal, round, and reactive to light.  Cardiovascular:     Rate and Rhythm: Normal rate and regular rhythm.      Pulses: Normal pulses.     Heart sounds: Normal heart sounds. No murmur heard. Pulmonary:     Effort: Pulmonary effort is normal. No respiratory distress.     Breath sounds: Normal breath sounds.  Abdominal:     General: Abdomen is flat. There is no distension.     Palpations: Abdomen is soft.     Tenderness: There is no abdominal tenderness.  Musculoskeletal:        General: No swelling.     Cervical back: Neck supple.     Comments: No CTL spinal tenderness She has cast on right ankle, L knee brace over right knee with tenderness throughout RLE.  Pulses are palpable, but RLE is shortened and adducted.  Skin:    General: Skin is warm and dry.     Capillary Refill: Capillary refill takes less than 2 seconds.   ED Results / Procedures / Treatments   Labs (all labs ordered are listed, but only abnormal results are displayed) Labs Reviewed  CBC WITH DIFFERENTIAL/PLATELET - Abnormal; Notable for the following components:      Result Value   RBC 3.50 (*)    Hemoglobin 9.2 (*)    HCT 30.1 (*)    RDW 16.1 (*)    Platelets 470 (*)    Neutro Abs 8.4 (*)    All other components within normal limits  COMPREHENSIVE METABOLIC PANEL - Abnormal; Notable for the following components:   Sodium 133 (*)    Glucose, Bld 194 (*)    Calcium 8.3 (*)    Albumin 3.0 (*)    Alkaline Phosphatase 206 (*)    All other components within normal limits    EKG None  Radiology DG Foot 2 Views Right  Result Date: 09/13/2022 CLINICAL DATA:  Fall. EXAM: RIGHT FOOT - 2 VIEW COMPARISON:  Right foot 08/26/2022 FINDINGS: A single AP view of the right foot is obtained representing limited evaluation. Degenerative changes are present in the interphalangeal, first metatarsal-phalangeal, and visualized intertarsal joints. No new acute displaced fractures identified on limited single view. IMPRESSION: Limited single view of the right foot demonstrates degenerative changes. No acute abnormalities demonstrated on  this limited image. Electronically Signed   By: Lucienne Capers M.D.   On: 09/13/2022 19:14   DG Tibia/Fibula Right  Result Date: 09/13/2022 CLINICAL DATA:  Fall. EXAM: RIGHT TIBIA AND FIBULA - 2 VIEW COMPARISON:  Right knee 09/11/2022 FINDINGS: Limited single view examination. Linear sclerosis and cortical irregularity in the right tibial metaphysis corresponding to known impacted fracture as seen on previous study from 09/11/2022. Alignment appears unchanged on this single view. Suggestion of cortical irregularity in the distal fibula which  may indicate a nondisplaced fracture. Consider right ankle views for better evaluation. Soft tissues are unremarkable. IMPRESSION: 1. Impacted fractures of the proximal tibial metaphysis, better visualized on right knee series of 09/11/2022. 2. Suggestion of cortical irregularity in the distal fibula possibly representing a nondisplaced fracture. Consider right ankle views for better evaluation. Electronically Signed   By: Lucienne Capers M.D.   On: 09/13/2022 19:12   DG FEMUR 1V RIGHT  Result Date: 09/13/2022 CLINICAL DATA:  Postreduction of right hip after a fall. EXAM: RIGHT FEMUR 1 VIEW COMPARISON:  09/09/2022 FINDINGS: Right hip arthroplasty using non cemented femoral component and screw fixation of the acetabular component. Components appear well seated. No evidence of hip dislocation on single view. Degenerative changes in the right knee with medial and lateral compartment narrowing and osteophyte formation. Cortical irregularity and linear sclerosis demonstrated in the proximal tibia suggesting likely medial and lateral tibial plateau fractures, incompletely visualized on single view. No radiopaque soft tissue foreign bodies. IMPRESSION: 1. Right hip arthroplasty appears intact. 2. Probable impacted fractures of the medial and lateral tibial plateau. Electronically Signed   By: Lucienne Capers M.D.   On: 09/13/2022 19:05   CT HEAD WO CONTRAST  (5MM)  Result Date: 09/13/2022 CLINICAL DATA:  Head trauma, abnormal mental status (Age 24-64y); Neck trauma, dangerous injury mechanism (Age 8-64y) EXAM: CT HEAD WITHOUT CONTRAST CT CERVICAL SPINE WITHOUT CONTRAST TECHNIQUE: Multidetector CT imaging of the head and cervical spine was performed following the standard protocol without intravenous contrast. Multiplanar CT image reconstructions of the cervical spine were also generated. RADIATION DOSE REDUCTION: This exam was performed according to the departmental dose-optimization program which includes automated exposure control, adjustment of the mA and/or kV according to patient size and/or use of iterative reconstruction technique. COMPARISON:  None Available. FINDINGS: CT HEAD FINDINGS Brain: No evidence of large-territorial acute infarction. No parenchymal hemorrhage. No mass lesion. No extra-axial collection. No mass effect or midline shift. No hydrocephalus. Basilar cisterns are patent. Vascular: No hyperdense vessel. Skull: No acute fracture or focal lesion. Old healed right zygomatic arch fracture. Sinuses/Orbits: Paranasal sinuses and mastoid air cells are clear. The orbits are unremarkable. Other: None. CT CERVICAL SPINE FINDINGS Alignment: Normal. Skull base and vertebrae: Multilevel mild degenerative changes of the spine. No associated severe osseous neural foraminal or central canal stenosis. No acute fracture of the cervical spine. Acute T1 compression fracture versus acute incomplete burst fracture with mild superior endplate vertebral body height loss and no definite retropulsion into the central canal. No aggressive appearing focal osseous lesion or focal pathologic process. Soft tissues and spinal canal: No prevertebral fluid or swelling. No visible canal hematoma. Upper chest: Emphysematous changes. Other: Likely partially visualized acute left anterior costochondral junction first rib fracture (8:81, 9:59). Question nondisplaced posterior  left third rib fracture (9:106). IMPRESSION: 1. No acute intracranial abnormality. 2. Acute T1 compression fracture versus acute incomplete burst fracture with mild superior endplate vertebral body height loss and no definite retropulsion into the central canal. 3. No acute displaced fracture or traumatic listhesis of the cervical spine. 4.  Emphysema (ICD10-J43.9). 5. Likely partially visualized acute left anterior costochondral junction first rib fracture. Question nondisplaced posterior left third rib fracture. Electronically Signed   By: Iven Finn M.D.   On: 09/13/2022 17:30   CT Cervical Spine Wo Contrast  Result Date: 09/13/2022 CLINICAL DATA:  Head trauma, abnormal mental status (Age 24-64y); Neck trauma, dangerous injury mechanism (Age 74-64y) EXAM: CT HEAD WITHOUT CONTRAST CT CERVICAL SPINE WITHOUT CONTRAST  TECHNIQUE: Multidetector CT imaging of the head and cervical spine was performed following the standard protocol without intravenous contrast. Multiplanar CT image reconstructions of the cervical spine were also generated. RADIATION DOSE REDUCTION: This exam was performed according to the departmental dose-optimization program which includes automated exposure control, adjustment of the mA and/or kV according to patient size and/or use of iterative reconstruction technique. COMPARISON:  None Available. FINDINGS: CT HEAD FINDINGS Brain: No evidence of large-territorial acute infarction. No parenchymal hemorrhage. No mass lesion. No extra-axial collection. No mass effect or midline shift. No hydrocephalus. Basilar cisterns are patent. Vascular: No hyperdense vessel. Skull: No acute fracture or focal lesion. Old healed right zygomatic arch fracture. Sinuses/Orbits: Paranasal sinuses and mastoid air cells are clear. The orbits are unremarkable. Other: None. CT CERVICAL SPINE FINDINGS Alignment: Normal. Skull base and vertebrae: Multilevel mild degenerative changes of the spine. No associated severe  osseous neural foraminal or central canal stenosis. No acute fracture of the cervical spine. Acute T1 compression fracture versus acute incomplete burst fracture with mild superior endplate vertebral body height loss and no definite retropulsion into the central canal. No aggressive appearing focal osseous lesion or focal pathologic process. Soft tissues and spinal canal: No prevertebral fluid or swelling. No visible canal hematoma. Upper chest: Emphysematous changes. Other: Likely partially visualized acute left anterior costochondral junction first rib fracture (8:81, 9:59). Question nondisplaced posterior left third rib fracture (9:106). IMPRESSION: 1. No acute intracranial abnormality. 2. Acute T1 compression fracture versus acute incomplete burst fracture with mild superior endplate vertebral body height loss and no definite retropulsion into the central canal. 3. No acute displaced fracture or traumatic listhesis of the cervical spine. 4.  Emphysema (ICD10-J43.9). 5. Likely partially visualized acute left anterior costochondral junction first rib fracture. Question nondisplaced posterior left third rib fracture. Electronically Signed   By: Iven Finn M.D.   On: 09/13/2022 17:30   DG Pelvis 1-2 Views  Result Date: 09/13/2022 CLINICAL DATA:  Fall off toilet and now has right hip pain EXAM: PELVIS - 1-2 VIEW COMPARISON:  X-ray right hip 09/09/2022. FINDINGS: Total right hip arthroplasty with interval dislocation of the femoral head component superoposteriorly in relation to the acetabular component. No acute fracture identified. No periprosthetic lucency identified. No acute displaced fracture or dislocation of the left hip. No acute displaced fracture or diastasis bones of the pelvis. Sacrum grossly unremarkable- Limited evaluation due to overlapping osseous structures and overlying soft tissues. no pelvic bone lesions are seen. Right hip subcutaneus soft tissue edema. IMPRESSION: Total right hip  arthroplasty with dislocation of the femoral head component superoposteriorly in relation to the acetabular component. Electronically Signed   By: Iven Finn M.D.   On: 09/13/2022 17:19    Procedures .Ortho Injury Treatment  Date/Time: 09/13/2022 7:09 PM  Performed by: Bradd Canary, MD Authorized by: Drenda Freeze, MD   Consent:    Consent obtained:  Written   Consent given by:  Patient   Procedural risks discussed: arthroplasty hip reduction.Injury location: hip Location details: right hip Injury type: dislocation Dislocation type: posterior Spontaneous dislocation: no Prosthesis: yes Pre-procedure neurovascular assessment: neurovascularly intact Pre-procedure distal perfusion: normal Pre-procedure neurological function: normal Pre-procedure range of motion: reduced  Patient sedated: Yes. Refer to sedation procedure documentation for details of sedation. Manipulation performed: yes Reduction method: Allis maneuver Reduction successful: yes X-ray confirmed reduction: yes Immobilization: splint (knee immobilizer) Splint Applied by: Sheliah Hatch Post-procedure neurovascular assessment: post-procedure neurovascularly intact Post-procedure distal perfusion: normal Post-procedure neurological function: normal Post-procedure range  of motion: normal       Medications Ordered in ED Medications  zonisamide (ZONEGRAN) capsule 200 mg (200 mg Oral Given 09/13/22 1707)  propofol (DIPRIVAN) 10 mg/mL bolus/IV push 200 mg (has no administration in time range)  fentaNYL (SUBLIMAZE) injection 50 mcg (50 mcg Intravenous Given 09/13/22 1637)  HYDROmorphone (DILAUDID) injection 1 mg (1 mg Intravenous Given 09/13/22 1706)  propofol (DIPRIVAN) 10 mg/mL bolus/IV push (50 mg Intravenous Given 09/13/22 1757)  HYDROmorphone (DILAUDID) injection 1 mg (1 mg Intravenous Given 09/13/22 1823)  oxyCODONE (Oxy IR/ROXICODONE) immediate release tablet 10 mg (10 mg Oral Given 09/13/22 2026)    ED  Course/ Medical Decision Making/ A&P Clinical Course as of 09/13/22 2356  Sat Sep 13, 2022  1718 1 week f/u in ortho [CO]  1851 Check at [facility] to send home [CO]    Clinical Course User Index [CO] Bradd Canary, MD                             Medical Decision Making Amount and/or Complexity of Data Reviewed Labs: ordered. Radiology: ordered.  Risk Prescription drug management.   Patient presents hemodynamically stable, but slightly tachycardic.  Patient appears acutely uncomfortable and in severe pain on arrival.  She was given fentanyl and Dilaudid in the emergency department to alleviate her pain.   X-ray of the pelvis showed superior and posterior dislocation of right hip.  Discussed with orthopedic surgery who recommended sedation and reduction in the emergency department, knee immobilizer and follow-up in their clinic in a week.  Patient was sedated using propofol, for details of this procedure see note above.  Successful reduction was confirmed on x-ray.  Patient's other x-rays of the RLE showed no other acute fractures.  CT head and cervical spine showed concern for T1 acute compression fracture.  However, on exam patient had no point tenderness in this area and is having no concerning spinal cord injury signs including numbness or tingling in extremities, urinary or bowel incontinence.  Patient's pain was able to be managed with Dilaudid and then transition to oral medication using her home dose of oxycodone.  Patient felt safe going home with pain medication she has was cleared for discharge with a 1 week follow-up at orthopedic clinic.        Final Clinical Impression(s) / ED Diagnoses Final diagnoses:  Fall, initial encounter  Hip dislocation, right, initial encounter [facility])    Rx / [place] Orders ED Discharge Orders     None         Bradd Canary, MD 09/13/22 2357    Drenda Freeze, MD 09/14/22 1859

## 2022-09-13 NOTE — Sedation Documentation (Signed)
Respirations provided via ambu bag for 1 minute by RT

## 2022-09-13 NOTE — ED Notes (Signed)
This RN updated pts daughter [name].

## 2022-09-13 NOTE — ED Notes (Signed)
Pt yelling out.  RN went and spoke to pt who stated she was in pain and wanted to leave right now.  RN reviewed chart and educated pt that the PO medication would take a little longer to work.  Pt upset b/c she "has been discharged" but she was still here.  RN explained that we were waiting for the discharge paperwork however the provider was w/ a very critical pt.    Pt indicated that she understood requested something to eat which was given.

## 2022-09-13 NOTE — ED Notes (Signed)
Pt's family member came to the desk and RN apologized and explained the situation.  Family said they understood.  Pt is being prepped to leave as soon as the

## 2022-09-13 NOTE — Discharge Instructions (Addendum)
Wear the knee immobilizer and follow up with Dr. Zachery Dakins   Continue taking your pain meds as prescribed   Return to ER if you have uncontrolled pain, another fall

## 2022-09-13 NOTE — Sedation Documentation (Signed)
Medication dose calculated and verified for: Propofol 

## 2022-09-14 NOTE — ED Provider Notes (Signed)
  Physical Exam  BP (!) 146/85   Pulse (!) 115   Temp 98 F (36.7 C)   Resp 20   LMP 01/08/2011   SpO2 96%   Physical Exam  Procedures  .Sedation  Date/Time: 09/14/2022 6:56 PM  Performed by: Drenda Freeze, MD Authorized by: Drenda Freeze, MD   Consent:    Consent obtained:  Verbal and written   Consent given by:  Patient   Alternatives discussed:  Analgesia without sedation Universal protocol:    Immediately prior to procedure, a time out was called: yes   Pre-sedation assessment:    Time since last food or drink:  6 hours   ASA classification: class 1 - normal, healthy patient     Mallampati score:  I - soft palate, uvula, fauces, pillars visible   Pre-sedation assessments completed and reviewed: airway patency   Immediate pre-procedure details:    Reviewed: vital signs     Verified: bag valve mask available   Procedure details (see MAR for exact dosages):    Preoxygenation:  Room air   Sedation:  Propofol   Intended level of sedation: deep   Intra-procedure events: respiratory depression     Intra-procedure management:  Airway repositioning and BVM ventilation   Total Provider sedation time (minutes):  40   ED Course / MDM   Clinical Course as of 09/14/22 1855  Sat Sep 13, 2022  1718 1 week f/u in ortho [CO]  1851 Check at [facility] to send home [CO]    Clinical Course User Index [CO] Bradd Canary, MD   Medical Decision Making I saw and evaluated the patient, reviewed the resident's note and I agree with the findings and plan.    [name] is a 57 y.o. female with recent right hip replacement here presenting with right hip pain and fall.  Patient was apparently on the commode and slid off and may have dislocated her hip.  Patient had recent hip replacement also has proximal tibial fracture.  X-ray showed superior hip dislocation.  Consulted Dr. Percell Miller, who is covering Dr. Clayborne Dana.  He wants [place] to perform conscious sedation and reduced  the hip.  I performed the conscious sedation and supervised the hip reduction with the resident.  We were able to confirm the reduction with x-ray.  Patient's lower extremity x-ray also showed known tibial fracture.  Patient's pain was under control.  Patient left before she received her paperwork.  Told her to follow-up with orthopedic doctor    Amount and/or Complexity of Data Reviewed Labs: ordered. Decision-making details documented in ED Course. Radiology: ordered and independent interpretation performed. Decision-making details documented in ED Course.  Risk Prescription drug management.          Drenda Freeze, MD 09/14/22 [phone]

## 2022-09-16 ENCOUNTER — Other Ambulatory Visit: Payer: Self-pay | Admitting: Internal Medicine

## 2022-09-16 DIAGNOSIS — N390 Urinary tract infection, site not specified: Secondary | ICD-10-CM | POA: Diagnosis not present

## 2022-09-16 DIAGNOSIS — S72001D Fracture of unspecified part of neck of right femur, subsequent encounter for closed fracture with routine healing: Secondary | ICD-10-CM | POA: Diagnosis not present

## 2022-09-16 DIAGNOSIS — I10 Essential (primary) hypertension: Secondary | ICD-10-CM | POA: Diagnosis not present

## 2022-09-16 DIAGNOSIS — E1140 Type 2 diabetes mellitus with diabetic neuropathy, unspecified: Secondary | ICD-10-CM | POA: Diagnosis not present

## 2022-09-16 DIAGNOSIS — S82101D Unspecified fracture of upper end of right tibia, subsequent encounter for closed fracture with routine healing: Secondary | ICD-10-CM | POA: Diagnosis not present

## 2022-09-17 LAB — URINE CULTURE
MICRO NUMBER:: 14743117
SPECIMEN QUALITY:: ADEQUATE

## 2022-09-19 ENCOUNTER — Other Ambulatory Visit: Payer: Self-pay

## 2022-09-19 ENCOUNTER — Emergency Department (HOSPITAL_COMMUNITY)
Admission: EM | Admit: 2022-09-19 | Discharge: 2022-09-23 | Disposition: A | Discharge status: Home / Self Care | Payer: 59 | Attending: Emergency Medicine | Admitting: Emergency Medicine

## 2022-09-19 ENCOUNTER — Emergency Department (HOSPITAL_COMMUNITY): Payer: 59

## 2022-09-19 DIAGNOSIS — F17210 Nicotine dependence, cigarettes, uncomplicated: Secondary | ICD-10-CM | POA: Insufficient documentation

## 2022-09-19 DIAGNOSIS — I10 Essential (primary) hypertension: Secondary | ICD-10-CM | POA: Insufficient documentation

## 2022-09-19 DIAGNOSIS — S82101A Unspecified fracture of upper end of right tibia, initial encounter for closed fracture: Secondary | ICD-10-CM | POA: Diagnosis not present

## 2022-09-19 DIAGNOSIS — E119 Type 2 diabetes mellitus without complications: Secondary | ICD-10-CM | POA: Insufficient documentation

## 2022-09-19 DIAGNOSIS — F250 Schizoaffective disorder, bipolar type: Secondary | ICD-10-CM | POA: Insufficient documentation

## 2022-09-19 DIAGNOSIS — X58XXXS Exposure to other specified factors, sequela: Secondary | ICD-10-CM | POA: Diagnosis not present

## 2022-09-19 DIAGNOSIS — T1491XA Suicide attempt, initial encounter: Secondary | ICD-10-CM | POA: Diagnosis present

## 2022-09-19 DIAGNOSIS — F1994 Other psychoactive substance use, unspecified with psychoactive substance-induced mood disorder: Secondary | ICD-10-CM | POA: Diagnosis present

## 2022-09-19 DIAGNOSIS — E039 Hypothyroidism, unspecified: Secondary | ICD-10-CM | POA: Diagnosis not present

## 2022-09-19 DIAGNOSIS — S82191S Other fracture of upper end of right tibia, sequela: Secondary | ICD-10-CM | POA: Diagnosis not present

## 2022-09-19 DIAGNOSIS — F4325 Adjustment disorder with mixed disturbance of emotions and conduct: Secondary | ICD-10-CM | POA: Diagnosis present

## 2022-09-19 DIAGNOSIS — R000 Tachycardia, unspecified: Secondary | ICD-10-CM | POA: Diagnosis not present

## 2022-09-19 DIAGNOSIS — F29 Unspecified psychosis not due to a substance or known physiological condition: Secondary | ICD-10-CM | POA: Diagnosis not present

## 2022-09-19 DIAGNOSIS — F1914 Other psychoactive substance abuse with psychoactive substance-induced mood disorder: Secondary | ICD-10-CM | POA: Insufficient documentation

## 2022-09-19 DIAGNOSIS — J449 Chronic obstructive pulmonary disease, unspecified: Secondary | ICD-10-CM | POA: Diagnosis not present

## 2022-09-19 DIAGNOSIS — F259 Schizoaffective disorder, unspecified: Secondary | ICD-10-CM | POA: Diagnosis present

## 2022-09-19 DIAGNOSIS — Z1152 Encounter for screening for COVID-19: Secondary | ICD-10-CM | POA: Diagnosis not present

## 2022-09-19 DIAGNOSIS — T50912A Poisoning by multiple unspecified drugs, medicaments and biological substances, intentional self-harm, initial encounter: Secondary | ICD-10-CM | POA: Diagnosis present

## 2022-09-19 DIAGNOSIS — S82831A Other fracture of upper and lower end of right fibula, initial encounter for closed fracture: Secondary | ICD-10-CM | POA: Diagnosis not present

## 2022-09-19 DIAGNOSIS — S82101S Unspecified fracture of upper end of right tibia, sequela: Secondary | ICD-10-CM | POA: Diagnosis not present

## 2022-09-19 DIAGNOSIS — F1410 Cocaine abuse, uncomplicated: Secondary | ICD-10-CM | POA: Diagnosis not present

## 2022-09-19 LAB — CBC
HCT: 32.7 % — ABNORMAL LOW (ref 36.0–46.0)
Hemoglobin: 10.1 g/dL — ABNORMAL LOW (ref 12.0–15.0)
MCH: 25.8 pg — ABNORMAL LOW (ref 26.0–34.0)
MCHC: 30.9 g/dL (ref 30.0–36.0)
MCV: 83.4 fL (ref 80.0–100.0)
Platelets: 335 K/uL (ref 150–400)
RBC: 3.92 MIL/uL (ref 3.87–5.11)
RDW: 16.8 % — ABNORMAL HIGH (ref 11.5–15.5)
WBC: 6.1 K/uL (ref 4.0–10.5)
nRBC: 0 % (ref 0.0–0.2)

## 2022-09-19 LAB — COMPREHENSIVE METABOLIC PANEL WITH GFR
ALT: 13 U/L (ref 0–44)
AST: 14 U/L — ABNORMAL LOW (ref 15–41)
Albumin: 3.6 g/dL (ref 3.5–5.0)
Alkaline Phosphatase: 216 U/L — ABNORMAL HIGH (ref 38–126)
Anion gap: 10 (ref 5–15)
BUN: 6 mg/dL (ref 6–20)
CO2: 20 mmol/L — ABNORMAL LOW (ref 22–32)
Calcium: 8.4 mg/dL — ABNORMAL LOW (ref 8.9–10.3)
Chloride: 101 mmol/L (ref 98–111)
Creatinine, Ser: 0.33 mg/dL — ABNORMAL LOW (ref 0.44–1.00)
GFR, Estimated: >60 mL/min
Glucose, Bld: 150 mg/dL — ABNORMAL HIGH (ref 70–99)
Potassium: 3.7 mmol/L (ref 3.5–5.1)
Sodium: 131 mmol/L — ABNORMAL LOW (ref 135–145)
Total Bilirubin: 0.7 mg/dL (ref 0.3–1.2)
Total Protein: 7.5 g/dL (ref 6.5–8.1)

## 2022-09-19 LAB — RAPID URINE DRUG SCREEN, HOSP PERFORMED
Amphetamines: NOT DETECTED
Barbiturates: NOT DETECTED
Benzodiazepines: NOT DETECTED
Cocaine: POSITIVE — AB
Opiates: NOT DETECTED
Tetrahydrocannabinol: NOT DETECTED

## 2022-09-19 LAB — CBG MONITORING, ED: Glucose-Capillary: 155 mg/dL — ABNORMAL HIGH (ref 70–99)

## 2022-09-19 LAB — ETHANOL: Alcohol, Ethyl (B): 96 mg/dL — ABNORMAL HIGH (ref <10)

## 2022-09-19 MED ORDER — ZIPRASIDONE MESYLATE 20 MG IM SOLR
20.0000 mg | Freq: Once | INTRAMUSCULAR | Status: AC
  Administered 2022-09-19: 20 mg via INTRAMUSCULAR
  Filled 2022-09-19: qty 20

## 2022-09-19 MED ORDER — THIAMINE MONONITRATE 100 MG PO TABS
100.0000 mg | ORAL_TABLET | Freq: Every day | ORAL | Status: DC
  Administered 2022-09-19 – 2022-09-23 (×5): 100 mg via ORAL
  Filled 2022-09-19 (×5): qty 1

## 2022-09-19 MED ORDER — THIAMINE HCL 100 MG/ML IJ SOLN: 100.0000 mg | Freq: Every day | INTRAMUSCULAR | Status: DC

## 2022-09-19 MED ORDER — POLYETHYLENE GLYCOL 3350 17 G PO PACK: 17.0000 g | PACK | Freq: Every day | ORAL | Status: DC | PRN

## 2022-09-19 MED ORDER — ACETAMINOPHEN 500 MG PO TABS
1000.0000 mg | ORAL_TABLET | Freq: Three times a day (TID) | ORAL | Status: DC | PRN
  Administered 2022-09-21 – 2022-09-22 (×4): 1000 mg via ORAL
  Filled 2022-09-19 (×4): qty 2

## 2022-09-19 MED ORDER — METHOCARBAMOL 500 MG PO TABS
1000.0000 mg | ORAL_TABLET | Freq: Three times a day (TID) | ORAL | Status: DC | PRN
  Administered 2022-09-19 – 2022-09-23 (×5): 1000 mg via ORAL
  Filled 2022-09-19 (×5): qty 2

## 2022-09-19 MED ORDER — LORAZEPAM 1 MG PO TABS: 0.0000 mg | ORAL_TABLET | Freq: Two times a day (BID) | ORAL | Status: AC

## 2022-09-19 MED ORDER — DOCUSATE SODIUM 100 MG PO CAPS: 100.0000 mg | ORAL_CAPSULE | Freq: Two times a day (BID) | ORAL | Status: DC | PRN

## 2022-09-19 MED ORDER — LORAZEPAM 2 MG/ML IJ SOLN: 0.0000 mg | Freq: Two times a day (BID) | INTRAMUSCULAR | Status: AC

## 2022-09-19 MED ORDER — OXYCODONE HCL ER 15 MG PO T12A: 15.0000 mg | EXTENDED_RELEASE_TABLET | Freq: Two times a day (BID) | ORAL | Status: DC | PRN

## 2022-09-19 MED ORDER — ENOXAPARIN SODIUM 40 MG/0.4ML IJ SOSY
40.0000 mg | PREFILLED_SYRINGE | INTRAMUSCULAR | Status: DC
  Administered 2022-09-19 – 2022-09-22 (×4): 40 mg via SUBCUTANEOUS
  Filled 2022-09-19 (×5): qty 0.4

## 2022-09-19 MED ORDER — ZONISAMIDE 100 MG PO CAPS
200.0000 mg | ORAL_CAPSULE | Freq: Every day | ORAL | Status: DC
  Administered 2022-09-19 – 2022-09-22 (×4): 200 mg via ORAL
  Filled 2022-09-19 (×4): qty 2

## 2022-09-19 MED ORDER — STERILE WATER FOR INJECTION IJ SOLN
INTRAMUSCULAR | Status: AC
  Administered 2022-09-19: 1 mL
  Filled 2022-09-19: qty 10

## 2022-09-19 MED ORDER — LORAZEPAM 1 MG PO TABS
0.0000 mg | ORAL_TABLET | Freq: Four times a day (QID) | ORAL | Status: AC
  Administered 2022-09-19 (×2): 1 mg via ORAL
  Administered 2022-09-20 (×2): 2 mg via ORAL
  Administered 2022-09-21: 1 mg via ORAL
  Filled 2022-09-19 (×2): qty 1
  Filled 2022-09-19 (×2): qty 2
  Filled 2022-09-19: qty 1

## 2022-09-19 MED ORDER — QUETIAPINE FUMARATE ER 200 MG PO TB24
200.0000 mg | ORAL_TABLET | Freq: Every day | ORAL | Status: DC
  Administered 2022-09-19 – 2022-09-22 (×4): 200 mg via ORAL
  Filled 2022-09-19 (×5): qty 1

## 2022-09-19 MED ORDER — OXYCODONE HCL 5 MG PO TABS
5.0000 mg | ORAL_TABLET | Freq: Four times a day (QID) | ORAL | Status: DC | PRN
  Administered 2022-09-19 – 2022-09-20 (×3): 10 mg via ORAL
  Administered 2022-09-20 – 2022-09-22 (×4): 5 mg via ORAL
  Administered 2022-09-22 – 2022-09-23 (×3): 10 mg via ORAL
  Filled 2022-09-19 (×3): qty 2
  Filled 2022-09-19 (×2): qty 1
  Filled 2022-09-19 (×2): qty 2
  Filled 2022-09-19: qty 1
  Filled 2022-09-19: qty 2
  Filled 2022-09-19: qty 1

## 2022-09-19 MED ORDER — FOLIC ACID 1 MG PO TABS
1.0000 mg | ORAL_TABLET | Freq: Every day | ORAL | Status: DC
  Administered 2022-09-19 – 2022-09-23 (×5): 1 mg via ORAL
  Filled 2022-09-19 (×4): qty 1

## 2022-09-19 MED ORDER — OLANZAPINE 10 MG PO TBDP
10.0000 mg | ORAL_TABLET | Freq: Three times a day (TID) | ORAL | Status: DC | PRN
  Administered 2022-09-19 – 2022-09-22 (×3): 10 mg via ORAL
  Filled 2022-09-19 (×4): qty 1

## 2022-09-19 MED ORDER — LORAZEPAM 2 MG/ML IJ SOLN: 0.0000 mg | Freq: Four times a day (QID) | INTRAMUSCULAR | Status: AC

## 2022-09-19 MED ORDER — NICOTINE 21 MG/24HR TD PT24
21.0000 mg | MEDICATED_PATCH | Freq: Every day | TRANSDERMAL | Status: DC
  Administered 2022-09-19 – 2022-09-23 (×5): 21 mg via TRANSDERMAL
  Filled 2022-09-19 (×5): qty 1

## 2022-09-19 NOTE — ED Notes (Signed)
Pt resting after  eating snack no distress or tremors noted CIWA = 4
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
## 2022-09-19 NOTE — ED Notes (Signed)
Report given to Wille Glaser, RN in [place].

## 2022-09-20 DIAGNOSIS — S82101S Unspecified fracture of upper end of right tibia, sequela: Secondary | ICD-10-CM | POA: Diagnosis not present

## 2022-09-20 DIAGNOSIS — S82101A Unspecified fracture of upper end of right tibia, initial encounter for closed fracture: Secondary | ICD-10-CM | POA: Diagnosis not present

## 2022-09-20 DIAGNOSIS — F250 Schizoaffective disorder, bipolar type: Secondary | ICD-10-CM | POA: Diagnosis not present

## 2022-09-20 DIAGNOSIS — R451 Restlessness and agitation: Secondary | ICD-10-CM | POA: Diagnosis not present

## 2022-09-20 DIAGNOSIS — R000 Tachycardia, unspecified: Secondary | ICD-10-CM | POA: Diagnosis not present

## 2022-09-20 LAB — RESP PANEL BY RT-PCR (RSV, FLU A&B, COVID)  RVPGX2
Influenza A by PCR: NEGATIVE
Influenza B by PCR: NEGATIVE
Resp Syncytial Virus by PCR: NEGATIVE
SARS Coronavirus 2 by RT PCR: NEGATIVE

## 2022-09-20 MED ORDER — ZIPRASIDONE MESYLATE 20 MG IM SOLR: 20.0000 mg | Freq: Once | INTRAMUSCULAR | Status: DC

## 2022-09-20 NOTE — ED Notes (Signed)
Pt woke up asking for another sandwich and stated that she didn't get a snack. Pt did receive a snack around 2100 consisting of a ham sandwich, several packs of graham crackers and a gingerale. Pt then stated that she was wet. I asked what did she mean wet and she replied wet from sweat but she said she was cold. I checked her temperature and pt is afebrile.

## 2022-09-20 NOTE — ED Provider Notes (Addendum)
Emergency Medicine Observation Re-evaluation Note  [name] is a 57 y.o. female, seen on rounds today.  Pt initially presented to the ED for complaints of Psychiatric Evaluation Currently, the patient is resting.  Physical Exam  BP 114/71   Pulse (!) 116   Temp 98.5 F (36.9 C) (Oral)   Resp 16   Ht 5\' 2"  (1.575 m)   Wt 53.5 kg   LMP 01/08/2011   SpO2 91%   BMI 21.58 kg/m  Physical Exam General: NAD Cardiac: well perfused Lungs: even and unlabored Psych: no agitation  ED Course / MDM  EKG:EKG Interpretation  Date/Time:  Friday September 19 2022 01:52:28 EDT Ventricular Rate:  108 PR Interval:  141 QRS Duration: 77 QT Interval:  327 QTC Calculation: 439 R Axis:   48 Text Interpretation: Sinus tachycardia Probable left atrial enlargement Confirmed by [place]us, John [phone]) on 09/20/2022 4:27:48 AM  I have reviewed the labs performed to date as well as medications administered while in observation.  Recent changes in the last 24 hours include Pt accepted to [facility] today 3/30.  Plan  Current plan is for inpatient psychiatric care, [facility] accepting. Dr. Leta Jungling accepting physician. EMTALA completed.   0856 Informed that [facility] no longer accepting the patient due to concern for knee fracture. This fracture appears to be chronic.  That extremity was operated on by orthopedics previously and her tibial plateau fracture was notably nonoperative per orthopedics note 08/27/2022: "WB: Toe-touch weightbearing right lower extremity given nonoperative tibial plateau fracture, No formal hip precautions ."  Update: Not accepted to [facility] as patients need to be ambulatory for admission. PT consult and TOC consult placed given the patients physical limitations, social concerns in addition to her acute psychiatric presentation.    Regan Lemming, MD 09/20/22 [id number]    Regan Lemming, MD 09/20/22 [id number]
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
## 2022-09-20 NOTE — Progress Notes (Signed)
Inpatient Behavioral Health Placement  Pt meets inpatient criteria per Michaele Offer, PMHNP. There are no available beds within the [facility] system per [facility] Oris Drone, RN. Referral was sent to the following facilities;  Destination  Service Provider Address Phone Fax  [facility] [place]  [place], [place] [address] [phone] [place] [address] [phone]  [facility]  [place], [place] [address] [phone]  [facility] [place] [address] [place]  [facility]  [address], [place] [address] [phone]  [facility]  [address] [place], [place] [address] [phone] [facility]  [address] [place] [address] [place]  [facility]  [address] [place] [address] [phone]  [facility]  [address], [place] [address] [phone]  [place] [address] [place] [address] [id number]  [facility]  [address] [place] [address] [phone] [place]  [address], [place] [address] [phone] [facility]  [address], [place] [address] [phone]  [facility]  [address], [place] [address] [phone] [facility]  [address] [place] [address] [phone] [facility]  [place] [address] [phone] [facility]  [address], [place] [address] [id number]  [facility]  [address] [place], [place] [address] [phone] [facility]  [address] [place] [address] [phone]  [facility]  [address] [place], [place] [address] [phone]  [facility]  [address] [place] [address] [phone] [place], [place] [phone] [place]  [facility]  [address] [place] [address] [phone] [place]  [address] [place] [address] [phone]  [place]  [place], [place] [address] [phone]  Current Capacity last updated by Benjaman Kindler, LCSW on 09/15/2022 2324   There are no beds this shift        [facility]  [address] [place] [address] [place]  [place], [place]  [address] [phone]    Situation ongoing,  CSW will follow up.   Benjaman Kindler, MSW, [facility] 09/20/2022  @ 4:13 PM

## 2022-09-20 NOTE — ED Notes (Addendum)
Pt was accepted to [facility] 09/20/22; [facility]  Pt meets inpatient criteria per Michaele Offer, [place]   Attending Physician will be Dr Leta Jungling   Report can be called to:[phone]-Pager number, please leave a returned phone number to receive a phone call back.   BED IS READY NOW for pt to Transfer.   Accepting RN paged as directed to [phone] waiting return call, message left on [facility] answer service related to need for transport IVC pt to [facility].

## 2022-09-20 NOTE — ED Notes (Signed)
[facility] returned page for report.  Upon reviewing chart they state they are unable to accept this pt due to knee fracture and need for immobilizer.  EDP notified, will follow up with psych team.

## 2022-09-20 NOTE — ED Notes (Signed)
[name] is currently asleep with no signs of distress no tremors , complaint if pain, or night sweats have been observed . Will reassess pt at 0600 vital signs as she appears to be sleeping soundly.

## 2022-09-20 NOTE — Progress Notes (Signed)
Pt was accepted to [facility] 09/20/22; Main Campus  Pt meets inpatient criteria per Michaele Offer, [place]   Attending Physician will be Dr Leta Jungling   Report can be called to:[phone]-Pager number, please leave a returned phone number to receive a phone call back.   BED IS READY NOW for pt to Transfer  Nursing notified: Jeanie Sewer, RN  [address], [place] 09/20/2022 @ 2:11 AM
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
## 2022-09-20 NOTE — Progress Notes (Signed)
Inpatient Behavioral Health Placement  Pt meets inpatient criteria per Michaele Offer, PMHNP. There are no available beds within the [facility].  Referral was sent to the following facilities;  Destination  Service Provider Address Phone Fax  [facility]  [place], [place] [address] [phone] [place] [address] [place] [address] [phone]  [facility]  [place], [place] [address] [phone]  [facility] [place] [address] [place]  [facility]  [address] [place] [address] [phone]  [facility]  [address] [place], [place] [address] [phone] [facility]  [address] [place] [address] [place]  [facility]  [address] [place] [address] [phone]  [facility]  [address], [place] [address] [phone]  [place] [address] [place] [address] [id number]  [facility]  [address] [place] [address] [phone] [place]  [address], [place] [address] [phone] [facility]  [address], [place] [address] [phone]  [facility]  [address], [place] [address] [phone] [facility]  [address] [place] [address] [phone] [facility]  [place] [address] [phone] [facility]  [address] [place]  [address] [id number]  [facility]  [address], [place] [address] [phone] [facility]  [place], [place] [address] [phone]  [facility]  [address] [place], [place] [address] [phone]  [facility]  [address] [place] [address] [phone]    Situation ongoing,  CSW will follow up.   Benjaman Kindler, MSW, LCSWA 09/20/2022  @ 1:39 AM

## 2022-09-21 DIAGNOSIS — F250 Schizoaffective disorder, bipolar type: Secondary | ICD-10-CM | POA: Diagnosis not present

## 2022-09-21 DIAGNOSIS — R451 Restlessness and agitation: Secondary | ICD-10-CM | POA: Diagnosis not present

## 2022-09-21 DIAGNOSIS — S82101S Unspecified fracture of upper end of right tibia, sequela: Secondary | ICD-10-CM | POA: Diagnosis not present

## 2022-09-21 DIAGNOSIS — R000 Tachycardia, unspecified: Secondary | ICD-10-CM | POA: Diagnosis not present

## 2022-09-21 MED ORDER — HYDROXYZINE HCL 25 MG PO TABS
25.0000 mg | ORAL_TABLET | Freq: Once | ORAL | Status: AC
  Administered 2022-09-21: 25 mg via ORAL
  Filled 2022-09-21: qty 1

## 2022-09-21 NOTE — ED Provider Notes (Signed)
Emergency Medicine Observation Re-evaluation Note  [name] is a 57 y.o. female, seen on rounds today.  Pt initially presented to the ED for complaints of Psychiatric Evaluation Currently, the patient is resting.  Physical Exam  BP 134/85   Pulse (!) 122   Temp 99 F (37.2 C) (Oral)   Resp 18   Ht 5\' 2"  (1.575 m)   Wt 53.5 kg   LMP 01/08/2011   SpO2 95%   BMI 21.58 kg/m  Physical Exam General: NAD Cardiac: well perfused Lungs: even and unlabored Psych: no agitation  ED Course / MDM  EKG:EKG Interpretation  Date/Time:  Friday September 19 2022 01:52:28 EDT Ventricular Rate:  108 PR Interval:  141 QRS Duration: 77 QT Interval:  327 QTC Calculation: 439 R Axis:   48 Text Interpretation: Sinus tachycardia Probable left atrial enlargement Confirmed by Molpus, John [phone]) on 09/20/2022 4:27:48 AM  I have reviewed the labs performed to date as well as medications administered while in observation.  Recent changes in the last 24 hours include re-eval by psych, pt still meets inpatient criteria.  Plan  Current plan is for inpatient psychiatric hospitalization.    Regan Lemming, MD 09/21/22 1057

## 2022-09-21 NOTE — ED Notes (Signed)
Patient has been alert this shift. Patient has bee cooperative with care.  Patient has been medication cooperative. No aggression noted.  No delusions noted.  No suicidal ideation noted.

## 2022-09-21 NOTE — Progress Notes (Signed)
Inpatient Behavioral Health Placement  Pt meets inpatient criteria per Vesta Mixer, NP. There are no available beds at [facility] system per [place], RN.  Referral was sent to the following facilities;    Destination  Service Provider Address Phone Fax  [facility]  [place], [place] [address] [phone] [place] [address] [place] [address] [phone]  [facility]  [address], [place] [address] [phone]  [facility] [place] [address] [place]  [facility]  [address] [place] [address] [phone]  [facility]  [address] [place], [place] [address] [phone] [facility]  [address] [place] [address] [place]  [facility]  [address] [place] [address] [phone]  [facility]  [address], [place] [address] [phone]  [place] [address], [place] [address] [id number]  [facility]  [address] [place] [address] [phone] [place]  [address] [place] [address] [phone] [facility]  [address] [place] [address] [phone]  [facility]  [address], [place] [address] [phone] [facility]  [address], [place] [address] [phone] [facility]  [place] [address] [phone] [facility]  [address] [place] [address] [id number]  [facility]  [address], [place] [address] [phone] [facility]  [address] [place] [address] [phone]  [facility]  [address] [place], [place] [address] [phone]  [facility]  [address], [place] [address] [phone] [place]  [address], [place] [phone] [place]  [facility]  [address] [place] [address] [phone] [place]  [address], [place] [address] [phone] [place]  [place], [place] [address] [place]      [facility]  [address] [place] [address] [place]  [place]  [address] [place] [address] [phone]  [facility]  [address] [place] [address] [phone]   Situation ongoing,  CSW will follow up.   Benjaman Kindler, MSW, [facility] 09/21/2022  @ 6:17 PM
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
## 2022-09-22 DIAGNOSIS — F250 Schizoaffective disorder, bipolar type: Secondary | ICD-10-CM

## 2022-09-22 DIAGNOSIS — S82101S Unspecified fracture of upper end of right tibia, sequela: Secondary | ICD-10-CM | POA: Diagnosis not present

## 2022-09-22 DIAGNOSIS — R000 Tachycardia, unspecified: Secondary | ICD-10-CM | POA: Diagnosis not present

## 2022-09-22 DIAGNOSIS — R451 Restlessness and agitation: Secondary | ICD-10-CM | POA: Diagnosis not present

## 2022-09-22 LAB — HEMOGLOBIN A1C
Hgb A1c MFr Bld: 7.3 % — ABNORMAL HIGH (ref 4.8–5.6)
Mean Plasma Glucose: 163 mg/dL
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
## 2022-09-22 NOTE — ED Notes (Signed)
Pt has taken off knee immobilizer and refusing to put it back on. Pt threw knee immobilizer in the trash can.

## 2022-09-22 NOTE — ED Provider Notes (Signed)
Emergency Medicine Observation Re-evaluation Note  [name] is a 57 y.o. female, seen on rounds today.  Pt initially presented to the ED for complaints of Psychiatric Evaluation Currently, the patient is awaiting placement.  Physical Exam  BP 120/76   Pulse (!) 102   Temp 98.8 F (37.1 C)   Resp 17   Ht 5\' 2"  (1.575 m)   Wt 53.5 kg   LMP 01/08/2011   SpO2 97%   BMI 21.58 kg/m  Physical Exam Alert in no acute distress  ED Course / MDM  EKG:EKG Interpretation  Date/Time:  Friday September 19 2022 01:52:28 EDT Ventricular Rate:  108 PR Interval:  141 QRS Duration: 77 QT Interval:  327 QTC Calculation: 439 R Axis:   48 Text Interpretation: Sinus tachycardia Probable left atrial enlargement Confirmed by Molpus, John [phone]) on 09/20/2022 4:27:48 AM  I have reviewed the labs performed to date as well as medications administered while in observation.  Recent changes in the last 24 hours include none.  Plan  Current plan is for IVC placement.    Milton Ferguson, MD 09/22/22 [phone]

## 2022-09-23 ENCOUNTER — Encounter (HOSPITAL_COMMUNITY): Payer: Self-pay | Admitting: Registered Nurse

## 2022-09-23 DIAGNOSIS — S82101S Unspecified fracture of upper end of right tibia, sequela: Secondary | ICD-10-CM | POA: Diagnosis not present

## 2022-09-23 DIAGNOSIS — R000 Tachycardia, unspecified: Secondary | ICD-10-CM | POA: Diagnosis not present

## 2022-09-23 DIAGNOSIS — F250 Schizoaffective disorder, bipolar type: Secondary | ICD-10-CM | POA: Diagnosis not present

## 2022-09-23 DIAGNOSIS — R451 Restlessness and agitation: Secondary | ICD-10-CM | POA: Diagnosis not present
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
## 2022-09-23 NOTE — Discharge Instructions (Addendum)
Please call the orthopedic surgeon to whom your previously referred for your knee fracture. Continue to use your knee immobilizer and do not place weight on that knee          SUBSTANCE USE TREATMENT for Medicaid/Medicare and State Funded/IPRS  [facility] ([facility]) [place], [place], [address] [phone] phone NOTE: [facility] is no longer offering IOP services.  Serves those who are low-income or have no insurance.  [facility] [address], [place], [place], [address] [phone] phone [phone] fax NOTE: Does have Substance Abuse-Intensive Outpatient Program [facility]) as well as transitional housing if eligible.  [place], [place], [address] [phone] phone [phone] fax  [place] [phone] [address] [place], [place], [address] [phone] phone [phone] faxSubstance Abuse Treatment Programs  Intensive Outpatient Programs [facility] Services     [address] [place], [place]       [facility] [place] [address] [place], [place]  [facility]     (Inpatient and outpatient)     [address]           [place] [phone] (Suboxone and Methadone)  [place], [place] [address]      [place] [address] [phone] [place], [place]  [facility] [place] (Outpatient/Inpatient, [place])    (insurance only) [phone]             [facility] ([place]) [place], [place]     [facility]     [address]     [place], [place]       [facility] (for caregivers and family) [phone] [address] Kristeen Mans. [place], [place]      Residential Treatment Programs [facility]      [address], [place],  [place] [address]  [phone]       [facility] [address] [place], [place] [address] [phone]  [facility] [place]        [phone]       [facility] [place] [phone]  [facility] ([place].)             [place], [place] or [place] of [place], [address] [phone]  [facility] [place]    [address]      [place], [place]  [facility] [place], [place]  [place]   [address] [place], [place] [address]     [phone]      Admissions: 8am-3pm M-F  [facility] ([facility]) [address] [place], [place]  [facility] Program: Residential Program [phone] Days)   [place], [place] or [phone]     [facility]: [facility] [place], [place] (Walk in Hours over the weekend or by referral)  [facility] [place], [place], [place] [address] [phone]  Crisis Mobile: [facility]:  [phone] (for crisis response 24 hours a day) [facility] Hotline:      ([phone] Outpatient Psychiatry and Counseling  [facility]: Mobile Crisis Management 24 hours:  [phone]  [facility] of the [employer] sliding scale fee and walk in schedule: M-F 8am-12pm/1pm-3pm [place], [place] [address] [place], [place] [address] [phone]  [facility] ([facility] [employer])- new patient walk-in appointments available Monday - Friday 8am -3pm.          [address] [place], [place] [address] [phone] or crisis line- [facility]/ Intensive Outpatient Therapy Program [place], [place] [address] [place]      [phone] [address] [place], [place] [address]                 [place]   [facility] [phone]. [place], [place] [address]   [employer] of Care          [address] Johnette Abraham  [place], [place] [address]       [phone]  [place], [place], [place] [address] [phone]  [facility]    [address] [place], [place] [address]     [place], [place] Joycelyn Man     [place] [address]     [phone]       [facility] [place] [address]  [facility]     [address], [place], MD Arnold [place], [place] [address] [place]     [address]     [place], [place] [address]     [phone]       [facility] [address] [place], [place] [address] [phone] Resources for Temporary Residential Assistance/Crisis [facility]) M-F 8am-3pm   [address],  [address]   [phone] Services include: laundry, barbering, support groups, case management, phone  & computer access, showers, AA/NA mtgs, mental health/substance abuse nurse, job skills class, disability information, VA assistance, spiritual classes, etc.   HOMELESS [place]     [employer]   [place]  [address], [place]     [place]              [facility] (women and children)       [place]. [place], [place]  [address] [phone] [email or web address] for application and process Application Required  [facility]   [address]    [place] [address]     [phone]                    [place], [place] [address] [id number] application appt.) Application Required  [facility] (women only)    [address]     [place], [place] [address]     [phone]      Intake starts 6pm daily Need valid ID, SSC, & Police report [employer] [address] [place], [place] [id number] Application Required  [employer] (men only)     [place].      [place], [place]       [place] (Pregnant women only) [address] [place], [place]  [facility]      [place] Dani [place].      [place], [place] [address]     [phone]             [facility] [address] [place], [place] 90 day commitment/SA/Application process  [facility](men only)     [address]     [place], [place]       Check-in at [facility] [address] [place],  [address] [phone] Men/Women/Women and Children must be there by 7 pm  [place], [place]

## 2022-09-23 NOTE — Progress Notes (Signed)
Inpatient Behavioral Health Placement  Pt meets inpatient criteria per Michaele Offer, MHNP. There are no available beds within the [facility] system.   The following have been identified as barrier for inpatient behavioral health placement: -Assistance with completing ALD's -Knee immobilizer  -DME RW (2 wheels)  Discussed possible placement options outside of inpatient behavioral health placement with psych provider due to the identified barriers such as SNF placement, or Inpatient Rehabilitation, so that pt can become more medically stable and implement outpatient mental health services.   Since pt is benefiting from PT services additional options could be explored if pt does not meet criteria for SNF placement, or Inpatient Rehabilitation would be ALF placement with home health or a home health referral.   Per provider Michaele Offer, MHNP the current plan remains inpatient behavioral health placement per psych provider.  Referral was sent to the following facilities;    Destination  Service Provider Address Phone Fax  [facility] [place]  [place], [place] [address] [phone] [address] [place] [address] [phone]  [facility]  [place], [place] [address] [phone]  [facility] [place] [address] [place]  [facility]  [address], [place] [address] [phone]  [facility]  [address] [place], [place] [address] [phone] [facility]  [address] [place] [address] [place]  [facility]  [address] [place] [address] [phone]  [facility]  [address], [place] [address] [phone]  [place] [address], [place] [address] [id number]  [facility]  [address], [place] [address] [phone] [place]  [address] [place] [address] [phone] [facility]  [address] [place] [address] [phone]  [facility]  [address] [place] [address] [phone] [facility]  [address], [place] [address] [phone] [facility]  [place] [address] [phone] [facility]  [address], [place] [address] [id number]  [facility]  [address], [place] [address] [phone] [facility]  [address], [place] [address] [phone]  [facility]  [address] [place], [place] [address] [phone]  [facility]  [address], [place] [address] [phone] [place]  [address], [place] [phone] Eugenio Saenz  [facility]  [address] [place] [address] [phone] [place]  [address], [place] [address] [phone] [place]  [place], [place] [address] [id number] [phone]      [facility]  [address], [place] [address] [place]  [place]  [address] [place] [address] [phone]  [facility]  [address], [place] [address] [id number]  [facility]  [place], [place] [address] [phone]   Situation ongoing,  CSW will follow up.   Benjaman Kindler, MSW, LCSWA 09/23/2022  @ 12:58 AM

## 2022-09-24 DIAGNOSIS — J449 Chronic obstructive pulmonary disease, unspecified: Secondary | ICD-10-CM | POA: Diagnosis not present

## 2022-09-26 ENCOUNTER — Telehealth: Payer: Self-pay

## 2022-09-26 NOTE — Telephone Encounter (Signed)
        Patient  visited [place] on 4/2    Telephone encounter attempt :  2nd  A HIPAA compliant voice message was left requesting a return call.  Instructed patient to call back .    Lenard Forth [facility] Guide, [facility] [phone] [address] [place], [place], [place] [address] Phone: [phone] Email: [email or web address]

## 2022-09-26 NOTE — Telephone Encounter (Signed)
        Patient  visited [place] on 4/2     Telephone encounter attempt :  1st  A HIPAA compliant voice message was left requesting a return call.  Instructed patient to call back.    [name] [facility], [place] [phone] [address], [place], [place] [address] Phone: [phone] Email: [email or web address]       

## 2022-10-02 ENCOUNTER — Encounter (HOSPITAL_COMMUNITY): Payer: Self-pay

## 2022-10-02 ENCOUNTER — Emergency Department (HOSPITAL_COMMUNITY)
Admission: EM | Admit: 2022-10-02 | Discharge: 2022-10-02 | Discharge status: Against Medical Advice | Payer: 59 | Attending: Emergency Medicine | Admitting: Emergency Medicine

## 2022-10-02 DIAGNOSIS — Z5321 Procedure and treatment not carried out due to patient leaving prior to being seen by health care provider: Secondary | ICD-10-CM | POA: Insufficient documentation

## 2022-10-02 DIAGNOSIS — M79604 Pain in right leg: Secondary | ICD-10-CM | POA: Diagnosis not present
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
## 2022-10-12 DIAGNOSIS — J449 Chronic obstructive pulmonary disease, unspecified: Secondary | ICD-10-CM | POA: Diagnosis not present

## 2022-10-24 DIAGNOSIS — J449 Chronic obstructive pulmonary disease, unspecified: Secondary | ICD-10-CM | POA: Diagnosis not present

## 2022-10-30 ENCOUNTER — Encounter (HOSPITAL_COMMUNITY): Payer: Self-pay | Admitting: *Deleted

## 2022-11-11 DIAGNOSIS — J449 Chronic obstructive pulmonary disease, unspecified: Secondary | ICD-10-CM | POA: Diagnosis not present

## 2022-11-20 ENCOUNTER — Other Ambulatory Visit: Payer: Self-pay

## 2022-11-20 ENCOUNTER — Emergency Department (HOSPITAL_COMMUNITY): Payer: 59

## 2022-11-20 ENCOUNTER — Encounter (HOSPITAL_COMMUNITY): Payer: Self-pay

## 2022-11-20 ENCOUNTER — Observation Stay (HOSPITAL_COMMUNITY): Payer: 59

## 2022-11-20 ENCOUNTER — Inpatient Hospital Stay (HOSPITAL_COMMUNITY)
Admission: EM | Admit: 2022-11-20 | Discharge: 2022-12-01 | DRG: 193 | Disposition: A | Discharge status: Home / Self Care | Payer: 59 | Attending: Internal Medicine | Admitting: Internal Medicine

## 2022-11-20 DIAGNOSIS — J90 Pleural effusion, not elsewhere classified: Secondary | ICD-10-CM | POA: Diagnosis not present

## 2022-11-20 DIAGNOSIS — Z6821 Body mass index (BMI) 21.0-21.9, adult: Secondary | ICD-10-CM

## 2022-11-20 DIAGNOSIS — E039 Hypothyroidism, unspecified: Secondary | ICD-10-CM | POA: Diagnosis present

## 2022-11-20 DIAGNOSIS — E440 Moderate protein-calorie malnutrition: Secondary | ICD-10-CM | POA: Diagnosis present

## 2022-11-20 DIAGNOSIS — Z716 Tobacco abuse counseling: Secondary | ICD-10-CM

## 2022-11-20 DIAGNOSIS — F17210 Nicotine dependence, cigarettes, uncomplicated: Secondary | ICD-10-CM | POA: Diagnosis not present

## 2022-11-20 DIAGNOSIS — Z91199 Patient's noncompliance with other medical treatment and regimen due to unspecified reason: Secondary | ICD-10-CM

## 2022-11-20 DIAGNOSIS — I10 Essential (primary) hypertension: Secondary | ICD-10-CM | POA: Diagnosis present

## 2022-11-20 DIAGNOSIS — Z9981 Dependence on supplemental oxygen: Secondary | ICD-10-CM

## 2022-11-20 DIAGNOSIS — J690 Pneumonitis due to inhalation of food and vomit: Secondary | ICD-10-CM | POA: Diagnosis not present

## 2022-11-20 DIAGNOSIS — E1140 Type 2 diabetes mellitus with diabetic neuropathy, unspecified: Secondary | ICD-10-CM | POA: Diagnosis not present

## 2022-11-20 DIAGNOSIS — F1410 Cocaine abuse, uncomplicated: Secondary | ICD-10-CM | POA: Diagnosis present

## 2022-11-20 DIAGNOSIS — J9811 Atelectasis: Secondary | ICD-10-CM | POA: Diagnosis not present

## 2022-11-20 DIAGNOSIS — J9601 Acute respiratory failure with hypoxia: Secondary | ICD-10-CM | POA: Diagnosis present

## 2022-11-20 DIAGNOSIS — Z880 Allergy status to penicillin: Secondary | ICD-10-CM

## 2022-11-20 DIAGNOSIS — F10939 Alcohol use, unspecified with withdrawal, unspecified: Secondary | ICD-10-CM | POA: Diagnosis present

## 2022-11-20 DIAGNOSIS — J440 Chronic obstructive pulmonary disease with acute lower respiratory infection: Secondary | ICD-10-CM | POA: Diagnosis present

## 2022-11-20 DIAGNOSIS — Z1152 Encounter for screening for COVID-19: Secondary | ICD-10-CM

## 2022-11-20 DIAGNOSIS — Z833 Family history of diabetes mellitus: Secondary | ICD-10-CM

## 2022-11-20 DIAGNOSIS — F10931 Alcohol use, unspecified with withdrawal delirium: Secondary | ICD-10-CM | POA: Diagnosis not present

## 2022-11-20 DIAGNOSIS — Z781 Physical restraint status: Secondary | ICD-10-CM

## 2022-11-20 DIAGNOSIS — J449 Chronic obstructive pulmonary disease, unspecified: Secondary | ICD-10-CM | POA: Diagnosis present

## 2022-11-20 DIAGNOSIS — Z791 Long term (current) use of non-steroidal anti-inflammatories (NSAID): Secondary | ICD-10-CM

## 2022-11-20 DIAGNOSIS — F419 Anxiety disorder, unspecified: Secondary | ICD-10-CM | POA: Diagnosis present

## 2022-11-20 DIAGNOSIS — E11649 Type 2 diabetes mellitus with hypoglycemia without coma: Secondary | ICD-10-CM | POA: Diagnosis not present

## 2022-11-20 DIAGNOSIS — F1120 Opioid dependence, uncomplicated: Secondary | ICD-10-CM | POA: Diagnosis present

## 2022-11-20 DIAGNOSIS — G9341 Metabolic encephalopathy: Secondary | ICD-10-CM | POA: Diagnosis not present

## 2022-11-20 DIAGNOSIS — E119 Type 2 diabetes mellitus without complications: Secondary | ICD-10-CM

## 2022-11-20 DIAGNOSIS — I480 Paroxysmal atrial fibrillation: Secondary | ICD-10-CM | POA: Diagnosis not present

## 2022-11-20 DIAGNOSIS — Z886 Allergy status to analgesic agent status: Secondary | ICD-10-CM

## 2022-11-20 DIAGNOSIS — Z9884 Bariatric surgery status: Secondary | ICD-10-CM

## 2022-11-20 DIAGNOSIS — G894 Chronic pain syndrome: Secondary | ICD-10-CM | POA: Diagnosis present

## 2022-11-20 DIAGNOSIS — F10930 Alcohol use, unspecified with withdrawal, uncomplicated: Secondary | ICD-10-CM | POA: Diagnosis not present

## 2022-11-20 DIAGNOSIS — Z8249 Family history of ischemic heart disease and other diseases of the circulatory system: Secondary | ICD-10-CM

## 2022-11-20 DIAGNOSIS — F250 Schizoaffective disorder, bipolar type: Secondary | ICD-10-CM | POA: Diagnosis present

## 2022-11-20 DIAGNOSIS — J441 Chronic obstructive pulmonary disease with (acute) exacerbation: Secondary | ICD-10-CM | POA: Diagnosis not present

## 2022-11-20 DIAGNOSIS — E785 Hyperlipidemia, unspecified: Secondary | ICD-10-CM | POA: Diagnosis not present

## 2022-11-20 DIAGNOSIS — G40909 Epilepsy, unspecified, not intractable, without status epilepticus: Secondary | ICD-10-CM | POA: Diagnosis present

## 2022-11-20 DIAGNOSIS — J9622 Acute and chronic respiratory failure with hypercapnia: Secondary | ICD-10-CM | POA: Diagnosis not present

## 2022-11-20 DIAGNOSIS — F32A Depression, unspecified: Secondary | ICD-10-CM | POA: Diagnosis present

## 2022-11-20 DIAGNOSIS — Z90710 Acquired absence of both cervix and uterus: Secondary | ICD-10-CM

## 2022-11-20 DIAGNOSIS — Z825 Family history of asthma and other chronic lower respiratory diseases: Secondary | ICD-10-CM

## 2022-11-20 DIAGNOSIS — J122 Parainfluenza virus pneumonia: Secondary | ICD-10-CM | POA: Diagnosis present

## 2022-11-20 DIAGNOSIS — Z888 Allergy status to other drugs, medicaments and biological substances status: Secondary | ICD-10-CM

## 2022-11-20 DIAGNOSIS — G9340 Encephalopathy, unspecified: Secondary | ICD-10-CM | POA: Diagnosis not present

## 2022-11-20 DIAGNOSIS — R0602 Shortness of breath: Secondary | ICD-10-CM | POA: Diagnosis not present

## 2022-11-20 DIAGNOSIS — E1165 Type 2 diabetes mellitus with hyperglycemia: Secondary | ICD-10-CM | POA: Diagnosis present

## 2022-11-20 DIAGNOSIS — R0600 Dyspnea, unspecified: Secondary | ICD-10-CM | POA: Diagnosis not present

## 2022-11-20 DIAGNOSIS — J189 Pneumonia, unspecified organism: Secondary | ICD-10-CM | POA: Diagnosis present

## 2022-11-20 DIAGNOSIS — F10231 Alcohol dependence with withdrawal delirium: Secondary | ICD-10-CM | POA: Diagnosis present

## 2022-11-20 DIAGNOSIS — R000 Tachycardia, unspecified: Secondary | ICD-10-CM | POA: Diagnosis not present

## 2022-11-20 DIAGNOSIS — J9621 Acute and chronic respiratory failure with hypoxia: Secondary | ICD-10-CM | POA: Diagnosis present

## 2022-11-20 DIAGNOSIS — J9602 Acute respiratory failure with hypercapnia: Secondary | ICD-10-CM | POA: Diagnosis not present

## 2022-11-20 DIAGNOSIS — Z79899 Other long term (current) drug therapy: Secondary | ICD-10-CM

## 2022-11-20 DIAGNOSIS — Z96641 Presence of right artificial hip joint: Secondary | ICD-10-CM | POA: Diagnosis present

## 2022-11-20 DIAGNOSIS — Z9151 Personal history of suicidal behavior: Secondary | ICD-10-CM

## 2022-11-20 DIAGNOSIS — R739 Hyperglycemia, unspecified: Secondary | ICD-10-CM | POA: Diagnosis not present

## 2022-11-20 LAB — I-STAT ARTERIAL BLOOD GAS, ED
Acid-Base Excess: 2 mmol/L (ref 0.0–2.0)
Bicarbonate: 30.3 mmol/L — ABNORMAL HIGH (ref 20.0–28.0)
Calcium, Ion: 1.16 mmol/L (ref 1.15–1.40)
HCT: 37 % (ref 36.0–46.0)
Hemoglobin: 12.6 g/dL (ref 12.0–15.0)
O2 Saturation: 90 %
Patient temperature: 98.7
Potassium: 4.4 mmol/L (ref 3.5–5.1)
Sodium: 136 mmol/L (ref 135–145)
TCO2: 32 mmol/L (ref 22–32)
pCO2 arterial: 64.9 mmHg — ABNORMAL HIGH (ref 32–48)
pH, Arterial: 7.277 — ABNORMAL LOW (ref 7.35–7.45)
pO2, Arterial: 69 mmHg — ABNORMAL LOW (ref 83–108)

## 2022-11-20 LAB — RESPIRATORY PANEL BY PCR
Adenovirus: NOT DETECTED
Bordetella Parapertussis: NOT DETECTED
Bordetella pertussis: NOT DETECTED
Chlamydophila pneumoniae: NOT DETECTED
Coronavirus 229E: NOT DETECTED
Coronavirus HKU1: NOT DETECTED
Coronavirus NL63: NOT DETECTED
Coronavirus OC43: NOT DETECTED
Influenza A: NOT DETECTED
Influenza B: NOT DETECTED
Metapneumovirus: NOT DETECTED
Mycoplasma pneumoniae: NOT DETECTED
Parainfluenza Virus 1: NOT DETECTED
Parainfluenza Virus 2: NOT DETECTED
Parainfluenza Virus 3: DETECTED — AB
Parainfluenza Virus 4: NOT DETECTED
Respiratory Syncytial Virus: NOT DETECTED
Rhinovirus / Enterovirus: NOT DETECTED

## 2022-11-20 LAB — BASIC METABOLIC PANEL
Anion gap: 13 (ref 5–15)
BUN: <5 mg/dL — ABNORMAL LOW (ref 6–20)
CO2: 25 mmol/L (ref 22–32)
Calcium: 8.2 mg/dL — ABNORMAL LOW (ref 8.9–10.3)
Chloride: 95 mmol/L — ABNORMAL LOW (ref 98–111)
Creatinine, Ser: 0.53 mg/dL (ref 0.44–1.00)
GFR, Estimated: >60 mL/min (ref >60)
Glucose, Bld: 262 mg/dL — ABNORMAL HIGH (ref 70–99)
Potassium: 3.9 mmol/L (ref 3.5–5.1)
Sodium: 133 mmol/L — ABNORMAL LOW (ref 135–145)

## 2022-11-20 LAB — I-STAT VENOUS BLOOD GAS, ED
Acid-Base Excess: 4 mmol/L — ABNORMAL HIGH (ref 0.0–2.0)
Acid-base deficit: 1 mmol/L (ref 0.0–2.0)
Bicarbonate: 28.4 mmol/L — ABNORMAL HIGH (ref 20.0–28.0)
Bicarbonate: 28.9 mmol/L — ABNORMAL HIGH (ref 20.0–28.0)
Calcium, Ion: 0.95 mmol/L — ABNORMAL LOW (ref 1.15–1.40)
Calcium, Ion: 1.14 mmol/L — ABNORMAL LOW (ref 1.15–1.40)
HCT: 38 % (ref 36.0–46.0)
HCT: 38 % (ref 36.0–46.0)
Hemoglobin: 12.9 g/dL (ref 12.0–15.0)
Hemoglobin: 12.9 g/dL (ref 12.0–15.0)
O2 Saturation: 86 %
O2 Saturation: 95 %
Potassium: 4 mmol/L (ref 3.5–5.1)
Potassium: 4 mmol/L (ref 3.5–5.1)
Sodium: 134 mmol/L — ABNORMAL LOW (ref 135–145)
Sodium: 137 mmol/L (ref 135–145)
TCO2: 30 mmol/L (ref 22–32)
TCO2: 31 mmol/L (ref 22–32)
pCO2, Ven: 39.8 mmHg — ABNORMAL LOW (ref 44–60)
pCO2, Ven: 74.1 mmHg (ref 44–60)
pH, Ven: 7.461 — ABNORMAL HIGH (ref 7.25–7.43)
pH, Ven: 7.199 — CL (ref 7.25–7.43)
pO2, Ven: 49 mmHg — ABNORMAL HIGH (ref 32–45)
pO2, Ven: 99 mmHg — ABNORMAL HIGH (ref 32–45)

## 2022-11-20 LAB — CBC
HCT: 35.5 % — ABNORMAL LOW (ref 36.0–46.0)
Hemoglobin: 10.3 g/dL — ABNORMAL LOW (ref 12.0–15.0)
MCH: 22.5 pg — ABNORMAL LOW (ref 26.0–34.0)
MCHC: 29 g/dL — ABNORMAL LOW (ref 30.0–36.0)
MCV: 77.7 fL — ABNORMAL LOW (ref 80.0–100.0)
Platelets: 238 10*3/uL (ref 150–400)
RBC: 4.57 MIL/uL (ref 3.87–5.11)
RDW: 17.4 % — ABNORMAL HIGH (ref 11.5–15.5)
WBC: 9.7 10*3/uL (ref 4.0–10.5)
nRBC: 0 % (ref 0.0–0.2)

## 2022-11-20 LAB — CBG MONITORING, ED: Glucose-Capillary: 309 mg/dL — ABNORMAL HIGH (ref 70–99)

## 2022-11-20 LAB — GLUCOSE, CAPILLARY
Glucose-Capillary: 148 mg/dL — ABNORMAL HIGH (ref 70–99)
Glucose-Capillary: 318 mg/dL — ABNORMAL HIGH (ref 70–99)
Glucose-Capillary: 375 mg/dL — ABNORMAL HIGH (ref 70–99)

## 2022-11-20 LAB — SARS CORONAVIRUS 2 BY RT PCR: SARS Coronavirus 2 by RT PCR: NEGATIVE

## 2022-11-20 LAB — TROPONIN I (HIGH SENSITIVITY)
Troponin I (High Sensitivity): 5 ng/L (ref <18)
Troponin I (High Sensitivity): 6 ng/L (ref <18)

## 2022-11-20 LAB — D-DIMER, QUANTITATIVE: D-Dimer, Quant: 0.77 ug/mL-FEU — ABNORMAL HIGH (ref 0.00–0.50)

## 2022-11-20 LAB — BRAIN NATRIURETIC PEPTIDE: B Natriuretic Peptide: 84.2 pg/mL (ref 0.0–100.0)

## 2022-11-20 LAB — MRSA NEXT GEN BY PCR, NASAL: MRSA by PCR Next Gen: NOT DETECTED

## 2022-11-20 MED ORDER — SODIUM CHLORIDE 0.9 % IV SOLN
500.0000 mg | INTRAVENOUS | Status: DC
  Administered 2022-11-21: 500 mg via INTRAVENOUS
  Filled 2022-11-20: qty 5

## 2022-11-20 MED ORDER — IPRATROPIUM-ALBUTEROL 0.5-2.5 (3) MG/3ML IN SOLN
3.0000 mL | Freq: Once | RESPIRATORY_TRACT | Status: AC
  Administered 2022-11-20: 3 mL via RESPIRATORY_TRACT
  Filled 2022-11-20: qty 3

## 2022-11-20 MED ORDER — THIAMINE HCL 100 MG/ML IJ SOLN
100.0000 mg | Freq: Every day | INTRAMUSCULAR | Status: DC
  Administered 2022-11-22: 100 mg via INTRAVENOUS
  Filled 2022-11-20: qty 2

## 2022-11-20 MED ORDER — NALOXONE HCL 0.4 MG/ML IJ SOLN: 0.4000 mg | INTRAMUSCULAR | Status: DC | PRN

## 2022-11-20 MED ORDER — ACETAMINOPHEN 325 MG PO TABS
650.0000 mg | ORAL_TABLET | Freq: Four times a day (QID) | ORAL | Status: DC | PRN
  Administered 2022-11-21 – 2022-12-01 (×14): 650 mg via ORAL
  Filled 2022-11-20 (×17): qty 2

## 2022-11-20 MED ORDER — ALBUTEROL SULFATE HFA 108 (90 BASE) MCG/ACT IN AERS: 1.0000 | INHALATION_SPRAY | RESPIRATORY_TRACT | Status: DC | PRN

## 2022-11-20 MED ORDER — FLUMAZENIL 0.5 MG/5ML IV SOLN: 0.2000 mg | Freq: Once | INTRAVENOUS | Status: DC

## 2022-11-20 MED ORDER — METHYLPREDNISOLONE SODIUM SUCC 125 MG IJ SOLR
125.0000 mg | Freq: Once | INTRAMUSCULAR | Status: AC
  Administered 2022-11-20: 125 mg via INTRAVENOUS
  Filled 2022-11-20: qty 2

## 2022-11-20 MED ORDER — REVEFENACIN 175 MCG/3ML IN SOLN
175.0000 ug | Freq: Every day | RESPIRATORY_TRACT | Status: DC
  Administered 2022-11-21 – 2022-12-01 (×10): 175 ug via RESPIRATORY_TRACT
  Filled 2022-11-20 (×11): qty 3

## 2022-11-20 MED ORDER — THIAMINE MONONITRATE 100 MG PO TABS
100.0000 mg | ORAL_TABLET | Freq: Every day | ORAL | Status: DC
  Administered 2022-11-20 – 2022-11-24 (×4): 100 mg via ORAL
  Filled 2022-11-20 (×5): qty 1

## 2022-11-20 MED ORDER — SODIUM CHLORIDE 0.9 % IV SOLN
500.0000 mg | Freq: Once | INTRAVENOUS | Status: AC
  Administered 2022-11-20: 500 mg via INTRAVENOUS
  Filled 2022-11-20: qty 5

## 2022-11-20 MED ORDER — OLANZAPINE 10 MG IM SOLR: 2.5000 mg | Freq: Four times a day (QID) | INTRAMUSCULAR | Status: DC | PRN

## 2022-11-20 MED ORDER — NALOXONE HCL 2 MG/2ML IJ SOSY
PREFILLED_SYRINGE | INTRAMUSCULAR | Status: AC
  Filled 2022-11-20: qty 2

## 2022-11-20 MED ORDER — LORAZEPAM 2 MG/ML IJ SOLN
INTRAMUSCULAR | Status: AC
  Filled 2022-11-20: qty 1

## 2022-11-20 MED ORDER — BUDESONIDE 0.5 MG/2ML IN SUSP
2.0000 mg | Freq: Two times a day (BID) | RESPIRATORY_TRACT | Status: DC
  Administered 2022-11-20 – 2022-11-21 (×2): 2 mg via RESPIRATORY_TRACT
  Administered 2022-11-21 – 2022-11-22 (×2): 0.5 mg via RESPIRATORY_TRACT
  Filled 2022-11-20 (×4): qty 8

## 2022-11-20 MED ORDER — IPRATROPIUM-ALBUTEROL 0.5-2.5 (3) MG/3ML IN SOLN
3.0000 mL | Freq: Four times a day (QID) | RESPIRATORY_TRACT | Status: DC
  Administered 2022-11-20: 3 mL via RESPIRATORY_TRACT
  Filled 2022-11-20: qty 3

## 2022-11-20 MED ORDER — SODIUM CHLORIDE 0.9 % IV BOLUS
1000.0000 mL | Freq: Once | INTRAVENOUS | Status: AC
  Administered 2022-11-20: 1000 mL via INTRAVENOUS

## 2022-11-20 MED ORDER — ACETAMINOPHEN 650 MG RE SUPP: 650.0000 mg | Freq: Four times a day (QID) | RECTAL | Status: DC | PRN

## 2022-11-20 MED ORDER — ARFORMOTEROL TARTRATE 15 MCG/2ML IN NEBU
15.0000 ug | INHALATION_SOLUTION | Freq: Two times a day (BID) | RESPIRATORY_TRACT | Status: DC
  Administered 2022-11-20 – 2022-12-01 (×20): 15 ug via RESPIRATORY_TRACT
  Filled 2022-11-20 (×22): qty 2

## 2022-11-20 MED ORDER — ADULT MULTIVITAMIN W/MINERALS CH
1.0000 | ORAL_TABLET | Freq: Every day | ORAL | Status: DC
  Administered 2022-11-20 – 2022-11-21 (×2): 1 via ORAL
  Filled 2022-11-20 (×2): qty 1

## 2022-11-20 MED ORDER — LORAZEPAM 2 MG/ML IJ SOLN
1.0000 mg | INTRAMUSCULAR | Status: DC | PRN
  Administered 2022-11-20: 2 mg via INTRAVENOUS
  Administered 2022-11-20: 4 mg via INTRAVENOUS
  Filled 2022-11-20: qty 2

## 2022-11-20 MED ORDER — ENOXAPARIN SODIUM 40 MG/0.4ML IJ SOSY
40.0000 mg | PREFILLED_SYRINGE | INTRAMUSCULAR | Status: DC
  Administered 2022-11-20 – 2022-11-30 (×11): 40 mg via SUBCUTANEOUS
  Filled 2022-11-20 (×11): qty 0.4

## 2022-11-20 MED ORDER — INSULIN ASPART 100 UNIT/ML IJ SOLN: 0.0000 [IU] | Freq: Three times a day (TID) | INTRAMUSCULAR | Status: DC

## 2022-11-20 MED ORDER — DEXMEDETOMIDINE HCL IN NACL 400 MCG/100ML IV SOLN
0.0000 ug/kg/h | INTRAVENOUS | Status: DC
  Administered 2022-11-20: 0.4 ug/kg/h via INTRAVENOUS
  Administered 2022-11-20: 0.6 ug/kg/h via INTRAVENOUS
  Administered 2022-11-21: 0.4 ug/kg/h via INTRAVENOUS
  Filled 2022-11-20 (×2): qty 100

## 2022-11-20 MED ORDER — SODIUM CHLORIDE 0.9 % IV SOLN
2.0000 g | Freq: Once | INTRAVENOUS | Status: AC
  Administered 2022-11-20: 2 g via INTRAVENOUS
  Filled 2022-11-20: qty 20

## 2022-11-20 MED ORDER — SODIUM CHLORIDE 0.9 % IV SOLN
1.0000 g | INTRAVENOUS | Status: DC
  Administered 2022-11-21: 1 g via INTRAVENOUS
  Filled 2022-11-20: qty 10

## 2022-11-20 MED ORDER — NICOTINE 21 MG/24HR TD PT24
21.0000 mg | MEDICATED_PATCH | Freq: Every day | TRANSDERMAL | Status: DC
  Administered 2022-11-20 – 2022-11-30 (×11): 21 mg via TRANSDERMAL
  Filled 2022-11-20 (×12): qty 1

## 2022-11-20 MED ORDER — LORAZEPAM 2 MG/ML IJ SOLN
1.0000 mg | Freq: Once | INTRAMUSCULAR | Status: AC
  Administered 2022-11-20: 0.5 mg via INTRAVENOUS
  Filled 2022-11-20: qty 1

## 2022-11-20 MED ORDER — LORAZEPAM 1 MG PO TABS: 1.0000 mg | ORAL_TABLET | ORAL | Status: DC | PRN

## 2022-11-20 MED ORDER — FOLIC ACID 1 MG PO TABS
1.0000 mg | ORAL_TABLET | Freq: Every day | ORAL | Status: DC
  Administered 2022-11-20 – 2022-12-01 (×12): 1 mg via ORAL
  Filled 2022-11-20 (×13): qty 1

## 2022-11-20 MED ORDER — DOXYCYCLINE HYCLATE 100 MG PO TABS: 100.0000 mg | ORAL_TABLET | Freq: Two times a day (BID) | ORAL | Status: DC

## 2022-11-20 MED ORDER — LORAZEPAM 2 MG/ML IJ SOLN
1.0000 mg | Freq: Once | INTRAMUSCULAR | Status: AC
  Administered 2022-11-20: 1 mg via INTRAVENOUS
  Filled 2022-11-20: qty 1

## 2022-11-20 MED ORDER — DOCUSATE SODIUM 100 MG PO CAPS: 100.0000 mg | ORAL_CAPSULE | Freq: Two times a day (BID) | ORAL | Status: DC | PRN

## 2022-11-20 MED ORDER — PREDNISONE 20 MG PO TABS: 40.0000 mg | ORAL_TABLET | Freq: Every day | ORAL | Status: DC

## 2022-11-20 MED ORDER — INSULIN ASPART 100 UNIT/ML IJ SOLN
0.0000 [IU] | INTRAMUSCULAR | Status: DC
  Administered 2022-11-20: 15 [IU] via SUBCUTANEOUS
  Administered 2022-11-21 (×2): 3 [IU] via SUBCUTANEOUS

## 2022-11-20 MED ORDER — ALBUTEROL SULFATE (2.5 MG/3ML) 0.083% IN NEBU
2.5000 mg | INHALATION_SOLUTION | RESPIRATORY_TRACT | Status: DC | PRN
  Administered 2022-11-30 (×2): 2.5 mg via RESPIRATORY_TRACT
  Filled 2022-11-20 (×2): qty 3

## 2022-11-20 MED ORDER — GUAIFENESIN ER 600 MG PO TB12
1200.0000 mg | ORAL_TABLET | Freq: Two times a day (BID) | ORAL | Status: DC
  Administered 2022-11-20 – 2022-12-01 (×19): 1200 mg via ORAL
  Filled 2022-11-20 (×21): qty 2

## 2022-11-20 MED ORDER — MOMETASONE FURO-FORMOTEROL FUM 200-5 MCG/ACT IN AERO
2.0000 | INHALATION_SPRAY | Freq: Two times a day (BID) | RESPIRATORY_TRACT | Status: DC
  Filled 2022-11-20: qty 8.8

## 2022-11-20 MED ORDER — IOHEXOL 350 MG/ML SOLN
75.0000 mL | Freq: Once | INTRAVENOUS | Status: AC | PRN
  Administered 2022-11-20: 75 mL via INTRAVENOUS

## 2022-11-20 MED ORDER — POLYETHYLENE GLYCOL 3350 17 G PO PACK: 17.0000 g | PACK | Freq: Every day | ORAL | Status: DC | PRN

## 2022-11-20 NOTE — H&P (Addendum)
History and Physical    KYNDEL KAUR ZOX:[id number] DOB: [birth year] DOA: 11/20/2022  PCP: Patient, No Pcp Per (Confirm with patient/family/NH records and if not entered, this has to be entered at [facility] point of entry) Patient coming from: Home  I have personally briefly reviewed patient's old medical records in [facility]  Chief Complaint: Cough shortness of breath  HPI: KAYLIEE SKURKA is a 57 y.o. female with medical history significant of COPD with chronic hypoxic respiratory failure on 2 to 3 L as needed for activity at home, noncompliant with breathing medications, alcohol abuse,IIDM, chronic back pain and narcotic dependence, brought in by family member for evaluation of worsening of cough and shortness of breath.  Patient is sedated after given IV Ativan for-CIWA score unable to answer some of my questions.  She reported she has been running out of her oxygen for last 2 weeks as well as her COPD medications and started to have productive cough and shortness of breath for 7+ days denies any fever chills no chest pains.  She described the sputum as thick yellowish, she remembered had a temperature over the weekend of 101.0 but no chills.  Patient also reported daily drink of sixpacks beer every day and last drink was last night.  ED Course: Patient was found to be afebrile temperature 98.9, borderline tachycardia, blood pressure SBP 130s, O2 saturation 2.5 L 93%.  CTA chest negative for PE but diffused bronchitis.  Patient was given IV Solu-Medrol, IV antibiotics breathing treatment.  IV Ativan for new onset of alcohol withdrawal symptoms and high CIWA score.  Review of Systems: Unable to perform, patient is sedated.  Past Medical History:  Diagnosis Date   Abdominal pain    Accidental drug overdose April 2013   Anxiety    Atrial fibrillation (HCC) 09/29/11   converted spontaneously   Chronic back pain    Chronic knee pain    Chronic nausea    Chronic pain    COPD (chronic  obstructive pulmonary disease) (HCC)    Depression    Diabetes mellitus    states her doctor took her off all DM meds in past month   Diabetic neuropathy (HCC)    Dyspnea    with exertion    GERD (gastroesophageal reflux disease)    Headache(784.0)    migraines    HTN (hypertension)    not on meds since in a year    Hyperlipidemia    Hypothyroidism    not on meds in a while    Mental disorder    Bipolar and schizophrenic   Requires supplemental oxygen    as needed per patient    Schizophrenia (HCC)    Schizophrenia, acute (HCC) 11/13/2017   Tobacco abuse     Past Surgical History:  Procedure Laterality Date   ABDOMINAL HYSTERECTOMY     BLADDER SUSPENSION  03/04/2011   Procedure: [facility] PROCEDURE;  Surgeon: Jonna Coup MacDiarmid;  Location: [facility];  Service: Urology;  Laterality: N/A;   BOWEL RESECTION N/A 04/18/2022   Procedure: SMALL BOWEL RESECTION;  Surgeon: Harriette Bouillon, MD;  Location: [facility] OR;  Service: General;  Laterality: N/A;   CYSTOCELE REPAIR  03/04/2011   Procedure: ANTERIOR REPAIR (CYSTOCELE);  Surgeon: Martina Sinner;  Location: [facility];  Service: Urology;  Laterality: N/A;   CYSTOSCOPY  03/04/2011   Procedure: CYSTOSCOPY;  Surgeon: Jonna Coup MacDiarmid;  Location: [facility];  Service: Urology;  Laterality: N/A;   ESOPHAGOGASTRODUODENOSCOPY (EGD) WITH PROPOFOL N/A 05/12/2017  Procedure: ESOPHAGOGASTRODUODENOSCOPY (EGD) WITH PROPOFOL;  Surgeon: Ovidio Kin, MD;  Location: [facility];  Service: General;  Laterality: N/A;   GASTRIC ROUX-EN-Y N/A 03/25/2016   Procedure: LAPAROSCOPIC ROUX-EN-Y GASTRIC BYPASS WITH UPPER ENDOSCOPY;  Surgeon: Glenna Fellows, MD;  Location: [facility];  Service: General;  Laterality: N/A;   KNEE SURGERY     LAPAROSCOPIC ASSISTED VAGINAL HYSTERECTOMY  03/04/2011   Procedure: LAPAROSCOPIC ASSISTED VAGINAL HYSTERECTOMY;  Surgeon: Jeani Hawking, MD;  Location: [facility];  Service: Gynecology;  Laterality: N/A;   LAPAROTOMY N/A 04/18/2022    Procedure: EXPLORATORY LAPAROTOMY;  Surgeon: Harriette Bouillon, MD;  Location[facility];  Service: General;  Laterality: N/A;   LAPAROTOMY N/A 04/24/2022   Procedure: BRING BACK EXPLORATORY LAPAROTOMY;  Surgeon: Violeta Gelinas, MD;  Location: [facility];  Service: General;  Laterality: N/A;   TOTAL HIP ARTHROPLASTY Right 08/27/2022   Procedure: TOTAL HIP ARTHROPLASTY;  Surgeon: Joen Laura, MD;  [facility];  Service: Orthopedics;  Laterality: Right;     reports that she has been smoking cigarettes. She has been smoking an average of 1 pack per day. She has never used smokeless tobacco. She reports current alcohol use. She reports current drug use. Drugs: Cocaine, Marijuana, and "Crack" cocaine.  Allergies  Allergen Reactions   Iron Dextran Shortness Of Breath and Anxiety   Nsaids Other (See Comments)    H/o gastric bypass - can cause ulcers   Aspirin Nausea And Vomiting and Other (See Comments)    Ok to take tylenol or ibuprofen     Family History  Problem Relation Age of Onset   Heart attack Father        11s   Diabetes Mother    Heart disease Mother    Hypertension Mother    Heart attack Sister        68   COPD Other    Breast cancer Neg Hx      Prior to Admission medications   Medication Sig Start Date End Date Taking? Authorizing Provider  acetaminophen (TYLENOL) 500 MG tablet Take 2 tablets (1,000 mg total) by mouth every 8 (eight) hours as needed. Patient not taking: Reported on 09/21/2022 09/10/22   Jacinto Halim, PA-C  albuterol (VENTOLIN HFA) 108 (90 Base) MCG/ACT inhaler Inhale 1-2 puffs into the lungs every 4 (four) hours as needed for wheezing or shortness of breath. 11/19/22   [provider]  docusate sodium (COLACE) 100 MG capsule Take 1 capsule (100 mg total) by mouth 2 (two) times daily as needed for mild constipation. Patient not taking: Reported on 09/21/2022 09/10/22   Maczis, Elmer Sow, PA-C  enoxaparin (LOVENOX) 40 MG/0.4ML injection Inject 0.4  mLs (40 mg total) into the skin daily for 14 days. Patient not taking: Reported on 09/21/2022 09/11/22 09/25/22  Maczis, Elmer Sow, PA-C  folic acid (FOLVITE) 1 MG tablet Take 1 tablet (1 mg total) by mouth daily. Patient not taking: Reported on 09/21/2022 08/26/22   Standley Brooking, MD  meloxicam (MOBIC) 7.5 MG tablet Take 7.5 mg by mouth 2 (two) times daily as needed for pain. 09/25/22   [provider]  methocarbamol (ROBAXIN) 500 MG tablet Take 2 tablets (1,000 mg total) by mouth every 8 (eight) hours as needed for muscle spasms. Patient not taking: Reported on 09/21/2022 09/10/22   Jacinto Halim, PA-C  Multiple Vitamin (MULTIVITAMIN WITH MINERALS) TABS tablet Take 1 tablet by mouth daily. Patient not taking: Reported on 07/05/2022 06/21/22 07/21/22  Berton Mount I,  MD  nicotine (NICODERM CQ - DOSED IN MG/24 HOURS) 21 mg/24hr patch Place 1 patch (21 mg total) onto the skin daily. Patient not taking: Reported on 08/25/2022 07/11/22   Lewie Chamber, MD  oxyCODONE (OXYCONTIN) 15 mg 12 hr tablet Take 1 tablet (15 mg total) by mouth every 12 (twelve) hours as needed. Patient not taking: Reported on 09/21/2022 09/10/22   Jacinto Halim, PA-C  oxyCODONE 10 MG TABS Take 0.5-1 tablets (5-10 mg total) by mouth every 6 (six) hours as needed for breakthrough pain. Patient not taking: Reported on 09/21/2022 09/10/22   Maczis, Elmer Sow, PA-C  polyethylene glycol (MIRALAX / GLYCOLAX) 17 g packet Take 17 g by mouth daily as needed. Patient not taking: Reported on 09/21/2022 09/10/22   Jacinto Halim, PA-C  QUEtiapine (SEROQUEL XR) 200 MG 24 hr tablet Take 1 tablet (200 mg total) by mouth at bedtime. Patient not taking: Reported on 09/21/2022 09/10/22   Maczis, Elmer Sow, PA-C  senna-docusate (SENOKOT-S) 8.6-50 MG tablet Take 1 tablet by mouth at bedtime as needed for mild constipation. Patient not taking: Reported on 08/25/2022 07/10/22   Lewie Chamber, MD  thiamine (VITAMIN B-1) 100 MG tablet Take 1  tablet (100 mg total) by mouth daily. Patient not taking: Reported on 09/21/2022 08/26/22   Standley Brooking, MD  zonisamide (ZONEGRAN) 100 MG capsule Take 2 capsules (200 mg total) by mouth at bedtime. Patient not taking: Reported on 09/21/2022 09/10/22   Princella Pellegrini    Physical Exam: Vitals:   11/20/22 1300 11/20/22 1315 11/20/22 1440 11/20/22 1445  BP: (!) 174/155 (!) 144/70  134/74  Pulse: (!) 107 95 (!) 112 (!) 104  Resp: (!) 28 (!) 23  (!) 23  Temp:      TempSrc:      SpO2: 91% 95%  98%  Weight:      Height:        Constitutional: NAD, calm, comfortable Vitals:   11/20/22 1300 11/20/22 1315 11/20/22 1440 11/20/22 1445  BP: (!) 174/155 (!) 144/70  134/74  Pulse: (!) 107 95 (!) 112 (!) 104  Resp: (!) 28 (!) 23  (!) 23  Temp:      TempSrc:      SpO2: 91% 95%  98%  Weight:      Height:       Eyes: PERRL, lids and conjunctivae normal ENMT: Mucous membranes are moist. Posterior pharynx clear of any exudate or lesions.Normal dentition.  Neck: normal, supple, no masses, no thyromegaly Respiratory: Diminished breathing sound bilaterally, diffused wheezing, scattered crackles bilaterally, increasing breathing effort. No accessory muscle use.  Cardiovascular: Regular rate and rhythm, no murmurs / rubs / gallops. No extremity edema. 2+ pedal pulses. No carotid bruits.  Abdomen: no tenderness, no masses palpated. No hepatosplenomegaly. Bowel sounds positive.  Musculoskeletal: no clubbing / cyanosis. No joint deformity upper and lower extremities. Good ROM, no contractures. Normal muscle tone.  Skin: no rashes, lesions, ulcers. No induration Neurologic: CN 2-12 grossly intact. Sensation intact, DTR normal. Strength 5/5 in all 4.  Psychiatric: Oriented to person and place, confused about time   Labs on Admission: I have personally reviewed following labs and imaging studies  CBC: Recent Labs  Lab 11/20/22 1053 11/20/22 1104  WBC 9.7  --   HGB 10.3* 12.9  HCT 35.5*  38.0  MCV 77.7*  --   PLT 238  --    Basic Metabolic Panel: Recent Labs  Lab 11/20/22 1053 11/20/22 1104  NA  133* 134*  K 3.9 4.0  CL 95*  --   CO2 25  --   GLUCOSE 262*  --   BUN <5*  --   CREATININE 0.53  --   CALCIUM 8.2*  --    GFR: Estimated Creatinine Clearance: 60.6 mL/min (by C-G formula based on SCr of 0.53 mg/dL). Liver Function Tests: No results for input(s): "AST", "ALT", "ALKPHOS", "BILITOT", "PROT", "ALBUMIN" in the last 168 hours. No results for input(s): "LIPASE", "AMYLASE" in the last 168 hours. No results for input(s): "AMMONIA" in the last 168 hours. Coagulation Profile: No results for input(s): "INR", "PROTIME" in the last 168 hours. Cardiac Enzymes: No results for input(s): "CKTOTAL", "CKMB", "CKMBINDEX", "TROPONINI" in the last 168 hours. BNP (last 3 results) No results for input(s): "PROBNP" in the last 8760 hours. HbA1C: No results for input(s): "HGBA1C" in the last 72 hours. CBG: No results for input(s): "GLUCAP" in the last 168 hours. Lipid Profile: No results for input(s): "CHOL", "HDL", "LDLCALC", "TRIG", "CHOLHDL", "LDLDIRECT" in the last 72 hours. Thyroid Function Tests: No results for input(s): "TSH", "T4TOTAL", "FREET4", "T3FREE", "THYROIDAB" in the last 72 hours. Anemia Panel: No results for input(s): "VITAMINB12", "FOLATE", "FERRITIN", "TIBC", "IRON", "RETICCTPCT" in the last 72 hours. Urine analysis:    Component Value Date/Time   COLORURINE YELLOW 08/17/2022 0921   APPEARANCEUR CLEAR 08/17/2022 0921   LABSPEC 1.009 08/17/2022 0921   PHURINE 6.0 08/17/2022 0921   GLUCOSEU 50 (A) 08/17/2022 0921   HGBUR NEGATIVE 08/17/2022 0921   BILIRUBINUR NEGATIVE 08/17/2022 0921   KETONESUR NEGATIVE 08/17/2022 0921   PROTEINUR NEGATIVE 08/17/2022 0921   UROBILINOGEN 0.2 09/04/2013 0426   NITRITE NEGATIVE 08/17/2022 0921   LEUKOCYTESUR NEGATIVE 08/17/2022 1610    Radiological Exams on Admission: CT Angio Chest PE W and/or Wo  Contrast  Result Date: 11/20/2022 CLINICAL DATA:  Two-week history of shortness of breath EXAM: CT ANGIOGRAPHY CHEST WITH CONTRAST TECHNIQUE: Multidetector CT imaging of the chest was performed using the standard protocol during bolus administration of intravenous contrast. Multiplanar CT image reconstructions and MIPs were obtained to evaluate the vascular anatomy. RADIATION DOSE REDUCTION: This exam was performed according to the departmental dose-optimization program which includes automated exposure control, adjustment of the mA and/or kV according to patient size and/or use of iterative reconstruction technique. CONTRAST:  75mL OMNIPAQUE IOHEXOL 350 MG/ML SOLN COMPARISON:  CT chest dated 08/25/2022 FINDINGS: Cardiovascular: The study is high quality for the evaluation of pulmonary embolism. There are no filling defects in the central, lobar, segmental or subsegmental pulmonary artery branches to suggest acute pulmonary embolism. Main pulmonary artery measures 3.5 cm, unchanged. Normal heart size. No significant pericardial fluid/thickening. Coronary artery calcifications and aortic atherosclerosis. Mediastinum/Nodes: Imaged thyroid gland without nodules meeting criteria for imaging follow-up by size. Small hiatal hernia. No pathologically enlarged axillary, supraclavicular, mediastinal, or hilar lymph nodes. Lungs/Pleura: The central airways are patent. Diffuse peribronchial wall thickening with subsegmental mucous plugging in the left lower lobe. Scattered peribronchovascular ground-glass densities throughout. Upper lobe predominant paraseptal and centrilobular emphysema. No pneumothorax. No pleural effusion. Upper abdomen: Postsurgical changes of the stomach. Musculoskeletal: Increased sclerotic appearance of the manubrium. Unchanged compression of T9. Review of the MIP images confirms the above findings. IMPRESSION: 1. No evidence of pulmonary embolism. 2. Diffuse peribronchial wall thickening with  subsegmental mucous plugging in the left lower lobe in keeping with bronchitis. Scattered peribronchovascular ground-glass densities throughout, likely infectious or inflammatory. 3. Unchanged dilation of the main pulmonary artery, which can be seen in the  setting of pulmonary arterial hypertension. 4. Increased sclerosis of the manubrium, likely reflecting healing fracture. 5. Aortic Atherosclerosis (ICD10-I70.0) and Emphysema (ICD10-J43.9). Coronary artery calcifications. Assessment for potential risk factor modification, dietary therapy or pharmacologic therapy may be warranted, if clinically indicated. Electronically Signed   By: Agustin Cree M.D.   On: 11/20/2022 13:58   DG Chest Portable 1 View  Result Date: 11/20/2022 CLINICAL DATA:  Shortness of breath EXAM: PORTABLE CHEST 1 VIEW COMPARISON:  X-ray 08/25/2022 FINDINGS: No consolidation, pneumothorax or effusion. No edema. Normal cardiopericardial silhouette. Overlapping cardiac leads. IMPRESSION: No acute cardiopulmonary disease. Electronically Signed   By: Karen Kays M.D.   On: 11/20/2022 10:44    EKG: Independently reviewed. Sinus, no acute ST changes.  Assessment/Plan Principal Problem:   Alcohol withdrawal (HCC) Active Problems:   COPD (chronic obstructive pulmonary disease) (HCC)  (please populate well all problems here in Problem List. (For example, if patient is on BP meds at home and you resume or decide to hold them, it is a problem that needs to be her. Same for CAD, COPD, HLD and so on)  Alcohol withdrawal -Has both symptoms signs of, continue CIWA protocol with as needed benzos -Thiamine and folic acid  COPD exacerbation -Likely secondary to noncompliant with home oxygen and COPD medications.  Placed a call to patient family, and waiting for daughter's reply. -Short course of p.o. prednisone 40 mg daily, patient did receive 1 dose of IV Solu-Medrol earlier in the ED -Doxycycline p.o. twice daily -Continue ICS and LABA,  DuoNebs with as needed albuterol -Incentive spirometry  Acute metabolic encephalopathy -Likely secondary to alcohol withdrawal -Checks, ordered VBG to rule out CO2 retention.  Will follow  IIDM uncontrolled with hyperglycemia -SSI for now  Chronic pain syndrome -Continue home pain medications  P.S -Daughter Called back reporting that the patient has not been compliant with her home oxygen and COPD medications due to addiction to cocaine.  And patient has been having memory issues and episodes of confusions since last year after a attempted overdose suicide.  Family was planning to bring patient to cognitive evaluation recently.  Daughter reported that at baseline patient has been having episode of confusion and agitation at home.  DVT prophylaxis: Lovenox Code Status: Full code Family Communication: Daughter over the phone Disposition Plan: Expect less than 2 midnight hospital stay Consults called: None Admission status: Tele observation   Emeline General MD [facility] Pager [phone]  11/20/2022, 3:10 PM

## 2022-11-20 NOTE — Consult Note (Signed)
NAME:  [name], MRN:  [id number], DOB:  June 28, 1965, LOS: 0 ADMISSION DATE:  11/20/2022, CONSULTATION DATE:  11/20/2022 REFERRING MD:  Dr. Chipper Herb - [facility], CHIEF COMPLAINT:  Unresponsive    History of Present Illness:  [name] is a 57 y.o. female with a PMH significant for but not limited to HTN, HLD, schizophrenia, type 2 diabetes, chronic pain, atrial fibrillation, anxiety, alcohol abuse and tobacco abuse who presented to the ED via EMS 5/30 for complaints of shortness of breath and fever to 101F.  Patient reports shortness of breath began 2 weeks prior to admission with progressive worsening.  Also reports some cough and increased sputum production.  Patient reports that she has not been compliant with home medication and intermittently uses home oxygen.  History also taken from patient's daughter at the bedside who is a LCSW at [employer] and reports that patient is non-compliant with all her home medications and baseline confused and frequently agitated.   On admission patient was seen tachypneic, mildly tachycardic, and slightly hypertensive.  CIWA scale elevated at 20.  Lab work significant for sodium 133, chloride 96, glucose 262, BUN less than 5, creatinine 0.53, hemoglobin 10.9.  Patient was initially admitted per hospitalist for management of acute exasperation of COPD and alcohol withdrawal.  CTA chest without PE, noted LLL mucous plugging and GGO's  Shortly after admission orders placed and while still in the emergency department patient had change in mentation prompting initiation of BiPAP therapy and consult to PCCM.   She was given Flumazenil with immediate improvement but subsequent agitation, so admitted to the ICU for precedex  Pertinent  Medical History  HTN, HLD, schizophrenia, type 2 diabetes, chronic pain, atrial fibrillation, anxiety, alcohol abuse and tobacco abuse  Significant Hospital Events: Including procedures, antibiotic start and stop dates in addition to other  pertinent events   5/30 presented to the ED with complaints of worsening shortness of breath, admitted per hospitalist for acute COPD exasperation and alcohol withdrawal.  CCM consulted, responded to flumazenil, ICU for agitation and precedex  Interim History / Subjective:  As above  Objective   Blood pressure 120/67, pulse (!) 113, temperature 98.7 F (37.1 C), temperature source Oral, resp. rate (!) 24, height 5\' 2"  (1.575 m), weight 53.5 kg, last menstrual period 01/08/2011, SpO2 95 %.       No intake or output data in the 24 hours ending 11/20/22 1637 Filed Weights   11/20/22 1002  Weight: 53.5 kg   General:  thin, chronically and acutely  ill-appearing F minimally responsive to pain HEENT: MM pink/moist, pupils 1mm equal and responsive  Neuro: initially unresponsive except grimacing to pain, after flumazenil was awake, moving all extremities and agitated  CV: s1s2 rrr, no m/r/g PULM:  bronchial breath sounds bilaterally without accessory muscle use or tachypnea on [facility]: soft, bsx4 active  Extremities: warm/dry, no edema  Skin: no rashes or lesions   Resolved Hospital Problem list     Assessment & Plan:    Acute on chronic hypoxic respiratory failure secondary to COPD exacerbation vs CAP  Tobacco abuse POA Noncompliant with home supplemental oxygen per daughter -CAP coverage with Ceftriaxone and Azithromycin  -check flu, covid and RVP -not wheezing on 2L currently, received solumedrol 125mg  in the ED, hold further steroids unless CAP or wheezing worsening -albuterol, pulmicort, dulera and brovana nebs   Acute metabolic encephalopathy Alcohol withdrawal with history of daily alcohol consumption History of seizures-in the setting of drug use Placed on  CIWA in the ED and received 7mg  Ativan with subsequent respiratory depression, responded to Flumazenil Daughter is at the bedside and states that her baseline is confused and agitated, drinks a 6 pack daily, last drink  likely evening of 6/29 -do not currently think patient is in withdrawals but is agitated to the point of impeding care likely from baseline untreated psychiatric disorder and acute illness -admit to ICU for precedex -Check EtOH level, UDS -Seizure precautions -Supplement thiamine, folate, multivitamin    Essential HTN HLD Atrial fibrillation -non-compliant with all home medications, no current prescriptions -Continuous telemetry, anti-hypertensives as needed    Type 2 diabetes Medication reconciliation review reveals no maintenance medication for diabetic control Most recent hemoglobin A1c 09/19/2022 was 7.3 -SSI, CBG goal 140-180,  CBGs every 4 hours  Chronic pain Medication reconciliation pending but it appears patient utilizes both OxyContin and oxycodone with addition of Robaxin at baseline -Hold home opioids currently due to poor mentation -Resume when appropriate  Schizophrenia Anxiety Home medication includes Seroquel, not taking -on precedex currently   Best Practice (right click and "Reselect all SmartList Selections" daily)   Diet/type: Regular consistency (see orders) DVT prophylaxis: LMWH GI prophylaxis: N/A Lines: N/A Foley:  N/A Code Status:  full code Last date of multidisciplinary goals of care discussion [pending, daughter [name] updated at the bedside, is healthcare POA ]  Labs   CBC: Recent Labs  Lab 11/20/22 1053 11/20/22 1104  WBC 9.7  --   HGB 10.3* 12.9  HCT 35.5* 38.0  MCV 77.7*  --   PLT 238  --     Basic Metabolic Panel: Recent Labs  Lab 11/20/22 1053 11/20/22 1104  NA 133* 134*  K 3.9 4.0  CL 95*  --   CO2 25  --   GLUCOSE 262*  --   BUN <5*  --   CREATININE 0.53  --   CALCIUM 8.2*  --    GFR: Estimated Creatinine Clearance: 60.6 mL/min (by C-G formula based on SCr of 0.53 mg/dL). Recent Labs  Lab 11/20/22 1053  WBC 9.7    Liver Function Tests: No results for input(s): "AST", "ALT", "ALKPHOS", "BILITOT", "PROT",  "ALBUMIN" in the last 168 hours. No results for input(s): "LIPASE", "AMYLASE" in the last 168 hours. No results for input(s): "AMMONIA" in the last 168 hours.  ABG    Component Value Date/Time   PHART 7.229 (L) 06/16/2022 1259   PCO2ART 37.2 06/16/2022 1259   PO2ART 71 (L) 06/16/2022 1259   HCO3 28.4 (H) 11/20/2022 1104   TCO2 30 11/20/2022 1104   ACIDBASEDEF 11.0 (H) 06/16/2022 1259   O2SAT 86 11/20/2022 1104     Coagulation Profile: No results for input(s): "INR", "PROTIME" in the last 168 hours.  Cardiac Enzymes: No results for input(s): "CKTOTAL", "CKMB", "CKMBINDEX", "TROPONINI" in the last 168 hours.  HbA1C: Hgb A1c MFr Bld  Date/Time Value Ref Range Status  09/19/2022 02:18 AM 7.3 (H) 4.8 - 5.6 % Final    Comment:    (NOTE)         Prediabetes: 5.7 - 6.4         Diabetes: >6.4         Glycemic control for adults with diabetes: <7.0   06/18/2022 01:22 AM 6.1 (H) 4.8 - 5.6 % Final    Comment:    (NOTE)         Prediabetes: 5.7 - 6.4         Diabetes: >6.4  Glycemic control for adults with diabetes: <7.0     CBG: No results for input(s): "GLUCAP" in the last 168 hours.  Review of Systems:   Unable to obtain  Past Medical History:  She,  has a past medical history of Abdominal pain, Accidental drug overdose (April 2013), Anxiety, Atrial fibrillation (HCC) (09/29/11), Chronic back pain, Chronic knee pain, Chronic nausea, Chronic pain, COPD (chronic obstructive pulmonary disease) (HCC), Depression, Diabetes mellitus, Diabetic neuropathy (HCC), Dyspnea, GERD (gastroesophageal reflux disease), Headache(784.0), HTN (hypertension), Hyperlipidemia, Hypothyroidism, Mental disorder, Requires supplemental oxygen, Schizophrenia (HCC), Schizophrenia, acute (HCC) (11/13/2017), and Tobacco abuse.   Surgical History:   Past Surgical History:  Procedure Laterality Date   ABDOMINAL HYSTERECTOMY     BLADDER SUSPENSION  03/04/2011   Procedure: [facility] PROCEDURE;  Surgeon:  Jonna Coup MacDiarmid;  Location: [facility] ORS;  Service: Urology;  Laterality: N/A;   BOWEL RESECTION N/A 04/18/2022   Procedure: SMALL BOWEL RESECTION;  Surgeon: Harriette Bouillon, MD;  Location: [facility] OR;  Service: General;  Laterality: N/A;   CYSTOCELE REPAIR  03/04/2011   Procedure: ANTERIOR REPAIR (CYSTOCELE);  Surgeon: Martina Sinner;  Location: [facility];  Service: Urology;  Laterality: N/A;   CYSTOSCOPY  03/04/2011   Procedure: CYSTOSCOPY;  Surgeon: Jonna Coup MacDiarmid;  Location: [facility];  Service: Urology;  Laterality: N/A;   ESOPHAGOGASTRODUODENOSCOPY (EGD) WITH PROPOFOL N/A 05/12/2017   Procedure: ESOPHAGOGASTRODUODENOSCOPY (EGD) WITH PROPOFOL;  Surgeon: Ovidio Kin, MD;  Location: [facility];  Service: General;  Laterality: N/A;   GASTRIC ROUX-EN-Y N/A 03/25/2016   Procedure: LAPAROSCOPIC ROUX-EN-Y GASTRIC BYPASS WITH UPPER ENDOSCOPY;  Surgeon: Glenna Fellows, MD;  Location: [facility];  Service: General;  Laterality: N/A;   KNEE SURGERY     LAPAROSCOPIC ASSISTED VAGINAL HYSTERECTOMY  03/04/2011   Procedure: LAPAROSCOPIC ASSISTED VAGINAL HYSTERECTOMY;  Surgeon: Jeani Hawking, MD;  Location: [facility];  Service: Gynecology;  Laterality: N/A;   LAPAROTOMY N/A 04/18/2022   Procedure: EXPLORATORY LAPAROTOMY;  Surgeon: Harriette Bouillon, MD;  Location: [facility] OR;  Service: General;  Laterality: N/A;   LAPAROTOMY N/A 04/24/2022   Procedure: BRING BACK EXPLORATORY LAPAROTOMY;  Surgeon: Violeta Gelinas, MD;  Location: [facility] OR;  Service: General;  Laterality: N/A;   TOTAL HIP ARTHROPLASTY Right 08/27/2022   Procedure: TOTAL HIP ARTHROPLASTY;  Surgeon: Joen , MD;  Location: [facility] OR;  Service: Orthopedics;  Laterality: Right;     Social History:   reports that she has been smoking cigarettes. She has been smoking an average of 1 pack per day. She has never used smokeless tobacco. She reports current alcohol use. She reports current drug use. Drugs: Cocaine, Marijuana, and "Crack" cocaine.   Family  History:  Her family history includes COPD in an other family member; Diabetes in her mother; Heart attack in her father and sister; Heart disease in her mother; Hypertension in her mother. There is no history of Breast cancer.   Allergies Allergies  Allergen Reactions   Iron Dextran Shortness Of Breath and Anxiety   Nsaids Other (See Comments)    H/o gastric bypass - can cause ulcers   Aspirin Nausea And Vomiting and Other (See Comments)    Ok to take tylenol or ibuprofen      Home Medications  Prior to Admission medications   Medication Sig Start Date End Date Taking? Authorizing Provider  acetaminophen (TYLENOL) 500 MG tablet Take 2 tablets (1,000 mg total) by mouth every 8 (eight) hours as needed. Patient not taking: Reported on 09/21/2022 09/10/22   Maczis,  Elmer Sow, PA-C  albuterol (VENTOLIN HFA) 108 (90 Base) MCG/ACT inhaler Inhale 1-2 puffs into the lungs every 4 (four) hours as needed for wheezing or shortness of breath. 11/19/22   [provider]  docusate sodium (COLACE) 100 MG capsule Take 1 capsule (100 mg total) by mouth 2 (two) times daily as needed for mild constipation. Patient not taking: Reported on 09/21/2022 09/10/22   Maczis, Elmer Sow, PA-C  enoxaparin (LOVENOX) 40 MG/0.4ML injection Inject 0.4 mLs (40 mg total) into the skin daily for 14 days. Patient not taking: Reported on 09/21/2022 09/11/22 09/25/22  Maczis, Elmer Sow, PA-C  folic acid (FOLVITE) 1 MG tablet Take 1 tablet (1 mg total) by mouth daily. Patient not taking: Reported on 09/21/2022 08/26/22   Standley Brooking, MD  meloxicam (MOBIC) 7.5 MG tablet Take 7.5 mg by mouth 2 (two) times daily as needed for pain. 09/25/22   [provider]  methocarbamol (ROBAXIN) 500 MG tablet Take 2 tablets (1,000 mg total) by mouth every 8 (eight) hours as needed for muscle spasms. Patient not taking: Reported on 09/21/2022 09/10/22   Jacinto Halim, PA-C  Multiple Vitamin (MULTIVITAMIN WITH MINERALS) TABS tablet  Take 1 tablet by mouth daily. Patient not taking: Reported on 07/05/2022 06/21/22 07/21/22  Berton Mount I, MD  nicotine (NICODERM CQ - DOSED IN MG/24 HOURS) 21 mg/24hr patch Place 1 patch (21 mg total) onto the skin daily. Patient not taking: Reported on 08/25/2022 07/11/22   Lewie Chamber, MD  oxyCODONE (OXYCONTIN) 15 mg 12 hr tablet Take 1 tablet (15 mg total) by mouth every 12 (twelve) hours as needed. Patient not taking: Reported on 09/21/2022 09/10/22   Jacinto Halim, PA-C  oxyCODONE 10 MG TABS Take 0.5-1 tablets (5-10 mg total) by mouth every 6 (six) hours as needed for breakthrough pain. Patient not taking: Reported on 09/21/2022 09/10/22   Maczis, Elmer Sow, PA-C  polyethylene glycol (MIRALAX / GLYCOLAX) 17 g packet Take 17 g by mouth daily as needed. Patient not taking: Reported on 09/21/2022 09/10/22   Jacinto Halim, PA-C  QUEtiapine (SEROQUEL XR) 200 MG 24 hr tablet Take 1 tablet (200 mg total) by mouth at bedtime. Patient not taking: Reported on 09/21/2022 09/10/22   Maczis, Elmer Sow, PA-C  senna-docusate (SENOKOT-S) 8.6-50 MG tablet Take 1 tablet by mouth at bedtime as needed for mild constipation. Patient not taking: Reported on 08/25/2022 07/10/22   Lewie Chamber, MD  thiamine (VITAMIN B-1) 100 MG tablet Take 1 tablet (100 mg total) by mouth daily. Patient not taking: Reported on 09/21/2022 08/26/22   Standley Brooking, MD  zonisamide (ZONEGRAN) 100 MG capsule Take 2 capsules (200 mg total) by mouth at bedtime. Patient not taking: Reported on 09/21/2022 09/10/22   Jacinto Halim, PA-C     Critical care time:  45 minutes      CRITICAL CARE Performed by: Darcella Gasman Kishaun Erekson   Total critical care time: 45 minutes  Critical care time was exclusive of separately billable procedures and treating other patients.  Critical care was necessary to treat or prevent imminent or life-threatening deterioration.  Critical care was time spent personally by me on the following activities:  development of treatment plan with patient and/or surrogate as well as nursing, discussions with consultants, evaluation of patient's response to treatment, examination of patient, obtaining history from patient or surrogate, ordering and performing treatments and interventions, ordering and review of laboratory studies, ordering and review of radiographic studies, pulse oximetry and re-evaluation  of patient's condition.   Darcella Gasman Cherron Blitzer, PA-C [facility] See Amion for pager If no response to pager , please call [phone] until 7pm After 7:00 pm call Elink  [phone]

## 2022-11-20 NOTE — ED Notes (Signed)
Thought order was for 0.5mg  wasted 1.5 mg so pt is receiving 0.5 mg at this time and will pull another 0.5 mg and admin shortly

## 2022-11-20 NOTE — ED Triage Notes (Addendum)
Pt arrived via [facility] from home for c/o [id number]. Pt is supposed to wear 2-3L 02 per Ehrhardt at home, but pt is non-compliant with wearing the 02 or taking home meds. Pt is tachypneic and clammy. Pt states last ETOH was either yesterday or the day before yesterday

## 2022-11-20 NOTE — ED Notes (Signed)
Patient transported to CT 3L o2

## 2022-11-20 NOTE — Progress Notes (Signed)
Per Dr. Chipper Herb hold off on BiPAP at this time.

## 2022-11-20 NOTE — ED Notes (Signed)
RT at bedside for ABG and bipap

## 2022-11-20 NOTE — ED Provider Notes (Signed)
[place] EMERGENCY DEPARTMENT AT [facility] Provider Note  CSN: [id number] Arrival date & time: 11/20/22 7846  Chief Complaint(s) Shortness of Breath  HPI [name] is a 57 y.o. female with history of schizoaffective disorder, A-fib, COPD, diabetes, hypertension, presenting to the emergency department shortness of breath.  Patient reports shortness of breath for around 2 weeks which has been worsening.  She continues to smoke cigarettes.  She has not been taking her home medications.  She reports that she does not usually use her home oxygen either.  She reports some cough with increased sputum production.  Denies chest pain.  No recent travel or surgeries.  No leg swelling.  No abdominal pain.  No fevers or chills.   Past Medical History Past Medical History:  Diagnosis Date   Abdominal pain    Accidental drug overdose April 2013   Anxiety    Atrial fibrillation (HCC) 09/29/11   converted spontaneously   Chronic back pain    Chronic knee pain    Chronic nausea    Chronic pain    COPD (chronic obstructive pulmonary disease) (HCC)    Depression    Diabetes mellitus    states her doctor took her off all DM meds in past month   Diabetic neuropathy (HCC)    Dyspnea    with exertion    GERD (gastroesophageal reflux disease)    Headache(784.0)    migraines    HTN (hypertension)    not on meds since in a year    Hyperlipidemia    Hypothyroidism    not on meds in a while    Mental disorder    Bipolar and schizophrenic   Requires supplemental oxygen    as needed per patient    Schizophrenia (HCC)    Schizophrenia, acute (HCC) 11/13/2017   Tobacco abuse    Patient Active Problem List   Diagnosis Date Noted   Closed fracture of right zygomaticomaxillary complex (HCC) 08/26/2022   Blunt trauma 08/26/2022   Alcohol withdrawal (HCC) 08/25/2022   Seizure-like activity (HCC) 08/23/2022   Cocaine abuse (HCC) 08/23/2022   Closed fracture of left proximal humerus  07/08/2022   Homicidal ideation 07/07/2022   Aggressive behavior 07/05/2022   Hyperbilirubinemia 06/16/2022   Pressure injury of skin 06/16/2022   Pulmonary embolism (HCC) 05/19/2022   Postoperative wound infection 05/04/2022   Thrombocytosis 05/04/2022   Seizure (HCC) 04/17/2022   Memory loss 04/17/2022   SOB (shortness of breath) 04/05/2022   Abnormal EEG 02/12/2022   Constipation 02/12/2022   Vitamin A deficiency 02/12/2022   Vitamin D deficiency 02/12/2022   Vitamin C deficiency 02/12/2022   Iron deficiency anemia 02/12/2022   GERD (gastroesophageal reflux disease) 02/12/2022   Tobacco abuse 02/12/2022   Protein-calorie malnutrition, severe 01/14/2022   Anemia 11/14/2021   Polysubstance (excluding opioids) dependence, daily use (HCC) 08/04/2018   Substance induced mood disorder (HCC) 08/03/2018   Schizoaffective disorder, bipolar type (HCC) 11/13/2017   Polysubstance abuse (HCC) 11/13/2017   Type 2 diabetes mellitus (HCC) 09/06/2015   Essential hypertension 11/04/2011   COPD (chronic obstructive pulmonary disease) (HCC) 09/29/2011   Home Medication(s) Prior to Admission medications   Medication Sig Start Date End Date Taking? Authorizing Provider  acetaminophen (TYLENOL) 500 MG tablet Take 2 tablets (1,000 mg total) by mouth every 8 (eight) hours as needed. Patient not taking: Reported on 09/21/2022 09/10/22   Jacinto Halim, PA-C  albuterol (VENTOLIN HFA) 108 (90 Base) MCG/ACT inhaler Inhale 1-2 puffs into  the lungs every 4 (four) hours as needed for wheezing or shortness of breath. 11/19/22   [provider]  docusate sodium (COLACE) 100 MG capsule Take 1 capsule (100 mg total) by mouth 2 (two) times daily as needed for mild constipation. Patient not taking: Reported on 09/21/2022 09/10/22   Maczis, Elmer Sow, PA-C  enoxaparin (LOVENOX) 40 MG/0.4ML injection Inject 0.4 mLs (40 mg total) into the skin daily for 14 days. Patient not taking: Reported on 09/21/2022  09/11/22 09/25/22  Maczis, Elmer Sow, PA-C  folic acid (FOLVITE) 1 MG tablet Take 1 tablet (1 mg total) by mouth daily. Patient not taking: Reported on 09/21/2022 08/26/22   Standley Brooking, MD  meloxicam (MOBIC) 7.5 MG tablet Take 7.5 mg by mouth 2 (two) times daily as needed for pain. 09/25/22   [provider]  methocarbamol (ROBAXIN) 500 MG tablet Take 2 tablets (1,000 mg total) by mouth every 8 (eight) hours as needed for muscle spasms. Patient not taking: Reported on 09/21/2022 09/10/22   Jacinto Halim, PA-C  Multiple Vitamin (MULTIVITAMIN WITH MINERALS) TABS tablet Take 1 tablet by mouth daily. Patient not taking: Reported on 07/05/2022 06/21/22 07/21/22  Berton Mount I, MD  nicotine (NICODERM CQ - DOSED IN MG/24 HOURS) 21 mg/24hr patch Place 1 patch (21 mg total) onto the skin daily. Patient not taking: Reported on 08/25/2022 07/11/22   Lewie Chamber, MD  oxyCODONE (OXYCONTIN) 15 mg 12 hr tablet Take 1 tablet (15 mg total) by mouth every 12 (twelve) hours as needed. Patient not taking: Reported on 09/21/2022 09/10/22   Jacinto Halim, PA-C  oxyCODONE 10 MG TABS Take 0.5-1 tablets (5-10 mg total) by mouth every 6 (six) hours as needed for breakthrough pain. Patient not taking: Reported on 09/21/2022 09/10/22   Maczis, Elmer Sow, PA-C  polyethylene glycol (MIRALAX / GLYCOLAX) 17 g packet Take 17 g by mouth daily as needed. Patient not taking: Reported on 09/21/2022 09/10/22   Jacinto Halim, PA-C  QUEtiapine (SEROQUEL XR) 200 MG 24 hr tablet Take 1 tablet (200 mg total) by mouth at bedtime. Patient not taking: Reported on 09/21/2022 09/10/22   Maczis, Elmer Sow, PA-C  senna-docusate (SENOKOT-S) 8.6-50 MG tablet Take 1 tablet by mouth at bedtime as needed for mild constipation. Patient not taking: Reported on 08/25/2022 07/10/22   Lewie Chamber, MD  thiamine (VITAMIN B-1) 100 MG tablet Take 1 tablet (100 mg total) by mouth daily. Patient not taking: Reported on 09/21/2022 08/26/22    Standley Brooking, MD  zonisamide (ZONEGRAN) 100 MG capsule Take 2 capsules (200 mg total) by mouth at bedtime. Patient not taking: Reported on 09/21/2022 09/10/22   Princella Pellegrini                                                                                                                                    Past Surgical History Past Surgical History:  Procedure Laterality Date   ABDOMINAL HYSTERECTOMY     BLADDER SUSPENSION  03/04/2011   Procedure: [facility] PROCEDURE;  Surgeon: Jonna Coup MacDiarmid;  Location: [facility] ORS;  Service: Urology;  Laterality: N/A;   BOWEL RESECTION N/A 04/18/2022   Procedure: SMALL BOWEL RESECTION;  Surgeon: Harriette Bouillon, MD;  Location: [facility] OR;  Service: General;  Laterality: N/A;   CYSTOCELE REPAIR  03/04/2011   Procedure: ANTERIOR REPAIR (CYSTOCELE);  Surgeon: Martina Sinner;  Location: [facility] ORS;  Service: Urology;  Laterality: N/A;   CYSTOSCOPY  03/04/2011   Procedure: CYSTOSCOPY;  Surgeon: Jonna Coup MacDiarmid;  Location: [facility];  Service: Urology;  Laterality: N/A;   ESOPHAGOGASTRODUODENOSCOPY (EGD) WITH PROPOFOL N/A 05/12/2017   Procedure: ESOPHAGOGASTRODUODENOSCOPY (EGD) WITH PROPOFOL;  Surgeon: Ovidio Kin, MD;  Location: [facility];  Service: General;  Laterality: N/A;   GASTRIC ROUX-EN-Y N/A 03/25/2016   Procedure: LAPAROSCOPIC ROUX-EN-Y GASTRIC BYPASS WITH UPPER ENDOSCOPY;  Surgeon: Glenna Fellows, MD;  Location: [facility]S;  Service: General;  Laterality: N/A;   KNEE SURGERY     LAPAROSCOPIC ASSISTED VAGINAL HYSTERECTOMY  03/04/2011   Procedure: LAPAROSCOPIC ASSISTED VAGINAL HYSTERECTOMY;  Surgeon: Jeani Hawking, MD;  Location: [facility];  Service: Gynecology;  Laterality: N/A;   LAPAROTOMY N/A 04/18/2022   Procedure: EXPLORATORY LAPAROTOMY;  Surgeon: Harriette Bouillon, MD;  Location: [facility] OR;  Service: General;  Laterality: N/A;   LAPAROTOMY N/A 04/24/2022   Procedure: BRING BACK EXPLORATORY LAPAROTOMY;  Surgeon: Violeta Gelinas, MD;  Location: [facility] OR;   Service: General;  Laterality: N/A;   TOTAL HIP ARTHROPLASTY Right 08/27/2022   Procedure: TOTAL HIP ARTHROPLASTY;  Surgeon: Joen Laura, MD;  Location: [facility] OR;  Service: Orthopedics;  Laterality: Right;   Family History Family History  Problem Relation Age of Onset   Heart attack Father        72s   Diabetes Mother    Heart disease Mother    Hypertension Mother    Heart attack Sister        23   COPD Other    Breast cancer Neg Hx     Social History Social History   Tobacco Use   Smoking status: Every Day    Packs/day: 1    Types: Cigarettes   Smokeless tobacco: Never  Vaping Use   Vaping Use: Never used  Substance Use Topics   Alcohol use: Yes    Comment: 40 ounces daily   Drug use: Yes    Types: Cocaine, Marijuana, "Crack" cocaine    Comment: last use was about a week ago   Allergies Iron dextran, Nsaids, and Aspirin  Review of Systems Review of Systems  All other systems reviewed and are negative.   Physical Exam Vital Signs  I have reviewed the triage vital signs BP (!) 144/70   Pulse (!) 104   Temp 98.9 F (37.2 C) (Oral)   Resp (!) 23   Ht 5\' 2"  (1.575 m)   Wt 53.5 kg   LMP 01/08/2011   SpO2 98%   BMI 21.57 kg/m  Physical Exam Vitals and nursing note reviewed.  Constitutional:      General: She is not in acute distress.    Appearance: She is well-developed.  HENT:     Head: Normocephalic and atraumatic.     Mouth/Throat:     Mouth: Mucous membranes are moist.  Eyes:     Pupils: Pupils are equal, round, and reactive to light.  Neck:     Vascular: No JVD.  Cardiovascular:     Rate and Rhythm: Normal rate and regular rhythm.     Heart sounds: No murmur heard. Pulmonary:     Effort: Pulmonary effort is normal. No respiratory distress.     Breath sounds: Examination of the right-upper field reveals wheezing. Examination of the left-upper field reveals wheezing. Examination of the right-middle field reveals wheezing. Examination of the  left-middle field reveals wheezing. Examination of the right-lower field reveals wheezing. Examination of the left-lower field reveals wheezing. Wheezing present.  Abdominal:     General: Abdomen is flat.     Palpations: Abdomen is soft.     Tenderness: There is no abdominal tenderness.  Musculoskeletal:        General: No tenderness.     Right lower leg: No tenderness. No edema.     Left lower leg: No tenderness. No edema.  Skin:    General: Skin is warm and dry.  Neurological:     General: No focal deficit present.     Mental Status: She is alert. Mental status is at baseline.     Comments: + tremor and tongue fasciculations   Psychiatric:        Mood and Affect: Mood normal.        Behavior: Behavior normal.     ED Results and Treatments Labs (all labs ordered are listed, but only abnormal results are displayed) Labs Reviewed  BASIC METABOLIC PANEL - Abnormal; Notable for the following components:      Result Value   Sodium 133 (*)    Chloride 95 (*)    Glucose, Bld 262 (*)    BUN <5 (*)    Calcium 8.2 (*)    All other components within normal limits  CBC - Abnormal; Notable for the following components:   Hemoglobin 10.3 (*)    HCT 35.5 (*)    MCV 77.7 (*)    MCH 22.5 (*)    MCHC 29.0 (*)    RDW 17.4 (*)    All other components within normal limits  D-DIMER, QUANTITATIVE - Abnormal; Notable for the following components:   D-Dimer, Quant 0.77 (*)    All other components within normal limits  I-STAT VENOUS BLOOD GAS, ED - Abnormal; Notable for the following components:   pH, Ven 7.461 (*)    pCO2, Ven 39.8 (*)    pO2, Ven 49 (*)    Bicarbonate 28.4 (*)    Acid-Base Excess 4.0 (*)    Sodium 134 (*)    Calcium, Ion 0.95 (*)    All other components within normal limits  EXPECTORATED SPUTUM ASSESSMENT W GRAM STAIN, RFLX TO RESP C  BRAIN NATRIURETIC PEPTIDE  TROPONIN I (HIGH SENSITIVITY)  TROPONIN I (HIGH SENSITIVITY)                                                                                                                           Radiology CT Angio Chest PE W and/or Wo Contrast  Result Date: 11/20/2022 CLINICAL DATA:  Two-week history of shortness of breath EXAM: CT ANGIOGRAPHY CHEST WITH CONTRAST TECHNIQUE: Multidetector CT imaging of the chest was performed using the standard protocol during bolus administration of intravenous contrast. Multiplanar CT image reconstructions and MIPs were obtained to evaluate the vascular anatomy. RADIATION DOSE REDUCTION: This exam was performed according to the departmental dose-optimization program which includes automated exposure control, adjustment of the mA and/or kV according to patient size and/or use of iterative reconstruction technique. CONTRAST:  75mL OMNIPAQUE IOHEXOL 350 MG/ML SOLN COMPARISON:  CT chest dated 08/25/2022 FINDINGS: Cardiovascular: The study is high quality for the evaluation of pulmonary embolism. There are no filling defects in the central, lobar, segmental or subsegmental pulmonary artery branches to suggest acute pulmonary embolism. Main pulmonary artery measures 3.5 cm, unchanged. Normal heart size. No significant pericardial fluid/thickening. Coronary artery calcifications and aortic atherosclerosis. Mediastinum/Nodes: Imaged thyroid gland without nodules meeting criteria for imaging follow-up by size. Small hiatal hernia. No pathologically enlarged axillary, supraclavicular, mediastinal, or hilar lymph nodes. Lungs/Pleura: The central airways are patent. Diffuse peribronchial wall thickening with subsegmental mucous plugging in the left lower lobe. Scattered peribronchovascular ground-glass densities throughout. Upper lobe predominant paraseptal and centrilobular emphysema. No pneumothorax. No pleural effusion. Upper abdomen: Postsurgical changes of the stomach. Musculoskeletal: Increased sclerotic appearance of the manubrium. Unchanged compression of T9. Review of the MIP images confirms the  above findings. IMPRESSION: 1. No evidence of pulmonary embolism. 2. Diffuse peribronchial wall thickening with subsegmental mucous plugging in the left lower lobe in keeping with bronchitis. Scattered peribronchovascular ground-glass densities throughout, likely infectious or inflammatory. 3. Unchanged dilation of the main pulmonary artery, which can be seen in the setting of pulmonary arterial hypertension. 4. Increased sclerosis of the manubrium, likely reflecting healing fracture. 5. Aortic Atherosclerosis (ICD10-I70.0) and Emphysema (ICD10-J43.9). Coronary artery calcifications. Assessment for potential risk factor modification, dietary therapy or pharmacologic therapy may be warranted, if clinically indicated. Electronically Signed   By: Agustin Cree M.D.   On: 11/20/2022 13:58   DG Chest Portable 1 View  Result Date: 11/20/2022 CLINICAL DATA:  Shortness of breath EXAM: PORTABLE CHEST 1 VIEW COMPARISON:  X-ray 08/25/2022 FINDINGS: No consolidation, pneumothorax or effusion. No edema. Normal cardiopericardial silhouette. Overlapping cardiac leads. IMPRESSION: No acute cardiopulmonary disease. Electronically Signed   By: Karen Kays M.D.   On: 11/20/2022 10:44    Pertinent labs & imaging results that were available during my care of the patient were reviewed by me and considered in my medical decision making (see MDM for details).  Medications Ordered in ED Medications  LORazepam (ATIVAN) tablet 1-4 mg ( Oral See Alternative 11/20/22 1444)    Or  LORazepam (ATIVAN) injection 1-4 mg (2 mg Intravenous Not Given 11/20/22 1444)  thiamine (VITAMIN B1) tablet 100 mg (has no administration in time range)    Or  thiamine (VITAMIN B1) injection 100 mg (has no administration in time range)  folic acid (FOLVITE) tablet 1 mg (has no administration in time range)  multivitamin with minerals tablet 1 tablet (has no administration in time range)  enoxaparin (LOVENOX) injection 40 mg (has no administration in time  range)  acetaminophen (TYLENOL) tablet 650 mg (has no administration in time range)    Or  acetaminophen (TYLENOL) suppository 650 mg (has no administration in time range)  doxycycline (VIBRA-TABS) tablet 100 mg (has no administration in time range)  budesonide (PULMICORT) nebulizer solution 2 mg (has no administration in time range)  predniSONE (DELTASONE) tablet 40 mg (has  no administration in time range)  mometasone-formoterol (DULERA) 200-5 MCG/ACT inhaler 2 puff (has no administration in time range)  ipratropium-albuterol (DUONEB) 0.5-2.5 (3) MG/3ML nebulizer solution 3 mL (has no administration in time range)  albuterol (PROVENTIL) (2.5 MG/3ML) 0.083% nebulizer solution 2.5 mg (has no administration in time range)  guaiFENesin (MUCINEX) 12 hr tablet 1,200 mg (has no administration in time range)  nicotine (NICODERM CQ - dosed in mg/24 hours) patch 21 mg (has no administration in time range)  ipratropium-albuterol (DUONEB) 0.5-2.5 (3) MG/3ML nebulizer solution 3 mL (3 mLs Nebulization Given 11/20/22 1048)  methylPREDNISolone sodium succinate (SOLU-MEDROL) 125 mg/2 mL injection 125 mg (125 mg Intravenous Given 11/20/22 1047)  sodium chloride 0.9 % bolus 1,000 mL (0 mLs Intravenous Stopped 11/20/22 1229)  LORazepam (ATIVAN) injection 1 mg (1 mg Intravenous Given 11/20/22 1141)  LORazepam (ATIVAN) injection 1 mg (0.5 mg Intravenous Given 11/20/22 1300)  ipratropium-albuterol (DUONEB) 0.5-2.5 (3) MG/3ML nebulizer solution 3 mL (3 mLs Nebulization Given 11/20/22 1301)  cefTRIAXone (ROCEPHIN) 2 g in sodium chloride 0.9 % 100 mL IVPB (0 g Intravenous Stopped 11/20/22 1435)  azithromycin (ZITHROMAX) 500 mg in sodium chloride 0.9 % 250 mL IVPB (0 mg Intravenous Stopped 11/20/22 1435)  iohexol (OMNIPAQUE) 350 MG/ML injection 75 mL (75 mLs Intravenous Contrast Given 11/20/22 1333)                                                                                                                                      Procedures .Critical Care  Performed by: Lonell Grandchild, MD Authorized by: Lonell Grandchild, MD   Critical care provider statement:    Critical care time (minutes):  30   Critical care was necessary to treat or prevent imminent or life-threatening deterioration of the following conditions:  Respiratory failure (alcohol withdrawal)   Critical care was time spent personally by me on the following activities:  Development of treatment plan with patient or surrogate, discussions with consultants, evaluation of patient's response to treatment, examination of patient, ordering and review of laboratory studies, ordering and review of radiographic studies, ordering and performing treatments and interventions, pulse oximetry, re-evaluation of patient's condition and review of old charts   Care discussed with: admitting provider     (including critical care time)  Medical Decision Making / ED Course   MDM:  57 year old female presenting to the emergency department with shortness of breath.  On physical examination, patient wheezing diffusely.  No focal pulmonary sounds.  She is tachypneic and mildly tachycardic.  She has a history of schizophrenia but is not floridly psychotic and answering questions appropriately. Does have signs of alcohol withdrawal with mild tremor  Suspect COPD exacerbation given wheezing, increased sputum production.  Patient not hypoxic on her home oxygen.  Will check VBG.  Less likely pneumonia given increased sputum production will obtain x-ray to evaluate for focal infiltrate.  Will also evaluate for pneumothorax.  Less  likely pulmonary embolism patient has history and has not been taking her medications, no clinical findings of DVT on exam, no chest pain, will check D-dimer.  No signs of volume overload to suggest CHF exacerbation.  Check labs to evaluate for toxic or metabolic causes of her shortness of breath.  Will reassess.  Clinical Course as of 11/20/22 1501   Thu Nov 20, 2022  1210 Patient having alcohol withdrawal signs, endorses daily alcohol use. Also with positive D-dimer. Still with dyspnea, tachypnea. Will give additional duoneb and 2nd dose of ativan. CTA ordered. Antibiotics ordered given increased sputum production. Will likely require admission pending remainder of labs [WS]  1305 Discussed with Dr. Chipper Herb who will admit [WS]  1458 Patient continuing to have signs of alcohol withdrawal so started on CIWA [WS]    Clinical Course User Index [WS] Lonell Grandchild, MD     Additional history obtained: -Additional history obtained from ems -External records from outside source obtained and reviewed including: Chart review including previous notes, labs, imaging, consultation notes including previous ER visit for psychosis   Lab Tests: -I ordered, reviewed, and interpreted labs.   The pertinent results include:   Labs Reviewed  BASIC METABOLIC PANEL - Abnormal; Notable for the following components:      Result Value   Sodium 133 (*)    Chloride 95 (*)    Glucose, Bld 262 (*)    BUN <5 (*)    Calcium 8.2 (*)    All other components within normal limits  CBC - Abnormal; Notable for the following components:   Hemoglobin 10.3 (*)    HCT 35.5 (*)    MCV 77.7 (*)    MCH 22.5 (*)    MCHC 29.0 (*)    RDW 17.4 (*)    All other components within normal limits  D-DIMER, QUANTITATIVE - Abnormal; Notable for the following components:   D-Dimer, Quant 0.77 (*)    All other components within normal limits  I-STAT VENOUS BLOOD GAS, ED - Abnormal; Notable for the following components:   pH, Ven 7.461 (*)    pCO2, Ven 39.8 (*)    pO2, Ven 49 (*)    Bicarbonate 28.4 (*)    Acid-Base Excess 4.0 (*)    Sodium 134 (*)    Calcium, Ion 0.95 (*)    All other components within normal limits  EXPECTORATED SPUTUM ASSESSMENT W GRAM STAIN, RFLX TO RESP C  BRAIN NATRIURETIC PEPTIDE  TROPONIN I (HIGH SENSITIVITY)  TROPONIN I (HIGH SENSITIVITY)     Notable for normal troponin, positive D-dimer  EKG   EKG Interpretation  Date/Time:  Thursday Nov 20 2022 10:06:47 EDT Ventricular Rate:  110 PR Interval:  125 QRS Duration: 87 QT Interval:  347 QTC Calculation: 470 R Axis:   55 Text Interpretation: Sinus tachycardia Probable left atrial enlargement Confirmed by Alvino Blood ([id number]) on 11/20/2022 10:44:16 AM         Imaging Studies ordered: I ordered imaging studies including CTA chest On my interpretation imaging demonstrates bronchitis I independently visualized and interpreted imaging. I agree with the radiologist interpretation   Medicines ordered and prescription drug management: Meds ordered this encounter  Medications   ipratropium-albuterol (DUONEB) 0.5-2.5 (3) MG/3ML nebulizer solution 3 mL   methylPREDNISolone sodium succinate (SOLU-MEDROL) 125 mg/2 mL injection 125 mg    IV methylprednisolone will be converted to either a q12h or q24h frequency with the same total daily dose (TDD).  Ordered Dose: 1 to 125 mg  TDD; convert to: TDD q24h.  Ordered Dose: 126 to 250 mg TDD; convert to: TDD div q12h.  Ordered Dose: >250 mg TDD; DAW.   sodium chloride 0.9 % bolus 1,000 mL   LORazepam (ATIVAN) injection 1 mg   LORazepam (ATIVAN) injection 1 mg   ipratropium-albuterol (DUONEB) 0.5-2.5 (3) MG/3ML nebulizer solution 3 mL   cefTRIAXone (ROCEPHIN) 2 g in sodium chloride 0.9 % 100 mL IVPB    Order Specific Question:   Antibiotic Indication:    Answer:   CAP    Order Specific Question:   Other Indication:    Answer:   copd exacer   azithromycin (ZITHROMAX) 500 mg in sodium chloride 0.9 % 250 mL IVPB   iohexol (OMNIPAQUE) 350 MG/ML injection 75 mL   OR Linked Order Group    LORazepam (ATIVAN) tablet 1-4 mg     Order Specific Question:   CIWA-AR < 5 =     Answer:   0 mg     Order Specific Question:   CIWA-AR 5 -10 =     Answer:   1 mg     Order Specific Question:   CIWA-AR 11 -15 =     Answer:   2 mg     Order  Specific Question:   CIWA-AR 16 -20 =     Answer:   3 mg     Order Specific Question:   CIWA-AR 16 -20 =     Answer:   Recheck CIWA-AR in 1 hour; if > 20 notify MD     Order Specific Question:   CIWA-AR > 20 =     Answer:   4 mg     Order Specific Question:   CIWA-AR > 20 =     Answer:   Call Rapid Response    LORazepam (ATIVAN) injection 1-4 mg     Order Specific Question:   CIWA-AR < 5 =     Answer:   0 mg     Order Specific Question:   CIWA-AR 5 -10 =     Answer:   1 mg     Order Specific Question:   CIWA-AR 11 -15 =     Answer:   2 mg     Order Specific Question:   CIWA-AR 16 -20 =     Answer:   3 mg     Order Specific Question:   CIWA-AR 16 -20 =     Answer:   Recheck CIWA-AR in 1 hour; if > 20 notify MD     Order Specific Question:   CIWA-AR > 20 =     Answer:   4 mg     Order Specific Question:   CIWA-AR > 20 =     Answer:   Call Rapid Response   OR Linked Order Group    thiamine (VITAMIN B1) tablet 100 mg    thiamine (VITAMIN B1) injection 100 mg   folic acid (FOLVITE) tablet 1 mg   multivitamin with minerals tablet 1 tablet   LORazepam (ATIVAN) 2 MG/ML injection    [place], [place] M: cabinet override   DISCONTD: albuterol (VENTOLIN HFA) 108 (90 Base) MCG/ACT inhaler 1-2 puff   enoxaparin (LOVENOX) injection 40 mg   OR Linked Order Group    acetaminophen (TYLENOL) tablet 650 mg    acetaminophen (TYLENOL) suppository 650 mg   doxycycline (VIBRA-TABS) tablet 100 mg   budesonide (PULMICORT) nebulizer solution 2 mg   predniSONE (DELTASONE) tablet 40 mg  mometasone-formoterol (DULERA) 200-5 MCG/ACT inhaler 2 puff   ipratropium-albuterol (DUONEB) 0.5-2.5 (3) MG/3ML nebulizer solution 3 mL   albuterol (PROVENTIL) (2.5 MG/3ML) 0.083% nebulizer solution 2.5 mg   guaiFENesin (MUCINEX) 12 hr tablet 1,200 mg   nicotine (NICODERM CQ - dosed in mg/24 hours) patch 21 mg    -I have reviewed the patients home medicines and have made adjustments as needed   Consultations  Obtained: I requested consultation with the hospitalist,  and discussed lab and imaging findings as well as pertinent plan - they recommend: admission   Cardiac Monitoring: The patient was maintained on a cardiac monitor.  I personally viewed and interpreted the cardiac monitored which showed an underlying rhythm of: sinus tachycardia   Social Determinants of Health:  Diagnosis or treatment significantly limited by social determinants of health: alcohol use   Reevaluation: After the interventions noted above, I reevaluated the patient and found that their symptoms have improved  Co morbidities that complicate the patient evaluation  Past Medical History:  Diagnosis Date   Abdominal pain    Accidental drug overdose April 2013   Anxiety    Atrial fibrillation (HCC) 09/29/11   converted spontaneously   Chronic back pain    Chronic knee pain    Chronic nausea    Chronic pain    COPD (chronic obstructive pulmonary disease) (HCC)    Depression    Diabetes mellitus    states her doctor took her off all DM meds in past month   Diabetic neuropathy (HCC)    Dyspnea    with exertion    GERD (gastroesophageal reflux disease)    Headache(784.0)    migraines    HTN (hypertension)    not on meds since in a year    Hyperlipidemia    Hypothyroidism    not on meds in a while    Mental disorder    Bipolar and schizophrenic   Requires supplemental oxygen    as needed per patient    Schizophrenia (HCC)    Schizophrenia, acute (HCC) 11/13/2017   Tobacco abuse       Dispostion: Disposition decision including need for hospitalization was considered, and patient admitted to the hospital.    Final Clinical Impression(s) / ED Diagnoses Final diagnoses:  COPD exacerbation (HCC)  Alcohol withdrawal syndrome without complication (HCC)     This chart was dictated using voice recognition software.  Despite best efforts to proofread,  errors can occur which can change the documentation  meaning.    Lonell Grandchild, MD 11/20/22 1501

## 2022-11-20 NOTE — ED Notes (Addendum)
Pt is sweating and coughing and when calling pt she is not opening her eyes or responding to sternal rub or any stimulation.  Pt pupils are pinpoint and not reacting MD at bedside at this time

## 2022-11-20 NOTE — ED Notes (Signed)
Slip socks placed and pt assisted back into the bed sandwich provided

## 2022-11-20 NOTE — Progress Notes (Signed)
eLink Physician-Brief Progress Note Patient Name: [name] DOB: 1966-04-09 MRN: [id number]   Date of Service  11/20/2022  HPI/Events of Note  57 year old diabetic female who states has been taken off all diabetes medications by her primary care physician was admitted for shortness of breath.  She received a dose of steroids and is now hyperglycemic.  Capillary blood glucose is 318.  Bedside RN requesting change in frequency of sliding scale insulin for better glycemic control.  eICU Interventions  Since her steroids have been discontinued, anticipate her blood glucose will get better.  For now, no long-acting insulin will be added.  Will change the frequency of her sliding scale to every 4 hours to achieve an improvement in glycemic control.     Intervention Category Intermediate Interventions: Hyperglycemia - evaluation and treatment  Carilyn Goodpasture 11/20/2022, 9:45 PM

## 2022-11-20 NOTE — ED Notes (Signed)
2 [place] sandwich's provided ok with MD

## 2022-11-21 ENCOUNTER — Other Ambulatory Visit (HOSPITAL_COMMUNITY): Payer: Self-pay

## 2022-11-21 DIAGNOSIS — F10930 Alcohol use, unspecified with withdrawal, uncomplicated: Secondary | ICD-10-CM | POA: Diagnosis not present

## 2022-11-21 LAB — MAGNESIUM: Magnesium: 2.2 mg/dL (ref 1.7–2.4)

## 2022-11-21 LAB — COMPREHENSIVE METABOLIC PANEL
ALT: 16 U/L (ref 0–44)
AST: 18 U/L (ref 15–41)
Albumin: 2.8 g/dL — ABNORMAL LOW (ref 3.5–5.0)
Alkaline Phosphatase: 129 U/L — ABNORMAL HIGH (ref 38–126)
Anion gap: 8 (ref 5–15)
BUN: <5 mg/dL — ABNORMAL LOW (ref 6–20)
CO2: 31 mmol/L (ref 22–32)
Calcium: 8.4 mg/dL — ABNORMAL LOW (ref 8.9–10.3)
Chloride: 97 mmol/L — ABNORMAL LOW (ref 98–111)
Creatinine, Ser: 0.37 mg/dL — ABNORMAL LOW (ref 0.44–1.00)
GFR, Estimated: >60 mL/min (ref >60)
Glucose, Bld: 155 mg/dL — ABNORMAL HIGH (ref 70–99)
Potassium: 3.7 mmol/L (ref 3.5–5.1)
Sodium: 136 mmol/L (ref 135–145)
Total Bilirubin: 0.6 mg/dL (ref 0.3–1.2)
Total Protein: 6.5 g/dL (ref 6.5–8.1)

## 2022-11-21 LAB — CBC WITH DIFFERENTIAL/PLATELET
Abs Immature Granulocytes: 0.02 10*3/uL (ref 0.00–0.07)
Basophils Absolute: 0 10*3/uL (ref 0.0–0.1)
Basophils Relative: 0 %
Eosinophils Absolute: 0 10*3/uL (ref 0.0–0.5)
Eosinophils Relative: 0 %
HCT: 37.7 % (ref 36.0–46.0)
Hemoglobin: 11 g/dL — ABNORMAL LOW (ref 12.0–15.0)
Immature Granulocytes: 0 %
Lymphocytes Relative: 22 %
Lymphs Abs: 1.2 10*3/uL (ref 0.7–4.0)
MCH: 22.4 pg — ABNORMAL LOW (ref 26.0–34.0)
MCHC: 29.2 g/dL — ABNORMAL LOW (ref 30.0–36.0)
MCV: 76.9 fL — ABNORMAL LOW (ref 80.0–100.0)
Monocytes Absolute: 0.6 10*3/uL (ref 0.1–1.0)
Monocytes Relative: 12 %
Neutro Abs: 3.4 10*3/uL (ref 1.7–7.7)
Neutrophils Relative %: 66 %
Platelets: 206 10*3/uL (ref 150–400)
RBC: 4.9 MIL/uL (ref 3.87–5.11)
RDW: 17.5 % — ABNORMAL HIGH (ref 11.5–15.5)
WBC: 5.2 10*3/uL (ref 4.0–10.5)
nRBC: 0 % (ref 0.0–0.2)

## 2022-11-21 LAB — GLUCOSE, CAPILLARY
Glucose-Capillary: 206 mg/dL — ABNORMAL HIGH (ref 70–99)
Glucose-Capillary: 55 mg/dL — ABNORMAL LOW (ref 70–99)
Glucose-Capillary: 139 mg/dL — ABNORMAL HIGH (ref 70–99)
Glucose-Capillary: 143 mg/dL — ABNORMAL HIGH (ref 70–99)
Glucose-Capillary: 173 mg/dL — ABNORMAL HIGH (ref 70–99)
Glucose-Capillary: 186 mg/dL — ABNORMAL HIGH (ref 70–99)
Glucose-Capillary: 140 mg/dL — ABNORMAL HIGH (ref 70–99)
Glucose-Capillary: 184 mg/dL — ABNORMAL HIGH (ref 70–99)
Glucose-Capillary: 79 mg/dL (ref 70–99)

## 2022-11-21 LAB — VITAMIN B12: Vitamin B-12: 381 pg/mL (ref 180–914)

## 2022-11-21 LAB — VITAMIN D 25 HYDROXY (VIT D DEFICIENCY, FRACTURES): Vit D, 25-Hydroxy: 7.95 ng/mL — ABNORMAL LOW (ref 30–100)

## 2022-11-21 LAB — C-REACTIVE PROTEIN: CRP: 1 mg/dL — ABNORMAL HIGH (ref <1.0)

## 2022-11-21 LAB — PHOSPHORUS: Phosphorus: 3.2 mg/dL (ref 2.5–4.6)

## 2022-11-21 MED ORDER — DEXTROSE 50 % IV SOLN: 1.0000 | Freq: Once | INTRAVENOUS | Status: DC

## 2022-11-21 MED ORDER — CHLORDIAZEPOXIDE HCL 5 MG PO CAPS
25.0000 mg | ORAL_CAPSULE | Freq: Every day | ORAL | Status: AC
  Administered 2022-11-24: 25 mg via ORAL
  Filled 2022-11-21: qty 5

## 2022-11-21 MED ORDER — AZITHROMYCIN 500 MG PO TABS
500.0000 mg | ORAL_TABLET | Freq: Every day | ORAL | Status: AC
  Administered 2022-11-22 – 2022-11-23 (×2): 500 mg via ORAL
  Filled 2022-11-21 (×2): qty 1

## 2022-11-21 MED ORDER — LOPERAMIDE HCL 2 MG PO CAPS: 2.0000 mg | ORAL_CAPSULE | ORAL | Status: AC | PRN

## 2022-11-21 MED ORDER — LORAZEPAM 2 MG/ML IJ SOLN
INTRAMUSCULAR | Status: AC
  Filled 2022-11-21: qty 1

## 2022-11-21 MED ORDER — INSULIN GLARGINE-YFGN 100 UNIT/ML ~~LOC~~ SOLN
8.0000 [IU] | Freq: Every day | SUBCUTANEOUS | Status: DC
  Administered 2022-11-21 – 2022-11-27 (×7): 8 [IU] via SUBCUTANEOUS
  Filled 2022-11-21 (×8): qty 0.08

## 2022-11-21 MED ORDER — DEXTROSE 50 % IV SOLN
12.5000 g | INTRAVENOUS | Status: AC
  Administered 2022-11-21: 12.5 g via INTRAVENOUS

## 2022-11-21 MED ORDER — DEXTROSE 50 % IV SOLN
INTRAVENOUS | Status: AC
  Filled 2022-11-21: qty 50

## 2022-11-21 MED ORDER — CHLORDIAZEPOXIDE HCL 25 MG PO CAPS
50.0000 mg | ORAL_CAPSULE | Freq: Once | ORAL | Status: DC
  Filled 2022-11-21: qty 10

## 2022-11-21 MED ORDER — CHLORDIAZEPOXIDE HCL 5 MG PO CAPS
25.0000 mg | ORAL_CAPSULE | Freq: Four times a day (QID) | ORAL | Status: AC | PRN
  Administered 2022-11-23: 25 mg via ORAL
  Filled 2022-11-21 (×2): qty 5

## 2022-11-21 MED ORDER — CHLORHEXIDINE GLUCONATE CLOTH 2 % EX PADS
6.0000 | MEDICATED_PAD | Freq: Every day | CUTANEOUS | Status: DC
  Administered 2022-11-21 – 2022-11-30 (×9): 6 via TOPICAL

## 2022-11-21 MED ORDER — CHLORDIAZEPOXIDE HCL 5 MG PO CAPS
25.0000 mg | ORAL_CAPSULE | Freq: Three times a day (TID) | ORAL | Status: AC
  Administered 2022-11-22 – 2022-11-23 (×3): 25 mg via ORAL
  Filled 2022-11-21 (×3): qty 5

## 2022-11-21 MED ORDER — ONDANSETRON HCL 4 MG/2ML IJ SOLN
INTRAMUSCULAR | Status: AC
  Administered 2022-11-21: 4 mg
  Filled 2022-11-21: qty 2

## 2022-11-21 MED ORDER — HALOPERIDOL LACTATE 5 MG/ML IJ SOLN
5.0000 mg | Freq: Four times a day (QID) | INTRAMUSCULAR | Status: DC | PRN
  Administered 2022-11-21 – 2022-11-27 (×9): 5 mg via INTRAVENOUS
  Filled 2022-11-21 (×9): qty 1

## 2022-11-21 MED ORDER — OLANZAPINE 5 MG PO TBDP
10.0000 mg | ORAL_TABLET | Freq: Every day | ORAL | Status: DC
  Administered 2022-11-21 – 2022-11-30 (×9): 10 mg via ORAL
  Filled 2022-11-21 (×10): qty 2

## 2022-11-21 MED ORDER — CHLORDIAZEPOXIDE HCL 5 MG PO CAPS
25.0000 mg | ORAL_CAPSULE | Freq: Four times a day (QID) | ORAL | Status: AC
  Administered 2022-11-21 – 2022-11-22 (×3): 25 mg via ORAL
  Filled 2022-11-21 (×4): qty 5

## 2022-11-21 MED ORDER — ONDANSETRON 4 MG PO TBDP
4.0000 mg | ORAL_TABLET | Freq: Four times a day (QID) | ORAL | Status: AC | PRN
  Administered 2022-11-22 – 2022-11-23 (×3): 4 mg via ORAL
  Filled 2022-11-21 (×4): qty 1

## 2022-11-21 MED ORDER — ADULT MULTIVITAMIN W/MINERALS CH
1.0000 | ORAL_TABLET | Freq: Two times a day (BID) | ORAL | Status: DC
  Administered 2022-11-21 – 2022-12-01 (×20): 1 via ORAL
  Filled 2022-11-21 (×21): qty 1

## 2022-11-21 MED ORDER — ENSURE ENLIVE PO LIQD
237.0000 mL | Freq: Three times a day (TID) | ORAL | Status: DC
  Administered 2022-11-21 – 2022-11-28 (×9): 237 mL via ORAL

## 2022-11-21 MED ORDER — CHLORDIAZEPOXIDE HCL 5 MG PO CAPS
25.0000 mg | ORAL_CAPSULE | ORAL | Status: AC
  Administered 2022-11-23 – 2022-11-24 (×2): 25 mg via ORAL
  Filled 2022-11-21 (×2): qty 5

## 2022-11-21 MED ORDER — POTASSIUM CHLORIDE 10 MEQ/100ML IV SOLN
10.0000 meq | INTRAVENOUS | Status: AC
  Administered 2022-11-21 (×4): 10 meq via INTRAVENOUS
  Filled 2022-11-21 (×4): qty 100

## 2022-11-21 MED ORDER — INSULIN ASPART 100 UNIT/ML IJ SOLN
0.0000 [IU] | INTRAMUSCULAR | Status: DC
  Administered 2022-11-21: 1 [IU] via SUBCUTANEOUS
  Administered 2022-11-21 – 2022-11-22 (×2): 2 [IU] via SUBCUTANEOUS
  Administered 2022-11-22: 1 [IU] via SUBCUTANEOUS
  Administered 2022-11-22 – 2022-11-23 (×3): 2 [IU] via SUBCUTANEOUS
  Administered 2022-11-23: 1 [IU] via SUBCUTANEOUS
  Administered 2022-11-23 – 2022-11-24 (×2): 3 [IU] via SUBCUTANEOUS
  Administered 2022-11-24: 1 [IU] via SUBCUTANEOUS
  Administered 2022-11-24: 2 [IU] via SUBCUTANEOUS
  Administered 2022-11-24: 3 [IU] via SUBCUTANEOUS
  Administered 2022-11-24: 1 [IU] via SUBCUTANEOUS
  Administered 2022-11-25: 5 [IU] via SUBCUTANEOUS

## 2022-11-21 MED ORDER — LORAZEPAM 2 MG/ML IJ SOLN
1.0000 mg | INTRAMUSCULAR | Status: DC | PRN
  Administered 2022-11-21 (×3): 2 mg via INTRAVENOUS
  Filled 2022-11-21 (×2): qty 1

## 2022-11-21 MED ORDER — DIAZEPAM 5 MG/ML IJ SOLN
5.0000 mg | Freq: Once | INTRAMUSCULAR | Status: AC
  Administered 2022-11-21: 5 mg via INTRAVENOUS
  Filled 2022-11-21: qty 2

## 2022-11-21 MED ORDER — HYDROXYZINE HCL 25 MG PO TABS
25.0000 mg | ORAL_TABLET | Freq: Four times a day (QID) | ORAL | Status: AC | PRN
  Administered 2022-11-21 – 2022-11-23 (×2): 25 mg via ORAL
  Filled 2022-11-21 (×3): qty 1

## 2022-11-21 MED ORDER — CALCIUM CARBONATE ANTACID 500 MG PO CHEW
1.0000 | CHEWABLE_TABLET | Freq: Three times a day (TID) | ORAL | Status: DC
  Administered 2022-11-21 – 2022-12-01 (×26): 200 mg via ORAL
  Filled 2022-11-21 (×27): qty 1

## 2022-11-21 MED ORDER — LORAZEPAM 2 MG/ML IJ SOLN
2.0000 mg | INTRAMUSCULAR | Status: DC | PRN
  Administered 2022-11-22 – 2022-11-23 (×2): 2 mg via INTRAVENOUS
  Administered 2022-11-23 – 2022-11-24 (×5): 4 mg via INTRAVENOUS
  Administered 2022-11-24: 2 mg via INTRAVENOUS
  Administered 2022-11-24 – 2022-11-26 (×8): 4 mg via INTRAVENOUS
  Filled 2022-11-21 (×2): qty 2
  Filled 2022-11-21: qty 1
  Filled 2022-11-21 (×4): qty 2
  Filled 2022-11-21: qty 1
  Filled 2022-11-21: qty 2
  Filled 2022-11-21: qty 1
  Filled 2022-11-21 (×2): qty 2
  Filled 2022-11-21: qty 1
  Filled 2022-11-21: qty 2
  Filled 2022-11-21: qty 1
  Filled 2022-11-21: qty 2
  Filled 2022-11-21 (×2): qty 1
  Filled 2022-11-21 (×2): qty 2
  Filled 2022-11-21: qty 1
  Filled 2022-11-21: qty 2
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
## 2022-11-21 NOTE — TOC Benefit Eligibility Note (Signed)
Patient Advocate Encounter  Insurance verification completed.    The patient is currently admitted and upon discharge could be taking Farxiga 10 mg.  The current 30 day co-pay is $0.00.   The patient is currently admitted and upon discharge could be taking Jardiance 10 mg.  The current 30 day co-pay is $0.00.   The patient is insured through [employer] Medicare Part D   This test claim was processed through [facility]- copay amounts may vary at other pharmacies due to pharmacy/plan contracts, or as the patient moves through the different stages of their insurance plan.  Merick Kelleher, CPHT Pharmacy Patient Advocate Specialist [facility] Patient Advocate Team Direct Number: [phone]  Fax: [phone]       

## 2022-11-21 NOTE — Progress Notes (Signed)
Patient became agitated and wanting to leave around 1530.  Her precedex gtt was discontinued and cut off at 1330. Myself and patients daughter tried to redirect her but she then tried to physically walk out of room. Ativan given per CIWA protocol. The patient became confused and not stable on her feet. HR up into 130s. I made Dr. Tonia Brooms aware and he ordered haldol and valium for withdrawal symptoms. Pt. Given haldol. She was still trying to leave with myself and nurse tech trying to get her to sit down and ask her what she needed to be more comfortable.  She stated she just wanted to get a beer and get out of here. She was trying to take all her monitoring equipment off. She didn't remember why she came to the hospital in the first place.  Daughter had left by this point but I did call and update her. She would like for patient to be involuntarily committee since she is confused and not able to make good decisions for herself.  Patient then was given valium.  We got patient to agree to get back in bed. She is now resting and HR is coming down to 110s and O2 is 100% on non-re breather.

## 2022-11-21 NOTE — Progress Notes (Signed)
[facility] ADULT ICU REPLACEMENT PROTOCOL   The patient does apply for the [facility] Adult ICU Electrolyte Replacment Protocol based on the criteria listed below:   1.Exclusion criteria: TCTS, ECMO, Dialysis, and Myasthenia Gravis patients 2. Is GFR >/= 30 ml/min? Yes.    Patient's GFR today is >60 3. Is SCr </= 2? Yes.   Patient's SCr is 0.37 mg/dL 4. Did SCr increase >/= 0.5 in 24 hours? No. 5.Pt's weight >40kg  Yes.   6. Abnormal electrolyte(s): Potassium 3.7  7. Electrolytes replaced per protocol 8.  Call MD STAT for K+ </= 2.5, Phos </= 1, or Mag </= 1 Physician:  Dr. Agapito Games A Parissa Chiao 11/21/2022 5:32 AM
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
## 2022-11-21 NOTE — Progress Notes (Signed)
CSW received consult for patient for substance use resources. CSW following to speak with patient when appropriate. CSW will continue to follow. 

## 2022-11-21 NOTE — Progress Notes (Signed)
PCCM:  I called and spoke with the patients daughter.  The patient is very upset and wanting to leave AMA.  I will increase some of her PRN meds  Valium 5mg  IV X1 PRN Haldol ordered  We will try to continue to redirect her.  I suspect she has a component of withdrawal symptoms she is experiencing.  Josephine Igo, DO [facility] 11/21/2022 5:02 PM
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
## 2022-11-21 NOTE — Inpatient Diabetes Management (Signed)
Inpatient Diabetes Program Recommendations  AACE/ADA: New Consensus Statement on Inpatient Glycemic Control (2015)  Target Ranges:  Prepandial:   less than 140 mg/dL      Peak postprandial:   less than 180 mg/dL (1-2 hours)      Critically ill patients:  140 - 180 mg/dL   Lab Results  Component Value Date   GLUCAP 186 (H) 11/21/2022   HGBA1C 7.3 (H) 09/19/2022    Review of Glycemic Control  Latest Reference Range & Units 11/21/22 03:21 11/21/22 04:27 11/21/22 05:54 11/21/22 07:45  Glucose-Capillary 70 - 99 mg/dL 213 (H) 086 (H) 578 (H) 186 (H)  (H): Data is abnormally high Diabetes history: Type 2 DM Outpatient Diabetes medications: none Current orders for Inpatient glycemic control: Novolog 0-15 units Q4H Solumedrol 125 mg x 1  Inpatient Diabetes Program Recommendations:    Noted hypoglycemic event following Novolog 15 units. Administration was >2.5 hours following previous CBG.  In effort to reduce hypoglycemia correction must be given within one hour of previous CBG.   At this time, consider: -Adding Semglee 8 units QD -Reducing correction to Novolog 0-9 units Q4H -Carb modified diet if appropriate?  Thanks, Lujean Rave, MSN, RNC-OB Diabetes Coordinator [phone] (8a-5p)

## 2022-11-22 DIAGNOSIS — E440 Moderate protein-calorie malnutrition: Secondary | ICD-10-CM | POA: Insufficient documentation

## 2022-11-22 DIAGNOSIS — F10930 Alcohol use, unspecified with withdrawal, uncomplicated: Secondary | ICD-10-CM | POA: Diagnosis not present

## 2022-11-22 LAB — BASIC METABOLIC PANEL
Anion gap: 8 (ref 5–15)
BUN: 12 mg/dL (ref 6–20)
CO2: 28 mmol/L (ref 22–32)
Calcium: 8 mg/dL — ABNORMAL LOW (ref 8.9–10.3)
Chloride: 102 mmol/L (ref 98–111)
Creatinine, Ser: 0.49 mg/dL (ref 0.44–1.00)
GFR, Estimated: >60 mL/min (ref >60)
Glucose, Bld: 105 mg/dL — ABNORMAL HIGH (ref 70–99)
Potassium: 4.5 mmol/L (ref 3.5–5.1)
Sodium: 138 mmol/L (ref 135–145)

## 2022-11-22 LAB — GLUCOSE, CAPILLARY
Glucose-Capillary: 107 mg/dL — ABNORMAL HIGH (ref 70–99)
Glucose-Capillary: 130 mg/dL — ABNORMAL HIGH (ref 70–99)
Glucose-Capillary: 148 mg/dL — ABNORMAL HIGH (ref 70–99)
Glucose-Capillary: 160 mg/dL — ABNORMAL HIGH (ref 70–99)
Glucose-Capillary: 169 mg/dL — ABNORMAL HIGH (ref 70–99)
Glucose-Capillary: 177 mg/dL — ABNORMAL HIGH (ref 70–99)
Glucose-Capillary: 95 mg/dL (ref 70–99)

## 2022-11-22 MED ORDER — METOPROLOL TARTRATE 5 MG/5ML IV SOLN
2.5000 mg | Freq: Four times a day (QID) | INTRAVENOUS | Status: DC
  Administered 2022-11-22 – 2022-11-23 (×4): 2.5 mg via INTRAVENOUS
  Filled 2022-11-22 (×3): qty 5

## 2022-11-22 MED ORDER — BUDESONIDE 0.5 MG/2ML IN SUSP
0.5000 mg | Freq: Two times a day (BID) | RESPIRATORY_TRACT | Status: DC
  Administered 2022-11-22 – 2022-12-01 (×16): 0.5 mg via RESPIRATORY_TRACT
  Filled 2022-11-22 (×18): qty 2

## 2022-11-22 NOTE — Progress Notes (Signed)
   11/22/22 1800  Unmeasured Output  Emesis Occurrence 1  Emesis Characteristics  Emesis Appearance Other (Comment) (food)   Pt vomited and was choking on food they were eating. Pt was able to cough up their food, airway maintained. Pt educated on slowing eating and chewing well vs fast eating All needs met
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
## 2022-11-23 DIAGNOSIS — F10930 Alcohol use, unspecified with withdrawal, uncomplicated: Secondary | ICD-10-CM | POA: Diagnosis not present

## 2022-11-23 LAB — GLUCOSE, CAPILLARY
Glucose-Capillary: 87 mg/dL (ref 70–99)
Glucose-Capillary: 222 mg/dL — ABNORMAL HIGH (ref 70–99)
Glucose-Capillary: 120 mg/dL — ABNORMAL HIGH (ref 70–99)
Glucose-Capillary: 141 mg/dL — ABNORMAL HIGH (ref 70–99)
Glucose-Capillary: 82 mg/dL (ref 70–99)
Glucose-Capillary: 158 mg/dL — ABNORMAL HIGH (ref 70–99)

## 2022-11-23 MED ORDER — DIAZEPAM 5 MG/ML IJ SOLN
5.0000 mg | Freq: Four times a day (QID) | INTRAMUSCULAR | Status: DC | PRN
  Administered 2022-11-23: 5 mg via INTRAVENOUS
  Filled 2022-11-23: qty 2

## 2022-11-23 MED ORDER — METOPROLOL TARTRATE 25 MG PO TABS: 25.0000 mg | ORAL_TABLET | Freq: Two times a day (BID) | ORAL | Status: DC

## 2022-11-23 MED ORDER — METOPROLOL TARTRATE 25 MG PO TABS
25.0000 mg | ORAL_TABLET | Freq: Two times a day (BID) | ORAL | Status: DC | PRN
  Administered 2022-11-23: 25 mg via ORAL
  Filled 2022-11-23 (×2): qty 1
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
## 2022-11-23 NOTE — Consult Note (Signed)
Patient seen but unable to participate in full psychiatric evaluation to determine capacity due to being sedated. She is arousable but confused, lethargic and disoriented due to being on librium alcohol detox protocol. Case discussed with hospitalist,  Dr. Melina Schools.  Recommendations/Plan: -Continue alcohol withdrawal protocol -Continue Olanzapine 10 mg at bedtime for agitation -Consider switching Olanzapine  to Invega tablet in future, which can be easily switched to [place] monthly injectable to mitigate patient medication poor compliance -Psychiatric service will attempt to re-assess patient tomorrow-11/24/22  Thedore Mins, MD Attending psychiatrist

## 2022-11-23 NOTE — Evaluation (Signed)
Physical Therapy Evaluation Patient Details Name: [name] MRN: [id number] DOB: [birth year] Today's Date: 11/23/2022  History of Present Illness  [name] is a 57 y.o. female admitted 5/30 with SOB, AMS, COPD and ETOH withdrawal.  Drinks daily and cocaine postive on admit.  Postive Flu.   PMH significant for DM2, HTN, HLD, A-fib, COPD, schizophrenia, bipolar, anxiety, depression, chronic back pain, neuropathy, hypothyroidism polysubstance abuse (alcohol, tobacco, cocaine) and noncompliant to medications, oxygen or follow-ups.  Clinical Impression  Pt admitted with above diagnosis. Pt was able to stand and pivot to 3N1 and then to recliner with min assist and cues for safety as pt with impulsivity and poor safety awareness. Pt would benefit from post acute Rehab > 3 hours therapy a day.  Will follow acutely.  Pt currently with functional limitations due to the deficits listed below (see PT Problem List). Pt will benefit from acute skilled PT to increase their independence and safety with mobility to allow discharge.          Recommendations for follow up therapy are one component of a multi-disciplinary discharge planning process, led by the attending physician.  Recommendations may be updated based on patient status, additional functional criteria and insurance authorization.  Follow Up Recommendations       Assistance Recommended at Discharge Intermittent Supervision/Assistance  Patient can return home with the following  A little help with walking and/or transfers;A little help with bathing/dressing/bathroom;Assistance with cooking/housework;Assist for transportation;Help with stairs or ramp for entrance    Equipment Recommendations None recommended by PT  Recommendations for Other Services  Rehab consult    Functional Status Assessment Patient has had a recent decline in their functional status and demonstrates the ability to make significant improvements in function in a  reasonable and predictable amount of time.     Precautions / Restrictions Precautions Precautions: Fall Precaution Comments: DRoplet Restrictions Weight Bearing Restrictions: No      Mobility  Bed Mobility Overal bed mobility: Independent                  Transfers Overall transfer level: Needs assistance Equipment used: Rolling walker (2 wheels) Transfers: Sit to/from Stand, Bed to chair/wheelchair/BSC Sit to Stand: Min assist   Step pivot transfers: Min assist       General transfer comment: Pt needed steadying assist and cues for safety foor sit to stand and was able to step pivot to 3N1 and then to chair with min assist with Rw.    Ambulation/Gait                  Stairs            Wheelchair Mobility    Modified Rankin (Stroke Patients Only)       Balance Overall balance assessment: Needs assistance Sitting-balance support: No upper extremity supported, Feet supported Sitting balance-Leahy Scale: Fair     Standing balance support: Bilateral upper extremity supported, During functional activity, Reliant on assistive device for balance                                 Pertinent Vitals/Pain Pain Assessment Pain Assessment: No/denies pain    Home Living Family/patient expects to be discharged to:: Private residence Living Arrangements: Other relatives (brother sttays on other end of house) Available Help at Discharge: Family;Available 24 hours/day (3 kids can help) Type of Home: House Home Access: Stairs to enter Entrance Stairs-Rails:  Can reach both Entrance Stairs-Number of Steps: 5   Home Layout: One level Home Equipment: Agricultural consultant (2 wheels);Cane - single point;Shower seat;Cane - quad Additional Comments: O2 at home however smokes so she states she doesnt use it.    Prior Function Prior Level of Function : Independent/Modified Independent             Mobility Comments: Using RW since dc from [facility] ADLs  Comments: Per chart: noncompliant with meds; does her own finances; cooks ad lib.     Hand Dominance   Dominant Hand: Right    Extremity/Trunk Assessment   Upper Extremity Assessment Upper Extremity Assessment: Defer to OT evaluation    Lower Extremity Assessment Lower Extremity Assessment: Generalized weakness    Cervical / Trunk Assessment Cervical / Trunk Assessment: Normal  Communication   Communication: No difficulties  Cognition Arousal/Alertness: Awake/alert Behavior During Therapy: Flat affect Overall Cognitive Status: Within Functional Limits for tasks assessed                                          General Comments General comments (skin integrity, edema, etc.): HR to 135 bpm while standing, post VS 119 bpm, 94% 4L, 105/61    Exercises     Assessment/Plan    PT Assessment Patient needs continued PT services  PT Problem List Decreased strength;Decreased activity tolerance;Decreased balance;Decreased mobility;Decreased knowledge of use of DME;Decreased safety awareness;Cardiopulmonary status limiting activity       PT Treatment Interventions Functional mobility training;Gait training;DME instruction;Therapeutic exercise;Therapeutic activities;Stair training;Balance training;Patient/family education    PT Goals (Current goals can be found in the Care Plan section)  Acute Rehab PT Goals Patient Stated Goal: to go home PT Goal Formulation: With patient Time For Goal Achievement: 12/07/22 Potential to Achieve Goals: Good    Frequency Min 3X/week     Co-evaluation               AM-PAC PT "6 Clicks" Mobility  Outcome Measure Help needed turning from your back to your side while in a flat bed without using bedrails?: None Help needed moving from lying on your back to sitting on the side of a flat bed without using bedrails?: None Help needed moving to and from a bed to a chair (including a wheelchair)?: A Little Help needed  standing up from a chair using your arms (e.g., wheelchair or bedside chair)?: A Little Help needed to walk in hospital room?: Total Help needed climbing 3-5 steps with a railing? : Total 6 Click Score: 16    End of Session Equipment Utilized During Treatment: Gait belt;Oxygen Activity Tolerance: Patient limited by fatigue Patient left: in chair;with call bell/phone within reach;with chair alarm set Nurse Communication: Mobility status PT Visit Diagnosis: Muscle weakness (generalized) (M62.81)    Time: 1478-2956 PT Time Calculation (min) (ACUTE ONLY): 20 min   Charges:   PT Evaluation $PT Eval Moderate Complexity: 1 Mod        Yaffa Seckman M,PT Acute Rehab Services [phone]   Bevelyn Buckles 11/23/2022, 9:07 PM

## 2022-11-23 NOTE — Progress Notes (Signed)
I have had no involvement with this patient. The order may have been entered by triage protocol under my name.

## 2022-11-24 ENCOUNTER — Inpatient Hospital Stay (HOSPITAL_COMMUNITY): Payer: 59

## 2022-11-24 DIAGNOSIS — F10931 Alcohol use, unspecified with withdrawal delirium: Secondary | ICD-10-CM | POA: Diagnosis not present

## 2022-11-24 DIAGNOSIS — G9340 Encephalopathy, unspecified: Secondary | ICD-10-CM | POA: Diagnosis not present

## 2022-11-24 DIAGNOSIS — F10930 Alcohol use, unspecified with withdrawal, uncomplicated: Secondary | ICD-10-CM | POA: Diagnosis not present

## 2022-11-24 LAB — CBC
HCT: 40.7 % (ref 36.0–46.0)
Hemoglobin: 11.5 g/dL — ABNORMAL LOW (ref 12.0–15.0)
MCH: 22.2 pg — ABNORMAL LOW (ref 26.0–34.0)
MCHC: 28.3 g/dL — ABNORMAL LOW (ref 30.0–36.0)
MCV: 78.6 fL — ABNORMAL LOW (ref 80.0–100.0)
Platelets: 279 10*3/uL (ref 150–400)
RBC: 5.18 MIL/uL — ABNORMAL HIGH (ref 3.87–5.11)
RDW: 18 % — ABNORMAL HIGH (ref 11.5–15.5)
WBC: 10 10*3/uL (ref 4.0–10.5)
nRBC: 0 % (ref 0.0–0.2)

## 2022-11-24 LAB — GLUCOSE, CAPILLARY
Glucose-Capillary: 135 mg/dL — ABNORMAL HIGH (ref 70–99)
Glucose-Capillary: 85 mg/dL (ref 70–99)
Glucose-Capillary: 173 mg/dL — ABNORMAL HIGH (ref 70–99)
Glucose-Capillary: 240 mg/dL — ABNORMAL HIGH (ref 70–99)
Glucose-Capillary: 202 mg/dL — ABNORMAL HIGH (ref 70–99)
Glucose-Capillary: 131 mg/dL — ABNORMAL HIGH (ref 70–99)

## 2022-11-24 LAB — BASIC METABOLIC PANEL
Anion gap: 11 (ref 5–15)
BUN: 9 mg/dL (ref 6–20)
CO2: 33 mmol/L — ABNORMAL HIGH (ref 22–32)
Calcium: 8.7 mg/dL — ABNORMAL LOW (ref 8.9–10.3)
Chloride: 96 mmol/L — ABNORMAL LOW (ref 98–111)
Creatinine, Ser: 0.36 mg/dL — ABNORMAL LOW (ref 0.44–1.00)
GFR, Estimated: >60 mL/min (ref >60)
Glucose, Bld: 112 mg/dL — ABNORMAL HIGH (ref 70–99)
Potassium: 4.7 mmol/L (ref 3.5–5.1)
Sodium: 140 mmol/L (ref 135–145)

## 2022-11-24 LAB — PROCALCITONIN: Procalcitonin: <0.1 ng/mL

## 2022-11-24 MED ORDER — METOPROLOL TARTRATE 5 MG/5ML IV SOLN
5.0000 mg | Freq: Once | INTRAVENOUS | Status: AC
  Administered 2022-11-24: 5 mg via INTRAVENOUS
  Filled 2022-11-24: qty 5

## 2022-11-24 MED ORDER — METOPROLOL TARTRATE 25 MG PO TABS
25.0000 mg | ORAL_TABLET | Freq: Two times a day (BID) | ORAL | Status: DC
  Administered 2022-11-24 – 2022-12-01 (×11): 25 mg via ORAL
  Filled 2022-11-24 (×13): qty 1

## 2022-11-24 MED ORDER — THIAMINE HCL 100 MG/ML IJ SOLN
500.0000 mg | INTRAVENOUS | Status: DC
  Administered 2022-11-24 – 2022-11-25 (×2): 500 mg via INTRAVENOUS
  Filled 2022-11-24 (×4): qty 5
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
## 2022-11-24 NOTE — Progress Notes (Signed)
Inpatient Rehab Admissions Coordinator:   Per PT recommendation, patient was screened for CIR candidacy by Megan Salon, MS, CCC-SLP. At this time, Pt. does not appear to  have a diagnosis that I believe payor will approve for CIR. I will not pursue a rehab consult for this Pt.   Recommend other rehab venues to be pursued.  Please contact me with any questions.  Megan Salon, MS, CCC-SLP Rehab Admissions Coordinator  [phone] (celll) [phone] (office)

## 2022-11-25 ENCOUNTER — Inpatient Hospital Stay (HOSPITAL_COMMUNITY): Payer: 59

## 2022-11-25 ENCOUNTER — Encounter (HOSPITAL_COMMUNITY): Payer: Self-pay | Admitting: Orthopedic Surgery

## 2022-11-25 DIAGNOSIS — F10930 Alcohol use, unspecified with withdrawal, uncomplicated: Secondary | ICD-10-CM | POA: Diagnosis not present

## 2022-11-25 DIAGNOSIS — F10931 Alcohol use, unspecified with withdrawal delirium: Secondary | ICD-10-CM | POA: Diagnosis not present

## 2022-11-25 DIAGNOSIS — G9340 Encephalopathy, unspecified: Secondary | ICD-10-CM | POA: Diagnosis not present

## 2022-11-25 LAB — GLUCOSE, CAPILLARY
Glucose-Capillary: 119 mg/dL — ABNORMAL HIGH (ref 70–99)
Glucose-Capillary: 94 mg/dL (ref 70–99)
Glucose-Capillary: 281 mg/dL — ABNORMAL HIGH (ref 70–99)
Glucose-Capillary: 131 mg/dL — ABNORMAL HIGH (ref 70–99)
Glucose-Capillary: 190 mg/dL — ABNORMAL HIGH (ref 70–99)
Glucose-Capillary: 261 mg/dL — ABNORMAL HIGH (ref 70–99)

## 2022-11-25 LAB — BASIC METABOLIC PANEL
Anion gap: 9 (ref 5–15)
BUN: 8 mg/dL (ref 6–20)
CO2: 31 mmol/L (ref 22–32)
Calcium: 8.5 mg/dL — ABNORMAL LOW (ref 8.9–10.3)
Chloride: 101 mmol/L (ref 98–111)
Creatinine, Ser: 0.35 mg/dL — ABNORMAL LOW (ref 0.44–1.00)
GFR, Estimated: >60 mL/min (ref >60)
Glucose, Bld: 53 mg/dL — ABNORMAL LOW (ref 70–99)
Potassium: 3.6 mmol/L (ref 3.5–5.1)
Sodium: 141 mmol/L (ref 135–145)

## 2022-11-25 LAB — CBC WITH DIFFERENTIAL/PLATELET
Abs Immature Granulocytes: 0.06 10*3/uL (ref 0.00–0.07)
Basophils Absolute: 0 10*3/uL (ref 0.0–0.1)
Basophils Relative: 0 %
Eosinophils Absolute: 0.1 10*3/uL (ref 0.0–0.5)
Eosinophils Relative: 1 %
HCT: 35.3 % — ABNORMAL LOW (ref 36.0–46.0)
Hemoglobin: 10.4 g/dL — ABNORMAL LOW (ref 12.0–15.0)
Immature Granulocytes: 0 %
Lymphocytes Relative: 15 %
Lymphs Abs: 2.1 10*3/uL (ref 0.7–4.0)
MCH: 22.5 pg — ABNORMAL LOW (ref 26.0–34.0)
MCHC: 29.5 g/dL — ABNORMAL LOW (ref 30.0–36.0)
MCV: 76.2 fL — ABNORMAL LOW (ref 80.0–100.0)
Monocytes Absolute: 1 10*3/uL (ref 0.1–1.0)
Monocytes Relative: 8 %
Neutro Abs: 10.3 10*3/uL — ABNORMAL HIGH (ref 1.7–7.7)
Neutrophils Relative %: 76 %
Platelets: 302 10*3/uL (ref 150–400)
RBC: 4.63 MIL/uL (ref 3.87–5.11)
RDW: 17.8 % — ABNORMAL HIGH (ref 11.5–15.5)
WBC: 13.6 10*3/uL — ABNORMAL HIGH (ref 4.0–10.5)
nRBC: 0 % (ref 0.0–0.2)

## 2022-11-25 LAB — VITAMIN A: Vitamin A (Retinoic Acid): 24.8 ug/dL (ref 20.1–62.0)

## 2022-11-25 MED ORDER — INSULIN ASPART 100 UNIT/ML IJ SOLN
0.0000 [IU] | Freq: Every day | INTRAMUSCULAR | Status: DC
  Administered 2022-11-25: 3 [IU] via SUBCUTANEOUS
  Administered 2022-11-28 – 2022-11-30 (×2): 2 [IU] via SUBCUTANEOUS

## 2022-11-25 MED ORDER — VITAMIN D (ERGOCALCIFEROL) 1.25 MG (50000 UNIT) PO CAPS
50000.0000 [IU] | ORAL_CAPSULE | ORAL | Status: DC
  Administered 2022-11-25: 50000 [IU] via ORAL
  Filled 2022-11-25: qty 1

## 2022-11-25 MED ORDER — INSULIN ASPART 100 UNIT/ML IJ SOLN
0.0000 [IU] | Freq: Three times a day (TID) | INTRAMUSCULAR | Status: DC
  Administered 2022-11-25: 1 [IU] via SUBCUTANEOUS
  Administered 2022-11-26: 2 [IU] via SUBCUTANEOUS
  Administered 2022-11-26: 1 [IU] via SUBCUTANEOUS
  Administered 2022-11-27: 2 [IU] via SUBCUTANEOUS
  Administered 2022-11-27 (×2): 5 [IU] via SUBCUTANEOUS
  Administered 2022-11-28 (×2): 2 [IU] via SUBCUTANEOUS
  Administered 2022-11-29: 7 [IU] via SUBCUTANEOUS
  Administered 2022-11-29: 5 [IU] via SUBCUTANEOUS
  Administered 2022-11-30: 3 [IU] via SUBCUTANEOUS
  Administered 2022-11-30: 1 [IU] via SUBCUTANEOUS
  Administered 2022-11-30: 3 [IU] via SUBCUTANEOUS
  Administered 2022-12-01: 5 [IU] via SUBCUTANEOUS

## 2022-11-25 NOTE — TOC Progression Note (Signed)
Transition of Care [facility]) - Progression Note    Patient Details  Name: [name] MRN: [id number] Date of Birth: 1965/08/24  Transition [facility] CM/SW Contact  Graves-Bigelow, Lamar Laundry, RN Phone Number: 11/25/2022, 2:35 PM  Clinical Narrative:  Patient was discussed in progression rounds this morning. Case Manager spoke with patients daughter regarding oxygen. [employer] did service the concentrator to make sure the concentrator was working; tubing and tanks have been exchanged. Case Manager will continue to follow for transition of care needs.   Expected Discharge Plan: Home w Home Health Services Barriers to Discharge: Continued Medical Work up  Expected Discharge Plan and Services In-house Referral: NA Discharge Planning Services: CM Consult   Living arrangements for the past 2 months: Single Family Home    Social Determinants of Health (SDOH) Interventions SDOH Screenings   Food Insecurity: No Food Insecurity (05/06/2022)  Housing: Low Risk  (05/06/2022)  Transportation Needs: No Transportation Needs (05/06/2022)  Utilities: Not At Risk (05/06/2022)  Alcohol Screen: Medium Risk (08/04/2018)  Social Connections: Moderately Isolated (05/06/2022)  Tobacco Use: High Risk (11/25/2022)    Readmission Risk Interventions    11/21/2022    1:16 PM 04/28/2022    2:18 PM 04/07/2022   12:41 PM  Readmission Risk Prevention Plan  Transportation Screening Complete Complete Complete  HRI or Home Care Consult   Complete  Social Work Consult for Recovery Care Planning/Counseling   Complete  Palliative Care Screening   Not Applicable  Medication Review [employer]) Referral to Pharmacy Complete Complete  PCP or Specialist appointment within 3-5 days of discharge  Complete   HRI or Home Care Consult Complete Complete   SW Recovery Care/Counseling Consult  Complete   Palliative Care Screening Not Applicable Not Applicable   Skilled Nursing Facility Not Applicable Not Applicable
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
## 2022-11-25 NOTE — Progress Notes (Signed)
Physical Therapy Treatment Patient Details Name: [name] MRN: [id number] DOB: [birth year] Today's Date: 11/25/2022   History of Present Illness [name] is a 56 y.o. female admitted 5/30 with SOB, AMS, COPD and ETOH withdrawal.  Drinks daily and cocaine postive on admit.  Postive Flu.   PMH significant for DM2, HTN, HLD, A-fib, COPD, schizophrenia, bipolar, anxiety, depression, chronic back pain, neuropathy, hypothyroidism polysubstance abuse (alcohol, tobacco, cocaine) and noncompliant to medications, oxygen or follow-ups.    PT Comments    Pt with flat affect, decreased awareness and orientation able to progress to walking this session. Pt on RA during gait with SPO2 90% and returned to 1L end of session at 92%. Pt remains impulsive and lacks awareness of deficits but anticipate pt will continue to progress with mobility as cognition clears. If family able to provide supervision/assist return home may be feasible vs continued inpatient follow up therapy, <3 hours/day. Will continue to follow for activity progression and safety. Pt deferred further activity due to wanting to eat.      Recommendations for follow up therapy are one component of a multi-disciplinary discharge planning process, led by the attending physician.  Recommendations may be updated based on patient status, additional functional criteria and insurance authorization.  Follow Up Recommendations       Assistance Recommended at Discharge Intermittent Supervision/Assistance  Patient can return home with the following A little help with walking and/or transfers;A little help with bathing/dressing/bathroom;Assistance with cooking/housework;Assist for transportation;Help with stairs or ramp for entrance   Equipment Recommendations  None recommended by PT    Recommendations for Other Services       Precautions / Restrictions Precautions Precautions: Fall Precaution Comments: DRoplet Restrictions Weight  Bearing Restrictions: No     Mobility  Bed Mobility Overal bed mobility: Needs Assistance Bed Mobility: Supine to Sit     Supine to sit: Supervision     General bed mobility comments: HOb 30 degrees, supervision for safety and lines. pt needing cues to wait until lines moved    Transfers Overall transfer level: Needs assistance   Transfers: Sit to/from Stand Sit to Stand: Min guard           General transfer comment: guarding for safety with cues due to impulsivity    Ambulation/Gait Ambulation/Gait assistance: Min assist Gait Distance (Feet): 160 Feet Assistive device: Rolling walker (2 wheels) Gait Pattern/deviations: Step-through pattern, Decreased stride length, Trunk flexed   Gait velocity interpretation: 1.31 - 2.62 ft/sec, indicative of limited community ambulator   General Gait Details: pt impulsive with gait needing min assist to direct Rw and pt periodically running into obstacles despite cues   Stairs             Wheelchair Mobility    Modified Rankin (Stroke Patients Only)       Balance Overall balance assessment: Needs assistance   Sitting balance-Leahy Scale: Fair Sitting balance - Comments: able to sit without UE support   Standing balance support: Bilateral upper extremity supported, During functional activity, Reliant on assistive device for balance Standing balance-Leahy Scale: Fair Standing balance comment: Rw for gait                            Cognition Arousal/Alertness: Awake/alert Behavior During Therapy: Flat affect Overall Cognitive Status: Impaired/Different from baseline Area of Impairment: Orientation, Memory, Safety/judgement                 Orientation  Level: Time, Situation       Safety/Judgement: Decreased awareness of safety, Decreased awareness of deficits     General Comments: pt oriented to year not month or day. Aware she is in the hospital. pt with decreased awareness and memory  throughout. applesauce all over her, unaware. Pt unable to recall or find room number        Exercises      General Comments        Pertinent Vitals/Pain Pain Assessment Pain Assessment: Faces Pain Score: 8  Pain Location: back Pain Descriptors / Indicators: Aching, Constant Pain Intervention(s): Limited activity within patient's tolerance, Repositioned, Monitored during session    Home Living                          Prior Function            PT Goals (current goals can now be found in the care plan section) Progress towards PT goals: Progressing toward goals    Frequency    Min 1X/week      PT Plan Discharge plan needs to be updated    Co-evaluation              AM-PAC PT "6 Clicks" Mobility   Outcome Measure  Help needed turning from your back to your side while in a flat bed without using bedrails?: A Little Help needed moving from lying on your back to sitting on the side of a flat bed without using bedrails?: A Little Help needed moving to and from a bed to a chair (including a wheelchair)?: A Little Help needed standing up from a chair using your arms (e.g., wheelchair or bedside chair)?: A Little Help needed to walk in hospital room?: A Little Help needed climbing 3-5 steps with a railing? : A Lot 6 Click Score: 17    End of Session Equipment Utilized During Treatment: Gait belt Activity Tolerance: Patient tolerated treatment well Patient left: in chair;with call bell/phone within reach;with chair alarm set Nurse Communication: Mobility status PT Visit Diagnosis: Muscle weakness (generalized) (M62.81);Other abnormalities of gait and mobility (R26.89)     Time: 1610-9604 PT Time Calculation (min) (ACUTE ONLY): 21 min  Charges:  $Gait Training: 8-22 mins                     [name], PT Acute Rehabilitation Services Office: [phone]    [name] [name] 11/25/2022, 10:33 AM

## 2022-11-25 NOTE — Evaluation (Signed)
Clinical/Bedside Swallow Evaluation Patient Details  Name: [name] MRN: [id number] Date of Birth: Jan 02, 1966  Today's Date: 11/25/2022 Time: SLP Start Time (ACUTE ONLY): 0859 SLP Stop Time (ACUTE ONLY): 4098 SLP Time Calculation (min) (ACUTE ONLY): 19 min  Past Medical History:  Past Medical History:  Diagnosis Date   Abdominal pain    Accidental drug overdose April 2013   Anxiety    Atrial fibrillation (HCC) 09/29/11   converted spontaneously   Chronic back pain    Chronic knee pain    Chronic nausea    Chronic pain    COPD (chronic obstructive pulmonary disease) (HCC)    Depression    Diabetes mellitus    states her doctor took her off all DM meds in past month   Diabetic neuropathy (HCC)    Dyspnea    with exertion    GERD (gastroesophageal reflux disease)    Headache(784.0)    migraines    HTN (hypertension)    not on meds since in a year    Hyperlipidemia    Hypothyroidism    not on meds in a while    Mental disorder    Bipolar and schizophrenic   Requires supplemental oxygen    as needed per patient    Schizophrenia (HCC)    Schizophrenia, acute (HCC) 11/13/2017   Tobacco abuse    Past Surgical History:  Past Surgical History:  Procedure Laterality Date   ABDOMINAL HYSTERECTOMY     BLADDER SUSPENSION  03/04/2011   Procedure: [facility] PROCEDURE;  Surgeon: Jonna Coup MacDiarmid;  Location: [facility];  Service: Urology;  Laterality: N/A;   BOWEL RESECTION N/A 04/18/2022   Procedure: SMALL BOWEL RESECTION;  Surgeon: Harriette Bouillon, MD;  Location: [facility] OR;  Service: General;  Laterality: N/A;   CYSTOCELE REPAIR  03/04/2011   Procedure: ANTERIOR REPAIR (CYSTOCELE);  Surgeon: Martina Sinner;  Location: [facility];  Service: Urology;  Laterality: N/A;   CYSTOSCOPY  03/04/2011   Procedure: CYSTOSCOPY;  Surgeon: Jonna Coup MacDiarmid;  Location: [facility];  Service: Urology;  Laterality: N/A;   ESOPHAGOGASTRODUODENOSCOPY (EGD) WITH PROPOFOL N/A 05/12/2017   Procedure:  ESOPHAGOGASTRODUODENOSCOPY (EGD) WITH PROPOFOL;  Surgeon: Ovidio Kin, MD;  Location: [facility];  Service: General;  Laterality: N/A;   GASTRIC ROUX-EN-Y N/A 03/25/2016   Procedure: LAPAROSCOPIC ROUX-EN-Y GASTRIC BYPASS WITH UPPER ENDOSCOPY;  Surgeon: Glenna Fellows, MD;  Location: [facility];  Service: General;  Laterality: N/A;   KNEE SURGERY     LAPAROSCOPIC ASSISTED VAGINAL HYSTERECTOMY  03/04/2011   Procedure: LAPAROSCOPIC ASSISTED VAGINAL HYSTERECTOMY;  Surgeon: Jeani Hawking, MD;  Location: [facility];  Service: Gynecology;  Laterality: N/A;   LAPAROTOMY N/A 04/18/2022   Procedure: EXPLORATORY LAPAROTOMY;  Surgeon: Harriette Bouillon, MD;  Location: [facility];  Service: General;  Laterality: N/A;   LAPAROTOMY N/A 04/24/2022   Procedure: BRING BACK EXPLORATORY LAPAROTOMY;  Surgeon: Violeta Gelinas, MD;  Location[facility];  Service: General;  Laterality: N/A;   TOTAL HIP ARTHROPLASTY Right 08/27/2022   Procedure: TOTAL HIP ARTHROPLASTY;  Surgeon: Joen Laura, MD;  Location: [facility] OR;  Service: Orthopedics;  Laterality: Right;   HPI:  [name] is a 57 y.o. female admitted 5/30 with SOB, AMS, COPD and ETOH withdrawal.  Drinks daily and cocaine postive on admit.  Postive Flu.  PMH significant for DM2, HTN, HLD, A-fib, COPD, schizophrenia, bipolar, anxiety, depression, chronic back pain, neuropathy, hypothyroidism polysubstance abuse (alcohol, tobacco, cocaine) and noncompliant to medications, oxygen or follow-ups. Noted to  be coughing after meds. MBS with aspiration of thin 02/16/22 with nectar recommended and Dys 3    Assessment / Plan / Recommendation  Clinical Impression  Pt has a history of dysphagia from hospitalization in 2023 requiring nectar thick liquids. She has been coughing with intake with staff and today with breakfast had intermittent general coughing that was delayed throughout meal. Cough not noted after any particular consistency but congested cough during intake. Suspect she  may have esophageal involvement given history of ETOH. Also obsered with whole meds in applesauce with RN needing intermittent liquid wash to clear mouth. Given history of aspiration and clinical observations an instrumental assessment with MBS is warranted and scheduled for 12:30 today. She can continue regular/thin until that time. SLP Visit Diagnosis: Dysphagia, unspecified (R13.10)    Aspiration Risk  Mild aspiration risk    Diet Recommendation Dysphagia 3 (Mech soft);Thin liquid   Liquid Administration via: Cup;Straw Medication Administration: Whole meds with puree Supervision: Staff to assist with self feeding Compensations: Slow rate;Small sips/bites Postural Changes: Seated upright at 90 degrees    Other  Recommendations Oral Care Recommendations: Oral care BID    Recommendations for follow up therapy are one component of a multi-disciplinary discharge planning process, led by the attending physician.  Recommendations may be updated based on patient status, additional functional criteria and insurance authorization.  Follow up Recommendations  (TBD)      Assistance Recommended at Discharge    Functional Status Assessment    Frequency and Duration            Prognosis        Swallow Study   General Date of Onset: 11/25/22 HPI: [name] is a 57 y.o. female admitted 5/30 with SOB, AMS, COPD and ETOH withdrawal.  Drinks daily and cocaine postive on admit.  Postive Flu.  PMH significant for DM2, HTN, HLD, A-fib, COPD, schizophrenia, bipolar, anxiety, depression, chronic back pain, neuropathy, hypothyroidism polysubstance abuse (alcohol, tobacco, cocaine) and noncompliant to medications, oxygen or follow-ups. Noted to be coughing after meds. MBS with aspiration of thin 02/16/22 with nectar recommended and Dys 3 Type of Study: Bedside Swallow Evaluation Previous Swallow Assessment: see HPI Diet Prior to this Study: Regular;Thin liquids (Level 0) Temperature Spikes Noted:  No Respiratory Status: Nasal cannula History of Recent Intubation: No Behavior/Cognition: Lethargic/Drowsy;Cooperative;Pleasant mood;Requires cueing Oral Cavity Assessment: Within Functional Limits Oral Care Completed by SLP: No Oral Cavity - Dentition: Poor condition;Missing dentition (missing majority upper, poor condition, some lower) Vision: Functional for self-feeding Self-Feeding Abilities: Needs assist Patient Positioning: Upright in bed Baseline Vocal Quality: Normal Volitional Cough: Weak Volitional Swallow: Able to elicit    Oral/Motor/Sensory Function Overall Oral Motor/Sensory Function: Within functional limits   Ice Chips Ice chips: Not tested   Thin Liquid Thin Liquid: Impaired Presentation: Straw;Cup Pharyngeal  Phase Impairments: Cough - Delayed    Nectar Thick Nectar Thick Liquid: Not tested   Honey Thick Honey Thick Liquid: Not tested   Puree Puree: Within functional limits   Solid     Solid: Impaired Pharyngeal Phase Impairments: Cough - Delayed      Roque Cash Breck Coons 11/25/2022,9:40 AM

## 2022-11-25 NOTE — Consult Note (Cosign Needed Addendum)
[facility] Face-to-Face Psychiatry Consult   Reason for Consult:   Aggressive Behavior  Referring Physician:  Oswald Hillock Patient Identification: [name] MRN:  [id number] Principal Diagnosis: Alcohol withdrawal (HCC) Diagnosis:  Principal Problem:   Alcohol withdrawal (HCC) Active Problems:   COPD (chronic obstructive pulmonary disease) (HCC)   Encephalopathy   Acute respiratory failure with hypoxia and hypercapnia (HCC)   Withdrawal symptoms, alcohol (HCC)   Pneumonia   Malnutrition of moderate degree   Total Time spent with patient: 30 minutes  Subjective:   [name] is a 57 y.o. female was seen and evaluated face to face by this provider, who she recognizes from prior evals.  Patient seen and reassessed by the psychiatric nurse practitioner.  During reevaluation patient was observed to be making multiple attempts to exit bed, difficult to redirect.  Patient reports she is leaving to go get her a cold beer.  Despite multiple attempts to help patient remain in bed, reorient patient to current surroundings and reason for admission, patient continued to exit bed.  While redirecting patient, she did ball up her right fist " I will knock the shit of you if you touch me. I am going to get me a beer. Dont fucking touch me. "  This nurse practitioner assisted nursing staff to place patient back in bed with a Posey belt restraint.  Patient did not inquire reason for restraint, and tried to negotiate " I was just going to get a beer and come back."   Patient continues to be alert and oriented x 2, remains confused and delirious.  Patient did not appear to be interested in speaking with family to include her 2 daughters.  She continues to lack capacity, judgment, and insight regarding negative impacts of alcohol and her mental health.  She continues to require increased levels of lorazepam, prior to this psychiatric evaluation patient has not required any Haldol since 11/23/22.  Staff was  requested to administer Haldol, to control agitation, aggression, and alcohol related confusion, psychosis.  While patient denies being a danger to herself, she did threaten staff numerous times during this psychiatric reevaluation.  In addition to her attempts to leave and difficult to redirect, patient was identified a danger to self and others; new IVC paperwork was initiated.  IVC paperwork was completed with the assistance of T[employer].  See below for ongoing plan and management.  Family has been notified changes to plan.   HPI: per admission assessment note:  [name] is a 57 y.o. female with medical history significant of COPD with chronic hypoxic respiratory failure on 2 to 3 L as needed for activity at home, noncompliant with breathing medications, alcohol abuse,IIDM, chronic back pain and narcotic dependence, brought in by family member for evaluation of worsening of cough and shortness of breath.   Patient is sedated after given IV Ativan for-CIWA score unable to answer some of my questions.  She reported she has been running out of her oxygen for last 2 weeks as well as her COPD medications and started to have productive cough and shortness of breath for 7+ days denies any fever chills no chest pains.  She described the sputum as thick yellowish, she remembered had a temperature over the weekend of 101.0 but no chills.  Patient also reported daily drink of sixpacks beer every day and last drink was last night.  Past Psychiatric History " Polysubstance abuse, Depression, Schizoaffective;Bipolar type,  Previous dx's of schizophrenia, bipolar- Multiple inpatient stays (5)  [facility]; last inpatient admission [facility] in 01/2022 after suicide attempt- Follows up at [facility], previously had ACT team - hx of suicide attempt x2 via overdose as a teen, 12/2021 by OD"   Risk to Self:  Denies, despite endorsing overdose on admission Risk to Others:   yes, threatened  brother Prior Inpatient Therapy:   Recently 08/23 following serious suicide attempt of high lethality Prior Outpatient Therapy:  Denies  Past Medical History:  Past Medical History:  Diagnosis Date   Abdominal pain    Accidental drug overdose April 2013   Anxiety    Atrial fibrillation (HCC) 09/29/11   converted spontaneously   Chronic back pain    Chronic knee pain    Chronic nausea    Chronic pain    COPD (chronic obstructive pulmonary disease) (HCC)    Depression    Diabetes mellitus    states her doctor took her off all DM meds in past month   Diabetic neuropathy (HCC)    Dyspnea    with exertion    GERD (gastroesophageal reflux disease)    Headache(784.0)    migraines    HTN (hypertension)    not on meds since in a year    Hyperlipidemia    Hypothyroidism    not on meds in a while    Mental disorder    Bipolar and schizophrenic   Requires supplemental oxygen    as needed per patient    Schizophrenia (HCC)    Schizophrenia, acute (HCC) 11/13/2017   Tobacco abuse     Past Surgical History:  Procedure Laterality Date   ABDOMINAL HYSTERECTOMY     BLADDER SUSPENSION  03/04/2011   Procedure: [facility] PROCEDURE;  Surgeon: Jonna Coup MacDiarmid;  Location: [facility];  Service: Urology;  Laterality: N/A;   BOWEL RESECTION N/A 04/18/2022   Procedure: SMALL BOWEL RESECTION;  Surgeon: Harriette Bouillon, MD;  Location: [facility] OR;  Service: General;  Laterality: N/A;   CYSTOCELE REPAIR  03/04/2011   Procedure: ANTERIOR REPAIR (CYSTOCELE);  Surgeon: Martina Sinner;  Location: [facility];  Service: Urology;  Laterality: N/A;   CYSTOSCOPY  03/04/2011   Procedure: CYSTOSCOPY;  Surgeon: Jonna Coup MacDiarmid;  Location: [facility];  Service: Urology;  Laterality: N/A;   ESOPHAGOGASTRODUODENOSCOPY (EGD) WITH PROPOFOL N/A 05/12/2017   Procedure: ESOPHAGOGASTRODUODENOSCOPY (EGD) WITH PROPOFOL;  Surgeon: Ovidio Kin, MD;  Location: [facility];  Service: General;  Laterality: N/A;   GASTRIC ROUX-EN-Y N/A  03/25/2016   Procedure: LAPAROSCOPIC ROUX-EN-Y GASTRIC BYPASS WITH UPPER ENDOSCOPY;  Surgeon: Glenna Fellows, MD;  Location: [facility];  Service: General;  Laterality: N/A;   KNEE SURGERY     LAPAROSCOPIC ASSISTED VAGINAL HYSTERECTOMY  03/04/2011   Procedure: LAPAROSCOPIC ASSISTED VAGINAL HYSTERECTOMY;  Surgeon: Jeani Hawking, MD;  Location: [facility];  Service: Gynecology;  Laterality: N/A;   LAPAROTOMY N/A 04/18/2022   Procedure: EXPLORATORY LAPAROTOMY;  Surgeon: Harriette Bouillon, MD;  Location: [facility];  Service: General;  Laterality: N/A;   LAPAROTOMY N/A 04/24/2022   Procedure: BRING BACK EXPLORATORY LAPAROTOMY;  Surgeon: Violeta Gelinas, MD;  Location[facility] OR;  Service: General;  Laterality: N/A;   TOTAL HIP ARTHROPLASTY Right 08/27/2022   Procedure: TOTAL HIP ARTHROPLASTY;  Surgeon: Joen Laura, MD;  Location: [facility] OR;  Service: Orthopedics;  Laterality: Right;   Family History:  Family History  Problem Relation Age of Onset   Heart attack Father        29s   Diabetes Mother  Heart disease Mother    Hypertension Mother    Heart attack Sister        91   COPD Other    Breast cancer Neg Hx    Family Psychiatric  History:  Social History:  Social History   Substance and Sexual Activity  Alcohol Use Yes   Comment: 40 ounces daily     Social History   Substance and Sexual Activity  Drug Use Yes   Types: Cocaine, Marijuana, "Crack" cocaine   Comment: last use was about a week ago    Social History   Socioeconomic History   Marital status: Legally Separated    Spouse name: Not on file   Number of children: Not on file   Years of education: Not on file   Highest education level: Not on file  Occupational History   Not on file  Tobacco Use   Smoking status: Every Day    Packs/day: 1    Types: Cigarettes   Smokeless tobacco: Never  Vaping Use   Vaping Use: Never used  Substance and Sexual Activity   Alcohol use: Yes    Comment: 40 ounces daily   Drug use: Yes     Types: Cocaine, Marijuana, "Crack" cocaine    Comment: last use was about a week ago   Sexual activity: Yes    Partners: Male  Other Topics Concern   Not on file  Social History Narrative   Not on file   Social Determinants of Health   Financial Resource Strain: Not on file  Food Insecurity: No Food Insecurity (05/06/2022)   Hunger Vital Sign    Worried About Running Out of Food in the Last Year: Never true    Ran Out of Food in the Last Year: Never true  Transportation Needs: No Transportation Needs (05/06/2022)   PRAPARE - Administrator, Civil Service (Medical): No    Lack of Transportation (Non-Medical): No  Physical Activity: Not on file  Stress: Not on file  Social Connections: Moderately Isolated (05/06/2022)   Social Connection and Isolation Panel [NHANES]    Frequency of Communication with Friends and Family: More than three times a week    Frequency of Social Gatherings with Friends and Family: Twice a week    Attends Religious Services: 1 to 4 times per year    Active Member of [employer] or Organizations: No    Attends Banker Meetings: Never    Marital Status: Separated   Additional Social History:    Allergies:   Allergies  Allergen Reactions   Iron Dextran Shortness Of Breath and Anxiety   Nsaids Other (See Comments)    H/o gastric bypass - can cause ulcers   Aspirin Nausea And Vomiting and Other (See Comments)    Ok to take tylenol or ibuprofen    Penicillins Other (See Comments)    Unknown. Childhood reaction     Labs:  Results for orders placed or performed during the hospital encounter of 11/20/22 (from the past 48 hour(s))  Glucose, capillary     Status: Abnormal   Collection Time: 11/23/22  4:55 PM  Result Value Ref Range   Glucose-Capillary 141 (H) 70 - 99 mg/dL    Comment: Glucose reference range applies only to samples taken after fasting for at least 8 hours.  Glucose, capillary     Status: None   Collection Time:  11/23/22  7:50 PM  Result Value Ref Range   Glucose-Capillary 82 70 -  99 mg/dL    Comment: Glucose reference range applies only to samples taken after fasting for at least 8 hours.  Glucose, capillary     Status: Abnormal   Collection Time: 11/23/22 11:06 PM  Result Value Ref Range   Glucose-Capillary 158 (H) 70 - 99 mg/dL    Comment: Glucose reference range applies only to samples taken after fasting for at least 8 hours.  Glucose, capillary     Status: Abnormal   Collection Time: 11/24/22  4:01 AM  Result Value Ref Range   Glucose-Capillary 135 (H) 70 - 99 mg/dL    Comment: Glucose reference range applies only to samples taken after fasting for at least 8 hours.  Glucose, capillary     Status: None   Collection Time: 11/24/22  9:16 AM  Result Value Ref Range   Glucose-Capillary 85 70 - 99 mg/dL    Comment: Glucose reference range applies only to samples taken after fasting for at least 8 hours.  Basic metabolic panel     Status: Abnormal   Collection Time: 11/24/22 10:10 AM  Result Value Ref Range   Sodium 140 135 - 145 mmol/L   Potassium 4.7 3.5 - 5.1 mmol/L   Chloride 96 (L) 98 - 111 mmol/L   CO2 33 (H) 22 - 32 mmol/L   Glucose, Bld 112 (H) 70 - 99 mg/dL    Comment: Glucose reference range applies only to samples taken after fasting for at least 8 hours.   BUN 9 6 - 20 mg/dL   Creatinine, Ser 1.61 (L) 0.44 - 1.00 mg/dL   Calcium 8.7 (L) 8.9 - 10.3 mg/dL   GFR, Estimated >09 >60 mL/min    Comment: (NOTE) Calculated using the CKD-EPI Creatinine Equation (2021)    Anion gap 11 5 - 15    Comment: Performed at [facility], [address], [place], [place] [address]  CBC     Status: Abnormal   Collection Time: 11/24/22 10:10 AM  Result Value Ref Range   WBC 10.0 4.0 - 10.5 K/uL   RBC 5.18 (H) 3.87 - 5.11 MIL/uL   Hemoglobin 11.5 (L) 12.0 - 15.0 g/dL   HCT 81.1 91.4 - 78.2 %   MCV 78.6 (L) 80.0 - 100.0 fL   MCH 22.2 (L) 26.0 - 34.0 pg   MCHC 28.3 (L) 30.0 - 36.0 g/dL    RDW 95.6 (H) 21.3 - 15.5 %   Platelets 279 150 - 400 K/uL   nRBC 0.0 0.0 - 0.2 %    Comment: Performed at [facility], [address] [place], [place] [address]  Procalcitonin     Status: None   Collection Time: 11/24/22 10:47 AM  Result Value Ref Range   Procalcitonin <0.10 ng/mL    Comment:        Interpretation: PCT (Procalcitonin) <= 0.5 ng/mL: Systemic infection (sepsis) is not likely. Local bacterial infection is possible. (NOTE)       Sepsis PCT Algorithm           Lower Respiratory Tract                                      Infection PCT Algorithm    ----------------------------     ----------------------------         PCT < 0.25 ng/mL  PCT < 0.10 ng/mL          Strongly encourage             Strongly discourage   discontinuation of antibiotics    initiation of antibiotics    ----------------------------     -----------------------------       PCT 0.25 - 0.50 ng/mL            PCT 0.10 - 0.25 ng/mL               OR       >80% decrease in PCT            Discourage initiation of                                            antibiotics      Encourage discontinuation           of antibiotics    ----------------------------     -----------------------------         PCT >= 0.50 ng/mL              PCT 0.26 - 0.50 ng/mL               AND        <80% decrease in PCT             Encourage initiation of                                             antibiotics       Encourage continuation           of antibiotics    ----------------------------     -----------------------------        PCT >= 0.50 ng/mL                  PCT > 0.50 ng/mL               AND         increase in PCT                  Strongly encourage                                      initiation of antibiotics    Strongly encourage escalation           of antibiotics                                     -----------------------------                                           PCT <= 0.25 ng/mL                                                  OR                                        >  80% decrease in PCT                                      Discontinue / Do not initiate                                             antibiotics  Performed at [facility], [address], [place], [place] [address]   Glucose, capillary     Status: Abnormal   Collection Time: 11/24/22 12:09 PM  Result Value Ref Range   Glucose-Capillary 173 (H) 70 - 99 mg/dL    Comment: Glucose reference range applies only to samples taken after fasting for at least 8 hours.  Glucose, capillary     Status: Abnormal   Collection Time: 11/24/22  4:56 PM  Result Value Ref Range   Glucose-Capillary 240 (H) 70 - 99 mg/dL    Comment: Glucose reference range applies only to samples taken after fasting for at least 8 hours.  Glucose, capillary     Status: Abnormal   Collection Time: 11/24/22  7:55 PM  Result Value Ref Range   Glucose-Capillary 202 (H) 70 - 99 mg/dL    Comment: Glucose reference range applies only to samples taken after fasting for at least 8 hours.  Glucose, capillary     Status: Abnormal   Collection Time: 11/24/22 11:17 PM  Result Value Ref Range   Glucose-Capillary 131 (H) 70 - 99 mg/dL    Comment: Glucose reference range applies only to samples taken after fasting for at least 8 hours.  CBC with Differential/Platelet     Status: Abnormal   Collection Time: 11/25/22  1:48 AM  Result Value Ref Range   WBC 13.6 (H) 4.0 - 10.5 K/uL   RBC 4.63 3.87 - 5.11 MIL/uL   Hemoglobin 10.4 (L) 12.0 - 15.0 g/dL   HCT 60.4 (L) 54.0 - 98.1 %   MCV 76.2 (L) 80.0 - 100.0 fL   MCH 22.5 (L) 26.0 - 34.0 pg   MCHC 29.5 (L) 30.0 - 36.0 g/dL   RDW 19.1 (H) 47.8 - 29.5 %   Platelets 302 150 - 400 K/uL   nRBC 0.0 0.0 - 0.2 %   Neutrophils Relative % 76 %   Neutro Abs 10.3 (H) 1.7 - 7.7 K/uL   Lymphocytes Relative 15 %   Lymphs Abs 2.1 0.7 - 4.0 K/uL   Monocytes Relative 8 %   Monocytes Absolute 1.0 0.1 - 1.0  K/uL   Eosinophils Relative 1 %   Eosinophils Absolute 0.1 0.0 - 0.5 K/uL   Basophils Relative 0 %   Basophils Absolute 0.0 0.0 - 0.1 K/uL   Immature Granulocytes 0 %   Abs Immature Granulocytes 0.06 0.00 - 0.07 K/uL    Comment: Performed at [facility], [address], [place], [place] [address]  Basic metabolic panel     Status: Abnormal   Collection Time: 11/25/22  1:48 AM  Result Value Ref Range   Sodium 141 135 - 145 mmol/L   Potassium 3.6 3.5 - 5.1 mmol/L   Chloride 101 98 - 111 mmol/L   CO2 31 22 - 32 mmol/L   Glucose, Bld 53 (L) 70 - 99 mg/dL    Comment: Glucose reference range applies only to  samples taken after fasting for at least 8 hours.   BUN 8 6 - 20 mg/dL   Creatinine, Ser 1.61 (L) 0.44 - 1.00 mg/dL   Calcium 8.5 (L) 8.9 - 10.3 mg/dL   GFR, Estimated >09 >60 mL/min    Comment: (NOTE) Calculated using the CKD-EPI Creatinine Equation (2021)    Anion gap 9 5 - 15    Comment: Performed at [facility], [address] [place], [place] [address]  Glucose, capillary     Status: Abnormal   Collection Time: 11/25/22  3:23 AM  Result Value Ref Range   Glucose-Capillary 119 (H) 70 - 99 mg/dL    Comment: Glucose reference range applies only to samples taken after fasting for at least 8 hours.  Glucose, capillary     Status: None   Collection Time: 11/25/22  9:05 AM  Result Value Ref Range   Glucose-Capillary 94 70 - 99 mg/dL    Comment: Glucose reference range applies only to samples taken after fasting for at least 8 hours.  Glucose, capillary     Status: Abnormal   Collection Time: 11/25/22 11:25 AM  Result Value Ref Range   Glucose-Capillary 281 (H) 70 - 99 mg/dL    Comment: Glucose reference range applies only to samples taken after fasting for at least 8 hours.    Current Facility-Administered Medications  Medication Dose Route Frequency Provider Last Rate Last Admin   acetaminophen (TYLENOL) tablet 650 mg  650 mg Oral Q6H PRN Emeline General, MD    650 mg at 11/24/22 1942   Or   acetaminophen (TYLENOL) suppository 650 mg  650 mg Rectal Q6H PRN Mikey College T, MD       albuterol (PROVENTIL) (2.5 MG/3ML) 0.083% nebulizer solution 2.5 mg  2.5 mg Nebulization Q2H PRN Emeline General, MD       arformoterol [facility]) nebulizer solution 15 mcg  15 mcg Nebulization BID Gleason, Darcella Gasman, PA-C   15 mcg at 11/24/22 1956   budesonide (PULMICORT) nebulizer solution 0.5 mg  0.5 mg Nebulization Q12H Dahal, Melina Schools, MD   0.5 mg at 11/24/22 1957   calcium carbonate (TUMS - dosed in mg elemental calcium) chewable tablet 200 mg of elemental calcium  1 tablet Oral TID Icard, Bradley L, DO   200 mg of elemental calcium at 11/25/22 0908   chlordiazePOXIDE (LIBRIUM) capsule 50 mg  50 mg Oral Once Icard, Bradley L, DO       Chlorhexidine Gluconate Cloth 2 % PADS 6 each  6 each Topical Daily Agarwala, Daleen Bo, MD   6 each at 11/25/22 0908   dextrose 50 % solution 50 mL  1 ampule Intravenous Once Agarwala, Daleen Bo, MD       docusate sodium (COLACE) capsule 100 mg  100 mg Oral BID PRN Gleason, Darcella Gasman, PA-C       enoxaparin (LOVENOX) injection 40 mg  40 mg Subcutaneous Q24H Chipper Herb, Ping T, MD   40 mg at 11/24/22 1942   feeding supplement (ENSURE ENLIVE / ENSURE PLUS) liquid 237 mL  237 mL Oral TID BM Icard, Bradley L, DO   237 mL at 11/23/22 1351   folic acid (FOLVITE) tablet 1 mg  1 mg Oral Daily Lonell Grandchild, MD   1 mg at 11/25/22 0908   guaiFENesin (MUCINEX) 12 hr tablet 1,200 mg  1,200 mg Oral BID Mikey College T, MD   1,200 mg at 11/25/22 0908   haloperidol lactate (HALDOL) injection 5 mg  5 mg Intravenous Q6H PRN Icard, Bradley L, DO   5 mg at 11/25/22 1328   insulin aspart (novoLOG) injection 0-5 Units  0-5 Units Subcutaneous QHS Dahal, Binaya, MD       insulin aspart (novoLOG) injection 0-9 Units  0-9 Units Subcutaneous TID WC Dahal, Binaya, MD       insulin glargine-yfgn (SEMGLEE) injection 8 Units  8 Units Subcutaneous Daily Desai, Rahul P, PA-C   8 Units at  11/25/22 8119   LORazepam (ATIVAN) injection 2-4 mg  2-4 mg Intravenous Q1H PRN Icard, Bradley L, DO   4 mg at 11/25/22 1158   metoprolol tartrate (LOPRESSOR) tablet 25 mg  25 mg Oral BID Dahal, Melina Schools, MD   25 mg at 11/24/22 2107   multivitamin with minerals tablet 1 tablet  1 tablet Oral BID Icard, Bradley L, DO   1 tablet at 11/25/22 1478   nicotine (NICODERM CQ - dosed in mg/24 hours) patch 21 mg  21 mg Transdermal Daily Mikey College T, MD   21 mg at 11/25/22 0906   OLANZapine zydis (ZYPREXA) disintegrating tablet 10 mg  10 mg Oral QHS Icard, Bradley L, DO   10 mg at 11/24/22 2108   polyethylene glycol (MIRALAX / GLYCOLAX) packet 17 g  17 g Oral Daily PRN Gleason, Darcella Gasman, PA-C       revefenacin (YUPELRI) nebulizer solution 175 mcg  175 mcg Nebulization Daily Gleason, Darcella Gasman, PA-C   175 mcg at 11/24/22 0843   thiamine (VITAMIN B1) 500 mg in sodium chloride 0.9 % 50 mL IVPB  500 mg Intravenous Q24H Maryagnes Amos, FNP   Stopped at 11/24/22 2138    Musculoskeletal:  Psychiatric Specialty Exam:  Presentation  General Appearance:  Disheveled  Eye Contact: None  Speech: Clear and Coherent; Normal Rate  Speech Volume: Increased  Handedness: Right   Mood and Affect  Mood: Irritable; Labile  Affect: Blunt; Labile; Full Range   Thought Process  Thought Processes: Irrevelant; Coherent  Descriptions of Associations:Intact  Orientation:Partial  Thought Content:Illogical  History of Schizophrenia/Schizoaffective disorder:Yes Duration of Psychotic Symptoms:Greater than six months Hallucinations:Hallucinations: None  Ideas of Reference:None  Suicidal Thoughts:Denies  Homicidal Thoughts:Passive denial   Sensorium  Memory: Immediate Poor; Recent Fair; Remote Fair  Judgment: Impaired  Insight: Lacking   Executive Functions  Concentration: Poor  Attention Span: Poor  Recall: Poor  Fund of Knowledge: Fair  Language: Fair   Psychomotor  Activity  Psychomotor Activity:Psychomotor Activity: Restlessness (psychomotor agitation)   Assets  Assets: Communication Skills; Social Support   Sleep  Sleep:Sleep: Poor   Physical Exam: Physical Exam Vitals and nursing note reviewed.  Constitutional:      Appearance: Normal appearance.  Pulmonary:     Effort: Tachypnea present.     Comments: Coughing after eating and drinking.  Skin:    General: Skin is warm and dry.     Capillary Refill: Capillary refill takes less than 2 seconds.  Neurological:     General: No focal deficit present.     Mental Status: She is alert. Mental status is at baseline. She is disoriented.  Psychiatric:        Attention and Perception: She is inattentive.        Mood and Affect: Mood is anxious.        Speech: Speech is slurred.        Behavior: Behavior is slowed.        Thought Content: Thought content is not paranoid or delusional.  Thought content does not include homicidal or suicidal ideation. Thought content does not include homicidal or suicidal plan.        Cognition and Memory: Cognition is impaired. Memory is impaired.      Review of Systems  Psychiatric/Behavioral:  Positive for substance abuse.   All other systems reviewed and are negative.  Blood pressure 103/61, pulse (!) 122, temperature 97.7 F (36.5 C), temperature source Axillary, resp. rate (!) 46, height 5\' 2"  (1.575 m), weight 52 kg, last menstrual period 01/08/2011, SpO2 100 %. Body mass index is 20.97 kg/m.  Treatment Plan Summary: Daily contact with patient to assess and evaluate symptoms and progress in treatment, Medication management, and Plan   Continue IV medications at this time due to possibility of aspiration. SLP evaluation has been placed.  Barium swallow ordered, procedure completed prior to psychiatric reevaluation. -Continue agitation protocol Continue delirium precautions-Patient high risk for delirium/delirium tremens.  Recommend Recruitment consultant for  AMS/Psychosis.  Safety sitter ordered. -Will order thiamine 500mg  IV x 3 days.  -IVC initiated. -Posey belt restraining order due to time sensitivity, increased safety risks and hazards. -->  Primary team to order for ongoing needs.  Continue CIWA and Ativan protocol at this time. Recommend Symptom triggered vital signs scoring.   Labs reviewed and assessed. Patient with multiple abnormalities --> defer to primary team. Elevated Dimer (0.77). PCR +Influenza EKG  11/24/22 -QTc 425 corrected [employer] model.      Disposition:  Disposition is pending at this time. Patient is not medically stable to make final disposition. Psychiatry will continue to follow.      Maryagnes Amos, FNP 11/25/2022 3:39 PM
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
## 2022-11-25 NOTE — Progress Notes (Signed)
Modified Barium Swallow Study  Patient Details  Name: [name] MRN: [id number] Date of Birth: 1965-11-15  Today's Date: 11/25/2022  Modified Barium Swallow completed.  Full report located under Chart Review in the Imaging Section.  History of Present Illness [name] is a 57 y.o. female admitted 5/30 with SOB, AMS, COPD and ETOH withdrawal.  Drinks daily and cocaine postive on admit.  Postive Flu.  PMH significant for DM2, HTN, HLD, A-fib, COPD, schizophrenia, bipolar, anxiety, depression, chronic back pain, neuropathy, hypothyroidism polysubstance abuse (alcohol, tobacco, cocaine) and noncompliant to medications, oxygen or follow-ups. Noted to be coughing after meds. MBS with aspiration of thin 02/16/22 with nectar recommended and Dys 3   Clinical Impression Pt exhibited mild oropharyngeal dysphagia impacted by mild lethargy, delayed processing and decreased respiratory status with RR fluctuating (21-44). She demonstrated decreased oral cohesion, control with barium falling under tongue and with premature spill over tongue base x 1. Delayed swallow initiation with honey and puree consistencies reaching valleculae for 5-7 seconds before swallow initiated. Thin barium was penetrated from residue to the vocal cords after the swallow and once with nectar. Pt produced spontaneous throat clear once and needed verbal cues to clear throat which removed penetrates momentarily only to return to vocal cord level with thin. Reduced tongue base retraction led to minimal residue on base of tongue, in valleculae and pyriform sinuses. She had delays performing second swallows but once able to initiate cleared majority of residue. There was minimally decreased PES distention with trace residue intermittently below level of PES. Esophageal sweep was unremarkable. Recommend Dys 1 (puree) texture, nectar thick liquids, swallow 2 times, rest breaks if lethargic or increased respiratory rate and full assist with  meals for strategies. Factors that may increase risk of adverse event in presence of aspiration [name] [name] 2021): Frail or deconditioned;Reduced cognitive function;Poor general health and/or compromised immunity  Swallow Evaluation Recommendations Recommendations: PO diet PO Diet Recommendation: Dysphagia 1 (Pureed);Mildly thick liquids (Level 2, nectar thick) Liquid Administration via: Cup;Straw Medication Administration: Crushed with puree Supervision: Staff to assist with self-feeding;Full supervision/cueing for swallowing strategies Swallowing strategies  : Slow rate;Small bites/sips;Multiple dry swallows after each bite/sip Postural changes: Position pt fully upright for meals Oral care recommendations: Oral care BID (2x/day)      [name] 11/25/2022,2:41 PM

## 2022-11-25 NOTE — Inpatient Diabetes Management (Signed)
Inpatient Diabetes Program Recommendations  AACE/ADA: New Consensus Statement on Inpatient Glycemic Control (2015)  Target Ranges:  Prepandial:   less than 140 mg/dL      Peak postprandial:   less than 180 mg/dL (1-2 hours)      Critically ill patients:  140 - 180 mg/dL   Lab Results  Component Value Date   GLUCAP 281 (H) 11/25/2022   HGBA1C 7.3 (H) 09/19/2022    Review of Glycemic Control  Latest Reference Range & Units 11/24/22 19:55 11/24/22 23:17 11/25/22 03:23 11/25/22 09:05 11/25/22 11:25  Glucose-Capillary 70 - 99 mg/dL 161 (H) 096 (H) 045 (H) 94 281 (H)  (H): Data is abnormally high Diabetes history: Type 2 DM Outpatient Diabetes medications: none Current orders for Inpatient glycemic control: Novolog 0-9 units Q4H, Semglee 8 units QD   Inpatient Diabetes Program Recommendations:     Consider: -Changing correction to Novolog 0-9 units TID & HS   Thanks, Lujean Rave, MSN, RNC-OB Diabetes Coordinator [phone] (8a-5p)

## 2022-11-25 NOTE — TOC Progression Note (Signed)
Transition of Care [facility]) - Progression Note    Patient Details  Name: [name] MRN: [id number] Date of Birth: [birth year]  [facility] CM/SW Contact  Erin Sons, [place] Phone Number: 11/25/2022, 3:12 PM  Clinical Narrative:     Signed and notarized IVC petition submitted electronically to [facility].   Magistrate sent back Custody Order for Involuntary Commitment. Four Copies of these documents were placed on pt's chart.   CSW made request for law enforcement to serve papers. Law enforcement will come to the unit for service only of IVC; no ETA available.    Expected Discharge Plan: Home w Home Health Services Barriers to Discharge: Continued Medical Work up  Expected Discharge Plan and Services In-house Referral: NA Discharge Planning Services: CM Consult   Living arrangements for the past 2 months: Single Family Home                                       Social Determinants of Health (SDOH) Interventions SDOH Screenings   Food Insecurity: No Food Insecurity (05/06/2022)  Housing: Low Risk  (05/06/2022)  Transportation Needs: No Transportation Needs (05/06/2022)  Utilities: Not At Risk (05/06/2022)  Alcohol Screen: Medium Risk (08/04/2018)  Social Connections: Moderately Isolated (05/06/2022)  Tobacco Use: High Risk (11/25/2022)    Readmission Risk Interventions    11/21/2022    1:16 PM 04/28/2022    2:18 PM 04/07/2022   12:41 PM  Readmission Risk Prevention Plan  Transportation Screening Complete Complete Complete  HRI or Home Care Consult   Complete  Social Work Consult for Recovery Care Planning/Counseling   Complete  Palliative Care Screening   Not Applicable  Medication Review [employer]) Referral to Pharmacy Complete Complete  PCP or Specialist appointment within 3-5 days of discharge  Complete   HRI or Home Care Consult Complete Complete   SW Recovery Care/Counseling Consult  Complete   Palliative Care Screening  Not Applicable Not Applicable   Skilled Nursing Facility Not Applicable Not Applicable

## 2022-11-26 DIAGNOSIS — F10931 Alcohol use, unspecified with withdrawal delirium: Secondary | ICD-10-CM

## 2022-11-26 DIAGNOSIS — G9340 Encephalopathy, unspecified: Secondary | ICD-10-CM | POA: Diagnosis not present

## 2022-11-26 LAB — GLUCOSE, CAPILLARY
Glucose-Capillary: 100 mg/dL — ABNORMAL HIGH (ref 70–99)
Glucose-Capillary: 129 mg/dL — ABNORMAL HIGH (ref 70–99)
Glucose-Capillary: 163 mg/dL — ABNORMAL HIGH (ref 70–99)
Glucose-Capillary: 164 mg/dL — ABNORMAL HIGH (ref 70–99)
Glucose-Capillary: 292 mg/dL — ABNORMAL HIGH (ref 70–99)
Glucose-Capillary: 83 mg/dL (ref 70–99)

## 2022-11-26 LAB — VITAMIN C: Vitamin C: 1.1 mg/dL (ref 0.4–2.0)

## 2022-11-26 MED ORDER — LORAZEPAM 2 MG/ML IJ SOLN: 1.0000 mg | INTRAMUSCULAR | Status: DC | PRN

## 2022-11-26 MED ORDER — THIAMINE MONONITRATE 100 MG PO TABS: 100.0000 mg | ORAL_TABLET | Freq: Every day | ORAL | Status: DC

## 2022-11-26 MED ORDER — LORAZEPAM 2 MG/ML IJ SOLN
2.0000 mg | Freq: Four times a day (QID) | INTRAMUSCULAR | Status: DC | PRN
  Administered 2022-11-26: 2 mg via INTRAVENOUS
  Filled 2022-11-26: qty 1

## 2022-11-26 MED ORDER — LORAZEPAM 1 MG PO TABS: 1.0000 mg | ORAL_TABLET | ORAL | Status: DC | PRN

## 2022-11-26 MED ORDER — ORAL CARE MOUTH RINSE: 15.0000 mL | OROMUCOSAL | Status: DC | PRN

## 2022-11-26 MED ORDER — ORAL CARE MOUTH RINSE
15.0000 mL | OROMUCOSAL | Status: DC
  Administered 2022-11-26 – 2022-11-30 (×16): 15 mL via OROMUCOSAL

## 2022-11-26 MED ORDER — THIAMINE HCL 100 MG/ML IJ SOLN: 100.0000 mg | Freq: Every day | INTRAMUSCULAR | Status: DC

## 2022-11-26 MED ORDER — LORAZEPAM 2 MG/ML IJ SOLN: 2.0000 mg | Freq: Four times a day (QID) | INTRAMUSCULAR | Status: DC | PRN

## 2022-11-26 MED ORDER — THIAMINE HCL 100 MG/ML IJ SOLN
100.0000 mg | Freq: Every day | INTRAMUSCULAR | Status: DC
  Filled 2022-11-26: qty 2

## 2022-11-26 MED ORDER — THIAMINE MONONITRATE 100 MG PO TABS
100.0000 mg | ORAL_TABLET | Freq: Every day | ORAL | Status: DC
  Administered 2022-11-27 – 2022-12-01 (×5): 100 mg via ORAL
  Filled 2022-11-26 (×6): qty 1

## 2022-11-26 NOTE — Progress Notes (Signed)
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 (11/25/22 2101)     LOS: 6 days    Time spent: 50 mins    Willeen Niece, MD [facility]   If 7PM-7AM, please contact night-coverage

## 2022-11-26 NOTE — Progress Notes (Signed)
SLP Cancellation Note  Patient Details Name: [name] MRN: [id number] DOB: 11/16/1965   Cancelled treatment:        Attempted to see earlier this am but pt had sedating medication earlier and could not maintain her alertness for po's with max stimuli. Will continue efforts.    [name] 11/26/2022, 11:31 AM

## 2022-11-26 NOTE — Consult Note (Signed)
[facility] Face-to-Face Psychiatry Consult   Reason for Consult:   Aggressive Behavior  Referring Physician:  Oswald Hillock Patient Identification: [name] MRN:  [id number] Principal Diagnosis: Alcohol withdrawal (HCC) Diagnosis:  Principal Problem:   Alcohol withdrawal (HCC) Active Problems:   COPD (chronic obstructive pulmonary disease) (HCC)   Encephalopathy   Acute respiratory failure with hypoxia and hypercapnia (HCC)   Withdrawal symptoms, alcohol (HCC)   Pneumonia   Malnutrition of moderate degree   Total Time spent with patient: 15 minutes  Subjective:   [name] is a 57 y.o. female was seen and evaluated face to face by this provider, who she recognizes from prior evals.  Patient seen and attempted to assess. Patient appeared to be asleep and difficult to arouse. She remains restrained with a posey belt at this time. Per nursing she remains confused and disoriented at times. She did have some periods of lucidity(able to eat lunch and toilet with a assistance). Patient continues to have a good therapeutic response to ativan. Her ativan requirements have ranged from 2mg  -22mg , lower dosage when co-administered with Librium.   HPI: per admission assessment note:  [name] is a 57 y.o. female with medical history significant of COPD with chronic hypoxic respiratory failure on 2 to 3 L as needed for activity at home, noncompliant with breathing medications, alcohol abuse,IIDM, chronic back pain and narcotic dependence, brought in by family member for evaluation of worsening of cough and shortness of breath.   Patient is sedated after given IV Ativan for-CIWA score unable to answer some of my questions.  She reported she has been running out of her oxygen for last 2 weeks as well as her COPD medications and started to have productive cough and shortness of breath for 7+ days denies any fever chills no chest pains.  She described the sputum as thick yellowish, she remembered  had a temperature over the weekend of 101.0 but no chills.  Patient also reported daily drink of sixpacks beer every day and last drink was last night.  Past Psychiatric History " Polysubstance abuse, Depression, Schizoaffective;Bipolar type,  Previous dx's of schizophrenia, bipolar- Multiple inpatient stays (5) [facility], [facility]; last inpatient admission [facility] in 01/2022 after suicide attempt- Follows up at [facility], previously had ACT team - hx of suicide attempt x2 via overdose as a teen, 12/2021 by OD"   Risk to Self:  Denies, despite endorsing overdose on admission Risk to Others:   yes, threatened brother Prior Inpatient Therapy:   Recently 08/23 following serious suicide attempt of high lethality Prior Outpatient Therapy:  Denies  Past Medical History:  Past Medical History:  Diagnosis Date   Abdominal pain    Accidental drug overdose April 2013   Anxiety    Atrial fibrillation (HCC) 09/29/11   converted spontaneously   Chronic back pain    Chronic knee pain    Chronic nausea    Chronic pain    COPD (chronic obstructive pulmonary disease) (HCC)    Depression    Diabetes mellitus    states her doctor took her off all DM meds in past month   Diabetic neuropathy (HCC)    Dyspnea    with exertion    GERD (gastroesophageal reflux disease)    Headache(784.0)    migraines    HTN (hypertension)    not on meds since in a year    Hyperlipidemia    Hypothyroidism    not on meds in  a while    Mental disorder    Bipolar and schizophrenic   Requires supplemental oxygen    as needed per patient    Schizophrenia (HCC)    Schizophrenia, acute (HCC) 11/13/2017   Tobacco abuse     Past Surgical History:  Procedure Laterality Date   ABDOMINAL HYSTERECTOMY     BLADDER SUSPENSION  03/04/2011   Procedure: [facility] PROCEDURE;  Surgeon: Jonna Coup MacDiarmid;  Location: [facility];  Service: Urology;  Laterality: N/A;   BOWEL RESECTION N/A 04/18/2022   Procedure: SMALL BOWEL  RESECTION;  Surgeon: Harriette Bouillon, MD;  Location: [facility] OR;  Service: General;  Laterality: N/A;   CYSTOCELE REPAIR  03/04/2011   Procedure: ANTERIOR REPAIR (CYSTOCELE);  Surgeon: Martina Sinner;  Location: [facility];  Service: Urology;  Laterality: N/A;   CYSTOSCOPY  03/04/2011   Procedure: CYSTOSCOPY;  Surgeon: Jonna Coup MacDiarmid;  Location: [facility];  Service: Urology;  Laterality: N/A;   ESOPHAGOGASTRODUODENOSCOPY (EGD) WITH PROPOFOL N/A 05/12/2017   Procedure: ESOPHAGOGASTRODUODENOSCOPY (EGD) WITH PROPOFOL;  Surgeon: Ovidio Kin, MD;  Location: [facility];  Service: General;  Laterality: N/A;   GASTRIC ROUX-EN-Y N/A 03/25/2016   Procedure: LAPAROSCOPIC ROUX-EN-Y GASTRIC BYPASS WITH UPPER ENDOSCOPY;  Surgeon: Glenna Fellows, MD;  Location: [facility];  Service: General;  Laterality: N/A;   KNEE SURGERY     LAPAROSCOPIC ASSISTED VAGINAL HYSTERECTOMY  03/04/2011   Procedure: LAPAROSCOPIC ASSISTED VAGINAL HYSTERECTOMY;  Surgeon: Jeani Hawking, MD;  Location: [facility];  Service: Gynecology;  Laterality: N/A;   LAPAROTOMY N/A 04/18/2022   Procedure: EXPLORATORY LAPAROTOMY;  Surgeon: Harriette Bouillon, MD;  Location: [facility];  Service: General;  Laterality: N/A;   LAPAROTOMY N/A 04/24/2022   Procedure: BRING BACK EXPLORATORY LAPAROTOMY;  Surgeon: Violeta Gelinas, MD;  Location: [facility] OR;  Service: General;  Laterality: N/A;   TOTAL HIP ARTHROPLASTY Right 08/27/2022   Procedure: TOTAL HIP ARTHROPLASTY;  Surgeon: Joen Laura, MD;  Location: [facility];  Service: Orthopedics;  Laterality: Right;   Family History:  Family History  Problem Relation Age of Onset   Heart attack Father        21s   Diabetes Mother    Heart disease Mother    Hypertension Mother    Heart attack Sister        53   COPD Other    Breast cancer Neg Hx    Family Psychiatric  History:  Social History:  Social History   Substance and Sexual Activity  Alcohol Use Yes   Comment: 40 ounces daily     Social History    Substance and Sexual Activity  Drug Use Yes   Types: Cocaine, Marijuana, "Crack" cocaine   Comment: last use was about a week ago    Social History   Socioeconomic History   Marital status: Legally Separated    Spouse name: Not on file   Number of children: Not on file   Years of education: Not on file   Highest education level: Not on file  Occupational History   Not on file  Tobacco Use   Smoking status: Every Day    Packs/day: 1    Types: Cigarettes   Smokeless tobacco: Never  Vaping Use   Vaping Use: Never used  Substance and Sexual Activity   Alcohol use: Yes    Comment: 40 ounces daily   Drug use: Yes    Types: Cocaine, Marijuana, "Crack" cocaine    Comment: last use was about a week ago  Sexual activity: Yes    Partners: Male  Other Topics Concern   Not on file  Social History Narrative   Not on file   Social Determinants of Health   Financial Resource Strain: Not on file  Food Insecurity: No Food Insecurity (05/06/2022)   Hunger Vital Sign    Worried About Running Out of Food in the Last Year: Never true    Ran Out of Food in the Last Year: Never true  Transportation Needs: No Transportation Needs (05/06/2022)   PRAPARE - Administrator, Civil Service (Medical): No    Lack of Transportation (Non-Medical): No  Physical Activity: Not on file  Stress: Not on file  Social Connections: Moderately Isolated (05/06/2022)   Social Connection and Isolation Panel [NHANES]    Frequency of Communication with Friends and Family: More than three times a week    Frequency of Social Gatherings with Friends and Family: Twice a week    Attends Religious Services: 1 to 4 times per year    Active Member of [employer] or Organizations: No    Attends Banker Meetings: Never    Marital Status: Separated   Additional Social History:    Allergies:   Allergies  Allergen Reactions   Iron Dextran Shortness Of Breath and Anxiety   Nsaids Other (See  Comments)    H/o gastric bypass - can cause ulcers   Aspirin Nausea And Vomiting and Other (See Comments)    Ok to take tylenol or ibuprofen    Penicillins Other (See Comments)    Unknown. Childhood reaction     Labs:  Results for orders placed or performed during the hospital encounter of 11/20/22 (from the past 48 hour(s))  Glucose, capillary     Status: Abnormal   Collection Time: 11/24/22  4:56 PM  Result Value Ref Range   Glucose-Capillary 240 (H) 70 - 99 mg/dL    Comment: Glucose reference range applies only to samples taken after fasting for at least 8 hours.  Glucose, capillary     Status: Abnormal   Collection Time: 11/24/22  7:55 PM  Result Value Ref Range   Glucose-Capillary 202 (H) 70 - 99 mg/dL    Comment: Glucose reference range applies only to samples taken after fasting for at least 8 hours.  Glucose, capillary     Status: Abnormal   Collection Time: 11/24/22 11:17 PM  Result Value Ref Range   Glucose-Capillary 131 (H) 70 - 99 mg/dL    Comment: Glucose reference range applies only to samples taken after fasting for at least 8 hours.  CBC with Differential/Platelet     Status: Abnormal   Collection Time: 11/25/22  1:48 AM  Result Value Ref Range   WBC 13.6 (H) 4.0 - 10.5 K/uL   RBC 4.63 3.87 - 5.11 MIL/uL   Hemoglobin 10.4 (L) 12.0 - 15.0 g/dL   HCT 29.5 (L) 62.1 - 30.8 %   MCV 76.2 (L) 80.0 - 100.0 fL   MCH 22.5 (L) 26.0 - 34.0 pg   MCHC 29.5 (L) 30.0 - 36.0 g/dL   RDW 65.7 (H) 84.6 - 96.2 %   Platelets 302 150 - 400 K/uL   nRBC 0.0 0.0 - 0.2 %   Neutrophils Relative % 76 %   Neutro Abs 10.3 (H) 1.7 - 7.7 K/uL   Lymphocytes Relative 15 %   Lymphs Abs 2.1 0.7 - 4.0 K/uL   Monocytes Relative 8 %   Monocytes Absolute 1.0 0.1 -  1.0 K/uL   Eosinophils Relative 1 %   Eosinophils Absolute 0.1 0.0 - 0.5 K/uL   Basophils Relative 0 %   Basophils Absolute 0.0 0.0 - 0.1 K/uL   Immature Granulocytes 0 %   Abs Immature Granulocytes 0.06 0.00 - 0.07 K/uL     Comment: Performed at [facility], [address] [place], [place] [address]  Basic metabolic panel     Status: Abnormal   Collection Time: 11/25/22  1:48 AM  Result Value Ref Range   Sodium 141 135 - 145 mmol/L   Potassium 3.6 3.5 - 5.1 mmol/L   Chloride 101 98 - 111 mmol/L   CO2 31 22 - 32 mmol/L   Glucose, Bld 53 (L) 70 - 99 mg/dL    Comment: Glucose reference range applies only to samples taken after fasting for at least 8 hours.   BUN 8 6 - 20 mg/dL   Creatinine, Ser 2.13 (L) 0.44 - 1.00 mg/dL   Calcium 8.5 (L) 8.9 - 10.3 mg/dL   GFR, Estimated >08 >65 mL/min    Comment: (NOTE) Calculated using the CKD-EPI Creatinine Equation (2021)    Anion gap 9 5 - 15    Comment: Performed at [facility], [address], [place], [place] [address]  Glucose, capillary     Status: Abnormal   Collection Time: 11/25/22  3:23 AM  Result Value Ref Range   Glucose-Capillary 119 (H) 70 - 99 mg/dL    Comment: Glucose reference range applies only to samples taken after fasting for at least 8 hours.  Glucose, capillary     Status: None   Collection Time: 11/25/22  9:05 AM  Result Value Ref Range   Glucose-Capillary 94 70 - 99 mg/dL    Comment: Glucose reference range applies only to samples taken after fasting for at least 8 hours.  Glucose, capillary     Status: Abnormal   Collection Time: 11/25/22 11:25 AM  Result Value Ref Range   Glucose-Capillary 281 (H) 70 - 99 mg/dL    Comment: Glucose reference range applies only to samples taken after fasting for at least 8 hours.  Glucose, capillary     Status: Abnormal   Collection Time: 11/25/22  4:11 PM  Result Value Ref Range   Glucose-Capillary 131 (H) 70 - 99 mg/dL    Comment: Glucose reference range applies only to samples taken after fasting for at least 8 hours.  Glucose, capillary     Status: Abnormal   Collection Time: 11/25/22  8:04 PM  Result Value Ref Range   Glucose-Capillary 190 (H) 70 - 99 mg/dL    Comment: Glucose  reference range applies only to samples taken after fasting for at least 8 hours.  Glucose, capillary     Status: Abnormal   Collection Time: 11/25/22  9:43 PM  Result Value Ref Range   Glucose-Capillary 261 (H) 70 - 99 mg/dL    Comment: Glucose reference range applies only to samples taken after fasting for at least 8 hours.   Comment 1 Notify RN   Glucose, capillary     Status: None   Collection Time: 11/26/22  6:34 AM  Result Value Ref Range   Glucose-Capillary 83 70 - 99 mg/dL    Comment: Glucose reference range applies only to samples taken after fasting for at least 8 hours.  Glucose, capillary     Status: Abnormal   Collection Time: 11/26/22  7:11 AM  Result Value Ref Range  Glucose-Capillary 100 (H) 70 - 99 mg/dL    Comment: Glucose reference range applies only to samples taken after fasting for at least 8 hours.  Glucose, capillary     Status: Abnormal   Collection Time: 11/26/22 10:49 AM  Result Value Ref Range   Glucose-Capillary 163 (H) 70 - 99 mg/dL    Comment: Glucose reference range applies only to samples taken after fasting for at least 8 hours.    Current Facility-Administered Medications  Medication Dose Route Frequency Provider Last Rate Last Admin   acetaminophen (TYLENOL) tablet 650 mg  650 mg Oral Q6H PRN Emeline General, MD   650 mg at 11/24/22 1942   Or   acetaminophen (TYLENOL) suppository 650 mg  650 mg Rectal Q6H PRN Mikey College T, MD       albuterol (PROVENTIL) (2.5 MG/3ML) 0.083% nebulizer solution 2.5 mg  2.5 mg Nebulization Q2H PRN Emeline General, MD       arformoterol [facility]) nebulizer solution 15 mcg  15 mcg Nebulization BID Gleason, Darcella Gasman, PA-C   15 mcg at 11/26/22 0815   budesonide (PULMICORT) nebulizer solution 0.5 mg  0.5 mg Nebulization Q12H Dahal, Melina Schools, MD   0.5 mg at 11/26/22 0815   calcium carbonate (TUMS - dosed in mg elemental calcium) chewable tablet 200 mg of elemental calcium  1 tablet Oral TID Icard, Bradley L, DO   200 mg of  elemental calcium at 11/26/22 1039   chlordiazePOXIDE (LIBRIUM) capsule 50 mg  50 mg Oral Once Icard, Bradley L, DO       Chlorhexidine Gluconate Cloth 2 % PADS 6 each  6 each Topical Daily Agarwala, Daleen Bo, MD   6 each at 11/26/22 0900   dextrose 50 % solution 50 mL  1 ampule Intravenous Once Agarwala, Daleen Bo, MD       docusate sodium (COLACE) capsule 100 mg  100 mg Oral BID PRN Gleason, Darcella Gasman, PA-C       enoxaparin (LOVENOX) injection 40 mg  40 mg Subcutaneous Q24H Chipper Herb, Ping T, MD   40 mg at 11/25/22 2100   feeding supplement (ENSURE ENLIVE / ENSURE PLUS) liquid 237 mL  237 mL Oral TID BM Icard, Bradley L, DO   237 mL at 11/26/22 1226   folic acid (FOLVITE) tablet 1 mg  1 mg Oral Daily Lonell Grandchild, MD   1 mg at 11/26/22 1038   guaiFENesin (MUCINEX) 12 hr tablet 1,200 mg  1,200 mg Oral BID Mikey College T, MD   1,200 mg at 11/26/22 1039   haloperidol lactate (HALDOL) injection 5 mg  5 mg Intravenous Q6H PRN Icard, Bradley L, DO   5 mg at 11/26/22 1349   insulin aspart (novoLOG) injection 0-5 Units  0-5 Units Subcutaneous QHS Dahal, Melina Schools, MD   3 Units at 11/25/22 2144   insulin aspart (novoLOG) injection 0-9 Units  0-9 Units Subcutaneous TID WC Dahal, Melina Schools, MD   2 Units at 11/26/22 1225   insulin glargine-yfgn (SEMGLEE) injection 8 Units  8 Units Subcutaneous Daily Desai, Rahul P, PA-C   8 Units at 11/26/22 1027   LORazepam (ATIVAN) injection 2-4 mg  2-4 mg Intravenous Q1H PRN Icard, Bradley L, DO   4 mg at 11/26/22 0800   metoprolol tartrate (LOPRESSOR) tablet 25 mg  25 mg Oral BID Lorin Glass, MD   25 mg at 11/25/22 2104   multivitamin with minerals tablet 1 tablet  1 tablet Oral BID Icard, Bradley L, DO   1  tablet at 11/26/22 1039   nicotine (NICODERM CQ - dosed in mg/24 hours) patch 21 mg  21 mg Transdermal Daily Mikey College T, MD   21 mg at 11/26/22 1038   OLANZapine zydis (ZYPREXA) disintegrating tablet 10 mg  10 mg Oral QHS Icard, Bradley L, DO   10 mg at 11/25/22 2100   Oral  care mouth rinse  15 mL Mouth Rinse 4 times per day Willeen Niece, MD   15 mL at 11/26/22 1226   Oral care mouth rinse  15 mL Mouth Rinse PRN Willeen Niece, MD       polyethylene glycol (MIRALAX / GLYCOLAX) packet 17 g  17 g Oral Daily PRN Gleason, Darcella Gasman, PA-C       revefenacin (YUPELRI) nebulizer solution 175 mcg  175 mcg Nebulization Daily Gleason, Darcella Gasman, PA-C   175 mcg at 11/26/22 0815   thiamine (VITAMIN B1) 500 mg in sodium chloride 0.9 % 50 mL IVPB  500 mg Intravenous Q24H Maryagnes Amos, FNP 100 mL/hr at 11/25/22 2101 500 mg at 11/25/22 2101   Vitamin D (Ergocalciferol) (DRISDOL) 1.25 MG (50000 UNIT) capsule 50,000 Units  50,000 Units Oral Q7 days Lorin Glass, MD   50,000 Units at 11/25/22 1737    Musculoskeletal:  Psychiatric Specialty Exam:Unable to assess due to sedation  Presentation  General Appearance:  Disheveled  Eye Contact: None  Speech: SLurred  Physical Exam: Physical Exam Vitals and nursing note reviewed.  Constitutional:      General: She is sleeping.     Appearance: Normal appearance.     Interventions: She is sedated and restrained.     Comments: Posey belt, received ativan   Pulmonary:     Effort: Tachypnea present.     Comments: Coughing after eating and drinking.  Skin:    General: Skin is warm and dry.     Capillary Refill: Capillary refill takes less than 2 seconds.  Neurological:     General: No focal deficit present.     Mental Status: Mental status is at baseline. She is disoriented.  Psychiatric:        Attention and Perception: She is inattentive.        Mood and Affect: Mood is anxious.        Speech: Speech is slurred.        Behavior: Behavior is slowed.        Thought Content: Thought content is not paranoid or delusional. Thought content does not include homicidal or suicidal ideation. Thought content does not include homicidal or suicidal plan.        Cognition and Memory: Cognition is impaired. Memory is impaired.       Review of Systems  Psychiatric/Behavioral:  Positive for substance abuse.   All other systems reviewed and are negative.  Blood pressure 104/66, pulse (!) 107, temperature 98.6 F (37 C), temperature source Oral, resp. rate (!) 27, height 5\' 2"  (1.575 m), weight 51 kg, last menstrual period 01/08/2011, SpO2 95 %. Body mass index is 20.56 kg/m.  Treatment Plan Summary: Daily contact with patient to assess and evaluate symptoms and progress in treatment, Medication management, and Plan    Continue olanzapine 10mg  po qhs.  -Continue agitation protocol Continue delirium precautions.  Continue Recruitment consultant for AMS/Psychosis. -Will continue with thiamine 500mg  IV x 3 days(1 day remaining).   -IVC to be continued.   -Continue posey belt restraint -->  Primary team to order for ongoing needs.  Continue CIWA and  Ativan protocol at this time. Recommend Symptom triggered vital signs scoring. Will update Ativan frequency, current LOS day 6 will be nearing window for alcohol withdraw related complications.   Labs reviewed and assessed. Patient with multiple abnormalities --> defer to primary team. Elevated Dimer (0.77). PCR +Influenza  EKG  11/24/22 -QTc 425 corrected Federica model.  Disposition:  Disposition is pending at this time. Patient is not medically stable to make final disposition. Psychiatry will continue to follow.      Maryagnes Amos, FNP 11/26/2022 3:08 PM

## 2022-11-26 NOTE — Progress Notes (Signed)
Speech Language Pathology Treatment: Dysphagia  Patient Details Name: [name] MRN: [id number] DOB: Aug 03, 1965 Today's Date: 11/26/2022 Time: 4098-1191 SLP Time Calculation (min) (ACUTE ONLY): 15 min  Assessment / Plan / Recommendation Clinical Impression  Nurse stated pt coughed during lunch. She also coughed when SLP arrived prior to any po's consumed. Pt drowsy (sedating meds this am) and had difficulty consistently coordinating sucking from a straw but was able to do intermittently. She consumed nectar thick liquids and chocolate pudding with intermittent coughing and wet vocal quality  During the MBS yesterday she had consistent penetration with thin and only once with nectar out of multiple trials. Difficult to determine if pt is having general congested coughs or airway intrusion with po's- her lethargy may be planning a role as well. Therapist will downgrade to honey thick to see if coughing decreases. If not will likely return to nectar thick. Continue puree when adequately alert and ST will continue intervention.   HPI HPI: [name] is a 57 y.o. female admitted 5/30 with SOB, AMS, COPD and ETOH withdrawal.  Drinks daily and cocaine postive on admit.  Postive Flu.  PMH significant for DM2, HTN, HLD, A-fib, COPD, schizophrenia, bipolar, anxiety, depression, chronic back pain, neuropathy, hypothyroidism polysubstance abuse (alcohol, tobacco, cocaine) and noncompliant to medications, oxygen or follow-ups. Noted to be coughing after meds. MBS with aspiration of thin 02/16/22 with nectar recommended and Dys 3      SLP Plan  Continue with current plan of care      Recommendations for follow up therapy are one component of a multi-disciplinary discharge planning process, led by the attending physician.  Recommendations may be updated based on patient status, additional functional criteria and insurance authorization.    Recommendations  Diet recommendations: honey thick ;Dysphagia 1  (puree) Liquids provided via: Cup;Straw Medication Administration: Whole meds with puree Supervision: Staff to assist with self feeding;Full supervision/cueing for compensatory strategies Compensations: Slow rate;Small sips/bites;Multiple dry swallows after each bite/sip Postural Changes and/or Swallow Maneuvers: Seated upright 90 degrees                  Oral care BID     Dysphagia, oropharyngeal phase (R13.12)     Continue with current plan of care     [name]  11/26/2022, 1:11 PM

## 2022-11-27 ENCOUNTER — Inpatient Hospital Stay (HOSPITAL_COMMUNITY): Payer: 59

## 2022-11-27 ENCOUNTER — Encounter (HOSPITAL_COMMUNITY): Payer: Self-pay | Admitting: Pulmonary Disease

## 2022-11-27 DIAGNOSIS — J449 Chronic obstructive pulmonary disease, unspecified: Secondary | ICD-10-CM

## 2022-11-27 DIAGNOSIS — E440 Moderate protein-calorie malnutrition: Secondary | ICD-10-CM

## 2022-11-27 DIAGNOSIS — J9602 Acute respiratory failure with hypercapnia: Secondary | ICD-10-CM

## 2022-11-27 DIAGNOSIS — G9340 Encephalopathy, unspecified: Secondary | ICD-10-CM | POA: Diagnosis not present

## 2022-11-27 DIAGNOSIS — J9601 Acute respiratory failure with hypoxia: Secondary | ICD-10-CM | POA: Diagnosis not present

## 2022-11-27 DIAGNOSIS — F10931 Alcohol use, unspecified with withdrawal delirium: Secondary | ICD-10-CM | POA: Diagnosis not present

## 2022-11-27 LAB — BASIC METABOLIC PANEL
Anion gap: 10 (ref 5–15)
BUN: 9 mg/dL (ref 6–20)
CO2: 33 mmol/L — ABNORMAL HIGH (ref 22–32)
Calcium: 8.8 mg/dL — ABNORMAL LOW (ref 8.9–10.3)
Chloride: 96 mmol/L — ABNORMAL LOW (ref 98–111)
Creatinine, Ser: 0.45 mg/dL (ref 0.44–1.00)
GFR, Estimated: >60 mL/min (ref >60)
Glucose, Bld: 140 mg/dL — ABNORMAL HIGH (ref 70–99)
Potassium: 4.4 mmol/L (ref 3.5–5.1)
Sodium: 139 mmol/L (ref 135–145)

## 2022-11-27 LAB — CBC
HCT: 39.2 % (ref 36.0–46.0)
Hemoglobin: 11.3 g/dL — ABNORMAL LOW (ref 12.0–15.0)
MCH: 22.4 pg — ABNORMAL LOW (ref 26.0–34.0)
MCHC: 28.8 g/dL — ABNORMAL LOW (ref 30.0–36.0)
MCV: 77.8 fL — ABNORMAL LOW (ref 80.0–100.0)
Platelets: 307 10*3/uL (ref 150–400)
RBC: 5.04 MIL/uL (ref 3.87–5.11)
RDW: 18.1 % — ABNORMAL HIGH (ref 11.5–15.5)
WBC: 9.7 10*3/uL (ref 4.0–10.5)
nRBC: 0 % (ref 0.0–0.2)

## 2022-11-27 LAB — GLUCOSE, CAPILLARY
Glucose-Capillary: 158 mg/dL — ABNORMAL HIGH (ref 70–99)
Glucose-Capillary: 258 mg/dL — ABNORMAL HIGH (ref 70–99)
Glucose-Capillary: 251 mg/dL — ABNORMAL HIGH (ref 70–99)
Glucose-Capillary: 100 mg/dL — ABNORMAL HIGH (ref 70–99)
Glucose-Capillary: 43 mg/dL — CL (ref 70–99)

## 2022-11-27 LAB — MAGNESIUM: Magnesium: 2.2 mg/dL (ref 1.7–2.4)

## 2022-11-27 LAB — PHOSPHORUS: Phosphorus: 4.1 mg/dL (ref 2.5–4.6)

## 2022-11-27 MED ORDER — SODIUM CHLORIDE 0.9 % IV SOLN
2.0000 g | INTRAVENOUS | Status: DC
  Administered 2022-11-27 – 2022-11-29 (×3): 2 g via INTRAVENOUS
  Filled 2022-11-27 (×5): qty 20

## 2022-11-27 MED ORDER — IPRATROPIUM-ALBUTEROL 0.5-2.5 (3) MG/3ML IN SOLN
3.0000 mL | Freq: Three times a day (TID) | RESPIRATORY_TRACT | Status: DC
  Administered 2022-11-27 – 2022-11-28 (×3): 3 mL via RESPIRATORY_TRACT
  Filled 2022-11-27 (×3): qty 3

## 2022-11-27 MED ORDER — DEXTROSE 50 % IV SOLN
INTRAVENOUS | Status: AC
  Administered 2022-11-27: 25 g via INTRAVENOUS
  Filled 2022-11-27: qty 50

## 2022-11-27 MED ORDER — METRONIDAZOLE 500 MG/100ML IV SOLN
500.0000 mg | Freq: Two times a day (BID) | INTRAVENOUS | Status: DC
  Administered 2022-11-27 – 2022-11-30 (×6): 500 mg via INTRAVENOUS
  Filled 2022-11-27 (×4): qty 100

## 2022-11-27 MED ORDER — DEXTROSE 50 % IV SOLN: 25.0000 g | INTRAVENOUS | Status: AC

## 2022-11-27 NOTE — Care Management Important Message (Signed)
Important Message  Patient Details  Name: [name] MRN: [id number] Date of Birth: 07/02/1965   Medicare Important Message Given:  Yes     [name] 11/27/2022, 1:36 PM

## 2022-11-27 NOTE — Consult Note (Addendum)
[facility] Face-to-Face Psychiatry Consult   Reason for Consult:   Aggressive Behavior  Referring Physician:  Oswald Hillock Patient Identification: [name] Principal Diagnosis: Acute respiratory failure with hypoxia and hypercapnia (HCC) Diagnosis:  Principal Problem:   Acute respiratory failure with hypoxia and hypercapnia (HCC) Active Problems:   COPD (chronic obstructive pulmonary disease) (HCC)   Encephalopathy   Alcohol withdrawal (HCC)   Withdrawal symptoms, alcohol (HCC)   Pneumonia   Malnutrition of moderate degree   Total Time spent with patient: 15 minutes  Subjective:   [name] is a 57 y.o. female was seen and evaluated face to face by this provider, who she recognizes from prior evals.  Patient seen and attempted to assess x 2, she continues to be unarousable with deep vigorous stimulation however remains difficult to stay awake and engage.  Her safety sitter patient has woken up to eat breakfast and immediately fall back to sleep.  She also reports patient received Haldol and Ativan today.  She reports rapid response was called due to tachycardia and tachypnea.  At the time of this evaluation patient's respirations ranged from 35-42, and she was observed to be at rest during this time.  The above findings were communicated to primary team.  Patient ativan requirements have decreased since starting symptom triggered CIWA, she is only received 2 mg of Ativan and 2 as needed doses of Haldol in 24 hours.  During the time of this assessment she remains in a Posey belt.  There appears to be no acute psychiatric concerns regarding this patient at this time, current concerns are vital signs and instability.  Rapid response team has been notified for sitter.  Suspect more of what we are seeing now is possibly delirium versus alcohol related withdrawal complications.   HPI: per admission assessment note:  [name] is a 57 y.o. female with medical history  significant of COPD with chronic hypoxic respiratory failure on 2 to 3 L as needed for activity at home, noncompliant with breathing medications, alcohol abuse,IIDM, chronic back pain and narcotic dependence, brought in by family member for evaluation of worsening of cough and shortness of breath.   Patient is sedated after given IV Ativan for-CIWA score unable to answer some of my questions.  She reported she has been running out of her oxygen for last 2 weeks as well as her COPD medications and started to have productive cough and shortness of breath for 7+ days denies any fever chills no chest pains.  She described the sputum as thick yellowish, she remembered had a temperature over the weekend of 101.0 but no chills.  Patient also reported daily drink of sixpacks beer every day and last drink was last night.  Past Psychiatric History " Polysubstance abuse, Depression, Schizoaffective;Bipolar type,  Previous dx's of schizophrenia, bipolar- Multiple inpatient stays (5) [facility], [facility]; last inpatient admission [facility] in 01/2022 after suicide attempt- Follows up at [facility], previously had ACT team - hx of suicide attempt x2 via overdose as a teen, 12/2021 by OD"   Risk to Self:  Denies, despite endorsing overdose on admission Risk to Others:   yes, threatened brother Prior Inpatient Therapy:   Recently 08/23 following serious suicide attempt of high lethality Prior Outpatient Therapy:  Denies  Past Medical History:  Past Medical History:  Diagnosis Date   Abdominal pain    Accidental drug overdose April 2013   Anxiety    Atrial fibrillation (HCC)  09/29/11   converted spontaneously   Chronic back pain    Chronic knee pain    Chronic nausea    Chronic pain    COPD (chronic obstructive pulmonary disease) (HCC)    Depression    Diabetes mellitus    states her doctor took her off all DM meds in past month   Diabetic neuropathy (HCC)    Dyspnea    with exertion    GERD  (gastroesophageal reflux disease)    Headache(784.0)    migraines    HTN (hypertension)    not on meds since in a year    Hyperlipidemia    Hypothyroidism    not on meds in a while    Mental disorder    Bipolar and schizophrenic   Requires supplemental oxygen    as needed per patient    Schizophrenia (HCC)    Schizophrenia, acute (HCC) 11/13/2017   Tobacco abuse     Past Surgical History:  Procedure Laterality Date   ABDOMINAL HYSTERECTOMY     BLADDER SUSPENSION  03/04/2011   Procedure: [facility] PROCEDURE;  Surgeon: Jonna Coup MacDiarmid;  Location: [facility];  Service: Urology;  Laterality: N/A;   BOWEL RESECTION N/A 04/18/2022   Procedure: SMALL BOWEL RESECTION;  Surgeon: Harriette Bouillon, MD;  Location: [facility] OR;  Service: General;  Laterality: N/A;   CYSTOCELE REPAIR  03/04/2011   Procedure: ANTERIOR REPAIR (CYSTOCELE);  Surgeon: Martina Sinner;  Location: [facility];  Service: Urology;  Laterality: N/A;   CYSTOSCOPY  03/04/2011   Procedure: CYSTOSCOPY;  Surgeon: Jonna Coup MacDiarmid;  Location: [facility];  Service: Urology;  Laterality: N/A;   ESOPHAGOGASTRODUODENOSCOPY (EGD) WITH PROPOFOL N/A 05/12/2017   Procedure: ESOPHAGOGASTRODUODENOSCOPY (EGD) WITH PROPOFOL;  Surgeon: Ovidio Kin, MD;  Location: [facility];  Service: General;  Laterality: N/A;   GASTRIC ROUX-EN-Y N/A 03/25/2016   Procedure: LAPAROSCOPIC ROUX-EN-Y GASTRIC BYPASS WITH UPPER ENDOSCOPY;  Surgeon: Glenna Fellows, MD;  Location: [facility];  Service: General;  Laterality: N/A;   KNEE SURGERY     LAPAROSCOPIC ASSISTED VAGINAL HYSTERECTOMY  03/04/2011   Procedure: LAPAROSCOPIC ASSISTED VAGINAL HYSTERECTOMY;  Surgeon: Jeani Hawking, MD;  Location: [facility];  Service: Gynecology;  Laterality: N/A;   LAPAROTOMY N/A 04/18/2022   Procedure: EXPLORATORY LAPAROTOMY;  Surgeon: Harriette Bouillon, MD;  Location: [facility];  Service: General;  Laterality: N/A;   LAPAROTOMY N/A 04/24/2022   Procedure: BRING BACK EXPLORATORY LAPAROTOMY;  Surgeon:  Violeta Gelinas, MD;  Location: [facility] OR;  Service: General;  Laterality: N/A;   TOTAL HIP ARTHROPLASTY Right 08/27/2022   Procedure: TOTAL HIP ARTHROPLASTY;  Surgeon: Joen Laura, MD;  Location: [facility];  Service: Orthopedics;  Laterality: Right;   Family History:  Family History  Problem Relation Age of Onset   Heart attack Father        22s   Diabetes Mother    Heart disease Mother    Hypertension Mother    Heart attack Sister        29   COPD Other    Breast cancer Neg Hx    Family Psychiatric  History:  Social History:  Social History   Substance and Sexual Activity  Alcohol Use Yes   Comment: 40 ounces daily     Social History   Substance and Sexual Activity  Drug Use Yes   Types: Cocaine, Marijuana, "Crack" cocaine   Comment: last use was about a week ago    Social History   Socioeconomic History  Marital status: Legally Separated    Spouse name: Not on file   Number of children: Not on file   Years of education: Not on file   Highest education level: Not on file  Occupational History   Not on file  Tobacco Use   Smoking status: Every Day    Packs/day: 1    Types: Cigarettes   Smokeless tobacco: Never  Vaping Use   Vaping Use: Never used  Substance and Sexual Activity   Alcohol use: Yes    Comment: 40 ounces daily   Drug use: Yes    Types: Cocaine, Marijuana, "Crack" cocaine    Comment: last use was about a week ago   Sexual activity: Yes    Partners: Male  Other Topics Concern   Not on file  Social History Narrative   Not on file   Social Determinants of Health   Financial Resource Strain: Not on file  Food Insecurity: No Food Insecurity (05/06/2022)   Hunger Vital Sign    Worried About Running Out of Food in the Last Year: Never true    Ran Out of Food in the Last Year: Never true  Transportation Needs: No Transportation Needs (05/06/2022)   PRAPARE - Administrator, Civil Service (Medical): No    Lack of Transportation  (Non-Medical): No  Physical Activity: Not on file  Stress: Not on file  Social Connections: Moderately Isolated (05/06/2022)   Social Connection and Isolation Panel [NHANES]    Frequency of Communication with Friends and Family: More than three times a week    Frequency of Social Gatherings with Friends and Family: Twice a week    Attends Religious Services: 1 to 4 times per year    Active Member of [employer] or Organizations: No    Attends Banker Meetings: Never    Marital Status: Separated   Additional Social History:    Allergies:   Allergies  Allergen Reactions   Iron Dextran Shortness Of Breath and Anxiety   Nsaids Other (See Comments)    H/o gastric bypass - can cause ulcers   Aspirin Nausea And Vomiting and Other (See Comments)    Ok to take tylenol or ibuprofen    Penicillins Other (See Comments)    Unknown. Childhood reaction     Labs:  Results for orders placed or performed during the hospital encounter of 11/20/22 (from the past 48 hour(s))  Glucose, capillary     Status: Abnormal   Collection Time: 11/25/22  4:11 PM  Result Value Ref Range   Glucose-Capillary 131 (H) 70 - 99 mg/dL    Comment: Glucose reference range applies only to samples taken after fasting for at least 8 hours.  Glucose, capillary     Status: Abnormal   Collection Time: 11/25/22  8:04 PM  Result Value Ref Range   Glucose-Capillary 190 (H) 70 - 99 mg/dL    Comment: Glucose reference range applies only to samples taken after fasting for at least 8 hours.  Glucose, capillary     Status: Abnormal   Collection Time: 11/25/22  9:43 PM  Result Value Ref Range   Glucose-Capillary 261 (H) 70 - 99 mg/dL    Comment: Glucose reference range applies only to samples taken after fasting for at least 8 hours.   Comment 1 Notify RN   Glucose, capillary     Status: None   Collection Time: 11/26/22  6:34 AM  Result Value Ref Range   Glucose-Capillary 83 70 -  99 mg/dL    Comment: Glucose  reference range applies only to samples taken after fasting for at least 8 hours.  Glucose, capillary     Status: Abnormal   Collection Time: 11/26/22  7:11 AM  Result Value Ref Range   Glucose-Capillary 100 (H) 70 - 99 mg/dL    Comment: Glucose reference range applies only to samples taken after fasting for at least 8 hours.  Glucose, capillary     Status: Abnormal   Collection Time: 11/26/22 10:49 AM  Result Value Ref Range   Glucose-Capillary 163 (H) 70 - 99 mg/dL    Comment: Glucose reference range applies only to samples taken after fasting for at least 8 hours.  Glucose, capillary     Status: Abnormal   Collection Time: 11/26/22  3:26 PM  Result Value Ref Range   Glucose-Capillary 129 (H) 70 - 99 mg/dL    Comment: Glucose reference range applies only to samples taken after fasting for at least 8 hours.  Glucose, capillary     Status: Abnormal   Collection Time: 11/26/22  8:18 PM  Result Value Ref Range   Glucose-Capillary 292 (H) 70 - 99 mg/dL    Comment: Glucose reference range applies only to samples taken after fasting for at least 8 hours.  Glucose, capillary     Status: Abnormal   Collection Time: 11/26/22 11:33 PM  Result Value Ref Range   Glucose-Capillary 164 (H) 70 - 99 mg/dL    Comment: Glucose reference range applies only to samples taken after fasting for at least 8 hours.  CBC     Status: Abnormal   Collection Time: 11/27/22  7:14 AM  Result Value Ref Range   WBC 9.7 4.0 - 10.5 K/uL   RBC 5.04 3.87 - 5.11 MIL/uL   Hemoglobin 11.3 (L) 12.0 - 15.0 g/dL   HCT 91.4 78.2 - 95.6 %   MCV 77.8 (L) 80.0 - 100.0 fL   MCH 22.4 (L) 26.0 - 34.0 pg   MCHC 28.8 (L) 30.0 - 36.0 g/dL   RDW 21.3 (H) 08.6 - 57.8 %   Platelets 307 150 - 400 K/uL   nRBC 0.0 0.0 - 0.2 %    Comment: Performed at [facility], [address], [place], [place] [address]  Magnesium     Status: None   Collection Time: 11/27/22  7:14 AM  Result Value Ref Range   Magnesium 2.2 1.7 - 2.4 mg/dL     Comment: Performed at [facility], [address], [place], [place] [address]  Basic metabolic panel     Status: Abnormal   Collection Time: 11/27/22  7:14 AM  Result Value Ref Range   Sodium 139 135 - 145 mmol/L   Potassium 4.4 3.5 - 5.1 mmol/L   Chloride 96 (L) 98 - 111 mmol/L   CO2 33 (H) 22 - 32 mmol/L   Glucose, Bld 140 (H) 70 - 99 mg/dL    Comment: Glucose reference range applies only to samples taken after fasting for at least 8 hours.   BUN 9 6 - 20 mg/dL   Creatinine, Ser 1.32 0.44 - 1.00 mg/dL   Calcium 8.8 (L) 8.9 - 10.3 mg/dL   GFR, Estimated >44 >01 mL/min    Comment: (NOTE) Calculated using the CKD-EPI Creatinine Equation (2021)    Anion gap 10 5 - 15    Comment: Performed at [facility], [address], [place], [place] [address]  Phosphorus  Status: None   Collection Time: 11/27/22  7:14 AM  Result Value Ref Range   Phosphorus 4.1 2.5 - 4.6 mg/dL    Comment: Performed at [facility], [address], [place], [place] [address]  Glucose, capillary     Status: Abnormal   Collection Time: 11/27/22  8:46 AM  Result Value Ref Range   Glucose-Capillary 158 (H) 70 - 99 mg/dL    Comment: Glucose reference range applies only to samples taken after fasting for at least 8 hours.  Glucose, capillary     Status: Abnormal   Collection Time: 11/27/22 11:42 AM  Result Value Ref Range   Glucose-Capillary 258 (H) 70 - 99 mg/dL    Comment: Glucose reference range applies only to samples taken after fasting for at least 8 hours.    Current Facility-Administered Medications  Medication Dose Route Frequency Provider Last Rate Last Admin   acetaminophen (TYLENOL) tablet 650 mg  650 mg Oral Q6H PRN Emeline General, MD   650 mg at 11/26/22 1958   Or   acetaminophen (TYLENOL) suppository 650 mg  650 mg Rectal Q6H PRN Mikey College T, MD       albuterol (PROVENTIL) (2.5 MG/3ML) 0.083% nebulizer solution 2.5 mg  2.5 mg Nebulization Q2H PRN Emeline General, MD        arformoterol [name]) nebulizer solution 15 mcg  15 mcg Nebulization BID Gleason, Darcella Gasman, PA-C   15 mcg at 11/27/22 6045   budesonide (PULMICORT) nebulizer solution 0.5 mg  0.5 mg Nebulization Q12H Dahal, Melina Schools, MD   0.5 mg at 11/27/22 4098   calcium carbonate (TUMS - dosed in mg elemental calcium) chewable tablet 200 mg of elemental calcium  1 tablet Oral TID Icard, Bradley L, DO   200 mg of elemental calcium at 11/27/22 0927   cefTRIAXone (ROCEPHIN) 2 g in sodium chloride 0.9 % 100 mL IVPB  2 g Intravenous Q24H Ghimire, Werner Lean, MD       Chlorhexidine Gluconate Cloth 2 % PADS 6 each  6 each Topical Daily Agarwala, Daleen Bo, MD   6 each at 11/27/22 0927   docusate sodium (COLACE) capsule 100 mg  100 mg Oral BID PRN Gleason, Darcella Gasman, PA-C       enoxaparin (LOVENOX) injection 40 mg  40 mg Subcutaneous Q24H Chipper Herb, Ping T, MD   40 mg at 11/26/22 1958   feeding supplement (ENSURE ENLIVE / ENSURE PLUS) liquid 237 mL  237 mL Oral TID BM Icard, Bradley L, DO   237 mL at 11/26/22 2200   folic acid (FOLVITE) tablet 1 mg  1 mg Oral Daily Lonell Grandchild, MD   1 mg at 11/27/22 0927   guaiFENesin (MUCINEX) 12 hr tablet 1,200 mg  1,200 mg Oral BID Mikey College T, MD   1,200 mg at 11/27/22 1191   haloperidol lactate (HALDOL) injection 5 mg  5 mg Intravenous Q6H PRN Icard, Bradley L, DO   5 mg at 11/26/22 2155   insulin aspart (novoLOG) injection 0-5 Units  0-5 Units Subcutaneous QHS Lorin Glass, MD   3 Units at 11/25/22 2144   insulin aspart (novoLOG) injection 0-9 Units  0-9 Units Subcutaneous TID WC Dahal, Melina Schools, MD   5 Units at 11/27/22 1204   insulin glargine-yfgn (SEMGLEE) injection 8 Units  8 Units Subcutaneous Daily Desai, Rahul P, PA-C   8 Units at 11/27/22 0937   ipratropium-albuterol (DUONEB) 0.5-2.5 (3) MG/3ML nebulizer solution 3 mL  3 mL Nebulization TID  Maretta Bees, MD   3 mL at 11/27/22 1509   LORazepam (ATIVAN) tablet 1-4 mg  1-4 mg Oral Q1H PRN Maryagnes Amos, FNP       Or    LORazepam (ATIVAN) injection 1-4 mg  1-4 mg Intravenous Q1H PRN Starkes-Perry, Juel Burrow, FNP       metoprolol tartrate (LOPRESSOR) tablet 25 mg  25 mg Oral BID Dahal, Melina Schools, MD   25 mg at 11/27/22 1914   metroNIDAZOLE (FLAGYL) IVPB 500 mg  500 mg Intravenous Q12H Maretta Bees, MD       multivitamin with minerals tablet 1 tablet  1 tablet Oral BID Icard, Bradley L, DO   1 tablet at 11/27/22 7829   nicotine (NICODERM CQ - dosed in mg/24 hours) patch 21 mg  21 mg Transdermal Daily Mikey College T, MD   21 mg at 11/27/22 0928   OLANZapine zydis (ZYPREXA) disintegrating tablet 10 mg  10 mg Oral QHS Icard, Bradley L, DO   10 mg at 11/25/22 2100   Oral care mouth rinse  15 mL Mouth Rinse 4 times per day Willeen Niece, MD   15 mL at 11/27/22 1205   Oral care mouth rinse  15 mL Mouth Rinse PRN Willeen Niece, MD       polyethylene glycol (MIRALAX / GLYCOLAX) packet 17 g  17 g Oral Daily PRN Gleason, Darcella Gasman, PA-C       revefenacin (YUPELRI) nebulizer solution 175 mcg  175 mcg Nebulization Daily Gleason, Darcella Gasman, PA-C   175 mcg at 11/27/22 5621   thiamine (VITAMIN B1) tablet 100 mg  100 mg Oral Daily Willeen Niece, MD   100 mg at 11/27/22 3086   Or   thiamine (VITAMIN B1) injection 100 mg  100 mg Intravenous Daily Willeen Niece, MD       Vitamin D (Ergocalciferol) (DRISDOL) 1.25 MG (50000 UNIT) capsule 50,000 Units  50,000 Units Oral Q7 days Lorin Glass, MD   50,000 Units at 11/25/22 1737    Musculoskeletal:  Psychiatric Specialty Exam:Unable to assess due to sedation  Presentation  General Appearance:  Disheveled  Eye Contact: None  Speech: SLurred  Physical Exam: Physical Exam Vitals and nursing note reviewed.  Constitutional:      General: She is sleeping.     Appearance: Normal appearance.     Interventions: She is sedated and restrained.     Comments: Posey belt, received ativan   Pulmonary:     Effort: Tachypnea present.     Comments: Coughing after eating and  drinking.  Skin:    General: Skin is warm and dry.     Capillary Refill: Capillary refill takes less than 2 seconds.  Neurological:     General: No focal deficit present.     Mental Status: Mental status is at baseline. She is disoriented.  Psychiatric:        Attention and Perception: She is inattentive.        Mood and Affect: Mood is anxious.        Speech: Speech is slurred.        Behavior: Behavior is slowed.        Thought Content: Thought content is not paranoid or delusional. Thought content does not include homicidal or suicidal ideation. Thought content does not include homicidal or suicidal plan.        Cognition and Memory: Cognition is impaired. Memory is impaired.      Review of Systems  Psychiatric/Behavioral:  Positive for substance abuse.   All other systems reviewed and are negative.  Blood pressure 103/64, pulse 96, temperature (!) 100.4 F (38 C), temperature source Axillary, resp. rate (!) 9, height 5\' 2"  (1.575 m), weight 54.1 kg, last menstrual period 01/08/2011, SpO2 97 %. Body mass index is 21.8 kg/m.  Treatment Plan Summary: Daily contact with patient to assess and evaluate symptoms and progress in treatment, Medication management, and Plan    Continue olanzapine 10mg  po qhs.  -Continue agitation protocol Continue delirium precautions.  Continue Recruitment consultant for AMS/Psychosis. Has completed thiamine IV in its entirety.  Will resume thiamine 100 mg p.o. daily.  -IVC to be continued, currently expires on June 10.  She is currently under IVC for medical purposes, as she lacks capacity and was attempting to leave before being medically stable.  -Continue posey belt restraint -->  Primary team to order for ongoing needs.  Continue CIWA and Ativan protocol at this time, for 2 additional days Recommend Symptom triggered vital signs scoring. Current LOS day 7 will be nearing window for alcohol withdraw related complications.   Labs reviewed and assessed.  Patient with multiple abnormalities --> defer to primary team. Elevated Dimer (0.77). PCR +Influenza  EKG  11/24/22 -QTc 425 corrected Federica model.   Psychiatric consult service to sign off at this time.  If patient condition changes or worsens; please reach out to provider on call or place new psychiatric consult.    Disposition: Psychiatric consult service has been following x 1 week.  Patients current presentation and daily evaluations, does not warrant inpatient psychiatric hospitalization.  It is felt patient is nearing her baseline and current infection (influenza and increased work of breathing ), alcohol use, ICU admission are likely contributing to prolonged delirium.  Once patient is medically stable, may rescind IVC.   Maryagnes Amos, FNP 11/27/2022 3:17 PM

## 2022-11-27 NOTE — Significant Event (Signed)
Rapid Response Event Note   Reason for Call :  Lethargy  Initial Focused Assessment:  Pt lying in bed, eyes closed. Non-sustained eye opening, however pt answers orientation questions appropriately. Moving all extremities equally. Following commands. Skin warm, dry, tenting. Mucous membranes dry. Lung sounds clear. Heart rate regular, no adventitious heart sounds.  VS: T 100.41F, BP 104/64, HR 115, RR 34, SpO2 92% on 6LNC CBG: 258  Interventions:  No intervention from RRT  Plan of Care:  -VS per MEWS protocol -Encourage oral intake, consider IVF if oral intake is low -Oral care per protocol -Document I&Os  Event Summary:  MD Notified: Dr. Jerral Ralph Call Time: 1036 Arrival Time: 1040 End Time: 1100  Jennye Moccasin, RN

## 2022-11-27 NOTE — TOC Progression Note (Signed)
Transition of Care [facility]) - Progression Note    Patient Details  Name: [name] MRN: [id number] Date of Birth: [birth year]  [facility] CM/SW Contact  Mearl Latin, LCSW Phone Number: 11/27/2022, 4:28 PM  Clinical Narrative:    Patient transferred to 5W. TOC following for needs. Daughter, [name], updated.    Expected Discharge Plan: Home w Home Health Services Barriers to Discharge: Continued Medical Work up  Expected Discharge Plan and Services In-house Referral: NA Discharge Planning Services: CM Consult   Living arrangements for the past 2 months: Single Family Home                                       Social Determinants of Health (SDOH) Interventions SDOH Screenings   Food Insecurity: No Food Insecurity (05/06/2022)  Housing: Low Risk  (05/06/2022)  Transportation Needs: No Transportation Needs (05/06/2022)  Utilities: Not At Risk (05/06/2022)  Alcohol Screen: Medium Risk (08/04/2018)  Social Connections: Moderately Isolated (05/06/2022)  Tobacco Use: High Risk (11/27/2022)    Readmission Risk Interventions    11/21/2022    1:16 PM 04/28/2022    2:18 PM 04/07/2022   12:41 PM  Readmission Risk Prevention Plan  Transportation Screening Complete Complete Complete  HRI or Home Care Consult   Complete  Social Work Consult for Recovery Care Planning/Counseling   Complete  Palliative Care Screening   Not Applicable  Medication Review Oceanographer) Referral to Pharmacy Complete Complete  PCP or Specialist appointment within 3-5 days of discharge  Complete   HRI or Home Care Consult Complete Complete   SW Recovery Care/Counseling Consult  Complete   Palliative Care Screening Not Applicable Not Applicable   Skilled Nursing Facility Not Applicable Not Applicable

## 2022-11-27 NOTE — Progress Notes (Signed)
Speech Language Pathology Treatment: Dysphagia  Patient Details Name: [name] MRN: [id number] DOB: 08-28-1965 Today's Date: 11/27/2022 Time: 1415-1440 SLP Time Calculation (min) (ACUTE ONLY): 25 min  Assessment / Plan / Recommendation Clinical Impression  Patient seen by SLP for skilled treatment focused on dysphagia goals. When SLP initially tried to see her this morning, patient was asleep and unable to awaken. Currently (2:15pm) patient is awake, alert, just finished session with PTA and is sitting in recliner chair. She is requesting "drink". Prior to this, her voice sounded somewhat congested. SLP inspected oral cavity and thick, wet secretions observed in oropharynx which were suctioned out.  SLP then assessed patient's toleration with cup sips of nectar thick liquids and honey thick liquids. She exhibited an instance of delayed cough, however she appeared to tolerate nectar thick liquids better than honey thick, likely secondary to her ability to clear suspected pharyngeal residuals. Her RR fluctuated throughout session, as high as 42, as low as 22 but this did not appear to correlate with PO intake. She tolerated puree PO's without observed difficulty aside from 1-2 spontaneous extra swallows performed. She would drink from cup rapidly if allowed, taking large sips then requesting another drink immediately after. She consumed total of 16 ounces of thickened liquids, and two magic cup supplements. SLP recommending change to nectar thick liquids, continue with puree solids. SLP will follow for toleration and ability to advance.   HPI HPI: [name] is a 57 y.o. female admitted 5/30 with SOB, AMS, COPD and ETOH withdrawal.  Drinks daily and cocaine postive on admit.  Postive Flu.  PMH significant for DM2, HTN, HLD, A-fib, COPD, schizophrenia, bipolar, anxiety, depression, chronic back pain, neuropathy, hypothyroidism polysubstance abuse (alcohol, tobacco, cocaine) and noncompliant to  medications, oxygen or follow-ups. Noted to be coughing after meds. MBS with aspiration of thin 02/16/22 with nectar recommended and Dys 3      SLP Plan  Continue with current plan of care      Recommendations for follow up therapy are one component of a multi-disciplinary discharge planning process, led by the attending physician.  Recommendations may be updated based on patient status, additional functional criteria and insurance authorization.    Recommendations  Diet recommendations: Dysphagia 1 (puree);Nectar-thick liquid Liquids provided via: Cup Medication Administration: Whole meds with puree Supervision: Staff to assist with self feeding;Full supervision/cueing for compensatory strategies Compensations: Slow rate;Small sips/bites;Multiple dry swallows after each bite/sip;Minimize environmental distractions Postural Changes and/or Swallow Maneuvers: Seated upright 90 degrees                  Oral care BID   Frequent or constant Supervision/Assistance Dysphagia, oropharyngeal phase (R13.12)     Continue with current plan of care     Angela Nevin, MA, CCC-SLP Speech Therapy
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
## 2022-11-27 NOTE — Progress Notes (Signed)
Physical Therapy Treatment Patient Details Name: [name] MRN: [id number] DOB: 31-Jul-1965 Today's Date: 11/27/2022   History of Present Illness [name] is a 57 y.o. female admitted 5/30 with SOB, AMS, COPD and ETOH withdrawal.  Drinks daily and cocaine postive on admit.  Postive Flu.   PMH significant for DM2, HTN, HLD, A-fib, COPD, schizophrenia, bipolar, anxiety, depression, chronic back pain, neuropathy, hypothyroidism polysubstance abuse (alcohol, tobacco, cocaine) and noncompliant to medications, oxygen or follow-ups.    PT Comments    Pt greeted up in chair on arrival, restless and eager for mobility. Pt continues to demonstrate impulsivity, attempting to stand before this PTA ready and RW placed in front on pt. Pt requiring grossly min guard for functional transfers and balance during gait with RW for support, however pt needing mod A to manage RW and negotiate around obstacles. Pt becoming agitated once in hall, unable to be redirected to continue gait, with pt impulsively turning around back into room. Pt continues to be limited by impaired cognition, impulsivity, and significantly decreased insight into current deficits. Pt continues to benefit from skilled PT services to progress toward functional mobility goals.    Recommendations for follow up therapy are one component of a multi-disciplinary discharge planning process, led by the attending physician.  Recommendations may be updated based on patient status, additional functional criteria and insurance authorization.  Follow Up Recommendations       Assistance Recommended at Discharge Intermittent Supervision/Assistance  Patient can return home with the following A little help with walking and/or transfers;A little help with bathing/dressing/bathroom;Assistance with cooking/housework;Assist for transportation;Help with stairs or ramp for entrance   Equipment Recommendations  None recommended by PT    Recommendations  for Other Services       Precautions / Restrictions Precautions Precautions: Fall Restrictions Weight Bearing Restrictions: No     Mobility  Bed Mobility Overal bed mobility: Needs Assistance             General bed mobility comments: pt up in chair on arrival    Transfers Overall transfer level: Needs assistance Equipment used: Rolling walker (2 wheels) Transfers: Sit to/from Stand Sit to Stand: Min guard           General transfer comment: close guard for safety, no physical assist needed x5 during session    Ambulation/Gait Ambulation/Gait assistance: Mod assist Gait Distance (Feet): 40 Feet Assistive device: Rolling walker (2 wheels) Gait Pattern/deviations: Step-through pattern, Decreased stride length, Trunk flexed Gait velocity: variable     General Gait Details: pt impulsive with gait needing mod assist to direct RW with pt running into all obstacles in room; computer, bed, sink, door frame, equally R and L. pt becoming agitated once in hall stating "I want to go sit down" unable to be redirected to continue gait, pt impulsively turning around back into room   Stairs             Wheelchair Mobility    Modified Rankin (Stroke Patients Only)       Balance Overall balance assessment: Needs assistance Sitting-balance support: No upper extremity supported, Feet supported Sitting balance-Leahy Scale: Fair Sitting balance - Comments: able to sit without UE support   Standing balance support: Bilateral upper extremity supported, During functional activity, Reliant on assistive device for balance Standing balance-Leahy Scale: Fair Standing balance comment: Rw for gait  Cognition Arousal/Alertness: Awake/alert Behavior During Therapy: Flat affect Overall Cognitive Status: Impaired/Different from baseline Area of Impairment: Orientation, Memory, Safety/judgement, Following commands, Awareness                  Orientation Level: Time, Situation, Place   Memory: Decreased short-term memory, Decreased recall of precautions Following Commands: Follows one step commands with increased time Safety/Judgement: Decreased awareness of safety, Decreased awareness of deficits Awareness: Intellectual   General Comments: pt agitated throughout session, wanting to stand up from chair, but then once ambulating stating "i want to sit down" but then standing back up immediately once seated back in chair        Exercises      General Comments        Pertinent Vitals/Pain Pain Assessment Pain Assessment: Faces Faces Pain Scale: Hurts little more Pain Location: discomfort with posey waist restraint Pain Descriptors / Indicators: Other (Comment) (restless) Pain Intervention(s): Monitored during session, Repositioned    Home Living                          Prior Function            PT Goals (current goals can now be found in the care plan section) Acute Rehab PT Goals PT Goal Formulation: With patient Time For Goal Achievement: 12/07/22 Progress towards PT goals: Not progressing toward goals - comment (agitation)    Frequency    Min 1X/week      PT Plan      Co-evaluation              AM-PAC PT "6 Clicks" Mobility   Outcome Measure  Help needed turning from your back to your side while in a flat bed without using bedrails?: A Little Help needed moving from lying on your back to sitting on the side of a flat bed without using bedrails?: A Little Help needed moving to and from a bed to a chair (including a wheelchair)?: A Little Help needed standing up from a chair using your arms (e.g., wheelchair or bedside chair)?: A Little Help needed to walk in hospital room?: A Little Help needed climbing 3-5 steps with a railing? : A Lot 6 Click Score: 17    End of Session Equipment Utilized During Treatment: Gait belt Activity Tolerance: Treatment limited secondary  to agitation Patient left: in chair;with call bell/phone within reach;with chair alarm set;with nursing/sitter in room;Other (comment) (with SLP present) Nurse Communication: Mobility status PT Visit Diagnosis: Muscle weakness (generalized) (M62.81);Other abnormalities of gait and mobility (R26.89)     Time: 6295-2841 PT Time Calculation (min) (ACUTE ONLY): 14 min  Charges:  $Gait Training: 8-22 mins                     Noemie Devivo R. PTA Acute Rehabilitation Services Office: [phone]   Catalina Antigua 11/27/2022, 2:36 PM

## 2022-11-27 NOTE — Progress Notes (Signed)
   11/27/22 0846  Assess: MEWS Score  Temp 98.2 F (36.8 C)  BP 112/65  MAP (mmHg) 78  Pulse Rate (!) 108  ECG Heart Rate (!) 108  Resp (!) 35  SpO2 95 %  O2 Device Nasal Cannula  Assess: MEWS Score  MEWS Temp 0  MEWS Systolic 0  MEWS Pulse 1  MEWS RR 2  MEWS LOC 1  MEWS Score 4  MEWS Score Color Red  Assess: if the MEWS score is Yellow or Red  Were vital signs taken at a resting state? Yes  Focused Assessment No change from prior assessment  Does the patient meet 2 or more of the SIRS criteria? Yes  Does the patient have a confirmed or suspected source of infection? Yes  MEWS guidelines implemented  Yes, red  Treat  MEWS Interventions Considered administering scheduled or prn medications/treatments as ordered  Take Vital Signs  Increase Vital Sign Frequency  Red: Q1hr x2, continue Q4hrs until patient remains green for 12hrs  Escalate  MEWS: Escalate Red: Discuss with charge nurse and notify provider. Consider notifying RRT. If remains red for 2 hours consider need for higher level of care  Notify: Charge Nurse/RN  Name of Charge Nurse/RN Notified Teacher, adult education  Provider Notification  Provider Name/Title Dr. Jerral Ralph  Date Provider Notified 11/27/22  Time Provider Notified [phone]  Method of Notification Face-to-face  Notification Reason Other (Comment) (red mews)  Provider response No new orders  Date of Provider Response 11/27/22  Time of Provider Response 0846  Assess: SIRS CRITERIA  SIRS Temperature  0  SIRS Pulse 1  SIRS Respirations  1  SIRS WBC 0  SIRS Score Sum  2

## 2022-11-28 DIAGNOSIS — F10931 Alcohol use, unspecified with withdrawal delirium: Secondary | ICD-10-CM | POA: Diagnosis not present

## 2022-11-28 DIAGNOSIS — E440 Moderate protein-calorie malnutrition: Secondary | ICD-10-CM | POA: Diagnosis not present

## 2022-11-28 DIAGNOSIS — J449 Chronic obstructive pulmonary disease, unspecified: Secondary | ICD-10-CM | POA: Diagnosis not present

## 2022-11-28 DIAGNOSIS — J9601 Acute respiratory failure with hypoxia: Secondary | ICD-10-CM | POA: Diagnosis not present

## 2022-11-28 LAB — PROCALCITONIN: Procalcitonin: 0.11 ng/mL

## 2022-11-28 LAB — MAGNESIUM: Magnesium: 2 mg/dL (ref 1.7–2.4)

## 2022-11-28 LAB — COMPREHENSIVE METABOLIC PANEL
ALT: 15 U/L (ref 0–44)
AST: 18 U/L (ref 15–41)
Albumin: 2.8 g/dL — ABNORMAL LOW (ref 3.5–5.0)
Alkaline Phosphatase: 96 U/L (ref 38–126)
Anion gap: 10 (ref 5–15)
BUN: 8 mg/dL (ref 6–20)
CO2: 32 mmol/L (ref 22–32)
Calcium: 8.7 mg/dL — ABNORMAL LOW (ref 8.9–10.3)
Chloride: 96 mmol/L — ABNORMAL LOW (ref 98–111)
Creatinine, Ser: 0.56 mg/dL (ref 0.44–1.00)
GFR, Estimated: >60 mL/min (ref >60)
Glucose, Bld: 129 mg/dL — ABNORMAL HIGH (ref 70–99)
Potassium: 3.8 mmol/L (ref 3.5–5.1)
Sodium: 138 mmol/L (ref 135–145)
Total Bilirubin: 0.2 mg/dL — ABNORMAL LOW (ref 0.3–1.2)
Total Protein: 7.3 g/dL (ref 6.5–8.1)

## 2022-11-28 LAB — AMMONIA: Ammonia: 14 umol/L (ref 9–35)

## 2022-11-28 LAB — BRAIN NATRIURETIC PEPTIDE: B Natriuretic Peptide: 63.9 pg/mL (ref 0.0–100.0)

## 2022-11-28 LAB — CBC
HCT: 37.7 % (ref 36.0–46.0)
Hemoglobin: 10.7 g/dL — ABNORMAL LOW (ref 12.0–15.0)
MCH: 22 pg — ABNORMAL LOW (ref 26.0–34.0)
MCHC: 28.4 g/dL — ABNORMAL LOW (ref 30.0–36.0)
MCV: 77.6 fL — ABNORMAL LOW (ref 80.0–100.0)
Platelets: 332 10*3/uL (ref 150–400)
RBC: 4.86 MIL/uL (ref 3.87–5.11)
RDW: 17.8 % — ABNORMAL HIGH (ref 11.5–15.5)
WBC: 12.1 10*3/uL — ABNORMAL HIGH (ref 4.0–10.5)
nRBC: 0 % (ref 0.0–0.2)

## 2022-11-28 LAB — GLUCOSE, CAPILLARY
Glucose-Capillary: 130 mg/dL — ABNORMAL HIGH (ref 70–99)
Glucose-Capillary: 187 mg/dL — ABNORMAL HIGH (ref 70–99)
Glucose-Capillary: 117 mg/dL — ABNORMAL HIGH (ref 70–99)
Glucose-Capillary: 181 mg/dL — ABNORMAL HIGH (ref 70–99)
Glucose-Capillary: 242 mg/dL — ABNORMAL HIGH (ref 70–99)

## 2022-11-28 LAB — CULTURE, BLOOD (ROUTINE X 2): Special Requests: ADEQUATE

## 2022-11-28 MED ORDER — OXYCODONE HCL 5 MG PO TABS
5.0000 mg | ORAL_TABLET | Freq: Four times a day (QID) | ORAL | Status: DC | PRN
  Administered 2022-11-28 – 2022-12-01 (×8): 5 mg via ORAL
  Filled 2022-11-28 (×8): qty 1

## 2022-11-28 MED ORDER — IPRATROPIUM-ALBUTEROL 0.5-2.5 (3) MG/3ML IN SOLN
3.0000 mL | Freq: Two times a day (BID) | RESPIRATORY_TRACT | Status: DC
  Administered 2022-11-28 – 2022-11-29 (×2): 3 mL via RESPIRATORY_TRACT
  Filled 2022-11-28: qty 3

## 2022-11-28 MED ORDER — IPRATROPIUM-ALBUTEROL 0.5-2.5 (3) MG/3ML IN SOLN
3.0000 mL | Freq: Two times a day (BID) | RESPIRATORY_TRACT | Status: DC
  Filled 2022-11-28: qty 3

## 2022-11-28 MED ORDER — HALOPERIDOL LACTATE 5 MG/ML IJ SOLN
2.0000 mg | Freq: Four times a day (QID) | INTRAMUSCULAR | Status: DC | PRN
  Filled 2022-11-28: qty 1

## 2022-11-28 MED ORDER — NEPRO/CARBSTEADY PO LIQD: 237.0000 mL | Freq: Three times a day (TID) | ORAL | Status: DC | PRN
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
## 2022-11-28 NOTE — Progress Notes (Signed)
Speech Language Pathology Treatment: Dysphagia  Patient Details Name: [name] MRN: [id number] DOB: 1965/08/19 Today's Date: 11/28/2022 Time: 7829-5621 SLP Time Calculation (min) (ACUTE ONLY): 13 min  Assessment / Plan / Recommendation Clinical Impression  Pt's sitter reported she had no coughing or difficulty with breakfast until the end with her grits when she did cough. On arrival she was sleeping but awakened with verbal and tactile cues. Pt did not cough before or during trials of nectar thick via straw or applesauce and no congestion detected. Intermittently pt has a baseline cough that is not necessarily related to her po consumption. Today she appeared to tolerate consistencies well and it is recommended that she continue Dys 1 (puree) consistency and nectar thick liquids when adequately alert. Crush meds in puree. ST will follow.    HPI HPI: [name] is a 57 y.o. female admitted 5/30 with SOB, AMS, COPD and ETOH withdrawal.  Drinks daily and cocaine postive on admit.  Postive Flu.  PMH significant for DM2, HTN, HLD, A-fib, COPD, schizophrenia, bipolar, anxiety, depression, chronic back pain, neuropathy, hypothyroidism polysubstance abuse (alcohol, tobacco, cocaine) and noncompliant to medications, oxygen or follow-ups. Noted to be coughing after meds. MBS with aspiration of thin 02/16/22 with nectar recommended and Dys 3      SLP Plan  Continue with current plan of care      Recommendations for follow up therapy are one component of a multi-disciplinary discharge planning process, led by the attending physician.  Recommendations may be updated based on patient status, additional functional criteria and insurance authorization.    Recommendations  Diet recommendations: Dysphagia 1 (puree);Nectar-thick liquid Liquids provided via: Cup;Straw Medication Administration: Crushed with puree Supervision: Staff to assist with self feeding;Full supervision/cueing for compensatory  strategies Compensations: Slow rate;Small sips/bites;Multiple dry swallows after each bite/sip;Minimize environmental distractions Postural Changes and/or Swallow Maneuvers: Seated upright 90 degrees                  Oral care BID   Frequent or constant Supervision/Assistance Dysphagia, oropharyngeal phase (R13.12)     Continue with current plan of care     [name]  11/28/2022, 10:01 AM

## 2022-11-28 NOTE — Progress Notes (Signed)
Physical Therapy Treatment Patient Details Name: [name] MRN: [id number] DOB: 02/13/1966 Today's Date: 11/28/2022   History of Present Illness [name] is a 57 y.o. female admitted 5/30 with SOB, AMS, COPD and ETOH withdrawal.  Drinks daily and cocaine postive on admit.  Postive Flu.   PMH significant for DM2, HTN, HLD, A-fib, COPD, schizophrenia, bipolar, anxiety, depression, chronic back pain, neuropathy, hypothyroidism polysubstance abuse (alcohol, tobacco, cocaine) and noncompliant to medications, oxygen or follow-ups.    PT Comments    PT greeted resting in bed and eager for mobility with continued progress towards acute goals. Pt demonstrating some mild impulsivity this session, however pt able to be redirected and following all commands appropriately.  Pt requiring grossly min guard for transfers and gait with RW support with intermittent min A during gait for anticipatory cues to avoid obstacles in hall. Pt daughter arriving at end of session and supportive and pt pleasant and participatory throughout. Current plan remains appropriate pending continued cognitive improvement. Pt continues to benefit from skilled PT services to progress toward functional mobility goals.    Recommendations for follow up therapy are one component of a multi-disciplinary discharge planning process, led by the attending physician.  Recommendations may be updated based on patient status, additional functional criteria and insurance authorization.  Follow Up Recommendations       Assistance Recommended at Discharge Intermittent Supervision/Assistance  Patient can return home with the following A little help with walking and/or transfers;A little help with bathing/dressing/bathroom;Assistance with cooking/housework;Assist for transportation;Help with stairs or ramp for entrance   Equipment Recommendations  None recommended by PT    Recommendations for Other Services       Precautions /  Restrictions Precautions Precautions: Fall Precaution Comments: DRoplet Restrictions Weight Bearing Restrictions: No     Mobility  Bed Mobility Overal bed mobility: Needs Assistance Bed Mobility: Supine to Sit, Sit to Supine     Supine to sit: Min assist Sit to supine: Supervision   General bed mobility comments: light min A to elevate trunk    Transfers Overall transfer level: Needs assistance Equipment used: Rolling walker (2 wheels) Transfers: Sit to/from Stand Sit to Stand: Min guard           General transfer comment: min guard for safety    Ambulation/Gait Ambulation/Gait assistance: Min guard, Min assist Gait Distance (Feet): 175 Feet Assistive device: Rolling walker (2 wheels) Gait Pattern/deviations: Step-through pattern, Decreased stride length, Trunk flexed Gait velocity: variable     General Gait Details: min guard grossly up to min A to negotiatoe around obstacles, pt able to anticipate and navigate around obstacles with cues.   Stairs             Wheelchair Mobility    Modified Rankin (Stroke Patients Only)       Balance Overall balance assessment: Needs assistance Sitting-balance support: No upper extremity supported, Feet supported Sitting balance-Leahy Scale: Fair Sitting balance - Comments: able to sit without UE support   Standing balance support: Bilateral upper extremity supported, During functional activity, Reliant on assistive device for balance Standing balance-Leahy Scale: Fair Standing balance comment: Rw for gait                            Cognition Arousal/Alertness: Awake/alert Behavior During Therapy: Flat affect Overall Cognitive Status: Impaired/Different from baseline Area of Impairment: Memory, Safety/judgement, Following commands, Awareness  Memory: Decreased short-term memory, Decreased recall of precautions Following Commands: Follows one step commands  consistently Safety/Judgement: Decreased awareness of safety, Decreased awareness of deficits Awareness: Intellectual   General Comments: pt with improved cognition this session, mildlly impulsive but also eager for OOB mobilty, receptive to all cues to wait and slow down        Exercises      General Comments        Pertinent Vitals/Pain Pain Assessment Pain Assessment: No/denies pain    Home Living                          Prior Function            PT Goals (current goals can now be found in the care plan section) Acute Rehab PT Goals Patient Stated Goal: to go home PT Goal Formulation: With patient Time For Goal Achievement: 12/07/22 Progress towards PT goals: Progressing toward goals    Frequency    Min 1X/week      PT Plan      Co-evaluation              AM-PAC PT "6 Clicks" Mobility   Outcome Measure  Help needed turning from your back to your side while in a flat bed without using bedrails?: A Little Help needed moving from lying on your back to sitting on the side of a flat bed without using bedrails?: A Little Help needed moving to and from a bed to a chair (including a wheelchair)?: A Little Help needed standing up from a chair using your arms (e.g., wheelchair or bedside chair)?: A Little Help needed to walk in hospital room?: A Little Help needed climbing 3-5 steps with a railing? : A Lot 6 Click Score: 17    End of Session Equipment Utilized During Treatment: Gait belt Activity Tolerance: Treatment limited secondary to agitation Patient left: with call bell/phone within reach;in bed;with bed alarm set;with nursing/sitter in room Nurse Communication: Mobility status PT Visit Diagnosis: Muscle weakness (generalized) (M62.81);Other abnormalities of gait and mobility (R26.89)     Time: 1610-9604 PT Time Calculation (min) (ACUTE ONLY): 29 min  Charges:  $Gait Training: 8-22 mins $Therapeutic Activity: 8-22 mins                      Jalynn Betzold R. PTA Acute Rehabilitation Services Office: [phone]   Catalina Antigua 11/28/2022, 3:49 PM
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
## 2022-11-29 DIAGNOSIS — F10931 Alcohol use, unspecified with withdrawal delirium: Secondary | ICD-10-CM | POA: Diagnosis not present

## 2022-11-29 DIAGNOSIS — G9340 Encephalopathy, unspecified: Secondary | ICD-10-CM | POA: Diagnosis not present

## 2022-11-29 LAB — BASIC METABOLIC PANEL
Anion gap: 8 (ref 5–15)
BUN: 11 mg/dL (ref 6–20)
CO2: 34 mmol/L — ABNORMAL HIGH (ref 22–32)
Calcium: 8.3 mg/dL — ABNORMAL LOW (ref 8.9–10.3)
Chloride: 98 mmol/L (ref 98–111)
Creatinine, Ser: 0.56 mg/dL (ref 0.44–1.00)
GFR, Estimated: >60 mL/min (ref >60)
Glucose, Bld: 84 mg/dL (ref 70–99)
Potassium: 3.5 mmol/L (ref 3.5–5.1)
Sodium: 140 mmol/L (ref 135–145)

## 2022-11-29 LAB — CBC
HCT: 32.2 % — ABNORMAL LOW (ref 36.0–46.0)
Hemoglobin: 9.6 g/dL — ABNORMAL LOW (ref 12.0–15.0)
MCH: 23 pg — ABNORMAL LOW (ref 26.0–34.0)
MCHC: 29.8 g/dL — ABNORMAL LOW (ref 30.0–36.0)
MCV: 77.2 fL — ABNORMAL LOW (ref 80.0–100.0)
Platelets: 311 10*3/uL (ref 150–400)
RBC: 4.17 MIL/uL (ref 3.87–5.11)
RDW: 17.7 % — ABNORMAL HIGH (ref 11.5–15.5)
WBC: 9.9 10*3/uL (ref 4.0–10.5)
nRBC: 0 % (ref 0.0–0.2)

## 2022-11-29 LAB — GLUCOSE, CAPILLARY
Glucose-Capillary: 344 mg/dL — ABNORMAL HIGH (ref 70–99)
Glucose-Capillary: 284 mg/dL — ABNORMAL HIGH (ref 70–99)
Glucose-Capillary: 124 mg/dL — ABNORMAL HIGH (ref 70–99)
Glucose-Capillary: 174 mg/dL — ABNORMAL HIGH (ref 70–99)

## 2022-11-29 LAB — CULTURE, BLOOD (ROUTINE X 2)
Culture: NO GROWTH
Culture: NO GROWTH

## 2022-11-29 MED ORDER — INSULIN GLARGINE-YFGN 100 UNIT/ML ~~LOC~~ SOLN
8.0000 [IU] | Freq: Every day | SUBCUTANEOUS | Status: DC
  Administered 2022-11-29 – 2022-12-01 (×3): 8 [IU] via SUBCUTANEOUS
  Filled 2022-11-29 (×3): qty 0.08

## 2022-11-29 MED ORDER — IPRATROPIUM-ALBUTEROL 0.5-2.5 (3) MG/3ML IN SOLN
3.0000 mL | Freq: Two times a day (BID) | RESPIRATORY_TRACT | Status: DC
  Administered 2022-11-29 – 2022-12-01 (×3): 3 mL via RESPIRATORY_TRACT
  Filled 2022-11-29 (×4): qty 3

## 2022-11-29 MED ORDER — SALINE SPRAY 0.65 % NA SOLN
1.0000 | NASAL | Status: DC | PRN
  Administered 2022-11-30: 1 via NASAL
  Filled 2022-11-29: qty 44

## 2022-11-29 MED ORDER — ONDANSETRON HCL 4 MG/2ML IJ SOLN
4.0000 mg | Freq: Four times a day (QID) | INTRAMUSCULAR | Status: DC | PRN
  Administered 2022-11-29: 4 mg via INTRAVENOUS
  Filled 2022-11-29: qty 2
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
## 2022-11-30 DIAGNOSIS — F10931 Alcohol use, unspecified with withdrawal delirium: Secondary | ICD-10-CM | POA: Diagnosis not present

## 2022-11-30 DIAGNOSIS — G9340 Encephalopathy, unspecified: Secondary | ICD-10-CM | POA: Diagnosis not present

## 2022-11-30 LAB — BASIC METABOLIC PANEL
Anion gap: 8 (ref 5–15)
BUN: 5 mg/dL — ABNORMAL LOW (ref 6–20)
CO2: 30 mmol/L (ref 22–32)
Calcium: 8.2 mg/dL — ABNORMAL LOW (ref 8.9–10.3)
Chloride: 99 mmol/L (ref 98–111)
Creatinine, Ser: 0.49 mg/dL (ref 0.44–1.00)
GFR, Estimated: >60 mL/min (ref >60)
Glucose, Bld: 124 mg/dL — ABNORMAL HIGH (ref 70–99)
Potassium: 3.7 mmol/L (ref 3.5–5.1)
Sodium: 137 mmol/L (ref 135–145)

## 2022-11-30 LAB — GLUCOSE, CAPILLARY
Glucose-Capillary: 218 mg/dL — ABNORMAL HIGH (ref 70–99)
Glucose-Capillary: 145 mg/dL — ABNORMAL HIGH (ref 70–99)
Glucose-Capillary: 234 mg/dL — ABNORMAL HIGH (ref 70–99)
Glucose-Capillary: 211 mg/dL — ABNORMAL HIGH (ref 70–99)

## 2022-11-30 NOTE — Plan of Care (Signed)
  Problem: Education: Goal: Knowledge of disease or condition will improve Outcome: Progressing Goal: Knowledge of the prescribed therapeutic regimen will improve Outcome: Progressing Goal: Individualized Educational Video(s) Outcome: Progressing   Problem: Activity: Goal: Ability to tolerate increased activity will improve Outcome: Progressing Goal: Will verbalize the importance of balancing activity with adequate rest periods Outcome: Progressing   Problem: Respiratory: Goal: Ability to maintain a clear airway will improve Outcome: Progressing Goal: Levels of oxygenation will improve Outcome: Progressing Goal: Ability to maintain adequate ventilation will improve Outcome: Progressing   Problem: Education: Goal: Ability to describe self-care measures that may prevent or decrease complications (Diabetes Survival Skills Education) will improve Outcome: Progressing Goal: Individualized Educational Video(s) Outcome: Progressing   Problem: Coping: Goal: Ability to adjust to condition or change in health will improve Outcome: Progressing   Problem: Fluid Volume: Goal: Ability to maintain a balanced intake and output will improve Outcome: Progressing   Problem: Health Behavior/Discharge Planning: Goal: Ability to identify and utilize available resources and services will improve Outcome: Progressing Goal: Ability to manage health-related needs will improve Outcome: Progressing   Problem: Metabolic: Goal: Ability to maintain appropriate glucose levels will improve Outcome: Progressing   Problem: Nutritional: Goal: Maintenance of adequate nutrition will improve Outcome: Progressing Goal: Progress toward achieving an optimal weight will improve Outcome: Progressing   Problem: Skin Integrity: Goal: Risk for impaired skin integrity will decrease Outcome: Progressing   Problem: Tissue Perfusion: Goal: Adequacy of tissue perfusion will improve Outcome: Progressing    Problem: Education: Goal: Knowledge of General Education information will improve Description: Including pain rating scale, medication(s)/side effects and non-pharmacologic comfort measures Outcome: Progressing   Problem: Health Behavior/Discharge Planning: Goal: Ability to manage health-related needs will improve Outcome: Progressing   Problem: Clinical Measurements: Goal: Ability to maintain clinical measurements within normal limits will improve Outcome: Progressing Goal: Will remain free from infection Outcome: Progressing Goal: Diagnostic test results will improve Outcome: Progressing Goal: Respiratory complications will improve Outcome: Progressing Goal: Cardiovascular complication will be avoided Outcome: Progressing   Problem: Activity: Goal: Risk for activity intolerance will decrease Outcome: Progressing   Problem: Nutrition: Goal: Adequate nutrition will be maintained Outcome: Progressing   Problem: Coping: Goal: Level of anxiety will decrease Outcome: Progressing   Problem: Elimination: Goal: Will not experience complications related to bowel motility Outcome: Progressing Goal: Will not experience complications related to urinary retention Outcome: Progressing   Problem: Pain Managment: Goal: General experience of comfort will improve Outcome: Progressing   Problem: Safety: Goal: Ability to remain free from injury will improve Outcome: Progressing   Problem: Skin Integrity: Goal: Risk for impaired skin integrity will decrease Outcome: Progressing   Problem: Safety: Goal: Non-violent Restraint(s) Outcome: Progressing

## 2022-11-30 NOTE — Progress Notes (Signed)
Mobility Specialist Progress Note:   11/30/22 1430  Mobility  Activity Ambulated with assistance in hallway  Level of Assistance Standby assist, set-up cues, supervision of patient - no hands on  Assistive Device None  Distance Ambulated (ft) 500 ft  Activity Response Tolerated well  Mobility Referral Yes  $Mobility charge 1 Mobility  Mobility Specialist Start Time (ACUTE ONLY) 1430  Mobility Specialist Stop Time (ACUTE ONLY) 1450  Mobility Specialist Time Calculation (min) (ACUTE ONLY) 20 min   Pt requesting to ambulate in hallway. No physical assistance throughout. C/o back pain with exertion, RN gave oxy. Pt back in room with all needs met, sitter present.   Addison Lank Mobility Specialist Please contact via SecureChat or  Rehab office at [phone]'
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
## 2022-12-01 ENCOUNTER — Other Ambulatory Visit (HOSPITAL_COMMUNITY): Payer: Self-pay

## 2022-12-01 ENCOUNTER — Inpatient Hospital Stay (HOSPITAL_COMMUNITY): Payer: 59

## 2022-12-01 DIAGNOSIS — G9340 Encephalopathy, unspecified: Secondary | ICD-10-CM | POA: Diagnosis not present

## 2022-12-01 DIAGNOSIS — J449 Chronic obstructive pulmonary disease, unspecified: Secondary | ICD-10-CM | POA: Diagnosis not present

## 2022-12-01 DIAGNOSIS — F10931 Alcohol use, unspecified with withdrawal delirium: Secondary | ICD-10-CM | POA: Diagnosis not present

## 2022-12-01 DIAGNOSIS — J9601 Acute respiratory failure with hypoxia: Secondary | ICD-10-CM | POA: Diagnosis not present

## 2022-12-01 LAB — GLUCOSE, CAPILLARY
Glucose-Capillary: 141 mg/dL — ABNORMAL HIGH (ref 70–99)
Glucose-Capillary: 268 mg/dL — ABNORMAL HIGH (ref 70–99)

## 2022-12-01 LAB — CULTURE, BLOOD (ROUTINE X 2): Special Requests: ADEQUATE

## 2022-12-01 MED ORDER — CEFPODOXIME PROXETIL 200 MG PO TABS
200.0000 mg | ORAL_TABLET | Freq: Two times a day (BID) | ORAL | 0 refills | Status: AC
  Filled 2022-12-01: qty 4, 2d supply, fill #0

## 2022-12-01 MED ORDER — TIOTROPIUM BROMIDE MONOHYDRATE 18 MCG IN CAPS
18.0000 ug | ORAL_CAPSULE | Freq: Every day | RESPIRATORY_TRACT | 2 refills | Status: DC
  Filled 2022-12-01: qty 30, 30d supply, fill #0

## 2022-12-01 MED ORDER — METFORMIN HCL 500 MG PO TABS
500.0000 mg | ORAL_TABLET | Freq: Two times a day (BID) | ORAL | 1 refills | Status: DC
  Filled 2022-12-01: qty 60, 30d supply, fill #0

## 2022-12-01 MED ORDER — ALBUTEROL SULFATE HFA 108 (90 BASE) MCG/ACT IN AERS
2.0000 | INHALATION_SPRAY | RESPIRATORY_TRACT | 0 refills | Status: DC | PRN
  Filled 2022-12-01: qty 18, 30d supply, fill #0

## 2022-12-01 MED ORDER — BUDESONIDE-FORMOTEROL FUMARATE 160-4.5 MCG/ACT IN AERO
2.0000 | INHALATION_SPRAY | Freq: Two times a day (BID) | RESPIRATORY_TRACT | 2 refills | Status: DC
  Filled 2022-12-01: qty 10.2, 30d supply, fill #0

## 2022-12-01 MED ORDER — THIAMINE HCL 100 MG PO TABS
100.0000 mg | ORAL_TABLET | Freq: Every day | ORAL | 0 refills | Status: DC
  Filled 2022-12-01: qty 30, 30d supply, fill #0

## 2022-12-01 MED ORDER — OLANZAPINE 10 MG PO TABS
10.0000 mg | ORAL_TABLET | Freq: Every day | ORAL | 1 refills | Status: DC
  Filled 2022-12-01: qty 30, 30d supply, fill #0

## 2022-12-01 MED ORDER — METOPROLOL TARTRATE 25 MG PO TABS
25.0000 mg | ORAL_TABLET | Freq: Two times a day (BID) | ORAL | 1 refills | Status: DC
  Filled 2022-12-01: qty 60, 30d supply, fill #0

## 2022-12-01 NOTE — Discharge Summary (Signed)
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 evaluation of pulmonary embolism. There are no filling defects in the central, lobar, segmental or subsegmental pulmonary artery branches to suggest acute pulmonary embolism. Main pulmonary artery measures 3.5 cm, unchanged. Normal heart size. No significant pericardial fluid/thickening. Coronary artery calcifications and aortic atherosclerosis. Mediastinum/Nodes: Imaged thyroid gland without nodules meeting criteria for imaging follow-up by size. Small hiatal hernia. No pathologically enlarged axillary, supraclavicular, mediastinal, or hilar lymph nodes. Lungs/Pleura: The central airways are patent. Diffuse peribronchial wall thickening with subsegmental mucous plugging in the left lower lobe. Scattered peribronchovascular ground-glass densities throughout. Upper lobe predominant paraseptal and centrilobular emphysema. No pneumothorax. No pleural effusion. Upper abdomen: Postsurgical changes of the stomach. Musculoskeletal: Increased sclerotic appearance of the manubrium. Unchanged compression of T9. Review of the MIP images confirms the above findings. IMPRESSION: 1. No evidence of pulmonary embolism. 2. Diffuse peribronchial wall thickening  with subsegmental mucous plugging in the left lower lobe in keeping with bronchitis. Scattered peribronchovascular ground-glass densities throughout, likely infectious or inflammatory. 3. Unchanged dilation of the main pulmonary artery, which can be seen in the setting of pulmonary arterial hypertension. 4. Increased sclerosis of the manubrium, likely reflecting healing fracture. 5. Aortic Atherosclerosis (ICD10-I70.0) and Emphysema (ICD10-J43.9). Coronary artery calcifications. Assessment for potential risk factor modification, dietary therapy or pharmacologic therapy may be warranted, if clinically indicated. Electronically Signed   By: Agustin Cree M.D.   On: 11/20/2022 13:58   DG Chest Portable 1 View  Result Date: 11/20/2022 CLINICAL DATA:  Shortness of breath EXAM: PORTABLE CHEST 1 VIEW COMPARISON:  X-ray 08/25/2022 FINDINGS: No consolidation, pneumothorax or effusion. No edema. Normal cardiopericardial silhouette. Overlapping cardiac leads. IMPRESSION: No acute cardiopulmonary disease. Electronically Signed   By: Karen Kays M.D.   On: 11/20/2022 10:44     TODAY-DAY OF DISCHARGE:  Subjective:   [name] today has no headache,no chest abdominal pain,no new weakness tingling or numbness, feels much better wants to go home today.   Objective:   Blood pressure (!) 143/80, pulse 98, temperature 98.5 F (36.9 C), temperature source Oral, resp. rate 18, height 5\' 2"  (1.575 m), weight 51.8 kg, last menstrual period 01/08/2011, SpO2 96 %.  Intake/Output Summary (Last 24 hours) at 12/01/2022 0852 Last data filed at 12/01/2022 0802 Gross per 24 hour  Intake 840 ml  Output --  Net 840 ml   Filed Weights   11/28/22 0300 11/29/22 0500 11/30/22 0703  Weight: 53.4 kg 53 kg 51.8 kg    Exam: Awake Alert, Oriented *3, No new  F.N deficits, Normal affect [place].AT,PERRAL Supple Neck,No JVD, No cervical lymphadenopathy appriciated.  Symmetrical Chest wall movement, Good air movement bilaterally,  CTAB RRR,No Gallops,Rubs or new Murmurs, No Parasternal Heave +ve B.Sounds, Abd Soft, Non tender, No organomegaly appriciated, No rebound -guarding or rigidity. No Cyanosis, Clubbing or edema, No new Rash or bruise   PERTINENT RADIOLOGIC STUDIES: No results found.   PERTINENT LAB RESULTS: CBC: Recent Labs    11/29/22 0115  WBC 9.9  HGB 9.6*  HCT 32.2*  PLT 311   CMET CMP     Component Value Date/Time   NA 137 11/30/2022 0347   K 3.7 11/30/2022 0347   CL 99 11/30/2022 0347   CO2 30 11/30/2022 0347   GLUCOSE 124 (H) 11/30/2022 0347   BUN 5 (L) 11/30/2022 0347   CREATININE 0.49 11/30/2022 0347   CREATININE 0.57 03/28/2021 0000   CALCIUM 8.2 (L) 11/30/2022 0347   PROT 7.3 11/28/2022 0645   ALBUMIN 2.8 (L) 11/28/2022 0645   AST 18 11/28/2022 0645   ALT 15 11/28/2022 0645   ALKPHOS 96 11/28/2022 0645   BILITOT 0.2 (L) 11/28/2022 0645   GFR 87.26 11/04/2011 0911   EGFR 107 03/28/2021 0000   GFRNONAA >60 11/30/2022 0347    GFR Estimated Creatinine Clearance: 60.6 mL/min (by C-G formula based on SCr of 0.49 mg/dL). No results for input(s): "LIPASE", "AMYLASE" in the last 72 hours. No results for input(s): "CKTOTAL", "CKMB", "CKMBINDEX", "TROPONINI" in the last 72 hours. Invalid input(s): "POCBNP" No results for input(s): "DDIMER" in the last 72 hours. No results for input(s): "HGBA1C" in the last 72 hours. No results for input(s): "CHOL", "HDL", "LDLCALC", "TRIG", "CHOLHDL", "LDLDIRECT" in the last 72 hours. No results for input(s): "TSH", "T4TOTAL", "T3FREE", "THYROIDAB" in the last 72 hours.  Invalid input(s): "FREET3" No results for input(s): "VITAMINB12", "FOLATE", "FERRITIN", "TIBC", "IRON", "RETICCTPCT" in the last 72 hours. Coags: No results for input(s): "INR" in the last 72 hours.  Invalid input(s): "PT" Microbiology: Recent Results (from the past 240 hour(s))  Culture, blood (Routine X 2) w Reflex to ID Panel     Status: None (Preliminary result)    Collection Time: 11/28/22 12:21 PM   Specimen: BLOOD  Result Value Ref Range Status   Specimen Description BLOOD SITE NOT SPECIFIED  Final   Special Requests   Final    BOTTLES DRAWN AEROBIC AND ANAEROBIC Blood Culture adequate volume   Culture   Final    NO GROWTH 3 DAYS Performed at [facility], [address], [place], [place] [address]    Report Status PENDING  Incomplete  Culture, blood (Routine X 2) w Reflex to ID Panel     Status: None (Preliminary result)   Collection Time: 11/28/22  1:02 PM   Specimen: BLOOD  Result Value Ref Range Status   Specimen Description BLOOD SITE NOT SPECIFIED  Final   Special Requests   Final    BOTTLES DRAWN AEROBIC ONLY Blood Culture adequate volume   Culture   Final    NO GROWTH 3 DAYS Performed at [facility], [address] [place], [place] [address]    Report Status PENDING  Incomplete    FURTHER DISCHARGE INSTRUCTIONS:  Get Medicines reviewed and adjusted: Please take all your medications with you for your next visit with your Primary MD  Laboratory/radiological data: Please request your Primary MD to go over all hospital tests and procedure/radiological results at the follow up, please ask your Primary MD to  get all Hospital records sent to his/her office.  In some cases, they will be blood work, cultures and biopsy results pending at the time of your discharge. Please request that your primary care M.D. goes through all the records of your hospital data and follows up on these results.  Also Note the following: If you experience worsening of your admission symptoms, develop shortness of breath, life threatening emergency, suicidal or homicidal thoughts you must seek medical attention immediately by calling 911 or calling your MD immediately  if symptoms less severe.  You must read complete instructions/literature along with all the possible adverse reactions/side effects for all the Medicines you take and that have been  prescribed to you. Take any new Medicines after you have completely understood and accpet all the possible adverse reactions/side effects.   Do not drive when taking Pain medications or sleeping medications (Benzodaizepines)  Do not take more than prescribed Pain, Sleep and Anxiety Medications. It is not advisable to combine anxiety,sleep and pain medications without talking with your primary care practitioner  Special Instructions: If you have smoked or chewed Tobacco  in the last 2 yrs please stop smoking, stop any regular Alcohol  and or any Recreational drug use.  Wear Seat belts while driving.  Please note: You were cared for by a hospitalist during your hospital stay. Once you are discharged, your primary care physician will handle any further medical issues. Please note that NO REFILLS for any discharge medications will be authorized once you are discharged, as it is imperative that you return to your primary care physician (or establish a relationship with a primary care physician if you do not have one) for your post hospital discharge needs so that they can reassess your need for medications and monitor your lab values.  Total Time spent coordinating discharge including counseling, education and face to face time equals greater than 30 minutes.  SignedJeoffrey Massed 12/01/2022 8:52 AM

## 2022-12-01 NOTE — Progress Notes (Signed)
Speech Language Pathology Treatment: Dysphagia  Patient Details Name: [name] MRN: [id number] DOB: [birth year] Today's Date: 12/01/2022 Time: 2130-8657 SLP Time Calculation (min) (ACUTE ONLY): 15 min  Assessment / Plan / Recommendation Clinical Impression  Pt was seen for dysphagia treatment. Her diet was advanced to regular texture solids and thin liquids this morning and SLP was contacted by MD and RN regarding pt's improvement in mentation with question of whether the MBS (scheduled for today at 0900) is still warranted. Pt was seen ambulating in room with sitter present. She was pleasant and cooperative during the session. She tolerated regular texture solids and thin liquids via cup and straw without overt s/s of aspiration. Mastication was mildly prolonged secondary to poor dental status of molars, but it was functional and oral clearance was adequate. No symptoms of pharyngeal residue were demonstrated. SLP is in agreement with diet advancement to regular texture solids and thin liquids and the MBS will be deferred at this time. SLP will follow briefly to ensure tolerance.    HPI HPI: [name] is a 57 y.o. female admitted 5/30 with SOB, AMS, COPD and ETOH withdrawal.  Drinks daily and cocaine postive on admit.  Postive Flu.  PMH significant for DM2, HTN, HLD, A-fib, COPD, schizophrenia, bipolar, anxiety, depression, chronic back pain, neuropathy, hypothyroidism polysubstance abuse (alcohol, tobacco, cocaine) and noncompliant to medications, oxygen or follow-ups. Noted to be coughing after meds. MBS with aspiration of thin 02/16/22 with nectar recommended and Dys 3      SLP Plan  Continue with current plan of care      Recommendations for follow up therapy are one component of a multi-disciplinary discharge planning process, led by the attending physician.  Recommendations may be updated based on patient status, additional functional criteria and insurance authorization.     Recommendations  Diet recommendations: Regular;Thin liquid Liquids provided via: Cup;Straw Medication Administration: Whole meds with liquid Supervision: Staff to assist with self feeding;Full supervision/cueing for compensatory strategies Compensations: Small sips/bites;Slow rate Postural Changes and/or Swallow Maneuvers: Seated upright 90 degrees                  Oral care BID   Frequent or constant Supervision/Assistance Dysphagia, oropharyngeal phase (R13.12)     Continue with current plan of care   Severino Paolo I. Vear Clock, MS, CCC-SLP Neuro Diagnostic Specialist  Acute Rehabilitation Services Office number: [phone]  Scheryl Marten  12/01/2022, 8:50 AM

## 2022-12-01 NOTE — Progress Notes (Signed)
Patient walked out of room accompanied by sitter to request a 'can soda'. Pt refused any other form of diet soda/ alternate beverage and states "my doctor told me I can have whatever I want". Pt educated on ordered diet and blood glucose management. Pt then proceeded to yell at staff  stating we cannot tell her what to do and that she is leaving. Patient then found unit exit door to try and get on an elevator to leave. Patient kept yelling "Im going home you can't tell me what to do". Patient continued to yell profanity very loudly at staff while she was asked to please make her way back to her room for further discussion. Patient was unable to be verbally de escalated and security was called and arrived to help escort patient back to room. Patient continued to yell at staff and once in her room proceeded to throw several objects at staff including food, cups and water jugs while demanding a soda. Due to staff safety being in jeopardy patient was confined to room while MD was notified and situation was deescalated. Dr. Jerral Ralph as well as covering MD Dr. Loney Loh made aware of the event.

## 2022-12-01 NOTE — TOC Transition Note (Signed)
Transition of Care [facility]) - CM/SW Discharge Note   Patient Details  Name: [name] MRN: [id number] Date of Birth: [birth year]  [facility] CM/SW Contact:  Gordy Clement, RN Phone Number: 12/01/2022, 12:46 PM   Clinical Narrative:     Patient o DC tpo home today  A cane was requested just prior to DC.  [employer] will be delivering to [employer] for Daughter to pick up and bring to patient       Barriers to Discharge: Continued Medical Work up   Patient Goals and CMS Choice      Discharge Placement                         Discharge Plan and Services Additional resources added to the After Visit Summary for   In-house Referral: NA Discharge Planning Services: CM Consult                                 Social Determinants of Health (SDOH) Interventions SDOH Screenings   Food Insecurity: No Food Insecurity (05/06/2022)  Housing: Low Risk  (05/06/2022)  Transportation Needs: No Transportation Needs (05/06/2022)  Utilities: Not At Risk (05/06/2022)  Alcohol Screen: Medium Risk (08/04/2018)  Social Connections: Moderately Isolated (05/06/2022)  Tobacco Use: High Risk (11/27/2022)     Readmission Risk Interventions    11/21/2022    1:16 PM 04/28/2022    2:18 PM 04/07/2022   12:41 PM  Readmission Risk Prevention Plan  Transportation Screening Complete Complete Complete  HRI or Home Care Consult   Complete  Social Work Consult for Recovery Care Planning/Counseling   Complete  Palliative Care Screening   Not Applicable  Medication Review [employer]) Referral to Pharmacy Complete Complete  PCP or Specialist appointment within 3-5 days of discharge  Complete   HRI or Home Care Consult Complete Complete   SW Recovery Care/Counseling Consult  Complete   Palliative Care Screening Not Applicable Not Applicable   Skilled Nursing Facility Not Applicable Not Applicable

## 2022-12-01 NOTE — Progress Notes (Signed)
Pt calmed down shortly after agitation episode this morning, has been pleasant and cooperative since then. Pt is very alert and restless, pacing around room continuously, but has taken several walks in the hall w/ sitter which pt says has helped relieve some anxiety. No elopement attempts or other issues noted while walking pt around the unit.

## 2022-12-01 NOTE — Plan of Care (Signed)
  Problem: Education: Goal: Knowledge of disease or condition will improve Outcome: Progressing Goal: Knowledge of the prescribed therapeutic regimen will improve Outcome: Progressing Goal: Individualized Educational Video(s) Outcome: Progressing   Problem: Activity: Goal: Ability to tolerate increased activity will improve Outcome: Progressing Goal: Will verbalize the importance of balancing activity with adequate rest periods Outcome: Progressing   Problem: Respiratory: Goal: Ability to maintain a clear airway will improve Outcome: Progressing Goal: Levels of oxygenation will improve Outcome: Progressing Goal: Ability to maintain adequate ventilation will improve Outcome: Progressing   Problem: Education: Goal: Ability to describe self-care measures that may prevent or decrease complications (Diabetes Survival Skills Education) will improve Outcome: Progressing Goal: Individualized Educational Video(s) Outcome: Progressing   Problem: Coping: Goal: Ability to adjust to condition or change in health will improve Outcome: Progressing   Problem: Fluid Volume: Goal: Ability to maintain a balanced intake and output will improve Outcome: Progressing   Problem: Health Behavior/Discharge Planning: Goal: Ability to identify and utilize available resources and services will improve Outcome: Progressing Goal: Ability to manage health-related needs will improve Outcome: Progressing   Problem: Metabolic: Goal: Ability to maintain appropriate glucose levels will improve Outcome: Progressing   Problem: Nutritional: Goal: Maintenance of adequate nutrition will improve Outcome: Progressing Goal: Progress toward achieving an optimal weight will improve Outcome: Progressing   Problem: Skin Integrity: Goal: Risk for impaired skin integrity will decrease Outcome: Progressing   Problem: Tissue Perfusion: Goal: Adequacy of tissue perfusion will improve Outcome: Progressing    Problem: Education: Goal: Knowledge of General Education information will improve Description: Including pain rating scale, medication(s)/side effects and non-pharmacologic comfort measures Outcome: Progressing   Problem: Health Behavior/Discharge Planning: Goal: Ability to manage health-related needs will improve Outcome: Progressing   Problem: Clinical Measurements: Goal: Ability to maintain clinical measurements within normal limits will improve Outcome: Progressing Goal: Will remain free from infection Outcome: Progressing Goal: Diagnostic test results will improve Outcome: Progressing Goal: Respiratory complications will improve Outcome: Progressing Goal: Cardiovascular complication will be avoided Outcome: Progressing   Problem: Activity: Goal: Risk for activity intolerance will decrease Outcome: Progressing   Problem: Nutrition: Goal: Adequate nutrition will be maintained Outcome: Progressing   Problem: Coping: Goal: Level of anxiety will decrease Outcome: Progressing   Problem: Elimination: Goal: Will not experience complications related to bowel motility Outcome: Progressing Goal: Will not experience complications related to urinary retention Outcome: Progressing   Problem: Pain Managment: Goal: General experience of comfort will improve Outcome: Progressing   Problem: Safety: Goal: Ability to remain free from injury will improve Outcome: Progressing   Problem: Skin Integrity: Goal: Risk for impaired skin integrity will decrease Outcome: Progressing   Problem: Safety: Goal: Non-violent Restraint(s) Outcome: Progressing

## 2022-12-02 LAB — CULTURE, BLOOD (ROUTINE X 2)

## 2022-12-03 LAB — CULTURE, BLOOD (ROUTINE X 2)

## 2022-12-05 ENCOUNTER — Emergency Department (HOSPITAL_COMMUNITY): Payer: 59

## 2022-12-05 ENCOUNTER — Emergency Department (HOSPITAL_COMMUNITY)
Admission: EM | Admit: 2022-12-05 | Discharge: 2022-12-05 | Discharge status: Against Medical Advice | Payer: 59 | Attending: Emergency Medicine | Admitting: Emergency Medicine

## 2022-12-05 ENCOUNTER — Other Ambulatory Visit: Payer: Self-pay

## 2022-12-05 DIAGNOSIS — R0602 Shortness of breath: Secondary | ICD-10-CM | POA: Diagnosis not present

## 2022-12-05 DIAGNOSIS — Z7951 Long term (current) use of inhaled steroids: Secondary | ICD-10-CM | POA: Insufficient documentation

## 2022-12-05 DIAGNOSIS — M79604 Pain in right leg: Secondary | ICD-10-CM | POA: Diagnosis not present

## 2022-12-05 DIAGNOSIS — J441 Chronic obstructive pulmonary disease with (acute) exacerbation: Secondary | ICD-10-CM | POA: Diagnosis not present

## 2022-12-05 DIAGNOSIS — R069 Unspecified abnormalities of breathing: Secondary | ICD-10-CM | POA: Diagnosis not present

## 2022-12-05 DIAGNOSIS — R0789 Other chest pain: Secondary | ICD-10-CM | POA: Diagnosis not present

## 2022-12-05 DIAGNOSIS — R000 Tachycardia, unspecified: Secondary | ICD-10-CM | POA: Insufficient documentation

## 2022-12-05 DIAGNOSIS — M549 Dorsalgia, unspecified: Secondary | ICD-10-CM | POA: Diagnosis not present

## 2022-12-05 DIAGNOSIS — Z885 Allergy status to narcotic agent status: Secondary | ICD-10-CM | POA: Diagnosis not present

## 2022-12-05 DIAGNOSIS — D649 Anemia, unspecified: Secondary | ICD-10-CM | POA: Insufficient documentation

## 2022-12-05 DIAGNOSIS — Z7982 Long term (current) use of aspirin: Secondary | ICD-10-CM | POA: Diagnosis not present

## 2022-12-05 DIAGNOSIS — J189 Pneumonia, unspecified organism: Secondary | ICD-10-CM

## 2022-12-05 DIAGNOSIS — R0902 Hypoxemia: Secondary | ICD-10-CM | POA: Diagnosis not present

## 2022-12-05 DIAGNOSIS — Z7902 Long term (current) use of antithrombotics/antiplatelets: Secondary | ICD-10-CM | POA: Diagnosis not present

## 2022-12-05 DIAGNOSIS — Z7984 Long term (current) use of oral hypoglycemic drugs: Secondary | ICD-10-CM | POA: Insufficient documentation

## 2022-12-05 DIAGNOSIS — F17210 Nicotine dependence, cigarettes, uncomplicated: Secondary | ICD-10-CM | POA: Diagnosis not present

## 2022-12-05 DIAGNOSIS — Z7901 Long term (current) use of anticoagulants: Secondary | ICD-10-CM | POA: Diagnosis not present

## 2022-12-05 DIAGNOSIS — R079 Chest pain, unspecified: Secondary | ICD-10-CM | POA: Diagnosis not present

## 2022-12-05 DIAGNOSIS — J988 Other specified respiratory disorders: Secondary | ICD-10-CM | POA: Diagnosis not present

## 2022-12-05 DIAGNOSIS — Z886 Allergy status to analgesic agent status: Secondary | ICD-10-CM | POA: Diagnosis not present

## 2022-12-05 DIAGNOSIS — Z79899 Other long term (current) drug therapy: Secondary | ICD-10-CM | POA: Insufficient documentation

## 2022-12-05 DIAGNOSIS — R739 Hyperglycemia, unspecified: Secondary | ICD-10-CM | POA: Diagnosis not present

## 2022-12-05 DIAGNOSIS — Z880 Allergy status to penicillin: Secondary | ICD-10-CM | POA: Diagnosis not present

## 2022-12-05 LAB — COMPREHENSIVE METABOLIC PANEL
ALT: 24 U/L (ref 0–44)
AST: 39 U/L (ref 15–41)
Albumin: 2.7 g/dL — ABNORMAL LOW (ref 3.5–5.0)
Alkaline Phosphatase: 88 U/L (ref 38–126)
Anion gap: 11 (ref 5–15)
BUN: <5 mg/dL — ABNORMAL LOW (ref 6–20)
CO2: 20 mmol/L — ABNORMAL LOW (ref 22–32)
Calcium: 8 mg/dL — ABNORMAL LOW (ref 8.9–10.3)
Chloride: 106 mmol/L (ref 98–111)
Creatinine, Ser: 0.56 mg/dL (ref 0.44–1.00)
GFR, Estimated: >60 mL/min (ref >60)
Glucose, Bld: 236 mg/dL — ABNORMAL HIGH (ref 70–99)
Potassium: 4 mmol/L (ref 3.5–5.1)
Sodium: 137 mmol/L (ref 135–145)
Total Bilirubin: 0.4 mg/dL (ref 0.3–1.2)
Total Protein: 6.4 g/dL — ABNORMAL LOW (ref 6.5–8.1)

## 2022-12-05 LAB — CBC WITH DIFFERENTIAL/PLATELET
Abs Immature Granulocytes: 0.03 10*3/uL (ref 0.00–0.07)
Basophils Absolute: 0 10*3/uL (ref 0.0–0.1)
Basophils Relative: 1 %
Eosinophils Absolute: 0.1 10*3/uL (ref 0.0–0.5)
Eosinophils Relative: 2 %
HCT: 33.6 % — ABNORMAL LOW (ref 36.0–46.0)
Hemoglobin: 10 g/dL — ABNORMAL LOW (ref 12.0–15.0)
Immature Granulocytes: 1 %
Lymphocytes Relative: 45 %
Lymphs Abs: 2.9 10*3/uL (ref 0.7–4.0)
MCH: 22.4 pg — ABNORMAL LOW (ref 26.0–34.0)
MCHC: 29.8 g/dL — ABNORMAL LOW (ref 30.0–36.0)
MCV: 75.3 fL — ABNORMAL LOW (ref 80.0–100.0)
Monocytes Absolute: 0.3 10*3/uL (ref 0.1–1.0)
Monocytes Relative: 5 %
Neutro Abs: 3 10*3/uL (ref 1.7–7.7)
Neutrophils Relative %: 46 %
Platelets: 336 10*3/uL (ref 150–400)
RBC: 4.46 MIL/uL (ref 3.87–5.11)
RDW: 18.7 % — ABNORMAL HIGH (ref 11.5–15.5)
WBC: 6.4 10*3/uL (ref 4.0–10.5)
nRBC: 0 % (ref 0.0–0.2)

## 2022-12-05 LAB — I-STAT VENOUS BLOOD GAS, ED
Acid-base deficit: 2 mmol/L (ref 0.0–2.0)
Bicarbonate: 23.5 mmol/L (ref 20.0–28.0)
Calcium, Ion: 1.02 mmol/L — ABNORMAL LOW (ref 1.15–1.40)
HCT: 34 % — ABNORMAL LOW (ref 36.0–46.0)
Hemoglobin: 11.6 g/dL — ABNORMAL LOW (ref 12.0–15.0)
O2 Saturation: 40 %
Potassium: 4 mmol/L (ref 3.5–5.1)
Sodium: 140 mmol/L (ref 135–145)
TCO2: 25 mmol/L (ref 22–32)
pCO2, Ven: 43.7 mmHg — ABNORMAL LOW (ref 44–60)
pH, Ven: 7.339 (ref 7.25–7.43)
pO2, Ven: 25 mmHg — CL (ref 32–45)

## 2022-12-05 LAB — TROPONIN I (HIGH SENSITIVITY): Troponin I (High Sensitivity): 3 ng/L (ref <18)

## 2022-12-05 LAB — ETHANOL: Alcohol, Ethyl (B): 190 mg/dL — ABNORMAL HIGH (ref <10)

## 2022-12-05 MED ORDER — PREDNISONE 20 MG PO TABS: 40.0000 mg | ORAL_TABLET | Freq: Every day | ORAL | 0 refills | Status: AC

## 2022-12-05 MED ORDER — NICOTINE 21 MG/24HR TD PT24: 21.0000 mg | MEDICATED_PATCH | Freq: Every day | TRANSDERMAL | Status: DC | PRN

## 2022-12-05 MED ORDER — PIPERACILLIN-TAZOBACTAM 3.375 G IVPB 30 MIN: 3.3750 g | Freq: Once | INTRAVENOUS | Status: DC

## 2022-12-05 MED ORDER — LORAZEPAM 1 MG PO TABS
0.0000 mg | ORAL_TABLET | Freq: Four times a day (QID) | ORAL | Status: DC
  Administered 2022-12-05: 2 mg via ORAL
  Filled 2022-12-05: qty 2

## 2022-12-05 MED ORDER — PIPERACILLIN-TAZOBACTAM 3.375 G IVPB: 3.3750 g | Freq: Three times a day (TID) | INTRAVENOUS | Status: DC

## 2022-12-05 MED ORDER — IOHEXOL 350 MG/ML SOLN
50.0000 mL | Freq: Once | INTRAVENOUS | Status: AC | PRN
  Administered 2022-12-05: 50 mL via INTRAVENOUS

## 2022-12-05 MED ORDER — VANCOMYCIN HCL 1250 MG/250ML IV SOLN
1250.0000 mg | Freq: Once | INTRAVENOUS | Status: DC
  Filled 2022-12-05: qty 250

## 2022-12-05 MED ORDER — MAGNESIUM SULFATE 2 GM/50ML IV SOLN
2.0000 g | Freq: Once | INTRAVENOUS | Status: AC
  Administered 2022-12-05: 2 g via INTRAVENOUS
  Filled 2022-12-05: qty 50

## 2022-12-05 MED ORDER — DOXYCYCLINE HYCLATE 100 MG PO CAPS: 100.0000 mg | ORAL_CAPSULE | Freq: Two times a day (BID) | ORAL | 0 refills | Status: DC

## 2022-12-05 MED ORDER — HYDROCODONE-ACETAMINOPHEN 5-325 MG PO TABS
1.0000 | ORAL_TABLET | Freq: Once | ORAL | Status: AC
  Administered 2022-12-05: 1 via ORAL
  Filled 2022-12-05: qty 1

## 2022-12-05 MED ORDER — THIAMINE HCL 100 MG/ML IJ SOLN
100.0000 mg | Freq: Once | INTRAMUSCULAR | Status: AC
  Administered 2022-12-05: 100 mg via INTRAVENOUS
  Filled 2022-12-05: qty 2

## 2022-12-05 MED ORDER — SODIUM CHLORIDE 0.9 % IV BOLUS: 1000.0000 mL | Freq: Once | INTRAVENOUS | Status: DC

## 2022-12-05 MED ORDER — METHYLPREDNISOLONE SODIUM SUCC 125 MG IJ SOLR
125.0000 mg | Freq: Once | INTRAMUSCULAR | Status: AC
  Administered 2022-12-05: 125 mg via INTRAVENOUS
  Filled 2022-12-05: qty 2

## 2022-12-05 MED ORDER — LORAZEPAM 1 MG PO TABS: 1.0000 mg | ORAL_TABLET | Freq: Four times a day (QID) | ORAL | Status: DC

## 2022-12-05 MED ORDER — THIAMINE HCL 100 MG/ML IJ SOLN: 100.0000 mg | Freq: Every day | INTRAMUSCULAR | Status: DC

## 2022-12-05 MED ORDER — LORAZEPAM 1 MG PO TABS: 0.0000 mg | ORAL_TABLET | Freq: Two times a day (BID) | ORAL | Status: DC

## 2022-12-05 MED ORDER — FOLIC ACID 1 MG PO TABS
1.0000 mg | ORAL_TABLET | Freq: Once | ORAL | Status: AC
  Administered 2022-12-05: 1 mg via ORAL
  Filled 2022-12-05: qty 1

## 2022-12-05 MED ORDER — LORAZEPAM 1 MG PO TABS: 1.0000 mg | ORAL_TABLET | Freq: Two times a day (BID) | ORAL | Status: DC

## 2022-12-05 MED ORDER — LACTATED RINGERS IV BOLUS
1000.0000 mL | Freq: Once | INTRAVENOUS | Status: AC
  Administered 2022-12-05: 1000 mL via INTRAVENOUS

## 2022-12-05 NOTE — ED Notes (Signed)
Patient transported to CT 

## 2022-12-05 NOTE — ED Provider Notes (Addendum)
57 yo female w/ COPD, smoking hx, presenting with wheezing, shortness of breath, etoh intoxication   Etoh 190 Trop 3 VBG with normal bicrab, no acidosis ECG without acute ischemic findings per my review  Normally on 3L O2 at baseline, no new requirements here Discharged 12/01/22 after COPD exacerbation treatment  Pending IV fluids, CT PE study   Physical Exam  BP (!) 92/54   Pulse (!) 106   Temp 98 F (36.7 C) (Oral)   Resp 20   LMP 01/08/2011   SpO2 98%   Physical Exam  Procedures  Procedures  ED Course / MDM   Clinical Course as of 12/05/22 0716  Fri Dec 05, 2022  0625 Reassessed patient, breath sounds significantly clearer however lower MAP still mildly tachycardic ordering a second liter fluid bolus.  No evidence of pneumonia on chest x-ray personally reviewed by me. [VB]    Clinical Course User Index [VB] Mardene Sayer, MD   Medical Decision Making Amount and/or Complexity of Data Reviewed Labs: ordered. Radiology: ordered.  Risk OTC drugs. Prescription drug management. Decision regarding hospitalization.   CT PE study concerning for new atypical infection pattern, questioning septic emboli presentation possibility.  Patient denies IV drugs to me (reports snorting cocaine and daily etoh use, "1 beer").  Prior blood cx on hospitalization was negative, but with this new change in CT PE finding from 1 month ago, I think it's reasonable to redraw blood culture, start BS antibiotics, and admit for observation and echocardiogram.  Patient tachycardic on reassessment - started CIWA protocol.  No evidence of severe sepsis, WBC wnl, will start with 1 L IV fluid now.   Terald Sleeper, MD 12/05/22 0820  Update 830 am -patient now says she is leaving AGAINST MEDICAL ADVICE.  I have clearly explained to her the dangers of leaving, and my strong recommendation for medical admission, but she is choosing to leave regardless.  She reports that she has "things  to take care of at home".  She is encouraged to return to soon as possible for treatment.    Terald Sleeper, MD 12/05/22 [phone]

## 2022-12-05 NOTE — Progress Notes (Signed)
Pharmacy Antibiotic Note  [name] is a 57 y.o. female admitted on 12/05/2022 with sepsis. CT concerning for possible septic emboli. Pharmacy has been consulted for vancomycin and zosyn dosing.  Scr 0.56, wbc wnl, afebrile.   Plan: Vancomycin 1250 mg IV x1, followed by vancomycin 1000 mg IV q24h (AUC 480, Scr 0.8, Vd 0.72)  Zosyn 3.375 g IV q8h  Monitor clinical progress, cultures, and renal function  F/u antibiotic de-escalation, length of therapy     Temp (24hrs), Avg:98.4 F (36.9 C), Min:98 F (36.7 C), Max:98.7 F (37.1 C)  Recent Labs  Lab 11/29/22 0115 11/30/22 0347 12/05/22 0452  WBC 9.9  --  6.4  CREATININE 0.56 0.49 0.56    Estimated Creatinine Clearance: 60.6 mL/min (by C-G formula based on SCr of 0.56 mg/dL).    Allergies  Allergen Reactions   Iron Dextran Shortness Of Breath and Anxiety   Nsaids Other (See Comments)    H/o gastric bypass - can cause ulcers   Aspirin Nausea And Vomiting and Other (See Comments)    Ok to take tylenol or ibuprofen    Penicillins Other (See Comments)    Unknown. Childhood reaction     Antimicrobials this admission: 6/14 vancomycin >>  6/14 Zosyn >>   Dose adjustments this admission: NA  Microbiology results: 6/14 BCx: sent  Thank you for allowing pharmacy to be a part of this patient's care.  Andreas Ohm, PharmD Pharmacy Resident  12/05/2022 8:17 AM

## 2022-12-05 NOTE — ED Notes (Signed)
Pt educated by RN and MD on recommended treatment and staying in hospital at this time. Pt continues to refuse all further treatment and staying in hospital at this time. IV removed, pt signed AMA form at this time and received papers furthering explaining risk of leaving hospital at this time as explained by MD. Pt ambualted out of ED w steady gait. Pt is a&ox4.

## 2022-12-05 NOTE — Discharge Instructions (Addendum)
You were diagnosed with a potentially serious lung and blood infection based on your workup in the ER today.  We strongly recommended that you stay in the hospital for IV antibiotics and further workup.  You decided to leave the hospital AGAINST MEDICAL ADVICE.  You understand that by leaving the hospital, you are at serious risk of worsening pain, difficulty breathing, respiratory failure, stroke and death.  You are encouraged to return to the hospital as soon as possible for medical care.  Antibiotics were sent to your pharmacy to begin taking at home.  Please note that antibiotics taken by mouth are not the same as IV antibiotics, and you are still recommended to return to the hospital for treatment.
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
## 2022-12-05 NOTE — ED Notes (Signed)
MD at bedside discussing plan of care at this time. Per pt, pt states "I need to go home and take care of things at my house before being admitted into hospital". MD aware of pt wanting to leave ED at this time. Pt is aware of plan of care and need admission and antibiotics at this time. Pt states she will return to ED "when home situation is handled". Pt is aware of risks leaving ED prior to completion of care and verbalized understanding of risk leaving at this time.
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
## 2022-12-10 ENCOUNTER — Emergency Department (HOSPITAL_COMMUNITY): Payer: 59

## 2022-12-10 ENCOUNTER — Inpatient Hospital Stay (HOSPITAL_COMMUNITY)
Admission: EM | Admit: 2022-12-10 | Discharge: 2022-12-12 | DRG: 190 | Disposition: A | Discharge status: Home / Self Care | Payer: 59 | Attending: Internal Medicine | Admitting: Internal Medicine

## 2022-12-10 ENCOUNTER — Other Ambulatory Visit: Payer: Self-pay

## 2022-12-10 ENCOUNTER — Encounter (HOSPITAL_COMMUNITY): Payer: Self-pay

## 2022-12-10 ENCOUNTER — Telehealth: Payer: Self-pay | Admitting: *Deleted

## 2022-12-10 DIAGNOSIS — F1910 Other psychoactive substance abuse, uncomplicated: Secondary | ICD-10-CM | POA: Diagnosis not present

## 2022-12-10 DIAGNOSIS — M25569 Pain in unspecified knee: Secondary | ICD-10-CM | POA: Diagnosis present

## 2022-12-10 DIAGNOSIS — E119 Type 2 diabetes mellitus without complications: Secondary | ICD-10-CM | POA: Diagnosis not present

## 2022-12-10 DIAGNOSIS — Z9884 Bariatric surgery status: Secondary | ICD-10-CM

## 2022-12-10 DIAGNOSIS — M549 Dorsalgia, unspecified: Secondary | ICD-10-CM | POA: Diagnosis present

## 2022-12-10 DIAGNOSIS — Z888 Allergy status to other drugs, medicaments and biological substances status: Secondary | ICD-10-CM

## 2022-12-10 DIAGNOSIS — Z743 Need for continuous supervision: Secondary | ICD-10-CM | POA: Diagnosis not present

## 2022-12-10 DIAGNOSIS — G8929 Other chronic pain: Secondary | ICD-10-CM | POA: Diagnosis not present

## 2022-12-10 DIAGNOSIS — E785 Hyperlipidemia, unspecified: Secondary | ICD-10-CM | POA: Diagnosis present

## 2022-12-10 DIAGNOSIS — I10 Essential (primary) hypertension: Secondary | ICD-10-CM | POA: Diagnosis not present

## 2022-12-10 DIAGNOSIS — F1410 Cocaine abuse, uncomplicated: Secondary | ICD-10-CM | POA: Diagnosis present

## 2022-12-10 DIAGNOSIS — Z90710 Acquired absence of both cervix and uterus: Secondary | ICD-10-CM

## 2022-12-10 DIAGNOSIS — F17210 Nicotine dependence, cigarettes, uncomplicated: Secondary | ICD-10-CM | POA: Diagnosis present

## 2022-12-10 DIAGNOSIS — G9341 Metabolic encephalopathy: Secondary | ICD-10-CM | POA: Diagnosis not present

## 2022-12-10 DIAGNOSIS — Z96641 Presence of right artificial hip joint: Secondary | ICD-10-CM | POA: Diagnosis present

## 2022-12-10 DIAGNOSIS — R0602 Shortness of breath: Secondary | ICD-10-CM | POA: Diagnosis not present

## 2022-12-10 DIAGNOSIS — R918 Other nonspecific abnormal finding of lung field: Secondary | ICD-10-CM | POA: Diagnosis not present

## 2022-12-10 DIAGNOSIS — Z833 Family history of diabetes mellitus: Secondary | ICD-10-CM

## 2022-12-10 DIAGNOSIS — F209 Schizophrenia, unspecified: Secondary | ICD-10-CM | POA: Diagnosis present

## 2022-12-10 DIAGNOSIS — Z7151 Drug abuse counseling and surveillance of drug abuser: Secondary | ICD-10-CM

## 2022-12-10 DIAGNOSIS — D75839 Thrombocytosis, unspecified: Secondary | ICD-10-CM | POA: Diagnosis present

## 2022-12-10 DIAGNOSIS — D649 Anemia, unspecified: Secondary | ICD-10-CM | POA: Diagnosis present

## 2022-12-10 DIAGNOSIS — E039 Hypothyroidism, unspecified: Secondary | ICD-10-CM | POA: Diagnosis present

## 2022-12-10 DIAGNOSIS — Z825 Family history of asthma and other chronic lower respiratory diseases: Secondary | ICD-10-CM

## 2022-12-10 DIAGNOSIS — E1165 Type 2 diabetes mellitus with hyperglycemia: Secondary | ICD-10-CM | POA: Diagnosis present

## 2022-12-10 DIAGNOSIS — K219 Gastro-esophageal reflux disease without esophagitis: Secondary | ICD-10-CM | POA: Diagnosis present

## 2022-12-10 DIAGNOSIS — I4891 Unspecified atrial fibrillation: Secondary | ICD-10-CM | POA: Diagnosis present

## 2022-12-10 DIAGNOSIS — Z7984 Long term (current) use of oral hypoglycemic drugs: Secondary | ICD-10-CM

## 2022-12-10 DIAGNOSIS — Z880 Allergy status to penicillin: Secondary | ICD-10-CM

## 2022-12-10 DIAGNOSIS — J441 Chronic obstructive pulmonary disease with (acute) exacerbation: Secondary | ICD-10-CM | POA: Diagnosis not present

## 2022-12-10 DIAGNOSIS — E1140 Type 2 diabetes mellitus with diabetic neuropathy, unspecified: Secondary | ICD-10-CM | POA: Diagnosis present

## 2022-12-10 DIAGNOSIS — Z9981 Dependence on supplemental oxygen: Secondary | ICD-10-CM | POA: Diagnosis not present

## 2022-12-10 DIAGNOSIS — I76 Septic arterial embolism: Secondary | ICD-10-CM | POA: Diagnosis not present

## 2022-12-10 DIAGNOSIS — F1010 Alcohol abuse, uncomplicated: Secondary | ICD-10-CM | POA: Diagnosis present

## 2022-12-10 DIAGNOSIS — Z7141 Alcohol abuse counseling and surveillance of alcoholic: Secondary | ICD-10-CM

## 2022-12-10 DIAGNOSIS — J9621 Acute and chronic respiratory failure with hypoxia: Secondary | ICD-10-CM | POA: Diagnosis not present

## 2022-12-10 DIAGNOSIS — R0789 Other chest pain: Secondary | ICD-10-CM | POA: Diagnosis not present

## 2022-12-10 DIAGNOSIS — Z79899 Other long term (current) drug therapy: Secondary | ICD-10-CM

## 2022-12-10 DIAGNOSIS — Z886 Allergy status to analgesic agent status: Secondary | ICD-10-CM

## 2022-12-10 DIAGNOSIS — D509 Iron deficiency anemia, unspecified: Secondary | ICD-10-CM | POA: Diagnosis present

## 2022-12-10 DIAGNOSIS — F319 Bipolar disorder, unspecified: Secondary | ICD-10-CM | POA: Diagnosis present

## 2022-12-10 DIAGNOSIS — Z8249 Family history of ischemic heart disease and other diseases of the circulatory system: Secondary | ICD-10-CM

## 2022-12-10 DIAGNOSIS — Z7951 Long term (current) use of inhaled steroids: Secondary | ICD-10-CM

## 2022-12-10 LAB — CBC WITH DIFFERENTIAL/PLATELET
Abs Immature Granulocytes: 0.11 10*3/uL — ABNORMAL HIGH (ref 0.00–0.07)
Basophils Absolute: 0 10*3/uL (ref 0.0–0.1)
Basophils Relative: 0 %
Eosinophils Absolute: 0 10*3/uL (ref 0.0–0.5)
Eosinophils Relative: 0 %
HCT: 30.1 % — ABNORMAL LOW (ref 36.0–46.0)
Hemoglobin: 9.2 g/dL — ABNORMAL LOW (ref 12.0–15.0)
Immature Granulocytes: 1 %
Lymphocytes Relative: 7 %
Lymphs Abs: 0.6 10*3/uL — ABNORMAL LOW (ref 0.7–4.0)
MCH: 22.4 pg — ABNORMAL LOW (ref 26.0–34.0)
MCHC: 30.6 g/dL (ref 30.0–36.0)
MCV: 73.2 fL — ABNORMAL LOW (ref 80.0–100.0)
Monocytes Absolute: 0.1 10*3/uL (ref 0.1–1.0)
Monocytes Relative: 1 %
Neutro Abs: 6.9 10*3/uL (ref 1.7–7.7)
Neutrophils Relative %: 91 %
Platelets: 569 10*3/uL — ABNORMAL HIGH (ref 150–400)
RBC: 4.11 MIL/uL (ref 3.87–5.11)
RDW: 19.3 % — ABNORMAL HIGH (ref 11.5–15.5)
WBC: 7.8 10*3/uL (ref 4.0–10.5)
nRBC: 0 % (ref 0.0–0.2)

## 2022-12-10 LAB — BASIC METABOLIC PANEL
Anion gap: 11 (ref 5–15)
BUN: 8 mg/dL (ref 6–20)
CO2: 21 mmol/L — ABNORMAL LOW (ref 22–32)
Calcium: 8.3 mg/dL — ABNORMAL LOW (ref 8.9–10.3)
Chloride: 103 mmol/L (ref 98–111)
Creatinine, Ser: 0.53 mg/dL (ref 0.44–1.00)
GFR, Estimated: >60 mL/min (ref >60)
Glucose, Bld: 235 mg/dL — ABNORMAL HIGH (ref 70–99)
Potassium: 4.5 mmol/L (ref 3.5–5.1)
Sodium: 135 mmol/L (ref 135–145)

## 2022-12-10 LAB — TROPONIN I (HIGH SENSITIVITY): Troponin I (High Sensitivity): 4 ng/L (ref <18)

## 2022-12-10 MED ORDER — ALBUTEROL SULFATE HFA 108 (90 BASE) MCG/ACT IN AERS: 2.0000 | INHALATION_SPRAY | RESPIRATORY_TRACT | Status: DC | PRN

## 2022-12-10 NOTE — ED Provider Notes (Signed)
[place] EMERGENCY DEPARTMENT AT [facility] Provider Note   CSN: [id number] Arrival date & time: 12/10/22  2153     History  Chief Complaint  Patient presents with   Shortness of Breath    [name] is a 57 y.o. female with history of polysubstance abuse, COPD, hypertension, and type 2 diabetes with history of recent admission to the hospital for COPD exacerbation discharged on 6/10 from hospital medicine service who presents this evening with concern for shortness of breath and worsening left sided chest/back pain.  Patient supposed to be on oxygen at baseline though she is not sure how much, currently wearing 3 L of oxygen due to work of breathing.  Patient was seen in the emergency department on 6/14 for similar symptoms, treated for COPD exacerbation and also had PE study which revealed at that time Unusual airspace consolidation nodular in appearance concerning for infectious versus inflammatory process, per radiologist concern for possible septic emboli.  Patient left the hospital AMA at that time and returns to the emergency department this evening feeling worse.  Unclear compliance with her medications at home, patient does not appear to be on any anticoagulation.  Endorses smoking cocaine regularly and drinking alcohol but denies any recent IV drug use.  HPI     Home Medications Prior to Admission medications   Medication Sig Start Date End Date Taking? Authorizing Provider  albuterol (VENTOLIN HFA) 108 (90 Base) MCG/ACT inhaler Inhale 2 puffs into the lungs every 4 (four) hours as needed for wheezing or shortness of breath. 12/01/22  Yes Ghimire, Werner Lean, MD  budesonide-formoterol (SYMBICORT) 160-4.5 MCG/ACT inhaler Inhale 2 puffs into the lungs 2 (two) times daily. 12/01/22  Yes Ghimire, Werner Lean, MD  tiotropium (SPIRIVA HANDIHALER) 18 MCG inhalation capsule Place 1 capsule (18 mcg total) into inhaler and inhale daily. 12/01/22 12/01/23 Yes Ghimire, Werner Lean,  MD  doxycycline (VIBRAMYCIN) 100 MG capsule Take 1 capsule (100 mg total) by mouth 2 (two) times daily for 7 days. Patient not taking: Reported on 12/11/2022 12/05/22 12/12/22  Terald Sleeper, MD  metFORMIN (GLUCOPHAGE) 500 MG tablet Take 1 tablet (500 mg total) by mouth 2 (two) times daily with a meal. Patient not taking: Reported on 12/11/2022 12/01/22   Maretta Bees, MD  metoprolol tartrate (LOPRESSOR) 25 MG tablet Take 1 tablet (25 mg total) by mouth 2 (two) times daily. Patient not taking: Reported on 12/11/2022 12/01/22   Maretta Bees, MD  OLANZapine (ZYPREXA) 10 MG tablet Take 1 tablet (10 mg total) by mouth at bedtime. Patient not taking: Reported on 12/11/2022 12/01/22   Maretta Bees, MD  thiamine (VITAMIN B1) 100 MG tablet Take 1 tablet (100 mg total) by mouth daily. Patient not taking: Reported on 12/11/2022 12/02/22   Maretta Bees, MD      Allergies    Iron dextran, Aspirin, and Penicillins    Review of Systems   Review of Systems  Constitutional: Negative.   HENT: Negative.    Respiratory:  Positive for chest tightness and shortness of breath.   Cardiovascular:  Positive for chest pain. Negative for palpitations and leg swelling.  Gastrointestinal: Negative.   Genitourinary: Negative.   Musculoskeletal: Negative.     Physical Exam Updated Vital Signs BP 137/88 (BP Location: Right Arm)   Pulse 99   Temp 98.2 F (36.8 C) (Oral)   Resp (!) 23   LMP 01/08/2011   SpO2 96%  Physical Exam Vitals and nursing note  reviewed.  Constitutional:      Appearance: She is not ill-appearing or toxic-appearing.  HENT:     Head: Normocephalic and atraumatic.     Mouth/Throat:     Mouth: Mucous membranes are moist.     Pharynx: No oropharyngeal exudate or posterior oropharyngeal erythema.  Eyes:     General:        Right eye: No discharge.        Left eye: No discharge.     Conjunctiva/sclera: Conjunctivae normal.  Cardiovascular:     Rate and Rhythm: Normal  rate and regular rhythm.     Pulses: Normal pulses.  Pulmonary:     Effort: Pulmonary effort is normal. No respiratory distress.     Breath sounds: Examination of the left-upper field reveals wheezing. Examination of the right-middle field reveals wheezing. Examination of the left-middle field reveals wheezing. Examination of the right-lower field reveals wheezing. Examination of the left-lower field reveals wheezing. Wheezing present. No rales.  Chest:     Chest wall: No mass, tenderness or edema.  Abdominal:     General: Bowel sounds are normal. There is no distension.     Tenderness: There is no abdominal tenderness.  Musculoskeletal:        General: No deformity.     Cervical back: Neck supple.     Right lower leg: No tenderness. No edema.     Left lower leg: No tenderness. No edema.  Skin:    General: Skin is warm and dry.     Capillary Refill: Capillary refill takes less than 2 seconds.  Neurological:     General: No focal deficit present.     Mental Status: She is alert and oriented to person, place, and time. Mental status is at baseline.  Psychiatric:        Mood and Affect: Mood normal.     ED Results / Procedures / Treatments   Labs (all labs ordered are listed, but only abnormal results are displayed) Labs Reviewed  BASIC METABOLIC PANEL - Abnormal; Notable for the following components:      Result Value   CO2 21 (*)    Glucose, Bld 235 (*)    Calcium 8.3 (*)    All other components within normal limits  CBC WITH DIFFERENTIAL/PLATELET - Abnormal; Notable for the following components:   Hemoglobin 9.2 (*)    HCT 30.1 (*)    MCV 73.2 (*)    MCH 22.4 (*)    RDW 19.3 (*)    Platelets 569 (*)    Lymphs Abs 0.6 (*)    Abs Immature Granulocytes 0.11 (*)    All other components within normal limits  HEPATIC FUNCTION PANEL - Abnormal; Notable for the following components:   Albumin 3.3 (*)    AST 65 (*)    ALT 47 (*)    All other components within normal limits   BASIC METABOLIC PANEL - Abnormal; Notable for the following components:   Sodium 134 (*)    Glucose, Bld 255 (*)    BUN 5 (*)    Calcium 8.2 (*)    All other components within normal limits  CBC - Abnormal; Notable for the following components:   Hemoglobin 9.2 (*)    HCT 30.9 (*)    MCV 73.2 (*)    MCH 21.8 (*)    MCHC 29.8 (*)    RDW 19.1 (*)    Platelets 555 (*)    All other components within normal limits  CULTURE, BLOOD (ROUTINE X 2)  CULTURE, BLOOD (ROUTINE X 2)  TROPONIN I (HIGH SENSITIVITY)  TROPONIN I (HIGH SENSITIVITY)    EKG EKG Interpretation  Date/Time:  Wednesday December 10 2022 21:59:52 EDT Ventricular Rate:  99 PR Interval:  126 QRS Duration: 84 QT Interval:  346 QTC Calculation: 444 R Axis:   18 Text Interpretation: Normal sinus rhythm Possible Left atrial enlargement Cannot rule out Anterior infarct , age undetermined Abnormal ECG When compared with ECG of 05-Dec-2022 05:05, PREVIOUS ECG IS PRESENT Confirmed by Kennis Carina [phone]) on 12/11/2022 12:52:42 AM  Radiology DG Chest 2 View  Result Date: 12/10/2022 CLINICAL DATA:  Shortness of breath EXAM: CHEST - 2 VIEW COMPARISON:  Chest x-ray 12/05/2022.  CT of the chest 12/05/2022. FINDINGS: Small focal nodular opacity in the left upper lobe is unchanged. Band of opacity in the left lower lung is unchanged. Right lung is clear. No pleural effusion or pneumothorax. Cardiomediastinal silhouette is stable, the heart is mildly enlarged. Osseous structures are unchanged. There is compression deformity of the midthoracic vertebral body. IMPRESSION: 1. Unchanged left upper lobe nodular density and linear band of opacity in the left lower lung. Findings are indeterminate and may be infectious/inflammatory. Follow-up recommended to confirm complete resolution. Electronically Signed   By: Darliss Cheney M.D.   On: 12/10/2022 22:48    Procedures Procedures    Medications Ordered in ED Medications  ipratropium-albuterol  (DUONEB) 0.5-2.5 (3) MG/3ML nebulizer solution 3 mL (0 mLs Nebulization Hold 12/11/22 0128)  metoprolol tartrate (LOPRESSOR) tablet 25 mg (has no administration in time range)  OLANZapine (ZYPREXA) tablet 10 mg (has no administration in time range)  thiamine (VITAMIN B1) tablet 100 mg (has no administration in time range)  albuterol (PROVENTIL) (2.5 MG/3ML) 0.083% nebulizer solution 2.5 mg (has no administration in time range)  cefTRIAXone (ROCEPHIN) 2 g in sodium chloride 0.9 % 100 mL IVPB (has no administration in time range)  enoxaparin (LOVENOX) injection 40 mg (has no administration in time range)  0.9 %  sodium chloride infusion ( Intravenous Infusion Verify 12/11/22 0650)  acetaminophen (TYLENOL) tablet 650 mg (650 mg Oral Given 12/11/22 0420)    Or  acetaminophen (TYLENOL) suppository 650 mg ( Rectal See Alternative 12/11/22 0420)  traZODone (DESYREL) tablet 25 mg (has no administration in time range)  magnesium hydroxide (MILK OF MAGNESIA) suspension 30 mL (has no administration in time range)  ondansetron (ZOFRAN) tablet 4 mg (has no administration in time range)    Or  ondansetron (ZOFRAN) injection 4 mg (has no administration in time range)  ipratropium-albuterol (DUONEB) 0.5-2.5 (3) MG/3ML nebulizer solution 3 mL (has no administration in time range)  guaiFENesin (MUCINEX) 12 hr tablet 600 mg (600 mg Oral Given 12/11/22 0254)  chlorpheniramine-HYDROcodone (TUSSIONEX) 10-8 MG/5ML suspension 5 mL (has no administration in time range)  insulin aspart (novoLOG) injection 0-15 Units (has no administration in time range)  insulin aspart (novoLOG) injection 0-5 Units (has no administration in time range)  diphenhydrAMINE (BENADRYL) capsule 25 mg (25 mg Oral Given 12/11/22 0254)  ipratropium-albuterol (DUONEB) 0.5-2.5 (3) MG/3ML nebulizer solution 3 mL (has no administration in time range)  umeclidinium bromide (INCRUSE ELLIPTA) 62.5 MCG/ACT 1 puff (has no administration in time range)   cefTRIAXone (ROCEPHIN) 1 g in sodium chloride 0.9 % 100 mL IVPB (0 g Intravenous Stopped 12/11/22 0156)  azithromycin (ZITHROMAX) 500 mg in sodium chloride 0.9 % 250 mL IVPB (0 mg Intravenous Stopped 12/11/22 0310)  lactated ringers bolus 1,000 mL (1,000 mLs Intravenous  New Bag/Given 12/11/22 0235)  morphine (PF) 4 MG/ML injection 4 mg (4 mg Intravenous Given 12/11/22 0227)  ondansetron (ZOFRAN) injection 4 mg (4 mg Intravenous Given 12/11/22 0227)  cefTRIAXone (ROCEPHIN) 1 g in sodium chloride 0.9 % 100 mL IVPB (0 g Intravenous Stopped 12/11/22 0345)    ED Course/ Medical Decision Making/ A&P Clinical Course as of 12/11/22 0655  Thu Dec 11, 2022  0222 Consulted Dr. Arville Care, hospitalist was agreeable to admit this patient to his service for further workup of concerning CT PE study from 6/14 as well as ongoing shortness of breath and worsening left-sided chest/back pain.  I appreciate his consultation care of this patient. [RS]    Clinical Course User Index [RS] Erman Thum, Eugene Gavia, PA-C                             Medical Decision Making 57 year old female appears older than her stated age who presents with concern for shortness of breath worsening left-sided chest pain context of recent abnormal CTA concerning for possible septic emboli.  DDx includes but is not limited to pneumonia, septic emboli, PEs, COPD exacerbation.   Amount and/or Complexity of Data Reviewed Labs: ordered.    Details: CBC with anemia with hemoglobin 9 patient's baseline.  Platelets elevated 555, increased from 1 week ago, BMP with mild hyponatremia of 134, hyperglycemia.  Hepatic function panel with transaminitis with AST/ALT 65/47.  Troponin is negative x 2.  Blood cultures pending.  Radiology: ordered.    Details:  Chest x-ray with unchanged linear opacity in the left lower lung and nodular densities in the left upper lobe.  ECG/medicine tests:     Details:  EKG with normal sinus rhythm, no ischemic  changes.  Risk Prescription drug management. Decision regarding hospitalization.     Patient admitted to hospitalist Dr. Arville Care as above.  IV antibiotics administered as well as IV fluids.  Patient remains comfortable on 3 L nasal cannula, vital signs reassuring at this time following fluid bolus.  Patient admitted to the hospital for further workup for worsening shortness of breath and abnormal CT.  Willy voiced understanding of her medical evaluation and treatment plan. Each of their questions answered to their expressed satisfaction.    This chart was dictated using voice recognition software, Dragon. Despite the best efforts of this provider to proofread and correct errors, errors may still occur which can change documentation meaning.   Final Clinical Impression(s) / ED Diagnoses Final diagnoses:  SOB (shortness of breath)    Rx / DC Orders ED Discharge Orders     None         Sherrilee Gilles 12/11/22 1610    Sabas Sous, MD 12/11/22 0700

## 2022-12-10 NOTE — ED Triage Notes (Signed)
PT BIB [facility] from home c/o chronic back pain and SHOB. Pt is on 3L [place] at baseline. Pt has had heavy ETOH use today and unsure if she smoked crack or not per EMS.    99 3L 193 CBG 110/78 98 20

## 2022-12-10 NOTE — ED Provider Notes (Incomplete)
[place] EMERGENCY DEPARTMENT AT [facility] Provider Note   CSN: [id number] Arrival date & time: 12/10/22  2153     History {Add pertinent medical, surgical, social history, OB history to HPI:1} Chief Complaint  Patient presents with  . Shortness of Breath    [name] is a 57 y.o. female.  HPI     Home Medications Prior to Admission medications   Medication Sig Start Date End Date Taking? Authorizing Provider  albuterol (VENTOLIN HFA) 108 (90 Base) MCG/ACT inhaler Inhale 2 puffs into the lungs every 4 (four) hours as needed for wheezing or shortness of breath. 12/01/22   Ghimire, Werner Lean, MD  budesonide-formoterol (SYMBICORT) 160-4.5 MCG/ACT inhaler Inhale 2 puffs into the lungs 2 (two) times daily. 12/01/22   Ghimire, Werner Lean, MD  doxycycline (VIBRAMYCIN) 100 MG capsule Take 1 capsule (100 mg total) by mouth 2 (two) times daily for 7 days. 12/05/22 12/12/22  Mardene Sayer, MD  doxycycline (VIBRAMYCIN) 100 MG capsule Take 1 capsule (100 mg total) by mouth 2 (two) times daily for 7 days. 12/05/22 12/12/22  Terald Sleeper, MD  metFORMIN (GLUCOPHAGE) 500 MG tablet Take 1 tablet (500 mg total) by mouth 2 (two) times daily with a meal. 12/01/22   Ghimire, Werner Lean, MD  metoprolol tartrate (LOPRESSOR) 25 MG tablet Take 1 tablet (25 mg total) by mouth 2 (two) times daily. 12/01/22   Ghimire, Werner Lean, MD  OLANZapine (ZYPREXA) 10 MG tablet Take 1 tablet (10 mg total) by mouth at bedtime. 12/01/22   Ghimire, Werner Lean, MD  thiamine (VITAMIN B1) 100 MG tablet Take 1 tablet (100 mg total) by mouth daily. 12/02/22   Ghimire, Werner Lean, MD  tiotropium (SPIRIVA HANDIHALER) 18 MCG inhalation capsule Place 1 capsule (18 mcg total) into inhaler and inhale daily. 12/01/22 12/01/23  GhimireWerner Lean, MD      Allergies    Iron dextran, Nsaids, Aspirin, and Penicillins    Review of Systems   Review of Systems  Physical Exam Updated Vital Signs BP (!) 113/95 (BP Location:  Right Arm)   Pulse (!) 102   Temp 98.6 F (37 C) (Oral)   Resp 20   LMP 01/08/2011   SpO2 97%  Physical Exam  ED Results / Procedures / Treatments   Labs (all labs ordered are listed, but only abnormal results are displayed) Labs Reviewed  BASIC METABOLIC PANEL - Abnormal; Notable for the following components:      Result Value   CO2 21 (*)    Glucose, Bld 235 (*)    Calcium 8.3 (*)    All other components within normal limits  CBC WITH DIFFERENTIAL/PLATELET - Abnormal; Notable for the following components:   Hemoglobin 9.2 (*)    HCT 30.1 (*)    MCV 73.2 (*)    MCH 22.4 (*)    RDW 19.3 (*)    Platelets 569 (*)    Lymphs Abs 0.6 (*)    Abs Immature Granulocytes 0.11 (*)    All other components within normal limits  TROPONIN I (HIGH SENSITIVITY)    EKG None  Radiology DG Chest 2 View  Result Date: 12/10/2022 CLINICAL DATA:  Shortness of breath EXAM: CHEST - 2 VIEW COMPARISON:  Chest x-ray 12/05/2022.  CT of the chest 12/05/2022. FINDINGS: Small focal nodular opacity in the left upper lobe is unchanged. Band of opacity in the left lower lung is unchanged. Right lung is clear. No pleural effusion or pneumothorax. Cardiomediastinal  silhouette is stable, the heart is mildly enlarged. Osseous structures are unchanged. There is compression deformity of the midthoracic vertebral body. IMPRESSION: 1. Unchanged left upper lobe nodular density and linear band of opacity in the left lower lung. Findings are indeterminate and may be infectious/inflammatory. Follow-up recommended to confirm complete resolution. Electronically Signed   By: Darliss Cheney M.D.   On: 12/10/2022 22:48    Procedures Procedures  {Document cardiac monitor, telemetry assessment procedure when appropriate:1}  Medications Ordered in ED Medications  albuterol (VENTOLIN HFA) 108 (90 Base) MCG/ACT inhaler 2 puff (has no administration in time range)    ED Course/ Medical Decision Making/ A&P   {   Click here  for ABCD2, HEART and other calculatorsREFRESH Note before signing :1}                          Medical Decision Making Amount and/or Complexity of Data Reviewed Radiology: ordered.  Risk Prescription drug management.   ***  {Document critical care time when appropriate:1} {Document review of labs and clinical decision tools ie heart score, Chads2Vasc2 etc:1}  {Document your independent review of radiology images, and any outside records:1} {Document your discussion with family members, caretakers, and with consultants:1} {Document social determinants of health affecting pt's care:1} {Document your decision making why or why not admission, treatments were needed:1} Final Clinical Impression(s) / ED Diagnoses Final diagnoses:  None    Rx / DC Orders ED Discharge Orders     None

## 2022-12-10 NOTE — Telephone Encounter (Signed)
Transition Care Management Unsuccessful Follow-up Telephone Call  Date of discharge and from where:  [facility] 12/05/2022  Attempts:  1st Attempt  Reason for unsuccessful TCM follow-up call:  No answer/busy

## 2022-12-10 NOTE — ED Provider Triage Note (Signed)
Emergency Medicine Provider Triage Evaluation Note  [name] , a 57 y.o. female  was evaluated in triage.  Pt complains of sob and back pain. Report chronic back pain, and having sob along with chest pain all day today. Admits to ETOH use . Denies cocaine use.  No fever, cough.  Endorse back pain.  On chronic O2   Review of Systems  Positive: As above Negative: As above  Physical Exam  BP (!) 113/95 (BP Location: Right Arm)   Pulse (!) 102   Temp 98.6 F (37 C) (Oral)   Resp 20   LMP 01/08/2011   SpO2 97%  Gen:   Awake, no distress   Resp:  Normal effort  MSK:   Moves extremities without difficulty  Other:    Medical Decision Making  Medically screening exam initiated at 10:16 PM.  Appropriate orders placed.  [name] was informed that the remainder of the evaluation will be completed by another provider, this initial triage assessment does not replace that evaluation, and the importance of remaining in the ED until their evaluation is complete.     Fayrene Helper, PA-C 12/10/22 2218

## 2022-12-11 ENCOUNTER — Telehealth: Payer: Self-pay | Admitting: *Deleted

## 2022-12-11 DIAGNOSIS — F1010 Alcohol abuse, uncomplicated: Secondary | ICD-10-CM | POA: Diagnosis present

## 2022-12-11 DIAGNOSIS — D75839 Thrombocytosis, unspecified: Secondary | ICD-10-CM | POA: Diagnosis present

## 2022-12-11 DIAGNOSIS — F1910 Other psychoactive substance abuse, uncomplicated: Secondary | ICD-10-CM

## 2022-12-11 DIAGNOSIS — E1165 Type 2 diabetes mellitus with hyperglycemia: Secondary | ICD-10-CM | POA: Diagnosis present

## 2022-12-11 DIAGNOSIS — E119 Type 2 diabetes mellitus without complications: Secondary | ICD-10-CM

## 2022-12-11 DIAGNOSIS — D649 Anemia, unspecified: Secondary | ICD-10-CM | POA: Diagnosis present

## 2022-12-11 DIAGNOSIS — Z9884 Bariatric surgery status: Secondary | ICD-10-CM | POA: Diagnosis not present

## 2022-12-11 DIAGNOSIS — I4891 Unspecified atrial fibrillation: Secondary | ICD-10-CM | POA: Diagnosis present

## 2022-12-11 DIAGNOSIS — R0602 Shortness of breath: Secondary | ICD-10-CM | POA: Diagnosis present

## 2022-12-11 DIAGNOSIS — E1140 Type 2 diabetes mellitus with diabetic neuropathy, unspecified: Secondary | ICD-10-CM | POA: Diagnosis present

## 2022-12-11 DIAGNOSIS — I76 Septic arterial embolism: Secondary | ICD-10-CM

## 2022-12-11 DIAGNOSIS — G9341 Metabolic encephalopathy: Secondary | ICD-10-CM | POA: Diagnosis present

## 2022-12-11 DIAGNOSIS — I10 Essential (primary) hypertension: Secondary | ICD-10-CM

## 2022-12-11 DIAGNOSIS — R918 Other nonspecific abnormal finding of lung field: Secondary | ICD-10-CM

## 2022-12-11 DIAGNOSIS — F17210 Nicotine dependence, cigarettes, uncomplicated: Secondary | ICD-10-CM | POA: Diagnosis present

## 2022-12-11 DIAGNOSIS — M549 Dorsalgia, unspecified: Secondary | ICD-10-CM | POA: Diagnosis present

## 2022-12-11 DIAGNOSIS — Z7984 Long term (current) use of oral hypoglycemic drugs: Secondary | ICD-10-CM | POA: Diagnosis not present

## 2022-12-11 DIAGNOSIS — Z9981 Dependence on supplemental oxygen: Secondary | ICD-10-CM | POA: Diagnosis not present

## 2022-12-11 DIAGNOSIS — M25569 Pain in unspecified knee: Secondary | ICD-10-CM | POA: Diagnosis present

## 2022-12-11 DIAGNOSIS — J9621 Acute and chronic respiratory failure with hypoxia: Secondary | ICD-10-CM

## 2022-12-11 DIAGNOSIS — F209 Schizophrenia, unspecified: Secondary | ICD-10-CM | POA: Diagnosis present

## 2022-12-11 DIAGNOSIS — J441 Chronic obstructive pulmonary disease with (acute) exacerbation: Secondary | ICD-10-CM | POA: Diagnosis not present

## 2022-12-11 DIAGNOSIS — F1410 Cocaine abuse, uncomplicated: Secondary | ICD-10-CM | POA: Diagnosis present

## 2022-12-11 DIAGNOSIS — Z96641 Presence of right artificial hip joint: Secondary | ICD-10-CM | POA: Diagnosis present

## 2022-12-11 DIAGNOSIS — E785 Hyperlipidemia, unspecified: Secondary | ICD-10-CM | POA: Diagnosis present

## 2022-12-11 DIAGNOSIS — E039 Hypothyroidism, unspecified: Secondary | ICD-10-CM | POA: Diagnosis present

## 2022-12-11 DIAGNOSIS — F319 Bipolar disorder, unspecified: Secondary | ICD-10-CM | POA: Diagnosis present

## 2022-12-11 DIAGNOSIS — G8929 Other chronic pain: Secondary | ICD-10-CM | POA: Diagnosis present

## 2022-12-11 DIAGNOSIS — K219 Gastro-esophageal reflux disease without esophagitis: Secondary | ICD-10-CM | POA: Diagnosis present

## 2022-12-11 DIAGNOSIS — J449 Chronic obstructive pulmonary disease, unspecified: Secondary | ICD-10-CM | POA: Diagnosis not present

## 2022-12-11 LAB — CBC
HCT: 30.9 % — ABNORMAL LOW (ref 36.0–46.0)
Hemoglobin: 9.2 g/dL — ABNORMAL LOW (ref 12.0–15.0)
MCH: 21.8 pg — ABNORMAL LOW (ref 26.0–34.0)
MCHC: 29.8 g/dL — ABNORMAL LOW (ref 30.0–36.0)
MCV: 73.2 fL — ABNORMAL LOW (ref 80.0–100.0)
Platelets: 555 10*3/uL — ABNORMAL HIGH (ref 150–400)
RBC: 4.22 MIL/uL (ref 3.87–5.11)
RDW: 19.1 % — ABNORMAL HIGH (ref 11.5–15.5)
WBC: 7.7 10*3/uL (ref 4.0–10.5)
nRBC: 0 % (ref 0.0–0.2)

## 2022-12-11 LAB — BASIC METABOLIC PANEL
Anion gap: 9 (ref 5–15)
BUN: 5 mg/dL — ABNORMAL LOW (ref 6–20)
CO2: 25 mmol/L (ref 22–32)
Calcium: 8.2 mg/dL — ABNORMAL LOW (ref 8.9–10.3)
Chloride: 100 mmol/L (ref 98–111)
Creatinine, Ser: 0.56 mg/dL (ref 0.44–1.00)
GFR, Estimated: >60 mL/min (ref >60)
Glucose, Bld: 255 mg/dL — ABNORMAL HIGH (ref 70–99)
Potassium: 4.4 mmol/L (ref 3.5–5.1)
Sodium: 134 mmol/L — ABNORMAL LOW (ref 135–145)

## 2022-12-11 LAB — HEPATIC FUNCTION PANEL
ALT: 47 U/L — ABNORMAL HIGH (ref 0–44)
AST: 65 U/L — ABNORMAL HIGH (ref 15–41)
Albumin: 3.3 g/dL — ABNORMAL LOW (ref 3.5–5.0)
Alkaline Phosphatase: 114 U/L (ref 38–126)
Bilirubin, Direct: 0.1 mg/dL (ref 0.0–0.2)
Indirect Bilirubin: 0.7 mg/dL (ref 0.3–0.9)
Total Bilirubin: 0.8 mg/dL (ref 0.3–1.2)
Total Protein: 7.1 g/dL (ref 6.5–8.1)

## 2022-12-11 LAB — GLUCOSE, CAPILLARY
Glucose-Capillary: 130 mg/dL — ABNORMAL HIGH (ref 70–99)
Glucose-Capillary: 157 mg/dL — ABNORMAL HIGH (ref 70–99)
Glucose-Capillary: 106 mg/dL — ABNORMAL HIGH (ref 70–99)
Glucose-Capillary: 248 mg/dL — ABNORMAL HIGH (ref 70–99)

## 2022-12-11 LAB — BLOOD GAS, VENOUS
Acid-Base Excess: 5.1 mmol/L — ABNORMAL HIGH (ref 0.0–2.0)
Bicarbonate: 32.2 mmol/L — ABNORMAL HIGH (ref 20.0–28.0)
Drawn by: 4956
O2 Saturation: 77.3 %
Patient temperature: 36.9
pCO2, Ven: 57 mmHg (ref 44–60)
pH, Ven: 7.36 (ref 7.25–7.43)
pO2, Ven: 49 mmHg — ABNORMAL HIGH (ref 32–45)

## 2022-12-11 LAB — TROPONIN I (HIGH SENSITIVITY): Troponin I (High Sensitivity): 4 ng/L (ref <18)

## 2022-12-11 LAB — AMMONIA: Ammonia: 61 umol/L — ABNORMAL HIGH (ref 9–35)

## 2022-12-11 MED ORDER — UMECLIDINIUM BROMIDE 62.5 MCG/ACT IN AEPB
1.0000 | INHALATION_SPRAY | Freq: Every day | RESPIRATORY_TRACT | Status: DC
  Administered 2022-12-11 – 2022-12-12 (×2): 1 via RESPIRATORY_TRACT
  Filled 2022-12-11: qty 7

## 2022-12-11 MED ORDER — ONDANSETRON HCL 4 MG/2ML IJ SOLN: 4.0000 mg | Freq: Four times a day (QID) | INTRAMUSCULAR | Status: DC | PRN

## 2022-12-11 MED ORDER — INSULIN ASPART 100 UNIT/ML IJ SOLN
0.0000 [IU] | Freq: Three times a day (TID) | INTRAMUSCULAR | Status: DC
  Administered 2022-12-11: 2 [IU] via SUBCUTANEOUS
  Administered 2022-12-11: 3 [IU] via SUBCUTANEOUS
  Administered 2022-12-12: 2 [IU] via SUBCUTANEOUS

## 2022-12-11 MED ORDER — GUAIFENESIN ER 600 MG PO TB12
600.0000 mg | ORAL_TABLET | Freq: Two times a day (BID) | ORAL | Status: DC
  Administered 2022-12-11 – 2022-12-12 (×4): 600 mg via ORAL
  Filled 2022-12-11 (×4): qty 1

## 2022-12-11 MED ORDER — METHYLPREDNISOLONE SODIUM SUCC 125 MG IJ SOLR
80.0000 mg | INTRAMUSCULAR | Status: DC
  Administered 2022-12-11 – 2022-12-12 (×2): 80 mg via INTRAVENOUS
  Filled 2022-12-11 (×2): qty 2

## 2022-12-11 MED ORDER — OLANZAPINE 10 MG PO TABS
10.0000 mg | ORAL_TABLET | Freq: Every day | ORAL | Status: DC
  Administered 2022-12-11: 10 mg via ORAL
  Filled 2022-12-11: qty 1
  Filled 2022-12-11: qty 2
  Filled 2022-12-11: qty 1

## 2022-12-11 MED ORDER — MAGNESIUM HYDROXIDE 400 MG/5ML PO SUSP: 30.0000 mL | Freq: Every day | ORAL | Status: DC | PRN

## 2022-12-11 MED ORDER — MUPIROCIN 2 % EX OINT: TOPICAL_OINTMENT | Freq: Two times a day (BID) | CUTANEOUS | Status: DC

## 2022-12-11 MED ORDER — BUDESONIDE 0.25 MG/2ML IN SUSP
0.2500 mg | Freq: Two times a day (BID) | RESPIRATORY_TRACT | Status: DC
  Administered 2022-12-11 – 2022-12-12 (×3): 0.25 mg via RESPIRATORY_TRACT
  Filled 2022-12-11 (×3): qty 2

## 2022-12-11 MED ORDER — TRAZODONE HCL 50 MG PO TABS: 25.0000 mg | ORAL_TABLET | Freq: Every evening | ORAL | Status: DC | PRN

## 2022-12-11 MED ORDER — IPRATROPIUM-ALBUTEROL 0.5-2.5 (3) MG/3ML IN SOLN: 3.0000 mL | Freq: Four times a day (QID) | RESPIRATORY_TRACT | Status: DC

## 2022-12-11 MED ORDER — MORPHINE SULFATE (PF) 4 MG/ML IV SOLN
4.0000 mg | Freq: Once | INTRAVENOUS | Status: AC
  Administered 2022-12-11: 4 mg via INTRAVENOUS
  Filled 2022-12-11: qty 1

## 2022-12-11 MED ORDER — ONDANSETRON HCL 4 MG/2ML IJ SOLN
4.0000 mg | Freq: Once | INTRAMUSCULAR | Status: AC
  Administered 2022-12-11: 4 mg via INTRAVENOUS
  Filled 2022-12-11: qty 2

## 2022-12-11 MED ORDER — FOLIC ACID 1 MG PO TABS
1.0000 mg | ORAL_TABLET | Freq: Every day | ORAL | Status: DC
  Administered 2022-12-11 – 2022-12-12 (×2): 1 mg via ORAL
  Filled 2022-12-11 (×2): qty 1

## 2022-12-11 MED ORDER — SODIUM CHLORIDE 0.9 % IV SOLN
2.0000 g | INTRAVENOUS | Status: DC
  Administered 2022-12-11: 2 g via INTRAVENOUS
  Filled 2022-12-11: qty 20

## 2022-12-11 MED ORDER — ACETAMINOPHEN 325 MG PO TABS
650.0000 mg | ORAL_TABLET | Freq: Four times a day (QID) | ORAL | Status: DC | PRN
  Administered 2022-12-11 (×3): 650 mg via ORAL
  Filled 2022-12-11 (×3): qty 2

## 2022-12-11 MED ORDER — ONDANSETRON HCL 4 MG PO TABS: 4.0000 mg | ORAL_TABLET | Freq: Four times a day (QID) | ORAL | Status: DC | PRN

## 2022-12-11 MED ORDER — LACTATED RINGERS IV BOLUS
1000.0000 mL | Freq: Once | INTRAVENOUS | Status: AC
  Administered 2022-12-11: 1000 mL via INTRAVENOUS

## 2022-12-11 MED ORDER — METHYLPREDNISOLONE SODIUM SUCC 40 MG IJ SOLR: 40.0000 mg | Freq: Two times a day (BID) | INTRAMUSCULAR | Status: DC

## 2022-12-11 MED ORDER — ALBUTEROL SULFATE (2.5 MG/3ML) 0.083% IN NEBU: 2.5000 mg | INHALATION_SOLUTION | RESPIRATORY_TRACT | Status: DC | PRN

## 2022-12-11 MED ORDER — SODIUM CHLORIDE 0.9 % IV SOLN
1.0000 g | Freq: Once | INTRAVENOUS | Status: AC
  Administered 2022-12-11: 1 g via INTRAVENOUS
  Filled 2022-12-11: qty 10

## 2022-12-11 MED ORDER — LORAZEPAM 2 MG/ML IJ SOLN
1.0000 mg | INTRAMUSCULAR | Status: DC | PRN
  Administered 2022-12-11: 2 mg via INTRAVENOUS
  Filled 2022-12-11: qty 1

## 2022-12-11 MED ORDER — INSULIN ASPART 100 UNIT/ML IJ SOLN
0.0000 [IU] | Freq: Every day | INTRAMUSCULAR | Status: DC
  Administered 2022-12-11: 2 [IU] via SUBCUTANEOUS

## 2022-12-11 MED ORDER — METHYLPREDNISOLONE SODIUM SUCC 125 MG IJ SOLR: 80.0000 mg | INTRAMUSCULAR | Status: DC

## 2022-12-11 MED ORDER — IPRATROPIUM-ALBUTEROL 0.5-2.5 (3) MG/3ML IN SOLN: 3.0000 mL | Freq: Once | RESPIRATORY_TRACT | Status: DC

## 2022-12-11 MED ORDER — SODIUM CHLORIDE 0.9 % IV SOLN
500.0000 mg | INTRAVENOUS | Status: DC
  Administered 2022-12-12: 500 mg via INTRAVENOUS
  Filled 2022-12-11: qty 5

## 2022-12-11 MED ORDER — LEVALBUTEROL HCL 0.63 MG/3ML IN NEBU
0.6300 mg | INHALATION_SOLUTION | Freq: Three times a day (TID) | RESPIRATORY_TRACT | Status: DC
  Administered 2022-12-11: 0.63 mg via RESPIRATORY_TRACT
  Filled 2022-12-11: qty 3

## 2022-12-11 MED ORDER — ADULT MULTIVITAMIN W/MINERALS CH
1.0000 | ORAL_TABLET | Freq: Every day | ORAL | Status: DC
  Administered 2022-12-11 – 2022-12-12 (×2): 1 via ORAL
  Filled 2022-12-11 (×2): qty 1

## 2022-12-11 MED ORDER — THIAMINE MONONITRATE 100 MG PO TABS
100.0000 mg | ORAL_TABLET | Freq: Every day | ORAL | Status: DC
  Filled 2022-12-11: qty 1

## 2022-12-11 MED ORDER — SODIUM CHLORIDE 0.9 % IV SOLN: INTRAVENOUS | Status: DC

## 2022-12-11 MED ORDER — LORAZEPAM 1 MG PO TABS: 1.0000 mg | ORAL_TABLET | ORAL | Status: DC | PRN

## 2022-12-11 MED ORDER — OXYCODONE HCL 5 MG PO TABS
5.0000 mg | ORAL_TABLET | ORAL | Status: DC | PRN
  Administered 2022-12-11 – 2022-12-12 (×3): 5 mg via ORAL
  Filled 2022-12-11 (×3): qty 1

## 2022-12-11 MED ORDER — THIAMINE MONONITRATE 100 MG PO TABS
100.0000 mg | ORAL_TABLET | Freq: Every day | ORAL | Status: DC
  Administered 2022-12-11 – 2022-12-12 (×2): 100 mg via ORAL
  Filled 2022-12-11 (×2): qty 1

## 2022-12-11 MED ORDER — HYDROCOD POLI-CHLORPHE POLI ER 10-8 MG/5ML PO SUER
5.0000 mL | Freq: Two times a day (BID) | ORAL | Status: DC | PRN
  Administered 2022-12-11: 5 mL via ORAL
  Filled 2022-12-11: qty 5

## 2022-12-11 MED ORDER — DIPHENHYDRAMINE HCL 25 MG PO CAPS
25.0000 mg | ORAL_CAPSULE | Freq: Four times a day (QID) | ORAL | Status: DC | PRN
  Administered 2022-12-11 (×2): 25 mg via ORAL
  Filled 2022-12-11 (×3): qty 1

## 2022-12-11 MED ORDER — IPRATROPIUM-ALBUTEROL 0.5-2.5 (3) MG/3ML IN SOLN: 3.0000 mL | RESPIRATORY_TRACT | Status: DC | PRN

## 2022-12-11 MED ORDER — METOPROLOL TARTRATE 25 MG PO TABS
25.0000 mg | ORAL_TABLET | Freq: Two times a day (BID) | ORAL | Status: DC
  Administered 2022-12-11 – 2022-12-12 (×3): 25 mg via ORAL
  Filled 2022-12-11 (×4): qty 1

## 2022-12-11 MED ORDER — ACETAMINOPHEN 650 MG RE SUPP: 650.0000 mg | Freq: Four times a day (QID) | RECTAL | Status: DC | PRN

## 2022-12-11 MED ORDER — LEVALBUTEROL HCL 0.63 MG/3ML IN NEBU
0.6300 mg | INHALATION_SOLUTION | Freq: Two times a day (BID) | RESPIRATORY_TRACT | Status: DC
  Administered 2022-12-12: 0.63 mg via RESPIRATORY_TRACT
  Filled 2022-12-11: qty 3

## 2022-12-11 MED ORDER — TIOTROPIUM BROMIDE MONOHYDRATE 18 MCG IN CAPS
18.0000 ug | ORAL_CAPSULE | Freq: Every day | RESPIRATORY_TRACT | Status: DC
  Filled 2022-12-11: qty 5

## 2022-12-11 MED ORDER — THIAMINE HCL 100 MG/ML IJ SOLN: 100.0000 mg | Freq: Every day | INTRAMUSCULAR | Status: DC

## 2022-12-11 MED ORDER — ENOXAPARIN SODIUM 40 MG/0.4ML IJ SOSY
40.0000 mg | PREFILLED_SYRINGE | INTRAMUSCULAR | Status: DC
  Administered 2022-12-11: 40 mg via SUBCUTANEOUS
  Filled 2022-12-11: qty 0.4

## 2022-12-11 MED ORDER — SODIUM CHLORIDE 0.9 % IV SOLN
500.0000 mg | Freq: Once | INTRAVENOUS | Status: AC
  Administered 2022-12-11: 500 mg via INTRAVENOUS
  Filled 2022-12-11: qty 5

## 2022-12-11 NOTE — Progress Notes (Signed)
Pt removed IV. Spoke with MD asking to leave IV out until needed; MD approved.

## 2022-12-11 NOTE — TOC Initial Note (Addendum)
Transition of Care (TOC) - Initial/Assessment Note   NCM spoke to daughter [name]   Patient from home.   Receives oxygen through [place]. Last admission TOC Team called [employer] to check home oxygen equipment. Daughter confirmed same was done. However, patient does not have portable oxygen DME. NCM called [facility] and spoke to [place]. [place] checking patient's account. Per [place] patient has the smallest V tanks [employer] carries , they do not have the portable concentrators   Also on lost admission daughter reports patient's cane was lost in the ED. A new cane was ordered for her , however cane is to heavy for her to use. Gilmer Mor is still in the plastic. Per chart cane was ordered through [employer]. Spoke to [place] with [employer] and they can exchange cane for lighter cane.   Daughter declined substance resources at this time ( has them ) .    Discussed above with [name].   She will bring in cane , when she does she will let NCM know and then NCM will Call [facility] with [employer] to exchange  Patient Details  Name: [name] MRN: [id number] Date of Birth: 05-11-1966  Transition of Care [facility]) CM/SW Contact:    Kingsley Plan, RN Phone Number: 12/11/2022, 4:43 PM  Clinical Narrative:                   Expected Discharge Plan: Home/Self Care     Patient Goals and CMS Choice            Expected Discharge Plan and Services                         DME Arranged:  (see note)                    Prior Living Arrangements/Services   Lives with:: Relatives   Do you feel safe going back to the place where you live?: Yes          Current home services: DME    Activities of Daily Living      Permission Sought/Granted      Share Information with NAME: daughter [name]           Emotional Assessment              Admission diagnosis:  SOB (shortness of breath) [R06.02] COPD exacerbation (HCC) [J44.1] Patient Active Problem List   Diagnosis Date Noted    COPD exacerbation (HCC) 12/11/2022   Acute on chronic respiratory failure with hypoxia (HCC) 12/11/2022   Pulmonary nodules 12/11/2022   Type 2 diabetes mellitus without complications (HCC) 12/11/2022   Malnutrition of moderate degree 11/22/2022   Withdrawal symptoms, alcohol (HCC) 11/20/2022   Pneumonia 11/20/2022   Closed fracture of right zygomaticomaxillary complex (HCC) 08/26/2022   Blunt trauma 08/26/2022   Alcohol withdrawal (HCC) 08/25/2022   Seizure-like activity (HCC) 08/23/2022   Cocaine abuse (HCC) 08/23/2022   Closed fracture of left proximal humerus 07/08/2022   Homicidal ideation 07/07/2022   Aggressive behavior 07/05/2022   Hyperbilirubinemia 06/16/2022   Pressure injury of skin 06/16/2022   Pulmonary embolism (HCC) 05/19/2022   Postoperative wound infection 05/04/2022   Thrombocytosis 05/04/2022   Seizure (HCC) 04/17/2022   Memory loss 04/17/2022   SOB (shortness of breath) 04/05/2022   Abnormal EEG 02/12/2022   Constipation 02/12/2022   Vitamin A deficiency 02/12/2022   Vitamin D deficiency 02/12/2022   Vitamin C deficiency 02/12/2022   Iron deficiency  anemia 02/12/2022   GERD (gastroesophageal reflux disease) 02/12/2022   Tobacco abuse 02/12/2022   Protein-calorie malnutrition, severe 01/14/2022   Acute respiratory failure with hypoxia and hypercapnia (HCC) 11/14/2021   Anemia 11/14/2021   Encephalopathy 10/10/2020   Polysubstance (excluding opioids) dependence, daily use (HCC) 08/04/2018   Substance induced mood disorder (HCC) 08/03/2018   Schizoaffective disorder, bipolar type (HCC) 11/13/2017   Polysubstance abuse (HCC) 11/13/2017   Type 2 diabetes mellitus (HCC) 09/06/2015   Essential hypertension 11/04/2011   COPD (chronic obstructive pulmonary disease) (HCC) 09/29/2011   PCP:  Fleet Contras, MD Pharmacy:   [facility] [address] [place] - [address] [place] [address] Phone:  [phone] Fax: [phone]  [facility] [address] [place] [address] Phone: [phone] Fax: [phone]     Social Determinants of Health (SDOH) Social History: SDOH Screenings   Food Insecurity: No Food Insecurity (05/06/2022)  Housing: Low Risk  (05/06/2022)  Transportation Needs: No Transportation Needs (05/06/2022)  Utilities: Not At Risk (05/06/2022)  Alcohol Screen: Medium Risk (08/04/2018)  Social Connections: Moderately Isolated (05/06/2022)  Tobacco Use: High Risk (12/10/2022)   SDOH Interventions:     Readmission Risk Interventions    11/21/2022    1:16 PM 04/28/2022    2:18 PM 04/07/2022   12:41 PM  Readmission Risk Prevention Plan  Transportation Screening Complete Complete Complete  HRI or Home Care Consult   Complete  Social Work Consult for Recovery Care Planning/Counseling   Complete  Palliative Care Screening   Not Applicable  Medication Review [facility]) Referral to Pharmacy Complete Complete  PCP or Specialist appointment within 3-5 days of discharge  Complete   HRI or Home Care Consult Complete Complete   SW Recovery Care/Counseling Consult  Complete   Palliative Care Screening Not Applicable Not Applicable   Skilled Nursing Facility Not Applicable Not Applicable

## 2022-12-11 NOTE — Progress Notes (Signed)
PROGRESS NOTE    [name]  WGN:[id number] DOB: [birth year] DOA: 12/10/2022 PCP: Fleet Contras, MD   Brief Narrative:    [name] is a 57 y.o. African-American female with medical history significant for COPD, chronic back pain, type II diabetes mellitus, anxiety, depression, GERD, hypertension, atrial fibrillation apparently not a candidate for anticoagulation due to fall risk and seizures, hypothyroidism and schizophrenia, who presented to the emergency room with acute onset of shortness of breath over the last few days with associated left posterior chest wall pain without palpitations.  She was admitted for acute on chronic hypoxemic respiratory failure in the setting of acute COPD exacerbation.  She is noted to be quite somnolent this morning.  Assessment & Plan:   Principal Problem:   COPD exacerbation (HCC) Active Problems:   Acute on chronic respiratory failure with hypoxia (HCC)   Pulmonary nodules   Essential hypertension   Polysubstance abuse (HCC)   Anemia   Type 2 diabetes mellitus without complications (HCC)  Assessment and Plan: COPD exacerbation (HCC) - The patient will be admitted to a medically telemetry monitored bed. - We will place the patient IV steroid therapy with IV Solu-Medrol as well as nebulized bronchodilator therapy with duonebs q.i.d. and q.4 hours p.r.n.Marland Kitchen - Mucolytic therapy will be provided with Mucinex and antibiotic therapy with IV Rocephin and Zithromax. - O2 protocol will be followed. - We will continue Spiriva and hold off Symbicort for now.   Acute on chronic respiratory failure with hypoxia (HCC) -This is clearly secondary to #1 and possibly septic emboli. - O2 protocol will be followed.  Acute encephalopathy -No significant CO2 elevation noted, ammonia level pending -Recent admission with significant reaction to Ativan requiring reversal -CT scan as needed  Pulmonary nodules - Labs are suspicious for septic emboli. - The  patient will be placed on IV Rocephin and Zithromax. - We will follow blood cultures-pending   Polysubstance abuse (HCC) - This is including tobacco abuse, alcohol abuse and crack cocaine abuse. - I counseled her for cessation of all.  She will receive further counseling here.   Essential hypertension - We will continue Lopressor.  Anemia - This is fairly stable.  Type 2 diabetes mellitus without complications (HCC) - The patient will place on supplemental coverage with NovoLog. - We will hold off metformin.  History of alcohol abuse -CIWA protocol  DVT prophylaxis:Lovenox Code Status: Full Family Communication: Discussed with daughter on phone 6/20 Disposition Plan:  Status is: Inpatient Remains inpatient appropriate because: Need for ongoing IV medications.    Consultants:  None  Procedures:  None  Antimicrobials:  Anti-infectives (From admission, onward)    Start     Dose/Rate Route Frequency Ordered Stop   12/12/22 0000  cefTRIAXone (ROCEPHIN) 2 g in sodium chloride 0.9 % 100 mL IVPB        2 g 200 mL/hr over 30 Minutes Intravenous Every 24 hours 12/11/22 0225 12/16/22 2359   12/11/22 0300  cefTRIAXone (ROCEPHIN) 1 g in sodium chloride 0.9 % 100 mL IVPB        1 g 200 mL/hr over 30 Minutes Intravenous  Once 12/11/22 0249 12/11/22 0345   12/11/22 0100  cefTRIAXone (ROCEPHIN) 1 g in sodium chloride 0.9 % 100 mL IVPB        1 g 200 mL/hr over 30 Minutes Intravenous  Once 12/11/22 0053 12/11/22 0156   12/11/22 0100  azithromycin (ZITHROMAX) 500 mg in sodium chloride 0.9 % 250 mL IVPB  500 mg 250 mL/hr over 60 Minutes Intravenous  Once 12/11/22 0053 12/11/22 0310       Subjective: Patient seen and evaluated today and is somnolent and unresponsive to questioning.  Objective: Vitals:   12/11/22 0245 12/11/22 0342 12/11/22 0815 12/11/22 0816  BP: (!) 141/88 137/88  (!) 154/103  Pulse: (!) 109 99  87  Resp: (!) 23   19  Temp:  98.2 F (36.8 C)  98.5  F (36.9 C)  TempSrc:  Oral  Oral  SpO2: 98% 96% 93% 92%    Intake/Output Summary (Last 24 hours) at 12/11/2022 1044 Last data filed at 12/11/2022 0650 Gross per 24 hour  Intake 662.18 ml  Output --  Net 662.18 ml   There were no vitals filed for this visit.  Examination:  General exam: Appears unresponsive Respiratory system: Wheezing with congestion bilaterally. Respiratory effort normal.  Nasal cannula. Cardiovascular system: S1 & S2 heard, RRR.  Gastrointestinal system: Abdomen is soft Central nervous system: Unresponsive/somnolent Extremities: No edema Skin: No significant lesions noted Psychiatry: Flat affect.    Data Reviewed: I have personally reviewed following labs and imaging studies  CBC: Recent Labs  Lab 12/05/22 0452 12/05/22 0457 12/10/22 2222 12/11/22 0405  WBC 6.4  --  7.8 7.7  NEUTROABS 3.0  --  6.9  --   HGB 10.0* 11.6* 9.2* 9.2*  HCT 33.6* 34.0* 30.1* 30.9*  MCV 75.3*  --  73.2* 73.2*  PLT 336  --  569* 555*   Basic Metabolic Panel: Recent Labs  Lab 12/05/22 0452 12/05/22 0457 12/10/22 2222 12/11/22 0405  NA 137 140 135 134*  K 4.0 4.0 4.5 4.4  CL 106  --  103 100  CO2 20*  --  21* 25  GLUCOSE 236*  --  235* 255*  BUN <5*  --  8 5*  CREATININE 0.56  --  0.53 0.56  CALCIUM 8.0*  --  8.3* 8.2*   GFR: Estimated Creatinine Clearance: 60.6 mL/min (by C-G formula based on SCr of 0.56 mg/dL). Liver Function Tests: Recent Labs  Lab 12/05/22 0452 12/11/22 0034  AST 39 65*  ALT 24 47*  ALKPHOS 88 114  BILITOT 0.4 0.8  PROT 6.4* 7.1  ALBUMIN 2.7* 3.3*   No results for input(s): "LIPASE", "AMYLASE" in the last 168 hours. No results for input(s): "AMMONIA" in the last 168 hours. Coagulation Profile: No results for input(s): "INR", "PROTIME" in the last 168 hours. Cardiac Enzymes: No results for input(s): "CKTOTAL", "CKMB", "CKMBINDEX", "TROPONINI" in the last 168 hours. BNP (last 3 results) No results for input(s): "PROBNP" in the  last 8760 hours. HbA1C: No results for input(s): "HGBA1C" in the last 72 hours. CBG: Recent Labs  Lab 12/11/22 0817  GLUCAP 130*   Lipid Profile: No results for input(s): "CHOL", "HDL", "LDLCALC", "TRIG", "CHOLHDL", "LDLDIRECT" in the last 72 hours. Thyroid Function Tests: No results for input(s): "TSH", "T4TOTAL", "FREET4", "T3FREE", "THYROIDAB" in the last 72 hours. Anemia Panel: No results for input(s): "VITAMINB12", "FOLATE", "FERRITIN", "TIBC", "IRON", "RETICCTPCT" in the last 72 hours. Sepsis Labs: No results for input(s): "PROCALCITON", "LATICACIDVEN" in the last 168 hours.  No results found for this or any previous visit (from the past 240 hour(s)).       Radiology Studies: DG Chest 2 View  Result Date: 12/10/2022 CLINICAL DATA:  Shortness of breath EXAM: CHEST - 2 VIEW COMPARISON:  Chest x-ray 12/05/2022.  CT of the chest 12/05/2022. FINDINGS: Small focal nodular opacity in the left upper  lobe is unchanged. Band of opacity in the left lower lung is unchanged. Right lung is clear. No pleural effusion or pneumothorax. Cardiomediastinal silhouette is stable, the heart is mildly enlarged. Osseous structures are unchanged. There is compression deformity of the midthoracic vertebral body. IMPRESSION: 1. Unchanged left upper lobe nodular density and linear band of opacity in the left lower lung. Findings are indeterminate and may be infectious/inflammatory. Follow-up recommended to confirm complete resolution. Electronically Signed   By: Darliss Cheney M.D.   On: 12/10/2022 22:48        Scheduled Meds:  budesonide (PULMICORT) nebulizer solution  0.25 mg Nebulization BID   enoxaparin (LOVENOX) injection  40 mg Subcutaneous Q24H   folic acid  1 mg Oral Daily   guaiFENesin  600 mg Oral BID   insulin aspart  0-15 Units Subcutaneous TID WC   insulin aspart  0-5 Units Subcutaneous QHS   levalbuterol  0.63 mg Nebulization Q8H   methylPREDNISolone (SOLU-MEDROL) injection  80 mg  Intravenous Q24H   metoprolol tartrate  25 mg Oral BID   multivitamin with minerals  1 tablet Oral Daily   OLANZapine  10 mg Oral QHS   thiamine  100 mg Oral Daily   Or   thiamine  100 mg Intravenous Daily   umeclidinium bromide  1 puff Inhalation Daily   Continuous Infusions:  [START ON 12/12/2022] cefTRIAXone (ROCEPHIN)  IV       LOS: 0 days    Time spent: 35 minutes    Gerrard Crystal Hoover Brunette, DO [facility]  If 7PM-7AM, please contact night-coverage [email or web address] 12/11/2022, 10:44 AM

## 2022-12-11 NOTE — ED Notes (Signed)
ED TO INPATIENT HANDOFF REPORT  ED Nurse Name and Phone #: Alycia Rossetti [phone]  S Name/Age/Gender [name] 57 y.o. female Room/Bed: 003C/003C  Code Status   Code Status: Full Code  Home/SNF/Other Home Patient oriented to: self, place, time, and situation Is this baseline? Yes   Triage Complete: Triage complete  Chief Complaint COPD exacerbation (HCC) [J44.1]  Triage Note PT BIB [facility] from home c/o chronic back pain and SHOB. Pt is on 3L [place] at baseline. Pt has had heavy ETOH use today and unsure if she smoked crack or not per EMS.    99 3L 193 CBG 110/78 98 20   Allergies Allergies  Allergen Reactions   Iron Dextran Shortness Of Breath and Anxiety   Nsaids Other (See Comments)    H/o gastric bypass - can cause ulcers   Aspirin Nausea And Vomiting and Other (See Comments)    Ok to take tylenol or ibuprofen    Penicillins Other (See Comments)    Unknown. Childhood reaction     Level of Care/Admitting Diagnosis ED Disposition     ED Disposition  Admit   Condition  --   Comment  Hospital Area: [facility] [[id number]]  Level of Care: Telemetry Medical [104]  May admit patient to [facility] or [facility] if equivalent level of care is available:: No  Covid Evaluation: Asymptomatic - no recent exposure (last 10 days) testing not required  Diagnosis: COPD exacerbation [facility]) [[id number]]  Admitting Physician: [name] [[id number]]  Attending Physician: [name] [[id number]]  Certification:: I certify this patient will need inpatient services for at least 2 midnights  Estimated Length of Stay: 2          B Medical/Surgery History Past Medical History:  Diagnosis Date   Abdominal pain    Accidental drug overdose April 2013   Anxiety    Atrial fibrillation (HCC) 09/29/11   converted spontaneously   Chronic back pain    Chronic knee pain    Chronic nausea    Chronic pain    COPD (chronic obstructive pulmonary disease) (HCC)    Depression     Diabetes mellitus    states her doctor took her off all DM meds in past month   Diabetic neuropathy (HCC)    Dyspnea    with exertion    GERD (gastroesophageal reflux disease)    Headache(784.0)    migraines    HTN (hypertension)    not on meds since in a year    Hyperlipidemia    Hypothyroidism    not on meds in a while    Mental disorder    Bipolar and schizophrenic   Requires supplemental oxygen    as needed per patient    Schizophrenia (HCC)    Schizophrenia, acute (HCC) 11/13/2017   Tobacco abuse    Past Surgical History:  Procedure Laterality Date   ABDOMINAL HYSTERECTOMY     BLADDER SUSPENSION  03/04/2011   Procedure: [facility] PROCEDURE;  Surgeon: Jonna Coup MacDiarmid;  Location: [facility] ORS;  Service: Urology;  Laterality: N/A;   BOWEL RESECTION N/A 04/18/2022   Procedure: SMALL BOWEL RESECTION;  Surgeon: Harriette Bouillon, MD;  Location: [facility] OR;  Service: General;  Laterality: N/A;   CYSTOCELE REPAIR  03/04/2011   Procedure: ANTERIOR REPAIR (CYSTOCELE);  Surgeon: Martina Sinner;  Location: [facility];  Service: Urology;  Laterality: N/A;   CYSTOSCOPY  03/04/2011   Procedure: CYSTOSCOPY;  Surgeon: Jonna Coup MacDiarmid;  Location: [facility]  ORS;  Service: Urology;  Laterality: N/A;   ESOPHAGOGASTRODUODENOSCOPY (EGD) WITH PROPOFOL N/A 05/12/2017   Procedure: ESOPHAGOGASTRODUODENOSCOPY (EGD) WITH PROPOFOL;  Surgeon: Ovidio Kin, MD;  Location: [facility];  Service: General;  Laterality: N/A;   GASTRIC ROUX-EN-Y N/A 03/25/2016   Procedure: LAPAROSCOPIC ROUX-EN-Y GASTRIC BYPASS WITH UPPER ENDOSCOPY;  Surgeon: Glenna Fellows, MD;  Location: [facility];  Service: General;  Laterality: N/A;   KNEE SURGERY     LAPAROSCOPIC ASSISTED VAGINAL HYSTERECTOMY  03/04/2011   Procedure: LAPAROSCOPIC ASSISTED VAGINAL HYSTERECTOMY;  Surgeon: Jeani Hawking, MD;  Location: [facility];  Service: Gynecology;  Laterality: N/A;   LAPAROTOMY N/A 04/18/2022   Procedure: EXPLORATORY LAPAROTOMY;  Surgeon: Harriette Bouillon, MD;  Location: [facility] OR;  Service: General;  Laterality: N/A;   LAPAROTOMY N/A 04/24/2022   Procedure: BRING BACK EXPLORATORY LAPAROTOMY;  Surgeon: Violeta Gelinas, MD;  Location: [facility] OR;  Service: General;  Laterality: N/A;   TOTAL HIP ARTHROPLASTY Right 08/27/2022   Procedure: TOTAL HIP ARTHROPLASTY;  Surgeon: Joen Laura, MD;  Location: [facility] OR;  Service: Orthopedics;  Laterality: Right;     A IV Location/Drains/Wounds Patient Lines/Drains/Airways Status     Active Line/Drains/Airways     Name Placement date Placement time Site Days   Peripheral IV 12/11/22 20 G Right;Posterior Hand 12/11/22  0123  Hand  less than 1            Intake/Output Last 24 hours  Intake/Output Summary (Last 24 hours) at 12/11/2022 0229 Last data filed at 12/11/2022 0156 Gross per 24 hour  Intake 100 ml  Output --  Net 100 ml    Labs/Imaging Results for orders placed or performed during the hospital encounter of 12/10/22 (from the past 48 hour(s))  Basic metabolic panel     Status: Abnormal   Collection Time: 12/10/22 10:22 PM  Result Value Ref Range   Sodium 135 135 - 145 mmol/L   Potassium 4.5 3.5 - 5.1 mmol/L   Chloride 103 98 - 111 mmol/L   CO2 21 (L) 22 - 32 mmol/L   Glucose, Bld 235 (H) 70 - 99 mg/dL    Comment: Glucose reference range applies only to samples taken after fasting for at least 8 hours.   BUN 8 6 - 20 mg/dL   Creatinine, Ser 7.84 0.44 - 1.00 mg/dL   Calcium 8.3 (L) 8.9 - 10.3 mg/dL   GFR, Estimated >69 >62 mL/min    Comment: (NOTE) Calculated using the CKD-EPI Creatinine Equation (2021)    Anion gap 11 5 - 15    Comment: Performed at [facility], [address], [place], [place] [address]  CBC with Differential     Status: Abnormal   Collection Time: 12/10/22 10:22 PM  Result Value Ref Range   WBC 7.8 4.0 - 10.5 K/uL   RBC 4.11 3.87 - 5.11 MIL/uL   Hemoglobin 9.2 (L) 12.0 - 15.0 g/dL   HCT 13.2 (L) 44.0 - 10.2 %   MCV 73.2 (L) 80.0 - 100.0 fL    MCH 22.4 (L) 26.0 - 34.0 pg   MCHC 30.6 30.0 - 36.0 g/dL   RDW 72.5 (H) 36.6 - 44.0 %   Platelets 569 (H) 150 - 400 K/uL   nRBC 0.0 0.0 - 0.2 %   Neutrophils Relative % 91 %   Neutro Abs 6.9 1.7 - 7.7 K/uL   Lymphocytes Relative 7 %   Lymphs Abs 0.6 (L) 0.7 - 4.0 K/uL   Monocytes Relative 1 %  Monocytes Absolute 0.1 0.1 - 1.0 K/uL   Eosinophils Relative 0 %   Eosinophils Absolute 0.0 0.0 - 0.5 K/uL   Basophils Relative 0 %   Basophils Absolute 0.0 0.0 - 0.1 K/uL   Immature Granulocytes 1 %   Abs Immature Granulocytes 0.11 (H) 0.00 - 0.07 K/uL    Comment: Performed at [facility], [address], [place], [place] [address]  Troponin I (High Sensitivity)     Status: None   Collection Time: 12/10/22 10:22 PM  Result Value Ref Range   Troponin I (High Sensitivity) 4 <18 ng/L    Comment: (NOTE) Elevated high sensitivity troponin I (hsTnI) values and significant  changes across serial measurements may suggest ACS but many other  chronic and acute conditions are known to elevate hsTnI results.  Refer to the "Links" section for chest pain algorithms and additional  guidance. Performed at [facility], [address], [place], [place] [address]   Troponin I (High Sensitivity)     Status: None   Collection Time: 12/11/22 12:34 AM  Result Value Ref Range   Troponin I (High Sensitivity) 4 <18 ng/L    Comment: (NOTE) Elevated high sensitivity troponin I (hsTnI) values and significant  changes across serial measurements may suggest ACS but many other  chronic and acute conditions are known to elevate hsTnI results.  Refer to the "Links" section for chest pain algorithms and additional  guidance. Performed at [facility], [address], [place], [place] [address]   Hepatic function panel     Status: Abnormal   Collection Time: 12/11/22 12:34 AM  Result Value Ref Range   Total Protein 7.1 6.5 - 8.1 g/dL   Albumin 3.3 (L) 3.5 - 5.0 g/dL   AST 65 (H) 15 - 41 U/L    ALT 47 (H) 0 - 44 U/L   Alkaline Phosphatase 114 38 - 126 U/L   Total Bilirubin 0.8 0.3 - 1.2 mg/dL   Bilirubin, Direct 0.1 0.0 - 0.2 mg/dL   Indirect Bilirubin 0.7 0.3 - 0.9 mg/dL    Comment: Performed at [facility], [address], [place], [place] [address]   DG Chest 2 View  Result Date: 12/10/2022 CLINICAL DATA:  Shortness of breath EXAM: CHEST - 2 VIEW COMPARISON:  Chest x-ray 12/05/2022.  CT of the chest 12/05/2022. FINDINGS: Small focal nodular opacity in the left upper lobe is unchanged. Band of opacity in the left lower lung is unchanged. Right lung is clear. No pleural effusion or pneumothorax. Cardiomediastinal silhouette is stable, the heart is mildly enlarged. Osseous structures are unchanged. There is compression deformity of the midthoracic vertebral body. IMPRESSION: 1. Unchanged left upper lobe nodular density and linear band of opacity in the left lower lung. Findings are indeterminate and may be infectious/inflammatory. Follow-up recommended to confirm complete resolution. Electronically Signed   By: Darliss Cheney M.D.   On: 12/10/2022 22:48    Pending Labs Unresulted Labs (From admission, onward)     Start     Ordered   12/11/22 0500  Basic metabolic panel  Tomorrow morning,   R        12/11/22 0225   12/11/22 0500  CBC  Tomorrow morning,   R        12/11/22 0225   12/11/22 0055  Blood culture (routine x 2)  BLOOD CULTURE X 2,   R (with STAT occurrences)      12/11/22 2956  Vitals/Pain Today's Vitals   12/11/22 0003 12/11/22 0045 12/11/22 0146 12/11/22 0204  BP: (!) 146/116 135/83 (!) 141/87   Pulse: (!) 105  (!) 107   Resp: 19  18   Temp:    98.2 F (36.8 C)  TempSrc:    Oral  SpO2: 100%  99%   PainSc:        Isolation Precautions No active isolations  Medications Medications  albuterol (VENTOLIN HFA) 108 (90 Base) MCG/ACT inhaler 2 puff (has no administration in time range)  azithromycin (ZITHROMAX) 500 mg in sodium chloride  0.9 % 250 mL IVPB (500 mg Intravenous New Bag/Given 12/11/22 0206)  ipratropium-albuterol (DUONEB) 0.5-2.5 (3) MG/3ML nebulizer solution 3 mL (0 mLs Nebulization Hold 12/11/22 0128)  lactated ringers bolus 1,000 mL (has no administration in time range)  metoprolol tartrate (LOPRESSOR) tablet 25 mg (has no administration in time range)  OLANZapine (ZYPREXA) tablet 10 mg (has no administration in time range)  thiamine (VITAMIN B1) tablet 100 mg (has no administration in time range)  albuterol (VENTOLIN HFA) 108 (90 Base) MCG/ACT inhaler 2 puff (has no administration in time range)  tiotropium (SPIRIVA) inhalation capsule ([facility] use ONLY) 18 mcg (has no administration in time range)  cefTRIAXone (ROCEPHIN) 2 g in sodium chloride 0.9 % 100 mL IVPB (has no administration in time range)  enoxaparin (LOVENOX) injection 40 mg (has no administration in time range)  0.9 %  sodium chloride infusion (has no administration in time range)  acetaminophen (TYLENOL) tablet 650 mg (has no administration in time range)    Or  acetaminophen (TYLENOL) suppository 650 mg (has no administration in time range)  traZODone (DESYREL) tablet 25 mg (has no administration in time range)  magnesium hydroxide (MILK OF MAGNESIA) suspension 30 mL (has no administration in time range)  ondansetron (ZOFRAN) tablet 4 mg (has no administration in time range)    Or  ondansetron (ZOFRAN) injection 4 mg (has no administration in time range)  ipratropium-albuterol (DUONEB) 0.5-2.5 (3) MG/3ML nebulizer solution 3 mL (has no administration in time range)  guaiFENesin (MUCINEX) 12 hr tablet 600 mg (has no administration in time range)  chlorpheniramine-HYDROcodone (TUSSIONEX) 10-8 MG/5ML suspension 5 mL (has no administration in time range)  insulin aspart (novoLOG) injection 0-15 Units (has no administration in time range)  insulin aspart (novoLOG) injection 0-5 Units (has no administration in time range)  cefTRIAXone (ROCEPHIN) 1 g in  sodium chloride 0.9 % 100 mL IVPB (0 g Intravenous Stopped 12/11/22 0156)  morphine (PF) 4 MG/ML injection 4 mg (4 mg Intravenous Given 12/11/22 0227)  ondansetron (ZOFRAN) injection 4 mg (4 mg Intravenous Given 12/11/22 0227)    Mobility walks     Focused Assessments    R Recommendations: See Admitting Provider Note  Report given to:   Additional Notes: Pt Is alert, oriented x4, ambulatory.

## 2022-12-11 NOTE — Telephone Encounter (Signed)
Transition Care Management Unsuccessful Follow-up Telephone Call  Date of discharge and from where:  [facility] 12/05/2022  Attempts:  2nd Attempt  Reason for unsuccessful TCM follow-up call:  No answer/busy   Patient currently admitted

## 2022-12-11 NOTE — H&P (Addendum)
[name]   PATIENT NAME: [name]    MR#:  [id number]  DATE OF BIRTH:  1965/08/25  DATE OF ADMISSION:  12/10/2022  PRIMARY CARE PHYSICIAN: Fleet Contras, MD   Patient is coming from: Home  REQUESTING/REFERRING PHYSICIAN:  Sponseller, Eugene Gavia, PA-C  CHIEF COMPLAINT:   Chief Complaint  Patient presents with   Shortness of Breath    HISTORY OF PRESENT ILLNESS:  [name] is a 57 y.o. African-American female with medical history significant for COPD, chronic back pain, type II diabetes mellitus, anxiety, depression, GERD, hypertension, atrial fibrillation apparently not a candidate for anticoagulation due to fall risk and seizures, hypothyroidism and schizophrenia, who presented to the emergency room with acute onset of shortness of breath over the last few days with associated left posterior chest wall pain without palpitations.  Her pain has been increasing with deep breathing.  She admitted to dry cough with associated wheezing with her dyspnea.  No fever or chills.  No nausea or vomiting or abdominal pain.  No bleeding diathesis.  No dysuria, oliguria or hematuria or flank pain.  She continues to smoke 1 pack of cigarettes per day and drinks 40 ounces of beer twice daily and occasionally more.  She continues to smoke crack cocaine..  ED Course: Upon presentation to the emergency room, BP was 113/95 with a heart rate of 102 pulse oximetry of 97% on 3 L of O2 by nasal cannula.  Labs revealed hyperglycemia of 235 and a CO2 of 21 with calcium of 8.3 and albumin 3.3, AST 65 and ALT 47 with otherwise unremarkable CMP.  CBC showed hemoglobin 9.2 hematocrit 30.1 compared to 10/33.6 yesterday.  It showed thrombocytosis of 569 compared to 336 then. EKG as reviewed by me : Normal sinus rhythm with a rate of 99 with possible left atrial enlargement. Imaging: 2 view chest x-ray showed unchanged left upper lobe nodular density and linear band of opacity in the left lower lobe.   Findings are indeterminate and may be infectious/inflammatory with recommendation for follow-up to confirm resolution. CTA chest on 6/14 revealed the following: 1. No evidence of pulmonary embolism. 2. Unusual new areas of airspace consolidation, many of which are nodular in appearance, as above, presumably indicative of an acute infectious or inflammatory process. Given the nodular appearance of several of these regions, the possibility of septic emboli should be considered. 3. Dilatation of the pulmonic trunk (3.6 cm in diameter), concerning for pulmonary arterial hypertension. 4. Aortic atherosclerosis, in addition to left main and 2 vessel coronary artery disease. Please note that although the presence of coronary artery calcium documents the presence of coronary artery disease, the severity of this disease and any potential stenosis cannot be assessed on this non-gated CT examination. Assessment for potential risk factor modification, dietary therapy or pharmacologic therapy may be warranted, if clinically indicated. 5. Mild paraseptal emphysema. 6.  Aortic atherosclerosis and emphysema.  The patient was given IV Rocephin and Zithromax, 4 mg of IV morphine sulfate and 4 mg of IV Zofran as well as albuterol MDI.  She will be admitted to a medical telemetry bed for further evaluation and management. PAST MEDICAL HISTORY:   Past Medical History:  Diagnosis Date   Abdominal pain    Accidental drug overdose April 2013   Anxiety    Atrial fibrillation (HCC) 09/29/11   converted spontaneously   Chronic back pain    Chronic knee pain    Chronic nausea    Chronic  pain    COPD (chronic obstructive pulmonary disease) (HCC)    Depression    Diabetes mellitus    states her doctor took her off all DM meds in past month   Diabetic neuropathy (HCC)    Dyspnea    with exertion    GERD (gastroesophageal reflux disease)    Headache(784.0)    migraines    HTN (hypertension)    not on meds  since in a year    Hyperlipidemia    Hypothyroidism    not on meds in a while    Mental disorder    Bipolar and schizophrenic   Requires supplemental oxygen    as needed per patient    Schizophrenia (HCC)    Schizophrenia, acute (HCC) 11/13/2017   Tobacco abuse     PAST SURGICAL HISTORY:   Past Surgical History:  Procedure Laterality Date   ABDOMINAL HYSTERECTOMY     BLADDER SUSPENSION  03/04/2011   Procedure: [facility] PROCEDURE;  Surgeon: Jonna Coup MacDiarmid;  Location: [facility];  Service: Urology;  Laterality: N/A;   BOWEL RESECTION N/A 04/18/2022   Procedure: SMALL BOWEL RESECTION;  Surgeon: Harriette Bouillon, MD;  Location: [facility] OR;  Service: General;  Laterality: N/A;   CYSTOCELE REPAIR  03/04/2011   Procedure: ANTERIOR REPAIR (CYSTOCELE);  Surgeon: Martina Sinner;  Location: [facility];  Service: Urology;  Laterality: N/A;   CYSTOSCOPY  03/04/2011   Procedure: CYSTOSCOPY;  Surgeon: Jonna Coup MacDiarmid;  Location: [facility];  Service: Urology;  Laterality: N/A;   ESOPHAGOGASTRODUODENOSCOPY (EGD) WITH PROPOFOL N/A 05/12/2017   Procedure: ESOPHAGOGASTRODUODENOSCOPY (EGD) WITH PROPOFOL;  Surgeon: Ovidio Kin, MD;  Location: [facility];  Service: General;  Laterality: N/A;   GASTRIC ROUX-EN-Y N/A 03/25/2016   Procedure: LAPAROSCOPIC ROUX-EN-Y GASTRIC BYPASS WITH UPPER ENDOSCOPY;  Surgeon: Glenna Fellows, MD;  Location: [facility];  Service: General;  Laterality: N/A;   KNEE SURGERY     LAPAROSCOPIC ASSISTED VAGINAL HYSTERECTOMY  03/04/2011   Procedure: LAPAROSCOPIC ASSISTED VAGINAL HYSTERECTOMY;  Surgeon: Jeani Hawking, MD;  Location: [facility];  Service: Gynecology;  Laterality: N/A;   LAPAROTOMY N/A 04/18/2022   Procedure: EXPLORATORY LAPAROTOMY;  Surgeon: Harriette Bouillon, MD;  Location: [facility];  Service: General;  Laterality: N/A;   LAPAROTOMY N/A 04/24/2022   Procedure: BRING BACK EXPLORATORY LAPAROTOMY;  Surgeon: Violeta Gelinas, MD;  Location: [facility];  Service: General;  Laterality: N/A;    TOTAL HIP ARTHROPLASTY Right 08/27/2022   Procedure: TOTAL HIP ARTHROPLASTY;  Surgeon: Joen Laura, MD;  Location: [facility] OR;  Service: Orthopedics;  Laterality: Right;    SOCIAL HISTORY:   Social History   Tobacco Use   Smoking status: Every Day    Packs/day: 1    Types: Cigarettes   Smokeless tobacco: Never  Substance Use Topics   Alcohol use: Yes    Comment: 40 ounces daily    FAMILY HISTORY:   Family History  Problem Relation Age of Onset   Heart attack Father        35s   Diabetes Mother    Heart disease Mother    Hypertension Mother    Heart attack Sister        66   COPD Other    Breast cancer Neg Hx     DRUG ALLERGIES:   Allergies  Allergen Reactions   Iron Dextran Shortness Of Breath and Anxiety   Aspirin Nausea And Vomiting and Other (See Comments)    Ok to take tylenol or ibuprofen  Penicillins Other (See Comments)    Unknown. Childhood reaction     REVIEW OF SYSTEMS:   ROS As per history of present illness. All pertinent systems were reviewed above. Constitutional, HEENT, cardiovascular, respiratory, GI, GU, musculoskeletal, neuro, psychiatric, endocrine, integumentary and hematologic systems were reviewed and are otherwise negative/unremarkable except for positive findings mentioned above in the HPI.   MEDICATIONS AT HOME:   Prior to Admission medications   Medication Sig Start Date End Date Taking? Authorizing Provider  albuterol (VENTOLIN HFA) 108 (90 Base) MCG/ACT inhaler Inhale 2 puffs into the lungs every 4 (four) hours as needed for wheezing or shortness of breath. 12/01/22   Ghimire, Werner Lean, MD  budesonide-formoterol (SYMBICORT) 160-4.5 MCG/ACT inhaler Inhale 2 puffs into the lungs 2 (two) times daily. 12/01/22   Ghimire, Werner Lean, MD  doxycycline (VIBRAMYCIN) 100 MG capsule Take 1 capsule (100 mg total) by mouth 2 (two) times daily for 7 days. 12/05/22 12/12/22  Mardene Sayer, MD  doxycycline (VIBRAMYCIN) 100 MG capsule Take 1  capsule (100 mg total) by mouth 2 (two) times daily for 7 days. 12/05/22 12/12/22  Terald Sleeper, MD  metFORMIN (GLUCOPHAGE) 500 MG tablet Take 1 tablet (500 mg total) by mouth 2 (two) times daily with a meal. 12/01/22   Ghimire, Werner Lean, MD  metoprolol tartrate (LOPRESSOR) 25 MG tablet Take 1 tablet (25 mg total) by mouth 2 (two) times daily. 12/01/22   Ghimire, Werner Lean, MD  OLANZapine (ZYPREXA) 10 MG tablet Take 1 tablet (10 mg total) by mouth at bedtime. 12/01/22   Ghimire, Werner Lean, MD  thiamine (VITAMIN B1) 100 MG tablet Take 1 tablet (100 mg total) by mouth daily. 12/02/22   Ghimire, Werner Lean, MD  tiotropium (SPIRIVA HANDIHALER) 18 MCG inhalation capsule Place 1 capsule (18 mcg total) into inhaler and inhale daily. 12/01/22 12/01/23  Ghimire, Werner Lean, MD      VITAL SIGNS:  Blood pressure 137/88, pulse 99, temperature 98.2 F (36.8 C), temperature source Oral, resp. rate (!) 23, last menstrual period 01/08/2011, SpO2 96 %.  PHYSICAL EXAMINATION:  Physical Exam  GENERAL:  57 y.o.-year-old African-American female patient lying in the bed with no acute distress.  EYES: Pupils equal, round, reactive to light and accommodation. No scleral icterus. Extraocular muscles intact.  HEENT: Head atraumatic, normocephalic. Oropharynx and nasopharynx clear.  NECK:  Supple, no jugular venous distention. No thyroid enlargement, no tenderness.  LUNGS: Slightly diminished bibasal breath sounds with diffuse expiratory wheezes and tight expiratory airflow with harsh vesicular breathing.  No use of accessory muscles of respiration.  CARDIOVASCULAR: Regular rate and rhythm, S1, S2 normal. No murmurs, rubs, or gallops.  ABDOMEN: Soft, nondistended, nontender. Bowel sounds present. No organomegaly or mass.  EXTREMITIES: No pedal edema, cyanosis, or clubbing.  NEUROLOGIC: Cranial nerves II through XII are intact. Muscle strength 5/5 in all extremities. Sensation intact. Gait not checked.  PSYCHIATRIC: The  patient is alert and oriented x 3.  Normal affect and good eye contact. SKIN: No obvious rash, lesion, or ulcer.   LABORATORY PANEL:   CBC Recent Labs  Lab 12/11/22 0405  WBC 7.7  HGB 9.2*  HCT 30.9*  PLT 555*   ------------------------------------------------------------------------------------------------------------------  Chemistries  Recent Labs  Lab 12/10/22 2222 12/11/22 0034  NA 135  --   K 4.5  --   CL 103  --   CO2 21*  --   GLUCOSE 235*  --   BUN 8  --   CREATININE 0.53  --  CALCIUM 8.3*  --   AST  --  65*  ALT  --  47*  ALKPHOS  --  114  BILITOT  --  0.8   ------------------------------------------------------------------------------------------------------------------  Cardiac Enzymes No results for input(s): "TROPONINI" in the last 168 hours. ------------------------------------------------------------------------------------------------------------------  RADIOLOGY:  DG Chest 2 View  Result Date: 12/10/2022 CLINICAL DATA:  Shortness of breath EXAM: CHEST - 2 VIEW COMPARISON:  Chest x-ray 12/05/2022.  CT of the chest 12/05/2022. FINDINGS: Small focal nodular opacity in the left upper lobe is unchanged. Band of opacity in the left lower lung is unchanged. Right lung is clear. No pleural effusion or pneumothorax. Cardiomediastinal silhouette is stable, the heart is mildly enlarged. Osseous structures are unchanged. There is compression deformity of the midthoracic vertebral body. IMPRESSION: 1. Unchanged left upper lobe nodular density and linear band of opacity in the left lower lung. Findings are indeterminate and may be infectious/inflammatory. Follow-up recommended to confirm complete resolution. Electronically Signed   By: Darliss Cheney M.D.   On: 12/10/2022 22:48      IMPRESSION AND PLAN:  Assessment and Plan: * COPD exacerbation (HCC) - The patient will be admitted to a medically telemetry monitored bed. - We will place the patient IV steroid  therapy with IV Solu-Medrol as well as nebulized bronchodilator therapy with duonebs q.i.d. and q.4 hours p.r.n.Marland Kitchen - Mucolytic therapy will be provided with Mucinex and antibiotic therapy with IV Rocephin and Zithromax. - O2 protocol will be followed. - We will continue Spiriva and hold off Symbicort for now.   Acute on chronic respiratory failure with hypoxia (HCC) -This is clearly secondary to #1 and possibly septic emboli. - O2 protocol will be followed.  Pulmonary nodules - Labs are suspicious for septic emboli. - The patient will be placed on IV Rocephin and Zithromax. - We will follow blood cultures.   Polysubstance abuse (HCC) - This is including tobacco abuse, alcohol abuse and crack cocaine abuse. - I counseled her for cessation of all.  She will receive further counseling here.   Essential hypertension - We will continue Lopressor.  Anemia - This is fairly stable.  Type 2 diabetes mellitus without complications (HCC) - The patient will place on supplemental coverage with NovoLog. - We will hold off metformin.   DVT prophylaxis: Lovenox.  Advanced Care Planning:  Code Status: full code.  Family Communication:  The plan of care was discussed in details with the patient (and family). I answered all questions. The patient agreed to proceed with the above mentioned plan. Further management will depend upon hospital course. Disposition Plan: Back to previous home environment Consults called: none.  All the records are reviewed and case discussed with ED provider.  Status is: Inpatient At the time of the admission, it appears that the appropriate admission status for this patient is inpatient.  This is judged to be reasonable and necessary in order to provide the required intensity of service to ensure the patient's safety given the presenting symptoms, physical exam findings and initial radiographic and laboratory data in the context of comorbid conditions.  The patient  requires inpatient status due to high intensity of service, high risk of further deterioration and high frequency of surveillance required.  I certify that at the time of admission, it is my clinical judgment that the patient will require inpatient hospital care extending more than 2 midnights.  Dispo: The patient is from: Home              Anticipated d/c is to: Home              Patient currently is not medically stable to d/c.              Difficult to place patient: No  [name] M.D on 12/11/2022 at 4:46 AM  [facility]   From 7 PM-7 AM, contact night-coverage [email or web address]  CC: Primary care physician; Fleet Contras, MD

## 2022-12-11 NOTE — Assessment & Plan Note (Signed)
-   The patient will place on supplemental coverage with NovoLog. - We will hold off metformin.

## 2022-12-11 NOTE — Progress Notes (Signed)
Pt complaining of pain, gave tylenol, but still complaining. MD ordered oxycodone to deal with pain. MD prefers ativan only to be given if pt is combative, which she is not. Will hold ativan for CIWA score unless pt is combative.

## 2022-12-11 NOTE — Assessment & Plan Note (Signed)
-   This is fairly stable.

## 2022-12-11 NOTE — Assessment & Plan Note (Signed)
-  This is clearly secondary to #1 and possibly septic emboli. - O2 protocol will be followed.

## 2022-12-11 NOTE — ED Notes (Signed)
Report called to receiving RN pt to floor via er staff

## 2022-12-11 NOTE — Assessment & Plan Note (Signed)
-   Labs are suspicious for septic emboli. - The patient will be placed on IV Rocephin and Zithromax. - We will follow blood cultures.

## 2022-12-11 NOTE — Assessment & Plan Note (Signed)
-   We will continue Lopressor. 

## 2022-12-11 NOTE — Progress Notes (Signed)
Mobility Specialist Progress Note:    12/11/22 1446  Mobility  Activity Ambulated with assistance in room  Level of Assistance Minimal assist, patient does 75% or more  Assistive Device Other (Comment) (HHA)  Distance Ambulated (ft) 6 ft  Activity Response Tolerated well  Mobility Referral Yes  $Mobility charge 1 Mobility  Mobility Specialist Start Time (ACUTE ONLY) 1437  Mobility Specialist Stop Time (ACUTE ONLY) 1446  Mobility Specialist Time Calculation (min) (ACUTE ONLY) 9 min   Pt received ambulating in room, she had activated her bed alarm. Needed encouragements and redirections to get back to bed. Pt c/o pain, no ratings given. Assisted back to bed w/ call bell in hand and bed alarm on. RN notified.   Thompson Grayer Mobility Specialist  Please contact vis Secure Chat or  Rehab Office [phone]

## 2022-12-11 NOTE — Assessment & Plan Note (Addendum)
-   The patient will be admitted to a medically telemetry monitored bed. - We will place the patient IV steroid therapy with IV Solu-Medrol as well as nebulized bronchodilator therapy with duonebs q.i.d. and q.4 hours p.r.n.Marland Kitchen - Mucolytic therapy will be provided with Mucinex and antibiotic therapy with IV Rocephin and Zithromax. - O2 protocol will be followed. - We will continue Spiriva and hold off Symbicort for now.

## 2022-12-11 NOTE — ED Notes (Signed)
Patient stated to this RN that she fell the other day at home and is experiencing pain and soreness to the left thigh area. This RN noticed bruising to the L thigh area.

## 2022-12-11 NOTE — Assessment & Plan Note (Signed)
-   This is including tobacco abuse, alcohol abuse and crack cocaine abuse. - I counseled her for cessation of all.  She will receive further counseling here.

## 2022-12-12 ENCOUNTER — Other Ambulatory Visit (HOSPITAL_COMMUNITY): Payer: Self-pay

## 2022-12-12 DIAGNOSIS — J441 Chronic obstructive pulmonary disease with (acute) exacerbation: Secondary | ICD-10-CM | POA: Diagnosis not present

## 2022-12-12 LAB — CBC
HCT: 32.5 % — ABNORMAL LOW (ref 36.0–46.0)
Hemoglobin: 10.2 g/dL — ABNORMAL LOW (ref 12.0–15.0)
MCH: 23.6 pg — ABNORMAL LOW (ref 26.0–34.0)
MCHC: 31.4 g/dL (ref 30.0–36.0)
MCV: 75.1 fL — ABNORMAL LOW (ref 80.0–100.0)
Platelets: 424 10*3/uL — ABNORMAL HIGH (ref 150–400)
RBC: 4.33 MIL/uL (ref 3.87–5.11)
RDW: 20.2 % — ABNORMAL HIGH (ref 11.5–15.5)
WBC: 8 10*3/uL (ref 4.0–10.5)
nRBC: 0 % (ref 0.0–0.2)

## 2022-12-12 LAB — BASIC METABOLIC PANEL
Anion gap: 9 (ref 5–15)
BUN: 11 mg/dL (ref 6–20)
CO2: 26 mmol/L (ref 22–32)
Calcium: 8 mg/dL — ABNORMAL LOW (ref 8.9–10.3)
Chloride: 102 mmol/L (ref 98–111)
Creatinine, Ser: 0.69 mg/dL (ref 0.44–1.00)
GFR, Estimated: >60 mL/min (ref >60)
Glucose, Bld: 187 mg/dL — ABNORMAL HIGH (ref 70–99)
Potassium: 4.6 mmol/L (ref 3.5–5.1)
Sodium: 137 mmol/L (ref 135–145)

## 2022-12-12 LAB — MAGNESIUM: Magnesium: 1.9 mg/dL (ref 1.7–2.4)

## 2022-12-12 LAB — GLUCOSE, CAPILLARY: Glucose-Capillary: 147 mg/dL — ABNORMAL HIGH (ref 70–99)

## 2022-12-12 LAB — CULTURE, BLOOD (ROUTINE X 2): Culture: NO GROWTH

## 2022-12-12 MED ORDER — GUAIFENESIN ER 600 MG PO TB12
600.0000 mg | ORAL_TABLET | Freq: Two times a day (BID) | ORAL | 0 refills | Status: AC
  Filled 2022-12-12: qty 20, 10d supply, fill #0

## 2022-12-12 MED ORDER — PREDNISONE 10 MG PO TABS
40.0000 mg | ORAL_TABLET | Freq: Every day | ORAL | 0 refills | Status: DC
  Filled 2022-12-12: qty 20, 5d supply, fill #0

## 2022-12-12 MED ORDER — ZIPRASIDONE MESYLATE 20 MG IM SOLR
10.0000 mg | Freq: Once | INTRAMUSCULAR | Status: AC
  Administered 2022-12-12: 10 mg via INTRAMUSCULAR
  Filled 2022-12-12: qty 20

## 2022-12-12 MED ORDER — COMBIVENT RESPIMAT 20-100 MCG/ACT IN AERS
1.0000 | INHALATION_SPRAY | Freq: Four times a day (QID) | RESPIRATORY_TRACT | 1 refills | Status: DC | PRN
  Filled 2022-12-12: qty 4, 30d supply, fill #0

## 2022-12-12 NOTE — Progress Notes (Addendum)
Restraint order was placed 6/21, but restraints were never placed. Order discontinued.  CIWA=6, not ativan given per MD

## 2022-12-12 NOTE — Discharge Summary (Signed)
Physician Discharge Summary  [name] QIH:[id number] DOB: [birth year] DOA: 12/10/2022  PCP: Fleet Contras, MD  Admit date: 12/10/2022  Discharge date: 12/12/2022  Admitted From:Home  Disposition:  Home  Recommendations for Outpatient Follow-up:  Follow up with PCP in 1-2 weeks Continue on prednisone as prescribed for 5 more days Continue antitussives and breathing treatments as needed for symptomatic management Counseled on alcohol cessation as well as cocaine use cessation  Home Health: None  Equipment/Devices: Has home oxygen  Discharge Condition:Stable  CODE STATUS: Full  Diet recommendation: Heart Healthy/carb modified  Brief/Interim Summary: [name] is a 57 y.o. African-American female with medical history significant for COPD, chronic back pain, type II diabetes mellitus, anxiety, depression, GERD, hypertension, atrial fibrillation apparently not a candidate for anticoagulation due to fall risk and seizures, hypothyroidism and schizophrenia, who presented to the emergency room with acute onset of shortness of breath over the last few days with associated left posterior chest wall pain without palpitations.  She was admitted for acute on chronic hypoxemic respiratory failure in the setting of acute COPD exacerbation.  She was initially noted to be somewhat somnolent, but quickly became more responsive over the course of her stay.  She is no longer symptomatic and is on her baseline level of home oxygen and is eager for discharge.  She has been counseled on cessation of alcohol use as well as cocaine use, but remains a high risk for readmission.  No other acute events or concerns noted.  Discharge Diagnoses:  Principal Problem:   COPD exacerbation (HCC) Active Problems:   Acute on chronic respiratory failure with hypoxia (HCC)   Pulmonary nodules   Essential hypertension   Polysubstance abuse (HCC)   Anemia   Type 2 diabetes mellitus without complications  (HCC)  Principal discharge diagnosis: Acute on chronic hypoxemic respiratory failure secondary to acute COPD exacerbation with associated acute encephalopathy.  Discharge Instructions  Discharge Instructions     Diet - low sodium heart healthy   Complete by: As directed    Increase activity slowly   Complete by: As directed    No wound care   Complete by: As directed       Allergies as of 12/12/2022       Reactions   Iron Dextran Shortness Of Breath, Anxiety   Aspirin Nausea And Vomiting, Other (See Comments)   Ok to take tylenol or ibuprofen   Penicillins Other (See Comments)   Unknown. Childhood reaction         Medication List     STOP taking these medications    doxycycline 100 MG capsule Commonly known as: VIBRAMYCIN   Spiriva HandiHaler 18 MCG inhalation capsule Generic drug: tiotropium   Ventolin HFA 108 (90 Base) MCG/ACT inhaler Generic drug: albuterol       TAKE these medications    Combivent Respimat 20-100 MCG/ACT Aers respimat Generic drug: Ipratropium-Albuterol Inhale 1 puff into the lungs every 6 (six) hours as needed for wheezing or shortness of breath.   guaiFENesin 600 MG 12 hr tablet Commonly known as: MUCINEX Take 1 tablet (600 mg total) by mouth 2 (two) times daily for 10 days.   metFORMIN 500 MG tablet Commonly known as: GLUCOPHAGE Take 1 tablet (500 mg total) by mouth 2 (two) times daily with a meal.   metoprolol tartrate 25 MG tablet Commonly known as: LOPRESSOR Take 1 tablet (25 mg total) by mouth 2 (two) times daily.   OLANZapine 10 MG tablet Commonly known as: ZYPREXA  Take 1 tablet (10 mg total) by mouth at bedtime.   predniSONE 10 MG tablet Commonly known as: DELTASONE Take 4 tablets (40 mg total) by mouth daily for 5 days.   Symbicort 160-4.5 MCG/ACT inhaler Generic drug: budesonide-formoterol Inhale 2 puffs into the lungs 2 (two) times daily.   thiamine 100 MG tablet Commonly known as: VITAMIN B1 Take 1 tablet  (100 mg total) by mouth daily.        Follow-up Information     Fleet Contras, MD. Schedule an appointment as soon as possible for a visit in 1 week(s).   Specialty: Internal Medicine Contact information: [address] [place] [address] [phone]                Allergies  Allergen Reactions   Iron Dextran Shortness Of Breath and Anxiety   Aspirin Nausea And Vomiting and Other (See Comments)    Ok to take tylenol or ibuprofen    Penicillins Other (See Comments)    Unknown. Childhood reaction     Consultations: None   Procedures/Studies: DG Chest 2 View  Result Date: 12/10/2022 CLINICAL DATA:  Shortness of breath EXAM: CHEST - 2 VIEW COMPARISON:  Chest x-ray 12/05/2022.  CT of the chest 12/05/2022. FINDINGS: Small focal nodular opacity in the left upper lobe is unchanged. Band of opacity in the left lower lung is unchanged. Right lung is clear. No pleural effusion or pneumothorax. Cardiomediastinal silhouette is stable, the heart is mildly enlarged. Osseous structures are unchanged. There is compression deformity of the midthoracic vertebral body. IMPRESSION: 1. Unchanged left upper lobe nodular density and linear band of opacity in the left lower lung. Findings are indeterminate and may be infectious/inflammatory. Follow-up recommended to confirm complete resolution. Electronically Signed   By: Darliss Cheney M.D.   On: 12/10/2022 22:48   CT Angio Chest PE W and/or Wo Contrast  Result Date: 12/05/2022 CLINICAL DATA:  57 year old female with history of right lower extremity edema. Shortness of breath. EXAM: CT ANGIOGRAPHY CHEST WITH CONTRAST TECHNIQUE: Multidetector CT imaging of the chest was performed using the standard protocol during bolus administration of intravenous contrast. Multiplanar CT image reconstructions and MIPs were obtained to evaluate the vascular anatomy. RADIATION DOSE REDUCTION: This exam was performed according to the departmental  dose-optimization program which includes automated exposure control, adjustment of the mA and/or kV according to patient size and/or use of iterative reconstruction technique. CONTRAST:  50mL OMNIPAQUE IOHEXOL 350 MG/ML SOLN COMPARISON:  Chest CTA 09/20/2022. FINDINGS: Cardiovascular: No filling defects are noted within the pulmonary arterial tree to suggest pulmonary embolism. Heart size is normal. There is no significant pericardial fluid, thickening or pericardial calcification. There is aortic atherosclerosis, as well as atherosclerosis of the great vessels of the mediastinum and the coronary arteries, including calcified atherosclerotic plaque in the left main, left circumflex and right coronary arteries. Dilatation of the pulmonic trunk (3.6 cm in diameter). Mediastinum/Nodes: No pathologically enlarged mediastinal or hilar lymph nodes. Thickening of the distal esophagus just before the gastroesophageal junction. No axillary lymphadenopathy. Lungs/Pleura: Several new nodular densities are noted in the lungs bilaterally, not evident on the recent prior study from 11/20/2022, presumably of infectious or inflammatory etiology. The largest of these is in the superior segment of the left lower lobe (axial image 36 of series 6) measuring 11 x 9 mm. There also some areas of apparent airspace consolidation, 1 of which is rather unusual and elongated in appearance in the anterior aspect of the left lower lobe (axial  image 69 of series 6), also likely of infectious or inflammatory etiology, new compared to the prior study. Some bibasilar subsegmental atelectasis is also noted. No pleural effusions. There is a background of mild paraseptal emphysema. Upper Abdomen: Postoperative changes in the upper abdomen partially imaged, likely from gastric bypass. Musculoskeletal: Chronic appearing compression fractures of superior endplate of T1, superior endplate of T6 and anterior aspect of T9, most severe at T9 where there is  30% loss of anterior vertebral body height. Mild sclerosis in the manubrium also suggestive of an old healed fracture. There are no aggressive appearing lytic or blastic lesions noted in the visualized portions of the skeleton. Review of the MIP images confirms the above findings. IMPRESSION: 1. No evidence of pulmonary embolism. 2. Unusual new areas of airspace consolidation, many of which are nodular in appearance, as above, presumably indicative of an acute infectious or inflammatory process. Given the nodular appearance of several of these regions, the possibility of septic emboli should be considered. 3. Dilatation of the pulmonic trunk (3.6 cm in diameter), concerning for pulmonary arterial hypertension. 4. Aortic atherosclerosis, in addition to left main and 2 vessel coronary artery disease. Please note that although the presence of coronary artery calcium documents the presence of coronary artery disease, the severity of this disease and any potential stenosis cannot be assessed on this non-gated CT examination. Assessment for potential risk factor modification, dietary therapy or pharmacologic therapy may be warranted, if clinically indicated. 5. Mild paraseptal emphysema. 6. Additional incidental findings, as above. Aortic Atherosclerosis (ICD10-I70.0) and Emphysema (ICD10-J43.9). Electronically Signed   By: Trudie Reed M.D.   On: 12/05/2022 07:36   DG Chest Portable 1 View  Result Date: 12/05/2022 CLINICAL DATA:  57 year old female with chest pain, back pain and increased work of breathing for 3 days. EXAM: PORTABLE CHEST 1 VIEW COMPARISON:  Portable chest 11/27/2022 and earlier. FINDINGS: Portable AP semi upright view at 0451 hours. Improved lung volumes and bilateral ventilation from earlier this month, with virtually resolved Patchy and confluent lower lung opacity now. Mediastinal contours fast that Normal cardiac size and mediastinal contours. Visualized tracheal air column is within normal  limits. No pneumothorax or pleural effusion. Allowing for portable technique the lungs are clear. Negative visible bowel gas. No acute osseous abnormality identified. IMPRESSION: No acute cardiopulmonary abnormality. Resolved bibasilar lung opacity since 11/27/2022. Electronically Signed   By: Odessa Fleming M.D.   On: 12/05/2022 05:00   DG Chest Port 1V same Day  Result Date: 11/27/2022 CLINICAL DATA:  Shortness of breath EXAM: PORTABLE CHEST 1 VIEW COMPARISON:  11/25/2022 FINDINGS: Reversal lordotic projection. Increased airspace opacity of both lung bases suspicious for pneumonia, aspiration pneumonitis, or atelectasis. Heart size within normal limits. Faint hazy left midlung perihilar opacity, conceivably residuum from edema. Lower thoracic spondylosis. IMPRESSION: 1. Increased airspace opacity at both lung bases suspicious for pneumonia, aspiration pneumonitis, or atelectasis. 2. Faint hazy left midlung perihilar opacity, conceivably residuum from edema. Electronically Signed   By: Gaylyn Rong M.D.   On: 11/27/2022 13:23   DG Chest Port 1 View  Result Date: 11/25/2022 CLINICAL DATA:  Dyspnea EXAM: PORTABLE CHEST 1 VIEW COMPARISON:  Chest x-ray 11/24/2022 FINDINGS: There central peribronchial cuffing and central interstitial prominence. There is minimal bibasilar atelectasis. There is no pleural effusion or pneumothorax. No acute fractures are seen. IMPRESSION: Central peribronchial cuffing and central interstitial prominence, which can be seen with bronchitis or reactive airways disease. Electronically Signed   By: Mcneil Sober.D.  On: 11/25/2022 16:43   DG Swallowing Func-Speech Pathology  Result Date: 11/25/2022 Table formatting from the original result was not included. Modified Barium Swallow Study Patient Details Name: BETTYANN BIRCHLER MRN: [id number] Date of Birth: [birth year] Today's Date: 11/25/2022 HPI/PMH: HPI: ZAMYIA GOWELL is a 57 y.o. female admitted 5/30 with SOB, AMS, COPD and ETOH  withdrawal.  Drinks daily and cocaine postive on admit.  Postive Flu.  PMH significant for DM2, HTN, HLD, A-fib, COPD, schizophrenia, bipolar, anxiety, depression, chronic back pain, neuropathy, hypothyroidism polysubstance abuse (alcohol, tobacco, cocaine) and noncompliant to medications, oxygen or follow-ups. Noted to be coughing after meds. MBS with aspiration of thin 02/16/22 with nectar recommended and Dys 3 Clinical Impression: Clinical Impression: Pt exhibited mild oropharyngeal dysphagia impacted by mild lethargy, delayed processing and decreased respiratory status with RR fluctuating (21-44). She demonstrated decreased oral cohesion, control with barium falling under tongue and with premature spill over tongue base x 1. Delayed swallow initiation with honey and puree consistencies reaching valleculae for 5-7 seconds before swallow initiated. Thin barium was penetrated from residue to the vocal cords after the swallow and once with nectar. Pt produced spontaneous throat clear once and needed verbal cues to clear throat which removed penetrates momentarily only to return to vocal cord level with thin. Reduced tongue base retraction led to minimal residue on base of tongue, in valleculae and pyriform sinuses. She had delays performing second swallows but once able to initiate cleared majority of residue. There was minimally decreased PES distention with trace residue intermittently below level of PES. Esophageal sweep was unremarkable. Recommend Dys 1 (puree) texture, nectar thick liquids, swallow 2 times, rest breaks if lethargic or increased respiratory rate and full assist with meals for strategies. Factors that may increase risk of adverse event in presence of aspiration [name] & [name] 2021): Factors that may increase risk of adverse event in presence of aspiration [name] & [name] 2021): Frail or deconditioned; Reduced cognitive function; Poor general health and/or compromised immunity  Recommendations/Plan: Swallowing Evaluation Recommendations Swallowing Evaluation Recommendations Recommendations: PO diet PO Diet Recommendation: Dysphagia 1 (Pureed); Mildly thick liquids (Level 2, nectar thick) Liquid Administration via: Cup; Straw Medication Administration: Crushed with puree Supervision: Staff to assist with self-feeding; Full supervision/cueing for swallowing strategies Swallowing strategies  : Slow rate; Small bites/sips; Multiple dry swallows after each bite/sip Postural changes: Position pt fully upright for meals Oral care recommendations: Oral care BID (2x/day) Treatment Plan Treatment Plan Treatment recommendations: Therapy as outlined in treatment plan below Follow-up recommendations: Skilled nursing-short term rehab (<3 hours/day) Functional status assessment: Patient has had a recent decline in their functional status and demonstrates the ability to make significant improvements in function in a reasonable and predictable amount of time. Treatment frequency: Min 2x/week Treatment duration: 2 weeks Interventions: Aspiration precaution training; Patient/family education; Trials of upgraded texture/liquids; Compensatory techniques; Diet toleration management by SLP Recommendations Recommendations for follow up therapy are one component of a multi-disciplinary discharge planning process, led by the attending physician.  Recommendations may be updated based on patient status, additional functional criteria and insurance authorization. Assessment: Orofacial Exam: Orofacial Exam Oral Cavity: Oral Hygiene: WFL Oral Cavity - Dentition: Poor condition; Missing dentition Orofacial Anatomy: WFL Oral Motor/Sensory Function: WFL Anatomy: Anatomy: WFL Boluses Administered: Boluses Administered Boluses Administered: Thin liquids (Level 0); Mildly thick liquids (Level 2, nectar thick); Moderately thick liquids (Level 3, honey thick); Puree; Solid  Oral Impairment Domain: Oral Impairment Domain Lip  Closure: No labial escape Tongue control during bolus hold:  Escape to lateral buccal cavity/floor of mouth; Posterior escape of less than half of bolus Bolus preparation/mastication: Slow prolonged chewing/mashing with complete recollection Bolus transport/lingual motion: Delayed initiation of tongue motion (oral holding) Oral residue: Trace residue lining oral structures Location of oral residue : Tongue; Floor of mouth Initiation of pharyngeal swallow : Pyriform sinuses  Pharyngeal Impairment Domain: Pharyngeal Impairment Domain Soft palate elevation: No bolus between soft palate (SP)/pharyngeal wall (PW) Laryngeal elevation: Complete superior movement of thyroid cartilage with complete approximation of arytenoids to epiglottic petiole Anterior hyoid excursion: Complete anterior movement Epiglottic movement: Complete inversion Laryngeal vestibule closure: Complete, no air/contrast in laryngeal vestibule Pharyngeal stripping wave : Present - complete Pharyngeal contraction (A/P view only): N/A Pharyngoesophageal segment opening: Partial distention/partial duration, partial obstruction of flow Tongue base retraction: Trace column of contrast or air between tongue base and PPW Pharyngeal residue: Trace residue within or on pharyngeal structures Location of pharyngeal residue: Valleculae; Pyriform sinuses  Esophageal Impairment Domain: Esophageal Impairment Domain Esophageal clearance upright position: Complete clearance, esophageal coating Pill: Esophageal Impairment Domain Esophageal clearance upright position: Complete clearance, esophageal coating Penetration/Aspiration Scale Score: Penetration/Aspiration Scale Score 3.  Material enters airway, remains ABOVE vocal cords and not ejected out: Mildly thick liquids (Level 2, nectar thick) 5.  Material enters airway, CONTACTS cords and not ejected out: Thin liquids (Level 0) Compensatory Strategies: Compensatory Strategies Compensatory strategies: Yes Other(comment):  Effective (throat clear) Effective Other(comment): Thin liquid (Level 0); Mildly thick liquid (Level 2, nectar thick)   General Information: Caregiver present: No  Diet Prior to this Study: Regular; Thin liquids (Level 0)   Temperature : Normal   Respiratory Status: WFL   Supplemental O2: Nasal cannula   History of Recent Intubation: No  Behavior/Cognition: Lethargic/Drowsy; Cooperative; Pleasant mood; Requires cueing Self-Feeding Abilities: Needs assist with self-feeding Baseline vocal quality/speech: Hypophonia/low volume No data recorded Volitional Swallow: Able to elicit (with delays) No data recorded Goal Planning: Prognosis for improved oropharyngeal function: Good Barriers to Reach Goals: Cognitive deficits No data recorded No data recorded Consulted and agree with results and recommendations: Patient; Nurse Pain: Pain Assessment Pain Assessment: Faces Pain Score: 8 Faces Pain Scale: 0 Breathing: 0 Negative Vocalization: 0 Facial Expression: 0 Body Language: 0 Consolability: 0 PAINAD Score: 0 Facial Expression: 0 Body Movements: 0 Muscle Tension: 0 Compliance with ventilator (intubated pts.): N/A Vocalization (extubated pts.): 0 CPOT Total: 0 Pain Location: back Pain Descriptors / Indicators: Aching; Constant Pain Intervention(s): Limited activity within patient's tolerance; Repositioned; Monitored during session End of Session: Start Time:SLP Start Time (ACUTE ONLY): 1239 Stop Time: SLP Stop Time (ACUTE ONLY): 1254 Time Calculation:SLP Time Calculation (min) (ACUTE ONLY): 15 min Charges: SLP Evaluations $ SLP Speech Visit: 1 Visit SLP Evaluations $BSS Swallow: 1 Procedure $MBS Swallow: 1 Procedure SLP visit diagnosis: SLP Visit Diagnosis: Dysphagia, oropharyngeal phase (R13.12) Past Medical History: Past Medical History: Diagnosis Date  Abdominal pain   Accidental drug overdose April 2013  Anxiety   Atrial fibrillation (HCC) 09/29/11  converted spontaneously  Chronic back pain   Chronic knee pain   Chronic  nausea   Chronic pain   COPD (chronic obstructive pulmonary disease) (HCC)   Depression   Diabetes mellitus   states her doctor took her off all DM meds in past month  Diabetic neuropathy (HCC)   Dyspnea   with exertion   GERD (gastroesophageal reflux disease)   Headache(784.0)   migraines   HTN (hypertension)   not on meds since in a year   Hyperlipidemia  Hypothyroidism   not on meds in a while   Mental disorder   Bipolar and schizophrenic  Requires supplemental oxygen   as needed per patient   Schizophrenia (HCC)   Schizophrenia, acute (HCC) 11/13/2017  Tobacco abuse  Past Surgical History: Past Surgical History: Procedure Laterality Date  ABDOMINAL HYSTERECTOMY    BLADDER SUSPENSION  03/04/2011  Procedure: [facility] PROCEDURE;  Surgeon: Jonna Coup MacDiarmid;  Location: [facility];  Service: Urology;  Laterality: N/A;  BOWEL RESECTION N/A 04/18/2022  Procedure: SMALL BOWEL RESECTION;  Surgeon: Harriette Bouillon, MD;  Location: [facility] OR;  Service: General;  Laterality: N/A;  CYSTOCELE REPAIR  03/04/2011  Procedure: ANTERIOR REPAIR (CYSTOCELE);  Surgeon: Martina Sinner;  Location: [facility];  Service: Urology;  Laterality: N/A;  CYSTOSCOPY  03/04/2011  Procedure: CYSTOSCOPY;  Surgeon: Jonna Coup MacDiarmid;  Location: [facility];  Service: Urology;  Laterality: N/A;  ESOPHAGOGASTRODUODENOSCOPY (EGD) WITH PROPOFOL N/A 05/12/2017  Procedure: ESOPHAGOGASTRODUODENOSCOPY (EGD) WITH PROPOFOL;  Surgeon: Ovidio Kin, MD;  Location: [facility];  Service: General;  Laterality: N/A;  GASTRIC ROUX-EN-Y N/A 03/25/2016  Procedure: LAPAROSCOPIC ROUX-EN-Y GASTRIC BYPASS WITH UPPER ENDOSCOPY;  Surgeon: Glenna Fellows, MD;  Location: [facility];  Service: General;  Laterality: N/A;  KNEE SURGERY    LAPAROSCOPIC ASSISTED VAGINAL HYSTERECTOMY  03/04/2011  Procedure: LAPAROSCOPIC ASSISTED VAGINAL HYSTERECTOMY;  Surgeon: Jeani Hawking, MD;  Location: [facility];  Service: Gynecology;  Laterality: N/A;  LAPAROTOMY N/A 04/18/2022  Procedure: EXPLORATORY  LAPAROTOMY;  Surgeon: Harriette Bouillon, MD;  Location: [facility];  Service: General;  Laterality: N/A;  LAPAROTOMY N/A 04/24/2022  Procedure: BRING BACK EXPLORATORY LAPAROTOMY;  Surgeon: Violeta Gelinas, MD;  Location[facility] OR;  Service: General;  Laterality: N/A;  TOTAL HIP ARTHROPLASTY Right 08/27/2022  Procedure: TOTAL HIP ARTHROPLASTY;  Surgeon: Joen Laura, MD;  Location: [facility] OR;  Service: Orthopedics;  Laterality: Right; [name] 11/25/2022, 2:39 PM  DG Chest Port 1 View  Result Date: 11/24/2022 CLINICAL DATA:  Dyspnea EXAM: PORTABLE CHEST 1 VIEW COMPARISON:  X-ray 11/20/2022 and older FINDINGS: Underinflation. Borderline cardiopericardial silhouette. Slight vascular congestion. Decreasing interstitial changes. Question tiny left effusion. Persistent left retrocardiac opacity. No pneumothorax. Overlapping cardiac leads. IMPRESSION: Improving interstitial changes with some residual. Tiny left effusion. Persistent left lung base opacity Electronically Signed   By: Karen Kays M.D.   On: 11/24/2022 16:22   DG Chest Port 1 View  Result Date: 11/20/2022 CLINICAL DATA:  Aspiration pneumonia. EXAM: PORTABLE CHEST 1 VIEW COMPARISON:  Nov 20, 2022. FINDINGS: Subtle opacities in the left lower lobe, which were better characterized on same day CT chest. No visible pleural effusions or pneumothorax. Cardiomediastinal silhouette is within normal limits. No acute osseous abnormality. IMPRESSION: Subtle opacities in the left lower lobe, which were better characterized on same day CT chest. Electronically Signed   By: Feliberto Harts M.D.   On: 11/20/2022 16:58   CT Angio Chest PE W and/or Wo Contrast  Result Date: 11/20/2022 CLINICAL DATA:  Two-week history of shortness of breath EXAM: CT ANGIOGRAPHY CHEST WITH CONTRAST TECHNIQUE: Multidetector CT imaging of the chest was performed using the standard protocol during bolus administration of intravenous contrast. Multiplanar CT image reconstructions and  MIPs were obtained to evaluate the vascular anatomy. RADIATION DOSE REDUCTION: This exam was performed according to the departmental dose-optimization program which includes automated exposure control, adjustment of the mA and/or kV according to patient size and/or use of iterative reconstruction technique. CONTRAST:  75mL OMNIPAQUE IOHEXOL 350 MG/ML SOLN COMPARISON:  CT  chest dated 08/25/2022 FINDINGS: Cardiovascular: The study is high quality for the evaluation of pulmonary embolism. There are no filling defects in the central, lobar, segmental or subsegmental pulmonary artery branches to suggest acute pulmonary embolism. Main pulmonary artery measures 3.5 cm, unchanged. Normal heart size. No significant pericardial fluid/thickening. Coronary artery calcifications and aortic atherosclerosis. Mediastinum/Nodes: Imaged thyroid gland without nodules meeting criteria for imaging follow-up by size. Small hiatal hernia. No pathologically enlarged axillary, supraclavicular, mediastinal, or hilar lymph nodes. Lungs/Pleura: The central airways are patent. Diffuse peribronchial wall thickening with subsegmental mucous plugging in the left lower lobe. Scattered peribronchovascular ground-glass densities throughout. Upper lobe predominant paraseptal and centrilobular emphysema. No pneumothorax. No pleural effusion. Upper abdomen: Postsurgical changes of the stomach. Musculoskeletal: Increased sclerotic appearance of the manubrium. Unchanged compression of T9. Review of the MIP images confirms the above findings. IMPRESSION: 1. No evidence of pulmonary embolism. 2. Diffuse peribronchial wall thickening with subsegmental mucous plugging in the left lower lobe in keeping with bronchitis. Scattered peribronchovascular ground-glass densities throughout, likely infectious or inflammatory. 3. Unchanged dilation of the main pulmonary artery, which can be seen in the setting of pulmonary arterial hypertension. 4. Increased sclerosis  of the manubrium, likely reflecting healing fracture. 5. Aortic Atherosclerosis (ICD10-I70.0) and Emphysema (ICD10-J43.9). Coronary artery calcifications. Assessment for potential risk factor modification, dietary therapy or pharmacologic therapy may be warranted, if clinically indicated. Electronically Signed   By: Agustin Cree M.D.   On: 11/20/2022 13:58   DG Chest Portable 1 View  Result Date: 11/20/2022 CLINICAL DATA:  Shortness of breath EXAM: PORTABLE CHEST 1 VIEW COMPARISON:  X-ray 08/25/2022 FINDINGS: No consolidation, pneumothorax or effusion. No edema. Normal cardiopericardial silhouette. Overlapping cardiac leads. IMPRESSION: No acute cardiopulmonary disease. Electronically Signed   By: Karen Kays M.D.   On: 11/20/2022 10:44     Discharge Exam: Vitals:   12/12/22 0833 12/12/22 0850  BP:    Pulse: 78   Resp: 18   Temp:    SpO2: 99% 98%   Vitals:   12/12/22 0536 12/12/22 0745 12/12/22 0833 12/12/22 0850  BP: 110/72     Pulse: 82 73 78   Resp:  18 18   Temp:  97.7 F (36.5 C)    TempSrc:  Oral    SpO2:  98% 99% 98%    General: Pt is alert, awake, not in acute distress Cardiovascular: RRR, S1/S2 +, no rubs, no gallops Respiratory: CTA bilaterally, no wheezing, no rhonchi, on nasal cannula Abdominal: Soft, NT, ND, bowel sounds + Extremities: no edema, no cyanosis    The results of significant diagnostics from this hospitalization (including imaging, microbiology, ancillary and laboratory) are listed below for reference.     Microbiology: Recent Results (from the past 240 hour(s))  Blood culture (routine x 2)     Status: None (Preliminary result)   Collection Time: 12/11/22  1:18 AM   Specimen: BLOOD RIGHT WRIST  Result Value Ref Range Status   Specimen Description BLOOD RIGHT WRIST  Final   Special Requests   Final    BOTTLES DRAWN AEROBIC AND ANAEROBIC Blood Culture results may not be optimal due to an inadequate volume of blood received in culture bottles    Culture   Final    NO GROWTH 1 DAY Performed at [facility], [address], [place], [place] [address]    Report Status PENDING  Incomplete  Blood culture (routine x 2)     Status: None (Preliminary result)   Collection Time: 12/11/22  1:18 AM   Specimen: BLOOD LEFT FOREARM  Result Value Ref Range Status   Specimen Description BLOOD LEFT FOREARM  Final   Special Requests   Final    BOTTLES DRAWN AEROBIC ONLY Blood Culture results may not be optimal due to an inadequate volume of blood received in culture bottles   Culture   Final    NO GROWTH 1 DAY Performed at [facility], [address], [place], [place] [address]    Report Status PENDING  Incomplete     Labs: BNP (last 3 results) Recent Labs    02/12/22 1048 11/20/22 1039 11/28/22 0645  BNP 70.9 84.2 63.9   Basic Metabolic Panel: Recent Labs  Lab 12/10/22 2222 12/11/22 0405  NA 135 134*  K 4.5 4.4  CL 103 100  CO2 21* 25  GLUCOSE 235* 255*  BUN 8 5*  CREATININE 0.53 0.56  CALCIUM 8.3* 8.2*   Liver Function Tests: Recent Labs  Lab 12/11/22 0034  AST 65*  ALT 47*  ALKPHOS 114  BILITOT 0.8  PROT 7.1  ALBUMIN 3.3*   No results for input(s): "LIPASE", "AMYLASE" in the last 168 hours. Recent Labs  Lab 12/11/22 1008  AMMONIA 61*   CBC: Recent Labs  Lab 12/10/22 2222 12/11/22 0405 12/12/22 0542  WBC 7.8 7.7 8.0  NEUTROABS 6.9  --   --   HGB 9.2* 9.2* 10.2*  HCT 30.1* 30.9* 32.5*  MCV 73.2* 73.2* 75.1*  PLT 569* 555* 424*   Cardiac Enzymes: No results for input(s): "CKTOTAL", "CKMB", "CKMBINDEX", "TROPONINI" in the last 168 hours. BNP: Invalid input(s): "POCBNP" CBG: Recent Labs  Lab 12/11/22 0817 12/11/22 1244 12/11/22 1554 12/11/22 1950 12/12/22 0746  GLUCAP 130* 157* 106* 248* 147*   D-Dimer No results for input(s): "DDIMER" in the last 72 hours. Hgb A1c No results for input(s): "HGBA1C" in the last 72 hours. Lipid Profile No results for input(s): "CHOL", "HDL",  "LDLCALC", "TRIG", "CHOLHDL", "LDLDIRECT" in the last 72 hours. Thyroid function studies No results for input(s): "TSH", "T4TOTAL", "T3FREE", "THYROIDAB" in the last 72 hours.  Invalid input(s): "FREET3" Anemia work up No results for input(s): "VITAMINB12", "FOLATE", "FERRITIN", "TIBC", "IRON", "RETICCTPCT" in the last 72 hours. Urinalysis    Component Value Date/Time   COLORURINE YELLOW 08/17/2022 0921   APPEARANCEUR CLEAR 08/17/2022 0921   LABSPEC 1.009 08/17/2022 0921   PHURINE 6.0 08/17/2022 0921   GLUCOSEU 50 (A) 08/17/2022 0921   HGBUR NEGATIVE 08/17/2022 0921   BILIRUBINUR NEGATIVE 08/17/2022 0921   KETONESUR NEGATIVE 08/17/2022 0921   PROTEINUR NEGATIVE 08/17/2022 0921   UROBILINOGEN 0.2 09/04/2013 0426   NITRITE NEGATIVE 08/17/2022 0921   LEUKOCYTESUR NEGATIVE 08/17/2022 0921   Sepsis Labs Recent Labs  Lab 12/10/22 2222 12/11/22 0405 12/12/22 0542  WBC 7.8 7.7 8.0   Microbiology Recent Results (from the past 240 hour(s))  Blood culture (routine x 2)     Status: None (Preliminary result)   Collection Time: 12/11/22  1:18 AM   Specimen: BLOOD RIGHT WRIST  Result Value Ref Range Status   Specimen Description BLOOD RIGHT WRIST  Final   Special Requests   Final    BOTTLES DRAWN AEROBIC AND ANAEROBIC Blood Culture results may not be optimal due to an inadequate volume of blood received in culture bottles   Culture   Final    NO GROWTH 1 DAY Performed at [facility], [address] [place], [place] [address]    Report Status PENDING  Incomplete  Blood culture (routine x 2)     Status: None (Preliminary result)   Collection Time: 12/11/22  1:18 AM   Specimen: BLOOD LEFT FOREARM  Result Value Ref Range Status   Specimen Description BLOOD LEFT FOREARM  Final   Special Requests   Final    BOTTLES DRAWN AEROBIC ONLY Blood Culture results may not be optimal due to an inadequate volume of blood received in culture bottles   Culture   Final    NO GROWTH 1  DAY Performed at [facility], [address], [place], [place] [address]    Report Status PENDING  Incomplete     Time coordinating discharge: 35 minutes  SIGNED:   Erick Blinks, DO [facility] 12/12/2022, 10:00 AM  If 7PM-7AM, please contact night-coverage [email or web address]

## 2022-12-12 NOTE — Progress Notes (Signed)
Pt was asleep after geodon dose.

## 2022-12-12 NOTE — TOC Progression Note (Addendum)
Transition of Care (TOC) - Progression Note   Spoke to [name], patient's daughter. Patient is being discharged today. Patient's brother will provide transportation home. He will bring the heavy cane she was provided last admission and [employer] will exchange for a lighter cane  Canes were exchanged prior to discharge   Patient Details  Name: [name] MRN: [id number] Date of Birth: February 12, 1966  Transition of [facility]) CM/SW Contact  Obie Kallenbach, Adria Devon, RN Phone Number: 12/12/2022, 10:07 AM  Clinical Narrative:       Expected Discharge Plan: Home/Self Care    Expected Discharge Plan and Services         Expected Discharge Date: 12/12/22               DME Arranged:  (see note)                     Social Determinants of Health (SDOH) Interventions SDOH Screenings   Food Insecurity: No Food Insecurity (05/06/2022)  Housing: Low Risk  (05/06/2022)  Transportation Needs: No Transportation Needs (05/06/2022)  Utilities: Not At Risk (05/06/2022)  Alcohol Screen: Medium Risk (08/04/2018)  Social Connections: Moderately Isolated (05/06/2022)  Tobacco Use: High Risk (12/10/2022)    Readmission Risk Interventions    12/12/2022    7:47 AM 11/21/2022    1:16 PM 04/28/2022    2:18 PM  Readmission Risk Prevention Plan  Transportation Screening Complete Complete Complete  Medication Review (RN Care Manager) Referral to Pharmacy Referral to Pharmacy Complete  PCP or Specialist appointment within 3-5 days of discharge Complete  Complete  HRI or Home Care Consult Complete Complete Complete  SW Recovery Care/Counseling Consult Complete  Complete  Palliative Care Screening Not Applicable Not Applicable Not Applicable  Skilled Nursing Facility Not Applicable Not Applicable Not Applicable

## 2022-12-12 NOTE — Progress Notes (Signed)
TOC discharge meds provided to pt.

## 2022-12-12 NOTE — Progress Notes (Addendum)
PT AAOX2- Patient refusing to keep nasal cannula on and heart monitor. Patient educated on risks of keeping oxygen and heart monitor off. She states "I don't care." On call provider [place] notified.

## 2022-12-13 ENCOUNTER — Emergency Department (HOSPITAL_COMMUNITY): Payer: 59

## 2022-12-13 ENCOUNTER — Encounter (HOSPITAL_COMMUNITY): Payer: Self-pay

## 2022-12-13 ENCOUNTER — Emergency Department (HOSPITAL_COMMUNITY)
Admission: EM | Admit: 2022-12-13 | Discharge: 2022-12-13 | Disposition: A | Discharge status: Home / Self Care | Payer: 59 | Attending: Emergency Medicine | Admitting: Emergency Medicine

## 2022-12-13 ENCOUNTER — Other Ambulatory Visit: Payer: Self-pay

## 2022-12-13 DIAGNOSIS — R0602 Shortness of breath: Secondary | ICD-10-CM | POA: Insufficient documentation

## 2022-12-13 DIAGNOSIS — R0789 Other chest pain: Secondary | ICD-10-CM | POA: Diagnosis not present

## 2022-12-13 DIAGNOSIS — R079 Chest pain, unspecified: Secondary | ICD-10-CM | POA: Diagnosis not present

## 2022-12-13 DIAGNOSIS — Z743 Need for continuous supervision: Secondary | ICD-10-CM | POA: Diagnosis not present

## 2022-12-13 DIAGNOSIS — M549 Dorsalgia, unspecified: Secondary | ICD-10-CM | POA: Diagnosis not present

## 2022-12-13 DIAGNOSIS — J449 Chronic obstructive pulmonary disease, unspecified: Secondary | ICD-10-CM | POA: Diagnosis not present

## 2022-12-13 DIAGNOSIS — E1140 Type 2 diabetes mellitus with diabetic neuropathy, unspecified: Secondary | ICD-10-CM | POA: Diagnosis not present

## 2022-12-13 DIAGNOSIS — F17210 Nicotine dependence, cigarettes, uncomplicated: Secondary | ICD-10-CM | POA: Insufficient documentation

## 2022-12-13 DIAGNOSIS — E039 Hypothyroidism, unspecified: Secondary | ICD-10-CM | POA: Insufficient documentation

## 2022-12-13 DIAGNOSIS — Z96641 Presence of right artificial hip joint: Secondary | ICD-10-CM | POA: Diagnosis not present

## 2022-12-13 DIAGNOSIS — I499 Cardiac arrhythmia, unspecified: Secondary | ICD-10-CM | POA: Diagnosis not present

## 2022-12-13 DIAGNOSIS — I10 Essential (primary) hypertension: Secondary | ICD-10-CM | POA: Diagnosis not present

## 2022-12-13 DIAGNOSIS — R6889 Other general symptoms and signs: Secondary | ICD-10-CM | POA: Diagnosis not present

## 2022-12-13 LAB — CBC
HCT: 32.3 % — ABNORMAL LOW (ref 36.0–46.0)
Hemoglobin: 9.7 g/dL — ABNORMAL LOW (ref 12.0–15.0)
MCH: 23.3 pg — ABNORMAL LOW (ref 26.0–34.0)
MCHC: 30 g/dL (ref 30.0–36.0)
MCV: 77.5 fL — ABNORMAL LOW (ref 80.0–100.0)
Platelets: 450 10*3/uL — ABNORMAL HIGH (ref 150–400)
RBC: 4.17 MIL/uL (ref 3.87–5.11)
RDW: 20.1 % — ABNORMAL HIGH (ref 11.5–15.5)
WBC: 8.1 10*3/uL (ref 4.0–10.5)
nRBC: 0 % (ref 0.0–0.2)

## 2022-12-13 LAB — BASIC METABOLIC PANEL
Anion gap: 10 (ref 5–15)
BUN: 13 mg/dL (ref 6–20)
CO2: 24 mmol/L (ref 22–32)
Calcium: 8.2 mg/dL — ABNORMAL LOW (ref 8.9–10.3)
Chloride: 106 mmol/L (ref 98–111)
Creatinine, Ser: 0.53 mg/dL (ref 0.44–1.00)
GFR, Estimated: >60 mL/min (ref >60)
Glucose, Bld: 174 mg/dL — ABNORMAL HIGH (ref 70–99)
Potassium: 4.2 mmol/L (ref 3.5–5.1)
Sodium: 140 mmol/L (ref 135–145)

## 2022-12-13 LAB — TROPONIN I (HIGH SENSITIVITY)
Troponin I (High Sensitivity): 4 ng/L (ref <18)
Troponin I (High Sensitivity): 6 ng/L (ref <18)

## 2022-12-13 LAB — CULTURE, BLOOD (ROUTINE X 2): Culture: NO GROWTH

## 2022-12-13 MED ORDER — METHOCARBAMOL 500 MG PO TABS
500.0000 mg | ORAL_TABLET | Freq: Once | ORAL | Status: AC
  Administered 2022-12-13: 500 mg via ORAL
  Filled 2022-12-13: qty 1

## 2022-12-13 MED ORDER — KETOROLAC TROMETHAMINE 15 MG/ML IJ SOLN
15.0000 mg | Freq: Once | INTRAMUSCULAR | Status: AC
  Administered 2022-12-13: 15 mg via INTRAVENOUS
  Filled 2022-12-13: qty 1

## 2022-12-13 NOTE — ED Provider Notes (Signed)
[facility] Emergency Department Provider Note MRN:  [id number]  Arrival date & time: 12/13/22     Chief Complaint   Back Pain   History of Present Illness   [name] is a 57 y.o. year-old female with a history of polysubstance use presenting to the ED with chief complaint of back pain.  Chest and back pain starting 1 hour ago, constant.  Feels short of breath.  Review of Systems  A thorough review of systems was obtained and all systems are negative except as noted in the HPI and PMH.   Patient's Health History    Past Medical History:  Diagnosis Date   Abdominal pain    Accidental drug overdose April 2013   Anxiety    Atrial fibrillation (HCC) 09/29/11   converted spontaneously   Chronic back pain    Chronic knee pain    Chronic nausea    Chronic pain    COPD (chronic obstructive pulmonary disease) (HCC)    Depression    Diabetes mellitus    states her doctor took her off all DM meds in past month   Diabetic neuropathy (HCC)    Dyspnea    with exertion    GERD (gastroesophageal reflux disease)    Headache(784.0)    migraines    HTN (hypertension)    not on meds since in a year    Hyperlipidemia    Hypothyroidism    not on meds in a while    Mental disorder    Bipolar and schizophrenic   Requires supplemental oxygen    as needed per patient    Schizophrenia (HCC)    Schizophrenia, acute (HCC) 11/13/2017   Tobacco abuse     Past Surgical History:  Procedure Laterality Date   ABDOMINAL HYSTERECTOMY     BLADDER SUSPENSION  03/04/2011   Procedure: [employer] PROCEDURE;  Surgeon: Jonna Coup MacDiarmid;  Location: [facility];  Service: Urology;  Laterality: N/A;   BOWEL RESECTION N/A 04/18/2022   Procedure: SMALL BOWEL RESECTION;  Surgeon: Harriette Bouillon, MD;  Location: [facility] OR;  Service: General;  Laterality: N/A;   CYSTOCELE REPAIR  03/04/2011   Procedure: ANTERIOR REPAIR (CYSTOCELE);  Surgeon: Martina Sinner;  Location: [facility];  Service:  Urology;  Laterality: N/A;   CYSTOSCOPY  03/04/2011   Procedure: CYSTOSCOPY;  Surgeon: Jonna Coup MacDiarmid;  Location: [facility];  Service: Urology;  Laterality: N/A;   ESOPHAGOGASTRODUODENOSCOPY (EGD) WITH PROPOFOL N/A 05/12/2017   Procedure: ESOPHAGOGASTRODUODENOSCOPY (EGD) WITH PROPOFOL;  Surgeon: Ovidio Kin, MD;  Location: [facility];  Service: General;  Laterality: N/A;   GASTRIC ROUX-EN-Y N/A 03/25/2016   Procedure: LAPAROSCOPIC ROUX-EN-Y GASTRIC BYPASS WITH UPPER ENDOSCOPY;  Surgeon: Glenna Fellows, MD;  Location: [facility];  Service: General;  Laterality: N/A;   KNEE SURGERY     LAPAROSCOPIC ASSISTED VAGINAL HYSTERECTOMY  03/04/2011   Procedure: LAPAROSCOPIC ASSISTED VAGINAL HYSTERECTOMY;  Surgeon: Jeani Hawking, MD;  Location: [facility];  Service: Gynecology;  Laterality: N/A;   LAPAROTOMY N/A 04/18/2022   Procedure: EXPLORATORY LAPAROTOMY;  Surgeon: Harriette Bouillon, MD;  Location[facility];  Service: General;  Laterality: N/A;   LAPAROTOMY N/A 04/24/2022   Procedure: BRING BACK EXPLORATORY LAPAROTOMY;  Surgeon: Violeta Gelinas, MD;  Location: [facility] OR;  Service: General;  Laterality: N/A;   TOTAL HIP ARTHROPLASTY Right 08/27/2022   Procedure: TOTAL HIP ARTHROPLASTY;  Surgeon: Joen Laura, MD;  Location: [facility];  Service: Orthopedics;  Laterality: Right;    Family History  Problem Relation Age of Onset   Heart attack Father        54s   Diabetes Mother    Heart disease Mother    Hypertension Mother    Heart attack Sister        67   COPD Other    Breast cancer Neg Hx     Social History   Socioeconomic History   Marital status: Legally Separated    Spouse name: Not on file   Number of children: Not on file   Years of education: Not on file   Highest education level: Not on file  Occupational History   Not on file  Tobacco Use   Smoking status: Every Day    Packs/day: 1    Types: Cigarettes   Smokeless tobacco: Never  Vaping Use   Vaping Use: Never used  Substance  and Sexual Activity   Alcohol use: Yes    Comment: 40 ounces daily   Drug use: Yes    Types: Cocaine, Marijuana, "Crack" cocaine    Comment: last use was about a week ago   Sexual activity: Yes    Partners: Male  Other Topics Concern   Not on file  Social History Narrative   Not on file   Social Determinants of Health   Financial Resource Strain: Not on file  Food Insecurity: No Food Insecurity (05/06/2022)   Hunger Vital Sign    Worried About Running Out of Food in the Last Year: Never true    Ran Out of Food in the Last Year: Never true  Transportation Needs: No Transportation Needs (05/06/2022)   PRAPARE - Administrator, Civil Service (Medical): No    Lack of Transportation (Non-Medical): No  Physical Activity: Not on file  Stress: Not on file  Social Connections: Moderately Isolated (05/06/2022)   Social Connection and Isolation Panel [NHANES]    Frequency of Communication with Friends and Family: More than three times a week    Frequency of Social Gatherings with Friends and Family: Twice a week    Attends Religious Services: 1 to 4 times per year    Active Member of [employer] or Organizations: No    Attends Banker Meetings: Never    Marital Status: Separated  Intimate Partner Violence: Not At Risk (05/06/2022)   Humiliation, Afraid, Rape, and Kick questionnaire    Fear of Current or Ex-Partner: No    Emotionally Abused: No    Physically Abused: No    Sexually Abused: No     Physical Exam   Vitals:   12/13/22 0500 12/13/22 0514  BP:  137/84  Pulse:  91  Resp:  20  Temp:  99 F (37.2 C)  SpO2: 100% 100%    CONSTITUTIONAL: Chronically ill-appearing, NAD NEURO/PSYCH:  Alert and oriented x 3, no focal deficits EYES:  eyes equal and reactive ENT/NECK:  no LAD, no JVD CARDIO: Regular rate, well-perfused, normal S1 and S2 PULM:  CTAB no wheezing or rhonchi GI/GU:  non-distended, non-tender MSK/SPINE:  No gross deformities, no  edema SKIN:  no rash, atraumatic   *Additional and/or pertinent findings included in MDM below  Diagnostic and Interventional Summary    EKG Interpretation  Date/Time:  Saturday December 13 2022 05:10:16 EDT Ventricular Rate:  95 PR Interval:  122 QRS Duration: 88 QT Interval:  344 QTC Calculation: 433 R Axis:   34 Text Interpretation: Sinus rhythm LAE, consider biatrial enlargement Confirmed by Kennis Carina [id number]) on 12/13/2022  5:54:22 AM       Labs Reviewed  CBC  BASIC METABOLIC PANEL  TROPONIN I (HIGH SENSITIVITY)    DG Chest Port 1 View    (Results Pending)    Medications - No data to display   Procedures  /  Critical Care Procedures  ED Course and Medical Decision Making  Initial Impression and Ddx Chest pain, back pain, shortness of breath, similar presentation for the same, was admitted for COPD exacerbation and discharged recently.  No increased work of breathing on my exam, differential diagnosis includes ACS, doubt dissection, doubt PE, COPD exacerbation.  Past medical/surgical history that increases complexity of ED encounter: Substance use disorder  Interpretation of Diagnostics I personally reviewed the EKG and my interpretation is as follows: Sinus rhythm without concerning changes from prior  Labs pending  Patient Reassessment and Ultimate Disposition/Management     If workup is reassuring and patient continues to be in no acute distress with normal vital signs suspect she is appropriate for discharge.  Signed out to oncoming provider.  Patient management required discussion with the following services or consulting groups:  None  Complexity of Problems Addressed Acute illness or injury that poses threat of life of bodily function  Additional Data Reviewed and Analyzed Further history obtained from: None  Additional Factors Impacting ED Encounter Risk None  Elmer Sow. [name], MD [facility] Emergency [facility]  Health [email or web address]  Final Clinical Impressions(s) / ED Diagnoses     ICD-10-CM   1. Chest pain, unspecified type  R07.9       ED Discharge Orders     None        Discharge Instructions Discussed with and Provided to Patient:   Discharge Instructions   None      Sabas Sous, MD 12/13/22 [phone]

## 2022-12-13 NOTE — Discharge Instructions (Addendum)
Take ibuprofen and Tylenol as needed for pain.  Follow-up with the cardiology office.  Return to the emergency department for any new or worsening symptoms of concern.

## 2022-12-13 NOTE — ED Provider Notes (Signed)
Care of patient assumed from Dr. [name].  This patient presents with acute onset of chest and back pain.  She was recently admitted to the hospital for COPD exacerbation and discharged yesterday.  She is currently awaiting second troponin.  If reassuring, discharge is anticipated. Physical Exam  BP 130/73 (BP Location: Right Arm)   Pulse 93   Temp 99 F (37.2 C) (Oral)   Resp (!) 21   Ht 5\' 2"  (1.575 m)   Wt 52.8 kg   LMP 01/08/2011   SpO2 100%   BMI 21.29 kg/m   Physical Exam Vitals and nursing note reviewed.  Constitutional:      General: She is not in acute distress.    Appearance: Normal appearance. She is well-developed. She is not toxic-appearing or diaphoretic.  HENT:     Head: Normocephalic and atraumatic.     Right Ear: External ear normal.     Left Ear: External ear normal.     Nose: Nose normal.     Mouth/Throat:     Mouth: Mucous membranes are moist.  Eyes:     Extraocular Movements: Extraocular movements intact.     Conjunctiva/sclera: Conjunctivae normal.  Cardiovascular:     Rate and Rhythm: Normal rate and regular rhythm.  Pulmonary:     Effort: Pulmonary effort is normal. No respiratory distress.  Abdominal:     General: There is no distension.     Palpations: Abdomen is soft.  Musculoskeletal:        General: No swelling. Normal range of motion.     Cervical back: Normal range of motion and neck supple.     Right lower leg: No edema.     Left lower leg: No edema.  Skin:    General: Skin is warm and dry.     Coloration: Skin is not jaundiced or pale.  Neurological:     General: No focal deficit present.     Mental Status: She is alert and oriented to person, place, and time.  Psychiatric:        Mood and Affect: Mood normal.        Behavior: Behavior normal.     Procedures  Procedures  ED Course / MDM    Medical Decision Making Amount and/or Complexity of Data Reviewed Labs: ordered. Radiology: ordered.   On assessment, patient resting  comfortably.  She is on supplemental oxygen by nasal cannula.  She reports that she has oxygen at home to use as needed.  She typically does not use it because she smokes frequently.  Patient is requesting food at this time.  He was given.  Patient ate, drink, and ambulated in the ED without difficulty.  She describes her pain is diffuse throughout her chest wall.  It is worsened with movements.  I suspect musculoskeletal etiology.  Second troponin was reassuring.  Patient was discharged in stable condition.       Gloris Manchester, MD 12/13/22 1014

## 2022-12-13 NOTE — ED Triage Notes (Signed)
Pt BIB [facility] d/t  having mid upper back pain that radiates to chest - onset 1 hour PTA.  Pt is allergic to ASA so was not given.  10/10 pain

## 2022-12-14 LAB — CULTURE, BLOOD (ROUTINE X 2)

## 2022-12-15 LAB — CULTURE, BLOOD (ROUTINE X 2)

## 2022-12-16 ENCOUNTER — Emergency Department (HOSPITAL_COMMUNITY): Payer: 59

## 2022-12-16 ENCOUNTER — Emergency Department (HOSPITAL_COMMUNITY)
Admission: EM | Admit: 2022-12-16 | Discharge: 2022-12-16 | Disposition: A | Discharge status: Home / Self Care | Payer: 59 | Attending: Emergency Medicine | Admitting: Emergency Medicine

## 2022-12-16 ENCOUNTER — Other Ambulatory Visit: Payer: Self-pay

## 2022-12-16 ENCOUNTER — Encounter (HOSPITAL_COMMUNITY): Payer: Self-pay

## 2022-12-16 DIAGNOSIS — J441 Chronic obstructive pulmonary disease with (acute) exacerbation: Secondary | ICD-10-CM | POA: Diagnosis not present

## 2022-12-16 DIAGNOSIS — J439 Emphysema, unspecified: Secondary | ICD-10-CM | POA: Diagnosis not present

## 2022-12-16 DIAGNOSIS — F419 Anxiety disorder, unspecified: Secondary | ICD-10-CM | POA: Diagnosis not present

## 2022-12-16 DIAGNOSIS — I700 Atherosclerosis of aorta: Secondary | ICD-10-CM | POA: Diagnosis not present

## 2022-12-16 DIAGNOSIS — Z20822 Contact with and (suspected) exposure to covid-19: Secondary | ICD-10-CM | POA: Insufficient documentation

## 2022-12-16 DIAGNOSIS — I499 Cardiac arrhythmia, unspecified: Secondary | ICD-10-CM | POA: Diagnosis not present

## 2022-12-16 DIAGNOSIS — E039 Hypothyroidism, unspecified: Secondary | ICD-10-CM | POA: Diagnosis not present

## 2022-12-16 DIAGNOSIS — R079 Chest pain, unspecified: Secondary | ICD-10-CM | POA: Diagnosis not present

## 2022-12-16 DIAGNOSIS — M549 Dorsalgia, unspecified: Secondary | ICD-10-CM | POA: Diagnosis not present

## 2022-12-16 DIAGNOSIS — R6889 Other general symptoms and signs: Secondary | ICD-10-CM | POA: Diagnosis not present

## 2022-12-16 DIAGNOSIS — Z743 Need for continuous supervision: Secondary | ICD-10-CM | POA: Diagnosis not present

## 2022-12-16 DIAGNOSIS — Z79899 Other long term (current) drug therapy: Secondary | ICD-10-CM | POA: Diagnosis not present

## 2022-12-16 DIAGNOSIS — N611 Abscess of the breast and nipple: Secondary | ICD-10-CM | POA: Diagnosis not present

## 2022-12-16 DIAGNOSIS — R062 Wheezing: Secondary | ICD-10-CM | POA: Diagnosis not present

## 2022-12-16 DIAGNOSIS — F17200 Nicotine dependence, unspecified, uncomplicated: Secondary | ICD-10-CM | POA: Diagnosis not present

## 2022-12-16 DIAGNOSIS — R918 Other nonspecific abnormal finding of lung field: Secondary | ICD-10-CM | POA: Diagnosis not present

## 2022-12-16 DIAGNOSIS — I10 Essential (primary) hypertension: Secondary | ICD-10-CM | POA: Diagnosis not present

## 2022-12-16 DIAGNOSIS — R0602 Shortness of breath: Secondary | ICD-10-CM | POA: Diagnosis present

## 2022-12-16 DIAGNOSIS — E1165 Type 2 diabetes mellitus with hyperglycemia: Secondary | ICD-10-CM | POA: Insufficient documentation

## 2022-12-16 DIAGNOSIS — Z0389 Encounter for observation for other suspected diseases and conditions ruled out: Secondary | ICD-10-CM | POA: Diagnosis not present

## 2022-12-16 LAB — URINALYSIS, W/ REFLEX TO CULTURE (INFECTION SUSPECTED)
Bacteria, UA: NONE SEEN
Bilirubin Urine: NEGATIVE
Glucose, UA: NEGATIVE mg/dL
Hgb urine dipstick: NEGATIVE
Ketones, ur: NEGATIVE mg/dL
Leukocytes,Ua: NEGATIVE
Nitrite: NEGATIVE
Protein, ur: NEGATIVE mg/dL
Specific Gravity, Urine: 1.017 (ref 1.005–1.030)
pH: 5 (ref 5.0–8.0)

## 2022-12-16 LAB — CBC WITH DIFFERENTIAL/PLATELET
Abs Immature Granulocytes: 0.02 10*3/uL (ref 0.00–0.07)
Basophils Absolute: 0 10*3/uL (ref 0.0–0.1)
Basophils Relative: 1 %
Eosinophils Absolute: 0.1 10*3/uL (ref 0.0–0.5)
Eosinophils Relative: 2 %
HCT: 31.5 % — ABNORMAL LOW (ref 36.0–46.0)
Hemoglobin: 9.3 g/dL — ABNORMAL LOW (ref 12.0–15.0)
Immature Granulocytes: 0 %
Lymphocytes Relative: 38 %
Lymphs Abs: 2.1 10*3/uL (ref 0.7–4.0)
MCH: 22.4 pg — ABNORMAL LOW (ref 26.0–34.0)
MCHC: 29.5 g/dL — ABNORMAL LOW (ref 30.0–36.0)
MCV: 75.7 fL — ABNORMAL LOW (ref 80.0–100.0)
Monocytes Absolute: 0.4 10*3/uL (ref 0.1–1.0)
Monocytes Relative: 6 %
Neutro Abs: 2.9 10*3/uL (ref 1.7–7.7)
Neutrophils Relative %: 53 %
Platelets: 321 10*3/uL (ref 150–400)
RBC: 4.16 MIL/uL (ref 3.87–5.11)
RDW: 21.8 % — ABNORMAL HIGH (ref 11.5–15.5)
WBC: 5.6 10*3/uL (ref 4.0–10.5)
nRBC: 0 % (ref 0.0–0.2)

## 2022-12-16 LAB — APTT: aPTT: 25 seconds (ref 24–36)

## 2022-12-16 LAB — COMPREHENSIVE METABOLIC PANEL
ALT: 22 U/L (ref 0–44)
AST: 27 U/L (ref 15–41)
Albumin: 3.4 g/dL — ABNORMAL LOW (ref 3.5–5.0)
Alkaline Phosphatase: 105 U/L (ref 38–126)
Anion gap: 11 (ref 5–15)
BUN: 6 mg/dL (ref 6–20)
CO2: 24 mmol/L (ref 22–32)
Calcium: 8.2 mg/dL — ABNORMAL LOW (ref 8.9–10.3)
Chloride: 105 mmol/L (ref 98–111)
Creatinine, Ser: 0.47 mg/dL (ref 0.44–1.00)
GFR, Estimated: >60 mL/min (ref >60)
Glucose, Bld: 191 mg/dL — ABNORMAL HIGH (ref 70–99)
Potassium: 4.1 mmol/L (ref 3.5–5.1)
Sodium: 140 mmol/L (ref 135–145)
Total Bilirubin: 0.5 mg/dL (ref 0.3–1.2)
Total Protein: 6.9 g/dL (ref 6.5–8.1)

## 2022-12-16 LAB — PROTIME-INR
INR: 0.9 (ref 0.8–1.2)
Prothrombin Time: 12.7 seconds (ref 11.4–15.2)

## 2022-12-16 LAB — RAPID URINE DRUG SCREEN, HOSP PERFORMED
Amphetamines: NOT DETECTED
Barbiturates: NOT DETECTED
Benzodiazepines: NOT DETECTED
Cocaine: NOT DETECTED
Opiates: NOT DETECTED
Tetrahydrocannabinol: NOT DETECTED

## 2022-12-16 LAB — CULTURE, BLOOD (ROUTINE X 2)

## 2022-12-16 LAB — LACTIC ACID, PLASMA
Lactic Acid, Venous: 2.6 mmol/L (ref 0.5–1.9)
Lactic Acid, Venous: 2.5 mmol/L (ref 0.5–1.9)

## 2022-12-16 LAB — RESP PANEL BY RT-PCR (RSV, FLU A&B, COVID)  RVPGX2
Influenza A by PCR: NEGATIVE
Influenza B by PCR: NEGATIVE
Resp Syncytial Virus by PCR: NEGATIVE
SARS Coronavirus 2 by RT PCR: NEGATIVE

## 2022-12-16 LAB — ETHANOL: Alcohol, Ethyl (B): 11 mg/dL — ABNORMAL HIGH (ref <10)

## 2022-12-16 MED ORDER — PREDNISONE 50 MG PO TABS: 50.0000 mg | ORAL_TABLET | Freq: Every day | ORAL | 0 refills | Status: DC

## 2022-12-16 MED ORDER — IPRATROPIUM BROMIDE 0.02 % IN SOLN
0.5000 mg | Freq: Once | RESPIRATORY_TRACT | Status: AC
  Administered 2022-12-16: 0.5 mg via RESPIRATORY_TRACT
  Filled 2022-12-16: qty 2.5

## 2022-12-16 MED ORDER — SODIUM CHLORIDE 0.9 % IV BOLUS: 1000.0000 mL | Freq: Once | INTRAVENOUS | Status: DC

## 2022-12-16 MED ORDER — SODIUM CHLORIDE 0.9 % IV SOLN
2.0000 g | Freq: Once | INTRAVENOUS | Status: AC
  Administered 2022-12-16: 2 g via INTRAVENOUS
  Filled 2022-12-16: qty 12.5

## 2022-12-16 MED ORDER — DOXYCYCLINE HYCLATE 100 MG PO CAPS: 100.0000 mg | ORAL_CAPSULE | Freq: Two times a day (BID) | ORAL | 0 refills | Status: DC

## 2022-12-16 MED ORDER — VANCOMYCIN HCL IN DEXTROSE 1-5 GM/200ML-% IV SOLN
1000.0000 mg | Freq: Once | INTRAVENOUS | Status: AC
  Administered 2022-12-16: 1000 mg via INTRAVENOUS
  Filled 2022-12-16: qty 200

## 2022-12-16 MED ORDER — IOHEXOL 350 MG/ML SOLN
75.0000 mL | Freq: Once | INTRAVENOUS | Status: AC | PRN
  Administered 2022-12-16: 75 mL via INTRAVENOUS

## 2022-12-16 MED ORDER — ALBUTEROL SULFATE (2.5 MG/3ML) 0.083% IN NEBU
10.0000 mg/h | INHALATION_SOLUTION | Freq: Once | RESPIRATORY_TRACT | Status: AC
  Administered 2022-12-16: 10 mg/h via RESPIRATORY_TRACT
  Filled 2022-12-16: qty 12

## 2022-12-16 MED ORDER — LACTATED RINGERS IV SOLN: INTRAVENOUS | Status: DC

## 2022-12-16 MED ORDER — ALBUTEROL SULFATE (2.5 MG/3ML) 0.083% IN NEBU
2.5000 mg | INHALATION_SOLUTION | Freq: Once | RESPIRATORY_TRACT | Status: AC
  Administered 2022-12-16: 2.5 mg via RESPIRATORY_TRACT
  Filled 2022-12-16: qty 3

## 2022-12-16 NOTE — ED Notes (Signed)
Patient 02 drop 78% RA,while walking, once came back to bed, resting 02 came back to 99% RA.

## 2022-12-16 NOTE — ED Triage Notes (Signed)
Pt comes in with pain when taking deep breaths and shortness of breath, shortness of breath starting badly today at 0500, pain with inhalation yesterday. Patient is alert, oriented, but appears to be in discomfort. Saying "oh god, oh lord jesus, oh god" over and over again. She is cooperative. EMS has given her 10 mg albuterol and 0.5 atrovent. Patient is more upset about the pain and inability to take a deep breath.

## 2022-12-16 NOTE — ED Provider Notes (Signed)
Pt signed out by Dr. Wilkie Aye pending labs and xrays.  Covid/flu/rsv neg   CBC with hgb 9.3 (stable) CMP with glucose slightly elevated at 191, lactic elevated at 2.6  CXR: No active disease.   Nurse also noticed that pt has an abnormality to her nipple.  She does have an abscess to her nipple.  UA neg  CT chest:   Negative for pulmonary embolism.  2. Persistent but improving nodular opacities, predominantly within  the superior segment of the left lower lobe. Several of the smaller  nodules have since resolved. Findings are favored to represent a  resolving infectious or inflammatory process.  3. Dilated pulmonary trunk, which can be seen in the setting of  pulmonary arterial hypertension.    Aortic Atherosclerosis (ICD10-I70.0) and Emphysema (ICD10-J43.9).    Pt is now feeling much better.  Her O2 sat dropped briefly to 78%, but came up quickly.  She does have home O2 that she can wear prn.  She wants to go home.  CT scan looks like infectious process is resolving.  She was supposed to go to court today.  She is ready to go home.  She is encouraged to stop smoking and drinking.  Nipple abscess is referred to surgery as an outpatient.  Pt is stable for d/c.  Return if worse. F/u with pcp.   Jacalyn Lefevre, MD 12/16/22 1023

## 2022-12-16 NOTE — ED Provider Notes (Signed)
[place] EMERGENCY DEPARTMENT AT [facility] Provider Note   CSN: [id number] Arrival date & time: 12/16/22  0559     History {Add pertinent medical, surgical, social history, OB history to HPI:1} Chief Complaint  Patient presents with   Shortness of Breath    [name] is a 57 y.o. female.  HPI     This is a 57 year old female with a history of COPD with recent exacerbation and hospitalization and several recent visits for chest pain and shortness of breath who presents with worsening shortness of breath.  She states that it is worse now than it has been.  She uses home oxygen "when I am not smoking."  Patient states that she had acute onset of shortness of breath and pain with taking deep breaths.  It is worsened at 0 500.  She was given albuterol and Atrovent by EMS.  She does endorse some chest discomfort.  No noted fevers.  I have reviewed her recent visits. She was seen and had a CT PE study that was concerning for septic emboli on 6/14.  Patient left AGAINST MEDICAL ADVICE at that time.  It is not clear that she went home with antibiotics.  She had a subsequent admission from 6/19 to 6/21 for COPD exacerbation.  She was prescribed a 5-day course of prednisone.  At that time was not felt to be infectious.  Home Medications Prior to Admission medications   Medication Sig Start Date End Date Taking? Authorizing Provider  budesonide-formoterol (SYMBICORT) 160-4.5 MCG/ACT inhaler Inhale 2 puffs into the lungs 2 (two) times daily. 12/01/22   Ghimire, Werner Lean, MD  guaiFENesin (MUCINEX) 600 MG 12 hr tablet Take 1 tablet (600 mg total) by mouth 2 (two) times daily for 10 days. 12/12/22 12/22/22  Sherryll Burger, Pratik D, DO  Ipratropium-Albuterol (COMBIVENT RESPIMAT) 20-100 MCG/ACT AERS respimat Inhale 1 puff into the lungs every 6 (six) hours as needed for wheezing or shortness of breath. 12/12/22   Sherryll Burger, Pratik D, DO  metFORMIN (GLUCOPHAGE) 500 MG tablet Take 1 tablet (500 mg total)  by mouth 2 (two) times daily with a meal. Patient not taking: Reported on 12/11/2022 12/01/22   Maretta Bees, MD  metoprolol tartrate (LOPRESSOR) 25 MG tablet Take 1 tablet (25 mg total) by mouth 2 (two) times daily. Patient not taking: Reported on 12/11/2022 12/01/22   Maretta Bees, MD  OLANZapine (ZYPREXA) 10 MG tablet Take 1 tablet (10 mg total) by mouth at bedtime. Patient not taking: Reported on 12/11/2022 12/01/22   Maretta Bees, MD  predniSONE (DELTASONE) 10 MG tablet Take 4 tablets (40 mg total) by mouth daily for 5 days. 12/12/22 12/17/22  Sherryll Burger, Pratik D, DO  thiamine (VITAMIN B1) 100 MG tablet Take 1 tablet (100 mg total) by mouth daily. Patient not taking: Reported on 12/11/2022 12/02/22   Maretta Bees, MD      Allergies    Iron dextran, Aspirin, and Penicillins    Review of Systems   Review of Systems  Constitutional:  Negative for fever.  Respiratory:  Positive for shortness of breath.   Cardiovascular:  Positive for chest pain. Negative for leg swelling.  All other systems reviewed and are negative.   Physical Exam Updated Vital Signs BP (!) 154/81 (BP Location: Right Arm)   Pulse (!) 109   Temp 99.1 F (37.3 C) (Axillary)   Resp (!) 22   LMP 01/08/2011   SpO2 100%  Physical Exam Vitals and nursing note  reviewed.  Constitutional:      Appearance: She is well-developed. She is ill-appearing.  HENT:     Head: Normocephalic and atraumatic.  Eyes:     Pupils: Pupils are equal, round, and reactive to light.  Cardiovascular:     Rate and Rhythm: Normal rate and regular rhythm.     Heart sounds: Normal heart sounds.  Pulmonary:     Effort: Pulmonary effort is normal. Tachypnea present. No respiratory distress.     Breath sounds: Wheezing present.     Comments: Speaking in short sentences, wheezing in all lung fields with poor air movement Abdominal:     General: Bowel sounds are normal.     Palpations: Abdomen is soft.  Musculoskeletal:      Cervical back: Neck supple.  Skin:    General: Skin is warm and dry.  Neurological:     Mental Status: She is alert and oriented to person, place, and time.  Psychiatric:     Comments: Anxious appearing     ED Results / Procedures / Treatments   Labs (all labs ordered are listed, but only abnormal results are displayed) Labs Reviewed  RESP PANEL BY RT-PCR (RSV, FLU A&B, COVID)  RVPGX2  CULTURE, BLOOD (ROUTINE X 2)  CULTURE, BLOOD (ROUTINE X 2)  LACTIC ACID, PLASMA  LACTIC ACID, PLASMA  COMPREHENSIVE METABOLIC PANEL  CBC WITH DIFFERENTIAL/PLATELET  PROTIME-INR  APTT  URINALYSIS, W/ REFLEX TO CULTURE (INFECTION SUSPECTED)    EKG EKG Interpretation  Date/Time:  Tuesday December 16 2022 06:07:17 EDT Ventricular Rate:  109 PR Interval:  114 QRS Duration: 88 QT Interval:  330 QTC Calculation: 445 R Axis:   8 Text Interpretation: Sinus tachycardia Confirmed by Ross Marcus ([id number]) on 12/16/2022 6:26:36 AM  Radiology No results found.  Procedures Procedures  {Document cardiac monitor, telemetry assessment procedure when appropriate:1}  Medications Ordered in ED Medications  lactated ringers infusion (has no administration in time range)  vancomycin (VANCOCIN) IVPB 1000 mg/200 mL premix (has no administration in time range)  ceFEPIme (MAXIPIME) 2 g in sodium chloride 0.9 % 100 mL IVPB (has no administration in time range)  albuterol (PROVENTIL,VENTOLIN) solution continuous neb (has no administration in time range)  ipratropium (ATROVENT) nebulizer solution 0.5 mg (has no administration in time range)    ED Course/ Medical Decision Making/ A&P   {   Click here for ABCD2, HEART and other calculatorsREFRESH Note before signing :1}                          Medical Decision Making Amount and/or Complexity of Data Reviewed Labs: ordered. Radiology: ordered. ECG/medicine tests: ordered.  Risk Prescription drug management.   ***  {Document critical care time when  appropriate:1} {Document review of labs and clinical decision tools ie heart score, Chads2Vasc2 etc:1}  {Document your independent review of radiology images, and any outside records:1} {Document your discussion with family members, caretakers, and with consultants:1} {Document social determinants of health affecting pt's care:1} {Document your decision making why or why not admission, treatments were needed:1} Final Clinical Impression(s) / ED Diagnoses Final diagnoses:  None    Rx / DC Orders ED Discharge Orders     None

## 2022-12-16 NOTE — Sepsis Progress Note (Signed)
Elink following for sepsis protocol. 

## 2022-12-17 DIAGNOSIS — R54 Age-related physical debility: Secondary | ICD-10-CM | POA: Diagnosis not present

## 2022-12-17 DIAGNOSIS — E1142 Type 2 diabetes mellitus with diabetic polyneuropathy: Secondary | ICD-10-CM | POA: Diagnosis not present

## 2022-12-17 DIAGNOSIS — Z79899 Other long term (current) drug therapy: Secondary | ICD-10-CM | POA: Diagnosis not present

## 2022-12-17 DIAGNOSIS — J449 Chronic obstructive pulmonary disease, unspecified: Secondary | ICD-10-CM | POA: Diagnosis not present

## 2022-12-17 DIAGNOSIS — Z599 Problem related to housing and economic circumstances, unspecified: Secondary | ICD-10-CM | POA: Diagnosis not present

## 2022-12-17 DIAGNOSIS — Z1211 Encounter for screening for malignant neoplasm of colon: Secondary | ICD-10-CM | POA: Diagnosis not present

## 2022-12-17 DIAGNOSIS — Z0289 Encounter for other administrative examinations: Secondary | ICD-10-CM | POA: Diagnosis not present

## 2022-12-17 DIAGNOSIS — D692 Other nonthrombocytopenic purpura: Secondary | ICD-10-CM | POA: Diagnosis not present

## 2022-12-17 DIAGNOSIS — Z5948 Other specified lack of adequate food: Secondary | ICD-10-CM | POA: Diagnosis not present

## 2022-12-17 LAB — CULTURE, BLOOD (ROUTINE X 2): Culture: NO GROWTH

## 2022-12-18 LAB — CULTURE, BLOOD (ROUTINE X 2): Culture: NO GROWTH

## 2022-12-19 LAB — CULTURE, BLOOD (ROUTINE X 2)

## 2022-12-20 LAB — CULTURE, BLOOD (ROUTINE X 2): Special Requests: ADEQUATE

## 2022-12-21 LAB — CULTURE, BLOOD (ROUTINE X 2): Special Requests: ADEQUATE

## 2022-12-23 ENCOUNTER — Telehealth: Payer: Self-pay

## 2022-12-23 NOTE — Telephone Encounter (Signed)
Transition Care Management Unsuccessful Follow-up Telephone Call  Date of discharge and from where:  12/16/2022 [facility]  Attempts:  1st Attempt  Reason for unsuccessful TCM follow-up call:  Left voice message  [name]  [facility] Population Health Community Resource Care Guide   [email or web address]  ?? [phone]   Website: [email or web address]  [email or web address]

## 2022-12-23 NOTE — Telephone Encounter (Signed)
Transition Care Management Unsuccessful Follow-up Telephone Call  Date of discharge and from where:  12/16/2022 [facility]  Attempts:  2nd Attempt  Reason for unsuccessful TCM follow-up call:  No answer/busy  [name]  [facility] Population Health Community Resource Care Guide   [email or web address]  ?? [phone]   Website: [email or web address]  [email or web address]

## 2022-12-24 DIAGNOSIS — J449 Chronic obstructive pulmonary disease, unspecified: Secondary | ICD-10-CM | POA: Diagnosis not present

## 2022-12-30 DIAGNOSIS — I10 Essential (primary) hypertension: Secondary | ICD-10-CM | POA: Diagnosis not present

## 2022-12-30 DIAGNOSIS — Z5948 Other specified lack of adequate food: Secondary | ICD-10-CM | POA: Diagnosis not present

## 2022-12-30 DIAGNOSIS — D692 Other nonthrombocytopenic purpura: Secondary | ICD-10-CM | POA: Diagnosis not present

## 2022-12-30 DIAGNOSIS — J449 Chronic obstructive pulmonary disease, unspecified: Secondary | ICD-10-CM | POA: Diagnosis not present

## 2022-12-30 DIAGNOSIS — D509 Iron deficiency anemia, unspecified: Secondary | ICD-10-CM | POA: Diagnosis not present

## 2022-12-30 DIAGNOSIS — Z0289 Encounter for other administrative examinations: Secondary | ICD-10-CM | POA: Diagnosis not present

## 2022-12-30 DIAGNOSIS — R54 Age-related physical debility: Secondary | ICD-10-CM | POA: Diagnosis not present

## 2022-12-30 DIAGNOSIS — Z9884 Bariatric surgery status: Secondary | ICD-10-CM | POA: Diagnosis not present

## 2022-12-30 DIAGNOSIS — K922 Gastrointestinal hemorrhage, unspecified: Secondary | ICD-10-CM | POA: Diagnosis not present

## 2022-12-30 DIAGNOSIS — B370 Candidal stomatitis: Secondary | ICD-10-CM | POA: Diagnosis not present

## 2022-12-30 DIAGNOSIS — R109 Unspecified abdominal pain: Secondary | ICD-10-CM | POA: Diagnosis not present

## 2022-12-30 DIAGNOSIS — Z599 Problem related to housing and economic circumstances, unspecified: Secondary | ICD-10-CM | POA: Diagnosis not present

## 2022-12-30 DIAGNOSIS — E1142 Type 2 diabetes mellitus with diabetic polyneuropathy: Secondary | ICD-10-CM | POA: Diagnosis not present

## 2023-01-01 ENCOUNTER — Emergency Department (HOSPITAL_COMMUNITY): Payer: 59

## 2023-01-01 ENCOUNTER — Other Ambulatory Visit: Payer: Self-pay

## 2023-01-01 ENCOUNTER — Emergency Department (HOSPITAL_COMMUNITY)
Admission: EM | Admit: 2023-01-01 | Discharge: 2023-01-01 | Disposition: A | Discharge status: Home / Self Care | Payer: 59 | Attending: Emergency Medicine | Admitting: Emergency Medicine

## 2023-01-01 DIAGNOSIS — E1140 Type 2 diabetes mellitus with diabetic neuropathy, unspecified: Secondary | ICD-10-CM | POA: Insufficient documentation

## 2023-01-01 DIAGNOSIS — Z743 Need for continuous supervision: Secondary | ICD-10-CM | POA: Diagnosis not present

## 2023-01-01 DIAGNOSIS — Z7952 Long term (current) use of systemic steroids: Secondary | ICD-10-CM | POA: Insufficient documentation

## 2023-01-01 DIAGNOSIS — R000 Tachycardia, unspecified: Secondary | ICD-10-CM | POA: Insufficient documentation

## 2023-01-01 DIAGNOSIS — R062 Wheezing: Secondary | ICD-10-CM | POA: Diagnosis not present

## 2023-01-01 DIAGNOSIS — I499 Cardiac arrhythmia, unspecified: Secondary | ICD-10-CM | POA: Diagnosis not present

## 2023-01-01 DIAGNOSIS — J449 Chronic obstructive pulmonary disease, unspecified: Secondary | ICD-10-CM | POA: Diagnosis not present

## 2023-01-01 DIAGNOSIS — M549 Dorsalgia, unspecified: Secondary | ICD-10-CM | POA: Diagnosis not present

## 2023-01-01 DIAGNOSIS — Z7984 Long term (current) use of oral hypoglycemic drugs: Secondary | ICD-10-CM | POA: Insufficient documentation

## 2023-01-01 DIAGNOSIS — I10 Essential (primary) hypertension: Secondary | ICD-10-CM | POA: Diagnosis not present

## 2023-01-01 DIAGNOSIS — Z79899 Other long term (current) drug therapy: Secondary | ICD-10-CM | POA: Diagnosis not present

## 2023-01-01 DIAGNOSIS — Z7951 Long term (current) use of inhaled steroids: Secondary | ICD-10-CM | POA: Diagnosis not present

## 2023-01-01 DIAGNOSIS — F419 Anxiety disorder, unspecified: Secondary | ICD-10-CM | POA: Diagnosis not present

## 2023-01-01 DIAGNOSIS — R0602 Shortness of breath: Secondary | ICD-10-CM | POA: Diagnosis not present

## 2023-01-01 DIAGNOSIS — I517 Cardiomegaly: Secondary | ICD-10-CM | POA: Diagnosis not present

## 2023-01-01 DIAGNOSIS — F17200 Nicotine dependence, unspecified, uncomplicated: Secondary | ICD-10-CM | POA: Diagnosis not present

## 2023-01-01 DIAGNOSIS — I700 Atherosclerosis of aorta: Secondary | ICD-10-CM | POA: Diagnosis not present

## 2023-01-01 DIAGNOSIS — J439 Emphysema, unspecified: Secondary | ICD-10-CM | POA: Diagnosis not present

## 2023-01-01 DIAGNOSIS — E039 Hypothyroidism, unspecified: Secondary | ICD-10-CM | POA: Diagnosis not present

## 2023-01-01 LAB — BASIC METABOLIC PANEL
Anion gap: 15 (ref 5–15)
BUN: 7 mg/dL (ref 6–20)
CO2: 19 mmol/L — ABNORMAL LOW (ref 22–32)
Calcium: 9.3 mg/dL (ref 8.9–10.3)
Chloride: 99 mmol/L (ref 98–111)
Creatinine, Ser: 0.54 mg/dL (ref 0.44–1.00)
GFR, Estimated: >60 mL/min (ref >60)
Glucose, Bld: 175 mg/dL — ABNORMAL HIGH (ref 70–99)
Potassium: 5.3 mmol/L — ABNORMAL HIGH (ref 3.5–5.1)
Sodium: 133 mmol/L — ABNORMAL LOW (ref 135–145)

## 2023-01-01 LAB — TROPONIN I (HIGH SENSITIVITY): Troponin I (High Sensitivity): 6 ng/L (ref <18)

## 2023-01-01 MED ORDER — OXYCODONE-ACETAMINOPHEN 5-325 MG PO TABS
1.0000 | ORAL_TABLET | Freq: Once | ORAL | Status: AC
  Administered 2023-01-01: 1 via ORAL
  Filled 2023-01-01: qty 1

## 2023-01-01 MED ORDER — IPRATROPIUM-ALBUTEROL 0.5-2.5 (3) MG/3ML IN SOLN
3.0000 mL | Freq: Once | RESPIRATORY_TRACT | Status: AC
  Administered 2023-01-01: 3 mL via RESPIRATORY_TRACT
  Filled 2023-01-01: qty 9

## 2023-01-01 NOTE — ED Triage Notes (Signed)
Pt from home c/o back pain and sob. 4Lo2 baseline. Hx copd and anxiety. DM2

## 2023-01-01 NOTE — ED Provider Notes (Signed)
[place] EMERGENCY DEPARTMENT AT [facility] Provider Note   CSN: [id number] Arrival date & time: 01/01/23  0331     History  Chief Complaint  Patient presents with   Shortness of Breath    [name] is a 57 y.o. female.  HPI     This is a 57 year old female well-known to our emergency department who presents with shortness of breath.  Patient states she has felt more short of breath and has had some back discomfort.  Onset this morning.  She took one of her breathing treatments at home to see if it helped.  She has not had any fevers or cough.  Home Medications Prior to Admission medications   Medication Sig Start Date End Date Taking? Authorizing Provider  budesonide-formoterol (SYMBICORT) 160-4.5 MCG/ACT inhaler Inhale 2 puffs into the lungs 2 (two) times daily. 12/01/22   Ghimire, Werner Lean, MD  doxycycline (VIBRAMYCIN) 100 MG capsule Take 1 capsule (100 mg total) by mouth 2 (two) times daily. 12/16/22   Jacalyn Lefevre, MD  Ipratropium-Albuterol (COMBIVENT RESPIMAT) 20-100 MCG/ACT AERS respimat Inhale 1 puff into the lungs every 6 (six) hours as needed for wheezing or shortness of breath. 12/12/22   Sherryll Burger, Pratik D, DO  metFORMIN (GLUCOPHAGE) 500 MG tablet Take 1 tablet (500 mg total) by mouth 2 (two) times daily with a meal. Patient not taking: Reported on 12/11/2022 12/01/22   Maretta Bees, MD  metoprolol tartrate (LOPRESSOR) 25 MG tablet Take 1 tablet (25 mg total) by mouth 2 (two) times daily. Patient not taking: Reported on 12/11/2022 12/01/22   Maretta Bees, MD  OLANZapine (ZYPREXA) 10 MG tablet Take 1 tablet (10 mg total) by mouth at bedtime. Patient not taking: Reported on 12/11/2022 12/01/22   Maretta Bees, MD  predniSONE (DELTASONE) 50 MG tablet Take 1 tablet (50 mg total) by mouth daily with breakfast. 12/16/22   Jacalyn Lefevre, MD  thiamine (VITAMIN B1) 100 MG tablet Take 1 tablet (100 mg total) by mouth daily. Patient not taking:  Reported on 12/11/2022 12/02/22   Maretta Bees, MD      Allergies    Iron dextran, Aspirin, and Penicillins    Review of Systems   Review of Systems  Constitutional:  Negative for fever.  Respiratory:  Positive for shortness of breath. Negative for cough.   Cardiovascular:  Negative for chest pain.  Musculoskeletal:  Positive for back pain.  All other systems reviewed and are negative.   Physical Exam Updated Vital Signs BP (!) 162/84 (BP Location: Right Arm)   Pulse (!) 102   Temp 98 F (36.7 C) (Oral)   Resp 20   Ht 1.575 m (5\' 2" )   Wt 49.9 kg   LMP 01/08/2011   SpO2 100%   BMI 20.12 kg/m  Physical Exam Vitals and nursing note reviewed.  Constitutional:      Appearance: She is well-developed.  HENT:     Head: Normocephalic and atraumatic.  Eyes:     Pupils: Pupils are equal, round, and reactive to light.  Cardiovascular:     Rate and Rhythm: Regular rhythm. Tachycardia present.     Heart sounds: Normal heart sounds.  Pulmonary:     Effort: Pulmonary effort is normal. No respiratory distress.     Breath sounds: Wheezing present.  Abdominal:     General: Bowel sounds are normal.     Palpations: Abdomen is soft.  Musculoskeletal:     Cervical back: Neck supple.  Skin:    General: Skin is warm and dry.  Neurological:     Mental Status: She is alert and oriented to person, place, and time.  Psychiatric:        Mood and Affect: Mood is anxious.     ED Results / Procedures / Treatments   Labs (all labs ordered are listed, but only abnormal results are displayed) Labs Reviewed  BASIC METABOLIC PANEL - Abnormal; Notable for the following components:      Result Value   Sodium 133 (*)    Potassium 5.3 (*)    CO2 19 (*)    Glucose, Bld 175 (*)    All other components within normal limits  CBC WITH DIFFERENTIAL/PLATELET  CBC WITH DIFFERENTIAL/PLATELET  TROPONIN I (HIGH SENSITIVITY)  TROPONIN I (HIGH SENSITIVITY)    EKG EKG  Interpretation Date/Time:  Thursday January 01 2023 04:15:47 EDT Ventricular Rate:  102 PR Interval:  144 QRS Duration:  95 QT Interval:  337 QTC Calculation: 439 R Axis:   50  Text Interpretation: Sinus tachycardia Ventricular premature complex Probable left atrial enlargement Confirmed by Ross Marcus ([id number]) on 01/01/2023 5:17:41 AM  Radiology DG Chest Portable 1 View  Result Date: 01/01/2023 CLINICAL DATA:  Shortness of breath. EXAM: PORTABLE CHEST 1 VIEW COMPARISON:  CTA chest and portable chest both 12/16/2022 FINDINGS: The lungs are slightly emphysematous but clear. No pleural effusion is seen. There is mild cardiomegaly without evidence of CHF. The mediastinum is normally outlined. Mild aortic atherosclerosis. There is slight thoracic dextroscoliosis and mild thoracic spondylosis. Multiple overlying monitor wires. IMPRESSION: No evidence of acute chest disease.  Emphysema. Mild cardiomegaly. Aortic atherosclerosis. Electronically Signed   By: Almira Bar M.D.   On: 01/01/2023 04:54    Procedures Procedures    Medications Ordered in ED Medications  ipratropium-albuterol (DUONEB) 0.5-2.5 (3) MG/3ML nebulizer solution 3 mL (3 mLs Nebulization Given 01/01/23 0424)  oxyCODONE-acetaminophen (PERCOCET/ROXICET) 5-325 MG per tablet 1 tablet (1 tablet Oral Given 01/01/23 0521)    ED Course/ Medical Decision Making/ A&P Clinical Course as of 01/01/23 0609  Thu Jan 01, 2023  6045 Patient reports that she is feeling better.  Will ambulate with pulse ox. [CH]    Clinical Course User Index [CH] Verble Styron, Mayer Masker, MD                             Medical Decision Making Amount and/or Complexity of Data Reviewed Labs: ordered. Radiology: ordered.  Risk Prescription drug management.   This patient presents to the ED for concern of shortness of breath, this involves an extensive number of treatment options, and is a complaint that carries with it a high risk of complications and  morbidity.  I considered the following differential and admission for this acute, potentially life threatening condition.  The differential diagnosis includes pneumonia, COPD, PE, ACS, pneumothorax  MDM:    This is a 58 year old female who presents with shortness of breath and back pain.  She has been seen and evaluated multiple times recently for the same.  She is anxious appearing but nontoxic.  No respiratory distress.  Occasional wheeze on exam.  Patient was administered an neb and steroids.  Labs notable for a potassium of 5.3 but this is in the setting of hemolysis.  Chest x-ray without pneumothorax or pneumonia.  EKG is nonischemic.  Patient had gradual improvement during her ER stay.  On last recheck, she was able to ambulate  with pulse ox at 92%.  I was slated to discharge the patient but she "left AGAINST MEDICAL ADVICE" per the nurse.  (Labs, imaging, consults)  Labs: I Ordered, and personally interpreted labs.  The pertinent results include: CBC, BMP  Imaging Studies ordered: I ordered imaging studies including chest x-ray I independently visualized and interpreted imaging. I agree with the radiologist interpretation  Additional history obtained from chart review.  External records from outside source obtained and reviewed including evaluations  Cardiac Monitoring: The patient was maintained on a cardiac monitor.  If on the cardiac monitor, I personally viewed and interpreted the cardiac monitored which showed an underlying rhythm of: Sinus rhythm  Reevaluation: After the interventions noted above, I reevaluated the patient and found that they have :improved  Social Determinants of Health:  lives independently  Disposition: Discharge  Co morbidities that complicate the patient evaluation  Past Medical History:  Diagnosis Date   Abdominal pain    Accidental drug overdose April 2013   Anxiety    Atrial fibrillation (HCC) 09/29/11   converted spontaneously   Chronic back  pain    Chronic knee pain    Chronic nausea    Chronic pain    COPD (chronic obstructive pulmonary disease) (HCC)    Depression    Diabetes mellitus    states her doctor took her off all DM meds in past month   Diabetic neuropathy (HCC)    Dyspnea    with exertion    GERD (gastroesophageal reflux disease)    Headache(784.0)    migraines    HTN (hypertension)    not on meds since in a year    Hyperlipidemia    Hypothyroidism    not on meds in a while    Mental disorder    Bipolar and schizophrenic   Requires supplemental oxygen    as needed per patient    Schizophrenia (HCC)    Schizophrenia, acute (HCC) 11/13/2017   Tobacco abuse      Medicines Meds ordered this encounter  Medications   ipratropium-albuterol (DUONEB) 0.5-2.5 (3) MG/3ML nebulizer solution 3 mL   oxyCODONE-acetaminophen (PERCOCET/ROXICET) 5-325 MG per tablet 1 tablet    I have reviewed the patients home medicines and have made adjustments as needed  Problem List / ED Course: Problem List Items Addressed This Visit   None Visit Diagnoses     Shortness of breath    -  Primary                   Final Clinical Impression(s) / ED Diagnoses Final diagnoses:  Shortness of breath    Rx / DC Orders ED Discharge Orders     None         Shon Baton, MD 01/01/23 [phone]

## 2023-01-02 ENCOUNTER — Encounter (HOSPITAL_COMMUNITY): Payer: Self-pay

## 2023-01-02 ENCOUNTER — Emergency Department (HOSPITAL_COMMUNITY)
Admission: EM | Admit: 2023-01-02 | Discharge: 2023-01-02 | Discharge status: Against Medical Advice | Payer: 59 | Attending: Emergency Medicine | Admitting: Emergency Medicine

## 2023-01-02 ENCOUNTER — Emergency Department (EMERGENCY_DEPARTMENT_HOSPITAL)
Admission: EM | Admit: 2023-01-02 | Discharge: 2023-01-04 | Disposition: A | Discharge status: Home / Self Care | Payer: 59 | Source: Home / Self Care | Attending: Emergency Medicine | Admitting: Emergency Medicine

## 2023-01-02 ENCOUNTER — Emergency Department (HOSPITAL_COMMUNITY): Payer: 59

## 2023-01-02 ENCOUNTER — Emergency Department (HOSPITAL_COMMUNITY)
Admission: EM | Admit: 2023-01-02 | Discharge: 2023-01-02 | Disposition: A | Discharge status: Home / Self Care | Payer: 59 | Source: Home / Self Care | Attending: Emergency Medicine | Admitting: Emergency Medicine

## 2023-01-02 ENCOUNTER — Other Ambulatory Visit: Payer: Self-pay

## 2023-01-02 DIAGNOSIS — I10 Essential (primary) hypertension: Secondary | ICD-10-CM | POA: Insufficient documentation

## 2023-01-02 DIAGNOSIS — R634 Abnormal weight loss: Secondary | ICD-10-CM | POA: Insufficient documentation

## 2023-01-02 DIAGNOSIS — F1914 Other psychoactive substance abuse with psychoactive substance-induced mood disorder: Secondary | ICD-10-CM | POA: Insufficient documentation

## 2023-01-02 DIAGNOSIS — F10220 Alcohol dependence with intoxication, uncomplicated: Secondary | ICD-10-CM | POA: Insufficient documentation

## 2023-01-02 DIAGNOSIS — Z860100 Personal history of colon polyps, unspecified: Secondary | ICD-10-CM | POA: Insufficient documentation

## 2023-01-02 DIAGNOSIS — I499 Cardiac arrhythmia, unspecified: Secondary | ICD-10-CM | POA: Diagnosis not present

## 2023-01-02 DIAGNOSIS — R45851 Suicidal ideations: Secondary | ICD-10-CM

## 2023-01-02 DIAGNOSIS — F1994 Other psychoactive substance use, unspecified with psychoactive substance-induced mood disorder: Secondary | ICD-10-CM | POA: Diagnosis present

## 2023-01-02 DIAGNOSIS — D649 Anemia, unspecified: Secondary | ICD-10-CM | POA: Insufficient documentation

## 2023-01-02 DIAGNOSIS — F17210 Nicotine dependence, cigarettes, uncomplicated: Secondary | ICD-10-CM | POA: Insufficient documentation

## 2023-01-02 DIAGNOSIS — E119 Type 2 diabetes mellitus without complications: Secondary | ICD-10-CM | POA: Insufficient documentation

## 2023-01-02 DIAGNOSIS — Y908 Blood alcohol level of 240 mg/100 ml or more: Secondary | ICD-10-CM | POA: Insufficient documentation

## 2023-01-02 DIAGNOSIS — L299 Pruritus, unspecified: Secondary | ICD-10-CM | POA: Insufficient documentation

## 2023-01-02 DIAGNOSIS — R062 Wheezing: Secondary | ICD-10-CM | POA: Diagnosis not present

## 2023-01-02 DIAGNOSIS — E039 Hypothyroidism, unspecified: Secondary | ICD-10-CM | POA: Insufficient documentation

## 2023-01-02 DIAGNOSIS — R0602 Shortness of breath: Secondary | ICD-10-CM | POA: Insufficient documentation

## 2023-01-02 DIAGNOSIS — Z743 Need for continuous supervision: Secondary | ICD-10-CM | POA: Diagnosis not present

## 2023-01-02 DIAGNOSIS — F1010 Alcohol abuse, uncomplicated: Secondary | ICD-10-CM

## 2023-01-02 DIAGNOSIS — R4589 Other symptoms and signs involving emotional state: Secondary | ICD-10-CM

## 2023-01-02 DIAGNOSIS — E1165 Type 2 diabetes mellitus with hyperglycemia: Secondary | ICD-10-CM | POA: Insufficient documentation

## 2023-01-02 DIAGNOSIS — J449 Chronic obstructive pulmonary disease, unspecified: Secondary | ICD-10-CM | POA: Insufficient documentation

## 2023-01-02 DIAGNOSIS — J9811 Atelectasis: Secondary | ICD-10-CM | POA: Diagnosis not present

## 2023-01-02 DIAGNOSIS — Z5321 Procedure and treatment not carried out due to patient leaving prior to being seen by health care provider: Secondary | ICD-10-CM | POA: Diagnosis not present

## 2023-01-02 DIAGNOSIS — F1020 Alcohol dependence, uncomplicated: Secondary | ICD-10-CM | POA: Insufficient documentation

## 2023-01-02 DIAGNOSIS — F10129 Alcohol abuse with intoxication, unspecified: Secondary | ICD-10-CM | POA: Diagnosis present

## 2023-01-02 DIAGNOSIS — R112 Nausea with vomiting, unspecified: Secondary | ICD-10-CM | POA: Insufficient documentation

## 2023-01-02 DIAGNOSIS — R739 Hyperglycemia, unspecified: Secondary | ICD-10-CM | POA: Diagnosis not present

## 2023-01-02 HISTORY — DX: Nausea with vomiting, unspecified: R11.2

## 2023-01-02 LAB — COMPREHENSIVE METABOLIC PANEL
ALT: 22 U/L (ref 0–44)
AST: 22 U/L (ref 15–41)
Albumin: 3.6 g/dL (ref 3.5–5.0)
Alkaline Phosphatase: 123 U/L (ref 38–126)
Anion gap: 12 (ref 5–15)
BUN: 6 mg/dL (ref 6–20)
CO2: 18 mmol/L — ABNORMAL LOW (ref 22–32)
Calcium: 8.9 mg/dL (ref 8.9–10.3)
Chloride: 104 mmol/L (ref 98–111)
Creatinine, Ser: 0.42 mg/dL — ABNORMAL LOW (ref 0.44–1.00)
GFR, Estimated: >60 mL/min (ref >60)
Glucose, Bld: 238 mg/dL — ABNORMAL HIGH (ref 70–99)
Potassium: 3.8 mmol/L (ref 3.5–5.1)
Sodium: 134 mmol/L — ABNORMAL LOW (ref 135–145)
Total Bilirubin: 0.5 mg/dL (ref 0.3–1.2)
Total Protein: 7.4 g/dL (ref 6.5–8.1)

## 2023-01-02 LAB — URINALYSIS, ROUTINE W REFLEX MICROSCOPIC
Bilirubin Urine: NEGATIVE
Glucose, UA: NEGATIVE mg/dL
Hgb urine dipstick: NEGATIVE
Ketones, ur: NEGATIVE mg/dL
Leukocytes,Ua: NEGATIVE
Nitrite: NEGATIVE
Protein, ur: NEGATIVE mg/dL
Specific Gravity, Urine: 1.003 — ABNORMAL LOW (ref 1.005–1.030)
pH: 6 (ref 5.0–8.0)

## 2023-01-02 LAB — CBC WITH DIFFERENTIAL/PLATELET
Abs Immature Granulocytes: 0.1 10*3/uL — ABNORMAL HIGH (ref 0.00–0.07)
Basophils Absolute: 0 10*3/uL (ref 0.0–0.1)
Basophils Relative: 0 %
Eosinophils Absolute: 0 10*3/uL (ref 0.0–0.5)
Eosinophils Relative: 0 %
HCT: 27.5 % — ABNORMAL LOW (ref 36.0–46.0)
Hemoglobin: 8.4 g/dL — ABNORMAL LOW (ref 12.0–15.0)
Lymphocytes Relative: 13 %
Lymphs Abs: 1.1 10*3/uL (ref 0.7–4.0)
MCH: 24.1 pg — ABNORMAL LOW (ref 26.0–34.0)
MCHC: 30.5 g/dL (ref 30.0–36.0)
MCV: 79 fL — ABNORMAL LOW (ref 80.0–100.0)
Monocytes Absolute: 0.4 10*3/uL (ref 0.1–1.0)
Monocytes Relative: 4 %
Myelocytes: 1 %
Neutro Abs: 7.2 10*3/uL (ref 1.7–7.7)
Neutrophils Relative %: 82 %
Platelets: 264 10*3/uL (ref 150–400)
RBC: 3.48 MIL/uL — ABNORMAL LOW (ref 3.87–5.11)
RDW: 22.2 % — ABNORMAL HIGH (ref 11.5–15.5)
WBC: 8.8 10*3/uL (ref 4.0–10.5)
nRBC: 0 % (ref 0.0–0.2)
nRBC: 0 /100 WBC

## 2023-01-02 LAB — I-STAT VENOUS BLOOD GAS, ED
Acid-base deficit: 2 mmol/L (ref 0.0–2.0)
Bicarbonate: 22.1 mmol/L (ref 20.0–28.0)
Calcium, Ion: 1.12 mmol/L — ABNORMAL LOW (ref 1.15–1.40)
HCT: 32 % — ABNORMAL LOW (ref 36.0–46.0)
Hemoglobin: 10.9 g/dL — ABNORMAL LOW (ref 12.0–15.0)
O2 Saturation: 90 %
Potassium: 4.3 mmol/L (ref 3.5–5.1)
Sodium: 138 mmol/L (ref 135–145)
TCO2: 23 mmol/L (ref 22–32)
pCO2, Ven: 35.5 mmHg — ABNORMAL LOW (ref 44–60)
pH, Ven: 7.402 (ref 7.25–7.43)
pO2, Ven: 58 mmHg — ABNORMAL HIGH (ref 32–45)

## 2023-01-02 LAB — RAPID URINE DRUG SCREEN, HOSP PERFORMED
Amphetamines: NOT DETECTED
Barbiturates: NOT DETECTED
Benzodiazepines: NOT DETECTED
Cocaine: NOT DETECTED
Opiates: NOT DETECTED
Tetrahydrocannabinol: NOT DETECTED

## 2023-01-02 LAB — TROPONIN I (HIGH SENSITIVITY): Troponin I (High Sensitivity): 3 ng/L (ref <18)

## 2023-01-02 LAB — ETHANOL: Alcohol, Ethyl (B): 266 mg/dL — ABNORMAL HIGH (ref <10)

## 2023-01-02 MED ORDER — LORAZEPAM 1 MG PO TABS
0.5000 mg | ORAL_TABLET | Freq: Once | ORAL | Status: AC
  Administered 2023-01-02: 0.5 mg via ORAL
  Filled 2023-01-02: qty 1

## 2023-01-02 MED ORDER — LACTATED RINGERS IV BOLUS: 1000.0000 mL | Freq: Once | INTRAVENOUS | Status: DC

## 2023-01-02 MED ORDER — DIPHENHYDRAMINE HCL 25 MG PO CAPS
25.0000 mg | ORAL_CAPSULE | Freq: Once | ORAL | Status: AC
  Administered 2023-01-02: 25 mg via ORAL
  Filled 2023-01-02: qty 1

## 2023-01-02 MED ORDER — ACETAMINOPHEN 500 MG PO TABS
1000.0000 mg | ORAL_TABLET | Freq: Once | ORAL | Status: AC
  Administered 2023-01-02: 1000 mg via ORAL
  Filled 2023-01-02: qty 2

## 2023-01-02 MED ORDER — HYDROXYZINE HCL 25 MG PO TABS: 25.0000 mg | ORAL_TABLET | Freq: Four times a day (QID) | ORAL | 0 refills | Status: DC

## 2023-01-02 MED ORDER — HYDROXYZINE HCL 10 MG PO TABS
10.0000 mg | ORAL_TABLET | Freq: Once | ORAL | Status: AC
  Administered 2023-01-02: 10 mg via ORAL
  Filled 2023-01-02: qty 1

## 2023-01-02 MED ORDER — ZIPRASIDONE MESYLATE 20 MG IM SOLR
20.0000 mg | Freq: Once | INTRAMUSCULAR | Status: DC
  Filled 2023-01-02: qty 20

## 2023-01-02 MED ORDER — MIDAZOLAM HCL 5 MG/5ML IJ SOLN
5.0000 mg | Freq: Once | INTRAMUSCULAR | Status: DC
  Filled 2023-01-02: qty 5

## 2023-01-02 MED ORDER — IPRATROPIUM-ALBUTEROL 0.5-2.5 (3) MG/3ML IN SOLN
3.0000 mL | RESPIRATORY_TRACT | Status: AC
  Administered 2023-01-02 (×3): 3 mL via RESPIRATORY_TRACT
  Filled 2023-01-02: qty 3
  Filled 2023-01-02: qty 6

## 2023-01-02 MED ORDER — MIDAZOLAM HCL (PF) 10 MG/2ML IJ SOLN
INTRAMUSCULAR | Status: AC
  Administered 2023-01-02: 5 mg via INTRAMUSCULAR
  Filled 2023-01-02: qty 2

## 2023-01-02 MED ORDER — IPRATROPIUM-ALBUTEROL 0.5-2.5 (3) MG/3ML IN SOLN
3.0000 mL | RESPIRATORY_TRACT | Status: AC
  Administered 2023-01-02 (×3): 3 mL via RESPIRATORY_TRACT
  Filled 2023-01-02: qty 6
  Filled 2023-01-02: qty 3

## 2023-01-02 NOTE — ED Triage Notes (Signed)
Pt arrived via [employer] screaming. Pt is both verbally and physically aggressive. Pt IVC d/t standing in street yelling out that she would kill herself. Pt states that she drank one beer and has not used crack today. Pt breath smells of etoh.

## 2023-01-02 NOTE — ED Notes (Signed)
Patient yelling at staff, demanding to leave. Medical equipment removed from patient, IV removed and patient left AMA.

## 2023-01-02 NOTE — ED Notes (Signed)
Unable to complete triage d/t pt refusal to cooperate.

## 2023-01-02 NOTE — ED Triage Notes (Signed)
Pt arrives to ED c/o  SOB x 1 week. Pt was seen earlier this week for same issues. Pt given 1 duo neb\ 125mg  of soul medrol per EMS with improvement of symptoms. Pt endorses ETOH tonight. Pt uses O2 intermittently at home

## 2023-01-02 NOTE — ED Provider Notes (Incomplete)
[place] EMERGENCY DEPARTMENT AT [facility] Provider Note   CSN: [id number] Arrival date & time: 01/02/23  2041     History {Add pertinent medical, surgical, social history, OB history to HPI:1} Chief Complaint  Patient presents with  . Suicidal    [name] is a 57 y.o. female with COPD, T2DM, polysubstance abuse, substance-induced mood disorder, schizoaffective disorder, GERD, tobacco abuse, history of PE, hypothyroidism who p/w SI, IVC'd by police.  Patient arrived with [employer] is screaming both verbally and physically aggressive.  She was IVC'd by her daughter and police. Was found intoxicated w/ ETOH standing in the street screaming that she was going to kill herself. She is uncooperative, belligerent on arrival to ED. Denies illicit drug use, endorses EtOH. Denies SI now.    HPI     Home Medications Prior to Admission medications   Medication Sig Start Date End Date Taking? Authorizing Provider  budesonide-formoterol (SYMBICORT) 160-4.5 MCG/ACT inhaler Inhale 2 puffs into the lungs 2 (two) times daily. 12/01/22   Ghimire, Werner Lean, MD  doxycycline (VIBRAMYCIN) 100 MG capsule Take 1 capsule (100 mg total) by mouth 2 (two) times daily. 12/16/22   Jacalyn Lefevre, MD  hydrOXYzine (ATARAX) 25 MG tablet Take 1 tablet (25 mg total) by mouth every 6 (six) hours. 01/02/23   Mesner, Barbara Cower, MD  Ipratropium-Albuterol (COMBIVENT RESPIMAT) 20-100 MCG/ACT AERS respimat Inhale 1 puff into the lungs every 6 (six) hours as needed for wheezing or shortness of breath. 12/12/22   Sherryll Burger, Pratik D, DO  predniSONE (DELTASONE) 50 MG tablet Take 1 tablet (50 mg total) by mouth daily with breakfast. 12/16/22   Jacalyn Lefevre, MD      Allergies    Iron dextran, Aspirin, and Penicillins    Review of Systems   Review of Systems A 10 point review of systems was performed and is negative unless otherwise reported in HPI.  Physical Exam Updated Vital Signs Resp (!) 22   LMP 01/08/2011   Physical Exam General: Normal appearing {Desc; female/female:11659}, lying in bed.  HEENT: PERRLA, Sclera anicteric, MMM, trachea midline.  Cardiology: RRR, no murmurs/rubs/gallops. BL radial and DP pulses equal bilaterally.  Resp: Normal respiratory rate and effort. CTAB, no wheezes, rhonchi, crackles.  Abd: Soft, non-tender, non-distended. No rebound tenderness or guarding.  GU: Deferred. MSK: No peripheral edema or signs of trauma. Extremities without deformity or TTP. No cyanosis or clubbing. Skin: warm, dry. No rashes or lesions. Back: No CVA tenderness Neuro: A&Ox4, CNs II-XII grossly intact. MAEs. Sensation grossly intact.  Psych: Normal mood and affect.   ED Results / Procedures / Treatments   Labs (all labs ordered are listed, but only abnormal results are displayed) Labs Reviewed  URINALYSIS, ROUTINE W REFLEX MICROSCOPIC - Abnormal; Notable for the following components:      Result Value   Color, Urine STRAW (*)    Specific Gravity, Urine 1.003 (*)    Bacteria, UA RARE (*)    All other components within normal limits  CBC WITH DIFFERENTIAL/PLATELET  COMPREHENSIVE METABOLIC PANEL  ETHANOL  RAPID URINE DRUG SCREEN, HOSP PERFORMED    EKG None  Radiology DG Chest Portable 1 View  Result Date: 01/02/2023 CLINICAL DATA:  57 year old female with shortness of breath. EXAM: PORTABLE CHEST 1 VIEW COMPARISON:  Portable chest 01/01/2023 and earlier. FINDINGS: Portable AP semi upright view at 0408 hours. Lung volumes and mediastinal contours not significantly changed, within normal limits. Visualized tracheal air column is within normal limits. Allowing for portable  technique the lungs are clear. No pneumothorax. No acute osseous abnormality identified. Negative visible bowel gas. IMPRESSION: Negative portable chest. Electronically Signed   By: Odessa Fleming M.D.   On: 01/02/2023 04:56   DG Chest Portable 1 View  Result Date: 01/01/2023 CLINICAL DATA:  Shortness of breath. EXAM:  PORTABLE CHEST 1 VIEW COMPARISON:  CTA chest and portable chest both 12/16/2022 FINDINGS: The lungs are slightly emphysematous but clear. No pleural effusion is seen. There is mild cardiomegaly without evidence of CHF. The mediastinum is normally outlined. Mild aortic atherosclerosis. There is slight thoracic dextroscoliosis and mild thoracic spondylosis. Multiple overlying monitor wires. IMPRESSION: No evidence of acute chest disease.  Emphysema. Mild cardiomegaly. Aortic atherosclerosis. Electronically Signed   By: Almira Bar M.D.   On: 01/01/2023 04:54    Procedures Procedures  {Document cardiac monitor, telemetry assessment procedure when appropriate:1}  Medications Ordered in ED Medications  ziprasidone (GEODON) injection 20 mg (has no administration in time range)  midazolam (VERSED) 5 MG/5ML injection 5 mg (5 mg Intramuscular Not Given 01/02/23 2101)  midazolam PF (VERSED) 10 MG/2ML injection (5 mg Intramuscular Given 01/02/23 2100)    ED Course/ Medical Decision Making/ A&P                          Medical Decision Making Amount and/or Complexity of Data Reviewed Labs: ordered. Decision-making details documented in ED Course.    This patient presents to the ED for concern of ***, this involves an extensive number of treatment options, and is a complaint that carries with it a high risk of complications and morbidity.  I considered the following differential and admission for this acute, potentially life threatening condition.   MDM:    ***  Clinical Course as of 01/02/23 2327  Fri Jan 02, 2023  2141 UA/UDS neg [HN]  2223 Alcohol, Ethyl (B)(!): 266 [HN]  2223 Rapid urine drug screen (hospital performed) [HN]  2223 Hemoglobin(!): 8.4 +anemia, mildly worse from prior [HN]  2223 WBC: 8.8 No leukocytosis  [HN]  2323 Patient with reassuring vitals signs. She is reevaluated, arousable to voice, asking for something to drink. She is still too intoxicated/sedated to walk.  Patient is signed out to the oncoming ED physician Dr. Madilyn Hook who is made aware of her history, presentation, exam, workup, and plan. Plan will be to reevaluate when metabolizes.   [HN]    Clinical Course User Index [HN] Loetta Rough, MD    Labs: I Ordered, and personally interpreted labs.  The pertinent results include:  ***  Imaging Studies ordered: I ordered imaging studies including *** I independently visualized and interpreted imaging. I agree with the radiologist interpretation  Additional history obtained from ***.  External records from outside source obtained and reviewed including ***  Cardiac Monitoring: .The patient was maintained on a cardiac monitor.  I personally viewed and interpreted the cardiac monitored which showed an underlying rhythm of: ***  Reevaluation: After the interventions noted above, I reevaluated the patient and found that they have :{resolved/improved/worsened:23923::"improved"}  Social Determinants of Health: .***  Disposition:  ***  Co morbidities that complicate the patient evaluation . Past Medical History:  Diagnosis Date  . Abdominal pain   . Accidental drug overdose April 2013  . Anxiety   . Atrial fibrillation (HCC) 09/29/11   converted spontaneously  . Chronic back pain   . Chronic knee pain   . Chronic nausea   . Chronic pain   .  COPD (chronic obstructive pulmonary disease) (HCC)   . Depression   . Diabetes mellitus    states her doctor took her off all DM meds in past month  . Diabetic neuropathy (HCC)   . Dyspnea    with exertion   . GERD (gastroesophageal reflux disease)   . Headache(784.0)    migraines   . HTN (hypertension)    not on meds since in a year   . Hyperlipidemia   . Hypothyroidism    not on meds in a while   . Mental disorder    Bipolar and schizophrenic  . Nausea and vomiting 01/02/2023  . Requires supplemental oxygen    as needed per patient   . Schizophrenia (HCC)   . Schizophrenia, acute (HCC)  11/13/2017  . Tobacco abuse      Medicines Meds ordered this encounter  Medications  . ziprasidone (GEODON) injection 20 mg  . midazolam (VERSED) 5 MG/5ML injection 5 mg  . midazolam PF (VERSED) 10 MG/2ML injection    Romero Belling S: cabinet override    I have reviewed the patients home medicines and have made adjustments as needed  Problem List / ED Course: Problem List Items Addressed This Visit   None        {Document critical care time when appropriate:1} {Document review of labs and clinical decision tools ie heart score, Chads2Vasc2 etc:1}  {Document your independent review of radiology images, and any outside records:1} {Document your discussion with family members, caretakers, and with consultants:1} {Document social determinants of health affecting pt's care:1} {Document your decision making why or why not admission, treatments were needed:1}  This note was created using dictation software, which may contain spelling or grammatical errors.

## 2023-01-02 NOTE — ED Triage Notes (Signed)
Pt left AMA from ER less than 30 mins ago, now c/o itching all over.

## 2023-01-02 NOTE — ED Provider Notes (Signed)
[place] EMERGENCY DEPARTMENT AT [facility] Provider Note   CSN: [id number] Arrival date & time: 01/02/23  2041     History {Add pertinent medical, surgical, social history, OB history to HPI:1} Chief Complaint  Patient presents with   Suicidal    [name] is a 57 y.o. female with COPD, T2DM, polysubstance abuse, substance-induced mood disorder, schizoaffective disorder, GERD, tobacco abuse, history of PE, hypothyroidism who p/w SI, IVC'd by police.  Patient ***   HPI     Home Medications Prior to Admission medications   Medication Sig Start Date End Date Taking? Authorizing Provider  budesonide-formoterol (SYMBICORT) 160-4.5 MCG/ACT inhaler Inhale 2 puffs into the lungs 2 (two) times daily. 12/01/22   Ghimire, Werner Lean, MD  doxycycline (VIBRAMYCIN) 100 MG capsule Take 1 capsule (100 mg total) by mouth 2 (two) times daily. 12/16/22   Jacalyn Lefevre, MD  hydrOXYzine (ATARAX) 25 MG tablet Take 1 tablet (25 mg total) by mouth every 6 (six) hours. 01/02/23   Mesner, Barbara Cower, MD  Ipratropium-Albuterol (COMBIVENT RESPIMAT) 20-100 MCG/ACT AERS respimat Inhale 1 puff into the lungs every 6 (six) hours as needed for wheezing or shortness of breath. 12/12/22   Sherryll Burger, Pratik D, DO  predniSONE (DELTASONE) 50 MG tablet Take 1 tablet (50 mg total) by mouth daily with breakfast. 12/16/22   Jacalyn Lefevre, MD      Allergies    Iron dextran, Aspirin, and Penicillins    Review of Systems   Review of Systems A 10 point review of systems was performed and is negative unless otherwise reported in HPI.  Physical Exam Updated Vital Signs Resp (!) 22   LMP 01/08/2011  Physical Exam General: Normal appearing {Desc; female/female:11659}, lying in bed.  HEENT: PERRLA, Sclera anicteric, MMM, trachea midline.  Cardiology: RRR, no murmurs/rubs/gallops. BL radial and DP pulses equal bilaterally.  Resp: Normal respiratory rate and effort. CTAB, no wheezes, rhonchi, crackles.  Abd: Soft,  non-tender, non-distended. No rebound tenderness or guarding.  GU: Deferred. MSK: No peripheral edema or signs of trauma. Extremities without deformity or TTP. No cyanosis or clubbing. Skin: warm, dry. No rashes or lesions. Back: No CVA tenderness Neuro: A&Ox4, CNs II-XII grossly intact. MAEs. Sensation grossly intact.  Psych: Normal mood and affect.   ED Results / Procedures / Treatments   Labs (all labs ordered are listed, but only abnormal results are displayed) Labs Reviewed  URINALYSIS, ROUTINE W REFLEX MICROSCOPIC - Abnormal; Notable for the following components:      Result Value   Color, Urine STRAW (*)    Specific Gravity, Urine 1.003 (*)    Bacteria, UA RARE (*)    All other components within normal limits  CBC WITH DIFFERENTIAL/PLATELET  COMPREHENSIVE METABOLIC PANEL  ETHANOL  RAPID URINE DRUG SCREEN, HOSP PERFORMED    EKG None  Radiology DG Chest Portable 1 View  Result Date: 01/02/2023 CLINICAL DATA:  57 year old female with shortness of breath. EXAM: PORTABLE CHEST 1 VIEW COMPARISON:  Portable chest 01/01/2023 and earlier. FINDINGS: Portable AP semi upright view at 0408 hours. Lung volumes and mediastinal contours not significantly changed, within normal limits. Visualized tracheal air column is within normal limits. Allowing for portable technique the lungs are clear. No pneumothorax. No acute osseous abnormality identified. Negative visible bowel gas. IMPRESSION: Negative portable chest. Electronically Signed   By: Odessa Fleming M.D.   On: 01/02/2023 04:56   DG Chest Portable 1 View  Result Date: 01/01/2023 CLINICAL DATA:  Shortness of breath. EXAM: PORTABLE  CHEST 1 VIEW COMPARISON:  CTA chest and portable chest both 12/16/2022 FINDINGS: The lungs are slightly emphysematous but clear. No pleural effusion is seen. There is mild cardiomegaly without evidence of CHF. The mediastinum is normally outlined. Mild aortic atherosclerosis. There is slight thoracic dextroscoliosis  and mild thoracic spondylosis. Multiple overlying monitor wires. IMPRESSION: No evidence of acute chest disease.  Emphysema. Mild cardiomegaly. Aortic atherosclerosis. Electronically Signed   By: Almira Bar M.D.   On: 01/01/2023 04:54    Procedures Procedures  {Document cardiac monitor, telemetry assessment procedure when appropriate:1}  Medications Ordered in ED Medications  ziprasidone (GEODON) injection 20 mg (has no administration in time range)  midazolam (VERSED) 5 MG/5ML injection 5 mg (5 mg Intramuscular Not Given 01/02/23 2101)  midazolam PF (VERSED) 10 MG/2ML injection (5 mg Intramuscular Given 01/02/23 2100)    ED Course/ Medical Decision Making/ A&P                          Medical Decision Making Amount and/or Complexity of Data Reviewed Labs: ordered.    This patient presents to the ED for concern of ***, this involves an extensive number of treatment options, and is a complaint that carries with it a high risk of complications and morbidity.  I considered the following differential and admission for this acute, potentially life threatening condition.   MDM:    ***     Labs: I Ordered, and personally interpreted labs.  The pertinent results include:  ***  Imaging Studies ordered: I ordered imaging studies including *** I independently visualized and interpreted imaging. I agree with the radiologist interpretation  Additional history obtained from ***.  External records from outside source obtained and reviewed including ***  Cardiac Monitoring: The patient was maintained on a cardiac monitor.  I personally viewed and interpreted the cardiac monitored which showed an underlying rhythm of: ***  Reevaluation: After the interventions noted above, I reevaluated the patient and found that they have :{resolved/improved/worsened:23923::"improved"}  Social Determinants of Health: ***  Disposition:  ***  Co morbidities that complicate the patient  evaluation  Past Medical History:  Diagnosis Date   Abdominal pain    Accidental drug overdose April 2013   Anxiety    Atrial fibrillation (HCC) 09/29/11   converted spontaneously   Chronic back pain    Chronic knee pain    Chronic nausea    Chronic pain    COPD (chronic obstructive pulmonary disease) (HCC)    Depression    Diabetes mellitus    states her doctor took her off all DM meds in past month   Diabetic neuropathy (HCC)    Dyspnea    with exertion    GERD (gastroesophageal reflux disease)    Headache(784.0)    migraines    HTN (hypertension)    not on meds since in a year    Hyperlipidemia    Hypothyroidism    not on meds in a while    Mental disorder    Bipolar and schizophrenic   Nausea and vomiting 01/02/2023   Requires supplemental oxygen    as needed per patient    Schizophrenia (HCC)    Schizophrenia, acute (HCC) 11/13/2017   Tobacco abuse      Medicines Meds ordered this encounter  Medications   ziprasidone (GEODON) injection 20 mg   midazolam (VERSED) 5 MG/5ML injection 5 mg   midazolam PF (VERSED) 10 MG/2ML injection    Romero Belling S:  cabinet override    I have reviewed the patients home medicines and have made adjustments as needed  Problem List / ED Course: Problem List Items Addressed This Visit   None        {Document critical care time when appropriate:1} {Document review of labs and clinical decision tools ie heart score, Chads2Vasc2 etc:1}  {Document your independent review of radiology images, and any outside records:1} {Document your discussion with family members, caretakers, and with consultants:1} {Document social determinants of health affecting pt's care:1} {Document your decision making why or why not admission, treatments were needed:1}  This note was created using dictation software, which may contain spelling or grammatical errors.

## 2023-01-02 NOTE — ED Notes (Signed)
Unable to obtain vs d/t pt refusing and remaining physically aggressive.

## 2023-01-02 NOTE — ED Provider Notes (Signed)
[place] EMERGENCY DEPARTMENT AT [facility] Provider Note   CSN: [id number] Arrival date & time: 01/02/23  0450     History  Chief Complaint  Patient presents with   Pruritis    [name] is a 57 y.o. female.  I was take care of patient earlier tonight when she eloped.  She apparently returned and now her main issue is pruritus.  All over.  No changes in the last 20 minutes.        Home Medications Prior to Admission medications   Medication Sig Start Date End Date Taking? Authorizing Provider  hydrOXYzine (ATARAX) 25 MG tablet Take 1 tablet (25 mg total) by mouth every 6 (six) hours. 01/02/23  Yes Kolyn Rozario, Barbara Cower, MD  acetaminophen (TYLENOL) 500 MG tablet Take 1,000 mg by mouth daily.    [provider]  albuterol (VENTOLIN HFA) 108 (90 Base) MCG/ACT inhaler Inhale 2 puffs into the lungs every 4 (four) hours as needed for wheezing or shortness of breath. 12/24/22   [provider]  budesonide-formoterol (SYMBICORT) 160-4.5 MCG/ACT inhaler Inhale 2 puffs into the lungs 2 (two) times daily. 12/01/22   Ghimire, Werner Lean, MD  doxycycline (VIBRAMYCIN) 100 MG capsule Take 1 capsule (100 mg total) by mouth 2 (two) times daily. 12/16/22   Jacalyn Lefevre, MD  folic acid (FOLVITE) 1 MG tablet Take 1 mg by mouth daily. 12/30/22   [provider]  gabapentin (NEURONTIN) 100 MG capsule Take 100 mg by mouth 3 (three) times daily. 12/30/22   [provider]  Ipratropium-Albuterol (COMBIVENT RESPIMAT) 20-100 MCG/ACT AERS respimat Inhale 1 puff into the lungs every 6 (six) hours as needed for wheezing or shortness of breath. 12/12/22   Sherryll Burger, Pratik D, DO  metFORMIN (GLUCOPHAGE-XR) 750 MG 24 hr tablet Take 750 mg by mouth daily. 12/30/22   [provider]  pantoprazole (PROTONIX) 40 MG tablet Take 40 mg by mouth daily. 12/30/22   [provider]  predniSONE (DELTASONE) 50 MG tablet Take 1 tablet (50 mg total) by mouth daily with breakfast.  12/16/22   Jacalyn Lefevre, MD  senna-docusate (SENOKOT-S) 8.6-50 MG tablet Take 1 tablet by mouth 2 (two) times daily as needed for mild constipation. 01/05/23   Tilden Fossa, MD      Allergies    Iron dextran, Aspirin, and Penicillins    Review of Systems   Review of Systems  Physical Exam Updated Vital Signs BP 107/68   Pulse (!) 125   Temp 98.2 F (36.8 C) (Oral)   Resp 16   Ht 5\' 2"  (1.575 m)   Wt 49.9 kg   LMP 01/08/2011   SpO2 93%   BMI 20.12 kg/m  Physical Exam Vitals and nursing note reviewed.  Constitutional:      Appearance: She is well-developed.  HENT:     Head: Normocephalic and atraumatic.  Eyes:     Pupils: Pupils are equal, round, and reactive to light.  Cardiovascular:     Rate and Rhythm: Normal rate and regular rhythm.  Pulmonary:     Effort: No respiratory distress.     Breath sounds: No stridor.  Abdominal:     General: There is no distension.  Musculoskeletal:     Cervical back: Normal range of motion.  Skin:    General: Skin is warm and dry.     Comments: A few excoriated areas  Neurological:     General: No focal deficit present.     Mental Status: She  is alert.     ED Results / Procedures / Treatments   Labs (all labs ordered are listed, but only abnormal results are displayed) Labs Reviewed - No data to display  EKG None  Radiology CT ABDOMEN PELVIS W CONTRAST  Result Date: 01/05/2023 CLINICAL DATA:  57 year old female with abdominal and low back pain. EXAM: CT ABDOMEN AND PELVIS WITH CONTRAST TECHNIQUE: Multidetector CT imaging of the abdomen and pelvis was performed using the standard protocol following bolus administration of intravenous contrast. RADIATION DOSE REDUCTION: This exam was performed according to the departmental dose-optimization program which includes automated exposure control, adjustment of the mA and/or kV according to patient size and/or use of iterative reconstruction technique. CONTRAST:  75mL OMNIPAQUE  IOHEXOL 350 MG/ML SOLN COMPARISON:  CT Abdomen and Pelvis 08/25/2022. FINDINGS: Lower chest: Improved lung volumes, negative. Hepatobiliary: Negative liver and gallbladder. No bile duct enlargement. Pancreas: Negative. Spleen: Negative. Adrenals/Urinary Tract: Normal adrenal glands. Nonobstructed kidneys with symmetric renal enhancement and contrast excretion. Unremarkable ureters, with contrast enhanced bilateral ureteral jets. Unremarkable urinary bladder. Stomach/Bowel: Highly redundant sigmoid colon which contains gas and stool above the umbilicus in the midline. Similar retained stool throughout the descending colon and to the splenic flexure. Transverse colon is completely decompressed. Right colon is completely decompressed. No obvious large bowel inflammation. Normal appendix on coronal image 72 tracking medial from the cecum. Terminal ileum is decompressed. No dilated small bowel. Chronic Roux-en-Y type gastric bypass. Decompressed bypassed portion of the stomach. Decompressed duodenum. Stable downstream small bowel anastomosis series 3, image 37. And there is a 2nd more gas distended side to side small bowel anastomosis in the left abdomen. But no upstream or downstream dilated small bowel. No free air or free fluid identified. Vascular/Lymphatic: Aortoiliac calcified atherosclerosis. Major arterial structures are patent. Normal caliber abdominal aorta. Portal venous system is patent. No lymphadenopathy identified. Reproductive: Chronically absent uterus. Diminutive or absent ovaries. Other: Mild streak artifact in the lower pelvis related to hip arthroplasty. No free fluid is evident. Musculoskeletal: Right hip arthroplasty is new since the March CT. No acute osseous abnormality identified. Chronic degeneration lumbar facets, anterior inferior right SI joint. IMPRESSION: 1. No acute or inflammatory process identified in the abdomen or pelvis. Chronic Roux-en-Y type gastric bypass, and a second area of  previous small bowel resection/anastomosis in the right abdomen. No evidence of bowel obstruction or inflammation. But there is abundant retained stool in the descending and redundant sigmoid colon. 2. New right hip arthroplasty since March. 3.  Aortic Atherosclerosis (ICD10-I70.0). Electronically Signed   By: Odessa Fleming M.D.   On: 01/05/2023 05:38    Procedures Procedures    Medications Ordered in ED Medications  LORazepam (ATIVAN) tablet 0.5 mg (0.5 mg Oral Given 01/02/23 0503)  ipratropium-albuterol (DUONEB) 0.5-2.5 (3) MG/3ML nebulizer solution 3 mL (3 mLs Nebulization Given 01/02/23 0529)  diphenhydrAMINE (BENADRYL) capsule 25 mg (25 mg Oral Given 01/02/23 0503)  hydrOXYzine (ATARAX) tablet 10 mg (10 mg Oral Given 01/02/23 2595)    ED Course/ Medical Decision Making/ A&P                             Medical Decision Making Risk Prescription drug management.  Will go ahead and give her the breathing treats from earlier and a dose of benadryl. She had labs checked within last 24 hours that were basically normal, no indication or need to recheck. Lungs clear on previous xr, no indication to recheck.  Itching improved. Asking for sandwich prior to discharge. Accomodated.   Final Clinical Impression(s) / ED Diagnoses Final diagnoses:  Pruritus    Rx / DC Orders ED Discharge Orders          Ordered    hydrOXYzine (ATARAX) 25 MG tablet  Every 6 hours        01/02/23 0643              Sotero Brinkmeyer, Barbara Cower, MD 01/07/23 [phone]

## 2023-01-03 ENCOUNTER — Encounter (HOSPITAL_COMMUNITY): Payer: Self-pay | Admitting: Psychiatry

## 2023-01-03 ENCOUNTER — Emergency Department (HOSPITAL_COMMUNITY): Payer: 59

## 2023-01-03 DIAGNOSIS — R0602 Shortness of breath: Secondary | ICD-10-CM | POA: Diagnosis not present

## 2023-01-03 DIAGNOSIS — J9811 Atelectasis: Secondary | ICD-10-CM | POA: Diagnosis not present

## 2023-01-03 DIAGNOSIS — F1020 Alcohol dependence, uncomplicated: Secondary | ICD-10-CM | POA: Diagnosis present

## 2023-01-03 DIAGNOSIS — R45851 Suicidal ideations: Secondary | ICD-10-CM

## 2023-01-03 MED ORDER — ACETAMINOPHEN 325 MG PO TABS
650.0000 mg | ORAL_TABLET | Freq: Four times a day (QID) | ORAL | Status: DC | PRN
  Administered 2023-01-03 – 2023-01-04 (×2): 650 mg via ORAL
  Filled 2023-01-03 (×2): qty 2

## 2023-01-03 MED ORDER — THIAMINE HCL 100 MG/ML IJ SOLN
100.0000 mg | Freq: Every day | INTRAMUSCULAR | Status: DC
  Administered 2023-01-04: 100 mg via INTRAVENOUS
  Filled 2023-01-03: qty 2

## 2023-01-03 MED ORDER — MOMETASONE FURO-FORMOTEROL FUM 200-5 MCG/ACT IN AERO
2.0000 | INHALATION_SPRAY | Freq: Two times a day (BID) | RESPIRATORY_TRACT | Status: DC
  Administered 2023-01-03 – 2023-01-04 (×2): 2 via RESPIRATORY_TRACT
  Filled 2023-01-03: qty 8.8

## 2023-01-03 MED ORDER — BENZONATATE 100 MG PO CAPS
100.0000 mg | ORAL_CAPSULE | Freq: Once | ORAL | Status: AC
  Administered 2023-01-03: 100 mg via ORAL
  Filled 2023-01-03: qty 1

## 2023-01-03 MED ORDER — LORAZEPAM 2 MG/ML IJ SOLN
0.0000 mg | Freq: Four times a day (QID) | INTRAMUSCULAR | Status: DC
  Filled 2023-01-03: qty 1

## 2023-01-03 MED ORDER — LORAZEPAM 1 MG PO TABS: 0.0000 mg | ORAL_TABLET | Freq: Two times a day (BID) | ORAL | Status: DC

## 2023-01-03 MED ORDER — ONDANSETRON HCL 4 MG PO TABS: 4.0000 mg | ORAL_TABLET | Freq: Three times a day (TID) | ORAL | Status: DC | PRN

## 2023-01-03 MED ORDER — LORAZEPAM 2 MG/ML IJ SOLN: 0.0000 mg | Freq: Two times a day (BID) | INTRAMUSCULAR | Status: DC

## 2023-01-03 MED ORDER — IPRATROPIUM-ALBUTEROL 0.5-2.5 (3) MG/3ML IN SOLN
3.0000 mL | Freq: Once | RESPIRATORY_TRACT | Status: AC
  Administered 2023-01-03: 3 mL via RESPIRATORY_TRACT
  Filled 2023-01-03: qty 3

## 2023-01-03 MED ORDER — ALBUTEROL SULFATE HFA 108 (90 BASE) MCG/ACT IN AERS: 2.0000 | INHALATION_SPRAY | RESPIRATORY_TRACT | Status: DC | PRN

## 2023-01-03 MED ORDER — MOMETASONE FURO-FORMOTEROL FUM 200-5 MCG/ACT IN AERO: 2.0000 | INHALATION_SPRAY | Freq: Three times a day (TID) | RESPIRATORY_TRACT | Status: DC | PRN

## 2023-01-03 MED ORDER — ALBUTEROL SULFATE (2.5 MG/3ML) 0.083% IN NEBU
2.5000 mg | INHALATION_SOLUTION | RESPIRATORY_TRACT | Status: DC | PRN
  Administered 2023-01-03: 2.5 mg via RESPIRATORY_TRACT
  Filled 2023-01-03: qty 3

## 2023-01-03 MED ORDER — HYDROXYZINE HCL 25 MG PO TABS
25.0000 mg | ORAL_TABLET | Freq: Four times a day (QID) | ORAL | Status: DC
  Administered 2023-01-03 – 2023-01-04 (×4): 25 mg via ORAL
  Filled 2023-01-03 (×4): qty 1

## 2023-01-03 MED ORDER — THIAMINE MONONITRATE 100 MG PO TABS
100.0000 mg | ORAL_TABLET | Freq: Every day | ORAL | Status: DC
  Administered 2023-01-03: 100 mg via ORAL
  Filled 2023-01-03 (×2): qty 1

## 2023-01-03 MED ORDER — MOMETASONE FURO-FORMOTEROL FUM 200-5 MCG/ACT IN AERO
2.0000 | INHALATION_SPRAY | Freq: Two times a day (BID) | RESPIRATORY_TRACT | Status: DC
  Administered 2023-01-03: 2 via RESPIRATORY_TRACT
  Filled 2023-01-03 (×2): qty 8.8

## 2023-01-03 MED ORDER — IBUPROFEN 800 MG PO TABS
800.0000 mg | ORAL_TABLET | Freq: Once | ORAL | Status: AC
  Administered 2023-01-03: 800 mg via ORAL
  Filled 2023-01-03: qty 1

## 2023-01-03 MED ORDER — NICOTINE 21 MG/24HR TD PT24
21.0000 mg | MEDICATED_PATCH | Freq: Every day | TRANSDERMAL | Status: DC
  Administered 2023-01-03 – 2023-01-04 (×2): 21 mg via TRANSDERMAL
  Filled 2023-01-03 (×2): qty 1

## 2023-01-03 MED ORDER — LORAZEPAM 1 MG PO TABS
0.0000 mg | ORAL_TABLET | Freq: Four times a day (QID) | ORAL | Status: DC
  Administered 2023-01-03: 2 mg via ORAL
  Administered 2023-01-03 (×2): 1 mg via ORAL
  Filled 2023-01-03: qty 2
  Filled 2023-01-03 (×2): qty 1

## 2023-01-03 MED ORDER — ALUM & MAG HYDROXIDE-SIMETH 200-200-20 MG/5ML PO SUSP: 30.0000 mL | Freq: Four times a day (QID) | ORAL | Status: DC | PRN

## 2023-01-03 NOTE — ED Provider Notes (Signed)
Emergency Medicine Observation Re-evaluation Note  [name] is a 57 y.o. female, seen on rounds today.  Pt initially presented to the ED for complaints of Suicidal Currently, the patient is awake and walking.  She is requesting something to drink and also nicotine patch.  Patient states that she is feeling a lot better, feels that she is ready to go.  Physical Exam  BP 133/84 (BP Location: Left Arm)   Pulse (!) 102   Temp 98.2 F (36.8 C) (Oral)   Resp 20   LMP 01/08/2011   SpO2 97%  Physical Exam General: No distress Cardiac: Regular rate, tachycardia Lungs: No respiratory distress Psych: Calm  ED Course / MDM  EKG:   I have reviewed the labs performed to date as well as medications administered while in observation.  Recent changes in the last 24 hours include no new changes, patient has been involuntarily committed for SI, and per psychiatry team, she will be observed further.  Patient continuing to be observed in the ED.  Plan  Current plan is for continued ED observation. Nicotine patch added. Patient explained the plan, she is understanding.     Derwood Kaplan, MD 01/03/23 1140

## 2023-01-03 NOTE — BH Assessment (Signed)
Per RN, Pt is unable to participate in tele-assessment at this time.   Pamalee Leyden, [facility], [facility] Triage Specialist

## 2023-01-03 NOTE — ED Notes (Signed)
Pt went out the door, yelling  and saying she was leaving. She was brought back in her room by [employer].

## 2023-01-03 NOTE — ED Provider Notes (Signed)
Pt care assumed at 2330. Pt here under IVC.  Care assumed pending recheck following versed.   Patient calm, awake and alert on bedside assessment.  She does have occasional wheezing, no respiratory distress.  Treated with albuterol.  Has known COPD.  She has been cleared for psychiatric evaluation and treatment.   Tilden Fossa, MD 01/03/23 [phone]

## 2023-01-03 NOTE — ED Notes (Signed)
IVC completed; original in red folder, 3 copies in purple zone, 1 copy in medical records

## 2023-01-03 NOTE — Consult Note (Addendum)
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 dangerous and reckless behavior during intoxication, relevant and recent serious suicide attempts and ideations, as well as lack of meaningful safety planning that can be conducted with family members to support the patient, the patient presents as a clear imminent risk to herself and continues to meet [facility] IVC criteria. Plan at this time is to continue to observe the patient in the emergency department with overnight observation and to reevaluate in the morning and make further attempts on safety planning with family as well as the patient.   Recommendations  -Recommend upholding IVC at this time -Recommend overnight observation with reevaluation in the morning by psychiatry -Recommend continue CIWA protocols -Recommend continue safety precautions -Patient is not  amenable at this time to restarting home psychiatric medications  Disposition:  Recommending overnight observations and reassessment tomorrow.  Lenox Ponds, NP 01/03/2023 10:48 AM

## 2023-01-04 DIAGNOSIS — R0602 Shortness of breath: Secondary | ICD-10-CM | POA: Diagnosis not present

## 2023-01-04 DIAGNOSIS — R45851 Suicidal ideations: Secondary | ICD-10-CM

## 2023-01-04 DIAGNOSIS — F10129 Alcohol abuse with intoxication, unspecified: Secondary | ICD-10-CM

## 2023-01-04 DIAGNOSIS — F1994 Other psychoactive substance use, unspecified with psychoactive substance-induced mood disorder: Secondary | ICD-10-CM

## 2023-01-04 NOTE — ED Notes (Signed)
Patient was discharged but she refused to get her paperwork and she did not want to wait for nurse to call taxi. She got her belongings and walked out of ED.

## 2023-01-04 NOTE — ED Provider Notes (Signed)
Emergency Medicine Observation Re-evaluation Note  [name] is a 57 y.o. female, seen on rounds today.  Pt initially presented to the ED for complaints of Suicidal Currently, the patient is resting comfortably.  Physical Exam  BP (!) 111/54   Pulse 96   Temp 98.6 F (37 C) (Oral)   Resp 16   LMP 01/08/2011   SpO2 97%  Physical Exam General: no acute distress Cardiac: regular rate Lungs: no resp distress Psych: calm, excited to go home  ED Course / MDM  EKG:   I have reviewed the labs performed to date as well as medications administered while in observation.  Recent changes in the last 24 hours include - no new changes.  Plan  Current plan is for patient to be discharged. IVC rescinded. Family made aware of the plant to d/c. Transport provided.     Derwood Kaplan, MD 01/04/23 1157

## 2023-01-04 NOTE — Progress Notes (Signed)
CSW received consult for patient to assist with transportation. CSW spoke with patient's daughter [name] who is agreeable for patient to be discharged home via cab.  CSW spoke with RN who is agreeable to discharge patient home via cab.  Edwin Dada, MSW, LCSW Transitions of Care  Clinical Social Worker II [phone]

## 2023-01-04 NOTE — Progress Notes (Signed)
[facility] Psych ED Progress Note  01/04/2023 9:20 AM [name]  MRN:  [id number]   Subjective:    [name] is a 57 y.o. AA female with a past psychiatric history of schizoaffective disorder bipolar type, polysubstance abuse (though primarily crack and ETOH), substance induced mood disorder, substance induced psychosis, severe EtOH disorder, schizophrenia, bipolar, and MDD, with pertinent medical history/comorbidities of seizures, COPD with frequent exacerbations, diabetes type 2, tobacco use, closed fracture of left proximal humerus, hypertension, hyperlipidemia, GERD, hypothyroidism, and gastric bypass s/p, and additional pertinent and past psychiatric history of multiple emergency department visits and hospitalizations for suicide attempts by way of intentional overdose within the last several years, most recent at the beginning of this year, as well as multiple hospitalizations for decompensation of the patient's mental health in the form of primary/substance-induced psychosis, who presented this encounter by way of [employer] after being IVC'd by the patient's daughter, after witnessing her mother running and screaming in the middle of traffic in the street stating she was going to kill herself while under the influence of alcohol.  Patient currently remains under IVC and is currently medically cleared per EDP team for psychiatric evaluation.   Patient seen today at [facility] emergency department for face-to-face re-evaluation.  Upon evaluation, patient tells me that she slept good over the night, no appreciable insomnia, no appreciable withdrawal symptomology from EtOH.  Upon evaluation further, patient presents with no tremors, concerns for fluctuation of consciousness, and/or signs or symptoms of delirium.  Patient orientation is intact upon assessment.  Patient is not presenting with any psychotic features.  Patient denies suicidal or homicidal ideations, denies depression, denies concerns for  leaving the safe and secure environment in the hospital.  Discussed with patient extensively that rehabilitation services for substance use are always available whenever she is amenable.  Collateral ([name], [phone])   Call placed for collateral to patient's daughter.  From collateral obtained and conversation with patient's daughter, extensive safety planning was able to be performed, patient's daughter reports that she will check on her mom today when she gets home, as well as have her grandma check on her to, grandma lives across the street.  Discussed with daughter extensively measures to keep the patient safe, daughter verbalizes she feels confident she can check on her mother and help her.  Principal Problem: Suicidal ideation Diagnosis:  Principal Problem:   Suicidal ideation Active Problems:   Substance induced mood disorder (HCC)   Alcohol abuse with intoxication (HCC)   Alcohol use disorder, severe, dependence (HCC)   ED Assessment Time Calculation: Start Time: 0900 Stop Time: 0920 Total Time in Minutes (Assessment Completion): 20   Past Psychiatric History: schizoaffective disorder bipolar type, polysubstance abuse (though primarily crack and ETOH), substance induced mood disorder, substance induced psychosis, severe EtOH disorder, schizophrenia, bipolar, and MDD; Additional pertinent and past psychiatric history of multiple emergency department visits and hospitalizations for suicide attempts by way of intentional overdose within the last several years, most recent at the beginning of this year, as well as multiple hospitalizations for decompensation of the patient's mental health in the form of primary/substance-induced psychosis   [place] Scale:  Flowsheet Row ED from 01/02/2023 in [facility] Emergency Department at [facility] Most recent reading at 01/03/2023  7:45 PM ED from 01/02/2023 in [facility] Emergency Department at [facility] Most recent  reading at 01/02/2023  5:06 AM ED from 01/02/2023 in [facility] Emergency Department at [facility] Most recent  reading at 01/02/2023  3:42 AM  C-SSRS RISK CATEGORY No Risk No Risk No Risk       Past Medical History:  Past Medical History:  Diagnosis Date   Abdominal pain    Accidental drug overdose April 2013   Anxiety    Atrial fibrillation (HCC) 09/29/11   converted spontaneously   Chronic back pain    Chronic knee pain    Chronic nausea    Chronic pain    COPD (chronic obstructive pulmonary disease) (HCC)    Depression    Diabetes mellitus    states her doctor took her off all DM meds in past month   Diabetic neuropathy (HCC)    Dyspnea    with exertion    GERD (gastroesophageal reflux disease)    Headache(784.0)    migraines    HTN (hypertension)    not on meds since in a year    Hyperlipidemia    Hypothyroidism    not on meds in a while    Mental disorder    Bipolar and schizophrenic   Nausea and vomiting 01/02/2023   Requires supplemental oxygen    as needed per patient    Schizophrenia (HCC)    Schizophrenia, acute (HCC) 11/13/2017   Tobacco abuse     Past Surgical History:  Procedure Laterality Date   ABDOMINAL HYSTERECTOMY     BLADDER SUSPENSION  03/04/2011   Procedure: [facility] PROCEDURE;  Surgeon: Jonna Coup MacDiarmid;  Location: [facility] ORS;  Service: Urology;  Laterality: N/A;   BOWEL RESECTION N/A 04/18/2022   Procedure: SMALL BOWEL RESECTION;  Surgeon: Harriette Bouillon, MD;  Location: [facility] OR;  Service: General;  Laterality: N/A;   CYSTOCELE REPAIR  03/04/2011   Procedure: ANTERIOR REPAIR (CYSTOCELE);  Surgeon: Martina Sinner;  Location: [facility]S;  Service: Urology;  Laterality: N/A;   CYSTOSCOPY  03/04/2011   Procedure: CYSTOSCOPY;  Surgeon: Jonna Coup MacDiarmid;  Location: [facility];  Service: Urology;  Laterality: N/A;   ESOPHAGOGASTRODUODENOSCOPY (EGD) WITH PROPOFOL N/A 05/12/2017   Procedure: ESOPHAGOGASTRODUODENOSCOPY (EGD) WITH PROPOFOL;  Surgeon: Ovidio Kin, MD;  Location: [facility];  Service: General;  Laterality: N/A;   GASTRIC ROUX-EN-Y N/A 03/25/2016   Procedure: LAPAROSCOPIC ROUX-EN-Y GASTRIC BYPASS WITH UPPER ENDOSCOPY;  Surgeon: Glenna Fellows, MD;  Location: [facility];  Service: General;  Laterality: N/A;   KNEE SURGERY     LAPAROSCOPIC ASSISTED VAGINAL HYSTERECTOMY  03/04/2011   Procedure: LAPAROSCOPIC ASSISTED VAGINAL HYSTERECTOMY;  Surgeon: Jeani Hawking, MD;  Location: [facility];  Service: Gynecology;  Laterality: N/A;   LAPAROTOMY N/A 04/18/2022   Procedure: EXPLORATORY LAPAROTOMY;  Surgeon: Harriette Bouillon, MD;  Location: [facility] OR;  Service: General;  Laterality: N/A;   LAPAROTOMY N/A 04/24/2022   Procedure: BRING BACK EXPLORATORY LAPAROTOMY;  Surgeon: Violeta Gelinas, MD;  Location: [facility] OR;  Service: General;  Laterality: N/A;   TOTAL HIP ARTHROPLASTY Right 08/27/2022   Procedure: TOTAL HIP ARTHROPLASTY;  Surgeon: Joen Laura, MD;  Location: [facility] OR;  Service: Orthopedics;  Laterality: Right;   Family History:  Family History  Problem Relation Age of Onset   Heart attack Father        84s   Diabetes Mother    Heart disease Mother    Hypertension Mother    Heart attack Sister        30   COPD Other    Breast cancer Neg Hx    Family Psychiatric  History: As reported Social History:  Social History  Substance and Sexual Activity  Alcohol Use Yes   Comment: 40 ounces daily     Social History   Substance and Sexual Activity  Drug Use Yes   Types: Cocaine, Marijuana, "Crack" cocaine   Comment: last use was about a week ago    Social History   Socioeconomic History   Marital status: Legally Separated    Spouse name: Not on file   Number of children: Not on file   Years of education: Not on file   Highest education level: Not on file  Occupational History   Not on file  Tobacco Use   Smoking status: Every Day    Current packs/day: 1.00    Types: Cigarettes   Smokeless tobacco: Never  Vaping Use    Vaping status: Never Used  Substance and Sexual Activity   Alcohol use: Yes    Comment: 40 ounces daily   Drug use: Yes    Types: Cocaine, Marijuana, "Crack" cocaine    Comment: last use was about a week ago   Sexual activity: Yes    Partners: Male  Other Topics Concern   Not on file  Social History Narrative   Not on file   Social Determinants of Health   Financial Resource Strain: Not on file  Food Insecurity: No Food Insecurity (05/06/2022)   Hunger Vital Sign    Worried About Running Out of Food in the Last Year: Never true    Ran Out of Food in the Last Year: Never true  Transportation Needs: No Transportation Needs (05/06/2022)   PRAPARE - Administrator, Civil Service (Medical): No    Lack of Transportation (Non-Medical): No  Physical Activity: Not on file  Stress: Not on file  Social Connections: Moderately Isolated (05/06/2022)   Social Connection and Isolation Panel [NHANES]    Frequency of Communication with Friends and Family: More than three times a week    Frequency of Social Gatherings with Friends and Family: Twice a week    Attends Religious Services: 1 to 4 times per year    Active Member of [employer] or Organizations: No    Attends Banker Meetings: Never    Marital Status: Separated    Sleep: Good  Appetite:  Fair  Current Medications: Current Facility-Administered Medications  Medication Dose Route Frequency Provider Last Rate Last Admin   acetaminophen (TYLENOL) tablet 650 mg  650 mg Oral Q6H PRN Nanavati, Ankit, MD   650 mg at 01/04/23 0158   albuterol (PROVENTIL) (2.5 MG/3ML) 0.083% nebulizer solution 2.5 mg  2.5 mg Inhalation Q4H PRN Tilden Fossa, MD   2.5 mg at 01/03/23 1834   alum & mag hydroxide-simeth (MAALOX/MYLANTA) 200-200-20 MG/5ML suspension 30 mL  30 mL Oral Q6H PRN Tilden Fossa, MD       hydrOXYzine (ATARAX) tablet 25 mg  25 mg Oral Q6H Tilden Fossa, MD   25 mg at 01/04/23 0158   LORazepam (ATIVAN)  injection 0-4 mg  0-4 mg Intravenous Q6H Tilden Fossa, MD       Or   LORazepam (ATIVAN) tablet 0-4 mg  0-4 mg Oral Q6H Tilden Fossa, MD   1 mg at 01/03/23 2025   [START ON 01/05/2023] LORazepam (ATIVAN) injection 0-4 mg  0-4 mg Intravenous Launa Flight, MD       Or   Melene Muller ON 01/05/2023] LORazepam (ATIVAN) tablet 0-4 mg  0-4 mg Oral Q12H Tilden Fossa, MD       mometasone-formoterol [facility]) 200-5 MCG/ACT  inhaler 2 puff  2 puff Inhalation BID Doristine Counter, RPH   2 puff at 01/04/23 0913   nicotine (NICODERM CQ - dosed in mg/24 hours) patch 21 mg  21 mg Transdermal Daily Rhunette Croft, Ankit, MD   21 mg at 01/04/23 0900   ondansetron (ZOFRAN) tablet 4 mg  4 mg Oral Q8H PRN Tilden Fossa, MD       thiamine (VITAMIN B1) tablet 100 mg  100 mg Oral Daily Tilden Fossa, MD   100 mg at 01/03/23 0981   Or   thiamine (VITAMIN B1) injection 100 mg  100 mg Intravenous Daily Tilden Fossa, MD   100 mg at 01/04/23 0911   Current Outpatient Medications  Medication Sig Dispense Refill   acetaminophen (TYLENOL) 500 MG tablet Take 1,000 mg by mouth daily.     albuterol (VENTOLIN HFA) 108 (90 Base) MCG/ACT inhaler Inhale into the lungs.     folic acid (FOLVITE) 1 MG tablet Take 1 mg by mouth daily.     gabapentin (NEURONTIN) 100 MG capsule Take 100 mg by mouth 3 (three) times daily.     Ipratropium-Albuterol (COMBIVENT RESPIMAT) 20-100 MCG/ACT AERS respimat Inhale 1 puff into the lungs every 6 (six) hours as needed for wheezing or shortness of breath. 4 g 1   metFORMIN (GLUCOPHAGE-XR) 750 MG 24 hr tablet Take 750 mg by mouth daily.     pantoprazole (PROTONIX) 40 MG tablet Take 40 mg by mouth daily.     budesonide-formoterol (SYMBICORT) 160-4.5 MCG/ACT inhaler Inhale 2 puffs into the lungs 2 (two) times daily. 10.2 g 2   doxycycline (VIBRAMYCIN) 100 MG capsule Take 1 capsule (100 mg total) by mouth 2 (two) times daily. 14 capsule 0   hydrOXYzine (ATARAX) 25 MG tablet Take 1 tablet (25 mg  total) by mouth every 6 (six) hours. 12 tablet 0   predniSONE (DELTASONE) 50 MG tablet Take 1 tablet (50 mg total) by mouth daily with breakfast. 5 tablet 0    Lab Results:  Results for orders placed or performed during the hospital encounter of 01/02/23 (from the past 48 hour(s))  Rapid urine drug screen (hospital performed)     Status: None   Collection Time: 01/02/23  8:53 PM  Result Value Ref Range   Opiates NONE DETECTED NONE DETECTED   Cocaine NONE DETECTED NONE DETECTED   Benzodiazepines NONE DETECTED NONE DETECTED   Amphetamines NONE DETECTED NONE DETECTED   Tetrahydrocannabinol NONE DETECTED NONE DETECTED   Barbiturates NONE DETECTED NONE DETECTED    Comment: (NOTE) DRUG SCREEN FOR MEDICAL PURPOSES ONLY.  IF CONFIRMATION IS NEEDED FOR ANY PURPOSE, NOTIFY LAB WITHIN 5 DAYS.  LOWEST DETECTABLE LIMITS FOR URINE DRUG SCREEN Drug Class                     Cutoff (ng/mL) Amphetamine and metabolites    1000 Barbiturate and metabolites    200 Benzodiazepine                 200 Opiates and metabolites        300 Cocaine and metabolites        300 THC                            50 Performed at [facility], [address], [place], [place] [address]   Urinalysis, Routine w reflex microscopic -Urine, Clean Catch     Status: Abnormal  Collection Time: 01/02/23  8:54 PM  Result Value Ref Range   Color, Urine STRAW (A) YELLOW   APPearance CLEAR CLEAR   Specific Gravity, Urine 1.003 (L) 1.005 - 1.030   pH 6.0 5.0 - 8.0   Glucose, UA NEGATIVE NEGATIVE mg/dL   Hgb urine dipstick NEGATIVE NEGATIVE   Bilirubin Urine NEGATIVE NEGATIVE   Ketones, ur NEGATIVE NEGATIVE mg/dL   Protein, ur NEGATIVE NEGATIVE mg/dL   Nitrite NEGATIVE NEGATIVE   Leukocytes,Ua NEGATIVE NEGATIVE   RBC / HPF 0-5 0 - 5 RBC/hpf   WBC, UA 0-5 0 - 5 WBC/hpf   Bacteria, UA RARE (A) NONE SEEN   Squamous Epithelial / HPF 0-5 0 - 5 /HPF    Comment: Performed at [facility], [address]  [place], [place] [address]  CBC with Differential     Status: Abnormal   Collection Time: 01/02/23  9:09 PM  Result Value Ref Range   WBC 8.8 4.0 - 10.5 K/uL   RBC 3.48 (L) 3.87 - 5.11 MIL/uL   Hemoglobin 8.4 (L) 12.0 - 15.0 g/dL   HCT 56.2 (L) 13.0 - 86.5 %   MCV 79.0 (L) 80.0 - 100.0 fL   MCH 24.1 (L) 26.0 - 34.0 pg   MCHC 30.5 30.0 - 36.0 g/dL   RDW 78.4 (H) 69.6 - 29.5 %   Platelets 264 150 - 400 K/uL   nRBC 0.0 0.0 - 0.2 %   Neutrophils Relative % 82 %   Neutro Abs 7.2 1.7 - 7.7 K/uL   Lymphocytes Relative 13 %   Lymphs Abs 1.1 0.7 - 4.0 K/uL   Monocytes Relative 4 %   Monocytes Absolute 0.4 0.1 - 1.0 K/uL   Eosinophils Relative 0 %   Eosinophils Absolute 0.0 0.0 - 0.5 K/uL   Basophils Relative 0 %   Basophils Absolute 0.0 0.0 - 0.1 K/uL   nRBC 0 0 /100 WBC   Myelocytes 1 %   Abs Immature Granulocytes 0.10 (H) 0.00 - 0.07 K/uL    Comment: Performed at [facility], [address] [place], [place] [address]  Comprehensive metabolic panel     Status: Abnormal   Collection Time: 01/02/23  9:09 PM  Result Value Ref Range   Sodium 134 (L) 135 - 145 mmol/L   Potassium 3.8 3.5 - 5.1 mmol/L   Chloride 104 98 - 111 mmol/L   CO2 18 (L) 22 - 32 mmol/L   Glucose, Bld 238 (H) 70 - 99 mg/dL    Comment: Glucose reference range applies only to samples taken after fasting for at least 8 hours.   BUN 6 6 - 20 mg/dL   Creatinine, Ser 2.44 (L) 0.44 - 1.00 mg/dL   Calcium 8.9 8.9 - 01.0 mg/dL   Total Protein 7.4 6.5 - 8.1 g/dL   Albumin 3.6 3.5 - 5.0 g/dL   AST 22 15 - 41 U/L   ALT 22 0 - 44 U/L   Alkaline Phosphatase 123 38 - 126 U/L   Total Bilirubin 0.5 0.3 - 1.2 mg/dL   GFR, Estimated >27 >25 mL/min    Comment: (NOTE) Calculated using the CKD-EPI Creatinine Equation (2021)    Anion gap 12 5 - 15    Comment: Performed at [facility], [address], [place], [place] [address]  Ethanol     Status: Abnormal   Collection Time: 01/02/23  9:09 PM  Result Value Ref Range    Alcohol, Ethyl (B)  266 (H) <10 mg/dL    Comment: (NOTE) Lowest detectable limit for serum alcohol is 10 mg/dL.  For medical purposes only. Performed at [facility], [address], [place], [place] [address]     Blood Alcohol level:  Lab Results  Component Value Date   ETH 266 (H) 01/02/2023   ETH 11 (H) 12/16/2022    Physical Findings:  CIWA:  CIWA-Ar Total: 1 COWS:     Musculoskeletal: Strength & Muscle Tone: within normal limits Gait & Station: normal Patient leans: N/A  Psychiatric Specialty Exam:  Presentation  General Appearance:  Appropriate for Environment  Eye Contact: Good  Speech: Clear and Coherent  Speech Volume: Normal  Handedness: Right   Mood and Affect  Mood: Euthymic  Affect: Appropriate   Thought Process  Thought Processes: Coherent; Goal Directed; Linear  Descriptions of Associations:Intact  Orientation:Full (Time, Place and Person)  Thought Content:Logical  History of Schizophrenia/Schizoaffective disorder:Yes  Duration of Psychotic Symptoms:Greater than six months  Hallucinations:Hallucinations: None  Ideas of Reference:None  Suicidal Thoughts:Suicidal Thoughts: No  Homicidal Thoughts:Homicidal Thoughts: No   Sensorium  Memory: Immediate Good; Recent Good; Remote Good  Judgment: Other (comment) (Improving, intact)  Insight: Lacking; Other (comment) (Improving)   Executive Functions  Concentration: Fair  Attention Span: Fair  Recall: Fair; Other (comment) (Somewhat variable)  Fund of Knowledge: Fair  Language: Fair   Psychomotor Activity  Psychomotor Activity: Psychomotor Activity: Normal   Assets  Assets: Communication Skills; Social Support; Vocational/Educational; Talents/Skills; Financial Resources/Insurance; Housing; Intimacy; Leisure Time; Physical Health; Resilience   Sleep  Sleep: Sleep: Good    Physical Exam: Physical Exam Vitals and nursing note reviewed.   Constitutional:      General: She is not in acute distress.    Appearance: Normal appearance. She is not ill-appearing, toxic-appearing or diaphoretic.  Pulmonary:     Effort: Pulmonary effort is normal.  Neurological:     Mental Status: She is alert and oriented to person, place, and time.  Psychiatric:        Attention and Perception: Attention and perception normal. She is attentive. She does not perceive auditory or visual hallucinations.        Mood and Affect: Mood normal.        Speech: Speech normal.        Behavior: Behavior normal. Behavior is not agitated or aggressive. Behavior is cooperative.        Thought Content: Thought content is not paranoid or delusional. Thought content does not include homicidal or suicidal ideation.    Review of Systems  Neurological:  Negative for tremors.  Psychiatric/Behavioral:  Positive for substance abuse. Negative for depression, hallucinations and suicidal ideas. The patient is not nervous/anxious and does not have insomnia.   All other systems reviewed and are negative.  Blood pressure (!) 111/54, pulse 96, temperature 98.6 F (37 C), temperature source Oral, resp. rate 16, last menstrual period 01/08/2011, SpO2 97%. There is no height or weight on file to calculate BMI.   Medical Decision Making:  Patient initially presented this encounter heavily intoxicated with a blood alcohol level 266 after reportedly presenting with reckless and dangerous behavior in the community and expressions of suicidal ideations prompting psychiatric consult and involuntary commitment and observation hold over the night.  Patient today continues to present with no suicidal or homicidal expressions, endorses she does not want help with her mental health, as well as she does not want help with substance abuse she endorses that she has  troubles with, states that she has a desire to go home and not continue taking medications and avoid substance use herself.   Patient at this time is psychiatrically clear for discharge home.  Safety planning conducted with the patient's daughter in collaboration with the patient and this provider.  Recommendations   -Recommend discontinue IVC at this time -Recommend close outpatient follow-up with mental health services at [facility] and substance abuse treatment, if/when the patient is amenable -Recommend restarting home medications which patient has supply of for her mood -TOC consult for transportation home safely and securely  Safety Plan Paysen L Fenning will reach out to daughter and/or grandmother who are supportive of her, call 911 or call mobile crisis, or go to nearest emergency room if condition worsens or if suicidal thoughts become active Patients' will follow up with [facility] for outpatient psychiatric services (therapy/medication management). Will follow-up with substance abuse resources given upon discharge, if/when amenable. The suicide prevention education provided includes the following: Suicide risk factors Suicide prevention and interventions National Suicide Hotline telephone number [facility] assessment telephone number [facility] Emergency Assistance 911 [facility] and/or Residential Mobile Crisis Unit telephone number Request made of family/significant other to:  Daughter [name] Remove weapons (e.g., guns, rifles, knives), all items previously/currently identified as safety concern.   Remove drugs/medications (over the counter, prescriptions, illicit drugs), all items previously/currently identified as a safety concern.   Lenox Ponds, NP 01/04/2023, 9:20 AM

## 2023-01-05 ENCOUNTER — Encounter (HOSPITAL_COMMUNITY): Payer: Self-pay | Admitting: Emergency Medicine

## 2023-01-05 ENCOUNTER — Other Ambulatory Visit: Payer: Self-pay

## 2023-01-05 ENCOUNTER — Emergency Department (HOSPITAL_COMMUNITY): Payer: 59

## 2023-01-05 ENCOUNTER — Emergency Department (HOSPITAL_COMMUNITY)
Admission: EM | Admit: 2023-01-05 | Discharge: 2023-01-05 | Disposition: A | Discharge status: Home / Self Care | Payer: 59 | Source: Home / Self Care | Attending: Emergency Medicine | Admitting: Emergency Medicine

## 2023-01-05 DIAGNOSIS — I700 Atherosclerosis of aorta: Secondary | ICD-10-CM | POA: Diagnosis not present

## 2023-01-05 DIAGNOSIS — R000 Tachycardia, unspecified: Secondary | ICD-10-CM | POA: Insufficient documentation

## 2023-01-05 DIAGNOSIS — R109 Unspecified abdominal pain: Secondary | ICD-10-CM | POA: Diagnosis present

## 2023-01-05 DIAGNOSIS — R1013 Epigastric pain: Secondary | ICD-10-CM

## 2023-01-05 DIAGNOSIS — Z9884 Bariatric surgery status: Secondary | ICD-10-CM | POA: Diagnosis not present

## 2023-01-05 DIAGNOSIS — M546 Pain in thoracic spine: Secondary | ICD-10-CM | POA: Diagnosis not present

## 2023-01-05 DIAGNOSIS — K5900 Constipation, unspecified: Secondary | ICD-10-CM | POA: Insufficient documentation

## 2023-01-05 DIAGNOSIS — Z743 Need for continuous supervision: Secondary | ICD-10-CM | POA: Diagnosis not present

## 2023-01-05 DIAGNOSIS — R1030 Lower abdominal pain, unspecified: Secondary | ICD-10-CM | POA: Diagnosis not present

## 2023-01-05 DIAGNOSIS — J449 Chronic obstructive pulmonary disease, unspecified: Secondary | ICD-10-CM | POA: Diagnosis not present

## 2023-01-05 DIAGNOSIS — R0602 Shortness of breath: Secondary | ICD-10-CM | POA: Diagnosis not present

## 2023-01-05 DIAGNOSIS — Q438 Other specified congenital malformations of intestine: Secondary | ICD-10-CM | POA: Diagnosis not present

## 2023-01-05 LAB — COMPREHENSIVE METABOLIC PANEL
ALT: 24 U/L (ref 0–44)
AST: 24 U/L (ref 15–41)
Albumin: 3.9 g/dL (ref 3.5–5.0)
Alkaline Phosphatase: 120 U/L (ref 38–126)
Anion gap: 14 (ref 5–15)
BUN: 9 mg/dL (ref 6–20)
CO2: 25 mmol/L (ref 22–32)
Calcium: 9.1 mg/dL (ref 8.9–10.3)
Chloride: 100 mmol/L (ref 98–111)
Creatinine, Ser: 0.51 mg/dL (ref 0.44–1.00)
GFR, Estimated: >60 mL/min (ref >60)
Glucose, Bld: 134 mg/dL — ABNORMAL HIGH (ref 70–99)
Potassium: 4 mmol/L (ref 3.5–5.1)
Sodium: 139 mmol/L (ref 135–145)
Total Bilirubin: 0.4 mg/dL (ref 0.3–1.2)
Total Protein: 7.7 g/dL (ref 6.5–8.1)

## 2023-01-05 LAB — URINALYSIS, ROUTINE W REFLEX MICROSCOPIC
Bilirubin Urine: NEGATIVE
Glucose, UA: NEGATIVE mg/dL
Hgb urine dipstick: NEGATIVE
Ketones, ur: NEGATIVE mg/dL
Leukocytes,Ua: NEGATIVE
Nitrite: NEGATIVE
Protein, ur: NEGATIVE mg/dL
Specific Gravity, Urine: 1.017 (ref 1.005–1.030)
pH: 5 (ref 5.0–8.0)

## 2023-01-05 LAB — CBC
HCT: 35.4 % — ABNORMAL LOW (ref 36.0–46.0)
Hemoglobin: 10.8 g/dL — ABNORMAL LOW (ref 12.0–15.0)
MCH: 24.5 pg — ABNORMAL LOW (ref 26.0–34.0)
MCHC: 30.5 g/dL (ref 30.0–36.0)
MCV: 80.5 fL (ref 80.0–100.0)
Platelets: 324 10*3/uL (ref 150–400)
RBC: 4.4 MIL/uL (ref 3.87–5.11)
RDW: 22.7 % — ABNORMAL HIGH (ref 11.5–15.5)
WBC: 7.6 10*3/uL (ref 4.0–10.5)
nRBC: 0 % (ref 0.0–0.2)

## 2023-01-05 LAB — LIPASE, BLOOD: Lipase: 69 U/L — ABNORMAL HIGH (ref 11–51)

## 2023-01-05 MED ORDER — ALUM & MAG HYDROXIDE-SIMETH 200-200-20 MG/5ML PO SUSP
30.0000 mL | Freq: Once | ORAL | Status: AC
  Administered 2023-01-05: 30 mL via ORAL
  Filled 2023-01-05: qty 30

## 2023-01-05 MED ORDER — HYDROCODONE-ACETAMINOPHEN 5-325 MG PO TABS
1.0000 | ORAL_TABLET | Freq: Once | ORAL | Status: AC
  Administered 2023-01-05: 1 via ORAL
  Filled 2023-01-05: qty 1

## 2023-01-05 MED ORDER — SODIUM CHLORIDE 0.9 % IV BOLUS
500.0000 mL | Freq: Once | INTRAVENOUS | Status: AC
  Administered 2023-01-05: 500 mL via INTRAVENOUS

## 2023-01-05 MED ORDER — SENNOSIDES-DOCUSATE SODIUM 8.6-50 MG PO TABS: 1.0000 | ORAL_TABLET | Freq: Two times a day (BID) | ORAL | 0 refills | Status: DC | PRN

## 2023-01-05 MED ORDER — IOHEXOL 350 MG/ML SOLN
75.0000 mL | Freq: Once | INTRAVENOUS | Status: AC | PRN
  Administered 2023-01-05: 75 mL via INTRAVENOUS

## 2023-01-05 MED ORDER — IPRATROPIUM-ALBUTEROL 0.5-2.5 (3) MG/3ML IN SOLN
3.0000 mL | Freq: Once | RESPIRATORY_TRACT | Status: AC
  Administered 2023-01-05: 3 mL via RESPIRATORY_TRACT
  Filled 2023-01-05: qty 3

## 2023-01-05 MED ORDER — ALBUTEROL SULFATE HFA 108 (90 BASE) MCG/ACT IN AERS
2.0000 | INHALATION_SPRAY | Freq: Once | RESPIRATORY_TRACT | Status: AC
  Administered 2023-01-05: 2 via RESPIRATORY_TRACT
  Filled 2023-01-05: qty 6.7

## 2023-01-05 NOTE — ED Notes (Signed)
Patient transported to CT 

## 2023-01-05 NOTE — ED Triage Notes (Signed)
Pt BIB EMS from home with c/o abdominal and lower back pain. Denies urinary symptoms.

## 2023-01-05 NOTE — ED Provider Notes (Signed)
[facility] AT [facility] Provider Note   CSN: [id number] Arrival date & time: 01/05/23  0319     History  Chief Complaint  Patient presents with   Abdominal Pain    [name] is a 57 y.o. female.  The history is provided by the patient and medical records.  Abdominal Pain [name] is a 57 y.o. female who presents to the Emergency Department complaining of back pain.  She presents to the emergency department for evaluation of mid back pain that started just prior to ED arrival.  She describes it as pain across her mid back but more on the left and radiates to the epigastric region.  No associated fevers.  She has chronic shortness of breath and states that this is at her baseline.  No dysuria, nausea, vomiting, diarrhea, chest pain.  No reported injuries.  She does have a history of COPD.     Home Medications Prior to Admission medications   Medication Sig Start Date End Date Taking? Authorizing Provider  senna-docusate (SENOKOT-S) 8.6-50 MG tablet Take 1 tablet by mouth 2 (two) times daily as needed for mild constipation. 01/05/23  Yes Tilden Fossa, MD  acetaminophen (TYLENOL) 500 MG tablet Take 1,000 mg by mouth daily.    [provider]  albuterol (VENTOLIN HFA) 108 (90 Base) MCG/ACT inhaler Inhale 2 puffs into the lungs every 4 (four) hours as needed for wheezing or shortness of breath. 12/24/22   [provider]  budesonide-formoterol (SYMBICORT) 160-4.5 MCG/ACT inhaler Inhale 2 puffs into the lungs 2 (two) times daily. 12/01/22   Ghimire, Werner Lean, MD  doxycycline (VIBRAMYCIN) 100 MG capsule Take 1 capsule (100 mg total) by mouth 2 (two) times daily. 12/16/22   Jacalyn Lefevre, MD  folic acid (FOLVITE) 1 MG tablet Take 1 mg by mouth daily. 12/30/22   [provider]  gabapentin (NEURONTIN) 100 MG capsule Take 100 mg by mouth 3 (three) times daily. 12/30/22   [provider]  hydrOXYzine (ATARAX) 25 MG  tablet Take 1 tablet (25 mg total) by mouth every 6 (six) hours. 01/02/23   Mesner, Barbara Cower, MD  Ipratropium-Albuterol (COMBIVENT RESPIMAT) 20-100 MCG/ACT AERS respimat Inhale 1 puff into the lungs every 6 (six) hours as needed for wheezing or shortness of breath. 12/12/22   Sherryll Burger, Pratik D, DO  metFORMIN (GLUCOPHAGE-XR) 750 MG 24 hr tablet Take 750 mg by mouth daily. 12/30/22   [provider]  pantoprazole (PROTONIX) 40 MG tablet Take 40 mg by mouth daily. 12/30/22   [provider]  predniSONE (DELTASONE) 50 MG tablet Take 1 tablet (50 mg total) by mouth daily with breakfast. 12/16/22   Jacalyn Lefevre, MD      Allergies    Iron dextran, Aspirin, and Penicillins    Review of Systems   Review of Systems  Gastrointestinal:  Positive for abdominal pain.  All other systems reviewed and are negative.   Physical Exam Updated Vital Signs BP (!) 134/102 (BP Location: Right Arm)   Pulse (!) 114   Temp 98.8 F (37.1 C)   Resp 18   Ht 5\' 2"  (1.575 m)   Wt 49.9 kg   LMP 01/08/2011   SpO2 98%   BMI 20.12 kg/m  Physical Exam Vitals and nursing note reviewed.  Constitutional:      Appearance: She is well-developed.  HENT:     Head: Normocephalic and atraumatic.  Cardiovascular:     Rate and Rhythm: Regular rhythm. Tachycardia  present.     Heart sounds: No murmur heard. Pulmonary:     Effort: Pulmonary effort is normal. No respiratory distress.     Comments: Occasional wheezes Abdominal:     Palpations: Abdomen is soft.     Tenderness: There is no guarding or rebound.     Comments: Mild epigastric tenderness to palpation.  No CVA tenderness  Musculoskeletal:        General: No swelling or tenderness.  Skin:    General: Skin is warm and dry.  Neurological:     Mental Status: She is alert and oriented to person, place, and time.     Comments: 5 out of 5 strength in all 4 extremities.  Normal gait.  Psychiatric:        Behavior: Behavior normal.     ED Results /  Procedures / Treatments   Labs (all labs ordered are listed, but only abnormal results are displayed) Labs Reviewed  LIPASE, BLOOD - Abnormal; Notable for the following components:      Result Value   Lipase 69 (*)    All other components within normal limits  COMPREHENSIVE METABOLIC PANEL - Abnormal; Notable for the following components:   Glucose, Bld 134 (*)    All other components within normal limits  CBC - Abnormal; Notable for the following components:   Hemoglobin 10.8 (*)    HCT 35.4 (*)    MCH 24.5 (*)    RDW 22.7 (*)    All other components within normal limits  URINALYSIS, ROUTINE W REFLEX MICROSCOPIC    EKG None  Radiology CT ABDOMEN PELVIS W CONTRAST  Result Date: 01/05/2023 CLINICAL DATA:  57 year old female with abdominal and low back pain. EXAM: CT ABDOMEN AND PELVIS WITH CONTRAST TECHNIQUE: Multidetector CT imaging of the abdomen and pelvis was performed using the standard protocol following bolus administration of intravenous contrast. RADIATION DOSE REDUCTION: This exam was performed according to the departmental dose-optimization program which includes automated exposure control, adjustment of the mA and/or kV according to patient size and/or use of iterative reconstruction technique. CONTRAST:  75mL OMNIPAQUE IOHEXOL 350 MG/ML SOLN COMPARISON:  CT Abdomen and Pelvis 08/25/2022. FINDINGS: Lower chest: Improved lung volumes, negative. Hepatobiliary: Negative liver and gallbladder. No bile duct enlargement. Pancreas: Negative. Spleen: Negative. Adrenals/Urinary Tract: Normal adrenal glands. Nonobstructed kidneys with symmetric renal enhancement and contrast excretion. Unremarkable ureters, with contrast enhanced bilateral ureteral jets. Unremarkable urinary bladder. Stomach/Bowel: Highly redundant sigmoid colon which contains gas and stool above the umbilicus in the midline. Similar retained stool throughout the descending colon and to the splenic flexure. Transverse  colon is completely decompressed. Right colon is completely decompressed. No obvious large bowel inflammation. Normal appendix on coronal image 72 tracking medial from the cecum. Terminal ileum is decompressed. No dilated small bowel. Chronic Roux-en-Y type gastric bypass. Decompressed bypassed portion of the stomach. Decompressed duodenum. Stable downstream small bowel anastomosis series 3, image 37. And there is a 2nd more gas distended side to side small bowel anastomosis in the left abdomen. But no upstream or downstream dilated small bowel. No free air or free fluid identified. Vascular/Lymphatic: Aortoiliac calcified atherosclerosis. Major arterial structures are patent. Normal caliber abdominal aorta. Portal venous system is patent. No lymphadenopathy identified. Reproductive: Chronically absent uterus. Diminutive or absent ovaries. Other: Mild streak artifact in the lower pelvis related to hip arthroplasty. No free fluid is evident. Musculoskeletal: Right hip arthroplasty is new since the March CT. No acute osseous abnormality identified. Chronic degeneration lumbar facets, anterior  inferior right SI joint. IMPRESSION: 1. No acute or inflammatory process identified in the abdomen or pelvis. Chronic Roux-en-Y type gastric bypass, and a second area of previous small bowel resection/anastomosis in the right abdomen. No evidence of bowel obstruction or inflammation. But there is abundant retained stool in the descending and redundant sigmoid colon. 2. New right hip arthroplasty since March. 3.  Aortic Atherosclerosis (ICD10-I70.0). Electronically Signed   By: Odessa Fleming M.D.   On: 01/05/2023 05:38    Procedures Procedures    Medications Ordered in ED Medications  sodium chloride 0.9 % bolus 500 mL (0 mLs Intravenous Stopped 01/05/23 0548)  alum & mag hydroxide-simeth (MAALOX/MYLANTA) 200-200-20 MG/5ML suspension 30 mL (30 mLs Oral Given 01/05/23 0447)  ipratropium-albuterol (DUONEB) 0.5-2.5 (3) MG/3ML  nebulizer solution 3 mL (3 mLs Nebulization Given 01/05/23 0447)  HYDROcodone-acetaminophen (NORCO/VICODIN) 5-325 MG per tablet 1 tablet (1 tablet Oral Given 01/05/23 0517)  iohexol (OMNIPAQUE) 350 MG/ML injection 75 mL (75 mLs Intravenous Contrast Given 01/05/23 0519)  albuterol (VENTOLIN HFA) 108 (90 Base) MCG/ACT inhaler 2 puff (2 puffs Inhalation Given 01/05/23 0602)    ED Course/ Medical Decision Making/ A&P                             Medical Decision Making Amount and/or Complexity of Data Reviewed Labs: ordered. Radiology: ordered.  Risk OTC drugs. Prescription drug management.   Patient with history of of COPD, gastric bypass, bowel obstruction here for evaluation of back pain, abdominal pain.  She has mild tenderness on examination without peritoneal findings.  No respiratory distress, does have occasional wheeze.  Labs significant for mild elevation in her lipase, stable anemia.  CT abdomen pelvis was obtained, which is negative for acute process.  It does demonstrate abundant stool.  No evidence of pancreatitis on imaging.  Suspect the constipation may be contributing to her pain.  Discussed home care for constipation with oral fluid hydration.  Discussed over-the-counter medications that she may take.  Presentation is not consistent with PE, perforated viscus, bowel obstruction.  Discussed outpatient follow-up and return precautions.        Final Clinical Impression(s) / ED Diagnoses Final diagnoses:  Epigastric pain  Constipation, unspecified constipation type    Rx / DC Orders ED Discharge Orders          Ordered    senna-docusate (SENOKOT-S) 8.6-50 MG tablet  2 times daily PRN        01/05/23 0556              Tilden Fossa, MD 01/05/23 [phone]

## 2023-01-09 ENCOUNTER — Telehealth: Payer: Self-pay

## 2023-01-09 NOTE — Telephone Encounter (Signed)
Transition Care Management Unsuccessful Follow-up Telephone Call  Date of discharge and from where:  01/05/2023 [facility]  Attempts:  1st Attempt  Reason for unsuccessful TCM follow-up call:  Unable to reach patient  [facility]  [facility] Population Health Community Resource Care Guide   ??[email or web address]  ?? [phone]   Website: [email or web address]  [email or web address]

## 2023-01-11 ENCOUNTER — Other Ambulatory Visit: Payer: Self-pay

## 2023-01-11 ENCOUNTER — Observation Stay (HOSPITAL_COMMUNITY)
Admission: EM | Admit: 2023-01-11 | Discharge: 2023-01-12 | Disposition: A | Discharge status: Home / Self Care | Payer: 59 | Attending: Internal Medicine | Admitting: Internal Medicine

## 2023-01-11 ENCOUNTER — Emergency Department (HOSPITAL_COMMUNITY): Payer: 59

## 2023-01-11 ENCOUNTER — Encounter (HOSPITAL_COMMUNITY): Payer: Self-pay | Admitting: Radiology

## 2023-01-11 DIAGNOSIS — R945 Abnormal results of liver function studies: Secondary | ICD-10-CM | POA: Diagnosis not present

## 2023-01-11 DIAGNOSIS — J449 Chronic obstructive pulmonary disease, unspecified: Secondary | ICD-10-CM | POA: Insufficient documentation

## 2023-01-11 DIAGNOSIS — F17210 Nicotine dependence, cigarettes, uncomplicated: Secondary | ICD-10-CM | POA: Diagnosis not present

## 2023-01-11 DIAGNOSIS — I4891 Unspecified atrial fibrillation: Secondary | ICD-10-CM | POA: Insufficient documentation

## 2023-01-11 DIAGNOSIS — R0902 Hypoxemia: Secondary | ICD-10-CM | POA: Diagnosis not present

## 2023-01-11 DIAGNOSIS — F10930 Alcohol use, unspecified with withdrawal, uncomplicated: Secondary | ICD-10-CM

## 2023-01-11 DIAGNOSIS — E039 Hypothyroidism, unspecified: Secondary | ICD-10-CM | POA: Insufficient documentation

## 2023-01-11 DIAGNOSIS — Z79899 Other long term (current) drug therapy: Secondary | ICD-10-CM | POA: Insufficient documentation

## 2023-01-11 DIAGNOSIS — Z743 Need for continuous supervision: Secondary | ICD-10-CM | POA: Diagnosis not present

## 2023-01-11 DIAGNOSIS — E119 Type 2 diabetes mellitus without complications: Secondary | ICD-10-CM | POA: Insufficient documentation

## 2023-01-11 DIAGNOSIS — R000 Tachycardia, unspecified: Secondary | ICD-10-CM | POA: Diagnosis not present

## 2023-01-11 DIAGNOSIS — I10 Essential (primary) hypertension: Secondary | ICD-10-CM | POA: Insufficient documentation

## 2023-01-11 DIAGNOSIS — R410 Disorientation, unspecified: Secondary | ICD-10-CM | POA: Diagnosis not present

## 2023-01-11 DIAGNOSIS — R569 Unspecified convulsions: Principal | ICD-10-CM

## 2023-01-11 DIAGNOSIS — Z96641 Presence of right artificial hip joint: Secondary | ICD-10-CM | POA: Insufficient documentation

## 2023-01-11 DIAGNOSIS — F10939 Alcohol use, unspecified with withdrawal, unspecified: Secondary | ICD-10-CM | POA: Diagnosis not present

## 2023-01-11 LAB — URINALYSIS, ROUTINE W REFLEX MICROSCOPIC
Bilirubin Urine: NEGATIVE
Glucose, UA: NEGATIVE mg/dL
Hgb urine dipstick: NEGATIVE
Ketones, ur: NEGATIVE mg/dL
Leukocytes,Ua: NEGATIVE
Nitrite: NEGATIVE
Protein, ur: 30 mg/dL — AB
Specific Gravity, Urine: 1.017 (ref 1.005–1.030)
pH: 5 (ref 5.0–8.0)

## 2023-01-11 LAB — CBC WITH DIFFERENTIAL/PLATELET
Abs Immature Granulocytes: 0.03 10*3/uL (ref 0.00–0.07)
Basophils Absolute: 0.1 10*3/uL (ref 0.0–0.1)
Basophils Relative: 0 %
Eosinophils Absolute: 0 10*3/uL (ref 0.0–0.5)
Eosinophils Relative: 0 %
HCT: 35.7 % — ABNORMAL LOW (ref 36.0–46.0)
Hemoglobin: 10 g/dL — ABNORMAL LOW (ref 12.0–15.0)
Immature Granulocytes: 0 %
Lymphocytes Relative: 28 %
Lymphs Abs: 3.2 10*3/uL (ref 0.7–4.0)
MCH: 23 pg — ABNORMAL LOW (ref 26.0–34.0)
MCHC: 28 g/dL — ABNORMAL LOW (ref 30.0–36.0)
MCV: 82.3 fL (ref 80.0–100.0)
Monocytes Absolute: 0.9 10*3/uL (ref 0.1–1.0)
Monocytes Relative: 8 %
Neutro Abs: 7.1 10*3/uL (ref 1.7–7.7)
Neutrophils Relative %: 64 %
Platelets: 334 10*3/uL (ref 150–400)
RBC: 4.34 MIL/uL (ref 3.87–5.11)
RDW: 20.7 % — ABNORMAL HIGH (ref 11.5–15.5)
WBC: 11.3 10*3/uL — ABNORMAL HIGH (ref 4.0–10.5)
nRBC: 0 % (ref 0.0–0.2)

## 2023-01-11 LAB — COMPREHENSIVE METABOLIC PANEL
ALT: 48 U/L — ABNORMAL HIGH (ref 0–44)
AST: 45 U/L — ABNORMAL HIGH (ref 15–41)
Albumin: 3.9 g/dL (ref 3.5–5.0)
Alkaline Phosphatase: 153 U/L — ABNORMAL HIGH (ref 38–126)
Anion gap: 16 — ABNORMAL HIGH (ref 5–15)
BUN: 9 mg/dL (ref 6–20)
CO2: 15 mmol/L — ABNORMAL LOW (ref 22–32)
Calcium: 8.5 mg/dL — ABNORMAL LOW (ref 8.9–10.3)
Chloride: 102 mmol/L (ref 98–111)
Creatinine, Ser: 0.42 mg/dL — ABNORMAL LOW (ref 0.44–1.00)
GFR, Estimated: >60 mL/min (ref >60)
Glucose, Bld: 195 mg/dL — ABNORMAL HIGH (ref 70–99)
Potassium: 4.1 mmol/L (ref 3.5–5.1)
Sodium: 133 mmol/L — ABNORMAL LOW (ref 135–145)
Total Bilirubin: 0.8 mg/dL (ref 0.3–1.2)
Total Protein: 7.5 g/dL (ref 6.5–8.1)

## 2023-01-11 LAB — HEMOGLOBIN A1C
Hgb A1c MFr Bld: 7.3 % — ABNORMAL HIGH (ref 4.8–5.6)
Mean Plasma Glucose: 162.81 mg/dL

## 2023-01-11 LAB — RAPID URINE DRUG SCREEN, HOSP PERFORMED
Amphetamines: NOT DETECTED
Barbiturates: NOT DETECTED
Benzodiazepines: POSITIVE — AB
Cocaine: POSITIVE — AB
Opiates: NOT DETECTED
Tetrahydrocannabinol: NOT DETECTED

## 2023-01-11 LAB — GLUCOSE, CAPILLARY
Glucose-Capillary: 126 mg/dL — ABNORMAL HIGH (ref 70–99)
Glucose-Capillary: 128 mg/dL — ABNORMAL HIGH (ref 70–99)

## 2023-01-11 LAB — ETHANOL: Alcohol, Ethyl (B): 80 mg/dL — ABNORMAL HIGH (ref <10)

## 2023-01-11 LAB — CBG MONITORING, ED: Glucose-Capillary: 211 mg/dL — ABNORMAL HIGH (ref 70–99)

## 2023-01-11 MED ORDER — ACETAMINOPHEN 650 MG RE SUPP: 650.0000 mg | Freq: Four times a day (QID) | RECTAL | Status: DC | PRN

## 2023-01-11 MED ORDER — ENOXAPARIN SODIUM 40 MG/0.4ML IJ SOSY
40.0000 mg | PREFILLED_SYRINGE | Freq: Every day | INTRAMUSCULAR | Status: DC
  Administered 2023-01-11: 40 mg via SUBCUTANEOUS
  Filled 2023-01-11: qty 0.4

## 2023-01-11 MED ORDER — FOLIC ACID 1 MG PO TABS
1.0000 mg | ORAL_TABLET | Freq: Every day | ORAL | Status: DC
  Administered 2023-01-11: 1 mg via ORAL
  Filled 2023-01-11: qty 1

## 2023-01-11 MED ORDER — CHLORDIAZEPOXIDE HCL 25 MG PO CAPS: 25.0000 mg | ORAL_CAPSULE | ORAL | Status: DC

## 2023-01-11 MED ORDER — LORAZEPAM 1 MG PO TABS: 1.0000 mg | ORAL_TABLET | ORAL | Status: DC | PRN

## 2023-01-11 MED ORDER — THIAMINE MONONITRATE 100 MG PO TABS
100.0000 mg | ORAL_TABLET | Freq: Every day | ORAL | Status: DC
  Administered 2023-01-11: 100 mg via ORAL
  Filled 2023-01-11: qty 1

## 2023-01-11 MED ORDER — LORAZEPAM 2 MG/ML IJ SOLN
INTRAMUSCULAR | Status: AC
  Administered 2023-01-11: 1 mg
  Filled 2023-01-11: qty 1

## 2023-01-11 MED ORDER — ONDANSETRON HCL 4 MG PO TABS: 4.0000 mg | ORAL_TABLET | Freq: Four times a day (QID) | ORAL | Status: DC | PRN

## 2023-01-11 MED ORDER — PANTOPRAZOLE SODIUM 40 MG PO TBEC
40.0000 mg | DELAYED_RELEASE_TABLET | Freq: Every day | ORAL | Status: DC
  Administered 2023-01-11: 40 mg via ORAL
  Filled 2023-01-11: qty 1

## 2023-01-11 MED ORDER — CHLORDIAZEPOXIDE HCL 25 MG PO CAPS: 25.0000 mg | ORAL_CAPSULE | Freq: Three times a day (TID) | ORAL | Status: DC

## 2023-01-11 MED ORDER — SENNOSIDES-DOCUSATE SODIUM 8.6-50 MG PO TABS: 1.0000 | ORAL_TABLET | Freq: Every evening | ORAL | Status: DC | PRN

## 2023-01-11 MED ORDER — INSULIN ASPART 100 UNIT/ML IJ SOLN
0.0000 [IU] | Freq: Every day | INTRAMUSCULAR | Status: DC
  Filled 2023-01-11: qty 0.05

## 2023-01-11 MED ORDER — LORAZEPAM 2 MG/ML IJ SOLN
1.0000 mg | INTRAMUSCULAR | Status: DC | PRN
  Administered 2023-01-11: 2 mg via INTRAVENOUS
  Administered 2023-01-11: 1 mg via INTRAVENOUS
  Administered 2023-01-12 (×2): 2 mg via INTRAVENOUS
  Filled 2023-01-11 (×5): qty 1

## 2023-01-11 MED ORDER — INSULIN ASPART 100 UNIT/ML IJ SOLN
0.0000 [IU] | Freq: Three times a day (TID) | INTRAMUSCULAR | Status: DC
  Administered 2023-01-11: 2 [IU] via SUBCUTANEOUS
  Filled 2023-01-11: qty 0.15

## 2023-01-11 MED ORDER — THIAMINE HCL 100 MG/ML IJ SOLN: 100.0000 mg | Freq: Every day | INTRAMUSCULAR | Status: DC

## 2023-01-11 MED ORDER — CHLORDIAZEPOXIDE HCL 25 MG PO CAPS: 25.0000 mg | ORAL_CAPSULE | Freq: Every day | ORAL | Status: DC

## 2023-01-11 MED ORDER — TRAZODONE HCL 50 MG PO TABS
25.0000 mg | ORAL_TABLET | Freq: Every evening | ORAL | Status: DC | PRN
  Administered 2023-01-11: 25 mg via ORAL
  Filled 2023-01-11: qty 1

## 2023-01-11 MED ORDER — METOPROLOL TARTRATE 5 MG/5ML IV SOLN: 5.0000 mg | Freq: Four times a day (QID) | INTRAVENOUS | Status: DC | PRN

## 2023-01-11 MED ORDER — LEVETIRACETAM IN NACL 1000 MG/100ML IV SOLN
20.0000 mg/kg | Freq: Once | INTRAVENOUS | Status: AC
  Administered 2023-01-11: 1000 mg via INTRAVENOUS
  Filled 2023-01-11: qty 100

## 2023-01-11 MED ORDER — CHLORDIAZEPOXIDE HCL 25 MG PO CAPS
25.0000 mg | ORAL_CAPSULE | Freq: Four times a day (QID) | ORAL | Status: DC
  Administered 2023-01-11 (×3): 25 mg via ORAL
  Filled 2023-01-11 (×3): qty 1

## 2023-01-11 MED ORDER — ACETAMINOPHEN 325 MG PO TABS
650.0000 mg | ORAL_TABLET | Freq: Four times a day (QID) | ORAL | Status: DC | PRN
  Administered 2023-01-11: 650 mg via ORAL
  Filled 2023-01-11: qty 2

## 2023-01-11 MED ORDER — ONDANSETRON HCL 4 MG/2ML IJ SOLN: 4.0000 mg | Freq: Four times a day (QID) | INTRAMUSCULAR | Status: DC | PRN

## 2023-01-11 MED ORDER — LORAZEPAM 1 MG PO TABS
1.0000 mg | ORAL_TABLET | ORAL | Status: DC | PRN
  Administered 2023-01-11: 1 mg via ORAL
  Filled 2023-01-11: qty 1

## 2023-01-11 MED ORDER — ZONISAMIDE 100 MG PO CAPS
100.0000 mg | ORAL_CAPSULE | Freq: Every day | ORAL | Status: DC
  Administered 2023-01-11: 100 mg via ORAL
  Filled 2023-01-11 (×2): qty 1

## 2023-01-11 MED ORDER — ALBUTEROL SULFATE (2.5 MG/3ML) 0.083% IN NEBU: 2.5000 mg | INHALATION_SOLUTION | RESPIRATORY_TRACT | Status: DC | PRN

## 2023-01-11 MED ORDER — NICOTINE 7 MG/24HR TD PT24
7.0000 mg | MEDICATED_PATCH | TRANSDERMAL | Status: DC
  Administered 2023-01-11: 7 mg via TRANSDERMAL
  Filled 2023-01-11: qty 1

## 2023-01-11 NOTE — ED Notes (Signed)
EMS escorted pt to the restroom on arrival and did not obtain an urine specimen

## 2023-01-11 NOTE — ED Triage Notes (Signed)
Pt BIBA from home. Pt had witnessed seizure this AM, was in bed at the time. Pt is chronic ETOH user, and drank heavily last night and drank this AM. Pt mildly post-ictal when EMS arrived, pt Aox4 upon arrival to ED.  BP: 138/82 HR: 101 SPO2: 97 RA CBG: 207

## 2023-01-11 NOTE — ED Notes (Signed)
ED TO INPATIENT HANDOFF REPORT  ED Nurse Name and Phone #: [phone]  S Name/Age/Gender [name] 57 y.o. female Room/Bed: WA19/WA19  Code Status   Code Status: Full Code  Home/SNF/Other Patient oriented to: self, place, time, and situation Is this baseline? Yes   Triage Complete: Triage complete  Chief Complaint Seizure [facility]) [R56.9]  Triage Note Pt BIBA from home. Pt had witnessed seizure this AM, was in bed at the time. Pt is chronic ETOH user, and drank heavily last night and drank this AM. Pt mildly post-ictal when EMS arrived, pt Aox4 upon arrival to ED.  BP: 138/82 HR: 101 SPO2: 97 RA CBG: 207   Allergies Allergies  Allergen Reactions   Iron Dextran Shortness Of Breath and Anxiety   Aspirin Nausea And Vomiting and Other (See Comments)    Ok to take tylenol or ibuprofen    Penicillins Other (See Comments)    Unknown. Childhood reaction     Level of Care/Admitting Diagnosis ED Disposition     ED Disposition  Admit   Condition  --   Comment  Hospital Area: [facility] [[id number]]  Level of Care: Progressive [102]  Admit to Progressive based on following criteria: NEUROLOGICAL AND NEUROSURGICAL complex patients with significant risk of instability, who do not meet ICU criteria, yet require close observation or frequent assessment (< / = every 2 - 4 hours) with medical / nursing intervention.  May admit patient to [facility] or [facility] if equivalent level of care is available:: Yes  Covid Evaluation: Asymptomatic - no recent exposure (last 10 days) testing not required  Diagnosis: Seizure [facility]) [[id number]]  Admitting Physician: Maryln Gottron [[id number]]  Attending Physician: Kirby Crigler, MIR Jaxson.Roy [[id number]]  Certification:: I certify this patient will need inpatient services for at least 2 midnights  Estimated Length of Stay: 3          B Medical/Surgery History Past Medical History:  Diagnosis Date   Abdominal pain     Accidental drug overdose April 2013   Anxiety    Atrial fibrillation (HCC) 09/29/11   converted spontaneously   Chronic back pain    Chronic knee pain    Chronic nausea    Chronic pain    COPD (chronic obstructive pulmonary disease) (HCC)    Depression    Diabetes mellitus    states her doctor took her off all DM meds in past month   Diabetic neuropathy (HCC)    Dyspnea    with exertion    GERD (gastroesophageal reflux disease)    Headache(784.0)    migraines    HTN (hypertension)    not on meds since in a year    Hyperlipidemia    Hypothyroidism    not on meds in a while    Mental disorder    Bipolar and schizophrenic   Nausea and vomiting 01/02/2023   Requires supplemental oxygen    as needed per patient    Schizophrenia (HCC)    Schizophrenia, acute (HCC) 11/13/2017   Tobacco abuse    Past Surgical History:  Procedure Laterality Date   ABDOMINAL HYSTERECTOMY     BLADDER SUSPENSION  03/04/2011   Procedure: [facility] PROCEDURE;  Surgeon: Jonna Coup MacDiarmid;  Location: [facility];  Service: Urology;  Laterality: N/A;   BOWEL RESECTION N/A 04/18/2022   Procedure: SMALL BOWEL RESECTION;  Surgeon: Harriette Bouillon, MD;  Location: [facility] OR;  Service: General;  Laterality: N/A;   CYSTOCELE REPAIR  03/04/2011  Procedure: ANTERIOR REPAIR (CYSTOCELE);  Surgeon: Martina Sinner;  Location: [facility];  Service: Urology;  Laterality: N/A;   CYSTOSCOPY  03/04/2011   Procedure: CYSTOSCOPY;  Surgeon: Jonna Coup MacDiarmid;  Location: [facility];  Service: Urology;  Laterality: N/A;   ESOPHAGOGASTRODUODENOSCOPY (EGD) WITH PROPOFOL N/A 05/12/2017   Procedure: ESOPHAGOGASTRODUODENOSCOPY (EGD) WITH PROPOFOL;  Surgeon: Ovidio Kin, MD;  Location: [facility];  Service: General;  Laterality: N/A;   GASTRIC ROUX-EN-Y N/A 03/25/2016   Procedure: LAPAROSCOPIC ROUX-EN-Y GASTRIC BYPASS WITH UPPER ENDOSCOPY;  Surgeon: Glenna Fellows, MD;  Location: [facility];  Service: General;  Laterality: N/A;   KNEE SURGERY      LAPAROSCOPIC ASSISTED VAGINAL HYSTERECTOMY  03/04/2011   Procedure: LAPAROSCOPIC ASSISTED VAGINAL HYSTERECTOMY;  Surgeon: Jeani Hawking, MD;  Location: [facility];  Service: Gynecology;  Laterality: N/A;   LAPAROTOMY N/A 04/18/2022   Procedure: EXPLORATORY LAPAROTOMY;  Surgeon: Harriette Bouillon, MD;  Location: [facility] OR;  Service: General;  Laterality: N/A;   LAPAROTOMY N/A 04/24/2022   Procedure: BRING BACK EXPLORATORY LAPAROTOMY;  Surgeon: Violeta Gelinas, MD;  Location: [facility] OR;  Service: General;  Laterality: N/A;   TOTAL HIP ARTHROPLASTY Right 08/27/2022   Procedure: TOTAL HIP ARTHROPLASTY;  Surgeon: Joen Laura, MD;  Location: [facility];  Service: Orthopedics;  Laterality: Right;     A IV Location/Drains/Wounds Patient Lines/Drains/Airways Status     Active Line/Drains/Airways     Name Placement date Placement time Site Days   Peripheral IV 01/11/23 22 G Anterior;Left;Proximal Forearm 01/11/23  1205  Forearm  less than 1   Wound / Incision (Open or Dehisced) 12/11/22  Thigh Anterior;Left bruise on left hip, 12/11/22  0400  Thigh  31   Wound / Incision (Open or Dehisced) 12/11/22  Abdomen Lower;Medial 12/11/22  0400  Abdomen  31   Wound / Incision (Open or Dehisced) 12/11/22  Vertebral column Upper 12/11/22  0400  Vertebral column  31            Intake/Output Last 24 hours No intake or output data in the 24 hours ending 01/11/23 1235  Labs/Imaging Results for orders placed or performed during the hospital encounter of 01/11/23 (from the past 48 hour(s))  CBG monitoring, ED     Status: Abnormal   Collection Time: 01/11/23  9:00 AM  Result Value Ref Range   Glucose-Capillary 211 (H) 70 - 99 mg/dL    Comment: Glucose reference range applies only to samples taken after fasting for at least 8 hours.  Comprehensive metabolic panel     Status: Abnormal   Collection Time: 01/11/23  9:00 AM  Result Value Ref Range   Sodium 133 (L) 135 - 145 mmol/L   Potassium 4.1 3.5 - 5.1 mmol/L    Chloride 102 98 - 111 mmol/L   CO2 15 (L) 22 - 32 mmol/L   Glucose, Bld 195 (H) 70 - 99 mg/dL    Comment: Glucose reference range applies only to samples taken after fasting for at least 8 hours.   BUN 9 6 - 20 mg/dL   Creatinine, Ser 1.61 (L) 0.44 - 1.00 mg/dL   Calcium 8.5 (L) 8.9 - 10.3 mg/dL   Total Protein 7.5 6.5 - 8.1 g/dL   Albumin 3.9 3.5 - 5.0 g/dL   AST 45 (H) 15 - 41 U/L   ALT 48 (H) 0 - 44 U/L   Alkaline Phosphatase 153 (H) 38 - 126 U/L   Total Bilirubin 0.8 0.3 - 1.2 mg/dL   GFR, Estimated >  60 >60 mL/min    Comment: (NOTE) Calculated using the CKD-EPI Creatinine Equation (2021)    Anion gap 16 (H) 5 - 15    Comment: Performed at [facility], [address] [place], [place] [address]  Ethanol     Status: Abnormal   Collection Time: 01/11/23  9:00 AM  Result Value Ref Range   Alcohol, Ethyl (B) 80 (H) <10 mg/dL    Comment: (NOTE) Lowest detectable limit for serum alcohol is 10 mg/dL.  For medical purposes only. Performed at [facility], [address], [place], [place] [address]   CBC with Differential     Status: Abnormal   Collection Time: 01/11/23  9:00 AM  Result Value Ref Range   WBC 11.3 (H) 4.0 - 10.5 K/uL   RBC 4.34 3.87 - 5.11 MIL/uL   Hemoglobin 10.0 (L) 12.0 - 15.0 g/dL   HCT 09.8 (L) 11.9 - 14.7 %   MCV 82.3 80.0 - 100.0 fL   MCH 23.0 (L) 26.0 - 34.0 pg   MCHC 28.0 (L) 30.0 - 36.0 g/dL   RDW 82.9 (H) 56.2 - 13.0 %   Platelets 334 150 - 400 K/uL   nRBC 0.0 0.0 - 0.2 %   Neutrophils Relative % 64 %   Neutro Abs 7.1 1.7 - 7.7 K/uL   Lymphocytes Relative 28 %   Lymphs Abs 3.2 0.7 - 4.0 K/uL   Monocytes Relative 8 %   Monocytes Absolute 0.9 0.1 - 1.0 K/uL   Eosinophils Relative 0 %   Eosinophils Absolute 0.0 0.0 - 0.5 K/uL   Basophils Relative 0 %   Basophils Absolute 0.1 0.0 - 0.1 K/uL   Immature Granulocytes 0 %   Abs Immature Granulocytes 0.03 0.00 - 0.07 K/uL    Comment: Performed at [facility], [address] [place], [place] [address]   DG Chest Port 1 View  Result Date: 01/11/2023 CLINICAL DATA:  Seizure.  Hypoxia. EXAM: PORTABLE CHEST 1 VIEW COMPARISON:  01/03/2023 FINDINGS: The lungs are clear without focal pneumonia, edema, pneumothorax or pleural effusion. The cardiopericardial silhouette is within normal limits for size. No acute bony abnormality. Telemetry leads overlie the chest. IMPRESSION: No active disease. Electronically Signed   By: Kennith Center M.D.   On: 01/11/2023 09:57   CT Head Wo Contrast  Result Date: 01/11/2023 CLINICAL DATA:  Seizure, new onset with no history of trauma. EXAM: CT HEAD WITHOUT CONTRAST TECHNIQUE: Contiguous axial images were obtained from the base of the skull through the vertex without intravenous contrast. RADIATION DOSE REDUCTION: This exam was performed according to the departmental dose-optimization program which includes automated exposure control, adjustment of the mA and/or kV according to patient size and/or use of iterative reconstruction technique. COMPARISON:  09/14/2022 FINDINGS: Brain: No evidence of acute infarction, hemorrhage, hydrocephalus, extra-axial collection or mass lesion/mass effect. Vascular: No hyperdense vessel or unexpected calcification. Skull: Normal. Negative for fracture or focal lesion. Sinuses/Orbits: No acute finding. IMPRESSION: No acute or interval finding. Electronically Signed   By: Tiburcio Pea M.D.   On: 01/11/2023 09:50    Pending Labs Unresulted Labs (From admission, onward)     Start     Ordered   01/18/23 0500  Creatinine, serum  (enoxaparin (LOVENOX)    CrCl >/= 30 ml/min)  Weekly,   R     Comments: while on enoxaparin therapy    01/11/23 1217   01/12/23 0500  Comprehensive metabolic panel  Tomorrow morning,  R        01/11/23 1217   01/12/23 0500  CBC  Tomorrow morning,   R        01/11/23 1217   01/11/23 1217  Hemoglobin A1c  (Glycemic Control (SSI)  Q 4 Hours / Glycemic  Control (SSI)  AC +/- HS)  Once,   R       Comments: To assess prior glycemic control    01/11/23 1217   01/11/23 1216  CBC  (enoxaparin (LOVENOX)    CrCl >/= 30 ml/min)  Once,   R       Comments: Baseline for enoxaparin therapy IF NOT ALREADY DRAWN.  Notify MD if PLT < 100 K.    01/11/23 1217   01/11/23 1216  Creatinine, serum  (enoxaparin (LOVENOX)    CrCl >/= 30 ml/min)  Once,   R       Comments: Baseline for enoxaparin therapy IF NOT ALREADY DRAWN.    01/11/23 1217   01/11/23 1013  Urine rapid drug screen (hosp performed)  Once,   STAT        01/11/23 1012   01/11/23 0904  Urinalysis, Routine w reflex microscopic -Urine, Clean Catch  Once,   URGENT       Question:  Specimen Source  Answer:  Urine, Clean Catch   01/11/23 0903            Vitals/Pain Today's Vitals   01/11/23 0850 01/11/23 0900 01/11/23 1040 01/11/23 1100  BP:  (!) 147/76 (!) 124/107 (!) 152/79  Pulse:  (!) 107 (!) 108 (!) 103  Resp:  (!) 21 18 18   Temp:      TempSrc:      SpO2:  96% 99% 100%  Weight: 50 kg     Height: 5\' 2"  (1.575 m)     PainSc: 6        Isolation Precautions No active isolations  Medications Medications  LORazepam (ATIVAN) tablet 1-4 mg (1 mg Oral Given 01/11/23 1052)    Or  LORazepam (ATIVAN) injection 1-4 mg ( Intravenous See Alternative 01/11/23 1052)  thiamine (VITAMIN B1) tablet 100 mg (100 mg Oral Given 01/11/23 1052)    Or  thiamine (VITAMIN B1) injection 100 mg ( Intravenous See Alternative 01/11/23 1052)  pantoprazole (PROTONIX) EC tablet 40 mg (has no administration in time range)  enoxaparin (LOVENOX) injection 40 mg (has no administration in time range)  insulin aspart (novoLOG) injection 0-15 Units (has no administration in time range)  insulin aspart (novoLOG) injection 0-5 Units (has no administration in time range)  acetaminophen (TYLENOL) tablet 650 mg (has no administration in time range)    Or  acetaminophen (TYLENOL) suppository 650 mg (has no administration  in time range)  traZODone (DESYREL) tablet 25 mg (has no administration in time range)  senna-docusate (Senokot-S) tablet 1 tablet (has no administration in time range)  ondansetron (ZOFRAN) tablet 4 mg (has no administration in time range)    Or  ondansetron (ZOFRAN) injection 4 mg (has no administration in time range)  albuterol (PROVENTIL) (2.5 MG/3ML) 0.083% nebulizer solution 2.5 mg (has no administration in time range)  metoprolol tartrate (LOPRESSOR) injection 5 mg (has no administration in time range)  chlordiazePOXIDE (LIBRIUM) capsule 25 mg (has no administration in time range)    Followed by  chlordiazePOXIDE (LIBRIUM) capsule 25 mg (has no administration in time range)    Followed by  chlordiazePOXIDE (LIBRIUM) capsule 25 mg (has no administration in time range)  Followed by  chlordiazePOXIDE (LIBRIUM) capsule 25 mg (has no administration in time range)  LORazepam (ATIVAN) tablet 1-4 mg (has no administration in time range)  folic acid (FOLVITE) tablet 1 mg (has no administration in time range)  LORazepam (ATIVAN) 2 MG/ML injection (1 mg  Given 01/11/23 0906)  levETIRAcetam (KEPPRA) IVPB 1000 mg/100 mL premix (1,000 mg Intravenous New Bag/Given 01/11/23 1204)    Mobility walks     Focused Assessments  R Recommendations: See Admitting Provider Note  Report given to:   Additional Notes:

## 2023-01-11 NOTE — ED Provider Notes (Signed)
[place] EMERGENCY DEPARTMENT AT [facility] Provider Note   CSN: [id number] Arrival date & time: 01/11/23  0840     History  Chief Complaint  [name] presents with  . Seizures  . Alcohol Problem    [name] is a 57 y.o. female.  [name] is a 57 year old female with a past medical history of alcohol use disorder, COPD, schizoaffective presenting to [name] emergency department with a seizure.  Per EMS, [name] was at home sitting on [name] couch when [name] had a witnessed seizure by family who called 911.  [name] was mildly postictal when EMS arrived and was back to her neurologic baseline initially on arrival.  [name] did have a witnessed tonic-clonic seizure by nursing staff after arrival here prior to my evaluation and was postictal on my evaluation.  [name] history is provided by [name] EMS personnel. [name] history is limited by [name] condition of [name].  Seizures Alcohol Problem       Home Medications Prior to Admission medications   Medication Sig Start Date End Date Taking? Authorizing Provider  acetaminophen (TYLENOL) 500 MG tablet Take 1,000 mg by mouth daily.    [provider]  albuterol (VENTOLIN HFA) 108 (90 Base) MCG/ACT inhaler Inhale 2 puffs into [name] lungs every 4 (four) hours as needed for wheezing or shortness of breath. 12/24/22   [provider]  budesonide-formoterol (SYMBICORT) 160-4.5 MCG/ACT inhaler Inhale 2 puffs into [name] lungs 2 (two) times daily. 12/01/22   Ghimire, Werner Lean, MD  doxycycline (VIBRAMYCIN) 100 MG capsule Take 1 capsule (100 mg total) by mouth 2 (two) times daily. 12/16/22   Jacalyn Lefevre, MD  folic acid (FOLVITE) 1 MG tablet Take 1 mg by mouth daily. 12/30/22   [provider]  gabapentin (NEURONTIN) 100 MG capsule Take 100 mg by mouth 3 (three) times daily. 12/30/22   [provider]  hydrOXYzine (ATARAX) 25 MG tablet Take 1 tablet (25 mg total) by mouth every 6 (six) hours. 01/02/23   Mesner, Barbara Cower, MD   Ipratropium-Albuterol (COMBIVENT RESPIMAT) 20-100 MCG/ACT AERS respimat Inhale 1 puff into [name] lungs every 6 (six) hours as needed for wheezing or shortness of breath. 12/12/22   Sherryll Burger, Pratik D, DO  metFORMIN (GLUCOPHAGE-XR) 750 MG 24 hr tablet Take 750 mg by mouth daily. 12/30/22   [provider]  pantoprazole (PROTONIX) 40 MG tablet Take 40 mg by mouth daily. 12/30/22   [provider]  predniSONE (DELTASONE) 50 MG tablet Take 1 tablet (50 mg total) by mouth daily with breakfast. 12/16/22   Jacalyn Lefevre, MD  senna-docusate (SENOKOT-S) 8.6-50 MG tablet Take 1 tablet by mouth 2 (two) times daily as needed for mild constipation. 01/05/23   Tilden Fossa, MD      Allergies    Iron dextran, Aspirin, and Penicillins    Review of Systems   Review of Systems  Neurological:  Positive for seizures.    Physical Exam Updated Vital Signs BP (!) 124/107   Pulse (!) 108   Temp 98.2 F (36.8 C) (Oral)   Resp 15   Ht 5\' 2"  (1.575 m)   Wt 50 kg   LMP 01/08/2011   SpO2 99%   BMI 20.16 kg/m  Physical Exam Vitals and nursing note reviewed.  Constitutional:      Appearance: [name] is not diaphoretic.     Comments: Postictal appearing pulling at lines  HENT:     Head: Normocephalic and atraumatic.     Nose: Nose  normal.     Mouth/Throat:     Mouth: Mucous membranes are moist.     Pharynx: Oropharynx is clear.  Eyes:     Extraocular Movements: Extraocular movements intact.     Conjunctiva/sclera: Conjunctivae normal.     Pupils: Pupils are equal, round, and reactive to light.  Cardiovascular:     Rate and Rhythm: Regular rhythm. Tachycardia present.     Heart sounds: Normal heart sounds.  Pulmonary:     Effort: Pulmonary effort is normal.     Breath sounds: Normal breath sounds.  Abdominal:     General: Abdomen is flat.     Palpations: Abdomen is soft.     Tenderness: There is no abdominal tenderness.  Musculoskeletal:        General: Normal range of motion.      Cervical back: Normal range of motion.  Skin:    General: Skin is warm and dry.  Neurological:     Mental Status: [name] is alert.     Comments: Oriented to person only, moving all 4 extremities spontaneously     ED Results / Procedures / Treatments   Labs (all labs ordered are listed, but only abnormal results are displayed) Labs Reviewed  COMPREHENSIVE METABOLIC PANEL - Abnormal; Notable for [name] following components:      Result Value   Sodium 133 (*)    CO2 15 (*)    Glucose, Bld 195 (*)    Creatinine, Ser 0.42 (*)    Calcium 8.5 (*)    AST 45 (*)    ALT 48 (*)    Alkaline Phosphatase 153 (*)    Anion gap 16 (*)    All other components within normal limits  ETHANOL - Abnormal; Notable for [name] following components:   Alcohol, Ethyl (B) 80 (*)    All other components within normal limits  CBC WITH DIFFERENTIAL/PLATELET - Abnormal; Notable for [name] following components:   WBC 11.3 (*)    Hemoglobin 10.0 (*)    HCT 35.7 (*)    MCH 23.0 (*)    MCHC 28.0 (*)    RDW 20.7 (*)    All other components within normal limits  CBG MONITORING, ED - Abnormal; Notable for [name] following components:   Glucose-Capillary 211 (*)    All other components within normal limits  URINALYSIS, ROUTINE W REFLEX MICROSCOPIC  RAPID URINE DRUG SCREEN, HOSP PERFORMED    EKG EKG Interpretation Date/Time:  Sunday January 11 2023 09:04:18 EDT Ventricular Rate:  100 PR Interval:  186 QRS Duration:  110 QT Interval:  365 QTC Calculation: 471 R Axis:   32  Text Interpretation: Sinus tachycardia LAE, consider biatrial enlargement Minimal ST elevation, anterior leads Since last tracing of earlier today No significant change was found Confirmed by Elayne Snare ([id number]) on 01/11/2023 9:11:37 AM  Radiology DG Chest Port 1 View  Result Date: 01/11/2023 CLINICAL DATA:  Seizure.  Hypoxia. EXAM: PORTABLE CHEST 1 VIEW COMPARISON:  01/03/2023 FINDINGS: [name] lungs are clear without focal pneumonia, edema,  pneumothorax or pleural effusion. [name] cardiopericardial silhouette is within normal limits for size. No acute bony abnormality. Telemetry leads overlie [name] chest. IMPRESSION: No active disease. Electronically Signed   By: Kennith Center M.D.   On: 01/11/2023 09:57   CT Head Wo Contrast  Result Date: 01/11/2023 CLINICAL DATA:  Seizure, new onset with no history of trauma. EXAM: CT HEAD WITHOUT CONTRAST TECHNIQUE: Contiguous axial images were obtained from [name] base of [name] skull through [name]  vertex without intravenous contrast. RADIATION DOSE REDUCTION: This exam was performed according to [name] departmental dose-optimization program which includes automated exposure control, adjustment of the mA and/or kV according to [name] size and/or use of iterative reconstruction technique. COMPARISON:  09/14/2022 FINDINGS: Brain: No evidence of acute infarction, hemorrhage, hydrocephalus, extra-axial collection or mass lesion/mass effect. Vascular: No hyperdense vessel or unexpected calcification. Skull: Normal. Negative for fracture or focal lesion. Sinuses/Orbits: No acute finding. IMPRESSION: No acute or interval finding. Electronically Signed   By: Tiburcio Pea M.D.   On: 01/11/2023 09:50    Procedures Procedures    Medications Ordered in ED Medications  LORazepam (ATIVAN) tablet 1-4 mg (1 mg Oral Given 01/11/23 1052)    Or  LORazepam (ATIVAN) injection 1-4 mg ( Intravenous See Alternative 01/11/23 1052)  thiamine (VITAMIN B1) tablet 100 mg (100 mg Oral Given 01/11/23 1052)    Or  thiamine (VITAMIN B1) injection 100 mg ( Intravenous See Alternative 01/11/23 1052)  folic acid (FOLVITE) tablet 1 mg (1 mg Oral Given 01/11/23 1052)  LORazepam (ATIVAN) 2 MG/ML injection (1 mg  Given 01/11/23 4098)    ED Course/ Medical Decision Making/ A&P Clinical Course as of 01/11/23 1155  Sun Jan 11, 2023  1058 Upon reassessment, [name] is awake and alert and at her neurologic baseline.  [name] is mildly tachycardic and  mildly tremulous concerning for some alcohol withdrawal.  [name] states that her last drink was 4 PM yesterday afternoon and did have a small beer this morning prior to [name] seizure.  [name] states that [name] thinks that [name] may have had alcohol withdrawal seizures in [name] past.  [name] denies any recent cocaine or other substance use.  With concern for alcohol withdrawal with seizure [name] will be admitted for further monitoring. [VK]  1153 I spoke with Dr. Derry Lory of neurology who states that [name] ok to stay at [facility] unless [name] has status or subclinical seizure. Recommended loading with Keppra 20mg /kg and resume home zonisamide. [VK]    Clinical Course User Index [VK] Rexford Maus, DO                             Medical Decision Making This [name] presents to [name] ED with chief complaint(s) of seizure with pertinent past medical history of gall use disorder, schizoaffective, COPD which further complicates [name] presenting complaint. [name] complaint involves an extensive differential diagnosis and also carries with it a high risk of complications and morbidity.    [name] differential diagnosis includes alcohol withdrawal seizure, substance induced seizure, considering ICH or mass effect though no reported trauma, electrolyte abnormality  Additional history obtained: Additional history obtained from EMS  Records reviewed previous admission documents  ED Course and Reassessment: On [name]'s arrival to [name] emergency department [name] was initially alert and oriented in triage at her neurologic baseline and had a seizure shortly after arrival here.  I was immediately alerted by nursing staff and evaluated [name] at bedside.  On my arrival [name] had spontaneously stopped seizing but appeared to be postictal desatted down to [name] 70s.  [name] was placed on nasal cannula with improvement of O2 to 100%.  [name] was given 1 mg of Ativan to prevent recurrent seizure.  [name] did pads placed on [name] bed another seizure  precautions and will have EKG, labs, chest x-ray and head CT performed and will be closely reassessed.  Independent labs interpretation:  [name] following labs were independently  interpreted: Mildly low bicarb and mildly elevated anion gap, consistent with seizure, mildly elevated alcohol level, labs otherwise at [name]'s baseline  Independent visualization of imaging: - I independently visualized [name] following imaging with scope of interpretation limited to determining acute life threatening conditions related to emergency care: Chest x-ray, CT head, which revealed no acute disease  Consultation: - Consulted or discussed management/test interpretation w/ external professional: Hospitalists  Consideration for admission or further workup: [name] requires admission for concern of alcohol withdrawal seizure Social Determinants of health: Alcohol use disorder    Amount and/or Complexity of Data Reviewed Labs: ordered. Radiology: ordered.  Risk OTC drugs. Prescription drug management.          Final Clinical Impression(s) / ED Diagnoses Final diagnoses:  Seizure (HCC)  Alcohol withdrawal syndrome without complication [facility])    Rx / DC Orders ED Discharge Orders     None         Rexford Maus, DO 01/11/23 1103

## 2023-01-11 NOTE — Progress Notes (Signed)
Patient resting with eyes closed; pt has no signs of distress at this time; pt vitals stable; bed alarm on and bed in lowest position

## 2023-01-11 NOTE — H&P (Signed)
History and Physical  [name] WUX:[id number] DOB: [birth year] DOA: 01/11/2023  PCP: Fleet Contras, MD   Chief Complaint: Seizure  HPI: [name] is a 57 y.o. female with medical history significant for daily alcohol abuse, cocaine abuse, GERD, diabetes, hyperlipidemia, bipolar schizophrenia who does not take her medications, and is being admitted to the hospital with witnessed seizure.  Patient tells me that she drinks 1 to 240 ounce beers on a daily basis, she has been in her usual state of health lately but having some stomach discomfort with reflux-like symptoms.  She denies any vomiting but has had some nausea.  No fevers, chills, chest pain, syncope, diarrhea, constipation, or blood in her stool.  This morning, she states she was sitting on the couch at home she was feeling a little bit shaky, had a small can of beer.  Soon after this, she had a generalized clonic seizure witnessed by her husband.  EMS was called, by the time they arrived she was reportedly back to her neurologic baseline.  On arrival to the emergency department, she was reported to be alert and oriented, and then had another witnessed generalized tonic-clonic seizure which lasted about 4 minutes, and resolved spontaneously.  She was then prophylactically given a dose of IV Ativan.  Workup showed relatively unremarkable labs, as well as unremarkable imaging including CT scan of the head, and chest x-ray.  Provider discussed with neurology, who recommended initiation of Keppra, and that patient could be admitted to [facility] hospitalist service.  Currently [name] is resting comfortably, awake and alert, and has no complaints.  She tells me she thinks she had a seizure several years ago, does not think she has ever been placed on seizure medication, unable to tell me why they thought she had a seizure in the past.  On further review of her chart, she actually was admitted to [facility] March 2024 due to seizure felt to be due to  cocaine use, she was seen by neurology at that time and started on zonisamide at discharge.  Review of Systems: Please see HPI for pertinent positives and negatives. A complete 10 system review of systems are otherwise negative.  Past Medical History:  Diagnosis Date   Abdominal pain    Accidental drug overdose April 2013   Anxiety    Atrial fibrillation (HCC) 09/29/11   converted spontaneously   Chronic back pain    Chronic knee pain    Chronic nausea    Chronic pain    COPD (chronic obstructive pulmonary disease) (HCC)    Depression    Diabetes mellitus    states her doctor took her off all DM meds in past month   Diabetic neuropathy (HCC)    Dyspnea    with exertion    GERD (gastroesophageal reflux disease)    Headache(784.0)    migraines    HTN (hypertension)    not on meds since in a year    Hyperlipidemia    Hypothyroidism    not on meds in a while    Mental disorder    Bipolar and schizophrenic   Nausea and vomiting 01/02/2023   Requires supplemental oxygen    as needed per patient    Schizophrenia (HCC)    Schizophrenia, acute (HCC) 11/13/2017   Tobacco abuse    Past Surgical History:  Procedure Laterality Date   ABDOMINAL HYSTERECTOMY     BLADDER SUSPENSION  03/04/2011   Procedure: [facility] PROCEDURE;  Surgeon: Jonna Coup MacDiarmid;  Location: [facility] ORS;  Service: Urology;  Laterality: N/A;   BOWEL RESECTION N/A 04/18/2022   Procedure: SMALL BOWEL RESECTION;  Surgeon: Harriette Bouillon, MD;  Location: [facility] OR;  Service: General;  Laterality: N/A;   CYSTOCELE REPAIR  03/04/2011   Procedure: ANTERIOR REPAIR (CYSTOCELE);  Surgeon: Martina Sinner;  Location: [facility];  Service: Urology;  Laterality: N/A;   CYSTOSCOPY  03/04/2011   Procedure: CYSTOSCOPY;  Surgeon: Jonna Coup MacDiarmid;  Location: [facility];  Service: Urology;  Laterality: N/A;   ESOPHAGOGASTRODUODENOSCOPY (EGD) WITH PROPOFOL N/A 05/12/2017   Procedure: ESOPHAGOGASTRODUODENOSCOPY (EGD) WITH PROPOFOL;  Surgeon:  Ovidio Kin, MD;  Location: [facility];  Service: General;  Laterality: N/A;   GASTRIC ROUX-EN-Y N/A 03/25/2016   Procedure: LAPAROSCOPIC ROUX-EN-Y GASTRIC BYPASS WITH UPPER ENDOSCOPY;  Surgeon: Glenna Fellows, MD;  Location: [facility];  Service: General;  Laterality: N/A;   KNEE SURGERY     LAPAROSCOPIC ASSISTED VAGINAL HYSTERECTOMY  03/04/2011   Procedure: LAPAROSCOPIC ASSISTED VAGINAL HYSTERECTOMY;  Surgeon: Jeani Hawking, MD;  Location: [facility];  Service: Gynecology;  Laterality: N/A;   LAPAROTOMY N/A 04/18/2022   Procedure: EXPLORATORY LAPAROTOMY;  Surgeon: Harriette Bouillon, MD;  Location: [facility] OR;  Service: General;  Laterality: N/A;   LAPAROTOMY N/A 04/24/2022   Procedure: BRING BACK EXPLORATORY LAPAROTOMY;  Surgeon: Violeta Gelinas, MD;  Location: [facility] OR;  Service: General;  Laterality: N/A;   TOTAL HIP ARTHROPLASTY Right 08/27/2022   Procedure: TOTAL HIP ARTHROPLASTY;  Surgeon: Joen Laura, MD;  Location: [facility] OR;  Service: Orthopedics;  Laterality: Right;    Social History:  reports that she has been smoking cigarettes. She has never used smokeless tobacco. She reports current alcohol use. She reports current drug use. Drugs: Cocaine, Marijuana, and "Crack" cocaine.   Allergies  Allergen Reactions   Iron Dextran Shortness Of Breath and Anxiety   Aspirin Nausea And Vomiting and Other (See Comments)    Ok to take tylenol or ibuprofen    Penicillins Other (See Comments)    Unknown. Childhood reaction     Family History  Problem Relation Age of Onset   Heart attack Father        23s   Diabetes Mother    Heart disease Mother    Hypertension Mother    Heart attack Sister        17   COPD Other    Breast cancer Neg Hx      Prior to Admission medications   Medication Sig Start Date End Date Taking? Authorizing Provider  albuterol (VENTOLIN HFA) 108 (90 Base) MCG/ACT inhaler Inhale 1-2 puffs into the lungs every 4 (four) hours as needed for wheezing or shortness of  breath. 12/24/22  Yes [provider]  naproxen sodium (ALEVE) 220 MG tablet Take 220-440 mg by mouth as needed (pain).   Yes [provider]  OXYGEN Inhale 4-5 L into the lungs as needed (COPD).   Yes [provider]  budesonide-formoterol (SYMBICORT) 160-4.5 MCG/ACT inhaler Inhale 2 puffs into the lungs 2 (two) times daily. Patient not taking: Reported on 01/11/2023 12/01/22   Maretta Bees, MD  doxycycline (VIBRAMYCIN) 100 MG capsule Take 1 capsule (100 mg total) by mouth 2 (two) times daily. Patient not taking: Reported on 01/11/2023 12/16/22   Jacalyn Lefevre, MD  folic acid (FOLVITE) 1 MG tablet Take 1 mg by mouth daily. Patient not taking: Reported on 01/11/2023 12/30/22   [provider]  gabapentin (NEURONTIN) 100 MG capsule Take 100 mg by  mouth 3 (three) times daily. Patient not taking: Reported on 01/11/2023 12/30/22   [provider]  hydrOXYzine (ATARAX) 25 MG tablet Take 1 tablet (25 mg total) by mouth every 6 (six) hours. Patient not taking: Reported on 01/11/2023 01/02/23   Mesner, Barbara Cower, MD  Ipratropium-Albuterol (COMBIVENT RESPIMAT) 20-100 MCG/ACT AERS respimat Inhale 1 puff into the lungs every 6 (six) hours as needed for wheezing or shortness of breath. Patient not taking: Reported on 01/11/2023 12/12/22   Maurilio Lovely D, DO  metFORMIN (GLUCOPHAGE-XR) 750 MG 24 hr tablet Take 750 mg by mouth daily. Patient not taking: Reported on 01/11/2023 12/30/22   [provider]  pantoprazole (PROTONIX) 40 MG tablet Take 40 mg by mouth daily. Patient not taking: Reported on 01/11/2023 12/30/22   [provider]  predniSONE (DELTASONE) 50 MG tablet Take 1 tablet (50 mg total) by mouth daily with breakfast. Patient not taking: Reported on 01/11/2023 12/16/22   Jacalyn Lefevre, MD  senna-docusate (SENOKOT-S) 8.6-50 MG tablet Take 1 tablet by mouth 2 (two) times daily as needed for mild constipation. Patient not taking: Reported on 01/11/2023  01/05/23   Tilden Fossa, MD    Physical Exam: BP (!) 152/79   Pulse (!) 103   Temp 98.2 F (36.8 C) (Oral)   Resp 18   Ht 5\' 2"  (1.575 m)   Wt 50 kg   LMP 01/08/2011   SpO2 100%   BMI 20.16 kg/m   General:  Alert, oriented, cooperative but slightly tremulous, in no acute distress  Eyes: EOMI, clear conjuctivae, white sclerea Neck: supple, no masses, trachea mildline  Cardiovascular: Tachycardic and regular, no murmurs or rubs, no peripheral edema  Respiratory: clear to auscultation bilaterally, no wheezes, no crackles  Abdomen: soft, nontender, nondistended, normal bowel tones heard  Skin: dry, no rashes  Musculoskeletal: no joint effusions, normal range of motion  Psychiatric: appropriate affect, normal speech  Neurologic: extraocular muscles intact, clear speech, moving all extremities with intact sensorium          Labs on Admission:  Basic Metabolic Panel: Recent Labs  Lab 01/05/23 0335 01/11/23 0900  NA 139 133*  K 4.0 4.1  CL 100 102  CO2 25 15*  GLUCOSE 134* 195*  BUN 9 9  CREATININE 0.51 0.42*  CALCIUM 9.1 8.5*   Liver Function Tests: Recent Labs  Lab 01/05/23 0335 01/11/23 0900  AST 24 45*  ALT 24 48*  ALKPHOS 120 153*  BILITOT 0.4 0.8  PROT 7.7 7.5  ALBUMIN 3.9 3.9   Recent Labs  Lab 01/05/23 0335  LIPASE 69*   No results for input(s): "AMMONIA" in the last 168 hours. CBC: Recent Labs  Lab 01/05/23 0335 01/11/23 0900  WBC 7.6 11.3*  NEUTROABS  --  7.1  HGB 10.8* 10.0*  HCT 35.4* 35.7*  MCV 80.5 82.3  PLT 324 334   Cardiac Enzymes: No results for input(s): "CKTOTAL", "CKMB", "CKMBINDEX", "TROPONINI" in the last 168 hours.  BNP (last 3 results) Recent Labs    02/12/22 1048 11/20/22 1039 11/28/22 0645  BNP 70.9 84.2 63.9    ProBNP (last 3 results) No results for input(s): "PROBNP" in the last 8760 hours.  CBG: Recent Labs  Lab 01/11/23 0900  GLUCAP 211*    Radiological Exams on Admission: DG Chest Port 1  View  Result Date: 01/11/2023 CLINICAL DATA:  Seizure.  Hypoxia. EXAM: PORTABLE CHEST 1 VIEW COMPARISON:  01/03/2023 FINDINGS: The lungs are clear without focal pneumonia, edema, pneumothorax or pleural effusion.  The cardiopericardial silhouette is within normal limits for size. No acute bony abnormality. Telemetry leads overlie the chest. IMPRESSION: No active disease. Electronically Signed   By: Kennith Center M.D.   On: 01/11/2023 09:57   CT Head Wo Contrast  Result Date: 01/11/2023 CLINICAL DATA:  Seizure, new onset with no history of trauma. EXAM: CT HEAD WITHOUT CONTRAST TECHNIQUE: Contiguous axial images were obtained from the base of the skull through the vertex without intravenous contrast. RADIATION DOSE REDUCTION: This exam was performed according to the departmental dose-optimization program which includes automated exposure control, adjustment of the mA and/or kV according to patient size and/or use of iterative reconstruction technique. COMPARISON:  09/14/2022 FINDINGS: Brain: No evidence of acute infarction, hemorrhage, hydrocephalus, extra-axial collection or mass lesion/mass effect. Vascular: No hyperdense vessel or unexpected calcification. Skull: Normal. Negative for fracture or focal lesion. Sinuses/Orbits: No acute finding. IMPRESSION: No acute or interval finding. Electronically Signed   By: Tiburcio Pea M.D.   On: 01/11/2023 09:50    Assessment/Plan 57 year old female with a history of bipolar schizophrenia, diabetes, polysubstance abuse most commonly cocaine with history of cocaine induced seizure who is noncompliant with all of her medications and being admitted to the hospital with recurrent seizure.  Recurrent seizure-possibly related to alcohol withdrawal or recurrent substance abuse.  She has been noncompliant with her zonisamide, which is likely the main issue. -Inpatient admission to progressive -Given IV Keppra in the emergency department -Seizure  precautions -Continue home dose zonisamide 100mg  po daily -discussed with Dr. Derry Lory who recommends the above, does not plan to formally consult at this time  Alcohol withdrawal-given her alcohol level on arrival, I somewhat doubt that her recurrent seizure is related to alcohol withdrawal.  However, she is showing signs of withdrawal and needs to be treated and monitored closely to prevent worsening withdrawal symptoms. -Thiamine, folate; no MVI due to allergy -Scheduled Librium taper -P.o. Ativan per [facility]WA protocol  History of bipolar schizophrenia-noncompliant with medications  Abnormal LFT-very mild elevation, possibly related to chronic alcohol abuse, trend LFTs and consider abdominal imaging/hepatitis panel if not resolving  Type 2 diabetes-check A1c, carb controlled diet, with sliding scale insulin  GERD-omeprazole  DVT prophylaxis: Lovenox     Code Status: Full Code  Consults called: None  Admission status: The appropriate patient status for this patient is INPATIENT. Inpatient status is judged to be reasonable and necessary in order to provide the required intensity of service to ensure the patient's safety. The patient's presenting symptoms, physical exam findings, and initial radiographic and laboratory data in the context of their chronic comorbidities is felt to place them at high risk for further clinical deterioration. Furthermore, it is not anticipated that the patient will be medically stable for discharge from the hospital within 2 midnights of admission.    I certify that at the point of admission it is my clinical judgment that the patient will require inpatient hospital care spanning beyond 2 midnights from the point of admission due to high intensity of service, high risk for further deterioration and high frequency of surveillance required  Time spent: 56 minutes  Harmonie Verrastro Sharlette Dense MD [facility] Pager [phone]  If 7PM-7AM, please contact  night-coverage [email or web address] Password [facility]  01/11/2023, 12:29 PM

## 2023-01-11 NOTE — ED Notes (Signed)
Pt began having seizure at 0855 which lasted approx 3 min. Pt turned on side and staff came to assist. MD in rm. Pt placed on Tolu.

## 2023-01-11 NOTE — Progress Notes (Addendum)
TOC consulted for substance abuse resources. Resources provided and attached to AVS. No further TOC needs. TOC signing off.

## 2023-01-12 ENCOUNTER — Other Ambulatory Visit (HOSPITAL_COMMUNITY): Payer: Self-pay

## 2023-01-12 DIAGNOSIS — R569 Unspecified convulsions: Secondary | ICD-10-CM | POA: Diagnosis not present

## 2023-01-12 LAB — GLUCOSE, CAPILLARY
Glucose-Capillary: 114 mg/dL — ABNORMAL HIGH (ref 70–99)
Glucose-Capillary: 115 mg/dL — ABNORMAL HIGH (ref 70–99)

## 2023-01-12 LAB — COMPREHENSIVE METABOLIC PANEL
ALT: 37 U/L (ref 0–44)
AST: 33 U/L (ref 15–41)
Albumin: 3.7 g/dL (ref 3.5–5.0)
Alkaline Phosphatase: 144 U/L — ABNORMAL HIGH (ref 38–126)
Anion gap: 9 (ref 5–15)
BUN: 5 mg/dL — ABNORMAL LOW (ref 6–20)
CO2: 26 mmol/L (ref 22–32)
Calcium: 8.9 mg/dL (ref 8.9–10.3)
Chloride: 100 mmol/L (ref 98–111)
Creatinine, Ser: 0.46 mg/dL (ref 0.44–1.00)
GFR, Estimated: >60 mL/min (ref >60)
Glucose, Bld: 120 mg/dL — ABNORMAL HIGH (ref 70–99)
Potassium: 3.6 mmol/L (ref 3.5–5.1)
Sodium: 135 mmol/L (ref 135–145)
Total Bilirubin: 1.6 mg/dL — ABNORMAL HIGH (ref 0.3–1.2)
Total Protein: 7 g/dL (ref 6.5–8.1)

## 2023-01-12 LAB — CBC
HCT: 35.6 % — ABNORMAL LOW (ref 36.0–46.0)
Hemoglobin: 10.6 g/dL — ABNORMAL LOW (ref 12.0–15.0)
MCH: 23.3 pg — ABNORMAL LOW (ref 26.0–34.0)
MCHC: 29.8 g/dL — ABNORMAL LOW (ref 30.0–36.0)
MCV: 78.2 fL — ABNORMAL LOW (ref 80.0–100.0)
Platelets: 281 10*3/uL (ref 150–400)
RBC: 4.55 MIL/uL (ref 3.87–5.11)
RDW: 20.3 % — ABNORMAL HIGH (ref 11.5–15.5)
WBC: 5.1 10*3/uL (ref 4.0–10.5)
nRBC: 0 % (ref 0.0–0.2)

## 2023-01-12 MED ORDER — ZONISAMIDE 100 MG PO CAPS: 200.0000 mg | ORAL_CAPSULE | Freq: Every day | ORAL | 3 refills | Status: DC

## 2023-01-12 MED ORDER — MOMETASONE FURO-FORMOTEROL FUM 200-5 MCG/ACT IN AERO
2.0000 | INHALATION_SPRAY | Freq: Two times a day (BID) | RESPIRATORY_TRACT | Status: DC
  Administered 2023-01-12: 2 via RESPIRATORY_TRACT
  Filled 2023-01-12: qty 8.8

## 2023-01-12 MED ORDER — ZONISAMIDE 100 MG PO CAPS
200.0000 mg | ORAL_CAPSULE | Freq: Every day | ORAL | Status: DC
  Filled 2023-01-12: qty 2

## 2023-01-12 MED ORDER — CHLORDIAZEPOXIDE HCL 25 MG PO CAPS: ORAL_CAPSULE | ORAL | 0 refills | Status: AC

## 2023-01-12 NOTE — Progress Notes (Signed)
Mobility Specialist - Progress Note   01/12/23 0932  Mobility  Activity Ambulated independently in hallway  Level of Assistance Standby assist, set-up cues, supervision of patient - no hands on  Assistive Device None  Distance Ambulated (ft) 700 ft  Range of Motion/Exercises Active  Activity Response Tolerated well  Mobility Referral Yes  $Mobility charge 1 Mobility  Mobility Specialist Start Time (ACUTE ONLY) 0920  Mobility Specialist Stop Time (ACUTE ONLY) 0932  Mobility Specialist Time Calculation (min) (ACUTE ONLY) 12 min   Pt was found in bed and agreeable to ambulate. C/o some R leg pain during session. At EOS returned to bed with all needs met and call bell in reach.  Billey Chang Mobility Specialist

## 2023-01-12 NOTE — Hospital Course (Signed)
57 year old female with a history of bipolar schizophrenia, diabetes, polysubstance abuse most commonly cocaine with history of cocaine induced seizure who is noncompliant with all of her medications and admitted to the hospital with recurrent seizure.   Recurrent seizure - suspect noncompliance with zonegran, ongoing etoh use, and substance use (UDS positive again for cocaine) - case discussed with neurology on admission; zonisamide resumed  Alcohol use - etoh level lower than prior levels; she is vague with etoh use and may have been drinking less than normal - offered hospitalization for detox and she declined but did want to do librium taper at discharge    History of bipolar schizophrenia-noncompliant with medications   Abnormal LFT-very mild elevation, possibly related to chronic alcohol abuse   Type 2 diabetes-continue home regimen   GERD-omeprazole

## 2023-01-12 NOTE — Care Management CC44 (Signed)
Condition Code 44 Documentation Completed  Patient Details  Name: [name] MRN: [id number] Date of Birth: June 21, 1966   Condition Code 44 given:  Yes Patient signature on Condition Code 44 notice:  Yes Documentation of 2 MD's agreement:  Yes Code 44 added to claim:  Yes    Larrie Kass, LCSW 01/12/2023, 10:40 AM

## 2023-01-12 NOTE — Plan of Care (Signed)
  Problem: Education: Goal: Knowledge of General Education information will improve Description: Including pain rating scale, medication(s)/side effects and non-pharmacologic comfort measures 01/12/2023 1015 by Dalphine Handing, RN Outcome: Progressing 01/12/2023 1014 by Dalphine Handing, RN Outcome: Progressing   Problem: Health Behavior/Discharge Planning: Goal: Ability to manage health-related needs will improve 01/12/2023 1015 by Dalphine Handing, RN Outcome: Progressing 01/12/2023 1014 by Dalphine Handing, RN Outcome: Progressing   Problem: Clinical Measurements: Goal: Ability to maintain clinical measurements within normal limits will improve 01/12/2023 1015 by Dalphine Handing, RN Outcome: Progressing 01/12/2023 1014 by Dalphine Handing, RN Outcome: Progressing   Problem: Safety: Goal: Ability to remain free from injury will improve Outcome: Progressing

## 2023-01-12 NOTE — Progress Notes (Signed)
AVS and discharge instructions reviewed with patient and pt's partner at the bedside. Both verbalized understanding and have no further questions. Patient's partner is transportation  method to home.

## 2023-01-12 NOTE — Discharge Summary (Signed)
Physician Discharge Summary   [name] TFT:[id number] DOB: [birth year] DOA: 01/11/2023  PCP: Fleet Contras, MD  Admit date: 01/11/2023 Discharge date: 01/12/2023   Admitted From: Home Disposition:  Home Discharging physician: Lewie Chamber, MD Barriers to discharge: none  Recommendations at discharge: Encourage ongoing compliance and cutting back on etoh use  Discharge Condition: stable CODE STATUS: Full Diet recommendation:  Diet Orders (From admission, onward)     Start     Ordered   01/11/23 1217  Diet Carb Modified Fluid consistency: Thin; Room service appropriate? Yes  Diet effective now       Question Answer Comment  Diet-HS Snack? Nothing   Calorie Level Medium 1600-2000   Fluid consistency: Thin   Room service appropriate? Yes      01/11/23 1217            Hospital Course: 57 year old female with a history of bipolar schizophrenia, diabetes, polysubstance abuse most commonly cocaine with history of cocaine induced seizure who is noncompliant with all of her medications and admitted to the hospital with recurrent seizure.   Recurrent seizure - suspect noncompliance with zonegran, ongoing etoh use, and substance use (UDS positive again for cocaine) - case discussed with neurology on admission; zonisamide resumed  Alcohol use - etoh level lower than prior levels; she is vague with etoh use and may have been drinking less than normal - offered hospitalization for detox and she declined but did want to do librium taper at discharge    History of bipolar schizophrenia-noncompliant with medications   Abnormal LFT-very mild elevation, possibly related to chronic alcohol abuse   Type 2 diabetes-continue home regimen   GERD-omeprazole    The patient's chronic medical conditions were treated accordingly per the patient's home medication regimen except as noted.  On day of discharge, patient was felt deemed stable for discharge. Patient/family member advised  to call PCP or come back to ER if needed.   Principal Diagnosis: Seizure [facility])  Discharge Diagnoses: Active Hospital Problems   Diagnosis Date Noted   Seizure (HCC) 04/17/2022    Priority: 8.    Resolved Hospital Problems  No resolved problems to display.     Discharge Instructions     Increase activity slowly   Complete by: As directed       Allergies as of 01/12/2023       Reactions   Iron Dextran Shortness Of Breath, Anxiety   Aspirin Nausea And Vomiting, Other (See Comments)   Ok to take tylenol or ibuprofen   Penicillins Other (See Comments)   Unknown. Childhood reaction         Medication List     STOP taking these medications    Combivent Respimat 20-100 MCG/ACT Aers respimat Generic drug: Ipratropium-Albuterol   doxycycline 100 MG capsule Commonly known as: VIBRAMYCIN   folic acid 1 MG tablet Commonly known as: FOLVITE   gabapentin 100 MG capsule Commonly known as: NEURONTIN   hydrOXYzine 25 MG tablet Commonly known as: ATARAX   naproxen sodium 220 MG tablet Commonly known as: ALEVE   pantoprazole 40 MG tablet Commonly known as: PROTONIX   predniSONE 50 MG tablet Commonly known as: DELTASONE   senna-docusate 8.6-50 MG tablet Commonly known as: Senokot-S       TAKE these medications    albuterol 108 (90 Base) MCG/ACT inhaler Commonly known as: VENTOLIN HFA Inhale 1-2 puffs into the lungs every 4 (four) hours as needed for wheezing or shortness of breath.   chlordiazePOXIDE 25  MG capsule Commonly known as: LIBRIUM Take 1 capsule (25 mg total) by mouth 4 (four) times daily for 1 day, THEN 1 capsule (25 mg total) 3 (three) times daily for 1 day, THEN 1 capsule (25 mg total) 2 (two) times daily in the am and at bedtime. for 1 day, THEN 1 capsule (25 mg total) daily for 1 day. Start taking on: January 12, 2023   metFORMIN 750 MG 24 hr tablet Commonly known as: GLUCOPHAGE-XR Take 750 mg by mouth daily.   OXYGEN Inhale 4-5 L into the  lungs as needed (COPD).   Symbicort 160-4.5 MCG/ACT inhaler Generic drug: budesonide-formoterol Inhale 2 puffs into the lungs 2 (two) times daily.   zonisamide 100 MG capsule Commonly known as: ZONEGRAN Take 2 capsules (200 mg total) by mouth daily.        Allergies  Allergen Reactions   Iron Dextran Shortness Of Breath and Anxiety   Aspirin Nausea And Vomiting and Other (See Comments)    Ok to take tylenol or ibuprofen    Penicillins Other (See Comments)    Unknown. Childhood reaction     Consultations:   Procedures:   Discharge Exam: BP 132/75 (BP Location: Left Arm)   Pulse 91   Temp 98.1 F (36.7 C) (Oral)   Resp 17   Ht 5\' 2"  (1.575 m)   Wt 50 kg   LMP 01/08/2011   SpO2 98%   BMI 20.16 kg/m  Physical Exam Constitutional:      General: She is not in acute distress.    Appearance: Normal appearance.  HENT:     Head: Normocephalic and atraumatic.     Mouth/Throat:     Mouth: Mucous membranes are moist.  Eyes:     Extraocular Movements: Extraocular movements intact.  Cardiovascular:     Rate and Rhythm: Normal rate and regular rhythm.  Pulmonary:     Effort: Pulmonary effort is normal. No respiratory distress.     Breath sounds: Normal breath sounds. No wheezing.  Abdominal:     General: Bowel sounds are normal. There is no distension.     Palpations: Abdomen is soft.     Tenderness: There is no abdominal tenderness.  Musculoskeletal:        General: Normal range of motion.     Cervical back: Normal range of motion and neck supple.  Skin:    General: Skin is warm and dry.  Neurological:     General: No focal deficit present.     Mental Status: She is alert.     Comments: Mild resting tremor in hands  Psychiatric:        Mood and Affect: Mood normal.      The results of significant diagnostics from this hospitalization (including imaging, microbiology, ancillary and laboratory) are listed below for reference.   Microbiology: No results  found for this or any previous visit (from the past 240 hour(s)).   Labs: BNP (last 3 results) Recent Labs    02/12/22 1048 11/20/22 1039 11/28/22 0645  BNP 70.9 84.2 63.9   Basic Metabolic Panel: Recent Labs  Lab 01/11/23 0900 01/12/23 0354  NA 133* 135  K 4.1 3.6  CL 102 100  CO2 15* 26  GLUCOSE 195* 120*  BUN 9 5*  CREATININE 0.42* 0.46  CALCIUM 8.5* 8.9   Liver Function Tests: Recent Labs  Lab 01/11/23 0900 01/12/23 0354  AST 45* 33  ALT 48* 37  ALKPHOS 153* 144*  BILITOT 0.8 1.6*  PROT 7.5 7.0  ALBUMIN 3.9 3.7   No results for input(s): "LIPASE", "AMYLASE" in the last 168 hours. No results for input(s): "AMMONIA" in the last 168 hours. CBC: Recent Labs  Lab 01/11/23 0900 01/12/23 0354  WBC 11.3* 5.1  NEUTROABS 7.1  --   HGB 10.0* 10.6*  HCT 35.7* 35.6*  MCV 82.3 78.2*  PLT 334 281   Cardiac Enzymes: No results for input(s): "CKTOTAL", "CKMB", "CKMBINDEX", "TROPONINI" in the last 168 hours. BNP: Invalid input(s): "POCBNP" CBG: Recent Labs  Lab 01/11/23 0900 01/11/23 1310 01/11/23 1704 01/11/23 2052 01/12/23 0757  GLUCAP 211* 114* 126* 128* 115*   D-Dimer No results for input(s): "DDIMER" in the last 72 hours. Hgb A1c Recent Labs    01/11/23 0900  HGBA1C 7.3*   Lipid Profile No results for input(s): "CHOL", "HDL", "LDLCALC", "TRIG", "CHOLHDL", "LDLDIRECT" in the last 72 hours. Thyroid function studies No results for input(s): "TSH", "T4TOTAL", "T3FREE", "THYROIDAB" in the last 72 hours.  Invalid input(s): "FREET3" Anemia work up No results for input(s): "VITAMINB12", "FOLATE", "FERRITIN", "TIBC", "IRON", "RETICCTPCT" in the last 72 hours. Urinalysis    Component Value Date/Time   COLORURINE YELLOW 01/11/2023 1221   APPEARANCEUR HAZY (A) 01/11/2023 1221   LABSPEC 1.017 01/11/2023 1221   PHURINE 5.0 01/11/2023 1221   GLUCOSEU NEGATIVE 01/11/2023 1221   HGBUR NEGATIVE 01/11/2023 1221   BILIRUBINUR NEGATIVE 01/11/2023 1221    KETONESUR NEGATIVE 01/11/2023 1221   PROTEINUR 30 (A) 01/11/2023 1221   UROBILINOGEN 0.2 09/04/2013 0426   NITRITE NEGATIVE 01/11/2023 1221   LEUKOCYTESUR NEGATIVE 01/11/2023 1221   Sepsis Labs Recent Labs  Lab 01/11/23 0900 01/12/23 0354  WBC 11.3* 5.1   Microbiology No results found for this or any previous visit (from the past 240 hour(s)).  Procedures/Studies: DG Chest Port 1 View  Result Date: 01/11/2023 CLINICAL DATA:  Seizure.  Hypoxia. EXAM: PORTABLE CHEST 1 VIEW COMPARISON:  01/03/2023 FINDINGS: The lungs are clear without focal pneumonia, edema, pneumothorax or pleural effusion. The cardiopericardial silhouette is within normal limits for size. No acute bony abnormality. Telemetry leads overlie the chest. IMPRESSION: No active disease. Electronically Signed   By: Kennith Center M.D.   On: 01/11/2023 09:57   CT Head Wo Contrast  Result Date: 01/11/2023 CLINICAL DATA:  Seizure, new onset with no history of trauma. EXAM: CT HEAD WITHOUT CONTRAST TECHNIQUE: Contiguous axial images were obtained from the base of the skull through the vertex without intravenous contrast. RADIATION DOSE REDUCTION: This exam was performed according to the departmental dose-optimization program which includes automated exposure control, adjustment of the mA and/or kV according to patient size and/or use of iterative reconstruction technique. COMPARISON:  09/14/2022 FINDINGS: Brain: No evidence of acute infarction, hemorrhage, hydrocephalus, extra-axial collection or mass lesion/mass effect. Vascular: No hyperdense vessel or unexpected calcification. Skull: Normal. Negative for fracture or focal lesion. Sinuses/Orbits: No acute finding. IMPRESSION: No acute or interval finding. Electronically Signed   By: Tiburcio Pea M.D.   On: 01/11/2023 09:50   CT ABDOMEN PELVIS W CONTRAST  Result Date: 01/05/2023 CLINICAL DATA:  57 year old female with abdominal and low back pain. EXAM: CT ABDOMEN AND PELVIS WITH  CONTRAST TECHNIQUE: Multidetector CT imaging of the abdomen and pelvis was performed using the standard protocol following bolus administration of intravenous contrast. RADIATION DOSE REDUCTION: This exam was performed according to the departmental dose-optimization program which includes automated exposure control, adjustment of the mA and/or kV according to patient size and/or use of iterative reconstruction technique. CONTRAST:  75mL OMNIPAQUE IOHEXOL 350 MG/ML SOLN COMPARISON:  CT Abdomen and Pelvis 08/25/2022. FINDINGS: Lower chest: Improved lung volumes, negative. Hepatobiliary: Negative liver and gallbladder. No bile duct enlargement. Pancreas: Negative. Spleen: Negative. Adrenals/Urinary Tract: Normal adrenal glands. Nonobstructed kidneys with symmetric renal enhancement and contrast excretion. Unremarkable ureters, with contrast enhanced bilateral ureteral jets. Unremarkable urinary bladder. Stomach/Bowel: Highly redundant sigmoid colon which contains gas and stool above the umbilicus in the midline. Similar retained stool throughout the descending colon and to the splenic flexure. Transverse colon is completely decompressed. Right colon is completely decompressed. No obvious large bowel inflammation. Normal appendix on coronal image 72 tracking medial from the cecum. Terminal ileum is decompressed. No dilated small bowel. Chronic Roux-en-Y type gastric bypass. Decompressed bypassed portion of the stomach. Decompressed duodenum. Stable downstream small bowel anastomosis series 3, image 37. And there is a 2nd more gas distended side to side small bowel anastomosis in the left abdomen. But no upstream or downstream dilated small bowel. No free air or free fluid identified. Vascular/Lymphatic: Aortoiliac calcified atherosclerosis. Major arterial structures are patent. Normal caliber abdominal aorta. Portal venous system is patent. No lymphadenopathy identified. Reproductive: Chronically absent uterus.  Diminutive or absent ovaries. Other: Mild streak artifact in the lower pelvis related to hip arthroplasty. No free fluid is evident. Musculoskeletal: Right hip arthroplasty is new since the March CT. No acute osseous abnormality identified. Chronic degeneration lumbar facets, anterior inferior right SI joint. IMPRESSION: 1. No acute or inflammatory process identified in the abdomen or pelvis. Chronic Roux-en-Y type gastric bypass, and a second area of previous small bowel resection/anastomosis in the right abdomen. No evidence of bowel obstruction or inflammation. But there is abundant retained stool in the descending and redundant sigmoid colon. 2. New right hip arthroplasty since March. 3.  Aortic Atherosclerosis (ICD10-I70.0). Electronically Signed   By: Odessa Fleming M.D.   On: 01/05/2023 05:38   DG Chest 2 View  Result Date: 01/03/2023 CLINICAL DATA:  Altered mental status. EXAM: CHEST - 2 VIEW COMPARISON:  January 02, 2023 FINDINGS: The heart size and mediastinal contours are within normal limits. Mild, diffuse, chronic appearing increased lung markings are seen. Mild atelectasis is noted within the bilateral lung bases. No pleural effusion or pneumothorax is identified. The visualized skeletal structures are unremarkable. IMPRESSION: Mild bibasilar atelectasis. Electronically Signed   By: Aram Candela M.D.   On: 01/03/2023 02:16   DG Chest Portable 1 View  Result Date: 01/02/2023 CLINICAL DATA:  57 year old female with shortness of breath. EXAM: PORTABLE CHEST 1 VIEW COMPARISON:  Portable chest 01/01/2023 and earlier. FINDINGS: Portable AP semi upright view at 0408 hours. Lung volumes and mediastinal contours not significantly changed, within normal limits. Visualized tracheal air column is within normal limits. Allowing for portable technique the lungs are clear. No pneumothorax. No acute osseous abnormality identified. Negative visible bowel gas. IMPRESSION: Negative portable chest. Electronically  Signed   By: Odessa Fleming M.D.   On: 01/02/2023 04:56   DG Chest Portable 1 View  Result Date: 01/01/2023 CLINICAL DATA:  Shortness of breath. EXAM: PORTABLE CHEST 1 VIEW COMPARISON:  CTA chest and portable chest both 12/16/2022 FINDINGS: The lungs are slightly emphysematous but clear. No pleural effusion is seen. There is mild cardiomegaly without evidence of CHF. The mediastinum is normally outlined. Mild aortic atherosclerosis. There is slight thoracic dextroscoliosis and mild thoracic spondylosis. Multiple overlying monitor wires. IMPRESSION: No evidence of acute chest disease.  Emphysema. Mild cardiomegaly. Aortic atherosclerosis. Electronically Signed   By: Mellody Dance  Chesser M.D.   On: 01/01/2023 04:54   CT Angio Chest PE W and/or Wo Contrast  Result Date: 12/16/2022 CLINICAL DATA:  Pulmonary embolism (PE) suspected, high prob EXAM: CT ANGIOGRAPHY CHEST WITH CONTRAST TECHNIQUE: Multidetector CT imaging of the chest was performed using the standard protocol during bolus administration of intravenous contrast. Multiplanar CT image reconstructions and MIPs were obtained to evaluate the vascular anatomy. RADIATION DOSE REDUCTION: This exam was performed according to the departmental dose-optimization program which includes automated exposure control, adjustment of the mA and/or kV according to patient size and/or use of iterative reconstruction technique. CONTRAST:  75mL OMNIPAQUE IOHEXOL 350 MG/ML SOLN COMPARISON:  12/05/2022 FINDINGS: Cardiovascular: Satisfactory opacification of the pulmonary arteries to the segmental level. No evidence of pulmonary embolism. Pulmonary trunk is dilated at 3.6 cm, unchanged from prior. Thoracic aorta is nonaneurysmal. Scattered atherosclerotic vascular calcifications of the aorta and coronary arteries. Normal heart size. No pericardial effusion. Mediastinum/Nodes: No enlarged mediastinal, hilar, or axillary lymph nodes. Thyroid gland, trachea, and esophagus demonstrate no  significant findings. Lungs/Pleura: Persistent but improving nodular opacities, predominantly within the superior segment of the left lower lobe. Several of the smaller nodules have since resolved. No new focal airspace consolidation or area of nodularity. Mild paraseptal emphysema. No pleural effusion or pneumothorax. Upper Abdomen: Postsurgical changes from gastric bypass. No acute abnormality within the included upper abdomen. Musculoskeletal: Chronic compression deformity of the T9 vertebral body. No new or acute bony abnormality. No chest wall abnormality. Review of the MIP images confirms the above findings. IMPRESSION: 1. Negative for pulmonary embolism. 2. Persistent but improving nodular opacities, predominantly within the superior segment of the left lower lobe. Several of the smaller nodules have since resolved. Findings are favored to represent a resolving infectious or inflammatory process. 3. Dilated pulmonary trunk, which can be seen in the setting of pulmonary arterial hypertension. Aortic Atherosclerosis (ICD10-I70.0) and Emphysema (ICD10-J43.9). Electronically Signed   By: Duanne Guess D.O.   On: 12/16/2022 09:46   DG Chest Port 1 View  Result Date: 12/16/2022 CLINICAL DATA:  Questionable sepsis.  Evaluate for infiltrates. EXAM: PORTABLE CHEST 1 VIEW COMPARISON:  None Available. FINDINGS: The heart size and mediastinal contours are within normal limits. Both lungs are clear with interval resolution of the perihilar linear atelectasis noted previously. The visualized skeletal structures are unremarkable. IMPRESSION: No active disease. Electronically Signed   By: Almira Bar M.D.   On: 12/16/2022 07:00     Time coordinating discharge: Over 30 minutes    Lewie Chamber, MD  [facility] 01/12/2023, 2:46 PM

## 2023-01-18 ENCOUNTER — Encounter (HOSPITAL_COMMUNITY): Payer: Self-pay

## 2023-01-18 ENCOUNTER — Other Ambulatory Visit: Payer: Self-pay

## 2023-01-18 ENCOUNTER — Emergency Department (HOSPITAL_COMMUNITY)
Admission: EM | Admit: 2023-01-18 | Discharge: 2023-01-18 | Disposition: A | Discharge status: Home / Self Care | Payer: 59 | Attending: Emergency Medicine | Admitting: Emergency Medicine

## 2023-01-18 DIAGNOSIS — F1020 Alcohol dependence, uncomplicated: Secondary | ICD-10-CM | POA: Insufficient documentation

## 2023-01-18 DIAGNOSIS — Z79899 Other long term (current) drug therapy: Secondary | ICD-10-CM | POA: Diagnosis not present

## 2023-01-18 DIAGNOSIS — Z046 Encounter for general psychiatric examination, requested by authority: Secondary | ICD-10-CM

## 2023-01-18 DIAGNOSIS — R4781 Slurred speech: Secondary | ICD-10-CM | POA: Diagnosis not present

## 2023-01-18 DIAGNOSIS — F10129 Alcohol abuse with intoxication, unspecified: Secondary | ICD-10-CM | POA: Diagnosis not present

## 2023-01-18 DIAGNOSIS — J449 Chronic obstructive pulmonary disease, unspecified: Secondary | ICD-10-CM | POA: Insufficient documentation

## 2023-01-18 DIAGNOSIS — F250 Schizoaffective disorder, bipolar type: Secondary | ICD-10-CM | POA: Diagnosis not present

## 2023-01-18 DIAGNOSIS — R259 Unspecified abnormal involuntary movements: Secondary | ICD-10-CM | POA: Insufficient documentation

## 2023-01-18 DIAGNOSIS — R451 Restlessness and agitation: Secondary | ICD-10-CM | POA: Insufficient documentation

## 2023-01-18 DIAGNOSIS — F1994 Other psychoactive substance use, unspecified with psychoactive substance-induced mood disorder: Secondary | ICD-10-CM | POA: Diagnosis present

## 2023-01-18 LAB — CBC WITH DIFFERENTIAL/PLATELET
Abs Immature Granulocytes: 0.01 10*3/uL (ref 0.00–0.07)
Basophils Absolute: 0 10*3/uL (ref 0.0–0.1)
Basophils Relative: 1 %
Eosinophils Absolute: 0.1 10*3/uL (ref 0.0–0.5)
Eosinophils Relative: 1 %
HCT: 32.3 % — ABNORMAL LOW (ref 36.0–46.0)
Hemoglobin: 9.6 g/dL — ABNORMAL LOW (ref 12.0–15.0)
Immature Granulocytes: 0 %
Lymphocytes Relative: 42 %
Lymphs Abs: 2.8 10*3/uL (ref 0.7–4.0)
MCH: 23.4 pg — ABNORMAL LOW (ref 26.0–34.0)
MCHC: 29.7 g/dL — ABNORMAL LOW (ref 30.0–36.0)
MCV: 78.8 fL — ABNORMAL LOW (ref 80.0–100.0)
Monocytes Absolute: 0.3 10*3/uL (ref 0.1–1.0)
Monocytes Relative: 5 %
Neutro Abs: 3.3 10*3/uL (ref 1.7–7.7)
Neutrophils Relative %: 51 %
Platelets: 218 10*3/uL (ref 150–400)
RBC: 4.1 MIL/uL (ref 3.87–5.11)
RDW: 21.2 % — ABNORMAL HIGH (ref 11.5–15.5)
WBC: 6.6 10*3/uL (ref 4.0–10.5)
nRBC: 0 % (ref 0.0–0.2)

## 2023-01-18 LAB — COMPREHENSIVE METABOLIC PANEL
ALT: 23 U/L (ref 0–44)
AST: 30 U/L (ref 15–41)
Albumin: 3.6 g/dL (ref 3.5–5.0)
Alkaline Phosphatase: 112 U/L (ref 38–126)
Anion gap: 12 (ref 5–15)
BUN: 11 mg/dL (ref 6–20)
CO2: 23 mmol/L (ref 22–32)
Calcium: 8.1 mg/dL — ABNORMAL LOW (ref 8.9–10.3)
Chloride: 102 mmol/L (ref 98–111)
Creatinine, Ser: 0.44 mg/dL (ref 0.44–1.00)
GFR, Estimated: >60 mL/min (ref >60)
Glucose, Bld: 111 mg/dL — ABNORMAL HIGH (ref 70–99)
Potassium: 3.8 mmol/L (ref 3.5–5.1)
Sodium: 137 mmol/L (ref 135–145)
Total Bilirubin: 0.4 mg/dL (ref 0.3–1.2)
Total Protein: 7.1 g/dL (ref 6.5–8.1)

## 2023-01-18 LAB — CBG MONITORING, ED: Glucose-Capillary: 250 mg/dL — ABNORMAL HIGH (ref 70–99)

## 2023-01-18 LAB — ETHANOL: Alcohol, Ethyl (B): 353 mg/dL (ref <10)

## 2023-01-18 MED ORDER — THIAMINE HCL 100 MG/ML IJ SOLN: 100.0000 mg | Freq: Every day | INTRAMUSCULAR | Status: DC

## 2023-01-18 MED ORDER — INSULIN ASPART 100 UNIT/ML IJ SOLN
0.0000 [IU] | Freq: Three times a day (TID) | INTRAMUSCULAR | Status: DC
  Administered 2023-01-18: 3 [IU] via SUBCUTANEOUS

## 2023-01-18 MED ORDER — ADULT MULTIVITAMIN W/MINERALS CH
1.0000 | ORAL_TABLET | Freq: Every day | ORAL | Status: DC
  Administered 2023-01-18: 1 via ORAL
  Filled 2023-01-18: qty 1

## 2023-01-18 MED ORDER — THIAMINE MONONITRATE 100 MG PO TABS
100.0000 mg | ORAL_TABLET | Freq: Every day | ORAL | Status: DC
  Administered 2023-01-18: 100 mg via ORAL
  Filled 2023-01-18: qty 1

## 2023-01-18 MED ORDER — HALOPERIDOL LACTATE 5 MG/ML IJ SOLN
5.0000 mg | Freq: Once | INTRAMUSCULAR | Status: AC
  Administered 2023-01-18: 5 mg via INTRAMUSCULAR
  Filled 2023-01-18: qty 1

## 2023-01-18 MED ORDER — METFORMIN HCL 500 MG PO TABS
500.0000 mg | ORAL_TABLET | Freq: Two times a day (BID) | ORAL | Status: DC
  Administered 2023-01-18: 500 mg via ORAL
  Filled 2023-01-18: qty 1

## 2023-01-18 MED ORDER — NICOTINE 21 MG/24HR TD PT24
21.0000 mg | MEDICATED_PATCH | Freq: Once | TRANSDERMAL | Status: DC
  Administered 2023-01-18: 21 mg via TRANSDERMAL
  Filled 2023-01-18: qty 1

## 2023-01-18 MED ORDER — FOLIC ACID 1 MG PO TABS
1.0000 mg | ORAL_TABLET | Freq: Every day | ORAL | Status: DC
  Administered 2023-01-18: 1 mg via ORAL
  Filled 2023-01-18: qty 1

## 2023-01-18 MED ORDER — MIDAZOLAM HCL 2 MG/2ML IJ SOLN
2.0000 mg | Freq: Once | INTRAMUSCULAR | Status: AC
  Administered 2023-01-18: 2 mg via INTRAMUSCULAR
  Filled 2023-01-18: qty 2

## 2023-01-18 MED ORDER — LORAZEPAM 1 MG PO TABS: 1.0000 mg | ORAL_TABLET | ORAL | Status: DC | PRN

## 2023-01-18 NOTE — Consult Note (Addendum)
BH ED ASSESSMENT   Reason for Consult:  Psych Consult Referring Physician:  Gilda Crease, MD  Patient Identification: [name] MRN:  [id number] ED Chief Complaint: Substance induced mood disorder (HCC)  Diagnosis:  Principal Problem:   Substance induced mood disorder (HCC) Active Problems:   Alcohol abuse with intoxication (HCC)   Alcohol use disorder, severe, dependence (HCC)   ED Assessment Time Calculation: Start Time: 0800 Stop Time: 0830 Total Time in Minutes (Assessment Completion): 30   Subjective:    [name] is a 57 y.o. AA female with a past psychiatric history of schizoaffective disorder bipolar type, polysubstance abuse (though primarily crack and ETOH), substance induced mood disorder, substance induced psychosis, severe EtOH disorder, schizophrenia, bipolar, and MDD, with pertinent medical history/comorbidities of seizures (recently hospitalized 01/11/2023 for a recrudescence), COPD with frequent exacerbations, diabetes type 2, tobacco use, closed fracture of left proximal humerus, hypertension, hyperlipidemia, GERD, hypothyroidism, and gastric bypass s/p, and additional pertinent and past psychiatric history of multiple emergency department visits and hospitalizations for suicide attempts by way of intentional overdose within the last several years, most recent at the beginning of this year, as well as multiple hospitalizations/emergency department visits for decompensation of the patient's mental health in the form of primary versus substance-induced psychosis/mood decompensation, who presented this encounter by way of GPD after being IVC'd by the patient's daughter, after witnessing her mother threatening suicide while under the influence of alcohol. Patient currently remains under IVC and is currently medically cleared per EDP team for psychiatric evaluation.   Patient is very familiar to the behavioral health psychiatry emergency department team at [facility]  health.  Patient frequently arrives to the emergency department after making suicidal comments in the presence of her daughter while under the influence of EtOH, requiring the patient to be brought to the facility for safety, until she is sober and no longer expressing thoughts of suicide.  HPI:    Patient seen this morning for face-to-face psychiatric evaluation at [facility] emergency department.  Upon evaluation, patient tells me that she was at her mother's house down the street drinking EtOH, specifically a 40 ounce of malt liquor, when her daughter arrived at the home to drive her back to her house and started telling her she needs to quit drinking for the night, to which she proceeded to get in her daughter's car and arrived at her house down the street, when she proceeded to tell her daughter she would not get out of the car until her daughter bought her another 40 ounce of malt liquor, and when her daughter refused to buy her an additional beer, she demanded her daughter take her down the street to a vacant lot to finish her beer she was drinking, but denies ever making any suicidal comments, despite her daughter's endorsements.  Patient endorses during our time together for evaluation that she is not having any suicidal or homicidal ideations, no auditory visual hallucinations, no paranoia, and/or signs or symptoms of alcohol withdrawal.  Patient endorses that she is not ready to address her EtOH use disorder and/or stop using cocaine, states she has not used cocaine however in "a while", though notably UDS on 01/11/2023 was positive for cocaine.  Patient orientation upon evaluation is intact.  Patient endorses that she is going to leave the hospital after being picked up by her daughter and proceeded to likely start drinking, "just like I always do".  Discussed extensively taking prescribed medications for her medical comorbidities, for  mental health, and addressing her substance abuse, but patient  refused to entertain any parts of these subjects.  Patient objectively does not present with any EtOH withdrawal symptomology, psychotic features, and/or disturbances in her mood at the time of evaluation.  Patient clinically appears sober, able to walk without stumbling, and not showing signs of intoxication.  Patient endorses she continues to utilize tobacco daily, no changes to this from previous evaluation the other week by this Clinical research associate.  Patient endorses she has not had any recent suicide attempts and/or self injures behavior, reiterates that she was not expressing suicidal ideations like her daughter states that she was prior to admission.  Patient endorses no changes to her mental health outpatient and/or inpatient history, reports not having participation in either of these recently since last evaluation.   Collateral (Dargan,[name], [id number])    Call placed and collateral obtained from Ms. cherish, the patient's daughter.  Ms. cherish the patient's daughter affirms the story told by the patient, with notable but important detail different from the patient's story being that she endorses firmly that her mother was endorsing suicidal ideations, also states that other family members have heard her make these comments all throughout the day on many occasions (including yesterday), but they often do not tell Ms. [name], because they do not want to cause any problems and have the patient brought to the hospital and be mad at them.  Discussed with Ms. [name] that the patient continues to not be amenable to substance abuse treatment, pharmacological intervention, or following up with resources provided during our discussion.  Discussed with the patient's daughter safety planning for safe and appropriate discharge back home, to which daughter reported she did not have any safety concerns, and she will help watch after her mother upon discharge today, states that she will help either pick her up and  take her home and/or send for transportation.  Past Psychiatric History:  schizoaffective disorder bipolar type, polysubstance abuse (though primarily crack and ETOH), substance induced mood disorder, substance induced psychosis, severe EtOH disorder, schizophrenia, bipolar, and MDD; Additional pertinent and past psychiatric history of multiple emergency department visits and hospitalizations for suicide attempts by way of intentional overdose within the last several years, most recent at the beginning of this year, as well as multiple hospitalizations for decompensation of the patient's mental health in the form of primary versus substance-induced psychosis/mood disorder.   Risk to Self or Others: Is the patient at risk to self? No Has the patient been a risk to self in the past 6 months? Yes Has the patient been a risk to self within the distant past? Yes Is the patient a risk to others? No Has the patient been a risk to others in the past 6 months? No Has the patient been a risk to others within the distant past? No  [place] Scale:  Flowsheet Row ED from 01/18/2023 in [facility] Emergency Department at [facility] ED to Hosp-Admission (Discharged) from 01/11/2023 in [facility]LO[facility] UROLOG[facility] ED from 01/05/2023 in [facility] Emergency Department at [facility]  C-SSRS RISK CATEGORY No Risk No Risk No Risk      Substance Abuse:  Primarily utilizes crack cocaine, EtOH daily, tobacco   Past Medical History:  Past Medical History:  Diagnosis Date   Abdominal pain    Accidental drug overdose April 2013   Anxiety    Atrial fibrillation (HCC) 09/29/11   converted spontaneously   Chronic back pain  Chronic knee pain    Chronic nausea    Chronic pain    COPD (chronic obstructive pulmonary disease) (HCC)    Depression    Diabetes mellitus    states her doctor took her off all DM meds in past month   Diabetic neuropathy (HCC)    Dyspnea    with  exertion    GERD (gastroesophageal reflux disease)    Headache(784.0)    migraines    HTN (hypertension)    not on meds since in a year    Hyperlipidemia    Hypothyroidism    not on meds in a while    Mental disorder    Bipolar and schizophrenic   Nausea and vomiting 01/02/2023   Requires supplemental oxygen    as needed per patient    Schizophrenia (HCC)    Schizophrenia, acute (HCC) 11/13/2017   Tobacco abuse     Past Surgical History:  Procedure Laterality Date   ABDOMINAL HYSTERECTOMY     BLADDER SUSPENSION  03/04/2011   Procedure: [facility] PROCEDURE;  Surgeon: Jonna Coup MacDiarmid;  Location: [facility] ORS;  Service: Urology;  Laterality: N/A;   BOWEL RESECTION N/A 04/18/2022   Procedure: SMALL BOWEL RESECTION;  Surgeon: Harriette Bouillon, MD;  Location: [facility] OR;  Service: General;  Laterality: N/A;   CYSTOCELE REPAIR  03/04/2011   Procedure: ANTERIOR REPAIR (CYSTOCELE);  Surgeon: Martina Sinner;  Location: [facility];  Service: Urology;  Laterality: N/A;   CYSTOSCOPY  03/04/2011   Procedure: CYSTOSCOPY;  Surgeon: Jonna Coup MacDiarmid;  Location: [facility];  Service: Urology;  Laterality: N/A;   ESOPHAGOGASTRODUODENOSCOPY (EGD) WITH PROPOFOL N/A 05/12/2017   Procedure: ESOPHAGOGASTRODUODENOSCOPY (EGD) WITH PROPOFOL;  Surgeon: Ovidio Kin, MD;  Location: [facility];  Service: General;  Laterality: N/A;   GASTRIC ROUX-EN-Y N/A 03/25/2016   Procedure: LAPAROSCOPIC ROUX-EN-Y GASTRIC BYPASS WITH UPPER ENDOSCOPY;  Surgeon: Glenna Fellows, MD;  Location: [facility] ORS;  Service: General;  Laterality: N/A;   KNEE SURGERY     LAPAROSCOPIC ASSISTED VAGINAL HYSTERECTOMY  03/04/2011   Procedure: LAPAROSCOPIC ASSISTED VAGINAL HYSTERECTOMY;  Surgeon: Jeani Hawking, MD;  Location: [facility];  Service: Gynecology;  Laterality: N/A;   LAPAROTOMY N/A 04/18/2022   Procedure: EXPLORATORY LAPAROTOMY;  Surgeon: Harriette Bouillon, MD;  Location: [facility] OR;  Service: General;  Laterality: N/A;   LAPAROTOMY N/A 04/24/2022    Procedure: BRING BACK EXPLORATORY LAPAROTOMY;  Surgeon: Violeta Gelinas, MD;  Location: [facility] OR;  Service: General;  Laterality: N/A;   TOTAL HIP ARTHROPLASTY Right 08/27/2022   Procedure: TOTAL HIP ARTHROPLASTY;  Surgeon: Joen Laura, MD;  Location: [facility] OR;  Service: Orthopedics;  Laterality: Right;   Family History:  Family History  Problem Relation Age of Onset   Heart attack Father        32s   Diabetes Mother    Heart disease Mother    Hypertension Mother    Heart attack Sister        21   COPD Other    Breast cancer Neg Hx    Family Psychiatric  History: As reported Social History:  Social History   Substance and Sexual Activity  Alcohol Use Yes   Comment: 40 ounces daily     Social History   Substance and Sexual Activity  Drug Use Yes   Types: Cocaine, Marijuana, "Crack" cocaine   Comment: last use was about a week ago    Social History   Socioeconomic History   Marital status: Legally Separated  Spouse name: Not on file   Number of children: Not on file   Years of education: Not on file   Highest education level: Not on file  Occupational History   Not on file  Tobacco Use   Smoking status: Every Day    Current packs/day: 1.00    Types: Cigarettes   Smokeless tobacco: Never  Vaping Use   Vaping status: Never Used  Substance and Sexual Activity   Alcohol use: Yes    Comment: 40 ounces daily   Drug use: Yes    Types: Cocaine, Marijuana, "Crack" cocaine    Comment: last use was about a week ago   Sexual activity: Yes    Partners: Male  Other Topics Concern   Not on file  Social History Narrative   Not on file   Social Determinants of Health   Financial Resource Strain: Not on file  Food Insecurity: No Food Insecurity (01/11/2023)   Hunger Vital Sign    Worried About Running Out of Food in the Last Year: Never true    Ran Out of Food in the Last Year: Never true  Transportation Needs: No Transportation Needs (01/11/2023)   PRAPARE -  Administrator, Civil Service (Medical): No    Lack of Transportation (Non-Medical): No  Physical Activity: Not on file  Stress: Not on file  Social Connections: Unknown (01/06/2023)   Received from [facility]   Social Network    Social Network: Not on file   Additional Social History:    Allergies:   Allergies  Allergen Reactions   Iron Dextran Shortness Of Breath and Anxiety   Aspirin Nausea And Vomiting and Other (See Comments)    Ok to take tylenol or ibuprofen    Penicillins Other (See Comments)    Unknown. Childhood reaction     Labs:  Results for orders placed or performed during the hospital encounter of 01/18/23 (from the past 48 hour(s))  CBC with Differential/Platelet     Status: Abnormal   Collection Time: 01/18/23 12:41 AM  Result Value Ref Range   WBC 6.6 4.0 - 10.5 K/uL   RBC 4.10 3.87 - 5.11 MIL/uL   Hemoglobin 9.6 (L) 12.0 - 15.0 g/dL   HCT 96.0 (L) 45.4 - 09.8 %   MCV 78.8 (L) 80.0 - 100.0 fL   MCH 23.4 (L) 26.0 - 34.0 pg   MCHC 29.7 (L) 30.0 - 36.0 g/dL   RDW 11.9 (H) 14.7 - 82.9 %   Platelets 218 150 - 400 K/uL   nRBC 0.0 0.0 - 0.2 %   Neutrophils Relative % 51 %   Neutro Abs 3.3 1.7 - 7.7 K/uL   Lymphocytes Relative 42 %   Lymphs Abs 2.8 0.7 - 4.0 K/uL   Monocytes Relative 5 %   Monocytes Absolute 0.3 0.1 - 1.0 K/uL   Eosinophils Relative 1 %   Eosinophils Absolute 0.1 0.0 - 0.5 K/uL   Basophils Relative 1 %   Basophils Absolute 0.0 0.0 - 0.1 K/uL   Immature Granulocytes 0 %   Abs Immature Granulocytes 0.01 0.00 - 0.07 K/uL    Comment: Performed at [facility], [address] [place], [place] [address]  Comprehensive metabolic panel     Status: Abnormal   Collection Time: 01/18/23 12:41 AM  Result Value Ref Range   Sodium 137 135 - 145 mmol/L   Potassium 3.8 3.5 - 5.1 mmol/L   Chloride 102 98 - 111 mmol/L  CO2 23 22 - 32 mmol/L   Glucose, Bld 111 (H) 70 - 99 mg/dL    Comment: Glucose reference range applies only to  samples taken after fasting for at least 8 hours.   BUN 11 6 - 20 mg/dL   Creatinine, Ser 0.98 0.44 - 1.00 mg/dL   Calcium 8.1 (L) 8.9 - 10.3 mg/dL   Total Protein 7.1 6.5 - 8.1 g/dL   Albumin 3.6 3.5 - 5.0 g/dL   AST 30 15 - 41 U/L   ALT 23 0 - 44 U/L   Alkaline Phosphatase 112 38 - 126 U/L   Total Bilirubin 0.4 0.3 - 1.2 mg/dL   GFR, Estimated >11 >91 mL/min    Comment: (NOTE) Calculated using the CKD-EPI Creatinine Equation (2021)    Anion gap 12 5 - 15    Comment: Performed at [facility], [address], [place], [place] [address]  Ethanol     Status: Abnormal   Collection Time: 01/18/23 12:41 AM  Result Value Ref Range   Alcohol, Ethyl (B) 353 (HH) <10 mg/dL    Comment: CRITICAL RESULT CALLED TO, READ BACK BY AND VERIFIED WITH K. BRANCH, EMTP. 0120 01/18/23. LPAIT (NOTE) Lowest detectable limit for serum alcohol is 10 mg/dL.  For medical purposes only. Performed at [facility], [address], [place], [place] [address]     Current Facility-Administered Medications  Medication Dose Route Frequency Provider Last Rate Last Admin   folic acid (FOLVITE) tablet 1 mg  1 mg Oral Daily Pollina, Canary Brim, MD       insulin aspart (novoLOG) injection 0-9 Units  0-9 Units Subcutaneous TID WC Pollina, Canary Brim, MD       LORazepam (ATIVAN) tablet 1-4 mg  1-4 mg Oral Q1H PRN Pollina, Canary Brim, MD       metFORMIN (GLUCOPHAGE) tablet 500 mg  500 mg Oral BID WC Pollina, Canary Brim, MD       multivitamin with minerals tablet 1 tablet  1 tablet Oral Daily Pollina, Canary Brim, MD       nicotine (NICODERM CQ - dosed in mg/24 hours) patch 21 mg  21 mg Transdermal Once Gilda Crease, MD   21 mg at 01/18/23 0033   thiamine (VITAMIN B1) tablet 100 mg  100 mg Oral Daily Pollina, Canary Brim, MD       Or   thiamine (VITAMIN B1) injection 100 mg  100 mg Intravenous Daily Pollina, Canary Brim, MD       Current Outpatient Medications  Medication Sig  Dispense Refill   albuterol (VENTOLIN HFA) 108 (90 Base) MCG/ACT inhaler Inhale 1-2 puffs into the lungs every 4 (four) hours as needed for wheezing or shortness of breath.     budesonide-formoterol (SYMBICORT) 160-4.5 MCG/ACT inhaler Inhale 2 puffs into the lungs 2 (two) times daily. (Patient not taking: Reported on 01/11/2023) 10.2 g 2   metFORMIN (GLUCOPHAGE-XR) 750 MG 24 hr tablet Take 750 mg by mouth daily. (Patient not taking: Reported on 01/11/2023)     OXYGEN Inhale 4-5 L into the lungs as needed (COPD).     zonisamide (ZONEGRAN) 100 MG capsule Take 2 capsules (200 mg total) by mouth daily. 30 capsule 3    Musculoskeletal: Strength & Muscle Tone: within normal limits Gait & Station: normal Patient leans: N/A   Psychiatric Specialty Exam: Presentation  General Appearance:  Other (comment); Appropriate for Environment (Mildly unkept)  Eye Contact: Fair  Speech: Clear and Coherent  Speech Volume: Normal  Handedness: Right   Mood and Affect  Mood: Euthymic  Affect: Other (comment) (Neutral if mildly constricted)   Thought Process  Thought Processes: Goal Directed; Linear; Coherent  Descriptions of Associations:Intact  Orientation:Full (Time, Place and Person)  Thought Content:Logical  History of Schizophrenia/Schizoaffective disorder:Yes  Duration of Psychotic Symptoms:Greater than six months  Hallucinations:Hallucinations: None  Ideas of Reference:None  Suicidal Thoughts:Suicidal Thoughts: No  Homicidal Thoughts:Homicidal Thoughts: No   Sensorium  Memory: Immediate Good; Recent Good; Remote Good  Judgment: Poor  Insight: Lacking   Executive Functions  Concentration: Fair  Attention Span: Fair  Recall: [employer] of Knowledge: Fair  Language: Fair   Psychomotor Activity  Psychomotor Activity: Psychomotor Activity: Normal   Assets  Assets: Communication Skills; Financial Resources/Insurance; Housing; Intimacy; Leisure  Time; Physical Health; Resilience; Social Support; Vocational/Educational; Transportation    Sleep  Sleep: Sleep: Good   Physical Exam: Physical Exam Vitals and nursing note reviewed.  Constitutional:      General: She is not in acute distress.    Appearance: Normal appearance. She is not ill-appearing, toxic-appearing or diaphoretic.  Pulmonary:     Effort: Pulmonary effort is normal.  Neurological:     Mental Status: She is alert and oriented to person, place, and time.  Psychiatric:        Attention and Perception: Attention and perception normal. She is attentive. She does not perceive auditory or visual hallucinations.        Mood and Affect: Mood and affect normal.        Speech: Speech normal.        Behavior: Behavior normal. Behavior is not agitated or aggressive. Behavior is cooperative.        Thought Content: Thought content is not paranoid or delusional. Thought content does not include homicidal or suicidal ideation.        Cognition and Memory: Cognition and memory normal.    Review of Systems  Psychiatric/Behavioral:  Positive for substance abuse (ETOH). Negative for depression, hallucinations and suicidal ideas. The patient is not nervous/anxious and does not have insomnia.   All other systems reviewed and are negative.  Blood pressure 131/78, pulse 100, temperature 97.8 F (36.6 C), resp. rate 17, height 5\' 2"  (1.575 m), weight 49.9 kg, last menstrual period 01/08/2011, SpO2 98%. Body mass index is 20.12 kg/m.  Medical Decision Making:  Patient presents this encounter, like previous encounters recently, with suicidal ideations in the context of severe EtOH use disorder, resulting in being involuntarily committed and brought to the hospital for safety.  Patient upon evaluation is appreciably sober, presents with no concerns for being intoxicated and unable to care for herself, as well as presents with no imminent risk to herself in the context of endorsements of  suicidal or homicidal ideations or presenting with psychotic features, thus will recommend psychiatric clearance at this time and subsequent discharge back home by way of safe transport and/or by family picking the patient up.  Patient is not interested in pharmacological intervention for EtOH use disorder and/or for her mood, is not interested in substance abuse resources provided, and minimizes her EtOH use overall, states "I am just not ready for it".  Recommendations-psychiatrically cleared   -Recommend discontinue IVC at this time -Recommend close outpatient follow-up with mental health services at [facility] and substance abuse treatment, if/when the patient is amenable -Recommended pharmacological intervention for EtOH use disorder and mood, but patient declined -Daughter reports she will have patient picked up  Safety Plan [name] will reach out to Daughter, Ms. [name], call 911 or call mobile crisis, or go to nearest emergency room if condition worsens or if suicidal thoughts become active Patients' will follow up with [facility]/substance abuse resources given for outpatient psychiatric services (therapy/medication management/substance abuse), if/when she is amenable  The suicide prevention education provided includes the following: Suicide risk factors Suicide prevention and interventions National Suicide Hotline telephone number [facility] assessment telephone number [facility] Emergency Assistance 911 [facility] and/or Residential Mobile Crisis Unit telephone number Request made of family/significant other to:  Daughter, Ms. [name] Remove weapons (e.g., guns, rifles, knives), all items previously/currently identified as safety concern.   Remove drugs/medications (over the counter, prescriptions, illicit drugs), all items previously/currently identified as a safety concern.    Disposition: No evidence of imminent risk to self or others at present.   Patient  does not meet criteria for psychiatric inpatient admission. Supportive therapy provided about ongoing stressors. Discussed crisis plan, support from social network, calling 911, coming to the Emergency Department, and calling Suicide Hotline.  Lenox Ponds, NP 01/18/2023 8:30 AM

## 2023-01-18 NOTE — ED Notes (Signed)
Staffing called, no sitters available

## 2023-01-18 NOTE — ED Notes (Signed)
Pt transferred to purple, dressed out and belongings in bag at bedside.

## 2023-01-18 NOTE — ED Notes (Signed)
Pt unwilling to do oral temp at this time

## 2023-01-18 NOTE — ED Provider Notes (Signed)
[place] EMERGENCY DEPARTMENT AT [facility] Provider Note   CSN: [id number] Arrival date & time: 01/18/23  0008     History  No chief complaint on file.   [name] is a 57 y.o. female.  Brought to the emergency department by police under IVC.  IVC initiated by patient's daughter.  Patient reportedly threatening to kill herself.  At arrival she is agitated, irate and screaming.       Home Medications Prior to Admission medications   Medication Sig Start Date End Date Taking? Authorizing Provider  albuterol (VENTOLIN HFA) 108 (90 Base) MCG/ACT inhaler Inhale 1-2 puffs into the lungs every 4 (four) hours as needed for wheezing or shortness of breath. 12/24/22   [provider]  budesonide-formoterol (SYMBICORT) 160-4.5 MCG/ACT inhaler Inhale 2 puffs into the lungs 2 (two) times daily. Patient not taking: Reported on 01/11/2023 12/01/22   Maretta Bees, MD  metFORMIN (GLUCOPHAGE-XR) 750 MG 24 hr tablet Take 750 mg by mouth daily. Patient not taking: Reported on 01/11/2023 12/30/22   [provider]  OXYGEN Inhale 4-5 L into the lungs as needed (COPD).    [provider]  zonisamide (ZONEGRAN) 100 MG capsule Take 2 capsules (200 mg total) by mouth daily. 01/12/23   Lewie Chamber, MD      Allergies    Iron dextran, Aspirin, and Penicillins    Review of Systems   Review of Systems  Physical Exam Updated Vital Signs LMP 01/08/2011  Physical Exam Vitals and nursing note reviewed.  Constitutional:      General: She is not in acute distress.    Appearance: She is well-developed.  HENT:     Head: Normocephalic and atraumatic.     Mouth/Throat:     Mouth: Mucous membranes are moist.  Eyes:     General: Vision grossly intact. Gaze aligned appropriately.     Extraocular Movements: Extraocular movements intact.     Conjunctiva/sclera: Conjunctivae normal.  Cardiovascular:     Rate and Rhythm: Normal rate and regular rhythm.      Pulses: Normal pulses.     Heart sounds: Normal heart sounds, S1 normal and S2 normal. No murmur heard.    No friction rub. No gallop.  Pulmonary:     Effort: Pulmonary effort is normal. No respiratory distress.     Breath sounds: Normal breath sounds.  Abdominal:     General: Bowel sounds are normal.     Palpations: Abdomen is soft.     Tenderness: There is no abdominal tenderness. There is no guarding or rebound.     Hernia: No hernia is present.  Musculoskeletal:        General: No swelling.     Cervical back: Full passive range of motion without pain, normal range of motion and neck supple. No spinous process tenderness or muscular tenderness. Normal range of motion.     Right lower leg: No edema.     Left lower leg: No edema.  Skin:    General: Skin is warm and dry.     Capillary Refill: Capillary refill takes less than 2 seconds.     Findings: No ecchymosis, erythema, rash or wound.  Neurological:     General: No focal deficit present.     Mental Status: She is alert and oriented to person, place, and time.     GCS: GCS eye subscore is 4. GCS verbal subscore is 5. GCS motor subscore is 6.     Cranial  Nerves: Cranial nerves 2-12 are intact.     Sensory: Sensation is intact.     Motor: Motor function is intact.     Coordination: Coordination is intact.  Psychiatric:        Attention and Perception: Attention normal.        Mood and Affect: Mood normal. Affect is angry.        Speech: Speech is slurred.        Behavior: Behavior is agitated.     ED Results / Procedures / Treatments   Labs (all labs ordered are listed, but only abnormal results are displayed) Labs Reviewed - No data to display  EKG None  Radiology No results found.  Procedures Procedures    Medications Ordered in ED Medications - No data to display  ED Course/ Medical Decision Making/ A&P                             Medical Decision Making Amount and/or Complexity of Data Reviewed Labs:  ordered.  Risk OTC drugs. Prescription drug management.     Differential diagnosis considered includes, but not limited to: Intoxication; psychiatric illness  Brought to the emergency department under involuntary commitment.  Patient angry, agitated, somewhat hostile.  Chart review reveals history of COPD, substance-induced mood disorder with polysubstance drug abuse including crack cocaine and alcohol, schizoaffective disorder bipolar type.   Patient required sedation secondary to her agitation.  She has been sleeping quietly through the night.  Medical workup unrevealing, medically clear for TTS evaluation when she is awake and alert.        Final Clinical Impression(s) / ED Diagnoses Final diagnoses:  Involuntary commitment    Rx / DC Orders ED Discharge Orders     None         Grayer Sproles, Canary Brim, MD 01/18/23 [phone]

## 2023-01-18 NOTE — ED Provider Notes (Signed)
  Physical Exam  BP 131/78   Pulse 100   Temp 97.8 F (36.6 C)   Resp 17   Ht 1.575 m (5\' 2" )   Wt 49.9 kg   LMP 01/08/2011   SpO2 98%   BMI 20.12 kg/m   Physical Exam  Procedures  Procedures  ED Course / MDM    Medical Decision Making Amount and/or Complexity of Data Reviewed Labs: ordered.  Risk OTC drugs. Prescription drug management.   Handoff from Dr. Blinda Leatherwood- patient IVC'd by family, required haldol here for agitation.  Work up c.w. acute etoh intoxication with etoh 353. CBC with stable anemia CMET stable  Will reevaluate when etoh metabolized Psych has seen and cleared patient. Medically she appears stable for discharge        Margarita Grizzle, MD 01/18/23 [phone]

## 2023-01-18 NOTE — Progress Notes (Signed)
TOC CSW received a consult for substance use resources. Resources have been attached to pt's AVS.TOC sign off.   Valentina Shaggy.Margaret Staggs, MSW, LCSWA [facility]  Transitions of Care Clinical Social Worker I Direct Dial: [phone]  Fax: [phone] [email or web address]

## 2023-01-18 NOTE — ED Notes (Signed)
Daughter aware of pt's discharge to home. Daughter states to calling uber for pt. Pt provided with cup of water per request. Voiced to pt to wait outside in front of ED lobby for uber transportation. Pt voiced understanding, noted ambulating to ED lobby, steady gait noted.

## 2023-01-18 NOTE — ED Notes (Signed)
IVC/ first exam sent: envelope # [id number]

## 2023-01-18 NOTE — ED Triage Notes (Signed)
Pt was IVC'd by daughter due to making verbal threats on the phone speaking of killing her self during a conversation about getting alchol picked up. Pt cae in police custody with a signed IVC. She is uncooperative at this time for triage with verbal threats, loud demands, and unwillingly to answer questions in a calm manner. Pt is handcuffed to the bed at this time.

## 2023-01-18 NOTE — ED Notes (Signed)
Rescind paperwork under : Envelope #[id number]

## 2023-01-20 ENCOUNTER — Other Ambulatory Visit: Payer: Self-pay

## 2023-01-20 ENCOUNTER — Encounter (HOSPITAL_COMMUNITY): Payer: Self-pay | Admitting: Emergency Medicine

## 2023-01-20 ENCOUNTER — Emergency Department (HOSPITAL_COMMUNITY)
Admission: EM | Admit: 2023-01-20 | Discharge: 2023-01-20 | Disposition: A | Discharge status: Home / Self Care | Payer: 59 | Attending: Emergency Medicine | Admitting: Emergency Medicine

## 2023-01-20 DIAGNOSIS — M549 Dorsalgia, unspecified: Secondary | ICD-10-CM | POA: Diagnosis not present

## 2023-01-20 DIAGNOSIS — Z7951 Long term (current) use of inhaled steroids: Secondary | ICD-10-CM | POA: Diagnosis not present

## 2023-01-20 DIAGNOSIS — F1490 Cocaine use, unspecified, uncomplicated: Secondary | ICD-10-CM | POA: Insufficient documentation

## 2023-01-20 DIAGNOSIS — I10 Essential (primary) hypertension: Secondary | ICD-10-CM | POA: Insufficient documentation

## 2023-01-20 DIAGNOSIS — J449 Chronic obstructive pulmonary disease, unspecified: Secondary | ICD-10-CM | POA: Diagnosis not present

## 2023-01-20 DIAGNOSIS — R1013 Epigastric pain: Secondary | ICD-10-CM | POA: Insufficient documentation

## 2023-01-20 DIAGNOSIS — R000 Tachycardia, unspecified: Secondary | ICD-10-CM | POA: Insufficient documentation

## 2023-01-20 DIAGNOSIS — Z79899 Other long term (current) drug therapy: Secondary | ICD-10-CM | POA: Diagnosis not present

## 2023-01-20 DIAGNOSIS — Z7984 Long term (current) use of oral hypoglycemic drugs: Secondary | ICD-10-CM | POA: Diagnosis not present

## 2023-01-20 DIAGNOSIS — E119 Type 2 diabetes mellitus without complications: Secondary | ICD-10-CM | POA: Insufficient documentation

## 2023-01-20 DIAGNOSIS — R6889 Other general symptoms and signs: Secondary | ICD-10-CM | POA: Diagnosis not present

## 2023-01-20 DIAGNOSIS — I499 Cardiac arrhythmia, unspecified: Secondary | ICD-10-CM | POA: Diagnosis not present

## 2023-01-20 DIAGNOSIS — Z743 Need for continuous supervision: Secondary | ICD-10-CM | POA: Diagnosis not present

## 2023-01-20 DIAGNOSIS — R109 Unspecified abdominal pain: Secondary | ICD-10-CM | POA: Diagnosis not present

## 2023-01-20 LAB — COMPREHENSIVE METABOLIC PANEL
ALT: 41 U/L (ref 0–44)
AST: 66 U/L — ABNORMAL HIGH (ref 15–41)
Albumin: 4 g/dL (ref 3.5–5.0)
Alkaline Phosphatase: 128 U/L — ABNORMAL HIGH (ref 38–126)
Anion gap: 16 — ABNORMAL HIGH (ref 5–15)
BUN: 5 mg/dL — ABNORMAL LOW (ref 6–20)
CO2: 19 mmol/L — ABNORMAL LOW (ref 22–32)
Calcium: 9.1 mg/dL (ref 8.9–10.3)
Chloride: 100 mmol/L (ref 98–111)
Creatinine, Ser: 0.61 mg/dL (ref 0.44–1.00)
GFR, Estimated: >60 mL/min (ref >60)
Glucose, Bld: 135 mg/dL — ABNORMAL HIGH (ref 70–99)
Potassium: 3.9 mmol/L (ref 3.5–5.1)
Sodium: 135 mmol/L (ref 135–145)
Total Bilirubin: 1.3 mg/dL — ABNORMAL HIGH (ref 0.3–1.2)
Total Protein: 7.2 g/dL (ref 6.5–8.1)

## 2023-01-20 LAB — URINALYSIS, ROUTINE W REFLEX MICROSCOPIC
Bilirubin Urine: NEGATIVE
Glucose, UA: NEGATIVE mg/dL
Hgb urine dipstick: NEGATIVE
Ketones, ur: NEGATIVE mg/dL
Leukocytes,Ua: NEGATIVE
Nitrite: NEGATIVE
Protein, ur: NEGATIVE mg/dL
Specific Gravity, Urine: 1.004 — ABNORMAL LOW (ref 1.005–1.030)
pH: 5 (ref 5.0–8.0)

## 2023-01-20 LAB — CBC
HCT: 28.9 % — ABNORMAL LOW (ref 36.0–46.0)
Hemoglobin: 8.6 g/dL — ABNORMAL LOW (ref 12.0–15.0)
MCH: 23.5 pg — ABNORMAL LOW (ref 26.0–34.0)
MCHC: 29.8 g/dL — ABNORMAL LOW (ref 30.0–36.0)
MCV: 79 fL — ABNORMAL LOW (ref 80.0–100.0)
Platelets: 176 10*3/uL (ref 150–400)
RBC: 3.66 MIL/uL — ABNORMAL LOW (ref 3.87–5.11)
RDW: 20.4 % — ABNORMAL HIGH (ref 11.5–15.5)
WBC: 6.4 10*3/uL (ref 4.0–10.5)
nRBC: 0 % (ref 0.0–0.2)

## 2023-01-20 LAB — ACETAMINOPHEN LEVEL: Acetaminophen (Tylenol), Serum: <10 ug/mL — ABNORMAL LOW (ref 10–30)

## 2023-01-20 LAB — ETHANOL: Alcohol, Ethyl (B): <10 mg/dL (ref <10)

## 2023-01-20 LAB — RAPID URINE DRUG SCREEN, HOSP PERFORMED
Amphetamines: NOT DETECTED
Barbiturates: NOT DETECTED
Benzodiazepines: POSITIVE — AB
Cocaine: POSITIVE — AB
Opiates: NOT DETECTED
Tetrahydrocannabinol: NOT DETECTED

## 2023-01-20 LAB — LIPASE, BLOOD: Lipase: 41 U/L (ref 11–51)

## 2023-01-20 LAB — SALICYLATE LEVEL: Salicylate Lvl: <7 mg/dL — ABNORMAL LOW (ref 7.0–30.0)

## 2023-01-20 MED ORDER — DROPERIDOL 2.5 MG/ML IJ SOLN
2.5000 mg | Freq: Once | INTRAMUSCULAR | Status: AC
  Administered 2023-01-20: 2.5 mg via INTRAVENOUS
  Filled 2023-01-20: qty 2

## 2023-01-20 MED ORDER — ONDANSETRON HCL 4 MG PO TABS: 4.0000 mg | ORAL_TABLET | Freq: Four times a day (QID) | ORAL | 0 refills | Status: DC

## 2023-01-20 MED ORDER — SODIUM CHLORIDE 0.9 % IV BOLUS
1000.0000 mL | Freq: Once | INTRAVENOUS | Status: AC
  Administered 2023-01-20: 1000 mL via INTRAVENOUS

## 2023-01-20 NOTE — ED Notes (Signed)
Pt taken out of restraints.

## 2023-01-20 NOTE — ED Provider Notes (Signed)
[place] EMERGENCY DEPARTMENT AT [facility] Provider Note   CSN: [id number] Arrival date & time: 01/20/23  0306     History  Chief Complaint  Patient presents with   Abdominal Pain    [name] is a 57 y.o. female with past medical history significant for schizoaffective disorder, alcohol use disorder, COPD, diabetes, hypertension, cocaine use, history of SI, HI presents with concern for abdominal pain, nausea, vomiting, back pain starting last night.  He endorses pain 10/10.  She denies any hematemesis, hematochezia, melena.  She has not tried anything for the pain other than ibuprofen which she reports no improvement with.  She reports recent alcohol and crack use.  She is post to be on 4 to 5 L nasal cannula for COPD but was stable oxygen saturation on room air on EMS arrival.   Abdominal Pain      Home Medications Prior to Admission medications   Medication Sig Start Date End Date Taking? Authorizing Provider  albuterol (VENTOLIN HFA) 108 (90 Base) MCG/ACT inhaler Inhale 1-2 puffs into the lungs every 4 (four) hours as needed for wheezing or shortness of breath. 12/24/22   [provider]  budesonide-formoterol (SYMBICORT) 160-4.5 MCG/ACT inhaler Inhale 2 puffs into the lungs 2 (two) times daily. Patient not taking: Reported on 01/11/2023 12/01/22   Maretta Bees, MD  metFORMIN (GLUCOPHAGE-XR) 750 MG 24 hr tablet Take 750 mg by mouth daily. Patient not taking: Reported on 01/11/2023 12/30/22   [provider]  ondansetron (ZOFRAN) 4 MG tablet Take 1 tablet (4 mg total) by mouth every 6 (six) hours. 01/20/23  Yes Daiveon Markman H, PA-C  OXYGEN Inhale 4-5 L into the lungs as needed (COPD).    [provider]  zonisamide (ZONEGRAN) 100 MG capsule Take 2 capsules (200 mg total) by mouth daily. 01/12/23   Lewie Chamber, MD      Allergies    Iron dextran, Aspirin, and Penicillins    Review of Systems   Review of Systems   Gastrointestinal:  Positive for abdominal pain.  All other systems reviewed and are negative.   Physical Exam Updated Vital Signs BP 117/73   Pulse (!) 105   Temp 98.5 F (36.9 C) (Oral)   Resp 17   Ht 5\' 2"  (1.575 m)   Wt 49.9 kg   LMP 01/08/2011   SpO2 96%   BMI 20.12 kg/m  Physical Exam Vitals and nursing note reviewed.  Constitutional:      General: She is not in acute distress.    Appearance: Normal appearance.  HENT:     Head: Normocephalic and atraumatic.  Eyes:     General:        Right eye: No discharge.        Left eye: No discharge.  Cardiovascular:     Rate and Rhythm: Regular rhythm. Tachycardia present.     Heart sounds: No murmur heard.    No friction rub. No gallop.  Pulmonary:     Effort: Pulmonary effort is normal.     Breath sounds: Normal breath sounds.  Abdominal:     General: Bowel sounds are normal.     Palpations: Abdomen is soft.     Comments: Diffuse tenderness to palpation throughout the abdomen, no rebound, rigidity, guarding  Skin:    General: Skin is warm and dry.     Capillary Refill: Capillary refill takes less than 2 seconds.  Neurological:     Mental Status: She is  alert and oriented to person, place, and time.  Psychiatric:     Comments: Anxious behavior     ED Results / Procedures / Treatments   Labs (all labs ordered are listed, but only abnormal results are displayed) Labs Reviewed  COMPREHENSIVE METABOLIC PANEL - Abnormal; Notable for the following components:      Result Value   CO2 19 (*)    Glucose, Bld 135 (*)    BUN 5 (*)    AST 66 (*)    Alkaline Phosphatase 128 (*)    Total Bilirubin 1.3 (*)    Anion gap 16 (*)    All other components within normal limits  URINALYSIS, ROUTINE W REFLEX MICROSCOPIC - Abnormal; Notable for the following components:   Specific Gravity, Urine 1.004 (*)    All other components within normal limits  RAPID URINE DRUG SCREEN, HOSP PERFORMED - Abnormal; Notable for the following  components:   Cocaine POSITIVE (*)    Benzodiazepines POSITIVE (*)    All other components within normal limits  ACETAMINOPHEN LEVEL - Abnormal; Notable for the following components:   Acetaminophen (Tylenol), Serum <10 (*)    All other components within normal limits  SALICYLATE LEVEL - Abnormal; Notable for the following components:   Salicylate Lvl <7.0 (*)    All other components within normal limits  CBC - Abnormal; Notable for the following components:   RBC 3.66 (*)    Hemoglobin 8.6 (*)    HCT 28.9 (*)    MCV 79.0 (*)    MCH 23.5 (*)    MCHC 29.8 (*)    RDW 20.4 (*)    All other components within normal limits  LIPASE, BLOOD  ETHANOL    EKG EKG Interpretation Date/Time:  Tuesday January 20 2023 03:11:29 EDT Ventricular Rate:  116 PR Interval:  136 QRS Duration:  88 QT Interval:  320 QTC Calculation: 445 R Axis:   38  Text Interpretation: Sinus tachycardia Probable left atrial enlargement Confirmed by Tilden Fossa [phone]) on 01/20/2023 3:14:43 AM  Radiology No results found.  Procedures Procedures    Medications Ordered in ED Medications  droperidol (INAPSINE) 2.5 MG/ML injection 2.5 mg (2.5 mg Intravenous Given 01/20/23 0343)  sodium chloride 0.9 % bolus 1,000 mL (0 mLs Intravenous Stopped 01/20/23 6045)    ED Course/ Medical Decision Making/ A&P                             Medical Decision Making Amount and/or Complexity of Data Reviewed Labs: ordered.  Risk Prescription drug management.   This patient is a 57 y.o. female  who presents to the ED for concern of abdominal pain, nausea, vomiting, back pain.   Differential diagnoses prior to evaluation: The emergent differential diagnosis includes, but is not limited to,  The causes of generalized abdominal pain include but are not limited to AAA, mesenteric ischemia, appendicitis, diverticulitis, DKA, gastritis, gastroenteritis, AMI, nephrolithiasis, pancreatitis, peritonitis, adrenal  insufficiency,lead poisoning, iron toxicity, intestinal ischemia, constipation, UTI,SBO/LBO, splenic rupture, biliary disease, IBD, IBS, PUD, or hepatitis. This is not an exhaustive differential.   Past Medical History / Co-morbidities / Social History: schizoaffective disorder, alcohol use disorder, COPD, diabetes, hypertension, cocaine use, history of SI, HI   Additional history: Chart reviewed. Pertinent results include: Reviewed lab work, imaging from recent previous emergency department visits, patient has been here multiple times over the last month for suicidal ideations, homicidal ideations, alcohol withdrawal syndrome, and  seizures  Physical Exam: Physical exam performed. The pertinent findings include: Initially tachycardic, agitated on arrival, but with improvement on recheck of her tachycardia, and overall clinical appearance  Lab Tests/Imaging studies: I personally interpreted labs/imaging and the pertinent results include: CMP with mild bicarb deficit, CO2 19, with anion gap likely secondary to alcohol intoxication versus starvation ketoacidosis, ethanol is negative today although has been quite elevated recently per patient, normal acetaminophen, salicylate level.  Normal lipase.  UA overall unremarkable.  CBC was clotted and needs to be redrawn, repeat CBC notable for decreased hemoglobin, HGB 8.6 today, 9.6 from just a few days ago. Patient did have fluid bolus prior to CBC today due to blood clotting. She denies any melena, active bleeding in stool, or active menses at this time.  Cardiac monitoring: EKG obtained and interpreted by myself and attending physician which shows: sinus tachycardia   Medications: I ordered medication including droperidol, fluid bolus.  I have reviewed the patients home medicines and have made adjustments as needed.  Disposition: After consideration of the diagnostic results and the patients response to treatment, I feel that patient is stable for  discharge with plan to recheck hemoglobin as above .   emergency department workup does not suggest an emergent condition requiring admission or immediate intervention beyond what has been performed at this time. The plan is: as above, stable for discharge, pain, nausea, and tachycardia significantly improved, some lingering tachycardia but without hallucination, agitation or other signs of withdrawal, patient comfortable with plan for discharge. The patient is safe for discharge and has been instructed to return immediately for worsening symptoms, change in symptoms or any other concerns.   Final Clinical Impression(s) / ED Diagnoses Final diagnoses:  Epigastric pain  Cocaine use    Rx / DC Orders ED Discharge Orders          Ordered    ondansetron (ZOFRAN) 4 MG tablet  Every 6 hours        01/20/23 0627              [place] 01/20/23 4098    Tilden Fossa, MD 01/20/23 [phone]

## 2023-01-20 NOTE — Discharge Instructions (Signed)
Please follow-up with your primary care doctor for recheck of your hemoglobin, absent the nausea medication home with you.  Please return emergency department if you have worsening abdominal pain.

## 2023-01-20 NOTE — ED Triage Notes (Addendum)
Pt BIB from home with c/o abd pain, n/v, and back pain starting last night, 10/10; v/s en route 148/76, RR 22, HR 116, 97% RA, CBG 164

## 2023-01-22 ENCOUNTER — Emergency Department (HOSPITAL_COMMUNITY)
Admission: EM | Admit: 2023-01-22 | Discharge: 2023-01-22 | Disposition: A | Discharge status: Home / Self Care | Payer: 59 | Attending: Emergency Medicine | Admitting: Emergency Medicine

## 2023-01-22 ENCOUNTER — Emergency Department (HOSPITAL_COMMUNITY): Payer: 59

## 2023-01-22 ENCOUNTER — Encounter (HOSPITAL_COMMUNITY): Payer: Self-pay

## 2023-01-22 ENCOUNTER — Other Ambulatory Visit: Payer: Self-pay

## 2023-01-22 DIAGNOSIS — R1084 Generalized abdominal pain: Secondary | ICD-10-CM | POA: Diagnosis not present

## 2023-01-22 DIAGNOSIS — R197 Diarrhea, unspecified: Secondary | ICD-10-CM | POA: Diagnosis not present

## 2023-01-22 DIAGNOSIS — R630 Anorexia: Secondary | ICD-10-CM | POA: Insufficient documentation

## 2023-01-22 DIAGNOSIS — M549 Dorsalgia, unspecified: Secondary | ICD-10-CM | POA: Diagnosis not present

## 2023-01-22 DIAGNOSIS — R6889 Other general symptoms and signs: Secondary | ICD-10-CM | POA: Diagnosis not present

## 2023-01-22 DIAGNOSIS — Z743 Need for continuous supervision: Secondary | ICD-10-CM | POA: Diagnosis not present

## 2023-01-22 DIAGNOSIS — K4020 Bilateral inguinal hernia, without obstruction or gangrene, not specified as recurrent: Secondary | ICD-10-CM | POA: Diagnosis not present

## 2023-01-22 DIAGNOSIS — R000 Tachycardia, unspecified: Secondary | ICD-10-CM | POA: Diagnosis not present

## 2023-01-22 DIAGNOSIS — I499 Cardiac arrhythmia, unspecified: Secondary | ICD-10-CM | POA: Diagnosis not present

## 2023-01-22 DIAGNOSIS — R109 Unspecified abdominal pain: Secondary | ICD-10-CM | POA: Diagnosis not present

## 2023-01-22 LAB — URINALYSIS, ROUTINE W REFLEX MICROSCOPIC
Bilirubin Urine: NEGATIVE
Glucose, UA: NEGATIVE mg/dL
Hgb urine dipstick: NEGATIVE
Ketones, ur: NEGATIVE mg/dL
Leukocytes,Ua: NEGATIVE
Nitrite: NEGATIVE
Protein, ur: NEGATIVE mg/dL
Specific Gravity, Urine: 1.036 — ABNORMAL HIGH (ref 1.005–1.030)
pH: 5 (ref 5.0–8.0)

## 2023-01-22 LAB — COMPREHENSIVE METABOLIC PANEL
ALT: 43 U/L (ref 0–44)
AST: 68 U/L — ABNORMAL HIGH (ref 15–41)
Albumin: 3.4 g/dL — ABNORMAL LOW (ref 3.5–5.0)
Alkaline Phosphatase: 101 U/L (ref 38–126)
Anion gap: 11 (ref 5–15)
BUN: 6 mg/dL (ref 6–20)
CO2: 23 mmol/L (ref 22–32)
Calcium: 8.2 mg/dL — ABNORMAL LOW (ref 8.9–10.3)
Chloride: 103 mmol/L (ref 98–111)
Creatinine, Ser: 0.43 mg/dL — ABNORMAL LOW (ref 0.44–1.00)
GFR, Estimated: >60 mL/min (ref >60)
Glucose, Bld: 131 mg/dL — ABNORMAL HIGH (ref 70–99)
Potassium: 3.7 mmol/L (ref 3.5–5.1)
Sodium: 137 mmol/L (ref 135–145)
Total Bilirubin: 1 mg/dL (ref 0.3–1.2)
Total Protein: 6.6 g/dL (ref 6.5–8.1)

## 2023-01-22 LAB — I-STAT CHEM 8, ED
BUN: 5 mg/dL — ABNORMAL LOW (ref 6–20)
Calcium, Ion: 1.1 mmol/L — ABNORMAL LOW (ref 1.15–1.40)
Chloride: 105 mmol/L (ref 98–111)
Creatinine, Ser: 0.4 mg/dL — ABNORMAL LOW (ref 0.44–1.00)
Glucose, Bld: 132 mg/dL — ABNORMAL HIGH (ref 70–99)
HCT: 34 % — ABNORMAL LOW (ref 36.0–46.0)
Hemoglobin: 11.6 g/dL — ABNORMAL LOW (ref 12.0–15.0)
Potassium: 4.1 mmol/L (ref 3.5–5.1)
Sodium: 139 mmol/L (ref 135–145)
TCO2: 24 mmol/L (ref 22–32)

## 2023-01-22 LAB — CBC
HCT: 29.6 % — ABNORMAL LOW (ref 36.0–46.0)
Hemoglobin: 9 g/dL — ABNORMAL LOW (ref 12.0–15.0)
MCH: 24.6 pg — ABNORMAL LOW (ref 26.0–34.0)
MCHC: 30.4 g/dL (ref 30.0–36.0)
MCV: 80.9 fL (ref 80.0–100.0)
Platelets: 229 10*3/uL (ref 150–400)
RBC: 3.66 MIL/uL — ABNORMAL LOW (ref 3.87–5.11)
RDW: 20.5 % — ABNORMAL HIGH (ref 11.5–15.5)
WBC: 9.8 10*3/uL (ref 4.0–10.5)
nRBC: 0 % (ref 0.0–0.2)

## 2023-01-22 LAB — LIPASE, BLOOD: Lipase: 55 U/L — ABNORMAL HIGH (ref 11–51)

## 2023-01-22 MED ORDER — IOHEXOL 350 MG/ML SOLN
75.0000 mL | Freq: Once | INTRAVENOUS | Status: AC | PRN
  Administered 2023-01-22: 75 mL via INTRAVENOUS

## 2023-01-22 MED ORDER — ONDANSETRON HCL 4 MG/2ML IJ SOLN
4.0000 mg | Freq: Once | INTRAMUSCULAR | Status: AC | PRN
  Administered 2023-01-22: 4 mg via INTRAVENOUS
  Filled 2023-01-22: qty 2

## 2023-01-22 MED ORDER — FENTANYL CITRATE PF 50 MCG/ML IJ SOSY
50.0000 ug | PREFILLED_SYRINGE | Freq: Once | INTRAMUSCULAR | Status: AC
  Administered 2023-01-22: 50 ug via INTRAVENOUS
  Filled 2023-01-22: qty 1

## 2023-01-22 NOTE — Discharge Instructions (Addendum)
You may have some mild pancreatitis.  No definite cause of your abdominal pain was found today. Please drink clear liquids and gradually and gently increase your diet. Return if you are having worsening abdominal pain Follow-up with your primary care doctor if your pain has not resolved in 1 to 2 days

## 2023-01-22 NOTE — ED Notes (Signed)
Attempted twice now for an IV. Patient screams and tenses up at bedside.

## 2023-01-22 NOTE — ED Provider Notes (Signed)
[place] EMERGENCY DEPARTMENT AT [facility] Provider Note   CSN: [id number] Arrival date & time: 01/22/23  4098     History  Chief Complaint  Patient presents with   Abdominal Pain    [name] is a 57 y.o. female.  HPI 57 yo female presents complaining of abdominal pain and backpain.  Decreased po intake since pain worsened. Seen yesterday for same.      Home Medications Prior to Admission medications   Medication Sig Start Date End Date Taking? Authorizing Provider  albuterol (VENTOLIN HFA) 108 (90 Base) MCG/ACT inhaler Inhale 1-2 puffs into the lungs every 4 (four) hours as needed for wheezing or shortness of breath. 12/24/22   [provider]  budesonide-formoterol (SYMBICORT) 160-4.5 MCG/ACT inhaler Inhale 2 puffs into the lungs 2 (two) times daily. Patient not taking: Reported on 01/11/2023 12/01/22   Maretta Bees, MD  metFORMIN (GLUCOPHAGE-XR) 750 MG 24 hr tablet Take 750 mg by mouth daily. Patient not taking: Reported on 01/11/2023 12/30/22   [provider]  ondansetron (ZOFRAN) 4 MG tablet Take 1 tablet (4 mg total) by mouth every 6 (six) hours. 01/20/23   Prosperi, Christian H, PA-C  OXYGEN Inhale 4-5 L into the lungs as needed (COPD).    [provider]  zonisamide (ZONEGRAN) 100 MG capsule Take 2 capsules (200 mg total) by mouth daily. 01/12/23   Lewie Chamber, MD      Allergies    Iron dextran, Aspirin, and Penicillins    Review of Systems   Review of Systems  Physical Exam Updated Vital Signs BP 128/65 (BP Location: Left Arm)   Pulse 88   Temp 98.7 F (37.1 C) (Oral)   Resp 16   Ht 1.575 m (5\' 2" )   LMP 01/08/2011   SpO2 94%   BMI 20.12 kg/m  Physical Exam Vitals reviewed.  Constitutional:      General: She is not in acute distress.    Appearance: She is well-developed.  HENT:     Head: Normocephalic.     Mouth/Throat:     Mouth: Mucous membranes are moist.  Eyes:     Extraocular Movements:  Extraocular movements intact.  Cardiovascular:     Rate and Rhythm: Normal rate and regular rhythm.  Pulmonary:     Effort: Pulmonary effort is normal.     Breath sounds: Normal breath sounds.  Abdominal:     General: Abdomen is flat. Bowel sounds are normal.     Palpations: Abdomen is soft.     Tenderness: There is abdominal tenderness.     Comments: Mild diffuse tenderness to palpation  Skin:    General: Skin is warm and dry.  Neurological:     Mental Status: She is alert.     ED Results / Procedures / Treatments   Labs (all labs ordered are listed, but only abnormal results are displayed) Labs Reviewed  LIPASE, BLOOD - Abnormal; Notable for the following components:      Result Value   Lipase 55 (*)    All other components within normal limits  COMPREHENSIVE METABOLIC PANEL - Abnormal; Notable for the following components:   Glucose, Bld 131 (*)    Creatinine, Ser 0.43 (*)    Calcium 8.2 (*)    Albumin 3.4 (*)    AST 68 (*)    All other components within normal limits  CBC - Abnormal; Notable for the following components:   RBC 3.66 (*)    Hemoglobin  9.0 (*)    HCT 29.6 (*)    MCH 24.6 (*)    RDW 20.5 (*)    All other components within normal limits  I-STAT CHEM 8, ED - Abnormal; Notable for the following components:   BUN 5 (*)    Creatinine, Ser 0.40 (*)    Glucose, Bld 132 (*)    Calcium, Ion 1.10 (*)    Hemoglobin 11.6 (*)    HCT 34.0 (*)    All other components within normal limits  URINALYSIS, ROUTINE W REFLEX MICROSCOPIC    EKG None  Radiology CT ABDOMEN PELVIS W CONTRAST  Result Date: 01/22/2023 CLINICAL DATA:  Abdominal pain, acute, nonlocalized EXAM: CT ABDOMEN AND PELVIS WITH CONTRAST TECHNIQUE: Multidetector CT imaging of the abdomen and pelvis was performed using the standard protocol following bolus administration of intravenous contrast. RADIATION DOSE REDUCTION: This exam was performed according to the departmental dose-optimization program  which includes automated exposure control, adjustment of the mA and/or kV according to patient size and/or use of iterative reconstruction technique. CONTRAST:  75mL OMNIPAQUE IOHEXOL 350 MG/ML SOLN COMPARISON:  CT scan abdomen and pelvis from 01/05/2023. FINDINGS: Lower chest: The lung bases are clear. No pleural effusion. The heart is normal in size. No pericardial effusion. There are few prominent venous collaterals in the inferior posterior midline pericardial region, nonspecific. Hepatobiliary: The liver is normal in size. Non-cirrhotic configuration. No suspicious mass. No intrahepatic or extrahepatic bile duct dilation. No calcified gallstones. Normal gallbladder wall thickness. No pericholecystic inflammatory changes. Pancreas: Unremarkable. No pancreatic ductal dilatation or surrounding inflammatory changes. Spleen: Within normal limits. No focal lesion. Adrenals/Urinary Tract: Adrenal glands are unremarkable. No suspicious renal mass. No hydronephrosis. No renal or ureteric calculi. PLEASE NOTE THAT THE EVALUATION OF PELVIC VISCERA IS MILDLY LIMITED DUE TO STREAK ARTIFACTS FROM RIGHT HIP PROSTHESIS. Nondiagnostic evaluation of urinary bladder due to extensive streak artifacts. 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 No disproportionate dilation of the small or large bowel loops. No evidence of abnormal bowel wall thickening or inflammatory changes. Unremarkable appendix. Vascular/Lymphatic: No ascites or pneumoperitoneum. No abdominal or pelvic lymphadenopathy, by size criteria. No aneurysmal dilation of the major abdominal arteries. There are mild peripheral atherosclerotic vascular calcifications of the aorta and its major branches. Reproductive: Not well evaluated due to extensive streak artifacts. Surgically absent uterus. Other: Midline surgical scar noted. There are tiny bilateral fat containing inguinal hernias.  Musculoskeletal: No suspicious osseous lesions. There are mild - moderate multilevel degenerative changes in the visualized spine. There is age indeterminate moderate anterior wedging deformity of T9 vertebral body. No significant retropulsion or spinal canal compromise. Note is made of right hip arthroplasty. IMPRESSION: 1. No acute inflammatory process identified within the abdomen or pelvis. 2. 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 No evidence of bowel obstruction. 3. Age indeterminate moderate anterior wedging deformity of T9 vertebral body. No significant retropulsion or spinal canal compromise. Aortic Atherosclerosis (ICD10-I70.0). Electronically Signed   By: Jules Schick M.D.   On: 01/22/2023 11:06    Procedures Procedures    Medications Ordered in ED Medications  ondansetron (ZOFRAN) injection 4 mg (4 mg Intravenous Given 01/22/23 0813)  fentaNYL (SUBLIMAZE) injection 50 mcg (50 mcg Intravenous Given 01/22/23 0812)  iohexol (OMNIPAQUE) 350 MG/ML injection 75 mL (75 mLs Intravenous Contrast Given 01/22/23 1044)    ED Course/ Medical Decision Making/ A&P Clinical Course as of 01/22/23 1123  Thu Jan 22, 2023  1040 I-STAT reviewed interpreted with normal BUN/creatinine and electrolytes Mild anemia hemoglobin of 11.6 [DR]  1118 Labs reviewed interpreted significant for mildly elevated lipase at 55, electrolytes remain normal with normal BUN and creatinine slightly elevated AST CBC reviewed and interpreted reveals normal white blood cell count [DR]  1119 Mild stable anemia [DR]  1120 CT reviewed interpreted no evidence of acute abnormalities noted there postsurgical changes from gastric bypass with no acute inflammatory process noted  [DR]    Clinical Course User Index [DR] Margarita Grizzle, MD                                 Medical Decision Making Amount and/or Complexity of Data Reviewed Labs: ordered. Radiology:  ordered.  Risk Prescription drug management.   58 year old female presents today complaining of diffuse crampy abdominal pain with nausea but no active vomiting Differential diagnosis includes was not limited to gastritis, pancreatitis, small bowel obstruction, appendicitis, diseases of the liver and gallbladder, not on GI etiologies including DKA, pneumonia Patient evaluated here with labs and imaging.  Her lipase is slightly elevated No evidence of acute abnormalities noted in the abdomen Patient is taking p.o. well without pain. We have discussed return precautions and need for follow-up and she voices understanding.        Final Clinical Impression(s) / ED Diagnoses Final diagnoses:  Generalized abdominal pain    Rx / DC Orders ED Discharge Orders     None         Margarita Grizzle, MD 01/22/23 1123

## 2023-01-22 NOTE — ED Triage Notes (Signed)
Patient arrives via EMS for abdominal pain. States she was seen here recently for the same thing. Patient endorsees using crack yesterday. On scene patient was anxious with EMS.

## 2023-01-23 ENCOUNTER — Encounter (HOSPITAL_COMMUNITY): Payer: Self-pay

## 2023-01-23 ENCOUNTER — Other Ambulatory Visit: Payer: Self-pay

## 2023-01-23 ENCOUNTER — Emergency Department (HOSPITAL_COMMUNITY)
Admission: EM | Admit: 2023-01-23 | Discharge: 2023-01-23 | Disposition: A | Discharge status: Home / Self Care | Payer: 59 | Attending: Emergency Medicine | Admitting: Emergency Medicine

## 2023-01-23 DIAGNOSIS — R109 Unspecified abdominal pain: Secondary | ICD-10-CM | POA: Diagnosis not present

## 2023-01-23 DIAGNOSIS — Z7984 Long term (current) use of oral hypoglycemic drugs: Secondary | ICD-10-CM | POA: Insufficient documentation

## 2023-01-23 DIAGNOSIS — G8929 Other chronic pain: Secondary | ICD-10-CM | POA: Diagnosis not present

## 2023-01-23 DIAGNOSIS — R0902 Hypoxemia: Secondary | ICD-10-CM | POA: Diagnosis not present

## 2023-01-23 DIAGNOSIS — Z743 Need for continuous supervision: Secondary | ICD-10-CM | POA: Diagnosis not present

## 2023-01-23 DIAGNOSIS — R1013 Epigastric pain: Secondary | ICD-10-CM | POA: Insufficient documentation

## 2023-01-23 DIAGNOSIS — R1084 Generalized abdominal pain: Secondary | ICD-10-CM | POA: Diagnosis not present

## 2023-01-23 LAB — COMPREHENSIVE METABOLIC PANEL
ALT: 42 U/L (ref 0–44)
AST: 82 U/L — ABNORMAL HIGH (ref 15–41)
Albumin: 3.3 g/dL — ABNORMAL LOW (ref 3.5–5.0)
Alkaline Phosphatase: 115 U/L (ref 38–126)
Anion gap: 10 (ref 5–15)
BUN: <5 mg/dL — ABNORMAL LOW (ref 6–20)
CO2: 24 mmol/L (ref 22–32)
Calcium: 8.5 mg/dL — ABNORMAL LOW (ref 8.9–10.3)
Chloride: 102 mmol/L (ref 98–111)
Creatinine, Ser: 0.49 mg/dL (ref 0.44–1.00)
GFR, Estimated: >60 mL/min (ref >60)
Glucose, Bld: 132 mg/dL — ABNORMAL HIGH (ref 70–99)
Potassium: 4.2 mmol/L (ref 3.5–5.1)
Sodium: 136 mmol/L (ref 135–145)
Total Bilirubin: 0.5 mg/dL (ref 0.3–1.2)
Total Protein: 6.7 g/dL (ref 6.5–8.1)

## 2023-01-23 LAB — CBC
HCT: 30.8 % — ABNORMAL LOW (ref 36.0–46.0)
Hemoglobin: 9.1 g/dL — ABNORMAL LOW (ref 12.0–15.0)
MCH: 23.6 pg — ABNORMAL LOW (ref 26.0–34.0)
MCHC: 29.5 g/dL — ABNORMAL LOW (ref 30.0–36.0)
MCV: 80 fL (ref 80.0–100.0)
Platelets: 224 10*3/uL (ref 150–400)
RBC: 3.85 MIL/uL — ABNORMAL LOW (ref 3.87–5.11)
RDW: 20.8 % — ABNORMAL HIGH (ref 11.5–15.5)
WBC: 4.6 10*3/uL (ref 4.0–10.5)
nRBC: 0 % (ref 0.0–0.2)

## 2023-01-23 LAB — URINALYSIS, ROUTINE W REFLEX MICROSCOPIC
Bilirubin Urine: NEGATIVE
Glucose, UA: NEGATIVE mg/dL
Hgb urine dipstick: NEGATIVE
Ketones, ur: NEGATIVE mg/dL
Leukocytes,Ua: NEGATIVE
Nitrite: NEGATIVE
Protein, ur: NEGATIVE mg/dL
Specific Gravity, Urine: 1.01 (ref 1.005–1.030)
pH: 5 (ref 5.0–8.0)

## 2023-01-23 LAB — LIPASE, BLOOD: Lipase: 58 U/L — ABNORMAL HIGH (ref 11–51)

## 2023-01-23 MED ORDER — HYOSCYAMINE SULFATE 0.125 MG SL SUBL
0.1250 mg | SUBLINGUAL_TABLET | SUBLINGUAL | 0 refills | Status: DC | PRN
Start: 2023-01-23 — End: 2023-10-30

## 2023-01-23 MED ORDER — HYOSCYAMINE SULFATE 0.125 MG SL SUBL: 0.1250 mg | SUBLINGUAL_TABLET | SUBLINGUAL | 0 refills | Status: DC | PRN

## 2023-01-23 MED ORDER — HYOSCYAMINE SULFATE 0.125 MG SL SUBL
0.1250 mg | SUBLINGUAL_TABLET | Freq: Once | SUBLINGUAL | Status: AC
  Administered 2023-01-23: 0.125 mg via ORAL
  Filled 2023-01-23: qty 1

## 2023-01-23 MED ORDER — ONDANSETRON 4 MG PO TBDP: 4.0000 mg | ORAL_TABLET | Freq: Three times a day (TID) | ORAL | 0 refills | Status: DC | PRN

## 2023-01-23 NOTE — ED Provider Notes (Signed)
[place] EMERGENCY DEPARTMENT AT [facility] Provider Note   CSN: [id number] Arrival date & time: 01/23/23  8657     History  Chief Complaint  Patient presents with   Abdominal Pain    [name] is a 57 y.o. female who is 26 visits in the last 6 months to the emergency department.  She has been seen for the last several days and was seen yesterday for abdominal pain.  She is here for epigastric abdominal pain which is chronic.  She has a past medical history of polysubstance abuse, schizoaffective disorder and alcohol abuse.  She denies any crack cocaine use within the last 24 hours.  At present patient is sleeping soundly.  When awoken she states that she was here for her abdominal pain which is feeling better and mostly she is hungry now.  She denies any chest pain or shortness of breath, active vomiting.   Abdominal Pain      Home Medications Prior to Admission medications   Medication Sig Start Date End Date Taking? Authorizing Provider  albuterol (VENTOLIN HFA) 108 (90 Base) MCG/ACT inhaler Inhale 1-2 puffs into the lungs every 4 (four) hours as needed for wheezing or shortness of breath. 12/24/22   [provider]  budesonide-formoterol (SYMBICORT) 160-4.5 MCG/ACT inhaler Inhale 2 puffs into the lungs 2 (two) times daily. Patient not taking: Reported on 01/11/2023 12/01/22   Maretta Bees, MD  metFORMIN (GLUCOPHAGE-XR) 750 MG 24 hr tablet Take 750 mg by mouth daily. Patient not taking: Reported on 01/11/2023 12/30/22   [provider]  ondansetron (ZOFRAN) 4 MG tablet Take 1 tablet (4 mg total) by mouth every 6 (six) hours. 01/20/23   Prosperi, Christian H, PA-C  OXYGEN Inhale 4-5 L into the lungs as needed (COPD).    [provider]  zonisamide (ZONEGRAN) 100 MG capsule Take 2 capsules (200 mg total) by mouth daily. 01/12/23   Lewie Chamber, MD      Allergies    Iron dextran, Aspirin, and Penicillins    Review of Systems   Review  of Systems  Gastrointestinal:  Positive for abdominal pain.    Physical Exam Updated Vital Signs BP 118/72 (BP Location: Right Arm)   Pulse (!) 103   Temp 98 F (36.7 C) (Oral)   Resp 18   LMP 01/08/2011   SpO2 90%  Physical Exam Vitals and nursing note reviewed.  Constitutional:      General: She is not in acute distress.    Appearance: She is well-developed. She is not diaphoretic.  HENT:     Head: Normocephalic and atraumatic.     Right Ear: External ear normal.     Left Ear: External ear normal.     Nose: Nose normal.     Mouth/Throat:     Mouth: Mucous membranes are moist.  Eyes:     General: No scleral icterus.    Conjunctiva/sclera: Conjunctivae normal.  Cardiovascular:     Rate and Rhythm: Normal rate and regular rhythm.     Heart sounds: Normal heart sounds. No murmur heard.    No friction rub. No gallop.  Pulmonary:     Effort: Pulmonary effort is normal. No respiratory distress.     Breath sounds: Normal breath sounds.  Abdominal:     General: Bowel sounds are normal. There is no distension.     Palpations: Abdomen is soft. There is no mass.     Tenderness: There is no abdominal tenderness. There  is no guarding.  Musculoskeletal:     Cervical back: Normal range of motion.  Skin:    General: Skin is warm and dry.  Neurological:     Mental Status: She is alert and oriented to person, place, and time.  Psychiatric:        Behavior: Behavior normal.     ED Results / Procedures / Treatments   Labs (all labs ordered are listed, but only abnormal results are displayed) Labs Reviewed  LIPASE, BLOOD - Abnormal; Notable for the following components:      Result Value   Lipase 58 (*)    All other components within normal limits  COMPREHENSIVE METABOLIC PANEL - Abnormal; Notable for the following components:   Glucose, Bld 132 (*)    BUN <5 (*)    Calcium 8.5 (*)    Albumin 3.3 (*)    AST 82 (*)    All other components within normal limits  CBC -  Abnormal; Notable for the following components:   RBC 3.85 (*)    Hemoglobin 9.1 (*)    HCT 30.8 (*)    MCH 23.6 (*)    MCHC 29.5 (*)    RDW 20.8 (*)    All other components within normal limits  URINALYSIS, ROUTINE W REFLEX MICROSCOPIC - Abnormal; Notable for the following components:   APPearance HAZY (*)    All other components within normal limits    EKG None  Radiology CT ABDOMEN PELVIS W CONTRAST  Result Date: 01/22/2023 CLINICAL DATA:  Abdominal pain, acute, nonlocalized EXAM: CT ABDOMEN AND PELVIS WITH CONTRAST TECHNIQUE: Multidetector CT imaging of the abdomen and pelvis was performed using the standard protocol following bolus administration of intravenous contrast. RADIATION DOSE REDUCTION: This exam was performed according to the departmental dose-optimization program which includes automated exposure control, adjustment of the mA and/or kV according to patient size and/or use of iterative reconstruction technique. CONTRAST:  75mL OMNIPAQUE IOHEXOL 350 MG/ML SOLN COMPARISON:  CT scan abdomen and pelvis from 01/05/2023. FINDINGS: Lower chest: The lung bases are clear. No pleural effusion. The heart is normal in size. No pericardial effusion. There are few prominent venous collaterals in the inferior posterior midline pericardial region, nonspecific. Hepatobiliary: The liver is normal in size. Non-cirrhotic configuration. No suspicious mass. No intrahepatic or extrahepatic bile duct dilation. No calcified gallstones. Normal gallbladder wall thickness. No pericholecystic inflammatory changes. Pancreas: Unremarkable. No pancreatic ductal dilatation or surrounding inflammatory changes. Spleen: Within normal limits. No focal lesion. Adrenals/Urinary Tract: Adrenal glands are unremarkable. No suspicious renal mass. No hydronephrosis. No renal or ureteric calculi. PLEASE NOTE THAT THE EVALUATION OF PELVIC VISCERA IS MILDLY LIMITED DUE TO STREAK ARTIFACTS FROM RIGHT HIP PROSTHESIS. Nondiagnostic  evaluation of urinary bladder due to extensive streak artifacts. 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 No disproportionate dilation of the small or large bowel loops. No evidence of abnormal bowel wall thickening or inflammatory changes. Unremarkable appendix. Vascular/Lymphatic: No ascites or pneumoperitoneum. No abdominal or pelvic lymphadenopathy, by size criteria. No aneurysmal dilation of the major abdominal arteries. There are mild peripheral atherosclerotic vascular calcifications of the aorta and its major branches. Reproductive: Not well evaluated due to extensive streak artifacts. Surgically absent uterus. Other: Midline surgical scar noted. There are tiny bilateral fat containing inguinal hernias. Musculoskeletal: No suspicious osseous lesions. There are mild - moderate multilevel degenerative changes in the visualized spine. There is age indeterminate moderate anterior wedging deformity of T9 vertebral body. No significant  retropulsion or spinal canal compromise. Note is made of right hip arthroplasty. IMPRESSION: 1. No acute inflammatory process identified within the abdomen or pelvis. 2. 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 No evidence of bowel obstruction. 3. Age indeterminate moderate anterior wedging deformity of T9 vertebral body. No significant retropulsion or spinal canal compromise. Aortic Atherosclerosis (ICD10-I70.0). Electronically Signed   By: Jules Schick M.D.   On: 01/22/2023 11:06    Procedures Procedures    Medications Ordered in ED Medications  hyoscyamine (LEVSIN SL) SL tablet 0.125 mg (0.125 mg Oral Given 01/23/23 1610)    ED Course/ Medical Decision Making/ A&P                                 Medical Decision Making 57 year old female here with chronic epigastric abdominal pain.  I reviewed the patient's lab.  Lipase 58.  CMP shows mildly  elevated blood glucose.  No significant changes from previous labs over the past few days. Differential diagnosis of epigastric pain includes: Functional or nonulcer dyspepsia  PUD, GERD, Gastritis, (NSAIDs, alcohol, stress, H. pylori, pernicious anemia), pancreatitis or pancreatic cancer, overeating indigestion (high-fat foods, coffee), drugs (aspirin, antibiotics (eg, macrolides, metronidazole), corticosteroids, digoxin, narcotics, theophylline), gastroparesis, lactose intolerance, malabsorption gastric cancer, parasitic infection, (Giardia, Strongyloides, Ascaris) cholelithiasis, choledocholithiasis, or cholangitis, ACS, pericarditis, pneumonia, abdominal hernia, pregnancy, intestinal ischemia, esophageal rupture, gastric volvulus, hepatitis.  No suspicion for ACS in this patient.  Patient tolerating p.o. fluids appears appropriate for discharge at this time.  Amount and/or Complexity of Data Reviewed Labs: ordered.  Risk Prescription drug management.            Final Clinical Impression(s) / ED Diagnoses Final diagnoses:  None      Rx / DC Orders ED Discharge Orders     None         Arthor Captain, PA-C 01/23/23 9604    Margarita Grizzle, MD 01/23/23 [phone]

## 2023-01-23 NOTE — ED Triage Notes (Signed)
Patient reports chronic/ persistent pain across her abdomen with occasional nausea , seen here yesterday for the same complaints .

## 2023-01-23 NOTE — Discharge Instructions (Addendum)

## 2023-01-24 ENCOUNTER — Other Ambulatory Visit: Payer: Self-pay

## 2023-01-24 ENCOUNTER — Emergency Department (HOSPITAL_COMMUNITY)
Admission: EM | Admit: 2023-01-24 | Discharge: 2023-01-24 | Disposition: A | Discharge status: Home / Self Care | Payer: 59 | Attending: Emergency Medicine | Admitting: Emergency Medicine

## 2023-01-24 DIAGNOSIS — K2900 Acute gastritis without bleeding: Secondary | ICD-10-CM | POA: Insufficient documentation

## 2023-01-24 DIAGNOSIS — Z7951 Long term (current) use of inhaled steroids: Secondary | ICD-10-CM | POA: Diagnosis not present

## 2023-01-24 DIAGNOSIS — Z7984 Long term (current) use of oral hypoglycemic drugs: Secondary | ICD-10-CM | POA: Insufficient documentation

## 2023-01-24 DIAGNOSIS — E1165 Type 2 diabetes mellitus with hyperglycemia: Secondary | ICD-10-CM | POA: Diagnosis not present

## 2023-01-24 DIAGNOSIS — J449 Chronic obstructive pulmonary disease, unspecified: Secondary | ICD-10-CM | POA: Insufficient documentation

## 2023-01-24 DIAGNOSIS — Z743 Need for continuous supervision: Secondary | ICD-10-CM | POA: Diagnosis not present

## 2023-01-24 DIAGNOSIS — R739 Hyperglycemia, unspecified: Secondary | ICD-10-CM | POA: Diagnosis not present

## 2023-01-24 DIAGNOSIS — R110 Nausea: Secondary | ICD-10-CM | POA: Diagnosis not present

## 2023-01-24 DIAGNOSIS — R109 Unspecified abdominal pain: Secondary | ICD-10-CM | POA: Diagnosis not present

## 2023-01-24 LAB — CBG MONITORING, ED: Glucose-Capillary: 207 mg/dL — ABNORMAL HIGH (ref 70–99)

## 2023-01-24 MED ORDER — FAMOTIDINE 20 MG PO TABS
20.0000 mg | ORAL_TABLET | Freq: Once | ORAL | Status: AC
  Administered 2023-01-24: 20 mg via ORAL
  Filled 2023-01-24: qty 1

## 2023-01-24 MED ORDER — FAMOTIDINE 20 MG PO TABS: 20.0000 mg | ORAL_TABLET | Freq: Every day | ORAL | 0 refills | Status: DC

## 2023-01-24 MED ORDER — ALUM & MAG HYDROXIDE-SIMETH 200-200-20 MG/5ML PO SUSP
30.0000 mL | Freq: Once | ORAL | Status: AC
  Administered 2023-01-24: 30 mL via ORAL
  Filled 2023-01-24: qty 30

## 2023-01-24 MED ORDER — ONDANSETRON 4 MG PO TBDP
4.0000 mg | ORAL_TABLET | Freq: Once | ORAL | Status: AC
  Administered 2023-01-24: 4 mg via ORAL
  Filled 2023-01-24: qty 1

## 2023-01-24 NOTE — ED Provider Notes (Signed)
[place] EMERGENCY DEPARTMENT AT [facility] Provider Note   CSN: [id number] Arrival date & time: 01/24/23  4098     History  Chief Complaint  Patient presents with   Abdominal Pain    [name] is a 57 y.o. female.  Patient is a 57 year old female with past medical history of diabetes, COPD, substance use presenting to the emergency department with abdominal pain.  The patient states that she was seen in the emergency department the last 2 days for her abdominal pain and that she was given medication that improves the pain.  She states that when she woke up this morning the pain had returned.  She states that it feels like a sharp type of pain across the top of her abdomen with some mild nausea.  The history is provided by the patient.  Abdominal Pain      Home Medications Prior to Admission medications   Medication Sig Start Date End Date Taking? Authorizing Provider  famotidine (PEPCID) 20 MG tablet Take 1 tablet (20 mg total) by mouth daily. 01/24/23  Yes Theresia Lo, Benetta Spar K, DO  albuterol (VENTOLIN HFA) 108 (90 Base) MCG/ACT inhaler Inhale 1-2 puffs into the lungs every 4 (four) hours as needed for wheezing or shortness of breath. 12/24/22   [provider]  budesonide-formoterol (SYMBICORT) 160-4.5 MCG/ACT inhaler Inhale 2 puffs into the lungs 2 (two) times daily. Patient not taking: Reported on 01/11/2023 12/01/22   Maretta Bees, MD  hyoscyamine (LEVSIN/SL) 0.125 MG SL tablet Place 1 tablet (0.125 mg total) under the tongue every 4 (four) hours as needed (pain). Up to 1.25 mg daily 01/23/23   Arthor Captain, PA-C  metFORMIN (GLUCOPHAGE-XR) 750 MG 24 hr tablet Take 750 mg by mouth daily. Patient not taking: Reported on 01/11/2023 12/30/22   [provider]  ondansetron (ZOFRAN) 4 MG tablet Take 1 tablet (4 mg total) by mouth every 6 (six) hours. 01/20/23   Prosperi, Christian H, PA-C  ondansetron (ZOFRAN-ODT) 4 MG disintegrating tablet Take 1  tablet (4 mg total) by mouth every 8 (eight) hours as needed for nausea or vomiting. 01/23/23   Arthor Captain, PA-C  OXYGEN Inhale 4-5 L into the lungs as needed (COPD).    [provider]  zonisamide (ZONEGRAN) 100 MG capsule Take 2 capsules (200 mg total) by mouth daily. 01/12/23   Lewie Chamber, MD      Allergies    Iron dextran, Aspirin, and Penicillins    Review of Systems   Review of Systems  Gastrointestinal:  Positive for abdominal pain.    Physical Exam Updated Vital Signs BP (!) 146/79   Pulse 92   Temp 98.7 F (37.1 C) (Oral)   Resp 16   LMP 01/08/2011   SpO2 98%  Physical Exam Vitals and nursing note reviewed.  Constitutional:      General: She is not in acute distress.    Appearance: She is well-developed.  HENT:     Head: Normocephalic and atraumatic.     Mouth/Throat:     Mouth: Mucous membranes are moist.  Eyes:     Extraocular Movements: Extraocular movements intact.  Cardiovascular:     Rate and Rhythm: Normal rate and regular rhythm.  Pulmonary:     Effort: Pulmonary effort is normal.     Breath sounds: Normal breath sounds.  Abdominal:     General: Abdomen is flat.     Palpations: Abdomen is soft.     Tenderness: There is abdominal  tenderness in the epigastric area. There is no guarding or rebound.  Skin:    General: Skin is warm and dry.  Neurological:     General: No focal deficit present.     Mental Status: She is alert and oriented to person, place, and time.  Psychiatric:        Mood and Affect: Mood normal.        Behavior: Behavior normal.     ED Results / Procedures / Treatments   Labs (all labs ordered are listed, but only abnormal results are displayed) Labs Reviewed  CBG MONITORING, ED - Abnormal; Notable for the following components:      Result Value   Glucose-Capillary 207 (*)    All other components within normal limits    EKG None  Radiology CT ABDOMEN PELVIS W CONTRAST  Result Date: 01/22/2023 CLINICAL  DATA:  Abdominal pain, acute, nonlocalized EXAM: CT ABDOMEN AND PELVIS WITH CONTRAST TECHNIQUE: Multidetector CT imaging of the abdomen and pelvis was performed using the standard protocol following bolus administration of intravenous contrast. RADIATION DOSE REDUCTION: This exam was performed according to the departmental dose-optimization program which includes automated exposure control, adjustment of the mA and/or kV according to patient size and/or use of iterative reconstruction technique. CONTRAST:  75mL OMNIPAQUE IOHEXOL 350 MG/ML SOLN COMPARISON:  CT scan abdomen and pelvis from 01/05/2023. FINDINGS: Lower chest: The lung bases are clear. No pleural effusion. The heart is normal in size. No pericardial effusion. There are few prominent venous collaterals in the inferior posterior midline pericardial region, nonspecific. Hepatobiliary: The liver is normal in size. Non-cirrhotic configuration. No suspicious mass. No intrahepatic or extrahepatic bile duct dilation. No calcified gallstones. Normal gallbladder wall thickness. No pericholecystic inflammatory changes. Pancreas: Unremarkable. No pancreatic ductal dilatation or surrounding inflammatory changes. Spleen: Within normal limits. No focal lesion. Adrenals/Urinary Tract: Adrenal glands are unremarkable. No suspicious renal mass. No hydronephrosis. No renal or ureteric calculi. PLEASE NOTE THAT THE EVALUATION OF PELVIC VISCERA IS MILDLY LIMITED DUE TO STREAK ARTIFACTS FROM RIGHT HIP PROSTHESIS. Nondiagnostic evaluation of urinary bladder due to extensive streak artifacts. 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 No disproportionate dilation of the small or large bowel loops. No evidence of abnormal bowel wall thickening or inflammatory changes. Unremarkable appendix. Vascular/Lymphatic: No ascites or pneumoperitoneum. No abdominal or pelvic lymphadenopathy, by size criteria. No  aneurysmal dilation of the major abdominal arteries. There are mild peripheral atherosclerotic vascular calcifications of the aorta and its major branches. Reproductive: Not well evaluated due to extensive streak artifacts. Surgically absent uterus. Other: Midline surgical scar noted. There are tiny bilateral fat containing inguinal hernias. Musculoskeletal: No suspicious osseous lesions. There are mild - moderate multilevel degenerative changes in the visualized spine. There is age indeterminate moderate anterior wedging deformity of T9 vertebral body. No significant retropulsion or spinal canal compromise. Note is made of right hip arthroplasty. IMPRESSION: 1. No acute inflammatory process identified within the abdomen or pelvis. 2. 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 No evidence of bowel obstruction. 3. Age indeterminate moderate anterior wedging deformity of T9 vertebral body. No significant retropulsion or spinal canal compromise. Aortic Atherosclerosis (ICD10-I70.0). Electronically Signed   By: Jules Schick M.D.   On: 01/22/2023 11:06    Procedures Procedures    Medications Ordered in ED Medications  ondansetron (ZOFRAN-ODT) disintegrating tablet 4 mg (4 mg Oral Given 01/24/23 0735)  famotidine (PEPCID) tablet 20 mg (20 mg Oral Given  01/24/23 0735)  alum & mag hydroxide-simeth (MAALOX/MYLANTA) 200-200-20 MG/5ML suspension 30 mL (30 mLs Oral Given 01/24/23 0734)    ED Course/ Medical Decision Making/ A&P                                 Medical Decision Making This patient presents to the ED with chief complaint(s) of abdominal pain with pertinent past medical history of DM, COPD, substance use which further complicates the presenting complaint. The complaint involves an extensive differential diagnosis and also carries with it a high risk of complications and morbidity.    The differential diagnosis includes gastritis, GERD, normal blood  work and CT scan in the last 2 days making a pancreatitis, hepatitis, cholelithiasis or cholecystitis unlikely, considering hyperglycemic crisis  Additional history obtained: Additional history obtained from N/A Records reviewed multiple recent ED records  ED Course and Reassessment: On patient's arrival to the emergency department she is hemodynamically stable in no acute distress, requesting to drink water.  The patient did have some mild epigastric tenderness and was treated with a GI cocktail.  Labs from yesterday were reviewed that showed a mild transaminitis that is's been stable on her last several labs as well as a CT scan performed 2 days ago that showed no intra-abdominal abnormality.  Patient was recommended continued antacid treatment and to follow-up with her primary doctor.  Blood sugar was mildly elevated today but not within range for DKA.  She has been tolerating p.o.  She is stable for discharge home.  Independent labs interpretation:  The following labs were independently interpreted: mild hyperglycemia  Independent visualization of imaging: - N/A  Consultation: - Consulted or discussed management/test interpretation w/ external professional: N/A  Consideration for admission or further workup: Patient has no emergent conditions requiring admission or further work-up at this time and is stable for discharge home with primary care follow-up  Social Determinants of health: Polysubstance use    Risk OTC drugs. Prescription drug management.          Final Clinical Impression(s) / ED Diagnoses Final diagnoses:  Acute gastritis without hemorrhage, unspecified gastritis type    Rx / DC Orders ED Discharge Orders          Ordered    famotidine (PEPCID) 20 MG tablet  Daily        01/24/23 0745              Rexford Maus, DO 01/24/23 [phone]

## 2023-01-24 NOTE — ED Triage Notes (Signed)
Pt arrives to ED c/o generalized ABD pain/N/V. Pt was seen yesterday for same issues. Pt endorses prescription for medications but has not picked them up yet.

## 2023-01-24 NOTE — ED Notes (Signed)
RN provided pt water

## 2023-01-24 NOTE — Discharge Instructions (Signed)
You were seen in the emergency department for your abdominal pain.  This is likely related to gastritis or acid reflux and I have given you an antacid that you should take daily for at least the next 2 weeks to help with your symptoms.  You can follow-up with your primary doctor to have your symptoms rechecked.  Your blood sugar was mildly high here in the emergency department and you should make sure that you are taking your diabetes medications as prescribed and can have this rechecked by your primary doctor.  You should return to the emergency department for significantly worsening pain, repetitive vomiting, fevers or any other new or concerning symptoms.

## 2023-01-25 ENCOUNTER — Encounter (HOSPITAL_COMMUNITY): Payer: Self-pay | Admitting: Emergency Medicine

## 2023-01-25 ENCOUNTER — Emergency Department (HOSPITAL_COMMUNITY): Payer: 59

## 2023-01-25 ENCOUNTER — Emergency Department (HOSPITAL_COMMUNITY)
Admission: EM | Admit: 2023-01-25 | Discharge: 2023-01-25 | Discharge status: Against Medical Advice | Payer: 59 | Source: Home / Self Care | Attending: Emergency Medicine | Admitting: Emergency Medicine

## 2023-01-25 DIAGNOSIS — Z8249 Family history of ischemic heart disease and other diseases of the circulatory system: Secondary | ICD-10-CM | POA: Insufficient documentation

## 2023-01-25 DIAGNOSIS — I10 Essential (primary) hypertension: Secondary | ICD-10-CM | POA: Insufficient documentation

## 2023-01-25 DIAGNOSIS — Z833 Family history of diabetes mellitus: Secondary | ICD-10-CM | POA: Diagnosis not present

## 2023-01-25 DIAGNOSIS — I517 Cardiomegaly: Secondary | ICD-10-CM | POA: Diagnosis not present

## 2023-01-25 DIAGNOSIS — E119 Type 2 diabetes mellitus without complications: Secondary | ICD-10-CM | POA: Diagnosis not present

## 2023-01-25 DIAGNOSIS — Z5329 Procedure and treatment not carried out because of patient's decision for other reasons: Secondary | ICD-10-CM | POA: Insufficient documentation

## 2023-01-25 DIAGNOSIS — Z96641 Presence of right artificial hip joint: Secondary | ICD-10-CM | POA: Diagnosis not present

## 2023-01-25 DIAGNOSIS — J449 Chronic obstructive pulmonary disease, unspecified: Secondary | ICD-10-CM | POA: Diagnosis not present

## 2023-01-25 DIAGNOSIS — R739 Hyperglycemia, unspecified: Secondary | ICD-10-CM | POA: Diagnosis not present

## 2023-01-25 DIAGNOSIS — E039 Hypothyroidism, unspecified: Secondary | ICD-10-CM | POA: Insufficient documentation

## 2023-01-25 DIAGNOSIS — F17210 Nicotine dependence, cigarettes, uncomplicated: Secondary | ICD-10-CM | POA: Diagnosis not present

## 2023-01-25 DIAGNOSIS — R0602 Shortness of breath: Secondary | ICD-10-CM | POA: Insufficient documentation

## 2023-01-25 DIAGNOSIS — R0902 Hypoxemia: Secondary | ICD-10-CM | POA: Diagnosis not present

## 2023-01-25 DIAGNOSIS — I499 Cardiac arrhythmia, unspecified: Secondary | ICD-10-CM | POA: Diagnosis not present

## 2023-01-25 DIAGNOSIS — Z743 Need for continuous supervision: Secondary | ICD-10-CM | POA: Diagnosis not present

## 2023-01-25 DIAGNOSIS — R110 Nausea: Secondary | ICD-10-CM | POA: Diagnosis not present

## 2023-01-25 LAB — I-STAT VENOUS BLOOD GAS, ED
Acid-base deficit: 3 mmol/L — ABNORMAL HIGH (ref 0.0–2.0)
Bicarbonate: 19.9 mmol/L — ABNORMAL LOW (ref 20.0–28.0)
Calcium, Ion: 0.81 mmol/L — CL (ref 1.15–1.40)
HCT: 36 % (ref 36.0–46.0)
Hemoglobin: 12.2 g/dL (ref 12.0–15.0)
O2 Saturation: 100 %
Potassium: 3.9 mmol/L (ref 3.5–5.1)
Sodium: 137 mmol/L (ref 135–145)
TCO2: 21 mmol/L — ABNORMAL LOW (ref 22–32)
pCO2, Ven: 28.7 mmHg — ABNORMAL LOW (ref 44–60)
pH, Ven: 7.449 — ABNORMAL HIGH (ref 7.25–7.43)
pO2, Ven: 188 mmHg — ABNORMAL HIGH (ref 32–45)

## 2023-01-25 LAB — ETHANOL: Alcohol, Ethyl (B): 208 mg/dL — ABNORMAL HIGH (ref <10)

## 2023-01-25 LAB — CBC WITH DIFFERENTIAL/PLATELET
Abs Immature Granulocytes: 0 K/uL (ref 0.00–0.07)
Basophils Absolute: 0 K/uL (ref 0.0–0.1)
Basophils Relative: 0 %
Eosinophils Absolute: 0 K/uL (ref 0.0–0.5)
Eosinophils Relative: 0 %
HCT: 34.3 % — ABNORMAL LOW (ref 36.0–46.0)
Hemoglobin: 10 g/dL — ABNORMAL LOW (ref 12.0–15.0)
Lymphocytes Relative: 40 %
Lymphs Abs: 2.3 K/uL (ref 0.7–4.0)
MCH: 23.7 pg — ABNORMAL LOW (ref 26.0–34.0)
MCHC: 29.2 g/dL — ABNORMAL LOW (ref 30.0–36.0)
MCV: 81.3 fL (ref 80.0–100.0)
Monocytes Absolute: 0.1 K/uL (ref 0.1–1.0)
Monocytes Relative: 1 %
Neutro Abs: 3.4 K/uL (ref 1.7–7.7)
Neutrophils Relative %: 59 %
Platelets: 216 K/uL (ref 150–400)
RBC: 4.22 MIL/uL (ref 3.87–5.11)
RDW: 21.7 % — ABNORMAL HIGH (ref 11.5–15.5)
WBC: 5.8 K/uL (ref 4.0–10.5)
nRBC: 0 % (ref 0.0–0.2)
nRBC: 0 /100{WBCs}

## 2023-01-25 LAB — TROPONIN I (HIGH SENSITIVITY): Troponin I (High Sensitivity): 3 ng/L (ref <18)

## 2023-01-25 NOTE — ED Notes (Signed)
Patient now states that her stomach hurts.

## 2023-01-25 NOTE — ED Notes (Signed)
Patient reports she wants to go outside and smoke a cigarette.  Patient educated on the dangers of smoking and how she would not be able to go outside and smoke while she is being treated due to this being a smoke free campus.  Patient stated that she is feeling better and wants to just leave.  Patient seen leaving ED with a steady gait.

## 2023-01-25 NOTE — ED Provider Notes (Signed)
[facility] Emergency Department Provider Note MRN:  [id number]  Arrival date & time: 01/25/23     Chief Complaint   Shortness of Breath   History of Present Illness   [name] is a 57 y.o. year-old female with a history of COPD presenting to the ED with chief complaint of SOB.  SOB this evening.  Denies alcohol use.  No chest pain.  Here very often.  Review of Systems  A thorough review of systems was obtained and all systems are negative except as noted in the HPI and PMH.   Patient's Health History    Past Medical History:  Diagnosis Date   Abdominal pain    Accidental drug overdose April 2013   Anxiety    Atrial fibrillation (HCC) 09/29/11   converted spontaneously   Chronic back pain    Chronic knee pain    Chronic nausea    Chronic pain    COPD (chronic obstructive pulmonary disease) (HCC)    Depression    Diabetes mellitus    states her doctor took her off all DM meds in past month   Diabetic neuropathy (HCC)    Dyspnea    with exertion    GERD (gastroesophageal reflux disease)    Headache(784.0)    migraines    HTN (hypertension)    not on meds since in a year    Hyperlipidemia    Hypothyroidism    not on meds in a while    Mental disorder    Bipolar and schizophrenic   Nausea and vomiting 01/02/2023   Requires supplemental oxygen    as needed per patient    Schizophrenia (HCC)    Schizophrenia, acute (HCC) 11/13/2017   Tobacco abuse     Past Surgical History:  Procedure Laterality Date   ABDOMINAL HYSTERECTOMY     BLADDER SUSPENSION  03/04/2011   Procedure: [facility] PROCEDURE;  Surgeon: Jonna Coup MacDiarmid;  Location: [facility] ORS;  Service: Urology;  Laterality: N/A;   BOWEL RESECTION N/A 04/18/2022   Procedure: SMALL BOWEL RESECTION;  Surgeon: Harriette Bouillon, MD;  Location: [facility] OR;  Service: General;  Laterality: N/A;   CYSTOCELE REPAIR  03/04/2011   Procedure: ANTERIOR REPAIR (CYSTOCELE);  Surgeon: Martina Sinner;   Location: [facility];  Service: Urology;  Laterality: N/A;   CYSTOSCOPY  03/04/2011   Procedure: CYSTOSCOPY;  Surgeon: Jonna Coup MacDiarmid;  Location: [facility];  Service: Urology;  Laterality: N/A;   ESOPHAGOGASTRODUODENOSCOPY (EGD) WITH PROPOFOL N/A 05/12/2017   Procedure: ESOPHAGOGASTRODUODENOSCOPY (EGD) WITH PROPOFOL;  Surgeon: Ovidio Kin, MD;  Location: [facility];  Service: General;  Laterality: N/A;   GASTRIC ROUX-EN-Y N/A 03/25/2016   Procedure: LAPAROSCOPIC ROUX-EN-Y GASTRIC BYPASS WITH UPPER ENDOSCOPY;  Surgeon: Glenna Fellows, MD;  Location: [facility];  Service: General;  Laterality: N/A;   KNEE SURGERY     LAPAROSCOPIC ASSISTED VAGINAL HYSTERECTOMY  03/04/2011   Procedure: LAPAROSCOPIC ASSISTED VAGINAL HYSTERECTOMY;  Surgeon: Jeani Hawking, MD;  Location: [facility];  Service: Gynecology;  Laterality: N/A;   LAPAROTOMY N/A 04/18/2022   Procedure: EXPLORATORY LAPAROTOMY;  Surgeon: Harriette Bouillon, MD;  Location: [facility] OR;  Service: General;  Laterality: N/A;   LAPAROTOMY N/A 04/24/2022   Procedure: BRING BACK EXPLORATORY LAPAROTOMY;  Surgeon: Violeta Gelinas, MD;  Location: [facility] OR;  Service: General;  Laterality: N/A;   TOTAL HIP ARTHROPLASTY Right 08/27/2022   Procedure: TOTAL HIP ARTHROPLASTY;  Surgeon: Joen Laura, MD;  Location: [facility] OR;  Service: Orthopedics;  Laterality:  Right;    Family History  Problem Relation Age of Onset   Heart attack Father        60s   Diabetes Mother    Heart disease Mother    Hypertension Mother    Heart attack Sister        72   COPD Other    Breast cancer Neg Hx     Social History   Socioeconomic History   Marital status: Legally Separated    Spouse name: Not on file   Number of children: Not on file   Years of education: Not on file   Highest education level: Not on file  Occupational History   Not on file  Tobacco Use   Smoking status: Every Day    Current packs/day: 1.00    Types: Cigarettes   Smokeless tobacco: Never  Vaping Use    Vaping status: Never Used  Substance and Sexual Activity   Alcohol use: Yes    Comment: 40 ounces daily   Drug use: Yes    Types: Cocaine, Marijuana, "Crack" cocaine    Comment: last use 01/19/23   Sexual activity: Yes    Partners: Male  Other Topics Concern   Not on file  Social History Narrative   Not on file   Social Determinants of Health   Financial Resource Strain: Not on file  Food Insecurity: No Food Insecurity (01/11/2023)   Hunger Vital Sign    Worried About Running Out of Food in the Last Year: Never true    Ran Out of Food in the Last Year: Never true  Transportation Needs: No Transportation Needs (01/11/2023)   PRAPARE - Administrator, Civil Service (Medical): No    Lack of Transportation (Non-Medical): No  Physical Activity: Not on file  Stress: Not on file  Social Connections: Unknown (01/06/2023)   Received from [facility]   Social Network    Social Network: Not on file  Intimate Partner Violence: Not At Risk (01/11/2023)   Humiliation, Afraid, Rape, and Kick questionnaire    Fear of Current or Ex-Partner: No    Emotionally Abused: No    Physically Abused: No    Sexually Abused: No     Physical Exam   Vitals:   01/25/23 0400 01/25/23 0419  BP: 113/70   Pulse: (!) 122   Resp: (!) 28   Temp:  98 F (36.7 C)  SpO2: 94%     CONSTITUTIONAL:  chronically ill-appearing, NAD NEURO/PSYCH:  Alert and oriented x 3, no focal deficits EYES:  eyes equal and reactive ENT/NECK:  no LAD, no JVD CARDIO:  tachycardic rate, well-perfused, normal S1 and S2 PULM:  CTAB no wheezing or rhonchi, mild tachypnea GI/GU:  non-distended, non-tender MSK/SPINE:  No gross deformities, no edema SKIN:  no rash, atraumatic   *Additional and/or pertinent findings included in MDM below  Diagnostic and Interventional Summary    EKG Interpretation Date/Time:  Sunday January 25 2023 03:53:03 EDT Ventricular Rate:  121 PR Interval:  136 QRS Duration:  88 QT  Interval:  318 QTC Calculation: 452 R Axis:   20  Text Interpretation: Sinus tachycardia Ventricular premature complex Aberrant complex LAE, consider biatrial enlargement Abnormal R-wave progression, early transition Confirmed by Kennis Carina [phone]) on 01/25/2023 4:04:40 AM       Labs Reviewed  CBC WITH DIFFERENTIAL/PLATELET - Abnormal; Notable for the following components:      Result Value   Hemoglobin 10.0 (*)    HCT  34.3 (*)    MCH 23.7 (*)    MCHC 29.2 (*)    RDW 21.7 (*)    All other components within normal limits  ETHANOL - Abnormal; Notable for the following components:   Alcohol, Ethyl (B) 208 (*)    All other components within normal limits  I-STAT VENOUS BLOOD GAS, ED - Abnormal; Notable for the following components:   pH, Ven 7.449 (*)    pCO2, Ven 28.7 (*)    pO2, Ven 188 (*)    Bicarbonate 19.9 (*)    TCO2 21 (*)    Acid-base deficit 3.0 (*)    Calcium, Ion 0.81 (*)    All other components within normal limits  TROPONIN I (HIGH SENSITIVITY)  TROPONIN I (HIGH SENSITIVITY)    DG Chest Portable 1 View  Final Result      Medications - No data to display   Procedures  /  Critical Care Procedures  ED Course and Medical Decision Making  Initial Impression and Ddx Patient endorses shortness of breath this evening.  Also admits that she has not really been using her home oxygen.  Does not know what her normal oxygen rate is at home.  Denies alcohol use but seems intoxicated.  Mild tachypnea and tachycardia.  Awaiting labs, chest x-ray.  Past medical/surgical history that increases complexity of ED encounter: COPD  Interpretation of Diagnostics Not yet completed  Patient Reassessment and Ultimate Disposition/Management     Prior to labs and chest x-ray patient says that she wants to leave so she can have a cigarette.  We discussed the importance of the workup and the risks of leaving, including death.  Patient still wants to leave.  Left AGAINST MEDICAL  ADVICE.  Patient management required discussion with the following services or consulting groups:  None  Complexity of Problems Addressed Acute illness or injury that poses threat of life of bodily function  Additional Data Reviewed and Analyzed Further history obtained from: None  Additional Factors Impacting ED Encounter Risk Consideration of hospitalization  Elmer Sow. [name], MD [facility] Emergency Medicine [facility] [email or web address]  Final Clinical Impressions(s) / ED Diagnoses     ICD-10-CM   1. SOB (shortness of breath)  R06.02       ED Discharge Orders     None        Discharge Instructions Discussed with and Provided to Patient:   Discharge Instructions   None      Sabas Sous, MD 01/25/23 [phone]

## 2023-01-25 NOTE — ED Triage Notes (Signed)
Patient BIB [facility] c/o shob.  Patient non-compliant with home 02 and continues to smoke cigarettes.  EMS gave breathing treatments, solu medrol and zofran.    20 L FA 410 CBG (non-compliant with insulin) 0/10 pain

## 2023-01-29 ENCOUNTER — Other Ambulatory Visit: Payer: Self-pay

## 2023-01-29 ENCOUNTER — Emergency Department (HOSPITAL_COMMUNITY): Payer: 59

## 2023-01-29 ENCOUNTER — Emergency Department (HOSPITAL_COMMUNITY)
Admission: EM | Admit: 2023-01-29 | Discharge: 2023-01-29 | Disposition: A | Discharge status: Home / Self Care | Payer: 59 | Attending: Emergency Medicine | Admitting: Emergency Medicine

## 2023-01-29 DIAGNOSIS — Z7984 Long term (current) use of oral hypoglycemic drugs: Secondary | ICD-10-CM | POA: Insufficient documentation

## 2023-01-29 DIAGNOSIS — Z79899 Other long term (current) drug therapy: Secondary | ICD-10-CM | POA: Diagnosis not present

## 2023-01-29 DIAGNOSIS — R110 Nausea: Secondary | ICD-10-CM | POA: Diagnosis not present

## 2023-01-29 DIAGNOSIS — R0602 Shortness of breath: Secondary | ICD-10-CM | POA: Diagnosis not present

## 2023-01-29 DIAGNOSIS — I10 Essential (primary) hypertension: Secondary | ICD-10-CM | POA: Diagnosis not present

## 2023-01-29 DIAGNOSIS — R109 Unspecified abdominal pain: Secondary | ICD-10-CM | POA: Diagnosis not present

## 2023-01-29 DIAGNOSIS — Y906 Blood alcohol level of 120-199 mg/100 ml: Secondary | ICD-10-CM | POA: Insufficient documentation

## 2023-01-29 DIAGNOSIS — E119 Type 2 diabetes mellitus without complications: Secondary | ICD-10-CM | POA: Diagnosis not present

## 2023-01-29 DIAGNOSIS — R1084 Generalized abdominal pain: Secondary | ICD-10-CM | POA: Insufficient documentation

## 2023-01-29 DIAGNOSIS — F1010 Alcohol abuse, uncomplicated: Secondary | ICD-10-CM | POA: Insufficient documentation

## 2023-01-29 LAB — CBC WITH DIFFERENTIAL/PLATELET
Abs Immature Granulocytes: 0.01 10*3/uL (ref 0.00–0.07)
Basophils Absolute: 0 10*3/uL (ref 0.0–0.1)
Basophils Relative: 1 %
Eosinophils Absolute: 0 10*3/uL (ref 0.0–0.5)
Eosinophils Relative: 0 %
HCT: 30 % — ABNORMAL LOW (ref 36.0–46.0)
Hemoglobin: 9.4 g/dL — ABNORMAL LOW (ref 12.0–15.0)
Immature Granulocytes: 0 %
Lymphocytes Relative: 19 %
Lymphs Abs: 1.3 10*3/uL (ref 0.7–4.0)
MCH: 24.2 pg — ABNORMAL LOW (ref 26.0–34.0)
MCHC: 31.3 g/dL (ref 30.0–36.0)
MCV: 77.3 fL — ABNORMAL LOW (ref 80.0–100.0)
Monocytes Absolute: 0.6 10*3/uL (ref 0.1–1.0)
Monocytes Relative: 9 %
Neutro Abs: 5 10*3/uL (ref 1.7–7.7)
Neutrophils Relative %: 71 %
Platelets: 261 10*3/uL (ref 150–400)
RBC: 3.88 MIL/uL (ref 3.87–5.11)
RDW: 19.9 % — ABNORMAL HIGH (ref 11.5–15.5)
WBC: 7 10*3/uL (ref 4.0–10.5)
nRBC: 0 % (ref 0.0–0.2)

## 2023-01-29 LAB — URINALYSIS, ROUTINE W REFLEX MICROSCOPIC
Bilirubin Urine: NEGATIVE
Glucose, UA: NEGATIVE mg/dL
Hgb urine dipstick: NEGATIVE
Ketones, ur: NEGATIVE mg/dL
Leukocytes,Ua: NEGATIVE
Nitrite: NEGATIVE
Protein, ur: NEGATIVE mg/dL
Specific Gravity, Urine: 1.006 (ref 1.005–1.030)
pH: 5 (ref 5.0–8.0)

## 2023-01-29 LAB — COMPREHENSIVE METABOLIC PANEL
ALT: 51 U/L — ABNORMAL HIGH (ref 0–44)
AST: 61 U/L — ABNORMAL HIGH (ref 15–41)
Albumin: 3.3 g/dL — ABNORMAL LOW (ref 3.5–5.0)
Alkaline Phosphatase: 151 U/L — ABNORMAL HIGH (ref 38–126)
Anion gap: 17 — ABNORMAL HIGH (ref 5–15)
BUN: 13 mg/dL (ref 6–20)
CO2: 21 mmol/L — ABNORMAL LOW (ref 22–32)
Calcium: 8.1 mg/dL — ABNORMAL LOW (ref 8.9–10.3)
Chloride: 94 mmol/L — ABNORMAL LOW (ref 98–111)
Creatinine, Ser: 0.69 mg/dL (ref 0.44–1.00)
GFR, Estimated: >60 mL/min (ref >60)
Glucose, Bld: 187 mg/dL — ABNORMAL HIGH (ref 70–99)
Potassium: 4 mmol/L (ref 3.5–5.1)
Sodium: 132 mmol/L — ABNORMAL LOW (ref 135–145)
Total Bilirubin: 1.1 mg/dL (ref 0.3–1.2)
Total Protein: 6.5 g/dL (ref 6.5–8.1)

## 2023-01-29 LAB — RAPID URINE DRUG SCREEN, HOSP PERFORMED
Amphetamines: NOT DETECTED
Barbiturates: NOT DETECTED
Benzodiazepines: NOT DETECTED
Cocaine: NOT DETECTED
Opiates: NOT DETECTED
Tetrahydrocannabinol: NOT DETECTED

## 2023-01-29 LAB — ETHANOL: Alcohol, Ethyl (B): 186 mg/dL — ABNORMAL HIGH (ref <10)

## 2023-01-29 LAB — LIPASE, BLOOD: Lipase: 36 U/L (ref 11–51)

## 2023-01-29 MED ORDER — NICOTINE 14 MG/24HR TD PT24: 14.0000 mg | MEDICATED_PATCH | Freq: Every day | TRANSDERMAL | Status: DC

## 2023-01-29 MED ORDER — DIAZEPAM 5 MG/ML IJ SOLN: 5.0000 mg | Freq: Once | INTRAMUSCULAR | Status: DC

## 2023-01-29 MED ORDER — NICOTINE POLACRILEX 2 MG MT GUM: 2.0000 mg | CHEWING_GUM | OROMUCOSAL | Status: DC | PRN

## 2023-01-29 NOTE — ED Notes (Signed)
Pt gone to Xray 

## 2023-01-29 NOTE — ED Triage Notes (Signed)
Pt BIB [facility] from home with c/o SOB, abd pain, and nausea. Pt is non compliant with medications. Pt stated she had a 40 oz and likes to do cocaine.   EMS VS P 92 R 20 BP 125/74 O2 96% on 2L to 100% CBG 218

## 2023-01-29 NOTE — ED Notes (Signed)
Pt kept getting up and coming out of the room. Pt would not stay hooked up to the monitor and repeatedly kept taking everything off. Pt stated that she was ready to go. Pt left AMA. MD Eloise Harman made aware.Marland KitchenMarland Kitchen

## 2023-01-29 NOTE — ED Provider Notes (Signed)
[place] EMERGENCY DEPARTMENT AT [facility] Provider Note   CSN: [id number] Arrival date & time: 01/29/23  1602     History  Chief Complaint  Patient presents with   Abdominal Pain    SOB, abd pain, and nausea    [name] is a 57 y.o. female.  57 year old female with history of bipolar, schizophrenia, diabetes, cocaine and alcohol abuse who presents emergency department with abdominal pain.  Patient has a history of multiple visits to the emergency department for abdominal pain has had CT scan as recently as 7 days ago which was reassuring.  Says that she started having abdominal pain last night that is generalized.  No nausea or vomiting.  No fevers.  Last bowel movement was yesterday and still passing gas today.  Says that she has been drinking alcohol and her last drink was earlier today.  Typically drinks 40 ounce beers but unable to quantify how many per day for me.  Last use of cocaine was yesterday as well.  Denies any other recreational drugs including opiates.  Says she has a history of hysterectomy and gastric bypass.       Home Medications Prior to Admission medications   Medication Sig Start Date End Date Taking? Authorizing Provider  albuterol (VENTOLIN HFA) 108 (90 Base) MCG/ACT inhaler Inhale 1-2 puffs into the lungs every 4 (four) hours as needed for wheezing or shortness of breath. 12/24/22   [provider]  budesonide-formoterol (SYMBICORT) 160-4.5 MCG/ACT inhaler Inhale 2 puffs into the lungs 2 (two) times daily. Patient not taking: Reported on 01/11/2023 12/01/22   Maretta Bees, MD  famotidine (PEPCID) 20 MG tablet Take 1 tablet (20 mg total) by mouth daily. 01/24/23   Elayne Snare K, DO  hyoscyamine (LEVSIN/SL) 0.125 MG SL tablet Place 1 tablet (0.125 mg total) under the tongue every 4 (four) hours as needed (pain). Up to 1.25 mg daily 01/23/23   Arthor Captain, PA-C  metFORMIN (GLUCOPHAGE-XR) 750 MG 24 hr tablet Take 750 mg by  mouth daily. Patient not taking: Reported on 01/11/2023 12/30/22   [provider]  ondansetron (ZOFRAN) 4 MG tablet Take 1 tablet (4 mg total) by mouth every 6 (six) hours. 01/20/23   Prosperi, Christian H, PA-C  ondansetron (ZOFRAN-ODT) 4 MG disintegrating tablet Take 1 tablet (4 mg total) by mouth every 8 (eight) hours as needed for nausea or vomiting. 01/23/23   Arthor Captain, PA-C  OXYGEN Inhale 4-5 L into the lungs as needed (COPD).    [provider]  zonisamide (ZONEGRAN) 100 MG capsule Take 2 capsules (200 mg total) by mouth daily. 01/12/23   Lewie Chamber, MD      Allergies    Iron dextran, Aspirin, and Penicillins    Review of Systems   Review of Systems  Physical Exam Updated Vital Signs BP 115/75   Pulse (!) 102   Resp (!) 22   Ht 5\' 2"  (1.575 m)   LMP 01/08/2011   SpO2 99%   BMI 20.16 kg/m  Physical Exam Vitals and nursing note reviewed.  Constitutional:      General: She is not in acute distress.    Appearance: She is well-developed.     Comments: Patient about room trying to take off monitoring equipment  HENT:     Head: Normocephalic and atraumatic.     Right Ear: External ear normal.     Left Ear: External ear normal.     Nose: Nose normal.  Mouth/Throat:     Comments: Mild tongue fasciculation Eyes:     Extraocular Movements: Extraocular movements intact.     Conjunctiva/sclera: Conjunctivae normal.     Pupils: Pupils are equal, round, and reactive to light.  Pulmonary:     Effort: Pulmonary effort is normal. No respiratory distress.  Abdominal:     General: Abdomen is flat. There is no distension.     Palpations: Abdomen is soft. There is no mass.     Tenderness: There is abdominal tenderness (Generalized). There is no guarding.     Comments: Scars from prior abdominal surgeries  Musculoskeletal:     Cervical back: Normal range of motion and neck supple.     Right lower leg: No edema.     Left lower leg: No edema.  Skin:     General: Skin is warm and dry.  Neurological:     Mental Status: She is alert and oriented to person, place, and time. Mental status is at baseline.     Cranial Nerves: No cranial nerve deficit.     Motor: No weakness.     Comments: Mild hand tremor  Psychiatric:        Mood and Affect: Mood normal.     ED Results / Procedures / Treatments   Labs (all labs ordered are listed, but only abnormal results are displayed) Labs Reviewed  COMPREHENSIVE METABOLIC PANEL - Abnormal; Notable for the following components:      Result Value   Sodium 132 (*)    Chloride 94 (*)    CO2 21 (*)    Glucose, Bld 187 (*)    Calcium 8.1 (*)    Albumin 3.3 (*)    AST 61 (*)    ALT 51 (*)    Alkaline Phosphatase 151 (*)    Anion gap 17 (*)    All other components within normal limits  CBC WITH DIFFERENTIAL/PLATELET - Abnormal; Notable for the following components:   Hemoglobin 9.4 (*)    HCT 30.0 (*)    MCV 77.3 (*)    MCH 24.2 (*)    RDW 19.9 (*)    All other components within normal limits  URINALYSIS, ROUTINE W REFLEX MICROSCOPIC - Abnormal; Notable for the following components:   APPearance HAZY (*)    All other components within normal limits  ETHANOL - Abnormal; Notable for the following components:   Alcohol, Ethyl (B) 186 (*)    All other components within normal limits  LIPASE, BLOOD  RAPID URINE DRUG SCREEN, HOSP PERFORMED    EKG EKG Interpretation Date/Time:  Thursday January 29 2023 16:36:37 EDT Ventricular Rate:  87 PR Interval:  127 QRS Duration:  93 QT Interval:  365 QTC Calculation: 440 R Axis:   23  Text Interpretation: Sinus rhythm Confirmed by Vonita Moss [phone]) on 01/29/2023 5:21:44 PM  Radiology DG Abdomen 1 View  Result Date: 01/29/2023 CLINICAL DATA:  Abdominal pain for a few days, worsened. obtain upright or decub to assess for sbo EXAM: ABDOMEN - 1 VIEW COMPARISON:  CT 01/22/2023 FINDINGS: View obtained standing. Few air-fluid levels within bowel in the  central abdomen, likely colon. No significant formed stool. No evidence of free intra-abdominal air, although the diaphragms are not included in the field of view. 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 Clothing artifact over the pelvis. Presumed lighter projects over the low left groin. IMPRESSION: Few air-fluid levels within bowel in the central abdomen, likely colon, nonspecific. No free air or bowel obstruction  on this single view. Electronically Signed   By: Narda Rutherford M.D.   On: 01/29/2023 17:35    Procedures Procedures    Medications Ordered in ED Medications - No data to display  ED Course/ Medical Decision Making/ A&P Clinical Course as of 01/30/23 1216  Thu Jan 29, 2023  1743 Hemoglobin(!): 9.4 Baseline of 10 [RP]  1745 Patient wondering about department.  Is now requesting to leave AGAINST MEDICAL ADVICE.  Appears to be clinically sober.  Do feel that she has capacity to make this decision at this time.  Instructed to follow-up with GI and her primary doctor in several days. [RP]    Clinical Course User Index [RP] Rondel Baton, MD                                  Medical Decision Making Amount and/or Complexity of Data Reviewed Labs: ordered. Decision-making details documented in ED Course. Radiology: ordered.  Risk OTC drugs. Prescription drug management.   [name] is a 57 y.o. female with comorbidities that complicate the patient evaluation including bipolar, schizophrenia, diabetes, cocaine and alcohol abuse who presents emergency department with abdominal pain.   Initial Ddx:  Small bowel obstruction, pancreatitis, gastritis  MDM/Course:  Patient presents to the emergency department with generalized abdominal pain.  No nausea and vomiting.  No changes to her bowel habits.  Does have a history of abdominal surgeries.  Is here frequently in the emergency department with similar complaints and has had multiple CT scans recently.  Diffusely  tender to palpation on exam.  Sent off for lab work as well as an abdominal x-ray to evaluate for small bowel obstruction.  X-ray did not show any obvious signs of obstruction.  Lab work did show mild elevation in her LFTs.  This may be due to her alcohol use especially since he is not focally tender in the right upper quadrant or having any nausea or vomiting or fevers.  Upon re-evaluation patient was pacing about the halls of the emergency department disrupting patient care for other patients.  Redirected back to her room.  She said at that point in time she wished to leave AGAINST MEDICAL ADVICE.  She understands that without additional imaging were unable to completely rule out small bowel obstruction or liver pathology and that these could become life-threatening.  She still wishes to go and feel that she is clinically sober and has capacity to make that decision at this time.  Counseled to follow-up with her primary doctor and GI.  Return precautions discussed prior to discharge.  This patient presents to the ED for concern of complaints listed in HPI, this involves an extensive number of treatment options, and is a complaint that carries with it a high risk of complications and morbidity. Disposition including potential need for admission considered.   Dispo: DC Home. Return precautions discussed including, but not limited to, those listed in the AVS. Allowed pt time to ask questions which were answered fully prior to dc.  Records reviewed Outpatient Clinic Notes The following labs were independently interpreted: Chemistry and show  elevated LFTs I independently reviewed the following imaging with scope of interpretation limited to determining acute life threatening conditions related to emergency care:  Abdominal x-ray  and agree with the radiologist interpretation with the following exceptions: none I personally reviewed and interpreted the pt's EKG: see above for interpretation  I have reviewed  the patients home medications and made adjustments as needed Social Determinants of health:  Polysubstance abuse and frequent emergency department visits         Final Clinical Impression(s) / ED Diagnoses Final diagnoses:  Generalized abdominal pain  Alcohol abuse    Rx / DC Orders ED Discharge Orders     None         Rondel Baton, MD 01/30/23 1216

## 2023-01-30 ENCOUNTER — Other Ambulatory Visit: Payer: Self-pay

## 2023-01-30 ENCOUNTER — Emergency Department (HOSPITAL_COMMUNITY)
Admission: EM | Admit: 2023-01-30 | Discharge: 2023-01-30 | Disposition: A | Discharge status: Home / Self Care | Payer: 59 | Attending: Emergency Medicine | Admitting: Emergency Medicine

## 2023-01-30 DIAGNOSIS — Y904 Blood alcohol level of 80-99 mg/100 ml: Secondary | ICD-10-CM | POA: Insufficient documentation

## 2023-01-30 DIAGNOSIS — G8929 Other chronic pain: Secondary | ICD-10-CM | POA: Insufficient documentation

## 2023-01-30 DIAGNOSIS — J449 Chronic obstructive pulmonary disease, unspecified: Secondary | ICD-10-CM | POA: Insufficient documentation

## 2023-01-30 DIAGNOSIS — R109 Unspecified abdominal pain: Secondary | ICD-10-CM | POA: Diagnosis not present

## 2023-01-30 DIAGNOSIS — R0902 Hypoxemia: Secondary | ICD-10-CM | POA: Diagnosis not present

## 2023-01-30 DIAGNOSIS — Z743 Need for continuous supervision: Secondary | ICD-10-CM | POA: Diagnosis not present

## 2023-01-30 DIAGNOSIS — F1010 Alcohol abuse, uncomplicated: Secondary | ICD-10-CM | POA: Diagnosis not present

## 2023-01-30 DIAGNOSIS — R739 Hyperglycemia, unspecified: Secondary | ICD-10-CM | POA: Diagnosis not present

## 2023-01-30 DIAGNOSIS — I499 Cardiac arrhythmia, unspecified: Secondary | ICD-10-CM | POA: Diagnosis not present

## 2023-01-30 DIAGNOSIS — R1084 Generalized abdominal pain: Secondary | ICD-10-CM | POA: Diagnosis not present

## 2023-01-30 LAB — COMPREHENSIVE METABOLIC PANEL
ALT: 49 U/L — ABNORMAL HIGH (ref 0–44)
AST: 41 U/L (ref 15–41)
Albumin: 3.1 g/dL — ABNORMAL LOW (ref 3.5–5.0)
Alkaline Phosphatase: 158 U/L — ABNORMAL HIGH (ref 38–126)
Anion gap: 20 — ABNORMAL HIGH (ref 5–15)
BUN: 11 mg/dL (ref 6–20)
CO2: 19 mmol/L — ABNORMAL LOW (ref 22–32)
Calcium: 8.4 mg/dL — ABNORMAL LOW (ref 8.9–10.3)
Chloride: 96 mmol/L — ABNORMAL LOW (ref 98–111)
Creatinine, Ser: 0.65 mg/dL (ref 0.44–1.00)
GFR, Estimated: >60 mL/min (ref >60)
Glucose, Bld: 243 mg/dL — ABNORMAL HIGH (ref 70–99)
Potassium: 4.1 mmol/L (ref 3.5–5.1)
Sodium: 135 mmol/L (ref 135–145)
Total Bilirubin: 0.6 mg/dL (ref 0.3–1.2)
Total Protein: 6.3 g/dL — ABNORMAL LOW (ref 6.5–8.1)

## 2023-01-30 LAB — CBC WITH DIFFERENTIAL/PLATELET
Abs Immature Granulocytes: 0.02 10*3/uL (ref 0.00–0.07)
Basophils Absolute: 0 10*3/uL (ref 0.0–0.1)
Basophils Relative: 1 %
Eosinophils Absolute: 0 10*3/uL (ref 0.0–0.5)
Eosinophils Relative: 0 %
HCT: 30.1 % — ABNORMAL LOW (ref 36.0–46.0)
Hemoglobin: 9.4 g/dL — ABNORMAL LOW (ref 12.0–15.0)
Immature Granulocytes: 0 %
Lymphocytes Relative: 16 %
Lymphs Abs: 1 10*3/uL (ref 0.7–4.0)
MCH: 23.4 pg — ABNORMAL LOW (ref 26.0–34.0)
MCHC: 31.2 g/dL (ref 30.0–36.0)
MCV: 75.1 fL — ABNORMAL LOW (ref 80.0–100.0)
Monocytes Absolute: 0.5 10*3/uL (ref 0.1–1.0)
Monocytes Relative: 9 %
Neutro Abs: 4.4 10*3/uL (ref 1.7–7.7)
Neutrophils Relative %: 74 %
Platelets: 240 10*3/uL (ref 150–400)
RBC: 4.01 MIL/uL (ref 3.87–5.11)
RDW: 19.8 % — ABNORMAL HIGH (ref 11.5–15.5)
WBC: 6 10*3/uL (ref 4.0–10.5)
nRBC: 0 % (ref 0.0–0.2)

## 2023-01-30 LAB — ETHANOL: Alcohol, Ethyl (B): 93 mg/dL — ABNORMAL HIGH (ref <10)

## 2023-01-30 LAB — LIPASE, BLOOD: Lipase: 45 U/L (ref 11–51)

## 2023-01-30 MED ORDER — ONDANSETRON 4 MG PO TBDP
4.0000 mg | ORAL_TABLET | Freq: Once | ORAL | Status: AC
  Administered 2023-01-30: 4 mg via ORAL
  Filled 2023-01-30: qty 1

## 2023-01-30 MED ORDER — ALUM & MAG HYDROXIDE-SIMETH 200-200-20 MG/5ML PO SUSP
30.0000 mL | Freq: Once | ORAL | Status: AC
  Administered 2023-01-30: 30 mL via ORAL
  Filled 2023-01-30: qty 30

## 2023-01-30 NOTE — ED Provider Notes (Signed)
[place] EMERGENCY DEPARTMENT AT [facility] Provider Note   CSN: [id number] Arrival date & time: 01/30/23  0410     History  Chief Complaint  Patient presents with   Abdominal Pain    [name] is a 57 y.o. female.  The history is provided by the patient and medical records.  Abdominal Pain  57 y.o. F with hx of alcohol abuse, chronic pain, COPD, anemia, for abdominal pain.  Patient is seen here frequently for same.  States some nausea this evening, no vomiting.  No diarrhea.  She has been agitated and cursed at EMS en route.  Admits to 1 beer today.    Recent CT's 01/05/23 and 01/22/23-- both negative for acute findings.  Home Medications Prior to Admission medications   Medication Sig Start Date End Date Taking? Authorizing Provider  albuterol (VENTOLIN HFA) 108 (90 Base) MCG/ACT inhaler Inhale 1-2 puffs into the lungs every 4 (four) hours as needed for wheezing or shortness of breath. 12/24/22   [provider]  budesonide-formoterol (SYMBICORT) 160-4.5 MCG/ACT inhaler Inhale 2 puffs into the lungs 2 (two) times daily. Patient not taking: Reported on 01/11/2023 12/01/22   Maretta Bees, MD  famotidine (PEPCID) 20 MG tablet Take 1 tablet (20 mg total) by mouth daily. 01/24/23   Elayne Snare K, DO  hyoscyamine (LEVSIN/SL) 0.125 MG SL tablet Place 1 tablet (0.125 mg total) under the tongue every 4 (four) hours as needed (pain). Up to 1.25 mg daily 01/23/23   Arthor Captain, PA-C  metFORMIN (GLUCOPHAGE-XR) 750 MG 24 hr tablet Take 750 mg by mouth daily. Patient not taking: Reported on 01/11/2023 12/30/22   [provider]  ondansetron (ZOFRAN) 4 MG tablet Take 1 tablet (4 mg total) by mouth every 6 (six) hours. 01/20/23   Prosperi, Christian H, PA-C  ondansetron (ZOFRAN-ODT) 4 MG disintegrating tablet Take 1 tablet (4 mg total) by mouth every 8 (eight) hours as needed for nausea or vomiting. 01/23/23   Arthor Captain, PA-C  OXYGEN Inhale 4-5 L into  the lungs as needed (COPD).    [provider]  zonisamide (ZONEGRAN) 100 MG capsule Take 2 capsules (200 mg total) by mouth daily. 01/12/23   Lewie Chamber, MD      Allergies    Iron dextran, Aspirin, and Penicillins    Review of Systems   Review of Systems  Gastrointestinal:  Positive for abdominal pain.  All other systems reviewed and are negative.   Physical Exam Updated Vital Signs BP 134/85   Pulse (!) 102   Temp 99.1 F (37.3 C)   Resp 15   LMP 01/08/2011   SpO2 96%   Physical Exam Vitals and nursing note reviewed.  Constitutional:      Appearance: She is well-developed.     Comments: Hyperventilating, moaning, thrashing about, cursing at EMS and staff  HENT:     Head: Normocephalic and atraumatic.  Eyes:     Conjunctiva/sclera: Conjunctivae normal.     Pupils: Pupils are equal, round, and reactive to light.  Cardiovascular:     Rate and Rhythm: Normal rate and regular rhythm.     Heart sounds: Normal heart sounds.  Pulmonary:     Effort: Pulmonary effort is normal.     Breath sounds: Normal breath sounds.  Abdominal:     General: Bowel sounds are normal.     Palpations: Abdomen is soft.     Tenderness: There is no guarding or rebound.  Musculoskeletal:  General: Normal range of motion.     Cervical back: Normal range of motion.  Skin:    General: Skin is warm and dry.  Neurological:     Mental Status: She is alert and oriented to person, place, and time.     ED Results / Procedures / Treatments   Labs (all labs ordered are listed, but only abnormal results are displayed) Labs Reviewed  CBC WITH DIFFERENTIAL/PLATELET - Abnormal; Notable for the following components:      Result Value   Hemoglobin 9.4 (*)    HCT 30.1 (*)    MCV 75.1 (*)    MCH 23.4 (*)    RDW 19.8 (*)    All other components within normal limits  COMPREHENSIVE METABOLIC PANEL - Abnormal; Notable for the following components:   Chloride 96 (*)    CO2 19 (*)     Glucose, Bld 243 (*)    Calcium 8.4 (*)    Total Protein 6.3 (*)    Albumin 3.1 (*)    ALT 49 (*)    Alkaline Phosphatase 158 (*)    Anion gap 20 (*)    All other components within normal limits  ETHANOL - Abnormal; Notable for the following components:   Alcohol, Ethyl (B) 93 (*)    All other components within normal limits  LIPASE, BLOOD    EKG None  Radiology DG Abdomen 1 View  Result Date: 01/29/2023 CLINICAL DATA:  Abdominal pain for a few days, worsened. obtain upright or decub to assess for sbo EXAM: ABDOMEN - 1 VIEW COMPARISON:  CT 01/22/2023 FINDINGS: View obtained standing. Few air-fluid levels within bowel in the central abdomen, likely colon. No significant formed stool. No evidence of free intra-abdominal air, although the diaphragms are not included in the field of view. 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 Clothing artifact over the pelvis. Presumed lighter projects over the low left groin. IMPRESSION: Few air-fluid levels within bowel in the central abdomen, likely colon, nonspecific. No free air or bowel obstruction on this single view. Electronically Signed   By: Narda Rutherford M.D.   On: 01/29/2023 17:35    Procedures Procedures    Medications Ordered in ED Medications  ondansetron (ZOFRAN-ODT) disintegrating tablet 4 mg (4 mg Oral Given 01/30/23 0420)  alum & mag hydroxide-simeth (MAALOX/MYLANTA) 200-200-20 MG/5ML suspension 30 mL (30 mLs Oral Given 01/30/23 0420)    ED Course/ Medical Decision Making/ A&P                                 Medical Decision Making Amount and/or Complexity of Data Reviewed Labs: ordered. ECG/medicine tests: ordered and independent interpretation performed.  Risk OTC drugs. Prescription drug management.   56 y.o. F here with abdominal pain, seen here quite frequently for the same including just last night.  She has had recent CT 01/05/23 and 01/22/23 without acute findings.  She appears intoxicated, states only had 1  beer.  She is moaning and cursing.  VSS on arrival.  Will check labs.  4:54 AM Patient continues pacing halls, cursing, refusing to stay in her room.  She continues stating "I need water".  She was given water and escorted back to room as she is disrupting other patients.  Labs returned-- no leukocytosis.  She has minor electrolyte abnormalities, very similar to prior.  Gap is 20, bicarb minimally low at 19 but glucose is only 243.  Do not  feel this is DKA, suspect a lot of this is alcohol induced.  She was also hyperventilating upon arrival.  Ethanol 93 today.  She has not had any nausea/vomiting here, she is tolerating PO-- drank several cups of water actually.  Continues wandering halls and calling people on her phone to come pick her up.  Feel she is stable for discharge home.  She was encouraged to curb her drinking, monitor glucose at home and follow-up with PCP.  Can return here for new concerns.  Final Clinical Impression(s) / ED Diagnoses Final diagnoses:  Alcohol abuse  Chronic abdominal pain    Rx / DC Orders ED Discharge Orders     None         Garlon Hatchet, PA-C 01/30/23 0551    Glynn Octave, MD 01/30/23 [phone]

## 2023-01-30 NOTE — ED Triage Notes (Signed)
Pt arrives EMS from home with generalized abdominal pain that began tonight. Pt endorses nausea as well.

## 2023-01-30 NOTE — Discharge Instructions (Signed)
Continue your home medications. Follow-up with your doctor. Return here for new concerns.

## 2023-01-31 ENCOUNTER — Emergency Department (HOSPITAL_COMMUNITY)
Admission: EM | Admit: 2023-01-31 | Discharge: 2023-01-31 | Disposition: A | Discharge status: Home / Self Care | Payer: 59 | Attending: Emergency Medicine | Admitting: Emergency Medicine

## 2023-01-31 ENCOUNTER — Emergency Department (HOSPITAL_COMMUNITY)
Admission: EM | Admit: 2023-01-31 | Discharge: 2023-01-31 | Discharge status: Against Medical Advice | Payer: 59 | Attending: Emergency Medicine | Admitting: Emergency Medicine

## 2023-01-31 ENCOUNTER — Other Ambulatory Visit: Payer: Self-pay

## 2023-01-31 DIAGNOSIS — I10 Essential (primary) hypertension: Secondary | ICD-10-CM | POA: Insufficient documentation

## 2023-01-31 DIAGNOSIS — Z7984 Long term (current) use of oral hypoglycemic drugs: Secondary | ICD-10-CM | POA: Diagnosis not present

## 2023-01-31 DIAGNOSIS — Z5321 Procedure and treatment not carried out due to patient leaving prior to being seen by health care provider: Secondary | ICD-10-CM | POA: Insufficient documentation

## 2023-01-31 DIAGNOSIS — J449 Chronic obstructive pulmonary disease, unspecified: Secondary | ICD-10-CM | POA: Insufficient documentation

## 2023-01-31 DIAGNOSIS — R109 Unspecified abdominal pain: Secondary | ICD-10-CM | POA: Diagnosis not present

## 2023-01-31 DIAGNOSIS — Z743 Need for continuous supervision: Secondary | ICD-10-CM | POA: Diagnosis not present

## 2023-01-31 DIAGNOSIS — Y907 Blood alcohol level of 200-239 mg/100 ml: Secondary | ICD-10-CM | POA: Insufficient documentation

## 2023-01-31 DIAGNOSIS — E119 Type 2 diabetes mellitus without complications: Secondary | ICD-10-CM | POA: Insufficient documentation

## 2023-01-31 DIAGNOSIS — R531 Weakness: Secondary | ICD-10-CM | POA: Diagnosis not present

## 2023-01-31 DIAGNOSIS — R110 Nausea: Secondary | ICD-10-CM | POA: Diagnosis not present

## 2023-01-31 DIAGNOSIS — K2920 Alcoholic gastritis without bleeding: Secondary | ICD-10-CM | POA: Diagnosis not present

## 2023-01-31 DIAGNOSIS — R1084 Generalized abdominal pain: Secondary | ICD-10-CM | POA: Insufficient documentation

## 2023-01-31 LAB — COMPREHENSIVE METABOLIC PANEL
ALT: 40 U/L (ref 0–44)
AST: 39 U/L (ref 15–41)
Albumin: 3.1 g/dL — ABNORMAL LOW (ref 3.5–5.0)
Alkaline Phosphatase: 155 U/L — ABNORMAL HIGH (ref 38–126)
Anion gap: 13 (ref 5–15)
BUN: 15 mg/dL (ref 6–20)
CO2: 20 mmol/L — ABNORMAL LOW (ref 22–32)
Calcium: 7.8 mg/dL — ABNORMAL LOW (ref 8.9–10.3)
Chloride: 98 mmol/L (ref 98–111)
Creatinine, Ser: 0.6 mg/dL (ref 0.44–1.00)
GFR, Estimated: >60 mL/min (ref >60)
Glucose, Bld: 191 mg/dL — ABNORMAL HIGH (ref 70–99)
Potassium: 4.2 mmol/L (ref 3.5–5.1)
Sodium: 131 mmol/L — ABNORMAL LOW (ref 135–145)
Total Bilirubin: 1 mg/dL (ref 0.3–1.2)
Total Protein: 6.2 g/dL — ABNORMAL LOW (ref 6.5–8.1)

## 2023-01-31 LAB — ETHANOL: Alcohol, Ethyl (B): 223 mg/dL — ABNORMAL HIGH (ref <10)

## 2023-01-31 LAB — CBC
HCT: 30.7 % — ABNORMAL LOW (ref 36.0–46.0)
Hemoglobin: 9.6 g/dL — ABNORMAL LOW (ref 12.0–15.0)
MCH: 23.5 pg — ABNORMAL LOW (ref 26.0–34.0)
MCHC: 31.3 g/dL (ref 30.0–36.0)
MCV: 75.2 fL — ABNORMAL LOW (ref 80.0–100.0)
Platelets: 266 10*3/uL (ref 150–400)
RBC: 4.08 MIL/uL (ref 3.87–5.11)
RDW: 19.9 % — ABNORMAL HIGH (ref 11.5–15.5)
WBC: 9.5 10*3/uL (ref 4.0–10.5)
nRBC: 0 % (ref 0.0–0.2)

## 2023-01-31 LAB — LIPASE, BLOOD: Lipase: 49 U/L (ref 11–51)

## 2023-01-31 MED ORDER — ALUM & MAG HYDROXIDE-SIMETH 200-200-20 MG/5ML PO SUSP
30.0000 mL | Freq: Once | ORAL | Status: AC
  Administered 2023-01-31: 30 mL via ORAL
  Filled 2023-01-31: qty 30

## 2023-01-31 NOTE — ED Provider Notes (Signed)
[facility] AT [facility] Provider Note   CSN: [id number] Arrival date & time: 01/31/23  1159     History  Chief Complaint  Patient presents with   Abdominal Pain    [name] is a 57 y.o. female with history of alcohol abuse, chronic pain, COPD, schizoaffective disorder, type 2 diabetes, HTN, anemia, chronic abdominal pain.  Patient is seen here frequently for abdominal pain concerns.  She states this morning she is having some nausea had 1 episode of diarrhea earlier.  This was nonbloody.  She denies any episodes of emesis, fever or chills, chest pain, recent travel.  She notes some shortness of breath which is consistent with her baseline COPD.     Patient was seen twice in the past 2 days in the ER for abdominal concerns.  Recent CT abdomen pelvis on 01/05/2023 and 01/22/2023 without any acute findings.  Recent abdominal x-ray from 01/29/2023 with no acute findings.  Abdominal Pain      Home Medications Prior to Admission medications   Medication Sig Start Date End Date Taking? Authorizing Provider  albuterol (VENTOLIN HFA) 108 (90 Base) MCG/ACT inhaler Inhale 1-2 puffs into the lungs every 4 (four) hours as needed for wheezing or shortness of breath. 12/24/22   [provider]  budesonide-formoterol (SYMBICORT) 160-4.5 MCG/ACT inhaler Inhale 2 puffs into the lungs 2 (two) times daily. Patient not taking: Reported on 01/11/2023 12/01/22   Maretta Bees, MD  famotidine (PEPCID) 20 MG tablet Take 1 tablet (20 mg total) by mouth daily. 01/24/23   Elayne Snare K, DO  hyoscyamine (LEVSIN/SL) 0.125 MG SL tablet Place 1 tablet (0.125 mg total) under the tongue every 4 (four) hours as needed (pain). Up to 1.25 mg daily 01/23/23   Arthor Captain, PA-C  metFORMIN (GLUCOPHAGE-XR) 750 MG 24 hr tablet Take 750 mg by mouth daily. Patient not taking: Reported on 01/11/2023 12/30/22   [provider]  ondansetron (ZOFRAN) 4 MG tablet Take 1  tablet (4 mg total) by mouth every 6 (six) hours. 01/20/23   Prosperi, Christian H, PA-C  ondansetron (ZOFRAN-ODT) 4 MG disintegrating tablet Take 1 tablet (4 mg total) by mouth every 8 (eight) hours as needed for nausea or vomiting. 01/23/23   Arthor Captain, PA-C  OXYGEN Inhale 4-5 L into the lungs as needed (COPD).    [provider]  zonisamide (ZONEGRAN) 100 MG capsule Take 2 capsules (200 mg total) by mouth daily. 01/12/23   Lewie Chamber, MD      Allergies    Iron dextran, Aspirin, and Penicillins    Review of Systems   Review of Systems  Gastrointestinal:  Positive for abdominal pain.    Physical Exam Updated Vital Signs BP 126/72   Pulse 81   Temp 98.3 F (36.8 C) (Oral)   Resp 20   Ht 5\' 2"  (1.575 m)   Wt 50 kg   LMP 01/08/2011   SpO2 100%   BMI 20.16 kg/m  Physical Exam Vitals and nursing note reviewed.  Constitutional:      General: She is not in acute distress.    Appearance: She is well-developed.  HENT:     Head: Normocephalic and atraumatic.  Eyes:     Conjunctiva/sclera: Conjunctivae normal.  Cardiovascular:     Rate and Rhythm: Normal rate and regular rhythm.     Heart sounds: No murmur heard. Pulmonary:     Effort: Pulmonary effort is normal. No respiratory distress.  Comments: Diffuse wheezing in the lung fields bilaterally Abdominal:     General: Abdomen is flat. Bowel sounds are normal.     Palpations: Abdomen is soft.     Tenderness: There is no abdominal tenderness.     Comments: No tenderness palpation of the abdomen No rebound or guarding, negative Murphy sign, negative Rovsing's, no McBurney's point tenderness  Musculoskeletal:        General: No swelling.     Cervical back: Neck supple.  Skin:    General: Skin is warm and dry.     Capillary Refill: Capillary refill takes less than 2 seconds.     Comments: Surgical scars of the abdomen present, well-healed No obvious hernias No overlying rash or erythema of the abdomen   Neurological:     Mental Status: She is alert.  Psychiatric:        Mood and Affect: Mood normal.     ED Results / Procedures / Treatments   Labs (all labs ordered are listed, but only abnormal results are displayed) Labs Reviewed  CBC - Abnormal; Notable for the following components:      Result Value   Hemoglobin 9.6 (*)    HCT 30.7 (*)    MCV 75.2 (*)    MCH 23.5 (*)    RDW 19.9 (*)    All other components within normal limits  COMPREHENSIVE METABOLIC PANEL - Abnormal; Notable for the following components:   Sodium 131 (*)    CO2 20 (*)    Glucose, Bld 191 (*)    Calcium 7.8 (*)    Total Protein 6.2 (*)    Albumin 3.1 (*)    Alkaline Phosphatase 155 (*)    All other components within normal limits  ETHANOL - Abnormal; Notable for the following components:   Alcohol, Ethyl (B) 223 (*)    All other components within normal limits  LIPASE, BLOOD    EKG None  Radiology DG Abdomen 1 View  Result Date: 01/29/2023 CLINICAL DATA:  Abdominal pain for a few days, worsened. obtain upright or decub to assess for sbo EXAM: ABDOMEN - 1 VIEW COMPARISON:  CT 01/22/2023 FINDINGS: View obtained standing. Few air-fluid levels within bowel in the central abdomen, likely colon. No significant formed stool. No evidence of free intra-abdominal air, although the diaphragms are not included in the field of view. 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 Clothing artifact over the pelvis. Presumed lighter projects over the low left groin. IMPRESSION: Few air-fluid levels within bowel in the central abdomen, likely colon, nonspecific. No free air or bowel obstruction on this single view. Electronically Signed   By: Narda Rutherford M.D.   On: 01/29/2023 17:35    Procedures Procedures    Medications Ordered in ED Medications  alum & mag hydroxide-simeth (MAALOX/MYLANTA) 200-200-20 MG/5ML suspension 30 mL (30 mLs Oral Given 01/31/23 1233)    ED Course/ Medical Decision Making/ A&P                                  Medical Decision Making  57 y.o. female with pertinent past medical history of alcohol abuse, chronic pain, COPD, schizoaffective disorder, type 2 diabetes, HTN, anemia, small bowel obstruction,  presents to the ED for concern of nausea   Differential diagnosis includes but is not limited to   ED Course:  Patient overall well-appearing with normal vital signs except for slightly elevated blood pressure upon  arrival at 135/97.  She comes in with concern for some nausea this morning.  She is seen frequently in the ER for abdominal complaints. Patient was seen twice in the past 2 days in the ER for abdominal concerns.  Recent CT abdomen pelvis on 01/05/2023 and 01/22/2023 without any acute findings.  Recent abdominal x-ray from 01/29/2023 with no acute findings. She is found to have an elevated serum alcohol at 223, suspect this may be contributing to her nausea today.  She does not have any findings outside of her baseline on CMP or CBC.  Lipase within normal limits Do not feel we need to repeat CT abdomen pelvis today given recent scan on 8/1 and abdominal x ray on 8/8 without any concerning findings, and no acute worsening of symptoms and benign exam. She is still having bowel movements which lessens concern for SBO 1:21 PM Upon re-evaluation, patient comfortable in bed with all vital signs within normal limits.  I spoke to daughter at bedside who states patient has been having more abdominal pain in the last week.  She also states that her facial puffiness is not normal and that started within the past 2 or 3 days. Discussed this patient with Dr. Lynelle Doctor, feel this patient is appropriate for discharge at this time as she has no acute worsening, labs at patient baseline, nausea likely due to alcoholic gastritis. No need for further imaging at this time.  She is advised to decrease alcohol intake and visit PCP to discuss further management with help  quitting.  Impression: Alcoholic gastritis  Disposition:  Patient discharged home with instructions to decrease alcohol intake and take Zofran as needed for nausea.  She was prescribed this previous ER visit and still has some at home.  She is encouraged to follow-up with PCP to discuss further management for alcohol use disorder. Return precautions given.  Lab Tests: I Ordered, and personally interpreted labs.  The pertinent results include:   Ethanol level at 223 CBC with no leukocytosis CMP with no elevation LFTs, lipase within normal limits     Cardiac Monitoring: / EKG: The patient was maintained on a cardiac monitor.  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

## 2023-01-31 NOTE — Discharge Instructions (Addendum)
Your nausea today is likely due to your alcohol use.   We encourage you to decrease your alcohol intake. Please talk to your PCP regarding strategies to help you to stop drinking.   Please continue to use the Zofran you were prescribed recently for your nausea.  Return to the ER if you have uncontrolled vomiting or diarrhea, blood in your stool or vomit, black stools, you have severe abdominal pain, you become very dizzy, you experience chest pain or shortness of breath.

## 2023-01-31 NOTE — ED Triage Notes (Addendum)
Pt here via [facility] from home for abd pain.  Seen multiple times in the past week for same and tx with zofran.  1 episode of diarrhea today.  Pt states she drank 2 40 oz beers yesterday and thinks she may have used cocaine.  132/80 Hr 86 Spo2 98 Cbg 245

## 2023-01-31 NOTE — ED Triage Notes (Signed)
BIBA- generalized abd pain started today. Seen at [facility] home. ETOH and crack cocaine on board per EMS. Pt states the medication she was prescribed didn't help but isnt sure what it is. Pt is dry heaving in triage.   EMS vitals BP 104/68 HR 86  SPO2 95% RA  CBG 219

## 2023-01-31 NOTE — ED Notes (Signed)
Pt. Called to triage for lab draw but no answer.

## 2023-02-01 ENCOUNTER — Other Ambulatory Visit: Payer: Self-pay

## 2023-02-01 ENCOUNTER — Emergency Department (HOSPITAL_COMMUNITY)
Admission: EM | Admit: 2023-02-01 | Discharge: 2023-02-01 | Disposition: A | Discharge status: Home / Self Care | Payer: 59 | Source: Home / Self Care | Attending: Emergency Medicine | Admitting: Emergency Medicine

## 2023-02-01 ENCOUNTER — Encounter (HOSPITAL_COMMUNITY): Payer: Self-pay

## 2023-02-01 DIAGNOSIS — E039 Hypothyroidism, unspecified: Secondary | ICD-10-CM | POA: Insufficient documentation

## 2023-02-01 DIAGNOSIS — E1140 Type 2 diabetes mellitus with diabetic neuropathy, unspecified: Secondary | ICD-10-CM | POA: Insufficient documentation

## 2023-02-01 DIAGNOSIS — E119 Type 2 diabetes mellitus without complications: Secondary | ICD-10-CM | POA: Insufficient documentation

## 2023-02-01 DIAGNOSIS — Z7951 Long term (current) use of inhaled steroids: Secondary | ICD-10-CM | POA: Insufficient documentation

## 2023-02-01 DIAGNOSIS — J449 Chronic obstructive pulmonary disease, unspecified: Secondary | ICD-10-CM | POA: Insufficient documentation

## 2023-02-01 DIAGNOSIS — K2900 Acute gastritis without bleeding: Secondary | ICD-10-CM | POA: Insufficient documentation

## 2023-02-01 DIAGNOSIS — I10 Essential (primary) hypertension: Secondary | ICD-10-CM | POA: Insufficient documentation

## 2023-02-01 DIAGNOSIS — Z7984 Long term (current) use of oral hypoglycemic drugs: Secondary | ICD-10-CM | POA: Insufficient documentation

## 2023-02-01 DIAGNOSIS — K2920 Alcoholic gastritis without bleeding: Secondary | ICD-10-CM | POA: Insufficient documentation

## 2023-02-01 DIAGNOSIS — R1111 Vomiting without nausea: Secondary | ICD-10-CM | POA: Diagnosis not present

## 2023-02-01 DIAGNOSIS — R1084 Generalized abdominal pain: Secondary | ICD-10-CM

## 2023-02-01 DIAGNOSIS — Z96641 Presence of right artificial hip joint: Secondary | ICD-10-CM | POA: Insufficient documentation

## 2023-02-01 DIAGNOSIS — Z743 Need for continuous supervision: Secondary | ICD-10-CM | POA: Diagnosis not present

## 2023-02-01 DIAGNOSIS — R109 Unspecified abdominal pain: Secondary | ICD-10-CM | POA: Diagnosis not present

## 2023-02-01 MED ORDER — PANTOPRAZOLE SODIUM 40 MG PO TBEC
40.0000 mg | DELAYED_RELEASE_TABLET | Freq: Once | ORAL | Status: AC
  Administered 2023-02-01: 40 mg via ORAL
  Filled 2023-02-01: qty 1

## 2023-02-01 MED ORDER — ALUM & MAG HYDROXIDE-SIMETH 200-200-20 MG/5ML PO SUSP
30.0000 mL | Freq: Once | ORAL | Status: AC
  Administered 2023-02-01: 30 mL via ORAL
  Filled 2023-02-01: qty 30

## 2023-02-01 MED ORDER — LIDOCAINE VISCOUS HCL 2 % MT SOLN
15.0000 mL | Freq: Once | OROMUCOSAL | Status: AC
  Administered 2023-02-01: 15 mL via ORAL
  Filled 2023-02-01: qty 15

## 2023-02-01 MED ORDER — FAMOTIDINE 20 MG PO TABS
40.0000 mg | ORAL_TABLET | Freq: Once | ORAL | Status: AC
  Administered 2023-02-01: 40 mg via ORAL
  Filled 2023-02-01: qty 2

## 2023-02-01 MED ORDER — FAMOTIDINE 20 MG PO TABS: 20.0000 mg | ORAL_TABLET | Freq: Two times a day (BID) | ORAL | 0 refills | Status: DC

## 2023-02-01 NOTE — ED Provider Notes (Signed)
[facility] AT [facility] Provider Note   CSN: [id number] Arrival date & time: 02/01/23  1004     History  Chief Complaint  Patient presents with   Abdominal Pain    [name] is a 57 y.o. female history of alcohol abuse, chronic abdominal pain, COPD, schizoaffective disorder, diabetes, hypertension, anemia presented for her chronic abdominal pain.  Patient has had multiple CTs and x-rays in the past that have been reassuring and was seen yesterday for the same chief concern.  Patient states that since being discharged she has not had any changes in symptoms and has been able to eat have bowel movements and urinate without issue.  Patient states that she is slightly nauseous right now and has her normal abdominal pain and wants to be treated with a GI cocktail.  Patient states that since she was seen yesterday she does not see the need for labs or any imaging.  Patient denied hematochezia, diarrhea, constipation, dysuria, fevers, chest pain, shortness of breath, skin color changes, hematemesis, emesis  Home Medications Prior to Admission medications   Medication Sig Start Date End Date Taking? Authorizing Provider  albuterol (VENTOLIN HFA) 108 (90 Base) MCG/ACT inhaler Inhale 1-2 puffs into the lungs every 4 (four) hours as needed for wheezing or shortness of breath. 12/24/22   [provider]  budesonide-formoterol (SYMBICORT) 160-4.5 MCG/ACT inhaler Inhale 2 puffs into the lungs 2 (two) times daily. Patient not taking: Reported on 01/11/2023 12/01/22   Maretta Bees, MD  famotidine (PEPCID) 20 MG tablet Take 1 tablet (20 mg total) by mouth daily. 01/24/23   Elayne Snare K, DO  hyoscyamine (LEVSIN/SL) 0.125 MG SL tablet Place 1 tablet (0.125 mg total) under the tongue every 4 (four) hours as needed (pain). Up to 1.25 mg daily 01/23/23   Arthor Captain, PA-C  metFORMIN (GLUCOPHAGE-XR) 750 MG 24 hr tablet Take 750 mg by mouth daily. Patient  not taking: Reported on 01/11/2023 12/30/22   [provider]  ondansetron (ZOFRAN) 4 MG tablet Take 1 tablet (4 mg total) by mouth every 6 (six) hours. 01/20/23   Prosperi, Christian H, PA-C  ondansetron (ZOFRAN-ODT) 4 MG disintegrating tablet Take 1 tablet (4 mg total) by mouth every 8 (eight) hours as needed for nausea or vomiting. 01/23/23   Arthor Captain, PA-C  OXYGEN Inhale 4-5 L into the lungs as needed (COPD).    [provider]  zonisamide (ZONEGRAN) 100 MG capsule Take 2 capsules (200 mg total) by mouth daily. 01/12/23   Lewie Chamber, MD      Allergies    Iron dextran, Aspirin, and Penicillins    Review of Systems   Review of Systems  Gastrointestinal:  Positive for abdominal pain.    Physical Exam Updated Vital Signs BP 106/74   Pulse 80   Temp 98 F (36.7 C) (Oral)   Resp 18   LMP 01/08/2011   SpO2 100%  Physical Exam Vitals reviewed.  Constitutional:      General: She is not in acute distress. HENT:     Head: Normocephalic and atraumatic.  Eyes:     Extraocular Movements: Extraocular movements intact.     Conjunctiva/sclera: Conjunctivae normal.     Pupils: Pupils are equal, round, and reactive to light.  Cardiovascular:     Rate and Rhythm: Normal rate and regular rhythm.     Pulses: Normal pulses.     Heart sounds: Normal heart sounds.     Comments: 2+  bilateral radial/dorsalis pedis pulses with regular rate Pulmonary:     Effort: Pulmonary effort is normal. No respiratory distress.     Breath sounds: Normal breath sounds.  Abdominal:     Palpations: Abdomen is soft.     Tenderness: There is no abdominal tenderness. There is no guarding or rebound. Negative signs include Murphy's sign, Rovsing's sign, McBurney's sign and psoas sign.     Hernia: No hernia is present.  Musculoskeletal:        General: Normal range of motion.     Cervical back: Normal range of motion and neck supple.     Comments: 5 out of 5 bilateral grip/leg extension  strength  Skin:    General: Skin is warm and dry.     Capillary Refill: Capillary refill takes less than 2 seconds.  Neurological:     General: No focal deficit present.     Mental Status: She is alert and oriented to person, place, and time.     Comments: Sensation intact in all 4 limbs  Psychiatric:        Mood and Affect: Mood normal.     ED Results / Procedures / Treatments   Labs (all labs ordered are listed, but only abnormal results are displayed) Labs Reviewed - No data to display  EKG None  Radiology No results found.  Procedures Procedures    Medications Ordered in ED Medications  alum & mag hydroxide-simeth (MAALOX/MYLANTA) 200-200-20 MG/5ML suspension 30 mL (has no administration in time range)    And  lidocaine (XYLOCAINE) 2 % viscous mouth solution 15 mL (has no administration in time range)    ED Course/ Medical Decision Making/ A&P                                 Medical Decision Making Risk OTC drugs. Prescription drug management.   [name] 57 y.o. presented today for abdominal pain. Working DDx that I considered at this time includes, but not limited to, chronic abdominal pain ,gastroenteritis, colitis, small bowel obstruction, appendicitis, cholecystitis, pancreatitis, nephrolithiasis, AAA, UTI, pyelonephritis, ruptured ectopic pregnancy, PID, ovarian torsion.  R/o DDx: gastroenteritis, colitis, small bowel obstruction, appendicitis, cholecystitis, pancreatitis, nephrolithiasis, AAA, UTI, pyelonephritis, ruptured ectopic pregnancy, PID, ovarian torsion: These are considered less likely due to history of present illness and physical exam findings.  Review of prior external notes: 01/31/23 ED  Unique Tests and My Interpretation: none  Discussion with Independent Historian: None  Discussion of Management of Tests: None  Risk: Medium: prescription drug management  Risk Stratification Score: none  Plan: On exam patient was in no  acute distress stable vitals.  Patient's physical exam was largely unremarkable as patient did not have any abdominal tenderness or peritoneal signs noted.  Patient has been seen multiple times in the past for the same chief concern and states that since being discharged yesterday she is endorsing the same symptoms with no new features.  Patient states that she does not feel she needs labs at this time as she had labs drawn yesterday and given patient's history of CT scans and x-rays being reassuring we had shared decision making and decided to forego any imaging today as patient's symptoms are similar to her chronic abdominal pain.  Patient stated that she wanted to have a GI cocktail and be discharged which is reasonable at this time given patient's history, presentation.  Patient be given GI cocktail and encouraged  to follow-up with primary care.  Patient was given return precautions. Patient stable for discharge at this time.  Patient verbalized understanding of plan.         Final Clinical Impression(s) / ED Diagnoses Final diagnoses:  Chronic alcoholic gastritis without hemorrhage    Rx / DC Orders ED Discharge Orders     None         Remi Deter 02/01/23 1216    Linwood Dibbles, MD 02/02/23 [phone]

## 2023-02-01 NOTE — ED Triage Notes (Addendum)
Pt arrived via [facility] for c/o generalized abdominal pain and nausea. Pt was just D/C'd this morning for the same.

## 2023-02-01 NOTE — Discharge Instructions (Signed)
Please follow up with your primary care provider regarding recent ER visit and symptoms.  Please take your medications as prescribed and if symptoms change or worsen please return to ER.

## 2023-02-01 NOTE — ED Provider Notes (Signed)
[place] EMERGENCY DEPARTMENT AT [facility] Provider Note  MDM   HPI/ROS:  [name] is a 57 y.o. female with history of alcohol abuse, chronic abdominal pain, schizoaffective disorder, diabetes, prior gastric bypass surgery presenting with chief complaint of acid reflux.  Patient claims her stomach has been "all messed up all day."  She has not thrown up but is felt like she is going to throw up all day.  Denies fevers, hematemesis, melena/hematochezia.  Patient has been seen in this emergency department multiple times over the past few days and received multiple abdominal CT scans that have been unrevealing.  Patient also had labs this morning that were unrevealing.  She was discharged in stable condition.  Physical exam is notable for: - Overall well-appearing, no acute distress - Cardiopulmonary exam within normal limits - No significant abdominal tenderness to palpation  On my initial evaluation, patient is:  -Vital signs stable. Patient afebrile, hemodynamically stable, and non-toxic appearing. -Additional history obtained from chart review   Clinical Course as of 02/01/23 2341  Sun Feb 01, 2023  1811 Acute on chronic abdominal pain.  Seen discharges morning hemodynamically stable no acute distress.  Exam benign once again.  Plan for follow-up in outpatient setting. [CC]  1812 34 visits in 6 months for similar. [CC]  1908 I evaluated [name] at bedside.  Acute on chronic abdominal pain.  Has been seen 34 times in 6 months for similar syndrome.  Was treated with p.o. medications and now completely resolved.  Likely gastritis in setting alcohol use, recommended follow-up with PCP gave her resources for outpatient psychiatric and substance use treatment.  Hypotension has resolved on exam at bedside. [CC]    Clinical Course User Index [CC] Glyn Ade, MD     Given the patient just received full workup this morning and is received extensive abdominal imaging as  of recently, additional workup deferred at this time.  She was given GI cocktail with omeprazole, famotidine, Maalox and viscous lidocaine.  Upon reassessment, patient resting comfortably with no new symptoms.  Vital signs remained stable within normal limits.  Abdominal exam remains benign.  Likely represents gastritis versus gastroenteritis versus GERD.  Exceedingly low suspicion for acute or life-threatening intra-abdominal process considering vital signs are stable, patient has been evaluated on multiple occasions over the past few weeks with negative workup, and patient has no new symptoms from prior.  Given that patient's symptoms are unchanged, vital signs of remained stable, and there is no necessity for further workup, discharged in stable condition.  Instructed follow-up with primary care provider.  Strict return precautions voiced.  No additional concerns at this time.  Disposition:  I discussed the plan for discharge with the patient and/or their surrogate at bedside prior to discharge and they were in agreement with the plan and verbalized understanding of the return precautions provided. All questions answered to the best of my ability. Ultimately, the patient was discharged in stable condition with stable vital signs. I am reassured that they are capable of close follow up and good social support at home.   Clinical Impression:  1. Generalized abdominal pain   2. Acute gastritis without hemorrhage, unspecified gastritis type     Rx / DC Orders ED Discharge Orders          Ordered    famotidine (PEPCID) 20 MG tablet  2 times daily        02/01/23 1908  The plan for this patient was discussed with Dr. Doran Durand, who voiced agreement and who oversaw evaluation and treatment of this patient.   Clinical Complexity A medically appropriate history, review of systems, and physical exam was performed.  My independent interpretations of EKG, labs, and radiology are  documented in the ED course above.   Click here for ABCD2, HEART and other calculatorsREFRESH Note before signing   Patient's presentation is most consistent with acute, uncomplicated illness.  Medical Decision Making Risk OTC drugs. Prescription drug management.    HPI/ROS      See MDM section for pertinent HPI and ROS. A complete ROS was performed with pertinent positives/negatives noted above.   Past Medical History:  Diagnosis Date   Abdominal pain    Accidental drug overdose April 2013   Anxiety    Atrial fibrillation (HCC) 09/29/11   converted spontaneously   Chronic back pain    Chronic knee pain    Chronic nausea    Chronic pain    COPD (chronic obstructive pulmonary disease) (HCC)    Depression    Diabetes mellitus    states her doctor took her off all DM meds in past month   Diabetic neuropathy (HCC)    Dyspnea    with exertion    GERD (gastroesophageal reflux disease)    Headache(784.0)    migraines    HTN (hypertension)    not on meds since in a year    Hyperlipidemia    Hypothyroidism    not on meds in a while    Mental disorder    Bipolar and schizophrenic   Nausea and vomiting 01/02/2023   Requires supplemental oxygen    as needed per patient    Schizophrenia (HCC)    Schizophrenia, acute (HCC) 11/13/2017   Tobacco abuse     Past Surgical History:  Procedure Laterality Date   ABDOMINAL HYSTERECTOMY     BLADDER SUSPENSION  03/04/2011   Procedure: [facility] PROCEDURE;  Surgeon: Jonna Coup MacDiarmid;  Location: [facility] ORS;  Service: Urology;  Laterality: N/A;   BOWEL RESECTION N/A 04/18/2022   Procedure: SMALL BOWEL RESECTION;  Surgeon: Harriette Bouillon, MD;  Location: [facility] OR;  Service: General;  Laterality: N/A;   CYSTOCELE REPAIR  03/04/2011   Procedure: ANTERIOR REPAIR (CYSTOCELE);  Surgeon: Martina Sinner;  Location: [facility] ORS;  Service: Urology;  Laterality: N/A;   CYSTOSCOPY  03/04/2011   Procedure: CYSTOSCOPY;  Surgeon: Jonna Coup MacDiarmid;  Location: [facility]  ORS;  Service: Urology;  Laterality: N/A;   ESOPHAGOGASTRODUODENOSCOPY (EGD) WITH PROPOFOL N/A 05/12/2017   Procedure: ESOPHAGOGASTRODUODENOSCOPY (EGD) WITH PROPOFOL;  Surgeon: Ovidio Kin, MD;  Location: [facility];  Service: General;  Laterality: N/A;   GASTRIC ROUX-EN-Y N/A 03/25/2016   Procedure: LAPAROSCOPIC ROUX-EN-Y GASTRIC BYPASS WITH UPPER ENDOSCOPY;  Surgeon: Glenna Fellows, MD;  Location: [facility] ORS;  Service: General;  Laterality: N/A;   KNEE SURGERY     LAPAROSCOPIC ASSISTED VAGINAL HYSTERECTOMY  03/04/2011   Procedure: LAPAROSCOPIC ASSISTED VAGINAL HYSTERECTOMY;  Surgeon: Jeani Hawking, MD;  Location: [facility]S;  Service: Gynecology;  Laterality: N/A;   LAPAROTOMY N/A 04/18/2022   Procedure: EXPLORATORY LAPAROTOMY;  Surgeon: Harriette Bouillon, MD;  Location: [facility] OR;  Service: General;  Laterality: N/A;   LAPAROTOMY N/A 04/24/2022   Procedure: BRING BACK EXPLORATORY LAPAROTOMY;  Surgeon: Violeta Gelinas, MD;  Location: [facility] OR;  Service: General;  Laterality: N/A;   TOTAL HIP ARTHROPLASTY Right 08/27/2022   Procedure: TOTAL HIP ARTHROPLASTY;  Surgeon: Joen Laura,  MD;  Location: [facility] OR;  Service: Orthopedics;  Laterality: Right;      Physical Exam   Vitals:   02/01/23 1653 02/01/23 1659 02/01/23 1810 02/01/23 1919  BP: 127/80  (!) 91/52 (!) 144/78  Pulse: 95  88 83  Resp: 20  16 19   Temp: 97.9 F (36.6 C)  98.1 F (36.7 C)   TempSrc: Oral  Oral   SpO2: 98%  100% 100%  Weight:  50 kg    Height:  5\' 2"  (1.575 m)      Physical Exam Vitals and nursing note reviewed.  Constitutional:      General: She is not in acute distress.    Appearance: She is well-developed.  HENT:     Head: Normocephalic and atraumatic.  Eyes:     Conjunctiva/sclera: Conjunctivae normal.  Cardiovascular:     Rate and Rhythm: Normal rate and regular rhythm.     Heart sounds: No murmur heard. Pulmonary:     Effort: Pulmonary effort is normal. No respiratory distress.     Breath sounds:  Normal breath sounds.  Abdominal:     Palpations: Abdomen is soft.     Tenderness: There is no abdominal tenderness.  Musculoskeletal:        General: No swelling.     Cervical back: Neck supple.  Skin:    General: Skin is warm and dry.     Capillary Refill: Capillary refill takes less than 2 seconds.  Neurological:     Mental Status: She is alert.  Psychiatric:        Mood and Affect: Mood normal.     Starleen Arms, MD Department of Emergency Medicine   Please note that this documentation was produced with the assistance of voice-to-text technology and may contain errors.    Dyanne Iha, MD 02/01/23 4098    Glyn Ade, MD 02/06/23 1445

## 2023-02-01 NOTE — ED Triage Notes (Signed)
Pt bib ems c.o abd pain and vomiting.

## 2023-02-05 NOTE — Progress Notes (Signed)
To evaluate for drug use.  [name]. [name], MD [facility] Emergency Medicine [facility] [email or web address]

## 2023-02-09 ENCOUNTER — Telehealth: Payer: Self-pay

## 2023-02-09 NOTE — Telephone Encounter (Signed)
Transition Care Management Unsuccessful Follow-up Telephone Call  Date of discharge and from where:  [facility]  Attempts:  2nd Attempt  Reason for unsuccessful TCM follow-up call:  No answer/busy   [facility] Guide, [facility] [phone] [address] [place], [place], [place] [address] Phone: [phone] Email: [email or web address]

## 2023-02-09 NOTE — Telephone Encounter (Signed)
Transition Care Management Unsuccessful Follow-up Telephone Call  Date of discharge and from where:  [name] 8/11  Attempts:  1st Attempt  Reason for unsuccessful TCM follow-up call:  No answer/busy   [facility] Guide, [facility] [phone] [address] [place], [place], [place] [address] Phone: [phone] Email: [name]

## 2023-02-10 ENCOUNTER — Emergency Department (HOSPITAL_COMMUNITY)
Admission: EM | Admit: 2023-02-10 | Discharge: 2023-02-10 | Disposition: A | Discharge status: Home / Self Care | Payer: 59 | Attending: Emergency Medicine | Admitting: Emergency Medicine

## 2023-02-10 ENCOUNTER — Encounter (HOSPITAL_COMMUNITY): Payer: Self-pay

## 2023-02-10 ENCOUNTER — Emergency Department (HOSPITAL_COMMUNITY): Payer: 59

## 2023-02-10 ENCOUNTER — Other Ambulatory Visit: Payer: Self-pay

## 2023-02-10 DIAGNOSIS — Y900 Blood alcohol level of less than 20 mg/100 ml: Secondary | ICD-10-CM | POA: Insufficient documentation

## 2023-02-10 DIAGNOSIS — E119 Type 2 diabetes mellitus without complications: Secondary | ICD-10-CM | POA: Diagnosis not present

## 2023-02-10 DIAGNOSIS — R062 Wheezing: Secondary | ICD-10-CM | POA: Diagnosis not present

## 2023-02-10 DIAGNOSIS — F10920 Alcohol use, unspecified with intoxication, uncomplicated: Secondary | ICD-10-CM | POA: Diagnosis not present

## 2023-02-10 DIAGNOSIS — J441 Chronic obstructive pulmonary disease with (acute) exacerbation: Secondary | ICD-10-CM | POA: Diagnosis not present

## 2023-02-10 DIAGNOSIS — R6889 Other general symptoms and signs: Secondary | ICD-10-CM | POA: Diagnosis not present

## 2023-02-10 DIAGNOSIS — Z7984 Long term (current) use of oral hypoglycemic drugs: Secondary | ICD-10-CM | POA: Insufficient documentation

## 2023-02-10 DIAGNOSIS — Z743 Need for continuous supervision: Secondary | ICD-10-CM | POA: Diagnosis not present

## 2023-02-10 DIAGNOSIS — R0602 Shortness of breath: Secondary | ICD-10-CM | POA: Diagnosis not present

## 2023-02-10 DIAGNOSIS — Z7951 Long term (current) use of inhaled steroids: Secondary | ICD-10-CM | POA: Diagnosis not present

## 2023-02-10 LAB — CBC WITH DIFFERENTIAL/PLATELET
Abs Immature Granulocytes: 0.03 10*3/uL (ref 0.00–0.07)
Basophils Absolute: 0 10*3/uL (ref 0.0–0.1)
Basophils Relative: 0 %
Eosinophils Absolute: 0 10*3/uL (ref 0.0–0.5)
Eosinophils Relative: 0 %
HCT: 29.5 % — ABNORMAL LOW (ref 36.0–46.0)
Hemoglobin: 9 g/dL — ABNORMAL LOW (ref 12.0–15.0)
Immature Granulocytes: 0 %
Lymphocytes Relative: 2 %
Lymphs Abs: 0.2 10*3/uL — ABNORMAL LOW (ref 0.7–4.0)
MCH: 23.9 pg — ABNORMAL LOW (ref 26.0–34.0)
MCHC: 30.5 g/dL (ref 30.0–36.0)
MCV: 78.5 fL — ABNORMAL LOW (ref 80.0–100.0)
Monocytes Absolute: 0 10*3/uL — ABNORMAL LOW (ref 0.1–1.0)
Monocytes Relative: 1 %
Neutro Abs: 7.6 10*3/uL (ref 1.7–7.7)
Neutrophils Relative %: 97 %
Platelets: 243 10*3/uL (ref 150–400)
RBC: 3.76 MIL/uL — ABNORMAL LOW (ref 3.87–5.11)
RDW: 21.9 % — ABNORMAL HIGH (ref 11.5–15.5)
WBC: 7.9 10*3/uL (ref 4.0–10.5)
nRBC: 0 % (ref 0.0–0.2)

## 2023-02-10 LAB — BASIC METABOLIC PANEL
Anion gap: 17 — ABNORMAL HIGH (ref 5–15)
BUN: 6 mg/dL (ref 6–20)
CO2: 24 mmol/L (ref 22–32)
Calcium: 8.5 mg/dL — ABNORMAL LOW (ref 8.9–10.3)
Chloride: 99 mmol/L (ref 98–111)
Creatinine, Ser: 0.44 mg/dL (ref 0.44–1.00)
GFR, Estimated: >60 mL/min (ref >60)
Glucose, Bld: 221 mg/dL — ABNORMAL HIGH (ref 70–99)
Potassium: 4.6 mmol/L (ref 3.5–5.1)
Sodium: 140 mmol/L (ref 135–145)

## 2023-02-10 LAB — BRAIN NATRIURETIC PEPTIDE: B Natriuretic Peptide: 79.5 pg/mL (ref 0.0–100.0)

## 2023-02-10 LAB — CBG MONITORING, ED: Glucose-Capillary: 206 mg/dL — ABNORMAL HIGH (ref 70–99)

## 2023-02-10 LAB — TROPONIN I (HIGH SENSITIVITY)
Troponin I (High Sensitivity): 7 ng/L (ref <18)
Troponin I (High Sensitivity): 6 ng/L (ref <18)

## 2023-02-10 LAB — ETHANOL: Alcohol, Ethyl (B): 10 mg/dL — ABNORMAL HIGH (ref <10)

## 2023-02-10 MED ORDER — ALUM & MAG HYDROXIDE-SIMETH 200-200-20 MG/5ML PO SUSP
30.0000 mL | Freq: Once | ORAL | Status: AC
  Administered 2023-02-10: 30 mL via ORAL
  Filled 2023-02-10: qty 30

## 2023-02-10 MED ORDER — PREDNISONE 50 MG PO TABS: ORAL_TABLET | ORAL | 0 refills | Status: DC

## 2023-02-10 MED ORDER — ALBUTEROL SULFATE HFA 108 (90 BASE) MCG/ACT IN AERS: 1.0000 | INHALATION_SPRAY | Freq: Four times a day (QID) | RESPIRATORY_TRACT | 0 refills | Status: DC | PRN

## 2023-02-10 MED ORDER — MAGNESIUM SULFATE 2 GM/50ML IV SOLN
2.0000 g | Freq: Once | INTRAVENOUS | Status: AC
  Administered 2023-02-10: 2 g via INTRAVENOUS
  Filled 2023-02-10 (×2): qty 50

## 2023-02-10 MED ORDER — ACETAMINOPHEN 325 MG PO TABS
650.0000 mg | ORAL_TABLET | Freq: Once | ORAL | Status: AC
  Administered 2023-02-10: 650 mg via ORAL
  Filled 2023-02-10: qty 2

## 2023-02-10 MED ORDER — IPRATROPIUM-ALBUTEROL 0.5-2.5 (3) MG/3ML IN SOLN
3.0000 mL | Freq: Once | RESPIRATORY_TRACT | Status: AC
  Administered 2023-02-10: 3 mL via RESPIRATORY_TRACT
  Filled 2023-02-10: qty 3

## 2023-02-10 NOTE — ED Notes (Signed)
SOB labs drawn and sent. Will call when orders have been placed.

## 2023-02-10 NOTE — ED Notes (Signed)
Pt on neb. X-ray arrived and will come back shortly.

## 2023-02-10 NOTE — ED Provider Notes (Signed)
[place] EMERGENCY DEPARTMENT AT [facility] Provider Note   CSN: [id number] Arrival date & time: 02/10/23  0138     History  Chief Complaint  Patient presents with   Shortness of Breath    [name] is a 57 y.o. female.  Patient well-known to the ED presents intoxicated with shortness of breath.  Does have history of COPD, atrial fibrillation, diabetes, schizophrenia, frequent visits for abdominal pain and alcohol intoxication.  Presents with difficulty breathing since earlier today.  States unrelieved by her home inhaler.  Does use oxygen at home but does not know how much.  She is given DuoNebs and Solu-Medrol by EMS.  Has some chest pain with coughing.  Nonproductive cough with congestion.  Abdominal pain is at baseline.  No vomiting or diarrhea.  No leg pain or leg swelling.  Feeling better after receiving nebulizers from EMS.  Denies chest pain except when she is coughing.  The history is provided by the patient.  Shortness of Breath Associated symptoms: cough   Associated symptoms: no abdominal pain, no chest pain, no fever, no rash and no vomiting        Home Medications Prior to Admission medications   Medication Sig Start Date End Date Taking? Authorizing Provider  albuterol (VENTOLIN HFA) 108 (90 Base) MCG/ACT inhaler Inhale 1-2 puffs into the lungs every 4 (four) hours as needed for wheezing or shortness of breath. 12/24/22   [provider]  budesonide-formoterol (SYMBICORT) 160-4.5 MCG/ACT inhaler Inhale 2 puffs into the lungs 2 (two) times daily. Patient not taking: Reported on 01/11/2023 12/01/22   Maretta Bees, MD  famotidine (PEPCID) 20 MG tablet Take 1 tablet (20 mg total) by mouth daily. 01/24/23   Elayne Snare K, DO  famotidine (PEPCID) 20 MG tablet Take 1 tablet (20 mg total) by mouth 2 (two) times daily. 02/01/23   Glyn Ade, MD  hyoscyamine (LEVSIN/SL) 0.125 MG SL tablet Place 1 tablet (0.125 mg total) under the tongue  every 4 (four) hours as needed (pain). Up to 1.25 mg daily 01/23/23   Arthor Captain, PA-C  metFORMIN (GLUCOPHAGE-XR) 750 MG 24 hr tablet Take 750 mg by mouth daily. Patient not taking: Reported on 01/11/2023 12/30/22   [provider]  ondansetron (ZOFRAN) 4 MG tablet Take 1 tablet (4 mg total) by mouth every 6 (six) hours. 01/20/23   Prosperi, Christian H, PA-C  ondansetron (ZOFRAN-ODT) 4 MG disintegrating tablet Take 1 tablet (4 mg total) by mouth every 8 (eight) hours as needed for nausea or vomiting. 01/23/23   Arthor Captain, PA-C  OXYGEN Inhale 4-5 L into the lungs as needed (COPD).    [provider]  zonisamide (ZONEGRAN) 100 MG capsule Take 2 capsules (200 mg total) by mouth daily. 01/12/23   Lewie Chamber, MD      Allergies    Iron dextran, Aspirin, and Penicillins    Review of Systems   Review of Systems  Constitutional:  Negative for activity change, appetite change and fever.  HENT:  Negative for congestion and rhinorrhea.   Respiratory:  Positive for cough, chest tightness and shortness of breath.   Cardiovascular:  Negative for chest pain and leg swelling.  Gastrointestinal:  Negative for abdominal pain, nausea and vomiting.  Genitourinary:  Negative for dysuria and hematuria.  Musculoskeletal:  Negative for arthralgias and myalgias.  Skin:  Negative for rash.  Neurological:  Negative for weakness.    all other systems are negative except as noted in the  HPI and PMH.   Physical Exam Updated Vital Signs BP 134/74 (BP Location: Left Arm)   Pulse (!) 113   Temp 98.8 F (37.1 C) (Oral)   Resp 20   Ht 5\' 2"  (1.575 m)   Wt 49.9 kg   LMP 01/08/2011   SpO2 94%   BMI 20.12 kg/m  Physical Exam Vitals and nursing note reviewed.  Constitutional:      General: She is not in acute distress.    Appearance: She is well-developed.     Comments: Appears intoxicated  HENT:     Head: Normocephalic and atraumatic.     Mouth/Throat:     Pharynx: No oropharyngeal  exudate.  Eyes:     Conjunctiva/sclera: Conjunctivae normal.     Pupils: Pupils are equal, round, and reactive to light.  Neck:     Comments: No meningismus. Cardiovascular:     Rate and Rhythm: Regular rhythm. Tachycardia present.     Heart sounds: Normal heart sounds. No murmur heard. Pulmonary:     Effort: Pulmonary effort is normal. No respiratory distress.     Breath sounds: Wheezing present.     Comments: Expiratory wheezing bilateral Abdominal:     Palpations: Abdomen is soft.     Tenderness: There is no abdominal tenderness. There is no guarding or rebound.  Musculoskeletal:        General: No tenderness. Normal range of motion.     Cervical back: Normal range of motion and neck supple.  Skin:    General: Skin is warm.  Neurological:     Mental Status: She is alert and oriented to person, place, and time.     Cranial Nerves: No cranial nerve deficit.     Motor: No abnormal muscle tone.     Coordination: Coordination normal.     Comments:  5/5 strength throughout. CN 2-12 intact.Equal grip strength.   Psychiatric:        Behavior: Behavior normal.     ED Results / Procedures / Treatments   Labs (all labs ordered are listed, but only abnormal results are displayed) Labs Reviewed  BASIC METABOLIC PANEL - Abnormal; Notable for the following components:      Result Value   Glucose, Bld 221 (*)    Calcium 8.5 (*)    Anion gap 17 (*)    All other components within normal limits  ETHANOL - Abnormal; Notable for the following components:   Alcohol, Ethyl (B) 10 (*)    All other components within normal limits  CBC WITH DIFFERENTIAL/PLATELET - Abnormal; Notable for the following components:   RBC 3.76 (*)    Hemoglobin 9.0 (*)    HCT 29.5 (*)    MCV 78.5 (*)    MCH 23.9 (*)    RDW 21.9 (*)    Lymphs Abs 0.2 (*)    Monocytes Absolute 0.0 (*)    All other components within normal limits  CBG MONITORING, ED - Abnormal; Notable for the following components:    Glucose-Capillary 206 (*)    All other components within normal limits  BRAIN NATRIURETIC PEPTIDE  CBC WITH DIFFERENTIAL/PLATELET  TROPONIN I (HIGH SENSITIVITY)  TROPONIN I (HIGH SENSITIVITY)    EKG None  Radiology DG Chest 2 View  Result Date: 02/10/2023 CLINICAL DATA:  Shortness of breath EXAM: CHEST - 2 VIEW COMPARISON:  Radiograph 01/25/2023 FINDINGS: Stable cardiomediastinal silhouette. No focal consolidation, pleural effusion, or pneumothorax. No displaced rib fractures. Chronic compression fracture of T6 and T9. IMPRESSION: No  active cardiopulmonary disease. Electronically Signed   By: Minerva Fester M.D.   On: 02/10/2023 03:35    Procedures Procedures    Medications Ordered in ED Medications  ipratropium-albuterol (DUONEB) 0.5-2.5 (3) MG/3ML nebulizer solution 3 mL (has no administration in time range)  magnesium sulfate IVPB 2 g 50 mL (has no administration in time range)    ED Course/ Medical Decision Making/ A&P                                 Medical Decision Making Amount and/or Complexity of Data Reviewed Independent Historian: EMS Labs: ordered. Decision-making details documented in ED Course. Radiology: ordered and independent interpretation performed. Decision-making details documented in ED Course. ECG/medicine tests: ordered and independent interpretation performed. Decision-making details documented in ED Course.  Risk OTC drugs. Prescription drug management.   Patient well-known to the ED.  Presents with difficulty breathing since earlier today.  Diminished breath sounds on exam with tachycardia.  No hypoxia.  Given bronchodilators and steroids.  Obtain chest x-ray EKG with sinus tachycardia. No acute ST changes.   Labs show stable anemia.  Troponin negative. Hyperglycemia without evidence of DKA.  Chest x-ray negative for infiltrate or pneumothorax.  Results reviewed and interpreted by me EKG without acute ischemia.  On recheck, patient's  work of breathing and wheezing have improved.  She is able to ambulate without desaturation.  She is requesting discharge.  She reports her abdominal pain is at baseline.  Low suspicion for ACS, PE, aortic dissection.  Will give course of steroids and albuterol for home use.  Return precautions discussed       Final Clinical Impression(s) / ED Diagnoses Final diagnoses:  COPD exacerbation [facility])    Rx / DC Orders ED Discharge Orders     None         Marciana Uplinger, Jeannett Senior, MD 02/10/23 [phone]

## 2023-02-10 NOTE — ED Notes (Signed)
Pt ambulatory pulse ox never dropped below 96%

## 2023-02-10 NOTE — Discharge Instructions (Addendum)
Take the steroids as prescribed and use your albuterol every 4 hours as needed for difficulty breathing.  Follow-up with your doctor.  Return to ED with chest pain, shortness of breath, other concerns

## 2023-02-10 NOTE — ED Triage Notes (Signed)
PER EMS: pt from home with c/o progressively worsening shortness of breath throughout the day today, unrelieved with her inhaler. EMS adm duoneb and 125mg  of solumedrol IV en route. Hx of COPD, 2L of home O2 but she is not compliant. Pt told EMS she drank "two 40s tonight."  22g left hand.  BP-140/84, HR-104, 92% RA, 100% on nebulizer txt. [id number]

## 2023-02-11 ENCOUNTER — Encounter (HOSPITAL_COMMUNITY): Payer: Self-pay | Admitting: Emergency Medicine

## 2023-02-11 ENCOUNTER — Emergency Department (HOSPITAL_COMMUNITY)
Admission: EM | Admit: 2023-02-11 | Discharge: 2023-02-11 | Disposition: A | Discharge status: Home / Self Care | Payer: 59 | Attending: Emergency Medicine | Admitting: Emergency Medicine

## 2023-02-11 ENCOUNTER — Other Ambulatory Visit: Payer: Self-pay

## 2023-02-11 ENCOUNTER — Emergency Department (HOSPITAL_COMMUNITY): Payer: 59

## 2023-02-11 DIAGNOSIS — K2920 Alcoholic gastritis without bleeding: Secondary | ICD-10-CM | POA: Insufficient documentation

## 2023-02-11 DIAGNOSIS — Z7984 Long term (current) use of oral hypoglycemic drugs: Secondary | ICD-10-CM | POA: Insufficient documentation

## 2023-02-11 DIAGNOSIS — M40204 Unspecified kyphosis, thoracic region: Secondary | ICD-10-CM | POA: Diagnosis not present

## 2023-02-11 DIAGNOSIS — J449 Chronic obstructive pulmonary disease, unspecified: Secondary | ICD-10-CM | POA: Diagnosis not present

## 2023-02-11 DIAGNOSIS — R1013 Epigastric pain: Secondary | ICD-10-CM | POA: Diagnosis present

## 2023-02-11 DIAGNOSIS — R062 Wheezing: Secondary | ICD-10-CM | POA: Diagnosis not present

## 2023-02-11 DIAGNOSIS — E119 Type 2 diabetes mellitus without complications: Secondary | ICD-10-CM | POA: Diagnosis not present

## 2023-02-11 DIAGNOSIS — R109 Unspecified abdominal pain: Secondary | ICD-10-CM | POA: Diagnosis not present

## 2023-02-11 DIAGNOSIS — Z743 Need for continuous supervision: Secondary | ICD-10-CM | POA: Diagnosis not present

## 2023-02-11 DIAGNOSIS — I10 Essential (primary) hypertension: Secondary | ICD-10-CM | POA: Diagnosis not present

## 2023-02-11 DIAGNOSIS — E039 Hypothyroidism, unspecified: Secondary | ICD-10-CM | POA: Insufficient documentation

## 2023-02-11 DIAGNOSIS — I499 Cardiac arrhythmia, unspecified: Secondary | ICD-10-CM | POA: Diagnosis not present

## 2023-02-11 DIAGNOSIS — R0602 Shortness of breath: Secondary | ICD-10-CM | POA: Diagnosis not present

## 2023-02-11 LAB — LIPASE, BLOOD: Lipase: 41 U/L (ref 11–51)

## 2023-02-11 LAB — CBC
HCT: 32.1 % — ABNORMAL LOW (ref 36.0–46.0)
Hemoglobin: 9.9 g/dL — ABNORMAL LOW (ref 12.0–15.0)
MCH: 24.6 pg — ABNORMAL LOW (ref 26.0–34.0)
MCHC: 30.8 g/dL (ref 30.0–36.0)
MCV: 79.9 fL — ABNORMAL LOW (ref 80.0–100.0)
Platelets: 282 10*3/uL (ref 150–400)
RBC: 4.02 MIL/uL (ref 3.87–5.11)
RDW: 21.6 % — ABNORMAL HIGH (ref 11.5–15.5)
WBC: 7.6 10*3/uL (ref 4.0–10.5)
nRBC: 0 % (ref 0.0–0.2)

## 2023-02-11 LAB — ETHANOL: Alcohol, Ethyl (B): <10 mg/dL (ref <10)

## 2023-02-11 LAB — COMPREHENSIVE METABOLIC PANEL
ALT: 23 U/L (ref 0–44)
AST: 24 U/L (ref 15–41)
Albumin: 3.1 g/dL — ABNORMAL LOW (ref 3.5–5.0)
Alkaline Phosphatase: 95 U/L (ref 38–126)
Anion gap: 16 — ABNORMAL HIGH (ref 5–15)
BUN: 7 mg/dL (ref 6–20)
CO2: 23 mmol/L (ref 22–32)
Calcium: 8.6 mg/dL — ABNORMAL LOW (ref 8.9–10.3)
Chloride: 100 mmol/L (ref 98–111)
Creatinine, Ser: 0.62 mg/dL (ref 0.44–1.00)
GFR, Estimated: >60 mL/min (ref >60)
Glucose, Bld: 182 mg/dL — ABNORMAL HIGH (ref 70–99)
Potassium: 4.6 mmol/L (ref 3.5–5.1)
Sodium: 139 mmol/L (ref 135–145)
Total Bilirubin: 0.2 mg/dL — ABNORMAL LOW (ref 0.3–1.2)
Total Protein: 6.9 g/dL (ref 6.5–8.1)

## 2023-02-11 MED ORDER — SUCRALFATE 1 G PO TABS
1.0000 g | ORAL_TABLET | Freq: Once | ORAL | Status: AC
  Administered 2023-02-11: 1 g via ORAL
  Filled 2023-02-11: qty 1

## 2023-02-11 MED ORDER — ALUM & MAG HYDROXIDE-SIMETH 200-200-20 MG/5ML PO SUSP
30.0000 mL | Freq: Once | ORAL | Status: AC
  Administered 2023-02-11: 30 mL via ORAL
  Filled 2023-02-11: qty 30

## 2023-02-11 MED ORDER — SUCRALFATE 1 G PO TABS: 1.0000 g | ORAL_TABLET | Freq: Three times a day (TID) | ORAL | 0 refills | Status: DC

## 2023-02-11 MED ORDER — MORPHINE SULFATE (PF) 4 MG/ML IV SOLN
4.0000 mg | Freq: Once | INTRAVENOUS | Status: AC
  Administered 2023-02-11: 4 mg via INTRAVENOUS
  Filled 2023-02-11: qty 1

## 2023-02-11 MED ORDER — ALBUTEROL SULFATE HFA 108 (90 BASE) MCG/ACT IN AERS
2.0000 | INHALATION_SPRAY | Freq: Once | RESPIRATORY_TRACT | Status: AC
  Administered 2023-02-11: 2 via RESPIRATORY_TRACT
  Filled 2023-02-11: qty 6.7

## 2023-02-11 MED ORDER — ONDANSETRON HCL 4 MG/2ML IJ SOLN
4.0000 mg | Freq: Once | INTRAMUSCULAR | Status: AC
  Administered 2023-02-11: 4 mg via INTRAVENOUS
  Filled 2023-02-11: qty 2

## 2023-02-11 MED ORDER — PANTOPRAZOLE SODIUM 40 MG IV SOLR
40.0000 mg | Freq: Once | INTRAVENOUS | Status: AC
  Administered 2023-02-11: 40 mg via INTRAVENOUS
  Filled 2023-02-11: qty 10

## 2023-02-11 MED ORDER — ALBUTEROL SULFATE HFA 108 (90 BASE) MCG/ACT IN AERS: 2.0000 | INHALATION_SPRAY | RESPIRATORY_TRACT | Status: DC | PRN

## 2023-02-11 NOTE — Discharge Instructions (Signed)
You were seen today for ongoing abdominal pain.  This is likely related to your ongoing alcohol abuse.  Take Carafate and continue your Pepcid.  Regarding your shortness of breath, continue your medications for COPD as prescribed.

## 2023-02-11 NOTE — ED Triage Notes (Signed)
Patient arrives by EMS tonight with c/o SHOB. When EMS arrived she had expiratory wheezing and 94% on RA. Was given a duoneb and wheezing stopped, Spo2 100% however patient slightly tachycardic. Reports her shortness of breath has gotten progressively worse since waking up however couldn't specify a time. Also reports upper abdominal pain that she states also started tonight. N/v/d per the patient.   118/72  HR 100 Spo2-100% on RA

## 2023-02-11 NOTE — ED Provider Notes (Signed)
[place] EMERGENCY DEPARTMENT AT [facility] Provider Note   CSN: [id number] Arrival date & time: 02/11/23  0208     History  Chief Complaint  Patient presents with   Shortness of Breath    CLORETTA MOLINAR is a 57 y.o. female.  HPI     This is a 57 year old female with history of COPD who presents with abdominal pain and back pain.  Patient also reports shortness of breath.  Was picked up by EMS and found to be wheezing and tachycardic.  Was seen and evaluated 24 hours ago for COPD exacerbation.  Patient is mostly complaining of epigastric and back pain.  Onset was acute in nature.  Reports nausea without vomiting.  No change in bowel movements.  Has not had any fevers or cough.  Home Medications Prior to Admission medications   Medication Sig Start Date End Date Taking? Authorizing Provider  sucralfate (CARAFATE) 1 g tablet Take 1 tablet (1 g total) by mouth 4 (four) times daily -  with meals and at bedtime. 02/11/23  Yes Waldron Gerry, Mayer Masker, MD  albuterol (VENTOLIN HFA) 108 (90 Base) MCG/ACT inhaler Inhale 1-2 puffs into the lungs every 4 (four) hours as needed for wheezing or shortness of breath. 12/24/22   [provider]  albuterol (VENTOLIN HFA) 108 (90 Base) MCG/ACT inhaler Inhale 1-2 puffs into the lungs every 6 (six) hours as needed for wheezing or shortness of breath. 02/10/23   Rancour, Jeannett Senior, MD  budesonide-formoterol (SYMBICORT) 160-4.5 MCG/ACT inhaler Inhale 2 puffs into the lungs 2 (two) times daily. Patient not taking: Reported on 01/11/2023 12/01/22   Maretta Bees, MD  famotidine (PEPCID) 20 MG tablet Take 1 tablet (20 mg total) by mouth daily. 01/24/23   Elayne Snare K, DO  famotidine (PEPCID) 20 MG tablet Take 1 tablet (20 mg total) by mouth 2 (two) times daily. 02/01/23   Glyn Ade, MD  hyoscyamine (LEVSIN/SL) 0.125 MG SL tablet Place 1 tablet (0.125 mg total) under the tongue every 4 (four) hours as needed (pain). Up to 1.25 mg  daily 01/23/23   Arthor Captain, PA-C  metFORMIN (GLUCOPHAGE-XR) 750 MG 24 hr tablet Take 1 tablet (750 mg total) by mouth daily. 02/12/23 03/14/23  Jeannie Fend, PA-C  ondansetron (ZOFRAN) 4 MG tablet Take 1 tablet (4 mg total) by mouth every 6 (six) hours. 01/20/23   Prosperi, Christian H, PA-C  ondansetron (ZOFRAN-ODT) 4 MG disintegrating tablet Take 1 tablet (4 mg total) by mouth every 8 (eight) hours as needed for nausea or vomiting. 01/23/23   Arthor Captain, PA-C  OXYGEN Inhale 4-5 L into the lungs as needed (COPD).    [provider]  predniSONE (DELTASONE) 50 MG tablet 1 tablet PO daily 02/10/23   Rancour, Jeannett Senior, MD  zonisamide (ZONEGRAN) 100 MG capsule Take 2 capsules (200 mg total) by mouth daily. 01/12/23   Lewie Chamber, MD      Allergies    Iron dextran, Aspirin, and Penicillins    Review of Systems   Review of Systems  Constitutional:  Negative for fever.  Respiratory:  Positive for shortness of breath.   Gastrointestinal:  Positive for abdominal pain and nausea. Negative for diarrhea and vomiting.  All other systems reviewed and are negative.   Physical Exam Updated Vital Signs BP 138/75   Pulse (!) 113   Temp 98.3 F (36.8 C) (Oral)   Resp (!) 22   Ht 1.575 m (5\' 2" )   Wt 49 kg  LMP 01/08/2011   SpO2 100%   BMI 19.76 kg/m  Physical Exam Vitals and nursing note reviewed.  Constitutional:      Comments: Chronically ill-appearing, no acute distress  HENT:     Head: Normocephalic and atraumatic.  Eyes:     Pupils: Pupils are equal, round, and reactive to light.  Cardiovascular:     Rate and Rhythm: Regular rhythm. Tachycardia present.     Heart sounds: Normal heart sounds.  Pulmonary:     Effort: Pulmonary effort is normal. No respiratory distress.     Breath sounds: Wheezing present.     Comments: Nasal cannula in place Abdominal:     General: Bowel sounds are normal.     Palpations: Abdomen is soft.     Comments: Epigastric tenderness to  palpation, no rebound or guarding  Musculoskeletal:     Cervical back: Neck supple.  Skin:    General: Skin is warm and dry.  Neurological:     Mental Status: She is alert and oriented to person, place, and time.  Psychiatric:        Mood and Affect: Mood normal.     ED Results / Procedures / Treatments   Labs (all labs ordered are listed, but only abnormal results are displayed) Labs Reviewed  COMPREHENSIVE METABOLIC PANEL - Abnormal; Notable for the following components:      Result Value   Glucose, Bld 182 (*)    Calcium 8.6 (*)    Albumin 3.1 (*)    Total Bilirubin 0.2 (*)    Anion gap 16 (*)    All other components within normal limits  CBC - Abnormal; Notable for the following components:   Hemoglobin 9.9 (*)    HCT 32.1 (*)    MCV 79.9 (*)    MCH 24.6 (*)    RDW 21.6 (*)    All other components within normal limits  LIPASE, BLOOD  ETHANOL    EKG None  Radiology DG Chest 2 View  Result Date: 02/11/2023 CLINICAL DATA:  Shortness of breath and wheezing. EXAM: CHEST - 2 VIEW COMPARISON:  PA Lat yesterday at 3:05 a.m. FINDINGS: The lungs are clear. Heart size and vasculature are normal. The mediastinum is normally outlined. There is mild calcific plaque of the transverse aorta. There is osteopenia with thoracic kyphosis and chronic compression fractures of the T6 and T9 vertebral bodies. IMPRESSION: 1. No active cardiopulmonary disease. 2. Chronic compression fractures of the T6 and T9 vertebral bodies. Osteopenia. 3. Stable chest. Electronically Signed   By: Almira Bar M.D.   On: 02/11/2023 03:05    Procedures Procedures    Medications Ordered in ED Medications  morphine (PF) 4 MG/ML injection 4 mg (4 mg Intravenous Given 02/11/23 0616)  ondansetron (ZOFRAN) injection 4 mg (4 mg Intravenous Given 02/11/23 0616)  albuterol (VENTOLIN HFA) 108 (90 Base) MCG/ACT inhaler 2 puff (2 puffs Inhalation Given 02/11/23 0536)  pantoprazole (PROTONIX) injection 40 mg (40 mg  Intravenous Given 02/11/23 0616)  alum & mag hydroxide-simeth (MAALOX/MYLANTA) 200-200-20 MG/5ML suspension 30 mL (30 mLs Oral Given 02/11/23 0626)  sucralfate (CARAFATE) tablet 1 g (1 g Oral Given 02/11/23 1610)    ED Course/ Medical Decision Making/ A&P                                 Medical Decision Making Amount and/or Complexity of Data Reviewed Labs: ordered. Radiology: ordered.  Risk OTC  drugs. Prescription drug management.   This patient presents to the ED for concern of this of breath, abdominal pain, this involves an extensive number of treatment options, and is a complaint that carries with it a high risk of complications and morbidity.  I considered the following differential and admission for this acute, potentially life threatening condition.  The differential diagnosis includes COPD, pneumonia, pneumothorax, gastritis, gastroenteritis, pancreatitis  MDM:    This is a 57 year old female well-known to our emergency department who presents with reported shortness of breath.  Did have some improvement after DuoNeb by EMS.  Is also complaining of abdominal pain.  Significant history of alcohol use and abuse with daily consumption.  She has chronic alcoholic gastritis.  She is on her baseline oxygen requirement.  Labs are largely unremarkable for her.  Chest x-ray is unchanged without pneumothorax or pneumonia.  EKG is nonischemic.  Patient appears mostly at her baseline.  She was worked up thoroughly and diagnosed with a COPD exacerbation approximately 24 to 48 hours ago.  She has inhalers at home and steroids.  Advised her that she should quit smoking and stop drinking.  (Labs, imaging, consults)  Labs: I Ordered, and personally interpreted labs.  The pertinent results include: CBC, CMP, lipase  Imaging Studies ordered: I ordered imaging studies including chest x-ray I independently visualized and interpreted imaging. I agree with the radiologist interpretation  Additional  history obtained from chart review.  External records from outside source obtained and reviewed including prior evaluations  Cardiac Monitoring: The patient was maintained on a cardiac monitor.  If on the cardiac monitor, I personally viewed and interpreted the cardiac monitored which showed an underlying rhythm of: Sinus tachycardia  Reevaluation: After the interventions noted above, I reevaluated the patient and found that they have :stayed the same  Social Determinants of Health:  alcohol abuse  Disposition: Discharge  Co morbidities that complicate the patient evaluation  Past Medical History:  Diagnosis Date   Abdominal pain    Accidental drug overdose April 2013   Anxiety    Atrial fibrillation (HCC) 09/29/11   converted spontaneously   Chronic back pain    Chronic knee pain    Chronic nausea    Chronic pain    COPD (chronic obstructive pulmonary disease) (HCC)    Depression    Diabetes mellitus    states her doctor took her off all DM meds in past month   Diabetic neuropathy (HCC)    Dyspnea    with exertion    GERD (gastroesophageal reflux disease)    Headache(784.0)    migraines    HTN (hypertension)    not on meds since in a year    Hyperlipidemia    Hypothyroidism    not on meds in a while    Mental disorder    Bipolar and schizophrenic   Nausea and vomiting 01/02/2023   Requires supplemental oxygen    as needed per patient    Schizophrenia (HCC)    Schizophrenia, acute (HCC) 11/13/2017   Tobacco abuse      Medicines Meds ordered this encounter  Medications   DISCONTD: albuterol (VENTOLIN HFA) 108 (90 Base) MCG/ACT inhaler 2 puff   morphine (PF) 4 MG/ML injection 4 mg   ondansetron (ZOFRAN) injection 4 mg   albuterol (VENTOLIN HFA) 108 (90 Base) MCG/ACT inhaler 2 puff   pantoprazole (PROTONIX) injection 40 mg   alum & mag hydroxide-simeth (MAALOX/MYLANTA) 200-200-20 MG/5ML suspension 30 mL   sucralfate (CARAFATE) tablet  1 g   sucralfate (CARAFATE) 1  g tablet    Sig: Take 1 tablet (1 g total) by mouth 4 (four) times daily -  with meals and at bedtime.    Dispense:  90 tablet    Refill:  0    I have reviewed the patients home medicines and have made adjustments as needed  Problem List / ED Course: Problem List Items Addressed This Visit   None Visit Diagnoses     Chronic alcoholic gastritis without hemorrhage    -  Primary                   Final Clinical Impression(s) / ED Diagnoses Final diagnoses:  Chronic alcoholic gastritis without hemorrhage    Rx / DC Orders ED Discharge Orders          Ordered    sucralfate (CARAFATE) 1 g tablet  3 times daily with meals & bedtime        02/11/23 0701              Shon Baton, MD 02/13/23 [phone]

## 2023-02-11 NOTE — ED Notes (Signed)
Pt walked outside to smoke a cigarette. Pt advised the smoking while having SOB and on oxygen is not advised. Pt then yelled "I can breathe enough to smoke." Pt then seen leaving ED to smoke.

## 2023-02-12 ENCOUNTER — Emergency Department (HOSPITAL_COMMUNITY)
Admission: EM | Admit: 2023-02-12 | Discharge: 2023-02-12 | Disposition: A | Discharge status: Home / Self Care | Payer: 59 | Attending: Emergency Medicine | Admitting: Emergency Medicine

## 2023-02-12 ENCOUNTER — Other Ambulatory Visit: Payer: Self-pay

## 2023-02-12 ENCOUNTER — Encounter (HOSPITAL_COMMUNITY): Payer: Self-pay

## 2023-02-12 DIAGNOSIS — R0902 Hypoxemia: Secondary | ICD-10-CM | POA: Diagnosis not present

## 2023-02-12 DIAGNOSIS — Z743 Need for continuous supervision: Secondary | ICD-10-CM | POA: Diagnosis not present

## 2023-02-12 DIAGNOSIS — Z7951 Long term (current) use of inhaled steroids: Secondary | ICD-10-CM | POA: Insufficient documentation

## 2023-02-12 DIAGNOSIS — Z7952 Long term (current) use of systemic steroids: Secondary | ICD-10-CM | POA: Diagnosis not present

## 2023-02-12 DIAGNOSIS — M549 Dorsalgia, unspecified: Secondary | ICD-10-CM | POA: Diagnosis not present

## 2023-02-12 DIAGNOSIS — R1084 Generalized abdominal pain: Secondary | ICD-10-CM | POA: Insufficient documentation

## 2023-02-12 DIAGNOSIS — R739 Hyperglycemia, unspecified: Secondary | ICD-10-CM

## 2023-02-12 DIAGNOSIS — Z79899 Other long term (current) drug therapy: Secondary | ICD-10-CM | POA: Diagnosis not present

## 2023-02-12 DIAGNOSIS — R062 Wheezing: Secondary | ICD-10-CM | POA: Diagnosis not present

## 2023-02-12 DIAGNOSIS — R000 Tachycardia, unspecified: Secondary | ICD-10-CM | POA: Insufficient documentation

## 2023-02-12 DIAGNOSIS — I10 Essential (primary) hypertension: Secondary | ICD-10-CM | POA: Diagnosis not present

## 2023-02-12 DIAGNOSIS — E1165 Type 2 diabetes mellitus with hyperglycemia: Secondary | ICD-10-CM | POA: Insufficient documentation

## 2023-02-12 DIAGNOSIS — R0602 Shortness of breath: Secondary | ICD-10-CM | POA: Diagnosis present

## 2023-02-12 DIAGNOSIS — J441 Chronic obstructive pulmonary disease with (acute) exacerbation: Secondary | ICD-10-CM | POA: Insufficient documentation

## 2023-02-12 DIAGNOSIS — Z7984 Long term (current) use of oral hypoglycemic drugs: Secondary | ICD-10-CM | POA: Diagnosis not present

## 2023-02-12 LAB — URINALYSIS, ROUTINE W REFLEX MICROSCOPIC
Bilirubin Urine: NEGATIVE
Glucose, UA: 150 mg/dL — AB
Hgb urine dipstick: NEGATIVE
Ketones, ur: NEGATIVE mg/dL
Leukocytes,Ua: NEGATIVE
Nitrite: NEGATIVE
Protein, ur: NEGATIVE mg/dL
Specific Gravity, Urine: 1.013 (ref 1.005–1.030)
pH: 5 (ref 5.0–8.0)

## 2023-02-12 LAB — COMPREHENSIVE METABOLIC PANEL
ALT: 26 U/L (ref 0–44)
AST: 40 U/L (ref 15–41)
Albumin: 3.2 g/dL — ABNORMAL LOW (ref 3.5–5.0)
Alkaline Phosphatase: 108 U/L (ref 38–126)
Anion gap: 14 (ref 5–15)
BUN: 8 mg/dL (ref 6–20)
CO2: 26 mmol/L (ref 22–32)
Calcium: 8.4 mg/dL — ABNORMAL LOW (ref 8.9–10.3)
Chloride: 94 mmol/L — ABNORMAL LOW (ref 98–111)
Creatinine, Ser: 0.53 mg/dL (ref 0.44–1.00)
GFR, Estimated: >60 mL/min (ref >60)
Glucose, Bld: 339 mg/dL — ABNORMAL HIGH (ref 70–99)
Potassium: 4.6 mmol/L (ref 3.5–5.1)
Sodium: 134 mmol/L — ABNORMAL LOW (ref 135–145)
Total Bilirubin: 0.3 mg/dL (ref 0.3–1.2)
Total Protein: 6.8 g/dL (ref 6.5–8.1)

## 2023-02-12 LAB — CBC WITH DIFFERENTIAL/PLATELET
Abs Immature Granulocytes: 0.02 10*3/uL (ref 0.00–0.07)
Basophils Absolute: 0 10*3/uL (ref 0.0–0.1)
Basophils Relative: 0 %
Eosinophils Absolute: 0 10*3/uL (ref 0.0–0.5)
Eosinophils Relative: 0 %
HCT: 32 % — ABNORMAL LOW (ref 36.0–46.0)
Hemoglobin: 9.6 g/dL — ABNORMAL LOW (ref 12.0–15.0)
Immature Granulocytes: 0 %
Lymphocytes Relative: 4 %
Lymphs Abs: 0.4 10*3/uL — ABNORMAL LOW (ref 0.7–4.0)
MCH: 24.5 pg — ABNORMAL LOW (ref 26.0–34.0)
MCHC: 30 g/dL (ref 30.0–36.0)
MCV: 81.6 fL (ref 80.0–100.0)
Monocytes Absolute: 0.1 10*3/uL (ref 0.1–1.0)
Monocytes Relative: 1 %
Neutro Abs: 7.4 10*3/uL (ref 1.7–7.7)
Neutrophils Relative %: 95 %
Platelets: 300 10*3/uL (ref 150–400)
RBC: 3.92 MIL/uL (ref 3.87–5.11)
RDW: 21.2 % — ABNORMAL HIGH (ref 11.5–15.5)
WBC: 7.9 10*3/uL (ref 4.0–10.5)
nRBC: 0 % (ref 0.0–0.2)

## 2023-02-12 LAB — LIPASE, BLOOD: Lipase: 42 U/L (ref 11–51)

## 2023-02-12 MED ORDER — METFORMIN HCL ER 750 MG PO TB24: 750.0000 mg | ORAL_TABLET | Freq: Every day | ORAL | 0 refills | Status: DC

## 2023-02-12 MED ORDER — METFORMIN HCL 500 MG PO TABS
500.0000 mg | ORAL_TABLET | Freq: Once | ORAL | Status: AC
  Administered 2023-02-12: 500 mg via ORAL
  Filled 2023-02-12: qty 1

## 2023-02-12 NOTE — ED Provider Notes (Signed)
[place] EMERGENCY DEPARTMENT AT [facility] Provider Note   CSN: [id number] Arrival date & time: 02/12/23  4098     History  Chief Complaint  Patient presents with   Shortness of Breath    [name] is a 57 y.o. female.  57 year old female brought in by EMS from home with complaint of abdominal pain and back pain.  History of COPD, reported exacerbation, provided with nebulizer treatment, arrives with shortness of breath resolved.  Patient was seen in the ER 24 hours ago with same pain abdominal pain and back pain. History significant for COPD, GERD, A-fib (not anticoagulated), chronic pain, schizophrenia, hyperlipidemia, hypertension, diabetes.       Home Medications Prior to Admission medications   Medication Sig Start Date End Date Taking? Authorizing Provider  albuterol (VENTOLIN HFA) 108 (90 Base) MCG/ACT inhaler Inhale 1-2 puffs into the lungs every 4 (four) hours as needed for wheezing or shortness of breath. 12/24/22   [provider]  albuterol (VENTOLIN HFA) 108 (90 Base) MCG/ACT inhaler Inhale 1-2 puffs into the lungs every 6 (six) hours as needed for wheezing or shortness of breath. 02/10/23   Rancour, Jeannett Senior, MD  budesonide-formoterol (SYMBICORT) 160-4.5 MCG/ACT inhaler Inhale 2 puffs into the lungs 2 (two) times daily. Patient not taking: Reported on 01/11/2023 12/01/22   Maretta Bees, MD  famotidine (PEPCID) 20 MG tablet Take 1 tablet (20 mg total) by mouth daily. 01/24/23   Elayne Snare K, DO  famotidine (PEPCID) 20 MG tablet Take 1 tablet (20 mg total) by mouth 2 (two) times daily. 02/01/23   Glyn Ade, MD  hyoscyamine (LEVSIN/SL) 0.125 MG SL tablet Place 1 tablet (0.125 mg total) under the tongue every 4 (four) hours as needed (pain). Up to 1.25 mg daily 01/23/23   Arthor Captain, PA-C  metFORMIN (GLUCOPHAGE-XR) 750 MG 24 hr tablet Take 1 tablet (750 mg total) by mouth daily. 02/12/23 03/14/23  Jeannie Fend, PA-C   ondansetron (ZOFRAN) 4 MG tablet Take 1 tablet (4 mg total) by mouth every 6 (six) hours. 01/20/23   Prosperi, Christian H, PA-C  ondansetron (ZOFRAN-ODT) 4 MG disintegrating tablet Take 1 tablet (4 mg total) by mouth every 8 (eight) hours as needed for nausea or vomiting. 01/23/23   Arthor Captain, PA-C  OXYGEN Inhale 4-5 L into the lungs as needed (COPD).    [provider]  predniSONE (DELTASONE) 50 MG tablet 1 tablet PO daily 02/10/23   Rancour, Jeannett Senior, MD  sucralfate (CARAFATE) 1 g tablet Take 1 tablet (1 g total) by mouth 4 (four) times daily -  with meals and at bedtime. 02/11/23   Horton, Mayer Masker, MD  zonisamide (ZONEGRAN) 100 MG capsule Take 2 capsules (200 mg total) by mouth daily. 01/12/23   Lewie Chamber, MD      Allergies    Iron dextran, Aspirin, and Penicillins    Review of Systems   Review of Systems Negative except as per HPI Physical Exam Updated Vital Signs BP (!) 120/90 (BP Location: Right Arm)   Pulse (!) 106   Temp 98.5 F (36.9 C) (Oral)   Resp 15   LMP 01/08/2011   SpO2 95%  Physical Exam Vitals and nursing note reviewed.  Constitutional:      General: She is not in acute distress.    Appearance: She is well-developed. She is not diaphoretic.  HENT:     Head: Normocephalic and atraumatic.  Cardiovascular:     Rate and Rhythm: Regular rhythm.  Tachycardia present.  Pulmonary:     Effort: Pulmonary effort is normal.     Breath sounds: No decreased breath sounds or wheezing.  Abdominal:     Palpations: Abdomen is soft.     Tenderness: There is generalized abdominal tenderness.  Musculoskeletal:     Cervical back: No tenderness or bony tenderness.     Thoracic back: No tenderness or bony tenderness.     Lumbar back: No tenderness or bony tenderness.     Right lower leg: No tenderness. No edema.     Left lower leg: No tenderness. No edema.  Skin:    General: Skin is warm and dry.     Findings: No erythema or rash.  Neurological:     Mental  Status: She is alert and oriented to person, place, and time.  Psychiatric:        Behavior: Behavior normal.     ED Results / Procedures / Treatments   Labs (all labs ordered are listed, but only abnormal results are displayed) Labs Reviewed  CBC WITH DIFFERENTIAL/PLATELET - Abnormal; Notable for the following components:      Result Value   Hemoglobin 9.6 (*)    HCT 32.0 (*)    MCH 24.5 (*)    RDW 21.2 (*)    Lymphs Abs 0.4 (*)    All other components within normal limits  COMPREHENSIVE METABOLIC PANEL - Abnormal; Notable for the following components:   Sodium 134 (*)    Chloride 94 (*)    Glucose, Bld 339 (*)    Calcium 8.4 (*)    Albumin 3.2 (*)    All other components within normal limits  URINALYSIS, ROUTINE W REFLEX MICROSCOPIC - Abnormal; Notable for the following components:   Color, Urine STRAW (*)    APPearance CLOUDY (*)    Glucose, UA 150 (*)    All other components within normal limits  LIPASE, BLOOD    EKG EKG Interpretation Date/Time:  Thursday February 12 2023 03:27:25 EDT Ventricular Rate:  108 PR Interval:  119 QRS Duration:  78 QT Interval:  337 QTC Calculation: 452 R Axis:   41  Text Interpretation: Sinus tachycardia Left atrial enlargement Anteroseptal infarct, age indeterminate Confirmed by Gilda Crease [phone]) on 02/12/2023 4:26:10 AM  Radiology DG Chest 2 View  Result Date: 02/11/2023 CLINICAL DATA:  Shortness of breath and wheezing. EXAM: CHEST - 2 VIEW COMPARISON:  PA Lat yesterday at 3:05 a.m. FINDINGS: The lungs are clear. Heart size and vasculature are normal. The mediastinum is normally outlined. There is mild calcific plaque of the transverse aorta. There is osteopenia with thoracic kyphosis and chronic compression fractures of the T6 and T9 vertebral bodies. IMPRESSION: 1. No active cardiopulmonary disease. 2. Chronic compression fractures of the T6 and T9 vertebral bodies. Osteopenia. 3. Stable chest. Electronically Signed   By:  Almira Bar M.D.   On: 02/11/2023 03:05    Procedures Procedures    Medications Ordered in ED Medications  metFORMIN (GLUCOPHAGE) tablet 500 mg (500 mg Oral Given 02/12/23 0525)    ED Course/ Medical Decision Making/ A&P                                 Medical Decision Making Amount and/or Complexity of Data Reviewed Labs: ordered.   This patient presents to the ED for concern of abdominal and back pain, this involves an extensive number of treatment options, and  is a complaint that carries with it a high risk of complications and morbidity.  The differential diagnosis includes but not limited to gastritis, pancreatitis, cholecystitis   Co morbidities that complicate the patient evaluation  As listed above in HPI   Additional history obtained:  Additional history obtained from EMS, provided with nebulizer treatments with resolution of her shortness of breath/COPD exacerbation External records from outside source obtained and reviewed including prior labs on file for comparison, recent ER visit 24 hours ago. 02/11/2023 chest x-ray obtained showing chronic compression fractures of T6 and T9 vertebral bodies CBC with baseline chronic anemia hemoglobin 9.9. CMP with mildly elevated glucose at 182.  Lipase normal.   Lab Tests:  I Ordered, and personally interpreted labs.  The pertinent results include: CBC with hemoglobin 9.6, similar to prior on file.  Lipase normal.  CMP with elevated glucose at 339, not in DKA.  Urinalysis with glucose.   Problem List / ED Course / Critical interventions / Medication management  57 year old female presents with complaint of abdominal pain and back pain.  Numerous ER visits for same including 24 hours ago.  Labs reviewed from yesterday's visit, no significant findings.  Multiple risk factors however labs were reobtained today and do not show significant change.  Patient is requesting to leave.  She is not currently taking her metformin.   Prescription sent to her pharmacy, she requests 1 metformin prior to discharge which is administered however does not want to stay for repeat glucose.  Patient is advised that she should eat breakfast this morning and follow-up with her primary care provider. I ordered medication including metformin for hyperglycemia Reevaluation of the patient after these medicines showed that the patient  administered medication at time of discharge, patient does not want to stay in the ER further for repeat glucose check. I have reviewed the patients home medicines and have made adjustments as needed   Social Determinants of Health:  Has PCP, frequent ER visits   Test / Admission - Considered:  Stable for discharge with refill of metformin and plan to recheck with PCP.         Final Clinical Impression(s) / ED Diagnoses Final diagnoses:  COPD exacerbation (HCC)  Hyperglycemia  Generalized abdominal pain    Rx / DC Orders ED Discharge Orders          Ordered    metFORMIN (GLUCOPHAGE-XR) 750 MG 24 hr tablet  Daily        02/12/23 0515              Jeannie Fend, PA-C 02/12/23 0542    Gilda Crease, MD 02/12/23 [phone]

## 2023-02-12 NOTE — Discharge Instructions (Signed)
You were given a tablet of your metformin in the emergency room prior to discharge.  This is for your blood sugar.  A prescription for your metformin has been sent to your pharmacy to help control your blood sugar.  Please check your blood sugar at home.  Please follow-up with your family doctor.

## 2023-02-12 NOTE — ED Triage Notes (Signed)
Pt BIB [facility] from home c/o SOB and chronic back pain. Given 5 albuterol in route for wheezing. Pt on 3 L chronic nasal cannula. Current everyday smoker

## 2023-02-12 NOTE — ED Notes (Signed)
Patient has consumed 2 soda's and 1 cup ice without vomiting. Patient has ambulated multiple times to bathroom without oxygen and without assistance. Patient ambulates with normal gait.

## 2023-02-13 ENCOUNTER — Other Ambulatory Visit: Payer: Self-pay

## 2023-02-13 ENCOUNTER — Emergency Department (HOSPITAL_COMMUNITY)
Admission: EM | Admit: 2023-02-13 | Discharge: 2023-02-13 | Disposition: A | Discharge status: Home / Self Care | Payer: 59 | Attending: Emergency Medicine | Admitting: Emergency Medicine

## 2023-02-13 ENCOUNTER — Encounter (HOSPITAL_COMMUNITY): Payer: Self-pay

## 2023-02-13 DIAGNOSIS — Z7984 Long term (current) use of oral hypoglycemic drugs: Secondary | ICD-10-CM | POA: Insufficient documentation

## 2023-02-13 DIAGNOSIS — I10 Essential (primary) hypertension: Secondary | ICD-10-CM | POA: Insufficient documentation

## 2023-02-13 DIAGNOSIS — F17210 Nicotine dependence, cigarettes, uncomplicated: Secondary | ICD-10-CM | POA: Diagnosis not present

## 2023-02-13 DIAGNOSIS — R0602 Shortness of breath: Secondary | ICD-10-CM | POA: Diagnosis not present

## 2023-02-13 DIAGNOSIS — M549 Dorsalgia, unspecified: Secondary | ICD-10-CM | POA: Diagnosis not present

## 2023-02-13 DIAGNOSIS — R0689 Other abnormalities of breathing: Secondary | ICD-10-CM | POA: Diagnosis not present

## 2023-02-13 DIAGNOSIS — Z79899 Other long term (current) drug therapy: Secondary | ICD-10-CM | POA: Diagnosis not present

## 2023-02-13 DIAGNOSIS — R0600 Dyspnea, unspecified: Secondary | ICD-10-CM | POA: Diagnosis not present

## 2023-02-13 DIAGNOSIS — R069 Unspecified abnormalities of breathing: Secondary | ICD-10-CM | POA: Diagnosis not present

## 2023-02-13 DIAGNOSIS — E1165 Type 2 diabetes mellitus with hyperglycemia: Secondary | ICD-10-CM | POA: Diagnosis not present

## 2023-02-13 DIAGNOSIS — Z7951 Long term (current) use of inhaled steroids: Secondary | ICD-10-CM | POA: Diagnosis not present

## 2023-02-13 DIAGNOSIS — M545 Low back pain, unspecified: Secondary | ICD-10-CM | POA: Diagnosis not present

## 2023-02-13 DIAGNOSIS — Z743 Need for continuous supervision: Secondary | ICD-10-CM | POA: Diagnosis not present

## 2023-02-13 DIAGNOSIS — J449 Chronic obstructive pulmonary disease, unspecified: Secondary | ICD-10-CM | POA: Insufficient documentation

## 2023-02-13 LAB — CBC WITH DIFFERENTIAL/PLATELET
Abs Immature Granulocytes: 0.05 10*3/uL (ref 0.00–0.07)
Basophils Absolute: 0 10*3/uL (ref 0.0–0.1)
Basophils Relative: 1 %
Eosinophils Absolute: 0 10*3/uL (ref 0.0–0.5)
Eosinophils Relative: 0 %
HCT: 27.2 % — ABNORMAL LOW (ref 36.0–46.0)
Hemoglobin: 8.2 g/dL — ABNORMAL LOW (ref 12.0–15.0)
Immature Granulocytes: 1 %
Lymphocytes Relative: 12 %
Lymphs Abs: 0.9 10*3/uL (ref 0.7–4.0)
MCH: 23.9 pg — ABNORMAL LOW (ref 26.0–34.0)
MCHC: 30.1 g/dL (ref 30.0–36.0)
MCV: 79.3 fL — ABNORMAL LOW (ref 80.0–100.0)
Monocytes Absolute: 0.6 10*3/uL (ref 0.1–1.0)
Monocytes Relative: 8 %
Neutro Abs: 5.9 10*3/uL (ref 1.7–7.7)
Neutrophils Relative %: 78 %
Platelets: 313 10*3/uL (ref 150–400)
RBC: 3.43 MIL/uL — ABNORMAL LOW (ref 3.87–5.11)
RDW: 21 % — ABNORMAL HIGH (ref 11.5–15.5)
WBC: 7.5 10*3/uL (ref 4.0–10.5)
nRBC: 0 % (ref 0.0–0.2)

## 2023-02-13 LAB — BASIC METABOLIC PANEL
Anion gap: 16 — ABNORMAL HIGH (ref 5–15)
BUN: 6 mg/dL (ref 6–20)
CO2: 23 mmol/L (ref 22–32)
Calcium: 8.2 mg/dL — ABNORMAL LOW (ref 8.9–10.3)
Chloride: 95 mmol/L — ABNORMAL LOW (ref 98–111)
Creatinine, Ser: 0.46 mg/dL (ref 0.44–1.00)
GFR, Estimated: >60 mL/min (ref >60)
Glucose, Bld: 273 mg/dL — ABNORMAL HIGH (ref 70–99)
Potassium: 3.9 mmol/L (ref 3.5–5.1)
Sodium: 134 mmol/L — ABNORMAL LOW (ref 135–145)

## 2023-02-13 LAB — TROPONIN I (HIGH SENSITIVITY): Troponin I (High Sensitivity): 5 ng/L (ref <18)

## 2023-02-13 MED ORDER — METHYLPREDNISOLONE SODIUM SUCC 125 MG IJ SOLR
125.0000 mg | Freq: Once | INTRAMUSCULAR | Status: AC
  Administered 2023-02-13: 125 mg via INTRAVENOUS
  Filled 2023-02-13: qty 2

## 2023-02-13 MED ORDER — IPRATROPIUM-ALBUTEROL 0.5-2.5 (3) MG/3ML IN SOLN
3.0000 mL | Freq: Once | RESPIRATORY_TRACT | Status: AC
  Administered 2023-02-13: 3 mL via RESPIRATORY_TRACT
  Filled 2023-02-13: qty 3

## 2023-02-13 MED ORDER — ACETAMINOPHEN 325 MG PO TABS
650.0000 mg | ORAL_TABLET | Freq: Once | ORAL | Status: AC
  Administered 2023-02-13: 650 mg via ORAL
  Filled 2023-02-13: qty 2

## 2023-02-13 NOTE — ED Triage Notes (Signed)
Pt BIB [facility] from home with c/o back pain that woke up her up from sleep this morning. Upon wakening, pt smoked a cigarette and then couldn't breathe. 94% on room air and tachypneic on EMS arrival. Lung sounds clear but tight per EMS. Ambulatory on scene. Pt had wheezing after ambulating and was given duoneb en route. Pt also given 2 gm IV magnesium by EMS.  EMS Vitals: BP 132/80 HR 100 RR 24 O2 98% on nebulizer CBG not done  20 ga L hand

## 2023-02-13 NOTE — ED Provider Notes (Signed)
[name] Provider Note   CSN: [id number] Arrival date & time: 02/13/23  4403     History  Chief Complaint  Patient presents with   Shortness of Breath   Back Pain    [name] is a 57 y.o. female who is well-known to this department with 13 visits this month.  History of COPD, polysubstance use, type 2 diabetes, tobacco smoking, and schizoaffective disorder.  Patient with shortness of breath after smoking a cigarette earlier this morning.  Administered DuoNeb and 2 g of magnesium en route with EMS.  Also endorses chronic low back pain for which she does not take any medication at home.  In addition to the above listed history patient has a history of hypertension.  Albuterol inhaler pocket which she states she has been using with improvement.  HPI     Home Medications Prior to Admission medications   Medication Sig Start Date End Date Taking? Authorizing Provider  albuterol (VENTOLIN HFA) 108 (90 Base) MCG/ACT inhaler Inhale 1-2 puffs into the lungs every 4 (four) hours as needed for wheezing or shortness of breath. 12/24/22   [provider]  albuterol (VENTOLIN HFA) 108 (90 Base) MCG/ACT inhaler Inhale 1-2 puffs into the lungs every 6 (six) hours as needed for wheezing or shortness of breath. 02/10/23   Rancour, Jeannett Senior, MD  budesonide-formoterol (SYMBICORT) 160-4.5 MCG/ACT inhaler Inhale 2 puffs into the lungs 2 (two) times daily. Patient not taking: Reported on 01/11/2023 12/01/22   Maretta Bees, MD  famotidine (PEPCID) 20 MG tablet Take 1 tablet (20 mg total) by mouth daily. 01/24/23   Elayne Snare K, DO  famotidine (PEPCID) 20 MG tablet Take 1 tablet (20 mg total) by mouth 2 (two) times daily. 02/01/23   Glyn Ade, MD  hyoscyamine (LEVSIN/SL) 0.125 MG SL tablet Place 1 tablet (0.125 mg total) under the tongue every 4 (four) hours as needed (pain). Up to 1.25 mg daily 01/23/23   Arthor Captain, PA-C   metFORMIN (GLUCOPHAGE-XR) 750 MG 24 hr tablet Take 1 tablet (750 mg total) by mouth daily. 02/12/23 03/14/23  Jeannie Fend, PA-C  ondansetron (ZOFRAN) 4 MG tablet Take 1 tablet (4 mg total) by mouth every 6 (six) hours. 01/20/23   Prosperi, Christian H, PA-C  ondansetron (ZOFRAN-ODT) 4 MG disintegrating tablet Take 1 tablet (4 mg total) by mouth every 8 (eight) hours as needed for nausea or vomiting. 01/23/23   Arthor Captain, PA-C  OXYGEN Inhale 4-5 L into the lungs as needed (COPD).    [provider]  predniSONE (DELTASONE) 50 MG tablet 1 tablet PO daily 02/10/23   Rancour, Jeannett Senior, MD  sucralfate (CARAFATE) 1 g tablet Take 1 tablet (1 g total) by mouth 4 (four) times daily -  with meals and at bedtime. 02/11/23   Horton, Mayer Masker, MD  zonisamide (ZONEGRAN) 100 MG capsule Take 2 capsules (200 mg total) by mouth daily. 01/12/23   Lewie Chamber, MD      Allergies    Iron dextran, Aspirin, and Penicillins    Review of Systems   Review of Systems  Respiratory:  Positive for shortness of breath.   Cardiovascular:  Positive for chest pain.  Musculoskeletal:  Positive for back pain.    Physical Exam Updated Vital Signs BP (!) 105/59 (BP Location: Right Arm)   Pulse (!) 104   Temp 98.8 F (37.1 C)   Resp 19   LMP 01/08/2011   SpO2 97%  Physical Exam Vitals and nursing note reviewed.  Constitutional:      Appearance: She is not ill-appearing or toxic-appearing.  HENT:     Head: Normocephalic and atraumatic.     Mouth/Throat:     Mouth: Mucous membranes are moist.     Pharynx: No oropharyngeal exudate or posterior oropharyngeal erythema.  Eyes:     General:        Right eye: No discharge.        Left eye: No discharge.     Conjunctiva/sclera: Conjunctivae normal.  Cardiovascular:     Rate and Rhythm: Normal rate and regular rhythm.     Pulses: Normal pulses.  Pulmonary:     Effort: Pulmonary effort is normal. Tachypnea and prolonged expiration present. No bradypnea,  accessory muscle usage or respiratory distress.     Breath sounds: Examination of the right-middle field reveals wheezing. Examination of the left-middle field reveals wheezing. Examination of the right-lower field reveals wheezing. Examination of the left-lower field reveals wheezing. Wheezing present. No rales.  Chest:     Chest wall: No mass, tenderness or edema.  Abdominal:     General: Bowel sounds are normal. There is no distension.     Tenderness: There is no abdominal tenderness.  Musculoskeletal:        General: No deformity.     Cervical back: Neck supple.     Right lower leg: No tenderness. No edema.     Left lower leg: No tenderness. No edema.  Skin:    General: Skin is warm and dry.     Capillary Refill: Capillary refill takes less than 2 seconds.  Neurological:     General: No focal deficit present.     Mental Status: She is alert and oriented to person, place, and time. Mental status is at baseline.  Psychiatric:        Mood and Affect: Mood normal.     ED Results / Procedures / Treatments   Labs (all labs ordered are listed, but only abnormal results are displayed) Labs Reviewed  CBC WITH DIFFERENTIAL/PLATELET - Abnormal; Notable for the following components:      Result Value   RBC 3.43 (*)    Hemoglobin 8.2 (*)    HCT 27.2 (*)    MCV 79.3 (*)    MCH 23.9 (*)    RDW 21.0 (*)    All other components within normal limits  BASIC METABOLIC PANEL - Abnormal; Notable for the following components:   Sodium 134 (*)    Chloride 95 (*)    Glucose, Bld 273 (*)    Calcium 8.2 (*)    Anion gap 16 (*)    All other components within normal limits  TROPONIN I (HIGH SENSITIVITY)  TROPONIN I (HIGH SENSITIVITY)    EKG None  Radiology No results found.  Procedures Procedures    Medications Ordered in ED Medications  ipratropium-albuterol (DUONEB) 0.5-2.5 (3) MG/3ML nebulizer solution 3 mL (3 mLs Nebulization Given 02/13/23 0356)  methylPREDNISolone sodium  succinate (SOLU-MEDROL) 125 mg/2 mL injection 125 mg (125 mg Intravenous Given 02/13/23 0356)  acetaminophen (TYLENOL) tablet 650 mg (650 mg Oral Given 02/13/23 1478)    ED Course/ Medical Decision Making/ A&P                                Medical Decision Making 57 year old female who is well-known to this department who presents with concern for shortness  of breath after smoking cigarettes morning.  History of COPD.  On the tachycardic intake, but vital signs are otherwise normal. Cardiopulmonary exam with wheezing as above, abdominal exam is benign.  Amount and/or Complexity of Data Reviewed Labs: ordered.    Details: CBC without leukocytosis but with anemia with hemoglobin of 8 near patient's baseline of 9.  Troponin is normal, 5.  BMP with hyperglycemia of 273 mild elevation in anion gap to 16,  but otherwise unremarkable.  EKG bradycardia with heart rate of 106.   Risk OTC drugs. Prescription drug management.    [name] was reevaluated after DuoNeb in the ED with improvement in her wheezing.  Patient ambulatory to and from the restroom without difficulty in the emergency department.  Physical exam is reassuring.  Back pain that was reported is very vague in nature Tylenol offered.  Clinical concern for emergent underlying injury for this patient's symptoms that would warrant further ED workup or inpatient management is exceedingly low.  Steroid administered for possible UTI exacerbation.  Will provide contact information for calling community health clinic.  [name]  voiced understanding of her medical evaluation and treatment plan. Each of their questions answered to their expressed satisfaction.  Return precautions were given.  Patient is well-appearing, stable, and was discharged in good condition.  This chart was dictated using voice recognition software, Dragon. Despite the best efforts of this provider to proofread and correct errors, errors may still occur which can change  documentation meaning.   Final Clinical Impression(s) / ED Diagnoses Final diagnoses:  Shortness of breath    Rx / DC Orders ED Discharge Orders     None         Sherrilee Gilles 02/13/23 0622    Sabas Sous, MD 02/13/23 [phone]

## 2023-02-14 ENCOUNTER — Encounter (HOSPITAL_COMMUNITY): Payer: Self-pay | Admitting: Emergency Medicine

## 2023-02-14 ENCOUNTER — Encounter (HOSPITAL_COMMUNITY): Payer: Self-pay

## 2023-02-14 ENCOUNTER — Emergency Department (HOSPITAL_COMMUNITY): Payer: 59

## 2023-02-14 ENCOUNTER — Other Ambulatory Visit: Payer: Self-pay

## 2023-02-14 ENCOUNTER — Emergency Department (HOSPITAL_COMMUNITY)
Admission: EM | Admit: 2023-02-14 | Discharge: 2023-02-14 | Disposition: A | Discharge status: Home / Self Care | Payer: 59 | Attending: Emergency Medicine | Admitting: Emergency Medicine

## 2023-02-14 ENCOUNTER — Emergency Department (HOSPITAL_COMMUNITY)
Admission: EM | Admit: 2023-02-14 | Discharge: 2023-02-15 | Disposition: A | Discharge status: Home / Self Care | Payer: 59 | Source: Home / Self Care | Attending: Emergency Medicine | Admitting: Emergency Medicine

## 2023-02-14 DIAGNOSIS — Z7951 Long term (current) use of inhaled steroids: Secondary | ICD-10-CM | POA: Insufficient documentation

## 2023-02-14 DIAGNOSIS — J449 Chronic obstructive pulmonary disease, unspecified: Secondary | ICD-10-CM | POA: Insufficient documentation

## 2023-02-14 DIAGNOSIS — F17210 Nicotine dependence, cigarettes, uncomplicated: Secondary | ICD-10-CM | POA: Insufficient documentation

## 2023-02-14 DIAGNOSIS — I517 Cardiomegaly: Secondary | ICD-10-CM | POA: Diagnosis not present

## 2023-02-14 DIAGNOSIS — E039 Hypothyroidism, unspecified: Secondary | ICD-10-CM | POA: Insufficient documentation

## 2023-02-14 DIAGNOSIS — R000 Tachycardia, unspecified: Secondary | ICD-10-CM

## 2023-02-14 DIAGNOSIS — S22040A Wedge compression fracture of fourth thoracic vertebra, initial encounter for closed fracture: Secondary | ICD-10-CM

## 2023-02-14 DIAGNOSIS — R6889 Other general symptoms and signs: Secondary | ICD-10-CM | POA: Diagnosis not present

## 2023-02-14 DIAGNOSIS — R739 Hyperglycemia, unspecified: Secondary | ICD-10-CM | POA: Diagnosis not present

## 2023-02-14 DIAGNOSIS — I10 Essential (primary) hypertension: Secondary | ICD-10-CM | POA: Diagnosis not present

## 2023-02-14 DIAGNOSIS — Z91148 Patient's other noncompliance with medication regimen for other reason: Secondary | ICD-10-CM | POA: Insufficient documentation

## 2023-02-14 DIAGNOSIS — I509 Heart failure, unspecified: Secondary | ICD-10-CM | POA: Diagnosis not present

## 2023-02-14 DIAGNOSIS — I499 Cardiac arrhythmia, unspecified: Secondary | ICD-10-CM | POA: Diagnosis not present

## 2023-02-14 DIAGNOSIS — R0602 Shortness of breath: Secondary | ICD-10-CM | POA: Insufficient documentation

## 2023-02-14 DIAGNOSIS — E119 Type 2 diabetes mellitus without complications: Secondary | ICD-10-CM | POA: Diagnosis not present

## 2023-02-14 DIAGNOSIS — R918 Other nonspecific abnormal finding of lung field: Secondary | ICD-10-CM | POA: Diagnosis not present

## 2023-02-14 DIAGNOSIS — M549 Dorsalgia, unspecified: Secondary | ICD-10-CM | POA: Insufficient documentation

## 2023-02-14 DIAGNOSIS — M545 Low back pain, unspecified: Secondary | ICD-10-CM | POA: Diagnosis not present

## 2023-02-14 DIAGNOSIS — Z96641 Presence of right artificial hip joint: Secondary | ICD-10-CM | POA: Insufficient documentation

## 2023-02-14 DIAGNOSIS — R059 Cough, unspecified: Secondary | ICD-10-CM | POA: Diagnosis not present

## 2023-02-14 DIAGNOSIS — Z743 Need for continuous supervision: Secondary | ICD-10-CM | POA: Diagnosis not present

## 2023-02-14 LAB — CBC
HCT: 25.9 % — ABNORMAL LOW (ref 36.0–46.0)
Hemoglobin: 7.9 g/dL — ABNORMAL LOW (ref 12.0–15.0)
MCH: 23.7 pg — ABNORMAL LOW (ref 26.0–34.0)
MCHC: 30.5 g/dL (ref 30.0–36.0)
MCV: 77.8 fL — ABNORMAL LOW (ref 80.0–100.0)
Platelets: 314 10*3/uL (ref 150–400)
RBC: 3.33 MIL/uL — ABNORMAL LOW (ref 3.87–5.11)
RDW: 20.8 % — ABNORMAL HIGH (ref 11.5–15.5)
WBC: 7.5 10*3/uL (ref 4.0–10.5)
nRBC: 0 % (ref 0.0–0.2)

## 2023-02-14 LAB — BASIC METABOLIC PANEL
Anion gap: 14 (ref 5–15)
BUN: 10 mg/dL (ref 6–20)
CO2: 22 mmol/L (ref 22–32)
Calcium: 8 mg/dL — ABNORMAL LOW (ref 8.9–10.3)
Chloride: 98 mmol/L (ref 98–111)
Creatinine, Ser: 0.58 mg/dL (ref 0.44–1.00)
GFR, Estimated: >60 mL/min (ref >60)
Glucose, Bld: 242 mg/dL — ABNORMAL HIGH (ref 70–99)
Potassium: 3.4 mmol/L — ABNORMAL LOW (ref 3.5–5.1)
Sodium: 134 mmol/L — ABNORMAL LOW (ref 135–145)

## 2023-02-14 LAB — TROPONIN I (HIGH SENSITIVITY): Troponin I (High Sensitivity): 6 ng/L (ref <18)

## 2023-02-14 MED ORDER — ACETAMINOPHEN 500 MG PO TABS
1000.0000 mg | ORAL_TABLET | Freq: Once | ORAL | Status: AC
  Administered 2023-02-14: 1000 mg via ORAL
  Filled 2023-02-14: qty 2

## 2023-02-14 MED ORDER — IOHEXOL 350 MG/ML SOLN
75.0000 mL | Freq: Once | INTRAVENOUS | Status: AC | PRN
  Administered 2023-02-14: 75 mL via INTRAVENOUS

## 2023-02-14 NOTE — ED Provider Notes (Signed)
[facility] Emergency Department Provider Note MRN:  [id number]  Arrival date & time: 02/14/23     Chief Complaint   Shortness of Breath (Pt BIB EMS for SOB and back pain, pt was seen in ED for same yesterday, pt on oxygen 3 LPM via [place], pt ambulatory with steady gait. Pt awake, alert and oriented, pt placed on stretcher in hallway, pt in NAD at this time, EMS reported that pt used her inhaler and nebs at home, EMS gave pt a duoneb enroute to ED> Pt reports pain since waking up yesterday) and Back Pain   History of Present Illness   [name] is a 57 y.o. year-old female with a history of COPD presenting to the ED with chief complaint of shortness of breath.  Patient called the ambulance for shortness of breath.  She is also having back pain.  Review of Systems  A thorough review of systems was obtained and all systems are negative except as noted in the HPI and PMH.   Patient's Health History    Past Medical History:  Diagnosis Date   Abdominal pain    Accidental drug overdose April 2013   Anxiety    Atrial fibrillation (HCC) 09/29/11   converted spontaneously   Chronic back pain    Chronic knee pain    Chronic nausea    Chronic pain    COPD (chronic obstructive pulmonary disease) (HCC)    Depression    Diabetes mellitus    states her doctor took her off all DM meds in past month   Diabetic neuropathy (HCC)    Dyspnea    with exertion    GERD (gastroesophageal reflux disease)    Headache(784.0)    migraines    HTN (hypertension)    not on meds since in a year    Hyperlipidemia    Hypothyroidism    not on meds in a while    Mental disorder    Bipolar and schizophrenic   Nausea and vomiting 01/02/2023   Requires supplemental oxygen    as needed per patient    Schizophrenia (HCC)    Schizophrenia, acute (HCC) 11/13/2017   Tobacco abuse     Past Surgical History:  Procedure Laterality Date   ABDOMINAL HYSTERECTOMY     BLADDER SUSPENSION   03/04/2011   Procedure: [facility] PROCEDURE;  Surgeon: Jonna Coup MacDiarmid;  Location: [facility] ORS;  Service: Urology;  Laterality: N/A;   BOWEL RESECTION N/A 04/18/2022   Procedure: SMALL BOWEL RESECTION;  Surgeon: Harriette Bouillon, MD;  Location: [facility] OR;  Service: General;  Laterality: N/A;   CYSTOCELE REPAIR  03/04/2011   Procedure: ANTERIOR REPAIR (CYSTOCELE);  Surgeon: Martina Sinner;  Location: [facility];  Service: Urology;  Laterality: N/A;   CYSTOSCOPY  03/04/2011   Procedure: CYSTOSCOPY;  Surgeon: Jonna Coup MacDiarmid;  Location: [facility];  Service: Urology;  Laterality: N/A;   ESOPHAGOGASTRODUODENOSCOPY (EGD) WITH PROPOFOL N/A 05/12/2017   Procedure: ESOPHAGOGASTRODUODENOSCOPY (EGD) WITH PROPOFOL;  Surgeon: Ovidio Kin, MD;  Location: [facility];  Service: General;  Laterality: N/A;   GASTRIC ROUX-EN-Y N/A 03/25/2016   Procedure: LAPAROSCOPIC ROUX-EN-Y GASTRIC BYPASS WITH UPPER ENDOSCOPY;  Surgeon: Glenna Fellows, MD;  Location: [facility];  Service: General;  Laterality: N/A;   KNEE SURGERY     LAPAROSCOPIC ASSISTED VAGINAL HYSTERECTOMY  03/04/2011   Procedure: LAPAROSCOPIC ASSISTED VAGINAL HYSTERECTOMY;  Surgeon: Jeani Hawking, MD;  Location: [facility];  Service: Gynecology;  Laterality: N/A;   LAPAROTOMY N/A  04/18/2022   Procedure: EXPLORATORY LAPAROTOMY;  Surgeon: Harriette Bouillon, MD;  Location: [facility];  Service: General;  Laterality: N/A;   LAPAROTOMY N/A 04/24/2022   Procedure: BRING BACK EXPLORATORY LAPAROTOMY;  Surgeon: Violeta Gelinas, MD;  Location: [facility] OR;  Service: General;  Laterality: N/A;   TOTAL HIP ARTHROPLASTY Right 08/27/2022   Procedure: TOTAL HIP ARTHROPLASTY;  Surgeon: Joen Laura, MD;  Location: [facility] OR;  Service: Orthopedics;  Laterality: Right;    Family History  Problem Relation Age of Onset   Heart attack Father        90s   Diabetes Mother    Heart disease Mother    Hypertension Mother    Heart attack Sister        26   COPD Other    Breast cancer Neg Hx      Social History   Socioeconomic History   Marital status: Legally Separated    Spouse name: Not on file   Number of children: Not on file   Years of education: Not on file   Highest education level: Not on file  Occupational History   Not on file  Tobacco Use   Smoking status: Every Day    Current packs/day: 1.00    Types: Cigarettes   Smokeless tobacco: Never  Vaping Use   Vaping status: Never Used  Substance and Sexual Activity   Alcohol use: Yes    Comment: 40 ounces daily   Drug use: Yes    Types: Cocaine, Marijuana, "Crack" cocaine    Comment: last use 01/19/23   Sexual activity: Yes    Partners: Male  Other Topics Concern   Not on file  Social History Narrative   Not on file   Social Determinants of Health   Financial Resource Strain: Not on file  Food Insecurity: No Food Insecurity (01/11/2023)   Hunger Vital Sign    Worried About Running Out of Food in the Last Year: Never true    Ran Out of Food in the Last Year: Never true  Transportation Needs: No Transportation Needs (01/11/2023)   PRAPARE - Administrator, Civil Service (Medical): No    Lack of Transportation (Non-Medical): No  Physical Activity: Not on file  Stress: Not on file  Social Connections: Unknown (01/06/2023)   Received from [facility]   Social Network    Social Network: Not on file  Intimate Partner Violence: Not At Risk (01/11/2023)   Humiliation, Afraid, Rape, and Kick questionnaire    Fear of Current or Ex-Partner: No    Emotionally Abused: No    Physically Abused: No    Sexually Abused: No     Physical Exam   Vitals:   02/14/23 0326 02/14/23 0618  BP: 118/89 115/62  Pulse: (!) 126   Resp: (!) 24 (!) 22  Temp: 98.7 F (37.1 C)   SpO2: 97% 96%    CONSTITUTIONAL: Well-appearing, NAD, appears anxious NEURO/PSYCH:  Alert and oriented x 3, no focal deficits EYES:  eyes equal and reactive ENT/NECK:  no LAD, no JVD CARDIO: Tachycardic rate, well-perfused, normal S1  and S2 PULM: Scattered wheezes, mild tachypnea GI/GU:  non-distended, non-tender MSK/SPINE:  No gross deformities, no edema SKIN:  no rash, atraumatic   *Additional and/or pertinent findings included in MDM below  Diagnostic and Interventional Summary    EKG Interpretation Date/Time:   Feb 14, 2023 at 04:43:54 Ventricular Rate:   125 PR Interval:   118 QRS Duration:   78  QT Interval:   314 QTC Calculation:  453 R Axis:      Text Interpretation:  Sinus tachycardia       Labs Reviewed  CBC - Abnormal; Notable for the following components:      Result Value   RBC 3.33 (*)    Hemoglobin 7.9 (*)    HCT 25.9 (*)    MCV 77.8 (*)    MCH 23.7 (*)    RDW 20.8 (*)    All other components within normal limits  BASIC METABOLIC PANEL  TROPONIN I (HIGH SENSITIVITY)    DG Chest 2 View  Final Result    CT Angio Chest Pulmonary Embolism (PE) W or WO Contrast    (Results Pending)    Medications  acetaminophen (TYLENOL) tablet 1,000 mg (1,000 mg Oral Given 02/14/23 0438)  iohexol (OMNIPAQUE) 350 MG/ML injection 75 mL (75 mLs Intravenous Contrast Given 02/14/23 0641)     Procedures  /  Critical Care Procedures  ED Course and Medical Decision Making  Initial Impression and Ddx This is the patient's 15th emergency department visit in August alone.  Frequently comes in for shortness of breath.  Patient is quite tachycardic on this visit, up to 126.  On her baseline 3 L nasal cannula.  No fever.  Mildly tachypneic.  Having back pain as well which she says is common for her.  Past medical/surgical history that increases complexity of ED encounter: COPD  Interpretation of Diagnostics I personally reviewed the EKG and my interpretation is as follows: Sinus tachycardia  Chest x-ray without pneumothorax, radiology commenting on new opacity that may represent pulmonary embolism.  Patient Reassessment and Ultimate Disposition/Management     Awaiting labs, CT PE study.  Signed out to  oncoming provider at shift change.  Patient management required discussion with the following services or consulting groups:  None  Complexity of Problems Addressed Acute illness or injury that poses threat of life of bodily function  Additional Data Reviewed and Analyzed Further history obtained from: None, Prior ED visit notes, and Prior labs/imaging results  Additional Factors Impacting ED Encounter Risk None  Elmer Sow. [name], MD [facility] Emergency Medicine [facility] [email or web address]  Final Clinical Impressions(s) / ED Diagnoses     ICD-10-CM   1. Acute back pain, unspecified back location, unspecified back pain laterality  M54.9     2. SOB (shortness of breath)  R06.02       ED Discharge Orders     None        Discharge Instructions Discussed with and Provided to Patient:   Discharge Instructions   None      Sabas Sous, MD 02/14/23 [phone]

## 2023-02-14 NOTE — ED Provider Notes (Signed)
Patient signed out to me at 07 100 by Dr. [name] pending labs and CT PE study.  In short this is a 58 year old female with a past medical history of COPD on 3 L nasal cannula at baseline presenting to the emergency department with back pain and shortness of breath.  Of note she has been seen in the ED multiple times over the last month and for the same complaints yesterday.  On her arrival here she was tachycardic and mildly tachypneic, satting well on her home O2.  Initial x-ray showed concern for consolidation or possible pulmonary infarct and was recommended further workup with CT PE study which is pending at this time.  EKG on arrival showed sinus tachycardia.  Patient's labs showed mild anemia at her baseline, troponin negative.  Symptoms have been ongoing for several days a single troponin is sufficient.  Labs showed mild hyperglycemia without evidence of DKA.  CT PE study was negative for PE and showed no evidence of acute lung pathology.  Did show evidence of age-indeterminate T4 and T6 compression fractures new from June which is likely explanation for her pain.  Clinical Course as of 02/14/23 0739  Sat Feb 14, 2023  0630 Patient still mildly tachycardic but has been ambulating around the department and is requesting staying to be discharged.  She is in no acute distress and is well-appearing.  She has no midline back tenderness and is moving all 4 extremities normally, ambulating normally. She was recommended close primary care follow-up and was given strict return precautions. [VK]    Clinical Course User Index [VK] Rexford Maus, DO      Rexford Maus, [place] 02/14/23 [phone]

## 2023-02-14 NOTE — ED Notes (Signed)
DC instructions reviewed with pt.  Pt verbalized understanding. Pt Dc.

## 2023-02-14 NOTE — ED Notes (Signed)
Patient transported to CT 

## 2023-02-14 NOTE — ED Triage Notes (Signed)
Pt to ED by EMS from home with c/o back pain. Pt was seen here last night and the night before from the same. Per EMS Pt had a 40oz beer and a pack of cigarettes sitting next to her when they arrived at her residence. Pt had a full negative workup yesterday. Pt smells like etoh upon arrival. VSS, NADN.

## 2023-02-14 NOTE — Discharge Instructions (Signed)
You were seen in the emergency department for your back pain and your shortness of breath.  Your workup showed no signs of blood clots in your lungs or pneumonia no signs of stress on your heart.  You do have fractures in your mid back at T4 and T6 that are new compared to your last imaging in June and this may be with causing your back pain.  You can continue to take Tylenol and Motrin as needed for pain.  Your heart rate was also elevated in the emergency department.  You should follow-up with your primary doctor to have your heart rate and your back reassessed.  You should return to the emergency department if you are having numbness or weakness in your arms or legs, significantly worsening shortness of breath, severe chest pain or any other new or concerning symptoms.

## 2023-02-15 ENCOUNTER — Emergency Department (HOSPITAL_COMMUNITY): Payer: 59

## 2023-02-15 DIAGNOSIS — I509 Heart failure, unspecified: Secondary | ICD-10-CM | POA: Diagnosis not present

## 2023-02-15 DIAGNOSIS — R0602 Shortness of breath: Secondary | ICD-10-CM | POA: Diagnosis not present

## 2023-02-15 DIAGNOSIS — I517 Cardiomegaly: Secondary | ICD-10-CM | POA: Diagnosis not present

## 2023-02-15 DIAGNOSIS — R918 Other nonspecific abnormal finding of lung field: Secondary | ICD-10-CM | POA: Diagnosis not present

## 2023-02-15 MED ORDER — ALBUTEROL SULFATE HFA 108 (90 BASE) MCG/ACT IN AERS
2.0000 | INHALATION_SPRAY | Freq: Once | RESPIRATORY_TRACT | Status: AC
  Administered 2023-02-15: 2 via RESPIRATORY_TRACT
  Filled 2023-02-15: qty 6.7

## 2023-02-15 NOTE — Discharge Instructions (Signed)
Your evaluation in the ED today was reassuring.  We recommend follow-up with your primary care doctor.  Continue your daily prescribed medications.

## 2023-02-15 NOTE — ED Provider Notes (Signed)
[place] EMERGENCY DEPARTMENT AT [facility] Provider Note   CSN: [id number] Arrival date & time: 02/14/23  2210     History  Chief Complaint  Patient presents with   Back Pain    [name] is a 57 y.o. female.  57 year old female with history of COPD, esophageal reflux, atrial fibrillation, schizophrenia, chronic back pain presents to the emergency department complaining of shortness of breath.  She was discharged from the ED less than 24 hours ago after evaluation for similar complaints.  Arrived to the ED without supplemental oxygen; is supposed to be on chronic 3 L via nasal cannula.  Per EMS, was noted to have a 40 ounce beer and a pack of cigarettes sitting next to her.  She reports noncompliance with all of her daily medications; states she "doesn't have them"  The history is provided by the patient. No language interpreter was used.  Back Pain      Home Medications Prior to Admission medications   Medication Sig Start Date End Date Taking? Authorizing Provider  albuterol (VENTOLIN HFA) 108 (90 Base) MCG/ACT inhaler Inhale 1-2 puffs into the lungs every 4 (four) hours as needed for wheezing or shortness of breath. 12/24/22   [provider]  albuterol (VENTOLIN HFA) 108 (90 Base) MCG/ACT inhaler Inhale 1-2 puffs into the lungs every 6 (six) hours as needed for wheezing or shortness of breath. 02/10/23   Rancour, Jeannett Senior, MD  budesonide-formoterol (SYMBICORT) 160-4.5 MCG/ACT inhaler Inhale 2 puffs into the lungs 2 (two) times daily. Patient not taking: Reported on 01/11/2023 12/01/22   Maretta Bees, MD  famotidine (PEPCID) 20 MG tablet Take 1 tablet (20 mg total) by mouth daily. 01/24/23   Elayne Snare K, DO  famotidine (PEPCID) 20 MG tablet Take 1 tablet (20 mg total) by mouth 2 (two) times daily. 02/01/23   Glyn Ade, MD  hyoscyamine (LEVSIN/SL) 0.125 MG SL tablet Place 1 tablet (0.125 mg total) under the tongue every 4 (four) hours as  needed (pain). Up to 1.25 mg daily 01/23/23   Arthor Captain, PA-C  metFORMIN (GLUCOPHAGE-XR) 750 MG 24 hr tablet Take 1 tablet (750 mg total) by mouth daily. 02/12/23 03/14/23  Jeannie Fend, PA-C  ondansetron (ZOFRAN) 4 MG tablet Take 1 tablet (4 mg total) by mouth every 6 (six) hours. 01/20/23   Prosperi, Christian H, PA-C  ondansetron (ZOFRAN-ODT) 4 MG disintegrating tablet Take 1 tablet (4 mg total) by mouth every 8 (eight) hours as needed for nausea or vomiting. 01/23/23   Arthor Captain, PA-C  OXYGEN Inhale 4-5 L into the lungs as needed (COPD).    [provider]  predniSONE (DELTASONE) 50 MG tablet 1 tablet PO daily 02/10/23   Rancour, Jeannett Senior, MD  sucralfate (CARAFATE) 1 g tablet Take 1 tablet (1 g total) by mouth 4 (four) times daily -  with meals and at bedtime. 02/11/23   Horton, Mayer Masker, MD  zonisamide (ZONEGRAN) 100 MG capsule Take 2 capsules (200 mg total) by mouth daily. 01/12/23   Lewie Chamber, MD      Allergies    Iron dextran, Aspirin, and Penicillins    Review of Systems   Review of Systems  Musculoskeletal:  Positive for back pain.  Ten systems reviewed and are negative for acute change, except as noted in the HPI.    Physical Exam Updated Vital Signs BP 128/72   Pulse 91   Temp 98.8 F (37.1 C) (Oral)   Resp 16  LMP 01/08/2011   SpO2 100%   Physical Exam Vitals and nursing note reviewed.  Constitutional:      General: She is not in acute distress.    Appearance: She is well-developed. She is not diaphoretic.     Comments: Nontoxic appearing and in NAD  HENT:     Head: Normocephalic and atraumatic.  Eyes:     General: No scleral icterus.    Extraocular Movements: EOM normal.     Conjunctiva/sclera: Conjunctivae normal.  Cardiovascular:     Rate and Rhythm: Normal rate and regular rhythm.     Pulses: Normal pulses.  Pulmonary:     Effort: Pulmonary effort is normal. No respiratory distress.     Breath sounds: No stridor. Wheezing present. No  rales.     Comments: SpO2 87-92% on RA. Improved to 97-100% on 3L via [place]. Faint expiratory wheeze b/l. Musculoskeletal:        General: Normal range of motion.     Cervical back: Normal range of motion.  Skin:    General: Skin is warm and dry.     Coloration: Skin is not pale.     Findings: No erythema or rash.  Neurological:     Mental Status: She is alert and oriented to person, place, and time.     Coordination: Coordination normal.  Psychiatric:        Mood and Affect: Mood and affect normal.        Behavior: Behavior normal.     ED Results / Procedures / Treatments   Labs (all labs ordered are listed, but only abnormal results are displayed) Labs Reviewed - No data to display  EKG EKG Interpretation Date/Time:  Sunday February 15 2023 01:44:50 EDT Ventricular Rate:  101 PR Interval:  120 QRS Duration:  91 QT Interval:  347 QTC Calculation: 450 R Axis:   16  Text Interpretation: Sinus tachycardia Ventricular premature complex Aberrant complex Left atrial enlargement Low voltage, precordial leads Abnrm T, consider ischemia, anterolateral lds No significant change was found Confirmed by Glynn Octave [phone]) on 02/15/2023 2:32:09 AM  Radiology DG Chest Port 1 View  Result Date: 02/15/2023 CLINICAL DATA:  Shortness of breath. EXAM: PORTABLE CHEST 1 VIEW COMPARISON:  CTA chest yesterday at 6:32 a.m., portable chest yesterday at 4:21 a.m. FINDINGS: There is a relatively low inspiration on exam. Interstitial haziness in the lung bases could be due to low lung volumes or early pneumonitis. The sulci are sharp. There is mild cardiomegaly without evidence of CHF. The mediastinum is normally outlined. Thoracic spondylosis. IMPRESSION: 1. Interstitial haziness in the lung bases, could be due to low lung volumes or early pneumonitis. Follow-up study recommended in full inspiration. 2. Mild cardiomegaly without evidence of CHF. Electronically Signed   By: Almira Bar M.D.   On:  02/15/2023 01:42   CT Angio Chest Pulmonary Embolism (PE) W or WO Contrast  Result Date: 02/14/2023 CLINICAL DATA:  Pulmonary embolism suspected. High probability. New opacity noted today in the peripheral left lung base on chest radiograph. EXAM: CT ANGIOGRAPHY CHEST WITH CONTRAST TECHNIQUE: Multidetector CT imaging of the chest was performed using the standard protocol during bolus administration of intravenous contrast. Multiplanar CT image reconstructions and MIPs were obtained to evaluate the vascular anatomy. RADIATION DOSE REDUCTION: This exam was performed according to the departmental dose-optimization program which includes automated exposure control, adjustment of the mA and/or kV according to patient size and/or use of iterative reconstruction technique. CONTRAST:  75mL OMNIPAQUE IOHEXOL 350  MG/ML SOLN COMPARISON:  AP Lat chest today, AP Lat chest 02/11/2023, AP Lat chest 02/10/2023, CTA chest 12/16/2022 and CTA chest 12/05/2022. FINDINGS: Cardiovascular: There is mild cardiomegaly, scattered calcific plaque LAD and right coronary arteries. No substantial pericardial effusion. Pulmonary veins are normal caliber. No arterial embolism is seen. The pulmonary trunk again measures prominent at 3.4 cm indicating arterial hypertension. There is no evidence of acute right heart strain. There is mild aortic atherosclerosis without aneurysm, dissection or stenosis. The great vessels are clear, with innominate trunk tortuosity. Mediastinum/Nodes: No enlarged mediastinal, hilar, or axillary lymph nodes. Thyroid gland, trachea, and esophagus demonstrate no significant findings. Small hiatal hernia. Lungs/Pleura: There previously were small coarsely nodular opacities in the superior segment of the left lower lobe cough which have resolved, and presumably were inflammatory nodules. There is mild posterior atelectasis both lung bases. There is no lung infiltrate corresponding to the hazy opacity seen in the lateral  left base on the chest x-ray earlier today. I believe this was simple chest wall attenuation. There is diffuse bronchial thickening. Scattered subsegmental lower lobe bronchial plugging. There is mild paraseptal emphysema in the upper lobes. There is no focal pneumonic infiltrate or nodule today. No pleural effusion, thickening or pneumothorax. 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  No acute findings. Musculoskeletal: Generalized osteopenia. There is a mild increased bow tie configuration of the T4 vertebral body with a biconcave appearance of the endplates compared to the prior studies. Loss of central vertebral height is about 25% today but no significant anterior or posterior height loss is seen. This is age-indeterminate but was not seen on 12/16/2022. There is also a mild increased new wedge compression fracture along the upper plate of T6, with loss of anterior height about 20% without retropulsion and with fragmentation along the mid to anterior superior endplate. There are no change in moderate chronic T9 compression fracture. Remaining vertebra are normal in heights. The ribcage is intact. Review of the MIP images confirms the above findings. IMPRESSION: 1. No evidence of arterial embolus. 2. Chronically prominent pulmonary trunk indicating arterial hypertension. No evidence of acute right heart strain. 3. Cardiomegaly with calcific CAD and aortic atherosclerosis. 4. COPD with diffuse bronchial thickening and scattered subsegmental lower lobe bronchial plugging. No focal infiltrate. 5. No lung infiltrate corresponding to the hazy opacity seen in the lateral left base on the chest x-ray earlier today. I believe this was simple chest wall attenuation. 6. Mild increased T4 central compression fracture with biconcave endplates, and mild increased T6 upper endplate wedge compression fracture. These are age-indeterminate but new from 12/16/2022. 7. Small hiatal hernia. 8. 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 9. Previously noted  coarsely nodular opacities in the superior segment of the left lower lobe have resolved, presumably were inflammatory nodules. Aortic Atherosclerosis (ICD10-I70.0) and Emphysema (ICD10-J43.9). Electronically Signed   By: Almira Bar M.D.   On: 02/14/2023 07:07   DG Chest 2 View  Result Date: 02/14/2023 CLINICAL DATA:  57 year old female with history of shortness of breath. Back pain. EXAM: CHEST - 2 VIEW COMPARISON:  Chest x-ray 02/11/2023. FINDINGS: Ill-defined opacity in the lateral aspect of the left lung base obscuring the lateral left costophrenic sulcus. Right lung is clear. No right pleural effusion. No pneumothorax. No evidence of pulmonary edema. Heart size is normal. Upper mediastinal contours are within normal limits. IMPRESSION: 1. New opacity in the periphery of the left lung base. In the appropriate clinical setting this may simply represent airspace consolidation from pneumonia, however, pulmonary infarct can have a  similar appearance. If there is clinical concern for acute pulmonary infarction, further evaluation with PE protocol CT scan should be considered. At the very least, followup PA and lateral chest X-ray is recommended in 3-4 weeks following trial of antibiotic therapy to ensure resolution and exclude underlying malignancy. Electronically Signed   By: Trudie Reed M.D.   On: 02/14/2023 05:26    Procedures Procedures    Medications Ordered in ED Medications  albuterol (VENTOLIN HFA) 108 (90 Base) MCG/ACT inhaler 2 puff (2 puffs Inhalation Given 02/15/23 0141)    ED Course/ Medical Decision Making/ A&P                                 Medical Decision Making Amount and/or Complexity of Data Reviewed Radiology: ordered. ECG/medicine tests: ordered.  Risk Prescription drug management.   This patient presents to the ED for concern of SOB, this involves an extensive number of treatment options, and is a complaint that carries with it a high risk of complications  and morbidity.  The differential diagnosis includes COPD exacerbation vs anxiety vs reflux vs PNA vs PTX vs Afib w/RVR   Co morbidities that complicate the patient evaluation  COPD Afib Schizophrenia   Additional history obtained:  Additional history obtained from EMS External records from outside source obtained and reviewed including CTA chest from yesterday which was negative for PE.   Imaging Studies ordered:  I ordered imaging studies including CXR  I independently visualized and interpreted imaging which showed no acute change since imaging yesterday I agree with the radiologist interpretation   Cardiac Monitoring:  The patient was maintained on a cardiac monitor.  I personally viewed and interpreted the cardiac monitored which showed an underlying rhythm of: NSR   Medicines ordered and prescription drug management:  I ordered medication including albuterol inhaler for SOB  Reevaluation of the patient after these medicines showed that the patient improved I have reviewed the patients home medicines and have made adjustments as needed   Test Considered:  Chem-8   Problem List / ED Course:  57 year old female presenting to the emergency department complaining of shortness of breath.  She has a history of frequent ED utilization, often for these similar complaints. Patient was not on her chronic oxygen when EMS arrived.  This was applied once in the emergency department.  Patient maintaining normal oxygen saturations on her chronic O2. Minimal expiratory wheezing for which patient was given an albuterol inhaler.  Chest x-ray ordered for trending.  I have viewed and interpreted this imaging which appears stable compared to x-ray done less than 24 hours ago. The patient was seen in the emergency department yesterday for these complaints as well with a very thorough evaluation.  She had a CTA of her chest which excluded pulmonary embolus.  There is no clinical concern for  pneumonia at present. No clinical decompensation over prolonged observation in the emergency department.   Reevaluation:  After the interventions noted above, I reevaluated the patient and found that they have :stayed the same   Social Determinants of Health:  Polysubstance abuse hx   Dispostion:  After consideration of the diagnostic results and the patients response to treatment, I feel that the patent would benefit from adherence to her home medications and continued use of supplemental O2. Encouraged PCP f/u. Return precautions provided. Patient discharged in stable condition with no unaddressed concerns.  Final Clinical Impression(s) / ED Diagnoses Final diagnoses:  Shortness of breath    Rx / DC Orders ED Discharge Orders     None         Antony Madura, PA-C 02/15/23 0528    Glynn Octave, MD 02/15/23 [phone]

## 2023-02-19 DIAGNOSIS — M5440 Lumbago with sciatica, unspecified side: Secondary | ICD-10-CM | POA: Diagnosis not present

## 2023-02-19 DIAGNOSIS — Z0289 Encounter for other administrative examinations: Secondary | ICD-10-CM | POA: Diagnosis not present

## 2023-02-21 ENCOUNTER — Emergency Department (HOSPITAL_COMMUNITY)
Admission: EM | Admit: 2023-02-21 | Discharge: 2023-02-21 | Discharge status: Against Medical Advice | Payer: 59 | Source: Home / Self Care

## 2023-02-21 ENCOUNTER — Encounter (HOSPITAL_COMMUNITY): Payer: Self-pay

## 2023-02-21 ENCOUNTER — Emergency Department (HOSPITAL_COMMUNITY)
Admission: EM | Admit: 2023-02-21 | Discharge: 2023-02-21 | Discharge status: Against Medical Advice | Payer: 59 | Source: Home / Self Care | Attending: Emergency Medicine | Admitting: Emergency Medicine

## 2023-02-21 ENCOUNTER — Emergency Department (HOSPITAL_COMMUNITY): Payer: 59

## 2023-02-21 ENCOUNTER — Other Ambulatory Visit: Payer: Self-pay

## 2023-02-21 DIAGNOSIS — Z7984 Long term (current) use of oral hypoglycemic drugs: Secondary | ICD-10-CM | POA: Insufficient documentation

## 2023-02-21 DIAGNOSIS — Z7951 Long term (current) use of inhaled steroids: Secondary | ICD-10-CM | POA: Insufficient documentation

## 2023-02-21 DIAGNOSIS — R062 Wheezing: Secondary | ICD-10-CM | POA: Diagnosis not present

## 2023-02-21 DIAGNOSIS — M549 Dorsalgia, unspecified: Secondary | ICD-10-CM | POA: Diagnosis not present

## 2023-02-21 DIAGNOSIS — R6889 Other general symptoms and signs: Secondary | ICD-10-CM | POA: Diagnosis not present

## 2023-02-21 DIAGNOSIS — R079 Chest pain, unspecified: Secondary | ICD-10-CM | POA: Insufficient documentation

## 2023-02-21 DIAGNOSIS — I4891 Unspecified atrial fibrillation: Secondary | ICD-10-CM

## 2023-02-21 DIAGNOSIS — I499 Cardiac arrhythmia, unspecified: Secondary | ICD-10-CM | POA: Diagnosis not present

## 2023-02-21 DIAGNOSIS — R0602 Shortness of breath: Secondary | ICD-10-CM

## 2023-02-21 DIAGNOSIS — Z5329 Procedure and treatment not carried out because of patient's decision for other reasons: Secondary | ICD-10-CM | POA: Insufficient documentation

## 2023-02-21 DIAGNOSIS — Z743 Need for continuous supervision: Secondary | ICD-10-CM | POA: Diagnosis not present

## 2023-02-21 DIAGNOSIS — R1084 Generalized abdominal pain: Secondary | ICD-10-CM | POA: Diagnosis not present

## 2023-02-21 DIAGNOSIS — R0689 Other abnormalities of breathing: Secondary | ICD-10-CM | POA: Diagnosis not present

## 2023-02-21 DIAGNOSIS — R000 Tachycardia, unspecified: Secondary | ICD-10-CM | POA: Diagnosis not present

## 2023-02-21 LAB — BASIC METABOLIC PANEL
Anion gap: 11 (ref 5–15)
BUN: 7 mg/dL (ref 6–20)
CO2: 18 mmol/L — ABNORMAL LOW (ref 22–32)
Calcium: 8.5 mg/dL — ABNORMAL LOW (ref 8.9–10.3)
Chloride: 104 mmol/L (ref 98–111)
Creatinine, Ser: 0.46 mg/dL (ref 0.44–1.00)
GFR, Estimated: >60 mL/min (ref >60)
Glucose, Bld: 181 mg/dL — ABNORMAL HIGH (ref 70–99)
Potassium: 3.9 mmol/L (ref 3.5–5.1)
Sodium: 133 mmol/L — ABNORMAL LOW (ref 135–145)

## 2023-02-21 LAB — CBC
HCT: 33.6 % — ABNORMAL LOW (ref 36.0–46.0)
Hemoglobin: 10.1 g/dL — ABNORMAL LOW (ref 12.0–15.0)
MCH: 23.6 pg — ABNORMAL LOW (ref 26.0–34.0)
MCHC: 30.1 g/dL (ref 30.0–36.0)
MCV: 78.5 fL — ABNORMAL LOW (ref 80.0–100.0)
Platelets: 328 10*3/uL (ref 150–400)
RBC: 4.28 MIL/uL (ref 3.87–5.11)
RDW: 20.5 % — ABNORMAL HIGH (ref 11.5–15.5)
WBC: 6.9 10*3/uL (ref 4.0–10.5)
nRBC: 0 % (ref 0.0–0.2)

## 2023-02-21 LAB — TROPONIN I (HIGH SENSITIVITY): Troponin I (High Sensitivity): 8 ng/L (ref <18)

## 2023-02-21 MED ORDER — DILTIAZEM HCL-DEXTROSE 125-5 MG/125ML-% IV SOLN (PREMIX): 5.0000 mg/h | INTRAVENOUS | Status: DC

## 2023-02-21 MED ORDER — DILTIAZEM HCL 25 MG/5ML IV SOLN
10.0000 mg | Freq: Once | INTRAVENOUS | Status: AC
  Administered 2023-02-21: 10 mg via INTRAVENOUS
  Filled 2023-02-21: qty 5

## 2023-02-21 MED ORDER — ALUM & MAG HYDROXIDE-SIMETH 200-200-20 MG/5ML PO SUSP: 30.0000 mL | Freq: Once | ORAL | Status: DC

## 2023-02-21 MED ORDER — SODIUM CHLORIDE 0.9 % IV BOLUS
1000.0000 mL | Freq: Once | INTRAVENOUS | Status: AC
  Administered 2023-02-21: 1000 mL via INTRAVENOUS

## 2023-02-21 NOTE — ED Provider Notes (Signed)
[place] EMERGENCY DEPARTMENT AT [facility] Provider Note   CSN: [id number] Arrival date & time: 02/21/23  0423     History  Chief Complaint  Patient presents with   Shortness of Breath    [name] is a 57 y.o. female.   Shortness of Breath  57 y.o. F presenting to the ED for SOB.  Apparently went to the bathroom tonight and had some trouble breathing so EMS was called.  She was given duoneb en route.  Upon arrival to the ED she is agitated and not wanting to stay for evaluation.  Home Medications Prior to Admission medications   Medication Sig Start Date End Date Taking? Authorizing Provider  albuterol (VENTOLIN HFA) 108 (90 Base) MCG/ACT inhaler Inhale 1-2 puffs into the lungs every 4 (four) hours as needed for wheezing or shortness of breath. 12/24/22   [provider]  albuterol (VENTOLIN HFA) 108 (90 Base) MCG/ACT inhaler Inhale 1-2 puffs into the lungs every 6 (six) hours as needed for wheezing or shortness of breath. 02/10/23   Rancour, Jeannett Senior, MD  budesonide-formoterol (SYMBICORT) 160-4.5 MCG/ACT inhaler Inhale 2 puffs into the lungs 2 (two) times daily. Patient not taking: Reported on 01/11/2023 12/01/22   Maretta Bees, MD  famotidine (PEPCID) 20 MG tablet Take 1 tablet (20 mg total) by mouth daily. 01/24/23   Elayne Snare K, DO  famotidine (PEPCID) 20 MG tablet Take 1 tablet (20 mg total) by mouth 2 (two) times daily. 02/01/23   Glyn Ade, MD  hyoscyamine (LEVSIN/SL) 0.125 MG SL tablet Place 1 tablet (0.125 mg total) under the tongue every 4 (four) hours as needed (pain). Up to 1.25 mg daily 01/23/23   Arthor Captain, PA-C  metFORMIN (GLUCOPHAGE-XR) 750 MG 24 hr tablet Take 1 tablet (750 mg total) by mouth daily. 02/12/23 03/14/23  Jeannie Fend, PA-C  ondansetron (ZOFRAN) 4 MG tablet Take 1 tablet (4 mg total) by mouth every 6 (six) hours. 01/20/23   Prosperi, Christian H, PA-C  ondansetron (ZOFRAN-ODT) 4 MG disintegrating tablet  Take 1 tablet (4 mg total) by mouth every 8 (eight) hours as needed for nausea or vomiting. 01/23/23   Arthor Captain, PA-C  OXYGEN Inhale 4-5 L into the lungs as needed (COPD).    [provider]  predniSONE (DELTASONE) 50 MG tablet 1 tablet PO daily 02/10/23   Rancour, Jeannett Senior, MD  sucralfate (CARAFATE) 1 g tablet Take 1 tablet (1 g total) by mouth 4 (four) times daily -  with meals and at bedtime. 02/11/23   Horton, Mayer Masker, MD  zonisamide (ZONEGRAN) 100 MG capsule Take 2 capsules (200 mg total) by mouth daily. 01/12/23   Lewie Chamber, MD      Allergies    Iron dextran, Aspirin, and Penicillins    Review of Systems   Review of Systems  Respiratory:  Positive for shortness of breath.   All other systems reviewed and are negative.   Physical Exam Updated Vital Signs BP 125/63   Pulse (!) 104   Temp 98.2 F (36.8 C) (Oral)   Resp (!) 32   Ht 5\' 2"  (1.575 m)   Wt 49 kg   LMP 01/08/2011   SpO2 100%   BMI 19.76 kg/m   Physical Exam Vitals and nursing note reviewed.  Constitutional:      Comments: Standing at bedside, unhooking herself from monitoring  HENT:     Head: Normocephalic and atraumatic.  Pulmonary:     Comments: Talking on  cell phone in room, NAD noted, neb is blowing but mask is on the floor Musculoskeletal:     Cervical back: Normal range of motion and neck supple.     Comments: ambulatory  Neurological:     Mental Status: She is alert.     ED Results / Procedures / Treatments   Labs (all labs ordered are listed, but only abnormal results are displayed) Labs Reviewed - No data to display  EKG None  Radiology No results found.  Procedures Procedures    Medications Ordered in ED Medications - No data to display  ED Course/ Medical Decision Making/ A&P                                 Medical Decision Making  57 y.o. F here with SOB.  I went to evaluate patient shortly after arrival and she is standing at the bedside unhooking  herself from cardiac and pulse ox monitoring.  She is yelling on the phone stating that she is upset that she is here as she specifically asked EMS not to bring her here.  She refused heart and lung exam from me stating "I do not want nothing done".  She refused any further treatment or testing from ER staff. Patient gathered her belongings and ambulated out of the ED talking on cell phone.  She understands she can return here should she want evaluation.  Final Clinical Impression(s) / ED Diagnoses Final diagnoses:  Shortness of breath    Rx / DC Orders ED Discharge Orders     None         Garlon Hatchet, PA-C 02/21/23 0455    Marily Memos, MD 02/21/23 [phone]

## 2023-02-21 NOTE — ED Triage Notes (Addendum)
Patient BIB [facility] from home. Called out for difficulty breathing, back and abdominal pain. Noncompliant with a fib medication. Discharged from [facility] today.  EMS BP 86/40 170 HR a fib with RVR

## 2023-02-21 NOTE — ED Provider Notes (Signed)
[place] EMERGENCY DEPARTMENT AT [facility] Provider Note   CSN: [id number] Arrival date & time: 02/21/23  1702     History  Chief Complaint  Patient presents with   Tachycardia    [name] is a 57 y.o. female.  57 year old female with past medical history of schizophrenia and atrial fibrillation who is not adherent to her medication regimen presents the emergency department today with concern for chest pain.  The patient apparently was complaining of this and called EMS earlier.  She was initially taken to [facility] and left prior to any assessment.  She apparently was feeling worse at home and called again and was brought here for further evaluation.  The patient is a relatively poor historian.  She states that she is not taking any medications currently.  She states that she is having some pressure in the middle of her chest.  She also had 2 episodes of nausea and vomiting today.  She denies any hematemesis or melena.        Home Medications Prior to Admission medications   Medication Sig Start Date End Date Taking? Authorizing Provider  albuterol (VENTOLIN HFA) 108 (90 Base) MCG/ACT inhaler Inhale 1-2 puffs into the lungs every 4 (four) hours as needed for wheezing or shortness of breath. 12/24/22   [provider]  albuterol (VENTOLIN HFA) 108 (90 Base) MCG/ACT inhaler Inhale 1-2 puffs into the lungs every 6 (six) hours as needed for wheezing or shortness of breath. 02/10/23   Rancour, Jeannett Senior, MD  budesonide-formoterol (SYMBICORT) 160-4.5 MCG/ACT inhaler Inhale 2 puffs into the lungs 2 (two) times daily. Patient not taking: Reported on 01/11/2023 12/01/22   Maretta Bees, MD  famotidine (PEPCID) 20 MG tablet Take 1 tablet (20 mg total) by mouth daily. 01/24/23   Elayne Snare K, DO  famotidine (PEPCID) 20 MG tablet Take 1 tablet (20 mg total) by mouth 2 (two) times daily. 02/01/23   Glyn Ade, MD  hyoscyamine (LEVSIN/SL) 0.125 MG SL  tablet Place 1 tablet (0.125 mg total) under the tongue every 4 (four) hours as needed (pain). Up to 1.25 mg daily 01/23/23   Arthor Captain, PA-C  metFORMIN (GLUCOPHAGE-XR) 750 MG 24 hr tablet Take 1 tablet (750 mg total) by mouth daily. 02/12/23 03/14/23  Jeannie Fend, PA-C  ondansetron (ZOFRAN) 4 MG tablet Take 1 tablet (4 mg total) by mouth every 6 (six) hours. 01/20/23   Prosperi, Christian H, PA-C  ondansetron (ZOFRAN-ODT) 4 MG disintegrating tablet Take 1 tablet (4 mg total) by mouth every 8 (eight) hours as needed for nausea or vomiting. 01/23/23   Arthor Captain, PA-C  OXYGEN Inhale 4-5 L into the lungs as needed (COPD).    [provider]  predniSONE (DELTASONE) 50 MG tablet 1 tablet PO daily 02/10/23   Rancour, Jeannett Senior, MD  sucralfate (CARAFATE) 1 g tablet Take 1 tablet (1 g total) by mouth 4 (four) times daily -  with meals and at bedtime. 02/11/23   Horton, Mayer Masker, MD  zonisamide (ZONEGRAN) 100 MG capsule Take 2 capsules (200 mg total) by mouth daily. 01/12/23   Lewie Chamber, MD      Allergies    Iron dextran, Aspirin, and Penicillins    Review of Systems   Review of Systems  Reason unable to perform ROS: ROS questionably reliable due to baseline mental status.  Cardiovascular:  Positive for chest pain and palpitations.  All other systems reviewed and are negative.   Physical Exam  Updated Vital Signs BP 107/71   Pulse (!) 161   Temp 99.3 F (37.4 C) (Oral)   Resp (!) 27   Ht 5\' 2"  (1.575 m)   Wt 49 kg   LMP 01/08/2011   SpO2 93%   BMI 19.76 kg/m  Physical Exam Vitals and nursing note reviewed.   Gen: NAD Eyes: PERRL, EOMI HEENT: no oropharyngeal swelling Neck: trachea midline Resp: clear to auscultation bilaterally Card: Tachycardic, irregular Abd: nontender, nondistended Extremities: no calf tenderness, no edema Vascular: 2+ radial pulses bilaterally, 2+ DP pulses bilaterally Neuro: No focal deficits Skin: no rashes Psyc: acting  appropriately   ED Results / Procedures / Treatments   Labs (all labs ordered are listed, but only abnormal results are displayed) Labs Reviewed  BASIC METABOLIC PANEL  CBC  CBG MONITORING, ED  TROPONIN I (HIGH SENSITIVITY)    EKG None  Radiology No results found.  Procedures Procedures    Medications Ordered in ED Medications  sodium chloride 0.9 % bolus 1,000 mL (1,000 mLs Intravenous New Bag/Given 02/21/23 1718)  diltiazem (CARDIZEM) injection 10 mg (10 mg Intravenous Given 02/21/23 1718)    ED Course/ Medical Decision Making/ A&P                                 Medical Decision Making 57 year old female with past medical history of schizophrenia and atrial fibrillation is not adherent to her medication regimen presenting to the emergency department today with chest pain.  The patient does appear to be in atrial fibrillation with RVR here on the monitor.  Will give patient IV fluid bolus and obtain basic labs well as an EKG, chest x-ray, and troponin further evaluation for ACS, pulmonary edema, pulmonary infiltrates, pneumothorax.  I suspect this is likely due to her nonadherence to her medications.  I will give the patient a fluid bolus as well as 10 mg of IV Cardizem.  The patient's EKG interpreted by me shows what appears to be atrial fibrillation with RVR with a rate of 164 with nonspecific ST-T changes.  The patient's heart rate was improving with the Cardizem.  She was initially trying to leave AGAINST MEDICAL ADVICE and initially I did talk her into staying.  She ultimately decided to leave.  She does have capacity to make her own decisions.  She left AGAINST MEDICAL ADVICE.  Critical care time 38 minutes including review of old records, treatment of atrial fibrillation with RVR with IV diltiazem, reassessments while in the department   Amount and/or Complexity of Data Reviewed Labs: ordered. Radiology: ordered.  Risk OTC drugs. Prescription drug  management.           Final Clinical Impression(s) / ED Diagnoses Final diagnoses:  None    Rx / DC Orders ED Discharge Orders     None         Durwin Glaze, MD 02/21/23 2306

## 2023-02-21 NOTE — ED Notes (Signed)
Pt states "I told them not to bring me here" pt ambulated from ed with steady gait.

## 2023-02-21 NOTE — ED Triage Notes (Signed)
Pt bib gems pt reports waking up [place] to go to the bathroom and having trouble breathing. Ems found pt to be at 97% on room air . Ems gave douneb . Pt complaining of back opain.  Ems vs 116/76 Hr 98 Spo2 97%  Cbg 167  Gave 10mg  albuterol 0.5 mg atrovent

## 2023-02-21 NOTE — ED Notes (Signed)
Patient went back to room after causing scene in lobby, yelling and cursing at staff.  Patient told this RN, I don't want to be here, I told EMS not to bring me here, I want to leave.  Patient seen leaving ED again with steady gait.

## 2023-02-21 NOTE — ED Notes (Signed)
Pt demanded to leave. Pt explained AMA and risks of leaving. Pt verbalized understanding and left.

## 2023-02-22 ENCOUNTER — Other Ambulatory Visit (HOSPITAL_COMMUNITY): Payer: 59

## 2023-02-22 ENCOUNTER — Emergency Department (HOSPITAL_COMMUNITY): Payer: 59

## 2023-02-22 ENCOUNTER — Inpatient Hospital Stay (HOSPITAL_COMMUNITY)
Admission: EM | Admit: 2023-02-22 | Discharge: 2023-02-23 | DRG: 308 | Discharge status: Against Medical Advice | Payer: 59 | Attending: Internal Medicine | Admitting: Internal Medicine

## 2023-02-22 ENCOUNTER — Encounter (HOSPITAL_COMMUNITY): Payer: Self-pay | Admitting: Internal Medicine

## 2023-02-22 ENCOUNTER — Inpatient Hospital Stay (HOSPITAL_COMMUNITY): Payer: 59

## 2023-02-22 DIAGNOSIS — E785 Hyperlipidemia, unspecified: Secondary | ICD-10-CM | POA: Diagnosis present

## 2023-02-22 DIAGNOSIS — Z7951 Long term (current) use of inhaled steroids: Secondary | ICD-10-CM

## 2023-02-22 DIAGNOSIS — Z9884 Bariatric surgery status: Secondary | ICD-10-CM

## 2023-02-22 DIAGNOSIS — F209 Schizophrenia, unspecified: Secondary | ICD-10-CM | POA: Diagnosis present

## 2023-02-22 DIAGNOSIS — R069 Unspecified abnormalities of breathing: Secondary | ICD-10-CM | POA: Diagnosis not present

## 2023-02-22 DIAGNOSIS — F1010 Alcohol abuse, uncomplicated: Secondary | ICD-10-CM

## 2023-02-22 DIAGNOSIS — F1090 Alcohol use, unspecified, uncomplicated: Secondary | ICD-10-CM | POA: Diagnosis not present

## 2023-02-22 DIAGNOSIS — R000 Tachycardia, unspecified: Secondary | ICD-10-CM | POA: Diagnosis not present

## 2023-02-22 DIAGNOSIS — F17210 Nicotine dependence, cigarettes, uncomplicated: Secondary | ICD-10-CM | POA: Diagnosis not present

## 2023-02-22 DIAGNOSIS — Z5329 Procedure and treatment not carried out because of patient's decision for other reasons: Secondary | ICD-10-CM | POA: Diagnosis present

## 2023-02-22 DIAGNOSIS — R0602 Shortness of breath: Secondary | ICD-10-CM | POA: Diagnosis not present

## 2023-02-22 DIAGNOSIS — R002 Palpitations: Secondary | ICD-10-CM | POA: Diagnosis not present

## 2023-02-22 DIAGNOSIS — Z743 Need for continuous supervision: Secondary | ICD-10-CM | POA: Diagnosis not present

## 2023-02-22 DIAGNOSIS — J189 Pneumonia, unspecified organism: Secondary | ICD-10-CM | POA: Diagnosis present

## 2023-02-22 DIAGNOSIS — J441 Chronic obstructive pulmonary disease with (acute) exacerbation: Secondary | ICD-10-CM | POA: Diagnosis not present

## 2023-02-22 DIAGNOSIS — E872 Acidosis, unspecified: Secondary | ICD-10-CM | POA: Diagnosis not present

## 2023-02-22 DIAGNOSIS — K219 Gastro-esophageal reflux disease without esophagitis: Secondary | ICD-10-CM | POA: Diagnosis present

## 2023-02-22 DIAGNOSIS — Z880 Allergy status to penicillin: Secondary | ICD-10-CM | POA: Diagnosis not present

## 2023-02-22 DIAGNOSIS — E1165 Type 2 diabetes mellitus with hyperglycemia: Secondary | ICD-10-CM | POA: Diagnosis present

## 2023-02-22 DIAGNOSIS — Z7952 Long term (current) use of systemic steroids: Secondary | ICD-10-CM | POA: Diagnosis not present

## 2023-02-22 DIAGNOSIS — F1310 Sedative, hypnotic or anxiolytic abuse, uncomplicated: Secondary | ICD-10-CM | POA: Diagnosis not present

## 2023-02-22 DIAGNOSIS — R0989 Other specified symptoms and signs involving the circulatory and respiratory systems: Secondary | ICD-10-CM | POA: Diagnosis not present

## 2023-02-22 DIAGNOSIS — J449 Chronic obstructive pulmonary disease, unspecified: Secondary | ICD-10-CM | POA: Diagnosis not present

## 2023-02-22 DIAGNOSIS — Z96641 Presence of right artificial hip joint: Secondary | ICD-10-CM | POA: Diagnosis present

## 2023-02-22 DIAGNOSIS — R918 Other nonspecific abnormal finding of lung field: Secondary | ICD-10-CM | POA: Diagnosis not present

## 2023-02-22 DIAGNOSIS — F10229 Alcohol dependence with intoxication, unspecified: Secondary | ICD-10-CM | POA: Diagnosis present

## 2023-02-22 DIAGNOSIS — Z833 Family history of diabetes mellitus: Secondary | ICD-10-CM

## 2023-02-22 DIAGNOSIS — E039 Hypothyroidism, unspecified: Secondary | ICD-10-CM | POA: Diagnosis present

## 2023-02-22 DIAGNOSIS — E871 Hypo-osmolality and hyponatremia: Secondary | ICD-10-CM | POA: Diagnosis not present

## 2023-02-22 DIAGNOSIS — E1140 Type 2 diabetes mellitus with diabetic neuropathy, unspecified: Secondary | ICD-10-CM | POA: Diagnosis not present

## 2023-02-22 DIAGNOSIS — E119 Type 2 diabetes mellitus without complications: Secondary | ICD-10-CM | POA: Diagnosis not present

## 2023-02-22 DIAGNOSIS — Z635 Disruption of family by separation and divorce: Secondary | ICD-10-CM

## 2023-02-22 DIAGNOSIS — Y906 Blood alcohol level of 120-199 mg/100 ml: Secondary | ICD-10-CM | POA: Diagnosis present

## 2023-02-22 DIAGNOSIS — M47814 Spondylosis without myelopathy or radiculopathy, thoracic region: Secondary | ICD-10-CM | POA: Diagnosis not present

## 2023-02-22 DIAGNOSIS — Z8249 Family history of ischemic heart disease and other diseases of the circulatory system: Secondary | ICD-10-CM

## 2023-02-22 DIAGNOSIS — Y900 Blood alcohol level of less than 20 mg/100 ml: Secondary | ICD-10-CM | POA: Diagnosis not present

## 2023-02-22 DIAGNOSIS — Z90710 Acquired absence of both cervix and uterus: Secondary | ICD-10-CM | POA: Diagnosis not present

## 2023-02-22 DIAGNOSIS — I4891 Unspecified atrial fibrillation: Secondary | ICD-10-CM | POA: Diagnosis not present

## 2023-02-22 DIAGNOSIS — Z886 Allergy status to analgesic agent status: Secondary | ICD-10-CM | POA: Diagnosis not present

## 2023-02-22 DIAGNOSIS — I480 Paroxysmal atrial fibrillation: Secondary | ICD-10-CM | POA: Diagnosis not present

## 2023-02-22 DIAGNOSIS — Z888 Allergy status to other drugs, medicaments and biological substances status: Secondary | ICD-10-CM | POA: Diagnosis not present

## 2023-02-22 DIAGNOSIS — J440 Chronic obstructive pulmonary disease with acute lower respiratory infection: Secondary | ICD-10-CM | POA: Diagnosis present

## 2023-02-22 DIAGNOSIS — E860 Dehydration: Secondary | ICD-10-CM | POA: Diagnosis not present

## 2023-02-22 DIAGNOSIS — Z7984 Long term (current) use of oral hypoglycemic drugs: Secondary | ICD-10-CM | POA: Diagnosis not present

## 2023-02-22 DIAGNOSIS — R6889 Other general symptoms and signs: Secondary | ICD-10-CM | POA: Diagnosis not present

## 2023-02-22 DIAGNOSIS — R079 Chest pain, unspecified: Secondary | ICD-10-CM | POA: Diagnosis not present

## 2023-02-22 DIAGNOSIS — M546 Pain in thoracic spine: Secondary | ICD-10-CM | POA: Diagnosis not present

## 2023-02-22 DIAGNOSIS — I10 Essential (primary) hypertension: Secondary | ICD-10-CM | POA: Diagnosis not present

## 2023-02-22 DIAGNOSIS — R7989 Other specified abnormal findings of blood chemistry: Secondary | ICD-10-CM | POA: Diagnosis not present

## 2023-02-22 DIAGNOSIS — M47816 Spondylosis without myelopathy or radiculopathy, lumbar region: Secondary | ICD-10-CM | POA: Diagnosis not present

## 2023-02-22 DIAGNOSIS — J690 Pneumonitis due to inhalation of food and vomit: Secondary | ICD-10-CM | POA: Diagnosis present

## 2023-02-22 DIAGNOSIS — J9621 Acute and chronic respiratory failure with hypoxia: Secondary | ICD-10-CM

## 2023-02-22 DIAGNOSIS — I499 Cardiac arrhythmia, unspecified: Secondary | ICD-10-CM | POA: Diagnosis not present

## 2023-02-22 DIAGNOSIS — Z825 Family history of asthma and other chronic lower respiratory diseases: Secondary | ICD-10-CM

## 2023-02-22 DIAGNOSIS — M549 Dorsalgia, unspecified: Secondary | ICD-10-CM | POA: Diagnosis not present

## 2023-02-22 LAB — CBC
HCT: 27.4 % — ABNORMAL LOW (ref 36.0–46.0)
Hemoglobin: 8.2 g/dL — ABNORMAL LOW (ref 12.0–15.0)
MCH: 24 pg — ABNORMAL LOW (ref 26.0–34.0)
MCHC: 29.9 g/dL — ABNORMAL LOW (ref 30.0–36.0)
MCV: 80.4 fL (ref 80.0–100.0)
Platelets: 276 10*3/uL (ref 150–400)
RBC: 3.41 MIL/uL — ABNORMAL LOW (ref 3.87–5.11)
RDW: 20.2 % — ABNORMAL HIGH (ref 11.5–15.5)
WBC: 6.9 10*3/uL (ref 4.0–10.5)
nRBC: 0 % (ref 0.0–0.2)

## 2023-02-22 LAB — RAPID URINE DRUG SCREEN, HOSP PERFORMED
Amphetamines: NOT DETECTED
Barbiturates: NOT DETECTED
Benzodiazepines: NOT DETECTED
Cocaine: NOT DETECTED
Opiates: NOT DETECTED
Tetrahydrocannabinol: NOT DETECTED

## 2023-02-22 LAB — CBC WITH DIFFERENTIAL/PLATELET
Abs Immature Granulocytes: 0.03 10*3/uL (ref 0.00–0.07)
Basophils Absolute: 0 10*3/uL (ref 0.0–0.1)
Basophils Relative: 0 %
Eosinophils Absolute: 0 10*3/uL (ref 0.0–0.5)
Eosinophils Relative: 0 %
HCT: 30.3 % — ABNORMAL LOW (ref 36.0–46.0)
Hemoglobin: 8.7 g/dL — ABNORMAL LOW (ref 12.0–15.0)
Immature Granulocytes: 0 %
Lymphocytes Relative: 8 %
Lymphs Abs: 0.6 10*3/uL — ABNORMAL LOW (ref 0.7–4.0)
MCH: 24 pg — ABNORMAL LOW (ref 26.0–34.0)
MCHC: 28.7 g/dL — ABNORMAL LOW (ref 30.0–36.0)
MCV: 83.7 fL (ref 80.0–100.0)
Monocytes Absolute: 0.4 10*3/uL (ref 0.1–1.0)
Monocytes Relative: 5 %
Neutro Abs: 6.7 10*3/uL (ref 1.7–7.7)
Neutrophils Relative %: 87 %
Platelets: UNDETERMINED 10*3/uL (ref 150–400)
RBC: 3.62 MIL/uL — ABNORMAL LOW (ref 3.87–5.11)
RDW: 20.4 % — ABNORMAL HIGH (ref 11.5–15.5)
WBC: 7.7 10*3/uL (ref 4.0–10.5)
nRBC: 0 % (ref 0.0–0.2)

## 2023-02-22 LAB — BASIC METABOLIC PANEL
Anion gap: 14 (ref 5–15)
Anion gap: 14 (ref 5–15)
Anion gap: 11 (ref 5–15)
BUN: 8 mg/dL (ref 6–20)
BUN: 7 mg/dL (ref 6–20)
BUN: 8 mg/dL (ref 6–20)
CO2: 13 mmol/L — ABNORMAL LOW (ref 22–32)
CO2: 14 mmol/L — ABNORMAL LOW (ref 22–32)
CO2: 16 mmol/L — ABNORMAL LOW (ref 22–32)
Calcium: 8.1 mg/dL — ABNORMAL LOW (ref 8.9–10.3)
Calcium: 8.2 mg/dL — ABNORMAL LOW (ref 8.9–10.3)
Calcium: 8 mg/dL — ABNORMAL LOW (ref 8.9–10.3)
Chloride: 100 mmol/L (ref 98–111)
Chloride: 104 mmol/L (ref 98–111)
Chloride: 103 mmol/L (ref 98–111)
Creatinine, Ser: 0.65 mg/dL (ref 0.44–1.00)
Creatinine, Ser: 0.62 mg/dL (ref 0.44–1.00)
Creatinine, Ser: 0.82 mg/dL (ref 0.44–1.00)
GFR, Estimated: >60 mL/min (ref >60)
GFR, Estimated: >60 mL/min (ref >60)
GFR, Estimated: >60 mL/min (ref >60)
Glucose, Bld: 349 mg/dL — ABNORMAL HIGH (ref 70–99)
Glucose, Bld: 310 mg/dL — ABNORMAL HIGH (ref 70–99)
Glucose, Bld: 212 mg/dL — ABNORMAL HIGH (ref 70–99)
Potassium: 4.8 mmol/L (ref 3.5–5.1)
Potassium: 4.2 mmol/L (ref 3.5–5.1)
Potassium: 4.1 mmol/L (ref 3.5–5.1)
Sodium: 127 mmol/L — ABNORMAL LOW (ref 135–145)
Sodium: 132 mmol/L — ABNORMAL LOW (ref 135–145)
Sodium: 130 mmol/L — ABNORMAL LOW (ref 135–145)

## 2023-02-22 LAB — HEPATIC FUNCTION PANEL
ALT: 17 U/L (ref 0–44)
AST: 17 U/L (ref 15–41)
Albumin: 3 g/dL — ABNORMAL LOW (ref 3.5–5.0)
Alkaline Phosphatase: 96 U/L (ref 38–126)
Bilirubin, Direct: <0.1 mg/dL (ref 0.0–0.2)
Total Bilirubin: 0.3 mg/dL (ref 0.3–1.2)
Total Protein: 6.2 g/dL — ABNORMAL LOW (ref 6.5–8.1)

## 2023-02-22 LAB — GLUCOSE, CAPILLARY
Glucose-Capillary: 166 mg/dL — ABNORMAL HIGH (ref 70–99)
Glucose-Capillary: 85 mg/dL (ref 70–99)
Glucose-Capillary: 121 mg/dL — ABNORMAL HIGH (ref 70–99)
Glucose-Capillary: 146 mg/dL — ABNORMAL HIGH (ref 70–99)

## 2023-02-22 LAB — ETHANOL: Alcohol, Ethyl (B): 128 mg/dL — ABNORMAL HIGH (ref <10)

## 2023-02-22 LAB — TSH: TSH: 0.893 u[IU]/mL (ref 0.350–4.500)

## 2023-02-22 LAB — STREP PNEUMONIAE URINARY ANTIGEN: Strep Pneumo Urinary Antigen: NEGATIVE

## 2023-02-22 LAB — HEPARIN LEVEL (UNFRACTIONATED)
Heparin Unfractionated: 0.14 [IU]/mL — ABNORMAL LOW (ref 0.30–0.70)
Heparin Unfractionated: 0.13 [IU]/mL — ABNORMAL LOW (ref 0.30–0.70)

## 2023-02-22 LAB — MAGNESIUM: Magnesium: 2.1 mg/dL (ref 1.7–2.4)

## 2023-02-22 LAB — TROPONIN I (HIGH SENSITIVITY)
Troponin I (High Sensitivity): 15 ng/L (ref <18)
Troponin I (High Sensitivity): 17 ng/L (ref <18)

## 2023-02-22 LAB — MRSA NEXT GEN BY PCR, NASAL: MRSA by PCR Next Gen: NOT DETECTED

## 2023-02-22 LAB — SODIUM, URINE, RANDOM: Sodium, Ur: <10 mmol/L

## 2023-02-22 LAB — OSMOLALITY: Osmolality: 302 mosm/kg — ABNORMAL HIGH (ref 275–295)

## 2023-02-22 LAB — OSMOLALITY, URINE: Osmolality, Ur: 258 mosm/kg — ABNORMAL LOW (ref 300–900)

## 2023-02-22 LAB — PROCALCITONIN: Procalcitonin: 15.97 ng/mL

## 2023-02-22 MED ORDER — THIAMINE HCL 100 MG/ML IJ SOLN
100.0000 mg | Freq: Every day | INTRAMUSCULAR | Status: DC
  Administered 2023-02-22: 100 mg via INTRAVENOUS
  Filled 2023-02-22: qty 2

## 2023-02-22 MED ORDER — THIAMINE MONONITRATE 100 MG PO TABS: 100.0000 mg | ORAL_TABLET | Freq: Every day | ORAL | Status: DC

## 2023-02-22 MED ORDER — FAMOTIDINE 20 MG PO TABS
20.0000 mg | ORAL_TABLET | Freq: Every day | ORAL | Status: DC
  Administered 2023-02-22: 20 mg via ORAL
  Filled 2023-02-22: qty 1

## 2023-02-22 MED ORDER — LORAZEPAM 1 MG PO TABS
0.0000 mg | ORAL_TABLET | Freq: Four times a day (QID) | ORAL | Status: DC
  Filled 2023-02-22: qty 2

## 2023-02-22 MED ORDER — SODIUM CHLORIDE 0.9 % IV SOLN
1.0000 g | Freq: Once | INTRAVENOUS | Status: AC
  Administered 2023-02-22: 1 g via INTRAVENOUS
  Filled 2023-02-22: qty 10

## 2023-02-22 MED ORDER — DILTIAZEM LOAD VIA INFUSION
15.0000 mg | Freq: Once | INTRAVENOUS | Status: AC
  Administered 2023-02-22: 15 mg via INTRAVENOUS
  Filled 2023-02-22: qty 15

## 2023-02-22 MED ORDER — SODIUM CHLORIDE 0.9 % IV SOLN
1.0000 g | INTRAVENOUS | Status: DC
  Filled 2023-02-22: qty 10

## 2023-02-22 MED ORDER — LEVALBUTEROL HCL 0.63 MG/3ML IN NEBU
0.6300 mg | INHALATION_SOLUTION | Freq: Four times a day (QID) | RESPIRATORY_TRACT | Status: DC | PRN
  Administered 2023-02-22 (×2): 0.63 mg via RESPIRATORY_TRACT
  Filled 2023-02-22 (×2): qty 3

## 2023-02-22 MED ORDER — FOLIC ACID 1 MG PO TABS
1.0000 mg | ORAL_TABLET | Freq: Every day | ORAL | Status: DC
  Administered 2023-02-22: 1 mg via ORAL
  Filled 2023-02-22: qty 1

## 2023-02-22 MED ORDER — LORAZEPAM 2 MG/ML IJ SOLN
0.0000 mg | Freq: Four times a day (QID) | INTRAMUSCULAR | Status: DC
  Administered 2023-02-22: 1 mg via INTRAVENOUS
  Administered 2023-02-22 (×2): 2 mg via INTRAVENOUS
  Administered 2023-02-23: 3 mg via INTRAVENOUS
  Filled 2023-02-22: qty 1
  Filled 2023-02-22: qty 2
  Filled 2023-02-22 (×3): qty 1

## 2023-02-22 MED ORDER — SODIUM CHLORIDE 0.9 % IV SOLN: INTRAVENOUS | Status: DC

## 2023-02-22 MED ORDER — ACETAMINOPHEN 325 MG PO TABS
650.0000 mg | ORAL_TABLET | Freq: Four times a day (QID) | ORAL | Status: DC | PRN
  Administered 2023-02-22 – 2023-02-23 (×4): 650 mg via ORAL
  Filled 2023-02-22 (×4): qty 2

## 2023-02-22 MED ORDER — HEPARIN BOLUS VIA INFUSION
1500.0000 [IU] | Freq: Once | INTRAVENOUS | Status: AC
  Administered 2023-02-22: 1500 [IU] via INTRAVENOUS
  Filled 2023-02-22: qty 1500

## 2023-02-22 MED ORDER — METHOCARBAMOL 500 MG PO TABS
500.0000 mg | ORAL_TABLET | Freq: Four times a day (QID) | ORAL | Status: DC | PRN
  Administered 2023-02-22 – 2023-02-23 (×3): 500 mg via ORAL
  Filled 2023-02-22 (×3): qty 1

## 2023-02-22 MED ORDER — ALBUTEROL SULFATE HFA 108 (90 BASE) MCG/ACT IN AERS: 1.0000 | INHALATION_SPRAY | Freq: Four times a day (QID) | RESPIRATORY_TRACT | Status: DC | PRN

## 2023-02-22 MED ORDER — SODIUM CHLORIDE 0.9% FLUSH: 3.0000 mL | INTRAVENOUS | Status: DC | PRN

## 2023-02-22 MED ORDER — DOXYCYCLINE HYCLATE 100 MG PO TABS
100.0000 mg | ORAL_TABLET | Freq: Two times a day (BID) | ORAL | Status: DC
  Administered 2023-02-22 (×2): 100 mg via ORAL
  Filled 2023-02-22 (×2): qty 1

## 2023-02-22 MED ORDER — ONDANSETRON HCL 4 MG/2ML IJ SOLN: 4.0000 mg | Freq: Four times a day (QID) | INTRAMUSCULAR | Status: DC | PRN

## 2023-02-22 MED ORDER — ACETAMINOPHEN 650 MG RE SUPP: 650.0000 mg | Freq: Four times a day (QID) | RECTAL | Status: DC | PRN

## 2023-02-22 MED ORDER — SODIUM CHLORIDE 0.9 % IV SOLN: 250.0000 mL | INTRAVENOUS | Status: DC | PRN

## 2023-02-22 MED ORDER — FLUTICASONE FUROATE-VILANTEROL 200-25 MCG/ACT IN AEPB
1.0000 | INHALATION_SPRAY | Freq: Every day | RESPIRATORY_TRACT | Status: DC
  Administered 2023-02-22: 1 via RESPIRATORY_TRACT
  Filled 2023-02-22: qty 28

## 2023-02-22 MED ORDER — HEPARIN (PORCINE) 25000 UT/250ML-% IV SOLN
950.0000 [IU]/h | INTRAVENOUS | Status: DC
  Administered 2023-02-22: 650 [IU]/h via INTRAVENOUS
  Filled 2023-02-22: qty 250

## 2023-02-22 MED ORDER — TRAMADOL HCL 50 MG PO TABS
50.0000 mg | ORAL_TABLET | Freq: Four times a day (QID) | ORAL | Status: DC | PRN
  Administered 2023-02-22 – 2023-02-23 (×3): 50 mg via ORAL
  Filled 2023-02-22 (×3): qty 1

## 2023-02-22 MED ORDER — ONDANSETRON HCL 4 MG PO TABS: 4.0000 mg | ORAL_TABLET | Freq: Four times a day (QID) | ORAL | Status: DC | PRN

## 2023-02-22 MED ORDER — INSULIN ASPART 100 UNIT/ML IJ SOLN
0.0000 [IU] | Freq: Three times a day (TID) | INTRAMUSCULAR | Status: DC
  Administered 2023-02-22: 1 [IU] via SUBCUTANEOUS

## 2023-02-22 MED ORDER — LORAZEPAM 1 MG PO TABS: 0.0000 mg | ORAL_TABLET | Freq: Two times a day (BID) | ORAL | Status: DC

## 2023-02-22 MED ORDER — SENNOSIDES-DOCUSATE SODIUM 8.6-50 MG PO TABS: 1.0000 | ORAL_TABLET | Freq: Every evening | ORAL | Status: DC | PRN

## 2023-02-22 MED ORDER — SODIUM CHLORIDE 0.9% FLUSH
3.0000 mL | Freq: Two times a day (BID) | INTRAVENOUS | Status: DC
  Administered 2023-02-22: 3 mL via INTRAVENOUS

## 2023-02-22 MED ORDER — SODIUM CHLORIDE 0.9 % IV BOLUS
1000.0000 mL | Freq: Once | INTRAVENOUS | Status: AC
  Administered 2023-02-22: 1000 mL via INTRAVENOUS

## 2023-02-22 MED ORDER — HEPARIN BOLUS VIA INFUSION
2000.0000 [IU] | Freq: Once | INTRAVENOUS | Status: AC
  Administered 2023-02-22: 2000 [IU] via INTRAVENOUS
  Filled 2023-02-22: qty 2000

## 2023-02-22 MED ORDER — LORAZEPAM 2 MG/ML IJ SOLN: 0.0000 mg | Freq: Two times a day (BID) | INTRAMUSCULAR | Status: DC

## 2023-02-22 MED ORDER — NICOTINE 21 MG/24HR TD PT24
21.0000 mg | MEDICATED_PATCH | Freq: Every day | TRANSDERMAL | Status: DC
  Administered 2023-02-22: 21 mg via TRANSDERMAL
  Filled 2023-02-22: qty 1

## 2023-02-22 MED ORDER — INSULIN ASPART 100 UNIT/ML IJ SOLN: 0.0000 [IU] | Freq: Every day | INTRAMUSCULAR | Status: DC

## 2023-02-22 MED ORDER — DILTIAZEM HCL-DEXTROSE 125-5 MG/125ML-% IV SOLN (PREMIX)
5.0000 mg/h | INTRAVENOUS | Status: DC
  Administered 2023-02-22: 10 mg/h via INTRAVENOUS
  Administered 2023-02-22: 5 mg/h via INTRAVENOUS
  Filled 2023-02-22 (×2): qty 125

## 2023-02-22 NOTE — H&P (Addendum)
History and Physical    [name] ZOX:[id number] DOB: 1965/10/19 DOA: 02/22/2023  PCP: Fleet Contras, MD   Patient coming from: Home   Chief Complaint:  Chief Complaint  Patient presents with   Shortness of Breath    Patient to ED via EMS with complaint of sob. EMS reports patient was in a-fib at a rate of 170. Patient arrives to ED with NRB on O2 100% and a 20 gauge IV in left hand.    HPI:  [name] is a 57 y.o. female with medical history significant of schizophrenia, COPD, DM type II and chronic alcohol use presented to the emergency department third time today with palpitation.  Patient was presented earlier today found to have A-fib RVR.  She came again later tonight with complaining of palpitation and found to have A-fib RVR.  She is also complaining shortness of breath.  Reported drinking alcohol on regular basis.  Last drink around 5 PM today.  Patient reported also some shortness of breath that he started earlier in the evening.  She denies any cough, sputum production, fever and chills.   ED Course:  At ED initial presentation heart rate 164.  Blood pressure 190/70.  O2 sat 97% on 3 L and respiratory 27.  CMP showed low sodium 127, potassium 4.8, chloride 100, low bicarb 23, elevated blood glucose 349, BUN 8, creatinine 0.65, calcium 8.1 and anion gap 14. Magnesium 2.1. CBC showed WBC 6.9, RBC 3.41, hemoglobin 8.9, hematocrit 27, platelet count 274. Elevated blood alcohol level 128. Chest x-ray showed New patchy opacities within the perihilar left mid lung, left lower lung and right base. Findings may reflect areas of atelectasis or pneumonia.  EKG showing atrial fibrillation RVR.  In ED patient received Cardizem 15 mg IV once and started on Cardizem drip.  Also CIWA protocol started with Ativan as needed.  Hospitalist has been contacted for admission.   Review of Systems:  Review of Systems  Constitutional:  Negative for chills, fever, malaise/fatigue and  weight loss.  Respiratory:  Positive for shortness of breath. Negative for cough and sputum production.   Cardiovascular:  Positive for palpitations. Negative for chest pain, orthopnea, claudication, leg swelling and PND.  Gastrointestinal:  Negative for heartburn, nausea and vomiting.  Genitourinary:  Negative for dysuria and urgency.  Musculoskeletal:  Negative for myalgias.  Neurological:  Negative for dizziness and headaches.  Psychiatric/Behavioral:  The patient is not nervous/anxious.     Past Medical History:  Diagnosis Date   Abdominal pain    Accidental drug overdose April 2013   Anxiety    Atrial fibrillation (HCC) 09/29/11   converted spontaneously   Chronic back pain    Chronic knee pain    Chronic nausea    Chronic pain    COPD (chronic obstructive pulmonary disease) (HCC)    Depression    Diabetes mellitus    states her doctor took her off all DM meds in past month   Diabetic neuropathy (HCC)    Dyspnea    with exertion    GERD (gastroesophageal reflux disease)    Headache(784.0)    migraines    HTN (hypertension)    not on meds since in a year    Hyperlipidemia    Hypothyroidism    not on meds in a while    Mental disorder    Bipolar and schizophrenic   Nausea and vomiting 01/02/2023   Requires supplemental oxygen    as needed per patient  Schizophrenia (HCC)    Schizophrenia, acute (HCC) 11/13/2017   Tobacco abuse     Past Surgical History:  Procedure Laterality Date   ABDOMINAL HYSTERECTOMY     BLADDER SUSPENSION  03/04/2011   Procedure: [facility] PROCEDURE;  Surgeon: Jonna Coup MacDiarmid;  Location: [facility];  Service: Urology;  Laterality: N/A;   BOWEL RESECTION N/A 04/18/2022   Procedure: SMALL BOWEL RESECTION;  Surgeon: Harriette Bouillon, MD;  Location: [facility] OR;  Service: General;  Laterality: N/A;   CYSTOCELE REPAIR  03/04/2011   Procedure: ANTERIOR REPAIR (CYSTOCELE);  Surgeon: Martina Sinner;  Location: [facility];  Service: Urology;  Laterality: N/A;    CYSTOSCOPY  03/04/2011   Procedure: CYSTOSCOPY;  Surgeon: Jonna Coup MacDiarmid;  Location: [facility];  Service: Urology;  Laterality: N/A;   ESOPHAGOGASTRODUODENOSCOPY (EGD) WITH PROPOFOL N/A 05/12/2017   Procedure: ESOPHAGOGASTRODUODENOSCOPY (EGD) WITH PROPOFOL;  Surgeon: Ovidio Kin, MD;  Location: [facility];  Service: General;  Laterality: N/A;   GASTRIC ROUX-EN-Y N/A 03/25/2016   Procedure: LAPAROSCOPIC ROUX-EN-Y GASTRIC BYPASS WITH UPPER ENDOSCOPY;  Surgeon: Glenna Fellows, MD;  Location: [facility];  Service: General;  Laterality: N/A;   KNEE SURGERY     LAPAROSCOPIC ASSISTED VAGINAL HYSTERECTOMY  03/04/2011   Procedure: LAPAROSCOPIC ASSISTED VAGINAL HYSTERECTOMY;  Surgeon: Jeani Hawking, MD;  Location: [facility];  Service: Gynecology;  Laterality: N/A;   LAPAROTOMY N/A 04/18/2022   Procedure: EXPLORATORY LAPAROTOMY;  Surgeon: Harriette Bouillon, MD;  Location: [facility];  Service: General;  Laterality: N/A;   LAPAROTOMY N/A 04/24/2022   Procedure: BRING BACK EXPLORATORY LAPAROTOMY;  Surgeon: Violeta Gelinas, MD;  Location: [facility] OR;  Service: General;  Laterality: N/A;   TOTAL HIP ARTHROPLASTY Right 08/27/2022   Procedure: TOTAL HIP ARTHROPLASTY;  Surgeon: Joen Laura, MD;  Location: [facility];  Service: Orthopedics;  Laterality: Right;     reports that she has been smoking cigarettes. She has never used smokeless tobacco. She reports current alcohol use. She reports current drug use. Drugs: Cocaine, Marijuana, and "Crack" cocaine.  Allergies  Allergen Reactions   Iron Dextran Shortness Of Breath and Anxiety   Aspirin Nausea And Vomiting and Other (See Comments)    Ok to take tylenol or ibuprofen    Penicillins Other (See Comments)    Unknown. Childhood reaction     Family History  Problem Relation Age of Onset   Heart attack Father        15s   Diabetes Mother    Heart disease Mother    Hypertension Mother    Heart attack Sister        54   COPD Other    Breast cancer Neg Hx      Prior to Admission medications   Medication Sig Start Date End Date Taking? Authorizing Provider  albuterol (VENTOLIN HFA) 108 (90 Base) MCG/ACT inhaler Inhale 1-2 puffs into the lungs every 4 (four) hours as needed for wheezing or shortness of breath. 12/24/22   [provider]  albuterol (VENTOLIN HFA) 108 (90 Base) MCG/ACT inhaler Inhale 1-2 puffs into the lungs every 6 (six) hours as needed for wheezing or shortness of breath. 02/10/23   Rancour, Jeannett Senior, MD  budesonide-formoterol (SYMBICORT) 160-4.5 MCG/ACT inhaler Inhale 2 puffs into the lungs 2 (two) times daily. Patient not taking: Reported on 01/11/2023 12/01/22   Maretta Bees, MD  famotidine (PEPCID) 20 MG tablet Take 1 tablet (20 mg total) by mouth daily. 01/24/23   Elayne Snare K, DO  famotidine (PEPCID) 20 MG  tablet Take 1 tablet (20 mg total) by mouth 2 (two) times daily. 02/01/23   Glyn Ade, MD  hyoscyamine (LEVSIN/SL) 0.125 MG SL tablet Place 1 tablet (0.125 mg total) under the tongue every 4 (four) hours as needed (pain). Up to 1.25 mg daily 01/23/23   Arthor Captain, PA-C  metFORMIN (GLUCOPHAGE-XR) 750 MG 24 hr tablet Take 1 tablet (750 mg total) by mouth daily. 02/12/23 03/14/23  Jeannie Fend, PA-C  ondansetron (ZOFRAN) 4 MG tablet Take 1 tablet (4 mg total) by mouth every 6 (six) hours. 01/20/23   Prosperi, Christian H, PA-C  ondansetron (ZOFRAN-ODT) 4 MG disintegrating tablet Take 1 tablet (4 mg total) by mouth every 8 (eight) hours as needed for nausea or vomiting. 01/23/23   Arthor Captain, PA-C  OXYGEN Inhale 4-5 L into the lungs as needed (COPD).    [provider]  predniSONE (DELTASONE) 50 MG tablet 1 tablet PO daily 02/10/23   Rancour, Jeannett Senior, MD  sucralfate (CARAFATE) 1 g tablet Take 1 tablet (1 g total) by mouth 4 (four) times daily -  with meals and at bedtime. 02/11/23   Horton, Mayer Masker, MD  zonisamide (ZONEGRAN) 100 MG capsule Take 2 capsules (200 mg total) by mouth daily. 01/12/23    Lewie Chamber, MD     Physical Exam: Vitals:   02/22/23 0617 02/22/23 0624 02/22/23 0634 02/22/23 0645  BP: 120/64 120/64 115/70 104/83  Pulse:  (!) 143    Resp: (!) 22  19 (!) 31  Temp:      TempSrc:      SpO2:   96%   Weight:   49 kg   Height:   5\' 2"  (1.575 m)     Physical Exam HENT:     Head: Normocephalic.  Cardiovascular:     Rate and Rhythm: Regular rhythm. Tachycardia present.     Heart sounds: No murmur heard. Pulmonary:     Effort: Pulmonary effort is normal.     Breath sounds: Normal breath sounds. No decreased breath sounds, wheezing, rhonchi or rales.  Musculoskeletal:     Cervical back: Normal range of motion.     Right lower leg: No edema.     Left lower leg: No edema.  Skin:    General: Skin is warm.     Capillary Refill: Capillary refill takes less than 2 seconds.  Neurological:     Mental Status: She is alert and oriented to person, place, and time.  Psychiatric:        Mood and Affect: Mood normal.        Behavior: Behavior normal.      Labs on Admission: I have personally reviewed following labs and imaging studies  CBC: Recent Labs  Lab 02/21/23 1716 02/22/23 0515  WBC 6.9 6.9  HGB 10.1* 8.2*  HCT 33.6* 27.4*  MCV 78.5* 80.4  PLT 328 276   Basic Metabolic Panel: Recent Labs  Lab 02/21/23 1716 02/22/23 0515  NA 133* 127*  K 3.9 4.8  CL 104 100  CO2 18* 13*  GLUCOSE 181* 349*  BUN 7 8  CREATININE 0.46 0.65  CALCIUM 8.5* 8.1*  MG  --  2.1   GFR: Estimated Creatinine Clearance: 59.3 mL/min (by C-G formula based on SCr of 0.65 mg/dL). Liver Function Tests: No results for input(s): "AST", "ALT", "ALKPHOS", "BILITOT", "PROT", "ALBUMIN" in the last 168 hours. No results for input(s): "LIPASE", "AMYLASE" in the last 168 hours. No results for input(s): "AMMONIA" in the last 168  hours. Coagulation Profile: No results for input(s): "INR", "PROTIME" in the last 168 hours. Cardiac Enzymes: Recent Labs  Lab 02/21/23 1716   TROPONINIHS 8   BNP (last 3 results) Recent Labs    11/20/22 1039 11/28/22 0645 02/10/23 0444  BNP 84.2 63.9 79.5   HbA1C: No results for input(s): "HGBA1C" in the last 72 hours. CBG: No results for input(s): "GLUCAP" in the last 168 hours. Lipid Profile: No results for input(s): "CHOL", "HDL", "LDLCALC", "TRIG", "CHOLHDL", "LDLDIRECT" in the last 72 hours. Thyroid Function Tests: No results for input(s): "TSH", "T4TOTAL", "FREET4", "T3FREE", "THYROIDAB" in the last 72 hours. Anemia Panel: No results for input(s): "VITAMINB12", "FOLATE", "FERRITIN", "TIBC", "IRON", "RETICCTPCT" in the last 72 hours. Urine analysis:    Component Value Date/Time   COLORURINE STRAW (A) 02/12/2023 0350   APPEARANCEUR CLOUDY (A) 02/12/2023 0350   LABSPEC 1.013 02/12/2023 0350   PHURINE 5.0 02/12/2023 0350   GLUCOSEU 150 (A) 02/12/2023 0350   HGBUR NEGATIVE 02/12/2023 0350   BILIRUBINUR NEGATIVE 02/12/2023 0350   KETONESUR NEGATIVE 02/12/2023 0350   PROTEINUR NEGATIVE 02/12/2023 0350   UROBILINOGEN 0.2 09/04/2013 0426   NITRITE NEGATIVE 02/12/2023 0350   LEUKOCYTESUR NEGATIVE 02/12/2023 0350    Radiological Exams on Admission: I have personally reviewed images DG Chest Port 1 View  Result Date: 02/22/2023 CLINICAL DATA:  chest pain and palpitations. EXAM: PORTABLE CHEST 1 VIEW COMPARISON:  02/20/2021 FINDINGS: Heart size and mediastinal contours are unremarkable. No pleural fluid or interstitial edema. New patchy opacities within the perihilar left mid lung, left lower lung and right base identified. Visualized osseous structures are unremarkable. IMPRESSION: New patchy opacities within the perihilar left mid lung, left lower lung and right base. Findings may reflect areas of atelectasis or pneumonia. Electronically Signed   By: Signa Kell M.D.   On: 02/22/2023 06:48   DG Chest Portable 1 View  Result Date: 02/21/2023 CLINICAL DATA:  Shortness of breath.  Discharged earlier today. EXAM:  PORTABLE CHEST 1 VIEW COMPARISON:  02/15/2023 FINDINGS: Numerous leads and wires project over the chest. Patient rotated minimally right. Midline trachea. Normal heart size. No pleural effusion or pneumothorax. Mild lower lung predominant interstitial thickening is likely related to smoking/chronic bronchitis. IMPRESSION: No acute cardiopulmonary disease. Electronically Signed   By: Jeronimo Greaves M.D.   On: 02/21/2023 18:54    EKG: My personal interpretation of EKG shows: A-fib RVR heart rate 175.  No ST and T wave abnormality     Assessment/Plan: Principal Problem:   Paroxysmal atrial fibrillation with RVR (HCC) Active Problems:   COPD (chronic obstructive pulmonary disease) (HCC)   CAP (community acquired pneumonia)   Hyponatremia   Chronic alcohol use   Atrial fibrillation (HCC)    Assessment and Plan: Atrial fibrillation RVR - Patient coming with complaining of palpitation and shortness of breath. - Initial presentation to ED heart rate 114 and EKG showed A-fib RVR heart rate 175. Heart rate has been improved to 128 now. -BMP showed low sodium 127.  No other electrolyte abnormality.  Patient is also intoxicated due to alcohol use. -Atrial fibrillation RVR likely provoking by electrolyte derangement and alcohol use. - In the ED patient received Cardizem 15 mg IV bolus followed by currently on Cardizem drip. - Consulted pharmacy to start heparin drip. -Obtaining echocardiogram -Continue cardiac monitoring. -Consulted cardiology Dr. Allena Katz, agrees to continue the Cardizem drip and heparin drip as well.  Will evaluate patient soon.  Appreciate cardiology input.  Hyponatremia -Low sodium 127.  Patient baseline  sodium is around 133. Patient is heavy alcohol drinker.  She has poor solute and oral food intake. Poor natremia likely secondary to beer potomania. - Checking urine osmolarity, serum osmolarity urine sodium - Starting NS 100 cc/h for 24 hours. - Continue to check serum sodium  level every 6 hours. - Goal to improve sodium 8 to 10 mEq in next 24-hour until 5 AM 02/23/2023. -Encouraged patient to increase oral solute and protein intake. -Continue cardiac monitoring.  History of chronic alcohol use Acute alcohol intoxication -Patient reported heavy drinking.  Last drinking is around 5 PM 02/21/2023. -Blood alcohol level elevated 128. - Checking hepatic panel - Continue CIWA protocol and Ativan taper. - Continue multivitamin, folic acid and thiamine - Continue fall precaution - Counseled patient for alcohol cessation.   Non-anion gap metabolic acidosis -Low bicarb 13.  Normal anion gap 14.  Anion gap metabolic acidosis likely in the setting of alcohol use. - Continue to monitor bicarb level.   History of COPD -Patient is complaining of shortness of breath.  Currently O2 sat 96% on 3 L oxygen.  Patient denies any fever, chill and cough.  She does not have any wheezing on physical exam.  No concern for COPD exacerbation at this time.  Shortness of breath likely secondary to [employer] pneumonia - Continue Breo Ellipta 1 puff daily and Xopenex as needed for wheezing and shortness of breath.   CAP -Patient coming with complaint of shortness of breath.  She denies any fever and chills. - Chest x-ray showing New patchy opacities within the perihilar left mid lung, left lower lung and right base. Findings may reflect areas of atelectasis or pneumonia. -Checking blood culture, sputum culture, respiratory panel, urine Legionella and history Renastep coccus antigen - Starting ceftriaxone IV 1 g daily and doxycycline 100 mg twice daily   History of DM type II - Patient has elevated blood glucose 349. -Per chart review previous A1c 7.3 in 01/11/2023 - Continue carb modified diet, sliding scale insulin as needed. -During discharge patient need some prescription of metformin and need to follow-up with PCP for further management care.  Cigarette smoking - Patient  reported smoking more than half pack cigarettes in a day - Counseled patient about cessation of smoking. - Patient seems very restless and want to use some nicotine patch for the craving. - Starting nicotine patch 21 mg daily.   DVT prophylaxis:  IV heparin gtts Code Status:  Full Code Diet: Carb modified diet Family Communication:  None Disposition Plan: Pending clinical improvement.  Tentative discharge to home next 2 to 3 days Consults: Cardiology Admission status:   Inpatient, progressive unit  Severity of Illness: The appropriate patient status for this patient is INPATIENT. Inpatient status is judged to be reasonable and necessary in order to provide the required intensity of service to ensure the patient's safety. The patient's presenting symptoms, physical exam findings, and initial radiographic and laboratory data in the context of their chronic comorbidities is felt to place them at high risk for further clinical deterioration. Furthermore, it is not anticipated that the patient will be medically stable for discharge from the hospital within 2 midnights of admission.   * I certify that at the point of admission it is my clinical judgment that the patient will require inpatient hospital care spanning beyond 2 midnights from the point of admission due to high intensity of service, high risk for further deterioration and high frequency of surveillance required.Marland Kitchen    Tereasa Coop, MD [facility]  How to contact the [facility] Attending or Consulting provider 7A - 7P or covering provider during after hours 7P -7A, for this patient.  Check the care team in [facility] and look for a) attending/consulting [facility] provider listed and b) the [facility] team listed Log into [email or web address] and use [facility]'s universal password to access. If you do not have the password, please contact the hospital operator. Locate the [facility] provider you are looking for under [facility] and page to a number that you  can be directly reached. If you still have difficulty reaching the provider, please page the [facility] (Director on Call) for the Hospitalists listed on amion for assistance.  02/22/2023, 7:01 AM

## 2023-02-22 NOTE — Consult Note (Addendum)
Cardiology Consultation   Patient ID: [name] MRN: [id number]; DOB: December 03, 1965  Admit date: 02/22/2023 Date of Consult: 02/22/2023  PCP:  Fleet Contras, MD   [facility] Providers Cardiologist:  None  -- New this admission    Patient Profile:   [name] is a 57 y.o. female with a hx of schizophrenia, chronic/ongoing alcohol abuse, type 2 DM, COPD, anxiety, GERD, migraines, history of accidental drug overdose, HTN, HLD, tobacco use who is being seen 02/22/2023 for the evaluation of atrial fibrillation with RVR at the request of Dr. Janalyn Shy   History of Present Illness:   [name] is a 57 year old female with above medical history. Per chart review, patient was seen by cardiology in 2013 when she was admitted for accidental drug overdose. She had PAF while admitted, and converted spontaneously. She wore a 24 hour Holter monitor that showed PACs and PVCs, but no afib. She was initially on baby aspirin but it was later discontinued due to GI upset. She has not been seen by cardiology since 2013.  In the past month, patient has been seen in the ED on 15 separate occasions. Diagnoses include: shortness of breath, COPD exacerbation, chronic alcoholic gastritis, generalized abdominal pain, alcohol abuse, back pain. She was seen in the ED on 8/31 at 4 AM for trouble breathing.   Patient presented to the ED on 8/31 around 4:30 AM after she had trouble breathing. In the ED, patient reported that she did not want to be taken to the ED, and she ended up leaving AMA. She presented to the ED again on 8/31 around 5 PM complaining of difficulty breathing, back and abdominal pain. HR was in the 170s and she was in afib per EMS. BP 86/40. She was given an IV fluid bolus and was started on cardizem. Again, patient decided to leave AMA.   Patient presented to the ED on 9/1 around 5 AM complaining of shortness of breath. She was again found to be in atrial fibrillation. Initial vital signs  showed HR 164 BPM, BP 119/70, oxygen 100% on [place] 3 L/min. Labs showed Na 127, K 4.8, creatinine 0.65, WBC 6.9, hemoglobin 8.2, platelets 276. EKG showed atrial fibrillation with HR 175 BPM. CXR showed new patchy opacities within the perihilar left mid lung, left lower lung, and right base that may reflect areas of atelectasis or pneumonia.  Patient was started on diltiazem drip and IV heparin. Patient's blood alcohol was 128, and she was started on CIWA protocol with ativan and thiamine.   On interview, patient reports that she is having a "crazy" day. She feels very short of breath and is aware of a pounding feeling in her chest. Also complains of some chest pressure and lightheadedness. Denies fever, chills, body aches, cough. Called her daughter who is a Child psychotherapist at [employer]. Daughter reports that patient is likely dehydrated as she does not drink water and only drinks beer.   Past Medical History:  Diagnosis Date   Abdominal pain    Accidental drug overdose April 2013   Anxiety    Atrial fibrillation (HCC) 09/29/11   converted spontaneously   Chronic back pain    Chronic knee pain    Chronic nausea    Chronic pain    COPD (chronic obstructive pulmonary disease) (HCC)    Depression    Diabetes mellitus    states her doctor took her off all DM meds in past month   Diabetic neuropathy (HCC)  Dyspnea    with exertion    GERD (gastroesophageal reflux disease)    Headache(784.0)    migraines    HTN (hypertension)    not on meds since in a year    Hyperlipidemia    Hypothyroidism    not on meds in a while    Mental disorder    Bipolar and schizophrenic   Nausea and vomiting 01/02/2023   Requires supplemental oxygen    as needed per patient    Schizophrenia (HCC)    Schizophrenia, acute (HCC) 11/13/2017   Tobacco abuse     Past Surgical History:  Procedure Laterality Date   ABDOMINAL HYSTERECTOMY     BLADDER SUSPENSION  03/04/2011   Procedure: [facility] PROCEDURE;  Surgeon: Jonna Coup  MacDiarmid;  Location: [facility] OR[facility];  Service: Urology;  Laterality: N/A;   BOWEL RESECTION N/A 04/18/2022   Procedure: SMALL BOWEL RESECTION;  Surgeon: Harriette Bouillon, MD;  Location: [facility] OR;  Service: General;  Laterality: N/A;   CYSTOCELE REPAIR  03/04/2011   Procedure: ANTERIOR REPAIR (CYSTOCELE);  Surgeon: Martina Sinner;  Location: [facility];  Service: Urology;  Laterality: N/A;   CYSTOSCOPY  03/04/2011   Procedure: CYSTOSCOPY;  Surgeon: Jonna Coup MacDiarmid;  Location: [facility];  Service: Urology;  Laterality: N/A;   ESOPHAGOGASTRODUODENOSCOPY (EGD) WITH PROPOFOL N/A 05/12/2017   Procedure: ESOPHAGOGASTRODUODENOSCOPY (EGD) WITH PROPOFOL;  Surgeon: Ovidio Kin, MD;  Location: [facility];  Service: General;  Laterality: N/A;   GASTRIC ROUX-EN-Y N/A 03/25/2016   Procedure: LAPAROSCOPIC ROUX-EN-Y GASTRIC BYPASS WITH UPPER ENDOSCOPY;  Surgeon: Glenna Fellows, MD;  Location: [facility];  Service: General;  Laterality: N/A;   KNEE SURGERY     LAPAROSCOPIC ASSISTED VAGINAL HYSTERECTOMY  03/04/2011   Procedure: LAPAROSCOPIC ASSISTED VAGINAL HYSTERECTOMY;  Surgeon: Jeani Hawking, MD;  Location: [facility];  Service: Gynecology;  Laterality: N/A;   LAPAROTOMY N/A 04/18/2022   Procedure: EXPLORATORY LAPAROTOMY;  Surgeon: Harriette Bouillon, MD;  Location: [facility] OR;  Service: General;  Laterality: N/A;   LAPAROTOMY N/A 04/24/2022   Procedure: BRING BACK EXPLORATORY LAPAROTOMY;  Surgeon: Violeta Gelinas, MD;  Location: [facility] OR;  Service: General;  Laterality: N/A;   TOTAL HIP ARTHROPLASTY Right 08/27/2022   Procedure: TOTAL HIP ARTHROPLASTY;  Surgeon: Joen Laura, MD;  Location: [facility] OR;  Service: Orthopedics;  Laterality: Right;     Home Medications:  Prior to Admission medications   Medication Sig Start Date End Date Taking? Authorizing Provider  albuterol (VENTOLIN HFA) 108 (90 Base) MCG/ACT inhaler Inhale 1-2 puffs into the lungs every 6 (six) hours as needed for wheezing or shortness of breath. 02/10/23  Yes  Rancour, Jeannett Senior, MD  budesonide-formoterol (SYMBICORT) 160-4.5 MCG/ACT inhaler Inhale 2 puffs into the lungs 2 (two) times daily. 12/01/22  Yes Ghimire, Werner Lean, MD  famotidine (PEPCID) 20 MG tablet Take 1 tablet (20 mg total) by mouth daily. 01/24/23  Yes Theresia Lo, Victoria K, DO  metFORMIN (GLUCOPHAGE-XR) 750 MG 24 hr tablet Take 1 tablet (750 mg total) by mouth daily. 02/12/23 03/14/23 Yes Jeannie Fend, PA-C  ondansetron (ZOFRAN-ODT) 4 MG disintegrating tablet Take 1 tablet (4 mg total) by mouth every 8 (eight) hours as needed for nausea or vomiting. 01/23/23  Yes Harris, Abigail, PA-C  OXYGEN Inhale 4-5 L into the lungs as needed (COPD).   Yes [provider]  hyoscyamine (LEVSIN/SL) 0.125 MG SL tablet Place 1 tablet (0.125 mg total) under the tongue every 4 (four) hours as needed (pain). Up to 1.25 mg daily  Patient not taking: Reported on 02/22/2023 01/23/23   Arthor Captain, PA-C  ondansetron (ZOFRAN) 4 MG tablet Take 1 tablet (4 mg total) by mouth every 6 (six) hours. Patient not taking: Reported on 02/22/2023 01/20/23   Prosperi, Christian H, PA-C  sucralfate (CARAFATE) 1 g tablet Take 1 tablet (1 g total) by mouth 4 (four) times daily -  with meals and at bedtime. Patient not taking: Reported on 02/22/2023 02/11/23   Horton, Mayer Masker, MD  zonisamide (ZONEGRAN) 100 MG capsule Take 2 capsules (200 mg total) by mouth daily. Patient not taking: Reported on 02/22/2023 01/12/23   Lewie Chamber, MD    Inpatient Medications: Scheduled Meds:  doxycycline  100 mg Oral Q12H   famotidine  20 mg Oral Daily   fluticasone furoate-vilanterol  1 puff Inhalation Daily   heparin  2,000 Units Intravenous Once   insulin aspart  0-5 Units Subcutaneous QHS   insulin aspart  0-6 Units Subcutaneous TID WC   LORazepam  0-4 mg Intravenous Q6H   Or   LORazepam  0-4 mg Oral Q6H   [START ON 02/24/2023] LORazepam  0-4 mg Intravenous Q12H   Or   [START ON 02/24/2023] LORazepam  0-4 mg Oral Q12H   nicotine  21 mg  Transdermal Daily   sodium chloride flush  3 mL Intravenous Q12H   thiamine  100 mg Oral Daily   Or   thiamine  100 mg Intravenous Daily   Continuous Infusions:  sodium chloride     sodium chloride     [START ON 02/23/2023] cefTRIAXone (ROCEPHIN)  IV     cefTRIAXone (ROCEPHIN)  IV     diltiazem (CARDIZEM) infusion 10 mg/hr (02/22/23 0610)   heparin     PRN Meds: sodium chloride, acetaminophen **OR** acetaminophen, levalbuterol, ondansetron **OR** ondansetron (ZOFRAN) IV, senna-docusate, sodium chloride flush  Allergies:    Allergies  Allergen Reactions   Iron Dextran Shortness Of Breath and Anxiety   Aspirin Nausea And Vomiting and Other (See Comments)    Ok to take tylenol or ibuprofen    Penicillins Other (See Comments)    Unknown. Childhood reaction     Social History:   Social History   Socioeconomic History   Marital status: Legally Separated    Spouse name: Not on file   Number of children: Not on file   Years of education: Not on file   Highest education level: Not on file  Occupational History   Not on file  Tobacco Use   Smoking status: Every Day    Current packs/day: 1.00    Types: Cigarettes   Smokeless tobacco: Never  Vaping Use   Vaping status: Never Used  Substance and Sexual Activity   Alcohol use: Yes    Comment: 40 ounces daily   Drug use: Yes    Types: Cocaine, Marijuana, "Crack" cocaine    Comment: last use 01/19/23   Sexual activity: Yes    Partners: Male  Other Topics Concern   Not on file  Social History Narrative   Not on file   Social Determinants of Health   Financial Resource Strain: Not on file  Food Insecurity: No Food Insecurity (01/11/2023)   Hunger Vital Sign    Worried About Running Out of Food in the Last Year: Never true    Ran Out of Food in the Last Year: Never true  Transportation Needs: No Transportation Needs (01/11/2023)   PRAPARE - Administrator, Civil Service (Medical): No  Lack of Transportation  (Non-Medical): No  Physical Activity: Not on file  Stress: Not on file  Social Connections: Unknown (01/06/2023)   [facility] from [facility]   Social Network    Social Network: Not on file  Intimate Partner Violence: Not At Risk (01/11/2023)   Humiliation, Afraid, Rape, and Kick questionnaire    Fear of Current or Ex-Partner: No    Emotionally Abused: No    Physically Abused: No    Sexually Abused: No    Family History:    Family History  Problem Relation Age of Onset   Heart attack Father        51s   Diabetes Mother    Heart disease Mother    Hypertension Mother    Heart attack Sister        27   COPD Other    Breast cancer Neg Hx      ROS:  Please see the history of present illness.   All other ROS reviewed and negative.     Physical Exam/Data:   Vitals:   02/22/23 0700 02/22/23 0746 02/22/23 0908 02/22/23 0915  BP: 105/81 116/70    Pulse: (!) 49 (!) 112    Resp: 18 (!) 24    Temp:  97.8 F (36.6 C)    TempSrc:  Oral    SpO2: (!) 83% 95% 100% 99%  Weight:  54.5 kg    Height:  5\' 2"  (1.575 m)     No intake or output data in the 24 hours ending 02/22/23 0924    02/22/2023    7:46 AM 02/22/2023    6:34 AM 02/21/2023    5:06 PM  Last 3 Weights  Weight (lbs) 120 lb 3.2 oz 108 lb 0.4 oz 108 lb 0.4 oz  Weight (kg) 54.522 kg 49 kg 49 kg     Body mass index is 21.98 kg/m.  General:  Thin female, appears older than stated age. No acute distress  HEENT: normal Neck: no JVD Lungs:  Breathing unlabored  Ext: no edema in BLE Musculoskeletal:  No deformities Skin: warm and dry  Neuro:  CNs 2-12 intact, no focal abnormalities noted Psych:  Normal affect   Physical exam limited as patient was having an IV placed during my interview and I did not want to interrupt. Staff has had a difficult time placing Ivs this admission   EKG:  The EKG was personally reviewed and demonstrates:  Atrial fibrillation, HR 175 BPM  Telemetry:  Telemetry was personally reviewed and  demonstrates:  Atrial fibrillation, HR in the 110s-150s   Relevant CV Studies:   Laboratory Data:  High Sensitivity Troponin:   Recent Labs  Lab 02/10/23 0200 02/10/23 0444 02/13/23 0355 02/14/23 0616 02/21/23 1716  TROPONINIHS 7 6 5 6 8      Chemistry Recent Labs  Lab 02/21/23 1716 02/22/23 0515 02/22/23 0801  NA 133* 127* 132*  K 3.9 4.8 4.2  CL 104 100 104  CO2 18* 13* 14*  GLUCOSE 181* 349* 310*  BUN 7 8 7   CREATININE 0.46 0.65 0.62  CALCIUM 8.5* 8.1* 8.2*  MG  --  2.1  --   GFRNONAA >60 >60 >60  ANIONGAP 11 14 14     Recent Labs  Lab 02/22/23 0801  PROT 6.2*  ALBUMIN 3.0*  AST 17  ALT 17  ALKPHOS 96  BILITOT 0.3   Lipids No results for input(s): "CHOL", "TRIG", "HDL", "LABVLDL", "LDLCALC", "CHOLHDL" in the last 168 hours.  Hematology Recent  Labs  Lab 02/21/23 1716 02/22/23 0515 02/22/23 0801  WBC 6.9 6.9 7.7  RBC 4.28 3.41* 3.62*  HGB 10.1* 8.2* 8.7*  HCT 33.6* 27.4* 30.3*  MCV 78.5* 80.4 83.7  MCH 23.6* 24.0* 24.0*  MCHC 30.1 29.9* 28.7*  RDW 20.5* 20.2* 20.4*  PLT 328 276 PENDING   Thyroid No results for input(s): "TSH", "FREET4" in the last 168 hours.  BNPNo results for input(s): "BNP", "PROBNP" in the last 168 hours.  DDimer No results for input(s): "DDIMER" in the last 168 hours.   Radiology/Studies:  DG Chest Port 1 View  Result Date: 02/22/2023 CLINICAL DATA:  chest pain and palpitations. EXAM: PORTABLE CHEST 1 VIEW COMPARISON:  02/20/2021 FINDINGS: Heart size and mediastinal contours are unremarkable. No pleural fluid or interstitial edema. New patchy opacities within the perihilar left mid lung, left lower lung and right base identified. Visualized osseous structures are unremarkable. IMPRESSION: New patchy opacities within the perihilar left mid lung, left lower lung and right base. Findings may reflect areas of atelectasis or pneumonia. Electronically Signed   By: Signa Kell M.D.   On: 02/22/2023 06:48   DG Chest Portable 1  View  Result Date: 02/21/2023 CLINICAL DATA:  Shortness of breath.  Discharged earlier today. EXAM: PORTABLE CHEST 1 VIEW COMPARISON:  02/15/2023 FINDINGS: Numerous leads and wires project over the chest. Patient rotated minimally right. Midline trachea. Normal heart size. No pleural effusion or pneumothorax. Mild lower lung predominant interstitial thickening is likely related to smoking/chronic bronchitis. IMPRESSION: No acute cardiopulmonary disease. Electronically Signed   By: Jeronimo Greaves M.D.   On: 02/21/2023 18:54     Assessment and Plan:   Atrial Fibrillation with RVR  - Patient presented to the ED with shortness of breath, found to be in afib with RVR with HR in the 170s. Had one episode of afib in 2013 but has not been seen by cardiology at that time   - Afib is now occurring in the setting of alcohol use/possible withdrawal. Patient's last alcoholic beverage was yesterday around 5 PM. Blood alcohol 128 on arrival. Patient also being worked up for possible pneumonia (CXR showed new patchy opacities that may reflect atelectasis vs PNA) - Now on dilt drip and IV heparin - patient's peripheral IV's were not working, so she was off IV medications at the time of my evaluation. HR was in the 110s-140s  - Now has IV's placed and back on diltiazem. Follow HR on telemetry when on IV diltiazem - If BP does not allow for titration of diltiazem or if EF reduced on echo, can consider amiodarone drip  - Continue IV heparin while admitted - CHADS- VASc 3 (gender, HTN, diabetes)  - Note, with patient's ongoing alcohol use, she will likely not be a candidate for anticoagulation at discharge.  - Echocardiogram ordered and pending   Chest pain  - Patient complains of a pounding chest discomfort- also complains of some chest pressure  - Suspect that chest discomfort is due to afib with RVR. Ordered hsTn to rule out ACS  - Echocardiogram pending   Otherwise per primary  - Hyponatremia  - History of  chronic alcohol use, acute alcohol intoxication  - Non-anion gam metabolic acidosis  - COPD - CAP  - Type 2 DM    Risk Assessment/Risk Scores:      CHA2DS2-VASc Score = 3   This indicates a 3.2% annual risk of stroke. The patient's score is based upon: CHF History: 0 HTN History: 1 Diabetes  History: 1 Stroke History: 0 Vascular Disease History: 0 Age Score: 0 Gender Score: 1     For questions or updates, please contact [facility] Please consult [email or web address] for contact info under    Signed, Jonita Albee, PA-C  02/22/2023 9:24 AM  Patient seen and examined with KJ PA-C.  Agree as above, with the following exceptions and changes as noted below. 57 yo female with afib rvr in setting of alcohol abuse and repeated hospitalizations. Gen: NAD, CV: irregular and tachycardic, no murmurs, Lungs: bilateral rhonchi, UR sounds, Abd: soft, Extrem: Warm, well perfused, no edema, Neuro/Psych: alert and oriented x 3, normal mood and affect. All available labs, radiology testing, previous records reviewed. Agree as above with echo and continuing diltiazem infusion as well as heparin infusion though concern for longer term anticoagulation given alcohol use. Challenging situation. Workup still ongoing, trops pending for chest pain. Being treated for PNA as well.  Respnding well to IV diltiazem, continue.   Parke Poisson, MD 02/22/23 10:30 AM

## 2023-02-22 NOTE — Progress Notes (Signed)
ANTICOAGULATION CONSULT NOTE - Consult  Pharmacy Consult for heparin Indication: atrial fibrillation  Allergies  Allergen Reactions   Iron Dextran Shortness Of Breath and Anxiety   Aspirin Nausea And Vomiting and Other (See Comments)    Ok to take tylenol or ibuprofen    Penicillins Other (See Comments)    Unknown. Childhood reaction     Patient Measurements: Height: 5\' 2"  (157.5 cm) Weight: 54.5 kg (120 lb 3.2 oz) IBW/kg (Calculated) : 50.1 Heparin Dosing Weight: 49 kg  Vital Signs: Temp: 98.9 F (37.2 C) (09/01 1156) Temp Source: Oral (09/01 1156) BP: 118/80 (09/01 1156) Pulse Rate: 118 (09/01 1156)  Labs: Recent Labs    02/21/23 1716 02/22/23 0515 02/22/23 0801 02/22/23 1101 02/22/23 1342  HGB 10.1* 8.2* 8.7*  --   --   HCT 33.6* 27.4* 30.3*  --   --   PLT 328 276 PLATELET CLUMPS NOTED ON SMEAR, UNABLE TO ESTIMATE  --   --   HEPARINUNFRC  --   --   --   --  0.14*  CREATININE 0.46 0.[name] 0.62  --   --   TROPONINIHS 8  --   --  15 17    Estimated Creatinine Clearance: 60.6 mL/min (by C-G formula based on SCr of 0.62 mg/dL).   Medical History: Past Medical History:  Diagnosis Date   Abdominal pain    Accidental drug overdose April 2013   Anxiety    Atrial fibrillation (HCC) 09/29/11   converted spontaneously   Chronic back pain    Chronic knee pain    Chronic nausea    Chronic pain    COPD (chronic obstructive pulmonary disease) (HCC)    Depression    Diabetes mellitus    states her doctor took her off all DM meds in past month   Diabetic neuropathy (HCC)    Dyspnea    with exertion    GERD (gastroesophageal reflux disease)    Headache(784.0)    migraines    HTN (hypertension)    not on meds since in a year    Hyperlipidemia    Hypothyroidism    not on meds in a while    Mental disorder    Bipolar and schizophrenic   Nausea and vomiting 01/02/2023   Requires supplemental oxygen    as needed per patient    Schizophrenia (HCC)    Schizophrenia,  acute (HCC) 11/13/2017   Tobacco abuse    Assessment: [name] presented to ED with shortness of breath. PMH includes afib (no anticoagulant with fall risk, seizures, chronic ETOH use) and COPD.  Pharmacy consulted to dose heparin for afib.  Patient was in afib RVR on 8/31 and left AMA.  Heparin level 0.14 on 650 units/hr.  Hgb 8's, baseline appears to be 9-10s.  No bleeding or infusion issues noted.  Goal of Therapy:  Heparin level 0.3-0.7 units/ml Monitor platelets by anticoagulation protocol: Yes   Plan:  Heparin 1500 units x1 Increase heparin to 800 units/hr Check 6h heparin level Daily heparin level, CBC   Trixie Rude, PharmD Clinical Pharmacist 02/22/2023  3:15 PM  Please check AMION for all [facility] phone numbers After 10:00 PM, call Main Pharmacy [phone]

## 2023-02-22 NOTE — Progress Notes (Signed)
ANTICOAGULATION CONSULT NOTE - Consult  Pharmacy Consult for heparin Indication: atrial fibrillation  Allergies  Allergen Reactions   Iron Dextran Shortness Of Breath and Anxiety   Aspirin Nausea And Vomiting and Other (See Comments)    Ok to take tylenol or ibuprofen    Penicillins Other (See Comments)    Unknown. Childhood reaction     Patient Measurements: Height: 5\' 2"  (157.5 cm) Weight: 54.5 kg (120 lb 3.2 oz) IBW/kg (Calculated) : 50.1 Heparin Dosing Weight: 49 kg  Vital Signs: Temp: 98.2 F (36.8 C) (09/01 1915) Temp Source: Oral (09/01 1915) BP: 101/63 (09/01 1915) Pulse Rate: 110 (09/01 1915)  Labs: Recent Labs    02/21/23 1716 02/22/23 0515 02/22/23 0801 02/22/23 1101 02/22/23 1342 02/22/23 1755 02/22/23 2054  HGB 10.1* 8.2* 8.7*  --   --   --   --   HCT 33.6* 27.4* 30.3*  --   --   --   --   PLT 328 276 PLATELET CLUMPS NOTED ON SMEAR, UNABLE TO ESTIMATE  --   --   --   --   HEPARINUNFRC  --   --   --   --  0.14*  --  0.13*  CREATININE 0.46 0.65 0.62  --   --  0.82  --   TROPONINIHS 8  --   --  15 17  --   --     Estimated Creatinine Clearance: 59.1 mL/min (by C-G formula based on SCr of 0.82 mg/dL).   Medical History: Past Medical History:  Diagnosis Date   Abdominal pain    Accidental drug overdose April 2013   Anxiety    Atrial fibrillation (HCC) 09/29/11   converted spontaneously   Chronic back pain    Chronic knee pain    Chronic nausea    Chronic pain    COPD (chronic obstructive pulmonary disease) (HCC)    Depression    Diabetes mellitus    states her doctor took her off all DM meds in past month   Diabetic neuropathy (HCC)    Dyspnea    with exertion    GERD (gastroesophageal reflux disease)    Headache(784.0)    migraines    HTN (hypertension)    not on meds since in a year    Hyperlipidemia    Hypothyroidism    not on meds in a while    Mental disorder    Bipolar and schizophrenic   Nausea and vomiting 01/02/2023    Requires supplemental oxygen    as needed per patient    Schizophrenia (HCC)    Schizophrenia, acute (HCC) 11/13/2017   Tobacco abuse    Assessment: 84 yoF presented to ED with shortness of breath. PMH includes afib (no anticoagulant with fall risk, seizures, chronic ETOH use) and COPD.  Pharmacy consulted to dose heparin for afib.  Patient was in afib RVR on 8/31 and left AMA.  Heparin level 0.13 on 800 units/hr.  Hgb 8's, baseline appears to be 9-10s.    Goal of Therapy:  Heparin level 0.3-0.7 units/ml Monitor platelets by anticoagulation protocol: Yes   Plan:  Heparin 1500 units x1 Increase heparin to 950 units/hr Heparin level in 6 hrs  Harland German, PharmD Clinical Pharmacist **Pharmacist phone directory can now be found on [email or web address] ([email or web address]).  Listed under [facility].

## 2023-02-22 NOTE — ED Provider Notes (Signed)
[place] EMERGENCY DEPARTMENT AT [facility] Provider Note   CSN: [id number] Arrival date & time: 02/22/23  0450     History  Chief Complaint  Patient presents with   Shortness of Breath    Patient to ED via EMS with complaint of sob. EMS reports patient was in a-fib at a rate of 170. Patient arrives to ED with NRB on O2 100% and a 20 gauge IV in left hand.    [name] is a 57 y.o. female.  HPI     This is a 57 year old female well-known to our emergency department with a history of COPD, atrial fibrillation, multiple ED visits who presents with shortness of breath.  Patient reports that she woke up acutely short of breath.  She is post to wear 3 L of oxygen at home.  She was seen and evaluated twice yesterday and left AGAINST MEDICAL ADVICE.  On her final evaluation at [facility] she was in atrial fibrillation with RVR.  She appears to be in the same.  Per EMS she was tachycardic.  Rates in the 170s.  No interventions en route.  Denies recent fevers or cough.  Patient with known history of alcohol abuse.  Reports drinking 2 40s.  Home Medications Prior to Admission medications   Medication Sig Start Date End Date Taking? Authorizing Provider  albuterol (VENTOLIN HFA) 108 (90 Base) MCG/ACT inhaler Inhale 1-2 puffs into the lungs every 6 (six) hours as needed for wheezing or shortness of breath. 02/10/23  Yes Rancour, Jeannett Senior, MD  budesonide-formoterol (SYMBICORT) 160-4.5 MCG/ACT inhaler Inhale 2 puffs into the lungs 2 (two) times daily. 12/01/22  Yes Ghimire, Werner Lean, MD  famotidine (PEPCID) 20 MG tablet Take 1 tablet (20 mg total) by mouth daily. 01/24/23   Elayne Snare K, DO  famotidine (PEPCID) 20 MG tablet Take 1 tablet (20 mg total) by mouth 2 (two) times daily. 02/01/23   Glyn Ade, MD  hyoscyamine (LEVSIN/SL) 0.125 MG SL tablet Place 1 tablet (0.125 mg total) under the tongue every 4 (four) hours as needed (pain). Up to 1.25 mg daily 01/23/23   Arthor Captain, PA-C  metFORMIN (GLUCOPHAGE-XR) 750 MG 24 hr tablet Take 1 tablet (750 mg total) by mouth daily. 02/12/23 03/14/23  Jeannie Fend, PA-C  ondansetron (ZOFRAN) 4 MG tablet Take 1 tablet (4 mg total) by mouth every 6 (six) hours. 01/20/23   Prosperi, Christian H, PA-C  ondansetron (ZOFRAN-ODT) 4 MG disintegrating tablet Take 1 tablet (4 mg total) by mouth every 8 (eight) hours as needed for nausea or vomiting. 01/23/23   Arthor Captain, PA-C  OXYGEN Inhale 4-5 L into the lungs as needed (COPD).    [provider]  predniSONE (DELTASONE) 50 MG tablet 1 tablet PO daily 02/10/23   Rancour, Jeannett Senior, MD  sucralfate (CARAFATE) 1 g tablet Take 1 tablet (1 g total) by mouth 4 (four) times daily -  with meals and at bedtime. 02/11/23   Corie Vavra, Mayer Masker, MD  zonisamide (ZONEGRAN) 100 MG capsule Take 2 capsules (200 mg total) by mouth daily. 01/12/23   Lewie Chamber, MD      Allergies    Iron dextran, Aspirin, and Penicillins    Review of Systems   Review of Systems  Constitutional:  Negative for fever.  Respiratory:  Positive for shortness of breath.   Cardiovascular:  Positive for chest pain and palpitations.  Gastrointestinal:  Negative for abdominal pain, diarrhea, nausea and vomiting.  All other systems  reviewed and are negative.   Physical Exam Updated Vital Signs BP 120/64   Pulse 92   Temp 97.9 F (36.6 C) (Oral)   Resp (!) 22   LMP 01/08/2011   SpO2 (!) 86%  Physical Exam Vitals and nursing note reviewed.  Constitutional:      Appearance: She is ill-appearing. She is not toxic-appearing.  HENT:     Head: Normocephalic and atraumatic.  Eyes:     Pupils: Pupils are equal, round, and reactive to light.  Cardiovascular:     Rate and Rhythm: Tachycardia present. Rhythm irregular.     Heart sounds: Normal heart sounds.  Pulmonary:     Effort: Pulmonary effort is normal. Tachypnea present. No respiratory distress.     Breath sounds: No wheezing.     Comments:  Occasional expiratory wheeze, nasal cannula in place Abdominal:     General: Bowel sounds are normal.     Palpations: Abdomen is soft.  Musculoskeletal:     Cervical back: Neck supple.     Right lower leg: No edema.     Left lower leg: No edema.  Skin:    General: Skin is warm and dry.  Neurological:     Mental Status: She is alert and oriented to person, place, and time.  Psychiatric:     Comments: You are mentally agitated     ED Results / Procedures / Treatments   Labs (all labs ordered are listed, but only abnormal results are displayed) Labs Reviewed  BASIC METABOLIC PANEL - Abnormal; Notable for the following components:      Result Value   Sodium 127 (*)    CO2 13 (*)    Glucose, Bld 349 (*)    Calcium 8.1 (*)    All other components within normal limits  CBC - Abnormal; Notable for the following components:   RBC 3.41 (*)    Hemoglobin 8.2 (*)    HCT 27.4 (*)    MCH 24.0 (*)    MCHC 29.9 (*)    RDW 20.2 (*)    All other components within normal limits  ETHANOL - Abnormal; Notable for the following components:   Alcohol, Ethyl (B) 128 (*)    All other components within normal limits  MAGNESIUM    EKG EKG Interpretation Date/Time:  Sunday February 22 2023 04:59:34 EDT Ventricular Rate:  175 PR Interval:  76 QRS Duration:  69 QT Interval:  268 QTC Calculation: 458 R Axis:   49  Text Interpretation: Atrial fibrillation with RVR Nonspecific T abnrm, anterolateral leads Confirmed by Ross Marcus ([id number]) on 02/22/2023 5:54:00 AM  Radiology DG Chest Portable 1 View  Result Date: 02/21/2023 CLINICAL DATA:  Shortness of breath.  Discharged earlier today. EXAM: PORTABLE CHEST 1 VIEW COMPARISON:  02/15/2023 FINDINGS: Numerous leads and wires project over the chest. Patient rotated minimally right. Midline trachea. Normal heart size. No pleural effusion or pneumothorax. Mild lower lung predominant interstitial thickening is likely related to smoking/chronic  bronchitis. IMPRESSION: No acute cardiopulmonary disease. Electronically Signed   By: Jeronimo Greaves M.D.   On: 02/21/2023 18:54    Procedures .Critical Care  Performed by: Shon Baton, MD Authorized by: Shon Baton, MD   Critical care provider statement:    Critical care time (minutes):  45   Critical care was necessary to treat or prevent imminent or life-threatening deterioration of the following conditions: Atrial fibrillation, metabolic derangement, alcohol abuse.   Critical care was time spent personally by me  on the following activities:  Development of treatment plan with patient or surrogate, discussions with consultants, evaluation of patient's response to treatment, examination of patient, ordering and review of laboratory studies, ordering and review of radiographic studies, ordering and performing treatments and interventions, pulse oximetry, re-evaluation of patient's condition and review of old charts     Medications Ordered in ED Medications  diltiazem (CARDIZEM) 1 mg/mL load via infusion 15 mg (15 mg Intravenous Bolus from Bag 02/22/23 0533)    And  diltiazem (CARDIZEM) 125 mg in dextrose 5% 125 mL (1 mg/mL) infusion (10 mg/hr Intravenous Rate/Dose Change 02/22/23 0610)  LORazepam (ATIVAN) injection 0-4 mg (has no administration in time range)    Or  LORazepam (ATIVAN) tablet 0-4 mg (has no administration in time range)  LORazepam (ATIVAN) injection 0-4 mg (has no administration in time range)    Or  LORazepam (ATIVAN) tablet 0-4 mg (has no administration in time range)  thiamine (VITAMIN B1) tablet 100 mg (has no administration in time range)    Or  thiamine (VITAMIN B1) injection 100 mg (has no administration in time range)  sodium chloride 0.9 % bolus 1,000 mL (1,000 mLs Intravenous New Bag/Given 02/22/23 0531)    ED Course/ Medical Decision Making/ A&P                                 Medical Decision Making Amount and/or Complexity of Data  Reviewed Labs: ordered. Radiology: ordered.  Risk OTC drugs. Prescription drug management. Decision regarding hospitalization.   This patient presents to the ED for concern of shortness of breath, this involves an extensive number of treatment options, and is a complaint that carries with it a high risk of complications and morbidity.  I considered the following differential and admission for this acute, potentially life threatening condition.  The differential diagnosis includes COPD, arrhythmia, ACS, pneumothorax, withdrawal, intoxication  MDM:    This is a 57 year old female well-known to our emergency department who presents with reported shortness of breath.  Was seen and evaluated twice yesterday.  Was in atrial fibrillation with RVR yesterday and left AGAINST MEDICAL ADVICE.  She is in atrial fibrillation upon arrival here.  She is not on any blood thinners and generally has a history of noncompliance.  Patient was placed on a diltiazem drip.  She was also placed on CIWA protocol as she has high risk for withdrawal.  Labs obtained and reviewed.  Sodium 127.  She was given a liter of fluids.  She is hyperglycemic without an anion gap.  Magnesium normal.  No wheezing on exam to suggest COPD exacerbation.  Chest x-ray reviewed by myself and without obvious infectious etiology.  Patient agrees to admission and workup.  I stressed to her that we cannot help her unless she concedes to treatment.  (Labs, imaging, consults)  Labs: I Ordered, and personally interpreted labs.  The pertinent results include: CBC, CMP, magnesium  Imaging Studies ordered: I ordered imaging studies including chest x-ray I independently visualized and interpreted imaging. I agree with the radiologist interpretation  Additional history obtained from chart review.  External records from outside source obtained and reviewed including prior evaluations  Cardiac Monitoring: The patient was maintained on a cardiac  monitor.  If on the cardiac monitor, I personally viewed and interpreted the cardiac monitored which showed an underlying rhythm of: Atrial fibrillation  Reevaluation: After the interventions noted above, I reevaluated the patient and  found that they have :stayed the same  Social Determinants of Health:  lives independently, alcohol abuse history  Disposition: Admit, spoke with Dr. Janalyn Shy  Co morbidities that complicate the patient evaluation  Past Medical History:  Diagnosis Date   Abdominal pain    Accidental drug overdose April 2013   Anxiety    Atrial fibrillation (HCC) 09/29/11   converted spontaneously   Chronic back pain    Chronic knee pain    Chronic nausea    Chronic pain    COPD (chronic obstructive pulmonary disease) (HCC)    Depression    Diabetes mellitus    states her doctor took her off all DM meds in past month   Diabetic neuropathy (HCC)    Dyspnea    with exertion    GERD (gastroesophageal reflux disease)    Headache(784.0)    migraines    HTN (hypertension)    not on meds since in a year    Hyperlipidemia    Hypothyroidism    not on meds in a while    Mental disorder    Bipolar and schizophrenic   Nausea and vomiting 01/02/2023   Requires supplemental oxygen    as needed per patient    Schizophrenia (HCC)    Schizophrenia, acute (HCC) 11/13/2017   Tobacco abuse      Medicines Meds ordered this encounter  Medications   sodium chloride 0.9 % bolus 1,000 mL   AND Linked Order Group    diltiazem (CARDIZEM) 1 mg/mL load via infusion 15 mg    diltiazem (CARDIZEM) 125 mg in dextrose 5% 125 mL (1 mg/mL) infusion   OR Linked Order Group    LORazepam (ATIVAN) injection 0-4 mg     Order Specific Question:   CIWA-AR < 5 =     Answer:   0 mg     Order Specific Question:   CIWA-AR 5 -10 =     Answer:   1 mg     Order Specific Question:   CIWA-AR 11 -15 =     Answer:   2 mg     Order Specific Question:   CIWA-AR 16 -20 =     Answer:   3 mg     Order  Specific Question:   CIWA-AR 16 -20 =     Answer:   Recheck CIWA-AR in 1 hour; if > 20 notify MD     Order Specific Question:   CIWA-AR > 20 =     Answer:   4 mg     Order Specific Question:   CIWA-AR > 20 =     Answer:   Notify MD    LORazepam (ATIVAN) tablet 0-4 mg     Order Specific Question:   CIWA-AR < 5 =     Answer:   0 mg     Order Specific Question:   CIWA-AR 5 -10 =     Answer:   1 mg     Order Specific Question:   CIWA-AR 11 -15 =     Answer:   2 mg     Order Specific Question:   CIWA-AR 16 -20 =     Answer:   3 mg     Order Specific Question:   CIWA-AR 16 -20 =     Answer:   Recheck CIWA-AR in 1 hour; if > 20 notify MD     Order Specific Question:   CIWA-AR > 20 =  Answer:   4 mg     Order Specific Question:   CIWA-AR > 20 =     Answer:   Notify MD   OR Linked Order Group    LORazepam (ATIVAN) injection 0-4 mg     Order Specific Question:   CIWA-AR < 5 =     Answer:   0 mg     Order Specific Question:   CIWA-AR 5 -10 =     Answer:   1 mg     Order Specific Question:   CIWA-AR 11 -15 =     Answer:   2 mg     Order Specific Question:   CIWA-AR 16 -20 =     Answer:   3 mg     Order Specific Question:   CIWA-AR 16 -20 =     Answer:   Recheck CIWA-AR in 1 hour; if > 20 notify MD     Order Specific Question:   CIWA-AR > 20 =     Answer:   4 mg     Order Specific Question:   CIWA-AR > 20 =     Answer:   Notify MD    LORazepam (ATIVAN) tablet 0-4 mg     Order Specific Question:   CIWA-AR < 5 =     Answer:   0 mg     Order Specific Question:   CIWA-AR 5 -10 =     Answer:   1 mg     Order Specific Question:   CIWA-AR 11 -15 =     Answer:   2 mg     Order Specific Question:   CIWA-AR 16 -20 =     Answer:   3 mg     Order Specific Question:   CIWA-AR 16 -20 =     Answer:   Recheck CIWA-AR in 1 hour; if > 20 notify MD     Order Specific Question:   CIWA-AR > 20 =     Answer:   4 mg     Order Specific Question:   CIWA-AR > 20 =     Answer:   Notify MD   OR Linked  Order Group    thiamine (VITAMIN B1) tablet 100 mg    thiamine (VITAMIN B1) injection 100 mg    I have reviewed the patients home medicines and have made adjustments as needed  Problem List / ED Course: Problem List Items Addressed This Visit       Cardiovascular and Mediastinum   Atrial fibrillation with RVR (HCC) - Primary   Relevant Medications   diltiazem (CARDIZEM) 125 mg in dextrose 5% 125 mL (1 mg/mL) infusion     Other   Hyponatremia   Other Visit Diagnoses     Alcohol abuse                       Final Clinical Impression(s) / ED Diagnoses Final diagnoses:  Atrial fibrillation with RVR (HCC)  Alcohol abuse  Hyponatremia    Rx / DC Orders ED Discharge Orders          Ordered    Amb referral to AFIB Clinic        02/22/23 0502              Shon Baton, MD 02/22/23 [phone]

## 2023-02-22 NOTE — Progress Notes (Signed)
At bedside to place PIV per consult.  Pt yelling and cursing, states wants to leave the hospital.   RN at bedside states no need to start PIV.

## 2023-02-22 NOTE — ED Notes (Signed)
ED TO INPATIENT HANDOFF REPORT  ED Nurse Name and Phone #:  [phone]  S Name/Age/Gender [name] 57 y.o. female Room/Bed: 033C/033C  Code Status   Code Status: Full Code  Home/SNF/Other Home Patient oriented to: self, place, time, and situation Is this baseline? Yes   Triage Complete: Triage complete  Chief Complaint Atrial fibrillation [facility]) [I48.91]  Triage Note No notes on file   Allergies Allergies  Allergen Reactions   Iron Dextran Shortness Of Breath and Anxiety   Aspirin Nausea And Vomiting and Other (See Comments)    Ok to take tylenol or ibuprofen    Penicillins Other (See Comments)    Unknown. Childhood reaction     Level of Care/Admitting Diagnosis ED Disposition     ED Disposition  Admit   Condition  --   Comment  Hospital Area: [facility] [[id number]]  Level of Care: Progressive [102]  Admit to Progressive based on following criteria: Other see comments  Comments: Atrial fibrillation RVR currently on Cardizem drip  May admit patient to [facility] or [facility] if equivalent level of care is available:: No  Covid Evaluation: Asymptomatic - no recent exposure (last 10 days) testing not required  Diagnosis: Atrial fibrillation (HCC) [427.31.ICD-9-CM]  Admitting Physician: Tereasa Coop [[id number]]  Attending Physician: Tereasa Coop [[id number]]  Certification:: I certify this patient will need inpatient services for at least 2 midnights  Expected Medical Readiness: 02/26/2023          B Medical/Surgery History Past Medical History:  Diagnosis Date   Abdominal pain    Accidental drug overdose April 2013   Anxiety    Atrial fibrillation (HCC) 09/29/11   converted spontaneously   Chronic back pain    Chronic knee pain    Chronic nausea    Chronic pain    COPD (chronic obstructive pulmonary disease) (HCC)    Depression    Diabetes mellitus    states her doctor took her off all DM meds in past month   Diabetic neuropathy  (HCC)    Dyspnea    with exertion    GERD (gastroesophageal reflux disease)    Headache(784.0)    migraines    HTN (hypertension)    not on meds since in a year    Hyperlipidemia    Hypothyroidism    not on meds in a while    Mental disorder    Bipolar and schizophrenic   Nausea and vomiting 01/02/2023   Requires supplemental oxygen    as needed per patient    Schizophrenia (HCC)    Schizophrenia, acute (HCC) 11/13/2017   Tobacco abuse    Past Surgical History:  Procedure Laterality Date   ABDOMINAL HYSTERECTOMY     BLADDER SUSPENSION  03/04/2011   Procedure: [facility] PROCEDURE;  Surgeon: Jonna Coup MacDiarmid;  Location: [facility] ORS;  Service: Urology;  Laterality: N/A;   BOWEL RESECTION N/A 04/18/2022   Procedure: SMALL BOWEL RESECTION;  Surgeon: Harriette Bouillon, MD;  Location: [facility] OR;  Service: General;  Laterality: N/A;   CYSTOCELE REPAIR  03/04/2011   Procedure: ANTERIOR REPAIR (CYSTOCELE);  Surgeon: Martina Sinner;  Location: [facility];  Service: Urology;  Laterality: N/A;   CYSTOSCOPY  03/04/2011   Procedure: CYSTOSCOPY;  Surgeon: Jonna Coup MacDiarmid;  Location: [facility];  Service: Urology;  Laterality: N/A;   ESOPHAGOGASTRODUODENOSCOPY (EGD) WITH PROPOFOL N/A 05/12/2017   Procedure: ESOPHAGOGASTRODUODENOSCOPY (EGD) WITH PROPOFOL;  Surgeon: Ovidio Kin, MD;  Location: [facility] ENDOSCOPY;  Service: General;  Laterality: N/A;   GASTRIC ROUX-EN-Y N/A 03/25/2016   Procedure: LAPAROSCOPIC ROUX-EN-Y GASTRIC BYPASS WITH UPPER ENDOSCOPY;  Surgeon: Glenna Fellows, MD;  Location: [facility];  Service: General;  Laterality: N/A;   KNEE SURGERY     LAPAROSCOPIC ASSISTED VAGINAL HYSTERECTOMY  03/04/2011   Procedure: LAPAROSCOPIC ASSISTED VAGINAL HYSTERECTOMY;  Surgeon: Jeani Hawking, MD;  Location: [facility];  Service: Gynecology;  Laterality: N/A;   LAPAROTOMY N/A 04/18/2022   Procedure: EXPLORATORY LAPAROTOMY;  Surgeon: Harriette Bouillon, MD;  Location: [facility] OR;  Service: General;  Laterality: N/A;    LAPAROTOMY N/A 04/24/2022   Procedure: BRING BACK EXPLORATORY LAPAROTOMY;  Surgeon: Violeta Gelinas, MD;  Location: [facility] OR;  Service: General;  Laterality: N/A;   TOTAL HIP ARTHROPLASTY Right 08/27/2022   Procedure: TOTAL HIP ARTHROPLASTY;  Surgeon: Joen Laura, MD;  Location: [facility] OR;  Service: Orthopedics;  Laterality: Right;     A IV Location/Drains/Wounds Patient Lines/Drains/Airways Status     Active Line/Drains/Airways     Name Placement date Placement time Site Days   Peripheral IV 02/22/23 20 G Anterior;Left Hand 02/22/23  0506  Hand  less than 1   Wound / Incision (Open or Dehisced) 12/11/22  Thigh Anterior;Left bruise on left hip, 12/11/22  0400  Thigh  73   Wound / Incision (Open or Dehisced) 12/11/22  Abdomen Lower;Medial 12/11/22  0400  Abdomen  73   Wound / Incision (Open or Dehisced) 12/11/22  Vertebral column Upper 12/11/22  0400  Vertebral column  73            Intake/Output Last 24 hours No intake or output data in the 24 hours ending 02/22/23 0655  Labs/Imaging Results for orders placed or performed during the hospital encounter of 02/22/23 (from the past 48 hour(s))  Basic metabolic panel     Status: Abnormal   Collection Time: 02/22/23  5:15 AM  Result Value Ref Range   Sodium 127 (L) 135 - 145 mmol/L   Potassium 4.8 3.5 - 5.1 mmol/L   Chloride 100 98 - 111 mmol/L   CO2 13 (L) 22 - 32 mmol/L   Glucose, Bld 349 (H) 70 - 99 mg/dL    Comment: Glucose reference range applies only to samples taken after fasting for at least 8 hours.   BUN 8 6 - 20 mg/dL   Creatinine, Ser 5.78 0.44 - 1.00 mg/dL   Calcium 8.1 (L) 8.9 - 10.3 mg/dL   GFR, Estimated >46 >96 mL/min    Comment: (NOTE) Calculated using the CKD-EPI Creatinine Equation (2021)    Anion gap 14 5 - 15    Comment: Performed at [facility], [address], [place], [place] [address]  Magnesium     Status: None   Collection Time: 02/22/23  5:15 AM  Result Value Ref Range   Magnesium 2.1 1.7  - 2.4 mg/dL    Comment: Performed at [facility], [address] [place], [place] [address]  CBC     Status: Abnormal   Collection Time: 02/22/23  5:15 AM  Result Value Ref Range   WBC 6.9 4.0 - 10.5 K/uL   RBC 3.41 (L) 3.87 - 5.11 MIL/uL   Hemoglobin 8.2 (L) 12.0 - 15.0 g/dL   HCT 40.1 (L) 02.7 - 25.3 %   MCV 80.4 80.0 - 100.0 fL   MCH 24.0 (L) 26.0 - 34.0 pg   MCHC 29.9 (L) 30.0 - 36.0 g/dL   RDW 66.4 (H) 40.3 - 47.4 %  Platelets 276 150 - 400 K/uL   nRBC 0.0 0.0 - 0.2 %    Comment: Performed at [facility], [address], [place], [place] [address]  Ethanol     Status: Abnormal   Collection Time: 02/22/23  5:15 AM  Result Value Ref Range   Alcohol, Ethyl (B) 128 (H) <10 mg/dL    Comment: (NOTE) Lowest detectable limit for serum alcohol is 10 mg/dL.  For medical purposes only. Performed at [facility], [address] [place], [place] [address]    DG Chest Port 1 View  Result Date: 02/22/2023 CLINICAL DATA:  chest pain and palpitations. EXAM: PORTABLE CHEST 1 VIEW COMPARISON:  02/20/2021 FINDINGS: Heart size and mediastinal contours are unremarkable. No pleural fluid or interstitial edema. New patchy opacities within the perihilar left mid lung, left lower lung and right base identified. Visualized osseous structures are unremarkable. IMPRESSION: New patchy opacities within the perihilar left mid lung, left lower lung and right base. Findings may reflect areas of atelectasis or pneumonia. Electronically Signed   By: Signa Kell M.D.   On: 02/22/2023 06:48   DG Chest Portable 1 View  Result Date: 02/21/2023 CLINICAL DATA:  Shortness of breath.  Discharged earlier today. EXAM: PORTABLE CHEST 1 VIEW COMPARISON:  02/15/2023 FINDINGS: Numerous leads and wires project over the chest. Patient rotated minimally right. Midline trachea. Normal heart size. No pleural effusion or pneumothorax. Mild lower lung predominant interstitial thickening is likely related to  smoking/chronic bronchitis. IMPRESSION: No acute cardiopulmonary disease. Electronically Signed   By: Jeronimo Greaves M.D.   On: 02/21/2023 18:54    Pending Labs Unresulted Labs (From admission, onward)     Start     Ordered   02/22/23 0656  Strep pneumoniae urinary antigen  Once,   R        02/22/23 0655   02/22/23 0655  Respiratory (~20 pathogens) panel by PCR  (Respiratory panel by PCR (~20 pathogens, ~24 hr TAT)  w precautions)  ONCE - URGENT,   URGENT       Question Answer Comment  Patient immune status Normal   Release to patient Immediate      02/22/23 0654   02/22/23 0655  Culture, blood (Routine X 2) w Reflex to ID Panel  BLOOD CULTURE X 2,   R (with TIMED occurrences)     Question:  Patient immune status  Answer:  Normal   02/22/23 0654   02/22/23 0655  Expectorated Sputum Assessment w Gram Stain, Rflx to Resp Cult  Once,   R       Question Answer Comment  Patient immune status Normal   Release to patient Immediate      02/22/23 0654   02/22/23 0655  Legionella Pneumophila Serogp 1 Ur Ag  Once,   R        02/22/23 0655   02/22/23 0653  Hepatic function panel  Add-on,   AD        02/22/23 0981   02/22/23 1914  Basic metabolic panel  Every 6 hours,   R      02/22/23 0632   02/22/23 0633  Osmolality  Add-on,   AD        02/22/23 7829   02/22/23 0633  Sodium, urine, random  Once,   R        02/22/23 0632   02/22/23 0633  Osmolality, urine  Once,   R        02/22/23 5621  02/22/23 0633  Rapid urine drug screen (hospital performed)  ONCE - STAT,   STAT        02/22/23 0632   02/22/23 0632  CBC with Differential/Platelet  Once,   R        02/22/23 0631   02/22/23 0632  TSH  Add-on,   AD        02/22/23 0631            Vitals/Pain Today's Vitals   02/22/23 0609 02/22/23 0617 02/22/23 0624 02/22/23 0634  BP: 120/64 120/64 120/64 115/70  Pulse: 92  (!) 143   Resp: (!) 27 (!) 22  19  Temp:      TempSrc:      SpO2: 96%   96%  Weight:    49 kg  Height:    5\' 2"   (1.575 m)    Isolation Precautions Droplet precaution  Medications Medications  diltiazem (CARDIZEM) 1 mg/mL load via infusion 15 mg (15 mg Intravenous Bolus from Bag 02/22/23 0533)    And  diltiazem (CARDIZEM) 125 mg in dextrose 5% 125 mL (1 mg/mL) infusion (10 mg/hr Intravenous Rate/Dose Change 02/22/23 0610)  LORazepam (ATIVAN) injection 0-4 mg (has no administration in time range)    Or  LORazepam (ATIVAN) tablet 0-4 mg (has no administration in time range)  LORazepam (ATIVAN) injection 0-4 mg (has no administration in time range)    Or  LORazepam (ATIVAN) tablet 0-4 mg (has no administration in time range)  thiamine (VITAMIN B1) tablet 100 mg (has no administration in time range)    Or  thiamine (VITAMIN B1) injection 100 mg (has no administration in time range)  famotidine (PEPCID) tablet 20 mg (has no administration in time range)  fluticasone furoate-vilanterol (BREO ELLIPTA) 200-25 MCG/ACT 1 puff (has no administration in time range)  0.9 %  sodium chloride infusion (has no administration in time range)  sodium chloride flush (NS) 0.9 % injection 3 mL (has no administration in time range)  sodium chloride flush (NS) 0.9 % injection 3 mL (has no administration in time range)  0.9 %  sodium chloride infusion (has no administration in time range)  acetaminophen (TYLENOL) tablet 650 mg (has no administration in time range)    Or  acetaminophen (TYLENOL) suppository 650 mg (has no administration in time range)  ondansetron (ZOFRAN) tablet 4 mg (has no administration in time range)    Or  ondansetron (ZOFRAN) injection 4 mg (has no administration in time range)  senna-docusate (Senokot-S) tablet 1 tablet (has no administration in time range)  insulin aspart (novoLOG) injection 0-6 Units (has no administration in time range)  insulin aspart (novoLOG) injection 0-5 Units (has no administration in time range)  levalbuterol (XOPENEX) nebulizer solution 0.63 mg (has no administration in  time range)  nicotine (NICODERM CQ - dosed in mg/24 hours) patch 21 mg (has no administration in time range)  cefTRIAXone (ROCEPHIN) 1 g in sodium chloride 0.9 % 100 mL IVPB (has no administration in time range)  doxycycline (VIBRA-TABS) tablet 100 mg (has no administration in time range)  sodium chloride 0.9 % bolus 1,000 mL (1,000 mLs Intravenous New Bag/Given 02/22/23 0531)    Mobility walks     Focused Assessments Cardiac Assessment Handoff:  Cardiac Rhythm: Atrial fibrillation Lab Results  Component Value Date   CKTOTAL 60 02/10/2022   CKMB 2.6 09/29/2011   TROPONINI <0.03 10/14/2015   Lab Results  Component Value Date   DDIMER 0.77 (H) 11/20/2022   Does the  Patient currently have chest pain? Yes   , Neuro Assessment Handoff:  Swallow screen pass? Yes  Cardiac Rhythm: Atrial fibrillation       Neuro Assessment:   Neuro Checks:      Has TPA been given? No If patient is a Neuro Trauma and patient is going to OR before floor call report to 4N Charge nurse: [phone] or [phone]  , Pulmonary Assessment Handoff:  Lung sounds: Bilateral Breath Sounds: Diminished O2 Device: Nasal Cannula O2 Flow Rate (L/min): 3 L/min    R Recommendations: See Admitting Provider Note  Report given to:   Additional Notes:

## 2023-02-22 NOTE — Progress Notes (Signed)
ANTICOAGULATION CONSULT NOTE - Initial Consult  Pharmacy Consult for heparin Indication: atrial fibrillation  Allergies  Allergen Reactions   Iron Dextran Shortness Of Breath and Anxiety   Aspirin Nausea And Vomiting and Other (See Comments)    Ok to take tylenol or ibuprofen    Penicillins Other (See Comments)    Unknown. Childhood reaction     Patient Measurements: Height: 5\' 2"  (157.5 cm) Weight: 49 kg (108 lb 0.4 oz) IBW/kg (Calculated) : 50.1 Heparin Dosing Weight: 49 kg  Vital Signs: Temp: 97.9 F (36.6 C) (09/01 0500) Temp Source: Oral (09/01 0500) BP: 115/70 (09/01 0634) Pulse Rate: 143 (09/01 0624)  Labs: Recent Labs    02/21/23 1716 02/22/23 0515  HGB 10.1* 8.2*  HCT 33.6* 27.4*  PLT 328 276  CREATININE 0.46 0.65  TROPONINIHS 8  --     Estimated Creatinine Clearance: 59.3 mL/min (by C-G formula based on SCr of 0.65 mg/dL).   Medical History: Past Medical History:  Diagnosis Date   Abdominal pain    Accidental drug overdose April 2013   Anxiety    Atrial fibrillation (HCC) 09/29/11   converted spontaneously   Chronic back pain    Chronic knee pain    Chronic nausea    Chronic pain    COPD (chronic obstructive pulmonary disease) (HCC)    Depression    Diabetes mellitus    states her doctor took her off all DM meds in past month   Diabetic neuropathy (HCC)    Dyspnea    with exertion    GERD (gastroesophageal reflux disease)    Headache(784.0)    migraines    HTN (hypertension)    not on meds since in a year    Hyperlipidemia    Hypothyroidism    not on meds in a while    Mental disorder    Bipolar and schizophrenic   Nausea and vomiting 01/02/2023   Requires supplemental oxygen    as needed per patient    Schizophrenia (HCC)    Schizophrenia, acute (HCC) 11/13/2017   Tobacco abuse    Assessment: 1 yoF presented to ED with shortness of breath. PMH includes afib (no record of anticoagulants) and COPD. Pharmacy consulted to dose  heparin for afib. Patient was in afib RVR on 8/31 and left AMA. Hgb 8.2, plts 276  Goal of Therapy:  Heparin level 0.3-0.7 units/ml Monitor platelets by anticoagulation protocol: Yes   Plan:  Give 2000 units bolus x 1 Start heparin infusion at 650 units/hr Check anti-Xa level in 8 hours and daily while on heparin Continue to monitor H&H and platelets  Arabella Merles, PharmD. Clinical Pharmacist 02/22/2023 6:59 AM

## 2023-02-22 NOTE — Progress Notes (Signed)
PROGRESS NOTE    [name]  NFA:[id number] DOB: 1966-01-02 DOA: 02/22/2023 PCP: Fleet Contras, MD    Brief Narrative:   [name] is a 57 y.o. female with past medical history significant for COPD, DM2, chronic alcohol use, schizophrenia who presented to [facility] ED multiple times with complaints of palpitations.  Earlier in the day she was seen; found to have A-fib with RVR but refused care and left; returned once again in the evening.  Patient also endorsed shortness of breath.  She continues to report drinking alcohol on a regular basis, last drink reported 5 PM on 02/21/2023.  Denies cough, no sputum production, no fever/chills.  In the ED, temperature 97.9 F, HR 164, RR 21, BP 119/70, SpO2 100% on 3 L nasal cannula.  WBC 6.9, hemoglobin 8.2, platelet count 276.  Sodium 127, potassium 4.8, chloride 100, CO2 13, glucose 349, BUN 8, creatinine 0.65.  EtOH level 128.  UDS negative.  Chest x-ray with new patchy opacities perihilar left midlung, left lower lung and right base consistent with atelectasis versus pneumonia.  EKG with A-fib with RVR, rate 175.  Patient received Cardizem 15 mg IV x 1 and placed on a Cardizem drip.  TRH consulted for admission for further evaluation and management of A-fib with RVR, acute alcohol intoxication.  Assessment & Plan:   Atrial fibrillation with RVR Patient presenting to ED with palpitations.  Patient was afebrile without leukocytosis.  Heart rate in the 160s-170s.  EKG confirming A-fib with RVR.  Complicated by excessive alcohol use.  TSH within normal limits.  UDS negative.  CHA2DS2-VASc score = 3. -- Cardiology following, appreciate assistance -- Cardizem drip -- Heparin drip; although unlikely anticoagulation candidate on discharge given severe alcohol use disorder -- TTE: Pending --Troponin: Pending -- Monitor on telemetry  Acute alcohol intoxication Chronic alcohol use disorder None anion gap metabolic acidosis Patient  endorses drinking 240 ounce beers daily.  Denies any history of severe withdrawal symptoms or seizures.  EtOH level 128 on admission.  Counseled on need for complete cessation/abstinence. -- CIWA protocol with symptom triggered Ativan -- Thiamine, folate, multivitamin -- TOC for substance abuse resources  Community-acquired pneumonia Chest x-ray with new patchy opacities perihilar left midlung, left lower lung and right base consistent with atelectasis versus pneumonia.  Denies cough, afebrile without leukocytosis. -- Respiratory viral panel, sputum culture, procalcitonin: Pending -- Doxycycline 100 mg p.o. every 12 hours -- Ceftriaxone 1 g IV every 24 hours -- Incentive spirometry  Hyponatremia Sodium 127 on admission.  Baseline roughly 133.  Etiology likely secondary to beer potomania in the setting of excessive alcohol use.  TSH 0.893.  Serum osmolality 302, urine sodium less than 10. -- Na 127>132 -- Urine osmolality: Pending -- decrease NS to 50 mL/h -- BMP every 12 hours  Back pain Patient reports recent onset of low back pain.  Unsure if related to fall. -- X-ray T/L-spine: Pending --Tylenol 600 mg every 6 hours as needed mild pain -- Tramadol 50 mg p.o. every 6 hours as needed moderate pain -- Robaxin 5 mg p.o. every 6 hours as needed muscle spasms  COPD Patient reports not oxygen dependent at baseline; but home medication list oxygen 4-5 L as needed.  Reports that she ran out of her inhalers. -- Breo Ellipta 1 puff daily (substituted for home Symbicort) -- Xopenex neb every 6 hours as needed wheezing/shortness of breath -- Continue supplemental oxygen, maintain SpO2 greater than 88%  Type 2 diabetes mellitus, with  hyperglycemia Recent hemoglobin A1c 7.3 on 01/11/2023.  Metformin listed on her home medication list but reports she is not taking. -- SSI for coverage -- CBGs qAC/HS  History of mood disorder/schizophrenia Currently not on any medication outpatient.   Outpatient follow-up with behavioral health  Tobacco use disorder Counseled on need for complete cessation/abstinence --Nicotine patch   DVT prophylaxis: heparin bolus via infusion 2,000 Units Start: 02/22/23 0715 SCDs Start: 02/22/23 1610    Code Status: Full Code Family Communication: Daughter via telephone  Disposition Plan:  Level of care: Progressive Status is: Inpatient Remains inpatient appropriate because: IV fluids, Cardizem drip, heparin drip, needs optimize control of her heart rate before stable for discharge home    Consultants:  Cardiology  Procedures:  TTE: Pending  Antimicrobials:  Doxycycline Traxam   Subjective: Patient seen examined bedside, lying in bed.  RN and cardiology PA at bedside.  RN is attempting to establish IV access.  Received message earlier this morning that patient belligerent and threatening to leave AMA, but now amenable to continue hospitalization.  Patient remains tachycardic as Cardizem drip not initiated as of yet.  Cardiology PA updating patient's daughter via telephone in the room.  Patient also complaining of back pain, unclear if she had a fall.  Discussed with patient needs complete cessation/abstinence from alcohol as this is likely a confounding factor to her poorly controlled atrial fibrillation as well as other comorbidities.  Patient states only uses an inhaler at home which she has run out but denies home metformin use.  Asking for pain medication.  Does endorse current palpitations.  No other specific questions or concerns at this time.  Denies headache, no visual changes, no current chest pain, no fever/chills, no nausea/vomiting/diarrhea, no abdominal pain, no focal weakness, no fatigue, no paresthesias.  No other acute events per RN this morning.  Objective: Vitals:   02/22/23 0700 02/22/23 0746 02/22/23 0908 02/22/23 0915  BP: 105/81 116/70    Pulse: (!) 49 (!) 112    Resp: 18 (!) 24    Temp:  97.8 F (36.6 C)     TempSrc:  Oral    SpO2: (!) 83% 95% 100% 99%  Weight:  54.5 kg    Height:  5\' 2"  (1.575 m)     No intake or output data in the 24 hours ending 02/22/23 0941 Filed Weights   02/22/23 0634 02/22/23 0746  Weight: 49 kg 54.5 kg    Examination:  Physical Exam: GEN: NAD, alert and oriented x 3, chronically ill in appearance, appears older than stated age HEENT: NCAT, PERRL, EOMI, sclera clear, MMM PULM: + Wheezing, diminished breath sounds bilateral bases without crackles, normal respiratory effort without accessory muscle use, on 3 L nasal cannula with SpO2 100% at rest CV: Tachycardic, irregularly irregular rhythm w/o M/G/R GI: abd soft, NTND, NABS, no R/G/M MSK: no peripheral edema, moves all extremities independently NEURO: No focal neurological deficits PSYCH: normal mood/affect Integumentary: Abrasion noted to right knee, otherwise no other concerning rashes/lesions/wounds noted on exposed skin surfaces    Data Reviewed: I have personally reviewed following labs and imaging studies  CBC: Recent Labs  Lab 02/21/23 1716 02/22/23 0515 02/22/23 0801  WBC 6.9 6.9 7.7  NEUTROABS  --   --  6.7  HGB 10.1* 8.2* 8.7*  HCT 33.6* 27.4* 30.3*  MCV 78.5* 80.4 83.7  PLT 328 276 PLATELET CLUMPS NOTED ON SMEAR, UNABLE TO ESTIMATE   Basic Metabolic Panel: Recent Labs  Lab 02/21/23 1716 02/22/23 0515  02/22/23 0801  NA 133* 127* 132*  K 3.9 4.8 4.2  CL 104 100 104  CO2 18* 13* 14*  GLUCOSE 181* 349* 310*  BUN 7 8 7   CREATININE 0.46 0.65 0.62  CALCIUM 8.5* 8.1* 8.2*  MG  --  2.1  --    GFR: Estimated Creatinine Clearance: 60.6 mL/min (by C-G formula based on SCr of 0.62 mg/dL). Liver Function Tests: Recent Labs  Lab 02/22/23 0801  AST 17  ALT 17  ALKPHOS 96  BILITOT 0.3  PROT 6.2*  ALBUMIN 3.0*   No results for input(s): "LIPASE", "AMYLASE" in the last 168 hours. No results for input(s): "AMMONIA" in the last 168 hours. Coagulation Profile: No results for  input(s): "INR", "PROTIME" in the last 168 hours. Cardiac Enzymes: No results for input(s): "CKTOTAL", "CKMB", "CKMBINDEX", "TROPONINI" in the last 168 hours. BNP (last 3 results) No results for input(s): "PROBNP" in the last 8760 hours. HbA1C: No results for input(s): "HGBA1C" in the last 72 hours. CBG: No results for input(s): "GLUCAP" in the last 168 hours. Lipid Profile: No results for input(s): "CHOL", "HDL", "LDLCALC", "TRIG", "CHOLHDL", "LDLDIRECT" in the last 72 hours. Thyroid Function Tests: No results for input(s): "TSH", "T4TOTAL", "FREET4", "T3FREE", "THYROIDAB" in the last 72 hours. Anemia Panel: No results for input(s): "VITAMINB12", "FOLATE", "FERRITIN", "TIBC", "IRON", "RETICCTPCT" in the last 72 hours. Sepsis Labs: No results for input(s): "PROCALCITON", "LATICACIDVEN" in the last 168 hours.  No results found for this or any previous visit (from the past 240 hour(s)).       Radiology Studies: DG Chest Port 1 View  Result Date: 02/22/2023 CLINICAL DATA:  chest pain and palpitations. EXAM: PORTABLE CHEST 1 VIEW COMPARISON:  02/20/2021 FINDINGS: Heart size and mediastinal contours are unremarkable. No pleural fluid or interstitial edema. New patchy opacities within the perihilar left mid lung, left lower lung and right base identified. Visualized osseous structures are unremarkable. IMPRESSION: New patchy opacities within the perihilar left mid lung, left lower lung and right base. Findings may reflect areas of atelectasis or pneumonia. Electronically Signed   By: Signa Kell M.D.   On: 02/22/2023 06:48   DG Chest Portable 1 View  Result Date: 02/21/2023 CLINICAL DATA:  Shortness of breath.  Discharged earlier today. EXAM: PORTABLE CHEST 1 VIEW COMPARISON:  02/15/2023 FINDINGS: Numerous leads and wires project over the chest. Patient rotated minimally right. Midline trachea. Normal heart size. No pleural effusion or pneumothorax. Mild lower lung predominant interstitial  thickening is likely related to smoking/chronic bronchitis. IMPRESSION: No acute cardiopulmonary disease. Electronically Signed   By: Jeronimo Greaves M.D.   On: 02/21/2023 18:54        Scheduled Meds:  doxycycline  100 mg Oral Q12H   famotidine  20 mg Oral Daily   fluticasone furoate-vilanterol  1 puff Inhalation Daily   heparin  2,000 Units Intravenous Once   insulin aspart  0-5 Units Subcutaneous QHS   insulin aspart  0-6 Units Subcutaneous TID WC   LORazepam  0-4 mg Intravenous Q6H   Or   LORazepam  0-4 mg Oral Q6H   [START ON 02/24/2023] LORazepam  0-4 mg Intravenous Q12H   Or   [START ON 02/24/2023] LORazepam  0-4 mg Oral Q12H   nicotine  21 mg Transdermal Daily   sodium chloride flush  3 mL Intravenous Q12H   thiamine  100 mg Oral Daily   Or   thiamine  100 mg Intravenous Daily   Continuous Infusions:  sodium chloride  sodium chloride     [START ON 02/23/2023] cefTRIAXone (ROCEPHIN)  IV     cefTRIAXone (ROCEPHIN)  IV     diltiazem (CARDIZEM) infusion 10 mg/hr (02/22/23 0610)   heparin       LOS: 0 days    Time spent: 56 minutes spent on chart review, discussion with nursing staff, consultants, updating family and interview/physical exam; more than 50% of that time was spent in counseling and/or coordination of care.    Alvira Philips [place], DO [facility] Available via Epic secure chat 7am-7pm After these hours, please refer to coverage provider listed on [email or web address] 02/22/2023, 9:41 AM

## 2023-02-23 ENCOUNTER — Other Ambulatory Visit: Payer: Self-pay

## 2023-02-23 ENCOUNTER — Emergency Department (HOSPITAL_COMMUNITY)
Admission: EM | Admit: 2023-02-23 | Discharge: 2023-02-24 | Disposition: A | Discharge status: Home / Self Care | Payer: 59 | Source: Home / Self Care | Attending: Emergency Medicine | Admitting: Emergency Medicine

## 2023-02-23 ENCOUNTER — Emergency Department (HOSPITAL_COMMUNITY): Payer: 59

## 2023-02-23 ENCOUNTER — Encounter (HOSPITAL_COMMUNITY): Payer: Self-pay | Admitting: Emergency Medicine

## 2023-02-23 DIAGNOSIS — J441 Chronic obstructive pulmonary disease with (acute) exacerbation: Secondary | ICD-10-CM | POA: Insufficient documentation

## 2023-02-23 DIAGNOSIS — R000 Tachycardia, unspecified: Secondary | ICD-10-CM | POA: Diagnosis not present

## 2023-02-23 DIAGNOSIS — R7989 Other specified abnormal findings of blood chemistry: Secondary | ICD-10-CM | POA: Insufficient documentation

## 2023-02-23 DIAGNOSIS — I4891 Unspecified atrial fibrillation: Secondary | ICD-10-CM | POA: Insufficient documentation

## 2023-02-23 DIAGNOSIS — I480 Paroxysmal atrial fibrillation: Secondary | ICD-10-CM

## 2023-02-23 DIAGNOSIS — Z7951 Long term (current) use of inhaled steroids: Secondary | ICD-10-CM | POA: Insufficient documentation

## 2023-02-23 DIAGNOSIS — I10 Essential (primary) hypertension: Secondary | ICD-10-CM | POA: Insufficient documentation

## 2023-02-23 DIAGNOSIS — E119 Type 2 diabetes mellitus without complications: Secondary | ICD-10-CM | POA: Insufficient documentation

## 2023-02-23 DIAGNOSIS — M546 Pain in thoracic spine: Secondary | ICD-10-CM | POA: Insufficient documentation

## 2023-02-23 DIAGNOSIS — Y900 Blood alcohol level of less than 20 mg/100 ml: Secondary | ICD-10-CM | POA: Insufficient documentation

## 2023-02-23 DIAGNOSIS — F17210 Nicotine dependence, cigarettes, uncomplicated: Secondary | ICD-10-CM | POA: Insufficient documentation

## 2023-02-23 DIAGNOSIS — Z7984 Long term (current) use of oral hypoglycemic drugs: Secondary | ICD-10-CM | POA: Diagnosis not present

## 2023-02-23 DIAGNOSIS — R0602 Shortness of breath: Secondary | ICD-10-CM | POA: Diagnosis not present

## 2023-02-23 DIAGNOSIS — R079 Chest pain, unspecified: Secondary | ICD-10-CM | POA: Diagnosis not present

## 2023-02-23 DIAGNOSIS — M545 Low back pain, unspecified: Secondary | ICD-10-CM | POA: Diagnosis not present

## 2023-02-23 DIAGNOSIS — R918 Other nonspecific abnormal finding of lung field: Secondary | ICD-10-CM | POA: Diagnosis not present

## 2023-02-23 DIAGNOSIS — F1010 Alcohol abuse, uncomplicated: Secondary | ICD-10-CM | POA: Insufficient documentation

## 2023-02-23 DIAGNOSIS — F1310 Sedative, hypnotic or anxiolytic abuse, uncomplicated: Secondary | ICD-10-CM | POA: Insufficient documentation

## 2023-02-23 DIAGNOSIS — R0989 Other specified symptoms and signs involving the circulatory and respiratory systems: Secondary | ICD-10-CM | POA: Diagnosis not present

## 2023-02-23 LAB — CBC WITH DIFFERENTIAL/PLATELET
Abs Immature Granulocytes: 0.01 10*3/uL (ref 0.00–0.07)
Basophils Absolute: 0 10*3/uL (ref 0.0–0.1)
Basophils Relative: 1 %
Eosinophils Absolute: 0 10*3/uL (ref 0.0–0.5)
Eosinophils Relative: 1 %
HCT: 32.6 % — ABNORMAL LOW (ref 36.0–46.0)
Hemoglobin: 9.3 g/dL — ABNORMAL LOW (ref 12.0–15.0)
Immature Granulocytes: 0 %
Lymphocytes Relative: 27 %
Lymphs Abs: 1.6 10*3/uL (ref 0.7–4.0)
MCH: 23 pg — ABNORMAL LOW (ref 26.0–34.0)
MCHC: 28.5 g/dL — ABNORMAL LOW (ref 30.0–36.0)
MCV: 80.7 fL (ref 80.0–100.0)
Monocytes Absolute: 0.4 10*3/uL (ref 0.1–1.0)
Monocytes Relative: 7 %
Neutro Abs: 3.8 10*3/uL (ref 1.7–7.7)
Neutrophils Relative %: 64 %
Platelets: 270 10*3/uL (ref 150–400)
RBC: 4.04 MIL/uL (ref 3.87–5.11)
RDW: 20.2 % — ABNORMAL HIGH (ref 11.5–15.5)
WBC: 5.9 10*3/uL (ref 4.0–10.5)
nRBC: 0 % (ref 0.0–0.2)

## 2023-02-23 LAB — COMPREHENSIVE METABOLIC PANEL
ALT: 20 U/L (ref 0–44)
AST: 23 U/L (ref 15–41)
Albumin: 3.1 g/dL — ABNORMAL LOW (ref 3.5–5.0)
Alkaline Phosphatase: 92 U/L (ref 38–126)
Anion gap: 12 (ref 5–15)
BUN: 5 mg/dL — ABNORMAL LOW (ref 6–20)
CO2: 22 mmol/L (ref 22–32)
Calcium: 8.6 mg/dL — ABNORMAL LOW (ref 8.9–10.3)
Chloride: 103 mmol/L (ref 98–111)
Creatinine, Ser: 0.4 mg/dL — ABNORMAL LOW (ref 0.44–1.00)
GFR, Estimated: >60 mL/min (ref >60)
Glucose, Bld: 148 mg/dL — ABNORMAL HIGH (ref 70–99)
Potassium: 4.2 mmol/L (ref 3.5–5.1)
Sodium: 137 mmol/L (ref 135–145)
Total Bilirubin: 0.4 mg/dL (ref 0.3–1.2)
Total Protein: 6.4 g/dL — ABNORMAL LOW (ref 6.5–8.1)

## 2023-02-23 LAB — BRAIN NATRIURETIC PEPTIDE: B Natriuretic Peptide: 350.8 pg/mL — ABNORMAL HIGH (ref 0.0–100.0)

## 2023-02-23 LAB — ETHANOL: Alcohol, Ethyl (B): <10 mg/dL (ref <10)

## 2023-02-23 LAB — TROPONIN I (HIGH SENSITIVITY): Troponin I (High Sensitivity): 6 ng/L (ref <18)

## 2023-02-23 MED ORDER — THIAMINE HCL 100 MG/ML IJ SOLN: 100.0000 mg | Freq: Every day | INTRAMUSCULAR | Status: DC

## 2023-02-23 MED ORDER — LORAZEPAM 1 MG PO TABS
1.0000 mg | ORAL_TABLET | ORAL | Status: DC | PRN
  Administered 2023-02-23 – 2023-02-24 (×2): 1 mg via ORAL
  Filled 2023-02-23 (×2): qty 1

## 2023-02-23 MED ORDER — IPRATROPIUM-ALBUTEROL 0.5-2.5 (3) MG/3ML IN SOLN
3.0000 mL | Freq: Once | RESPIRATORY_TRACT | Status: AC
  Administered 2023-02-23: 3 mL via RESPIRATORY_TRACT
  Filled 2023-02-23: qty 3

## 2023-02-23 MED ORDER — ONDANSETRON HCL 4 MG/2ML IJ SOLN
4.0000 mg | Freq: Once | INTRAMUSCULAR | Status: DC
  Filled 2023-02-23: qty 2

## 2023-02-23 MED ORDER — ZOLPIDEM TARTRATE 5 MG PO TABS
5.0000 mg | ORAL_TABLET | Freq: Every evening | ORAL | Status: DC | PRN
  Administered 2023-02-23: 5 mg via ORAL
  Filled 2023-02-23: qty 1

## 2023-02-23 MED ORDER — THIAMINE MONONITRATE 100 MG PO TABS
100.0000 mg | ORAL_TABLET | Freq: Every day | ORAL | Status: DC
  Administered 2023-02-23: 100 mg via ORAL
  Filled 2023-02-23: qty 1

## 2023-02-23 MED ORDER — LORAZEPAM 2 MG/ML IJ SOLN: 1.0000 mg | INTRAMUSCULAR | Status: DC | PRN

## 2023-02-23 MED ORDER — FOLIC ACID 1 MG PO TABS
1.0000 mg | ORAL_TABLET | Freq: Every day | ORAL | Status: DC
  Administered 2023-02-23: 1 mg via ORAL
  Filled 2023-02-23: qty 1

## 2023-02-23 NOTE — Progress Notes (Signed)
Patient wanting to leave the hospital and go home.She pulled both of her IVs out.This RN advised the patient to get another IV since she is on heparin drip. The patient refused and removed her telemetry leads as well. Patient saying no one is helping her and just wants to go. This RN and the charge nurse educated the patient on staying and getting her medications.The patient is adamant on leaving. She tried to call her daughter [name], but she did not answer. Patient said she is going to go in the ED and wait for her ride.   Dr. Julian Reil made aware of the situation. Patient is oriented to make decisions by herself. AMA form given to the patient to sign but the patient refused to sign the papers. The patient got dressed in her street clothes and packed all her belongings. Patient took few tylenol and 2 gabapentin pills that were in her belonging bag. Charge nurse and this RN frequently advised the patient to stay until her daughter calls her back. The patient refused to stay and also refused to sign the AMA papers. Security called. Security escorted the patient out of the unit.  Few minutes later patient came back again to the unit with Dr. Julian Reil. Patient asking for a taxi voucher. This RN suggested the patient to stay in her room 2C-14 until she gets a ride or wait till 7 am as no taxi vouchers were found. Patient refused to stay in the room and walked out of the unit.

## 2023-02-23 NOTE — Progress Notes (Signed)
Patient converted back to NSR. EKG obtained and placed in patient's chart.Cardizem gtt stopped. Dr. Julian Reil made aware.

## 2023-02-23 NOTE — ED Provider Notes (Signed)
[place] EMERGENCY DEPARTMENT AT [facility] Provider Note   CSN: [id number] Arrival date & time: 02/23/23  2115     History Chief Complaint  Patient presents with   Shortness of Breath   Chest Pain    [name] is a 57 y.o. female This is a 57 year old female well-known to our emergency department with a history of COPD, atrial fibrillation, multiple ED visits presents the emerged from today for evaluation of shortness of breath.  Patient was recently left AGAINST MEDICAL ADVICE earlier this morning from the hospital after being admitted for A-fib RVR, alcohol abuse, and hyponatremia. She repots that she was having some chest pain and SOB from this AM. Denies any leg swelling. She reports she is having some nausea and 2 episodes of emesis which does appear chronic for her. Denies any abdominal pain. She reports her back pain is unchanged from previous visits. She reports her last drink or alcohol was this AM.    Shortness of Breath Associated symptoms: chest pain, cough and vomiting   Associated symptoms: no abdominal pain and no fever   Chest Pain Associated symptoms: cough, nausea, shortness of breath and vomiting   Associated symptoms: no abdominal pain and no fever        Home Medications Prior to Admission medications   Medication Sig Start Date End Date Taking? Authorizing Provider  albuterol (VENTOLIN HFA) 108 (90 Base) MCG/ACT inhaler Inhale 1-2 puffs into the lungs every 6 (six) hours as needed for wheezing or shortness of breath. 02/10/23   Rancour, Jeannett Senior, MD  budesonide-formoterol (SYMBICORT) 160-4.5 MCG/ACT inhaler Inhale 2 puffs into the lungs 2 (two) times daily. 12/01/22   Ghimire, Werner Lean, MD  famotidine (PEPCID) 20 MG tablet Take 1 tablet (20 mg total) by mouth daily. 01/24/23   Elayne Snare K, DO  hyoscyamine (LEVSIN/SL) 0.125 MG SL tablet Place 1 tablet (0.125 mg total) under the tongue every 4 (four) hours as needed (pain). Up to 1.25 mg  daily Patient not taking: Reported on 02/22/2023 01/23/23   Arthor Captain, PA-C  metFORMIN (GLUCOPHAGE-XR) 750 MG 24 hr tablet Take 1 tablet (750 mg total) by mouth daily. 02/12/23 03/14/23  Jeannie Fend, PA-C  ondansetron (ZOFRAN) 4 MG tablet Take 1 tablet (4 mg total) by mouth every 6 (six) hours. Patient not taking: Reported on 02/22/2023 01/20/23   Prosperi, Christian H, PA-C  ondansetron (ZOFRAN-ODT) 4 MG disintegrating tablet Take 1 tablet (4 mg total) by mouth every 8 (eight) hours as needed for nausea or vomiting. 01/23/23   Arthor Captain, PA-C  OXYGEN Inhale 4-5 L into the lungs as needed (COPD).    [provider]  sucralfate (CARAFATE) 1 g tablet Take 1 tablet (1 g total) by mouth 4 (four) times daily -  with meals and at bedtime. Patient not taking: Reported on 02/22/2023 02/11/23   Horton, Mayer Masker, MD  zonisamide (ZONEGRAN) 100 MG capsule Take 2 capsules (200 mg total) by mouth daily. Patient not taking: Reported on 02/22/2023 01/12/23   Lewie Chamber, MD      Allergies    Iron dextran, Aspirin, and Penicillins    Review of Systems   Review of Systems  Constitutional:  Negative for chills and fever.  Respiratory:  Positive for cough and shortness of breath.   Cardiovascular:  Positive for chest pain.  Gastrointestinal:  Positive for nausea and vomiting. Negative for abdominal pain.  Genitourinary:  Negative for dysuria and hematuria.  Neurological:  Negative for  syncope and light-headedness.    Physical Exam Updated Vital Signs BP 114/72 (BP Location: Left Arm)   Pulse (!) 115   Temp 98.7 F (37.1 C) (Oral)   Resp 19   Ht 5\' 2"  (1.575 m)   Wt 56.1 kg   LMP 01/08/2011   SpO2 99%   BMI 22.62 kg/m  Physical Exam Constitutional:      Appearance: She is not toxic-appearing.  Cardiovascular:     Rate and Rhythm: Tachycardia present.  Pulmonary:     Effort: Pulmonary effort is normal. No tachypnea.     Breath sounds: Examination of the right-upper field reveals  wheezing. Examination of the left-upper field reveals wheezing. Examination of the right-middle field reveals wheezing. Examination of the left-middle field reveals wheezing. Examination of the right-lower field reveals wheezing. Examination of the left-lower field reveals wheezing. Wheezing present.     Comments: Expiratory wheezing heard throughout. No rales auscultated. She is satting 100% on room air without any increase work of breathing. No accessory muscle use.  Abdominal:     Palpations: Abdomen is soft.     Tenderness: There is no abdominal tenderness. There is no guarding or rebound.  Musculoskeletal:     Right lower leg: No tenderness. No edema.     Left lower leg: No tenderness. No edema.     Comments: Diffuse upper back tenderness upon palpation. No overlying skin changes noted.   Neurological:     General: No focal deficit present.     Mental Status: She is alert.     ED Results / Procedures / Treatments   Labs (all labs ordered are listed, but only abnormal results are displayed) Labs Reviewed  CBC WITH DIFFERENTIAL/PLATELET - Abnormal; Notable for the following components:      Result Value   Hemoglobin 9.3 (*)    HCT 32.6 (*)    MCH 23.0 (*)    MCHC 28.5 (*)    RDW 20.2 (*)    All other components within normal limits  COMPREHENSIVE METABOLIC PANEL - Abnormal; Notable for the following components:   Glucose, Bld 148 (*)    BUN 5 (*)    Creatinine, Ser 0.40 (*)    Calcium 8.6 (*)    Total Protein 6.4 (*)    Albumin 3.1 (*)    All other components within normal limits  URINALYSIS, ROUTINE W REFLEX MICROSCOPIC - Abnormal; Notable for the following components:   Color, Urine STRAW (*)    All other components within normal limits  RAPID URINE DRUG SCREEN, HOSP PERFORMED - Abnormal; Notable for the following components:   Benzodiazepines POSITIVE (*)    All other components within normal limits  BRAIN NATRIURETIC PEPTIDE - Abnormal; Notable for the following  components:   B Natriuretic Peptide 350.8 (*)    All other components within normal limits  ETHANOL  TROPONIN I (HIGH SENSITIVITY)  TROPONIN I (HIGH SENSITIVITY)    EKG None  Radiology DG Chest Portable 1 View  Result Date: 02/23/2023 CLINICAL DATA:  Chest pain. EXAM: PORTABLE CHEST 1 VIEW COMPARISON:  Radiograph yesterday FINDINGS: Lower lung volumes from yesterday. Improving patchy bilateral airspace opacities, mild residual in the left mid lower lung zone. No new or progressive consolidation. No pulmonary edema, pleural effusion or pneumothorax. Stable osseous structures. IMPRESSION: Lower lung volumes from yesterday. Improving patchy bilateral airspace opacities, mild residual in the left mid lower lung zone. Electronically Signed   By: Narda Rutherford M.D.   On: 02/23/2023 22:21  DG THORACOLUMABAR SPINE  Result Date: 02/22/2023 CLINICAL DATA:  Back pain worse with motion and supine positioning EXAM: THORACOLUMBAR SPINE 1V COMPARISON:  08/07/2005, 02/14/2023 FINDINGS: Frontal and lateral views of the thoracolumbar spine are obtained, including T6 through L5. There are 5 non-rib-bearing lumbar type vertebral bodies. Alignment is grossly anatomic. There are stable anterior wedge compression deformities of T6 and T9. No new fractures are seen from the T6 through L5 levels. Mild spondylosis within the lower thoracic spine unchanged. Diffuse facet hypertrophic changes within the lumbar spine greatest at L4-5 and L5-S1. Paraspinal soft tissues are unremarkable. IMPRESSION: 1. Stable compression deformities at T6 and T9, unchanged since recent CT 02/14/2023. 2. Multilevel spondylosis and facet hypertrophy. 3. No new fractures. Electronically Signed   By: Sharlet Salina M.D.   On: 02/22/2023 17:09   DG Chest Port 1 View  Result Date: 02/22/2023 CLINICAL DATA:  chest pain and palpitations. EXAM: PORTABLE CHEST 1 VIEW COMPARISON:  02/20/2021 FINDINGS: Heart size and mediastinal contours are  unremarkable. No pleural fluid or interstitial edema. New patchy opacities within the perihilar left mid lung, left lower lung and right base identified. Visualized osseous structures are unremarkable. IMPRESSION: New patchy opacities within the perihilar left mid lung, left lower lung and right base. Findings may reflect areas of atelectasis or pneumonia. Electronically Signed   By: Signa Kell M.D.   On: 02/22/2023 06:48    Procedures Procedures   Medications Ordered in ED Medications  LORazepam (ATIVAN) tablet 1-4 mg (1 mg Oral Given 02/24/23 0108)    Or  LORazepam (ATIVAN) injection 1-4 mg ( Intravenous See Alternative 02/24/23 0108)  thiamine (VITAMIN B1) tablet 100 mg (100 mg Oral Given 02/23/23 2202)    Or  thiamine (VITAMIN B1) injection 100 mg ( Intravenous See Alternative 02/23/23 2202)  folic acid (FOLVITE) tablet 1 mg (1 mg Oral Given 02/23/23 2202)  methylPREDNISolone sodium succinate (SOLU-MEDROL) 125 mg/2 mL injection 125 mg (has no administration in time range)  magnesium sulfate IVPB 2 g 50 mL (has no administration in time range)  ipratropium-albuterol (DUONEB) 0.5-2.5 (3) MG/3ML nebulizer solution 3 mL (3 mLs Nebulization Given 02/23/23 2359)  prochlorperazine (COMPAZINE) injection 10 mg (10 mg Intramuscular Given 02/24/23 0009)    ED Course/ Medical Decision Making/ A&P Clinical Course as of 02/24/23 0133  Mon Feb 23, 2023  2339 Stable, admitted yesterday for a nonspecific opacity/heart failure exacerbation.  Prior CTs have demonstrated the same opacity in the past.  Troponin negative BNP slightly elevated but without findings of pulmonary edema on her x-ray.  Oxygen saturation is 100% on room air.  Stable for outpatient care and management. [CC]    Clinical Course User Index [CC] Glyn Ade, MD    Medical Decision Making Amount and/or Complexity of Data Reviewed Labs: ordered. Radiology: ordered.  Risk OTC drugs. Prescription drug management.   57 y.o. female  presents to the ER for evaluation of chest pain and SOB. Differential diagnosis includes but is not limited to ACS, pericarditis, myocarditis, aortic dissection, PE, pneumothorax, esophageal spasm or rupture, chronic angina, pneumonia, bronchitis, GERD, reflux/PUD, biliary disease, pancreatitis, costochondritis, anxiety. Vital signs mild tachycardia, otherwise unremarkable. Physical exam as noted above.   CIWA protocol ordered for patient's alcohol use. She reports her last drink was this morning. She is requesting food and drink.   On previous chart evaluation, the patient seen at [facility] and left AMA. Then presented back to the ER and was admitted yesterday for afib RVR and left earlier today  AMA. She reports that her symptoms started this morning. It appears that the patient frequents the ER for 40 visits in the past 6 months for COPD, chest pain, and alcohol use. Currently, she does have expiratory wheezing throughout. Does not appear to be in any acute distress. Will obtain labs, CXR, and EKG.  I independently reviewed and interpreted the patient's labs.  CMP shows mildly elevated glucose at 148.  BUN and creatinine low at 5 and 0.40 respectively.  Calcium, protein, albumin low as well.  No electrolyte or LFT abnormality otherwise.  Troponin at 6 with repeat at 6.  Urinalysis shows straw-colored urine with positive for benzodiazepines however patient did receive Ativan here.  CBC does show hemoglobin 9.3 although this appears to be around chronic level.  Ethanol is less than 10.  BNP is elevated at 350.  BNP is elevated however patient is not appear significantly volume up.  There is no lower leg edema.  Chest x-ray does not show any signs of pulmonary edema as well.  Patient's chest x-ray from today is actually improving from yesterday.  Per radiology read.  EKG shows sinus tachycardia.   She patient started to dry heave which appears to be a chronic issue for her. Ordered some IM compazine. She did  receive ativan here for CIWA.   The patient was mildly tachycardic, but then worsening likely 2/2 the duoneb. EKG was obtained again and still shows sinus tachycardia. She does not appear in any acute distress and is resting comfortably on the stretcher requesting food and drinks.   Will order Magnesium and Solumedrol for Wheezing to see if this improves her wheezing. She is satting 97% on room air without any increase work of breathing. Speaking in full sentences with ease.   On re-evaluation, lung sounds have improved significantly. She reports that she is feeling better. She was sleeping, but easily awakens.   From chart review, it appears that the patient is chronically tachycardic and this is not a new finding. She is not having any tachypnea or hypoxia. She does not appear to be in any acute distress. She recently had CT imaging this week. Discussed with my attending who does not recommend any additional CT imaging. Back pain is reproducible upon palpation. She does not look volume up, however will discharge home on a short course of Lasix for her elevated BNP. Tachycardia has improved to her baseline level. Stable for discharge home.   I discussed this case with my attending physician who cosigned this note including patient's presenting symptoms, physical exam, and planned diagnostics and interventions. Attending physician stated agreement with plan or made changes to plan which were implemented.   Portions of this report may have been transcribed using voice recognition software. Every effort was made to ensure accuracy; however, inadvertent computerized transcription errors may be present.   Final Clinical Impression(s) / ED Diagnoses Final diagnoses:  COPD exacerbation (HCC)  Sinus tachycardia    Rx / DC Orders ED Discharge Orders          Ordered    Amb Referral to AFIB Clinic        02/24/23 0205    predniSONE (DELTASONE) 20 MG tablet  Daily        02/24/23 0222    furosemide  (LASIX) 20 MG tablet  Daily        02/24/23 0242              Achille Rich, PA-C 02/25/23 2213  Glyn Ade, MD 02/25/23 2326

## 2023-02-23 NOTE — ED Triage Notes (Signed)
Pt BIB EMS from home for CP, ShOB, and back pain. States she has CP all the time, increased with inspiration. EMS reports multiple empty beer cans at her feet upon arrival. Pt states "I only drank a 40".   EMS VS:  114/75 HR 117 97% 3LNC

## 2023-02-23 NOTE — Progress Notes (Signed)
CSW confirmed with PA. No TOC needs at this time.

## 2023-02-23 NOTE — ED Provider Notes (Incomplete)
[place] EMERGENCY DEPARTMENT AT [facility] Provider Note   CSN: [id number] Arrival date & time: 02/23/23  2115     History {Add pertinent medical, surgical, social history, OB history to HPI:1} Chief Complaint  Patient presents with  . Shortness of Breath  . Chest Pain    [name] is a 57 y.o. female.   Shortness of Breath Associated symptoms: chest pain   Chest Pain Associated symptoms: shortness of breath        Home Medications Prior to Admission medications   Medication Sig Start Date End Date Taking? Authorizing Provider  albuterol (VENTOLIN HFA) 108 (90 Base) MCG/ACT inhaler Inhale 1-2 puffs into the lungs every 6 (six) hours as needed for wheezing or shortness of breath. 02/10/23   Rancour, Jeannett Senior, MD  budesonide-formoterol (SYMBICORT) 160-4.5 MCG/ACT inhaler Inhale 2 puffs into the lungs 2 (two) times daily. 12/01/22   Ghimire, Werner Lean, MD  famotidine (PEPCID) 20 MG tablet Take 1 tablet (20 mg total) by mouth daily. 01/24/23   Elayne Snare K, DO  hyoscyamine (LEVSIN/SL) 0.125 MG SL tablet Place 1 tablet (0.125 mg total) under the tongue every 4 (four) hours as needed (pain). Up to 1.25 mg daily Patient not taking: Reported on 02/22/2023 01/23/23   Arthor Captain, PA-C  metFORMIN (GLUCOPHAGE-XR) 750 MG 24 hr tablet Take 1 tablet (750 mg total) by mouth daily. 02/12/23 03/14/23  Jeannie Fend, PA-C  ondansetron (ZOFRAN) 4 MG tablet Take 1 tablet (4 mg total) by mouth every 6 (six) hours. Patient not taking: Reported on 02/22/2023 01/20/23   Prosperi, Christian H, PA-C  ondansetron (ZOFRAN-ODT) 4 MG disintegrating tablet Take 1 tablet (4 mg total) by mouth every 8 (eight) hours as needed for nausea or vomiting. 01/23/23   Arthor Captain, PA-C  OXYGEN Inhale 4-5 L into the lungs as needed (COPD).    [provider]  sucralfate (CARAFATE) 1 g tablet Take 1 tablet (1 g total) by mouth 4 (four) times daily -  with meals and at bedtime. Patient not  taking: Reported on 02/22/2023 02/11/23   Horton, Mayer Masker, MD  zonisamide (ZONEGRAN) 100 MG capsule Take 2 capsules (200 mg total) by mouth daily. Patient not taking: Reported on 02/22/2023 01/12/23   Lewie Chamber, MD      Allergies    Iron dextran, Aspirin, and Penicillins    Review of Systems   Review of Systems  Respiratory:  Positive for shortness of breath.   Cardiovascular:  Positive for chest pain.    Physical Exam Updated Vital Signs BP 125/80   Pulse (!) 114   Temp 98.2 F (36.8 C) (Oral)   Resp 20   LMP 01/08/2011   SpO2 95%  Physical Exam  ED Results / Procedures / Treatments   Labs (all labs ordered are listed, but only abnormal results are displayed) Labs Reviewed  CBC WITH DIFFERENTIAL/PLATELET  COMPREHENSIVE METABOLIC PANEL  URINALYSIS, ROUTINE W REFLEX MICROSCOPIC  ETHANOL  RAPID URINE DRUG SCREEN, HOSP PERFORMED  BRAIN NATRIURETIC PEPTIDE  TROPONIN I (HIGH SENSITIVITY)    EKG None  Radiology DG THORACOLUMABAR SPINE  Result Date: 02/22/2023 CLINICAL DATA:  Back pain worse with motion and supine positioning EXAM: THORACOLUMBAR SPINE 1V COMPARISON:  08/07/2005, 02/14/2023 FINDINGS: Frontal and lateral views of the thoracolumbar spine are obtained, including T6 through L5. There are 5 non-rib-bearing lumbar type vertebral bodies. Alignment is grossly anatomic. There are stable anterior wedge compression deformities of T6 and T9. No new fractures are  seen from the T6 through L5 levels. Mild spondylosis within the lower thoracic spine unchanged. Diffuse facet hypertrophic changes within the lumbar spine greatest at L4-5 and L5-S1. Paraspinal soft tissues are unremarkable. IMPRESSION: 1. Stable compression deformities at T6 and T9, unchanged since recent CT 02/14/2023. 2. Multilevel spondylosis and facet hypertrophy. 3. No new fractures. Electronically Signed   By: Sharlet Salina M.D.   On: 02/22/2023 17:09   DG Chest Port 1 View  Result Date:  02/22/2023 CLINICAL DATA:  chest pain and palpitations. EXAM: PORTABLE CHEST 1 VIEW COMPARISON:  02/20/2021 FINDINGS: Heart size and mediastinal contours are unremarkable. No pleural fluid or interstitial edema. New patchy opacities within the perihilar left mid lung, left lower lung and right base identified. Visualized osseous structures are unremarkable. IMPRESSION: New patchy opacities within the perihilar left mid lung, left lower lung and right base. Findings may reflect areas of atelectasis or pneumonia. Electronically Signed   By: Signa Kell M.D.   On: 02/22/2023 06:48    Procedures Procedures  {Document cardiac monitor, telemetry assessment procedure when appropriate:1}  Medications Ordered in ED Medications - No data to display  ED Course/ Medical Decision Making/ A&P   {   Click here for ABCD2, HEART and other calculatorsREFRESH Note before signing :1}                              Medical Decision Making Amount and/or Complexity of Data Reviewed Labs: ordered.   ***  {Document critical care time when appropriate:1} {Document review of labs and clinical decision tools ie heart score, Chads2Vasc2 etc:1}  {Document your independent review of radiology images, and any outside records:1} {Document your discussion with family members, caretakers, and with consultants:1} {Document social determinants of health affecting pt's care:1} {Document your decision making why or why not admission, treatments were needed:1} Final Clinical Impression(s) / ED Diagnoses Final diagnoses:  None    Rx / DC Orders ED Discharge Orders     None

## 2023-02-23 NOTE — Significant Event (Addendum)
Called by RN: pt leaving AMA, is calling daughter to come pick her up.  Unable to convince her to stay.  Has removed IV and refusing tele leads at this time.  Tried to convince patient to stay, noting her recent A.Fib RVR (though thankfully she had converted out of this overnight for the moment), and cardiac issues.  Unfortunately I was unable to convince her to stay for further medical care.  She understands risks up to and including death or permanent disability.  I spoke with patient as she was already walking out the door of the unit, pt asking about a cab voucher, I suggested ED front desk may know more about this but unfortunately I dont know how to get these at night.  I suggested she remain in hospital / stay  at least until she could get a-hold of her daughter and secure a ride home.  Patient refused and ambulated off unit.

## 2023-02-23 NOTE — Discharge Summary (Signed)
[facility] Physician Discharge Summary  [name] WJX:[id number] DOB: May 16, 1966 DOA: 02/22/2023  PCP: Fleet Contras, MD  Admit date: 02/22/2023 Discharge date: 02/23/2023  Admitted From: Home Disposition: Left AGAINST MEDICAL ADVICE  History of present illness:  [name] is a 57 y.o. female with past medical history significant for COPD, DM2, chronic alcohol use, schizophrenia who presented to [facility] ED multiple times with complaints of palpitations.  Earlier in the day she was seen; found to have A-fib with RVR but refused care and left; returned once again in the evening.  Patient also endorsed shortness of breath.  She continues to report drinking alcohol on a regular basis, last drink reported 5 PM on 02/21/2023.  Denies cough, no sputum production, no fever/chills.   In the ED, temperature 97.9 F, HR 164, RR 21, BP 119/70, SpO2 100% on 3 L nasal cannula.  WBC 6.9, hemoglobin 8.2, platelet count 276.  Sodium 127, potassium 4.8, chloride 100, CO2 13, glucose 349, BUN 8, creatinine 0.65.  EtOH level 128.  UDS negative.  Chest x-ray with new patchy opacities perihilar left midlung, left lower lung and right base consistent with atelectasis versus pneumonia.  EKG with A-fib with RVR, rate 175.  Patient received Cardizem 15 mg IV x 1 and placed on a Cardizem drip.  TRH consulted for admission for further evaluation and management of A-fib with RVR, acute alcohol intoxication.  Hospital course:  Atrial fibrillation with RVR Patient presenting to ED with palpitations.  Patient was afebrile without leukocytosis.  Heart rate in the 160s-170s.  EKG confirming A-fib with RVR.  Complicated by excessive alcohol use.  TSH within normal limits.  UDS negative.  CHA2DS2-VASc score = 3.  Cardiology was consulted and followed during hospital course.  Patient was initially started on heparin drip and Cardizem drip.  Although not a chronic anticoagulation candidate given her severe alcohol use  disorder.  Patient decided to leave AMA.  Covering nocturnist attempted to convince her to stay for further care but she was adamant about leaving and understood risks including death or permanent disability.   Acute alcohol intoxication Chronic alcohol use disorder None anion gap metabolic acidosis Patient endorses drinking 240 ounce beers daily.  Denies any history of severe withdrawal symptoms or seizures.  EtOH level 128 on admission.  Counseled on need for complete cessation/abstinence.  Placed on CIWA protocol and no significant withdrawal symptoms noted during hospitalization.   Community-acquired pneumonia Chest x-ray with new patchy opacities perihilar left midlung, left lower lung and right base consistent with atelectasis versus pneumonia.  Denies cough, afebrile without leukocytosis.  Patient was started on antibiotics with doxycycline and ceftriaxone.  Left AMA.   Hyponatremia Sodium 127 on admission.  Baseline roughly 133.  Etiology likely secondary to beer potomania in the setting of excessive alcohol use.  TSH 0.893.  Serum osmolality 302, urine sodium less than 10.  Patient was brought IV fluid hydration with improvement of sodium to 130 at time of discharge.   Back pain Patient reports recent onset of low back pain.  Unsure if related to fall.  X-ray T/L-spine with stable compression deformities T6 and T9 unchanged since recent CT 02/14/2023, no new fractures.   COPD Patient reports not oxygen dependent at baseline; but home medication list oxygen 4-5 L as needed.  Reports that she ran out of her inhalers.   Type 2 diabetes mellitus, with hyperglycemia Recent hemoglobin A1c 7.3 on 01/11/2023.  Metformin listed on her home medication list  but reports she is not taking.   History of mood disorder/schizophrenia Currently not on any medication outpatient.  Outpatient follow-up with behavioral health   Tobacco use disorder Counseled on need for complete  cessation/abstinence  Discharge Diagnoses:  Principal Problem:   Paroxysmal atrial fibrillation with RVR (HCC) Active Problems:   COPD (chronic obstructive pulmonary disease) (HCC)   CAP (community acquired pneumonia)   Hyponatremia   Chronic alcohol use   Continuous dependence on cigarette smoking    Discharge Instructions  Discharge Instructions     Amb referral to [facility]   Complete by: As directed         Allergies  Allergen Reactions   Iron Dextran Shortness Of Breath and Anxiety   Aspirin Nausea And Vomiting and Other (See Comments)    Ok to take tylenol or ibuprofen    Penicillins Other (See Comments)    Unknown. Childhood reaction     Consultations: Cardiology   Procedures/Studies: DG THORACOLUMABAR SPINE  Result Date: 02/22/2023 CLINICAL DATA:  Back pain worse with motion and supine positioning EXAM: THORACOLUMBAR SPINE 1V COMPARISON:  08/07/2005, 02/14/2023 FINDINGS: Frontal and lateral views of the thoracolumbar spine are obtained, including T6 through L5. There are 5 non-rib-bearing lumbar type vertebral bodies. Alignment is grossly anatomic. There are stable anterior wedge compression deformities of T6 and T9. No new fractures are seen from the T6 through L5 levels. Mild spondylosis within the lower thoracic spine unchanged. Diffuse facet hypertrophic changes within the lumbar spine greatest at L4-5 and L5-S1. Paraspinal soft tissues are unremarkable. IMPRESSION: 1. Stable compression deformities at T6 and T9, unchanged since recent CT 02/14/2023. 2. Multilevel spondylosis and facet hypertrophy. 3. No new fractures. Electronically Signed   By: Sharlet Salina M.D.   On: 02/22/2023 17:09   DG Chest Port 1 View  Result Date: 02/22/2023 CLINICAL DATA:  chest pain and palpitations. EXAM: PORTABLE CHEST 1 VIEW COMPARISON:  02/20/2021 FINDINGS: Heart size and mediastinal contours are unremarkable. No pleural fluid or interstitial edema. New patchy opacities  within the perihilar left mid lung, left lower lung and right base identified. Visualized osseous structures are unremarkable. IMPRESSION: New patchy opacities within the perihilar left mid lung, left lower lung and right base. Findings may reflect areas of atelectasis or pneumonia. Electronically Signed   By: Signa Kell M.D.   On: 02/22/2023 06:48   DG Chest Portable 1 View  Result Date: 02/21/2023 CLINICAL DATA:  Shortness of breath.  Discharged earlier today. EXAM: PORTABLE CHEST 1 VIEW COMPARISON:  02/15/2023 FINDINGS: Numerous leads and wires project over the chest. Patient rotated minimally right. Midline trachea. Normal heart size. No pleural effusion or pneumothorax. Mild lower lung predominant interstitial thickening is likely related to smoking/chronic bronchitis. IMPRESSION: No acute cardiopulmonary disease. Electronically Signed   By: Jeronimo Greaves M.D.   On: 02/21/2023 18:54   DG Chest Port 1 View  Result Date: 02/15/2023 CLINICAL DATA:  Shortness of breath. EXAM: PORTABLE CHEST 1 VIEW COMPARISON:  CTA chest yesterday at 6:32 a.m., portable chest yesterday at 4:21 a.m. FINDINGS: There is a relatively low inspiration on exam. Interstitial haziness in the lung bases could be due to low lung volumes or early pneumonitis. The sulci are sharp. There is mild cardiomegaly without evidence of CHF. The mediastinum is normally outlined. Thoracic spondylosis. IMPRESSION: 1. Interstitial haziness in the lung bases, could be due to low lung volumes or early pneumonitis. Follow-up study recommended in full inspiration. 2. Mild cardiomegaly without evidence of CHF. Electronically  Signed   By: Almira Bar M.D.   On: 02/15/2023 01:42   CT Angio Chest Pulmonary Embolism (PE) W or WO Contrast  Result Date: 02/14/2023 CLINICAL DATA:  Pulmonary embolism suspected. High probability. New opacity noted today in the peripheral left lung base on chest radiograph. EXAM: CT ANGIOGRAPHY CHEST WITH CONTRAST  TECHNIQUE: Multidetector CT imaging of the chest was performed using the standard protocol during bolus administration of intravenous contrast. Multiplanar CT image reconstructions and MIPs were obtained to evaluate the vascular anatomy. RADIATION DOSE REDUCTION: This exam was performed according to the departmental dose-optimization program which includes automated exposure control, adjustment of the mA and/or kV according to patient size and/or use of iterative reconstruction technique. CONTRAST:  75mL OMNIPAQUE IOHEXOL 350 MG/ML SOLN COMPARISON:  AP Lat chest today, AP Lat chest 02/11/2023, AP Lat chest 02/10/2023, CTA chest 12/16/2022 and CTA chest 12/05/2022. FINDINGS: Cardiovascular: There is mild cardiomegaly, scattered calcific plaque LAD and right coronary arteries. No substantial pericardial effusion. Pulmonary veins are normal caliber. No arterial embolism is seen. The pulmonary trunk again measures prominent at 3.4 cm indicating arterial hypertension. There is no evidence of acute right heart strain. There is mild aortic atherosclerosis without aneurysm, dissection or stenosis. The great vessels are clear, with innominate trunk tortuosity. Mediastinum/Nodes: No enlarged mediastinal, hilar, or axillary lymph nodes. Thyroid gland, trachea, and esophagus demonstrate no significant findings. Small hiatal hernia. Lungs/Pleura: There previously were small coarsely nodular opacities in the superior segment of the left lower lobe cough which have resolved, and presumably were inflammatory nodules. There is mild posterior atelectasis both lung bases. There is no lung infiltrate corresponding to the hazy opacity seen in the lateral left base on the chest x-ray earlier today. I believe this was simple chest wall attenuation. There is diffuse bronchial thickening. Scattered subsegmental lower lobe bronchial plugging. There is mild paraseptal emphysema in the upper lobes. There is no focal pneumonic infiltrate or  nodule today. No pleural effusion, thickening or pneumothorax. 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  No acute findings. Musculoskeletal: Generalized osteopenia. There is a mild increased bow tie configuration of the T4 vertebral body with a biconcave appearance of the endplates compared to the prior studies. Loss of central vertebral height is about 25% today but no significant anterior or posterior height loss is seen. This is age-indeterminate but was not seen on 12/16/2022. There is also a mild increased new wedge compression fracture along the upper plate of T6, with loss of anterior height about 20% without retropulsion and with fragmentation along the mid to anterior superior endplate. There are no change in moderate chronic T9 compression fracture. Remaining vertebra are normal in heights. The ribcage is intact. Review of the MIP images confirms the above findings. IMPRESSION: 1. No evidence of arterial embolus. 2. Chronically prominent pulmonary trunk indicating arterial hypertension. No evidence of acute right heart strain. 3. Cardiomegaly with calcific CAD and aortic atherosclerosis. 4. COPD with diffuse bronchial thickening and scattered subsegmental lower lobe bronchial plugging. No focal infiltrate. 5. No lung infiltrate corresponding to the hazy opacity seen in the lateral left base on the chest x-ray earlier today. I believe this was simple chest wall attenuation. 6. Mild increased T4 central compression fracture with biconcave endplates, and mild increased T6 upper endplate wedge compression fracture. These are age-indeterminate but new from 12/16/2022. 7. Small hiatal hernia. 8. 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 9. Previously noted coarsely nodular opacities in the superior segment of the left lower lobe have resolved, presumably were inflammatory nodules.  Aortic Atherosclerosis (ICD10-I70.0) and Emphysema (ICD10-J43.9). Electronically Signed   By: Almira Bar M.D.   On: 02/14/2023 07:07   DG Chest 2  View  Result Date: 02/14/2023 CLINICAL DATA:  57 year old female with history of shortness of breath. Back pain. EXAM: CHEST - 2 VIEW COMPARISON:  Chest x-ray 02/11/2023. FINDINGS: Ill-defined opacity in the lateral aspect of the left lung base obscuring the lateral left costophrenic sulcus. Right lung is clear. No right pleural effusion. No pneumothorax. No evidence of pulmonary edema. Heart size is normal. Upper mediastinal contours are within normal limits. IMPRESSION: 1. New opacity in the periphery of the left lung base. In the appropriate clinical setting this may simply represent airspace consolidation from pneumonia, however, pulmonary infarct can have a similar appearance. If there is clinical concern for acute pulmonary infarction, further evaluation with PE protocol CT scan should be considered. At the very least, followup PA and lateral chest X-ray is recommended in 3-4 weeks following trial of antibiotic therapy to ensure resolution and exclude underlying malignancy. Electronically Signed   By: Trudie Reed M.D.   On: 02/14/2023 05:26   DG Chest 2 View  Result Date: 02/11/2023 CLINICAL DATA:  Shortness of breath and wheezing. EXAM: CHEST - 2 VIEW COMPARISON:  PA Lat yesterday at 3:05 a.m. FINDINGS: The lungs are clear. Heart size and vasculature are normal. The mediastinum is normally outlined. There is mild calcific plaque of the transverse aorta. There is osteopenia with thoracic kyphosis and chronic compression fractures of the T6 and T9 vertebral bodies. IMPRESSION: 1. No active cardiopulmonary disease. 2. Chronic compression fractures of the T6 and T9 vertebral bodies. Osteopenia. 3. Stable chest. Electronically Signed   By: Almira Bar M.D.   On: 02/11/2023 03:05   DG Chest 2 View  Result Date: 02/10/2023 CLINICAL DATA:  Shortness of breath EXAM: CHEST - 2 VIEW COMPARISON:  Radiograph 01/25/2023 FINDINGS: Stable cardiomediastinal silhouette. No focal consolidation, pleural  effusion, or pneumothorax. No displaced rib fractures. Chronic compression fracture of T6 and T9. IMPRESSION: No active cardiopulmonary disease. Electronically Signed   By: Minerva Fester M.D.   On: 02/10/2023 03:35   DG Abdomen 1 View  Result Date: 01/29/2023 CLINICAL DATA:  Abdominal pain for a few days, worsened. obtain upright or decub to assess for sbo EXAM: ABDOMEN - 1 VIEW COMPARISON:  CT 01/22/2023 FINDINGS: View obtained standing. Few air-fluid levels within bowel in the central abdomen, likely colon. No significant formed stool. No evidence of free intra-abdominal air, although the diaphragms are not included in the field of view. 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 Clothing artifact over the pelvis. Presumed lighter projects over the low left groin. IMPRESSION: Few air-fluid levels within bowel in the central abdomen, likely colon, nonspecific. No free air or bowel obstruction on this single view. Electronically Signed   By: Narda Rutherford M.D.   On: 01/29/2023 17:35   DG Chest Portable 1 View  Result Date: 01/25/2023 CLINICAL DATA:  Shortness of breath. EXAM: PORTABLE CHEST 1 VIEW COMPARISON:  01/11/2023. FINDINGS: The heart is mildly enlarged and the mediastinal contour is within normal limits. No consolidation, effusion, or pneumothorax. No acute osseous abnormality. IMPRESSION: No active disease. Electronically Signed   By: Thornell Sartorius M.D.   On: 01/25/2023 04:16     Subjective: Patient left AMA overnight  Discharge Exam: Vitals:   02/23/23 0258 02/23/23 0400  BP: 116/71 112/72  Pulse: 92 93  Resp: 20 (!) 21  Temp: 98.3 F (36.8 C)  SpO2: 100% 100%   Vitals:   02/23/23 0000 02/23/23 0258 02/23/23 0400 02/23/23 0451  BP: (!) 115/59 116/71 112/72   Pulse: 89 92 93   Resp: 18 20 (!) 21   Temp:  98.3 F (36.8 C)    TempSrc:  Oral    SpO2: 100% 100% 100%   Weight:    56.1 kg  Height:           The results of significant diagnostics from this  hospitalization (including imaging, microbiology, ancillary and laboratory) are listed below for reference.     Microbiology: Recent Results (from the past 240 hour(s))  MRSA Next Gen by PCR, Nasal     Status: None   Collection Time: 02/22/23  8:35 AM   Specimen: Nasal Mucosa; Nasal Swab  Result Value Ref Range Status   MRSA by PCR Next Gen NOT DETECTED NOT DETECTED Final    Comment: (NOTE) The GeneXpert MRSA Assay (FDA approved for NASAL specimens only), is one component of a comprehensive MRSA colonization surveillance program. It is not intended to diagnose MRSA infection nor to guide or monitor treatment for MRSA infections. Test performance is not FDA approved in patients less than 61 years old. Performed at [facility], [address] [place], [place] [address]      Labs: BNP (last 3 results) Recent Labs    11/20/22 1039 11/28/22 0645 02/10/23 0444  BNP 84.2 63.9 79.5   Basic Metabolic Panel: Recent Labs  Lab 02/21/23 1716 02/22/23 0515 02/22/23 0801 02/22/23 1755  NA 133* 127* 132* 130*  K 3.9 4.8 4.2 4.1  CL 104 100 104 103  CO2 18* 13* 14* 16*  GLUCOSE 181* 349* 310* 212*  BUN 7 8 7 8   CREATININE 0.46 0.65 0.62 0.82  CALCIUM 8.5* 8.1* 8.2* 8.0*  MG  --  2.1  --   --    Liver Function Tests: Recent Labs  Lab 02/22/23 0801  AST 17  ALT 17  ALKPHOS 96  BILITOT 0.3  PROT 6.2*  ALBUMIN 3.0*   No results for input(s): "LIPASE", "AMYLASE" in the last 168 hours. No results for input(s): "AMMONIA" in the last 168 hours. CBC: Recent Labs  Lab 02/21/23 1716 02/22/23 0515 02/22/23 0801  WBC 6.9 6.9 7.7  NEUTROABS  --   --  6.7  HGB 10.1* 8.2* 8.7*  HCT 33.6* 27.4* 30.3*  MCV 78.5* 80.4 83.7  PLT 328 276 PLATELET CLUMPS NOTED ON SMEAR, UNABLE TO ESTIMATE   Cardiac Enzymes: No results for input(s): "CKTOTAL", "CKMB", "CKMBINDEX", "TROPONINI" in the last 168 hours. BNP: Invalid input(s): "POCBNP" CBG: Recent Labs  Lab 02/22/23 1000  02/22/23 1200 02/22/23 1610 02/22/23 2051  GLUCAP 166* 85 121* 146*   D-Dimer No results for input(s): "DDIMER" in the last 72 hours. Hgb A1c No results for input(s): "HGBA1C" in the last 72 hours. Lipid Profile No results for input(s): "CHOL", "HDL", "LDLCALC", "TRIG", "CHOLHDL", "LDLDIRECT" in the last 72 hours. Thyroid function studies Recent Labs    02/22/23 0806  TSH 0.893   Anemia work up No results for input(s): "VITAMINB12", "FOLATE", "FERRITIN", "TIBC", "IRON", "RETICCTPCT" in the last 72 hours. Urinalysis    Component Value Date/Time   COLORURINE STRAW (A) 02/12/2023 0350   APPEARANCEUR CLOUDY (A) 02/12/2023 0350   LABSPEC 1.013 02/12/2023 0350   PHURINE 5.0 02/12/2023 0350   GLUCOSEU 150 (A) 02/12/2023 0350   HGBUR NEGATIVE 02/12/2023 0350   BILIRUBINUR NEGATIVE 02/12/2023 0350   Lavenia Atlas  NEGATIVE 02/12/2023 0350   PROTEINUR NEGATIVE 02/12/2023 0350   UROBILINOGEN 0.2 09/04/2013 0426   NITRITE NEGATIVE 02/12/2023 0350   LEUKOCYTESUR NEGATIVE 02/12/2023 0350   Sepsis Labs Recent Labs  Lab 02/21/23 1716 02/22/23 0515 02/22/23 0801  WBC 6.9 6.9 7.7   Microbiology Recent Results (from the past 240 hour(s))  MRSA Next Gen by PCR, Nasal     Status: None   Collection Time: 02/22/23  8:35 AM   Specimen: Nasal Mucosa; Nasal Swab  Result Value Ref Range Status   MRSA by PCR Next Gen NOT DETECTED NOT DETECTED Final    Comment: (NOTE) The GeneXpert MRSA Assay (FDA approved for NASAL specimens only), is one component of a comprehensive MRSA colonization surveillance program. It is not intended to diagnose MRSA infection nor to guide or monitor treatment for MRSA infections. Test performance is not FDA approved in patients less than 42 years old. Performed at [facility], [address], [place], [place] [address]      Time coordinating discharge: Over 30 minutes  SIGNED:   Alvira Philips [place], DO  [facility] 02/23/2023, 6:36 AM

## 2023-02-24 ENCOUNTER — Encounter (HOSPITAL_COMMUNITY): Payer: Self-pay | Admitting: Emergency Medicine

## 2023-02-24 ENCOUNTER — Encounter (HOSPITAL_COMMUNITY): Payer: Self-pay | Admitting: *Deleted

## 2023-02-24 ENCOUNTER — Emergency Department (HOSPITAL_COMMUNITY)
Admission: EM | Admit: 2023-02-24 | Discharge: 2023-02-25 | Disposition: A | Discharge status: Home / Self Care | Payer: 59 | Source: Home / Self Care | Attending: Emergency Medicine | Admitting: Emergency Medicine

## 2023-02-24 ENCOUNTER — Other Ambulatory Visit: Payer: Self-pay

## 2023-02-24 ENCOUNTER — Emergency Department (HOSPITAL_COMMUNITY)
Admission: EM | Admit: 2023-02-24 | Discharge: 2023-02-24 | Disposition: A | Discharge status: Home / Self Care | Payer: 59 | Source: Home / Self Care | Attending: Emergency Medicine | Admitting: Emergency Medicine

## 2023-02-24 DIAGNOSIS — I499 Cardiac arrhythmia, unspecified: Secondary | ICD-10-CM | POA: Diagnosis not present

## 2023-02-24 DIAGNOSIS — J441 Chronic obstructive pulmonary disease with (acute) exacerbation: Secondary | ICD-10-CM | POA: Insufficient documentation

## 2023-02-24 DIAGNOSIS — I10 Essential (primary) hypertension: Secondary | ICD-10-CM | POA: Insufficient documentation

## 2023-02-24 DIAGNOSIS — E119 Type 2 diabetes mellitus without complications: Secondary | ICD-10-CM | POA: Insufficient documentation

## 2023-02-24 DIAGNOSIS — G8929 Other chronic pain: Secondary | ICD-10-CM

## 2023-02-24 DIAGNOSIS — M545 Low back pain, unspecified: Secondary | ICD-10-CM | POA: Diagnosis not present

## 2023-02-24 DIAGNOSIS — F17210 Nicotine dependence, cigarettes, uncomplicated: Secondary | ICD-10-CM | POA: Insufficient documentation

## 2023-02-24 DIAGNOSIS — E039 Hypothyroidism, unspecified: Secondary | ICD-10-CM | POA: Insufficient documentation

## 2023-02-24 DIAGNOSIS — R0602 Shortness of breath: Secondary | ICD-10-CM | POA: Insufficient documentation

## 2023-02-24 DIAGNOSIS — J449 Chronic obstructive pulmonary disease, unspecified: Secondary | ICD-10-CM | POA: Insufficient documentation

## 2023-02-24 DIAGNOSIS — M546 Pain in thoracic spine: Secondary | ICD-10-CM | POA: Insufficient documentation

## 2023-02-24 DIAGNOSIS — Z743 Need for continuous supervision: Secondary | ICD-10-CM | POA: Diagnosis not present

## 2023-02-24 DIAGNOSIS — R404 Transient alteration of awareness: Secondary | ICD-10-CM | POA: Diagnosis not present

## 2023-02-24 DIAGNOSIS — R000 Tachycardia, unspecified: Secondary | ICD-10-CM | POA: Insufficient documentation

## 2023-02-24 DIAGNOSIS — M549 Dorsalgia, unspecified: Secondary | ICD-10-CM | POA: Diagnosis not present

## 2023-02-24 LAB — URINALYSIS, ROUTINE W REFLEX MICROSCOPIC
Bilirubin Urine: NEGATIVE
Glucose, UA: NEGATIVE mg/dL
Hgb urine dipstick: NEGATIVE
Ketones, ur: NEGATIVE mg/dL
Leukocytes,Ua: NEGATIVE
Nitrite: NEGATIVE
Protein, ur: NEGATIVE mg/dL
Specific Gravity, Urine: 1.005 (ref 1.005–1.030)
pH: 6 (ref 5.0–8.0)

## 2023-02-24 LAB — RAPID URINE DRUG SCREEN, HOSP PERFORMED
Amphetamines: NOT DETECTED
Barbiturates: NOT DETECTED
Benzodiazepines: POSITIVE — AB
Cocaine: NOT DETECTED
Opiates: NOT DETECTED
Tetrahydrocannabinol: NOT DETECTED

## 2023-02-24 LAB — LEGIONELLA PNEUMOPHILA SEROGP 1 UR AG: L. pneumophila Serogp 1 Ur Ag: NEGATIVE

## 2023-02-24 LAB — TROPONIN I (HIGH SENSITIVITY): Troponin I (High Sensitivity): 6 ng/L (ref <18)

## 2023-02-24 MED ORDER — ALBUTEROL SULFATE HFA 108 (90 BASE) MCG/ACT IN AERS
2.0000 | INHALATION_SPRAY | RESPIRATORY_TRACT | Status: DC | PRN
  Administered 2023-02-24: 2 via RESPIRATORY_TRACT
  Filled 2023-02-24: qty 6.7

## 2023-02-24 MED ORDER — METHYLPREDNISOLONE SODIUM SUCC 125 MG IJ SOLR
125.0000 mg | INTRAMUSCULAR | Status: AC
  Administered 2023-02-24: 125 mg via INTRAVENOUS
  Filled 2023-02-24: qty 2

## 2023-02-24 MED ORDER — MAGNESIUM SULFATE 2 GM/50ML IV SOLN
2.0000 g | Freq: Once | INTRAVENOUS | Status: AC
  Administered 2023-02-24: 2 g via INTRAVENOUS
  Filled 2023-02-24: qty 50

## 2023-02-24 MED ORDER — PREDNISONE 20 MG PO TABS: 40.0000 mg | ORAL_TABLET | Freq: Every day | ORAL | 0 refills | Status: AC

## 2023-02-24 MED ORDER — FUROSEMIDE 20 MG PO TABS: 20.0000 mg | ORAL_TABLET | Freq: Every day | ORAL | 0 refills | Status: DC

## 2023-02-24 MED ORDER — PROCHLORPERAZINE EDISYLATE 10 MG/2ML IJ SOLN
10.0000 mg | INTRAMUSCULAR | Status: AC
  Administered 2023-02-24: 10 mg via INTRAMUSCULAR
  Filled 2023-02-24: qty 2

## 2023-02-24 MED ORDER — LACTATED RINGERS IV BOLUS
500.0000 mL | Freq: Once | INTRAVENOUS | Status: AC
  Administered 2023-02-24: 500 mL via INTRAVENOUS

## 2023-02-24 MED ORDER — ACETAMINOPHEN 500 MG PO TABS
1000.0000 mg | ORAL_TABLET | Freq: Once | ORAL | Status: AC
  Administered 2023-02-24: 1000 mg via ORAL
  Filled 2023-02-24: qty 2

## 2023-02-24 NOTE — ED Triage Notes (Signed)
Patient from home w/ complaints of SHOB, patient w/ hx of COPD supposed to be on 2L chronically however noncompliant. Complains of her lower back bothering her today all day today.   BP 160/80 HR 120 EKG unremarkable

## 2023-02-24 NOTE — ED Triage Notes (Signed)
Pt arrives ambulatory to triage, stating upper back pain and she can't breath. Pt just d/c from ED, was waiting in lobby for ride. No acute distress.

## 2023-02-24 NOTE — ED Provider Notes (Signed)
Angiocath insertion Performed by: Gilda Crease  Consent: Verbal consent obtained. Risks and benefits: risks, benefits and alternatives were discussed Time out: Immediately prior to procedure a "time out" was called to verify the correct patient, procedure, equipment, support staff and site/side marked as required.  Preparation: Patient was prepped and draped in the usual sterile fashion.  Vein Location: R upper arm Ultrasound Guided Gauge: 20  Normal blood return and flush without difficulty Patient tolerance: Patient tolerated the procedure well with no immediate complications.     Gilda Crease, MD 02/24/23 [phone]

## 2023-02-24 NOTE — ED Notes (Signed)
Patient verbalizes understanding of discharge instructions. Opportunity for questioning and answers were provided. Armband removed by staff, pt discharged from ED. Pt taken to ED waiting room via wheelchair to wait for daughter.

## 2023-02-24 NOTE — ED Provider Notes (Signed)
[place] EMERGENCY DEPARTMENT AT [facility] Provider Note   CSN: [id number] Arrival date & time: 02/24/23  2952     History  Chief Complaint  Patient presents with   Back Pain    [name] is a 57 y.o. female.  Patient was just discharged from this emergency department.  She has not even left the building, checks back in stating that her back hurts.  She has a history of COPD and feels like she is wheezing.       Home Medications Prior to Admission medications   Medication Sig Start Date End Date Taking? Authorizing Provider  albuterol (VENTOLIN HFA) 108 (90 Base) MCG/ACT inhaler Inhale 1-2 puffs into the lungs every 6 (six) hours as needed for wheezing or shortness of breath. 02/10/23   Rancour, Jeannett Senior, MD  budesonide-formoterol (SYMBICORT) 160-4.5 MCG/ACT inhaler Inhale 2 puffs into the lungs 2 (two) times daily. 12/01/22   Ghimire, Werner Lean, MD  famotidine (PEPCID) 20 MG tablet Take 1 tablet (20 mg total) by mouth daily. 01/24/23   Elayne Snare K, DO  furosemide (LASIX) 20 MG tablet Take 1 tablet (20 mg total) by mouth daily. 02/24/23   Achille Rich, PA-C  hyoscyamine (LEVSIN/SL) 0.125 MG SL tablet Place 1 tablet (0.125 mg total) under the tongue every 4 (four) hours as needed (pain). Up to 1.25 mg daily Patient not taking: Reported on 02/22/2023 01/23/23   Arthor Captain, PA-C  metFORMIN (GLUCOPHAGE-XR) 750 MG 24 hr tablet Take 1 tablet (750 mg total) by mouth daily. 02/12/23 03/14/23  Jeannie Fend, PA-C  ondansetron (ZOFRAN) 4 MG tablet Take 1 tablet (4 mg total) by mouth every 6 (six) hours. Patient not taking: Reported on 02/22/2023 01/20/23   Prosperi, Christian H, PA-C  ondansetron (ZOFRAN-ODT) 4 MG disintegrating tablet Take 1 tablet (4 mg total) by mouth every 8 (eight) hours as needed for nausea or vomiting. 01/23/23   Arthor Captain, PA-C  OXYGEN Inhale 4-5 L into the lungs as needed (COPD).    [provider]  predniSONE (DELTASONE) 20 MG  tablet Take 2 tablets (40 mg total) by mouth daily for 5 days. 02/24/23 03/01/23  Achille Rich, PA-C  sucralfate (CARAFATE) 1 g tablet Take 1 tablet (1 g total) by mouth 4 (four) times daily -  with meals and at bedtime. Patient not taking: Reported on 02/22/2023 02/11/23   Horton, Mayer Masker, MD  zonisamide (ZONEGRAN) 100 MG capsule Take 2 capsules (200 mg total) by mouth daily. Patient not taking: Reported on 02/22/2023 01/12/23   Lewie Chamber, MD      Allergies    Iron dextran, Aspirin, and Penicillins    Review of Systems   Review of Systems  Physical Exam Updated Vital Signs BP 123/85 (BP Location: Right Arm)   Pulse (!) 116   Temp 98.5 F (36.9 C)   Resp 20   LMP 01/08/2011   SpO2 92%  Physical Exam Vitals and nursing note reviewed.  Constitutional:      General: She is not in acute distress.    Appearance: She is well-developed.  HENT:     Head: Normocephalic and atraumatic.     Mouth/Throat:     Mouth: Mucous membranes are moist.  Eyes:     General: Vision grossly intact. Gaze aligned appropriately.     Extraocular Movements: Extraocular movements intact.     Conjunctiva/sclera: Conjunctivae normal.  Cardiovascular:     Rate and Rhythm: Normal rate and regular rhythm.  Pulses: Normal pulses.     Heart sounds: Normal heart sounds, S1 normal and S2 normal. No murmur heard.    No friction rub. No gallop.  Pulmonary:     Effort: Pulmonary effort is normal. No respiratory distress.     Breath sounds: Normal breath sounds.  Abdominal:     General: Bowel sounds are normal.     Palpations: Abdomen is soft.     Tenderness: There is no abdominal tenderness. There is no guarding or rebound.     Hernia: No hernia is present.  Musculoskeletal:        General: No swelling.     Cervical back: Full passive range of motion without pain, normal range of motion and neck supple. No spinous process tenderness or muscular tenderness. Normal range of motion.     Right lower leg: No  edema.     Left lower leg: No edema.  Skin:    General: Skin is warm and dry.     Capillary Refill: Capillary refill takes less than 2 seconds.     Findings: No ecchymosis, erythema, rash or wound.  Neurological:     General: No focal deficit present.     Mental Status: She is alert and oriented to person, place, and time.     GCS: GCS eye subscore is 4. GCS verbal subscore is 5. GCS motor subscore is 6.     Cranial Nerves: Cranial nerves 2-12 are intact.     Sensory: Sensation is intact.     Motor: Motor function is intact.     Coordination: Coordination is intact.  Psychiatric:        Attention and Perception: Attention normal.        Mood and Affect: Mood normal.        Speech: Speech normal.        Behavior: Behavior normal.     ED Results / Procedures / Treatments   Labs (all labs ordered are listed, but only abnormal results are displayed) Labs Reviewed - No data to display  EKG None  Radiology DG Chest Portable 1 View  Result Date: 02/23/2023 CLINICAL DATA:  Chest pain. EXAM: PORTABLE CHEST 1 VIEW COMPARISON:  Radiograph yesterday FINDINGS: Lower lung volumes from yesterday. Improving patchy bilateral airspace opacities, mild residual in the left mid lower lung zone. No new or progressive consolidation. No pulmonary edema, pleural effusion or pneumothorax. Stable osseous structures. IMPRESSION: Lower lung volumes from yesterday. Improving patchy bilateral airspace opacities, mild residual in the left mid lower lung zone. Electronically Signed   By: Narda Rutherford M.D.   On: 02/23/2023 22:21   DG THORACOLUMABAR SPINE  Result Date: 02/22/2023 CLINICAL DATA:  Back pain worse with motion and supine positioning EXAM: THORACOLUMBAR SPINE 1V COMPARISON:  08/07/2005, 02/14/2023 FINDINGS: Frontal and lateral views of the thoracolumbar spine are obtained, including T6 through L5. There are 5 non-rib-bearing lumbar type vertebral bodies. Alignment is grossly anatomic. There are  stable anterior wedge compression deformities of T6 and T9. No new fractures are seen from the T6 through L5 levels. Mild spondylosis within the lower thoracic spine unchanged. Diffuse facet hypertrophic changes within the lumbar spine greatest at L4-5 and L5-S1. Paraspinal soft tissues are unremarkable. IMPRESSION: 1. Stable compression deformities at T6 and T9, unchanged since recent CT 02/14/2023. 2. Multilevel spondylosis and facet hypertrophy. 3. No new fractures. Electronically Signed   By: Sharlet Salina M.D.   On: 02/22/2023 17:09   DG Chest [facility] 1 View  Result  Date: 02/22/2023 CLINICAL DATA:  chest pain and palpitations. EXAM: PORTABLE CHEST 1 VIEW COMPARISON:  02/20/2021 FINDINGS: Heart size and mediastinal contours are unremarkable. No pleural fluid or interstitial edema. New patchy opacities within the perihilar left mid lung, left lower lung and right base identified. Visualized osseous structures are unremarkable. IMPRESSION: New patchy opacities within the perihilar left mid lung, left lower lung and right base. Findings may reflect areas of atelectasis or pneumonia. Electronically Signed   By: Signa Kell M.D.   On: 02/22/2023 06:48    Procedures Procedures    Medications Ordered in ED Medications - No data to display  ED Course/ Medical Decision Making/ A&P                                 Medical Decision Making  Reviewed workup from prior ED visit.  Patient treated with Lasix and bronchodilator therapy.  The only abnormality noted was sinus tachycardia.  A thorough review of all of her previous visits reveals that her heart rate generally runs between 100 and 120.  No significant wheezing currently.  Will provide her with an inhaler.  Does not require further workup.  In addition to today's workup, patient has been in the ED in each of the last few days and has had a thorough workup that includes cardiac evaluation and CT angiography to rule out PE.        Final  Clinical Impression(s) / ED Diagnoses Final diagnoses:  Chronic obstructive pulmonary disease with acute exacerbation [facility])    Rx / DC Orders ED Discharge Orders     None         Nesta Scaturro, Canary Brim, MD 02/24/23 0401

## 2023-02-24 NOTE — Discharge Instructions (Addendum)
You were seen in the ER for evaluation for your chest pain and SOB. Please make sure you follow up with your cardiologist. I advise cutting back on your alcohol and tobacco use. I am prescribing you prednisone to help with your wheezing. Please make sure you are using your inhalers as prescribed. I am prescribing you a few days to take of Lasix as well. If you have any concerns, new or worsening symptoms, please return to the ER for re-evaluation.    Contact a doctor if: You cough up more mucus than usual. There is a change in the color or thickness of the mucus. It is harder to breathe than usual. Your breathing is faster than usual. You have trouble sleeping. You need to use your medicines more often than usual. You have trouble doing your normal activities such as getting dressed or walking around the house. Get help right away if: You have shortness of breath while resting. You have shortness of breath that stops you from: Being able to talk. Doing normal activities. Your chest hurts for longer than 5 minutes. Your skin color is more blue than usual. Your pulse oximeter shows that you have low oxygen for longer than 5 minutes. You have a fever. You feel too tired to breathe normally.

## 2023-02-25 ENCOUNTER — Emergency Department (HOSPITAL_COMMUNITY): Payer: 59

## 2023-02-25 DIAGNOSIS — R0602 Shortness of breath: Secondary | ICD-10-CM | POA: Diagnosis not present

## 2023-02-25 DIAGNOSIS — R079 Chest pain, unspecified: Secondary | ICD-10-CM | POA: Diagnosis not present

## 2023-02-25 LAB — CBC
HCT: 26.6 % — ABNORMAL LOW (ref 36.0–46.0)
Hemoglobin: 8.1 g/dL — ABNORMAL LOW (ref 12.0–15.0)
MCH: 24 pg — ABNORMAL LOW (ref 26.0–34.0)
MCHC: 30.5 g/dL (ref 30.0–36.0)
MCV: 78.9 fL — ABNORMAL LOW (ref 80.0–100.0)
Platelets: 285 10*3/uL (ref 150–400)
RBC: 3.37 MIL/uL — ABNORMAL LOW (ref 3.87–5.11)
RDW: 20.1 % — ABNORMAL HIGH (ref 11.5–15.5)
WBC: 9.8 10*3/uL (ref 4.0–10.5)
nRBC: 0 % (ref 0.0–0.2)

## 2023-02-25 LAB — BASIC METABOLIC PANEL
Anion gap: 12 (ref 5–15)
BUN: 7 mg/dL (ref 6–20)
CO2: 23 mmol/L (ref 22–32)
Calcium: 8.3 mg/dL — ABNORMAL LOW (ref 8.9–10.3)
Chloride: 104 mmol/L (ref 98–111)
Creatinine, Ser: 0.5 mg/dL (ref 0.44–1.00)
GFR, Estimated: >60 mL/min (ref >60)
Glucose, Bld: 217 mg/dL — ABNORMAL HIGH (ref 70–99)
Potassium: 3.8 mmol/L (ref 3.5–5.1)
Sodium: 139 mmol/L (ref 135–145)

## 2023-02-25 LAB — ETHANOL: Alcohol, Ethyl (B): <10 mg/dL (ref <10)

## 2023-02-25 MED ORDER — ALBUTEROL SULFATE HFA 108 (90 BASE) MCG/ACT IN AERS
1.0000 | INHALATION_SPRAY | Freq: Four times a day (QID) | RESPIRATORY_TRACT | 0 refills | Status: DC | PRN
Start: 2023-02-25 — End: 2023-04-01

## 2023-02-25 MED ORDER — METFORMIN HCL ER 750 MG PO TB24: 750.0000 mg | ORAL_TABLET | Freq: Every day | ORAL | 0 refills | Status: DC

## 2023-02-25 MED ORDER — IPRATROPIUM-ALBUTEROL 0.5-2.5 (3) MG/3ML IN SOLN
3.0000 mL | Freq: Once | RESPIRATORY_TRACT | Status: AC
  Administered 2023-02-25: 3 mL via RESPIRATORY_TRACT
  Filled 2023-02-25: qty 3

## 2023-02-25 MED ORDER — BUDESONIDE-FORMOTEROL FUMARATE 160-4.5 MCG/ACT IN AERO: 2.0000 | INHALATION_SPRAY | Freq: Two times a day (BID) | RESPIRATORY_TRACT | 2 refills | Status: DC

## 2023-02-25 MED ORDER — FAMOTIDINE 20 MG PO TABS: 20.0000 mg | ORAL_TABLET | Freq: Every day | ORAL | 0 refills | Status: DC

## 2023-02-25 MED ORDER — ACETAMINOPHEN 500 MG PO TABS
1000.0000 mg | ORAL_TABLET | Freq: Once | ORAL | Status: AC
  Administered 2023-02-25: 1000 mg via ORAL
  Filled 2023-02-25: qty 2

## 2023-02-25 NOTE — ED Provider Notes (Addendum)
[facility]-EMERGENCY DEPT [facility] Emergency Department Provider Note MRN:  [id number]  Arrival date & time: 02/25/23     Chief Complaint   Shortness of Breath and Back Pain   History of Present Illness   [name] is a 57 y.o. year-old female with a history of COPD presenting to the ED with chief complaint of shortness of breath.  Shortness of breath and back pain, brought here by EMS.  Frequent ED visitor.  Currently want something to eat.  Review of Systems  A thorough review of systems was obtained and all systems are negative except as noted in the HPI and PMH.   Patient's Health History    Past Medical History:  Diagnosis Date   Abdominal pain    Accidental drug overdose April 2013   Anxiety    Atrial fibrillation (HCC) 09/29/11   converted spontaneously   Chronic back pain    Chronic knee pain    Chronic nausea    Chronic pain    COPD (chronic obstructive pulmonary disease) (HCC)    Depression    Diabetes mellitus    states her doctor took her off all DM meds in past month   Diabetic neuropathy (HCC)    Dyspnea    with exertion    GERD (gastroesophageal reflux disease)    Headache(784.0)    migraines    HTN (hypertension)    not on meds since in a year    Hyperlipidemia    Hypothyroidism    not on meds in a while    Mental disorder    Bipolar and schizophrenic   Nausea and vomiting 01/02/2023   Requires supplemental oxygen    as needed per patient    Schizophrenia (HCC)    Schizophrenia, acute (HCC) 11/13/2017   Tobacco abuse     Past Surgical History:  Procedure Laterality Date   ABDOMINAL HYSTERECTOMY     BLADDER SUSPENSION  03/04/2011   Procedure: [facility] PROCEDURE;  Surgeon: Jonna Coup MacDiarmid;  Location: [facility] ORS;  Service: Urology;  Laterality: N/A;   BOWEL RESECTION N/A 04/18/2022   Procedure: SMALL BOWEL RESECTION;  Surgeon: Harriette Bouillon, MD;  Location: [facility] OR;  Service: General;  Laterality: N/A;   CYSTOCELE REPAIR  03/04/2011   Procedure:  ANTERIOR REPAIR (CYSTOCELE);  Surgeon: Martina Sinner;  Location: [facility] ORS;  Service: Urology;  Laterality: N/A;   CYSTOSCOPY  03/04/2011   Procedure: CYSTOSCOPY;  Surgeon: Jonna Coup MacDiarmid;  Location: [facility] ORS;  Service: Urology;  Laterality: N/A;   ESOPHAGOGASTRODUODENOSCOPY (EGD) WITH PROPOFOL N/A 05/12/2017   Procedure: ESOPHAGOGASTRODUODENOSCOPY (EGD) WITH PROPOFOL;  Surgeon: Ovidio Kin, MD;  Location: [facility];  Service: General;  Laterality: N/A;   GASTRIC ROUX-EN-Y N/A 03/25/2016   Procedure: LAPAROSCOPIC ROUX-EN-Y GASTRIC BYPASS WITH UPPER ENDOSCOPY;  Surgeon: Glenna Fellows, MD;  Location: [facility] ORS;  Service: General;  Laterality: N/A;   KNEE SURGERY     LAPAROSCOPIC ASSISTED VAGINAL HYSTERECTOMY  03/04/2011   Procedure: LAPAROSCOPIC ASSISTED VAGINAL HYSTERECTOMY;  Surgeon: Jeani Hawking, MD;  Location: [facility] ORS;  Service: Gynecology;  Laterality: N/A;   LAPAROTOMY N/A 04/18/2022   Procedure: EXPLORATORY LAPAROTOMY;  Surgeon: Harriette Bouillon, MD;  Location: [facility] OR;  Service: General;  Laterality: N/A;   LAPAROTOMY N/A 04/24/2022   Procedure: BRING BACK EXPLORATORY LAPAROTOMY;  Surgeon: Violeta Gelinas, MD;  Location: [facility] OR;  Service: General;  Laterality: N/A;   TOTAL HIP ARTHROPLASTY Right 08/27/2022   Procedure: TOTAL HIP ARTHROPLASTY;  Surgeon: Joen Laura,  MD;  Location: [facility] OR;  Service: Orthopedics;  Laterality: Right;    Family History  Problem Relation Age of Onset   Heart attack Father        69s   Diabetes Mother    Heart disease Mother    Hypertension Mother    Heart attack Sister        70   COPD Other    Breast cancer Neg Hx     Social History   Socioeconomic History   Marital status: Legally Separated    Spouse name: Not on file   Number of children: Not on file   Years of education: Not on file   Highest education level: Not on file  Occupational History   Not on file  Tobacco Use   Smoking status: Every Day    Current packs/day: 1.00     Types: Cigarettes   Smokeless tobacco: Never  Vaping Use   Vaping status: Never Used  Substance and Sexual Activity   Alcohol use: Yes    Comment: 40 ounces daily   Drug use: Yes    Types: Cocaine, Marijuana, "Crack" cocaine    Comment: last use 01/19/23   Sexual activity: Yes    Partners: Male  Other Topics Concern   Not on file  Social History Narrative   Not on file   Social Determinants of Health   Financial Resource Strain: Not on file  Food Insecurity: No Food Insecurity (02/22/2023)   Hunger Vital Sign    Worried About Running Out of Food in the Last Year: Never true    Ran Out of Food in the Last Year: Never true  Transportation Needs: No Transportation Needs (02/22/2023)   PRAPARE - Administrator, Civil Service (Medical): No    Lack of Transportation (Non-Medical): No  Physical Activity: Not on file  Stress: Not on file  Social Connections: Unknown (01/06/2023)   [facility]   Social Network    Social Network: Not on file  Intimate Partner Violence: Not At Risk (02/22/2023)   Humiliation, Afraid, Rape, and Kick questionnaire    Fear of Current or Ex-Partner: No    Emotionally Abused: No    Physically Abused: No    Sexually Abused: No     Physical Exam   Vitals:   02/25/23 0635 02/25/23 0658  BP: (!) 154/86   Pulse: (!) 122   Resp: 18   Temp:  98.4 F (36.9 C)  SpO2: 100%     CONSTITUTIONAL: Chronically ill-appearing, NAD NEURO/PSYCH:  Alert and oriented x 3, no focal deficits EYES:  eyes equal and reactive ENT/NECK:  no LAD, no JVD CARDIO: Tachycardic rate, well-perfused, normal S1 and S2 PULM:  CTAB no wheezing or rhonchi GI/GU:  non-distended, non-tender MSK/SPINE:  No gross deformities, no edema SKIN:  no rash, atraumatic   *Additional and/or pertinent findings included in MDM below  Diagnostic and Interventional Summary    EKG Interpretation Date/Time:  February 25, 2023 at 00:39:17 Ventricular Rate:  122 PR  Interval:   124 QRS Duration:   78 QT Interval:   304 QTC Calculation:  433 R Axis:      Text Interpretation: Sinus tachycardia       Labs Reviewed  BASIC METABOLIC PANEL - Abnormal; Notable for the following components:      Result Value   Glucose, Bld 217 (*)    Calcium 8.3 (*)    All other components within normal limits  CBC - Abnormal; Notable for the following components:   RBC 3.37 (*)    Hemoglobin 8.1 (*)    HCT 26.6 (*)    MCV 78.9 (*)    MCH 24.0 (*)    RDW 20.1 (*)    All other components within normal limits  ETHANOL    DG Chest 2 View  Final Result      Medications  acetaminophen (TYLENOL) tablet 1,000 mg (1,000 mg Oral Given 02/25/23 0459)  ipratropium-albuterol (DUONEB) 0.5-2.5 (3) MG/3ML nebulizer solution 3 mL (3 mLs Nebulization Given 02/25/23 0522)     Procedures  /  Critical Care Procedures  ED Course and Medical Decision Making  Initial Impression and Ddx Very frequent ED visitor this year for similar complaints of shortness of breath, back pain.  I was able to speak with patient's daughter on the phone.  Patient's daughter is a Child psychotherapist here at [employer].  Not sure why patient is having such frequent ED visits.  Concerned that patient is mentally declining, she is definitely not taking any of her medications.  She has known psychiatric disease and is not medicated.  Continues to drink alcohol but denies illicit drugs.  Given the documented history of schizophrenia and the report that she is not caring for herself, will consult TTS for evaluation.  Patient however does have capacity to make decisions at this time and she may not be willing to stay for complete evaluation.  Past medical/surgical history that increases complexity of ED encounter: COPD  Interpretation of Diagnostics I personally reviewed the EKG and my interpretation is as follows: Sinus tachycardia  Labs overall reassuring with no significant blood count or electrolyte  disturbance.  Chest x-ray normal.  Patient Reassessment and Ultimate Disposition/Management     Patient has mild tachycardia which she has had in the past during numerous ED visits.  She is having low back pain which again is consistent with prior visits.  Vital signs are reassuring.  At this point patient is considered medically cleared awaiting TTS evaluation.  Signed out to default provider.  If she decides to leave, she has capacity to do so and IVC initiation is not indicated at this time.  7 AM update: Patient has decided that she wants to leave, does not want to stay for mental health evaluation.  She denies any SI or HI, she has full capacity to make this decision.  She is still medically cleared, heart rate down to 107 on my repeat evaluation, eating a sandwich, no acute distress.  She wants her medications refilled so that she can start taking them.  Appropriate for discharge.  Patient management required discussion with the following services or consulting groups:  Psychiatry/TTS  Complexity of Problems Addressed Acute illness or injury that poses threat of life of bodily function  Additional Data Reviewed and Analyzed Further history obtained from: Further history from spouse/family member  Additional Factors Impacting ED Encounter Risk Consideration of hospitalization  Elmer Sow. [name], MD [facility] Emergency Medicine [facility] [email or web address]  Final Clinical Impressions(s) / ED Diagnoses     ICD-10-CM   1. Shortness of breath  R06.02       ED Discharge Orders          Ordered    albuterol (VENTOLIN HFA) 108 (90 Base) MCG/ACT inhaler  Every 6 hours PRN        02/25/23 0702    budesonide-formoterol (SYMBICORT) 160-4.5 MCG/ACT inhaler  2 times daily  02/25/23 0702    famotidine (PEPCID) 20 MG tablet  Daily        02/25/23 0702    metFORMIN (GLUCOPHAGE-XR) 750 MG 24 hr tablet  Daily        02/25/23 0702             Discharge  Instructions Discussed with and Provided to Patient:     Discharge Instructions      You were evaluated in the Emergency Department and after careful evaluation, we did not find any emergent condition requiring admission or further testing in the hospital.  Your exam/testing today was overall reassuring.  Please return to the Emergency Department if you experience any worsening of your condition.  Thank you for allowing [place] to be a part of your care.        Sabas Sous, MD 02/25/23 8119    Sabas Sous, MD 02/25/23 [phone]

## 2023-02-25 NOTE — Discharge Instructions (Signed)
You were evaluated in the Emergency Department and after careful evaluation, we did not find any emergent condition requiring admission or further testing in the hospital.  Your exam/testing today was overall reassuring.  Please return to the Emergency Department if you experience any worsening of your condition.  Thank you for allowing us to be a part of your care.  

## 2023-02-25 NOTE — ED Notes (Signed)
Notified Dr. [name] that the patient HR 135, she C/O of "back pain that is taking her breath away." No new orders at this time

## 2023-02-25 NOTE — ED Notes (Signed)
Attempted to get this patient's blood work however patient became uncooperative, yelling at this RN refusing a blood draw because she wants food. States over and over she is hungry. Explained unfortunately she is unable to eat until seeing the doctor. Then patient states "I didn't even want to be here, they called the ambulance but I didn't want to come". Explained importance of obtaining lab work. Patient still refusing. Wheeled to lobby. Patient then yelled at this RN stating she left her bag in the room, and threw an emesis bag at this RN. Unable to find white beg. Never saw a bag upon arrival.

## 2023-02-27 ENCOUNTER — Encounter (HOSPITAL_COMMUNITY): Payer: Self-pay | Admitting: Emergency Medicine

## 2023-02-27 ENCOUNTER — Encounter (HOSPITAL_COMMUNITY): Payer: Self-pay

## 2023-02-27 ENCOUNTER — Other Ambulatory Visit: Payer: Self-pay

## 2023-02-27 ENCOUNTER — Emergency Department (HOSPITAL_COMMUNITY)
Admission: EM | Admit: 2023-02-27 | Discharge: 2023-02-27 | Discharge status: Against Medical Advice | Payer: 59 | Attending: Emergency Medicine | Admitting: Emergency Medicine

## 2023-02-27 ENCOUNTER — Emergency Department (HOSPITAL_COMMUNITY)
Admission: EM | Admit: 2023-02-27 | Discharge: 2023-02-27 | Disposition: A | Discharge status: Home / Self Care | Payer: 59 | Attending: Emergency Medicine | Admitting: Emergency Medicine

## 2023-02-27 DIAGNOSIS — I499 Cardiac arrhythmia, unspecified: Secondary | ICD-10-CM | POA: Diagnosis not present

## 2023-02-27 DIAGNOSIS — J441 Chronic obstructive pulmonary disease with (acute) exacerbation: Secondary | ICD-10-CM | POA: Insufficient documentation

## 2023-02-27 DIAGNOSIS — L299 Pruritus, unspecified: Secondary | ICD-10-CM | POA: Diagnosis not present

## 2023-02-27 DIAGNOSIS — I10 Essential (primary) hypertension: Secondary | ICD-10-CM | POA: Diagnosis not present

## 2023-02-27 DIAGNOSIS — Z79899 Other long term (current) drug therapy: Secondary | ICD-10-CM | POA: Diagnosis not present

## 2023-02-27 DIAGNOSIS — E119 Type 2 diabetes mellitus without complications: Secondary | ICD-10-CM | POA: Diagnosis not present

## 2023-02-27 DIAGNOSIS — Z5321 Procedure and treatment not carried out due to patient leaving prior to being seen by health care provider: Secondary | ICD-10-CM | POA: Insufficient documentation

## 2023-02-27 DIAGNOSIS — R062 Wheezing: Secondary | ICD-10-CM | POA: Diagnosis not present

## 2023-02-27 DIAGNOSIS — R739 Hyperglycemia, unspecified: Secondary | ICD-10-CM | POA: Diagnosis not present

## 2023-02-27 DIAGNOSIS — R0602 Shortness of breath: Secondary | ICD-10-CM | POA: Insufficient documentation

## 2023-02-27 DIAGNOSIS — Z7984 Long term (current) use of oral hypoglycemic drugs: Secondary | ICD-10-CM | POA: Insufficient documentation

## 2023-02-27 DIAGNOSIS — Z87891 Personal history of nicotine dependence: Secondary | ICD-10-CM | POA: Insufficient documentation

## 2023-02-27 DIAGNOSIS — Z743 Need for continuous supervision: Secondary | ICD-10-CM | POA: Diagnosis not present

## 2023-02-27 DIAGNOSIS — R6889 Other general symptoms and signs: Secondary | ICD-10-CM | POA: Diagnosis not present

## 2023-02-27 DIAGNOSIS — E039 Hypothyroidism, unspecified: Secondary | ICD-10-CM | POA: Diagnosis not present

## 2023-02-27 LAB — I-STAT CHEM 8, ED
BUN: 4 mg/dL — ABNORMAL LOW (ref 6–20)
Calcium, Ion: 1.08 mmol/L — ABNORMAL LOW (ref 1.15–1.40)
Chloride: 105 mmol/L (ref 98–111)
Creatinine, Ser: 0.4 mg/dL — ABNORMAL LOW (ref 0.44–1.00)
Glucose, Bld: 228 mg/dL — ABNORMAL HIGH (ref 70–99)
HCT: 30 % — ABNORMAL LOW (ref 36.0–46.0)
Hemoglobin: 10.2 g/dL — ABNORMAL LOW (ref 12.0–15.0)
Potassium: 3.8 mmol/L (ref 3.5–5.1)
Sodium: 141 mmol/L (ref 135–145)
TCO2: 24 mmol/L (ref 22–32)

## 2023-02-27 LAB — CULTURE, BLOOD (ROUTINE X 2)
Culture: NO GROWTH
Culture: NO GROWTH
Special Requests: ADEQUATE

## 2023-02-27 MED ORDER — ALBUTEROL SULFATE (2.5 MG/3ML) 0.083% IN NEBU
10.0000 mg/h | INHALATION_SOLUTION | Freq: Once | RESPIRATORY_TRACT | Status: AC
  Administered 2023-02-27: 10 mg/h via RESPIRATORY_TRACT
  Filled 2023-02-27: qty 3

## 2023-02-27 NOTE — ED Triage Notes (Signed)
Pt coming in from home with a complaint of shortness of breath. Ems gave 1 douneb. 125 mg of solumdrol .  Bp 128/68 Hr 122 Resp 22

## 2023-02-27 NOTE — ED Provider Notes (Signed)
[place] EMERGENCY DEPARTMENT AT [facility] Provider Note   CSN: [id number] Arrival date & time: 02/27/23  8295     History  Chief Complaint  Patient presents with   Shortness of Breath    [name] is a 57 y.o. female.  The history is provided by the patient.   Patient with extensive history including COPD, diabetes, tobacco use presents with shortness of breath.  Patient reports she woke up short of breath and wheezing.  She also reports she is thirsty.  EMS was called and she was given DuoNeb and Solu-Medrol.  She is currently requesting something to drink   Past Medical History:  Diagnosis Date   Abdominal pain    Accidental drug overdose April 2013   Anxiety    Atrial fibrillation (HCC) 09/29/11   converted spontaneously   Chronic back pain    Chronic knee pain    Chronic nausea    Chronic pain    COPD (chronic obstructive pulmonary disease) (HCC)    Depression    Diabetes mellitus    states her doctor took her off all DM meds in past month   Diabetic neuropathy (HCC)    Dyspnea    with exertion    GERD (gastroesophageal reflux disease)    Headache(784.0)    migraines    HTN (hypertension)    not on meds since in a year    Hyperlipidemia    Hypothyroidism    not on meds in a while    Mental disorder    Bipolar and schizophrenic   Nausea and vomiting 01/02/2023   Requires supplemental oxygen    as needed per patient    Schizophrenia (HCC)    Schizophrenia, acute (HCC) 11/13/2017   Tobacco abuse     Home Medications Prior to Admission medications   Medication Sig Start Date End Date Taking? Authorizing Provider  albuterol (VENTOLIN HFA) 108 (90 Base) MCG/ACT inhaler Inhale 1-2 puffs into the lungs every 6 (six) hours as needed for wheezing or shortness of breath. 02/25/23   Sabas Sous, MD  budesonide-formoterol (SYMBICORT) 160-4.5 MCG/ACT inhaler Inhale 2 puffs into the lungs 2 (two) times daily. 02/25/23   Sabas Sous, MD  famotidine  (PEPCID) 20 MG tablet Take 1 tablet (20 mg total) by mouth daily. 02/25/23   Sabas Sous, MD  furosemide (LASIX) 20 MG tablet Take 1 tablet (20 mg total) by mouth daily. 02/24/23   Achille Rich, PA-C  hyoscyamine (LEVSIN/SL) 0.125 MG SL tablet Place 1 tablet (0.125 mg total) under the tongue every 4 (four) hours as needed (pain). Up to 1.25 mg daily Patient not taking: Reported on 02/22/2023 01/23/23   Arthor Captain, PA-C  metFORMIN (GLUCOPHAGE-XR) 750 MG 24 hr tablet Take 1 tablet (750 mg total) by mouth daily. 02/25/23 03/27/23  Sabas Sous, MD  ondansetron (ZOFRAN) 4 MG tablet Take 1 tablet (4 mg total) by mouth every 6 (six) hours. Patient not taking: Reported on 02/22/2023 01/20/23   Prosperi, Christian H, PA-C  ondansetron (ZOFRAN-ODT) 4 MG disintegrating tablet Take 1 tablet (4 mg total) by mouth every 8 (eight) hours as needed for nausea or vomiting. 01/23/23   Arthor Captain, PA-C  OXYGEN Inhale 4-5 L into the lungs as needed (COPD).    [provider]  predniSONE (DELTASONE) 20 MG tablet Take 2 tablets (40 mg total) by mouth daily for 5 days. 02/24/23 03/01/23  Achille Rich, PA-C  sucralfate (CARAFATE) 1 g tablet Take  1 tablet (1 g total) by mouth 4 (four) times daily -  with meals and at bedtime. Patient not taking: Reported on 02/22/2023 02/11/23   Horton, Mayer Masker, MD  zonisamide (ZONEGRAN) 100 MG capsule Take 2 capsules (200 mg total) by mouth daily. Patient not taking: Reported on 02/22/2023 01/12/23   Lewie Chamber, MD      Allergies    Iron dextran, Aspirin, and Penicillins    Review of Systems   Review of Systems  Respiratory:  Positive for shortness of breath.   Cardiovascular:  Negative for chest pain.    Physical Exam Updated Vital Signs BP 120/69   Pulse (!) 122   Temp 97.9 F (36.6 C) (Oral)   Resp (!) 61   Ht 1.575 m (5\' 2" )   Wt 56 kg   LMP 01/08/2011   SpO2 100%   BMI 22.58 kg/m  Physical Exam CONSTITUTIONAL: Chronically ill-appearing, anxious HEAD:  Normocephalic/atraumatic ENMT: Mucous membranes moist NECK: supple no meningeal signs CV: S1/S2 noted, tachycardic LUNGS: Tachypneic, wheezing bilaterally ABDOMEN: soft, nontender NEURO: Pt is awake/alert/appropriate, moves all extremitiesx4.  No facial droop.   EXTREMITIES: pulses normal/equal, full ROM SKIN: warm, color normal PSYCH: Anxious  ED Results / Procedures / Treatments   Labs (all labs ordered are listed, but only abnormal results are displayed) Labs Reviewed  I-STAT CHEM 8, ED - Abnormal; Notable for the following components:      Result Value   BUN 4 (*)    Creatinine, Ser 0.40 (*)    Glucose, Bld 228 (*)    Calcium, Ion 1.08 (*)    Hemoglobin 10.2 (*)    HCT 30.0 (*)    All other components within normal limits    EKG ED ECG REPORT   Date: 02/27/2023 0555am  Rate: 122  Rhythm: sinus tachycardia  QRS Axis: normal  Intervals: normal  ST/T Wave abnormalities: normal  Conduction Disutrbances:none  I have personally reviewed the EKG tracing and agree with the computerized printout as noted.   Radiology No results found.  Procedures Procedures    Medications Ordered in ED Medications  albuterol (PROVENTIL) (2.5 MG/3ML) 0.083% nebulizer solution (10 mg/hr Nebulization Given 02/27/23 0651)    ED Course/ Medical Decision Making/ A&P Clinical Course as of 02/27/23 4098  Fri Feb 27, 2023  1191 Patient presents for her 42nd ER visit in 6 months patient presents with wheezing and shortness of breath.  Will give a nebulized therapy and reassess.  Will defer x-ray, as she just had a chest x-ray several days ago [DW]  0712 Patient reports feeling improved.  Her lung sounds are improved.  She is in no acute distress Patient is mildly tachycardic though this is likely due to the albuterol as well as anxiety.  Patient is requesting discharge home. [DW]    Clinical Course User Index [DW] Zadie Rhine, MD                                 Medical Decision  Making Risk Prescription drug management.   This patient presents to the ED for concern of shortness of breath, this involves an extensive number of treatment options, and is a complaint that carries with it a high risk of complications and morbidity.  The differential diagnosis includes but is not limited to Acute coronary syndrome, pneumonia, acute pulmonary edema, pneumothorax, acute anemia, pulmonary embolism   Comorbidities that complicate the patient evaluation: Patient's  presentation is complicated by their history of COPD  Social Determinants of Health: Patient's  frequent ED visits   increases the complexity of managing their presentation  Additional history obtained: Records reviewed previous admission documents  Lab Tests: I Ordered, and personally interpreted labs.  The pertinent results include: Mild hyperglycemia  Medicines ordered and prescription drug management: I ordered medication including albuterol  for wheezing  Reevaluation of the patient after these medicines showed that the patient    improved   Reevaluation: After the interventions noted above, I reevaluated the patient and found that they have :improved  Complexity of problems addressed: Patient's presentation is most consistent with  exacerbation of chronic illness  Disposition: After consideration of the diagnostic results and the patient's response to treatment,  I feel that the patent would benefit from discharge   .           Final Clinical Impression(s) / ED Diagnoses Final diagnoses:  COPD exacerbation [facility])    Rx / DC Orders ED Discharge Orders     None         Zadie Rhine, MD 02/27/23 [phone]

## 2023-02-27 NOTE — ED Notes (Signed)
Pt refusing to remain connected to monitor. Pt up moving around in room and hallway at this time.

## 2023-02-27 NOTE — ED Triage Notes (Signed)
BIB EMS from home.  Pt complains of shob and "my back itches"  Pt was seen this morning for same.  Pt smells strongly of ETOH and is belligerent with EMS

## 2023-02-27 NOTE — ED Notes (Signed)
Pt left with son in law.

## 2023-02-27 NOTE — ED Notes (Signed)
Pt is leaving, states she no longer wants to wait here. Daughter was called and to come pick her up. Son in law is on the way.

## 2023-03-01 ENCOUNTER — Emergency Department (HOSPITAL_COMMUNITY)
Admission: EM | Admit: 2023-03-01 | Discharge: 2023-03-01 | Disposition: A | Discharge status: Home / Self Care | Payer: 59 | Attending: Emergency Medicine | Admitting: Emergency Medicine

## 2023-03-01 ENCOUNTER — Emergency Department (HOSPITAL_COMMUNITY): Payer: 59

## 2023-03-01 ENCOUNTER — Other Ambulatory Visit: Payer: Self-pay

## 2023-03-01 ENCOUNTER — Encounter (HOSPITAL_COMMUNITY): Payer: Self-pay

## 2023-03-01 DIAGNOSIS — R110 Nausea: Secondary | ICD-10-CM | POA: Diagnosis not present

## 2023-03-01 DIAGNOSIS — R1084 Generalized abdominal pain: Secondary | ICD-10-CM | POA: Diagnosis not present

## 2023-03-01 DIAGNOSIS — I10 Essential (primary) hypertension: Secondary | ICD-10-CM | POA: Diagnosis not present

## 2023-03-01 DIAGNOSIS — R1031 Right lower quadrant pain: Secondary | ICD-10-CM | POA: Insufficient documentation

## 2023-03-01 DIAGNOSIS — K769 Liver disease, unspecified: Secondary | ICD-10-CM | POA: Diagnosis not present

## 2023-03-01 DIAGNOSIS — R112 Nausea with vomiting, unspecified: Secondary | ICD-10-CM | POA: Diagnosis not present

## 2023-03-01 DIAGNOSIS — J449 Chronic obstructive pulmonary disease, unspecified: Secondary | ICD-10-CM | POA: Diagnosis not present

## 2023-03-01 DIAGNOSIS — Z743 Need for continuous supervision: Secondary | ICD-10-CM | POA: Diagnosis not present

## 2023-03-01 DIAGNOSIS — R109 Unspecified abdominal pain: Secondary | ICD-10-CM | POA: Diagnosis not present

## 2023-03-01 DIAGNOSIS — R1032 Left lower quadrant pain: Secondary | ICD-10-CM | POA: Insufficient documentation

## 2023-03-01 LAB — COMPREHENSIVE METABOLIC PANEL
ALT: 18 U/L (ref 0–44)
AST: 23 U/L (ref 15–41)
Albumin: 3.2 g/dL — ABNORMAL LOW (ref 3.5–5.0)
Alkaline Phosphatase: 98 U/L (ref 38–126)
Anion gap: 12 (ref 5–15)
BUN: 6 mg/dL (ref 6–20)
CO2: 23 mmol/L (ref 22–32)
Calcium: 8 mg/dL — ABNORMAL LOW (ref 8.9–10.3)
Chloride: 101 mmol/L (ref 98–111)
Creatinine, Ser: 0.53 mg/dL (ref 0.44–1.00)
GFR, Estimated: >60 mL/min (ref >60)
Glucose, Bld: 237 mg/dL — ABNORMAL HIGH (ref 70–99)
Potassium: 4 mmol/L (ref 3.5–5.1)
Sodium: 136 mmol/L (ref 135–145)
Total Bilirubin: 0.6 mg/dL (ref 0.3–1.2)
Total Protein: 6.4 g/dL — ABNORMAL LOW (ref 6.5–8.1)

## 2023-03-01 LAB — URINALYSIS, ROUTINE W REFLEX MICROSCOPIC
Bilirubin Urine: NEGATIVE
Glucose, UA: NEGATIVE mg/dL
Ketones, ur: NEGATIVE mg/dL
Leukocytes,Ua: NEGATIVE
Nitrite: NEGATIVE
Protein, ur: NEGATIVE mg/dL
Specific Gravity, Urine: 1.006 (ref 1.005–1.030)
pH: 5 (ref 5.0–8.0)

## 2023-03-01 LAB — CBC
HCT: 26.1 % — ABNORMAL LOW (ref 36.0–46.0)
Hemoglobin: 7.9 g/dL — ABNORMAL LOW (ref 12.0–15.0)
MCH: 23.8 pg — ABNORMAL LOW (ref 26.0–34.0)
MCHC: 30.3 g/dL (ref 30.0–36.0)
MCV: 78.6 fL — ABNORMAL LOW (ref 80.0–100.0)
Platelets: 373 10*3/uL (ref 150–400)
RBC: 3.32 MIL/uL — ABNORMAL LOW (ref 3.87–5.11)
RDW: 20.4 % — ABNORMAL HIGH (ref 11.5–15.5)
WBC: 9 10*3/uL (ref 4.0–10.5)
nRBC: 0 % (ref 0.0–0.2)

## 2023-03-01 LAB — LIPASE, BLOOD: Lipase: 41 U/L (ref 11–51)

## 2023-03-01 MED ORDER — METOCLOPRAMIDE HCL 5 MG/ML IJ SOLN
5.0000 mg | Freq: Once | INTRAMUSCULAR | Status: AC
  Administered 2023-03-01: 5 mg via INTRAVENOUS
  Filled 2023-03-01: qty 2

## 2023-03-01 MED ORDER — LACTATED RINGERS IV BOLUS: 1000.0000 mL | Freq: Once | INTRAVENOUS | Status: DC

## 2023-03-01 MED ORDER — ONDANSETRON 4 MG PO TBDP
4.0000 mg | ORAL_TABLET | Freq: Once | ORAL | Status: AC | PRN
  Administered 2023-03-01: 4 mg via ORAL
  Filled 2023-03-01: qty 1

## 2023-03-01 MED ORDER — ONDANSETRON HCL 4 MG/2ML IJ SOLN
4.0000 mg | Freq: Once | INTRAMUSCULAR | Status: AC
  Administered 2023-03-01: 4 mg via INTRAVENOUS
  Filled 2023-03-01: qty 2

## 2023-03-01 MED ORDER — IOHEXOL 350 MG/ML SOLN
75.0000 mL | Freq: Once | INTRAVENOUS | Status: AC | PRN
  Administered 2023-03-01: 75 mL via INTRAVENOUS

## 2023-03-01 MED ORDER — ONDANSETRON 4 MG PO TBDP: 4.0000 mg | ORAL_TABLET | Freq: Three times a day (TID) | ORAL | 0 refills | Status: DC | PRN

## 2023-03-01 MED ORDER — ACETAMINOPHEN 500 MG PO TABS: 1000.0000 mg | ORAL_TABLET | Freq: Once | ORAL | Status: DC

## 2023-03-01 NOTE — Discharge Instructions (Addendum)
I recommend you follow-up with your primary care doctor to have your hemoglobin rechecked.  If you develop any new or worsening symptoms you should return to the ED.

## 2023-03-01 NOTE — ED Notes (Signed)
Patient actively dry heaving in triage. Patient was given medication. Endorses alcohol use yesterday and then falling asleep. Patient states she woke up feel sick. Upon arrival to triage, she was assisted to the bathroom for a bowel movement.

## 2023-03-01 NOTE — ED Provider Notes (Signed)
[place] EMERGENCY DEPARTMENT AT [facility] Provider Note   CSN: [id number] Arrival date & time: 03/01/23  0435     History  Chief Complaint  Patient presents with   Abdominal Pain    [name] is a 57 y.o. female.   Abdominal Pain 57 year old female history of schizophrenia, COPD, GERD, hypertension presenting for abdominal pain.  Patient states she woke up today with multiple doses of nonbloody and bilious emesis as well as abdominal pain.  Pain is worse over her lower abdomen.  She has some pain to her lower back as well.  She is not any falls or trauma.  No weakness or numbness in her legs or groin.  No urinary symptoms.  She is on multiple doses of emesis.  Normal bowel movements.  No fevers or chills.  No chest pain or shortness of breath.  She states she usually smokes crack cocaine but has not for couple weeks.  She also drinks alcohol regularly but has not had any alcohol since yesterday although no history of withdrawal and states she can go a day without drinking without any issues.     Home Medications Prior to Admission medications   Medication Sig Start Date End Date Taking? Authorizing Provider  ondansetron (ZOFRAN-ODT) 4 MG disintegrating tablet Take 1 tablet (4 mg total) by mouth every 8 (eight) hours as needed for nausea or vomiting. 03/01/23  Yes Laurence Spates, MD  albuterol (VENTOLIN HFA) 108 (90 Base) MCG/ACT inhaler Inhale 1-2 puffs into the lungs every 6 (six) hours as needed for wheezing or shortness of breath. 02/25/23   Sabas Sous, MD  budesonide-formoterol (SYMBICORT) 160-4.5 MCG/ACT inhaler Inhale 2 puffs into the lungs 2 (two) times daily. 02/25/23   Sabas Sous, MD  famotidine (PEPCID) 20 MG tablet Take 1 tablet (20 mg total) by mouth daily. 02/25/23   Sabas Sous, MD  furosemide (LASIX) 20 MG tablet Take 1 tablet (20 mg total) by mouth daily. 02/24/23   Achille Rich, PA-C  hyoscyamine (LEVSIN/SL) 0.125 MG SL tablet Place 1 tablet  (0.125 mg total) under the tongue every 4 (four) hours as needed (pain). Up to 1.25 mg daily Patient not taking: Reported on 02/22/2023 01/23/23   Arthor Captain, PA-C  metFORMIN (GLUCOPHAGE-XR) 750 MG 24 hr tablet Take 1 tablet (750 mg total) by mouth daily. 02/25/23 03/27/23  Sabas Sous, MD  ondansetron (ZOFRAN) 4 MG tablet Take 1 tablet (4 mg total) by mouth every 6 (six) hours. Patient not taking: Reported on 02/22/2023 01/20/23   Prosperi, Christian H, PA-C  OXYGEN Inhale 4-5 L into the lungs as needed (COPD).    [provider]  predniSONE (DELTASONE) 20 MG tablet Take 2 tablets (40 mg total) by mouth daily for 5 days. 02/24/23 03/01/23  Achille Rich, PA-C  sucralfate (CARAFATE) 1 g tablet Take 1 tablet (1 g total) by mouth 4 (four) times daily -  with meals and at bedtime. Patient not taking: Reported on 02/22/2023 02/11/23   Horton, Mayer Masker, MD  zonisamide (ZONEGRAN) 100 MG capsule Take 2 capsules (200 mg total) by mouth daily. Patient not taking: Reported on 02/22/2023 01/12/23   Lewie Chamber, MD      Allergies    Iron dextran, Aspirin, and Penicillins    Review of Systems   Review of Systems  Gastrointestinal:  Positive for abdominal pain.  Review of systems completed and notable as per HPI.  ROS otherwise negative.   Physical Exam  Updated Vital Signs BP 121/66   Pulse (!) 104   Temp 98.7 F (37.1 C) (Oral)   Resp 18   LMP 01/08/2011   SpO2 99%  Physical Exam Vitals and nursing note reviewed.  Constitutional:      Comments: Vomiting  HENT:     Head: Normocephalic and atraumatic.  Eyes:     Conjunctiva/sclera: Conjunctivae normal.  Cardiovascular:     Rate and Rhythm: Normal rate and regular rhythm.     Heart sounds: No murmur heard. Pulmonary:     Effort: Pulmonary effort is normal. No respiratory distress.     Breath sounds: Normal breath sounds.  Abdominal:     Palpations: Abdomen is soft.     Tenderness: There is abdominal tenderness in the right lower  quadrant, suprapubic area and left lower quadrant. There is no right CVA tenderness, left CVA tenderness, guarding or rebound.  Musculoskeletal:        General: No swelling.     Cervical back: Neck supple.  Skin:    General: Skin is warm and dry.     Capillary Refill: Capillary refill takes less than 2 seconds.  Neurological:     General: No focal deficit present.     Mental Status: She is alert and oriented to person, place, and time.  Psychiatric:        Mood and Affect: Mood normal.     ED Results / Procedures / Treatments   Labs (all labs ordered are listed, but only abnormal results are displayed) Labs Reviewed  COMPREHENSIVE METABOLIC PANEL - Abnormal; Notable for the following components:      Result Value   Glucose, Bld 237 (*)    Calcium 8.0 (*)    Total Protein 6.4 (*)    Albumin 3.2 (*)    All other components within normal limits  CBC - Abnormal; Notable for the following components:   RBC 3.32 (*)    Hemoglobin 7.9 (*)    HCT 26.1 (*)    MCV 78.6 (*)    MCH 23.8 (*)    RDW 20.4 (*)    All other components within normal limits  URINALYSIS, ROUTINE W REFLEX MICROSCOPIC - Abnormal; Notable for the following components:   APPearance HAZY (*)    Hgb urine dipstick MODERATE (*)    Bacteria, UA RARE (*)    All other components within normal limits  LIPASE, BLOOD    EKG None  Radiology CT ABDOMEN PELVIS W CONTRAST  Result Date: 03/01/2023 CLINICAL DATA:  Acute abdominal pain. EXAM: CT ABDOMEN AND PELVIS WITH CONTRAST TECHNIQUE: Multidetector CT imaging of the abdomen and pelvis was performed using the standard protocol following bolus administration of intravenous contrast. RADIATION DOSE REDUCTION: This exam was performed according to the departmental dose-optimization program which includes automated exposure control, adjustment of the mA and/or kV according to patient size and/or use of iterative reconstruction technique. CONTRAST:  75mL OMNIPAQUE IOHEXOL 350  MG/ML SOLN COMPARISON:  01/22/2023 FINDINGS: Lower Chest: No acute findings. Hepatobiliary: No suspicious hepatic masses identified. Tiny sub-cm low-attenuation lesion in posterior right hepatic lobe remains stable, likely representing a tiny cyst. Gallbladder is unremarkable. No evidence of biliary ductal dilatation. Pancreas:  No mass or inflammatory changes. Spleen: Within normal limits in size and appearance. Adrenals/Urinary Tract: No suspicious masses identified. No evidence of ureteral calculi or hydronephrosis. 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 No evidence of obstruction, inflammatory process or abnormal fluid collections. Vascular/Lymphatic: No pathologically enlarged lymph nodes.  No acute vascular findings. Reproductive: Poor visualization due to severe artifact from right hip prosthesis. Other:  None. Musculoskeletal:  No suspicious bone lesions identified. IMPRESSION: No acute findings or other significant abnormality. Electronically Signed   By: Danae Orleans M.D.   On: 03/01/2023 10:06    Procedures Procedures    Medications Ordered in ED Medications  lactated ringers bolus 1,000 mL (1,000 mLs Intravenous Not Given 03/01/23 0911)  acetaminophen (TYLENOL) tablet 1,000 mg (1,000 mg Oral Patient Refused/Not Given 03/01/23 1131)  ondansetron (ZOFRAN-ODT) disintegrating tablet 4 mg (4 mg Oral Given 03/01/23 0445)  ondansetron (ZOFRAN) injection 4 mg (4 mg Intravenous Given 03/01/23 0846)  iohexol (OMNIPAQUE) 350 MG/ML injection 75 mL (75 mLs Intravenous Contrast Given 03/01/23 0926)  metoCLOPramide (REGLAN) injection 5 mg (5 mg Intravenous Given 03/01/23 1009)    ED Course/ Medical Decision Making/ A&P                                  Medical Decision Making Amount and/or Complexity of Data Reviewed Labs: ordered. Radiology: ordered.  Risk OTC drugs. Prescription drug management.   Medical Decision Making:   [name] is a 57 y.o. female who presented  to the ED today with abdominal pain, vomiting.  No signs normal for tachycardia.  On exam she is dry heaving.  She reports mild episodes of nonbloody and bilious emesis as well as lower abdominal pain.  Slightly tender on exam without peritonitis.  Differential including obstruction, constipation, pancreatitis, diverticulitis, appendicitis.  Also has history of gastritis although pain seems more inferior than this.  She reports history of alcohol and cocaine use.  No history of withdrawal and is not tremulous, lower suspicion for acute alcohol withdrawal.  Hemoglobin here is 7.9, she had an i-STAT yesterday which showed 10 but 4 days ago and actual CBC was 8.1 so appears stable.  She has not had any melena or hematochezia.  Will treat symptomatically and obtain CT scan.   Patient placed on continuous vitals and telemetry monitoring while in ED which was reviewed periodically.  Reviewed and confirmed nursing documentation for past medical history, family history, social history.   Reassessment and Plan:   On reassessment she just has some chronic back pain.  Abdominal exam benign.  She is tolerant p.o., walking and eating without difficulty.  No additional vomiting.  Will give prescription for Zofran, Tylenol for back pain.  CT scan work appears reassuring.  Did discuss her hemoglobin, recommend she have this rechecked.  No signs of bleeding here.  She is comfortable with plan for discharge.  Recommend PCP follow-up.  Return precautions given.   Patient's presentation is most consistent with acute complicated illness / injury requiring diagnostic workup.           Final Clinical Impression(s) / ED Diagnoses Final diagnoses:  Generalized abdominal pain    Rx / DC Orders ED Discharge Orders          Ordered    ondansetron (ZOFRAN-ODT) 4 MG disintegrating tablet  Every 8 hours PRN        03/01/23 1107              Laurence Spates, MD 03/01/23 1614

## 2023-03-01 NOTE — ED Triage Notes (Signed)
Patient arrives via EMS for abdominal pain and dry heaves.

## 2023-03-06 ENCOUNTER — Other Ambulatory Visit: Payer: Self-pay

## 2023-03-06 ENCOUNTER — Encounter (HOSPITAL_COMMUNITY): Payer: Self-pay | Admitting: Emergency Medicine

## 2023-03-06 ENCOUNTER — Observation Stay (HOSPITAL_COMMUNITY)
Admission: EM | Admit: 2023-03-06 | Discharge: 2023-03-16 | Disposition: A | Discharge status: Home / Self Care | Payer: 59 | Attending: Family Medicine | Admitting: Family Medicine

## 2023-03-06 DIAGNOSIS — F1994 Other psychoactive substance use, unspecified with psychoactive substance-induced mood disorder: Secondary | ICD-10-CM | POA: Diagnosis present

## 2023-03-06 DIAGNOSIS — E871 Hypo-osmolality and hyponatremia: Secondary | ICD-10-CM | POA: Insufficient documentation

## 2023-03-06 DIAGNOSIS — F17210 Nicotine dependence, cigarettes, uncomplicated: Secondary | ICD-10-CM | POA: Insufficient documentation

## 2023-03-06 DIAGNOSIS — F259 Schizoaffective disorder, unspecified: Secondary | ICD-10-CM | POA: Diagnosis present

## 2023-03-06 DIAGNOSIS — F1020 Alcohol dependence, uncomplicated: Secondary | ICD-10-CM | POA: Diagnosis present

## 2023-03-06 DIAGNOSIS — Z79899 Other long term (current) drug therapy: Secondary | ICD-10-CM | POA: Diagnosis not present

## 2023-03-06 DIAGNOSIS — E039 Hypothyroidism, unspecified: Secondary | ICD-10-CM | POA: Diagnosis present

## 2023-03-06 DIAGNOSIS — I10 Essential (primary) hypertension: Secondary | ICD-10-CM | POA: Insufficient documentation

## 2023-03-06 DIAGNOSIS — D62 Acute posthemorrhagic anemia: Secondary | ICD-10-CM | POA: Insufficient documentation

## 2023-03-06 DIAGNOSIS — Z96641 Presence of right artificial hip joint: Secondary | ICD-10-CM | POA: Insufficient documentation

## 2023-03-06 DIAGNOSIS — E119 Type 2 diabetes mellitus without complications: Secondary | ICD-10-CM | POA: Diagnosis not present

## 2023-03-06 DIAGNOSIS — Z7984 Long term (current) use of oral hypoglycemic drugs: Secondary | ICD-10-CM | POA: Diagnosis not present

## 2023-03-06 DIAGNOSIS — F250 Schizoaffective disorder, bipolar type: Principal | ICD-10-CM | POA: Insufficient documentation

## 2023-03-06 DIAGNOSIS — K219 Gastro-esophageal reflux disease without esophagitis: Secondary | ICD-10-CM | POA: Diagnosis present

## 2023-03-06 DIAGNOSIS — F1910 Other psychoactive substance abuse, uncomplicated: Secondary | ICD-10-CM | POA: Diagnosis present

## 2023-03-06 DIAGNOSIS — J449 Chronic obstructive pulmonary disease, unspecified: Secondary | ICD-10-CM | POA: Diagnosis not present

## 2023-03-06 DIAGNOSIS — F10920 Alcohol use, unspecified with intoxication, uncomplicated: Secondary | ICD-10-CM

## 2023-03-06 DIAGNOSIS — R4689 Other symptoms and signs involving appearance and behavior: Principal | ICD-10-CM

## 2023-03-06 DIAGNOSIS — E538 Deficiency of other specified B group vitamins: Secondary | ICD-10-CM | POA: Diagnosis not present

## 2023-03-06 DIAGNOSIS — D649 Anemia, unspecified: Secondary | ICD-10-CM | POA: Diagnosis present

## 2023-03-06 DIAGNOSIS — I4891 Unspecified atrial fibrillation: Secondary | ICD-10-CM | POA: Diagnosis not present

## 2023-03-06 DIAGNOSIS — J441 Chronic obstructive pulmonary disease with (acute) exacerbation: Secondary | ICD-10-CM | POA: Diagnosis present

## 2023-03-06 LAB — COMPREHENSIVE METABOLIC PANEL
ALT: 16 U/L (ref 0–44)
AST: 18 U/L (ref 15–41)
Albumin: 2.8 g/dL — ABNORMAL LOW (ref 3.5–5.0)
Alkaline Phosphatase: 101 U/L (ref 38–126)
Anion gap: 12 (ref 5–15)
BUN: 6 mg/dL (ref 6–20)
CO2: 21 mmol/L — ABNORMAL LOW (ref 22–32)
Calcium: 8.3 mg/dL — ABNORMAL LOW (ref 8.9–10.3)
Chloride: 102 mmol/L (ref 98–111)
Creatinine, Ser: 0.53 mg/dL (ref 0.44–1.00)
GFR, Estimated: >60 mL/min (ref >60)
Glucose, Bld: 153 mg/dL — ABNORMAL HIGH (ref 70–99)
Potassium: 4.2 mmol/L (ref 3.5–5.1)
Sodium: 135 mmol/L (ref 135–145)
Total Bilirubin: 0.2 mg/dL — ABNORMAL LOW (ref 0.3–1.2)
Total Protein: 6.2 g/dL — ABNORMAL LOW (ref 6.5–8.1)

## 2023-03-06 LAB — CBC WITH DIFFERENTIAL/PLATELET
Abs Immature Granulocytes: 0.02 10*3/uL (ref 0.00–0.07)
Basophils Absolute: 0 10*3/uL (ref 0.0–0.1)
Basophils Relative: 1 %
Eosinophils Absolute: 0.1 10*3/uL (ref 0.0–0.5)
Eosinophils Relative: 2 %
HCT: 25.5 % — ABNORMAL LOW (ref 36.0–46.0)
Hemoglobin: 7.5 g/dL — ABNORMAL LOW (ref 12.0–15.0)
Immature Granulocytes: 0 %
Lymphocytes Relative: 25 %
Lymphs Abs: 1.6 10*3/uL (ref 0.7–4.0)
MCH: 22.9 pg — ABNORMAL LOW (ref 26.0–34.0)
MCHC: 29.4 g/dL — ABNORMAL LOW (ref 30.0–36.0)
MCV: 78 fL — ABNORMAL LOW (ref 80.0–100.0)
Monocytes Absolute: 0.4 10*3/uL (ref 0.1–1.0)
Monocytes Relative: 7 %
Neutro Abs: 4.3 10*3/uL (ref 1.7–7.7)
Neutrophils Relative %: 65 %
Platelets: 327 10*3/uL (ref 150–400)
RBC: 3.27 MIL/uL — ABNORMAL LOW (ref 3.87–5.11)
RDW: 21 % — ABNORMAL HIGH (ref 11.5–15.5)
WBC: 6.5 10*3/uL (ref 4.0–10.5)
nRBC: 0 % (ref 0.0–0.2)

## 2023-03-06 LAB — RAPID URINE DRUG SCREEN, HOSP PERFORMED
Amphetamines: NOT DETECTED
Barbiturates: NOT DETECTED
Benzodiazepines: POSITIVE — AB
Cocaine: NOT DETECTED
Opiates: NOT DETECTED
Tetrahydrocannabinol: NOT DETECTED

## 2023-03-06 LAB — ETHANOL: Alcohol, Ethyl (B): 193 mg/dL — ABNORMAL HIGH (ref <10)

## 2023-03-06 LAB — ACETAMINOPHEN LEVEL: Acetaminophen (Tylenol), Serum: <10 ug/mL — ABNORMAL LOW (ref 10–30)

## 2023-03-06 LAB — SALICYLATE LEVEL: Salicylate Lvl: <7 mg/dL — ABNORMAL LOW (ref 7.0–30.0)

## 2023-03-06 MED ORDER — ALBUTEROL SULFATE (2.5 MG/3ML) 0.083% IN NEBU
2.5000 mg | INHALATION_SOLUTION | Freq: Four times a day (QID) | RESPIRATORY_TRACT | Status: DC | PRN
  Administered 2023-03-07: 2.5 mg via RESPIRATORY_TRACT
  Filled 2023-03-06: qty 3

## 2023-03-06 MED ORDER — FUROSEMIDE 20 MG PO TABS
20.0000 mg | ORAL_TABLET | Freq: Every day | ORAL | Status: DC
  Administered 2023-03-07 – 2023-03-16 (×10): 20 mg via ORAL
  Filled 2023-03-06 (×11): qty 1

## 2023-03-06 MED ORDER — FAMOTIDINE 20 MG PO TABS
20.0000 mg | ORAL_TABLET | Freq: Every day | ORAL | Status: DC
  Administered 2023-03-07 – 2023-03-14 (×8): 20 mg via ORAL
  Filled 2023-03-06 (×9): qty 1

## 2023-03-06 MED ORDER — ALBUTEROL SULFATE HFA 108 (90 BASE) MCG/ACT IN AERS: 1.0000 | INHALATION_SPRAY | Freq: Four times a day (QID) | RESPIRATORY_TRACT | Status: DC | PRN

## 2023-03-06 MED ORDER — HALOPERIDOL LACTATE 5 MG/ML IJ SOLN
5.0000 mg | Freq: Once | INTRAMUSCULAR | Status: AC
  Administered 2023-03-06: 5 mg via INTRAMUSCULAR
  Filled 2023-03-06: qty 1

## 2023-03-06 MED ORDER — MIDAZOLAM HCL 2 MG/2ML IJ SOLN
2.0000 mg | Freq: Once | INTRAMUSCULAR | Status: AC
  Administered 2023-03-06: 2 mg via INTRAMUSCULAR
  Filled 2023-03-06: qty 2

## 2023-03-06 MED ORDER — FLUTICASONE FUROATE-VILANTEROL 200-25 MCG/ACT IN AEPB
1.0000 | INHALATION_SPRAY | Freq: Every day | RESPIRATORY_TRACT | Status: DC
  Administered 2023-03-07 – 2023-03-15 (×7): 1 via RESPIRATORY_TRACT
  Filled 2023-03-06 (×3): qty 28

## 2023-03-06 NOTE — ED Notes (Signed)
Safety attendant here at this time for 1:1 observation.

## 2023-03-06 NOTE — ED Notes (Signed)
Pt too combative for vital signs at this time. Pt still handcuffed with [place] [employer] at bedside.

## 2023-03-06 NOTE — ED Provider Notes (Signed)
[place] EMERGENCY DEPARTMENT AT [facility] Provider Note   CSN: [id number] Arrival date & time: 03/06/23  2126     History  Chief Complaint  Patient presents with   Psychiatric Evaluation    Pt presents to the ED accompanied by [facility] as an IVC. Pt handcuffed, cursing, yelling, and aggressive upon arrival to ED. Per IVC paperwork, daughter filed an IVC due to "pt took a lot of pills in an attempt to kill herself. Accusing people of stealing her drugs, pt aggressive and is threatening to injure another's property and cussing at people." Pt uncooperative during triage and not answering questions.     KYRIANNA PETTITT is a 57 y.o. female.  Patient is a 57 year old female with a history of COPD, atrial fibrillation, schizophrenia, recurrent abdominal pain who is being brought in tonight by police for IVC.  Patient's daughter IVC to her because she reports that she is becoming violent at home, told her today that she overdosed on pills and has been aggressive with family talking about destroying property.  Patient reports that she was just sitting at home drinking a beer when the police came into her house and brought her out.  Patient is irate and screaming.  She is refusing all care.  Unable to get any further history.  The history is provided by the patient, the police and medical records.       Home Medications Prior to Admission medications   Medication Sig Start Date End Date Taking? Authorizing Provider  albuterol (VENTOLIN HFA) 108 (90 Base) MCG/ACT inhaler Inhale 1-2 puffs into the lungs every 6 (six) hours as needed for wheezing or shortness of breath. 02/25/23   Sabas Sous, MD  budesonide-formoterol (SYMBICORT) 160-4.5 MCG/ACT inhaler Inhale 2 puffs into the lungs 2 (two) times daily. 02/25/23   Sabas Sous, MD  famotidine (PEPCID) 20 MG tablet Take 1 tablet (20 mg total) by mouth daily. 02/25/23   Sabas Sous, MD  furosemide (LASIX) 20 MG tablet Take 1  tablet (20 mg total) by mouth daily. 02/24/23   Achille Rich, PA-C  hyoscyamine (LEVSIN/SL) 0.125 MG SL tablet Place 1 tablet (0.125 mg total) under the tongue every 4 (four) hours as needed (pain). Up to 1.25 mg daily Patient not taking: Reported on 02/22/2023 01/23/23   Arthor Captain, PA-C  metFORMIN (GLUCOPHAGE-XR) 750 MG 24 hr tablet Take 1 tablet (750 mg total) by mouth daily. 02/25/23 03/27/23  Sabas Sous, MD  ondansetron (ZOFRAN) 4 MG tablet Take 1 tablet (4 mg total) by mouth every 6 (six) hours. Patient not taking: Reported on 02/22/2023 01/20/23   Prosperi, Christian H, PA-C  ondansetron (ZOFRAN-ODT) 4 MG disintegrating tablet Take 1 tablet (4 mg total) by mouth every 8 (eight) hours as needed for nausea or vomiting. 03/01/23   Laurence Spates, MD  OXYGEN Inhale 4-5 L into the lungs as needed (COPD).    [provider]  sucralfate (CARAFATE) 1 g tablet Take 1 tablet (1 g total) by mouth 4 (four) times daily -  with meals and at bedtime. Patient not taking: Reported on 02/22/2023 02/11/23   Horton, Mayer Masker, MD  zonisamide (ZONEGRAN) 100 MG capsule Take 2 capsules (200 mg total) by mouth daily. Patient not taking: Reported on 02/22/2023 01/12/23   Lewie Chamber, MD      Allergies    Iron dextran, Aspirin, and Penicillins    Review of Systems   Review of Systems  Physical Exam  Updated Vital Signs BP 97/65   Pulse 95   Temp 98.5 F (36.9 C) (Oral)   Resp 15   Ht 5\' 2"  (1.575 m)   Wt 56 kg   LMP 01/08/2011   SpO2 96%   BMI 22.58 kg/m  Physical Exam Vitals and nursing note reviewed.  Constitutional:      Comments: Patient is screaming, in handcuffs, lunging towards staff, screaming at the police.  Appears intoxicated  HENT:     Head: Normocephalic.  Musculoskeletal:     Right lower leg: No edema.     Left lower leg: No edema.  Neurological:     Mental Status: She is alert. Mental status is at baseline.  Psychiatric:        Mood and Affect: Affect is labile.         Speech: Speech is rapid and pressured.        Behavior: Behavior is agitated and aggressive.     ED Results / Procedures / Treatments   Labs (all labs ordered are listed, but only abnormal results are displayed) Labs Reviewed  CBC WITH DIFFERENTIAL/PLATELET - Abnormal; Notable for the following components:      Result Value   RBC 3.27 (*)    Hemoglobin 7.5 (*)    HCT 25.5 (*)    MCV 78.0 (*)    MCH 22.9 (*)    MCHC 29.4 (*)    RDW 21.0 (*)    All other components within normal limits  COMPREHENSIVE METABOLIC PANEL - Abnormal; Notable for the following components:   CO2 21 (*)    Glucose, Bld 153 (*)    Calcium 8.3 (*)    Total Protein 6.2 (*)    Albumin 2.8 (*)    Total Bilirubin 0.2 (*)    All other components within normal limits  ETHANOL - Abnormal; Notable for the following components:   Alcohol, Ethyl (B) 193 (*)    All other components within normal limits  ACETAMINOPHEN LEVEL - Abnormal; Notable for the following components:   Acetaminophen (Tylenol), Serum <10 (*)    All other components within normal limits  SALICYLATE LEVEL - Abnormal; Notable for the following components:   Salicylate Lvl <7.0 (*)    All other components within normal limits  RAPID URINE DRUG SCREEN, HOSP PERFORMED    EKG EKG Interpretation Date/Time:  Friday March 06 2023 22:07:29 EDT Ventricular Rate:  89 PR Interval:  132 QRS Duration:  82 QT Interval:  364 QTC Calculation: 443 R Axis:   43  Text Interpretation: Sinus rhythm Biatrial enlargement Nonspecific T abnrm, anterolateral leads No significant change since last tracing Confirmed by Gwyneth Sprout ([id number]) on 03/06/2023 10:21:26 PM  Radiology No results found.  Procedures Procedures    Medications Ordered in ED Medications  haloperidol lactate (HALDOL) injection 5 mg (5 mg Intramuscular Given 03/06/23 2138)  midazolam (VERSED) injection 2 mg (2 mg Intramuscular Given 03/06/23 2137)    ED Course/ Medical Decision  Making/ A&P                                 Medical Decision Making Amount and/or Complexity of Data Reviewed Labs: ordered. Decision-making details documented in ED Course.  Risk Prescription drug management.   Pt with multiple medical problems and comorbidities and presenting today with a complaint that caries a high risk for morbidity and mortality.  Here today with police under IVC due  to her daughter being concerned that she was suicidal and told her that she had taken pills and then also that she has been agitated and threatening to break things and other property as well as hurt people.  Here patient is denying all of this she is screaming at the police, she is not able to be de-escalated.  She is refusing to cooperate.  Given she is here under IVC she was restrained with soft restraints and chemical sedation to ensure that patient has not ingested something that would be dangerous to her.  Past EKG on 02/27/2023 showed no evidence of prolonged QT.  In the past it appears that she has responded to Haldol and Versed which was given IM.  After IM meds pt is much calmer and restraints were discontinued.  She only required them for .  10:41 PM And interpreted as labs and CBC with a hemoglobin of 7.5 today which is unchanged from prior, CMP without acute findings, EtOH today is elevated at 193 acetaminophen and salicylates are negative.  Patient will need to be evaluated by psychiatry once she is fully awake.  At this time she is medically clear for psychiatric evaluation.        Final Clinical Impression(s) / ED Diagnoses Final diagnoses:  Aggressive behavior  Alcoholic intoxication without complication [facility])    Rx / DC Orders ED Discharge Orders     None         Gwyneth Sprout, MD 03/06/23 2241

## 2023-03-06 NOTE — ED Notes (Signed)
Pt presented to the ED IVC'd by a family member. First exam was completed and e-filed. Copies will be with the [facility] zone Diplomatic Services operational officer for now.

## 2023-03-06 NOTE — ED Notes (Signed)
Pt completely undressed by staff. Changed into purple scrubs. All belongings placed in belongings bag. Currently resting with eyes closed. Resp even and unlabored. No acute or resp distress noted. Provider does not want in and out cath done at this time for UDS.

## 2023-03-07 ENCOUNTER — Encounter (HOSPITAL_COMMUNITY): Payer: Self-pay

## 2023-03-07 DIAGNOSIS — F250 Schizoaffective disorder, bipolar type: Secondary | ICD-10-CM | POA: Diagnosis not present

## 2023-03-07 MED ORDER — ONDANSETRON HCL 4 MG PO TABS
4.0000 mg | ORAL_TABLET | Freq: Four times a day (QID) | ORAL | Status: DC
  Administered 2023-03-07 – 2023-03-16 (×29): 4 mg via ORAL
  Filled 2023-03-07 (×29): qty 1

## 2023-03-07 MED ORDER — THIAMINE MONONITRATE 100 MG PO TABS
100.0000 mg | ORAL_TABLET | Freq: Every day | ORAL | Status: DC
  Administered 2023-03-07 – 2023-03-16 (×10): 100 mg via ORAL
  Filled 2023-03-07 (×11): qty 1

## 2023-03-07 MED ORDER — METFORMIN HCL ER 750 MG PO TB24
750.0000 mg | ORAL_TABLET | Freq: Every day | ORAL | Status: DC
  Administered 2023-03-08 – 2023-03-13 (×6): 750 mg via ORAL
  Filled 2023-03-07 (×7): qty 1

## 2023-03-07 MED ORDER — THIAMINE HCL 100 MG/ML IJ SOLN: 100.0000 mg | Freq: Every day | INTRAMUSCULAR | Status: DC

## 2023-03-07 MED ORDER — ARIPIPRAZOLE 5 MG PO TABS
5.0000 mg | ORAL_TABLET | Freq: Every day | ORAL | Status: DC
  Administered 2023-03-07 – 2023-03-09 (×3): 5 mg via ORAL
  Filled 2023-03-07 (×3): qty 1

## 2023-03-07 MED ORDER — LORAZEPAM 2 MG/ML IJ SOLN
1.0000 mg | Freq: Once | INTRAMUSCULAR | Status: AC
  Administered 2023-03-07: 1 mg via INTRAMUSCULAR
  Filled 2023-03-07: qty 1

## 2023-03-07 MED ORDER — FOLIC ACID 1 MG PO TABS
1.0000 mg | ORAL_TABLET | Freq: Every day | ORAL | Status: DC
  Administered 2023-03-07 – 2023-03-16 (×10): 1 mg via ORAL
  Filled 2023-03-07 (×11): qty 1

## 2023-03-07 MED ORDER — GABAPENTIN 100 MG PO CAPS
200.0000 mg | ORAL_CAPSULE | Freq: Three times a day (TID) | ORAL | Status: DC
  Administered 2023-03-07 – 2023-03-08 (×4): 200 mg via ORAL
  Filled 2023-03-07 (×4): qty 2

## 2023-03-07 MED ORDER — ACETAMINOPHEN 325 MG PO TABS
650.0000 mg | ORAL_TABLET | Freq: Four times a day (QID) | ORAL | Status: DC | PRN
  Administered 2023-03-07 – 2023-03-16 (×26): 650 mg via ORAL
  Filled 2023-03-07 (×27): qty 2

## 2023-03-07 MED ORDER — LORAZEPAM 1 MG PO TABS
1.0000 mg | ORAL_TABLET | ORAL | Status: AC | PRN
  Administered 2023-03-08 (×2): 1 mg via ORAL
  Administered 2023-03-09 – 2023-03-10 (×2): 2 mg via ORAL
  Filled 2023-03-07: qty 1
  Filled 2023-03-07 (×2): qty 2
  Filled 2023-03-07: qty 1

## 2023-03-07 MED ORDER — NICOTINE 21 MG/24HR TD PT24
21.0000 mg | MEDICATED_PATCH | Freq: Once | TRANSDERMAL | Status: AC
  Administered 2023-03-07: 21 mg via TRANSDERMAL
  Filled 2023-03-07: qty 1

## 2023-03-07 MED ORDER — ZIPRASIDONE MESYLATE 20 MG IM SOLR
20.0000 mg | Freq: Once | INTRAMUSCULAR | Status: AC
  Administered 2023-03-07: 20 mg via INTRAMUSCULAR
  Filled 2023-03-07: qty 20

## 2023-03-07 MED ORDER — SUCRALFATE 1 G PO TABS
1.0000 g | ORAL_TABLET | Freq: Three times a day (TID) | ORAL | Status: DC
  Administered 2023-03-07 – 2023-03-16 (×33): 1 g via ORAL
  Filled 2023-03-07 (×33): qty 1

## 2023-03-07 MED ORDER — STERILE WATER FOR INJECTION IJ SOLN
INTRAMUSCULAR | Status: AC
  Administered 2023-03-07: 10 mL
  Filled 2023-03-07: qty 10

## 2023-03-07 NOTE — ED Notes (Signed)
Reached out to [employer] with behavioral health and informed her that pt was awake and alert and ready for assessment. Chat indicating message was read, but she never responded. Pt becoming slightly anxious and agitated. Continues to have to be redirected.

## 2023-03-07 NOTE — ED Notes (Signed)
Pt is currently asleep. No agitation noted at this time. 1:1 with sitter continued. Safety precautions maintained.

## 2023-03-07 NOTE — ED Notes (Signed)
IVC paperwork on green zone

## 2023-03-07 NOTE — Consult Note (Signed)
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   POA agrees with recommendations.   Medications: -CIWA ativan protocol -Gabapentin 200mg  po TID or alcohol use disorder and chronic pain -Abilify 5mg  po daily for mood stability; if she tolerates well, can consider restarting LAI.   Per chart review, patient used to take Abilify Mainetena 400 mg from 2020 until Aug 23.  At previous hospital admission, psychiatry discussed changing olanzapine to invega to convert to monthly invega. However, pt reported "allergic reaction" to invega in the past so this was not done.   Daily contact with patient to assess and evaluate symptoms and progress in treatment and Medication management  Disposition: Recommend psychiatric Inpatient admission when medically cleared.  This service was provided via telemedicine using a 2-way, interactive audio and video technology.  Names of all persons participating in this telemedicine service and their role  in this encounter. Name: [name] Role: Patient  Name: [name] Dargan Role: Pt's daughter and POA  Name: Ophelia Shoulder Role: PMHNP  Name: Marti Sleigh Role: Psychiatrist    Chales Abrahams, NP 03/07/2023 2:03 PM

## 2023-03-07 NOTE — ED Notes (Signed)
TTS notified patient is alert and able to talk at this time.

## 2023-03-07 NOTE — Progress Notes (Signed)
CSW received call from patient's daughter [name] who states she had to IVC patient last night due to being aggressive and under the influence of alcohol. [name] states she is fearful that if patient is discharged she will harm herself or someone else. [name] requesting she be contacted for all updates and questions regarding patient.  CSW sent message to RN to inform her of information.  Edwin Dada, MSW, LCSW Transitions of Care  Clinical Social Worker II [phone]

## 2023-03-07 NOTE — ED Notes (Signed)
Pt abruptly removed herself from stretcher, pacing throughout the department, irate. Pt asking for her clothes and her other belongings. Stating she is leaving. Informed pt she is not allowed to leave as of now as we are still pending her psych assessment. Pt then starts to ambulate out of the department. Security made aware and stopped pt in ED lobby. Pt redirected back to room. As we are ambulating back to room pt cursing and yelling at staff. Provider made aware for additional meds.

## 2023-03-07 NOTE — ED Notes (Signed)
Pt ate breakfast and took meds with no problems. Went back to sleep at this time. 1:1 continued with safety sitter at bedside. Pt was cooperative and observed to be calm at this time. Will continue to monitor.

## 2023-03-07 NOTE — ED Notes (Signed)
Pt care taken, wanting her nicotine patch, alert and oriented x 4, no other complaints at this time.

## 2023-03-07 NOTE — Progress Notes (Signed)
Patient has been denied due to acuity. Patient meets [facility] inpatient criteria per Ophelia Shoulder, NP. Patient has been faxed out to the following facilities:   [facility] Pending - Request [address] [place]., [place] [phone]--ZDGUY-QIHK [facility] Pending - Request Sent--[address], [place] [phone]--AYTKZ-SWFUX [facility] Pending - Request Sent--[address] [place] [phone]--DVVOH-YWVPXTGG [facility] Pending - Request [address] [place] [phone]--JIRCV-ELFYB [facility] Pending - Request [address] [place] [phone]--XVQMG-QQP [facility] Pending - Request Sent--[address] [place] [phone]--NLZJQ-BHALP [facility] Pending - Request [address] [place] [phone]--QIWLN-LGXQJ [facility] Pending - Request Sent--[address] [place] [phone]--ZCHYI-FOYDXA Health-Behavioral Health Patient Placement Pending - Request Sent--[facility] [phone]--[facility] Pending - Request [address] [place], [place] [phone]--SFKCL-EXNTZGYF [facility] Pending - Request Sent--[address] [place] [phone]--XBLTJ-QZES [facility] Pending - Request Sent--[address] [place] [phone]--TDSKA-JGOTLXB [facility] Pending - Request Sent--[address] [place] [facility] [phone]--[facility] Pending - Request [address] [place] [phone]--  Damita Dunnings, MSW, LCSW-A  2:15 PM 03/07/2023

## 2023-03-07 NOTE — ED Provider Notes (Signed)
Emergency Medicine Observation Re-evaluation Note  [name] is a 57 y.o. female, seen on rounds today.  Pt initially presented to the ED for complaints of Psychiatric Evaluation (Pt presents to the ED accompanied by [facility] PD as an IVC. Pt handcuffed, cursing, yelling, and aggressive upon arrival to ED. Per IVC paperwork, daughter filed an IVC due to "pt took a lot of pills in an attempt to kill herself. Accusing people of stealing her drugs, pt aggressive and is threatening to injure another's property and cussing at people." Pt uncooperative during triage and not answering questions. ) Currently, the patient is sleeping.  Physical Exam  BP 124/70   Pulse (!) 105   Temp (!) 97.3 F (36.3 C) (Axillary)   Resp 15   Ht 5\' 2"  (1.575 m)   Wt 56 kg   LMP 01/08/2011   SpO2 100%   BMI 22.58 kg/m  Physical Exam General: Sleeping Cardiac: Extremities well-perfused Lungs: Breathing is unlabored Psych: Deferred  ED Course / MDM  EKG:EKG Interpretation Date/Time:  Friday March 06 2023 22:07:29 EDT Ventricular Rate:  89 PR Interval:  132 QRS Duration:  82 QT Interval:  364 QTC Calculation: 443 R Axis:   43  Text Interpretation: Sinus rhythm Biatrial enlargement Nonspecific T abnrm, anterolateral leads No significant change since last tracing Confirmed by Gwyneth Sprout ([id number]) on 03/06/2023 10:21:26 PM  I have reviewed the labs performed to date as well as medications administered while in observation.  Recent changes in the last 24 hours include presentation to the emergency department under IVC, filled out by daughter, due to violent behavior at home and report of suicide attempt by overdose on pills.  She has been medically cleared.  She is currently awaiting TTS evaluation.  Plan  Current plan is for TTS evaluation.    Gloris Manchester, MD 03/07/23 1226

## 2023-03-07 NOTE — BH Assessment (Signed)
TTS attempted to assess pt. Marcello Moores, RN informed this clinician that pt is too somnolent  for an evaluation. TTS will be informed when pt is alert and awake for an assessment.

## 2023-03-07 NOTE — ED Notes (Signed)
IVC paperwork on purple zone

## 2023-03-08 DIAGNOSIS — Z7984 Long term (current) use of oral hypoglycemic drugs: Secondary | ICD-10-CM | POA: Diagnosis not present

## 2023-03-08 DIAGNOSIS — F250 Schizoaffective disorder, bipolar type: Secondary | ICD-10-CM | POA: Diagnosis not present

## 2023-03-08 DIAGNOSIS — F17210 Nicotine dependence, cigarettes, uncomplicated: Secondary | ICD-10-CM | POA: Diagnosis not present

## 2023-03-08 DIAGNOSIS — E039 Hypothyroidism, unspecified: Secondary | ICD-10-CM | POA: Diagnosis not present

## 2023-03-08 DIAGNOSIS — E119 Type 2 diabetes mellitus without complications: Secondary | ICD-10-CM | POA: Diagnosis not present

## 2023-03-08 DIAGNOSIS — Z96641 Presence of right artificial hip joint: Secondary | ICD-10-CM | POA: Diagnosis not present

## 2023-03-08 DIAGNOSIS — Z79899 Other long term (current) drug therapy: Secondary | ICD-10-CM | POA: Diagnosis not present

## 2023-03-08 DIAGNOSIS — D62 Acute posthemorrhagic anemia: Secondary | ICD-10-CM | POA: Diagnosis not present

## 2023-03-08 DIAGNOSIS — E538 Deficiency of other specified B group vitamins: Secondary | ICD-10-CM | POA: Diagnosis not present

## 2023-03-08 DIAGNOSIS — E871 Hypo-osmolality and hyponatremia: Secondary | ICD-10-CM | POA: Diagnosis not present

## 2023-03-08 DIAGNOSIS — I4891 Unspecified atrial fibrillation: Secondary | ICD-10-CM | POA: Diagnosis not present

## 2023-03-08 DIAGNOSIS — I10 Essential (primary) hypertension: Secondary | ICD-10-CM | POA: Diagnosis not present

## 2023-03-08 DIAGNOSIS — J449 Chronic obstructive pulmonary disease, unspecified: Secondary | ICD-10-CM | POA: Diagnosis not present

## 2023-03-08 MED ORDER — MIDAZOLAM HCL 2 MG/2ML IJ SOLN
2.0000 mg | Freq: Once | INTRAMUSCULAR | Status: AC
  Administered 2023-03-08: 2 mg via INTRAMUSCULAR
  Filled 2023-03-08: qty 2

## 2023-03-08 MED ORDER — NICOTINE 14 MG/24HR TD PT24: 14.0000 mg | MEDICATED_PATCH | Freq: Every day | TRANSDERMAL | Status: DC

## 2023-03-08 MED ORDER — DIPHENHYDRAMINE HCL 50 MG/ML IJ SOLN
25.0000 mg | Freq: Once | INTRAMUSCULAR | Status: AC
  Administered 2023-03-08: 25 mg via INTRAMUSCULAR
  Filled 2023-03-08: qty 1

## 2023-03-08 MED ORDER — LORAZEPAM 1 MG PO TABS
1.0000 mg | ORAL_TABLET | ORAL | Status: AC | PRN
  Administered 2023-03-09: 1 mg via ORAL
  Filled 2023-03-08: qty 1

## 2023-03-08 MED ORDER — ZIPRASIDONE MESYLATE 20 MG IM SOLR
20.0000 mg | Freq: Two times a day (BID) | INTRAMUSCULAR | Status: DC | PRN
  Administered 2023-03-09 – 2023-03-11 (×3): 20 mg via INTRAMUSCULAR
  Filled 2023-03-08 (×3): qty 20

## 2023-03-08 MED ORDER — GABAPENTIN 300 MG PO CAPS
300.0000 mg | ORAL_CAPSULE | Freq: Three times a day (TID) | ORAL | Status: DC
  Administered 2023-03-08 – 2023-03-16 (×24): 300 mg via ORAL
  Filled 2023-03-08 (×2): qty 1
  Filled 2023-03-08: qty 3
  Filled 2023-03-08 (×7): qty 1
  Filled 2023-03-08: qty 3
  Filled 2023-03-08 (×14): qty 1

## 2023-03-08 MED ORDER — NICOTINE 21 MG/24HR TD PT24
21.0000 mg | MEDICATED_PATCH | Freq: Every day | TRANSDERMAL | Status: DC
  Administered 2023-03-08 – 2023-03-16 (×10): 21 mg via TRANSDERMAL
  Filled 2023-03-08 (×11): qty 1

## 2023-03-08 MED ORDER — HALOPERIDOL LACTATE 5 MG/ML IJ SOLN
5.0000 mg | Freq: Once | INTRAMUSCULAR | Status: AC
  Administered 2023-03-08: 5 mg via INTRAMUSCULAR
  Filled 2023-03-08: qty 1

## 2023-03-08 NOTE — Progress Notes (Signed)
Patient has been denied by [facility] due to no appropriate beds available. Patient meets [facility] inpatient criteria per Ophelia Shoulder, NP. Patient has been faxed out to the following facilities:   [facility]  [address] [place] [place] [address] [phone]  [facility]  [address] [place] [address] [phone]  [facility]  [address] [place] [address] [phone]  [facility]  [address] [place] [address] [phone]  [facility]  [address] [place] [address] [phone]  [facility]  [address] [place] [address] [phone]  [facility]  [address], [place] [address] [phone]  [facility]  [address] [place] [address] [phone]  [facility] Patient Placement  [facility], [place] [phone]  [facility]  [address] [place], [place] [address] [phone]  [facility]  [address] [place], [place] [address] [phone]  [facility]  [address] [place] [address] [phone]  [facility]  [address] [place] [address] [phone]  [facility]  [address] [place] [address] [phone]   Damita Dunnings, MSW, LCSW-A  1:19 PM 03/08/2023

## 2023-03-08 NOTE — ED Provider Notes (Signed)
Emergency Medicine Observation Re-evaluation Note  [name] is a 57 y.o. female, seen on rounds today.  Pt initially presented to the ED for complaints of Psychiatric Evaluation (Pt presents to the ED accompanied by [facility] PD as an IVC. Pt handcuffed, cursing, yelling, and aggressive upon arrival to ED. Per IVC paperwork, daughter filed an IVC due to "pt took a lot of pills in an attempt to kill herself. Accusing people of stealing her drugs, pt aggressive and is threatening to injure another's property and cussing at people." Pt uncooperative during triage and not answering questions. ) Currently, the patient is standing in room.  Physical Exam  BP 132/72 (BP Location: Right Arm)   Pulse 97   Temp 98.7 F (37.1 C) (Oral)   Resp 16   Ht 5\' 2"  (1.575 m)   Wt 56 kg   LMP 01/08/2011   SpO2 97%   BMI 22.58 kg/m  Physical Exam General: Awake, alert, nondistressed Cardiac: Extremities well-perfused Lungs: Breathing is unlabored Psych: No agitation  ED Course / MDM  EKG:EKG Interpretation Date/Time:  Friday March 06 2023 22:07:29 EDT Ventricular Rate:  89 PR Interval:  132 QRS Duration:  82 QT Interval:  364 QTC Calculation: 443 R Axis:   43  Text Interpretation: Sinus rhythm Biatrial enlargement Nonspecific T abnrm, anterolateral leads No significant change since last tracing Confirmed by Gwyneth Sprout ([id number]) on 03/06/2023 10:21:26 PM  I have reviewed the labs performed to date as well as medications administered while in observation.  Recent changes in the last 24 hours include evaluation by TTS who recommends inpatient psychiatric admission.  Plan  Current plan is for inpatient psychiatric admission.    Gloris Manchester, MD 03/08/23 [phone]

## 2023-03-08 NOTE — ED Notes (Signed)
Patient agitated and went out the door. Security was called she was given Haldol 5 mg IM at 1538, Versed 2 mg IM  at 1546 and Benadryl 25 mg IM at 1548. Rn told patient we need to monitor her saturation. Per patient no "I' m going break it"

## 2023-03-08 NOTE — ED Notes (Signed)
Patient took off her nicotine patch

## 2023-03-08 NOTE — ED Notes (Signed)
Patient woke up and wanted crackers. I told her that it was another couple of hours until morning and breakfast. She demanded to have her belongings and to get out of here. She walked off of the unit with [place] in tow, met the security at the bridge and escorted the patient back to her room.

## 2023-03-09 DIAGNOSIS — F1020 Alcohol dependence, uncomplicated: Secondary | ICD-10-CM | POA: Diagnosis not present

## 2023-03-09 DIAGNOSIS — Z79899 Other long term (current) drug therapy: Secondary | ICD-10-CM | POA: Diagnosis not present

## 2023-03-09 DIAGNOSIS — Z7984 Long term (current) use of oral hypoglycemic drugs: Secondary | ICD-10-CM | POA: Diagnosis not present

## 2023-03-09 DIAGNOSIS — I4891 Unspecified atrial fibrillation: Secondary | ICD-10-CM | POA: Diagnosis not present

## 2023-03-09 DIAGNOSIS — E871 Hypo-osmolality and hyponatremia: Secondary | ICD-10-CM | POA: Diagnosis not present

## 2023-03-09 DIAGNOSIS — Z96641 Presence of right artificial hip joint: Secondary | ICD-10-CM | POA: Diagnosis not present

## 2023-03-09 DIAGNOSIS — E119 Type 2 diabetes mellitus without complications: Secondary | ICD-10-CM | POA: Diagnosis not present

## 2023-03-09 DIAGNOSIS — D62 Acute posthemorrhagic anemia: Secondary | ICD-10-CM | POA: Diagnosis not present

## 2023-03-09 DIAGNOSIS — F1994 Other psychoactive substance use, unspecified with psychoactive substance-induced mood disorder: Secondary | ICD-10-CM | POA: Diagnosis not present

## 2023-03-09 DIAGNOSIS — I10 Essential (primary) hypertension: Secondary | ICD-10-CM | POA: Diagnosis not present

## 2023-03-09 DIAGNOSIS — F250 Schizoaffective disorder, bipolar type: Secondary | ICD-10-CM

## 2023-03-09 DIAGNOSIS — F17210 Nicotine dependence, cigarettes, uncomplicated: Secondary | ICD-10-CM | POA: Diagnosis not present

## 2023-03-09 DIAGNOSIS — E039 Hypothyroidism, unspecified: Secondary | ICD-10-CM | POA: Diagnosis not present

## 2023-03-09 DIAGNOSIS — J449 Chronic obstructive pulmonary disease, unspecified: Secondary | ICD-10-CM | POA: Diagnosis not present

## 2023-03-09 DIAGNOSIS — E538 Deficiency of other specified B group vitamins: Secondary | ICD-10-CM | POA: Diagnosis not present

## 2023-03-09 MED ORDER — STERILE WATER FOR INJECTION IJ SOLN
INTRAMUSCULAR | Status: AC
  Filled 2023-03-09: qty 10

## 2023-03-09 MED ORDER — ALBUTEROL SULFATE HFA 108 (90 BASE) MCG/ACT IN AERS
2.0000 | INHALATION_SPRAY | Freq: Four times a day (QID) | RESPIRATORY_TRACT | Status: DC | PRN
  Administered 2023-03-09 – 2023-03-14 (×6): 2 via RESPIRATORY_TRACT
  Filled 2023-03-09 (×2): qty 6.7

## 2023-03-09 MED ORDER — ARIPIPRAZOLE 10 MG PO TABS
10.0000 mg | ORAL_TABLET | Freq: Every day | ORAL | Status: DC
  Administered 2023-03-10 – 2023-03-16 (×6): 10 mg via ORAL
  Filled 2023-03-09 (×7): qty 1

## 2023-03-09 NOTE — ED Notes (Signed)
Pt has now stripped out of her scrubs and is naked and refusing to be covered by staff. Pt ambulated to bathroom by security and off-duty [employer]. Pt then came out of bathroom and said she was going to "shit" in her room.

## 2023-03-09 NOTE — ED Notes (Signed)
Pt ambulatory to restroom.  Appears to be in good mood at this time.

## 2023-03-09 NOTE — Progress Notes (Signed)
LCSW Progress Note  [id number]   [place]  03/09/2023  10:08 AM  Description:   Inpatient Psychiatric Referral  Patient was recommended inpatient per Eligha Bridegroom, NP. There are no available beds at [facility], per [facility] Rona Ravens, RN. Patient was referred to the following out of network facilities:   Destination  Service Provider Address Phone Fax  [facility]  [address] [place], [place] [address] [phone]  [facility]  [address] [place] [address] [phone]  [facility]  [address] [place] [address] [phone]  [facility]  [address] [place] [address] [phone]  [facility]  [address] [place] [address] [phone]  [facility] Patient Placement  [facility], [place] [phone]  [facility]  [address] [place], [place] [address] [phone]  [facility]  [address] [place], [place] [address] [phone]  [facility]  [address] [place] [address] [phone]  [facility]  [address], [place] [address] [phone]  [facility]  [address], [place] [address] [phone]  [facility]  [address], [place] [address] [phone]  [facility]  [address] [place], [place] [address] [phone]  [facility]  [address], [place] [address] [phone]  [facility]  [address] [place]  [address] [phone]  [facility]  [address] [place] [address] [phone]  [facility]  [address], [place] [address] [phone]  [facility]  [place] [phone]  [facility]FAX  [address] [place], [place] [phone]  [facility]  [address], [place] [address] [phone]  [facility]  [address], [place] [address] [phone]  [facility]  [address], [place] [address] [phone]  [facility]  [place] [phone]  [facility]  [address] [place] [address] [phone]  [facility] [address]  [place] [address] [phone]    Situation ongoing, CSW to continue following and update chart as more information becomes available.      Cathie Beams, LCSW  03/09/2023 10:08 AM

## 2023-03-09 NOTE — ED Notes (Signed)
Pt escalated d/t being told her night meds cannot be given early. Pt walked out of purple into green zone and was escorted back to her room by security and off-duty [employer]. Pt then began throwing things in her room. Pt cussing at security and off-duty [employer]. Night RN going to get pt prn meds while this RN stays in purple observing the situation and other pts.

## 2023-03-09 NOTE — ED Notes (Signed)
Pt expressing frustration with having to be here waiting for inpatient psych tx placement. Pt does not remember talking to the [facility] NP this morning. Pt having difficulty comprehending that we don't do a 3 day hold here. This RN tried to explain the process, but pt having difficulty understanding it. Pt denies needing inpatient psych tx. Pt says her medications are upsetting her stomach and making her hallucinate. Pt verbally deescalated and pt ambulated back to room.

## 2023-03-09 NOTE — ED Notes (Signed)
Pt received for care at 1900.  At this time, pt sitting in chair outside room.  Pt in view of nurse station and sitter with necessary precautions maintained.

## 2023-03-09 NOTE — ED Notes (Signed)
Pt asking for soda at this time.  This RN offered pt water instead; pt agreeable and provided with cup of water.

## 2023-03-09 NOTE — ED Notes (Signed)
IVC PAPERWORK CURRENT

## 2023-03-09 NOTE — ED Notes (Signed)
IVC'ED CALLED COURT OF CLERK TO RETREIVE CASE # [id number]

## 2023-03-09 NOTE — ED Provider Notes (Signed)
Emergency Medicine Observation Re-evaluation Note  [name] is a 57 y.o. female, seen on rounds today.  Pt initially presented to the ED for complaints of Psychiatric Evaluation (Pt presents to the ED accompanied by [facility] PD as an IVC. Pt handcuffed, cursing, yelling, and aggressive upon arrival to ED. Per IVC paperwork, daughter filed an IVC due to "pt took a lot of pills in an attempt to kill herself. Accusing people of stealing her drugs, pt aggressive and is threatening to injure another's property and cussing at people." Pt uncooperative during triage and not answering questions. ) Currently, the patient is alert, active, happily interacting with staff.  Physical Exam  BP 114/78 (BP Location: Left Arm)   Pulse 96   Temp 98.5 F (36.9 C) (Oral)   Resp 20   Ht 5\' 2"  (1.575 m)   Wt 56 kg   LMP 01/08/2011   SpO2 96%   BMI 22.58 kg/m  Physical Exam General: NAD Cardiac: well perfused Lungs: even and unlabored Psych: no agitation  ED Course / MDM  EKG:EKG Interpretation Date/Time:  Friday March 06 2023 22:07:29 EDT Ventricular Rate:  89 PR Interval:  132 QRS Duration:  82 QT Interval:  364 QTC Calculation: 443 R Axis:   43  Text Interpretation: Sinus rhythm Biatrial enlargement Nonspecific T abnrm, anterolateral leads No significant change since last tracing Confirmed by Gwyneth Sprout ([id number]) on 03/06/2023 10:21:26 PM  I have reviewed the labs performed to date as well as medications administered while in observation.  Recent changes in the last 24 hours include none, pt remains under IVC, meets inpatient criteria per psychiatry.  Plan  Current plan is for inpatient psychiatric admission.    Ernie Avena, MD 03/09/23 [phone]

## 2023-03-09 NOTE — ED Notes (Signed)
Pt calmer at this time; requested to speak with this RN.  This RN stepped into room and pt inquired about her medications again.  This RN explained which medications were ordered and reminded pt these medications could not be given yet.  This RN reassured pt that he would administer these medications to her as soon as he could.

## 2023-03-09 NOTE — Progress Notes (Signed)
[facility] Psych ED Progress Note  03/09/2023 11:31 AM [name]  MRN:  [id number]   Subjective:   Patient seen this morning at [facility] for face to face psychiatric reevaluation. Pt is irritable, ruminating about why she does not need to be in the hospital. Pt continues to state her petition for IVC is all lies and she never did those things. Pt continues to lack any insight or judgement. Continues to be paranoid, specifically about one daughter. She at times is uncooperative. Will continue to recommend IVC and inpatient psychiatric treatment.   Principal Problem: Schizoaffective disorder, bipolar type (HCC) Diagnosis:  Principal Problem:   Schizoaffective disorder, bipolar type (HCC) Active Problems:   Substance induced mood disorder (HCC)   Alcohol use disorder, severe, dependence (HCC)   ED Assessment Time Calculation: Start Time: 1100 Stop Time: 1130 Total Time in Minutes (Assessment Completion): 30   Past Psychiatric History: see previous documentation  [place] Scale:  Flowsheet Row ED from 03/06/2023 in [facility] Emergency Department at [facility] ED from 03/01/2023 in [facility] Emergency Department at [facility] ED from 02/27/2023 in [facility] Emergency Department at [facility]  C-SSRS RISK CATEGORY No Risk No Risk No Risk       Past Medical History:  Past Medical History:  Diagnosis Date   Abdominal pain    Accidental drug overdose April 2013   Anxiety    Atrial fibrillation (HCC) 09/29/11   converted spontaneously   Chronic back pain    Chronic knee pain    Chronic nausea    Chronic pain    COPD (chronic obstructive pulmonary disease) (HCC)    Depression    Diabetes mellitus    states her doctor took her off all DM meds in past month   Diabetic neuropathy (HCC)    Dyspnea    with exertion    GERD (gastroesophageal reflux disease)    Headache(784.0)    migraines    HTN (hypertension)    not on meds since in a year    Hyperlipidemia     Hypothyroidism    not on meds in a while    Mental disorder    Bipolar and schizophrenic   Nausea and vomiting 01/02/2023   Requires supplemental oxygen    as needed per patient    Schizophrenia (HCC)    Schizophrenia, acute (HCC) 11/13/2017   Tobacco abuse     Past Surgical History:  Procedure Laterality Date   ABDOMINAL HYSTERECTOMY     BLADDER SUSPENSION  03/04/2011   Procedure: [facility] PROCEDURE;  Surgeon: Jonna Coup MacDiarmid;  Location: [facility] ORS;  Service: Urology;  Laterality: N/A;   BOWEL RESECTION N/A 04/18/2022   Procedure: SMALL BOWEL RESECTION;  Surgeon: Harriette Bouillon, MD;  Location: [facility] OR;  Service: General;  Laterality: N/A;   CYSTOCELE REPAIR  03/04/2011   Procedure: ANTERIOR REPAIR (CYSTOCELE);  Surgeon: Martina Sinner;  Location: [facility];  Service: Urology;  Laterality: N/A;   CYSTOSCOPY  03/04/2011   Procedure: CYSTOSCOPY;  Surgeon: Jonna Coup MacDiarmid;  Location: [facility];  Service: Urology;  Laterality: N/A;   ESOPHAGOGASTRODUODENOSCOPY (EGD) WITH PROPOFOL N/A 05/12/2017   Procedure: ESOPHAGOGASTRODUODENOSCOPY (EGD) WITH PROPOFOL;  Surgeon: Ovidio Kin, MD;  [facility];  Service: General;  Laterality: N/A;   GASTRIC ROUX-EN-Y N/A 03/25/2016   Procedure: LAPAROSCOPIC ROUX-EN-Y GASTRIC BYPASS WITH UPPER ENDOSCOPY;  Surgeon: Glenna Fellows, MD;  Location: [facility];  Service: General;  Laterality: N/A;   KNEE SURGERY  LAPAROSCOPIC ASSISTED VAGINAL HYSTERECTOMY  03/04/2011   Procedure: LAPAROSCOPIC ASSISTED VAGINAL HYSTERECTOMY;  Surgeon: Jeani Hawking, MD;  Location: [facility];  Service: Gynecology;  Laterality: N/A;   LAPAROTOMY N/A 04/18/2022   Procedure: EXPLORATORY LAPAROTOMY;  Surgeon: Harriette Bouillon, MD;  Location: [facility] OR;  Service: General;  Laterality: N/A;   LAPAROTOMY N/A 04/24/2022   Procedure: BRING BACK EXPLORATORY LAPAROTOMY;  Surgeon: Violeta Gelinas, MD;  Location: [facility] OR;  Service: General;  Laterality: N/A;   TOTAL HIP ARTHROPLASTY Right  08/27/2022   Procedure: TOTAL HIP ARTHROPLASTY;  Surgeon: Joen Laura, MD;  Location: [facility] OR;  Service: Orthopedics;  Laterality: Right;   Family History:  Family History  Problem Relation Age of Onset   Heart attack Father        66s   Diabetes Mother    Heart disease Mother    Hypertension Mother    Heart attack Sister        36   COPD Other    Breast cancer Neg Hx    Social History:  Social History   Substance and Sexual Activity  Alcohol Use Yes   Comment: 40 ounces daily     Social History   Substance and Sexual Activity  Drug Use Yes   Types: Cocaine, Marijuana, "Crack" cocaine    Social History   Socioeconomic History   Marital status: Legally Separated    Spouse name: Not on file   Number of children: Not on file   Years of education: Not on file   Highest education level: Not on file  Occupational History   Not on file  Tobacco Use   Smoking status: Every Day    Current packs/day: 1.00    Types: Cigarettes   Smokeless tobacco: Never  Vaping Use   Vaping status: Never Used  Substance and Sexual Activity   Alcohol use: Yes    Comment: 40 ounces daily   Drug use: Yes    Types: Cocaine, Marijuana, "Crack" cocaine   Sexual activity: Yes    Partners: Male  Other Topics Concern   Not on file  Social History Narrative   Not on file   Social Determinants of Health   Financial Resource Strain: Not on file  Food Insecurity: No Food Insecurity (02/22/2023)   Hunger Vital Sign    Worried About Running Out of Food in the Last Year: Never true    Ran Out of Food in the Last Year: Never true  Transportation Needs: No Transportation Needs (02/22/2023)   PRAPARE - Administrator, Civil Service (Medical): No    Lack of Transportation (Non-Medical): No  Physical Activity: Not on file  Stress: Not on file  Social Connections: Unknown (01/06/2023)   Received from [facility]   Social Network    Social Network: Not on file    Sleep:  Fair  Appetite:  Fair  Current Medications: Current Facility-Administered Medications  Medication Dose Route Frequency Provider Last Rate Last Admin   acetaminophen (TYLENOL) tablet 650 mg  650 mg Oral Q6H PRN Rancour, Jeannett Senior, MD   650 mg at 03/09/23 0928   albuterol (PROVENTIL) (2.5 MG/3ML) 0.083% nebulizer solution 2.5 mg  2.5 mg Nebulization Q6H PRN Gwyneth Sprout, MD   2.5 mg at 03/07/23 1801   ARIPiprazole (ABILIFY) tablet 5 mg  5 mg Oral Daily Ophelia Shoulder E, NP   5 mg at 03/09/23 0928   famotidine (PEPCID) tablet 20 mg  20 mg Oral Daily Plunkett, Vivian,  MD   20 mg at 03/09/23 0928   fluticasone furoate-vilanterol (BREO ELLIPTA) 200-25 MCG/ACT 1 puff  1 puff Inhalation Daily Gwyneth Sprout, MD   1 puff at 03/09/23 0930   folic acid (FOLVITE) tablet 1 mg  1 mg Oral Daily Ophelia Shoulder E, NP   1 mg at 03/09/23 2536   furosemide (LASIX) tablet 20 mg  20 mg Oral Daily Gwyneth Sprout, MD   20 mg at 03/09/23 6440   gabapentin (NEURONTIN) capsule 300 mg  300 mg Oral TID Ophelia Shoulder E, NP   300 mg at 03/09/23 3474   LORazepam (ATIVAN) tablet 1-4 mg  1-4 mg Oral Q1H PRN Ophelia Shoulder E, NP   2 mg at 03/09/23 0430   metFORMIN (GLUCOPHAGE-XR) 24 hr tablet 750 mg  750 mg Oral Q breakfast Ophelia Shoulder E, NP   750 mg at 03/09/23 2595   nicotine (NICODERM CQ - dosed in mg/24 hours) patch 21 mg  21 mg Transdermal Daily Gloris Manchester, MD   21 mg at 03/09/23 0928   ondansetron (ZOFRAN) tablet 4 mg  4 mg Oral Q6H Ophelia Shoulder E, NP   4 mg at 03/09/23 1123   sucralfate (CARAFATE) tablet 1 g  1 g Oral TID WC & HS Ophelia Shoulder E, NP   1 g at 03/09/23 1123   thiamine (VITAMIN B1) tablet 100 mg  100 mg Oral Daily Ophelia Shoulder E, NP   100 mg at 03/09/23 6387   Or   thiamine (VITAMIN B1) injection 100 mg  100 mg Intravenous Daily Ophelia Shoulder E, NP       ziprasidone (GEODON) injection 20 mg  20 mg Intramuscular Q12H PRN Chales Abrahams, NP       Current Outpatient Medications  Medication Sig  Dispense Refill   acetaminophen (TYLENOL) 325 MG tablet Take 650 mg by mouth every 6 (six) hours as needed for mild pain.     albuterol (VENTOLIN HFA) 108 (90 Base) MCG/ACT inhaler Inhale 1-2 puffs into the lungs every 6 (six) hours as needed for wheezing or shortness of breath. 1 each 0   budesonide-formoterol (SYMBICORT) 160-4.5 MCG/ACT inhaler Inhale 2 puffs into the lungs 2 (two) times daily. 10.2 g 2   diclofenac Sodium (VOLTAREN) 1 % GEL Apply 2 g topically 4 (four) times daily. (Patient not taking: Reported on 03/07/2023)     famotidine (PEPCID) 20 MG tablet Take 1 tablet (20 mg total) by mouth daily. (Patient not taking: Reported on 03/07/2023) 30 tablet 0   furosemide (LASIX) 20 MG tablet Take 1 tablet (20 mg total) by mouth daily. (Patient not taking: Reported on 03/07/2023) 2 tablet 0   gabapentin (NEURONTIN) 100 MG capsule Take 100 mg by mouth 3 (three) times daily. (Patient not taking: Reported on 03/07/2023)     hyoscyamine (LEVSIN/SL) 0.125 MG SL tablet Place 1 tablet (0.125 mg total) under the tongue every 4 (four) hours as needed (pain). Up to 1.25 mg daily (Patient not taking: Reported on 02/22/2023) 20 tablet 0   metFORMIN (GLUCOPHAGE-XR) 750 MG 24 hr tablet Take 1 tablet (750 mg total) by mouth daily. (Patient not taking: Reported on 03/07/2023) 30 tablet 0   ondansetron (ZOFRAN) 4 MG tablet Take 1 tablet (4 mg total) by mouth every 6 (six) hours. (Patient not taking: Reported on 02/22/2023) 12 tablet 0   ondansetron (ZOFRAN-ODT) 4 MG disintegrating tablet Take 1 tablet (4 mg total) by mouth every 8 (eight) hours as needed for nausea or vomiting. (  Patient not taking: Reported on 03/07/2023) 8 tablet 0   OXYGEN Inhale 4-5 L into the lungs as needed (COPD).     sucralfate (CARAFATE) 1 g tablet Take 1 tablet (1 g total) by mouth 4 (four) times daily -  with meals and at bedtime. (Patient not taking: Reported on 02/22/2023) 90 tablet 0   tiZANidine (ZANAFLEX) 2 MG tablet Take 2 mg by mouth at  bedtime. (Patient not taking: Reported on 03/07/2023)     zonisamide (ZONEGRAN) 100 MG capsule Take 2 capsules (200 mg total) by mouth daily. (Patient not taking: Reported on 02/22/2023) 30 capsule 3    Lab Results: No results found for this or any previous visit (from the past 48 hour(s)).  Blood Alcohol level:  Lab Results  Component Value Date   ETH 193 (H) 03/06/2023   ETH <10 02/25/2023    Physical Findings:  CIWA:  CIWA-Ar Total: 0 COWS:     Musculoskeletal: Strength & Muscle Tone: within normal limits Gait & Station: normal Patient leans: N/A  Psychiatric Specialty Exam:  Presentation  General Appearance:  Appropriate for Environment  Eye Contact: Fair  Speech: Clear and Coherent  Speech Volume: Normal  Handedness: Right   Mood and Affect  Mood: Anxious; Irritable  Affect: Congruent   Thought Process  Thought Processes: Goal Directed  Descriptions of Associations:Intact  Orientation:Partial  Thought Content:WDL  History of Schizophrenia/Schizoaffective disorder:Yes  Duration of Psychotic Symptoms:Greater than six months  Hallucinations:Hallucinations: None  Ideas of Reference:Paranoia; Delusions  Suicidal Thoughts:Suicidal Thoughts: No  Homicidal Thoughts:Homicidal Thoughts: No   Sensorium  Memory: Immediate Fair; Recent Fair  Judgment: Impaired  Insight: Lacking   Executive Functions  Concentration: Fair  Attention Span: Fair  Recall: [employer] of Knowledge: Fair  Language: Fair   Psychomotor Activity  Psychomotor Activity: Psychomotor Activity: Normal   Assets  Assets: Desire for Improvement; Social Support; Housing   Sleep  Sleep: Sleep: Fair    Physical Exam: Physical Exam Neurological:     Mental Status: She is alert and oriented to person, place, and time.  Psychiatric:        Attention and Perception: Attention normal.        Mood and Affect: Affect is labile.        Speech: Speech  normal.        Behavior: Behavior is agitated.        Thought Content: Thought content is paranoid.    Review of Systems  Psychiatric/Behavioral:  Positive for depression and substance abuse.        Agitation, paranoia    Blood pressure 114/78, pulse 96, temperature 98.5 F (36.9 C), temperature source Oral, resp. rate 20, height 5\' 2"  (1.575 m), weight 56 kg, last menstrual period 01/08/2011, SpO2 96%. Body mass index is 22.58 kg/m.   Medical Decision Making: Pt case reviewed and discussed with Dr. Lucianne Muss. Will continue to recommend IVC and inpatient psychiatric treatment.   Abilify increased to 10 mg daily  Eligha Bridegroom, NP 03/09/2023, 11:31 AM

## 2023-03-09 NOTE — ED Notes (Signed)
Pt calm and pleasant during medication administration.  Lights turned off in room per request; pt states she is going to try to get some rest.

## 2023-03-09 NOTE — ED Notes (Signed)
Update mother please

## 2023-03-10 DIAGNOSIS — F250 Schizoaffective disorder, bipolar type: Secondary | ICD-10-CM | POA: Diagnosis not present

## 2023-03-10 DIAGNOSIS — I4891 Unspecified atrial fibrillation: Secondary | ICD-10-CM | POA: Diagnosis not present

## 2023-03-10 DIAGNOSIS — E039 Hypothyroidism, unspecified: Secondary | ICD-10-CM | POA: Diagnosis not present

## 2023-03-10 DIAGNOSIS — E538 Deficiency of other specified B group vitamins: Secondary | ICD-10-CM | POA: Diagnosis not present

## 2023-03-10 DIAGNOSIS — Z96641 Presence of right artificial hip joint: Secondary | ICD-10-CM | POA: Diagnosis not present

## 2023-03-10 DIAGNOSIS — F17210 Nicotine dependence, cigarettes, uncomplicated: Secondary | ICD-10-CM | POA: Diagnosis not present

## 2023-03-10 DIAGNOSIS — E871 Hypo-osmolality and hyponatremia: Secondary | ICD-10-CM | POA: Diagnosis not present

## 2023-03-10 DIAGNOSIS — J449 Chronic obstructive pulmonary disease, unspecified: Secondary | ICD-10-CM | POA: Diagnosis not present

## 2023-03-10 DIAGNOSIS — I10 Essential (primary) hypertension: Secondary | ICD-10-CM | POA: Diagnosis not present

## 2023-03-10 DIAGNOSIS — Z7984 Long term (current) use of oral hypoglycemic drugs: Secondary | ICD-10-CM | POA: Diagnosis not present

## 2023-03-10 DIAGNOSIS — Z79899 Other long term (current) drug therapy: Secondary | ICD-10-CM | POA: Diagnosis not present

## 2023-03-10 DIAGNOSIS — E119 Type 2 diabetes mellitus without complications: Secondary | ICD-10-CM | POA: Diagnosis not present

## 2023-03-10 DIAGNOSIS — D62 Acute posthemorrhagic anemia: Secondary | ICD-10-CM | POA: Diagnosis not present

## 2023-03-10 MED ORDER — STERILE WATER FOR INJECTION IJ SOLN
INTRAMUSCULAR | Status: AC
  Filled 2023-03-10: qty 10

## 2023-03-10 MED ORDER — NICOTINE POLACRILEX 2 MG MT GUM
2.0000 mg | CHEWING_GUM | OROMUCOSAL | Status: DC | PRN
  Administered 2023-03-11 – 2023-03-14 (×3): 2 mg via ORAL
  Filled 2023-03-10 (×6): qty 1

## 2023-03-10 MED ORDER — LORAZEPAM 1 MG PO TABS
2.0000 mg | ORAL_TABLET | Freq: Four times a day (QID) | ORAL | Status: DC | PRN
  Administered 2023-03-10: 2 mg via ORAL
  Filled 2023-03-10 (×2): qty 2

## 2023-03-10 MED ORDER — LORAZEPAM 1 MG PO TABS
1.0000 mg | ORAL_TABLET | ORAL | Status: AC | PRN
  Administered 2023-03-11 (×2): 2 mg via ORAL
  Administered 2023-03-13 (×2): 1 mg via ORAL
  Filled 2023-03-10 (×2): qty 2
  Filled 2023-03-10 (×2): qty 1

## 2023-03-10 NOTE — ED Notes (Signed)
Pt inquiring about medication for her back.  This RN explained that it was too early for her Tylenol but that she could have it later if she would like.  Pt agreeable to same and provider with cup of water per request.

## 2023-03-10 NOTE — Progress Notes (Signed)
LCSW Progress Note  [id number]   [name]  03/10/2023  1:07 AM    Inpatient Behavioral Health Placement  Pt meets inpatient criteria per [facility]. There are no available beds within [facility] BH system per [facility] AC Danika Riley,RN. Referral was sent to the following facilities;   Destination  Service Provider Address Phone [facility]  [address] [place] [address] [phone]  [facility]  [address] [place] [address] [phone]  [facility]  [address] [place] [address] [phone]  [facility]  [address], [place] [address] [phone]  [facility]  [address] [place] [address] [phone]  [facility]  [address], [place] [address] [phone]  [facility]  [address], [place] [address] [phone]  [facility]  [address] [address] [phone]  [facility] Patient Placement  [facility], [place] [phone]  [facility]  [address] [place], [place] [address] [phone]  [facility]  [address], [place] [address] [phone]  [facility]  [address] [place] [address] [phone]  [facility]  [address] [place] [address] [phone]  [facility]  [address], [place] [address] [phone]  [facility]  [address] [place] [address] [phone]  [facility]  [address] [place], [place] [address] [phone]  [facility]  [address], [place] [address] [phone]  [facility] Center-Geriatric  [address] [place] [address] [phone]  [facility]  [address] [place] [address] [phone]  [facility]  [address] [place] [address] [phone]  [facility]  [place] [phone]  [facility] EFAX  [address] [place], [place] [phone]  [facility]  [address] [place] [address] [phone]  [facility]  [address], [place] [address] [phone]  [facility]  [address] [place] [address] [phone]  [facility] Inpatient EFAX  [place] [phone]  [facility]  [address] [place] [address] [phone]  [facility] [address]  [place] [address] [phone]  [facility]  [address] [place] [address] [phone]  [facility]  [address] [place] [address] [phone]  [facility]  [address], [place] [address] [phone]  [facility]  [address] [place] [address] [phone]  [facility]  [address] [place], [place] [address] [phone]  [facility]  [address] [place] [address] [phone]     Situation ongoing,  CSW will follow  up.    Maryjean Ka, MSW, LCSWA 03/10/2023 1:07 AM

## 2023-03-10 NOTE — ED Provider Notes (Signed)
Emergency Medicine Observation Re-evaluation Note  [name] is a 57 y.o. female, seen on rounds today.  Pt initially presented to the ED for complaints of Psychiatric Evaluation (Pt presents to the ED accompanied by [facility] [employer] as an IVC. Pt handcuffed, cursing, yelling, and aggressive upon arrival to ED. Per IVC paperwork, daughter filed an IVC due to "pt took a lot of pills in an attempt to kill herself. Accusing people of stealing her drugs, pt aggressive and is threatening to injure another's property and cussing at people." Pt uncooperative during triage and not answering questions. ) Currently, the patient is resting.  Physical Exam  BP 132/75 (BP Location: Right Arm)   Pulse 93   Temp 98.1 F (36.7 C) (Oral)   Resp 16   Ht 5\' 2"  (1.575 m)   Wt 56 kg   LMP 01/08/2011   SpO2 100%   BMI 22.58 kg/m  Physical Exam General: NAD   ED Course / MDM  EKG:EKG Interpretation Date/Time:  Friday March 06 2023 22:07:29 EDT Ventricular Rate:  89 PR Interval:  132 QRS Duration:  82 QT Interval:  364 QTC Calculation: 443 R Axis:   43  Text Interpretation: Sinus rhythm Biatrial enlargement Nonspecific T abnrm, anterolateral leads No significant change since last tracing Confirmed by Gwyneth Sprout ([id number]) on 03/06/2023 10:21:26 PM  I have reviewed the labs performed to date as well as medications administered while in observation.  Recent changes in the last 24 hours include no acute events reported.  Plan  Current plan is for placement.    Wynetta Fines, MD 03/10/23 [phone]

## 2023-03-10 NOTE — ED Notes (Signed)
Breakfast provided.

## 2023-03-10 NOTE — ED Notes (Signed)
Per staffing, no sitter coverage from 0300 to 0700.

## 2023-03-10 NOTE — Progress Notes (Signed)
[facility] Psych ED Progress Note  03/10/2023 12:01 PM [name]  MRN:  [id number]   Subjective:   Patient seen at [facility], ED for face-to-face psychiatric reevaluation.  Patient is pleasant and engages well in conversation.  She remains very discharge focused.  I again explained to her she is under involuntary commitment due to her alleged suicide attempt and we will still be recommending treatment.  She continued to try and convince me she is fine to go home today.  She does deny any suicidal or homicidal ideations.  She denies any auditory or visual hallucinations.  When I attempted to ask her what her plan would be if she were discharged today and her follow-up plan she stared at me blankly.  Patient eventually stated " will to be honest with you I probably would have a beer to today.  I am going to go stay with my mom that's where I normally stay."  At this moment patient has no follow-up plans or intentions for psychiatrist, therapist, or medications.  Patient still appears to lack insight and/or judgment.  Patient does have a history of significant substance abuse and medication noncompliance however patient presented due to suicide attempt that at this point has been left untreated.  Will continue to recommend inpatient psychiatric treatment.   Principal Problem: Schizoaffective disorder, bipolar type (HCC) Diagnosis:  Principal Problem:   Schizoaffective disorder, bipolar type (HCC) Active Problems:   Substance induced mood disorder (HCC)   Alcohol use disorder, severe, dependence (HCC)   ED Assessment Time Calculation: Start Time: 1130 Stop Time: 1150 Total Time in Minutes (Assessment Completion): 20    [place] Scale:  Flowsheet Row ED from 03/06/2023 in [facility] Emergency Department at [facility] ED from 03/01/2023 in [facility] Emergency Department at [facility] ED from 02/27/2023 in [facility] Emergency Department at [facility]  C-SSRS RISK CATEGORY  No Risk No Risk No Risk       Past Medical History:  Past Medical History:  Diagnosis Date   Abdominal pain    Accidental drug overdose April 2013   Anxiety    Atrial fibrillation (HCC) 09/29/11   converted spontaneously   Chronic back pain    Chronic knee pain    Chronic nausea    Chronic pain    COPD (chronic obstructive pulmonary disease) (HCC)    Depression    Diabetes mellitus    states her doctor took her off all DM meds in past month   Diabetic neuropathy (HCC)    Dyspnea    with exertion    GERD (gastroesophageal reflux disease)    Headache(784.0)    migraines    HTN (hypertension)    not on meds since in a year    Hyperlipidemia    Hypothyroidism    not on meds in a while    Mental disorder    Bipolar and schizophrenic   Nausea and vomiting 01/02/2023   Requires supplemental oxygen    as needed per patient    Schizophrenia (HCC)    Schizophrenia, acute (HCC) 11/13/2017   Tobacco abuse     Past Surgical History:  Procedure Laterality Date   ABDOMINAL HYSTERECTOMY     BLADDER SUSPENSION  03/04/2011   Procedure: [facility] PROCEDURE;  Surgeon: Jonna Coup MacDiarmid;  Location: [facility] ORS;  Service: Urology;  Laterality: N/A;   BOWEL RESECTION N/A 04/18/2022   Procedure: SMALL BOWEL RESECTION;  Surgeon: Harriette Bouillon, MD;  Location: [facility] OR;  Service: General;  Laterality: N/A;   CYSTOCELE REPAIR  03/04/2011   Procedure: ANTERIOR REPAIR (CYSTOCELE);  Surgeon: Martina Sinner;  Location: [facility] ORS;  Service: Urology;  Laterality: N/A;   CYSTOSCOPY  03/04/2011   Procedure: CYSTOSCOPY;  Surgeon: Jonna Coup MacDiarmid;  Location: [facility] ORS;  Service: Urology;  Laterality: N/A;   ESOPHAGOGASTRODUODENOSCOPY (EGD) WITH PROPOFOL N/A 05/12/2017   Procedure: ESOPHAGOGASTRODUODENOSCOPY (EGD) WITH PROPOFOL;  Surgeon: Ovidio Kin, MD;  Location: [facility];  Service: General;  Laterality: N/A;   GASTRIC ROUX-EN-Y N/A 03/25/2016   Procedure: LAPAROSCOPIC ROUX-EN-Y GASTRIC BYPASS WITH UPPER  ENDOSCOPY;  Surgeon: Glenna Fellows, MD;  Location: [facility] ORS;  Service: General;  Laterality: N/A;   KNEE SURGERY     LAPAROSCOPIC ASSISTED VAGINAL HYSTERECTOMY  03/04/2011   Procedure: LAPAROSCOPIC ASSISTED VAGINAL HYSTERECTOMY;  Surgeon: Jeani Hawking, MD;  Location: [facility];  Service: Gynecology;  Laterality: N/A;   LAPAROTOMY N/A 04/18/2022   Procedure: EXPLORATORY LAPAROTOMY;  Surgeon: Harriette Bouillon, MD;  Location: [facility] OR;  Service: General;  Laterality: N/A;   LAPAROTOMY N/A 04/24/2022   Procedure: BRING BACK EXPLORATORY LAPAROTOMY;  Surgeon: Violeta Gelinas, MD;  Location: [facility] OR;  Service: General;  Laterality: N/A;   TOTAL HIP ARTHROPLASTY Right 08/27/2022   Procedure: TOTAL HIP ARTHROPLASTY;  Surgeon: Joen Laura, MD;  Location: [facility] OR;  Service: Orthopedics;  Laterality: Right;   Family History:  Family History  Problem Relation Age of Onset   Heart attack Father        41s   Diabetes Mother    Heart disease Mother    Hypertension Mother    Heart attack Sister        73   COPD Other    Breast cancer Neg Hx    Social History:  Social History   Substance and Sexual Activity  Alcohol Use Yes   Comment: 40 ounces daily     Social History   Substance and Sexual Activity  Drug Use Yes   Types: Cocaine, Marijuana, "Crack" cocaine    Social History   Socioeconomic History   Marital status: Legally Separated    Spouse name: Not on file   Number of children: Not on file   Years of education: Not on file   Highest education level: Not on file  Occupational History   Not on file  Tobacco Use   Smoking status: Every Day    Current packs/day: 1.00    Types: Cigarettes   Smokeless tobacco: Never  Vaping Use   Vaping status: Never Used  Substance and Sexual Activity   Alcohol use: Yes    Comment: 40 ounces daily   Drug use: Yes    Types: Cocaine, Marijuana, "Crack" cocaine   Sexual activity: Yes    Partners: Male  Other Topics Concern   Not on file   Social History Narrative   Not on file   Social Determinants of Health   Financial Resource Strain: Not on file  Food Insecurity: No Food Insecurity (02/22/2023)   Hunger Vital Sign    Worried About Running Out of Food in the Last Year: Never true    Ran Out of Food in the Last Year: Never true  Transportation Needs: No Transportation Needs (02/22/2023)   PRAPARE - Administrator, Civil Service (Medical): No    Lack of Transportation (Non-Medical): No  Physical Activity: Not on file  Stress: Not on file  Social Connections: Unknown (01/06/2023)   Received from [facility]   Social Network    Social Network: Not on file    Sleep: Fair  Appetite:  Good  Current Medications: Current Facility-Administered Medications  Medication Dose Route Frequency Provider Last Rate Last Admin   acetaminophen (TYLENOL) tablet 650 mg  650 mg Oral Q6H PRN Rancour, Stephen, MD   650 mg at 03/10/23 0503   albuterol (PROVENTIL) (2.5 MG/3ML) 0.083% nebulizer solution 2.5 mg  2.5 mg Nebulization Q6H PRN Gwyneth Sprout, MD   2.5 mg at 03/07/23 1801   albuterol (VENTOLIN HFA) 108 (90 Base) MCG/ACT inhaler 2 puff  2 puff Inhalation Q6H PRN Tegeler, Canary Brim, MD   2 puff at 03/09/23 1709   ARIPiprazole (ABILIFY) tablet 10 mg  10 mg Oral Daily Eligha Bridegroom, NP   10 mg at 03/10/23 0913   famotidine (PEPCID) tablet 20 mg  20 mg Oral Daily Plunkett, Alphonzo Lemmings, MD   20 mg at 03/10/23 0912   fluticasone furoate-vilanterol (BREO ELLIPTA) 200-25 MCG/ACT 1 puff  1 puff Inhalation Daily Gwyneth Sprout, MD   1 puff at 03/10/23 0811   folic acid (FOLVITE) tablet 1 mg  1 mg Oral Daily Ophelia Shoulder E, NP   1 mg at 03/10/23 0915   furosemide (LASIX) tablet 20 mg  20 mg Oral Daily Gwyneth Sprout, MD   20 mg at 03/10/23 0913   gabapentin (NEURONTIN) capsule 300 mg  300 mg Oral TID Ophelia Shoulder E, NP   300 mg at 03/10/23 0913   LORazepam (ATIVAN) tablet 1-4 mg  1-4 mg Oral Q1H PRN Ophelia Shoulder E,  NP   2 mg at 03/10/23 1014   metFORMIN (GLUCOPHAGE-XR) 24 hr tablet 750 mg  750 mg Oral Q breakfast Ophelia Shoulder E, NP   750 mg at 03/10/23 1610   nicotine (NICODERM CQ - dosed in mg/24 hours) patch 21 mg  21 mg Transdermal Daily Gloris Manchester, MD   21 mg at 03/10/23 0912   ondansetron (ZOFRAN) tablet 4 mg  4 mg Oral Q6H Ophelia Shoulder E, NP   4 mg at 03/10/23 0640   sucralfate (CARAFATE) tablet 1 g  1 g Oral TID WC & HS Ophelia Shoulder E, NP   1 g at 03/10/23 9604   thiamine (VITAMIN B1) tablet 100 mg  100 mg Oral Daily Ophelia Shoulder E, NP   100 mg at 03/10/23 5409   Or   thiamine (VITAMIN B1) injection 100 mg  100 mg Intravenous Daily Ophelia Shoulder E, NP       ziprasidone (GEODON) injection 20 mg  20 mg Intramuscular Q12H PRN Ophelia Shoulder E, NP   20 mg at 03/09/23 1947   Current Outpatient Medications  Medication Sig Dispense Refill   acetaminophen (TYLENOL) 325 MG tablet Take 650 mg by mouth every 6 (six) hours as needed for mild pain.     albuterol (VENTOLIN HFA) 108 (90 Base) MCG/ACT inhaler Inhale 1-2 puffs into the lungs every 6 (six) hours as needed for wheezing or shortness of breath. 1 each 0   budesonide-formoterol (SYMBICORT) 160-4.5 MCG/ACT inhaler Inhale 2 puffs into the lungs 2 (two) times daily. 10.2 g 2   diclofenac Sodium (VOLTAREN) 1 % GEL Apply 2 g topically 4 (four) times daily. (Patient not taking: Reported on 03/07/2023)     famotidine (PEPCID) 20 MG tablet Take 1 tablet (20 mg total) by mouth daily. (Patient not taking: Reported on 03/07/2023) 30 tablet 0   furosemide (LASIX) 20 MG tablet Take 1 tablet (20 mg  total) by mouth daily. (Patient not taking: Reported on 03/07/2023) 2 tablet 0   gabapentin (NEURONTIN) 100 MG capsule Take 100 mg by mouth 3 (three) times daily. (Patient not taking: Reported on 03/07/2023)     hyoscyamine (LEVSIN/SL) 0.125 MG SL tablet Place 1 tablet (0.125 mg total) under the tongue every 4 (four) hours as needed (pain). Up to 1.25 mg daily (Patient not  taking: Reported on 02/22/2023) 20 tablet 0   metFORMIN (GLUCOPHAGE-XR) 750 MG 24 hr tablet Take 1 tablet (750 mg total) by mouth daily. (Patient not taking: Reported on 03/07/2023) 30 tablet 0   ondansetron (ZOFRAN) 4 MG tablet Take 1 tablet (4 mg total) by mouth every 6 (six) hours. (Patient not taking: Reported on 02/22/2023) 12 tablet 0   ondansetron (ZOFRAN-ODT) 4 MG disintegrating tablet Take 1 tablet (4 mg total) by mouth every 8 (eight) hours as needed for nausea or vomiting. (Patient not taking: Reported on 03/07/2023) 8 tablet 0   OXYGEN Inhale 4-5 L into the lungs as needed (COPD).     sucralfate (CARAFATE) 1 g tablet Take 1 tablet (1 g total) by mouth 4 (four) times daily -  with meals and at bedtime. (Patient not taking: Reported on 02/22/2023) 90 tablet 0   tiZANidine (ZANAFLEX) 2 MG tablet Take 2 mg by mouth at bedtime. (Patient not taking: Reported on 03/07/2023)     zonisamide (ZONEGRAN) 100 MG capsule Take 2 capsules (200 mg total) by mouth daily. (Patient not taking: Reported on 02/22/2023) 30 capsule 3    Lab Results: No results found for this or any previous visit (from the past 48 hour(s)).  Blood Alcohol level:  Lab Results  Component Value Date   ETH 193 (H) 03/06/2023   ETH <10 02/25/2023    Physical Findings:  CIWA:  CIWA-Ar Total: 10  Psychiatric Specialty Exam:  Presentation  General Appearance:  Appropriate for Environment  Eye Contact: Fair  Speech: Clear and Coherent  Speech Volume: Normal  Handedness: Right   Mood and Affect  Mood: Anxious  Affect: Congruent   Thought Process  Thought Processes: Goal Directed  Descriptions of Associations:Intact  Orientation:Full (Time, Place and Person)  Thought Content:WDL  History of Schizophrenia/Schizoaffective disorder:Yes  Duration of Psychotic Symptoms:Greater than six months  Hallucinations:Hallucinations: None  Ideas of Reference:None  Suicidal Thoughts:Suicidal Thoughts:  No  Homicidal Thoughts:Homicidal Thoughts: No   Sensorium  Memory: Immediate Fair; Recent Fair  Judgment: Fair  Insight: Poor   Executive Functions  Concentration: Fair  Attention Span: Fair  Recall: Fair  Fund of Knowledge: Fair  Language: Fair   Psychomotor Activity  Psychomotor Activity: Psychomotor Activity: Normal   Assets  Assets: Desire for Improvement; Physical Health; Resilience   Sleep  Sleep: Sleep: Fair    Physical Exam: Physical Exam Neurological:     Mental Status: She is alert and oriented to person, place, and time.  Psychiatric:        Attention and Perception: Attention normal.        Mood and Affect: Mood is anxious.        Speech: Speech normal.        Behavior: Behavior is cooperative.        Thought Content: Thought content normal.    Review of Systems  Psychiatric/Behavioral:  Positive for substance abuse.   All other systems reviewed and are negative.  Blood pressure 132/75, pulse 93, temperature 98.1 F (36.7 C), temperature source Oral, resp. rate 16, height 5\' 2"  (1.575 m),  weight 56 kg, last menstrual period 01/08/2011, SpO2 100%. Body mass index is 22.58 kg/m.   Medical Decision Making: Pt case reviewed and discussed with Dr. Lucianne Muss. Will continue to recommend IVC and inpatient psychiatric treatment. Pt currently under review at [facility] pending availability. Will continue to follow up daily.   - no medication changes  Eligha Bridegroom, NP 03/10/2023, 12:01 PM

## 2023-03-10 NOTE — Progress Notes (Signed)
LCSW Progress Note  [id number]   [name]  03/10/2023  1:18 PM  Description:   Inpatient Psychiatric Referral  Patient was recommended inpatient per Eligha Bridegroom, NP. There are no available beds at [facility], per [facility] Rona Ravens, RN. Patient was referred to the following out of network facilities:   Destination  Service Provider Address Phone Fax  [facility]  [address] [place], [place] [address] [phone]  [facility]  [address], [place] [address] [phone]  [facility]  [address] [place] [address] [phone]  [facility]  [address] [place] [address] [phone]  [facility]  [address] [address] [phone]  [facility] Patient Placement  [facility], [place] [phone]  [facility]  [address] [place], [place] [address] [phone]  [facility]  [address] [place], [place] [address] [phone]  [facility]  [address] [place] [address] [phone]  [facility]  [address], [place] [address] [phone]  [facility]  [address], [place] [address] [phone]  [facility]  [address], [place] [address] [phone]  [facility]  [address] [place], [place] [address] [phone]  [facility]  [address] [place] [address] [phone]  [facility]  [address] [place]  [address] [phone]  [facility]  [address] [place] [address] [phone]  [facility]  [address], [place] [address] [phone]  [facility]  [place] [phone]  [facility] EFAX  [address] [place], [place] [phone]  [facility]  [address], [place] [address] [phone]  [facility]  [address], [place] [address] [phone]  [facility]  [address] [place] [address] [phone]  [facility]  [place] [phone]  [facility]  [address] [place] [address] [phone]  [facility] [address]  [place] [address] [phone]    Situation ongoing, CSW to continue following and update chart as more information becomes available.      Cathie Beams, LCSW  03/10/2023 1:18 PM

## 2023-03-10 NOTE — ED Notes (Signed)
IVC current

## 2023-03-10 NOTE — ED Notes (Signed)
Notified Dr. Adela Lank that the patient is refusing VS and physical assessment, she is attempting to leave, spitting on the floor, yelling and cursing at staff, Security called and is at the bedside. Dr Adela Lank notified.

## 2023-03-11 ENCOUNTER — Encounter (HOSPITAL_COMMUNITY): Payer: Self-pay | Admitting: Psychiatry

## 2023-03-11 DIAGNOSIS — E119 Type 2 diabetes mellitus without complications: Secondary | ICD-10-CM | POA: Diagnosis not present

## 2023-03-11 DIAGNOSIS — F250 Schizoaffective disorder, bipolar type: Secondary | ICD-10-CM | POA: Diagnosis not present

## 2023-03-11 DIAGNOSIS — E538 Deficiency of other specified B group vitamins: Secondary | ICD-10-CM | POA: Diagnosis not present

## 2023-03-11 DIAGNOSIS — J449 Chronic obstructive pulmonary disease, unspecified: Secondary | ICD-10-CM | POA: Diagnosis not present

## 2023-03-11 DIAGNOSIS — E871 Hypo-osmolality and hyponatremia: Secondary | ICD-10-CM | POA: Diagnosis not present

## 2023-03-11 DIAGNOSIS — D62 Acute posthemorrhagic anemia: Secondary | ICD-10-CM | POA: Diagnosis not present

## 2023-03-11 DIAGNOSIS — E039 Hypothyroidism, unspecified: Secondary | ICD-10-CM | POA: Diagnosis not present

## 2023-03-11 DIAGNOSIS — I4891 Unspecified atrial fibrillation: Secondary | ICD-10-CM | POA: Diagnosis not present

## 2023-03-11 DIAGNOSIS — Z96641 Presence of right artificial hip joint: Secondary | ICD-10-CM | POA: Diagnosis not present

## 2023-03-11 DIAGNOSIS — Z7984 Long term (current) use of oral hypoglycemic drugs: Secondary | ICD-10-CM | POA: Diagnosis not present

## 2023-03-11 DIAGNOSIS — Z79899 Other long term (current) drug therapy: Secondary | ICD-10-CM | POA: Diagnosis not present

## 2023-03-11 DIAGNOSIS — I10 Essential (primary) hypertension: Secondary | ICD-10-CM | POA: Diagnosis not present

## 2023-03-11 DIAGNOSIS — F17210 Nicotine dependence, cigarettes, uncomplicated: Secondary | ICD-10-CM | POA: Diagnosis not present

## 2023-03-11 MED ORDER — DIPHENHYDRAMINE HCL 25 MG PO CAPS
50.0000 mg | ORAL_CAPSULE | Freq: Four times a day (QID) | ORAL | Status: DC | PRN
  Administered 2023-03-12 – 2023-03-16 (×9): 50 mg via ORAL
  Filled 2023-03-11 (×9): qty 2

## 2023-03-11 MED ORDER — LORAZEPAM 2 MG/ML IJ SOLN: 2.0000 mg | Freq: Once | INTRAMUSCULAR | Status: DC

## 2023-03-11 MED ORDER — LORAZEPAM 2 MG/ML IJ SOLN
2.0000 mg | Freq: Four times a day (QID) | INTRAMUSCULAR | Status: DC | PRN
  Administered 2023-03-12 – 2023-03-13 (×3): 2 mg via INTRAMUSCULAR
  Filled 2023-03-11 (×3): qty 1

## 2023-03-11 MED ORDER — HALOPERIDOL LACTATE 5 MG/ML IJ SOLN
5.0000 mg | Freq: Four times a day (QID) | INTRAMUSCULAR | Status: DC | PRN
  Administered 2023-03-12 – 2023-03-13 (×2): 5 mg via INTRAMUSCULAR
  Filled 2023-03-11 (×3): qty 1

## 2023-03-11 MED ORDER — LORAZEPAM 1 MG PO TABS
2.0000 mg | ORAL_TABLET | Freq: Four times a day (QID) | ORAL | Status: DC | PRN
  Administered 2023-03-12 – 2023-03-16 (×9): 2 mg via ORAL
  Filled 2023-03-11 (×9): qty 2

## 2023-03-11 MED ORDER — DIPHENHYDRAMINE HCL 50 MG/ML IJ SOLN
50.0000 mg | Freq: Four times a day (QID) | INTRAMUSCULAR | Status: DC | PRN
  Administered 2023-03-12 – 2023-03-13 (×2): 50 mg via INTRAMUSCULAR
  Filled 2023-03-11 (×3): qty 1

## 2023-03-11 MED ORDER — LORAZEPAM 2 MG/ML IJ SOLN: 1.0000 mg | Freq: Once | INTRAMUSCULAR | Status: DC

## 2023-03-11 MED ORDER — HALOPERIDOL 5 MG PO TABS
5.0000 mg | ORAL_TABLET | Freq: Four times a day (QID) | ORAL | Status: DC | PRN
  Administered 2023-03-12 – 2023-03-16 (×9): 5 mg via ORAL
  Filled 2023-03-11 (×3): qty 1
  Filled 2023-03-11 (×2): qty 10
  Filled 2023-03-11 (×3): qty 1
  Filled 2023-03-11 (×3): qty 10
  Filled 2023-03-11: qty 1

## 2023-03-11 MED ORDER — STERILE WATER FOR INJECTION IJ SOLN
INTRAMUSCULAR | Status: AC
  Filled 2023-03-11: qty 10

## 2023-03-11 NOTE — ED Notes (Signed)
Pt attempted to leave unit, escorted back to room by security.

## 2023-03-11 NOTE — ED Notes (Signed)
Pt behavior escalating, pt pacing in hallway. Asking for belongings in order to get her cigarettes so she can go outside and smoke. Pt is not redirectable at this time. Pt telling people to "call [phone] because she is about to to tear shit up." When asked why she is going to do this she states it is because she wants her cigarettes.

## 2023-03-11 NOTE — ED Notes (Signed)
Pt pushed bedside table outside of room, throwing drinks out of her room. Bedside table removed. Security at bedside.

## 2023-03-11 NOTE — ED Provider Notes (Addendum)
Emergency Medicine Observation Re-evaluation Note Patient standing by the door.  No acute distress.  Cooperative at this time.  Did receive Geodon early this morning.  Physical Exam  BP (!) 142/77 (BP Location: Right Arm)   Pulse 100   Temp 97.7 F (36.5 C) (Axillary)   Resp 18   Ht 1.575 m (5\' 2" )   Wt 56 kg   LMP 01/08/2011   SpO2 98%   BMI 22.58 kg/m  Physical Exam   ED Course / MDM  EKG:EKG Interpretation Date/Time:  Friday March 06 2023 22:07:29 EDT Ventricular Rate:  89 PR Interval:  132 QRS Duration:  82 QT Interval:  364 QTC Calculation: 443 R Axis:   43  Text Interpretation: Sinus rhythm Biatrial enlargement Nonspecific T abnrm, anterolateral leads No significant change since last tracing Confirmed by Gwyneth Sprout ([id number]) on 03/06/2023 10:21:26 PM  I have reviewed the labs performed to date as well as medications administered while in observation.  Recent changes in the last 24 hours include as above.  Plan  Current plan is for reevaluation by psychiatry with possible discharge or will need to seek inpatient.   11:51 AM Called to see patient due to increased agitation.  Patient had received Geodon several minutes before.  Will restrain until Geodon to assess effect.  Have reengaged behavioral health team to look at her medications and make adjustments Lorre Nick, MD 03/11/23 5638    Lorre Nick, MD 03/11/23 1151

## 2023-03-11 NOTE — Progress Notes (Signed)
Inpatient placement is still pending.
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
## 2023-03-11 NOTE — ED Notes (Addendum)
Pt refusing all medications this morning due to not being able to go outside to smoke. Pt given nicotine patch and nicorette gum. Freida Busman, MD notified

## 2023-03-11 NOTE — ED Notes (Signed)
IVC PAPERWORK CURRENT

## 2023-03-11 NOTE — ED Notes (Signed)
IVC current, expires 03/13/23

## 2023-03-11 NOTE — Progress Notes (Signed)
.  LCSW Progress Note  [id number]   [name]  03/11/2023  2:19 PM  Description:   Inpatient Psychiatric Referral  Patient was recommended inpatient per Arsenio Loader, NP. There are no available beds at [facility], per [facility] Rona Ravens, RN. Patient was referred to the following out of network facilities:   Destination  Service Provider Request Status Address Phone Fax  [facility]  Pending - Request Sent [address] [place] [place] [address] [phone]  [facility]  Pending - Request Sent [address], [place] [address] [phone]  [facility]  Pending - Request Sent [address] [place] [address] [phone]  [facility]  Pending - Request Sent [address], [place] [address] [phone]  [facility]  Pending - Request Sent [address] [place] [address] [phone]  [facility]  Pending - Request Sent [address] [name] [address] [phone]  [facility]  Pending - Request Sent [address] [place] [address] [phone]  [facility]  Pending - Request Sent [address] [place] [address] [phone]  [facility] Pl[facility]  Pending - Request [facility], [place] [phone]  [facility]  Pending - Request Sent [address] [place], [place] [address] [phone]  [facility]  Pending - Request Sent [address] [place] [address] [phone]  [facility]  Pending - Request Sent [address] [place] [address] [phone]  [facility]  Pending - Request Sent [address], [place] [address] [phone]  [facility]  Pending - Request Sent [address], [place] [address] [phone]  [facility]  Pending - Request Sent [address], [place] [address] [phone]  [facility]  Pending - Request Sent [address] [place], [place] [address] [phone]  [name]  Pending - Request Sent [address], [place] [address] [phone]  [facility]  Pending - Request Sent [address] [place] [address] [phone]  [facility]  Pending - Request Sent [address], [place] [address] [phone]  [facility]  Pending - Request Sent [address], [place] [address] [phone]  [facility]  Pending - Request Sent [place] [phone]  [facility]  Pending - Request Sent [address] [name] [phone]  [facility]  Pending - Request Sent [address], [place] [address] [phone]  [facility]  Pending - Request Sent [address], [place] [address] [phone]  [facility]  Pending - Request Sent [address], [place] [address] [phone]    Situation ongoing, CSW to continue following and update chart as more information becomes available.      [address] [place], [facility] 03/11/2023 2:19 PM

## 2023-03-12 DIAGNOSIS — E538 Deficiency of other specified B group vitamins: Secondary | ICD-10-CM | POA: Diagnosis not present

## 2023-03-12 DIAGNOSIS — E871 Hypo-osmolality and hyponatremia: Secondary | ICD-10-CM | POA: Diagnosis not present

## 2023-03-12 DIAGNOSIS — Z7984 Long term (current) use of oral hypoglycemic drugs: Secondary | ICD-10-CM | POA: Diagnosis not present

## 2023-03-12 DIAGNOSIS — E039 Hypothyroidism, unspecified: Secondary | ICD-10-CM | POA: Diagnosis not present

## 2023-03-12 DIAGNOSIS — I10 Essential (primary) hypertension: Secondary | ICD-10-CM | POA: Diagnosis not present

## 2023-03-12 DIAGNOSIS — F1020 Alcohol dependence, uncomplicated: Secondary | ICD-10-CM

## 2023-03-12 DIAGNOSIS — F250 Schizoaffective disorder, bipolar type: Secondary | ICD-10-CM | POA: Diagnosis not present

## 2023-03-12 DIAGNOSIS — F17210 Nicotine dependence, cigarettes, uncomplicated: Secondary | ICD-10-CM | POA: Diagnosis not present

## 2023-03-12 DIAGNOSIS — E119 Type 2 diabetes mellitus without complications: Secondary | ICD-10-CM | POA: Diagnosis not present

## 2023-03-12 DIAGNOSIS — I4891 Unspecified atrial fibrillation: Secondary | ICD-10-CM | POA: Diagnosis not present

## 2023-03-12 DIAGNOSIS — Z96641 Presence of right artificial hip joint: Secondary | ICD-10-CM | POA: Diagnosis not present

## 2023-03-12 DIAGNOSIS — D62 Acute posthemorrhagic anemia: Secondary | ICD-10-CM | POA: Diagnosis not present

## 2023-03-12 DIAGNOSIS — J449 Chronic obstructive pulmonary disease, unspecified: Secondary | ICD-10-CM | POA: Diagnosis not present

## 2023-03-12 DIAGNOSIS — F1994 Other psychoactive substance use, unspecified with psychoactive substance-induced mood disorder: Secondary | ICD-10-CM

## 2023-03-12 DIAGNOSIS — Z79899 Other long term (current) drug therapy: Secondary | ICD-10-CM | POA: Diagnosis not present

## 2023-03-12 LAB — CBC WITH DIFFERENTIAL/PLATELET
Abs Immature Granulocytes: 0.02 10*3/uL (ref 0.00–0.07)
Basophils Absolute: 0 10*3/uL (ref 0.0–0.1)
Basophils Relative: 1 %
Eosinophils Absolute: 0.1 10*3/uL (ref 0.0–0.5)
Eosinophils Relative: 1 %
HCT: 25.7 % — ABNORMAL LOW (ref 36.0–46.0)
Hemoglobin: 7.2 g/dL — ABNORMAL LOW (ref 12.0–15.0)
Immature Granulocytes: 0 %
Lymphocytes Relative: 25 %
Lymphs Abs: 1.9 10*3/uL (ref 0.7–4.0)
MCH: 22.4 pg — ABNORMAL LOW (ref 26.0–34.0)
MCHC: 28 g/dL — ABNORMAL LOW (ref 30.0–36.0)
MCV: 79.8 fL — ABNORMAL LOW (ref 80.0–100.0)
Monocytes Absolute: 0.5 10*3/uL (ref 0.1–1.0)
Monocytes Relative: 7 %
Neutro Abs: 5 10*3/uL (ref 1.7–7.7)
Neutrophils Relative %: 66 %
Platelets: 356 10*3/uL (ref 150–400)
RBC: 3.22 MIL/uL — ABNORMAL LOW (ref 3.87–5.11)
RDW: 20.1 % — ABNORMAL HIGH (ref 11.5–15.5)
WBC: 7.6 10*3/uL (ref 4.0–10.5)
nRBC: 0 % (ref 0.0–0.2)

## 2023-03-12 LAB — COMPREHENSIVE METABOLIC PANEL
ALT: 17 U/L (ref 0–44)
AST: 21 U/L (ref 15–41)
Albumin: 3 g/dL — ABNORMAL LOW (ref 3.5–5.0)
Alkaline Phosphatase: 65 U/L (ref 38–126)
Anion gap: 10 (ref 5–15)
BUN: 7 mg/dL (ref 6–20)
CO2: 25 mmol/L (ref 22–32)
Calcium: 8.6 mg/dL — ABNORMAL LOW (ref 8.9–10.3)
Chloride: 96 mmol/L — ABNORMAL LOW (ref 98–111)
Creatinine, Ser: 0.5 mg/dL (ref 0.44–1.00)
GFR, Estimated: >60 mL/min (ref >60)
Glucose, Bld: 200 mg/dL — ABNORMAL HIGH (ref 70–99)
Potassium: 3.6 mmol/L (ref 3.5–5.1)
Sodium: 131 mmol/L — ABNORMAL LOW (ref 135–145)
Total Bilirubin: 0.4 mg/dL (ref 0.3–1.2)
Total Protein: 6.6 g/dL (ref 6.5–8.1)

## 2023-03-12 LAB — FOLATE: Folate: 25.2 ng/mL (ref >5.9)

## 2023-03-12 LAB — RETICULOCYTES
Immature Retic Fract: 20.3 % — ABNORMAL HIGH (ref 2.3–15.9)
RBC.: 3.2 MIL/uL — ABNORMAL LOW (ref 3.87–5.11)
Retic Count, Absolute: 64.3 10*3/uL (ref 19.0–186.0)
Retic Ct Pct: 2 % (ref 0.4–3.1)

## 2023-03-12 LAB — IRON AND TIBC
Iron: <5 ug/dL — ABNORMAL LOW (ref 28–170)
TIBC: 552 ug/dL — ABNORMAL HIGH (ref 250–450)

## 2023-03-12 LAB — VITAMIN B12: Vitamin B-12: 212 pg/mL (ref 180–914)

## 2023-03-12 LAB — FERRITIN: Ferritin: 7 ng/mL — ABNORMAL LOW (ref 11–307)

## 2023-03-12 MED ORDER — FERROUS SULFATE 325 (65 FE) MG PO TABS
325.0000 mg | ORAL_TABLET | Freq: Two times a day (BID) | ORAL | Status: DC
  Administered 2023-03-12 – 2023-03-14 (×4): 325 mg via ORAL
  Filled 2023-03-12 (×4): qty 1

## 2023-03-12 MED ORDER — SIMETHICONE 80 MG PO CHEW
40.0000 mg | CHEWABLE_TABLET | Freq: Four times a day (QID) | ORAL | Status: DC | PRN
  Administered 2023-03-12: 40 mg via ORAL
  Filled 2023-03-12: qty 1
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
## 2023-03-12 NOTE — ED Notes (Signed)
Per staffing, no sitter available for pt. CN made aware.

## 2023-03-12 NOTE — ED Notes (Signed)
Pt more confused, had to be reoriented to where she is by sitter. When this RN asked orientation questions, pt able to answer all questions except the reason why she is here. Pt said she is here "because yall keep fucking with me!".

## 2023-03-12 NOTE — ED Notes (Signed)
Pt given chair so she can sit in the doorway of her room. Pt still believes she has bugs in her room.

## 2023-03-12 NOTE — ED Notes (Signed)
Pt c/o gas pains. Notified EDP

## 2023-03-12 NOTE — ED Notes (Signed)
Pt noncompliant w/ staying in her room at this time stating that there are bugs in her room. Pt was told that she could stand in the doorway, but a chair was not going to be provided at this time d/t previous behavior. Pt verbally aggressive w/ staff and security.

## 2023-03-12 NOTE — ED Provider Notes (Addendum)
Emergency Medicine Observation Re-evaluation Note  MICHELENE BARRIENTOS is a 57 y.o. female, seen on rounds today.  Pt initially presented to the ED for complaints of Psychiatric Evaluation (Pt presents to the ED accompanied by [facility] as an IVC. Pt handcuffed, cursing, yelling, and aggressive upon arrival to ED. Per IVC paperwork, daughter filed an IVC due to "pt took a lot of pills in an attempt to kill herself. Accusing people of stealing her drugs, pt aggressive and is threatening to injure another's property and cussing at people." Pt uncooperative during triage and not answering questions. ) Currently, the patient is sitting doing word puzzles. Is asking to go smoke  Physical Exam  BP 137/73 (BP Location: Left Arm)   Pulse (!) 103   Temp 99 F (37.2 C) (Oral)   Resp 16   Ht 5\' 2"  (1.575 m)   Wt 56 kg   LMP 01/08/2011   SpO2 99%   BMI 22.58 kg/m  Physical Exam General: no acute distress Lungs: no acute distress Psych: no agitation  ED Course / MDM  EKG:EKG Interpretation Date/Time:  Friday March 06 2023 22:07:29 EDT Ventricular Rate:  89 PR Interval:  132 QRS Duration:  82 QT Interval:  364 QTC Calculation: 443 R Axis:   43  Text Interpretation: Sinus rhythm Biatrial enlargement Nonspecific T abnrm, anterolateral leads No significant change since last tracing Confirmed by Gwyneth Sprout ([id number]) on 03/06/2023 10:21:26 PM  I have reviewed the labs performed to date as well as medications administered while in observation.  Recent changes in the last 24 hours include ativan and benadryl given for agitation.  Plan  Current plan is for inpatient psychiatric placement.  9:13 AM Psychiatry is asking for an evaluation for her low hemoglobin. 6 days ago Hgb was 7.5. last before that was 8.1 (9.4). will recheck labs. She denies any recent bleeding in the past 2 weeks.  11:26 AM hemoglobin has down trended mildly to 7.2.  I suspect this is an iron deficiency problem given the  very slow downtrend.  Will start her on ferrous sulfate.  Discussed with ED pharmacist, she had a reaction to iron dextran but it was common for people to get reactions to this and this is not a contraindication to the ferrous sulfate itself.  More likely related to the actual components in the IV medication form. Will order a CBC for AM.    Pricilla Loveless, MD 03/12/23 [phone]

## 2023-03-12 NOTE — ED Notes (Signed)
Ivc status current paperwork expires tommorrow

## 2023-03-12 NOTE — Progress Notes (Addendum)
[facility] Psych ED Progress Note  03/12/2023 2:55 PM [name]  MRN:  [id number]   Subjective:   Patient seen today at the [facility] emergency department for face-to-face psychiatric reevaluation.  Upon reevaluation, patient engagement largely is characterized by continued severe discharge focus, continued limited insight into the need for treatment, a dysphoric mood, and now endorsements of seeing bugs (as reported previously by family and others).    Patient endorsed today to this provider, as well as to staff, that she has been seeing bugs in her room, states that she requested a chair earlier today so that she did not have to be in her room with the bugs that she is seeing, but could sit in the chair at the edge of her doorway with a table.  Patient endorsed that she continues to not have suicidal and/or homicidal ideations, paranoia, auditory and/or visual hallucinations, and/or withdrawal symptomology from severe EtOH use.  Patient endorsed that she is tolerating the medications prescribed for her, states however that she feels that she does not need them though.  Patient orientation was intact upon assessment.  Patient did not appear to be responding to internal stimuli, though her endorsements of seeing bugs is evident, points around her room to seeing things.  Discussed with patient continued recommendation for inpatient hospitalization, states that she disagrees with this.  Patient physically assessed, no appreciable tremors, no appreciable other EtOH withdrawal symptomology.  Patient last CIWA appreciably 0 at 0 700 a.m.  Per nursing: Patient has been reporting seeing bugs, selectively compliant with medications, impulsively recently stormed off the purple zone unit in a frustrated manner stating that she was unhappy and wanted to go smoke.  Principal Problem: Schizoaffective disorder, bipolar type (HCC) Diagnosis:  Principal Problem:   Schizoaffective disorder, bipolar type (HCC) Active  Problems:   Substance induced mood disorder (HCC)   Alcohol use disorder, severe, dependence (HCC)   ED Assessment Time Calculation: Start Time: 1345 Stop Time: 1400 Total Time in Minutes (Assessment Completion): 15   Past Psychiatric History: As reported  [place] Scale:  Flowsheet Row ED from 03/06/2023 in [facility] Emergency Department at [facility] ED from 03/01/2023 in [facility] Emergency Department at [facility] ED from 02/27/2023 in [facility] Emergency Department at [facility]  C-SSRS RISK CATEGORY No Risk No Risk No Risk       Past Medical History:  Past Medical History:  Diagnosis Date   Abdominal pain    Accidental drug overdose April 2013   Anxiety    Atrial fibrillation (HCC) 09/29/11   converted spontaneously   Chronic back pain    Chronic knee pain    Chronic nausea    Chronic pain    COPD (chronic obstructive pulmonary disease) (HCC)    Depression    Diabetes mellitus    states her doctor took her off all DM meds in past month   Diabetic neuropathy (HCC)    Dyspnea    with exertion    GERD (gastroesophageal reflux disease)    Headache(784.0)    migraines    HTN (hypertension)    not on meds since in a year    Hyperlipidemia    Hypothyroidism    not on meds in a while    Mental disorder    Bipolar and schizophrenic   Nausea and vomiting 01/02/2023   Requires supplemental oxygen    as needed per patient    Schizophrenia (HCC)    Schizophrenia,  acute (HCC) 11/13/2017   Tobacco abuse     Past Surgical History:  Procedure Laterality Date   ABDOMINAL HYSTERECTOMY     BLADDER SUSPENSION  03/04/2011   Procedure: [facility] PROCEDURE;  Surgeon: Jonna Coup MacDiarmid;  Location: [facility] ORS;  Service: Urology;  Laterality: N/A;   BOWEL RESECTION N/A 04/18/2022   Procedure: SMALL BOWEL RESECTION;  Surgeon: Harriette Bouillon, MD;  Location: [facility] OR;  Service: General;  Laterality: N/A;   CYSTOCELE REPAIR  03/04/2011   Procedure: ANTERIOR REPAIR  (CYSTOCELE);  Surgeon: Martina Sinner;  Location: [facility] ORS;  Service: Urology;  Laterality: N/A;   CYSTOSCOPY  03/04/2011   Procedure: CYSTOSCOPY;  Surgeon: Jonna Coup MacDiarmid;  Location: [facility];  Service: Urology;  Laterality: N/A;   ESOPHAGOGASTRODUODENOSCOPY (EGD) WITH PROPOFOL N/A 05/12/2017   Procedure: ESOPHAGOGASTRODUODENOSCOPY (EGD) WITH PROPOFOL;  Surgeon: Ovidio Kin, MD;  Location: [facility];  Service: General;  Laterality: N/A;   GASTRIC ROUX-EN-Y N/A 03/25/2016   Procedure: LAPAROSCOPIC ROUX-EN-Y GASTRIC BYPASS WITH UPPER ENDOSCOPY;  Surgeon: Glenna Fellows, MD;  Location: [facility] ORS;  Service: General;  Laterality: N/A;   KNEE SURGERY     LAPAROSCOPIC ASSISTED VAGINAL HYSTERECTOMY  03/04/2011   Procedure: LAPAROSCOPIC ASSISTED VAGINAL HYSTERECTOMY;  Surgeon: Jeani Hawking, MD;  Location: [facility];  Service: Gynecology;  Laterality: N/A;   LAPAROTOMY N/A 04/18/2022   Procedure: EXPLORATORY LAPAROTOMY;  Surgeon: Harriette Bouillon, MD;  Location: [facility] OR;  Service: General;  Laterality: N/A;   LAPAROTOMY N/A 04/24/2022   Procedure: BRING BACK EXPLORATORY LAPAROTOMY;  Surgeon: Violeta Gelinas, MD;  Location: [facility] OR;  Service: General;  Laterality: N/A;   TOTAL HIP ARTHROPLASTY Right 08/27/2022   Procedure: TOTAL HIP ARTHROPLASTY;  Surgeon: Joen Laura, MD;  Location: [facility] OR;  Service: Orthopedics;  Laterality: Right;   Family History:  Family History  Problem Relation Age of Onset   Heart attack Father        63s   Diabetes Mother    Heart disease Mother    Hypertension Mother    Heart attack Sister        73   COPD Other    Breast cancer Neg Hx    Family Psychiatric  History: None endorsed Social History:  Social History   Substance and Sexual Activity  Alcohol Use Yes   Comment: 40 ounces daily     Social History   Substance and Sexual Activity  Drug Use Yes   Types: Cocaine, Marijuana, "Crack" cocaine    Social History   Socioeconomic History   Marital  status: Legally Separated    Spouse name: Not on file   Number of children: Not on file   Years of education: Not on file   Highest education level: Not on file  Occupational History   Not on file  Tobacco Use   Smoking status: Every Day    Current packs/day: 1.00    Types: Cigarettes   Smokeless tobacco: Never  Vaping Use   Vaping status: Never Used  Substance and Sexual Activity   Alcohol use: Yes    Comment: 40 ounces daily   Drug use: Yes    Types: Cocaine, Marijuana, "Crack" cocaine   Sexual activity: Yes    Partners: Male  Other Topics Concern   Not on file  Social History Narrative   Not on file   Social Determinants of Health   Financial Resource Strain: Not on file  Food Insecurity: No Food Insecurity (02/22/2023)   Hunger Vital  Sign    Worried About Programme researcher, broadcasting/film/video in the Last Year: Never true    Ran Out of Food in the Last Year: Never true  Transportation Needs: No Transportation Needs (02/22/2023)   PRAPARE - Administrator, Civil Service (Medical): No    Lack of Transportation (Non-Medical): No  Physical Activity: Not on file  Stress: Not on file  Social Connections: Unknown (01/06/2023)   Received from [facility]   Social Network    Social Network: Not on file    Sleep: Fair  Appetite:  Good  Current Medications: Current Facility-Administered Medications  Medication Dose Route Frequency Provider Last Rate Last Admin   acetaminophen (TYLENOL) tablet 650 mg  650 mg Oral Q6H PRN Rancour, Jeannett Senior, MD   650 mg at 03/12/23 0443   albuterol (PROVENTIL) (2.5 MG/3ML) 0.083% nebulizer solution 2.5 mg  2.5 mg Nebulization Q6H PRN Gwyneth Sprout, MD   2.5 mg at 03/07/23 1801   albuterol (VENTOLIN HFA) 108 (90 Base) MCG/ACT inhaler 2 puff  2 puff Inhalation Q6H PRN Tegeler, Canary Brim, MD   2 puff at 03/12/23 1227   ARIPiprazole (ABILIFY) tablet 10 mg  10 mg Oral Daily Eligha Bridegroom, NP   10 mg at 03/12/23 0945   diphenhydrAMINE  (BENADRYL) capsule 50 mg  50 mg Oral Q6H PRN Lenox Ponds, NP       Or   diphenhydrAMINE (BENADRYL) injection 50 mg  50 mg Intramuscular Q6H PRN Lenox Ponds, NP   50 mg at 03/12/23 1142   famotidine (PEPCID) tablet 20 mg  20 mg Oral Daily Gwyneth Sprout, MD   20 mg at 03/12/23 0944   ferrous sulfate tablet 325 mg  325 mg Oral BID WC Pricilla Loveless, MD       fluticasone furoate-vilanterol (BREO ELLIPTA) 200-25 MCG/ACT 1 puff  1 puff Inhalation Daily Gwyneth Sprout, MD   1 puff at 03/12/23 1117   folic acid (FOLVITE) tablet 1 mg  1 mg Oral Daily Ophelia Shoulder E, NP   1 mg at 03/12/23 0944   furosemide (LASIX) tablet 20 mg  20 mg Oral Daily Gwyneth Sprout, MD   20 mg at 03/12/23 0945   gabapentin (NEURONTIN) capsule 300 mg  300 mg Oral TID Ophelia Shoulder E, NP   300 mg at 03/12/23 0944   haloperidol (HALDOL) tablet 5 mg  5 mg Oral Q6H PRN Lenox Ponds, NP       Or   haloperidol lactate (HALDOL) injection 5 mg  5 mg Intramuscular Q6H PRN Lenox Ponds, NP   5 mg at 03/12/23 1142   LORazepam (ATIVAN) injection 2 mg  2 mg Intramuscular Once Lorre Nick, MD       LORazepam (ATIVAN) tablet 2 mg  2 mg Oral Q6H PRN Lenox Ponds, NP   2 mg at 03/12/23 0443   Or   LORazepam (ATIVAN) injection 2 mg  2 mg Intramuscular Q6H PRN Lenox Ponds, NP   2 mg at 03/12/23 1142   LORazepam (ATIVAN) tablet 1-4 mg  1-4 mg Oral Q1H PRN Melene Plan, DO   2 mg at 03/11/23 1322   metFORMIN (GLUCOPHAGE-XR) 24 hr tablet 750 mg  750 mg Oral Q breakfast Ophelia Shoulder E, NP   750 mg at 03/12/23 0944   nicotine (NICODERM CQ - dosed in mg/24 hours) patch 21 mg  21 mg Transdermal Daily Gloris Manchester, MD   21 mg at 03/12/23 [phone]  nicotine polacrilex (NICORETTE) gum 2 mg  2 mg Oral PRN Melene Plan, DO   2 mg at 03/11/23 0923   ondansetron (ZOFRAN) tablet 4 mg  4 mg Oral Q6H Ophelia Shoulder E, NP   4 mg at 03/12/23 1155   sucralfate (CARAFATE) tablet 1 g  1 g Oral TID WC & HS Ophelia Shoulder E, NP   1 g at  03/12/23 1155   thiamine (VITAMIN B1) tablet 100 mg  100 mg Oral Daily Ophelia Shoulder E, NP   100 mg at 03/12/23 1610   Or   thiamine (VITAMIN B1) injection 100 mg  100 mg Intravenous Daily Chales Abrahams, NP       Current Outpatient Medications  Medication Sig Dispense Refill   acetaminophen (TYLENOL) 325 MG tablet Take 650 mg by mouth every 6 (six) hours as needed for mild pain.     albuterol (VENTOLIN HFA) 108 (90 Base) MCG/ACT inhaler Inhale 1-2 puffs into the lungs every 6 (six) hours as needed for wheezing or shortness of breath. 1 each 0   budesonide-formoterol (SYMBICORT) 160-4.5 MCG/ACT inhaler Inhale 2 puffs into the lungs 2 (two) times daily. 10.2 g 2   diclofenac Sodium (VOLTAREN) 1 % GEL Apply 2 g topically 4 (four) times daily. (Patient not taking: Reported on 03/07/2023)     famotidine (PEPCID) 20 MG tablet Take 1 tablet (20 mg total) by mouth daily. (Patient not taking: Reported on 03/07/2023) 30 tablet 0   furosemide (LASIX) 20 MG tablet Take 1 tablet (20 mg total) by mouth daily. (Patient not taking: Reported on 03/07/2023) 2 tablet 0   gabapentin (NEURONTIN) 100 MG capsule Take 100 mg by mouth 3 (three) times daily. (Patient not taking: Reported on 03/07/2023)     hyoscyamine (LEVSIN/SL) 0.125 MG SL tablet Place 1 tablet (0.125 mg total) under the tongue every 4 (four) hours as needed (pain). Up to 1.25 mg daily (Patient not taking: Reported on 02/22/2023) 20 tablet 0   metFORMIN (GLUCOPHAGE-XR) 750 MG 24 hr tablet Take 1 tablet (750 mg total) by mouth daily. (Patient not taking: Reported on 03/07/2023) 30 tablet 0   ondansetron (ZOFRAN) 4 MG tablet Take 1 tablet (4 mg total) by mouth every 6 (six) hours. (Patient not taking: Reported on 02/22/2023) 12 tablet 0   ondansetron (ZOFRAN-ODT) 4 MG disintegrating tablet Take 1 tablet (4 mg total) by mouth every 8 (eight) hours as needed for nausea or vomiting. (Patient not taking: Reported on 03/07/2023) 8 tablet 0   OXYGEN Inhale 4-5 L into the  lungs as needed (COPD).     sucralfate (CARAFATE) 1 g tablet Take 1 tablet (1 g total) by mouth 4 (four) times daily -  with meals and at bedtime. (Patient not taking: Reported on 02/22/2023) 90 tablet 0   tiZANidine (ZANAFLEX) 2 MG tablet Take 2 mg by mouth at bedtime. (Patient not taking: Reported on 03/07/2023)     zonisamide (ZONEGRAN) 100 MG capsule Take 2 capsules (200 mg total) by mouth daily. (Patient not taking: Reported on 02/22/2023) 30 capsule 3    Lab Results:  Results for orders placed or performed during the hospital encounter of 03/06/23 (from the past 48 hour(s))  Vitamin B12     Status: None   Collection Time: 03/12/23  9:30 AM  Result Value Ref Range   Vitamin B-12 212 180 - 914 pg/mL    Comment: (NOTE) This assay is not validated for testing neonatal or myeloproliferative syndrome specimens for Vitamin B12  levels. Performed at [facility], [address] [place], [place] [address]   Folate     Status: None   Collection Time: 03/12/23  9:30 AM  Result Value Ref Range   Folate 25.2 >5.9 ng/mL    Comment: Performed at [facility] Lab, [address] [place], [place] [address]  Iron and TIBC     Status: Abnormal   Collection Time: 03/12/23  9:30 AM  Result Value Ref Range   Iron <5 (L) 28 - 170 ug/dL   TIBC 829 (H) 562 - 130 ug/dL   Saturation Ratios NOT CALCULATED 10.4 - 31.8 %   UIBC NOT CALCULATED ug/dL    Comment: Performed at [facility] Lab, [address] [place], [place] [address]  Ferritin     Status: Abnormal   Collection Time: 03/12/23  9:30 AM  Result Value Ref Range   Ferritin 7 (L) 11 - 307 ng/mL    Comment: Performed at [facility], [address] [place], [place] [address]  Reticulocytes     Status: Abnormal   Collection Time: 03/12/23  9:30 AM  Result Value Ref Range   Retic Ct Pct 2.0 0.4 - 3.1 %   RBC. 3.20 (L) 3.87 - 5.11 MIL/uL   Retic Count, Absolute 64.3 19.0 - 186.0 K/uL   Immature Retic Fract 20.3 (H) 2.3 - 15.9 %     Comment: Performed at [facility] Lab, [address] [place], [place] [address]  Comprehensive metabolic panel     Status: Abnormal   Collection Time: 03/12/23  9:30 AM  Result Value Ref Range   Sodium 131 (L) 135 - 145 mmol/L   Potassium 3.6 3.5 - 5.1 mmol/L   Chloride 96 (L) 98 - 111 mmol/L   CO2 25 22 - 32 mmol/L   Glucose, Bld 200 (H) 70 - 99 mg/dL    Comment: Glucose reference range applies only to samples taken after fasting for at least 8 hours.   BUN 7 6 - 20 mg/dL   Creatinine, Ser 1.32 0.44 - 1.00 mg/dL   Calcium 8.6 (L) 8.9 - 10.3 mg/dL   Total Protein 6.6 6.5 - 8.1 g/dL   Albumin 3.0 (L) 3.5 - 5.0 g/dL   AST 21 15 - 41 U/L   ALT 17 0 - 44 U/L   Alkaline Phosphatase 65 38 - 126 U/L   Total Bilirubin 0.4 0.3 - 1.2 mg/dL   GFR, Estimated >44 >01 mL/min    Comment: (NOTE) Calculated using the CKD-EPI Creatinine Equation (2021)    Anion gap 10 5 - 15    Comment: Performed at [facility] Lab, [address], [place], [place] [address]  CBC with Differential     Status: Abnormal   Collection Time: 03/12/23  9:30 AM  Result Value Ref Range   WBC 7.6 4.0 - 10.5 K/uL   RBC 3.22 (L) 3.87 - 5.11 MIL/uL   Hemoglobin 7.2 (L) 12.0 - 15.0 g/dL   HCT 36.6 (L) 44.0 - 34.7 %   MCV 79.8 (L) 80.0 - 100.0 fL   MCH 22.4 (L) 26.0 - 34.0 pg   MCHC 28.0 (L) 30.0 - 36.0 g/dL   RDW 42.5 (H) 95.6 - 38.7 %   Platelets 356 150 - 400 K/uL   nRBC 0.0 0.0 - 0.2 %   Neutrophils Relative % 66 %   Neutro Abs 5.0 1.7 - 7.7 K/uL   Lymphocytes Relative 25 %  Lymphs Abs 1.9 0.7 - 4.0 K/uL   Monocytes Relative 7 %   Monocytes Absolute 0.5 0.1 - 1.0 K/uL   Eosinophils Relative 1 %   Eosinophils Absolute 0.1 0.0 - 0.5 K/uL   Basophils Relative 1 %   Basophils Absolute 0.0 0.0 - 0.1 K/uL   Immature Granulocytes 0 %   Abs Immature Granulocytes 0.02 0.00 - 0.07 K/uL    Comment: Performed at [facility] Lab, [address], [place], [place] [address]    Blood Alcohol level:  Lab  Results  Component Value Date   ETH 193 (H) 03/06/2023   ETH <10 02/25/2023    Physical Findings:  CIWA:  CIWA-Ar Total: 0 COWS:     Musculoskeletal: Strength & Muscle Tone: within normal limits Gait & Station: normal Patient leans: N/A  Psychiatric Specialty Exam:  Presentation  General Appearance:  Appropriate for Environment  Eye Contact: Fair  Speech: Clear and Coherent  Speech Volume: Normal  Handedness: Right   Mood and Affect  Mood: Dysphoric  Affect: Other (comment) (Neutral to mildly constricted)   Thought Process  Thought Processes: Goal Directed  Descriptions of Associations:Intact  Orientation:Full (Time, Place and Person)  Thought Content:Logical; Delusions  History of Schizophrenia/Schizoaffective disorder:Yes  Duration of Psychotic Symptoms:Greater than six months  Hallucinations:Hallucinations: None  Ideas of Reference:None  Suicidal Thoughts:Suicidal Thoughts: No  Homicidal Thoughts:Homicidal Thoughts: No   Sensorium  Memory: Immediate Fair; Recent Fair; Remote Fair  Judgment: Impaired  Insight: Lacking; Shallow; Poor   Executive Functions  Concentration: Fair  Attention Span: Fair  Recall: [employer] of Knowledge: Fair  Language: Fair   Psychomotor Activity  Psychomotor Activity: Psychomotor Activity: Normal   Assets  Assets: Housing; Social Support; Manufacturing systems engineer; Transportation; Financial Resources/Insurance; Resilience; Leisure Time; Intimacy   Sleep  Sleep: Sleep: Good    Physical Exam: Physical Exam Vitals and nursing note reviewed.  Constitutional:      General: She is not in acute distress.    Appearance: She is not ill-appearing, toxic-appearing or diaphoretic.  Pulmonary:     Effort: Pulmonary effort is normal.  Skin:    General: Skin is warm and dry.  Neurological:     Mental Status: She is alert and oriented to person, place, and time.     Motor: No tremor.   Psychiatric:        Attention and Perception: Attention normal. She perceives visual (Reports seeing bugs in her room) hallucinations. She does not perceive auditory hallucinations.        Speech: Speech normal.        Behavior: Behavior is cooperative.        Thought Content: Thought content is delusional. Thought content does not include homicidal or suicidal ideation.        Cognition and Memory: Cognition and memory normal.        Judgment: Judgment is impulsive and inappropriate.    Review of Systems  All other systems reviewed and are negative.  Blood pressure 125/67, pulse (!) 113, temperature 98.2 F (36.8 C), temperature source Oral, resp. rate 19, height 5\' 2"  (1.575 m), weight 56 kg, last menstrual period 01/08/2011, SpO2 100%. Body mass index is 22.58 kg/m.   Medical Decision Making:  Upon reevaluation today, patient presents with severe discharge focus, expression of limited insight into the need for treatment, irritability and dysphoric mood, endorsements of seeing bugs in her room, and selective compliance with medications.  Plan remains the same, recommendation is for inpatient  psychiatric hospitalization upon obtaining of disposition.  Recommendations   -Recommend continue to seek inpatient hospitalization for mental health -Recommend continue current medications  -Recommend continue CIWA protocol  Lenox Ponds, NP 03/12/2023, 2:55 PM

## 2023-03-12 NOTE — Progress Notes (Signed)
LCSW Progress Note  [id number]   [name]  03/12/2023  12:39 PM    Inpatient Behavioral Health Placement  Pt meets inpatient criteria per , NP. There are no available beds within [facility] system per [facility]. Referral was sent to the following facilities;   Destination  Service Provider Address Phone Fax  [facility]  [address] [place] [address] [phone]  [facility] Center-Adult  [address] [place] [address] [phone]  [facility]  [address] [place] [address] [phone]  [facility]  [address] [place] [address] [phone]  [facility]  [address] [place] [address] [phone]  [facility]  [address], [place] [address] [phone]  [facility]  [address] [place] [address] [phone]  [facility] Health Patient Placement  [facility], [place] [phone]  [facility]  [address] [place], [place] [address] [phone]  [facility]  [address] [place], [place] [address] [phone]  [facility]  [address] [place] [address] [phone]  [facility]  [address] [place] [address] [phone]  [facility]  [address], [place] [address] [phone]  [facility]  [address], [place] [address] [phone]  [facility]  [address] [place], [place] [address] [phone]  [facility]  [address] [place] [address] [phone]  [facility] Center-Geriatric  [address] [place] [address] [phone]  [facility]  [address] [place] [address] [phone]  [facility]  [address] [place] [address] [phone]  [facility] BED Management Behavioral Health  [place] [phone]  [facility] EFAX  [address] [place], [place] [phone]  [facility]  [address], [place] [address] [phone]  [facility]  [address], [place] [address] [phone]  [facility]  [address] [place] [address] [phone]  [facility] Inpatient EFAX  [place] [phone]  [facility]  [address] [place] [address] [phone]  [facility] [address]  [place] [address] [phone]  [facility]  [address] [place] [address] [phone]  [facility]  [address], [place] [address] [phone]  [facility]  [address] [place] [address] [phone]  [facility]  [address] [place] [address] [phone]  [facility]  [address] [place], [place] [address] [phone]  [facility]  [address] [place] [address] [phone]  [facility]  [facility] Regino [place]ellow [place] [address] [phone]  [facility]  [address] [place] [address] [phone]  [facility]  [address] [place] [address] [phone]     Situation ongoing,  CSW will follow up.    Maryjean Ka, MSW, [facility] 03/12/2023 12:39 PM

## 2023-03-12 NOTE — Progress Notes (Signed)
This CSW received a phone call from [facility] Intake [phone] who informed that pt was denied due to use of O2. Per [facility] Intake [place] uses a system Pensions consultant and it is noted that pt has home O2 because of COPD. CSW was not informed on the dates of information that was listed through [facility] view in the [place] system.  Per chart review and confirmation with assigned nurse Garey Ham pt is not currently using O2 and  room air. Pt has a PRN albuterol inhaler; also confirmed QM:VHQI. CSW requested assistance from nursing Garey Ham and Irving Burton Dixion, RN to send Vitals via fax to [facility] [phone].   Garey Ham confirmed that VS were faxed to [facility].  1st shift CSW to follow up with [facility] Team notified: Garey Ham and Irving Burton Dixion, RN, Cathie Beams, LCSW, Arsenio Loader, NP   Maryjean Ka, MSW, [facility] 03/12/2023 2:02 AM

## 2023-03-12 NOTE — ED Notes (Signed)
During shift report, pt asked for coffee and responded well when this RN told her that it would be here w/ breakfast at about 0730. Pt then asked for coffee again and was told the same and then stated, "I am leaving and started walking out of purple zone into green zone. Pt was walking slowly w/ steady gait. This behavior is strictly behavioral in response to not getting coffee when she wanted it. This is a frequent behavior of this pt, she doesn't get what she wants when she wants it and then tries to leave. Security escorted pt back to purple zone and to her room. Pt was verbally aggressive at time of behavior. Pt calmed self back down. No meds were needed.

## 2023-03-12 NOTE — Progress Notes (Signed)
Per [facility] Intake pt has been denied due to Medical acuity. CSW/Disposition team will continue to assist with inpatient behavioral health placement.  Care Team notified: Garey Ham and Irving Burton Dixion, RN, Cathie Beams, LCSW, Arsenio Loader, NP   Maryjean Ka, MSW, [facility] 03/12/2023 2:50 AM

## 2023-03-12 NOTE — ED Notes (Signed)
Pt not in restraints when assuming pt care at this time.

## 2023-03-12 NOTE — ED Notes (Signed)
Patient up pacing the floor asking for "shot" to help her back pain; pt is increasing in anxiety; PRN meds given-Monique,RN

## 2023-03-12 NOTE — Progress Notes (Signed)
LCSW Progress Note  [id number]   [name]  03/12/2023  1:20 AM    Inpatient Behavioral Health Placement  Pt meets inpatient criteria per Arsenio Loader, NP. There are no available beds [facility] system per [facility] Rona Ravens, RN. Referral was sent to the following facilities;   Destination  Service Provider Address Phone [place]  [address] [place] [place] [address] [phone]  [facility]  [address] [place] [address] [phone]  [facility] Center-Adult  [address] [place] [address] [phone]  [facility]  [address] [place] [address] [phone]  [facility]  [address] [place] [address] [phone]  [facility]  [address], [place] [address] [phone]  [facility]  [address] [place] [address] [phone]  [facility]  [address] [address] [phone]  [facility] Health Patient Placement  [facility], [place] [phone]  [facility]  [address] [place], [place] [address] [phone]  [facility]  [address] [place], [place] [address] [phone]  [facility]  [address] [place] [address] [phone]  [facility]  [address], [place] [address] [phone]  [facility]  [address] [place] [address] [phone]  [facility]  [address], [place] [address]  [phone]  [facility]  [address] [place], [place] [address] [phone]  [facility]  [address], [place] [address] [phone]  [facility] Center-Geriatric  [address] [place] [address] [phone]  [facility]  [address] [place] [address] [phone]  [facility]  [address] [place] [address] [phone]  [facility] BED Management Behavioral Health  [place] [phone]  [facility] EFAX  [address] [place], [place] [phone]  [facility]  [address], [place] [address] [phone]  [facility]  [address] [place], [place] [address] [phone]  [facility]  [address], [place] [address] [phone]  [facility] Psychiatry Inpatient EFAX  [place] [phone]  [facility]  [address] [place] [address] [phone]  [facility] [address]  [place] [address] [phone]  [facility]  [address] [place] [address] [phone]  [facility]  [address] [place] [address] [phone]  [facility]  [address], [place] [address] [phone]  [facility]  [address], [place] [address] [phone]  [facility]  [address] [place], [place] [address] [phone]  [facility]  [address] [place] [address] [phone]  [facility]  1  [facility] Regino Bellow [place] [address] [phone]  [facility]  [address], [place] [address] [phone]    Situation ongoing,  CSW will follow up.    Maryjean Ka, MSW, LCSWA 03/12/2023 1:20 AM

## 2023-03-12 NOTE — ED Notes (Signed)
Pt became agitated after being given ice water that the ice melted. Sitter and pt bumping heads over ice water and pt was threatening to leave. This RN gave pt ice water and explained that the sitter had given her ice water, but the ice melted. Encouraged pt to take deep breaths and try not to leave when she gets frustrated because it will delay pt getting out of here. Pt asking for medication to "calm me down".

## 2023-03-13 DIAGNOSIS — Z7984 Long term (current) use of oral hypoglycemic drugs: Secondary | ICD-10-CM | POA: Diagnosis not present

## 2023-03-13 DIAGNOSIS — E538 Deficiency of other specified B group vitamins: Secondary | ICD-10-CM | POA: Diagnosis not present

## 2023-03-13 DIAGNOSIS — F17210 Nicotine dependence, cigarettes, uncomplicated: Secondary | ICD-10-CM | POA: Diagnosis not present

## 2023-03-13 DIAGNOSIS — Z79899 Other long term (current) drug therapy: Secondary | ICD-10-CM | POA: Diagnosis not present

## 2023-03-13 DIAGNOSIS — Z96641 Presence of right artificial hip joint: Secondary | ICD-10-CM | POA: Diagnosis not present

## 2023-03-13 DIAGNOSIS — J449 Chronic obstructive pulmonary disease, unspecified: Secondary | ICD-10-CM | POA: Diagnosis not present

## 2023-03-13 DIAGNOSIS — F250 Schizoaffective disorder, bipolar type: Secondary | ICD-10-CM | POA: Diagnosis not present

## 2023-03-13 DIAGNOSIS — I4891 Unspecified atrial fibrillation: Secondary | ICD-10-CM | POA: Diagnosis not present

## 2023-03-13 DIAGNOSIS — E039 Hypothyroidism, unspecified: Secondary | ICD-10-CM | POA: Diagnosis not present

## 2023-03-13 DIAGNOSIS — D649 Anemia, unspecified: Secondary | ICD-10-CM | POA: Diagnosis present

## 2023-03-13 DIAGNOSIS — E119 Type 2 diabetes mellitus without complications: Secondary | ICD-10-CM | POA: Diagnosis not present

## 2023-03-13 DIAGNOSIS — I10 Essential (primary) hypertension: Secondary | ICD-10-CM | POA: Diagnosis not present

## 2023-03-13 DIAGNOSIS — E871 Hypo-osmolality and hyponatremia: Secondary | ICD-10-CM | POA: Diagnosis not present

## 2023-03-13 DIAGNOSIS — D62 Acute posthemorrhagic anemia: Secondary | ICD-10-CM | POA: Diagnosis not present

## 2023-03-13 LAB — RAPID URINE DRUG SCREEN, HOSP PERFORMED
Amphetamines: NOT DETECTED
Barbiturates: NOT DETECTED
Benzodiazepines: POSITIVE — AB
Cocaine: NOT DETECTED
Opiates: NOT DETECTED
Tetrahydrocannabinol: NOT DETECTED

## 2023-03-13 LAB — URINALYSIS, ROUTINE W REFLEX MICROSCOPIC
Bilirubin Urine: NEGATIVE
Glucose, UA: NEGATIVE mg/dL
Hgb urine dipstick: NEGATIVE
Ketones, ur: NEGATIVE mg/dL
Nitrite: NEGATIVE
Protein, ur: NEGATIVE mg/dL
Specific Gravity, Urine: 1.005 (ref 1.005–1.030)
pH: 5 (ref 5.0–8.0)

## 2023-03-13 LAB — CBC
HCT: 24.9 % — ABNORMAL LOW (ref 36.0–46.0)
HCT: 28.6 % — ABNORMAL LOW (ref 36.0–46.0)
Hemoglobin: 7 g/dL — ABNORMAL LOW (ref 12.0–15.0)
Hemoglobin: 8.3 g/dL — ABNORMAL LOW (ref 12.0–15.0)
MCH: 22.4 pg — ABNORMAL LOW (ref 26.0–34.0)
MCH: 23.6 pg — ABNORMAL LOW (ref 26.0–34.0)
MCHC: 28.1 g/dL — ABNORMAL LOW (ref 30.0–36.0)
MCHC: 29 g/dL — ABNORMAL LOW (ref 30.0–36.0)
MCV: 79.8 fL — ABNORMAL LOW (ref 80.0–100.0)
MCV: 81.3 fL (ref 80.0–100.0)
Platelets: 305 10*3/uL (ref 150–400)
Platelets: 244 10*3/uL (ref 150–400)
RBC: 3.12 MIL/uL — ABNORMAL LOW (ref 3.87–5.11)
RBC: 3.52 MIL/uL — ABNORMAL LOW (ref 3.87–5.11)
RDW: 20 % — ABNORMAL HIGH (ref 11.5–15.5)
RDW: 20.3 % — ABNORMAL HIGH (ref 11.5–15.5)
WBC: 7.1 10*3/uL (ref 4.0–10.5)
WBC: 7.3 10*3/uL (ref 4.0–10.5)
nRBC: 0 % (ref 0.0–0.2)
nRBC: 0 % (ref 0.0–0.2)

## 2023-03-13 LAB — GLUCOSE, CAPILLARY
Glucose-Capillary: 135 mg/dL — ABNORMAL HIGH (ref 70–99)
Glucose-Capillary: 255 mg/dL — ABNORMAL HIGH (ref 70–99)

## 2023-03-13 LAB — HEMOGLOBIN AND HEMATOCRIT, BLOOD
HCT: 21.6 % — ABNORMAL LOW (ref 36.0–46.0)
Hemoglobin: 6.2 g/dL — CL (ref 12.0–15.0)

## 2023-03-13 LAB — POC OCCULT BLOOD, ED: Fecal Occult Bld: NEGATIVE

## 2023-03-13 LAB — CBG MONITORING, ED: Glucose-Capillary: 145 mg/dL — ABNORMAL HIGH (ref 70–99)

## 2023-03-13 MED ORDER — INSULIN ASPART 100 UNIT/ML IJ SOLN
0.0000 [IU] | Freq: Three times a day (TID) | INTRAMUSCULAR | Status: DC
  Administered 2023-03-13 – 2023-03-14 (×2): 2 [IU] via SUBCUTANEOUS
  Administered 2023-03-14 – 2023-03-16 (×3): 3 [IU] via SUBCUTANEOUS

## 2023-03-13 MED ORDER — PANTOPRAZOLE SODIUM 40 MG IV SOLR
40.0000 mg | INTRAVENOUS | Status: DC
  Administered 2023-03-13 – 2023-03-15 (×3): 40 mg via INTRAVENOUS
  Filled 2023-03-13 (×3): qty 10

## 2023-03-13 MED ORDER — HALOPERIDOL LACTATE 5 MG/ML IJ SOLN
5.0000 mg | Freq: Once | INTRAMUSCULAR | Status: AC
  Administered 2023-03-13: 5 mg via INTRAMUSCULAR
  Filled 2023-03-13: qty 1

## 2023-03-13 MED ORDER — CYANOCOBALAMIN 1000 MCG/ML IJ SOLN
1000.0000 ug | Freq: Every day | INTRAMUSCULAR | Status: DC
  Administered 2023-03-14 – 2023-03-16 (×2): 1000 ug via INTRAMUSCULAR
  Filled 2023-03-13 (×3): qty 1

## 2023-03-13 MED ORDER — HALOPERIDOL LACTATE 5 MG/ML IJ SOLN
2.0000 mg | Freq: Once | INTRAMUSCULAR | Status: AC
  Administered 2023-03-13: 2 mg via INTRAMUSCULAR

## 2023-03-13 NOTE — Consult Note (Signed)
Consultation  Referring Provider: [facility]/ Artis Flock  Primary Care Physician:  Fleet Contras, MD Primary Gastroenterologist:  unassigned  Reason for Consultation: Anemia, drop in hemoglobin, heme negative  HPI: [name] is a 57 y.o. female brought in yesterday for involuntary commitment due to aggressive behavior, and concerns per patient's daughter that she "took a whole bunch of pills and tried to kill herself".  Patient has had numerous emergency room visits this year, and has had very frequent admissions over the past couple of years.  She does have history of EtOH and crack cocaine abuse, schizophrenia, COPD with respiratory failure and chronic oxygen use, adult onset diabetes mellitus, history of atrial fibrillation for which she is not anticoagulated She is status post remote gastric bypass,, she underwent exploratory lap with lysis of adhesions and resection of a small bowel stricture October 2023.  It was noted on labs yesterday that she has a microcytic anemia and there was concern for drop in hemoglobin over the past couple of weeks. Did have CT of the abdomen and pelvis on 03/01/2023 showing her to be status post gastric bypass otherwise unremarkable exam.  Iron studies yesterday showed serum iron of less than 5/TIBC 552/ferritin 7/folate 25/B12 212 Reviewing her labs 01/31/2023 hemoglobin 9.6 02/14/2023 hemoglobin 7.9 02/25/2020 hemoglobin 8.1 02/27/2023 hemoglobin 10.2 03/01/2023 hemoglobin 7.9 03/06/2023 hemoglobin 7.5 03/12/2023 hemoglobin 7.2 03/13/2023 hemoglobin 7 hematocrit 24.9 MCV of 79 and platelets 305  Documented Hemoccult negative  Patient is not seen and examined today as she is agitated, and on psych hold.   Past Medical History:  Diagnosis Date   Abdominal pain    Accidental drug overdose April 2013   Anxiety    Atrial fibrillation (HCC) 09/29/11   converted spontaneously   Chronic back pain    Chronic knee pain    Chronic nausea    Chronic pain    COPD  (chronic obstructive pulmonary disease) (HCC)    Depression    Diabetes mellitus    states her doctor took her off all DM meds in past month   Diabetic neuropathy (HCC)    Dyspnea    with exertion    GERD (gastroesophageal reflux disease)    Headache(784.0)    migraines    HTN (hypertension)    not on meds since in a year    Hyperlipidemia    Hypothyroidism    not on meds in a while    Mental disorder    Bipolar and schizophrenic   Nausea and vomiting 01/02/2023   Requires supplemental oxygen    as needed per patient    Schizophrenia (HCC)    Schizophrenia, acute (HCC) 11/13/2017   Tobacco abuse     Past Surgical History:  Procedure Laterality Date   ABDOMINAL HYSTERECTOMY     BLADDER SUSPENSION  03/04/2011   Procedure: [facility] PROCEDURE;  Surgeon: Jonna Coup MacDiarmid;  Location: [facility];  Service: Urology;  Laterality: N/A;   BOWEL RESECTION N/A 04/18/2022   Procedure: SMALL BOWEL RESECTION;  Surgeon: Harriette Bouillon, MD;  Location: [facility] OR;  Service: General;  Laterality: N/A;   CYSTOCELE REPAIR  03/04/2011   Procedure: ANTERIOR REPAIR (CYSTOCELE);  Surgeon: Martina Sinner;  Location: [facility];  Service: Urology;  Laterality: N/A;   CYSTOSCOPY  03/04/2011   Procedure: CYSTOSCOPY;  Surgeon: Jonna Coup MacDiarmid;  Location: [facility];  Service: Urology;  Laterality: N/A;   ESOPHAGOGASTRODUODENOSCOPY (EGD) WITH PROPOFOL N/A 05/12/2017   Procedure: ESOPHAGOGASTRODUODENOSCOPY (EGD) WITH PROPOFOL;  Surgeon: Ezzard Standing,  Onalee Hua, MD;  Location: [facility];  Service: General;  Laterality: N/A;   GASTRIC ROUX-EN-Y N/A 03/25/2016   Procedure: LAPAROSCOPIC ROUX-EN-Y GASTRIC BYPASS WITH UPPER ENDOSCOPY;  Surgeon: Glenna Fellows, MD;  Location: [facility];  Service: General;  Laterality: N/A;   KNEE SURGERY     LAPAROSCOPIC ASSISTED VAGINAL HYSTERECTOMY  03/04/2011   Procedure: LAPAROSCOPIC ASSISTED VAGINAL HYSTERECTOMY;  Surgeon: Jeani Hawking, MD;  Location: [facility];  Service: Gynecology;  Laterality:  N/A;   LAPAROTOMY N/A 04/18/2022   Procedure: EXPLORATORY LAPAROTOMY;  Surgeon: Harriette Bouillon, MD;  Location: [facility] OR;  Service: General;  Laterality: N/A;   LAPAROTOMY N/A 04/24/2022   Procedure: BRING BACK EXPLORATORY LAPAROTOMY;  Surgeon: Violeta Gelinas, MD;  Location: [facility] OR;  Service: General;  Laterality: N/A;   TOTAL HIP ARTHROPLASTY Right 08/27/2022   Procedure: TOTAL HIP ARTHROPLASTY;  Surgeon: Joen Laura, MD;  Location: [facility] OR;  Service: Orthopedics;  Laterality: Right;    Prior to Admission medications   Medication Sig Start Date End Date Taking? Authorizing Provider  acetaminophen (TYLENOL) 325 MG tablet Take 650 mg by mouth every 6 (six) hours as needed for mild pain.   Yes [provider]  albuterol (VENTOLIN HFA) 108 (90 Base) MCG/ACT inhaler Inhale 1-2 puffs into the lungs every 6 (six) hours as needed for wheezing or shortness of breath. 02/25/23  Yes Sabas Sous, MD  budesonide-formoterol [facility]) 160-4.5 MCG/ACT inhaler Inhale 2 puffs into the lungs 2 (two) times daily. 02/25/23  Yes Sabas Sous, MD  diclofenac Sodium (VOLTAREN) 1 % GEL Apply 2 g topically 4 (four) times daily. Patient not taking: Reported on 03/07/2023 02/19/23   [provider]  famotidine (PEPCID) 20 MG tablet Take 1 tablet (20 mg total) by mouth daily. Patient not taking: Reported on 03/07/2023 02/25/23   Sabas Sous, MD  furosemide (LASIX) 20 MG tablet Take 1 tablet (20 mg total) by mouth daily. Patient not taking: Reported on 03/07/2023 02/24/23   Achille Rich, PA-C  gabapentin (NEURONTIN) 100 MG capsule Take 100 mg by mouth 3 (three) times daily. Patient not taking: Reported on 03/07/2023 02/19/23   [provider]  hyoscyamine (LEVSIN/SL) 0.125 MG SL tablet Place 1 tablet (0.125 mg total) under the tongue every 4 (four) hours as needed (pain). Up to 1.25 mg daily Patient not taking: Reported on 02/22/2023 01/23/23   Arthor Captain, PA-C  metFORMIN (GLUCOPHAGE-XR) 750  MG 24 hr tablet Take 1 tablet (750 mg total) by mouth daily. Patient not taking: Reported on 03/07/2023 02/25/23 03/27/23  Sabas Sous, MD  ondansetron (ZOFRAN) 4 MG tablet Take 1 tablet (4 mg total) by mouth every 6 (six) hours. Patient not taking: Reported on 02/22/2023 01/20/23   Prosperi, Christian H, PA-C  ondansetron (ZOFRAN-ODT) 4 MG disintegrating tablet Take 1 tablet (4 mg total) by mouth every 8 (eight) hours as needed for nausea or vomiting. Patient not taking: Reported on 03/07/2023 03/01/23   Laurence Spates, MD  OXYGEN Inhale 4-5 L into the lungs as needed (COPD).    [provider]  sucralfate (CARAFATE) 1 g tablet Take 1 tablet (1 g total) by mouth 4 (four) times daily -  with meals and at bedtime. Patient not taking: Reported on 02/22/2023 02/11/23   Horton, Mayer Masker, MD  tiZANidine (ZANAFLEX) 2 MG tablet Take 2 mg by mouth at bedtime. Patient not taking: Reported on 03/07/2023 02/19/23   [provider]  zonisamide (ZONEGRAN) 100 MG capsule Take 2  capsules (200 mg total) by mouth daily. Patient not taking: Reported on 02/22/2023 01/12/23   Lewie Chamber, MD    Current Facility-Administered Medications  Medication Dose Route Frequency Provider Last Rate Last Admin   acetaminophen (TYLENOL) tablet 650 mg  650 mg Oral Q6H PRN Rancour, Stephen, MD   650 mg at 03/13/23 0801   albuterol (PROVENTIL) (2.5 MG/3ML) 0.083% nebulizer solution 2.5 mg  2.5 mg Nebulization Q6H PRN Gwyneth Sprout, MD   2.5 mg at 03/07/23 1801   albuterol (VENTOLIN HFA) 108 (90 Base) MCG/ACT inhaler 2 puff  2 puff Inhalation Q6H PRN Tegeler, Canary Brim, MD   2 puff at 03/13/23 0801   ARIPiprazole (ABILIFY) tablet 10 mg  10 mg Oral Daily Eligha Bridegroom, NP   10 mg at 03/13/23 0981   cyanocobalamin (VITAMIN B12) injection 1,000 mcg  1,000 mcg Intramuscular Daily Orland Mustard, MD       diphenhydrAMINE (BENADRYL) capsule 50 mg  50 mg Oral Q6H PRN Lenox Ponds, NP   50 mg at 03/13/23 0801    Or   diphenhydrAMINE (BENADRYL) injection 50 mg  50 mg Intramuscular Q6H PRN Lenox Ponds, NP   50 mg at 03/13/23 0131   famotidine (PEPCID) tablet 20 mg  20 mg Oral Daily Gwyneth Sprout, MD   20 mg at 03/13/23 1914   ferrous sulfate tablet 325 mg  325 mg Oral BID WC Pricilla Loveless, MD   325 mg at 03/13/23 0801   fluticasone furoate-vilanterol (BREO ELLIPTA) 200-25 MCG/ACT 1 puff  1 puff Inhalation Daily Gwyneth Sprout, MD   1 puff at 03/13/23 0802   folic acid (FOLVITE) tablet 1 mg  1 mg Oral Daily Ophelia Shoulder E, NP   1 mg at 03/13/23 7829   furosemide (LASIX) tablet 20 mg  20 mg Oral Daily Gwyneth Sprout, MD   20 mg at 03/13/23 5621   gabapentin (NEURONTIN) capsule 300 mg  300 mg Oral TID Ophelia Shoulder E, NP   300 mg at 03/13/23 3086   haloperidol (HALDOL) tablet 5 mg  5 mg Oral Q6H PRN Lenox Ponds, NP   5 mg at 03/13/23 5784   Or   haloperidol lactate (HALDOL) injection 5 mg  5 mg Intramuscular Q6H PRN Lenox Ponds, NP   5 mg at 03/13/23 0131   insulin aspart (novoLOG) injection 0-15 Units  0-15 Units Subcutaneous TID WC Orland Mustard, MD       LORazepam (ATIVAN) injection 2 mg  2 mg Intramuscular Once Lorre Nick, MD       LORazepam (ATIVAN) tablet 2 mg  2 mg Oral Q6H PRN Lenox Ponds, NP   2 mg at 03/12/23 0443   Or   LORazepam (ATIVAN) injection 2 mg  2 mg Intramuscular Q6H PRN Lenox Ponds, NP   2 mg at 03/13/23 1255   LORazepam (ATIVAN) tablet 1-4 mg  1-4 mg Oral Q1H PRN Melene Plan, DO   1 mg at 03/13/23 0935   nicotine (NICODERM CQ - dosed in mg/24 hours) patch 21 mg  21 mg Transdermal Daily Gloris Manchester, MD   21 mg at 03/13/23 0931   nicotine polacrilex (NICORETTE) gum 2 mg  2 mg Oral PRN Melene Plan, DO   2 mg at 03/11/23 0923   ondansetron (ZOFRAN) tablet 4 mg  4 mg Oral Q6H Ophelia Shoulder E, NP   4 mg at 03/13/23 1315   pantoprazole (PROTONIX) injection 40 mg  40 mg Intravenous Q24H Artis Flock,  Revonda Standard, MD   40 mg at 03/13/23 1315   sucralfate (CARAFATE)  tablet 1 g  1 g Oral TID WC & HS Ophelia Shoulder E, NP   1 g at 03/13/23 0801   thiamine (VITAMIN B1) tablet 100 mg  100 mg Oral Daily Ophelia Shoulder E, NP   100 mg at 03/13/23 7829   Or   thiamine (VITAMIN B1) injection 100 mg  100 mg Intravenous Daily Chales Abrahams, NP       Current Outpatient Medications  Medication Sig Dispense Refill   acetaminophen (TYLENOL) 325 MG tablet Take 650 mg by mouth every 6 (six) hours as needed for mild pain.     albuterol (VENTOLIN HFA) 108 (90 Base) MCG/ACT inhaler Inhale 1-2 puffs into the lungs every 6 (six) hours as needed for wheezing or shortness of breath. 1 each 0   budesonide-formoterol (SYMBICORT) 160-4.5 MCG/ACT inhaler Inhale 2 puffs into the lungs 2 (two) times daily. 10.2 g 2   diclofenac Sodium (VOLTAREN) 1 % GEL Apply 2 g topically 4 (four) times daily. (Patient not taking: Reported on 03/07/2023)     famotidine (PEPCID) 20 MG tablet Take 1 tablet (20 mg total) by mouth daily. (Patient not taking: Reported on 03/07/2023) 30 tablet 0   furosemide (LASIX) 20 MG tablet Take 1 tablet (20 mg total) by mouth daily. (Patient not taking: Reported on 03/07/2023) 2 tablet 0   gabapentin (NEURONTIN) 100 MG capsule Take 100 mg by mouth 3 (three) times daily. (Patient not taking: Reported on 03/07/2023)     hyoscyamine (LEVSIN/SL) 0.125 MG SL tablet Place 1 tablet (0.125 mg total) under the tongue every 4 (four) hours as needed (pain). Up to 1.25 mg daily (Patient not taking: Reported on 02/22/2023) 20 tablet 0   metFORMIN (GLUCOPHAGE-XR) 750 MG 24 hr tablet Take 1 tablet (750 mg total) by mouth daily. (Patient not taking: Reported on 03/07/2023) 30 tablet 0   ondansetron (ZOFRAN) 4 MG tablet Take 1 tablet (4 mg total) by mouth every 6 (six) hours. (Patient not taking: Reported on 02/22/2023) 12 tablet 0   ondansetron (ZOFRAN-ODT) 4 MG disintegrating tablet Take 1 tablet (4 mg total) by mouth every 8 (eight) hours as needed for nausea or vomiting. (Patient not taking:  Reported on 03/07/2023) 8 tablet 0   OXYGEN Inhale 4-5 L into the lungs as needed (COPD).     sucralfate (CARAFATE) 1 g tablet Take 1 tablet (1 g total) by mouth 4 (four) times daily -  with meals and at bedtime. (Patient not taking: Reported on 02/22/2023) 90 tablet 0   tiZANidine (ZANAFLEX) 2 MG tablet Take 2 mg by mouth at bedtime. (Patient not taking: Reported on 03/07/2023)     zonisamide (ZONEGRAN) 100 MG capsule Take 2 capsules (200 mg total) by mouth daily. (Patient not taking: Reported on 02/22/2023) 30 capsule 3    Allergies as of 03/06/2023 - Review Complete 03/06/2023  Allergen Reaction Noted   Iron dextran Shortness Of Breath and Anxiety 11/14/2021   Aspirin Nausea And Vomiting and Other (See Comments) 02/18/2011   Penicillins Other (See Comments) 11/21/2022    Family History  Problem Relation Age of Onset   Heart attack Father        40s   Diabetes Mother    Heart disease Mother    Hypertension Mother    Heart attack Sister        73   COPD Other    Breast cancer Neg Hx  Social History   Socioeconomic History   Marital status: Legally Separated    Spouse name: Not on file   Number of children: Not on file   Years of education: Not on file   Highest education level: Not on file  Occupational History   Not on file  Tobacco Use   Smoking status: Every Day    Current packs/day: 1.00    Types: Cigarettes   Smokeless tobacco: Never  Vaping Use   Vaping status: Never Used  Substance and Sexual Activity   Alcohol use: Yes    Comment: 40 ounces daily   Drug use: Yes    Types: Cocaine, Marijuana, "Crack" cocaine   Sexual activity: Yes    Partners: Male  Other Topics Concern   Not on file  Social History Narrative   Not on file   Social Determinants of Health   Financial Resource Strain: Not on file  Food Insecurity: No Food Insecurity (02/22/2023)   Hunger Vital Sign    Worried About Running Out of Food in the Last Year: Never true    Ran Out of Food in  the Last Year: Never true  Transportation Needs: No Transportation Needs (02/22/2023)   PRAPARE - Administrator, Civil Service (Medical): No    Lack of Transportation (Non-Medical): No  Physical Activity: Not on file  Stress: Not on file  Social Connections: Unknown (01/06/2023)   [facility] from [facility]   Social Network    Social Network: Not on file  Intimate Partner Violence: Not At Risk (02/22/2023)   Humiliation, Afraid, Rape, and Kick questionnaire    Fear of Current or Ex-Partner: No    Emotionally Abused: No    Physically Abused: No    Sexually Abused: No    Review of Systems:   Physical Exam: Vital signs in last 24 hours: Temp:  [98.3 F (36.8 C)-98.4 F (36.9 C)] 98.4 F (36.9 C) (09/20 0932) Pulse Rate:  [91-106] 94 (09/20 0932) Resp:  [16-18] 17 (09/20 0932) BP: (118-127)/(66-76) 127/66 (09/20 0932) SpO2:  [98 %-99 %] 98 % (09/20 0932)   Not examined  Intake/Output from previous day: 09/19 0701 - 09/20 0700 In: 240 [P.O.:240] Out: -  Intake/Output this shift: No intake/output data recorded.  Lab Results: Recent Labs    03/12/23 0930 03/13/23 0832 03/13/23 1142  WBC 7.6 7.1 7.3  HGB 7.2* 7.0* 8.3*  HCT 25.7* 24.9* 28.6*  PLT 356 305 244   BMET Recent Labs    03/12/23 0930  NA 131*  K 3.6  CL 96*  CO2 25  GLUCOSE 200*  BUN 7  CREATININE 0.50  CALCIUM 8.6*   LFT Recent Labs    03/12/23 0930  PROT 6.6  ALBUMIN 3.0*  AST 21  ALT 17  ALKPHOS 65  BILITOT 0.4   PT/INR No results for input(s): "LABPROT", "INR" in the last 72 hours. Hepatitis Panel No results for input(s): "HEPBSAG", "HCVAB", "HEPAIGM", "HEPBIGM" in the last 72 hours.    IMPRESSION:    #102 57 year old female with history of schizophrenia, currently admitted on involuntary commitment/psych hold  #2 chronic microcytic iron deficiency anemia, also has borderline low B12 level After careful review of her labs she is about at her baseline and really has  not had any significant drop in hemoglobin over the past 2 months.  Hemoccult negative  Patient is status post gastric bypass and this may account for both her iron deficiency anemia secondary to malabsorption  #3  COPD with chronic resp failure with chronic oxygen use #4  Atrial fibrillation/no anticoagulation 5.  Hypertension 6.  History of adult onset diabetes mellitus #7 status post gastric bypass patient had exploratory lap with lysis of adhesions and resection of a small bowel stricture 2023  Plan; patient does not eat any inpatient workup of her chronic iron deficiency anemia She does need to be on iron and B12 replacement. May benefit from IV iron during admission. We can offer her outpatient follow-up with her current psychiatric issues this may be difficult      Dean Wonder PA-C 03/13/2023, 2:08 PM

## 2023-03-13 NOTE — ED Notes (Signed)
Per Ivory Broad, Charge RN no beds with monitor available at this time for pt to be moved into. Pt to remain in purple zone unmonitored.

## 2023-03-13 NOTE — ED Notes (Signed)
MD at bedside assessing patient

## 2023-03-13 NOTE — Assessment & Plan Note (Addendum)
TSH wnl 02/2023 On no medication  Check TPO Ab outpatient

## 2023-03-13 NOTE — Assessment & Plan Note (Signed)
Recent A1C of 7.4 Hold metformin SSI and acccuhecks QAC/HS

## 2023-03-13 NOTE — H&P (Signed)
History and Physical    Patient: [name] NUU:[id number] DOB: 1965/12/14 DOA: 03/06/2023 DOS: the patient was seen and examined on 03/13/2023 PCP: Fleet Contras, MD  Patient coming from:  ED   - lives with her mother/family    Chief Complaint: low hemoglobin   HPI: [name] is a 57 y.o. female with medical history significant of  schizophrenia, COPD, DM type II and chronic alcohol use presented to ED for psychiatric evaluation but was found to have downtrending hgb and [facility] was asked to admit.  She tells me she has no overt bleeding. Denies any N/V/D, change in bowels. Denies blood in stool or dark/tarry stool. She has no wounds. Denies blood in her urine. Denies any abdominal pain.    She smokes 2 PPD/drinks 2 1/2 40 ounces/beer daily.   ER Course:  vitals: afebrile, bp: 127/66, HR; 94, RR: 17, oxygen: 98%RA Pertinent labs: hgb 7.2 (10.2 two weeks ago) sodium: 131, glucose: 200, B12: 212, iron <5, TIBC 552, ferritin 7,  In ED: started on CIWA protocol. Psychiatry consulted. [facility] asked to admit for drop in hgb.   Review of Systems: As mentioned in the history of present illness. All other systems reviewed and are negative. Past Medical History:  Diagnosis Date   Abdominal pain    Accidental drug overdose April 2013   Anxiety    Atrial fibrillation (HCC) 09/29/11   converted spontaneously   Chronic back pain    Chronic knee pain    Chronic nausea    Chronic pain    COPD (chronic obstructive pulmonary disease) (HCC)    Depression    Diabetes mellitus    states her doctor took her off all DM meds in past month   Diabetic neuropathy (HCC)    Dyspnea    with exertion    GERD (gastroesophageal reflux disease)    Headache(784.0)    migraines    HTN (hypertension)    not on meds since in a year    Hyperlipidemia    Hypothyroidism    not on meds in a while    Mental disorder    Bipolar and schizophrenic   Nausea and vomiting 01/02/2023   Requires supplemental oxygen     as needed per patient    Schizophrenia (HCC)    Schizophrenia, acute (HCC) 11/13/2017   Tobacco abuse    Past Surgical History:  Procedure Laterality Date   ABDOMINAL HYSTERECTOMY     BLADDER SUSPENSION  03/04/2011   Procedure: [facility] PROCEDURE;  Surgeon: Jonna Coup MacDiarmid;  Location: [facility];  Service: Urology;  Laterality: N/A;   BOWEL RESECTION N/A 04/18/2022   Procedure: SMALL BOWEL RESECTION;  Surgeon: Harriette Bouillon, MD;  Location: [facility] OR;  Service: General;  Laterality: N/A;   CYSTOCELE REPAIR  03/04/2011   Procedure: ANTERIOR REPAIR (CYSTOCELE);  Surgeon: Martina Sinner;  Location: [facility];  Service: Urology;  Laterality: N/A;   CYSTOSCOPY  03/04/2011   Procedure: CYSTOSCOPY;  Surgeon: Jonna Coup MacDiarmid;  Location: [facility];  Service: Urology;  Laterality: N/A;   ESOPHAGOGASTRODUODENOSCOPY (EGD) WITH PROPOFOL N/A 05/12/2017   Procedure: ESOPHAGOGASTRODUODENOSCOPY (EGD) WITH PROPOFOL;  Surgeon: Ovidio Kin, MD;  Location: [facility];  Service: General;  Laterality: N/A;   GASTRIC ROUX-EN-Y N/A 03/25/2016   Procedure: LAPAROSCOPIC ROUX-EN-Y GASTRIC BYPASS WITH UPPER ENDOSCOPY;  Surgeon: Glenna Fellows, MD;  Location: [facility];  Service: General;  Laterality: N/A;   KNEE SURGERY     LAPAROSCOPIC ASSISTED VAGINAL HYSTERECTOMY  03/04/2011  Procedure: LAPAROSCOPIC ASSISTED VAGINAL HYSTERECTOMY;  Surgeon: Jeani Hawking, MD;  Location: [facility];  Service: Gynecology;  Laterality: N/A;   LAPAROTOMY N/A 04/18/2022   Procedure: EXPLORATORY LAPAROTOMY;  Surgeon: Harriette Bouillon, MD;  Location: [facility];  Service: General;  Laterality: N/A;   LAPAROTOMY N/A 04/24/2022   Procedure: BRING BACK EXPLORATORY LAPAROTOMY;  Surgeon: Violeta Gelinas, MD;  Location: [facility] OR;  Service: General;  Laterality: N/A;   TOTAL HIP ARTHROPLASTY Right 08/27/2022   Procedure: TOTAL HIP ARTHROPLASTY;  Surgeon: Joen Laura, MD;  Location: [facility] OR;  Service: Orthopedics;  Laterality: Right;   Social History:   reports that she has been smoking cigarettes. She has never used smokeless tobacco. She reports current alcohol use. She reports current drug use. Drugs: Cocaine, Marijuana, and "Crack" cocaine.  Allergies  Allergen Reactions   Iron Dextran Shortness Of Breath and Anxiety   Aspirin Nausea And Vomiting and Other (See Comments)    Ok to take tylenol or ibuprofen    Penicillins Other (See Comments)    Unknown. Childhood reaction     Family History  Problem Relation Age of Onset   Heart attack Father        33s   Diabetes Mother    Heart disease Mother    Hypertension Mother    Heart attack Sister        35   COPD Other    Breast cancer Neg Hx     Prior to Admission medications   Medication Sig Start Date End Date Taking? Authorizing Provider  acetaminophen (TYLENOL) 325 MG tablet Take 650 mg by mouth every 6 (six) hours as needed for mild pain.   Yes [provider]  albuterol (VENTOLIN HFA) 108 (90 Base) MCG/ACT inhaler Inhale 1-2 puffs into the lungs every 6 (six) hours as needed for wheezing or shortness of breath. 02/25/23  Yes Sabas Sous, MD  budesonide-formoterol [facility]) 160-4.5 MCG/ACT inhaler Inhale 2 puffs into the lungs 2 (two) times daily. 02/25/23  Yes Sabas Sous, MD  diclofenac Sodium (VOLTAREN) 1 % GEL Apply 2 g topically 4 (four) times daily. Patient not taking: Reported on 03/07/2023 02/19/23   [provider]  famotidine (PEPCID) 20 MG tablet Take 1 tablet (20 mg total) by mouth daily. Patient not taking: Reported on 03/07/2023 02/25/23   Sabas Sous, MD  furosemide (LASIX) 20 MG tablet Take 1 tablet (20 mg total) by mouth daily. Patient not taking: Reported on 03/07/2023 02/24/23   Achille Rich, PA-C  gabapentin (NEURONTIN) 100 MG capsule Take 100 mg by mouth 3 (three) times daily. Patient not taking: Reported on 03/07/2023 02/19/23   [provider]  hyoscyamine (LEVSIN/SL) 0.125 MG SL tablet Place 1 tablet (0.125 mg total) under  the tongue every 4 (four) hours as needed (pain). Up to 1.25 mg daily Patient not taking: Reported on 02/22/2023 01/23/23   Arthor Captain, PA-C  metFORMIN (GLUCOPHAGE-XR) 750 MG 24 hr tablet Take 1 tablet (750 mg total) by mouth daily. Patient not taking: Reported on 03/07/2023 02/25/23 03/27/23  Sabas Sous, MD  ondansetron (ZOFRAN) 4 MG tablet Take 1 tablet (4 mg total) by mouth every 6 (six) hours. Patient not taking: Reported on 02/22/2023 01/20/23   Prosperi, Christian H, PA-C  ondansetron (ZOFRAN-ODT) 4 MG disintegrating tablet Take 1 tablet (4 mg total) by mouth every 8 (eight) hours as needed for nausea or vomiting. Patient not taking: Reported on 03/07/2023 03/01/23   Laurence Spates, MD  OXYGEN Inhale 4-5 L into the lungs as needed (COPD).    [provider]  sucralfate (CARAFATE) 1 g tablet Take 1 tablet (1 g total) by mouth 4 (four) times daily -  with meals and at bedtime. Patient not taking: Reported on 02/22/2023 02/11/23   Horton, Mayer Masker, MD  tiZANidine (ZANAFLEX) 2 MG tablet Take 2 mg by mouth at bedtime. Patient not taking: Reported on 03/07/2023 02/19/23   [provider]  zonisamide (ZONEGRAN) 100 MG capsule Take 2 capsules (200 mg total) by mouth daily. Patient not taking: Reported on 02/22/2023 01/12/23   Lewie Chamber, MD    Physical Exam: Vitals:   03/12/23 2120 03/13/23 0711 03/13/23 0740 03/13/23 0932  BP: 118/76  120/66 127/66  Pulse: (!) 106  91 94  Resp: 18  16 17   Temp: 98.4 F (36.9 C)  98.3 F (36.8 C) 98.4 F (36.9 C)  TempSrc: Oral  Oral Oral  SpO2: 99% 99% 99% 98%  Weight:      Height:       General:  Appears calm and comfortable and is in NAD Eyes:  PERRL, EOMI, normal lids, iris ENT:  grossly normal hearing, lips & tongue, mmm; appropriate dentition Neck:  no LAD, masses or thyromegaly; no carotid bruits Cardiovascular:  RRR, no m/r/g. No LE edema.  Respiratory:   CTA bilaterally with no wheezes/rales/rhonchi.  Normal respiratory  effort. Abdomen:  soft, NT, ND, NABS Back:   normal alignment, no CVAT Skin:  no rash or induration seen on limited exam Musculoskeletal:  grossly normal tone BUE/BLE, good ROM, no bony abnormality Lower extremity:  No LE edema.  Limited foot exam with no ulcerations.  2+ distal pulses. Psychiatric:  grossly normal mood and affect, speech fluent and appropriate, AOx3 Neurologic:  CN 2-12 grossly intact, moves all extremities in coordinated fashion, sensation intact   Radiological Exams on Admission: Independently reviewed - see discussion in A/P where applicable  No results found.  EKG: pending    Labs on Admission: I have personally reviewed the available labs and imaging studies at the time of the admission.  Pertinent labs:   hgb 7.2 (10.2 two weeks ago)  sodium: 131,  glucose: 200,  B12: 212,  iron <5, TIBC 552, ferritin 7  Assessment and Plan: Principal Problem:   Acute on chronic anemia Active Problems:   Alcohol use disorder, severe, dependence (HCC)   B12 deficiency   COPD (chronic obstructive pulmonary disease) (HCC)   Essential hypertension   Hyponatremia   Polysubstance abuse (HCC)   Schizoaffective disorder, bipolar type (HCC)   Substance induced mood disorder (HCC)   Type 2 diabetes mellitus (HCC)   GERD (gastroesophageal reflux disease)   Hypothyroidism    Assessment and Plan: * Acute on chronic anemia 57 year old female presenting to ED for psychiatric eval found to have acute on chronic anemia with hgb to 7.0. No acute bleeding and denies any sources of bleeding.  -obs to progressive -hgb was 10.2 about 2 weeks ago -baseline around 8-10.  -hx of ACD, on iron but unsure  how compliant she is with this  -B12 low, repleting -iron studies done with ACD -check UA  -type and screen -fecal occult negative  -with alcohol use concern for more gastritis/etc.  -GI consulted -cbc q 6 hours -transfuse to keep hgb >7.0    Alcohol use disorder, severe,  dependence (HCC) Drinks 2.5 40 ounce beers/day On CIWA protocol  Place in progressive  B12 deficiency B12  of 212 in setting of chronic alcohol use Start IM replacement while inpatient   COPD (chronic obstructive pulmonary disease) (HCC) No s/sx of exacerbation. Clear on exam  Continue maintenance breo and SABA prn   Essential hypertension Well controlled. Continue lasix 20mg  daily  Hyponatremia Sodium 131, likely due to beer potomania Stable, continue to monitor If declines, check urine studies.   Polysubstance abuse (HCC) Check UDS  Nicotine patch and is quite agitated she can not smoke   Schizoaffective disorder, bipolar type (HCC) IVC in place, psychiatry will place again if deem necessary. Expires today Continue medication per psychiatry   Substance induced mood disorder [facility]) Psychiatry consulted   Type 2 diabetes mellitus (HCC) Recent A1C of 7.4 Hold metformin SSI and acccuhecks QAC/HS   GERD (gastroesophageal reflux disease) On pepcid 20mg  daily  Change to protonix with alcohol hx and acute on chronic anemia  Continue carafate   Hypothyroidism TSH wnl 02/2023 On no medication  Check TPO Ab outpatient     Advance Care Planning:   Code Status: Full Code   Consults: Gi/psychiatry   DVT Prophylaxis: SCD  Family Communication: none  Severity of Illness: The appropriate patient status for this patient is OBSERVATION. Observation status is judged to be reasonable and necessary in order to provide the required intensity of service to ensure the patient's safety. The patient's presenting symptoms, physical exam findings, and initial radiographic and laboratory data in the context of their medical condition is felt to place them at decreased risk for further clinical deterioration. Furthermore, it is anticipated that the patient will be medically stable for discharge from the hospital within 2 midnights of admission.   Author: Orland Mustard, MD 03/13/2023  12:27 PM  For on call review [email or web address].

## 2023-03-13 NOTE — Progress Notes (Signed)
   03/13/23 1655  Assess: MEWS Score  Temp 98.1 F (36.7 C)  BP 118/63  MAP (mmHg) 80  Pulse Rate (!) 112  Resp 18  SpO2 100 %  O2 Device Room Air  Assess: MEWS Score  MEWS Temp 0  MEWS Systolic 0  MEWS Pulse 2  MEWS RR 0  MEWS LOC 0  MEWS Score 2  MEWS Score Color Yellow  Assess: if the MEWS score is Yellow or Red  Were vital signs accurate and taken at a resting state? Yes  Does the patient meet 2 or more of the SIRS criteria? No  Does the patient have a confirmed or suspected source of infection? No  MEWS guidelines implemented  Yes, yellow  Treat  MEWS Interventions Considered administering scheduled or prn medications/treatments as ordered  Take Vital Signs  Increase Vital Sign Frequency  Yellow: Q2hr x1, continue Q4hrs until patient remains green for 12hrs  Escalate  MEWS: Escalate Yellow: Discuss with charge nurse and consider notifying provider and/or RRT  Notify: Charge Nurse/RN  Name of Charge Nurse/RN Notified Diplomatic Services operational officer  Assess: SIRS CRITERIA  SIRS Temperature  0  SIRS Pulse 1  SIRS Respirations  0  SIRS WBC 0  SIRS Score Sum  1

## 2023-03-13 NOTE — Assessment & Plan Note (Signed)
Well controlled.  Continue lasix 20 mg daily

## 2023-03-13 NOTE — ED Notes (Signed)
Ivory Broad, charge RN made aware that pt will be admitted under progressive acuity and needs to be moved to an area where she can be placed onto monitor. Awaiting for room assignment for pt.

## 2023-03-13 NOTE — ED Notes (Signed)
PT IVC paperwork complete confirmation number is [id number] . Original paper in red folder. Other 3 copies sent up to tube [address]. Notified NS nema

## 2023-03-13 NOTE — Progress Notes (Signed)
Patient had ate half of her meal this evenig and brought it back up.

## 2023-03-13 NOTE — ED Notes (Signed)
ED TO INPATIENT HANDOFF REPORT  ED Nurse Name and Phone #: Nehemiah Settle [phone]  S Name/Age/Gender [name] 57 y.o. female Room/Bed: 052C/052C  Code Status   Code Status: Full Code  Home/SNF/Other Home Patient oriented to: self and place Is this baseline? No   Triage Complete: Triage complete  Chief Complaint Acute on chronic anemia [D64.9]  Triage Note No notes on file   Allergies Allergies  Allergen Reactions   Iron Dextran Shortness Of Breath and Anxiety   Aspirin Nausea And Vomiting and Other (See Comments)    Ok to take tylenol or ibuprofen    Penicillins Other (See Comments)    Unknown. Childhood reaction     Level of Care/Admitting Diagnosis ED Disposition     ED Disposition  Admit   Condition  --   Comment  Hospital Area: [facility] [[id number]]  Level of Care: Progressive [102]  Admit to Progressive based on following criteria: ACUTE MENTAL DISORDER-RELATED Drug/Alcohol Ingestion/Overdose/Withdrawal, Suicidal Ideation/attempt requiring safety sitter and < Q2h monitoring/assessments, moderate to severe agitation that is managed with medication/sitter, CIWA-Ar score < 20.  Admit to Progressive based on following criteria: GI, ENDOCRINE disease patients with GI bleeding, acute liver failure or pancreatitis, stable with diabetic ketoacidosis or thyrotoxicosis (hypothyroid) state.  May place patient in observation at [facility] or [facility] if equivalent level of care is available:: No  Covid Evaluation: Asymptomatic - no recent exposure (last 10 days) testing not required  Diagnosis: Acute on chronic anemia [[id number]]  Admitting Physician: Orland Mustard [[id number]]  Attending Physician: Orland Mustard [[id number]]          B Medical/Surgery History Past Medical History:  Diagnosis Date   Abdominal pain    Accidental drug overdose April 2013   Anxiety    Atrial fibrillation (HCC) 09/29/11   converted spontaneously   Chronic back pain     Chronic knee pain    Chronic nausea    Chronic pain    COPD (chronic obstructive pulmonary disease) (HCC)    Depression    Diabetes mellitus    states her doctor took her off all DM meds in past month   Diabetic neuropathy (HCC)    Dyspnea    with exertion    GERD (gastroesophageal reflux disease)    Headache(784.0)    migraines    HTN (hypertension)    not on meds since in a year    Hyperlipidemia    Hypothyroidism    not on meds in a while    Mental disorder    Bipolar and schizophrenic   Nausea and vomiting 01/02/2023   Requires supplemental oxygen    as needed per patient    Schizophrenia (HCC)    Schizophrenia, acute (HCC) 11/13/2017   Tobacco abuse    Past Surgical History:  Procedure Laterality Date   ABDOMINAL HYSTERECTOMY     BLADDER SUSPENSION  03/04/2011   Procedure: [facility] PROCEDURE;  Surgeon: Jonna Coup MacDiarmid;  Location: [facility] ORS;  Service: Urology;  Laterality: N/A;   BOWEL RESECTION N/A 04/18/2022   Procedure: SMALL BOWEL RESECTION;  Surgeon: Harriette Bouillon, MD;  Location: [facility] OR;  Service: General;  Laterality: N/A;   CYSTOCELE REPAIR  03/04/2011   Procedure: ANTERIOR REPAIR (CYSTOCELE);  Surgeon: Martina Sinner;  Location: [facility];  Service: Urology;  Laterality: N/A;   CYSTOSCOPY  03/04/2011   Procedure: CYSTOSCOPY;  Surgeon: Jonna Coup MacDiarmid;  Location: [facility];  Service: Urology;  Laterality: N/A;   ESOPHAGOGASTRODUODENOSCOPY (  EGD) WITH PROPOFOL N/A 05/12/2017   Procedure: ESOPHAGOGASTRODUODENOSCOPY (EGD) WITH PROPOFOL;  Surgeon: Ovidio Kin, MD;  Location: [facility];  Service: General;  Laterality: N/A;   GASTRIC ROUX-EN-Y N/A 03/25/2016   Procedure: LAPAROSCOPIC ROUX-EN-Y GASTRIC BYPASS WITH UPPER ENDOSCOPY;  Surgeon: Glenna Fellows, MD;  Location: [facility];  Service: General;  Laterality: N/A;   KNEE SURGERY     LAPAROSCOPIC ASSISTED VAGINAL HYSTERECTOMY  03/04/2011   Procedure: LAPAROSCOPIC ASSISTED VAGINAL HYSTERECTOMY;  Surgeon: Jeani Hawking, MD;  Location: [facility];  Service: Gynecology;  Laterality: N/A;   LAPAROTOMY N/A 04/18/2022   Procedure: EXPLORATORY LAPAROTOMY;  Surgeon: Harriette Bouillon, MD;  Location: [facility] OR;  Service: General;  Laterality: N/A;   LAPAROTOMY N/A 04/24/2022   Procedure: BRING BACK EXPLORATORY LAPAROTOMY;  Surgeon: Violeta Gelinas, MD;  Location: [facility] OR;  Service: General;  Laterality: N/A;   TOTAL HIP ARTHROPLASTY Right 08/27/2022   Procedure: TOTAL HIP ARTHROPLASTY;  Surgeon: Joen Laura, MD;  Location: [facility] OR;  Service: Orthopedics;  Laterality: Right;     A IV Location/Drains/Wounds Patient Lines/Drains/Airways Status     Active Line/Drains/Airways     Name Placement date Placement time Site Days   Peripheral IV 03/13/23 20 G Right Antecubital 03/13/23  1315  Antecubital  less than 1   Wound / Incision (Open or Dehisced) 12/11/22  Thigh Anterior;Left bruise on left hip, 12/11/22  0400  Thigh  92   Wound / Incision (Open or Dehisced) 12/11/22  Abdomen Lower;Medial 12/11/22  0400  Abdomen  92   Wound / Incision (Open or Dehisced) 12/11/22  Vertebral column Upper 12/11/22  0400  Vertebral column  92            Intake/Output Last 24 hours  Intake/Output Summary (Last 24 hours) at 03/13/2023 1522 Last data filed at 03/12/2023 1805 Gross per 24 hour  Intake 240 ml  Output --  Net 240 ml    Labs/Imaging Results for orders placed or performed during the hospital encounter of 03/06/23 (from the past 48 hour(s))  Vitamin B12     Status: None   Collection Time: 03/12/23  9:30 AM  Result Value Ref Range   Vitamin B-12 212 180 - 914 pg/mL    Comment: (NOTE) This assay is not validated for testing neonatal or myeloproliferative syndrome specimens for Vitamin B12 levels. Performed at [facility], [address], [place], [place] [address]   Folate     Status: None   Collection Time: 03/12/23  9:30 AM  Result Value Ref Range   Folate 25.2 >5.9 ng/mL    Comment: Performed at  [facility], [address] [place], [place] [address]  Iron and TIBC     Status: Abnormal   Collection Time: 03/12/23  9:30 AM  Result Value Ref Range   Iron <5 (L) 28 - 170 ug/dL   TIBC 425 (H) 956 - 387 ug/dL   Saturation Ratios NOT CALCULATED 10.4 - 31.8 %   UIBC NOT CALCULATED ug/dL    Comment: Performed at [facility], [address] [place], [place] [address]  Ferritin     Status: Abnormal   Collection Time: 03/12/23  9:30 AM  Result Value Ref Range   Ferritin 7 (L) 11 - 307 ng/mL    Comment: Performed at [facility], [address], [place], [place] [address]  Reticulocytes     Status: Abnormal   Collection Time: 03/12/23  9:30 AM  Result Value Ref Range   Retic Ct Pct 2.0 0.4 - 3.1 %   RBC. 3.20 (L) 3.87 - 5.11 MIL/uL   Retic Count, Absolute 64.3 19.0 - 186.0 K/uL   Immature Retic Fract 20.3 (H) 2.3 - 15.9 %    Comment: Performed at [facility], [address], [place], [place] [address]  Comprehensive metabolic panel     Status: Abnormal   Collection Time: 03/12/23  9:30 AM  Result Value Ref Range   Sodium 131 (L) 135 - 145 mmol/L   Potassium 3.6 3.5 - 5.1 mmol/L   Chloride 96 (L) 98 - 111 mmol/L   CO2 25 22 - 32 mmol/L   Glucose, Bld 200 (H) 70 - 99 mg/dL    Comment: Glucose reference range applies only to samples taken after fasting for at least 8 hours.   BUN 7 6 - 20 mg/dL   Creatinine, Ser 1.91 0.44 - 1.00 mg/dL   Calcium 8.6 (L) 8.9 - 10.3 mg/dL   Total Protein 6.6 6.5 - 8.1 g/dL   Albumin 3.0 (L) 3.5 - 5.0 g/dL   AST 21 15 - 41 U/L   ALT 17 0 - 44 U/L   Alkaline Phosphatase 65 38 - 126 U/L   Total Bilirubin 0.4 0.3 - 1.2 mg/dL   GFR, Estimated >47 >82 mL/min    Comment: (NOTE) Calculated using the CKD-EPI Creatinine Equation (2021)    Anion gap 10 5 - 15    Comment: Performed at [facility], [address] [place], [place] [address]  CBC with Differential     Status: Abnormal   Collection Time: 03/12/23  9:30 AM   Result Value Ref Range   WBC 7.6 4.0 - 10.5 K/uL   RBC 3.22 (L) 3.87 - 5.11 MIL/uL   Hemoglobin 7.2 (L) 12.0 - 15.0 g/dL   HCT 30.8 (L) 65.7 - 84.6 %   MCV 79.8 (L) 80.0 - 100.0 fL   MCH 22.4 (L) 26.0 - 34.0 pg   MCHC 28.0 (L) 30.0 - 36.0 g/dL   RDW 96.2 (H) 95.2 - 84.1 %   Platelets 356 150 - 400 K/uL   nRBC 0.0 0.0 - 0.2 %   Neutrophils Relative % 66 %   Neutro Abs 5.0 1.7 - 7.7 K/uL   Lymphocytes Relative 25 %   Lymphs Abs 1.9 0.7 - 4.0 K/uL   Monocytes Relative 7 %   Monocytes Absolute 0.5 0.1 - 1.0 K/uL   Eosinophils Relative 1 %   Eosinophils Absolute 0.1 0.0 - 0.5 K/uL   Basophils Relative 1 %   Basophils Absolute 0.0 0.0 - 0.1 K/uL   Immature Granulocytes 0 %   Abs Immature Granulocytes 0.02 0.00 - 0.07 K/uL    Comment: Performed at [facility], [address] [place], [place] [address]  CBC     Status: Abnormal   Collection Time: 03/13/23  8:32 AM  Result Value Ref Range   WBC 7.1 4.0 - 10.5 K/uL   RBC 3.12 (L) 3.87 - 5.11 MIL/uL   Hemoglobin 7.0 (L) 12.0 - 15.0 g/dL   HCT 10.2 (L) 72.5 - 36.6 %   MCV 79.8 (L) 80.0 - 100.0 fL   MCH 22.4 (L) 26.0 - 34.0 pg   MCHC 28.1 (L) 30.0 - 36.0 g/dL   RDW 44.0 (H) 34.7 - 42.5 %   Platelets 305 150 - 400 K/uL   nRBC 0.0 0.0 - 0.2 %  Comment: Performed at [facility], [address], [place], [place] [address]  POC occult blood, ED     Status: None   Collection Time: 03/13/23 10:42 AM  Result Value Ref Range   Fecal Occult Bld NEGATIVE NEGATIVE  CBC     Status: Abnormal   Collection Time: 03/13/23 11:42 AM  Result Value Ref Range   WBC 7.3 4.0 - 10.5 K/uL   RBC 3.52 (L) 3.87 - 5.11 MIL/uL   Hemoglobin 8.3 (L) 12.0 - 15.0 g/dL   HCT 11.9 (L) 14.7 - 82.9 %   MCV 81.3 80.0 - 100.0 fL   MCH 23.6 (L) 26.0 - 34.0 pg   MCHC 29.0 (L) 30.0 - 36.0 g/dL   RDW 56.2 (H) 13.0 - 86.5 %   Platelets 244 150 - 400 K/uL   nRBC 0.0 0.0 - 0.2 %    Comment: Performed at [facility], [address]  [place], [place] [address]  CBG monitoring, ED     Status: Abnormal   Collection Time: 03/13/23 12:17 PM  Result Value Ref Range   Glucose-Capillary 145 (H) 70 - 99 mg/dL    Comment: Glucose reference range applies only to samples taken after fasting for at least 8 hours.  Type and screen [facility]     Status: None (Preliminary result)   Collection Time: 03/13/23 12:26 PM  Result Value Ref Range   ABO/RH(D) A POS    Antibody Screen POS    Sample Expiration 03/16/2023,2359    Antibody Identification      ANTI M Performed at [facility], [address], [place], [place] [address]    Unit Number [id number]    Blood Component Type RED CELLS,LR    Unit division 00    Status of Unit ALLOCATED    Donor AG Type NEGATIVE FOR M ANTIGEN    Transfusion Status OK TO TRANSFUSE    Crossmatch Result COMPATIBLE    Unit Number [id number]    Blood Component Type RED CELLS,LR    Unit division 00    Status of Unit ALLOCATED    Donor AG Type NEGATIVE FOR M ANTIGEN    Transfusion Status OK TO TRANSFUSE    Crossmatch Result COMPATIBLE   Urinalysis, Routine w reflex microscopic -Urine, Clean Catch     Status: Abnormal   Collection Time: 03/13/23  2:20 PM  Result Value Ref Range   Color, Urine STRAW (A) YELLOW   APPearance CLEAR CLEAR   Specific Gravity, Urine 1.005 1.005 - 1.030   pH 5.0 5.0 - 8.0   Glucose, UA NEGATIVE NEGATIVE mg/dL   Hgb urine dipstick NEGATIVE NEGATIVE   Bilirubin Urine NEGATIVE NEGATIVE   Ketones, ur NEGATIVE NEGATIVE mg/dL   Protein, ur NEGATIVE NEGATIVE mg/dL   Nitrite NEGATIVE NEGATIVE   Leukocytes,Ua TRACE (A) NEGATIVE   RBC / HPF 0-5 0 - 5 RBC/hpf   WBC, UA 0-5 0 - 5 WBC/hpf   Bacteria, UA RARE (A) NONE SEEN   Squamous Epithelial / HPF 0-5 0 - 5 /HPF    Comment: Performed at [facility], [address], [place], [place] [address]   No results found.  Pending Labs Unresulted Labs (From admission, onward)     Start     Ordered    03/14/23 0500  Basic metabolic panel  Tomorrow morning,   R        03/13/23 1209   03/13/23 1220  Vitamin  B1  Add-on,   AD        03/13/23 1219   03/13/23 1142  CBC  Now then every 6 hours,   R      03/13/23 1141   03/13/23 1042  Rapid urine drug screen (hospital performed)  ONCE - STAT,   STAT        03/13/23 1042            Vitals/Pain Today's Vitals   03/13/23 0720 03/13/23 0740 03/13/23 0847 03/13/23 0932  BP:  120/66  127/66  Pulse:  91  94  Resp:  16  17  Temp:  98.3 F (36.8 C)  98.4 F (36.9 C)  TempSrc:  Oral  Oral  SpO2:  99%  98%  Weight:      Height:      PainSc: 4   3      Isolation Precautions No active isolations  Medications Medications  fluticasone furoate-vilanterol (BREO ELLIPTA) 200-25 MCG/ACT 1 puff (1 puff Inhalation Given 03/13/23 0802)  famotidine (PEPCID) tablet 20 mg (20 mg Oral Given 03/13/23 0921)  furosemide (LASIX) tablet 20 mg (20 mg Oral Given 03/13/23 0921)  albuterol (PROVENTIL) (2.5 MG/3ML) 0.083% nebulizer solution 2.5 mg (2.5 mg Nebulization Patient Refused/Not Given 03/12/23 1216)  acetaminophen (TYLENOL) tablet 650 mg (650 mg Oral Given 03/13/23 0801)  ondansetron (ZOFRAN) tablet 4 mg (4 mg Oral Given 03/13/23 1315)  sucralfate (CARAFATE) tablet 1 g (1 g Oral Patient Refused/Not Given 03/13/23 1318)  LORazepam (ATIVAN) tablet 1-4 mg (2 mg Oral Given 03/10/23 1014)  thiamine (VITAMIN B1) tablet 100 mg (100 mg Oral Given 03/13/23 0922)    Or  thiamine (VITAMIN B1) injection 100 mg ( Intravenous See Alternative 03/13/23 0922)  folic acid (FOLVITE) tablet 1 mg (1 mg Oral Given 03/13/23 0921)  nicotine (NICODERM CQ - dosed in mg/24 hours) patch 21 mg (21 mg Transdermal Patch Applied 03/13/23 0931)  gabapentin (NEURONTIN) capsule 300 mg (300 mg Oral Given 03/13/23 0921)  ARIPiprazole (ABILIFY) tablet 10 mg (10 mg Oral Given 03/13/23 0921)  albuterol (VENTOLIN HFA) 108 (90 Base) MCG/ACT inhaler 2 puff (2 puffs Inhalation Given 03/13/23 0801)   LORazepam (ATIVAN) tablet 1-4 mg (1 mg Oral Given 03/13/23 0935)  nicotine polacrilex (NICORETTE) gum 2 mg (2 mg Oral Given 03/11/23 0923)  LORazepam (ATIVAN) injection 2 mg (2 mg Intramuscular Not Given 03/11/23 1502)  diphenhydrAMINE (BENADRYL) capsule 50 mg (50 mg Oral Given 03/13/23 0801)    Or  diphenhydrAMINE (BENADRYL) injection 50 mg ( Intramuscular See Alternative 03/13/23 0801)  haloperidol (HALDOL) tablet 5 mg (5 mg Oral Given 03/13/23 0801)    Or  haloperidol lactate (HALDOL) injection 5 mg ( Intramuscular See Alternative 03/13/23 0801)  LORazepam (ATIVAN) tablet 2 mg ( Oral See Alternative 03/13/23 1255)    Or  LORazepam (ATIVAN) injection 2 mg (2 mg Intramuscular Given 03/13/23 1255)  ferrous sulfate tablet 325 mg (325 mg Oral Given 03/13/23 0801)  cyanocobalamin (VITAMIN B12) injection 1,000 mcg (1,000 mcg Intramuscular Patient Refused/Not Given 03/13/23 1303)  insulin aspart (novoLOG) injection 0-15 Units ( Subcutaneous Patient Refused/Not Given 03/13/23 1303)  pantoprazole (PROTONIX) injection 40 mg (40 mg Intravenous Given 03/13/23 1315)  haloperidol lactate (HALDOL) injection 5 mg (5 mg Intramuscular Given 03/06/23 2138)  midazolam (VERSED) injection 2 mg (2 mg Intramuscular Given 03/06/23 2137)  ziprasidone (GEODON) injection 20 mg (20 mg Intramuscular Given 03/07/23 0618)  LORazepam (ATIVAN) injection 1 mg (1 mg Intramuscular Given 03/07/23 0617)  sterile water (  preservative free) injection (10 mLs  Given 03/07/23 0618)  nicotine (NICODERM CQ - dosed in mg/24 hours) patch 21 mg (21 mg Transdermal Patch Removed 03/08/23 2148)  midazolam (VERSED) injection 2 mg (2 mg Intramuscular Given 03/08/23 1546)  haloperidol lactate (HALDOL) injection 5 mg (5 mg Intramuscular Given 03/08/23 1535)  diphenhydrAMINE (BENADRYL) injection 25 mg (25 mg Intramuscular Given 03/08/23 1548)  midazolam (VERSED) injection 2 mg (2 mg Intramuscular Given 03/08/23 1738)  LORazepam (ATIVAN) tablet 1 mg (1 mg Oral Given  03/09/23 1122)  sterile water (preservative free) injection (  Given 03/09/23 1948)  sterile water (preservative free) injection (  Given 03/10/23 1952)  sterile water (preservative free) injection (  Given 03/11/23 1124)  haloperidol lactate (HALDOL) injection 2 mg (2 mg Intramuscular Given 03/13/23 1255)    Mobility walks     Focused Assessments    R Recommendations: See Admitting Provider Note  Report given to:   Additional Notes:  pt is under IVC

## 2023-03-13 NOTE — Progress Notes (Signed)
Pt is medically admitted inpatient. This CSW will now remove this pt from the TTS/ BH shift report. Inpatient Psychiatry Consult team will follow.    Maryjean Ka, MSW, [facility] 03/13/2023 5:24 PM

## 2023-03-13 NOTE — ED Provider Notes (Addendum)
Emergency Medicine Observation Re-evaluation Note  [name] is a 57 y.o. female, seen on rounds today.  Pt initially presented to the ED for complaints of Psychiatric Evaluation (Pt presents to the ED accompanied by [facility] as an IVC. Pt handcuffed, cursing, yelling, and aggressive upon arrival to ED. Per IVC paperwork, daughter filed an IVC due to "pt took a lot of pills in an attempt to kill herself. Accusing people of stealing her drugs, pt aggressive and is threatening to injure another's property and cussing at people." Pt uncooperative during triage and not answering questions. ) Currently, the patient is psychiatrically patient has stabilized.  She is reported to currently be at her baseline.  Physical Exam  BP 127/66 (BP Location: Left Arm)   Pulse 94   Temp 98.4 F (36.9 C) (Oral)   Resp 17   Ht 1.575 m (5\' 2" )   Wt 56 kg   LMP 01/08/2011   SpO2 98%   BMI 22.58 kg/m  Physical Exam General: Well-developed well-nourished female in no acute distress Cardiac: Regular rate and rhythm with normal heart rate and blood pressure Lungs: No respiratory distress respiratory rate is normal with normal oxygen saturation Psych: Patient is up and ambulatory is requesting to be discharged  ED Course / MDM  EKG:EKG Interpretation Date/Time:  Friday March 06 2023 22:07:29 EDT Ventricular Rate:  89 PR Interval:  132 QRS Duration:  82 QT Interval:  364 QTC Calculation: 443 R Axis:   43  Text Interpretation: Sinus rhythm Biatrial enlargement Nonspecific T abnrm, anterolateral leads No significant change since last tracing Confirmed by Gwyneth Sprout ([id number]) on 03/06/2023 10:21:26 PM  I have reviewed the labs performed to date as well as medications administered while in observation.  Recent changes in the last 24 hours include patient had consult with pharmacy regarding her iron.  Plan  Current plan is for psychiatry to reevaluate regarding psychiatric stability and  probable discharge Anemia she has had somewhat downward trending hemoglobin.  Reviewed last hospital discharge of 02/23/2023 which actually and AMA.  No reference made to patient's anemia.  Patient does have A-fib but is not reported to be on any anticoagulation.  Iron is less than 5, TIBC 552, ferritin 7, folate 25.  Hemoglobin has trended from 12 down to 7 today. Will order Hemoccult and request medicine consult Discussed with Dr.Wolfe who will see for admission    Margarita Grizzle, MD 03/13/23 1914    Margarita Grizzle, MD 03/13/23 1041

## 2023-03-13 NOTE — ED Notes (Signed)
[employer] just came and spoke with patient about calling the police stating that the hospital will not let her leave.

## 2023-03-13 NOTE — Consult Note (Signed)
[facility] Face-to-Face Psychiatry Consult   Reason for Consult:   Aggressive Behavior  Referring Physician:  Dr. Artis Flock Patient Identification: [name] MRN:  [id number] Principal Diagnosis: Schizoaffective disorder, bipolar type (HCC) Diagnosis:  Principal Problem:   Schizoaffective disorder, bipolar type (HCC) Active Problems:   COPD (chronic obstructive pulmonary disease) (HCC)   Essential hypertension   Type 2 diabetes mellitus (HCC)   Polysubstance abuse (HCC)   Substance induced mood disorder (HCC)   GERD (gastroesophageal reflux disease)   Hypothyroidism   Alcohol use disorder, severe, dependence (HCC)   Hyponatremia   Acute on chronic anemia   B12 deficiency   Total Time spent with patient: 1 hour  Subjective:   [name] is a 57 y.o. female was seen and evaluated face to face by this provider, who she recognizes from prior evals.We are very familiar with this patient on the service line.   The patient was seen today at [facility] Emergency Department for a face-to-face psychiatric reevaluation. During this reevaluation, the patient continued to exhibit severe discharge focus, limited insight into the need for treatment, a dysphoric mood, and reported seeing bugs, as previously noted by family and others.  Today, the patient reported to this provider, as well as to staff and her daughter, that she has been seeing bugs crawling up the wall. She also expressed the belief that her food had been poisoned. When observed, the patient was sitting in a chair at the nursing station, wrapped in a blanket. At that time, she was compliant, eating her lunch, and cooperative. However, shortly after the provider left, nursing staff reported that she became agitated and attempted to leave. The patient continues to experience fluctuating periods of agitation and aggression. She has a history of elopement and leaving against medical advice (AMA). Given that her hemoglobin level has dropped by  3 points and she requires a blood transfusion, the decision was made to continue the involuntary commitment (IVC).  The patient denied having suicidal or homicidal ideations, paranoia, auditory or visual hallucinations, or withdrawal symptoms from severe alcohol use. She reported tolerating her prescribed medications well and denied any side effects. Upon assessment, the patient was oriented, did not appear to be responding to internal or external stimuli, and did not display delusional thoughts. The recommendation for continued medical hospitalization for a blood transfusion was discussed with the patient, and although she initially agreed, she later told the nursing staff that she refused to stay.  Upon physical assessment, no appreciable tremors or other symptoms of alcohol withdrawal were noted. The patient's CIWA scores today ranged from 4 to 8.  Per nursing staff: The patient has been reporting seeing bugs, is selectively compliant with medications, and recently stormed off the [facility] unit in frustration, stating that she was unhappy and wanted to smoke.   HPI: per admission assessment note:  [name] is a 57 y.o. female with medical history significant of  schizophrenia, COPD, DM type II and chronic alcohol use presented to ED for psychiatric evaluation but was found to have downtrending hgb and [facility] was asked to admit.  She tells me she has no overt bleeding. Denies any N/V/D, change in bowels. Denies blood in stool or dark/tarry stool. She has no wounds. Denies blood in her urine. Denies any abdominal pain.   Past Psychiatric History " Polysubstance abuse, Depression, Schizoaffective;Bipolar type,  Previous dx's of schizophrenia, bipolar- Multiple inpatient stays (5) [facility]; last inpatient admission [place] in 01/2022  after suicide attempt- Follows up at [facility], previously had ACT team - hx of suicide attempt x2 via overdose as a teen, 12/2021 by OD"    Risk to Self:  Denies, despite endorsing overdose on admission Risk to Others:   yes, threatened brother Prior Inpatient Therapy:   Recently 08/23 following serious suicide attempt of high lethality Prior Outpatient Therapy:  Denies  Past Medical History:  Past Medical History:  Diagnosis Date   Abdominal pain    Accidental drug overdose April 2013   Anxiety    Atrial fibrillation (HCC) 09/29/11   converted spontaneously   Chronic back pain    Chronic knee pain    Chronic nausea    Chronic pain    COPD (chronic obstructive pulmonary disease) (HCC)    Depression    Diabetes mellitus    states her doctor took her off all DM meds in past month   Diabetic neuropathy (HCC)    Dyspnea    with exertion    GERD (gastroesophageal reflux disease)    Headache(784.0)    migraines    HTN (hypertension)    not on meds since in a year    Hyperlipidemia    Hypothyroidism    not on meds in a while    Mental disorder    Bipolar and schizophrenic   Nausea and vomiting 01/02/2023   Requires supplemental oxygen    as needed per patient    Schizophrenia (HCC)    Schizophrenia, acute (HCC) 11/13/2017   Tobacco abuse     Past Surgical History:  Procedure Laterality Date   ABDOMINAL HYSTERECTOMY     BLADDER SUSPENSION  03/04/2011   Procedure: [facility] PROCEDURE;  Surgeon: Jonna Coup MacDiarmid;  Location: [facility] ORS;  Service: Urology;  Laterality: N/A;   BOWEL RESECTION N/A 04/18/2022   Procedure: SMALL BOWEL RESECTION;  Surgeon: Harriette Bouillon, MD;  Location: [facility] OR;  Service: General;  Laterality: N/A;   CYSTOCELE REPAIR  03/04/2011   Procedure: ANTERIOR REPAIR (CYSTOCELE);  Surgeon: Martina Sinner;  Location: [facility] ORS;  Service: Urology;  Laterality: N/A;   CYSTOSCOPY  03/04/2011   Procedure: CYSTOSCOPY;  Surgeon: Jonna Coup MacDiarmid;  Location: [facility];  Service: Urology;  Laterality: N/A;   ESOPHAGOGASTRODUODENOSCOPY (EGD) WITH PROPOFOL N/A 05/12/2017   Procedure: ESOPHAGOGASTRODUODENOSCOPY  (EGD) WITH PROPOFOL;  Surgeon: Ovidio Kin, MD;  Location: [facility];  Service: General;  Laterality: N/A;   GASTRIC ROUX-EN-Y N/A 03/25/2016   Procedure: LAPAROSCOPIC ROUX-EN-Y GASTRIC BYPASS WITH UPPER ENDOSCOPY;  Surgeon: Glenna Fellows, MD;  Location: [facility]S;  Service: General;  Laterality: N/A;   KNEE SURGERY     LAPAROSCOPIC ASSISTED VAGINAL HYSTERECTOMY  03/04/2011   Procedure: LAPAROSCOPIC ASSISTED VAGINAL HYSTERECTOMY;  Surgeon: Jeani Hawking, MD;  Location: [facility];  Service: Gynecology;  Laterality: N/A;   LAPAROTOMY N/A 04/18/2022   Procedure: EXPLORATORY LAPAROTOMY;  Surgeon: Harriette Bouillon, MD;  Location: [facility] OR;  Service: General;  Laterality: N/A;   LAPAROTOMY N/A 04/24/2022   Procedure: BRING BACK EXPLORATORY LAPAROTOMY;  Surgeon: Violeta Gelinas, MD;  Location: [facility] OR;  Service: General;  Laterality: N/A;   TOTAL HIP ARTHROPLASTY Right 08/27/2022   Procedure: TOTAL HIP ARTHROPLASTY;  Surgeon: Joen Laura, MD;  Location: [facility] OR;  Service: Orthopedics;  Laterality: Right;   Family History:  Family History  Problem Relation Age of Onset   Heart attack Father        52s   Diabetes Mother    Heart disease  Mother    Hypertension Mother    Heart attack Sister        66   COPD Other    Breast cancer Neg Hx    Family Psychiatric  History:  Social History:  Social History   Substance and Sexual Activity  Alcohol Use Yes   Comment: 40 ounces daily     Social History   Substance and Sexual Activity  Drug Use Yes   Types: Cocaine, Marijuana, "Crack" cocaine    Social History   Socioeconomic History   Marital status: Legally Separated    Spouse name: Not on file   Number of children: Not on file   Years of education: Not on file   Highest education level: Not on file  Occupational History   Not on file  Tobacco Use   Smoking status: Every Day    Current packs/day: 1.00    Types: Cigarettes   Smokeless tobacco: Never  Vaping Use   Vaping status:  Never Used  Substance and Sexual Activity   Alcohol use: Yes    Comment: 40 ounces daily   Drug use: Yes    Types: Cocaine, Marijuana, "Crack" cocaine   Sexual activity: Yes    Partners: Male  Other Topics Concern   Not on file  Social History Narrative   Not on file   Social Determinants of Health   Financial Resource Strain: Not on file  Food Insecurity: No Food Insecurity (02/22/2023)   Hunger Vital Sign    Worried About Running Out of Food in the Last Year: Never true    Ran Out of Food in the Last Year: Never true  Transportation Needs: No Transportation Needs (02/22/2023)   PRAPARE - Administrator, Civil Service (Medical): No    Lack of Transportation (Non-Medical): No  Physical Activity: Not on file  Stress: Not on file  Social Connections: Unknown (01/06/2023)   Received from [facility]   Social Network    Social Network: Not on file   Additional Social History:    Allergies:   Allergies  Allergen Reactions   Iron Dextran Shortness Of Breath and Anxiety   Aspirin Nausea And Vomiting and Other (See Comments)    Ok to take tylenol or ibuprofen    Penicillins Other (See Comments)    Unknown. Childhood reaction     Labs:  Results for orders placed or performed during the hospital encounter of 03/06/23 (from the past 48 hour(s))  Vitamin B12     Status: None   Collection Time: 03/12/23  9:30 AM  Result Value Ref Range   Vitamin B-12 212 180 - 914 pg/mL    Comment: (NOTE) This assay is not validated for testing neonatal or myeloproliferative syndrome specimens for Vitamin B12 levels. Performed at [facility], [address] [place], [place] [address]   Folate     Status: None   Collection Time: 03/12/23  9:30 AM  Result Value Ref Range   Folate 25.2 >5.9 ng/mL    Comment: Performed at [facility], [address] [place], [place] [address]  Iron and TIBC     Status: Abnormal   Collection Time: 03/12/23  9:30 AM  Result Value  Ref Range   Iron <5 (L) 28 - 170 ug/dL   TIBC 578 (H) 469 - 629 ug/dL   Saturation Ratios NOT CALCULATED 10.4 - 31.8 %   UIBC NOT CALCULATED ug/dL    Comment: Performed at  [facility], [address] [place] [place], [place] [address]  Ferritin     Status: Abnormal   Collection Time: 03/12/23  9:30 AM  Result Value Ref Range   Ferritin 7 (L) 11 - 307 ng/mL    Comment: Performed at [facility], [address], [place], [place] [address]  Reticulocytes     Status: Abnormal   Collection Time: 03/12/23  9:30 AM  Result Value Ref Range   Retic Ct Pct 2.0 0.4 - 3.1 %   RBC. 3.20 (L) 3.87 - 5.11 MIL/uL   Retic Count, Absolute 64.3 19.0 - 186.0 K/uL   Immature Retic Fract 20.3 (H) 2.3 - 15.9 %    Comment: Performed at [facility], [address] [place], [place] [address]  Comprehensive metabolic panel     Status: Abnormal   Collection Time: 03/12/23  9:30 AM  Result Value Ref Range   Sodium 131 (L) 135 - 145 mmol/L   Potassium 3.6 3.5 - 5.1 mmol/L   Chloride 96 (L) 98 - 111 mmol/L   CO2 25 22 - 32 mmol/L   Glucose, Bld 200 (H) 70 - 99 mg/dL    Comment: Glucose reference range applies only to samples taken after fasting for at least 8 hours.   BUN 7 6 - 20 mg/dL   Creatinine, Ser 9.14 0.44 - 1.00 mg/dL   Calcium 8.6 (L) 8.9 - 10.3 mg/dL   Total Protein 6.6 6.5 - 8.1 g/dL   Albumin 3.0 (L) 3.5 - 5.0 g/dL   AST 21 15 - 41 U/L   ALT 17 0 - 44 U/L   Alkaline Phosphatase 65 38 - 126 U/L   Total Bilirubin 0.4 0.3 - 1.2 mg/dL   GFR, Estimated >78 >29 mL/min    Comment: (NOTE) Calculated using the CKD-EPI Creatinine Equation (2021)    Anion gap 10 5 - 15    Comment: Performed at [facility], [address], [place], [place] [address]  CBC with Differential     Status: Abnormal   Collection Time: 03/12/23  9:30 AM  Result Value Ref Range   WBC 7.6 4.0 - 10.5 K/uL   RBC 3.22 (L) 3.87 - 5.11 MIL/uL   Hemoglobin 7.2 (L) 12.0 - 15.0 g/dL   HCT 08.6 (L) 57.8 - 46.9 %    MCV 79.8 (L) 80.0 - 100.0 fL   MCH 22.4 (L) 26.0 - 34.0 pg   MCHC 28.0 (L) 30.0 - 36.0 g/dL   RDW 62.9 (H) 52.8 - 41.3 %   Platelets 356 150 - 400 K/uL   nRBC 0.0 0.0 - 0.2 %   Neutrophils Relative % 66 %   Neutro Abs 5.0 1.7 - 7.7 K/uL   Lymphocytes Relative 25 %   Lymphs Abs 1.9 0.7 - 4.0 K/uL   Monocytes Relative 7 %   Monocytes Absolute 0.5 0.1 - 1.0 K/uL   Eosinophils Relative 1 %   Eosinophils Absolute 0.1 0.0 - 0.5 K/uL   Basophils Relative 1 %   Basophils Absolute 0.0 0.0 - 0.1 K/uL   Immature Granulocytes 0 %   Abs Immature Granulocytes 0.02 0.00 - 0.07 K/uL    Comment: Performed at [facility], [address], [place], [place] [address]  CBC     Status: Abnormal   Collection Time: 03/13/23  8:32 AM  Result Value Ref Range   WBC 7.1 4.0 - 10.5 K/uL   RBC 3.12 (L) 3.87 -  5.11 MIL/uL   Hemoglobin 7.0 (L) 12.0 - 15.0 g/dL   HCT 40.1 (L) 02.7 - 25.3 %   MCV 79.8 (L) 80.0 - 100.0 fL   MCH 22.4 (L) 26.0 - 34.0 pg   MCHC 28.1 (L) 30.0 - 36.0 g/dL   RDW 66.4 (H) 40.3 - 47.4 %   Platelets 305 150 - 400 K/uL   nRBC 0.0 0.0 - 0.2 %    Comment: Performed at [facility], [address] [place], [place] [address]  POC occult blood, ED     Status: None   Collection Time: 03/13/23 10:42 AM  Result Value Ref Range   Fecal Occult Bld NEGATIVE NEGATIVE  CBC     Status: Abnormal   Collection Time: 03/13/23 11:42 AM  Result Value Ref Range   WBC 7.3 4.0 - 10.5 K/uL   RBC 3.52 (L) 3.87 - 5.11 MIL/uL   Hemoglobin 8.3 (L) 12.0 - 15.0 g/dL   HCT 38.7 (L) 56.4 - 33.2 %   MCV 81.3 80.0 - 100.0 fL   MCH 23.6 (L) 26.0 - 34.0 pg   MCHC 29.0 (L) 30.0 - 36.0 g/dL   RDW 95.1 (H) 88.4 - 16.6 %   Platelets 244 150 - 400 K/uL   nRBC 0.0 0.0 - 0.2 %    Comment: Performed at [facility], [address] [place], [place] [address]  CBG monitoring, ED     Status: Abnormal   Collection Time: 03/13/23 12:17 PM  Result Value Ref Range   Glucose-Capillary 145 (H) 70 - 99  mg/dL    Comment: Glucose reference range applies only to samples taken after fasting for at least 8 hours.  Type and screen  [facility]     Status: None   Collection Time: 03/13/23 12:26 PM  Result Value Ref Range   ABO/RH(D) A POS    Antibody Screen POS    Sample Expiration      03/16/2023,2359 Performed at [facility], [address], [place], [place] [address]     Current Facility-Administered Medications  Medication Dose Route Frequency Provider Last Rate Last Admin   acetaminophen (TYLENOL) tablet 650 mg  650 mg Oral Q6H PRN Rancour, Stephen, MD   650 mg at 03/13/23 0801   albuterol (PROVENTIL) (2.5 MG/3ML) 0.083% nebulizer solution 2.5 mg  2.5 mg Nebulization Q6H PRN Gwyneth Sprout, MD   2.5 mg at 03/07/23 1801   albuterol (VENTOLIN HFA) 108 (90 Base) MCG/ACT inhaler 2 puff  2 puff Inhalation Q6H PRN Tegeler, Canary Brim, MD   2 puff at 03/13/23 0801   ARIPiprazole (ABILIFY) tablet 10 mg  10 mg Oral Daily Eligha Bridegroom, NP   10 mg at 03/13/23 3235   cyanocobalamin (VITAMIN B12) injection 1,000 mcg  1,000 mcg Intramuscular Daily Orland Mustard, MD       diphenhydrAMINE (BENADRYL) capsule 50 mg  50 mg Oral Q6H PRN Lenox Ponds, NP   50 mg at 03/13/23 0801   Or   diphenhydrAMINE (BENADRYL) injection 50 mg  50 mg Intramuscular Q6H PRN Lenox Ponds, NP   50 mg at 03/13/23 0131   famotidine (PEPCID) tablet 20 mg  20 mg Oral Daily Gwyneth Sprout, MD   20 mg at 03/13/23 5732   ferrous sulfate tablet 325 mg  325 mg Oral BID WC Pricilla Loveless, MD   325 mg at 03/13/23 0801   fluticasone furoate-vilanterol (BREO ELLIPTA) 200-25 MCG/ACT 1 puff  1  puff Inhalation Daily Gwyneth Sprout, MD   1 puff at 03/13/23 0802   folic acid (FOLVITE) tablet 1 mg  1 mg Oral Daily Ophelia Shoulder E, NP   1 mg at 03/13/23 8416   furosemide (LASIX) tablet 20 mg  20 mg Oral Daily Gwyneth Sprout, MD   20 mg at 03/13/23 6063   gabapentin (NEURONTIN) capsule 300 mg  300  mg Oral TID Ophelia Shoulder E, NP   300 mg at 03/13/23 0160   haloperidol (HALDOL) tablet 5 mg  5 mg Oral Q6H PRN Lenox Ponds, NP   5 mg at 03/13/23 1093   Or   haloperidol lactate (HALDOL) injection 5 mg  5 mg Intramuscular Q6H PRN Lenox Ponds, NP   5 mg at 03/13/23 0131   insulin aspart (novoLOG) injection 0-15 Units  0-15 Units Subcutaneous TID WC Orland Mustard, MD       LORazepam (ATIVAN) injection 2 mg  2 mg Intramuscular Once Lorre Nick, MD       LORazepam (ATIVAN) tablet 2 mg  2 mg Oral Q6H PRN Lenox Ponds, NP   2 mg at 03/12/23 0443   Or   LORazepam (ATIVAN) injection 2 mg  2 mg Intramuscular Q6H PRN Lenox Ponds, NP   2 mg at 03/13/23 1255   LORazepam (ATIVAN) tablet 1-4 mg  1-4 mg Oral Q1H PRN Melene Plan, DO   1 mg at 03/13/23 0935   nicotine (NICODERM CQ - dosed in mg/24 hours) patch 21 mg  21 mg Transdermal Daily Gloris [place], MD   21 mg at 03/13/23 0931   nicotine polacrilex (NICORETTE) gum 2 mg  2 mg Oral PRN Melene Plan, DO   2 mg at 03/11/23 0923   ondansetron (ZOFRAN) tablet 4 mg  4 mg Oral Q6H Ophelia Shoulder E, NP   4 mg at 03/13/23 1315   pantoprazole (PROTONIX) injection 40 mg  40 mg Intravenous Q24H Orland Mustard, MD   40 mg at 03/13/23 1315   sucralfate (CARAFATE) tablet 1 g  1 g Oral TID WC & HS Ophelia Shoulder E, NP   1 g at 03/13/23 0801   thiamine (VITAMIN B1) tablet 100 mg  100 mg Oral Daily Ophelia Shoulder E, NP   100 mg at 03/13/23 2355   Or   thiamine (VITAMIN B1) injection 100 mg  100 mg Intravenous Daily Chales Abrahams, NP       Current Outpatient Medications  Medication Sig Dispense Refill   acetaminophen (TYLENOL) 325 MG tablet Take 650 mg by mouth every 6 (six) hours as needed for mild pain.     albuterol (VENTOLIN HFA) 108 (90 Base) MCG/ACT inhaler Inhale 1-2 puffs into the lungs every 6 (six) hours as needed for wheezing or shortness of breath. 1 each 0   budesonide-formoterol (SYMBICORT) 160-4.5 MCG/ACT inhaler Inhale 2 puffs into the  lungs 2 (two) times daily. 10.2 g 2   diclofenac Sodium (VOLTAREN) 1 % GEL Apply 2 g topically 4 (four) times daily. (Patient not taking: Reported on 03/07/2023)     famotidine (PEPCID) 20 MG tablet Take 1 tablet (20 mg total) by mouth daily. (Patient not taking: Reported on 03/07/2023) 30 tablet 0   furosemide (LASIX) 20 MG tablet Take 1 tablet (20 mg total) by mouth daily. (Patient not taking: Reported on 03/07/2023) 2 tablet 0   gabapentin (NEURONTIN) 100 MG capsule Take 100 mg by mouth 3 (three) times daily. (Patient not taking: Reported on 03/07/2023)  hyoscyamine (LEVSIN/SL) 0.125 MG SL tablet Place 1 tablet (0.125 mg total) under the tongue every 4 (four) hours as needed (pain). Up to 1.25 mg daily (Patient not taking: Reported on 02/22/2023) 20 tablet 0   metFORMIN (GLUCOPHAGE-XR) 750 MG 24 hr tablet Take 1 tablet (750 mg total) by mouth daily. (Patient not taking: Reported on 03/07/2023) 30 tablet 0   ondansetron (ZOFRAN) 4 MG tablet Take 1 tablet (4 mg total) by mouth every 6 (six) hours. (Patient not taking: Reported on 02/22/2023) 12 tablet 0   ondansetron (ZOFRAN-ODT) 4 MG disintegrating tablet Take 1 tablet (4 mg total) by mouth every 8 (eight) hours as needed for nausea or vomiting. (Patient not taking: Reported on 03/07/2023) 8 tablet 0   OXYGEN Inhale 4-5 L into the lungs as needed (COPD).     sucralfate (CARAFATE) 1 g tablet Take 1 tablet (1 g total) by mouth 4 (four) times daily -  with meals and at bedtime. (Patient not taking: Reported on 02/22/2023) 90 tablet 0   tiZANidine (ZANAFLEX) 2 MG tablet Take 2 mg by mouth at bedtime. (Patient not taking: Reported on 03/07/2023)     zonisamide (ZONEGRAN) 100 MG capsule Take 2 capsules (200 mg total) by mouth daily. (Patient not taking: Reported on 02/22/2023) 30 capsule 3    Musculoskeletal:  Psychiatric Specialty Exam:Unable to assess due to sedation  Presentation  General Appearance:  Appropriate for Environment; Casual  Eye  Contact: Good  Speech: SLurred  Physical Exam: Physical Exam Vitals and nursing note reviewed.  Constitutional:      Appearance: Normal appearance. She is normal weight.     Interventions: She is sedated and restrained.     Comments: Posey belt, received ativan   Pulmonary:     Effort: Tachypnea present.     Comments: Coughing after eating and drinking.  Skin:    General: Skin is warm and dry.     Capillary Refill: Capillary refill takes less than 2 seconds.  Neurological:     General: No focal deficit present.     Mental Status: She is alert. Mental status is at baseline. She is disoriented.  Psychiatric:        Attention and Perception: She is inattentive. She perceives visual hallucinations.        Mood and Affect: Affect normal. Mood is anxious.        Speech: Speech is slurred.        Behavior: Behavior is slowed. Behavior is cooperative.        Thought Content: Thought content is paranoid. Thought content is not delusional. Thought content does not include homicidal or suicidal ideation. Thought content does not include homicidal or suicidal plan.        Cognition and Memory: Cognition is impaired. Memory is impaired.        Judgment: Judgment normal.      Review of Systems  Psychiatric/Behavioral:  Positive for hallucinations (visual improving) and substance abuse (etoh).   All other systems reviewed and are negative.  Blood pressure 127/66, pulse 94, temperature 98.4 F (36.9 C), temperature source Oral, resp. rate 17, height 5\' 2"  (1.575 m), weight 56 kg, last menstrual period 01/08/2011, SpO2 98%. Body mass index is 22.58 kg/m.  Treatment Plan Summary: Daily contact with patient to assess and evaluate symptoms and progress in treatment, Medication management, and Plan    Continue Abilify 10mg  po qhs.  -Continue agitation protocol Continue delirium precautions.  Continue Recruitment consultant for AMS/Psychosis once admitted to  the floor. High risk to elope Will  reorder IV thiamine once admitted to the floor. B1 level has been obtained.   -IVC renewed, currently expires on September 27.  She is currently under IVC for medical purposes, as she lacks capacity and was attempting to leave before being medically stable.   Continue CIWA and Ativan protocol at this time, for 2 additional days.   Labs reviewed and assessed. Patient with multiple abnormalities --> defer to primary team. Hgb (7.0) , TIBC (552), UDS (+ BZD),   EKG  03/06/23 -QTc 433  Disposition: Psychiatric consult service will continue to follow, patient has achieved psychiatric stabilization. She continues to have some intermittent hallucinations and delusions that are improving. Patients current presentation and daily evaluations, does not warrant inpatient psychiatric hospitalization.  It is felt patient is nearing her baseline and current anemia, alcohol use, hospital admission are likely contributing to prolonged delirium.  Once patient is medically stable, may rescind IVC. Final disposition is pending.    Maryagnes Amos, FNP 03/13/2023 2:19 PM

## 2023-03-13 NOTE — Assessment & Plan Note (Signed)
Psychiatry consulted

## 2023-03-13 NOTE — ED Notes (Signed)
Pt refusing to go back to room and is being sexually inappropriate towards staff by removing clothing and refusing to keep clothes on. Pt remains agitated and is continuing to threaten staff and leave ED. MD wolfe made aware. Med orders placed at this time as pt remains under IVC and is not able to leave.

## 2023-03-13 NOTE — Assessment & Plan Note (Signed)
B12 of 212 in setting of chronic alcohol use Start IM replacement while inpatient

## 2023-03-13 NOTE — ED Notes (Signed)
MD at bedside discussing lab results with patient.

## 2023-03-13 NOTE — Assessment & Plan Note (Signed)
Sodium 131, likely due to beer potomania Stable, continue to monitor If declines, check urine studies.

## 2023-03-13 NOTE — Assessment & Plan Note (Signed)
No s/sx of exacerbation. Clear on exam  Continue maintenance breo and SABA prn

## 2023-03-13 NOTE — Assessment & Plan Note (Addendum)
On pepcid 20mg  daily  Change to protonix with alcohol hx and acute on chronic anemia  Continue carafate

## 2023-03-13 NOTE — ED Notes (Signed)
I spoke with the nurse to confirm if the patient would be a continued IVC'D. The nurse will confirm with the doctor and will send a secure chat message as to how to move forward with the patient's care.

## 2023-03-13 NOTE — ED Notes (Signed)
Pt sitting by desk eating lunch. Pt is calm, cooperative and redirectable at this time.

## 2023-03-13 NOTE — Assessment & Plan Note (Addendum)
57 year old female presenting to ED for psychiatric eval found to have acute on chronic anemia with hgb to 7.0. No acute bleeding and denies any sources of bleeding.  -obs to progressive -hgb was 10.2 about 2 weeks ago -baseline around 8-10.  -hx of ACD, on iron but unsure  how compliant she is with this  -B12 low, repleting -iron studies done with ACD -check UA  -type and screen -fecal occult negative  -with alcohol use concern for more gastritis/etc.  -GI consulted -cbc q 6 hours -transfuse to keep hgb >7.0

## 2023-03-13 NOTE — Assessment & Plan Note (Addendum)
Drinks 2.5 40 ounce beers/day On CIWA protocol  Place in progressive

## 2023-03-13 NOTE — ED Notes (Signed)
The nurse and the doctor was notified of the IVC documents expire today.

## 2023-03-13 NOTE — Assessment & Plan Note (Signed)
IVC in place, psychiatry will place again if deem necessary. Expires today Continue medication per psychiatry

## 2023-03-13 NOTE — ED Notes (Signed)
Pt became increasingly agitated due to change in diet. Pt stated that she wants her clothes and wants to leave at this time if she is not able to eat food. Pt refusing all care from RN. Pt is pacing in front of nursing desk visibly agitated at this time. Pt is still under IVC and is not able to leave on own, pt was made aware but states she still refuses all care and wishes to leave and receive her belongings. MD Artis Flock made aware. Awaiting orders and for MD and psych to speak with pt.

## 2023-03-13 NOTE — ED Notes (Addendum)
Patient came out the room demanding staff to give her food; pt was reminded of snack hours and that snack bag are gone for the night; Pt still has snacks in her room; Patient started yelling and disrupting other patients and pulled the code button; RN sat in room in front of code box to prevent patient from continuing to pull the it;Pt threaten RN that she would sit on this RN if remained in "her" room. [employer] and sitter at bedside for assistance; Security called for support with med administration-Monique,RN

## 2023-03-13 NOTE — ED Notes (Signed)
MD Ray, made aware of repeat hgb of 7.0, awaiting for any new orders or plan of care at this time.

## 2023-03-13 NOTE — Assessment & Plan Note (Addendum)
Check UDS  Nicotine patch and is quite agitated she can not smoke

## 2023-03-14 DIAGNOSIS — E871 Hypo-osmolality and hyponatremia: Secondary | ICD-10-CM | POA: Diagnosis not present

## 2023-03-14 DIAGNOSIS — E119 Type 2 diabetes mellitus without complications: Secondary | ICD-10-CM | POA: Diagnosis not present

## 2023-03-14 DIAGNOSIS — E039 Hypothyroidism, unspecified: Secondary | ICD-10-CM | POA: Diagnosis not present

## 2023-03-14 DIAGNOSIS — J449 Chronic obstructive pulmonary disease, unspecified: Secondary | ICD-10-CM | POA: Diagnosis not present

## 2023-03-14 DIAGNOSIS — F250 Schizoaffective disorder, bipolar type: Secondary | ICD-10-CM | POA: Diagnosis not present

## 2023-03-14 DIAGNOSIS — D62 Acute posthemorrhagic anemia: Secondary | ICD-10-CM | POA: Diagnosis not present

## 2023-03-14 DIAGNOSIS — I10 Essential (primary) hypertension: Secondary | ICD-10-CM | POA: Diagnosis not present

## 2023-03-14 DIAGNOSIS — E538 Deficiency of other specified B group vitamins: Secondary | ICD-10-CM | POA: Diagnosis not present

## 2023-03-14 DIAGNOSIS — Z79899 Other long term (current) drug therapy: Secondary | ICD-10-CM | POA: Diagnosis not present

## 2023-03-14 DIAGNOSIS — Z7984 Long term (current) use of oral hypoglycemic drugs: Secondary | ICD-10-CM | POA: Diagnosis not present

## 2023-03-14 DIAGNOSIS — F17210 Nicotine dependence, cigarettes, uncomplicated: Secondary | ICD-10-CM | POA: Diagnosis not present

## 2023-03-14 DIAGNOSIS — Z96641 Presence of right artificial hip joint: Secondary | ICD-10-CM | POA: Diagnosis not present

## 2023-03-14 DIAGNOSIS — I4891 Unspecified atrial fibrillation: Secondary | ICD-10-CM | POA: Diagnosis not present

## 2023-03-14 LAB — TYPE AND SCREEN
ABO/RH(D): A POS
Antibody Screen: POSITIVE
Donor AG Type: NEGATIVE
Donor AG Type: NEGATIVE
Unit division: 0
Unit division: 0

## 2023-03-14 LAB — CBC
HCT: 22 % — ABNORMAL LOW (ref 36.0–46.0)
HCT: 22.5 % — ABNORMAL LOW (ref 36.0–46.0)
HCT: 29 % — ABNORMAL LOW (ref 36.0–46.0)
Hemoglobin: 6.3 g/dL — CL (ref 12.0–15.0)
Hemoglobin: 6.5 g/dL — CL (ref 12.0–15.0)
Hemoglobin: 8.7 g/dL — ABNORMAL LOW (ref 12.0–15.0)
MCH: 22.3 pg — ABNORMAL LOW (ref 26.0–34.0)
MCH: 23 pg — ABNORMAL LOW (ref 26.0–34.0)
MCH: 23.9 pg — ABNORMAL LOW (ref 26.0–34.0)
MCHC: 28.6 g/dL — ABNORMAL LOW (ref 30.0–36.0)
MCHC: 28.9 g/dL — ABNORMAL LOW (ref 30.0–36.0)
MCHC: 30 g/dL (ref 30.0–36.0)
MCV: 78 fL — ABNORMAL LOW (ref 80.0–100.0)
MCV: 79.8 fL — ABNORMAL LOW (ref 80.0–100.0)
MCV: 79.7 fL — ABNORMAL LOW (ref 80.0–100.0)
Platelets: 314 10*3/uL (ref 150–400)
Platelets: 304 10*3/uL (ref 150–400)
Platelets: 344 10*3/uL (ref 150–400)
RBC: 2.82 MIL/uL — ABNORMAL LOW (ref 3.87–5.11)
RBC: 2.82 MIL/uL — ABNORMAL LOW (ref 3.87–5.11)
RBC: 3.64 MIL/uL — ABNORMAL LOW (ref 3.87–5.11)
RDW: 19.9 % — ABNORMAL HIGH (ref 11.5–15.5)
RDW: 19.9 % — ABNORMAL HIGH (ref 11.5–15.5)
RDW: 20.7 % — ABNORMAL HIGH (ref 11.5–15.5)
WBC: 7 10*3/uL (ref 4.0–10.5)
WBC: 5.7 10*3/uL (ref 4.0–10.5)
WBC: 7.4 10*3/uL (ref 4.0–10.5)
nRBC: 0 % (ref 0.0–0.2)
nRBC: 0 % (ref 0.0–0.2)
nRBC: 0 % (ref 0.0–0.2)

## 2023-03-14 LAB — BASIC METABOLIC PANEL
Anion gap: 9 (ref 5–15)
BUN: 10 mg/dL (ref 6–20)
CO2: 24 mmol/L (ref 22–32)
Calcium: 8 mg/dL — ABNORMAL LOW (ref 8.9–10.3)
Chloride: 104 mmol/L (ref 98–111)
Creatinine, Ser: 0.48 mg/dL (ref 0.44–1.00)
GFR, Estimated: >60 mL/min (ref >60)
Glucose, Bld: 149 mg/dL — ABNORMAL HIGH (ref 70–99)
Potassium: 3.9 mmol/L (ref 3.5–5.1)
Sodium: 137 mmol/L (ref 135–145)

## 2023-03-14 LAB — GLUCOSE, CAPILLARY
Glucose-Capillary: 117 mg/dL — ABNORMAL HIGH (ref 70–99)
Glucose-Capillary: 170 mg/dL — ABNORMAL HIGH (ref 70–99)
Glucose-Capillary: 148 mg/dL — ABNORMAL HIGH (ref 70–99)
Glucose-Capillary: 121 mg/dL — ABNORMAL HIGH (ref 70–99)

## 2023-03-14 LAB — BPAM RBC
Blood Product Expiration Date: 202410022359
Blood Product Expiration Date: 202410162359
Unit Type and Rh: 6200
Unit Type and Rh: 6200

## 2023-03-14 LAB — PREPARE RBC (CROSSMATCH)

## 2023-03-14 MED ORDER — SODIUM CHLORIDE 0.9% IV SOLUTION: Freq: Once | INTRAVENOUS | Status: AC

## 2023-03-14 MED ORDER — FERROUS SULFATE 325 (65 FE) MG PO TABS
325.0000 mg | ORAL_TABLET | ORAL | Status: DC
  Administered 2023-03-16: 325 mg via ORAL
  Filled 2023-03-14: qty 1

## 2023-03-14 NOTE — Plan of Care (Signed)

## 2023-03-14 NOTE — Progress Notes (Signed)
PROGRESS NOTE    [name]  BJY:[id number] DOB: May 20, 1966 DOA: 03/06/2023 PCP: Fleet Contras, MD  Chief Complaint  Patient presents with   Psychiatric Evaluation    Pt presents to the ED accompanied by [facility] as an IVC. Pt handcuffed, cursing, yelling, and aggressive upon arrival to ED. Per IVC paperwork, daughter filed an IVC due to "pt took [name] lot of pills in an attempt to kill herself. Accusing people of stealing her drugs, pt aggressive and is threatening to injure another's property and cussing at people." Pt uncooperative during triage and not answering questions.     Brief Narrative:   [name] is [name] 57 y.o. female with medical history significant of  schizophrenia, COPD, DM type II and chronic alcohol use presented to ED for psychiatric evaluation but was found to have downtrending hgb and [facility] was asked to admit.  She tells me she has no overt bleeding. Denies any N/V/D, change in bowels. Denies blood in stool or dark/tarry stool. She has no wounds. Denies blood in her urine. Denies any abdominal pain.      She smokes 2 PPD/drinks 2 1/2 40 ounces/beer daily.    ER Course:  vitals: afebrile, bp: 127/66, HR; 94, RR: 17, oxygen: 98%RA Pertinent labs: hgb 7.2 (10.2 two weeks ago) sodium: 131, glucose: 200, B12: 212, iron <5, TIBC 552, ferritin 7,  In ED: started on CIWA protocol. Psychiatry consulted. [facility] asked to admit for drop in hgb.   Assessment & Plan:   Principal Problem:   Schizoaffective disorder, bipolar type (HCC) Active Problems:   Acute on chronic anemia   Alcohol use disorder, severe, dependence (HCC)   B12 deficiency   COPD (chronic obstructive pulmonary disease) (HCC)   Essential hypertension   Hyponatremia   Polysubstance abuse (HCC)   Substance induced mood disorder (HCC)   Type 2 diabetes mellitus (HCC)   GERD (gastroesophageal reflux disease)   Hypothyroidism  Acute on chronic anemia Iron Deficiency Anemia B12 Deficiency 57 year old  female presenting to ED for psychiatric eval found to have acute on chronic anemia with hgb to 7.0. No acute bleeding and denies any sources of bleeding.  -hgb was 10.1 about 2 weeks ago -baseline around 8-10.  -labs c/w iron def anemia, b12 deficiency -  normal folate -check UA (bland) -now s/p 1 unit pRBC  -GI consulted -> recommending outpatient workup of anemia, iron and b12 replacement  -transfuse to keep hgb >7.0    Alcohol use disorder, severe, dependence (HCC) Drinks 2.5 40 ounce beers/day On CIWA protocol - she's likely outside window for withdrawal as she's been observed in ED Place in progressive   B12 deficiency B12 of 212 in setting of chronic alcohol use Start IM replacement while inpatient    COPD (chronic obstructive pulmonary disease) (HCC) No s/sx of exacerbation. Clear on exam  Continue maintenance breo and SABA prn    Essential hypertension Well controlled. Continue lasix 20mg  daily   Hyponatremia improved   Polysubstance abuse (HCC) Check UDS  Nicotine patch and is quite agitated she can not smoke    Schizoaffective disorder, bipolar type (HCC) IVC in place, expires on 9/27  Continue medication per psychiatry    Substance induced mood disorder [facility]) Psychiatry consulted    Type 2 diabetes mellitus (HCC) Recent A1C of 7.4 Hold metformin SSI and acccuhecks QAC/HS    GERD (gastroesophageal reflux disease) On pepcid 20mg  daily  Change to protonix with alcohol hx and acute on chronic  anemia  Continue carafate    Hypothyroidism TSH wnl 02/2023 On no medication  Check TPO Ab outpatient     DVT prophylaxis: SCD Code Status: full Family Communication: none Disposition:   Status is: Observation The patient remains OBS appropriate and will d/c before 2 midnights.   Consultants:  GI Psychiatry  Procedures:  none  Antimicrobials:  Anti-infectives (From admission, onward)    None       Subjective: No  complaints  Objective: Vitals:   03/14/23 0730 03/14/23 0843 03/14/23 0905 03/14/23 1108  BP:  133/70 124/60 125/66  Pulse:  (!) 116 (!) 108   Resp:  18 16 18   Temp:  98.1 F (36.7 C) 98.5 F (36.9 C) 98.3 F (36.8 C)  TempSrc:  Oral Oral Oral  SpO2: 97% 96% 96% 96%  Weight:      Height:        Intake/Output Summary (Last 24 hours) at 03/14/2023 1332 Last data filed at 03/14/2023 1248 Gross per 24 hour  Intake 3030 ml  Output --  Net 3030 ml   Filed Weights   03/06/23 2132  Weight: 56 kg    Examination:  General exam: Appears calm and comfortable  Respiratory system: unlabored Cardiovascular system: RRR Central nervous system: Alert, moving all extremities Extremities: no LEE   Data Reviewed: I have personally reviewed following labs and imaging studies  CBC: Recent Labs  Lab 03/12/23 0930 03/13/23 0832 03/13/23 1142 03/13/23 2142 03/13/23 2319 03/14/23 0230  WBC 7.6 7.1 7.3 7.0  --  5.7  NEUTROABS 5.0  --   --   --   --   --   HGB 7.2* 7.0* 8.3* 6.3* 6.2* 6.5*  HCT 25.7* 24.9* 28.6* 22.0* 21.6* 22.5*  MCV 79.8* 79.8* 81.3 78.0*  --  79.8*  PLT 356 305 244 314  --  304    Basic Metabolic Panel: Recent Labs  Lab 03/12/23 0930 03/14/23 0230  NA 131* 137  K 3.6 3.9  CL 96* 104  CO2 25 24  GLUCOSE 200* 149*  BUN 7 10  CREATININE 0.50 0.48  CALCIUM 8.6* 8.0*    GFR: Estimated Creatinine Clearance: 60.6 mL/min (by C-G formula based on SCr of 0.48 mg/dL).  Liver Function Tests: Recent Labs  Lab 03/12/23 0930  AST 21  ALT 17  ALKPHOS 65  BILITOT 0.4  PROT 6.6  ALBUMIN 3.0*    CBG: Recent Labs  Lab 03/13/23 1217 03/13/23 1720 03/13/23 2104 03/14/23 0654 03/14/23 1113  GLUCAP 145* 135* 255* 117* 170*     No results found for this or any previous visit (from the past 240 hour(s)).       Radiology Studies: No results found.      Scheduled Meds:  ARIPiprazole  10 mg Oral Daily   cyanocobalamin  1,000 mcg Intramuscular  Daily   [START ON 03/16/2023] ferrous sulfate  325 mg Oral QODAY   fluticasone furoate-vilanterol  1 puff Inhalation Daily   folic acid  1 mg Oral Daily   furosemide  20 mg Oral Daily   gabapentin  300 mg Oral TID   insulin aspart  0-15 Units Subcutaneous TID WC   LORazepam  2 mg Intramuscular Once   nicotine  21 mg Transdermal Daily   ondansetron  4 mg Oral Q6H   pantoprazole (PROTONIX) IV  40 mg Intravenous Q24H   sucralfate  1 g Oral TID WC & HS   thiamine  100 mg Oral Daily   Or  thiamine  100 mg Intravenous Daily   Continuous Infusions:   LOS: 0 days    Time spent: over 30 min    Lacretia Nicks, MD [facility]   To contact the attending provider between 7A-7P or the covering provider during after hours 7P-7A, please log into the web site [email or web address] and access using universal [place] password for that web site. If you do not have the password, please call the hospital operator.  03/14/2023, 1:32 PM

## 2023-03-14 NOTE — Progress Notes (Signed)
1 unit PRBCs ordered to be transfused for hemoglobin of 6.5.    Presented at bedside, the patient endorses dyspnea with ambulation.  The writer obtained consent for blood transfusion from the patient while in the room, NT was also present at bedside.  Will repeat CBC post blood transfusion per protocol.  Time: 15 minutes.

## 2023-03-14 NOTE — Consult Note (Signed)
[facility] Face-to-Face Psychiatry Consult   Reason for Consult:   Aggressive Behavior  Referring Physician:  Dr. Artis Flock Patient Identification: [name] MRN:  [id number] Principal Diagnosis: Schizoaffective disorder, bipolar type (HCC) Diagnosis:  Principal Problem:   Schizoaffective disorder, bipolar type (HCC) Active Problems:   COPD (chronic obstructive pulmonary disease) (HCC)   Essential hypertension   Type 2 diabetes mellitus (HCC)   Polysubstance abuse (HCC)   Substance induced mood disorder (HCC)   GERD (gastroesophageal reflux disease)   Hypothyroidism   Alcohol use disorder, severe, dependence (HCC)   Hyponatremia   Acute on chronic anemia   B12 deficiency   Total Time spent with patient: 1 hour  Subjective: ''I am feeling good today, can I go home?''    Objective: 57 y/o female well known to psychiatric consult service due to multiple ER visits. Patient admitted to the hospital due to difficulty staying on focus, limited insight into psychiatric issues, dysphoric mood, and visual hallucinations-reportedly seeing bugs. Today, patient is alert, awake, fidgety but cooperative with decreased agitation and disruptive behavior. In addition, patient denies delusions, and homicidal thoughts. She has a history of elopement and leaving hospital against medical advice (AMA). In addition, she has a low hemoglobin level for which she requires a blood transfusion. As a result, and based on her history of recurrent AMA, the decision was made to continue the involuntary commitment (IVC).  '' HPI: per admission assessment note:  [name] is a 57 y.o. female with medical history significant of  schizophrenia, COPD, DM type II and chronic alcohol use presented to ED for psychiatric evaluation but was found to have downtrending hgb and TRH was asked to admit.  She tells me she has no overt bleeding. Denies any N/V/D, change in bowels. Denies blood in stool or dark/tarry stool. She has no  wounds. Denies blood in her urine. Denies any abdominal pain. ''  Past Psychiatric History " Polysubstance abuse, Depression, Schizoaffective;Bipolar type,  Previous dx's of schizophrenia, bipolar- Multiple inpatient stays (5) [facility], [facility], [place]; last inpatient admission [facility] in 01/2022 after suicide attempt- Follows up at [facility], previously had ACT team - hx of suicide attempt x2 via overdose as a teen, 12/2021 by OD"   Risk to Self:  Denies, despite endorsing overdose on admission Risk to Others:   yes, threatened brother Prior Inpatient Therapy:   Recently 08/23 following serious suicide attempt of high lethality Prior Outpatient Therapy:  Denies  Past Medical History:  Past Medical History:  Diagnosis Date   Abdominal pain    Accidental drug overdose April 2013   Anxiety    Atrial fibrillation (HCC) 09/29/11   converted spontaneously   Chronic back pain    Chronic knee pain    Chronic nausea    Chronic pain    COPD (chronic obstructive pulmonary disease) (HCC)    Depression    Diabetes mellitus    states her doctor took her off all DM meds in past month   Diabetic neuropathy (HCC)    Dyspnea    with exertion    GERD (gastroesophageal reflux disease)    Headache(784.0)    migraines    HTN (hypertension)    not on meds since in a year    Hyperlipidemia    Hypothyroidism    not on meds in a while    Mental disorder    Bipolar and schizophrenic   Nausea and vomiting 01/02/2023   Requires supplemental oxygen  as needed per patient    Schizophrenia (HCC)    Schizophrenia, acute (HCC) 11/13/2017   Tobacco abuse     Past Surgical History:  Procedure Laterality Date   ABDOMINAL HYSTERECTOMY     BLADDER SUSPENSION  03/04/2011   Procedure: [facility] PROCEDURE;  Surgeon: Jonna Coup MacDiarmid;  Location: [facility] ORS;  Service: Urology;  Laterality: N/A;   BOWEL RESECTION N/A 04/18/2022   Procedure: SMALL BOWEL RESECTION;  Surgeon: Harriette Bouillon, MD;  Location: [facility]  OR;  Service: General;  Laterality: N/A;   CYSTOCELE REPAIR  03/04/2011   Procedure: ANTERIOR REPAIR (CYSTOCELE);  Surgeon: Martina Sinner;  Location: [facility]S;  Service: Urology;  Laterality: N/A;   CYSTOSCOPY  03/04/2011   Procedure: CYSTOSCOPY;  Surgeon: Jonna Coup MacDiarmid;  Location: [facility];  Service: Urology;  Laterality: N/A;   ESOPHAGOGASTRODUODENOSCOPY (EGD) WITH PROPOFOL N/A 05/12/2017   Procedure: ESOPHAGOGASTRODUODENOSCOPY (EGD) WITH PROPOFOL;  Surgeon: Ovidio Kin, MD;  Location: [facility];  Service: General;  Laterality: N/A;   GASTRIC ROUX-EN-Y N/A 03/25/2016   Procedure: LAPAROSCOPIC ROUX-EN-Y GASTRIC BYPASS WITH UPPER ENDOSCOPY;  Surgeon: Glenna Fellows, MD;  Location: [facility] ORS;  Service: General;  Laterality: N/A;   KNEE SURGERY     LAPAROSCOPIC ASSISTED VAGINAL HYSTERECTOMY  03/04/2011   Procedure: LAPAROSCOPIC ASSISTED VAGINAL HYSTERECTOMY;  Surgeon: Jeani Hawking, MD;  Location: [facility];  Service: Gynecology;  Laterality: N/A;   LAPAROTOMY N/A 04/18/2022   Procedure: EXPLORATORY LAPAROTOMY;  Surgeon: Harriette Bouillon, MD;  Location: [facility] OR;  Service: General;  Laterality: N/A;   LAPAROTOMY N/A 04/24/2022   Procedure: BRING BACK EXPLORATORY LAPAROTOMY;  Surgeon: Violeta Gelinas, MD;  Location: [facility] OR;  Service: General;  Laterality: N/A;   TOTAL HIP ARTHROPLASTY Right 08/27/2022   Procedure: TOTAL HIP ARTHROPLASTY;  Surgeon: Joen Laura, MD;  Location: [facility] OR;  Service: Orthopedics;  Laterality: Right;   Family History:  Family History  Problem Relation Age of Onset   Heart attack Father        76s   Diabetes Mother    Heart disease Mother    Hypertension Mother    Heart attack Sister        25   COPD Other    Breast cancer Neg Hx    Family Psychiatric  History:  Social History:  Social History   Substance and Sexual Activity  Alcohol Use Yes   Comment: 40 ounces daily     Social History   Substance and Sexual Activity  Drug Use Yes   Types:  Cocaine, Marijuana, "Crack" cocaine    Social History   Socioeconomic History   Marital status: Legally Separated    Spouse name: Not on file   Number of children: Not on file   Years of education: Not on file   Highest education level: Not on file  Occupational History   Not on file  Tobacco Use   Smoking status: Every Day    Current packs/day: 1.00    Types: Cigarettes   Smokeless tobacco: Never  Vaping Use   Vaping status: Never Used  Substance and Sexual Activity   Alcohol use: Yes    Comment: 40 ounces daily   Drug use: Yes    Types: Cocaine, Marijuana, "Crack" cocaine   Sexual activity: Yes    Partners: Male  Other Topics Concern   Not on file  Social History Narrative   Not on file   Social Determinants of Health   Financial Resource Strain: Not on  file  Food Insecurity: No Food Insecurity (03/13/2023)   Hunger Vital Sign    Worried About Running Out of Food in the Last Year: Never true    Ran Out of Food in the Last Year: Never true  Transportation Needs: No Transportation Needs (03/13/2023)   PRAPARE - Administrator, Civil Service (Medical): No    Lack of Transportation (Non-Medical): No  Physical Activity: Not on file  Stress: Not on file  Social Connections: Unknown (01/06/2023)   Received from [facility]   Social Network    Social Network: Not on file   Additional Social History:    Allergies:   Allergies  Allergen Reactions   Iron Dextran Shortness Of Breath and Anxiety   Aspirin Nausea And Vomiting and Other (See Comments)    Ok to take tylenol or ibuprofen    Penicillins Other (See Comments)    Unknown. Childhood reaction     Labs:  Results for orders placed or performed during the hospital encounter of 03/06/23 (from the past 48 hour(s))  CBC     Status: Abnormal   Collection Time: 03/13/23  8:32 AM  Result Value Ref Range   WBC 7.1 4.0 - 10.5 K/uL   RBC 3.12 (L) 3.87 - 5.11 MIL/uL   Hemoglobin 7.0 (L) 12.0 - 15.0 g/dL    HCT 45.4 (L) 09.8 - 46.0 %   MCV 79.8 (L) 80.0 - 100.0 fL   MCH 22.4 (L) 26.0 - 34.0 pg   MCHC 28.1 (L) 30.0 - 36.0 g/dL   RDW 11.9 (H) 14.7 - 82.9 %   Platelets 305 150 - 400 K/uL   nRBC 0.0 0.0 - 0.2 %    Comment: Performed at [facility], [address] [place], [place] [address]  POC occult blood, ED     Status: None   Collection Time: 03/13/23 10:42 AM  Result Value Ref Range   Fecal Occult Bld NEGATIVE NEGATIVE  CBC     Status: Abnormal   Collection Time: 03/13/23 11:42 AM  Result Value Ref Range   WBC 7.3 4.0 - 10.5 K/uL   RBC 3.52 (L) 3.87 - 5.11 MIL/uL   Hemoglobin 8.3 (L) 12.0 - 15.0 g/dL   HCT 08.6 (L) 57.8 - 46.9 %   MCV 81.3 80.0 - 100.0 fL   MCH 23.6 (L) 26.0 - 34.0 pg   MCHC 29.0 (L) 30.0 - 36.0 g/dL   RDW 62.9 (H) 52.8 - 41.3 %   Platelets 244 150 - 400 K/uL   nRBC 0.0 0.0 - 0.2 %    Comment: Performed at [facility], [address], [place], [place] [address]  CBG monitoring, ED     Status: Abnormal   Collection Time: 03/13/23 12:17 PM  Result Value Ref Range   Glucose-Capillary 145 (H) 70 - 99 mg/dL    Comment: Glucose reference range applies only to samples taken after fasting for at least 8 hours.  Type and screen [facility]     Status: None   Collection Time: 03/13/23 12:26 PM  Result Value Ref Range   ABO/RH(D) A POS    Antibody Screen POS    Sample Expiration 03/14/2023,2359    Antibody Identification      ANTI M Performed at [facility], [address], [place], [place] [address]    Unit Number [id number]    Blood Component Type RED CELLS,LR    Unit division 00  Status of Unit REL FROM [facility]    Donor AG Type NEGATIVE FOR M ANTIGEN    Transfusion Status OK TO TRANSFUSE    Crossmatch Result COMPATIBLE    Unit Number [id number]    Blood Component Type RED CELLS,LR    Unit division 00    Status of Unit REL FROM [facility]    Donor AG Type NEGATIVE FOR M ANTIGEN    Transfusion Status OK TO TRANSFUSE     Crossmatch Result COMPATIBLE   Rapid urine drug screen (hospital performed)     Status: Abnormal   Collection Time: 03/13/23  2:20 PM  Result Value Ref Range   Opiates NONE DETECTED NONE DETECTED   Cocaine NONE DETECTED NONE DETECTED   Benzodiazepines POSITIVE (A) NONE DETECTED   Amphetamines NONE DETECTED NONE DETECTED   Tetrahydrocannabinol NONE DETECTED NONE DETECTED   Barbiturates NONE DETECTED NONE DETECTED    Comment: (NOTE) DRUG SCREEN FOR MEDICAL PURPOSES ONLY.  IF CONFIRMATION IS NEEDED FOR ANY PURPOSE, NOTIFY LAB WITHIN 5 DAYS.  LOWEST DETECTABLE LIMITS FOR URINE DRUG SCREEN Drug Class                     Cutoff (ng/mL) Amphetamine and metabolites    1000 Barbiturate and metabolites    200 Benzodiazepine                 200 Opiates and metabolites        300 Cocaine and metabolites        300 THC                            50 Performed at [facility], [address] [place], [place] [address]   Urinalysis, Routine w reflex microscopic -Urine, Clean Catch     Status: Abnormal   Collection Time: 03/13/23  2:20 PM  Result Value Ref Range   Color, Urine STRAW (A) YELLOW   APPearance CLEAR CLEAR   Specific Gravity, Urine 1.005 1.005 - 1.030   pH 5.0 5.0 - 8.0   Glucose, UA NEGATIVE NEGATIVE mg/dL   Hgb urine dipstick NEGATIVE NEGATIVE   Bilirubin Urine NEGATIVE NEGATIVE   Ketones, ur NEGATIVE NEGATIVE mg/dL   Protein, ur NEGATIVE NEGATIVE mg/dL   Nitrite NEGATIVE NEGATIVE   Leukocytes,Ua TRACE (A) NEGATIVE   RBC / HPF 0-5 0 - 5 RBC/hpf   WBC, UA 0-5 0 - 5 WBC/hpf   Bacteria, UA RARE (A) NONE SEEN   Squamous Epithelial / HPF 0-5 0 - 5 /HPF    Comment: Performed at [facility], [address], [place], [place] [address]  Glucose, capillary     Status: Abnormal   Collection Time: 03/13/23  5:20 PM  Result Value Ref Range   Glucose-Capillary 135 (H) 70 - 99 mg/dL    Comment: Glucose reference range applies only to samples taken after fasting for  at least 8 hours.  Glucose, capillary     Status: Abnormal   Collection Time: 03/13/23  9:04 PM  Result Value Ref Range   Glucose-Capillary 255 (H) 70 - 99 mg/dL    Comment: Glucose reference range applies only to samples taken after fasting for at least 8 hours.  CBC     Status: Abnormal   Collection Time: 03/13/23  9:42 PM  Result Value Ref Range   WBC 7.0 4.0 - 10.5 K/uL   RBC 2.82 (L) 3.87 - 5.11  MIL/uL   Hemoglobin 6.3 (LL) 12.0 - 15.0 g/dL    Comment: CRITICAL VALUE NOTED.  VALUE IS CONSISTENT WITH PREVIOUSLY REPORTED AND CALLED VALUE. REPEATED TO VERIFY CRITICAL RESULT CALLED TO, READ BACK BY AND VERIFIED WITH: VINCENT BOSU, RN 03/14/2023 0358 BTAYLOR    HCT 22.0 (L) 36.0 - 46.0 %   MCV 78.0 (L) 80.0 - 100.0 fL   MCH 22.3 (L) 26.0 - 34.0 pg   MCHC 28.6 (L) 30.0 - 36.0 g/dL   RDW 16.1 (H) 09.6 - 04.5 %   Platelets 314 150 - 400 K/uL    Comment: REPEATED TO VERIFY   nRBC 0.0 0.0 - 0.2 %    Comment: Performed at [facility], [address], [place], [place] [address]  Hemoglobin and hematocrit, blood     Status: Abnormal   Collection Time: 03/13/23 11:19 PM  Result Value Ref Range   Hemoglobin 6.2 (LL) 12.0 - 15.0 g/dL    Comment: CRITICAL VALUE NOTED.  VALUE IS CONSISTENT WITH PREVIOUSLY REPORTED AND CALLED VALUE. REPEATED TO VERIFY    HCT 21.6 (L) 36.0 - 46.0 %    Comment: Performed at [facility], [address], [place], [place] [address]  Basic metabolic panel     Status: Abnormal   Collection Time: 03/14/23  2:30 AM  Result Value Ref Range   Sodium 137 135 - 145 mmol/L   Potassium 3.9 3.5 - 5.1 mmol/L   Chloride 104 98 - 111 mmol/L   CO2 24 22 - 32 mmol/L   Glucose, Bld 149 (H) 70 - 99 mg/dL    Comment: Glucose reference range applies only to samples taken after fasting for at least 8 hours.   BUN 10 6 - 20 mg/dL   Creatinine, Ser 8.29 0.44 - 1.00 mg/dL   Calcium 8.0 (L) 8.9 - 10.3 mg/dL   GFR, Estimated >56 >21 mL/min    Comment:  (NOTE) Calculated using the CKD-EPI Creatinine Equation (2021)    Anion gap 9 5 - 15    Comment: Performed at [facility], [address], [place], [place] [address]  CBC     Status: Abnormal   Collection Time: 03/14/23  2:30 AM  Result Value Ref Range   WBC 5.7 4.0 - 10.5 K/uL   RBC 2.82 (L) 3.87 - 5.11 MIL/uL   Hemoglobin 6.5 (LL) 12.0 - 15.0 g/dL    Comment: CRITICAL VALUE NOTED.  VALUE IS CONSISTENT WITH PREVIOUSLY REPORTED AND CALLED VALUE. REPEATED TO VERIFY Reticulocyte Hemoglobin testing may be clinically indicated, consider ordering this additional test [id number]    HCT 22.5 (L) 36.0 - 46.0 %   MCV 79.8 (L) 80.0 - 100.0 fL   MCH 23.0 (L) 26.0 - 34.0 pg   MCHC 28.9 (L) 30.0 - 36.0 g/dL   RDW 52.8 (H) 41.3 - 24.4 %   Platelets 304 150 - 400 K/uL   nRBC 0.0 0.0 - 0.2 %    Comment: Performed at [facility], [address] [place], [place] [address]  Prepare RBC (crossmatch)     Status: None   Collection Time: 03/14/23  4:07 AM  Result Value Ref Range   Order Confirmation      ORDER PROCESSED BY BLOOD BANK Performed at [facility], [address], [place], [place] [address]   Type and screen [facility]     Status: None (Preliminary result)   Collection Time: 03/14/23  5:05  AM  Result Value Ref Range   ABO/RH(D) A POS    Antibody Screen POS    Sample Expiration 03/17/2023,2359    Antibody Identification ANTI M    Unit Number [id number]    Blood Component Type RED CELLS,LR    Unit division 00    Status of Unit ISSUED    Transfusion Status OK TO TRANSFUSE    Crossmatch Result COMPATIBLE    Donor AG Type NEGATIVE FOR M ANTIGEN    Unit Number [id number]    Blood Component Type RED CELLS,LR    Unit division 00    Status of Unit ALLOCATED    Transfusion Status OK TO TRANSFUSE    Crossmatch Result COMPATIBLE    Donor AG Type NEGATIVE FOR M ANTIGEN   Glucose, capillary     Status: Abnormal   Collection Time: 03/14/23  6:54  AM  Result Value Ref Range   Glucose-Capillary 117 (H) 70 - 99 mg/dL    Comment: Glucose reference range applies only to samples taken after fasting for at least 8 hours.  Glucose, capillary     Status: Abnormal   Collection Time: 03/14/23 11:13 AM  Result Value Ref Range   Glucose-Capillary 170 (H) 70 - 99 mg/dL    Comment: Glucose reference range applies only to samples taken after fasting for at least 8 hours.    Current Facility-Administered Medications  Medication Dose Route Frequency Provider Last Rate Last Admin   acetaminophen (TYLENOL) tablet 650 mg  650 mg Oral Q6H PRN Rancour, Stephen, MD   650 mg at 03/14/23 1226   albuterol (PROVENTIL) (2.5 MG/3ML) 0.083% nebulizer solution 2.5 mg  2.5 mg Nebulization Q6H PRN Gwyneth Sprout, MD   2.5 mg at 03/07/23 1801   albuterol (VENTOLIN HFA) 108 (90 Base) MCG/ACT inhaler 2 puff  2 puff Inhalation Q6H PRN Tegeler, Canary Brim, MD   2 puff at 03/14/23 0730   ARIPiprazole (ABILIFY) tablet 10 mg  10 mg Oral Daily Eligha Bridegroom, NP   10 mg at 03/14/23 5284   cyanocobalamin (VITAMIN B12) injection 1,000 mcg  1,000 mcg Intramuscular Daily Orland Mustard, MD   1,000 mcg at 03/14/23 0834   diphenhydrAMINE (BENADRYL) capsule 50 mg  50 mg Oral Q6H PRN Lenox Ponds, NP   50 mg at 03/14/23 1226   Or   diphenhydrAMINE (BENADRYL) injection 50 mg  50 mg Intramuscular Q6H PRN Lenox Ponds, NP   50 mg at 03/13/23 0131   [START ON 03/16/2023] ferrous sulfate tablet 325 mg  325 mg Oral Katherine Roan., MD       fluticasone furoate-vilanterol (BREO ELLIPTA) 200-25 MCG/ACT 1 puff  1 puff Inhalation Daily Gwyneth Sprout, MD   1 puff at 03/13/23 0802   folic acid (FOLVITE) tablet 1 mg  1 mg Oral Daily Ophelia Shoulder E, NP   1 mg at 03/14/23 1324   furosemide (LASIX) tablet 20 mg  20 mg Oral Daily Gwyneth Sprout, MD   20 mg at 03/14/23 4010   gabapentin (NEURONTIN) capsule 300 mg  300 mg Oral TID Ophelia Shoulder E, NP   300 mg at  03/14/23 2725   haloperidol (HALDOL) tablet 5 mg  5 mg Oral Q6H PRN Lenox Ponds, NP   5 mg at 03/14/23 1225   Or   haloperidol lactate (HALDOL) injection 5 mg  5 mg Intramuscular Q6H PRN Lenox Ponds, NP   5 mg at 03/13/23 0131   insulin aspart (novoLOG)  injection 0-15 Units  0-15 Units Subcutaneous TID WC Orland Mustard, MD   3 Units at 03/14/23 1224   LORazepam (ATIVAN) injection 2 mg  2 mg Intramuscular Once Lorre Nick, MD       LORazepam (ATIVAN) tablet 2 mg  2 mg Oral Q6H PRN Lenox Ponds, NP   2 mg at 03/14/23 1610   Or   LORazepam (ATIVAN) injection 2 mg  2 mg Intramuscular Q6H PRN Lenox Ponds, NP   2 mg at 03/13/23 1255   nicotine (NICODERM CQ - dosed in mg/24 hours) patch 21 mg  21 mg Transdermal Daily Gloris Manchester, MD   21 mg at 03/14/23 9604   nicotine polacrilex (NICORETTE) gum 2 mg  2 mg Oral PRN Melene Plan, DO   2 mg at 03/11/23 0923   ondansetron (ZOFRAN) tablet 4 mg  4 mg Oral Q6H Ophelia Shoulder E, NP   4 mg at 03/14/23 1226   pantoprazole (PROTONIX) injection 40 mg  40 mg Intravenous Q24H Orland Mustard, MD   40 mg at 03/14/23 1228   sucralfate (CARAFATE) tablet 1 g  1 g Oral TID WC & HS Ophelia Shoulder E, NP   1 g at 03/14/23 1226   thiamine (VITAMIN B1) tablet 100 mg  100 mg Oral Daily Ophelia Shoulder E, NP   100 mg at 03/14/23 5409   Or   thiamine (VITAMIN B1) injection 100 mg  100 mg Intravenous Daily Chales Abrahams, NP        Musculoskeletal:  Psychiatric Specialty Exam:Unable to assess due to sedation  Presentation  General Appearance:  Appropriate for Environment; Casual  Eye Contact: Good  Speech: SLurred  Physical Exam: Physical Exam Vitals and nursing note reviewed.  Constitutional:      Appearance: Normal appearance. She is normal weight.     Interventions: She is sedated and restrained.     Comments: Posey belt, received ativan   Pulmonary:     Effort: Tachypnea present.     Comments: Coughing after eating and drinking.  Skin:     General: Skin is warm and dry.     Capillary Refill: Capillary refill takes less than 2 seconds.  Neurological:     General: No focal deficit present.     Mental Status: She is alert. Mental status is at baseline. She is disoriented.  Psychiatric:        Attention and Perception: She is inattentive. She perceives visual hallucinations.        Mood and Affect: Affect normal. Mood is anxious.        Speech: Speech is slurred.        Behavior: Behavior is slowed. Behavior is cooperative.        Thought Content: Thought content is paranoid. Thought content is not delusional. Thought content does not include homicidal or suicidal ideation. Thought content does not include homicidal or suicidal plan.        Cognition and Memory: Cognition is impaired. Memory is impaired.        Judgment: Judgment normal.      Review of Systems  Psychiatric/Behavioral:  Positive for hallucinations (visual improving) and substance abuse (etoh).   All other systems reviewed and are negative.  Blood pressure 125/66, pulse (!) 108, temperature 98.3 F (36.8 C), temperature source Oral, resp. rate 18, height 5\' 2"  (1.575 m), weight 56 kg, last menstrual period 01/08/2011, SpO2 96%. Body mass index is 22.58 kg/m.  Treatment Plan Summary: Daily contact with  patient to assess and evaluate symptoms and progress in treatment, Medication management, and Plan    Continue Abilify 10mg  po qhs.  -Continue agitation protocol Continue delirium precautions.  Continue safety sitter for AMS/Psychosis once admitted to the floor. High risk to elope Will reorder IV thiamine once admitted to the floor. B1 level has been obtained.   -IVC renewed, currently expires on September 27.  She is currently under IVC for medical purposes, as she lacks capacity and was attempting to leave before being medically stable.   Continue CIWA and Ativan protocol at this time, for 2 additional days.   Labs reviewed and assessed. Patient with  multiple abnormalities --> defer to primary team. Hgb (7.0) , TIBC (552), UDS (+ BZD),   EKG  03/06/23 -QTc 433  Disposition: Psychiatric consult service will continue to follow, until patient achieve psychiatric stabilization. She continues to have some intermittent hallucinations and delusions that are improving. Patients current presentation and daily evaluations, does not warrant inpatient psychiatric hospitalization.  It is felt patient is nearing her baseline and current anemia, alcohol use, hospital admission are likely contributing to prolonged delirium.  Once patient is medically stable, may rescind IVC. Final disposition is pending.    Thedore Mins, MD 03/14/2023 2:03 PM

## 2023-03-14 NOTE — Progress Notes (Signed)
1 unit pRBC ordered not ready at this time. Cross-match sample still running per blood bank and this writer will be notified as soon the pRBC becomes available.

## 2023-03-15 DIAGNOSIS — E039 Hypothyroidism, unspecified: Secondary | ICD-10-CM | POA: Diagnosis not present

## 2023-03-15 DIAGNOSIS — F250 Schizoaffective disorder, bipolar type: Secondary | ICD-10-CM | POA: Diagnosis not present

## 2023-03-15 DIAGNOSIS — E538 Deficiency of other specified B group vitamins: Secondary | ICD-10-CM | POA: Diagnosis not present

## 2023-03-15 DIAGNOSIS — E871 Hypo-osmolality and hyponatremia: Secondary | ICD-10-CM | POA: Diagnosis not present

## 2023-03-15 DIAGNOSIS — I10 Essential (primary) hypertension: Secondary | ICD-10-CM | POA: Diagnosis not present

## 2023-03-15 DIAGNOSIS — Z96641 Presence of right artificial hip joint: Secondary | ICD-10-CM | POA: Diagnosis not present

## 2023-03-15 DIAGNOSIS — E119 Type 2 diabetes mellitus without complications: Secondary | ICD-10-CM | POA: Diagnosis not present

## 2023-03-15 DIAGNOSIS — I4891 Unspecified atrial fibrillation: Secondary | ICD-10-CM | POA: Diagnosis not present

## 2023-03-15 DIAGNOSIS — J449 Chronic obstructive pulmonary disease, unspecified: Secondary | ICD-10-CM | POA: Diagnosis not present

## 2023-03-15 DIAGNOSIS — Z79899 Other long term (current) drug therapy: Secondary | ICD-10-CM | POA: Diagnosis not present

## 2023-03-15 DIAGNOSIS — F17210 Nicotine dependence, cigarettes, uncomplicated: Secondary | ICD-10-CM | POA: Diagnosis not present

## 2023-03-15 DIAGNOSIS — Z7984 Long term (current) use of oral hypoglycemic drugs: Secondary | ICD-10-CM | POA: Diagnosis not present

## 2023-03-15 DIAGNOSIS — D62 Acute posthemorrhagic anemia: Secondary | ICD-10-CM | POA: Diagnosis not present

## 2023-03-15 LAB — CBC WITH DIFFERENTIAL/PLATELET
Abs Immature Granulocytes: 0.02 10*3/uL (ref 0.00–0.07)
Basophils Absolute: 0.1 10*3/uL (ref 0.0–0.1)
Basophils Relative: 1 %
Eosinophils Absolute: 0.1 10*3/uL (ref 0.0–0.5)
Eosinophils Relative: 2 %
HCT: 30.3 % — ABNORMAL LOW (ref 36.0–46.0)
Hemoglobin: 8.8 g/dL — ABNORMAL LOW (ref 12.0–15.0)
Immature Granulocytes: 0 %
Lymphocytes Relative: 29 %
Lymphs Abs: 2.3 10*3/uL (ref 0.7–4.0)
MCH: 23.3 pg — ABNORMAL LOW (ref 26.0–34.0)
MCHC: 29 g/dL — ABNORMAL LOW (ref 30.0–36.0)
MCV: 80.2 fL (ref 80.0–100.0)
Monocytes Absolute: 0.7 10*3/uL (ref 0.1–1.0)
Monocytes Relative: 9 %
Neutro Abs: 4.6 10*3/uL (ref 1.7–7.7)
Neutrophils Relative %: 59 %
Platelets: 348 10*3/uL (ref 150–400)
RBC: 3.78 MIL/uL — ABNORMAL LOW (ref 3.87–5.11)
RDW: 21.2 % — ABNORMAL HIGH (ref 11.5–15.5)
Smear Review: NORMAL
WBC: 7.9 10*3/uL (ref 4.0–10.5)
nRBC: 0 % (ref 0.0–0.2)

## 2023-03-15 LAB — GLUCOSE, CAPILLARY
Glucose-Capillary: 125 mg/dL — ABNORMAL HIGH (ref 70–99)
Glucose-Capillary: 172 mg/dL — ABNORMAL HIGH (ref 70–99)
Glucose-Capillary: 166 mg/dL — ABNORMAL HIGH (ref 70–99)
Glucose-Capillary: 262 mg/dL — ABNORMAL HIGH (ref 70–99)
Glucose-Capillary: 148 mg/dL — ABNORMAL HIGH (ref 70–99)

## 2023-03-15 LAB — COMPREHENSIVE METABOLIC PANEL
ALT: 24 U/L (ref 0–44)
AST: 30 U/L (ref 15–41)
Albumin: 3.3 g/dL — ABNORMAL LOW (ref 3.5–5.0)
Alkaline Phosphatase: 79 U/L (ref 38–126)
Anion gap: 11 (ref 5–15)
BUN: 8 mg/dL (ref 6–20)
CO2: 25 mmol/L (ref 22–32)
Calcium: 8.8 mg/dL — ABNORMAL LOW (ref 8.9–10.3)
Chloride: 99 mmol/L (ref 98–111)
Creatinine, Ser: 0.5 mg/dL (ref 0.44–1.00)
GFR, Estimated: >60 mL/min (ref >60)
Glucose, Bld: 101 mg/dL — ABNORMAL HIGH (ref 70–99)
Potassium: 4.2 mmol/L (ref 3.5–5.1)
Sodium: 135 mmol/L (ref 135–145)
Total Bilirubin: 0.5 mg/dL (ref 0.3–1.2)
Total Protein: 6.9 g/dL (ref 6.5–8.1)

## 2023-03-15 LAB — MAGNESIUM: Magnesium: 2 mg/dL (ref 1.7–2.4)

## 2023-03-15 LAB — PHOSPHORUS: Phosphorus: 4.4 mg/dL (ref 2.5–4.6)

## 2023-03-15 NOTE — Plan of Care (Signed)
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
## 2023-03-16 ENCOUNTER — Other Ambulatory Visit (HOSPITAL_COMMUNITY): Payer: Self-pay

## 2023-03-16 DIAGNOSIS — E538 Deficiency of other specified B group vitamins: Secondary | ICD-10-CM | POA: Diagnosis not present

## 2023-03-16 DIAGNOSIS — Z79899 Other long term (current) drug therapy: Secondary | ICD-10-CM | POA: Diagnosis not present

## 2023-03-16 DIAGNOSIS — E039 Hypothyroidism, unspecified: Secondary | ICD-10-CM | POA: Diagnosis not present

## 2023-03-16 DIAGNOSIS — I4891 Unspecified atrial fibrillation: Secondary | ICD-10-CM | POA: Diagnosis not present

## 2023-03-16 DIAGNOSIS — I10 Essential (primary) hypertension: Secondary | ICD-10-CM | POA: Diagnosis not present

## 2023-03-16 DIAGNOSIS — F17210 Nicotine dependence, cigarettes, uncomplicated: Secondary | ICD-10-CM | POA: Diagnosis not present

## 2023-03-16 DIAGNOSIS — J449 Chronic obstructive pulmonary disease, unspecified: Secondary | ICD-10-CM | POA: Diagnosis not present

## 2023-03-16 DIAGNOSIS — E871 Hypo-osmolality and hyponatremia: Secondary | ICD-10-CM | POA: Diagnosis not present

## 2023-03-16 DIAGNOSIS — E119 Type 2 diabetes mellitus without complications: Secondary | ICD-10-CM | POA: Diagnosis not present

## 2023-03-16 DIAGNOSIS — Z7984 Long term (current) use of oral hypoglycemic drugs: Secondary | ICD-10-CM | POA: Diagnosis not present

## 2023-03-16 DIAGNOSIS — Z96641 Presence of right artificial hip joint: Secondary | ICD-10-CM | POA: Diagnosis not present

## 2023-03-16 DIAGNOSIS — F250 Schizoaffective disorder, bipolar type: Secondary | ICD-10-CM | POA: Diagnosis not present

## 2023-03-16 DIAGNOSIS — D62 Acute posthemorrhagic anemia: Secondary | ICD-10-CM | POA: Diagnosis not present

## 2023-03-16 LAB — GLUCOSE, CAPILLARY
Glucose-Capillary: 167 mg/dL — ABNORMAL HIGH (ref 70–99)
Glucose-Capillary: 97 mg/dL (ref 70–99)
Glucose-Capillary: 102 mg/dL — ABNORMAL HIGH (ref 70–99)

## 2023-03-16 MED ORDER — NICOTINE 21 MG/24HR TD PT24
21.0000 mg | MEDICATED_PATCH | Freq: Every day | TRANSDERMAL | 0 refills | Status: DC
  Filled 2023-03-16: qty 28, 28d supply, fill #0

## 2023-03-16 MED ORDER — NICOTINE POLACRILEX 2 MG MT GUM
2.0000 mg | CHEWING_GUM | OROMUCOSAL | 0 refills | Status: DC | PRN
Start: 2023-03-16 — End: 2023-05-01
  Filled 2023-03-16: qty 100, fill #0

## 2023-03-16 MED ORDER — FOLIC ACID 1 MG PO TABS
1.0000 mg | ORAL_TABLET | Freq: Every day | ORAL | 1 refills | Status: AC
  Filled 2023-03-16: qty 30, 30d supply, fill #0

## 2023-03-16 MED ORDER — VITAMIN B-12 1000 MCG PO TABS
1000.0000 ug | ORAL_TABLET | Freq: Every day | ORAL | 0 refills | Status: AC
  Filled 2023-03-16: qty 130, 130d supply, fill #0

## 2023-03-16 MED ORDER — FERROUS SULFATE 325 (65 FE) MG PO TABS
325.0000 mg | ORAL_TABLET | ORAL | 0 refills | Status: DC
  Filled 2023-03-16: qty 100, 200d supply, fill #0

## 2023-03-16 MED ORDER — ARIPIPRAZOLE 10 MG PO TABS
10.0000 mg | ORAL_TABLET | Freq: Every day | ORAL | 0 refills | Status: DC
  Filled 2023-03-16: qty 30, 30d supply, fill #0

## 2023-03-16 MED ORDER — PANTOPRAZOLE SODIUM 40 MG PO TBEC
40.0000 mg | DELAYED_RELEASE_TABLET | Freq: Every day | ORAL | 0 refills | Status: DC
  Filled 2023-03-16: qty 30, 30d supply, fill #0

## 2023-03-16 MED ORDER — PANTOPRAZOLE SODIUM 40 MG PO TBEC
40.0000 mg | DELAYED_RELEASE_TABLET | Freq: Every day | ORAL | Status: DC
  Administered 2023-03-16: 40 mg via ORAL
  Filled 2023-03-16: qty 1

## 2023-03-16 NOTE — Consult Note (Signed)
[facility] Face-to-Face Psychiatry Consult   Reason for Consult:   Aggressive Behavior  Referring Physician:  Dr. Artis Flock Patient Identification: [name] MRN:  [id number] Principal Diagnosis: Schizoaffective disorder, bipolar type (HCC) Diagnosis:  Principal Problem:   Schizoaffective disorder, bipolar type (HCC) Active Problems:   COPD (chronic obstructive pulmonary disease) (HCC)   Essential hypertension   Type 2 diabetes mellitus (HCC)   Polysubstance abuse (HCC)   Substance induced mood disorder (HCC)   GERD (gastroesophageal reflux disease)   Hypothyroidism   Alcohol use disorder, severe, dependence (HCC)   Hyponatremia   Acute on chronic anemia   B12 deficiency   Total Time spent with patient: 20 minutes  Subjective:   [name] is a 57 y.o. female was seen and evaluated face to face by this provider, who she recognizes from prior evals.We are very familiar with this patient on the service line.   The patient was seen today at [facility] Emergency Department for a face-to-face psychiatric reevaluation. During this reevaluation, the patient continued to exhibit severe discharge focus, limited insight into the need for treatment, a dysphoric mood, and reported seeing bugs, as previously noted by family and others.  During evaluation [name] is seated at the edge of hospital bed. She is alert/oriented x 4; calm/cooperative; and mood congruent with affect.  Patient is speaking in a clear tone at moderate volume, and normal pace; with good eye contact.  Her thought process is coherent and relevant; There is no indication that she is currently responding to internal/external stimuli or experiencing delusional thought content.  Patient denies suicidal/self-harm/homicidal ideation, psychosis, and paranoia.  Patient has remained calm throughout assessment and has answered questions appropriately.  Over her 10 day LOS she has achieved stabilization through medication adjustment,  rest, and nutrition. Her current findings are now in incongruent with information obtained in IVC, and can be rescinded as she has completed treatment and able to participate in her care with appropriate decision making. She has no complaints at this time.   When discussing relapse prevention and strategies to reduce future hospitalizations, the patient expressed openness to working with an ACT team. She mentioned that she does not drink alcohol but only consumes beer. The patient explained that liquor has led her to make regrettable decisions, which is why she now limits herself to beer. However, she continues to show limited insight into the negative effects of alcohol use on her mental health.  HPI: per admission assessment note:  [name] is a 57 y.o. female with medical history significant of  schizophrenia, COPD, DM type II and chronic alcohol use presented to ED for psychiatric evaluation but was found to have downtrending hgb and [facility] was asked to admit.  She tells me she has no overt bleeding. Denies any N/V/D, change in bowels. Denies blood in stool or dark/tarry stool. She has no wounds. Denies blood in her urine. Denies any abdominal pain.   Past Psychiatric History " Polysubstance abuse, Depression, Schizoaffective;Bipolar type,  Previous dx's of schizophrenia, bipolar- Multiple inpatient stays (5) [facility], [facility]; last inpatient admission [place] in 01/2022 after suicide attempt- Follows up at [facility], previously had ACT team - hx of suicide attempt x2 via overdose as a teen, 12/2021 by OD"   Risk to Self:  Denies, despite endorsing overdose on admission Risk to Others:   yes, threatened brother Prior Inpatient Therapy:   Recently 02/13/23 following serious suicide attempt of high lethality Prior Outpatient  Therapy:  Denies  Past Medical History:  Past Medical History:  Diagnosis Date   Abdominal pain    Accidental drug overdose April 2013   Anxiety     Atrial fibrillation (HCC) 09/29/11   converted spontaneously   Chronic back pain    Chronic knee pain    Chronic nausea    Chronic pain    COPD (chronic obstructive pulmonary disease) (HCC)    Depression    Diabetes mellitus    states her doctor took her off all DM meds in past month   Diabetic neuropathy (HCC)    Dyspnea    with exertion    GERD (gastroesophageal reflux disease)    Headache(784.0)    migraines    HTN (hypertension)    not on meds since in a year    Hyperlipidemia    Hypothyroidism    not on meds in a while    Mental disorder    Bipolar and schizophrenic   Nausea and vomiting 01/02/2023   Requires supplemental oxygen    as needed per patient    Schizophrenia (HCC)    Schizophrenia, acute (HCC) 11/13/2017   Tobacco abuse     Past Surgical History:  Procedure Laterality Date   ABDOMINAL HYSTERECTOMY     BLADDER SUSPENSION  03/04/2011   Procedure: [facility] PROCEDURE;  Surgeon: Jonna Coup MacDiarmid;  Location: [facility] ORS;  Service: Urology;  Laterality: N/A;   BOWEL RESECTION N/A 04/18/2022   Procedure: SMALL BOWEL RESECTION;  Surgeon: Harriette Bouillon, MD;  Location: [facility] OR;  Service: General;  Laterality: N/A;   CYSTOCELE REPAIR  03/04/2011   Procedure: ANTERIOR REPAIR (CYSTOCELE);  Surgeon: Martina Sinner;  Location: [facility];  Service: Urology;  Laterality: N/A;   CYSTOSCOPY  03/04/2011   Procedure: CYSTOSCOPY;  Surgeon: Jonna Coup MacDiarmid;  Location: [facility];  Service: Urology;  Laterality: N/A;   ESOPHAGOGASTRODUODENOSCOPY (EGD) WITH PROPOFOL N/A 05/12/2017   Procedure: ESOPHAGOGASTRODUODENOSCOPY (EGD) WITH PROPOFOL;  Surgeon: Ovidio Kin, MD;  Location[facility];  Service: General;  Laterality: N/A;   GASTRIC ROUX-EN-Y N/A 03/25/2016   Procedure: LAPAROSCOPIC ROUX-EN-Y GASTRIC BYPASS WITH UPPER ENDOSCOPY;  Surgeon: Glenna Fellows, MD;  Location: [facility];  Service: General;  Laterality: N/A;   KNEE SURGERY     LAPAROSCOPIC ASSISTED VAGINAL HYSTERECTOMY   03/04/2011   Procedure: LAPAROSCOPIC ASSISTED VAGINAL HYSTERECTOMY;  Surgeon: Jeani Hawking, MD;  Location: [facility];  Service: Gynecology;  Laterality: N/A;   LAPAROTOMY N/A 04/18/2022   Procedure: EXPLORATORY LAPAROTOMY;  Surgeon: Harriette Bouillon, MD;  Location: [facility] OR;  Service: General;  Laterality: N/A;   LAPAROTOMY N/A 04/24/2022   Procedure: BRING BACK EXPLORATORY LAPAROTOMY;  Surgeon: Violeta Gelinas, MD;  Location: [facility] OR;  Service: General;  Laterality: N/A;   TOTAL HIP ARTHROPLASTY Right 08/27/2022   Procedure: TOTAL HIP ARTHROPLASTY;  Surgeon: Joen Laura, MD;  Location: [facility] OR;  Service: Orthopedics;  Laterality: Right;   Family History:  Family History  Problem Relation Age of Onset   Heart attack Father        47s   Diabetes Mother    Heart disease Mother    Hypertension Mother    Heart attack Sister        16   COPD Other    Breast cancer Neg Hx    Family Psychiatric  History:  Social History:  Social History   Substance and Sexual Activity  Alcohol Use Yes   Comment: 40 ounces daily  Social History   Substance and Sexual Activity  Drug Use Yes   Types: Cocaine, Marijuana, "Crack" cocaine    Social History   Socioeconomic History   Marital status: Legally Separated    Spouse name: Not on file   Number of children: Not on file   Years of education: Not on file   Highest education level: Not on file  Occupational History   Not on file  Tobacco Use   Smoking status: Every Day    Current packs/day: 1.00    Types: Cigarettes   Smokeless tobacco: Never  Vaping Use   Vaping status: Never Used  Substance and Sexual Activity   Alcohol use: Yes    Comment: 40 ounces daily   Drug use: Yes    Types: Cocaine, Marijuana, "Crack" cocaine   Sexual activity: Yes    Partners: Male  Other Topics Concern   Not on file  Social History Narrative   Not on file   Social Determinants of Health   Financial Resource Strain: Not on file  Food Insecurity:  No Food Insecurity (03/13/2023)   Hunger Vital Sign    Worried About Running Out of Food in the Last Year: Never true    Ran Out of Food in the Last Year: Never true  Transportation Needs: No Transportation Needs (03/13/2023)   PRAPARE - Administrator, Civil Service (Medical): No    Lack of Transportation (Non-Medical): No  Physical Activity: Not on file  Stress: Not on file  Social Connections: Unknown (01/06/2023)   Received from [facility]   Social Network    Social Network: Not on file   Additional Social History:    Allergies:   Allergies  Allergen Reactions   Iron Dextran Shortness Of Breath and Anxiety   Aspirin Nausea And Vomiting and Other (See Comments)    Ok to take tylenol or ibuprofen    Penicillins Other (See Comments)    Unknown. Childhood reaction     Labs:  Results for orders placed or performed during the hospital encounter of 03/06/23 (from the past 48 hour(s))  CBC     Status: Abnormal   Collection Time: 03/14/23  3:18 PM  Result Value Ref Range   WBC 7.4 4.0 - 10.5 K/uL   RBC 3.64 (L) 3.87 - 5.11 MIL/uL   Hemoglobin 8.7 (L) 12.0 - 15.0 g/dL    Comment: REPEATED TO VERIFY POST TRANSFUSION SPECIMEN    HCT 29.0 (L) 36.0 - 46.0 %   MCV 79.7 (L) 80.0 - 100.0 fL   MCH 23.9 (L) 26.0 - 34.0 pg   MCHC 30.0 30.0 - 36.0 g/dL   RDW 16.1 (H) 09.6 - 04.5 %   Platelets 344 150 - 400 K/uL   nRBC 0.0 0.0 - 0.2 %    Comment: Performed at [facility], [address], [place], [place] [address]  Glucose, capillary     Status: Abnormal   Collection Time: 03/14/23  4:39 PM  Result Value Ref Range   Glucose-Capillary 148 (H) 70 - 99 mg/dL    Comment: Glucose reference range applies only to samples taken after fasting for at least 8 hours.  Glucose, capillary     Status: Abnormal   Collection Time: 03/14/23  9:08 PM  Result Value Ref Range   Glucose-Capillary 121 (H) 70 - 99 mg/dL    Comment: Glucose reference range applies only to samples  taken after fasting for at least 8 hours.  Glucose,  capillary     Status: Abnormal   Collection Time: 03/15/23  6:23 AM  Result Value Ref Range   Glucose-Capillary 125 (H) 70 - 99 mg/dL    Comment: Glucose reference range applies only to samples taken after fasting for at least 8 hours.  CBC with Differential/Platelet     Status: Abnormal   Collection Time: 03/15/23  7:00 AM  Result Value Ref Range   WBC 7.9 4.0 - 10.5 K/uL   RBC 3.78 (L) 3.87 - 5.11 MIL/uL   Hemoglobin 8.8 (L) 12.0 - 15.0 g/dL   HCT 29.5 (L) 28.4 - 13.2 %   MCV 80.2 80.0 - 100.0 fL   MCH 23.3 (L) 26.0 - 34.0 pg   MCHC 29.0 (L) 30.0 - 36.0 g/dL   RDW 44.0 (H) 10.2 - 72.5 %   Platelets 348 150 - 400 K/uL   nRBC 0.0 0.0 - 0.2 %   Neutrophils Relative % 59 %   Neutro Abs 4.6 1.7 - 7.7 K/uL   Lymphocytes Relative 29 %   Lymphs Abs 2.3 0.7 - 4.0 K/uL   Monocytes Relative 9 %   Monocytes Absolute 0.7 0.1 - 1.0 K/uL   Eosinophils Relative 2 %   Eosinophils Absolute 0.1 0.0 - 0.5 K/uL   Basophils Relative 1 %   Basophils Absolute 0.1 0.0 - 0.1 K/uL   WBC Morphology MORPHOLOGY UNREMARKABLE    RBC Morphology MORPHOLOGY UNREMARKABLE    Smear Review Normal platelet morphology    Immature Granulocytes 0 %   Abs Immature Granulocytes 0.02 0.00 - 0.07 K/uL    Comment: Performed at [facility], [address], [place], [place] [address]  Comprehensive metabolic panel     Status: Abnormal   Collection Time: 03/15/23  7:00 AM  Result Value Ref Range   Sodium 135 135 - 145 mmol/L   Potassium 4.2 3.5 - 5.1 mmol/L   Chloride 99 98 - 111 mmol/L   CO2 25 22 - 32 mmol/L   Glucose, Bld 101 (H) 70 - 99 mg/dL    Comment: Glucose reference range applies only to samples taken after fasting for at least 8 hours.   BUN 8 6 - 20 mg/dL   Creatinine, Ser 0.34 0.44 - 1.00 mg/dL   Calcium 8.8 (L) 8.9 - 10.3 mg/dL   Total Protein 6.9 6.5 - 8.1 g/dL   Albumin 3.3 (L) 3.5 - 5.0 g/dL   AST 30 15 - 41 U/L   ALT 24 0 - 44 U/L    Alkaline Phosphatase 79 38 - 126 U/L   Total Bilirubin 0.5 0.3 - 1.2 mg/dL   GFR, Estimated >74 >25 mL/min    Comment: (NOTE) Calculated using the CKD-EPI Creatinine Equation (2021)    Anion gap 11 5 - 15    Comment: Performed at [facility], [address] [place], [place] [address]  Magnesium     Status: None   Collection Time: 03/15/23  7:00 AM  Result Value Ref Range   Magnesium 2.0 1.7 - 2.4 mg/dL    Comment: Performed at [facility], [address], [place], [place] [address]  Phosphorus     Status: None   Collection Time: 03/15/23  7:00 AM  Result Value Ref Range   Phosphorus 4.4 2.5 - 4.6 mg/dL    Comment: Performed at [facility], [address] [place], [place] [address]  Glucose, capillary     Status: Abnormal   Collection  Time: 03/15/23  1:14 PM  Result Value Ref Range   Glucose-Capillary 172 (H) 70 - 99 mg/dL    Comment: Glucose reference range applies only to samples taken after fasting for at least 8 hours.   Comment 1 Notify RN    Comment 2 Document in Chart   Glucose, capillary     Status: Abnormal   Collection Time: 03/15/23  3:21 PM  Result Value Ref Range   Glucose-Capillary 166 (H) 70 - 99 mg/dL    Comment: Glucose reference range applies only to samples taken after fasting for at least 8 hours.  Glucose, capillary     Status: Abnormal   Collection Time: 03/15/23  4:05 PM  Result Value Ref Range   Glucose-Capillary 262 (H) 70 - 99 mg/dL    Comment: Glucose reference range applies only to samples taken after fasting for at least 8 hours.  Glucose, capillary     Status: Abnormal   Collection Time: 03/15/23  9:05 PM  Result Value Ref Range   Glucose-Capillary 148 (H) 70 - 99 mg/dL    Comment: Glucose reference range applies only to samples taken after fasting for at least 8 hours.   Comment 1 Notify RN    Comment 2 Document in Chart   Glucose, capillary     Status: Abnormal   Collection Time: 03/16/23  6:15 AM  Result Value Ref  Range   Glucose-Capillary 167 (H) 70 - 99 mg/dL    Comment: Glucose reference range applies only to samples taken after fasting for at least 8 hours.   Comment 1 Notify RN    Comment 2 Document in Chart   Glucose, capillary     Status: None   Collection Time: 03/16/23 11:08 AM  Result Value Ref Range   Glucose-Capillary 97 70 - 99 mg/dL    Comment: Glucose reference range applies only to samples taken after fasting for at least 8 hours.    Current Facility-Administered Medications  Medication Dose Route Frequency Provider Last Rate Last Admin   acetaminophen (TYLENOL) tablet 650 mg  650 mg Oral Q6H PRN Rancour, Stephen, MD   650 mg at 03/16/23 0804   albuterol (PROVENTIL) (2.5 MG/3ML) 0.083% nebulizer solution 2.5 mg  2.5 mg Nebulization Q6H PRN Gwyneth Sprout, MD   2.5 mg at 03/07/23 1801   albuterol (VENTOLIN HFA) 108 (90 Base) MCG/ACT inhaler 2 puff  2 puff Inhalation Q6H PRN Tegeler, Canary Brim, MD   2 puff at 03/14/23 0730   ARIPiprazole (ABILIFY) tablet 10 mg  10 mg Oral Daily Eligha Bridegroom, NP   10 mg at 03/16/23 0804   cyanocobalamin (VITAMIN B12) injection 1,000 mcg  1,000 mcg Intramuscular Daily Orland Mustard, MD   1,000 mcg at 03/16/23 0816   diphenhydrAMINE (BENADRYL) capsule 50 mg  50 mg Oral Q6H PRN Lenox Ponds, NP   50 mg at 03/16/23 0804   Or   diphenhydrAMINE (BENADRYL) injection 50 mg  50 mg Intramuscular Q6H PRN Lenox Ponds, NP   50 mg at 03/13/23 0131   ferrous sulfate tablet 325 mg  325 mg Oral Katherine Roan., MD   325 mg at 03/16/23 0804   fluticasone furoate-vilanterol (BREO ELLIPTA) 200-25 MCG/ACT 1 puff  1 puff Inhalation Daily Gwyneth Sprout, MD   1 puff at 03/15/23 0724   folic acid (FOLVITE) tablet 1 mg  1 mg Oral Daily Ophelia Shoulder E, NP   1 mg at 03/16/23 0804   furosemide (LASIX)  tablet 20 mg  20 mg Oral Daily Gwyneth Sprout, MD   20 mg at 03/16/23 0804   gabapentin (NEURONTIN) capsule 300 mg  300 mg Oral TID Ophelia Shoulder E, NP   300 mg at 03/16/23 0804   haloperidol (HALDOL) tablet 5 mg  5 mg Oral Q6H PRN Lenox Ponds, NP   5 mg at 03/16/23 0805   Or   haloperidol lactate (HALDOL) injection 5 mg  5 mg Intramuscular Q6H PRN Lenox Ponds, NP   5 mg at 03/13/23 0131   insulin aspart (novoLOG) injection 0-15 Units  0-15 Units Subcutaneous TID WC Orland Mustard, MD   3 Units at 03/16/23 8295   LORazepam (ATIVAN) injection 2 mg  2 mg Intramuscular Once Lorre Nick, MD       LORazepam (ATIVAN) tablet 2 mg  2 mg Oral Q6H PRN Lenox Ponds, NP   2 mg at 03/16/23 6213   Or   LORazepam (ATIVAN) injection 2 mg  2 mg Intramuscular Q6H PRN Lenox Ponds, NP   2 mg at 03/13/23 1255   nicotine (NICODERM CQ - dosed in mg/24 hours) patch 21 mg  21 mg Transdermal Daily Gloris Manchester, MD   21 mg at 03/16/23 0815   nicotine polacrilex (NICORETTE) gum 2 mg  2 mg Oral PRN Melene Plan, DO   2 mg at 03/14/23 1530   ondansetron (ZOFRAN) tablet 4 mg  4 mg Oral Q6H Ophelia Shoulder E, NP   4 mg at 03/16/23 1155   pantoprazole (PROTONIX) EC tablet 40 mg  40 mg Oral Daily Pham, Minh Q, RPH-CPP   40 mg at 03/16/23 1204   sucralfate (CARAFATE) tablet 1 g  1 g Oral TID WC & HS Ophelia Shoulder E, NP   1 g at 03/16/23 1155   thiamine (VITAMIN B1) tablet 100 mg  100 mg Oral Daily Ophelia Shoulder E, NP   100 mg at 03/16/23 0865   Or   thiamine (VITAMIN B1) injection 100 mg  100 mg Intravenous Daily Chales Abrahams, NP        Musculoskeletal:  Psychiatric Specialty Exam:Unable to assess due to sedation  Presentation  General Appearance:  Appropriate for Environment; Casual  Eye Contact: Good  Speech: SLurred  Physical Exam: Physical Exam Vitals and nursing note reviewed.  Constitutional:      Appearance: Normal appearance. She is normal weight.     Interventions: She is sedated and restrained.     Comments: Posey belt, received ativan   Pulmonary:     Effort: Tachypnea present.     Comments: Coughing after eating  and drinking.  Skin:    General: Skin is warm and dry.     Capillary Refill: Capillary refill takes less than 2 seconds.  Neurological:     General: No focal deficit present.     Mental Status: She is alert. Mental status is at baseline.  Psychiatric:        Attention and Perception: Attention and perception normal. She is attentive. She does not perceive auditory hallucinations.        Mood and Affect: Mood and affect normal. Mood is not anxious.        Speech: Speech normal. Speech is not slurred.        Behavior: Behavior normal. Behavior is cooperative.        Thought Content: Thought content normal. Thought content is not delusional. Thought content does not include homicidal or suicidal ideation. Thought  content does not include homicidal or suicidal plan.        Cognition and Memory: Cognition and memory normal. Cognition is not impaired. Memory is not impaired. She does not exhibit impaired remote memory.        Judgment: Judgment normal.     Review of Systems  Psychiatric/Behavioral:  Positive for substance abuse (etoh). Negative for hallucinations (resolved).   All other systems reviewed and are negative.  Blood pressure 113/81, pulse 98, temperature 98.6 F (37 C), temperature source Oral, resp. rate 18, height 5\' 2"  (1.575 m), weight 56 kg, last menstrual period 01/08/2011, SpO2 98%. Body mass index is 22.58 kg/m.  Treatment Plan Summary: Daily contact with patient to assess and evaluate symptoms and progress in treatment, Medication management, and Plan    Continue Abilify 10mg  po qhs. She may now transition to [facility] Roanna Epley in an outpatient setting.  -Continue agitation protocol Continue delirium precautions.  DC safety sitter for at discharge patient is high risk to leave.  -Referral to [facility] ACT team has been placed in AVS. May also consider [facility].   -IVC rescinded. Will Herbalist. , currently expires on September 27.  She is currently  under IVC for medical purposes, as she lacks capacity and was attempting to leave before being medically stable.   DC CIWA.  Labs reviewed and assessed. Patient with multiple abnormalities --> defer to primary team. Hgb (7.0) , TIBC (552), UDS (+ BZD),   EKG  03/06/23 -QTc 433  Disposition: Psychiatric consult service will sign off at this time.  Maryagnes Amos, FNP 03/16/2023 1:34 PM

## 2023-03-16 NOTE — Plan of Care (Signed)

## 2023-03-16 NOTE — Progress Notes (Signed)
CSW notified that patient is cleared for discharge, change of commitment completed by psychiatry. CSW scanned change of commitment form to [facility] system.   Patient no longer under IVC, RN notified. No further TOC needs at this time.  Blenda Nicely, [place] Clinical Social Worker [phone]

## 2023-03-16 NOTE — Progress Notes (Signed)
Pt. Ambulated off unit with the sitter to go home. Pt. Refused to be wheeled out.

## 2023-03-16 NOTE — Progress Notes (Signed)
PHARMACIST - PHYSICIAN COMMUNICATION  DR:   Lowell Guitar  CONCERNING: IV to Oral Route Change Policy  RECOMMENDATION: This patient is receiving protonix by the intravenous route.  Based on criteria approved by the Pharmacy and Therapeutics Committee, the intravenous medication(s) is/are being converted to the equivalent oral dose form(s).   DESCRIPTION: These criteria include: The patient is eating (either orally or via tube) and/or has been taking other orally administered medications for a least 24 hours The patient has no evidence of active gastrointestinal bleeding or impaired GI absorption (gastrectomy, short bowel, patient on TNA or NPO).  If you have questions about this conversion, please contact the Pharmacy Department  []   [phone] )  [facility] []   [phone] )  [facility] [x]   [phone] )  [facility] []   [phone] )  [facility] []   [phone] )  [facility]

## 2023-03-16 NOTE — Discharge Summary (Signed)
Physician Discharge Summary  [name]:[id number] DOB: 1965/10/26 DOA: 03/06/2023  PCP: Fleet Contras, MD  Admit date: 03/06/2023 Discharge date: 03/16/2023  Time spent: 40 minutes  Recommendations for Outpatient Follow-up:  Follow outpatient CBC/CMP  Follow with PCP regarding medications - she's nonadherent, not taking many of these.  I left medications on her med list despite the fact she's not taking them due to nonadherence.  These should be reviewed with her outpatient primary prescriber. Started on abilify Iron, b12, folate supplementation -> follow outpatient Needs outpatient GI eval for anemia    Discharge Diagnoses:  Principal Problem:   Schizoaffective disorder, bipolar type (HCC) Active Problems:   Acute on chronic anemia   Alcohol use disorder, severe, dependence (HCC)   B12 deficiency   COPD (chronic obstructive pulmonary disease) (HCC)   Essential hypertension   Hyponatremia   Polysubstance abuse (HCC)   Substance induced mood disorder (HCC)   Type 2 diabetes mellitus (HCC)   GERD (gastroesophageal reflux disease)   Hypothyroidism   Discharge Condition: stable  Diet recommendation: heart healthy  Filed Weights   03/06/23 2132  Weight: 56 kg    History of present illness:   [name] is [name] 57 y.o. female with medical history significant of  schizophrenia, COPD, DM type II and chronic alcohol use presented to ED for psychiatric evaluation but was found to have downtrending hgb and [facility] was asked to admit.  She tells me she has no overt bleeding. Denies any N/V/D, change in bowels. Denies blood in stool or dark/tarry stool. She has no wounds. Denies blood in her urine. Denies any abdominal pain.    She was admitted and received 1 unit pRBC.  Labs showed iron def and low normal B12.  She was seen by GI who recommended outpatient evaluation.  Stable for discharge 9/23.   See below and previous notes for additional details   Hospital Course:   Assessment and Plan:  Acute on chronic anemia Iron Deficiency Anemia B12 Deficiency 57 year old female presenting to ED for psychiatric eval found to have acute on chronic anemia with hgb to 7.0. No acute bleeding and denies any sources of bleeding.  -hgb was 10.1 about 2 weeks ago -baseline around 8-10.  -labs c/w iron def anemia, b12 deficiency -  normal folate -check UA (bland) -now s/p 1 unit pRBC -> Hb stable  -GI consulted -> recommending outpatient workup of anemia, iron and b12 replacement  -transfuse to keep hgb >7.0    Alcohol use disorder, severe, dependence (HCC) Drinks 2.5 40 ounce beers/day Encourage cessation   B12 deficiency B12 of 212 in setting of chronic alcohol use Replace outpatient   COPD (chronic obstructive pulmonary disease) (HCC) No s/sx of exacerbation. Clear on exam  Continue maintenance breo and SABA prn    Essential hypertension Well controlled. Continue lasix 20mg  daily   Hyponatremia improved   Polysubstance abuse (HCC) Check UDS  Nicotine patch and is quite agitated she can not smoke    Schizoaffective disorder, bipolar type (HCC) IVC in place, expires on 9/27  Continue medication per psychiatry -> continue abilify 10 mg po at bedtime, consider transition to LAI abilify maintainna in outpatient setting, referral to [facility] in [place]   Substance induced mood disorder [facility]) Psychiatry consulted    Type 2 diabetes mellitus (HCC) Recent A1C of 7.4 Hold metformin SSI and acccuhecks QAC/HS    GERD (gastroesophageal reflux disease) Change to protonix with alcohol hx and acute on  chronic anemia  Continue carafate    Hypothyroidism TSH wnl 02/2023 On no medication  Consider further workup outpatient     Procedures: none   Consultations: Psych GI  Discharge Exam: Vitals:   03/16/23 0750 03/16/23 1112  BP: (!) 144/84 113/81  Pulse: 94 98  Resp: 18 18  Temp: 98.3 F (36.8 C) 98.6 F (37 C)  SpO2: 100% 98%   Eager  to discharge Discussed with daughter d/c plan   General: No acute distress. Walking around room.  Big smile when I discuss d/c. Lungs: unlabored Neurological: Alert Moves all extremities 4. Cranial nerves II through XII grossly intact. Extremities: No clubbing or cyanosis. No edema.   Discharge Instructions   Discharge Instructions     Call MD for:  difficulty breathing, headache or visual disturbances   Complete by: As directed    Call MD for:  extreme fatigue   Complete by: As directed    Call MD for:  hives   Complete by: As directed    Call MD for:  persistant dizziness or light-headedness   Complete by: As directed    Call MD for:  persistant nausea and vomiting   Complete by: As directed    Call MD for:  redness, tenderness, or signs of infection (pain, swelling, redness, odor or green/yellow discharge around incision site)   Complete by: As directed    Call MD for:  severe uncontrolled pain   Complete by: As directed    Call MD for:  temperature >100.4   Complete by: As directed    Diet - low sodium heart healthy   Complete by: As directed    Discharge instructions   Complete by: As directed    You were admitted for anemia.  You've improved after transfusion.  We'll send you home with iron, vitamin b12, and folate to take for your anemia.  You were seen by gastroenterology who did not think you needed any inpatient workup, but it's reasonable to follow up with gastroenterology as an outpatient.    You've been cleared by psych.  I'll prescribe abilify for you.    You have many medicines that you aren't taking.  You need to follow up with your outpatient PCP and review your medicines so you know what's essential and what can be discontinued.   Return for new, recurrent, or worsening symptoms.  Please ask your PCP to request records from this hospitalization so they know what was done and what the next steps will be.   Increase activity slowly   Complete by: As  directed       Allergies as of 03/16/2023       Reactions   Iron Dextran Shortness Of Breath, Anxiety   Aspirin Nausea And Vomiting, Other (See Comments)   Ok to take tylenol or ibuprofen   Penicillins Other (See Comments)   Unknown. Childhood reaction         Medication List     TAKE these medications    acetaminophen 325 MG tablet Commonly known as: TYLENOL Take 650 mg by mouth every 6 (six) hours as needed for mild pain.   albuterol 108 (90 Base) MCG/ACT inhaler Commonly known as: VENTOLIN HFA Inhale 1-2 puffs into the lungs every 6 (six) hours as needed for wheezing or shortness of breath.   ARIPiprazole 10 MG tablet Commonly known as: ABILIFY Take 1 tablet (10 mg total) by mouth daily. Follow up with psychiatry outpatient for medication refills and adjustments  budesonide-formoterol 160-4.5 MCG/ACT inhaler Commonly known as: Symbicort Inhale 2 puffs into the lungs 2 (two) times daily.   cyanocobalamin 1000 MCG tablet Commonly known as: VITAMIN B12 Take 1 tablet (1,000 mcg total) by mouth daily.   diclofenac Sodium 1 % Gel Commonly known as: VOLTAREN Apply 2 g topically 4 (four) times daily.   famotidine 20 MG tablet Commonly known as: PEPCID Take 1 tablet (20 mg total) by mouth daily.   ferrous sulfate 325 (65 FE) MG tablet Take 1 tablet (325 mg total) by mouth every other day. Start taking on: March 18, 2023   folic acid 1 MG tablet Commonly known as: FOLVITE Take 1 tablet (1 mg total) by mouth daily. Start taking on: March 17, 2023   furosemide 20 MG tablet Commonly known as: LASIX Take 1 tablet (20 mg total) by mouth daily.   gabapentin 100 MG capsule Commonly known as: NEURONTIN Take 100 mg by mouth 3 (three) times daily.   hyoscyamine 0.125 MG SL tablet Commonly known as: Levsin/SL Place 1 tablet (0.125 mg total) under the tongue every 4 (four) hours as needed (pain). Up to 1.25 mg daily   metFORMIN 750 MG 24 hr tablet Commonly  known as: GLUCOPHAGE-XR Take 1 tablet (750 mg total) by mouth daily.   nicotine 21 mg/24hr patch Commonly known as: NICODERM CQ - dosed in mg/24 hours Place 1 patch (21 mg total) onto the skin daily.   nicotine polacrilex 2 MG gum Commonly known as: NICORETTE Take 1 each (2 mg total) by mouth as needed for smoking cessation.   ondansetron 4 MG disintegrating tablet Commonly known as: ZOFRAN-ODT Take 1 tablet (4 mg total) by mouth every 8 (eight) hours as needed for nausea or vomiting.   ondansetron 4 MG tablet Commonly known as: ZOFRAN Take 1 tablet (4 mg total) by mouth every 6 (six) hours.   OXYGEN Inhale 4-5 L into the lungs as needed (COPD).   pantoprazole 40 MG tablet Commonly known as: PROTONIX Take 1 tablet (40 mg total) by mouth daily. Start taking on: March 17, 2023   sucralfate 1 g tablet Commonly known as: Carafate Take 1 tablet (1 g total) by mouth 4 (four) times daily -  with meals and at bedtime.   tiZANidine 2 MG tablet Commonly known as: ZANAFLEX Take 2 mg by mouth at bedtime.   zonisamide 100 MG capsule Commonly known as: ZONEGRAN Take 2 capsules (200 mg total) by mouth daily.       Allergies  Allergen Reactions   Iron Dextran Shortness Of Breath and Anxiety   Aspirin Nausea And Vomiting and Other (See Comments)    Ok to take tylenol or ibuprofen    Penicillins Other (See Comments)    Unknown. Childhood reaction     Follow-up Information     [facility] Follow up.   Why: Please follow up for act team services Contact information: [address] [place] [address] [phone]         [facility] Follow up.   Why: PLease follow up for act team services Contact information: [address] [place] [address] [phone]                  The results of significant diagnostics from this hospitalization (including imaging, microbiology, ancillary and laboratory) are listed  below for reference.    Significant Diagnostic Studies: CT ABDOMEN PELVIS W CONTRAST  Result Date: 03/01/2023 CLINICAL DATA:  Acute abdominal pain. EXAM: CT ABDOMEN AND PELVIS WITH CONTRAST TECHNIQUE: Multidetector CT imaging of the abdomen and pelvis was performed using the standard protocol following bolus administration of intravenous contrast. RADIATION DOSE REDUCTION: This exam was performed according to the departmental dose-optimization program which includes automated exposure control, adjustment of the mA and/or kV according to patient size and/or use of iterative reconstruction technique. CONTRAST:  75mL OMNIPAQUE IOHEXOL 350 MG/ML SOLN COMPARISON:  01/22/2023 FINDINGS: Lower Chest: No acute findings. Hepatobiliary: No suspicious hepatic masses identified. Tiny sub-cm low-attenuation lesion in posterior right hepatic lobe remains stable, likely representing Handy Mcloud tiny cyst. Gallbladder is unremarkable. No evidence of biliary ductal dilatation. Pancreas:  No mass or inflammatory changes. Spleen: Within normal limits in size and appearance. Adrenals/Urinary Tract: No suspicious masses identified. No evidence of ureteral calculi or hydronephrosis. 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 No evidence of obstruction, inflammatory process or abnormal fluid collections. Vascular/Lymphatic: No pathologically enlarged lymph nodes. No acute vascular findings. Reproductive: Poor visualization due to severe artifact from right hip prosthesis. Other:  None. Musculoskeletal:  No suspicious bone lesions identified. IMPRESSION: No acute findings or other significant abnormality. Electronically Signed   By: Danae Orleans M.D.   On: 03/01/2023 10:06   DG Chest 2 View  Result Date: 02/25/2023 CLINICAL DATA:  Chest pain, shortness of breath EXAM: CHEST - 2 VIEW COMPARISON:  02/23/2023 FINDINGS: Heart and mediastinal contours are within normal limits. No focal opacities or effusions. No acute bony  abnormality. IMPRESSION: No active cardiopulmonary disease. Electronically Signed   By: Charlett Nose M.D.   On: 02/25/2023 00:17   DG Chest Portable 1 View  Result Date: 02/23/2023 CLINICAL DATA:  Chest pain. EXAM: PORTABLE CHEST 1 VIEW COMPARISON:  Radiograph yesterday FINDINGS: Lower lung volumes from yesterday. Improving patchy bilateral airspace opacities, mild residual in the left mid lower lung zone. No new or progressive consolidation. No pulmonary edema, pleural effusion or pneumothorax. Stable osseous structures. IMPRESSION: Lower lung volumes from yesterday. Improving patchy bilateral airspace opacities, mild residual in the left mid lower lung zone. Electronically Signed   By: Narda Rutherford M.D.   On: 02/23/2023 22:21   DG THORACOLUMABAR SPINE  Result Date: 02/22/2023 CLINICAL DATA:  Back pain worse with motion and supine positioning EXAM: THORACOLUMBAR SPINE 1V COMPARISON:  08/07/2005, 02/14/2023 FINDINGS: Frontal and lateral views of the thoracolumbar spine are obtained, including T6 through L5. There are 5 non-rib-bearing lumbar type vertebral bodies. Alignment is grossly anatomic. There are stable anterior wedge compression deformities of T6 and T9. No new fractures are seen from the T6 through L5 levels. Mild spondylosis within the lower thoracic spine unchanged. Diffuse facet hypertrophic changes within the lumbar spine greatest at L4-5 and L5-S1. Paraspinal soft tissues are unremarkable. IMPRESSION: 1. Stable compression deformities at T6 and T9, unchanged since recent CT 02/14/2023. 2. Multilevel spondylosis and facet hypertrophy. 3. No new fractures. Electronically Signed   By: Sharlet Salina M.D.   On: 02/22/2023 17:09   DG Chest Port 1 View  Result Date: 02/22/2023 CLINICAL DATA:  chest pain and palpitations. EXAM: PORTABLE CHEST 1 VIEW COMPARISON:  02/20/2021 FINDINGS: Heart size and mediastinal contours are unremarkable. No pleural fluid or interstitial edema. New patchy opacities  within the perihilar left mid lung, left lower lung and right base identified. Visualized osseous structures are unremarkable. IMPRESSION: New patchy opacities within the perihilar left mid lung, left lower lung and right base. Findings may reflect areas of atelectasis or pneumonia. Electronically Signed  By: Signa Kell M.D.   On: 02/22/2023 06:48   DG Chest Portable 1 View  Result Date: 02/21/2023 CLINICAL DATA:  Shortness of breath.  Discharged earlier today. EXAM: PORTABLE CHEST 1 VIEW COMPARISON:  02/15/2023 FINDINGS: Numerous leads and wires project over the chest. Patient rotated minimally right. Midline trachea. Normal heart size. No pleural effusion or pneumothorax. Mild lower lung predominant interstitial thickening is likely related to smoking/chronic bronchitis. IMPRESSION: No acute cardiopulmonary disease. Electronically Signed   By: Jeronimo Greaves M.D.   On: 02/21/2023 18:54   DG Chest Port 1 View  Result Date: 02/15/2023 CLINICAL DATA:  Shortness of breath. EXAM: PORTABLE CHEST 1 VIEW COMPARISON:  CTA chest yesterday at 6:32 Darelle Kings.m., portable chest yesterday at 4:21 Naphtali Riede.m. FINDINGS: There is Cahlil Sattar relatively low inspiration on exam. Interstitial haziness in the lung bases could be due to low lung volumes or early pneumonitis. The sulci are sharp. There is mild cardiomegaly without evidence of CHF. The mediastinum is normally outlined. Thoracic spondylosis. IMPRESSION: 1. Interstitial haziness in the lung bases, could be due to low lung volumes or early pneumonitis. Follow-up study recommended in full inspiration. 2. Mild cardiomegaly without evidence of CHF. Electronically Signed   By: Almira Bar M.D.   On: 02/15/2023 01:42    Microbiology: No results found for this or any previous visit (from the past 240 hour(s)).   Labs: Basic Metabolic Panel: Recent Labs  Lab 03/12/23 0930 03/14/23 0230 03/15/23 0700  NA 131* 137 135  K 3.6 3.9 4.2  CL 96* 104 99  CO2 25 24 25   GLUCOSE 200*  149* 101*  BUN 7 10 8   CREATININE 0.50 0.48 0.50  CALCIUM 8.6* 8.0* 8.8*  MG  --   --  2.0  PHOS  --   --  4.4   Liver Function Tests: Recent Labs  Lab 03/12/23 0930 03/15/23 0700  AST 21 30  ALT 17 24  ALKPHOS 65 79  BILITOT 0.4 0.5  PROT 6.6 6.9  ALBUMIN 3.0* 3.3*   No results for input(s): "LIPASE", "AMYLASE" in the last 168 hours. No results for input(s): "AMMONIA" in the last 168 hours. CBC: Recent Labs  Lab 03/12/23 0930 03/13/23 0832 03/13/23 1142 03/13/23 2142 03/13/23 2319 03/14/23 0230 03/14/23 1518 03/15/23 0700  WBC 7.6   < > 7.3 7.0  --  5.7 7.4 7.9  NEUTROABS 5.0  --   --   --   --   --   --  4.6  HGB 7.2*   < > 8.3* 6.3* 6.2* 6.5* 8.7* 8.8*  HCT 25.7*   < > 28.6* 22.0* 21.6* 22.5* 29.0* 30.3*  MCV 79.8*   < > 81.3 78.0*  --  79.8* 79.7* 80.2  PLT 356   < > 244 314  --  304 344 348   < > = values in this interval not displayed.   Cardiac Enzymes: No results for input(s): "CKTOTAL", "CKMB", "CKMBINDEX", "TROPONINI" in the last 168 hours. BNP: BNP (last 3 results) Recent Labs    11/28/22 0645 02/10/23 0444 02/23/23 2151  BNP 63.9 79.5 350.8*    ProBNP (last 3 results) No results for input(s): "PROBNP" in the last 8760 hours.  CBG: Recent Labs  Lab 03/15/23 1521 03/15/23 1605 03/15/23 2105 03/16/23 0615 03/16/23 1108  GLUCAP 166* 262* 148* 167* 97       Signed:  Lacretia Nicks MD.  [facility] 03/16/2023, 2:45 PM

## 2023-03-17 ENCOUNTER — Other Ambulatory Visit: Payer: Self-pay

## 2023-03-17 ENCOUNTER — Encounter (HOSPITAL_COMMUNITY): Payer: Self-pay

## 2023-03-17 ENCOUNTER — Emergency Department (HOSPITAL_COMMUNITY)
Admission: EM | Admit: 2023-03-17 | Discharge: 2023-03-17 | Discharge status: Against Medical Advice | Payer: 59 | Attending: Emergency Medicine | Admitting: Emergency Medicine

## 2023-03-17 ENCOUNTER — Emergency Department (HOSPITAL_COMMUNITY)
Admission: EM | Admit: 2023-03-17 | Discharge: 2023-03-17 | Disposition: A | Discharge status: Home / Self Care | Payer: 59 | Attending: Emergency Medicine | Admitting: Emergency Medicine

## 2023-03-17 DIAGNOSIS — E1140 Type 2 diabetes mellitus with diabetic neuropathy, unspecified: Secondary | ICD-10-CM | POA: Diagnosis not present

## 2023-03-17 DIAGNOSIS — J441 Chronic obstructive pulmonary disease with (acute) exacerbation: Secondary | ICD-10-CM | POA: Insufficient documentation

## 2023-03-17 DIAGNOSIS — J449 Chronic obstructive pulmonary disease, unspecified: Secondary | ICD-10-CM | POA: Insufficient documentation

## 2023-03-17 DIAGNOSIS — E039 Hypothyroidism, unspecified: Secondary | ICD-10-CM | POA: Diagnosis not present

## 2023-03-17 DIAGNOSIS — Z7951 Long term (current) use of inhaled steroids: Secondary | ICD-10-CM | POA: Insufficient documentation

## 2023-03-17 DIAGNOSIS — F17200 Nicotine dependence, unspecified, uncomplicated: Secondary | ICD-10-CM | POA: Diagnosis not present

## 2023-03-17 DIAGNOSIS — I10 Essential (primary) hypertension: Secondary | ICD-10-CM | POA: Insufficient documentation

## 2023-03-17 DIAGNOSIS — R0602 Shortness of breath: Secondary | ICD-10-CM | POA: Insufficient documentation

## 2023-03-17 DIAGNOSIS — Z743 Need for continuous supervision: Secondary | ICD-10-CM | POA: Diagnosis not present

## 2023-03-17 LAB — VITAMIN B1: Vitamin B1 (Thiamine): 177.2 nmol/L (ref 66.5–200.0)
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
## 2023-03-17 NOTE — ED Notes (Signed)
Pt very anxious to leave & walked out the EMS bay door at time of d/c.

## 2023-03-17 NOTE — ED Provider Notes (Signed)
[place] EMERGENCY DEPARTMENT AT [facility] Provider Note   CSN: [id number] Arrival date & time: 03/17/23  2011     History  Chief Complaint  Patient presents with   Shortness of Breath    [name] is a 57 y.o. female.  Pt is a 39 you female with pmhx significant for COPD, anemia, polysubstance abuse and schizoaffective disorder.  Pt was here earlier today and was d/c home around noon.  Pt called EMS again today due to sob.  She admits to crack and alcohol abuse.        Home Medications Prior to Admission medications   Medication Sig Start Date End Date Taking? Authorizing Provider  acetaminophen (TYLENOL) 325 MG tablet Take 650 mg by mouth every 6 (six) hours as needed for mild pain.    [provider]  albuterol (VENTOLIN HFA) 108 (90 Base) MCG/ACT inhaler Inhale 1-2 puffs into the lungs every 6 (six) hours as needed for wheezing or shortness of breath. 02/25/23   Sabas Sous, MD  ARIPiprazole (ABILIFY) 10 MG tablet Take 1 tablet (10 mg total) by mouth daily. Follow up with psychiatry outpatient for medication refills and adjustments 03/16/23 04/15/23  Zigmund Daniel., MD  budesonide-formoterol [facility]) 160-4.5 MCG/ACT inhaler Inhale 2 puffs into the lungs 2 (two) times daily. 02/25/23   Sabas Sous, MD  cyanocobalamin (VITAMIN B12) 1000 MCG tablet Take 1 tablet (1,000 mcg total) by mouth daily. 03/16/23 07/24/23  Zigmund Daniel., MD  diclofenac Sodium (VOLTAREN) 1 % GEL Apply 2 g topically 4 (four) times daily. Patient not taking: Reported on 03/07/2023 02/19/23   [provider]  famotidine (PEPCID) 20 MG tablet Take 1 tablet (20 mg total) by mouth daily. Patient not taking: Reported on 03/07/2023 02/25/23   Sabas Sous, MD  ferrous sulfate 325 (65 FE) MG tablet Take 1 tablet (325 mg total) by mouth every other day. 03/18/23 10/02/23  Zigmund Daniel., MD  folic acid (FOLVITE) 1 MG tablet Take 1 tablet (1 mg total) by  mouth daily. 03/17/23 05/16/23  Zigmund Daniel., MD  furosemide (LASIX) 20 MG tablet Take 1 tablet (20 mg total) by mouth daily. Patient not taking: Reported on 03/07/2023 02/24/23   Achille Rich, PA-C  gabapentin (NEURONTIN) 100 MG capsule Take 100 mg by mouth 3 (three) times daily. Patient not taking: Reported on 03/07/2023 02/19/23   [provider]  hyoscyamine (LEVSIN/SL) 0.125 MG SL tablet Place 1 tablet (0.125 mg total) under the tongue every 4 (four) hours as needed (pain). Up to 1.25 mg daily Patient not taking: Reported on 02/22/2023 01/23/23   Arthor Captain, PA-C  nicotine (NICODERM CQ - DOSED IN MG/24 HOURS) 21 mg/24hr patch Place 1 patch (21 mg total) onto the skin daily. 03/16/23   Zigmund Daniel., MD  nicotine polacrilex (NICORETTE) 2 MG gum Take 1 each (2 mg total) by mouth as needed for smoking cessation. 03/16/23   Zigmund Daniel., MD  ondansetron (ZOFRAN) 4 MG tablet Take 1 tablet (4 mg total) by mouth every 6 (six) hours. Patient not taking: Reported on 02/22/2023 01/20/23   Prosperi, Christian H, PA-C  ondansetron (ZOFRAN-ODT) 4 MG disintegrating tablet Take 1 tablet (4 mg total) by mouth every 8 (eight) hours as needed for nausea or vomiting. Patient not taking: Reported on 03/07/2023 03/01/23   Laurence Spates, MD  OXYGEN Inhale 4-5 L into the lungs as needed (COPD).  [provider]  pantoprazole (PROTONIX) 40 MG tablet Take 1 tablet (40 mg total) by mouth daily. 03/17/23 04/16/23  Zigmund Daniel., MD  sucralfate (CARAFATE) 1 g tablet Take 1 tablet (1 g total) by mouth 4 (four) times daily -  with meals and at bedtime. Patient not taking: Reported on 02/22/2023 02/11/23   Horton, Mayer Masker, MD  tiZANidine (ZANAFLEX) 2 MG tablet Take 2 mg by mouth at bedtime. Patient not taking: Reported on 03/07/2023 02/19/23   [provider]  zonisamide (ZONEGRAN) 100 MG capsule Take 2 capsules (200 mg total) by mouth daily. Patient not taking:  Reported on 02/22/2023 01/12/23   Lewie Chamber, MD      Allergies    Iron dextran, Aspirin, and Penicillins    Review of Systems   Review of Systems  Respiratory:  Positive for shortness of breath.   All other systems reviewed and are negative.   Physical Exam Updated Vital Signs BP 102/68   Pulse (!) 116   Temp 98.1 F (36.7 C) (Oral)   Resp (!) 21   Ht 5\' 2"  (1.575 m)   Wt 58.1 kg   LMP 01/08/2011   SpO2 96%   BMI 23.43 kg/m  Physical Exam Vitals and nursing note reviewed.  Constitutional:      Appearance: She is well-developed.  HENT:     Head: Normocephalic and atraumatic.     Mouth/Throat:     Mouth: Mucous membranes are moist.     Pharynx: Oropharynx is clear.  Eyes:     Extraocular Movements: Extraocular movements intact.     Pupils: Pupils are equal, round, and reactive to light.  Cardiovascular:     Rate and Rhythm: Regular rhythm. Tachycardia present.  Pulmonary:     Breath sounds: Wheezing present.  Abdominal:     General: Bowel sounds are normal.     Palpations: Abdomen is soft.  Musculoskeletal:        General: Normal range of motion.     Cervical back: Normal range of motion and neck supple.  Skin:    General: Skin is warm.     Capillary Refill: Capillary refill takes less than 2 seconds.  Neurological:     General: No focal deficit present.     Mental Status: She is alert and oriented to person, place, and time.  Psychiatric:        Mood and Affect: Mood normal.        Behavior: Behavior normal.     ED Results / Procedures / Treatments   Labs (all labs ordered are listed, but only abnormal results are displayed) Labs Reviewed - No data to display  EKG None  Radiology No results found.  Procedures Procedures    Medications Ordered in ED Medications - No data to display  ED Course/ Medical Decision Making/ A&P                                 Medical Decision Making  This patient presents to the ED for concern of sob, this  involves an extensive number of treatment options, and is a complaint that carries with it a high risk of complications and morbidity.  The differential diagnosis includes copd exac, pna, anemia, covid   Co morbidities that complicate the patient evaluation  COPD, anemia, polysubstance abuse and schizoaffective disorder   Additional history obtained:  Additional history obtained from epic chart review  External records from outside source obtained and reviewed including EMS report  Cardiac Monitoring:  The patient was maintained on a cardiac monitor.  I personally viewed and interpreted the cardiac monitored which showed an underlying rhythm of: st   Medicines ordered and prescription drug management:   I have reviewed the patients home medicines and have made adjustments as needed    Problem List / ED Course:  Sob:  likely a COPD exac.  Pt decided that she did not want to be here any more and asked to leave.  She did not want any labs, CXR, or meds for sx.  She is awake and alert and able to make decisions.  She knows to return if worse.   Reevaluation:  After the interventions noted above, I reevaluated the patient and found that they have :improved   Social Determinants of Health:  Lives at home   Dispostion:  AMA.          Final Clinical Impression(s) / ED Diagnoses Final diagnoses:  COPD exacerbation [facility])    Rx / DC Orders ED Discharge Orders     None         Jacalyn Lefevre, MD 03/17/23 2204

## 2023-03-17 NOTE — Discharge Planning (Signed)
TOC following for discharge needs.

## 2023-03-17 NOTE — Discharge Instructions (Signed)
Return to the ED with any new or worsening symptoms

## 2023-03-17 NOTE — ED Triage Notes (Signed)
Pt BIBEMS from home with c/o SOB s/p crack & ETOH use. O2 via [place] @4L /min

## 2023-03-18 ENCOUNTER — Emergency Department (HOSPITAL_COMMUNITY)
Admission: EM | Admit: 2023-03-18 | Discharge: 2023-03-18 | Disposition: A | Discharge status: Home / Self Care | Payer: 59 | Attending: Emergency Medicine | Admitting: Emergency Medicine

## 2023-03-18 ENCOUNTER — Encounter (HOSPITAL_COMMUNITY): Payer: Self-pay | Admitting: Radiology

## 2023-03-18 ENCOUNTER — Emergency Department (HOSPITAL_COMMUNITY): Payer: 59

## 2023-03-18 ENCOUNTER — Emergency Department (HOSPITAL_COMMUNITY)
Admission: EM | Admit: 2023-03-18 | Discharge: 2023-03-18 | Disposition: A | Discharge status: Home / Self Care | Payer: 59 | Source: Home / Self Care

## 2023-03-18 ENCOUNTER — Encounter (HOSPITAL_COMMUNITY): Payer: Self-pay

## 2023-03-18 DIAGNOSIS — E039 Hypothyroidism, unspecified: Secondary | ICD-10-CM | POA: Insufficient documentation

## 2023-03-18 DIAGNOSIS — R6889 Other general symptoms and signs: Secondary | ICD-10-CM | POA: Diagnosis not present

## 2023-03-18 DIAGNOSIS — Z7984 Long term (current) use of oral hypoglycemic drugs: Secondary | ICD-10-CM | POA: Diagnosis not present

## 2023-03-18 DIAGNOSIS — Z7951 Long term (current) use of inhaled steroids: Secondary | ICD-10-CM | POA: Diagnosis not present

## 2023-03-18 DIAGNOSIS — R0989 Other specified symptoms and signs involving the circulatory and respiratory systems: Secondary | ICD-10-CM | POA: Diagnosis not present

## 2023-03-18 DIAGNOSIS — R531 Weakness: Secondary | ICD-10-CM | POA: Diagnosis not present

## 2023-03-18 DIAGNOSIS — Z7952 Long term (current) use of systemic steroids: Secondary | ICD-10-CM | POA: Diagnosis not present

## 2023-03-18 DIAGNOSIS — Z79899 Other long term (current) drug therapy: Secondary | ICD-10-CM | POA: Diagnosis not present

## 2023-03-18 DIAGNOSIS — R000 Tachycardia, unspecified: Secondary | ICD-10-CM | POA: Diagnosis not present

## 2023-03-18 DIAGNOSIS — R109 Unspecified abdominal pain: Secondary | ICD-10-CM | POA: Insufficient documentation

## 2023-03-18 DIAGNOSIS — I10 Essential (primary) hypertension: Secondary | ICD-10-CM | POA: Insufficient documentation

## 2023-03-18 DIAGNOSIS — R112 Nausea with vomiting, unspecified: Secondary | ICD-10-CM | POA: Insufficient documentation

## 2023-03-18 DIAGNOSIS — E1140 Type 2 diabetes mellitus with diabetic neuropathy, unspecified: Secondary | ICD-10-CM | POA: Diagnosis not present

## 2023-03-18 DIAGNOSIS — M79672 Pain in left foot: Secondary | ICD-10-CM | POA: Diagnosis not present

## 2023-03-18 DIAGNOSIS — R0602 Shortness of breath: Secondary | ICD-10-CM | POA: Diagnosis not present

## 2023-03-18 DIAGNOSIS — J449 Chronic obstructive pulmonary disease, unspecified: Secondary | ICD-10-CM | POA: Diagnosis not present

## 2023-03-18 DIAGNOSIS — I499 Cardiac arrhythmia, unspecified: Secondary | ICD-10-CM | POA: Diagnosis not present

## 2023-03-18 DIAGNOSIS — F419 Anxiety disorder, unspecified: Secondary | ICD-10-CM | POA: Insufficient documentation

## 2023-03-18 DIAGNOSIS — R1084 Generalized abdominal pain: Secondary | ICD-10-CM | POA: Diagnosis not present

## 2023-03-18 DIAGNOSIS — Z743 Need for continuous supervision: Secondary | ICD-10-CM | POA: Diagnosis not present

## 2023-03-18 LAB — TYPE AND SCREEN
ABO/RH(D): A POS
Antibody Screen: POSITIVE
Donor AG Type: NEGATIVE
Donor AG Type: NEGATIVE
Unit division: 0
Unit division: 0

## 2023-03-18 LAB — CBC WITH DIFFERENTIAL/PLATELET
Abs Immature Granulocytes: 0.03 10*3/uL (ref 0.00–0.07)
Basophils Absolute: 0.1 10*3/uL (ref 0.0–0.1)
Basophils Relative: 1 %
Eosinophils Absolute: 0 10*3/uL (ref 0.0–0.5)
Eosinophils Relative: 0 %
HCT: 28.3 % — ABNORMAL LOW (ref 36.0–46.0)
Hemoglobin: 8.6 g/dL — ABNORMAL LOW (ref 12.0–15.0)
Immature Granulocytes: 0 %
Lymphocytes Relative: 11 %
Lymphs Abs: 1.1 10*3/uL (ref 0.7–4.0)
MCH: 23.6 pg — ABNORMAL LOW (ref 26.0–34.0)
MCHC: 30.4 g/dL (ref 30.0–36.0)
MCV: 77.7 fL — ABNORMAL LOW (ref 80.0–100.0)
Monocytes Absolute: 0.6 10*3/uL (ref 0.1–1.0)
Monocytes Relative: 6 %
Neutro Abs: 8.3 10*3/uL — ABNORMAL HIGH (ref 1.7–7.7)
Neutrophils Relative %: 82 %
Platelets: 362 10*3/uL (ref 150–400)
RBC: 3.64 MIL/uL — ABNORMAL LOW (ref 3.87–5.11)
RDW: 21.8 % — ABNORMAL HIGH (ref 11.5–15.5)
WBC: 10.1 10*3/uL (ref 4.0–10.5)
nRBC: 0 % (ref 0.0–0.2)

## 2023-03-18 LAB — COMPREHENSIVE METABOLIC PANEL
ALT: 21 U/L (ref 0–44)
AST: 23 U/L (ref 15–41)
Albumin: 3.5 g/dL (ref 3.5–5.0)
Alkaline Phosphatase: 74 U/L (ref 38–126)
Anion gap: 13 (ref 5–15)
BUN: 10 mg/dL (ref 6–20)
CO2: 19 mmol/L — ABNORMAL LOW (ref 22–32)
Calcium: 8.6 mg/dL — ABNORMAL LOW (ref 8.9–10.3)
Chloride: 105 mmol/L (ref 98–111)
Creatinine, Ser: 0.33 mg/dL — ABNORMAL LOW (ref 0.44–1.00)
GFR, Estimated: >60 mL/min (ref >60)
Glucose, Bld: 192 mg/dL — ABNORMAL HIGH (ref 70–99)
Potassium: 4 mmol/L (ref 3.5–5.1)
Sodium: 137 mmol/L (ref 135–145)
Total Bilirubin: 0.7 mg/dL (ref 0.3–1.2)
Total Protein: 7.1 g/dL (ref 6.5–8.1)

## 2023-03-18 LAB — I-STAT CHEM 8, ED
BUN: 10 mg/dL (ref 6–20)
Calcium, Ion: 1.11 mmol/L — ABNORMAL LOW (ref 1.15–1.40)
Chloride: 104 mmol/L (ref 98–111)
Creatinine, Ser: 0.4 mg/dL — ABNORMAL LOW (ref 0.44–1.00)
Glucose, Bld: 225 mg/dL — ABNORMAL HIGH (ref 70–99)
HCT: 31 % — ABNORMAL LOW (ref 36.0–46.0)
Hemoglobin: 10.5 g/dL — ABNORMAL LOW (ref 12.0–15.0)
Potassium: 4.1 mmol/L (ref 3.5–5.1)
Sodium: 138 mmol/L (ref 135–145)
TCO2: 24 mmol/L (ref 22–32)

## 2023-03-18 LAB — CBC
HCT: 29.7 % — ABNORMAL LOW (ref 36.0–46.0)
Hemoglobin: 8.6 g/dL — ABNORMAL LOW (ref 12.0–15.0)
MCH: 24.1 pg — ABNORMAL LOW (ref 26.0–34.0)
MCHC: 29 g/dL — ABNORMAL LOW (ref 30.0–36.0)
MCV: 83.2 fL (ref 80.0–100.0)
Platelets: 314 10*3/uL (ref 150–400)
RBC: 3.57 MIL/uL — ABNORMAL LOW (ref 3.87–5.11)
RDW: 22 % — ABNORMAL HIGH (ref 11.5–15.5)
WBC: 8 10*3/uL (ref 4.0–10.5)
nRBC: 0 % (ref 0.0–0.2)

## 2023-03-18 LAB — BPAM RBC
Blood Product Expiration Date: 202410022359
Blood Product Expiration Date: 202410162359
ISSUE DATE / TIME: 202409210846
Unit Type and Rh: 6200
Unit Type and Rh: 6200

## 2023-03-18 LAB — TROPONIN I (HIGH SENSITIVITY)
Troponin I (High Sensitivity): 3 ng/L (ref <18)
Troponin I (High Sensitivity): 5 ng/L (ref <18)

## 2023-03-18 LAB — LIPASE, BLOOD: Lipase: 42 U/L (ref 11–51)

## 2023-03-18 MED ORDER — CARBAMAZEPINE 200 MG PO TABS: ORAL_TABLET | ORAL | 0 refills | Status: DC

## 2023-03-18 MED ORDER — SODIUM CHLORIDE (PF) 0.9 % IJ SOLN
INTRAMUSCULAR | Status: AC
  Filled 2023-03-18: qty 50

## 2023-03-18 MED ORDER — HALOPERIDOL LACTATE 5 MG/ML IJ SOLN
5.0000 mg | Freq: Once | INTRAMUSCULAR | Status: AC
  Administered 2023-03-18: 5 mg via INTRAVENOUS
  Filled 2023-03-18: qty 1

## 2023-03-18 MED ORDER — LORAZEPAM 2 MG/ML IJ SOLN: 0.0000 mg | Freq: Two times a day (BID) | INTRAMUSCULAR | Status: DC

## 2023-03-18 MED ORDER — KETOROLAC TROMETHAMINE 30 MG/ML IJ SOLN
15.0000 mg | Freq: Once | INTRAMUSCULAR | Status: AC
  Administered 2023-03-18: 15 mg via INTRAVENOUS
  Filled 2023-03-18: qty 1

## 2023-03-18 MED ORDER — SODIUM CHLORIDE 0.9 % IV BOLUS
500.0000 mL | Freq: Once | INTRAVENOUS | Status: AC
  Administered 2023-03-18: 500 mL via INTRAVENOUS

## 2023-03-18 MED ORDER — LACTATED RINGERS IV BOLUS
1000.0000 mL | Freq: Once | INTRAVENOUS | Status: AC
  Administered 2023-03-18: 1000 mL via INTRAVENOUS

## 2023-03-18 MED ORDER — THIAMINE HCL 100 MG/ML IJ SOLN
100.0000 mg | Freq: Every day | INTRAMUSCULAR | Status: DC
  Administered 2023-03-18: 100 mg via INTRAVENOUS
  Filled 2023-03-18: qty 2

## 2023-03-18 MED ORDER — ONDANSETRON HCL 4 MG/2ML IJ SOLN
4.0000 mg | Freq: Once | INTRAMUSCULAR | Status: AC
  Administered 2023-03-18: 4 mg via INTRAVENOUS
  Filled 2023-03-18: qty 2

## 2023-03-18 MED ORDER — LORAZEPAM 2 MG/ML IJ SOLN
0.0000 mg | Freq: Four times a day (QID) | INTRAMUSCULAR | Status: DC
  Administered 2023-03-18: 2 mg via INTRAVENOUS
  Filled 2023-03-18: qty 1

## 2023-03-18 MED ORDER — LORAZEPAM 1 MG PO TABS: 0.0000 mg | ORAL_TABLET | Freq: Four times a day (QID) | ORAL | Status: DC

## 2023-03-18 MED ORDER — LORAZEPAM 1 MG PO TABS: 0.0000 mg | ORAL_TABLET | Freq: Two times a day (BID) | ORAL | Status: DC

## 2023-03-18 MED ORDER — IOHEXOL 300 MG/ML  SOLN
100.0000 mL | Freq: Once | INTRAMUSCULAR | Status: AC | PRN
  Administered 2023-03-18: 100 mL via INTRAVENOUS

## 2023-03-18 MED ORDER — THIAMINE MONONITRATE 100 MG PO TABS: 100.0000 mg | ORAL_TABLET | Freq: Every day | ORAL | Status: DC

## 2023-03-18 MED ORDER — ALBUTEROL SULFATE HFA 108 (90 BASE) MCG/ACT IN AERS
1.0000 | INHALATION_SPRAY | Freq: Four times a day (QID) | RESPIRATORY_TRACT | 0 refills | Status: DC | PRN
Start: 2023-03-18 — End: 2023-04-01

## 2023-03-18 NOTE — ED Triage Notes (Signed)
Pt c/o vomiting pt was seen and dc this morning states is now having abd pain and vomiting.

## 2023-03-18 NOTE — Discharge Instructions (Addendum)
Please follow-up with your primary doctor.  You may take over-the-counter Tylenol alternate with Motrin for pain.  Return immediately if develop fevers, chills, shortness of breath, severe pain or any new or worsening symptoms that are concerning to you.

## 2023-03-18 NOTE — ED Notes (Signed)
Pt given [place] sandwich and water

## 2023-03-18 NOTE — ED Triage Notes (Signed)
Pt arrives via [facility] for c/o shortness of breath. Pt was seen earlier today at [facility] ER and d/c'd home. EMS reports RA SPO2 98% and that pt is 100% on 4L/min [place] which she is supposed to wear at all times. Pt reports she received nebulizer treatments at [facility] today but that "they didn't do anything." Pt does not exhibit any increase in WOB and in no apparent respiratory distress at this time. Pt tremulous all over, tachycardic, c/o being hot, and sweating at this time. Pt has history of ETOH abuse but reports her last drink of alcohol was yesterday, and she drinks approx a 40 oz beer daily. Pt only c/o "I think I'm sick." Pt denies any pain, n/v, fever, chills at this time.

## 2023-03-18 NOTE — ED Provider Notes (Signed)
[place] EMERGENCY DEPARTMENT AT [facility] Provider Note   CSN: [id number] Arrival date & time: 03/18/23  2130     History  Chief Complaint  Patient presents with   Emesis    [name] is a 57 y.o. female.  57 year old female presenting emergency department for abdominal pain with nausea.  Seen in the emergency department last night for COPD exacerbation discharge.  Reported she developed abdominal pain and nausea, but no vomiting.  She is quite a poor historian.  Pain seemingly upper abdomen/epigastrium.  Denies vomiting.  She does have a documented history of alcohol use disorder and polysubstance abuse.  She last drank alcohol yesterday.   Emesis      Home Medications Prior to Admission medications   Medication Sig Start Date End Date Taking? Authorizing Provider  acetaminophen (TYLENOL) 325 MG tablet Take 650 mg by mouth every 6 (six) hours as needed for mild pain.    [provider]  albuterol (VENTOLIN HFA) 108 (90 Base) MCG/ACT inhaler Inhale 1-2 puffs into the lungs every 6 (six) hours as needed for wheezing or shortness of breath. 02/25/23   Sabas Sous, MD  albuterol (VENTOLIN HFA) 108 (90 Base) MCG/ACT inhaler Inhale 1-2 puffs into the lungs every 6 (six) hours as needed for wheezing or shortness of breath. 03/18/23   Palumbo, April, MD  ARIPiprazole (ABILIFY) 10 MG tablet Take 1 tablet (10 mg total) by mouth daily. Follow up with psychiatry outpatient for medication refills and adjustments 03/16/23 04/15/23  Zigmund Daniel., MD  budesonide-formoterol [facility]) 160-4.5 MCG/ACT inhaler Inhale 2 puffs into the lungs 2 (two) times daily. 02/25/23   Sabas Sous, MD  carbamazepine (TEGRETOL) 200 MG tablet 800mg  PO QD X 1D, then 600mg  PO QD X 1D, then 400mg  QD X 1D, then 200mg  PO QD X 2D 03/18/23   Palumbo, April, MD  cyanocobalamin (VITAMIN B12) 1000 MCG tablet Take 1 tablet (1,000 mcg total) by mouth daily. 03/16/23 07/24/23  Zigmund Daniel., MD  diclofenac Sodium (VOLTAREN) 1 % GEL Apply 2 g topically 4 (four) times daily. Patient not taking: Reported on 03/07/2023 02/19/23   [provider]  famotidine (PEPCID) 20 MG tablet Take 1 tablet (20 mg total) by mouth daily. Patient not taking: Reported on 03/07/2023 02/25/23   Sabas Sous, MD  ferrous sulfate 325 (65 FE) MG tablet Take 1 tablet (325 mg total) by mouth every other day. 03/18/23 10/02/23  Zigmund Daniel., MD  folic acid (FOLVITE) 1 MG tablet Take 1 tablet (1 mg total) by mouth daily. 03/17/23 05/16/23  Zigmund Daniel., MD  furosemide (LASIX) 20 MG tablet Take 1 tablet (20 mg total) by mouth daily. Patient not taking: Reported on 03/07/2023 02/24/23   Achille Rich, PA-C  gabapentin (NEURONTIN) 100 MG capsule Take 100 mg by mouth 3 (three) times daily. Patient not taking: Reported on 03/07/2023 02/19/23   [provider]  hyoscyamine (LEVSIN/SL) 0.125 MG SL tablet Place 1 tablet (0.125 mg total) under the tongue every 4 (four) hours as needed (pain). Up to 1.25 mg daily Patient not taking: Reported on 02/22/2023 01/23/23   Arthor Captain, PA-C  nicotine (NICODERM CQ - DOSED IN MG/24 HOURS) 21 mg/24hr patch Place 1 patch (21 mg total) onto the skin daily. 03/16/23   Zigmund Daniel., MD  nicotine polacrilex (NICORETTE) 2 MG gum Take 1 each (2 mg total) by mouth as needed for smoking cessation. 03/16/23  Zigmund Daniel., MD  ondansetron (ZOFRAN) 4 MG tablet Take 1 tablet (4 mg total) by mouth every 6 (six) hours. Patient not taking: Reported on 02/22/2023 01/20/23   Prosperi, Christian H, PA-C  ondansetron (ZOFRAN-ODT) 4 MG disintegrating tablet Take 1 tablet (4 mg total) by mouth every 8 (eight) hours as needed for nausea or vomiting. Patient not taking: Reported on 03/07/2023 03/01/23   Laurence Spates, MD  OXYGEN Inhale 4-5 L into the lungs as needed (COPD).    [provider]  pantoprazole (PROTONIX) 40 MG tablet Take 1  tablet (40 mg total) by mouth daily. 03/17/23 04/16/23  Zigmund Daniel., MD  sucralfate (CARAFATE) 1 g tablet Take 1 tablet (1 g total) by mouth 4 (four) times daily -  with meals and at bedtime. Patient not taking: Reported on 02/22/2023 02/11/23   Horton, Mayer Masker, MD  tiZANidine (ZANAFLEX) 2 MG tablet Take 2 mg by mouth at bedtime. Patient not taking: Reported on 03/07/2023 02/19/23   [provider]  zonisamide (ZONEGRAN) 100 MG capsule Take 2 capsules (200 mg total) by mouth daily. Patient not taking: Reported on 02/22/2023 01/12/23   Lewie Chamber, MD      Allergies    Iron dextran, Aspirin, and Penicillins    Review of Systems   Review of Systems  Gastrointestinal:  Positive for vomiting.    Physical Exam Updated Vital Signs BP 108/70   Pulse 98   Temp 98.9 F (37.2 C) (Oral)   Resp 20   Ht 5\' 2"  (1.575 m)   Wt 59 kg   LMP 01/08/2011   SpO2 98%   BMI 23.79 kg/m  Physical Exam Vitals and nursing note reviewed.  Constitutional:      General: She is not in acute distress.    Appearance: She is diaphoretic. She is not toxic-appearing.  HENT:     Head: Normocephalic.     Mouth/Throat:     Mouth: Mucous membranes are moist.  Cardiovascular:     Rate and Rhythm: Normal rate and regular rhythm.  Pulmonary:     Effort: Pulmonary effort is normal.     Breath sounds: Normal breath sounds.  Abdominal:     General: Abdomen is flat. There is no distension.     Tenderness: There is abdominal tenderness (mild diffuse). There is no guarding or rebound.  Musculoskeletal:        General: Normal range of motion.  Skin:    Capillary Refill: Capillary refill takes less than 2 seconds.  Neurological:     Mental Status: She is alert and oriented to person, place, and time.  Psychiatric:        Behavior: Behavior normal.     ED Results / Procedures / Treatments   Labs (all labs ordered are listed, but only abnormal results are displayed) Labs Reviewed   COMPREHENSIVE METABOLIC PANEL - Abnormal; Notable for the following components:      Result Value   CO2 19 (*)    Glucose, Bld 192 (*)    Creatinine, Ser 0.33 (*)    Calcium 8.6 (*)    All other components within normal limits  CBC - Abnormal; Notable for the following components:   RBC 3.57 (*)    Hemoglobin 8.6 (*)    HCT 29.7 (*)    MCH 24.1 (*)    MCHC 29.0 (*)    RDW 22.0 (*)    All other components within normal limits  LIPASE, BLOOD  EKG EKG Interpretation Date/Time:  Wednesday March 18 2023 09:51:19 EDT Ventricular Rate:  113 PR Interval:  144 QRS Duration:  87 QT Interval:  332 QTC Calculation: 456 R Axis:   30  Text Interpretation: Sinus tachycardia Low voltage, precordial leads Confirmed by Estanislado Pandy [id number]) on 03/18/2023 10:58:23 AM  Radiology CT ABDOMEN PELVIS W CONTRAST  Result Date: 03/18/2023 CLINICAL DATA:  Bowel obstruction suspected. Vomiting. Abdominal pain. EXAM: CT ABDOMEN AND PELVIS WITH CONTRAST TECHNIQUE: Multidetector CT imaging of the abdomen and pelvis was performed using the standard protocol following bolus administration of intravenous contrast. RADIATION DOSE REDUCTION: This exam was performed according to the departmental dose-optimization program which includes automated exposure control, adjustment of the mA and/or kV according to patient size and/or use of iterative reconstruction technique. CONTRAST:  OMNIPAQUE IOHEXOL 300 MG/ML  SOLN COMPARISON:  03/01/2023 FINDINGS: Lower chest: No acute abnormality Hepatobiliary: Tiny subcentimeter low-attenuation area noted posteriorly in the right hepatic lobe is stable, difficult to characterize due to its small size but likely small cyst. No suspicious focal hepatic abnormality. Gallbladder unremarkable. Pancreas: No focal abnormality or ductal dilatation. Spleen: No focal abnormality.  Normal size. Adrenals/Urinary Tract: No adrenal abnormality. No focal renal abnormality. No stones or  hydronephrosis. Urinary bladder is unremarkable. Stomach/Bowel: Prior gastric bypass. No complicating feature. No bowel obstruction. Appendix normal. Vascular/Lymphatic: Aortic atherosclerosis. No evidence of aneurysm or adenopathy. Reproductive: Lower pelvic structures obscured by beam hardening artifact from right hip replacement. No visible pelvic mass. Other: No free fluid or free air. Musculoskeletal: No acute bony abnormality. IMPRESSION: Prior gastric bypass. No complicating feature or evidence of bowel obstruction. Aortic atherosclerosis. No acute findings. Electronically Signed   By: Charlett Nose M.D.   On: 03/18/2023 10:56   DG Chest Portable 1 View  Result Date: 03/18/2023 CLINICAL DATA:  Weakness and shortness of breath. EXAM: PORTABLE CHEST 1 VIEW COMPARISON:  February 25, 2023 FINDINGS: The heart size and mediastinal contours are within normal limits. Low lung volumes are noted. There is no evidence of an acute infiltrate, pleural effusion or pneumothorax. The visualized skeletal structures are unremarkable. IMPRESSION: Low lung volumes without acute or active cardiopulmonary disease. Electronically Signed   By: Aram Candela M.D.   On: 03/18/2023 03:46    Procedures Procedures    Medications Ordered in ED Medications  LORazepam (ATIVAN) injection 0-4 mg (2 mg Intravenous Given 03/18/23 0958)    Or  LORazepam (ATIVAN) tablet 0-4 mg ( Oral See Alternative 03/18/23 0958)  LORazepam (ATIVAN) injection 0-4 mg (has no administration in time range)    Or  LORazepam (ATIVAN) tablet 0-4 mg (has no administration in time range)  thiamine (VITAMIN B1) tablet 100 mg ( Oral See Alternative 03/18/23 1028)    Or  thiamine (VITAMIN B1) injection 100 mg (100 mg Intravenous Given 03/18/23 1028)  ondansetron (ZOFRAN) injection 4 mg (4 mg Intravenous Given 03/18/23 0913)  iohexol (OMNIPAQUE) 300 MG/ML solution 100 mL (100 mLs Intravenous Contrast Given 03/18/23 0931)  lactated ringers bolus 1,000 mL  (0 mLs Intravenous Stopped 03/18/23 1219)    ED Course/ Medical Decision Making/ A&P Clinical Course as of 03/18/23 1314  Wed Mar 18, 2023  1059 CT ABDOMEN PELVIS W CONTRAST IMPRESSION: Prior gastric bypass. No complicating feature or evidence of bowel obstruction.  Aortic atherosclerosis.  No acute findings.   [TY]  1209 Heart rate in the low 100s now after IV fluids.  Tolerating p.o.  Ambulatory to the bathroom.  No leukocytosis to suggest systemic  infection.  Stable chronic anemia.  No significant metabolic derangement her comprehensive panel.  Lipase not elevated.  CT abdomen with no acute findings.  Patient seemingly appeared to be in mild alcohol withdrawal as she is a daily drinker last drink was yesterday evening was placed on CIWA and given Ativan.  Feel that she is stable for discharge at this time. [TY]    Clinical Course User Index [TY] Coral Spikes, DO                                 Medical Decision Making 57 year old female present emergency department for abdominal pain.  Tachycardic, hypertensive.  She is slightly diaphoretic on exam and does have some tremors.  Question mild alcohol withdrawal.  CIWA ordered.  Patient does have significant abdominal surgeries, does have some mild tenderness on exam.  Will repeat labs and get CT scan of the abdomen to evaluate for obstructive pathology.  See ED course for final MDM disposition.  Amount and/or Complexity of Data Reviewed External Data Reviewed:     Details: See ED course Labs: ordered. Radiology: ordered. Decision-making details documented in ED Course. ECG/medicine tests: ordered.  Risk OTC drugs. Prescription drug management. Diagnosis or treatment significantly limited by social determinants of health. Risk Details: Polysubstance abuse          Final Clinical Impression(s) / ED Diagnoses Final diagnoses:  Abdominal pain, unspecified abdominal location    Rx / DC Orders ED Discharge Orders      None         Coral Spikes, DO 03/18/23 1314

## 2023-03-18 NOTE — ED Provider Notes (Signed)
[place] EMERGENCY DEPARTMENT AT [facility] Provider Note   CSN: [id number] Arrival date & time: 03/18/23  0248     History  Chief Complaint  Patient presents with   Shortness of Breath    [name] is a 57 y.o. female.  The history is provided by the patient.  Shortness of Breath Severity:  Moderate Onset quality:  Gradual Duration: days. Timing:  Constant Progression:  Unchanged Chronicity:  Recurrent Context: not pollens and not URI   Relieved by:  Nothing Worsened by:  Nothing Ineffective treatments:  None tried Associated symptoms: no chest pain, no fever, no hemoptysis, no vomiting and no wheezing   Risk factors: no family hx of DVT   Patient with a h/o COPD and substance abuse and Bipolar disorder presents with left foot pain and ongoing SOB.  Left [facility] twice earlier in the day for same.  No CP.  No fevers. No leg pain.    Past Medical History:  Diagnosis Date   Abdominal pain    Accidental drug overdose [name] 2013   Anxiety    Atrial fibrillation (HCC) 09/29/11   converted spontaneously   Chronic back pain    Chronic knee pain    Chronic nausea    Chronic pain    COPD (chronic obstructive pulmonary disease) (HCC)    Depression    Diabetes mellitus    states her doctor took her off all DM meds in past month   Diabetic neuropathy (HCC)    Dyspnea    with exertion    GERD (gastroesophageal reflux disease)    Headache(784.0)    migraines    HTN (hypertension)    not on meds since in a year    Hyperlipidemia    Hypothyroidism    not on meds in a while    Mental disorder    Bipolar and schizophrenic   Nausea and vomiting 01/02/2023   Requires supplemental oxygen    as needed per patient    Schizophrenia (HCC)    Schizophrenia, acute (HCC) 11/13/2017   Tobacco abuse         Home Medications Prior to Admission medications   Medication Sig Start Date End Date Taking? Authorizing Provider  albuterol (VENTOLIN HFA) 108 (90 Base)  MCG/ACT inhaler Inhale 1-2 puffs into the lungs every 6 (six) hours as needed for wheezing or shortness of breath. 03/18/23  Yes Harjot Dibello, MD  carbamazepine (TEGRETOL) 200 MG tablet 800mg  PO QD X 1D, then 600mg  PO QD X 1D, then 400mg  QD X 1D, then 200mg  PO QD X 2D 03/18/23  Yes Jerad Dunlap, MD  acetaminophen (TYLENOL) 325 MG tablet Take 650 mg by mouth every 6 (six) hours as needed for mild pain.    [provider]  albuterol (VENTOLIN HFA) 108 (90 Base) MCG/ACT inhaler Inhale 1-2 puffs into the lungs every 6 (six) hours as needed for wheezing or shortness of breath. 02/25/23   Sabas Sous, MD  ARIPiprazole (ABILIFY) 10 MG tablet Take 1 tablet (10 mg total) by mouth daily. Follow up with psychiatry outpatient for medication refills and adjustments 03/16/23 04/15/23  Zigmund Daniel., MD  budesonide-formoterol [facility]) 160-4.5 MCG/ACT inhaler Inhale 2 puffs into the lungs 2 (two) times daily. 02/25/23   Sabas Sous, MD  cyanocobalamin (VITAMIN B12) 1000 MCG tablet Take 1 tablet (1,000 mcg total) by mouth daily. 03/16/23 07/24/23  Zigmund Daniel., MD  diclofenac Sodium (VOLTAREN) 1 % GEL Apply 2  g topically 4 (four) times daily. Patient not taking: Reported on 03/07/2023 02/19/23   [provider]  famotidine (PEPCID) 20 MG tablet Take 1 tablet (20 mg total) by mouth daily. Patient not taking: Reported on 03/07/2023 02/25/23   Sabas Sous, MD  ferrous sulfate 325 (65 FE) MG tablet Take 1 tablet (325 mg total) by mouth every other day. 03/18/23 10/02/23  Zigmund Daniel., MD  folic acid (FOLVITE) 1 MG tablet Take 1 tablet (1 mg total) by mouth daily. 03/17/23 05/16/23  Zigmund Daniel., MD  furosemide (LASIX) 20 MG tablet Take 1 tablet (20 mg total) by mouth daily. Patient not taking: Reported on 03/07/2023 02/24/23   Achille Rich, PA-C  gabapentin (NEURONTIN) 100 MG capsule Take 100 mg by mouth 3 (three) times daily. Patient not taking: Reported on  03/07/2023 02/19/23   [provider]  hyoscyamine (LEVSIN/SL) 0.125 MG SL tablet Place 1 tablet (0.125 mg total) under the tongue every 4 (four) hours as needed (pain). Up to 1.25 mg daily Patient not taking: Reported on 02/22/2023 01/23/23   Arthor Captain, PA-C  nicotine (NICODERM CQ - DOSED IN MG/24 HOURS) 21 mg/24hr patch Place 1 patch (21 mg total) onto the skin daily. 03/16/23   Zigmund Daniel., MD  nicotine polacrilex (NICORETTE) 2 MG gum Take 1 each (2 mg total) by mouth as needed for smoking cessation. 03/16/23   Zigmund Daniel., MD  ondansetron (ZOFRAN) 4 MG tablet Take 1 tablet (4 mg total) by mouth every 6 (six) hours. Patient not taking: Reported on 02/22/2023 01/20/23   Prosperi, Christian H, PA-C  ondansetron (ZOFRAN-ODT) 4 MG disintegrating tablet Take 1 tablet (4 mg total) by mouth every 8 (eight) hours as needed for nausea or vomiting. Patient not taking: Reported on 03/07/2023 03/01/23   Laurence Spates, MD  OXYGEN Inhale 4-5 L into the lungs as needed (COPD).    [provider]  pantoprazole (PROTONIX) 40 MG tablet Take 1 tablet (40 mg total) by mouth daily. 03/17/23 04/16/23  Zigmund Daniel., MD  sucralfate (CARAFATE) 1 g tablet Take 1 tablet (1 g total) by mouth 4 (four) times daily -  with meals and at bedtime. Patient not taking: Reported on 02/22/2023 02/11/23   Horton, Mayer Masker, MD  tiZANidine (ZANAFLEX) 2 MG tablet Take 2 mg by mouth at bedtime. Patient not taking: Reported on 03/07/2023 02/19/23   [provider]  zonisamide (ZONEGRAN) 100 MG capsule Take 2 capsules (200 mg total) by mouth daily. Patient not taking: Reported on 02/22/2023 01/12/23   Lewie Chamber, MD      Allergies    Iron dextran, Aspirin, and Penicillins    Review of Systems   Review of Systems  Constitutional:  Negative for fever.  HENT:  Negative for congestion.   Eyes:  Negative for redness.  Respiratory:  Positive for shortness of breath. Negative for  hemoptysis, wheezing and stridor.   Cardiovascular:  Negative for chest pain and leg swelling.  Gastrointestinal:  Negative for vomiting.  All other systems reviewed and are negative.   Physical Exam Updated Vital Signs BP (!) 145/67   Pulse (!) 120   Temp 98.3 F (36.8 C) (Oral)   Resp 18   Ht 5\' 2"  (1.575 m)   Wt 59 kg   LMP 01/08/2011   SpO2 97%   BMI 23.78 kg/m  Physical Exam Vitals and nursing note reviewed.  Constitutional:  General: She is not in acute distress.    Appearance: She is well-developed.  HENT:     Head: Normocephalic and atraumatic.     Nose: Nose normal.  Eyes:     Pupils: Pupils are equal, round, and reactive to light.  Cardiovascular:     Rate and Rhythm: Regular rhythm. Tachycardia present.     Pulses: Normal pulses.     Heart sounds: Normal heart sounds.  Pulmonary:     Effort: Pulmonary effort is normal. No respiratory distress.     Breath sounds: Normal breath sounds. No stridor. No wheezing, rhonchi or rales.  Abdominal:     General: Bowel sounds are normal. There is no distension.     Palpations: Abdomen is soft.     Tenderness: There is no abdominal tenderness. There is no guarding or rebound.  Musculoskeletal:        General: Normal range of motion.     Cervical back: Neck supple.  Skin:    General: Skin is warm and dry.     Capillary Refill: Capillary refill takes less than 2 seconds.     Findings: No erythema or rash.  Neurological:     General: No focal deficit present.     Deep Tendon Reflexes: Reflexes normal.  Psychiatric:        Mood and Affect: Mood is anxious.     ED Results / Procedures / Treatments   Labs (all labs ordered are listed, but only abnormal results are displayed) Results for orders placed or performed during the hospital encounter of 03/18/23  CBC with Differential  Result Value Ref Range   WBC 10.1 4.0 - 10.5 K/uL   RBC 3.64 (L) 3.87 - 5.11 MIL/uL   Hemoglobin 8.6 (L) 12.0 - 15.0 g/dL   HCT 82.9  (L) 56.2 - 46.0 %   MCV 77.7 (L) 80.0 - 100.0 fL   MCH 23.6 (L) 26.0 - 34.0 pg   MCHC 30.4 30.0 - 36.0 g/dL   RDW 13.0 (H) 86.5 - 78.4 %   Platelets 362 150 - 400 K/uL   nRBC 0.0 0.0 - 0.2 %   Neutrophils Relative % 82 %   Neutro Abs 8.3 (H) 1.7 - 7.7 K/uL   Lymphocytes Relative 11 %   Lymphs Abs 1.1 0.7 - 4.0 K/uL   Monocytes Relative 6 %   Monocytes Absolute 0.6 0.1 - 1.0 K/uL   Eosinophils Relative 0 %   Eosinophils Absolute 0.0 0.0 - 0.5 K/uL   Basophils Relative 1 %   Basophils Absolute 0.1 0.0 - 0.1 K/uL   Immature Granulocytes 0 %   Abs Immature Granulocytes 0.03 0.00 - 0.07 K/uL   Polychromasia PRESENT    Target Cells PRESENT    Ovalocytes PRESENT   I-stat chem 8, ED (not at [facility], [facility] or [facility])  Result Value Ref Range   Sodium 138 135 - 145 mmol/L   Potassium 4.1 3.5 - 5.1 mmol/L   Chloride 104 98 - 111 mmol/L   BUN 10 6 - 20 mg/dL   Creatinine, Ser 6.96 (L) 0.44 - 1.00 mg/dL   Glucose, Bld 295 (H) 70 - 99 mg/dL   Calcium, Ion 2.84 (L) 1.15 - 1.40 mmol/L   TCO2 24 22 - 32 mmol/L   Hemoglobin 10.5 (L) 12.0 - 15.0 g/dL   HCT 13.2 (L) 44.0 - 10.2 %  Troponin I (High Sensitivity)  Result Value Ref Range   Troponin I (High Sensitivity) 3 <18 ng/L  DG Chest Portable 1 View  Result Date: 03/18/2023 CLINICAL DATA:  Weakness and shortness of breath. EXAM: PORTABLE CHEST 1 VIEW COMPARISON:  February 25, 2023 FINDINGS: The heart size and mediastinal contours are within normal limits. Low lung volumes are noted. There is no evidence of an acute infiltrate, pleural effusion or pneumothorax. The visualized skeletal structures are unremarkable. IMPRESSION: Low lung volumes without acute or active cardiopulmonary disease. Electronically Signed   By: Aram Candela M.D.   On: 03/18/2023 03:46   CT ABDOMEN PELVIS W CONTRAST  Result Date: 03/01/2023 CLINICAL DATA:  Acute abdominal pain. EXAM: CT ABDOMEN AND PELVIS WITH CONTRAST TECHNIQUE: Multidetector CT imaging of the abdomen and  pelvis was performed using the standard protocol following bolus administration of intravenous contrast. RADIATION DOSE REDUCTION: This exam was performed according to the departmental dose-optimization program which includes automated exposure control, adjustment of the mA and/or kV according to patient size and/or use of iterative reconstruction technique. CONTRAST:  75mL OMNIPAQUE IOHEXOL 350 MG/ML SOLN COMPARISON:  01/22/2023 FINDINGS: Lower Chest: No acute findings. Hepatobiliary: No suspicious hepatic masses identified. Tiny sub-cm low-attenuation lesion in posterior right hepatic lobe remains stable, likely representing a tiny cyst. Gallbladder is unremarkable. No evidence of biliary ductal dilatation. Pancreas:  No mass or inflammatory changes. Spleen: Within normal limits in size and appearance. Adrenals/Urinary Tract: No suspicious masses identified. No evidence of ureteral calculi or hydronephrosis. 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 No evidence of obstruction, inflammatory process or abnormal fluid collections. Vascular/Lymphatic: No pathologically enlarged lymph nodes. No acute vascular findings. Reproductive: Poor visualization due to severe artifact from right hip prosthesis. Other:  None. Musculoskeletal:  No suspicious bone lesions identified. IMPRESSION: No acute findings or other significant abnormality. Electronically Signed   By: Danae Orleans M.D.   On: 03/01/2023 10:06   DG Chest 2 View  Result Date: 02/25/2023 CLINICAL DATA:  Chest pain, shortness of breath EXAM: CHEST - 2 VIEW COMPARISON:  02/23/2023 FINDINGS: Heart and mediastinal contours are within normal limits. No focal opacities or effusions. No acute bony abnormality. IMPRESSION: No active cardiopulmonary disease. Electronically Signed   By: Charlett Nose M.D.   On: 02/25/2023 00:17   DG Chest Portable 1 View  Result Date: 02/23/2023 CLINICAL DATA:  Chest pain. EXAM: PORTABLE CHEST 1 VIEW COMPARISON:   Radiograph yesterday FINDINGS: Lower lung volumes from yesterday. Improving patchy bilateral airspace opacities, mild residual in the left mid lower lung zone. No new or progressive consolidation. No pulmonary edema, pleural effusion or pneumothorax. Stable osseous structures. IMPRESSION: Lower lung volumes from yesterday. Improving patchy bilateral airspace opacities, mild residual in the left mid lower lung zone. Electronically Signed   By: Narda Rutherford M.D.   On: 02/23/2023 22:21   DG THORACOLUMABAR SPINE  Result Date: 02/22/2023 CLINICAL DATA:  Back pain worse with motion and supine positioning EXAM: THORACOLUMBAR SPINE 1V COMPARISON:  08/07/2005, 02/14/2023 FINDINGS: Frontal and lateral views of the thoracolumbar spine are obtained, including T6 through L5. There are 5 non-rib-bearing lumbar type vertebral bodies. Alignment is grossly anatomic. There are stable anterior wedge compression deformities of T6 and T9. No new fractures are seen from the T6 through L5 levels. Mild spondylosis within the lower thoracic spine unchanged. Diffuse facet hypertrophic changes within the lumbar spine greatest at L4-5 and L5-S1. Paraspinal soft tissues are unremarkable. IMPRESSION: 1. Stable compression deformities at T6 and T9, unchanged since recent CT 02/14/2023. 2. Multilevel spondylosis and facet hypertrophy. 3. No new fractures. Electronically Signed  By: Sharlet Salina M.D.   On: 02/22/2023 17:09   DG Chest Port 1 View  Result Date: 02/22/2023 CLINICAL DATA:  chest pain and palpitations. EXAM: PORTABLE CHEST 1 VIEW COMPARISON:  02/20/2021 FINDINGS: Heart size and mediastinal contours are unremarkable. No pleural fluid or interstitial edema. New patchy opacities within the perihilar left mid lung, left lower lung and right base identified. Visualized osseous structures are unremarkable. IMPRESSION: New patchy opacities within the perihilar left mid lung, left lower lung and right base. Findings may reflect  areas of atelectasis or pneumonia. Electronically Signed   By: Signa Kell M.D.   On: 02/22/2023 06:48   DG Chest Portable 1 View  Result Date: 02/21/2023 CLINICAL DATA:  Shortness of breath.  Discharged earlier today. EXAM: PORTABLE CHEST 1 VIEW COMPARISON:  02/15/2023 FINDINGS: Numerous leads and wires project over the chest. Patient rotated minimally right. Midline trachea. Normal heart size. No pleural effusion or pneumothorax. Mild lower lung predominant interstitial thickening is likely related to smoking/chronic bronchitis. IMPRESSION: No acute cardiopulmonary disease. Electronically Signed   By: Jeronimo Greaves M.D.   On: 02/21/2023 18:54     EKG EKG Interpretation Date/Time:  Wednesday March 18 2023 03:19:02 EDT Ventricular Rate:  125 PR Interval:  128 QRS Duration:  91 QT Interval:  313 QTC Calculation: 452 R Axis:   45  Text Interpretation: Sinus tachycardia Confirmed by Jonette Wassel ([id number]) on 03/18/2023 3:20:53 AM  Radiology DG Chest Portable 1 View  Result Date: 03/18/2023 CLINICAL DATA:  Weakness and shortness of breath. EXAM: PORTABLE CHEST 1 VIEW COMPARISON:  February 25, 2023 FINDINGS: The heart size and mediastinal contours are within normal limits. Low lung volumes are noted. There is no evidence of an acute infiltrate, pleural effusion or pneumothorax. The visualized skeletal structures are unremarkable. IMPRESSION: Low lung volumes without acute or active cardiopulmonary disease. Electronically Signed   By: Aram Candela M.D.   On: 03/18/2023 03:46    Procedures Procedures    Medications Ordered in ED Medications  sodium chloride 0.9 % bolus 500 mL (500 mLs Intravenous New Bag/Given 03/18/23 0338)  haloperidol lactate (HALDOL) injection 5 mg (5 mg Intravenous Given 03/18/23 0339)  sodium chloride 0.9 % bolus 500 mL (500 mLs Intravenous New Bag/Given 03/18/23 0435)  ketorolac (TORADOL) 30 MG/ML injection 15 mg (15 mg Intravenous Given 03/18/23 0436)    ED  Course/ Medical Decision Making/ A&P                                 Medical Decision Making Patient with COPD seen earlier and left after nebulizer treatements   Amount and/or Complexity of Data Reviewed Independent Historian: EMS    Details: See above  External Data Reviewed: labs, ECG and notes. Labs: ordered.    Details: Negative covid and flu.  Normal whit count 10.1, hemoglobin 8.6 low but at baseline, normal platelets.   Normal troponin 3.  Normal sodium 138, normal potassium 4.1, low creatinine .4.   Radiology: ordered and independent interpretation performed.    Details: No PNA by me   Risk Prescription drug management. Risk Details: I do not believe this is a PE.  A lot of this is anxiety.  Given time course one EKG and troponin is sufficient to rule out ACS.  Heart score is 2, low risk for MACE.  I do not believe this is a PE.  Patient is markedly more comfortable with medications.  I have prescribed a new inhaler and tegretol taper as she has not had a drink in an unknown period of time.  Stable for discharge.  Strict return.    .mdm  Final Clinical Impression(s) / ED Diagnoses Final diagnoses:  Left foot pain   Return for intractable cough, coughing up blood, fevers > 100.4 unrelieved by medication, shortness of breath, intractable vomiting, chest pain, shortness of breath, weakness, numbness, changes in speech, facial asymmetry, abdominal pain, passing out, Inability to tolerate liquids or food, cough, altered mental status or any concerns. No signs of systemic illness or infection. The patient is nontoxic-appearing on exam and vital signs are within normal limits.  I have reviewed the triage vital signs and the nursing notes. Pertinent labs & imaging results that were available during my care of the patient were reviewed by me and considered in my medical decision making (see chart for details). After history, exam, and medical workup I feel the patient has been  appropriately medically screened and is safe for discharge home. Pertinent diagnoses were discussed with the patient. Patient was given return precautions.    Rx / DC Orders ED Discharge Orders          Ordered    carbamazepine (TEGRETOL) 200 MG tablet        03/18/23 0411    albuterol (VENTOLIN HFA) 108 (90 Base) MCG/ACT inhaler  Every 6 hours PRN        03/18/23 0437              Marlisa Caridi, MD 03/18/23 [id number]

## 2023-03-20 ENCOUNTER — Other Ambulatory Visit: Payer: Self-pay

## 2023-03-20 ENCOUNTER — Encounter (HOSPITAL_COMMUNITY): Payer: Self-pay

## 2023-03-20 ENCOUNTER — Emergency Department (HOSPITAL_COMMUNITY)
Admission: EM | Admit: 2023-03-20 | Discharge: 2023-03-20 | Disposition: A | Discharge status: Home / Self Care | Payer: 59 | Attending: Emergency Medicine | Admitting: Emergency Medicine

## 2023-03-20 DIAGNOSIS — R1013 Epigastric pain: Secondary | ICD-10-CM | POA: Insufficient documentation

## 2023-03-20 DIAGNOSIS — Z743 Need for continuous supervision: Secondary | ICD-10-CM | POA: Diagnosis not present

## 2023-03-20 DIAGNOSIS — J449 Chronic obstructive pulmonary disease, unspecified: Secondary | ICD-10-CM | POA: Insufficient documentation

## 2023-03-20 DIAGNOSIS — R109 Unspecified abdominal pain: Secondary | ICD-10-CM | POA: Diagnosis not present

## 2023-03-20 DIAGNOSIS — R112 Nausea with vomiting, unspecified: Secondary | ICD-10-CM | POA: Insufficient documentation

## 2023-03-20 DIAGNOSIS — I499 Cardiac arrhythmia, unspecified: Secondary | ICD-10-CM | POA: Diagnosis not present

## 2023-03-20 DIAGNOSIS — D649 Anemia, unspecified: Secondary | ICD-10-CM | POA: Diagnosis not present

## 2023-03-20 DIAGNOSIS — R000 Tachycardia, unspecified: Secondary | ICD-10-CM | POA: Diagnosis not present

## 2023-03-20 DIAGNOSIS — R110 Nausea: Secondary | ICD-10-CM | POA: Diagnosis not present

## 2023-03-20 DIAGNOSIS — Z79899 Other long term (current) drug therapy: Secondary | ICD-10-CM | POA: Diagnosis not present

## 2023-03-20 DIAGNOSIS — Z7951 Long term (current) use of inhaled steroids: Secondary | ICD-10-CM | POA: Insufficient documentation

## 2023-03-20 LAB — COMPREHENSIVE METABOLIC PANEL
ALT: 22 U/L (ref 0–44)
AST: 25 U/L (ref 15–41)
Albumin: 3.5 g/dL (ref 3.5–5.0)
Alkaline Phosphatase: 73 U/L (ref 38–126)
Anion gap: 9 (ref 5–15)
BUN: 8 mg/dL (ref 6–20)
CO2: 23 mmol/L (ref 22–32)
Calcium: 8.4 mg/dL — ABNORMAL LOW (ref 8.9–10.3)
Chloride: 100 mmol/L (ref 98–111)
Creatinine, Ser: 0.5 mg/dL (ref 0.44–1.00)
GFR, Estimated: >60 mL/min (ref >60)
Glucose, Bld: 135 mg/dL — ABNORMAL HIGH (ref 70–99)
Potassium: 3.7 mmol/L (ref 3.5–5.1)
Sodium: 132 mmol/L — ABNORMAL LOW (ref 135–145)
Total Bilirubin: 0.8 mg/dL (ref 0.3–1.2)
Total Protein: 7 g/dL (ref 6.5–8.1)

## 2023-03-20 LAB — ETHANOL: Alcohol, Ethyl (B): <10 mg/dL (ref <10)

## 2023-03-20 LAB — RAPID URINE DRUG SCREEN, HOSP PERFORMED
Amphetamines: NOT DETECTED
Barbiturates: NOT DETECTED
Benzodiazepines: NOT DETECTED
Cocaine: NOT DETECTED
Opiates: NOT DETECTED
Tetrahydrocannabinol: NOT DETECTED

## 2023-03-20 LAB — URINALYSIS, ROUTINE W REFLEX MICROSCOPIC
Bilirubin Urine: NEGATIVE
Glucose, UA: NEGATIVE mg/dL
Hgb urine dipstick: NEGATIVE
Ketones, ur: NEGATIVE mg/dL
Leukocytes,Ua: NEGATIVE
Nitrite: NEGATIVE
Protein, ur: NEGATIVE mg/dL
Specific Gravity, Urine: 1.004 — ABNORMAL LOW (ref 1.005–1.030)
pH: 6 (ref 5.0–8.0)

## 2023-03-20 LAB — CBC
HCT: 27.2 % — ABNORMAL LOW (ref 36.0–46.0)
Hemoglobin: 8.2 g/dL — ABNORMAL LOW (ref 12.0–15.0)
MCH: 23.8 pg — ABNORMAL LOW (ref 26.0–34.0)
MCHC: 30.1 g/dL (ref 30.0–36.0)
MCV: 78.8 fL — ABNORMAL LOW (ref 80.0–100.0)
Platelets: 337 10*3/uL (ref 150–400)
RBC: 3.45 MIL/uL — ABNORMAL LOW (ref 3.87–5.11)
RDW: 21.8 % — ABNORMAL HIGH (ref 11.5–15.5)
WBC: 7.3 10*3/uL (ref 4.0–10.5)
nRBC: 0.3 % — ABNORMAL HIGH (ref 0.0–0.2)

## 2023-03-20 LAB — ACETAMINOPHEN LEVEL: Acetaminophen (Tylenol), Serum: 11 ug/mL (ref 10–30)

## 2023-03-20 LAB — SALICYLATE LEVEL: Salicylate Lvl: <7 mg/dL — ABNORMAL LOW (ref 7.0–30.0)

## 2023-03-20 LAB — LIPASE, BLOOD: Lipase: 36 U/L (ref 11–51)

## 2023-03-20 MED ORDER — LORAZEPAM 2 MG/ML IJ SOLN
1.0000 mg | Freq: Once | INTRAMUSCULAR | Status: AC
  Administered 2023-03-20: 1 mg via INTRAVENOUS
  Filled 2023-03-20: qty 1

## 2023-03-20 MED ORDER — ALUM & MAG HYDROXIDE-SIMETH 200-200-20 MG/5ML PO SUSP
30.0000 mL | Freq: Once | ORAL | Status: AC
  Administered 2023-03-20: 30 mL via ORAL
  Filled 2023-03-20: qty 30

## 2023-03-20 MED ORDER — LIDOCAINE VISCOUS HCL 2 % MT SOLN
15.0000 mL | Freq: Once | OROMUCOSAL | Status: AC
  Administered 2023-03-20: 15 mL via ORAL
  Filled 2023-03-20: qty 15

## 2023-03-20 MED ORDER — LACTATED RINGERS IV BOLUS
1000.0000 mL | Freq: Once | INTRAVENOUS | Status: AC
  Administered 2023-03-20: 1000 mL via INTRAVENOUS

## 2023-03-20 MED ORDER — ONDANSETRON HCL 4 MG/2ML IJ SOLN
4.0000 mg | Freq: Once | INTRAMUSCULAR | Status: AC
  Administered 2023-03-20: 4 mg via INTRAVENOUS
  Filled 2023-03-20: qty 2

## 2023-03-20 NOTE — ED Triage Notes (Signed)
Patient brought in by EMS due to generalized abdominal pain. Has been seen here the past few days for the same. No N/V/D noted. States nothing has changed since last visit.

## 2023-03-20 NOTE — ED Provider Notes (Signed)
[place] EMERGENCY DEPARTMENT AT [facility] Provider Note   CSN: [id number] Arrival date & time: 03/20/23  0725     History  Chief Complaint  Patient presents with   Abdominal Pain    [name] is a 57 y.o. female with past medical history significant for alcohol use disorder, COPD, schizoaffective disorder, polysubstance abuse who presents concern for generalized abdominal pain, nausea, vomiting, patient has been seen multiple times for the last several days for similar.  CT and lab work from 2 days ago unremarkable.  Patient reports the pain is slightly worse since then.   Abdominal Pain      Home Medications Prior to Admission medications   Medication Sig Start Date End Date Taking? Authorizing Provider  acetaminophen (TYLENOL) 325 MG tablet Take 650 mg by mouth every 6 (six) hours as needed for mild pain.    [provider]  albuterol (VENTOLIN HFA) 108 (90 Base) MCG/ACT inhaler Inhale 1-2 puffs into the lungs every 6 (six) hours as needed for wheezing or shortness of breath. 02/25/23   Sabas Sous, MD  albuterol (VENTOLIN HFA) 108 (90 Base) MCG/ACT inhaler Inhale 1-2 puffs into the lungs every 6 (six) hours as needed for wheezing or shortness of breath. 03/18/23   Palumbo, April, MD  ARIPiprazole (ABILIFY) 10 MG tablet Take 1 tablet (10 mg total) by mouth daily. Follow up with psychiatry outpatient for medication refills and adjustments 03/16/23 04/15/23  Zigmund Daniel., MD  budesonide-formoterol [facility]) 160-4.5 MCG/ACT inhaler Inhale 2 puffs into the lungs 2 (two) times daily. 02/25/23   Sabas Sous, MD  carbamazepine (TEGRETOL) 200 MG tablet 800mg  PO QD X 1D, then 600mg  PO QD X 1D, then 400mg  QD X 1D, then 200mg  PO QD X 2D 03/18/23   Palumbo, April, MD  cyanocobalamin (VITAMIN B12) 1000 MCG tablet Take 1 tablet (1,000 mcg total) by mouth daily. 03/16/23 07/24/23  Zigmund Daniel., MD  diclofenac Sodium (VOLTAREN) 1 % GEL Apply 2 g  topically 4 (four) times daily. Patient not taking: Reported on 03/07/2023 02/19/23   [provider]  famotidine (PEPCID) 20 MG tablet Take 1 tablet (20 mg total) by mouth daily. Patient not taking: Reported on 03/07/2023 02/25/23   Sabas Sous, MD  ferrous sulfate 325 (65 FE) MG tablet Take 1 tablet (325 mg total) by mouth every other day. 03/18/23 10/02/23  Zigmund Daniel., MD  folic acid (FOLVITE) 1 MG tablet Take 1 tablet (1 mg total) by mouth daily. 03/17/23 05/16/23  Zigmund Daniel., MD  furosemide (LASIX) 20 MG tablet Take 1 tablet (20 mg total) by mouth daily. Patient not taking: Reported on 03/07/2023 02/24/23   Achille Rich, PA-C  gabapentin (NEURONTIN) 100 MG capsule Take 100 mg by mouth 3 (three) times daily. Patient not taking: Reported on 03/07/2023 02/19/23   [provider]  hyoscyamine (LEVSIN/SL) 0.125 MG SL tablet Place 1 tablet (0.125 mg total) under the tongue every 4 (four) hours as needed (pain). Up to 1.25 mg daily Patient not taking: Reported on 02/22/2023 01/23/23   Arthor Captain, PA-C  nicotine (NICODERM CQ - DOSED IN MG/24 HOURS) 21 mg/24hr patch Place 1 patch (21 mg total) onto the skin daily. 03/16/23   Zigmund Daniel., MD  nicotine polacrilex (NICORETTE) 2 MG gum Take 1 each (2 mg total) by mouth as needed for smoking cessation. 03/16/23   Zigmund Daniel., MD  ondansetron [facility]) 4  MG tablet Take 1 tablet (4 mg total) by mouth every 6 (six) hours. Patient not taking: Reported on 02/22/2023 01/20/23   Chizuko Trine H, PA-C  ondansetron (ZOFRAN-ODT) 4 MG disintegrating tablet Take 1 tablet (4 mg total) by mouth every 8 (eight) hours as needed for nausea or vomiting. Patient not taking: Reported on 03/07/2023 03/01/23   Laurence Spates, MD  OXYGEN Inhale 4-5 L into the lungs as needed (COPD).    [provider]  pantoprazole (PROTONIX) 40 MG tablet Take 1 tablet (40 mg total) by mouth daily. 03/17/23 04/16/23  Zigmund Daniel., MD  sucralfate (CARAFATE) 1 g tablet Take 1 tablet (1 g total) by mouth 4 (four) times daily -  with meals and at bedtime. Patient not taking: Reported on 02/22/2023 02/11/23   Horton, Mayer Masker, MD  tiZANidine (ZANAFLEX) 2 MG tablet Take 2 mg by mouth at bedtime. Patient not taking: Reported on 03/07/2023 02/19/23   [provider]  zonisamide (ZONEGRAN) 100 MG capsule Take 2 capsules (200 mg total) by mouth daily. Patient not taking: Reported on 02/22/2023 01/12/23   Lewie Chamber, MD      Allergies    Iron dextran, Aspirin, and Penicillins    Review of Systems   Review of Systems  Gastrointestinal:  Positive for abdominal pain.    Physical Exam Updated Vital Signs BP (!) 140/75   Pulse (!) 111   Temp 98.8 F (37.1 C) (Oral)   Resp 16   Ht 5\' 2"  (1.575 m)   Wt 59 kg   LMP 01/08/2011   SpO2 97%   BMI 23.79 kg/m  Physical Exam Vitals and nursing note reviewed.  Constitutional:      General: She is not in acute distress.    Appearance: Normal appearance.  HENT:     Head: Normocephalic and atraumatic.  Eyes:     General:        Right eye: No discharge.        Left eye: No discharge.  Cardiovascular:     Rate and Rhythm: Regular rhythm. Tachycardia present.     Heart sounds: No murmur heard.    No friction rub. No gallop.     Comments: Some tachycardia on arrival with normal rhythm Pulmonary:     Effort: Pulmonary effort is normal.     Breath sounds: Normal breath sounds.  Abdominal:     General: Bowel sounds are normal.     Palpations: Abdomen is soft.     Comments: Patient with diffuse abdominal tenderness, no rebound, rigidity, guarding, no right upper quadrant tenderness, no distention.  Skin:    General: Skin is warm and dry.     Capillary Refill: Capillary refill takes less than 2 seconds.  Neurological:     Mental Status: She is alert and oriented to person, place, and time.  Psychiatric:        Mood and Affect: Mood normal.         Behavior: Behavior normal.     ED Results / Procedures / Treatments   Labs (all labs ordered are listed, but only abnormal results are displayed) Labs Reviewed  URINALYSIS, ROUTINE W REFLEX MICROSCOPIC - Abnormal; Notable for the following components:      Result Value   Color, Urine STRAW (*)    Specific Gravity, Urine 1.004 (*)    All other components within normal limits  SALICYLATE LEVEL - Abnormal; Notable for the following components:   Salicylate Lvl <7.0 (*)  All other components within normal limits  CBC - Abnormal; Notable for the following components:   RBC 3.45 (*)    Hemoglobin 8.2 (*)    HCT 27.2 (*)    MCV 78.8 (*)    MCH 23.8 (*)    RDW 21.8 (*)    nRBC 0.3 (*)    All other components within normal limits  COMPREHENSIVE METABOLIC PANEL - Abnormal; Notable for the following components:   Sodium 132 (*)    Glucose, Bld 135 (*)    Calcium 8.4 (*)    All other components within normal limits  ETHANOL  ACETAMINOPHEN LEVEL  RAPID URINE DRUG SCREEN, HOSP PERFORMED  LIPASE, BLOOD    EKG None  Radiology No results found.  Procedures Ultrasound ED Peripheral IV (Provider)  Date/Time: 03/20/2023 8:56 AM  Performed by: Olene Floss, PA-C Authorized by: Olene Floss, PA-C   Procedure details:    Indications: hydration and multiple failed IV attempts     Skin Prep: isopropyl alcohol     Location:  Right AC   Angiocath:  18 G   Bedside Ultrasound Guided: Yes     Images: not archived     Patient tolerated procedure without complications: Yes     Dressing applied: No       Medications Ordered in ED Medications  ondansetron (ZOFRAN) injection 4 mg (4 mg Intravenous Given 03/20/23 0825)  lactated ringers bolus 1,000 mL (1,000 mLs Intravenous New Bag/Given 03/20/23 0834)  alum & mag hydroxide-simeth (MAALOX/MYLANTA) 200-200-20 MG/5ML suspension 30 mL (30 mLs Oral Given 03/20/23 0825)    And  lidocaine (XYLOCAINE) 2 % viscous mouth  solution 15 mL (15 mLs Oral Given 03/20/23 0825)  LORazepam (ATIVAN) injection 1 mg (1 mg Intravenous Given 03/20/23 1610)    ED Course/ Medical Decision Making/ A&P                                 Medical Decision Making Amount and/or Complexity of Data Reviewed Labs: ordered.  Risk OTC drugs. Prescription drug management.   This patient is a 57 y.o. female  who presents to the ED for concern of abdominal pain, nausea, vomiting.   Differential diagnoses prior to evaluation: The emergent differential diagnosis includes, but is not limited to,  The causes of generalized abdominal pain include but are not limited to AAA, mesenteric ischemia, appendicitis, diverticulitis, DKA, gastritis, gastroenteritis, AMI, nephrolithiasis, pancreatitis, peritonitis, adrenal insufficiency,lead poisoning, iron toxicity, intestinal ischemia, constipation, UTI,SBO/LBO, splenic rupture, biliary disease, IBD, IBS, PUD, or hepatitis --high suspicion for acute on chronic abdominal pain versus alcoholic gastritis given her history of EtOH abuse, multiple frequent visits for alcohol abuse and withdrawal. This is not an exhaustive differential.   Past Medical History / Co-morbidities / Social History: Schizoaffective disorder, alcohol use disorder, COPD, diabetes, polysubstance abuse, recurrent nausea, vomiting, abdominal pain  Additional history: Chart reviewed. Pertinent results include: Reviewed lab work, imaging from multiple recent emergency department visits  Physical Exam: Physical exam performed. The pertinent findings include: Diffuse tenderness palpation throughout the abdomen, no rebound, rigidity, guarding, most focally in epigastric region overall, no right upper quadrant tenderness.  Lab Tests/Imaging studies: I personally interpreted labs/imaging and the pertinent results include: CMP notable for mild hyponatremia, sodium 132, CBC with slight worsening of chronic anemia, hemoglobin 8.2.  Normal  acetaminophen, ethanol, salicylate levels.  UA overall unremarkable, UDS normal.  Lipase normal..    Medications: I  ordered medication including Ativan, GI cocktail, Zofran, fluid bolus.  I have reviewed the patients home medicines and have made adjustments as needed.   Disposition: After consideration of the diagnostic results and the patients response to treatment, I feel that patient is able for discharge, suspicious for alcoholic gastritis, no evidence of acute delirium tremens at this time, tachycardia improved after fluids.   emergency department workup does not suggest an emergent condition requiring admission or immediate intervention beyond what has been performed at this time. The plan is: as above. The patient is safe for discharge and has been instructed to return immediately for worsening symptoms, change in symptoms or any other concerns. } Final Clinical Impression(s) / ED Diagnoses Final diagnoses:  Epigastric pain  Nausea and vomiting, unspecified vomiting type    Rx / DC Orders ED Discharge Orders     None         Olene Floss, PA-C 03/20/23 1103    Derwood Kaplan, MD 03/22/23 1139

## 2023-03-21 ENCOUNTER — Emergency Department (HOSPITAL_COMMUNITY)
Admission: EM | Admit: 2023-03-21 | Discharge: 2023-03-21 | Discharge status: Against Medical Advice | Payer: 59 | Attending: Emergency Medicine | Admitting: Emergency Medicine

## 2023-03-21 ENCOUNTER — Encounter (HOSPITAL_COMMUNITY): Payer: Self-pay

## 2023-03-21 ENCOUNTER — Emergency Department (HOSPITAL_COMMUNITY): Payer: 59

## 2023-03-21 DIAGNOSIS — Z743 Need for continuous supervision: Secondary | ICD-10-CM | POA: Diagnosis not present

## 2023-03-21 DIAGNOSIS — R000 Tachycardia, unspecified: Secondary | ICD-10-CM | POA: Diagnosis not present

## 2023-03-21 DIAGNOSIS — R0602 Shortness of breath: Secondary | ICD-10-CM | POA: Diagnosis not present

## 2023-03-21 DIAGNOSIS — E119 Type 2 diabetes mellitus without complications: Secondary | ICD-10-CM | POA: Diagnosis not present

## 2023-03-21 DIAGNOSIS — Z20822 Contact with and (suspected) exposure to covid-19: Secondary | ICD-10-CM | POA: Insufficient documentation

## 2023-03-21 DIAGNOSIS — J449 Chronic obstructive pulmonary disease, unspecified: Secondary | ICD-10-CM | POA: Diagnosis not present

## 2023-03-21 DIAGNOSIS — I499 Cardiac arrhythmia, unspecified: Secondary | ICD-10-CM | POA: Diagnosis not present

## 2023-03-21 LAB — COMPREHENSIVE METABOLIC PANEL
ALT: 23 U/L (ref 0–44)
AST: 27 U/L (ref 15–41)
Albumin: 3.6 g/dL (ref 3.5–5.0)
Alkaline Phosphatase: 74 U/L (ref 38–126)
Anion gap: 10 (ref 5–15)
BUN: 7 mg/dL (ref 6–20)
CO2: 20 mmol/L — ABNORMAL LOW (ref 22–32)
Calcium: 8.5 mg/dL — ABNORMAL LOW (ref 8.9–10.3)
Chloride: 103 mmol/L (ref 98–111)
Creatinine, Ser: 0.47 mg/dL (ref 0.44–1.00)
GFR, Estimated: >60 mL/min (ref >60)
Glucose, Bld: 188 mg/dL — ABNORMAL HIGH (ref 70–99)
Potassium: 3.8 mmol/L (ref 3.5–5.1)
Sodium: 133 mmol/L — ABNORMAL LOW (ref 135–145)
Total Bilirubin: 0.6 mg/dL (ref 0.3–1.2)
Total Protein: 7.3 g/dL (ref 6.5–8.1)

## 2023-03-21 LAB — CBC
HCT: 29.1 % — ABNORMAL LOW (ref 36.0–46.0)
Hemoglobin: 8.6 g/dL — ABNORMAL LOW (ref 12.0–15.0)
MCH: 23.6 pg — ABNORMAL LOW (ref 26.0–34.0)
MCHC: 29.6 g/dL — ABNORMAL LOW (ref 30.0–36.0)
MCV: 79.7 fL — ABNORMAL LOW (ref 80.0–100.0)
Platelets: 334 10*3/uL (ref 150–400)
RBC: 3.65 MIL/uL — ABNORMAL LOW (ref 3.87–5.11)
RDW: 21.4 % — ABNORMAL HIGH (ref 11.5–15.5)
WBC: 6.5 10*3/uL (ref 4.0–10.5)
nRBC: 0 % (ref 0.0–0.2)

## 2023-03-21 MED ORDER — IPRATROPIUM-ALBUTEROL 0.5-2.5 (3) MG/3ML IN SOLN
3.0000 mL | Freq: Once | RESPIRATORY_TRACT | Status: AC
  Administered 2023-03-21: 3 mL via RESPIRATORY_TRACT
  Filled 2023-03-21: qty 3

## 2023-03-21 NOTE — ED Notes (Signed)
Patient walked out of department stating she did not want to stay and wait any longer. Patient was told she would be leaving against medical advice if she chose to leave now. Patient educated on dangers of leaving against medical advice. Christian, PA notified.

## 2023-03-21 NOTE — ED Triage Notes (Signed)
BIB EMS/ pt c/o SHOB/ seen for same yesterday/ family requesting resources for pt/ pt wears 2L O2 PRN/ 96% on 2L/ 148/86/ 120HR/ pt is A&Ox4/ pt has hx of schizo-affective/ non compliance/ hx of ETOH, pt denies at this time

## 2023-03-21 NOTE — ED Provider Notes (Signed)
[place] EMERGENCY DEPARTMENT AT [facility] Provider Note   CSN: [id number] Arrival date & time: 03/21/23  6578     History  Chief Complaint  Patient presents with   Shortness of Breath    [name] is a 57 y.o. female past medical history significant for schizoaffective disorder, alcohol use disorder, COPD, paroxysmal A-fib with RVR, diabetes, polysubstance abuse, frequent emergency department visits, intermittent supplemental oxygen use who presents with concern for ongoing epigastric abdominal pain, unchanged from multiple evaluations over the last week, with endorsement of new shortness of breath compared to her baseline.  Patient cannot recall in great detail being here yesterday, asks me what she is being seen for, and then reports that she is here for the same.  She denies any chest pain, she reports the shortness of breath is not significantly worse with exertion.   Shortness of Breath      Home Medications Prior to Admission medications   Medication Sig Start Date End Date Taking? Authorizing Provider  acetaminophen (TYLENOL) 325 MG tablet Take 650 mg by mouth every 6 (six) hours as needed for mild pain.    [provider]  albuterol (VENTOLIN HFA) 108 (90 Base) MCG/ACT inhaler Inhale 1-2 puffs into the lungs every 6 (six) hours as needed for wheezing or shortness of breath. 02/25/23   Sabas Sous, MD  albuterol (VENTOLIN HFA) 108 (90 Base) MCG/ACT inhaler Inhale 1-2 puffs into the lungs every 6 (six) hours as needed for wheezing or shortness of breath. 03/18/23   Palumbo, April, MD  ARIPiprazole (ABILIFY) 10 MG tablet Take 1 tablet (10 mg total) by mouth daily. Follow up with psychiatry outpatient for medication refills and adjustments 03/16/23 04/15/23  Zigmund Daniel., MD  budesonide-formoterol [facility]) 160-4.5 MCG/ACT inhaler Inhale 2 puffs into the lungs 2 (two) times daily. 02/25/23   Sabas Sous, MD  carbamazepine (TEGRETOL) 200  MG tablet 800mg  PO QD X 1D, then 600mg  PO QD X 1D, then 400mg  QD X 1D, then 200mg  PO QD X 2D 03/18/23   Palumbo, April, MD  cyanocobalamin (VITAMIN B12) 1000 MCG tablet Take 1 tablet (1,000 mcg total) by mouth daily. 03/16/23 07/24/23  Zigmund Daniel., MD  diclofenac Sodium (VOLTAREN) 1 % GEL Apply 2 g topically 4 (four) times daily. Patient not taking: Reported on 03/07/2023 02/19/23   [provider]  famotidine (PEPCID) 20 MG tablet Take 1 tablet (20 mg total) by mouth daily. Patient not taking: Reported on 03/07/2023 02/25/23   Sabas Sous, MD  ferrous sulfate 325 (65 FE) MG tablet Take 1 tablet (325 mg total) by mouth every other day. 03/18/23 10/02/23  Zigmund Daniel., MD  folic acid (FOLVITE) 1 MG tablet Take 1 tablet (1 mg total) by mouth daily. 03/17/23 05/16/23  Zigmund Daniel., MD  furosemide (LASIX) 20 MG tablet Take 1 tablet (20 mg total) by mouth daily. Patient not taking: Reported on 03/07/2023 02/24/23   Achille Rich, PA-C  gabapentin (NEURONTIN) 100 MG capsule Take 100 mg by mouth 3 (three) times daily. Patient not taking: Reported on 03/07/2023 02/19/23   [provider]  hyoscyamine (LEVSIN/SL) 0.125 MG SL tablet Place 1 tablet (0.125 mg total) under the tongue every 4 (four) hours as needed (pain). Up to 1.25 mg daily Patient not taking: Reported on 02/22/2023 01/23/23   Arthor Captain, PA-C  nicotine (NICODERM CQ - DOSED IN MG/24 HOURS) 21 mg/24hr patch Place 1 patch (21  mg total) onto the skin daily. 03/16/23   Zigmund Daniel., MD  nicotine polacrilex (NICORETTE) 2 MG gum Take 1 each (2 mg total) by mouth as needed for smoking cessation. 03/16/23   Zigmund Daniel., MD  ondansetron (ZOFRAN) 4 MG tablet Take 1 tablet (4 mg total) by mouth every 6 (six) hours. Patient not taking: Reported on 02/22/2023 01/20/23   Chazz Philson H, PA-C  ondansetron (ZOFRAN-ODT) 4 MG disintegrating tablet Take 1 tablet (4 mg total) by mouth every 8 (eight)  hours as needed for nausea or vomiting. Patient not taking: Reported on 03/07/2023 03/01/23   Laurence Spates, MD  OXYGEN Inhale 4-5 L into the lungs as needed (COPD).    [provider]  pantoprazole (PROTONIX) 40 MG tablet Take 1 tablet (40 mg total) by mouth daily. 03/17/23 04/16/23  Zigmund Daniel., MD  sucralfate (CARAFATE) 1 g tablet Take 1 tablet (1 g total) by mouth 4 (four) times daily -  with meals and at bedtime. Patient not taking: Reported on 02/22/2023 02/11/23   Horton, Mayer Masker, MD  tiZANidine (ZANAFLEX) 2 MG tablet Take 2 mg by mouth at bedtime. Patient not taking: Reported on 03/07/2023 02/19/23   [provider]  zonisamide (ZONEGRAN) 100 MG capsule Take 2 capsules (200 mg total) by mouth daily. Patient not taking: Reported on 02/22/2023 01/12/23   Lewie Chamber, MD      Allergies    Iron dextran, Aspirin, and Penicillins    Review of Systems   Review of Systems  Respiratory:  Positive for shortness of breath.   All other systems reviewed and are negative.   Physical Exam Updated Vital Signs BP (!) 149/88   Pulse (!) 123   Temp 97.7 F (36.5 C) (Oral)   Resp (!) 22   Wt 53.3 kg   LMP 01/08/2011   SpO2 97%   BMI 21.49 kg/m  Physical Exam Vitals and nursing note reviewed.  Constitutional:      General: She is not in acute distress.    Appearance: Normal appearance.  HENT:     Head: Normocephalic and atraumatic.  Eyes:     General:        Right eye: No discharge.        Left eye: No discharge.  Cardiovascular:     Rate and Rhythm: Regular rhythm. Tachycardia present.     Heart sounds: No murmur heard.    No friction rub. No gallop.  Pulmonary:     Effort: Pulmonary effort is normal.     Breath sounds: Normal breath sounds.     Comments: No wheezing, rhonchi, stridor, rales Abdominal:     General: Bowel sounds are normal.     Palpations: Abdomen is soft.     Comments: Diffuse tenderness throughout, no rebound, rigidity, guarding   Skin:    General: Skin is warm and dry.     Capillary Refill: Capillary refill takes less than 2 seconds.  Neurological:     Mental Status: She is alert and oriented to person, place, and time.  Psychiatric:        Mood and Affect: Mood normal.        Behavior: Behavior normal.     ED Results / Procedures / Treatments   Labs (all labs ordered are listed, but only abnormal results are displayed) Labs Reviewed  CBC - Abnormal; Notable for the following components:      Result Value   RBC 3.65 (*)  Hemoglobin 8.6 (*)    HCT 29.1 (*)    MCV 79.7 (*)    MCH 23.6 (*)    MCHC 29.6 (*)    RDW 21.4 (*)    All other components within normal limits  COMPREHENSIVE METABOLIC PANEL - Abnormal; Notable for the following components:   Sodium 133 (*)    CO2 20 (*)    Glucose, Bld 188 (*)    Calcium 8.5 (*)    All other components within normal limits    EKG EKG Interpretation Date/Time:  Saturday March 21 2023 06:55:10 EDT Ventricular Rate:  116 PR Interval:  134 QRS Duration:  91 QT Interval:  309 QTC Calculation: 430 R Axis:   14  Text Interpretation: Sinus tachycardia LAE, consider biatrial enlargement Abnormal ECG Confirmed by Anders Simmonds [phone]) on 03/21/2023 7:12:30 AM  Radiology DG Chest 2 View  Result Date: 03/21/2023 CLINICAL DATA:  57 year old female with shortness of breath for 3 days. EXAM: CHEST - 2 VIEW COMPARISON:  Portable chest 03/18/2023 and earlier. FINDINGS: PA and lateral views at 0719 hours. Larger lung volumes, improved lung base ventilation. Normal cardiac size and mediastinal contours. Visualized tracheal air column is within normal limits. No pneumothorax, pulmonary edema, pleural effusion or confluent lung opacity. No acute osseous abnormality identified. Negative visible bowel gas. IMPRESSION: Improved lung volumes and ventilation since 03/18/2023. No acute cardiopulmonary abnormality. Electronically Signed   By: Odessa Fleming M.D.   On: 03/21/2023  07:33    Procedures Procedures    Medications Ordered in ED Medications  ipratropium-albuterol (DUONEB) 0.5-2.5 (3) MG/3ML nebulizer solution 3 mL (3 mLs Nebulization Given 03/21/23 0813)    ED Course/ Medical Decision Making/ A&P                                 Medical Decision Making  This patient is a 57 y.o. female  who presents to the ED for concern of shortness of breath.   Differential diagnoses prior to evaluation: The emergent differential diagnosis includes, but is not limited to,  asthma exacerbation, COPD exacerbation, acute upper respiratory infection, acute bronchitis, chronic bronchitis, interstitial lung disease, ARDS, PE, pneumonia, atypical ACS, carbon monoxide poisoning, spontaneous pneumothorax, new CHF vs CHF exacerbation, versus other . This is not an exhaustive differential.   Past Medical History / Co-morbidities / Social History: schizoaffective disorder, alcohol use disorder, COPD, paroxysmal A-fib with RVR, diabetes, polysubstance abuse, frequent emergency department visits, intermittent supplemental oxygen use  Additional history: Chart reviewed. Pertinent results include: Extensively reviewed lab work, imaging from recent emergency department visits, patient with greater than 40 emergency department visits in the last 6 months  Physical Exam: Physical exam performed. The pertinent findings include: Patient with no wheezing, rhonchi, stridor, rales, she has some tachycardia with normal rhythm on initial evaluation, may be slightly increased secondary to albuterol given at home and on arrival, she is some diffuse tenderness to palpation of the abdomen, no rebound, rigidity, guarding.  Lab Tests/Imaging studies:  I personally interpreted labs/imaging and the pertinent results include:  CBC notable for anemia, stable compared to baseline.  CMP with mild hyponatremia, sodium 133, otherwise unremarkable..  Independently interpreted plain film chest x-ray which  shows no evidence of acute intrathoracic abnormality I agree with the radiologist interpretation.  Cardiac monitoring: EKG obtained and interpreted by myself and attending physician which shows: Sinus tachycardia   Medications: I ordered medication including DuoNeb for shortness of  breath.  I have reviewed the patients home medicines and have made adjustments as needed.   This patient eloped from the emergency department prior to reevaluation or discussion of her lab work, imaging findings.  Overall with no evidence of toxic or septic appearance on arrival, with greater than 40 ED visits within the last 6 months, as well as formal evaluation yesterday with completion of care patient seems stable to leave at this time.  She has decision-making capabilities.  Instructed to return for any worsening symptoms. Final Clinical Impression(s) / ED Diagnoses Final diagnoses:  None    Rx / DC Orders ED Discharge Orders     None         Olene Floss, PA-C 03/21/23 0848    Anders Simmonds T, DO 03/22/23 [phone]

## 2023-03-22 ENCOUNTER — Other Ambulatory Visit: Payer: Self-pay

## 2023-03-22 ENCOUNTER — Emergency Department (HOSPITAL_COMMUNITY)
Admission: EM | Admit: 2023-03-22 | Discharge: 2023-03-22 | Discharge status: Against Medical Advice | Payer: 59 | Attending: Emergency Medicine | Admitting: Emergency Medicine

## 2023-03-22 ENCOUNTER — Encounter (HOSPITAL_COMMUNITY): Payer: Self-pay

## 2023-03-22 ENCOUNTER — Encounter (HOSPITAL_COMMUNITY): Payer: Self-pay | Admitting: Emergency Medicine

## 2023-03-22 DIAGNOSIS — Z743 Need for continuous supervision: Secondary | ICD-10-CM | POA: Diagnosis not present

## 2023-03-22 DIAGNOSIS — E1140 Type 2 diabetes mellitus with diabetic neuropathy, unspecified: Secondary | ICD-10-CM | POA: Insufficient documentation

## 2023-03-22 DIAGNOSIS — J449 Chronic obstructive pulmonary disease, unspecified: Secondary | ICD-10-CM | POA: Insufficient documentation

## 2023-03-22 DIAGNOSIS — F17210 Nicotine dependence, cigarettes, uncomplicated: Secondary | ICD-10-CM | POA: Diagnosis not present

## 2023-03-22 DIAGNOSIS — F419 Anxiety disorder, unspecified: Secondary | ICD-10-CM | POA: Insufficient documentation

## 2023-03-22 DIAGNOSIS — E1165 Type 2 diabetes mellitus with hyperglycemia: Secondary | ICD-10-CM | POA: Insufficient documentation

## 2023-03-22 DIAGNOSIS — R0602 Shortness of breath: Secondary | ICD-10-CM | POA: Insufficient documentation

## 2023-03-22 DIAGNOSIS — I499 Cardiac arrhythmia, unspecified: Secondary | ICD-10-CM | POA: Diagnosis not present

## 2023-03-22 DIAGNOSIS — I10 Essential (primary) hypertension: Secondary | ICD-10-CM | POA: Insufficient documentation

## 2023-03-22 DIAGNOSIS — Z7951 Long term (current) use of inhaled steroids: Secondary | ICD-10-CM | POA: Diagnosis not present

## 2023-03-22 DIAGNOSIS — Z5321 Procedure and treatment not carried out due to patient leaving prior to being seen by health care provider: Secondary | ICD-10-CM | POA: Insufficient documentation

## 2023-03-22 DIAGNOSIS — E039 Hypothyroidism, unspecified: Secondary | ICD-10-CM | POA: Diagnosis not present

## 2023-03-22 LAB — URINALYSIS, W/ REFLEX TO CULTURE (INFECTION SUSPECTED)
Bacteria, UA: NONE SEEN
Bilirubin Urine: NEGATIVE
Glucose, UA: 150 mg/dL — AB
Hgb urine dipstick: NEGATIVE
Ketones, ur: NEGATIVE mg/dL
Leukocytes,Ua: NEGATIVE
Nitrite: NEGATIVE
Protein, ur: NEGATIVE mg/dL
Specific Gravity, Urine: 1.014 (ref 1.005–1.030)
pH: 5 (ref 5.0–8.0)

## 2023-03-22 MED ORDER — IPRATROPIUM-ALBUTEROL 0.5-2.5 (3) MG/3ML IN SOLN
3.0000 mL | Freq: Once | RESPIRATORY_TRACT | Status: AC
  Administered 2023-03-22: 3 mL via RESPIRATORY_TRACT
  Filled 2023-03-22: qty 3

## 2023-03-22 NOTE — ED Triage Notes (Signed)
Pt here for anxiety. Just left the ER for similar issues. No new complaints.

## 2023-03-22 NOTE — ED Triage Notes (Signed)
Pt BIB by Ems from home. c/o of SHOB. Husband called today. Seen yesterday for similar symptoms. Having back and stomach pain. Wears 4L oxygen prn. hx of schizo-affective / denies ETOH today.

## 2023-03-22 NOTE — ED Notes (Signed)
Pt is constantly ambulating back and forth to rr 3 times so far

## 2023-03-22 NOTE — ED Provider Notes (Signed)
[place] EMERGENCY DEPARTMENT AT [facility] Provider Note  CSN: [id number] Arrival date & time: 03/22/23 4098  Chief Complaint(s) Shortness of Breath  HPI [name] is a 57 y.o. female with a past medical history listed below including schizophrenic disorder, COPD on 4 L as needed supplemental oxygen at home here for several days of shortness of breath.  Patient reports using frequent albuterol puffs.  Still feels short of breath.  She denies any coughing or congestion.  No fevers or chills.  No chest pain.  States that she feels like she can get a full breath in.  Reports that she still smoking a pack a day.  The history is provided by the patient.    Past Medical History Past Medical History:  Diagnosis Date   Abdominal pain    Accidental drug overdose April 2013   Anxiety    Atrial fibrillation (HCC) 09/29/11   converted spontaneously   Chronic back pain    Chronic knee pain    Chronic nausea    Chronic pain    COPD (chronic obstructive pulmonary disease) (HCC)    Depression    Diabetes mellitus    states her doctor took her off all DM meds in past month   Diabetic neuropathy (HCC)    Dyspnea    with exertion    GERD (gastroesophageal reflux disease)    Headache(784.0)    migraines    HTN (hypertension)    not on meds since in a year    Hyperlipidemia    Hypothyroidism    not on meds in a while    Mental disorder    Bipolar and schizophrenic   Nausea and vomiting 01/02/2023   Requires supplemental oxygen    as needed per patient    Schizophrenia (HCC)    Schizophrenia, acute (HCC) 11/13/2017   Tobacco abuse    Patient Active Problem List   Diagnosis Date Noted   Acute on chronic anemia 03/13/2023   B12 deficiency 03/13/2023   Hyponatremia 02/22/2023   Chronic alcohol use 02/22/2023   Continuous dependence on cigarette smoking 02/22/2023   Alcohol use disorder, severe, dependence (HCC) 01/03/2023   Suicidal ideation 01/03/2023   Abnormal  weight loss 01/02/2023   Nausea and vomiting 01/02/2023   Hyperglycemia due to type 2 diabetes mellitus (HCC) 01/02/2023   Personal history of colonic polyps 01/02/2023   Acute on chronic respiratory failure with hypoxia (HCC) 12/11/2022   Pulmonary nodules 12/11/2022   Type 2 diabetes mellitus without complications (HCC) 12/11/2022   Malnutrition of moderate degree 11/22/2022   Withdrawal symptoms, alcohol (HCC) 11/20/2022   CAP (community acquired pneumonia) 11/20/2022   Closed fracture of right zygomaticomaxillary complex (HCC) 08/26/2022   Blunt trauma 08/26/2022   Alcohol withdrawal (HCC) 08/25/2022   Seizure-like activity (HCC) 08/23/2022   Cocaine abuse (HCC) 08/23/2022   Closed fracture of left proximal humerus 07/08/2022   Homicidal ideation 07/07/2022   Aggressive behavior 07/05/2022   Hyperbilirubinemia 06/16/2022   Pressure injury of skin 06/16/2022   Pulmonary embolism (HCC) 05/19/2022   Postoperative wound infection 05/04/2022   Thrombocytosis 05/04/2022   Seizure (HCC) 04/17/2022   Memory loss 04/17/2022   SOB (shortness of breath) 04/05/2022   Abnormal EEG 02/12/2022   Constipation 02/12/2022   Vitamin A deficiency 02/12/2022   Vitamin D deficiency 02/12/2022   Vitamin C deficiency 02/12/2022   Iron deficiency anemia 02/12/2022   GERD (gastroesophageal reflux disease) 02/12/2022   Tobacco abuse 02/12/2022   Protein-calorie  malnutrition, severe 01/14/2022   Acute respiratory failure with hypoxia and hypercapnia (HCC) 11/14/2021   Anemia 11/14/2021   Alcohol abuse with intoxication (HCC) 11/12/2021   Dysphagia 05/28/2021   Nasal septal perforation 05/28/2021   Referred otalgia of left ear 05/28/2021   Oral pain 05/28/2021   Encephalopathy 10/10/2020   Polysubstance (excluding opioids) dependence, daily use (HCC) 08/04/2018   Substance induced mood disorder (HCC) 08/03/2018   Schizoaffective disorder, bipolar type (HCC) 11/13/2017   Polysubstance abuse (HCC)  11/13/2017   Type 2 diabetes mellitus (HCC) 09/06/2015   Right knee pain 01/05/2015   Muscular deconditioning 09/08/2014   Primary insomnia 09/08/2014   Encounter for monitoring opioid maintenance therapy 07/10/2014   Myofascial pain syndrome 12/09/2013   DDD (degenerative disc disease), lumbosacral 08/12/2013   Low back pain 08/12/2013   Lumbosacral spondylosis 04/13/2013   Primary localized osteoarthrosis, lower leg 12/14/2012   Chronic pain 11/27/2012   Hypothyroidism 11/27/2012   Sacroiliitis (HCC) 11/27/2012   Pain in joint involving lower leg 11/27/2012   Essential hypertension 11/04/2011   Paroxysmal atrial fibrillation with RVR (HCC) 09/30/2011   COPD (chronic obstructive pulmonary disease) (HCC) 09/29/2011   Home Medication(s) Prior to Admission medications   Medication Sig Start Date End Date Taking? Authorizing Provider  acetaminophen (TYLENOL) 325 MG tablet Take 650 mg by mouth every 6 (six) hours as needed for mild pain.    [provider]  albuterol (VENTOLIN HFA) 108 (90 Base) MCG/ACT inhaler Inhale 1-2 puffs into the lungs every 6 (six) hours as needed for wheezing or shortness of breath. 02/25/23   Sabas Sous, MD  albuterol (VENTOLIN HFA) 108 (90 Base) MCG/ACT inhaler Inhale 1-2 puffs into the lungs every 6 (six) hours as needed for wheezing or shortness of breath. 03/18/23   Palumbo, April, MD  ARIPiprazole (ABILIFY) 10 MG tablet Take 1 tablet (10 mg total) by mouth daily. Follow up with psychiatry outpatient for medication refills and adjustments 03/16/23 04/15/23  Zigmund Daniel., MD  budesonide-formoterol [facility]) 160-4.5 MCG/ACT inhaler Inhale 2 puffs into the lungs 2 (two) times daily. 02/25/23   Sabas Sous, MD  carbamazepine (TEGRETOL) 200 MG tablet 800mg  PO QD X 1D, then 600mg  PO QD X 1D, then 400mg  QD X 1D, then 200mg  PO QD X 2D 03/18/23   Palumbo, April, MD  cyanocobalamin (VITAMIN B12) 1000 MCG tablet Take 1 tablet (1,000 mcg total) by  mouth daily. 03/16/23 07/24/23  Zigmund Daniel., MD  diclofenac Sodium (VOLTAREN) 1 % GEL Apply 2 g topically 4 (four) times daily. Patient not taking: Reported on 03/07/2023 02/19/23   [provider]  famotidine (PEPCID) 20 MG tablet Take 1 tablet (20 mg total) by mouth daily. Patient not taking: Reported on 03/07/2023 02/25/23   Sabas Sous, MD  ferrous sulfate 325 (65 FE) MG tablet Take 1 tablet (325 mg total) by mouth every other day. 03/18/23 10/02/23  Zigmund Daniel., MD  folic acid (FOLVITE) 1 MG tablet Take 1 tablet (1 mg total) by mouth daily. 03/17/23 05/16/23  Zigmund Daniel., MD  furosemide (LASIX) 20 MG tablet Take 1 tablet (20 mg total) by mouth daily. Patient not taking: Reported on 03/07/2023 02/24/23   Achille Rich, PA-C  gabapentin (NEURONTIN) 100 MG capsule Take 100 mg by mouth 3 (three) times daily. Patient not taking: Reported on 03/07/2023 02/19/23   [provider]  hyoscyamine (LEVSIN/SL) 0.125 MG SL tablet Place 1 tablet (0.125 mg total) under the  tongue every 4 (four) hours as needed (pain). Up to 1.25 mg daily Patient not taking: Reported on 02/22/2023 01/23/23   Arthor Captain, PA-C  nicotine (NICODERM CQ - DOSED IN MG/24 HOURS) 21 mg/24hr patch Place 1 patch (21 mg total) onto the skin daily. 03/16/23   Zigmund Daniel., MD  nicotine polacrilex (NICORETTE) 2 MG gum Take 1 each (2 mg total) by mouth as needed for smoking cessation. 03/16/23   Zigmund Daniel., MD  ondansetron (ZOFRAN) 4 MG tablet Take 1 tablet (4 mg total) by mouth every 6 (six) hours. Patient not taking: Reported on 02/22/2023 01/20/23   Prosperi, Christian H, PA-C  ondansetron (ZOFRAN-ODT) 4 MG disintegrating tablet Take 1 tablet (4 mg total) by mouth every 8 (eight) hours as needed for nausea or vomiting. Patient not taking: Reported on 03/07/2023 03/01/23   Laurence Spates, MD  OXYGEN Inhale 4-5 L into the lungs as needed (COPD).    [provider]   pantoprazole (PROTONIX) 40 MG tablet Take 1 tablet (40 mg total) by mouth daily. 03/17/23 04/16/23  Zigmund Daniel., MD  sucralfate (CARAFATE) 1 g tablet Take 1 tablet (1 g total) by mouth 4 (four) times daily -  with meals and at bedtime. Patient not taking: Reported on 02/22/2023 02/11/23   Horton, Mayer Masker, MD  tiZANidine (ZANAFLEX) 2 MG tablet Take 2 mg by mouth at bedtime. Patient not taking: Reported on 03/07/2023 02/19/23   [provider]  zonisamide (ZONEGRAN) 100 MG capsule Take 2 capsules (200 mg total) by mouth daily. Patient not taking: Reported on 02/22/2023 01/12/23   Lewie Chamber, MD                                                                                                                                    Allergies Iron dextran, Aspirin, and Penicillins  Review of Systems Review of Systems As noted in HPI  Physical Exam Vital Signs  I have reviewed the triage vital signs BP (!) 148/79   Pulse (!) 102   Temp 98.3 F (36.8 C) (Oral)   Resp (!) 24   Ht 5\' 2"  (1.575 m)   Wt 53.3 kg   LMP 01/08/2011   SpO2 100%   BMI 21.49 kg/m   Physical Exam Vitals reviewed.  Constitutional:      General: She is not in acute distress.    Appearance: She is well-developed. She is not diaphoretic.  HENT:     Head: Normocephalic and atraumatic.     Nose: Nose normal.  Eyes:     General: No scleral icterus.       Right eye: No discharge.        Left eye: No discharge.     Conjunctiva/sclera: Conjunctivae normal.     Pupils: Pupils are equal, round, and reactive to light.  Cardiovascular:     Rate and Rhythm: Normal rate  and regular rhythm.     Heart sounds: No murmur heard.    No friction rub. No gallop.  Pulmonary:     Effort: Pulmonary effort is normal. Tachypnea present. No respiratory distress.     Breath sounds: Normal breath sounds. Decreased air movement present. No stridor. No wheezing, rhonchi or rales.  Abdominal:     General: There is no  distension.     Palpations: Abdomen is soft.     Tenderness: There is no abdominal tenderness.  Musculoskeletal:        General: No tenderness.     Cervical back: Normal range of motion and neck supple.  Skin:    General: Skin is warm and dry.     Findings: No erythema or rash.  Neurological:     Mental Status: She is alert and oriented to person, place, and time.     ED Results and Treatments Labs (all labs ordered are listed, but only abnormal results are displayed) Labs Reviewed  URINALYSIS, W/ REFLEX TO CULTURE (INFECTION SUSPECTED) - Abnormal; Notable for the following components:      Result Value   Glucose, UA 150 (*)    All other components within normal limits                                                                                                                         EKG  EKG Interpretation Date/Time:    Ventricular Rate:    PR Interval:    QRS Duration:    QT Interval:    QTC Calculation:   R Axis:      Text Interpretation:         Radiology No results found.  Medications Ordered in ED Medications  ipratropium-albuterol (DUONEB) 0.5-2.5 (3) MG/3ML nebulizer solution 3 mL (3 mLs Nebulization Given 03/22/23 0430)   Procedures Procedures  (including critical care time) Medical Decision Making / ED Course   Medical Decision Making Risk Prescription drug management.    Patient here for shortness of breath. Patient was here yesterday but left prior to be seen.  During that time she had chest x-ray obtained which was negative for any pneumonia, pneumothorax. Provided with DuoNeb resulting in improved air movement.  States that she feels much better. She was satting well on room air Ambulated to the bathroom numerous times without increased work of breathing Question about frequency which she admitted to. UA to assess for infection.  Patient eloped prior to UA resulting which was negative.     Final Clinical Impression(s) / ED  Diagnoses Final diagnoses:  SOB (shortness of breath)   This chart was dictated using voice recognition software.  Despite best efforts to proofread,  errors can occur which can change the documentation meaning.    Nira Conn, MD 03/23/23 0800

## 2023-03-22 NOTE — ED Notes (Signed)
Pt ambulated to bathroom 

## 2023-03-22 NOTE — ED Notes (Signed)
Pt back to rr stated she couldn't go before

## 2023-03-23 ENCOUNTER — Other Ambulatory Visit: Payer: Self-pay

## 2023-03-23 ENCOUNTER — Encounter (HOSPITAL_COMMUNITY): Payer: Self-pay

## 2023-03-23 ENCOUNTER — Emergency Department (HOSPITAL_COMMUNITY)
Admission: EM | Admit: 2023-03-23 | Discharge: 2023-03-23 | Disposition: A | Discharge status: Home / Self Care | Payer: 59 | Attending: Emergency Medicine | Admitting: Emergency Medicine

## 2023-03-23 DIAGNOSIS — Z743 Need for continuous supervision: Secondary | ICD-10-CM | POA: Diagnosis not present

## 2023-03-23 DIAGNOSIS — F409 Phobic anxiety disorder, unspecified: Secondary | ICD-10-CM | POA: Diagnosis not present

## 2023-03-23 DIAGNOSIS — R0602 Shortness of breath: Secondary | ICD-10-CM | POA: Diagnosis not present

## 2023-03-23 DIAGNOSIS — Z711 Person with feared health complaint in whom no diagnosis is made: Secondary | ICD-10-CM

## 2023-03-23 DIAGNOSIS — R6889 Other general symptoms and signs: Secondary | ICD-10-CM | POA: Diagnosis not present

## 2023-03-23 NOTE — Discharge Instructions (Signed)
Your albuterol inhaler was refilled on 03/18/23. Please pick this up from the pharmacy and use as prescribed.

## 2023-03-23 NOTE — ED Provider Notes (Signed)
[place] EMERGENCY DEPARTMENT AT [facility] Provider Note   CSN: [id number] Arrival date & time: 03/23/23  0203     History  Chief Complaint  Patient presents with   Shortness of Breath    [name] is a 57 y.o. female.  57 year old female brought in by EMS from home.  Patient states that her brother called the ambulance because he thought she was short of breath.  Patient denies any complaints although states that she could use a refill of her inhaler.       Home Medications Prior to Admission medications   Medication Sig Start Date End Date Taking? Authorizing Provider  acetaminophen (TYLENOL) 325 MG tablet Take 650 mg by mouth every 6 (six) hours as needed for mild pain.    [provider]  albuterol (VENTOLIN HFA) 108 (90 Base) MCG/ACT inhaler Inhale 1-2 puffs into the lungs every 6 (six) hours as needed for wheezing or shortness of breath. 02/25/23   Sabas Sous, MD  albuterol (VENTOLIN HFA) 108 (90 Base) MCG/ACT inhaler Inhale 1-2 puffs into the lungs every 6 (six) hours as needed for wheezing or shortness of breath. 03/18/23   Palumbo, April, MD  ARIPiprazole (ABILIFY) 10 MG tablet Take 1 tablet (10 mg total) by mouth daily. Follow up with psychiatry outpatient for medication refills and adjustments 03/16/23 04/15/23  Zigmund Daniel., MD  budesonide-formoterol [facility]) 160-4.5 MCG/ACT inhaler Inhale 2 puffs into the lungs 2 (two) times daily. 02/25/23   Sabas Sous, MD  carbamazepine (TEGRETOL) 200 MG tablet 800mg  PO QD X 1D, then 600mg  PO QD X 1D, then 400mg  QD X 1D, then 200mg  PO QD X 2D 03/18/23   Palumbo, April, MD  cyanocobalamin (VITAMIN B12) 1000 MCG tablet Take 1 tablet (1,000 mcg total) by mouth daily. 03/16/23 07/24/23  Zigmund Daniel., MD  diclofenac Sodium (VOLTAREN) 1 % GEL Apply 2 g topically 4 (four) times daily. Patient not taking: Reported on 03/07/2023 02/19/23   [provider]  famotidine (PEPCID) 20 MG  tablet Take 1 tablet (20 mg total) by mouth daily. Patient not taking: Reported on 03/07/2023 02/25/23   Sabas Sous, MD  ferrous sulfate 325 (65 FE) MG tablet Take 1 tablet (325 mg total) by mouth every other day. 03/18/23 10/02/23  Zigmund Daniel., MD  folic acid (FOLVITE) 1 MG tablet Take 1 tablet (1 mg total) by mouth daily. 03/17/23 05/16/23  Zigmund Daniel., MD  furosemide (LASIX) 20 MG tablet Take 1 tablet (20 mg total) by mouth daily. Patient not taking: Reported on 03/07/2023 02/24/23   Achille Rich, PA-C  gabapentin (NEURONTIN) 100 MG capsule Take 100 mg by mouth 3 (three) times daily. Patient not taking: Reported on 03/07/2023 02/19/23   [provider]  hyoscyamine (LEVSIN/SL) 0.125 MG SL tablet Place 1 tablet (0.125 mg total) under the tongue every 4 (four) hours as needed (pain). Up to 1.25 mg daily Patient not taking: Reported on 02/22/2023 01/23/23   Arthor Captain, PA-C  nicotine (NICODERM CQ - DOSED IN MG/24 HOURS) 21 mg/24hr patch Place 1 patch (21 mg total) onto the skin daily. 03/16/23   Zigmund Daniel., MD  nicotine polacrilex (NICORETTE) 2 MG gum Take 1 each (2 mg total) by mouth as needed for smoking cessation. 03/16/23   Zigmund Daniel., MD  ondansetron (ZOFRAN) 4 MG tablet Take 1 tablet (4 mg total) by mouth every 6 (six) hours. Patient not  taking: Reported on 02/22/2023 01/20/23   Prosperi, Christian H, PA-C  ondansetron (ZOFRAN-ODT) 4 MG disintegrating tablet Take 1 tablet (4 mg total) by mouth every 8 (eight) hours as needed for nausea or vomiting. Patient not taking: Reported on 03/07/2023 03/01/23   Laurence Spates, MD  OXYGEN Inhale 4-5 L into the lungs as needed (COPD).    [provider]  pantoprazole (PROTONIX) 40 MG tablet Take 1 tablet (40 mg total) by mouth daily. 03/17/23 04/16/23  Zigmund Daniel., MD  sucralfate (CARAFATE) 1 g tablet Take 1 tablet (1 g total) by mouth 4 (four) times daily -  with meals and at  bedtime. Patient not taking: Reported on 02/22/2023 02/11/23   Horton, Mayer Masker, MD  tiZANidine (ZANAFLEX) 2 MG tablet Take 2 mg by mouth at bedtime. Patient not taking: Reported on 03/07/2023 02/19/23   [provider]  zonisamide (ZONEGRAN) 100 MG capsule Take 2 capsules (200 mg total) by mouth daily. Patient not taking: Reported on 02/22/2023 01/12/23   Lewie Chamber, MD      Allergies    Iron dextran, Aspirin, and Penicillins    Review of Systems   Review of Systems Negative except as per HPI Physical Exam Updated Vital Signs BP 123/74   Pulse (!) 109   Temp 98.4 F (36.9 C) (Oral)   Resp (!) 21   LMP 01/08/2011   SpO2 96%  Physical Exam Vitals and nursing note reviewed.  Constitutional:      General: She is not in acute distress.    Appearance: She is well-developed. She is not diaphoretic.  HENT:     Head: Normocephalic and atraumatic.  Cardiovascular:     Rate and Rhythm: Regular rhythm. Tachycardia present.  Pulmonary:     Effort: Pulmonary effort is normal.     Breath sounds: Normal breath sounds. No wheezing.  Musculoskeletal:     Right lower leg: No edema.     Left lower leg: No edema.  Skin:    General: Skin is warm and dry.     Findings: No erythema.     Nails: There is no clubbing.  Neurological:     Mental Status: She is alert and oriented to person, place, and time.  Psychiatric:        Behavior: Behavior normal.     ED Results / Procedures / Treatments   Labs (all labs ordered are listed, but only abnormal results are displayed) Labs Reviewed - No data to display  EKG None  Radiology DG Chest 2 View  Result Date: 03/21/2023 CLINICAL DATA:  57 year old female with shortness of breath for 3 days. EXAM: CHEST - 2 VIEW COMPARISON:  Portable chest 03/18/2023 and earlier. FINDINGS: PA and lateral views at 0719 hours. Larger lung volumes, improved lung base ventilation. Normal cardiac size and mediastinal contours. Visualized tracheal air  column is within normal limits. No pneumothorax, pulmonary edema, pleural effusion or confluent lung opacity. No acute osseous abnormality identified. Negative visible bowel gas. IMPRESSION: Improved lung volumes and ventilation since 03/18/2023. No acute cardiopulmonary abnormality. Electronically Signed   By: Odessa Fleming M.D.   On: 03/21/2023 07:33    Procedures Procedures    Medications Ordered in ED Medications - No data to display  ED Course/ Medical Decision Making/ A&P                                 Medical Decision  Making  57 year old female arrives via EMS from home without complaint or concern.  Patient states that her brother told her she was short of breath and called an ambulance.  She states that she feels fine but would like her albuterol inhaler refilled.  Review of records, patient was seen on 03/18/2023 and refill of her inhaler was provided to her pharmacy.  Recommend that she pick this prescription up and use as prescribed.  Recheck with PCP. Also seen in this emergency room yesterday, chest x-ray reviewed and shown to have improved lung volumes.  Patient is ambulatory to the bathroom without increased work of breathing.        Final Clinical Impression(s) / ED Diagnoses Final diagnoses:  Feared complaint without diagnosis    Rx / DC Orders ED Discharge Orders     None         Jeannie Fend, PA-C 03/23/23 0305    Tilden Fossa, MD 03/23/23 [phone]

## 2023-03-23 NOTE — ED Triage Notes (Signed)
Pt arrived via [facility] c/o sob

## 2023-03-24 ENCOUNTER — Emergency Department (HOSPITAL_BASED_OUTPATIENT_CLINIC_OR_DEPARTMENT_OTHER)
Admission: EM | Admit: 2023-03-24 | Discharge: 2023-03-24 | Disposition: A | Discharge status: Home / Self Care | Payer: 59 | Source: Home / Self Care

## 2023-03-24 ENCOUNTER — Encounter (HOSPITAL_BASED_OUTPATIENT_CLINIC_OR_DEPARTMENT_OTHER): Payer: Self-pay

## 2023-03-24 ENCOUNTER — Emergency Department (HOSPITAL_BASED_OUTPATIENT_CLINIC_OR_DEPARTMENT_OTHER)
Admission: EM | Admit: 2023-03-24 | Discharge: 2023-03-24 | Discharge status: Against Medical Advice | Payer: 59 | Source: Home / Self Care

## 2023-03-24 ENCOUNTER — Emergency Department (HOSPITAL_BASED_OUTPATIENT_CLINIC_OR_DEPARTMENT_OTHER)
Admission: EM | Admit: 2023-03-24 | Discharge: 2023-03-24 | Disposition: A | Discharge status: Home / Self Care | Payer: 59 | Source: Home / Self Care | Attending: Emergency Medicine | Admitting: Emergency Medicine

## 2023-03-24 ENCOUNTER — Other Ambulatory Visit: Payer: Self-pay

## 2023-03-24 ENCOUNTER — Encounter (HOSPITAL_BASED_OUTPATIENT_CLINIC_OR_DEPARTMENT_OTHER): Payer: Self-pay | Admitting: Emergency Medicine

## 2023-03-24 ENCOUNTER — Emergency Department (HOSPITAL_BASED_OUTPATIENT_CLINIC_OR_DEPARTMENT_OTHER)
Admission: EM | Admit: 2023-03-24 | Discharge: 2023-03-24 | Discharge status: Against Medical Advice | Payer: 59 | Attending: Emergency Medicine | Admitting: Emergency Medicine

## 2023-03-24 DIAGNOSIS — R109 Unspecified abdominal pain: Secondary | ICD-10-CM | POA: Insufficient documentation

## 2023-03-24 DIAGNOSIS — Z0000 Encounter for general adult medical examination without abnormal findings: Secondary | ICD-10-CM | POA: Insufficient documentation

## 2023-03-24 DIAGNOSIS — D649 Anemia, unspecified: Secondary | ICD-10-CM | POA: Diagnosis not present

## 2023-03-24 DIAGNOSIS — R0602 Shortness of breath: Secondary | ICD-10-CM | POA: Insufficient documentation

## 2023-03-24 DIAGNOSIS — R6889 Other general symptoms and signs: Secondary | ICD-10-CM | POA: Diagnosis not present

## 2023-03-24 DIAGNOSIS — Z5321 Procedure and treatment not carried out due to patient leaving prior to being seen by health care provider: Secondary | ICD-10-CM | POA: Insufficient documentation

## 2023-03-24 DIAGNOSIS — W19XXXA Unspecified fall, initial encounter: Secondary | ICD-10-CM | POA: Insufficient documentation

## 2023-03-24 DIAGNOSIS — F17200 Nicotine dependence, unspecified, uncomplicated: Secondary | ICD-10-CM | POA: Insufficient documentation

## 2023-03-24 DIAGNOSIS — Z743 Need for continuous supervision: Secondary | ICD-10-CM | POA: Diagnosis not present

## 2023-03-24 MED ORDER — FERROUS SULFATE 325 (65 FE) MG PO TABS: 325.0000 mg | ORAL_TABLET | ORAL | Status: DC

## 2023-03-24 MED ORDER — ALBUTEROL SULFATE HFA 108 (90 BASE) MCG/ACT IN AERS
1.0000 | INHALATION_SPRAY | Freq: Once | RESPIRATORY_TRACT | Status: DC
  Filled 2023-03-24: qty 6.7

## 2023-03-24 MED ORDER — FOLIC ACID 1 MG PO TABS: 1.0000 mg | ORAL_TABLET | Freq: Every day | ORAL | Status: DC

## 2023-03-24 MED ORDER — FAMOTIDINE 20 MG PO TABS: 20.0000 mg | ORAL_TABLET | Freq: Every day | ORAL | Status: DC

## 2023-03-24 MED ORDER — NICOTINE 21 MG/24HR TD PT24: 21.0000 mg | MEDICATED_PATCH | Freq: Every day | TRANSDERMAL | Status: DC

## 2023-03-24 MED ORDER — ARIPIPRAZOLE 5 MG PO TABS: 10.0000 mg | ORAL_TABLET | Freq: Every day | ORAL | Status: DC

## 2023-03-24 MED ORDER — ACETAMINOPHEN 325 MG PO TABS: 650.0000 mg | ORAL_TABLET | Freq: Four times a day (QID) | ORAL | Status: DC | PRN

## 2023-03-24 MED ORDER — VITAMIN B-12 1000 MCG PO TABS: 1000.0000 ug | ORAL_TABLET | Freq: Every day | ORAL | Status: DC

## 2023-03-24 MED ORDER — ALBUTEROL SULFATE HFA 108 (90 BASE) MCG/ACT IN AERS: 1.0000 | INHALATION_SPRAY | Freq: Four times a day (QID) | RESPIRATORY_TRACT | Status: DC | PRN

## 2023-03-24 MED ORDER — MOMETASONE FURO-FORMOTEROL FUM 200-5 MCG/ACT IN AERO
2.0000 | INHALATION_SPRAY | Freq: Two times a day (BID) | RESPIRATORY_TRACT | Status: DC
Start: 2023-03-24 — End: 2023-03-24

## 2023-03-24 MED ORDER — ALBUTEROL SULFATE (2.5 MG/3ML) 0.083% IN NEBU: 2.5000 mg | INHALATION_SOLUTION | Freq: Four times a day (QID) | RESPIRATORY_TRACT | Status: DC | PRN

## 2023-03-24 NOTE — ED Triage Notes (Signed)
Pt returns for continued SOB after LWBS again

## 2023-03-24 NOTE — ED Notes (Signed)
Patient presents to ED for third time today c/o shortness of breath that started today at 0200 along with intermittent abdominal pain. Patient denies N/V/D. Patient LWBS twice today.

## 2023-03-24 NOTE — ED Notes (Signed)
Patient on 4LNC as needed at home. Patient received breathing treatment en route with EMS.

## 2023-03-24 NOTE — ED Notes (Signed)
Pt requesting d/c paperwork

## 2023-03-24 NOTE — ED Notes (Signed)
Pt. Called this RN to the room,stating she had to go to the bathroom. Pt. Unhooked from monitor and sat sensor, walked into the hall, stated," I don't have to go to the bathroom. I told pt. To go back to bed so I could hook her back up to the monitor. Pt. Said she didn't want to be hooked up, that she was leaving.

## 2023-03-24 NOTE — Discharge Instructions (Addendum)
Please call your primary care doctor today.  You can make an appointment.

## 2023-03-24 NOTE — ED Triage Notes (Signed)
Pt was lying in road in front of hospital, recently d/c'd, unwitnessed as to why pt was in road. Passerby alerted ED staff. Pt states she doesn't know her dob, denies being in the road, denies pain

## 2023-03-24 NOTE — ED Notes (Signed)
Patient ambulated to restroom with steady gait.

## 2023-03-24 NOTE — ED Notes (Signed)
Pt making multiple trips to the bathroom one after the other; continues to come out of room. Ambulatory, NAD noted at this time.

## 2023-03-24 NOTE — ED Notes (Signed)
Pt requested bandaid because she said her nail is broken. Band-Aid provided.

## 2023-03-24 NOTE — ED Notes (Signed)
Pt to be discharged by EDP. Pt ride on phone and pt decided leave without discharge paperwork. Pt ambulatory, in NAD, and CA&O at time of discharge.

## 2023-03-24 NOTE — Progress Notes (Signed)
A pedestrian came into the ER and said there was a lady laying in the road. Staff went out with a wheel chair and it was the Pt that had just been discharged. Pt was admitted again and a consult for psych was put in.

## 2023-03-24 NOTE — ED Notes (Signed)
Pt discharged to home using teachback Method. Discharge instructions have been discussed with patient and/or family members. Pt verbally acknowledges understanding d/c instructions, has been given opportunity for questions to be answered, and endorses comprehension to checkout at registration before leaving.  

## 2023-03-24 NOTE — ED Provider Notes (Signed)
[place] EMERGENCY DEPARTMENT AT [facility] Provider Note   CSN: [id number] Arrival date & time: 03/24/23  0759     History  Chief Complaint  Patient presents with   Marletta Lor    [name] is a 57 y.o. female.  57 year old female, just seen in the emergency department and discharge.  Went outside and sat down in the parking lot.  A bystander brought her back in.  Denies head strike or loss of consciousness.   Fall       Home Medications Prior to Admission medications   Medication Sig Start Date End Date Taking? Authorizing Provider  acetaminophen (TYLENOL) 325 MG tablet Take 650 mg by mouth every 6 (six) hours as needed for mild pain.    [provider]  albuterol (VENTOLIN HFA) 108 (90 Base) MCG/ACT inhaler Inhale 1-2 puffs into the lungs every 6 (six) hours as needed for wheezing or shortness of breath. 02/25/23   Sabas Sous, MD  albuterol (VENTOLIN HFA) 108 (90 Base) MCG/ACT inhaler Inhale 1-2 puffs into the lungs every 6 (six) hours as needed for wheezing or shortness of breath. 03/18/23   Palumbo, April, MD  ARIPiprazole (ABILIFY) 10 MG tablet Take 1 tablet (10 mg total) by mouth daily. Follow up with psychiatry outpatient for medication refills and adjustments 03/16/23 04/15/23  Zigmund Daniel., MD  budesonide-formoterol [facility]) 160-4.5 MCG/ACT inhaler Inhale 2 puffs into the lungs 2 (two) times daily. 02/25/23   Sabas Sous, MD  carbamazepine (TEGRETOL) 200 MG tablet 800mg  PO QD X 1D, then 600mg  PO QD X 1D, then 400mg  QD X 1D, then 200mg  PO QD X 2D 03/18/23   Palumbo, April, MD  cyanocobalamin (VITAMIN B12) 1000 MCG tablet Take 1 tablet (1,000 mcg total) by mouth daily. 03/16/23 07/24/23  Zigmund Daniel., MD  diclofenac Sodium (VOLTAREN) 1 % GEL Apply 2 g topically 4 (four) times daily. Patient not taking: Reported on 03/07/2023 02/19/23   [provider]  famotidine (PEPCID) 20 MG tablet Take 1 tablet (20 mg total) by mouth  daily. Patient not taking: Reported on 03/07/2023 02/25/23   Sabas Sous, MD  ferrous sulfate 325 (65 FE) MG tablet Take 1 tablet (325 mg total) by mouth every other day. 03/18/23 10/02/23  Zigmund Daniel., MD  folic acid (FOLVITE) 1 MG tablet Take 1 tablet (1 mg total) by mouth daily. 03/17/23 05/16/23  Zigmund Daniel., MD  furosemide (LASIX) 20 MG tablet Take 1 tablet (20 mg total) by mouth daily. Patient not taking: Reported on 03/07/2023 02/24/23   Achille Rich, PA-C  gabapentin (NEURONTIN) 100 MG capsule Take 100 mg by mouth 3 (three) times daily. Patient not taking: Reported on 03/07/2023 02/19/23   [provider]  hyoscyamine (LEVSIN/SL) 0.125 MG SL tablet Place 1 tablet (0.125 mg total) under the tongue every 4 (four) hours as needed (pain). Up to 1.25 mg daily Patient not taking: Reported on 02/22/2023 01/23/23   Arthor Captain, PA-C  nicotine (NICODERM CQ - DOSED IN MG/24 HOURS) 21 mg/24hr patch Place 1 patch (21 mg total) onto the skin daily. 03/16/23   Zigmund Daniel., MD  nicotine polacrilex (NICORETTE) 2 MG gum Take 1 each (2 mg total) by mouth as needed for smoking cessation. 03/16/23   Zigmund Daniel., MD  ondansetron (ZOFRAN) 4 MG tablet Take 1 tablet (4 mg total) by mouth every 6 (six) hours. Patient not taking: Reported on 02/22/2023 01/20/23  Prosperi, Christian H, PA-C  ondansetron (ZOFRAN-ODT) 4 MG disintegrating tablet Take 1 tablet (4 mg total) by mouth every 8 (eight) hours as needed for nausea or vomiting. Patient not taking: Reported on 03/07/2023 03/01/23   Laurence Spates, MD  OXYGEN Inhale 4-5 L into the lungs as needed (COPD).    [provider]  pantoprazole (PROTONIX) 40 MG tablet Take 1 tablet (40 mg total) by mouth daily. 03/17/23 04/16/23  Zigmund Daniel., MD  sucralfate (CARAFATE) 1 g tablet Take 1 tablet (1 g total) by mouth 4 (four) times daily -  with meals and at bedtime. Patient not taking: Reported on 02/22/2023  02/11/23   Horton, Mayer Masker, MD  tiZANidine (ZANAFLEX) 2 MG tablet Take 2 mg by mouth at bedtime. Patient not taking: Reported on 03/07/2023 02/19/23   [provider]  zonisamide (ZONEGRAN) 100 MG capsule Take 2 capsules (200 mg total) by mouth daily. Patient not taking: Reported on 02/22/2023 01/12/23   Lewie Chamber, MD      Allergies    Iron dextran, Aspirin, and Penicillins    Review of Systems   Review of Systems  Physical Exam Updated Vital Signs BP (!) 150/84   Pulse (!) 108   Resp 18   LMP 01/08/2011   SpO2 92%  Physical Exam Vitals reviewed.  Constitutional:      Appearance: Normal appearance.  HENT:     Head: Normocephalic and atraumatic.  Cardiovascular:     Rate and Rhythm: Normal rate.  Pulmonary:     Effort: Pulmonary effort is normal.  Musculoskeletal:        General: Normal range of motion.  Skin:    General: Skin is warm and dry.  Neurological:     General: No focal deficit present.     Mental Status: She is alert and oriented to person, place, and time.     Motor: No weakness.     Gait: Gait normal.     ED Results / Procedures / Treatments   Labs (all labs ordered are listed, but only abnormal results are displayed) Labs Reviewed  COMPREHENSIVE METABOLIC PANEL  ETHANOL  RAPID URINE DRUG SCREEN, HOSP PERFORMED  CBC WITH DIFFERENTIAL/PLATELET    EKG None  Radiology No results found.  Procedures Procedures    Medications Ordered in ED Medications  acetaminophen (TYLENOL) tablet 650 mg (has no administration in time range)  ARIPiprazole (ABILIFY) tablet 10 mg (has no administration in time range)  mometasone-formoterol (DULERA) 200-5 MCG/ACT inhaler 2 puff (has no administration in time range)  cyanocobalamin (VITAMIN B12) tablet 1,000 mcg (has no administration in time range)  famotidine (PEPCID) tablet 20 mg (has no administration in time range)  ferrous sulfate tablet 325 mg (has no administration in time range)  folic acid  (FOLVITE) tablet 1 mg (has no administration in time range)  nicotine (NICODERM CQ - dosed in mg/24 hours) patch 21 mg (has no administration in time range)  albuterol (PROVENTIL) (2.5 MG/3ML) 0.083% nebulizer solution 2.5 mg (has no administration in time range)    ED Course/ Medical Decision Making/ A&P                                 Medical Decision Making 57 year old female here today after she sat down in the parking lot and was brought in by a bystander.  Plan-with the patient's again repeated visits to the emergency department, as documented  in my note from 1 hour ago, will provide medical screening number for the patient to TTS.  At this time, I believe that this is a behavioral versus psychiatric problem.  Reassessment-8:18 AM.  Patient tells me now that she would like to go home.  She is not endorsing any SI or HI.  She is alert and oriented x 3.  She has a safe ride home, her daughter is called her neighbor.  I have no indication to forcibly keep this patient in the emergency department.  Amount and/or Complexity of Data Reviewed Labs: ordered.  Risk OTC drugs. Prescription drug management.           Final Clinical Impression(s) / ED Diagnoses Final diagnoses:  General medical exam    Rx / DC Orders ED Discharge Orders     None         Arletha Pili, DO 03/24/23 0818

## 2023-03-24 NOTE — ED Provider Notes (Signed)
[name] EMERGENCY DEPARTMENT AT [facility] Provider Note   CSN: [id number] Arrival date & time: 03/24/23  4010     History  Chief Complaint  Patient presents with   Shortness of Breath    [name] is a 57 y.o. female.  57 year old female here today because [name] wanted to come to the emergency department.  I spoke to the patient's daughter, who is a Child psychotherapist at home.  [name] tells me that the patient has anxiety, and often forgets that [name] has an inhaler at home, and has had 55 visits to the emergency department over the last 3 months.  Per the daughter, EMS got to the patient's home, [name] O2 saturation was 96% on room air.  [name] has home O2 as needed at home.  Patient tells me that [name] is not sure why [name] came to the emergency department but [name] was feeling anxious.  [name] is not feeling as anxious right now.  [name] knows that [name] has [name] inhaler at home.  I am going to give [name] 1 that [name] can put in [name] pocket today.   Shortness of Breath      Home Medications Prior to Admission medications   Medication Sig Start Date End Date Taking? Authorizing Provider  acetaminophen (TYLENOL) 325 MG tablet Take 650 mg by mouth every 6 (six) hours as needed for mild pain.    [provider]  albuterol (VENTOLIN HFA) 108 (90 Base) MCG/ACT inhaler Inhale 1-2 puffs into the lungs every 6 (six) hours as needed for wheezing or shortness of breath. 02/25/23   Sabas Sous, MD  albuterol (VENTOLIN HFA) 108 (90 Base) MCG/ACT inhaler Inhale 1-2 puffs into the lungs every 6 (six) hours as needed for wheezing or shortness of breath. 03/18/23   Palumbo, April, MD  ARIPiprazole (ABILIFY) 10 MG tablet Take 1 tablet (10 mg total) by mouth daily. Follow up with psychiatry outpatient for medication refills and adjustments 03/16/23 04/15/23  Zigmund Daniel., MD  budesonide-formoterol [facility]) 160-4.5 MCG/ACT inhaler Inhale 2 puffs into the lungs 2 (two) times daily. 02/25/23    Sabas Sous, MD  carbamazepine (TEGRETOL) 200 MG tablet 800mg  PO QD X 1D, then 600mg  PO QD X 1D, then 400mg  QD X 1D, then 200mg  PO QD X 2D 03/18/23   Palumbo, April, MD  cyanocobalamin (VITAMIN B12) 1000 MCG tablet Take 1 tablet (1,000 mcg total) by mouth daily. 03/16/23 07/24/23  Zigmund Daniel., MD  diclofenac Sodium (VOLTAREN) 1 % GEL Apply 2 g topically 4 (four) times daily. Patient not taking: Reported on 03/07/2023 02/19/23   [provider]  famotidine (PEPCID) 20 MG tablet Take 1 tablet (20 mg total) by mouth daily. Patient not taking: Reported on 03/07/2023 02/25/23   Sabas Sous, MD  ferrous sulfate 325 (65 FE) MG tablet Take 1 tablet (325 mg total) by mouth every other day. 03/18/23 10/02/23  Zigmund Daniel., MD  folic acid (FOLVITE) 1 MG tablet Take 1 tablet (1 mg total) by mouth daily. 03/17/23 05/16/23  Zigmund Daniel., MD  furosemide (LASIX) 20 MG tablet Take 1 tablet (20 mg total) by mouth daily. Patient not taking: Reported on 03/07/2023 02/24/23   Achille Rich, PA-C  gabapentin (NEURONTIN) 100 MG capsule Take 100 mg by mouth 3 (three) times daily. Patient not taking: Reported on 03/07/2023 02/19/23   [provider]  hyoscyamine (LEVSIN/SL) 0.125 MG SL tablet Place 1 tablet (0.125 mg total)  under the tongue every 4 (four) hours as needed (pain). Up to 1.25 mg daily Patient not taking: Reported on 02/22/2023 01/23/23   Arthor Captain, PA-C  nicotine (NICODERM CQ - DOSED IN MG/24 HOURS) 21 mg/24hr patch Place 1 patch (21 mg total) onto the skin daily. 03/16/23   Zigmund Daniel., MD  nicotine polacrilex (NICORETTE) 2 MG gum Take 1 each (2 mg total) by mouth as needed for smoking cessation. 03/16/23   Zigmund Daniel., MD  ondansetron (ZOFRAN) 4 MG tablet Take 1 tablet (4 mg total) by mouth every 6 (six) hours. Patient not taking: Reported on 02/22/2023 01/20/23   Prosperi, Christian H, PA-C  ondansetron (ZOFRAN-ODT) 4 MG disintegrating tablet  Take 1 tablet (4 mg total) by mouth every 8 (eight) hours as needed for nausea or vomiting. Patient not taking: Reported on 03/07/2023 03/01/23   Laurence Spates, MD  OXYGEN Inhale 4-5 L into the lungs as needed (COPD).    [provider]  pantoprazole (PROTONIX) 40 MG tablet Take 1 tablet (40 mg total) by mouth daily. 03/17/23 04/16/23  Zigmund Daniel., MD  sucralfate (CARAFATE) 1 g tablet Take 1 tablet (1 g total) by mouth 4 (four) times daily -  with meals and at bedtime. Patient not taking: Reported on 02/22/2023 02/11/23   Horton, Mayer Masker, MD  tiZANidine (ZANAFLEX) 2 MG tablet Take 2 mg by mouth at bedtime. Patient not taking: Reported on 03/07/2023 02/19/23   [provider]  zonisamide (ZONEGRAN) 100 MG capsule Take 2 capsules (200 mg total) by mouth daily. Patient not taking: Reported on 02/22/2023 01/12/23   Lewie Chamber, MD      Allergies    Iron dextran, Aspirin, and Penicillins    Review of Systems   Review of Systems  Respiratory:  Positive for shortness of breath.     Physical Exam Updated Vital Signs BP (!) 139/101   Pulse (!) 119   Temp 98.3 F (36.8 C) (Oral)   Resp 18   LMP 01/08/2011   SpO2 99%  Physical Exam Vitals reviewed.  Constitutional:      General: [name] is not in acute distress.    Appearance: [name] is not ill-appearing.  Pulmonary:     Effort: Pulmonary effort is normal. No tachypnea.     Breath sounds: No decreased breath sounds or wheezing.  Neurological:     General: No focal deficit present.     Mental Status: [name] is alert and oriented to person, place, and time.     Motor: No weakness.     ED Results / Procedures / Treatments   Labs (all labs ordered are listed, but only abnormal results are displayed) Labs Reviewed - No data to display  EKG None  Radiology No results found.  Procedures Procedures    Medications Ordered in ED Medications  albuterol (VENTOLIN HFA) 108 (90 Base) MCG/ACT inhaler 1 puff (has no  administration in time range)    ED Course/ Medical Decision Making/ A&P                                 Medical Decision Making 57 year old female here today because "[name] wanted to come to the emergency department.  "  Plan-patient does not appear to be having a medical emergency at this time.  [name] is alert and oriented x 3, does not appear to be acutely anxious at this time.  Discussed the situation with the patient's daughter, who is understandably a bit frustrated by [name] mothers behavior of increased trips to the emergency department.  Again advised closer follow-up with PCP.  Unsure if patient will be able to stick to that plan.           Final Clinical Impression(s) / ED Diagnoses Final diagnoses:  General medical exam    Rx / DC Orders ED Discharge Orders     None         Arletha Pili, DO 03/24/23 [phone]

## 2023-03-24 NOTE — ED Notes (Signed)
After ambulating to restroom twice, patient states that she needs some oxygen. Increased WOB noted. SpO2 94%. HR 119. Patient placed on home oxygen requirement at this time. RN aware.

## 2023-03-24 NOTE — ED Notes (Signed)
Patient left before seeing provider. Patient is AAOx4, GCS 15, NAD, speaking in full complete sentences. Patient has all personal belongings.

## 2023-03-24 NOTE — ED Triage Notes (Signed)
Pt left AMA and then decided to check back in, pt was outside smoking and said SOB increased. Pt educated on smoking.

## 2023-03-24 NOTE — ED Notes (Signed)
Pt. Repeatedly making a "hacking" noise. No vomiting noted. Pt. Left AMA from another room, went outside and smoked, then returned to be seen again.

## 2023-03-24 NOTE — ED Notes (Signed)
Attempted x2 to obtain IV on patient.

## 2023-03-24 NOTE — ED Triage Notes (Signed)
Patient reports waking up at 0200 today with shortness of breath and abdominal pain. Patient reports she wears O2 as needed. Patient denies chest pain, N/V/D, and weakness.

## 2023-03-25 ENCOUNTER — Other Ambulatory Visit: Payer: Self-pay

## 2023-03-25 ENCOUNTER — Emergency Department (HOSPITAL_COMMUNITY): Payer: 59

## 2023-03-25 ENCOUNTER — Emergency Department (HOSPITAL_COMMUNITY)
Admission: EM | Admit: 2023-03-25 | Discharge: 2023-03-25 | Disposition: A | Discharge status: Home / Self Care | Payer: 59 | Attending: Emergency Medicine | Admitting: Emergency Medicine

## 2023-03-25 DIAGNOSIS — Z7951 Long term (current) use of inhaled steroids: Secondary | ICD-10-CM | POA: Insufficient documentation

## 2023-03-25 DIAGNOSIS — J449 Chronic obstructive pulmonary disease, unspecified: Secondary | ICD-10-CM | POA: Diagnosis not present

## 2023-03-25 DIAGNOSIS — R1032 Left lower quadrant pain: Secondary | ICD-10-CM | POA: Insufficient documentation

## 2023-03-25 DIAGNOSIS — E1140 Type 2 diabetes mellitus with diabetic neuropathy, unspecified: Secondary | ICD-10-CM | POA: Diagnosis not present

## 2023-03-25 DIAGNOSIS — Z79899 Other long term (current) drug therapy: Secondary | ICD-10-CM | POA: Diagnosis not present

## 2023-03-25 DIAGNOSIS — R1013 Epigastric pain: Secondary | ICD-10-CM | POA: Diagnosis not present

## 2023-03-25 DIAGNOSIS — R69 Illness, unspecified: Secondary | ICD-10-CM | POA: Diagnosis not present

## 2023-03-25 DIAGNOSIS — R918 Other nonspecific abnormal finding of lung field: Secondary | ICD-10-CM | POA: Diagnosis not present

## 2023-03-25 DIAGNOSIS — D649 Anemia, unspecified: Secondary | ICD-10-CM | POA: Diagnosis not present

## 2023-03-25 DIAGNOSIS — R519 Headache, unspecified: Secondary | ICD-10-CM | POA: Insufficient documentation

## 2023-03-25 DIAGNOSIS — R0602 Shortness of breath: Secondary | ICD-10-CM | POA: Diagnosis not present

## 2023-03-25 DIAGNOSIS — R1031 Right lower quadrant pain: Secondary | ICD-10-CM | POA: Diagnosis not present

## 2023-03-25 DIAGNOSIS — R109 Unspecified abdominal pain: Secondary | ICD-10-CM

## 2023-03-25 DIAGNOSIS — Z743 Need for continuous supervision: Secondary | ICD-10-CM | POA: Diagnosis not present

## 2023-03-25 LAB — URINALYSIS, ROUTINE W REFLEX MICROSCOPIC
Bilirubin Urine: NEGATIVE
Glucose, UA: NEGATIVE mg/dL
Hgb urine dipstick: NEGATIVE
Ketones, ur: NEGATIVE mg/dL
Leukocytes,Ua: NEGATIVE
Nitrite: NEGATIVE
Protein, ur: NEGATIVE mg/dL
Specific Gravity, Urine: 1.003 — ABNORMAL LOW (ref 1.005–1.030)
pH: 7 (ref 5.0–8.0)

## 2023-03-25 LAB — BASIC METABOLIC PANEL
Anion gap: 14 (ref 5–15)
BUN: <5 mg/dL — ABNORMAL LOW (ref 6–20)
CO2: 21 mmol/L — ABNORMAL LOW (ref 22–32)
Calcium: 8.7 mg/dL — ABNORMAL LOW (ref 8.9–10.3)
Chloride: 100 mmol/L (ref 98–111)
Creatinine, Ser: 0.49 mg/dL (ref 0.44–1.00)
GFR, Estimated: >60 mL/min (ref >60)
Glucose, Bld: 200 mg/dL — ABNORMAL HIGH (ref 70–99)
Potassium: 4 mmol/L (ref 3.5–5.1)
Sodium: 135 mmol/L (ref 135–145)

## 2023-03-25 LAB — CBC
HCT: 30 % — ABNORMAL LOW (ref 36.0–46.0)
Hemoglobin: 8.9 g/dL — ABNORMAL LOW (ref 12.0–15.0)
MCH: 23 pg — ABNORMAL LOW (ref 26.0–34.0)
MCHC: 29.7 g/dL — ABNORMAL LOW (ref 30.0–36.0)
MCV: 77.5 fL — ABNORMAL LOW (ref 80.0–100.0)
Platelets: 396 10*3/uL (ref 150–400)
RBC: 3.87 MIL/uL (ref 3.87–5.11)
RDW: 21.2 % — ABNORMAL HIGH (ref 11.5–15.5)
WBC: 7.8 10*3/uL (ref 4.0–10.5)
nRBC: 0 % (ref 0.0–0.2)

## 2023-03-25 LAB — TROPONIN I (HIGH SENSITIVITY)
Troponin I (High Sensitivity): 7 ng/L (ref <18)
Troponin I (High Sensitivity): 8 ng/L (ref <18)

## 2023-03-25 LAB — LIPASE, BLOOD: Lipase: 33 U/L (ref 11–51)

## 2023-03-25 MED ORDER — ACETAMINOPHEN 500 MG PO TABS
1000.0000 mg | ORAL_TABLET | Freq: Once | ORAL | Status: AC
  Administered 2023-03-25: 1000 mg via ORAL
  Filled 2023-03-25: qty 2

## 2023-03-25 MED ORDER — DICYCLOMINE HCL 10 MG PO CAPS
10.0000 mg | ORAL_CAPSULE | Freq: Once | ORAL | Status: AC
  Administered 2023-03-25: 10 mg via ORAL
  Filled 2023-03-25: qty 1

## 2023-03-25 NOTE — Discharge Instructions (Signed)
This a pleasure taking part in your care today.  As we discussed, your Is reassuring.  Please continue taking Tylenol every 6 hours as needed for headaches.  Please follow-up with your primary care doctor for repeat evaluation.  Please return to the ED with any new or worsening signs or symptoms.

## 2023-03-25 NOTE — ED Provider Notes (Signed)
[facility] AT [facility] Provider Note   CSN: [id number] Arrival date & time: 03/25/23  0445     History  Chief Complaint  Patient presents with   Abdominal Pain    [name] is a 57 y.o. female with medical history of schizophrenia, atrial fibrillation, abdominal pain, anxiety, chronic back pain, chronic knee pain, chronic nausea, chronic pain, COPD, diabetes, diabetic neuropathy, dyspnea.  Patient presents to the ED for evaluation of headache and abdominal pain.  Patient reports that these both began yesterday.  Patient was seen twice in the ED yesterday initially for shortness of breath and then subsequently for a fall.  Patient had unremarkable workup and was discharged.  The patient returns today complaining of abdominal pain that began after discharge yesterday along with a headache.  She denies any medications prior to arrival.  States that she drank 2 icehouse 24 ounce beers yesterday, denies doing this often.  She denies any nausea, vomiting, diarrhea.  Reports that her last bowel movement was this morning and largely normal.  Denies any one-sided weakness or numbness, neck pain, photophobia, fevers, visual disturbance.  Denies any sick contacts.  States that she currently lives with her brother.   Abdominal Pain Associated symptoms: no constipation, no diarrhea, no nausea and no vomiting        Home Medications Prior to Admission medications   Medication Sig Start Date End Date Taking? Authorizing Provider  acetaminophen (TYLENOL) 325 MG tablet Take 650 mg by mouth every 6 (six) hours as needed for mild pain.    [provider]  albuterol (VENTOLIN HFA) 108 (90 Base) MCG/ACT inhaler Inhale 1-2 puffs into the lungs every 6 (six) hours as needed for wheezing or shortness of breath. 02/25/23   Sabas Sous, MD  albuterol (VENTOLIN HFA) 108 (90 Base) MCG/ACT inhaler Inhale 1-2 puffs into the lungs every 6 (six) hours as needed for  wheezing or shortness of breath. 03/18/23   Palumbo, April, MD  ARIPiprazole (ABILIFY) 10 MG tablet Take 1 tablet (10 mg total) by mouth daily. Follow up with psychiatry outpatient for medication refills and adjustments 03/16/23 04/15/23  Zigmund Daniel., MD  budesonide-formoterol [facility]) 160-4.5 MCG/ACT inhaler Inhale 2 puffs into the lungs 2 (two) times daily. 02/25/23   Sabas Sous, MD  carbamazepine (TEGRETOL) 200 MG tablet 800mg  PO QD X 1D, then 600mg  PO QD X 1D, then 400mg  QD X 1D, then 200mg  PO QD X 2D 03/18/23   Palumbo, April, MD  cyanocobalamin (VITAMIN B12) 1000 MCG tablet Take 1 tablet (1,000 mcg total) by mouth daily. 03/16/23 07/24/23  Zigmund Daniel., MD  diclofenac Sodium (VOLTAREN) 1 % GEL Apply 2 g topically 4 (four) times daily. Patient not taking: Reported on 03/07/2023 02/19/23   [provider]  famotidine (PEPCID) 20 MG tablet Take 1 tablet (20 mg total) by mouth daily. Patient not taking: Reported on 03/07/2023 02/25/23   Sabas Sous, MD  ferrous sulfate 325 (65 FE) MG tablet Take 1 tablet (325 mg total) by mouth every other day. 03/18/23 10/02/23  Zigmund Daniel., MD  folic acid (FOLVITE) 1 MG tablet Take 1 tablet (1 mg total) by mouth daily. 03/17/23 05/16/23  Zigmund Daniel., MD  furosemide (LASIX) 20 MG tablet Take 1 tablet (20 mg total) by mouth daily. Patient not taking: Reported on 03/07/2023 02/24/23   Achille Rich, PA-C  gabapentin (NEURONTIN) 100 MG capsule Take 100 mg by  mouth 3 (three) times daily. Patient not taking: Reported on 03/07/2023 02/19/23   [provider]  hyoscyamine (LEVSIN/SL) 0.125 MG SL tablet Place 1 tablet (0.125 mg total) under the tongue every 4 (four) hours as needed (pain). Up to 1.25 mg daily Patient not taking: Reported on 02/22/2023 01/23/23   Arthor Captain, PA-C  nicotine (NICODERM CQ - DOSED IN MG/24 HOURS) 21 mg/24hr patch Place 1 patch (21 mg total) onto the skin daily. 03/16/23   Zigmund Daniel., MD  nicotine polacrilex (NICORETTE) 2 MG gum Take 1 each (2 mg total) by mouth as needed for smoking cessation. 03/16/23   Zigmund Daniel., MD  ondansetron (ZOFRAN) 4 MG tablet Take 1 tablet (4 mg total) by mouth every 6 (six) hours. Patient not taking: Reported on 02/22/2023 01/20/23   Prosperi, Christian H, PA-C  ondansetron (ZOFRAN-ODT) 4 MG disintegrating tablet Take 1 tablet (4 mg total) by mouth every 8 (eight) hours as needed for nausea or vomiting. Patient not taking: Reported on 03/07/2023 03/01/23   Laurence Spates, MD  OXYGEN Inhale 4-5 L into the lungs as needed (COPD).    [provider]  pantoprazole (PROTONIX) 40 MG tablet Take 1 tablet (40 mg total) by mouth daily. 03/17/23 04/16/23  Zigmund Daniel., MD  sucralfate (CARAFATE) 1 g tablet Take 1 tablet (1 g total) by mouth 4 (four) times daily -  with meals and at bedtime. Patient not taking: Reported on 02/22/2023 02/11/23   Horton, Mayer Masker, MD  tiZANidine (ZANAFLEX) 2 MG tablet Take 2 mg by mouth at bedtime. Patient not taking: Reported on 03/07/2023 02/19/23   [provider]  zonisamide (ZONEGRAN) 100 MG capsule Take 2 capsules (200 mg total) by mouth daily. Patient not taking: Reported on 02/22/2023 01/12/23   Lewie Chamber, MD      Allergies    Iron dextran, Aspirin, and Penicillins    Review of Systems   Review of Systems  Eyes:  Negative for photophobia and visual disturbance.  Gastrointestinal:  Positive for abdominal pain. Negative for constipation, diarrhea, nausea and vomiting.  Musculoskeletal:  Negative for neck pain.  Neurological:  Positive for headaches. Negative for weakness and numbness.  All other systems reviewed and are negative.   Physical Exam Updated Vital Signs BP (!) 142/77 (BP Location: Left Arm)   Pulse (!) 120   Temp 98.8 F (37.1 C)   Resp 19   Ht 5\' 2"  (1.575 m)   LMP 01/08/2011   SpO2 98%   BMI 21.49 kg/m  Physical Exam Constitutional:      General:  She is not in acute distress.    Appearance: Normal appearance. She is not ill-appearing, toxic-appearing or diaphoretic.  HENT:     Head: Normocephalic and atraumatic.     Nose: Nose normal.     Mouth/Throat:     Mouth: Mucous membranes are moist.     Pharynx: Oropharynx is clear.  Eyes:     Extraocular Movements: Extraocular movements intact.     Conjunctiva/sclera: Conjunctivae normal.     Pupils: Pupils are equal, round, and reactive to light.  Cardiovascular:     Rate and Rhythm: Normal rate and regular rhythm.  Pulmonary:     Effort: Pulmonary effort is normal.     Breath sounds: Normal breath sounds. No wheezing.  Abdominal:     General: Abdomen is flat. Bowel sounds are normal.     Palpations: Abdomen is soft.  Tenderness: There is no abdominal tenderness. There is no right CVA tenderness or left CVA tenderness.     Comments: No tenderness to all 4 quadrants of abdomen.  Soft and compressible.  No overlying skin change.  Negative CVA tenderness bilaterally.  Musculoskeletal:     Cervical back: Normal range of motion and neck supple. No tenderness.  Skin:    General: Skin is warm and dry.  Neurological:     General: No focal deficit present.     Mental Status: She is alert.     GCS: GCS eye subscore is 4. GCS verbal subscore is 5. GCS motor subscore is 6.     Cranial Nerves: Cranial nerves 2-12 are intact. No cranial nerve deficit.     Sensory: Sensation is intact. No sensory deficit.     Motor: Motor function is intact. No weakness.     Coordination: Coordination is intact. Heel to [facility] Test normal.     Comments: No focal neurodeficits on examination.  CN II through XII intact.  Intact finger-nose, heel-to-shin.  5 out of 5 strength bilateral upper extremities.  5 out of 5 strength bilateral lower extremities.     ED Results / Procedures / Treatments   Labs (all labs ordered are listed, but only abnormal results are displayed) Labs Reviewed  CBC - Abnormal;  Notable for the following components:      Result Value   Hemoglobin 8.9 (*)    HCT 30.0 (*)    MCV 77.5 (*)    MCH 23.0 (*)    MCHC 29.7 (*)    RDW 21.2 (*)    All other components within normal limits  BASIC METABOLIC PANEL - Abnormal; Notable for the following components:   CO2 21 (*)    Glucose, Bld 200 (*)    BUN <5 (*)    Calcium 8.7 (*)    All other components within normal limits  URINALYSIS, ROUTINE W REFLEX MICROSCOPIC - Abnormal; Notable for the following components:   Color, Urine STRAW (*)    Specific Gravity, Urine 1.003 (*)    All other components within normal limits  LIPASE, BLOOD  TROPONIN I (HIGH SENSITIVITY)  TROPONIN I (HIGH SENSITIVITY)    EKG None  Radiology DG Chest 1 View  Result Date: 03/25/2023 CLINICAL DATA:  Epigastric pain.  Shortness of breath. EXAM: CHEST  1 VIEW COMPARISON:  PA and lateral 03/21/2023 FINDINGS: The lungs are slightly emphysematous. Linear scarring or atelectasis noted left lung base. No focal pneumonia is evident. No substantial pleural effusion. The cardiomediastinal silhouette and vascular pattern are normal. Slight thoracic dextroscoliosis and osteopenia. IMPRESSION: No acute cardiopulmonary disease. Stable COPD changes with left base scarring or atelectasis. Slight thoracic dextroscoliosis and osteopenia. Electronically Signed   By: Almira Bar M.D.   On: 03/25/2023 05:43    Procedures Procedures   Medications Ordered in ED Medications  acetaminophen (TYLENOL) tablet 1,000 mg (1,000 mg Oral Given 03/25/23 0932)  dicyclomine (BENTYL) capsule 10 mg (10 mg Oral Given 03/25/23 0932)    ED Course/ Medical Decision Making/ A&P    Medical Decision Making Amount and/or Complexity of Data Reviewed Labs: ordered. Radiology: ordered.   57 year old female presents to the ED for evaluation.  Please see HPI for further details.  On examination the patient is afebrile, tachycardic however patient is very anxious she states.   The patient lung sounds are clear bilaterally, she is not hypoxic.  Abdomen is soft and compressible throughout with no tenderness.  Neurological examination is at baseline without focal neurodeficits.  Patient CBC without leukocytosis, baseline hemoglobin 8.9.  Metabolic panel shows glucose 200 however anion gap 14, no electrolyte derangement.  Patient troponin 7, 8 delta.  Urinalysis unremarkable.  Chest x-ray shows no consolidations or effusions.  EKG is nonischemic.  Patient given Tylenol for headache, Bentyl for abdominal pain.  On reassessment, patient reports that her headache has decreased as well as her abdominal pain.  Consideration was given to scanning patient's abdomen however in the setting of no tenderness on exam and improving clinical condition will defer at this time.  I attempted to call the patient daughter at the patient's request prior to discharge however the patient daughter did not answer.  Patient will be discharged home at this time.  The patient was advised to follow-up with her PCP for further management.  She was encouraged to return to the ED with any new or worsening signs or symptoms.   Final Clinical Impression(s) / ED Diagnoses Final diagnoses:  Abdominal pain, unspecified abdominal location  Nonintractable headache, unspecified chronicity pattern, unspecified headache type    Rx / DC Orders ED Discharge Orders     None         Clent Ridges 03/25/23 1143    Lorre Nick, MD 03/26/23 1605

## 2023-03-25 NOTE — ED Triage Notes (Signed)
Pt arrives to ED via EMS. Pt with no initial complaint with EMS. Once asked in Triage pt hesitantly states epigastric abdominal pain x 1 day. No n/v reported. Pt endorses no drug use tonigiht

## 2023-03-25 NOTE — ED Notes (Signed)
This RN reviewed discharge instructions with patient. She verbalized understanding and denied any further questions. PT well appearing upon discharge and reports tolerable pain. Pt ambulated with stable gait to exit. Pt endorses ride home.  

## 2023-03-26 DIAGNOSIS — J449 Chronic obstructive pulmonary disease, unspecified: Secondary | ICD-10-CM | POA: Diagnosis not present

## 2023-03-29 ENCOUNTER — Other Ambulatory Visit: Payer: Self-pay

## 2023-03-29 ENCOUNTER — Encounter (HOSPITAL_COMMUNITY): Payer: Self-pay | Admitting: Emergency Medicine

## 2023-03-29 ENCOUNTER — Emergency Department (HOSPITAL_COMMUNITY)
Admission: EM | Admit: 2023-03-29 | Discharge: 2023-03-29 | Disposition: A | Discharge status: Home / Self Care | Payer: 59 | Attending: Emergency Medicine | Admitting: Emergency Medicine

## 2023-03-29 DIAGNOSIS — R0602 Shortness of breath: Secondary | ICD-10-CM | POA: Diagnosis not present

## 2023-03-29 DIAGNOSIS — I10 Essential (primary) hypertension: Secondary | ICD-10-CM | POA: Diagnosis not present

## 2023-03-29 DIAGNOSIS — E1140 Type 2 diabetes mellitus with diabetic neuropathy, unspecified: Secondary | ICD-10-CM | POA: Diagnosis not present

## 2023-03-29 DIAGNOSIS — I480 Paroxysmal atrial fibrillation: Secondary | ICD-10-CM | POA: Diagnosis not present

## 2023-03-29 DIAGNOSIS — J449 Chronic obstructive pulmonary disease, unspecified: Secondary | ICD-10-CM | POA: Insufficient documentation

## 2023-03-29 DIAGNOSIS — E039 Hypothyroidism, unspecified: Secondary | ICD-10-CM | POA: Diagnosis not present

## 2023-03-29 DIAGNOSIS — I499 Cardiac arrhythmia, unspecified: Secondary | ICD-10-CM | POA: Diagnosis not present

## 2023-03-29 DIAGNOSIS — Z743 Need for continuous supervision: Secondary | ICD-10-CM | POA: Diagnosis not present

## 2023-03-29 NOTE — ED Provider Notes (Signed)
[place] [facility] AT [facility] Provider Note  CSN: [id number] Arrival date & time: 03/29/23 4098  Chief Complaint(s) Shortness of Breath  HPI [name] is a 57 y.o. female with a past medical history listed below including COPD with 4 L nasal cannula as needed is here for shortness of breath.  Patient brought in by EMS who gave patient DuoNeb.  Patient reports significant relief following the breathing treatment.  She denies any cough or congestion.  No fevers or chills.  No chest pain.  No peripheral edema.  Additionally patient endorses a bump adjacent to surgical abd scar.  HPI  Past Medical History Past Medical History:  Diagnosis Date   Abdominal pain    Accidental drug overdose April 2013   Anxiety    Atrial fibrillation (HCC) 09/29/11   converted spontaneously   Chronic back pain    Chronic knee pain    Chronic nausea    Chronic pain    COPD (chronic obstructive pulmonary disease) (HCC)    Depression    Diabetes mellitus    states her doctor took her off all DM meds in past month   Diabetic neuropathy (HCC)    Dyspnea    with exertion    GERD (gastroesophageal reflux disease)    Headache(784.0)    migraines    HTN (hypertension)    not on meds since in a year    Hyperlipidemia    Hypothyroidism    not on meds in a while    Mental disorder    Bipolar and schizophrenic   Nausea and vomiting 01/02/2023   Requires supplemental oxygen    as needed per patient    Schizophrenia (HCC)    Schizophrenia, acute (HCC) 11/13/2017   Tobacco abuse    Patient Active Problem List   Diagnosis Date Noted   Acute on chronic anemia 03/13/2023   B12 deficiency 03/13/2023   Hyponatremia 02/22/2023   Chronic alcohol use 02/22/2023   Continuous dependence on cigarette smoking 02/22/2023   Alcohol use disorder, severe, dependence (HCC) 01/03/2023   Suicidal ideation 01/03/2023   Abnormal weight loss 01/02/2023   Nausea and vomiting 01/02/2023    Hyperglycemia due to type 2 diabetes mellitus (HCC) 01/02/2023   History of colonic polyps 01/02/2023   Acute on chronic respiratory failure with hypoxia (HCC) 12/11/2022   Pulmonary nodules 12/11/2022   Type 2 diabetes mellitus without complications (HCC) 12/11/2022   Malnutrition of moderate degree 11/22/2022   Withdrawal symptoms, alcohol (HCC) 11/20/2022   CAP (community acquired pneumonia) 11/20/2022   Closed fracture of right zygomaticomaxillary complex (HCC) 08/26/2022   Blunt trauma 08/26/2022   Alcohol withdrawal (HCC) 08/25/2022   Seizure-like activity (HCC) 08/23/2022   Cocaine abuse (HCC) 08/23/2022   Closed fracture of left proximal humerus 07/08/2022   Homicidal ideation 07/07/2022   Aggressive behavior 07/05/2022   Hyperbilirubinemia 06/16/2022   Pressure injury of skin 06/16/2022   Pulmonary embolism (HCC) 05/19/2022   Postoperative wound infection 05/04/2022   Thrombocytosis 05/04/2022   Seizure (HCC) 04/17/2022   Memory loss 04/17/2022   SOB (shortness of breath) 04/05/2022   Abnormal EEG 02/12/2022   Constipation 02/12/2022   Vitamin A deficiency 02/12/2022   Vitamin D deficiency 02/12/2022   Vitamin C deficiency 02/12/2022   Iron deficiency anemia 02/12/2022   GERD (gastroesophageal reflux disease) 02/12/2022   Tobacco abuse 02/12/2022   Protein-calorie malnutrition, severe 01/14/2022   Acute respiratory failure with hypoxia and hypercapnia (HCC) 11/14/2021   Anemia  11/14/2021   Alcohol abuse with intoxication (HCC) 11/12/2021   Dysphagia 05/28/2021   Nasal septal perforation 05/28/2021   Referred otalgia of left ear 05/28/2021   Oral pain 05/28/2021   Encephalopathy 10/10/2020   Polysubstance (excluding opioids) dependence, daily use (HCC) 08/04/2018   Substance induced mood disorder (HCC) 08/03/2018   Schizoaffective disorder, bipolar type (HCC) 11/13/2017   Polysubstance abuse (HCC) 11/13/2017   Type 2 diabetes mellitus (HCC) 09/06/2015   Right  knee pain 01/05/2015   Muscular deconditioning 09/08/2014   Primary insomnia 09/08/2014   Encounter for monitoring opioid maintenance therapy 07/10/2014   Myofascial pain syndrome 12/09/2013   DDD (degenerative disc disease), lumbosacral 08/12/2013   Low back pain 08/12/2013   Lumbosacral spondylosis 04/13/2013   Primary localized osteoarthrosis, lower leg 12/14/2012   Chronic pain 11/27/2012   Hypothyroidism 11/27/2012   Sacroiliitis (HCC) 11/27/2012   Pain in joint involving lower leg 11/27/2012   Essential hypertension 11/04/2011   Paroxysmal atrial fibrillation with RVR (HCC) 09/30/2011   COPD (chronic obstructive pulmonary disease) (HCC) 09/29/2011   Home Medication(s) Prior to Admission medications   Medication Sig Start Date End Date Taking? Authorizing Provider  acetaminophen (TYLENOL) 325 MG tablet Take 650 mg by mouth every 6 (six) hours as needed for mild pain. Patient not taking: Reported on 03/29/2023    [provider]  albuterol (VENTOLIN HFA) 108 (90 Base) MCG/ACT inhaler Inhale 1-2 puffs into the lungs every 6 (six) hours as needed for wheezing or shortness of breath. Patient not taking: Reported on 03/29/2023 02/25/23   Sabas Sous, MD  albuterol (VENTOLIN HFA) 108 (90 Base) MCG/ACT inhaler Inhale 1-2 puffs into the lungs every 6 (six) hours as needed for wheezing or shortness of breath. Patient not taking: Reported on 03/29/2023 03/18/23   Palumbo, April, MD  ARIPiprazole (ABILIFY) 10 MG tablet Take 1 tablet (10 mg total) by mouth daily. Follow up with psychiatry outpatient for medication refills and adjustments Patient not taking: Reported on 03/29/2023 03/16/23 04/15/23  Zigmund Daniel., MD  budesonide-formoterol [facility]) 160-4.5 MCG/ACT inhaler Inhale 2 puffs into the lungs 2 (two) times daily. Patient not taking: Reported on 03/29/2023 02/25/23   Sabas Sous, MD  carbamazepine (TEGRETOL) 200 MG tablet 800mg  PO QD X 1D, then 600mg  PO QD X 1D, then  400mg  QD X 1D, then 200mg  PO QD X 2D Patient not taking: Reported on 03/29/2023 03/18/23   Palumbo, April, MD  cyanocobalamin (VITAMIN B12) 1000 MCG tablet Take 1 tablet (1,000 mcg total) by mouth daily. Patient not taking: Reported on 03/29/2023 03/16/23 07/24/23  Zigmund Daniel., MD  diclofenac Sodium (VOLTAREN) 1 % GEL Apply 2 g topically 4 (four) times daily. Patient not taking: Reported on 03/07/2023 02/19/23   [provider]  famotidine (PEPCID) 20 MG tablet Take 1 tablet (20 mg total) by mouth daily. Patient not taking: Reported on 03/07/2023 02/25/23   Sabas Sous, MD  ferrous sulfate 325 (65 FE) MG tablet Take 1 tablet (325 mg total) by mouth every other day. Patient not taking: Reported on 03/29/2023 03/18/23 10/02/23  Zigmund Daniel., MD  folic acid (FOLVITE) 1 MG tablet Take 1 tablet (1 mg total) by mouth daily. 03/17/23 05/16/23  Zigmund Daniel., MD  furosemide (LASIX) 20 MG tablet Take 1 tablet (20 mg total) by mouth daily. Patient not taking: Reported on 03/07/2023 02/24/23   Achille Rich, PA-C  gabapentin (NEURONTIN) 100 MG capsule Take 100 mg by mouth  3 (three) times daily. Patient not taking: Reported on 03/07/2023 02/19/23   [provider]  hyoscyamine (LEVSIN/SL) 0.125 MG SL tablet Place 1 tablet (0.125 mg total) under the tongue every 4 (four) hours as needed (pain). Up to 1.25 mg daily Patient not taking: Reported on 02/22/2023 01/23/23   Arthor Captain, PA-C  metFORMIN (GLUCOPHAGE-XR) 750 MG 24 hr tablet Take 750 mg by mouth daily. Patient not taking: Reported on 03/29/2023 03/23/23   [provider]  nicotine (NICODERM CQ - DOSED IN MG/24 HOURS) 21 mg/24hr patch Place 1 patch (21 mg total) onto the skin daily. Patient not taking: Reported on 03/29/2023 03/16/23   Zigmund Daniel., MD  nicotine polacrilex (NICORETTE) 2 MG gum Take 1 each (2 mg total) by mouth as needed for smoking cessation. Patient not taking: Reported on 03/29/2023  03/16/23   Zigmund Daniel., MD  ondansetron (ZOFRAN) 4 MG tablet Take 1 tablet (4 mg total) by mouth every 6 (six) hours. Patient not taking: Reported on 02/22/2023 01/20/23   Prosperi, Christian H, PA-C  ondansetron (ZOFRAN-ODT) 4 MG disintegrating tablet Take 1 tablet (4 mg total) by mouth every 8 (eight) hours as needed for nausea or vomiting. Patient not taking: Reported on 03/07/2023 03/01/23   Laurence Spates, MD  OXYGEN Inhale 4-5 L into the lungs as needed (COPD).    [provider]  pantoprazole (PROTONIX) 40 MG tablet Take 1 tablet (40 mg total) by mouth daily. Patient not taking: Reported on 03/29/2023 03/17/23 04/16/23  Zigmund Daniel., MD  sucralfate (CARAFATE) 1 g tablet Take 1 tablet (1 g total) by mouth 4 (four) times daily -  with meals and at bedtime. Patient not taking: Reported on 02/22/2023 02/11/23   Horton, Mayer Masker, MD  tiZANidine (ZANAFLEX) 2 MG tablet Take 2 mg by mouth at bedtime. Patient not taking: Reported on 03/07/2023 02/19/23   [provider]  zonisamide (ZONEGRAN) 100 MG capsule Take 2 capsules (200 mg total) by mouth daily. Patient not taking: Reported on 02/22/2023 01/12/23   Lewie Chamber, MD                                                                                                                                    Allergies Iron dextran, Aspirin, and Penicillins  Review of Systems Review of Systems As noted in HPI  Physical Exam Vital Signs  I have reviewed the triage vital signs BP 126/80 (BP Location: Right Arm)   Pulse (!) 111   Temp 99 F (37.2 C) (Oral)   Resp 18   Ht 5\' 2"  (1.575 m)   Wt 53.3 kg   LMP 01/08/2011   SpO2 97%   BMI 21.49 kg/m   Physical Exam Vitals reviewed.  Constitutional:      General: She is not in acute distress.    Appearance: She is well-developed. She is not diaphoretic.  HENT:  Head: Normocephalic and atraumatic.     Nose: Nose normal.  Eyes:     General: No scleral icterus.        Right eye: No discharge.        Left eye: No discharge.     Conjunctiva/sclera: Conjunctivae normal.     Pupils: Pupils are equal, round, and reactive to light.  Cardiovascular:     Rate and Rhythm: Normal rate and regular rhythm.     Heart sounds: No murmur heard.    No friction rub. No gallop.  Pulmonary:     Effort: Pulmonary effort is normal. No respiratory distress.     Breath sounds: Normal breath sounds. No stridor. No rales.  Abdominal:     General: A surgical scar is present. There is no distension.     Palpations: Abdomen is soft.     Tenderness: There is no abdominal tenderness.    Musculoskeletal:        General: No tenderness.     Cervical back: Normal range of motion and neck supple.  Skin:    General: Skin is warm and dry.     Findings: No erythema or rash.  Neurological:     Mental Status: She is alert and oriented to person, place, and time.     ED Results and Treatments Labs (all labs ordered are listed, but only abnormal results are displayed) Labs Reviewed - No data to display                                                                                                                       EKG  EKG Interpretation Date/Time:    Ventricular Rate:    PR Interval:    QRS Duration:    QT Interval:    QTC Calculation:   R Axis:      Text Interpretation:         Radiology No results found.  Medications Ordered in ED Medications - No data to display Procedures Procedures  (including critical care time) Medical Decision Making / ED Course   Medical Decision Making   Patient is here for SOB. Resolved with duoneb from EMS.  Lungs clear to auscultation bilaterally with good air movement.  Low suspicion for pneumonia, pneumothorax, pulmonary edema.  No associated chest pain concerning for cardiac etiology.  Patient is satting greater than 98% on room air and in no respiratory distress at this time.  Do not feel that chest x-ray is required  at this time.  Patient monitored for approximately 2 hours without any recurrence of her shortness of breath.  Tender abdominal wall papule does not appear to be infected.  Uncertain etiology but no workup necessary at this time.    Final Clinical Impression(s) / ED Diagnoses Final diagnoses:  Shortness of breath   The patient appears reasonably screened and/or stabilized for discharge and I doubt any other medical condition or other [facility] requiring further screening, evaluation, or treatment in the ED at this  time. I have discussed the findings, Dx and Tx plan with the patient/family who expressed understanding and agree(s) with the plan. Discharge instructions discussed at length. The patient/family was given strict return precautions who verbalized understanding of the instructions. No further questions at time of discharge.  Disposition: Discharge  Condition: Good  ED Discharge Orders     None       Follow Up: Fleet Contras, MD [address] [place] [address] [phone]  Call  to schedule an appointment for close follow up     This chart was dictated using voice recognition software.  Despite best efforts to proofread,  errors can occur which can change the documentation meaning.    Nira Conn, MD 03/29/23 [phone]

## 2023-03-29 NOTE — ED Triage Notes (Signed)
PT BIB EMS from home for [facility] and sore bump to the left of her navel that is causing her 8/10 pain.

## 2023-03-30 ENCOUNTER — Emergency Department (HOSPITAL_COMMUNITY): Payer: 59

## 2023-03-30 ENCOUNTER — Emergency Department (HOSPITAL_COMMUNITY)
Admission: EM | Admit: 2023-03-30 | Discharge: 2023-03-30 | Disposition: A | Discharge status: Home / Self Care | Payer: 59 | Attending: Emergency Medicine | Admitting: Emergency Medicine

## 2023-03-30 ENCOUNTER — Other Ambulatory Visit: Payer: Self-pay

## 2023-03-30 DIAGNOSIS — I499 Cardiac arrhythmia, unspecified: Secondary | ICD-10-CM | POA: Diagnosis not present

## 2023-03-30 DIAGNOSIS — I10 Essential (primary) hypertension: Secondary | ICD-10-CM | POA: Diagnosis not present

## 2023-03-30 DIAGNOSIS — J449 Chronic obstructive pulmonary disease, unspecified: Secondary | ICD-10-CM | POA: Diagnosis not present

## 2023-03-30 DIAGNOSIS — R000 Tachycardia, unspecified: Secondary | ICD-10-CM | POA: Insufficient documentation

## 2023-03-30 DIAGNOSIS — F17200 Nicotine dependence, unspecified, uncomplicated: Secondary | ICD-10-CM | POA: Insufficient documentation

## 2023-03-30 DIAGNOSIS — Z743 Need for continuous supervision: Secondary | ICD-10-CM | POA: Diagnosis not present

## 2023-03-30 DIAGNOSIS — R0602 Shortness of breath: Secondary | ICD-10-CM | POA: Insufficient documentation

## 2023-03-30 DIAGNOSIS — E119 Type 2 diabetes mellitus without complications: Secondary | ICD-10-CM | POA: Diagnosis not present

## 2023-03-30 DIAGNOSIS — E039 Hypothyroidism, unspecified: Secondary | ICD-10-CM | POA: Insufficient documentation

## 2023-03-30 LAB — CBC WITH DIFFERENTIAL/PLATELET
Abs Immature Granulocytes: 0.04 10*3/uL (ref 0.00–0.07)
Basophils Absolute: 0 10*3/uL (ref 0.0–0.1)
Basophils Relative: 0 %
Eosinophils Absolute: 0 10*3/uL (ref 0.0–0.5)
Eosinophils Relative: 1 %
HCT: 28.2 % — ABNORMAL LOW (ref 36.0–46.0)
Hemoglobin: 8.4 g/dL — ABNORMAL LOW (ref 12.0–15.0)
Immature Granulocytes: 1 %
Lymphocytes Relative: 9 %
Lymphs Abs: 0.8 10*3/uL (ref 0.7–4.0)
MCH: 23.6 pg — ABNORMAL LOW (ref 26.0–34.0)
MCHC: 29.8 g/dL — ABNORMAL LOW (ref 30.0–36.0)
MCV: 79.2 fL — ABNORMAL LOW (ref 80.0–100.0)
Monocytes Absolute: 0.6 10*3/uL (ref 0.1–1.0)
Monocytes Relative: 7 %
Neutro Abs: 7.2 10*3/uL (ref 1.7–7.7)
Neutrophils Relative %: 82 %
Platelets: 375 10*3/uL (ref 150–400)
RBC: 3.56 MIL/uL — ABNORMAL LOW (ref 3.87–5.11)
RDW: 21.2 % — ABNORMAL HIGH (ref 11.5–15.5)
WBC: 8.7 10*3/uL (ref 4.0–10.5)
nRBC: 0 % (ref 0.0–0.2)

## 2023-03-30 LAB — COMPREHENSIVE METABOLIC PANEL
ALT: 22 U/L (ref 0–44)
AST: 29 U/L (ref 15–41)
Albumin: 3.8 g/dL (ref 3.5–5.0)
Alkaline Phosphatase: 84 U/L (ref 38–126)
Anion gap: 13 (ref 5–15)
BUN: 6 mg/dL (ref 6–20)
CO2: 21 mmol/L — ABNORMAL LOW (ref 22–32)
Calcium: 8.5 mg/dL — ABNORMAL LOW (ref 8.9–10.3)
Chloride: 102 mmol/L (ref 98–111)
Creatinine, Ser: 0.55 mg/dL (ref 0.44–1.00)
GFR, Estimated: >60 mL/min (ref >60)
Glucose, Bld: 321 mg/dL — ABNORMAL HIGH (ref 70–99)
Potassium: 3.8 mmol/L (ref 3.5–5.1)
Sodium: 136 mmol/L (ref 135–145)
Total Bilirubin: 0.6 mg/dL (ref 0.3–1.2)
Total Protein: 7 g/dL (ref 6.5–8.1)

## 2023-03-30 LAB — RAPID URINE DRUG SCREEN, HOSP PERFORMED
Amphetamines: NOT DETECTED
Barbiturates: NOT DETECTED
Benzodiazepines: NOT DETECTED
Cocaine: NOT DETECTED
Opiates: NOT DETECTED
Tetrahydrocannabinol: NOT DETECTED

## 2023-03-30 LAB — ETHANOL: Alcohol, Ethyl (B): <10 mg/dL (ref <10)

## 2023-03-30 MED ORDER — DIAZEPAM 2 MG PO TABS
2.0000 mg | ORAL_TABLET | Freq: Once | ORAL | Status: AC
  Administered 2023-03-30: 2 mg via ORAL
  Filled 2023-03-30: qty 1

## 2023-03-30 NOTE — ED Provider Notes (Signed)
[place] EMERGENCY DEPARTMENT AT [facility] Provider Note  CSN: [id number] Arrival date & time: 03/30/23 0304  Chief Complaint(s) Shortness of Breath  HPI [name] is a 57 y.o. female with a past medical history listed below including COPD with 4 L nasal cannula as needed is here for shortness of breath. Reports frequent use of albuterol inhaler. Patient called EMS who initially gave patient DuoNeb. She did not feel like coming initially, but called EMS again who encouraged her to be seen. No additional treatments given.  She denies any cough or congestion. No fevers or chills. No chest pain. No peripheral edema   patient reports feeling anxious. Denied etoh or drug use.  The history is provided by the patient.    Past Medical History Past Medical History:  Diagnosis Date   Abdominal pain    Accidental drug overdose April 2013   Anxiety    Atrial fibrillation (HCC) 09/29/11   converted spontaneously   Chronic back pain    Chronic knee pain    Chronic nausea    Chronic pain    COPD (chronic obstructive pulmonary disease) (HCC)    Depression    Diabetes mellitus    states her doctor took her off all DM meds in past month   Diabetic neuropathy (HCC)    Dyspnea    with exertion    GERD (gastroesophageal reflux disease)    Headache(784.0)    migraines    HTN (hypertension)    not on meds since in a year    Hyperlipidemia    Hypothyroidism    not on meds in a while    Mental disorder    Bipolar and schizophrenic   Nausea and vomiting 01/02/2023   Requires supplemental oxygen    as needed per patient    Schizophrenia (HCC)    Schizophrenia, acute (HCC) 11/13/2017   Tobacco abuse    Patient Active Problem List   Diagnosis Date Noted   Acute on chronic anemia 03/13/2023   B12 deficiency 03/13/2023   Hyponatremia 02/22/2023   Chronic alcohol use 02/22/2023   Continuous dependence on cigarette smoking 02/22/2023   Alcohol use disorder, severe, dependence  (HCC) 01/03/2023   Suicidal ideation 01/03/2023   Abnormal weight loss 01/02/2023   Nausea and vomiting 01/02/2023   Hyperglycemia due to type 2 diabetes mellitus (HCC) 01/02/2023   History of colonic polyps 01/02/2023   Acute on chronic respiratory failure with hypoxia (HCC) 12/11/2022   Pulmonary nodules 12/11/2022   Type 2 diabetes mellitus without complications (HCC) 12/11/2022   Malnutrition of moderate degree 11/22/2022   Withdrawal symptoms, alcohol (HCC) 11/20/2022   CAP (community acquired pneumonia) 11/20/2022   Closed fracture of right zygomaticomaxillary complex (HCC) 08/26/2022   Blunt trauma 08/26/2022   Alcohol withdrawal (HCC) 08/25/2022   Seizure-like activity (HCC) 08/23/2022   Cocaine abuse (HCC) 08/23/2022   Closed fracture of left proximal humerus 07/08/2022   Homicidal ideation 07/07/2022   Aggressive behavior 07/05/2022   Hyperbilirubinemia 06/16/2022   Pressure injury of skin 06/16/2022   Pulmonary embolism (HCC) 05/19/2022   Postoperative wound infection 05/04/2022   Thrombocytosis 05/04/2022   Seizure (HCC) 04/17/2022   Memory loss 04/17/2022   SOB (shortness of breath) 04/05/2022   Abnormal EEG 02/12/2022   Constipation 02/12/2022   Vitamin A deficiency 02/12/2022   Vitamin D deficiency 02/12/2022   Vitamin C deficiency 02/12/2022   Iron deficiency anemia 02/12/2022   GERD (gastroesophageal reflux disease) 02/12/2022   Tobacco abuse  02/12/2022   Protein-calorie malnutrition, severe 01/14/2022   Acute respiratory failure with hypoxia and hypercapnia (HCC) 11/14/2021   Anemia 11/14/2021   Alcohol abuse with intoxication (HCC) 11/12/2021   Dysphagia 05/28/2021   Nasal septal perforation 05/28/2021   Referred otalgia of left ear 05/28/2021   Oral pain 05/28/2021   Encephalopathy 10/10/2020   Polysubstance (excluding opioids) dependence, daily use (HCC) 08/04/2018   Substance induced mood disorder (HCC) 08/03/2018   Schizoaffective disorder,  bipolar type (HCC) 11/13/2017   Polysubstance abuse (HCC) 11/13/2017   Type 2 diabetes mellitus (HCC) 09/06/2015   Right knee pain 01/05/2015   Muscular deconditioning 09/08/2014   Primary insomnia 09/08/2014   Encounter for monitoring opioid maintenance therapy 07/10/2014   Myofascial pain syndrome 12/09/2013   DDD (degenerative disc disease), lumbosacral 08/12/2013   Low back pain 08/12/2013   Lumbosacral spondylosis 04/13/2013   Primary localized osteoarthrosis, lower leg 12/14/2012   Chronic pain 11/27/2012   Hypothyroidism 11/27/2012   Sacroiliitis (HCC) 11/27/2012   Pain in joint involving lower leg 11/27/2012   Essential hypertension 11/04/2011   Paroxysmal atrial fibrillation with RVR (HCC) 09/30/2011   COPD (chronic obstructive pulmonary disease) (HCC) 09/29/2011   Home Medication(s) Prior to Admission medications   Medication Sig Start Date End Date Taking? Authorizing Provider  acetaminophen (TYLENOL) 325 MG tablet Take 650 mg by mouth every 6 (six) hours as needed for mild pain. Patient not taking: Reported on 03/29/2023    [provider]  albuterol (VENTOLIN HFA) 108 (90 Base) MCG/ACT inhaler Inhale 1-2 puffs into the lungs every 6 (six) hours as needed for wheezing or shortness of breath. Patient not taking: Reported on 03/29/2023 02/25/23   Sabas Sous, MD  albuterol (VENTOLIN HFA) 108 (90 Base) MCG/ACT inhaler Inhale 1-2 puffs into the lungs every 6 (six) hours as needed for wheezing or shortness of breath. Patient not taking: Reported on 03/29/2023 03/18/23   Palumbo, April, MD  ARIPiprazole (ABILIFY) 10 MG tablet Take 1 tablet (10 mg total) by mouth daily. Follow up with psychiatry outpatient for medication refills and adjustments Patient not taking: Reported on 03/29/2023 03/16/23 04/15/23  Zigmund Daniel., MD  budesonide-formoterol [facility] 160-4.5 MCG/ACT inhaler Inhale 2 puffs into the lungs 2 (two) times daily. Patient not taking: Reported on  03/29/2023 02/25/23   Sabas Sous, MD  carbamazepine (TEGRETOL) 200 MG tablet 800mg  PO QD X 1D, then 600mg  PO QD X 1D, then 400mg  QD X 1D, then 200mg  PO QD X 2D Patient not taking: Reported on 03/29/2023 03/18/23   Palumbo, April, MD  cyanocobalamin (VITAMIN B12) 1000 MCG tablet Take 1 tablet (1,000 mcg total) by mouth daily. Patient not taking: Reported on 03/29/2023 03/16/23 07/24/23  Zigmund Daniel., MD  diclofenac Sodium (VOLTAREN) 1 % GEL Apply 2 g topically 4 (four) times daily. Patient not taking: Reported on 03/07/2023 02/19/23   [provider]  famotidine (PEPCID) 20 MG tablet Take 1 tablet (20 mg total) by mouth daily. Patient not taking: Reported on 03/07/2023 02/25/23   Sabas Sous, MD  ferrous sulfate 325 (65 FE) MG tablet Take 1 tablet (325 mg total) by mouth every other day. Patient not taking: Reported on 03/29/2023 03/18/23 10/02/23  Zigmund Daniel., MD  folic acid (FOLVITE) 1 MG tablet Take 1 tablet (1 mg total) by mouth daily. Patient not taking: Reported on 03/30/2023 03/17/23 05/16/23  Zigmund Daniel., MD  furosemide (LASIX) 20 MG tablet Take 1 tablet (20 mg  total) by mouth daily. Patient not taking: Reported on 03/07/2023 02/24/23   Achille Rich, PA-C  gabapentin (NEURONTIN) 100 MG capsule Take 100 mg by mouth 3 (three) times daily. Patient not taking: Reported on 03/07/2023 02/19/23   [provider]  hyoscyamine (LEVSIN/SL) 0.125 MG SL tablet Place 1 tablet (0.125 mg total) under the tongue every 4 (four) hours as needed (pain). Up to 1.25 mg daily Patient not taking: Reported on 02/22/2023 01/23/23   Arthor Captain, PA-C  metFORMIN (GLUCOPHAGE-XR) 750 MG 24 hr tablet Take 750 mg by mouth daily. Patient not taking: Reported on 03/29/2023 03/23/23   [provider]  nicotine (NICODERM CQ - DOSED IN MG/24 HOURS) 21 mg/24hr patch Place 1 patch (21 mg total) onto the skin daily. Patient not taking: Reported on 03/29/2023 03/16/23   Zigmund Daniel., MD  nicotine polacrilex (NICORETTE) 2 MG gum Take 1 each (2 mg total) by mouth as needed for smoking cessation. Patient not taking: Reported on 03/29/2023 03/16/23   Zigmund Daniel., MD  ondansetron (ZOFRAN) 4 MG tablet Take 1 tablet (4 mg total) by mouth every 6 (six) hours. Patient not taking: Reported on 02/22/2023 01/20/23   Prosperi, Christian H, PA-C  ondansetron (ZOFRAN-ODT) 4 MG disintegrating tablet Take 1 tablet (4 mg total) by mouth every 8 (eight) hours as needed for nausea or vomiting. Patient not taking: Reported on 03/07/2023 03/01/23   Laurence Spates, MD  OXYGEN Inhale 4-5 L into the lungs as needed (COPD).    [provider]  pantoprazole (PROTONIX) 40 MG tablet Take 1 tablet (40 mg total) by mouth daily. Patient not taking: Reported on 03/29/2023 03/17/23 04/16/23  Zigmund Daniel., MD  sucralfate (CARAFATE) 1 g tablet Take 1 tablet (1 g total) by mouth 4 (four) times daily -  with meals and at bedtime. Patient not taking: Reported on 02/22/2023 02/11/23   Horton, Mayer Masker, MD  tiZANidine (ZANAFLEX) 2 MG tablet Take 2 mg by mouth at bedtime. Patient not taking: Reported on 03/07/2023 02/19/23   [provider]  zonisamide (ZONEGRAN) 100 MG capsule Take 2 capsules (200 mg total) by mouth daily. Patient not taking: Reported on 02/22/2023 01/12/23   Lewie Chamber, MD                                                                                                                                    Allergies Iron dextran, Aspirin, and Penicillins  Review of Systems Review of Systems As noted in HPI  Physical Exam Vital Signs  I have reviewed the triage vital signs BP (!) 165/97 (BP Location: Right Arm)   Pulse (!) 117   Temp 97.7 F (36.5 C) (Oral)   Resp (!) 32   Ht 5\' 2"  (1.575 m)   Wt 54.4 kg   LMP 01/08/2011   SpO2 95%   BMI 21.95 kg/m   Physical Exam  Vitals reviewed.  Constitutional:      General: She is not in acute  distress.    Appearance: She is well-developed. She is not diaphoretic.  HENT:     Head: Normocephalic and atraumatic.     Nose: Nose normal.  Eyes:     General: No scleral icterus.       Right eye: No discharge.        Left eye: No discharge.     Conjunctiva/sclera: Conjunctivae normal.     Pupils: Pupils are equal, round, and reactive to light.  Cardiovascular:     Rate and Rhythm: Regular rhythm. Tachycardia present.     Heart sounds: No murmur heard.    No friction rub. No gallop.  Pulmonary:     Effort: Pulmonary effort is normal. No respiratory distress.     Breath sounds: Normal breath sounds. No stridor. No rales.  Abdominal:     General: There is no distension.     Palpations: Abdomen is soft.     Tenderness: There is no abdominal tenderness.  Musculoskeletal:        General: No tenderness.     Cervical back: Normal range of motion and neck supple.  Skin:    General: Skin is warm and dry.     Findings: No erythema or rash.  Neurological:     Mental Status: She is alert and oriented to person, place, and time.     ED Results and Treatments Labs (all labs ordered are listed, but only abnormal results are displayed) Labs Reviewed  CBC WITH DIFFERENTIAL/PLATELET - Abnormal; Notable for the following components:      Result Value   RBC 3.56 (*)    Hemoglobin 8.4 (*)    HCT 28.2 (*)    MCV 79.2 (*)    MCH 23.6 (*)    MCHC 29.8 (*)    RDW 21.2 (*)    All other components within normal limits  COMPREHENSIVE METABOLIC PANEL - Abnormal; Notable for the following components:   CO2 21 (*)    Glucose, Bld 321 (*)    Calcium 8.5 (*)    All other components within normal limits  RAPID URINE DRUG SCREEN, HOSP PERFORMED  ETHANOL                                                                                                                         EKG  EKG Interpretation Date/Time:  Monday March 30 2023 03:25:08 EDT Ventricular Rate:  126 PR Interval:  128 QRS  Duration:  86 QT Interval:  297 QTC Calculation: 430 R Axis:   14  Text Interpretation: Sinus tachycardia Ventricular bigeminy LAE, consider biatrial enlargement Low voltage, precordial leads No significant change was found Confirmed by Drema Pry [phone]) on 03/30/2023 5:29:34 AM       Radiology DG Chest Port 1 View  Result Date: 03/30/2023 CLINICAL DATA:  57 year old female with history of shortness of breath. EXAM: PORTABLE CHEST 1 VIEW  COMPARISON:  Chest x-ray 03/25/2023. FINDINGS: Lung volumes are normal. No consolidative airspace disease. No pleural effusions. No pneumothorax. No pulmonary nodule or mass noted. Pulmonary vasculature and the cardiomediastinal silhouette are within normal limits. IMPRESSION: No radiographic evidence of acute cardiopulmonary disease. Electronically Signed   By: Trudie Reed M.D.   On: 03/30/2023 05:28    Medications Ordered in ED Medications  diazepam (VALIUM) tablet 2 mg (2 mg Oral Given 03/30/23 0544)   Procedures Procedures  (including critical care time) Medical Decision Making / ED Course   Medical Decision Making Amount and/or Complexity of Data Reviewed Labs: ordered. Decision-making details documented in ED Course. Radiology: ordered and independent interpretation performed. Decision-making details documented in ED Course.  Risk Prescription drug management.    Workup for differential diagnosis listed below.  On exam lungs are clear to auscultation bilaterally with good air movement.  No need for additional treatments at this time.  Satting well on room air. CBC without leukocytosis.  Stable hemoglobin Metabolic panel without significant electrolyte derangements or renal sufficiency UDS obtained and ruled out amphetamine or cocaine use that would be contributing to her tachycardia. Chest x-ray without evidence of pneumonia, pneumothorax, pulmonary edema pleural effusions. Patient has had a recent CTA negative for PEs in August  with similar tachycardic heart rate.  Given the lack of hypoxia, I feel that PE is unlikely.  Patient did report using albuterol inhaler frequently and feel that her tachycardia may likely be related to frequent albuterol use. Tachycardia trending down    Final Clinical Impression(s) / ED Diagnoses Final diagnoses:  SOB (shortness of breath)   The patient appears reasonably screened and/or stabilized for discharge and I doubt any other medical condition or other [facility] requiring further screening, evaluation, or treatment in the ED at this time. I have discussed the findings, Dx and Tx plan with the patient/family who expressed understanding and agree(s) with the plan. Discharge instructions discussed at length. The patient/family was given strict return precautions who verbalized understanding of the instructions. No further questions at time of discharge.  Disposition: Discharge  Condition: Good  ED Discharge Orders     None        Follow Up: Fleet Contras, MD [address] [place] [address] [phone]  Call  to schedule an appointment for close follow up    This chart was dictated using voice recognition software.  Despite best efforts to proofread,  errors can occur which can change the documentation meaning.    Nira Conn, MD 03/30/23 [phone]

## 2023-03-30 NOTE — ED Triage Notes (Signed)
Pt BIBA with c/o SOB and increased anxiety. Pt very anxious during triage. Per medics hx of cocaine abuse  and with use while they were on the scene.

## 2023-03-31 ENCOUNTER — Emergency Department (HOSPITAL_COMMUNITY): Payer: 59

## 2023-03-31 ENCOUNTER — Other Ambulatory Visit: Payer: Self-pay

## 2023-03-31 ENCOUNTER — Emergency Department (HOSPITAL_COMMUNITY)
Admission: EM | Admit: 2023-03-31 | Discharge: 2023-03-31 | Disposition: A | Discharge status: Home / Self Care | Payer: 59 | Attending: Emergency Medicine | Admitting: Emergency Medicine

## 2023-03-31 DIAGNOSIS — D649 Anemia, unspecified: Secondary | ICD-10-CM | POA: Diagnosis not present

## 2023-03-31 DIAGNOSIS — R109 Unspecified abdominal pain: Secondary | ICD-10-CM | POA: Diagnosis not present

## 2023-03-31 DIAGNOSIS — J441 Chronic obstructive pulmonary disease with (acute) exacerbation: Secondary | ICD-10-CM | POA: Insufficient documentation

## 2023-03-31 DIAGNOSIS — R0602 Shortness of breath: Secondary | ICD-10-CM | POA: Insufficient documentation

## 2023-03-31 DIAGNOSIS — I499 Cardiac arrhythmia, unspecified: Secondary | ICD-10-CM | POA: Diagnosis not present

## 2023-03-31 DIAGNOSIS — I700 Atherosclerosis of aorta: Secondary | ICD-10-CM | POA: Diagnosis not present

## 2023-03-31 DIAGNOSIS — Z743 Need for continuous supervision: Secondary | ICD-10-CM | POA: Diagnosis not present

## 2023-03-31 DIAGNOSIS — J449 Chronic obstructive pulmonary disease, unspecified: Secondary | ICD-10-CM | POA: Diagnosis not present

## 2023-03-31 DIAGNOSIS — R062 Wheezing: Secondary | ICD-10-CM | POA: Diagnosis not present

## 2023-03-31 LAB — BASIC METABOLIC PANEL
Anion gap: 13 (ref 5–15)
BUN: <5 mg/dL — ABNORMAL LOW (ref 6–20)
CO2: 22 mmol/L (ref 22–32)
Calcium: 8.7 mg/dL — ABNORMAL LOW (ref 8.9–10.3)
Chloride: 99 mmol/L (ref 98–111)
Creatinine, Ser: 0.56 mg/dL (ref 0.44–1.00)
GFR, Estimated: >60 mL/min (ref >60)
Glucose, Bld: 234 mg/dL — ABNORMAL HIGH (ref 70–99)
Potassium: 4 mmol/L (ref 3.5–5.1)
Sodium: 134 mmol/L — ABNORMAL LOW (ref 135–145)

## 2023-03-31 LAB — CBC WITH DIFFERENTIAL/PLATELET
Abs Immature Granulocytes: 0.06 10*3/uL (ref 0.00–0.07)
Basophils Absolute: 0 10*3/uL (ref 0.0–0.1)
Basophils Relative: 0 %
Eosinophils Absolute: 0 10*3/uL (ref 0.0–0.5)
Eosinophils Relative: 0 %
HCT: 29.1 % — ABNORMAL LOW (ref 36.0–46.0)
Hemoglobin: 8.4 g/dL — ABNORMAL LOW (ref 12.0–15.0)
Immature Granulocytes: 1 %
Lymphocytes Relative: 9 %
Lymphs Abs: 0.9 10*3/uL (ref 0.7–4.0)
MCH: 22.6 pg — ABNORMAL LOW (ref 26.0–34.0)
MCHC: 28.9 g/dL — ABNORMAL LOW (ref 30.0–36.0)
MCV: 78.4 fL — ABNORMAL LOW (ref 80.0–100.0)
Monocytes Absolute: 0.8 10*3/uL (ref 0.1–1.0)
Monocytes Relative: 7 %
Neutro Abs: 9 10*3/uL — ABNORMAL HIGH (ref 1.7–7.7)
Neutrophils Relative %: 83 %
Platelets: 359 10*3/uL (ref 150–400)
RBC: 3.71 MIL/uL — ABNORMAL LOW (ref 3.87–5.11)
RDW: 20.9 % — ABNORMAL HIGH (ref 11.5–15.5)
WBC: 10.8 10*3/uL — ABNORMAL HIGH (ref 4.0–10.5)
nRBC: 0 % (ref 0.0–0.2)

## 2023-03-31 LAB — RAPID URINE DRUG SCREEN, HOSP PERFORMED
Amphetamines: NOT DETECTED
Barbiturates: NOT DETECTED
Benzodiazepines: POSITIVE — AB
Cocaine: NOT DETECTED
Opiates: NOT DETECTED
Tetrahydrocannabinol: NOT DETECTED

## 2023-03-31 LAB — ETHANOL: Alcohol, Ethyl (B): <10 mg/dL (ref <10)

## 2023-03-31 NOTE — ED Notes (Signed)
[place] sandwhich and coke provided per EDP.

## 2023-03-31 NOTE — ED Provider Triage Note (Signed)
Emergency Medicine Provider Triage Evaluation Note  [name] , a 57 y.o. female  was evaluated in triage.  Pt complains of sob that started tonight.  Hx of asthma.  Received a neb by EMS and has been using her inhaler.  Review of Systems  Positive: sob Negative: fever  Physical Exam  BP (!) 126/59 (BP Location: Right Arm)   Pulse (!) 129   Temp 98.4 F (36.9 C) (Oral)   Resp 20   LMP 01/08/2011   SpO2 97%  Gen:   Awake, no distress   Resp:  Normal effort  MSK:   Moves extremities without difficulty  Other:  Tachycardic, thought 2/2 albuterol, lung sounds are clear  Medical Decision Making  Medically screening exam initiated at 5:57 AM.  Appropriate orders placed.  [name] was informed that the remainder of the evaluation will be completed by another provider, this initial triage assessment does not replace that evaluation, and the importance of remaining in the ED until their evaluation is complete.     Roxy Horseman, PA-C 03/31/23 [phone]

## 2023-03-31 NOTE — ED Provider Notes (Signed)
[place] [facility]PARTMENT AT [facility] Provider Note   CSN: [id number] Arrival date & time: 03/31/23  0539     History  Chief Complaint  Patient presents with   Shortness of Breath    [name] is a 57 y.o. female.  57 year old female presents today for concern of shortness of breath, as well as abdominal discomfort.  Patient is well-known to the emergency department has been seen multiple times for similar complaints.  Patient has had 57 visits in the past 6 months.  She states her symptoms started just prior to arrival.  She denies any chest pain.  Does have history of COPD.  She denies nausea, vomiting, diarrhea, or any other GI symptoms.  The history is provided by the patient. No language interpreter was used.       Home Medications Prior to Admission medications   Medication Sig Start Date End Date Taking? Authorizing Provider  acetaminophen (TYLENOL) 325 MG tablet Take 650 mg by mouth every 6 (six) hours as needed for mild pain. Patient not taking: Reported on 03/29/2023    [provider]  albuterol (VENTOLIN HFA) 108 (90 Base) MCG/ACT inhaler Inhale 1-2 puffs into the lungs every 6 (six) hours as needed for wheezing or shortness of breath. Patient not taking: Reported on 03/29/2023 02/25/23   Sabas Sous, MD  albuterol (VENTOLIN HFA) 108 (90 Base) MCG/ACT inhaler Inhale 1-2 puffs into the lungs every 6 (six) hours as needed for wheezing or shortness of breath. Patient not taking: Reported on 03/29/2023 03/18/23   Palumbo, April, MD  ARIPiprazole (ABILIFY) 10 MG tablet Take 1 tablet (10 mg total) by mouth daily. Follow up with psychiatry outpatient for medication refills and adjustments Patient not taking: Reported on 03/29/2023 03/16/23 04/15/23  Zigmund Daniel., MD  budesonide-formoterol [facility]) 160-4.5 MCG/ACT inhaler Inhale 2 puffs into the lungs 2 (two) times daily. Patient not taking: Reported on 03/29/2023 02/25/23   Sabas Sous, MD  carbamazepine (TEGRETOL) 200 MG tablet 800mg  PO QD X 1D, then 600mg  PO QD X 1D, then 400mg  QD X 1D, then 200mg  PO QD X 2D Patient not taking: Reported on 03/29/2023 03/18/23   Palumbo, April, MD  cyanocobalamin (VITAMIN B12) 1000 MCG tablet Take 1 tablet (1,000 mcg total) by mouth daily. Patient not taking: Reported on 03/29/2023 03/16/23 07/24/23  Zigmund Daniel., MD  diclofenac Sodium (VOLTAREN) 1 % GEL Apply 2 g topically 4 (four) times daily. Patient not taking: Reported on 03/07/2023 02/19/23   [provider]  famotidine (PEPCID) 20 MG tablet Take 1 tablet (20 mg total) by mouth daily. Patient not taking: Reported on 03/07/2023 02/25/23   Sabas Sous, MD  ferrous sulfate 325 (65 FE) MG tablet Take 1 tablet (325 mg total) by mouth every other day. Patient not taking: Reported on 03/29/2023 03/18/23 10/02/23  Zigmund Daniel., MD  folic acid (FOLVITE) 1 MG tablet Take 1 tablet (1 mg total) by mouth daily. Patient not taking: Reported on 03/30/2023 03/17/23 05/16/23  Zigmund Daniel., MD  furosemide (LASIX) 20 MG tablet Take 1 tablet (20 mg total) by mouth daily. Patient not taking: Reported on 03/07/2023 02/24/23   Achille Rich, PA-C  gabapentin (NEURONTIN) 100 MG capsule Take 100 mg by mouth 3 (three) times daily. Patient not taking: Reported on 03/07/2023 02/19/23   [provider]  hyoscyamine (LEVSIN/SL) 0.125 MG SL tablet Place 1 tablet (0.125 mg total) under the tongue every  4 (four) hours as needed (pain). Up to 1.25 mg daily Patient not taking: Reported on 02/22/2023 01/23/23   Arthor Captain, PA-C  metFORMIN (GLUCOPHAGE-XR) 750 MG 24 hr tablet Take 750 mg by mouth daily. Patient not taking: Reported on 03/29/2023 03/23/23   [provider]  nicotine (NICODERM CQ - DOSED IN MG/24 HOURS) 21 mg/24hr patch Place 1 patch (21 mg total) onto the skin daily. Patient not taking: Reported on 03/29/2023 03/16/23   Zigmund Daniel., MD  nicotine polacrilex  (NICORETTE) 2 MG gum Take 1 each (2 mg total) by mouth as needed for smoking cessation. Patient not taking: Reported on 03/29/2023 03/16/23   Zigmund Daniel., MD  ondansetron (ZOFRAN) 4 MG tablet Take 1 tablet (4 mg total) by mouth every 6 (six) hours. Patient not taking: Reported on 02/22/2023 01/20/23   Prosperi, Christian H, PA-C  ondansetron (ZOFRAN-ODT) 4 MG disintegrating tablet Take 1 tablet (4 mg total) by mouth every 8 (eight) hours as needed for nausea or vomiting. Patient not taking: Reported on 03/07/2023 03/01/23   Laurence Spates, MD  OXYGEN Inhale 4-5 L into the lungs as needed (COPD).    [provider]  pantoprazole (PROTONIX) 40 MG tablet Take 1 tablet (40 mg total) by mouth daily. Patient not taking: Reported on 03/29/2023 03/17/23 04/16/23  Zigmund Daniel., MD  sucralfate (CARAFATE) 1 g tablet Take 1 tablet (1 g total) by mouth 4 (four) times daily -  with meals and at bedtime. Patient not taking: Reported on 02/22/2023 02/11/23   Horton, Mayer Masker, MD  tiZANidine (ZANAFLEX) 2 MG tablet Take 2 mg by mouth at bedtime. Patient not taking: Reported on 03/07/2023 02/19/23   [provider]  zonisamide (ZONEGRAN) 100 MG capsule Take 2 capsules (200 mg total) by mouth daily. Patient not taking: Reported on 02/22/2023 01/12/23   Lewie Chamber, MD      Allergies    Iron dextran, Aspirin, and Penicillins    Review of Systems   Review of Systems  Constitutional:  Negative for chills and fever.  Respiratory:  Positive for shortness of breath. Negative for cough.   Cardiovascular:  Negative for chest pain, palpitations and leg swelling.  Gastrointestinal:  Positive for abdominal pain. Negative for diarrhea, nausea and vomiting.  Genitourinary:  Negative for dysuria.  Neurological:  Negative for light-headedness.  All other systems reviewed and are negative.   Physical Exam Updated Vital Signs BP (!) 126/59 (BP Location: Right Arm)   Pulse (!) 129   Temp  98.4 F (36.9 C) (Oral)   Resp 20   LMP 01/08/2011   SpO2 97%  Physical Exam Vitals and nursing note reviewed.  Constitutional:      General: She is not in acute distress.    Appearance: Normal appearance. She is not ill-appearing.  HENT:     Head: Normocephalic and atraumatic.     Nose: Nose normal.  Eyes:     Conjunctiva/sclera: Conjunctivae normal.  Cardiovascular:     Rate and Rhythm: Regular rhythm. Tachycardia present.  Pulmonary:     Effort: Pulmonary effort is normal. No respiratory distress.     Breath sounds: Normal breath sounds. No wheezing.  Abdominal:     General: There is no distension.     Palpations: Abdomen is soft.     Tenderness: There is no abdominal tenderness. There is no guarding.  Musculoskeletal:        General: No deformity. Normal range of motion.  Cervical back: Normal range of motion.  Skin:    Findings: No rash.  Neurological:     Mental Status: She is alert.     ED Results / Procedures / Treatments   Labs (all labs ordered are listed, but only abnormal results are displayed) Labs Reviewed  CBC WITH DIFFERENTIAL/PLATELET - Abnormal; Notable for the following components:      Result Value   WBC 10.8 (*)    RBC 3.71 (*)    Hemoglobin 8.4 (*)    HCT 29.1 (*)    MCV 78.4 (*)    MCH 22.6 (*)    MCHC 28.9 (*)    RDW 20.9 (*)    Neutro Abs 9.0 (*)    All other components within normal limits  BASIC METABOLIC PANEL - Abnormal; Notable for the following components:   Sodium 134 (*)    Glucose, Bld 234 (*)    BUN <5 (*)    Calcium 8.7 (*)    All other components within normal limits  RAPID URINE DRUG SCREEN, HOSP PERFORMED - Abnormal; Notable for the following components:   Benzodiazepines POSITIVE (*)    All other components within normal limits  ETHANOL    EKG EKG Interpretation Date/Time:  Tuesday March 31 2023 06:23:13 EDT Ventricular Rate:  124 PR Interval:  130 QRS Duration:  87 QT Interval:  301 QTC  Calculation: 433 R Axis:   24  Text Interpretation: Sinus tachycardia Ventricular premature complex Aberrant conduction of SV complex(es) LAE, consider biatrial enlargement No significant change since last tracing Confirmed by Jacalyn Lefevre [phone]) on 03/31/2023 7:01:06 AM  Radiology DG Chest 2 View  Result Date: 03/31/2023 CLINICAL DATA:  57 year old female with shortness of breath.  COPD. EXAM: CHEST - 2 VIEW COMPARISON:  Chest CTA 02/14/2023. Portable chest 03/30/2023 and earlier. FINDINGS: PA and lateral views at 0716 hours. Lung volumes and mediastinal contours are stable and within normal limits. Mild kyphoscoliosis. Multiple stable appearing thoracic compression fractures on the lateral view. Abdominal Calcified aortic atherosclerosis. Negative visible bowel gas. Lung markings appear stable since last month. No pneumothorax, pulmonary edema, pleural effusion or confluent lung opacity. IMPRESSION: 1. No acute cardiopulmonary abnormality. 2. Aortic Atherosclerosis (ICD10-I70.0). Thoracic compression fractures. Electronically Signed   By: Odessa Fleming M.D.   On: 03/31/2023 07:54   DG Chest Port 1 View  Result Date: 03/30/2023 CLINICAL DATA:  58 year old female with history of shortness of breath. EXAM: PORTABLE CHEST 1 VIEW COMPARISON:  Chest x-ray 03/25/2023. FINDINGS: Lung volumes are normal. No consolidative airspace disease. No pleural effusions. No pneumothorax. No pulmonary nodule or mass noted. Pulmonary vasculature and the cardiomediastinal silhouette are within normal limits. IMPRESSION: No radiographic evidence of acute cardiopulmonary disease. Electronically Signed   By: Trudie Reed M.D.   On: 03/30/2023 05:28    Procedures Procedures    Medications Ordered in ED Medications - No data to display  ED Course/ Medical Decision Making/ A&P                                 Medical Decision Making Amount and/or Complexity of Data Reviewed Radiology: ordered.   57 year old  female presents today for concern of shortness of breath.  She states symptoms were just prior to arrival.  She is overall well-appearing but tachycardic.  She appears to be tachycardic at baseline but also had multiple rounds of albuterol which could be contributing to this.  Abdominal  exam is benign and with no evidence of acute abdomen.  No nausea, vomiting, dysuria, diarrhea or other GI symptoms.  No wheezing on exam.  She denies chest pain.  Workup overall reassuring.  CBC shows mild leukocytosis.  Hemoglobin is at patient's baseline at 8.4.  Glucose 234 otherwise no acute findings on BMP.  Renal function is at baseline.  UDS positive for benzodiazepine.  Chest x-ray without acute cardiopulmonary process.  EKG without acute ischemic changes.  Low suspicion for ACS.  Patient is eager for discharge.  Symptoms significantly improved.  She was discharged in stable condition.   Final Clinical Impression(s) / ED Diagnoses Final diagnoses:  Shortness of breath  COPD exacerbation [facility])    Rx / DC Orders ED Discharge Orders     None         Marita Kansas, PA-C 03/31/23 0850    Jacalyn Lefevre, MD 03/31/23 [phone]

## 2023-03-31 NOTE — ED Triage Notes (Signed)
Pt bibgcems from home with c/o sob, pt used inhaler and there was no improvement. 5mg  albuterol and .5mg  Atrovent with ems and there was improvement with breathing. Pt has hx copd  Bp- 136/66 Hr- 124 100%

## 2023-03-31 NOTE — Discharge Instructions (Signed)
Your workup today is reassuring.  No concerning findings on your workup.  Follow-up with your primary care provider.  Return for any concerning symptoms.

## 2023-04-01 ENCOUNTER — Encounter (HOSPITAL_COMMUNITY): Payer: Self-pay

## 2023-04-01 ENCOUNTER — Emergency Department (HOSPITAL_COMMUNITY)
Admission: EM | Admit: 2023-04-01 | Discharge: 2023-04-01 | Disposition: A | Discharge status: Home / Self Care | Payer: 59 | Attending: Emergency Medicine | Admitting: Emergency Medicine

## 2023-04-01 ENCOUNTER — Other Ambulatory Visit: Payer: Self-pay

## 2023-04-01 ENCOUNTER — Emergency Department (HOSPITAL_COMMUNITY): Payer: 59

## 2023-04-01 DIAGNOSIS — J449 Chronic obstructive pulmonary disease, unspecified: Secondary | ICD-10-CM | POA: Diagnosis not present

## 2023-04-01 DIAGNOSIS — I10 Essential (primary) hypertension: Secondary | ICD-10-CM | POA: Diagnosis not present

## 2023-04-01 DIAGNOSIS — I499 Cardiac arrhythmia, unspecified: Secondary | ICD-10-CM | POA: Diagnosis not present

## 2023-04-01 DIAGNOSIS — J441 Chronic obstructive pulmonary disease with (acute) exacerbation: Secondary | ICD-10-CM | POA: Diagnosis not present

## 2023-04-01 DIAGNOSIS — R0602 Shortness of breath: Secondary | ICD-10-CM | POA: Diagnosis not present

## 2023-04-01 DIAGNOSIS — E039 Hypothyroidism, unspecified: Secondary | ICD-10-CM | POA: Diagnosis not present

## 2023-04-01 DIAGNOSIS — E1140 Type 2 diabetes mellitus with diabetic neuropathy, unspecified: Secondary | ICD-10-CM | POA: Insufficient documentation

## 2023-04-01 DIAGNOSIS — F17210 Nicotine dependence, cigarettes, uncomplicated: Secondary | ICD-10-CM | POA: Diagnosis not present

## 2023-04-01 DIAGNOSIS — R079 Chest pain, unspecified: Secondary | ICD-10-CM | POA: Diagnosis not present

## 2023-04-01 DIAGNOSIS — R069 Unspecified abnormalities of breathing: Secondary | ICD-10-CM | POA: Diagnosis not present

## 2023-04-01 DIAGNOSIS — Z743 Need for continuous supervision: Secondary | ICD-10-CM | POA: Diagnosis not present

## 2023-04-01 MED ORDER — ALBUTEROL SULFATE HFA 108 (90 BASE) MCG/ACT IN AERS
1.0000 | INHALATION_SPRAY | Freq: Four times a day (QID) | RESPIRATORY_TRACT | 0 refills | Status: DC | PRN
Start: 2023-04-01 — End: 2023-07-15

## 2023-04-01 MED ORDER — ALBUTEROL SULFATE HFA 108 (90 BASE) MCG/ACT IN AERS
1.0000 | INHALATION_SPRAY | Freq: Once | RESPIRATORY_TRACT | Status: AC
  Administered 2023-04-01: 1 via RESPIRATORY_TRACT
  Filled 2023-04-01: qty 6.7

## 2023-04-01 NOTE — ED Triage Notes (Signed)
BIBM from home d/t SOB. Dim and wheezing for medics. EMS went to pt's house 4 times in the last 24 hours. Given duoneb and solumedrol 125mg  IV. 22g R wrist. Hx COPD.

## 2023-04-01 NOTE — ED Provider Notes (Addendum)
[place] EMERGENCY DEPARTMENT AT [facility] Provider Note  CSN: [id number] Arrival date & time: 04/01/23 1817  Chief Complaint(s) Shortness of Breath  HPI [name] is a 57 y.o. female with history of COPD, diabetes, schizoaffective disorder presenting to the emergency department with shortness of breath.  Patient reports this began today when she ran out of her inhaler.  She apparently called the paramedics 4 times today and they finally just took her to the ER.  She was given Solu-Medrol and DuoNeb.  She reports she feels normal now.  She request inhaler and wants to go home.  No cough, fevers or chills, abdominal pain, back pain, leg swelling, recent travel or surgeries, chest pain, really any other symptoms.   Past Medical History Past Medical History:  Diagnosis Date   Abdominal pain    Accidental drug overdose April 2013   Anxiety    Atrial fibrillation (HCC) 09/29/11   converted spontaneously   Chronic back pain    Chronic knee pain    Chronic nausea    Chronic pain    COPD (chronic obstructive pulmonary disease) (HCC)    Depression    Diabetes mellitus    states her doctor took her off all DM meds in past month   Diabetic neuropathy (HCC)    Dyspnea    with exertion    GERD (gastroesophageal reflux disease)    Headache(784.0)    migraines    HTN (hypertension)    not on meds since in a year    Hyperlipidemia    Hypothyroidism    not on meds in a while    Mental disorder    Bipolar and schizophrenic   Nausea and vomiting 01/02/2023   Requires supplemental oxygen    as needed per patient    Schizophrenia (HCC)    Schizophrenia, acute (HCC) 11/13/2017   Tobacco abuse    Patient Active Problem List   Diagnosis Date Noted   Acute on chronic anemia 03/13/2023   B12 deficiency 03/13/2023   Hyponatremia 02/22/2023   Chronic alcohol use 02/22/2023   Continuous dependence on cigarette smoking 02/22/2023   Alcohol use disorder, severe, dependence  (HCC) 01/03/2023   Suicidal ideation 01/03/2023   Abnormal weight loss 01/02/2023   Nausea and vomiting 01/02/2023   Hyperglycemia due to type 2 diabetes mellitus (HCC) 01/02/2023   History of colonic polyps 01/02/2023   Acute on chronic respiratory failure with hypoxia (HCC) 12/11/2022   Pulmonary nodules 12/11/2022   Type 2 diabetes mellitus without complications (HCC) 12/11/2022   Malnutrition of moderate degree 11/22/2022   Withdrawal symptoms, alcohol (HCC) 11/20/2022   CAP (community acquired pneumonia) 11/20/2022   Closed fracture of right zygomaticomaxillary complex (HCC) 08/26/2022   Blunt trauma 08/26/2022   Alcohol withdrawal (HCC) 08/25/2022   Seizure-like activity (HCC) 08/23/2022   Cocaine abuse (HCC) 08/23/2022   Closed fracture of left proximal humerus 07/08/2022   Homicidal ideation 07/07/2022   Aggressive behavior 07/05/2022   Hyperbilirubinemia 06/16/2022   Pressure injury of skin 06/16/2022   Pulmonary embolism (HCC) 05/19/2022   Postoperative wound infection 05/04/2022   Thrombocytosis 05/04/2022   Seizure (HCC) 04/17/2022   Memory loss 04/17/2022   SOB (shortness of breath) 04/05/2022   Abnormal EEG 02/12/2022   Constipation 02/12/2022   Vitamin A deficiency 02/12/2022   Vitamin D deficiency 02/12/2022   Vitamin C deficiency 02/12/2022   Iron deficiency anemia 02/12/2022   GERD (gastroesophageal reflux disease) 02/12/2022   Tobacco abuse 02/12/2022  Protein-calorie malnutrition, severe 01/14/2022   Acute respiratory failure with hypoxia and hypercapnia (HCC) 11/14/2021   Anemia 11/14/2021   Alcohol abuse with intoxication (HCC) 11/12/2021   Dysphagia 05/28/2021   Nasal septal perforation 05/28/2021   Referred otalgia of left ear 05/28/2021   Oral pain 05/28/2021   Encephalopathy 10/10/2020   Polysubstance (excluding opioids) dependence, daily use (HCC) 08/04/2018   Substance induced mood disorder (HCC) 08/03/2018   Schizoaffective disorder,  bipolar type (HCC) 11/13/2017   Polysubstance abuse (HCC) 11/13/2017   Type 2 diabetes mellitus (HCC) 09/06/2015   Right knee pain 01/05/2015   Muscular deconditioning 09/08/2014   Primary insomnia 09/08/2014   Encounter for monitoring opioid maintenance therapy 07/10/2014   Myofascial pain syndrome 12/09/2013   DDD (degenerative disc disease), lumbosacral 08/12/2013   Low back pain 08/12/2013   Lumbosacral spondylosis 04/13/2013   Primary localized osteoarthrosis, lower leg 12/14/2012   Chronic pain 11/27/2012   Hypothyroidism 11/27/2012   Sacroiliitis (HCC) 11/27/2012   Pain in joint involving lower leg 11/27/2012   Essential hypertension 11/04/2011   Paroxysmal atrial fibrillation with RVR (HCC) 09/30/2011   COPD (chronic obstructive pulmonary disease) (HCC) 09/29/2011   Home Medication(s) Prior to Admission medications   Medication Sig Start Date End Date Taking? Authorizing Provider  acetaminophen (TYLENOL) 325 MG tablet Take 650 mg by mouth every 6 (six) hours as needed for mild pain. Patient not taking: Reported on 03/29/2023    [provider]  albuterol (VENTOLIN HFA) 108 (90 Base) MCG/ACT inhaler Inhale 1-2 puffs into the lungs every 6 (six) hours as needed for wheezing or shortness of breath. Patient not taking: Reported on 03/29/2023 02/25/23   Sabas Sous, MD  albuterol (VENTOLIN HFA) 108 (90 Base) MCG/ACT inhaler Inhale 1-2 puffs into the lungs every 6 (six) hours as needed for wheezing or shortness of breath. Patient not taking: Reported on 03/29/2023 03/18/23   Palumbo, April, MD  ARIPiprazole (ABILIFY) 10 MG tablet Take 1 tablet (10 mg total) by mouth daily. Follow up with psychiatry outpatient for medication refills and adjustments Patient not taking: Reported on 03/29/2023 03/16/23 04/15/23  Zigmund Daniel., MD  budesonide-formoterol [facility]) 160-4.5 MCG/ACT inhaler Inhale 2 puffs into the lungs 2 (two) times daily. Patient not taking: Reported on  03/29/2023 02/25/23   Sabas Sous, MD  carbamazepine (TEGRETOL) 200 MG tablet 800mg  PO QD X 1D, then 600mg  PO QD X 1D, then 400mg  QD X 1D, then 200mg  PO QD X 2D Patient not taking: Reported on 03/29/2023 03/18/23   Palumbo, April, MD  cyanocobalamin (VITAMIN B12) 1000 MCG tablet Take 1 tablet (1,000 mcg total) by mouth daily. Patient not taking: Reported on 03/29/2023 03/16/23 07/24/23  Zigmund Daniel., MD  diclofenac Sodium (VOLTAREN) 1 % GEL Apply 2 g topically 4 (four) times daily. Patient not taking: Reported on 03/07/2023 02/19/23   [provider]  famotidine (PEPCID) 20 MG tablet Take 1 tablet (20 mg total) by mouth daily. Patient not taking: Reported on 03/07/2023 02/25/23   Sabas Sous, MD  ferrous sulfate 325 (65 FE) MG tablet Take 1 tablet (325 mg total) by mouth every other day. Patient not taking: Reported on 03/29/2023 03/18/23 10/02/23  Zigmund Daniel., MD  folic acid (FOLVITE) 1 MG tablet Take 1 tablet (1 mg total) by mouth daily. Patient not taking: Reported on 03/30/2023 03/17/23 05/16/23  Zigmund Daniel., MD  furosemide (LASIX) 20 MG tablet Take 1 tablet (20 mg total) by mouth  daily. Patient not taking: Reported on 03/07/2023 02/24/23   Achille Rich, PA-C  gabapentin (NEURONTIN) 100 MG capsule Take 100 mg by mouth 3 (three) times daily. Patient not taking: Reported on 03/07/2023 02/19/23   [provider]  hyoscyamine (LEVSIN/SL) 0.125 MG SL tablet Place 1 tablet (0.125 mg total) under the tongue every 4 (four) hours as needed (pain). Up to 1.25 mg daily Patient not taking: Reported on 02/22/2023 01/23/23   Arthor Captain, PA-C  metFORMIN (GLUCOPHAGE-XR) 750 MG 24 hr tablet Take 750 mg by mouth daily. Patient not taking: Reported on 03/29/2023 03/23/23   [provider]  nicotine (NICODERM CQ - DOSED IN MG/24 HOURS) 21 mg/24hr patch Place 1 patch (21 mg total) onto the skin daily. Patient not taking: Reported on 03/29/2023 03/16/23   Zigmund Daniel., MD  nicotine polacrilex (NICORETTE) 2 MG gum Take 1 each (2 mg total) by mouth as needed for smoking cessation. Patient not taking: Reported on 03/29/2023 03/16/23   Zigmund Daniel., MD  ondansetron (ZOFRAN) 4 MG tablet Take 1 tablet (4 mg total) by mouth every 6 (six) hours. Patient not taking: Reported on 02/22/2023 01/20/23   Prosperi, Christian H, PA-C  ondansetron (ZOFRAN-ODT) 4 MG disintegrating tablet Take 1 tablet (4 mg total) by mouth every 8 (eight) hours as needed for nausea or vomiting. Patient not taking: Reported on 03/07/2023 03/01/23   Laurence Spates, MD  OXYGEN Inhale 4-5 L into the lungs as needed (COPD).    [provider]  pantoprazole (PROTONIX) 40 MG tablet Take 1 tablet (40 mg total) by mouth daily. Patient not taking: Reported on 03/29/2023 03/17/23 04/16/23  Zigmund Daniel., MD  sucralfate (CARAFATE) 1 g tablet Take 1 tablet (1 g total) by mouth 4 (four) times daily -  with meals and at bedtime. Patient not taking: Reported on 02/22/2023 02/11/23   Horton, Mayer Masker, MD  tiZANidine (ZANAFLEX) 2 MG tablet Take 2 mg by mouth at bedtime. Patient not taking: Reported on 03/07/2023 02/19/23   [provider]  zonisamide (ZONEGRAN) 100 MG capsule Take 2 capsules (200 mg total) by mouth daily. Patient not taking: Reported on 02/22/2023 01/12/23   Lewie Chamber, MD                                                                                                                                    Past Surgical History Past Surgical History:  Procedure Laterality Date   ABDOMINAL HYSTERECTOMY     BLADDER SUSPENSION  03/04/2011   Procedure: [facility] PROCEDURE;  Surgeon: Jonna Coup MacDiarmid;  Location: [facility] ORS;  Service: Urology;  Laterality: N/A;   BOWEL RESECTION N/A 04/18/2022   Procedure: SMALL BOWEL RESECTION;  Surgeon: Harriette Bouillon, MD;  Location: [facility] OR;  Service: General;  Laterality: N/A;   CYSTOCELE REPAIR  03/04/2011   Procedure: ANTERIOR  REPAIR (CYSTOCELE);  Surgeon: Martina Sinner;  Location: [facility] ORS;  Service: Urology;  Laterality: N/A;   CYSTOSCOPY  03/04/2011   Procedure: CYSTOSCOPY;  Surgeon: Jonna Coup MacDiarmid;  Location: [facility];  Service: Urology;  Laterality: N/A;   ESOPHAGOGASTRODUODENOSCOPY (EGD) WITH PROPOFOL N/A 05/12/2017   Procedure: ESOPHAGOGASTRODUODENOSCOPY (EGD) WITH PROPOFOL;  Surgeon: Ovidio Kin, MD;  Location: [facility];  Service: General;  Laterality: N/A;   GASTRIC ROUX-EN-Y N/A 03/25/2016   Procedure: LAPAROSCOPIC ROUX-EN-Y GASTRIC BYPASS WITH UPPER ENDOSCOPY;  Surgeon: Glenna Fellows, MD;  Location: [facility] ORS;  Service: General;  Laterality: N/A;   KNEE SURGERY     LAPAROSCOPIC ASSISTED VAGINAL HYSTERECTOMY  03/04/2011   Procedure: LAPAROSCOPIC ASSISTED VAGINAL HYSTERECTOMY;  Surgeon: Jeani Hawking, MD;  Location: [facility];  Service: Gynecology;  Laterality: N/A;   LAPAROTOMY N/A 04/18/2022   Procedure: EXPLORATORY LAPAROTOMY;  Surgeon: Harriette Bouillon, MD;  Location: [facility] OR;  Service: General;  Laterality: N/A;   LAPAROTOMY N/A 04/24/2022   Procedure: BRING BACK EXPLORATORY LAPAROTOMY;  Surgeon: Violeta Gelinas, MD;  Location: [facility] OR;  Service: General;  Laterality: N/A;   TOTAL HIP ARTHROPLASTY Right 08/27/2022   Procedure: TOTAL HIP ARTHROPLASTY;  Surgeon: Joen Laura, MD;  Location: [facility] OR;  Service: Orthopedics;  Laterality: Right;   Family History Family History  Problem Relation Age of Onset   Heart attack Father        53s   Diabetes Mother    Heart disease Mother    Hypertension Mother    Heart attack Sister        64   COPD Other    Breast cancer Neg Hx     Social History Social History   Tobacco Use   Smoking status: Every Day    Current packs/day: 1.00    Types: Cigarettes   Smokeless tobacco: Never  Vaping Use   Vaping status: Never Used  Substance Use Topics   Alcohol use: Yes    Comment: 40 ounces daily   Drug use: Yes    Types: Cocaine, Marijuana,  "Crack" cocaine    Comment: last use- 03/19/2023   Allergies Iron dextran, Aspirin, and Penicillins  Review of Systems Review of Systems  All other systems reviewed and are negative.   Physical Exam Vital Signs  I have reviewed the triage vital signs BP 137/75 (BP Location: Right Arm)   Pulse (!) 102   Temp 98.1 F (36.7 C) (Oral)   Resp 18   LMP 01/08/2011   SpO2 97%  Physical Exam Vitals and nursing note reviewed.  Constitutional:      General: She is not in acute distress.    Appearance: She is well-developed.  HENT:     Head: Normocephalic and atraumatic.     Mouth/Throat:     Mouth: Mucous membranes are moist.  Eyes:     Pupils: Pupils are equal, round, and reactive to light.  Cardiovascular:     Rate and Rhythm: Normal rate and regular rhythm.     Heart sounds: No murmur heard. Pulmonary:     Effort: Pulmonary effort is normal. Tachypnea present. No respiratory distress.     Breath sounds: Normal breath sounds.  Abdominal:     General: Abdomen is flat.     Palpations: Abdomen is soft.     Tenderness: There is no abdominal tenderness.  Musculoskeletal:        General: No tenderness.     Right lower leg: No edema.     Left lower leg: No edema.  Skin:  General: Skin is warm and dry.  Neurological:     General: No focal deficit present.     Mental Status: She is alert. Mental status is at baseline.  Psychiatric:        Mood and Affect: Mood normal.        Behavior: Behavior normal.     ED Results and Treatments Labs (all labs ordered are listed, but only abnormal results are displayed) Labs Reviewed - No data to display                                                                                                                        Radiology No results found.  Pertinent labs & imaging results that were available during my care of the patient were reviewed by me and considered in my medical decision making (see MDM for details).  Medications  Ordered in ED Medications  albuterol (VENTOLIN HFA) 108 (90 Base) MCG/ACT inhaler 1 puff (1 puff Inhalation Given 04/01/23 1924)                                                                                                                                     Procedures Procedures  (including critical care time)  Medical Decision Making / ED Course   MDM:  57 year old female presenting to the emergency department with shortness of breath.  Patient overall well-appearing, her physical exam is normal other than some tachycardia.  Suspect this is most likely in setting of albuterol.  On review patient frequently has tachycardia.  She had her thyroid function checked as recently as 1 month ago and it was normal.  Doubt any other process like PE, no hypoxia patient feels at baseline after receiving her albuterol.  Doubt this needs further workup.  Chest x-ray reviewed by me and appears clear with no signs of pneumonia.  EKG tachycardic but no STEMI or other acute issues.  Will discharge patient. Advised PMD follow up. Will discharge patient to home. All questions answered. Patient comfortable with plan of discharge. Return precautions discussed with patient and specified on the after visit summary.       Additional history obtained: -Additional history obtained from ems -External records from outside source obtained and reviewed including: Chart review including previous notes, labs, imaging, consultation notes including numerous previous ER visits    Imaging Studies ordered: I ordered imaging studies including CXR  On my  interpretation imaging demonstrates no acute process Radiology interpretation pending   Medicines ordered and prescription drug management: Meds ordered this encounter  Medications   albuterol (VENTOLIN HFA) 108 (90 Base) MCG/ACT inhaler 1 puff    -I have reviewed the patients home medicines and have made adjustments as needed   Cardiac Monitoring: The patient  was maintained on a cardiac monitor.  I personally viewed and interpreted the cardiac monitored which showed an underlying rhythm of: sinus tachycardia   Social Determinants of Health:  Diagnosis or treatment significantly limited by social determinants of health: current smoker   Reevaluation: After the interventions noted above, I reevaluated the patient and found that their symptoms have resolved  Co morbidities that complicate the patient evaluation  Past Medical History:  Diagnosis Date   Abdominal pain    Accidental drug overdose April 2013   Anxiety    Atrial fibrillation (HCC) 09/29/11   converted spontaneously   Chronic back pain    Chronic knee pain    Chronic nausea    Chronic pain    COPD (chronic obstructive pulmonary disease) (HCC)    Depression    Diabetes mellitus    states her doctor took her off all DM meds in past month   Diabetic neuropathy (HCC)    Dyspnea    with exertion    GERD (gastroesophageal reflux disease)    Headache(784.0)    migraines    HTN (hypertension)    not on meds since in a year    Hyperlipidemia    Hypothyroidism    not on meds in a while    Mental disorder    Bipolar and schizophrenic   Nausea and vomiting 01/02/2023   Requires supplemental oxygen    as needed per patient    Schizophrenia (HCC)    Schizophrenia, acute (HCC) 11/13/2017   Tobacco abuse       Dispostion: Disposition decision including need for hospitalization was considered, and patient discharged from emergency department.    Final Clinical Impression(s) / ED Diagnoses Final diagnoses:  Chronic obstructive pulmonary disease with bronchospasm (HCC)     This chart was dictated using voice recognition software.  Despite best efforts to proofread,  errors can occur which can change the documentation meaning.    Lonell Grandchild, MD 04/01/23 1950    Lonell Grandchild, MD 04/01/23 [phone]

## 2023-04-02 ENCOUNTER — Encounter (HOSPITAL_COMMUNITY): Payer: Self-pay | Admitting: Emergency Medicine

## 2023-04-02 ENCOUNTER — Emergency Department (HOSPITAL_COMMUNITY): Payer: 59

## 2023-04-02 ENCOUNTER — Emergency Department (HOSPITAL_COMMUNITY)
Admission: EM | Admit: 2023-04-02 | Discharge: 2023-04-02 | Disposition: A | Discharge status: Home / Self Care | Payer: 59 | Source: Home / Self Care | Attending: Emergency Medicine | Admitting: Emergency Medicine

## 2023-04-02 ENCOUNTER — Other Ambulatory Visit: Payer: Self-pay

## 2023-04-02 ENCOUNTER — Emergency Department (HOSPITAL_COMMUNITY)
Admission: EM | Admit: 2023-04-02 | Discharge: 2023-04-02 | Disposition: A | Discharge status: Home / Self Care | Payer: 59 | Attending: Emergency Medicine | Admitting: Emergency Medicine

## 2023-04-02 DIAGNOSIS — Z743 Need for continuous supervision: Secondary | ICD-10-CM | POA: Diagnosis not present

## 2023-04-02 DIAGNOSIS — R739 Hyperglycemia, unspecified: Secondary | ICD-10-CM | POA: Diagnosis not present

## 2023-04-02 DIAGNOSIS — E871 Hypo-osmolality and hyponatremia: Secondary | ICD-10-CM | POA: Insufficient documentation

## 2023-04-02 DIAGNOSIS — Z7984 Long term (current) use of oral hypoglycemic drugs: Secondary | ICD-10-CM | POA: Insufficient documentation

## 2023-04-02 DIAGNOSIS — E119 Type 2 diabetes mellitus without complications: Secondary | ICD-10-CM | POA: Insufficient documentation

## 2023-04-02 DIAGNOSIS — Z7951 Long term (current) use of inhaled steroids: Secondary | ICD-10-CM | POA: Insufficient documentation

## 2023-04-02 DIAGNOSIS — D649 Anemia, unspecified: Secondary | ICD-10-CM | POA: Diagnosis not present

## 2023-04-02 DIAGNOSIS — R0602 Shortness of breath: Secondary | ICD-10-CM | POA: Diagnosis not present

## 2023-04-02 DIAGNOSIS — I959 Hypotension, unspecified: Secondary | ICD-10-CM | POA: Diagnosis not present

## 2023-04-02 DIAGNOSIS — J449 Chronic obstructive pulmonary disease, unspecified: Secondary | ICD-10-CM | POA: Insufficient documentation

## 2023-04-02 DIAGNOSIS — R0600 Dyspnea, unspecified: Secondary | ICD-10-CM | POA: Insufficient documentation

## 2023-04-02 DIAGNOSIS — I499 Cardiac arrhythmia, unspecified: Secondary | ICD-10-CM | POA: Diagnosis not present

## 2023-04-02 DIAGNOSIS — R6889 Other general symptoms and signs: Secondary | ICD-10-CM | POA: Diagnosis not present

## 2023-04-02 DIAGNOSIS — R110 Nausea: Secondary | ICD-10-CM | POA: Diagnosis not present

## 2023-04-02 LAB — CBC WITH DIFFERENTIAL/PLATELET
Abs Immature Granulocytes: 0.03 10*3/uL (ref 0.00–0.07)
Basophils Absolute: 0 10*3/uL (ref 0.0–0.1)
Basophils Relative: 0 %
Eosinophils Absolute: 0 10*3/uL (ref 0.0–0.5)
Eosinophils Relative: 0 %
HCT: 29.7 % — ABNORMAL LOW (ref 36.0–46.0)
Hemoglobin: 8.9 g/dL — ABNORMAL LOW (ref 12.0–15.0)
Immature Granulocytes: 0 %
Lymphocytes Relative: 9 %
Lymphs Abs: 0.7 10*3/uL (ref 0.7–4.0)
MCH: 23.6 pg — ABNORMAL LOW (ref 26.0–34.0)
MCHC: 30 g/dL (ref 30.0–36.0)
MCV: 78.8 fL — ABNORMAL LOW (ref 80.0–100.0)
Monocytes Absolute: 0.7 10*3/uL (ref 0.1–1.0)
Monocytes Relative: 8 %
Neutro Abs: 7.1 10*3/uL (ref 1.7–7.7)
Neutrophils Relative %: 83 %
Platelets: 286 10*3/uL (ref 150–400)
RBC: 3.77 MIL/uL — ABNORMAL LOW (ref 3.87–5.11)
RDW: 20.3 % — ABNORMAL HIGH (ref 11.5–15.5)
WBC: 8.5 10*3/uL (ref 4.0–10.5)
nRBC: 0 % (ref 0.0–0.2)

## 2023-04-02 LAB — BASIC METABOLIC PANEL
Anion gap: 11 (ref 5–15)
BUN: 10 mg/dL (ref 6–20)
CO2: 23 mmol/L (ref 22–32)
Calcium: 8.6 mg/dL — ABNORMAL LOW (ref 8.9–10.3)
Chloride: 94 mmol/L — ABNORMAL LOW (ref 98–111)
Creatinine, Ser: 0.55 mg/dL (ref 0.44–1.00)
GFR, Estimated: >60 mL/min (ref >60)
Glucose, Bld: 274 mg/dL — ABNORMAL HIGH (ref 70–99)
Potassium: 3.9 mmol/L (ref 3.5–5.1)
Sodium: 128 mmol/L — ABNORMAL LOW (ref 135–145)

## 2023-04-02 LAB — TROPONIN I (HIGH SENSITIVITY): Troponin I (High Sensitivity): 2 ng/L (ref <18)

## 2023-04-02 LAB — CBG MONITORING, ED
Glucose-Capillary: 301 mg/dL — ABNORMAL HIGH (ref 70–99)
Glucose-Capillary: 285 mg/dL — ABNORMAL HIGH (ref 70–99)

## 2023-04-02 MED ORDER — ALBUTEROL SULFATE HFA 108 (90 BASE) MCG/ACT IN AERS: 2.0000 | INHALATION_SPRAY | RESPIRATORY_TRACT | Status: DC | PRN

## 2023-04-02 NOTE — Discharge Instructions (Addendum)
Your sodium was a little low this morning.  Follow-up with your primary care doctor for further evaluation of this.

## 2023-04-02 NOTE — Discharge Instructions (Signed)
Your lab work today showed slightly low sodium.  I recommend you follow-up closely with your doctor tomorrow as scheduled.  If you develop worsening difficulty breathing, chest pain or any other new concerning symptoms you should return to the ED.

## 2023-04-02 NOTE — ED Triage Notes (Signed)
BIBA per EMS: pt took O2 off to go to bathroom & smoke a cigarette. Pt began feeling SHOB after that.  130/76 130 HR hx tachycardia  96% 2L  449 CBG hx diabetes not compliant w/ meds

## 2023-04-02 NOTE — ED Triage Notes (Signed)
Pt BIB EMS from home for [facility], smoked a pack of cigarettes since discharged today and ETOH. Pt is A&Ox4, pt was using inhaler upon EMS arrival. Pt has been seen multiple times this month for same. Pt reports that she is no longer having the [facility].

## 2023-04-02 NOTE — ED Notes (Signed)
Pt would like her daughter notified at discharge.

## 2023-04-02 NOTE — ED Provider Notes (Signed)
[place] EMERGENCY DEPARTMENT AT [facility] Provider Note   CSN: [id number] Arrival date & time: 04/02/23  2225     History  Chief Complaint  Patient presents with   Shortness of Breath    [name] is a 57 y.o. female.   Shortness of Breath Patient presented with shortness of breath.  States she is feeling better now.  Seen earlier today for the same.  Reviewed workup.  She has had 60 visits in the last 6 months.     Home Medications Prior to Admission medications   Medication Sig Start Date End Date Taking? Authorizing Provider  acetaminophen (TYLENOL) 325 MG tablet Take 650 mg by mouth every 6 (six) hours as needed for mild pain. Patient not taking: Reported on 03/29/2023    [provider]  albuterol (VENTOLIN HFA) 108 (90 Base) MCG/ACT inhaler Inhale 1-2 puffs into the lungs every 6 (six) hours as needed for wheezing or shortness of breath. 04/01/23   Lonell Grandchild, MD  ARIPiprazole (ABILIFY) 10 MG tablet Take 1 tablet (10 mg total) by mouth daily. Follow up with psychiatry outpatient for medication refills and adjustments Patient not taking: Reported on 03/29/2023 03/16/23 04/15/23  Zigmund Daniel., MD  budesonide-formoterol [facility]) 160-4.5 MCG/ACT inhaler Inhale 2 puffs into the lungs 2 (two) times daily. Patient not taking: Reported on 03/29/2023 02/25/23   Sabas Sous, MD  carbamazepine (TEGRETOL) 200 MG tablet 800mg  PO QD X 1D, then 600mg  PO QD X 1D, then 400mg  QD X 1D, then 200mg  PO QD X 2D Patient not taking: Reported on 03/29/2023 03/18/23   Palumbo, April, MD  cyanocobalamin (VITAMIN B12) 1000 MCG tablet Take 1 tablet (1,000 mcg total) by mouth daily. Patient not taking: Reported on 03/29/2023 03/16/23 07/24/23  Zigmund Daniel., MD  diclofenac Sodium (VOLTAREN) 1 % GEL Apply 2 g topically 4 (four) times daily. Patient not taking: Reported on 03/07/2023 02/19/23   [provider]  famotidine (PEPCID) 20 MG tablet  Take 1 tablet (20 mg total) by mouth daily. Patient not taking: Reported on 03/07/2023 02/25/23   Sabas Sous, MD  ferrous sulfate 325 (65 FE) MG tablet Take 1 tablet (325 mg total) by mouth every other day. Patient not taking: Reported on 03/29/2023 03/18/23 10/02/23  Zigmund Daniel., MD  folic acid (FOLVITE) 1 MG tablet Take 1 tablet (1 mg total) by mouth daily. Patient not taking: Reported on 03/30/2023 03/17/23 05/16/23  Zigmund Daniel., MD  furosemide (LASIX) 20 MG tablet Take 1 tablet (20 mg total) by mouth daily. Patient not taking: Reported on 03/07/2023 02/24/23   Achille Rich, PA-C  gabapentin (NEURONTIN) 100 MG capsule Take 100 mg by mouth 3 (three) times daily. Patient not taking: Reported on 03/07/2023 02/19/23   [provider]  hyoscyamine (LEVSIN/SL) 0.125 MG SL tablet Place 1 tablet (0.125 mg total) under the tongue every 4 (four) hours as needed (pain). Up to 1.25 mg daily Patient not taking: Reported on 02/22/2023 01/23/23   Arthor Captain, PA-C  metFORMIN (GLUCOPHAGE-XR) 750 MG 24 hr tablet Take 750 mg by mouth daily. Patient not taking: Reported on 03/29/2023 03/23/23   [provider]  nicotine (NICODERM CQ - DOSED IN MG/24 HOURS) 21 mg/24hr patch Place 1 patch (21 mg total) onto the skin daily. Patient not taking: Reported on 03/29/2023 03/16/23   Zigmund Daniel., MD  nicotine polacrilex (NICORETTE) 2 MG gum Take 1 each (2 mg  total) by mouth as needed for smoking cessation. Patient not taking: Reported on 03/29/2023 03/16/23   Zigmund Daniel., MD  ondansetron (ZOFRAN) 4 MG tablet Take 1 tablet (4 mg total) by mouth every 6 (six) hours. Patient not taking: Reported on 02/22/2023 01/20/23   Prosperi, Christian H, PA-C  ondansetron (ZOFRAN-ODT) 4 MG disintegrating tablet Take 1 tablet (4 mg total) by mouth every 8 (eight) hours as needed for nausea or vomiting. Patient not taking: Reported on 03/07/2023 03/01/23   Laurence Spates, MD  OXYGEN Inhale  4-5 L into the lungs as needed (COPD).    [provider]  pantoprazole (PROTONIX) 40 MG tablet Take 1 tablet (40 mg total) by mouth daily. Patient not taking: Reported on 03/29/2023 03/17/23 04/16/23  Zigmund Daniel., MD  sucralfate (CARAFATE) 1 g tablet Take 1 tablet (1 g total) by mouth 4 (four) times daily -  with meals and at bedtime. Patient not taking: Reported on 02/22/2023 02/11/23   Horton, Mayer Masker, MD  tiZANidine (ZANAFLEX) 2 MG tablet Take 2 mg by mouth at bedtime. Patient not taking: Reported on 03/07/2023 02/19/23   [provider]  zonisamide (ZONEGRAN) 100 MG capsule Take 2 capsules (200 mg total) by mouth daily. Patient not taking: Reported on 02/22/2023 01/12/23   Lewie Chamber, MD      Allergies    Iron dextran, Aspirin, and Penicillins    Review of Systems   Review of Systems  Respiratory:  Positive for shortness of breath.     Physical Exam Updated Vital Signs BP 116/63 (BP Location: Right Arm)   Pulse (!) 118   Temp 98.3 F (36.8 C) (Oral)   Resp (!) 22   LMP 01/08/2011   SpO2 100%  Physical Exam Vitals reviewed.  Cardiovascular:     Rate and Rhythm: Normal rate.  Pulmonary:     Breath sounds: Normal breath sounds. No wheezing or rhonchi.  Chest:     Chest wall: No tenderness.  Musculoskeletal:     Right lower leg: No edema.     Left lower leg: No edema.  Skin:    General: Skin is warm.  Neurological:     Mental Status: She is alert and oriented to person, place, and time.     ED Results / Procedures / Treatments   Labs (all labs ordered are listed, but only abnormal results are displayed) Labs Reviewed - No data to display  EKG None  Radiology DG Chest 2 View  Result Date: 04/02/2023 CLINICAL DATA:  57 year old female with shortness of breath. EXAM: CHEST - 2 VIEW COMPARISON:  Portable chest 04/01/2023 and earlier. FINDINGS: PA and lateral views at 0542 hours. Mediastinal contours are normal. Visualized tracheal air  column is within normal limits. Borderline to mildly hyperinflated lungs. Symmetric increased pulmonary interstitial opacity is mild, probably chronic. No pneumothorax, pulmonary edema, pleural effusion or confluent lung opacity. Mild kyphoscoliosis. Midthoracic compression fractures. Negative visible bowel gas. IMPRESSION: 1. Stable borderline to mild hyperinflation and nonspecific interstitial lung changes. No acute cardiopulmonary abnormality. 2. Nonacute thoracic compression fractures. Electronically Signed   By: Odessa Fleming M.D.   On: 04/02/2023 05:57   DG Chest Port 1 View  Result Date: 04/01/2023 CLINICAL DATA:  Chest pain and shortness of breath. History of asthma and COPD. EXAM: PORTABLE CHEST 1 VIEW COMPARISON:  03/31/2023. FINDINGS: The heart size and mediastinal contours are within normal limits. No consolidation, effusion, or pneumothorax. No acute osseous abnormality. IMPRESSION: No active  disease. Electronically Signed   By: Thornell Sartorius M.D.   On: 04/01/2023 21:06    Procedures Procedures    Medications Ordered in ED Medications - No data to display  ED Course/ Medical Decision Making/ A&P                                 Medical Decision Making   patient with dyspnea.  60 previous visits for same.  Reviewed blood work from earlier today.  Does have a hyponatremia of 128.  Has had some hyponatremia in the past.  Potentially due to some alcohol use.  Without complaints at this time.  Do not think we need further workup.  Can follow-up as an outpatient with her PCP although I think she is high risk to return.        Final Clinical Impression(s) / ED Diagnoses Final diagnoses:  Dyspnea, unspecified type  Hyponatremia    Rx / DC Orders ED Discharge Orders     None         Benjiman Core, MD 04/02/23 2241

## 2023-04-02 NOTE — ED Provider Notes (Signed)
[place] EMERGENCY DEPARTMENT AT [facility] Provider Note   CSN: [id number] Arrival date & time: 04/02/23  7829     History  Chief Complaint  Patient presents with   Shortness of Breath    RIKKI TROSPER is a 57 y.o. female.   Shortness of Breath 57 year old female history of COPD on baseline 2 4 L of oxygen, diabetes, schizoaffective disorder presented for shortness of breath.  She states has been short of breath since yesterday when she ran out of her inhaler.  She was seen in the ED yesterday.  She got a refill of her albuterol inhaler and was discharged home.  She was doing okay but was short of breath when she woke up this morning.  She called her PCP yesterday was try to get her nebulizer which supposed be delivered today.  I spoke with patient's daughter over the phone he states patient lives alone and is frequently ED.  Daughter is a Child psychotherapist is trying to get her more support due to some recent memory issues and has been working closely with her PCP.  Patient denies any chest pain, and feels back to her baseline now after receiving breathing treatments and steroids with EMS.  She did report she was hyperglycemic and is why she was told to come here.  Some subjective fevers, no cough.  No chest pain or abdominal pain.  No swelling in her legs or recent travel or history of DVT or PE as far she is aware.  She does states she drinks several large beers per day and has not drank about 24 hours.  No history of withdrawal but she does feel somewhat shaky.  She also has not eaten and she thinks that is contributing.     Home Medications Prior to Admission medications   Medication Sig Start Date End Date Taking? Authorizing Provider  acetaminophen (TYLENOL) 325 MG tablet Take 650 mg by mouth every 6 (six) hours as needed for mild pain. Patient not taking: Reported on 03/29/2023    [provider]  albuterol (VENTOLIN HFA) 108 (90 Base) MCG/ACT inhaler Inhale  1-2 puffs into the lungs every 6 (six) hours as needed for wheezing or shortness of breath. 04/01/23   Lonell Grandchild, MD  ARIPiprazole (ABILIFY) 10 MG tablet Take 1 tablet (10 mg total) by mouth daily. Follow up with psychiatry outpatient for medication refills and adjustments Patient not taking: Reported on 03/29/2023 03/16/23 04/15/23  Zigmund Daniel., MD  budesonide-formoterol [facility]) 160-4.5 MCG/ACT inhaler Inhale 2 puffs into the lungs 2 (two) times daily. Patient not taking: Reported on 03/29/2023 02/25/23   Sabas Sous, MD  carbamazepine (TEGRETOL) 200 MG tablet 800mg  PO QD X 1D, then 600mg  PO QD X 1D, then 400mg  QD X 1D, then 200mg  PO QD X 2D Patient not taking: Reported on 03/29/2023 03/18/23   Palumbo, April, MD  cyanocobalamin (VITAMIN B12) 1000 MCG tablet Take 1 tablet (1,000 mcg total) by mouth daily. Patient not taking: Reported on 03/29/2023 03/16/23 07/24/23  Zigmund Daniel., MD  diclofenac Sodium (VOLTAREN) 1 % GEL Apply 2 g topically 4 (four) times daily. Patient not taking: Reported on 03/07/2023 02/19/23   [provider]  famotidine (PEPCID) 20 MG tablet Take 1 tablet (20 mg total) by mouth daily. Patient not taking: Reported on 03/07/2023 02/25/23   Sabas Sous, MD  ferrous sulfate 325 (65 FE) MG tablet Take 1 tablet (325 mg total) by mouth every other  day. Patient not taking: Reported on 03/29/2023 03/18/23 10/02/23  Zigmund Daniel., MD  folic acid (FOLVITE) 1 MG tablet Take 1 tablet (1 mg total) by mouth daily. Patient not taking: Reported on 03/30/2023 03/17/23 05/16/23  Zigmund Daniel., MD  furosemide (LASIX) 20 MG tablet Take 1 tablet (20 mg total) by mouth daily. Patient not taking: Reported on 03/07/2023 02/24/23   Achille Rich, PA-C  gabapentin (NEURONTIN) 100 MG capsule Take 100 mg by mouth 3 (three) times daily. Patient not taking: Reported on 03/07/2023 02/19/23   [provider]  hyoscyamine (LEVSIN/SL) 0.125 MG SL tablet  Place 1 tablet (0.125 mg total) under the tongue every 4 (four) hours as needed (pain). Up to 1.25 mg daily Patient not taking: Reported on 02/22/2023 01/23/23   Arthor Captain, PA-C  metFORMIN (GLUCOPHAGE-XR) 750 MG 24 hr tablet Take 750 mg by mouth daily. Patient not taking: Reported on 03/29/2023 03/23/23   [provider]  nicotine (NICODERM CQ - DOSED IN MG/24 HOURS) 21 mg/24hr patch Place 1 patch (21 mg total) onto the skin daily. Patient not taking: Reported on 03/29/2023 03/16/23   Zigmund Daniel., MD  nicotine polacrilex (NICORETTE) 2 MG gum Take 1 each (2 mg total) by mouth as needed for smoking cessation. Patient not taking: Reported on 03/29/2023 03/16/23   Zigmund Daniel., MD  ondansetron (ZOFRAN) 4 MG tablet Take 1 tablet (4 mg total) by mouth every 6 (six) hours. Patient not taking: Reported on 02/22/2023 01/20/23   Prosperi, Christian H, PA-C  ondansetron (ZOFRAN-ODT) 4 MG disintegrating tablet Take 1 tablet (4 mg total) by mouth every 8 (eight) hours as needed for nausea or vomiting. Patient not taking: Reported on 03/07/2023 03/01/23   Laurence Spates, MD  OXYGEN Inhale 4-5 L into the lungs as needed (COPD).    [provider]  pantoprazole (PROTONIX) 40 MG tablet Take 1 tablet (40 mg total) by mouth daily. Patient not taking: Reported on 03/29/2023 03/17/23 04/16/23  Zigmund Daniel., MD  sucralfate (CARAFATE) 1 g tablet Take 1 tablet (1 g total) by mouth 4 (four) times daily -  with meals and at bedtime. Patient not taking: Reported on 02/22/2023 02/11/23   Horton, Mayer Masker, MD  tiZANidine (ZANAFLEX) 2 MG tablet Take 2 mg by mouth at bedtime. Patient not taking: Reported on 03/07/2023 02/19/23   [provider]  zonisamide (ZONEGRAN) 100 MG capsule Take 2 capsules (200 mg total) by mouth daily. Patient not taking: Reported on 02/22/2023 01/12/23   Lewie Chamber, MD      Allergies    Iron dextran, Aspirin, and Penicillins    Review of Systems    Review of Systems  Respiratory:  Positive for shortness of breath.   Review of systems completed and notable as per HPI.  ROS otherwise negative.   Physical Exam Updated Vital Signs BP (!) 142/84   Pulse (!) 116   Temp 98.2 F (36.8 C) (Oral)   Resp (!) 25   LMP 01/08/2011   SpO2 100%  Physical Exam Vitals and nursing note reviewed.  Constitutional:      General: She is not in acute distress.    Appearance: She is well-developed.  HENT:     Head: Normocephalic and atraumatic.  Eyes:     Extraocular Movements: Extraocular movements intact.     Conjunctiva/sclera: Conjunctivae normal.     Pupils: Pupils are equal, round, and reactive to light.  Cardiovascular:  Rate and Rhythm: Regular rhythm. Tachycardia present.     Heart sounds: No murmur heard. Pulmonary:     Effort: Pulmonary effort is normal. No tachypnea or respiratory distress.     Breath sounds: Normal breath sounds. No wheezing.  Abdominal:     Palpations: Abdomen is soft.     Tenderness: There is no abdominal tenderness.  Musculoskeletal:        General: No swelling.     Cervical back: Neck supple.     Right lower leg: No tenderness. No edema.     Left lower leg: No tenderness. No edema.  Skin:    General: Skin is warm and dry.     Capillary Refill: Capillary refill takes less than 2 seconds.  Neurological:     Mental Status: She is alert.  Psychiatric:        Mood and Affect: Mood normal.     ED Results / Procedures / Treatments   Labs (all labs ordered are listed, but only abnormal results are displayed) Labs Reviewed  BASIC METABOLIC PANEL - Abnormal; Notable for the following components:      Result Value   Sodium 128 (*)    Chloride 94 (*)    Glucose, Bld 274 (*)    Calcium 8.6 (*)    All other components within normal limits  CBC WITH DIFFERENTIAL/PLATELET - Abnormal; Notable for the following components:   RBC 3.77 (*)    Hemoglobin 8.9 (*)    HCT 29.7 (*)    MCV 78.8 (*)    MCH  23.6 (*)    RDW 20.3 (*)    All other components within normal limits  CBG MONITORING, ED - Abnormal; Notable for the following components:   Glucose-Capillary 285 (*)    All other components within normal limits  TROPONIN I (HIGH SENSITIVITY)    EKG EKG Interpretation Date/Time:  Thursday April 02 2023 05:43:57 EDT Ventricular Rate:  129 PR Interval:  132 QRS Duration:  132 QT Interval:  292 QTC Calculation: 428 R Axis:   48  Text Interpretation: Sinus tachycardia LAE, consider biatrial enlargement Nonspecific intraventricular conduction delay Confirmed by Fulton Reek [phone]) on 04/02/2023 7:57:24 AM  Radiology DG Chest 2 View  Result Date: 04/02/2023 CLINICAL DATA:  57 year old female with shortness of breath. EXAM: CHEST - 2 VIEW COMPARISON:  Portable chest 04/01/2023 and earlier. FINDINGS: PA and lateral views at 0542 hours. Mediastinal contours are normal. Visualized tracheal air column is within normal limits. Borderline to mildly hyperinflated lungs. Symmetric increased pulmonary interstitial opacity is mild, probably chronic. No pneumothorax, pulmonary edema, pleural effusion or confluent lung opacity. Mild kyphoscoliosis. Midthoracic compression fractures. Negative visible bowel gas. IMPRESSION: 1. Stable borderline to mild hyperinflation and nonspecific interstitial lung changes. No acute cardiopulmonary abnormality. 2. Nonacute thoracic compression fractures. Electronically Signed   By: Odessa Fleming M.D.   On: 04/02/2023 05:57   DG Chest Port 1 View  Result Date: 04/01/2023 CLINICAL DATA:  Chest pain and shortness of breath. History of asthma and COPD. EXAM: PORTABLE CHEST 1 VIEW COMPARISON:  03/31/2023. FINDINGS: The heart size and mediastinal contours are within normal limits. No consolidation, effusion, or pneumothorax. No acute osseous abnormality. IMPRESSION: No active disease. Electronically Signed   By: Thornell Sartorius M.D.   On: 04/01/2023 21:06     Procedures Procedures    Medications Ordered in ED Medications  albuterol (VENTOLIN HFA) 108 (90 Base) MCG/ACT inhaler 2 puff (has no administration in time range)  ED Course/ Medical Decision Making/ A&P                                 Medical Decision Making Amount and/or Complexity of Data Reviewed Labs: ordered. Radiology: ordered.  Risk Prescription drug management.   Medical Decision Making:   NATALIE MCEUEN is a 57 y.o. female who presented to the ED today with shortness of breath.  Also notable for tachycardia.  On exam she is well-appearing, normal respiratory effort and normal oxygenation on room air.  She is ambulating without difficulty.  Her wheezing has resolved, she reported wheezing earlier which resolved with her breathing treatments as suspect this is related to COPD and running out of her medications recently.  She was reported hyperglycemic with EMS, will obtain lab work for evaluation of this.  Also have her rehydrate orally.  She is mildly tachycardic, no ischemic changes on EKG.  Lower concern for ACS given no chest pain.  Suspect her shortness of breath related to running out of her inhaler especially given resolution with breathing treatments.  Lower special for PE, no hypoxia, DVT symptoms and seems more consistent with COPD exacerbation.  Suspect tachycardia likely related to breathing treatments and no p.o. intake for 24 hours.  Consider withdrawal as well.   Patient placed on continuous vitals and telemetry monitoring while in ED which was reviewed periodically.  Reviewed and confirmed nursing documentation for past medical history, family history, social history.  Reassessment and Plan:   On reassessment tachycardia has improved slightly.  Her lab work is notable for stable anemia, no leukocytosis.  EKG without ischemic changes, troponin normal low concern for ACS.  BMP notable for mild hyperglycemia without signs of DKA as well as mild hyponatremia  corrected to 131.  Chest x-ray without pneumonia.  She is ambulating without difficulty, no hypoxia or shortness of breath.  She feels like her breathing has come normal after breathing treat with EMS.  I think this is related to her COPD and running out of her medications recently.  She is getting a nebulizer refilled today.  She has a PCP appointment tomorrow.  I discussed with her daughter over the phone who is comfortable plan for discharge as patient would really like to go home.  I suspect her tachycardia is related to albuterol.  Low concern for PE or other acute abnormality at this time.  Strict return precautions given.   Patient's presentation is most consistent with acute complicated illness / injury requiring diagnostic workup.           Final Clinical Impression(s) / ED Diagnoses Final diagnoses:  SOB (shortness of breath)    Rx / DC Orders ED Discharge Orders     None         Laurence Spates, MD 04/02/23 [phone]

## 2023-04-03 DIAGNOSIS — I10 Essential (primary) hypertension: Secondary | ICD-10-CM | POA: Diagnosis not present

## 2023-04-03 DIAGNOSIS — Z09 Encounter for follow-up examination after completed treatment for conditions other than malignant neoplasm: Secondary | ICD-10-CM | POA: Diagnosis not present

## 2023-04-03 DIAGNOSIS — Z0289 Encounter for other administrative examinations: Secondary | ICD-10-CM | POA: Diagnosis not present

## 2023-04-04 ENCOUNTER — Other Ambulatory Visit: Payer: Self-pay

## 2023-04-04 ENCOUNTER — Encounter (HOSPITAL_COMMUNITY): Payer: Self-pay

## 2023-04-04 ENCOUNTER — Emergency Department (HOSPITAL_COMMUNITY)
Admission: EM | Admit: 2023-04-04 | Discharge: 2023-04-04 | Disposition: A | Discharge status: Home / Self Care | Payer: 59 | Attending: Emergency Medicine | Admitting: Emergency Medicine

## 2023-04-04 DIAGNOSIS — J449 Chronic obstructive pulmonary disease, unspecified: Secondary | ICD-10-CM | POA: Diagnosis not present

## 2023-04-04 DIAGNOSIS — F17210 Nicotine dependence, cigarettes, uncomplicated: Secondary | ICD-10-CM | POA: Insufficient documentation

## 2023-04-04 DIAGNOSIS — D649 Anemia, unspecified: Secondary | ICD-10-CM | POA: Diagnosis not present

## 2023-04-04 DIAGNOSIS — Z743 Need for continuous supervision: Secondary | ICD-10-CM | POA: Diagnosis not present

## 2023-04-04 DIAGNOSIS — I499 Cardiac arrhythmia, unspecified: Secondary | ICD-10-CM | POA: Diagnosis not present

## 2023-04-04 DIAGNOSIS — J441 Chronic obstructive pulmonary disease with (acute) exacerbation: Secondary | ICD-10-CM | POA: Diagnosis not present

## 2023-04-04 DIAGNOSIS — R0600 Dyspnea, unspecified: Secondary | ICD-10-CM | POA: Diagnosis present

## 2023-04-04 DIAGNOSIS — R062 Wheezing: Secondary | ICD-10-CM | POA: Diagnosis not present

## 2023-04-04 LAB — BASIC METABOLIC PANEL
Anion gap: 14 (ref 5–15)
BUN: <5 mg/dL — ABNORMAL LOW (ref 6–20)
CO2: 23 mmol/L (ref 22–32)
Calcium: 8.5 mg/dL — ABNORMAL LOW (ref 8.9–10.3)
Chloride: 99 mmol/L (ref 98–111)
Creatinine, Ser: 0.51 mg/dL (ref 0.44–1.00)
GFR, Estimated: >60 mL/min (ref >60)
Glucose, Bld: 187 mg/dL — ABNORMAL HIGH (ref 70–99)
Potassium: 3.3 mmol/L — ABNORMAL LOW (ref 3.5–5.1)
Sodium: 136 mmol/L (ref 135–145)

## 2023-04-04 LAB — D-DIMER, QUANTITATIVE: D-Dimer, Quant: 0.53 ug{FEU}/mL — ABNORMAL HIGH (ref 0.00–0.50)

## 2023-04-04 LAB — CBC
HCT: 27.5 % — ABNORMAL LOW (ref 36.0–46.0)
Hemoglobin: 8 g/dL — ABNORMAL LOW (ref 12.0–15.0)
MCH: 22.3 pg — ABNORMAL LOW (ref 26.0–34.0)
MCHC: 29.1 g/dL — ABNORMAL LOW (ref 30.0–36.0)
MCV: 76.8 fL — ABNORMAL LOW (ref 80.0–100.0)
Platelets: 225 10*3/uL (ref 150–400)
RBC: 3.58 MIL/uL — ABNORMAL LOW (ref 3.87–5.11)
RDW: 20.3 % — ABNORMAL HIGH (ref 11.5–15.5)
WBC: 7.2 10*3/uL (ref 4.0–10.5)
nRBC: 0 % (ref 0.0–0.2)

## 2023-04-04 NOTE — ED Notes (Signed)
Patient dressed and walking down the hall, stating she was ready to go and spoke with doctor and she needed a bus pass.

## 2023-04-04 NOTE — ED Notes (Signed)
Patient called out, nurse at bedside.   Patient is stating she wants to get dressed so she can go.   Patient states "I've been here too long and the doctors already seen me".   Nurse tried to explain that, doctor had ordered blood work and we were waiting for results.   Patient states "they only brought me here so I can get an inhaler and I haven't gotten one yet".

## 2023-04-04 NOTE — Discharge Instructions (Addendum)
Please follow-up with your doctor. Return to the emergency department if you are having new or worsening symptoms Please use your oxygen at home if you are short of breath.

## 2023-04-04 NOTE — ED Notes (Signed)
Patient provided with sandwich, crackers and cola.

## 2023-04-04 NOTE — ED Triage Notes (Addendum)
Patient arrives by EMS from home with c/o shortness of breath.   Patient seen at [facility] yesterday for same complaint.  Patient reports has oxygen at home that she uses as needed and reports using more frequently.   Hx of COPD.    Patient received a duoneb prior to arrival.   Patient denies pain just reports she is hungry.

## 2023-04-04 NOTE — ED Provider Notes (Addendum)
[place] EMERGENCY DEPARTMENT AT [facility] Provider Note   CSN: [id number] Arrival date & time: 04/04/23  4098     History  Chief Complaint  Patient presents with   Shortness of Breath    [name] is a 57 y.o. female.  HPI 57 year old female history of COPD, smoker, presents today complaining of dyspnea.  She states it began yesterday and she was seen yesterday at [facility]  She continues to feel short of breath.  She reports that she has oxygen to use at home where she feels short of breath.  She is not currently using oxygen.  She states that she had her albuterol refilled.  She was discharged to home.  During the night she walked over to her mother's house but her mother was not awake.  She reports smoking a pack per day.  Denies chest pain, denies history of CHF, denies history of PE or DVT, denies cough, fever, chills, runny nose, sore throat  Home Medications Prior to Admission medications   Medication Sig Start Date End Date Taking? Authorizing Provider  acetaminophen (TYLENOL) 325 MG tablet Take 650 mg by mouth every 6 (six) hours as needed for mild pain. Patient not taking: Reported on 03/29/2023    [provider]  albuterol (VENTOLIN HFA) 108 (90 Base) MCG/ACT inhaler Inhale 1-2 puffs into the lungs every 6 (six) hours as needed for wheezing or shortness of breath. 04/01/23   Lonell Grandchild, MD  ARIPiprazole (ABILIFY) 10 MG tablet Take 1 tablet (10 mg total) by mouth daily. Follow up with psychiatry outpatient for medication refills and adjustments Patient not taking: Reported on 03/29/2023 03/16/23 04/15/23  Zigmund Daniel., MD  budesonide-formoterol [facility]) 160-4.5 MCG/ACT inhaler Inhale 2 puffs into the lungs 2 (two) times daily. Patient not taking: Reported on 03/29/2023 02/25/23   Sabas Sous, MD  carbamazepine (TEGRETOL) 200 MG tablet 800mg  PO QD X 1D, then 600mg  PO QD X 1D, then 400mg  QD X 1D, then 200mg  PO QD X  2D Patient not taking: Reported on 03/29/2023 03/18/23   Palumbo, April, MD  cyanocobalamin (VITAMIN B12) 1000 MCG tablet Take 1 tablet (1,000 mcg total) by mouth daily. Patient not taking: Reported on 03/29/2023 03/16/23 07/24/23  Zigmund Daniel., MD  diclofenac Sodium (VOLTAREN) 1 % GEL Apply 2 g topically 4 (four) times daily. Patient not taking: Reported on 03/07/2023 02/19/23   [provider]  famotidine (PEPCID) 20 MG tablet Take 1 tablet (20 mg total) by mouth daily. Patient not taking: Reported on 03/07/2023 02/25/23   Sabas Sous, MD  ferrous sulfate 325 (65 FE) MG tablet Take 1 tablet (325 mg total) by mouth every other day. Patient not taking: Reported on 03/29/2023 03/18/23 10/02/23  Zigmund Daniel., MD  folic acid (FOLVITE) 1 MG tablet Take 1 tablet (1 mg total) by mouth daily. Patient not taking: Reported on 03/30/2023 03/17/23 05/16/23  Zigmund Daniel., MD  furosemide (LASIX) 20 MG tablet Take 1 tablet (20 mg total) by mouth daily. Patient not taking: Reported on 03/07/2023 02/24/23   Achille Rich, PA-C  gabapentin (NEURONTIN) 100 MG capsule Take 100 mg by mouth 3 (three) times daily. Patient not taking: Reported on 03/07/2023 02/19/23   [provider]  hyoscyamine (LEVSIN/SL) 0.125 MG SL tablet Place 1 tablet (0.125 mg total) under the tongue every 4 (four) hours as needed (pain). Up to 1.25 mg daily Patient not taking: Reported on 02/22/2023 01/23/23  Arthor Captain, PA-C  metFORMIN (GLUCOPHAGE-XR) 750 MG 24 hr tablet Take 750 mg by mouth daily. Patient not taking: Reported on 03/29/2023 03/23/23   [provider]  nicotine (NICODERM CQ - DOSED IN MG/24 HOURS) 21 mg/24hr patch Place 1 patch (21 mg total) onto the skin daily. Patient not taking: Reported on 03/29/2023 03/16/23   Zigmund Daniel., MD  nicotine polacrilex (NICORETTE) 2 MG gum Take 1 each (2 mg total) by mouth as needed for smoking cessation. Patient not taking: Reported on  03/29/2023 03/16/23   Zigmund Daniel., MD  ondansetron (ZOFRAN) 4 MG tablet Take 1 tablet (4 mg total) by mouth every 6 (six) hours. Patient not taking: Reported on 02/22/2023 01/20/23   Prosperi, Christian H, PA-C  ondansetron (ZOFRAN-ODT) 4 MG disintegrating tablet Take 1 tablet (4 mg total) by mouth every 8 (eight) hours as needed for nausea or vomiting. Patient not taking: Reported on 03/07/2023 03/01/23   Laurence Spates, MD  OXYGEN Inhale 4-5 L into the lungs as needed (COPD).    [provider]  pantoprazole (PROTONIX) 40 MG tablet Take 1 tablet (40 mg total) by mouth daily. Patient not taking: Reported on 03/29/2023 03/17/23 04/16/23  Zigmund Daniel., MD  sucralfate (CARAFATE) 1 g tablet Take 1 tablet (1 g total) by mouth 4 (four) times daily -  with meals and at bedtime. Patient not taking: Reported on 02/22/2023 02/11/23   Horton, Mayer Masker, MD  tiZANidine (ZANAFLEX) 2 MG tablet Take 2 mg by mouth at bedtime. Patient not taking: Reported on 03/07/2023 02/19/23   [provider]  zonisamide (ZONEGRAN) 100 MG capsule Take 2 capsules (200 mg total) by mouth daily. Patient not taking: Reported on 02/22/2023 01/12/23   Lewie Chamber, MD      Allergies    Iron dextran, Aspirin, and Penicillins    Review of Systems   Review of Systems  Physical Exam Updated Vital Signs BP (!) 155/68   Pulse (!) 121   Temp 98.3 F (36.8 C) (Oral)   Resp 18   Ht 1.575 m (5\' 2" )   Wt 54 kg   LMP 01/08/2011   SpO2 96%   BMI 21.77 kg/m  Physical Exam Vitals reviewed.  HENT:     Head: Normocephalic.     Right Ear: External ear normal.     Left Ear: External ear normal.     Nose: Nose normal.     Mouth/Throat:     Pharynx: Oropharynx is clear.  Eyes:     Pupils: Pupils are equal, round, and reactive to light.  Cardiovascular:     Rate and Rhythm: Regular rhythm. Tachycardia present.  Pulmonary:     Effort: Pulmonary effort is normal.     Breath sounds: Rhonchi present.      Comments: Few scattered rhonchi no expiratory wheezes noted Abdominal:     Palpations: Abdomen is soft.  Musculoskeletal:        General: Normal range of motion.     Cervical back: Normal range of motion.  Skin:    Capillary Refill: Capillary refill takes less than 2 seconds.  Neurological:     Mental Status: She is alert. Mental status is at baseline.     ED Results / Procedures / Treatments   Labs (all labs ordered are listed, but only abnormal results are displayed) Labs Reviewed  CBC - Abnormal; Notable for the following components:      Result Value   RBC  3.58 (*)    Hemoglobin 8.0 (*)    HCT 27.5 (*)    MCV 76.8 (*)    MCH 22.3 (*)    MCHC 29.1 (*)    RDW 20.3 (*)    All other components within normal limits  D-DIMER, QUANTITATIVE - Abnormal; Notable for the following components:   D-Dimer, Quant 0.53 (*)    All other components within normal limits  BASIC METABOLIC PANEL    EKG EKG Interpretation Date/Time:  Saturday April 04 2023 07:48:14 EDT Ventricular Rate:  125 PR Interval:  123 QRS Duration:  89 QT Interval:  303 QTC Calculation: 421 R Axis:   20  Text Interpretation: Sinus tachycardia No significant change since last tracing Confirmed by Margarita Grizzle [phone]) on 04/04/2023 8:10:11 AM  Radiology No results found.  Procedures Procedures    Medications Ordered in ED Medications - No data to display  ED Course/ Medical Decision Making/ A&P Clinical Course as of 04/04/23 0954  Sat Apr 04, 2023  0942 CBC reviewed interpreted and noted to have stable anemia [DR]  0951 D-dimer slightly elevated at 0.53 [DR]    Clinical Course User Index [DR] Margarita Grizzle, MD                                 Medical Decision Making Amount and/or Complexity of Data Reviewed Labs: ordered.   9:54 AM Patient is up and ambulatory and requesting to be discharged. She is not on oxygen and oxygen saturations of increased 96%.  However, she does remain  tachycardic. Review of her record shows that she has been tachycardic over several visits.  Usually between 110 and 41. 57 year old female history of COPD presents complaining of dyspnea.  On exam she is not wheezing.  On review of chart she has been seen many times with 60 visits in the past 6 months. Patient does not wish to stay.  Discussed elevated D-dimer.  Discussed risks of thromboembolic event.  She does not wish further workup.  Based on patient's multiple visits and ongoing tachycardia with a mildly elevated D-dimer, do doubt that this represents an acute thromboembolic event.  However, patient advised regarding need for follow-up and return precautions. Awaiting return of bmet, patient left/d/c        Final Clinical Impression(s) / ED Diagnoses Final diagnoses:  Chronic obstructive pulmonary disease, unspecified COPD type [facility])    Rx / DC Orders ED Discharge Orders     None         Margarita Grizzle, MD 04/04/23 6045    Margarita Grizzle, MD 04/04/23 1016

## 2023-04-04 NOTE — ED Notes (Signed)
Help gey patient undressed did EKG shown to Dr Rosalia Hammers got patient is some warm blankets patient is resting with call bell in reach

## 2023-04-05 ENCOUNTER — Encounter (HOSPITAL_COMMUNITY): Payer: Self-pay

## 2023-04-05 ENCOUNTER — Other Ambulatory Visit: Payer: Self-pay

## 2023-04-05 ENCOUNTER — Emergency Department (HOSPITAL_COMMUNITY)
Admission: EM | Admit: 2023-04-05 | Discharge: 2023-04-05 | Disposition: A | Discharge status: Home / Self Care | Payer: 59 | Attending: Emergency Medicine | Admitting: Emergency Medicine

## 2023-04-05 DIAGNOSIS — R0602 Shortness of breath: Secondary | ICD-10-CM | POA: Insufficient documentation

## 2023-04-05 DIAGNOSIS — Z743 Need for continuous supervision: Secondary | ICD-10-CM | POA: Diagnosis not present

## 2023-04-05 MED ORDER — IPRATROPIUM-ALBUTEROL 0.5-2.5 (3) MG/3ML IN SOLN
3.0000 mL | Freq: Once | RESPIRATORY_TRACT | Status: AC
  Administered 2023-04-05: 3 mL via RESPIRATORY_TRACT
  Filled 2023-04-05: qty 3

## 2023-04-05 NOTE — ED Triage Notes (Signed)
Pt complaining of shortness of breath that started when she woke up this morning to go to the bathroom. Also complaining of legs hurting. Pt did get a duoneb.by ems.

## 2023-04-05 NOTE — ED Provider Notes (Signed)
[place] EMERGENCY DEPARTMENT AT [facility] Provider Note   CSN: [id number] Arrival date & time: 04/05/23  0221     History  Chief Complaint  Patient presents with   Shortness of Breath   [name] is a 57 y.o. female who is well-known to this department with greater than 60 visits in the last 6 months.  Frequently visits for COPD exacerbations and shortness of breath.  Was seen yesterday with reassuring evaluation.  Does have history of elevated D-dimer but has refused PE study in the past including yesterday.  At this time she presents with chest tightness and shortness of breath which did improve with DuoNeb with EMS.  History of same.  Did start on Advair yesterday per PCP but has had only 24 hours use.  Denies chest pain syncope nausea vomiting fevers or chills.  HPI     Home Medications Prior to Admission medications   Medication Sig Start Date End Date Taking? Authorizing Provider  acetaminophen (TYLENOL) 325 MG tablet Take 650 mg by mouth every 6 (six) hours as needed for mild pain. Patient not taking: Reported on 03/29/2023    [provider]  albuterol (VENTOLIN HFA) 108 (90 Base) MCG/ACT inhaler Inhale 1-2 puffs into the lungs every 6 (six) hours as needed for wheezing or shortness of breath. 04/01/23   Lonell Grandchild, MD  ARIPiprazole (ABILIFY) 10 MG tablet Take 1 tablet (10 mg total) by mouth daily. Follow up with psychiatry outpatient for medication refills and adjustments Patient not taking: Reported on 03/29/2023 03/16/23 04/15/23  Zigmund Daniel., MD  budesonide-formoterol [facility]) 160-4.5 MCG/ACT inhaler Inhale 2 puffs into the lungs 2 (two) times daily. Patient not taking: Reported on 03/29/2023 02/25/23   Sabas Sous, MD  carbamazepine (TEGRETOL) 200 MG tablet 800mg  PO QD X 1D, then 600mg  PO QD X 1D, then 400mg  QD X 1D, then 200mg  PO QD X 2D Patient not taking: Reported on 03/29/2023 03/18/23   Palumbo, April, MD   cyanocobalamin (VITAMIN B12) 1000 MCG tablet Take 1 tablet (1,000 mcg total) by mouth daily. Patient not taking: Reported on 03/29/2023 03/16/23 07/24/23  Zigmund Daniel., MD  diclofenac Sodium (VOLTAREN) 1 % GEL Apply 2 g topically 4 (four) times daily. Patient not taking: Reported on 03/07/2023 02/19/23   [provider]  famotidine (PEPCID) 20 MG tablet Take 1 tablet (20 mg total) by mouth daily. Patient not taking: Reported on 03/07/2023 02/25/23   Sabas Sous, MD  ferrous sulfate 325 (65 FE) MG tablet Take 1 tablet (325 mg total) by mouth every other day. Patient not taking: Reported on 03/29/2023 03/18/23 10/02/23  Zigmund Daniel., MD  folic acid (FOLVITE) 1 MG tablet Take 1 tablet (1 mg total) by mouth daily. Patient not taking: Reported on 03/30/2023 03/17/23 05/16/23  Zigmund Daniel., MD  furosemide (LASIX) 20 MG tablet Take 1 tablet (20 mg total) by mouth daily. Patient not taking: Reported on 03/07/2023 02/24/23   Achille Rich, PA-C  gabapentin (NEURONTIN) 100 MG capsule Take 100 mg by mouth 3 (three) times daily. Patient not taking: Reported on 03/07/2023 02/19/23   [provider]  hyoscyamine (LEVSIN/SL) 0.125 MG SL tablet Place 1 tablet (0.125 mg total) under the tongue every 4 (four) hours as needed (pain). Up to 1.25 mg daily Patient not taking: Reported on 02/22/2023 01/23/23   Arthor Captain, PA-C  metFORMIN (GLUCOPHAGE-XR) 750 MG 24 hr tablet Take 750 mg by mouth  daily. Patient not taking: Reported on 03/29/2023 03/23/23   [provider]  nicotine (NICODERM CQ - DOSED IN MG/24 HOURS) 21 mg/24hr patch Place 1 patch (21 mg total) onto the skin daily. Patient not taking: Reported on 03/29/2023 03/16/23   Zigmund Daniel., MD  nicotine polacrilex (NICORETTE) 2 MG gum Take 1 each (2 mg total) by mouth as needed for smoking cessation. Patient not taking: Reported on 03/29/2023 03/16/23   Zigmund Daniel., MD  ondansetron (ZOFRAN) 4 MG  tablet Take 1 tablet (4 mg total) by mouth every 6 (six) hours. Patient not taking: Reported on 02/22/2023 01/20/23   Prosperi, Christian H, PA-C  ondansetron (ZOFRAN-ODT) 4 MG disintegrating tablet Take 1 tablet (4 mg total) by mouth every 8 (eight) hours as needed for nausea or vomiting. Patient not taking: Reported on 03/07/2023 03/01/23   Laurence Spates, MD  OXYGEN Inhale 4-5 L into the lungs as needed (COPD).    [provider]  pantoprazole (PROTONIX) 40 MG tablet Take 1 tablet (40 mg total) by mouth daily. Patient not taking: Reported on 03/29/2023 03/17/23 04/16/23  Zigmund Daniel., MD  sucralfate (CARAFATE) 1 g tablet Take 1 tablet (1 g total) by mouth 4 (four) times daily -  with meals and at bedtime. Patient not taking: Reported on 02/22/2023 02/11/23   Horton, Mayer Masker, MD  tiZANidine (ZANAFLEX) 2 MG tablet Take 2 mg by mouth at bedtime. Patient not taking: Reported on 03/07/2023 02/19/23   [provider]  zonisamide (ZONEGRAN) 100 MG capsule Take 2 capsules (200 mg total) by mouth daily. Patient not taking: Reported on 02/22/2023 01/12/23   Lewie Chamber, MD      Allergies    Iron dextran, Aspirin, and Penicillins    Review of Systems   Review of Systems  Respiratory:  Positive for chest tightness and shortness of breath.     Physical Exam Updated Vital Signs BP 127/72 (BP Location: Right Arm)   Pulse (!) 115   Temp 98.4 F (36.9 C) (Oral)   Resp 20   Ht 5\' 2"  (1.575 m)   Wt 54 kg   LMP 01/08/2011   SpO2 100%   BMI 21.77 kg/m  Physical Exam Vitals and nursing note reviewed.  Constitutional:      Appearance: She is not ill-appearing or toxic-appearing.  HENT:     Head: Normocephalic and atraumatic.     Mouth/Throat:     Mouth: Mucous membranes are moist.     Pharynx: No oropharyngeal exudate or posterior oropharyngeal erythema.  Eyes:     General:        Right eye: No discharge.        Left eye: No discharge.     Conjunctiva/sclera:  Conjunctivae normal.  Cardiovascular:     Rate and Rhythm: Normal rate and regular rhythm.     Pulses: Normal pulses.  Pulmonary:     Effort: Pulmonary effort is normal. No respiratory distress.     Breath sounds: Normal breath sounds. Decreased air movement present. No wheezing or rales.  Chest:     Chest wall: No mass, tenderness or edema.  Abdominal:     General: Bowel sounds are normal. There is no distension.     Tenderness: There is no abdominal tenderness.  Musculoskeletal:        General: No deformity.     Cervical back: Neck supple.  Skin:    General: Skin is warm and dry.  Neurological:  Mental Status: She is alert. Mental status is at baseline.  Psychiatric:        Mood and Affect: Mood normal.     ED Results / Procedures / Treatments   Labs (all labs ordered are listed, but only abnormal results are displayed) Labs Reviewed - No data to display  EKG None  Radiology No results found.  Procedures Procedures    Medications Ordered in ED Medications  ipratropium-albuterol (DUONEB) 0.5-2.5 (3) MG/3ML nebulizer solution 3 mL (3 mLs Nebulization Given 04/05/23 0615)    ED Course/ Medical Decision Making/ A&P                                 Medical Decision Making 57 y/o female with COPD and SOB, chest tightness.   Tachycardic though this is at patient's baseline.  Decreased air movement frequently because bilaterally without wheezing.  Risk Prescription drug management.   Patient with work up yesterday, 24 hours ago.  Here for similar presentation frequently, no acute change today.  Patient symptoms completely resolved after second DuoNeb here in the emergency department.  Clinical picture most consistent with COPD exacerbation.  Clinical concern for emergent underlying etiology of this patient symptoms that would warrant further ED workup or inpatient management is exceedingly low.  Livana  voiced understanding of her medical evaluation and treatment  plan. Each of their questions answered to their expressed satisfaction.  Return precautions were given.  Patient is well-appearing, stable, and was discharged in good condition.  This chart was dictated using voice recognition software, Dragon. Despite the best efforts of this provider to proofread and correct errors, errors may still occur which can change documentation meaning.         Final Clinical Impression(s) / ED Diagnoses Final diagnoses:  SOB (shortness of breath)    Rx / DC Orders ED Discharge Orders     None         Sherrilee Gilles 04/05/23 0705    Tilden Fossa, MD 04/05/23 [phone]

## 2023-04-07 DIAGNOSIS — J449 Chronic obstructive pulmonary disease, unspecified: Secondary | ICD-10-CM | POA: Diagnosis not present

## 2023-04-08 ENCOUNTER — Other Ambulatory Visit: Payer: Self-pay

## 2023-04-08 ENCOUNTER — Telehealth: Payer: Self-pay | Admitting: Licensed Clinical Social Worker

## 2023-04-08 ENCOUNTER — Encounter (HOSPITAL_COMMUNITY): Payer: Self-pay | Admitting: *Deleted

## 2023-04-08 ENCOUNTER — Emergency Department (HOSPITAL_COMMUNITY)
Admission: EM | Admit: 2023-04-08 | Discharge: 2023-04-08 | Discharge status: Against Medical Advice | Payer: 59 | Attending: Emergency Medicine | Admitting: Emergency Medicine

## 2023-04-08 DIAGNOSIS — R739 Hyperglycemia, unspecified: Secondary | ICD-10-CM | POA: Diagnosis not present

## 2023-04-08 DIAGNOSIS — Z5321 Procedure and treatment not carried out due to patient leaving prior to being seen by health care provider: Secondary | ICD-10-CM | POA: Insufficient documentation

## 2023-04-08 DIAGNOSIS — R110 Nausea: Secondary | ICD-10-CM | POA: Diagnosis not present

## 2023-04-08 DIAGNOSIS — Z743 Need for continuous supervision: Secondary | ICD-10-CM | POA: Diagnosis not present

## 2023-04-08 DIAGNOSIS — R109 Unspecified abdominal pain: Secondary | ICD-10-CM | POA: Diagnosis not present

## 2023-04-08 DIAGNOSIS — R079 Chest pain, unspecified: Secondary | ICD-10-CM | POA: Diagnosis not present

## 2023-04-08 DIAGNOSIS — I499 Cardiac arrhythmia, unspecified: Secondary | ICD-10-CM | POA: Diagnosis not present

## 2023-04-08 LAB — URINALYSIS, ROUTINE W REFLEX MICROSCOPIC
Bilirubin Urine: NEGATIVE
Glucose, UA: 150 mg/dL — AB
Hgb urine dipstick: NEGATIVE
Ketones, ur: NEGATIVE mg/dL
Leukocytes,Ua: NEGATIVE
Nitrite: NEGATIVE
Protein, ur: NEGATIVE mg/dL
Specific Gravity, Urine: 1.006 (ref 1.005–1.030)
pH: 5 (ref 5.0–8.0)

## 2023-04-08 LAB — RAPID URINE DRUG SCREEN, HOSP PERFORMED
Amphetamines: NOT DETECTED
Barbiturates: NOT DETECTED
Benzodiazepines: NOT DETECTED
Cocaine: NOT DETECTED
Opiates: NOT DETECTED
Tetrahydrocannabinol: NOT DETECTED

## 2023-04-08 LAB — COMPREHENSIVE METABOLIC PANEL
ALT: 27 U/L (ref 0–44)
AST: 58 U/L — ABNORMAL HIGH (ref 15–41)
Albumin: 4.1 g/dL (ref 3.5–5.0)
Alkaline Phosphatase: 86 U/L (ref 38–126)
Anion gap: 10 (ref 5–15)
BUN: 7 mg/dL (ref 6–20)
CO2: 24 mmol/L (ref 22–32)
Calcium: 8.7 mg/dL — ABNORMAL LOW (ref 8.9–10.3)
Chloride: 100 mmol/L (ref 98–111)
Creatinine, Ser: 0.47 mg/dL (ref 0.44–1.00)
GFR, Estimated: >60 mL/min (ref >60)
Glucose, Bld: 143 mg/dL — ABNORMAL HIGH (ref 70–99)
Potassium: 4 mmol/L (ref 3.5–5.1)
Sodium: 134 mmol/L — ABNORMAL LOW (ref 135–145)
Total Bilirubin: 0.7 mg/dL (ref 0.3–1.2)
Total Protein: 7.7 g/dL (ref 6.5–8.1)

## 2023-04-08 LAB — MAGNESIUM: Magnesium: 2.2 mg/dL (ref 1.7–2.4)

## 2023-04-08 LAB — CBC
HCT: 30.4 % — ABNORMAL LOW (ref 36.0–46.0)
Hemoglobin: 8.8 g/dL — ABNORMAL LOW (ref 12.0–15.0)
MCH: 22.9 pg — ABNORMAL LOW (ref 26.0–34.0)
MCHC: 28.9 g/dL — ABNORMAL LOW (ref 30.0–36.0)
MCV: 79.2 fL — ABNORMAL LOW (ref 80.0–100.0)
Platelets: 226 10*3/uL (ref 150–400)
RBC: 3.84 MIL/uL — ABNORMAL LOW (ref 3.87–5.11)
RDW: 20.1 % — ABNORMAL HIGH (ref 11.5–15.5)
WBC: 7 10*3/uL (ref 4.0–10.5)
nRBC: 0 % (ref 0.0–0.2)

## 2023-04-08 LAB — LIPASE, BLOOD: Lipase: 45 U/L (ref 11–51)

## 2023-04-08 NOTE — Patient Instructions (Signed)
Visit Information  Ms. Schuff was given information about Medicaid Managed Care team care coordination services and verbally consented to engagement with the [facility] Managed Care team.   Date/time of initial [facility] RNCM care management/care coordination outreach:  04/22/23 at 9 am  Dickie La, BSW, MSW, [employer] Managed Medicaid LCSW [facility]  [email or web address] Phone: [phone]

## 2023-04-08 NOTE — Patient Outreach (Addendum)
Medicaid Managed Care Social Work Note  04/08/2023 Name:  [name] MRN:  [id number] DOB:  Apr 20, 1966  [name] is an 57 y.o. year old female who is a primary patient of Fleet Contras, MD.  The Medicaid Managed Care Coordination team was consulted for assistance with:  Mental Health Counseling and Resources  Ms. Lab was given information about Medicaid Managed Care Coordination team services today. [name] Patient agreed to services and verbal consent obtained. Patient agreed to be followed by a RNCM only. She has had 69 ED visits this year and is a patient at [facility] where she was recently had a RED Emmi for Sad, Hopeless, Anxious, or Emptiness. [facility] LCSW completed call to ensure her safety/wellbeing and she reports feeling better today and denies any SI/H at this time. She DOES NOT wish to pursue behavioral health treatment at this time but was informed to please contact this [facility] [place] LSCW directly if she wishes to for education and support on behavioral health needs. Emotional support provided by [facility] LCSW. Patient agreed to physically write down [phone] number and [facility]'s walk in clinic information. Patient is agreeable to [facility] program for RNCM only. She reports that she would benefit from having a nurse case manager to contact her and assist with care coordination and health related needs. [facility] LCSW encouraged patient to contact PCP today to get scheduled.   Engaged with patient  for by telephone for initial visit in response to referral for case management and/or care coordination services.   Assessments/Interventions:  Review of past medical history, allergies, medications, health status, including review of consultants reports, laboratory and other test data, was performed as part of comprehensive evaluation and provision of chronic care management services.  SDOH: (Social Determinant of Health) assessments and interventions performed: SDOH Interventions     Flowsheet Row Telephone from 04/08/2023 in [place] [facility]ULATION [facility] DEPARTMENT ED to Hosp-Admission (Discharged) from 06/15/2022 in [facility] 4E CV SURGICAL PROGRESSIVE CARE ED to Hosp-Admission (Discharged) from 05/04/2022 in [facility] 28M KIDNEY UNIT  SDOH Interventions     Food Insecurity Interventions Intervention Not Indicated -- Intervention Not Indicated  Housing Interventions -- -- Intervention Not Indicated  Transportation Interventions -- Contracted Vendor Intervention Not Indicated  Utilities Interventions -- -- Intervention Not Indicated  Stress Interventions Offered [employer] Resources, Provide Counseling  [Patient declines needing MH support but was agreeable to writing down [phone] number and [facility] walk in clinic information] -- --  Social Connections Interventions -- -- Intervention Not Indicated       Advanced Directives Status:  Not addressed in this encounter.  Care Plan                 Allergies  Allergen Reactions   Iron Dextran Shortness Of Breath and Anxiety   Aspirin Nausea And Vomiting and Other (See Comments)    Ok to take tylenol or ibuprofen    Penicillins Other (See Comments)    Unknown. Childhood reaction     Medications Reviewed Today   Medications were not reviewed in this encounter     Patient Active Problem List   Diagnosis Date Noted   Acute on chronic anemia 03/13/2023   B12 deficiency 03/13/2023   Hyponatremia 02/22/2023   Chronic alcohol use 02/22/2023   Continuous dependence on cigarette smoking 02/22/2023   Alcohol use disorder, severe, dependence (HCC) 01/03/2023   Suicidal ideation 01/03/2023   Abnormal weight loss 01/02/2023   Nausea and  vomiting 01/02/2023   Hyperglycemia due to type 2 diabetes mellitus (HCC) 01/02/2023   History of colonic polyps 01/02/2023   Acute on chronic respiratory failure with hypoxia (HCC) 12/11/2022   Pulmonary nodules 12/11/2022   Type 2 diabetes mellitus without complications (HCC)  12/11/2022   Malnutrition of moderate degree 11/22/2022   Withdrawal symptoms, alcohol (HCC) 11/20/2022   CAP (community acquired pneumonia) 11/20/2022   Closed fracture of right zygomaticomaxillary complex (HCC) 08/26/2022   Blunt trauma 08/26/2022   Alcohol withdrawal (HCC) 08/25/2022   Seizure-like activity (HCC) 08/23/2022   Cocaine abuse (HCC) 08/23/2022   Closed fracture of left proximal humerus 07/08/2022   Homicidal ideation 07/07/2022   Aggressive behavior 07/05/2022   Hyperbilirubinemia 06/16/2022   Pressure injury of skin 06/16/2022   Pulmonary embolism (HCC) 05/19/2022   Postoperative wound infection 05/04/2022   Thrombocytosis 05/04/2022   Seizure (HCC) 04/17/2022   Memory loss 04/17/2022   SOB (shortness of breath) 04/05/2022   Abnormal EEG 02/12/2022   Constipation 02/12/2022   Vitamin A deficiency 02/12/2022   Vitamin D deficiency 02/12/2022   Vitamin C deficiency 02/12/2022   Iron deficiency anemia 02/12/2022   GERD (gastroesophageal reflux disease) 02/12/2022   Tobacco abuse 02/12/2022   Protein-calorie malnutrition, severe 01/14/2022   Acute respiratory failure with hypoxia and hypercapnia (HCC) 11/14/2021   Anemia 11/14/2021   Alcohol abuse with intoxication (HCC) 11/12/2021   Dysphagia 05/28/2021   Nasal septal perforation 05/28/2021   Referred otalgia of left ear 05/28/2021   Oral pain 05/28/2021   Encephalopathy 10/10/2020   Polysubstance (excluding opioids) dependence, daily use (HCC) 08/04/2018   Substance induced mood disorder (HCC) 08/03/2018   Schizoaffective disorder, bipolar type (HCC) 11/13/2017   Polysubstance abuse (HCC) 11/13/2017   Type 2 diabetes mellitus (HCC) 09/06/2015   Right knee pain 01/05/2015   Muscular deconditioning 09/08/2014   Primary insomnia 09/08/2014   Encounter for monitoring opioid maintenance therapy 07/10/2014   Myofascial pain syndrome 12/09/2013   DDD (degenerative disc disease), lumbosacral 08/12/2013   Low  back pain 08/12/2013   Lumbosacral spondylosis 04/13/2013   Primary localized osteoarthrosis, lower leg 12/14/2012   Chronic pain 11/27/2012   Hypothyroidism 11/27/2012   Sacroiliitis (HCC) 11/27/2012   Pain in joint involving lower leg 11/27/2012   Essential hypertension 11/04/2011   Paroxysmal atrial fibrillation with RVR (HCC) 09/30/2011   COPD (chronic obstructive pulmonary disease) (HCC) 09/29/2011   Plan: The Managed Medicaid care management team will reach out to the patient again over the next 30 days.  Date/time of initial [facility] RNCM care management/care coordination outreach:  04/22/23 at 9 am   Dickie La, BSW, MSW, [employer] Managed Medicaid LCSW [facility]  [email or web address] Phone: [phone]

## 2023-04-08 NOTE — ED Notes (Signed)
Pt moved OTF at 1824. No answer a this time.

## 2023-04-08 NOTE — ED Triage Notes (Signed)
BIB EMS due to abd pain with nausea 118/74-98% RA- 110- CBG 98% 4 L O2 Pt is on continuous 02

## 2023-04-08 NOTE — ED Triage Notes (Signed)
Pt requested something to drink, was told no due to nausea, she then states she is not nauseated and her stomach just hurts. It was explained she still cannot have anything to drink with abd pain. Now she is walking around stating "Oh my God" and wanting to leave.

## 2023-04-13 ENCOUNTER — Emergency Department (HOSPITAL_COMMUNITY)
Admission: EM | Admit: 2023-04-13 | Discharge: 2023-04-13 | Disposition: A | Discharge status: Home / Self Care | Payer: 59 | Attending: Emergency Medicine | Admitting: Emergency Medicine

## 2023-04-13 ENCOUNTER — Other Ambulatory Visit: Payer: Self-pay

## 2023-04-13 ENCOUNTER — Encounter (HOSPITAL_COMMUNITY): Payer: Self-pay

## 2023-04-13 DIAGNOSIS — R251 Tremor, unspecified: Secondary | ICD-10-CM | POA: Insufficient documentation

## 2023-04-13 DIAGNOSIS — R109 Unspecified abdominal pain: Secondary | ICD-10-CM | POA: Diagnosis not present

## 2023-04-13 DIAGNOSIS — J449 Chronic obstructive pulmonary disease, unspecified: Secondary | ICD-10-CM | POA: Diagnosis not present

## 2023-04-13 DIAGNOSIS — R000 Tachycardia, unspecified: Secondary | ICD-10-CM | POA: Diagnosis not present

## 2023-04-13 DIAGNOSIS — R569 Unspecified convulsions: Secondary | ICD-10-CM

## 2023-04-13 DIAGNOSIS — G40909 Epilepsy, unspecified, not intractable, without status epilepticus: Secondary | ICD-10-CM | POA: Insufficient documentation

## 2023-04-13 DIAGNOSIS — Z743 Need for continuous supervision: Secondary | ICD-10-CM | POA: Diagnosis not present

## 2023-04-13 DIAGNOSIS — I499 Cardiac arrhythmia, unspecified: Secondary | ICD-10-CM | POA: Diagnosis not present

## 2023-04-13 DIAGNOSIS — R6889 Other general symptoms and signs: Secondary | ICD-10-CM | POA: Diagnosis not present

## 2023-04-13 LAB — I-STAT CHEM 8, ED
BUN: 8 mg/dL (ref 6–20)
Calcium, Ion: 1.1 mmol/L — ABNORMAL LOW (ref 1.15–1.40)
Chloride: 103 mmol/L (ref 98–111)
Creatinine, Ser: 0.5 mg/dL (ref 0.44–1.00)
Glucose, Bld: 127 mg/dL — ABNORMAL HIGH (ref 70–99)
HCT: 32 % — ABNORMAL LOW (ref 36.0–46.0)
Hemoglobin: 10.9 g/dL — ABNORMAL LOW (ref 12.0–15.0)
Potassium: 4.3 mmol/L (ref 3.5–5.1)
Sodium: 137 mmol/L (ref 135–145)
TCO2: 25 mmol/L (ref 22–32)

## 2023-04-13 LAB — CBC WITH DIFFERENTIAL/PLATELET
Abs Immature Granulocytes: 0.04 10*3/uL (ref 0.00–0.07)
Basophils Absolute: 0 10*3/uL (ref 0.0–0.1)
Basophils Relative: 0 %
Eosinophils Absolute: 0.1 10*3/uL (ref 0.0–0.5)
Eosinophils Relative: 1 %
HCT: 30.5 % — ABNORMAL LOW (ref 36.0–46.0)
Hemoglobin: 8.7 g/dL — ABNORMAL LOW (ref 12.0–15.0)
Immature Granulocytes: 0 %
Lymphocytes Relative: 10 %
Lymphs Abs: 1.1 10*3/uL (ref 0.7–4.0)
MCH: 22.1 pg — ABNORMAL LOW (ref 26.0–34.0)
MCHC: 28.5 g/dL — ABNORMAL LOW (ref 30.0–36.0)
MCV: 77.4 fL — ABNORMAL LOW (ref 80.0–100.0)
Monocytes Absolute: 0.7 10*3/uL (ref 0.1–1.0)
Monocytes Relative: 7 %
Neutro Abs: 8.8 10*3/uL — ABNORMAL HIGH (ref 1.7–7.7)
Neutrophils Relative %: 82 %
Platelets: 220 10*3/uL (ref 150–400)
RBC: 3.94 MIL/uL (ref 3.87–5.11)
RDW: 19.5 % — ABNORMAL HIGH (ref 11.5–15.5)
WBC: 10.8 10*3/uL — ABNORMAL HIGH (ref 4.0–10.5)
nRBC: 0 % (ref 0.0–0.2)

## 2023-04-13 MED ORDER — LEVETIRACETAM 500 MG PO TABS: 500.0000 mg | ORAL_TABLET | Freq: Two times a day (BID) | ORAL | 0 refills | Status: DC

## 2023-04-13 MED ORDER — ONDANSETRON HCL 4 MG/2ML IJ SOLN
4.0000 mg | Freq: Once | INTRAMUSCULAR | Status: AC
  Administered 2023-04-13: 4 mg via INTRAVENOUS
  Filled 2023-04-13: qty 2

## 2023-04-13 MED ORDER — LEVETIRACETAM IN NACL 1500 MG/100ML IV SOLN
1500.0000 mg | Freq: Once | INTRAVENOUS | Status: AC
  Administered 2023-04-13: 1500 mg via INTRAVENOUS
  Filled 2023-04-13: qty 100

## 2023-04-13 NOTE — ED Provider Notes (Signed)
EMERGENCY DEPARTMENT AT [facility] Provider Note   CSN: [id number] Arrival date & time: 04/13/23  7829     History  Chief Complaint  Patient presents with   Seizures    [name] is a 57 y.o. female.  The history is provided by the patient.  Seizures Seizure activity on arrival: no   Seizure type:  Grand mal Preceding symptoms: no sensation of an aura present   Initial focality:  None Episode characteristics: generalized shaking   Severity:  Mild Timing:  Once Progression:  Resolved Context: medical compliance, not possible hypoglycemia and not pregnant   Recent head injury:  No recent head injuries PTA treatment:  None History of seizures: yes   Severity:  Mild Current therapy:  None Patient has a reported history of seizures and reportedly had a seizure in bed tonight.  Did not hit head.  No incontinence      Home Medications Prior to Admission medications   Medication Sig Start Date End Date Taking? Authorizing Provider  acetaminophen (TYLENOL) 325 MG tablet Take 650 mg by mouth every 6 (six) hours as needed for mild pain. Patient not taking: Reported on 03/29/2023    [provider]  albuterol (VENTOLIN HFA) 108 (90 Base) MCG/ACT inhaler Inhale 1-2 puffs into the lungs every 6 (six) hours as needed for wheezing or shortness of breath. 04/01/23   Lonell Grandchild, MD  ARIPiprazole (ABILIFY) 10 MG tablet Take 1 tablet (10 mg total) by mouth daily. Follow up with psychiatry outpatient for medication refills and adjustments Patient not taking: Reported on 03/29/2023 03/16/23 04/15/23  Zigmund Daniel., MD  budesonide-formoterol [facility]) 160-4.5 MCG/ACT inhaler Inhale 2 puffs into the lungs 2 (two) times daily. Patient not taking: Reported on 03/29/2023 02/25/23   Sabas Sous, MD  carbamazepine (TEGRETOL) 200 MG tablet 800mg  PO QD X 1D, then 600mg  PO QD X 1D, then 400mg  QD X 1D, then 200mg  PO QD X 2D Patient not taking:  Reported on 03/29/2023 03/18/23   Genelle Economou, MD  cyanocobalamin (VITAMIN B12) 1000 MCG tablet Take 1 tablet (1,000 mcg total) by mouth daily. Patient not taking: Reported on 03/29/2023 03/16/23 07/24/23  Zigmund Daniel., MD  diclofenac Sodium (VOLTAREN) 1 % GEL Apply 2 g topically 4 (four) times daily. Patient not taking: Reported on 03/07/2023 02/19/23   [provider]  famotidine (PEPCID) 20 MG tablet Take 1 tablet (20 mg total) by mouth daily. Patient not taking: Reported on 03/07/2023 02/25/23   Sabas Sous, MD  ferrous sulfate 325 (65 FE) MG tablet Take 1 tablet (325 mg total) by mouth every other day. Patient not taking: Reported on 03/29/2023 03/18/23 10/02/23  Zigmund Daniel., MD  folic acid (FOLVITE) 1 MG tablet Take 1 tablet (1 mg total) by mouth daily. Patient not taking: Reported on 03/30/2023 03/17/23 05/16/23  Zigmund Daniel., MD  furosemide (LASIX) 20 MG tablet Take 1 tablet (20 mg total) by mouth daily. Patient not taking: Reported on 03/07/2023 02/24/23   Achille Rich, PA-C  gabapentin (NEURONTIN) 100 MG capsule Take 100 mg by mouth 3 (three) times daily. Patient not taking: Reported on 03/07/2023 02/19/23   [provider]  hyoscyamine (LEVSIN/SL) 0.125 MG SL tablet Place 1 tablet (0.125 mg total) under the tongue every 4 (four) hours as needed (pain). Up to 1.25 mg daily Patient not taking: Reported on 02/22/2023 01/23/23   Arthor Captain, PA-C  metFORMIN (GLUCOPHAGE-XR) 750  MG 24 hr tablet Take 750 mg by mouth daily. Patient not taking: Reported on 03/29/2023 03/23/23   [provider]  nicotine (NICODERM CQ - DOSED IN MG/24 HOURS) 21 mg/24hr patch Place 1 patch (21 mg total) onto the skin daily. Patient not taking: Reported on 03/29/2023 03/16/23   Zigmund Daniel., MD  nicotine polacrilex (NICORETTE) 2 MG gum Take 1 each (2 mg total) by mouth as needed for smoking cessation. Patient not taking: Reported on 03/29/2023 03/16/23   Zigmund Daniel., MD  ondansetron (ZOFRAN) 4 MG tablet Take 1 tablet (4 mg total) by mouth every 6 (six) hours. Patient not taking: Reported on 02/22/2023 01/20/23   Prosperi, Christian H, PA-C  ondansetron (ZOFRAN-ODT) 4 MG disintegrating tablet Take 1 tablet (4 mg total) by mouth every 8 (eight) hours as needed for nausea or vomiting. Patient not taking: Reported on 03/07/2023 03/01/23   Laurence Spates, MD  OXYGEN Inhale 4-5 L into the lungs as needed (COPD).    [provider]  pantoprazole (PROTONIX) 40 MG tablet Take 1 tablet (40 mg total) by mouth daily. Patient not taking: Reported on 03/29/2023 03/17/23 04/16/23  Zigmund Daniel., MD  sucralfate (CARAFATE) 1 g tablet Take 1 tablet (1 g total) by mouth 4 (four) times daily -  with meals and at bedtime. Patient not taking: Reported on 02/22/2023 02/11/23   Horton, Mayer Masker, MD  tiZANidine (ZANAFLEX) 2 MG tablet Take 2 mg by mouth at bedtime. Patient not taking: Reported on 03/07/2023 02/19/23   [provider]  zonisamide (ZONEGRAN) 100 MG capsule Take 2 capsules (200 mg total) by mouth daily. Patient not taking: Reported on 02/22/2023 01/12/23   Lewie Chamber, MD      Allergies    Iron dextran, Aspirin, and Penicillins    Review of Systems   Review of Systems  Constitutional:  Negative for fever.  HENT:  Negative for facial swelling.   Eyes:  Negative for redness.  Respiratory:  Negative for wheezing and stridor.   Neurological:  Positive for seizures.  All other systems reviewed and are negative.   Physical Exam Updated Vital Signs BP 129/75   Pulse (!) 110   Temp 98 F (36.7 C) (Oral)   Resp 19   Ht 5\' 3"  (1.6 m)   Wt 53.8 kg   LMP 01/08/2011   SpO2 100%   BMI 21.01 kg/m  Physical Exam Vitals and nursing note reviewed.  Constitutional:      General: She is not in acute distress.    Appearance: Normal appearance. She is well-developed.  HENT:     Head: Normocephalic and atraumatic.     Nose: Nose  normal.  Eyes:     Pupils: Pupils are equal, round, and reactive to light.  Cardiovascular:     Rate and Rhythm: Normal rate and regular rhythm.     Pulses: Normal pulses.     Heart sounds: Normal heart sounds.  Pulmonary:     Effort: Pulmonary effort is normal. No respiratory distress.     Breath sounds: Normal breath sounds.  Abdominal:     General: Bowel sounds are normal. There is no distension.     Palpations: Abdomen is soft.     Tenderness: There is no abdominal tenderness. There is no guarding or rebound.  Genitourinary:    Vagina: No vaginal discharge.  Musculoskeletal:        General: Normal range of motion.  Cervical back: Neck supple.  Skin:    General: Skin is dry.     Capillary Refill: Capillary refill takes less than 2 seconds.     Findings: No erythema or rash.  Neurological:     General: No focal deficit present.     Mental Status: She is alert.     Deep Tendon Reflexes: Reflexes normal.  Psychiatric:        Mood and Affect: Mood normal.     ED Results / Procedures / Treatments   Labs (all labs ordered are listed, but only abnormal results are displayed) Results for orders placed or performed during the hospital encounter of 04/13/23  CBC with Differential  Result Value Ref Range   WBC 10.8 (H) 4.0 - 10.5 K/uL   RBC 3.94 3.87 - 5.11 MIL/uL   Hemoglobin 8.7 (L) 12.0 - 15.0 g/dL   HCT 74.2 (L) 59.5 - 63.8 %   MCV 77.4 (L) 80.0 - 100.0 fL   MCH 22.1 (L) 26.0 - 34.0 pg   MCHC 28.5 (L) 30.0 - 36.0 g/dL   RDW 75.6 (H) 43.3 - 29.5 %   Platelets 220 150 - 400 K/uL   nRBC 0.0 0.0 - 0.2 %   Neutrophils Relative % 82 %   Neutro Abs 8.8 (H) 1.7 - 7.7 K/uL   Lymphocytes Relative 10 %   Lymphs Abs 1.1 0.7 - 4.0 K/uL   Monocytes Relative 7 %   Monocytes Absolute 0.7 0.1 - 1.0 K/uL   Eosinophils Relative 1 %   Eosinophils Absolute 0.1 0.0 - 0.5 K/uL   Basophils Relative 0 %   Basophils Absolute 0.0 0.0 - 0.1 K/uL   Immature Granulocytes 0 %   Abs  Immature Granulocytes 0.04 0.00 - 0.07 K/uL  I-stat chem 8, ED (not at [facility], [facility] or [facility])  Result Value Ref Range   Sodium 137 135 - 145 mmol/L   Potassium 4.3 3.5 - 5.1 mmol/L   Chloride 103 98 - 111 mmol/L   BUN 8 6 - 20 mg/dL   Creatinine, Ser 1.88 0.44 - 1.00 mg/dL   Glucose, Bld 416 (H) 70 - 99 mg/dL   Calcium, Ion 6.06 (L) 1.15 - 1.40 mmol/L   TCO2 25 22 - 32 mmol/L   Hemoglobin 10.9 (L) 12.0 - 15.0 g/dL   HCT 30.1 (L) 60.1 - 09.3 %   DG Chest 2 View  Result Date: 04/02/2023 CLINICAL DATA:  57 year old female with shortness of breath. EXAM: CHEST - 2 VIEW COMPARISON:  Portable chest 04/01/2023 and earlier. FINDINGS: PA and lateral views at 0542 hours. Mediastinal contours are normal. Visualized tracheal air column is within normal limits. Borderline to mildly hyperinflated lungs. Symmetric increased pulmonary interstitial opacity is mild, probably chronic. No pneumothorax, pulmonary edema, pleural effusion or confluent lung opacity. Mild kyphoscoliosis. Midthoracic compression fractures. Negative visible bowel gas. IMPRESSION: 1. Stable borderline to mild hyperinflation and nonspecific interstitial lung changes. No acute cardiopulmonary abnormality. 2. Nonacute thoracic compression fractures. Electronically Signed   By: Odessa Fleming M.D.   On: 04/02/2023 05:57   DG Chest Port 1 View  Result Date: 04/01/2023 CLINICAL DATA:  Chest pain and shortness of breath. History of asthma and COPD. EXAM: PORTABLE CHEST 1 VIEW COMPARISON:  03/31/2023. FINDINGS: The heart size and mediastinal contours are within normal limits. No consolidation, effusion, or pneumothorax. No acute osseous abnormality. IMPRESSION: No active disease. Electronically Signed   By: Thornell Sartorius M.D.   On: 04/01/2023 21:06   DG  Chest 2 View  Result Date: 03/31/2023 CLINICAL DATA:  57 year old female with shortness of breath.  COPD. EXAM: CHEST - 2 VIEW COMPARISON:  Chest CTA 02/14/2023. Portable chest 03/30/2023 and earlier.  FINDINGS: PA and lateral views at 0716 hours. Lung volumes and mediastinal contours are stable and within normal limits. Mild kyphoscoliosis. Multiple stable appearing thoracic compression fractures on the lateral view. Abdominal Calcified aortic atherosclerosis. Negative visible bowel gas. Lung markings appear stable since last month. No pneumothorax, pulmonary edema, pleural effusion or confluent lung opacity. IMPRESSION: 1. No acute cardiopulmonary abnormality. 2. Aortic Atherosclerosis (ICD10-I70.0). Thoracic compression fractures. Electronically Signed   By: Odessa Fleming M.D.   On: 03/31/2023 07:54   DG Chest Port 1 View  Result Date: 03/30/2023 CLINICAL DATA:  57 year old female with history of shortness of breath. EXAM: PORTABLE CHEST 1 VIEW COMPARISON:  Chest x-ray 03/25/2023. FINDINGS: Lung volumes are normal. No consolidative airspace disease. No pleural effusions. No pneumothorax. No pulmonary nodule or mass noted. Pulmonary vasculature and the cardiomediastinal silhouette are within normal limits. IMPRESSION: No radiographic evidence of acute cardiopulmonary disease. Electronically Signed   By: Trudie Reed M.D.   On: 03/30/2023 05:28   DG Chest 1 View  Result Date: 03/25/2023 CLINICAL DATA:  Epigastric pain.  Shortness of breath. EXAM: CHEST  1 VIEW COMPARISON:  PA and lateral 03/21/2023 FINDINGS: The lungs are slightly emphysematous. Linear scarring or atelectasis noted left lung base. No focal pneumonia is evident. No substantial pleural effusion. The cardiomediastinal silhouette and vascular pattern are normal. Slight thoracic dextroscoliosis and osteopenia. IMPRESSION: No acute cardiopulmonary disease. Stable COPD changes with left base scarring or atelectasis. Slight thoracic dextroscoliosis and osteopenia. Electronically Signed   By: Almira Bar M.D.   On: 03/25/2023 05:43   DG Chest 2 View  Result Date: 03/21/2023 CLINICAL DATA:  57 year old female with shortness of breath for 3  days. EXAM: CHEST - 2 VIEW COMPARISON:  Portable chest 03/18/2023 and earlier. FINDINGS: PA and lateral views at 0719 hours. Larger lung volumes, improved lung base ventilation. Normal cardiac size and mediastinal contours. Visualized tracheal air column is within normal limits. No pneumothorax, pulmonary edema, pleural effusion or confluent lung opacity. No acute osseous abnormality identified. Negative visible bowel gas. IMPRESSION: Improved lung volumes and ventilation since 03/18/2023. No acute cardiopulmonary abnormality. Electronically Signed   By: Odessa Fleming M.D.   On: 03/21/2023 07:33   CT ABDOMEN PELVIS W CONTRAST  Result Date: 03/18/2023 CLINICAL DATA:  Bowel obstruction suspected. Vomiting. Abdominal pain. EXAM: CT ABDOMEN AND PELVIS WITH CONTRAST TECHNIQUE: Multidetector CT imaging of the abdomen and pelvis was performed using the standard protocol following bolus administration of intravenous contrast. RADIATION DOSE REDUCTION: This exam was performed according to the departmental dose-optimization program which includes automated exposure control, adjustment of the mA and/or kV according to patient size and/or use of iterative reconstruction technique. CONTRAST:  OMNIPAQUE IOHEXOL 300 MG/ML  SOLN COMPARISON:  03/01/2023 FINDINGS: Lower chest: No acute abnormality Hepatobiliary: Tiny subcentimeter low-attenuation area noted posteriorly in the right hepatic lobe is stable, difficult to characterize due to its small size but likely small cyst. No suspicious focal hepatic abnormality. Gallbladder unremarkable. Pancreas: No focal abnormality or ductal dilatation. Spleen: No focal abnormality.  Normal size. Adrenals/Urinary Tract: No adrenal abnormality. No focal renal abnormality. No stones or hydronephrosis. Urinary bladder is unremarkable. Stomach/Bowel: Prior gastric bypass. No complicating feature. No bowel obstruction. Appendix normal. Vascular/Lymphatic: Aortic atherosclerosis. No evidence of  aneurysm or adenopathy. Reproductive: Lower pelvic structures obscured  by beam hardening artifact from right hip replacement. No visible pelvic mass. Other: No free fluid or free air. Musculoskeletal: No acute bony abnormality. IMPRESSION: Prior gastric bypass. No complicating feature or evidence of bowel obstruction. Aortic atherosclerosis. No acute findings. Electronically Signed   By: Charlett Nose M.D.   On: 03/18/2023 10:56   DG Chest Portable 1 View  Result Date: 03/18/2023 CLINICAL DATA:  Weakness and shortness of breath. EXAM: PORTABLE CHEST 1 VIEW COMPARISON:  February 25, 2023 FINDINGS: The heart size and mediastinal contours are within normal limits. Low lung volumes are noted. There is no evidence of an acute infiltrate, pleural effusion or pneumothorax. The visualized skeletal structures are unremarkable. IMPRESSION: Low lung volumes without acute or active cardiopulmonary disease. Electronically Signed   By: Aram Candela M.D.   On: 03/18/2023 03:46    EKG   Radiology No results found.  Procedures Procedures    Medications Ordered in ED Medications  levETIRAcetam (KEPPRA) IVPB 1500 mg/ 100 mL premix (has no administration in time range)    ED Course/ Medical Decision Making/ A&P                                 Medical Decision Making Patient with seizure witnessed at home   Amount and/or Complexity of Data Reviewed Independent Historian: EMS    Details: See above  External Data Reviewed: labs and notes.    Details: Previous notes and labs reviewed  Labs: ordered.    Details: White count 10.8, hemoglobin low 8.7 but stable from multiple previous in our system, normal platelets. Normal sodium 137, normal potassium 4.3, normal creatinine   Risk Prescription drug management. Risk Details: Loaded with keppra, no further seizures in the ED.  No driving for 6 months this was informed verbally to the patient and on the discharge     Final Clinical Impression(s) / ED  Diagnoses Final diagnoses:  None   Return for intractable cough, coughing up blood, fevers > 100.4 unrelieved by medication, shortness of breath, intractable vomiting, chest pain, shortness of breath, weakness, numbness, changes in speech, facial asymmetry, abdominal pain, passing out, Inability to tolerate liquids or food, cough, altered mental status or any concerns. No signs of systemic illness or infection. The patient is nontoxic-appearing on exam and vital signs are within normal limits.  I have reviewed the triage vital signs and the nursing notes. Pertinent labs & imaging results that were available during my care of the patient were reviewed by me and considered in my medical decision making (see chart for details). After history, exam, and medical workup I feel the patient has been appropriately medically screened and is safe for discharge home. Pertinent diagnoses were discussed with the patient. Patient was given return precautions.  Rx / DC Orders ED Discharge Orders     None         Dragon Thrush, MD 04/13/23 [id number]

## 2023-04-13 NOTE — Discharge Instructions (Signed)
No driving for 6 months or until cleared by neurology  

## 2023-04-13 NOTE — ED Triage Notes (Addendum)
Pt BIB  [facility] d/t possible seizure.  Husband states pt was sleeping and then began shaking uncontrollably.  Pt has hx of seizures per husband. No injuries noted d/t being in bed during episode.

## 2023-04-16 ENCOUNTER — Emergency Department (HOSPITAL_COMMUNITY)
Admission: EM | Admit: 2023-04-16 | Discharge: 2023-04-17 | Disposition: A | Discharge status: Home / Self Care | Payer: 59 | Source: Home / Self Care | Attending: Emergency Medicine | Admitting: Emergency Medicine

## 2023-04-16 ENCOUNTER — Emergency Department (HOSPITAL_COMMUNITY): Payer: 59

## 2023-04-16 ENCOUNTER — Emergency Department (HOSPITAL_COMMUNITY)
Admission: EM | Admit: 2023-04-16 | Discharge: 2023-04-16 | Disposition: A | Discharge status: Home / Self Care | Payer: 59 | Attending: Emergency Medicine | Admitting: Emergency Medicine

## 2023-04-16 ENCOUNTER — Encounter (HOSPITAL_COMMUNITY): Payer: Self-pay

## 2023-04-16 DIAGNOSIS — Z743 Need for continuous supervision: Secondary | ICD-10-CM | POA: Diagnosis not present

## 2023-04-16 DIAGNOSIS — E039 Hypothyroidism, unspecified: Secondary | ICD-10-CM | POA: Insufficient documentation

## 2023-04-16 DIAGNOSIS — M549 Dorsalgia, unspecified: Secondary | ICD-10-CM | POA: Insufficient documentation

## 2023-04-16 DIAGNOSIS — R062 Wheezing: Secondary | ICD-10-CM | POA: Diagnosis not present

## 2023-04-16 DIAGNOSIS — E1140 Type 2 diabetes mellitus with diabetic neuropathy, unspecified: Secondary | ICD-10-CM | POA: Insufficient documentation

## 2023-04-16 DIAGNOSIS — Z9981 Dependence on supplemental oxygen: Secondary | ICD-10-CM | POA: Diagnosis not present

## 2023-04-16 DIAGNOSIS — R109 Unspecified abdominal pain: Secondary | ICD-10-CM | POA: Diagnosis not present

## 2023-04-16 DIAGNOSIS — Z7984 Long term (current) use of oral hypoglycemic drugs: Secondary | ICD-10-CM | POA: Insufficient documentation

## 2023-04-16 DIAGNOSIS — R0602 Shortness of breath: Secondary | ICD-10-CM | POA: Insufficient documentation

## 2023-04-16 DIAGNOSIS — Z812 Family history of tobacco abuse and dependence: Secondary | ICD-10-CM | POA: Insufficient documentation

## 2023-04-16 DIAGNOSIS — I499 Cardiac arrhythmia, unspecified: Secondary | ICD-10-CM | POA: Diagnosis not present

## 2023-04-16 DIAGNOSIS — I517 Cardiomegaly: Secondary | ICD-10-CM | POA: Diagnosis not present

## 2023-04-16 DIAGNOSIS — I10 Essential (primary) hypertension: Secondary | ICD-10-CM | POA: Insufficient documentation

## 2023-04-16 DIAGNOSIS — I4891 Unspecified atrial fibrillation: Secondary | ICD-10-CM | POA: Insufficient documentation

## 2023-04-16 DIAGNOSIS — J441 Chronic obstructive pulmonary disease with (acute) exacerbation: Secondary | ICD-10-CM | POA: Diagnosis not present

## 2023-04-16 DIAGNOSIS — J9811 Atelectasis: Secondary | ICD-10-CM | POA: Diagnosis not present

## 2023-04-16 DIAGNOSIS — M545 Low back pain, unspecified: Secondary | ICD-10-CM | POA: Diagnosis not present

## 2023-04-16 DIAGNOSIS — G8929 Other chronic pain: Secondary | ICD-10-CM | POA: Insufficient documentation

## 2023-04-16 DIAGNOSIS — J449 Chronic obstructive pulmonary disease, unspecified: Secondary | ICD-10-CM | POA: Insufficient documentation

## 2023-04-16 DIAGNOSIS — J9610 Chronic respiratory failure, unspecified whether with hypoxia or hypercapnia: Secondary | ICD-10-CM | POA: Diagnosis not present

## 2023-04-16 DIAGNOSIS — R110 Nausea: Secondary | ICD-10-CM | POA: Diagnosis not present

## 2023-04-16 LAB — CBC WITH DIFFERENTIAL/PLATELET
Abs Immature Granulocytes: 0.01 10*3/uL (ref 0.00–0.07)
Basophils Absolute: 0 10*3/uL (ref 0.0–0.1)
Basophils Relative: 1 %
Eosinophils Absolute: 0.1 10*3/uL (ref 0.0–0.5)
Eosinophils Relative: 2 %
HCT: 28.3 % — ABNORMAL LOW (ref 36.0–46.0)
Hemoglobin: 8 g/dL — ABNORMAL LOW (ref 12.0–15.0)
Immature Granulocytes: 0 %
Lymphocytes Relative: 46 %
Lymphs Abs: 1.9 10*3/uL (ref 0.7–4.0)
MCH: 21.6 pg — ABNORMAL LOW (ref 26.0–34.0)
MCHC: 28.3 g/dL — ABNORMAL LOW (ref 30.0–36.0)
MCV: 76.5 fL — ABNORMAL LOW (ref 80.0–100.0)
Monocytes Absolute: 0.5 10*3/uL (ref 0.1–1.0)
Monocytes Relative: 11 %
Neutro Abs: 1.7 10*3/uL (ref 1.7–7.7)
Neutrophils Relative %: 40 %
Platelets: 221 10*3/uL (ref 150–400)
RBC: 3.7 MIL/uL — ABNORMAL LOW (ref 3.87–5.11)
RDW: 19.2 % — ABNORMAL HIGH (ref 11.5–15.5)
WBC: 4.2 10*3/uL (ref 4.0–10.5)
nRBC: 0 % (ref 0.0–0.2)

## 2023-04-16 LAB — PROTIME-INR
INR: 1 (ref 0.8–1.2)
Prothrombin Time: 13.6 s (ref 11.4–15.2)

## 2023-04-16 LAB — URINALYSIS, ROUTINE W REFLEX MICROSCOPIC
Bilirubin Urine: NEGATIVE
Glucose, UA: 50 mg/dL — AB
Hgb urine dipstick: NEGATIVE
Ketones, ur: NEGATIVE mg/dL
Leukocytes,Ua: NEGATIVE
Nitrite: NEGATIVE
Protein, ur: NEGATIVE mg/dL
Specific Gravity, Urine: 1.011 (ref 1.005–1.030)
pH: 5 (ref 5.0–8.0)

## 2023-04-16 LAB — COMPREHENSIVE METABOLIC PANEL
ALT: 33 U/L (ref 0–44)
AST: 115 U/L — ABNORMAL HIGH (ref 15–41)
Albumin: 3.1 g/dL — ABNORMAL LOW (ref 3.5–5.0)
Alkaline Phosphatase: 77 U/L (ref 38–126)
Anion gap: 9 (ref 5–15)
BUN: 6 mg/dL (ref 6–20)
CO2: 23 mmol/L (ref 22–32)
Calcium: 8.1 mg/dL — ABNORMAL LOW (ref 8.9–10.3)
Chloride: 106 mmol/L (ref 98–111)
Creatinine, Ser: 0.4 mg/dL — ABNORMAL LOW (ref 0.44–1.00)
GFR, Estimated: >60 mL/min (ref >60)
Glucose, Bld: 130 mg/dL — ABNORMAL HIGH (ref 70–99)
Potassium: 3.8 mmol/L (ref 3.5–5.1)
Sodium: 138 mmol/L (ref 135–145)
Total Bilirubin: 0.4 mg/dL (ref 0.3–1.2)
Total Protein: 6.3 g/dL — ABNORMAL LOW (ref 6.5–8.1)

## 2023-04-16 LAB — AMMONIA: Ammonia: 22 umol/L (ref 9–35)

## 2023-04-16 LAB — RAPID URINE DRUG SCREEN, HOSP PERFORMED
Amphetamines: NOT DETECTED
Barbiturates: NOT DETECTED
Benzodiazepines: NOT DETECTED
Cocaine: NOT DETECTED
Opiates: NOT DETECTED
Tetrahydrocannabinol: NOT DETECTED

## 2023-04-16 LAB — TROPONIN I (HIGH SENSITIVITY)
Troponin I (High Sensitivity): 3 ng/L (ref <18)
Troponin I (High Sensitivity): 3 ng/L (ref <18)

## 2023-04-16 LAB — MAGNESIUM: Magnesium: 1.8 mg/dL (ref 1.7–2.4)

## 2023-04-16 LAB — ETHANOL: Alcohol, Ethyl (B): 41 mg/dL — ABNORMAL HIGH (ref <10)

## 2023-04-16 LAB — LIPASE, BLOOD: Lipase: 43 U/L (ref 11–51)

## 2023-04-16 MED ORDER — DOXYCYCLINE HYCLATE 100 MG PO CAPS: 100.0000 mg | ORAL_CAPSULE | Freq: Two times a day (BID) | ORAL | 0 refills | Status: DC

## 2023-04-16 MED ORDER — PREDNISONE 20 MG PO TABS: 40.0000 mg | ORAL_TABLET | Freq: Every day | ORAL | 0 refills | Status: DC

## 2023-04-16 NOTE — ED Provider Notes (Signed)
[place] EMERGENCY DEPARTMENT AT [facility] Provider Note   CSN: [id number] Arrival date & time: 04/16/23  0047     History  Chief Complaint  Patient presents with   Shortness of Breath   Nausea    [name] is a 57 y.o. female.  Presents to the emergency department for evaluation of shortness of breath.  Patient does report that she had some nausea earlier but that resolved after she was given Zofran by EMS.  She has a history of COPD.  Patient has had increased cough.       Home Medications Prior to Admission medications   Medication Sig Start Date End Date Taking? Authorizing Provider  doxycycline (VIBRAMYCIN) 100 MG capsule Take 1 capsule (100 mg total) by mouth 2 (two) times daily. 04/16/23  Yes Keelan Tripodi, Canary Brim, MD  predniSONE (DELTASONE) 20 MG tablet Take 2 tablets (40 mg total) by mouth daily with breakfast. 04/16/23  Yes Zayan Delvecchio, Canary Brim, MD  acetaminophen (TYLENOL) 325 MG tablet Take 650 mg by mouth every 6 (six) hours as needed for mild pain. Patient not taking: Reported on 03/29/2023    [provider]  albuterol (VENTOLIN HFA) 108 (90 Base) MCG/ACT inhaler Inhale 1-2 puffs into the lungs every 6 (six) hours as needed for wheezing or shortness of breath. 04/01/23   Lonell Grandchild, MD  ARIPiprazole (ABILIFY) 10 MG tablet Take 1 tablet (10 mg total) by mouth daily. Follow up with psychiatry outpatient for medication refills and adjustments Patient not taking: Reported on 03/29/2023 03/16/23 04/15/23  Zigmund Daniel., MD  budesonide-formoterol [facility]) 160-4.5 MCG/ACT inhaler Inhale 2 puffs into the lungs 2 (two) times daily. Patient not taking: Reported on 03/29/2023 02/25/23   Sabas Sous, MD  carbamazepine (TEGRETOL) 200 MG tablet 800mg  PO QD X 1D, then 600mg  PO QD X 1D, then 400mg  QD X 1D, then 200mg  PO QD X 2D Patient not taking: Reported on 03/29/2023 03/18/23   Palumbo, April, MD  cyanocobalamin (VITAMIN B12) 1000  MCG tablet Take 1 tablet (1,000 mcg total) by mouth daily. Patient not taking: Reported on 03/29/2023 03/16/23 07/24/23  Zigmund Daniel., MD  diclofenac Sodium (VOLTAREN) 1 % GEL Apply 2 g topically 4 (four) times daily. Patient not taking: Reported on 03/07/2023 02/19/23   [provider]  famotidine (PEPCID) 20 MG tablet Take 1 tablet (20 mg total) by mouth daily. Patient not taking: Reported on 03/07/2023 02/25/23   Sabas Sous, MD  ferrous sulfate 325 (65 FE) MG tablet Take 1 tablet (325 mg total) by mouth every other day. Patient not taking: Reported on 03/29/2023 03/18/23 10/02/23  Zigmund Daniel., MD  folic acid (FOLVITE) 1 MG tablet Take 1 tablet (1 mg total) by mouth daily. Patient not taking: Reported on 03/30/2023 03/17/23 05/16/23  Zigmund Daniel., MD  furosemide (LASIX) 20 MG tablet Take 1 tablet (20 mg total) by mouth daily. Patient not taking: Reported on 03/07/2023 02/24/23   Achille Rich, PA-C  gabapentin (NEURONTIN) 100 MG capsule Take 100 mg by mouth 3 (three) times daily. Patient not taking: Reported on 03/07/2023 02/19/23   [provider]  hyoscyamine (LEVSIN/SL) 0.125 MG SL tablet Place 1 tablet (0.125 mg total) under the tongue every 4 (four) hours as needed (pain). Up to 1.25 mg daily Patient not taking: Reported on 02/22/2023 01/23/23   Arthor Captain, PA-C  levETIRAcetam (KEPPRA) 500 MG tablet Take 1 tablet (500 mg total) by mouth  2 (two) times daily. 04/13/23   Palumbo, April, MD  metFORMIN (GLUCOPHAGE-XR) 750 MG 24 hr tablet Take 750 mg by mouth daily. Patient not taking: Reported on 03/29/2023 03/23/23   [provider]  nicotine (NICODERM CQ - DOSED IN MG/24 HOURS) 21 mg/24hr patch Place 1 patch (21 mg total) onto the skin daily. Patient not taking: Reported on 03/29/2023 03/16/23   Zigmund Daniel., MD  nicotine polacrilex (NICORETTE) 2 MG gum Take 1 each (2 mg total) by mouth as needed for smoking cessation. Patient not taking:  Reported on 03/29/2023 03/16/23   Zigmund Daniel., MD  ondansetron (ZOFRAN) 4 MG tablet Take 1 tablet (4 mg total) by mouth every 6 (six) hours. Patient not taking: Reported on 02/22/2023 01/20/23   Prosperi, Christian H, PA-C  ondansetron (ZOFRAN-ODT) 4 MG disintegrating tablet Take 1 tablet (4 mg total) by mouth every 8 (eight) hours as needed for nausea or vomiting. Patient not taking: Reported on 03/07/2023 03/01/23   Laurence Spates, MD  OXYGEN Inhale 4-5 L into the lungs as needed (COPD).    [provider]  pantoprazole (PROTONIX) 40 MG tablet Take 1 tablet (40 mg total) by mouth daily. Patient not taking: Reported on 03/29/2023 03/17/23 04/16/23  Zigmund Daniel., MD  sucralfate (CARAFATE) 1 g tablet Take 1 tablet (1 g total) by mouth 4 (four) times daily -  with meals and at bedtime. Patient not taking: Reported on 02/22/2023 02/11/23   Horton, Mayer Masker, MD  tiZANidine (ZANAFLEX) 2 MG tablet Take 2 mg by mouth at bedtime. Patient not taking: Reported on 03/07/2023 02/19/23   [provider]  zonisamide (ZONEGRAN) 100 MG capsule Take 2 capsules (200 mg total) by mouth daily. Patient not taking: Reported on 02/22/2023 01/12/23   Lewie Chamber, MD      Allergies    Iron dextran, Aspirin, and Penicillins    Review of Systems   Review of Systems  Physical Exam Updated Vital Signs BP 113/65 (BP Location: Right Arm)   Pulse (!) 113   Temp 98.5 F (36.9 C) (Oral)   Resp 16   LMP 01/08/2011   SpO2 100%  Physical Exam Vitals and nursing note reviewed.  Constitutional:      General: She is not in acute distress.    Appearance: She is well-developed.  HENT:     Head: Normocephalic and atraumatic.     Mouth/Throat:     Mouth: Mucous membranes are moist.  Eyes:     General: Vision grossly intact. Gaze aligned appropriately.     Extraocular Movements: Extraocular movements intact.     Conjunctiva/sclera: Conjunctivae normal.  Cardiovascular:     Rate and  Rhythm: Normal rate and regular rhythm.     Pulses: Normal pulses.     Heart sounds: Normal heart sounds, S1 normal and S2 normal. No murmur heard.    No friction rub. No gallop.  Pulmonary:     Effort: Pulmonary effort is normal. No respiratory distress.     Breath sounds: Wheezing present.  Abdominal:     General: Bowel sounds are normal.     Palpations: Abdomen is soft.     Tenderness: There is no abdominal tenderness. There is no guarding or rebound.     Hernia: No hernia is present.  Musculoskeletal:        General: No swelling.     Cervical back: Full passive range of motion without pain, normal range of motion and neck  supple. No spinous process tenderness or muscular tenderness. Normal range of motion.     Right lower leg: No edema.     Left lower leg: No edema.  Skin:    General: Skin is warm and dry.     Capillary Refill: Capillary refill takes less than 2 seconds.     Findings: No ecchymosis, erythema, rash or wound.  Neurological:     General: No focal deficit present.     Mental Status: She is alert and oriented to person, place, and time.     GCS: GCS eye subscore is 4. GCS verbal subscore is 5. GCS motor subscore is 6.     Cranial Nerves: Cranial nerves 2-12 are intact.     Sensory: Sensation is intact.     Motor: Motor function is intact.     Coordination: Coordination is intact.  Psychiatric:        Attention and Perception: Attention normal.        Mood and Affect: Mood normal.        Speech: Speech normal.        Behavior: Behavior normal.     ED Results / Procedures / Treatments   Labs (all labs ordered are listed, but only abnormal results are displayed) Labs Reviewed  CBC WITH DIFFERENTIAL/PLATELET - Abnormal; Notable for the following components:      Result Value   RBC 3.70 (*)    Hemoglobin 8.0 (*)    HCT 28.3 (*)    MCV 76.5 (*)    MCH 21.6 (*)    MCHC 28.3 (*)    RDW 19.2 (*)    All other components within normal limits  COMPREHENSIVE  METABOLIC PANEL - Abnormal; Notable for the following components:   Glucose, Bld 130 (*)    Creatinine, Ser 0.40 (*)    Calcium 8.1 (*)    Total Protein 6.3 (*)    Albumin 3.1 (*)    AST 115 (*)    All other components within normal limits  URINALYSIS, ROUTINE W REFLEX MICROSCOPIC - Abnormal; Notable for the following components:   Glucose, UA 50 (*)    All other components within normal limits  ETHANOL - Abnormal; Notable for the following components:   Alcohol, Ethyl (B) 41 (*)    All other components within normal limits  LIPASE, BLOOD  MAGNESIUM  PROTIME-INR  AMMONIA  RAPID URINE DRUG SCREEN, HOSP PERFORMED  TROPONIN I (HIGH SENSITIVITY)  TROPONIN I (HIGH SENSITIVITY)    EKG EKG Interpretation Date/Time:  Thursday April 16 2023 00:58:39 EDT Ventricular Rate:  107 PR Interval:  133 QRS Duration:  92 QT Interval:  332 QTC Calculation: 443 R Axis:   26  Text Interpretation: Sinus tachycardia LAE, consider biatrial enlargement Low voltage, precordial leads RSR' in V1 or V2, probably normal variant Confirmed by Gilda Crease [phone]) on 04/16/2023 2:56:24 AM  Radiology DG Chest Port 1 View  Result Date: 04/16/2023 CLINICAL DATA:  Shortness of breath EXAM: PORTABLE CHEST 1 VIEW COMPARISON:  04/02/2023 FINDINGS: The heart size and mediastinal contours are within normal limits. Both lungs are clear. The visualized skeletal structures are unremarkable. IMPRESSION: No active disease. Electronically Signed   By: Alcide Clever M.D.   On: 04/16/2023 01:31    Procedures Procedures    Medications Ordered in ED Medications - No data to display  ED Course/ Medical Decision Making/ A&P  Medical Decision Making Amount and/or Complexity of Data Reviewed Labs: ordered. Radiology: ordered.   Differential Diagnosis considered includes, but not limited to: COPD exacerbation; Bronchitis; Pneumonia; CHF; ACS; PE  Presents with cough,  shortness of breath.  She has a history of COPD.  Patient does have some wheezing at arrival but is moving air well and is not hypoxic.  Workup has been reassuring.  No pneumonia on x-ray.  Cardiac evaluation negative.  Treat for COPD.        Final Clinical Impression(s) / ED Diagnoses Final diagnoses:  COPD exacerbation (HCC)    Rx / DC Orders ED Discharge Orders          Ordered    doxycycline (VIBRAMYCIN) 100 MG capsule  2 times daily        04/16/23 0621    predniSONE (DELTASONE) 20 MG tablet  Daily with breakfast        04/16/23 0621              Gilda Crease, MD 04/16/23 [phone]

## 2023-04-16 NOTE — ED Notes (Signed)
Patient declines vitals, "is ready to go" - verbalizes understanding of discharge instructions. Opportunity for questioning and answers were provided. Armband removed by staff, pt discharged from ED. Ambulated out to lobby, awaiting bus ride.

## 2023-04-16 NOTE — ED Triage Notes (Signed)
Pt BIB [facility] c/o sob x 30 mins. Per EMS pt wheezy in all fields on arrival, they gave 1 duoneb pt improved. Also c/o lower back pain and nausea, denies falling. Pt on 4L [place] at home, arrives on same.

## 2023-04-16 NOTE — ED Triage Notes (Signed)
PT BIB [facility] from home with c/o shortness of breath and nasuea. 4 mg IM zofran given en route.   EMS vitals: BP 120/70 HR 104 O2 92% room air

## 2023-04-17 ENCOUNTER — Emergency Department (HOSPITAL_COMMUNITY): Payer: 59

## 2023-04-17 ENCOUNTER — Other Ambulatory Visit: Payer: Self-pay

## 2023-04-17 ENCOUNTER — Encounter (HOSPITAL_COMMUNITY): Payer: Self-pay

## 2023-04-17 DIAGNOSIS — R0602 Shortness of breath: Secondary | ICD-10-CM | POA: Diagnosis not present

## 2023-04-17 DIAGNOSIS — I517 Cardiomegaly: Secondary | ICD-10-CM | POA: Diagnosis not present

## 2023-04-17 DIAGNOSIS — J9811 Atelectasis: Secondary | ICD-10-CM | POA: Diagnosis not present

## 2023-04-17 NOTE — ED Provider Notes (Signed)
[place] EMERGENCY DEPARTMENT AT [facility] Provider Note   CSN: [id number] Arrival date & time: 04/16/23  2350     History  Chief Complaint  Patient presents with   Shortness of Breath    [name] is a 57 y.o. female.   Shortness of Breath Associated symptoms: no fever    Patient with chronic medical conditions including COPD and chronic respiratory failure presents for her 65 ER visit in 6 months.  Patient was just seen within the past 24 hours.  Patient reports she was at home sleeping when she woke up short of breath.  She also had a flareup of her chronic back and abdominal pain.  No chest pain, no significant cough.  She is now feeling improved after treatments from EMS.  She is on chronic oxygen at home   Past Medical History:  Diagnosis Date   Abdominal pain    Accidental drug overdose April 2013   Anxiety    Atrial fibrillation (HCC) 09/29/11   converted spontaneously   Chronic back pain    Chronic knee pain    Chronic nausea    Chronic pain    COPD (chronic obstructive pulmonary disease) (HCC)    Depression    Diabetes mellitus    states her doctor took her off all DM meds in past month   Diabetic neuropathy (HCC)    Dyspnea    with exertion    GERD (gastroesophageal reflux disease)    Headache(784.0)    migraines    HTN (hypertension)    not on meds since in a year    Hyperlipidemia    Hypothyroidism    not on meds in a while    Mental disorder    Bipolar and schizophrenic   Nausea and vomiting 01/02/2023   Requires supplemental oxygen    as needed per patient    Schizophrenia (HCC)    Schizophrenia, acute (HCC) 11/13/2017   Tobacco abuse     Home Medications Prior to Admission medications   Medication Sig Start Date End Date Taking? Authorizing Provider  acetaminophen (TYLENOL) 325 MG tablet Take 650 mg by mouth every 6 (six) hours as needed for mild pain. Patient not taking: Reported on 03/29/2023    [provider]   albuterol (VENTOLIN HFA) 108 (90 Base) MCG/ACT inhaler Inhale 1-2 puffs into the lungs every 6 (six) hours as needed for wheezing or shortness of breath. 04/01/23   Lonell Grandchild, MD  ARIPiprazole (ABILIFY) 10 MG tablet Take 1 tablet (10 mg total) by mouth daily. Follow up with psychiatry outpatient for medication refills and adjustments Patient not taking: Reported on 03/29/2023 03/16/23 04/15/23  Zigmund Daniel., MD  budesonide-formoterol [facility]) 160-4.5 MCG/ACT inhaler Inhale 2 puffs into the lungs 2 (two) times daily. Patient not taking: Reported on 03/29/2023 02/25/23   Sabas Sous, MD  carbamazepine (TEGRETOL) 200 MG tablet 800mg  PO QD X 1D, then 600mg  PO QD X 1D, then 400mg  QD X 1D, then 200mg  PO QD X 2D Patient not taking: Reported on 03/29/2023 03/18/23   Palumbo, April, MD  cyanocobalamin (VITAMIN B12) 1000 MCG tablet Take 1 tablet (1,000 mcg total) by mouth daily. Patient not taking: Reported on 03/29/2023 03/16/23 07/24/23  Zigmund Daniel., MD  diclofenac Sodium (VOLTAREN) 1 % GEL Apply 2 g topically 4 (four) times daily. Patient not taking: Reported on 03/07/2023 02/19/23   [provider]  doxycycline (VIBRAMYCIN) 100 MG capsule Take 1 capsule (  100 mg total) by mouth 2 (two) times daily. 04/16/23   Gilda Crease, MD  famotidine (PEPCID) 20 MG tablet Take 1 tablet (20 mg total) by mouth daily. Patient not taking: Reported on 03/07/2023 02/25/23   Sabas Sous, MD  ferrous sulfate 325 (65 FE) MG tablet Take 1 tablet (325 mg total) by mouth every other day. Patient not taking: Reported on 03/29/2023 03/18/23 10/02/23  Zigmund Daniel., MD  folic acid (FOLVITE) 1 MG tablet Take 1 tablet (1 mg total) by mouth daily. Patient not taking: Reported on 03/30/2023 03/17/23 05/16/23  Zigmund Daniel., MD  furosemide (LASIX) 20 MG tablet Take 1 tablet (20 mg total) by mouth daily. Patient not taking: Reported on 03/07/2023 02/24/23   Achille Rich, PA-C   gabapentin (NEURONTIN) 100 MG capsule Take 100 mg by mouth 3 (three) times daily. Patient not taking: Reported on 03/07/2023 02/19/23   [provider]  hyoscyamine (LEVSIN/SL) 0.125 MG SL tablet Place 1 tablet (0.125 mg total) under the tongue every 4 (four) hours as needed (pain). Up to 1.25 mg daily Patient not taking: Reported on 02/22/2023 01/23/23   Arthor Captain, PA-C  levETIRAcetam (KEPPRA) 500 MG tablet Take 1 tablet (500 mg total) by mouth 2 (two) times daily. 04/13/23   Palumbo, April, MD  metFORMIN (GLUCOPHAGE-XR) 750 MG 24 hr tablet Take 750 mg by mouth daily. Patient not taking: Reported on 03/29/2023 03/23/23   [provider]  nicotine (NICODERM CQ - DOSED IN MG/24 HOURS) 21 mg/24hr patch Place 1 patch (21 mg total) onto the skin daily. Patient not taking: Reported on 03/29/2023 03/16/23   Zigmund Daniel., MD  nicotine polacrilex (NICORETTE) 2 MG gum Take 1 each (2 mg total) by mouth as needed for smoking cessation. Patient not taking: Reported on 03/29/2023 03/16/23   Zigmund Daniel., MD  ondansetron (ZOFRAN) 4 MG tablet Take 1 tablet (4 mg total) by mouth every 6 (six) hours. Patient not taking: Reported on 02/22/2023 01/20/23   Prosperi, Christian H, PA-C  ondansetron (ZOFRAN-ODT) 4 MG disintegrating tablet Take 1 tablet (4 mg total) by mouth every 8 (eight) hours as needed for nausea or vomiting. Patient not taking: Reported on 03/07/2023 03/01/23   Laurence Spates, MD  OXYGEN Inhale 4-5 L into the lungs as needed (COPD).    [provider]  pantoprazole (PROTONIX) 40 MG tablet Take 1 tablet (40 mg total) by mouth daily. Patient not taking: Reported on 03/29/2023 03/17/23 04/16/23  Zigmund Daniel., MD  predniSONE (DELTASONE) 20 MG tablet Take 2 tablets (40 mg total) by mouth daily with breakfast. 04/16/23   Pollina, Canary Brim, MD  sucralfate (CARAFATE) 1 g tablet Take 1 tablet (1 g total) by mouth 4 (four) times daily -  with meals and at  bedtime. Patient not taking: Reported on 02/22/2023 02/11/23   Horton, Mayer Masker, MD  tiZANidine (ZANAFLEX) 2 MG tablet Take 2 mg by mouth at bedtime. Patient not taking: Reported on 03/07/2023 02/19/23   [provider]  zonisamide (ZONEGRAN) 100 MG capsule Take 2 capsules (200 mg total) by mouth daily. Patient not taking: Reported on 02/22/2023 01/12/23   Lewie Chamber, MD      Allergies    Iron dextran, Aspirin, and Penicillins    Review of Systems   Review of Systems  Constitutional:  Negative for fever.  Respiratory:  Positive for shortness of breath.     Physical Exam Updated Vital Signs  BP 139/68   Pulse (!) 112   Temp 98.3 F (36.8 C) (Oral)   Resp 17   Ht 1.575 m (5\' 2" )   Wt 53 kg   LMP 01/08/2011   SpO2 94%   BMI 21.37 kg/m  Physical Exam CONSTITUTIONAL: Chronically ill-appearing, no acute distress HEAD: Normocephalic/atraumatic EYES: EOMI/PERRL ENMT: Mucous membranes moist NECK: supple no meningeal signs CV: S1/S2 noted, tachycardia LUNGS: No significant wheezing, bibasilar crackles ABDOMEN: soft, nontender, no rebound or guarding, bowel sounds noted throughout abdomen.  Multiple healed scars noted NEURO: Pt is awake/alert/appropriate, moves all extremitiesx4.  No facial droop.   EXTREMITIES: pulses normal/equal, full ROM, no lower extremity edema SKIN: warm, color normal PSYCH: no abnormalities of mood noted, alert and oriented to situation  ED Results / Procedures / Treatments   Labs (all labs ordered are listed, but only abnormal results are displayed) Labs Reviewed - No data to display  EKG EKG Interpretation Date/Time:  Friday April 17 2023 00:00:20 EDT Ventricular Rate:  108 PR Interval:  134 QRS Duration:  92 QT Interval:  333 QTC Calculation: 447 R Axis:   2  Text Interpretation: Sinus tachycardia Left atrial enlargement Low voltage, precordial leads No significant change since last tracing Confirmed by Zadie Rhine ([id number]) on  04/17/2023 12:08:30 AM  Radiology DG Chest Portable 1 View  Result Date: 04/17/2023 CLINICAL DATA:  Shortness of breath EXAM: PORTABLE CHEST 1 VIEW COMPARISON:  04/16/2023 FINDINGS: Shallow inspiration. Cardiac enlargement. Probable linear atelectasis in the lung bases. No pleural effusions. No pneumothorax. No focal consolidation or airspace disease in the lungs. No change. IMPRESSION: Shallow inspiration with linear atelectasis in the lung bases. Electronically Signed   By: Burman Nieves M.D.   On: 04/17/2023 00:46   DG Chest Port 1 View  Result Date: 04/16/2023 CLINICAL DATA:  Shortness of breath EXAM: PORTABLE CHEST 1 VIEW COMPARISON:  04/02/2023 FINDINGS: The heart size and mediastinal contours are within normal limits. Both lungs are clear. The visualized skeletal structures are unremarkable. IMPRESSION: No active disease. Electronically Signed   By: Alcide Clever M.D.   On: 04/16/2023 01:31    Procedures Procedures    Medications Ordered in ED Medications - No data to display  ED Course/ Medical Decision Making/ A&P Clinical Course as of 04/17/23 0058  Fri Apr 17, 2023  0012 Overall patient is in no acute distress.  She is already received breathing treatments prior to my evaluation.  She is on her chronic home oxygen 4 L.  Reports chronic abdominal pain and back pain but overall in no distress.  Due to crackles on exam, will obtain x-ray and reassess [DW]  0057 Patient resting comfortably, no acute distress.  X-rays been reviewed and unremarkable.  No signs of respiratory distress.  No new pain complaints and she is now requesting food and water Patient will be discharged [DW]    Clinical Course User Index [DW] Zadie Rhine, MD                                 Medical Decision Making Amount and/or Complexity of Data Reviewed Radiology: ordered.   This patient presents to the ED for concern of shortness of breath, this involves an extensive number of treatment  options, and is a complaint that carries with it a high risk of complications and morbidity.  The differential diagnosis includes but is not limited to Acute coronary syndrome, pneumonia, acute pulmonary  edema, pneumothorax, acute anemia, pulmonary embolism   Comorbidities that complicate the patient evaluation: Patient's presentation is complicated by their history of COPD  Social Determinants of Health: Patient's  multiple ER visit   increases the complexity of managing their presentation  Additional history obtained: Records reviewed  previous records reviewed  Imaging Studies ordered: I ordered imaging studies including X-ray chest   I independently visualized and interpreted imaging which showed no acute findings I agree with the radiologist interpretation   Reevaluation: After the interventions noted above, I reevaluated the patient and found that they have :improved  Complexity of problems addressed: Patient's presentation is most consistent with  exacerbation of chronic illness  Disposition: After consideration of the diagnostic results and the patient's response to treatment,  I feel that the patent would benefit from discharge   .           Final Clinical Impression(s) / ED Diagnoses Final diagnoses:  SOB (shortness of breath)    Rx / DC Orders ED Discharge Orders     None         Zadie Rhine, MD 04/17/23 [phone]

## 2023-04-18 ENCOUNTER — Other Ambulatory Visit: Payer: Self-pay

## 2023-04-18 ENCOUNTER — Emergency Department (HOSPITAL_COMMUNITY)
Admission: EM | Admit: 2023-04-18 | Discharge: 2023-04-18 | Disposition: A | Discharge status: Home / Self Care | Payer: 59 | Attending: Emergency Medicine | Admitting: Emergency Medicine

## 2023-04-18 ENCOUNTER — Encounter (HOSPITAL_COMMUNITY): Payer: Self-pay

## 2023-04-18 DIAGNOSIS — J441 Chronic obstructive pulmonary disease with (acute) exacerbation: Secondary | ICD-10-CM | POA: Diagnosis not present

## 2023-04-18 DIAGNOSIS — Z79899 Other long term (current) drug therapy: Secondary | ICD-10-CM | POA: Diagnosis not present

## 2023-04-18 DIAGNOSIS — Z7984 Long term (current) use of oral hypoglycemic drugs: Secondary | ICD-10-CM | POA: Insufficient documentation

## 2023-04-18 DIAGNOSIS — Z7951 Long term (current) use of inhaled steroids: Secondary | ICD-10-CM | POA: Insufficient documentation

## 2023-04-18 DIAGNOSIS — J449 Chronic obstructive pulmonary disease, unspecified: Secondary | ICD-10-CM | POA: Insufficient documentation

## 2023-04-18 DIAGNOSIS — Z743 Need for continuous supervision: Secondary | ICD-10-CM | POA: Diagnosis not present

## 2023-04-18 DIAGNOSIS — R0602 Shortness of breath: Secondary | ICD-10-CM | POA: Diagnosis present

## 2023-04-18 DIAGNOSIS — I499 Cardiac arrhythmia, unspecified: Secondary | ICD-10-CM | POA: Diagnosis not present

## 2023-04-18 LAB — CBC WITH DIFFERENTIAL/PLATELET
Abs Immature Granulocytes: 0.01 10*3/uL (ref 0.00–0.07)
Basophils Absolute: 0 10*3/uL (ref 0.0–0.1)
Basophils Relative: 1 %
Eosinophils Absolute: 0.1 10*3/uL (ref 0.0–0.5)
Eosinophils Relative: 1 %
HCT: 30.5 % — ABNORMAL LOW (ref 36.0–46.0)
Hemoglobin: 8.7 g/dL — ABNORMAL LOW (ref 12.0–15.0)
Immature Granulocytes: 0 %
Lymphocytes Relative: 36 %
Lymphs Abs: 1.8 10*3/uL (ref 0.7–4.0)
MCH: 21.4 pg — ABNORMAL LOW (ref 26.0–34.0)
MCHC: 28.5 g/dL — ABNORMAL LOW (ref 30.0–36.0)
MCV: 75.1 fL — ABNORMAL LOW (ref 80.0–100.0)
Monocytes Absolute: 0.5 10*3/uL (ref 0.1–1.0)
Monocytes Relative: 9 %
Neutro Abs: 2.7 10*3/uL (ref 1.7–7.7)
Neutrophils Relative %: 53 %
Platelets: 281 10*3/uL (ref 150–400)
RBC: 4.06 MIL/uL (ref 3.87–5.11)
RDW: 19.5 % — ABNORMAL HIGH (ref 11.5–15.5)
WBC: 5 10*3/uL (ref 4.0–10.5)
nRBC: 0 % (ref 0.0–0.2)

## 2023-04-18 LAB — BASIC METABOLIC PANEL
Anion gap: 11 (ref 5–15)
BUN: <5 mg/dL — ABNORMAL LOW (ref 6–20)
CO2: 24 mmol/L (ref 22–32)
Calcium: 8.9 mg/dL (ref 8.9–10.3)
Chloride: 103 mmol/L (ref 98–111)
Creatinine, Ser: 0.41 mg/dL — ABNORMAL LOW (ref 0.44–1.00)
GFR, Estimated: >60 mL/min (ref >60)
Glucose, Bld: 131 mg/dL — ABNORMAL HIGH (ref 70–99)
Potassium: 4.1 mmol/L (ref 3.5–5.1)
Sodium: 138 mmol/L (ref 135–145)

## 2023-04-18 NOTE — ED Provider Notes (Signed)
[place] EMERGENCY DEPARTMENT AT [facility] Provider Note   CSN: [id number] Arrival date & time: 04/18/23  7253     History  No chief complaint on file.   [name] is a 57 y.o. female.  Presents to the emerged department for evaluation of shortness of breath.  Patient reports that she was asleep, woke up and felt short of breath.  She does report that she was on her oxygen at that time.  She comes to the ER by ambulance.  Patient reports that she is feeling better now.  She does have a history of COPD, has been using her prescribed medications and inhalers.  Patient denies cough, fever, chest pain.       Home Medications Prior to Admission medications   Medication Sig Start Date End Date Taking? Authorizing Provider  acetaminophen (TYLENOL) 325 MG tablet Take 650 mg by mouth every 6 (six) hours as needed for mild pain. Patient not taking: Reported on 03/29/2023    [provider]  albuterol (VENTOLIN HFA) 108 (90 Base) MCG/ACT inhaler Inhale 1-2 puffs into the lungs every 6 (six) hours as needed for wheezing or shortness of breath. 04/01/23   Lonell Grandchild, MD  ARIPiprazole (ABILIFY) 10 MG tablet Take 1 tablet (10 mg total) by mouth daily. Follow up with psychiatry outpatient for medication refills and adjustments Patient not taking: Reported on 03/29/2023 03/16/23 04/15/23  Zigmund Daniel., MD  budesonide-formoterol [facility]) 160-4.5 MCG/ACT inhaler Inhale 2 puffs into the lungs 2 (two) times daily. Patient not taking: Reported on 03/29/2023 02/25/23   Sabas Sous, MD  carbamazepine (TEGRETOL) 200 MG tablet 800mg  PO QD X 1D, then 600mg  PO QD X 1D, then 400mg  QD X 1D, then 200mg  PO QD X 2D Patient not taking: Reported on 03/29/2023 03/18/23   Palumbo, April, MD  cyanocobalamin (VITAMIN B12) 1000 MCG tablet Take 1 tablet (1,000 mcg total) by mouth daily. Patient not taking: Reported on 03/29/2023 03/16/23 07/24/23  Zigmund Daniel., MD   diclofenac Sodium (VOLTAREN) 1 % GEL Apply 2 g topically 4 (four) times daily. Patient not taking: Reported on 03/07/2023 02/19/23   [provider]  doxycycline (VIBRAMYCIN) 100 MG capsule Take 1 capsule (100 mg total) by mouth 2 (two) times daily. 04/16/23   Gilda Crease, MD  famotidine (PEPCID) 20 MG tablet Take 1 tablet (20 mg total) by mouth daily. Patient not taking: Reported on 03/07/2023 02/25/23   Sabas Sous, MD  ferrous sulfate 325 (65 FE) MG tablet Take 1 tablet (325 mg total) by mouth every other day. Patient not taking: Reported on 03/29/2023 03/18/23 10/02/23  Zigmund Daniel., MD  folic acid (FOLVITE) 1 MG tablet Take 1 tablet (1 mg total) by mouth daily. Patient not taking: Reported on 03/30/2023 03/17/23 05/16/23  Zigmund Daniel., MD  furosemide (LASIX) 20 MG tablet Take 1 tablet (20 mg total) by mouth daily. Patient not taking: Reported on 03/07/2023 02/24/23   Achille Rich, PA-C  gabapentin (NEURONTIN) 100 MG capsule Take 100 mg by mouth 3 (three) times daily. Patient not taking: Reported on 03/07/2023 02/19/23   [provider]  hyoscyamine (LEVSIN/SL) 0.125 MG SL tablet Place 1 tablet (0.125 mg total) under the tongue every 4 (four) hours as needed (pain). Up to 1.25 mg daily Patient not taking: Reported on 02/22/2023 01/23/23   Arthor Captain, PA-C  levETIRAcetam (KEPPRA) 500 MG tablet Take 1 tablet (500 mg total) by mouth  2 (two) times daily. 04/13/23   Palumbo, April, MD  metFORMIN (GLUCOPHAGE-XR) 750 MG 24 hr tablet Take 750 mg by mouth daily. Patient not taking: Reported on 03/29/2023 03/23/23   [provider]  nicotine (NICODERM CQ - DOSED IN MG/24 HOURS) 21 mg/24hr patch Place 1 patch (21 mg total) onto the skin daily. Patient not taking: Reported on 03/29/2023 03/16/23   Zigmund Daniel., MD  nicotine polacrilex (NICORETTE) 2 MG gum Take 1 each (2 mg total) by mouth as needed for smoking cessation. Patient not taking:  Reported on 03/29/2023 03/16/23   Zigmund Daniel., MD  ondansetron (ZOFRAN) 4 MG tablet Take 1 tablet (4 mg total) by mouth every 6 (six) hours. Patient not taking: Reported on 02/22/2023 01/20/23   Prosperi, Christian H, PA-C  ondansetron (ZOFRAN-ODT) 4 MG disintegrating tablet Take 1 tablet (4 mg total) by mouth every 8 (eight) hours as needed for nausea or vomiting. Patient not taking: Reported on 03/07/2023 03/01/23   Laurence Spates, MD  OXYGEN Inhale 4-5 L into the lungs as needed (COPD).    [provider]  pantoprazole (PROTONIX) 40 MG tablet Take 1 tablet (40 mg total) by mouth daily. Patient not taking: Reported on 03/29/2023 03/17/23 04/16/23  Zigmund Daniel., MD  predniSONE (DELTASONE) 20 MG tablet Take 2 tablets (40 mg total) by mouth daily with breakfast. 04/16/23   Nezar Buckles, Canary Brim, MD  sucralfate (CARAFATE) 1 g tablet Take 1 tablet (1 g total) by mouth 4 (four) times daily -  with meals and at bedtime. Patient not taking: Reported on 02/22/2023 02/11/23   Horton, Mayer Masker, MD  tiZANidine (ZANAFLEX) 2 MG tablet Take 2 mg by mouth at bedtime. Patient not taking: Reported on 03/07/2023 02/19/23   [provider]  zonisamide (ZONEGRAN) 100 MG capsule Take 2 capsules (200 mg total) by mouth daily. Patient not taking: Reported on 02/22/2023 01/12/23   Lewie Chamber, MD      Allergies    Iron dextran, Aspirin, and Penicillins    Review of Systems   Review of Systems  Physical Exam Updated Vital Signs BP (!) 149/78 (BP Location: Right Arm)   Pulse 100   Temp 98.3 F (36.8 C)   Resp 18   LMP 01/08/2011   SpO2 100%  Physical Exam Vitals and nursing note reviewed.  Constitutional:      General: She is not in acute distress.    Appearance: She is well-developed.  HENT:     Head: Normocephalic and atraumatic.     Mouth/Throat:     Mouth: Mucous membranes are moist.  Eyes:     General: Vision grossly intact. Gaze aligned appropriately.      Extraocular Movements: Extraocular movements intact.     Conjunctiva/sclera: Conjunctivae normal.  Cardiovascular:     Rate and Rhythm: Normal rate and regular rhythm.     Pulses: Normal pulses.     Heart sounds: Normal heart sounds, S1 normal and S2 normal. No murmur heard.    No friction rub. No gallop.  Pulmonary:     Effort: Pulmonary effort is normal. No respiratory distress.     Breath sounds: Normal breath sounds.  Abdominal:     General: Bowel sounds are normal.     Palpations: Abdomen is soft.     Tenderness: There is no abdominal tenderness. There is no guarding or rebound.     Hernia: No hernia is present.  Musculoskeletal:  General: No swelling.     Cervical back: Full passive range of motion without pain, normal range of motion and neck supple. No spinous process tenderness or muscular tenderness. Normal range of motion.     Right lower leg: No edema.     Left lower leg: No edema.  Skin:    General: Skin is warm and dry.     Capillary Refill: Capillary refill takes less than 2 seconds.     Findings: No ecchymosis, erythema, rash or wound.  Neurological:     General: No focal deficit present.     Mental Status: She is alert and oriented to person, place, and time.     GCS: GCS eye subscore is 4. GCS verbal subscore is 5. GCS motor subscore is 6.     Cranial Nerves: Cranial nerves 2-12 are intact.     Sensory: Sensation is intact.     Motor: Motor function is intact.     Coordination: Coordination is intact.  Psychiatric:        Attention and Perception: Attention normal.        Mood and Affect: Mood normal.        Speech: Speech normal.        Behavior: Behavior normal.     ED Results / Procedures / Treatments   Labs (all labs ordered are listed, but only abnormal results are displayed) Labs Reviewed  CBC WITH DIFFERENTIAL/PLATELET  BASIC METABOLIC PANEL    EKG None  Radiology DG Chest Portable 1 View  Result Date: 04/17/2023 CLINICAL DATA:   Shortness of breath EXAM: PORTABLE CHEST 1 VIEW COMPARISON:  04/16/2023 FINDINGS: Shallow inspiration. Cardiac enlargement. Probable linear atelectasis in the lung bases. No pleural effusions. No pneumothorax. No focal consolidation or airspace disease in the lungs. No change. IMPRESSION: Shallow inspiration with linear atelectasis in the lung bases. Electronically Signed   By: Burman Nieves M.D.   On: 04/17/2023 00:46    Procedures Procedures    Medications Ordered in ED Medications - No data to display  ED Course/ Medical Decision Making/ A&P                                 Medical Decision Making Amount and/or Complexity of Data Reviewed Labs: ordered.   Differential Diagnosis considered includes, but not limited to: COPD exacerbation; Bronchitis; Pneumonia; CHF; ACS; PE   Dents to the emerged part for evaluation of shortness of breath.  Patient with history of COPD.  She reports improvement at arrival.  Patient has been seen in the ED frequently with similar complaints.  She does have chronic lung disease but currently is oxygenating well, in no respiratory distress, no active wheezing.  Records review reveal a chest x-ray 24 hours ago that did not show pneumonia, does not require repeat imaging.        Final Clinical Impression(s) / ED Diagnoses Final diagnoses:  Chronic obstructive pulmonary disease, unspecified COPD type [facility])    Rx / DC Orders ED Discharge Orders     None         Dayle Mcnerney, Canary Brim, MD 04/18/23 [phone]

## 2023-04-18 NOTE — ED Triage Notes (Signed)
Patient BIB [facility] due to SOB and coughing. EMS reports clear sounding lungs. Patient wears 4L [place] baseline.  BP: 140/78 HR:96 RR:20

## 2023-04-22 ENCOUNTER — Other Ambulatory Visit: Payer: Self-pay | Admitting: *Deleted

## 2023-04-22 NOTE — Patient Instructions (Signed)
Visit Information  [name]  - as a part of your Medicaid benefit, you are eligible for care management and care coordination services at no cost or copay. I was unable to reach you by phone today but would be happy to help you with your health related needs. Please feel free to call me @ [phone].   A member of the Managed Medicaid care management team will reach out to you again over the next 7 days.   Estanislado Emms RN, BSN [place]  [facility] RN Care Coordinator [phone]

## 2023-04-22 NOTE — Patient Outreach (Signed)
Medicaid Managed Care   Unsuccessful Attempt Note   04/22/2023 Name: [name] MRN: [id number] DOB: July 26, 1965  Referred by: Fleet Contras, MD Reason for referral : High Risk Managed Medicaid (Unsuccessful RNCM initial telephone outreach)   An unsuccessful telephone outreach was attempted today. The patient was referred to the case management team for assistance with care management and care coordination.    Follow Up Plan: A HIPAA compliant phone message was left for the patient providing contact information and requesting a return call. and The Managed Medicaid care management team will reach out to the patient again over the next 7 days.    Estanislado Emms RN, BSN [place]  [facility] RN Care Coordinator [phone]

## 2023-04-28 ENCOUNTER — Telehealth: Payer: Self-pay

## 2023-04-28 ENCOUNTER — Emergency Department (HOSPITAL_COMMUNITY)
Admission: EM | Admit: 2023-04-28 | Discharge: 2023-04-29 | Disposition: A | Discharge status: Other Acute Inpatient Hospital | Payer: 59 | Attending: Emergency Medicine | Admitting: Emergency Medicine

## 2023-04-28 ENCOUNTER — Other Ambulatory Visit: Payer: Self-pay

## 2023-04-28 ENCOUNTER — Encounter (HOSPITAL_COMMUNITY): Payer: Self-pay

## 2023-04-28 DIAGNOSIS — Z79899 Other long term (current) drug therapy: Secondary | ICD-10-CM | POA: Diagnosis not present

## 2023-04-28 DIAGNOSIS — Z7951 Long term (current) use of inhaled steroids: Secondary | ICD-10-CM | POA: Diagnosis not present

## 2023-04-28 DIAGNOSIS — I480 Paroxysmal atrial fibrillation: Secondary | ICD-10-CM | POA: Diagnosis not present

## 2023-04-28 DIAGNOSIS — J449 Chronic obstructive pulmonary disease, unspecified: Secondary | ICD-10-CM | POA: Insufficient documentation

## 2023-04-28 DIAGNOSIS — R45851 Suicidal ideations: Secondary | ICD-10-CM | POA: Insufficient documentation

## 2023-04-28 DIAGNOSIS — I10 Essential (primary) hypertension: Secondary | ICD-10-CM | POA: Diagnosis not present

## 2023-04-28 DIAGNOSIS — E1165 Type 2 diabetes mellitus with hyperglycemia: Secondary | ICD-10-CM | POA: Diagnosis not present

## 2023-04-28 DIAGNOSIS — R000 Tachycardia, unspecified: Secondary | ICD-10-CM | POA: Diagnosis not present

## 2023-04-28 DIAGNOSIS — E039 Hypothyroidism, unspecified: Secondary | ICD-10-CM | POA: Diagnosis not present

## 2023-04-28 DIAGNOSIS — F329 Major depressive disorder, single episode, unspecified: Secondary | ICD-10-CM | POA: Insufficient documentation

## 2023-04-28 DIAGNOSIS — Z7984 Long term (current) use of oral hypoglycemic drugs: Secondary | ICD-10-CM | POA: Diagnosis not present

## 2023-04-28 DIAGNOSIS — F1020 Alcohol dependence, uncomplicated: Secondary | ICD-10-CM | POA: Diagnosis not present

## 2023-04-28 LAB — ACETAMINOPHEN LEVEL: Acetaminophen (Tylenol), Serum: <10 ug/mL — ABNORMAL LOW (ref 10–30)

## 2023-04-28 LAB — ETHANOL: Alcohol, Ethyl (B): <10 mg/dL (ref <10)

## 2023-04-28 LAB — SALICYLATE LEVEL: Salicylate Lvl: <7 mg/dL — ABNORMAL LOW (ref 7.0–30.0)

## 2023-04-28 LAB — RAPID URINE DRUG SCREEN, HOSP PERFORMED
Amphetamines: NOT DETECTED
Barbiturates: NOT DETECTED
Benzodiazepines: NOT DETECTED
Cocaine: NOT DETECTED
Opiates: NOT DETECTED
Tetrahydrocannabinol: NOT DETECTED

## 2023-04-28 LAB — CBC
HCT: 29.4 % — ABNORMAL LOW (ref 36.0–46.0)
Hemoglobin: 8.8 g/dL — ABNORMAL LOW (ref 12.0–15.0)
MCH: 21.4 pg — ABNORMAL LOW (ref 26.0–34.0)
MCHC: 29.9 g/dL — ABNORMAL LOW (ref 30.0–36.0)
MCV: 71.5 fL — ABNORMAL LOW (ref 80.0–100.0)
Platelets: 331 10*3/uL (ref 150–400)
RBC: 4.11 MIL/uL (ref 3.87–5.11)
RDW: 19.2 % — ABNORMAL HIGH (ref 11.5–15.5)
WBC: 6.2 10*3/uL (ref 4.0–10.5)
nRBC: 0 % (ref 0.0–0.2)

## 2023-04-28 LAB — COMPREHENSIVE METABOLIC PANEL
ALT: 19 U/L (ref 0–44)
AST: 30 U/L (ref 15–41)
Albumin: 3.6 g/dL (ref 3.5–5.0)
Alkaline Phosphatase: 90 U/L (ref 38–126)
Anion gap: 12 (ref 5–15)
BUN: <5 mg/dL — ABNORMAL LOW (ref 6–20)
CO2: 22 mmol/L (ref 22–32)
Calcium: 8.7 mg/dL — ABNORMAL LOW (ref 8.9–10.3)
Chloride: 101 mmol/L (ref 98–111)
Creatinine, Ser: 0.58 mg/dL (ref 0.44–1.00)
GFR, Estimated: >60 mL/min (ref >60)
Glucose, Bld: 177 mg/dL — ABNORMAL HIGH (ref 70–99)
Potassium: 4.1 mmol/L (ref 3.5–5.1)
Sodium: 135 mmol/L (ref 135–145)
Total Bilirubin: 0.9 mg/dL (ref <1.2)
Total Protein: 7.5 g/dL (ref 6.5–8.1)

## 2023-04-28 NOTE — ED Triage Notes (Signed)
Pt to ED by [employer] from home under IVC orders. Per [employer] and paperwork pt repeatedly takes large amounts of pills and wants to be left aloe to die. Pt has extensive history of IVC ancounters. Arrives A+O, VSS, NADN.

## 2023-04-28 NOTE — ED Notes (Signed)
Pt has been changed into burgundy scrubs.   

## 2023-04-28 NOTE — ED Provider Notes (Signed)
[place] EMERGENCY DEPARTMENT AT [facility] Provider Note   CSN: [id number] Arrival date & time: 04/28/23  2015     History  Chief Complaint  Patient presents with   IVC    [name] is a 57 y.o. female with past medical history of schizoaffective disorder alcohol abuse COPD GERD hyperlipidemia hypothyroidism diabetes presenting to the emergency room IVC.  IVC paperwork has concern over feeling increased depression down depressed hopeless.  Also patient was put reporting SI and threatening to overdose.  Patient denies SI HI at this time.  Patient reports she has not taken any medication or tried to overdose on medication however she took some aspirin earlier today for knee pain.  Patient reports daily drinking 40oz, smokes cigarettes 1 pack a day.  Denies NVD, chest pain, SOB, fever, chills, dysuria.   HPI     Home Medications Prior to Admission medications   Medication Sig Start Date End Date Taking? Authorizing Provider  acetaminophen (TYLENOL) 325 MG tablet Take 650 mg by mouth every 6 (six) hours as needed for mild pain. Patient not taking: Reported on 03/29/2023    [provider]  albuterol (VENTOLIN HFA) 108 (90 Base) MCG/ACT inhaler Inhale 1-2 puffs into the lungs every 6 (six) hours as needed for wheezing or shortness of breath. 04/01/23   Lonell Grandchild, MD  ARIPiprazole (ABILIFY) 10 MG tablet Take 1 tablet (10 mg total) by mouth daily. Follow up with psychiatry outpatient for medication refills and adjustments Patient not taking: Reported on 03/29/2023 03/16/23 04/15/23  Zigmund Daniel., MD  budesonide-formoterol [facility]) 160-4.5 MCG/ACT inhaler Inhale 2 puffs into the lungs 2 (two) times daily. Patient not taking: Reported on 03/29/2023 02/25/23   Sabas Sous, MD  carbamazepine (TEGRETOL) 200 MG tablet 800mg  PO QD X 1D, then 600mg  PO QD X 1D, then 400mg  QD X 1D, then 200mg  PO QD X 2D Patient not taking: Reported on 03/29/2023 03/18/23    Palumbo, April, MD  cyanocobalamin (VITAMIN B12) 1000 MCG tablet Take 1 tablet (1,000 mcg total) by mouth daily. Patient not taking: Reported on 03/29/2023 03/16/23 07/24/23  Zigmund Daniel., MD  diclofenac Sodium (VOLTAREN) 1 % GEL Apply 2 g topically 4 (four) times daily. Patient not taking: Reported on 03/07/2023 02/19/23   [provider]  doxycycline (VIBRAMYCIN) 100 MG capsule Take 1 capsule (100 mg total) by mouth 2 (two) times daily. 04/16/23   Gilda Crease, MD  famotidine (PEPCID) 20 MG tablet Take 1 tablet (20 mg total) by mouth daily. Patient not taking: Reported on 03/07/2023 02/25/23   Sabas Sous, MD  ferrous sulfate 325 (65 FE) MG tablet Take 1 tablet (325 mg total) by mouth every other day. Patient not taking: Reported on 03/29/2023 03/18/23 10/02/23  Zigmund Daniel., MD  folic acid (FOLVITE) 1 MG tablet Take 1 tablet (1 mg total) by mouth daily. Patient not taking: Reported on 03/30/2023 03/17/23 05/16/23  Zigmund Daniel., MD  furosemide (LASIX) 20 MG tablet Take 1 tablet (20 mg total) by mouth daily. Patient not taking: Reported on 03/07/2023 02/24/23   Achille Rich, PA-C  gabapentin (NEURONTIN) 100 MG capsule Take 100 mg by mouth 3 (three) times daily. Patient not taking: Reported on 03/07/2023 02/19/23   [provider]  hyoscyamine (LEVSIN/SL) 0.125 MG SL tablet Place 1 tablet (0.125 mg total) under the tongue every 4 (four) hours as needed (pain). Up to 1.25 mg daily Patient not  taking: Reported on 02/22/2023 01/23/23   Arthor Captain, PA-C  levETIRAcetam (KEPPRA) 500 MG tablet Take 1 tablet (500 mg total) by mouth 2 (two) times daily. 04/13/23   Palumbo, April, MD  metFORMIN (GLUCOPHAGE-XR) 750 MG 24 hr tablet Take 750 mg by mouth daily. Patient not taking: Reported on 03/29/2023 03/23/23   [provider]  nicotine (NICODERM CQ - DOSED IN MG/24 HOURS) 21 mg/24hr patch Place 1 patch (21 mg total) onto the skin daily. Patient not  taking: Reported on 03/29/2023 03/16/23   Zigmund Daniel., MD  nicotine polacrilex (NICORETTE) 2 MG gum Take 1 each (2 mg total) by mouth as needed for smoking cessation. Patient not taking: Reported on 03/29/2023 03/16/23   Zigmund Daniel., MD  ondansetron (ZOFRAN) 4 MG tablet Take 1 tablet (4 mg total) by mouth every 6 (six) hours. Patient not taking: Reported on 02/22/2023 01/20/23   Prosperi, Christian H, PA-C  ondansetron (ZOFRAN-ODT) 4 MG disintegrating tablet Take 1 tablet (4 mg total) by mouth every 8 (eight) hours as needed for nausea or vomiting. Patient not taking: Reported on 03/07/2023 03/01/23   Laurence Spates, MD  OXYGEN Inhale 4-5 L into the lungs as needed (COPD).    [provider]  pantoprazole (PROTONIX) 40 MG tablet Take 1 tablet (40 mg total) by mouth daily. Patient not taking: Reported on 03/29/2023 03/17/23 04/16/23  Zigmund Daniel., MD  predniSONE (DELTASONE) 20 MG tablet Take 2 tablets (40 mg total) by mouth daily with breakfast. 04/16/23   Pollina, Canary Brim, MD  sucralfate (CARAFATE) 1 g tablet Take 1 tablet (1 g total) by mouth 4 (four) times daily -  with meals and at bedtime. Patient not taking: Reported on 02/22/2023 02/11/23   Horton, Mayer Masker, MD  tiZANidine (ZANAFLEX) 2 MG tablet Take 2 mg by mouth at bedtime. Patient not taking: Reported on 03/07/2023 02/19/23   [provider]  zonisamide (ZONEGRAN) 100 MG capsule Take 2 capsules (200 mg total) by mouth daily. Patient not taking: Reported on 02/22/2023 01/12/23   Lewie Chamber, MD      Allergies    Iron dextran, Aspirin, and Penicillins    Review of Systems   Review of Systems  Psychiatric/Behavioral:  Positive for behavioral problems.     Physical Exam Updated Vital Signs LMP 01/08/2011  Physical Exam Vitals and nursing note reviewed.  Constitutional:      General: She is not in acute distress.    Appearance: She is not ill-appearing, toxic-appearing or diaphoretic.      Comments: Patient well-appearing, alert and oriented answering questions appropriately  HENT:     Head: Normocephalic and atraumatic.  Eyes:     General: No scleral icterus.    Conjunctiva/sclera: Conjunctivae normal.  Cardiovascular:     Rate and Rhythm: Normal rate and regular rhythm.     Pulses: Normal pulses.     Heart sounds: Normal heart sounds.  Pulmonary:     Effort: Pulmonary effort is normal. No respiratory distress.     Breath sounds: Normal breath sounds.  Abdominal:     General: Abdomen is flat. Bowel sounds are normal.     Palpations: Abdomen is soft.     Tenderness: There is no abdominal tenderness.  Musculoskeletal:     Right lower leg: No edema.     Left lower leg: No edema.  Skin:    General: Skin is warm and dry.     Findings: No lesion.  Neurological:     General: No focal deficit present.     Mental Status: She is alert and oriented to person, place, and time. Mental status is at baseline.     Cranial Nerves: No cranial nerve deficit.     Motor: No weakness.     Gait: Gait normal.     ED Results / Procedures / Treatments   Labs (all labs ordered are listed, but only abnormal results are displayed) Labs Reviewed  RAPID URINE DRUG SCREEN, HOSP PERFORMED  COMPREHENSIVE METABOLIC PANEL  ETHANOL  SALICYLATE LEVEL  ACETAMINOPHEN LEVEL  CBC    EKG None  Radiology No results found.  Procedures Procedures    Medications Ordered in ED Medications - No data to display  ED Course/ Medical Decision Making/ A&P                                 Medical Decision Making Amount and/or Complexity of Data Reviewed Labs: ordered.   This patient presents to the ED for concern of IVC, this involves an extensive number of treatment options, and is a complaint that carries with it a high risk of complications and morbidity.  The differential diagnosis includes SI, HI,    Co morbidities that complicate the patient evaluation  schizoaffective  disorder alcohol abuse COPD GERD hyperlipidemia hypothyroidism diabetes   Additional history obtained:  None    Lab Tests:  I personally interpreted labs.  The pertinent results include:   CBC shows no leukocytosis, hemoglobin 8.8 which is same as prior recent lab Acetaminophen under 10, salicylate under 7, EtOH under 10 CMP shows no obvious electrolyte abnormality glucose is 177 Urine drug screen shows none detected    Imaging Studies ordered:  None    Cardiac Monitoring: / EKG:  The patient was maintained on a cardiac monitor.  I personally viewed and interpreted the cardiac monitored which showed an underlying rhythm of: Sinus    Consultations Obtained:  I requested consultation with the TTS,  and discussed lab and imaging findings as well as pertinent plan - they recommend: PENDING    Problem List / ED Course / Critical interventions / Medication management  Patient reporting to emergency room after she was IVC by family member.  IVC paperwork reports she was threatening to take medications and she wanted to  be left alone to die. Patient threatened to take pills, but reports she did not take anything. Patient has past medical history of schizoaffective disorder and substance abuse.  No active signs of withdrawal at this time patient not tachycardic and not nauseous. Patient is medically cleared to be seen for TTS, patient is IVC Reevaluation of the patient after these medicines showed that the patient improved I have reviewed the patients home medicines and have made adjustments as needed   Plan IVC for SI  [facility] to see patient           Final Clinical Impression(s) / ED Diagnoses Final diagnoses:  None    Rx / DC Orders ED Discharge Orders     None         Chamia Schmutz, Horald Chestnut, PA-C 04/28/23 2212    Royanne Foots, DO 05/05/23 1148

## 2023-04-28 NOTE — ED Notes (Addendum)
Pt has 2 personal belonging bags. Located in cabinet labelled 9-12 HALL [address]

## 2023-04-28 NOTE — Telephone Encounter (Signed)
..   Medicaid Managed Care   Unsuccessful Outreach Note  04/28/2023 Name: [name] MRN: [id number] DOB: [birth year]  Referred by: Fleet Contras, MD Reason for referral : Appointment   A second unsuccessful telephone outreach was attempted today. The patient was referred to the case management team for assistance with care management and care coordination.   Follow Up Plan: The care management team will reach out to the patient again over the next 7 days.   Weston Settle Care Guide  [facility] Managed  Care Guide [facility]  [phone]

## 2023-04-29 ENCOUNTER — Inpatient Hospital Stay
Admission: AD | Admit: 2023-04-29 | Discharge: 2023-05-01 | DRG: 885 | Disposition: A | Discharge status: Home / Self Care | Payer: 59 | Source: Intra-hospital | Attending: Psychiatry | Admitting: Psychiatry

## 2023-04-29 ENCOUNTER — Encounter: Payer: Self-pay | Admitting: Psychiatry

## 2023-04-29 DIAGNOSIS — F259 Schizoaffective disorder, unspecified: Principal | ICD-10-CM | POA: Diagnosis present

## 2023-04-29 DIAGNOSIS — R451 Restlessness and agitation: Secondary | ICD-10-CM | POA: Diagnosis present

## 2023-04-29 DIAGNOSIS — K219 Gastro-esophageal reflux disease without esophagitis: Secondary | ICD-10-CM | POA: Diagnosis present

## 2023-04-29 DIAGNOSIS — Z9889 Other specified postprocedural states: Secondary | ICD-10-CM

## 2023-04-29 DIAGNOSIS — F1410 Cocaine abuse, uncomplicated: Secondary | ICD-10-CM | POA: Diagnosis present

## 2023-04-29 DIAGNOSIS — M549 Dorsalgia, unspecified: Secondary | ICD-10-CM | POA: Diagnosis present

## 2023-04-29 DIAGNOSIS — E1140 Type 2 diabetes mellitus with diabetic neuropathy, unspecified: Secondary | ICD-10-CM | POA: Diagnosis present

## 2023-04-29 DIAGNOSIS — Z635 Disruption of family by separation and divorce: Secondary | ICD-10-CM | POA: Diagnosis not present

## 2023-04-29 DIAGNOSIS — E785 Hyperlipidemia, unspecified: Secondary | ICD-10-CM | POA: Diagnosis present

## 2023-04-29 DIAGNOSIS — Z7951 Long term (current) use of inhaled steroids: Secondary | ICD-10-CM | POA: Diagnosis not present

## 2023-04-29 DIAGNOSIS — I10 Essential (primary) hypertension: Secondary | ICD-10-CM | POA: Diagnosis present

## 2023-04-29 DIAGNOSIS — Z96641 Presence of right artificial hip joint: Secondary | ICD-10-CM | POA: Diagnosis present

## 2023-04-29 DIAGNOSIS — I4891 Unspecified atrial fibrillation: Secondary | ICD-10-CM | POA: Diagnosis present

## 2023-04-29 DIAGNOSIS — F17210 Nicotine dependence, cigarettes, uncomplicated: Secondary | ICD-10-CM | POA: Diagnosis present

## 2023-04-29 DIAGNOSIS — F10939 Alcohol use, unspecified with withdrawal, unspecified: Secondary | ICD-10-CM | POA: Diagnosis present

## 2023-04-29 DIAGNOSIS — Z7984 Long term (current) use of oral hypoglycemic drugs: Secondary | ICD-10-CM

## 2023-04-29 DIAGNOSIS — G8929 Other chronic pain: Secondary | ICD-10-CM | POA: Diagnosis present

## 2023-04-29 DIAGNOSIS — Z90710 Acquired absence of both cervix and uterus: Secondary | ICD-10-CM

## 2023-04-29 DIAGNOSIS — Z9151 Personal history of suicidal behavior: Secondary | ICD-10-CM

## 2023-04-29 DIAGNOSIS — Z79899 Other long term (current) drug therapy: Secondary | ICD-10-CM

## 2023-04-29 DIAGNOSIS — F250 Schizoaffective disorder, bipolar type: Principal | ICD-10-CM | POA: Diagnosis present

## 2023-04-29 DIAGNOSIS — Z9884 Bariatric surgery status: Secondary | ICD-10-CM

## 2023-04-29 DIAGNOSIS — Z716 Tobacco abuse counseling: Secondary | ICD-10-CM

## 2023-04-29 DIAGNOSIS — E039 Hypothyroidism, unspecified: Secondary | ICD-10-CM | POA: Diagnosis not present

## 2023-04-29 DIAGNOSIS — Z8249 Family history of ischemic heart disease and other diseases of the circulatory system: Secondary | ICD-10-CM

## 2023-04-29 DIAGNOSIS — Z888 Allergy status to other drugs, medicaments and biological substances status: Secondary | ICD-10-CM

## 2023-04-29 DIAGNOSIS — Z886 Allergy status to analgesic agent status: Secondary | ICD-10-CM

## 2023-04-29 DIAGNOSIS — Z825 Family history of asthma and other chronic lower respiratory diseases: Secondary | ICD-10-CM | POA: Diagnosis not present

## 2023-04-29 DIAGNOSIS — I480 Paroxysmal atrial fibrillation: Secondary | ICD-10-CM | POA: Diagnosis not present

## 2023-04-29 DIAGNOSIS — F419 Anxiety disorder, unspecified: Secondary | ICD-10-CM | POA: Diagnosis present

## 2023-04-29 DIAGNOSIS — Z91148 Patient's other noncompliance with medication regimen for other reason: Secondary | ICD-10-CM

## 2023-04-29 DIAGNOSIS — M25569 Pain in unspecified knee: Secondary | ICD-10-CM | POA: Diagnosis present

## 2023-04-29 DIAGNOSIS — G43909 Migraine, unspecified, not intractable, without status migrainosus: Secondary | ICD-10-CM | POA: Diagnosis present

## 2023-04-29 DIAGNOSIS — Z833 Family history of diabetes mellitus: Secondary | ICD-10-CM | POA: Diagnosis not present

## 2023-04-29 DIAGNOSIS — J449 Chronic obstructive pulmonary disease, unspecified: Secondary | ICD-10-CM | POA: Diagnosis not present

## 2023-04-29 DIAGNOSIS — Z7952 Long term (current) use of systemic steroids: Secondary | ICD-10-CM

## 2023-04-29 DIAGNOSIS — Z880 Allergy status to penicillin: Secondary | ICD-10-CM

## 2023-04-29 DIAGNOSIS — F1010 Alcohol abuse, uncomplicated: Secondary | ICD-10-CM | POA: Diagnosis present

## 2023-04-29 DIAGNOSIS — F329 Major depressive disorder, single episode, unspecified: Principal | ICD-10-CM | POA: Insufficient documentation

## 2023-04-29 DIAGNOSIS — E1165 Type 2 diabetes mellitus with hyperglycemia: Secondary | ICD-10-CM | POA: Diagnosis not present

## 2023-04-29 MED ORDER — HYOSCYAMINE SULFATE 0.125 MG PO TBDP
0.2500 mg | ORAL_TABLET | Freq: Four times a day (QID) | ORAL | Status: DC | PRN
  Filled 2023-04-29: qty 2

## 2023-04-29 MED ORDER — MAGNESIUM HYDROXIDE 400 MG/5ML PO SUSP: 30.0000 mL | Freq: Every day | ORAL | Status: DC | PRN

## 2023-04-29 MED ORDER — ALBUTEROL SULFATE HFA 108 (90 BASE) MCG/ACT IN AERS
1.0000 | INHALATION_SPRAY | Freq: Four times a day (QID) | RESPIRATORY_TRACT | Status: DC | PRN
Start: 2023-04-29 — End: 2023-05-01
  Administered 2023-04-29 – 2023-05-01 (×2): 2 via RESPIRATORY_TRACT

## 2023-04-29 MED ORDER — ALUM & MAG HYDROXIDE-SIMETH 200-200-20 MG/5ML PO SUSP
30.0000 mL | ORAL | Status: DC | PRN
  Administered 2023-04-29: 30 mL via ORAL
  Filled 2023-04-29: qty 30

## 2023-04-29 MED ORDER — ACETAMINOPHEN 325 MG PO TABS
650.0000 mg | ORAL_TABLET | Freq: Four times a day (QID) | ORAL | Status: DC | PRN
  Administered 2023-04-29 – 2023-04-30 (×2): 650 mg via ORAL
  Filled 2023-04-29 (×2): qty 2

## 2023-04-29 MED ORDER — FUROSEMIDE 20 MG PO TABS: 20.0000 mg | ORAL_TABLET | Freq: Every day | ORAL | Status: DC

## 2023-04-29 MED ORDER — FAMOTIDINE 20 MG PO TABS
20.0000 mg | ORAL_TABLET | Freq: Every day | ORAL | Status: DC
  Administered 2023-04-29: 20 mg via ORAL
  Filled 2023-04-29: qty 1

## 2023-04-29 MED ORDER — TIZANIDINE HCL 2 MG PO TABS: 2.0000 mg | ORAL_TABLET | Freq: Four times a day (QID) | ORAL | Status: DC | PRN

## 2023-04-29 MED ORDER — ARIPIPRAZOLE 5 MG PO TABS
10.0000 mg | ORAL_TABLET | Freq: Every day | ORAL | Status: DC
  Administered 2023-04-30: 10 mg via ORAL
  Filled 2023-04-29 (×2): qty 2

## 2023-04-29 MED ORDER — ALBUTEROL SULFATE HFA 108 (90 BASE) MCG/ACT IN AERS
1.0000 | INHALATION_SPRAY | Freq: Four times a day (QID) | RESPIRATORY_TRACT | Status: DC
  Administered 2023-04-29: 1 via RESPIRATORY_TRACT
  Filled 2023-04-29: qty 6.7

## 2023-04-29 MED ORDER — FLUTICASONE FUROATE-VILANTEROL 200-25 MCG/ACT IN AEPB: 1.0000 | INHALATION_SPRAY | Freq: Every day | RESPIRATORY_TRACT | Status: DC

## 2023-04-29 MED ORDER — ARIPIPRAZOLE 10 MG PO TABS
10.0000 mg | ORAL_TABLET | Freq: Every day | ORAL | Status: DC
  Administered 2023-04-29: 10 mg via ORAL
  Filled 2023-04-29: qty 1

## 2023-04-29 MED ORDER — DIPHENHYDRAMINE HCL 50 MG/ML IJ SOLN
50.0000 mg | Freq: Three times a day (TID) | INTRAMUSCULAR | Status: DC | PRN
  Filled 2023-04-29: qty 1

## 2023-04-29 MED ORDER — FOLIC ACID 1 MG PO TABS
1.0000 mg | ORAL_TABLET | Freq: Every day | ORAL | Status: DC
  Administered 2023-04-29: 1 mg via ORAL
  Filled 2023-04-29: qty 1

## 2023-04-29 MED ORDER — PANTOPRAZOLE SODIUM 40 MG PO TBEC
40.0000 mg | DELAYED_RELEASE_TABLET | Freq: Every day | ORAL | Status: DC
  Administered 2023-04-29 – 2023-04-30 (×2): 40 mg via ORAL
  Filled 2023-04-29 (×3): qty 1

## 2023-04-29 MED ORDER — ZONISAMIDE 100 MG PO CAPS
200.0000 mg | ORAL_CAPSULE | Freq: Every day | ORAL | Status: DC
  Administered 2023-04-29 – 2023-04-30 (×2): 200 mg via ORAL
  Filled 2023-04-29 (×3): qty 2

## 2023-04-29 MED ORDER — IBUPROFEN 200 MG PO TABS
400.0000 mg | ORAL_TABLET | Freq: Four times a day (QID) | ORAL | Status: DC | PRN
  Administered 2023-04-29: 400 mg via ORAL
  Filled 2023-04-29: qty 2

## 2023-04-29 MED ORDER — TIZANIDINE HCL 4 MG PO TABS: 2.0000 mg | ORAL_TABLET | Freq: Every day | ORAL | Status: DC

## 2023-04-29 MED ORDER — LEVETIRACETAM 500 MG PO TABS: 500.0000 mg | ORAL_TABLET | Freq: Two times a day (BID) | ORAL | Status: DC

## 2023-04-29 MED ORDER — ACETAMINOPHEN 325 MG PO TABS: 650.0000 mg | ORAL_TABLET | Freq: Four times a day (QID) | ORAL | Status: DC | PRN

## 2023-04-29 MED ORDER — ZONISAMIDE 100 MG PO CAPS: 200.0000 mg | ORAL_CAPSULE | Freq: Every day | ORAL | Status: DC

## 2023-04-29 MED ORDER — SUCRALFATE 1 G PO TABS
1.0000 g | ORAL_TABLET | Freq: Three times a day (TID) | ORAL | Status: DC
  Administered 2023-04-29 – 2023-05-01 (×7): 1 g via ORAL
  Filled 2023-04-29 (×8): qty 1

## 2023-04-29 MED ORDER — NICOTINE 14 MG/24HR TD PT24
14.0000 mg | MEDICATED_PATCH | Freq: Every day | TRANSDERMAL | Status: DC
  Administered 2023-04-29: 14 mg via TRANSDERMAL
  Filled 2023-04-29: qty 1

## 2023-04-29 MED ORDER — DIPHENHYDRAMINE HCL 25 MG PO CAPS: 50.0000 mg | ORAL_CAPSULE | Freq: Three times a day (TID) | ORAL | Status: DC | PRN

## 2023-04-29 MED ORDER — METFORMIN HCL ER 750 MG PO TB24: 750.0000 mg | ORAL_TABLET | Freq: Every day | ORAL | Status: DC

## 2023-04-29 MED ORDER — ALBUTEROL SULFATE HFA 108 (90 BASE) MCG/ACT IN AERS: 1.0000 | INHALATION_SPRAY | Freq: Four times a day (QID) | RESPIRATORY_TRACT | Status: DC | PRN

## 2023-04-29 MED ORDER — GABAPENTIN 100 MG PO CAPS
100.0000 mg | ORAL_CAPSULE | Freq: Three times a day (TID) | ORAL | Status: DC
  Administered 2023-04-29 – 2023-04-30 (×3): 100 mg via ORAL
  Filled 2023-04-29 (×3): qty 1

## 2023-04-29 MED ORDER — NICOTINE 14 MG/24HR TD PT24
14.0000 mg | MEDICATED_PATCH | Freq: Every day | TRANSDERMAL | Status: DC
  Administered 2023-04-30 – 2023-05-01 (×2): 14 mg via TRANSDERMAL
  Filled 2023-04-29 (×3): qty 1

## 2023-04-29 MED ORDER — GABAPENTIN 100 MG PO CAPS
100.0000 mg | ORAL_CAPSULE | Freq: Three times a day (TID) | ORAL | Status: DC
  Administered 2023-04-29: 100 mg via ORAL
  Filled 2023-04-29: qty 1

## 2023-04-29 MED ORDER — VITAMIN B-12 1000 MCG PO TABS: 1000.0000 ug | ORAL_TABLET | Freq: Every day | ORAL | Status: DC

## 2023-04-29 MED ORDER — LEVETIRACETAM ER 500 MG PO TB24
500.0000 mg | ORAL_TABLET | Freq: Two times a day (BID) | ORAL | Status: DC
  Administered 2023-04-29 – 2023-04-30 (×4): 500 mg via ORAL
  Filled 2023-04-29 (×6): qty 1

## 2023-04-29 MED ORDER — LORAZEPAM 1 MG PO TABS: 2.0000 mg | ORAL_TABLET | Freq: Three times a day (TID) | ORAL | Status: DC | PRN

## 2023-04-29 MED ORDER — LORAZEPAM 2 MG/ML IJ SOLN
2.0000 mg | Freq: Three times a day (TID) | INTRAMUSCULAR | Status: DC | PRN
  Filled 2023-04-29: qty 1

## 2023-04-29 NOTE — ED Notes (Signed)
Pt received breakfast tray 

## 2023-04-29 NOTE — Group Note (Signed)
Recreation Therapy Group Note   Group Topic:Goal Setting  Group Date: 04/29/2023 Start Time: 1400 End Time: 1450 Facilitators: Rosina Lowenstein, LRT, CTRS Location:  Craft Room  Group Description: Product/process development scientist. Patients were given many different magazines, a glue stick, markers, and a piece of cardstock paper. LRT and pts discussed the importance of having goals in life. LRT and pts discussed the difference between short-term and long-term goals, as well as what a SMART goal is. LRT encouraged pts to create a vision board, with images they picked and then cut out with safety scissors from the magazine, for themselves, that capture their short and long-term goals. LRT encouraged pts to show and explain their vision board to the group.   Goal Area(s) Addressed:  Patient will gain knowledge of short vs. long term goals.  Patient will identify goals for themselves. Patient will practice setting SMART goals. Patient will verbalize their goals to LRT and peers.   Affect/Mood: Appropriate   Participation Level: Active and Engaged   Participation Quality: Independent   Behavior: Calm and Cooperative   Speech/Thought Process: Coherent   Insight: Good   Judgement: Fair    Modes of Intervention: Art   Patient Response to Interventions:  Attentive, Engaged, Interested , and Receptive   Education Outcome:  Acknowledges education   Clinical Observations/Individualized Feedback: [name] was active in their participation of session activities and group discussion. Pt identified "I would like to have some diamonds. I want to get cameras for my house to see what's going on" as her goals. Of note, pt was in and out of group multiple times for multiple different things. Pt interacted well with LRT and peers duration of session.    Plan: Continue to engage patient in RT group sessions 2-3x/week.   Rosina Lowenstein, LRT, CTRS 04/29/2023 2:55 PM

## 2023-04-29 NOTE — ED Notes (Signed)
TTS is progress 

## 2023-04-29 NOTE — BH Assessment (Addendum)
Comprehensive Clinical Assessment (CCA) Note  04/29/2023 [name] [id number]  Disposition: Sindy Guadeloupe, NP recommends inpatient treatment. AC to review if no available beds, CSW to seek placement. Disposition discussed with [name]. [name], Charity fundraiser.   The patient demonstrates the following risk factors for suicide: Chronic risk factors for suicide include: psychiatric disorder of Major Depressive Disorder, substance use disorder, and previous suicide attempts Per daughter pt has multiple suicide attempts . Acute risk factors for suicide include:  Per IVC pt attempted suicide . Protective factors for this patient include: positive social support. Considering these factors, the overall suicide risk at this point appears to be high. Patient is not appropriate for outpatient follow up.  [name] is a 56 year old female who presents involuntary and unaccompanied to [facility]. Clinician asked the pt, "what brought you to the hospital?" Pt reports, she told her daughter some that didn't happen. Per pt, her daughter said she tried to hurt herself but she didn't,"I love me."  Pt reports, yesterday she said if she was to take a bottle of pills  and something happened to her; she asked her daughter would she care. Pt reports, her daughter asked her is that what she wanted to do, she said "no," it was an example, would she care. Pt reports, she has a home health aide that comes in and cooks breakfast for her every morning (two hours daily.) Pt denies, SI, HI, AVH, self-injurious behaviors and access to weapons.   Pt was IVC'd by her daughter [name], [phone]). Per IVC paperwork: "Respondent has been committed multiple times in the past. Respondent has stated that she wants to be left alone to die. Respondent has ingested an unknown amount of pills and is trying to take more. Without intervention could continue to harm herself."   Pt reports, drinking 40.5 oz of beer and smoking a pack and a half  of cigarettes daily. Pt's UDS is negative. Pt denies being linked to OPT resources (medication management and/or counseling.)   Pt presents quiet, awake in scrubs with normal speech and eye contact. Pt's mood was euthymic. Pt's affect was congruent. Pt's insight and judgment are poor. Pt reports, if discharged she can contract for safety.  *Clinician spoke to the pt's daughter to gather additional information. Pt's daughter reports, for the past 2-3 days the pt has had increased agitation if she doesn't get her way or what she wants. Per daughter, her step dad (mother's husband) is in town and travels back and forth. Pt's daughter reports, the pt has delusions that her husband is cheating on her. Per daughter earlier today the pt was not herself, agitated; her step dad called her because the pt took a bunch of pills. Pt's daughter reports, she tried talking to her but she was focused on being cheated on. Pt's daughter reports, she heard the pt say, "I'll take the rest of the pills nobody loves me anyway." Pt's daughter reports, she took antacid and Gabapentin. Per daughter, the pt doesn't take care of herself. Pt's daughter reports, in 12/2021 the pt had a serious suicide attempt were she overdosed. Pt's daughter reports, pt has previous inpatient admissions.*   Chief Complaint:  Chief Complaint  Patient presents with   IVC   Visit Diagnosis:  Major Depressive Disorder.                              Alcohol use disorder, severe, dependence (  HCC).  CCA Screening, Triage and Referral (STR)  Patient Reported Information How did you hear about [place]? Legal System  What Is the Reason for Your Visit/Call Today? Pt was IVC'd by her daughter for trying to take more medications as a suicide attempt. Pt denies, content of IVC and reports she can contract for safety. Pt also denies, HI, AVH, self-injurious behaviors and access to weapons.  How Long Has This Been Causing You Problems? <Week  What Do You Feel  Would Help You the Most Today? Medication(s); Treatment for Depression or other mood problem   Have You Recently Had Any Thoughts About Hurting Yourself? Yes  Are You Planning to Commit Suicide/Harm Yourself At This time? Yes   Flowsheet Row ED from 04/28/2023 in [facility] Emergency Department at [facility] ED from 04/18/2023 in [facility] Emergency Department at [facility] ED from 04/16/2023 in [facility] Emergency Department at [facility]  C-SSRS RISK CATEGORY High Risk No Risk No Risk       Have you Recently Had Thoughts About Hurting Someone [name]? No  Are You Planning to Harm Someone at This Time? No  Explanation: Pt denies, HI.   Have You Used Any Alcohol or Drugs in the Past 24 Hours? Yes  What Did You Use and How Much? Pt reports, drinking 40.5 oz of beer and smoking a pack and a half of cigarettes daily.   Do You Currently Have a Therapist/Psychiatrist? No  Name of Therapist/Psychiatrist: Name of Therapist/Psychiatrist: None.   Have You Been Recently Discharged From Any Office Practice or Programs? No. Explanation of Discharge From Practice/Program: None.    CCA Screening Triage Referral Assessment Type of Contact: Tele-Assessment  Telemedicine Service Delivery: Telemedicine service delivery: This service was provided via telemedicine using a 2-way, interactive audio and video technology  Is this Initial or Reassessment? Is this Initial or Reassessment?: Initial Assessment  Date Telepsych consult ordered in [facility]:  Date Telepsych consult ordered in [facility]: 04/28/23  Time Telepsych consult ordered in [facility]:  Time Telepsych consult ordered in [facility]: 2057  Location of Assessment: [facility] ED  Provider Location: [facility] Assessment Services   Collateral Involvement: [name], daughter/IVC petitioner, [phone].   Does Patient Have a Automotive engineer Guardian? No  Legal Guardian Contact Information: Pt is her own guardian.  Copy  of Legal Guardianship Form: -- (Pt is her own guardian.)  Legal Guardian Notified of Arrival: -- (Pt is her own guardian.)  Legal Guardian Notified of Pending Discharge: -- (Pt is her own guardian.)  If Minor and Not Living with Parent(s), Who has Custody? Pt is an adult.  Is CPS involved or ever been involved? Never  Is APS involved or ever been involved? Never   Patient Determined To Be At Risk for Harm To Self or Others Based on Review of Patient Reported Information or Presenting Complaint? Yes, for Self-Harm (Per IVC however pt denies.)  Method: Plan with intent and identified person (Per IVC however pt denies.)  Availability of Means: In hand or used (Per IVC however pt denies.)  Intent: Clearly intends on inflicting harm that could cause death (Per IVC however pt denies.)  Notification Required: No need or identified person  Additional Information for Danger to Others Potential: -- (Pt denies, HI.)  Additional Comments for Danger to Others Potential: Pt denies, HI.  Are There Guns or Other Weapons in Your Home? No  Types of Guns/Weapons: Pt denies, access to weapons.  Are These Weapons  Safely Secured?                            -- (Pt denies, access to weapons.)  Who Could Verify You Are Able To Have These Secured: Pt denies, access to weapons.  Do You Have any Outstanding Charges, Pending Court Dates, Parole/Probation? Pt denies, legal involvement.  Contacted To Inform of Risk of Harm To Self or Others: Other: Comment (None.)    Does Patient Present under Involuntary Commitment? Yes    [place] of Residence: [place]   Patient Currently Receiving the Following Services: Not Receiving Services   Determination of Need: Emergent (2 hours)   Options For Referral: Inpatient Hospitalization; Medication Management; Outpatient Therapy     CCA Biopsychosocial Patient Reported Schizophrenia/Schizoaffective Diagnosis in Past: No   Strengths: Pt denies having  stressors.   Mental Health Symptoms Depression:   None (Pt denies.)   Duration of Depressive symptoms:    Mania:   None (Pt denies.)   Anxiety:    -- (Anxiety.)   Psychosis:   None (Pt denies.)   Duration of Psychotic symptoms:    Trauma:   None (Pt denies.)   Obsessions:   None (Pt denies.)   Compulsions:   None (Pt denies.)   Inattention:   None (Pt denies.)   Hyperactivity/Impulsivity:   Fidgets with hands/feet   Oppositional/Defiant Behaviors:   None (Pt denies.)   Emotional Irregularity:   None (Pt denies.)   Other Mood/Personality Symptoms:   None.    Mental Status Exam Appearance and self-care  Stature:  Average   Weight:   Average weight   Clothing:   -- (Pt in scrubs.)   Grooming:   Normal   Cosmetic use:   None   Posture/gait:   Normal   Motor activity:   Not Remarkable   Sensorium  Attention:   Normal   Concentration:   Normal   Orientation:   X5   Recall/memory:   Normal   Affect and Mood  Affect:  Congruent   Mood:  Euthymic   Relating  Eye contact:  Normal   Facial expression:  Responsive   Attitude toward examiner:   Cooperative   Thought and Language  Speech flow:  Normal   Thought content:   Appropriate to Mood and Circumstances   Preoccupation:   None   Hallucinations:   None   Organization:   Coherent   [employer] of Knowledge:   Fair   Intelligence:   Average   Abstraction:   Functional   Judgement:   Poor   Reality Testing:   Realistic   Insight:   Fair   Decision Making:   Impulsive   Social Functioning  Social Maturity:  Impulsive   Social Judgement:   Heedless   Stress  Stressors:   Other (Comment) (Pt denies, stressors.)   Coping Ability:   Overwhelmed   Skill Deficits:   Responsibility; Decision making   Supports:   Family     Religion: Religion/Spirituality Are You A Religious Person?: Yes What is Your Religious  Affiliation?: Baptist How Might This Affect Treatment?: None.  Leisure/Recreation: Leisure / Recreation Do You Have Hobbies?: No  Exercise/Diet: Exercise/Diet Do You Exercise?: No Have You Gained or Lost A Significant Amount of Weight in the Past Six Months?: No Do You Follow a Special Diet?: No Do You Have Any Trouble Sleeping?: No   CCA Employment/Education Employment/Work Situation: Employment /  Work Situation Employment Situation: On disability Why is Patient on Disability: Pt reports, "that's a good question." How Long has Patient Been on Disability: Pt reports, for a couple of years." Patient's Job has Been Impacted by Current Illness: No Has Patient ever Been in the [employer]?: No  Education: Education Is Patient Currently Attending School?: No Last Grade Completed: 10 Did You Attend College?: No Did You Have An Individualized Education Program (IIEP): No Did You Have Any Difficulty At School?: No Patient's Education Has Been Impacted by Current Illness: No   CCA Family/Childhood History Family and Relationship History: Family history Marital status: Single Does patient have children?: Yes How many children?: 3 How is patient's relationship with their children?: Pt reports, she has three daughters.  Childhood History:  Childhood History By whom was/is the patient raised?: Both parents Did patient suffer any verbal/emotional/physical/sexual abuse as a child?: No (Pt denies.) Did patient suffer from severe childhood neglect?: No (Pt denies.) Has patient ever been sexually abused/assaulted/raped as an adolescent or adult?: No (Pt denies.) Was the patient ever a victim of a crime or a disaster?: No Witnessed domestic violence?: No (Pt denies.) Has patient been affected by domestic violence as an adult?: No       CCA Substance Use Alcohol/Drug Use: Alcohol / Drug Use Pain Medications: See MAR Prescriptions: See MAR Over the Counter: See MAR History of  alcohol / drug use?: Yes Longest period of sobriety (when/how long): None. Negative Consequences of Use: Personal relationships Withdrawal Symptoms: None    ASAM's:  Six Dimensions of Multidimensional Assessment  Dimension 1:  Acute Intoxication and/or Withdrawal Potential:      Dimension 2:  Biomedical Conditions and Complications:      Dimension 3:  Emotional, Behavioral, or Cognitive Conditions and Complications:     Dimension 4:  Readiness to Change:     Dimension 5:  Relapse, Continued use, or Continued Problem Potential:     Dimension 6:  Recovery/Living Environment:     ASAM Severity Score:    ASAM Recommended Level of Treatment:     Substance use Disorder (SUD)    Recommendations for Services/Supports/Treatments: Recommendations for Services/Supports/Treatments Recommendations For Services/Supports/Treatments: Inpatient Hospitalization  Discharge Disposition: Discharge Disposition Medical Exam completed: Yes  DSM5 Diagnoses: Patient Active Problem List   Diagnosis Date Noted   Acute on chronic anemia 03/13/2023   B12 deficiency 03/13/2023   Hyponatremia 02/22/2023   Chronic alcohol use 02/22/2023   Continuous dependence on cigarette smoking 02/22/2023   Alcohol use disorder, severe, dependence (HCC) 01/03/2023   Suicidal ideation 01/03/2023   Abnormal weight loss 01/02/2023   Nausea and vomiting 01/02/2023   Hyperglycemia due to type 2 diabetes mellitus (HCC) 01/02/2023   History of colonic polyps 01/02/2023   Acute on chronic respiratory failure with hypoxia (HCC) 12/11/2022   Pulmonary nodules 12/11/2022   Type 2 diabetes mellitus without complications (HCC) 12/11/2022   Malnutrition of moderate degree 11/22/2022   Withdrawal symptoms, alcohol (HCC) 11/20/2022   CAP (community acquired pneumonia) 11/20/2022   Closed fracture of right zygomaticomaxillary complex (HCC) 08/26/2022   Blunt trauma 08/26/2022   Alcohol withdrawal (HCC) 08/25/2022    Seizure-like activity (HCC) 08/23/2022   Cocaine abuse (HCC) 08/23/2022   Closed fracture of left proximal humerus 07/08/2022   Homicidal ideation 07/07/2022   Aggressive behavior 07/05/2022   Hyperbilirubinemia 06/16/2022   Pressure injury of skin 06/16/2022   Pulmonary embolism (HCC) 05/19/2022   Postoperative wound infection 05/04/2022   Thrombocytosis 05/04/2022   Seizure (  HCC) 04/17/2022   Memory loss 04/17/2022   SOB (shortness of breath) 04/05/2022   Abnormal EEG 02/12/2022   Constipation 02/12/2022   Vitamin A deficiency 02/12/2022   Vitamin D deficiency 02/12/2022   Vitamin C deficiency 02/12/2022   Iron deficiency anemia 02/12/2022   GERD (gastroesophageal reflux disease) 02/12/2022   Tobacco abuse 02/12/2022   Protein-calorie malnutrition, severe 01/14/2022   Acute respiratory failure with hypoxia and hypercapnia (HCC) 11/14/2021   Anemia 11/14/2021   Alcohol abuse with intoxication (HCC) 11/12/2021   Dysphagia 05/28/2021   Nasal septal perforation 05/28/2021   Referred otalgia of left ear 05/28/2021   Oral pain 05/28/2021   Encephalopathy 10/10/2020   Polysubstance (excluding opioids) dependence, daily use (HCC) 08/04/2018   Substance induced mood disorder (HCC) 08/03/2018   Schizoaffective disorder, bipolar type (HCC) 11/13/2017   Polysubstance abuse (HCC) 11/13/2017   Type 2 diabetes mellitus (HCC) 09/06/2015   Right knee pain 01/05/2015   Muscular deconditioning 09/08/2014   Primary insomnia 09/08/2014   Encounter for monitoring opioid maintenance therapy 07/10/2014   Myofascial pain syndrome 12/09/2013   DDD (degenerative disc disease), lumbosacral 08/12/2013   Low back pain 08/12/2013   Lumbosacral spondylosis 04/13/2013   Primary localized osteoarthrosis, lower leg 12/14/2012   Chronic pain 11/27/2012   Hypothyroidism 11/27/2012   Sacroiliitis (HCC) 11/27/2012   Pain in joint involving lower leg 11/27/2012   Essential hypertension 11/04/2011    Paroxysmal atrial fibrillation with RVR (HCC) 09/30/2011   COPD (chronic obstructive pulmonary disease) (HCC) 09/29/2011     Referrals to Alternative Service(s): Referred to Alternative Service(s):   Place:   Date:   Time:    Referred to Alternative Service(s):   Place:   Date:   Time:    Referred to Alternative Service(s):   Place:   Date:   Time:    Referred to Alternative Service(s):   Place:   Date:   Time:     Redmond Pulling, [facility] Comprehensive Clinical Assessment (CCA) Screening, Triage and Referral Note  04/29/2023 [name] [id number]  Chief Complaint:  Chief Complaint  Patient presents with   IVC   Visit Diagnosis:   Patient Reported Information How did you hear about [place]? Legal System  What Is the Reason for Your Visit/Call Today? Pt was IVC'd by her daughter for trying to take more medications as a suicide attempt. Pt denies, content of IVC and reports she can contract for safety. Pt also denies, HI, AVH, self-injurious behaviors and access to weapons.  How Long Has This Been Causing You Problems? <Week  What Do You Feel Would Help You the Most Today? Medication(s); Treatment for Depression or other mood problem   Have You Recently Had Any Thoughts About Hurting Yourself? Yes  Are You Planning to Commit Suicide/Harm Yourself At This time? Yes   Have you Recently Had Thoughts About Hurting Someone [name]? No  Are You Planning to Harm Someone at This Time? No  Explanation: Pt denies, HI.   Have You Used Any Alcohol or Drugs in the Past 24 Hours? Yes  How Long Ago Did You Use Drugs or Alcohol? Everyday.  What Did You Use and How Much? Pt reports, drinking 40.5 oz of beer and smoking a pack and a half of cigarettes daily.   Do You Currently Have a Therapist/Psychiatrist? No  Name of Therapist/Psychiatrist: None.   Have You Been Recently Discharged From Any Office Practice or Programs? No.  Explanation of Discharge From Practice/Program:  None.  CCA Screening Triage Referral Assessment Type of Contact: Tele-Assessment  Telemedicine Service Delivery: Telemedicine service delivery: This service was provided via telemedicine using a 2-way, interactive audio and video technology  Is this Initial or Reassessment? Is this Initial or Reassessment?: Initial Assessment  Date Telepsych consult ordered in [facility]:  Date Telepsych consult ordered in [facility]: 04/28/23  Time Telepsych consult ordered in [facility]:  Time Telepsych consult ordered in [facility]: 2057  Location of Assessment: [facility]  Provider Location: [facility] Assessment Services    Collateral Involvement: [name], daughter/IVC petitioner, [phone].   Does Patient Have a Automotive engineer Guardian? No. Name and Contact of Legal Guardian: Pt is her own guardian.  If Minor and Not Living with Parent(s), Who has Custody? Pt is an adult.  Is CPS involved or ever been involved? Never  Is APS involved or ever been involved? Never   Patient Determined To Be At Risk for Harm To Self or Others Based on Review of Patient Reported Information or Presenting Complaint? Yes, for Self-Harm (Per IVC however pt denies.)  Method: Plan with intent and identified person (Per IVC however pt denies.)  Availability of Means: In hand or used (Per IVC however pt denies.)  Intent: Clearly intends on inflicting harm that could cause death (Per IVC however pt denies.)  Notification Required: No need or identified person  Additional Information for Danger to Others Potential: -- (Pt denies, HI.)  Additional Comments for Danger to Others Potential: Pt denies, HI.  Are There Guns or Other Weapons in Your Home? No  Types of Guns/Weapons: Pt denies, access to weapons.  Are These Weapons Safely Secured?                            -- (Pt denies, access to weapons.)  Who Could Verify You Are Able To Have These Secured: Pt denies, access to weapons.  Do You Have any Outstanding Charges,  Pending Court Dates, Parole/Probation? Pt denies, legal involvement.  Contacted To Inform of Risk of Harm To Self or Others: Other: Comment (None.)   Does Patient Present under Involuntary Commitment? Yes    [place] of Residence: [place]   Patient Currently Receiving the Following Services: Not Receiving Services   Determination of Need: Emergent (2 hours)   Options For Referral: Inpatient Hospitalization; Medication Management; Outpatient Therapy   Discharge Disposition:  Discharge Disposition Medical Exam completed: Yes  Redmond Pulling, [facility]   Redmond Pulling, MS, [facility], [facility] Triage Specialist [phone]

## 2023-04-29 NOTE — BH Assessment (Addendum)
Clinician messaged Everardo Pacific D. [name], RN: "Hey. It's Trey with [facility]. Is the pt able to engage in the assessment, if so the pt will need to be placed in a private room. Can you fax pt's IVC to [phone]? Also is the pt medically cleared?"   Clinician awaiting response.    Redmond Pulling, MS, [facility], [facility] Triage Specialist [phone]

## 2023-04-29 NOTE — ED Notes (Signed)
Report given to Amber, Charity fundraiser at [facility].  Called and left VM for sheriff for transport.

## 2023-04-29 NOTE — Tx Team (Signed)
Initial Treatment Plan 04/29/2023 4:37 PM [name] [id number]    PATIENT STRESSORS: Other: Patient refused all admission questions.      PATIENT STRENGTHS: Other: Patient refused all admission questions.      PATIENT IDENTIFIED PROBLEMS: Other: Patient refused all admission questions.                        DISCHARGE CRITERIA:  Other: Patient refused all admission questions.     PRELIMINARY DISCHARGE PLAN: Other: Patient refused all admission questions.     PATIENT/FAMILY INVOLVEMENT: This treatment plan has been presented to and reviewed with the patient, [name]. The patient has been given the opportunity to ask questions and make suggestions.  Doneen Poisson, RN 04/29/2023, 4:37 PM

## 2023-04-29 NOTE — ED Provider Notes (Signed)
I was asked to complete the 1st exam Pt informed of admission EMTALA form completed    Zadie Rhine, MD 04/29/23 [phone]

## 2023-04-29 NOTE — Progress Notes (Signed)
Patient refused all admission questions. Stated she did not want to answer any questions at this time as she was upset b/c the last hospital lied to her, took her lunch from her, and didn't give her any of her morning medications.     Per report from nurse Lum Babe: patient admitted due to suicidal ideations.   Patient also refused skin assessment and she refused to sign admission forms.

## 2023-04-29 NOTE — Group Note (Unsigned)
Date:  04/29/2023 Time:  10:47 AM  Group Topic/Focus:  Movement Therapy     Participation Level:  {BHH PARTICIPATION WUJWJ:19147}  Participation Quality:  {BHH PARTICIPATION QUALITY:22265}  Affect:  {BHH AFFECT:22266}  Cognitive:  {BHH COGNITIVE:22267}  Insight: {BHH Insight2:20797}  Engagement in Group:  {BHH ENGAGEMENT IN WGNFA:21308}  Modes of Intervention:  {BHH MODES OF INTERVENTION:22269}  Additional Comments:  ***  Rodena Goldmann 04/29/2023, 10:47 AM

## 2023-04-30 DIAGNOSIS — F250 Schizoaffective disorder, bipolar type: Secondary | ICD-10-CM | POA: Diagnosis not present

## 2023-04-30 MED ORDER — GABAPENTIN 300 MG PO CAPS
300.0000 mg | ORAL_CAPSULE | Freq: Three times a day (TID) | ORAL | Status: DC
  Administered 2023-04-30 (×2): 300 mg via ORAL
  Filled 2023-04-30 (×3): qty 1

## 2023-04-30 MED ORDER — OLANZAPINE 5 MG PO TABS
10.0000 mg | ORAL_TABLET | Freq: Four times a day (QID) | ORAL | Status: DC | PRN
  Administered 2023-04-30: 10 mg via ORAL
  Filled 2023-04-30 (×2): qty 2

## 2023-04-30 MED ORDER — OLANZAPINE 10 MG IM SOLR
10.0000 mg | Freq: Four times a day (QID) | INTRAMUSCULAR | Status: DC | PRN
  Administered 2023-05-01: 10 mg via INTRAMUSCULAR
  Filled 2023-04-30 (×2): qty 10

## 2023-04-30 MED ORDER — LORAZEPAM 1 MG PO TABS
1.0000 mg | ORAL_TABLET | Freq: Three times a day (TID) | ORAL | Status: DC | PRN
  Administered 2023-04-30: 1 mg via ORAL
  Filled 2023-04-30: qty 1

## 2023-04-30 MED ORDER — LORAZEPAM 2 MG/ML IJ SOLN
1.0000 mg | Freq: Three times a day (TID) | INTRAMUSCULAR | Status: DC | PRN
  Administered 2023-05-01: 1 mg via INTRAMUSCULAR
  Filled 2023-04-30: qty 1

## 2023-04-30 MED ORDER — DIVALPROEX SODIUM 250 MG PO DR TAB
500.0000 mg | DELAYED_RELEASE_TABLET | Freq: Two times a day (BID) | ORAL | Status: DC
  Administered 2023-04-30 (×2): 500 mg via ORAL
  Filled 2023-04-30 (×3): qty 2

## 2023-04-30 MED ORDER — NICOTINE POLACRILEX 2 MG MT GUM
2.0000 mg | CHEWING_GUM | OROMUCOSAL | Status: DC | PRN
  Administered 2023-04-30 – 2023-05-01 (×2): 2 mg via ORAL
  Filled 2023-04-30 (×3): qty 1

## 2023-04-30 NOTE — Group Note (Signed)
Recreation Therapy Group Note   Group Topic:Relaxation  Group Date: 04/30/2023 Start Time: 1400 End Time: 1445 Facilitators: Rosina Lowenstein, LRT, CTRS Location:  Dayroom   Group Description: PMR (Progressive Muscle Relaxation). LRT asks patients their current level of stress/anxiety from 1-10, with 10 being the highest. LRT educates patients on what PMR is and the benefits that come from it. Patients are asked to sit with their feet flat on the floor while sitting up and all the way back in their chair, if possible. LRT and pts follow a prompt through a speaker that requires you to tense and release different muscles in their body and focus on their breathing. During session, lights are off and soft music is being played. Pts are given a stress ball to use if needed. At the end of the prompt, LRT asks patients to rank their current levels of stress/anxiety from 1-10, 10 being the highest.  Goal Area(s) Addressed:  Patients will be able to describe progressive muscle relaxation.  Patient will practice using relaxation technique. Patient will identify a new coping skill.  Patient will follow multistep directions to reduce anxiety and stress.  Affect/Mood: Blunted and Labile   Participation Level: Minimal   Participation Quality: Independent   Behavior: Disinterested   Speech/Thought Process: Distracted   Insight: Limited   Judgement: Limited   Modes of Intervention: Activity   Patient Response to Interventions:  Disengaged   Education Outcome:  In group clarification offered    Clinical Observations/Individualized Feedback: Besse was minimally active in their participation of session activities and group discussion. Pt identified that her stress and anxiety were a 3 before the session and after they were an 8. Pt did not follow along or engage in activity.    Plan: Continue to engage patient in RT group sessions 2-3x/week.   Rosina Lowenstein, LRT, CTRS 04/30/2023 3:14 PM

## 2023-04-30 NOTE — [facility] Counselor (Signed)
CSW met with the patient to complete the PSA.  Patient did not complete the PSA in it's entirety.  Patient became escalated and stated "don't wake me up for this stupid sh*t" before leaving the room.  Consents and Medicare Rights not reviewed.  Penni Homans, MSW, LCSW 04/30/2023 3:51 PM

## 2023-04-30 NOTE — Plan of Care (Signed)

## 2023-04-30 NOTE — BH Assessment (Signed)
Recreation Therapy Notes  INPATIENT RECREATION THERAPY ASSESSMENT  Patient Details Name: [name] MRN: [id number] DOB: 01-15-1966 Today's Date: 04/30/2023       Information Obtained From: Patient (In addition to chart review)  Able to Participate in Assessment/Interview:  Yes  Patient Presentation: Resistant, Disheveled, Labile  Reason for Admission (Per Patient):  ("nothing, I am ready to go")  Patient Stressors:  ("nothing")  Coping Skills:    ("nothing")  Leisure Interests (2+):   ("I drink beer and smoke cigarettes")  Frequency of Recreation/Participation:  Unable to identify   Awareness of Community Resources:    Unable to identify   [employer]:    Unable to identify   Current Use:   Unable to identify   Expressed Interest in [employer] Information:   Unable to identify   [place] of Residence:  "[place]"  Patient Main Form of Transportation: Walk  Patient Strengths:  "I am good at everything I do"  Patient Identified Areas of Improvement:  "Nothing"  Patient Goal for Hospitalization:  "Go home...get better"  Current SI (including self-harm):  No  Current HI:  No  Current AVH: No  Staff Intervention Plan: Group Attendance, Collaborate with Interdisciplinary Treatment Team  Consent to Intern Participation: N/A   [name] seemed preoccupied with being able to smoke a cigarette. Pt mentioned this twice during the short time we talked. Pt was blunted and kept responses short.    [name] LRT, CTRS [name] 04/30/2023, 3:35 PM
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
## 2023-04-30 NOTE — Plan of Care (Signed)
New goal as of 04/30/23  Problem: Group Participation Goal: STG - Patient will engage in groups without prompting or encouragement from LRT x3 group sessions within 5 recreation therapy group sessions Description: STG - Patient will engage in groups without prompting or encouragement from LRT x3 group sessions within 5 recreation therapy group sessions Outcome: Not Applicable

## 2023-04-30 NOTE — [facility] Suicide Risk Assessment (Signed)
[facility] Admission Suicide Risk Assessment   Nursing information obtained from:  Patient Demographic factors:  NA Current Mental Status:  NA Loss Factors:  NA Historical Factors:  NA Risk Reduction Factors:  NA  Total Time spent with patient: 1 hour Principal Problem: MDD (major depressive disorder) Diagnosis:  Principal Problem:   MDD (major depressive disorder)  Subjective Data: Pt was IVC'd by her daughter [name], [phone]). Per IVC paperwork: "Respondent has been committed multiple times in the past. Respondent has stated that she wants to be left alone to die. Respondent has ingested an unknown amount of pills and is trying to take more. Without intervention could continue to harm herself."   Continued Clinical Symptoms:  Alcohol Use Disorder Identification Test Final Score (AUDIT): 0 The "Alcohol Use Disorders Identification Test", Guidelines for Use in Primary Care, Second Edition.  World Science writer [facility]). Score between 0-7:  no or low risk or alcohol related problems. Score between 8-15:  moderate risk of alcohol related problems. Score between 16-19:  high risk of alcohol related problems. Score 20 or above:  warrants further diagnostic evaluation for alcohol dependence and treatment.   CLINICAL FACTORS:   Bipolar Disorder:   Mixed State Personality Disorders:   Cluster B   Musculoskeletal: Strength & Muscle Tone: within normal limits Gait & Station: normal Patient leans: N/A  Psychiatric Specialty Exam:  Presentation  General Appearance:  Appropriate for Environment; Casual  Eye Contact: Good  Speech: Clear and Coherent; Normal Rate  Speech Volume: Normal  Handedness: Right   Mood and Affect  Mood: Euthymic  Affect: Appropriate; Congruent   Thought Process  Thought Processes: Coherent; Linear; Goal Directed  Descriptions of Associations:Intact  Orientation:Full (Time, Place and Person)  Thought  Content:Tangential  History of Schizophrenia/Schizoaffective disorder:No  Duration of Psychotic Symptoms:Greater than six months  Hallucinations:No data recorded Ideas of Reference:None  Suicidal Thoughts:No data recorded Homicidal Thoughts:No data recorded  Sensorium  Memory: Immediate Fair; Remote Fair  Judgment: Fair  Insight: Fair   Art therapist  Concentration: Fair  Attention Span: Good  Recall: Good  Fund of Knowledge: Good  Language: Fair   Psychomotor Activity  Psychomotor Activity:No data recorded  Assets  Assets: Desire for Improvement; Communication Skills; Financial Resources/Insurance; Physical Health; Social Support; Transportation   Sleep  Sleep:No data recorded    Blood pressure 120/74, pulse 92, temperature 98.6 F (37 C), resp. rate 18, height 5\' 2"  (1.575 m), weight 52.6 kg, last menstrual period 01/08/2011, SpO2 100%. Body mass index is 21.22 kg/m.   COGNITIVE FEATURES THAT CONTRIBUTE TO RISK:  Closed-mindedness and Thought constriction (tunnel vision)    SUICIDE RISK:   Minimal: No identifiable suicidal ideation.  Patients presenting with no risk factors but with morbid ruminations; may be classified as minimal risk based on the severity of the depressive symptoms  PLAN OF CARE: See orders  I certify that inpatient services furnished can reasonably be expected to improve the patient's condition.   Aleshia Cartelli [place], DO 04/30/2023, 11:13 AM

## 2023-04-30 NOTE — Group Note (Signed)
Date:  04/30/2023 Time:  10:57 AM  Group Topic/Focus:  Outside Rec/Music Therapy This group is to allows  patients to get fresh air participating in outside activities while listening to their favorite music.     Participation Level:  None  Participation Quality:  Intrusive  Affect:  Anxious  Cognitive:  Disorganized  Insight: None  Engagement in Group:  None  Modes of Intervention:  Activity and Discussion  Additional Comments:  Pt was very upset/anxious about not being able to leave and not being able to smoke a cigarette   [name] 04/30/2023, 10:57 AM

## 2023-04-30 NOTE — Group Note (Signed)
Date:  04/30/2023 Time:  9:31 PM  Group Topic/Focus:  Wrap-Up Group:   The focus of this group is to help patients review their daily goal of treatment and discuss progress on daily workbooks.    Participation Level:  Did Not Attend   Additional Comments:    Osker Mason 04/30/2023, 9:31 PM
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
## 2023-04-30 NOTE — Progress Notes (Signed)
Patient admitted to [name] with MDD and SI - per her family she threatened to hurt herself. Patient has hx of SAD - bipolar type, ETOH dependence, polysubstance abuse. Once she was medicated for her agitation, pt was calmer, slept for awhile, but when SW tried to speak with her, her agitation increased but she was able to calm herself down.  Patient is labile, going from dancing in the dayroom and laughing loudly and then loud and wanting to leave the next minute. Patient denies SI, HI, AVH, depression and anxiety. Will continue to monitor.

## 2023-04-30 NOTE — Progress Notes (Signed)
Patient very antsy, requesting her cigarettes, refusing nicotine gum, called family insisting they come and get her, labile with her emotions. Threatened to "mess this place up" if we didn't let her leave. Tore off her armband then stated she wasn't admitted b/c she didn't have an armband. Refused her 10am meds but later agreed to take them. Keeps insisting that she didn't eat breakfast (she ate a boiled egg, coffee and juice). Was given an additional juice and crackers then again stated she hadn't eaten and wanted food. When she found out she is IVC'd she became angry. Is getting agitated - spoke with the doctor and medications for agitation adjusted. Pulling her shirt up walking around refusing to cover herself up yelling that she wants to leave. Dr ordered 1 mg ativan and 10 mg zyprexa which the patient at first refused oral then once security arrived she agreed to oral medication.

## 2023-05-01 DIAGNOSIS — F250 Schizoaffective disorder, bipolar type: Secondary | ICD-10-CM | POA: Diagnosis not present

## 2023-05-01 MED ORDER — ARIPIPRAZOLE 10 MG PO TABS: 10.0000 mg | ORAL_TABLET | Freq: Every day | ORAL | 3 refills | Status: DC

## 2023-05-01 MED ORDER — DIVALPROEX SODIUM 500 MG PO DR TAB: 500.0000 mg | DELAYED_RELEASE_TABLET | Freq: Two times a day (BID) | ORAL | 3 refills | Status: DC

## 2023-05-01 MED ORDER — GABAPENTIN 300 MG PO CAPS: 300.0000 mg | ORAL_CAPSULE | Freq: Three times a day (TID) | ORAL | 3 refills | Status: DC

## 2023-05-01 MED ORDER — LEVETIRACETAM ER 500 MG PO TB24: 500.0000 mg | ORAL_TABLET | Freq: Two times a day (BID) | ORAL | 3 refills | Status: DC

## 2023-05-01 NOTE — Progress Notes (Signed)
   05/01/23 0656  15 Minute Checks  Location Bedroom  Visual Appearance Calm  Behavior Composed  Sleep (Behavioral Health Patients Only)  Calculate sleep? (Click Yes once per 24 hr at 0600 safety check) Yes  Documented sleep last 24 hours 10.5

## 2023-05-01 NOTE — Progress Notes (Signed)
Patient refused to verify her belongings. Refused her cab voucher saying she will pay for her own bus to leave now. Escorted to the exit by MHT with belongings, paperwork,

## 2023-05-01 NOTE — Progress Notes (Signed)
Patient had to be medicated with agitation medications as she was at the front desk yelling, refusing meds, refusing to go to her room, fake gagging in front of other patients making them very uncomfortable. IM ativan and zyprexa given at 9:47 along with her albuteral but she refused all other medications. Patient continued to stand at the desk refusing to allow privacy. Dr. Marlou Porch is discharging today. Patient escorted to her room with security to allow [place] to get her discharge together.

## 2023-05-01 NOTE — BHH Counselor (Signed)
Pt declined signing IM. Provider aware and pt will be discharged today without IM.    Reynaldo Minium, MSW, [place] 05/01/2023 11:45 AM

## 2023-05-01 NOTE — [facility] Suicide Risk Assessment (Signed)
[facility] INPATIENT:  Family/Significant Other Suicide Prevention Education  Suicide Prevention Education:  Patient Refusal for Family/Significant Other Suicide Prevention Education: The patient [name] has refused to provide written consent for family/significant other to be provided Family/Significant Other Suicide Prevention Education during admission and/or prior to discharge.  Physician notified.  Elza Rafter 05/01/2023, 11:45 AM

## 2023-05-01 NOTE — Evaluation (Signed)
Physical Therapy Evaluation Patient Details Name: [name] MRN: [id number] DOB: 01-27-1966 Today's Date: 05/01/2023  History of Present Illness  Pt is a 57 yo female that presented to ED due to threatening to hurt herself.  PMH: polysubstance abuse, anxiety and depression, A fib, chronic back pain, COPD, DM type 2 w/ neuropathy, GERD, HTN, HLD, Hypothyroidism, Schizoaffective disorder/bipolar type, s/p Roun-en-Y Gastric bypass .   Clinical Impression  Pt standing and walking in hallway on unit upon PT arrival. Speaking with nursing/security due to her frustration/agitation about her breakfast being thrown away and feeling nauseous from her medicine. Noted to be ambulating independently for several minutes prior to PT approach. She was able to ambulate >279ft and provide PLOF (living alone with family nearby and a PCA that just started is available 7 days a week, for 2 hours in the morning). Pt denied any falls and stated she is able to perform her on ADLs. No apparent balance difficulties though mild antalgic gait noted on LLE, pt reported this is chronic. The patient demonstrated and reported return to baseline level of functioning, no further acute PT needs indicated. PT to sign off. Please reconsult PT if pt status changes or acute needs are identified.          If plan is discharge home, recommend the following: Assist for transportation   Can travel by private vehicle        Equipment Recommendations None recommended by PT  Recommendations for Other Services       Functional Status Assessment Patient has not had a recent decline in their functional status     Precautions / Restrictions Precautions Precautions: None Restrictions Weight Bearing Restrictions: No      Mobility  Bed Mobility                    Transfers                        Ambulation/Gait Ambulation/Gait assistance: Independent Gait Distance (Feet): 300 Feet            General Gait Details: somewhat antaglic gait on RLE, pt reported this is baseline  Stairs            Wheelchair Mobility     Tilt Bed    Modified Rankin (Stroke Patients Only)       Balance Overall balance assessment: No apparent balance deficits (not formally assessed)                                           Pertinent Vitals/Pain Pain Assessment Pain Assessment: No/denies pain (nausea from not eating per pt)    Home Living Family/patient expects to be discharged to:: Private residence Living Arrangements: Alone (brother stays with her, but currently with his mother due to his recent surgery) Available Help at Discharge: Personal care attendant (aide just started pt not sure of details but thinks it's 7 days a week for 2hours in the morning. to assist with cooking/cleaning/transportation) Type of Home: House Home Access: Stairs to enter Entrance Stairs-Rails: Can reach both Entrance Stairs-Number of Steps: 5   Home Layout: One level Home Equipment: Agricultural consultant (2 wheels);Cane - single point      Prior Function Prior Level of Function : Independent/Modified Independent             Mobility Comments:  pt denies any falls       Extremity/Trunk Assessment   Upper Extremity Assessment Upper Extremity Assessment: Overall WFL for tasks assessed    Lower Extremity Assessment Lower Extremity Assessment:  (pt declined MMT due to not wanting to sit, but reference chronic RLE pain)       Communication      Cognition Arousal: Alert Behavior During Therapy: WFL for tasks assessed/performed, Agitated (agitated about breakfast but behavior appropriate with PT) Overall Cognitive Status: Within Functional Limits for tasks assessed                                          General Comments      Exercises     Assessment/Plan    PT Assessment Patient does not need any further PT services  PT Problem List         PT  Treatment Interventions      PT Goals (Current goals can be found in the Care Plan section)       Frequency       Co-evaluation               AM-PAC PT "6 Clicks" Mobility  Outcome Measure Help needed turning from your back to your side while in a flat bed without using bedrails?: None Help needed moving from lying on your back to sitting on the side of a flat bed without using bedrails?: None Help needed moving to and from a bed to a chair (including a wheelchair)?: None Help needed standing up from a chair using your arms (e.g., wheelchair or bedside chair)?: None Help needed to walk in hospital room?: None Help needed climbing 3-5 steps with a railing? : None 6 Click Score: 24    End of Session   Activity Tolerance: Patient tolerated treatment well Patient left: Other (comment) (standing in hallway at nursing station) Nurse Communication: Mobility status PT Visit Diagnosis: Other abnormalities of gait and mobility (R26.89)    Time: 1023-1035 PT Time Calculation (min) (ACUTE ONLY): 12 min   Charges:   PT Evaluation $PT Eval Low Complexity: 1 Low   PT General Charges $$ ACUTE PT VISIT: 1 Visit        Olga Coaster PT, DPT 11:22 AM,05/01/23

## 2023-05-01 NOTE — Progress Notes (Signed)
  [facility] Adult Case Management Discharge Plan :  Will you be returning to the same living situation after discharge:  Yes,  pt will return home  At discharge, do you have transportation home?: Yes,  CSW will assist with transportation  Do you have the ability to pay for your medications: Yes,  [employer] MEDICARE / Suan Halter DUAL COMPLETE  Release of information consent forms completed and in the chart;  Patient's signature needed at discharge.  Patient to Follow up at:  Follow-up Information     [facility]. Go to.   Why: Although you declined scheduled follow-up. I am putting walk-in hours below:  Hours of Operation: Monday-Friday, 8 a.m. - 5 p.m. Office closed for lunch, 12 p.m. - 1 p.m. The last Open Access walk-in will be taken at 3 p.m. each day Contact information: [address]  [address] [place] [address] [phone]                 Next level of care provider has access to [facility] Link:no  Safety Planning and Suicide Prevention discussed: Yes,  pt declined, SPE brochure gone over with pt     Has patient been referred to the Quitline?: Patient refused referral for treatment  Patient has been referred for addiction treatment: Patient refused referral for treatment.  [address], LCSWA 05/01/2023, 11:44 AM

## 2023-05-01 NOTE — Plan of Care (Addendum)
Pt received in bed with eyes closed. RR even and unlabored.  Calm and cooperative. C/o back pain. Scheduled medications administered to patient, per MD orders. PRN given for pain. Support and encouragement provided.  Routine safety checks conducted every 15 minutes.  Patient informed to notify staff with problems or concerns. Patient interacts well with others on the unit.  No behavior issues noted. Denies SI/HI/AV/H.  Patient remains safe at this time.  Problem: Activity: Goal: Interest or engagement in activities will improve Outcome: Progressing Goal: Sleeping patterns will improve Outcome: Progressing  Problem: Coping: Goal: Ability to verbalize frustrations and anger appropriately will improve Outcome: Progressing Goal: Ability to demonstrate self-control will improve Outcome: Progressing   Problem: Safety: Goal: Periods of time without injury will increase Outcome: Progressing

## 2023-05-01 NOTE — Discharge Summary (Signed)
Physician Discharge Summary Note  Patient:  [name] is an 57 y.o., female MRN:  [id number] DOB:  [birth year] Patient phone:  [phone] (home)  Patient address:   [address] [place] [address],  Total Time spent with patient: 1 hour  Date of Admission:  04/29/2023 Date of Discharge: 05/01/2023  Reason for Admission:  [name] is a 57 year old African-American female with long history of polysubstance abuse including cocaine and alcohol and psychiatric admission for detox and suicide attempts.  Chart is reviewed and recent notes state that she took an overdose of her medications according to her daughter but she was medically cleared and transferred from [facility].  She states that she did not take an overdose and she is not suicidal.  She does admit to drinking beer.  She denies depression.  She denies any suicidal ideation.  She denies any auditory or visual hallucinations.  She does present manic and with a tangential and circumstantial thought process.  After receiving some as needed medications this morning she is pleasant and cooperative.   Principal Problem: Schizoaffective disorder [facility]) Discharge Diagnoses: Principal Problem:   Schizoaffective disorder (HCC) Active Problems:   Withdrawal symptoms, alcohol (HCC)   Past Psychiatric History: Schizoaffective disorder and polysubstance abuse.  Past Medical History:  Past Medical History:  Diagnosis Date   Abdominal pain    Accidental drug overdose April 2013   Anxiety    Atrial fibrillation (HCC) 09/29/11   converted spontaneously   Chronic back pain    Chronic knee pain    Chronic nausea    Chronic pain    COPD (chronic obstructive pulmonary disease) (HCC)    Depression    Diabetes mellitus    states her doctor took her off all DM meds in past month   Diabetic neuropathy (HCC)    Dyspnea    with exertion    GERD (gastroesophageal reflux disease)    Headache(784.0)    migraines    HTN (hypertension)     not on meds since in a year    Hyperlipidemia    Hypothyroidism    not on meds in a while    Mental disorder    Bipolar and schizophrenic   Nausea and vomiting 01/02/2023   Requires supplemental oxygen    as needed per patient    Schizophrenia (HCC)    Schizophrenia, acute (HCC) 11/13/2017   Tobacco abuse     Past Surgical History:  Procedure Laterality Date   ABDOMINAL HYSTERECTOMY     BLADDER SUSPENSION  03/04/2011   Procedure: [facility] PROCEDURE;  Surgeon: Jonna Coup MacDiarmid;  Location: [facility] ORS;  Service: Urology;  Laterality: N/A;   BOWEL RESECTION N/A 04/18/2022   Procedure: SMALL BOWEL RESECTION;  Surgeon: Harriette Bouillon, MD;  Location: [facility] OR;  Service: General;  Laterality: N/A;   CYSTOCELE REPAIR  03/04/2011   Procedure: ANTERIOR REPAIR (CYSTOCELE);  Surgeon: Martina Sinner;  Location: [facility];  Service: Urology;  Laterality: N/A;   CYSTOSCOPY  03/04/2011   Procedure: CYSTOSCOPY;  Surgeon: Jonna Coup MacDiarmid;  Location: [facility];  Service: Urology;  Laterality: N/A;   ESOPHAGOGASTRODUODENOSCOPY (EGD) WITH PROPOFOL N/A 05/12/2017   Procedure: ESOPHAGOGASTRODUODENOSCOPY (EGD) WITH PROPOFOL;  Surgeon: Ovidio Kin, MD;  Location: [facility];  Service: General;  Laterality: N/A;   GASTRIC ROUX-EN-Y N/A 03/25/2016   Procedure: LAPAROSCOPIC ROUX-EN-Y GASTRIC BYPASS WITH UPPER ENDOSCOPY;  Surgeon: Glenna Fellows, MD;  Location: [facility];  Service: General;  Laterality: N/A;   KNEE SURGERY  LAPAROSCOPIC ASSISTED VAGINAL HYSTERECTOMY  03/04/2011   Procedure: LAPAROSCOPIC ASSISTED VAGINAL HYSTERECTOMY;  Surgeon: Jeani Hawking, MD;  Location: [facility];  Service: Gynecology;  Laterality: N/A;   LAPAROTOMY N/A 04/18/2022   Procedure: EXPLORATORY LAPAROTOMY;  Surgeon: Harriette Bouillon, MD;  Location: [facility] OR;  Service: General;  Laterality: N/A;   LAPAROTOMY N/A 04/24/2022   Procedure: BRING BACK EXPLORATORY LAPAROTOMY;  Surgeon: Violeta Gelinas, MD;  Location: [facility] OR;  Service: General;   Laterality: N/A;   TOTAL HIP ARTHROPLASTY Right 08/27/2022   Procedure: TOTAL HIP ARTHROPLASTY;  Surgeon: Joen Laura, MD;  Location: [facility] OR;  Service: Orthopedics;  Laterality: Right;   Family History:  Family History  Problem Relation Age of Onset   Heart attack Father        38s   Diabetes Mother    Heart disease Mother    Hypertension Mother    Heart attack Sister        63   COPD Other    Breast cancer Neg Hx    Family Psychiatric  History: Unremarkable Social History:  Social History   Substance and Sexual Activity  Alcohol Use Yes   Comment: 40 ounces daily     Social History   Substance and Sexual Activity  Drug Use Yes   Types: Cocaine, Marijuana, "Crack" cocaine   Comment: last use- 03/19/2023    Social History   Socioeconomic History   Marital status: Legally Separated    Spouse name: Not on file   Number of children: Not on file   Years of education: Not on file   Highest education level: Not on file  Occupational History   Not on file  Tobacco Use   Smoking status: Every Day    Current packs/day: 1.00    Types: Cigarettes   Smokeless tobacco: Never  Vaping Use   Vaping status: Never Used  Substance and Sexual Activity   Alcohol use: Yes    Comment: 40 ounces daily   Drug use: Yes    Types: Cocaine, Marijuana, "Crack" cocaine    Comment: last use- 03/19/2023   Sexual activity: Not Currently    Partners: Male  Other Topics Concern   Not on file  Social History Narrative   Not on file   Social Determinants of Health   Financial Resource Strain: Not on file  Food Insecurity: Patient Declined (04/29/2023)   Hunger Vital Sign    Worried About Running Out of Food in the Last Year: Patient declined    Ran Out of Food in the Last Year: Patient declined  Transportation Needs: Patient Declined (04/29/2023)   PRAPARE - Administrator, Civil Service (Medical): Patient declined    Lack of Transportation (Non-Medical): Patient  declined  Physical Activity: Not on file  Stress: Stress Concern Present (04/08/2023)   [employer] of Occupational Health - Occupational Stress Questionnaire    Feeling of Stress : Rather much  Social Connections: Unknown (01/06/2023)   [facility]   Social Network    Social Network: Not on file    Hospital Course: Irving Burton is a 57 year old African-American female who has had numerous psychiatric admissions for similar presentation of hypomania and alcohol abuse.  She apparently has not been taking her medications.  While on the unit we changed her Tegretol to Depakote and restarted her on her home medications.  She says that she was drinking beer and was making threats to her daughter who took out a petition  paperwork.  She said that she had overdosed but she denied it and she was medically cleared in the ER.  She denied being suicidal and asked to be discharged.  She was compliant with medications on the first 2 days but then became more agitated asking to leave and disruptive to the unit and started refusing medications.  She was never in withdrawal and her vital signs remained stable.  It was felt that she maximized hospitalization she was discharged home.  On the day of discharge she denied suicidal ideation, homicidal ideation, auditory or visual hallucinations.  Her judgment and insight were fair.  Physical Findings: AIMS:  , ,  ,  ,    CIWA:    COWS:     Musculoskeletal: Strength & Muscle Tone: within normal limits Gait & Station: normal Patient leans: N/A   Psychiatric Specialty Exam:  Presentation  General Appearance:  Appropriate for Environment; Casual  Eye Contact: Good  Speech: Clear and Coherent; Normal Rate  Speech Volume: Normal  Handedness: Right   Mood and Affect  Mood: Euthymic  Affect: Appropriate; Congruent   Thought Process  Thought Processes: Coherent; Linear; Goal Directed  Descriptions of  Associations:Intact  Orientation:Full (Time, Place and Person)  Thought Content:Tangential  History of Schizophrenia/Schizoaffective disorder:No  Duration of Psychotic Symptoms:Greater than six months  Hallucinations:No data recorded Ideas of Reference:None  Suicidal Thoughts:No data recorded Homicidal Thoughts:No data recorded  Sensorium  Memory: Immediate Fair; Remote Fair  Judgment: Fair  Insight: Fair   Art therapist  Concentration: Fair  Attention Span: Good  Recall: Good  Fund of Knowledge: Good  Language: Fair   Psychomotor Activity  Psychomotor Activity:No data recorded  Assets  Assets: Desire for Improvement; Communication Skills; Financial Resources/Insurance; Physical Health; Social Support; Transportation   Sleep  Sleep:No data recorded   Physical Exam: Physical Exam ROS Blood pressure (!) 86/59, pulse 89, temperature (!) 97.3 F (36.3 C), resp. rate 18, height 5\' 2"  (1.575 m), weight 52.6 kg, last menstrual period 01/08/2011, SpO2 100%. Body mass index is 21.22 kg/m.   Social History   Tobacco Use  Smoking Status Every Day   Current packs/day: 1.00   Types: Cigarettes  Smokeless Tobacco Never   Tobacco Cessation:  A prescription for an FDA-approved tobacco cessation medication was offered at discharge and the patient refused   Blood Alcohol level:  Lab Results  Component Value Date   ETH <10 04/28/2023   ETH 41 (H) 04/16/2023    Metabolic Disorder Labs:  Lab Results  Component Value Date   HGBA1C 7.3 (H) 01/11/2023   MPG 162.81 01/11/2023   MPG 163 09/19/2022   Lab Results  Component Value Date   PROLACTIN 32.8 (H) 11/15/2017   Lab Results  Component Value Date   CHOL 115 09/02/2022   TRIG 73 09/02/2022   HDL 50 09/02/2022   CHOLHDL 2.3 09/02/2022   VLDL 15 09/02/2022   LDLCALC 50 09/02/2022   LDLCALC 48 08/29/2022    See Psychiatric Specialty Exam and Suicide Risk Assessment completed by  Attending Physician prior to discharge.  Discharge destination:  Home  Is patient on multiple antipsychotic therapies at discharge:  No   Has Patient had three or more failed trials of antipsychotic monotherapy by history:  No  Recommended Plan for Multiple Antipsychotic Therapies: NA   Allergies as of 05/01/2023       Reactions   Iron Dextran Shortness Of Breath, Anxiety   Aspirin Nausea And Vomiting, Other (See Comments)  Ok to take tylenol or ibuprofen   Penicillins Other (See Comments)   Unknown. Childhood reaction         Medication List     STOP taking these medications    acetaminophen 325 MG tablet Commonly known as: TYLENOL   carbamazepine 200 MG tablet Commonly known as: TEGretol   doxycycline 100 MG capsule Commonly known as: VIBRAMYCIN   levETIRAcetam 500 MG tablet Commonly known as: Keppra Replaced by: levETIRAcetam 500 MG 24 hr tablet   nicotine 21 mg/24hr patch Commonly known as: NICODERM CQ - dosed in mg/24 hours   nicotine polacrilex 2 MG gum Commonly known as: NICORETTE       TAKE these medications      Indication  albuterol 108 (90 Base) MCG/ACT inhaler Commonly known as: VENTOLIN HFA Inhale 1-2 puffs into the lungs every 6 (six) hours as needed for wheezing or shortness of breath.    ARIPiprazole 10 MG tablet Commonly known as: ABILIFY Take 1 tablet (10 mg total) by mouth daily. Start taking on: May 02, 2023 What changed: additional instructions  Indication: MIXED BIPOLAR AFFECTIVE DISORDER   budesonide-formoterol 160-4.5 MCG/ACT inhaler Commonly known as: Symbicort Inhale 2 puffs into the lungs 2 (two) times daily.    cyanocobalamin 1000 MCG tablet Commonly known as: VITAMIN B12 Take 1 tablet (1,000 mcg total) by mouth daily.    diclofenac Sodium 1 % Gel Commonly known as: VOLTAREN Apply 2 g topically 4 (four) times daily.    divalproex 500 MG DR tablet Commonly known as: DEPAKOTE Take 1 tablet (500 mg total) by  mouth every 12 (twelve) hours.  Indication: Manic Phase of Manic-Depression   famotidine 20 MG tablet Commonly known as: PEPCID Take 1 tablet (20 mg total) by mouth daily.    FeroSul 325 (65 Fe) MG tablet Generic drug: ferrous sulfate Take 1 tablet (325 mg total) by mouth every other day.    folic acid 1 MG tablet Commonly known as: FOLVITE Take 1 tablet (1 mg total) by mouth daily.    furosemide 20 MG tablet Commonly known as: LASIX Take 1 tablet (20 mg total) by mouth daily.    gabapentin 300 MG capsule Commonly known as: NEURONTIN Take 1 capsule (300 mg total) by mouth 3 (three) times daily. What changed:  medication strength how much to take  Indication: Abuse or Misuse of Alcohol   hyoscyamine 0.125 MG SL tablet Commonly known as: Levsin/SL Place 1 tablet (0.125 mg total) under the tongue every 4 (four) hours as needed (pain). Up to 1.25 mg daily    levETIRAcetam 500 MG 24 hr tablet Commonly known as: KEPPRA XR Take 1 tablet (500 mg total) by mouth 2 (two) times daily. Replaces: levETIRAcetam 500 MG tablet  Indication: Seizure   metFORMIN 750 MG 24 hr tablet Commonly known as: GLUCOPHAGE-XR Take 750 mg by mouth daily.    ondansetron 4 MG disintegrating tablet Commonly known as: ZOFRAN-ODT Take 1 tablet (4 mg total) by mouth every 8 (eight) hours as needed for nausea or vomiting.    ondansetron 4 MG tablet Commonly known as: ZOFRAN Take 1 tablet (4 mg total) by mouth every 6 (six) hours.    OXYGEN Inhale 4-5 L into the lungs as needed (COPD).    pantoprazole 40 MG tablet Commonly known as: PROTONIX Take 1 tablet (40 mg total) by mouth daily.    predniSONE 20 MG tablet Commonly known as: DELTASONE Take 2 tablets (40 mg total) by mouth daily with breakfast.  sucralfate 1 g tablet Commonly known as: Carafate Take 1 tablet (1 g total) by mouth 4 (four) times daily -  with meals and at bedtime.    tiZANidine 2 MG tablet Commonly known as:  ZANAFLEX Take 2 mg by mouth at bedtime.    zonisamide 100 MG capsule Commonly known as: ZONEGRAN Take 2 capsules (200 mg total) by mouth daily.          Follow-up recommendations:  See SW Note    Signed: Sarina Ill, DO 05/01/2023, 11:07 AM

## 2023-05-01 NOTE — [facility] Suicide Risk Assessment (Signed)
[facility] Discharge Suicide Risk Assessment   Principal Problem: Schizoaffective disorder [facility]) Discharge Diagnoses: Principal Problem:   Schizoaffective disorder (HCC) Active Problems:   Withdrawal symptoms, alcohol (HCC)   Total Time spent with patient: 1 hour  Musculoskeletal: Strength & Muscle Tone: within normal limits Gait & Station: normal Patient leans: N/A  Psychiatric Specialty Exam  Presentation  General Appearance:  Appropriate for Environment; Casual  Eye Contact: Good  Speech: Clear and Coherent; Normal Rate  Speech Volume: Normal  Handedness: Right   Mood and Affect  Mood: Euthymic  Duration of Depression Symptoms: No data recorded Affect: Appropriate; Congruent   Thought Process  Thought Processes: Coherent; Linear; Goal Directed  Descriptions of Associations:Intact  Orientation:Full (Time, Place and Person)  Thought Content:Tangential  History of Schizophrenia/Schizoaffective disorder:No  Duration of Psychotic Symptoms:Greater than six months  Hallucinations:No data recorded Ideas of Reference:None  Suicidal Thoughts:No data recorded Homicidal Thoughts:No data recorded  Sensorium  Memory: Immediate Fair; Remote Fair  Judgment: Fair  Insight: Fair   Art therapist  Concentration: Fair  Attention Span: Good  Recall: Good  Fund of Knowledge: Good  Language: Fair   Psychomotor Activity  Psychomotor Activity:No data recorded  Assets  Assets: Desire for Improvement; Communication Skills; Financial Resources/Insurance; Physical Health; Social Support; Transportation   Sleep  Sleep:No data recorded   Blood pressure (!) 86/59, pulse 89, temperature (!) 97.3 F (36.3 C), resp. rate 18, height 5\' 2"  (1.575 m), weight 52.6 kg, last menstrual period 01/08/2011, SpO2 100%. Body mass index is 21.22 kg/m.  Mental Status Per Nursing Assessment::   On Admission:  NA  Demographic Factors:  NA  Loss  Factors: NA  Historical Factors: NA  Risk Reduction Factors:   NA  Continued Clinical Symptoms:  Alcohol/Substance Abuse/Dependencies  Cognitive Features That Contribute To Risk:  Closed-mindedness    Suicide Risk:  Minimal: No identifiable suicidal ideation.  Patients presenting with no risk factors but with morbid ruminations; may be classified as minimal risk based on the severity of the depressive symptoms    Plan Of Care/Follow-up recommendations: See Sw Note   Sarina Ill, DO 05/01/2023, 11:00 AM

## 2023-05-01 NOTE — Plan of Care (Signed)
Patient discharged.

## 2023-05-02 ENCOUNTER — Encounter (HOSPITAL_COMMUNITY): Payer: Self-pay

## 2023-05-02 ENCOUNTER — Emergency Department (HOSPITAL_COMMUNITY)
Admission: EM | Admit: 2023-05-02 | Discharge: 2023-05-03 | Disposition: A | Discharge status: Home / Self Care | Payer: 59 | Attending: Emergency Medicine | Admitting: Emergency Medicine

## 2023-05-02 ENCOUNTER — Other Ambulatory Visit: Payer: Self-pay

## 2023-05-02 DIAGNOSIS — R4689 Other symptoms and signs involving appearance and behavior: Secondary | ICD-10-CM | POA: Diagnosis present

## 2023-05-02 DIAGNOSIS — E039 Hypothyroidism, unspecified: Secondary | ICD-10-CM | POA: Diagnosis not present

## 2023-05-02 DIAGNOSIS — R45851 Suicidal ideations: Secondary | ICD-10-CM | POA: Insufficient documentation

## 2023-05-02 DIAGNOSIS — Z7984 Long term (current) use of oral hypoglycemic drugs: Secondary | ICD-10-CM | POA: Diagnosis not present

## 2023-05-02 DIAGNOSIS — Z96641 Presence of right artificial hip joint: Secondary | ICD-10-CM | POA: Diagnosis not present

## 2023-05-02 DIAGNOSIS — F69 Unspecified disorder of adult personality and behavior: Secondary | ICD-10-CM

## 2023-05-02 DIAGNOSIS — E119 Type 2 diabetes mellitus without complications: Secondary | ICD-10-CM | POA: Diagnosis not present

## 2023-05-02 DIAGNOSIS — Z430 Encounter for attention to tracheostomy: Secondary | ICD-10-CM

## 2023-05-02 DIAGNOSIS — R456 Violent behavior: Secondary | ICD-10-CM | POA: Diagnosis present

## 2023-05-02 DIAGNOSIS — I10 Essential (primary) hypertension: Secondary | ICD-10-CM | POA: Insufficient documentation

## 2023-05-02 DIAGNOSIS — M461 Sacroiliitis, not elsewhere classified: Secondary | ICD-10-CM

## 2023-05-02 DIAGNOSIS — Z7951 Long term (current) use of inhaled steroids: Secondary | ICD-10-CM | POA: Insufficient documentation

## 2023-05-02 DIAGNOSIS — R000 Tachycardia, unspecified: Secondary | ICD-10-CM | POA: Diagnosis not present

## 2023-05-02 DIAGNOSIS — J449 Chronic obstructive pulmonary disease, unspecified: Secondary | ICD-10-CM | POA: Insufficient documentation

## 2023-05-02 DIAGNOSIS — F17210 Nicotine dependence, cigarettes, uncomplicated: Secondary | ICD-10-CM | POA: Insufficient documentation

## 2023-05-02 DIAGNOSIS — I4891 Unspecified atrial fibrillation: Secondary | ICD-10-CM | POA: Diagnosis not present

## 2023-05-02 DIAGNOSIS — E1142 Type 2 diabetes mellitus with diabetic polyneuropathy: Secondary | ICD-10-CM

## 2023-05-02 DIAGNOSIS — F22 Delusional disorders: Secondary | ICD-10-CM | POA: Insufficient documentation

## 2023-05-02 DIAGNOSIS — Z794 Long term (current) use of insulin: Secondary | ICD-10-CM

## 2023-05-02 DIAGNOSIS — Z79899 Other long term (current) drug therapy: Secondary | ICD-10-CM | POA: Diagnosis not present

## 2023-05-02 LAB — CBC WITH DIFFERENTIAL/PLATELET
Abs Immature Granulocytes: 0.01 10*3/uL (ref 0.00–0.07)
Basophils Absolute: 0 10*3/uL (ref 0.0–0.1)
Basophils Relative: 0 %
Eosinophils Absolute: 0 10*3/uL (ref 0.0–0.5)
Eosinophils Relative: 0 %
HCT: 29.3 % — ABNORMAL LOW (ref 36.0–46.0)
Hemoglobin: 8.1 g/dL — ABNORMAL LOW (ref 12.0–15.0)
Immature Granulocytes: 0 %
Lymphocytes Relative: 27 %
Lymphs Abs: 1.4 10*3/uL (ref 0.7–4.0)
MCH: 20.1 pg — ABNORMAL LOW (ref 26.0–34.0)
MCHC: 27.6 g/dL — ABNORMAL LOW (ref 30.0–36.0)
MCV: 72.9 fL — ABNORMAL LOW (ref 80.0–100.0)
Monocytes Absolute: 0.5 10*3/uL (ref 0.1–1.0)
Monocytes Relative: 9 %
Neutro Abs: 3.3 10*3/uL (ref 1.7–7.7)
Neutrophils Relative %: 64 %
Platelets: 249 10*3/uL (ref 150–400)
RBC: 4.02 MIL/uL (ref 3.87–5.11)
RDW: 19.5 % — ABNORMAL HIGH (ref 11.5–15.5)
WBC: 5.2 10*3/uL (ref 4.0–10.5)
nRBC: 0 % (ref 0.0–0.2)

## 2023-05-02 LAB — COMPREHENSIVE METABOLIC PANEL
ALT: 18 U/L (ref 0–44)
AST: 45 U/L — ABNORMAL HIGH (ref 15–41)
Albumin: 3.3 g/dL — ABNORMAL LOW (ref 3.5–5.0)
Alkaline Phosphatase: 74 U/L (ref 38–126)
Anion gap: 11 (ref 5–15)
BUN: 9 mg/dL (ref 6–20)
CO2: 18 mmol/L — ABNORMAL LOW (ref 22–32)
Calcium: 8.5 mg/dL — ABNORMAL LOW (ref 8.9–10.3)
Chloride: 110 mmol/L (ref 98–111)
Creatinine, Ser: 0.57 mg/dL (ref 0.44–1.00)
GFR, Estimated: >60 mL/min (ref >60)
Glucose, Bld: 98 mg/dL (ref 70–99)
Potassium: 4 mmol/L (ref 3.5–5.1)
Sodium: 139 mmol/L (ref 135–145)
Total Bilirubin: 0.3 mg/dL (ref <1.2)
Total Protein: 6.7 g/dL (ref 6.5–8.1)

## 2023-05-02 LAB — RAPID URINE DRUG SCREEN, HOSP PERFORMED
Amphetamines: NOT DETECTED
Barbiturates: NOT DETECTED
Benzodiazepines: NOT DETECTED
Cocaine: POSITIVE — AB
Opiates: NOT DETECTED
Tetrahydrocannabinol: NOT DETECTED

## 2023-05-02 LAB — ETHANOL: Alcohol, Ethyl (B): 139 mg/dL — ABNORMAL HIGH (ref <10)

## 2023-05-02 MED ORDER — NICOTINE POLACRILEX 2 MG MT GUM
2.0000 mg | CHEWING_GUM | OROMUCOSAL | Status: DC | PRN
  Administered 2023-05-02 – 2023-05-03 (×4): 2 mg via ORAL
  Filled 2023-05-02 (×4): qty 1

## 2023-05-02 MED ORDER — FAMOTIDINE 20 MG PO TABS
20.0000 mg | ORAL_TABLET | Freq: Every day | ORAL | Status: DC
  Administered 2023-05-02 – 2023-05-03 (×2): 20 mg via ORAL
  Filled 2023-05-02 (×2): qty 1

## 2023-05-02 MED ORDER — LORAZEPAM 2 MG/ML IJ SOLN
2.0000 mg | Freq: Once | INTRAMUSCULAR | Status: AC
  Administered 2023-05-02: 2 mg via INTRAMUSCULAR
  Filled 2023-05-02: qty 1

## 2023-05-02 MED ORDER — ZONISAMIDE 100 MG PO CAPS
200.0000 mg | ORAL_CAPSULE | Freq: Every day | ORAL | Status: DC
  Administered 2023-05-03: 200 mg via ORAL
  Filled 2023-05-02 (×2): qty 2

## 2023-05-02 MED ORDER — DIVALPROEX SODIUM 500 MG PO DR TAB
500.0000 mg | DELAYED_RELEASE_TABLET | Freq: Two times a day (BID) | ORAL | Status: DC
Start: 2023-05-02 — End: 2023-05-03
  Administered 2023-05-02 – 2023-05-03 (×3): 500 mg via ORAL
  Filled 2023-05-02 (×2): qty 1
  Filled 2023-05-02: qty 2

## 2023-05-02 MED ORDER — TIZANIDINE HCL 4 MG PO TABS
2.0000 mg | ORAL_TABLET | Freq: Every day | ORAL | Status: DC
  Administered 2023-05-02: 2 mg via ORAL
  Filled 2023-05-02: qty 1

## 2023-05-02 MED ORDER — ARIPIPRAZOLE 10 MG PO TABS
10.0000 mg | ORAL_TABLET | Freq: Every day | ORAL | Status: DC
  Administered 2023-05-03: 10 mg via ORAL
  Filled 2023-05-02: qty 1

## 2023-05-02 MED ORDER — NICOTINE 21 MG/24HR TD PT24
21.0000 mg | MEDICATED_PATCH | Freq: Once | TRANSDERMAL | Status: DC
  Administered 2023-05-02: 21 mg via TRANSDERMAL
  Filled 2023-05-02: qty 1

## 2023-05-02 MED ORDER — HYOSCYAMINE SULFATE 0.125 MG SL SUBL
0.1250 mg | SUBLINGUAL_TABLET | SUBLINGUAL | Status: DC | PRN
Start: 2023-05-02 — End: 2023-05-03
  Administered 2023-05-02 – 2023-05-03 (×2): 0.125 mg via SUBLINGUAL
  Filled 2023-05-02 (×2): qty 1

## 2023-05-02 MED ORDER — LORAZEPAM 1 MG PO TABS: 2.0000 mg | ORAL_TABLET | Freq: Once | ORAL | Status: AC

## 2023-05-02 MED ORDER — ZIPRASIDONE MESYLATE 20 MG IM SOLR
20.0000 mg | Freq: Once | INTRAMUSCULAR | Status: AC
  Administered 2023-05-02: 20 mg via INTRAMUSCULAR
  Filled 2023-05-02: qty 20

## 2023-05-02 MED ORDER — METFORMIN HCL ER 750 MG PO TB24
750.0000 mg | ORAL_TABLET | Freq: Every day | ORAL | Status: DC
  Filled 2023-05-02: qty 1

## 2023-05-02 MED ORDER — ALBUTEROL SULFATE HFA 108 (90 BASE) MCG/ACT IN AERS
1.0000 | INHALATION_SPRAY | Freq: Four times a day (QID) | RESPIRATORY_TRACT | Status: DC | PRN
  Administered 2023-05-03 (×2): 2 via RESPIRATORY_TRACT
  Filled 2023-05-02: qty 6.7

## 2023-05-02 MED ORDER — STERILE WATER FOR INJECTION IJ SOLN
INTRAMUSCULAR | Status: AC
  Administered 2023-05-02: 1 mL via INTRAMUSCULAR
  Filled 2023-05-02: qty 10

## 2023-05-02 MED ORDER — FUROSEMIDE 20 MG PO TABS
20.0000 mg | ORAL_TABLET | Freq: Every day | ORAL | Status: DC
  Administered 2023-05-02 – 2023-05-03 (×2): 20 mg via ORAL
  Filled 2023-05-02 (×2): qty 1

## 2023-05-02 NOTE — ED Notes (Signed)
Pt endorsing pain to right arm and leg; requesting something for same.  This RN explained that current PRN orders are for every 4 hours and it was currently too early.  Pt states last dose did not really help.  Prior RN had already spoke with provider about pt's PRN orders; no new orders currently.  This RN encouraged pt that we would try another dose when able to see if this helps.  This RN  further offered other methods of pain control such as ice packs or heating packs.  Pt politely declines stating that she would need "too many" packs to go over her arm and leg.  Pt pleasant at this time.  Pt does express interest in knowing why she hurts all the time; states she is thinking it might be arthritis.  This RN spoke with pt about possibly following up outpatient to try to establish regular care and mentioned that usually a resource sheet can be offered with a variety of clinics/offices that she could try to follow up with at a later time after her hospital stay.

## 2023-05-02 NOTE — ED Notes (Signed)
Pt refused temp at this time.
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
## 2023-05-02 NOTE — ED Notes (Signed)
Pt belongings in locker 5 

## 2023-05-02 NOTE — ED Notes (Signed)
Pt taken out of hand cuffs.

## 2023-05-02 NOTE — Progress Notes (Signed)
Patient has been denied by [facility] due to no appropriate beds available. Patient meets BH inpatient criteria per Sindy Guadeloupe, NP. Patient has been faxed out to the following facilities:   [facility]  [address] [place] [address] [phone]  [facility]  [address] [place] [address] [phone]  [facility]  [address] [place] [address] [phone]  [facility]  [address] [place] [address] [phone]  [facility]  [address] [place] [address] [phone]  [facility]  [address] [place] [address] [phone]  [facility]  [address] [place] [address] [phone]  [facility]  [address] [place], [place] [address] [phone]  [facility]  [address], [place] [address] [phone]  [facility]  [address] [place] [address] [phone]  [facility]  [address] [place] [address] [phone]  [facility]  [address] [place] [address] [phone]  [facility]  [address] [place] [address] [phone]   Damita Dunnings, MSW, LCSW-A  4:23 PM 05/02/2023
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
## 2023-05-02 NOTE — ED Notes (Signed)
PATIENT CAME IN IVC'D PAPERWORK SCANNED INTO CHART ORIGINAL COPY IN RED FOLDER 1 COPY IN MEDICAL RECORDS DRAWER IN ORANGE AND 3 COPIES ATTACHED TO CLIPBOARD IN BLUE ZONE EXP 05/08/23

## 2023-05-02 NOTE — ED Notes (Signed)
This RN notified Andria Meuse DO of pt's ongoing report of pain and inquired about trying pt's Tizanidine she was taking prior.  Order placed for night dose of Tizanidine.  Pt updated on plan to try Tizanidine and hold off on other PRN to see if Tizanidine helps.  Pt agreeable and further notified that if Tizanidine does not help, we could fall back on PRN.

## 2023-05-02 NOTE — ED Notes (Signed)
Pt received for care at 1900.  At this time, pt sitting in bed; respirations even and unlabored with no distress noted.  Bed in lowest position, wheels locked.  Pt in view of sitter with necessary precautions maintained.

## 2023-05-03 DIAGNOSIS — R4689 Other symptoms and signs involving appearance and behavior: Secondary | ICD-10-CM | POA: Diagnosis not present

## 2023-05-03 DIAGNOSIS — R45851 Suicidal ideations: Secondary | ICD-10-CM

## 2023-05-03 DIAGNOSIS — R456 Violent behavior: Secondary | ICD-10-CM | POA: Diagnosis not present

## 2023-05-03 MED ORDER — ACETAMINOPHEN 325 MG PO TABS
650.0000 mg | ORAL_TABLET | Freq: Once | ORAL | Status: AC
  Administered 2023-05-03: 650 mg via ORAL
  Filled 2023-05-03: qty 2

## 2023-05-03 NOTE — Discharge Instructions (Signed)
Please continue taking all of her medications.  Try to abstain from drugs and alcohol.

## 2023-05-03 NOTE — ED Provider Notes (Signed)
Emergency Medicine Observation Re-evaluation Note  [name] is a 57 y.o. female, seen on rounds today.  Pt initially presented to the ED for complaints of No chief complaint on file. Currently, the patient is calm, cooperative.  Excited for discharge.  Physical Exam  BP 133/76 (BP Location: Left Arm)   Pulse 99   Temp 98.1 F (36.7 C) (Oral)   Resp 18   LMP 01/08/2011   SpO2 96%  Physical Exam Vitals reviewed.  Constitutional:      Appearance: Normal appearance.  Cardiovascular:     Rate and Rhythm: Normal rate.  Pulmonary:     Effort: Pulmonary effort is normal.  Abdominal:     General: There is no distension.  Neurological:     General: No focal deficit present.     Mental Status: She is alert.  Psychiatric:        Mood and Affect: Mood normal.      ED Course / MDM  EKG:EKG Interpretation Date/Time:  Saturday May 02 2023 09:50:35 EST Ventricular Rate:  107 PR Interval:  139 QRS Duration:  91 QT Interval:  336 QTC Calculation: 449 R Axis:   39  Text Interpretation: Sinus tachycardia Probable left atrial enlargement Confirmed by Virgina White [phone]) on 05/02/2023 10:07:16 AM  I have reviewed the labs performed to date as well as medications administered while in observation.  Recent changes in the last 24 hours include being evaluated by psychiatry.  Psychiatry does not believe patient represents a clear threat to self.  Do believe that there is an element of substance use and alcohol abuse which is contributing to the patient's symptoms.  Psychiatry had spoken with the patient's daughter.  Patient denies any SI or HI to me.  Will discharge.  Plan  Current plan is for discharge.    Anders Simmonds T, DO 05/03/23 1213
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
## 2023-05-03 NOTE — ED Notes (Signed)
IVC RESCINDED AND SAVED INTO PATIENTS CHART AND IN SDRIVE NO CASE NUMBER GIVEN TO EFILE SINCE PATIENT CAME IN ALREADY IVC'D

## 2023-05-04 ENCOUNTER — Telehealth: Payer: Self-pay

## 2023-05-04 NOTE — Telephone Encounter (Signed)
..  Patient declines further follow up and engagement by the Managed Medicaid Team. Appropriate care team members and provider have been notified via electronic communication. The Managed Medicaid Team is available to follow up with the patient after provider conversation with the patient regarding recommendation for engagement and subsequent re-referral to the Managed Medicaid Team.     Jennifer Alley Care Guide  Medicaid Managed  Care Guide [place]  [phone]  

## 2023-05-05 ENCOUNTER — Emergency Department (HOSPITAL_COMMUNITY): Payer: 59

## 2023-05-05 ENCOUNTER — Emergency Department (HOSPITAL_COMMUNITY)
Admission: EM | Admit: 2023-05-05 | Discharge: 2023-05-05 | Disposition: A | Discharge status: Home / Self Care | Payer: 59 | Attending: Emergency Medicine | Admitting: Emergency Medicine

## 2023-05-05 ENCOUNTER — Encounter (HOSPITAL_COMMUNITY): Payer: Self-pay

## 2023-05-05 ENCOUNTER — Other Ambulatory Visit: Payer: Self-pay

## 2023-05-05 DIAGNOSIS — I4891 Unspecified atrial fibrillation: Secondary | ICD-10-CM | POA: Diagnosis not present

## 2023-05-05 DIAGNOSIS — Z91148 Patient's other noncompliance with medication regimen for other reason: Secondary | ICD-10-CM | POA: Insufficient documentation

## 2023-05-05 DIAGNOSIS — W010XXA Fall on same level from slipping, tripping and stumbling without subsequent striking against object, initial encounter: Secondary | ICD-10-CM | POA: Insufficient documentation

## 2023-05-05 DIAGNOSIS — S0083XA Contusion of other part of head, initial encounter: Secondary | ICD-10-CM | POA: Insufficient documentation

## 2023-05-05 DIAGNOSIS — R55 Syncope and collapse: Secondary | ICD-10-CM | POA: Diagnosis not present

## 2023-05-05 DIAGNOSIS — R609 Edema, unspecified: Secondary | ICD-10-CM | POA: Diagnosis not present

## 2023-05-05 DIAGNOSIS — W19XXXA Unspecified fall, initial encounter: Secondary | ICD-10-CM

## 2023-05-05 DIAGNOSIS — Y9301 Activity, walking, marching and hiking: Secondary | ICD-10-CM | POA: Insufficient documentation

## 2023-05-05 DIAGNOSIS — Z7951 Long term (current) use of inhaled steroids: Secondary | ICD-10-CM | POA: Insufficient documentation

## 2023-05-05 DIAGNOSIS — T148XXA Other injury of unspecified body region, initial encounter: Secondary | ICD-10-CM

## 2023-05-05 DIAGNOSIS — J449 Chronic obstructive pulmonary disease, unspecified: Secondary | ICD-10-CM | POA: Insufficient documentation

## 2023-05-05 DIAGNOSIS — Z7984 Long term (current) use of oral hypoglycemic drugs: Secondary | ICD-10-CM | POA: Insufficient documentation

## 2023-05-05 DIAGNOSIS — S0081XA Abrasion of other part of head, initial encounter: Secondary | ICD-10-CM | POA: Insufficient documentation

## 2023-05-05 DIAGNOSIS — S299XXA Unspecified injury of thorax, initial encounter: Secondary | ICD-10-CM | POA: Diagnosis not present

## 2023-05-05 DIAGNOSIS — G40909 Epilepsy, unspecified, not intractable, without status epilepticus: Secondary | ICD-10-CM | POA: Diagnosis not present

## 2023-05-05 DIAGNOSIS — Z043 Encounter for examination and observation following other accident: Secondary | ICD-10-CM | POA: Diagnosis not present

## 2023-05-05 DIAGNOSIS — Z79899 Other long term (current) drug therapy: Secondary | ICD-10-CM | POA: Insufficient documentation

## 2023-05-05 DIAGNOSIS — Z743 Need for continuous supervision: Secondary | ICD-10-CM | POA: Diagnosis not present

## 2023-05-05 DIAGNOSIS — J9811 Atelectasis: Secondary | ICD-10-CM | POA: Diagnosis not present

## 2023-05-05 DIAGNOSIS — R000 Tachycardia, unspecified: Secondary | ICD-10-CM | POA: Diagnosis not present

## 2023-05-05 DIAGNOSIS — S0993XA Unspecified injury of face, initial encounter: Secondary | ICD-10-CM | POA: Diagnosis present

## 2023-05-05 LAB — COMPREHENSIVE METABOLIC PANEL
ALT: 23 U/L (ref 0–44)
AST: 55 U/L — ABNORMAL HIGH (ref 15–41)
Albumin: 3.6 g/dL (ref 3.5–5.0)
Alkaline Phosphatase: 80 U/L (ref 38–126)
Anion gap: 15 (ref 5–15)
BUN: <5 mg/dL — ABNORMAL LOW (ref 6–20)
CO2: 18 mmol/L — ABNORMAL LOW (ref 22–32)
Calcium: 9 mg/dL (ref 8.9–10.3)
Chloride: 98 mmol/L (ref 98–111)
Creatinine, Ser: 0.39 mg/dL — ABNORMAL LOW (ref 0.44–1.00)
GFR, Estimated: >60 mL/min (ref >60)
Glucose, Bld: 108 mg/dL — ABNORMAL HIGH (ref 70–99)
Potassium: 3.9 mmol/L (ref 3.5–5.1)
Sodium: 131 mmol/L — ABNORMAL LOW (ref 135–145)
Total Bilirubin: 0.3 mg/dL (ref <1.2)
Total Protein: 7.2 g/dL (ref 6.5–8.1)

## 2023-05-05 LAB — CBC
HCT: 32 % — ABNORMAL LOW (ref 36.0–46.0)
Hemoglobin: 9 g/dL — ABNORMAL LOW (ref 12.0–15.0)
MCH: 20.3 pg — ABNORMAL LOW (ref 26.0–34.0)
MCHC: 28.1 g/dL — ABNORMAL LOW (ref 30.0–36.0)
MCV: 72.2 fL — ABNORMAL LOW (ref 80.0–100.0)
Platelets: 281 10*3/uL (ref 150–400)
RBC: 4.43 MIL/uL (ref 3.87–5.11)
RDW: 19 % — ABNORMAL HIGH (ref 11.5–15.5)
WBC: 10.2 10*3/uL (ref 4.0–10.5)
nRBC: 0 % (ref 0.0–0.2)

## 2023-05-05 LAB — URINALYSIS, ROUTINE W REFLEX MICROSCOPIC
Bacteria, UA: NONE SEEN
Bilirubin Urine: NEGATIVE
Glucose, UA: NEGATIVE mg/dL
Hgb urine dipstick: NEGATIVE
Ketones, ur: NEGATIVE mg/dL
Leukocytes,Ua: NEGATIVE
Nitrite: NEGATIVE
Protein, ur: 100 mg/dL — AB
Specific Gravity, Urine: 1.014 (ref 1.005–1.030)
pH: 5 (ref 5.0–8.0)

## 2023-05-05 LAB — CBG MONITORING, ED: Glucose-Capillary: 120 mg/dL — ABNORMAL HIGH (ref 70–99)

## 2023-05-05 LAB — RAPID URINE DRUG SCREEN, HOSP PERFORMED
Amphetamines: NOT DETECTED
Barbiturates: NOT DETECTED
Benzodiazepines: NOT DETECTED
Cocaine: NOT DETECTED
Opiates: NOT DETECTED
Tetrahydrocannabinol: NOT DETECTED

## 2023-05-05 LAB — ETHANOL: Alcohol, Ethyl (B): 34 mg/dL — ABNORMAL HIGH (ref <10)

## 2023-05-05 MED ORDER — BACITRACIN ZINC 500 UNIT/GM EX OINT
TOPICAL_OINTMENT | Freq: Two times a day (BID) | CUTANEOUS | Status: DC
  Filled 2023-05-05: qty 1.8

## 2023-05-05 MED ORDER — ACETAMINOPHEN 325 MG PO TABS
650.0000 mg | ORAL_TABLET | Freq: Once | ORAL | Status: AC
  Administered 2023-05-05: 650 mg via ORAL
  Filled 2023-05-05: qty 2

## 2023-05-05 NOTE — Discharge Instructions (Signed)
It was a pleasure taking care of you here in the emergency department today I would recommend taking the medications are previously prescribed to you  Return for new or worsening symptoms May take Tylenol Motrin as needed for pain

## 2023-05-05 NOTE — ED Notes (Signed)
Pt unable to provide urine sample at this time 
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
## 2023-05-05 NOTE — ED Provider Notes (Signed)
[place] EMERGENCY DEPARTMENT AT [facility] Provider Note   CSN: [id number] Arrival date & time: 05/05/23  1234    History  Fall   [name] is a 57 y.o. female with hx of afib, COPD, seizure disorder, polysubstance use here for evaluation of fall.  Patient states she was walking and she is unsure if she tripped and fell or if she had a seizure however she landed on the ground.  She denies any bowel or bladder incontinence, saddle paresthesia.  She denies any headache, numbness, weakness, chest pain, shortness of breath, pain or swelling to lower extremities.  She states she has not been taking her home medications.  She states she was feeling otherwise well prior to this.  Her only complaint is pain where her abrasions are to her face.  She denies any epistaxis.  HPI     Home Medications Prior to Admission medications   Medication Sig Start Date End Date Taking? Authorizing Provider  albuterol (VENTOLIN HFA) 108 (90 Base) MCG/ACT inhaler Inhale 1-2 puffs into the lungs every 6 (six) hours as needed for wheezing or shortness of breath. 04/01/23   Lonell Grandchild, MD  ARIPiprazole (ABILIFY) 10 MG tablet Take 1 tablet (10 mg total) by mouth daily. Patient not taking: Reported on 05/02/2023 05/02/23   Sarina Ill, DO  budesonide-formoterol [facility]) 160-4.5 MCG/ACT inhaler Inhale 2 puffs into the lungs 2 (two) times daily. 02/25/23   Sabas Sous, MD  cyanocobalamin (VITAMIN B12) 1000 MCG tablet Take 1 tablet (1,000 mcg total) by mouth daily. Patient not taking: Reported on 05/02/2023 03/16/23 07/24/23  Zigmund Daniel., MD  divalproex (DEPAKOTE) 500 MG DR tablet Take 1 tablet (500 mg total) by mouth every 12 (twelve) hours. Patient not taking: Reported on 05/02/2023 05/01/23   Sarina Ill, DO  famotidine (PEPCID) 20 MG tablet Take 1 tablet (20 mg total) by mouth daily. Patient not taking: Reported on 05/02/2023 02/25/23   Sabas Sous, MD   ferrous sulfate 325 (65 FE) MG tablet Take 1 tablet (325 mg total) by mouth every other day. Patient not taking: Reported on 05/02/2023 03/18/23 10/02/23  Zigmund Daniel., MD  fluticasone-salmeterol (ADVAIR) 250-50 MCG/ACT AEPB Inhale 1 puff into the lungs in the morning and at bedtime.    [provider]  folic acid (FOLVITE) 1 MG tablet Take 1 tablet (1 mg total) by mouth daily. Patient not taking: Reported on 05/02/2023 03/17/23 05/16/23  Zigmund Daniel., MD  furosemide (LASIX) 20 MG tablet Take 1 tablet (20 mg total) by mouth daily. Patient not taking: Reported on 05/02/2023 02/24/23   Achille Rich, PA-C  gabapentin (NEURONTIN) 300 MG capsule Take 1 capsule (300 mg total) by mouth 3 (three) times daily. Patient not taking: Reported on 05/02/2023 05/01/23   Sarina Ill, DO  hyoscyamine (LEVSIN/SL) 0.125 MG SL tablet Place 1 tablet (0.125 mg total) under the tongue every 4 (four) hours as needed (pain). Up to 1.25 mg daily Patient not taking: Reported on 05/02/2023 01/23/23   Arthor Captain, PA-C  ipratropium-albuterol (DUONEB) 0.5-2.5 (3) MG/3ML SOLN Take 3 mLs by nebulization every 6 (six) hours as needed (for shortness of breath or wheezing).    [provider]  levETIRAcetam (KEPPRA XR) 500 MG 24 hr tablet Take 1 tablet (500 mg total) by mouth 2 (two) times daily. Patient not taking: Reported on 05/02/2023 05/01/23   Sarina Ill, DO  metFORMIN (GLUCOPHAGE-XR) 750 MG 24  hr tablet Take 750 mg by mouth daily. Patient not taking: Reported on 05/02/2023 03/23/23   [provider]  ondansetron (ZOFRAN) 4 MG tablet Take 1 tablet (4 mg total) by mouth every 6 (six) hours. Patient not taking: Reported on 05/02/2023 01/20/23   Prosperi, Christian H, PA-C  ondansetron (ZOFRAN-ODT) 4 MG disintegrating tablet Take 1 tablet (4 mg total) by mouth every 8 (eight) hours as needed for nausea or vomiting. Patient not taking: Reported on 05/02/2023 03/01/23    Laurence Spates, MD  OXYGEN Inhale 4-5 L/min into the lungs as needed (for COPD-related symptoms).    [provider]  pantoprazole (PROTONIX) 40 MG tablet Take 1 tablet (40 mg total) by mouth daily. Patient not taking: Reported on 05/02/2023 03/17/23 05/02/23  Zigmund Daniel., MD  predniSONE (DELTASONE) 20 MG tablet Take 2 tablets (40 mg total) by mouth daily with breakfast. Patient not taking: Reported on 05/02/2023 04/16/23   Gilda Crease, MD  sucralfate (CARAFATE) 1 g tablet Take 1 tablet (1 g total) by mouth 4 (four) times daily -  with meals and at bedtime. Patient not taking: Reported on 05/02/2023 02/11/23   Horton, Mayer Masker, MD  tiZANidine (ZANAFLEX) 2 MG tablet Take 2 mg by mouth at bedtime. Patient not taking: Reported on 05/02/2023 02/19/23   [provider]  zonisamide (ZONEGRAN) 100 MG capsule Take 2 capsules (200 mg total) by mouth daily. Patient not taking: Reported on 05/02/2023 01/12/23   Lewie Chamber, MD      Allergies    Iron dextran, Aspirin, and Penicillins    Review of Systems   Review of Systems  Constitutional: Negative.   HENT: Negative.    Respiratory: Negative.    Cardiovascular: Negative.   Gastrointestinal: Negative.   Genitourinary: Negative.   Musculoskeletal: Negative.   Skin:  Positive for wound.  Neurological: Negative.   All other systems reviewed and are negative.   Physical Exam Updated Vital Signs BP 118/71   Pulse 99   Temp 97.8 F (36.6 C) (Oral)   Resp 19   LMP 01/08/2011   SpO2 99%  Physical Exam Vitals and nursing note reviewed.  Constitutional:      General: She is not in acute distress.    Appearance: She is well-developed. She is not ill-appearing, toxic-appearing or diaphoretic.  HENT:     Head: Normocephalic and atraumatic.     Nose: Nose normal.     Mouth/Throat:     Mouth: Mucous membranes are moist.  Eyes:     Pupils: Pupils are equal, round, and reactive to light.  Cardiovascular:      Rate and Rhythm: Normal rate.     Pulses: Normal pulses.          Radial pulses are 2+ on the right side and 2+ on the left side.       Dorsalis pedis pulses are 2+ on the right side and 2+ on the left side.     Heart sounds: Normal heart sounds.  Pulmonary:     Effort: Pulmonary effort is normal. No respiratory distress.     Breath sounds: Normal breath sounds.  Abdominal:     General: Bowel sounds are normal. There is no distension.     Palpations: Abdomen is soft.     Tenderness: There is no abdominal tenderness. There is no guarding or rebound.     Hernia: No hernia is present.  Musculoskeletal:        General: Signs  of injury present. No swelling, tenderness or deformity. Normal range of motion.     Cervical back: Normal range of motion.     Comments: No bony tenderness, compartment soft  Skin:    General: Skin is warm and dry.     Capillary Refill: Capillary refill takes less than 2 seconds.     Comments: Abrasion left face, right hand  Neurological:     General: No focal deficit present.     Mental Status: She is alert and oriented to person, place, and time.     Cranial Nerves: No cranial nerve deficit.     Sensory: No sensory deficit.     Motor: No weakness.     Coordination: Coordination normal.     Gait: Gait normal.  Psychiatric:        Mood and Affect: Mood normal.     ED Results / Procedures / Treatments   Labs (all labs ordered are listed, but only abnormal results are displayed) Labs Reviewed  COMPREHENSIVE METABOLIC PANEL - Abnormal; Notable for the following components:      Result Value   Sodium 131 (*)    CO2 18 (*)    Glucose, Bld 108 (*)    BUN <5 (*)    Creatinine, Ser 0.39 (*)    AST 55 (*)    All other components within normal limits  CBC - Abnormal; Notable for the following components:   Hemoglobin 9.0 (*)    HCT 32.0 (*)    MCV 72.2 (*)    MCH 20.3 (*)    MCHC 28.1 (*)    RDW 19.0 (*)    All other components within normal limits   ETHANOL - Abnormal; Notable for the following components:   Alcohol, Ethyl (B) 34 (*)    All other components within normal limits  URINALYSIS, ROUTINE W REFLEX MICROSCOPIC - Abnormal; Notable for the following components:   Protein, ur 100 (*)    All other components within normal limits  CBG MONITORING, ED - Abnormal; Notable for the following components:   Glucose-Capillary 120 (*)    All other components within normal limits  RAPID URINE DRUG SCREEN, HOSP PERFORMED    EKG EKG Interpretation Date/Time:  Tuesday May 05 2023 12:57:10 EST Ventricular Rate:  111 PR Interval:  142 QRS Duration:  84 QT Interval:  324 QTC Calculation: 440 R Axis:   23  Text Interpretation: Sinus tachycardia Possible Left atrial enlargement Borderline ECG Confirmed by Ernie Avena ([id number]) on 05/05/2023 1:00:08 PM  Radiology DG Chest 1 View  Result Date: 05/05/2023 CLINICAL DATA:  [id number] Trauma [id number]. EXAM: CHEST  1 VIEW COMPARISON:  Chest radiograph 04/17/2023. FINDINGS: Bandlike atelectasis in the left lower lobe. No consolidation or pulmonary edema. Normal heart size and mediastinal contours. No pleural effusion or pneumothorax. No displaced rib fracture. IMPRESSION: Bandlike atelectasis in the left lower lobe. No consolidation or pulmonary edema. Electronically Signed   By: Orvan Falconer M.D.   On: 05/05/2023 16:52   CT HEAD WO CONTRAST  Result Date: 05/05/2023 CLINICAL DATA:  Fall, abrasions to face, ETOH EXAM: CT HEAD WITHOUT CONTRAST CT MAXILLOFACIAL WITHOUT CONTRAST CT CERVICAL SPINE WITHOUT CONTRAST TECHNIQUE: Multidetector CT imaging of the head, cervical spine, and maxillofacial structures were performed using the standard protocol without intravenous contrast. Multiplanar CT image reconstructions of the cervical spine and maxillofacial structures were also generated. RADIATION DOSE REDUCTION: This exam was performed according to the departmental dose-optimization program which  includes automated exposure control,  adjustment of the mA and/or kV according to patient size and/or use of iterative reconstruction technique. COMPARISON:  None Available. FINDINGS: CT HEAD FINDINGS Brain: No evidence of acute infarction, hemorrhage, hydrocephalus, extra-axial collection or mass lesion/mass effect. Vascular: No hyperdense vessel or unexpected calcification. CT FACIAL BONES FINDINGS Skull: Normal. Negative for fracture or focal lesion. Facial bones: No displaced fractures or dislocations. Sinuses/Orbits: No acute finding. Other: Partially imaged defect of the nasal septum (series 4, image 1). Soft tissue contusion of the chin (series 3, image 16). CT CERVICAL SPINE FINDINGS Alignment: Normal. Skull base and vertebrae: No acute fracture. No primary bone lesion or focal pathologic process. Soft tissues and spinal canal: No prevertebral fluid or swelling. No visible canal hematoma. Disc levels: Mild multilevel disc space height loss and osteophytosis. Upper chest: Negative. Other: None. IMPRESSION: 1. No acute intracranial pathology. 2. No displaced fractures or dislocations of the facial bones. 3. Soft tissue contusion of the chin. 4. No fracture or static subluxation of the cervical spine. 5. Mild multilevel cervical disc degenerative disease. Electronically Signed   By: Jearld Lesch M.D.   On: 05/05/2023 14:35   CT MAXILLOFACIAL WO CONTRAST  Result Date: 05/05/2023 CLINICAL DATA:  Fall, abrasions to face, ETOH EXAM: CT HEAD WITHOUT CONTRAST CT MAXILLOFACIAL WITHOUT CONTRAST CT CERVICAL SPINE WITHOUT CONTRAST TECHNIQUE: Multidetector CT imaging of the head, cervical spine, and maxillofacial structures were performed using the standard protocol without intravenous contrast. Multiplanar CT image reconstructions of the cervical spine and maxillofacial structures were also generated. RADIATION DOSE REDUCTION: This exam was performed according to the departmental dose-optimization program which  includes automated exposure control, adjustment of the mA and/or kV according to patient size and/or use of iterative reconstruction technique. COMPARISON:  None Available. FINDINGS: CT HEAD FINDINGS Brain: No evidence of acute infarction, hemorrhage, hydrocephalus, extra-axial collection or mass lesion/mass effect. Vascular: No hyperdense vessel or unexpected calcification. CT FACIAL BONES FINDINGS Skull: Normal. Negative for fracture or focal lesion. Facial bones: No displaced fractures or dislocations. Sinuses/Orbits: No acute finding. Other: Partially imaged defect of the nasal septum (series 4, image 1). Soft tissue contusion of the chin (series 3, image 16). CT CERVICAL SPINE FINDINGS Alignment: Normal. Skull base and vertebrae: No acute fracture. No primary bone lesion or focal pathologic process. Soft tissues and spinal canal: No prevertebral fluid or swelling. No visible canal hematoma. Disc levels: Mild multilevel disc space height loss and osteophytosis. Upper chest: Negative. Other: None. IMPRESSION: 1. No acute intracranial pathology. 2. No displaced fractures or dislocations of the facial bones. 3. Soft tissue contusion of the chin. 4. No fracture or static subluxation of the cervical spine. 5. Mild multilevel cervical disc degenerative disease. Electronically Signed   By: Jearld Lesch M.D.   On: 05/05/2023 14:35   CT CERVICAL SPINE WO CONTRAST  Result Date: 05/05/2023 CLINICAL DATA:  Fall, abrasions to face, ETOH EXAM: CT HEAD WITHOUT CONTRAST CT MAXILLOFACIAL WITHOUT CONTRAST CT CERVICAL SPINE WITHOUT CONTRAST TECHNIQUE: Multidetector CT imaging of the head, cervical spine, and maxillofacial structures were performed using the standard protocol without intravenous contrast. Multiplanar CT image reconstructions of the cervical spine and maxillofacial structures were also generated. RADIATION DOSE REDUCTION: This exam was performed according to the departmental dose-optimization program which  includes automated exposure control, adjustment of the mA and/or kV according to patient size and/or use of iterative reconstruction technique. COMPARISON:  None Available. FINDINGS: CT HEAD FINDINGS Brain: No evidence of acute infarction, hemorrhage, hydrocephalus, extra-axial collection or mass lesion/mass effect. Vascular:  No hyperdense vessel or unexpected calcification. CT FACIAL BONES FINDINGS Skull: Normal. Negative for fracture or focal lesion. Facial bones: No displaced fractures or dislocations. Sinuses/Orbits: No acute finding. Other: Partially imaged defect of the nasal septum (series 4, image 1). Soft tissue contusion of the chin (series 3, image 16). CT CERVICAL SPINE FINDINGS Alignment: Normal. Skull base and vertebrae: No acute fracture. No primary bone lesion or focal pathologic process. Soft tissues and spinal canal: No prevertebral fluid or swelling. No visible canal hematoma. Disc levels: Mild multilevel disc space height loss and osteophytosis. Upper chest: Negative. Other: None. IMPRESSION: 1. No acute intracranial pathology. 2. No displaced fractures or dislocations of the facial bones. 3. Soft tissue contusion of the chin. 4. No fracture or static subluxation of the cervical spine. 5. Mild multilevel cervical disc degenerative disease. Electronically Signed   By: Jearld Lesch M.D.   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  Patient states she was walking and she is unsure if she tripped and fell or had a seizure which she does have history of according to patient and woke up with EMS around her.  She denies any current complaints.  She is nonfocal neuroexam  without deficits.  Patient calm, cooperative.  She does have abrasions to her left face, right hand.  She has no midline C/T/L tenderness.  No bony tenderness bilateral lower extremities.  Will plan on labs, imaging and reassess  Labs and imaging personally viewed and interpreted:  CBC no leukocytosis hemoglobin 9.0, similar to prior Metabolic panel sodium 131 Ethanol 34 UA negative for infection UDS negative CT head, max face, cervical without significant abnormality Chest x-ray with atelectasis left lower lobe EKG without ischemic changes  Patient's wounds were cleaned.  She has no bony tenderness to her right hand.  She said her tetanus is up-to-date.  I offered IV Keppra given possible seizure like activity per patient however she declines as she does not have an IV and does not want additional needlesticks.  I offered oral of her seizure medication however she declined.  On reassessment patient states she wants to go home.  She was observed here in the emergency department for most 5 hours without significant abnormality  At this time I low suspicion for acute ACS, PE, dissection, CVA, unstable angina, arrhythmia, bacterial process, status epilepticus, withdrawal, electrolyte abnormality as cause of her symptoms  Will have her follow-up outpatient, return for any worsening symptoms.  The patient has been appropriately medically screened and/or stabilized in the ED. I have low suspicion for any other emergent medical condition which would require further screening, evaluation or treatment in the ED or require inpatient management.  Patient is hemodynamically stable and in no acute distress.  Patient able to ambulate in department prior to ED.  Evaluation does not show acute pathology that would require ongoing or additional emergent interventions while in the emergency department or further inpatient treatment.  I have discussed the diagnosis with the patient and answered all questions.   Pain is been managed while in the emergency department and patient has no further complaints prior to discharge.  Patient is comfortable with plan discussed in  room and is stable for discharge at this time.  I have discussed strict return precautions for returning to the emergency department.  Patient was encouraged to follow-up with PCP/specialist refer to at discharge.                                 Medical Decision Making Amount and/or Complexity of Data Reviewed Independent Historian: EMS External Data Reviewed: labs, radiology, ECG and notes. Labs: ordered. Decision-making details documented in ED Course. Radiology: ordered and independent interpretation performed. Decision-making details documented in ED Course. ECG/medicine tests: ordered and independent interpretation performed. Decision-making details documented in ED Course.  Risk OTC drugs. Prescription drug management. Parenteral controlled substances. Decision regarding hospitalization. Diagnosis or treatment significantly limited by social determinants of health.         Final Clinical Impression(s) / ED Diagnoses Final diagnoses:  Fall, initial encounter  Abrasion    Rx / DC Orders ED Discharge Orders     None         Zeplin Aleshire A, PA-C 05/05/23 2039    Lorre Nick, MD 05/08/23 1202

## 2023-05-05 NOTE — ED Notes (Signed)
Bacitracin applied to pt face and right hand

## 2023-05-08 DIAGNOSIS — Z0289 Encounter for other administrative examinations: Secondary | ICD-10-CM | POA: Diagnosis not present

## 2023-05-08 DIAGNOSIS — J449 Chronic obstructive pulmonary disease, unspecified: Secondary | ICD-10-CM | POA: Diagnosis not present

## 2023-05-08 DIAGNOSIS — Z09 Encounter for follow-up examination after completed treatment for conditions other than malignant neoplasm: Secondary | ICD-10-CM | POA: Diagnosis not present

## 2023-05-08 DIAGNOSIS — I10 Essential (primary) hypertension: Secondary | ICD-10-CM | POA: Diagnosis not present

## 2023-05-08 DIAGNOSIS — S0081XA Abrasion of other part of head, initial encounter: Secondary | ICD-10-CM | POA: Diagnosis not present

## 2023-05-08 DIAGNOSIS — E1142 Type 2 diabetes mellitus with diabetic polyneuropathy: Secondary | ICD-10-CM | POA: Diagnosis not present

## 2023-05-14 DIAGNOSIS — J449 Chronic obstructive pulmonary disease, unspecified: Secondary | ICD-10-CM | POA: Diagnosis not present

## 2023-05-20 ENCOUNTER — Emergency Department (HOSPITAL_COMMUNITY)
Admission: EM | Admit: 2023-05-20 | Discharge: 2023-05-21 | Disposition: A | Discharge status: Home / Self Care | Payer: 59 | Attending: Emergency Medicine | Admitting: Emergency Medicine

## 2023-05-20 DIAGNOSIS — Y902 Blood alcohol level of 40-59 mg/100 ml: Secondary | ICD-10-CM | POA: Diagnosis not present

## 2023-05-20 DIAGNOSIS — F1020 Alcohol dependence, uncomplicated: Secondary | ICD-10-CM | POA: Diagnosis not present

## 2023-05-20 DIAGNOSIS — R0602 Shortness of breath: Secondary | ICD-10-CM | POA: Diagnosis not present

## 2023-05-20 DIAGNOSIS — F1914 Other psychoactive substance abuse with psychoactive substance-induced mood disorder: Secondary | ICD-10-CM | POA: Insufficient documentation

## 2023-05-20 DIAGNOSIS — F1994 Other psychoactive substance use, unspecified with psychoactive substance-induced mood disorder: Secondary | ICD-10-CM | POA: Diagnosis present

## 2023-05-20 DIAGNOSIS — R451 Restlessness and agitation: Secondary | ICD-10-CM | POA: Insufficient documentation

## 2023-05-20 DIAGNOSIS — F250 Schizoaffective disorder, bipolar type: Secondary | ICD-10-CM | POA: Diagnosis not present

## 2023-05-20 DIAGNOSIS — F259 Schizoaffective disorder, unspecified: Secondary | ICD-10-CM | POA: Diagnosis not present

## 2023-05-20 DIAGNOSIS — R456 Violent behavior: Secondary | ICD-10-CM | POA: Insufficient documentation

## 2023-05-20 DIAGNOSIS — F10129 Alcohol abuse with intoxication, unspecified: Secondary | ICD-10-CM | POA: Diagnosis present

## 2023-05-20 DIAGNOSIS — F10229 Alcohol dependence with intoxication, unspecified: Secondary | ICD-10-CM | POA: Diagnosis not present

## 2023-05-20 DIAGNOSIS — R45850 Homicidal ideations: Secondary | ICD-10-CM | POA: Diagnosis not present

## 2023-05-20 MED ORDER — STERILE WATER FOR INJECTION IJ SOLN
INTRAMUSCULAR | Status: AC
  Administered 2023-05-20: 1.2 mL
  Filled 2023-05-20: qty 10

## 2023-05-20 MED ORDER — ZIPRASIDONE MESYLATE 20 MG IM SOLR
20.0000 mg | Freq: Once | INTRAMUSCULAR | Status: AC
Start: 2023-05-20 — End: 2023-05-20
  Administered 2023-05-20: 20 mg via INTRAMUSCULAR
  Filled 2023-05-20: qty 20

## 2023-05-20 MED ORDER — DIPHENHYDRAMINE HCL 50 MG/ML IJ SOLN
50.0000 mg | Freq: Once | INTRAMUSCULAR | Status: AC
  Administered 2023-05-20: 50 mg via INTRAMUSCULAR
  Filled 2023-05-20: qty 1

## 2023-05-20 MED ORDER — LORAZEPAM 2 MG/ML IJ SOLN
2.0000 mg | Freq: Once | INTRAMUSCULAR | Status: AC
  Administered 2023-05-20: 2 mg via INTRAMUSCULAR
  Filled 2023-05-20: qty 1
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
## 2023-05-20 NOTE — ED Notes (Signed)
Dr Doran Durand at bedside due to pt's elevating agitation and refusal for treatment despite IVC. Pt is spitting in floor and yelling "nobody better touch me". [employer] remains at bedside. Meds ordered.

## 2023-05-20 NOTE — ED Notes (Signed)
IVC: [employer] # [id number] ORIGINAL PLACED IN RED FOLDER 3 COPIES ON CLIPBOARD IN ORANGE
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
## 2023-05-20 NOTE — ED Notes (Signed)
Pt taken to [place] [facility]. Security and [employer] remains at bedside. Pt refusing to get out of wheelchair onto stretcher at this time.

## 2023-05-21 ENCOUNTER — Other Ambulatory Visit: Payer: Self-pay

## 2023-05-21 ENCOUNTER — Encounter (HOSPITAL_COMMUNITY): Payer: Self-pay

## 2023-05-21 DIAGNOSIS — R456 Violent behavior: Secondary | ICD-10-CM | POA: Diagnosis not present

## 2023-05-21 DIAGNOSIS — F1994 Other psychoactive substance use, unspecified with psychoactive substance-induced mood disorder: Secondary | ICD-10-CM

## 2023-05-21 DIAGNOSIS — F250 Schizoaffective disorder, bipolar type: Secondary | ICD-10-CM

## 2023-05-21 DIAGNOSIS — F10229 Alcohol dependence with intoxication, unspecified: Secondary | ICD-10-CM

## 2023-05-21 DIAGNOSIS — R45850 Homicidal ideations: Secondary | ICD-10-CM

## 2023-05-21 LAB — CBC WITH DIFFERENTIAL/PLATELET
Abs Immature Granulocytes: 0.01 10*3/uL (ref 0.00–0.07)
Basophils Absolute: 0 10*3/uL (ref 0.0–0.1)
Basophils Relative: 1 %
Eosinophils Absolute: 0.1 10*3/uL (ref 0.0–0.5)
Eosinophils Relative: 2 %
HCT: 34.4 % — ABNORMAL LOW (ref 36.0–46.0)
Hemoglobin: 9.6 g/dL — ABNORMAL LOW (ref 12.0–15.0)
Immature Granulocytes: 0 %
Lymphocytes Relative: 31 %
Lymphs Abs: 1.4 10*3/uL (ref 0.7–4.0)
MCH: 20.3 pg — ABNORMAL LOW (ref 26.0–34.0)
MCHC: 27.9 g/dL — ABNORMAL LOW (ref 30.0–36.0)
MCV: 72.9 fL — ABNORMAL LOW (ref 80.0–100.0)
Monocytes Absolute: 0.4 10*3/uL (ref 0.1–1.0)
Monocytes Relative: 9 %
Neutro Abs: 2.7 10*3/uL (ref 1.7–7.7)
Neutrophils Relative %: 57 %
Platelets: 374 10*3/uL (ref 150–400)
RBC: 4.72 MIL/uL (ref 3.87–5.11)
RDW: 20.1 % — ABNORMAL HIGH (ref 11.5–15.5)
WBC: 4.7 10*3/uL (ref 4.0–10.5)
nRBC: 0 % (ref 0.0–0.2)

## 2023-05-21 LAB — AMMONIA: Ammonia: 24 umol/L (ref 9–35)

## 2023-05-21 LAB — COMPREHENSIVE METABOLIC PANEL
ALT: 13 U/L (ref 0–44)
AST: 20 U/L (ref 15–41)
Albumin: 3.1 g/dL — ABNORMAL LOW (ref 3.5–5.0)
Alkaline Phosphatase: 106 U/L (ref 38–126)
Anion gap: 11 (ref 5–15)
BUN: <5 mg/dL — ABNORMAL LOW (ref 6–20)
CO2: 22 mmol/L (ref 22–32)
Calcium: 8.8 mg/dL — ABNORMAL LOW (ref 8.9–10.3)
Chloride: 106 mmol/L (ref 98–111)
Creatinine, Ser: 0.44 mg/dL (ref 0.44–1.00)
GFR, Estimated: >60 mL/min (ref >60)
Glucose, Bld: 169 mg/dL — ABNORMAL HIGH (ref 70–99)
Potassium: 4.2 mmol/L (ref 3.5–5.1)
Sodium: 139 mmol/L (ref 135–145)
Total Bilirubin: 0.7 mg/dL (ref <1.2)
Total Protein: 7.1 g/dL (ref 6.5–8.1)

## 2023-05-21 LAB — ACETAMINOPHEN LEVEL: Acetaminophen (Tylenol), Serum: <10 ug/mL — ABNORMAL LOW (ref 10–30)

## 2023-05-21 LAB — RAPID URINE DRUG SCREEN, HOSP PERFORMED
Amphetamines: NOT DETECTED
Barbiturates: NOT DETECTED
Benzodiazepines: NOT DETECTED
Cocaine: NOT DETECTED
Opiates: NOT DETECTED
Tetrahydrocannabinol: NOT DETECTED

## 2023-05-21 LAB — ETHANOL: Alcohol, Ethyl (B): 42 mg/dL — ABNORMAL HIGH (ref <10)

## 2023-05-21 LAB — VALPROIC ACID LEVEL: Valproic Acid Lvl: <10 ug/mL — ABNORMAL LOW (ref 50.0–100.0)

## 2023-05-21 LAB — SALICYLATE LEVEL: Salicylate Lvl: <7 mg/dL — ABNORMAL LOW (ref 7.0–30.0)

## 2023-05-21 MED ORDER — DIVALPROEX SODIUM 500 MG PO DR TAB
500.0000 mg | DELAYED_RELEASE_TABLET | Freq: Two times a day (BID) | ORAL | Status: DC
  Administered 2023-05-21: 500 mg via ORAL
  Filled 2023-05-21: qty 1

## 2023-05-21 MED ORDER — LEVETIRACETAM ER 500 MG PO TB24: 500.0000 mg | ORAL_TABLET | Freq: Two times a day (BID) | ORAL | Status: DC

## 2023-05-21 MED ORDER — MOMETASONE FURO-FORMOTEROL FUM 200-5 MCG/ACT IN AERO
2.0000 | INHALATION_SPRAY | Freq: Two times a day (BID) | RESPIRATORY_TRACT | Status: DC
  Filled 2023-05-21 (×2): qty 8.8

## 2023-05-21 MED ORDER — FAMOTIDINE 20 MG PO TABS
20.0000 mg | ORAL_TABLET | Freq: Every day | ORAL | Status: DC
  Administered 2023-05-21: 20 mg via ORAL
  Filled 2023-05-21: qty 1

## 2023-05-21 MED ORDER — PANTOPRAZOLE SODIUM 40 MG PO TBEC
40.0000 mg | DELAYED_RELEASE_TABLET | Freq: Every day | ORAL | Status: DC
  Administered 2023-05-21: 40 mg via ORAL
  Filled 2023-05-21: qty 1

## 2023-05-21 MED ORDER — ZONISAMIDE 100 MG PO CAPS
200.0000 mg | ORAL_CAPSULE | Freq: Every day | ORAL | Status: DC
  Administered 2023-05-21: 200 mg via ORAL
  Filled 2023-05-21: qty 2

## 2023-05-21 MED ORDER — GABAPENTIN 300 MG PO CAPS
300.0000 mg | ORAL_CAPSULE | Freq: Three times a day (TID) | ORAL | Status: DC
Start: 2023-05-21 — End: 2023-05-21
  Administered 2023-05-21: 300 mg via ORAL
  Filled 2023-05-21: qty 1

## 2023-05-21 MED ORDER — LEVETIRACETAM 500 MG PO TABS
500.0000 mg | ORAL_TABLET | Freq: Two times a day (BID) | ORAL | Status: DC
  Administered 2023-05-21: 500 mg via ORAL
  Filled 2023-05-21: qty 1

## 2023-05-21 MED ORDER — FUROSEMIDE 20 MG PO TABS
20.0000 mg | ORAL_TABLET | Freq: Every day | ORAL | Status: DC
  Administered 2023-05-21: 20 mg via ORAL
  Filled 2023-05-21: qty 1

## 2023-05-21 MED ORDER — IPRATROPIUM-ALBUTEROL 0.5-2.5 (3) MG/3ML IN SOLN: 3.0000 mL | Freq: Four times a day (QID) | RESPIRATORY_TRACT | Status: DC | PRN

## 2023-05-21 MED ORDER — TIZANIDINE HCL 4 MG PO TABS: 2.0000 mg | ORAL_TABLET | Freq: Every day | ORAL | Status: DC

## 2023-05-21 MED ORDER — METFORMIN HCL ER 750 MG PO TB24: 750.0000 mg | ORAL_TABLET | Freq: Every day | ORAL | Status: DC

## 2023-05-21 MED ORDER — ARIPIPRAZOLE 10 MG PO TABS
10.0000 mg | ORAL_TABLET | Freq: Every day | ORAL | Status: DC
Start: 2023-05-21 — End: 2023-05-21
  Administered 2023-05-21: 10 mg via ORAL
  Filled 2023-05-21: qty 1
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
## 2023-05-21 NOTE — ED Notes (Signed)
Pt changed into paper scrubs.

## 2023-05-21 NOTE — BH Assessment (Signed)
Holly ,RN reports that pt is too somnolent for assessment at this time.

## 2023-05-21 NOTE — ED Notes (Signed)
Pt able to get in touch w/ her daughter who will pick her up

## 2023-05-21 NOTE — Discharge Instructions (Signed)
Safety Plan [name] will reach out to [employer] (daughter), call 911 or call mobile crisis, or go to nearest emergency room if condition worsens or if suicidal thoughts become active Patients' will follow up with [facility]/substance abuse resources, if/when amenable The suicide prevention education provided includes the following: Suicide risk factors Suicide prevention and interventions National Suicide Hotline telephone number [facility] assessment telephone number [facility] Emergency Assistance 911 [facility] and/or Residential Mobile Crisis Unit telephone number Request made of family/significant other to:  Madilyn Fireman (daughter) Remove weapons (e.g., guns, rifles, knives), all items previously/currently identified as safety concern.   Remove drugs/medications (over the counter, prescriptions, illicit drugs), all items previously/currently identified as a safety concern.

## 2023-05-21 NOTE — ED Notes (Signed)
Reviewed discharge instructions with patient. Follow-up care and safety plan reviewed. Patient verbalized understanding. Patient A&Ox4, VSS, and ambulatory with steady gait upon discharge. Taxi voucher provided at time of departure.

## 2023-05-21 NOTE — ED Provider Notes (Signed)
  Physical Exam  BP 134/85 (BP Location: Left Arm)   Pulse (!) 105   Temp 97.9 F (36.6 C) (Oral)   Resp 18   LMP 01/08/2011   SpO2 100%   Physical Exam  Procedures  Procedures  ED Course / MDM    Medical Decision Making Amount and/or Complexity of Data Reviewed Labs: ordered. ECG/medicine tests: ordered.  Risk Prescription drug management.  Patient is been seen not by psychiatry and cleared for discharge.  IVC reversed.       Benjiman Core, MD 05/21/23 1043

## 2023-06-07 DIAGNOSIS — J449 Chronic obstructive pulmonary disease, unspecified: Secondary | ICD-10-CM | POA: Diagnosis not present

## 2023-06-11 ENCOUNTER — Other Ambulatory Visit: Payer: Self-pay

## 2023-06-11 ENCOUNTER — Emergency Department (HOSPITAL_COMMUNITY)
Admission: EM | Admit: 2023-06-11 | Discharge: 2023-06-16 | Disposition: A | Discharge status: Home / Self Care | Payer: 59

## 2023-06-11 ENCOUNTER — Emergency Department (HOSPITAL_COMMUNITY): Payer: 59

## 2023-06-11 ENCOUNTER — Encounter (HOSPITAL_COMMUNITY): Payer: Self-pay

## 2023-06-11 DIAGNOSIS — R000 Tachycardia, unspecified: Secondary | ICD-10-CM | POA: Diagnosis not present

## 2023-06-11 DIAGNOSIS — F1910 Other psychoactive substance abuse, uncomplicated: Secondary | ICD-10-CM | POA: Diagnosis present

## 2023-06-11 DIAGNOSIS — F10129 Alcohol abuse with intoxication, unspecified: Secondary | ICD-10-CM | POA: Diagnosis not present

## 2023-06-11 DIAGNOSIS — F339 Major depressive disorder, recurrent, unspecified: Secondary | ICD-10-CM | POA: Insufficient documentation

## 2023-06-11 DIAGNOSIS — Y905 Blood alcohol level of 100-119 mg/100 ml: Secondary | ICD-10-CM | POA: Insufficient documentation

## 2023-06-11 DIAGNOSIS — F419 Anxiety disorder, unspecified: Secondary | ICD-10-CM | POA: Insufficient documentation

## 2023-06-11 DIAGNOSIS — R4182 Altered mental status, unspecified: Secondary | ICD-10-CM | POA: Diagnosis not present

## 2023-06-11 DIAGNOSIS — F1410 Cocaine abuse, uncomplicated: Secondary | ICD-10-CM | POA: Diagnosis not present

## 2023-06-11 DIAGNOSIS — F259 Schizoaffective disorder, unspecified: Secondary | ICD-10-CM | POA: Diagnosis present

## 2023-06-11 DIAGNOSIS — F329 Major depressive disorder, single episode, unspecified: Secondary | ICD-10-CM | POA: Diagnosis not present

## 2023-06-11 DIAGNOSIS — R0602 Shortness of breath: Secondary | ICD-10-CM | POA: Insufficient documentation

## 2023-06-11 DIAGNOSIS — F411 Generalized anxiety disorder: Secondary | ICD-10-CM | POA: Diagnosis not present

## 2023-06-11 DIAGNOSIS — F69 Unspecified disorder of adult personality and behavior: Secondary | ICD-10-CM | POA: Diagnosis present

## 2023-06-11 DIAGNOSIS — I517 Cardiomegaly: Secondary | ICD-10-CM | POA: Diagnosis not present

## 2023-06-11 DIAGNOSIS — R451 Restlessness and agitation: Secondary | ICD-10-CM | POA: Insufficient documentation

## 2023-06-11 LAB — CBC WITH DIFFERENTIAL/PLATELET
Abs Immature Granulocytes: 0.02 10*3/uL (ref 0.00–0.07)
Basophils Absolute: 0.1 10*3/uL (ref 0.0–0.1)
Basophils Relative: 1 %
Eosinophils Absolute: 0.1 10*3/uL (ref 0.0–0.5)
Eosinophils Relative: 1 %
HCT: 29.2 % — ABNORMAL LOW (ref 36.0–46.0)
Hemoglobin: 8.3 g/dL — ABNORMAL LOW (ref 12.0–15.0)
Immature Granulocytes: 0 %
Lymphocytes Relative: 23 %
Lymphs Abs: 1.7 10*3/uL (ref 0.7–4.0)
MCH: 19.8 pg — ABNORMAL LOW (ref 26.0–34.0)
MCHC: 28.4 g/dL — ABNORMAL LOW (ref 30.0–36.0)
MCV: 69.7 fL — ABNORMAL LOW (ref 80.0–100.0)
Monocytes Absolute: 0.6 10*3/uL (ref 0.1–1.0)
Monocytes Relative: 9 %
Neutro Abs: 4.8 10*3/uL (ref 1.7–7.7)
Neutrophils Relative %: 66 %
Platelets: 269 10*3/uL (ref 150–400)
RBC: 4.19 MIL/uL (ref 3.87–5.11)
RDW: 20.9 % — ABNORMAL HIGH (ref 11.5–15.5)
WBC: 7.2 10*3/uL (ref 4.0–10.5)
nRBC: 0 % (ref 0.0–0.2)

## 2023-06-11 LAB — BASIC METABOLIC PANEL
Anion gap: 13 (ref 5–15)
BUN: 7 mg/dL (ref 6–20)
CO2: 19 mmol/L — ABNORMAL LOW (ref 22–32)
Calcium: 8.6 mg/dL — ABNORMAL LOW (ref 8.9–10.3)
Chloride: 100 mmol/L (ref 98–111)
Creatinine, Ser: 0.46 mg/dL (ref 0.44–1.00)
GFR, Estimated: >60 mL/min (ref >60)
Glucose, Bld: 149 mg/dL — ABNORMAL HIGH (ref 70–99)
Potassium: 3.9 mmol/L (ref 3.5–5.1)
Sodium: 132 mmol/L — ABNORMAL LOW (ref 135–145)

## 2023-06-11 LAB — RAPID URINE DRUG SCREEN, HOSP PERFORMED
Amphetamines: NOT DETECTED
Barbiturates: NOT DETECTED
Benzodiazepines: NOT DETECTED
Cocaine: POSITIVE — AB
Opiates: NOT DETECTED
Tetrahydrocannabinol: NOT DETECTED

## 2023-06-11 LAB — CBG MONITORING, ED: Glucose-Capillary: 188 mg/dL — ABNORMAL HIGH (ref 70–99)

## 2023-06-11 LAB — TROPONIN I (HIGH SENSITIVITY): Troponin I (High Sensitivity): 6 ng/L (ref <18)

## 2023-06-11 LAB — BRAIN NATRIURETIC PEPTIDE: B Natriuretic Peptide: 106.2 pg/mL — ABNORMAL HIGH (ref 0.0–100.0)

## 2023-06-11 LAB — ETHANOL: Alcohol, Ethyl (B): 109 mg/dL — ABNORMAL HIGH (ref <10)

## 2023-06-11 MED ORDER — LORAZEPAM 2 MG/ML IJ SOLN: 1.0000 mg | Freq: Once | INTRAMUSCULAR | Status: DC

## 2023-06-11 MED ORDER — THIAMINE MONONITRATE 100 MG PO TABS
100.0000 mg | ORAL_TABLET | Freq: Every day | ORAL | Status: DC
  Administered 2023-06-12: 100 mg via ORAL
  Filled 2023-06-11: qty 1

## 2023-06-11 MED ORDER — LORAZEPAM 2 MG/ML IJ SOLN: 1.0000 mg | INTRAMUSCULAR | Status: DC | PRN

## 2023-06-11 MED ORDER — IPRATROPIUM-ALBUTEROL 0.5-2.5 (3) MG/3ML IN SOLN: 3.0000 mL | Freq: Once | RESPIRATORY_TRACT | Status: DC

## 2023-06-11 MED ORDER — LEVETIRACETAM 500 MG PO TABS
750.0000 mg | ORAL_TABLET | Freq: Once | ORAL | Status: AC
  Administered 2023-06-11: 750 mg via ORAL
  Filled 2023-06-11: qty 1

## 2023-06-11 MED ORDER — THIAMINE HCL 100 MG/ML IJ SOLN: 100.0000 mg | Freq: Every day | INTRAMUSCULAR | Status: DC

## 2023-06-11 MED ORDER — FOLIC ACID 1 MG PO TABS
1.0000 mg | ORAL_TABLET | Freq: Every day | ORAL | Status: DC
  Administered 2023-06-12 – 2023-06-16 (×5): 1 mg via ORAL
  Filled 2023-06-11 (×5): qty 1

## 2023-06-11 MED ORDER — LORAZEPAM 1 MG PO TABS: 1.0000 mg | ORAL_TABLET | ORAL | Status: DC | PRN

## 2023-06-11 MED ORDER — ZIPRASIDONE MESYLATE 20 MG IM SOLR
20.0000 mg | Freq: Once | INTRAMUSCULAR | Status: AC
  Administered 2023-06-11: 20 mg via INTRAMUSCULAR
  Filled 2023-06-11: qty 20

## 2023-06-11 NOTE — ED Notes (Signed)
Urine sent to lab 

## 2023-06-11 NOTE — ED Notes (Signed)
ED tech attempted to get troponin but pt pulled away and we were not able to get enough blood

## 2023-06-11 NOTE — ED Notes (Signed)
Md in room at this time. Patient refusing to talk to staff and refusing all care. Patient is naked and refuses to put hospital gown on .

## 2023-06-11 NOTE — ED Notes (Signed)
Patient provided a drink.

## 2023-06-11 NOTE — ED Notes (Signed)
Restraints discontinued.

## 2023-06-11 NOTE — ED Triage Notes (Addendum)
BIBA and [employer] after being transported to jail and patient became agitated and stating she was sob and couldn't breath.  Patient removed clothing in police car. Police reports etoh usage and crack usage.  Pt admits to beer.  Hx schizoaffective disorder. Patient is refusing all car on arrival.

## 2023-06-11 NOTE — ED Provider Notes (Signed)
[place] EMERGENCY DEPARTMENT AT [facility] Provider Note   CSN: [id number] Arrival date & time: 06/11/23  1711     History  Chief Complaint  Patient presents with   Agitation    [name] is a 57 y.o. female.  56 year old female with past medical history of polysubstance abuse in the past as well as seizures presenting to the emergency department today with agitation.  Apparently police went out to obtain the patient on a domestic issue when she became very agitated and combative.  The police were actually going to take the patient back to her house that she has a known substance abuser but she started becoming agitated and requested to go to jail.  The patient was complaining of some shortness of breath which she is no longer complaining of.  When she arrived to jail she apparently took her clothes off and was becoming very agitated and was sent to the emergency department for psychiatric evaluation.  She is not endorsing any shortness of breath now but is yelling incoherently here.  She is not able to provide any other history here and is very agitated.  She is trying to spit on nursing staff and is requiring restraints at this time.        Home Medications Prior to Admission medications   Medication Sig Start Date End Date Taking? Authorizing Provider  albuterol (VENTOLIN HFA) 108 (90 Base) MCG/ACT inhaler Inhale 1-2 puffs into the lungs every 6 (six) hours as needed for wheezing or shortness of breath. 04/01/23   Lonell Grandchild, MD  ARIPiprazole (ABILIFY) 10 MG tablet Take 1 tablet (10 mg total) by mouth daily. Patient not taking: Reported on 05/02/2023 05/02/23   Sarina Ill, DO  budesonide-formoterol [facility]) 160-4.5 MCG/ACT inhaler Inhale 2 puffs into the lungs 2 (two) times daily. 02/25/23   Sabas Sous, MD  cyanocobalamin (VITAMIN B12) 1000 MCG tablet Take 1 tablet (1,000 mcg total) by mouth daily. Patient not taking: Reported on  05/02/2023 03/16/23 07/24/23  Zigmund Daniel., MD  divalproex (DEPAKOTE) 500 MG DR tablet Take 1 tablet (500 mg total) by mouth every 12 (twelve) hours. Patient not taking: Reported on 05/02/2023 05/01/23   Sarina Ill, DO  famotidine (PEPCID) 20 MG tablet Take 1 tablet (20 mg total) by mouth daily. Patient not taking: Reported on 05/02/2023 02/25/23   Sabas Sous, MD  ferrous sulfate 325 (65 FE) MG tablet Take 1 tablet (325 mg total) by mouth every other day. Patient not taking: Reported on 05/02/2023 03/18/23 10/02/23  Zigmund Daniel., MD  fluticasone-salmeterol (ADVAIR) 250-50 MCG/ACT AEPB Inhale 1 puff into the lungs in the morning and at bedtime.    [provider]  furosemide (LASIX) 20 MG tablet Take 1 tablet (20 mg total) by mouth daily. Patient not taking: Reported on 05/02/2023 02/24/23   Achille Rich, PA-C  gabapentin (NEURONTIN) 300 MG capsule Take 1 capsule (300 mg total) by mouth 3 (three) times daily. Patient not taking: Reported on 05/02/2023 05/01/23   Sarina Ill, DO  hyoscyamine (LEVSIN/SL) 0.125 MG SL tablet Place 1 tablet (0.125 mg total) under the tongue every 4 (four) hours as needed (pain). Up to 1.25 mg daily Patient not taking: Reported on 05/02/2023 01/23/23   Arthor Captain, PA-C  ipratropium-albuterol (DUONEB) 0.5-2.5 (3) MG/3ML SOLN Take 3 mLs by nebulization every 6 (six) hours as needed (for shortness of breath or wheezing).    [provider]  levETIRAcetam (KEPPRA XR) 500 MG 24 hr tablet Take 1 tablet (500 mg total) by mouth 2 (two) times daily. Patient not taking: Reported on 05/02/2023 05/01/23   Sarina Ill, DO  metFORMIN (GLUCOPHAGE-XR) 750 MG 24 hr tablet Take 750 mg by mouth daily. Patient not taking: Reported on 05/02/2023 03/23/23   [provider]  naproxen (NAPROSYN) 500 MG tablet Take 500 mg by mouth 2 (two) times daily with a meal. 05/08/23   [provider]  ondansetron (ZOFRAN) 4  MG tablet Take 1 tablet (4 mg total) by mouth every 6 (six) hours. Patient not taking: Reported on 05/02/2023 01/20/23   Prosperi, Christian H, PA-C  ondansetron (ZOFRAN-ODT) 4 MG disintegrating tablet Take 1 tablet (4 mg total) by mouth every 8 (eight) hours as needed for nausea or vomiting. Patient not taking: Reported on 05/02/2023 03/01/23   Laurence Spates, MD  OXYGEN Inhale 4-5 L/min into the lungs as needed (for COPD-related symptoms).    [provider]  pantoprazole (PROTONIX) 40 MG tablet Take 1 tablet (40 mg total) by mouth daily. Patient not taking: Reported on 05/02/2023 03/17/23 05/02/23  Zigmund Daniel., MD  predniSONE (DELTASONE) 20 MG tablet Take 2 tablets (40 mg total) by mouth daily with breakfast. Patient not taking: Reported on 05/02/2023 04/16/23   Gilda Crease, MD  sucralfate (CARAFATE) 1 g tablet Take 1 tablet (1 g total) by mouth 4 (four) times daily -  with meals and at bedtime. Patient not taking: Reported on 05/02/2023 02/11/23   Horton, Mayer Masker, MD  tiZANidine (ZANAFLEX) 2 MG tablet Take 2 mg by mouth at bedtime. Patient not taking: Reported on 05/02/2023 02/19/23   [provider]  zonisamide (ZONEGRAN) 100 MG capsule Take 2 capsules (200 mg total) by mouth daily. Patient not taking: Reported on 05/02/2023 01/12/23   Lewie Chamber, MD      Allergies    Iron dextran, Aspirin, and Penicillins    Review of Systems   Review of Systems  Physical Exam Updated Vital Signs BP 121/72   Pulse (!) 107   Temp 98.8 F (37.1 C)   Resp 20   Ht 5\' 2"  (1.575 m)   Wt 52 kg   LMP 01/08/2011   SpO2 97%   BMI 20.97 kg/m  Physical Exam Vitals and nursing note reviewed.   Gen: Agitated, yelling and dry heaving intermittently throughout interview Eyes: PERRL, EOMI HEENT: no oropharyngeal swelling Neck: trachea midline Resp: No respiratory distress noted Card: Tachycardic Abd: nontender, nondistended Extremities: no calf tenderness, no  edema Skin: no rashes Psyc: Agitated   ED Results / Procedures / Treatments   Labs (all labs ordered are listed, but only abnormal results are displayed) Labs Reviewed  BASIC METABOLIC PANEL - Abnormal; Notable for the following components:      Result Value   Sodium 132 (*)    CO2 19 (*)    Glucose, Bld 149 (*)    Calcium 8.6 (*)    All other components within normal limits  BRAIN NATRIURETIC PEPTIDE - Abnormal; Notable for the following components:   B Natriuretic Peptide 106.2 (*)    All other components within normal limits  ETHANOL - Abnormal; Notable for the following components:   Alcohol, Ethyl (B) 109 (*)    All other components within normal limits  RAPID URINE DRUG SCREEN, HOSP PERFORMED - Abnormal; Notable for the following components:   Cocaine POSITIVE (*)    All other components within normal  limits  CBC WITH DIFFERENTIAL/PLATELET - Abnormal; Notable for the following components:   Hemoglobin 8.3 (*)    HCT 29.2 (*)    MCV 69.7 (*)    MCH 19.8 (*)    MCHC 28.4 (*)    RDW 20.9 (*)    All other components within normal limits  CBG MONITORING, ED - Abnormal; Notable for the following components:   Glucose-Capillary 188 (*)    All other components within normal limits  TROPONIN I (HIGH SENSITIVITY)  TROPONIN I (HIGH SENSITIVITY)    EKG EKG Interpretation Date/Time:  Thursday June 11 2023 17:26:28 EST Ventricular Rate:  113 PR Interval:  139 QRS Duration:  86 QT Interval:  331 QTC Calculation: 454 R Axis:   67  Text Interpretation: Sinus tachycardia Biatrial enlargement Confirmed by Beckey Downing [phone]) on 06/11/2023 5:52:35 PM  Radiology DG Chest Port 1 View Result Date: 06/11/2023 CLINICAL DATA:  Shortness of breath EXAM: PORTABLE CHEST 1 VIEW COMPARISON:  August 12 24 FINDINGS: The heart size and mediastinal contours are within normal limits. Both lungs are clear. The visualized skeletal structures are unremarkable. IMPRESSION: No active disease.  Electronically Signed   By: Minerva Fester M.D.   On: 06/11/2023 22:06    Procedures Procedures    Medications Ordered in ED Medications  ipratropium-albuterol (DUONEB) 0.5-2.5 (3) MG/3ML nebulizer solution 3 mL (0 mLs Nebulization Hold 06/11/23 2318)  thiamine (VITAMIN B1) tablet 100 mg (has no administration in time range)    Or  thiamine (VITAMIN B1) injection 100 mg (has no administration in time range)  folic acid (FOLVITE) tablet 1 mg (has no administration in time range)  ziprasidone (GEODON) injection 20 mg (20 mg Intramuscular Given 06/11/23 1725)  levETIRAcetam (KEPPRA) tablet 750 mg (750 mg Oral Given 06/11/23 2044)    ED Course/ Medical Decision Making/ A&P                                 Medical Decision Making 58 year old female with past medical history of polysubstance abuse and bipolar disorder presenting to the emergency department today after becoming agitated after police came to the patient's house about a domestic dispute.  I will initiate a medical workup on the patient to evaluate for any underlying medical etiology for symptoms.  Based on her history I suspect strongly this is due to her underlying psychiatric issues/polysubstance abuse.  I will give the patient IM Geodon for her safety as well as the safety of staff here.  Will place patient in restraints.  The patient's agitation did improve here.  On reassessment she is still confused.  She did report to police that she used some cocaine and alcohol earlier today.  They apparently offered to drive her home but she told them she wanted to go to jail.  When they took her there she apparently took her clothes off and became more and more agitated so she was placed on IVC and was sent here for medical evaluation.  The patient's vital signs remained stable here.  Patient's blood alcohol level is mildly elevated here.  Given her underlying psychiatric issues and agitation I will keep patient on an IVC here and have her  evaluated by psychiatry.  It does appear that she was seen by psychiatry relatively recently from our facility.  The patient initial troponin is negative.  If her second troponin is negative she is medically cleared for psychiatric treatment.  She  is being evaluated by our telepsych service.    Critical care time 38 minutes including reassessments, review of old records, treatment of acutely agitated patient on initial arrival with IM medications  Amount and/or Complexity of Data Reviewed Labs: ordered. Radiology: ordered.  Risk OTC drugs. Prescription drug management.           Final Clinical Impression(s) / ED Diagnoses Final diagnoses:  Agitation    Rx / DC Orders ED Discharge Orders     None         Durwin Glaze, MD 06/11/23 2330

## 2023-06-11 NOTE — ED Notes (Addendum)
Patent screaming out "let me out of here" unable to redirect patient

## 2023-06-11 NOTE — ED Notes (Signed)
Called to room by [employer], patient foaming and shaking. Suction provided. Patient alert to painful stimuli and confused. Patient unable to follow commands. MD aware

## 2023-06-11 NOTE — ED Notes (Signed)
Phlebotomy attempt unsuccessful

## 2023-06-11 NOTE — ED Notes (Signed)
When asked about cocaine usage pt states "I only drunk some beer."

## 2023-06-11 NOTE — ED Notes (Signed)
Patient placed on bedpan at this time.

## 2023-06-12 DIAGNOSIS — F329 Major depressive disorder, single episode, unspecified: Secondary | ICD-10-CM | POA: Diagnosis not present

## 2023-06-12 DIAGNOSIS — F1910 Other psychoactive substance abuse, uncomplicated: Secondary | ICD-10-CM | POA: Diagnosis not present

## 2023-06-12 DIAGNOSIS — F10129 Alcohol abuse with intoxication, unspecified: Secondary | ICD-10-CM | POA: Diagnosis not present

## 2023-06-12 DIAGNOSIS — F411 Generalized anxiety disorder: Secondary | ICD-10-CM | POA: Diagnosis not present

## 2023-06-12 LAB — TROPONIN I (HIGH SENSITIVITY): Troponin I (High Sensitivity): 4 ng/L (ref <18)

## 2023-06-12 MED ORDER — ALBUTEROL SULFATE HFA 108 (90 BASE) MCG/ACT IN AERS
2.0000 | INHALATION_SPRAY | RESPIRATORY_TRACT | Status: DC | PRN
  Administered 2023-06-12 – 2023-06-13 (×2): 2 via RESPIRATORY_TRACT
  Filled 2023-06-12 (×2): qty 6.7

## 2023-06-12 MED ORDER — THIAMINE HCL 100 MG/ML IJ SOLN
100.0000 mg | Freq: Once | INTRAMUSCULAR | Status: DC
  Filled 2023-06-12: qty 2

## 2023-06-12 MED ORDER — NAPROXEN 500 MG PO TABS
500.0000 mg | ORAL_TABLET | Freq: Two times a day (BID) | ORAL | Status: DC
Start: 2023-06-12 — End: 2023-06-16
  Administered 2023-06-12 – 2023-06-16 (×8): 500 mg via ORAL
  Filled 2023-06-12 (×8): qty 1

## 2023-06-12 MED ORDER — LOPERAMIDE HCL 2 MG PO CAPS
2.0000 mg | ORAL_CAPSULE | ORAL | Status: AC | PRN
Start: 2023-06-12 — End: 2023-06-15

## 2023-06-12 MED ORDER — LORAZEPAM 1 MG PO TABS: 1.0000 mg | ORAL_TABLET | Freq: Three times a day (TID) | ORAL | Status: DC

## 2023-06-12 MED ORDER — LORAZEPAM 1 MG PO TABS: 1.0000 mg | ORAL_TABLET | Freq: Four times a day (QID) | ORAL | Status: DC | PRN

## 2023-06-12 MED ORDER — LORAZEPAM 1 MG PO TABS
1.0000 mg | ORAL_TABLET | Freq: Four times a day (QID) | ORAL | Status: AC
  Administered 2023-06-12 – 2023-06-13 (×6): 1 mg via ORAL
  Filled 2023-06-12 (×6): qty 1

## 2023-06-12 MED ORDER — DIVALPROEX SODIUM 500 MG PO DR TAB
500.0000 mg | DELAYED_RELEASE_TABLET | Freq: Two times a day (BID) | ORAL | Status: DC
  Administered 2023-06-12 – 2023-06-14 (×4): 500 mg via ORAL
  Filled 2023-06-12 (×4): qty 1

## 2023-06-12 MED ORDER — NICOTINE POLACRILEX 2 MG MT GUM
2.0000 mg | CHEWING_GUM | OROMUCOSAL | Status: DC | PRN
  Administered 2023-06-12: 2 mg via ORAL
  Filled 2023-06-12: qty 1

## 2023-06-12 MED ORDER — ARIPIPRAZOLE 10 MG PO TABS
10.0000 mg | ORAL_TABLET | Freq: Every day | ORAL | Status: DC
  Administered 2023-06-12 – 2023-06-16 (×5): 10 mg via ORAL
  Filled 2023-06-12: qty 2
  Filled 2023-06-12 (×2): qty 1
  Filled 2023-06-12: qty 2
  Filled 2023-06-12: qty 1

## 2023-06-12 MED ORDER — NICOTINE 21 MG/24HR TD PT24
21.0000 mg | MEDICATED_PATCH | Freq: Every day | TRANSDERMAL | Status: DC
  Administered 2023-06-12 – 2023-06-15 (×4): 21 mg via TRANSDERMAL
  Filled 2023-06-12 (×5): qty 1

## 2023-06-12 MED ORDER — HYDROXYZINE HCL 25 MG PO TABS
25.0000 mg | ORAL_TABLET | Freq: Four times a day (QID) | ORAL | Status: AC | PRN
  Administered 2023-06-13 (×2): 25 mg via ORAL
  Filled 2023-06-12 (×2): qty 1

## 2023-06-12 MED ORDER — LORAZEPAM 1 MG PO TABS: 1.0000 mg | ORAL_TABLET | Freq: Every day | ORAL | Status: DC

## 2023-06-12 MED ORDER — LORAZEPAM 1 MG PO TABS: 1.0000 mg | ORAL_TABLET | Freq: Two times a day (BID) | ORAL | Status: DC

## 2023-06-12 MED ORDER — THIAMINE MONONITRATE 100 MG PO TABS
100.0000 mg | ORAL_TABLET | Freq: Every day | ORAL | Status: DC
  Administered 2023-06-13 – 2023-06-16 (×4): 100 mg via ORAL
  Filled 2023-06-12 (×4): qty 1

## 2023-06-12 MED ORDER — GABAPENTIN 300 MG PO CAPS
300.0000 mg | ORAL_CAPSULE | Freq: Three times a day (TID) | ORAL | Status: DC
Start: 2023-06-12 — End: 2023-06-12

## 2023-06-12 MED ORDER — LEVETIRACETAM ER 500 MG PO TB24
500.0000 mg | ORAL_TABLET | Freq: Two times a day (BID) | ORAL | Status: DC
Start: 2023-06-12 — End: 2023-06-16
  Administered 2023-06-12 – 2023-06-16 (×8): 500 mg via ORAL
  Filled 2023-06-12 (×10): qty 1

## 2023-06-12 MED ORDER — METFORMIN HCL ER 750 MG PO TB24
750.0000 mg | ORAL_TABLET | Freq: Every day | ORAL | Status: DC
  Administered 2023-06-13 – 2023-06-16 (×4): 750 mg via ORAL
  Filled 2023-06-12 (×5): qty 1

## 2023-06-12 MED ORDER — ONDANSETRON 4 MG PO TBDP
4.0000 mg | ORAL_TABLET | Freq: Four times a day (QID) | ORAL | Status: AC | PRN
  Administered 2023-06-13 – 2023-06-15 (×2): 4 mg via ORAL
  Filled 2023-06-12 (×2): qty 1

## 2023-06-12 NOTE — ED Notes (Signed)
Spoke with daughter. Daughter stated that the Pt currently lives alone and she does not have any kind of guardianship over the Pt. Daughter stated that she would be applying for guardianship in the near future, but nothing is currently in place. Daughter stated that she is in [place] until sunday

## 2023-06-12 NOTE — ED Provider Notes (Signed)
Repeat troponin is stable.  Patient is medically cleared for TTS evaluation.   Shon Baton, MD 06/12/23 0157

## 2023-06-12 NOTE — ED Provider Notes (Signed)
Emergency Medicine Observation Re-evaluation Note  [name] is a 56 y.o. female, seen on rounds today.  Pt initially presented to the ED for complaints of Agitation Currently, the patient is resting.  Physical Exam  BP 119/85   Pulse (!) 112   Temp 98.3 F (36.8 C) (Oral)   Resp (!) 23   Ht 5\' 2"  (1.575 m)   Wt 52 kg   LMP 01/08/2011   SpO2 94%   BMI 20.97 kg/m  Physical Exam General: no acute distress Lungs: normal effort Psych: no SI  ED Course / MDM  EKG:EKG Interpretation Date/Time:  Thursday June 11 2023 17:26:28 EST Ventricular Rate:  113 PR Interval:  139 QRS Duration:  86 QT Interval:  331 QTC Calculation: 454 R Axis:   67  Text Interpretation: Sinus tachycardia Biatrial enlargement Confirmed by Beckey Downing [phone]) on 06/11/2023 5:52:35 PM  I have reviewed the labs performed to date as well as medications administered while in observation.  Recent changes in the last 24 hours include geodon yesterday for agitation.  Plan  Current plan is for psychiatric evaluation, under IVC.    Pricilla Loveless, MD 06/12/23 [phone]

## 2023-06-12 NOTE — ED Notes (Signed)
Pt is eating breakfast and watching tv
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
## 2023-06-12 NOTE — BH Assessment (Signed)
Clinician messaged Buckner Malta, Paramedic, "Hey. It's Trey with TTS. Is the pt able to engage in the assessment, if so the pt will need to be placed in a private room. Is the pt medically cleared?"   Clinician awaiting response.    Redmond Pulling, MS, [facility], [facility] Triage Specialist [phone]

## 2023-06-12 NOTE — ED Notes (Signed)
Pt has one bag of personal belonging in cabinet 19-22 at desk. A smaller bag of personal items are inside.
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
## 2023-06-12 NOTE — ED Notes (Signed)
Please call daughter [name] before DC

## 2023-06-12 NOTE — Progress Notes (Signed)
TOC consulted for substance abuse resources. Resources attached to AVS.  Appears pt will be followed by TTS. TOC will sign off at this time.

## 2023-06-13 DIAGNOSIS — F411 Generalized anxiety disorder: Secondary | ICD-10-CM

## 2023-06-13 DIAGNOSIS — J449 Chronic obstructive pulmonary disease, unspecified: Secondary | ICD-10-CM | POA: Diagnosis not present

## 2023-06-13 DIAGNOSIS — F1910 Other psychoactive substance abuse, uncomplicated: Secondary | ICD-10-CM | POA: Diagnosis not present

## 2023-06-13 MED ORDER — LORAZEPAM 1 MG PO TABS
0.0000 mg | ORAL_TABLET | Freq: Four times a day (QID) | ORAL | Status: AC
  Administered 2023-06-13: 4 mg via ORAL
  Administered 2023-06-14 (×2): 1 mg via ORAL
  Filled 2023-06-13: qty 1
  Filled 2023-06-13: qty 4
  Filled 2023-06-13: qty 1

## 2023-06-13 MED ORDER — LORAZEPAM 1 MG PO TABS: 0.0000 mg | ORAL_TABLET | Freq: Two times a day (BID) | ORAL | Status: DC

## 2023-06-13 NOTE — ED Notes (Signed)
Patient is refusing for me to get her vital signs at this time.
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
## 2023-06-13 NOTE — ED Provider Notes (Signed)
Emergency Medicine Observation Re-evaluation Note  [name] is a 57 y.o. female, seen on rounds today.  Pt initially presented to the ED for complaints of Agitation Currently, the patient is awaiting psychiatric placement.  Physical Exam  BP 109/83 (BP Location: Right Arm)   Pulse (!) 119   Temp 97.8 F (36.6 C) (Oral)   Resp 20   Ht 5\' 2"  (1.575 m)   Wt 52 kg   LMP 01/08/2011   SpO2 100%   BMI 20.97 kg/m  Physical Exam Awake and in no acute distress  ED Course / MDM  EKG:EKG Interpretation Date/Time:  Thursday June 11 2023 17:26:28 EST Ventricular Rate:  113 PR Interval:  139 QRS Duration:  86 QT Interval:  331 QTC Calculation: 454 R Axis:   67  Text Interpretation: Sinus tachycardia Biatrial enlargement Confirmed by Beckey Downing [phone]) on 06/11/2023 5:52:35 PM  I have reviewed the labs performed to date as well as medications administered while in observation.  Recent changes in the last 24 hours include none.  Plan  Current plan is for psychiatric placement.    Bethann Berkshire, MD 06/13/23 [phone]

## 2023-06-13 NOTE — ED Notes (Signed)
Patient keeps yelling out the room

## 2023-06-13 NOTE — ED Provider Notes (Addendum)
Nursing reports they think that they are concerned about alcohol withdrawal for the patient.  She reports that she drinks two 40s a day.  Has had alcohol withdrawals in the past.  Last drink was yesterday in the afternoon.  On exam she is walking about the room.  Appears to be alert and oriented.  Does have tongue fasciculations and tremor and appears anxious.  Ordered for scheduled Ativan per CIWA protocol.   Rondel Baton, MD 06/13/23 [phone]
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
## 2023-06-13 NOTE — Consult Note (Signed)
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 (4 mg total) by mouth every 8 (eight) hours as needed for nausea or vomiting. (Patient not taking: Reported on 05/02/2023), Disp:  8 tablet, Rfl: 0   pantoprazole (PROTONIX) 40 MG tablet, Take 1 tablet (40 mg total) by mouth daily. (Patient not taking: Reported on 06/12/2023), Disp: 30 tablet, Rfl: 0   predniSONE (DELTASONE) 20 MG tablet, Take 2 tablets (40 mg total) by mouth daily with breakfast. (Patient not taking: Reported on 06/12/2023), Disp: 10 tablet, Rfl: 0   sucralfate (CARAFATE) 1 g tablet, Take 1 tablet (1 g total) by mouth 4 (four) times daily -  with meals and at bedtime. (Patient not taking: Reported on 05/02/2023), Disp: 90 tablet, Rfl: 0   tiZANidine (ZANAFLEX) 2 MG tablet, Take 2 mg by mouth at bedtime. (Patient not taking: Reported on 05/02/2023), Disp: , Rfl:    zonisamide (ZONEGRAN) 100 MG capsule, Take 2 capsules (200 mg total) by mouth daily. (Patient not taking: Reported on 06/12/2023), Disp: 30 capsule, Rfl: 3  Allergies: Allergies  Allergen Reactions   Iron Dextran Shortness Of Breath and Anxiety   Aspirin Nausea And Vomiting and Other (See Comments)    Ok to  take tylenol or ibuprofen    Penicillins Other (See Comments)    Unknown reaction from childhood    Chales Abrahams, NP

## 2023-06-13 NOTE — ED Notes (Signed)
Nurse was able to obtain vital signs

## 2023-06-13 NOTE — ED Notes (Addendum)
Patient keeps trying to move my chair and computer from in front of the door to leave the room.
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
## 2023-06-13 NOTE — ED Notes (Signed)
Patient is fighting the medicine and continues to try to leave.

## 2023-06-13 NOTE — Progress Notes (Signed)
TOC CSW received consult for substance use placement. Pt can not be place for substance use through the ED.  CSW spoke with pt's daughter she reports she did not asked for pt to be placed for substance use , she also said pt is not willing to get substance use treatment. CSW explained pt has to be willing to receive substance use treatment to get placement in [facility]. Pt's daughter expressed her concerns about the pt lacking capacity. Pt's daughter stated she is out of town and will be back tomorrow. No further TOC needs, TOC sign off  [name] M.[name], MSW, LCSWA [facility]  Transitions of Care Clinical Social Worker I Direct Dial: [phone]  Fax: [phone] [email or web address]
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
## 2023-06-13 NOTE — ED Notes (Signed)
Patient starting to get sleepy but wont lay down in the bed states that is not her bed.

## 2023-06-13 NOTE — ED Notes (Signed)
Patient has become more agitated since I arrived at 3:00 pm.

## 2023-06-14 DIAGNOSIS — F1910 Other psychoactive substance abuse, uncomplicated: Secondary | ICD-10-CM | POA: Diagnosis not present

## 2023-06-14 MED ORDER — DIVALPROEX SODIUM 500 MG PO DR TAB
500.0000 mg | DELAYED_RELEASE_TABLET | Freq: Every morning | ORAL | Status: DC
  Administered 2023-06-15 – 2023-06-16 (×2): 500 mg via ORAL
  Filled 2023-06-14 (×2): qty 1

## 2023-06-14 MED ORDER — DIVALPROEX SODIUM 500 MG PO DR TAB
750.0000 mg | DELAYED_RELEASE_TABLET | Freq: Every day | ORAL | Status: DC
  Administered 2023-06-14 – 2023-06-15 (×2): 750 mg via ORAL
  Filled 2023-06-14 (×2): qty 1

## 2023-06-14 MED ORDER — OLANZAPINE 5 MG PO TBDP: 5.0000 mg | ORAL_TABLET | Freq: Three times a day (TID) | ORAL | Status: DC | PRN

## 2023-06-14 MED ORDER — HALOPERIDOL LACTATE 5 MG/ML IJ SOLN
5.0000 mg | Freq: Once | INTRAMUSCULAR | Status: AC
  Administered 2023-06-14: 5 mg via INTRAMUSCULAR
  Filled 2023-06-14: qty 1

## 2023-06-14 NOTE — ED Provider Notes (Signed)
Patient is becoming increasingly agitated.  She was given p.o. Ativan with no help.  She is now spitting on staff.  She will be given IM Haldol.   Pricilla Loveless, MD 06/14/23 [phone]

## 2023-06-14 NOTE — Progress Notes (Signed)
Patient has been denied by [facility] due to no appropriate beds available. Patient meets [facility] inpatient criteria per Ophelia Shoulder, NP. Patient has been faxed out to the following facilities:   [facility] - Request [address], [place] [phone]--ZHYQM-VHQION [facility] Health Patient PlacementPending - Request [facility], [facility] [phone]--[facility] - Request [address] [place] [facility] [phone]--OVFIE-PPI [facility] HealthPending - Request Sent--[address] [place] [phone]--DUKGU-RKY Luvenia [facility] - Request Sent--[address], [place] [phone]--[facility] HealthPending - Request [address] [place] [phone]--[facility] - Request Sent--[address] [place] [phone]--FBPZW-CHENI [facility] - Request Sent--[address] [place] [phone]--JKDTO-IZTIWPYK [place] - Request [address] [place] [phone]--OXBDZ-HGDJMEQ [facility] HealthPending - Request [address], [place] [phone]--XKGYJ-EHUDJSH [facility] - Request [address] [place], [place] [phone]--MCNOB-SJGGEZ HealthPending - Request Sent--[address] [place] [phone]--[facility] [place] - Request Sent--[place] [phone]--[facility] [place] - Request Sent--[facility] [address] [place] [phone]--[facility]VC[facility] - Request Sent--[address], [place]  [phone]--ZESPQ[facility]QTMA [facility] - Request Sent--[address] [place] [facility] [phone]--[facility] HealthcarePending - Request [address] [place] [phone]--  Damita Dunnings, MSW, LCSW-A  6:37 PM 06/14/2023
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
## 2023-06-14 NOTE — ED Provider Notes (Signed)
Emergency Medicine Observation Re-evaluation Note  [name] is a 57 y.o. female, seen on rounds today.  Pt initially presented to the ED for complaints of Agitation Currently, the patient is walking around room.  Physical Exam  BP 111/77   Pulse (!) 110   Temp 97.7 F (36.5 C) (Axillary)   Resp 18   Ht 5\' 2"  (1.575 m)   Wt 52 kg   LMP 01/08/2011   SpO2 97%   BMI 20.97 kg/m  Physical Exam General: no acute distress Lungs: normal effort Psych: no agitation  ED Course / MDM  EKG:EKG Interpretation Date/Time:  Thursday June 11 2023 17:26:28 EST Ventricular Rate:  113 PR Interval:  139 QRS Duration:  86 QT Interval:  331 QTC Calculation: 454 R Axis:   67  Text Interpretation: Sinus tachycardia Biatrial enlargement Confirmed by Beckey Downing [phone]) on 06/11/2023 5:52:35 PM  I have reviewed the labs performed to date as well as medications administered while in observation.  Recent changes in the last 24 hours include being put on CIWA protocol yesterday.  Plan  Current plan is for psychiatric reassessment, possible D/C.    Pricilla Loveless, MD 06/14/23 [phone]

## 2023-06-14 NOTE — ED Notes (Signed)
Called pharmacy again for Keppra, states will send it down now

## 2023-06-15 DIAGNOSIS — F419 Anxiety disorder, unspecified: Secondary | ICD-10-CM | POA: Diagnosis not present

## 2023-06-15 DIAGNOSIS — F329 Major depressive disorder, single episode, unspecified: Secondary | ICD-10-CM | POA: Diagnosis not present

## 2023-06-15 DIAGNOSIS — F1910 Other psychoactive substance abuse, uncomplicated: Secondary | ICD-10-CM | POA: Diagnosis not present

## 2023-06-15 MED ORDER — ACETAMINOPHEN 325 MG PO TABS
650.0000 mg | ORAL_TABLET | Freq: Four times a day (QID) | ORAL | Status: DC | PRN
  Administered 2023-06-15: 650 mg via ORAL
  Filled 2023-06-15: qty 2

## 2023-06-15 NOTE — ED Notes (Signed)
Tried to place pt on cardiac monitoring. Pt does not want to be on monitor, states that she doesn't want to be hooked up to all the cords.

## 2023-06-15 NOTE — ED Notes (Signed)
Pt was moved to room 37. Pts belongings were placed in locker 37 in [place]. Pt ambulated to room 37.

## 2023-06-15 NOTE — ED Notes (Signed)
Pt ambulated to bathroom, with assistance of sitter

## 2023-06-15 NOTE — ED Provider Notes (Signed)
Emergency Medicine Observation Re-evaluation Note  [name] is a 57 y.o. female, seen on rounds today.  Pt initially presented to the ED for complaints of Agitation Currently, the patient is asleep, resting.  Physical Exam  BP 104/71 (BP Location: Left Arm)   Pulse 91   Temp 97.8 F (36.6 C) (Oral)   Resp 20   Ht 5\' 2"  (1.575 m)   Wt 52 kg   LMP 01/08/2011   SpO2 100%   BMI 20.97 kg/m  Physical Exam General: NAD Cardiac: well perfused Lungs: even and unlabored Psych: no agitation  ED Course / MDM  EKG:EKG Interpretation Date/Time:  Thursday June 11 2023 17:26:28 EST Ventricular Rate:  113 PR Interval:  139 QRS Duration:  86 QT Interval:  331 QTC Calculation: 454 R Axis:   67  Text Interpretation: Sinus tachycardia Biatrial enlargement Confirmed by Beckey Downing [phone]) on 06/11/2023 5:52:35 PM  I have reviewed the labs performed to date as well as medications administered while in observation.  Recent changes in the last 24 hours include pt under IVC, seen by psychiatry and recommendations included inpatient.  Plan  Current plan is for inpatient psychiatric admission.    Ernie Avena, MD 06/15/23 [phone]

## 2023-06-15 NOTE — ED Notes (Signed)
Pt was requesting tylenol for chronic back pain. Order obtained. Pt calm and cooperative with staff

## 2023-06-15 NOTE — ED Notes (Signed)
Pt. Eating 8pm snack. 3 cheese slices 4 crackers packs with Ice water.

## 2023-06-15 NOTE — ED Notes (Signed)
Nicotine patch removed from pts right upper arm

## 2023-06-15 NOTE — Consult Note (Signed)
[facility] Psychiatric Consult Initial  Patient Name: .[name]  MRN: [id number]  DOB: 1965/10/25  Consult Order details:  Orders (From admission, onward)     Start     Ordered   06/12/23 0826  CONSULT TO CALL ACT TEAM       Ordering Provider: Pricilla Loveless, MD  Provider:  (Not yet assigned)  Question Answer Comment  Place call to: ACT   Reason for Consult Admit      06/12/23 0825   06/11/23 1913  CONSULT TO CALL ACT TEAM       Ordering Provider: Durwin Glaze, MD  Provider:  (Not yet assigned)  Question:  Reason for Consult?  Answer:  IVC, agitation   06/11/23 1912             Mode of Visit: In person    Psychiatry Consult Evaluation  Service Date: June 15, 2023 LOS:  LOS: 0 days  Chief Complaint agitation  Primary Psychiatric Diagnoses  Polysubstance abuse 2.   MDD 3.   Anxiety  Assessment  [name] is a 57 y.o. female admitted: Presented to the EDfor 06/11/2023  5:11 PM for agitation due to alcohol intoxication. She carries the psychiatric diagnoses of MDD and has a past medical history of  diabetes and seizure disorder.    Patient seen today several times coming to the Nursing station window to speak with Nursing staff.  Patient has been calm but very quick to getting angry.  Seen in her room for evaluation.  Patient when asked about however, patient asked about her use of Alcohol became upset stating"  you all ask the same stupid question.  I drink Beer and you all are not going to stop me from drinking.  If I leave tonight I might not drink tonight but tomorrow I may drink"  insight in her Mental illness and using Alcohol and consequences are lacking.  She does not see any effect alcohol has in her behavior.  Patient constantly walking to the Nursing station window to ask for one thing or another.  Patient has been calm today but refusing some care.  She refused to be under Cardiac monitor.  Patient is on CIWA Protocol with Ativan coverage.   Patient does not believe inpatient hospitalization will stop her from drinking. We will reevaluate plan of care tomorrow and determine appropriate disposition while we also seek bed placement.  Her records has been faxed out to hospitals for bed placement.   She is taking Depakote for mood stabilization.  Repeat level to be done later as she just started taking Depakote two days ago.  Patient Denies SI/HI/AVH.  Please see plan below for detailed recommendations.   Diagnoses:  Active Hospital problems: Principal Problem:   Schizoaffective disorder (HCC) Active Problems:   Alcohol abuse with intoxication (HCC)   Cocaine abuse (HCC)   Behavior concern in adult    Plan   ## Psychiatric Medication Recommendations:  -Recommend overnight observation to monitor patient behavior and detox -Ativan detox in place, patient BAL 109 -Recommend safety precautions, standard -Patient will be reevaluated in am for possible discharge.  She does not believe inpatient Psychiatry hospitalization will stop her from drinking alcohol.    ## Medical Decision Making Capacity:  Patient is her own legal guardian   ## Further Work-up:  EKG -- most recent EKG on 06/11/23 had QtC of 454 -- Pertinent labwork reviewed earlier this admission includes: CMP, BMP, UDS, EKG, CIWA scores   ##  Disposition:-- Recommend overnight observation to monitor patient behavior and detox  ## Behavioral / Environmental: -Delirium Precautions: Delirium Interventions for Nursing and Staff: - RN to open blinds every AM. - To Bedside: Glasses, hearing aide, and pt's own shoes. Make available to patients. when possible and encourage use. - Encourage po fluids when appropriate, keep fluids within reach. - OOB to chair with meals. - Passive ROM exercises to all extremities with AM & PM care. - RN to assess orientation to person, time and place QAM and PRN. - Recommend extended visitation hours with familiar family/friends as feasible. -  Staff to minimize disturbances at night. Turn off television when pt asleep or when not in use.,  Patients with borderline personality traits/disorder often use the language of physical pain to communicate both physical and emotional suffering. It is important to address pain complaints as they arise and attempt to identify an etiology, either organic or psychiatric. In patients with chronic pain, it is important to have a discussion with the patient about expectations about pain control., Recommend using specific terminology regarding PNES, i.e. call the episodes "non-epileptic seizures" rather than "pseudoseizures" as the latter insinuates "fake" or "feigned" symptoms, when the events are a very real experience to the patient and are a physical, non-volitional, manifestation of fear, pain and anxiety. , or To minimize splitting of staff, assign one staff person to communicate all information from the team when feasible.    ## Safety and Observation Level:  - Based on my clinical evaluation, I estimate the patient to be at low risk of self harm in the current setting. - At this time, we recommend  routine. This decision is based on my review of the chart including patient's history and current presentation, interview of the patient, mental status examination, and consideration of suicide risk including evaluating suicidal ideation, plan, intent, suicidal or self-harm behaviors, risk factors, and protective factors. This judgment is based on our ability to directly address suicide risk, implement suicide prevention strategies, and develop a safety plan while the patient is in the clinical setting. Please contact our team if there is a concern that risk level has changed.  CSSR Risk Category:C-SSRS RISK CATEGORY: No Risk  Suicide Risk Assessment: Patient has following modifiable risk factors for suicide: untreated depression, recklessness, and medication noncompliance, which we are addressing by Recommend  overnight observation to monitor patient behavior and detox. Patient has following non-modifiable or demographic risk factors for suicide: separation or divorce, history of self harm behavior, and psychiatric hospitalization Patient has the following protective factors against suicide: Supportive family and Supportive friends  Thank you for this consult request. Recommendations have been communicated to the primary team.  We will Recommend overnight observation to monitor patient behavior and detox at this time.   Earney Navy, NP       History of Present Illness  Relevant Aspects of Hospital ED Course:   Patient seen today several times coming to the Nursing station window to speak with Nursing staff.  Patient has been calm but very quick to getting angry.  Seen in her room for evaluation.  Patient when asked about however, patient asked about her use of Alcohol became upset stating"  you all ask the same stupid question.  I drink Beer and you all are not going to stop me from drinking.  If I leave tonight I might not drink tonight but tomorrow I may drink"  insight in her Mental illness and using Alcohol and consequences are lacking.  She does not see any effect alcohol has in her behavior.  Patient constantly walking to the Nursing station window to ask for one thing or another.  Patient has been calm today but refusing some care.  She refused to be under Cardiac monitor.  Patient is on CIWA Protocol with Ativan coverage.  Patient does not believe inpatient hospitalization will stop her from drinking. We will reevaluate plan of care tomorrow and determine appropriate disposition while we also seek bed placement.  Her records has been faxed out to hospitals for bed placement.   She is taking Depakote for mood stabilization.  Repeat level to be done later as she just started taking Depakote two days ago.  Patient denies SI/HI/AVH.  Collateral information:  Na- Daughter already given collateral  information.  Review of Systems  Constitutional: Negative.   HENT: Negative.    Eyes: Negative.   Respiratory: Negative.    Cardiovascular: Negative.   Gastrointestinal: Negative.   Genitourinary: Negative.   Musculoskeletal: Negative.   Skin: Negative.   Neurological: Negative.   Endo/Heme/Allergies: Negative.   Psychiatric/Behavioral:  Positive for substance abuse.        Alcoholism     Psychiatric and Social History  Psychiatric History:  Information collected from patient   Prev Dx/Sx: MDD, polysubstance abuse Current Psych Provider: None Home Meds (current): None Previous Med Trials: Prozac, does not take anymore Therapy: Past history  Prior Psych Hospitalization: Yes Prior Self Harm: Yes Prior Violence: Yes  Family Psych History: Patient does not know Family Hx suicide: Patient denies  Social History:  Developmental Hx: Patient appears to be appropriate developmental age but Physically patient appears older than stated age. Educational Hx: High school Occupational Hx: Unemployed Legal Hx: None per patient Living Situation: Lives with brother Spiritual Hx: That this Access to weapons/lethal means: None  Substance History Alcohol: Yes Type of alcohol Beer Last Drink yesterday Number of drinks per day 1 to 2 (24 ounces) 3 times a week History of alcohol withdrawal seizures patient denies History of DT's patient denies Tobacco: Yes Illicit drugs: Yes cocaine Prescription drug abuse: Patient denies Rehab hx: Patient denies  Exam Findings  Physical Exam:  Vital Signs:  Temp:  [97.8 F (36.6 C)-98.5 F (36.9 C)] 98.5 F (36.9 C) (12/23 1306) Pulse Rate:  [54-108] 108 (12/23 1306) Resp:  [16-20] 16 (12/23 1306) BP: (97-128)/(60-75) 118/72 (12/23 1306) SpO2:  [79 %-100 %] 99 % (12/23 1306) Blood pressure 118/72, pulse (!) 108, temperature 98.5 F (36.9 C), temperature source Oral, resp. rate 16, height 5\' 2"  (1.575 m), weight 52 kg, last menstrual  period 01/08/2011, SpO2 99%. Body mass index is 20.97 kg/m.  Physical Exam Vitals (appears older than stated age.) and nursing note reviewed. Exam conducted with a chaperone present.  HENT:     Nose: Nose normal.  Cardiovascular:     Rate and Rhythm: Tachycardia present.  Pulmonary:     Effort: Pulmonary effort is normal.  Musculoskeletal:        General: Normal range of motion.  Skin:    General: Skin is dry.  Neurological:     Mental Status: She is alert and oriented to person, place, and time.  Psychiatric:        Attention and Perception: Attention normal.        Mood and Affect: Mood is anxious. Affect is angry.        Speech: Speech normal.        Behavior: Behavior is cooperative.  Thought Content: Thought content normal.        Cognition and Memory: Memory normal.        Judgment: Judgment is impulsive and inappropriate.     Mental Status Exam: General Appearance: Casual  Orientation:  Full (Time, Place, and Person)  Memory:  Immediate;   Fair Remote;   Fair  Concentration:  Concentration: Fair and Attention Span: Fair  Recall:  Fair  Attention  Fair  Eye Contact:  Good  Speech:  Clear and Coherent  Language:  Fair  Volume:  Normal  Mood: euthymic  Affect:  Appropriate  Thought Process:  Coherent  Thought Content:  WDL  Suicidal Thoughts:  No  Homicidal Thoughts:  No  Judgement:  Fair  Insight:  Fair  Psychomotor Activity:  Normal and Tremor  Akathisia:  No  Fund of Knowledge:  Fair      Assets:  Manufacturing systems engineer Desire for Improvement Housing Social Support  Cognition:  WNL  ADL's:  Intact  AIMS (if indicated):        Other History   These have been pulled in through the EMR, reviewed, and updated if appropriate.  Family History:  The patient's family history includes COPD in an other family member; Diabetes in her mother; Heart attack in her father and sister; Heart disease in her mother; Hypertension in her mother.  Medical  History: Past Medical History:  Diagnosis Date   Abdominal pain    Accidental drug overdose April 2013   Anxiety    Atrial fibrillation (HCC) 09/29/11   converted spontaneously   Chronic back pain    Chronic knee pain    Chronic nausea    Chronic pain    COPD (chronic obstructive pulmonary disease) (HCC)    Depression    Diabetes mellitus    states her doctor took her off all DM meds in past month   Diabetic neuropathy (HCC)    Dyspnea    with exertion    GERD (gastroesophageal reflux disease)    Headache(784.0)    migraines    HTN (hypertension)    not on meds since in a year    Hyperlipidemia    Hypothyroidism    not on meds in a while    Mental disorder    Bipolar and schizophrenic   Nausea and vomiting 01/02/2023   Requires supplemental oxygen    as needed per patient    Schizophrenia (HCC)    Schizophrenia, acute (HCC) 11/13/2017   Tobacco abuse     Surgical History: Past Surgical History:  Procedure Laterality Date   ABDOMINAL HYSTERECTOMY     BLADDER SUSPENSION  03/04/2011   Procedure: [facility] PROCEDURE;  Surgeon: Jonna Coup MacDiarmid;  Location: [facility];  Service: Urology;  Laterality: N/A;   BOWEL RESECTION N/A 04/18/2022   Procedure: SMALL BOWEL RESECTION;  Surgeon: Harriette Bouillon, MD;  Location: [facility] OR;  Service: General;  Laterality: N/A;   CYSTOCELE REPAIR  03/04/2011   Procedure: ANTERIOR REPAIR (CYSTOCELE);  Surgeon: Martina Sinner;  Location: [facility];  Service: Urology;  Laterality: N/A;   CYSTOSCOPY  03/04/2011   Procedure: CYSTOSCOPY;  Surgeon: Jonna Coup MacDiarmid;  Location: [facility];  Service: Urology;  Laterality: N/A;   ESOPHAGOGASTRODUODENOSCOPY (EGD) WITH PROPOFOL N/A 05/12/2017   Procedure: ESOPHAGOGASTRODUODENOSCOPY (EGD) WITH PROPOFOL;  Surgeon: Ovidio Kin, MD;  [facility];  Service: General;  Laterality: N/A;   GASTRIC ROUX-EN-Y N/A 03/25/2016   Procedure: LAPAROSCOPIC ROUX-EN-Y GASTRIC BYPASS WITH UPPER ENDOSCOPY;  Surgeon:  Glenna Fellows, MD;  Location: [facility];  Service: General;  Laterality: N/A;   KNEE SURGERY     LAPAROSCOPIC ASSISTED VAGINAL HYSTERECTOMY  03/04/2011   Procedure: LAPAROSCOPIC ASSISTED VAGINAL HYSTERECTOMY;  Surgeon: Jeani Hawking, MD;  Location: [facility];  Service: Gynecology;  Laterality: N/A;   LAPAROTOMY N/A 04/18/2022   Procedure: EXPLORATORY LAPAROTOMY;  Surgeon: Harriette Bouillon, MD;  Location: [facility];  Service: General;  Laterality: N/A;   LAPAROTOMY N/A 04/24/2022   Procedure: BRING BACK EXPLORATORY LAPAROTOMY;  Surgeon: Violeta Gelinas, MD;  Location: [facility] OR;  Service: General;  Laterality: N/A;   TOTAL HIP ARTHROPLASTY Right 08/27/2022   Procedure: TOTAL HIP ARTHROPLASTY;  Surgeon: Joen Laura, MD;  Location: [facility];  Service: Orthopedics;  Laterality: Right;     Medications:   Current Facility-Administered Medications:    albuterol (VENTOLIN HFA) 108 (90 Base) MCG/ACT inhaler 2 puff, 2 puff, Inhalation, Q4H PRN, Horton, Mayer Masker, MD, 2 puff at 06/13/23 1723   ARIPiprazole (ABILIFY) tablet 10 mg, 10 mg, Oral, Daily, Motley-Mangrum, Jadeka A, PMHNP, 10 mg at 06/15/23 0935   divalproex (DEPAKOTE) DR tablet 500 mg, 500 mg, Oral, q AM, Ophelia Shoulder E, NP, 500 mg at 06/15/23 0934   divalproex (DEPAKOTE) DR tablet 750 mg, 750 mg, Oral, QHS, Ophelia Shoulder E, NP, 750 mg at 06/14/23 2215   folic acid (FOLVITE) tablet 1 mg, 1 mg, Oral, Daily, Durwin Glaze, MD, 1 mg at 06/15/23 0935   ipratropium-albuterol (DUONEB) 0.5-2.5 (3) MG/3ML nebulizer solution 3 mL, 3 mL, Nebulization, Once, Durwin Glaze, MD   levETIRAcetam (KEPPRA XR) 24 hr tablet 500 mg, 500 mg, Oral, BID, Motley-Mangrum, Jadeka A, PMHNP, 500 mg at 06/15/23 0816   LORazepam (ATIVAN) tablet 0-4 mg, 0-4 mg, Oral, Q6H, 1 mg at 06/14/23 1318 **FOLLOWED BY** LORazepam (ATIVAN) tablet 0-4 mg, 0-4 mg, Oral, Q12H, Rondel Baton, MD   metFORMIN (GLUCOPHAGE-XR) 24 hr tablet 750 mg, 750 mg, Oral, Daily, Motley-Mangrum, Jadeka A, PMHNP,  750 mg at 06/15/23 0934   naproxen (NAPROSYN) tablet 500 mg, 500 mg, Oral, BID WC, Pricilla Loveless, MD, 500 mg at 06/15/23 1614   nicotine (NICODERM CQ - dosed in mg/24 hours) patch 21 mg, 21 mg, Transdermal, Daily, Pricilla Loveless, MD, 21 mg at 06/15/23 7829   nicotine polacrilex (NICORETTE) gum 2 mg, 2 mg, Oral, PRN, Pricilla Loveless, MD, 2 mg at 06/12/23 2015   OLANZapine zydis (ZYPREXA) disintegrating tablet 5 mg, 5 mg, Oral, Q8H PRN, Pricilla Loveless, MD   thiamine (VITAMIN B1) injection 100 mg, 100 mg, Intramuscular, Once, Motley-Mangrum, Jadeka A, PMHNP   thiamine (VITAMIN B1) tablet 100 mg, 100 mg, Oral, Daily, Motley-Mangrum, Jadeka A, PMHNP, 100 mg at 06/15/23 5621  Current Outpatient Medications:    albuterol (VENTOLIN HFA) 108 (90 Base) MCG/ACT inhaler, Inhale 1-2 puffs into the lungs every 6 (six) hours as needed for wheezing or shortness of breath., Disp: 1 each, Rfl: 0   budesonide-formoterol (SYMBICORT) 160-4.5 MCG/ACT inhaler, Inhale 2 puffs into the lungs 2 (two) times daily., Disp: 10.2 g, Rfl: 2   divalproex (DEPAKOTE) 500 MG DR tablet, Take 1 tablet (500 mg total) by mouth every 12 (twelve) hours., Disp: 60 tablet, Rfl: 3   fluticasone-salmeterol (ADVAIR) 250-50 MCG/ACT AEPB, Inhale 1 puff into the lungs in the morning and at bedtime., Disp: , Rfl:    ipratropium-albuterol (DUONEB) 0.5-2.5 (3) MG/3ML SOLN, Take 3 mLs by nebulization every 6 (six) hours as needed (for shortness of breath or  wheezing)., Disp: , Rfl:    levETIRAcetam (KEPPRA XR) 500 MG 24 hr tablet, Take 1 tablet (500 mg total) by mouth 2 (two) times daily., Disp: 60 tablet, Rfl: 3   OXYGEN, Inhale 4-5 L/min into the lungs as needed (for COPD-related symptoms)., Disp: , Rfl:    ARIPiprazole (ABILIFY) 10 MG tablet, Take 1 tablet (10 mg total) by mouth daily. (Patient not taking: Reported on 05/02/2023), Disp: 30 tablet, Rfl: 3   cyanocobalamin (VITAMIN B12) 1000 MCG tablet, Take 1 tablet (1,000 mcg total) by mouth  daily. (Patient not taking: Reported on 06/12/2023), Disp: 130 tablet, Rfl: 0   famotidine (PEPCID) 20 MG tablet, Take 1 tablet (20 mg total) by mouth daily. (Patient not taking: Reported on 05/02/2023), Disp: 30 tablet, Rfl: 0   ferrous sulfate 325 (65 FE) MG tablet, Take 1 tablet (325 mg total) by mouth every other day. (Patient not taking: Reported on 05/02/2023), Disp: 100 tablet, Rfl: 0   furosemide (LASIX) 20 MG tablet, Take 1 tablet (20 mg total) by mouth daily. (Patient not taking: Reported on 05/02/2023), Disp: 2 tablet, Rfl: 0   gabapentin (NEURONTIN) 300 MG capsule, Take 1 capsule (300 mg total) by mouth 3 (three) times daily. (Patient not taking: Reported on 05/02/2023), Disp: 90 capsule, Rfl: 3   hyoscyamine (LEVSIN/SL) 0.125 MG SL tablet, Place 1 tablet (0.125 mg total) under the tongue every 4 (four) hours as needed (pain). Up to 1.25 mg daily (Patient not taking: Reported on 05/02/2023), Disp: 20 tablet, Rfl: 0   ondansetron (ZOFRAN) 4 MG tablet, Take 1 tablet (4 mg total) by mouth every 6 (six) hours. (Patient not taking: Reported on 05/02/2023), Disp: 12 tablet, Rfl: 0   ondansetron (ZOFRAN-ODT) 4 MG disintegrating tablet, Take 1 tablet (4 mg total) by mouth every 8 (eight) hours as needed for nausea or vomiting. (Patient not taking: Reported on 05/02/2023), Disp: 8 tablet, Rfl: 0   pantoprazole (PROTONIX) 40 MG tablet, Take 1 tablet (40 mg total) by mouth daily. (Patient not taking: Reported on 06/12/2023), Disp: 30 tablet, Rfl: 0   predniSONE (DELTASONE) 20 MG tablet, Take 2 tablets (40 mg total) by mouth daily with breakfast. (Patient not taking: Reported on 06/12/2023), Disp: 10 tablet, Rfl: 0   sucralfate (CARAFATE) 1 g tablet, Take 1 tablet (1 g total) by mouth 4 (four) times daily -  with meals and at bedtime. (Patient not taking: Reported on 05/02/2023), Disp: 90 tablet, Rfl: 0   tiZANidine (ZANAFLEX) 2 MG tablet, Take 2 mg by mouth at bedtime. (Patient not taking: Reported on  05/02/2023), Disp: , Rfl:    zonisamide (ZONEGRAN) 100 MG capsule, Take 2 capsules (200 mg total) by mouth daily. (Patient not taking: Reported on 06/12/2023), Disp: 30 capsule, Rfl: 3  Allergies: Allergies  Allergen Reactions   Iron Dextran Shortness Of Breath and Anxiety   Aspirin Nausea And Vomiting and Other (See Comments)    Ok to take tylenol or ibuprofen    Penicillins Other (See Comments)    Unknown reaction from childhood    Earney Navy, NP-PMHNP-BC

## 2023-06-15 NOTE — Progress Notes (Signed)
LCSW Progress Note  [id number]   [name]  06/15/2023  1:50 PM  Description:   Inpatient Psychiatric Referral  Patient was recommended inpatient per Ophelia Shoulder NP There are no available beds at [facility], per [facility] Rona Ravens RN Patient was referred to the following out of network facilities:    [facility] Provider Address Phone Fax  [facility] [address] [place] [address] [phone]  [facility] Patient Placement [facility], [place] [phone]  [facility] [address] [place] [address] [phone]  [facility] [address], [place] [address] [phone]  [facility] EFAX [address] [place], [place] [phone]  [facility] [address] [place] [address] [phone]  [facility] [address] [place] [address] [phone]  [facility] [address] [place] [address] [phone]  [facility] [address] [place] [address] [phone]  [facility] [address] [place] [address] [phone]  [facility] [address] [place], [place] [address] [phone]  [facility] [address] [place] [address] [phone]  [facility] [address] [place] [address] [phone]  [facility] [address] [place] [place] [address] [phone]  [facility] [address] [place] [address] [phone]  [facility] [address], [place] [address] [phone]  [facility] [address] [place] [address] [phone]      Situation ongoing, CSW to continue following and update chart as more information becomes available.      [place] Robey Massmann, MSW, LCSW  06/15/2023 1:50 PM

## 2023-06-16 DIAGNOSIS — F329 Major depressive disorder, single episode, unspecified: Secondary | ICD-10-CM

## 2023-06-16 DIAGNOSIS — F1910 Other psychoactive substance abuse, uncomplicated: Secondary | ICD-10-CM | POA: Diagnosis not present

## 2023-06-16 DIAGNOSIS — F419 Anxiety disorder, unspecified: Secondary | ICD-10-CM

## 2023-06-16 MED ORDER — NICOTINE POLACRILEX 2 MG MT GUM: 2.0000 mg | CHEWING_GUM | OROMUCOSAL | 0 refills | Status: DC | PRN

## 2023-06-16 MED ORDER — METFORMIN HCL ER 750 MG PO TB24: 750.0000 mg | ORAL_TABLET | Freq: Every day | ORAL | 0 refills | Status: DC

## 2023-06-16 MED ORDER — NICOTINE 21 MG/24HR TD PT24: 21.0000 mg | MEDICATED_PATCH | Freq: Every day | TRANSDERMAL | 0 refills | Status: DC

## 2023-06-16 NOTE — ED Notes (Signed)
[facility] gave pt information for outpatient mental health and substance abuse treatment services at her discharge.   Jacquelynn Cree, [facility]  06/16/23

## 2023-06-16 NOTE — ED Provider Notes (Signed)
Emergency Medicine Observation Re-evaluation Note  [name] is a 57 y.o. female, seen on rounds today.  Pt initially presented to the ED for complaints of Agitation Currently, the patient is resting.  Physical Exam  BP 121/82 (BP Location: Right Arm)   Pulse (!) 104   Temp 97.7 F (36.5 C) (Oral)   Resp 14   Ht 1.575 m (5\' 2" )   Wt 52 kg   LMP 01/08/2011   SpO2 100%   BMI 20.97 kg/m  Physical Exam   ED Course / MDM  EKG:EKG Interpretation Date/Time:  Thursday June 11 2023 17:26:28 EST Ventricular Rate:  113 PR Interval:  139 QRS Duration:  86 QT Interval:  331 QTC Calculation: 454 R Axis:   67  Text Interpretation: Sinus tachycardia Biatrial enlargement Confirmed by Beckey Downing [id number]) on 06/11/2023 5:52:35 PM  I have reviewed the labs performed to date as well as medications administered while in observation.  Recent changes in the last 24 hours include none.  Plan  Current plan is for placement.    Lorre Nick, MD 06/16/23 [id number]

## 2023-06-16 NOTE — ED Notes (Signed)
Spoke with pts daughter. Daughter states she can not pick mother up at discharge and to give her a bus pass to go home.

## 2023-06-16 NOTE — Consult Note (Signed)
[facility] Psychiatric Consult Initial  Patient Name: .[name]  MRN: [id number]  DOB: 1966-06-19  Consult Order details:  Orders (From admission, onward)     Start     Ordered   06/12/23 0826  CONSULT TO CALL ACT TEAM       Ordering Provider: Pricilla Loveless, MD  Provider:  (Not yet assigned)  Question Answer Comment  Place call to: ACT   Reason for Consult Admit      06/12/23 0825   06/11/23 1913  CONSULT TO CALL ACT TEAM       Ordering Provider: Durwin Glaze, MD  Provider:  (Not yet assigned)  Question:  Reason for Consult?  Answer:  IVC, agitation   06/11/23 1912             Mode of Visit: In person    Psychiatry Consult Evaluation  Service Date: June 16, 2023 LOS:  LOS: 0 days  Chief Complaint agitation  Primary Psychiatric Diagnoses  Polysubstance abuse 2.   MDD 3.   Anxiety  Assessment  [name] is a 57 y.o. female admitted: Presented to the EDfor 06/11/2023  5:11 PM for agitation due to alcohol intoxication. She carries the psychiatric diagnoses of MDD and has a past medical history of  diabetes and seizure disorder.    Patient seen today calm and cooperative.  Patient sat down in her room and engaged in meaningful conversation.  We reviewed the negative consequences of his drinking Alcohol and the need to stop at this time.  We also reviewed the need to take her Psychotropic Medications and to see her outpatient Psychiatrist as ordered.  Patient verbalizes understanding.  Patient acknowledges that she drinks a lot of alcohol and that she gets easily intoxicated and does things that lands her in the hospital.  We reviewed safety plans-call 911 or [phone] for Mental health Emergency including but not limited suicide ideation or thought.  Patient is advised to utilize [facility] health facility for Mental healthcare.  Resources offered to patient at this time. Patient is AA female, 57 years old who came in to the ER intoxicated brought in  by the Police.  On arrival patient was verbally abusive tom staff and was Medicated with Geodon.  Today patient had a shower, is calm and cooperative.  Patient plans to go stay with her husband who prevents her from drinking Alcohol.  Patient denies SI/HI/AVH and  she is Psychiatrically cleared. Please see plan below for detailed recommendations.   Diagnoses:  Active Hospital problems: Principal Problem:   Schizoaffective disorder (HCC) Active Problems:   Alcohol abuse with intoxication (HCC)   Cocaine abuse (HCC)   Behavior concern in adult    Plan   ## Psychiatric Medication Recommendations:  -Psychiatrically cleared for discharge home.   ## Medical Decision Making Capacity:  Patient is her own legal guardian   ## Further Work-up: NA   ## Disposition:-- Psychiatrically cleared.   ## Safety and Observation Level:  - Based on my clinical evaluation, I estimate the patient to be at low risk of self harm in the current setting. - At this time, we recommend  routine. This decision is based on my review of the chart including patient's history and current presentation, interview of the patient, mental status examination, and consideration of suicide risk including evaluating suicidal ideation, plan, intent, suicidal or self-harm behaviors, risk factors, and protective factors. This judgment is based on our ability to directly address suicide risk,  implement suicide prevention strategies, and develop a safety plan while the patient is in the clinical setting. Please contact our team if there is a concern that risk level has changed.  CSSR Risk Category:C-SSRS RISK CATEGORY: No Risk  Suicide Risk Assessment: Patient has following modifiable risk factors for suicide: untreated depression, recklessness, and medication noncompliance, which we are addressing by encouraging patient to join [employer], Seek outpatient alcohol treatment.  Patient has following non-modifiable or demographic risk  factors for suicide: separation or divorce, history of self harm behavior, and psychiatric hospitalization Patient has the following protective factors against suicide: Supportive family and Supportive friends  Thank you for this consult request. Recommendations have been communicated to the primary team.  We will Psychiatrically clear patient for discharge.   Earney Navy, NP-PMHNP-BC       History of Present Illness  Relevant Aspects of Hospital ED Course:   .Patient seen today calm and cooperative.  Patient sat down in her room and engaged in meaningful conversation.  We reviewed the negative consequences of his drinking Alcohol and the need to stop at this time.  We also reviewed the need to take her Psychotropic Medications and to see her outpatient Psychiatrist as ordered.  Patient verbalizes understanding.  Patient acknowledges that she drinks a lot of alcohol and that she gets easily intoxicated and does things that lands her in the hospital.  We reviewed safety plans-call 911 or 988 for Mental health Emergency including but not limited suicide ideation or thought.  Patient is advised to utilize [facility] health facility for Mental healthcare.  Resources offered to patient at this time. Patient is AA female, 57 years old who came in to the ER intoxicated brought in by the Police.  On arrival patient was verbally abusive tom staff and was Medicated with Geodon.  Today patient had a shower, is calm and cooperative.  Patient plans to go stay with her husband who prevents her from drinking Alcohol.  Patient denies SI/HI/AVH and hs is Psychiatrically cleared.    Collateral information:  Na- Daughter already given collateral information.  Review of Systems  Constitutional: Negative.   HENT: Negative.    Eyes: Negative.   Respiratory: Negative.    Cardiovascular: Negative.   Gastrointestinal: Negative.   Genitourinary: Negative.   Musculoskeletal: Negative.   Skin:  Negative.   Neurological: Negative.   Endo/Heme/Allergies: Negative.   Psychiatric/Behavioral:  Positive for substance abuse.        Alcoholism     Psychiatric and Social History  Psychiatric History:  Information collected from patient   Prev Dx/Sx: MDD, polysubstance abuse Current Psych Provider: None Home Meds (current): None Previous Med Trials: Prozac, does not take anymore Therapy: Past history  Prior Psych Hospitalization: Yes Prior Self Harm: Yes Prior Violence: Yes  Family Psych History: Patient does not know Family Hx suicide: Patient denies  Social History:  Developmental Hx: Patient appears to be appropriate developmental age but Physically patient appears older than stated age. Educational Hx: High school Occupational Hx: Unemployed Legal Hx: None per patient Living Situation: Lives with brother Spiritual Hx: That this Access to weapons/lethal means: None  Substance History Alcohol: Yes Type of alcohol Beer Last Drink yesterday Number of drinks per day 1 to 2 (24 ounces) 3 times a week History of alcohol withdrawal seizures patient denies History of DT's patient denies Tobacco: Yes Illicit drugs: Yes cocaine Prescription drug abuse: Patient denies Rehab hx: Patient denies  Exam Findings  Physical Exam:  Vital Signs:  Temp:  [97.7 F (36.5 C)-98.5 F (36.9 C)] 97.7 F (36.5 C) (12/24 0548) Pulse Rate:  [104-113] 104 (12/24 0548) Resp:  [14-18] 14 (12/24 0548) BP: (116-121)/(71-82) 121/82 (12/24 0548) SpO2:  [96 %-100 %] 100 % (12/24 0548) Blood pressure 121/82, pulse (!) 104, temperature 97.7 F (36.5 C), temperature source Oral, resp. rate 14, height 5\' 2"  (1.575 m), weight 52 kg, last menstrual period 01/08/2011, SpO2 100%. Body mass index is 20.97 kg/m.  Physical Exam Vitals (appears older than stated age.) and nursing note reviewed. Exam conducted with a chaperone present.  HENT:     Nose: Nose normal.  Pulmonary:     Effort: Pulmonary  effort is normal.  Musculoskeletal:        General: Normal range of motion.  Skin:    General: Skin is dry.  Neurological:     Mental Status: She is alert and oriented to person, place, and time.  Psychiatric:        Attention and Perception: Attention normal.        Speech: Speech normal.        Behavior: Behavior is cooperative.        Thought Content: Thought content normal.        Cognition and Memory: Cognition and memory normal.     Mental Status Exam: General Appearance: Casual and Neat  Orientation:  Full (Time, Place, and Person)  Memory:  Immediate;   Good Remote;   Good  Concentration:  Concentration: Good and Attention Span: Good  Recall:  Good  Attention  Good  Eye Contact:  Good  Speech:  Clear and Coherent  Language:  Good  Volume:  Normal  Mood: euthymic  Affect:  Appropriate  Thought Process:  Coherent  Thought Content:  WDL  Suicidal Thoughts:  No  Homicidal Thoughts:  No  Judgement:  Good  Insight:  Good  Psychomotor Activity:  Normal  Akathisia:  No  Fund of Knowledge:  Fair      Assets:  Manufacturing systems engineer Desire for Improvement Housing Social Support  Cognition:  WNL  ADL's:  Intact  AIMS (if indicated):        Other History   These have been pulled in through the EMR, reviewed, and updated if appropriate.  Family History:  The patient's family history includes COPD in an other family member; Diabetes in her mother; Heart attack in her father and sister; Heart disease in her mother; Hypertension in her mother.  Medical History: Past Medical History:  Diagnosis Date   Abdominal pain    Accidental drug overdose April 2013   Anxiety    Atrial fibrillation (HCC) 09/29/11   converted spontaneously   Chronic back pain    Chronic knee pain    Chronic nausea    Chronic pain    COPD (chronic obstructive pulmonary disease) (HCC)    Depression    Diabetes mellitus    states her doctor took her off all DM meds in past month   Diabetic  neuropathy (HCC)    Dyspnea    with exertion    GERD (gastroesophageal reflux disease)    Headache(784.0)    migraines    HTN (hypertension)    not on meds since in a year    Hyperlipidemia    Hypothyroidism    not on meds in a while    Mental disorder    Bipolar and schizophrenic   Nausea and vomiting 01/02/2023   Requires supplemental oxygen  as needed per patient    Schizophrenia (HCC)    Schizophrenia, acute (HCC) 11/13/2017   Tobacco abuse     Surgical History: Past Surgical History:  Procedure Laterality Date   ABDOMINAL HYSTERECTOMY     BLADDER SUSPENSION  03/04/2011   Procedure: [facility] PROCEDURE;  Surgeon: Jonna Coup MacDiarmid;  Location: [facility] ORS;  Service: Urology;  Laterality: N/A;   BOWEL RESECTION N/A 04/18/2022   Procedure: SMALL BOWEL RESECTION;  Surgeon: Harriette Bouillon, MD;  Location: [facility] OR;  Service: General;  Laterality: N/A;   CYSTOCELE REPAIR  03/04/2011   Procedure: ANTERIOR REPAIR (CYSTOCELE);  Surgeon: Martina Sinner;  Location: [facility];  Service: Urology;  Laterality: N/A;   CYSTOSCOPY  03/04/2011   Procedure: CYSTOSCOPY;  Surgeon: Jonna Coup MacDiarmid;  Location: [facility];  Service: Urology;  Laterality: N/A;   ESOPHAGOGASTRODUODENOSCOPY (EGD) WITH PROPOFOL N/A 05/12/2017   Procedure: ESOPHAGOGASTRODUODENOSCOPY (EGD) WITH PROPOFOL;  Surgeon: Ovidio Kin, MD;  Location: [facility];  Service: General;  Laterality: N/A;   GASTRIC ROUX-EN-Y N/A 03/25/2016   Procedure: LAPAROSCOPIC ROUX-EN-Y GASTRIC BYPASS WITH UPPER ENDOSCOPY;  Surgeon: Glenna Fellows, MD;  Location: [facility];  Service: General;  Laterality: N/A;   KNEE SURGERY     LAPAROSCOPIC ASSISTED VAGINAL HYSTERECTOMY  03/04/2011   Procedure: LAPAROSCOPIC ASSISTED VAGINAL HYSTERECTOMY;  Surgeon: Jeani Hawking, MD;  Location: [facility];  Service: Gynecology;  Laterality: N/A;   LAPAROTOMY N/A 04/18/2022   Procedure: EXPLORATORY LAPAROTOMY;  Surgeon: Harriette Bouillon, MD;  Location: [facility];  Service:  General;  Laterality: N/A;   LAPAROTOMY N/A 04/24/2022   Procedure: BRING BACK EXPLORATORY LAPAROTOMY;  Surgeon: Violeta Gelinas, MD;  Location: [facility] OR;  Service: General;  Laterality: N/A;   TOTAL HIP ARTHROPLASTY Right 08/27/2022   Procedure: TOTAL HIP ARTHROPLASTY;  Surgeon: Joen Laura, MD;  Location: [facility] OR;  Service: Orthopedics;  Laterality: Right;     Medications:   Current Facility-Administered Medications:    acetaminophen (TYLENOL) tablet 650 mg, 650 mg, Oral, Q6H PRN, Rhunette Croft, Ankit, MD, 650 mg at 06/15/23 1950   albuterol (VENTOLIN HFA) 108 (90 Base) MCG/ACT inhaler 2 puff, 2 puff, Inhalation, Q4H PRN, Horton, Mayer Masker, MD, 2 puff at 06/13/23 1723   ARIPiprazole (ABILIFY) tablet 10 mg, 10 mg, Oral, Daily, Motley-Mangrum, Jadeka A, PMHNP, 10 mg at 06/16/23 0925   divalproex (DEPAKOTE) DR tablet 500 mg, 500 mg, Oral, q AM, Ophelia Shoulder E, NP, 500 mg at 06/16/23 0925   divalproex (DEPAKOTE) DR tablet 750 mg, 750 mg, Oral, QHS, Ophelia Shoulder E, NP, 750 mg at 06/15/23 2127   folic acid (FOLVITE) tablet 1 mg, 1 mg, Oral, Daily, Durwin Glaze, MD, 1 mg at 06/16/23 0865   ipratropium-albuterol (DUONEB) 0.5-2.5 (3) MG/3ML nebulizer solution 3 mL, 3 mL, Nebulization, Once, Durwin Glaze, MD   levETIRAcetam (KEPPRA XR) 24 hr tablet 500 mg, 500 mg, Oral, BID, Motley-Mangrum, Jadeka A, PMHNP, 500 mg at 06/16/23 0830   [EXPIRED] LORazepam (ATIVAN) tablet 0-4 mg, 0-4 mg, Oral, Q6H, 1 mg at 06/14/23 1318 **FOLLOWED BY** LORazepam (ATIVAN) tablet 0-4 mg, 0-4 mg, Oral, Q12H, Rondel Baton, MD   metFORMIN (GLUCOPHAGE-XR) 24 hr tablet 750 mg, 750 mg, Oral, Daily, Motley-Mangrum, Jadeka A, PMHNP, 750 mg at 06/16/23 7846   naproxen (NAPROSYN) tablet 500 mg, 500 mg, Oral, BID WC, Pricilla Loveless, MD, 500 mg at 06/16/23 0831   nicotine (NICODERM CQ - dosed in mg/24 hours) patch 21 mg, 21 mg, Transdermal, Daily, Pricilla Loveless, MD,  21 mg at 06/15/23 1610   nicotine polacrilex (NICORETTE) gum  2 mg, 2 mg, Oral, PRN, Pricilla Loveless, MD, 2 mg at 06/12/23 2015   OLANZapine zydis (ZYPREXA) disintegrating tablet 5 mg, 5 mg, Oral, Q8H PRN, Pricilla Loveless, MD   thiamine (VITAMIN B1) injection 100 mg, 100 mg, Intramuscular, Once, Motley-Mangrum, Jadeka A, PMHNP   thiamine (VITAMIN B1) tablet 100 mg, 100 mg, Oral, Daily, Motley-Mangrum, Jadeka A, PMHNP, 100 mg at 06/16/23 9604  Current Outpatient Medications:    albuterol (VENTOLIN HFA) 108 (90 Base) MCG/ACT inhaler, Inhale 1-2 puffs into the lungs every 6 (six) hours as needed for wheezing or shortness of breath., Disp: 1 each, Rfl: 0   budesonide-formoterol (SYMBICORT) 160-4.5 MCG/ACT inhaler, Inhale 2 puffs into the lungs 2 (two) times daily., Disp: 10.2 g, Rfl: 2   divalproex (DEPAKOTE) 500 MG DR tablet, Take 1 tablet (500 mg total) by mouth every 12 (twelve) hours., Disp: 60 tablet, Rfl: 3   fluticasone-salmeterol (ADVAIR) 250-50 MCG/ACT AEPB, Inhale 1 puff into the lungs in the morning and at bedtime., Disp: , Rfl:    ipratropium-albuterol (DUONEB) 0.5-2.5 (3) MG/3ML SOLN, Take 3 mLs by nebulization every 6 (six) hours as needed (for shortness of breath or wheezing)., Disp: , Rfl:    levETIRAcetam (KEPPRA XR) 500 MG 24 hr tablet, Take 1 tablet (500 mg total) by mouth 2 (two) times daily., Disp: 60 tablet, Rfl: 3   OXYGEN, Inhale 4-5 L/min into the lungs as needed (for COPD-related symptoms)., Disp: , Rfl:    ARIPiprazole (ABILIFY) 10 MG tablet, Take 1 tablet (10 mg total) by mouth daily. (Patient not taking: Reported on 05/02/2023), Disp: 30 tablet, Rfl: 3   cyanocobalamin (VITAMIN B12) 1000 MCG tablet, Take 1 tablet (1,000 mcg total) by mouth daily. (Patient not taking: Reported on 06/12/2023), Disp: 130 tablet, Rfl: 0   famotidine (PEPCID) 20 MG tablet, Take 1 tablet (20 mg total) by mouth daily. (Patient not taking: Reported on 05/02/2023), Disp: 30 tablet, Rfl: 0   ferrous sulfate 325 (65 FE) MG tablet, Take 1 tablet (325 mg total) by  mouth every other day. (Patient not taking: Reported on 05/02/2023), Disp: 100 tablet, Rfl: 0   furosemide (LASIX) 20 MG tablet, Take 1 tablet (20 mg total) by mouth daily. (Patient not taking: Reported on 05/02/2023), Disp: 2 tablet, Rfl: 0   gabapentin (NEURONTIN) 300 MG capsule, Take 1 capsule (300 mg total) by mouth 3 (three) times daily. (Patient not taking: Reported on 05/02/2023), Disp: 90 capsule, Rfl: 3   hyoscyamine (LEVSIN/SL) 0.125 MG SL tablet, Place 1 tablet (0.125 mg total) under the tongue every 4 (four) hours as needed (pain). Up to 1.25 mg daily (Patient not taking: Reported on 05/02/2023), Disp: 20 tablet, Rfl: 0   [START ON 06/17/2023] metFORMIN (GLUCOPHAGE-XR) 750 MG 24 hr tablet, Take 1 tablet (750 mg total) by mouth daily., Disp: 30 tablet, Rfl: 0   nicotine (NICODERM CQ - DOSED IN MG/24 HOURS) 21 mg/24hr patch, Place 1 patch (21 mg total) onto the skin daily., Disp: 28 patch, Rfl: 0   nicotine polacrilex (NICORETTE) 2 MG gum, Take 1 each (2 mg total) by mouth as needed for smoking cessation., Disp: 100 tablet, Rfl: 0   ondansetron (ZOFRAN) 4 MG tablet, Take 1 tablet (4 mg total) by mouth every 6 (six) hours. (Patient not taking: Reported on 05/02/2023), Disp: 12 tablet, Rfl: 0   ondansetron (ZOFRAN-ODT) 4 MG disintegrating tablet, Take 1 tablet (4 mg total) by mouth every  8 (eight) hours as needed for nausea or vomiting. (Patient not taking: Reported on 05/02/2023), Disp: 8 tablet, Rfl: 0   pantoprazole (PROTONIX) 40 MG tablet, Take 1 tablet (40 mg total) by mouth daily. (Patient not taking: Reported on 06/12/2023), Disp: 30 tablet, Rfl: 0   predniSONE (DELTASONE) 20 MG tablet, Take 2 tablets (40 mg total) by mouth daily with breakfast. (Patient not taking: Reported on 06/12/2023), Disp: 10 tablet, Rfl: 0   sucralfate (CARAFATE) 1 g tablet, Take 1 tablet (1 g total) by mouth 4 (four) times daily -  with meals and at bedtime. (Patient not taking: Reported on 05/02/2023), Disp: 90 tablet,  Rfl: 0   tiZANidine (ZANAFLEX) 2 MG tablet, Take 2 mg by mouth at bedtime. (Patient not taking: Reported on 05/02/2023), Disp: , Rfl:    zonisamide (ZONEGRAN) 100 MG capsule, Take 2 capsules (200 mg total) by mouth daily. (Patient not taking: Reported on 06/12/2023), Disp: 30 capsule, Rfl: 3  Allergies: Allergies  Allergen Reactions   Iron Dextran Shortness Of Breath and Anxiety   Aspirin Nausea And Vomiting and Other (See Comments)    Ok to take tylenol or ibuprofen    Penicillins Other (See Comments)    Unknown reaction from childhood    Earney Navy, NP-PMHNP-BC

## 2023-06-16 NOTE — ED Notes (Signed)
Spoke with Misty Stanley (ED Diplomatic Services operational officer) and she informed me that the IVC has been rescinded.

## 2023-06-16 NOTE — ED Notes (Signed)
Gve patient her belongings and bus pass to go home.

## 2023-06-24 ENCOUNTER — Encounter (HOSPITAL_COMMUNITY): Payer: Self-pay | Admitting: Emergency Medicine

## 2023-06-24 ENCOUNTER — Emergency Department (HOSPITAL_COMMUNITY)
Admission: EM | Admit: 2023-06-24 | Discharge: 2023-06-25 | Disposition: A | Discharge status: Home / Self Care | Payer: 59 | Attending: Emergency Medicine | Admitting: Emergency Medicine

## 2023-06-24 ENCOUNTER — Emergency Department (HOSPITAL_COMMUNITY): Payer: 59

## 2023-06-24 ENCOUNTER — Other Ambulatory Visit: Payer: Self-pay

## 2023-06-24 DIAGNOSIS — J449 Chronic obstructive pulmonary disease, unspecified: Secondary | ICD-10-CM | POA: Insufficient documentation

## 2023-06-24 DIAGNOSIS — Z1152 Encounter for screening for COVID-19: Secondary | ICD-10-CM | POA: Diagnosis not present

## 2023-06-24 DIAGNOSIS — R4182 Altered mental status, unspecified: Secondary | ICD-10-CM

## 2023-06-24 DIAGNOSIS — I480 Paroxysmal atrial fibrillation: Secondary | ICD-10-CM | POA: Insufficient documentation

## 2023-06-24 DIAGNOSIS — F1990 Other psychoactive substance use, unspecified, uncomplicated: Secondary | ICD-10-CM | POA: Insufficient documentation

## 2023-06-24 DIAGNOSIS — I10 Essential (primary) hypertension: Secondary | ICD-10-CM | POA: Insufficient documentation

## 2023-06-24 DIAGNOSIS — D72829 Elevated white blood cell count, unspecified: Secondary | ICD-10-CM | POA: Insufficient documentation

## 2023-06-24 DIAGNOSIS — F1020 Alcohol dependence, uncomplicated: Secondary | ICD-10-CM | POA: Diagnosis present

## 2023-06-24 DIAGNOSIS — F1910 Other psychoactive substance abuse, uncomplicated: Secondary | ICD-10-CM | POA: Diagnosis present

## 2023-06-24 LAB — ETHANOL: Alcohol, Ethyl (B): <10 mg/dL (ref <10)

## 2023-06-24 LAB — RAPID URINE DRUG SCREEN, HOSP PERFORMED
Amphetamines: NOT DETECTED
Barbiturates: NOT DETECTED
Benzodiazepines: NOT DETECTED
Cocaine: POSITIVE — AB
Opiates: NOT DETECTED
Tetrahydrocannabinol: NOT DETECTED

## 2023-06-24 LAB — CBC WITH DIFFERENTIAL/PLATELET
Abs Immature Granulocytes: 0.03 10*3/uL (ref 0.00–0.07)
Basophils Absolute: 0 10*3/uL (ref 0.0–0.1)
Basophils Relative: 0 %
Eosinophils Absolute: 0 10*3/uL (ref 0.0–0.5)
Eosinophils Relative: 0 %
HCT: 32.6 % — ABNORMAL LOW (ref 36.0–46.0)
Hemoglobin: 9.2 g/dL — ABNORMAL LOW (ref 12.0–15.0)
Immature Granulocytes: 0 %
Lymphocytes Relative: 13 %
Lymphs Abs: 1.4 10*3/uL (ref 0.7–4.0)
MCH: 19.4 pg — ABNORMAL LOW (ref 26.0–34.0)
MCHC: 28.2 g/dL — ABNORMAL LOW (ref 30.0–36.0)
MCV: 68.8 fL — ABNORMAL LOW (ref 80.0–100.0)
Monocytes Absolute: 0.6 10*3/uL (ref 0.1–1.0)
Monocytes Relative: 5 %
Neutro Abs: 8.7 10*3/uL — ABNORMAL HIGH (ref 1.7–7.7)
Neutrophils Relative %: 82 %
Platelets: 386 10*3/uL (ref 150–400)
RBC: 4.74 MIL/uL (ref 3.87–5.11)
RDW: 21.4 % — ABNORMAL HIGH (ref 11.5–15.5)
WBC: 10.8 10*3/uL — ABNORMAL HIGH (ref 4.0–10.5)
nRBC: 0 % (ref 0.0–0.2)

## 2023-06-24 LAB — COMPREHENSIVE METABOLIC PANEL
ALT: 16 U/L (ref 0–44)
AST: 24 U/L (ref 15–41)
Albumin: 3.6 g/dL (ref 3.5–5.0)
Alkaline Phosphatase: 101 U/L (ref 38–126)
Anion gap: 13 (ref 5–15)
BUN: <5 mg/dL — ABNORMAL LOW (ref 6–20)
CO2: 20 mmol/L — ABNORMAL LOW (ref 22–32)
Calcium: 8.9 mg/dL (ref 8.9–10.3)
Chloride: 97 mmol/L — ABNORMAL LOW (ref 98–111)
Creatinine, Ser: 0.53 mg/dL (ref 0.44–1.00)
GFR, Estimated: >60 mL/min (ref >60)
Glucose, Bld: 132 mg/dL — ABNORMAL HIGH (ref 70–99)
Potassium: 4.1 mmol/L (ref 3.5–5.1)
Sodium: 130 mmol/L — ABNORMAL LOW (ref 135–145)
Total Bilirubin: 1 mg/dL (ref 0.0–1.2)
Total Protein: 7.3 g/dL (ref 6.5–8.1)

## 2023-06-24 LAB — RESP PANEL BY RT-PCR (RSV, FLU A&B, COVID)  RVPGX2
Influenza A by PCR: NEGATIVE
Influenza B by PCR: NEGATIVE
Resp Syncytial Virus by PCR: NEGATIVE
SARS Coronavirus 2 by RT PCR: NEGATIVE

## 2023-06-24 LAB — URINALYSIS, ROUTINE W REFLEX MICROSCOPIC
Bilirubin Urine: NEGATIVE
Glucose, UA: NEGATIVE mg/dL
Hgb urine dipstick: NEGATIVE
Ketones, ur: 5 mg/dL — AB
Leukocytes,Ua: NEGATIVE
Nitrite: NEGATIVE
Protein, ur: NEGATIVE mg/dL
Specific Gravity, Urine: 1.009 (ref 1.005–1.030)
pH: 5 (ref 5.0–8.0)

## 2023-06-24 LAB — CK: Total CK: 131 U/L (ref 38–234)

## 2023-06-24 LAB — LACTIC ACID, PLASMA: Lactic Acid, Venous: 1.2 mmol/L (ref 0.5–1.9)

## 2023-06-24 LAB — ACETAMINOPHEN LEVEL: Acetaminophen (Tylenol), Serum: <10 ug/mL — ABNORMAL LOW (ref 10–30)

## 2023-06-24 LAB — SALICYLATE LEVEL: Salicylate Lvl: <7 mg/dL — ABNORMAL LOW (ref 7.0–30.0)

## 2023-06-24 LAB — AMMONIA: Ammonia: 32 umol/L (ref 9–35)

## 2023-06-24 MED ORDER — LACTATED RINGERS IV BOLUS
1000.0000 mL | Freq: Once | INTRAVENOUS | Status: AC
Start: 2023-06-24 — End: 2023-06-24
  Administered 2023-06-24: 1000 mL via INTRAVENOUS

## 2023-06-24 MED ORDER — FOLIC ACID 1 MG PO TABS
1.0000 mg | ORAL_TABLET | Freq: Every day | ORAL | Status: DC
Start: 2023-06-25 — End: 2023-06-25

## 2023-06-24 MED ORDER — THIAMINE MONONITRATE 100 MG PO TABS: 100.0000 mg | ORAL_TABLET | Freq: Every day | ORAL | Status: DC

## 2023-06-24 MED ORDER — FOLIC ACID 1 MG PO TABS
1.0000 mg | ORAL_TABLET | Freq: Every day | ORAL | Status: DC
Start: 2023-06-24 — End: 2023-06-24

## 2023-06-24 MED ORDER — THIAMINE HCL 100 MG/ML IJ SOLN
100.0000 mg | Freq: Every day | INTRAMUSCULAR | Status: DC
  Administered 2023-06-24: 100 mg via INTRAVENOUS
  Filled 2023-06-24: qty 2

## 2023-06-24 MED ORDER — LORAZEPAM 1 MG PO TABS: 1.0000 mg | ORAL_TABLET | ORAL | Status: DC | PRN

## 2023-06-24 MED ORDER — LORAZEPAM 2 MG/ML IJ SOLN: 1.0000 mg | INTRAMUSCULAR | Status: DC | PRN

## 2023-06-24 MED ORDER — LORAZEPAM 2 MG/ML IJ SOLN
2.0000 mg | Freq: Once | INTRAMUSCULAR | Status: AC
  Administered 2023-06-24: 2 mg via INTRAVENOUS
  Filled 2023-06-24: qty 1

## 2023-06-24 NOTE — ED Notes (Signed)
 CCMD contacted for cardiac monitoring

## 2023-06-24 NOTE — ED Triage Notes (Signed)
 PT BIB EMS from daughters house. Pt states she has headache, backache and butt ache after sleeping on daughters couch. Daughter called EMS and stated patient had several seizures today to which the pt denies.

## 2023-06-24 NOTE — ED Notes (Signed)
 1st lactic in normal, 2nd can be discontinued if Dr allows

## 2023-06-24 NOTE — ED Provider Notes (Signed)
 [place] [facility] AT [facility] Provider Note   CSN: [id number] Arrival date & time: 06/24/23  1642     History Chief Complaint  Patient presents with   Headache   Altered Mental Status    [name] is a 58 y.o. female well-known to the emergency department with history of alcohol  use disorder, COPD, paroxysmal A-fib, hypertension, diabetes, schizoaffective disorder, polysubstance use presents to the emergency room today via EMS for evaluation of possible seizure-like activity.  Patient is altered and cannot provide a good history.  Is having nonsensical speech.  I called and spoke to the patient's daughter on file.  She reports that she has been more altered over the past 2 to 3 days.  She has been having worsening progressively behaviors and being combative.  Endorses crack cocaine use.  Unsure when her last alcohol  use was.  Reports that the patient called earlier asking for money for beer.  The daughter reports that she will have these moments where she stares out and dissipates and she makes this clicking sound with her mouth.  This happens whenever she uses larges amount of crack or whenever she goes without crack for a while.  She reports that she is also having multiple falls as well.  She does not have any body rigidity or jerking.  Level 5 caveat due to patient's altered mental status.   Headache Altered Mental Status      Home Medications Prior to Admission medications   Medication Sig Start Date End Date Taking? Authorizing Provider  albuterol  (VENTOLIN  HFA) 108 (90 Base) MCG/ACT inhaler Inhale 1-2 puffs into the lungs every 6 (six) hours as needed for wheezing or shortness of breath. 04/01/23   Francesca Elsie LITTIE, MD  ARIPiprazole  (ABILIFY ) 10 MG tablet Take 1 tablet (10 mg total) by mouth daily. Patient not taking: Reported on 05/02/2023 05/02/23   Cam Charlie Loving, DO  budesonide -formoterol  (SYMBICORT ) 160-4.5 MCG/ACT inhaler Inhale 2  puffs into the lungs 2 (two) times daily. 02/25/23   Theadore Ozell HERO, MD  cyanocobalamin  (VITAMIN B12) 1000 MCG tablet Take 1 tablet (1,000 mcg total) by mouth daily. Patient not taking: Reported on 06/12/2023 03/16/23 07/24/23  Perri DELENA Meliton Mickey., MD  divalproex  (DEPAKOTE ) 500 MG DR tablet Take 1 tablet (500 mg total) by mouth every 12 (twelve) hours. 05/01/23   Cam Charlie Loving, DO  famotidine  (PEPCID ) 20 MG tablet Take 1 tablet (20 mg total) by mouth daily. Patient not taking: Reported on 05/02/2023 02/25/23   Theadore Ozell HERO, MD  ferrous sulfate  325 (65 FE) MG tablet Take 1 tablet (325 mg total) by mouth every other day. Patient not taking: Reported on 05/02/2023 03/18/23 10/02/23  Perri DELENA Meliton Mickey., MD  fluticasone -salmeterol (ADVAIR) 250-50 MCG/ACT AEPB Inhale 1 puff into the lungs in the morning and at bedtime.    [provider]  furosemide  (LASIX ) 20 MG tablet Take 1 tablet (20 mg total) by mouth daily. Patient not taking: Reported on 05/02/2023 02/24/23   Bernis Ernst, PA-C  gabapentin  (NEURONTIN ) 300 MG capsule Take 1 capsule (300 mg total) by mouth 3 (three) times daily. Patient not taking: Reported on 05/02/2023 05/01/23   Cam Charlie Loving, DO  hyoscyamine  (LEVSIN AMIEL) 0.125 MG SL tablet Place 1 tablet (0.125 mg total) under the tongue every 4 (four) hours as needed (pain). Up to 1.25 mg daily Patient not taking: Reported on 05/02/2023 01/23/23   Harris, Abigail, PA-C  ipratropium-albuterol  (DUONEB) 0.5-2.5 (3) MG/3ML SOLN Take  3 mLs by nebulization every 6 (six) hours as needed (for shortness of breath or wheezing).    [provider]  levETIRAcetam  (KEPPRA  XR) 500 MG 24 hr tablet Take 1 tablet (500 mg total) by mouth 2 (two) times daily. 05/01/23   Cam Charlie Loving, DO  metFORMIN  (GLUCOPHAGE -XR) 750 MG 24 hr tablet Take 1 tablet (750 mg total) by mouth daily. 06/17/23 07/17/23  Onuoha, Josephine C, NP  nicotine  (NICODERM CQ  - DOSED IN MG/24 HOURS) 21  mg/24hr patch Place 1 patch (21 mg total) onto the skin daily. 06/16/23   Onuoha, Josephine C, NP  nicotine  polacrilex (NICORETTE ) 2 MG gum Take 1 each (2 mg total) by mouth as needed for smoking cessation. 06/16/23   Onuoha, Josephine C, NP  ondansetron  (ZOFRAN ) 4 MG tablet Take 1 tablet (4 mg total) by mouth every 6 (six) hours. Patient not taking: Reported on 05/02/2023 01/20/23   Prosperi, Christian H, PA-C  ondansetron  (ZOFRAN -ODT) 4 MG disintegrating tablet Take 1 tablet (4 mg total) by mouth every 8 (eight) hours as needed for nausea or vomiting. Patient not taking: Reported on 05/02/2023 03/01/23   Davis, Jonathon H, MD  OXYGEN  Inhale 4-5 L/min into the lungs as needed (for COPD-related symptoms).    [provider]  pantoprazole  (PROTONIX ) 40 MG tablet Take 1 tablet (40 mg total) by mouth daily. Patient not taking: Reported on 06/12/2023 03/17/23 06/12/23  Perri DELENA Meliton Mickey., MD  predniSONE  (DELTASONE ) 20 MG tablet Take 2 tablets (40 mg total) by mouth daily with breakfast. Patient not taking: Reported on 06/12/2023 04/16/23   Haze Lonni PARAS, MD  sucralfate  (CARAFATE ) 1 g tablet Take 1 tablet (1 g total) by mouth 4 (four) times daily -  with meals and at bedtime. Patient not taking: Reported on 05/02/2023 02/11/23   Horton, Charmaine FALCON, MD  tiZANidine  (ZANAFLEX ) 2 MG tablet Take 2 mg by mouth at bedtime. Patient not taking: Reported on 05/02/2023 02/19/23   [provider]  zonisamide  (ZONEGRAN ) 100 MG capsule Take 2 capsules (200 mg total) by mouth daily. Patient not taking: Reported on 06/12/2023 01/12/23   Patsy Lenis, MD      Allergies    Iron  dextran, Aspirin , and Penicillins    Review of Systems   Review of Systems  Unable to perform ROS: Mental status change    Physical Exam Updated Vital Signs BP 123/70   Pulse (!) 102   Temp 99 F (37.2 C) (Axillary)   Resp (!) 24   Ht 5' 2 (1.575 m)   Wt 52 kg   LMP 01/08/2011   SpO2 95%   BMI 20.97 kg/m   Physical Exam Vitals and nursing note reviewed.  Constitutional:      Comments: Chronically ill-appearing, disheveled  HENT:     Head: Normocephalic and atraumatic.     Mouth/Throat:     Mouth: Mucous membranes are moist.  Eyes:     Extraocular Movements: Extraocular movements intact.     Pupils: Pupils are equal, round, and reactive to light.  Cardiovascular:     Rate and Rhythm: Tachycardia present.  Pulmonary:     Effort: Pulmonary effort is normal. No respiratory distress.  Abdominal:     Palpations: Abdomen is soft.  Musculoskeletal:     Cervical back: Normal range of motion.  Skin:    General: Skin is warm and dry.  Neurological:     Mental Status: She is alert.     Cranial Nerves: No facial asymmetry.  Comments: Patient is alert and awake however is difficult to have her follow commands.  She is having nonsensical speech.  She is moving all extremities and was able to ambulate and put on her shoes independently.  Coordination intact.  Psychiatric:     Comments: Patient having difficulty paying attention, her eyes are darting around the room while she is mumbling to herself, question if she is responding to any internal stimuli.     ED Results / Procedures / Treatments   Labs (all labs ordered are listed, but only abnormal results are displayed) Labs Reviewed  CBC WITH DIFFERENTIAL/PLATELET - Abnormal; Notable for the following components:      Result Value   WBC 10.8 (*)    Hemoglobin 9.2 (*)    HCT 32.6 (*)    MCV 68.8 (*)    MCH 19.4 (*)    MCHC 28.2 (*)    RDW 21.4 (*)    Neutro Abs 8.7 (*)    All other components within normal limits  COMPREHENSIVE METABOLIC PANEL - Abnormal; Notable for the following components:   Sodium 130 (*)    Chloride 97 (*)    CO2 20 (*)    Glucose, Bld 132 (*)    BUN <5 (*)    All other components within normal limits  RAPID URINE DRUG SCREEN, HOSP PERFORMED - Abnormal; Notable for the following components:   Cocaine  POSITIVE (*)    All other components within normal limits  URINALYSIS, ROUTINE W REFLEX MICROSCOPIC - Abnormal; Notable for the following components:   Ketones, ur 5 (*)    All other components within normal limits  ACETAMINOPHEN  LEVEL - Abnormal; Notable for the following components:   Acetaminophen  (Tylenol ), Serum <10 (*)    All other components within normal limits  SALICYLATE LEVEL - Abnormal; Notable for the following components:   Salicylate Lvl <7.0 (*)    All other components within normal limits  RESP PANEL BY RT-PCR (RSV, FLU A&B, COVID)  RVPGX2  CULTURE, BLOOD (ROUTINE X 2)  CULTURE, BLOOD (ROUTINE X 2)  ETHANOL  AMMONIA  CK  LACTIC ACID, PLASMA    EKG None  Radiology CT Head Wo Contrast Result Date: 06/24/2023 CLINICAL DATA:  Mental status change, unknown cause; Facial trauma, blunt; Neck trauma, dangerous injury mechanism (Age 28-64y). Headache and altered mental status. EXAM: CT HEAD WITHOUT CONTRAST CT MAXILLOFACIAL WITHOUT CONTRAST CT CERVICAL SPINE WITHOUT CONTRAST TECHNIQUE: Multidetector CT imaging of the head, cervical spine, and maxillofacial structures were performed using the standard protocol without intravenous contrast. Multiplanar CT image reconstructions of the cervical spine and maxillofacial structures were also generated. RADIATION DOSE REDUCTION: This exam was performed according to the departmental dose-optimization program which includes automated exposure control, adjustment of the mA and/or kV according to patient size and/or use of iterative reconstruction technique. COMPARISON:  CT head, face and cervical spine 05/05/2023. FINDINGS: CT HEAD FINDINGS Brain: No acute intracranial hemorrhage. Gray-white differentiation is preserved. No hydrocephalus or extra-axial collection. No mass effect or midline shift. Vascular: No hyperdense vessel or unexpected calcification. Skull: No calvarial fracture or suspicious bone lesion. Skull base is unremarkable. Other:  None. CT MAXILLOFACIAL FINDINGS Osseous: No fracture or mandibular dislocation. No destructive process. Extensive dental caries. Orbits: Negative. No traumatic or inflammatory finding. Sinuses: Well aerated. Soft tissues: Unremarkable. CT CERVICAL SPINE FINDINGS Alignment: Normal. Skull base and vertebrae: No acute fracture. Normal craniocervical junction. No suspicious bone lesions. Soft tissues and spinal canal: No prevertebral fluid or swelling. No  visible canal hematoma. Disc levels: Unchanged multilevel cervical spondylosis, worst at C5-6, where ossification of the posterior longitudinal ligament and disc bulge contribute to at least mild spinal canal stenosis. Upper chest: No acute findings. Other: Atherosclerotic calcifications of the carotid bulbs. IMPRESSION: 1. No acute intracranial abnormality. 2. No acute facial bone fracture. 3. No acute fracture or traumatic listhesis of the cervical spine. 4. Unchanged multilevel cervical spondylosis, worst at C5-6, where ossification of the posterior longitudinal ligament and disc bulge contribute to at least mild spinal canal stenosis. Electronically Signed   By: Ryan Chess M.D.   On: 06/24/2023 19:22   CT Maxillofacial Wo Contrast Result Date: 06/24/2023 CLINICAL DATA:  Mental status change, unknown cause; Facial trauma, blunt; Neck trauma, dangerous injury mechanism (Age 85-64y). Headache and altered mental status. EXAM: CT HEAD WITHOUT CONTRAST CT MAXILLOFACIAL WITHOUT CONTRAST CT CERVICAL SPINE WITHOUT CONTRAST TECHNIQUE: Multidetector CT imaging of the head, cervical spine, and maxillofacial structures were performed using the standard protocol without intravenous contrast. Multiplanar CT image reconstructions of the cervical spine and maxillofacial structures were also generated. RADIATION DOSE REDUCTION: This exam was performed according to the departmental dose-optimization program which includes automated exposure control, adjustment of the mA and/or  kV according to patient size and/or use of iterative reconstruction technique. COMPARISON:  CT head, face and cervical spine 05/05/2023. FINDINGS: CT HEAD FINDINGS Brain: No acute intracranial hemorrhage. Gray-white differentiation is preserved. No hydrocephalus or extra-axial collection. No mass effect or midline shift. Vascular: No hyperdense vessel or unexpected calcification. Skull: No calvarial fracture or suspicious bone lesion. Skull base is unremarkable. Other: None. CT MAXILLOFACIAL FINDINGS Osseous: No fracture or mandibular dislocation. No destructive process. Extensive dental caries. Orbits: Negative. No traumatic or inflammatory finding. Sinuses: Well aerated. Soft tissues: Unremarkable. CT CERVICAL SPINE FINDINGS Alignment: Normal. Skull base and vertebrae: No acute fracture. Normal craniocervical junction. No suspicious bone lesions. Soft tissues and spinal canal: No prevertebral fluid or swelling. No visible canal hematoma. Disc levels: Unchanged multilevel cervical spondylosis, worst at C5-6, where ossification of the posterior longitudinal ligament and disc bulge contribute to at least mild spinal canal stenosis. Upper chest: No acute findings. Other: Atherosclerotic calcifications of the carotid bulbs. IMPRESSION: 1. No acute intracranial abnormality. 2. No acute facial bone fracture. 3. No acute fracture or traumatic listhesis of the cervical spine. 4. Unchanged multilevel cervical spondylosis, worst at C5-6, where ossification of the posterior longitudinal ligament and disc bulge contribute to at least mild spinal canal stenosis. Electronically Signed   By: Ryan Chess M.D.   On: 06/24/2023 19:22   CT Cervical Spine Wo Contrast Result Date: 06/24/2023 CLINICAL DATA:  Mental status change, unknown cause; Facial trauma, blunt; Neck trauma, dangerous injury mechanism (Age 76-64y). Headache and altered mental status. EXAM: CT HEAD WITHOUT CONTRAST CT MAXILLOFACIAL WITHOUT CONTRAST CT CERVICAL  SPINE WITHOUT CONTRAST TECHNIQUE: Multidetector CT imaging of the head, cervical spine, and maxillofacial structures were performed using the standard protocol without intravenous contrast. Multiplanar CT image reconstructions of the cervical spine and maxillofacial structures were also generated. RADIATION DOSE REDUCTION: This exam was performed according to the departmental dose-optimization program which includes automated exposure control, adjustment of the mA and/or kV according to patient size and/or use of iterative reconstruction technique. COMPARISON:  CT head, face and cervical spine 05/05/2023. FINDINGS: CT HEAD FINDINGS Brain: No acute intracranial hemorrhage. Gray-white differentiation is preserved. No hydrocephalus or extra-axial collection. No mass effect or midline shift. Vascular: No hyperdense vessel or unexpected calcification. Skull: No calvarial fracture  or suspicious bone lesion. Skull base is unremarkable. Other: None. CT MAXILLOFACIAL FINDINGS Osseous: No fracture or mandibular dislocation. No destructive process. Extensive dental caries. Orbits: Negative. No traumatic or inflammatory finding. Sinuses: Well aerated. Soft tissues: Unremarkable. CT CERVICAL SPINE FINDINGS Alignment: Normal. Skull base and vertebrae: No acute fracture. Normal craniocervical junction. No suspicious bone lesions. Soft tissues and spinal canal: No prevertebral fluid or swelling. No visible canal hematoma. Disc levels: Unchanged multilevel cervical spondylosis, worst at C5-6, where ossification of the posterior longitudinal ligament and disc bulge contribute to at least mild spinal canal stenosis. Upper chest: No acute findings. Other: Atherosclerotic calcifications of the carotid bulbs. IMPRESSION: 1. No acute intracranial abnormality. 2. No acute facial bone fracture. 3. No acute fracture or traumatic listhesis of the cervical spine. 4. Unchanged multilevel cervical spondylosis, worst at C5-6, where ossification  of the posterior longitudinal ligament and disc bulge contribute to at least mild spinal canal stenosis. Electronically Signed   By: Ryan Chess M.D.   On: 06/24/2023 19:22    Procedures Procedures   Medications Ordered in ED Medications  LORazepam  (ATIVAN ) tablet 1-4 mg (has no administration in time range)    Or  LORazepam  (ATIVAN ) injection 1-4 mg (has no administration in time range)  thiamine  (VITAMIN B1) tablet 100 mg ( Oral See Alternative 06/24/23 1856)    Or  thiamine  (VITAMIN B1) injection 100 mg (100 mg Intravenous Given 06/24/23 1856)  folic acid  (FOLVITE ) tablet 1 mg (has no administration in time range)  LORazepam  (ATIVAN ) injection 2 mg (2 mg Intravenous Given 06/24/23 1833)  lactated ringers  bolus 1,000 mL (1,000 mLs Intravenous New Bag/Given 06/24/23 2015)    ED Course/ Medical Decision Making/ A&P    Medical Decision Making Amount and/or Complexity of Data Reviewed Labs: ordered. Radiology: ordered.  Risk OTC drugs. Prescription drug management.   58 y.o. female presents to the ER for evaluation of AMS/ possible seizure like activity. Differential diagnosis includes but is not limited to substance use, withdrawal, psych, electrolyte abnormalities. Vital signs tachycardia 109, only elevated temperature nine 9.8, otherwise unremarkable. Physical exam as noted above.   This patient is well-known to the emergency department.  I have seen him myself personally multiple times.  I have never seen her present like this which is concerning.  My attending assessed the patient at bedside as well.  I agreed with workup.  Will also scan the patient's head and face given reported fall by daughter.  Recommended giving fluids.  I independently reviewed and interpreted the patient's labs.  UDS is positive for cocaine.  Urinalysis shows 5 ketones otherwise unremarkable.  COVID, flu, RSV negative.  Ammonia and CK within normal limits.  Ethanol, acetaminophen , salicylate levels are  undetectable.  CMP does show mildly decreased sodium at 130, chloride 97, bicarb of 20 with a normal anion gap.  Glucose mildly elevated 132 with a BUN less than 5.  No other electrolyte or LFT abnormality.  White blood cell count shows mild leukocytosis of 10.8 with a left shift.  Does have some anemia with a hemoglobin of 9.2 however appears to be around baseline.  Lactic within normal limits.  Blood cultures are pending.  CT imaging shows  1. No acute intracranial abnormality. 2. No acute facial bone fracture. 3. No acute fracture or traumatic listhesis of the cervical spine. 4. Unchanged multilevel cervical spondylosis, worst at C5-6, where ossification of the posterior longitudinal ligament and disc bulge contribute to at least mild spinal canal stenosis.  Has now improved to 25F.  Question if she has an elevated temperature earlier from cocaine use.  I have ordered the patient CIWA protocols.  She is usually a daily alcohol  consumer, ethanol undetectable.  Questioning if she is going through some withdrawal symptoms causing some loose donations.  The seizures that were described by daughter do not sound like grand mal tonic-clonic that would be associated with DTs.  Discussed with my attending as well.  Patient was given thiamine  as well with the CIWA protocol.  Concern for possible Warnicke's.  Per nursing staff she is slowly becoming more to baseline.  Speech is becoming more directed instead of nonsensical.  Concern for her earlier presentation that she would have some staring out moments where he did not know she was responding to internal stimuli.  These movements have been less than 5 seconds.  My attending feels the patient is medically clear given her grossly unremarkable workup.  Thinks is likely drug-related.  Reports she is medically clear for psych evaluation.  Portions of this report may have been transcribed using voice recognition software. Every effort was made to ensure accuracy;  however, inadvertent computerized transcription errors may be present.   I discussed this case with my attending physician who cosigned this note including patient's presenting symptoms, physical exam, and planned diagnostics and interventions. Attending physician stated agreement with plan or made changes to plan which were implemented.   Attending physician assessed patient at bedside.  Final Clinical Impression(s) / ED Diagnoses Final diagnoses:  Altered mental status, unspecified altered mental status type  Substance use    Rx / DC Orders ED Discharge Orders     None         Bernis Ernst, PA-C 06/25/23 0133    Laurice Maude BROCKS, MD 06/25/23 [phone]

## 2023-06-25 NOTE — Discharge Instructions (Signed)
Please follow-up with your primary care doctor. Return here for new concerns. 

## 2023-06-25 NOTE — ED Notes (Signed)
 Provided pt with a drink, RN said it was fine.

## 2023-06-25 NOTE — Progress Notes (Signed)
 [facility] Consult Note  Patient Name: [name] MRN: [id number] DOB: December 28, 1965 DATE OF Consult: 06/25/2023  PRIMARY PSYCHIATRIC DIAGNOSES  1.  Polysubstance use disorder  2.  Altered mental status    RECOMMENDATIONS  Recommendations: Medication recommendations:   No medication changes indicated today Non-Medication/therapeutic recommendations:  I discussed rehab with the patient or attending AA meetings.  She is contemplating it at this time. Is inpatient psychiatric hospitalization recommended for this patient? No (Explain why):   No indication Is another care setting recommended for this patient? (examples may include Crisis Stabilization Unit, Residential/Recovery Treatment, ALF/SNF, Memory Care Unit)  Yes (Explain why):  patient would benefit from inpatient rehab but she is not interested at this time Follow-Up Telepsychiatry C/L services: We will sign off for now. Please re-consult our service if needed for any concerning changes in the patient's condition, discharge planning, or questions.  Thank you for involving us  in the care of this patient. If you have any additional questions or concerns, please call [phone] and ask for me or the provider on-call.  TELEPSYCHIATRY ATTESTATION & CONSENT  As the provider for this telehealth consult, I attest that I verified the patient's identity using two separate identifiers, introduced myself to the patient, provided my credentials, disclosed my location, and performed this encounter via a HIPAA-compliant, real-time, face-to-face, two-way, interactive audio and video platform and with the full consent and agreement of the patient (or guardian as applicable.)  Patient physical location: [facility] at [facility] . Telehealth provider physical location: home office in state of [place] .  Video start time: 2 am  (Central Time) Video end time: 2:30 am  (Central Time)  IDENTIFYING DATA  [name] is  a 58 y.o. year-old female for whom a psychiatric consultation has been ordered by the primary provider. The patient was identified using two separate identifiers.  CHIEF COMPLAINT/REASON FOR CONSULT   altered mental status  HISTORY OF PRESENT ILLNESS (HPI)  The patient  is a 58 year old female, known to this emergency department from multiple prior presentations, substance related.  she presents today from her daughter's house after her daughter reports she has multiple seizures.  On presentation, the patient was quite altered and confused.  This is unlike her prior presentations.   By the time I see her, her mentation has cleared significantly.  She does not remember what happened, does not remember coming to the hospital, the last thing she remembers is being at her daughter's house.  I tell her how she presented and she is puzzled.  She says she has been using last crack, has not used it in the past couple of days.  She also says she is drinking a lot less and only had 1 beer yesterday.  We discussed sobriety, going to inpatient rehab versus attending meetings but she is adamant she wants to do it on her own and does not want her assistance at this time.  She feels safe returning to her daughter's home in the morning.  PAST PSYCHIATRIC HISTORY   Otherwise as per HPI above.  PAST MEDICAL HISTORY  Past Medical History:  Diagnosis Date   Abdominal pain    Accidental drug overdose April 2013   Anxiety    Atrial fibrillation (HCC) 09/29/11   converted spontaneously   Chronic back pain    Chronic knee pain    Chronic nausea    Chronic pain    COPD (chronic obstructive pulmonary disease) (HCC)    Depression  Diabetes mellitus    states her doctor took her off all DM meds in past month   Diabetic neuropathy (HCC)    Dyspnea    with exertion    GERD (gastroesophageal reflux disease)    Headache(784.0)    migraines    HTN (hypertension)    not on meds since in a year    Hyperlipidemia     Hypothyroidism    not on meds in a while    Mental disorder    Bipolar and schizophrenic   Nausea and vomiting 01/02/2023   Requires supplemental oxygen     as needed per patient    Schizophrenia (HCC)    Schizophrenia, acute (HCC) 11/13/2017   Tobacco abuse      HOME MEDICATIONS  Facility Ordered Medications  Medication   LORazepam  (ATIVAN ) tablet 1-4 mg   Or   LORazepam  (ATIVAN ) injection 1-4 mg   thiamine  (VITAMIN B1) tablet 100 mg   Or   thiamine  (VITAMIN B1) injection 100 mg   [COMPLETED] LORazepam  (ATIVAN ) injection 2 mg   folic acid  (FOLVITE ) tablet 1 mg   [COMPLETED] lactated ringers  bolus 1,000 mL   PTA Medications  Medication Sig   OXYGEN  Inhale 4-5 L/min into the lungs as needed (for COPD-related symptoms).   zonisamide  (ZONEGRAN ) 100 MG capsule Take 2 capsules (200 mg total) by mouth daily. (Patient not taking: Reported on 06/12/2023)   ondansetron  (ZOFRAN ) 4 MG tablet Take 1 tablet (4 mg total) by mouth every 6 (six) hours. (Patient not taking: Reported on 05/02/2023)   hyoscyamine  (LEVSIN /SL) 0.125 MG SL tablet Place 1 tablet (0.125 mg total) under the tongue every 4 (four) hours as needed (pain). Up to 1.25 mg daily (Patient not taking: Reported on 05/02/2023)   sucralfate  (CARAFATE ) 1 g tablet Take 1 tablet (1 g total) by mouth 4 (four) times daily -  with meals and at bedtime. (Patient not taking: Reported on 05/02/2023)   furosemide  (LASIX ) 20 MG tablet Take 1 tablet (20 mg total) by mouth daily. (Patient not taking: Reported on 05/02/2023)   budesonide -formoterol  (SYMBICORT ) 160-4.5 MCG/ACT inhaler Inhale 2 puffs into the lungs 2 (two) times daily.   famotidine  (PEPCID ) 20 MG tablet Take 1 tablet (20 mg total) by mouth daily. (Patient not taking: Reported on 05/02/2023)   ondansetron  (ZOFRAN -ODT) 4 MG disintegrating tablet Take 1 tablet (4 mg total) by mouth every 8 (eight) hours as needed for nausea or vomiting. (Patient not taking: Reported on 05/02/2023)   tiZANidine   (ZANAFLEX ) 2 MG tablet Take 2 mg by mouth at bedtime. (Patient not taking: Reported on 05/02/2023)   ferrous sulfate  325 (65 FE) MG tablet Take 1 tablet (325 mg total) by mouth every other day. (Patient not taking: Reported on 05/02/2023)   cyanocobalamin  (VITAMIN B12) 1000 MCG tablet Take 1 tablet (1,000 mcg total) by mouth daily. (Patient not taking: Reported on 06/12/2023)   albuterol  (VENTOLIN  HFA) 108 (90 Base) MCG/ACT inhaler Inhale 1-2 puffs into the lungs every 6 (six) hours as needed for wheezing or shortness of breath.   predniSONE  (DELTASONE ) 20 MG tablet Take 2 tablets (40 mg total) by mouth daily with breakfast. (Patient not taking: Reported on 06/12/2023)   ARIPiprazole  (ABILIFY ) 10 MG tablet Take 1 tablet (10 mg total) by mouth daily. (Patient not taking: Reported on 05/02/2023)   divalproex  (DEPAKOTE ) 500 MG DR tablet Take 1 tablet (500 mg total) by mouth every 12 (twelve) hours.   gabapentin  (NEURONTIN ) 300 MG capsule Take 1 capsule (300  mg total) by mouth 3 (three) times daily. (Patient not taking: Reported on 05/02/2023)   levETIRAcetam  (KEPPRA  XR) 500 MG 24 hr tablet Take 1 tablet (500 mg total) by mouth 2 (two) times daily.   ipratropium-albuterol  (DUONEB) 0.5-2.5 (3) MG/3ML SOLN Take 3 mLs by nebulization every 6 (six) hours as needed (for shortness of breath or wheezing).   fluticasone -salmeterol (ADVAIR) 250-50 MCG/ACT AEPB Inhale 1 puff into the lungs in the morning and at bedtime.   nicotine  (NICODERM CQ  - DOSED IN MG/24 HOURS) 21 mg/24hr patch Place 1 patch (21 mg total) onto the skin daily.   nicotine  polacrilex (NICORETTE ) 2 MG gum Take 1 each (2 mg total) by mouth as needed for smoking cessation.   metFORMIN  (GLUCOPHAGE -XR) 750 MG 24 hr tablet Take 1 tablet (750 mg total) by mouth daily.     ALLERGIES  Allergies  Allergen Reactions   Iron  Dextran Shortness Of Breath and Anxiety   Aspirin  Nausea And Vomiting and Other (See Comments)    Ok to take tylenol  or ibuprofen      Penicillins Other (See Comments)    Unknown reaction from childhood    SOCIAL & SUBSTANCE USE HISTORY  Social History   Socioeconomic History   Marital status: Legally Separated    Spouse name: Not on file   Number of children: Not on file   Years of education: Not on file   Highest education level: Not on file  Occupational History   Not on file  Tobacco Use   Smoking status: Every Day    Current packs/day: 1.00    Types: Cigarettes   Smokeless tobacco: Never  Vaping Use   Vaping status: Never Used  Substance and Sexual Activity   Alcohol  use: Yes    Comment: 40 ounces daily   Drug use: Yes    Types: Cocaine, Marijuana, Crack cocaine    Comment: last use- 03/19/2023   Sexual activity: Not Currently    Partners: Male  Other Topics Concern   Not on file  Social History Narrative   Not on file   Social Drivers of Health   Financial Resource Strain: Not on file  Food Insecurity: Patient Declined (04/29/2023)   Hunger Vital Sign    Worried About Running Out of Food in the Last Year: Patient declined    Ran Out of Food in the Last Year: Patient declined  Transportation Needs: Patient Declined (04/29/2023)   PRAPARE - Administrator, Civil Service (Medical): Patient declined    Lack of Transportation (Non-Medical): Patient declined  Physical Activity: Not on file  Stress: Stress Concern Present (04/08/2023)   [employer] of Occupational Health - Occupational Stress Questionnaire    Feeling of Stress : Rather much  Social Connections: Unknown (01/06/2023)   Received from [employer]   Social Network    Social Network: Not on file   Social History   Tobacco Use  Smoking Status Every Day   Current packs/day: 1.00   Types: Cigarettes  Smokeless Tobacco Never   Social History   Substance and Sexual Activity  Alcohol  Use Yes   Comment: 40 ounces daily   Social History   Substance and Sexual Activity  Drug Use Yes   Types: Cocaine,  Marijuana, Crack cocaine   Comment: last use- 03/19/2023      FAMILY HISTORY  Family History  Problem Relation Age of Onset   Heart attack Father        66s   Diabetes Mother  Heart disease Mother    Hypertension Mother    Heart attack Sister        43   COPD Other    Breast cancer Neg Hx      MENTAL STATUS EXAM (MSE)  Mental Status Exam: General Appearance: Casual  Orientation:  Full (Time, Place, and Person)  Memory:  Immediate;   Poor  Concentration:  Concentration: Fair and Attention Span: Fair  Recall:  Fair  Attention  Fair  Eye Contact:  Good  Speech:  Normal Rate  Language:  Good  Volume:  Normal  Mood: neutral  Affect:  Congruent  Thought Process:  Coherent  Thought Content:  Logical  Suicidal Thoughts:  No  Homicidal Thoughts:  No  Judgement:  Impaired  Insight:  Lacking  Psychomotor Activity:  Normal  Akathisia:  No  Fund of Knowledge:  Fair    Assets:  Social Support  Cognition:  WNL  ADL's:  Intact  AIMS (if indicated):       VITALS  Blood pressure 118/73, pulse 96, temperature 99 F (37.2 C), temperature source Axillary, resp. rate (!) 22, height 5' 2 (1.575 m), weight 52 kg, last menstrual period 01/08/2011, SpO2 100%.  LABS  Admission on 06/24/2023  Component Date Value Ref Range Status   Alcohol , Ethyl (B) 06/24/2023 <10  <10 mg/dL Final   Comment: (NOTE) Lowest detectable limit for serum alcohol  is 10 mg/dL.  For medical purposes only. Performed at [facility] Lab, [address], [place], [place] [address]    WBC 06/24/2023 10.8 (H)  4.0 - 10.5 K/uL Final   RBC 06/24/2023 4.74  3.87 - 5.11 MIL/uL Final   Hemoglobin 06/24/2023 9.2 (L)  12.0 - 15.0 g/dL Final   HCT 98/98/7974 32.6 (L)  36.0 - 46.0 % Final   MCV 06/24/2023 68.8 (L)  80.0 - 100.0 fL Final   MCH 06/24/2023 19.4 (L)  26.0 - 34.0 pg Final   MCHC 06/24/2023 28.2 (L)  30.0 - 36.0 g/dL Final   RDW 98/98/7974 21.4 (H)  11.5 - 15.5 % Final   Platelets 06/24/2023 386   150 - 400 K/uL Final   REPEATED TO VERIFY   nRBC 06/24/2023 0.0  0.0 - 0.2 % Final   Neutrophils Relative % 06/24/2023 82  % Final   Neutro Abs 06/24/2023 8.7 (H)  1.7 - 7.7 K/uL Final   Lymphocytes Relative 06/24/2023 13  % Final   Lymphs Abs 06/24/2023 1.4  0.7 - 4.0 K/uL Final   Monocytes Relative 06/24/2023 5  % Final   Monocytes Absolute 06/24/2023 0.6  0.1 - 1.0 K/uL Final   Eosinophils Relative 06/24/2023 0  % Final   Eosinophils Absolute 06/24/2023 0.0  0.0 - 0.5 K/uL Final   Basophils Relative 06/24/2023 0  % Final   Basophils Absolute 06/24/2023 0.0  0.0 - 0.1 K/uL Final   Immature Granulocytes 06/24/2023 0  % Final   Abs Immature Granulocytes 06/24/2023 0.03  0.00 - 0.07 K/uL Final   Performed at [facility], [address] [place], [place] [address]   Sodium 06/24/2023 130 (L)  135 - 145 mmol/L Final   Potassium 06/24/2023 4.1  3.5 - 5.1 mmol/L Final   Chloride 06/24/2023 97 (L)  98 - 111 mmol/L Final   CO2 06/24/2023 20 (L)  22 - 32 mmol/L Final   Glucose, Bld 06/24/2023 132 (H)  70 - 99 mg/dL Final   Glucose reference range applies only to samples taken  after fasting for at least 8 hours.   BUN 06/24/2023 <5 (L)  6 - 20 mg/dL Final   Creatinine, Ser 06/24/2023 0.53  0.44 - 1.00 mg/dL Final   Calcium  06/24/2023 8.9  8.9 - 10.3 mg/dL Final   Total Protein 98/98/7974 7.3  6.5 - 8.1 g/dL Final   Albumin  06/24/2023 3.6  3.5 - 5.0 g/dL Final   AST 98/98/7974 24  15 - 41 U/L Final   ALT 06/24/2023 16  0 - 44 U/L Final   Alkaline Phosphatase 06/24/2023 101  38 - 126 U/L Final   Total Bilirubin 06/24/2023 1.0  0.0 - 1.2 mg/dL Final   GFR, Estimated 06/24/2023 >60  >60 mL/min Final   Comment: (NOTE) Calculated using the CKD-EPI Creatinine Equation (2021)    Anion gap 06/24/2023 13  5 - 15 Final   Performed at [facility], [address] [place], [place] [address]   Opiates 06/24/2023 NONE DETECTED  NONE DETECTED Final   Cocaine 06/24/2023 POSITIVE (A)  NONE  DETECTED Final   Benzodiazepines 06/24/2023 NONE DETECTED  NONE DETECTED Final   Amphetamines 06/24/2023 NONE DETECTED  NONE DETECTED Final   Tetrahydrocannabinol 06/24/2023 NONE DETECTED  NONE DETECTED Final   Barbiturates 06/24/2023 NONE DETECTED  NONE DETECTED Final   Comment: (NOTE) DRUG SCREEN FOR MEDICAL PURPOSES ONLY.  IF CONFIRMATION IS NEEDED FOR ANY PURPOSE, NOTIFY LAB WITHIN 5 DAYS.  LOWEST DETECTABLE LIMITS FOR URINE DRUG SCREEN Drug Class                     Cutoff (ng/mL) Amphetamine and metabolites    1000 Barbiturate and metabolites    200 Benzodiazepine                 200 Opiates and metabolites        300 Cocaine and metabolites        300 THC                            50 Performed at [facility], [address], [place], [place] [address]    Color, Urine 06/24/2023 YELLOW  YELLOW Final   APPearance 06/24/2023 CLEAR  CLEAR Final   Specific Gravity, Urine 06/24/2023 1.009  1.005 - 1.030 Final   pH 06/24/2023 5.0  5.0 - 8.0 Final   Glucose, UA 06/24/2023 NEGATIVE  NEGATIVE mg/dL Final   Hgb urine dipstick 06/24/2023 NEGATIVE  NEGATIVE Final   Bilirubin Urine 06/24/2023 NEGATIVE  NEGATIVE Final   Ketones, ur 06/24/2023 5 (A)  NEGATIVE mg/dL Final   Protein, ur 98/98/7974 NEGATIVE  NEGATIVE mg/dL Final   Nitrite 98/98/7974 NEGATIVE  NEGATIVE Final   Leukocytes,Ua 06/24/2023 NEGATIVE  NEGATIVE Final   Performed at [facility], [address] [place], [place] [address]   Ammonia 06/24/2023 32  9 - 35 umol/L Final   Performed at [facility], [address], [place], [place] [address]   Total CK 06/24/2023 131  38 - 234 U/L Final   Performed at [facility], [address], [place], [place] [address]   Acetaminophen  (Tylenol ), Serum 06/24/2023 <10 (L)  10 - 30 ug/mL Final   Comment: (NOTE) Therapeutic concentrations vary significantly. A range of 10-30 ug/mL  may be an effective concentration for many patients. However, some  are best  treated at concentrations outside of this range. Acetaminophen  concentrations >150 ug/mL at 4  hours after ingestion  and >50 ug/mL at 12 hours after ingestion are often associated with  toxic reactions.  Performed at [facility], [address] [place], [place] [address]    Salicylate Lvl 06/24/2023 <7.0 (L)  7.0 - 30.0 mg/dL Final   Performed at [facility], [address] [place], [place] [address]   SARS Coronavirus 2 by RT PCR 06/24/2023 NEGATIVE  NEGATIVE Final   Influenza A by PCR 06/24/2023 NEGATIVE  NEGATIVE Final   Influenza B by PCR 06/24/2023 NEGATIVE  NEGATIVE Final   Comment: (NOTE) The Xpert Xpress SARS-CoV-2/FLU/RSV plus assay is intended as an aid in the diagnosis of influenza from Nasopharyngeal swab specimens and should not be used as a sole basis for treatment. Nasal washings and aspirates are unacceptable for Xpert Xpress SARS-CoV-2/FLU/RSV testing.  Fact Sheet for Patients: bloggercourse.com  Fact Sheet for Healthcare Providers: seriousbroker.it  This test is not yet approved or cleared by the [place]  FDA and has been authorized for detection and/or diagnosis of SARS-CoV-2 by FDA under an Emergency Use Authorization (EUA). This EUA will remain in effect (meaning this test can be used) for the duration of the COVID-19 declaration under Section 564(b)(1) of the Act, 21 U.S.C. section 360bbb-3(b)(1), unless the authorization is terminated or revoked.     Resp Syncytial Virus by PCR 06/24/2023 NEGATIVE  NEGATIVE Final   Comment: (NOTE) Fact Sheet for Patients: bloggercourse.com  Fact Sheet for Healthcare Providers: seriousbroker.it  This test is not yet approved or cleared by the [place]  FDA and has been authorized for detection and/or diagnosis of SARS-CoV-2 by FDA under an Emergency Use Authorization (EUA). This EUA will  remain in effect (meaning this test can be used) for the duration of the COVID-19 declaration under Section 564(b)(1) of the Act, 21 U.S.C. section 360bbb-3(b)(1), unless the authorization is terminated or revoked.  Performed at [facility], [address] [place], [place] [address]    Lactic Acid, Venous 06/24/2023 1.2  0.5 - 1.9 mmol/L Final   Performed at [facility], [address], [place], [place] [address]    PSYCHIATRIC REVIEW OF SYSTEMS (ROS)  ROS: Notable for the following relevant positive findings: ROS  Additional findings:      Musculoskeletal: No abnormal movements observed      Gait & Station: Laying/Sitting      Pain Screening: Present - mild to moderate        RISK FORMULATION/ASSESSMENT  Is the patient experiencing any suicidal or homicidal ideations: No       Protective factors considered for safety management: No SI  Risk factors/concerns considered for safety management:  Substance abuse/dependence  Is there a safety management plan with the patient and treatment team to minimize risk factors and promote protective factors: Yes           Explain: sobriety, AA, rehab  Is crisis care placement or psychiatric hospitalization recommended: No     Based on my current evaluation and risk assessment, patient is determined at this time to be at:  Low risk  *RISK ASSESSMENT Risk assessment is a dynamic process; it is possible that this patient's condition, and risk level, may change. This should be re-evaluated and managed over time as appropriate. Please re-consult psychiatric consult services if additional assistance is needed in terms of risk assessment and management. If your team decides to discharge this patient, please advise the patient how to best access emergency psychiatric services, or  to call 911, if their condition worsens or they feel unsafe in any way.   Berle JAYSON Gibney, MD Telepsychiatry Consult ServicesPatient ID: [name],  female   DOB: Mar 13, 1966, 58 y.o.   MRN: [id number]

## 2023-06-25 NOTE — ED Notes (Signed)
 Spoke with [name] her daughter and guardian at this time and she stated it was ok to send [name] to the lobby.  She will come but will be later.  Ok to d/c now.

## 2023-06-25 NOTE — ED Provider Notes (Signed)
  5:27 AM TTS evaluated, did not recommend any inpatient treatment.  Felt to be likely substance induced.  Daughter is aware of recommendations and plan for discharge.  Requested to have patient wait in the lobby for her and she will be here as soon as she can.   Jarold Olam HERO, PA-C 06/25/23 [id number]    Theadore Ozell HERO, MD 06/25/23 [phone]

## 2023-06-27 ENCOUNTER — Encounter (HOSPITAL_COMMUNITY): Payer: Self-pay

## 2023-06-27 ENCOUNTER — Emergency Department (HOSPITAL_COMMUNITY)
Admission: EM | Admit: 2023-06-27 | Discharge: 2023-06-28 | Disposition: A | Discharge status: Home / Self Care | Payer: 59 | Attending: Emergency Medicine | Admitting: Emergency Medicine

## 2023-06-27 ENCOUNTER — Other Ambulatory Visit: Payer: Self-pay

## 2023-06-27 ENCOUNTER — Emergency Department (HOSPITAL_COMMUNITY): Payer: 59

## 2023-06-27 DIAGNOSIS — Z7951 Long term (current) use of inhaled steroids: Secondary | ICD-10-CM | POA: Diagnosis not present

## 2023-06-27 DIAGNOSIS — I10 Essential (primary) hypertension: Secondary | ICD-10-CM | POA: Insufficient documentation

## 2023-06-27 DIAGNOSIS — E1165 Type 2 diabetes mellitus with hyperglycemia: Secondary | ICD-10-CM | POA: Insufficient documentation

## 2023-06-27 DIAGNOSIS — R1084 Generalized abdominal pain: Secondary | ICD-10-CM | POA: Diagnosis not present

## 2023-06-27 DIAGNOSIS — Z20822 Contact with and (suspected) exposure to covid-19: Secondary | ICD-10-CM | POA: Insufficient documentation

## 2023-06-27 DIAGNOSIS — Z7984 Long term (current) use of oral hypoglycemic drugs: Secondary | ICD-10-CM | POA: Insufficient documentation

## 2023-06-27 DIAGNOSIS — R0602 Shortness of breath: Secondary | ICD-10-CM | POA: Diagnosis present

## 2023-06-27 DIAGNOSIS — J441 Chronic obstructive pulmonary disease with (acute) exacerbation: Secondary | ICD-10-CM | POA: Insufficient documentation

## 2023-06-27 DIAGNOSIS — Z79899 Other long term (current) drug therapy: Secondary | ICD-10-CM | POA: Diagnosis not present

## 2023-06-27 DIAGNOSIS — E876 Hypokalemia: Secondary | ICD-10-CM | POA: Diagnosis not present

## 2023-06-27 LAB — CBC WITH DIFFERENTIAL/PLATELET
Abs Immature Granulocytes: 0 10*3/uL (ref 0.00–0.07)
Basophils Absolute: 0 10*3/uL (ref 0.0–0.1)
Basophils Relative: 0 %
Eosinophils Absolute: 0.1 10*3/uL (ref 0.0–0.5)
Eosinophils Relative: 2 %
HCT: 29.2 % — ABNORMAL LOW (ref 36.0–46.0)
Hemoglobin: 7.9 g/dL — ABNORMAL LOW (ref 12.0–15.0)
Lymphocytes Relative: 31 %
Lymphs Abs: 1.8 10*3/uL (ref 0.7–4.0)
MCH: 19.6 pg — ABNORMAL LOW (ref 26.0–34.0)
MCHC: 27.1 g/dL — ABNORMAL LOW (ref 30.0–36.0)
MCV: 72.3 fL — ABNORMAL LOW (ref 80.0–100.0)
Monocytes Absolute: 0.1 10*3/uL (ref 0.1–1.0)
Monocytes Relative: 2 %
Neutro Abs: 3.8 10*3/uL (ref 1.7–7.7)
Neutrophils Relative %: 65 %
Platelets: 434 10*3/uL — ABNORMAL HIGH (ref 150–400)
RBC: 4.04 MIL/uL (ref 3.87–5.11)
RDW: 21.8 % — ABNORMAL HIGH (ref 11.5–15.5)
WBC: 5.9 10*3/uL (ref 4.0–10.5)
nRBC: 0 % (ref 0.0–0.2)
nRBC: 0 /100{WBCs}

## 2023-06-27 LAB — BASIC METABOLIC PANEL
Anion gap: 14 (ref 5–15)
BUN: <5 mg/dL — ABNORMAL LOW (ref 6–20)
CO2: 20 mmol/L — ABNORMAL LOW (ref 22–32)
Calcium: 8.1 mg/dL — ABNORMAL LOW (ref 8.9–10.3)
Chloride: 108 mmol/L (ref 98–111)
Creatinine, Ser: 0.57 mg/dL (ref 0.44–1.00)
GFR, Estimated: >60 mL/min (ref >60)
Glucose, Bld: 225 mg/dL — ABNORMAL HIGH (ref 70–99)
Potassium: 3.3 mmol/L — ABNORMAL LOW (ref 3.5–5.1)
Sodium: 142 mmol/L (ref 135–145)

## 2023-06-27 LAB — RESP PANEL BY RT-PCR (RSV, FLU A&B, COVID)  RVPGX2
Influenza A by PCR: NEGATIVE
Influenza B by PCR: NEGATIVE
Resp Syncytial Virus by PCR: NEGATIVE
SARS Coronavirus 2 by RT PCR: NEGATIVE

## 2023-06-27 LAB — TROPONIN I (HIGH SENSITIVITY)
Troponin I (High Sensitivity): 3 ng/L (ref <18)
Troponin I (High Sensitivity): 3 ng/L (ref <18)

## 2023-06-27 MED ORDER — IPRATROPIUM-ALBUTEROL 0.5-2.5 (3) MG/3ML IN SOLN
3.0000 mL | Freq: Once | RESPIRATORY_TRACT | Status: AC
  Administered 2023-06-27: 3 mL via RESPIRATORY_TRACT
  Filled 2023-06-27: qty 3

## 2023-06-27 MED ORDER — MAGNESIUM SULFATE 2 GM/50ML IV SOLN
2.0000 g | Freq: Once | INTRAVENOUS | Status: AC
  Administered 2023-06-28: 2 g via INTRAVENOUS
  Filled 2023-06-27: qty 50

## 2023-06-27 MED ORDER — METHYLPREDNISOLONE SODIUM SUCC 125 MG IJ SOLR
125.0000 mg | INTRAMUSCULAR | Status: AC
  Administered 2023-06-28: 125 mg via INTRAVENOUS
  Filled 2023-06-27: qty 2

## 2023-06-27 NOTE — ED Provider Triage Note (Signed)
 Emergency Medicine Provider Triage Evaluation Note  [name] , a 58 y.o. female  was evaluated in triage.  Pt complains of shortness of breath since this morning.  Review of Systems  Positive: Nausea, CP, cough Negative: Fever, chills, V/D, HA  Physical Exam  Ht 5' 2 (1.575 m)   Wt 52.2 kg   LMP 01/08/2011   BMI 21.03 kg/m  Gen:   Awake, no distress   Resp:  Inc. Effort, wheezing diffusely MSK:   Moves extremities without difficulty  Other:    Medical Decision Making  Medically screening exam initiated at 6:49 PM.  Appropriate orders placed.  [name] was informed that the remainder of the evaluation will be completed by another provider, this initial triage assessment does not replace that evaluation, and the importance of remaining in the ED until their evaluation is complete.  Labs and imaging ordered   [name], [place] 06/27/23 [id number]

## 2023-06-27 NOTE — ED Notes (Signed)
 Two IV attempts unsuccessful by this RN.

## 2023-06-27 NOTE — ED Provider Notes (Signed)
 [place] EMERGENCY DEPARTMENT AT [facility] Provider Note   CSN: [id number] Arrival date & time: 06/27/23  1838     History  Chief Complaint  Patient presents with   Shortness of Breath    [name] is a 58 y.o. female with medical history of alcohol  use disorder, COPD, paroxysmal A-fib with RVR, hypertension, type 2 diabetes, schizoaffective disorder, polysubstance abuse, anemia, seizures.  Patient presents to ED for evaluation of shortness of breath and abdominal pain.  Reports that she has been short of breath since this morning.  Also endorsing abdominal pain since this morning.  Denies any nausea, vomiting, diarrhea, dysuria, vaginal discharge, fevers, flank pain.  Denies any chest pain, leg swelling, lightheadedness, dizziness or weakness.  Denies any drug use however patient was noted to be using crack cocaine 2 days ago when she was in the ED for altered mental status, seizures.  Endorsing alcohol  use today, states that she drank before coming in.  EMS reports that the patient was given albuterol .  Denies blood in her stool.   Shortness of Breath Associated symptoms: abdominal pain   Associated symptoms: no chest pain, no fever and no vomiting        Home Medications Prior to Admission medications   Medication Sig Start Date End Date Taking? Authorizing Provider  predniSONE  (DELTASONE ) 20 MG tablet Take 2 tablets (40 mg total) by mouth daily for 4 days. 06/28/23 07/02/23 Yes Ruthell Lonni FALCON, PA-C  albuterol  (VENTOLIN  HFA) 108 (90 Base) MCG/ACT inhaler Inhale 1-2 puffs into the lungs every 6 (six) hours as needed for wheezing or shortness of breath. 04/01/23   Francesca Elsie LITTIE, MD  ARIPiprazole  (ABILIFY ) 10 MG tablet Take 1 tablet (10 mg total) by mouth daily. Patient not taking: Reported on 05/02/2023 05/02/23   Cam Charlie Loving, DO  budesonide -formoterol  (SYMBICORT ) 160-4.5 MCG/ACT inhaler Inhale 2 puffs into the lungs 2 (two) times daily. 02/25/23    Theadore Ozell HERO, MD  cyanocobalamin  (VITAMIN B12) 1000 MCG tablet Take 1 tablet (1,000 mcg total) by mouth daily. Patient not taking: Reported on 06/12/2023 03/16/23 07/24/23  Perri DELENA Meliton Mickey., MD  divalproex  (DEPAKOTE ) 500 MG DR tablet Take 1 tablet (500 mg total) by mouth every 12 (twelve) hours. 05/01/23   Cam Charlie Loving, DO  famotidine  (PEPCID ) 20 MG tablet Take 1 tablet (20 mg total) by mouth daily. Patient not taking: Reported on 05/02/2023 02/25/23   Theadore Ozell HERO, MD  ferrous sulfate  325 (65 FE) MG tablet Take 1 tablet (325 mg total) by mouth every other day. Patient not taking: Reported on 05/02/2023 03/18/23 10/02/23  Perri DELENA Meliton Mickey., MD  fluticasone -salmeterol (ADVAIR) 250-50 MCG/ACT AEPB Inhale 1 puff into the lungs in the morning and at bedtime.    [provider]  furosemide  (LASIX ) 20 MG tablet Take 1 tablet (20 mg total) by mouth daily. Patient not taking: Reported on 05/02/2023 02/24/23   Bernis Ernst, PA-C  gabapentin  (NEURONTIN ) 300 MG capsule Take 1 capsule (300 mg total) by mouth 3 (three) times daily. Patient not taking: Reported on 05/02/2023 05/01/23   Cam Charlie Loving, DO  hyoscyamine  (LEVSIN AMIEL) 0.125 MG SL tablet Place 1 tablet (0.125 mg total) under the tongue every 4 (four) hours as needed (pain). Up to 1.25 mg daily Patient not taking: Reported on 05/02/2023 01/23/23   Harris, Abigail, PA-C  ipratropium-albuterol  (DUONEB) 0.5-2.5 (3) MG/3ML SOLN Take 3 mLs by nebulization every 6 (six) hours as needed (for shortness of breath  or wheezing).    [provider]  levETIRAcetam  (KEPPRA  XR) 500 MG 24 hr tablet Take 1 tablet (500 mg total) by mouth 2 (two) times daily. 05/01/23   Cam Charlie Loving, DO  metFORMIN  (GLUCOPHAGE -XR) 750 MG 24 hr tablet Take 1 tablet (750 mg total) by mouth daily. 06/17/23 07/17/23  Onuoha, Josephine C, NP  nicotine  (NICODERM CQ  - DOSED IN MG/24 HOURS) 21 mg/24hr patch Place 1 patch (21 mg total) onto the skin  daily. 06/16/23   Onuoha, Josephine C, NP  nicotine  polacrilex (NICORETTE ) 2 MG gum Take 1 each (2 mg total) by mouth as needed for smoking cessation. 06/16/23   Onuoha, Josephine C, NP  ondansetron  (ZOFRAN ) 4 MG tablet Take 1 tablet (4 mg total) by mouth every 6 (six) hours. Patient not taking: Reported on 05/02/2023 01/20/23   Prosperi, Christian H, PA-C  ondansetron  (ZOFRAN -ODT) 4 MG disintegrating tablet Take 1 tablet (4 mg total) by mouth every 8 (eight) hours as needed for nausea or vomiting. Patient not taking: Reported on 05/02/2023 03/01/23   Davis, Jonathon H, MD  OXYGEN  Inhale 4-5 L/min into the lungs as needed (for COPD-related symptoms).    [provider]  pantoprazole  (PROTONIX ) 40 MG tablet Take 1 tablet (40 mg total) by mouth daily. Patient not taking: Reported on 06/12/2023 03/17/23 06/12/23  Perri DELENA Meliton Mickey., MD  sucralfate  (CARAFATE ) 1 g tablet Take 1 tablet (1 g total) by mouth 4 (four) times daily -  with meals and at bedtime. Patient not taking: Reported on 05/02/2023 02/11/23   Horton, Charmaine FALCON, MD  tiZANidine  (ZANAFLEX ) 2 MG tablet Take 2 mg by mouth at bedtime. Patient not taking: Reported on 05/02/2023 02/19/23   [provider]  zonisamide  (ZONEGRAN ) 100 MG capsule Take 2 capsules (200 mg total) by mouth daily. Patient not taking: Reported on 06/12/2023 01/12/23   Patsy Lenis, MD      Allergies    Iron  dextran, Aspirin , and Penicillins    Review of Systems   Review of Systems  Constitutional:  Negative for fever.  Respiratory:  Positive for shortness of breath.   Cardiovascular:  Negative for chest pain and leg swelling.  Gastrointestinal:  Positive for abdominal pain. Negative for blood in stool, diarrhea, nausea and vomiting.  Genitourinary:  Negative for flank pain and vaginal discharge.    Physical Exam Updated Vital Signs BP 116/70 (BP Location: Right Arm)   Pulse (!) 114   Temp 98.1 F (36.7 C) (Oral)   Resp 20   Ht 5' 2 (1.575  m)   Wt 52.2 kg   LMP 01/08/2011   SpO2 93%   BMI 21.03 kg/m  Physical Exam Vitals and nursing note reviewed.  Constitutional:      General: She is not in acute distress.    Appearance: Normal appearance. She is not ill-appearing, toxic-appearing or diaphoretic.  HENT:     Head: Normocephalic and atraumatic.     Nose: Nose normal.     Mouth/Throat:     Mouth: Mucous membranes are moist.     Pharynx: Oropharynx is clear.  Eyes:     Extraocular Movements: Extraocular movements intact.     Conjunctiva/sclera: Conjunctivae normal.     Pupils: Pupils are equal, round, and reactive to light.  Cardiovascular:     Rate and Rhythm: Regular rhythm. Tachycardia present.  Pulmonary:     Effort: Pulmonary effort is normal.     Breath sounds: Wheezing present.  Abdominal:  General: Abdomen is flat. Bowel sounds are normal.     Palpations: Abdomen is soft.     Tenderness: There is abdominal tenderness.     Comments: Generalized abdominal tenderness  Musculoskeletal:     Cervical back: Normal range of motion and neck supple. No tenderness.  Skin:    General: Skin is warm and dry.     Capillary Refill: Capillary refill takes less than 2 seconds.  Neurological:     Mental Status: She is alert and oriented to person, place, and time.     ED Results / Procedures / Treatments   Labs (all labs ordered are listed, but only abnormal results are displayed) Labs Reviewed  BASIC METABOLIC PANEL - Abnormal; Notable for the following components:      Result Value   Potassium 3.3 (*)    CO2 20 (*)    Glucose, Bld 225 (*)    BUN <5 (*)    Calcium  8.1 (*)    All other components within normal limits  CBC WITH DIFFERENTIAL/PLATELET - Abnormal; Notable for the following components:   Hemoglobin 7.9 (*)    HCT 29.2 (*)    MCV 72.3 (*)    MCH 19.6 (*)    MCHC 27.1 (*)    RDW 21.8 (*)    Platelets 434 (*)    All other components within normal limits  RESP PANEL BY RT-PCR (RSV, FLU A&B,  COVID)  RVPGX2  ETHANOL  TROPONIN I (HIGH SENSITIVITY)  TROPONIN I (HIGH SENSITIVITY)    EKG EKG Interpretation Date/Time:  Saturday June 27 2023 18:47:40 EST Ventricular Rate:  113 PR Interval:  134 QRS Duration:  82 QT Interval:  328 QTC Calculation: 449 R Axis:   44  Text Interpretation: Sinus tachycardia Otherwise normal ECG When compared with ECG of 24-Jun-2023 19:06, PREVIOUS ECG IS PRESENT Confirmed by Elnor Savant ([id number]) on 06/28/2023 4:00:14 AM  Radiology CT ABDOMEN PELVIS W CONTRAST Result Date: 06/28/2023 CLINICAL DATA:  Abdominal pain EXAM: CT ABDOMEN AND PELVIS WITH CONTRAST TECHNIQUE: Multidetector CT imaging of the abdomen and pelvis was performed using the standard protocol following bolus administration of intravenous contrast. RADIATION DOSE REDUCTION: This exam was performed according to the departmental dose-optimization program which includes automated exposure control, adjustment of the mA and/or kV according to patient size and/or use of iterative reconstruction technique. CONTRAST:  75mL OMNIPAQUE  IOHEXOL  350 MG/ML SOLN COMPARISON:  03/18/2023 FINDINGS: Lower chest: No acute findings.  Small hiatal hernia. Hepatobiliary: No focal hepatic abnormality. Gallbladder unremarkable. Pancreas: No focal abnormality or ductal dilatation. Spleen: No focal abnormality.  Normal size. Adrenals/Urinary Tract: No adrenal abnormality. No focal renal abnormality. No stones or hydronephrosis. Urinary bladder is unremarkable. Stomach/Bowel: Prior gastric bypass. Stomach, large and small bowel grossly unremarkable. Vascular/Lymphatic: Aortic atherosclerosis. No evidence of aneurysm or adenopathy. Reproductive: No visible focal abnormality. Other: No free fluid or free air. Musculoskeletal: Prior right hip replacement. No acute bony abnormality. IMPRESSION: No acute findings in the abdomen or pelvis. Prior gastric bypass.  No visible complicating feature. Aortic atherosclerosis. Electronically  Signed   By: Franky Crease M.D.   On: 06/28/2023 02:01   CT Angio Chest PE W and/or Wo Contrast Result Date: 06/28/2023 CLINICAL DATA:  Shortness of breath EXAM: CT ANGIOGRAPHY CHEST WITH CONTRAST TECHNIQUE: Multidetector CT imaging of the chest was performed using the standard protocol during bolus administration of intravenous contrast. Multiplanar CT image reconstructions and MIPs were obtained to evaluate the vascular anatomy. RADIATION DOSE REDUCTION: This exam was  performed according to the departmental dose-optimization program which includes automated exposure control, adjustment of the mA and/or kV according to patient size and/or use of iterative reconstruction technique. CONTRAST:  75mL OMNIPAQUE  IOHEXOL  350 MG/ML SOLN COMPARISON:  02/14/2023 FINDINGS: Cardiovascular: No filling defects in the pulmonary arteries to suggest pulmonary emboli. Heart is normal size. Aorta is normal caliber. Prominent pulmonary arterial trunk again noted, stable compatible with pulmonary arterial hypertension. Scattered coronary artery calcifications. Mediastinum/Nodes: No mediastinal, hilar, or axillary adenopathy. Trachea and esophagus are unremarkable. Thyroid  unremarkable. Lungs/Pleura: Chronic peribronchial thickening. No confluent opacities or effusions. Upper Abdomen: No acute findings Musculoskeletal: Chest wall soft tissues are unremarkable. No acute bony abnormality. Chronic compression fractures are noted at T4, T6 and T9 with progression at T4 since prior study. Review of the MIP images confirms the above findings. IMPRESSION: No evidence of pulmonary embolus. Stable changes of pulmonary arterial hypertension. Stable chronic peribronchial thickening.  No confluent opacities. Coronary artery disease. No acute cardiopulmonary disease. Chronic compression fractures at T4, T6 and T9 with compression at T4 since prior study. Electronically Signed   By: Franky Crease M.D.   On: 06/28/2023 01:42   DG Chest 2  View Result Date: 06/27/2023 CLINICAL DATA:  Shortness of breath EXAM: CHEST - 2 VIEW COMPARISON:  Chest x-ray 06/11/2023 FINDINGS: The heart size and mediastinal contours are within normal limits. Both lungs are clear. The visualized skeletal structures are unremarkable. IMPRESSION: No active cardiopulmonary disease. Electronically Signed   By: Greig Pique M.D.   On: 06/27/2023 19:29    Procedures Procedures   Medications Ordered in ED Medications  ipratropium-albuterol  (DUONEB) 0.5-2.5 (3) MG/3ML nebulizer solution 3 mL (3 mLs Nebulization Given 06/27/23 2309)  methylPREDNISolone  sodium succinate  (SOLU-MEDROL ) 125 mg/2 mL injection 125 mg (125 mg Intravenous Given 06/28/23 0027)  magnesium  sulfate IVPB 2 g 50 mL (0 g Intravenous Stopped 06/28/23 0128)  ipratropium-albuterol  (DUONEB) 0.5-2.5 (3) MG/3ML nebulizer solution 3 mL (3 mLs Nebulization Given 06/28/23 0113)  iohexol  (OMNIPAQUE ) 350 MG/ML injection 75 mL (75 mLs Intravenous Contrast Given 06/28/23 0123)  albuterol  (PROVENTIL ) (2.5 MG/3ML) 0.083% nebulizer solution (10 mg/hr Nebulization Given 06/28/23 0200)    ED Course/ Medical Decision Making/ A&P Clinical Course as of 06/28/23 0422  Sat Jun 27, 2023  2229 DG Chest 2 View [CG]    Clinical Course User Index [CG] Ruthell Lonni FALCON, PA-C   Medical Decision Making Amount and/or Complexity of Data Reviewed Labs: ordered. Radiology: ordered. Decision-making details documented in ED Course.  Risk Prescription drug management.   58 year old female presents for evaluation of shortness of breath and abdominal pain.  Please see HPI for further details.  On examination patient is afebrile, tachycardic.  Lung sounds have wheezing throughout, not hypoxic.  Abdomen is soft and compressible throughout with some generalized tenderness.  Neurological examinations at baseline.  Patient CBC without leukocytosis, hemoglobin is baseline.  Metabolic panel with potassium 3.3, glucose 225, anion gap  14.  Patient ethanol is undetectable.  Troponin is 3, 3 with delta.  Viral panel negative for all.  CT abdomen pelvis unremarkable.  CTA shows no evidence of PE.  Chest x-ray clear.  Patient was treated with 3 rounds of duo nebulizers.  Also treated with 125 Solu-Medrol .  Also given 2 g of mag sulfate.  On reassessment after third toe with nebulizer patient wheezing has subsided.  She reports that she feels better.  Patient is still tachycardic however on chart review patient remains persistently tachycardic and is typically discharged tachycardia.  Will discharge patient home and advised her to follow-up with her PCP.  Have encouraged her to continue with crack [place] cessation.  Have encouraged her to return to the ED with any new or worsening signs or symptoms.  Stable to discharge home.  Will send home with a few days of steroids.  Patient not hypoxic on ambulation.  Final Clinical Impression(s) / ED Diagnoses Final diagnoses:  COPD exacerbation (HCC)  Generalized abdominal pain    Rx / DC Orders ED Discharge Orders          Ordered    predniSONE  (DELTASONE ) 20 MG tablet  Daily        06/28/23 0422              Ruthell Lonni FALCON, PA-C 06/28/23 0422    Franklyn Sid SAILOR, MD 06/28/23 [phone]

## 2023-06-27 NOTE — ED Triage Notes (Signed)
 Pt reports SHOB x45 minutes. H/x COPD, asthma 5mg  albuterol en route VSS

## 2023-06-27 NOTE — ED Notes (Signed)
Chris PA at bedside   

## 2023-06-28 ENCOUNTER — Emergency Department (HOSPITAL_COMMUNITY): Payer: 59

## 2023-06-28 DIAGNOSIS — J441 Chronic obstructive pulmonary disease with (acute) exacerbation: Secondary | ICD-10-CM | POA: Diagnosis not present

## 2023-06-28 LAB — ETHANOL: Alcohol, Ethyl (B): <10 mg/dL (ref <10)

## 2023-06-28 MED ORDER — IOHEXOL 350 MG/ML SOLN
75.0000 mL | Freq: Once | INTRAVENOUS | Status: AC | PRN
  Administered 2023-06-28: 75 mL via INTRAVENOUS

## 2023-06-28 MED ORDER — PREDNISONE 20 MG PO TABS: 40.0000 mg | ORAL_TABLET | Freq: Every day | ORAL | 0 refills | Status: AC

## 2023-06-28 MED ORDER — IPRATROPIUM-ALBUTEROL 0.5-2.5 (3) MG/3ML IN SOLN
3.0000 mL | Freq: Once | RESPIRATORY_TRACT | Status: AC
  Administered 2023-06-28: 3 mL via RESPIRATORY_TRACT
  Filled 2023-06-28: qty 3

## 2023-06-28 MED ORDER — ALBUTEROL SULFATE (2.5 MG/3ML) 0.083% IN NEBU
10.0000 mg/h | INHALATION_SOLUTION | Freq: Once | RESPIRATORY_TRACT | Status: AC
  Administered 2023-06-28: 10 mg/h via RESPIRATORY_TRACT
  Filled 2023-06-28: qty 12

## 2023-06-28 NOTE — ED Notes (Signed)
 Patient transported to CT via stretcher.

## 2023-06-28 NOTE — Discharge Instructions (Signed)
 Please begin taking steroids beginning on 1/6.  You will take steroids for the next 4 days.  Please follow-up with your PCP for reevaluation.  Please continue to discontinue use of crack cocaine.  Please return to the ED with any new or worsening signs or symptoms.

## 2023-06-29 LAB — CULTURE, BLOOD (ROUTINE X 2)
Culture: NO GROWTH
Special Requests: ADEQUATE

## 2023-07-07 ENCOUNTER — Emergency Department (HOSPITAL_COMMUNITY): Payer: 59

## 2023-07-07 ENCOUNTER — Emergency Department (HOSPITAL_COMMUNITY)
Admission: EM | Admit: 2023-07-07 | Discharge: 2023-07-07 | Disposition: A | Discharge status: Home / Self Care | Payer: 59 | Attending: Emergency Medicine | Admitting: Emergency Medicine

## 2023-07-07 DIAGNOSIS — F10231 Alcohol dependence with withdrawal delirium: Secondary | ICD-10-CM | POA: Diagnosis not present

## 2023-07-07 DIAGNOSIS — X58XXXA Exposure to other specified factors, initial encounter: Secondary | ICD-10-CM | POA: Insufficient documentation

## 2023-07-07 DIAGNOSIS — Y901 Blood alcohol level of 20-39 mg/100 ml: Secondary | ICD-10-CM | POA: Insufficient documentation

## 2023-07-07 DIAGNOSIS — R000 Tachycardia, unspecified: Secondary | ICD-10-CM | POA: Insufficient documentation

## 2023-07-07 DIAGNOSIS — D649 Anemia, unspecified: Secondary | ICD-10-CM | POA: Insufficient documentation

## 2023-07-07 DIAGNOSIS — R569 Unspecified convulsions: Secondary | ICD-10-CM | POA: Diagnosis present

## 2023-07-07 DIAGNOSIS — S0083XA Contusion of other part of head, initial encounter: Secondary | ICD-10-CM | POA: Insufficient documentation

## 2023-07-07 LAB — CBC
HCT: 28.9 % — ABNORMAL LOW (ref 36.0–46.0)
Hemoglobin: 8.2 g/dL — ABNORMAL LOW (ref 12.0–15.0)
MCH: 19.5 pg — ABNORMAL LOW (ref 26.0–34.0)
MCHC: 28.4 g/dL — ABNORMAL LOW (ref 30.0–36.0)
MCV: 68.8 fL — ABNORMAL LOW (ref 80.0–100.0)
Platelets: 304 10*3/uL (ref 150–400)
RBC: 4.2 MIL/uL (ref 3.87–5.11)
RDW: 20.9 % — ABNORMAL HIGH (ref 11.5–15.5)
WBC: 9 10*3/uL (ref 4.0–10.5)
nRBC: 0 % (ref 0.0–0.2)

## 2023-07-07 LAB — COMPREHENSIVE METABOLIC PANEL
ALT: 13 U/L (ref 0–44)
AST: 19 U/L (ref 15–41)
Albumin: 3 g/dL — ABNORMAL LOW (ref 3.5–5.0)
Alkaline Phosphatase: 80 U/L (ref 38–126)
Anion gap: 7 (ref 5–15)
BUN: <5 mg/dL — ABNORMAL LOW (ref 6–20)
CO2: 24 mmol/L (ref 22–32)
Calcium: 8.2 mg/dL — ABNORMAL LOW (ref 8.9–10.3)
Chloride: 109 mmol/L (ref 98–111)
Creatinine, Ser: 0.42 mg/dL — ABNORMAL LOW (ref 0.44–1.00)
GFR, Estimated: >60 mL/min (ref >60)
Glucose, Bld: 156 mg/dL — ABNORMAL HIGH (ref 70–99)
Potassium: 4.6 mmol/L (ref 3.5–5.1)
Sodium: 140 mmol/L (ref 135–145)
Total Bilirubin: 0.6 mg/dL (ref 0.0–1.2)
Total Protein: 6.4 g/dL — ABNORMAL LOW (ref 6.5–8.1)

## 2023-07-07 LAB — ETHANOL: Alcohol, Ethyl (B): 33 mg/dL — ABNORMAL HIGH (ref <10)

## 2023-07-07 LAB — CBG MONITORING, ED: Glucose-Capillary: 145 mg/dL — ABNORMAL HIGH (ref 70–99)

## 2023-07-07 LAB — MAGNESIUM: Magnesium: 2.1 mg/dL (ref 1.7–2.4)

## 2023-07-07 MED ORDER — LEVETIRACETAM ER 500 MG PO TB24
500.0000 mg | ORAL_TABLET | Freq: Once | ORAL | Status: AC
  Administered 2023-07-07: 500 mg via ORAL
  Filled 2023-07-07: qty 1

## 2023-07-07 MED ORDER — LEVETIRACETAM ER 500 MG PO TB24: 500.0000 mg | ORAL_TABLET | Freq: Two times a day (BID) | ORAL | 3 refills | Status: DC

## 2023-07-07 MED ORDER — DIVALPROEX SODIUM 500 MG PO DR TAB: 500.0000 mg | DELAYED_RELEASE_TABLET | Freq: Two times a day (BID) | ORAL | 3 refills | Status: DC

## 2023-07-07 MED ORDER — LORAZEPAM 2 MG/ML IJ SOLN
1.0000 mg | INTRAMUSCULAR | Status: DC | PRN
  Administered 2023-07-07: 2 mg via INTRAVENOUS
  Filled 2023-07-07: qty 1

## 2023-07-07 MED ORDER — DIVALPROEX SODIUM 250 MG PO DR TAB
500.0000 mg | DELAYED_RELEASE_TABLET | Freq: Once | ORAL | Status: AC
  Administered 2023-07-07: 500 mg via ORAL
  Filled 2023-07-07: qty 2

## 2023-07-07 MED ORDER — LORAZEPAM 1 MG PO TABS: 1.0000 mg | ORAL_TABLET | ORAL | Status: DC | PRN

## 2023-07-07 MED ORDER — FOLIC ACID 1 MG PO TABS
1.0000 mg | ORAL_TABLET | Freq: Every day | ORAL | Status: DC
Start: 2023-07-07 — End: 2023-07-07
  Administered 2023-07-07: 1 mg via ORAL
  Filled 2023-07-07: qty 1

## 2023-07-07 MED ORDER — PHENOBARBITAL SODIUM 130 MG/ML IJ SOLN: 130.0000 mg | Freq: Once | INTRAMUSCULAR | Status: DC

## 2023-07-07 MED ORDER — THIAMINE HCL 100 MG/ML IJ SOLN
100.0000 mg | Freq: Every day | INTRAMUSCULAR | Status: DC
Start: 2023-07-07 — End: 2023-07-07

## 2023-07-07 MED ORDER — THIAMINE MONONITRATE 100 MG PO TABS
100.0000 mg | ORAL_TABLET | Freq: Every day | ORAL | Status: DC
  Administered 2023-07-07: 100 mg via ORAL
  Filled 2023-07-07: qty 1

## 2023-07-07 NOTE — Discharge Instructions (Addendum)
   Outpatient Substance Abuse  Treatment- uninsured  Narcotics Anonymous 24-HOUR HELPLINE Pre-recorded for Meeting Schedules [facility] [phone]  [email or web address] ALCOHOLICS ANONYMOUS  [place]   Answering Service [phone] Please Note: All [place] Meetings are Non-smoking [email or web address]  Alcohol and Drug Services -  Insurance: Medicaid /State funding/private insurance Methadone, suboxone/Intensive outpatient  [place] [phone] Fax: [phone] [address] [place], [place], [address] [place] [phone] Fax: [phone]    [address] [place], [place], [address] ([address] [place], [place], [place], [place], [place], [place], [place], [place]) [facility] [email or web address] Accepts State funding/Medicaid Transitional housing, Intensive Outpatient Treatment, Outpatient treatment, Veterans Services  Phone: [phone] Fax: [phone] Address: [address] [place] [address]   [facility] ([email or web address]/) Insurance: Medicaid Case Management, Administrator, arts, Medication Management, Outpatient Therapy, Psychosocial Rehabilitation, Substance Abuse Intensive Outpatient  Phone: [phone] Fax: [phone], [place], [place], [address]  [facility] Medicaid, most private insurance providers Types of Program: Individual/Group Therapy, Substance Abuse Treatment  Phone: [place] [phone] Fax: [phone] [address], [place], [place], [address] [place] [phone] [address], [address] [place], [place], [address]  [facility]  Medicaid Types of Program: SAIOP  Phone: [phone] Fax: [phone] [address] [place], [place], [place], [address] [facility] Medicaid/state funds Crisis line [phone] [place] [facility] [phone] [place] [phone] [place] [phone]  [facility] [address];  [place], [place] [address] [phone]  Substance Abuse Intensive Outpatient Program [facility] [address] [place], [place] [address] [phone]- Substance abuse treatment  Successful Transitions  Insurance: [facility], [address], sliding scale Types of Program: substance abuse treatment, transportation assistance Phone: [phone] Fax: [phone] Address: [address], [place] [address] [facility] ([email or web address]) Insurance: UHC, [place], [place], [place] of [place] Program: addiction counseling, detoxification,  Phone: [phone]  Fax: [phone] Address: [address], [place] [address]  [facility] (statewide facilities/programs) [address] (Medicaid/state funds) [place], [place] [address]                      [email or web address]/ [phone] [place] mcalpine- [phone] [place]- [phone] [facility] [place] (2 Locations) (Medicaid/state funds) --[address]  walk in 8:30-12 and 1-2:30 [place], [id number]   [facility]- [phone] --[address] [place], [place] [address]  [phone] walk in 8:30-12 and 2-3:30  [facility] state funds/medicaid [address] [place], [place] [address] [id number] [facility] (Suboxone clinic) Medicaid/state funds  [address] [place], [place] [address] [phone]   [facility]  [address] [place], [place] [address]  [phone] (24 hours) [place]- [address] [place], [place] [address]  [phone] (24 hours) [place]- [address] [place] [phone] [place]- [address] [place] [phone] [place] [address] [place]ita [place] [phone] [facility]- Medicaid and state funds  [place]- [address] [place], [place] [address] [phone] (24 hours) [place]- [address] [place], [place]  [phone] [facility]- [address] [place], [place] [address] [phone] (24 hours) [place] [address] [place], [place]  [phone] [place]- [address] [place] [phone]

## 2023-07-07 NOTE — ED Provider Notes (Signed)
 [place] EMERGENCY DEPARTMENT AT [facility] Provider Note   CSN: [id number] Arrival date & time: 07/07/23  0423     History  Chief Complaint  Patient presents with   Seizures    [name] is a 58 y.o. female.   Seizures    58 year old female with medical history significant for alcohol  abuse and alcohol  withdrawal seizures who presents to the emergency department after a reported seizure last night.  The patient states that she normally drinks a 40 daily.  She endorses a history of alcohol  withdrawal and seizures.  She is unclear as to how long she sees but does state that she bit her tongue.  No known urinary incontinence.  She did experience head trauma and developed a hematoma on her forehead.  She has since returned back to her baseline.  She was administered Zofran  due to concern for mild nausea.  She arrives GCS 15, ABC intact.  Home Medications Prior to Admission medications   Medication Sig Start Date End Date Taking? Authorizing Provider  albuterol  (VENTOLIN  HFA) 108 (90 Base) MCG/ACT inhaler Inhale 1-2 puffs into the lungs every 6 (six) hours as needed for wheezing or shortness of breath. 04/01/23   Francesca Elsie LITTIE, MD  ARIPiprazole  (ABILIFY ) 10 MG tablet Take 1 tablet (10 mg total) by mouth daily. Patient not taking: Reported on 05/02/2023 05/02/23   Cam Charlie Loving, DO  budesonide -formoterol  (SYMBICORT ) 160-4.5 MCG/ACT inhaler Inhale 2 puffs into the lungs 2 (two) times daily. 02/25/23   Theadore Ozell HERO, MD  cyanocobalamin  (VITAMIN B12) 1000 MCG tablet Take 1 tablet (1,000 mcg total) by mouth daily. Patient not taking: Reported on 06/12/2023 03/16/23 07/24/23  Perri DELENA Meliton Mickey., MD  divalproex  (DEPAKOTE ) 500 MG DR tablet Take 1 tablet (500 mg total) by mouth every 12 (twelve) hours. 05/01/23   Cam Charlie Loving, DO  famotidine  (PEPCID ) 20 MG tablet Take 1 tablet (20 mg total) by mouth daily. Patient not taking: Reported on 05/02/2023  02/25/23   Theadore Ozell HERO, MD  ferrous sulfate  325 (65 FE) MG tablet Take 1 tablet (325 mg total) by mouth every other day. Patient not taking: Reported on 05/02/2023 03/18/23 10/02/23  Perri DELENA Meliton Mickey., MD  fluticasone -salmeterol (ADVAIR) 250-50 MCG/ACT AEPB Inhale 1 puff into the lungs in the morning and at bedtime.    [provider]  furosemide  (LASIX ) 20 MG tablet Take 1 tablet (20 mg total) by mouth daily. Patient not taking: Reported on 05/02/2023 02/24/23   Bernis Ernst, PA-C  gabapentin  (NEURONTIN ) 300 MG capsule Take 1 capsule (300 mg total) by mouth 3 (three) times daily. Patient not taking: Reported on 05/02/2023 05/01/23   Cam Charlie Loving, DO  hyoscyamine  (LEVSIN AMIEL) 0.125 MG SL tablet Place 1 tablet (0.125 mg total) under the tongue every 4 (four) hours as needed (pain). Up to 1.25 mg daily Patient not taking: Reported on 05/02/2023 01/23/23   Harris, Abigail, PA-C  ipratropium-albuterol  (DUONEB) 0.5-2.5 (3) MG/3ML SOLN Take 3 mLs by nebulization every 6 (six) hours as needed (for shortness of breath or wheezing).    [provider]  levETIRAcetam  (KEPPRA  XR) 500 MG 24 hr tablet Take 1 tablet (500 mg total) by mouth 2 (two) times daily. 05/01/23   Cam Charlie Loving, DO  metFORMIN  (GLUCOPHAGE -XR) 750 MG 24 hr tablet Take 1 tablet (750 mg total) by mouth daily. 06/17/23 07/17/23  Onuoha, Josephine C, NP  nicotine  (NICODERM CQ  - DOSED IN MG/24 HOURS) 21 mg/24hr patch Place  1 patch (21 mg total) onto the skin daily. 06/16/23   Onuoha, Josephine C, NP  nicotine  polacrilex (NICORETTE ) 2 MG gum Take 1 each (2 mg total) by mouth as needed for smoking cessation. 06/16/23   Onuoha, Josephine C, NP  ondansetron  (ZOFRAN ) 4 MG tablet Take 1 tablet (4 mg total) by mouth every 6 (six) hours. Patient not taking: Reported on 05/02/2023 01/20/23   Prosperi, Christian H, PA-C  ondansetron  (ZOFRAN -ODT) 4 MG disintegrating tablet Take 1 tablet (4 mg total) by mouth every 8 (eight)  hours as needed for nausea or vomiting. Patient not taking: Reported on 05/02/2023 03/01/23   Davis, Jonathon H, MD  OXYGEN  Inhale 4-5 L/min into the lungs as needed (for COPD-related symptoms).    [provider]  pantoprazole  (PROTONIX ) 40 MG tablet Take 1 tablet (40 mg total) by mouth daily. Patient not taking: Reported on 06/12/2023 03/17/23 06/12/23  Perri DELENA Meliton Mickey., MD  sucralfate  (CARAFATE ) 1 g tablet Take 1 tablet (1 g total) by mouth 4 (four) times daily -  with meals and at bedtime. Patient not taking: Reported on 05/02/2023 02/11/23   Horton, Charmaine FALCON, MD  tiZANidine  (ZANAFLEX ) 2 MG tablet Take 2 mg by mouth at bedtime. Patient not taking: Reported on 05/02/2023 02/19/23   [provider]  zonisamide  (ZONEGRAN ) 100 MG capsule Take 2 capsules (200 mg total) by mouth daily. Patient not taking: Reported on 06/12/2023 01/12/23   Patsy Lenis, MD      Allergies    Iron  dextran, Aspirin , and Penicillins    Review of Systems   Review of Systems  Neurological:  Positive for seizures.  All other systems reviewed and are negative.   Physical Exam Updated Vital Signs BP (!) 113/101   Pulse (!) 103   Temp 98.3 F (36.8 C) (Oral)   Resp (!) 22   LMP 01/08/2011   SpO2 98%  Physical Exam Vitals and nursing note reviewed.  Constitutional:      General: She is not in acute distress.    Appearance: She is well-developed.     Comments: GCS 15, ABC intact  HENT:     Head: Normocephalic.     Comments: Mild hematoma to forehead Eyes:     Extraocular Movements: Extraocular movements intact.     Conjunctiva/sclera: Conjunctivae normal.     Pupils: Pupils are equal, round, and reactive to light.  Neck:     Comments: No midline tenderness to palpation of the cervical spine.  Range of motion intact Cardiovascular:     Rate and Rhythm: Regular rhythm. Tachycardia present.     Pulses: Normal pulses.  Pulmonary:     Effort: Pulmonary effort is normal. No respiratory  distress.     Breath sounds: Normal breath sounds.  Chest:     Comments: Clavicles stable nontender to AP compression.  Chest wall stable and nontender to AP and lateral compression. Abdominal:     Palpations: Abdomen is soft.     Tenderness: There is no abdominal tenderness.     Comments: Pelvis stable to lateral compression  Musculoskeletal:     Cervical back: Neck supple.     Comments: No midline tenderness to palpation of the thoracic or lumbar spine.  Extremities atraumatic with intact range of motion  Skin:    General: Skin is warm and dry.  Neurological:     Mental Status: She is alert.     Comments: Cranial nerves II through XII grossly intact.  Moving all 4  extremities spontaneously.  Sensation grossly intact all 4 extremities     ED Results / Procedures / Treatments   Labs (all labs ordered are listed, but only abnormal results are displayed) Labs Reviewed  ETHANOL - Abnormal; Notable for the following components:      Result Value   Alcohol , Ethyl (B) 33 (*)    All other components within normal limits  COMPREHENSIVE METABOLIC PANEL - Abnormal; Notable for the following components:   Glucose, Bld 156 (*)    BUN <5 (*)    Creatinine, Ser 0.42 (*)    Calcium  8.2 (*)    Total Protein 6.4 (*)    Albumin  3.0 (*)    All other components within normal limits  CBC - Abnormal; Notable for the following components:   Hemoglobin 8.2 (*)    HCT 28.9 (*)    MCV 68.8 (*)    MCH 19.5 (*)    MCHC 28.4 (*)    RDW 20.9 (*)    All other components within normal limits  CBG MONITORING, ED - Abnormal; Notable for the following components:   Glucose-Capillary 145 (*)    All other components within normal limits  MAGNESIUM     EKG EKG Interpretation Date/Time:  Tuesday July 07 2023 06:15:13 EST Ventricular Rate:  103 PR Interval:  120 QRS Duration:  78 QT Interval:  324 QTC Calculation: 424 R Axis:   34  Text Interpretation: Sinus tachycardia Cannot rule out Anterior  infarct , age undetermined Abnormal ECG When compared with ECG of 27-Jun-2023 18:47, PREVIOUS ECG IS PRESENT Confirmed by Jerrol Agent ([id number]) on 07/07/2023 6:40:02 AM  Radiology DG Chest Portable 1 View Result Date: 07/07/2023 CLINICAL DATA:  58 year old female with history of trauma from a fall. Seizure. EXAM: PORTABLE CHEST 1 VIEW COMPARISON:  Chest x-ray 06/27/2023. FINDINGS: Lung volumes are normal. No consolidative airspace disease. No pleural effusions. No pneumothorax. No pulmonary nodule or mass noted. Pulmonary vasculature and the cardiomediastinal silhouette are within normal limits. IMPRESSION: No radiographic evidence of acute cardiopulmonary disease. Electronically Signed   By: Toribio Aye M.D.   On: 07/07/2023 05:58   CT HEAD WO CONTRAST ( ) Result Date: 07/07/2023 CLINICAL DATA:  Head trauma, moderate to severe. EXAM: CT HEAD WITHOUT CONTRAST TECHNIQUE: Contiguous axial images were obtained from the base of the skull through the vertex without intravenous contrast. RADIATION DOSE REDUCTION: This exam was performed according to the departmental dose-optimization program which includes automated exposure control, adjustment of the mA and/or kV according to patient size and/or use of iterative reconstruction technique. COMPARISON:  06/24/2023 FINDINGS: Brain: No evidence of acute infarction, hemorrhage, hydrocephalus, extra-axial collection or mass lesion/mass effect. Vascular: No hyperdense vessel or unexpected calcification. Skull: Normal. Negative for fracture or focal lesion. Sinuses/Orbits: No acute finding. IMPRESSION: Negative head CT. Electronically Signed   By: Dorn Roulette M.D.   On: 07/07/2023 05:12    Procedures Procedures    Medications Ordered in ED Medications  LORazepam  (ATIVAN ) tablet 1-4 mg ( Oral See Alternative 07/07/23 0517)    Or  LORazepam  (ATIVAN ) injection 1-4 mg (2 mg Intravenous Given 07/07/23 0517)  thiamine  (VITAMIN B1) tablet 100 mg (has no  administration in time range)    Or  thiamine  (VITAMIN B1) injection 100 mg (has no administration in time range)  folic acid  (FOLVITE ) tablet 1 mg (has no administration in time range)    ED Course/ Medical Decision Making/ A&P  Medical Decision Making Amount and/or Complexity of Data Reviewed Labs: ordered. Radiology: ordered.  Risk OTC drugs. Prescription drug management.    58 year old female with medical history significant for alcohol  abuse and alcohol  withdrawal seizures who presents to the emergency department after a reported seizure last night.  The patient states that she normally drinks a 40 daily.  She endorses a history of alcohol  withdrawal and seizures.  She is unclear as to how long she sees but does state that she bit her tongue.  No known urinary incontinence.  She did experience head trauma and developed a hematoma on her forehead.  She has since returned back to her baseline.  She was administered Zofran  due to concern for mild nausea.  She arrives GCS 15, ABC intact.  On arrival, the patient was afebrile, not tachycardic, heart rate 98, mildly tachypneic RR 22, placed on 4 L O2 via nasal cannula, saturating 90% on room air, hemodynamically stable.  Workup initiated in the setting of the patient's head trauma and seizure episode.  CBG was notably 145, ethanol level was 33, CMP revealed hyperglycemia to 156, no significant AKI, no significant electrolyte derangement, CBC without a leukocytosis, mild anemia to 8.2, [place] at the patient's baseline, magnesium  normal.  A chest x-ray reveals no acute cardiac or pulmonary abnormality, CT head revealed no evidence of intracranial injury, no fracture.  The patient's initial CIWA was notably a 12.  She was administered IV Ativan .  Plan at time of signout to reassess the patient's CIWA, ultimate disposition pending improvement versus worsening alcohol  withdrawal requiring admission. Signout given  to Dr. Francesca at 0700.  Final Clinical Impression(s) / ED Diagnoses Final diagnoses:  Alcohol  withdrawal seizure without complication [facility])    Rx / DC Orders ED Discharge Orders     None         Jerrol Agent, MD 07/07/23 [phone]

## 2023-07-07 NOTE — ED Provider Notes (Signed)
  Physical Exam  BP 128/76   Pulse (!) 109   Temp 98.4 F (36.9 C) (Oral)   Resp 20   LMP 01/08/2011   SpO2 100%   Physical Exam Vitals and nursing note reviewed.  Constitutional:      General: She is not in acute distress.    Appearance: She is well-developed.  HENT:     Head: Normocephalic.     Mouth/Throat:     Mouth: Mucous membranes are moist.  Eyes:     Pupils: Pupils are equal, round, and reactive to light.  Cardiovascular:     Rate and Rhythm: Normal rate and regular rhythm.     Heart sounds: No murmur heard. Pulmonary:     Effort: Pulmonary effort is normal. No respiratory distress.     Breath sounds: Normal breath sounds.  Abdominal:     General: Abdomen is flat.     Palpations: Abdomen is soft.     Tenderness: There is no abdominal tenderness.  Musculoskeletal:        General: No tenderness.     Right lower leg: No edema.     Left lower leg: No edema.  Skin:    General: Skin is warm and dry.  Neurological:     General: No focal deficit present.     Mental Status: She is alert. Mental status is at baseline.     Comments: No tremor   Psychiatric:        Mood and Affect: Mood normal.        Behavior: Behavior normal.     Procedures  Procedures  ED Course / MDM   Clinical Course as of 07/07/23 0908  Tue Jul 07, 2023  0708 Received sign out from Dr. Jerrol pending re-assessment.  [WS]  0901 Reassessed patient.  She reports that currently she feels well, has no complaints.  She denies feeling like she is undergoing alcohol  withdrawal.  CIWA score is 0.  She has no tremor.  She does have very mild tachycardia but denies any symptoms like chest pain, shortness of breath.  She does report that she was out of her seizure medication since yesterday.  Spoke with the patient's daughter [name], who request that we refill the patient's seizure medications.  Will give her a dose of her Keppra  and Depakote .  Patient has a follow-up with neurology later this month  for her seizures.  She was admitted in March 2024 and had an EEG suggestive of seizures. Will discharge patient to home. All questions answered. Patient comfortable with plan of discharge. Return precautions discussed with patient and specified on the after visit summary.  [WS]    Clinical Course User Index [WS] Francesca Elsie CROME, MD   Medical Decision Making Amount and/or Complexity of Data Reviewed Labs: ordered. Radiology: ordered.  Risk OTC drugs. Prescription drug management.         Francesca Elsie CROME, MD 07/07/23 [phone]

## 2023-07-07 NOTE — Progress Notes (Signed)
 CSW received consult for patient for substance abuse resources - resources were added to AVS.  CSW spoke with [name], patient's daughter who states patient has been making efforts to take her seizure medication as prescribed but needs a refill. [name] states patient's next neurology appointment is 07/15/23. [name] states once patient is medically stable for discharge, she will arrange transportation via [place].  Niels Portugal, MSW, LCSW Transitions of Care  Clinical Social Worker II [phone]

## 2023-07-07 NOTE — ED Triage Notes (Signed)
 Pt arrives via ems to the er for the c/o seizures during the day and last night. Ems states pt has been drinking alcohol , found on floor. Pt c/o abd pain, ems states knot on pt head so possibly hit head also. VSS per ems. Zofran  given by ems. Pt has a hx of seizures.

## 2023-07-09 ENCOUNTER — Emergency Department (HOSPITAL_COMMUNITY)
Admission: EM | Admit: 2023-07-09 | Discharge: 2023-07-09 | Discharge status: Against Medical Advice | Payer: 59 | Attending: Emergency Medicine | Admitting: Emergency Medicine

## 2023-07-09 ENCOUNTER — Encounter (HOSPITAL_COMMUNITY): Payer: Self-pay | Admitting: *Deleted

## 2023-07-09 ENCOUNTER — Other Ambulatory Visit: Payer: Self-pay

## 2023-07-09 DIAGNOSIS — R112 Nausea with vomiting, unspecified: Secondary | ICD-10-CM | POA: Diagnosis not present

## 2023-07-09 DIAGNOSIS — Z5321 Procedure and treatment not carried out due to patient leaving prior to being seen by health care provider: Secondary | ICD-10-CM | POA: Insufficient documentation

## 2023-07-09 DIAGNOSIS — R109 Unspecified abdominal pain: Secondary | ICD-10-CM | POA: Diagnosis present

## 2023-07-09 LAB — URINALYSIS, ROUTINE W REFLEX MICROSCOPIC
Bilirubin Urine: NEGATIVE
Glucose, UA: 50 mg/dL — AB
Hgb urine dipstick: NEGATIVE
Ketones, ur: NEGATIVE mg/dL
Leukocytes,Ua: NEGATIVE
Nitrite: NEGATIVE
Protein, ur: NEGATIVE mg/dL
Specific Gravity, Urine: 1.006 (ref 1.005–1.030)
pH: 7 (ref 5.0–8.0)

## 2023-07-09 LAB — CBC
HCT: 30.8 % — ABNORMAL LOW (ref 36.0–46.0)
Hemoglobin: 8.7 g/dL — ABNORMAL LOW (ref 12.0–15.0)
MCH: 19.2 pg — ABNORMAL LOW (ref 26.0–34.0)
MCHC: 28.2 g/dL — ABNORMAL LOW (ref 30.0–36.0)
MCV: 67.8 fL — ABNORMAL LOW (ref 80.0–100.0)
Platelets: 199 10*3/uL (ref 150–400)
RBC: 4.54 MIL/uL (ref 3.87–5.11)
RDW: 20.5 % — ABNORMAL HIGH (ref 11.5–15.5)
WBC: 7.2 10*3/uL (ref 4.0–10.5)
nRBC: 0 % (ref 0.0–0.2)

## 2023-07-09 LAB — COMPREHENSIVE METABOLIC PANEL
ALT: 14 U/L (ref 0–44)
AST: 27 U/L (ref 15–41)
Albumin: 3.1 g/dL — ABNORMAL LOW (ref 3.5–5.0)
Alkaline Phosphatase: 97 U/L (ref 38–126)
Anion gap: 17 — ABNORMAL HIGH (ref 5–15)
BUN: <5 mg/dL — ABNORMAL LOW (ref 6–20)
CO2: 22 mmol/L (ref 22–32)
Calcium: 8.5 mg/dL — ABNORMAL LOW (ref 8.9–10.3)
Chloride: 102 mmol/L (ref 98–111)
Creatinine, Ser: 0.6 mg/dL (ref 0.44–1.00)
GFR, Estimated: >60 mL/min (ref >60)
Glucose, Bld: 209 mg/dL — ABNORMAL HIGH (ref 70–99)
Potassium: 4 mmol/L (ref 3.5–5.1)
Sodium: 141 mmol/L (ref 135–145)
Total Bilirubin: 0.6 mg/dL (ref 0.0–1.2)
Total Protein: 6.9 g/dL (ref 6.5–8.1)

## 2023-07-09 LAB — LIPASE, BLOOD: Lipase: 33 U/L (ref 11–51)

## 2023-07-09 MED ORDER — ONDANSETRON 4 MG PO TBDP
4.0000 mg | ORAL_TABLET | Freq: Once | ORAL | Status: AC
  Administered 2023-07-09: 4 mg via ORAL
  Filled 2023-07-09: qty 1

## 2023-07-09 NOTE — ED Triage Notes (Signed)
Pt arrived with EMS for abd pain with NV

## 2023-07-09 NOTE — ED Notes (Addendum)
Pt had a family member come and pick her up at 0850.

## 2023-07-10 ENCOUNTER — Other Ambulatory Visit: Payer: Self-pay

## 2023-07-10 ENCOUNTER — Emergency Department (HOSPITAL_COMMUNITY)
Admission: EM | Admit: 2023-07-10 | Discharge: 2023-07-11 | Disposition: A | Discharge status: Home / Self Care | Payer: 59 | Attending: Emergency Medicine | Admitting: Emergency Medicine

## 2023-07-10 ENCOUNTER — Encounter (HOSPITAL_COMMUNITY): Payer: Self-pay

## 2023-07-10 DIAGNOSIS — I10 Essential (primary) hypertension: Secondary | ICD-10-CM | POA: Diagnosis not present

## 2023-07-10 DIAGNOSIS — E039 Hypothyroidism, unspecified: Secondary | ICD-10-CM | POA: Diagnosis not present

## 2023-07-10 DIAGNOSIS — R109 Unspecified abdominal pain: Secondary | ICD-10-CM | POA: Insufficient documentation

## 2023-07-10 DIAGNOSIS — Z79899 Other long term (current) drug therapy: Secondary | ICD-10-CM | POA: Diagnosis not present

## 2023-07-10 LAB — CBC WITH DIFFERENTIAL/PLATELET
Abs Immature Granulocytes: 0.02 10*3/uL (ref 0.00–0.07)
Basophils Absolute: 0 10*3/uL (ref 0.0–0.1)
Basophils Relative: 1 %
Eosinophils Absolute: 0.1 10*3/uL (ref 0.0–0.5)
Eosinophils Relative: 1 %
HCT: 30.4 % — ABNORMAL LOW (ref 36.0–46.0)
Hemoglobin: 8.5 g/dL — ABNORMAL LOW (ref 12.0–15.0)
Immature Granulocytes: 0 %
Lymphocytes Relative: 16 %
Lymphs Abs: 1.2 10*3/uL (ref 0.7–4.0)
MCH: 19.1 pg — ABNORMAL LOW (ref 26.0–34.0)
MCHC: 28 g/dL — ABNORMAL LOW (ref 30.0–36.0)
MCV: 68.2 fL — ABNORMAL LOW (ref 80.0–100.0)
Monocytes Absolute: 0.5 10*3/uL (ref 0.1–1.0)
Monocytes Relative: 7 %
Neutro Abs: 5.7 10*3/uL (ref 1.7–7.7)
Neutrophils Relative %: 75 %
Platelets: 231 10*3/uL (ref 150–400)
RBC: 4.46 MIL/uL (ref 3.87–5.11)
RDW: 20.3 % — ABNORMAL HIGH (ref 11.5–15.5)
WBC: 7.6 10*3/uL (ref 4.0–10.5)
nRBC: 0 % (ref 0.0–0.2)

## 2023-07-10 LAB — COMPREHENSIVE METABOLIC PANEL
ALT: 14 U/L (ref 0–44)
AST: 18 U/L (ref 15–41)
Albumin: 3.2 g/dL — ABNORMAL LOW (ref 3.5–5.0)
Alkaline Phosphatase: 106 U/L (ref 38–126)
Anion gap: 13 (ref 5–15)
BUN: 6 mg/dL (ref 6–20)
CO2: 21 mmol/L — ABNORMAL LOW (ref 22–32)
Calcium: 8.7 mg/dL — ABNORMAL LOW (ref 8.9–10.3)
Chloride: 105 mmol/L (ref 98–111)
Creatinine, Ser: 0.48 mg/dL (ref 0.44–1.00)
GFR, Estimated: >60 mL/min (ref >60)
Glucose, Bld: 174 mg/dL — ABNORMAL HIGH (ref 70–99)
Potassium: 4.2 mmol/L (ref 3.5–5.1)
Sodium: 139 mmol/L (ref 135–145)
Total Bilirubin: 0.6 mg/dL (ref 0.0–1.2)
Total Protein: 7 g/dL (ref 6.5–8.1)

## 2023-07-10 LAB — LIPASE, BLOOD: Lipase: 33 U/L (ref 11–51)

## 2023-07-10 LAB — ETHANOL: Alcohol, Ethyl (B): 35 mg/dL — ABNORMAL HIGH (ref <10)

## 2023-07-10 MED ORDER — ONDANSETRON 4 MG PO TBDP
4.0000 mg | ORAL_TABLET | Freq: Once | ORAL | Status: AC
  Administered 2023-07-11: 4 mg via ORAL
  Filled 2023-07-10: qty 1

## 2023-07-10 NOTE — ED Provider Triage Note (Signed)
Emergency Medicine Provider Triage Evaluation Note  [name] , a 58 y.o. female  was evaluated in triage.  Pt complains of upper abdominal pain back pain and nausea vomiting..  Review of Systems  Positive: Upper abdominal pain low back pain and vomiting Negative: Diarrhea  Physical Exam  BP 108/76 (BP Location: Right Arm)   Pulse (!) 110   Temp 98.5 F (36.9 C) (Oral)   Resp 18   Ht 5\' 2"  (1.575 m)   Wt 52.2 kg   LMP 01/08/2011   SpO2 98%   BMI 21.03 kg/m  Upper abdominal tenderness.  No rebound or guarding.  No hernia palpated.  Medical Decision Making  Medically screening exam initiated at 11:04 PM.  Appropriate orders placed.  Ida L Mcfall was informed that the remainder of the evaluation will be completed by another provider, this initial triage assessment does not replace that evaluation, and the importance of remaining in the ED until their evaluation is complete.  Patient with nausea vomiting and abdominal pain.  Also low back pain.  States she has chronic low back pain.  History of alcohol use.  Will get basic blood work and give antiemetics.   Benjiman Core, MD 07/10/23 [phone]

## 2023-07-10 NOTE — ED Triage Notes (Signed)
Pt to ED via [facility] from home c/o lower back pain , generalized abdominal pain, nausea, pt presented to ED yesterday for the same. No medication given by EMS.   Last VS: 132/80, P100, 97%ra. Cbg 159.

## 2023-07-11 DIAGNOSIS — R109 Unspecified abdominal pain: Secondary | ICD-10-CM | POA: Diagnosis not present

## 2023-07-11 MED ORDER — ACETAMINOPHEN 500 MG PO TABS
1000.0000 mg | ORAL_TABLET | Freq: Once | ORAL | Status: AC
  Administered 2023-07-11: 1000 mg via ORAL
  Filled 2023-07-11: qty 2

## 2023-07-11 NOTE — ED Provider Notes (Signed)
EMERGENCY DEPARTMENT AT [facility] Provider Note   CSN: [id number] Arrival date & time: 07/10/23  2237     History  Chief Complaint  Patient presents with   Abdominal Pain   Back Pain    [name] is a 58 y.o. female.  The history is provided by the patient and medical records.  Abdominal Pain Back Pain Associated symptoms: abdominal pain    57 year old female with history of alcohol abuse, chronic anemia, degenerative disc disease, GERD, hypertension, hypothyroidism, presenting to the ED with reported abdominal pain, back pain, nausea, and vomiting.  Came to ED for same yesterday but left prior to seeing provider.  Has had some alcohol today.  By time of my evaluation after prolonged wait, now states she is hungry and wants to eat.  She denies any numbness or weakness of the leg.  No bowel or bladder incontinence.  No medication taken prior to arrival.  Home Medications Prior to Admission medications   Medication Sig Start Date End Date Taking? Authorizing Provider  albuterol (VENTOLIN HFA) 108 (90 Base) MCG/ACT inhaler Inhale 1-2 puffs into the lungs every 6 (six) hours as needed for wheezing or shortness of breath. 04/01/23   Lonell Grandchild, MD  ARIPiprazole (ABILIFY) 10 MG tablet Take 1 tablet (10 mg total) by mouth daily. Patient not taking: Reported on 05/02/2023 05/02/23   Sarina Ill, DO  budesonide-formoterol [facility]) 160-4.5 MCG/ACT inhaler Inhale 2 puffs into the lungs 2 (two) times daily. 02/25/23   Sabas Sous, MD  cyanocobalamin (VITAMIN B12) 1000 MCG tablet Take 1 tablet (1,000 mcg total) by mouth daily. Patient not taking: Reported on 06/12/2023 03/16/23 07/24/23  Zigmund Daniel., MD  divalproex (DEPAKOTE) 500 MG DR tablet Take 1 tablet (500 mg total) by mouth every 12 (twelve) hours. 07/07/23   Lonell Grandchild, MD  famotidine (PEPCID) 20 MG tablet Take 1 tablet (20 mg total) by mouth daily. Patient not taking:  Reported on 05/02/2023 02/25/23   Sabas Sous, MD  ferrous sulfate 325 (65 FE) MG tablet Take 1 tablet (325 mg total) by mouth every other day. Patient not taking: Reported on 05/02/2023 03/18/23 10/02/23  Zigmund Daniel., MD  fluticasone-salmeterol (ADVAIR) 250-50 MCG/ACT AEPB Inhale 1 puff into the lungs in the morning and at bedtime.    [provider]  furosemide (LASIX) 20 MG tablet Take 1 tablet (20 mg total) by mouth daily. Patient not taking: Reported on 05/02/2023 02/24/23   Achille Rich, PA-C  gabapentin (NEURONTIN) 300 MG capsule Take 1 capsule (300 mg total) by mouth 3 (three) times daily. Patient not taking: Reported on 05/02/2023 05/01/23   Sarina Ill, DO  hyoscyamine (LEVSIN/SL) 0.125 MG SL tablet Place 1 tablet (0.125 mg total) under the tongue every 4 (four) hours as needed (pain). Up to 1.25 mg daily Patient not taking: Reported on 05/02/2023 01/23/23   Arthor Captain, PA-C  ipratropium-albuterol (DUONEB) 0.5-2.5 (3) MG/3ML SOLN Take 3 mLs by nebulization every 6 (six) hours as needed (for shortness of breath or wheezing).    [provider]  levETIRAcetam (KEPPRA XR) 500 MG 24 hr tablet Take 1 tablet (500 mg total) by mouth 2 (two) times daily. 07/07/23   Lonell Grandchild, MD  metFORMIN (GLUCOPHAGE-XR) 750 MG 24 hr tablet Take 1 tablet (750 mg total) by mouth daily. 06/17/23 07/17/23  Earney Navy, NP  nicotine (NICODERM CQ - DOSED IN MG/24 HOURS) 21 mg/24hr patch  Place 1 patch (21 mg total) onto the skin daily. 06/16/23   Earney Navy, NP  nicotine polacrilex (NICORETTE) 2 MG gum Take 1 each (2 mg total) by mouth as needed for smoking cessation. 06/16/23   Earney Navy, NP  ondansetron (ZOFRAN) 4 MG tablet Take 1 tablet (4 mg total) by mouth every 6 (six) hours. Patient not taking: Reported on 05/02/2023 01/20/23   Prosperi, Christian H, PA-C  ondansetron (ZOFRAN-ODT) 4 MG disintegrating tablet Take 1 tablet (4 mg total) by mouth  every 8 (eight) hours as needed for nausea or vomiting. Patient not taking: Reported on 05/02/2023 03/01/23   Laurence Spates, MD  OXYGEN Inhale 4-5 L/min into the lungs as needed (for COPD-related symptoms).    [provider]  pantoprazole (PROTONIX) 40 MG tablet Take 1 tablet (40 mg total) by mouth daily. Patient not taking: Reported on 06/12/2023 03/17/23 06/12/23  Zigmund Daniel., MD  sucralfate (CARAFATE) 1 g tablet Take 1 tablet (1 g total) by mouth 4 (four) times daily -  with meals and at bedtime. Patient not taking: Reported on 05/02/2023 02/11/23   Horton, Mayer Masker, MD  tiZANidine (ZANAFLEX) 2 MG tablet Take 2 mg by mouth at bedtime. Patient not taking: Reported on 05/02/2023 02/19/23   [provider]  zonisamide (ZONEGRAN) 100 MG capsule Take 2 capsules (200 mg total) by mouth daily. Patient not taking: Reported on 06/12/2023 01/12/23   Lewie Chamber, MD      Allergies    Iron dextran, Aspirin, and Penicillins    Review of Systems   Review of Systems  Gastrointestinal:  Positive for abdominal pain.  Musculoskeletal:  Positive for back pain.  All other systems reviewed and are negative.   Physical Exam Updated Vital Signs BP 125/77 (BP Location: Left Arm)   Pulse (!) 108   Temp 98.1 F (36.7 C)   Resp 18   Ht 5\' 2"  (1.575 m)   Wt 52.2 kg   LMP 01/08/2011   SpO2 97%   BMI 21.03 kg/m   Physical Exam Vitals and nursing note reviewed.  Constitutional:      Appearance: She is well-developed.  HENT:     Head: Normocephalic and atraumatic.  Eyes:     Conjunctiva/sclera: Conjunctivae normal.     Pupils: Pupils are equal, round, and reactive to light.  Cardiovascular:     Rate and Rhythm: Normal rate and regular rhythm.     Heart sounds: Normal heart sounds.  Pulmonary:     Effort: Pulmonary effort is normal.     Breath sounds: Normal breath sounds.  Abdominal:     General: Bowel sounds are normal.     Palpations: Abdomen is soft.      Tenderness: There is no abdominal tenderness.     Comments: Soft, nontender  Musculoskeletal:        General: Normal range of motion.     Cervical back: Normal range of motion.     Comments: No strength/sensory deficits noted to legs, able to stand and ambulate independently in room, twisting and maneuvering on stretcher without apparently difficulty or pain  Skin:    General: Skin is warm and dry.  Neurological:     Mental Status: She is alert and oriented to person, place, and time.     ED Results / Procedures / Treatments   Labs (all labs ordered are listed, but only abnormal results are displayed) Labs Reviewed  COMPREHENSIVE METABOLIC PANEL - Abnormal; Notable  for the following components:      Result Value   CO2 21 (*)    Glucose, Bld 174 (*)    Calcium 8.7 (*)    Albumin 3.2 (*)    All other components within normal limits  ETHANOL - Abnormal; Notable for the following components:   Alcohol, Ethyl (B) 35 (*)    All other components within normal limits  CBC WITH DIFFERENTIAL/PLATELET - Abnormal; Notable for the following components:   Hemoglobin 8.5 (*)    HCT 30.4 (*)    MCV 68.2 (*)    MCH 19.1 (*)    MCHC 28.0 (*)    RDW 20.3 (*)    All other components within normal limits  LIPASE, BLOOD    EKG None  Radiology No results found.  Procedures Procedures    Medications Ordered in ED Medications  ondansetron (ZOFRAN-ODT) disintegrating tablet 4 mg (4 mg Oral Given 07/11/23 0456)  acetaminophen (TYLENOL) tablet 1,000 mg (1,000 mg Oral Given 07/11/23 0456)    ED Course/ Medical Decision Making/ A&P                                 Medical Decision Making Amount and/or Complexity of Data Reviewed ECG/medicine tests: ordered and independent interpretation performed.  Risk OTC drugs.   58 year old female presenting to the ED with abdominal pain, nausea, vomiting, and back pain.  She has somewhat chronic issues with these.  She had prolonged wait due to  high census, by time of my evaluation she states she is feeling better and wants to eat a sandwich and a Coca-Cola.  She is afebrile and nontoxic in appearance.  Her abdomen is soft and nontender.  Back pain is without any focal neurologic deficits.  She is maneuvering around on ED stretcher without apparent difficulty, turning side-to-side without any apparent pain.  Labs reviewed--no leukocytosis.  Anemia is known and appears at baseline.  Ethanol is 35.  Normal lipase.  No significant electrolyte derangements.  Given reassuring exam and laboratory studies, do not feel she needs advanced imaging such as CT.  Will PO challenge.  Patient was given sandwich and Coca-Cola here, ate and drank without issue.  She has not had any active emesis.  Patient gathered her belongings and ambulated out of the ED shortly after finishing sandwich.  Final Clinical Impression(s) / ED Diagnoses Final diagnoses:  Abdominal pain, unspecified abdominal location    Rx / DC Orders ED Discharge Orders     None         Garlon Hatchet, PA-C 07/11/23 8756    Marily Memos, MD 07/11/23 2324

## 2023-07-15 ENCOUNTER — Encounter (HOSPITAL_COMMUNITY): Payer: Self-pay

## 2023-07-15 ENCOUNTER — Other Ambulatory Visit: Payer: Self-pay

## 2023-07-15 ENCOUNTER — Ambulatory Visit: Payer: 59 | Admitting: Neurology

## 2023-07-15 ENCOUNTER — Telehealth: Payer: Self-pay | Admitting: Neurology

## 2023-07-15 ENCOUNTER — Emergency Department (HOSPITAL_COMMUNITY)
Admission: EM | Admit: 2023-07-15 | Discharge: 2023-07-16 | Disposition: A | Discharge status: Home / Self Care | Payer: 59 | Attending: Emergency Medicine | Admitting: Emergency Medicine

## 2023-07-15 ENCOUNTER — Encounter: Payer: Self-pay | Admitting: Neurology

## 2023-07-15 ENCOUNTER — Emergency Department (HOSPITAL_COMMUNITY): Payer: 59

## 2023-07-15 ENCOUNTER — Emergency Department (HOSPITAL_COMMUNITY)
Admission: EM | Admit: 2023-07-15 | Discharge: 2023-07-15 | Disposition: A | Discharge status: Home / Self Care | Payer: 59 | Attending: Emergency Medicine | Admitting: Emergency Medicine

## 2023-07-15 DIAGNOSIS — R519 Headache, unspecified: Secondary | ICD-10-CM | POA: Insufficient documentation

## 2023-07-15 DIAGNOSIS — I10 Essential (primary) hypertension: Secondary | ICD-10-CM | POA: Insufficient documentation

## 2023-07-15 DIAGNOSIS — J449 Chronic obstructive pulmonary disease, unspecified: Secondary | ICD-10-CM | POA: Insufficient documentation

## 2023-07-15 DIAGNOSIS — Z7951 Long term (current) use of inhaled steroids: Secondary | ICD-10-CM | POA: Insufficient documentation

## 2023-07-15 DIAGNOSIS — F17200 Nicotine dependence, unspecified, uncomplicated: Secondary | ICD-10-CM | POA: Insufficient documentation

## 2023-07-15 DIAGNOSIS — E039 Hypothyroidism, unspecified: Secondary | ICD-10-CM | POA: Insufficient documentation

## 2023-07-15 DIAGNOSIS — R000 Tachycardia, unspecified: Secondary | ICD-10-CM | POA: Insufficient documentation

## 2023-07-15 DIAGNOSIS — E119 Type 2 diabetes mellitus without complications: Secondary | ICD-10-CM | POA: Insufficient documentation

## 2023-07-15 DIAGNOSIS — Z7984 Long term (current) use of oral hypoglycemic drugs: Secondary | ICD-10-CM | POA: Insufficient documentation

## 2023-07-15 DIAGNOSIS — R0602 Shortness of breath: Secondary | ICD-10-CM | POA: Insufficient documentation

## 2023-07-15 DIAGNOSIS — J181 Lobar pneumonia, unspecified organism: Secondary | ICD-10-CM | POA: Insufficient documentation

## 2023-07-15 DIAGNOSIS — J189 Pneumonia, unspecified organism: Secondary | ICD-10-CM

## 2023-07-15 DIAGNOSIS — J441 Chronic obstructive pulmonary disease with (acute) exacerbation: Secondary | ICD-10-CM | POA: Diagnosis not present

## 2023-07-15 DIAGNOSIS — Z79899 Other long term (current) drug therapy: Secondary | ICD-10-CM | POA: Insufficient documentation

## 2023-07-15 DIAGNOSIS — Z20822 Contact with and (suspected) exposure to covid-19: Secondary | ICD-10-CM | POA: Diagnosis not present

## 2023-07-15 DIAGNOSIS — M791 Myalgia, unspecified site: Secondary | ICD-10-CM | POA: Insufficient documentation

## 2023-07-15 LAB — CBC WITH DIFFERENTIAL/PLATELET
Abs Immature Granulocytes: 0.02 10*3/uL (ref 0.00–0.07)
Basophils Absolute: 0 10*3/uL (ref 0.0–0.1)
Basophils Relative: 0 %
Eosinophils Absolute: 0 10*3/uL (ref 0.0–0.5)
Eosinophils Relative: 0 %
HCT: 25.7 % — ABNORMAL LOW (ref 36.0–46.0)
Hemoglobin: 7.3 g/dL — ABNORMAL LOW (ref 12.0–15.0)
Immature Granulocytes: 0 %
Lymphocytes Relative: 19 %
Lymphs Abs: 1 10*3/uL (ref 0.7–4.0)
MCH: 19.2 pg — ABNORMAL LOW (ref 26.0–34.0)
MCHC: 28.4 g/dL — ABNORMAL LOW (ref 30.0–36.0)
MCV: 67.5 fL — ABNORMAL LOW (ref 80.0–100.0)
Monocytes Absolute: 0.5 10*3/uL (ref 0.1–1.0)
Monocytes Relative: 9 %
Neutro Abs: 4 10*3/uL (ref 1.7–7.7)
Neutrophils Relative %: 72 %
Platelets: 199 10*3/uL (ref 150–400)
RBC: 3.81 MIL/uL — ABNORMAL LOW (ref 3.87–5.11)
RDW: 19.9 % — ABNORMAL HIGH (ref 11.5–15.5)
WBC: 5.6 10*3/uL (ref 4.0–10.5)
nRBC: 0 % (ref 0.0–0.2)

## 2023-07-15 LAB — BASIC METABOLIC PANEL
Anion gap: 12 (ref 5–15)
Anion gap: 10 (ref 5–15)
BUN: 6 mg/dL (ref 6–20)
BUN: <5 mg/dL — ABNORMAL LOW (ref 6–20)
CO2: 21 mmol/L — ABNORMAL LOW (ref 22–32)
CO2: 24 mmol/L (ref 22–32)
Calcium: 8.3 mg/dL — ABNORMAL LOW (ref 8.9–10.3)
Calcium: 8.2 mg/dL — ABNORMAL LOW (ref 8.9–10.3)
Chloride: 103 mmol/L (ref 98–111)
Chloride: 103 mmol/L (ref 98–111)
Creatinine, Ser: 0.49 mg/dL (ref 0.44–1.00)
Creatinine, Ser: 0.68 mg/dL (ref 0.44–1.00)
GFR, Estimated: >60 mL/min (ref >60)
GFR, Estimated: >60 mL/min (ref >60)
Glucose, Bld: 154 mg/dL — ABNORMAL HIGH (ref 70–99)
Glucose, Bld: 190 mg/dL — ABNORMAL HIGH (ref 70–99)
Potassium: 3.9 mmol/L (ref 3.5–5.1)
Potassium: 3.6 mmol/L (ref 3.5–5.1)
Sodium: 136 mmol/L (ref 135–145)
Sodium: 137 mmol/L (ref 135–145)

## 2023-07-15 LAB — CBC
HCT: 28.6 % — ABNORMAL LOW (ref 36.0–46.0)
Hemoglobin: 8 g/dL — ABNORMAL LOW (ref 12.0–15.0)
MCH: 19 pg — ABNORMAL LOW (ref 26.0–34.0)
MCHC: 28 g/dL — ABNORMAL LOW (ref 30.0–36.0)
MCV: 67.9 fL — ABNORMAL LOW (ref 80.0–100.0)
Platelets: 192 10*3/uL (ref 150–400)
RBC: 4.21 MIL/uL (ref 3.87–5.11)
RDW: 20.1 % — ABNORMAL HIGH (ref 11.5–15.5)
WBC: 9.1 10*3/uL (ref 4.0–10.5)
nRBC: 0 % (ref 0.0–0.2)

## 2023-07-15 LAB — RESP PANEL BY RT-PCR (RSV, FLU A&B, COVID)  RVPGX2
Influenza A by PCR: NEGATIVE
Influenza B by PCR: NEGATIVE
Resp Syncytial Virus by PCR: NEGATIVE
SARS Coronavirus 2 by RT PCR: NEGATIVE

## 2023-07-15 MED ORDER — AZITHROMYCIN 250 MG PO TABS: 250.0000 mg | ORAL_TABLET | Freq: Every day | ORAL | 0 refills | Status: DC

## 2023-07-15 MED ORDER — ONDANSETRON HCL 4 MG/2ML IJ SOLN
4.0000 mg | Freq: Once | INTRAMUSCULAR | Status: AC
  Administered 2023-07-15: 4 mg via INTRAVENOUS
  Filled 2023-07-15: qty 2

## 2023-07-15 MED ORDER — KETOROLAC TROMETHAMINE 15 MG/ML IJ SOLN
15.0000 mg | Freq: Once | INTRAMUSCULAR | Status: AC
  Administered 2023-07-15: 15 mg via INTRAVENOUS
  Filled 2023-07-15: qty 1

## 2023-07-15 MED ORDER — CEFDINIR 300 MG PO CAPS: 300.0000 mg | ORAL_CAPSULE | Freq: Two times a day (BID) | ORAL | 0 refills | Status: DC

## 2023-07-15 MED ORDER — ACETAMINOPHEN 325 MG PO TABS
650.0000 mg | ORAL_TABLET | Freq: Once | ORAL | Status: AC
  Administered 2023-07-15: 650 mg via ORAL
  Filled 2023-07-15: qty 2

## 2023-07-15 MED ORDER — ONDANSETRON 4 MG PO TBDP: ORAL_TABLET | ORAL | 0 refills | Status: DC

## 2023-07-15 MED ORDER — ACETAMINOPHEN 500 MG PO TABS
1000.0000 mg | ORAL_TABLET | Freq: Once | ORAL | Status: AC
  Administered 2023-07-15: 1000 mg via ORAL
  Filled 2023-07-15: qty 2

## 2023-07-15 MED ORDER — SODIUM CHLORIDE 0.9 % IV SOLN
500.0000 mg | Freq: Once | INTRAVENOUS | Status: AC
  Administered 2023-07-15: 500 mg via INTRAVENOUS
  Filled 2023-07-15 (×2): qty 5

## 2023-07-15 MED ORDER — CEFTRIAXONE SODIUM 1 G IJ SOLR
1.0000 g | Freq: Once | INTRAMUSCULAR | Status: AC
  Administered 2023-07-15: 1 g via INTRAVENOUS
  Filled 2023-07-15: qty 10

## 2023-07-15 MED ORDER — ALBUTEROL SULFATE HFA 108 (90 BASE) MCG/ACT IN AERS: 1.0000 | INHALATION_SPRAY | Freq: Four times a day (QID) | RESPIRATORY_TRACT | 0 refills | Status: DC | PRN

## 2023-07-15 MED ORDER — LACTATED RINGERS IV BOLUS
1000.0000 mL | Freq: Once | INTRAVENOUS | Status: AC
  Administered 2023-07-15: 1000 mL via INTRAVENOUS

## 2023-07-15 NOTE — Telephone Encounter (Signed)
Pt's daughter r/s appt due to bad weather. Wait listed

## 2023-07-15 NOTE — ED Notes (Signed)
Pt ambulated to the bathroom with no issues.

## 2023-07-15 NOTE — ED Notes (Signed)
Pt refused to have let ATB finish running prior to d/c.

## 2023-07-15 NOTE — ED Provider Notes (Signed)
[place] EMERGENCY DEPARTMENT AT [facility] Provider Note   CSN: [id number] Arrival date & time: 07/15/23  1902     History  Chief Complaint  Patient presents with   Shortness of Breath    [name] is a 58 y.o. female with PMH as listed below who presents with SOB, headache, body aches.   Pt here today for PNA. Hx of COPD. Reportedly called EMS for SOB, stated breathing got worse after smoking. Fire gave albuterol and Atrovent. EMS gave 10 mg of Atrovent.  She states she is here in the ED for headache and body aches. She denies being short of breath and states that her breathing is "fine." She endorses feeling hot, says she has been eating and drinking okay. Coughing but not productive. Denies CP, nausea/vomiting, abdominal pain, leg swelling.   Past Medical History:  Diagnosis Date   Abdominal pain    Accidental drug overdose April 2013   Anxiety    Atrial fibrillation (HCC) 09/29/11   converted spontaneously   Chronic back pain    Chronic knee pain    Chronic nausea    Chronic pain    COPD (chronic obstructive pulmonary disease) (HCC)    Depression    Diabetes mellitus    states her doctor took her off all DM meds in past month   Diabetic neuropathy (HCC)    Dyspnea    with exertion    GERD (gastroesophageal reflux disease)    Headache(784.0)    migraines    HTN (hypertension)    not on meds since in a year    Hyperlipidemia    Hypothyroidism    not on meds in a while    Mental disorder    Bipolar and schizophrenic   Nausea and vomiting 01/02/2023   Requires supplemental oxygen    as needed per patient    Schizophrenia (HCC)    Schizophrenia, acute (HCC) 11/13/2017   Tobacco abuse        Home Medications Prior to Admission medications   Medication Sig Start Date End Date Taking? Authorizing Provider  albuterol (VENTOLIN HFA) 108 (90 Base) MCG/ACT inhaler Inhale 1-2 puffs into the lungs every 6 (six) hours as needed for wheezing or  shortness of breath. 07/15/23   Pollina, Canary Brim, MD  ARIPiprazole (ABILIFY) 10 MG tablet Take 1 tablet (10 mg total) by mouth daily. Patient not taking: Reported on 05/02/2023 05/02/23   Sarina Ill, DO  azithromycin (ZITHROMAX) 250 MG tablet Take 1 tablet (250 mg total) by mouth daily. 07/15/23   Gilda Crease, MD  budesonide-formoterol (SYMBICORT) 160-4.5 MCG/ACT inhaler Inhale 2 puffs into the lungs 2 (two) times daily. 02/25/23   Sabas Sous, MD  cefdinir (OMNICEF) 300 MG capsule Take 1 capsule (300 mg total) by mouth 2 (two) times daily. 07/15/23   Gilda Crease, MD  cyanocobalamin (VITAMIN B12) 1000 MCG tablet Take 1 tablet (1,000 mcg total) by mouth daily. Patient not taking: Reported on 06/12/2023 03/16/23 07/24/23  Zigmund Daniel., MD  divalproex (DEPAKOTE) 500 MG DR tablet Take 1 tablet (500 mg total) by mouth every 12 (twelve) hours. 07/07/23   Lonell Grandchild, MD  famotidine (PEPCID) 20 MG tablet Take 1 tablet (20 mg total) by mouth daily. Patient not taking: Reported on 05/02/2023 02/25/23   Sabas Sous, MD  ferrous sulfate 325 (65 FE) MG tablet Take 1 tablet (325 mg total) by mouth every other day. Patient not taking:  Reported on 05/02/2023 03/18/23 10/02/23  Zigmund Daniel., MD  fluticasone-salmeterol (ADVAIR) 250-50 MCG/ACT AEPB Inhale 1 puff into the lungs in the morning and at bedtime.    [provider]  furosemide (LASIX) 20 MG tablet Take 1 tablet (20 mg total) by mouth daily. Patient not taking: Reported on 05/02/2023 02/24/23   Achille Rich, PA-C  gabapentin (NEURONTIN) 300 MG capsule Take 1 capsule (300 mg total) by mouth 3 (three) times daily. Patient not taking: Reported on 05/02/2023 05/01/23   Sarina Ill, DO  hyoscyamine (LEVSIN/SL) 0.125 MG SL tablet Place 1 tablet (0.125 mg total) under the tongue every 4 (four) hours as needed (pain). Up to 1.25 mg daily Patient not taking: Reported on 05/02/2023 01/23/23    Arthor Captain, PA-C  ipratropium-albuterol (DUONEB) 0.5-2.5 (3) MG/3ML SOLN Take 3 mLs by nebulization every 6 (six) hours as needed (for shortness of breath or wheezing).    [provider]  levETIRAcetam (KEPPRA XR) 500 MG 24 hr tablet Take 1 tablet (500 mg total) by mouth 2 (two) times daily. 07/07/23   Lonell Grandchild, MD  metFORMIN (GLUCOPHAGE-XR) 750 MG 24 hr tablet Take 1 tablet (750 mg total) by mouth daily. 06/17/23 07/17/23  Earney Navy, NP  nicotine (NICODERM CQ - DOSED IN MG/24 HOURS) 21 mg/24hr patch Place 1 patch (21 mg total) onto the skin daily. 06/16/23   Earney Navy, NP  nicotine polacrilex (NICORETTE) 2 MG gum Take 1 each (2 mg total) by mouth as needed for smoking cessation. 06/16/23   Earney Navy, NP  ondansetron (ZOFRAN) 4 MG tablet Take 1 tablet (4 mg total) by mouth every 6 (six) hours. Patient not taking: Reported on 05/02/2023 01/20/23   Prosperi, Christian H, PA-C  ondansetron (ZOFRAN-ODT) 4 MG disintegrating tablet 4mg  ODT q4 hours prn nausea/vomit 07/15/23   Pollina, Canary Brim, MD  OXYGEN Inhale 4-5 L/min into the lungs as needed (for COPD-related symptoms).    [provider]  pantoprazole (PROTONIX) 40 MG tablet Take 1 tablet (40 mg total) by mouth daily. Patient not taking: Reported on 06/12/2023 03/17/23 06/12/23  Zigmund Daniel., MD  sucralfate (CARAFATE) 1 g tablet Take 1 tablet (1 g total) by mouth 4 (four) times daily -  with meals and at bedtime. Patient not taking: Reported on 05/02/2023 02/11/23   Horton, Mayer Masker, MD  tiZANidine (ZANAFLEX) 2 MG tablet Take 2 mg by mouth at bedtime. Patient not taking: Reported on 05/02/2023 02/19/23   [provider]  zonisamide (ZONEGRAN) 100 MG capsule Take 2 capsules (200 mg total) by mouth daily. Patient not taking: Reported on 06/12/2023 01/12/23   Lewie Chamber, MD      Allergies    Iron dextran, Aspirin, and Penicillins    Review of Systems   Review of  Systems A 10 point review of systems was performed and is negative unless otherwise reported in HPI.  Physical Exam Updated Vital Signs BP 130/70   Pulse (!) 121   Resp (!) 33   Ht 5\' 2"  (1.575 m)   LMP 01/08/2011   SpO2 98%   BMI 21.03 kg/m  Physical Exam General: Normal appearing female, lying in bed.  HEENT: Sclera anicteric, MMM, trachea midline.  Cardiology: Mild regular tachycardia on exam, no murmurs/rubs/gallops.  Resp: Normal respiratory rate and effort. No rhonchi, crackles. Mild expiratory wheezing bilaterally. Abd: Soft, non-tender, non-distended. No rebound tenderness or guarding.  GU: Deferred. MSK: No peripheral edema or signs of  trauma. Extremities without deformity or TTP. No cyanosis or clubbing. Skin: warm, dry.  Back: No CVA tenderness Neuro: A&Ox4, CNs II-XII grossly intact. MAEs. Sensation grossly intact.  Psych: Normal mood and affect.   ED Results / Procedures / Treatments   Labs (all labs ordered are listed, but only abnormal results are displayed) Labs Reviewed  CBC WITH DIFFERENTIAL/PLATELET - Abnormal; Notable for the following components:      Result Value   RBC 3.81 (*)    Hemoglobin 7.3 (*)    HCT 25.7 (*)    MCV 67.5 (*)    MCH 19.2 (*)    MCHC 28.4 (*)    RDW 19.9 (*)    All other components within normal limits  BASIC METABOLIC PANEL - Abnormal; Notable for the following components:   Glucose, Bld 190 (*)    BUN <5 (*)    Calcium 8.2 (*)    All other components within normal limits    EKG EKG Interpretation Date/Time:  Wednesday July 15 2023 19:11:36 EST Ventricular Rate:  123 PR Interval:  127 QRS Duration:  85 QT Interval:  304 QTC Calculation: 435 R Axis:   28  Text Interpretation: Sinus tachycardia Left atrial enlargement RSR' in V1 or V2, probably normal variant Confirmed by Vivi Barrack [phone]) on 07/15/2023 11:00:17 PM  Radiology DG Chest Port 1 View Result Date: 07/15/2023 CLINICAL DATA:  Shortness of breath,  vomiting, wheezing EXAM: PORTABLE CHEST 1 VIEW COMPARISON:  07/07/2023 FINDINGS: Heart and mediastinal contours within normal limits. Interstitial prominence throughout the lungs, most pronounced in the lower lung zones. Questionable patchy opacities in the right mid and lower lung. No effusions. No acute bony abnormality. IMPRESSION: Mild interstitial prominence with patchy opacities suspected in the right mid and lower lung. Findings may reflect pneumonia including atypical pneumonia. Electronically Signed   By: Charlett Nose M.D.   On: 07/15/2023 01:14    Procedures Procedures    Medications Ordered in ED Medications  ketorolac (TORADOL) 15 MG/ML injection 15 mg (15 mg Intravenous Given 07/15/23 2158)  acetaminophen (TYLENOL) tablet 1,000 mg (1,000 mg Oral Given 07/15/23 2113)  lactated ringers bolus 1,000 mL (0 mLs Intravenous Stopped 07/15/23 2317)  azithromycin (ZITHROMAX) tablet 250 mg (250 mg Oral Given 07/16/23 0053)  cefTRIAXone (ROCEPHIN) 1 g in sodium chloride 0.9 % 100 mL IVPB (0 g Intravenous Stopped 07/16/23 0126)  LORazepam (ATIVAN) tablet 1 mg (1 mg Oral Given 07/16/23 0053)  ondansetron (ZOFRAN) injection 4 mg (4 mg Intravenous Given 07/16/23 0236)  predniSONE (DELTASONE) tablet 40 mg (40 mg Oral Given 07/16/23 0236)  albuterol (VENTOLIN HFA) 108 (90 Base) MCG/ACT inhaler 2 puff (2 puffs Inhalation Given 07/16/23 0450)    ED Course/ Medical Decision Making/ A&P                          Medical Decision Making Amount and/or Complexity of Data Reviewed Labs: ordered. Radiology: ordered.  Risk OTC drugs. Prescription drug management.    This patient presents to the ED for concern of ?SOB, body aches, , this involves an extensive number of treatment options, and is a complaint that carries with it a high risk of complications and morbidity.  I considered the following differential and admission for this acute, potentially life threatening condition.   MDM:    Patient has h/o  COPD who reportedly called EMS for shortness of breath.  She states now her breathing feels fine after COPD treatment.  She does still have mild expiratory wheezing bilaterally.  Was also diagnosed with pneumonia yesterday and she reports mildly tachycardic. This could be just due to the breathing treatments he received but also possible that she could have worsening pneumonia and possible SIRS.  Will treat her further for possible COPD exacerbation, her body aches w/ tyelonol/toradol, PNA w/ her abx, and obtain labs and give fluids. Overall she is in no acute distress.      Labs: I Ordered, and personally interpreted labs.  The pertinent results include:  those listed above  Imaging Studies ordered: I ordered imaging studies including CXR I independently visualized and interpreted imaging. I agree with the radiologist interpretation  Additional history obtained from chart review, EMS.    Cardiac Monitoring: The patient was maintained on a cardiac monitor.  I personally viewed and interpreted the cardiac monitored which showed an underlying rhythm of: sinus tachycardia  Reevaluation: After the interventions noted above, I reevaluated the patient and found that they have :improved  Social Determinants of Health: Lives independently  Disposition:  Patient is signed out to the oncoming ED physician Dr. Madilyn Hook who is made aware of her history, presentation, exam, workup, and plan.    Co morbidities that complicate the patient evaluation  Past Medical History:  Diagnosis Date   Abdominal pain    Accidental drug overdose April 2013   Anxiety    Atrial fibrillation (HCC) 09/29/11   converted spontaneously   Chronic back pain    Chronic knee pain    Chronic nausea    Chronic pain    COPD (chronic obstructive pulmonary disease) (HCC)    Depression    Diabetes mellitus    states her doctor took her off all DM meds in past month   Diabetic neuropathy (HCC)    Dyspnea    with exertion     GERD (gastroesophageal reflux disease)    Headache(784.0)    migraines    HTN (hypertension)    not on meds since in a year    Hyperlipidemia    Hypothyroidism    not on meds in a while    Mental disorder    Bipolar and schizophrenic   Nausea and vomiting 01/02/2023   Requires supplemental oxygen    as needed per patient    Schizophrenia (HCC)    Schizophrenia, acute (HCC) 11/13/2017   Tobacco abuse      Medicines No orders of the defined types were placed in this encounter.   I have reviewed the patients home medicines and have made adjustments as needed  Problem List / ED Course: Problem List Items Addressed This Visit   None               This note was created using dictation software, which may contain spelling or grammatical errors.    Loetta Rough, MD 07/23/23 [phone]

## 2023-07-15 NOTE — ED Triage Notes (Signed)
Pt brought in by EMS for vomiting and SOB/wheezing starting today. Pt poor historian. Pt reports body aches and chills. Breathing treatment given en route to hospital. Improved breath sounds per Ems.

## 2023-07-15 NOTE — ED Triage Notes (Signed)
Pt here today for PNA. Hx of COPD. Pt states breathing got worse after smoking. Fire gave 5mg  of albuterol. 1mg  of Atrovent. EMS  gave 10 mg of Atrovent.

## 2023-07-15 NOTE — ED Notes (Signed)
Pt ambulated to bathroom 

## 2023-07-15 NOTE — ED Provider Notes (Signed)
[facility] AT [facility] Provider Note   CSN: [id number] Arrival date & time: 07/15/23  0019     History  Chief Complaint  Patient presents with   Shortness of Breath    [name] is a 58 y.o. female.  Resents to the emergency department by ambulance for shortness of breath.  She reports that she has had cough and shortness of breath all day.  Patient given a breathing treatment by EMS with improvement.       Home Medications Prior to Admission medications   Medication Sig Start Date End Date Taking? Authorizing Provider  azithromycin (ZITHROMAX) 250 MG tablet Take 1 tablet (250 mg total) by mouth daily. 07/15/23  Yes Leor Whyte, Canary Brim, MD  cefdinir (OMNICEF) 300 MG capsule Take 1 capsule (300 mg total) by mouth 2 (two) times daily. 07/15/23  Yes Inette Doubrava, Canary Brim, MD  albuterol (VENTOLIN HFA) 108 (90 Base) MCG/ACT inhaler Inhale 1-2 puffs into the lungs every 6 (six) hours as needed for wheezing or shortness of breath. 07/15/23   Arthuro Canelo, Canary Brim, MD  ARIPiprazole (ABILIFY) 10 MG tablet Take 1 tablet (10 mg total) by mouth daily. Patient not taking: Reported on 05/02/2023 05/02/23   Sarina Ill, DO  budesonide-formoterol [facility]) 160-4.5 MCG/ACT inhaler Inhale 2 puffs into the lungs 2 (two) times daily. 02/25/23   Sabas Sous, MD  cyanocobalamin (VITAMIN B12) 1000 MCG tablet Take 1 tablet (1,000 mcg total) by mouth daily. Patient not taking: Reported on 06/12/2023 03/16/23 07/24/23  Zigmund Daniel., MD  divalproex (DEPAKOTE) 500 MG DR tablet Take 1 tablet (500 mg total) by mouth every 12 (twelve) hours. 07/07/23   Lonell Grandchild, MD  famotidine (PEPCID) 20 MG tablet Take 1 tablet (20 mg total) by mouth daily. Patient not taking: Reported on 05/02/2023 02/25/23   Sabas Sous, MD  ferrous sulfate 325 (65 FE) MG tablet Take 1 tablet (325 mg total) by mouth every other day. Patient not taking: Reported on  05/02/2023 03/18/23 10/02/23  Zigmund Daniel., MD  fluticasone-salmeterol (ADVAIR) 250-50 MCG/ACT AEPB Inhale 1 puff into the lungs in the morning and at bedtime.    [provider]  furosemide (LASIX) 20 MG tablet Take 1 tablet (20 mg total) by mouth daily. Patient not taking: Reported on 05/02/2023 02/24/23   Achille Rich, PA-C  gabapentin (NEURONTIN) 300 MG capsule Take 1 capsule (300 mg total) by mouth 3 (three) times daily. Patient not taking: Reported on 05/02/2023 05/01/23   Sarina Ill, DO  hyoscyamine (LEVSIN/SL) 0.125 MG SL tablet Place 1 tablet (0.125 mg total) under the tongue every 4 (four) hours as needed (pain). Up to 1.25 mg daily Patient not taking: Reported on 05/02/2023 01/23/23   Arthor Captain, PA-C  ipratropium-albuterol (DUONEB) 0.5-2.5 (3) MG/3ML SOLN Take 3 mLs by nebulization every 6 (six) hours as needed (for shortness of breath or wheezing).    [provider]  levETIRAcetam (KEPPRA XR) 500 MG 24 hr tablet Take 1 tablet (500 mg total) by mouth 2 (two) times daily. 07/07/23   Lonell Grandchild, MD  metFORMIN (GLUCOPHAGE-XR) 750 MG 24 hr tablet Take 1 tablet (750 mg total) by mouth daily. 06/17/23 07/17/23  Earney Navy, NP  nicotine (NICODERM CQ - DOSED IN MG/24 HOURS) 21 mg/24hr patch Place 1 patch (21 mg total) onto the skin daily. 06/16/23   Earney Navy, NP  nicotine polacrilex (NICORETTE) 2 MG gum Take 1  each (2 mg total) by mouth as needed for smoking cessation. 06/16/23   Earney Navy, NP  ondansetron (ZOFRAN) 4 MG tablet Take 1 tablet (4 mg total) by mouth every 6 (six) hours. Patient not taking: Reported on 05/02/2023 01/20/23   Prosperi, Christian H, PA-C  ondansetron (ZOFRAN-ODT) 4 MG disintegrating tablet Take 1 tablet (4 mg total) by mouth every 8 (eight) hours as needed for nausea or vomiting. Patient not taking: Reported on 05/02/2023 03/01/23   Laurence Spates, MD  OXYGEN Inhale 4-5 L/min into the lungs as  needed (for COPD-related symptoms).    [provider]  pantoprazole (PROTONIX) 40 MG tablet Take 1 tablet (40 mg total) by mouth daily. Patient not taking: Reported on 06/12/2023 03/17/23 06/12/23  Zigmund Daniel., MD  sucralfate (CARAFATE) 1 g tablet Take 1 tablet (1 g total) by mouth 4 (four) times daily -  with meals and at bedtime. Patient not taking: Reported on 05/02/2023 02/11/23   Horton, Mayer Masker, MD  tiZANidine (ZANAFLEX) 2 MG tablet Take 2 mg by mouth at bedtime. Patient not taking: Reported on 05/02/2023 02/19/23   [provider]  zonisamide (ZONEGRAN) 100 MG capsule Take 2 capsules (200 mg total) by mouth daily. Patient not taking: Reported on 06/12/2023 01/12/23   Lewie Chamber, MD      Allergies    Iron dextran, Aspirin, and Penicillins    Review of Systems   Review of Systems  Physical Exam Updated Vital Signs BP 124/71   Pulse (!) 103   Temp 97.9 F (36.6 C) (Oral)   Resp 18   Ht 5\' 2"  (1.575 m)   Wt 52.2 kg   LMP 01/08/2011   SpO2 100%   BMI 21.03 kg/m  Physical Exam Vitals and nursing note reviewed.  Constitutional:      General: She is not in acute distress.    Appearance: She is well-developed.  HENT:     Head: Normocephalic and atraumatic.     Mouth/Throat:     Mouth: Mucous membranes are moist.  Eyes:     General: Vision grossly intact. Gaze aligned appropriately.     Extraocular Movements: Extraocular movements intact.     Conjunctiva/sclera: Conjunctivae normal.  Cardiovascular:     Rate and Rhythm: Normal rate and regular rhythm.     Pulses: Normal pulses.     Heart sounds: Normal heart sounds, S1 normal and S2 normal. No murmur heard.    No friction rub. No gallop.  Pulmonary:     Effort: Pulmonary effort is normal. No respiratory distress.     Breath sounds: Rhonchi present.  Abdominal:     General: Bowel sounds are normal.     Palpations: Abdomen is soft.     Tenderness: There is no abdominal tenderness. There  is no guarding or rebound.     Hernia: No hernia is present.  Musculoskeletal:        General: No swelling.     Cervical back: Full passive range of motion without pain, normal range of motion and neck supple. No spinous process tenderness or muscular tenderness. Normal range of motion.     Right lower leg: No edema.     Left lower leg: No edema.  Skin:    General: Skin is warm and dry.     Capillary Refill: Capillary refill takes less than 2 seconds.     Findings: No ecchymosis, erythema, rash or wound.  Neurological:     General: No  focal deficit present.     Mental Status: She is alert and oriented to person, place, and time.     GCS: GCS eye subscore is 4. GCS verbal subscore is 5. GCS motor subscore is 6.     Cranial Nerves: Cranial nerves 2-12 are intact.     Sensory: Sensation is intact.     Motor: Motor function is intact.     Coordination: Coordination is intact.  Psychiatric:        Attention and Perception: Attention normal.        Mood and Affect: Mood normal.        Speech: Speech normal.        Behavior: Behavior normal.     ED Results / Procedures / Treatments   Labs (all labs ordered are listed, but only abnormal results are displayed) Labs Reviewed  CBC - Abnormal; Notable for the following components:      Result Value   Hemoglobin 8.0 (*)    HCT 28.6 (*)    MCV 67.9 (*)    MCH 19.0 (*)    MCHC 28.0 (*)    RDW 20.1 (*)    All other components within normal limits  BASIC METABOLIC PANEL - Abnormal; Notable for the following components:   CO2 21 (*)    Glucose, Bld 154 (*)    Calcium 8.3 (*)    All other components within normal limits  RESP PANEL BY RT-PCR (RSV, FLU A&B, COVID)  [id number]    EKG None  Radiology DG Chest Port 1 View Result Date: 07/15/2023 CLINICAL DATA:  Shortness of breath, vomiting, wheezing EXAM: PORTABLE CHEST 1 VIEW COMPARISON:  07/07/2023 FINDINGS: Heart and mediastinal contours within normal limits. Interstitial prominence  throughout the lungs, most pronounced in the lower lung zones. Questionable patchy opacities in the right mid and lower lung. No effusions. No acute bony abnormality. IMPRESSION: Mild interstitial prominence with patchy opacities suspected in the right mid and lower lung. Findings may reflect pneumonia including atypical pneumonia. Electronically Signed   By: Charlett Nose M.D.   On: 07/15/2023 01:14    Procedures Procedures    Medications Ordered in ED Medications  cefTRIAXone (ROCEPHIN) 1 g in sodium chloride 0.9 % 100 mL IVPB (has no administration in time range)  azithromycin (ZITHROMAX) 500 mg in sodium chloride 0.9 % 250 mL IVPB (has no administration in time range)  acetaminophen (TYLENOL) tablet 650 mg (650 mg Oral Given 07/15/23 0325)    ED Course/ Medical Decision Making/ A&P                                 Medical Decision Making Amount and/or Complexity of Data Reviewed Labs: ordered. Radiology: ordered.  Risk OTC drugs.   Differential Diagnosis considered includes, but not limited to: COPD exacerbation; Bronchitis; Pneumonia; CHF; ACS; PE  Patient brought to the emergency department by ambulance for shortness of breath.  Patient complaining of cough, started earlier today.  She reports that her daughter currently is being treated for pneumonia.  Patient does have a history of COPD.  At arrival to the ED, patient is breathing comfortably.  No active wheezing, some scattered rhonchi.  She is not requiring any supplemental oxygen.  She is not in respiratory distress.  Patient is afebrile, blood pressure is normal.  She does have mild tachycardia, likely associated with nebulizer treatment.  Oxygen saturations are 100%.  Basic labs are at  her normal baseline.  Slight leukocytosis.  Chest x-ray suspicious for pneumonia.  Patient administered Rocephin and Zithromax, appears comfortable and will be appropriate for continued outpatient management with antibiotics and treatment  for COPD.        Final Clinical Impression(s) / ED Diagnoses Final diagnoses:  COPD exacerbation (HCC)  Community acquired pneumonia of right middle lobe of lung    Rx / DC Orders ED Discharge Orders          Ordered    albuterol (VENTOLIN HFA) 108 (90 Base) MCG/ACT inhaler  Every 6 hours PRN        07/15/23 0338    cefdinir (OMNICEF) 300 MG capsule  2 times daily        07/15/23 0338    azithromycin (ZITHROMAX) 250 MG tablet  Daily        07/15/23 0338              Gilda Crease, MD 07/15/23 [phone]

## 2023-07-16 ENCOUNTER — Other Ambulatory Visit (HOSPITAL_COMMUNITY): Payer: Self-pay

## 2023-07-16 ENCOUNTER — Emergency Department (HOSPITAL_COMMUNITY): Payer: 59

## 2023-07-16 DIAGNOSIS — J181 Lobar pneumonia, unspecified organism: Secondary | ICD-10-CM | POA: Diagnosis not present

## 2023-07-16 MED ORDER — ONDANSETRON 4 MG PO TBDP
4.0000 mg | ORAL_TABLET | Freq: Three times a day (TID) | ORAL | 0 refills | Status: DC | PRN
  Filled 2023-07-16: qty 12, 4d supply, fill #0

## 2023-07-16 MED ORDER — AZITHROMYCIN 250 MG PO TABS
250.0000 mg | ORAL_TABLET | Freq: Every day | ORAL | 0 refills | Status: DC
  Filled 2023-07-16: qty 3, 3d supply, fill #0

## 2023-07-16 MED ORDER — LORAZEPAM 1 MG PO TABS
1.0000 mg | ORAL_TABLET | Freq: Once | ORAL | Status: AC
  Administered 2023-07-16: 1 mg via ORAL
  Filled 2023-07-16: qty 1

## 2023-07-16 MED ORDER — SODIUM CHLORIDE 0.9 % IV SOLN
1.0000 g | Freq: Once | INTRAVENOUS | Status: AC
  Administered 2023-07-16: 1 g via INTRAVENOUS
  Filled 2023-07-16: qty 10

## 2023-07-16 MED ORDER — ALBUTEROL SULFATE HFA 108 (90 BASE) MCG/ACT IN AERS
2.0000 | INHALATION_SPRAY | Freq: Once | RESPIRATORY_TRACT | Status: AC
  Administered 2023-07-16: 2 via RESPIRATORY_TRACT
  Filled 2023-07-16: qty 6.7

## 2023-07-16 MED ORDER — ONDANSETRON HCL 4 MG/2ML IJ SOLN
4.0000 mg | Freq: Once | INTRAMUSCULAR | Status: AC
  Administered 2023-07-16: 4 mg via INTRAVENOUS
  Filled 2023-07-16: qty 2

## 2023-07-16 MED ORDER — CEFDINIR 300 MG PO CAPS
300.0000 mg | ORAL_CAPSULE | Freq: Two times a day (BID) | ORAL | 0 refills | Status: DC
  Filled 2023-07-16: qty 10, 5d supply, fill #0

## 2023-07-16 MED ORDER — PREDNISONE 20 MG PO TABS
40.0000 mg | ORAL_TABLET | Freq: Once | ORAL | Status: AC
  Administered 2023-07-16: 40 mg via ORAL
  Filled 2023-07-16: qty 2

## 2023-07-16 MED ORDER — PREDNISONE 10 MG PO TABS
40.0000 mg | ORAL_TABLET | Freq: Every day | ORAL | 0 refills | Status: DC
  Filled 2023-07-16: qty 12, 3d supply, fill #0

## 2023-07-16 MED ORDER — AZITHROMYCIN 250 MG PO TABS
250.0000 mg | ORAL_TABLET | Freq: Once | ORAL | Status: AC
  Administered 2023-07-16: 250 mg via ORAL
  Filled 2023-07-16: qty 1

## 2023-07-16 NOTE — ED Provider Notes (Signed)
Care assumed at 2330 pending reassessment.  Patient diagnosed with pneumonia yesterday, has a history of COPD.  Care assumed pending reevaluation after fluids, neb.  Patient states that she continues to smoke cigarettes, drinks a 40 ounce beer daily and also smokes "rock".  Last use rock a few days ago but would use it every day if she could afford it.  She states she does not want to be in the hospital.  She is having difficulty getting her medications due to transportation and cost.  She currently lives with her brother and does have a safe and warm home to stay at.  She would prefer discharge home if this is a possibility.  On assessment she is tachycardic, mildly tremulous.  She has good air movement bilaterally with normal work of breathing.  There are occasional end expiratory wheezes.  She has received IV fluids, acetaminophen for her body aches.  Will provide a neb, today's dose of antibiotics, steroids.  TOC consult will be placed.  Also suspect there is a small element of withdrawal as well, will treat with lorazepam.  Patient does not want to stop drinking at this point.  On reassessment patient continues to feel improved, feel she is stable for discharge home.  She is having difficulty with her medications-TOC consult placed regarding medication assistance and repeat refills were sent to pharmacy.  Feel she is stable for discharge with return precautions for progressive or new concerning symptoms.  Also discussed smoking, alcohol and crack cocaine cessation.   Tilden Fossa, MD 07/16/23 [phone]

## 2023-07-16 NOTE — ED Notes (Signed)
Pt ambulated to the bathroom.  

## 2023-09-15 ENCOUNTER — Ambulatory Visit (INDEPENDENT_AMBULATORY_CARE_PROVIDER_SITE_OTHER): Payer: 59 | Admitting: Neurology

## 2023-09-15 ENCOUNTER — Encounter: Payer: Self-pay | Admitting: Neurology

## 2023-09-15 VITALS — BP 123/78 | HR 116 | Ht 62.0 in | Wt 121.0 lb

## 2023-09-15 DIAGNOSIS — G40909 Epilepsy, unspecified, not intractable, without status epilepticus: Secondary | ICD-10-CM | POA: Diagnosis not present

## 2023-09-15 DIAGNOSIS — F1010 Alcohol abuse, uncomplicated: Secondary | ICD-10-CM

## 2023-09-15 DIAGNOSIS — R413 Other amnesia: Secondary | ICD-10-CM | POA: Diagnosis not present

## 2023-09-15 NOTE — Patient Instructions (Addendum)
 Will contact pharmacy to verify current medications, also asked daughter to call us back with patient current list of medications Referral to neuropsychiatry for cognitive assessment and comment on capacity  Set up care with PCP  Return in 6 months
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
## 2023-09-15 NOTE — Progress Notes (Signed)
 [name]

## 2023-09-16 ENCOUNTER — Telehealth: Payer: Self-pay | Admitting: Neurology

## 2023-09-16 NOTE — Telephone Encounter (Signed)
 Referral for neuropsychology fax to [facility]. [phone], Fax: [phone]

## 2023-09-23 ENCOUNTER — Encounter (HOSPITAL_COMMUNITY): Payer: Self-pay

## 2023-09-23 ENCOUNTER — Emergency Department (HOSPITAL_COMMUNITY)

## 2023-09-23 ENCOUNTER — Other Ambulatory Visit: Payer: Self-pay

## 2023-09-23 ENCOUNTER — Emergency Department (HOSPITAL_COMMUNITY)
Admission: EM | Admit: 2023-09-23 | Discharge: 2023-09-23 | Disposition: A | Discharge status: Home / Self Care | Attending: Emergency Medicine | Admitting: Emergency Medicine

## 2023-09-23 DIAGNOSIS — R569 Unspecified convulsions: Secondary | ICD-10-CM | POA: Insufficient documentation

## 2023-09-23 DIAGNOSIS — R059 Cough, unspecified: Secondary | ICD-10-CM | POA: Insufficient documentation

## 2023-09-23 DIAGNOSIS — Z91148 Patient's other noncompliance with medication regimen for other reason: Secondary | ICD-10-CM | POA: Insufficient documentation

## 2023-09-23 DIAGNOSIS — R053 Chronic cough: Secondary | ICD-10-CM | POA: Diagnosis not present

## 2023-09-23 DIAGNOSIS — R109 Unspecified abdominal pain: Secondary | ICD-10-CM | POA: Diagnosis not present

## 2023-09-23 LAB — RAPID URINE DRUG SCREEN, HOSP PERFORMED
Amphetamines: NOT DETECTED
Barbiturates: NOT DETECTED
Benzodiazepines: NOT DETECTED
Cocaine: NOT DETECTED
Opiates: NOT DETECTED
Tetrahydrocannabinol: NOT DETECTED

## 2023-09-23 LAB — CBC WITH DIFFERENTIAL/PLATELET
Abs Immature Granulocytes: 0.02 10*3/uL (ref 0.00–0.07)
Basophils Absolute: 0 10*3/uL (ref 0.0–0.1)
Basophils Relative: 0 %
Eosinophils Absolute: 0 10*3/uL (ref 0.0–0.5)
Eosinophils Relative: 0 %
HCT: 28.6 % — ABNORMAL LOW (ref 36.0–46.0)
Hemoglobin: 8.1 g/dL — ABNORMAL LOW (ref 12.0–15.0)
Immature Granulocytes: 0 %
Lymphocytes Relative: 13 %
Lymphs Abs: 1 10*3/uL (ref 0.7–4.0)
MCH: 19.6 pg — ABNORMAL LOW (ref 26.0–34.0)
MCHC: 28.3 g/dL — ABNORMAL LOW (ref 30.0–36.0)
MCV: 69.2 fL — ABNORMAL LOW (ref 80.0–100.0)
Monocytes Absolute: 0.5 10*3/uL (ref 0.1–1.0)
Monocytes Relative: 6 %
Neutro Abs: 6.4 10*3/uL (ref 1.7–7.7)
Neutrophils Relative %: 81 %
Platelets: 268 10*3/uL (ref 150–400)
RBC: 4.13 MIL/uL (ref 3.87–5.11)
RDW: 21.3 % — ABNORMAL HIGH (ref 11.5–15.5)
WBC: 8 10*3/uL (ref 4.0–10.5)
nRBC: 0 % (ref 0.0–0.2)

## 2023-09-23 LAB — URINALYSIS, W/ REFLEX TO CULTURE (INFECTION SUSPECTED)
Bacteria, UA: NONE SEEN
Bilirubin Urine: NEGATIVE
Glucose, UA: NEGATIVE mg/dL
Hgb urine dipstick: NEGATIVE
Ketones, ur: NEGATIVE mg/dL
Leukocytes,Ua: NEGATIVE
Nitrite: NEGATIVE
Protein, ur: NEGATIVE mg/dL
Specific Gravity, Urine: 1.001 — ABNORMAL LOW (ref 1.005–1.030)
pH: 7 (ref 5.0–8.0)

## 2023-09-23 LAB — COMPREHENSIVE METABOLIC PANEL WITH GFR
ALT: 16 U/L (ref 0–44)
AST: 29 U/L (ref 15–41)
Albumin: 3.6 g/dL (ref 3.5–5.0)
Alkaline Phosphatase: 103 U/L (ref 38–126)
Anion gap: 11 (ref 5–15)
BUN: <5 mg/dL — ABNORMAL LOW (ref 6–20)
CO2: 24 mmol/L (ref 22–32)
Calcium: 9 mg/dL (ref 8.9–10.3)
Chloride: 99 mmol/L (ref 98–111)
Creatinine, Ser: 0.58 mg/dL (ref 0.44–1.00)
GFR, Estimated: >60 mL/min (ref >60)
Glucose, Bld: 134 mg/dL — ABNORMAL HIGH (ref 70–99)
Potassium: 4.3 mmol/L (ref 3.5–5.1)
Sodium: 134 mmol/L — ABNORMAL LOW (ref 135–145)
Total Bilirubin: 0.7 mg/dL (ref 0.0–1.2)
Total Protein: 7.6 g/dL (ref 6.5–8.1)

## 2023-09-23 LAB — MAGNESIUM: Magnesium: 2 mg/dL (ref 1.7–2.4)

## 2023-09-23 LAB — RESP PANEL BY RT-PCR (RSV, FLU A&B, COVID)  RVPGX2
Influenza A by PCR: NEGATIVE
Influenza B by PCR: NEGATIVE
Resp Syncytial Virus by PCR: NEGATIVE
SARS Coronavirus 2 by RT PCR: NEGATIVE

## 2023-09-23 LAB — SALICYLATE LEVEL: Salicylate Lvl: <7 mg/dL — ABNORMAL LOW (ref 7.0–30.0)

## 2023-09-23 LAB — ETHANOL: Alcohol, Ethyl (B): <10 mg/dL (ref <10)

## 2023-09-23 LAB — LIPASE, BLOOD: Lipase: 31 U/L (ref 11–51)

## 2023-09-23 LAB — VALPROIC ACID LEVEL: Valproic Acid Lvl: <10 ug/mL — ABNORMAL LOW (ref 50.0–100.0)

## 2023-09-23 LAB — ACETAMINOPHEN LEVEL: Acetaminophen (Tylenol), Serum: <10 ug/mL — ABNORMAL LOW (ref 10–30)

## 2023-09-23 MED ORDER — LEVETIRACETAM IN NACL 1500 MG/100ML IV SOLN
1500.0000 mg | Freq: Once | INTRAVENOUS | Status: AC
  Administered 2023-09-23: 1500 mg via INTRAVENOUS
  Filled 2023-09-23: qty 100

## 2023-09-23 MED ORDER — IOHEXOL 350 MG/ML SOLN
75.0000 mL | Freq: Once | INTRAVENOUS | Status: AC | PRN
  Administered 2023-09-23: 75 mL via INTRAVENOUS

## 2023-09-23 MED ORDER — SODIUM CHLORIDE 0.9 % IV BOLUS
1000.0000 mL | Freq: Once | INTRAVENOUS | Status: AC
  Administered 2023-09-23: 1000 mL via INTRAVENOUS

## 2023-09-23 MED ORDER — ONDANSETRON HCL 4 MG/2ML IJ SOLN
4.0000 mg | Freq: Once | INTRAMUSCULAR | Status: AC
  Administered 2023-09-23: 4 mg via INTRAVENOUS
  Filled 2023-09-23: qty 2
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
## 2023-09-23 NOTE — Discharge Instructions (Signed)
 Make sure to take your seizure medicine  Follow-up outpatient  Turn for any worsening symptoms

## 2023-09-23 NOTE — ED Provider Notes (Signed)
 [facility] Provider Note   CSN: [id number] Arrival date & time: 09/23/23  1745    History  Chief Complaint  Patient presents with   Alcohol Intoxication   Seizures    [name] is a 58 y.o. female well-known to the emergency department with 31 visits in the last 6 months with multiple medical comorbidities here for evaluation of emesis and seizure-like activity.  She came from her boyfriend's house.  Significant other told EMS she had been drinking all day and had a seizure.  Patient does not remember having a seizure.  When EMS arrived she was not postictal.  Patient states she woke up this morning, ate breakfast and started to vomit.  She is unsure the last time she took her seizure medication.  She states her daughter helps her with her medications.  3 bowel movements yesterday which she does not know if they are diarrhea.  She denies any blood in her emesis or stool.  She is unsure if she had a fall to the ground.  She states she had "3 small beers this morning." Nothing since.  Left 5 caveat-poor historian   HPI     Home Medications Prior to Admission medications   Medication Sig Start Date End Date Taking? Authorizing Provider  albuterol (VENTOLIN HFA) 108 (90 Base) MCG/ACT inhaler Inhale 1-2 puffs into the lungs every 6 (six) hours as needed for wheezing or shortness of breath. Patient not taking: Reported on 09/15/2023 07/15/23   Gilda Crease, MD  budesonide-formoterol [facility]) 160-4.5 MCG/ACT inhaler Inhale 2 puffs into the lungs 2 (two) times daily. Patient not taking: Reported on 09/15/2023 02/25/23   Sabas Sous, MD  cefdinir (OMNICEF) 300 MG capsule Take 1 capsule (300 mg total) by mouth 2 (two) times daily. Patient not taking: Reported on 09/15/2023 07/16/23   Tilden Fossa, MD  famotidine (PEPCID) 20 MG tablet Take 1 tablet (20 mg total) by mouth daily. Patient not taking: Reported on 05/02/2023 02/25/23    Sabas Sous, MD  ferrous sulfate 325 (65 FE) MG tablet Take 1 tablet (325 mg total) by mouth every other day. Patient not taking: Reported on 05/02/2023 03/18/23 10/02/23  Zigmund Daniel., MD  fluticasone-salmeterol (ADVAIR) 250-50 MCG/ACT AEPB Inhale 1 puff into the lungs in the morning and at bedtime. Patient not taking: Reported on 09/15/2023    [provider]  furosemide (LASIX) 20 MG tablet Take 1 tablet (20 mg total) by mouth daily. Patient not taking: Reported on 05/02/2023 02/24/23   Achille Rich, PA-C  gabapentin (NEURONTIN) 300 MG capsule Take 1 capsule (300 mg total) by mouth 3 (three) times daily. 05/01/23   Sarina Ill, DO  hyoscyamine (LEVSIN/SL) 0.125 MG SL tablet Place 1 tablet (0.125 mg total) under the tongue every 4 (four) hours as needed (pain). Up to 1.25 mg daily Patient not taking: Reported on 05/02/2023 01/23/23   Arthor Captain, PA-C  ipratropium-albuterol (DUONEB) 0.5-2.5 (3) MG/3ML SOLN Take 3 mLs by nebulization every 6 (six) hours as needed (for shortness of breath or wheezing). Patient not taking: Reported on 09/15/2023    [provider]  levETIRAcetam (KEPPRA XR) 500 MG 24 hr tablet Take 1 tablet (500 mg total) by mouth 2 (two) times daily. Patient not taking: Reported on 09/15/2023 07/07/23   Lonell Grandchild, MD  metFORMIN (GLUCOPHAGE-XR) 750 MG 24 hr tablet Take 1 tablet (750 mg total) by mouth daily. 06/17/23 07/17/23  Welford Roche,  Hessie Dibble, NP  nicotine (NICODERM CQ - DOSED IN MG/24 HOURS) 21 mg/24hr patch Place 1 patch (21 mg total) onto the skin daily. Patient not taking: Reported on 09/15/2023 06/16/23   Earney Navy, NP  nicotine polacrilex (NICORETTE) 2 MG gum Take 1 each (2 mg total) by mouth as needed for smoking cessation. Patient not taking: Reported on 09/15/2023 06/16/23   Earney Navy, NP  ondansetron (ZOFRAN) 4 MG tablet Take 1 tablet (4 mg total) by mouth every 6 (six) hours. Patient not taking: Reported  on 05/02/2023 01/20/23   Prosperi, Christian H, PA-C  ondansetron (ZOFRAN-ODT) 4 MG disintegrating tablet Take 1 tablet (4 mg total) by mouth every 8 (eight) hours as needed. Patient not taking: Reported on 09/15/2023 07/16/23   Tilden Fossa, MD  OXYGEN Inhale 4-5 L/min into the lungs as needed (for COPD-related symptoms). Patient not taking: Reported on 09/15/2023    [provider]  pantoprazole (PROTONIX) 40 MG tablet Take 1 tablet (40 mg total) by mouth daily. Patient not taking: Reported on 06/12/2023 03/17/23 06/12/23  Zigmund Daniel., MD  predniSONE (DELTASONE) 10 MG tablet Take 4 tablets (40 mg total) by mouth daily. Patient not taking: Reported on 09/15/2023 07/16/23   Tilden Fossa, MD  sucralfate (CARAFATE) 1 g tablet Take 1 tablet (1 g total) by mouth 4 (four) times daily -  with meals and at bedtime. Patient not taking: Reported on 05/02/2023 02/11/23   Horton, Mayer Masker, MD  tiZANidine (ZANAFLEX) 2 MG tablet Take 2 mg by mouth at bedtime. Patient not taking: Reported on 05/02/2023 02/19/23   [provider]  zonisamide (ZONEGRAN) 100 MG capsule Take 2 capsules (200 mg total) by mouth daily. Patient not taking: Reported on 06/12/2023 01/12/23   Lewie Chamber, MD      Allergies    Iron dextran, Aspirin, and Penicillins    Review of Systems   Review of Systems  Unable to perform ROS: Other (poor historian)  Constitutional: Negative.   HENT: Negative.    Respiratory:  Positive for cough (chronic).   Gastrointestinal:  Positive for abdominal pain, nausea and vomiting. Negative for abdominal distention, blood in stool, constipation and diarrhea.  Genitourinary: Negative.   Musculoskeletal: Negative.   Skin: Negative.   Neurological:  Positive for seizures.  All other systems reviewed and are negative.   Physical Exam Updated Vital Signs BP 122/70 (BP Location: Left Arm)   Pulse 96   Temp 98.2 F (36.8 C) (Oral)   Resp 18   Ht 5\' 2"  (1.575 m)   Wt  59 kg   LMP 01/08/2011   SpO2 93%   BMI 23.78 kg/m  Physical Exam Vitals and nursing note reviewed.  Constitutional:      General: She is not in acute distress.    Appearance: She is well-developed. She is not ill-appearing or diaphoretic.     Comments: Actively vomiting  HENT:     Head: Normocephalic and atraumatic.     Nose: Nose normal.     Mouth/Throat:     Mouth: Mucous membranes are moist.  Eyes:     Pupils: Pupils are equal, round, and reactive to light.  Cardiovascular:     Rate and Rhythm: Normal rate and regular rhythm.     Pulses: Normal pulses.          Radial pulses are 2+ on the right side and 2+ on the left side.       Dorsalis pedis pulses are  2+ on the right side and 2+ on the left side.     Heart sounds: Normal heart sounds.  Pulmonary:     Effort: Pulmonary effort is normal. No respiratory distress.     Breath sounds: Normal breath sounds.  Abdominal:     General: Bowel sounds are normal. There is no distension.     Palpations: Abdomen is soft.     Tenderness: There is abdominal tenderness. There is no right CVA tenderness, left CVA tenderness, guarding or rebound.  Musculoskeletal:        General: No swelling, tenderness, deformity or signs of injury. Normal range of motion.     Cervical back: Normal range of motion and neck supple.  Skin:    General: Skin is warm and dry.     Capillary Refill: Capillary refill takes less than 2 seconds.  Neurological:     General: No focal deficit present.     Mental Status: She is alert.     Cranial Nerves: No cranial nerve deficit.     ED Results / Procedures / Treatments   Labs (all labs ordered are listed, but only abnormal results are displayed) Labs Reviewed  CBC WITH DIFFERENTIAL/PLATELET - Abnormal; Notable for the following components:      Result Value   Hemoglobin 8.1 (*)    HCT 28.6 (*)    MCV 69.2 (*)    MCH 19.6 (*)    MCHC 28.3 (*)    RDW 21.3 (*)    All other components within normal limits   COMPREHENSIVE METABOLIC PANEL WITH GFR - Abnormal; Notable for the following components:   Sodium 134 (*)    Glucose, Bld 134 (*)    BUN <5 (*)    All other components within normal limits  URINALYSIS, W/ REFLEX TO CULTURE (INFECTION SUSPECTED) - Abnormal; Notable for the following components:   Color, Urine COLORLESS (*)    Specific Gravity, Urine 1.001 (*)    All other components within normal limits  SALICYLATE LEVEL - Abnormal; Notable for the following components:   Salicylate Lvl <7.0 (*)    All other components within normal limits  ACETAMINOPHEN LEVEL - Abnormal; Notable for the following components:   Acetaminophen (Tylenol), Serum <10 (*)    All other components within normal limits  VALPROIC ACID LEVEL - Abnormal; Notable for the following components:   Valproic Acid Lvl <10 (*)    All other components within normal limits  RESP PANEL BY RT-PCR (RSV, FLU A&B, COVID)  RVPGX2  LIPASE, BLOOD  MAGNESIUM  ETHANOL  RAPID URINE DRUG SCREEN, HOSP PERFORMED    EKG None  Radiology CT Cervical Spine Wo Contrast Result Date: 09/23/2023 CLINICAL DATA:  Larey Seat EXAM: CT CERVICAL SPINE WITHOUT CONTRAST TECHNIQUE: Multidetector CT imaging of the cervical spine was performed without intravenous contrast. Multiplanar CT image reconstructions were also generated. RADIATION DOSE REDUCTION: This exam was performed according to the departmental dose-optimization program which includes automated exposure control, adjustment of the mA and/or kV according to patient size and/or use of iterative reconstruction technique. COMPARISON:  06/24/2023 FINDINGS: Alignment: Alignment is anatomic. Skull base and vertebrae: No acute fracture. No primary bone lesion or focal pathologic process. Soft tissues and spinal canal: No prevertebral fluid or swelling. No visible canal hematoma. Disc levels: Mild multilevel spondylosis, most pronounced at C4-5 and C5-6. Upper chest: Airway is patent.  Lung apices are clear.  Other: Reconstructed images demonstrate no additional findings. IMPRESSION: 1. No acute cervical spine fracture. 2. Stable  mild multilevel spondylosis. Electronically Signed   By: Sharlet Salina M.D.   On: 09/23/2023 21:29   CT HEAD WO CONTRAST ( ) Result Date: 09/23/2023 CLINICAL DATA:  Delirium, fell EXAM: CT HEAD WITHOUT CONTRAST TECHNIQUE: Contiguous axial images were obtained from the base of the skull through the vertex without intravenous contrast. RADIATION DOSE REDUCTION: This exam was performed according to the departmental dose-optimization program which includes automated exposure control, adjustment of the mA and/or kV according to patient size and/or use of iterative reconstruction technique. COMPARISON:  07/07/2023 FINDINGS: Brain: No acute infarct or hemorrhage. Lateral ventricles and midline structures are unremarkable. No acute extra-axial fluid collections. No mass effect. Vascular: No hyperdense vessel or unexpected calcification. Skull: Normal. Negative for fracture or focal lesion. Sinuses/Orbits: No acute finding. Other: None. IMPRESSION: 1. No acute intracranial process. Electronically Signed   By: Sharlet Salina M.D.   On: 09/23/2023 21:27   CT ABDOMEN PELVIS W CONTRAST Result Date: 09/23/2023 CLINICAL DATA:  Acute abdominal pain and diarrhea. EXAM: CT ABDOMEN AND PELVIS WITH CONTRAST TECHNIQUE: Multidetector CT imaging of the abdomen and pelvis was performed using the standard protocol following bolus administration of intravenous contrast. RADIATION DOSE REDUCTION: This exam was performed according to the departmental dose-optimization program which includes automated exposure control, adjustment of the mA and/or kV according to patient size and/or use of iterative reconstruction technique. CONTRAST:  75mL OMNIPAQUE IOHEXOL 350 MG/ML SOLN COMPARISON:  06/28/2023 FINDINGS: Lower Chest: No acute findings. Hepatobiliary: No suspicious hepatic masses identified. Mild diffuse hepatic  steatosis. Gallbladder is unremarkable. No evidence of biliary ductal dilatation. Pancreas:  No mass or inflammatory changes. Spleen: Within normal limits in size and appearance. Adrenals/Urinary Tract: No suspicious masses identified. No evidence of ureteral calculi or hydronephrosis. Stomach/Bowel: Previous gastric bypass surgery again noted. Small hiatal hernia is seen. No evidence of obstruction, inflammatory process or abnormal fluid collections. Vascular/Lymphatic: No pathologically enlarged lymph nodes. No acute vascular findings. Reproductive: Prior hysterectomy noted. Adnexal regions are unremarkable in appearance. Other:  None. Musculoskeletal: No suspicious bone lesions identified. Right hip prosthesis noted. Old T9 vertebral body compression fracture again noted. IMPRESSION: No acute findings within the abdomen or pelvis. Mild hepatic steatosis. Previous gastric bypass surgery.  Small hiatal hernia. Electronically Signed   By: Danae Orleans M.D.   On: 09/23/2023 21:20   DG Chest 2 View Result Date: 09/23/2023 CLINICAL DATA:  Seizure.  Vomiting.  Alcohol intoxication. EXAM: CHEST - 2 VIEW COMPARISON:  07/16/2023 FINDINGS: The heart size and mediastinal contours are within normal limits. Both lungs are clear. The visualized skeletal structures are unremarkable. Old mid and lower thoracic vertebral body compression fracture deformities are seen, as demonstrated on previous CTA. IMPRESSION: No active cardiopulmonary disease. Electronically Signed   By: Danae Orleans M.D.   On: 09/23/2023 19:56    Procedures Procedures    Medications Ordered in ED Medications  sodium chloride 0.9 % bolus 1,000 mL (0 mLs Intravenous Stopped 09/23/23 2056)  ondansetron (ZOFRAN) injection 4 mg (4 mg Intravenous Given 09/23/23 1848)  levETIRAcetam (KEPPRA) IVPB 1500 mg/ 100 mL premix (0 mg Intravenous Stopped 09/23/23 2057)  iohexol (OMNIPAQUE) 350 MG/ML injection 75 mL (75 mLs Intravenous Contrast Given 09/23/23 2055)     ED Course/ Medical Decision Making/ A&P   58 year old well-known to the emergency department 31 visits over the last 6 months with 1 admission multiple medical comorbidities here for evaluation of emesis and possible seizure.  She is known to be noncompliant with her medications.  Has  known history of seizures.  According to EMS picked her up from her significant other's house and they said she had been drinking EtOH and had a witnessed seizure.  Patient members waking up this morning eating breakfast with "3 small beers" and then vomiting.  She is no obvious traumatic injuries.  Unknown if she fell to the ground during her seizure.  She has some diffuse abdominal tenderness.  Actively vomiting.  Will plan on labs, imaging, Keppra load, IV fluids, antiemetics and reassess  Labs and imaging personally viewed and interpreted:  CBC without leukocytosis Metabolic panel sodium 134 Ethanol, salicylate, acetaminophen within normal limits Lipase 31 Mag 2.0 X-ray without significant abnormality UA neg for infection UDS neg  Patient reassessed.  Feels better after IV fluids and Zofran.  Pending CT scans.  CT scans without acute abnormality  Depakote level detectable, suspect medication noncompliance as likely cause of seizure-like activity.  No traumatic injury, she is tolerating p.o. intake. Ambulatory here  Will have her follow-up outpatient, return for any worsening symptoms.  The patient has been appropriately medically screened and/or stabilized in the ED. I have low suspicion for any other emergent medical condition which would require further screening, evaluation or treatment in the ED or require inpatient management.  Patient is hemodynamically stable and in no acute distress.  Patient able to ambulate in department prior to ED.  Evaluation does not show acute pathology that would require ongoing or additional emergent interventions while in the emergency department or further inpatient  treatment.  I have discussed the diagnosis with the patient and answered all questions.  Pain is been managed while in the emergency department and patient has no further complaints prior to discharge.  Patient is comfortable with plan discussed in room and is stable for discharge at this time.  I have discussed strict return precautions for returning to the emergency department.  Patient was encouraged to follow-up with PCP/specialist refer to at discharge.                                  Medical Decision Making Amount and/or Complexity of Data Reviewed Independent Historian: EMS External Data Reviewed: labs, radiology, ECG and notes. Labs: ordered. Decision-making details documented in ED Course. Radiology: ordered and independent interpretation performed. Decision-making details documented in ED Course. ECG/medicine tests: ordered and independent interpretation performed. Decision-making details documented in ED Course.  Risk OTC drugs. Prescription drug management. Parenteral controlled substances. Decision regarding hospitalization. Diagnosis or treatment significantly limited by social determinants of health.           Final Clinical Impression(s) / ED Diagnoses Final diagnoses:  Seizure-like activity (HCC)  Non compliance w medication regimen    Rx / DC Orders ED Discharge Orders     None         Lanier Millon A, PA-C 09/23/23 2335    Rozelle Logan, DO 09/24/23 0002

## 2023-09-28 ENCOUNTER — Emergency Department (HOSPITAL_COMMUNITY)
Admission: EM | Admit: 2023-09-28 | Discharge: 2023-09-28 | Disposition: A | Discharge status: Home / Self Care | Attending: Emergency Medicine | Admitting: Emergency Medicine

## 2023-09-28 ENCOUNTER — Encounter (HOSPITAL_COMMUNITY): Payer: Self-pay

## 2023-09-28 ENCOUNTER — Emergency Department (HOSPITAL_COMMUNITY)

## 2023-09-28 ENCOUNTER — Other Ambulatory Visit: Payer: Self-pay

## 2023-09-28 DIAGNOSIS — E039 Hypothyroidism, unspecified: Secondary | ICD-10-CM | POA: Insufficient documentation

## 2023-09-28 DIAGNOSIS — F17200 Nicotine dependence, unspecified, uncomplicated: Secondary | ICD-10-CM | POA: Diagnosis not present

## 2023-09-28 DIAGNOSIS — J441 Chronic obstructive pulmonary disease with (acute) exacerbation: Secondary | ICD-10-CM

## 2023-09-28 DIAGNOSIS — R110 Nausea: Secondary | ICD-10-CM | POA: Diagnosis not present

## 2023-09-28 DIAGNOSIS — E119 Type 2 diabetes mellitus without complications: Secondary | ICD-10-CM | POA: Diagnosis not present

## 2023-09-28 DIAGNOSIS — R1013 Epigastric pain: Secondary | ICD-10-CM | POA: Diagnosis not present

## 2023-09-28 DIAGNOSIS — I10 Essential (primary) hypertension: Secondary | ICD-10-CM | POA: Insufficient documentation

## 2023-09-28 DIAGNOSIS — R0602 Shortness of breath: Secondary | ICD-10-CM | POA: Diagnosis present

## 2023-09-28 LAB — COMPREHENSIVE METABOLIC PANEL WITH GFR
ALT: 22 U/L (ref 0–44)
AST: 77 U/L — ABNORMAL HIGH (ref 15–41)
Albumin: 3 g/dL — ABNORMAL LOW (ref 3.5–5.0)
Alkaline Phosphatase: 76 U/L (ref 38–126)
Anion gap: 12 (ref 5–15)
BUN: 7 mg/dL (ref 6–20)
CO2: 19 mmol/L — ABNORMAL LOW (ref 22–32)
Calcium: 7.4 mg/dL — ABNORMAL LOW (ref 8.9–10.3)
Chloride: 107 mmol/L (ref 98–111)
Creatinine, Ser: 0.39 mg/dL — ABNORMAL LOW (ref 0.44–1.00)
GFR, Estimated: >60 mL/min (ref >60)
Glucose, Bld: 110 mg/dL — ABNORMAL HIGH (ref 70–99)
Potassium: 3.8 mmol/L (ref 3.5–5.1)
Sodium: 138 mmol/L (ref 135–145)
Total Bilirubin: 0.4 mg/dL (ref 0.0–1.2)
Total Protein: 6.2 g/dL — ABNORMAL LOW (ref 6.5–8.1)

## 2023-09-28 LAB — URINALYSIS, ROUTINE W REFLEX MICROSCOPIC
Bilirubin Urine: NEGATIVE
Glucose, UA: NEGATIVE mg/dL
Hgb urine dipstick: NEGATIVE
Ketones, ur: NEGATIVE mg/dL
Leukocytes,Ua: NEGATIVE
Nitrite: NEGATIVE
Protein, ur: NEGATIVE mg/dL
Specific Gravity, Urine: 1.009 (ref 1.005–1.030)
pH: 5 (ref 5.0–8.0)

## 2023-09-28 LAB — CBC WITH DIFFERENTIAL/PLATELET
Abs Immature Granulocytes: 0.02 10*3/uL (ref 0.00–0.07)
Basophils Absolute: 0 10*3/uL (ref 0.0–0.1)
Basophils Relative: 0 %
Eosinophils Absolute: 0 10*3/uL (ref 0.0–0.5)
Eosinophils Relative: 1 %
HCT: 27.8 % — ABNORMAL LOW (ref 36.0–46.0)
Hemoglobin: 7.8 g/dL — ABNORMAL LOW (ref 12.0–15.0)
Immature Granulocytes: 0 %
Lymphocytes Relative: 25 %
Lymphs Abs: 1.4 10*3/uL (ref 0.7–4.0)
MCH: 19.6 pg — ABNORMAL LOW (ref 26.0–34.0)
MCHC: 28.1 g/dL — ABNORMAL LOW (ref 30.0–36.0)
MCV: 70 fL — ABNORMAL LOW (ref 80.0–100.0)
Monocytes Absolute: 0.5 10*3/uL (ref 0.1–1.0)
Monocytes Relative: 9 %
Neutro Abs: 3.7 10*3/uL (ref 1.7–7.7)
Neutrophils Relative %: 65 %
Platelets: 265 10*3/uL (ref 150–400)
RBC: 3.97 MIL/uL (ref 3.87–5.11)
RDW: 22 % — ABNORMAL HIGH (ref 11.5–15.5)
WBC: 5.6 10*3/uL (ref 4.0–10.5)
nRBC: 0 % (ref 0.0–0.2)

## 2023-09-28 LAB — RESP PANEL BY RT-PCR (RSV, FLU A&B, COVID)  RVPGX2
Influenza A by PCR: NEGATIVE
Influenza B by PCR: NEGATIVE
Resp Syncytial Virus by PCR: NEGATIVE
SARS Coronavirus 2 by RT PCR: NEGATIVE

## 2023-09-28 LAB — TROPONIN I (HIGH SENSITIVITY): Troponin I (High Sensitivity): <2 ng/L (ref <18)

## 2023-09-28 LAB — LIPASE, BLOOD: Lipase: 32 U/L (ref 11–51)

## 2023-09-28 LAB — BRAIN NATRIURETIC PEPTIDE: B Natriuretic Peptide: 39.2 pg/mL (ref 0.0–100.0)

## 2023-09-28 MED ORDER — CALCIUM CARBONATE ANTACID 500 MG PO CHEW
200.0000 mg | CHEWABLE_TABLET | Freq: Once | ORAL | Status: AC
  Administered 2023-09-28: 200 mg via ORAL
  Filled 2023-09-28: qty 1

## 2023-09-28 MED ORDER — ALBUTEROL SULFATE HFA 108 (90 BASE) MCG/ACT IN AERS
1.0000 | INHALATION_SPRAY | Freq: Once | RESPIRATORY_TRACT | Status: AC
  Administered 2023-09-28: 1 via RESPIRATORY_TRACT
  Filled 2023-09-28: qty 6.7

## 2023-09-28 MED ORDER — METHYLPREDNISOLONE SODIUM SUCC 125 MG IJ SOLR
125.0000 mg | Freq: Once | INTRAMUSCULAR | Status: AC
  Administered 2023-09-28: 125 mg via INTRAVENOUS
  Filled 2023-09-28: qty 2

## 2023-09-28 MED ORDER — PREDNISONE 10 MG (21) PO TBPK
ORAL_TABLET | Freq: Every day | ORAL | 0 refills | Status: DC
Start: 2023-09-28 — End: 2023-10-30

## 2023-09-28 MED ORDER — DOXYCYCLINE HYCLATE 100 MG PO CAPS: 100.0000 mg | ORAL_CAPSULE | Freq: Two times a day (BID) | ORAL | 0 refills | Status: DC

## 2023-09-28 MED ORDER — IPRATROPIUM-ALBUTEROL 0.5-2.5 (3) MG/3ML IN SOLN
3.0000 mL | Freq: Once | RESPIRATORY_TRACT | Status: AC
  Administered 2023-09-28: 3 mL via RESPIRATORY_TRACT
  Filled 2023-09-28: qty 3

## 2023-09-28 NOTE — ED Provider Notes (Signed)
 [place] EMERGENCY DEPARTMENT AT [facility] Provider Note   CSN: [id number] Arrival date & time: 09/28/23  1625     History  Chief Complaint  Patient presents with   Shortness of Breath    [name] is a 58 y.o. female history of COPD, A-fib, hypertension, hyperlipidemia, EtOH abuse, polysubstance abuse, bipolar and schizophrenia presents with complaints of shortness of breath and chest pain that started earlier today.  Denies significant cough, congestion or fever.  She does feel nauseous and endorses epigastric pain.  No emesis or diarrhea.  Endorses daily consumption of EtOH.   Shortness of Breath     Past Medical History:  Diagnosis Date   Abdominal pain    Accidental drug overdose April 2013   Anxiety    Atrial fibrillation (HCC) 09/29/11   converted spontaneously   Chronic back pain    Chronic knee pain    Chronic nausea    Chronic pain    COPD (chronic obstructive pulmonary disease) (HCC)    Depression    Diabetes mellitus    states her doctor took her off all DM meds in past month   Diabetic neuropathy (HCC)    Dyspnea    with exertion    GERD (gastroesophageal reflux disease)    Headache(784.0)    migraines    HTN (hypertension)    not on meds since in a year    Hyperlipidemia    Hypothyroidism    not on meds in a while    Mental disorder    Bipolar and schizophrenic   Nausea and vomiting 01/02/2023   Requires supplemental oxygen    as needed per patient    Schizophrenia (HCC)    Schizophrenia, acute (HCC) 11/13/2017   Tobacco abuse      Home Medications Prior to Admission medications   Medication Sig Start Date End Date Taking? Authorizing Provider  albuterol (VENTOLIN HFA) 108 (90 Base) MCG/ACT inhaler Inhale 1-2 puffs into the lungs every 6 (six) hours as needed for wheezing or shortness of breath. Patient not taking: Reported on 09/15/2023 07/15/23   Gilda Crease, MD  budesonide-formoterol [facility]) 160-4.5 MCG/ACT  inhaler Inhale 2 puffs into the lungs 2 (two) times daily. Patient not taking: Reported on 09/15/2023 02/25/23   Sabas Sous, MD  cefdinir (OMNICEF) 300 MG capsule Take 1 capsule (300 mg total) by mouth 2 (two) times daily. Patient not taking: Reported on 09/15/2023 07/16/23   Tilden Fossa, MD  famotidine (PEPCID) 20 MG tablet Take 1 tablet (20 mg total) by mouth daily. Patient not taking: Reported on 05/02/2023 02/25/23   Sabas Sous, MD  ferrous sulfate 325 (65 FE) MG tablet Take 1 tablet (325 mg total) by mouth every other day. Patient not taking: Reported on 05/02/2023 03/18/23 10/02/23  Zigmund Daniel., MD  fluticasone-salmeterol (ADVAIR) 250-50 MCG/ACT AEPB Inhale 1 puff into the lungs in the morning and at bedtime. Patient not taking: Reported on 09/15/2023    [provider]  furosemide (LASIX) 20 MG tablet Take 1 tablet (20 mg total) by mouth daily. Patient not taking: Reported on 05/02/2023 02/24/23   Achille Rich, PA-C  gabapentin (NEURONTIN) 300 MG capsule Take 1 capsule (300 mg total) by mouth 3 (three) times daily. 05/01/23   Sarina Ill, DO  hyoscyamine (LEVSIN/SL) 0.125 MG SL tablet Place 1 tablet (0.125 mg total) under the tongue every 4 (four) hours as needed (pain). Up to 1.25 mg daily Patient not taking:  Reported on 05/02/2023 01/23/23   Arthor Captain, PA-C  ipratropium-albuterol (DUONEB) 0.5-2.5 (3) MG/3ML SOLN Take 3 mLs by nebulization every 6 (six) hours as needed (for shortness of breath or wheezing). Patient not taking: Reported on 09/15/2023    [provider]  levETIRAcetam (KEPPRA XR) 500 MG 24 hr tablet Take 1 tablet (500 mg total) by mouth 2 (two) times daily. Patient not taking: Reported on 09/15/2023 07/07/23   Lonell Grandchild, MD  metFORMIN (GLUCOPHAGE-XR) 750 MG 24 hr tablet Take 1 tablet (750 mg total) by mouth daily. 06/17/23 07/17/23  Earney Navy, NP  nicotine (NICODERM CQ - DOSED IN MG/24 HOURS) 21 mg/24hr patch Place  1 patch (21 mg total) onto the skin daily. Patient not taking: Reported on 09/15/2023 06/16/23   Earney Navy, NP  nicotine polacrilex (NICORETTE) 2 MG gum Take 1 each (2 mg total) by mouth as needed for smoking cessation. Patient not taking: Reported on 09/15/2023 06/16/23   Earney Navy, NP  ondansetron (ZOFRAN) 4 MG tablet Take 1 tablet (4 mg total) by mouth every 6 (six) hours. Patient not taking: Reported on 05/02/2023 01/20/23   Prosperi, Christian H, PA-C  ondansetron (ZOFRAN-ODT) 4 MG disintegrating tablet Take 1 tablet (4 mg total) by mouth every 8 (eight) hours as needed. Patient not taking: Reported on 09/15/2023 07/16/23   Tilden Fossa, MD  OXYGEN Inhale 4-5 L/min into the lungs as needed (for COPD-related symptoms). Patient not taking: Reported on 09/15/2023    [provider]  pantoprazole (PROTONIX) 40 MG tablet Take 1 tablet (40 mg total) by mouth daily. Patient not taking: Reported on 06/12/2023 03/17/23 06/12/23  Zigmund Daniel., MD  predniSONE (DELTASONE) 10 MG tablet Take 4 tablets (40 mg total) by mouth daily. Patient not taking: Reported on 09/15/2023 07/16/23   Tilden Fossa, MD  sucralfate (CARAFATE) 1 g tablet Take 1 tablet (1 g total) by mouth 4 (four) times daily -  with meals and at bedtime. Patient not taking: Reported on 05/02/2023 02/11/23   Horton, Mayer Masker, MD  tiZANidine (ZANAFLEX) 2 MG tablet Take 2 mg by mouth at bedtime. Patient not taking: Reported on 05/02/2023 02/19/23   [provider]  zonisamide (ZONEGRAN) 100 MG capsule Take 2 capsules (200 mg total) by mouth daily. Patient not taking: Reported on 06/12/2023 01/12/23   Lewie Chamber, MD      Allergies    Iron dextran, Aspirin, and Penicillins    Review of Systems   Review of Systems  Respiratory:  Positive for shortness of breath.     Physical Exam Updated Vital Signs BP 125/86   Pulse (!) 109   Temp 98.2 F (36.8 C) (Oral)   Resp 19   LMP 01/08/2011    SpO2 100%  Physical Exam Vitals and nursing note reviewed.  Constitutional:      General: She is not in acute distress.    Appearance: She is well-developed.  HENT:     Head: Normocephalic and atraumatic.  Eyes:     Conjunctiva/sclera: Conjunctivae normal.  Cardiovascular:     Rate and Rhythm: Normal rate and regular rhythm.     Heart sounds: No murmur heard. Pulmonary:     Effort: Pulmonary effort is normal. No respiratory distress.     Comments: Diffuse rhonchi without rales Abdominal:     Palpations: Abdomen is soft.     Tenderness: There is abdominal tenderness.     Comments: Mild epigastric tenderness, without rebound or guarding,  no right upper quadrant tenderness negative Murphy's, no tenderness to McBurney's point  Musculoskeletal:        General: No swelling.     Cervical back: Neck supple.  Skin:    General: Skin is warm and dry.     Capillary Refill: Capillary refill takes less than 2 seconds.  Neurological:     Mental Status: She is alert.  Psychiatric:        Mood and Affect: Mood normal.     ED Results / Procedures / Treatments   Labs (all labs ordered are listed, but only abnormal results are displayed) Labs Reviewed  CBC WITH DIFFERENTIAL/PLATELET - Abnormal; Notable for the following components:      Result Value   Hemoglobin 7.8 (*)    HCT 27.8 (*)    MCV 70.0 (*)    MCH 19.6 (*)    MCHC 28.1 (*)    RDW 22.0 (*)    All other components within normal limits  COMPREHENSIVE METABOLIC PANEL WITH GFR - Abnormal; Notable for the following components:   CO2 19 (*)    Glucose, Bld 110 (*)    Creatinine, Ser 0.39 (*)    Calcium 7.4 (*)    Total Protein 6.2 (*)    Albumin 3.0 (*)    AST 77 (*)    All other components within normal limits  RESP PANEL BY RT-PCR (RSV, FLU A&B, COVID)  RVPGX2  URINALYSIS, ROUTINE W REFLEX MICROSCOPIC  BRAIN NATRIURETIC PEPTIDE  LIPASE, BLOOD  TROPONIN I (HIGH SENSITIVITY)  TROPONIN I (HIGH SENSITIVITY)     EKG None  Radiology DG Chest 2 View Result Date: 09/28/2023 CLINICAL DATA:  Shortness of breath. EXAM: CHEST - 2 VIEW COMPARISON:  September 23, 2023 FINDINGS: The heart size and mediastinal contours are within normal limits. A trace amount of linear scarring and/or atelectasis is seen along the periphery of the right lung base. There is no evidence of an acute infiltrate, pleural effusion or pneumothorax. The visualized skeletal structures are unremarkable. IMPRESSION: No active cardiopulmonary disease. Electronically Signed   By: Aram Candela M.D.   On: 09/28/2023 20:32    Procedures Procedures    Medications Ordered in ED Medications  albuterol (VENTOLIN HFA) 108 (90 Base) MCG/ACT inhaler 1 puff (has no administration in time range)  ipratropium-albuterol (DUONEB) 0.5-2.5 (3) MG/3ML nebulizer solution 3 mL (3 mLs Nebulization Given 09/28/23 1834)  methylPREDNISolone sodium succinate (SOLU-MEDROL) 125 mg/2 mL injection 125 mg (125 mg Intravenous Given 09/28/23 1834)  ipratropium-albuterol (DUONEB) 0.5-2.5 (3) MG/3ML nebulizer solution 3 mL (3 mLs Nebulization Given 09/28/23 1940)  ipratropium-albuterol (DUONEB) 0.5-2.5 (3) MG/3ML nebulizer solution 3 mL (3 mLs Nebulization Given 09/28/23 1940)  calcium carbonate (TUMS - dosed in mg elemental calcium) chewable tablet 200 mg of elemental calcium (200 mg of elemental calcium Oral Given 09/28/23 2008)    ED Course/ Medical Decision Making/ A&P                                 Medical Decision Making Amount and/or Complexity of Data Reviewed Labs: ordered. Radiology: ordered.  Risk Prescription drug management.   This patient presents to the ED with chief complaint(s) of SOB and CP.  The complaint involves an extensive differential diagnosis and also carries with it a high risk of complications and morbidity.   pertinent past medical history as listed in HPI  The differential diagnosis includes  ACS, PE, aortic  dissection, pneumonia,  pneumothorax,esophageal dissection, URI, bronchitis, COPD, asthma, CHF, peritonsillar abscess, anaphylaxis  Additional history obtained:  Records reviewed Care Everywhere/External Records  Initial Assessment:   Patient presents tachycardic and tachypneic with complaints of chest pain and shortness of breath.  She does have a history of COPD and reports she ran out of her inhalers recently.  On exam she does have diffuse rhonchi without rales.  No cough, congestion or fever to suggest URI or pneumonia.  Endorses chest pain/epigastric pain.  Reports no cardiac history, although chart review demonstrates history of A-fib.  Endorses nausea without emesis or diarrhea.  Denies any known history of pancreatitis, although she does EtOH abuse.  Will start with basic labs, chest x-ray, EKG and breathing treatments.  Independent ECG interpretation:  Sinus tachycardia without ischemic changes  Independent labs interpretation:  The following labs were independently interpreted:  CBC with hemoglobin of 7.9, near baseline of 8, UA unremarkable, respiratory panel negative, troponin without elevation, BMP within normal limits, lipase within normal limits  Independent visualization and interpretation of imaging: I independently visualized the following imaging with scope of interpretation limited to determining acute life threatening conditions related to emergency care: Chest x-ray, which revealed no active cardiopulmonary disease  Treatment and Reassessment: Patient given DuoNeb x 3 and Solu-Medrol following for assessment  Upon reassessment patient reports marked improvement of symptoms.  She is not requiring oxygen and when ambulating she maintains saturations around 98%.  She feels comfortable going home.  Consultations obtained:   none  Disposition:   Patient will be discharged home.  Provided inhaler.  Prescription for prednisone and doxycycline sent to pharmacy for COPD exacerbation.  Noted to be  anemic but near baseline and asymptomatic encouraged follow-up with PCP to repeat check.  The patient has been appropriately medically screened and/or stabilized in the ED. I have low suspicion for any other emergent medical condition which would require further screening, evaluation or treatment in the ED or require inpatient management. At time of discharge the patient is hemodynamically stable and in no acute distress. I have discussed work-up results and diagnosis with patient and answered all questions. Patient is agreeable with discharge plan. We discussed strict return precautions for returning to the emergency department and they verbalized understanding.     Social Determinants of Health:   none  This note was dictated with voice recognition software.  Despite best efforts at proofreading, errors may have occurred which can change the documentation meaning.          Final Clinical Impression(s) / ED Diagnoses Final diagnoses:  COPD exacerbation [facility])    Rx / DC Orders ED Discharge Orders     None         Fabienne Bruns 09/28/23 2114    Benjiman Core, MD 09/28/23 [phone]

## 2023-09-28 NOTE — Discharge Instructions (Addendum)
 You were evaluated in the emergency room for chest pain and shortness of breath.  Your workup was consistent with an COPD exacerbation.  You were also noted to have a low hemoglobin.  Please follow with your primary care doctor for repeat blood check.  Prescription for steroids and antibiotics was sent into her pharmacy.

## 2023-09-28 NOTE — ED Notes (Signed)
 Pt ambulated to restroom and back. Denied shortness of breath and dizziness. Room Air 98%

## 2023-09-28 NOTE — ED Triage Notes (Signed)
 BIBA from home for [facility], COPD hx, current smoker. 5 mg albuterol given PTA. 120/70 BP 100% on neb 105 HR

## 2023-09-28 NOTE — ED Notes (Signed)
 Unsuccessful IV attempt x2.

## 2023-10-07 ENCOUNTER — Emergency Department (HOSPITAL_COMMUNITY)
Admission: EM | Admit: 2023-10-07 | Discharge: 2023-10-07 | Disposition: A | Discharge status: Home / Self Care | Attending: Emergency Medicine | Admitting: Emergency Medicine

## 2023-10-07 ENCOUNTER — Emergency Department (HOSPITAL_COMMUNITY)

## 2023-10-07 ENCOUNTER — Other Ambulatory Visit: Payer: Self-pay

## 2023-10-07 ENCOUNTER — Encounter (HOSPITAL_COMMUNITY): Payer: Self-pay | Admitting: Emergency Medicine

## 2023-10-07 DIAGNOSIS — R519 Headache, unspecified: Secondary | ICD-10-CM | POA: Diagnosis not present

## 2023-10-07 DIAGNOSIS — M542 Cervicalgia: Secondary | ICD-10-CM | POA: Diagnosis present

## 2023-10-07 MED ORDER — OXYCODONE HCL 5 MG PO TABS: 5.0000 mg | ORAL_TABLET | Freq: Once | ORAL | Status: DC

## 2023-10-07 MED ORDER — ACETAMINOPHEN 500 MG PO TABS
1000.0000 mg | ORAL_TABLET | Freq: Once | ORAL | Status: AC
  Administered 2023-10-07: 1000 mg via ORAL
  Filled 2023-10-07: qty 2
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
## 2023-10-07 NOTE — Discharge Instructions (Signed)
 Follow up with your doctor in the office.   Smoking is bad for you.  There is help in this paperwork if you want to quit.  Take 4 over the counter ibuprofen tablets 3 times a day or 2 over-the-counter naproxen tablets twice a day for pain. Also take tylenol 1000mg (2 extra strength) four times a day.

## 2023-10-23 ENCOUNTER — Other Ambulatory Visit: Payer: Self-pay

## 2023-10-23 ENCOUNTER — Encounter (HOSPITAL_COMMUNITY): Payer: Self-pay

## 2023-10-23 ENCOUNTER — Emergency Department (HOSPITAL_COMMUNITY)
Admission: EM | Admit: 2023-10-23 | Discharge: 2023-10-23 | Disposition: A | Discharge status: Home / Self Care | Attending: Emergency Medicine | Admitting: Emergency Medicine

## 2023-10-23 ENCOUNTER — Ambulatory Visit: Admitting: Adult Health

## 2023-10-23 DIAGNOSIS — Z79899 Other long term (current) drug therapy: Secondary | ICD-10-CM | POA: Insufficient documentation

## 2023-10-23 DIAGNOSIS — Z7984 Long term (current) use of oral hypoglycemic drugs: Secondary | ICD-10-CM | POA: Diagnosis not present

## 2023-10-23 DIAGNOSIS — R1084 Generalized abdominal pain: Secondary | ICD-10-CM | POA: Diagnosis present

## 2023-10-23 DIAGNOSIS — E119 Type 2 diabetes mellitus without complications: Secondary | ICD-10-CM | POA: Insufficient documentation

## 2023-10-23 DIAGNOSIS — I10 Essential (primary) hypertension: Secondary | ICD-10-CM | POA: Diagnosis not present

## 2023-10-23 DIAGNOSIS — D72829 Elevated white blood cell count, unspecified: Secondary | ICD-10-CM | POA: Insufficient documentation

## 2023-10-23 DIAGNOSIS — R110 Nausea: Secondary | ICD-10-CM | POA: Diagnosis not present

## 2023-10-23 DIAGNOSIS — J449 Chronic obstructive pulmonary disease, unspecified: Secondary | ICD-10-CM | POA: Insufficient documentation

## 2023-10-23 DIAGNOSIS — Z7951 Long term (current) use of inhaled steroids: Secondary | ICD-10-CM | POA: Diagnosis not present

## 2023-10-23 LAB — CBC
HCT: 30.8 % — ABNORMAL LOW (ref 36.0–46.0)
Hemoglobin: 8.6 g/dL — ABNORMAL LOW (ref 12.0–15.0)
MCH: 20.2 pg — ABNORMAL LOW (ref 26.0–34.0)
MCHC: 27.9 g/dL — ABNORMAL LOW (ref 30.0–36.0)
MCV: 72.3 fL — ABNORMAL LOW (ref 80.0–100.0)
Platelets: 156 10*3/uL (ref 150–400)
RBC: 4.26 MIL/uL (ref 3.87–5.11)
RDW: 21.9 % — ABNORMAL HIGH (ref 11.5–15.5)
WBC: 10.7 10*3/uL — ABNORMAL HIGH (ref 4.0–10.5)
nRBC: 0 % (ref 0.0–0.2)

## 2023-10-23 LAB — COMPREHENSIVE METABOLIC PANEL WITH GFR
ALT: 22 U/L (ref 0–44)
AST: 57 U/L — ABNORMAL HIGH (ref 15–41)
Albumin: 3.2 g/dL — ABNORMAL LOW (ref 3.5–5.0)
Alkaline Phosphatase: 107 U/L (ref 38–126)
Anion gap: 12 (ref 5–15)
BUN: <5 mg/dL — ABNORMAL LOW (ref 6–20)
CO2: 22 mmol/L (ref 22–32)
Calcium: 8.2 mg/dL — ABNORMAL LOW (ref 8.9–10.3)
Chloride: 102 mmol/L (ref 98–111)
Creatinine, Ser: 0.61 mg/dL (ref 0.44–1.00)
GFR, Estimated: >60 mL/min (ref >60)
Glucose, Bld: 156 mg/dL — ABNORMAL HIGH (ref 70–99)
Potassium: 4.9 mmol/L (ref 3.5–5.1)
Sodium: 136 mmol/L (ref 135–145)
Total Bilirubin: 1.1 mg/dL (ref 0.0–1.2)
Total Protein: 6.7 g/dL (ref 6.5–8.1)

## 2023-10-23 LAB — URINALYSIS, ROUTINE W REFLEX MICROSCOPIC
Bilirubin Urine: NEGATIVE
Glucose, UA: NEGATIVE mg/dL
Ketones, ur: NEGATIVE mg/dL
Nitrite: NEGATIVE
Protein, ur: NEGATIVE mg/dL
Specific Gravity, Urine: 1.012 (ref 1.005–1.030)
pH: 5 (ref 5.0–8.0)

## 2023-10-23 LAB — RAPID URINE DRUG SCREEN, HOSP PERFORMED
Amphetamines: NOT DETECTED
Barbiturates: NOT DETECTED
Benzodiazepines: NOT DETECTED
Cocaine: POSITIVE — AB
Opiates: NOT DETECTED
Tetrahydrocannabinol: NOT DETECTED

## 2023-10-23 LAB — LIPASE, BLOOD
Lipase: 48 U/L (ref 11–51)
Lipase: 46 U/L (ref 11–51)

## 2023-10-23 LAB — ETHANOL: Alcohol, Ethyl (B): <15 mg/dL (ref <15)

## 2023-10-23 LAB — I-STAT CG4 LACTIC ACID, ED
Lactic Acid, Venous: 2.8 mmol/L (ref 0.5–1.9)
Lactic Acid, Venous: 1.5 mmol/L (ref 0.5–1.9)

## 2023-10-23 MED ORDER — LORAZEPAM 1 MG PO TABS
1.0000 mg | ORAL_TABLET | Freq: Once | ORAL | Status: AC
  Administered 2023-10-23: 1 mg via ORAL
  Filled 2023-10-23: qty 1

## 2023-10-23 MED ORDER — MORPHINE SULFATE (PF) 4 MG/ML IV SOLN
4.0000 mg | Freq: Once | INTRAVENOUS | Status: AC
  Administered 2023-10-23: 4 mg via INTRAVENOUS
  Filled 2023-10-23: qty 1

## 2023-10-23 MED ORDER — LIDOCAINE VISCOUS HCL 2 % MT SOLN
15.0000 mL | Freq: Once | OROMUCOSAL | Status: AC
  Administered 2023-10-23: 15 mL via ORAL
  Filled 2023-10-23: qty 15

## 2023-10-23 MED ORDER — SODIUM CHLORIDE 0.9 % IV BOLUS
1000.0000 mL | Freq: Once | INTRAVENOUS | Status: AC
  Administered 2023-10-23: 1000 mL via INTRAVENOUS

## 2023-10-23 MED ORDER — MORPHINE SULFATE (PF) 4 MG/ML IV SOLN: 4.0000 mg | Freq: Once | INTRAVENOUS | Status: DC

## 2023-10-23 MED ORDER — ALUM & MAG HYDROXIDE-SIMETH 200-200-20 MG/5ML PO SUSP
30.0000 mL | Freq: Once | ORAL | Status: AC
  Administered 2023-10-23: 30 mL via ORAL
  Filled 2023-10-23: qty 30

## 2023-10-23 MED ORDER — ONDANSETRON HCL 4 MG/2ML IJ SOLN
4.0000 mg | Freq: Once | INTRAMUSCULAR | Status: AC
  Administered 2023-10-23: 4 mg via INTRAVENOUS
  Filled 2023-10-23: qty 2

## 2023-10-23 NOTE — Discharge Instructions (Signed)
 Please use your home nausea medication as needed for any ongoing nausea, vomiting, I would refrain from continuing to drink or use drugs as this is likely to worsen your abdominal pain.  Please return to the emergency department if you have worsening symptoms despite treatment.

## 2023-10-23 NOTE — ED Provider Notes (Signed)
 [facility] Provider Note   CSN: [id number] Arrival date & time: 10/23/23  1914     History  Chief Complaint  Patient presents with   Abdominal Pain    [name] is a 58 y.o. female with past medical history significant for alcohol  use disorder, COPD, hypertension, diabetes, schizoaffective disorder, polysubstance abuse, chronic pain, schizophrenia who presents concern for abdominal pain, nausea, vomiting, diarrhea since this morning.  She feels like she might of passed out although she does not recall.  She does endorse drinking some yesterday.  She denies any SI, HI.  She denies any chest pain, shortness of breath.  She rates her pain 10/10.   Abdominal Pain      Home Medications Prior to Admission medications   Medication Sig Start Date End Date Taking? Authorizing Provider  albuterol  (VENTOLIN  HFA) 108 (90 Base) MCG/ACT inhaler Inhale 1-2 puffs into the lungs every 6 (six) hours as needed for wheezing or shortness of breath. Patient not taking: Reported on 09/15/2023 07/15/23   Pollina, Christopher J, MD  budesonide -formoterol  (SYMBICORT ) 160-4.5 MCG/ACT inhaler Inhale 2 puffs into the lungs 2 (two) times daily. Patient not taking: Reported on 09/15/2023 02/25/23   Edson Graces, MD  cefdinir  (OMNICEF ) 300 MG capsule Take 1 capsule (300 mg total) by mouth 2 (two) times daily. Patient not taking: Reported on 09/15/2023 07/16/23   Kelsey Patricia, MD  doxycycline  (VIBRAMYCIN ) 100 MG capsule Take 1 capsule (100 mg total) by mouth 2 (two) times daily. 09/28/23   Felicie Horning, PA-C  famotidine  (PEPCID ) 20 MG tablet Take 1 tablet (20 mg total) by mouth daily. Patient not taking: Reported on 05/02/2023 02/25/23   Edson Graces, MD  ferrous sulfate  325 (65 FE) MG tablet Take 1 tablet (325 mg total) by mouth every other day. Patient not taking: Reported on 05/02/2023 03/18/23 10/02/23  Etter Hermann., MD  fluticasone -salmeterol  (ADVAIR) 250-50 MCG/ACT AEPB Inhale 1 puff into the lungs in the morning and at bedtime. Patient not taking: Reported on 09/15/2023    [provider]  furosemide  (LASIX ) 20 MG tablet Take 1 tablet (20 mg total) by mouth daily. Patient not taking: Reported on 05/02/2023 02/24/23   Spence Dux, PA-C  gabapentin  (NEURONTIN ) 300 MG capsule Take 1 capsule (300 mg total) by mouth 3 (three) times daily. 05/01/23   Juliana Ocean, DO  hyoscyamine  (LEVSIN/SL) 0.125 MG SL tablet Place 1 tablet (0.125 mg total) under the tongue every 4 (four) hours as needed (pain). Up to 1.25 mg daily Patient not taking: Reported on 05/02/2023 01/23/23   Harris, Abigail, PA-C  ipratropium-albuterol  (DUONEB) 0.5-2.5 (3) MG/3ML SOLN Take 3 mLs by nebulization every 6 (six) hours as needed (for shortness of breath or wheezing). Patient not taking: Reported on 09/15/2023    [provider]  levETIRAcetam  (KEPPRA  XR) 500 MG 24 hr tablet Take 1 tablet (500 mg total) by mouth 2 (two) times daily. Patient not taking: Reported on 09/15/2023 07/07/23   Mordecai Applebaum, MD  metFORMIN  (GLUCOPHAGE -XR) 750 MG 24 hr tablet Take 1 tablet (750 mg total) by mouth daily. 06/17/23 07/17/23  Onuoha, Josephine C, NP  nicotine  (NICODERM CQ  - DOSED IN MG/24 HOURS) 21 mg/24hr patch Place 1 patch (21 mg total) onto the skin daily. Patient not taking: Reported on 09/15/2023 06/16/23   Onuoha, Josephine C, NP  nicotine  polacrilex (NICORETTE ) 2 MG gum Take 1 each (2 mg total) by mouth as  needed for smoking cessation. Patient not taking: Reported on 09/15/2023 06/16/23   Onuoha, Josephine C, NP  ondansetron  (ZOFRAN ) 4 MG tablet Take 1 tablet (4 mg total) by mouth every 6 (six) hours. Patient not taking: Reported on 05/02/2023 01/20/23   Travis Mastel H, PA-C  ondansetron  (ZOFRAN -ODT) 4 MG disintegrating tablet Take 1 tablet (4 mg total) by mouth every 8 (eight) hours as needed. Patient not taking: Reported on 09/15/2023 07/16/23    Kelsey Patricia, MD  OXYGEN  Inhale 4-5 L/min into the lungs as needed (for COPD-related symptoms). Patient not taking: Reported on 09/15/2023    [provider]  pantoprazole  (PROTONIX ) 40 MG tablet Take 1 tablet (40 mg total) by mouth daily. Patient not taking: Reported on 06/12/2023 03/17/23 06/12/23  Etter Hermann., MD  predniSONE  (DELTASONE ) 10 MG tablet Take 4 tablets (40 mg total) by mouth daily. Patient not taking: Reported on 09/15/2023 07/16/23   Kelsey Patricia, MD  predniSONE  (STERAPRED UNI-PAK 21 TAB) 10 MG (21) TBPK tablet Take by mouth daily. Take 6 tabs by mouth daily  for 2 days, then 5 tabs for 2 days, then 4 tabs for 2 days, then 3 tabs for 2 days, 2 tabs for 2 days, then 1 tab by mouth daily for 2 days 09/28/23   Felicie Horning, PA-C  sucralfate  (CARAFATE ) 1 g tablet Take 1 tablet (1 g total) by mouth 4 (four) times daily -  with meals and at bedtime. Patient not taking: Reported on 05/02/2023 02/11/23   Horton, Vonzella Guernsey, MD  tiZANidine  (ZANAFLEX ) 2 MG tablet Take 2 mg by mouth at bedtime. Patient not taking: Reported on 05/02/2023 02/19/23   [provider]  zonisamide  (ZONEGRAN ) 100 MG capsule Take 2 capsules (200 mg total) by mouth daily. Patient not taking: Reported on 06/12/2023 01/12/23   Faith Homes, MD      Allergies    Iron  dextran, Aspirin , and Penicillins    Review of Systems   Review of Systems  Gastrointestinal:  Positive for abdominal pain.  All other systems reviewed and are negative.   Physical Exam Updated Vital Signs BP 127/72 (BP Location: Left Arm)   Pulse 100   Resp 18   Wt 60 kg   LMP 01/08/2011   SpO2 96%   BMI 24.19 kg/m  Physical Exam Vitals and nursing note reviewed.  Constitutional:      General: She is not in acute distress.    Appearance: Normal appearance.  HENT:     Head: Normocephalic and atraumatic.  Eyes:     General:        Right eye: No discharge.        Left eye: No discharge.   Cardiovascular:     Rate and Rhythm: Normal rate and regular rhythm.     Heart sounds: No murmur heard.    No friction rub. No gallop.  Pulmonary:     Effort: Pulmonary effort is normal.     Breath sounds: Normal breath sounds.  Abdominal:     General: Bowel sounds are normal.     Palpations: Abdomen is soft.     Comments: Diffuse tenderness throughout the abdomen, most focally in the epigastric region, no rebound, rigidity, guarding  Skin:    General: Skin is warm and dry.     Capillary Refill: Capillary refill takes less than 2 seconds.  Neurological:     Mental Status: She is alert and oriented to person, place, and time.  Psychiatric:  Mood and Affect: Mood normal.        Behavior: Behavior normal.     ED Results / Procedures / Treatments   Labs (all labs ordered are listed, but only abnormal results are displayed) Labs Reviewed  CBC - Abnormal; Notable for the following components:      Result Value   WBC 10.7 (*)    Hemoglobin 8.6 (*)    HCT 30.8 (*)    MCV 72.3 (*)    MCH 20.2 (*)    MCHC 27.9 (*)    RDW 21.9 (*)    All other components within normal limits  URINALYSIS, ROUTINE W REFLEX MICROSCOPIC - Abnormal; Notable for the following components:   APPearance HAZY (*)    Hgb urine dipstick SMALL (*)    Leukocytes,Ua MODERATE (*)    Bacteria, UA RARE (*)    All other components within normal limits  RAPID URINE DRUG SCREEN, HOSP PERFORMED - Abnormal; Notable for the following components:   Cocaine POSITIVE (*)    All other components within normal limits  COMPREHENSIVE METABOLIC PANEL WITH GFR - Abnormal; Notable for the following components:   Glucose, Bld 156 (*)    BUN <5 (*)    Calcium  8.2 (*)    Albumin  3.2 (*)    AST 57 (*)    All other components within normal limits  I-STAT CG4 LACTIC ACID, ED - Abnormal; Notable for the following components:   Lactic Acid, Venous 2.8 (*)    All other components within normal limits  LIPASE, BLOOD   COMPREHENSIVE METABOLIC PANEL WITH GFR  ETHANOL  LIPASE, BLOOD  I-STAT CG4 LACTIC ACID, ED    EKG None  Radiology No results found.  Procedures Procedures    Medications Ordered in ED Medications  ondansetron  (ZOFRAN ) injection 4 mg (4 mg Intravenous Given 10/23/23 0841)  sodium chloride  0.9 % bolus 1,000 mL (0 mLs Intravenous Stopped 10/23/23 0931)  alum & mag hydroxide-simeth (MAALOX/MYLANTA) 200-200-20 MG/5ML suspension 30 mL (30 mLs Oral Given 10/23/23 0750)    And  lidocaine  (XYLOCAINE ) 2 % viscous mouth solution 15 mL (15 mLs Oral Given 10/23/23 0751)  morphine  (PF) 4 MG/ML injection 4 mg (4 mg Intravenous Given 10/23/23 0855)  LORazepam  (ATIVAN ) tablet 1 mg (1 mg Oral Given 10/23/23 7829)    ED Course/ Medical Decision Making/ A&P                                 Medical Decision Making Amount and/or Complexity of Data Reviewed Labs: ordered.  Risk Prescription drug management.   This patient is a 58 y.o. female  who presents to the ED for concern of abdominal pain, nausea, vomiting.   Differential diagnoses prior to evaluation: The emergent differential diagnosis includes, but is not limited to,  The causes of generalized abdominal pain include but are not limited to AAA, mesenteric ischemia, appendicitis, diverticulitis, DKA, gastritis, gastroenteritis, AMI, nephrolithiasis, pancreatitis, peritonitis, adrenal insufficiency,lead poisoning, iron  toxicity, intestinal ischemia, constipation, UTI,SBO/LBO, splenic rupture, biliary disease, IBD, IBS, PUD, or hepatitis. This is not an exhaustive differential.   Past Medical History / Co-morbidities / Social History: Reivewed labwork, imaging from previous ED visits  Additional history: Chart reviewed. Pertinent results include: Reviewed frequent past ED visits, she has been seen frequently in the past for EtOH, drug abuse,   Physical Exam: Physical exam performed. The pertinent findings include:  Diffuse tenderness throughout  the abdomen,  most focally in the epigastric region, no rebound, rigidity, guarding   VSS in ED  Seems somewhat intoxicated still. Denies SI/HI.  Lab Tests/Imaging studies: I personally interpreted labs/imaging and the pertinent results include:  CMP overall unremarkable, mildly elevated glucose at 156, her AST is mildly elevated 57, likely secondary to EtOH abuse, CBC notable for leukocytosis, blood cells 10.7, anemia, hemoglobin 8.6 is fairly stable compared to her baseline.  Lipase normal, initial lactic acid was elevated 2.8, likely secondary to dehydration, cocaine use, alcohol  use, improved 1.5 on recheck.  Her ethanol level is negative at this time.  Her UDS is positive for cocaine.  UA with moderate leukocytes and rare bacteria, but patient denying any dysuria, do not feel that this is representative of a true UTI and will defer treatment at this time after discussion with patient.   Medications: I ordered medication including fluid bolus, morphine , Zofran , GI cocktail for abdominal pain, nausea, Ativan  for some tremors likely related to her history of alcohol  abuse, she may be having some mild withdrawals in the ED.  She is without severe tachycardia and is not hypertensive, no evidence of developing DTs, stable for discharge despite some withdrawal symptoms developing..  I have reviewed the patients home medicines and have made adjustments as needed.   Disposition: After consideration of the diagnostic results and the patients response to treatment, I feel that patient is stable for discharge with plan as above, she feels improved after medication and abdominal pain has resolved, suspect gastritis or other complication related to her chronic alcohol  and cocaine use..   emergency department workup does not suggest an emergent condition requiring admission or immediate intervention beyond what has been performed at this time. The plan is: as above. The patient is safe for discharge and has been  instructed to return immediately for worsening symptoms, change in symptoms or any other concerns.  Final Clinical Impression(s) / ED Diagnoses Final diagnoses:  Generalized abdominal pain  Nausea    Rx / DC Orders ED Discharge Orders     None         Stefan Edge 10/23/23 1007    Kelsey Patricia, MD 10/23/23 2305

## 2023-10-23 NOTE — ED Triage Notes (Signed)
 BIB EMS/ ABD pain, N/V/D, ETOH/ non-compliant with meds/ started @ 0430 this am/ anxiety/ husband recently passed away/ pt is A&Ox4

## 2023-10-25 ENCOUNTER — Other Ambulatory Visit: Payer: Self-pay

## 2023-10-25 ENCOUNTER — Encounter (HOSPITAL_COMMUNITY): Payer: Self-pay

## 2023-10-25 ENCOUNTER — Emergency Department (HOSPITAL_COMMUNITY)
Admission: EM | Admit: 2023-10-25 | Discharge: 2023-10-25 | Disposition: A | Discharge status: Home / Self Care | Attending: Emergency Medicine | Admitting: Emergency Medicine

## 2023-10-25 DIAGNOSIS — E860 Dehydration: Secondary | ICD-10-CM | POA: Diagnosis not present

## 2023-10-25 DIAGNOSIS — R000 Tachycardia, unspecified: Secondary | ICD-10-CM | POA: Insufficient documentation

## 2023-10-25 DIAGNOSIS — Z7951 Long term (current) use of inhaled steroids: Secondary | ICD-10-CM | POA: Diagnosis not present

## 2023-10-25 DIAGNOSIS — Z79899 Other long term (current) drug therapy: Secondary | ICD-10-CM | POA: Insufficient documentation

## 2023-10-25 DIAGNOSIS — R112 Nausea with vomiting, unspecified: Secondary | ICD-10-CM | POA: Diagnosis present

## 2023-10-25 DIAGNOSIS — I10 Essential (primary) hypertension: Secondary | ICD-10-CM | POA: Insufficient documentation

## 2023-10-25 DIAGNOSIS — J449 Chronic obstructive pulmonary disease, unspecified: Secondary | ICD-10-CM | POA: Diagnosis not present

## 2023-10-25 DIAGNOSIS — Z7984 Long term (current) use of oral hypoglycemic drugs: Secondary | ICD-10-CM | POA: Diagnosis not present

## 2023-10-25 DIAGNOSIS — E119 Type 2 diabetes mellitus without complications: Secondary | ICD-10-CM | POA: Diagnosis not present

## 2023-10-25 DIAGNOSIS — F10139 Alcohol abuse with withdrawal, unspecified: Secondary | ICD-10-CM | POA: Insufficient documentation

## 2023-10-25 DIAGNOSIS — F10930 Alcohol use, unspecified with withdrawal, uncomplicated: Secondary | ICD-10-CM

## 2023-10-25 DIAGNOSIS — R110 Nausea: Secondary | ICD-10-CM

## 2023-10-25 LAB — COMPREHENSIVE METABOLIC PANEL WITH GFR
ALT: 19 U/L (ref 0–44)
AST: 29 U/L (ref 15–41)
Albumin: 2.9 g/dL — ABNORMAL LOW (ref 3.5–5.0)
Alkaline Phosphatase: 96 U/L (ref 38–126)
Anion gap: 9 (ref 5–15)
BUN: <5 mg/dL — ABNORMAL LOW (ref 6–20)
CO2: 24 mmol/L (ref 22–32)
Calcium: 7.9 mg/dL — ABNORMAL LOW (ref 8.9–10.3)
Chloride: 108 mmol/L (ref 98–111)
Creatinine, Ser: 0.58 mg/dL (ref 0.44–1.00)
GFR, Estimated: >60 mL/min (ref >60)
Glucose, Bld: 161 mg/dL — ABNORMAL HIGH (ref 70–99)
Potassium: 4.1 mmol/L (ref 3.5–5.1)
Sodium: 141 mmol/L (ref 135–145)
Total Bilirubin: 1 mg/dL (ref 0.0–1.2)
Total Protein: 6.4 g/dL — ABNORMAL LOW (ref 6.5–8.1)

## 2023-10-25 LAB — URINALYSIS, ROUTINE W REFLEX MICROSCOPIC
Bilirubin Urine: NEGATIVE
Glucose, UA: NEGATIVE mg/dL
Hgb urine dipstick: NEGATIVE
Ketones, ur: NEGATIVE mg/dL
Leukocytes,Ua: NEGATIVE
Nitrite: NEGATIVE
Protein, ur: NEGATIVE mg/dL
Specific Gravity, Urine: 1.012 (ref 1.005–1.030)
pH: 5 (ref 5.0–8.0)

## 2023-10-25 LAB — CBC
HCT: 26.8 % — ABNORMAL LOW (ref 36.0–46.0)
Hemoglobin: 7.5 g/dL — ABNORMAL LOW (ref 12.0–15.0)
MCH: 20.5 pg — ABNORMAL LOW (ref 26.0–34.0)
MCHC: 28 g/dL — ABNORMAL LOW (ref 30.0–36.0)
MCV: 73.4 fL — ABNORMAL LOW (ref 80.0–100.0)
Platelets: 157 10*3/uL (ref 150–400)
RBC: 3.65 MIL/uL — ABNORMAL LOW (ref 3.87–5.11)
RDW: 21.5 % — ABNORMAL HIGH (ref 11.5–15.5)
WBC: 8.1 10*3/uL (ref 4.0–10.5)
nRBC: 0 % (ref 0.0–0.2)

## 2023-10-25 LAB — LIPASE, BLOOD: Lipase: 50 U/L (ref 11–51)

## 2023-10-25 MED ORDER — LORAZEPAM 2 MG/ML IJ SOLN
2.0000 mg | Freq: Once | INTRAMUSCULAR | Status: AC
  Administered 2023-10-25: 2 mg via INTRAVENOUS
  Filled 2023-10-25: qty 1

## 2023-10-25 MED ORDER — SODIUM CHLORIDE 0.9 % IV BOLUS
1000.0000 mL | Freq: Once | INTRAVENOUS | Status: AC
  Administered 2023-10-25: 1000 mL via INTRAVENOUS

## 2023-10-25 MED ORDER — ALUM & MAG HYDROXIDE-SIMETH 200-200-20 MG/5ML PO SUSP
30.0000 mL | Freq: Once | ORAL | Status: AC
  Administered 2023-10-25: 30 mL via ORAL
  Filled 2023-10-25: qty 30

## 2023-10-25 MED ORDER — LORAZEPAM 1 MG PO TABS
2.0000 mg | ORAL_TABLET | Freq: Once | ORAL | Status: AC
  Administered 2023-10-25: 2 mg via ORAL
  Filled 2023-10-25: qty 2

## 2023-10-25 MED ORDER — ONDANSETRON 4 MG PO TBDP
4.0000 mg | ORAL_TABLET | Freq: Once | ORAL | Status: AC
  Administered 2023-10-25: 4 mg via ORAL
  Filled 2023-10-25: qty 1

## 2023-10-25 MED ORDER — LIDOCAINE VISCOUS HCL 2 % MT SOLN
15.0000 mL | Freq: Once | OROMUCOSAL | Status: AC
  Administered 2023-10-25: 15 mL via ORAL
  Filled 2023-10-25: qty 15

## 2023-10-25 NOTE — ED Notes (Signed)
 EDP attempted to start an US  line w/o success.  IV consult placed.

## 2023-10-25 NOTE — ED Triage Notes (Signed)
 Pt BIB [facility] from home. She endorses N/V/D since around 2000 last night. 8/10 abdominal pain. Pt dry heaving during triage process.  EMS 115/81 BP 108P 97% RA

## 2023-10-25 NOTE — ED Notes (Signed)
 Pt given a sandwich bag, saltines x2, and a ginger ale.

## 2023-10-25 NOTE — ED Notes (Signed)
 Pt verbalized understanding of discharge instructions. Opportunity for questions provided.

## 2023-10-25 NOTE — Discharge Instructions (Addendum)
 Please stop drinking alcohol , please reach out to the resources I provided above.  Use your home Zofran  as needed for any ongoing nausea, vomiting.

## 2023-10-25 NOTE — ED Notes (Signed)
Pt given ginger ale and saltine crackers.  

## 2023-10-25 NOTE — ED Provider Notes (Signed)
 [place] EMERGENCY DEPARTMENT AT [facility] Provider Note   CSN: [id number] Arrival date & time: 10/25/23  4098     History  Chief Complaint  Patient presents with   Emesis   Nausea    [name] is a 58 y.o. female with past medical history significant for alcohol  use disorder, COPD, hypertension, diabetes, schizoaffective disorder, polysubstance abuse who is a frequent utilizer of the emergency department who presents with concern for nausea, vomiting, diarrhea since around 8 PM last night.  She endorses 8/10 abdominal pain.  Dry heaving at time of arrival.  She is tremulous, reports last drink may have been yesterday but she is not sure.  She thinks that she could going through withdrawals.  Similar presentation to when I saw her 2 days ago.   Emesis      Home Medications Prior to Admission medications   Medication Sig Start Date End Date Taking? Authorizing Provider  albuterol  (VENTOLIN  HFA) 108 (90 Base) MCG/ACT inhaler Inhale 1-2 puffs into the lungs every 6 (six) hours as needed for wheezing or shortness of breath. Patient not taking: Reported on 09/15/2023 07/15/23   Pollina, Christopher J, MD  budesonide -formoterol  (SYMBICORT ) 160-4.5 MCG/ACT inhaler Inhale 2 puffs into the lungs 2 (two) times daily. Patient not taking: Reported on 09/15/2023 02/25/23   Edson Graces, MD  cefdinir  (OMNICEF ) 300 MG capsule Take 1 capsule (300 mg total) by mouth 2 (two) times daily. Patient not taking: Reported on 09/15/2023 07/16/23   Kelsey Patricia, MD  doxycycline  (VIBRAMYCIN ) 100 MG capsule Take 1 capsule (100 mg total) by mouth 2 (two) times daily. 09/28/23   Felicie Horning, PA-C  famotidine  (PEPCID ) 20 MG tablet Take 1 tablet (20 mg total) by mouth daily. Patient not taking: Reported on 05/02/2023 02/25/23   Edson Graces, MD  ferrous sulfate  325 (65 FE) MG tablet Take 1 tablet (325 mg total) by mouth every other day. Patient not taking: Reported on 05/02/2023 03/18/23  10/02/23  Etter Hermann., MD  fluticasone -salmeterol (ADVAIR) 250-50 MCG/ACT AEPB Inhale 1 puff into the lungs in the morning and at bedtime. Patient not taking: Reported on 09/15/2023    [provider]  furosemide  (LASIX ) 20 MG tablet Take 1 tablet (20 mg total) by mouth daily. Patient not taking: Reported on 05/02/2023 02/24/23   Spence Dux, PA-C  gabapentin  (NEURONTIN ) 300 MG capsule Take 1 capsule (300 mg total) by mouth 3 (three) times daily. 05/01/23   Juliana Ocean, DO  hyoscyamine  (LEVSIN/SL) 0.125 MG SL tablet Place 1 tablet (0.125 mg total) under the tongue every 4 (four) hours as needed (pain). Up to 1.25 mg daily Patient not taking: Reported on 05/02/2023 01/23/23   Harris, Abigail, PA-C  ipratropium-albuterol  (DUONEB) 0.5-2.5 (3) MG/3ML SOLN Take 3 mLs by nebulization every 6 (six) hours as needed (for shortness of breath or wheezing). Patient not taking: Reported on 09/15/2023    [provider]  levETIRAcetam  (KEPPRA  XR) 500 MG 24 hr tablet Take 1 tablet (500 mg total) by mouth 2 (two) times daily. Patient not taking: Reported on 09/15/2023 07/07/23   Mordecai Applebaum, MD  metFORMIN  (GLUCOPHAGE -XR) 750 MG 24 hr tablet Take 1 tablet (750 mg total) by mouth daily. 06/17/23 07/17/23  Onuoha, Josephine C, NP  nicotine  (NICODERM CQ  - DOSED IN MG/24 HOURS) 21 mg/24hr patch Place 1 patch (21 mg total) onto the skin daily. Patient not taking: Reported on 09/15/2023 06/16/23   Onuoha, Josephine C, NP  nicotine  polacrilex (NICORETTE ) 2 MG gum Take 1 each (2 mg total) by mouth as needed for smoking cessation. Patient not taking: Reported on 09/15/2023 06/16/23   Onuoha, Josephine C, NP  ondansetron  (ZOFRAN ) 4 MG tablet Take 1 tablet (4 mg total) by mouth every 6 (six) hours. Patient not taking: Reported on 05/02/2023 01/20/23   Romi Rathel H, PA-C  ondansetron  (ZOFRAN -ODT) 4 MG disintegrating tablet Take 1 tablet (4 mg total) by mouth every 8 (eight) hours as  needed. Patient not taking: Reported on 09/15/2023 07/16/23   Kelsey Patricia, MD  OXYGEN  Inhale 4-5 L/min into the lungs as needed (for COPD-related symptoms). Patient not taking: Reported on 09/15/2023    [provider]  pantoprazole  (PROTONIX ) 40 MG tablet Take 1 tablet (40 mg total) by mouth daily. Patient not taking: Reported on 06/12/2023 03/17/23 06/12/23  Etter Hermann., MD  predniSONE  (DELTASONE ) 10 MG tablet Take 4 tablets (40 mg total) by mouth daily. Patient not taking: Reported on 09/15/2023 07/16/23   Kelsey Patricia, MD  predniSONE  (STERAPRED UNI-PAK 21 TAB) 10 MG (21) TBPK tablet Take by mouth daily. Take 6 tabs by mouth daily  for 2 days, then 5 tabs for 2 days, then 4 tabs for 2 days, then 3 tabs for 2 days, 2 tabs for 2 days, then 1 tab by mouth daily for 2 days 09/28/23   Felicie Horning, PA-C  sucralfate  (CARAFATE ) 1 g tablet Take 1 tablet (1 g total) by mouth 4 (four) times daily -  with meals and at bedtime. Patient not taking: Reported on 05/02/2023 02/11/23   Horton, Vonzella Guernsey, MD  tiZANidine  (ZANAFLEX ) 2 MG tablet Take 2 mg by mouth at bedtime. Patient not taking: Reported on 05/02/2023 02/19/23   [provider]  zonisamide  (ZONEGRAN ) 100 MG capsule Take 2 capsules (200 mg total) by mouth daily. Patient not taking: Reported on 06/12/2023 01/12/23   Faith Homes, MD      Allergies    Iron  dextran, Aspirin , and Penicillins    Review of Systems   Review of Systems  Gastrointestinal:  Positive for vomiting.  All other systems reviewed and are negative.   Physical Exam Updated Vital Signs BP (!) 166/139   Pulse (!) 117   Temp 98.1 F (36.7 C) (Oral)   Resp (!) 42   Ht 5\' 2"  (1.575 m)   Wt 60 kg   LMP 01/08/2011   SpO2 96%   BMI 24.19 kg/m  Physical Exam Vitals and nursing note reviewed.  Constitutional:      General: She is not in acute distress.    Appearance: Normal appearance.  HENT:     Head: Normocephalic and atraumatic.      Mouth/Throat:     Mouth: Mucous membranes are dry.  Eyes:     General:        Right eye: No discharge.        Left eye: No discharge.  Cardiovascular:     Rate and Rhythm: Regular rhythm. Tachycardia present.     Heart sounds: No murmur heard.    No friction rub. No gallop.  Pulmonary:     Effort: Pulmonary effort is normal.     Breath sounds: Normal breath sounds.     Comments: Initially tachypneic but no wheezing, rhonchi, stridor, rales on exam. Abdominal:     General: Bowel sounds are normal.     Palpations: Abdomen is soft.     Comments: Diffusely tender without rebound, rigidity, guarding,  normal bowel sounds  Skin:    General: Skin is warm and dry.     Capillary Refill: Capillary refill takes less than 2 seconds.  Neurological:     Mental Status: She is alert and oriented to person, place, and time.  Psychiatric:        Mood and Affect: Mood normal.        Behavior: Behavior normal.     ED Results / Procedures / Treatments   Labs (all labs ordered are listed, but only abnormal results are displayed) Labs Reviewed  CBC - Abnormal; Notable for the following components:      Result Value   RBC 3.65 (*)    Hemoglobin 7.5 (*)    HCT 26.8 (*)    MCV 73.4 (*)    MCH 20.5 (*)    MCHC 28.0 (*)    RDW 21.5 (*)    All other components within normal limits  COMPREHENSIVE METABOLIC PANEL WITH GFR - Abnormal; Notable for the following components:   Glucose, Bld 161 (*)    BUN <5 (*)    Calcium  7.9 (*)    Total Protein 6.4 (*)    Albumin  2.9 (*)    All other components within normal limits  URINALYSIS, ROUTINE W REFLEX MICROSCOPIC - Abnormal; Notable for the following components:   APPearance HAZY (*)    All other components within normal limits  LIPASE, BLOOD    EKG None  Radiology No results found.  Procedures Procedures    Medications Ordered in ED Medications  ondansetron  (ZOFRAN -ODT) disintegrating tablet 4 mg (4 mg Oral Given 10/25/23 0722)  sodium  chloride 0.9 % bolus 1,000 mL (0 mLs Intravenous Stopped 10/25/23 0846)  LORazepam  (ATIVAN ) tablet 2 mg (2 mg Oral Given 10/25/23 0733)  alum & mag hydroxide-simeth (MAALOX/MYLANTA) 200-200-20 MG/5ML suspension 30 mL (30 mLs Oral Given 10/25/23 0914)    And  lidocaine  (XYLOCAINE ) 2 % viscous mouth solution 15 mL (15 mLs Oral Given 10/25/23 0914)  LORazepam  (ATIVAN ) injection 2 mg (2 mg Intravenous Given 10/25/23 4696)    ED Course/ Medical Decision Making/ A&P                                 Medical Decision Making  This patient is a 58 y.o. female  who presents to the ED for concern of NVD, abdominal pain, tremors.   Differential diagnoses prior to evaluation: The emergent differential diagnosis includes, but is not limited to,  The causes of generalized abdominal pain include but are not limited to AAA, mesenteric ischemia, appendicitis, diverticulitis, DKA, gastritis, gastroenteritis, AMI, nephrolithiasis, pancreatitis, peritonitis, adrenal insufficiency,lead poisoning, iron  toxicity, intestinal ischemia, constipation, UTI,SBO/LBO, splenic rupture, biliary disease, IBD, IBS, PUD, or hepatitis--most suspicious of gastritis, or other complication of her chronic alcohol  use, with some evidence of developing withdrawals given last drink was yesterday or prior, she has some tremors on exam.. This is not an exhaustive differential.   Past Medical History / Co-morbidities / Social History: alcohol  use disorder, COPD, hypertension, diabetes, schizoaffective disorder, polysubstance abuse  Additional history: Chart reviewed. Pertinent results include: Reviewed lab work, imaging from her recent emergency department visits.  Physical Exam: Physical exam performed. The pertinent findings include: Patient with some tremors on arrival, she does have some tachycardia, pulse around 105, initially with normal blood pressure 110/76, developing some mild hypertension on recheck 141/78.  Signs and symptoms consistent  with developing withdrawals, as  well as dehydration in context of her chronic alcohol  abuse, frequent nausea, vomiting,  Lab Tests/Imaging studies: I personally interpreted labs/imaging and the pertinent results include: CBC with no leukocytosis, hemoglobin at 7.5 is fairly stable compared to patient's baseline with some fluctuation between visits within around 1 point range.  UA unremarkable.  CMP, lipase overall unremarkable.   Medications: I ordered medication including Ativan , Zofran , fluid bolus, GI cocktail..  I have reviewed the patients home medicines and have made adjustments as needed.  On reassessment vital signs improved, nausea improved, patient feeling better and requesting discharge.  Patient stable for discharge at this time.   Disposition: After consideration of the diagnostic results and the patients response to treatment, I feel that patient stable for discharge with plan as above .   emergency department workup does not suggest an emergent condition requiring admission or immediate intervention beyond what has been performed at this time. The plan is: as above. The patient is safe for discharge and has been instructed to return immediately for worsening symptoms, change in symptoms or any other concerns.  Final Clinical Impression(s) / ED Diagnoses Final diagnoses:  Dehydration  Nausea and vomiting, unspecified vomiting type  Nausea  Alcohol  withdrawal, uncomplicated [facility])    Rx / DC Orders ED Discharge Orders     None         Stefan Edge 10/25/23 1006    Kelsey Patricia, MD 10/26/23 [phone]

## 2023-10-25 NOTE — ED Notes (Signed)
 Pt seems to be tolerating crackers and water /Ginger Ale well.

## 2023-10-28 ENCOUNTER — Encounter (HOSPITAL_COMMUNITY): Payer: Self-pay

## 2023-10-28 ENCOUNTER — Emergency Department (HOSPITAL_COMMUNITY)
Admission: EM | Admit: 2023-10-28 | Discharge: 2023-10-28 | Disposition: A | Discharge status: Home / Self Care | Source: Home / Self Care | Attending: Emergency Medicine | Admitting: Emergency Medicine

## 2023-10-28 ENCOUNTER — Other Ambulatory Visit: Payer: Self-pay

## 2023-10-28 ENCOUNTER — Emergency Department (HOSPITAL_COMMUNITY)

## 2023-10-28 DIAGNOSIS — Z7984 Long term (current) use of oral hypoglycemic drugs: Secondary | ICD-10-CM | POA: Insufficient documentation

## 2023-10-28 DIAGNOSIS — J449 Chronic obstructive pulmonary disease, unspecified: Secondary | ICD-10-CM | POA: Insufficient documentation

## 2023-10-28 DIAGNOSIS — Z7951 Long term (current) use of inhaled steroids: Secondary | ICD-10-CM | POA: Insufficient documentation

## 2023-10-28 DIAGNOSIS — Y900 Blood alcohol level of less than 20 mg/100 ml: Secondary | ICD-10-CM | POA: Insufficient documentation

## 2023-10-28 DIAGNOSIS — R000 Tachycardia, unspecified: Secondary | ICD-10-CM | POA: Insufficient documentation

## 2023-10-28 DIAGNOSIS — R112 Nausea with vomiting, unspecified: Secondary | ICD-10-CM | POA: Insufficient documentation

## 2023-10-28 DIAGNOSIS — I10 Essential (primary) hypertension: Secondary | ICD-10-CM | POA: Insufficient documentation

## 2023-10-28 DIAGNOSIS — E119 Type 2 diabetes mellitus without complications: Secondary | ICD-10-CM | POA: Insufficient documentation

## 2023-10-28 DIAGNOSIS — Z7952 Long term (current) use of systemic steroids: Secondary | ICD-10-CM | POA: Insufficient documentation

## 2023-10-28 DIAGNOSIS — R0602 Shortness of breath: Secondary | ICD-10-CM | POA: Insufficient documentation

## 2023-10-28 DIAGNOSIS — Z79899 Other long term (current) drug therapy: Secondary | ICD-10-CM | POA: Insufficient documentation

## 2023-10-28 DIAGNOSIS — G9341 Metabolic encephalopathy: Secondary | ICD-10-CM | POA: Diagnosis not present

## 2023-10-28 DIAGNOSIS — R410 Disorientation, unspecified: Secondary | ICD-10-CM | POA: Diagnosis not present

## 2023-10-28 LAB — COMPREHENSIVE METABOLIC PANEL WITH GFR
ALT: 17 U/L (ref 0–44)
AST: 26 U/L (ref 15–41)
Albumin: 2.7 g/dL — ABNORMAL LOW (ref 3.5–5.0)
Alkaline Phosphatase: 93 U/L (ref 38–126)
Anion gap: 13 (ref 5–15)
BUN: <5 mg/dL — ABNORMAL LOW (ref 6–20)
CO2: 23 mmol/L (ref 22–32)
Calcium: 8.1 mg/dL — ABNORMAL LOW (ref 8.9–10.3)
Chloride: 100 mmol/L (ref 98–111)
Creatinine, Ser: 0.56 mg/dL (ref 0.44–1.00)
GFR, Estimated: >60 mL/min (ref >60)
Glucose, Bld: 204 mg/dL — ABNORMAL HIGH (ref 70–99)
Potassium: 3.7 mmol/L (ref 3.5–5.1)
Sodium: 136 mmol/L (ref 135–145)
Total Bilirubin: 0.6 mg/dL (ref 0.0–1.2)
Total Protein: 6.1 g/dL — ABNORMAL LOW (ref 6.5–8.1)

## 2023-10-28 LAB — ETHANOL: Alcohol, Ethyl (B): <15 mg/dL (ref <15)

## 2023-10-28 LAB — CBC WITH DIFFERENTIAL/PLATELET
Abs Immature Granulocytes: 0.04 10*3/uL (ref 0.00–0.07)
Basophils Absolute: 0 10*3/uL (ref 0.0–0.1)
Basophils Relative: 0 %
Eosinophils Absolute: 0 10*3/uL (ref 0.0–0.5)
Eosinophils Relative: 0 %
HCT: 25.2 % — ABNORMAL LOW (ref 36.0–46.0)
Hemoglobin: 7.1 g/dL — ABNORMAL LOW (ref 12.0–15.0)
Immature Granulocytes: 1 %
Lymphocytes Relative: 14 %
Lymphs Abs: 1.1 10*3/uL (ref 0.7–4.0)
MCH: 20.4 pg — ABNORMAL LOW (ref 26.0–34.0)
MCHC: 28.2 g/dL — ABNORMAL LOW (ref 30.0–36.0)
MCV: 72.4 fL — ABNORMAL LOW (ref 80.0–100.0)
Monocytes Absolute: 0.4 10*3/uL (ref 0.1–1.0)
Monocytes Relative: 5 %
Neutro Abs: 6 10*3/uL (ref 1.7–7.7)
Neutrophils Relative %: 80 %
Platelets: 171 10*3/uL (ref 150–400)
RBC: 3.48 MIL/uL — ABNORMAL LOW (ref 3.87–5.11)
RDW: 21.4 % — ABNORMAL HIGH (ref 11.5–15.5)
Smear Review: NORMAL
WBC: 7.6 10*3/uL (ref 4.0–10.5)
nRBC: 0 % (ref 0.0–0.2)

## 2023-10-28 LAB — LIPASE, BLOOD: Lipase: 48 U/L (ref 11–51)

## 2023-10-28 LAB — TROPONIN I (HIGH SENSITIVITY): Troponin I (High Sensitivity): 5 ng/L (ref <18)

## 2023-10-28 MED ORDER — IPRATROPIUM-ALBUTEROL 0.5-2.5 (3) MG/3ML IN SOLN
3.0000 mL | Freq: Once | RESPIRATORY_TRACT | Status: AC
  Administered 2023-10-28: 3 mL via RESPIRATORY_TRACT
  Filled 2023-10-28: qty 3

## 2023-10-28 NOTE — ED Provider Notes (Signed)
 [place] EMERGENCY DEPARTMENT AT [facility] Provider Note   CSN: [id number] Arrival date & time: 10/28/23  0143     History  Chief Complaint  Patient presents with   Shortness of Breath    [name] is a 58 y.o. female.  The history is provided by the patient and medical records.  Shortness of Breath  58 year old female with history of alcohol  abuse, acute on chronic respiratory failure, chronic pain, COPD, hypertension, GERD, schizoaffective disorder, diabetes, presenting to the ED with shortness of breath.  Patient states she woke up about an hour ago and felt like she could not breathe.  She does admit she did not take any of her medications yesterday as "there are too many and I am not sure which ones".  She was given 2 DuoNeb's and Solu-Medrol  with EMS.  She is on chronic O2, usually 4-5L.  Also states some nausea/vomiting.  Ongoing alcohol  abuse-- states she was drinking last night but not early this morning.  Home Medications Prior to Admission medications   Medication Sig Start Date End Date Taking? Authorizing Provider  albuterol  (VENTOLIN  HFA) 108 (90 Base) MCG/ACT inhaler Inhale 1-2 puffs into the lungs every 6 (six) hours as needed for wheezing or shortness of breath. Patient not taking: Reported on 09/15/2023 07/15/23   Pollina, Christopher J, MD  budesonide -formoterol  (SYMBICORT ) 160-4.5 MCG/ACT inhaler Inhale 2 puffs into the lungs 2 (two) times daily. Patient not taking: Reported on 09/15/2023 02/25/23   Edson Graces, MD  cefdinir  (OMNICEF ) 300 MG capsule Take 1 capsule (300 mg total) by mouth 2 (two) times daily. Patient not taking: Reported on 09/15/2023 07/16/23   Kelsey Patricia, MD  doxycycline  (VIBRAMYCIN ) 100 MG capsule Take 1 capsule (100 mg total) by mouth 2 (two) times daily. 09/28/23   Felicie Horning, PA-C  famotidine  (PEPCID ) 20 MG tablet Take 1 tablet (20 mg total) by mouth daily. Patient not taking: Reported on 05/02/2023 02/25/23   Edson Graces, MD  ferrous sulfate  325 (65 FE) MG tablet Take 1 tablet (325 mg total) by mouth every other day. Patient not taking: Reported on 05/02/2023 03/18/23 10/02/23  Etter Hermann., MD  fluticasone -salmeterol (ADVAIR) 250-50 MCG/ACT AEPB Inhale 1 puff into the lungs in the morning and at bedtime. Patient not taking: Reported on 09/15/2023    [provider]  furosemide  (LASIX ) 20 MG tablet Take 1 tablet (20 mg total) by mouth daily. Patient not taking: Reported on 05/02/2023 02/24/23   Spence Dux, PA-C  gabapentin  (NEURONTIN ) 300 MG capsule Take 1 capsule (300 mg total) by mouth 3 (three) times daily. 05/01/23   Juliana Ocean, DO  hyoscyamine  (LEVSIN/SL) 0.125 MG SL tablet Place 1 tablet (0.125 mg total) under the tongue every 4 (four) hours as needed (pain). Up to 1.25 mg daily Patient not taking: Reported on 05/02/2023 01/23/23   Tama Fails, PA-C  ipratropium-albuterol  (DUONEB) 0.5-2.5 (3) MG/3ML SOLN Take 3 mLs by nebulization every 6 (six) hours as needed (for shortness of breath or wheezing). Patient not taking: Reported on 09/15/2023    [provider]  levETIRAcetam  (KEPPRA  XR) 500 MG 24 hr tablet Take 1 tablet (500 mg total) by mouth 2 (two) times daily. Patient not taking: Reported on 09/15/2023 07/07/23   Mordecai Applebaum, MD  metFORMIN  (GLUCOPHAGE -XR) 750 MG 24 hr tablet Take 1 tablet (750 mg total) by mouth daily. 06/17/23 07/17/23  Onuoha, Josephine C, NP  nicotine  (NICODERM CQ  - DOSED IN  MG/24 HOURS) 21 mg/24hr patch Place 1 patch (21 mg total) onto the skin daily. Patient not taking: Reported on 09/15/2023 06/16/23   Onuoha, Josephine C, NP  nicotine  polacrilex (NICORETTE ) 2 MG gum Take 1 each (2 mg total) by mouth as needed for smoking cessation. Patient not taking: Reported on 09/15/2023 06/16/23   Onuoha, Josephine C, NP  ondansetron  (ZOFRAN ) 4 MG tablet Take 1 tablet (4 mg total) by mouth every 6 (six) hours. Patient not taking: Reported on  05/02/2023 01/20/23   Prosperi, Christian H, PA-C  ondansetron  (ZOFRAN -ODT) 4 MG disintegrating tablet Take 1 tablet (4 mg total) by mouth every 8 (eight) hours as needed. Patient not taking: Reported on 09/15/2023 07/16/23   Kelsey Patricia, MD  OXYGEN  Inhale 4-5 L/min into the lungs as needed (for COPD-related symptoms). Patient not taking: Reported on 09/15/2023    [provider]  pantoprazole  (PROTONIX ) 40 MG tablet Take 1 tablet (40 mg total) by mouth daily. Patient not taking: Reported on 06/12/2023 03/17/23 06/12/23  Etter Hermann., MD  predniSONE  (DELTASONE ) 10 MG tablet Take 4 tablets (40 mg total) by mouth daily. Patient not taking: Reported on 09/15/2023 07/16/23   Kelsey Patricia, MD  predniSONE  (STERAPRED UNI-PAK 21 TAB) 10 MG (21) TBPK tablet Take by mouth daily. Take 6 tabs by mouth daily  for 2 days, then 5 tabs for 2 days, then 4 tabs for 2 days, then 3 tabs for 2 days, 2 tabs for 2 days, then 1 tab by mouth daily for 2 days 09/28/23   Felicie Horning, PA-C  sucralfate  (CARAFATE ) 1 g tablet Take 1 tablet (1 g total) by mouth 4 (four) times daily -  with meals and at bedtime. Patient not taking: Reported on 05/02/2023 02/11/23   Horton, Vonzella Guernsey, MD  tiZANidine  (ZANAFLEX ) 2 MG tablet Take 2 mg by mouth at bedtime. Patient not taking: Reported on 05/02/2023 02/19/23   [provider]  zonisamide  (ZONEGRAN ) 100 MG capsule Take 2 capsules (200 mg total) by mouth daily. Patient not taking: Reported on 06/12/2023 01/12/23   Faith Homes, MD      Allergies    Iron  dextran, Aspirin , and Penicillins    Review of Systems   Review of Systems  Respiratory:  Positive for shortness of breath.   All other systems reviewed and are negative.   Physical Exam Updated Vital Signs BP 135/71 (BP Location: Right Arm)   Pulse (!) 112   Temp 98.2 F (36.8 C) (Oral)   Resp (!) 22   Ht 5\' 2"  (1.575 m)   Wt 60 kg   LMP 01/08/2011   SpO2 100%   BMI 24.19 kg/m    Physical Exam Vitals and nursing note reviewed.  Constitutional:      Appearance: She is well-developed.  HENT:     Head: Normocephalic and atraumatic.  Eyes:     Conjunctiva/sclera: Conjunctivae normal.     Pupils: Pupils are equal, round, and reactive to light.  Cardiovascular:     Rate and Rhythm: Regular rhythm. Tachycardia present.     Heart sounds: Normal heart sounds.     Comments: Tachy 120's Pulmonary:     Effort: Pulmonary effort is normal.     Breath sounds: Wheezing present.     Comments: Faint expiratory wheezes, tachypneic, on 6L via venti mask, sats 100% Abdominal:     General: Bowel sounds are normal.     Palpations: Abdomen is soft.     Tenderness: There is  no abdominal tenderness. There is no rebound.     Comments: Soft, non-tender  Musculoskeletal:        General: Normal range of motion.     Cervical back: Normal range of motion.  Skin:    General: Skin is warm and dry.  Neurological:     Mental Status: She is alert and oriented to person, place, and time.     ED Results / Procedures / Treatments   Labs (all labs ordered are listed, but only abnormal results are displayed) Labs Reviewed  CBC WITH DIFFERENTIAL/PLATELET - Abnormal; Notable for the following components:      Result Value   RBC 3.48 (*)    Hemoglobin 7.1 (*)    HCT 25.2 (*)    MCV 72.4 (*)    MCH 20.4 (*)    MCHC 28.2 (*)    RDW 21.4 (*)    All other components within normal limits  COMPREHENSIVE METABOLIC PANEL WITH GFR - Abnormal; Notable for the following components:   Glucose, Bld 204 (*)    BUN <5 (*)    Calcium  8.1 (*)    Total Protein 6.1 (*)    Albumin  2.7 (*)    All other components within normal limits  LIPASE, BLOOD  ETHANOL  TROPONIN I (HIGH SENSITIVITY)    EKG EKG Interpretation Date/Time:  Wednesday Oct 28 2023 02:41:14 EDT Ventricular Rate:  108 PR Interval:  125 QRS Duration:  90 QT Interval:  332 QTC Calculation: 445 R Axis:   43  Text  Interpretation: Sinus tachycardia Confirmed by Palumbo, April ([id number]) on 10/28/2023 4:59:56 AM  Radiology DG Chest Port 1 View Result Date: 10/28/2023 CLINICAL DATA:  Shortness of breath EXAM: PORTABLE CHEST 1 VIEW COMPARISON:  09/28/2023 FINDINGS: Stable cardiomediastinal silhouette. No focal consolidation, pleural effusion, or pneumothorax. No displaced rib fractures. IMPRESSION: No active disease. Electronically Signed   By: Rozell Cornet M.D.   On: 10/28/2023 02:27    Procedures Procedures    Medications Ordered in ED Medications  ipratropium-albuterol  (DUONEB) 0.5-2.5 (3) MG/3ML nebulizer solution 3 mL (3 mLs Nebulization Given 10/28/23 0242)    ED Course/ Medical Decision Making/ A&P                                 Medical Decision Making Amount and/or Complexity of Data Reviewed Labs: ordered. Radiology: ordered and independent interpretation performed. ECG/medicine tests: ordered and independent interpretation performed.  Risk Prescription drug management.   58 y.o. F here with SOB.  Hx of COPD, well known for same.  She is tachycardic and tachypneic on arrival.  Some faint wheezes but does not appear distress.  Also reports N/V.  Seen for same yesterday.  Already had solumedrol and nebs x2 PTA.  Will repeat labs, CXR.  Given additional neb here.  Labs as above--no cyst.  Hemoglobin 7.1, similar to prior.  No electrolyte derangement.  Normal lipase.  Troponin is negative.  Ethanol is negative today as well.  Chest x-ray is clear.  4:50 AM Patient has remained stable here.  She states her breathing feels better. She has been up and ambulatory in room and halls without her O2, maintaining without issue.  Work-up today is reassuring.  Appears stable for discharge.  Encouraged to take home meds/inhalers.  Follow-up with PCP.  Return here for new concerns.  Final Clinical Impression(s) / ED Diagnoses Final diagnoses:  Shortness of breath  Rx / DC Orders ED Discharge  Orders     None         Coretha Dew, PA-C 10/28/23 0544    Maralee Senate, April, MD 10/28/23 [phone]

## 2023-10-28 NOTE — ED Notes (Signed)
 Urine sample sent to lab...KM

## 2023-10-28 NOTE — Discharge Instructions (Addendum)
Continue your home medications. Follow-up with your doctor. Return here for new concerns.

## 2023-10-28 NOTE — ED Triage Notes (Signed)
 Patient comes in from home via EMS with complaints of shortness of breath that began 1 hour ago. Patient also complained of abdominal pain and nausea. EMS gave 2 duoneb, 125 mg of solumedrol, and 4 mg of zofran . Pmh- COPD, asthma

## 2023-10-29 ENCOUNTER — Encounter (HOSPITAL_COMMUNITY): Payer: Self-pay

## 2023-10-29 ENCOUNTER — Emergency Department (HOSPITAL_COMMUNITY)

## 2023-10-29 ENCOUNTER — Other Ambulatory Visit: Payer: Self-pay

## 2023-10-29 ENCOUNTER — Inpatient Hospital Stay (HOSPITAL_COMMUNITY)
Admission: EM | Admit: 2023-10-29 | Discharge: 2023-10-31 | DRG: 071 | Disposition: A | Discharge status: Home / Self Care | Attending: Family Medicine | Admitting: Family Medicine

## 2023-10-29 ENCOUNTER — Emergency Department (HOSPITAL_COMMUNITY)
Admission: EM | Admit: 2023-10-29 | Discharge: 2023-10-29 | Disposition: A | Discharge status: Home / Self Care | Source: Home / Self Care | Attending: Emergency Medicine | Admitting: Emergency Medicine

## 2023-10-29 DIAGNOSIS — W19XXXA Unspecified fall, initial encounter: Principal | ICD-10-CM

## 2023-10-29 DIAGNOSIS — I4891 Unspecified atrial fibrillation: Secondary | ICD-10-CM | POA: Diagnosis present

## 2023-10-29 DIAGNOSIS — J441 Chronic obstructive pulmonary disease with (acute) exacerbation: Secondary | ICD-10-CM

## 2023-10-29 DIAGNOSIS — D649 Anemia, unspecified: Secondary | ICD-10-CM

## 2023-10-29 DIAGNOSIS — F419 Anxiety disorder, unspecified: Secondary | ICD-10-CM | POA: Diagnosis present

## 2023-10-29 DIAGNOSIS — E1165 Type 2 diabetes mellitus with hyperglycemia: Secondary | ICD-10-CM | POA: Diagnosis present

## 2023-10-29 DIAGNOSIS — F1010 Alcohol abuse, uncomplicated: Secondary | ICD-10-CM | POA: Diagnosis present

## 2023-10-29 DIAGNOSIS — F1990 Other psychoactive substance use, unspecified, uncomplicated: Secondary | ICD-10-CM | POA: Insufficient documentation

## 2023-10-29 DIAGNOSIS — Z96641 Presence of right artificial hip joint: Secondary | ICD-10-CM | POA: Diagnosis present

## 2023-10-29 DIAGNOSIS — F17210 Nicotine dependence, cigarettes, uncomplicated: Secondary | ICD-10-CM | POA: Diagnosis present

## 2023-10-29 DIAGNOSIS — K219 Gastro-esophageal reflux disease without esophagitis: Secondary | ICD-10-CM | POA: Diagnosis present

## 2023-10-29 DIAGNOSIS — G9341 Metabolic encephalopathy: Principal | ICD-10-CM | POA: Diagnosis present

## 2023-10-29 DIAGNOSIS — I10 Essential (primary) hypertension: Secondary | ICD-10-CM | POA: Diagnosis present

## 2023-10-29 DIAGNOSIS — E1140 Type 2 diabetes mellitus with diabetic neuropathy, unspecified: Secondary | ICD-10-CM | POA: Diagnosis present

## 2023-10-29 DIAGNOSIS — Z1152 Encounter for screening for COVID-19: Secondary | ICD-10-CM

## 2023-10-29 DIAGNOSIS — G9340 Encephalopathy, unspecified: Secondary | ICD-10-CM | POA: Diagnosis present

## 2023-10-29 DIAGNOSIS — R296 Repeated falls: Secondary | ICD-10-CM | POA: Diagnosis present

## 2023-10-29 DIAGNOSIS — R000 Tachycardia, unspecified: Secondary | ICD-10-CM

## 2023-10-29 DIAGNOSIS — F1910 Other psychoactive substance abuse, uncomplicated: Secondary | ICD-10-CM | POA: Diagnosis present

## 2023-10-29 DIAGNOSIS — E785 Hyperlipidemia, unspecified: Secondary | ICD-10-CM | POA: Diagnosis present

## 2023-10-29 DIAGNOSIS — Z833 Family history of diabetes mellitus: Secondary | ICD-10-CM

## 2023-10-29 DIAGNOSIS — Z91148 Patient's other noncompliance with medication regimen for other reason: Secondary | ICD-10-CM

## 2023-10-29 DIAGNOSIS — Z886 Allergy status to analgesic agent status: Secondary | ICD-10-CM

## 2023-10-29 DIAGNOSIS — Y900 Blood alcohol level of less than 20 mg/100 ml: Secondary | ICD-10-CM | POA: Diagnosis present

## 2023-10-29 DIAGNOSIS — E039 Hypothyroidism, unspecified: Secondary | ICD-10-CM | POA: Diagnosis present

## 2023-10-29 DIAGNOSIS — Z880 Allergy status to penicillin: Secondary | ICD-10-CM

## 2023-10-29 DIAGNOSIS — Z9884 Bariatric surgery status: Secondary | ICD-10-CM

## 2023-10-29 DIAGNOSIS — G40909 Epilepsy, unspecified, not intractable, without status epilepticus: Secondary | ICD-10-CM | POA: Diagnosis present

## 2023-10-29 DIAGNOSIS — W1830XA Fall on same level, unspecified, initial encounter: Secondary | ICD-10-CM | POA: Diagnosis present

## 2023-10-29 DIAGNOSIS — F250 Schizoaffective disorder, bipolar type: Secondary | ICD-10-CM | POA: Diagnosis present

## 2023-10-29 DIAGNOSIS — E860 Dehydration: Secondary | ICD-10-CM | POA: Diagnosis present

## 2023-10-29 DIAGNOSIS — R410 Disorientation, unspecified: Secondary | ICD-10-CM

## 2023-10-29 DIAGNOSIS — Z8249 Family history of ischemic heart disease and other diseases of the circulatory system: Secondary | ICD-10-CM

## 2023-10-29 DIAGNOSIS — D509 Iron deficiency anemia, unspecified: Secondary | ICD-10-CM | POA: Diagnosis present

## 2023-10-29 DIAGNOSIS — Z9981 Dependence on supplemental oxygen: Secondary | ICD-10-CM

## 2023-10-29 DIAGNOSIS — Z7951 Long term (current) use of inhaled steroids: Secondary | ICD-10-CM

## 2023-10-29 DIAGNOSIS — Z825 Family history of asthma and other chronic lower respiratory diseases: Secondary | ICD-10-CM

## 2023-10-29 DIAGNOSIS — E538 Deficiency of other specified B group vitamins: Secondary | ICD-10-CM | POA: Diagnosis present

## 2023-10-29 LAB — URINALYSIS, ROUTINE W REFLEX MICROSCOPIC
Bilirubin Urine: NEGATIVE
Glucose, UA: NEGATIVE mg/dL
Hgb urine dipstick: NEGATIVE
Ketones, ur: NEGATIVE mg/dL
Leukocytes,Ua: NEGATIVE
Nitrite: NEGATIVE
Protein, ur: NEGATIVE mg/dL
Specific Gravity, Urine: 1.004 — ABNORMAL LOW (ref 1.005–1.030)
pH: 6 (ref 5.0–8.0)

## 2023-10-29 LAB — CBC
HCT: 26 % — ABNORMAL LOW (ref 36.0–46.0)
Hemoglobin: 7.5 g/dL — ABNORMAL LOW (ref 12.0–15.0)
MCH: 21 pg — ABNORMAL LOW (ref 26.0–34.0)
MCHC: 28.8 g/dL — ABNORMAL LOW (ref 30.0–36.0)
MCV: 72.8 fL — ABNORMAL LOW (ref 80.0–100.0)
Platelets: UNDETERMINED 10*3/uL (ref 150–400)
RBC: 3.57 MIL/uL — ABNORMAL LOW (ref 3.87–5.11)
RDW: 22.2 % — ABNORMAL HIGH (ref 11.5–15.5)
WBC: 6.4 10*3/uL (ref 4.0–10.5)
nRBC: 0 % (ref 0.0–0.2)

## 2023-10-29 LAB — COMPREHENSIVE METABOLIC PANEL WITH GFR
ALT: 23 U/L (ref 0–44)
AST: 36 U/L (ref 15–41)
Albumin: 3.1 g/dL — ABNORMAL LOW (ref 3.5–5.0)
Alkaline Phosphatase: 88 U/L (ref 38–126)
Anion gap: 14 (ref 5–15)
BUN: 7 mg/dL (ref 6–20)
CO2: 19 mmol/L — ABNORMAL LOW (ref 22–32)
Calcium: 8.3 mg/dL — ABNORMAL LOW (ref 8.9–10.3)
Chloride: 102 mmol/L (ref 98–111)
Creatinine, Ser: 0.71 mg/dL (ref 0.44–1.00)
GFR, Estimated: >60 mL/min (ref >60)
Glucose, Bld: 253 mg/dL — ABNORMAL HIGH (ref 70–99)
Potassium: 4.4 mmol/L (ref 3.5–5.1)
Sodium: 135 mmol/L (ref 135–145)
Total Bilirubin: 0.4 mg/dL (ref 0.0–1.2)
Total Protein: 7 g/dL (ref 6.5–8.1)

## 2023-10-29 LAB — BASIC METABOLIC PANEL WITH GFR
Anion gap: 10 (ref 5–15)
BUN: 7 mg/dL (ref 6–20)
CO2: 26 mmol/L (ref 22–32)
Calcium: 8.3 mg/dL — ABNORMAL LOW (ref 8.9–10.3)
Chloride: 103 mmol/L (ref 98–111)
Creatinine, Ser: 0.5 mg/dL (ref 0.44–1.00)
GFR, Estimated: >60 mL/min (ref >60)
Glucose, Bld: 141 mg/dL — ABNORMAL HIGH (ref 70–99)
Potassium: 4.2 mmol/L (ref 3.5–5.1)
Sodium: 139 mmol/L (ref 135–145)

## 2023-10-29 LAB — CBC WITH DIFFERENTIAL/PLATELET
Abs Immature Granulocytes: 0.04 10*3/uL (ref 0.00–0.07)
Basophils Absolute: 0 10*3/uL (ref 0.0–0.1)
Basophils Relative: 0 %
Eosinophils Absolute: 0 10*3/uL (ref 0.0–0.5)
Eosinophils Relative: 0 %
HCT: 26.3 % — ABNORMAL LOW (ref 36.0–46.0)
Hemoglobin: 7.4 g/dL — ABNORMAL LOW (ref 12.0–15.0)
Immature Granulocytes: 0 %
Lymphocytes Relative: 7 %
Lymphs Abs: 0.6 10*3/uL — ABNORMAL LOW (ref 0.7–4.0)
MCH: 20.4 pg — ABNORMAL LOW (ref 26.0–34.0)
MCHC: 28.1 g/dL — ABNORMAL LOW (ref 30.0–36.0)
MCV: 72.5 fL — ABNORMAL LOW (ref 80.0–100.0)
Monocytes Absolute: 0.4 10*3/uL (ref 0.1–1.0)
Monocytes Relative: 5 %
Neutro Abs: 7.9 10*3/uL — ABNORMAL HIGH (ref 1.7–7.7)
Neutrophils Relative %: 88 %
Platelets: 248 10*3/uL (ref 150–400)
RBC: 3.63 MIL/uL — ABNORMAL LOW (ref 3.87–5.11)
RDW: 21.3 % — ABNORMAL HIGH (ref 11.5–15.5)
Smear Review: ADEQUATE
WBC: 9 10*3/uL (ref 4.0–10.5)
nRBC: 0 % (ref 0.0–0.2)

## 2023-10-29 LAB — RESP PANEL BY RT-PCR (RSV, FLU A&B, COVID)  RVPGX2
Influenza A by PCR: NEGATIVE
Influenza B by PCR: NEGATIVE
Resp Syncytial Virus by PCR: NEGATIVE
SARS Coronavirus 2 by RT PCR: NEGATIVE

## 2023-10-29 LAB — LIPASE, BLOOD: Lipase: 42 U/L (ref 11–51)

## 2023-10-29 LAB — CBG MONITORING, ED: Glucose-Capillary: 268 mg/dL — ABNORMAL HIGH (ref 70–99)

## 2023-10-29 MED ORDER — LORAZEPAM 2 MG/ML IJ SOLN
0.0000 mg | Freq: Four times a day (QID) | INTRAMUSCULAR | Status: DC
  Administered 2023-10-29: 1 mg via INTRAVENOUS
  Filled 2023-10-29: qty 1

## 2023-10-29 MED ORDER — LORAZEPAM 1 MG PO TABS: 0.0000 mg | ORAL_TABLET | Freq: Four times a day (QID) | ORAL | Status: DC

## 2023-10-29 MED ORDER — PREDNISONE 20 MG PO TABS: ORAL_TABLET | ORAL | 0 refills | Status: DC

## 2023-10-29 MED ORDER — CARBAMAZEPINE 200 MG PO TABS: ORAL_TABLET | ORAL | 0 refills | Status: DC

## 2023-10-29 MED ORDER — HALOPERIDOL LACTATE 5 MG/ML IJ SOLN
2.0000 mg | Freq: Once | INTRAMUSCULAR | Status: AC
  Administered 2023-10-29: 2 mg via INTRAVENOUS
  Filled 2023-10-29: qty 1

## 2023-10-29 MED ORDER — ALBUTEROL SULFATE HFA 108 (90 BASE) MCG/ACT IN AERS: 1.0000 | INHALATION_SPRAY | Freq: Four times a day (QID) | RESPIRATORY_TRACT | 0 refills | Status: DC | PRN

## 2023-10-29 MED ORDER — IPRATROPIUM-ALBUTEROL 0.5-2.5 (3) MG/3ML IN SOLN
3.0000 mL | Freq: Once | RESPIRATORY_TRACT | Status: AC
  Administered 2023-10-29: 3 mL via RESPIRATORY_TRACT
  Filled 2023-10-29: qty 3

## 2023-10-29 MED ORDER — THIAMINE MONONITRATE 100 MG PO TABS
100.0000 mg | ORAL_TABLET | Freq: Every day | ORAL | Status: DC
  Filled 2023-10-29: qty 1

## 2023-10-29 MED ORDER — LORAZEPAM 1 MG PO TABS
1.0000 mg | ORAL_TABLET | Freq: Once | ORAL | Status: AC
Start: 2023-10-29 — End: 2023-10-29
  Administered 2023-10-29: 1 mg via ORAL
  Filled 2023-10-29: qty 1

## 2023-10-29 MED ORDER — OXYCODONE HCL 5 MG PO TABS
5.0000 mg | ORAL_TABLET | Freq: Once | ORAL | Status: AC
  Administered 2023-10-29: 5 mg via ORAL
  Filled 2023-10-29: qty 1

## 2023-10-29 MED ORDER — NICOTINE 21 MG/24HR TD PT24
21.0000 mg | MEDICATED_PATCH | Freq: Once | TRANSDERMAL | Status: AC
  Administered 2023-10-29: 21 mg via TRANSDERMAL
  Filled 2023-10-29: qty 1

## 2023-10-29 MED ORDER — IPRATROPIUM-ALBUTEROL 0.5-2.5 (3) MG/3ML IN SOLN
3.0000 mL | Freq: Once | RESPIRATORY_TRACT | Status: AC
Start: 2023-10-29 — End: 2023-10-29
  Administered 2023-10-29: 3 mL via RESPIRATORY_TRACT
  Filled 2023-10-29: qty 3

## 2023-10-29 MED ORDER — DEXAMETHASONE SODIUM PHOSPHATE 10 MG/ML IJ SOLN
10.0000 mg | Freq: Once | INTRAMUSCULAR | Status: AC
  Administered 2023-10-29: 10 mg via INTRAVENOUS
  Filled 2023-10-29: qty 1

## 2023-10-29 MED ORDER — LORAZEPAM 2 MG/ML IJ SOLN: 0.0000 mg | Freq: Two times a day (BID) | INTRAMUSCULAR | Status: DC

## 2023-10-29 MED ORDER — LORAZEPAM 1 MG PO TABS: 0.0000 mg | ORAL_TABLET | Freq: Two times a day (BID) | ORAL | Status: DC

## 2023-10-29 MED ORDER — ACETAMINOPHEN 500 MG PO TABS
1000.0000 mg | ORAL_TABLET | Freq: Once | ORAL | Status: AC
  Administered 2023-10-29: 1000 mg via ORAL
  Filled 2023-10-29: qty 2

## 2023-10-29 MED ORDER — THIAMINE HCL 100 MG/ML IJ SOLN
100.0000 mg | Freq: Every day | INTRAMUSCULAR | Status: DC
  Administered 2023-10-29: 100 mg via INTRAVENOUS
  Filled 2023-10-29: qty 2

## 2023-10-29 MED ORDER — IPRATROPIUM-ALBUTEROL 0.5-2.5 (3) MG/3ML IN SOLN
3.0000 mL | Freq: Once | RESPIRATORY_TRACT | Status: DC
  Filled 2023-10-29: qty 3

## 2023-10-29 MED ORDER — ONDANSETRON 4 MG PO TBDP
4.0000 mg | ORAL_TABLET | Freq: Once | ORAL | Status: AC
  Administered 2023-10-29: 4 mg via ORAL
  Filled 2023-10-29: qty 1

## 2023-10-29 MED ORDER — LACTATED RINGERS IV BOLUS
2000.0000 mL | Freq: Once | INTRAVENOUS | Status: AC
  Administered 2023-10-29: 1 mL via INTRAVENOUS

## 2023-10-29 NOTE — ED Triage Notes (Signed)
 Pt was seen and discharge today. Returns d/t concerns for alcohol  withdraw. Sts she drinks 2 40 oz beers every day - last drink last night 8 pm.

## 2023-10-29 NOTE — TOC CM/SW Note (Signed)
 SW met with bedside following up for discharge needs. SW also spoke with patient's daughter Lenora Radish via phone and provided her with update information regarding patient. Patient's daughter expressed concerns about patient experiencing multiple falls and new medication she was prescribed from [facility]. SW notified patients PA regarding concerns from patient's daughter and would like to be contacted.   .Sajid Ruppert, MSW, LCSWA Transition of Care  Clinical Social Worker (ED 3-11 Mon-Fri)  [phone]

## 2023-10-29 NOTE — ED Provider Notes (Signed)
 [place] EMERGENCY DEPARTMENT AT [facility] Provider Note   CSN: [id number] Arrival date & time: 10/29/23  1723     History  Chief Complaint  Patient presents with   Alcohol  Intoxication   Hyperglycemia    [name] is a 58 y.o. female.  58 year old female presents today for concern of alcohol  intoxication.  EMS called by her daughter.  Collateral later obtained by her daughter cherish.  She states that patient is a heavy drinker and was recently discharged with a medicine called carbamazepine .  After taking this medication patient has had 5 falls today.  She did have some alcoholic drinks today as well.  Main concern is the falls.  The history is provided by the patient. No language interpreter was used.       Home Medications Prior to Admission medications   Medication Sig Start Date End Date Taking? Authorizing Provider  albuterol  (VENTOLIN  HFA) 108 (90 Base) MCG/ACT inhaler Inhale 1-2 puffs into the lungs every 6 (six) hours as needed for wheezing or shortness of breath. Patient not taking: Reported on 09/15/2023 07/15/23   Ballard Bongo, MD  albuterol  (VENTOLIN  HFA) 108 (90 Base) MCG/ACT inhaler Inhale 1-2 puffs into the lungs every 6 (six) hours as needed for wheezing or shortness of breath. 10/29/23   Palumbo, April, MD  budesonide -formoterol  (SYMBICORT ) 160-4.5 MCG/ACT inhaler Inhale 2 puffs into the lungs 2 (two) times daily. Patient not taking: Reported on 09/15/2023 02/25/23   Edson Graces, MD  carbamazepine  (TEGRETOL ) 200 MG tablet 800mg  PO QD X 1D, then 600mg  PO QD X 1D, then 400mg  QD X 1D, then 200mg  PO QD X 2D 10/29/23   Palumbo, April, MD  cefdinir  (OMNICEF ) 300 MG capsule Take 1 capsule (300 mg total) by mouth 2 (two) times daily. Patient not taking: Reported on 09/15/2023 07/16/23   Kelsey Patricia, MD  doxycycline  (VIBRAMYCIN ) 100 MG capsule Take 1 capsule (100 mg total) by mouth 2 (two) times daily. 09/28/23   Felicie Horning, PA-C   famotidine  (PEPCID ) 20 MG tablet Take 1 tablet (20 mg total) by mouth daily. Patient not taking: Reported on 05/02/2023 02/25/23   Edson Graces, MD  ferrous sulfate  325 (65 FE) MG tablet Take 1 tablet (325 mg total) by mouth every other day. Patient not taking: Reported on 05/02/2023 03/18/23 10/02/23  Etter Hermann., MD  fluticasone -salmeterol (ADVAIR) 250-50 MCG/ACT AEPB Inhale 1 puff into the lungs in the morning and at bedtime. Patient not taking: Reported on 09/15/2023    [provider]  furosemide  (LASIX ) 20 MG tablet Take 1 tablet (20 mg total) by mouth daily. Patient not taking: Reported on 05/02/2023 02/24/23   Spence Dux, PA-C  gabapentin  (NEURONTIN ) 300 MG capsule Take 1 capsule (300 mg total) by mouth 3 (three) times daily. 05/01/23   Juliana Ocean, DO  hyoscyamine  (LEVSIN/SL) 0.125 MG SL tablet Place 1 tablet (0.125 mg total) under the tongue every 4 (four) hours as needed (pain). Up to 1.25 mg daily Patient not taking: Reported on 05/02/2023 01/23/23   Harris, Abigail, PA-C  ipratropium-albuterol  (DUONEB) 0.5-2.5 (3) MG/3ML SOLN Take 3 mLs by nebulization every 6 (six) hours as needed (for shortness of breath or wheezing). Patient not taking: Reported on 09/15/2023    [provider]  levETIRAcetam  (KEPPRA  XR) 500 MG 24 hr tablet Take 1 tablet (500 mg total) by mouth 2 (two) times daily. Patient not taking: Reported on 09/15/2023 07/07/23   Mordecai Applebaum,  MD  metFORMIN  (GLUCOPHAGE -XR) 750 MG 24 hr tablet Take 1 tablet (750 mg total) by mouth daily. 06/17/23 07/17/23  Onuoha, Josephine C, NP  nicotine  (NICODERM CQ  - DOSED IN MG/24 HOURS) 21 mg/24hr patch Place 1 patch (21 mg total) onto the skin daily. Patient not taking: Reported on 09/15/2023 06/16/23   Onuoha, Josephine C, NP  nicotine  polacrilex (NICORETTE ) 2 MG gum Take 1 each (2 mg total) by mouth as needed for smoking cessation. Patient not taking: Reported on 09/15/2023 06/16/23   Onuoha,  Josephine C, NP  ondansetron  (ZOFRAN ) 4 MG tablet Take 1 tablet (4 mg total) by mouth every 6 (six) hours. Patient not taking: Reported on 05/02/2023 01/20/23   Prosperi, Christian H, PA-C  ondansetron  (ZOFRAN -ODT) 4 MG disintegrating tablet Take 1 tablet (4 mg total) by mouth every 8 (eight) hours as needed. Patient not taking: Reported on 09/15/2023 07/16/23   Kelsey Patricia, MD  OXYGEN  Inhale 4-5 L/min into the lungs as needed (for COPD-related symptoms). Patient not taking: Reported on 09/15/2023    [provider]  pantoprazole  (PROTONIX ) 40 MG tablet Take 1 tablet (40 mg total) by mouth daily. Patient not taking: Reported on 06/12/2023 03/17/23 06/12/23  Etter Hermann., MD  predniSONE  (DELTASONE ) 10 MG tablet Take 4 tablets (40 mg total) by mouth daily. Patient not taking: Reported on 09/15/2023 07/16/23   Kelsey Patricia, MD  predniSONE  (DELTASONE ) 20 MG tablet 3 tabs po day one, then 2 po daily x 4 days 10/29/23   Palumbo, April, MD  predniSONE  (STERAPRED UNI-PAK 21 TAB) 10 MG (21) TBPK tablet Take by mouth daily. Take 6 tabs by mouth daily  for 2 days, then 5 tabs for 2 days, then 4 tabs for 2 days, then 3 tabs for 2 days, 2 tabs for 2 days, then 1 tab by mouth daily for 2 days 09/28/23   Felicie Horning, PA-C  sucralfate  (CARAFATE ) 1 g tablet Take 1 tablet (1 g total) by mouth 4 (four) times daily -  with meals and at bedtime. Patient not taking: Reported on 05/02/2023 02/11/23   Horton, Vonzella Guernsey, MD  tiZANidine  (ZANAFLEX ) 2 MG tablet Take 2 mg by mouth at bedtime. Patient not taking: Reported on 05/02/2023 02/19/23   [provider]  zonisamide  (ZONEGRAN ) 100 MG capsule Take 2 capsules (200 mg total) by mouth daily. Patient not taking: Reported on 06/12/2023 01/12/23   Faith Homes, MD      Allergies    Iron  dextran, Aspirin , and Penicillins    Review of Systems   Review of Systems  Constitutional:  Negative for chills and fever.  Respiratory:  Negative for  shortness of breath.   Cardiovascular:  Negative for chest pain.  Gastrointestinal:  Negative for abdominal pain and vomiting.  Neurological:  Negative for seizures and headaches.  All other systems reviewed and are negative.   Physical Exam Updated Vital Signs BP 128/74   Pulse (!) 130   Temp 98.7 F (37.1 C)   Resp (!) 24   LMP 01/08/2011   SpO2 99%  Physical Exam Vitals and nursing note reviewed.  Constitutional:      General: She is not in acute distress.    Appearance: Normal appearance. She is not ill-appearing.  HENT:     Head: Normocephalic and atraumatic.     Nose: Nose normal.  Eyes:     Conjunctiva/sclera: Conjunctivae normal.  Cardiovascular:     Rate and Rhythm: Regular rhythm. Tachycardia present.  Pulmonary:  Effort: Pulmonary effort is normal. No respiratory distress.  Musculoskeletal:        General: No deformity. Normal range of motion.     Cervical back: Normal range of motion.     Comments: Ambulating without issue.  Skin:    Findings: No rash.  Neurological:     Mental Status: She is alert.     ED Results / Procedures / Treatments   Labs (all labs ordered are listed, but only abnormal results are displayed) Labs Reviewed  CBC WITH DIFFERENTIAL/PLATELET - Abnormal; Notable for the following components:      Result Value   RBC 3.63 (*)    Hemoglobin 7.4 (*)    HCT 26.3 (*)    MCV 72.5 (*)    MCH 20.4 (*)    MCHC 28.1 (*)    RDW 21.3 (*)    Neutro Abs 7.9 (*)    Lymphs Abs 0.6 (*)    All other components within normal limits  COMPREHENSIVE METABOLIC PANEL WITH GFR - Abnormal; Notable for the following components:   CO2 19 (*)    Glucose, Bld 253 (*)    Calcium  8.3 (*)    Albumin  3.1 (*)    All other components within normal limits  CBG MONITORING, ED - Abnormal; Notable for the following components:   Glucose-Capillary 268 (*)    All other components within normal limits  LIPASE, BLOOD    EKG EKG  Interpretation Date/Time:  Thursday Oct 29 2023 17:38:55 EDT Ventricular Rate:  116 PR Interval:  135 QRS Duration:  93 QT Interval:  320 QTC Calculation: 445 R Axis:   43  Text Interpretation: Sinus tachycardia LAE, consider biatrial enlargement Low voltage, precordial leads rate increased otherwise no sig change from previous Confirmed by Wynetta Heckle [phone]) on 10/29/2023 6:47:24 PM  Radiology DG Chest Port 1 View Result Date: 10/29/2023 CLINICAL DATA:  Shortness of breath EXAM: PORTABLE CHEST 1 VIEW COMPARISON:  10/28/2023 FINDINGS: The heart size and mediastinal contours are within normal limits. Both lungs are clear. The visualized skeletal structures are unremarkable. IMPRESSION: No active disease. Electronically Signed   By: Janeece Mechanic M.D.   On: 10/29/2023 02:00   DG Chest Port 1 View Result Date: 10/28/2023 CLINICAL DATA:  Shortness of breath EXAM: PORTABLE CHEST 1 VIEW COMPARISON:  09/28/2023 FINDINGS: Stable cardiomediastinal silhouette. No focal consolidation, pleural effusion, or pneumothorax. No displaced rib fractures. IMPRESSION: No active disease. Electronically Signed   By: Rozell Cornet M.D.   On: 10/28/2023 02:27    Procedures Procedures    Medications Ordered in ED Medications  nicotine  (NICODERM CQ  - dosed in mg/24 hours) patch 21 mg (21 mg Transdermal Patch Applied 10/29/23 1840)  LORazepam  (ATIVAN ) injection 0-4 mg (has no administration in time range)    Or  LORazepam  (ATIVAN ) tablet 0-4 mg (has no administration in time range)  LORazepam  (ATIVAN ) injection 0-4 mg (has no administration in time range)    Or  LORazepam  (ATIVAN ) tablet 0-4 mg (has no administration in time range)  thiamine  (VITAMIN B1) tablet 100 mg (has no administration in time range)    Or  thiamine  (VITAMIN B1) injection 100 mg (has no administration in time range)  lactated ringers  bolus 2,000 mL (has no administration in time range)  acetaminophen  (TYLENOL ) tablet 1,000 mg (1,000 mg  Oral Given 10/29/23 1840)  ondansetron  (ZOFRAN -ODT) disintegrating tablet 4 mg (4 mg Oral Given 10/29/23 1937)  oxyCODONE  (Oxy IR/ROXICODONE ) immediate release tablet 5 mg (5 mg Oral  Given 10/29/23 1936)  LORazepam  (ATIVAN ) tablet 1 mg (1 mg Oral Given 10/29/23 2119)    ED Course/ Medical Decision Making/ A&P Clinical Course as of 10/29/23 2147  Thu Oct 29, 2023  2013 Pulse Rate(!): 124 [AA]    Clinical Course User Index [AA] Lucina Sabal, PA-C                                 Medical Decision Making Amount and/or Complexity of Data Reviewed Labs: ordered. Radiology: ordered.  Risk OTC drugs. Prescription drug management.   Medical Decision Making / ED Course   This patient presents to the ED for concern of alcohol  intoxication, falls, this involves an extensive number of treatment options, and is a complaint that carries with it a high risk of complications and morbidity.  The differential diagnosis includes alcohol  withdrawals, fracture, intracranial bleed  MDM: 58 year old female presents with above-mentioned complaint.  She appears anxious and wants to step out to smoke a cigarette.  She states she is unsure why she is here and her daughter called the ambulance. She does endorse some mild abdominal pain.  Over the epigastric region.  She is agreeable to to take a nicotine  patch and allow us  to get some blood work.  Will push p.o. fluids for her tachycardia.  Currently appears stable and without any signs of withdrawal.  CBC without leukocytosis.  Hemoglobin is 7.4 which is around her baseline.  CMP shows glucose of 253, preserved renal function.  Lipase within normal.  No evidence of pancreatitis.  Collateral later obtained by daughter who is mentioning multiple falls today.  Will add on CT head and cervical spine.  On reevaluation patient appears very anxious and twitching.  Will add on CIWA protocol and provide IV fluids.  Patient agreeable for IV at this time.  Patient's care  signed out to Dr. Mittie Anchors to follow-up on imaging and reevaluation.    Additional history obtained: -Additional history obtained from daughter -External records from outside source obtained and reviewed including: Chart review including previous notes, labs, imaging, consultation notes   Lab Tests: -I ordered, reviewed, and interpreted labs.   The pertinent results include:   Labs Reviewed  CBC WITH DIFFERENTIAL/PLATELET - Abnormal; Notable for the following components:      Result Value   RBC 3.63 (*)    Hemoglobin 7.4 (*)    HCT 26.3 (*)    MCV 72.5 (*)    MCH 20.4 (*)    MCHC 28.1 (*)    RDW 21.3 (*)    Neutro Abs 7.9 (*)    Lymphs Abs 0.6 (*)    All other components within normal limits  COMPREHENSIVE METABOLIC PANEL WITH GFR - Abnormal; Notable for the following components:   CO2 19 (*)    Glucose, Bld 253 (*)    Calcium  8.3 (*)    Albumin  3.1 (*)    All other components within normal limits  CBG MONITORING, ED - Abnormal; Notable for the following components:   Glucose-Capillary 268 (*)    All other components within normal limits  LIPASE, BLOOD      EKG  EKG Interpretation Date/Time:  Thursday Oct 29 2023 17:38:55 EDT Ventricular Rate:  116 PR Interval:  135 QRS Duration:  93 QT Interval:  320 QTC Calculation: 445 R Axis:   43  Text Interpretation: Sinus tachycardia LAE, consider biatrial enlargement Low voltage, precordial leads rate increased otherwise  no sig change from previous Confirmed by Wynetta Heckle [phone]) on 10/29/2023 6:47:24 PM         Imaging Studies ordered: I ordered imaging studies including CT head and cervical spine ordered but not resulted at the end of shift change. I independently visualized and interpreted imaging. I agree with the radiologist interpretation   Medicines ordered and prescription drug management: Meds ordered this encounter  Medications   nicotine  (NICODERM CQ  - dosed in mg/24 hours) patch 21 mg    acetaminophen  (TYLENOL ) tablet 1,000 mg   ondansetron  (ZOFRAN -ODT) disintegrating tablet 4 mg   oxyCODONE  (Oxy IR/ROXICODONE ) immediate release tablet 5 mg    Refill:  0   LORazepam  (ATIVAN ) tablet 1 mg   OR Linked Order Group    LORazepam  (ATIVAN ) injection 0-4 mg     CIWA-AR < 5 =:   0 mg     CIWA-AR 5 -10 =:   1 mg     CIWA-AR 11 -15 =:   2 mg     CIWA-AR 16 -20 =:   3 mg     CIWA-AR 16 -20 =:   Recheck CIWA-AR in 1 hour; if > 20 notify MD     CIWA-AR > 20 =:   4 mg     CIWA-AR > 20 =:   Notify MD    LORazepam  (ATIVAN ) tablet 0-4 mg     CIWA-AR < 5 =:   0 mg     CIWA-AR 5 -10 =:   1 mg     CIWA-AR 11 -15 =:   2 mg     CIWA-AR 16 -20 =:   3 mg     CIWA-AR 16 -20 =:   Recheck CIWA-AR in 1 hour; if > 20 notify MD     CIWA-AR > 20 =:   4 mg     CIWA-AR > 20 =:   Notify MD   OR Linked Order Group    LORazepam  (ATIVAN ) injection 0-4 mg     CIWA-AR < 5 =:   0 mg     CIWA-AR 5 -10 =:   1 mg     CIWA-AR 11 -15 =:   2 mg     CIWA-AR 16 -20 =:   3 mg     CIWA-AR 16 -20 =:   Recheck CIWA-AR in 1 hour; if > 20 notify MD     CIWA-AR > 20 =:   4 mg     CIWA-AR > 20 =:   Notify MD    LORazepam  (ATIVAN ) tablet 0-4 mg     CIWA-AR < 5 =:   0 mg     CIWA-AR 5 -10 =:   1 mg     CIWA-AR 11 -15 =:   2 mg     CIWA-AR 16 -20 =:   3 mg     CIWA-AR 16 -20 =:   Recheck CIWA-AR in 1 hour; if > 20 notify MD     CIWA-AR > 20 =:   4 mg     CIWA-AR > 20 =:   Notify MD   OR Linked Order Group    thiamine  (VITAMIN B1) tablet 100 mg    thiamine  (VITAMIN B1) injection 100 mg   lactated ringers  bolus 2,000 mL    -I have reviewed the patients home medicines and have made adjustments as needed     Reevaluation: After the interventions noted above, I reevaluated the patient and found that they  have :stayed the same  Co morbidities that complicate the patient evaluation  Past Medical History:  Diagnosis Date   Abdominal pain    Accidental drug overdose April 2013   Anxiety    Atrial fibrillation  (HCC) 09/29/11   converted spontaneously   Chronic back pain    Chronic knee pain    Chronic nausea    Chronic pain    COPD (chronic obstructive pulmonary disease) (HCC)    Depression    Diabetes mellitus    states her doctor took her off all DM meds in past month   Diabetic neuropathy (HCC)    Dyspnea    with exertion    GERD (gastroesophageal reflux disease)    Headache(784.0)    migraines    HTN (hypertension)    not on meds since in a year    Hyperlipidemia    Hypothyroidism    not on meds in a while    Mental disorder    Bipolar and schizophrenic   Nausea and vomiting 01/02/2023   Requires supplemental oxygen     as needed per patient    Schizophrenia (HCC)    Schizophrenia, acute (HCC) 11/13/2017   Tobacco abuse       Dispostion: Signout to oncoming provider.   Final Clinical Impression(s) / ED Diagnoses Final diagnoses:  Fall, initial encounter  Alcoholic intoxication without complication [facility])    Rx / DC Orders ED Discharge Orders     None         Lucina Sabal, PA-C 10/29/23 2152    Wynetta Heckle, MD 10/29/23 2156

## 2023-10-29 NOTE — Discharge Planning (Signed)
 RNCM received call from pt daughter and POA requesting call from EDP regarding having EDP call her and update her prior ro pt discharge.    Vladimir Lenhoff J. Rachel Budds, RN, BSN, NCM  Transitions of Care  Nurse Case Manager  [facility] Emergency Departments  Operative Services  [phone]

## 2023-10-29 NOTE — ED Notes (Signed)
 Gave pt ice water RN made aware

## 2023-10-29 NOTE — ED Provider Notes (Signed)
 I provided a substantive portion of the care of this patient.  I personally made/approved the management plan for this patient and take responsibility for the patient management.  EKG Interpretation Date/Time:  Thursday Oct 29 2023 17:38:55 EDT Ventricular Rate:  116 PR Interval:  135 QRS Duration:  93 QT Interval:  320 QTC Calculation: 445 R Axis:   43  Text Interpretation: Sinus tachycardia LAE, consider biatrial enlargement Low voltage, precordial leads rate increased otherwise no sig change from previous Confirmed by Wynetta Heckle [phone]) on 10/29/2023 6:47:24 PM   Patient is a several presentations to the emergency department over the past several days.  Patient reports she is felt anxious and weak.  EMS was actually called by the patient's daughter for patient being lethargic and having multiple falls over the past several days.  Patient reports last alcohol  use yesterday but she does not smell of alcohol .  Reviewing recent labs, alcohol  has been negative on multiple occasions.  On my examination the patient is drowsy.  However she did have 1 mg of Ativan  given earlier for suspected alcohol  withdrawal with some appearance of tremulousness.  She awakens to light stimulus but is mildly confused.  Patient does have extensive wheezing bilaterally.  Known history of COPD.  Abdomen is soft without guarding.  No lower extremity edema.  After completing chart review patient has been consistently tachycardic.  She has anemia with hemoglobin 7.1.  At this time I suspect other potential etiologies for worsening imbalance and frequent falls.  Patient will require admission for workup for anemia with persistent tachycardia which currently is not clearly due to alcohol  withdrawal.  Patient also appears to be having COPD exacerbation at this time.  Will start DuoNeb and add a venous gas.  Consult: [facility] hospitalist [facility] accepts for admission   Wynetta Heckle, MD 10/29/23 2357

## 2023-10-29 NOTE — ED Notes (Signed)
 Discharge instructions provided by edp was reviewed with pt. Pt verbalized understanding.   Pt had bus pass. Was provided with a warm blanket and gingerale.

## 2023-10-29 NOTE — ED Triage Notes (Signed)
 Patient bib EMS from daughters house for lethargy. Patient admitted to drinking today but does not know how much. Patient is also diabetic with a CBG of 446. She has not taken her metformin  in a few days.  Patient also has complaints of upper quadrant pain.  Received 400 NS enroute.

## 2023-10-29 NOTE — ED Provider Notes (Signed)
 [place] [facility]MENT AT [facility] Provider Note   CSN: [id number] Arrival date & time: 10/29/23  0129     History  Chief Complaint  Patient presents with   Shortness of Breath    [name] is a 58 y.o. female.  The history is provided by the patient.  Shortness of Breath Severity:  Moderate Onset quality:  Gradual Duration:  1 hour Timing:  Constant Progression:  Unchanged Chronicity:  Recurrent Context: smoke exposure   Context: not URI   Relieved by:  Nothing Worsened by:  Nothing Ineffective treatments:  None tried Associated symptoms: cough and wheezing   Associated symptoms: no chest pain and no fever   Risk factors: no hx of PE/DVT and no prolonged immobilization   Well known to the ED for similar seen within the last 24 hours.  Was smoking and wheezing started and is out of inhaler.       Home Medications Prior to Admission medications   Medication Sig Start Date End Date Taking? Authorizing Provider  albuterol  (VENTOLIN  HFA) 108 (90 Base) MCG/ACT inhaler Inhale 1-2 puffs into the lungs every 6 (six) hours as needed for wheezing or shortness of breath. 10/29/23  Yes Ianmichael Amescua, MD  predniSONE  (DELTASONE ) 20 MG tablet 3 tabs po day one, then 2 po daily x 4 days 10/29/23  Yes Mikyla Schachter, MD  albuterol  (VENTOLIN  HFA) 108 (90 Base) MCG/ACT inhaler Inhale 1-2 puffs into the lungs every 6 (six) hours as needed for wheezing or shortness of breath. Patient not taking: Reported on 09/15/2023 07/15/23   Pollina, Christopher J, MD  budesonide -formoterol  (SYMBICORT ) 160-4.5 MCG/ACT inhaler Inhale 2 puffs into the lungs 2 (two) times daily. Patient not taking: Reported on 09/15/2023 02/25/23   Edson Graces, MD  cefdinir  (OMNICEF ) 300 MG capsule Take 1 capsule (300 mg total) by mouth 2 (two) times daily. Patient not taking: Reported on 09/15/2023 07/16/23   Kelsey Patricia, MD  doxycycline  (VIBRAMYCIN ) 100 MG capsule Take 1 capsule (100 mg total)  by mouth 2 (two) times daily. 09/28/23   Felicie Horning, PA-C  famotidine  (PEPCID ) 20 MG tablet Take 1 tablet (20 mg total) by mouth daily. Patient not taking: Reported on 05/02/2023 02/25/23   Edson Graces, MD  ferrous sulfate  325 (65 FE) MG tablet Take 1 tablet (325 mg total) by mouth every other day. Patient not taking: Reported on 05/02/2023 03/18/23 10/02/23  Etter Hermann., MD  fluticasone -salmeterol (ADVAIR) 250-50 MCG/ACT AEPB Inhale 1 puff into the lungs in the morning and at bedtime. Patient not taking: Reported on 09/15/2023    [provider]  furosemide  (LASIX ) 20 MG tablet Take 1 tablet (20 mg total) by mouth daily. Patient not taking: Reported on 05/02/2023 02/24/23   Spence Dux, PA-C  gabapentin  (NEURONTIN ) 300 MG capsule Take 1 capsule (300 mg total) by mouth 3 (three) times daily. 05/01/23   Juliana Ocean, DO  hyoscyamine  (LEVSIN/SL) 0.125 MG SL tablet Place 1 tablet (0.125 mg total) under the tongue every 4 (four) hours as needed (pain). Up to 1.25 mg daily Patient not taking: Reported on 05/02/2023 01/23/23   Harris, Abigail, PA-C  ipratropium-albuterol  (DUONEB) 0.5-2.5 (3) MG/3ML SOLN Take 3 mLs by nebulization every 6 (six) hours as needed (for shortness of breath or wheezing). Patient not taking: Reported on 09/15/2023    [provider]  levETIRAcetam  (KEPPRA  XR) 500 MG 24 hr tablet Take 1 tablet (500 mg total) by mouth 2 (two)  times daily. Patient not taking: Reported on 09/15/2023 07/07/23   Mordecai Applebaum, MD  metFORMIN  (GLUCOPHAGE -XR) 750 MG 24 hr tablet Take 1 tablet (750 mg total) by mouth daily. 06/17/23 07/17/23  Onuoha, Josephine C, NP  nicotine  (NICODERM CQ  - DOSED IN MG/24 HOURS) 21 mg/24hr patch Place 1 patch (21 mg total) onto the skin daily. Patient not taking: Reported on 09/15/2023 06/16/23   Onuoha, Josephine C, NP  nicotine  polacrilex (NICORETTE ) 2 MG gum Take 1 each (2 mg total) by mouth as needed for smoking  cessation. Patient not taking: Reported on 09/15/2023 06/16/23   Onuoha, Josephine C, NP  ondansetron  (ZOFRAN ) 4 MG tablet Take 1 tablet (4 mg total) by mouth every 6 (six) hours. Patient not taking: Reported on 05/02/2023 01/20/23   Prosperi, Christian H, PA-C  ondansetron  (ZOFRAN -ODT) 4 MG disintegrating tablet Take 1 tablet (4 mg total) by mouth every 8 (eight) hours as needed. Patient not taking: Reported on 09/15/2023 07/16/23   Kelsey Patricia, MD  OXYGEN  Inhale 4-5 L/min into the lungs as needed (for COPD-related symptoms). Patient not taking: Reported on 09/15/2023    [provider]  pantoprazole  (PROTONIX ) 40 MG tablet Take 1 tablet (40 mg total) by mouth daily. Patient not taking: Reported on 06/12/2023 03/17/23 06/12/23  Etter Hermann., MD  predniSONE  (DELTASONE ) 10 MG tablet Take 4 tablets (40 mg total) by mouth daily. Patient not taking: Reported on 09/15/2023 07/16/23   Kelsey Patricia, MD  predniSONE  (STERAPRED UNI-PAK 21 TAB) 10 MG (21) TBPK tablet Take by mouth daily. Take 6 tabs by mouth daily  for 2 days, then 5 tabs for 2 days, then 4 tabs for 2 days, then 3 tabs for 2 days, 2 tabs for 2 days, then 1 tab by mouth daily for 2 days 09/28/23   Felicie Horning, PA-C  sucralfate  (CARAFATE ) 1 g tablet Take 1 tablet (1 g total) by mouth 4 (four) times daily -  with meals and at bedtime. Patient not taking: Reported on 05/02/2023 02/11/23   Horton, Vonzella Guernsey, MD  tiZANidine  (ZANAFLEX ) 2 MG tablet Take 2 mg by mouth at bedtime. Patient not taking: Reported on 05/02/2023 02/19/23   [provider]  zonisamide  (ZONEGRAN ) 100 MG capsule Take 2 capsules (200 mg total) by mouth daily. Patient not taking: Reported on 06/12/2023 01/12/23   Faith Homes, MD      Allergies    Iron  dextran, Aspirin , and Penicillins    Review of Systems   Review of Systems  Constitutional:  Negative for fever.  Respiratory:  Positive for cough, shortness of breath and wheezing.    Cardiovascular:  Negative for chest pain, palpitations and leg swelling.  All other systems reviewed and are negative.   Physical Exam Updated Vital Signs BP 111/65   Pulse 98   Temp 98.1 F (36.7 C) (Oral)   Resp (!) 22   Ht 5\' 2"  (1.575 m)   Wt 60 kg   LMP 01/08/2011   SpO2 93%   BMI 24.19 kg/m  Physical Exam Vitals and nursing note reviewed.  Constitutional:      General: She is not in acute distress.    Appearance: Normal appearance. She is well-developed.  HENT:     Head: Normocephalic and atraumatic.     Nose: Nose normal.  Eyes:     Pupils: Pupils are equal, round, and reactive to light.  Cardiovascular:     Rate and Rhythm: Normal rate and regular rhythm.  Pulses: Normal pulses.     Heart sounds: Normal heart sounds.  Pulmonary:     Effort: Pulmonary effort is normal. No respiratory distress.     Breath sounds: Wheezing present.  Abdominal:     General: Bowel sounds are normal. There is no distension.     Palpations: Abdomen is soft.     Tenderness: There is no abdominal tenderness. There is no guarding or rebound.  Musculoskeletal:        General: Normal range of motion.     Cervical back: Neck supple.  Skin:    General: Skin is warm and dry.     Capillary Refill: Capillary refill takes less than 2 seconds.     Findings: No erythema or rash.  Neurological:     General: No focal deficit present.     Mental Status: She is alert and oriented to person, place, and time.     Deep Tendon Reflexes: Reflexes normal.  Psychiatric:        Mood and Affect: Mood normal.     ED Results / Procedures / Treatments   Labs (all labs ordered are listed, but only abnormal results are displayed) Results for orders placed or performed during the hospital encounter of 10/29/23  Resp panel by RT-PCR (RSV, Flu A&B, Covid) Anterior Nasal Swab   Collection Time: 10/29/23  1:41 AM   Specimen: Anterior Nasal Swab  Result Value Ref Range   SARS Coronavirus 2 by RT PCR  NEGATIVE NEGATIVE   Influenza A by PCR NEGATIVE NEGATIVE   Influenza B by PCR NEGATIVE NEGATIVE   Resp Syncytial Virus by PCR NEGATIVE NEGATIVE  Urinalysis, Routine w reflex microscopic -Urine, Clean Catch   Collection Time: 10/29/23  1:43 AM  Result Value Ref Range   Color, Urine STRAW (A) YELLOW   APPearance CLEAR CLEAR   Specific Gravity, Urine 1.004 (L) 1.005 - 1.030   pH 6.0 5.0 - 8.0   Glucose, UA NEGATIVE NEGATIVE mg/dL   Hgb urine dipstick NEGATIVE NEGATIVE   Bilirubin Urine NEGATIVE NEGATIVE   Ketones, ur NEGATIVE NEGATIVE mg/dL   Protein, ur NEGATIVE NEGATIVE mg/dL   Nitrite NEGATIVE NEGATIVE   Leukocytes,Ua NEGATIVE NEGATIVE  Basic metabolic panel   Collection Time: 10/29/23  2:06 AM  Result Value Ref Range   Sodium 139 135 - 145 mmol/L   Potassium 4.2 3.5 - 5.1 mmol/L   Chloride 103 98 - 111 mmol/L   CO2 26 22 - 32 mmol/L   Glucose, Bld 141 (H) 70 - 99 mg/dL   BUN 7 6 - 20 mg/dL   Creatinine, Ser 1.61 0.44 - 1.00 mg/dL   Calcium  8.3 (L) 8.9 - 10.3 mg/dL   GFR, Estimated >09 >60 mL/min   Anion gap 10 5 - 15  CBC   Collection Time: 10/29/23  2:06 AM  Result Value Ref Range   WBC 6.4 4.0 - 10.5 K/uL   RBC 3.57 (L) 3.87 - 5.11 MIL/uL   Hemoglobin 7.5 (L) 12.0 - 15.0 g/dL   HCT 45.4 (L) 09.8 - 11.9 %   MCV 72.8 (L) 80.0 - 100.0 fL   MCH 21.0 (L) 26.0 - 34.0 pg   MCHC 28.8 (L) 30.0 - 36.0 g/dL   RDW 14.7 (H) 82.9 - 56.2 %   Platelets PLATELET CLUMPS NOTED ON SMEAR, UNABLE TO ESTIMATE 150 - 400 K/uL   nRBC 0.0 0.0 - 0.2 %   DG Chest Port 1 View Result Date: 10/29/2023 CLINICAL DATA:  Shortness of  breath EXAM: PORTABLE CHEST 1 VIEW COMPARISON:  10/28/2023 FINDINGS: The heart size and mediastinal contours are within normal limits. Both lungs are clear. The visualized skeletal structures are unremarkable. IMPRESSION: No active disease. Electronically Signed   By: Janeece Mechanic M.D.   On: 10/29/2023 02:00   DG Chest Port 1 View Result Date: 10/28/2023 CLINICAL DATA:   Shortness of breath EXAM: PORTABLE CHEST 1 VIEW COMPARISON:  09/28/2023 FINDINGS: Stable cardiomediastinal silhouette. No focal consolidation, pleural effusion, or pneumothorax. No displaced rib fractures. IMPRESSION: No active disease. Electronically Signed   By: Rozell Cornet M.D.   On: 10/28/2023 02:27   CT Cervical Spine Wo Contrast Result Date: 10/07/2023 CLINICAL DATA:  Neck trauma.  Intoxicated or a tended. EXAM: CT CERVICAL SPINE WITHOUT CONTRAST TECHNIQUE: Multidetector CT imaging of the cervical spine was performed without intravenous contrast. Multiplanar CT image reconstructions were also generated. RADIATION DOSE REDUCTION: This exam was performed according to the departmental dose-optimization program which includes automated exposure control, adjustment of the mA and/or kV according to patient size and/or use of iterative reconstruction technique. COMPARISON:  09/23/2023 FINDINGS: Alignment: Normal Skull base and vertebrae: No fracture or focal bone lesion. Soft tissues and spinal canal: No traumatic soft tissue finding. 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 Central disc herniation at C5-6 with some calcification. No apparent compressive narrowing of the canal or foramina because of this however. The other levels show mild non-compressive disc bulges. No change since prior exams. Upper chest: Negative Other: None IMPRESSION: No acute or traumatic finding. 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 Central disc herniation at C5-6 with some calcification. No apparent compressive narrowing of the canal or foramina because of this however. Electronically Signed   By: Bettylou Brunner M.D.   On: 10/07/2023 15:36   CT Head Wo Contrast Result Date: 10/07/2023 CLINICAL DATA:  Head trauma.  Abnormal mental status. EXAM: CT HEAD WITHOUT CONTRAST TECHNIQUE: Contiguous axial images were obtained from the base of the skull  through the vertex without intravenous contrast. RADIATION DOSE REDUCTION: This exam was performed according to the departmental dose-optimization program which includes automated exposure control, adjustment of the mA and/or kV according to patient size and/or use of iterative reconstruction technique. COMPARISON:  09/23/2023 FINDINGS: Brain: The brain shows a normal appearance without evidence of malformation, atrophy, old or acute small or large vessel infarction, mass lesion, hemorrhage, hydrocephalus or extra-axial collection. Vascular: No hyperdense vessel. No evidence of atherosclerotic calcification. Skull: Normal.  No traumatic finding.  No focal bone lesion. Sinuses/Orbits: Sinuses are clear. Orbits appear normal. Mastoids are clear. Other: None significant IMPRESSION: Normal head CT. Electronically Signed   By: Bettylou Brunner M.D.   On: 10/07/2023 15:34     EKG EKG Interpretation Date/Time:  Thursday Oct 29 2023 01:41:15 EDT Ventricular Rate:  107 PR Interval:  158 QRS Duration:  93 QT Interval:  331 QTC Calculation: 444 R Axis:   68  Text Interpretation: Sinus tachycardia Confirmed by Keith Felten ([id number]) on 10/29/2023 2:11:59 AM  Radiology DG Chest Port 1 View Result Date: 10/29/2023 CLINICAL DATA:  Shortness of breath EXAM: PORTABLE CHEST 1 VIEW COMPARISON:  10/28/2023 FINDINGS: The heart size and mediastinal contours are within normal limits. Both lungs are clear. The visualized skeletal structures are unremarkable. IMPRESSION: No active disease. Electronically Signed   By: Janeece Mechanic M.D.   On: 10/29/2023 02:00   DG Chest Port 1 View Result Date: 10/28/2023  CLINICAL DATA:  Shortness of breath EXAM: PORTABLE CHEST 1 VIEW COMPARISON:  09/28/2023 FINDINGS: Stable cardiomediastinal silhouette. No focal consolidation, pleural effusion, or pneumothorax. No displaced rib fractures. IMPRESSION: No active disease. Electronically Signed   By: Rozell Cornet M.D.   On: 10/28/2023 02:27     Procedures Procedures    Medications Ordered in ED Medications  ipratropium-albuterol  (DUONEB) 0.5-2.5 (3) MG/3ML nebulizer solution 3 mL (3 mLs Nebulization Given 10/29/23 0200)  haloperidol  lactate (HALDOL ) injection 2 mg (2 mg Intravenous Given 10/29/23 0204)  dexamethasone  (DECADRON ) injection 10 mg (10 mg Intravenous Given 10/29/23 0206)  ipratropium-albuterol  (DUONEB) 0.5-2.5 (3) MG/3ML nebulizer solution 3 mL (3 mLs Nebulization Given 10/29/23 0315)    ED Course/ Medical Decision Making/ A&P                                 Medical Decision Making Wheezing and SOB  Amount and/or Complexity of Data Reviewed Independent Historian: EMS    Details: See above  External Data Reviewed: labs, radiology, ECG and notes.    Details: Previous ed visits reviewed  Labs: ordered.    Details: Negative covid and flu. Urine is negative normal white count 6.4, low but unchanged hemoglobin 7.5, normal platelets.  Normal electrolytes normal creatinine 0.5  Radiology: ordered and independent interpretation performed.    Details: NO PNA ECG/medicine tests: ordered and independent interpretation performed. Decision-making details documented in ED Course.  Risk Prescription drug management. Risk Details: Markedly improved post medication.      Final Clinical Impression(s) / ED Diagnoses Final diagnoses:  COPD exacerbation (HCC)  No signs of systemic illness or infection. The patient is nontoxic-appearing on exam and vital signs are within normal limits.  I have reviewed the triage vital signs and the nursing notes. Pertinent labs & imaging results that were available during my care of the patient were reviewed by me and considered in my medical decision making (see chart for details). After history, exam, and medical workup I feel the patient has been appropriately medically screened and is safe for discharge home. Pertinent diagnoses were discussed with the patient. Patient was given return  precautions.   Rx / DC Orders ED Discharge Orders          Ordered    albuterol  (VENTOLIN  HFA) 108 (90 Base) MCG/ACT inhaler  Every 6 hours PRN        10/29/23 0417    predniSONE  (DELTASONE ) 20 MG tablet        10/29/23 0417              Alanee Ting, MD 10/29/23 0423

## 2023-10-29 NOTE — ED Triage Notes (Signed)
 [facility] hour ago. Hx of COPD, PRN O2 as needed. Pt has inhaler that expired 6 years ago, does not have a PCP. Wheezing all over, more profound on L side. Pt was 84% on RA with EMS. Pt had 1 duoneb, pt now has clear lung sounds and 100% on RA. Pt reports recent cold and abdominal pain. Pt ambulatory from bathroom to room during triage.

## 2023-10-29 NOTE — ED Provider Notes (Signed)
 [facility] AT [facility] Provider Note   CSN: [id number] Arrival date & time: 10/29/23  0450     History  Chief Complaint  Patient presents with   Alcohol  Problem    [name] is a 58 y.o. female.   Alcohol  Problem This is a chronic problem. The current episode started more than 1 week ago. The problem occurs constantly. The problem has not changed since onset.Pertinent negatives include no chest pain and no abdominal pain.  Just discharged and checked right back in for alcohol  problem.  Did not leave the department.       Home Medications Prior to Admission medications   Medication Sig Start Date End Date Taking? Authorizing Provider  carbamazepine  (TEGRETOL ) 200 MG tablet 800mg  PO QD X 1D, then 600mg  PO QD X 1D, then 400mg  QD X 1D, then 200mg  PO QD X 2D 10/29/23  Yes Shadana Pry, MD  albuterol  (VENTOLIN  HFA) 108 (90 Base) MCG/ACT inhaler Inhale 1-2 puffs into the lungs every 6 (six) hours as needed for wheezing or shortness of breath. Patient not taking: Reported on 09/15/2023 07/15/23   Pollina, Christopher J, MD  albuterol  (VENTOLIN  HFA) 108 [phone] Base) MCG/ACT inhaler Inhale 1-2 puffs into the lungs every 6 (six) hours as needed for wheezing or shortness of breath. 10/29/23   Ophie Burrowes, MD  budesonide -formoterol  (SYMBICORT ) 160-4.5 MCG/ACT inhaler Inhale 2 puffs into the lungs 2 (two) times daily. Patient not taking: Reported on 09/15/2023 02/25/23   Edson Graces, MD  cefdinir  (OMNICEF ) 300 MG capsule Take 1 capsule (300 mg total) by mouth 2 (two) times daily. Patient not taking: Reported on 09/15/2023 07/16/23   Kelsey Patricia, MD  doxycycline  (VIBRAMYCIN ) 100 MG capsule Take 1 capsule (100 mg total) by mouth 2 (two) times daily. 09/28/23   Felicie Horning, PA-C  famotidine  (PEPCID ) 20 MG tablet Take 1 tablet (20 mg total) by mouth daily. Patient not taking: Reported on 05/02/2023 02/25/23   Edson Graces, MD  ferrous sulfate  325 (65 FE)  MG tablet Take 1 tablet (325 mg total) by mouth every other day. Patient not taking: Reported on 05/02/2023 03/18/23 10/02/23  Etter Hermann., MD  fluticasone -salmeterol (ADVAIR) 250-50 MCG/ACT AEPB Inhale 1 puff into the lungs in the morning and at bedtime. Patient not taking: Reported on 09/15/2023    [provider]  furosemide  (LASIX ) 20 MG tablet Take 1 tablet (20 mg total) by mouth daily. Patient not taking: Reported on 05/02/2023 02/24/23   Spence Dux, PA-C  gabapentin  (NEURONTIN ) 300 MG capsule Take 1 capsule (300 mg total) by mouth 3 (three) times daily. 05/01/23   Juliana Ocean, DO  hyoscyamine  (LEVSIN/SL) 0.125 MG SL tablet Place 1 tablet (0.125 mg total) under the tongue every 4 (four) hours as needed (pain). Up to 1.25 mg daily Patient not taking: Reported on 05/02/2023 01/23/23   Harris, Abigail, PA-C  ipratropium-albuterol  (DUONEB) 0.5-2.5 (3) MG/3ML SOLN Take 3 mLs by nebulization every 6 (six) hours as needed (for shortness of breath or wheezing). Patient not taking: Reported on 09/15/2023    [provider]  levETIRAcetam  (KEPPRA  XR) 500 MG 24 hr tablet Take 1 tablet (500 mg total) by mouth 2 (two) times daily. Patient not taking: Reported on 09/15/2023 07/07/23   Mordecai Applebaum, MD  metFORMIN  (GLUCOPHAGE -XR) 750 MG 24 hr tablet Take 1 tablet (750 mg total) by mouth daily. 06/17/23 07/17/23  Onuoha, Josephine C, NP  nicotine  (NICODERM CQ  -  DOSED IN MG/24 HOURS) 21 mg/24hr patch Place 1 patch (21 mg total) onto the skin daily. Patient not taking: Reported on 09/15/2023 06/16/23   Onuoha, Josephine C, NP  nicotine  polacrilex (NICORETTE ) 2 MG gum Take 1 each (2 mg total) by mouth as needed for smoking cessation. Patient not taking: Reported on 09/15/2023 06/16/23   Onuoha, Josephine C, NP  ondansetron  (ZOFRAN ) 4 MG tablet Take 1 tablet (4 mg total) by mouth every 6 (six) hours. Patient not taking: Reported on 05/02/2023 01/20/23   Prosperi, Christian H, PA-C   ondansetron  (ZOFRAN -ODT) 4 MG disintegrating tablet Take 1 tablet (4 mg total) by mouth every 8 (eight) hours as needed. Patient not taking: Reported on 09/15/2023 07/16/23   Kelsey Patricia, MD  OXYGEN  Inhale 4-5 L/min into the lungs as needed (for COPD-related symptoms). Patient not taking: Reported on 09/15/2023    [provider]  pantoprazole  (PROTONIX ) 40 MG tablet Take 1 tablet (40 mg total) by mouth daily. Patient not taking: Reported on 06/12/2023 03/17/23 06/12/23  Etter Hermann., MD  predniSONE  (DELTASONE ) 10 MG tablet Take 4 tablets (40 mg total) by mouth daily. Patient not taking: Reported on 09/15/2023 07/16/23   Kelsey Patricia, MD  predniSONE  (DELTASONE ) 20 MG tablet 3 tabs po day one, then 2 po daily x 4 days 10/29/23   Sonam Huelsmann, MD  predniSONE  (STERAPRED UNI-PAK 21 TAB) 10 MG (21) TBPK tablet Take by mouth daily. Take 6 tabs by mouth daily  for 2 days, then 5 tabs for 2 days, then 4 tabs for 2 days, then 3 tabs for 2 days, 2 tabs for 2 days, then 1 tab by mouth daily for 2 days 09/28/23   Felicie Horning, PA-C  sucralfate  (CARAFATE ) 1 g tablet Take 1 tablet (1 g total) by mouth 4 (four) times daily -  with meals and at bedtime. Patient not taking: Reported on 05/02/2023 02/11/23   Horton, Vonzella Guernsey, MD  tiZANidine  (ZANAFLEX ) 2 MG tablet Take 2 mg by mouth at bedtime. Patient not taking: Reported on 05/02/2023 02/19/23   [provider]  zonisamide  (ZONEGRAN ) 100 MG capsule Take 2 capsules (200 mg total) by mouth daily. Patient not taking: Reported on 06/12/2023 01/12/23   Faith Homes, MD      Allergies    Iron  dextran, Aspirin , and Penicillins    Review of Systems   Review of Systems  Constitutional:  Negative for fever.  Cardiovascular:  Negative for chest pain.  Gastrointestinal:  Negative for abdominal pain.  All other systems reviewed and are negative.   Physical Exam Updated Vital Signs BP 126/70 (BP Location: Left Arm)   Pulse (!) 106    Temp 98 F (36.7 C) (Oral)   Resp (!) 21   LMP 01/08/2011   SpO2 94%  Physical Exam Vitals and nursing note reviewed.  Constitutional:      General: She is not in acute distress.    Appearance: She is well-developed.  HENT:     Head: Normocephalic and atraumatic.     Nose: Nose normal.  Eyes:     Pupils: Pupils are equal, round, and reactive to light.  Cardiovascular:     Rate and Rhythm: Normal rate and regular rhythm.     Pulses: Normal pulses.     Heart sounds: Normal heart sounds.  Pulmonary:     Effort: Pulmonary effort is normal. No respiratory distress.     Breath sounds: Normal breath sounds.  Abdominal:  General: Bowel sounds are normal. There is no distension.     Palpations: Abdomen is soft.     Tenderness: There is no abdominal tenderness. There is no guarding or rebound.  Musculoskeletal:        General: Normal range of motion.     Cervical back: Neck supple.  Skin:    General: Skin is warm and dry.     Capillary Refill: Capillary refill takes less than 2 seconds.     Findings: No erythema or rash.  Neurological:     General: No focal deficit present.     Deep Tendon Reflexes: Reflexes normal.  Psychiatric:        Mood and Affect: Mood normal.        Behavior: Behavior normal.     ED Results / Procedures / Treatments   Labs (all labs ordered are listed, but only abnormal results are displayed) Labs Reviewed - No data to display  EKG None  Radiology DG Chest [facility] 1 View Result Date: 10/29/2023 CLINICAL DATA:  Shortness of breath EXAM: PORTABLE CHEST 1 VIEW COMPARISON:  10/28/2023 FINDINGS: The heart size and mediastinal contours are within normal limits. Both lungs are clear. The visualized skeletal structures are unremarkable. IMPRESSION: No active disease. Electronically Signed   By: Janeece Mechanic M.D.   On: 10/29/2023 02:00   DG Chest Port 1 View Result Date: 10/28/2023 CLINICAL DATA:  Shortness of breath EXAM: PORTABLE CHEST 1 VIEW COMPARISON:   09/28/2023 FINDINGS: Stable cardiomediastinal silhouette. No focal consolidation, pleural effusion, or pneumothorax. No displaced rib fractures. IMPRESSION: No active disease. Electronically Signed   By: Rozell Cornet M.D.   On: 10/28/2023 02:27    Procedures Procedures    Medications Ordered in ED Medications - No data to display  ED Course/ Medical Decision Making/ A&P                                 Medical Decision Making Patient checked back in for alcohol  use disorder   Amount and/or Complexity of Data Reviewed External Data Reviewed: labs and notes.    Details: See my previous notes and labs   Risk Prescription drug management. Risk Details: She is not in withdrawal at this time.  She has just been discharged.  Resources provided.  She is stable for discharge.     Final Clinical Impression(s) / ED Diagnoses Final diagnoses:  Alcohol  abuse    No signs of systemic illness or infection. The patient is nontoxic-appearing on exam and vital signs are within normal limits.  I have reviewed the triage vital signs and the nursing notes. Pertinent labs & imaging results that were available during my care of the patient were reviewed by me and considered in my medical decision making (see chart for details). After history, exam, and medical workup I feel the patient has been appropriately medically screened and is safe for discharge home. Pertinent diagnoses were discussed with the patient. Patient was given return precautions.  Rx / DC Orders ED Discharge Orders          Ordered    carbamazepine  (TEGRETOL ) 200 MG tablet        10/29/23 0458              Alhaji Mcneal, MD 10/29/23 0500

## 2023-10-30 ENCOUNTER — Encounter (HOSPITAL_COMMUNITY): Payer: Self-pay | Admitting: Internal Medicine

## 2023-10-30 ENCOUNTER — Observation Stay (HOSPITAL_COMMUNITY)

## 2023-10-30 DIAGNOSIS — G9340 Encephalopathy, unspecified: Secondary | ICD-10-CM

## 2023-10-30 DIAGNOSIS — E860 Dehydration: Secondary | ICD-10-CM | POA: Diagnosis present

## 2023-10-30 DIAGNOSIS — W1830XA Fall on same level, unspecified, initial encounter: Secondary | ICD-10-CM | POA: Diagnosis present

## 2023-10-30 DIAGNOSIS — D509 Iron deficiency anemia, unspecified: Secondary | ICD-10-CM | POA: Diagnosis not present

## 2023-10-30 DIAGNOSIS — F250 Schizoaffective disorder, bipolar type: Secondary | ICD-10-CM | POA: Diagnosis present

## 2023-10-30 DIAGNOSIS — Z9981 Dependence on supplemental oxygen: Secondary | ICD-10-CM | POA: Diagnosis not present

## 2023-10-30 DIAGNOSIS — Z833 Family history of diabetes mellitus: Secondary | ICD-10-CM | POA: Diagnosis not present

## 2023-10-30 DIAGNOSIS — R410 Disorientation, unspecified: Secondary | ICD-10-CM | POA: Diagnosis present

## 2023-10-30 DIAGNOSIS — E1165 Type 2 diabetes mellitus with hyperglycemia: Secondary | ICD-10-CM | POA: Diagnosis present

## 2023-10-30 DIAGNOSIS — Z91148 Patient's other noncompliance with medication regimen for other reason: Secondary | ICD-10-CM | POA: Diagnosis not present

## 2023-10-30 DIAGNOSIS — F1910 Other psychoactive substance abuse, uncomplicated: Secondary | ICD-10-CM | POA: Diagnosis present

## 2023-10-30 DIAGNOSIS — Z8249 Family history of ischemic heart disease and other diseases of the circulatory system: Secondary | ICD-10-CM | POA: Diagnosis not present

## 2023-10-30 DIAGNOSIS — Z7951 Long term (current) use of inhaled steroids: Secondary | ICD-10-CM | POA: Diagnosis not present

## 2023-10-30 DIAGNOSIS — E785 Hyperlipidemia, unspecified: Secondary | ICD-10-CM | POA: Diagnosis present

## 2023-10-30 DIAGNOSIS — E1140 Type 2 diabetes mellitus with diabetic neuropathy, unspecified: Secondary | ICD-10-CM | POA: Diagnosis present

## 2023-10-30 DIAGNOSIS — Z96641 Presence of right artificial hip joint: Secondary | ICD-10-CM | POA: Diagnosis present

## 2023-10-30 DIAGNOSIS — Z886 Allergy status to analgesic agent status: Secondary | ICD-10-CM | POA: Diagnosis not present

## 2023-10-30 DIAGNOSIS — J441 Chronic obstructive pulmonary disease with (acute) exacerbation: Secondary | ICD-10-CM | POA: Diagnosis present

## 2023-10-30 DIAGNOSIS — R296 Repeated falls: Secondary | ICD-10-CM | POA: Diagnosis present

## 2023-10-30 DIAGNOSIS — F1010 Alcohol abuse, uncomplicated: Secondary | ICD-10-CM | POA: Diagnosis present

## 2023-10-30 DIAGNOSIS — Y900 Blood alcohol level of less than 20 mg/100 ml: Secondary | ICD-10-CM | POA: Diagnosis present

## 2023-10-30 DIAGNOSIS — F17210 Nicotine dependence, cigarettes, uncomplicated: Secondary | ICD-10-CM | POA: Diagnosis present

## 2023-10-30 DIAGNOSIS — I10 Essential (primary) hypertension: Secondary | ICD-10-CM | POA: Diagnosis present

## 2023-10-30 DIAGNOSIS — I4891 Unspecified atrial fibrillation: Secondary | ICD-10-CM | POA: Diagnosis present

## 2023-10-30 DIAGNOSIS — G40909 Epilepsy, unspecified, not intractable, without status epilepticus: Secondary | ICD-10-CM | POA: Diagnosis present

## 2023-10-30 DIAGNOSIS — Z1152 Encounter for screening for COVID-19: Secondary | ICD-10-CM | POA: Diagnosis not present

## 2023-10-30 DIAGNOSIS — E039 Hypothyroidism, unspecified: Secondary | ICD-10-CM | POA: Diagnosis present

## 2023-10-30 DIAGNOSIS — G9341 Metabolic encephalopathy: Secondary | ICD-10-CM | POA: Diagnosis present

## 2023-10-30 DIAGNOSIS — F1990 Other psychoactive substance use, unspecified, uncomplicated: Secondary | ICD-10-CM | POA: Insufficient documentation

## 2023-10-30 DIAGNOSIS — Z9884 Bariatric surgery status: Secondary | ICD-10-CM | POA: Diagnosis not present

## 2023-10-30 LAB — CBC
HCT: 26.1 % — ABNORMAL LOW (ref 36.0–46.0)
Hemoglobin: 7.2 g/dL — ABNORMAL LOW (ref 12.0–15.0)
MCH: 20.1 pg — ABNORMAL LOW (ref 26.0–34.0)
MCHC: 27.6 g/dL — ABNORMAL LOW (ref 30.0–36.0)
MCV: 72.9 fL — ABNORMAL LOW (ref 80.0–100.0)
Platelets: 228 10*3/uL (ref 150–400)
RBC: 3.58 MIL/uL — ABNORMAL LOW (ref 3.87–5.11)
RDW: 21.5 % — ABNORMAL HIGH (ref 11.5–15.5)
WBC: 9.5 10*3/uL (ref 4.0–10.5)
nRBC: 0 % (ref 0.0–0.2)

## 2023-10-30 LAB — BASIC METABOLIC PANEL WITH GFR
Anion gap: 13 (ref 5–15)
BUN: 7 mg/dL (ref 6–20)
CO2: 23 mmol/L (ref 22–32)
Calcium: 8.5 mg/dL — ABNORMAL LOW (ref 8.9–10.3)
Chloride: 102 mmol/L (ref 98–111)
Creatinine, Ser: 0.59 mg/dL (ref 0.44–1.00)
GFR, Estimated: >60 mL/min (ref >60)
Glucose, Bld: 117 mg/dL — ABNORMAL HIGH (ref 70–99)
Potassium: 3.6 mmol/L (ref 3.5–5.1)
Sodium: 138 mmol/L (ref 135–145)

## 2023-10-30 LAB — ETHANOL: Alcohol, Ethyl (B): <15 mg/dL (ref <15)

## 2023-10-30 LAB — RESP PANEL BY RT-PCR (RSV, FLU A&B, COVID)  RVPGX2
Influenza A by PCR: NEGATIVE
Influenza B by PCR: NEGATIVE
Resp Syncytial Virus by PCR: NEGATIVE
SARS Coronavirus 2 by RT PCR: NEGATIVE

## 2023-10-30 LAB — URINALYSIS, ROUTINE W REFLEX MICROSCOPIC
Bacteria, UA: NONE SEEN
Bilirubin Urine: NEGATIVE
Glucose, UA: >=500 mg/dL — AB
Hgb urine dipstick: NEGATIVE
Ketones, ur: NEGATIVE mg/dL
Leukocytes,Ua: NEGATIVE
Nitrite: NEGATIVE
Protein, ur: NEGATIVE mg/dL
Specific Gravity, Urine: 1.003 — ABNORMAL LOW (ref 1.005–1.030)
pH: 5 (ref 5.0–8.0)

## 2023-10-30 LAB — VITAMIN B12: Vitamin B-12: 329 pg/mL (ref 180–914)

## 2023-10-30 LAB — IRON AND TIBC
Iron: 10 ug/dL — ABNORMAL LOW (ref 28–170)
Saturation Ratios: 2 % — ABNORMAL LOW (ref 10.4–31.8)
TIBC: 532 ug/dL — ABNORMAL HIGH (ref 250–450)
UIBC: 522 ug/dL

## 2023-10-30 LAB — RAPID URINE DRUG SCREEN, HOSP PERFORMED
Amphetamines: NOT DETECTED
Barbiturates: NOT DETECTED
Benzodiazepines: NOT DETECTED
Cocaine: NOT DETECTED
Opiates: NOT DETECTED
Tetrahydrocannabinol: NOT DETECTED

## 2023-10-30 LAB — LACTIC ACID, PLASMA: Lactic Acid, Venous: 3.1 mmol/L (ref 0.5–1.9)

## 2023-10-30 LAB — PHOSPHORUS: Phosphorus: 3.6 mg/dL (ref 2.5–4.6)

## 2023-10-30 LAB — AMMONIA: Ammonia: 29 umol/L (ref 9–35)

## 2023-10-30 LAB — TRANSFERRIN: Transferrin: 365 mg/dL (ref 192–382)

## 2023-10-30 LAB — RPR
RPR Ser Ql: REACTIVE — AB
RPR Titer: 1:1 {titer}

## 2023-10-30 LAB — TSH: TSH: 2.345 u[IU]/mL (ref 0.350–4.500)

## 2023-10-30 LAB — HIV ANTIBODY (ROUTINE TESTING W REFLEX): HIV Screen 4th Generation wRfx: NONREACTIVE

## 2023-10-30 LAB — MAGNESIUM: Magnesium: 2.1 mg/dL (ref 1.7–2.4)

## 2023-10-30 LAB — FERRITIN: Ferritin: 8 ng/mL — ABNORMAL LOW (ref 11–307)

## 2023-10-30 LAB — FOLATE: Folate: 5.4 ng/mL — ABNORMAL LOW (ref >5.9)

## 2023-10-30 MED ORDER — LORAZEPAM 2 MG/ML IJ SOLN
1.0000 mg | INTRAMUSCULAR | Status: DC | PRN
  Administered 2023-10-30 – 2023-10-31 (×5): 2 mg via INTRAVENOUS
  Filled 2023-10-30 (×7): qty 1

## 2023-10-30 MED ORDER — THIAMINE HCL 100 MG/ML IJ SOLN
500.0000 mg | Freq: Three times a day (TID) | INTRAVENOUS | Status: DC
  Administered 2023-10-30 – 2023-10-31 (×4): 500 mg via INTRAVENOUS
  Filled 2023-10-30: qty 5
  Filled 2023-10-30: qty 500
  Filled 2023-10-30: qty 5
  Filled 2023-10-30: qty 500
  Filled 2023-10-30 (×2): qty 5

## 2023-10-30 MED ORDER — ACETAMINOPHEN 500 MG PO TABS
1000.0000 mg | ORAL_TABLET | Freq: Four times a day (QID) | ORAL | Status: DC
  Administered 2023-10-30 – 2023-10-31 (×5): 1000 mg via ORAL
  Filled 2023-10-30 (×5): qty 2

## 2023-10-30 MED ORDER — THIAMINE HCL 100 MG/ML IJ SOLN: 250.0000 mg | Freq: Every day | INTRAVENOUS | Status: DC

## 2023-10-30 MED ORDER — SODIUM CHLORIDE 0.9% FLUSH
3.0000 mL | Freq: Two times a day (BID) | INTRAVENOUS | Status: DC
  Administered 2023-10-30 – 2023-10-31 (×2): 3 mL via INTRAVENOUS

## 2023-10-30 MED ORDER — IPRATROPIUM-ALBUTEROL 0.5-2.5 (3) MG/3ML IN SOLN
3.0000 mL | Freq: Four times a day (QID) | RESPIRATORY_TRACT | Status: DC | PRN
  Administered 2023-10-30 – 2023-10-31 (×2): 3 mL via RESPIRATORY_TRACT
  Filled 2023-10-30 (×2): qty 3

## 2023-10-30 MED ORDER — ADULT MULTIVITAMIN W/MINERALS CH
1.0000 | ORAL_TABLET | Freq: Every day | ORAL | Status: DC
  Administered 2023-10-30: 1 via ORAL
  Filled 2023-10-30: qty 1

## 2023-10-30 MED ORDER — ENOXAPARIN SODIUM 40 MG/0.4ML IJ SOSY: 40.0000 mg | PREFILLED_SYRINGE | INTRAMUSCULAR | Status: DC

## 2023-10-30 MED ORDER — SODIUM CHLORIDE 0.9 % IV SOLN: INTRAVENOUS | Status: AC

## 2023-10-30 MED ORDER — THIAMINE MONONITRATE 100 MG PO TABS: 100.0000 mg | ORAL_TABLET | Freq: Every day | ORAL | Status: DC

## 2023-10-30 MED ORDER — LORAZEPAM 1 MG PO TABS
1.0000 mg | ORAL_TABLET | ORAL | Status: DC | PRN
  Administered 2023-10-30 (×2): 1 mg via ORAL
  Administered 2023-10-31: 3 mg via ORAL
  Filled 2023-10-30: qty 1
  Filled 2023-10-30: qty 2
  Filled 2023-10-30: qty 1
  Filled 2023-10-30: qty 3

## 2023-10-30 MED ORDER — FOLIC ACID 1 MG PO TABS
1.0000 mg | ORAL_TABLET | Freq: Every day | ORAL | Status: DC
  Administered 2023-10-30: 1 mg via ORAL
  Filled 2023-10-30: qty 1

## 2023-10-30 MED ORDER — GUAIFENESIN-DM 100-10 MG/5ML PO SYRP
5.0000 mL | ORAL_SOLUTION | ORAL | Status: DC | PRN
  Administered 2023-10-31 (×2): 5 mL via ORAL
  Filled 2023-10-30 (×2): qty 5

## 2023-10-30 MED ORDER — PANTOPRAZOLE SODIUM 40 MG PO TBEC
40.0000 mg | DELAYED_RELEASE_TABLET | Freq: Every day | ORAL | Status: DC
  Administered 2023-10-30 – 2023-10-31 (×2): 40 mg via ORAL
  Filled 2023-10-30 (×2): qty 1

## 2023-10-30 MED ORDER — NICOTINE POLACRILEX 2 MG MT GUM
2.0000 mg | CHEWING_GUM | OROMUCOSAL | Status: DC | PRN
  Administered 2023-10-30 (×2): 2 mg via ORAL
  Filled 2023-10-30 (×4): qty 1

## 2023-10-30 MED ORDER — LEVETIRACETAM ER 500 MG PO TB24
500.0000 mg | ORAL_TABLET | Freq: Two times a day (BID) | ORAL | Status: DC
  Administered 2023-10-30 – 2023-10-31 (×3): 500 mg via ORAL
  Filled 2023-10-30 (×4): qty 1

## 2023-10-30 MED ORDER — ARIPIPRAZOLE 10 MG PO TABS
10.0000 mg | ORAL_TABLET | Freq: Every day | ORAL | Status: DC
  Administered 2023-10-30 – 2023-10-31 (×2): 10 mg via ORAL
  Filled 2023-10-30 (×2): qty 1

## 2023-10-30 NOTE — ED Notes (Signed)
 ED TO INPATIENT HANDOFF REPORT  ED Nurse Name and Phone #: D Corda Shutt   S Name/Age/Gender [name] 58 y.o. female Room/Bed: 007C/007C  Code Status   Code Status: Full Code  Home/SNF/Other Home Patient oriented to: situation Is this baseline? Yes   Triage Complete: Triage complete  Chief Complaint Ground-level fall [W18.30XA]  Triage Note Patient bib EMS from daughters house for lethargy. Patient admitted to drinking today but does not know how much. Patient is also diabetic with a CBG of 446. She has not taken her metformin  in a few days.  Patient also has complaints of upper quadrant pain.  Received 400 NS enroute.    Allergies Allergies  Allergen Reactions   Iron  Dextran Shortness Of Breath and Anxiety   Aspirin  Nausea And Vomiting and Other (See Comments)    Ok to take tylenol  or ibuprofen     Penicillins Other (See Comments)    Unknown reaction from childhood    Level of Care/Admitting Diagnosis ED Disposition     ED Disposition  Admit   Condition  --   Comment  Hospital Area: [facility] [[id number]]  Level of Care: Telemetry Medical [104]  May place patient in observation at [facility] or [facility] if equivalent level of care is available:: No  Covid Evaluation: Asymptomatic - no recent exposure (last 10 days) testing not required  Diagnosis: Ground-level fall [1610960]  Admitting Physician: SEGARS, JONATHAN [[id number]]  Attending Physician: SEGARS, JONATHAN [[id number]]          B Medical/Surgery History Past Medical History:  Diagnosis Date   Abdominal pain    Accidental drug overdose April 2013   Anxiety    Atrial fibrillation (HCC) 09/29/11   converted spontaneously   Chronic back pain    Chronic knee pain    Chronic nausea    Chronic pain    COPD (chronic obstructive pulmonary disease) (HCC)    Depression    Diabetes mellitus    states her doctor took her off all DM meds in past month   Diabetic neuropathy (HCC)     Dyspnea    with exertion    GERD (gastroesophageal reflux disease)    Headache(784.0)    migraines    HTN (hypertension)    not on meds since in a year    Hyperlipidemia    Hypothyroidism    not on meds in a while    Mental disorder    Bipolar and schizophrenic   Nausea and vomiting 01/02/2023   Requires supplemental oxygen     as needed per patient    Schizophrenia (HCC)    Schizophrenia, acute (HCC) 11/13/2017   Tobacco abuse    Past Surgical History:  Procedure Laterality Date   ABDOMINAL HYSTERECTOMY     BLADDER SUSPENSION  03/04/2011   Procedure: [facility] PROCEDURE;  Surgeon: Jackelyn Marvel MacDiarmid;  Location: [facility] ORS;  Service: Urology;  Laterality: N/A;   BOWEL RESECTION N/A 04/18/2022   Procedure: SMALL BOWEL RESECTION;  Surgeon: Sim Dryer, MD;  Location: [facility] OR;  Service: General;  Laterality: N/A;   CYSTOCELE REPAIR  03/04/2011   Procedure: ANTERIOR REPAIR (CYSTOCELE);  Surgeon: Devorah Fonder;  Location: [facility];  Service: Urology;  Laterality: N/A;   CYSTOSCOPY  03/04/2011   Procedure: CYSTOSCOPY;  Surgeon: Jackelyn Marvel MacDiarmid;  Location: [facility];  Service: Urology;  Laterality: N/A;   ESOPHAGOGASTRODUODENOSCOPY (EGD) WITH PROPOFOL  N/A 05/12/2017   Procedure: ESOPHAGOGASTRODUODENOSCOPY (EGD) WITH PROPOFOL ;  Surgeon: Juanita Norlander, MD;  Location: [facility] ENDOSCOPY;  Service: General;  Laterality: N/A;   GASTRIC ROUX-EN-Y N/A 03/25/2016   Procedure: LAPAROSCOPIC ROUX-EN-Y GASTRIC BYPASS WITH UPPER ENDOSCOPY;  Surgeon: Ayesha Lente, MD;  Location: [facility];  Service: General;  Laterality: N/A;   KNEE SURGERY     LAPAROSCOPIC ASSISTED VAGINAL HYSTERECTOMY  03/04/2011   Procedure: LAPAROSCOPIC ASSISTED VAGINAL HYSTERECTOMY;  Surgeon: Martine Sleek, MD;  Location: [facility];  Service: Gynecology;  Laterality: N/A;   LAPAROTOMY N/A 04/18/2022   Procedure: EXPLORATORY LAPAROTOMY;  Surgeon: Sim Dryer, MD;  Location: [facility] OR;  Service: General;  Laterality: N/A;   LAPAROTOMY N/A  04/24/2022   Procedure: BRING BACK EXPLORATORY LAPAROTOMY;  Surgeon: Dorena Gander, MD;  Location: [facility] OR;  Service: General;  Laterality: N/A;   TOTAL HIP ARTHROPLASTY Right 08/27/2022   Procedure: TOTAL HIP ARTHROPLASTY;  Surgeon: Murleen Arms, MD;  Location: [facility] OR;  Service: Orthopedics;  Laterality: Right;     A IV Location/Drains/Wounds Patient Lines/Drains/Airways Status     Active Line/Drains/Airways     Name Placement date Placement time Site Days   Peripheral IV 10/29/23 20 G Anterior;Distal;Left Forearm 10/29/23  2143  Forearm  1            Intake/Output Last 24 hours  Intake/Output Summary (Last 24 hours) at 10/30/2023 0109 Last data filed at 10/30/2023 0008 Gross per 24 hour  Intake 2000 ml  Output --  Net 2000 ml    Labs/Imaging Results for orders placed or performed during the hospital encounter of 10/29/23 (from the past 48 hours)  CBG monitoring, ED     Status: Abnormal   Collection Time: 10/29/23  5:34 PM  Result Value Ref Range   Glucose-Capillary 268 (H) 70 - 99 mg/dL    Comment: Glucose reference range applies only to samples taken after fasting for at least 8 hours.  CBC with Differential     Status: Abnormal   Collection Time: 10/29/23  6:40 PM  Result Value Ref Range   WBC 9.0 4.0 - 10.5 K/uL   RBC 3.63 (L) 3.87 - 5.11 MIL/uL   Hemoglobin 7.4 (L) 12.0 - 15.0 g/dL    Comment: Reticulocyte Hemoglobin testing may be clinically indicated, consider ordering this additional test [id number]    HCT 26.3 (L) 36.0 - 46.0 %   MCV 72.5 (L) 80.0 - 100.0 fL   MCH 20.4 (L) 26.0 - 34.0 pg   MCHC 28.1 (L) 30.0 - 36.0 g/dL   RDW 54.0 (H) 98.1 - 19.1 %   Platelets 248 150 - 400 K/uL   nRBC 0.0 0.0 - 0.2 %   Neutrophils Relative % 88 %   Neutro Abs 7.9 (H) 1.7 - 7.7 K/uL   Lymphocytes Relative 7 %   Lymphs Abs 0.6 (L) 0.7 - 4.0 K/uL   Monocytes Relative 5 %   Monocytes Absolute 0.4 0.1 - 1.0 K/uL   Eosinophils Relative 0 %   Eosinophils Absolute 0.0  0.0 - 0.5 K/uL   Basophils Relative 0 %   Basophils Absolute 0.0 0.0 - 0.1 K/uL   WBC Morphology MORPHOLOGY UNREMARKABLE    RBC Morphology FEW TARGET CELLS NOTED    Smear Review PLATELETS APPEAR ADEQUATE    Immature Granulocytes 0 %   Abs Immature Granulocytes 0.04 0.00 - 0.07 K/uL    Comment: Performed at [facility], [address], [place], [place] [address]  Comprehensive metabolic panel     Status: Abnormal   Collection Time: 10/29/23  6:40  PM  Result Value Ref Range   Sodium 135 135 - 145 mmol/L   Potassium 4.4 3.5 - 5.1 mmol/L   Chloride 102 98 - 111 mmol/L   CO2 19 (L) 22 - 32 mmol/L   Glucose, Bld 253 (H) 70 - 99 mg/dL    Comment: Glucose reference range applies only to samples taken after fasting for at least 8 hours.   BUN 7 6 - 20 mg/dL   Creatinine, Ser 6.57 0.44 - 1.00 mg/dL   Calcium  8.3 (L) 8.9 - 10.3 mg/dL   Total Protein 7.0 6.5 - 8.1 g/dL   Albumin  3.1 (L) 3.5 - 5.0 g/dL   AST 36 15 - 41 U/L   ALT 23 0 - 44 U/L   Alkaline Phosphatase 88 38 - 126 U/L   Total Bilirubin 0.4 0.0 - 1.2 mg/dL   GFR, Estimated >84 >69 mL/min    Comment: (NOTE) Calculated using the CKD-EPI Creatinine Equation (2021)    Anion gap 14 5 - 15    Comment: Performed at [facility], [address], [place], [place] [address]  Lipase, blood     Status: None   Collection Time: 10/29/23  6:40 PM  Result Value Ref Range   Lipase 42 11 - 51 U/L    Comment: Performed at [facility], [address] [place], [place] [address]  Urinalysis, Routine w reflex microscopic -Urine, Clean Catch     Status: Abnormal   Collection Time: 10/29/23 11:38 PM  Result Value Ref Range   Color, Urine STRAW (A) YELLOW   APPearance CLEAR CLEAR   Specific Gravity, Urine 1.003 (L) 1.005 - 1.030   pH 5.0 5.0 - 8.0   Glucose, UA >=500 (A) NEGATIVE mg/dL   Hgb urine dipstick NEGATIVE NEGATIVE   Bilirubin Urine NEGATIVE NEGATIVE   Ketones, ur NEGATIVE NEGATIVE mg/dL   Protein, ur NEGATIVE  NEGATIVE mg/dL   Nitrite NEGATIVE NEGATIVE   Leukocytes,Ua NEGATIVE NEGATIVE   RBC / HPF 0-5 0 - 5 RBC/hpf   WBC, UA 0-5 0 - 5 WBC/hpf   Bacteria, UA NONE SEEN NONE SEEN   Squamous Epithelial / HPF 0-5 0 - 5 /HPF    Comment: Performed at [facility], [address], [place], [place] [address]  Urine rapid drug screen (hosp performed)     Status: None   Collection Time: 10/29/23 11:38 PM  Result Value Ref Range   Opiates NONE DETECTED NONE DETECTED   Cocaine NONE DETECTED NONE DETECTED   Benzodiazepines NONE DETECTED NONE DETECTED   Amphetamines NONE DETECTED NONE DETECTED   Tetrahydrocannabinol NONE DETECTED NONE DETECTED   Barbiturates NONE DETECTED NONE DETECTED    Comment: (NOTE) DRUG SCREEN FOR MEDICAL PURPOSES ONLY.  IF CONFIRMATION IS NEEDED FOR ANY PURPOSE, NOTIFY LAB WITHIN 5 DAYS.  LOWEST DETECTABLE LIMITS FOR URINE DRUG SCREEN Drug Class                     Cutoff (ng/mL) Amphetamine and metabolites    1000 Barbiturate and metabolites    200 Benzodiazepine                 200 Opiates and metabolites        300 Cocaine and metabolites        300 THC  50 Performed at [facility], [address], [place], [place] [address]    CT Head Wo Contrast Result Date: 10/29/2023 CLINICAL DATA:  Hyperglycemia, alcohol  intoxication, blunt trauma from fall. EXAM: CT HEAD WITHOUT CONTRAST CT CERVICAL SPINE WITHOUT CONTRAST TECHNIQUE: Multidetector CT imaging of the head and cervical spine was performed following the standard protocol without intravenous contrast. Multiplanar CT image reconstructions of the cervical spine were also generated. RADIATION DOSE REDUCTION: This exam was performed according to the departmental dose-optimization program which includes automated exposure control, adjustment of the mA and/or kV according to patient size and/or use of iterative reconstruction technique. COMPARISON:  CT head and cervical spine 10/07/2023  FINDINGS: CT HEAD FINDINGS Brain: No intracranial hemorrhage, mass effect, or evidence of acute infarct. No hydrocephalus. No extra-axial fluid collection. Vascular: No hyperdense vessel or unexpected calcification. Skull: No fracture or focal lesion. Sinuses/Orbits: No acute finding. Paranasal sinuses and mastoid air cells are well aerated. Other: None. CT CERVICAL SPINE FINDINGS Alignment: No evidence of traumatic malalignment. Skull base and vertebrae: No acute fracture. No primary bone lesion or focal pathologic process. Soft tissues and spinal canal: No prevertebral fluid or swelling. No visible canal hematoma. 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 Upper chest: Negative. Other: None. IMPRESSION: No acute intracranial abnormality. No cervical spine fracture. Electronically Signed   By: Rozell Cornet M.D.   On: 10/29/2023 21:59   CT Cervical Spine Wo Contrast Result Date: 10/29/2023 CLINICAL DATA:  Hyperglycemia, alcohol  intoxication, blunt trauma from fall. EXAM: CT HEAD WITHOUT CONTRAST CT CERVICAL SPINE WITHOUT CONTRAST TECHNIQUE: Multidetector CT imaging of the head and cervical spine was performed following the standard protocol without intravenous contrast. Multiplanar CT image reconstructions of the cervical spine were also generated. RADIATION DOSE REDUCTION: This exam was performed according to the departmental dose-optimization program which includes automated exposure control, adjustment of the mA and/or kV according to patient size and/or use of iterative reconstruction technique. COMPARISON:  CT head and cervical spine 10/07/2023 FINDINGS: CT HEAD FINDINGS Brain: No intracranial hemorrhage, mass effect, or evidence of acute infarct. No hydrocephalus. No extra-axial fluid collection. Vascular: No hyperdense vessel or unexpected calcification. Skull: No fracture or focal lesion.  Sinuses/Orbits: No acute finding. Paranasal sinuses and mastoid air cells are well aerated. Other: None. CT CERVICAL SPINE FINDINGS Alignment: No evidence of traumatic malalignment. Skull base and vertebrae: No acute fracture. No primary bone lesion or focal pathologic process. Soft tissues and spinal canal: No prevertebral fluid or swelling. No visible canal hematoma. 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 Upper chest: Negative. Other: None. IMPRESSION: No acute intracranial abnormality. No cervical spine fracture. Electronically Signed   By: Rozell Cornet M.D.   On: 10/29/2023 21:59   DG Chest Port 1 View Result Date: 10/29/2023 CLINICAL DATA:  Shortness of breath EXAM: PORTABLE CHEST 1 VIEW COMPARISON:  10/28/2023 FINDINGS: The heart size and mediastinal contours are within normal limits. Both lungs are clear. The visualized skeletal structures are unremarkable. IMPRESSION: No active disease. Electronically Signed   By: Janeece Mechanic M.D.   On: 10/29/2023 02:00   DG Chest Port 1 View Result Date: 10/28/2023 CLINICAL DATA:  Shortness of breath EXAM: PORTABLE CHEST 1 VIEW COMPARISON:  09/28/2023 FINDINGS: Stable cardiomediastinal silhouette. No focal consolidation, pleural effusion, or pneumothorax. No displaced  rib fractures. IMPRESSION: No active disease. Electronically Signed   By: Rozell Cornet M.D.   On: 10/28/2023 02:27    Pending Labs Unresulted Labs (From admission, onward)     Start     Ordered   10/30/23 0500  HIV Antibody (routine testing w rflx)  (HIV Antibody (Routine testing w reflex) panel)  Tomorrow morning,   R        10/30/23 0012   10/30/23 0500  Basic metabolic panel with GFR  Tomorrow morning,   R        10/30/23 0012   10/30/23 0500  CBC  Tomorrow morning,   R        10/30/23 0012   10/30/23 0500  Magnesium   Tomorrow morning,   R        10/30/23 0012    10/30/23 0500  Phosphorus  Tomorrow morning,   R        10/30/23 0012   10/30/23 0500  Ammonia  Tomorrow morning,   R        10/30/23 0012   10/30/23 0500  Ferritin  Tomorrow morning,   R        10/30/23 0012   10/30/23 0500  Iron  and TIBC  Tomorrow morning,   R        10/30/23 0012   10/30/23 0500  Transferrin  Tomorrow morning,   R        10/30/23 0012   10/30/23 0500  Vitamin B12  Tomorrow morning,   R        10/30/23 0012   10/30/23 0013  Lactic acid, plasma  (Lactic Acid)  ONCE - STAT,   STAT        10/30/23 0012   10/29/23 2340  Resp panel by RT-PCR (RSV, Flu A&B, Covid) Anterior Nasal Swab  Once,   URGENT        10/29/23 2339   10/29/23 2338  Ethanol  Once,   URGENT        10/29/23 2338            Vitals/Pain Today's Vitals   10/29/23 2245 10/29/23 2300 10/29/23 2345 10/30/23 0008  BP: 109/66 115/68 (!) 146/130 121/71  Pulse: (!) 119 (!) 117 (!) 123 (!) 110  Resp:    20  Temp:    98.2 F (36.8 C)  TempSrc:      SpO2: 100% 100% 100% 100%  PainSc:        Isolation Precautions No active isolations  Medications Medications  nicotine  (NICODERM CQ  - dosed in mg/24 hours) patch 21 mg (21 mg Transdermal Patch Applied 10/29/23 1840)  LORazepam  (ATIVAN ) injection 0-4 mg (1 mg Intravenous Given 10/29/23 2153)    Or  LORazepam  (ATIVAN ) tablet 0-4 mg ( Oral See Alternative 10/29/23 2153)  LORazepam  (ATIVAN ) injection 0-4 mg (has no administration in time range)    Or  LORazepam  (ATIVAN ) tablet 0-4 mg (has no administration in time range)  thiamine  (VITAMIN B1) tablet 100 mg ( Oral See Alternative 10/29/23 2152)    Or  thiamine  (VITAMIN B1) injection 100 mg (100 mg Intravenous Given 10/29/23 2152)  ipratropium-albuterol  (DUONEB) 0.5-2.5 (3) MG/3ML nebulizer solution 3 mL (has no administration in time range)  sodium chloride  flush (NS) 0.9 % injection 3 mL (has no administration in time range)  acetaminophen  (TYLENOL ) tablet 1,000 mg (1,000 mg Oral Given 10/29/23 1840)   ondansetron  (ZOFRAN -ODT) disintegrating tablet 4 mg (4 mg Oral Given 10/29/23 1937)  oxyCODONE  (Oxy IR/ROXICODONE )  immediate release tablet 5 mg (5 mg Oral Given 10/29/23 1936)  LORazepam  (ATIVAN ) tablet 1 mg (1 mg Oral Given 10/29/23 2119)  lactated ringers  bolus 2,000 mL (0 mLs Intravenous Stopped 10/30/23 0008)    Mobility walks     Focused Assessments Neuro Assessment Handoff:  Swallow screen pass? Yes          Neuro Assessment:   Neuro Checks:      Has TPA been given? No If patient is a Neuro Trauma and patient is going to OR before floor call report to 4N Charge nurse: [phone] or [phone]   R Recommendations: See Admitting Provider Note  Report given to:   Additional Notes: PT on Ciwa scores and is alert and talking.

## 2023-10-30 NOTE — Progress Notes (Signed)
 Patient very impulsive and not following directions all day and a high fall risk. Attempted distraction and redirection and patient also given medications throughout the day to help with anxiety. Telesitter trialed and patient is not following directions- MD notified and safety sitter ordered.

## 2023-10-30 NOTE — Plan of Care (Signed)
   Problem: Health Behavior/Discharge Planning: Goal: Ability to manage health-related needs will improve Outcome: Progressing

## 2023-10-30 NOTE — Progress Notes (Signed)
 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, previous vaginal prolapse status post repair  In the emergency room recently several times seen at [facility] 5/8 placed on Tegretol   Brought 5/9 to emergency room for concerns of lethargy EtOH use but EtOH levels were negative CBG 440 not compliant on metformin  and she also had  Sodium 136 potassium 3.7 BUN/creatinine 5/0.5 LFTs normal Hemoglobin 7.1 platelet 171 WBC 7.6 CXR negative, and cervical spine/8 was negative  Plan  Lethargy in the setting of recent carbamazepine  prescription Now not lethargic-states drinks alcohol  regularly but alcohol  level negative Doubt this is anything else metabolic-ammonia is 29 RPR is positive one-to-one and she had a T. pallidum that was positive in 2022 as well so if it is positive this may very well mean she has a reinfection and will probably need penicillin We will monitor trends  Polysubstance cocaine and alcohol  abuse Drug screen was negative except for benzos It is unclear whether she will quit Placed on CIWA protocol avoid narcotics Tylenol  for pain-explained to her clearly I would not be giving her narcotics for generalized pain  Schizophrenia has been on several medications over the years inclusive of amantadine  Abilify  long-acting and is noncompliant according to family I have placed patient back on Abilify  10 for her schizophrenia it appears she was placed on Abilify  10 in November but has not been taking it and is supposed to be on Depakote  500 every 12 as well as gabapentin  300 3 times daily for alcohol  abuse I have held all of these  other meds as she is noncompliant  Alcohol  related seizures We will start back Zonegran  200 daily although it is unclear whether she will comply  DM TY 2 CBGs only moderate controlled-not taking meds at home sugars are ranging 120-260 Monitor trends   Long discussion with power of attorney daughter cherished are again [phone] who is a Child psychotherapist here at this hospital Tells me these behaviors are not new We will monitor her overnight and decide on treatment with [facility] ABS if syphilis screen turns back positive  DVT prophylaxis: Lovenox   Status is: Observation The patient remains OBS appropriate and will d/c before 2 midnights.      Subjective: I anxious-impulsive according to nursing Complaining of pain all over When distracted and eating she does not complain of anything she is quite tangential No hallucinations no visual changes  No dark or tarry stool   Objective + exam Vitals:   10/30/23 0008 10/30/23 0130 10/30/23 0140 10/30/23 0229  BP: 121/71 134/78 134/78 134/78  Pulse: (!) 110 (!) 110 (!) 110 (!) 110  Resp: 20 19 19 19   Temp: 98.2 F (36.8 C) 98.3 F (36.8 C) 98.3 F (36.8 C) 98.3 F (36.8 C)  TempSrc:  Oral Oral Oral  SpO2: 100%  94%   Weight:  59.7 kg  59.7 kg  Height:  5\' 2"  (1.575 m)     Filed Weights   10/30/23 0130 10/30/23 0229  Weight: 59.7 kg 59.7 kg    Examination: EOMI NCAT Eliquis black female no distress Chest is clear no wheeze rales rhonchi ROM  is intact Power 5/5 Abdominal soft S1-S2 no murmur   Data Reviewed: reviewed   CBC    Component Value Date/Time   WBC 9.0 10/29/2023 1840   RBC 3.63 (L) 10/29/2023 1840   HGB 7.4 (L) 10/29/2023 1840   HCT 26.3 (L) 10/29/2023 1840   PLT 248 10/29/2023 1840   MCV 72.5 (L) 10/29/2023 1840   MCH 20.4 (L) 10/29/2023 1840   MCHC 28.1 (L) 10/29/2023 1840   RDW 21.3 (H) 10/29/2023 1840   LYMPHSABS 0.6 (L) 10/29/2023 1840   MONOABS 0.4 10/29/2023 1840   EOSABS 0.0 10/29/2023 1840    BASOSABS 0.0 10/29/2023 1840      Latest Ref Rng & Units 10/29/2023    6:40 PM 10/29/2023    2:06 AM 10/28/2023    2:05 AM  CMP  Glucose 70 - 99 mg/dL 295  284  132   BUN 6 - 20 mg/dL 7  7  <5   Creatinine 4.40 - 1.00 mg/dL 1.02  7.25  3.66   Sodium 135 - 145 mmol/L 135  139  136   Potassium 3.5 - 5.1 mmol/L 4.4  4.2  3.7   Chloride 98 - 111 mmol/L 102  103  100   CO2 22 - 32 mmol/L 19  26  23    Calcium  8.9 - 10.3 mg/dL 8.3  8.3  8.1   Total Protein 6.5 - 8.1 g/dL 7.0   6.1   Total Bilirubin 0.0 - 1.2 mg/dL 0.4   0.6   Alkaline Phos 38 - 126 U/L 88   93   AST 15 - 41 U/L 36   26   ALT 0 - 44 U/L 23   17     Scheduled Meds:  folic acid   1 mg Oral Daily   ipratropium-albuterol   3 mL Nebulization Once   levETIRAcetam   500 mg Oral BID   multivitamin with minerals  1 tablet Oral Daily   nicotine   21 mg Transdermal Once   pantoprazole   40 mg Oral Daily   sodium chloride  flush  3 mL Intravenous Q12H   [START ON 11/06/2023] thiamine   100 mg Oral Daily   Continuous Infusions:  thiamine  (VITAMIN B1) injection 500 mg (10/30/23 4403)   Followed by   Cecily Cohen ON 11/01/2023] thiamine  (VITAMIN B1) injection      Time  33  Verlie Glisson, MD  [facility]

## 2023-10-30 NOTE — H&P (Addendum)
 History and Physical    [name] DOB: [birth year] DOA: 10/29/2023  PCP: Patient, No Pcp Per   Patient coming from: Home   Chief Complaint:  Chief Complaint  Patient presents with   Alcohol  Intoxication   Hyperglycemia    HPI:  [name] is a 58 y.o. female with hx of alcohol  use disorder, polysubstance use, A-fib not on anticoagulation with fall risk, COPD, seizure disorder, IDA, B12 deficiency, hypothyroidism, schizophrenia, who presents after multiple ground-level falls.  Daughter called EMS after patient had multiple ground-level falls. Reports this started after taking Carbamazepine /.  On my interview patient reports about 8 falls "back-to-back" today.  Unable to explain why she has been following. Thinks that she may be withdrawing from crack which she last smoked yesterday, last alcohol  yesterday.  Denies any other injuries from fall.  Reports numerous other symptoms per below   Review of Systems:  ROS complete and:  + Shortness of breath, abdominal pain, nausea, vomiting, hematochezia, melena, generalized weakness  Allergies  Allergen Reactions   Iron  Dextran Shortness Of Breath and Anxiety   Aspirin  Nausea And Vomiting and Other (See Comments)    Ok to take tylenol  or ibuprofen     Penicillins Other (See Comments)    Unknown reaction from childhood    Prior to Admission medications   Medication Sig Start Date End Date Taking? Authorizing Provider  albuterol  (VENTOLIN  HFA) 108 (90 Base) MCG/ACT inhaler Inhale 1-2 puffs into the lungs every 6 (six) hours as needed for wheezing or shortness of breath. 10/29/23  Yes Palumbo, April, MD  carbamazepine  (TEGRETOL ) 200 MG tablet 800mg  PO QD X 1D, then 600mg  PO QD X 1D, then 400mg  QD X 1D, then 200mg  PO QD X 2D 10/29/23  Yes Palumbo, April, MD  OXYGEN  Inhale 4-5 L/min into the lungs as needed (for COPD-related symptoms).   Yes [provider]  cefdinir  (OMNICEF ) 300 MG capsule Take 1 capsule (300 mg  total) by mouth 2 (two) times daily. Patient not taking: Reported on 09/15/2023 07/16/23   Kelsey Patricia, MD  ipratropium-albuterol  (DUONEB) 0.5-2.5 (3) MG/3ML SOLN Take 3 mLs by nebulization every 6 (six) hours as needed (for shortness of breath or wheezing).    [provider]  levETIRAcetam  (KEPPRA  XR) 500 MG 24 hr tablet Take 1 tablet (500 mg total) by mouth 2 (two) times daily. Patient not taking: Reported on 10/30/2023 07/07/23   Mordecai Applebaum, MD  nicotine  (NICODERM CQ  - DOSED IN MG/24 HOURS) 21 mg/24hr patch Place 1 patch (21 mg total) onto the skin daily. Patient not taking: Reported on 09/15/2023 06/16/23   Onuoha, Josephine C, NP  nicotine  polacrilex (NICORETTE ) 2 MG gum Take 1 each (2 mg total) by mouth as needed for smoking cessation. Patient not taking: Reported on 09/15/2023 06/16/23   Onuoha, Josephine C, NP  ondansetron  (ZOFRAN -ODT) 4 MG disintegrating tablet Take 1 tablet (4 mg total) by mouth every 8 (eight) hours as needed. Patient not taking: Reported on 10/30/2023 07/16/23   Kelsey Patricia, MD  predniSONE  (DELTASONE ) 10 MG tablet Take 4 tablets (40 mg total) by mouth daily. Patient not taking: Reported on 09/15/2023 07/16/23   Kelsey Patricia, MD    Past Medical History:  Diagnosis Date   Abdominal pain    Accidental drug overdose April 2013   Anxiety    Atrial fibrillation (HCC) 09/29/11   converted spontaneously   Chronic back pain    Chronic knee pain    Chronic nausea  Chronic pain    COPD (chronic obstructive pulmonary disease) (HCC)    Depression    Diabetes mellitus    states her doctor took her off all DM meds in past month   Diabetic neuropathy (HCC)    Dyspnea    with exertion    GERD (gastroesophageal reflux disease)    Headache(784.0)    migraines    HTN (hypertension)    not on meds since in a year    Hyperlipidemia    Hypothyroidism    not on meds in a while    Mental disorder    Bipolar and schizophrenic   Nausea and vomiting  01/02/2023   Requires supplemental oxygen     as needed per patient    Schizophrenia (HCC)    Schizophrenia, acute (HCC) 11/13/2017   Tobacco abuse     Past Surgical History:  Procedure Laterality Date   ABDOMINAL HYSTERECTOMY     BLADDER SUSPENSION  03/04/2011   Procedure: [facility] PROCEDURE;  Surgeon: Jackelyn Marvel MacDiarmid;  Location: [facility];  Service: Urology;  Laterality: N/A;   BOWEL RESECTION N/A 04/18/2022   Procedure: SMALL BOWEL RESECTION;  Surgeon: Sim Dryer, MD;  Location: [facility] OR;  Service: General;  Laterality: N/A;   CYSTOCELE REPAIR  03/04/2011   Procedure: ANTERIOR REPAIR (CYSTOCELE);  Surgeon: Devorah Fonder;  Location: [facility];  Service: Urology;  Laterality: N/A;   CYSTOSCOPY  03/04/2011   Procedure: CYSTOSCOPY;  Surgeon: Jackelyn Marvel MacDiarmid;  Location: [facility];  Service: Urology;  Laterality: N/A;   ESOPHAGOGASTRODUODENOSCOPY (EGD) WITH PROPOFOL  N/A 05/12/2017   Procedure: ESOPHAGOGASTRODUODENOSCOPY (EGD) WITH PROPOFOL ;  Surgeon: Juanita Norlander, MD;  Location: [facility];  Service: General;  Laterality: N/A;   GASTRIC ROUX-EN-Y N/A 03/25/2016   Procedure: LAPAROSCOPIC ROUX-EN-Y GASTRIC BYPASS WITH UPPER ENDOSCOPY;  Surgeon: Ayesha Lente, MD;  Location: [facility];  Service: General;  Laterality: N/A;   KNEE SURGERY     LAPAROSCOPIC ASSISTED VAGINAL HYSTERECTOMY  03/04/2011   Procedure: LAPAROSCOPIC ASSISTED VAGINAL HYSTERECTOMY;  Surgeon: Martine Sleek, MD;  Location: [facility];  Service: Gynecology;  Laterality: N/A;   LAPAROTOMY N/A 04/18/2022   Procedure: EXPLORATORY LAPAROTOMY;  Surgeon: Sim Dryer, MD;  Location: [facility];  Service: General;  Laterality: N/A;   LAPAROTOMY N/A 04/24/2022   Procedure: BRING BACK EXPLORATORY LAPAROTOMY;  Surgeon: Dorena Gander, MD;  Location: [facility] OR;  Service: General;  Laterality: N/A;   TOTAL HIP ARTHROPLASTY Right 08/27/2022   Procedure: TOTAL HIP ARTHROPLASTY;  Surgeon: Murleen Arms, MD;  Location: [facility];  Service:  Orthopedics;  Laterality: Right;     reports that she has been smoking cigarettes. She has never used smokeless tobacco. She reports current alcohol  use. She reports current drug use. Drugs: Cocaine, Marijuana, and "Crack" cocaine.  Family History  Problem Relation Age of Onset   Heart attack Father        60s   Diabetes Mother    Heart disease Mother    Hypertension Mother    Heart attack Sister        56   COPD Other    Breast cancer Neg Hx      Physical Exam: Vitals:   10/29/23 2245 10/29/23 2300 10/29/23 2345 10/30/23 0008  BP: 109/66 115/68 (!) 146/130 121/71  Pulse: (!) 119 (!) 117 (!) 123 (!) 110  Resp:    20  Temp:    98.2 F (36.8 C)  TempSrc:      SpO2: 100% 100% 100% 100%  Gen: Awake, somnolent but able to maintain alertness,, acutely and chronically ill-appearing CV: Regular, tachycardic, normal S1, S2, 1/6 SEM Resp: Normal WOB, coarse breath sounds with scattered wheezes Abd: Flat, normoactive, mild upper abdominal tenderness MSK: Symmetric, no edema  Skin: No rashes or lesions to exposed skin  Neuro: Alert and interactive, somnolent, oriented to person, Arlin Benes, somewhat to situation.  Not to time.  CN II through XII intact, motor is 4-5 throughout.  Sensation intact and equal to fine touch.  Tremulous Psych: euthymic, appropriate    Data review:   Labs reviewed, notable for:   Bicarb 19, anion gap 14 Blood glucose 253 Hemoglobin 7.4, microcytic   Micro:  Results for orders placed or performed during the hospital encounter of 10/29/23  Resp panel by RT-PCR (RSV, Flu A&B, Covid) Anterior Nasal Swab     Status: None   Collection Time: 10/29/23  1:41 AM   Specimen: Anterior Nasal Swab  Result Value Ref Range Status   SARS Coronavirus 2 by RT PCR NEGATIVE NEGATIVE Final    Comment: (NOTE) SARS-CoV-2 target nucleic acids are NOT DETECTED.  The SARS-CoV-2 RNA is generally detectable in upper respiratory specimens during the acute phase of  infection. The lowest concentration of SARS-CoV-2 viral copies this assay can detect is 138 copies/mL. A negative result does not preclude SARS-Cov-2 infection and should not be used as the sole basis for treatment or other patient management decisions. A negative result may occur with  improper specimen collection/handling, submission of specimen other than nasopharyngeal swab, presence of viral mutation(s) within the areas targeted by this assay, and inadequate number of viral copies(<138 copies/mL). A negative result must be combined with clinical observations, patient history, and epidemiological information. The expected result is Negative.  Fact Sheet for Patients:  BloggerCourse.com  Fact Sheet for Healthcare Providers:  SeriousBroker.it  This test is no t yet approved or cleared by the [place]  FDA and  has been authorized for detection and/or diagnosis of SARS-CoV-2 by FDA under an Emergency Use Authorization (EUA). This EUA will remain  in effect (meaning this test can be used) for the duration of the COVID-19 declaration under Section 564(b)(1) of the Act, 21 U.S.C.section 360bbb-3(b)(1), unless the authorization is terminated  or revoked sooner.       Influenza A by PCR NEGATIVE NEGATIVE Final   Influenza B by PCR NEGATIVE NEGATIVE Final    Comment: (NOTE) The Xpert Xpress SARS-CoV-2/FLU/RSV plus assay is intended as an aid in the diagnosis of influenza from Nasopharyngeal swab specimens and should not be used as a sole basis for treatment. Nasal washings and aspirates are unacceptable for Xpert Xpress SARS-CoV-2/FLU/RSV testing.  Fact Sheet for Patients: BloggerCourse.com  Fact Sheet for Healthcare Providers: SeriousBroker.it  This test is not yet approved or cleared by the [place]  FDA and has been authorized for detection and/or diagnosis of SARS-CoV-2  by FDA under an Emergency Use Authorization (EUA). This EUA will remain in effect (meaning this test can be used) for the duration of the COVID-19 declaration under Section 564(b)(1) of the Act, 21 U.S.C. section 360bbb-3(b)(1), unless the authorization is terminated or revoked.     Resp Syncytial Virus by PCR NEGATIVE NEGATIVE Final    Comment: (NOTE) Fact Sheet for Patients: BloggerCourse.com  Fact Sheet for Healthcare Providers: SeriousBroker.it  This test is not yet approved or cleared by the [place]  FDA and has been authorized for detection and/or diagnosis of SARS-CoV-2 by FDA under an Emergency Use Authorization (EUA).  This EUA will remain in effect (meaning this test can be used) for the duration of the COVID-19 declaration under Section 564(b)(1) of the Act, 21 U.S.C. section 360bbb-3(b)(1), unless the authorization is terminated or revoked.  Performed at [facility], [address] [place], [place] [address]     Imaging reviewed:  CT Head Wo Contrast Result Date: 10/29/2023 CLINICAL DATA:  Hyperglycemia, alcohol  intoxication, blunt trauma from fall. EXAM: CT HEAD WITHOUT CONTRAST CT CERVICAL SPINE WITHOUT CONTRAST TECHNIQUE: Multidetector CT imaging of the head and cervical spine was performed following the standard protocol without intravenous contrast. Multiplanar CT image reconstructions of the cervical spine were also generated. RADIATION DOSE REDUCTION: This exam was performed according to the departmental dose-optimization program which includes automated exposure control, adjustment of the mA and/or kV according to patient size and/or use of iterative reconstruction technique. COMPARISON:  CT head and cervical spine 10/07/2023 FINDINGS: CT HEAD FINDINGS Brain: No intracranial hemorrhage, mass effect, or evidence of acute infarct. No hydrocephalus. No extra-axial fluid collection. Vascular: No  hyperdense vessel or unexpected calcification. Skull: No fracture or focal lesion. Sinuses/Orbits: No acute finding. Paranasal sinuses and mastoid air cells are well aerated. Other: None. CT CERVICAL SPINE FINDINGS Alignment: No evidence of traumatic malalignment. Skull base and vertebrae: No acute fracture. No primary bone lesion or focal pathologic process. Soft tissues and spinal canal: No prevertebral fluid or swelling. No visible canal hematoma. 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 Upper chest: Negative. Other: None. IMPRESSION: No acute intracranial abnormality. No cervical spine fracture. Electronically Signed   By: Rozell Cornet M.D.   On: 10/29/2023 21:59   CT Cervical Spine Wo Contrast Result Date: 10/29/2023 CLINICAL DATA:  Hyperglycemia, alcohol  intoxication, blunt trauma from fall. EXAM: CT HEAD WITHOUT CONTRAST CT CERVICAL SPINE WITHOUT CONTRAST TECHNIQUE: Multidetector CT imaging of the head and cervical spine was performed following the standard protocol without intravenous contrast. Multiplanar CT image reconstructions of the cervical spine were also generated. RADIATION DOSE REDUCTION: This exam was performed according to the departmental dose-optimization program which includes automated exposure control, adjustment of the mA and/or kV according to patient size and/or use of iterative reconstruction technique. COMPARISON:  CT head and cervical spine 10/07/2023 FINDINGS: CT HEAD FINDINGS Brain: No intracranial hemorrhage, mass effect, or evidence of acute infarct. No hydrocephalus. No extra-axial fluid collection. Vascular: No hyperdense vessel or unexpected calcification. Skull: No fracture or focal lesion. Sinuses/Orbits: No acute finding. Paranasal sinuses and mastoid air cells are well aerated. Other: None. CT CERVICAL SPINE FINDINGS Alignment: No evidence of traumatic  malalignment. Skull base and vertebrae: No acute fracture. No primary bone lesion or focal pathologic process. Soft tissues and spinal canal: No prevertebral fluid or swelling. No visible canal hematoma. 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 Upper chest: Negative. Other: None. IMPRESSION: No acute intracranial abnormality. No cervical spine fracture. Electronically Signed   By: Rozell Cornet M.D.   On: 10/29/2023 21:59   DG Chest Port 1 View Result Date: 10/29/2023 CLINICAL DATA:  Shortness of breath EXAM: PORTABLE CHEST 1 VIEW COMPARISON:  10/28/2023 FINDINGS: The heart size and mediastinal contours are within normal limits. Both lungs are clear. The visualized skeletal structures are unremarkable. IMPRESSION: No active disease. Electronically Signed   By: Janeece Mechanic M.D.  On: 10/29/2023 02:00   DG Chest Port 1 View Result Date: 10/28/2023 CLINICAL DATA:  Shortness of breath EXAM: PORTABLE CHEST 1 VIEW COMPARISON:  09/28/2023 FINDINGS: Stable cardiomediastinal silhouette. No focal consolidation, pleural effusion, or pneumothorax. No displaced rib fractures. IMPRESSION: No active disease. Electronically Signed   By: Rozell Cornet M.D.   On: 10/28/2023 02:27    EKG:  Personally reviewed EKG, sinus tach, LAE, no acute ischemic changes.   ED Course:  Treated with 2 L of fluid, nebulizers, Ativan , thiamine , oxycodone .    Assessment/Plan:  58 y.o. female with hx alcohol  use disorder, polysubstance use, A-fib not on anticoagulation with fall risk, COPD, seizure disorder, IDA, B12 deficiency, hypothyroidism, schizophrenia, multiple recent ED visits who presents after multiple ground-level falls.   Ground-level falls Per collateral with daughter recent change in AED to carbamazepine  and has had multiple ground-level falls.  Suspect mechanical fall in the setting of underlying  encephalopathy, deconditioning. Less likely AE from carbamazepine  (ataxia). CT head, C-spine, chest x-ray without acute injuries.  - Management of underlying encephalopathy, deconditioning per below - PT evaluation  Encephalopathy Disorientation to time and somnolence noted on initial interview.  On exam with global weakness and myoclonic tremor.  Suspect encephalopathy is multifactorial in the setting of possible alcohol  withdrawal, nutritional deficiency, or other metabolic process.  - Check UDS, VBG, B1, B12, TSH, RPR, - Empiric treatment for possible wernicke's with high-dose thiamine , can discontinue thiamine  level normal - Treatment for alcohol  withdrawal per below  Likely dehydration -S/p 2 L IV fluid. Continue oral hydration. Check lactate  At risk of alcohol  withdrawal History alcohol  use disorder, polysubstance use disorder (reported crack)  Reports last alcohol  use yesterday, reports crack use yesterday although UDS negative. - CIWA with Ativan  as needed - High-dose thiamine  per above, multivitamin, folate, - TOC for substance use resources  Microcytic anemia History of IDA, B12 deficiency - Check iron  panel, B12, folate, FOBT  Dyspepsia Reported GI bleeding  Reports upper abdominal pain and black/bloody stools although in the setting of her encephalopathy and mostly pan positive ROS unclear if she is having any acute bleeding.  - Checking FOBT - For now pantoprazole  40 mg p.o. daily if having evidence of bleeding can transition to IV twice daily and involvement of GI  Deconditioning -PT evaluation  Chronic medical problems: Afib: Currently sinus rhythm, not on anticoagulation with fall risk.  COPD: Without acute exacerbation.  DuoNeb every 6 hours. seizure disorder: Resume home Keppra ; Unclear indication for current Carbamazepine  Rxd as taper with start on 5/8; Held for now.  Hypothyroidism: Check TSH. Not on thyroid  meds  Schizophrenia: Noted   There is no height  or weight on file to calculate BMI.    DVT prophylaxis:  SCDs Code Status:  Full Code Diet:  Diet Orders (From admission, onward)     Start     Ordered   10/30/23 0011  Diet regular Room service appropriate? Yes; Fluid consistency: Thin  Diet effective now       Question Answer Comment  Room service appropriate? Yes   Fluid consistency: Thin      10/30/23 0012           Family Communication:  No  Consults:  None   Admission status:   Observation, Telemetry bed  Severity of Illness: The appropriate patient status for this patient is OBSERVATION. Observation status is judged to be reasonable and necessary in order to provide the required intensity of service to ensure the patient's safety.  The patient's presenting symptoms, physical exam findings, and initial radiographic and laboratory data in the context of their medical condition is felt to place them at decreased risk for further clinical deterioration. Furthermore, it is anticipated that the patient will be medically stable for discharge from the hospital within 2 midnights of admission.    Arnulfo Larch, MD [facility]  How to contact the [facility] Attending or Consulting provider 7A - 7P or covering provider during after hours 7P -7A, for this patient.  Check the care team in [facility] and look for a) attending/consulting [facility] provider listed and b) the [facility] team listed Log into [email or web address] and use [facility] universal password to access. If you do not have the password, please contact the hospital operator. Locate the [facility] provider you are looking for under [facility] and page to a number that you can be directly reached. If you still have difficulty reaching the provider, please page the [facility] (Director on Call) for the Hospitalists listed on amion for assistance.  10/30/2023, 1:10 AM

## 2023-10-30 NOTE — Plan of Care (Signed)
  Problem: Pain Managment: Goal: General experience of comfort will improve and/or be controlled Outcome: Progressing   Problem: Clinical Measurements: Goal: Will remain free from infection Outcome: Not Progressing   Problem: Activity: Goal: Risk for activity intolerance will decrease Outcome: Not Progressing   Problem: Coping: Goal: Level of anxiety will decrease Outcome: Not Progressing   Problem: Elimination: Goal: Will not experience complications related to urinary retention Outcome: Not Progressing

## 2023-10-30 NOTE — Inpatient Diabetes Management (Signed)
 Inpatient Diabetes Program Recommendations  AACE/ADA: New Consensus Statement on Inpatient Glycemic Control  Target Ranges:  Prepandial:   less than 140 mg/dL      Peak postprandial:   less than 180 mg/dL (1-2 hours)      Critically ill patients:  140 - 180 mg/dL    Latest Reference Range & Units 10/29/23 02:06 10/29/23 18:40 10/30/23 06:50  Glucose 70 - 99 mg/dL 962 (H) 952 (H) 841 (H)    Latest Reference Range & Units 10/29/23 17:34  Glucose-Capillary 70 - 99 mg/dL 324 (H)    Latest Reference Range & Units 01/11/23 09:00  Hemoglobin A1C 4.8 - 5.6 % 7.3 (H)   Review of Glycemic Control  Diabetes history: DM2 Outpatient Diabetes medications: None listed Current orders for Inpatient glycemic control: None  Inpatient Diabetes Program Recommendations:    Insulin : Please consider ordering CBGs AC&HS and Novolog  0-9 units TID with meals and Novolog  0-5 units at bedtime.   Diet: If appropriate, may want to discontinue Regular diet and order Carb Modified diet.  HbgA1C: Please consider ordering an A1C to evaluate glycemic control over the past 2-3 months.  Thanks, Beacher Limerick, RN, MSN, CDCES Diabetes Coordinator Inpatient Diabetes Program [phone] (Team Pager from 8am to 5pm)

## 2023-10-30 NOTE — TOC CM/SW Note (Addendum)
 Transition of Care [facility]) - Inpatient Brief Assessment   Patient Details  Name: [name] MRN: [id number] Date of Birth: May 12, 1966  [facility]) CM/SW Contact:    Arron Big, LCSWA Phone Number: 10/30/2023, 11:24 AM   Clinical Narrative: CSW attempted to speak with patient about substance use. Patient reported substance use has been an issue for her but did not agree, nor disagree to resources CSW had to provide. Patient became lethargic during conversation but repeatedly stated she wants to get up to walkcancer CSW informed patient not to get up at this time and await bedside RN or mobility/PT to assist with walking. CSW will place substance use resources on patients AVS and patients hard chart at this time.   TOC will continue to follow.     Transition of Care Asessment: Insurance and Status: Insurance coverage has been reviewed Patient has primary care physician: Yes Home environment has been reviewed: From home Prior level of function:: Independent Prior/Current Home Services: No current home services Social Drivers of Health Review: SDOH reviewed no interventions necessary Readmission risk has been reviewed: No Transition of care needs: no transition of care needs at this time

## 2023-10-30 NOTE — Progress Notes (Signed)
 Mobility Specialist Progress Note:    10/30/23 1220  Mobility  Activity Transferred to/from [facility]  Level of Assistance Minimal assist, patient does 75% or more  Assistive Device Other (Comment) (HHA)  Distance Ambulated (ft) 10 ft  Activity Response Tolerated fair  Mobility Referral Yes  Mobility visit 1 Mobility  Mobility Specialist Start Time (ACUTE ONLY) 1121  Mobility Specialist Stop Time (ACUTE ONLY) 1131  Mobility Specialist Time Calculation (min) (ACUTE ONLY) 10 min   Pt received getting OOB w./ bed alarm going off, stating she needed to use the [facility]. Pt was very lethargic getting to the [facility]. Needed mod verbal cues to keep eyes open. Pt required MinA w/ HHA to get to and from [facility]. Returned to bed w/o fault. Call bell and personal belongings in reach. All needs met. Bed alarm on. RN in room.  Inetta Manes Mobility Specialist  Please contact vis Secure Chat or  Rehab Office [phone]

## 2023-10-30 NOTE — Evaluation (Signed)
 Physical Therapy Evaluation Patient Details Name: [name] MRN: [id number] DOB: 1966-04-23 Today's Date: 10/30/2023  History of Present Illness  Pt is a 58 y/o female admitted 10/29/23 after multiple falls at home with generalized weakness, encephalopathy, and concern for GIB.  PMH positive for alcohol  use disorder, polysubstance use, a-fib not on anticoagulation, COPD, seizure disorder, IDA, B12 deficiency, hypothyroidism, schizophrenia.  Clinical Impression  Patient presents with decreased safety with mobility after recent falls.  She mobilized in the room with close S to CGA for balance and assist for IV.  She has decreased safety awareness, decreased balance and remains at risk for falls.  Unsure of home set up as pt not willing to answer many questions though seems she does live alone.  Not safe for home alone today due to fall risk and cognitive issues.  Feel she may improve though with unknown level of assistance current recommendations for inpatient rehab (<3 hours/day).  PT will follow in the acute setting.         If plan is discharge home, recommend the following: A little help with walking and/or transfers;A little help with bathing/dressing/bathroom;Assistance with cooking/housework;Assist for transportation;Help with stairs or ramp for entrance;Supervision due to cognitive status   Can travel by private vehicle        Equipment Recommendations None recommended by PT  Recommendations for Other Services       Functional Status Assessment Patient has had a recent decline in their functional status and demonstrates the ability to make significant improvements in function in a reasonable and predictable amount of time.     Precautions / Restrictions Precautions Precautions: Fall Recall of Precautions/Restrictions: Impaired      Mobility  Bed Mobility Overal bed mobility: Needs Assistance Bed Mobility: Supine to Sit     Supine to sit: Supervision     General bed  mobility comments: quickly up OOB wtih sitter alarm going off, assist for safety    Transfers Overall transfer level: Needs assistance Equipment used: None Transfers: Sit to/from Stand Sit to Stand: Supervision, Contact guard assist           General transfer comment: up to stand unaided, though with no concern for IV, wanting to take it out when it was hurting, assist for safety with balance    Ambulation/Gait Ambulation/Gait assistance: Contact guard assist Gait Distance (Feet): 200 Feet (x 2) Assistive device: None Gait Pattern/deviations: Step-through pattern, Decreased stride length, Drifts right/left       General Gait Details: drifts and needs A for safety with IV, some posterior bias with standing tasks and does not stand statically much  Stairs            Wheelchair Mobility     Tilt Bed    Modified Rankin (Stroke Patients Only)       Balance Overall balance assessment: Needs assistance   Sitting balance-Leahy Scale: Good     Standing balance support: No upper extremity supported Standing balance-Leahy Scale: Poor Standing balance comment: standing with movement with close S for safety, static at sink to wash hands some posterior bias with CGA for safety                             Pertinent Vitals/Pain Pain Assessment Pain Assessment: Faces Faces Pain Scale: Hurts even more Pain Location: back Pain Descriptors / Indicators: Aching Pain Intervention(s): Monitored during session    Home Living Family/patient expects to be discharged  to:: Private residence Living Arrangements: Alone                 Additional Comments: patient not willing for me to ask her many questions, prior notes indicate one level home with 5 STE and possibly an aide    Prior Function                       Extremity/Trunk Assessment   Upper Extremity Assessment Upper Extremity Assessment: Overall WFL for tasks assessed    Lower  Extremity Assessment Lower Extremity Assessment: Generalized weakness       Communication   Communication Communication: No apparent difficulties    Cognition Arousal: Alert Behavior During Therapy: Restless   PT - Cognitive impairments: No family/caregiver present to determine baseline, Attention, Memory, Safety/Judgement                       PT - Cognition Comments: quickly moves from one need to the next (hungry, cold, back pain, bathroom) during session; getting up even when I asked her to sit for me to get her a snack; tele sitter in the room Following commands: Impaired Following commands impaired: Follows one step commands inconsistently     Cueing       General Comments General comments (skin integrity, edema, etc.): Assisted to call for meal as pt asking for food throughout; toileted twice during session with A for balance during hygiene and hand washing    Exercises     Assessment/Plan    PT Assessment Patient needs continued PT services  PT Problem List Decreased safety awareness;Decreased balance;Decreased knowledge of use of DME;Decreased knowledge of precautions       PT Treatment Interventions DME instruction;Gait training;Stair training;Balance training;Functional mobility training;Therapeutic activities;Patient/family education    PT Goals (Current goals can be found in the Care Plan section)  Acute Rehab PT Goals Patient Stated Goal: to eat PT Goal Formulation: Patient unable to participate in goal setting Time For Goal Achievement: 11/13/23 Potential to Achieve Goals: Good    Frequency Min 2X/week     Co-evaluation               AM-PAC PT "6 Clicks" Mobility  Outcome Measure Help needed turning from your back to your side while in a flat bed without using bedrails?: None Help needed moving from lying on your back to sitting on the side of a flat bed without using bedrails?: None Help needed moving to and from a bed to a chair  (including a wheelchair)?: A Little Help needed standing up from a chair using your arms (e.g., wheelchair or bedside chair)?: A Little Help needed to walk in hospital room?: A Little Help needed climbing 3-5 steps with a railing? : A Little 6 Click Score: 20    End of Session Equipment Utilized During Treatment: Gait belt Activity Tolerance: Patient tolerated treatment well Patient left: in chair;with call bell/phone within reach;with bed alarm set Nurse Communication: Patient requests pain meds;Other (comment) (assist for safety) PT Visit Diagnosis: Repeated falls (R29.6);Other abnormalities of gait and mobility (R26.89)    Time: 6045-4098 PT Time Calculation (min) (ACUTE ONLY): 24 min   Charges:   PT Evaluation $PT Eval Moderate Complexity: 1 Mod PT Treatments $Gait Training: 8-22 mins PT General Charges $$ ACUTE PT VISIT: 1 Visit         Abigail Hoff, PT Acute Rehabilitation Services Office:[phone] 10/30/2023   Marley Simmers 10/30/2023, 5:39 PM

## 2023-10-30 NOTE — Discharge Instructions (Signed)
 In a time of Crisis:   - [facility]   Mobile Crisis Management provides immediate crisis response, 24/7.   Call [phone]   [facility] for MH/DD/SA Services?Access Center is available 24 hours a day, 7 days a week. Customer Service Specialists will assist you to find a crisis provider that is well-matched with your needs. Your local number is:?[phone]   - [facility]/Behavioral Health Urgent Care [facility])  OP, individual counseling, medication management  [address] [place], [place] [address]  ; [phone]  Call for intake hours; Medicaid and Uninsured     Outpatient Providers    [facility] ([facility])  Group and individual counseling.  [address]   [place], [place] [address]  [phone]  [place]: [phone]  [place]: [phone]  Medicaid and uninsured.     [facility]  Offers IOP groups multiple times per week.  [address] [place], [place] [address]  [phone]  Takes Medicaid and other insurances.     [facility]   Chemical Dependency Intensive Outpatient Program (IOP)  [address]  [place], [place] [address]  [phone]  Takes Nurse, learning disability and [place].     [facility]   IOP and Partial Hospitalization Program   [address]   [place], [place] [address]  [phone]  Private Insurance, [place] only for partial hospitalization    [facility]  [address], [address], [place], [place] [address]  [phone]  Monday-Friday. Short and Long term options.  [facility] Care ([facility])  IOP, individual counseling, medication management  [address] [place], [place] [address]  [phone]  Medicaid and [facility] - Day One Residence    [facility]  [address]   [place], [place] [address]  [phone]  Private Insurance and Self Pay      [facility]  [address]   [place], [place] [address]  [phone]  Private Insurance, [place], and Self Pay     [facility]   [address] [place], [place] [address]  [facility]   [address]   [place], [place] [address]  [phone]    [facility]   [address]  [place], [place] [address]  [phone]          Residential Treatment Programs    [facility] ([facility]  [address]  [place], [place] [address]  [phone] or [phone]  Detox and Residential Rehab 21 days  (Medicaid, private insurance, and self pay. If Medicare, will look into funding). No methadone. Call for pre-screen.     [facility])  [address]   [place], [place] [address]  [phone]  Detox 3-7 days (self Pay and Medicaid Limited availability). Transitional Program for females needs 60 days clean first.   Rehab Only for Males (Medicare, Medicaid, and Self Pay)-No methadone.    [facility] [address]  [address]  [place], [place] [address]  [phone] or [phone]  Private Insurance only    [facility]  PHONE:?[phone]  FAX:?[phone]  Residential program for women 21 and over for up to a year through a Christian 12-step recovery model.  Self-pay.      [facility]  [address]  [place], [place]  [address]  Phone: ?[phone]  Must be detoxed 72 hours prior to admission; 28 day program.   Self-pay.    [facility]  [address]   [place], [place]  [phone]  [employer], Medicare, [place] (not straight [place]). They offer assistance with transportation.     [facility]  [address] [place],   [place], [place] [address]  [phone]  Christian Based Program. Men only.  No insurance    [facility] is a substance use disorder treatment program for women,  including those who are pregnant, parenting, and/or whose lives have been touched by abuse and violence.  [phone]    [facility]  [address] [place], [place] [address]  Women's: [phone]  Men's: [phone]  No Medicaid.     [facility] [place]  Locations across the [place]mainly [place] willing to help with transportation.   [phone] [employer].  [facility]   [address]   [place], [place] [address]  [phone]  Treatment Only, must make assessment appointment, and must be sober for assessment appointment. Self pay, [facility], must be [facility] resident. No methadone.     [facility]   [address]  [place], [place] [address]  [phone]  No pending legal charges, Long-term work program. No methadone. Call for assessment.    [facility]   [address],  [place], [place] [address]  [phone] or [phone]  Commercial Insurance Only    [facility]  Local - [phone]  [phone]  Private Insurance (no [place]).  Males/Females, call to make referrals, multiple facilities.     [facility]  [address],   [place], [place] [address]  ?([place]  Men Only Upfront Fee  [facility]  Women's Program: [facility]  [address]  [place], [place] [address]  [phone]    [facility]-no methadone:  [facility]  [address]  [place], [place] [address]  [phone] (f)    [facility]  [address] [place], [place] [address]  [phone] [facility]  [phone]  [place], [place]  For women, houses 8 residents for sober living. No Medicaid.               AA Meetings   Meeting Locator:  [email or web address]  Also can download an app on that website.     Syringe Services Program   Due to COVID-19, syringe services programs are likely operating under different hours with limited or no fixed site hours. Some programs may not be operating at all. Please contact the program directly using the phone numbers provided below to see if they are still operating under COVID-19.   [facility] Solution to the Opioid Problem (GCSTOP)  Fixed; mobile; peer-based  Roxy Cedar  [phone]  [email or web address]   Fixed site exchange at [facility], [address]  [place], [place] [address] on Wednesdays (2:00 - 5:00 pm) and Thursdays (4:00 - 8:00 pm).  Pop-up mobile exchange locations:  [employer], [address] [place], [place] [address] on Tuesdays (11:00 am - 1:00 pm) and Fridays (11:00 am - 1:00 pm)   -[facility]?-?[address], [place], [place] [address] on Tuesdays (2:00 - 4:00 pm) and Fridays (  2:00 - 4:00 pm)   -[facility]?- also serves [place] and [employer]  [facility] [employer]  Fixed; mobile; peer-based  Lendon Ka  [phone]  [email or web address]  [address] [place], [place] [address]  Delivery and outreach available in [place] and [place], please call for more information. Monday, Tuesday: 1:00 -7:00 pm  Thursday: 4:00 pm - 8:00 pm  Friday: 1:00 pm - 8:00 pm)   Medication-Assisted Treatment (MAT)   -[facility]- services [address] and surrounding areas including [place], [place], [place], [address], [place], [place], [place], [place], and [place], [place].   Options include Methadone, buprenorphine or Suboxone.   [address], [place] [address]  [place], [place] [address]  Phone: [phone]   Mon - Fri: 5:30am - 2:00pm  Sat: 5:30am -7:30am  Sun: Closed  Holidays: 6:00am - 8:00am    -[facility]- We use FDA-approved medications, like methadone/suboxone/sublocade, and vivitrol. These  medications are then combined with customized care plans that include individual or group counseling, toxicology, and medical care directed by on-site physicians. Accepts most insurance plans, Medicaid, and private pay.    [address]  [place], [place] [address]  Phone:?[phone]   Monday-Friday 5:00 AM - 10:00 AM  Saturday  6:00 AM - 8:30 AM  Sunday 6:00 AM - 7:00 AM    -[facility]- [facility] is a treatment & recovery focused program. In addition to receiving methadone medication, our clients participate in individual and group counseling as well as random drug testing. If accepted into the [facility] Opioid Program, you will be provided several intake appointments and a physical exam   [address]  [place], [place] [address]  Office: [phone]  Fax: [phone]     -[facility]- We put our community members at the center of everything we do, for remote treatment services as well as in-person, from alcohol withdrawal to opioid use and more.?   [address], [place], [place] [address]  [phone]   Monday-Wednesday:?9:00am - 5:00pm  Thursday:?9:00am - 6:00pm  Friday:?9:00am - 5:00pm  Saturday:?9:00am - 1:00pm  Sunday:?Closed      -[facility] [place]- Our clinic in [place], a medication unit, offers daily dosing of methadone or buprenorphine in addition to our clinic in [place] offering counseling to help people overcome addiction to heroin and other opiates. We also offer psychiatric services including medication management, and an office based suboxone program.   [address], [place], [place] [address]  Phone [phone]    [facility]-We treat Opioid Addiction using medications that are a combination of buprenorphine and naloxone, which are used to treat adults addicted to narcotic painkillers and drugs such as heroin. They reduce the intense cravings and painful symptoms that accompany  withdrawal.   [address], [place], [place]  Phone: [phone]    [facility])  [address] [place], [place] [address]  [phone]   [place]  [phone]  [address] [place], [place] [address]   M-W??? 5:00am-12:00pm  Thu???? 5:00am-10:00am  Fri?????? 5:00am-12:00pm  Sat????? 5:00am-8:00am  Sun???? Closed   $12/daily for Methadone Treatment.

## 2023-10-30 NOTE — Progress Notes (Signed)
   10/30/23 0800  Assess: MEWS Score  Temp 98 F (36.7 C)  Resp 18  Level of Consciousness Alert  SpO2 95 %  O2 Device Room Air  Assess: MEWS Score  MEWS Temp 0  MEWS Systolic 0  MEWS Pulse 2  MEWS RR 0  MEWS LOC 0  MEWS Score 2  MEWS Score Color Yellow  Assess: if the MEWS score is Yellow or Red  Were vital signs accurate and taken at a resting state? Yes  Does the patient meet 2 or more of the SIRS criteria? No  MEWS guidelines implemented  Yes, yellow  Treat  MEWS Interventions Considered administering scheduled or prn medications/treatments as ordered  Take Vital Signs  Increase Vital Sign Frequency  Yellow: Q2hr x1, continue Q4hrs until patient remains green for 12hrs  Escalate  MEWS: Escalate Yellow: Discuss with charge nurse and consider notifying provider and/or RRT  Notify: Charge Nurse/RN  Name of Charge Nurse/RN Notified Quarry manager  Provider Notification  Provider Name/Title Samtani  Date Provider Notified 10/30/23  Time Provider Notified 0800  Method of Notification Face-to-face  Notification Reason Other (Comment) (yellow mews)  Provider response At bedside  Date of Provider Response 10/30/23  Assess: SIRS CRITERIA  SIRS Temperature  0  SIRS Respirations  0  SIRS Pulse 1  SIRS WBC 0  SIRS Score Sum  1

## 2023-10-30 NOTE — Plan of Care (Signed)
  Problem: Pain Managment: Goal: General experience of comfort will improve and/or be controlled Outcome: Progressing   Problem: Safety: Goal: Ability to remain free from injury will improve Outcome: Progressing

## 2023-10-31 DIAGNOSIS — G9340 Encephalopathy, unspecified: Secondary | ICD-10-CM | POA: Diagnosis not present

## 2023-10-31 DIAGNOSIS — W1830XA Fall on same level, unspecified, initial encounter: Secondary | ICD-10-CM | POA: Diagnosis not present

## 2023-10-31 LAB — CBC WITH DIFFERENTIAL/PLATELET
Abs Immature Granulocytes: 0.01 10*3/uL (ref 0.00–0.07)
Basophils Absolute: 0 10*3/uL (ref 0.0–0.1)
Basophils Relative: 0 %
Eosinophils Absolute: 0 10*3/uL (ref 0.0–0.5)
Eosinophils Relative: 1 %
HCT: 26.8 % — ABNORMAL LOW (ref 36.0–46.0)
Hemoglobin: 7.6 g/dL — ABNORMAL LOW (ref 12.0–15.0)
Immature Granulocytes: 0 %
Lymphocytes Relative: 28 %
Lymphs Abs: 1.6 10*3/uL (ref 0.7–4.0)
MCH: 20.8 pg — ABNORMAL LOW (ref 26.0–34.0)
MCHC: 28.4 g/dL — ABNORMAL LOW (ref 30.0–36.0)
MCV: 73.2 fL — ABNORMAL LOW (ref 80.0–100.0)
Monocytes Absolute: 0.5 10*3/uL (ref 0.1–1.0)
Monocytes Relative: 8 %
Neutro Abs: 3.6 10*3/uL (ref 1.7–7.7)
Neutrophils Relative %: 63 %
Platelets: UNDETERMINED 10*3/uL (ref 150–400)
RBC: 3.66 MIL/uL — ABNORMAL LOW (ref 3.87–5.11)
RDW: 21.5 % — ABNORMAL HIGH (ref 11.5–15.5)
WBC: 5.7 10*3/uL (ref 4.0–10.5)
nRBC: 0 % (ref 0.0–0.2)

## 2023-10-31 LAB — BASIC METABOLIC PANEL WITH GFR
Anion gap: 6 (ref 5–15)
BUN: 6 mg/dL (ref 6–20)
CO2: 27 mmol/L (ref 22–32)
Calcium: 7.8 mg/dL — ABNORMAL LOW (ref 8.9–10.3)
Chloride: 102 mmol/L (ref 98–111)
Creatinine, Ser: 0.56 mg/dL (ref 0.44–1.00)
GFR, Estimated: >60 mL/min (ref >60)
Glucose, Bld: 116 mg/dL — ABNORMAL HIGH (ref 70–99)
Potassium: 3.9 mmol/L (ref 3.5–5.1)
Sodium: 135 mmol/L (ref 135–145)

## 2023-10-31 MED ORDER — ARIPIPRAZOLE 10 MG PO TABS: 10.0000 mg | ORAL_TABLET | Freq: Every day | ORAL | 3 refills | Status: DC

## 2023-10-31 MED ORDER — DOXYCYCLINE HYCLATE 100 MG PO TABS: 100.0000 mg | ORAL_TABLET | Freq: Two times a day (BID) | ORAL | 0 refills | Status: DC

## 2023-10-31 MED ORDER — ZONISAMIDE 100 MG PO CAPS: 200.0000 mg | ORAL_CAPSULE | Freq: Every day | ORAL | 3 refills | Status: DC

## 2023-10-31 MED ORDER — NICOTINE POLACRILEX 2 MG MT GUM
2.0000 mg | CHEWING_GUM | Freq: Once | OROMUCOSAL | Status: AC
  Administered 2023-10-31: 2 mg via ORAL
  Filled 2023-10-31: qty 1

## 2023-10-31 MED ORDER — LEVETIRACETAM ER 500 MG PO TB24: 500.0000 mg | ORAL_TABLET | Freq: Two times a day (BID) | ORAL | 3 refills | Status: DC

## 2023-10-31 MED ORDER — METFORMIN HCL ER 750 MG PO TB24: 750.0000 mg | ORAL_TABLET | Freq: Every day | ORAL | 3 refills | Status: DC

## 2023-10-31 NOTE — Progress Notes (Cosign Needed Addendum)
 Witness fall incident occurred when pt attempted to sit down in rolling chair at nurses station and landed on her bottom. Writer witnessed fall. Pt was not injured during fall. Pt daughter [name] was notified by phone of incident. Pt daughter request that she have something for agitation. Advised daughter that the physician does not want to medicate her due to her having cognition issues present. Pt daughter advised that she can be belligerent and she will act on it. This nurse verbalized understanding. Advised that we will continue to monitor her and redirect. Pt daughter verbalized understanding.

## 2023-10-31 NOTE — Progress Notes (Signed)
 Messaged Dr. Samtani, "Tele monitor is paused because the pt will not keep the monitor on. I also paused the fluids because the pt is constantly getting out of the bed roaming around the room."

## 2023-10-31 NOTE — Discharge Summary (Signed)
 Physician Discharge Summary  [name] NUU:[id number] DOB: [birth year] DOA: 10/29/2023  PCP: Patient, No Pcp Per  Admit date: 10/29/2023 Discharge date: 10/31/2023  Time spent: 42 minutes  Recommendations for Outpatient Follow-up:  Advised family to follow FTA-ABS to rule out syphilis and if negative discontinue doxycycline -course of doxycycline  prescribed at discharge 100 mg twice daily for 14 days as alternative as patient is allergic to penicillin Needs goals of care as an outpatient given noncompliance  Discharge Diagnoses:  MAIN problem for hospitalization   Metabolic encephalopathy  Please see below for itemized issues addressed in HOpsital- refer to other progress notes for clarity if needed  Discharge Condition: Guarded overall  Diet recommendation: Diabetic  Filed Weights   10/30/23 0130 10/30/23 0229  Weight: 59.7 kg 59.7 kg    History of present illness:  58 year old black female Chronic EtOH use, prior cocaine Schizophrenia COPD DM TY 2 Previous admissions for A-fib RVR 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, previous vaginal prolapse status post repair   In the emergency room recently several times seen at [facility] 5/8 placed on Tegretol    Brought 5/9 to emergency room for concerns of lethargy EtOH use but EtOH levels were negative CBG 440 not compliant on metformin  and she also had   Sodium 136 potassium 3.7 BUN/creatinine 5/0.5 LFTs normal Hemoglobin 7.1 platelet 171 WBC 7.6 CXR negative, and cervical spine/8 was negative   Plan   Lethargy in the setting of recent carbamazepine  prescription Now not lethargic-states drinks alcohol  regularly but alcohol  level negative Doubt this is anything else metabolic-ammonia is 29  ?  Sepsis RPR is positive one-to-one and she had a T. pallidum that was positive in 2022 as well so if it is positive this may very well mean she has a reinfection and will probably need  treatment FTA-ABS was pending at time of discharge but given patient's noncompliance with therapy, agitation and overall inability to sit still and high risk for further escalation of behavior family requested discharge-FTA-ABS will be followed by family have instructed them to give twice a day doxycycline  for 14 days which should be a curative course if this is positive-if FTA-ABS is negative can discontinue   Polysubstance cocaine and alcohol  abuse Drug screen was negative except for benzos Is unlikely patient will quit drinking quit other illicit substances   Schizophrenia has been on several medications over the years inclusive of amantadine  Abilify  long-acting and is noncompliant according to family I have placed patient back on Abilify  10 for her schizophrenia it appears she was placed on Abilify  10 in November but has not been taking it  She is pretty much noncompliant on meds so I have only sent her home on the Abilify    Alcohol  related seizures We will start back Zonegran  200 daily although it is unclear whether she will comply   DM TY 2 CBGs only moderate controlled-not taking meds at home sugars are ranging 120-260 Monitor trends-I have started metformin  at discharge hopefully she will comply on the 750 XR daily dosing    Discharge Exam: Vitals:   10/31/23 1210 10/31/23 1245  BP: (!) 151/80 (!) 141/82  Pulse: (!) 117 (!) 107  Resp: 17 16  Temp: 97.6 F (36.4 C) 97.7 F (36.5 C)  SpO2: 99% 95%    Subj on day of d/c   Slightly agitated no distress Redirectable  General Exam on discharge  EOMI NCAT no focal deficits No icterus or pallor Chest clear Abdominal  soft no rebound No Lower extremity edema  Discharge Instructions   Discharge Instructions     Diet - low sodium heart healthy   Complete by: As directed    Discharge instructions   Complete by: As directed    Finish the doxycycline  unless the testign is negative Take the keppra  as directed Take the  abilfy   Increase activity slowly   Complete by: As directed       Allergies as of 10/31/2023       Reactions   Iron  Dextran Shortness Of Breath, Anxiety   Aspirin  Nausea And Vomiting, Other (See Comments)   Ok to take tylenol  or ibuprofen    Penicillins Other (See Comments)   Unknown reaction from childhood        Medication List     STOP taking these medications    carbamazepine  200 MG tablet Commonly known as: TEGretol    cefdinir  300 MG capsule Commonly known as: OMNICEF    ondansetron  4 MG disintegrating tablet Commonly known as: ZOFRAN -ODT   predniSONE  20 MG tablet Commonly known as: DELTASONE        TAKE these medications    albuterol  108 (90 Base) MCG/ACT inhaler Commonly known as: VENTOLIN  HFA Inhale 1-2 puffs into the lungs every 6 (six) hours as needed for wheezing or shortness of breath.   ARIPiprazole  10 MG tablet Commonly known as: ABILIFY  Take 1 tablet (10 mg total) by mouth daily. Start taking on: Nov 01, 2023   doxycycline  100 MG tablet Commonly known as: VIBRA -TABS Take 1 tablet (100 mg total) by mouth 2 (two) times daily.   ipratropium-albuterol  0.5-2.5 (3) MG/3ML Soln Commonly known as: DUONEB Take 3 mLs by nebulization every 6 (six) hours as needed (for shortness of breath or wheezing).   levETIRAcetam  500 MG 24 hr tablet Commonly known as: KEPPRA  XR Take 1 tablet (500 mg total) by mouth 2 (two) times daily.   metFORMIN  750 MG 24 hr tablet Commonly known as: GLUCOPHAGE -XR Take 1 tablet (750 mg total) by mouth daily with breakfast.   nicotine  21 mg/24hr patch Commonly known as: NICODERM CQ  - dosed in mg/24 hours Place 1 patch (21 mg total) onto the skin daily.   OXYGEN  Inhale 4-5 L/min into the lungs as needed (for COPD-related symptoms).   zonisamide  100 MG capsule Commonly known as: Zonegran  Take 2 capsules (200 mg total) by mouth daily.       Allergies  Allergen Reactions   Iron  Dextran Shortness Of Breath and Anxiety    Aspirin  Nausea And Vomiting and Other (See Comments)    Ok to take tylenol  or ibuprofen     Penicillins Other (See Comments)    Unknown reaction from childhood      The results of significant diagnostics from this hospitalization (including imaging, microbiology, ancillary and laboratory) are listed below for reference.    Significant Diagnostic Studies: DG Chest Port 1 View Result Date: 10/30/2023 CLINICAL DATA:  Cough EXAM: PORTABLE CHEST 1 VIEW COMPARISON:  10/29/2023 FINDINGS: The heart size and mediastinal contours are within normal limits. Both lungs are clear. The visualized skeletal structures are unremarkable. IMPRESSION: No active disease. Electronically Signed   By: Violeta Grey M.D.   On: 10/30/2023 01:23   CT Head Wo Contrast Result Date: 10/29/2023 CLINICAL DATA:  Hyperglycemia, alcohol  intoxication, blunt trauma from fall. EXAM: CT HEAD WITHOUT CONTRAST CT CERVICAL SPINE WITHOUT CONTRAST TECHNIQUE: Multidetector CT imaging of the head and cervical spine was performed following the standard protocol without intravenous contrast. Multiplanar CT  image reconstructions of the cervical spine were also generated. RADIATION DOSE REDUCTION: This exam was performed according to the departmental dose-optimization program which includes automated exposure control, adjustment of the mA and/or kV according to patient size and/or use of iterative reconstruction technique. COMPARISON:  CT head and cervical spine 10/07/2023 FINDINGS: CT HEAD FINDINGS Brain: No intracranial hemorrhage, mass effect, or evidence of acute infarct. No hydrocephalus. No extra-axial fluid collection. Vascular: No hyperdense vessel or unexpected calcification. Skull: No fracture or focal lesion. Sinuses/Orbits: No acute finding. Paranasal sinuses and mastoid air cells are well aerated. Other: None. CT CERVICAL SPINE FINDINGS Alignment: No evidence of traumatic malalignment. Skull base and vertebrae: No acute fracture. No  primary bone lesion or focal pathologic process. Soft tissues and spinal canal: No prevertebral fluid or swelling. No visible canal hematoma. 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 Upper chest: Negative. Other: None. IMPRESSION: No acute intracranial abnormality. No cervical spine fracture. Electronically Signed   By: Rozell Cornet M.D.   On: 10/29/2023 21:59   CT Cervical Spine Wo Contrast Result Date: 10/29/2023 CLINICAL DATA:  Hyperglycemia, alcohol  intoxication, blunt trauma from fall. EXAM: CT HEAD WITHOUT CONTRAST CT CERVICAL SPINE WITHOUT CONTRAST TECHNIQUE: Multidetector CT imaging of the head and cervical spine was performed following the standard protocol without intravenous contrast. Multiplanar CT image reconstructions of the cervical spine were also generated. RADIATION DOSE REDUCTION: This exam was performed according to the departmental dose-optimization program which includes automated exposure control, adjustment of the mA and/or kV according to patient size and/or use of iterative reconstruction technique. COMPARISON:  CT head and cervical spine 10/07/2023 FINDINGS: CT HEAD FINDINGS Brain: No intracranial hemorrhage, mass effect, or evidence of acute infarct. No hydrocephalus. No extra-axial fluid collection. Vascular: No hyperdense vessel or unexpected calcification. Skull: No fracture or focal lesion. Sinuses/Orbits: No acute finding. Paranasal sinuses and mastoid air cells are well aerated. Other: None. CT CERVICAL SPINE FINDINGS Alignment: No evidence of traumatic malalignment. Skull base and vertebrae: No acute fracture. No primary bone lesion or focal pathologic process. Soft tissues and spinal canal: No prevertebral fluid or swelling. No visible canal hematoma. 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 Upper chest: Negative. Other: None. IMPRESSION: No acute intracranial abnormality. No cervical spine fracture. Electronically Signed   By: Rozell Cornet M.D.   On: 10/29/2023 21:59   DG Chest Port 1 View Result Date: 10/29/2023 CLINICAL DATA:  Shortness of breath EXAM: PORTABLE CHEST 1 VIEW COMPARISON:  10/28/2023 FINDINGS: The heart size and mediastinal contours are within normal limits. Both lungs are clear. The visualized skeletal structures are unremarkable. IMPRESSION: No active disease. Electronically Signed   By: Janeece Mechanic M.D.   On: 10/29/2023 02:00   DG Chest Port 1 View Result Date: 10/28/2023 CLINICAL DATA:  Shortness of breath EXAM: PORTABLE CHEST 1 VIEW COMPARISON:  09/28/2023 FINDINGS: Stable cardiomediastinal silhouette. No focal consolidation, pleural effusion, or pneumothorax. No displaced rib fractures. IMPRESSION: No active disease. Electronically Signed   By: Rozell Cornet M.D.   On: 10/28/2023 02:27   CT Cervical Spine Wo Contrast Result Date: 10/07/2023 CLINICAL DATA:  Neck trauma.  Intoxicated or a tended. EXAM: CT CERVICAL SPINE WITHOUT CONTRAST TECHNIQUE: Multidetector CT imaging of the cervical spine was performed without intravenous contrast. Multiplanar CT image reconstructions were also  generated. RADIATION DOSE REDUCTION: This exam was performed according to the departmental dose-optimization program which includes automated exposure control, adjustment of the mA and/or kV according to patient size and/or use of iterative reconstruction technique. COMPARISON:  09/23/2023 FINDINGS: Alignment: Normal Skull base and vertebrae: No fracture or focal bone lesion. Soft tissues and spinal canal: No traumatic soft tissue finding. 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 Central disc herniation at C5-6 with some calcification. No apparent compressive narrowing of the canal or  foramina because of this however. The other levels show mild non-compressive disc bulges. No change since prior exams. Upper chest: Negative Other: None IMPRESSION: No acute or traumatic finding. 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 Central disc herniation at C5-6 with some calcification. No apparent compressive narrowing of the canal or foramina because of this however. Electronically Signed   By: Bettylou Brunner M.D.   On: 10/07/2023 15:36   CT Head Wo Contrast Result Date: 10/07/2023 CLINICAL DATA:  Head trauma.  Abnormal mental status. EXAM: CT HEAD WITHOUT CONTRAST TECHNIQUE: Contiguous axial images were obtained from the base of the skull through the vertex without intravenous contrast. RADIATION DOSE REDUCTION: This exam was performed according to the departmental dose-optimization program which includes automated exposure control, adjustment of the mA and/or kV according to patient size and/or use of iterative reconstruction technique. COMPARISON:  09/23/2023 FINDINGS: Brain: The brain shows a normal appearance without evidence of malformation, atrophy, old or acute small or large vessel infarction, mass lesion, hemorrhage, hydrocephalus or extra-axial collection. Vascular: No hyperdense vessel. No evidence of atherosclerotic calcification. Skull: Normal.  No traumatic finding.  No focal bone lesion. Sinuses/Orbits: Sinuses are clear. Orbits appear normal. Mastoids are clear. Other: None significant IMPRESSION: Normal head CT. Electronically Signed   By: Bettylou Brunner M.D.   On: 10/07/2023 15:34    Microbiology: Recent Results (from the past 240 hours)  Resp panel by RT-PCR (RSV, Flu A&B, Covid) Anterior Nasal Swab     Status: None   Collection Time: 10/29/23  1:41 AM   Specimen: Anterior Nasal Swab  Result Value Ref Range Status   SARS Coronavirus 2 by RT PCR NEGATIVE NEGATIVE Final    Comment: (NOTE) SARS-CoV-2 target nucleic acids are NOT DETECTED.  The  SARS-CoV-2 RNA is generally detectable in upper respiratory specimens during the acute phase of infection. The lowest concentration of SARS-CoV-2 viral copies this assay can detect is 138 copies/mL. A negative result does not preclude SARS-Cov-2 infection and should not be used as the sole basis for treatment or other patient management decisions. A negative result may occur with  improper specimen collection/handling, submission of specimen other than nasopharyngeal swab, presence of viral mutation(s) within the areas targeted by this assay, and inadequate number of viral copies(<138 copies/mL). A negative result must be combined with clinical observations, patient history, and epidemiological information. The expected result is Negative.  Fact Sheet for Patients:  BloggerCourse.com  Fact Sheet for Healthcare Providers:  SeriousBroker.it  This test is no t yet approved or cleared by the [place]  FDA and  has been authorized for detection and/or diagnosis of SARS-CoV-2 by FDA under an Emergency Use Authorization (EUA). This EUA will remain  in effect (meaning this test can be used) for the duration of the COVID-19 declaration under Section 564(b)(1) of the Act, 21 U.S.C.section 360bbb-3(b)(1), unless the authorization is terminated  or revoked sooner.       Influenza A  by PCR NEGATIVE NEGATIVE Final   Influenza B by PCR NEGATIVE NEGATIVE Final    Comment: (NOTE) The Xpert Xpress SARS-CoV-2/FLU/RSV plus assay is intended as an aid in the diagnosis of influenza from Nasopharyngeal swab specimens and should not be used as a sole basis for treatment. Nasal washings and aspirates are unacceptable for Xpert Xpress SARS-CoV-2/FLU/RSV testing.  Fact Sheet for Patients: BloggerCourse.com  Fact Sheet for Healthcare Providers: SeriousBroker.it  This test is not yet approved or  cleared by the [place]  FDA and has been authorized for detection and/or diagnosis of SARS-CoV-2 by FDA under an Emergency Use Authorization (EUA). This EUA will remain in effect (meaning this test can be used) for the duration of the COVID-19 declaration under Section 564(b)(1) of the Act, 21 U.S.C. section 360bbb-3(b)(1), unless the authorization is terminated or revoked.     Resp Syncytial Virus by PCR NEGATIVE NEGATIVE Final    Comment: (NOTE) Fact Sheet for Patients: BloggerCourse.com  Fact Sheet for Healthcare Providers: SeriousBroker.it  This test is not yet approved or cleared by the [place]  FDA and has been authorized for detection and/or diagnosis of SARS-CoV-2 by FDA under an Emergency Use Authorization (EUA). This EUA will remain in effect (meaning this test can be used) for the duration of the COVID-19 declaration under Section 564(b)(1) of the Act, 21 U.S.C. section 360bbb-3(b)(1), unless the authorization is terminated or revoked.  Performed at [facility], [address], [place], [place] [address]   Resp panel by RT-PCR (RSV, Flu A&B, Covid) Anterior Nasal Swab     Status: None   Collection Time: 10/30/23 12:48 AM   Specimen: Anterior Nasal Swab  Result Value Ref Range Status   SARS Coronavirus 2 by RT PCR NEGATIVE NEGATIVE Final   Influenza A by PCR NEGATIVE NEGATIVE Final   Influenza B by PCR NEGATIVE NEGATIVE Final    Comment: (NOTE) The Xpert Xpress SARS-CoV-2/FLU/RSV plus assay is intended as an aid in the diagnosis of influenza from Nasopharyngeal swab specimens and should not be used as a sole basis for treatment. Nasal washings and aspirates are unacceptable for Xpert Xpress SARS-CoV-2/FLU/RSV testing.  Fact Sheet for Patients: BloggerCourse.com  Fact Sheet for Healthcare Providers: SeriousBroker.it  This test is not  yet approved or cleared by the [place]  FDA and has been authorized for detection and/or diagnosis of SARS-CoV-2 by FDA under an Emergency Use Authorization (EUA). This EUA will remain in effect (meaning this test can be used) for the duration of the COVID-19 declaration under Section 564(b)(1) of the Act, 21 U.S.C. section 360bbb-3(b)(1), unless the authorization is terminated or revoked.     Resp Syncytial Virus by PCR NEGATIVE NEGATIVE Final    Comment: (NOTE) Fact Sheet for Patients: BloggerCourse.com  Fact Sheet for Healthcare Providers: SeriousBroker.it  This test is not yet approved or cleared by the [place]  FDA and has been authorized for detection and/or diagnosis of SARS-CoV-2 by FDA under an Emergency Use Authorization (EUA). This EUA will remain in effect (meaning this test can be used) for the duration of the COVID-19 declaration under Section 564(b)(1) of the Act, 21 U.S.C. section 360bbb-3(b)(1), unless the authorization is terminated or revoked.  Performed at [facility], [address] [place], [place] [address]      Labs: Basic Metabolic Panel: Recent Labs  Lab 10/28/23 0205 10/29/23 0206 10/29/23 1840 10/30/23 0650 10/31/23 0432  NA 136 139 135 138 135  K 3.7 4.2 4.4 3.6 3.9  CL  100 103 102 102 102  CO2 23 26 19* 23 27  GLUCOSE 204* 141* 253* 117* 116*  BUN <5* 7 7 7 6   CREATININE 0.56 0.50 0.71 0.59 0.56  CALCIUM  8.1* 8.3* 8.3* 8.5* 7.8*  MG  --   --   --  2.1  --   PHOS  --   --   --  3.6  --    Liver Function Tests: Recent Labs  Lab 10/25/23 0734 10/28/23 0205 10/29/23 1840  AST 29 26 36  ALT 19 17 23   ALKPHOS 96 93 88  BILITOT 1.0 0.6 0.4  PROT 6.4* 6.1* 7.0  ALBUMIN  2.9* 2.7* 3.1*   Recent Labs  Lab 10/25/23 0734 10/28/23 0205 10/29/23 1840  LIPASE 50 48 42   Recent Labs  Lab 10/30/23 0650  AMMONIA 29   CBC: Recent Labs  Lab 10/28/23 0205  10/29/23 0206 10/29/23 1840 10/30/23 0650 10/31/23 0432  WBC 7.6 6.4 9.0 9.5 5.7  NEUTROABS 6.0  --  7.9*  --  3.6  HGB 7.1* 7.5* 7.4* 7.2* 7.6*  HCT 25.2* 26.0* 26.3* 26.1* 26.8*  MCV 72.4* 72.8* 72.5* 72.9* 73.2*  PLT 171 PLATELET CLUMPS NOTED ON SMEAR, UNABLE TO ESTIMATE 248 228 PLATELET CLUMPS NOTED ON SMEAR, UNABLE TO ESTIMATE   Cardiac Enzymes: No results for input(s): "CKTOTAL", "CKMB", "CKMBINDEX", "TROPONINI" in the last 168 hours. BNP: BNP (last 3 results) Recent Labs    02/23/23 2151 06/11/23 1739 09/28/23 1753  BNP 350.8* 106.2* 39.2    ProBNP (last 3 results) No results for input(s): "PROBNP" in the last 8760 hours.  CBG: Recent Labs  Lab 10/29/23 1734  GLUCAP 268*    Signed:  Verlie Glisson MD   [facility] 10/31/2023, 1:54 PM

## 2023-11-02 LAB — T.PALLIDUM AB, TOTAL: T Pallidum Abs: REACTIVE — AB

## 2023-11-02 LAB — VITAMIN B1: Vitamin B1 (Thiamine): 169.8 nmol/L (ref 66.5–200.0)

## 2023-11-03 ENCOUNTER — Encounter (HOSPITAL_COMMUNITY): Payer: Self-pay | Admitting: Emergency Medicine

## 2023-11-03 ENCOUNTER — Emergency Department (HOSPITAL_COMMUNITY)
Admission: EM | Admit: 2023-11-03 | Discharge: 2023-11-03 | Disposition: A | Discharge status: Home / Self Care | Attending: Emergency Medicine | Admitting: Emergency Medicine

## 2023-11-03 ENCOUNTER — Emergency Department (HOSPITAL_COMMUNITY)

## 2023-11-03 ENCOUNTER — Other Ambulatory Visit: Payer: Self-pay

## 2023-11-03 DIAGNOSIS — Z7951 Long term (current) use of inhaled steroids: Secondary | ICD-10-CM | POA: Insufficient documentation

## 2023-11-03 DIAGNOSIS — F10939 Alcohol use, unspecified with withdrawal, unspecified: Secondary | ICD-10-CM | POA: Diagnosis not present

## 2023-11-03 DIAGNOSIS — R0602 Shortness of breath: Secondary | ICD-10-CM | POA: Insufficient documentation

## 2023-11-03 DIAGNOSIS — J449 Chronic obstructive pulmonary disease, unspecified: Secondary | ICD-10-CM | POA: Insufficient documentation

## 2023-11-03 DIAGNOSIS — R000 Tachycardia, unspecified: Secondary | ICD-10-CM | POA: Insufficient documentation

## 2023-11-03 DIAGNOSIS — R251 Tremor, unspecified: Secondary | ICD-10-CM | POA: Insufficient documentation

## 2023-11-03 DIAGNOSIS — F10231 Alcohol dependence with withdrawal delirium: Secondary | ICD-10-CM | POA: Diagnosis not present

## 2023-11-03 DIAGNOSIS — I10 Essential (primary) hypertension: Secondary | ICD-10-CM | POA: Insufficient documentation

## 2023-11-03 DIAGNOSIS — Y900 Blood alcohol level of less than 20 mg/100 ml: Secondary | ICD-10-CM | POA: Insufficient documentation

## 2023-11-03 LAB — CBC WITH DIFFERENTIAL/PLATELET
Abs Immature Granulocytes: 0.03 10*3/uL (ref 0.00–0.07)
Basophils Absolute: 0 10*3/uL (ref 0.0–0.1)
Basophils Relative: 0 %
Eosinophils Absolute: 0 10*3/uL (ref 0.0–0.5)
Eosinophils Relative: 0 %
HCT: 26.5 % — ABNORMAL LOW (ref 36.0–46.0)
Hemoglobin: 7.4 g/dL — ABNORMAL LOW (ref 12.0–15.0)
Immature Granulocytes: 0 %
Lymphocytes Relative: 18 %
Lymphs Abs: 1.6 10*3/uL (ref 0.7–4.0)
MCH: 20.2 pg — ABNORMAL LOW (ref 26.0–34.0)
MCHC: 27.9 g/dL — ABNORMAL LOW (ref 30.0–36.0)
MCV: 72.4 fL — ABNORMAL LOW (ref 80.0–100.0)
Monocytes Absolute: 0.9 10*3/uL (ref 0.1–1.0)
Monocytes Relative: 10 %
Neutro Abs: 6.1 10*3/uL (ref 1.7–7.7)
Neutrophils Relative %: 72 %
Platelets: 333 10*3/uL (ref 150–400)
RBC: 3.66 MIL/uL — ABNORMAL LOW (ref 3.87–5.11)
RDW: 21.1 % — ABNORMAL HIGH (ref 11.5–15.5)
Smear Review: NORMAL
WBC: 8.6 10*3/uL (ref 4.0–10.5)
nRBC: 0 % (ref 0.0–0.2)

## 2023-11-03 LAB — COMPREHENSIVE METABOLIC PANEL WITH GFR
ALT: 24 U/L (ref 0–44)
AST: 49 U/L — ABNORMAL HIGH (ref 15–41)
Albumin: 2.7 g/dL — ABNORMAL LOW (ref 3.5–5.0)
Alkaline Phosphatase: 77 U/L (ref 38–126)
Anion gap: 11 (ref 5–15)
BUN: 7 mg/dL (ref 6–20)
CO2: 21 mmol/L — ABNORMAL LOW (ref 22–32)
Calcium: 7.9 mg/dL — ABNORMAL LOW (ref 8.9–10.3)
Chloride: 102 mmol/L (ref 98–111)
Creatinine, Ser: 0.49 mg/dL (ref 0.44–1.00)
GFR, Estimated: >60 mL/min (ref >60)
Glucose, Bld: 142 mg/dL — ABNORMAL HIGH (ref 70–99)
Potassium: 4.4 mmol/L (ref 3.5–5.1)
Sodium: 134 mmol/L — ABNORMAL LOW (ref 135–145)
Total Bilirubin: 0.6 mg/dL (ref 0.0–1.2)
Total Protein: 6 g/dL — ABNORMAL LOW (ref 6.5–8.1)

## 2023-11-03 LAB — ETHANOL: Alcohol, Ethyl (B): <15 mg/dL (ref <15)

## 2023-11-03 LAB — RAPID URINE DRUG SCREEN, HOSP PERFORMED
Amphetamines: NOT DETECTED
Barbiturates: NOT DETECTED
Benzodiazepines: NOT DETECTED
Cocaine: NOT DETECTED
Opiates: NOT DETECTED
Tetrahydrocannabinol: NOT DETECTED

## 2023-11-03 LAB — TROPONIN I (HIGH SENSITIVITY): Troponin I (High Sensitivity): 4 ng/L (ref <18)

## 2023-11-03 MED ORDER — LORAZEPAM 1 MG PO TABS
1.0000 mg | ORAL_TABLET | Freq: Once | ORAL | Status: AC
  Administered 2023-11-03: 1 mg via ORAL
  Filled 2023-11-03: qty 1

## 2023-11-03 MED ORDER — ONDANSETRON 4 MG PO TBDP
4.0000 mg | ORAL_TABLET | Freq: Once | ORAL | Status: AC
  Administered 2023-11-03: 4 mg via ORAL
  Filled 2023-11-03: qty 1

## 2023-11-03 MED ORDER — SODIUM CHLORIDE 0.9 % IV BOLUS
1000.0000 mL | Freq: Once | INTRAVENOUS | Status: AC
  Administered 2023-11-03: 1000 mL via INTRAVENOUS

## 2023-11-03 NOTE — ED Notes (Signed)
 Pt continues to walk back and forth to the bathroom

## 2023-11-03 NOTE — ED Provider Notes (Addendum)
 Patient's care assumed by me at 6:30 AM.  Patient presents to the emergency department complaining of feeling short of breath and shaky.  Patient has a past medical history of alcohol  abuse COPD atrial fibs hypertension and diabetes.  Patient is pending IV fluids and labs.  Patient required 3 blood draws in order to obtain a CBC.  Lab reported first 2 tubes as hemolyzed and they could not run.  Laboratory evaluation returned patient's hemoglobin is 7.4 this is at her baseline.  Glucose is 142 sodium is 134.  Patient is given IV fluids Zofran .  Patient was given Ativan  at 5:20 AM.  Patient has been able to eat and drink without difficulty patient has been ambulating in the emergency department without difficulty.  Patient is advised of results she is discharged in stable condition.   Sandi Crosby, PA-C 11/03/23 1103    Avant Printy K, PA-C 11/03/23 1118    Dorenda Gandy, MD 11/03/23 1610

## 2023-11-03 NOTE — Discharge Instructions (Signed)
 Return if any problems.

## 2023-11-03 NOTE — ED Notes (Signed)
 Patient placed on 3 LPM via [place] for comfort, O2 saturation 98%.

## 2023-11-03 NOTE — ED Notes (Signed)
 Patient given sandwich bag and beverage, states no other needs at this time.

## 2023-11-03 NOTE — ED Provider Notes (Signed)
 [place] EMERGENCY DEPARTMENT AT [facility] Provider Note   CSN: [id number] Arrival date & time: 11/03/23  0414     History  Chief Complaint  Patient presents with   Shortness of Breath    MARGARETTA CHEFF is a 58 y.o. female.  The history is provided by the patient and medical records.  Shortness of Breath  58 y.o. F with hx of alcohol  abuse, polysubstance abuse, dysphagia, HTN, GERD, PAF not on anticoagulation, seizure disorder, presenting to the ED with multiple complaints.  Patient states overall she just does not feel very well.  States she has been feeling short of breath, shaky, and has been having some chest pain.  States it is left-sided without radiation.  She has been shaky since yesterday afternoon.  She has not had any alcohol  since early yesterday afternoon either.  Denies drug use.  She is chronically on home O2, currently on 3L (uses up to 5L at home) and is stable on this.  Home Medications Prior to Admission medications   Medication Sig Start Date End Date Taking? Authorizing Provider  albuterol  (VENTOLIN  HFA) 108 (90 Base) MCG/ACT inhaler Inhale 1-2 puffs into the lungs every 6 (six) hours as needed for wheezing or shortness of breath. 10/29/23   Palumbo, April, MD  ARIPiprazole  (ABILIFY ) 10 MG tablet Take 1 tablet (10 mg total) by mouth daily. 11/01/23   Samtani, Jai-Gurmukh, MD  doxycycline  (VIBRA -TABS) 100 MG tablet Take 1 tablet (100 mg total) by mouth 2 (two) times daily. 10/31/23   Samtani, Jai-Gurmukh, MD  ipratropium-albuterol  (DUONEB) 0.5-2.5 (3) MG/3ML SOLN Take 3 mLs by nebulization every 6 (six) hours as needed (for shortness of breath or wheezing).    [provider]  levETIRAcetam  (KEPPRA  XR) 500 MG 24 hr tablet Take 1 tablet (500 mg total) by mouth 2 (two) times daily. 10/31/23   Samtani, Jai-Gurmukh, MD  metFORMIN  (GLUCOPHAGE -XR) 750 MG 24 hr tablet Take 1 tablet (750 mg total) by mouth daily with breakfast. 10/31/23   Samtani,  Jai-Gurmukh, MD  nicotine  (NICODERM CQ  - DOSED IN MG/24 HOURS) 21 mg/24hr patch Place 1 patch (21 mg total) onto the skin daily. Patient not taking: Reported on 09/15/2023 06/16/23   Onuoha, Josephine C, NP  OXYGEN  Inhale 4-5 L/min into the lungs as needed (for COPD-related symptoms).    [provider]  zonisamide  (ZONEGRAN ) 100 MG capsule Take 2 capsules (200 mg total) by mouth daily. 10/31/23   Samtani, Jai-Gurmukh, MD      Allergies    Iron  dextran, Aspirin , and Penicillins    Review of Systems   Review of Systems  Respiratory:  Positive for shortness of breath.   All other systems reviewed and are negative.   Physical Exam Updated Vital Signs BP 125/74   Pulse (!) 115   Temp 98 F (36.7 C)   Resp 20   Ht 5\' 2"  (1.575 m)   Wt 59.7 kg   LMP 01/08/2011   SpO2 97%   BMI 24.07 kg/m   Physical Exam Vitals and nursing note reviewed.  Constitutional:      Appearance: She is well-developed.  HENT:     Head: Normocephalic and atraumatic.  Eyes:     Conjunctiva/sclera: Conjunctivae normal.     Pupils: Pupils are equal, round, and reactive to light.  Cardiovascular:     Rate and Rhythm: Normal rate and regular rhythm.     Heart sounds: Normal heart sounds.  Pulmonary:     Effort: Pulmonary  effort is normal.     Breath sounds: Normal breath sounds.  Abdominal:     General: Bowel sounds are normal.     Palpations: Abdomen is soft.  Musculoskeletal:        General: Normal range of motion.     Cervical back: Normal range of motion.  Skin:    General: Skin is warm and dry.  Neurological:     Mental Status: She is alert and oriented to person, place, and time.     Comments: AAOx3, able to answer questions and follow commands, tremulous on exam but no seizure activity witnessed     ED Results / Procedures / Treatments   Labs (all labs ordered are listed, but only abnormal results are displayed) Labs Reviewed  CBC WITH DIFFERENTIAL/PLATELET  COMPREHENSIVE  METABOLIC PANEL WITH GFR  ETHANOL  RAPID URINE DRUG SCREEN, HOSP PERFORMED  TROPONIN I (HIGH SENSITIVITY)    EKG None  Radiology DG Chest 2 View Result Date: 11/03/2023 CLINICAL DATA:  58 year old female with chest pain and shortness of breath. EXAM: CHEST - 2 VIEW COMPARISON:  Portable chest 10/30/2023 and earlier. FINDINGS: AP and lateral views 0549 hours. Lung volumes are stable. Mediastinal contours remain normal. Visualized tracheal air column is within normal limits. No pneumothorax, pulmonary edema, pleural effusion or consolidation. No confluent lung opacity. Chronic thoracic compression fractures better demonstrated by CT in January. No acute osseous abnormality identified. Negative visible bowel gas. IMPRESSION: 1.  No acute cardiopulmonary abnormality. 2. Chronic thoracic compression fractures. Electronically Signed   By: Marlise Simpers M.D.   On: 11/03/2023 06:12    Procedures Procedures    Medications Ordered in ED Medications - No data to display  ED Course/ Medical Decision Making/ A&P                                 Medical Decision Making Amount and/or Complexity of Data Reviewed Labs: ordered. Radiology: ordered and independent interpretation performed.  Risk Prescription drug management.   58 year old female presenting to the ED with various complaints--shortness of breath, chest pain, feeling shaky.  Known history of alcohol  abuse, has not drank since early yesterday afternoon.    She is awake, alert, oriented.  She is tachycardic and appears tremulous on exam.  She does not have any focal seizure activity.  Her lungs are grossly clear without any noted wheezes or rhonchi.  She is in no acute distress.  Patient generally requires home O2, anywhere from 2 to 5 L.  She was put on 3 L here but has been taking this off and walking around without apparent difficulty.  CIWA score is 13 but difficulty with IV access.  She is given oral Ativan  for now.  Labs and chest x-ray  are pending.  CXR without acute findings.  Labs still pending-- difficulty with IV access.  ED staff attempted x2.  IV team consult placed.  Ativan  seems to be helping.  Care signed out to oncoming provider at shift change.  Final Clinical Impression(s) / ED Diagnoses Final diagnoses:  SOB (shortness of breath)  Tremulousness    Rx / DC Orders ED Discharge Orders     None         Coretha Dew, PA-C 11/03/23 8119    Earma Gloss, MD 11/03/23 [phone]

## 2023-11-03 NOTE — ED Notes (Signed)
 Pt continues to walk back and forth to the bathroom stating she can not "go."  Pt will not leave monitoring on.

## 2023-11-03 NOTE — ED Triage Notes (Signed)
 Pt BIB EMS from home with c/o [facility], wears 5lpm chronically.   O2 97% on 5lpm 115HR 138/64  22RR

## 2023-11-03 NOTE — ED Notes (Signed)
 Pt has been offered and asked for food several times. She has been eating and drinking with no issues

## 2023-11-04 ENCOUNTER — Other Ambulatory Visit: Payer: Self-pay

## 2023-11-04 ENCOUNTER — Encounter (HOSPITAL_COMMUNITY): Payer: Self-pay

## 2023-11-04 ENCOUNTER — Emergency Department (HOSPITAL_COMMUNITY)
Admission: EM | Admit: 2023-11-04 | Discharge: 2023-11-04 | Disposition: A | Discharge status: Home / Self Care | Source: Home / Self Care | Attending: Emergency Medicine | Admitting: Emergency Medicine

## 2023-11-04 ENCOUNTER — Emergency Department (HOSPITAL_COMMUNITY)

## 2023-11-04 ENCOUNTER — Inpatient Hospital Stay (HOSPITAL_COMMUNITY)
Admission: EM | Admit: 2023-11-04 | Discharge: 2023-11-14 | DRG: 896 | Disposition: A | Discharge status: Home / Self Care | Attending: Internal Medicine | Admitting: Internal Medicine

## 2023-11-04 DIAGNOSIS — E1140 Type 2 diabetes mellitus with diabetic neuropathy, unspecified: Secondary | ICD-10-CM | POA: Diagnosis not present

## 2023-11-04 DIAGNOSIS — J9611 Chronic respiratory failure with hypoxia: Secondary | ICD-10-CM | POA: Diagnosis present

## 2023-11-04 DIAGNOSIS — Z886 Allergy status to analgesic agent status: Secondary | ICD-10-CM

## 2023-11-04 DIAGNOSIS — Z8249 Family history of ischemic heart disease and other diseases of the circulatory system: Secondary | ICD-10-CM

## 2023-11-04 DIAGNOSIS — Z7984 Long term (current) use of oral hypoglycemic drugs: Secondary | ICD-10-CM | POA: Insufficient documentation

## 2023-11-04 DIAGNOSIS — Z794 Long term (current) use of insulin: Secondary | ICD-10-CM | POA: Diagnosis not present

## 2023-11-04 DIAGNOSIS — I10 Essential (primary) hypertension: Secondary | ICD-10-CM | POA: Insufficient documentation

## 2023-11-04 DIAGNOSIS — I82622 Acute embolism and thrombosis of deep veins of left upper extremity: Secondary | ICD-10-CM | POA: Diagnosis present

## 2023-11-04 DIAGNOSIS — M79602 Pain in left arm: Secondary | ICD-10-CM

## 2023-11-04 DIAGNOSIS — I480 Paroxysmal atrial fibrillation: Secondary | ICD-10-CM | POA: Diagnosis present

## 2023-11-04 DIAGNOSIS — D649 Anemia, unspecified: Secondary | ICD-10-CM

## 2023-11-04 DIAGNOSIS — D509 Iron deficiency anemia, unspecified: Secondary | ICD-10-CM | POA: Diagnosis present

## 2023-11-04 DIAGNOSIS — M549 Dorsalgia, unspecified: Secondary | ICD-10-CM | POA: Diagnosis present

## 2023-11-04 DIAGNOSIS — K2920 Alcoholic gastritis without bleeding: Secondary | ICD-10-CM | POA: Diagnosis present

## 2023-11-04 DIAGNOSIS — G40909 Epilepsy, unspecified, not intractable, without status epilepticus: Secondary | ICD-10-CM | POA: Diagnosis present

## 2023-11-04 DIAGNOSIS — Z9884 Bariatric surgery status: Secondary | ICD-10-CM

## 2023-11-04 DIAGNOSIS — J449 Chronic obstructive pulmonary disease, unspecified: Secondary | ICD-10-CM | POA: Diagnosis present

## 2023-11-04 DIAGNOSIS — Z560 Unemployment, unspecified: Secondary | ICD-10-CM

## 2023-11-04 DIAGNOSIS — Z5329 Procedure and treatment not carried out because of patient's decision for other reasons: Secondary | ICD-10-CM | POA: Insufficient documentation

## 2023-11-04 DIAGNOSIS — Z781 Physical restraint status: Secondary | ICD-10-CM

## 2023-11-04 DIAGNOSIS — F10930 Alcohol use, unspecified with withdrawal, uncomplicated: Principal | ICD-10-CM

## 2023-11-04 DIAGNOSIS — E785 Hyperlipidemia, unspecified: Secondary | ICD-10-CM | POA: Diagnosis present

## 2023-11-04 DIAGNOSIS — Z880 Allergy status to penicillin: Secondary | ICD-10-CM

## 2023-11-04 DIAGNOSIS — E039 Hypothyroidism, unspecified: Secondary | ICD-10-CM | POA: Insufficient documentation

## 2023-11-04 DIAGNOSIS — K219 Gastro-esophageal reflux disease without esophagitis: Secondary | ICD-10-CM | POA: Diagnosis present

## 2023-11-04 DIAGNOSIS — Z79890 Hormone replacement therapy (postmenopausal): Secondary | ICD-10-CM

## 2023-11-04 DIAGNOSIS — F319 Bipolar disorder, unspecified: Secondary | ICD-10-CM | POA: Diagnosis present

## 2023-11-04 DIAGNOSIS — F17200 Nicotine dependence, unspecified, uncomplicated: Secondary | ICD-10-CM | POA: Insufficient documentation

## 2023-11-04 DIAGNOSIS — E119 Type 2 diabetes mellitus without complications: Secondary | ICD-10-CM

## 2023-11-04 DIAGNOSIS — F259 Schizoaffective disorder, unspecified: Secondary | ICD-10-CM | POA: Diagnosis present

## 2023-11-04 DIAGNOSIS — Z91148 Patient's other noncompliance with medication regimen for other reason: Secondary | ICD-10-CM

## 2023-11-04 DIAGNOSIS — E1142 Type 2 diabetes mellitus with diabetic polyneuropathy: Secondary | ICD-10-CM | POA: Diagnosis not present

## 2023-11-04 DIAGNOSIS — Z96641 Presence of right artificial hip joint: Secondary | ICD-10-CM | POA: Diagnosis present

## 2023-11-04 DIAGNOSIS — Z86711 Personal history of pulmonary embolism: Secondary | ICD-10-CM

## 2023-11-04 DIAGNOSIS — Z7151 Drug abuse counseling and surveillance of drug abuser: Secondary | ICD-10-CM

## 2023-11-04 DIAGNOSIS — G928 Other toxic encephalopathy: Secondary | ICD-10-CM | POA: Diagnosis present

## 2023-11-04 DIAGNOSIS — Z7289 Other problems related to lifestyle: Secondary | ICD-10-CM

## 2023-11-04 DIAGNOSIS — F1410 Cocaine abuse, uncomplicated: Secondary | ICD-10-CM | POA: Diagnosis present

## 2023-11-04 DIAGNOSIS — Z22322 Carrier or suspected carrier of Methicillin resistant Staphylococcus aureus: Secondary | ICD-10-CM

## 2023-11-04 DIAGNOSIS — Z90710 Acquired absence of both cervix and uterus: Secondary | ICD-10-CM

## 2023-11-04 DIAGNOSIS — Z79899 Other long term (current) drug therapy: Secondary | ICD-10-CM

## 2023-11-04 DIAGNOSIS — Z8619 Personal history of other infectious and parasitic diseases: Secondary | ICD-10-CM | POA: Insufficient documentation

## 2023-11-04 DIAGNOSIS — M7989 Other specified soft tissue disorders: Secondary | ICD-10-CM | POA: Diagnosis present

## 2023-11-04 DIAGNOSIS — Z713 Dietary counseling and surveillance: Secondary | ICD-10-CM

## 2023-11-04 DIAGNOSIS — Z7901 Long term (current) use of anticoagulants: Secondary | ICD-10-CM | POA: Diagnosis not present

## 2023-11-04 DIAGNOSIS — F10231 Alcohol dependence with withdrawal delirium: Secondary | ICD-10-CM | POA: Diagnosis present

## 2023-11-04 DIAGNOSIS — D500 Iron deficiency anemia secondary to blood loss (chronic): Secondary | ICD-10-CM | POA: Diagnosis not present

## 2023-11-04 DIAGNOSIS — Z825 Family history of asthma and other chronic lower respiratory diseases: Secondary | ICD-10-CM

## 2023-11-04 DIAGNOSIS — Z9981 Dependence on supplemental oxygen: Secondary | ICD-10-CM | POA: Diagnosis not present

## 2023-11-04 DIAGNOSIS — F17210 Nicotine dependence, cigarettes, uncomplicated: Secondary | ICD-10-CM | POA: Diagnosis present

## 2023-11-04 DIAGNOSIS — F419 Anxiety disorder, unspecified: Secondary | ICD-10-CM | POA: Diagnosis present

## 2023-11-04 DIAGNOSIS — F10931 Alcohol use, unspecified with withdrawal delirium: Secondary | ICD-10-CM | POA: Diagnosis not present

## 2023-11-04 DIAGNOSIS — Z888 Allergy status to other drugs, medicaments and biological substances status: Secondary | ICD-10-CM

## 2023-11-04 DIAGNOSIS — Z833 Family history of diabetes mellitus: Secondary | ICD-10-CM

## 2023-11-04 DIAGNOSIS — G8929 Other chronic pain: Secondary | ICD-10-CM | POA: Diagnosis present

## 2023-11-04 DIAGNOSIS — Z716 Tobacco abuse counseling: Secondary | ICD-10-CM

## 2023-11-04 DIAGNOSIS — Z9049 Acquired absence of other specified parts of digestive tract: Secondary | ICD-10-CM

## 2023-11-04 DIAGNOSIS — Z9152 Personal history of nonsuicidal self-harm: Secondary | ICD-10-CM

## 2023-11-04 DIAGNOSIS — F10939 Alcohol use, unspecified with withdrawal, unspecified: Secondary | ICD-10-CM | POA: Diagnosis present

## 2023-11-04 LAB — CBC WITH DIFFERENTIAL/PLATELET
Abs Immature Granulocytes: 0.04 10*3/uL (ref 0.00–0.07)
Basophils Absolute: 0 10*3/uL (ref 0.0–0.1)
Basophils Relative: 1 %
Eosinophils Absolute: 0 10*3/uL (ref 0.0–0.5)
Eosinophils Relative: 1 %
HCT: 24.1 % — ABNORMAL LOW (ref 36.0–46.0)
Hemoglobin: 6.6 g/dL — CL (ref 12.0–15.0)
Immature Granulocytes: 1 %
Lymphocytes Relative: 17 %
Lymphs Abs: 1.3 10*3/uL (ref 0.7–4.0)
MCH: 19.8 pg — ABNORMAL LOW (ref 26.0–34.0)
MCHC: 27.4 g/dL — ABNORMAL LOW (ref 30.0–36.0)
MCV: 72.4 fL — ABNORMAL LOW (ref 80.0–100.0)
Monocytes Absolute: 0.7 10*3/uL (ref 0.1–1.0)
Monocytes Relative: 9 %
Neutro Abs: 5.4 10*3/uL (ref 1.7–7.7)
Neutrophils Relative %: 71 %
Platelets: 325 10*3/uL (ref 150–400)
RBC: 3.33 MIL/uL — ABNORMAL LOW (ref 3.87–5.11)
RDW: 21.1 % — ABNORMAL HIGH (ref 11.5–15.5)
WBC: 7.5 10*3/uL (ref 4.0–10.5)
nRBC: 0 % (ref 0.0–0.2)

## 2023-11-04 LAB — COMPREHENSIVE METABOLIC PANEL WITH GFR
ALT: 22 U/L (ref 0–44)
AST: 47 U/L — ABNORMAL HIGH (ref 15–41)
Albumin: 2.6 g/dL — ABNORMAL LOW (ref 3.5–5.0)
Alkaline Phosphatase: 70 U/L (ref 38–126)
Anion gap: 5 (ref 5–15)
BUN: <5 mg/dL — ABNORMAL LOW (ref 6–20)
CO2: 27 mmol/L (ref 22–32)
Calcium: 7.7 mg/dL — ABNORMAL LOW (ref 8.9–10.3)
Chloride: 104 mmol/L (ref 98–111)
Creatinine, Ser: 0.51 mg/dL (ref 0.44–1.00)
GFR, Estimated: >60 mL/min (ref >60)
Glucose, Bld: 139 mg/dL — ABNORMAL HIGH (ref 70–99)
Potassium: 4.3 mmol/L (ref 3.5–5.1)
Sodium: 136 mmol/L (ref 135–145)
Total Bilirubin: 0.4 mg/dL (ref 0.0–1.2)
Total Protein: 5.9 g/dL — ABNORMAL LOW (ref 6.5–8.1)

## 2023-11-04 LAB — GLUCOSE, CAPILLARY
Glucose-Capillary: 118 mg/dL — ABNORMAL HIGH (ref 70–99)
Glucose-Capillary: 164 mg/dL — ABNORMAL HIGH (ref 70–99)
Glucose-Capillary: 94 mg/dL (ref 70–99)

## 2023-11-04 LAB — PREPARE RBC (CROSSMATCH)

## 2023-11-04 LAB — IRON AND TIBC
Iron: 12 ug/dL — ABNORMAL LOW (ref 28–170)
Saturation Ratios: 3 % — ABNORMAL LOW (ref 10.4–31.8)
TIBC: 483 ug/dL — ABNORMAL HIGH (ref 250–450)
UIBC: 471 ug/dL

## 2023-11-04 LAB — ETHANOL: Alcohol, Ethyl (B): <15 mg/dL (ref <15)

## 2023-11-04 LAB — LIPASE, BLOOD: Lipase: 50 U/L (ref 11–51)

## 2023-11-04 LAB — MRSA NEXT GEN BY PCR, NASAL: MRSA by PCR Next Gen: DETECTED — AB

## 2023-11-04 LAB — POC OCCULT BLOOD, ED: Fecal Occult Blood: NEGATIVE

## 2023-11-04 LAB — HEPARIN LEVEL (UNFRACTIONATED): Heparin Unfractionated: <0.1 [IU]/mL — ABNORMAL LOW (ref 0.30–0.70)

## 2023-11-04 LAB — FERRITIN: Ferritin: 5 ng/mL — ABNORMAL LOW (ref 11–307)

## 2023-11-04 MED ORDER — POLYETHYLENE GLYCOL 3350 17 G PO PACK: 17.0000 g | PACK | Freq: Every day | ORAL | Status: DC | PRN

## 2023-11-04 MED ORDER — THIAMINE MONONITRATE 100 MG PO TABS
100.0000 mg | ORAL_TABLET | ORAL | Status: DC
  Administered 2023-11-04 – 2023-11-14 (×9): 100 mg via ORAL
  Filled 2023-11-04 (×11): qty 1

## 2023-11-04 MED ORDER — INSULIN ASPART 100 UNIT/ML IJ SOLN
0.0000 [IU] | INTRAMUSCULAR | Status: DC
  Administered 2023-11-04: 3 [IU] via SUBCUTANEOUS
  Administered 2023-11-05: 2 [IU] via SUBCUTANEOUS
  Administered 2023-11-05 (×2): 3 [IU] via SUBCUTANEOUS
  Administered 2023-11-05: 2 [IU] via SUBCUTANEOUS

## 2023-11-04 MED ORDER — SODIUM CHLORIDE 0.9% IV SOLUTION
Freq: Once | INTRAVENOUS | Status: DC
Start: 2023-11-04 — End: 2023-11-04

## 2023-11-04 MED ORDER — IPRATROPIUM-ALBUTEROL 0.5-2.5 (3) MG/3ML IN SOLN
3.0000 mL | RESPIRATORY_TRACT | Status: DC | PRN
Start: 2023-11-04 — End: 2023-11-14
  Administered 2023-11-06 – 2023-11-11 (×5): 3 mL via RESPIRATORY_TRACT
  Filled 2023-11-04 (×6): qty 3

## 2023-11-04 MED ORDER — LORAZEPAM 2 MG/ML IJ SOLN: 1.0000 mg | INTRAMUSCULAR | Status: DC | PRN

## 2023-11-04 MED ORDER — ARIPIPRAZOLE 5 MG PO TABS
10.0000 mg | ORAL_TABLET | Freq: Every day | ORAL | Status: DC
  Administered 2023-11-04 – 2023-11-14 (×10): 10 mg via ORAL
  Filled 2023-11-04: qty 1
  Filled 2023-11-04: qty 2
  Filled 2023-11-04: qty 1
  Filled 2023-11-04 (×4): qty 2
  Filled 2023-11-04: qty 1
  Filled 2023-11-04 (×3): qty 2

## 2023-11-04 MED ORDER — THIAMINE MONONITRATE 100 MG PO TABS
100.0000 mg | ORAL_TABLET | Freq: Every day | ORAL | Status: DC
  Administered 2023-11-04: 100 mg via ORAL
  Filled 2023-11-04: qty 1

## 2023-11-04 MED ORDER — DOCUSATE SODIUM 100 MG PO CAPS: 100.0000 mg | ORAL_CAPSULE | Freq: Two times a day (BID) | ORAL | Status: DC | PRN

## 2023-11-04 MED ORDER — THIAMINE HCL 100 MG/ML IJ SOLN: 100.0000 mg | Freq: Every day | INTRAMUSCULAR | Status: DC

## 2023-11-04 MED ORDER — ZONISAMIDE 100 MG PO CAPS
200.0000 mg | ORAL_CAPSULE | Freq: Every day | ORAL | Status: DC
  Administered 2023-11-04 – 2023-11-14 (×10): 200 mg via ORAL
  Filled 2023-11-04 (×11): qty 2

## 2023-11-04 MED ORDER — NICOTINE 21 MG/24HR TD PT24
21.0000 mg | MEDICATED_PATCH | Freq: Every day | TRANSDERMAL | Status: DC
Start: 2023-11-04 — End: 2023-11-14
  Administered 2023-11-04 – 2023-11-13 (×9): 21 mg via TRANSDERMAL
  Filled 2023-11-04 (×11): qty 1

## 2023-11-04 MED ORDER — HEPARIN (PORCINE) 25000 UT/250ML-% IV SOLN
1150.0000 [IU]/h | INTRAVENOUS | Status: AC
  Administered 2023-11-04: 800 [IU]/h via INTRAVENOUS
  Administered 2023-11-05 – 2023-11-06 (×2): 1050 [IU]/h via INTRAVENOUS
  Filled 2023-11-04 (×3): qty 250

## 2023-11-04 MED ORDER — PHENOBARBITAL SODIUM 130 MG/ML IJ SOLN
97.5000 mg | Freq: Three times a day (TID) | INTRAMUSCULAR | Status: DC
  Administered 2023-11-04 – 2023-11-05 (×3): 97.5 mg via INTRAVENOUS
  Filled 2023-11-04 (×3): qty 1

## 2023-11-04 MED ORDER — BUDESONIDE 0.25 MG/2ML IN SUSP
0.2500 mg | Freq: Two times a day (BID) | RESPIRATORY_TRACT | Status: DC
  Administered 2023-11-04 – 2023-11-14 (×19): 0.25 mg via RESPIRATORY_TRACT
  Filled 2023-11-04 (×21): qty 2

## 2023-11-04 MED ORDER — FOLIC ACID 1 MG PO TABS
1.0000 mg | ORAL_TABLET | Freq: Every day | ORAL | Status: DC
  Administered 2023-11-04: 1 mg via ORAL
  Filled 2023-11-04: qty 1

## 2023-11-04 MED ORDER — DEXMEDETOMIDINE HCL IN NACL 400 MCG/100ML IV SOLN
0.0000 ug/kg/h | INTRAVENOUS | Status: DC
  Administered 2023-11-04: 0.4 ug/kg/h via INTRAVENOUS
  Filled 2023-11-04: qty 100

## 2023-11-04 MED ORDER — ADULT MULTIVITAMIN W/MINERALS CH
1.0000 | ORAL_TABLET | Freq: Every day | ORAL | Status: DC
  Administered 2023-11-04 – 2023-11-14 (×10): 1 via ORAL
  Filled 2023-11-04 (×11): qty 1

## 2023-11-04 MED ORDER — FOLIC ACID 1 MG PO TABS
1.0000 mg | ORAL_TABLET | Freq: Every day | ORAL | Status: DC
  Administered 2023-11-04 – 2023-11-14 (×10): 1 mg via ORAL
  Filled 2023-11-04 (×11): qty 1

## 2023-11-04 MED ORDER — THIAMINE HCL 100 MG/ML IJ SOLN
200.0000 mg | INTRAVENOUS | Status: DC
  Administered 2023-11-06: 200 mg via INTRAVENOUS
  Filled 2023-11-04 (×11): qty 2

## 2023-11-04 MED ORDER — PANTOPRAZOLE SODIUM 40 MG PO TBEC
40.0000 mg | DELAYED_RELEASE_TABLET | Freq: Every day | ORAL | Status: DC
  Administered 2023-11-04 – 2023-11-14 (×10): 40 mg via ORAL
  Filled 2023-11-04 (×11): qty 1

## 2023-11-04 MED ORDER — REVEFENACIN 175 MCG/3ML IN SOLN
175.0000 ug | Freq: Every day | RESPIRATORY_TRACT | Status: DC
  Administered 2023-11-04 – 2023-11-14 (×10): 175 ug via RESPIRATORY_TRACT
  Filled 2023-11-04 (×11): qty 3

## 2023-11-04 MED ORDER — PHENOBARBITAL SODIUM 130 MG/ML IJ SOLN: 65.0000 mg | Freq: Three times a day (TID) | INTRAMUSCULAR | Status: DC

## 2023-11-04 MED ORDER — LORAZEPAM 1 MG PO TABS
1.0000 mg | ORAL_TABLET | ORAL | Status: DC | PRN
  Administered 2023-11-04: 1 mg via ORAL
  Administered 2023-11-04: 3 mg via ORAL
  Administered 2023-11-04: 2 mg via ORAL
  Filled 2023-11-04: qty 2
  Filled 2023-11-04: qty 3
  Filled 2023-11-04: qty 1

## 2023-11-04 MED ORDER — LEVETIRACETAM ER 500 MG PO TB24
500.0000 mg | ORAL_TABLET | Freq: Two times a day (BID) | ORAL | Status: DC
  Administered 2023-11-04 – 2023-11-06 (×6): 500 mg via ORAL
  Filled 2023-11-04 (×8): qty 1

## 2023-11-04 MED ORDER — CHLORHEXIDINE GLUCONATE CLOTH 2 % EX PADS
6.0000 | MEDICATED_PAD | Freq: Every day | CUTANEOUS | Status: AC
  Administered 2023-11-05 – 2023-11-07 (×3): 6 via TOPICAL

## 2023-11-04 MED ORDER — LORAZEPAM 2 MG/ML IJ SOLN
1.0000 mg | INTRAMUSCULAR | Status: DC | PRN
  Administered 2023-11-04 – 2023-11-05 (×4): 1 mg via INTRAVENOUS
  Filled 2023-11-04 (×4): qty 1

## 2023-11-04 MED ORDER — SODIUM CHLORIDE 0.9% IV SOLUTION: Freq: Once | INTRAVENOUS | Status: DC

## 2023-11-04 MED ORDER — ARFORMOTEROL TARTRATE 15 MCG/2ML IN NEBU
15.0000 ug | INHALATION_SOLUTION | Freq: Two times a day (BID) | RESPIRATORY_TRACT | Status: DC
  Administered 2023-11-04 – 2023-11-14 (×19): 15 ug via RESPIRATORY_TRACT
  Filled 2023-11-04 (×21): qty 2

## 2023-11-04 MED ORDER — PHENOBARBITAL SODIUM 65 MG/ML IJ SOLN: 32.5000 mg | Freq: Three times a day (TID) | INTRAMUSCULAR | Status: DC

## 2023-11-04 MED ORDER — MUPIROCIN 2 % EX OINT
1.0000 | TOPICAL_OINTMENT | Freq: Two times a day (BID) | CUTANEOUS | Status: AC
  Administered 2023-11-04 – 2023-11-09 (×9): 1 via NASAL
  Filled 2023-11-04 (×2): qty 22

## 2023-11-04 NOTE — Progress Notes (Signed)
 PHARMACY - ANTICOAGULATION CONSULT NOTE  Pharmacy Consult for heparin  Indication: DVT  Allergies  Allergen Reactions   Iron  Dextran Shortness Of Breath and Anxiety   Aspirin  Nausea And Vomiting and Other (See Comments)    Ok to take tylenol  or ibuprofen     Penicillins Other (See Comments)    Unknown reaction from childhood    Patient Measurements: Height: 5\' 2"  (157.5 cm) Weight: 49 kg (108 lb) IBW/kg (Calculated) : 50.1 HEPARIN  DW (KG): 49  Vital Signs: Temp: 98.4 F (36.9 C) (05/14 0742) Temp Source: Oral (05/14 0409) BP: 139/97 (05/14 0943) Pulse Rate: 96 (05/14 0943)  Labs: Recent Labs    11/03/23 0640 11/03/23 0800 11/03/23 0844 11/04/23 0510  HGB  --  7.4*  --  6.6*  HCT  --  26.5*  --  24.1*  PLT  --  333  --  325  CREATININE 0.49  --   --  0.51  TROPONINIHS  --   --  4  --     Estimated Creatinine Clearance: 58.6 mL/min (by C-G formula based on SCr of 0.51 mg/dL).   Medical History: Past Medical History:  Diagnosis Date   Abdominal pain    Accidental drug overdose April 2013   Anxiety    Atrial fibrillation (HCC) 09/29/11   converted spontaneously   Chronic back pain    Chronic knee pain    Chronic nausea    Chronic pain    COPD (chronic obstructive pulmonary disease) (HCC)    Depression    Diabetes mellitus    states her doctor took her off all DM meds in past month   Diabetic neuropathy (HCC)    Dyspnea    with exertion    GERD (gastroesophageal reflux disease)    Headache(784.0)    migraines    HTN (hypertension)    not on meds since in a year    Hyperlipidemia    Hypothyroidism    not on meds in a while    Mental disorder    Bipolar and schizophrenic   Nausea and vomiting 01/02/2023   Requires supplemental oxygen     as needed per patient    Schizophrenia (HCC)    Schizophrenia, acute (HCC) 11/13/2017   Tobacco abuse     Assessment: [name] presenting with anemia, also swelling in left upper extremity, doppler positive for DVT,  workup for GI bleed negative and concern for iron  deficiency anemia, discussed with MD and will omit bolus and start conservative heparin  gtt  Goal of Therapy:  Heparin  level 0.3-0.5 units/ml Monitor platelets by anticoagulation protocol: Yes   Plan:  Heparin  gtt at 800 units/hr, no bolus F/u 6 hour heparin  level F/u long term AC plan  Trinidad Funk, PharmD, [facility] Clinical Pharmacist ED Pharmacist Phone # [phone] 11/04/2023 11:58 AM

## 2023-11-04 NOTE — ED Provider Notes (Addendum)
 [place] EMERGENCY DEPARTMENT AT [facility] Provider Note   CSN: [id number] Arrival date & time: 11/04/23  6962     History  Chief Complaint  Patient presents with   abnormal labs    [name] is a 58 y.o. female.  HPI   58 year old female presenting to the emergency department with a chief complaint of low hemoglobin.  The patient was just seen in the emergency department overnight and left AMA after it was recommended that she undergo blood transfusion.  Her hemoglobin this morning was notably 6.6.  She denies any dark sticky stools, is a chronic alcoholic, was found to have a microcytic anemia overnight.  Denies any shortness of breath, chest pain, lightheadedness.  She does state that she has had some swelling in her left upper extremity.  She presents now for blood transfusion as she is more amenable to it after talking with her daughter who is an employee of this institution.  Home Medications Prior to Admission medications   Medication Sig Start Date End Date Taking? Authorizing Provider  albuterol  (VENTOLIN  HFA) 108 (90 Base) MCG/ACT inhaler Inhale 1-2 puffs into the lungs every 6 (six) hours as needed for wheezing or shortness of breath. 10/29/23   Palumbo, April, MD  ARIPiprazole  (ABILIFY ) 10 MG tablet Take 1 tablet (10 mg total) by mouth daily. 11/01/23   Samtani, Jai-Gurmukh, MD  doxycycline  (VIBRA -TABS) 100 MG tablet Take 1 tablet (100 mg total) by mouth 2 (two) times daily. 10/31/23   Samtani, Jai-Gurmukh, MD  ipratropium-albuterol  (DUONEB) 0.5-2.5 (3) MG/3ML SOLN Take 3 mLs by nebulization every 6 (six) hours as needed (for shortness of breath or wheezing).    [provider]  levETIRAcetam  (KEPPRA  XR) 500 MG 24 hr tablet Take 1 tablet (500 mg total) by mouth 2 (two) times daily. 10/31/23   Samtani, Jai-Gurmukh, MD  metFORMIN  (GLUCOPHAGE -XR) 750 MG 24 hr tablet Take 1 tablet (750 mg total) by mouth daily with breakfast. 10/31/23   Samtani, Jai-Gurmukh,  MD  nicotine  (NICODERM CQ  - DOSED IN MG/24 HOURS) 21 mg/24hr patch Place 1 patch (21 mg total) onto the skin daily. Patient not taking: Reported on 09/15/2023 06/16/23   Onuoha, Josephine C, NP  OXYGEN  Inhale 4-5 L/min into the lungs as needed (for COPD-related symptoms).    [provider]  zonisamide  (ZONEGRAN ) 100 MG capsule Take 2 capsules (200 mg total) by mouth daily. 10/31/23   Samtani, Jai-Gurmukh, MD      Allergies    Iron  dextran, Aspirin , and Penicillins    Review of Systems   Review of Systems  All other systems reviewed and are negative.   Physical Exam Updated Vital Signs BP (!) 139/97   Pulse 96   Temp 98.4 F (36.9 C)   Resp 20   Ht 5\' 2"  (1.575 m)   Wt 49 kg   LMP 01/08/2011   SpO2 99%   BMI 19.75 kg/m  Physical Exam Vitals and nursing note reviewed.  Constitutional:      General: She is not in acute distress.    Appearance: She is well-developed.  HENT:     Head: Normocephalic and atraumatic.  Eyes:     Conjunctiva/sclera: Conjunctivae normal.  Cardiovascular:     Rate and Rhythm: Normal rate and regular rhythm.  Pulmonary:     Effort: Pulmonary effort is normal. No respiratory distress.     Breath sounds: Normal breath sounds.  Abdominal:     Palpations: Abdomen is soft.  Tenderness: There is no abdominal tenderness.  Genitourinary:    Rectum: Guaiac result negative.  Musculoskeletal:        General: Swelling present.     Cervical back: Neck supple.     Comments: Slight swelling of the left upper extremity compared to the right upper extremity  Skin:    General: Skin is warm and dry.     Capillary Refill: Capillary refill takes less than 2 seconds.  Neurological:     Mental Status: She is alert.  Psychiatric:        Mood and Affect: Mood is anxious.     ED Results / Procedures / Treatments   Labs (all labs ordered are listed, but only abnormal results are displayed) Labs Reviewed  IRON  AND TIBC - Abnormal; Notable for the  following components:      Result Value   Iron  12 (*)    TIBC 483 (*)    Saturation Ratios 3 (*)    All other components within normal limits  FERRITIN - Abnormal; Notable for the following components:   Ferritin 5 (*)    All other components within normal limits  POC OCCULT BLOOD, ED - Normal  TYPE AND SCREEN  PREPARE RBC (CROSSMATCH)    EKG None  Radiology UE Venous Duplex ([facility] and [facility] ONLY) Result Date: 11/04/2023 UPPER VENOUS STUDY  Patient Name:  [name]  Date of Exam:   11/04/2023 Medical Rec #: [id number]       Accession #:    [id number] Date of Birth: Apr 02, 1966      Patient Gender: F Patient Age:   58 years Exam Location:  [facility] Procedure:      VAS US  UPPER EXTREMITY VENOUS DUPLEX Referring Phys: Rosealee Concha --------------------------------------------------------------------------------  Indications: Pain, and Swelling Limitations: Patient movement. Comparison Study: No previous exams Performing Technologist: Jody Hill RVT, RDMS  Examination Guidelines: A complete evaluation includes B-mode imaging, spectral Doppler, color Doppler, and power Doppler as needed of all accessible portions of each vessel. Bilateral testing is considered an integral part of a complete examination. Limited examinations for reoccurring indications may be performed as noted.  Right Findings: +----------+------------+---------+-----------+----------+--------------------+ RIGHT     CompressiblePhasicitySpontaneousProperties      Summary        +----------+------------+---------+-----------+----------+--------------------+ Subclavian               Yes       Yes                   patent by                                                              color/doppler     +----------+------------+---------+-----------+----------+--------------------+  Left Findings: +----------+------------+---------+-----------+----------+--------------+ LEFT       CompressiblePhasicitySpontaneousProperties   Summary     +----------+------------+---------+-----------+----------+--------------+ IJV           Full       Yes       Yes                             +----------+------------+---------+-----------+----------+--------------+ Subclavian    Full       Yes       Yes                             +----------+------------+---------+-----------+----------+--------------+  Axillary      Full       Yes       Yes                             +----------+------------+---------+-----------+----------+--------------+ Brachial      Full       Yes       Yes                             +----------+------------+---------+-----------+----------+--------------+ Radial        Full                                                 +----------+------------+---------+-----------+----------+--------------+ Ulnar         None       No        No                   Acute      +----------+------------+---------+-----------+----------+--------------+ Cephalic                                            Not visualized +----------+------------+---------+-----------+----------+--------------+ Basilic       Full       Yes       Yes                             +----------+------------+---------+-----------+----------+--------------+  Summary:  Right: No evidence of thrombosis in the subclavian.  Left: No evidence of superficial vein thrombosis in the upper extremity. Findings consistent with acute deep vein thrombosis involving the left ulnar veins.  *See table(s) above for measurements and observations.     Preliminary    DG Chest Portable 1 View Result Date: 11/04/2023 CLINICAL DATA:  Shortness of breath. EXAM: PORTABLE CHEST 1 VIEW COMPARISON:  AP Lat chest yesterday at 5:50 a.m. FINDINGS: 4:29 a.m. Stable mild cardiomegaly. Central vessels are borderline prominent. No edema is seen. Increased linear atelectasis noted right lung base.  Relatively lower inspiration today but no focal pneumonia is evident. The sulci are sharp. The mediastinum is normally outlined. No new osseous abnormality. Telemetry leads overlie both sides of the chest. IMPRESSION: 1. Stable mild cardiomegaly. Borderline central vascular prominence without edema. 2. Increased linear atelectasis right lung base. Electronically Signed   By: Denman Fischer M.D.   On: 11/04/2023 05:06   DG Chest 2 View Result Date: 11/03/2023 CLINICAL DATA:  58 year old female with chest pain and shortness of breath. EXAM: CHEST - 2 VIEW COMPARISON:  Portable chest 10/30/2023 and earlier. FINDINGS: AP and lateral views 0549 hours. Lung volumes are stable. Mediastinal contours remain normal. Visualized tracheal air column is within normal limits. No pneumothorax, pulmonary edema, pleural effusion or consolidation. No confluent lung opacity. Chronic thoracic compression fractures better demonstrated by CT in January. No acute osseous abnormality identified. Negative visible bowel gas. IMPRESSION: 1.  No acute cardiopulmonary abnormality. 2. Chronic thoracic compression fractures. Electronically Signed   By: Marlise Simpers M.D.   On: 11/03/2023 06:12    Procedures .Critical Care  Performed by: Rosealee Concha, MD Authorized by: Rosealee Concha,  MD   Critical care provider statement:    Critical care time (minutes):  30   Critical care was time spent personally by me on the following activities:  Development of treatment plan with patient or surrogate, discussions with consultants, evaluation of patient's response to treatment, examination of patient, ordering and review of laboratory studies, ordering and review of radiographic studies, ordering and performing treatments and interventions, pulse oximetry, re-evaluation of patient's condition and review of old charts   Care discussed with: admitting provider       Medications Ordered in ED Medications  0.9 %  sodium chloride  infusion (Manually  program via Guardrails IV Fluids) (has no administration in time range)  LORazepam  (ATIVAN ) tablet 1-4 mg (3 mg Oral Given 11/04/23 0945)  thiamine  (VITAMIN B1) tablet 100 mg (100 mg Oral Given 11/04/23 0845)    Or  thiamine  (VITAMIN B1) injection 100 mg ( Intravenous See Alternative 11/04/23 0845)  folic acid  (FOLVITE ) tablet 1 mg (1 mg Oral Given 11/04/23 0845)    ED Course/ Medical Decision Making/ A&P                                 Medical Decision Making Amount and/or Complexity of Data Reviewed Labs: ordered.  Risk OTC drugs. Prescription drug management. Decision regarding hospitalization.    58 year old female presenting to the emergency department with a chief complaint of low hemoglobin.  The patient was just seen in the emergency department overnight and left AMA after it was recommended that she undergo blood transfusion.  Her hemoglobin this morning was notably 6.6.  She denies any dark sticky stools, is a chronic alcoholic, was found to have a microcytic anemia overnight.  Denies any shortness of breath, chest pain, lightheadedness.  She does state that she has had some swelling in her left upper extremity.  She presents now for blood transfusion as she is more amenable to it after talking with her daughter who is an employee of this institution.  On arrival, the patient was afebrile, mildly tachycardic heart rate 108, not tachypneic RR 18, BP 117/69, saturating her percent on room air.  Physical exam revealed left upper extremity swelling compared to the right, otherwise generally unremarkable.  Patient was mildly uncomfortable appearing in the ER.  Initial CIWA was notably 13 and the patient was placed on CIWA protocol and administered Ativan  due to concern for alcohol  withdrawal.  The patient states that she drinks 2x 40s daily.  She frequently presents to the emergency department for alcohol  related concerns.  Left upper extremity DVT ultrasounds performed which was positive  for DVT.  Heparin  was ordered per pharmacy protocol.  Given the patient's acute anemia, a fecal occult blood test was performed which was negative, the patient denies any hematemesis.  Low concern for acute GI bleed.  Patient's CBC was notably microcytic, favor likely iron  deficiency which was confirmed on iron  studies.  Patient was amenable to admission for her alcohol  withdrawal syndrome.  Administered benzodiazepines with a slightly climbing CIWA however do not believe that the patient meets criteria for ICU admission at this time.  Hospitalist medicine consulted for admission for management of alcohol  withdrawal, Dr. Jinwala accepting.  Pt progressing in her EtOH withdrawal, medicine recommending escalation to ICU admission. Recent CIWA downtrending to 12. Pt not agitated, CCM engaged by hospitalist to determine need for ICU admission vs step-down.   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

## 2023-11-04 NOTE — Progress Notes (Signed)
 PHARMACY - ANTICOAGULATION CONSULT NOTE  Pharmacy Consult for heparin  Indication: DVT  Allergies  Allergen Reactions   Iron  Dextran Shortness Of Breath and Anxiety   Aspirin  Nausea And Vomiting and Other (See Comments)    Ok to take tylenol  or ibuprofen     Penicillins Other (See Comments)    Unknown reaction from childhood    Patient Measurements: Height: 5\' 2"  (157.5 cm) Weight: 56.6 kg (124 lb 12.5 oz) IBW/kg (Calculated) : 50.1 HEPARIN  DW (KG): 49  Vital Signs: Temp: 98.5 F (36.9 C) (05/14 2012) Temp Source: Oral (05/14 2012) BP: 129/70 (05/14 2145) Pulse Rate: 89 (05/14 2115)  Labs: Recent Labs    11/03/23 0640 11/03/23 0800 11/03/23 0844 11/04/23 0510 11/04/23 2137  HGB  --  7.4*  --  6.6*  --   HCT  --  26.5*  --  24.1*  --   PLT  --  333  --  325  --   HEPARINUNFRC  --   --   --   --  <0.10*  CREATININE 0.49  --   --  0.51  --   TROPONINIHS  --   --  4  --   --     Estimated Creatinine Clearance: 59.9 mL/min (by C-G formula based on SCr of 0.51 mg/dL).   Medical History: Past Medical History:  Diagnosis Date   Abdominal pain    Accidental drug overdose April 2013   Anxiety    Atrial fibrillation (HCC) 09/29/11   converted spontaneously   Chronic back pain    Chronic knee pain    Chronic nausea    Chronic pain    COPD (chronic obstructive pulmonary disease) (HCC)    Depression    Diabetes mellitus    states her doctor took her off all DM meds in past month   Diabetic neuropathy (HCC)    Dyspnea    with exertion    GERD (gastroesophageal reflux disease)    Headache(784.0)    migraines    HTN (hypertension)    not on meds since in a year    Hyperlipidemia    Hypothyroidism    not on meds in a while    Mental disorder    Bipolar and schizophrenic   Nausea and vomiting 01/02/2023   Requires supplemental oxygen     as needed per patient    Schizophrenia (HCC)    Schizophrenia, acute (HCC) 11/13/2017   Tobacco abuse     Assessment: 71  YOF presenting with anemia, also swelling in left upper extremity, doppler positive for DVT, workup for GI bleed negative and concern for iron  deficiency anemia, discussed with MD and will omit bolus and start conservative heparin  gtt  HL <0.10- undetectable and subtherapeutic   Goal of Therapy:  Heparin  level 0.3-0.5 units/ml Monitor platelets by anticoagulation protocol: Yes   Plan:  Increase heparin  gtt to 1000 units/hr, no bolus F/u 6 hour heparin  level in AM Daily HL, CBC F/u long term AC plan  Oralee Billow, PharmD, BCCCP Clinical Pharmacist  10:13 PM

## 2023-11-04 NOTE — Discharge Instructions (Addendum)
 You were seen today for multiple complaints.  Your hemoglobin or red blood cell count is downtrending.  You need to follow-up with your primary doctor.  If you can tolerate iron , this will likely help.  You may need transfusion.  If you develop acute shortness of breath, chest pain, any new or worsening symptoms, you should be reevaluated.

## 2023-11-04 NOTE — ED Notes (Signed)
 Lunch tray ordered arrival time 2:50pm

## 2023-11-04 NOTE — Consult Note (Signed)
 Initial Consultation Note   Patient: [name]:[id number] DOB: May 06, 1966 PCP: Patient, No Pcp Per DOA: 11/04/2023 DOS: the patient was seen and examined on 11/04/2023 Primary service: Rosealee Concha, MD  Referring physician: Rosealee Concha, MD Reason for consult: Alcohol  withdrawals  Assessment/Plan: Assessment and Plan: No notes have been filed under this hospital service. Service: Hospitalist  Alcohol  withdrawals with delirium tremens Patient with significant daily alcohol  use. CIWA scores today 13 > 17 > 12, has required 6mg  IV ativan  in the past 3 hours. She is in DTs currently, noting "shadows", "a house", and hearing voices on exam. Last drink yesterday at 1200. Given progression of alcohol  withdrawals, I have discussed with PCCM who will admit patient to ICU. -CIWA with ativan  -folic acid , thiamine  -further care per PCCM  Acute on chronic anemia Iron  deficiency anemia -hgb 6.6 this morning -FOBT negative and no reports of bleeding per patient, low suspicion for GI bleed -1u PRBCs ordered by ED provider -iron  studies suggestive of severe IDA -further care per PCCM  LUE acute DVT -UE dopplers showing acute DVT in left ulnar veins -started on heparin  gtt per ED provider -further care per PCCM  pAFib -not on anticoagulation given significant fall risk  Greatly appreciate PCCM care. TRH can take over care once no longer requiring ICU level of care.  TRH will sign off at present, please call us  again when needed.   HPI: [name] is a 58 y.o. female with medical history significant of chronic anemia, iron  deficiency anemia, B12 deficiency, alcohol  use disorder with severe dependence, chronic pain, cocaine use disorder, COPD, HTN, T2DM, GERD, hypothyroidism, depression, paroxysmal atrial fibrillation not on anticoagulation with fall risk, hx of PE, seizure disorder presenting to the ED with left arm swelling and low hemoglobin.   Patient was evaluated in the ED  overnight with feeling of shakiness and left arm swelling. She was found to have hgb 6.6 (7.4 yesterday) and otherwise stable labs. She was advised to have a blood transfusion but ultimately left AMA. Her daughter (an employee at [employer]) advised her to return to the ED for further care. This prompted patient to return.  ED course: CBC earlier today with hgb 6.6 with elevated RDW and low MCV, otherwise stable. CMP earlier today with glucose 139, albumin  2.6 (chronically low), mildly elevated AST, normal kidney function. Lipase normal. Alcohol  level negative. Iron  12, TIBC 483, iron  saturation 3%, ferritin 5. FOBT negative. CXR with stable mild cardiomegaly and borderline central vascular prominence without edema. UE venous doppler showing acute DVT in left ulnar veins. Patient with CIWA scores 13 > 17, started on ativan  per CIWA protocol. 1u pRBC ordered. Patient started on heparin  gtt for DVT (given no findings of acute bleeding, low hgb presumably from severe iron  deficiency). TRH asked to evaluate patient for admission.   Review of Systems: As mentioned in the history of present illness. All other systems reviewed and are negative. Past Medical History:  Diagnosis Date   Abdominal pain    Accidental drug overdose April 2013   Anxiety    Atrial fibrillation (HCC) 09/29/11   converted spontaneously   Chronic back pain    Chronic knee pain    Chronic nausea    Chronic pain    COPD (chronic obstructive pulmonary disease) (HCC)    Depression    Diabetes mellitus    states her doctor took her off all DM meds in past month   Diabetic neuropathy (HCC)    Dyspnea  with exertion    GERD (gastroesophageal reflux disease)    Headache(784.0)    migraines    HTN (hypertension)    not on meds since in a year    Hyperlipidemia    Hypothyroidism    not on meds in a while    Mental disorder    Bipolar and schizophrenic   Nausea and vomiting 01/02/2023   Requires supplemental oxygen     as needed per  patient    Schizophrenia (HCC)    Schizophrenia, acute (HCC) 11/13/2017   Tobacco abuse    Past Surgical History:  Procedure Laterality Date   ABDOMINAL HYSTERECTOMY     BLADDER SUSPENSION  03/04/2011   Procedure: [facility] PROCEDURE;  Surgeon: Jackelyn Marvel MacDiarmid;  Location: [facility];  Service: Urology;  Laterality: N/A;   BOWEL RESECTION N/A 04/18/2022   Procedure: SMALL BOWEL RESECTION;  Surgeon: Sim Dryer, MD;  Location: [facility] OR;  Service: General;  Laterality: N/A;   CYSTOCELE REPAIR  03/04/2011   Procedure: ANTERIOR REPAIR (CYSTOCELE);  Surgeon: Devorah Fonder;  Location: [facility];  Service: Urology;  Laterality: N/A;   CYSTOSCOPY  03/04/2011   Procedure: CYSTOSCOPY;  Surgeon: Jackelyn Marvel MacDiarmid;  Location: [facility];  Service: Urology;  Laterality: N/A;   ESOPHAGOGASTRODUODENOSCOPY (EGD) WITH PROPOFOL  N/A 05/12/2017   Procedure: ESOPHAGOGASTRODUODENOSCOPY (EGD) WITH PROPOFOL ;  Surgeon: Juanita Norlander, MD;  Location: [facility];  Service: General;  Laterality: N/A;   GASTRIC ROUX-EN-Y N/A 03/25/2016   Procedure: LAPAROSCOPIC ROUX-EN-Y GASTRIC BYPASS WITH UPPER ENDOSCOPY;  Surgeon: Ayesha Lente, MD;  Location: [facility];  Service: General;  Laterality: N/A;   KNEE SURGERY     LAPAROSCOPIC ASSISTED VAGINAL HYSTERECTOMY  03/04/2011   Procedure: LAPAROSCOPIC ASSISTED VAGINAL HYSTERECTOMY;  Surgeon: Martine Sleek, MD;  Location: [facility];  Service: Gynecology;  Laterality: N/A;   LAPAROTOMY N/A 04/18/2022   Procedure: EXPLORATORY LAPAROTOMY;  Surgeon: Sim Dryer, MD;  Location: [facility];  Service: General;  Laterality: N/A;   LAPAROTOMY N/A 04/24/2022   Procedure: BRING BACK EXPLORATORY LAPAROTOMY;  Surgeon: Dorena Gander, MD;  Location[facility] OR;  Service: General;  Laterality: N/A;   TOTAL HIP ARTHROPLASTY Right 08/27/2022   Procedure: TOTAL HIP ARTHROPLASTY;  Surgeon: Murleen Arms, MD;  Location: [facility] OR;  Service: Orthopedics;  Laterality: Right;   Social History:  reports that she  has been smoking cigarettes. She has never used smokeless tobacco. She reports current alcohol  use. She reports current drug use. Drugs: Cocaine, Marijuana, and "Crack" cocaine.  Allergies  Allergen Reactions   Iron  Dextran Shortness Of Breath and Anxiety   Aspirin  Nausea And Vomiting and Other (See Comments)    Ok to take tylenol  or ibuprofen     Penicillins Other (See Comments)    Unknown reaction from childhood    Family History  Problem Relation Age of Onset   Heart attack Father        33s   Diabetes Mother    Heart disease Mother    Hypertension Mother    Heart attack Sister        39   COPD Other    Breast cancer Neg Hx     Prior to Admission medications   Medication Sig Start Date End Date Taking? Authorizing Provider  albuterol  (VENTOLIN  HFA) 108 (90 Base) MCG/ACT inhaler Inhale 1-2 puffs into the lungs every 6 (six) hours as needed for wheezing or shortness of breath. 10/29/23  Yes Palumbo, April, MD  ARIPiprazole  (ABILIFY ) 10 MG tablet Take 1  tablet (10 mg total) by mouth daily. 11/01/23  Yes Samtani, Jai-Gurmukh, MD  doxycycline  (VIBRA -TABS) 100 MG tablet Take 1 tablet (100 mg total) by mouth 2 (two) times daily. 10/31/23  Yes Samtani, Jai-Gurmukh, MD  ipratropium-albuterol  (DUONEB) 0.5-2.5 (3) MG/3ML SOLN Take 3 mLs by nebulization every 6 (six) hours as needed (for shortness of breath or wheezing).   Yes [provider]  levETIRAcetam  (KEPPRA  XR) 500 MG 24 hr tablet Take 1 tablet (500 mg total) by mouth 2 (two) times daily. 10/31/23  Yes Samtani, Jai-Gurmukh, MD  metFORMIN  (GLUCOPHAGE -XR) 750 MG 24 hr tablet Take 1 tablet (750 mg total) by mouth daily with breakfast. 10/31/23  Yes Samtani, Jai-Gurmukh, MD  zonisamide  (ZONEGRAN ) 100 MG capsule Take 2 capsules (200 mg total) by mouth daily. 10/31/23  Yes Samtani, Jai-Gurmukh, MD  nicotine  (NICODERM CQ  - DOSED IN MG/24 HOURS) 21 mg/24hr patch Place 1 patch (21 mg total) onto the skin daily. Patient not taking: Reported  on 09/15/2023 06/16/23   Onuoha, Josephine C, NP  OXYGEN  Inhale 4-5 L/min into the lungs as needed (for COPD-related symptoms).    [provider]    Physical Exam: Vitals:   11/04/23 0945 11/04/23 1155 11/04/23 1200 11/04/23 1205  BP: (!) 133/99  (!) 142/116 133/83  Pulse:  67 92 (!) 117  Resp:   18 (!) 25  Temp:   98.1 F (36.7 C)   SpO2: 99% 100% 99% 98%  Weight:      Height:       Physical Exam Constitutional:      Appearance: She is ill-appearing.  HENT:     Head: Normocephalic and atraumatic.     Mouth/Throat:     Mouth: Mucous membranes are dry.     Pharynx: Oropharynx is clear. No oropharyngeal exudate.  Eyes:     General: No scleral icterus.    Extraocular Movements: Extraocular movements intact.     Pupils: Pupils are equal, round, and reactive to light.  Cardiovascular:     Rate and Rhythm: Regular rhythm. Tachycardia present.     Heart sounds: Normal heart sounds. No murmur heard.    No friction rub. No gallop.  Pulmonary:     Effort: Pulmonary effort is normal. No respiratory distress.     Breath sounds: Normal breath sounds. No wheezing, rhonchi or rales.  Abdominal:     General: Bowel sounds are normal. There is no distension.     Palpations: Abdomen is soft.     Tenderness: There is no abdominal tenderness. There is no guarding or rebound.  Musculoskeletal:        General: Normal range of motion.     Cervical back: Normal range of motion.     Comments: No swelling noted  Skin:    General: Skin is warm and dry.  Neurological:     General: No focal deficit present.     Mental Status: She is alert.     Comments: Oriented to person, place, year, but not month. Patient tremulous on exam. She does have auditory and visual hallucinations on exam.  Psychiatric:     Comments: Calm     Data Reviewed:   There are no new results to review at this time.    Family Communication: no family at bedside Primary team communication: discussed with ED  provider and ICU team Thank you very much for involving us  in the care of your patient.  Author: Shayle Donahoo H Muna Demers, MD 11/04/2023 12:39 PM  For on  call review [email or web address].

## 2023-11-04 NOTE — ED Triage Notes (Addendum)
 Pt here for low HBG. Denies dizziness and weakness. C/O left breast pain. Denies blood in urine and stool

## 2023-11-04 NOTE — Progress Notes (Signed)
 LUE venous duplex has been completed.  Preliminary results given to Dr. Adrain Alar.   Results can be found under chart review under CV PROC. 11/04/2023 11:29 AM Khiree Bukhari RVT, RDMS

## 2023-11-04 NOTE — ED Notes (Signed)
 Patient refuses to stay for blood transfusion. Patient made aware of risks of leaving prior to dc. MD notified. AMA form signed by patient and witnessed by RN.

## 2023-11-04 NOTE — Progress Notes (Signed)
 CSW added substance abuse resources to patient's AVS.  Edwin Dada, MSW, LCSW Transitions of Care  Clinical Social Worker II [phone]

## 2023-11-04 NOTE — Hospital Course (Signed)
 Daily alcohol  -CIWA 17 -ativan    Swelling in left arm -DVT in LUE -heparin  gtt  Anemia -hgb 6.6 -iron  deficiency -getting 1u prbc

## 2023-11-04 NOTE — ED Triage Notes (Signed)
 BIB EMS/ pt c/o N/V, SHOB, cough, ABD pain/ pt is A&Ox4/ ambulatory/ seen at ED for same last night/ lung sounds clear/ 98% on 4L

## 2023-11-04 NOTE — ED Notes (Signed)
 Pt states last drink was yesterday, reports having only 1 beer

## 2023-11-04 NOTE — ED Notes (Signed)
 IV team at bedside

## 2023-11-04 NOTE — H&P (View-Only) (Signed)
 NAME:  [name], MRN:  [id number], DOB:  12-22-1965, LOS: 0 ADMISSION DATE:  11/04/2023, CONSULTATION DATE:  5/14 REFERRING MD:  Consuela Denier, hospitalist CHIEF COMPLAINT:  alcohol  withdrawal   History of Present Illness:  58 year old female with significant past medical history including anxiety and depression, chronic pain, COPD, tobacco use 1 pack/day, COPD, schizophrenia, hypothyroid, hypertension, hyperlipidemia, GERD, diabetes, chronic anemia, PAF not anticoagulated with fall risk, history of PE, cocaine use disorder, alcohol  use disorder, seizure disorder versus alcohol  withdrawal seizure history?  Who presented to the emergency department with low hemoglobin.  She was seen last night in the emergency department for low hemoglobin and left AMA prior to receiving any transfusion.  She returned this morning with same complaint.  Also noting left upper extremity swelling and alcohol  withdrawal.  Endorses 2 to 3, 40 ounce beers daily.  Last drink yesterday at unknown time.  Labs notable for hemoglobin of 6.6, microcytic, albumin  of 2.6, AST mildly elevated at 47, ethanol negative, iron  panel send confirming iron  deficiency anemia.  Had left upper extremity DVT study which was positive for DVT.  She was started on a heparin  drip.  Additionally noted higher CIWA scores up to 13.  She was started on CIWA protocol.  She was initially admitted to hospitalist.  Hospitalist consulted ICU over concern for hallucinations.  Had received total of 5 mg of Ativan  over 2 hours prior.   On my exam, patient is awake, alert, oriented.  She ambulated back from the bathroom.  She has grossly tremulous and alcohol  withdrawal.  I do not feel that she is in full-blown DTs at this time.  She received another milligram of Ativan  prior to my arrival.  She states last drink was yesterday.  She denies having any abdominal pain.  Denies taking aspirin , NSAIDs, Goody powders daily.  Denies any hematemesis hematochezia or  melena.  Pertinent  Medical History   anxiety and depression, chronic pain, COPD, tobacco use 1 pack/day, COPD, schizophrenia, hypothyroid, hypertension, hyperlipidemia, GERD, diabetes, chronic anemia, PAF not anticoagulated with fall risk, history of PE, cocaine use disorder, alcohol  use disorder, seizure disorder  Significant Hospital Events: Including procedures, antibiotic start and stop dates in addition to other pertinent events   5/14: Admit to ICU for alcohol  withdrawal  Interim History / Subjective:  No complaints on exam  Objective    Blood pressure 133/83, pulse (!) 117, temperature 98.1 F (36.7 C), resp. rate (!) 25, height 5\' 2"  (1.575 m), weight 49 kg, last menstrual period 01/08/2011, SpO2 98%.       No intake or output data in the 24 hours ending 11/04/23 1248 Filed Weights   11/04/23 0741  Weight: 49 kg    Examination: General: Middle-aged female, sitting in bed, ill-appearing but not an extremis HENT: NCAT, anicteric sclera, PERRLA Lungs: Clear bilaterally, room air, respirations are even and unlabored Cardiovascular: S1-S2, no murmur, mild tachycardia Abdomen: Rounded, soft, nontender and nondistended, +BS Extremities: No edema Neuro: Awake, alert, oriented.  Tongue fasciculations noted as well as gross tremor to the bilateral hands.  Not diaphoretic GU: No Foley  Resolved Hospital Problem list    Assessment & Plan:  Alcohol  withdrawal Alcohol  use disorder Seizure disorder versus alcohol  withdrawal seizure history Endorses 2-3, 40 ounce beers daily.  Last drink yesterday.  Ethanol level negative on arrival.  Frequent contact with healthcare for alcohol  related symptoms.  Unclear if her presentations for seizure or alcohol  withdrawal elated versus seizure disorder.  No seizures on current  presentation.  Initially given CIWA medications. -Start phenobarbital  -Continue CIWA Ativan  -Seizure precautions -Delirium precautions -Wernicke's thiamine  dosing,  folate, multivitamin -Restart home Keppra  500 mg twice daily, Zonegran  200 mg twice daily  Acute left upper extremity DVT DVT study positive for left ulnar DVT.  History of PE.  Not anticoagulated at home. -Heparin  drip  Symptomatic anemia Iron  deficiency Presenting with hemoglobin of 6.6.  No hematemesis, hematochezia, melena.  No ongoing aspirin , NSAID, Goody powder use.  She does have frequent encounters for abdominal pain and alcoholic gastritis without hemorrhage.  Iron  studies consistent with iron  deficiency anemia. -Type and screen -2 units PRBC transfusion -Trend CBC - consider starting iron  supplementation  -Even though it has a microcytic anemia, we will go ahead and check B12 and folate levels to optimize if needed -PPI -No indication for GI consultation at this time  COPD Ongoing tobacco use Continues 1 pack/day, appears to be on 4 to 5 L a minute as needed home O2 -Triple therapy -As needed DuoNeb -Nicotine  patch -O2 as needed as needed to keep SPO2 greater than 90% -Educate on cessation - IS, pulmonary toileting  Diabetes On metformin  at home - Follow-up A1c -SSI -CBG Q4  Paroxysmal atrial fibrillation not anticoagulated due to fall risk History of PE -Telemonitoring -On heparin  drip for DVT  Depression Anxiety Schizophrenia -Continue home Abilify   Hypothyroid TSH recently 2.345, 5 days ago -Not on home Synthroid  -Monitor  Hypertension Hyperlipidemia No home antihypertensives or statin - Follow-up lipid panel - Monitor BP  Best Practice (right click and "Reselect all SmartList Selections" daily)   Diet/type: Regular consistency (see orders) DVT prophylaxis: systemic heparin  GI prophylaxis: PPI Lines: N/A Foley:  N/A Code Status:  full code Last date of multidisciplinary goals of care discussion [5/14 update on plan of care at bedside]  Labs   CBC: Recent Labs  Lab 10/29/23 1840 10/30/23 0650 10/31/23 0432 11/03/23 0800  11/04/23 0510  WBC 9.0 9.5 5.7 8.6 7.5  NEUTROABS 7.9*  --  3.6 6.1 5.4  HGB 7.4* 7.2* 7.6* 7.4* 6.6*  HCT 26.3* 26.1* 26.8* 26.5* 24.1*  MCV 72.5* 72.9* 73.2* 72.4* 72.4*  PLT 248 228 PLATELET CLUMPS NOTED ON SMEAR, UNABLE TO ESTIMATE 333 325    Basic Metabolic Panel: Recent Labs  Lab 10/29/23 1840 10/30/23 0650 10/31/23 0432 11/03/23 0640 11/04/23 0510  NA 135 138 135 134* 136  K 4.4 3.6 3.9 4.4 4.3  CL 102 102 102 102 104  CO2 19* 23 27 21* 27  GLUCOSE 253* 117* 116* 142* 139*  BUN 7 7 6 7  <5*  CREATININE 0.71 0.59 0.56 0.49 0.51  CALCIUM  8.3* 8.5* 7.8* 7.9* 7.7*  MG  --  2.1  --   --   --   PHOS  --  3.6  --   --   --    GFR: Estimated Creatinine Clearance: 58.6 mL/min (by C-G formula based on SCr of 0.51 mg/dL). Recent Labs  Lab 10/30/23 0650 10/31/23 0432 11/03/23 0800 11/04/23 0510  WBC 9.5 5.7 8.6 7.5  LATICACIDVEN 3.1*  --   --   --     Liver Function Tests: Recent Labs  Lab 10/29/23 1840 11/03/23 0640 11/04/23 0510  AST 36 49* 47*  ALT 23 24 22   ALKPHOS 88 77 70  BILITOT 0.4 0.6 0.4  PROT 7.0 6.0* 5.9*  ALBUMIN  3.1* 2.7* 2.6*   Recent Labs  Lab 10/29/23 1840 11/04/23 0510  LIPASE 42 50   Recent Labs  Lab 10/30/23 0650  AMMONIA 29    ABG    Component Value Date/Time   PHART 7.277 (L) 11/20/2022 1640   PCO2ART 64.9 (H) 11/20/2022 1640   PO2ART 69 (L) 11/20/2022 1640   HCO3 19.9 (L) 01/25/2023 0427   TCO2 25 04/13/2023 0332   ACIDBASEDEF 3.0 (H) 01/25/2023 0427   O2SAT 100 01/25/2023 0427     Coagulation Profile: No results for input(s): "INR", "PROTIME" in the last 168 hours.  Cardiac Enzymes: No results for input(s): "CKTOTAL", "CKMB", "CKMBINDEX", "TROPONINI" in the last 168 hours.  HbA1C: Hgb A1c MFr Bld  Date/Time Value Ref Range Status  01/11/2023 09:00 AM 7.3 (H) 4.8 - 5.6 % Final    Comment:    (NOTE) Pre diabetes:          5.7%-6.4%  Diabetes:              >6.4%  Glycemic control for   <7.0% adults with  diabetes   09/19/2022 02:18 AM 7.3 (H) 4.8 - 5.6 % Final    Comment:    (NOTE)         Prediabetes: 5.7 - 6.4         Diabetes: >6.4         Glycemic control for adults with diabetes: <7.0     CBG: Recent Labs  Lab 10/29/23 1734  GLUCAP 268*    Review of Systems:   As above  Past Medical History:  She,  has a past medical history of Abdominal pain, Accidental drug overdose (April 2013), Anxiety, Atrial fibrillation (HCC) (09/29/11), Chronic back pain, Chronic knee pain, Chronic nausea, Chronic pain, COPD (chronic obstructive pulmonary disease) (HCC), Depression, Diabetes mellitus, Diabetic neuropathy (HCC), Dyspnea, GERD (gastroesophageal reflux disease), Headache(784.0), HTN (hypertension), Hyperlipidemia, Hypothyroidism, Mental disorder, Nausea and vomiting (01/02/2023), Requires supplemental oxygen , Schizophrenia (HCC), Schizophrenia, acute (HCC) (11/13/2017), and Tobacco abuse.   Surgical History:   Past Surgical History:  Procedure Laterality Date   ABDOMINAL HYSTERECTOMY     BLADDER SUSPENSION  03/04/2011   Procedure: [facility] PROCEDURE;  Surgeon: Jackelyn Marvel MacDiarmid;  Location: [facility];  Service: Urology;  Laterality: N/A;   BOWEL RESECTION N/A 04/18/2022   Procedure: SMALL BOWEL RESECTION;  Surgeon: Sim Dryer, MD;  Location: [facility] OR;  Service: General;  Laterality: N/A;   CYSTOCELE REPAIR  03/04/2011   Procedure: ANTERIOR REPAIR (CYSTOCELE);  Surgeon: Devorah Fonder;  Location: [facility];  Service: Urology;  Laterality: N/A;   CYSTOSCOPY  03/04/2011   Procedure: CYSTOSCOPY;  Surgeon: Jackelyn Marvel MacDiarmid;  Location: [facility];  Service: Urology;  Laterality: N/A;   ESOPHAGOGASTRODUODENOSCOPY (EGD) WITH PROPOFOL  N/A 05/12/2017   Procedure: ESOPHAGOGASTRODUODENOSCOPY (EGD) WITH PROPOFOL ;  Surgeon: Juanita Norlander, MD;  Location: [facility];  Service: General;  Laterality: N/A;   GASTRIC ROUX-EN-Y N/A 03/25/2016   Procedure: LAPAROSCOPIC ROUX-EN-Y GASTRIC BYPASS WITH UPPER ENDOSCOPY;   Surgeon: Ayesha Lente, MD;  Location: [facility];  Service: General;  Laterality: N/A;   KNEE SURGERY     LAPAROSCOPIC ASSISTED VAGINAL HYSTERECTOMY  03/04/2011   Procedure: LAPAROSCOPIC ASSISTED VAGINAL HYSTERECTOMY;  Surgeon: Martine Sleek, MD;  Location: [facility];  Service: Gynecology;  Laterality: N/A;   LAPAROTOMY N/A 04/18/2022   Procedure: EXPLORATORY LAPAROTOMY;  Surgeon: Sim Dryer, MD;  Location: [facility];  Service: General;  Laterality: N/A;   LAPAROTOMY N/A 04/24/2022   Procedure: BRING BACK EXPLORATORY LAPAROTOMY;  Surgeon: Dorena Gander, MD;  Location: [facility];  Service: General;  Laterality: N/A;   TOTAL HIP  ARTHROPLASTY Right 08/27/2022   Procedure: TOTAL HIP ARTHROPLASTY;  Surgeon: Murleen Arms, MD;  Location: [facility];  Service: Orthopedics;  Laterality: Right;     Social History:   reports that she has been smoking cigarettes. She has never used smokeless tobacco. She reports current alcohol  use. She reports current drug use. Drugs: Cocaine, Marijuana, and "Crack" cocaine.   Family History:  Her family history includes COPD in an other family member; Diabetes in her mother; Heart attack in her father and sister; Heart disease in her mother; Hypertension in her mother. There is no history of Breast cancer.   Allergies Allergies  Allergen Reactions   Iron  Dextran Shortness Of Breath and Anxiety   Aspirin  Nausea And Vomiting and Other (See Comments)    Ok to take tylenol  or ibuprofen     Penicillins Other (See Comments)    Unknown reaction from childhood     Home Medications  Prior to Admission medications   Medication Sig Start Date End Date Taking? Authorizing Provider  albuterol  (VENTOLIN  HFA) 108 (90 Base) MCG/ACT inhaler Inhale 1-2 puffs into the lungs every 6 (six) hours as needed for wheezing or shortness of breath. 10/29/23  Yes Palumbo, April, MD  ARIPiprazole  (ABILIFY ) 10 MG tablet Take 1 tablet (10 mg total) by mouth daily. 11/01/23  Yes Samtani,  Jai-Gurmukh, MD  doxycycline  (VIBRA -TABS) 100 MG tablet Take 1 tablet (100 mg total) by mouth 2 (two) times daily. 10/31/23  Yes Samtani, Jai-Gurmukh, MD  ipratropium-albuterol  (DUONEB) 0.5-2.5 (3) MG/3ML SOLN Take 3 mLs by nebulization every 6 (six) hours as needed (for shortness of breath or wheezing).   Yes [provider]  levETIRAcetam  (KEPPRA  XR) 500 MG 24 hr tablet Take 1 tablet (500 mg total) by mouth 2 (two) times daily. 10/31/23  Yes Samtani, Jai-Gurmukh, MD  metFORMIN  (GLUCOPHAGE -XR) 750 MG 24 hr tablet Take 1 tablet (750 mg total) by mouth daily with breakfast. 10/31/23  Yes Samtani, Jai-Gurmukh, MD  zonisamide  (ZONEGRAN ) 100 MG capsule Take 2 capsules (200 mg total) by mouth daily. 10/31/23  Yes Samtani, Jai-Gurmukh, MD  nicotine  (NICODERM CQ  - DOSED IN MG/24 HOURS) 21 mg/24hr patch Place 1 patch (21 mg total) onto the skin daily. Patient not taking: Reported on 09/15/2023 06/16/23   Onuoha, Josephine C, NP  OXYGEN  Inhale 4-5 L/min into the lungs as needed (for COPD-related symptoms).    [provider]     Critical care time: 93    Arlys Lamer [facility] 11/04/23 1:00 PM  Please see [email or web address] for pager details.  From 7A-7P if no response, please call [phone] After hours, please call ELink [phone]

## 2023-11-04 NOTE — Consult Note (Signed)
 NAME:  [name], MRN:  [id number], DOB:  12-22-1965, LOS: 0 ADMISSION DATE:  11/04/2023, CONSULTATION DATE:  5/14 REFERRING MD:  Consuela Denier, hospitalist CHIEF COMPLAINT:  alcohol  withdrawal   History of Present Illness:  58 year old female with significant past medical history including anxiety and depression, chronic pain, COPD, tobacco use 1 pack/day, COPD, schizophrenia, hypothyroid, hypertension, hyperlipidemia, GERD, diabetes, chronic anemia, PAF not anticoagulated with fall risk, history of PE, cocaine use disorder, alcohol  use disorder, seizure disorder versus alcohol  withdrawal seizure history?  Who presented to the emergency department with low hemoglobin.  She was seen last night in the emergency department for low hemoglobin and left AMA prior to receiving any transfusion.  She returned this morning with same complaint.  Also noting left upper extremity swelling and alcohol  withdrawal.  Endorses 2 to 3, 40 ounce beers daily.  Last drink yesterday at unknown time.  Labs notable for hemoglobin of 6.6, microcytic, albumin  of 2.6, AST mildly elevated at 47, ethanol negative, iron  panel send confirming iron  deficiency anemia.  Had left upper extremity DVT study which was positive for DVT.  She was started on a heparin  drip.  Additionally noted higher CIWA scores up to 13.  She was started on CIWA protocol.  She was initially admitted to hospitalist.  Hospitalist consulted ICU over concern for hallucinations.  Had received total of 5 mg of Ativan  over 2 hours prior.   On my exam, patient is awake, alert, oriented.  She ambulated back from the bathroom.  She has grossly tremulous and alcohol  withdrawal.  I do not feel that she is in full-blown DTs at this time.  She received another milligram of Ativan  prior to my arrival.  She states last drink was yesterday.  She denies having any abdominal pain.  Denies taking aspirin , NSAIDs, Goody powders daily.  Denies any hematemesis hematochezia or  melena.  Pertinent  Medical History   anxiety and depression, chronic pain, COPD, tobacco use 1 pack/day, COPD, schizophrenia, hypothyroid, hypertension, hyperlipidemia, GERD, diabetes, chronic anemia, PAF not anticoagulated with fall risk, history of PE, cocaine use disorder, alcohol  use disorder, seizure disorder  Significant Hospital Events: Including procedures, antibiotic start and stop dates in addition to other pertinent events   5/14: Admit to ICU for alcohol  withdrawal  Interim History / Subjective:  No complaints on exam  Objective    Blood pressure 133/83, pulse (!) 117, temperature 98.1 F (36.7 C), resp. rate (!) 25, height 5\' 2"  (1.575 m), weight 49 kg, last menstrual period 01/08/2011, SpO2 98%.       No intake or output data in the 24 hours ending 11/04/23 1248 Filed Weights   11/04/23 0741  Weight: 49 kg    Examination: General: Middle-aged female, sitting in bed, ill-appearing but not an extremis HENT: NCAT, anicteric sclera, PERRLA Lungs: Clear bilaterally, room air, respirations are even and unlabored Cardiovascular: S1-S2, no murmur, mild tachycardia Abdomen: Rounded, soft, nontender and nondistended, +BS Extremities: No edema Neuro: Awake, alert, oriented.  Tongue fasciculations noted as well as gross tremor to the bilateral hands.  Not diaphoretic GU: No Foley  Resolved Hospital Problem list    Assessment & Plan:  Alcohol  withdrawal Alcohol  use disorder Seizure disorder versus alcohol  withdrawal seizure history Endorses 2-3, 40 ounce beers daily.  Last drink yesterday.  Ethanol level negative on arrival.  Frequent contact with healthcare for alcohol  related symptoms.  Unclear if her presentations for seizure or alcohol  withdrawal elated versus seizure disorder.  No seizures on current  presentation.  Initially given CIWA medications. -Start phenobarbital  -Continue CIWA Ativan  -Seizure precautions -Delirium precautions -Wernicke's thiamine  dosing,  folate, multivitamin -Restart home Keppra  500 mg twice daily, Zonegran  200 mg twice daily  Acute left upper extremity DVT DVT study positive for left ulnar DVT.  History of PE.  Not anticoagulated at home. -Heparin  drip  Symptomatic anemia Iron  deficiency Presenting with hemoglobin of 6.6.  No hematemesis, hematochezia, melena.  No ongoing aspirin , NSAID, Goody powder use.  She does have frequent encounters for abdominal pain and alcoholic gastritis without hemorrhage.  Iron  studies consistent with iron  deficiency anemia. -Type and screen -2 units PRBC transfusion -Trend CBC - consider starting iron  supplementation  -Even though it has a microcytic anemia, we will go ahead and check B12 and folate levels to optimize if needed -PPI -No indication for GI consultation at this time  COPD Ongoing tobacco use Continues 1 pack/day, appears to be on 4 to 5 L a minute as needed home O2 -Triple therapy -As needed DuoNeb -Nicotine  patch -O2 as needed as needed to keep SPO2 greater than 90% -Educate on cessation - IS, pulmonary toileting  Diabetes On metformin  at home - Follow-up A1c -SSI -CBG Q4  Paroxysmal atrial fibrillation not anticoagulated due to fall risk History of PE -Telemonitoring -On heparin  drip for DVT  Depression Anxiety Schizophrenia -Continue home Abilify   Hypothyroid TSH recently 2.345, 5 days ago -Not on home Synthroid  -Monitor  Hypertension Hyperlipidemia No home antihypertensives or statin - Follow-up lipid panel - Monitor BP  Best Practice (right click and "Reselect all SmartList Selections" daily)   Diet/type: Regular consistency (see orders) DVT prophylaxis: systemic heparin  GI prophylaxis: PPI Lines: N/A Foley:  N/A Code Status:  full code Last date of multidisciplinary goals of care discussion [5/14 update on plan of care at bedside]  Labs   CBC: Recent Labs  Lab 10/29/23 1840 10/30/23 0650 10/31/23 0432 11/03/23 0800  11/04/23 0510  WBC 9.0 9.5 5.7 8.6 7.5  NEUTROABS 7.9*  --  3.6 6.1 5.4  HGB 7.4* 7.2* 7.6* 7.4* 6.6*  HCT 26.3* 26.1* 26.8* 26.5* 24.1*  MCV 72.5* 72.9* 73.2* 72.4* 72.4*  PLT 248 228 PLATELET CLUMPS NOTED ON SMEAR, UNABLE TO ESTIMATE 333 325    Basic Metabolic Panel: Recent Labs  Lab 10/29/23 1840 10/30/23 0650 10/31/23 0432 11/03/23 0640 11/04/23 0510  NA 135 138 135 134* 136  K 4.4 3.6 3.9 4.4 4.3  CL 102 102 102 102 104  CO2 19* 23 27 21* 27  GLUCOSE 253* 117* 116* 142* 139*  BUN 7 7 6 7  <5*  CREATININE 0.71 0.59 0.56 0.49 0.51  CALCIUM  8.3* 8.5* 7.8* 7.9* 7.7*  MG  --  2.1  --   --   --   PHOS  --  3.6  --   --   --    GFR: Estimated Creatinine Clearance: 58.6 mL/min (by C-G formula based on SCr of 0.51 mg/dL). Recent Labs  Lab 10/30/23 0650 10/31/23 0432 11/03/23 0800 11/04/23 0510  WBC 9.5 5.7 8.6 7.5  LATICACIDVEN 3.1*  --   --   --     Liver Function Tests: Recent Labs  Lab 10/29/23 1840 11/03/23 0640 11/04/23 0510  AST 36 49* 47*  ALT 23 24 22   ALKPHOS 88 77 70  BILITOT 0.4 0.6 0.4  PROT 7.0 6.0* 5.9*  ALBUMIN  3.1* 2.7* 2.6*   Recent Labs  Lab 10/29/23 1840 11/04/23 0510  LIPASE 42 50   Recent Labs  Lab 10/30/23 0650  AMMONIA 29    ABG    Component Value Date/Time   PHART 7.277 (L) 11/20/2022 1640   PCO2ART 64.9 (H) 11/20/2022 1640   PO2ART 69 (L) 11/20/2022 1640   HCO3 19.9 (L) 01/25/2023 0427   TCO2 25 04/13/2023 0332   ACIDBASEDEF 3.0 (H) 01/25/2023 0427   O2SAT 100 01/25/2023 0427     Coagulation Profile: No results for input(s): "INR", "PROTIME" in the last 168 hours.  Cardiac Enzymes: No results for input(s): "CKTOTAL", "CKMB", "CKMBINDEX", "TROPONINI" in the last 168 hours.  HbA1C: Hgb A1c MFr Bld  Date/Time Value Ref Range Status  01/11/2023 09:00 AM 7.3 (H) 4.8 - 5.6 % Final    Comment:    (NOTE) Pre diabetes:          5.7%-6.4%  Diabetes:              >6.4%  Glycemic control for   <7.0% adults with  diabetes   09/19/2022 02:18 AM 7.3 (H) 4.8 - 5.6 % Final    Comment:    (NOTE)         Prediabetes: 5.7 - 6.4         Diabetes: >6.4         Glycemic control for adults with diabetes: <7.0     CBG: Recent Labs  Lab 10/29/23 1734  GLUCAP 268*    Review of Systems:   As above  Past Medical History:  She,  has a past medical history of Abdominal pain, Accidental drug overdose (April 2013), Anxiety, Atrial fibrillation (HCC) (09/29/11), Chronic back pain, Chronic knee pain, Chronic nausea, Chronic pain, COPD (chronic obstructive pulmonary disease) (HCC), Depression, Diabetes mellitus, Diabetic neuropathy (HCC), Dyspnea, GERD (gastroesophageal reflux disease), Headache(784.0), HTN (hypertension), Hyperlipidemia, Hypothyroidism, Mental disorder, Nausea and vomiting (01/02/2023), Requires supplemental oxygen , Schizophrenia (HCC), Schizophrenia, acute (HCC) (11/13/2017), and Tobacco abuse.   Surgical History:   Past Surgical History:  Procedure Laterality Date   ABDOMINAL HYSTERECTOMY     BLADDER SUSPENSION  03/04/2011   Procedure: [facility] PROCEDURE;  Surgeon: Jackelyn Marvel MacDiarmid;  Location: [facility];  Service: Urology;  Laterality: N/A;   BOWEL RESECTION N/A 04/18/2022   Procedure: SMALL BOWEL RESECTION;  Surgeon: Sim Dryer, MD;  Location: [facility] OR;  Service: General;  Laterality: N/A;   CYSTOCELE REPAIR  03/04/2011   Procedure: ANTERIOR REPAIR (CYSTOCELE);  Surgeon: Devorah Fonder;  Location: [facility];  Service: Urology;  Laterality: N/A;   CYSTOSCOPY  03/04/2011   Procedure: CYSTOSCOPY;  Surgeon: Jackelyn Marvel MacDiarmid;  Location: [facility];  Service: Urology;  Laterality: N/A;   ESOPHAGOGASTRODUODENOSCOPY (EGD) WITH PROPOFOL  N/A 05/12/2017   Procedure: ESOPHAGOGASTRODUODENOSCOPY (EGD) WITH PROPOFOL ;  Surgeon: Juanita Norlander, MD;  Location: [facility];  Service: General;  Laterality: N/A;   GASTRIC ROUX-EN-Y N/A 03/25/2016   Procedure: LAPAROSCOPIC ROUX-EN-Y GASTRIC BYPASS WITH UPPER ENDOSCOPY;   Surgeon: Ayesha Lente, MD;  Location: [facility];  Service: General;  Laterality: N/A;   KNEE SURGERY     LAPAROSCOPIC ASSISTED VAGINAL HYSTERECTOMY  03/04/2011   Procedure: LAPAROSCOPIC ASSISTED VAGINAL HYSTERECTOMY;  Surgeon: Martine Sleek, MD;  Location: [facility];  Service: Gynecology;  Laterality: N/A;   LAPAROTOMY N/A 04/18/2022   Procedure: EXPLORATORY LAPAROTOMY;  Surgeon: Sim Dryer, MD;  Location: [facility];  Service: General;  Laterality: N/A;   LAPAROTOMY N/A 04/24/2022   Procedure: BRING BACK EXPLORATORY LAPAROTOMY;  Surgeon: Dorena Gander, MD;  Location: [facility];  Service: General;  Laterality: N/A;   TOTAL HIP  ARTHROPLASTY Right 08/27/2022   Procedure: TOTAL HIP ARTHROPLASTY;  Surgeon: Murleen Arms, MD;  Location: [facility];  Service: Orthopedics;  Laterality: Right;     Social History:   reports that she has been smoking cigarettes. She has never used smokeless tobacco. She reports current alcohol  use. She reports current drug use. Drugs: Cocaine, Marijuana, and "Crack" cocaine.   Family History:  Her family history includes COPD in an other family member; Diabetes in her mother; Heart attack in her father and sister; Heart disease in her mother; Hypertension in her mother. There is no history of Breast cancer.   Allergies Allergies  Allergen Reactions   Iron  Dextran Shortness Of Breath and Anxiety   Aspirin  Nausea And Vomiting and Other (See Comments)    Ok to take tylenol  or ibuprofen     Penicillins Other (See Comments)    Unknown reaction from childhood     Home Medications  Prior to Admission medications   Medication Sig Start Date End Date Taking? Authorizing Provider  albuterol  (VENTOLIN  HFA) 108 (90 Base) MCG/ACT inhaler Inhale 1-2 puffs into the lungs every 6 (six) hours as needed for wheezing or shortness of breath. 10/29/23  Yes Palumbo, April, MD  ARIPiprazole  (ABILIFY ) 10 MG tablet Take 1 tablet (10 mg total) by mouth daily. 11/01/23  Yes Samtani,  Jai-Gurmukh, MD  doxycycline  (VIBRA -TABS) 100 MG tablet Take 1 tablet (100 mg total) by mouth 2 (two) times daily. 10/31/23  Yes Samtani, Jai-Gurmukh, MD  ipratropium-albuterol  (DUONEB) 0.5-2.5 (3) MG/3ML SOLN Take 3 mLs by nebulization every 6 (six) hours as needed (for shortness of breath or wheezing).   Yes [provider]  levETIRAcetam  (KEPPRA  XR) 500 MG 24 hr tablet Take 1 tablet (500 mg total) by mouth 2 (two) times daily. 10/31/23  Yes Samtani, Jai-Gurmukh, MD  metFORMIN  (GLUCOPHAGE -XR) 750 MG 24 hr tablet Take 1 tablet (750 mg total) by mouth daily with breakfast. 10/31/23  Yes Samtani, Jai-Gurmukh, MD  zonisamide  (ZONEGRAN ) 100 MG capsule Take 2 capsules (200 mg total) by mouth daily. 10/31/23  Yes Samtani, Jai-Gurmukh, MD  nicotine  (NICODERM CQ  - DOSED IN MG/24 HOURS) 21 mg/24hr patch Place 1 patch (21 mg total) onto the skin daily. Patient not taking: Reported on 09/15/2023 06/16/23   Onuoha, Josephine C, NP  OXYGEN  Inhale 4-5 L/min into the lungs as needed (for COPD-related symptoms).    [provider]     Critical care time: 93    Arlys Lamer [facility] 11/04/23 1:00 PM  Please see [email or web address] for pager details.  From 7A-7P if no response, please call [phone] After hours, please call ELink [phone]

## 2023-11-04 NOTE — ED Notes (Signed)
 Patient given sandwich and beverage, states no other needs at this time.

## 2023-11-04 NOTE — ED Notes (Signed)
 Pt signed paper consent to receive blood transfusion

## 2023-11-04 NOTE — ED Provider Notes (Signed)
 [facility] EMERGENCY DEPARTMENT AT [facility] Provider Note   CSN: [id number] Arrival date & time: 11/04/23  0400     History  Chief Complaint  Patient presents with   Vomiting    [name] is a 58 y.o. female.  HPI     This is a 58 year old female well-known to our emergency department who presents with shortness of breath and nausea vomiting.  History of alcohol  abuse, COPD, atrial fibrillation, hypertension.  She is here quite frequently.  States that she developed some shortness of breath at home.  She wears home oxygen .  She has not had any recent fevers or chest pain.  She developed vomiting.  Denies any abdominal pain.  She was seen less than 24 hours ago for similar symptoms.  She does report drinking some alcohol  yesterday.  Home Medications Prior to Admission medications   Medication Sig Start Date End Date Taking? Authorizing Provider  albuterol  (VENTOLIN  HFA) 108 (90 Base) MCG/ACT inhaler Inhale 1-2 puffs into the lungs every 6 (six) hours as needed for wheezing or shortness of breath. 10/29/23   Palumbo, April, MD  ARIPiprazole  (ABILIFY ) 10 MG tablet Take 1 tablet (10 mg total) by mouth daily. 11/01/23   Samtani, Jai-Gurmukh, MD  doxycycline  (VIBRA -TABS) 100 MG tablet Take 1 tablet (100 mg total) by mouth 2 (two) times daily. 10/31/23   Samtani, Jai-Gurmukh, MD  ipratropium-albuterol  (DUONEB) 0.5-2.5 (3) MG/3ML SOLN Take 3 mLs by nebulization every 6 (six) hours as needed (for shortness of breath or wheezing).    [provider]  levETIRAcetam  (KEPPRA  XR) 500 MG 24 hr tablet Take 1 tablet (500 mg total) by mouth 2 (two) times daily. 10/31/23   Samtani, Jai-Gurmukh, MD  metFORMIN  (GLUCOPHAGE -XR) 750 MG 24 hr tablet Take 1 tablet (750 mg total) by mouth daily with breakfast. 10/31/23   Samtani, Jai-Gurmukh, MD  nicotine  (NICODERM CQ  - DOSED IN MG/24 HOURS) 21 mg/24hr patch Place 1 patch (21 mg total) onto the skin daily. Patient not taking: Reported on  09/15/2023 06/16/23   Onuoha, Josephine C, NP  OXYGEN  Inhale 4-5 L/min into the lungs as needed (for COPD-related symptoms).    [provider]  zonisamide  (ZONEGRAN ) 100 MG capsule Take 2 capsules (200 mg total) by mouth daily. 10/31/23   Samtani, Jai-Gurmukh, MD      Allergies    Iron  dextran, Aspirin , and Penicillins    Review of Systems   Review of Systems  Constitutional:  Negative for fever.  Respiratory:  Positive for shortness of breath.   Cardiovascular:  Negative for chest pain.  Gastrointestinal:  Positive for nausea and vomiting. Negative for abdominal pain.  All other systems reviewed and are negative.   Physical Exam Updated Vital Signs BP 131/67   Pulse (!) 102   Temp 98.6 F (37 C) (Oral)   Resp 19   Wt 60 kg   LMP 01/08/2011   SpO2 100%   BMI 24.19 kg/m  Physical Exam Vitals and nursing note reviewed.  Constitutional:      Appearance: She is well-developed.     Comments: Appears at her baseline  HENT:     Head: Normocephalic and atraumatic.  Eyes:     Pupils: Pupils are equal, round, and reactive to light.  Cardiovascular:     Rate and Rhythm: Regular rhythm. Tachycardia present.     Heart sounds: Normal heart sounds.  Pulmonary:     Effort: Pulmonary effort is normal. No respiratory distress.  Breath sounds: No wheezing.     Comments: Diminished breath sounds in the all lung fields, no active wheezing Abdominal:     Palpations: Abdomen is soft.     Tenderness: There is abdominal tenderness.  Musculoskeletal:     Cervical back: Neck supple.  Skin:    General: Skin is warm and dry.  Neurological:     Mental Status: She is alert and oriented to person, place, and time.     Comments: Tremulous  Psychiatric:        Mood and Affect: Mood normal.     ED Results / Procedures / Treatments   Labs (all labs ordered are listed, but only abnormal results are displayed) Labs Reviewed  CBC WITH DIFFERENTIAL/PLATELET - Abnormal; Notable for  the following components:      Result Value   RBC 3.33 (*)    Hemoglobin 6.6 (*)    HCT 24.1 (*)    MCV 72.4 (*)    MCH 19.8 (*)    MCHC 27.4 (*)    RDW 21.1 (*)    All other components within normal limits  COMPREHENSIVE METABOLIC PANEL WITH GFR - Abnormal; Notable for the following components:   Glucose, Bld 139 (*)    BUN <5 (*)    Calcium  7.7 (*)    Total Protein 5.9 (*)    Albumin  2.6 (*)    AST 47 (*)    All other components within normal limits  LIPASE, BLOOD  ETHANOL    EKG EKG Interpretation Date/Time:  Wednesday Nov 04 2023 04:28:04 EDT Ventricular Rate:  95 PR Interval:  128 QRS Duration:  80 QT Interval:  344 QTC Calculation: 433 R Axis:   52  Text Interpretation: Sinus rhythm Confirmed by Donita Furrow ([id number]) on 11/04/2023 5:42:42 AM  Radiology UE Venous Duplex ([facility] and [facility] ONLY) Result Date: 11/04/2023 UPPER VENOUS STUDY  Patient Name:  [name]  Date of Exam:   11/04/2023 Medical Rec #: [id number]       Accession #:    [id number] Date of Birth: [birth year]      Patient Gender: F Patient Age:   33 years Exam Location:  [facility] Procedure:      VAS US  UPPER EXTREMITY VENOUS DUPLEX Referring Phys: Rosealee Concha --------------------------------------------------------------------------------  Indications: Pain, and Swelling Limitations: Patient movement. Comparison Study: No previous exams Performing Technologist: Jody Hill RVT, RDMS  Examination Guidelines: A complete evaluation includes B-mode imaging, spectral Doppler, color Doppler, and power Doppler as needed of all accessible portions of each vessel. Bilateral testing is considered an integral part of a complete examination. Limited examinations for reoccurring indications may be performed as noted.  Right Findings: +----------+------------+---------+-----------+----------+--------------------+ RIGHT     CompressiblePhasicitySpontaneousProperties      Summary         +----------+------------+---------+-----------+----------+--------------------+ Subclavian               Yes       Yes                   patent by                                                              color/doppler     +----------+------------+---------+-----------+----------+--------------------+  Left Findings: +----------+------------+---------+-----------+----------+--------------+ LEFT  CompressiblePhasicitySpontaneousProperties   Summary     +----------+------------+---------+-----------+----------+--------------+ IJV           Full       Yes       Yes                             +----------+------------+---------+-----------+----------+--------------+ Subclavian    Full       Yes       Yes                             +----------+------------+---------+-----------+----------+--------------+ Axillary      Full       Yes       Yes                             +----------+------------+---------+-----------+----------+--------------+ Brachial      Full       Yes       Yes                             +----------+------------+---------+-----------+----------+--------------+ Radial        Full                                                 +----------+------------+---------+-----------+----------+--------------+ Ulnar         None       No        No                   Acute      +----------+------------+---------+-----------+----------+--------------+ Cephalic                                            Not visualized +----------+------------+---------+-----------+----------+--------------+ Basilic       Full       Yes       Yes                             +----------+------------+---------+-----------+----------+--------------+  Summary:  Right: No evidence of thrombosis in the subclavian.  Left: No evidence of superficial vein thrombosis in the upper extremity. Findings consistent with acute deep vein thrombosis involving the left  ulnar veins.  *See table(s) above for measurements and observations.  Diagnosing physician: Jimmye Moulds MD Electronically signed by Jimmye Moulds MD on 11/04/2023 at 2:17:10 PM.    Final    DG Chest Portable 1 View Result Date: 11/04/2023 CLINICAL DATA:  Shortness of breath. EXAM: PORTABLE CHEST 1 VIEW COMPARISON:  AP Lat chest yesterday at 5:50 a.m. FINDINGS: 4:29 a.m. Stable mild cardiomegaly. Central vessels are borderline prominent. No edema is seen. Increased linear atelectasis noted right lung base. Relatively lower inspiration today but no focal pneumonia is evident. The sulci are sharp. The mediastinum is normally outlined. No new osseous abnormality. Telemetry leads overlie both sides of the chest. IMPRESSION: 1. Stable mild cardiomegaly. Borderline central vascular prominence without edema. 2. Increased linear atelectasis right lung base. Electronically Signed   By: Denman Fischer M.D.   On: 11/04/2023 05:06  DG Chest 2 View Result Date: 11/03/2023 CLINICAL DATA:  58 year old female with chest pain and shortness of breath. EXAM: CHEST - 2 VIEW COMPARISON:  Portable chest 10/30/2023 and earlier. FINDINGS: AP and lateral views 0549 hours. Lung volumes are stable. Mediastinal contours remain normal. Visualized tracheal air column is within normal limits. No pneumothorax, pulmonary edema, pleural effusion or consolidation. No confluent lung opacity. Chronic thoracic compression fractures better demonstrated by CT in January. No acute osseous abnormality identified. Negative visible bowel gas. IMPRESSION: 1.  No acute cardiopulmonary abnormality. 2. Chronic thoracic compression fractures. Electronically Signed   By: Marlise Simpers M.D.   On: 11/03/2023 06:12    Procedures Procedures    Medications Ordered in ED Medications - No data to display  ED Course/ Medical Decision Making/ A&P Clinical Course as of 11/04/23 2314  Wed Nov 04, 2023  0644 Patient amenable to transfusion.  Typed and  screened.  1 unit ordered. [CH]  [id number] Informed by nursing that patient is now refusing transfusion.  She wants to be discharged.  This is fairly typical as she leaves AMA multiple times.  I discussed with her the importance of getting her hemoglobin rechecked and taking iron .  Will allow her to leave AMA but will prescribe iron . [CH]  4098 Patient is ambulatory in no acute distress and appears at her baseline. [CH]  0724 Patient has a listed allergy to iron .  Will have her follow-up with her primary doctor. [CH]    Clinical Course User Index [CH] Patsy Varma, Vonzella Guernsey, MD                                 Medical Decision Making Amount and/or Complexity of Data Reviewed Labs: ordered. Radiology: ordered.   This patient presents to the ED for concern of multiple complaints including shortness of breath, nausea, vomiting, this involves an extensive number of treatment options, and is a complaint that carries with it a high risk of complications and morbidity.  I considered the following differential and admission for this acute, potentially life threatening condition.  The differential diagnosis includes chronic respiratory failure, COPD exacerbation, pneumonia, pneumothorax, less likely ACS or PE, gastritis, gastroenteritis, cholecystitis, pancreatitis  MDM:    This is a 58 year old female who is well-known to our emergency department who presents with multiple complaints.  She is nontoxic-appearing.  Slightly tachycardic.  She is not in any respiratory distress.  She actually looks slightly better than her baseline as I have seen her multiple times.  She is awake alert.  She is slightly tremulous.  She has a history of alcohol  use and abuse.  Was recently admitted to the hospital.  EKG without acute ischemia or arrhythmia.  Chest x-ray without pneumothorax or pneumonia.  Other basic lab work notable for hemoglobin of 6.6.  This is slightly down trended from her baseline.  She has chronic anemia with a  baseline of 7.2-7.6.  She does not know her transfusion threshold but has previously declined transfusion.  Discussed with her that given she is slightly lower than her normal and is tachycardic with some shortness of breath, feel it reasonable that we transfuse.  Initially she consented; however was informed by nursing that she wanted to leave.  She is ambulatory independently and appears at her baseline.  She has an allergy to iron  listed.  Patient refuses transfusion.  Given absence of other emergent process, would recommend she follow-up closely with  her primary physician for recheck.  Patient signed out AGAINST MEDICAL ADVICE.  (Labs, imaging, consults)  Labs: I Ordered, and personally interpreted labs.  The pertinent results include: CBC, CMP, lipase EtOH  Imaging Studies ordered: I ordered imaging studies including chest x-ray I independently visualized and interpreted imaging. I agree with the radiologist interpretation  Additional history obtained from chart review.  External records from outside source obtained and reviewed including recent admission  Cardiac Monitoring: The patient was maintained on a cardiac monitor.  If on the cardiac monitor, I personally viewed and interpreted the cardiac monitored which showed an underlying rhythm of: Sinus tachycardia  Reevaluation: After the interventions noted above, I reevaluated the patient and found that they have :stayed the same  Social Determinants of Health:  history of schizophrenia and bipolar  Disposition: AMA  Co morbidities that complicate the patient evaluation  Past Medical History:  Diagnosis Date   Abdominal pain    Accidental drug overdose April 2013   Anxiety    Atrial fibrillation (HCC) 09/29/11   converted spontaneously   Chronic back pain    Chronic knee pain    Chronic nausea    Chronic pain    COPD (chronic obstructive pulmonary disease) (HCC)    Depression    Diabetes mellitus    states her doctor took  her off all DM meds in past month   Diabetic neuropathy (HCC)    Dyspnea    with exertion    GERD (gastroesophageal reflux disease)    Headache(784.0)    migraines    HTN (hypertension)    not on meds since in a year    Hyperlipidemia    Hypothyroidism    not on meds in a while    Mental disorder    Bipolar and schizophrenic   Nausea and vomiting 01/02/2023   Requires supplemental oxygen     as needed per patient    Schizophrenia (HCC)    Schizophrenia, acute (HCC) 11/13/2017   Tobacco abuse      Medicines Meds ordered this encounter  Medications   DISCONTD: 0.9 %  sodium chloride  infusion (Manually program via Guardrails IV Fluids)    I have reviewed the patients home medicines and have made adjustments as needed  Problem List / ED Course: Problem List Items Addressed This Visit   None Visit Diagnoses       Chronic anemia    -  Primary     Chronic respiratory failure with hypoxia (HCC)                       Final Clinical Impression(s) / ED Diagnoses Final diagnoses:  Chronic anemia  Chronic respiratory failure with hypoxia [facility])    Rx / DC Orders ED Discharge Orders     None         Rory Collard, MD 11/04/23 2318

## 2023-11-04 NOTE — Interval H&P Note (Signed)
 History and Physical Interval Note:  This note serves as history and physical  11/04/2023 9:34 PM  [name]  has presented today for surgery, with the diagnosis of * No surgery found *.  The various methods of treatment have been discussed with the patient and family. After consideration of risks, benefits and other options for treatment, the patient has consented to  * No surgery found * as a surgical intervention.  The patient's history has been reviewed, patient examined, no change in status, stable for surgery.  I have reviewed the patient's chart and labs.  Questions were answered to the patient's satisfaction.     Arrabella Westerman A Julie Nay

## 2023-11-04 NOTE — ED Triage Notes (Signed)
 129/74, 102HR, 20RR, 98% 4L/ 160-CBG

## 2023-11-04 NOTE — Discharge Instructions (Signed)

## 2023-11-04 NOTE — ED Notes (Signed)
Dr. Allyson Sabal at bedside

## 2023-11-04 NOTE — ED Notes (Signed)
 Patient discharged by RN. Patient verbalizes understanding of dc instructions. Patient ambulatory to lobby at time of discharge.

## 2023-11-04 NOTE — ED Notes (Signed)
 Dr. Janeane Mealy at bedside

## 2023-11-05 ENCOUNTER — Other Ambulatory Visit (HOSPITAL_COMMUNITY): Payer: Self-pay

## 2023-11-05 DIAGNOSIS — I82622 Acute embolism and thrombosis of deep veins of left upper extremity: Secondary | ICD-10-CM | POA: Diagnosis not present

## 2023-11-05 DIAGNOSIS — I480 Paroxysmal atrial fibrillation: Secondary | ICD-10-CM | POA: Diagnosis not present

## 2023-11-05 DIAGNOSIS — J449 Chronic obstructive pulmonary disease, unspecified: Secondary | ICD-10-CM | POA: Diagnosis not present

## 2023-11-05 DIAGNOSIS — D509 Iron deficiency anemia, unspecified: Secondary | ICD-10-CM | POA: Diagnosis not present

## 2023-11-05 DIAGNOSIS — J9611 Chronic respiratory failure with hypoxia: Secondary | ICD-10-CM

## 2023-11-05 LAB — HEPARIN LEVEL (UNFRACTIONATED)
Heparin Unfractionated: 0.2 [IU]/mL — ABNORMAL LOW (ref 0.30–0.70)
Heparin Unfractionated: 0.34 [IU]/mL (ref 0.30–0.70)
Heparin Unfractionated: 0.37 [IU]/mL (ref 0.30–0.70)

## 2023-11-05 LAB — LIPID PANEL
Cholesterol: 141 mg/dL (ref 0–200)
HDL: 88 mg/dL (ref >40)
LDL Cholesterol: 45 mg/dL (ref 0–99)
Total CHOL/HDL Ratio: 1.6 ratio
Triglycerides: 40 mg/dL (ref <150)
VLDL: 8 mg/dL (ref 0–40)

## 2023-11-05 LAB — GLUCOSE, CAPILLARY
Glucose-Capillary: 117 mg/dL — ABNORMAL HIGH (ref 70–99)
Glucose-Capillary: 123 mg/dL — ABNORMAL HIGH (ref 70–99)
Glucose-Capillary: 149 mg/dL — ABNORMAL HIGH (ref 70–99)
Glucose-Capillary: 156 mg/dL — ABNORMAL HIGH (ref 70–99)
Glucose-Capillary: 163 mg/dL — ABNORMAL HIGH (ref 70–99)
Glucose-Capillary: 94 mg/dL (ref 70–99)

## 2023-11-05 LAB — CBC
HCT: 29.5 % — ABNORMAL LOW (ref 36.0–46.0)
Hemoglobin: 8.6 g/dL — ABNORMAL LOW (ref 12.0–15.0)
MCH: 21.5 pg — ABNORMAL LOW (ref 26.0–34.0)
MCHC: 29.2 g/dL — ABNORMAL LOW (ref 30.0–36.0)
MCV: 73.8 fL — ABNORMAL LOW (ref 80.0–100.0)
Platelets: 314 10*3/uL (ref 150–400)
RBC: 4 MIL/uL (ref 3.87–5.11)
RDW: 20.3 % — ABNORMAL HIGH (ref 11.5–15.5)
WBC: 4.7 10*3/uL (ref 4.0–10.5)
nRBC: 0 % (ref 0.0–0.2)

## 2023-11-05 LAB — VITAMIN B12: Vitamin B-12: 457 pg/mL (ref 180–914)

## 2023-11-05 LAB — HEMOGLOBIN A1C
Hgb A1c MFr Bld: 6.5 % — ABNORMAL HIGH (ref 4.8–5.6)
Mean Plasma Glucose: 139.85 mg/dL

## 2023-11-05 LAB — FOLATE: Folate: 14.5 ng/mL (ref >5.9)

## 2023-11-05 LAB — PHOSPHORUS: Phosphorus: 3.5 mg/dL (ref 2.5–4.6)

## 2023-11-05 LAB — MAGNESIUM: Magnesium: 2.2 mg/dL (ref 1.7–2.4)

## 2023-11-05 MED ORDER — FERROUS SULFATE 325 (65 FE) MG PO TABS
325.0000 mg | ORAL_TABLET | Freq: Every day | ORAL | Status: DC
  Administered 2023-11-05 – 2023-11-14 (×9): 325 mg via ORAL
  Filled 2023-11-05 (×10): qty 1

## 2023-11-05 MED ORDER — LORAZEPAM 2 MG/ML IJ SOLN
0.5000 mg | INTRAMUSCULAR | Status: DC | PRN
  Administered 2023-11-05 – 2023-11-06 (×2): 1 mg via INTRAVENOUS
  Filled 2023-11-05 (×3): qty 1

## 2023-11-05 MED ORDER — ONDANSETRON HCL 4 MG/2ML IJ SOLN
4.0000 mg | Freq: Four times a day (QID) | INTRAMUSCULAR | Status: DC | PRN
  Administered 2023-11-08: 4 mg via INTRAVENOUS
  Filled 2023-11-05 (×2): qty 2

## 2023-11-05 MED ORDER — PHENOBARBITAL 32.4 MG PO TABS
97.2000 mg | ORAL_TABLET | Freq: Three times a day (TID) | ORAL | Status: AC
  Administered 2023-11-05 – 2023-11-06 (×3): 97.2 mg via ORAL
  Filled 2023-11-05 (×3): qty 3

## 2023-11-05 MED ORDER — KETOROLAC TROMETHAMINE 15 MG/ML IJ SOLN
15.0000 mg | Freq: Once | INTRAMUSCULAR | Status: AC
  Administered 2023-11-05: 15 mg via INTRAVENOUS
  Filled 2023-11-05: qty 1

## 2023-11-05 MED ORDER — PHENOBARBITAL 32.4 MG PO TABS
64.8000 mg | ORAL_TABLET | Freq: Three times a day (TID) | ORAL | Status: AC
  Administered 2023-11-06 – 2023-11-08 (×5): 64.8 mg via ORAL
  Filled 2023-11-05 (×6): qty 2

## 2023-11-05 MED ORDER — PHENOBARBITAL 32.4 MG PO TABS
32.4000 mg | ORAL_TABLET | Freq: Three times a day (TID) | ORAL | Status: AC
  Administered 2023-11-08 – 2023-11-10 (×6): 32.4 mg via ORAL
  Filled 2023-11-05 (×6): qty 1

## 2023-11-05 NOTE — TOC Benefit Eligibility Note (Signed)
 Pharmacy Patient Advocate Encounter  Insurance verification completed.    The patient is insured through [employer]. Patient has Medicare and is not eligible for a copay card, but may be able to apply for patient assistance or Medicare RX Payment Plan (Patient Must reach out to their plan, if eligible for payment plan), if available.    Ran test claim for Eliquis and the current 30 day co-pay is $0.   This test claim was processed through [facility]- copay amounts may vary at other pharmacies due to [employer], or as the patient moves through the different stages of their insurance plan.

## 2023-11-05 NOTE — Progress Notes (Signed)
 NAME:  [name], MRN:  [id number], DOB:  12-27-1965, LOS: 1 ADMISSION DATE:  11/04/2023, CONSULTATION DATE:  5/14 REFERRING MD:  Consuela Denier, hospitalist CHIEF COMPLAINT:  alcohol  withdrawal   History of Present Illness:  58 year old female with significant past medical history including anxiety and depression, chronic pain, COPD, tobacco use 1 pack/day, COPD, schizophrenia, hypothyroid, hypertension, hyperlipidemia, GERD, diabetes, chronic anemia, PAF not anticoagulated with fall risk, history of PE, cocaine use disorder, alcohol  use disorder, seizure disorder versus alcohol  withdrawal seizure history?  Who presented to the emergency department with low hemoglobin.  She was seen last night in the emergency department for low hemoglobin and left AMA prior to receiving any transfusion.  She returned this morning with same complaint.  Also noting left upper extremity swelling and alcohol  withdrawal.  Endorses 2 to 3, 40 ounce beers daily.  Last drink yesterday at unknown time.  Labs notable for hemoglobin of 6.6, microcytic, albumin  of 2.6, AST mildly elevated at 47, ethanol negative, iron  panel send confirming iron  deficiency anemia.  Had left upper extremity DVT study which was positive for DVT.  She was started on a heparin  drip.  Additionally noted higher CIWA scores up to 13.  She was started on CIWA protocol.  She was initially admitted to hospitalist.  Hospitalist consulted ICU over concern for hallucinations.  Had received total of 5 mg of Ativan  over 2 hours prior.   On my exam, patient is awake, alert, oriented.  She ambulated back from the bathroom.  She has grossly tremulous and alcohol  withdrawal.  I do not feel that she is in full-blown DTs at this time.  She received another milligram of Ativan  prior to my arrival.  She states last drink was yesterday.  She denies having any abdominal pain.  Denies taking aspirin , NSAIDs, Goody powders daily.  Denies any hematemesis hematochezia or  melena.  Pertinent  Medical History   anxiety and depression, chronic pain, COPD, tobacco use 1 pack/day, COPD, schizophrenia, hypothyroid, hypertension, hyperlipidemia, GERD, diabetes, chronic anemia, PAF not anticoagulated with fall risk, history of PE, cocaine use disorder, alcohol  use disorder, seizure disorder  Significant Hospital Events: Including procedures, antibiotic start and stop dates in addition to other pertinent events   5/14: Admit to ICU for alcohol  withdrawal  Interim History / Subjective:   Hgb improved to 8.6 this morning  Lipid panel WNL No metabolic panel for review   Objective    Blood pressure (!) 140/77, pulse 69, temperature (!) 97.5 F (36.4 C), temperature source Axillary, resp. rate (!) 25, height 5\' 2"  (1.575 m), weight 55.2 kg, last menstrual period 01/08/2011, SpO2 94%.        Intake/Output Summary (Last 24 hours) at 11/05/2023 4098 Last data filed at 11/05/2023 1191 Gross per 24 hour  Intake 860.83 ml  Output 1200 ml  Net -339.17 ml   Filed Weights   11/04/23 0741 11/04/23 1620 11/05/23 0413  Weight: 49 kg 56.6 kg 55.2 kg    Examination: General: Chronically ill F NAD  HENT:  NCAT pink mm  Lungs: CTAb on RA  Abdomen: soft ndnt  Extremities: LUE edema  Neuro: Awake, slightly lethargic following commands and oriented x3 GU: defer   Resolved Hospital Problem list   HLD  Assessment & Plan:   EtOH abuse disorder EtOH withdrawal Seizure -- etoh withdrawal vs breakthrough sz  P -cont phenobarb  -dc precedex .  -sz precautions -cont high dose thiamine  folate, MV  -BID keppra  500mg  Zonisamide  200mg  BID  --  reports she has not been taking these at home   AoC anemia Fe deficiency  -2 PRBC 5/14 -no s/sx GIB  P -daily CBC  -has allergy to iron  dextran; will talk to pharmD and see if we have other options   LUE DVT  -notably hx PE and Afib, not on AC at home 2/2 fall risk  P -hep gtt for now. Not sure what the best answer is re [facility]  plan for her-- has previously not been home [facility] candidate given fall risk. Now lives alone. Not ready to stop drinking.  -think we can have PT eval her here and help assess fall risk within best of their ability (though this does not really take into account home behaviors/risks w etoh) and incorporate this in trying to stratify risk/benefit of home [facility] w acute DVT, Afib, but ongoing etoh abuse + falls   pAF HTN -not on AC previously w fall risk P -hep gtt in setting of acute LUE DVT above  -optimize lytes -- will check BMP + mag 5/16. 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  Elevated LFTs -PPI -PRN LFTs   COPD Chronic hypoxic resp failure  Tobacco use disorder  Continues 1 pack/day, appears to be on 4 to 5 L a minute as needed home O2 P -duoneb  -nicotine  patch -IS, pulm hygiene, mobility  -O2 for SpO2 88-92 -encourage smoking cessation and edu on dangers of smoking on oxygen    DM2  P -holding home metformin , cont SSI   Depression Anxiety Schizophrenia -Abilify    Medication non-adherence Fall risk  -sounds like husband used to manage her meds, without him doing this she does not reportedly take her homw sz or psych meds -has previously not been an AC candidate for afib q fq falls 2/2 etoh abuse. She lives alone so this risk is arguably high; but now also has acute DVT.  P -TOC consult   -PT consult -if she is safe to go home directly from hospital, need a plan for managing her meds.  Possibly home health? -she is not honest about not being ready to pursue etoh cessation.   Dispo:  5/15 stable to transfer out of ICU, will ask [facility] to take over care 5/16   Best Practice (right click and "Reselect all SmartList Selections" daily)   Diet/type: Regular consistency (see orders) DVT prophylaxis: systemic heparin  GI prophylaxis: PPI Lines: N/A Foley:  N/A Code Status:  full code Last date of multidisciplinary goals of care discussion  [5/15 pt update on plan of care at bedside]   Labs   CBC: Recent Labs  Lab 10/29/23 1840 10/30/23 0650 10/31/23 0432 11/03/23 0800 11/04/23 0510 11/05/23 0450  WBC 9.0 9.5 5.7 8.6 7.5 4.7  NEUTROABS 7.9*  --  3.6 6.1 5.4  --   HGB 7.4* 7.2* 7.6* 7.4* 6.6* 8.6*  HCT 26.3* 26.1* 26.8* 26.5* 24.1* 29.5*  MCV 72.5* 72.9* 73.2* 72.4* 72.4* 73.8*  PLT 248 228 PLATELET CLUMPS NOTED ON SMEAR, UNABLE TO ESTIMATE 333 325 314    Basic Metabolic Panel: Recent Labs  Lab 10/29/23 1840 10/30/23 0650 10/31/23 0432 11/03/23 0640 11/04/23 0510 11/05/23 0559  NA 135 138 135 134* 136  --   K 4.4 3.6 3.9 4.4 4.3  --   CL 102 102 102 102 104  --   CO2 19* 23 27 21* 27  --   GLUCOSE 253* 117* 116* 142* 139*  --   BUN  7 7 6 7  <5*  --   CREATININE 0.71 0.59 0.56 0.49 0.51  --   CALCIUM  8.3* 8.5* 7.8* 7.9* 7.7*  --   MG  --  2.1  --   --   --  2.2  PHOS  --  3.6  --   --   --   --    GFR: Estimated Creatinine Clearance: 59.9 mL/min (by C-G formula based on SCr of 0.51 mg/dL). Recent Labs  Lab 10/30/23 0650 10/31/23 0432 11/03/23 0800 11/04/23 0510 11/05/23 0450  WBC 9.5 5.7 8.6 7.5 4.7  LATICACIDVEN 3.1*  --   --   --   --     Liver Function Tests: Recent Labs  Lab 10/29/23 1840 11/03/23 0640 11/04/23 0510  AST 36 49* 47*  ALT 23 24 22   ALKPHOS 88 77 70  BILITOT 0.4 0.6 0.4  PROT 7.0 6.0* 5.9*  ALBUMIN  3.1* 2.7* 2.6*   Recent Labs  Lab 10/29/23 1840 11/04/23 0510  LIPASE 42 50   Recent Labs  Lab 10/30/23 0650  AMMONIA 29    ABG    Component Value Date/Time   PHART 7.277 (L) 11/20/2022 1640   PCO2ART 64.9 (H) 11/20/2022 1640   PO2ART 69 (L) 11/20/2022 1640   HCO3 19.9 (L) 01/25/2023 0427   TCO2 25 04/13/2023 0332   ACIDBASEDEF 3.0 (H) 01/25/2023 0427   O2SAT 100 01/25/2023 0427     Coagulation Profile: No results for input(s): "INR", "PROTIME" in the last 168 hours.  Cardiac Enzymes: No results for input(s): "CKTOTAL", "CKMB", "CKMBINDEX",  "TROPONINI" in the last 168 hours.  HbA1C: Hgb A1c MFr Bld  Date/Time Value Ref Range Status  11/05/2023 04:30 AM 6.5 (H) 4.8 - 5.6 % Final    Comment:    (NOTE) Pre diabetes:          5.7%-6.4%  Diabetes:              >6.4%  Glycemic control for   <7.0% adults with diabetes   01/11/2023 09:00 AM 7.3 (H) 4.8 - 5.6 % Final    Comment:    (NOTE) Pre diabetes:          5.7%-6.4%  Diabetes:              >6.4%  Glycemic control for   <7.0% adults with diabetes     CBG: Recent Labs  Lab 11/04/23 1621 11/04/23 1947 11/04/23 2313 11/05/23 0315 11/05/23 0725  GLUCAP 118* 164* 94 156* 123*     CCT n/a  High MDM   Eston Hence MSN, AGACNP-BC [facility] Amion for pager 11/05/2023, 9:37 AM

## 2023-11-05 NOTE — Progress Notes (Signed)
 PHARMACY - ANTICOAGULATION CONSULT NOTE  Pharmacy Consult for heparin  Indication: DVT  Allergies  Allergen Reactions   Iron  Dextran Shortness Of Breath and Anxiety   Aspirin  Nausea And Vomiting and Other (See Comments)    Ok to take tylenol  or ibuprofen     Penicillins Other (See Comments)    Unknown reaction from childhood    Patient Measurements: Height: 5\' 2"  (157.5 cm) Weight: 55.2 kg (121 lb 11.1 oz) IBW/kg (Calculated) : 50.1 HEPARIN  DW (KG): 49  Vital Signs: Temp: 97.9 F (36.6 C) (05/15 1200) Temp Source: Oral (05/15 1200) BP: 105/55 (05/15 1200) Pulse Rate: 105 (05/15 1300)  Labs: Recent Labs    11/03/23 0640 11/03/23 0800 11/03/23 0800 11/03/23 0844 11/04/23 0510 11/04/23 2137 11/05/23 0450 11/05/23 0537 11/05/23 1405  HGB  --  7.4*   < >  --  6.6*  --  8.6*  --   --   HCT  --  26.5*  --   --  24.1*  --  29.5*  --   --   PLT  --  333  --   --  325  --  314  --   --   HEPARINUNFRC  --   --   --   --   --  <0.10*  --  0.20* 0.34  CREATININE 0.49  --   --   --  0.51  --   --   --   --   TROPONINIHS  --   --   --  4  --   --   --   --   --    < > = values in this interval not displayed.    Estimated Creatinine Clearance: 59.9 mL/min (by C-G formula based on SCr of 0.51 mg/dL).   Medical History: Past Medical History:  Diagnosis Date   Abdominal pain    Accidental drug overdose April 2013   Anxiety    Atrial fibrillation (HCC) 09/29/11   converted spontaneously   Chronic back pain    Chronic knee pain    Chronic nausea    Chronic pain    COPD (chronic obstructive pulmonary disease) (HCC)    Depression    Diabetes mellitus    states her doctor took her off all DM meds in past month   Diabetic neuropathy (HCC)    Dyspnea    with exertion    GERD (gastroesophageal reflux disease)    Headache(784.0)    migraines    HTN (hypertension)    not on meds since in a year    Hyperlipidemia    Hypothyroidism    not on meds in a while    Mental  disorder    Bipolar and schizophrenic   Nausea and vomiting 01/02/2023   Requires supplemental oxygen     as needed per patient    Schizophrenia (HCC)    Schizophrenia, acute (HCC) 11/13/2017   Tobacco abuse     Assessment: 44 YOF presenting with anemia, also swelling in left upper extremity, doppler positive for DVT, workup for GI bleed negative and concern for iron  deficiency anemia, discussed with MD and will omit bolus and start conservative heparin  gtt. Pertinent PMH of PAF not anticoagulated given fall risk due to etoh abuse.   HL therapeutic (0.34) with infusion running at 1050 units/hr. No issues with heparin  infusion or signs of bleeding noted. Hgb 6.6 > s/p 2u RBC > 8.6. PLTc wnl   Goal of Therapy:  Heparin  level 0.3-0.5 units/ml  Monitor platelets by anticoagulation protocol: Yes   Plan:  Continue heparin  gtt at 1050 units/hr Obtain confirmatory 6 hour heparin  level  Monitor daily heparin  level and CBC Continue to monitor H&H   F/u long term AC plan  Harvest Lineman, PharmD PGY1 Pharmacy Resident

## 2023-11-05 NOTE — TOC CM/SW Note (Signed)
 Transition of Care [facility]) - Inpatient Brief Assessment   Patient Details  Name: [name] MRN: [id number] Date of Birth: 1966/06/18  [facility] CM/SW Contact:    Tom-Johnson, Angelique Ken, RN Phone Number: 11/05/2023, 1:35 PM   Clinical Narrative:  Patient presented to the ED with Anemia. Patient was seen in the ED night prior to this admit and Left AMA after blood transfusion was recommended for hgb of 6.6. Patient returned and agrees to blood transfusion.  Patient noted Lt upper extremity swelling, doppler positive for DVT, started on Heparin  gtt. GI bleed workup negative. Patient has hx of ETOH, on CIWA protocol.  CM went to speak with patient at bedside but noted she was sound asleep and advised by RN not to wake up. CM called patient's daughter, [name] and spoke with her. [name] informed CM of patient's non-compliance with anything and struggles with Alcohol .  Patient lives alone but spends the daytime with one of her daughters who lives closeby. Patient is not employed, on disability.   Patient does not currently have a PCP, [name] states patient had an appointment on 10/23/23 for new patient establishment at [facility] but missed the appointment. CM informed [name] that new patient establishment will be rescheduled when patient is medically ready for discharge.   Patient not Medically ready for discharge.  CM will continue to follow as patient progresses with care towards discharge.             Metabolic encephalopathy   Transition of Care Asessment: Insurance and Status: Insurance coverage has been reviewed Patient has primary care physician: No (Need to reschedule new patient establishment.) Home environment has been reviewed: Yes Prior level of function:: Independent Prior/Current Home Services: No current home services Social Drivers of Health Review: SDOH reviewed no interventions necessary Readmission risk has been  reviewed: Yes Transition of care needs: transition of care needs identified, TOC will continue to follow

## 2023-11-05 NOTE — Progress Notes (Signed)
 PHARMACY - ANTICOAGULATION CONSULT NOTE  Pharmacy Consult for heparin  Indication: DVT  Allergies  Allergen Reactions   Iron  Dextran Shortness Of Breath and Anxiety   Aspirin  Nausea And Vomiting and Other (See Comments)    Ok to take tylenol  or ibuprofen     Penicillins Other (See Comments)    Unknown reaction from childhood    Patient Measurements: Height: 5\' 2"  (157.5 cm) Weight: 55.2 kg (121 lb 11.1 oz) IBW/kg (Calculated) : 50.1 HEPARIN  DW (KG): 49  Vital Signs: Temp: 96.3 F (35.7 C) (05/15 0315) Temp Source: Axillary (05/15 0315) BP: 116/95 (05/15 0400) Pulse Rate: 69 (05/15 0630)  Labs: Recent Labs    11/03/23 0640 11/03/23 0800 11/03/23 0800 11/03/23 0844 11/04/23 0510 11/04/23 2137 11/05/23 0450 11/05/23 0537  HGB  --  7.4*   < >  --  6.6*  --  8.6*  --   HCT  --  26.5*  --   --  24.1*  --  29.5*  --   PLT  --  333  --   --  325  --  314  --   HEPARINUNFRC  --   --   --   --   --  <0.10*  --  0.20*  CREATININE 0.49  --   --   --  0.51  --   --   --   TROPONINIHS  --   --   --  4  --   --   --   --    < > = values in this interval not displayed.    Estimated Creatinine Clearance: 59.9 mL/min (by C-G formula based on SCr of 0.51 mg/dL).   Medical History: Past Medical History:  Diagnosis Date   Abdominal pain    Accidental drug overdose April 2013   Anxiety    Atrial fibrillation (HCC) 09/29/11   converted spontaneously   Chronic back pain    Chronic knee pain    Chronic nausea    Chronic pain    COPD (chronic obstructive pulmonary disease) (HCC)    Depression    Diabetes mellitus    states her doctor took her off all DM meds in past month   Diabetic neuropathy (HCC)    Dyspnea    with exertion    GERD (gastroesophageal reflux disease)    Headache(784.0)    migraines    HTN (hypertension)    not on meds since in a year    Hyperlipidemia    Hypothyroidism    not on meds in a while    Mental disorder    Bipolar and schizophrenic    Nausea and vomiting 01/02/2023   Requires supplemental oxygen     as needed per patient    Schizophrenia (HCC)    Schizophrenia, acute (HCC) 11/13/2017   Tobacco abuse     Assessment: 67 YOF presenting with anemia, also swelling in left upper extremity, doppler positive for DVT, workup for GI bleed negative and concern for iron  deficiency anemia, discussed with MD and will omit bolus and start conservative heparin  gtt  HL subtherapeutic (0.2) with infusion running at 1000 units/hr.  Hgb 6.6 > s/p 2u RBC > 8.6. PLTc wnl   Goal of Therapy:  Heparin  level 0.3-0.5 units/ml Monitor platelets by anticoagulation protocol: Yes   Plan:  Increase heparin  gtt to 1050 units/hr F/u 6 hour heparin  level  Monitor daily heparin  level and CBC Continue to monitor H&H   F/u long term AC plan  Ardene Remley  Stanford Earl, PharmD PGY1 Pharmacy Resident

## 2023-11-05 NOTE — Progress Notes (Signed)
 eLink Physician-Brief Progress Note Patient Name: [name] DOB: 04/29/1966 MRN: [id number]   Date of Service  11/05/2023  HPI/Events of Note  ETOH withdrawal delirium with sub-optimal control on current regimen.  eICU Interventions  PRN Ativan  interval shortened to Q 2 hours.        Tamotsu Wiederholt U [name] 11/05/2023, 9:14 PM

## 2023-11-05 NOTE — Progress Notes (Signed)
 PHARMACY - ANTICOAGULATION CONSULT NOTE  Pharmacy Consult for heparin  Indication: DVT  Allergies  Allergen Reactions   Iron  Dextran Shortness Of Breath and Anxiety   Aspirin  Nausea And Vomiting and Other (See Comments)    Ok to take tylenol  or ibuprofen     Penicillins Other (See Comments)    Unknown reaction from childhood    Patient Measurements: Height: 5\' 2"  (157.5 cm) Weight: 55.2 kg (121 lb 11.1 oz) IBW/kg (Calculated) : 50.1 HEPARIN  DW (KG): 49  Vital Signs: Temp: 98 F (36.7 C) (05/15 1956) Temp Source: Axillary (05/15 1956) BP: 129/74 (05/15 1957) Pulse Rate: 105 (05/15 1300)  Labs: Recent Labs    11/03/23 0640 11/03/23 0800 11/03/23 0800 11/03/23 0844 11/04/23 0510 11/04/23 2137 11/05/23 0450 11/05/23 0537 11/05/23 1405 11/05/23 2051  HGB  --  7.4*   < >  --  6.6*  --  8.6*  --   --   --   HCT  --  26.5*  --   --  24.1*  --  29.5*  --   --   --   PLT  --  333  --   --  325  --  314  --   --   --   HEPARINUNFRC  --   --   --   --   --    < >  --  0.20* 0.34 0.37  CREATININE 0.49  --   --   --  0.51  --   --   --   --   --   TROPONINIHS  --   --   --  4  --   --   --   --   --   --    < > = values in this interval not displayed.    Estimated Creatinine Clearance: 59.9 mL/min (by C-G formula based on SCr of 0.51 mg/dL).   Medical History: Past Medical History:  Diagnosis Date   Abdominal pain    Accidental drug overdose April 2013   Anxiety    Atrial fibrillation (HCC) 09/29/11   converted spontaneously   Chronic back pain    Chronic knee pain    Chronic nausea    Chronic pain    COPD (chronic obstructive pulmonary disease) (HCC)    Depression    Diabetes mellitus    states her doctor took her off all DM meds in past month   Diabetic neuropathy (HCC)    Dyspnea    with exertion    GERD (gastroesophageal reflux disease)    Headache(784.0)    migraines    HTN (hypertension)    not on meds since in a year    Hyperlipidemia     Hypothyroidism    not on meds in a while    Mental disorder    Bipolar and schizophrenic   Nausea and vomiting 01/02/2023   Requires supplemental oxygen     as needed per patient    Schizophrenia (HCC)    Schizophrenia, acute (HCC) 11/13/2017   Tobacco abuse     Assessment: 40 YOF presenting with anemia, also swelling in left upper extremity, doppler positive for DVT, workup for GI bleed negative and concern for iron  deficiency anemia, discussed with MD and will omit bolus and start conservative heparin  gtt. Pertinent PMH of PAF not anticoagulated given fall risk due to etoh abuse.   Heparin  level therapeutic (0.37) with infusion running at 1050 units/hr. No issues with heparin  infusion or signs of  bleeding noted. Hgb 6.6 > s/p 2u RBC > 8.6. PLTc wnl   Goal of Therapy:  Heparin  level 0.3-0.5 units/ml Monitor platelets by anticoagulation protocol: Yes   Plan:  Continue heparin  gtt at 1050 units/hr Monitor daily heparin  level and CBC Continue to monitor H&H   F/u long term AC plan  Thank you for involving pharmacy in this patient's care.  Caroline Cinnamon, PharmD, BCPS Clinical Pharmacist Clinical phone for 11/05/2023 is [phone] 11/05/2023 9:43 PM

## 2023-11-05 NOTE — Plan of Care (Signed)

## 2023-11-06 DIAGNOSIS — D500 Iron deficiency anemia secondary to blood loss (chronic): Secondary | ICD-10-CM | POA: Diagnosis not present

## 2023-11-06 DIAGNOSIS — F10939 Alcohol use, unspecified with withdrawal, unspecified: Secondary | ICD-10-CM

## 2023-11-06 LAB — TYPE AND SCREEN
ABO/RH(D): A POS
Antibody Screen: POSITIVE
Donor AG Type: NEGATIVE
Unit division: 0
Unit division: 0
Unit division: 0

## 2023-11-06 LAB — BPAM RBC
Blood Product Expiration Date: 202505182359
Blood Product Expiration Date: 202506122359
Blood Product Expiration Date: 202506122359
ISSUE DATE / TIME: 202505141540
ISSUE DATE / TIME: 202505151506
Unit Type and Rh: 600
Unit Type and Rh: 6200
Unit Type and Rh: 6200

## 2023-11-06 LAB — CBC
HCT: 27.2 % — ABNORMAL LOW (ref 36.0–46.0)
Hemoglobin: 7.8 g/dL — ABNORMAL LOW (ref 12.0–15.0)
MCH: 21.4 pg — ABNORMAL LOW (ref 26.0–34.0)
MCHC: 28.7 g/dL — ABNORMAL LOW (ref 30.0–36.0)
MCV: 74.5 fL — ABNORMAL LOW (ref 80.0–100.0)
Platelets: 306 10*3/uL (ref 150–400)
RBC: 3.65 MIL/uL — ABNORMAL LOW (ref 3.87–5.11)
RDW: 20.3 % — ABNORMAL HIGH (ref 11.5–15.5)
WBC: 7.3 10*3/uL (ref 4.0–10.5)
nRBC: 0 % (ref 0.0–0.2)

## 2023-11-06 LAB — BASIC METABOLIC PANEL WITH GFR
Anion gap: 6 (ref 5–15)
BUN: 11 mg/dL (ref 6–20)
CO2: 24 mmol/L (ref 22–32)
Calcium: 8 mg/dL — ABNORMAL LOW (ref 8.9–10.3)
Chloride: 104 mmol/L (ref 98–111)
Creatinine, Ser: 0.76 mg/dL (ref 0.44–1.00)
GFR, Estimated: >60 mL/min (ref >60)
Glucose, Bld: 103 mg/dL — ABNORMAL HIGH (ref 70–99)
Potassium: 4.4 mmol/L (ref 3.5–5.1)
Sodium: 134 mmol/L — ABNORMAL LOW (ref 135–145)

## 2023-11-06 LAB — GLUCOSE, CAPILLARY
Glucose-Capillary: 110 mg/dL — ABNORMAL HIGH (ref 70–99)
Glucose-Capillary: 129 mg/dL — ABNORMAL HIGH (ref 70–99)
Glucose-Capillary: 91 mg/dL (ref 70–99)
Glucose-Capillary: 168 mg/dL — ABNORMAL HIGH (ref 70–99)

## 2023-11-06 LAB — HEPARIN LEVEL (UNFRACTIONATED): Heparin Unfractionated: 0.45 [IU]/mL (ref 0.30–0.70)

## 2023-11-06 LAB — MAGNESIUM: Magnesium: 2.1 mg/dL (ref 1.7–2.4)

## 2023-11-06 LAB — LEVETIRACETAM LEVEL: Levetiracetam Lvl: 14.2 ug/mL (ref 10.0–40.0)

## 2023-11-06 MED ORDER — LORAZEPAM 2 MG/ML IJ SOLN
1.0000 mg | INTRAMUSCULAR | Status: AC | PRN
Start: 2023-11-06 — End: 2023-11-07
  Administered 2023-11-06 – 2023-11-07 (×8): 2 mg via INTRAVENOUS
  Filled 2023-11-06 (×8): qty 1

## 2023-11-06 MED ORDER — POLYETHYLENE GLYCOL 3350 17 G PO PACK
17.0000 g | PACK | Freq: Two times a day (BID) | ORAL | Status: DC
  Administered 2023-11-07 – 2023-11-13 (×8): 17 g via ORAL
  Filled 2023-11-06 (×14): qty 1

## 2023-11-06 MED ORDER — INSULIN ASPART 100 UNIT/ML IJ SOLN
0.0000 [IU] | Freq: Three times a day (TID) | INTRAMUSCULAR | Status: DC
  Administered 2023-11-06 – 2023-11-07 (×2): 3 [IU] via SUBCUTANEOUS
  Administered 2023-11-08: 5 [IU] via SUBCUTANEOUS
  Administered 2023-11-09: 3 [IU] via SUBCUTANEOUS
  Administered 2023-11-09: 5 [IU] via SUBCUTANEOUS
  Administered 2023-11-09 – 2023-11-10 (×2): 3 [IU] via SUBCUTANEOUS
  Administered 2023-11-11: 8 [IU] via SUBCUTANEOUS
  Administered 2023-11-11 – 2023-11-12 (×2): 2 [IU] via SUBCUTANEOUS
  Administered 2023-11-13 (×2): 3 [IU] via SUBCUTANEOUS

## 2023-11-06 NOTE — Progress Notes (Signed)
 eLink Physician-Brief Progress Note Patient Name: [name] DOB: [birth year] MRN: [id number]   Date of Service  11/06/2023  HPI/Events of Note  Patient with breakthrough agitated delirium with 1 mg of Ativan , she is trying to get out of bed, posing a fall risk.  eICU Interventions  Ativan  order changed to 1-2 mg iv Q 2 hours and Posey and soft bilateral wrist restraints ordered.        Meriam Chojnowski U Khelani Kops 11/06/2023, 3:01 AM

## 2023-11-06 NOTE — Progress Notes (Signed)
 PHARMACY - ANTICOAGULATION CONSULT NOTE  Pharmacy Consult for heparin  Indication: DVT  Allergies  Allergen Reactions   Iron  Dextran Shortness Of Breath and Anxiety   Aspirin  Nausea And Vomiting and Other (See Comments)    Ok to take tylenol  or ibuprofen     Penicillins Other (See Comments)    Unknown reaction from childhood    Patient Measurements: Height: 5\' 2"  (157.5 cm) Weight: 56 kg (123 lb 7.3 oz) IBW/kg (Calculated) : 50.1 HEPARIN  DW (KG): 49  Vital Signs: Temp: 98.1 F (36.7 C) (05/16 0355) Temp Source: Axillary (05/16 0355) BP: 131/61 (05/16 0640) Pulse Rate: 91 (05/16 0640)  Labs: Recent Labs    11/03/23 0844 11/04/23 0510 11/04/23 2137 11/05/23 0450 11/05/23 0537 11/05/23 1405 11/05/23 2051 11/06/23 0415 11/06/23 0600  HGB  --  6.6*  --  8.6*  --   --   --  7.8*  --   HCT  --  24.1*  --  29.5*  --   --   --  27.2*  --   PLT  --  325  --  314  --   --   --  306  --   HEPARINUNFRC  --   --    < >  --    < > 0.34 0.37  --  0.45  CREATININE  --  0.51  --   --   --   --   --  0.76  --   TROPONINIHS 4  --   --   --   --   --   --   --   --    < > = values in this interval not displayed.    Estimated Creatinine Clearance: 59.9 mL/min (by C-G formula based on SCr of 0.76 mg/dL).   Medical History: Past Medical History:  Diagnosis Date   Abdominal pain    Accidental drug overdose April 2013   Anxiety    Atrial fibrillation (HCC) 09/29/11   converted spontaneously   Chronic back pain    Chronic knee pain    Chronic nausea    Chronic pain    COPD (chronic obstructive pulmonary disease) (HCC)    Depression    Diabetes mellitus    states her doctor took her off all DM meds in past month   Diabetic neuropathy (HCC)    Dyspnea    with exertion    GERD (gastroesophageal reflux disease)    Headache(784.0)    migraines    HTN (hypertension)    not on meds since in a year    Hyperlipidemia    Hypothyroidism    not on meds in a while    Mental  disorder    Bipolar and schizophrenic   Nausea and vomiting 01/02/2023   Requires supplemental oxygen     as needed per patient    Schizophrenia (HCC)    Schizophrenia, acute (HCC) 11/13/2017   Tobacco abuse     Assessment: 32 YOF presenting with anemia, also swelling in left upper extremity, doppler positive for DVT, workup for GI bleed negative and concern for iron  deficiency anemia, discussed with MD and will omit bolus and start conservative heparin  gtt. Pertinent PMH of PAF not anticoagulated given fall risk due to etoh abuse.   Heparin  level therapeutic (0.45) with infusion running at 1050 units/hr. No issues with heparin  infusion or signs of bleeding noted. Hgb 6.6 > s/p 2u RBC 5/14 >>7.8. PLTc wnl   Goal of Therapy:  Heparin  level  0.3-0.5 units/ml Monitor platelets by anticoagulation protocol: Yes   Plan:  Continue heparin  gtt at 1050 units/hr Monitor daily heparin  level and CBC Continue to monitor H&H   F/u long term AC plan  Thank you for involving pharmacy in this patient's care. Harvest Lineman, PharmD PGY1 Pharmacy Resident

## 2023-11-06 NOTE — Care Plan (Addendum)
 Pt intermittantly attempting to get OOB without assistance despite Posey belt. Pt threatening to pull wires and IVs so the she "can go home". Pt is alert and oriented x4 but is impulsive. Have gotten patient OOB to [facility] several times and Pt is very unstable on feet.   Pt wanting cigarettes and ETOH. States that she drinks two 40oz beers every day. Pt has nicotine  patch on L upper chest.   Updated daughter earlier and notified MD that daughter is wanting to discuss plan of care.   Addendum:   Pt continues to be impulsive but alert and oriented. Continually reiterates " I want to go home" Pt continues to be successfully restrained with a posey belt. When stood to transfer to [facility] Pt has good strength but poor balance, poor endurance and poor safety awareness

## 2023-11-06 NOTE — Plan of Care (Signed)
  Problem: Education: Goal: Ability to describe self-care measures that may prevent or decrease complications (Diabetes Survival Skills Education) will improve Outcome: Progressing Goal: Individualized Educational Video(s) Outcome: Progressing   Problem: Coping: Goal: Ability to adjust to condition or change in health will improve Outcome: Progressing   Problem: Fluid Volume: Goal: Ability to maintain a balanced intake and output will improve Outcome: Progressing   Problem: Health Behavior/Discharge Planning: Goal: Ability to identify and utilize available resources and services will improve Outcome: Progressing Goal: Ability to manage health-related needs will improve Outcome: Progressing   Problem: Metabolic: Goal: Ability to maintain appropriate glucose levels will improve Outcome: Progressing   Problem: Nutritional: Goal: Maintenance of adequate nutrition will improve Outcome: Progressing   Problem: Skin Integrity: Goal: Risk for impaired skin integrity will decrease Outcome: Progressing   Problem: Tissue Perfusion: Goal: Adequacy of tissue perfusion will improve Outcome: Progressing   Problem: Education: Goal: Knowledge of General Education information will improve Description: Including pain rating scale, medication(s)/side effects and non-pharmacologic comfort measures Outcome: Progressing   Problem: Health Behavior/Discharge Planning: Goal: Ability to manage health-related needs will improve Outcome: Progressing   Problem: Clinical Measurements: Goal: Ability to maintain clinical measurements within normal limits will improve Outcome: Progressing Goal: Will remain free from infection Outcome: Progressing Goal: Respiratory complications will improve Outcome: Progressing Goal: Cardiovascular complication will be avoided Outcome: Progressing   Problem: Activity: Goal: Risk for activity intolerance will decrease Outcome: Progressing   Problem:  Nutrition: Goal: Adequate nutrition will be maintained Outcome: Progressing   Problem: Elimination: Goal: Will not experience complications related to urinary retention Outcome: Progressing   Problem: Pain Managment: Goal: General experience of comfort will improve and/or be controlled Outcome: Progressing   Problem: Safety: Goal: Ability to remain free from injury will improve Outcome: Progressing   Problem: Skin Integrity: Goal: Risk for impaired skin integrity will decrease Outcome: Progressing

## 2023-11-06 NOTE — Progress Notes (Signed)
 PROGRESS NOTE  [name]    DOB: 01/29/1966, 58 y.o.  WUJ:[id number]    Code Status: Full Code   DOA: 11/04/2023   LOS: 2   Brief hospital course  [name] is a 58 y.o. female with a PMH significant for anxiety and depression, chronic pain, COPD, tobacco use 1 pack/day, COPD, schizophrenia, hypothyroid, hypertension, hyperlipidemia, GERD, diabetes, chronic anemia, PAF not anticoagulated with fall risk, history of PE, cocaine use disorder, alcohol  use disorder, seizure disorder versus alcohol  withdrawal seizure history.  Who presented to the emergency department with low hemoglobin and Dts from alcohol  withdrawals.   Patient was admitted to medicine service for further workup and management of alcohol  withdrawal as outlined in detail below.  11/06/23 -still in acute withdrawals. Hemoglobin gradually decreasing still s/p transfusion   Assessment & Plan  Principal Problem:   Alcohol  withdrawal (HCC)  EtOH use disorder  EtOH withdrawal Seizure -- etoh withdrawal vs breakthrough sz  -cont phenobarb  -sz precautions -cont high dose thiamine  folate, MV  -BID keppra  500mg  Zonisamide  200mg  BID  -- reports she has not been taking these at home  - PRN ativan    AoC anemia- hgb 6.6>8.6>7.8 s/p replacement Fe deficiency  -2 PRBC 5/14 -no s/sx GIB  - CBC am   LUE DVT  -notably hx PE and Afib, not on AC at home 2/2 fall risk  -hep gtt for now. Poor candidate for  long term anticoagulation with frequent falls with alcohol  use.    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  VTE ppx: SCDs Start: 11/04/23 1310   Diet:     Diet   Diet regular Room service appropriate? Yes; Fluid consistency: Thin   Consultants: CCM  Subjective 11/06/23    Pt reports nothing. Does not wake up for exam. Recent ativan  dose given   Objective   Vitals:   11/06/23 0355 11/06/23 0400 11/06/23 0500 11/06/23 0640  BP:  (!) 114/57  131/61  Pulse:  96  91  Resp:  20  (!) 26  Temp: 98.1 F (36.7 C)     TempSrc: Axillary     SpO2:  91%  96%  Weight:   56 kg   Height:        Intake/Output Summary (Last 24 hours) at 11/06/2023 0751 Last data filed at 11/06/2023 0600 Gross per 24 hour  Intake 1282.26 ml  Output 3150 ml  Net -1867.74 ml   Filed Weights   11/04/23 1620 11/05/23 0413 11/06/23 0500  Weight: 56.6 kg 55.2 kg 56 kg     Physical Exam:  General: asleep, NAD Respiratory: normal respiratory effort. stertor Cardiovascular: quick capillary refill, normal S1/S2, RRR, no JVD, murmurs Nervous: Asleep. Does not wake to voice or gentle tactile stimulation Extremities: no edema Skin: dry, intact, normal temperature, normal color. No rashes,  lesions or ulcers on exposed skin  Labs   I have personally reviewed the following labs and imaging studies CBC    Component Value Date/Time   WBC 7.3 11/06/2023 0415   RBC 3.65 (L) 11/06/2023 0415   HGB 7.8 (L) 11/06/2023 0415   HCT 27.2 (L) 11/06/2023 0415   PLT 306 11/06/2023 0415   MCV 74.5 (L) 11/06/2023 0415   MCH 21.4 (L) 11/06/2023 0415   MCHC 28.7 (L) 11/06/2023 0415   RDW 20.3 (H) 11/06/2023 0415   LYMPHSABS 1.3 11/04/2023 0510   MONOABS 0.7 11/04/2023 0510   EOSABS 0.0 11/04/2023 0510   BASOSABS 0.0 11/04/2023 0510      Latest Ref Rng & Units 11/06/2023    4:15 AM 11/04/2023    5:10 AM 11/03/2023    6:40 AM  BMP  Glucose 70 - 99  mg/dL 962  952  841   BUN 6 - 20 mg/dL 11  <5  7   Creatinine 0.44 - 1.00 mg/dL 3.24  4.01  0.27   Sodium 135 - 145 mmol/L 134  136  134   Potassium 3.5 - 5.1 mmol/L 4.4  4.3  4.4   Chloride 98 - 111 mmol/L 104  104  102   CO2 22 - 32 mmol/L 24  27  21    Calcium  8.9 - 10.3 mg/dL 8.0  7.7  7.9     UE Venous Duplex ([facility] and [facility] ONLY) Result Date: 11/04/2023 UPPER VENOUS STUDY  Patient Name:  [name]  Date of Exam:   11/04/2023 Medical Rec #: [id number]       Accession #:    [id number] Date of Birth: [birth year]      Patient Gender: F Patient Age:   56 years Exam Location:  [facility] Procedure:      VAS US  UPPER EXTREMITY VENOUS DUPLEX Referring Phys: Rosealee Concha --------------------------------------------------------------------------------  Indications: Pain, and Swelling Limitations: Patient movement. Comparison Study: No previous exams Performing Technologist: Jody Hill RVT, RDMS  Examination Guidelines: A complete evaluation includes B-mode imaging, spectral Doppler, color Doppler, and power Doppler as needed of all accessible portions of each vessel. Bilateral testing is considered an integral part of a complete examination. Limited examinations for reoccurring indications may be performed as noted.  Right Findings: +----------+------------+---------+-----------+----------+--------------------+ RIGHT     CompressiblePhasicitySpontaneousProperties      Summary        +----------+------------+---------+-----------+----------+--------------------+ Subclavian               Yes       Yes                   patent by                                                              color/doppler     +----------+------------+---------+-----------+----------+--------------------+  Left Findings: +----------+------------+---------+-----------+----------+--------------+ LEFT      CompressiblePhasicitySpontaneousProperties   Summary      +----------+------------+---------+-----------+----------+--------------+ IJV           Full       Yes       Yes                             +----------+------------+---------+-----------+----------+--------------+  Subclavian    Full       Yes       Yes                             +----------+------------+---------+-----------+----------+--------------+ Axillary      Full       Yes       Yes                             +----------+------------+---------+-----------+----------+--------------+ Brachial      Full       Yes       Yes                             +----------+------------+---------+-----------+----------+--------------+ Radial        Full                                                 +----------+------------+---------+-----------+----------+--------------+ Ulnar         None       No        No                   Acute      +----------+------------+---------+-----------+----------+--------------+ Cephalic                                            Not visualized +----------+------------+---------+-----------+----------+--------------+ Basilic       Full       Yes       Yes                             +----------+------------+---------+-----------+----------+--------------+  Summary:  Right: No evidence of thrombosis in the subclavian.  Left: No evidence of superficial vein thrombosis in the upper extremity. Findings consistent with acute deep vein thrombosis involving the left ulnar veins.  *See table(s) above for measurements and observations.  Diagnosing physician: Jimmye Moulds MD Electronically signed by Jimmye Moulds MD on 11/04/2023 at 2:17:10 PM.    Final     Disposition Plan & Communication  Patient status: Inpatient  Admitted From: Home Planned disposition location: TBD Anticipated discharge date: TBD pending anemia, alcohol  withdrawal   Family Communication: family member on phone call    Author: Ree Candy,  DO [facility] 11/06/2023, 7:51 AM   Available by Epic secure chat 7AM-7PM. If 7PM-7AM, please contact night-coverage.  [facility] contact information found on [email or web address].

## 2023-11-06 NOTE — Progress Notes (Signed)
  Progress Note   Date: 11/06/2023  Patient Name: [name]        MRN#: [id number]  Clarification of diagnosis: Treated for alcohol  withdrawal No prior diagnosis of Wernicke's

## 2023-11-07 DIAGNOSIS — F10931 Alcohol use, unspecified with withdrawal delirium: Secondary | ICD-10-CM | POA: Diagnosis not present

## 2023-11-07 LAB — CBC
HCT: 29.7 % — ABNORMAL LOW (ref 36.0–46.0)
Hemoglobin: 8.4 g/dL — ABNORMAL LOW (ref 12.0–15.0)
MCH: 21.1 pg — ABNORMAL LOW (ref 26.0–34.0)
MCHC: 28.3 g/dL — ABNORMAL LOW (ref 30.0–36.0)
MCV: 74.6 fL — ABNORMAL LOW (ref 80.0–100.0)
Platelets: 229 10*3/uL (ref 150–400)
RBC: 3.98 MIL/uL (ref 3.87–5.11)
RDW: 21.2 % — ABNORMAL HIGH (ref 11.5–15.5)
WBC: 6.9 10*3/uL (ref 4.0–10.5)
nRBC: 0 % (ref 0.0–0.2)

## 2023-11-07 LAB — HEPARIN LEVEL (UNFRACTIONATED): Heparin Unfractionated: 0.15 [IU]/mL — ABNORMAL LOW (ref 0.30–0.70)

## 2023-11-07 LAB — GLUCOSE, CAPILLARY
Glucose-Capillary: 95 mg/dL (ref 70–99)
Glucose-Capillary: 190 mg/dL — ABNORMAL HIGH (ref 70–99)
Glucose-Capillary: 94 mg/dL (ref 70–99)

## 2023-11-07 MED ORDER — LORAZEPAM 2 MG/ML IJ SOLN
0.5000 mg | INTRAMUSCULAR | Status: AC
  Administered 2023-11-07: 0.5 mg via INTRAVENOUS
  Filled 2023-11-07: qty 1

## 2023-11-07 MED ORDER — LEVETIRACETAM (KEPPRA) 500 MG/5 ML ADULT IV PUSH
500.0000 mg | Freq: Two times a day (BID) | INTRAVENOUS | Status: DC
  Administered 2023-11-07 – 2023-11-13 (×13): 500 mg via INTRAVENOUS
  Filled 2023-11-07 (×13): qty 5

## 2023-11-07 MED ORDER — LORAZEPAM 2 MG/ML IJ SOLN
2.0000 mg | Freq: Once | INTRAMUSCULAR | Status: DC
  Filled 2023-11-07: qty 1

## 2023-11-07 MED ORDER — ENOXAPARIN SODIUM 100 MG/ML IJ SOSY
90.0000 mg | PREFILLED_SYRINGE | INTRAMUSCULAR | Status: DC
  Filled 2023-11-07: qty 0.9

## 2023-11-07 MED ORDER — ENOXAPARIN SODIUM 100 MG/ML IJ SOSY
90.0000 mg | PREFILLED_SYRINGE | INTRAMUSCULAR | Status: DC
  Administered 2023-11-07 – 2023-11-08 (×2): 90 mg via SUBCUTANEOUS
  Filled 2023-11-07 (×2): qty 0.9

## 2023-11-07 NOTE — Progress Notes (Signed)
 PHARMACY - ANTICOAGULATION CONSULT NOTE  Pharmacy Consult for heparin  Indication: DVT  Allergies  Allergen Reactions   Iron  Dextran Shortness Of Breath and Anxiety   Aspirin  Nausea And Vomiting and Other (See Comments)    Ok to take tylenol  or ibuprofen     Penicillins Other (See Comments)    Unknown reaction from childhood    Patient Measurements: Height: 5\' 2"  (157.5 cm) Weight: 57.5 kg (126 lb 12.2 oz) IBW/kg (Calculated) : 50.1 HEPARIN  DW (KG): 49  Vital Signs: Temp: 98.7 F (37.1 C) (05/17 1211) Temp Source: Oral (05/17 1211) BP: 115/73 (05/17 1211) Pulse Rate: 89 (05/17 0931)  Labs: Recent Labs    11/05/23 0450 11/05/23 0537 11/05/23 2051 11/06/23 0415 11/06/23 0600 11/07/23 0417  HGB 8.6*  --   --  7.8*  --  8.4*  HCT 29.5*  --   --  27.2*  --  29.7*  PLT 314  --   --  306  --  229  HEPARINUNFRC  --    < > 0.37  --  0.45 0.15*  CREATININE  --   --   --  0.76  --   --    < > = values in this interval not displayed.    Estimated Creatinine Clearance: 59.9 mL/min (by C-G formula based on SCr of 0.76 mg/dL).   Medical History: Past Medical History:  Diagnosis Date   Abdominal pain    Accidental drug overdose April 2013   Anxiety    Atrial fibrillation (HCC) 09/29/11   converted spontaneously   Chronic back pain    Chronic knee pain    Chronic nausea    Chronic pain    COPD (chronic obstructive pulmonary disease) (HCC)    Depression    Diabetes mellitus    states her doctor took her off all DM meds in past month   Diabetic neuropathy (HCC)    Dyspnea    with exertion    GERD (gastroesophageal reflux disease)    Headache(784.0)    migraines    HTN (hypertension)    not on meds since in a year    Hyperlipidemia    Hypothyroidism    not on meds in a while    Mental disorder    Bipolar and schizophrenic   Nausea and vomiting 01/02/2023   Requires supplemental oxygen     as needed per patient    Schizophrenia (HCC)    Schizophrenia, acute (HCC)  11/13/2017   Tobacco abuse     Assessment: 31 YOF presenting with anemia, also swelling in left upper extremity, doppler positive for DVT, workup for GI bleed negative and concern for iron  deficiency anemia, discussed with MD and will omit bolus and start conservative heparin  gtt. Pertinent PMH of PAF not anticoagulated given fall risk due to etoh abuse.   5/17 PM: Patient refusing lab draws. Spoke with [facility] MD and will transition patient from heparin  to lovenox .  Today, CBC stable (Hgb 8.4 and PLT 229).   Goal of Therapy:  Heparin  level 0.3-0.5 units/ml Monitor platelets by anticoagulation protocol: Yes   Plan:  Stop heparin  gtt Start lovenox  90 mg [place] Q24H - administer first dose at the same time heparin  infusion is stopped Monitor daily heparin  level and CBC Continue to monitor H&H   F/u long term [facility] plan   Chrystie Crass, PharmD Clinical Pharmacist  11/07/2023 3:37 PM

## 2023-11-07 NOTE — Progress Notes (Signed)
 PHARMACY - ANTICOAGULATION CONSULT NOTE  Pharmacy Consult for heparin  Indication: DVT  Allergies  Allergen Reactions   Iron  Dextran Shortness Of Breath and Anxiety   Aspirin  Nausea And Vomiting and Other (See Comments)    Ok to take tylenol  or ibuprofen     Penicillins Other (See Comments)    Unknown reaction from childhood    Patient Measurements: Height: 5\' 2"  (157.5 cm) Weight: 57.5 kg (126 lb 12.2 oz) IBW/kg (Calculated) : 50.1 HEPARIN  DW (KG): 49  Vital Signs: Temp: 98.7 F (37.1 C) (05/17 0656) Temp Source: Oral (05/17 0656) BP: 131/66 (05/17 0400) Pulse Rate: 90 (05/17 0700)  Labs: Recent Labs    11/05/23 0450 11/05/23 0537 11/05/23 2051 11/06/23 0415 11/06/23 0600 11/07/23 0417  HGB 8.6*  --   --  7.8*  --  8.4*  HCT 29.5*  --   --  27.2*  --  29.7*  PLT 314  --   --  306  --  229  HEPARINUNFRC  --    < > 0.37  --  0.45 0.15*  CREATININE  --   --   --  0.76  --   --    < > = values in this interval not displayed.    Estimated Creatinine Clearance: 59.9 mL/min (by C-G formula based on SCr of 0.76 mg/dL).   Medical History: Past Medical History:  Diagnosis Date   Abdominal pain    Accidental drug overdose April 2013   Anxiety    Atrial fibrillation (HCC) 09/29/11   converted spontaneously   Chronic back pain    Chronic knee pain    Chronic nausea    Chronic pain    COPD (chronic obstructive pulmonary disease) (HCC)    Depression    Diabetes mellitus    states her doctor took her off all DM meds in past month   Diabetic neuropathy (HCC)    Dyspnea    with exertion    GERD (gastroesophageal reflux disease)    Headache(784.0)    migraines    HTN (hypertension)    not on meds since in a year    Hyperlipidemia    Hypothyroidism    not on meds in a while    Mental disorder    Bipolar and schizophrenic   Nausea and vomiting 01/02/2023   Requires supplemental oxygen     as needed per patient    Schizophrenia (HCC)    Schizophrenia, acute (HCC)  11/13/2017   Tobacco abuse     Assessment: 42 YOF presenting with anemia, also swelling in left upper extremity, doppler positive for DVT, workup for GI bleed negative and concern for iron  deficiency anemia, discussed with MD and will omit bolus and start conservative heparin  gtt. Pertinent PMH of PAF not anticoagulated given fall risk due to etoh abuse.   5/17 AM: Heparin  level 0.15, subtherapeutic on heparin  1050 units/hr. Noted heparin  infusion was stopped at 0406 and resumed at 0417. RN not aware of any issues with heparin  infusion running overnight. No bleeding noted. Will not bolus and keep heparin  level within a low goal due to history of anemia and requiring 2u pRBC this admission. Today, CBC stable (Hgb 8.4 and PLT 229).   Goal of Therapy:  Heparin  level 0.3-0.5 units/ml Monitor platelets by anticoagulation protocol: Yes   Plan:  Increase heparin  gtt to 1150 units/hr Recheck heparin  level in 6 hours Monitor daily heparin  level and CBC Continue to monitor H&H   F/u long term AC plan  Thank you for involving pharmacy in this patient's care.  Volney Grumbles, PharmD PGY-1 Acute Care Pharmacy Resident 11/07/2023 7:33 AM

## 2023-11-07 NOTE — Plan of Care (Signed)

## 2023-11-07 NOTE — Progress Notes (Signed)
 PROGRESS NOTE  [name]    DOB: 11-24-1965, 58 y.o.  AVW:[id number]    Code Status: Full Code   DOA: 11/04/2023   LOS: 3   Brief hospital course  [name] is a 58 y.o. female with a PMH significant for anxiety and depression, chronic pain, COPD, tobacco use 1 pack/day, COPD, schizophrenia, hypothyroid, hypertension, hyperlipidemia, GERD, diabetes, chronic anemia, PAF not anticoagulated with fall risk, history of PE, cocaine use disorder, alcohol  use disorder, seizure disorder versus alcohol  withdrawal seizure history.  Who presented to the emergency department with low hemoglobin and Dts from alcohol  withdrawals.   Patient was admitted to medicine service for further workup and management of alcohol  withdrawal as outlined in detail below.  11/07/23 -still in acute withdrawals. Hemoglobin gradually decreasing still s/p transfusion   Assessment & Plan  Principal Problem:   Alcohol  withdrawal (HCC)  EtOH use disorder  EtOH withdrawal with severe DTs -cont phenobarb taper per protocol -sz precautions -cont high dose thiamine  folate, MV  -BID keppra  500mg  Zonisamide  200mg  BID  -- reports she has not been taking these at home  - PRN ativan , will add extra dosing of IV Ativan  today given severe withdrawals.  She is in withdrawals today requiring 4 points restraints  Acute metabolic/toxic encephalopathy -Appears multifactorial, due to alcohol  withdrawals and seizures/postictal  Seizures -Due to alcohol  abuse/withdrawals and medication noncompliance -Neurology input appreciated, continue with Keppra , will change to IV as she is encephalitis pathic today and may not cooperate with p.o.   AoC anemia- Fe deficiency  -2 PRBC 5/14 -no s/sx GIB  - Monitor CBC closely and transfuse as needed.   LUE DVT  -notably hx PE and Afib, not on AC at home 2/2 fall risk  -hep gtt for now. Poor candidate for  long term anticoagulation with frequent falls with alcohol  use.    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  VTE ppx: SCDs Start: 11/04/23 1310   Diet:     Diet   Diet regular Room service appropriate? Yes with Assist; Fluid consistency: Thin   Consultants: CCM  Subjective 11/07/23    Patient with increased agitation and restlessness requiring extra IV Ativan  dosing   Objective   Vitals:   11/07/23 0656 11/07/23 0700 11/07/23 0931 11/07/23 1211  BP:    115/73  Pulse:  90 89   Resp:  20 19 17   Temp: 98.7 F (37.1 C)   98.7 F (37.1 C)  TempSrc: Oral   Oral  SpO2:  94% 95%   Weight:      Height:        Intake/Output Summary (Last 24 hours) at 11/07/2023 1400 Last data filed at 11/07/2023 1300 Gross per 24 hour  Intake 1365.84 ml  Output 1500 ml  Net -134.16 ml   [employer]  11/05/23 0413 11/06/23 0500 11/07/23 0500  Weight: 55.2 kg 56 kg 57.5 kg    Physical exam Awake, confused, restless and anxious Symmetrical Chest wall movement,  RRR,No Gallops,Rubs or new Murmurs, No Parasternal Heave +ve B.Sounds, Abd Soft, No tenderness, No rebound - guarding or rigidity. No Cyanosis, Clubbing or edema, No new Rash or bruise    Labs   I have personally reviewed the following labs and imaging studies CBC    Component Value Date/Time   WBC 6.9 11/07/2023 0417   RBC 3.98 11/07/2023 0417   HGB 8.4 (L) 11/07/2023 0417   HCT 29.7 (L) 11/07/2023 0417   PLT 229  11/07/2023 0417   MCV 74.6 (L) 11/07/2023 0417   MCH 21.1 (L) 11/07/2023 0417   MCHC 28.3 (L) 11/07/2023 0417   RDW 21.2 (H) 11/07/2023 0417   LYMPHSABS 1.3 11/04/2023 0510   MONOABS 0.7 11/04/2023 0510   EOSABS 0.0 11/04/2023 0510   BASOSABS 0.0 11/04/2023 0510      Latest Ref Rng & Units 11/06/2023    4:15 AM 11/04/2023    5:10 AM 11/03/2023    6:40 AM  BMP  Glucose 70 - 99 mg/dL 324  401  027   BUN 6 - 20 mg/dL 11  <5  7   Creatinine 0.44 - 1.00 mg/dL 2.53  6.64  4.03   Sodium 135 - 145 mmol/L 134  136  134   Potassium 3.5 - 5.1 mmol/L 4.4  4.3  4.4   Chloride 98 - 111 mmol/L 104  104  102   CO2 22 - 32 mmol/L 24  27  21    Calcium  8.9 - 10.3 mg/dL 8.0  7.7  7.9     No results found.   Disposition Plan & Communication  Patient status: Inpatient  Admitted From: Home Planned disposition location: TBD Anticipated discharge date: TBD pending anemia, alcohol  withdrawal   Family Communication: None at bedside, D/W daughter by phone.   Author: Seena Dadds, MD [facility] 11/07/2023, 2:00 PM   Available by Epic secure chat 7AM-7PM. If 7PM-7AM, please contact night-coverage.  [facility] contact information found on [email or web address].

## 2023-11-08 DIAGNOSIS — F10931 Alcohol use, unspecified with withdrawal delirium: Secondary | ICD-10-CM | POA: Diagnosis not present

## 2023-11-08 LAB — MAGNESIUM: Magnesium: 1.8 mg/dL (ref 1.7–2.4)

## 2023-11-08 LAB — BASIC METABOLIC PANEL WITH GFR
Anion gap: 11 (ref 5–15)
BUN: 5 mg/dL — ABNORMAL LOW (ref 6–20)
CO2: 19 mmol/L — ABNORMAL LOW (ref 22–32)
Calcium: 8.1 mg/dL — ABNORMAL LOW (ref 8.9–10.3)
Chloride: 104 mmol/L (ref 98–111)
Creatinine, Ser: 0.73 mg/dL (ref 0.44–1.00)
GFR, Estimated: >60 mL/min (ref >60)
Glucose, Bld: 215 mg/dL — ABNORMAL HIGH (ref 70–99)
Potassium: 3.9 mmol/L (ref 3.5–5.1)
Sodium: 134 mmol/L — ABNORMAL LOW (ref 135–145)

## 2023-11-08 LAB — HEPATIC FUNCTION PANEL
ALT: 18 U/L (ref 0–44)
AST: 29 U/L (ref 15–41)
Albumin: 2.7 g/dL — ABNORMAL LOW (ref 3.5–5.0)
Alkaline Phosphatase: 61 U/L (ref 38–126)
Bilirubin, Direct: <0.1 mg/dL (ref 0.0–0.2)
Total Bilirubin: 0.4 mg/dL (ref 0.0–1.2)
Total Protein: 6.1 g/dL — ABNORMAL LOW (ref 6.5–8.1)

## 2023-11-08 LAB — CBC
HCT: 29.5 % — ABNORMAL LOW (ref 36.0–46.0)
Hemoglobin: 8.3 g/dL — ABNORMAL LOW (ref 12.0–15.0)
MCH: 21.5 pg — ABNORMAL LOW (ref 26.0–34.0)
MCHC: 28.1 g/dL — ABNORMAL LOW (ref 30.0–36.0)
MCV: 76.4 fL — ABNORMAL LOW (ref 80.0–100.0)
Platelets: 278 10*3/uL (ref 150–400)
RBC: 3.86 MIL/uL — ABNORMAL LOW (ref 3.87–5.11)
RDW: 22 % — ABNORMAL HIGH (ref 11.5–15.5)
WBC: 6.6 10*3/uL (ref 4.0–10.5)
nRBC: 0 % (ref 0.0–0.2)

## 2023-11-08 LAB — TYPE AND SCREEN
ABO/RH(D): A POS
Antibody Screen: POSITIVE
Unit division: 0
Unit division: 0
Unit division: 0

## 2023-11-08 LAB — BPAM RBC
Blood Product Expiration Date: 202506122359
Blood Product Expiration Date: 202506122359
Blood Product Expiration Date: 202506172359
ISSUE DATE / TIME: 202505151506
Unit Type and Rh: 600
Unit Type and Rh: 6200
Unit Type and Rh: 6200

## 2023-11-08 LAB — GLUCOSE, CAPILLARY
Glucose-Capillary: 102 mg/dL — ABNORMAL HIGH (ref 70–99)
Glucose-Capillary: 218 mg/dL — ABNORMAL HIGH (ref 70–99)
Glucose-Capillary: 169 mg/dL — ABNORMAL HIGH (ref 70–99)

## 2023-11-08 LAB — PHOSPHORUS: Phosphorus: 4.7 mg/dL — ABNORMAL HIGH (ref 2.5–4.6)

## 2023-11-08 MED ORDER — LORAZEPAM 2 MG/ML IJ SOLN
2.0000 mg | Freq: Once | INTRAMUSCULAR | Status: AC
  Administered 2023-11-08: 2 mg via INTRAVENOUS
  Filled 2023-11-08: qty 1

## 2023-11-08 MED ORDER — LORAZEPAM 2 MG/ML IJ SOLN
2.0000 mg | INTRAMUSCULAR | Status: AC | PRN
  Administered 2023-11-08 – 2023-11-10 (×12): 2 mg via INTRAVENOUS
  Filled 2023-11-08 (×13): qty 1

## 2023-11-08 MED ORDER — K PHOS MONO-SOD PHOS DI & MONO 155-852-130 MG PO TABS
500.0000 mg | ORAL_TABLET | Freq: Two times a day (BID) | ORAL | Status: AC
  Administered 2023-11-08 (×2): 500 mg via ORAL
  Filled 2023-11-08 (×2): qty 2

## 2023-11-08 MED ORDER — LORAZEPAM 2 MG/ML IJ SOLN
1.0000 mg | INTRAMUSCULAR | Status: DC | PRN
Start: 2023-11-08 — End: 2023-11-08
  Administered 2023-11-08: 1 mg via INTRAVENOUS
  Filled 2023-11-08: qty 1

## 2023-11-08 NOTE — Plan of Care (Signed)
  Problem: Clinical Measurements: Goal: Ability to maintain clinical measurements within normal limits will improve Outcome: Progressing Goal: Will remain free from infection Outcome: Progressing   Problem: Activity: Goal: Risk for activity intolerance will decrease Outcome: Progressing   Problem: Coping: Goal: Level of anxiety will decrease Outcome: Progressing   Problem: Safety: Goal: Ability to remain free from injury will improve Outcome: Progressing   Problem: Skin Integrity: Goal: Risk for impaired skin integrity will decrease Outcome: Progressing

## 2023-11-08 NOTE — Progress Notes (Signed)
 Refused vital signs to be taken

## 2023-11-08 NOTE — Plan of Care (Signed)

## 2023-11-08 NOTE — Progress Notes (Signed)
 PROGRESS NOTE  [name]    DOB: October 11, 1965, 58 y.o.  [id number]    Code Status: Full Code   DOA: 11/04/2023   LOS: 4   Brief hospital course  [name] is a 58 y.o. female with a PMH significant for anxiety and depression, chronic pain, COPD, tobacco use 1 pack/day, COPD, schizophrenia, hypothyroid, hypertension, hyperlipidemia, GERD, diabetes, chronic anemia, PAF not anticoagulated with fall risk, history of PE, cocaine use disorder, alcohol  use disorder, seizure disorder versus alcohol  withdrawal seizure history.  Who presented to the emergency department with low hemoglobin and Dts from alcohol  withdrawals.   Patient was admitted to medicine service for further workup and management of alcohol  withdrawal as outlined in detail below.  11/08/23 -still in acute withdrawals. Hemoglobin gradually decreasing still s/p transfusion   Assessment & Plan  Principal Problem:   Alcohol  withdrawal (HCC)  EtOH use disorder  EtOH withdrawal with severe DTs -cont phenobarb taper per protocol -sz precautions -cont high dose thiamine  folate, MV  -BID keppra  500mg  Zonisamide  200mg  BID  -- reports she has not been taking these at home  - PRN ativan , her mentation significantly fluctuate, required extra multiple dosing of IV Ativan  yesterday, required to go back on 4 point restraint later this morning.  .  Acute metabolic/toxic encephalopathy -Appears multifactorial, due to alcohol  withdrawals and seizures/postictal  Seizures -Due to alcohol  abuse/withdrawals and medication noncompliance -Neurology input appreciated, continue with Keppra , changed to IV as she is she has been refusing some p.o. meds intermittently.   AoC anemia- Fe deficiency  -2 PRBC 5/14 -no s/sx GIB  - Monitor CBC closely and transfuse as needed.   LUE DVT  -notably hx PE and Afib, not on AC at home 2/2 fall risk  -hep gtt for now. Poor candidate for  long term anticoagulation with frequent falls with alcohol   use.    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  Elevated LFTs -PPI -PRN LFTs    COPD Chronic hypoxic resp failure  Tobacco use disorder  Continues 1 pack/day, appears to be on 4 to 5 L a minute as needed home O2 -duoneb  -nicotine  patch -IS, pulm hygiene, mobility  -O2 for SpO2 88-92 -encourage smoking cessation and edu on dangers of smoking on oxygen     DM2  -holding home metformin , cont SSI    Depression Anxiety Schizophrenia -Abilify     Medication non-adherence Fall risk  -TOC consult   -PT consult  Patient was noted to have positive Treponema pallidum antibodies, and reactive RPR with one-to-one titer during recent hospitalization, upon reviewing previous records it does appear that this was positive as well in 2022, upon asking question today, patient reports she has not been treated for this before, and unaware of these findings, will consult ID tomorrow regarding further recommendations.  Body mass index is 23.19 kg/m.  VTE ppx: SCDs Start: 11/04/23 1310   Diet:     Diet   Diet regular Room service appropriate? Yes with Assist; Fluid consistency: Thin   Consultants: CCM  Subjective 11/08/23    Patient with increased agitation and restlessness requiring extra IV Ativan  dosing   Objective   Vitals:   11/08/23 0855 11/08/23 1100 11/08/23 1207 11/08/23 1211  BP: 119/71  98/69 98/69  Pulse: (!) 115  (!) 102 (!) 102  Resp: 18 19 18    Temp: 97.7 F (36.5 C)  97.9 F (36.6  C)   TempSrc: Oral  Oral   SpO2: 95%  97%   Weight:      Height:       No intake or output data in the 24 hours ending 11/08/23 1428  Filed Weights   11/06/23 0500 11/07/23 0500 11/08/23 0500  Weight: 56 kg 57.5 kg 57.5 kg    Physical exam  Awake Alert, pleasant, with significant fluctuation of mental status were later she was confused restless and anxious Symmetrical Chest wall movement, Good air  movement bilaterally, CTAB RRR,No Gallops,Rubs or new Murmurs, No Parasternal Heave +ve B.Sounds, Abd Soft, No tenderness, No rebound - guarding or rigidity. No Cyanosis, Clubbing or edema, No new Rash or bruise     Labs   I have personally reviewed the following labs and imaging studies CBC    Component Value Date/Time   WBC 6.9 11/07/2023 0417   RBC 3.98 11/07/2023 0417   HGB 8.4 (L) 11/07/2023 0417   HCT 29.7 (L) 11/07/2023 0417   PLT 229 11/07/2023 0417   MCV 74.6 (L) 11/07/2023 0417   MCH 21.1 (L) 11/07/2023 0417   MCHC 28.3 (L) 11/07/2023 0417   RDW 21.2 (H) 11/07/2023 0417   LYMPHSABS 1.3 11/04/2023 0510   MONOABS 0.7 11/04/2023 0510   EOSABS 0.0 11/04/2023 0510   BASOSABS 0.0 11/04/2023 0510      Latest Ref Rng & Units 11/06/2023    4:15 AM 11/04/2023    5:10 AM 11/03/2023    6:40 AM  BMP  Glucose 70 - 99 mg/dL 147  829  562   BUN 6 - 20 mg/dL 11  <5  7   Creatinine 0.44 - 1.00 mg/dL 1.30  8.65  7.84   Sodium 135 - 145 mmol/L 134  136  134   Potassium 3.5 - 5.1 mmol/L 4.4  4.3  4.4   Chloride 98 - 111 mmol/L 104  104  102   CO2 22 - 32 mmol/L 24  27  21    Calcium  8.9 - 10.3 mg/dL 8.0  7.7  7.9     No results found.   Disposition Plan & Communication  Patient status: Inpatient  Admitted From: Home Planned disposition location: TBD Anticipated discharge date: TBD pending anemia, alcohol  withdrawal   Family Communication: None at bedside, D/W daughter by phone.   Author: Seena Dadds, MD [facility] 11/08/2023, 2:28 PM   Available by Epic secure chat 7AM-7PM. If 7PM-7AM, please contact night-coverage.  [facility] contact information found on [email or web address].

## 2023-11-09 DIAGNOSIS — Z8619 Personal history of other infectious and parasitic diseases: Secondary | ICD-10-CM | POA: Insufficient documentation

## 2023-11-09 DIAGNOSIS — F10931 Alcohol use, unspecified with withdrawal delirium: Secondary | ICD-10-CM | POA: Diagnosis not present

## 2023-11-09 LAB — CBC
HCT: 27.7 % — ABNORMAL LOW (ref 36.0–46.0)
Hemoglobin: 8.1 g/dL — ABNORMAL LOW (ref 12.0–15.0)
MCH: 21.6 pg — ABNORMAL LOW (ref 26.0–34.0)
MCHC: 29.2 g/dL — ABNORMAL LOW (ref 30.0–36.0)
MCV: 73.9 fL — ABNORMAL LOW (ref 80.0–100.0)
Platelets: UNDETERMINED 10*3/uL (ref 150–400)
RBC: 3.75 MIL/uL — ABNORMAL LOW (ref 3.87–5.11)
RDW: 21.7 % — ABNORMAL HIGH (ref 11.5–15.5)
WBC: 6.5 10*3/uL (ref 4.0–10.5)
nRBC: 0 % (ref 0.0–0.2)

## 2023-11-09 LAB — GLUCOSE, CAPILLARY
Glucose-Capillary: 245 mg/dL — ABNORMAL HIGH (ref 70–99)
Glucose-Capillary: 199 mg/dL — ABNORMAL HIGH (ref 70–99)
Glucose-Capillary: 165 mg/dL — ABNORMAL HIGH (ref 70–99)
Glucose-Capillary: 103 mg/dL — ABNORMAL HIGH (ref 70–99)

## 2023-11-09 LAB — BASIC METABOLIC PANEL WITH GFR
Anion gap: 12 (ref 5–15)
BUN: 7 mg/dL (ref 6–20)
CO2: 20 mmol/L — ABNORMAL LOW (ref 22–32)
Calcium: 8 mg/dL — ABNORMAL LOW (ref 8.9–10.3)
Chloride: 103 mmol/L (ref 98–111)
Creatinine, Ser: 0.51 mg/dL (ref 0.44–1.00)
GFR, Estimated: >60 mL/min (ref >60)
Glucose, Bld: 115 mg/dL — ABNORMAL HIGH (ref 70–99)
Potassium: 4 mmol/L (ref 3.5–5.1)
Sodium: 135 mmol/L (ref 135–145)

## 2023-11-09 LAB — PHOSPHORUS: Phosphorus: 6.1 mg/dL — ABNORMAL HIGH (ref 2.5–4.6)

## 2023-11-09 LAB — MAGNESIUM: Magnesium: 2 mg/dL (ref 1.7–2.4)

## 2023-11-09 MED ORDER — ENOXAPARIN SODIUM 80 MG/0.8ML IJ SOSY
80.0000 mg | PREFILLED_SYRINGE | INTRAMUSCULAR | Status: DC
  Administered 2023-11-09 – 2023-11-13 (×5): 80 mg via SUBCUTANEOUS
  Filled 2023-11-09 (×5): qty 0.8

## 2023-11-09 MED ORDER — BENZONATATE 100 MG PO CAPS
200.0000 mg | ORAL_CAPSULE | Freq: Three times a day (TID) | ORAL | Status: DC | PRN
  Administered 2023-11-10 – 2023-11-12 (×2): 200 mg via ORAL
  Filled 2023-11-09 (×3): qty 2

## 2023-11-09 NOTE — Progress Notes (Signed)
 PROGRESS NOTE  [name]    DOB: 12-20-1965, 58 y.o.  [id number]    Code Status: Full Code   DOA: 11/04/2023   LOS: 5   Brief hospital course  [name] is a 58 y.o. female with a PMH significant for anxiety and depression, chronic pain, COPD, tobacco use 1 pack/day, COPD, schizophrenia, hypothyroid, hypertension, hyperlipidemia, GERD, diabetes, chronic anemia, PAF not anticoagulated with fall risk, history of PE, cocaine use disorder, alcohol  use disorder, seizure disorder versus alcohol  withdrawal seizure history.  Who presented to the emergency department with low hemoglobin and Dts from alcohol  withdrawals.   Patient was admitted to medicine service for further workup and management of alcohol  withdrawal as outlined in detail below.  11/09/23 -still in acute withdrawals. Hemoglobin gradually decreasing still s/p transfusion   Assessment & Plan  Principal Problem:   Alcohol  withdrawal (HCC) Active Problems:   History of syphilis  EtOH use disorder  EtOH withdrawal with severe DTs -cont phenobarb taper per protocol -sz precautions -cont high dose thiamine  folate, MV  -BID keppra  500mg  Zonisamide  200mg  BID  -- reports she has not been taking these at home  - Continue with phenobarbital  protocol, still requiring significant amount of as needed IV Ativan , intermittently still requiring restraints as well.    Acute metabolic/toxic encephalopathy -Appears multifactorial, due to alcohol  withdrawals and seizures/postictal  Seizures -Due to alcohol  abuse/withdrawals and medication noncompliance -Neurology input appreciated, continue with Keppra , changed to IV as she is she has been refusing some p.o. meds intermittently.   AoC anemia- Fe deficiency  -2 PRBC 5/14 -no s/sx GIB  - Monitor CBC closely and transfuse as needed.   LUE DVT  -notably hx PE and Afib, not on AC at home 2/2 fall risk  -hep gtt for now. Poor candidate for  long term anticoagulation with frequent  falls with alcohol  use.    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  Elevated LFTs -PPI -PRN LFTs    COPD Chronic hypoxic resp failure  Tobacco use disorder  Continues 1 pack/day, appears to be on 4 to 5 L a minute as needed home O2 -duoneb  -nicotine  patch -IS, pulm hygiene, mobility  -O2 for SpO2 88-92 -encourage smoking cessation and edu on dangers of smoking on oxygen     DM2  -holding home metformin , cont SSI    Depression Anxiety Schizophrenia -Abilify     Medication non-adherence Fall risk  -TOC consult   -PT consult  Patient was noted to have positive Treponema pallidum antibodies, and reactive RPR  - I have discussed with ID, gratefully they has followed up on these results and they called [facility]:  ID note "she was treated 11/04/1994 with a titer of 1:128 per health department records - this is most c/w old infection that seems to have been well treated. I don't think Neurosyphilis is a concern with records I reviewed"    Body mass index is 22.66 kg/m.  VTE ppx: SCDs Start: 11/04/23 1310   Diet:     Diet   Diet regular Room service appropriate? Yes with Assist; Fluid consistency: Thin   Consultants: CCM  Subjective 11/09/23    Did require yesterday afternoon to go back on for pointes restraint, still requiring frequent as needed dosing of Ativan .   Objective   Vitals:   11/09/23 0400 11/09/23 0500 11/09/23 1110 11/09/23 1200  BP: 129/73  Aaron Aas)  118/50   Pulse: 93 92 100 100  Resp:   (!) 21 (!) 25  Temp:   98 F (36.7 C)   TempSrc:   Oral   SpO2:   96% 92%  Weight:      Height:       No intake or output data in the 24 hours ending 11/09/23 1420  Filed Weights   11/07/23 0500 11/08/23 0500 11/09/23 0307  Weight: 57.5 kg 57.5 kg 56.2 kg    Physical exam  Awake Alert, pleasant, with significant fluctuation of mental status were later she was confused  restless and anxious Symmetrical Chest wall movement, Good air movement bilaterally, CTAB RRR,No Gallops,Rubs or new Murmurs, No Parasternal Heave +ve B.Sounds, Abd Soft, No tenderness, No rebound - guarding or rigidity. No Cyanosis, Clubbing or edema, No new Rash or bruise     Labs   I have personally reviewed the following labs and imaging studies CBC    Component Value Date/Time   WBC 6.5 11/09/2023 0927   RBC 3.75 (L) 11/09/2023 0927   HGB 8.1 (L) 11/09/2023 0927   HCT 27.7 (L) 11/09/2023 0927   PLT PLATELET CLUMPS NOTED ON SMEAR, UNABLE TO ESTIMATE 11/09/2023 0927   MCV 73.9 (L) 11/09/2023 0927   MCH 21.6 (L) 11/09/2023 0927   MCHC 29.2 (L) 11/09/2023 0927   RDW 21.7 (H) 11/09/2023 0927   LYMPHSABS 1.3 11/04/2023 0510   MONOABS 0.7 11/04/2023 0510   EOSABS 0.0 11/04/2023 0510   BASOSABS 0.0 11/04/2023 0510      Latest Ref Rng & Units 11/09/2023    3:37 AM 11/08/2023    2:30 PM 11/06/2023    4:15 AM  BMP  Glucose 70 - 99 mg/dL 161  096  045   BUN 6 - 20 mg/dL 7  5  11    Creatinine 0.44 - 1.00 mg/dL 4.09  8.11  9.14   Sodium 135 - 145 mmol/L 135  134  134   Potassium 3.5 - 5.1 mmol/L 4.0  3.9  4.4   Chloride 98 - 111 mmol/L 103  104  104   CO2 22 - 32 mmol/L 20  19  24    Calcium  8.9 - 10.3 mg/dL 8.0  8.1  8.0     No results found.   Disposition Plan & Communication  Patient status: Inpatient  Admitted From: Home Planned disposition location: TBD Anticipated discharge date: TBD pending anemia, alcohol  withdrawal   Family Communication: None at bedside, D/W daughter by phone    Author: Seena Dadds, MD [facility] 11/09/2023, 2:20 PM   Available by Epic secure chat 7AM-7PM. If 7PM-7AM, please contact night-coverage.  [facility] contact information found on [email or web address].

## 2023-11-09 NOTE — Plan of Care (Signed)
  Problem: Education: Goal: Ability to describe self-care measures that may prevent or decrease complications (Diabetes Survival Skills Education) will improve Outcome: Not Progressing Goal: Individualized Educational Video(s) Outcome: Not Progressing   Problem: Coping: Goal: Ability to adjust to condition or change in health will improve Outcome: Not Progressing   Problem: Fluid Volume: Goal: Ability to maintain a balanced intake and output will improve Outcome: Not Progressing   Problem: Health Behavior/Discharge Planning: Goal: Ability to identify and utilize available resources and services will improve Outcome: Not Progressing Goal: Ability to manage health-related needs will improve Outcome: Not Progressing   Problem: Metabolic: Goal: Ability to maintain appropriate glucose levels will improve Outcome: Not Progressing   Problem: Nutritional: Goal: Maintenance of adequate nutrition will improve Outcome: Not Progressing Goal: Progress toward achieving an optimal weight will improve Outcome: Not Progressing   Problem: Tissue Perfusion: Goal: Adequacy of tissue perfusion will improve Outcome: Not Progressing   Problem: Education: Goal: Knowledge of General Education information will improve Description: Including pain rating scale, medication(s)/side effects and non-pharmacologic comfort measures Outcome: Not Progressing

## 2023-11-09 NOTE — Plan of Care (Signed)
  Problem: Education: Goal: Knowledge of General Education information will improve Description: Including pain rating scale, medication(s)/side effects and non-pharmacologic comfort measures Outcome: Progressing   Problem: Clinical Measurements: Goal: Will remain free from infection Outcome: Progressing Goal: Respiratory complications will improve Outcome: Progressing   Problem: Activity: Goal: Risk for activity intolerance will decrease Outcome: Progressing   Problem: Coping: Goal: Level of anxiety will decrease Outcome: Progressing   Problem: Safety: Goal: Ability to remain free from injury will improve Outcome: Progressing   Problem: Skin Integrity: Goal: Risk for impaired skin integrity will decrease Outcome: Progressing

## 2023-11-09 NOTE — Progress Notes (Signed)
 Wrist restraints removed at this time.  Patient educated on requirements to remain out of restraints including using her callbell when needing assistance.  Patient states understanding and is in the bed with siderails up x4, callbell in reach and bed alarm on.  Fall mats in place on either side of  the bed.

## 2023-11-09 NOTE — Care Management Important Message (Signed)
 Important Message  Patient Details  Name: [name] MRN: [id number] Date of Birth: 1965-09-12   Important Message Given:  Yes - Medicare IM     Wynonia Hedges 11/09/2023, 3:52 PM

## 2023-11-09 NOTE — Progress Notes (Signed)
 [facility] night cross cover note:  Prn Tessalon Perles added for cough.   Newton Pigg, DO Hospitalist

## 2023-11-09 NOTE — Progress Notes (Signed)
 PHARMACY - ANTICOAGULATION CONSULT NOTE  Pharmacy Consult for heparin >>lovenox  Indication: DVT  Allergies  Allergen Reactions   Iron  Dextran Shortness Of Breath and Anxiety   Aspirin  Nausea And Vomiting and Other (See Comments)    Ok to take tylenol  or ibuprofen      Patient Measurements: Height: 5\' 2"  (157.5 cm) Weight: 56.2 kg (123 lb 14.4 oz) IBW/kg (Calculated) : 50.1 HEPARIN  DW (KG): 49  Vital Signs: Temp: 97.7 F (36.5 C) (05/19 0307) Temp Source: Axillary (05/19 0307) BP: 129/73 (05/19 0400) Pulse Rate: 92 (05/19 0500)  Labs: Recent Labs    11/07/23 0417 11/08/23 1430 11/09/23 0337  HGB 8.4* 8.3*  --   HCT 29.7* 29.5*  --   PLT 229 278  --   HEPARINUNFRC 0.15*  --   --   CREATININE  --  0.73 0.51    Estimated Creatinine Clearance: 59.9 mL/min (by C-G formula based on SCr of 0.51 mg/dL).   Medical History: Past Medical History:  Diagnosis Date   Abdominal pain    Accidental drug overdose April 2013   Anxiety    Atrial fibrillation (HCC) 09/29/11   converted spontaneously   Chronic back pain    Chronic knee pain    Chronic nausea    Chronic pain    COPD (chronic obstructive pulmonary disease) (HCC)    Depression    Diabetes mellitus    states her doctor took her off all DM meds in past month   Diabetic neuropathy (HCC)    Dyspnea    with exertion    GERD (gastroesophageal reflux disease)    Headache(784.0)    migraines    HTN (hypertension)    not on meds since in a year    Hyperlipidemia    Hypothyroidism    not on meds in a while    Mental disorder    Bipolar and schizophrenic   Nausea and vomiting 01/02/2023   Requires supplemental oxygen     as needed per patient    Schizophrenia (HCC)    Schizophrenia, acute (HCC) 11/13/2017   Tobacco abuse     Assessment: 22 YOF presenting with anemia, also swelling in left upper extremity, doppler positive for DVT, workup for GI bleed negative and concern for iron  deficiency anemia, discussed with  MD and will omit bolus and start conservative heparin  gtt. Pertinent PMH of PAF not anticoagulated given fall risk due to etoh abuse.   Pt is now on Lovenox  due to refusal of lab draws. Hgb around 8s, plt wnl.   Goal of Therapy:  Heparin  level 0.3-0.5 units/ml Monitor platelets by anticoagulation protocol: Yes   Plan:  Change Lovenox  to 80mg  SQ qday F/u long term [facility] plan  Ivery Marking, PharmD, BCIDP, AAHIVP, CPP Infectious Disease Pharmacist 11/09/2023 7:30 AM

## 2023-11-09 NOTE — Plan of Care (Signed)
 Id brief note  Patient here for etoh withdrawal. Has an rpr checked and primary team called to see if further w/u needed   We called [facility]: "she was treated 11/04/1994 with a titer of 1:128 per health department records - this is most c/w old infection that seems to have been well treated. 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

## 2023-11-10 ENCOUNTER — Inpatient Hospital Stay (HOSPITAL_COMMUNITY)

## 2023-11-10 DIAGNOSIS — F10931 Alcohol use, unspecified with withdrawal delirium: Secondary | ICD-10-CM | POA: Diagnosis not present

## 2023-11-10 LAB — BASIC METABOLIC PANEL WITH GFR
Anion gap: 10 (ref 5–15)
BUN: 6 mg/dL (ref 6–20)
CO2: 22 mmol/L (ref 22–32)
Calcium: 8.9 mg/dL (ref 8.9–10.3)
Chloride: 101 mmol/L (ref 98–111)
Creatinine, Ser: 0.56 mg/dL (ref 0.44–1.00)
GFR, Estimated: >60 mL/min (ref >60)
Glucose, Bld: 161 mg/dL — ABNORMAL HIGH (ref 70–99)
Potassium: 4.4 mmol/L (ref 3.5–5.1)
Sodium: 133 mmol/L — ABNORMAL LOW (ref 135–145)

## 2023-11-10 LAB — PROCALCITONIN: Procalcitonin: <0.1 ng/mL

## 2023-11-10 LAB — PHOSPHORUS: Phosphorus: 4 mg/dL (ref 2.5–4.6)

## 2023-11-10 LAB — TROPONIN I (HIGH SENSITIVITY)
Troponin I (High Sensitivity): 3 ng/L (ref <18)
Troponin I (High Sensitivity): 4 ng/L (ref <18)

## 2023-11-10 LAB — GLUCOSE, CAPILLARY
Glucose-Capillary: 114 mg/dL — ABNORMAL HIGH (ref 70–99)
Glucose-Capillary: 155 mg/dL — ABNORMAL HIGH (ref 70–99)
Glucose-Capillary: 144 mg/dL — ABNORMAL HIGH (ref 70–99)

## 2023-11-10 LAB — CBC
HCT: 30.9 % — ABNORMAL LOW (ref 36.0–46.0)
Hemoglobin: 8.9 g/dL — ABNORMAL LOW (ref 12.0–15.0)
MCH: 21.7 pg — ABNORMAL LOW (ref 26.0–34.0)
MCHC: 28.8 g/dL — ABNORMAL LOW (ref 30.0–36.0)
MCV: 75.2 fL — ABNORMAL LOW (ref 80.0–100.0)
Platelets: 279 10*3/uL (ref 150–400)
RBC: 4.11 MIL/uL (ref 3.87–5.11)
RDW: 22 % — ABNORMAL HIGH (ref 11.5–15.5)
WBC: 11 10*3/uL — ABNORMAL HIGH (ref 4.0–10.5)
nRBC: 0 % (ref 0.0–0.2)

## 2023-11-10 LAB — MAGNESIUM: Magnesium: 2 mg/dL (ref 1.7–2.4)

## 2023-11-10 LAB — BRAIN NATRIURETIC PEPTIDE: B Natriuretic Peptide: 12.5 pg/mL (ref 0.0–100.0)

## 2023-11-10 MED ORDER — LORAZEPAM 2 MG/ML IJ SOLN
1.0000 mg | INTRAMUSCULAR | Status: AC | PRN
  Administered 2023-11-10: 2 mg via INTRAVENOUS
  Administered 2023-11-10: 3 mg via INTRAVENOUS
  Administered 2023-11-11 (×3): 2 mg via INTRAVENOUS
  Administered 2023-11-12 (×2): 4 mg via INTRAVENOUS
  Administered 2023-11-13: 2 mg via INTRAVENOUS
  Filled 2023-11-10 (×3): qty 1
  Filled 2023-11-10 (×2): qty 2
  Filled 2023-11-10 (×2): qty 1
  Filled 2023-11-10: qty 2

## 2023-11-10 MED ORDER — DIAZEPAM 5 MG/ML IJ SOLN
5.0000 mg | Freq: Once | INTRAMUSCULAR | Status: AC
Start: 2023-11-10 — End: 2023-11-10
  Administered 2023-11-10: 5 mg via INTRAVENOUS
  Filled 2023-11-10: qty 2

## 2023-11-10 MED ORDER — BUTALBITAL-APAP-CAFFEINE 50-325-40 MG PO TABS
1.0000 | ORAL_TABLET | Freq: Four times a day (QID) | ORAL | Status: AC | PRN
  Administered 2023-11-10 – 2023-11-13 (×3): 1 via ORAL
  Filled 2023-11-10 (×3): qty 1

## 2023-11-10 MED ORDER — LORAZEPAM 1 MG PO TABS
1.0000 mg | ORAL_TABLET | ORAL | Status: AC | PRN
  Administered 2023-11-11: 3 mg via ORAL
  Administered 2023-11-11: 4 mg via ORAL
  Administered 2023-11-12: 2 mg via ORAL
  Filled 2023-11-10: qty 4
  Filled 2023-11-10: qty 1
  Filled 2023-11-10 (×2): qty 2

## 2023-11-10 NOTE — Progress Notes (Signed)
 PROGRESS NOTE  [name]    DOB: January 20, 1966, 58 y.o.  ZOX:[id number]    Code Status: Full Code   DOA: 11/04/2023   LOS: 6   Brief hospital course   [name] is a 58 y.o. female with a PMH significant for anxiety and depression, chronic pain, COPD, tobacco use 1 pack/day, COPD, schizophrenia, hypothyroid, hypertension, hyperlipidemia, GERD, diabetes, chronic anemia, PAF not anticoagulated with fall risk, history of PE, cocaine use disorder, alcohol  use disorder, seizure disorder versus alcohol  withdrawal seizure history.  Who presented to the emergency department with low hemoglobin and Dts from alcohol  withdrawals.   Patient was admitted to medicine service for further workup and management of alcohol  withdrawal as outlined in detail below.  11/10/23 -still in acute withdrawals. Hemoglobin gradually decreasing still s/p transfusion   Assessment & Plan  Principal Problem:   Alcohol  withdrawal (HCC) Active Problems:   History of syphilis  EtOH use disorder  EtOH withdrawal with severe DTs -cont phenobarb taper per protocol -sz precautions -cont high dose thiamine  folate, MV  -BID keppra  500mg  Zonisamide  200mg  BID  -- reports she has not been taking these at home  - Continue with phenobarbital  protocol, still requiring significant amount of as needed IV Ativan , intermittently still requiring restraints as well.    Acute metabolic/toxic encephalopathy -Appears multifactorial, due to alcohol  withdrawals and seizures/postictal  Seizures -Due to alcohol  abuse/withdrawals and medication noncompliance -Neurology input appreciated, continue with Keppra , changed to IV as she is she has been refusing some p.o. meds intermittently.   AoC anemia- Fe deficiency  -2 PRBC 5/14 -no s/sx GIB  - Monitor CBC closely and transfuse as needed.   LUE DVT  -notably hx PE and Afib, not on AC at home 2/2 fall risk  -hep gtt for now. Poor candidate for  long term anticoagulation with  frequent falls with alcohol  use.    pAF  HTN -hep gtt in setting of acute LUE DVT above, has been refusing labs pulling her IV intermittently, so switched to Lovenox  -optimize lytes    Alcoholic gastritis without hemorrhage  Elevated LFTs -PPI -PRN LFTs    COPD Chronic hypoxic resp failure  Tobacco use disorder  Continues 1 pack/day, appears to be on 4 to 5 L a minute as needed home O2 -duoneb  -nicotine  patch -IS, pulm hygiene, mobility  -O2 for SpO2 88-92 -encourage smoking cessation and edu on dangers of smoking on oxygen     DM2  -holding home metformin , cont SSI    Depression Anxiety Schizophrenia -Abilify     Medication non-adherence Fall risk  -TOC consult   -PT consult  Patient was noted to have positive Treponema pallidum antibodies, and reactive RPR  - I have discussed with ID, gratefully they has followed up on these results and they called [facility]:  ID note "she was treated 11/04/1994 with a titer of 1:128 per health department records - this is most c/w old infection that seems to have been well treated. I don't think Neurosyphilis is a concern with records I reviewed"    Body mass index is 22.66 kg/m.  VTE ppx: Lovenox  full dose  Diet:     Diet   Diet regular Fluid consistency: Thin   Consultants: CCM  Subjective 11/10/23    He has been intermittently confused this morning, had to go back on restraints this afternoon   Objective   Vitals:   11/10/23 0800 11/10/23 0843 11/10/23 1204 11/10/23 1555  BP:  131/68 94/60  111/66  Pulse: 94 (!) 102 97 (!) 108  Resp:  15 16 20   Temp:  98.4 F (36.9 C) (!) 97.3 F (36.3 C) 99.2 F (37.3 C)  TempSrc:  Oral Oral Oral  SpO2:  100%  91%  Weight:      Height:        Intake/Output Summary (Last 24 hours) at 11/10/2023 1630 Last data filed at 11/09/2023 1800 Gross per 24 hour  Intake 240 ml  Output --  Net 240 ml    Filed Weights   11/08/23 0500 11/09/23 0307 11/10/23 0500   Weight: 57.5 kg 56.2 kg 56.2 kg    Physical exam  Awake Alert, pleasant, with significant fluctuation of mental status were later she was confused restless and anxious Symmetrical Chest wall movement, Good air movement bilaterally, CTAB RRR,No Gallops,Rubs or new Murmurs, No Parasternal Heave +ve B.Sounds, Abd Soft, No tenderness, No rebound - guarding or rigidity. No Cyanosis, Clubbing or edema, No new Rash or bruise      Labs   I have personally reviewed the following labs and imaging studies CBC    Component Value Date/Time   WBC 11.0 (H) 11/10/2023 0931   RBC 4.11 11/10/2023 0931   HGB 8.9 (L) 11/10/2023 0931   HCT 30.9 (L) 11/10/2023 0931   PLT 279 11/10/2023 0931   MCV 75.2 (L) 11/10/2023 0931   MCH 21.7 (L) 11/10/2023 0931   MCHC 28.8 (L) 11/10/2023 0931   RDW 22.0 (H) 11/10/2023 0931   LYMPHSABS 1.3 11/04/2023 0510   MONOABS 0.7 11/04/2023 0510   EOSABS 0.0 11/04/2023 0510   BASOSABS 0.0 11/04/2023 0510      Latest Ref Rng & Units 11/10/2023    9:31 AM 11/09/2023    3:37 AM 11/08/2023    2:30 PM  BMP  Glucose 70 - 99 mg/dL 027  253  664   BUN 6 - 20 mg/dL 6  7  5    Creatinine 0.44 - 1.00 mg/dL 4.03  4.74  2.59   Sodium 135 - 145 mmol/L 133  135  134   Potassium 3.5 - 5.1 mmol/L 4.4  4.0  3.9   Chloride 98 - 111 mmol/L 101  103  104   CO2 22 - 32 mmol/L 22  20  19    Calcium  8.9 - 10.3 mg/dL 8.9  8.0  8.1     DG Chest Port 1 View Result Date: 11/10/2023 CLINICAL DATA:  10031 Cough 10031 EXAM: PORTABLE CHEST 1 VIEW COMPARISON:  Chest x-ray 11/04/2023, CT chest 06/28/2023 FINDINGS: Patient is rotated. The heart and mediastinal contours are unchanged. Atherosclerotic plaque. Low lung volumes. No focal consolidation. No pulmonary edema. No pleural effusion. No pneumothorax. No acute osseous abnormality. IMPRESSION: Low lung volumes with no active disease. Patient is rotated. Recommend repeat PA and lateral view of the chest for further evaluation. Electronically  Signed   By: Morgane  Naveau M.D.   On: 11/10/2023 00:29     Disposition Plan & Communication  Patient status: Inpatient  Admitted From: Home Planned disposition location: TBD Anticipated discharge date: TBD pending anemia, alcohol  withdrawal   Family Communication: None at bedside, D/W daughter by phone 5/19   Author: Seena Dadds, MD [facility] 11/10/2023, 4:30 PM   Available by Epic secure chat 7AM-7PM. If 7PM-7AM, please contact night-coverage.  [facility] contact information found on [email or web address].

## 2023-11-10 NOTE — Progress Notes (Signed)
 [facility] night cross cover note:   I was notified by the patient's RN that this patient experienced desaturation into the 80s on room air, with oxygen  saturation subsequent proving into the mid to high 90s on 2 L nasal cannula.  Aside from the patient's ongoing cough, she is otherwise without acute respiratory complaints at this time, including no shortness of breath.  No report of any associated chest pain.   Other vital signs at this time were notable for the following: Afebrile, heart rates in the 90s; most recent blood pressure 105/73, RR in the mid 20s to low 30s, which is notable as being remains on phenobarbital  taper in the setting of alcohol  withdrawal.   As there was concern that the patient's cough was worse after attempting to drink, I initially made the patient n.p.o., pending nursing bedside swallow screen, which the patient subsequently passed, after which I resumed the patient's regular diet.  In light of her ongoing cough and new oxygen  requirement, I ordered chest x-ray, which showed no evidence of acute cardiopulmonary process, including no report of infiltrate, edema, effusion, or pneumothorax.  I also ordered procalcitonin level, BNP and noted existing orders for magnesium  and phosphorus level to occur with morning labs.  Per RN, patient show some evidence of restlessness, agitation, and has recently received dose of 2 mg of IV Ativan  per existing prn order in the context of her alcohol  withdrawal.  Appears that she also has a history of schizophrenia.  Current orders also include scheduled Abilify , scheduled zonisamide . At this time, I placed a one time order for Valium  5 mg iv x 1 now.    Camelia Cavalier, DO Hospitalist

## 2023-11-10 NOTE — Progress Notes (Signed)
 [facility] night cross cover note:   I was notified by the patient's RN that, regarding this patient who is hospitalized with alcohol  withdrawal, that the patient's prn Ativan  as well as phenobarbital  for etoh withdrawal have fallen off the [facility], although the patient remains agitated, restless.  For now, I have renewed the as needed Ativan  via [facility] protocol.     Camelia Cavalier, DO Hospitalist

## 2023-11-10 NOTE — Progress Notes (Addendum)
 Mobility Specialist: Progress Note   11/10/23 1152  Mobility  Activity Ambulated with assistance in room  Level of Assistance Standby assist, set-up cues, supervision of patient - no hands on  Assistive Device None  Activity Response Tolerated well  Mobility Referral  --   Mobility visit 1 Mobility  Mobility Specialist Start Time (ACUTE ONLY) 1050  Mobility Specialist Stop Time (ACUTE ONLY) 1056  Mobility Specialist Time Calculation (min) (ACUTE ONLY) 6 min    Pt received ambulating in room, bed alarm going off. Redirected pt to the chair and educated her on not getting up without assist. SV throughout. Left in chair with all needs met, call bell in reach. Chair alarm on.   Deloria Fetch Mobility Specialist Please contact via SecureChat or Rehab office at [phone]

## 2023-11-10 NOTE — Plan of Care (Signed)
  Problem: Clinical Measurements: Goal: Ability to maintain clinical measurements within normal limits will improve Outcome: Progressing Goal: Will remain free from infection Outcome: Progressing Goal: Respiratory complications will improve Outcome: Progressing   Problem: Activity: Goal: Risk for activity intolerance will decrease Outcome: Progressing   Problem: Safety: Goal: Ability to remain free from injury will improve Outcome: Progressing

## 2023-11-10 NOTE — Progress Notes (Signed)
@  2345 Patient has started continuous coughing after drinking some ice and water , sats come down to 86%in RA. Placed on 2L [place]. Made MD Howerter, aware. Gave breathing treatment. Placed the stat order for DG chest xray and kept on NPO until SLP evaluation.  Will continue the plan of care.

## 2023-11-11 ENCOUNTER — Inpatient Hospital Stay (HOSPITAL_COMMUNITY)

## 2023-11-11 DIAGNOSIS — D649 Anemia, unspecified: Secondary | ICD-10-CM | POA: Diagnosis not present

## 2023-11-11 DIAGNOSIS — I82622 Acute embolism and thrombosis of deep veins of left upper extremity: Secondary | ICD-10-CM | POA: Diagnosis not present

## 2023-11-11 DIAGNOSIS — F10931 Alcohol use, unspecified with withdrawal delirium: Secondary | ICD-10-CM | POA: Diagnosis not present

## 2023-11-11 DIAGNOSIS — F10930 Alcohol use, unspecified with withdrawal, uncomplicated: Secondary | ICD-10-CM | POA: Diagnosis not present

## 2023-11-11 LAB — HEPATIC FUNCTION PANEL
ALT: 19 U/L (ref 0–44)
AST: 32 U/L (ref 15–41)
Albumin: 2.9 g/dL — ABNORMAL LOW (ref 3.5–5.0)
Alkaline Phosphatase: 51 U/L (ref 38–126)
Bilirubin, Direct: <0.1 mg/dL (ref 0.0–0.2)
Total Bilirubin: <0.2 mg/dL (ref 0.0–1.2)
Total Protein: 6.4 g/dL — ABNORMAL LOW (ref 6.5–8.1)

## 2023-11-11 LAB — MAGNESIUM: Magnesium: 2.1 mg/dL (ref 1.7–2.4)

## 2023-11-11 LAB — GLUCOSE, CAPILLARY
Glucose-Capillary: 144 mg/dL — ABNORMAL HIGH (ref 70–99)
Glucose-Capillary: 269 mg/dL — ABNORMAL HIGH (ref 70–99)

## 2023-11-11 LAB — BRAIN NATRIURETIC PEPTIDE: B Natriuretic Peptide: 9.9 pg/mL (ref 0.0–100.0)

## 2023-11-11 LAB — CBC
HCT: 28.4 % — ABNORMAL LOW (ref 36.0–46.0)
Hemoglobin: 8.1 g/dL — ABNORMAL LOW (ref 12.0–15.0)
MCH: 21.4 pg — ABNORMAL LOW (ref 26.0–34.0)
MCHC: 28.5 g/dL — ABNORMAL LOW (ref 30.0–36.0)
MCV: 74.9 fL — ABNORMAL LOW (ref 80.0–100.0)
Platelets: 271 10*3/uL (ref 150–400)
RBC: 3.79 MIL/uL — ABNORMAL LOW (ref 3.87–5.11)
RDW: 21.9 % — ABNORMAL HIGH (ref 11.5–15.5)
WBC: 4.8 10*3/uL (ref 4.0–10.5)
nRBC: 0 % (ref 0.0–0.2)

## 2023-11-11 LAB — BASIC METABOLIC PANEL WITH GFR
Anion gap: 8 (ref 5–15)
BUN: 8 mg/dL (ref 6–20)
CO2: 24 mmol/L (ref 22–32)
Calcium: 8.5 mg/dL — ABNORMAL LOW (ref 8.9–10.3)
Chloride: 105 mmol/L (ref 98–111)
Creatinine, Ser: 0.48 mg/dL (ref 0.44–1.00)
GFR, Estimated: >60 mL/min (ref >60)
Glucose, Bld: 126 mg/dL — ABNORMAL HIGH (ref 70–99)
Potassium: 3.8 mmol/L (ref 3.5–5.1)
Sodium: 137 mmol/L (ref 135–145)

## 2023-11-11 LAB — PROCALCITONIN: Procalcitonin: <0.1 ng/mL

## 2023-11-11 LAB — PHOSPHORUS: Phosphorus: 3.8 mg/dL (ref 2.5–4.6)

## 2023-11-11 MED ORDER — HALOPERIDOL LACTATE 5 MG/ML IJ SOLN
4.0000 mg | Freq: Four times a day (QID) | INTRAMUSCULAR | Status: DC | PRN
  Administered 2023-11-11 – 2023-11-13 (×5): 4 mg via INTRAVENOUS
  Filled 2023-11-11 (×5): qty 1

## 2023-11-11 MED ORDER — DIVALPROEX SODIUM 125 MG PO CSDR
250.0000 mg | DELAYED_RELEASE_CAPSULE | Freq: Two times a day (BID) | ORAL | Status: DC
  Administered 2023-11-11 – 2023-11-14 (×7): 250 mg via ORAL
  Filled 2023-11-11 (×7): qty 2

## 2023-11-11 MED ORDER — OXYCODONE HCL 5 MG PO TABS
5.0000 mg | ORAL_TABLET | Freq: Four times a day (QID) | ORAL | Status: DC | PRN
  Administered 2023-11-11 – 2023-11-14 (×6): 5 mg via ORAL
  Filled 2023-11-11 (×6): qty 1

## 2023-11-11 NOTE — Evaluation (Addendum)
 Clinical/Bedside Swallow Evaluation Patient Details  Name: [name] MRN: [id number] Date of Birth: 02/22/1966  Today's Date: 11/11/2023 Time: SLP Start Time (ACUTE ONLY): 0859 SLP Stop Time (ACUTE ONLY): 0913 SLP Time Calculation (min) (ACUTE ONLY): 14 min  Past Medical History:  Past Medical History:  Diagnosis Date   Abdominal pain    Accidental drug overdose April 2013   Anxiety    Atrial fibrillation (HCC) 09/29/11   converted spontaneously   Chronic back pain    Chronic knee pain    Chronic nausea    Chronic pain    COPD (chronic obstructive pulmonary disease) (HCC)    Depression    Diabetes mellitus    states her doctor took her off all DM meds in past month   Diabetic neuropathy (HCC)    Dyspnea    with exertion    GERD (gastroesophageal reflux disease)    Headache(784.0)    migraines    HTN (hypertension)    not on meds since in a year    Hyperlipidemia    Hypothyroidism    not on meds in a while    Mental disorder    Bipolar and schizophrenic   Nausea and vomiting 01/02/2023   Requires supplemental oxygen     as needed per patient    Schizophrenia (HCC)    Schizophrenia, acute (HCC) 11/13/2017   Tobacco abuse    Past Surgical History:  Past Surgical History:  Procedure Laterality Date   ABDOMINAL HYSTERECTOMY     BLADDER SUSPENSION  03/04/2011   Procedure: [facility] PROCEDURE;  Surgeon: Jackelyn Marvel MacDiarmid;  Location: [facility];  Service: Urology;  Laterality: N/A;   BOWEL RESECTION N/A 04/18/2022   Procedure: SMALL BOWEL RESECTION;  Surgeon: Sim Dryer, MD;  Location: [facility] OR;  Service: General;  Laterality: N/A;   CYSTOCELE REPAIR  03/04/2011   Procedure: ANTERIOR REPAIR (CYSTOCELE);  Surgeon: Devorah Fonder;  Location: [facility];  Service: Urology;  Laterality: N/A;   CYSTOSCOPY  03/04/2011   Procedure: CYSTOSCOPY;  Surgeon: Jackelyn Marvel MacDiarmid;  Location: [facility];  Service: Urology;  Laterality: N/A;   ESOPHAGOGASTRODUODENOSCOPY (EGD) WITH PROPOFOL  N/A  05/12/2017   Procedure: ESOPHAGOGASTRODUODENOSCOPY (EGD) WITH PROPOFOL ;  Surgeon: Juanita Norlander, MD;  Location: [facility];  Service: General;  Laterality: N/A;   GASTRIC ROUX-EN-Y N/A 03/25/2016   Procedure: LAPAROSCOPIC ROUX-EN-Y GASTRIC BYPASS WITH UPPER ENDOSCOPY;  Surgeon: Ayesha Lente, MD;  Location: [facility];  Service: General;  Laterality: N/A;   KNEE SURGERY     LAPAROSCOPIC ASSISTED VAGINAL HYSTERECTOMY  03/04/2011   Procedure: LAPAROSCOPIC ASSISTED VAGINAL HYSTERECTOMY;  Surgeon: Martine Sleek, MD;  Location: [facility];  Service: Gynecology;  Laterality: N/A;   LAPAROTOMY N/A 04/18/2022   Procedure: EXPLORATORY LAPAROTOMY;  Surgeon: Sim Dryer, MD;  Location: [facility];  Service: General;  Laterality: N/A;   LAPAROTOMY N/A 04/24/2022   Procedure: BRING BACK EXPLORATORY LAPAROTOMY;  Surgeon: Dorena Gander, MD;  [facility] OR;  Service: General;  Laterality: N/A;   TOTAL HIP ARTHROPLASTY Right 08/27/2022   Procedure: TOTAL HIP ARTHROPLASTY;  Surgeon: Murleen Arms, MD;  Location: [facility] OR;  Service: Orthopedics;  Laterality: Right;   HPI:  [name] is a 58 y.o. female with a PMH significant for anxiety and depression, chronic pain, COPD, tobacco use 1 pack/day, COPD, schizophrenia, hypothyroid, hypertension, hyperlipidemia, GERD, diabetes, chronic anemia, PAF not anticoagulated with fall risk, history of PE, cocaine use disorder, alcohol  use disorder, seizure disorder versus alcohol  withdrawal seizure history.  Admitted for ETOH withdrawal. Per chart pt eating cake and tv dinner at 0215 and started coughing and developed expiratory wheezes. RN got report that pt was "shoveling cake in her mouth." CXR this morning Right basilar atelectasis.    Assessment / Plan / Recommendation  Clinical Impression  Pt's swallow evaluated and suspect episode at 0215 this morning was an isolated incident. Pt reported to therapist "I was eating too fast" and reported to be observed by RN. She  is missing teeth scattered in oral cavity with poor hygiene however masticated regular over 3 trials timely without residue needing min cues to not verbalize when masticating. There was no concern with decreased airway protection. She did have one delayed cough during assessment and states she has been coughing at baseline. Her history of COPD increases risk however she did not have significant increased work of breathing. Recommend  initiate regular texture, thin liquids, single pills with water  and intermittent supervision for slow rate, upright position. Pt does not require follow up at this time but reconsult if difficulties arise. SLP Visit Diagnosis: Dysphagia, unspecified (R13.10)    Aspiration Risk  Mild aspiration risk    Diet Recommendation Regular;Thin liquid    Liquid Administration via: Straw;Cup Medication Administration: Whole meds with liquid Supervision: Patient able to self feed;Intermittent supervision to cue for compensatory strategies (for slow rate) Compensations: Minimize environmental distractions;Slow rate;Small sips/bites Postural Changes: Seated upright at 90 degrees    Other  Recommendations Oral Care Recommendations: Oral care BID    Recommendations for follow up therapy are one component of a multi-disciplinary discharge planning process, led by the attending physician.  Recommendations may be updated based on patient status, additional functional criteria and insurance authorization.  Follow up Recommendations No SLP follow up      Assistance Recommended at Discharge    Functional Status Assessment Patient has not had a recent decline in their functional status  Frequency and Duration            Prognosis        Swallow Study   General HPI: [name] is a 58 y.o. female with a PMH significant for anxiety and depression, chronic pain, COPD, tobacco use 1 pack/day, COPD, schizophrenia, hypothyroid, hypertension, hyperlipidemia, GERD, diabetes,  chronic anemia, PAF not anticoagulated with fall risk, history of PE, cocaine use disorder, alcohol  use disorder, seizure disorder versus alcohol  withdrawal seizure history. Admitted for ETOH withdrawal. Per chart pt eating cake and tv dinner at 0215 and started coughing and developed expiratory wheezes. RN got report that pt was "shoveling cake in her mouth." Type of Study: Bedside Swallow Evaluation Previous Swallow Assessment:  (none) Diet Prior to this Study: NPO Temperature Spikes Noted: No Respiratory Status: Room air History of Recent Intubation: No Behavior/Cognition: Alert;Cooperative;Pleasant mood;Requires cueing Oral Cavity Assessment: Within Functional Limits Oral Care Completed by SLP: No Oral Cavity - Dentition: Missing dentition;Poor condition Vision: Functional for self-feeding Self-Feeding Abilities: Able to feed self Patient Positioning: Upright in bed Baseline Vocal Quality: Normal Volitional Cough: Strong Volitional Swallow: Able to elicit    Oral/Motor/Sensory Function Overall Oral Motor/Sensory Function: Within functional limits   Ice Chips Ice chips: Not tested   Thin Liquid Thin Liquid: Within functional limits Presentation: Cup;Straw    Nectar Thick Nectar Thick Liquid: Not tested   Honey Thick Honey Thick Liquid: Not tested   Puree Puree: Within functional limits   Solid     Solid: Within functional limits      Aleda Ammon, Nola Battiest  11/11/2023,9:31 AM

## 2023-11-11 NOTE — Progress Notes (Addendum)
 [facility] night cross cover note:   I was notified by the patient's RN that patient's oxygen  saturation decreased to the mid 80s shortly after the patient had some food, with oxygen  saturation improving in the mid 90s with 2 L nasal cannula. HR at this time is noted to be slightly higher, appearing to be associated with sinus tach, with HR's into the low 100's. She is also demonstrating some evidence of expiratory wheezes. Given timing of desaturation and similar episodes of post-prandial hypoxia during this hospitalization, there is concern for potential recurrent aspiration. Will check chest x-ray at this time, make pt NPO, and order ST consult for formal swallow evaluation in setting of concern for recurrent aspiration.  She has existing orders for scheduled arformoterol  and budesonide , as well as order for prn duonebulizer treatment, and will receive a treatment of the latter at this time.   I have also added procalcitonin level as well as BNP to the morning labs.    Camelia Cavalier, DO Hospitalist

## 2023-11-11 NOTE — Progress Notes (Signed)
@   0215 - Patient eating cake and a TV dinner and began to aspirate. Patient started coughing and developed expiratory wheezes. Sats dropped to 86% on RA. Patient unable to cough up food. Patient placed on 2L O2, given a duoneb, and CXR was obtained per Dr. Brock Canner order. R.N. made patient NPO and this information was relayed back to the hospitalist. Patient now satting at 94% on 2L and expiratory wheezes have resolved. Aside from the CXR, no new orders at this time.

## 2023-11-11 NOTE — Consult Note (Signed)
 [facility] Psychiatric Consult Initial  Patient Name: .[name]  MRN: [id number]  DOB: [birth year]  Consult Order details:  Orders (From admission, onward)     Start     Ordered   06/12/23 0826  CONSULT TO CALL ACT TEAM       Ordering Provider: Jerilynn Montenegro, MD  Provider:  (Not yet assigned)  Question Answer Comment  Place call to: ACT   Reason for Consult Admit      06/12/23 0825   06/11/23 1913  CONSULT TO CALL ACT TEAM       Ordering Provider: Carin Charleston, MD  Provider:  (Not yet assigned)  Question:  Reason for Consult?  Answer:  IVC, agitation   06/11/23 1912             Mode of Visit: In person    Psychiatry Consult Evaluation  Service Date: Nov 11, 2023 LOS:  LOS: 7 days  Chief Complaint agitation  Primary Psychiatric Diagnoses  Polysubstance abuse 2.   MDD 3.   Anxiety  Assessment  [name] is a 58 y.o. female admitted: Presented to the [facility] 11/04/2023  7:38 AM for agitation due to alcohol  intoxication. She carries the psychiatric diagnoses of MDD and has a past medical history of  diabetes and seizure disorder.   Patient seen today calm and cooperative.  Patient sat down in her room and engaged in meaningful conversation although she is confused. She ruminates on how hungry she is and how she would like more food. "She said I had a ham and " patient unable to finish the sentence but the provider completed the sentence with ham and cheese sandwich in which she said yes. Patient is able to tell me that she is at [facility] due to alcohol  use. We again reviewed the negative consequences of alcohol , which she agreed. She then changed the subject back to food.   Patient is AA female, 58 years old who came in to the ER intoxicated.  She has required prn restraints and agitation medication throughout this admission. She continues to have some confusion and paranoia although she is well outside the window for alcohol  detox. Today patient is much more  calm and cooperative.While she is within the time window to have  alcohol  hallucinosis in the setting of alcohol  withdrawal, I believe that this is less likely given her clinical picture with stable vital signs and lack of withdrawal symptoms this morning. Her clinical picture again indicates that she is being adequately treated with appropriate medication regimen.  She denies any suicidal ideations at this time, and or homicidal ideations.   After thorough evaluation and review of information currently present on examination, there are insufficient findings to indicate patient meets criteria for IVC or requires an inpatient level of care. Patient remains linear, irrelevant, and organized; denies SI/HI and is not currently significantly impaired, psychotic, or manic on exam. Detailed risk assessment is complete based on clinical exam and individual risk factors and acute suicide risk is low and acute violence risk is low.   Diagnoses:  Active Hospital problems: Principal Problem:   Alcohol  withdrawal (HCC) Active Problems:   History of syphilis    Plan   ## Psychiatric Medication Recommendations:  -WIll restart Depakote  -Continue High dose IV thiamine .  -Continue phenobarb taper.   ## Medical Decision Making Capacity:  Patient is her own legal guardian   ## Further Work-up: NA   ## Disposition:-- No barriers to discharge at this  time. May discharge with outpatient resources to [facility] once medically stable.    ## Safety and Observation Level:  - Based on my clinical evaluation, I estimate the patient to be at low risk of self harm in the current setting. - At this time, we recommend  routine. This decision is based on my review of the chart including patient's history and current presentation, interview of the patient, mental status examination, and consideration of suicide risk including evaluating suicidal ideation, plan, intent, suicidal or self-harm behaviors, risk factors, and  protective factors. This judgment is based on our ability to directly address suicide risk, implement suicide prevention strategies, and develop a safety plan while the patient is in the clinical setting. Please contact our team if there is a concern that risk level has changed.  CSSR Risk Category:C-SSRS RISK CATEGORY: No Risk  Suicide Risk Assessment: Patient has following modifiable risk factors for suicide: untreated depression, recklessness, and medication noncompliance, which we are addressing by encouraging patient to join [employer], Seek outpatient alcohol  treatment.  Patient has following non-modifiable or demographic risk factors for suicide: separation or divorce, history of self harm behavior, and psychiatric hospitalization Patient has the following protective factors against suicide: Supportive family and Supportive friends  Thank you for this consult request. Recommendations have been communicated to the primary team.  We will continue to follow from a distance.   Rella Cardinal, FNP-PMHNP-BC       History of Present Illness  Relevant Aspects of Hospital ED Course:   .Patient seen today calm and cooperative.  Patient sat down in her room and engaged in meaningful conversation.  She was ambulating with nursing staff when I entered the unit. Patient did not recognizie this provider despite multiple encounters with her over the years. She states " I was drunk when I came in here sweetie." She denies any acute complaints despite being hungry.    Collateral information:  Na  Review of Systems  Constitutional: Negative.   HENT: Negative.    Eyes: Negative.   Respiratory: Negative.    Cardiovascular: Negative.   Gastrointestinal: Negative.   Genitourinary: Negative.   Musculoskeletal: Negative.   Skin: Negative.   Neurological: Negative.   Endo/Heme/Allergies: Negative.   Psychiatric/Behavioral:  Positive for substance abuse.        Alcoholism     Psychiatric and  Social History  Psychiatric History:  Information collected from patient   Prev Dx/Sx: MDD, polysubstance abuse Current Psych Provider: None Home Meds (current): None Previous Med Trials: Prozac , does not take anymore Therapy: Past history  Prior Psych Hospitalization: Yes Prior Self Harm: Yes Prior Violence: Yes  Family Psych History: Patient does not know Family Hx suicide: Patient denies  Social History:  Developmental Hx: Patient appears to be appropriate developmental age but Physically patient appears older than stated age. Educational Hx: High school Occupational Hx: Unemployed Legal Hx: None per patient Living Situation: Lives with brother Spiritual Hx: That this Access to weapons/lethal means: None  Substance History Alcohol : Yes Type of alcohol  Beer Last Drink yesterday Number of drinks per day 1 to 2 (24 ounces) 3 times a week History of alcohol  withdrawal seizures patient denies History of DT's patient denies Tobacco: Yes Illicit drugs: Yes cocaine Prescription drug abuse: Patient denies Rehab hx: Patient denies  Exam Findings  Physical Exam:  Vital Signs:  Temp:  [97.3 F (36.3 C)-99.2 F (37.3 C)] 98.2 F (36.8 C) (05/21 0500) Pulse Rate:  [82-110] 91 (05/21 0806) Resp:  [  15-34] 20 (05/21 0806) BP: (92-143)/(61-76) 124/61 (05/21 0806) SpO2:  [91 %-98 %] 98 % (05/21 0806) FiO2 (%):  [92 %] 92 % (05/20 2200) Weight:  [57.1 kg] 57.1 kg (05/21 0500) Blood pressure 124/61, pulse 91, temperature 98.2 F (36.8 C), temperature source Oral, resp. rate 20, height 5\' 2"  (1.575 m), weight 57.1 kg, last menstrual period 01/08/2011, SpO2 98%. Body mass index is 23.02 kg/m.  Physical Exam Vitals (appears older than stated age.) and nursing note reviewed. Exam conducted with a chaperone present.  HENT:     Nose: Nose normal.  Pulmonary:     Effort: Pulmonary effort is normal.  Musculoskeletal:        General: Normal range of motion.  Skin:    General:  Skin is dry.  Neurological:     Mental Status: She is alert and oriented to person, place, and time.  Psychiatric:        Attention and Perception: Attention normal.        Speech: Speech normal.        Behavior: Behavior is cooperative.        Thought Content: Thought content normal.        Cognition and Memory: Cognition and memory normal.     Mental Status Exam: General Appearance: Casual and Neat  Orientation:  Full (Time, Place, and Person)  Memory:  Immediate;   Good Remote;   Good  Concentration:  Concentration: Good and Attention Span: Good  Recall:  Good  Attention  Good  Eye Contact:  Good  Speech:  Clear and Coherent  Language:  Good  Volume:  Normal  Mood: euthymic  Affect:  Appropriate  Thought Process:  Coherent  Thought Content:  WDL  Suicidal Thoughts:  No  Homicidal Thoughts:  No  Judgement:  Good  Insight:  Good  Psychomotor Activity:  Normal  Akathisia:  No  Fund of Knowledge:  Fair      Assets:  Manufacturing systems engineer Desire for Improvement Housing Social Support  Cognition:  WNL  ADL's:  Intact  AIMS (if indicated):        Other History   These have been pulled in through the EMR, reviewed, and updated if appropriate.  Family History:  The patient's family history includes COPD in an other family member; Diabetes in her mother; Heart attack in her father and sister; Heart disease in her mother; Hypertension in her mother.  Medical History: Past Medical History:  Diagnosis Date   Abdominal pain    Accidental drug overdose April 2013   Anxiety    Atrial fibrillation (HCC) 09/29/11   converted spontaneously   Chronic back pain    Chronic knee pain    Chronic nausea    Chronic pain    COPD (chronic obstructive pulmonary disease) (HCC)    Depression    Diabetes mellitus    states her doctor took her off all DM meds in past month   Diabetic neuropathy (HCC)    Dyspnea    with exertion    GERD (gastroesophageal reflux disease)     Headache(784.0)    migraines    HTN (hypertension)    not on meds since in a year    Hyperlipidemia    Hypothyroidism    not on meds in a while    Mental disorder    Bipolar and schizophrenic   Nausea and vomiting 01/02/2023   Requires supplemental oxygen     as needed per patient    Schizophrenia (  HCC)    Schizophrenia, acute (HCC) 11/13/2017   Tobacco abuse     Surgical History: Past Surgical History:  Procedure Laterality Date   ABDOMINAL HYSTERECTOMY     BLADDER SUSPENSION  03/04/2011   Procedure: [facility] PROCEDURE;  Surgeon: Jackelyn Marvel MacDiarmid;  Location: [facility];  Service: Urology;  Laterality: N/A;   BOWEL RESECTION N/A 04/18/2022   Procedure: SMALL BOWEL RESECTION;  Surgeon: Sim Dryer, MD;  Location: [facility] OR;  Service: General;  Laterality: N/A;   CYSTOCELE REPAIR  03/04/2011   Procedure: ANTERIOR REPAIR (CYSTOCELE);  Surgeon: Devorah Fonder;  Location: [facility];  Service: Urology;  Laterality: N/A;   CYSTOSCOPY  03/04/2011   Procedure: CYSTOSCOPY;  Surgeon: Jackelyn Marvel MacDiarmid;  Location: [facility];  Service: Urology;  Laterality: N/A;   ESOPHAGOGASTRODUODENOSCOPY (EGD) WITH PROPOFOL  N/A 05/12/2017   Procedure: ESOPHAGOGASTRODUODENOSCOPY (EGD) WITH PROPOFOL ;  Surgeon: Juanita Norlander, MD;  Location: [facility];  Service: General;  Laterality: N/A;   GASTRIC ROUX-EN-Y N/A 03/25/2016   Procedure: LAPAROSCOPIC ROUX-EN-Y GASTRIC BYPASS WITH UPPER ENDOSCOPY;  Surgeon: Ayesha Lente, MD;  Location: [facility];  Service: General;  Laterality: N/A;   KNEE SURGERY     LAPAROSCOPIC ASSISTED VAGINAL HYSTERECTOMY  03/04/2011   Procedure: LAPAROSCOPIC ASSISTED VAGINAL HYSTERECTOMY;  Surgeon: Martine Sleek, MD;  Location: [facility];  Service: Gynecology;  Laterality: N/A;   LAPAROTOMY N/A 04/18/2022   Procedure: EXPLORATORY LAPAROTOMY;  Surgeon: Sim Dryer, MD;  Location: [facility];  Service: General;  Laterality: N/A;   LAPAROTOMY N/A 04/24/2022   Procedure: BRING BACK EXPLORATORY  LAPAROTOMY;  Surgeon: Dorena Gander, MD;  Location: [facility] OR;  Service: General;  Laterality: N/A;   TOTAL HIP ARTHROPLASTY Right 08/27/2022   Procedure: TOTAL HIP ARTHROPLASTY;  Surgeon: Murleen Arms, MD;  Location: [facility] OR;  Service: Orthopedics;  Laterality: Right;     Medications:   Current Facility-Administered Medications:    arformoterol  (BROVANA ) nebulizer solution 15 mcg, 15 mcg, Nebulization, BID, Autry, Lauren E, PA-C, 15 mcg at 11/11/23 2952   ARIPiprazole  (ABILIFY ) tablet 10 mg, 10 mg, Oral, Daily, Autry, Lauren E, PA-C, 10 mg at 11/11/23 0908   benzonatate  (TESSALON ) capsule 200 mg, 200 mg, Oral, TID PRN, Howerter, Justin B, DO, 200 mg at 11/10/23 1111   budesonide  (PULMICORT ) nebulizer solution 0.25 mg, 0.25 mg, Nebulization, BID, Autry, Lauren E, PA-C, 0.25 mg at 11/11/23 8413   butalbital -acetaminophen -caffeine  (FIORICET) 50-325-40 MG per tablet 1 tablet, 1 tablet, Oral, Q6H PRN, Elgergawy, Dawood S, MD, 1 tablet at 11/10/23 1111   divalproex  (DEPAKOTE  SPRINKLE) capsule 250 mg, 250 mg, Oral, Q12H, Starkes-Perry, Allana Ishikawa, FNP   enoxaparin  (LOVENOX ) injection 80 mg, 80 mg, Subcutaneous, Q24H, Pham, Minh Q, RPH-CPP, 80 mg at 11/10/23 1639   ferrous sulfate  tablet 325 mg, 325 mg, Oral, Q breakfast, Albustami, Omar M, MD, 325 mg at 11/11/23 2440   folic acid  (FOLVITE ) tablet 1 mg, 1 mg, Oral, Daily, Janas Meadows, Lauren E, PA-C, 1 mg at 11/11/23 1027   haloperidol  lactate (HALDOL ) injection 4 mg, 4 mg, Intravenous, Q6H PRN, Ghimire, Estil Heman, MD   insulin  aspart (novoLOG ) injection 0-15 Units, 0-15 Units, Subcutaneous, TID WC, Ree Candy, MD, 2 Units at 11/11/23 1223   ipratropium-albuterol  (DUONEB) 0.5-2.5 (3) MG/3ML nebulizer solution 3 mL, 3 mL, Nebulization, Q4H PRN, Janas Meadows, Lauren E, PA-C, 3 mL at 11/11/23 0227   levETIRAcetam  (KEPPRA ) undiluted injection 500 mg, 500 mg, Intravenous, Q12H, Elgergawy, Dawood S, MD, 500 mg at 11/11/23 0857   LORazepam  (ATIVAN )  tablet 1-4 mg,  1-4 mg, Oral, Q1H PRN, 3 mg at 11/11/23 1022 **OR** LORazepam  (ATIVAN ) injection 1-4 mg, 1-4 mg, Intravenous, Q1H PRN, Howerter, Justin B, DO, 2 mg at 11/11/23 0817   multivitamin with minerals tablet 1 tablet, 1 tablet, Oral, Daily, Autry, Lauren E, PA-C, 1 tablet at 11/11/23 1610   nicotine  (NICODERM CQ  - dosed in mg/24 hours) patch 21 mg, 21 mg, Transdermal, Daily, Autry, Lauren E, PA-C, 21 mg at 11/11/23 0905   ondansetron  (ZOFRAN ) injection 4 mg, 4 mg, Intravenous, Q6H PRN, Reesa Cannon N, DO, 4 mg at 11/08/23 0605   pantoprazole  (PROTONIX ) EC tablet 40 mg, 40 mg, Oral, Daily, Autry, Lauren E, PA-C, 40 mg at 11/11/23 0856   polyethylene glycol (MIRALAX  / GLYCOLAX ) packet 17 g, 17 g, Oral, BID, Ree Candy, MD, 17 g at 11/11/23 9604   revefenacin  (YUPELRI ) nebulizer solution 175 mcg, 175 mcg, Nebulization, Daily, Autry, Lauren E, PA-C, 175 mcg at 11/11/23 0857   thiamine  (VITAMIN B1) 200 mg in sodium chloride  0.9 % 50 mL IVPB, 200 mg, Intravenous, Q24H, Stopped at 11/06/23 1300 **OR** thiamine  (VITAMIN B1) tablet 100 mg, 100 mg, Oral, Q24H, Autry, Lauren E, PA-C, 100 mg at 11/11/23 5409   zonisamide  (ZONEGRAN ) capsule 200 mg, 200 mg, Oral, Daily, Janas Meadows, Lauren E, PA-C, 200 mg at 11/11/23 0856  Allergies: Allergies  Allergen Reactions   Iron  Dextran Shortness Of Breath and Anxiety   Aspirin  Nausea And Vomiting and Other (See Comments)    Ok to take tylenol  or ibuprofen      Rella Cardinal, FNP-PMHNP-BC

## 2023-11-11 NOTE — Progress Notes (Signed)
 Pt has had several episodes of agitation, anxiety and restlessness. Her last episodes resulting in security being called as patient was not able to be redirected. Pt was given haldol  4mg  around 1730. Pts heart rate was up to 120s and she stated she could not breath gave prn neb tx and placed 02 for comfort. Pt also on CIWA scale tremors agitation and anxiety warranted ativan  adnministration. Score was

## 2023-11-11 NOTE — Progress Notes (Addendum)
 PROGRESS NOTE        PATIENT DETAILS Name: [name] Age: 58 y.o. Sex: female Date of Birth: 07/20/1965 Admit Date: 11/04/2023 Admitting Physician Margaretann Sharper, MD [id number], No Pcp Per  Brief Summary: Patient is a 58 y.o.  female with history of EtOH/cocaine use, COPD on home O2, history of PE not on anticoagulation, depression/anxiety/schizophrenia, medication noncompliance-presented with anemia-admitted to the ICU due to alcohol  withdrawal symptoms.  Significant events: 5/14>> admit to ICU 5/17>> transferred to [facility]  Significant studies: 5/14>> CXR: No obvious PNA 5/14>> LUE Doppler: Left ulnar DVT. 5/21>> CXR: Right basilar atelectasis.  Significant microbiology data: None  Procedures: None  Consults: PCCM  Subjective: Choking episode with suspected aspiration-agitated overnight.  Objective: Vitals: Blood pressure 124/61, pulse 91, temperature 98.2 F (36.8 C), temperature source Oral, resp. rate 20, height 5\' 2"  (1.575 m), weight 57.1 kg, last menstrual period 01/08/2011, SpO2 98%.   Exam: Gen Exam:Alert awake-not in any distress HEENT:atraumatic, normocephalic Chest: B/L clear to auscultation anteriorly CVS:S1S2 regular Abdomen:soft non tender, non distended Extremities:no edema Neurology: Non focal Skin: no rash  Pertinent Labs/Radiology:    Latest Ref Rng & Units 11/11/2023    3:37 AM 11/10/2023    9:31 AM 11/09/2023    9:27 AM  CBC  WBC 4.0 - 10.5 K/uL 4.8  11.0  6.5   Hemoglobin 12.0 - 15.0 g/dL 8.1  8.9  8.1   Hematocrit 36.0 - 46.0 % 28.4  30.9  27.7   Platelets 150 - 400 K/uL 271  279  PLATELET CLUMPS NOTED ON SMEAR, UNABLE TO ESTIMATE     Lab Results  Component Value Date   NA 137 11/11/2023   K 3.8 11/11/2023   CL 105 11/11/2023   CO2 24 11/11/2023      Assessment/Plan: EtOH withdrawal Continues to have intermittent agitation-not sure if this is alcohol  withdrawal or just behavioral/related to  his underlying psych issues.  She should be almost out of window for any further alcohol  withdrawal symptoms at this point.  She was agitated last night but relatively awake/alert and oriented during my evaluation this morning. Completed phenobarb taper on 5/20.  Acute toxic metabolic encephalopathy Felt to be multifactorial-EtOH withdrawal/possible postictal state from seizures. Mentation fluctuating-continues to have intermittent episodes of agitation/impulsiveness Recent vitamin B1 level on 5/9 stable-doubt Korsakoff psychosis/Wernicke's at this point.  Seizure disorder No seizures overnight Continue Keppra /zonisamide   Choking-aspiration episode on 5/21 Some cough but no obvious pneumonia on CXR Appreciate SLP eval-started on regular diet Monitor off antibiotics given clinical stability.  COPD-chronic hypoxic respiratory failure on 4-5 L of oxygen  at home Not in exacerbation Continue bronchodilators  DM-2 (A1c 6.5 on 5/15) Stable on SSI Resume metformin  on discharge  Recent Labs    11/10/23 1555 11/10/23 2118 11/11/23 1159  GLUCAP 155* 144* 144*     Left upper extremity DVT Continue Lovenox  Very difficult situation-noncompliant-known history of drug/cocaine use-not ideal long-term anticoagulation candidate.  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   Code status:   Code  Status: Full Code   DVT Prophylaxis: SCDs Start: 11/04/23 1310   Family Communication: None at bedside   Disposition Plan: Status is:  Inpatient Remains inpatient appropriate because: Severity of illness   Planned Discharge Destination:Home   Diet: Diet Order             Diet regular Room service appropriate? Yes; Fluid consistency: Thin  Diet effective now                     Antimicrobial agents: Anti-infectives (From admission, onward)    None        MEDICATIONS: Scheduled Meds:  arformoterol   15 mcg Nebulization BID   ARIPiprazole   10 mg Oral Daily   budesonide  (PULMICORT ) nebulizer solution  0.25 mg Nebulization BID   enoxaparin  (LOVENOX ) injection  80 mg Subcutaneous Q24H   ferrous sulfate   325 mg Oral Q breakfast   folic acid   1 mg Oral Daily   insulin  aspart  0-15 Units Subcutaneous TID WC   levETIRAcetam   500 mg Intravenous Q12H   multivitamin with minerals  1 tablet Oral Daily   nicotine   21 mg Transdermal Daily   pantoprazole   40 mg Oral Daily   polyethylene glycol  17 g Oral BID   revefenacin   175 mcg Nebulization Daily   thiamine   100 mg Oral Q24H   zonisamide   200 mg Oral Daily   Continuous Infusions:  thiamine  (VITAMIN B1) injection Stopped (11/06/23 1300)   PRN Meds:.benzonatate , butalbital -acetaminophen -caffeine , ipratropium-albuterol , LORazepam  **OR** LORazepam , ondansetron  (ZOFRAN ) IV   I have personally reviewed following labs and imaging studies  LABORATORY DATA: CBC: Recent Labs  Lab 11/07/23 0417 11/08/23 1430 11/09/23 0927 11/10/23 0931 11/11/23 0337  WBC 6.9 6.6 6.5 11.0* 4.8  HGB 8.4* 8.3* 8.1* 8.9* 8.1*  HCT 29.7* 29.5* 27.7* 30.9* 28.4*  MCV 74.6* 76.4* 73.9* 75.2* 74.9*  PLT 229 278 PLATELET CLUMPS NOTED ON SMEAR, UNABLE TO ESTIMATE 279 271    Basic Metabolic Panel: Recent Labs  Lab 11/05/23 0500 11/05/23 0559 11/06/23 0415 11/08/23 1430 11/09/23 0337 11/10/23 0931 11/11/23 0337  NA  --   --  134* 134* 135 133* 137  K  --   --  4.4 3.9 4.0 4.4 3.8  CL  --   --  104 104 103 101 105  CO2  --   --  24 19* 20* 22 24  GLUCOSE  --   --  103*  215* 115* 161* 126*  BUN  --   --  11 5* 7 6 8   CREATININE  --   --  0.76 0.73 0.51 0.56 0.48  CALCIUM   --   --  8.0* 8.1* 8.0* 8.9 8.5*  MG  --    < > 2.1 1.8 2.0 2.0 2.1  PHOS 3.5  --   --  4.7* 6.1* 4.0 3.8   < > = values in this interval not displayed.    GFR: Estimated Creatinine Clearance: 59.9 mL/min (by C-G formula based on SCr of 0.48 mg/dL).  Liver Function Tests: Recent Labs  Lab 11/08/23 1430  AST 29  ALT 18  ALKPHOS 61  BILITOT 0.4  PROT 6.1*  ALBUMIN  2.7*   No results for input(s): "LIPASE", "AMYLASE" in the last 168 hours. No results for input(s): "AMMONIA" in the last 168 hours.  Coagulation Profile: No results for input(s): "INR", "PROTIME" in the last 168 hours.  Cardiac Enzymes: No results for input(s): "CKTOTAL", "CKMB", "CKMBINDEX", "TROPONINI" in the last 168  hours.  BNP (last 3 results) No results for input(s): "PROBNP" in the last 8760 hours.  Lipid Profile: No results for input(s): "CHOL", "HDL", "LDLCALC", "TRIG", "CHOLHDL", "LDLDIRECT" in the last 72 hours.  Thyroid  Function Tests: No results for input(s): "TSH", "T4TOTAL", "FREET4", "T3FREE", "THYROIDAB" in the last 72 hours.  Anemia Panel: No results for input(s): "VITAMINB12", "FOLATE", "FERRITIN", "TIBC", "IRON ", "RETICCTPCT" in the last 72 hours.  Urine analysis:    Component Value Date/Time   COLORURINE STRAW (A) 10/29/2023 2338   APPEARANCEUR CLEAR 10/29/2023 2338   LABSPEC 1.003 (L) 10/29/2023 2338   PHURINE 5.0 10/29/2023 2338   GLUCOSEU >=500 (A) 10/29/2023 2338   HGBUR NEGATIVE 10/29/2023 2338   BILIRUBINUR NEGATIVE 10/29/2023 2338   KETONESUR NEGATIVE 10/29/2023 2338   PROTEINUR NEGATIVE 10/29/2023 2338   UROBILINOGEN 0.2 09/04/2013 0426   NITRITE NEGATIVE 10/29/2023 2338   LEUKOCYTESUR NEGATIVE 10/29/2023 2338    Sepsis Labs: Lactic Acid, Venous    Component Value Date/Time   LATICACIDVEN 3.1 (HH) 10/30/2023 0650    MICROBIOLOGY: Recent Results (from the past  240 hours)  MRSA Next Gen by PCR, Nasal     Status: Abnormal   Collection Time: 11/04/23  4:19 PM   Specimen: Nasal Mucosa; Nasal Swab  Result Value Ref Range Status   MRSA by PCR Next Gen DETECTED (A) NOT DETECTED Final    Comment: RESULT CALLED TO, READ BACK BY AND VERIFIED WITH: RN [facility] MEJIA ON 11/04/23 @ 1947 BY DRT (NOTE) The GeneXpert MRSA Assay (FDA approved for NASAL specimens only), is one component of a comprehensive MRSA colonization surveillance program. It is not intended to diagnose MRSA infection nor to guide or monitor treatment for MRSA infections. Test performance is not FDA approved in patients less than 31 years old. Performed at [facility], [address], [place], [place] [address]     RADIOLOGY STUDIES/RESULTS: DG Chest Port 1 View Result Date: 11/11/2023 CLINICAL DATA:  Hypoxia, wheezing EXAM: PORTABLE CHEST 1 VIEW COMPARISON:  11/10/2023 FINDINGS: Stable right basilar atelectasis. Lungs are otherwise clear. No pneumothorax or pleural effusion. Cardiac size within normal limits. Pulmonary vascularity is normal. No acute bone abnormality. IMPRESSION: 1. Right basilar atelectasis. Electronically Signed   By: Worthy Heads M.D.   On: 11/11/2023 02:51   DG Chest Port 1 View Result Date: 11/10/2023 CLINICAL DATA:  10031 Cough 10031 EXAM: PORTABLE CHEST 1 VIEW COMPARISON:  Chest x-ray 11/04/2023, CT chest 06/28/2023 FINDINGS: Patient is rotated. The heart and mediastinal contours are unchanged. Atherosclerotic plaque. Low lung volumes. No focal consolidation. No pulmonary edema. No pleural effusion. No pneumothorax. No acute osseous abnormality. IMPRESSION: Low lung volumes with no active disease. Patient is rotated. Recommend repeat PA and lateral view of the chest for further evaluation. Electronically Signed   By: Morgane  Naveau M.D.   On: 11/10/2023 00:29     LOS: 7 days   Kimberly Penna, MD  [facility]    To contact the attending provider  between 7A-7P or the covering provider during after hours 7P-7A, please log into the web site [email or web address] and access using universal [place] password for that web site. If you do not have the password, please call the hospital operator.  11/11/2023, 12:18 PM

## 2023-11-11 NOTE — Plan of Care (Signed)

## 2023-11-12 DIAGNOSIS — I82622 Acute embolism and thrombosis of deep veins of left upper extremity: Secondary | ICD-10-CM | POA: Diagnosis not present

## 2023-11-12 DIAGNOSIS — F10930 Alcohol use, unspecified with withdrawal, uncomplicated: Secondary | ICD-10-CM | POA: Diagnosis not present

## 2023-11-12 DIAGNOSIS — F10931 Alcohol use, unspecified with withdrawal delirium: Secondary | ICD-10-CM | POA: Diagnosis not present

## 2023-11-12 DIAGNOSIS — D649 Anemia, unspecified: Secondary | ICD-10-CM | POA: Diagnosis not present

## 2023-11-12 LAB — HEPARIN ANTI-XA: Heparin LMW: 1.23 [IU]/mL

## 2023-11-12 LAB — URINALYSIS, ROUTINE W REFLEX MICROSCOPIC
Bilirubin Urine: NEGATIVE
Glucose, UA: >=500 mg/dL — AB
Hgb urine dipstick: NEGATIVE
Ketones, ur: NEGATIVE mg/dL
Leukocytes,Ua: NEGATIVE
Nitrite: NEGATIVE
Protein, ur: NEGATIVE mg/dL
Specific Gravity, Urine: 1.006 (ref 1.005–1.030)
pH: 5 (ref 5.0–8.0)

## 2023-11-12 LAB — BASIC METABOLIC PANEL WITH GFR
Anion gap: 11 (ref 5–15)
BUN: 10 mg/dL (ref 6–20)
CO2: 24 mmol/L (ref 22–32)
Calcium: 8.7 mg/dL — ABNORMAL LOW (ref 8.9–10.3)
Chloride: 102 mmol/L (ref 98–111)
Creatinine, Ser: 0.44 mg/dL (ref 0.44–1.00)
GFR, Estimated: >60 mL/min (ref >60)
Glucose, Bld: 100 mg/dL — ABNORMAL HIGH (ref 70–99)
Potassium: 4.3 mmol/L (ref 3.5–5.1)
Sodium: 137 mmol/L (ref 135–145)

## 2023-11-12 LAB — GLUCOSE, CAPILLARY
Glucose-Capillary: 112 mg/dL — ABNORMAL HIGH (ref 70–99)
Glucose-Capillary: 92 mg/dL (ref 70–99)
Glucose-Capillary: 141 mg/dL — ABNORMAL HIGH (ref 70–99)
Glucose-Capillary: 86 mg/dL (ref 70–99)
Glucose-Capillary: 184 mg/dL — ABNORMAL HIGH (ref 70–99)

## 2023-11-12 LAB — CBC
HCT: 27.7 % — ABNORMAL LOW (ref 36.0–46.0)
Hemoglobin: 8.1 g/dL — ABNORMAL LOW (ref 12.0–15.0)
MCH: 21.8 pg — ABNORMAL LOW (ref 26.0–34.0)
MCHC: 29.2 g/dL — ABNORMAL LOW (ref 30.0–36.0)
MCV: 74.7 fL — ABNORMAL LOW (ref 80.0–100.0)
Platelets: 253 10*3/uL (ref 150–400)
RBC: 3.71 MIL/uL — ABNORMAL LOW (ref 3.87–5.11)
RDW: 21.7 % — ABNORMAL HIGH (ref 11.5–15.5)
WBC: 5 10*3/uL (ref 4.0–10.5)
nRBC: 0 % (ref 0.0–0.2)

## 2023-11-12 LAB — PHOSPHORUS: Phosphorus: 5.7 mg/dL — ABNORMAL HIGH (ref 2.5–4.6)

## 2023-11-12 LAB — MAGNESIUM: Magnesium: 2 mg/dL (ref 1.7–2.4)

## 2023-11-12 NOTE — Plan of Care (Signed)
 Patient still requires restraints and antianxiety medication due to alcohol  withdrawal.

## 2023-11-12 NOTE — Progress Notes (Signed)
 PROGRESS NOTE        PATIENT DETAILS Name: [name] Age: 58 y.o. Sex: female Date of Birth: Jan 18, 1966 Admit Date: 11/04/2023 Admitting Physician Margaretann Sharper, MD [id number], No Pcp Per  Brief Summary: Patient is a 58 y.o.  female with history of EtOH/cocaine use, COPD on home O2, history of PE not on anticoagulation, depression/anxiety/schizophrenia, medication noncompliance-presented with anemia-admitted to the ICU due to alcohol  withdrawal symptoms.  Significant events: 5/14>> admit to ICU 5/17>> transferred to [facility]  Significant studies: 5/14>> CXR: No obvious PNA 5/14>> LUE Doppler: Left ulnar DVT. 5/21>> CXR: Right basilar atelectasis.  Significant microbiology data: None  Procedures: None  Consults: PCCM  Subjective: Awake/alert this morning-was again agitated and confused last night briefly requiring restraints.  Objective: Vitals: Blood pressure 103/69, pulse 85, temperature (!) 97.5 F (36.4 C), temperature source Axillary, resp. rate 20, height 5\' 2"  (1.575 m), weight 57.6 kg, last menstrual period 01/08/2011, SpO2 98%.   Exam: Awake/alert Chest: Clear to auscultation CVs: S1-S2 regular Abdomen: Soft nontender nondistended Extremities: No edema Neurology: Nonfocal exam  Pertinent Labs/Radiology:    Latest Ref Rng & Units 11/12/2023    4:23 AM 11/11/2023    3:37 AM 11/10/2023    9:31 AM  CBC  WBC 4.0 - 10.5 K/uL 5.0  4.8  11.0   Hemoglobin 12.0 - 15.0 g/dL 8.1  8.1  8.9   Hematocrit 36.0 - 46.0 % 27.7  28.4  30.9   Platelets 150 - 400 K/uL 253  271  279     Lab Results  Component Value Date   NA 137 11/12/2023   K 4.3 11/12/2023   CL 102 11/12/2023   CO2 24 11/12/2023     Assessment/Plan: EtOH withdrawal Overall improved-continues to have intermittent issues with agitation-she should be out of the window for alcohol  withdrawal at this point.  Remains on Ativan  taper per CIWA protocol.  Completed phenobarb  taper on 5/20.    Acute toxic metabolic encephalopathy Felt to be multifactorial-EtOH withdrawal/possible postictal state from seizures. Mentation continues to fluctuate-has intermittent episodes of agitation/impulsiveness. Getting psychiatric input given longstanding history of multiple psych issues in the past Recent vitamin B1 level on 5/9 stable-doubt Korsakoff psychosis/Wernicke's at this point. Remains on Abilify .  Seizure disorder No further seizures. Continue Keppra /zonisamide   Choking-aspiration episode on 5/21 Some cough but no obvious pneumonia on CXR Appreciate SLP eval-started on regular diet-which he seems to be tolerating. Monitor off antibiotics given clinical stability.  COPD-chronic hypoxic respiratory failure on 4-5 L of oxygen  at home Not in exacerbation Continue bronchodilators  DM-2 (A1c 6.5 on 5/15) Stable on SSI Resume metformin  on discharge  Recent Labs    11/11/23 1717 11/12/23 0837 11/12/23 0905  GLUCAP 269* 112* 92     Left upper extremity DVT Continue Lovenox  Very difficult situation-noncompliant-known history of drug/cocaine use-not ideal long-term anticoagulation candidate.  Discussed with daughter Basil Boston 5/22-she understands difficult situation-we both agree that we will continue anticoagulation while she is inpatient-and on discharge not place her on anticoagulation.  PAF Telemetry monitoring Not appropriate long-term candidate given drug use/noncompliance  History of schizoaffective disorder/bipolar disorder/depression Remains on Abilify  Continues to have intermittent episodes of agitation/impulsiveness Psych consult ordered on 5/21.  Pending official consult.  Tobacco abuse Continue counseling  History of cocaine use Multiple UDS in the past positive for cocaine-claims she has not  done cocaine since her husband passed away a few months back.   Continue counseling  + RPR Appreciate ID input-consistent with old infection that  has been treated-no concern for neurosyphilis.   Code status:   Code Status: Full Code   DVT Prophylaxis: SCDs Start: 11/04/23 1310   Family Communication: Daughter-[name] updated 5/22   Disposition Plan: Status is: Inpatient Remains inpatient appropriate because: Severity of illness   Planned Discharge Destination:Home   Diet: Diet Order             Diet regular Room service appropriate? Yes; Fluid consistency: Thin  Diet effective now                     Antimicrobial agents: Anti-infectives (From admission, onward)    None        MEDICATIONS: Scheduled Meds:  arformoterol   15 mcg Nebulization BID   ARIPiprazole   10 mg Oral Daily   budesonide  (PULMICORT ) nebulizer solution  0.25 mg Nebulization BID   divalproex   250 mg Oral Q12H   enoxaparin  (LOVENOX ) injection  80 mg Subcutaneous Q24H   ferrous sulfate   325 mg Oral Q breakfast   folic acid   1 mg Oral Daily   insulin  aspart  0-15 Units Subcutaneous TID WC   levETIRAcetam   500 mg Intravenous Q12H   multivitamin with minerals  1 tablet Oral Daily   nicotine   21 mg Transdermal Daily   pantoprazole   40 mg Oral Daily   polyethylene glycol  17 g Oral BID   revefenacin   175 mcg Nebulization Daily   thiamine   100 mg Oral Q24H   zonisamide   200 mg Oral Daily   Continuous Infusions:  thiamine  (VITAMIN B1) injection Stopped (11/06/23 1300)   PRN Meds:.benzonatate , butalbital -acetaminophen -caffeine , haloperidol  lactate, ipratropium-albuterol , LORazepam  **OR** LORazepam , ondansetron  (ZOFRAN ) IV, oxyCODONE    I have personally reviewed following labs and imaging studies  LABORATORY DATA: CBC: Recent Labs  Lab 11/08/23 1430 11/09/23 0927 11/10/23 0931 11/11/23 0337 11/12/23 0423  WBC 6.6 6.5 11.0* 4.8 5.0  HGB 8.3* 8.1* 8.9* 8.1* 8.1*  HCT 29.5* 27.7* 30.9* 28.4* 27.7*  MCV 76.4* 73.9* 75.2* 74.9* 74.7*  PLT 278 PLATELET CLUMPS NOTED ON SMEAR, UNABLE TO ESTIMATE 279 271 253    Basic  Metabolic Panel: Recent Labs  Lab 11/08/23 1430 11/09/23 0337 11/10/23 0931 11/11/23 0337 11/12/23 0423  NA 134* 135 133* 137 137  K 3.9 4.0 4.4 3.8 4.3  CL 104 103 101 105 102  CO2 19* 20* 22 24 24   GLUCOSE 215* 115* 161* 126* 100*  BUN 5* 7 6 8 10   CREATININE 0.73 0.51 0.56 0.48 0.44  CALCIUM  8.1* 8.0* 8.9 8.5* 8.7*  MG 1.8 2.0 2.0 2.1 2.0  PHOS 4.7* 6.1* 4.0 3.8 5.7*    GFR: Estimated Creatinine Clearance: 59.9 mL/min (by C-G formula based on SCr of 0.44 mg/dL).  Liver Function Tests: Recent Labs  Lab 11/08/23 1430 11/11/23 0337  AST 29 32  ALT 18 19  ALKPHOS 61 51  BILITOT 0.4 <0.2  PROT 6.1* 6.4*  ALBUMIN  2.7* 2.9*   No results for input(s): "LIPASE", "AMYLASE" in the last 168 hours. No results for input(s): "AMMONIA" in the last 168 hours.  Coagulation Profile: No results for input(s): "INR", "PROTIME" in the last 168 hours.  Cardiac Enzymes: No results for input(s): "CKTOTAL", "CKMB", "CKMBINDEX", "TROPONINI" in the last 168 hours.  BNP (last 3 results) No results for input(s): "PROBNP" in the last 8760 hours.  Lipid Profile:  No results for input(s): "CHOL", "HDL", "LDLCALC", "TRIG", "CHOLHDL", "LDLDIRECT" in the last 72 hours.  Thyroid  Function Tests: No results for input(s): "TSH", "T4TOTAL", "FREET4", "T3FREE", "THYROIDAB" in the last 72 hours.  Anemia Panel: No results for input(s): "VITAMINB12", "FOLATE", "FERRITIN", "TIBC", "IRON ", "RETICCTPCT" in the last 72 hours.  Urine analysis:    Component Value Date/Time   COLORURINE STRAW (A) 10/29/2023 2338   APPEARANCEUR CLEAR 10/29/2023 2338   LABSPEC 1.003 (L) 10/29/2023 2338   PHURINE 5.0 10/29/2023 2338   GLUCOSEU >=500 (A) 10/29/2023 2338   HGBUR NEGATIVE 10/29/2023 2338   BILIRUBINUR NEGATIVE 10/29/2023 2338   KETONESUR NEGATIVE 10/29/2023 2338   PROTEINUR NEGATIVE 10/29/2023 2338   UROBILINOGEN 0.2 09/04/2013 0426   NITRITE NEGATIVE 10/29/2023 2338   LEUKOCYTESUR NEGATIVE 10/29/2023  2338    Sepsis Labs: Lactic Acid, Venous    Component Value Date/Time   LATICACIDVEN 3.1 (HH) 10/30/2023 0650    MICROBIOLOGY: Recent Results (from the past 240 hours)  MRSA Next Gen by PCR, Nasal     Status: Abnormal   Collection Time: 11/04/23  4:19 PM   Specimen: Nasal Mucosa; Nasal Swab  Result Value Ref Range Status   MRSA by PCR Next Gen DETECTED (A) NOT DETECTED Final    Comment: RESULT CALLED TO, READ BACK BY AND VERIFIED WITH: RN [facility] MEJIA ON 11/04/23 @ 1947 BY DRT (NOTE) The GeneXpert MRSA Assay (FDA approved for NASAL specimens only), is one component of a comprehensive MRSA colonization surveillance program. It is not intended to diagnose MRSA infection nor to guide or monitor treatment for MRSA infections. Test performance is not FDA approved in patients less than 43 years old. Performed at [facility], [address], [place], [place] [address]     RADIOLOGY STUDIES/RESULTS: DG Chest Port 1 View Result Date: 11/11/2023 CLINICAL DATA:  Hypoxia, wheezing EXAM: PORTABLE CHEST 1 VIEW COMPARISON:  11/10/2023 FINDINGS: Stable right basilar atelectasis. Lungs are otherwise clear. No pneumothorax or pleural effusion. Cardiac size within normal limits. Pulmonary vascularity is normal. No acute bone abnormality. IMPRESSION: 1. Right basilar atelectasis. Electronically Signed   By: Worthy Heads M.D.   On: 11/11/2023 02:51     LOS: 8 days   Kimberly Penna, MD  [facility]    To contact the attending provider between 7A-7P or the covering provider during after hours 7P-7A, please log into the web site [email or web address] and access using universal [place] password for that web site. If you do not have the password, please call the hospital operator.  11/12/2023, 11:38 AM

## 2023-11-12 NOTE — Progress Notes (Addendum)
 PHARMACY - ANTICOAGULATION CONSULT NOTE  Pharmacy Consult for lovenox  Indication: DVT  Allergies  Allergen Reactions   Iron  Dextran Shortness Of Breath and Anxiety   Aspirin  Nausea And Vomiting and Other (See Comments)    Ok to take tylenol  or ibuprofen      Patient Measurements: Height: 5\' 2"  (157.5 cm) Weight: 57.6 kg (126 lb 15.8 oz) IBW/kg (Calculated) : 50.1 HEPARIN  DW (KG): 49  Vital Signs: Temp: 98.1 F (36.7 C) (05/22 1206) Temp Source: Oral (05/22 1206) BP: 120/79 (05/22 2000) Pulse Rate: 103 (05/22 1700)  Labs: Recent Labs    11/10/23 0931 11/10/23 1723 11/10/23 1940 11/11/23 0337 11/12/23 0423 11/12/23 1921  HGB 8.9*  --   --  8.1* 8.1*  --   HCT 30.9*  --   --  28.4* 27.7*  --   PLT 279  --   --  271 253  --   HEPRLOWMOCWT  --   --   --   --   --  1.23  CREATININE 0.56  --   --  0.48 0.44  --   TROPONINIHS  --  3 4  --   --   --     Estimated Creatinine Clearance: 59.9 mL/min (by C-G formula based on SCr of 0.44 mg/dL).   Assessment: 55 YOF presenting with anemia, also swelling in left upper extremity, doppler positive for DVT, workup for GI bleed negative and concern for iron  deficiency anemia, discussed with MD and will omit bolus and start conservative heparin  gtt. Pertinent PMH of PAF not anticoagulated given fall risk due to etoh abuse.   Pt transitioned to Lovenox  due to refusal of lab draws. Anti-Xa level 1.23 (therapeutic for goal of 1-2 units/ml when DVT,PE on LMWH 1.5 mg/kg q24) - level drawn only about 2.5 hrs post dose so actual level may be slightly lower   Goal of Therapy:  4 hr anti-Xa level 1-2 units/ml when on 1.5mg /kg q24h. Monitor platelets by anticoagulation protocol: Yes   Plan:  Continue Lovenox  80mg  SQ qday F/u long term [facility] plan Consider another level in 3-4 days if continues on Lovenox   Enrigue Harvard, PharmD, BCPS Please see amion for complete clinical pharmacist phone list 11/12/2023 8:26 PM

## 2023-11-12 NOTE — Progress Notes (Signed)
   11/12/23 1006  Mobility  Activity Ambulated with assistance in hallway  Level of Assistance Minimal assist, patient does 75% or more  Assistive Device Other (Comment) (HHA and Handrail)  Distance Ambulated (ft) 150 ft  Activity Response Tolerated fair  Mobility Referral Yes  Mobility visit 1 Mobility  Mobility Specialist Start Time (ACUTE ONLY) 1006  Mobility Specialist Stop Time (ACUTE ONLY) 1020  Mobility Specialist Time Calculation (min) (ACUTE ONLY) 14 min   Mobility Specialist: Progress Note  Post-Mobility:    HR  106  Pt agreeable to mobility session - received in bed. Pt was asymptomatic throughout session with no complaints. Returned to chair with all needs met - call bell within reach. Chair alarm on. RN present in room.   Isla Mari, BS Mobility Specialist Please contact via SecureChat or  Rehab office at [phone].

## 2023-11-13 DIAGNOSIS — D649 Anemia, unspecified: Secondary | ICD-10-CM | POA: Diagnosis not present

## 2023-11-13 DIAGNOSIS — F10930 Alcohol use, unspecified with withdrawal, uncomplicated: Secondary | ICD-10-CM | POA: Diagnosis not present

## 2023-11-13 DIAGNOSIS — I82622 Acute embolism and thrombosis of deep veins of left upper extremity: Secondary | ICD-10-CM | POA: Diagnosis not present

## 2023-11-13 DIAGNOSIS — F10931 Alcohol use, unspecified with withdrawal delirium: Secondary | ICD-10-CM | POA: Diagnosis not present

## 2023-11-13 LAB — CBC
HCT: 26.6 % — ABNORMAL LOW (ref 36.0–46.0)
Hemoglobin: 7.8 g/dL — ABNORMAL LOW (ref 12.0–15.0)
MCH: 21.7 pg — ABNORMAL LOW (ref 26.0–34.0)
MCHC: 29.3 g/dL — ABNORMAL LOW (ref 30.0–36.0)
MCV: 74.1 fL — ABNORMAL LOW (ref 80.0–100.0)
Platelets: 260 10*3/uL (ref 150–400)
RBC: 3.59 MIL/uL — ABNORMAL LOW (ref 3.87–5.11)
RDW: 22 % — ABNORMAL HIGH (ref 11.5–15.5)
WBC: 4.5 10*3/uL (ref 4.0–10.5)
nRBC: 0 % (ref 0.0–0.2)

## 2023-11-13 LAB — GLUCOSE, CAPILLARY
Glucose-Capillary: 171 mg/dL — ABNORMAL HIGH (ref 70–99)
Glucose-Capillary: 199 mg/dL — ABNORMAL HIGH (ref 70–99)
Glucose-Capillary: 92 mg/dL (ref 70–99)

## 2023-11-13 LAB — MAGNESIUM: Magnesium: 1.8 mg/dL (ref 1.7–2.4)

## 2023-11-13 NOTE — Consult Note (Addendum)
 [facility] Psychiatric Consult Followup  Patient Name: .[name]  MRN: [id number]  DOB: 10-24-1965  Consult Order details:  Orders (From admission, onward)     Start     Ordered   06/12/23 0826  CONSULT TO CALL ACT TEAM       Ordering Provider: Jerilynn Montenegro, MD  Provider:  (Not yet assigned)  Question Answer Comment  Place call to: ACT   Reason for Consult Admit      06/12/23 0825   06/11/23 1913  CONSULT TO CALL ACT TEAM       Ordering Provider: Carin Charleston, MD  Provider:  (Not yet assigned)  Question:  Reason for Consult?  Answer:  IVC, agitation   06/11/23 1912             Mode of Visit: In person    Psychiatry Consult Evaluation  Service Date: Nov 13, 2023 LOS:  LOS: 9 days  Chief Complaint agitation  Primary Psychiatric Diagnoses  Polysubstance abuse 2.   MDD 3.   Anxiety  Assessment  [name] is a 58 y.o. female admitted: Presented to the [facility] 11/04/2023  7:38 AM for agitation due to alcohol  intoxication. She carries the psychiatric diagnoses of MDD and has a past medical history of  diabetes and seizure disorder.    Patient is AA female, 58 years old who came in to the ER intoxicated.  She has required prn restraints and agitation medication throughout this admission. She continues to have some confusion and paranoia although she is well outside the window for alcohol  detox. Today patient is much more calm and cooperative.While she is within the time window to have  alcohol  hallucinosis in the setting of alcohol  withdrawal, I believe that this is less likely given her clinical picture with stable vital signs and lack of withdrawal symptoms this morning. Her clinical picture again indicates that she is being adequately treated with appropriate medication regimen.  She denies any suicidal ideations at this time, and or homicidal ideations. After thorough evaluation and review of information currently present on examination, there are insufficient  findings to indicate patient meets criteria for IVC or requires an inpatient level of care. Patient remains linear, irrelevant, and organized; denies SI/HI and is not currently significantly impaired, psychotic, or manic on exam. Detailed risk assessment is complete based on clinical exam and individual risk factors and acute suicide risk is low and acute violence risk is low.   11/13/2023 I provided motivational interviewing regarding this patient's alcohol  use and patient still in the precontemplative stage of stopping alcohol , provided resources for substance use in her discharge paperwork. States she drinks to cope with the recent loss of her husband.  She continues to not be an imminent danger to herself or others.  Diagnoses:  Active Hospital problems: Principal Problem:   Alcohol  withdrawal (HCC) Active Problems:   History of syphilis    Plan   ## Psychiatric Medication Recommendations:  -WIll restart Depakote -LFTs on 11/11/2023 WNL -Continue High dose IV thiamine .  -phenobarb taper completed  ## Medical Decision Making Capacity:  Patient's legal guardian is her daughter [name]  ## Further Work-up: NA   ## Disposition:-- No barriers to discharge at this time. May discharge with outpatient resources to [facility] once medically stable.    ## Safety and Observation Level:  - Based on my clinical evaluation, I estimate the patient to be at low risk of self harm in the current setting. - At this time,  we recommend  routine. This decision is based on my review of the chart including patient's history and current presentation, interview of the patient, mental status examination, and consideration of suicide risk including evaluating suicidal ideation, plan, intent, suicidal or self-harm behaviors, risk factors, and protective factors. This judgment is based on our ability to directly address suicide risk, implement suicide prevention strategies, and develop a safety plan while the  patient is in the clinical setting. Please contact our team if there is a concern that risk level has changed.  CSSR Risk Category:C-SSRS RISK CATEGORY: No Risk  Suicide Risk Assessment: Patient has following modifiable risk factors for suicide: untreated depression, recklessness, and medication noncompliance, which we are addressing by encouraging patient to join [employer], Seek outpatient alcohol  treatment.  Patient has following non-modifiable or demographic risk factors for suicide: separation or divorce, history of self harm behavior, and psychiatric hospitalization Patient has the following protective factors against suicide: Supportive family and Supportive friends  Thank you for this consult request. Recommendations have been communicated to the primary team.  We will continue to follow from a distance.   Joice Nares, MD, PGY-2       History of Present Illness  Relevant Aspects of Hospital ED Course:   Patient seen getting her breathing treatment sitting in chair this morning, no acute distress.  She reports feeling good today, reporting good sleep and good appetite.  She states that she lives alone in [place] but has much family that lives around her.  Patient states that her mom lives around her and her mother does not smoke but her brother lives with her mother and she does not get along with him.  She is not able to voice motivating factors as to why she should stop drinking.  She states "my husband never drank or smoked yet he died before me and I drink and smoke".  She denies withdrawal effects from alcohol  use at this time.  Patient denies SI, HI, AVH.  Collateral information:  Na  Review of Systems  Constitutional: Negative.   HENT: Negative.    Eyes: Negative.   Respiratory: Negative.    Cardiovascular: Negative.   Gastrointestinal: Negative.   Genitourinary: Negative.   Musculoskeletal: Negative.   Skin: Negative.   Neurological: Negative.    Endo/Heme/Allergies: Negative.   Psychiatric/Behavioral:  Positive for substance abuse.        Alcoholism     Psychiatric and Social History  Psychiatric History:  Information collected from patient   Prev Dx/Sx: MDD, polysubstance abuse Current Psych Provider: None Home Meds (current): None Previous Med Trials: Prozac , does not take anymore Therapy: Past history  Prior Psych Hospitalization: Yes Prior Self Harm: Yes Prior Violence: Yes  Family Psych History: Patient does not know Family Hx suicide: Patient denies  Social History:  Developmental Hx: Patient appears to be appropriate developmental age but Physically patient appears older than stated age. Educational Hx: High school Occupational Hx: Unemployed Legal Hx: None per patient Living Situation: Lives with brother Spiritual Hx: [employer] Access to weapons/lethal means: None  Substance History Alcohol : Yes Type of alcohol  Beer Last Drink yesterday Number of drinks per day 1 to 2 (24 ounces) 3 times a week History of alcohol  withdrawal seizures patient denies History of DT's patient denies Tobacco: Yes Illicit drugs: Yes cocaine Prescription drug abuse: Patient denies Rehab hx: Patient denies  Exam Findings  Physical Exam:  Vital Signs:  Temp:  [98 F (36.7 C)-98.1 F (36.7 C)] 98 F (  36.7 C) (05/22 1610) Pulse Rate:  [95-111] 95 (05/23 0115) Resp:  [20-29] 20 (05/23 0115) BP: (102-141)/(62-88) 117/72 (05/23 0016) SpO2:  [94 %-98 %] 94 % (05/23 0115) Weight:  [58.3 kg] 58.3 kg (05/23 0416) Blood pressure 117/72, pulse 95, temperature 98 F (36.7 C), temperature source Oral, resp. rate 20, height 5\' 2"  (1.575 m), weight 58.3 kg, last menstrual period 01/08/2011, SpO2 94%. Body mass index is 23.51 kg/m.  Physical Exam Vitals (appears older than stated age.) and nursing note reviewed. Exam conducted with a chaperone present.  HENT:     Nose: Nose normal.  Pulmonary:     Effort: Pulmonary effort is  normal.  Musculoskeletal:        General: Normal range of motion.  Skin:    General: Skin is dry.  Neurological:     Mental Status: She is alert and oriented to person, place, and time.  Psychiatric:        Attention and Perception: Attention normal.        Speech: Speech normal.        Behavior: Behavior is cooperative.        Thought Content: Thought content normal.        Cognition and Memory: Cognition and memory normal.     Mental Status Exam: General Appearance: Casual and Neat  Orientation:  Full (Time, Place, and Person)  Memory:  Immediate;   Good Remote;   Good  Concentration:  Concentration: Good and Attention Span: Good  Recall:  Good  Attention  Good  Eye Contact:  Good  Speech:  Clear and Coherent  Language:  Good  Volume:  Normal  Mood: euthymic  Affect:  Appropriate  Thought Process:  Coherent  Thought Content:  WDL  Suicidal Thoughts:  No  Homicidal Thoughts:  No  Judgement:  Good  Insight:  Good  Psychomotor Activity:  Normal  Akathisia:  No  Fund of Knowledge:  Fair      Assets:  Manufacturing systems engineer Desire for Improvement Housing Social Support  Cognition:  WNL  ADL's:  Intact  AIMS (if indicated):        Other History   These have been pulled in through the EMR, reviewed, and updated if appropriate.  Family History:  The patient's family history includes COPD in an other family member; Diabetes in her mother; Heart attack in her father and sister; Heart disease in her mother; Hypertension in her mother.  Medical History: Past Medical History:  Diagnosis Date   Abdominal pain    Accidental drug overdose April 2013   Anxiety    Atrial fibrillation (HCC) 09/29/11   converted spontaneously   Chronic back pain    Chronic knee pain    Chronic nausea    Chronic pain    COPD (chronic obstructive pulmonary disease) (HCC)    Depression    Diabetes mellitus    states her doctor took her off all DM meds in past month   Diabetic neuropathy  (HCC)    Dyspnea    with exertion    GERD (gastroesophageal reflux disease)    Headache(784.0)    migraines    HTN (hypertension)    not on meds since in a year    Hyperlipidemia    Hypothyroidism    not on meds in a while    Mental disorder    Bipolar and schizophrenic   Nausea and vomiting 01/02/2023   Requires supplemental oxygen     as needed per patient  Schizophrenia (HCC)    Schizophrenia, acute (HCC) 11/13/2017   Tobacco abuse     Surgical History: Past Surgical History:  Procedure Laterality Date   ABDOMINAL HYSTERECTOMY     BLADDER SUSPENSION  03/04/2011   Procedure: [facility] PROCEDURE;  Surgeon: Jackelyn Marvel MacDiarmid;  Location: [facility] ORS;  Service: Urology;  Laterality: N/A;   BOWEL RESECTION N/A 04/18/2022   Procedure: SMALL BOWEL RESECTION;  Surgeon: Sim Dryer, MD;  Location: [facility] OR;  Service: General;  Laterality: N/A;   CYSTOCELE REPAIR  03/04/2011   Procedure: ANTERIOR REPAIR (CYSTOCELE);  Surgeon: Devorah Fonder;  Location: [facility];  Service: Urology;  Laterality: N/A;   CYSTOSCOPY  03/04/2011   Procedure: CYSTOSCOPY;  Surgeon: Jackelyn Marvel MacDiarmid;  Location: [facility];  Service: Urology;  Laterality: N/A;   ESOPHAGOGASTRODUODENOSCOPY (EGD) WITH PROPOFOL  N/A 05/12/2017   Procedure: ESOPHAGOGASTRODUODENOSCOPY (EGD) WITH PROPOFOL ;  Surgeon: Juanita Norlander, MD;  Location: [facility];  Service: General;  Laterality: N/A;   GASTRIC ROUX-EN-Y N/A 03/25/2016   Procedure: LAPAROSCOPIC ROUX-EN-Y GASTRIC BYPASS WITH UPPER ENDOSCOPY;  Surgeon: Ayesha Lente, MD;  Location: [facility];  Service: General;  Laterality: N/A;   KNEE SURGERY     LAPAROSCOPIC ASSISTED VAGINAL HYSTERECTOMY  03/04/2011   Procedure: LAPAROSCOPIC ASSISTED VAGINAL HYSTERECTOMY;  Surgeon: Martine Sleek, MD;  Location: [facility];  Service: Gynecology;  Laterality: N/A;   LAPAROTOMY N/A 04/18/2022   Procedure: EXPLORATORY LAPAROTOMY;  Surgeon: Sim Dryer, MD;  Location: [facility] OR;  Service: General;   Laterality: N/A;   LAPAROTOMY N/A 04/24/2022   Procedure: BRING BACK EXPLORATORY LAPAROTOMY;  Surgeon: Dorena Gander, MD;  Location: [facility] OR;  Service: General;  Laterality: N/A;   TOTAL HIP ARTHROPLASTY Right 08/27/2022   Procedure: TOTAL HIP ARTHROPLASTY;  Surgeon: Murleen Arms, MD;  Location: [facility] OR;  Service: Orthopedics;  Laterality: Right;     Medications:   Current Facility-Administered Medications:    arformoterol  (BROVANA ) nebulizer solution 15 mcg, 15 mcg, Nebulization, BID, Autry, Lauren E, PA-C, 15 mcg at 11/12/23 2014   ARIPiprazole  (ABILIFY ) tablet 10 mg, 10 mg, Oral, Daily, Autry, Lauren E, PA-C, 10 mg at 11/12/23 5956   benzonatate  (TESSALON ) capsule 200 mg, 200 mg, Oral, TID PRN, Howerter, Justin B, DO, 200 mg at 11/12/23 1716   budesonide  (PULMICORT ) nebulizer solution 0.25 mg, 0.25 mg, Nebulization, BID, Autry, Lauren E, PA-C, 0.25 mg at 11/12/23 2014   butalbital -acetaminophen -caffeine  (FIORICET) 50-325-40 MG per tablet 1 tablet, 1 tablet, Oral, Q6H PRN, Elgergawy, Dawood S, MD, 1 tablet at 11/12/23 2014   divalproex  (DEPAKOTE  SPRINKLE) capsule 250 mg, 250 mg, Oral, Q12H, Starkes-Perry, Takia S, FNP, 250 mg at 11/12/23 2352   enoxaparin  (LOVENOX ) injection 80 mg, 80 mg, Subcutaneous, Q24H, Pham, Minh Q, RPH-CPP, 80 mg at 11/12/23 1657   ferrous sulfate  tablet 325 mg, 325 mg, Oral, Q breakfast, Albustami, Omar M, MD, 325 mg at 11/12/23 3875   folic acid  (FOLVITE ) tablet 1 mg, 1 mg, Oral, Daily, Janas Meadows, Lauren E, PA-C, 1 mg at 11/12/23 6433   haloperidol  lactate (HALDOL ) injection 4 mg, 4 mg, Intravenous, Q6H PRN, Ghimire, Shanker M, MD, 4 mg at 11/13/23 0415   insulin  aspart (novoLOG ) injection 0-15 Units, 0-15 Units, Subcutaneous, TID WC, Ree Candy, MD, 2 Units at 11/12/23 1146   ipratropium-albuterol  (DUONEB) 0.5-2.5 (3) MG/3ML nebulizer solution 3 mL, 3 mL, Nebulization, Q4H PRN, Janas Meadows, Lauren E, PA-C, 3 mL at 11/11/23 1758   levETIRAcetam  (KEPPRA ) undiluted  injection 500 mg, 500 mg, Intravenous, Q12H, Elgergawy,  Dawood S, MD, 500 mg at 11/12/23 2015   LORazepam  (ATIVAN ) tablet 1-4 mg, 1-4 mg, Oral, Q1H PRN, 2 mg at 11/12/23 2353 **OR** LORazepam  (ATIVAN ) injection 1-4 mg, 1-4 mg, Intravenous, Q1H PRN, Howerter, Justin B, DO, 4 mg at 11/12/23 0554   multivitamin with minerals tablet 1 tablet, 1 tablet, Oral, Daily, Autry, Lauren E, PA-C, 1 tablet at 11/12/23 6578   nicotine  (NICODERM CQ  - dosed in mg/24 hours) patch 21 mg, 21 mg, Transdermal, Daily, Autry, Lauren E, PA-C, 21 mg at 11/12/23 0825   ondansetron  (ZOFRAN ) injection 4 mg, 4 mg, Intravenous, Q6H PRN, Reesa Cannon N, DO, 4 mg at 11/08/23 0605   oxyCODONE  (Oxy IR/ROXICODONE ) immediate release tablet 5 mg, 5 mg, Oral, Q6H PRN, Ghimire, Shanker M, MD, 5 mg at 11/12/23 1732   pantoprazole  (PROTONIX ) EC tablet 40 mg, 40 mg, Oral, Daily, Autry, Lauren E, PA-C, 40 mg at 11/12/23 0824   polyethylene glycol (MIRALAX  / GLYCOLAX ) packet 17 g, 17 g, Oral, BID, Ree Candy, MD, 17 g at 11/12/23 4696   revefenacin  (YUPELRI ) nebulizer solution 175 mcg, 175 mcg, Nebulization, Daily, Autry, Lauren E, PA-C, 175 mcg at 11/12/23 2952   thiamine  (VITAMIN B1) 200 mg in sodium chloride  0.9 % 50 mL IVPB, 200 mg, Intravenous, Q24H, Stopped at 11/06/23 1300 **OR** thiamine  (VITAMIN B1) tablet 100 mg, 100 mg, Oral, Q24H, Autry, Lauren E, PA-C, 100 mg at 11/12/23 8413   zonisamide  (ZONEGRAN ) capsule 200 mg, 200 mg, Oral, Daily, Janas Meadows, Lauren E, PA-C, 200 mg at 11/12/23 2440  Allergies: Allergies  Allergen Reactions   Iron  Dextran Shortness Of Breath and Anxiety   Aspirin  Nausea And Vomiting and Other (See Comments)    Ok to take tylenol  or ibuprofen      Joice Nares, MD, PGY-2

## 2023-11-13 NOTE — Progress Notes (Signed)
 PROGRESS NOTE        PATIENT DETAILS Name: [name] Age: 58 y.o. Sex: female Date of Birth: [birth year] Admit Date: 11/04/2023 Admitting Physician Margaretann Sharper, MD UJW:[id number], No Pcp Per  Brief Summary: Patient is a 58 y.o.  female with history of EtOH/cocaine use, COPD on home O2, history of PE not on anticoagulation, depression/anxiety/schizophrenia, medication noncompliance-presented with anemia-admitted to the ICU due to alcohol  withdrawal symptoms.  Significant events: 5/14>> admit to ICU 5/17>> transferred to [facility]  Significant studies: 5/14>> CXR: No obvious PNA 5/14>> LUE Doppler: Left ulnar DVT. 5/21>> CXR: Right basilar atelectasis.  Significant microbiology data: None  Procedures: None  Consults: PCCM  Subjective: Was impulsive last night-trying to get out of bed-briefly required restraints.  Awake/alert this morning.  Objective: Vitals: Blood pressure (!) 97/59, pulse 95, temperature 98.3 F (36.8 C), temperature source Oral, resp. rate 20, height 5\' 2"  (1.575 m), weight 58.3 kg, last menstrual period 01/08/2011, SpO2 96%.   Exam: Awake/alert Chest: Clear to auscultation CVS: S1-S2 regular Abdomen: Soft nontender nondistended Neurology: Nonfocal  Pertinent Labs/Radiology:    Latest Ref Rng & Units 11/13/2023    5:17 AM 11/12/2023    4:23 AM 11/11/2023    3:37 AM  CBC  WBC 4.0 - 10.5 K/uL 4.5  5.0  4.8   Hemoglobin 12.0 - 15.0 g/dL 7.8  8.1  8.1   Hematocrit 36.0 - 46.0 % 26.6  27.7  28.4   Platelets 150 - 400 K/uL 260  253  271     Lab Results  Component Value Date   NA 137 11/12/2023   K 4.3 11/12/2023   CL 102 11/12/2023   CO2 24 11/12/2023     Assessment/Plan: EtOH withdrawal Improved-should be out of window for any alcohol  withdrawal Suspect current issues with impulsiveness/intermittent agitation mostly behavioral-psych related rather than related to EtOH Will likely complete Ativan  per CIWA  protocol taper today Has completed phenobarbital  taper on 5/20.    Acute toxic metabolic encephalopathy Felt to be multifactorial-EtOH withdrawal/possible postictal state from seizures. Overall much improved-continues to have intermittent episodes of agitation/impulsiveness-see below regarding plans to place a bedside sitter. Appreciate psych input Remains on Abilify -Depakote  added Getting IV thiamine -although doubt this is Korsakoff psychosis/Wernicke's-recent vitamin B1 on 5/9 was stable.   Seizure disorder No further seizures. Continue Keppra /zonisamide   Choking-aspiration episode on 5/21 Some cough but no obvious pneumonia on CXR Appreciate SLP eval-started on regular diet-which he seems to be tolerating. Monitor off antibiotics given clinical stability.  COPD-chronic hypoxic respiratory failure on 4-5 L of oxygen  at home Not in exacerbation Continue bronchodilators  DM-2 (A1c 6.5 on 5/15) Stable on SSI Resume metformin  on discharge  Recent Labs    11/12/23 1657 11/12/23 2058 11/13/23 0928  GLUCAP 86 184* 171*     Left upper extremity DVT Continue Lovenox  Very difficult situation-noncompliant-known history of drug/cocaine use-not ideal long-term anticoagulation candidate.  Discussed with daughter Basil Boston 5/22-she understands difficult situation-we both agree that we will continue anticoagulation while she is inpatient-and on discharge not place her on anticoagulation.  PAF Telemetry monitoring Not appropriate long-term candidate given drug use/noncompliance  History of schizoaffective disorder/bipolar disorder/depression Remains on Abilify  Depakote  being started by psych Discussed with patient regarding issues with impulsiveness/agitation-per patient-she usually wanders quite a bit at home as well-will place sitter-allowed to wander in the room/hallway-if she remains  stable-without any restraints over the next 25 hours-likely home over the weekend.  Tobacco  abuse Continue counseling  History of cocaine use Multiple UDS in the past positive for cocaine-claims she has not done cocaine since her husband passed away a few months back.   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   Code status:   Code Status: Full Code   DVT Prophylaxis: SCDs Start: 11/04/23 1310   Family Communication: Daughter-[name]-[phone] updated 5/23   Disposition Plan: Status is: Inpatient Remains inpatient appropriate because: Severity of illness   Planned Discharge Destination:Home   Diet: Diet Order             Diet regular Room service appropriate? Yes; Fluid consistency: Thin  Diet effective now                     Antimicrobial agents: Anti-infectives (From admission, onward)    None        MEDICATIONS: Scheduled Meds:  arformoterol   15 mcg Nebulization BID   ARIPiprazole   10 mg Oral Daily   budesonide  (PULMICORT ) nebulizer solution  0.25 mg Nebulization BID   divalproex   250 mg Oral Q12H   enoxaparin  (LOVENOX ) injection  80 mg Subcutaneous Q24H   ferrous sulfate   325 mg Oral Q breakfast   folic acid   1 mg Oral Daily   insulin  aspart  0-15 Units Subcutaneous TID WC   levETIRAcetam   500 mg Intravenous Q12H   multivitamin with minerals  1 tablet Oral Daily   nicotine   21 mg Transdermal Daily   pantoprazole   40 mg Oral Daily   polyethylene glycol  17 g Oral BID   revefenacin   175 mcg Nebulization Daily   thiamine   100 mg Oral Q24H   zonisamide   200 mg Oral Daily   Continuous Infusions:  thiamine  (VITAMIN B1) injection Stopped (11/06/23 1300)   PRN Meds:.benzonatate , butalbital -acetaminophen -caffeine , haloperidol  lactate, ipratropium-albuterol , LORazepam  **OR** LORazepam , ondansetron  (ZOFRAN ) IV, oxyCODONE    I have personally reviewed following labs and imaging studies  LABORATORY DATA: CBC: Recent Labs  Lab 11/09/23 0927  11/10/23 0931 11/11/23 0337 11/12/23 0423 11/13/23 0517  WBC 6.5 11.0* 4.8 5.0 4.5  HGB 8.1* 8.9* 8.1* 8.1* 7.8*  HCT 27.7* 30.9* 28.4* 27.7* 26.6*  MCV 73.9* 75.2* 74.9* 74.7* 74.1*  PLT PLATELET CLUMPS NOTED ON SMEAR, UNABLE TO ESTIMATE 279 271 253 260    Basic Metabolic Panel: Recent Labs  Lab 11/08/23 1430 11/09/23 0337 11/10/23 0931 11/11/23 0337 11/12/23 0423 11/13/23 0517  NA 134* 135 133* 137 137  --   K 3.9 4.0 4.4 3.8 4.3  --   CL 104 103 101 105 102  --   CO2 19* 20* 22 24 24   --   GLUCOSE 215* 115* 161* 126* 100*  --   BUN 5* 7 6 8 10   --   CREATININE 0.73 0.51 0.56 0.48 0.44  --   CALCIUM  8.1* 8.0* 8.9 8.5* 8.7*  --   MG 1.8 2.0 2.0 2.1 2.0 1.8  PHOS 4.7* 6.1* 4.0 3.8 5.7*  --     GFR: Estimated Creatinine Clearance: 59.9 mL/min (by C-G formula based on SCr of 0.44 mg/dL).  Liver Function Tests: Recent Labs  Lab 11/08/23 1430 11/11/23 0337  AST 29 32  ALT 18 19  ALKPHOS 61 51  BILITOT 0.4 <0.2  PROT 6.1* 6.4*  ALBUMIN  2.7* 2.9*   No results for input(s): "LIPASE", "AMYLASE"  in the last 168 hours. No results for input(s): "AMMONIA" in the last 168 hours.  Coagulation Profile: No results for input(s): "INR", "PROTIME" in the last 168 hours.  Cardiac Enzymes: No results for input(s): "CKTOTAL", "CKMB", "CKMBINDEX", "TROPONINI" in the last 168 hours.  BNP (last 3 results) No results for input(s): "PROBNP" in the last 8760 hours.  Lipid Profile: No results for input(s): "CHOL", "HDL", "LDLCALC", "TRIG", "CHOLHDL", "LDLDIRECT" in the last 72 hours.  Thyroid  Function Tests: No results for input(s): "TSH", "T4TOTAL", "FREET4", "T3FREE", "THYROIDAB" in the last 72 hours.  Anemia Panel: No results for input(s): "VITAMINB12", "FOLATE", "FERRITIN", "TIBC", "IRON ", "RETICCTPCT" in the last 72 hours.  Urine analysis:    Component Value Date/Time   COLORURINE STRAW (A) 11/12/2023 1826   APPEARANCEUR CLEAR 11/12/2023 1826   LABSPEC 1.006  11/12/2023 1826   PHURINE 5.0 11/12/2023 1826   GLUCOSEU >=500 (A) 11/12/2023 1826   HGBUR NEGATIVE 11/12/2023 1826   BILIRUBINUR NEGATIVE 11/12/2023 1826   KETONESUR NEGATIVE 11/12/2023 1826   PROTEINUR NEGATIVE 11/12/2023 1826   UROBILINOGEN 0.2 09/04/2013 0426   NITRITE NEGATIVE 11/12/2023 1826   LEUKOCYTESUR NEGATIVE 11/12/2023 1826    Sepsis Labs: Lactic Acid, Venous    Component Value Date/Time   LATICACIDVEN 3.1 (HH) 10/30/2023 0650    MICROBIOLOGY: Recent Results (from the past 240 hours)  MRSA Next Gen by PCR, Nasal     Status: Abnormal   Collection Time: 11/04/23  4:19 PM   Specimen: Nasal Mucosa; Nasal Swab  Result Value Ref Range Status   MRSA by PCR Next Gen DETECTED (A) NOT DETECTED Final    Comment: RESULT CALLED TO, READ BACK BY AND VERIFIED WITH: RN [facility] MEJIA ON 11/04/23 @ 1947 BY DRT (NOTE) The GeneXpert MRSA Assay (FDA approved for NASAL specimens only), is one component of a comprehensive MRSA colonization surveillance program. It is not intended to diagnose MRSA infection nor to guide or monitor treatment for MRSA infections. Test performance is not FDA approved in patients less than 22 years old. Performed at [facility], [address], [place], [place] [address]     RADIOLOGY STUDIES/RESULTS: No results found.    LOS: 9 days   Kimberly Penna, MD  [facility]    To contact the attending provider between 7A-7P or the covering provider during after hours 7P-7A, please log into the web site [email or web address] and access using universal [place] password for that web site. If you do not have the password, please call the hospital operator.  11/13/2023, 11:29 AM

## 2023-11-13 NOTE — Progress Notes (Signed)
   11/13/23 1205  Mobility  Activity Ambulated independently in hallway  Level of Assistance Standby assist, set-up cues, supervision of patient - no hands on  Assistive Device None  Distance Ambulated (ft) 800 ft  Activity Response Tolerated well  Mobility Referral Yes  Mobility visit 1 Mobility  Mobility Specialist Start Time (ACUTE ONLY) 1205  Mobility Specialist Stop Time (ACUTE ONLY) 1218  Mobility Specialist Time Calculation (min) (ACUTE ONLY) 13 min   Mobility Specialist: Progress Note  Pt agreeable to mobility session - received in EOB. Pt was asymptomatic throughout session with no complaints. Returned to chair with all needs met - call bell within reach.  ______________________________________________________________________________  Pt agreeable to mobility session - received in standing in room. Pt was asymptomatic throughout session with no complaints. No N/V. Returned to chair with all needs met - call bell within reach. Sitter present.   Isla Mari, BS Mobility Specialist Please contact via SecureChat or  Rehab office at [phone].Aaron Aas

## 2023-11-13 NOTE — Plan of Care (Signed)
 Patient is becoming more calm but still very confused and anxious.

## 2023-11-13 NOTE — Plan of Care (Signed)

## 2023-11-14 DIAGNOSIS — E1142 Type 2 diabetes mellitus with diabetic polyneuropathy: Secondary | ICD-10-CM

## 2023-11-14 DIAGNOSIS — D649 Anemia, unspecified: Secondary | ICD-10-CM | POA: Diagnosis not present

## 2023-11-14 DIAGNOSIS — I82622 Acute embolism and thrombosis of deep veins of left upper extremity: Secondary | ICD-10-CM | POA: Diagnosis not present

## 2023-11-14 DIAGNOSIS — F10930 Alcohol use, unspecified with withdrawal, uncomplicated: Secondary | ICD-10-CM | POA: Diagnosis not present

## 2023-11-14 DIAGNOSIS — Z794 Long term (current) use of insulin: Secondary | ICD-10-CM

## 2023-11-14 LAB — MAGNESIUM: Magnesium: 2 mg/dL (ref 1.7–2.4)

## 2023-11-14 LAB — CBC
HCT: 30.3 % — ABNORMAL LOW (ref 36.0–46.0)
Hemoglobin: 8.8 g/dL — ABNORMAL LOW (ref 12.0–15.0)
MCH: 21.7 pg — ABNORMAL LOW (ref 26.0–34.0)
MCHC: 29 g/dL — ABNORMAL LOW (ref 30.0–36.0)
MCV: 74.6 fL — ABNORMAL LOW (ref 80.0–100.0)
Platelets: 313 10*3/uL (ref 150–400)
RBC: 4.06 MIL/uL (ref 3.87–5.11)
RDW: 22.3 % — ABNORMAL HIGH (ref 11.5–15.5)
WBC: 5.3 10*3/uL (ref 4.0–10.5)
nRBC: 0 % (ref 0.0–0.2)

## 2023-11-14 LAB — GLUCOSE, CAPILLARY: Glucose-Capillary: 123 mg/dL — ABNORMAL HIGH (ref 70–99)

## 2023-11-14 MED ORDER — VITAMIN B-1 100 MG PO TABS: 100.0000 mg | ORAL_TABLET | ORAL | 1 refills | Status: DC

## 2023-11-14 MED ORDER — DIPHENHYDRAMINE HCL 50 MG/ML IJ SOLN: 25.0000 mg | Freq: Four times a day (QID) | INTRAMUSCULAR | Status: DC | PRN

## 2023-11-14 MED ORDER — FLUTICASONE FUROATE-VILANTEROL 100-25 MCG/ACT IN AEPB: 1.0000 | INHALATION_SPRAY | Freq: Every day | RESPIRATORY_TRACT | 1 refills | Status: DC

## 2023-11-14 MED ORDER — HYDROMORPHONE HCL 1 MG/ML IJ SOLN: 0.5000 mg | Freq: Once | INTRAMUSCULAR | Status: DC

## 2023-11-14 MED ORDER — DIVALPROEX SODIUM 125 MG PO CSDR: 250.0000 mg | DELAYED_RELEASE_CAPSULE | Freq: Two times a day (BID) | ORAL | 2 refills | Status: DC

## 2023-11-14 MED ORDER — DIPHENHYDRAMINE HCL 25 MG PO CAPS: 50.0000 mg | ORAL_CAPSULE | Freq: Four times a day (QID) | ORAL | Status: DC | PRN

## 2023-11-14 MED ORDER — LEVETIRACETAM 250 MG PO TABS
500.0000 mg | ORAL_TABLET | Freq: Two times a day (BID) | ORAL | Status: DC
  Administered 2023-11-14: 500 mg via ORAL
  Filled 2023-11-14: qty 2

## 2023-11-14 MED ORDER — INCRUSE ELLIPTA 62.5 MCG/ACT IN AEPB: 1.0000 | INHALATION_SPRAY | Freq: Every day | RESPIRATORY_TRACT | 1 refills | Status: DC

## 2023-11-14 NOTE — Plan of Care (Signed)
 Spoke with daughter [name], advised mother is ready for discharge.

## 2023-11-14 NOTE — Progress Notes (Signed)
 DISCHARGE NOTE HOME [name] to be discharged Home per MD order. Discussed prescriptions and follow up appointments with the patient. Prescriptions given to patient; medication list explained in detail. Patient verbalized understanding.  Skin clean, dry and intact without evidence of skin break down, no evidence of skin tears noted. IV catheter discontinued intact. Site without signs and symptoms of complications. Dressing and pressure applied. Pt denies pain at the site currently. No complaints noted.  Patient free of lines, drains, and wounds.   An After Visit Summary (AVS) was printed and given to the patient. Patient escorted via wheelchair, and discharged home via private auto.  Tonda Francisco, RN

## 2023-11-14 NOTE — Discharge Summary (Signed)
 PATIENT DETAILS Name: [name] Age: 58 y.o. Sex: female Date of Birth: 05-01-1966 MRN: [id number]. Admitting Physician: Margaretann Sharper, MD ION:[id number], No Pcp Per  Admit Date: 11/04/2023 Discharge date: 11/14/2023  Recommendations for Outpatient Follow-up:  Follow up with PCP in 1-2 weeks Please obtain CMP/CBC in one week  Admitted From:  Home  Disposition: Home   Discharge Condition: good  CODE STATUS:   Code Status: Full Code   Diet recommendation:  Diet Order             Diet - low sodium heart healthy           Diet Carb Modified           Diet regular Room service appropriate? Yes; Fluid consistency: Thin  Diet effective now                    Brief Summary: Patient is a 58 y.o.  female with history of EtOH/cocaine use, COPD on home O2, history of PE not on anticoagulation, depression/anxiety/schizophrenia, medication noncompliance-presented with anemia-admitted to the ICU due to alcohol  withdrawal symptoms.   Significant events: 5/14>> admit to ICU 5/17>> transferred to [facility]   Significant studies: 5/14>> CXR: No obvious PNA 5/14>> LUE Doppler: Left ulnar DVT. 5/21>> CXR: Right basilar atelectasis.   Significant microbiology data: None   Procedures: None   Consults: PCCM  Brief Hospital Course: EtOH withdrawal Overall improved-out of window for alcohol  withdrawal symptoms Treated with Ativan  per [facility] protocol Also required phenobarb taper Out of the window for any further withdrawal symptoms-she is impulsive at baseline.   Acute toxic metabolic encephalopathy Felt to be multifactorial-EtOH withdrawal/possible postictal state from seizures. Overall much improved-continued to have some intermittent episodes of agitation/impulsiveness-psychiatry was consulted-Depakote  was added-Abilify  was continued.  She likes to walk/and is very impulsive at baseline-hence a bedside sitter was placed-she was allowed to roam around the hallway and  in the room without any major issues overnight.  Stable for discharge.     Seizure disorder No further seizures. Continue Keppra /zonisamide    Choking-aspiration episode on 5/21 Some cough but no obvious pneumonia on CXR Appreciate SLP eval-started on regular diet-which he seems to be tolerating. Monitor off antibiotics given clinical stability.   COPD-chronic hypoxic respiratory failure on 4-5 L of oxygen  at home Not in exacerbation Continue bronchodilators   DM-2 (A1c 6.5 on 5/15) Stable on SSI Resume metformin  on discharge   Left upper extremity DVT Continue Lovenox  Very difficult situation-noncompliant with current medications-known history of drug/cocaine use-not ideal long-term anticoagulation candidate.  Discussed with daughter Basil Boston 5/22-she understands difficult situation-we both agree that we will continue anticoagulation while she is inpatient-and on discharge not place her on anticoagulation-she is likely not going to take the medication in the first place.   PAF Telemetry monitoring Not appropriate long-term candidate given drug use/noncompliance   History of schizoaffective disorder/bipolar disorder/depression Remains on Abilify  Depakote  prescribed on discharge Psychiatry consulted during this hospitalization Discussed with daughter-[name]-social worker here in the hospital-difficult situation-patient is very noncompliant with the medications.  She apparently has all her medications at home-Depakote  will be prescribed on discharge.     Tobacco abuse Continue counseling   History of cocaine use Multiple UDS in the past positive for cocaine-claims she has not done cocaine since her husband passed away a few months back.   Continue counseling   + RPR Appreciate ID input-consistent with old infection that has been treated-no concern for neurosyphilis.  Discharge Diagnoses:  Principal  Problem:   Alcohol  withdrawal (HCC) Active Problems:   History of  syphilis   Discharge Instructions:  Activity:  As tolerated    Discharge Instructions     Call MD for:  difficulty breathing, headache or visual disturbances   Complete by: As directed    Call MD for:  persistant nausea and vomiting   Complete by: As directed    Diet - low sodium heart healthy   Complete by: As directed    Diet Carb Modified   Complete by: As directed    Discharge instructions   Complete by: As directed    Follow with Primary MD  in 1-2 weeks  Follow-up with your primary psychiatrist  It is highly advisable that you stop any further alcohol  use and further cocaine use.  Please get a complete blood count and chemistry panel checked by your Primary MD at your next visit, and again as instructed by your Primary MD.  Get Medicines reviewed and adjusted: Please take all your medications with you for your next visit with your Primary MD  Laboratory/radiological data: Please request your Primary MD to go over all hospital tests and procedure/radiological results at the follow up, please ask your Primary MD to get all Hospital records sent to his/her office.  In some cases, they will be blood work, cultures and biopsy results pending at the time of your discharge. Please request that your primary care M.D. follows up on these results.  Also Note the following: If you experience worsening of your admission symptoms, develop shortness of breath, life threatening emergency, suicidal or homicidal thoughts you must seek medical attention immediately by calling 911 or calling your MD immediately  if symptoms less severe.  You must read complete instructions/literature along with all the possible adverse reactions/side effects for all the Medicines you take and that have been prescribed to you. Take any new Medicines after you have completely understood and accpet all the possible adverse reactions/side effects.   Do not drive when taking Pain medications or sleeping  medications (Benzodaizepines)  Do not take more than prescribed Pain, Sleep and Anxiety Medications. It is not advisable to combine anxiety,sleep and pain medications without talking with your primary care practitioner  Special Instructions: If you have smoked or chewed Tobacco  in the last 2 yrs please stop smoking, stop any regular Alcohol   and or any Recreational drug use.  Wear Seat belts while driving.  Please note: You were cared for by a hospitalist during your hospital stay. Once you are discharged, your primary care physician will handle any further medical issues. Please note that NO REFILLS for any discharge medications will be authorized once you are discharged, as it is imperative that you return to your primary care physician (or establish a relationship with a primary care physician if you do not have one) for your post hospital discharge needs so that they can reassess your need for medications and monitor your lab values.   Increase activity slowly   Complete by: As directed       Allergies as of 11/14/2023       Reactions   Iron  Dextran Shortness Of Breath, Anxiety   Aspirin  Nausea And Vomiting, Other (See Comments)   Ok to take tylenol  or ibuprofen         Medication List     STOP taking these medications    doxycycline  100 MG tablet Commonly known as: VIBRA -TABS   nicotine  21 mg/24hr patch Commonly known as: NICODERM CQ  -  dosed in mg/24 hours       TAKE these medications    albuterol  108 (90 Base) MCG/ACT inhaler Commonly known as: VENTOLIN  HFA Inhale 1-2 puffs into the lungs every 6 (six) hours as needed for wheezing or shortness of breath.   ARIPiprazole  10 MG tablet Commonly known as: ABILIFY  Take 1 tablet (10 mg total) by mouth daily.   divalproex  125 MG capsule Commonly known as: DEPAKOTE  SPRINKLE Take 2 capsules (250 mg total) by mouth every 12 (twelve) hours.   fluticasone  furoate-vilanterol 100-25 MCG/ACT Aepb Commonly known as: Breo  Ellipta Inhale 1 puff into the lungs daily.   Incruse Ellipta  62.5 MCG/ACT Aepb Generic drug: umeclidinium bromide  Inhale 1 puff into the lungs daily.   ipratropium-albuterol  0.5-2.5 (3) MG/3ML Soln Commonly known as: DUONEB Take 3 mLs by nebulization every 6 (six) hours as needed (for shortness of breath or wheezing).   levETIRAcetam  500 MG 24 hr tablet Commonly known as: KEPPRA  XR Take 1 tablet (500 mg total) by mouth 2 (two) times daily.   metFORMIN  750 MG 24 hr tablet Commonly known as: GLUCOPHAGE -XR Take 1 tablet (750 mg total) by mouth daily with breakfast.   OXYGEN  Inhale 4-5 L/min into the lungs as needed (for COPD-related symptoms).   thiamine  100 MG tablet Commonly known as: Vitamin B-1 Take 1 tablet (100 mg total) by mouth daily.   zonisamide  100 MG capsule Commonly known as: Zonegran  Take 2 capsules (200 mg total) by mouth daily.        Follow-up Information     Primary care practitioner. Schedule an appointment as soon as possible for a visit in 1 week(s).                 Allergies  Allergen Reactions   Iron  Dextran Shortness Of Breath and Anxiety   Aspirin  Nausea And Vomiting and Other (See Comments)    Ok to take tylenol  or ibuprofen       Other Procedures/Studies: DG Chest Port 1 View Result Date: 11/11/2023 CLINICAL DATA:  Hypoxia, wheezing EXAM: PORTABLE CHEST 1 VIEW COMPARISON:  11/10/2023 FINDINGS: Stable right basilar atelectasis. Lungs are otherwise clear. No pneumothorax or pleural effusion. Cardiac size within normal limits. Pulmonary vascularity is normal. No acute bone abnormality. IMPRESSION: 1. Right basilar atelectasis. Electronically Signed   By: Worthy Heads M.D.   On: 11/11/2023 02:51   DG Chest Port 1 View Result Date: 11/10/2023 CLINICAL DATA:  10031 Cough 10031 EXAM: PORTABLE CHEST 1 VIEW COMPARISON:  Chest x-ray 11/04/2023, CT chest 06/28/2023 FINDINGS: Patient is rotated. The heart and mediastinal contours are unchanged.  Atherosclerotic plaque. Low lung volumes. No focal consolidation. No pulmonary edema. No pleural effusion. No pneumothorax. No acute osseous abnormality. IMPRESSION: Low lung volumes with no active disease. Patient is rotated. Recommend repeat PA and lateral view of the chest for further evaluation. Electronically Signed   By: Morgane  Naveau M.D.   On: 11/10/2023 00:29   UE Venous Duplex ([facility] and [facility] ONLY) Result Date: 11/04/2023 UPPER VENOUS STUDY  Patient Name:  RAILYN HOUSE  Date of Exam:   11/04/2023 Medical Rec #: [id number]       Accession #:    [id number] Date of Birth: [birth year]      Patient Gender: F Patient Age:   82 years Exam Location:  [facility] Procedure:      VAS US  UPPER EXTREMITY VENOUS DUPLEX Referring Phys: Rosealee Concha --------------------------------------------------------------------------------  Indications: Pain, and Swelling Limitations: Patient movement. Comparison Study: No  previous exams Performing Technologist: Jody Hill RVT, RDMS  Examination Guidelines: A complete evaluation includes B-mode imaging, spectral Doppler, color Doppler, and power Doppler as needed of all accessible portions of each vessel. Bilateral testing is considered an integral part of a complete examination. Limited examinations for reoccurring indications may be performed as noted.  Right Findings: +----------+------------+---------+-----------+----------+--------------------+ RIGHT     CompressiblePhasicitySpontaneousProperties      Summary        +----------+------------+---------+-----------+----------+--------------------+ Subclavian               Yes       Yes                   patent by                                                              color/doppler     +----------+------------+---------+-----------+----------+--------------------+  Left Findings: +----------+------------+---------+-----------+----------+--------------+ LEFT       CompressiblePhasicitySpontaneousProperties   Summary     +----------+------------+---------+-----------+----------+--------------+ IJV           Full       Yes       Yes                             +----------+------------+---------+-----------+----------+--------------+ Subclavian    Full       Yes       Yes                             +----------+------------+---------+-----------+----------+--------------+ Axillary      Full       Yes       Yes                             +----------+------------+---------+-----------+----------+--------------+ Brachial      Full       Yes       Yes                             +----------+------------+---------+-----------+----------+--------------+ Radial        Full                                                 +----------+------------+---------+-----------+----------+--------------+ Ulnar         None       No        No                   Acute      +----------+------------+---------+-----------+----------+--------------+ Cephalic                                            Not visualized +----------+------------+---------+-----------+----------+--------------+ Basilic       Full       Yes       Yes                             +----------+------------+---------+-----------+----------+--------------+  Summary:  Right: No evidence of thrombosis in the subclavian.  Left: No evidence of superficial vein thrombosis in the upper extremity. Findings consistent with acute deep vein thrombosis involving the left ulnar veins.  *See table(s) above for measurements and observations.  Diagnosing physician: Jimmye Moulds MD Electronically signed by Jimmye Moulds MD on 11/04/2023 at 2:17:10 PM.    Final    DG Chest Portable 1 View Result Date: 11/04/2023 CLINICAL DATA:  Shortness of breath. EXAM: PORTABLE CHEST 1 VIEW COMPARISON:  AP Lat chest yesterday at 5:50 a.m. FINDINGS: 4:29 a.m. Stable mild cardiomegaly. Central  vessels are borderline prominent. No edema is seen. Increased linear atelectasis noted right lung base. Relatively lower inspiration today but no focal pneumonia is evident. The sulci are sharp. The mediastinum is normally outlined. No new osseous abnormality. Telemetry leads overlie both sides of the chest. IMPRESSION: 1. Stable mild cardiomegaly. Borderline central vascular prominence without edema. 2. Increased linear atelectasis right lung base. Electronically Signed   By: Denman Fischer M.D.   On: 11/04/2023 05:06   DG Chest 2 View Result Date: 11/03/2023 CLINICAL DATA:  58 year old female with chest pain and shortness of breath. EXAM: CHEST - 2 VIEW COMPARISON:  Portable chest 10/30/2023 and earlier. FINDINGS: AP and lateral views 0549 hours. Lung volumes are stable. Mediastinal contours remain normal. Visualized tracheal air column is within normal limits. No pneumothorax, pulmonary edema, pleural effusion or consolidation. No confluent lung opacity. Chronic thoracic compression fractures better demonstrated by CT in January. No acute osseous abnormality identified. Negative visible bowel gas. IMPRESSION: 1.  No acute cardiopulmonary abnormality. 2. Chronic thoracic compression fractures. Electronically Signed   By: Marlise Simpers M.D.   On: 11/03/2023 06:12   DG Chest Port 1 View Result Date: 10/30/2023 CLINICAL DATA:  Cough EXAM: PORTABLE CHEST 1 VIEW COMPARISON:  10/29/2023 FINDINGS: The heart size and mediastinal contours are within normal limits. Both lungs are clear. The visualized skeletal structures are unremarkable. IMPRESSION: No active disease. Electronically Signed   By: Violeta Grey M.D.   On: 10/30/2023 01:23   CT Head Wo Contrast Result Date: 10/29/2023 CLINICAL DATA:  Hyperglycemia, alcohol  intoxication, blunt trauma from fall. EXAM: CT HEAD WITHOUT CONTRAST CT CERVICAL SPINE WITHOUT CONTRAST TECHNIQUE: Multidetector CT imaging of the head and cervical spine was performed following the  standard protocol without intravenous contrast. Multiplanar CT image reconstructions of the cervical spine were also generated. RADIATION DOSE REDUCTION: This exam was performed according to the departmental dose-optimization program which includes automated exposure control, adjustment of the mA and/or kV according to patient size and/or use of iterative reconstruction technique. COMPARISON:  CT head and cervical spine 10/07/2023 FINDINGS: CT HEAD FINDINGS Brain: No intracranial hemorrhage, mass effect, or evidence of acute infarct. No hydrocephalus. No extra-axial fluid collection. Vascular: No hyperdense vessel or unexpected calcification. Skull: No fracture or focal lesion. Sinuses/Orbits: No acute finding. Paranasal sinuses and mastoid air cells are well aerated. Other: None. CT CERVICAL SPINE FINDINGS Alignment: No evidence of traumatic malalignment. Skull base and vertebrae: No acute fracture. No primary bone lesion or focal pathologic process. Soft tissues and spinal canal: No prevertebral fluid or swelling. No visible canal hematoma. 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 Upper chest: Negative. Other: None. IMPRESSION: No acute intracranial abnormality. No cervical spine fracture. Electronically Signed   By: Rozell Cornet M.D.   On: 10/29/2023 21:59   CT Cervical  Spine Wo Contrast Result Date: 10/29/2023 CLINICAL DATA:  Hyperglycemia, alcohol  intoxication, blunt trauma from fall. EXAM: CT HEAD WITHOUT CONTRAST CT CERVICAL SPINE WITHOUT CONTRAST TECHNIQUE: Multidetector CT imaging of the head and cervical spine was performed following the standard protocol without intravenous contrast. Multiplanar CT image reconstructions of the cervical spine were also generated. RADIATION DOSE REDUCTION: This exam was performed according to the departmental dose-optimization program which includes  automated exposure control, adjustment of the mA and/or kV according to patient size and/or use of iterative reconstruction technique. COMPARISON:  CT head and cervical spine 10/07/2023 FINDINGS: CT HEAD FINDINGS Brain: No intracranial hemorrhage, mass effect, or evidence of acute infarct. No hydrocephalus. No extra-axial fluid collection. Vascular: No hyperdense vessel or unexpected calcification. Skull: No fracture or focal lesion. Sinuses/Orbits: No acute finding. Paranasal sinuses and mastoid air cells are well aerated. Other: None. CT CERVICAL SPINE FINDINGS Alignment: No evidence of traumatic malalignment. Skull base and vertebrae: No acute fracture. No primary bone lesion or focal pathologic process. Soft tissues and spinal canal: No prevertebral fluid or swelling. No visible canal hematoma. 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 Upper chest: Negative. Other: None. IMPRESSION: No acute intracranial abnormality. No cervical spine fracture. Electronically Signed   By: Rozell Cornet M.D.   On: 10/29/2023 21:59   DG Chest Port 1 View Result Date: 10/29/2023 CLINICAL DATA:  Shortness of breath EXAM: PORTABLE CHEST 1 VIEW COMPARISON:  10/28/2023 FINDINGS: The heart size and mediastinal contours are within normal limits. Both lungs are clear. The visualized skeletal structures are unremarkable. IMPRESSION: No active disease. Electronically Signed   By: Janeece Mechanic M.D.   On: 10/29/2023 02:00   DG Chest Port 1 View Result Date: 10/28/2023 CLINICAL DATA:  Shortness of breath EXAM: PORTABLE CHEST 1 VIEW COMPARISON:  09/28/2023 FINDINGS: Stable cardiomediastinal silhouette. No focal consolidation, pleural effusion, or pneumothorax. No displaced rib fractures. IMPRESSION: No active disease. Electronically Signed   By: Rozell Cornet M.D.   On: 10/28/2023 02:27     TODAY-DAY OF  DISCHARGE:  Subjective:   [name] today has no headache,no chest abdominal pain,no new weakness tingling or numbness, feels much better wants to go home today.   Objective:   Blood pressure (!) 142/70, pulse 100, temperature 97.8 F (36.6 C), temperature source Oral, resp. rate (!) 21, height 5\' 2"  (1.575 m), weight 58.3 kg, last menstrual period 01/08/2011, SpO2 99%.  Intake/Output Summary (Last 24 hours) at 11/14/2023 1003 Last data filed at 11/14/2023 0856 Gross per 24 hour  Intake 2040 ml  Output --  Net 2040 ml   Filed Weights   11/11/23 0500 11/12/23 0500 11/13/23 0416  Weight: 57.1 kg 57.6 kg 58.3 kg    Exam: Awake Alert, Oriented *3, No new F.N deficits, Normal affect [place].AT,PERRAL Supple Neck,No JVD, No cervical lymphadenopathy appriciated.  Symmetrical Chest wall movement, Good air movement bilaterally, CTAB RRR,No Gallops,Rubs or new Murmurs, No Parasternal Heave +ve B.Sounds, Abd Soft, Non tender, No organomegaly appriciated, No rebound -guarding or rigidity. No Cyanosis, Clubbing or edema, No new Rash or bruise   PERTINENT RADIOLOGIC STUDIES: No results found.   PERTINENT LAB RESULTS: CBC: Recent Labs    11/13/23 0517 11/14/23 0647  WBC 4.5 5.3  HGB 7.8* 8.8*  HCT 26.6* 30.3*  PLT 260 313   CMET CMP     Component Value Date/Time   NA 137 11/12/2023 0423  K 4.3 11/12/2023 0423   CL 102 11/12/2023 0423   CO2 24 11/12/2023 0423   GLUCOSE 100 (H) 11/12/2023 0423   BUN 10 11/12/2023 0423   CREATININE 0.44 11/12/2023 0423   CREATININE 0.57 03/28/2021 0000   CALCIUM  8.7 (L) 11/12/2023 0423   PROT 6.4 (L) 11/11/2023 0337   ALBUMIN  2.9 (L) 11/11/2023 0337   AST 32 11/11/2023 0337   ALT 19 11/11/2023 0337   ALKPHOS 51 11/11/2023 0337   BILITOT <0.2 11/11/2023 0337   GFR 87.26 11/04/2011 0911   EGFR 107 03/28/2021 0000   GFRNONAA >60 11/12/2023 0423    GFR Estimated Creatinine Clearance: 59.9 mL/min (by C-G formula based on SCr of 0.44  mg/dL). No results for input(s): "LIPASE", "AMYLASE" in the last 72 hours. No results for input(s): "CKTOTAL", "CKMB", "CKMBINDEX", "TROPONINI" in the last 72 hours. Invalid input(s): "POCBNP" No results for input(s): "DDIMER" in the last 72 hours. No results for input(s): "HGBA1C" in the last 72 hours. No results for input(s): "CHOL", "HDL", "LDLCALC", "TRIG", "CHOLHDL", "LDLDIRECT" in the last 72 hours. No results for input(s): "TSH", "T4TOTAL", "T3FREE", "THYROIDAB" in the last 72 hours.  Invalid input(s): "FREET3" No results for input(s): "VITAMINB12", "FOLATE", "FERRITIN", "TIBC", "IRON ", "RETICCTPCT" in the last 72 hours. Coags: No results for input(s): "INR" in the last 72 hours.  Invalid input(s): "PT" Microbiology: Recent Results (from the past 240 hours)  MRSA Next Gen by PCR, Nasal     Status: Abnormal   Collection Time: 11/04/23  4:19 PM   Specimen: Nasal Mucosa; Nasal Swab  Result Value Ref Range Status   MRSA by PCR Next Gen DETECTED (A) NOT DETECTED Final    Comment: RESULT CALLED TO, READ BACK BY AND VERIFIED WITH: RN [facility] MEJIA ON 11/04/23 @ 1947 BY DRT (NOTE) The GeneXpert MRSA Assay (FDA approved for NASAL specimens only), is one component of a comprehensive MRSA colonization surveillance program. It is not intended to diagnose MRSA infection nor to guide or monitor treatment for MRSA infections. Test performance is not FDA approved in patients less than 33 years old. Performed at [facility], [address], [place], [place] [address]     FURTHER DISCHARGE INSTRUCTIONS:  Get Medicines reviewed and adjusted: Please take all your medications with you for your next visit with your Primary MD  Laboratory/radiological data: Please request your Primary MD to go over all hospital tests and procedure/radiological results at the follow up, please ask your Primary MD to get all Hospital records sent to his/her office.  In some cases, they will be blood  work, cultures and biopsy results pending at the time of your discharge. Please request that your primary care M.D. goes through all the records of your hospital data and follows up on these results.  Also Note the following: If you experience worsening of your admission symptoms, develop shortness of breath, life threatening emergency, suicidal or homicidal thoughts you must seek medical attention immediately by calling 911 or calling your MD immediately  if symptoms less severe.  You must read complete instructions/literature along with all the possible adverse reactions/side effects for all the Medicines you take and that have been prescribed to you. Take any new Medicines after you have completely understood and accpet all the possible adverse reactions/side effects.   Do not drive when taking Pain medications or sleeping medications (Benzodaizepines)  Do not take more than prescribed Pain, Sleep and Anxiety Medications. It is not advisable to combine anxiety,sleep and pain medications  without talking with your primary care practitioner  Special Instructions: If you have smoked or chewed Tobacco  in the last 2 yrs please stop smoking, stop any regular Alcohol   and or any Recreational drug use.  Wear Seat belts while driving.  Please note: You were cared for by a hospitalist during your hospital stay. Once you are discharged, your primary care physician will handle any further medical issues. Please note that NO REFILLS for any discharge medications will be authorized once you are discharged, as it is imperative that you return to your primary care physician (or establish a relationship with a primary care physician if you do not have one) for your post hospital discharge needs so that they can reassess your need for medications and monitor your lab values.  Total Time spent coordinating discharge including counseling, education and face to face time equals greater than 30  minutes.  Signed: Lakaisha Danish 11/14/2023 10:03 AM

## 2023-11-16 ENCOUNTER — Emergency Department (HOSPITAL_COMMUNITY)

## 2023-11-16 ENCOUNTER — Emergency Department (HOSPITAL_COMMUNITY)
Admission: EM | Admit: 2023-11-16 | Discharge: 2023-11-16 | Disposition: A | Discharge status: Home / Self Care | Attending: Emergency Medicine | Admitting: Emergency Medicine

## 2023-11-16 ENCOUNTER — Other Ambulatory Visit: Payer: Self-pay

## 2023-11-16 ENCOUNTER — Encounter (HOSPITAL_COMMUNITY): Payer: Self-pay

## 2023-11-16 DIAGNOSIS — R1012 Left upper quadrant pain: Secondary | ICD-10-CM | POA: Diagnosis not present

## 2023-11-16 DIAGNOSIS — Z7984 Long term (current) use of oral hypoglycemic drugs: Secondary | ICD-10-CM | POA: Insufficient documentation

## 2023-11-16 DIAGNOSIS — E039 Hypothyroidism, unspecified: Secondary | ICD-10-CM | POA: Diagnosis not present

## 2023-11-16 DIAGNOSIS — X58XXXA Exposure to other specified factors, initial encounter: Secondary | ICD-10-CM | POA: Insufficient documentation

## 2023-11-16 DIAGNOSIS — E1140 Type 2 diabetes mellitus with diabetic neuropathy, unspecified: Secondary | ICD-10-CM | POA: Insufficient documentation

## 2023-11-16 DIAGNOSIS — S39012A Strain of muscle, fascia and tendon of lower back, initial encounter: Secondary | ICD-10-CM | POA: Diagnosis not present

## 2023-11-16 DIAGNOSIS — I10 Essential (primary) hypertension: Secondary | ICD-10-CM | POA: Insufficient documentation

## 2023-11-16 DIAGNOSIS — R1011 Right upper quadrant pain: Secondary | ICD-10-CM | POA: Diagnosis not present

## 2023-11-16 DIAGNOSIS — M549 Dorsalgia, unspecified: Secondary | ICD-10-CM | POA: Diagnosis present

## 2023-11-16 DIAGNOSIS — J449 Chronic obstructive pulmonary disease, unspecified: Secondary | ICD-10-CM | POA: Insufficient documentation

## 2023-11-16 LAB — CBC WITH DIFFERENTIAL/PLATELET
Abs Immature Granulocytes: 0 10*3/uL (ref 0.00–0.07)
Basophils Absolute: 0 10*3/uL (ref 0.0–0.1)
Basophils Relative: 0 %
Eosinophils Absolute: 0.1 10*3/uL (ref 0.0–0.5)
Eosinophils Relative: 1 %
HCT: 27.6 % — ABNORMAL LOW (ref 36.0–46.0)
Hemoglobin: 8.1 g/dL — ABNORMAL LOW (ref 12.0–15.0)
Lymphocytes Relative: 5 %
Lymphs Abs: 0.4 10*3/uL — ABNORMAL LOW (ref 0.7–4.0)
MCH: 21.8 pg — ABNORMAL LOW (ref 26.0–34.0)
MCHC: 29.3 g/dL — ABNORMAL LOW (ref 30.0–36.0)
MCV: 74.2 fL — ABNORMAL LOW (ref 80.0–100.0)
Monocytes Absolute: 0.3 10*3/uL (ref 0.1–1.0)
Monocytes Relative: 4 %
Neutro Abs: 7.2 10*3/uL (ref 1.7–7.7)
Neutrophils Relative %: 90 %
Platelets: 353 10*3/uL (ref 150–400)
RBC: 3.72 MIL/uL — ABNORMAL LOW (ref 3.87–5.11)
RDW: 23 % — ABNORMAL HIGH (ref 11.5–15.5)
WBC: 8 10*3/uL (ref 4.0–10.5)
nRBC: 0 % (ref 0.0–0.2)
nRBC: 0 /100{WBCs}

## 2023-11-16 LAB — URINALYSIS, W/ REFLEX TO CULTURE (INFECTION SUSPECTED)
Bilirubin Urine: NEGATIVE
Glucose, UA: NEGATIVE mg/dL
Hgb urine dipstick: NEGATIVE
Ketones, ur: NEGATIVE mg/dL
Leukocytes,Ua: NEGATIVE
Nitrite: NEGATIVE
Protein, ur: NEGATIVE mg/dL
Specific Gravity, Urine: 1.02 (ref 1.005–1.030)
pH: 5 (ref 5.0–8.0)

## 2023-11-16 LAB — COMPREHENSIVE METABOLIC PANEL WITH GFR
ALT: 21 U/L (ref 0–44)
AST: 29 U/L (ref 15–41)
Albumin: 3.5 g/dL (ref 3.5–5.0)
Alkaline Phosphatase: 75 U/L (ref 38–126)
Anion gap: 8 (ref 5–15)
BUN: 6 mg/dL (ref 6–20)
CO2: 24 mmol/L (ref 22–32)
Calcium: 8.8 mg/dL — ABNORMAL LOW (ref 8.9–10.3)
Chloride: 101 mmol/L (ref 98–111)
Creatinine, Ser: 0.52 mg/dL (ref 0.44–1.00)
GFR, Estimated: >60 mL/min (ref >60)
Glucose, Bld: 142 mg/dL — ABNORMAL HIGH (ref 70–99)
Potassium: 4 mmol/L (ref 3.5–5.1)
Sodium: 133 mmol/L — ABNORMAL LOW (ref 135–145)
Total Bilirubin: 0.6 mg/dL (ref 0.0–1.2)
Total Protein: 7.1 g/dL (ref 6.5–8.1)

## 2023-11-16 LAB — LIPASE, BLOOD: Lipase: 35 U/L (ref 11–51)

## 2023-11-16 LAB — I-STAT CHEM 8, ED
BUN: 5 mg/dL — ABNORMAL LOW (ref 6–20)
Calcium, Ion: 1.11 mmol/L — ABNORMAL LOW (ref 1.15–1.40)
Chloride: 101 mmol/L (ref 98–111)
Creatinine, Ser: 0.5 mg/dL (ref 0.44–1.00)
Glucose, Bld: 143 mg/dL — ABNORMAL HIGH (ref 70–99)
HCT: 29 % — ABNORMAL LOW (ref 36.0–46.0)
Hemoglobin: 9.9 g/dL — ABNORMAL LOW (ref 12.0–15.0)
Potassium: 4.1 mmol/L (ref 3.5–5.1)
Sodium: 135 mmol/L (ref 135–145)
TCO2: 23 mmol/L (ref 22–32)

## 2023-11-16 MED ORDER — SODIUM CHLORIDE 0.9 % IV BOLUS
500.0000 mL | Freq: Once | INTRAVENOUS | Status: AC
  Administered 2023-11-16: 500 mL via INTRAVENOUS

## 2023-11-16 MED ORDER — THIAMINE MONONITRATE 100 MG PO TABS
100.0000 mg | ORAL_TABLET | Freq: Every day | ORAL | Status: DC
  Administered 2023-11-16: 100 mg via ORAL
  Filled 2023-11-16: qty 1

## 2023-11-16 MED ORDER — LORAZEPAM 2 MG/ML IJ SOLN
1.0000 mg | Freq: Once | INTRAMUSCULAR | Status: AC
  Administered 2023-11-16: 1 mg via INTRAVENOUS
  Filled 2023-11-16: qty 1

## 2023-11-16 MED ORDER — THIAMINE HCL 100 MG/ML IJ SOLN: 100.0000 mg | Freq: Every day | INTRAMUSCULAR | Status: DC

## 2023-11-16 MED ORDER — ALBUTEROL SULFATE HFA 108 (90 BASE) MCG/ACT IN AERS
1.0000 | INHALATION_SPRAY | Freq: Once | RESPIRATORY_TRACT | Status: AC
  Administered 2023-11-16: 1 via RESPIRATORY_TRACT
  Filled 2023-11-16: qty 6.7

## 2023-11-16 MED ORDER — FOLIC ACID 1 MG PO TABS
1.0000 mg | ORAL_TABLET | Freq: Every day | ORAL | Status: DC
  Administered 2023-11-16: 1 mg via ORAL
  Filled 2023-11-16: qty 1

## 2023-11-16 MED ORDER — LORAZEPAM 1 MG PO TABS
1.0000 mg | ORAL_TABLET | Freq: Once | ORAL | Status: AC
  Administered 2023-11-16: 1 mg via ORAL
  Filled 2023-11-16: qty 1

## 2023-11-16 MED ORDER — IOHEXOL 350 MG/ML SOLN
75.0000 mL | Freq: Once | INTRAVENOUS | Status: AC | PRN
  Administered 2023-11-16: 75 mL via INTRAVENOUS

## 2023-11-16 MED ORDER — IOHEXOL 350 MG/ML SOLN
75.0000 mL | Freq: Once | INTRAVENOUS | Status: DC | PRN
Start: 2023-11-16 — End: 2023-11-16

## 2023-11-16 NOTE — ED Notes (Signed)
 Went to CT

## 2023-11-16 NOTE — ED Notes (Signed)
Went to Ultrasound.

## 2023-11-16 NOTE — Discharge Instructions (Addendum)
 CT scan showing incidental finding of bilateral thyroid  nodules - this will need to be followed up with your primary care provider. Seek emergency care if experiencing any new or worsening symptoms.    Alternating between 650 mg Tylenol  and 400 mg Advil : The best way to alternate taking Acetaminophen  (example Tylenol ) and Ibuprofen  (example Advil /Motrin ) is to take them 3 hours apart. For example, if you take ibuprofen  at 6 am you can then take Tylenol  at 9 am. You can continue this regimen throughout the day, making sure you do not exceed the recommended maximum dose for each drug.

## 2023-11-16 NOTE — ED Provider Notes (Signed)
 [name]   CSN: [id number] Arrival date & time: 11/16/23  4098     History  Chief Complaint  Patient presents with   Back Pain    [name] is a 58 y.o. female with PMHx schizophrenia, chronic back pain, COPD, DM, diabetic neuropathy, GERD, headaches, HTN, HLD, hypothyroidism, polysubstance abuse, left upper extremity DVT not on anticoagulation who presents to ED concerned for back pain. Pain was present when patient woke up this morning. Patient telling EMS that she took Doxycycline  for the pain on an empty stomach and now has nausea as well. Patient also endorsing LUQ and RUQ pain as well. Patient also stating that the pain is causing her to be SOB and she used 3 puffs of albuterol  inhaler this morning.   Patient denies urinary retention, fecal incontinence, saddle anesthesia, fever, immunosuppression, IVDU, spinal procedure, significant trauma. Denies chest pain, vomiting, diarrhea. Last BM yesterday.   Back Pain      Home Medications Prior to Admission medications   Medication Sig Start Date End Date Taking? Authorizing Provider  albuterol  (VENTOLIN  HFA) 108 (90 Base) MCG/ACT inhaler Inhale 1-2 puffs into the lungs every 6 (six) hours as needed for wheezing or shortness of breath. 10/29/23   Palumbo, April, MD  ARIPiprazole  (ABILIFY ) 10 MG tablet Take 1 tablet (10 mg total) by mouth daily. 11/01/23   Samtani, Jai-Gurmukh, MD  divalproex  (DEPAKOTE  SPRINKLE) 125 MG capsule Take 2 capsules (250 mg total) by mouth every 12 (twelve) hours. 11/14/23   Ghimire, Estil Heman, MD  fluticasone  furoate-vilanterol (BREO ELLIPTA ) 100-25 MCG/ACT AEPB Inhale 1 puff into the lungs daily. 11/14/23   Ghimire, Estil Heman, MD  ipratropium-albuterol  (DUONEB) 0.5-2.5 (3) MG/3ML SOLN Take 3 mLs by nebulization every 6 (six) hours as needed (for shortness of breath or wheezing).    [provider]  levETIRAcetam  (KEPPRA  XR) 500 MG 24 hr  tablet Take 1 tablet (500 mg total) by mouth 2 (two) times daily. 10/31/23   Samtani, Jai-Gurmukh, MD  metFORMIN  (GLUCOPHAGE -XR) 750 MG 24 hr tablet Take 1 tablet (750 mg total) by mouth daily with breakfast. 10/31/23   Samtani, Jai-Gurmukh, MD  OXYGEN  Inhale 4-5 L/min into the lungs as needed (for COPD-related symptoms).    [provider]  thiamine  (VITAMIN B-1) 100 MG tablet Take 1 tablet (100 mg total) by mouth daily. 11/14/23   Ghimire, Estil Heman, MD  umeclidinium bromide  (INCRUSE ELLIPTA ) 62.5 MCG/ACT AEPB Inhale 1 puff into the lungs daily. 11/14/23   Ghimire, Estil Heman, MD  zonisamide  (ZONEGRAN ) 100 MG capsule Take 2 capsules (200 mg total) by mouth daily. 10/31/23   Samtani, Jai-Gurmukh, MD      Allergies    Iron  dextran and Aspirin     Review of Systems   Review of Systems  Musculoskeletal:  Positive for back pain.    Physical Exam Updated Vital Signs BP (!) 134/98 (BP Location: Right Arm)   Pulse (!) 113   Temp 98.5 F (36.9 C) (Oral)   Resp 18   Ht 5\' 2"  (1.575 m)   Wt 58 kg   LMP 01/08/2011   SpO2 96%   BMI 23.39 kg/m  Physical Exam Vitals and nursing White reviewed.  Constitutional:      General: She is not in acute distress.    Appearance: She is not ill-appearing or toxic-appearing.  HENT:     Head: Normocephalic and atraumatic.     Mouth/Throat:  Mouth: Mucous membranes are moist.  Eyes:     General: No scleral icterus.       Right eye: No discharge.        Left eye: No discharge.     Conjunctiva/sclera: Conjunctivae normal.  Cardiovascular:     Rate and Rhythm: Regular rhythm. Tachycardia present.     Pulses: Normal pulses.     Heart sounds: Normal heart sounds. No murmur heard. Pulmonary:     Effort: Pulmonary effort is normal. No respiratory distress.     Breath sounds: Normal breath sounds. No wheezing, rhonchi or rales.  Abdominal:     General: Abdomen is flat. Bowel sounds are normal. There is no distension.     Palpations: Abdomen is  soft. There is no mass.     Tenderness: There is abdominal tenderness.     Comments: RUQ and LUQ tenderness to palpation  Musculoskeletal:     Right lower leg: No edema.     Left lower leg: No edema.     Comments: Left lower thoracic paraspinal muscle tenderness to palpation. No midline tenderness.   Skin:    General: Skin is warm and dry.     Findings: No rash.  Neurological:     General: No focal deficit present.     Mental Status: She is alert and oriented to person, place, and time. Mental status is at baseline.     Comments: 5/5 BL LE strength intact. Sensation to light touch intact. No saddle anesthesia.   Psychiatric:        Mood and Affect: Mood normal.     ED Results / Procedures / Treatments   Labs (all labs ordered are listed, but only abnormal results are displayed) Labs Reviewed  COMPREHENSIVE METABOLIC PANEL WITH GFR - Abnormal; Notable for the following components:      Result Value   Sodium 133 (*)    Glucose, Bld 142 (*)    Calcium  8.8 (*)    All other components within normal limits  CBC WITH DIFFERENTIAL/PLATELET - Abnormal; Notable for the following components:   RBC 3.72 (*)    Hemoglobin 8.1 (*)    HCT 27.6 (*)    MCV 74.2 (*)    MCH 21.8 (*)    MCHC 29.3 (*)    RDW 23.0 (*)    Lymphs Abs 0.4 (*)    All other components within normal limits  URINALYSIS, W/ REFLEX TO CULTURE (INFECTION SUSPECTED) - Abnormal; Notable for the following components:   APPearance HAZY (*)    Bacteria, UA RARE (*)    All other components within normal limits  I-STAT CHEM 8, ED - Abnormal; Notable for the following components:   BUN 5 (*)    Glucose, Bld 143 (*)    Calcium , Ion 1.11 (*)    Hemoglobin 9.9 (*)    HCT 29.0 (*)    All other components within normal limits  LIPASE, BLOOD    EKG None  Radiology CT ABDOMEN PELVIS W CONTRAST Result Date: 11/16/2023 CLINICAL DATA:  Pain. EXAM: CT ANGIOGRAPHY CHEST CT ABDOMEN AND PELVIS WITH CONTRAST TECHNIQUE:  Multidetector CT imaging of the chest was performed using the standard protocol during bolus administration of intravenous contrast. Multiplanar CT image reconstructions and MIPs were obtained to evaluate the vascular anatomy. Multidetector CT imaging of the abdomen and pelvis was performed using the standard protocol during bolus administration of intravenous contrast. RADIATION DOSE REDUCTION: This exam was performed according to the departmental dose-optimization program  which includes automated exposure control, adjustment of the mA and/or kV according to patient size and/or use of iterative reconstruction technique. CONTRAST:  75mL OMNIPAQUE  IOHEXOL  350 MG/ML SOLN COMPARISON:  06/28/2023. FINDINGS: CTA CHEST FINDINGS Cardiovascular: Satisfactory opacification of the pulmonary arteries to the segmental level. No evidence of pulmonary embolism. Normal heart size. No pericardial effusion. Mediastinum/Nodes: Bilateral thyroid  nodules. This can be evaluated with ultrasound. No suspicious mediastinal, hilar or axillary adenopathy. Lungs/Pleura: Dependent bibasilar subsegmental atelectasis. No pleural or pericardial effusions. Lungs otherwise clear. Musculoskeletal: Sclerotic changes and compression deformities of T4, T6 and T9 are stable findings. Review of the MIP images confirms the above findings. CT ABDOMEN and PELVIS FINDINGS Hepatobiliary: No focal liver abnormality is seen. No gallstones, gallbladder wall thickening, or biliary dilatation. Pancreas: Unremarkable. No pancreatic ductal dilatation or surrounding inflammatory changes. Spleen: Normal in size without focal abnormality. Adrenals/Urinary Tract: Adrenal glands are unremarkable. Kidneys are normal, without renal calculi, focal lesion, or hydronephrosis. Bladder is not well visualized due to artifact from a right hip prosthesis. Stomach/Bowel: Postop changes in the stomach. Small hiatal hernia. Appendix appears normal. No evidence of bowel wall  thickening, distention, or inflammatory changes. Vascular/Lymphatic: Aortic atherosclerosis. No enlarged abdominal or pelvic lymph nodes. Reproductive: Status post hysterectomy. No adnexal masses. 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 Musculoskeletal: Right hip prosthesis. Thoracolumbar degenerative changes. Review of the MIP images confirms the above findings. IMPRESSION: 1. No evidence of PE. 2. Bilateral thyroid  nodules. This can be evaluated with ultrasound. 3. No acute abdominal or pelvic pathology identified. 4. Aortic atherosclerosis (ICD10-I70.0). Electronically Signed   By: Sydell Eva M.D.   On: 11/16/2023 10:24   CT Angio Chest PE W/Cm &/Or Wo Cm Result Date: 11/16/2023 CLINICAL DATA:  Pain. EXAM: CT ANGIOGRAPHY CHEST CT ABDOMEN AND PELVIS WITH CONTRAST TECHNIQUE: Multidetector CT imaging of the chest was performed using the standard protocol during bolus administration of intravenous contrast. Multiplanar CT image reconstructions and MIPs were obtained to evaluate the vascular anatomy. Multidetector CT imaging of the abdomen and pelvis was performed using the standard protocol during bolus administration of intravenous contrast. RADIATION DOSE REDUCTION: This exam was performed according to the departmental dose-optimization program which includes automated exposure control, adjustment of the mA and/or kV according to patient size and/or use of iterative reconstruction technique. CONTRAST:  75mL OMNIPAQUE  IOHEXOL  350 MG/ML SOLN COMPARISON:  06/28/2023. FINDINGS: CTA CHEST FINDINGS Cardiovascular: Satisfactory opacification of the pulmonary arteries to the segmental level. No evidence of pulmonary embolism. Normal heart size. No pericardial effusion. Mediastinum/Nodes: Bilateral thyroid  nodules. This can be evaluated with ultrasound. No suspicious mediastinal, hilar or axillary adenopathy. Lungs/Pleura: Dependent  bibasilar subsegmental atelectasis. No pleural or pericardial effusions. Lungs otherwise clear. Musculoskeletal: Sclerotic changes and compression deformities of T4, T6 and T9 are stable findings. Review of the MIP images confirms the above findings. CT ABDOMEN and PELVIS FINDINGS Hepatobiliary: No focal liver abnormality is seen. No gallstones, gallbladder wall thickening, or biliary dilatation. Pancreas: Unremarkable. No pancreatic ductal dilatation or surrounding inflammatory changes. Spleen: Normal in size without focal abnormality. Adrenals/Urinary Tract: Adrenal glands are unremarkable. Kidneys are normal, without renal calculi, focal lesion, or hydronephrosis. Bladder is not well visualized due to artifact from a right hip prosthesis. Stomach/Bowel: Postop changes in the stomach. Small hiatal hernia. Appendix appears normal. No evidence of bowel wall thickening, distention, or inflammatory changes. Vascular/Lymphatic: Aortic atherosclerosis. No enlarged abdominal or pelvic lymph nodes. Reproductive: Status post hysterectomy. No  adnexal masses. 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 Musculoskeletal: Right hip prosthesis. Thoracolumbar degenerative changes. Review of the MIP images confirms the above findings. IMPRESSION: 1. No evidence of PE. 2. Bilateral thyroid  nodules. This can be evaluated with ultrasound. 3. No acute abdominal or pelvic pathology identified. 4. Aortic atherosclerosis (ICD10-I70.0). Electronically Signed   By: Sydell Eva M.D.   On: 11/16/2023 10:24   US  Abdomen Limited RUQ (LIVER/GB) Result Date: 11/16/2023 CLINICAL DATA:  151471 RUQ pain 151471 EXAM: ULTRASOUND ABDOMEN LIMITED COMPARISON:  None Available. FINDINGS: The liver demonstrates normal parenchymal echogenicity and homogeneous texture without focal hepatic parenchymal lesions or intrahepatic ductal dilatation. Hepatopetal portal vein flow.  The gallbladder demonstrates no stones, wall thickening or pericholecystic fluid. CBD measured 0.5cm. IMPRESSION: Unremarkable examination of the right upper quadrant. Electronically Signed   By: Sydell Eva M.D.   On: 11/16/2023 08:22    Procedures Procedures    Medications Ordered in ED Medications  iohexol  (OMNIPAQUE ) 350 MG/ML injection 75 mL (has no administration in time range)  sodium chloride  0.9 % bolus 500 mL (has no administration in time range)  thiamine  (VITAMIN B1) tablet 100 mg (has no administration in time range)    Or  thiamine  (VITAMIN B1) injection 100 mg (has no administration in time range)  folic acid  (FOLVITE ) tablet 1 mg (has no administration in time range)  albuterol  (VENTOLIN  HFA) 108 (90 Base) MCG/ACT inhaler 1 puff (has no administration in time range)  sodium chloride  0.9 % bolus 500 mL (500 mLs Intravenous New Bag/Given 11/16/23 1018)  LORazepam  (ATIVAN ) injection 1 mg (1 mg Intravenous Given 11/16/23 1009)  iohexol  (OMNIPAQUE ) 350 MG/ML injection 75 mL (75 mLs Intravenous Contrast Given 11/16/23 1610)    ED Course/ Medical Decision Making/ A&P                                 Medical Decision Making Amount and/or Complexity of Data Reviewed Labs: ordered. Radiology: ordered.  Risk OTC drugs. Prescription drug management.   This patient presents to the ED for concern of back pain, this involves an extensive number of treatment options, and is a complaint that carries with it a high risk of complications and morbidity.  The differential diagnosis includes pyelonephritis, nephrolithiasis, spinal abscess, osteomyelitis, herniated disc, muscle strain, spinal fracture, meningitis, cancer, cauda equina syndrome.   Co morbidities that complicate the patient evaluation  schizophrenia, chronic back pain, COPD, DM, diabetic neuropathy, GERD, headaches, HTN, HLD, hypothyroidism,   Additional history obtained:  No PCP listed in chart. Will refer to  community clinic. 11/14/2023 Discharge summary -> Left UE DVT: Very difficult situation-noncompliant with current medications-known history of drug/cocaine use-not ideal long-term anticoagulation candidate.  Discussed with daughter Basil Boston 5/22-she understands difficult situation-we both agree that we will continue anticoagulation while she is inpatient-and on discharge not place her on anticoagulation-she is likely not going to take the medication in the first place.    Problem List / ED Course / Critical interventions / Medication management  Patient presents to ED concerned for thoracic back pain, LUQ pain, RUQ pain since waking up this morning.  Physical exam reassuring.  Patient does have tachycardia in the 110s which appears to be due to multiple albuterol  doses recently along with probable dehydration from EtOH use disorder.  Patient otherwise afebrile with stable vitals. I Ordered, and personally interpreted labs.  CMP with mild hyponatremia 133.  Lipase within normal limits.  CBC without leukocytosis.  There is anemia near patient's baseline with hemoglobin 8.1 today.  UA not concerning for infection. I ordered imaging studies including CT abdomen/pelvis, CTA chest, RUQ ultrasound. I independently visualized and interpreted imaging which showed no acute process. I agree with the radiologist interpretation Provided patient with IV fluids, thiamine , folic acid , one dose of 1mg  Ativan . I was able to talk with patient's legal guardian [name] and shared all results and answered all questions. [name] stating that patient needs a new albuterol  inhaler because she does not know how to properly use it and uses too much of it frequently - I have ordered this for the patient. I recommended following up with PCP. Patient and legal guardian verbalized understanding of plan.  Patient is well appearing and ambulating in ED. I doubt she is having withdrawals currently as she was very recently without ETOH  for 10 days while she was admitted in the hospital and just started drinking again yesterday. Per daughter's story - it seems that patient's tachycardia is d/t her frequent albuterol  doses. Patient is tolerating PO well in ED and agrees to stay hydrated and follow up with PCP.  Staffed with Dr. Zackowski who agrees with plan. I have reviewed the patients home medicines and have made adjustments as needed The patient has been appropriately medically screened and/or stabilized in the ED. I have low suspicion for any other emergent medical condition which would require further screening, evaluation or treatment in the ED or require inpatient management. At time of discharge the patient is hemodynamically stable and in no acute distress. I have discussed work-up results and diagnosis with patient and answered all questions. Patient is agreeable with discharge plan. We discussed strict return precautions for returning to the emergency department and they verbalized understanding.    Social Determinants of Health:  none         Final Clinical Impression(s) / ED Diagnoses Final diagnoses:  Back strain, initial encounter  LUQ pain  RUQ pain    Rx / DC Orders ED Discharge Orders     None         [place], [place] 11/16/23 1210    Nicklas Barns, MD 11/18/23 [phone]

## 2023-11-16 NOTE — ED Notes (Signed)
 Patient ambulated to the bathroom with a steady gait with no assistance

## 2023-11-16 NOTE — Progress Notes (Signed)
 Transition of Care [facility]) - Emergency Department Mini Assessment   Patient Details  Name: [name] MRN: [id number] Date of Birth: [birth year]  [facility]) CM/SW Contact:    Joanette Moynahan, RN Phone Number: 11/16/2023, 12:17 PM   Clinical Narrative: RNCM spoke with pt at bedside.  Pt requests her daughter be alerted that she is in hospital.  RNCM contacted pt daughter who requests EDP call her prior to discharging pt.  RNCM will relay information to EDP.   ED Mini Assessment: What brought you to the Emergency Department? : (P) back pain  Barriers to Discharge: (P) Continued Medical Work up     [employer] of departure: (P) Car       Patient Mudlogger with: (P) [name]  ,          Patient states their goals for this hospitalization and ongoing recovery are:: (P) "stop my back from hurting, get another inhaler and go home."      Admission diagnosis:  back pain Patient Active Problem List   Diagnosis Date Noted   History of syphilis 11/09/2023   Ground-level fall 10/30/2023   Polysubstance use disorder 10/30/2023   Encounter for attention to tracheostomy (HCC) 05/02/2023   Type 2 diabetes mellitus with diabetic polyneuropathy, with long-term current use of insulin  (HCC) 05/02/2023   Behavior concern in adult 05/02/2023   MDD (major depressive disorder) 04/29/2023   Acute on chronic anemia 03/13/2023   B12 deficiency 03/13/2023   Hyponatremia 02/22/2023   Chronic alcohol  use 02/22/2023   Continuous dependence on cigarette smoking 02/22/2023   Alcohol  use disorder, severe, dependence (HCC) 01/03/2023   Suicidal ideation 01/03/2023   Abnormal weight loss 01/02/2023   Nausea and vomiting 01/02/2023   Hyperglycemia due to type 2 diabetes mellitus (HCC) 01/02/2023   History of colonic polyps 01/02/2023   Acute on chronic respiratory failure with hypoxia (HCC) 12/11/2022   Pulmonary nodules 12/11/2022   Type 2 diabetes  mellitus without complications (HCC) 12/11/2022   Malnutrition of moderate degree 11/22/2022   Withdrawal symptoms, alcohol  (HCC) 11/20/2022   CAP (community acquired pneumonia) 11/20/2022   Closed fracture of right zygomaticomaxillary complex (HCC) 08/26/2022   Blunt trauma 08/26/2022   Alcohol  withdrawal (HCC) 08/25/2022   Seizure-like activity (HCC) 08/23/2022   Cocaine abuse (HCC) 08/23/2022   Closed fracture of left proximal humerus 07/08/2022   Homicidal ideation 07/07/2022   Aggression aggravated 07/05/2022   Hyperbilirubinemia 06/16/2022   Pressure injury of skin 06/16/2022   Pulmonary embolism (HCC) 05/19/2022   Postoperative wound infection 05/04/2022   Thrombocytosis 05/04/2022   Seizure (HCC) 04/17/2022   Memory loss 04/17/2022   SOB (shortness of breath) 04/05/2022   Abnormal EEG 02/12/2022   Constipation 02/12/2022   Vitamin A  deficiency 02/12/2022   Vitamin D  deficiency 02/12/2022   Vitamin C deficiency 02/12/2022   Iron  deficiency anemia 02/12/2022   GERD (gastroesophageal reflux disease) 02/12/2022   Tobacco abuse 02/12/2022   Protein-calorie malnutrition, severe 01/14/2022   Acute respiratory failure with hypoxia and hypercapnia (HCC) 11/14/2021   Microcytic anemia 11/14/2021   Alcohol  abuse with intoxication (HCC) 11/12/2021   Dysphagia 05/28/2021   Nasal septal perforation 05/28/2021   Referred otalgia of left ear 05/28/2021   Oral pain 05/28/2021   Acute encephalopathy 10/10/2020   Polysubstance (excluding opioids) dependence, daily use (HCC) 08/04/2018   Substance induced mood disorder (HCC) 08/03/2018   Schizoaffective disorder (HCC) 11/13/2017   Polysubstance abuse (HCC) 11/13/2017  Dehydration 04/17/2016   Type 2 diabetes mellitus (HCC) 09/06/2015   Right knee pain 01/05/2015   Muscular deconditioning 09/08/2014   Primary insomnia 09/08/2014   Encounter for monitoring opioid maintenance therapy 07/10/2014   Myofascial pain syndrome 12/09/2013    DDD (degenerative disc disease), lumbosacral 08/12/2013   Low back pain 08/12/2013   Lumbosacral spondylosis 04/13/2013   Primary localized osteoarthrosis, lower leg 12/14/2012   Chronic pain 11/27/2012   Hypothyroidism 11/27/2012   Sacroiliitis (HCC) 11/27/2012   Pain in joint involving lower leg 11/27/2012   Essential hypertension 11/04/2011   Paroxysmal atrial fibrillation with RVR (HCC) 09/30/2011   COPD (chronic obstructive pulmonary disease) (HCC) 09/29/2011   PCP:  Patient, No Pcp Per Pharmacy:   [id number] - [place], [place] - [address] [place] [address] Phone: [phone] Fax: [phone]  [id number] [place], [place] - [address] [place] [address] Phone: [phone] Fax: [phone]

## 2023-11-16 NOTE — ED Triage Notes (Signed)
 Pt to ED via [facility] from home. Pt c/o back pain, denies injury, states she woke up with pain.  Pt to 8 puffs of albuterol  and 3 doxycycline  prior to EMS arrival.  RR 16 134/74 94% RA HR 125 Cbg 174

## 2023-11-18 ENCOUNTER — Encounter (HOSPITAL_COMMUNITY): Payer: Self-pay

## 2023-11-18 ENCOUNTER — Emergency Department (HOSPITAL_COMMUNITY)
Admission: EM | Admit: 2023-11-18 | Discharge: 2023-11-18 | Disposition: A | Discharge status: Home / Self Care | Attending: Student | Admitting: Student

## 2023-11-18 ENCOUNTER — Other Ambulatory Visit: Payer: Self-pay

## 2023-11-18 ENCOUNTER — Emergency Department (HOSPITAL_COMMUNITY)

## 2023-11-18 DIAGNOSIS — E1165 Type 2 diabetes mellitus with hyperglycemia: Secondary | ICD-10-CM | POA: Diagnosis not present

## 2023-11-18 DIAGNOSIS — R0600 Dyspnea, unspecified: Secondary | ICD-10-CM | POA: Diagnosis present

## 2023-11-18 DIAGNOSIS — F17210 Nicotine dependence, cigarettes, uncomplicated: Secondary | ICD-10-CM | POA: Diagnosis not present

## 2023-11-18 DIAGNOSIS — Z7984 Long term (current) use of oral hypoglycemic drugs: Secondary | ICD-10-CM | POA: Insufficient documentation

## 2023-11-18 DIAGNOSIS — J449 Chronic obstructive pulmonary disease, unspecified: Secondary | ICD-10-CM | POA: Diagnosis not present

## 2023-11-18 DIAGNOSIS — I10 Essential (primary) hypertension: Secondary | ICD-10-CM | POA: Diagnosis not present

## 2023-11-18 DIAGNOSIS — E039 Hypothyroidism, unspecified: Secondary | ICD-10-CM | POA: Insufficient documentation

## 2023-11-18 DIAGNOSIS — D649 Anemia, unspecified: Secondary | ICD-10-CM | POA: Insufficient documentation

## 2023-11-18 DIAGNOSIS — R000 Tachycardia, unspecified: Secondary | ICD-10-CM | POA: Insufficient documentation

## 2023-11-18 DIAGNOSIS — Z79899 Other long term (current) drug therapy: Secondary | ICD-10-CM | POA: Insufficient documentation

## 2023-11-18 LAB — CBC WITH DIFFERENTIAL/PLATELET
Abs Immature Granulocytes: 0.2 10*3/uL — ABNORMAL HIGH (ref 0.00–0.07)
Band Neutrophils: 1 %
Basophils Absolute: 0.1 10*3/uL (ref 0.0–0.1)
Basophils Relative: 1 %
Eosinophils Absolute: 0.1 10*3/uL (ref 0.0–0.5)
Eosinophils Relative: 1 %
HCT: 25.3 % — ABNORMAL LOW (ref 36.0–46.0)
Hemoglobin: 7.3 g/dL — ABNORMAL LOW (ref 12.0–15.0)
Lymphocytes Relative: 15 %
Lymphs Abs: 1.2 10*3/uL (ref 0.7–4.0)
MCH: 21.9 pg — ABNORMAL LOW (ref 26.0–34.0)
MCHC: 28.9 g/dL — ABNORMAL LOW (ref 30.0–36.0)
MCV: 76 fL — ABNORMAL LOW (ref 80.0–100.0)
Metamyelocytes Relative: 2 %
Monocytes Absolute: 0.2 10*3/uL (ref 0.1–1.0)
Monocytes Relative: 2 %
Neutro Abs: 6.2 10*3/uL (ref 1.7–7.7)
Neutrophils Relative %: 78 %
Platelets: 373 10*3/uL (ref 150–400)
RBC: 3.33 MIL/uL — ABNORMAL LOW (ref 3.87–5.11)
RDW: 23 % — ABNORMAL HIGH (ref 11.5–15.5)
WBC: 7.8 10*3/uL (ref 4.0–10.5)
nRBC: 0 % (ref 0.0–0.2)
nRBC: 0 /100{WBCs}

## 2023-11-18 LAB — COMPREHENSIVE METABOLIC PANEL WITH GFR
ALT: 19 U/L (ref 0–44)
AST: 27 U/L (ref 15–41)
Albumin: 3.2 g/dL — ABNORMAL LOW (ref 3.5–5.0)
Alkaline Phosphatase: 67 U/L (ref 38–126)
Anion gap: 7 (ref 5–15)
BUN: 5 mg/dL — ABNORMAL LOW (ref 6–20)
CO2: 22 mmol/L (ref 22–32)
Calcium: 8.1 mg/dL — ABNORMAL LOW (ref 8.9–10.3)
Chloride: 103 mmol/L (ref 98–111)
Creatinine, Ser: 0.57 mg/dL (ref 0.44–1.00)
GFR, Estimated: >60 mL/min (ref >60)
Glucose, Bld: 216 mg/dL — ABNORMAL HIGH (ref 70–99)
Potassium: 3.5 mmol/L (ref 3.5–5.1)
Sodium: 132 mmol/L — ABNORMAL LOW (ref 135–145)
Total Bilirubin: 0.4 mg/dL (ref 0.0–1.2)
Total Protein: 6.4 g/dL — ABNORMAL LOW (ref 6.5–8.1)

## 2023-11-18 MED ORDER — LORAZEPAM 1 MG PO TABS
2.0000 mg | ORAL_TABLET | Freq: Once | ORAL | Status: AC
  Administered 2023-11-18: 2 mg via ORAL
  Filled 2023-11-18: qty 2

## 2023-11-18 MED ORDER — ALBUTEROL SULFATE HFA 108 (90 BASE) MCG/ACT IN AERS
1.0000 | INHALATION_SPRAY | Freq: Once | RESPIRATORY_TRACT | Status: AC
  Administered 2023-11-18: 1 via RESPIRATORY_TRACT
  Filled 2023-11-18: qty 6.7

## 2023-11-18 MED ORDER — LACTATED RINGERS IV BOLUS
1000.0000 mL | Freq: Once | INTRAVENOUS | Status: AC
  Administered 2023-11-18: 1000 mL via INTRAVENOUS

## 2023-11-18 MED ORDER — LORAZEPAM 1 MG PO TABS
1.0000 mg | ORAL_TABLET | Freq: Once | ORAL | Status: AC
  Administered 2023-11-18: 1 mg via ORAL
  Filled 2023-11-18: qty 1

## 2023-11-18 NOTE — ED Provider Notes (Signed)
 [place] EMERGENCY DEPARTMENT AT [facility] Provider Note  CSN: [id number] Arrival date & time: 11/18/23 0201  Chief Complaint(s) Shortness of Breath  HPI [name] is a 58 y.o. female with PMH COPD, T2DM, HTN, HLD, alcohol  abuse,  chronic back pain, frequent ER visits who presents emerged part for evaluation of shortness of breath.  States that this morning she started to get wheezing and shortness of breath.  Received 1 duo by EMS prior to arrival and on presentation emergency room today she is asymptomatic stating that her pain has resolved and her shortness of breath has resolved.  She does not have a home albuterol  inhaler.  Currently denies chest pain, shortness of breath, abdominal pain, nausea, vomiting or other systemic symptoms.   Past Medical History Past Medical History:  Diagnosis Date   Abdominal pain    Accidental drug overdose April 2013   Anxiety    Atrial fibrillation (HCC) 09/29/11   converted spontaneously   Chronic back pain    Chronic knee pain    Chronic nausea    Chronic pain    COPD (chronic obstructive pulmonary disease) (HCC)    Depression    Diabetes mellitus    states her doctor took her off all DM meds in past month   Diabetic neuropathy (HCC)    Dyspnea    with exertion    GERD (gastroesophageal reflux disease)    Headache(784.0)    migraines    HTN (hypertension)    not on meds since in a year    Hyperlipidemia    Hypothyroidism    not on meds in a while    Mental disorder    Bipolar and schizophrenic   Nausea and vomiting 01/02/2023   Requires supplemental oxygen     as needed per patient    Schizophrenia (HCC)    Schizophrenia, acute (HCC) 11/13/2017   Tobacco abuse    Patient Active Problem List   Diagnosis Date Noted   History of syphilis 11/09/2023   Ground-level fall 10/30/2023   Polysubstance use disorder 10/30/2023   Encounter for attention to tracheostomy (HCC) 05/02/2023   Type 2 diabetes mellitus with  diabetic polyneuropathy, with long-term current use of insulin  (HCC) 05/02/2023   Behavior concern in adult 05/02/2023   MDD (major depressive disorder) 04/29/2023   Acute on chronic anemia 03/13/2023   B12 deficiency 03/13/2023   Hyponatremia 02/22/2023   Chronic alcohol  use 02/22/2023   Continuous dependence on cigarette smoking 02/22/2023   Alcohol  use disorder, severe, dependence (HCC) 01/03/2023   Suicidal ideation 01/03/2023   Abnormal weight loss 01/02/2023   Nausea and vomiting 01/02/2023   Hyperglycemia due to type 2 diabetes mellitus (HCC) 01/02/2023   History of colonic polyps 01/02/2023   Acute on chronic respiratory failure with hypoxia (HCC) 12/11/2022   Pulmonary nodules 12/11/2022   Type 2 diabetes mellitus without complications (HCC) 12/11/2022   Malnutrition of moderate degree 11/22/2022   Withdrawal symptoms, alcohol  (HCC) 11/20/2022   CAP (community acquired pneumonia) 11/20/2022   Closed fracture of right zygomaticomaxillary complex (HCC) 08/26/2022   Blunt trauma 08/26/2022   Alcohol  withdrawal (HCC) 08/25/2022   Seizure-like activity (HCC) 08/23/2022   Cocaine abuse (HCC) 08/23/2022   Closed fracture of left proximal humerus 07/08/2022   Homicidal ideation 07/07/2022   Aggression aggravated 07/05/2022   Hyperbilirubinemia 06/16/2022   Pressure injury of skin 06/16/2022   Pulmonary embolism (HCC) 05/19/2022   Postoperative wound infection 05/04/2022   Thrombocytosis 05/04/2022   Seizure (  HCC) 04/17/2022   Memory loss 04/17/2022   SOB (shortness of breath) 04/05/2022   Abnormal EEG 02/12/2022   Constipation 02/12/2022   Vitamin A  deficiency 02/12/2022   Vitamin D  deficiency 02/12/2022   Vitamin C deficiency 02/12/2022   Iron  deficiency anemia 02/12/2022   GERD (gastroesophageal reflux disease) 02/12/2022   Tobacco abuse 02/12/2022   Protein-calorie malnutrition, severe 01/14/2022   Acute respiratory failure with hypoxia and hypercapnia (HCC)  11/14/2021   Microcytic anemia 11/14/2021   Alcohol  abuse with intoxication (HCC) 11/12/2021   Dysphagia 05/28/2021   Nasal septal perforation 05/28/2021   Referred otalgia of left ear 05/28/2021   Oral pain 05/28/2021   Acute encephalopathy 10/10/2020   Polysubstance (excluding opioids) dependence, daily use (HCC) 08/04/2018   Substance induced mood disorder (HCC) 08/03/2018   Schizoaffective disorder (HCC) 11/13/2017   Polysubstance abuse (HCC) 11/13/2017   Dehydration 04/17/2016   Type 2 diabetes mellitus (HCC) 09/06/2015   Right knee pain 01/05/2015   Muscular deconditioning 09/08/2014   Primary insomnia 09/08/2014   Encounter for monitoring opioid maintenance therapy 07/10/2014   Myofascial pain syndrome 12/09/2013   DDD (degenerative disc disease), lumbosacral 08/12/2013   Low back pain 08/12/2013   Lumbosacral spondylosis 04/13/2013   Primary localized osteoarthrosis, lower leg 12/14/2012   Chronic pain 11/27/2012   Hypothyroidism 11/27/2012   Sacroiliitis (HCC) 11/27/2012   Pain in joint involving lower leg 11/27/2012   Essential hypertension 11/04/2011   Paroxysmal atrial fibrillation with RVR (HCC) 09/30/2011   COPD (chronic obstructive pulmonary disease) (HCC) 09/29/2011   Home Medication(s) Prior to Admission medications   Medication Sig Start Date End Date Taking? Authorizing Provider  albuterol  (VENTOLIN  HFA) 108 (90 Base) MCG/ACT inhaler Inhale 1-2 puffs into the lungs every 6 (six) hours as needed for wheezing or shortness of breath. 10/29/23   Palumbo, April, MD  ARIPiprazole  (ABILIFY ) 10 MG tablet Take 1 tablet (10 mg total) by mouth daily. 11/01/23   Samtani, Jai-Gurmukh, MD  divalproex  (DEPAKOTE  SPRINKLE) 125 MG capsule Take 2 capsules (250 mg total) by mouth every 12 (twelve) hours. 11/14/23   Ghimire, Estil Heman, MD  fluticasone  furoate-vilanterol (BREO ELLIPTA ) 100-25 MCG/ACT AEPB Inhale 1 puff into the lungs daily. 11/14/23   Ghimire, Estil Heman, MD   ipratropium-albuterol  (DUONEB) 0.5-2.5 (3) MG/3ML SOLN Take 3 mLs by nebulization every 6 (six) hours as needed (for shortness of breath or wheezing).    [provider]  levETIRAcetam  (KEPPRA  XR) 500 MG 24 hr tablet Take 1 tablet (500 mg total) by mouth 2 (two) times daily. 10/31/23   Samtani, Jai-Gurmukh, MD  metFORMIN  (GLUCOPHAGE -XR) 750 MG 24 hr tablet Take 1 tablet (750 mg total) by mouth daily with breakfast. 10/31/23   Samtani, Jai-Gurmukh, MD  OXYGEN  Inhale 4-5 L/min into the lungs as needed (for COPD-related symptoms).    [provider]  thiamine  (VITAMIN B-1) 100 MG tablet Take 1 tablet (100 mg total) by mouth daily. 11/14/23   Ghimire, Estil Heman, MD  umeclidinium bromide  (INCRUSE ELLIPTA ) 62.5 MCG/ACT AEPB Inhale 1 puff into the lungs daily. 11/14/23   Ghimire, Estil Heman, MD  zonisamide  (ZONEGRAN ) 100 MG capsule Take 2 capsules (200 mg total) by mouth daily. 10/31/23   Samtani, Jai-Gurmukh, MD  Past Surgical History Past Surgical History:  Procedure Laterality Date   ABDOMINAL HYSTERECTOMY     BLADDER SUSPENSION  03/04/2011   Procedure: [facility] PROCEDURE;  Surgeon: Jackelyn Marvel MacDiarmid;  Location: [facility] ORS;  Service: Urology;  Laterality: N/A;   BOWEL RESECTION N/A 04/18/2022   Procedure: SMALL BOWEL RESECTION;  Surgeon: Sim Dryer, MD;  Location: [facility] OR;  Service: General;  Laterality: N/A;   CYSTOCELE REPAIR  03/04/2011   Procedure: ANTERIOR REPAIR (CYSTOCELE);  Surgeon: Devorah Fonder;  Location: [facility] ORS;  Service: Urology;  Laterality: N/A;   CYSTOSCOPY  03/04/2011   Procedure: CYSTOSCOPY;  Surgeon: Jackelyn Marvel MacDiarmid;  Location: [facility] ORS;  Service: Urology;  Laterality: N/A;   ESOPHAGOGASTRODUODENOSCOPY (EGD) WITH PROPOFOL  N/A 05/12/2017   Procedure: ESOPHAGOGASTRODUODENOSCOPY (EGD) WITH PROPOFOL ;  Surgeon: Juanita Norlander, MD;  Location: [facility];  Service: General;  Laterality: N/A;   GASTRIC ROUX-EN-Y N/A 03/25/2016   Procedure: LAPAROSCOPIC ROUX-EN-Y GASTRIC BYPASS WITH UPPER ENDOSCOPY;  Surgeon: Ayesha Lente, MD;  Location: [facility] ORS;  Service: General;  Laterality: N/A;   KNEE SURGERY     LAPAROSCOPIC ASSISTED VAGINAL HYSTERECTOMY  03/04/2011   Procedure: LAPAROSCOPIC ASSISTED VAGINAL HYSTERECTOMY;  Surgeon: Martine Sleek, MD;  Location: [facility] ORS;  Service: Gynecology;  Laterality: N/A;   LAPAROTOMY N/A 04/18/2022   Procedure: EXPLORATORY LAPAROTOMY;  Surgeon: Sim Dryer, MD;  Location: [facility] OR;  Service: General;  Laterality: N/A;   LAPAROTOMY N/A 04/24/2022   Procedure: BRING BACK EXPLORATORY LAPAROTOMY;  Surgeon: Dorena Gander, MD;  Location: [facility] OR;  Service: General;  Laterality: N/A;   TOTAL HIP ARTHROPLASTY Right 08/27/2022   Procedure: TOTAL HIP ARTHROPLASTY;  Surgeon: Murleen Arms, MD;  Location: [facility] OR;  Service: Orthopedics;  Laterality: Right;   Family History Family History  Problem Relation Age of Onset   Heart attack Father        78s   Diabetes Mother    Heart disease Mother    Hypertension Mother    Heart attack Sister        80   COPD Other    Breast cancer Neg Hx     Social History Social History   Tobacco Use   Smoking status: Every Day    Current packs/day: 1.00    Types: Cigarettes   Smokeless tobacco: Never  Vaping Use   Vaping status: Never Used  Substance Use Topics   Alcohol  use: Yes    Comment: 40 ounces daily   Drug use: Yes    Types: Cocaine, Marijuana, "Crack" cocaine    Comment: last use- 03/19/2023   Allergies Iron  dextran and Aspirin   Review of Systems Review of Systems  Respiratory:  Positive for shortness of breath and wheezing.     Physical Exam Vital Signs  I have reviewed the triage vital signs BP 109/63   Pulse (!) 120   Temp 98 F (36.7 C) (Oral)   Resp (!) 21   Ht 5\' 2"  (1.575 m)   Wt 58 kg   LMP 01/08/2011   SpO2 100%   BMI 23.39  kg/m   Physical Exam Vitals and nursing note reviewed.  Constitutional:      General: She is not in acute distress.    Appearance: She is well-developed.  HENT:     Head: Normocephalic and atraumatic.  Eyes:     Conjunctiva/sclera: Conjunctivae normal.  Cardiovascular:     Rate and Rhythm: Regular rhythm. Tachycardia present.     Heart sounds: No murmur  heard. Pulmonary:     Effort: Pulmonary effort is normal. No respiratory distress.     Breath sounds: Normal breath sounds.  Abdominal:     Palpations: Abdomen is soft.     Tenderness: There is no abdominal tenderness.  Musculoskeletal:        General: No swelling.     Cervical back: Neck supple.  Skin:    General: Skin is warm and dry.     Capillary Refill: Capillary refill takes less than 2 seconds.  Neurological:     Mental Status: She is alert.  Psychiatric:        Mood and Affect: Mood normal.     ED Results and Treatments Labs (all labs ordered are listed, but only abnormal results are displayed) Labs Reviewed  CBC WITH DIFFERENTIAL/PLATELET - Abnormal; Notable for the following components:      Result Value   RBC 3.33 (*)    Hemoglobin 7.3 (*)    HCT 25.3 (*)    MCV 76.0 (*)    MCH 21.9 (*)    MCHC 28.9 (*)    RDW 23.0 (*)    All other components within normal limits  COMPREHENSIVE METABOLIC PANEL WITH GFR  RAPID URINE DRUG SCREEN, HOSP PERFORMED                                                                                                                          Radiology DG Chest Portable 1 View Result Date: 11/18/2023 CLINICAL DATA:  Dyspnea and shortness of breath. EXAM: PORTABLE CHEST 1 VIEW COMPARISON:  Nov 11, 2023 FINDINGS: The heart size and mediastinal contours are within normal limits. Low lung volumes are noted without evidence of an acute infiltrate, pleural effusion or pneumothorax. Multilevel degenerative changes are seen throughout the thoracic spine. IMPRESSION: Low lung volumes without  evidence of acute or active cardiopulmonary disease. Electronically Signed   By: Virgle Grime M.D.   On: 11/18/2023 02:31    Pertinent labs & imaging results that were available during my care of the patient were reviewed by me and considered in my medical decision making (see MDM for details).  Medications Ordered in ED Medications  albuterol  (VENTOLIN  HFA) 108 (90 Base) MCG/ACT inhaler 1 puff (1 puff Inhalation Given 11/18/23 0236)  LORazepam  (ATIVAN ) tablet 1 mg (1 mg Oral Given 11/18/23 0303)  LORazepam  (ATIVAN ) tablet 2 mg (2 mg Oral Given 11/18/23 0418)  lactated ringers  bolus 1,000 mL (1,000 mLs Intravenous New Bag/Given 11/18/23 0553)  Procedures Procedures  (including critical care time)  Medical Decision Making / ED Course   This patient presents to the ED for concern of shortness of breath, this involves an extensive number of treatment options, and is a complaint that carries with it a high risk of complications and morbidity.  The differential diagnosis includes Pe, PTX, Pulmonary Edema, ARDS, COPD/Asthma, ACS, CHF exacerbation, Arrhythmia, Pericardial Effusion/Tamponade, Anemia, Sepsis, Acidosis/Hypercapnia, Anxiety, Viral URI  MDM: Patient seen emergency room for evaluation of shortness of breath.  On arrival, patient is tremulous and tachycardic but there is no appreciable wheezing and she states she is feeling symptomatically improved.  She is adamant that her shaking and rapid heart rate started after receiving the albuterol  treatments by EMS and she was not shaking prior to the treatment.  Thus initially we had a plan to observe the patient and ensure that her tachycardia was resolving prior to discharge.  However, over multiple hours in the emergency department her tachycardia has not been resolving and we did an Ativan  trial in the setting of  her known alcohol  use.  This also did not improve her tachycardia.  Laboratory evaluation was then obtained showing a hemoglobin of 7.3 and additional laboratory evaluation was pending when she requested to leave the emergency department.  She states that she needs to leave and take her kids to school.  I did voice that I have concern about her dropping blood counts and persistent tachycardia and she states that she will return to the emergency department after she takes her kids to school.  She is alert and of sound mind and able to make her own medical decisions and thus she was discharged at her request prior to completion of laboratory evaluation.  She voiced understanding of the risks of leaving the emergency department including significant morbidity or death.  Patient then discharged by informed discharge   Additional history obtained:  -External records from outside source obtained and reviewed including: Chart review including previous notes, labs, imaging, consultation notes   Lab Tests: -I ordered, reviewed, and interpreted labs.   The pertinent results include:   Labs Reviewed  CBC WITH DIFFERENTIAL/PLATELET - Abnormal; Notable for the following components:      Result Value   RBC 3.33 (*)    Hemoglobin 7.3 (*)    HCT 25.3 (*)    MCV 76.0 (*)    MCH 21.9 (*)    MCHC 28.9 (*)    RDW 23.0 (*)    All other components within normal limits  COMPREHENSIVE METABOLIC PANEL WITH GFR  RAPID URINE DRUG SCREEN, HOSP PERFORMED       Imaging Studies ordered: I ordered imaging studies including chest x-ray I independently visualized and interpreted imaging. I agree with the radiologist interpretation   Medicines ordered and prescription drug management: Meds ordered this encounter  Medications   albuterol  (VENTOLIN  HFA) 108 (90 Base) MCG/ACT inhaler 1 puff   LORazepam  (ATIVAN ) tablet 1 mg   LORazepam  (ATIVAN ) tablet 2 mg   lactated ringers  bolus 1,000 mL    -I have reviewed the  patients home medicines and have made adjustments as needed  Critical interventions none    Cardiac Monitoring: The patient was maintained on a cardiac monitor.  I personally viewed and interpreted the cardiac monitored which showed an underlying rhythm of: Sinus tachycardia  Social Determinants of Health:  Factors impacting patients care include: none   Reevaluation: After the interventions noted above, I reevaluated the patient and found that  they have :stayed the same  Co morbidities that complicate the patient evaluation  Past Medical History:  Diagnosis Date   Abdominal pain    Accidental drug overdose April 2013   Anxiety    Atrial fibrillation (HCC) 09/29/11   converted spontaneously   Chronic back pain    Chronic knee pain    Chronic nausea    Chronic pain    COPD (chronic obstructive pulmonary disease) (HCC)    Depression    Diabetes mellitus    states her doctor took her off all DM meds in past month   Diabetic neuropathy (HCC)    Dyspnea    with exertion    GERD (gastroesophageal reflux disease)    Headache(784.0)    migraines    HTN (hypertension)    not on meds since in a year    Hyperlipidemia    Hypothyroidism    not on meds in a while    Mental disorder    Bipolar and schizophrenic   Nausea and vomiting 01/02/2023   Requires supplemental oxygen     as needed per patient    Schizophrenia (HCC)    Schizophrenia, acute (HCC) 11/13/2017   Tobacco abuse       Dispostion: I considered admission for this patient, and patient requested to leave the Emergency Department prior to completion of medical workup.  Very strict return precautions including return to the emergency department as soon as possible were given the patient of which she voiced understanding     Final Clinical Impression(s) / ED Diagnoses Final diagnoses:  Dyspnea, unspecified type  Tachycardia  Anemia, unspecified type     @PCDICTATION @    Karlyn Overman, MD 11/18/23  [phone]

## 2023-11-18 NOTE — ED Triage Notes (Signed)
 Pt reports she was sitting at home this morning when she started having SOA. Reports hx COPD and recently d/c from here.

## 2023-11-21 ENCOUNTER — Other Ambulatory Visit: Payer: Self-pay

## 2023-11-21 ENCOUNTER — Encounter (HOSPITAL_COMMUNITY): Payer: Self-pay

## 2023-11-21 ENCOUNTER — Emergency Department (HOSPITAL_COMMUNITY)
Admission: EM | Admit: 2023-11-21 | Discharge: 2023-11-21 | Disposition: A | Discharge status: Home / Self Care | Attending: Emergency Medicine | Admitting: Emergency Medicine

## 2023-11-21 ENCOUNTER — Emergency Department (HOSPITAL_COMMUNITY)

## 2023-11-21 DIAGNOSIS — R0602 Shortness of breath: Secondary | ICD-10-CM | POA: Diagnosis present

## 2023-11-21 DIAGNOSIS — F17210 Nicotine dependence, cigarettes, uncomplicated: Secondary | ICD-10-CM | POA: Insufficient documentation

## 2023-11-21 DIAGNOSIS — J449 Chronic obstructive pulmonary disease, unspecified: Secondary | ICD-10-CM | POA: Diagnosis not present

## 2023-11-21 DIAGNOSIS — Z7951 Long term (current) use of inhaled steroids: Secondary | ICD-10-CM | POA: Insufficient documentation

## 2023-11-21 LAB — CBC
HCT: 31.2 % — ABNORMAL LOW (ref 36.0–46.0)
Hemoglobin: 9 g/dL — ABNORMAL LOW (ref 12.0–15.0)
MCH: 21.8 pg — ABNORMAL LOW (ref 26.0–34.0)
MCHC: 28.8 g/dL — ABNORMAL LOW (ref 30.0–36.0)
MCV: 75.7 fL — ABNORMAL LOW (ref 80.0–100.0)
Platelets: 432 10*3/uL — ABNORMAL HIGH (ref 150–400)
RBC: 4.12 MIL/uL (ref 3.87–5.11)
RDW: 23.4 % — ABNORMAL HIGH (ref 11.5–15.5)
WBC: 5.6 10*3/uL (ref 4.0–10.5)
nRBC: 0 % (ref 0.0–0.2)

## 2023-11-21 LAB — COMPREHENSIVE METABOLIC PANEL WITH GFR
ALT: 21 U/L (ref 0–44)
AST: 26 U/L (ref 15–41)
Albumin: 3.6 g/dL (ref 3.5–5.0)
Alkaline Phosphatase: 65 U/L (ref 38–126)
Anion gap: 15 (ref 5–15)
BUN: <5 mg/dL — ABNORMAL LOW (ref 6–20)
CO2: 21 mmol/L — ABNORMAL LOW (ref 22–32)
Calcium: 9 mg/dL (ref 8.9–10.3)
Chloride: 105 mmol/L (ref 98–111)
Creatinine, Ser: 0.45 mg/dL (ref 0.44–1.00)
GFR, Estimated: >60 mL/min (ref >60)
Glucose, Bld: 122 mg/dL — ABNORMAL HIGH (ref 70–99)
Potassium: 4.1 mmol/L (ref 3.5–5.1)
Sodium: 141 mmol/L (ref 135–145)
Total Bilirubin: 0.4 mg/dL (ref 0.0–1.2)
Total Protein: 7.2 g/dL (ref 6.5–8.1)

## 2023-11-21 LAB — RESP PANEL BY RT-PCR (RSV, FLU A&B, COVID)  RVPGX2
Influenza A by PCR: NEGATIVE
Influenza B by PCR: NEGATIVE
Resp Syncytial Virus by PCR: NEGATIVE
SARS Coronavirus 2 by RT PCR: NEGATIVE

## 2023-11-21 MED ORDER — IPRATROPIUM-ALBUTEROL 0.5-2.5 (3) MG/3ML IN SOLN
3.0000 mL | Freq: Once | RESPIRATORY_TRACT | Status: AC
Start: 2023-11-21 — End: 2023-11-21
  Administered 2023-11-21: 3 mL via RESPIRATORY_TRACT
  Filled 2023-11-21: qty 3

## 2023-11-21 NOTE — ED Provider Triage Note (Signed)
 Emergency Medicine Provider Triage Evaluation Note  [name] , a 58 y.o. female  was evaluated in triage.  Pt complains of increased wheezing and shortness of breath in the setting of drinking alcohol  and smoking tonight, she is wearing her oxygen  and use her inhaler with only partial relief..  Review of Systems  Positive: Breath, wheezing Negative: Chest pain  Physical Exam  BP 113/80   Pulse 98   Temp 97.7 F (36.5 C)   Resp 18   LMP 01/08/2011   SpO2 98%  Gen:   Awake, no distress   Resp:  Normal effort  MSK:   Moves extremities without difficulty  Other:    Medical Decision Making  Medically screening exam initiated at 3:31 AM.  Appropriate orders placed.  Eliya L Rother was informed that the remainder of the evaluation will be completed by another provider, this initial triage assessment does not replace that evaluation, and the importance of remaining in the ED until their evaluation is complete.     Aimee Houseman, PA-C 11/21/23 [phone]

## 2023-11-21 NOTE — ED Provider Notes (Signed)
 [place] EMERGENCY DEPARTMENT AT [facility] Provider Note   CSN: [id number] Arrival date & time: 11/21/23  7829     History  Chief Complaint  Patient presents with   Shortness of Breath    [name] is a 58 y.o. female. She has history of COPD, she presents for shortness of breath, used her inhaler at home with mild relief only, she is wearing her oxygen .  She states that she was smoking and drinking alcohol  tonight which she thinks contributed.  No chest pain, no fevers or chills.  No nausea or vomiting.  She states her symptoms started when she was smoking cigarettes and had her oxygen  off.        Shortness of Breath      Home Medications Prior to Admission medications   Medication Sig Start Date End Date Taking? Authorizing Provider  albuterol  (VENTOLIN  HFA) 108 (90 Base) MCG/ACT inhaler Inhale 1-2 puffs into the lungs every 6 (six) hours as needed for wheezing or shortness of breath. 10/29/23   Palumbo, April, MD  ARIPiprazole  (ABILIFY ) 10 MG tablet Take 1 tablet (10 mg total) by mouth daily. 11/01/23   Samtani, Jai-Gurmukh, MD  divalproex  (DEPAKOTE  SPRINKLE) 125 MG capsule Take 2 capsules (250 mg total) by mouth every 12 (twelve) hours. 11/14/23   Ghimire, Estil Heman, MD  fluticasone  furoate-vilanterol (BREO ELLIPTA ) 100-25 MCG/ACT AEPB Inhale 1 puff into the lungs daily. 11/14/23   Ghimire, Estil Heman, MD  ipratropium-albuterol  (DUONEB) 0.5-2.5 (3) MG/3ML SOLN Take 3 mLs by nebulization every 6 (six) hours as needed (for shortness of breath or wheezing).    [provider]  levETIRAcetam  (KEPPRA  XR) 500 MG 24 hr tablet Take 1 tablet (500 mg total) by mouth 2 (two) times daily. 10/31/23   Samtani, Jai-Gurmukh, MD  metFORMIN  (GLUCOPHAGE -XR) 750 MG 24 hr tablet Take 1 tablet (750 mg total) by mouth daily with breakfast. 10/31/23   Samtani, Jai-Gurmukh, MD  OXYGEN  Inhale 4-5 L/min into the lungs as needed (for COPD-related symptoms).    [provider]  thiamine  (VITAMIN B-1) 100 MG tablet Take 1 tablet (100 mg total) by mouth daily. 11/14/23   Ghimire, Estil Heman, MD  umeclidinium bromide  (INCRUSE ELLIPTA ) 62.5 MCG/ACT AEPB Inhale 1 puff into the lungs daily. 11/14/23   Ghimire, Estil Heman, MD  zonisamide  (ZONEGRAN ) 100 MG capsule Take 2 capsules (200 mg total) by mouth daily. 10/31/23   Samtani, Jai-Gurmukh, MD      Allergies    Iron  dextran and Aspirin     Review of Systems   Review of Systems  Respiratory:  Positive for shortness of breath.     Physical Exam Updated Vital Signs BP 113/80   Pulse 98   Temp 97.7 F (36.5 C)   Resp 18   LMP 01/08/2011   SpO2 98%  Physical Exam Vitals and nursing note reviewed.  Constitutional:      General: She is not in acute distress.    Appearance: She is well-developed.  HENT:     Head: Normocephalic and atraumatic.  Eyes:     Conjunctiva/sclera: Conjunctivae normal.  Cardiovascular:     Rate and Rhythm: Normal rate and regular rhythm.     Heart sounds: No murmur heard. Pulmonary:     Effort: Pulmonary effort is normal. No respiratory distress.     Breath sounds: Normal breath sounds.  Abdominal:     Palpations: Abdomen is soft.     Tenderness: There is no abdominal tenderness.  Musculoskeletal:        General: No swelling.     Cervical back: Neck supple.  Skin:    General: Skin is warm and dry.     Capillary Refill: Capillary refill takes less than 2 seconds.  Neurological:     Mental Status: She is alert.  Psychiatric:        Mood and Affect: Mood normal.     ED Results / Procedures / Treatments   Labs (all labs ordered are listed, but only abnormal results are displayed) Labs Reviewed  COMPREHENSIVE METABOLIC PANEL WITH GFR - Abnormal; Notable for the following components:      Result Value   CO2 21 (*)    Glucose, Bld 122 (*)    BUN <5 (*)    All other components within normal limits  CBC - Abnormal; Notable for the following components:   Hemoglobin 9.0 (*)     HCT 31.2 (*)    MCV 75.7 (*)    MCH 21.8 (*)    MCHC 28.8 (*)    RDW 23.4 (*)    Platelets 432 (*)    All other components within normal limits  RESP PANEL BY RT-PCR (RSV, FLU A&B, COVID)  RVPGX2    EKG None  Radiology DG Chest Port 1 View Result Date: 11/21/2023 EXAM: 1 VIEW(S) XRAY OF THE CHEST 11/21/2023 03:51:00 AM COMPARISON: 11/18/2023 . CLINICAL HISTORY: Shortness of breath. Reason for exam: shortness of breath. FINDINGS: LUNGS AND PLEURA: No focal pulmonary opacity. No pulmonary edema. No pleural effusion. No pneumothorax. HEART AND MEDIASTINUM: No acute abnormality of the cardiac and mediastinal silhouettes. BONES AND SOFT TISSUES: No acute osseous abnormality. IMPRESSION: 1. No acute process. Electronically signed by: Zadie Herter MD 11/21/2023 04:02 AM EDT RP Workstation: [id number]    Procedures Procedures    Medications Ordered in ED Medications  ipratropium-albuterol  (DUONEB) 0.5-2.5 (3) MG/3ML nebulizer solution 3 mL (3 mLs Nebulization Given 11/21/23 1308)    ED Course/ Medical Decision Making/ A&P                                 Medical Decision Making Differential diagnosis includes but not limited to pneumonia, COPD exacerbation, PE, ACS, other  ED course: Patient presents to ER for shortness of breath and wheezing.  She was drinking alcohol  and smoking.  She took her oxygen  off so she could smoke without the danger of hurting herself.  She started wheezing and used her inhaler with some mild relief and then presents the ER via EMS.  She has a mild wheezing but was satting 98% on her home 2 L.  After 1 DuoNeb her symptoms have resolved and she is requesting to go home.  Lungs are now clear to auscultation.  Chest x-ray shows no pulmonary edema or infiltrate.  EKG was reassuring.  She is not having productive cough or fevers to suggest pneumonia.  Negative for COVID flu and RSV.  Patient advised on smoking cessation, pulmonology follow-up and return  precautions.  Patient was here approximately 3 days ago for wheezing that resolved with 1 treatment via EMS.  She did not have recurrence until tonight  Patient's hemoglobin is at her baseline  Amount and/or Complexity of Data Reviewed External Data Reviewed: labs and notes. Labs: ordered. Radiology: ordered.  Risk Prescription drug management.           Final Clinical Impression(s) / ED Diagnoses Final diagnoses:  Shortness of breath    Rx / DC Orders ED Discharge Orders     None         Aimee Houseman, PA-C 11/21/23 0526    Ballard Bongo, MD 11/22/23 [phone]

## 2023-11-21 NOTE — Discharge Instructions (Signed)
 You are seen today for wheezing in the setting of smoking and taking your oxygen  off.  We are glad you are feeling better after nebulizer treatment.  Your blood work was reassuring.  You should stop smoking.  Follow-up with the lung doctor.  Come back to the ER for new or worsening symptoms.

## 2023-11-21 NOTE — ED Triage Notes (Signed)
 Arrives EMS from home with c/o ongoing shob tonight.   Says she had a breathing treatment tonight with minimal improvement.

## 2023-11-21 NOTE — ED Notes (Signed)
 Pt removed oxygen  to step outside to smoke

## 2023-11-26 ENCOUNTER — Other Ambulatory Visit: Payer: Self-pay

## 2023-11-26 ENCOUNTER — Emergency Department (HOSPITAL_COMMUNITY)

## 2023-11-26 ENCOUNTER — Emergency Department (HOSPITAL_COMMUNITY)
Admission: EM | Admit: 2023-11-26 | Discharge: 2023-11-26 | Disposition: A | Discharge status: Home / Self Care | Attending: Emergency Medicine | Admitting: Emergency Medicine

## 2023-11-26 ENCOUNTER — Encounter (HOSPITAL_COMMUNITY): Payer: Self-pay | Admitting: Emergency Medicine

## 2023-11-26 DIAGNOSIS — R062 Wheezing: Secondary | ICD-10-CM | POA: Insufficient documentation

## 2023-11-26 DIAGNOSIS — G8929 Other chronic pain: Secondary | ICD-10-CM | POA: Insufficient documentation

## 2023-11-26 DIAGNOSIS — F10120 Alcohol abuse with intoxication, uncomplicated: Secondary | ICD-10-CM | POA: Insufficient documentation

## 2023-11-26 DIAGNOSIS — K219 Gastro-esophageal reflux disease without esophagitis: Secondary | ICD-10-CM | POA: Insufficient documentation

## 2023-11-26 DIAGNOSIS — I480 Paroxysmal atrial fibrillation: Secondary | ICD-10-CM | POA: Diagnosis not present

## 2023-11-26 DIAGNOSIS — E039 Hypothyroidism, unspecified: Secondary | ICD-10-CM | POA: Diagnosis not present

## 2023-11-26 DIAGNOSIS — R739 Hyperglycemia, unspecified: Secondary | ICD-10-CM | POA: Diagnosis not present

## 2023-11-26 DIAGNOSIS — F10920 Alcohol use, unspecified with intoxication, uncomplicated: Secondary | ICD-10-CM

## 2023-11-26 DIAGNOSIS — R0602 Shortness of breath: Secondary | ICD-10-CM | POA: Diagnosis not present

## 2023-11-26 DIAGNOSIS — Y905 Blood alcohol level of 100-119 mg/100 ml: Secondary | ICD-10-CM | POA: Insufficient documentation

## 2023-11-26 DIAGNOSIS — Z7951 Long term (current) use of inhaled steroids: Secondary | ICD-10-CM | POA: Diagnosis not present

## 2023-11-26 DIAGNOSIS — Z9981 Dependence on supplemental oxygen: Secondary | ICD-10-CM | POA: Diagnosis not present

## 2023-11-26 DIAGNOSIS — J449 Chronic obstructive pulmonary disease, unspecified: Secondary | ICD-10-CM | POA: Insufficient documentation

## 2023-11-26 LAB — CBC WITH DIFFERENTIAL/PLATELET
Abs Immature Granulocytes: 0.01 10*3/uL (ref 0.00–0.07)
Basophils Absolute: 0.1 10*3/uL (ref 0.0–0.1)
Basophils Relative: 1 %
Eosinophils Absolute: 0.1 10*3/uL (ref 0.0–0.5)
Eosinophils Relative: 2 %
HCT: 32 % — ABNORMAL LOW (ref 36.0–46.0)
Hemoglobin: 9.3 g/dL — ABNORMAL LOW (ref 12.0–15.0)
Immature Granulocytes: 0 %
Lymphocytes Relative: 34 %
Lymphs Abs: 1.5 10*3/uL (ref 0.7–4.0)
MCH: 21.8 pg — ABNORMAL LOW (ref 26.0–34.0)
MCHC: 29.1 g/dL — ABNORMAL LOW (ref 30.0–36.0)
MCV: 74.9 fL — ABNORMAL LOW (ref 80.0–100.0)
Monocytes Absolute: 0.4 10*3/uL (ref 0.1–1.0)
Monocytes Relative: 10 %
Neutro Abs: 2.4 10*3/uL (ref 1.7–7.7)
Neutrophils Relative %: 53 %
Platelets: 409 10*3/uL — ABNORMAL HIGH (ref 150–400)
RBC: 4.27 MIL/uL (ref 3.87–5.11)
RDW: 22.5 % — ABNORMAL HIGH (ref 11.5–15.5)
Smear Review: NORMAL
WBC: 4.4 10*3/uL (ref 4.0–10.5)
nRBC: 0 % (ref 0.0–0.2)

## 2023-11-26 LAB — COMPREHENSIVE METABOLIC PANEL WITH GFR
ALT: 16 U/L (ref 0–44)
AST: 25 U/L (ref 15–41)
Albumin: 3.2 g/dL — ABNORMAL LOW (ref 3.5–5.0)
Alkaline Phosphatase: 73 U/L (ref 38–126)
Anion gap: 8 (ref 5–15)
BUN: 5 mg/dL — ABNORMAL LOW (ref 6–20)
CO2: 24 mmol/L (ref 22–32)
Calcium: 8.6 mg/dL — ABNORMAL LOW (ref 8.9–10.3)
Chloride: 109 mmol/L (ref 98–111)
Creatinine, Ser: 0.59 mg/dL (ref 0.44–1.00)
GFR, Estimated: >60 mL/min (ref >60)
Glucose, Bld: 264 mg/dL — ABNORMAL HIGH (ref 70–99)
Potassium: 4 mmol/L (ref 3.5–5.1)
Sodium: 141 mmol/L (ref 135–145)
Total Bilirubin: 0.6 mg/dL (ref 0.0–1.2)
Total Protein: 7.3 g/dL (ref 6.5–8.1)

## 2023-11-26 LAB — ETHANOL: Alcohol, Ethyl (B): 100 mg/dL — ABNORMAL HIGH (ref <15)

## 2023-11-26 MED ORDER — METHYLPREDNISOLONE SODIUM SUCC 125 MG IJ SOLR
125.0000 mg | Freq: Once | INTRAMUSCULAR | Status: AC
  Administered 2023-11-26: 125 mg via INTRAVENOUS
  Filled 2023-11-26: qty 2

## 2023-11-26 MED ORDER — IPRATROPIUM-ALBUTEROL 0.5-2.5 (3) MG/3ML IN SOLN
3.0000 mL | Freq: Once | RESPIRATORY_TRACT | Status: AC
  Administered 2023-11-26: 3 mL via RESPIRATORY_TRACT
  Filled 2023-11-26: qty 3

## 2023-11-26 NOTE — ED Triage Notes (Signed)
 Pt BIB EMS from home with c/o SHOB. Went to sleep with her O2 on, wears 3lpm, woke up without it. 95% on 3lpm on arrival. Etoh on board.  130/60 98HR [id number]   1 duoneb with ems

## 2023-11-26 NOTE — Discharge Instructions (Addendum)
 Continue your home inhalers. Follow-up with your doctor. Return here for new concerns.

## 2023-11-26 NOTE — ED Notes (Signed)
 Pt ambulated to the BR without assistance.

## 2023-11-26 NOTE — ED Provider Notes (Signed)
 [place] EMERGENCY DEPARTMENT AT [facility] Provider Note   CSN: [id number] Arrival date & time: 11/26/23  0422     History  Chief Complaint  Patient presents with   Shortness of Breath    [name] is a 58 y.o. female.  The history is provided by the patient and medical records.  Shortness of Breath  58 y.o. F with history of alcohol  abuse, chronic pain, COPD on home O2, GERD, hypothyroidism, paroxysmal A-fib not on anticoagulation, presenting to the ED with shortness of breath.  States she went to bed as normal last night with her oxygen , woke up without and was feeling very short of breath.  She called EMS and had a few beers while waiting for them to arrive.  Also had some drinks last night as well.  She did have a DuoNeb with EMS and route, states she still feels short of breath but better now than when she initially woke up.  Denies chest pain, abdominal pain, fever, etc.  Home Medications Prior to Admission medications   Medication Sig Start Date End Date Taking? Authorizing Provider  albuterol  (VENTOLIN  HFA) 108 (90 Base) MCG/ACT inhaler Inhale 1-2 puffs into the lungs every 6 (six) hours as needed for wheezing or shortness of breath. 10/29/23   Palumbo, April, MD  ARIPiprazole  (ABILIFY ) 10 MG tablet Take 1 tablet (10 mg total) by mouth daily. 11/01/23   Samtani, Jai-Gurmukh, MD  divalproex  (DEPAKOTE  SPRINKLE) 125 MG capsule Take 2 capsules (250 mg total) by mouth every 12 (twelve) hours. 11/14/23   Ghimire, Estil Heman, MD  fluticasone  furoate-vilanterol (BREO ELLIPTA ) 100-25 MCG/ACT AEPB Inhale 1 puff into the lungs daily. 11/14/23   Ghimire, Estil Heman, MD  ipratropium-albuterol  (DUONEB) 0.5-2.5 (3) MG/3ML SOLN Take 3 mLs by nebulization every 6 (six) hours as needed (for shortness of breath or wheezing).    [provider]  levETIRAcetam  (KEPPRA  XR) 500 MG 24 hr tablet Take 1 tablet (500 mg total) by mouth 2 (two) times daily. 10/31/23   Samtani,  Jai-Gurmukh, MD  metFORMIN  (GLUCOPHAGE -XR) 750 MG 24 hr tablet Take 1 tablet (750 mg total) by mouth daily with breakfast. 10/31/23   Samtani, Jai-Gurmukh, MD  OXYGEN  Inhale 4-5 L/min into the lungs as needed (for COPD-related symptoms).    [provider]  thiamine  (VITAMIN B-1) 100 MG tablet Take 1 tablet (100 mg total) by mouth daily. 11/14/23   Ghimire, Estil Heman, MD  umeclidinium bromide  (INCRUSE ELLIPTA ) 62.5 MCG/ACT AEPB Inhale 1 puff into the lungs daily. 11/14/23   Ghimire, Estil Heman, MD  zonisamide  (ZONEGRAN ) 100 MG capsule Take 2 capsules (200 mg total) by mouth daily. 10/31/23   Samtani, Jai-Gurmukh, MD      Allergies    Iron  dextran and Aspirin     Review of Systems   Review of Systems  Respiratory:  Positive for shortness of breath.   All other systems reviewed and are negative.   Physical Exam Updated Vital Signs BP 126/76   Pulse (!) 108   Temp 98.2 F (36.8 C) (Oral)   Resp (!) 24   Ht 5\' 2"  (1.575 m)   Wt 58 kg   LMP 01/08/2011   SpO2 96%   BMI 23.39 kg/m   Physical Exam Vitals and nursing note reviewed.  Constitutional:      Appearance: She is well-developed.     Comments: Seems a bit intoxicated  HENT:     Head: Normocephalic and atraumatic.  Eyes:  Conjunctiva/sclera: Conjunctivae normal.     Pupils: Pupils are equal, round, and reactive to light.  Cardiovascular:     Rate and Rhythm: Normal rate and regular rhythm.     Heart sounds: Normal heart sounds.  Pulmonary:     Effort: Pulmonary effort is normal.     Breath sounds: Wheezing present.     Comments: Faint expiratory wheezes, NAD, able to speak in full sentences, sats around 94% on RA Abdominal:     General: Bowel sounds are normal.     Palpations: Abdomen is soft.  Musculoskeletal:        General: Normal range of motion.     Cervical back: Normal range of motion.  Skin:    General: Skin is warm and dry.  Neurological:     Mental Status: She is alert and oriented to person,  place, and time.     ED Results / Procedures / Treatments   Labs (all labs ordered are listed, but only abnormal results are displayed) Labs Reviewed  CBC WITH DIFFERENTIAL/PLATELET - Abnormal; Notable for the following components:      Result Value   Hemoglobin 9.3 (*)    HCT 32.0 (*)    MCV 74.9 (*)    MCH 21.8 (*)    MCHC 29.1 (*)    RDW 22.5 (*)    Platelets 409 (*)    All other components within normal limits  COMPREHENSIVE METABOLIC PANEL WITH GFR - Abnormal; Notable for the following components:   Glucose, Bld 264 (*)    BUN 5 (*)    Calcium  8.6 (*)    Albumin  3.2 (*)    All other components within normal limits  ETHANOL - Abnormal; Notable for the following components:   Alcohol , Ethyl (B) 100 (*)    All other components within normal limits    EKG None  Radiology DG Chest Port 1 View Result Date: 11/26/2023 EXAM: 1 VIEW XRAY OF THE CHEST 11/26/2023 04:51:00 AM COMPARISON: 1 view chest x-ray 11/21/2023 CLINICAL HISTORY: Shortness of breath, copd; FINDINGS: LUNGS AND PLEURA: Lung volumes are low. No focal pulmonary opacity. No pulmonary edema. No pleural effusion. No pneumothorax. HEART AND MEDIASTINUM: No acute abnormality of the cardiac and mediastinal silhouettes. BONES AND SOFT TISSUES: No acute osseous abnormality. IMPRESSION: 1. No acute cardiopulmonary pathology. Electronically signed by: Audree Leas MD 11/26/2023 05:16 AM EDT RP Workstation: [id number]    Procedures Procedures    Medications Ordered in ED Medications  methylPREDNISolone  sodium succinate (SOLU-MEDROL ) 125 mg/2 mL injection 125 mg (has no administration in time range)  ipratropium-albuterol  (DUONEB) 0.5-2.5 (3) MG/3ML nebulizer solution 3 mL (has no administration in time range)    ED Course/ Medical Decision Making/ A&P                                 Medical Decision Making Amount and/or Complexity of Data Reviewed Labs: ordered. Radiology: ordered and independent  interpretation performed. ECG/medicine tests: ordered and independent interpretation performed.  Risk Prescription drug management.   58 y.o. F here with SOB.  Well known to the ED for same.  Had a few beers last night and again this morning while waiting for EMS to arrival.  Did have duoneb with EMS en route.  Denies any chest pain.  She is afebrile and nontoxic in appearance here.  She is saturating well on room air without her supplemental oxygen .  She does seem a  little bit intoxicated but is still able to answer questions and follow commands appropriately.  EKG sinus tachycardia at 102.  No acute ischemia.  Labs as above--no leukocytosis.  Does have some hyperglycemia but no findings of DKA.  Ethanol 100.  Chest x-ray without acute findings.  Patient has received Solu-Medrol  and additional DuoNeb here with resolution of her symptoms.  She is ambulatory to and from bathroom without oxygen  or any signs of distress.  She is tolerating oral fluids well currently.  At this time, does not appear to need further testing or inpatient hospitalization.  Appears stable for discharge.  Will have her continue her home inhalers and follow-up closely with her primary care doctor.  She can return here for any new or acute changes.  Final Clinical Impression(s) / ED Diagnoses Final diagnoses:  Shortness of breath  Alcoholic intoxication without complication [facility])    Rx / DC Orders ED Discharge Orders     None         Coretha Dew, PA-C 11/26/23 0981    Eve Hinders, MD 11/27/23 [phone]

## 2023-11-27 ENCOUNTER — Other Ambulatory Visit: Payer: Self-pay

## 2023-11-27 ENCOUNTER — Encounter (HOSPITAL_COMMUNITY): Payer: Self-pay

## 2023-11-27 ENCOUNTER — Emergency Department (HOSPITAL_COMMUNITY)

## 2023-11-27 ENCOUNTER — Emergency Department (HOSPITAL_COMMUNITY)
Admission: EM | Admit: 2023-11-27 | Discharge: 2023-11-27 | Disposition: A | Discharge status: Home / Self Care | Attending: Emergency Medicine | Admitting: Emergency Medicine

## 2023-11-27 DIAGNOSIS — F10920 Alcohol use, unspecified with intoxication, uncomplicated: Secondary | ICD-10-CM

## 2023-11-27 DIAGNOSIS — R519 Headache, unspecified: Secondary | ICD-10-CM | POA: Diagnosis present

## 2023-11-27 DIAGNOSIS — R0602 Shortness of breath: Secondary | ICD-10-CM

## 2023-11-27 DIAGNOSIS — H9202 Otalgia, left ear: Secondary | ICD-10-CM | POA: Diagnosis not present

## 2023-11-27 DIAGNOSIS — Z79899 Other long term (current) drug therapy: Secondary | ICD-10-CM | POA: Insufficient documentation

## 2023-11-27 LAB — CBC
HCT: 31.9 % — ABNORMAL LOW (ref 36.0–46.0)
Hemoglobin: 9.1 g/dL — ABNORMAL LOW (ref 12.0–15.0)
MCH: 21.7 pg — ABNORMAL LOW (ref 26.0–34.0)
MCHC: 28.5 g/dL — ABNORMAL LOW (ref 30.0–36.0)
MCV: 76.1 fL — ABNORMAL LOW (ref 80.0–100.0)
Platelets: 341 10*3/uL (ref 150–400)
RBC: 4.19 MIL/uL (ref 3.87–5.11)
RDW: 22 % — ABNORMAL HIGH (ref 11.5–15.5)
WBC: 7.1 10*3/uL (ref 4.0–10.5)
nRBC: 0 % (ref 0.0–0.2)

## 2023-11-27 LAB — COMPREHENSIVE METABOLIC PANEL WITH GFR
ALT: 17 U/L (ref 0–44)
AST: 20 U/L (ref 15–41)
Albumin: 3.6 g/dL (ref 3.5–5.0)
Alkaline Phosphatase: 67 U/L (ref 38–126)
Anion gap: 15 (ref 5–15)
BUN: 8 mg/dL (ref 6–20)
CO2: 20 mmol/L — ABNORMAL LOW (ref 22–32)
Calcium: 8.8 mg/dL — ABNORMAL LOW (ref 8.9–10.3)
Chloride: 104 mmol/L (ref 98–111)
Creatinine, Ser: 0.47 mg/dL (ref 0.44–1.00)
GFR, Estimated: >60 mL/min (ref >60)
Glucose, Bld: 164 mg/dL — ABNORMAL HIGH (ref 70–99)
Potassium: 3.9 mmol/L (ref 3.5–5.1)
Sodium: 139 mmol/L (ref 135–145)
Total Bilirubin: 0.5 mg/dL (ref 0.0–1.2)
Total Protein: 6.9 g/dL (ref 6.5–8.1)

## 2023-11-27 LAB — ETHANOL: Alcohol, Ethyl (B): 98 mg/dL — ABNORMAL HIGH (ref <15)

## 2023-11-27 MED ORDER — PREDNISONE 20 MG PO TABS: 40.0000 mg | ORAL_TABLET | Freq: Every day | ORAL | 0 refills | Status: DC

## 2023-11-27 MED ORDER — PREDNISONE 20 MG PO TABS
40.0000 mg | ORAL_TABLET | Freq: Once | ORAL | Status: AC
  Administered 2023-11-27: 40 mg via ORAL
  Filled 2023-11-27: qty 2

## 2023-11-27 MED ORDER — IPRATROPIUM-ALBUTEROL 0.5-2.5 (3) MG/3ML IN SOLN
3.0000 mL | Freq: Once | RESPIRATORY_TRACT | Status: AC
  Administered 2023-11-27: 3 mL via RESPIRATORY_TRACT
  Filled 2023-11-27: qty 3

## 2023-11-27 NOTE — ED Provider Notes (Signed)
 [place] EMERGENCY DEPARTMENT AT [facility] Provider Note   CSN: [id number] Arrival date & time: 11/27/23  0554     History  Chief Complaint  Patient presents with   Shortness of Breath    [name] is a 58 y.o. female.  Patient with alcohol  use disorder, COPD, hypertension, diabetes on 4-5 L nasal cannula at baseline presenting to emergency room with complaint of shortness of breath.  Patient reports that she was drinking alcohol  and smoking a cigarette so she removed her oxygen  and then began feeling quite short of breath.  Patient reports that once EMS gave her DuoNeb and placed her back on oxygen  she started to feel better.  Reports she is had some coughing and continues to have some shortness of breath.  Denies chest pain fever or other symptoms.   Shortness of Breath      Home Medications Prior to Admission medications   Medication Sig Start Date End Date Taking? Authorizing Provider  albuterol  (VENTOLIN  HFA) 108 (90 Base) MCG/ACT inhaler Inhale 1-2 puffs into the lungs every 6 (six) hours as needed for wheezing or shortness of breath. 10/29/23   Palumbo, April, MD  ARIPiprazole  (ABILIFY ) 10 MG tablet Take 1 tablet (10 mg total) by mouth daily. 11/01/23   Samtani, Jai-Gurmukh, MD  divalproex  (DEPAKOTE  SPRINKLE) 125 MG capsule Take 2 capsules (250 mg total) by mouth every 12 (twelve) hours. 11/14/23   Ghimire, Estil Heman, MD  fluticasone  furoate-vilanterol (BREO ELLIPTA ) 100-25 MCG/ACT AEPB Inhale 1 puff into the lungs daily. 11/14/23   Ghimire, Estil Heman, MD  ipratropium-albuterol  (DUONEB) 0.5-2.5 (3) MG/3ML SOLN Take 3 mLs by nebulization every 6 (six) hours as needed (for shortness of breath or wheezing).    [provider]  levETIRAcetam  (KEPPRA  XR) 500 MG 24 hr tablet Take 1 tablet (500 mg total) by mouth 2 (two) times daily. 10/31/23   Samtani, Jai-Gurmukh, MD  metFORMIN  (GLUCOPHAGE -XR) 750 MG 24 hr tablet Take 1 tablet (750 mg total) by mouth daily with  breakfast. 10/31/23   Samtani, Jai-Gurmukh, MD  OXYGEN  Inhale 4-5 L/min into the lungs as needed (for COPD-related symptoms).    [provider]  thiamine  (VITAMIN B-1) 100 MG tablet Take 1 tablet (100 mg total) by mouth daily. 11/14/23   Ghimire, Estil Heman, MD  umeclidinium bromide  (INCRUSE ELLIPTA ) 62.5 MCG/ACT AEPB Inhale 1 puff into the lungs daily. 11/14/23   Ghimire, Estil Heman, MD  zonisamide  (ZONEGRAN ) 100 MG capsule Take 2 capsules (200 mg total) by mouth daily. 10/31/23   Samtani, Jai-Gurmukh, MD      Allergies    Iron  dextran and Aspirin     Review of Systems   Review of Systems  Respiratory:  Positive for shortness of breath.     Physical Exam Updated Vital Signs BP (!) 113/52 (BP Location: Right Arm)   Pulse (!) 103   Temp 98.1 F (36.7 C) (Oral)   Resp 20   Ht 5' 2 (1.575 m)   Wt 58 kg   LMP 01/08/2011   SpO2 100%   BMI 23.39 kg/m  Physical Exam Vitals and nursing note reviewed.  Constitutional:      General: She is not in acute distress.    Appearance: She is not toxic-appearing.  HENT:     Head: Normocephalic and atraumatic.  Eyes:     General: No scleral icterus.    Conjunctiva/sclera: Conjunctivae normal.  Cardiovascular:     Rate and Rhythm: Normal rate and regular rhythm.  Pulses: Normal pulses.     Heart sounds: Normal heart sounds.  Pulmonary:     Effort: Pulmonary effort is normal. No respiratory distress.     Breath sounds: Wheezing present.     Comments: Mild wheezing in bilaterally lower lung bases.  Abdominal:     General: Abdomen is flat. Bowel sounds are normal.     Palpations: Abdomen is soft.     Tenderness: There is no abdominal tenderness.  Musculoskeletal:     Right lower leg: No edema.     Left lower leg: No edema.  Skin:    General: Skin is warm and dry.     Findings: No lesion.  Neurological:     General: No focal deficit present.     Mental Status: She is alert and oriented to person, place, and time. Mental status  is at baseline.     ED Results / Procedures / Treatments   Labs (all labs ordered are listed, but only abnormal results are displayed) Labs Reviewed  COMPREHENSIVE METABOLIC PANEL WITH GFR - Abnormal; Notable for the following components:      Result Value   CO2 20 (*)    Glucose, Bld 164 (*)    Calcium  8.8 (*)    All other components within normal limits  CBC - Abnormal; Notable for the following components:   Hemoglobin 9.1 (*)    HCT 31.9 (*)    MCV 76.1 (*)    MCH 21.7 (*)    MCHC 28.5 (*)    RDW 22.0 (*)    All other components within normal limits  ETHANOL - Abnormal; Notable for the following components:   Alcohol , Ethyl (B) 98 (*)    All other components within normal limits    EKG None  Radiology DG Chest 1 View Result Date: 11/27/2023 CLINICAL DATA:  Shortness of breath. EXAM: CHEST  1 VIEW COMPARISON:  11/26/2023 FINDINGS: Heart size and mediastinal contours are normal. There is no pleural fluid, interstitial edema or airspace disease. Visualized osseous structures are unremarkable. IMPRESSION: No active disease. Electronically Signed   By: Kimberley Penman M.D.   On: 11/27/2023 07:18   DG Chest Port 1 View Result Date: 11/26/2023 EXAM: 1 VIEW XRAY OF THE CHEST 11/26/2023 04:51:00 AM COMPARISON: 1 view chest x-ray 11/21/2023 CLINICAL HISTORY: Shortness of breath, copd; FINDINGS: LUNGS AND PLEURA: Lung volumes are low. No focal pulmonary opacity. No pulmonary edema. No pleural effusion. No pneumothorax. HEART AND MEDIASTINUM: No acute abnormality of the cardiac and mediastinal silhouettes. BONES AND SOFT TISSUES: No acute osseous abnormality. IMPRESSION: 1. No acute cardiopulmonary pathology. Electronically signed by: Audree Leas MD 11/26/2023 05:16 AM EDT RP Workstation: [id number]    Procedures Procedures    Medications Ordered in ED Medications  ipratropium-albuterol  (DUONEB) 0.5-2.5 (3) MG/3ML nebulizer solution 3 mL (has no administration in time range)   predniSONE  (DELTASONE ) tablet 40 mg (has no administration in time range)    ED Course/ Medical Decision Making/ A&P                                 Medical Decision Making Amount and/or Complexity of Data Reviewed Labs: ordered. Radiology: ordered.  Risk Prescription drug management.   This patient presents to the ED for concern of shortness of breath, this involves an extensive number of treatment options, and is a complaint that carries with it a high risk of complications and morbidity.  The  differential diagnosis includes COPD, URI, PE, ACS   Co morbidities that complicate the patient evaluation  COPD   Additional history obtained:  Additional history obtained from 11/21/23 ED visit for similar complaint of SOB   Lab Tests:  I personally interpreted labs.  The pertinent results include:   CBC without leukoytosis, hbg   Imaging Studies ordered:  I ordered imaging studies including chest x-ray   I independently visualized and interpreted imaging which showed no acute findings.  I agree with the radiologist interpretation   Cardiac Monitoring: / EKG:  The patient was maintained on a cardiac monitor.    Problem List / ED Course / Critical interventions / Medication management  Patient reporting with complaint of shortness of breath. Patient was not on oxygen  when EMS arrived. Was given DuoNeb and placed back on Oxygen  in route with EMS, was found to have mild end exp wheezing on exam. Patient reports she is starting to feel better after treatment. Vitals stable, patient is on baseline oxygen , ambulatory in hall way without acute distress and without desat. She is non toxic appearing. Will check basic labs, EKG and chest x-ray. Will give DuoNeb and prednisone .  Patient feeling better. No sign of EtOH withdrawal. Patient has reassuring labs and imaging. Feel stable for discharge at this time.  I ordered medication including DuoNeb, Prednisone .  Reevaluation of the  patient after these medicines showed that the patient improved I have reviewed the patients home medicines and have made adjustments as needed   Plan  F/u w/ PCP in 2-3d to ensure resolution of sx.  Patient was given return precautions. Patient stable for discharge at this time.  Patient educated on sx/dx and verbalized understanding of plan. Return to ER w/ new or worsening sx.          Final Clinical Impression(s) / ED Diagnoses Final diagnoses:  SOB (shortness of breath)  Alcoholic intoxication without complication [facility])    Rx / DC Orders ED Discharge Orders     None         Eudora Heron, PA-C 11/27/23 0981    Auston Blush, MD 12/03/23 1039

## 2023-11-27 NOTE — ED Notes (Signed)
 2nd attempt made to reach guardian

## 2023-11-27 NOTE — ED Triage Notes (Signed)
 Pt BIB [facility] from home. EMS reports sudden onset of SHOB. EMS reports wheezing in the lower lobes. ETOH is on board today.  Wears 3L [place] at baselines.   EMS VSS EMS gave 1 duoneb treatment

## 2023-11-28 ENCOUNTER — Encounter (HOSPITAL_COMMUNITY): Payer: Self-pay

## 2023-11-28 ENCOUNTER — Emergency Department (HOSPITAL_COMMUNITY)
Admission: EM | Admit: 2023-11-28 | Discharge: 2023-11-28 | Disposition: A | Discharge status: Home / Self Care | Attending: Emergency Medicine | Admitting: Emergency Medicine

## 2023-11-28 DIAGNOSIS — J449 Chronic obstructive pulmonary disease, unspecified: Secondary | ICD-10-CM | POA: Insufficient documentation

## 2023-11-28 DIAGNOSIS — Z7951 Long term (current) use of inhaled steroids: Secondary | ICD-10-CM | POA: Insufficient documentation

## 2023-11-28 DIAGNOSIS — A419 Sepsis, unspecified organism: Secondary | ICD-10-CM | POA: Diagnosis not present

## 2023-11-28 DIAGNOSIS — E039 Hypothyroidism, unspecified: Secondary | ICD-10-CM | POA: Insufficient documentation

## 2023-11-28 DIAGNOSIS — R0602 Shortness of breath: Secondary | ICD-10-CM | POA: Insufficient documentation

## 2023-11-28 MED ORDER — ALBUTEROL SULFATE HFA 108 (90 BASE) MCG/ACT IN AERS
2.0000 | INHALATION_SPRAY | Freq: Once | RESPIRATORY_TRACT | Status: AC
  Administered 2023-11-28: 2 via RESPIRATORY_TRACT
  Filled 2023-11-28: qty 6.7

## 2023-11-28 MED ORDER — NICOTINE 21 MG/24HR TD PT24
21.0000 mg | MEDICATED_PATCH | Freq: Once | TRANSDERMAL | Status: DC
  Administered 2023-11-28: 21 mg via TRANSDERMAL
  Filled 2023-11-28: qty 1

## 2023-11-28 MED ORDER — IPRATROPIUM-ALBUTEROL 0.5-2.5 (3) MG/3ML IN SOLN
3.0000 mL | Freq: Once | RESPIRATORY_TRACT | Status: AC
  Administered 2023-11-28: 3 mL via RESPIRATORY_TRACT
  Filled 2023-11-28: qty 3

## 2023-11-28 NOTE — ED Triage Notes (Signed)
 PT arrived via EMS for SOB, PT is chronically SOB and wears 5L at home.PT was 99% on her 5L per EMS all other VSS. PT Alert and talking and states she did drink some alcohol . PT states some ABD pain but wants a breathing treatment.

## 2023-11-28 NOTE — ED Provider Notes (Signed)
 [place] EMERGENCY DEPARTMENT AT [facility] Provider Note   CSN: [id number] Arrival date & time: 11/28/23  0258     History  Chief Complaint  Patient presents with   Shortness of Breath    [name] is a 58 y.o. female.  The history is provided by the patient and medical records.  Shortness of Breath  58 y.o. F with hx of COPD on chronic O2, alcohol  abuse, cocaine abuse, DDD, hypothyroidism, depression, PAF not on anticoagulation, presenting to the ED with SOB.  This is her 3rd visit in the 3 days for same.  She states she just feels SOB.  States she has been wearing her oxygen .  Has not used her inhalers at home PTA. Asking for breathing treatment.  Reported abdominal pain to RN but denies this to me.  Home Medications Prior to Admission medications   Medication Sig Start Date End Date Taking? Authorizing Provider  albuterol  (VENTOLIN  HFA) 108 (90 Base) MCG/ACT inhaler Inhale 1-2 puffs into the lungs every 6 (six) hours as needed for wheezing or shortness of breath. 10/29/23   Palumbo, April, MD  ARIPiprazole  (ABILIFY ) 10 MG tablet Take 1 tablet (10 mg total) by mouth daily. 11/01/23   Samtani, Jai-Gurmukh, MD  divalproex  (DEPAKOTE  SPRINKLE) 125 MG capsule Take 2 capsules (250 mg total) by mouth every 12 (twelve) hours. 11/14/23   Ghimire, Estil Heman, MD  fluticasone  furoate-vilanterol (BREO ELLIPTA ) 100-25 MCG/ACT AEPB Inhale 1 puff into the lungs daily. 11/14/23   Ghimire, Estil Heman, MD  ipratropium-albuterol  (DUONEB) 0.5-2.5 (3) MG/3ML SOLN Take 3 mLs by nebulization every 6 (six) hours as needed (for shortness of breath or wheezing).    [provider]  levETIRAcetam  (KEPPRA  XR) 500 MG 24 hr tablet Take 1 tablet (500 mg total) by mouth 2 (two) times daily. 10/31/23   Samtani, Jai-Gurmukh, MD  metFORMIN  (GLUCOPHAGE -XR) 750 MG 24 hr tablet Take 1 tablet (750 mg total) by mouth daily with breakfast. 10/31/23   Samtani, Jai-Gurmukh, MD  OXYGEN  Inhale 4-5 L/min into  the lungs as needed (for COPD-related symptoms).    [provider]  predniSONE  (DELTASONE ) 20 MG tablet Take 2 tablets (40 mg total) by mouth daily. 11/27/23   Barrett, Kandace Organ, PA-C  thiamine  (VITAMIN B-1) 100 MG tablet Take 1 tablet (100 mg total) by mouth daily. 11/14/23   Ghimire, Estil Heman, MD  umeclidinium bromide  (INCRUSE ELLIPTA ) 62.5 MCG/ACT AEPB Inhale 1 puff into the lungs daily. 11/14/23   Ghimire, Estil Heman, MD  zonisamide  (ZONEGRAN ) 100 MG capsule Take 2 capsules (200 mg total) by mouth daily. 10/31/23   Samtani, Jai-Gurmukh, MD      Allergies    Iron  dextran and Aspirin     Review of Systems   Review of Systems  Respiratory:  Positive for shortness of breath.   All other systems reviewed and are negative.   Physical Exam Updated Vital Signs BP 127/82   Pulse 100   Temp 98.2 F (36.8 C)   Resp (!) 22   Ht 5\' 2"  (1.575 m)   Wt 58 kg   LMP 01/08/2011   SpO2 99%   BMI 23.39 kg/m   Physical Exam Vitals and nursing note reviewed.  Constitutional:      Appearance: She is well-developed.     Comments: Up and pacing halls without O2, appears at baseline  HENT:     Head: Normocephalic and atraumatic.  Eyes:     Conjunctiva/sclera: Conjunctivae normal.  Pupils: Pupils are equal, round, and reactive to light.  Cardiovascular:     Rate and Rhythm: Normal rate and regular rhythm.     Heart sounds: Normal heart sounds.  Pulmonary:     Effort: Pulmonary effort is normal.     Breath sounds: Wheezing present.     Comments: Expiratory wheezes present Abdominal:     General: Bowel sounds are normal.     Palpations: Abdomen is soft.  Musculoskeletal:        General: Normal range of motion.     Cervical back: Normal range of motion.  Skin:    General: Skin is warm and dry.  Neurological:     Mental Status: She is alert and oriented to person, place, and time.     ED Results / Procedures / Treatments   Labs (all labs ordered are listed, but only abnormal  results are displayed) Labs Reviewed - No data to display  EKG None  Radiology DG Chest 1 View Result Date: 11/27/2023 CLINICAL DATA:  Shortness of breath. EXAM: CHEST  1 VIEW COMPARISON:  11/26/2023 FINDINGS: Heart size and mediastinal contours are normal. There is no pleural fluid, interstitial edema or airspace disease. Visualized osseous structures are unremarkable. IMPRESSION: No active disease. Electronically Signed   By: Kimberley Penman M.D.   On: 11/27/2023 07:18   DG Chest Port 1 View Result Date: 11/26/2023 EXAM: 1 VIEW XRAY OF THE CHEST 11/26/2023 04:51:00 AM COMPARISON: 1 view chest x-ray 11/21/2023 CLINICAL HISTORY: Shortness of breath, copd; FINDINGS: LUNGS AND PLEURA: Lung volumes are low. No focal pulmonary opacity. No pulmonary edema. No pleural effusion. No pneumothorax. HEART AND MEDIASTINUM: No acute abnormality of the cardiac and mediastinal silhouettes. BONES AND SOFT TISSUES: No acute osseous abnormality. IMPRESSION: 1. No acute cardiopulmonary pathology. Electronically signed by: Audree Leas MD 11/26/2023 05:16 AM EDT RP Workstation: [id number]    Procedures Procedures    Medications Ordered in ED Medications  ipratropium-albuterol  (DUONEB) 0.5-2.5 (3) MG/3ML nebulizer solution 3 mL (has no administration in time range)    ED Course/ Medical Decision Making/ A&P                                 Medical Decision Making Risk OTC drugs. Prescription drug management.   58 year old female presenting to the ED with shortness of breath.  This is her third visit in as many days for similar.   Afebrile, nontoxic.  Does have some expiratory wheezes but able to walk up and down halls and to bathroom without any distress even without her home O2.  For the past 2 days she has had labs and chest x-ray which were all reassuring aside from alcohol  intoxication.  She openly admits to drinking alcohol .  She remains awake, alert, and conversant.  She seems very much at her  baseline today compared with prior evaluations.  Will give DuoNeb here and reassess.  6:01 AM Patient feeling better now after treatments.  Lung sounds have cleared.  She is tolerating PO.  No vomiting.  Remains ambulatory in the halls without her O2 with no distress.  States she wants a new inhaler and to go home.  I think this is appropriate.  Will have her follow-up with PCP.  Return here for new concerns.  Final Clinical Impression(s) / ED Diagnoses Final diagnoses:  Shortness of breath    Rx / DC Orders ED Discharge Orders     None  Coretha Dew, PA-C 11/28/23 1308    Ballard Bongo, MD 11/29/23 0001

## 2023-11-28 NOTE — ED Notes (Signed)
 PT wont stay in the room or keep cardiac leads/pulse ox or BP cuff on. Instructed PT to keep this on.

## 2023-11-28 NOTE — Discharge Instructions (Signed)
 Use inhaler at home when needed.  Make sure to wear your oxygen . Follow-up with your doctor. Return here for new concerns.

## 2023-11-30 ENCOUNTER — Other Ambulatory Visit: Payer: Self-pay

## 2023-11-30 ENCOUNTER — Emergency Department (HOSPITAL_COMMUNITY)

## 2023-11-30 ENCOUNTER — Emergency Department (HOSPITAL_COMMUNITY)
Admission: EM | Admit: 2023-11-30 | Discharge: 2023-11-30 | Disposition: A | Discharge status: Home / Self Care | Attending: Emergency Medicine | Admitting: Emergency Medicine

## 2023-11-30 ENCOUNTER — Encounter (HOSPITAL_COMMUNITY): Payer: Self-pay | Admitting: *Deleted

## 2023-11-30 DIAGNOSIS — R062 Wheezing: Secondary | ICD-10-CM | POA: Insufficient documentation

## 2023-11-30 DIAGNOSIS — J449 Chronic obstructive pulmonary disease, unspecified: Secondary | ICD-10-CM | POA: Insufficient documentation

## 2023-11-30 DIAGNOSIS — J441 Chronic obstructive pulmonary disease with (acute) exacerbation: Secondary | ICD-10-CM

## 2023-11-30 DIAGNOSIS — Z720 Tobacco use: Secondary | ICD-10-CM

## 2023-11-30 DIAGNOSIS — E119 Type 2 diabetes mellitus without complications: Secondary | ICD-10-CM | POA: Insufficient documentation

## 2023-11-30 DIAGNOSIS — F1410 Cocaine abuse, uncomplicated: Secondary | ICD-10-CM

## 2023-11-30 DIAGNOSIS — R0602 Shortness of breath: Secondary | ICD-10-CM | POA: Insufficient documentation

## 2023-11-30 DIAGNOSIS — I10 Essential (primary) hypertension: Secondary | ICD-10-CM | POA: Insufficient documentation

## 2023-11-30 DIAGNOSIS — Z7951 Long term (current) use of inhaled steroids: Secondary | ICD-10-CM | POA: Insufficient documentation

## 2023-11-30 DIAGNOSIS — R739 Hyperglycemia, unspecified: Secondary | ICD-10-CM

## 2023-11-30 LAB — RESP PANEL BY RT-PCR (RSV, FLU A&B, COVID)  RVPGX2
Influenza A by PCR: NEGATIVE
Influenza B by PCR: NEGATIVE
Resp Syncytial Virus by PCR: NEGATIVE
SARS Coronavirus 2 by RT PCR: NEGATIVE

## 2023-11-30 LAB — I-STAT VENOUS BLOOD GAS, ED
Acid-Base Excess: 0 mmol/L (ref 0.0–2.0)
Bicarbonate: 24.3 mmol/L (ref 20.0–28.0)
Calcium, Ion: 1.08 mmol/L — ABNORMAL LOW (ref 1.15–1.40)
HCT: 32 % — ABNORMAL LOW (ref 36.0–46.0)
Hemoglobin: 10.9 g/dL — ABNORMAL LOW (ref 12.0–15.0)
O2 Saturation: 71 %
Potassium: 4.1 mmol/L (ref 3.5–5.1)
Sodium: 140 mmol/L (ref 135–145)
TCO2: 25 mmol/L (ref 22–32)
pCO2, Ven: 38.2 mmHg — ABNORMAL LOW (ref 44–60)
pH, Ven: 7.412 (ref 7.25–7.43)
pO2, Ven: 36 mmHg (ref 32–45)

## 2023-11-30 LAB — CBC WITH DIFFERENTIAL/PLATELET
Abs Immature Granulocytes: 0 10*3/uL (ref 0.00–0.07)
Basophils Absolute: 0 10*3/uL (ref 0.0–0.1)
Basophils Relative: 1 %
Eosinophils Absolute: 0 10*3/uL (ref 0.0–0.5)
Eosinophils Relative: 0 %
HCT: 31.8 % — ABNORMAL LOW (ref 36.0–46.0)
Hemoglobin: 9.1 g/dL — ABNORMAL LOW (ref 12.0–15.0)
Lymphocytes Relative: 23 %
Lymphs Abs: 1.1 10*3/uL (ref 0.7–4.0)
MCH: 21.7 pg — ABNORMAL LOW (ref 26.0–34.0)
MCHC: 28.6 g/dL — ABNORMAL LOW (ref 30.0–36.0)
MCV: 75.7 fL — ABNORMAL LOW (ref 80.0–100.0)
Monocytes Absolute: 0.3 10*3/uL (ref 0.1–1.0)
Monocytes Relative: 6 %
Neutro Abs: 3.4 10*3/uL (ref 1.7–7.7)
Neutrophils Relative %: 70 %
Platelets: 250 10*3/uL (ref 150–400)
RBC: 4.2 MIL/uL (ref 3.87–5.11)
RDW: 21.6 % — ABNORMAL HIGH (ref 11.5–15.5)
WBC: 4.8 10*3/uL (ref 4.0–10.5)
nRBC: 0 % (ref 0.0–0.2)
nRBC: 0 /100{WBCs}

## 2023-11-30 LAB — COMPREHENSIVE METABOLIC PANEL WITH GFR
ALT: 17 U/L (ref 0–44)
AST: 21 U/L (ref 15–41)
Albumin: 3.4 g/dL — ABNORMAL LOW (ref 3.5–5.0)
Alkaline Phosphatase: 81 U/L (ref 38–126)
Anion gap: 15 (ref 5–15)
BUN: <5 mg/dL — ABNORMAL LOW (ref 6–20)
CO2: 20 mmol/L — ABNORMAL LOW (ref 22–32)
Calcium: 8.7 mg/dL — ABNORMAL LOW (ref 8.9–10.3)
Chloride: 103 mmol/L (ref 98–111)
Creatinine, Ser: 0.4 mg/dL — ABNORMAL LOW (ref 0.44–1.00)
GFR, Estimated: >60 mL/min
Glucose, Bld: 227 mg/dL — ABNORMAL HIGH (ref 70–99)
Potassium: 4 mmol/L (ref 3.5–5.1)
Sodium: 138 mmol/L (ref 135–145)
Total Bilirubin: 0.5 mg/dL (ref 0.0–1.2)
Total Protein: 6.8 g/dL (ref 6.5–8.1)

## 2023-11-30 LAB — RAPID URINE DRUG SCREEN, HOSP PERFORMED
Amphetamines: NOT DETECTED
Barbiturates: POSITIVE — AB
Benzodiazepines: NOT DETECTED
Cocaine: POSITIVE — AB
Opiates: NOT DETECTED
Tetrahydrocannabinol: NOT DETECTED

## 2023-11-30 LAB — TROPONIN I (HIGH SENSITIVITY)
Troponin I (High Sensitivity): 3 ng/L (ref <18)
Troponin I (High Sensitivity): 3 ng/L (ref <18)

## 2023-11-30 LAB — ETHANOL: Alcohol, Ethyl (B): <15 mg/dL

## 2023-11-30 LAB — BRAIN NATRIURETIC PEPTIDE: B Natriuretic Peptide: 19.4 pg/mL (ref 0.0–100.0)

## 2023-11-30 LAB — MAGNESIUM: Magnesium: 2 mg/dL (ref 1.7–2.4)

## 2023-11-30 MED ORDER — ALBUTEROL SULFATE (2.5 MG/3ML) 0.083% IN NEBU
2.5000 mg | INHALATION_SOLUTION | Freq: Once | RESPIRATORY_TRACT | Status: AC
  Administered 2023-11-30: 2.5 mg via RESPIRATORY_TRACT
  Filled 2023-11-30: qty 3

## 2023-11-30 MED ORDER — ALBUTEROL SULFATE HFA 108 (90 BASE) MCG/ACT IN AERS: 2.0000 | INHALATION_SPRAY | RESPIRATORY_TRACT | 1 refills | Status: DC | PRN

## 2023-11-30 MED ORDER — PREDNISONE 20 MG PO TABS
40.0000 mg | ORAL_TABLET | Freq: Once | ORAL | Status: AC
  Administered 2023-11-30: 40 mg via ORAL
  Filled 2023-11-30: qty 2

## 2023-11-30 MED ORDER — PREDNISONE 20 MG PO TABS: 40.0000 mg | ORAL_TABLET | Freq: Every day | ORAL | 0 refills | Status: DC

## 2023-11-30 MED ORDER — DIAZEPAM 2 MG PO TABS
2.0000 mg | ORAL_TABLET | Freq: Once | ORAL | Status: AC
  Administered 2023-11-30: 2 mg via ORAL
  Filled 2023-11-30: qty 1

## 2023-11-30 NOTE — ED Provider Notes (Signed)
 Patient signed out that getting breathing treatment, probable d/c to home.   RN indicates pt is requesting d/c to home.  I rechecked pt - pt continues to request her discharge, states she feels is breathing at baseline, does not want further ED care or treatment or evaluation. Pt denies chest pain or discomfort. No fever or chills. Pt indicates if not discharged soon she will just leave.  On lung exam is moving air well, wheezing improved from prior. Pulse ox 100%. Pt indicates has adequate home o2 and meds. Will d/c patient per her request.   Rec close pcp f/u.  Return precautions provided.    Guadalupe Lee, MD 11/30/23 [phone]

## 2023-11-30 NOTE — ED Notes (Signed)
 RN updated [name] that there are no bus passes in department, [name] stated she will get pt an uber.

## 2023-11-30 NOTE — ED Notes (Signed)
 Patient transported to X-ray

## 2023-11-30 NOTE — Discharge Planning (Signed)
 Pharmacy called related to Rx: ventolin  and if pt could receive generic instead of bread .Aaron AasAaron AasEDCM clarified with EDP  to be sure generic was ok.  Aidyn Sportsman J. Rachel Budds, BSN, RN, NCM  Transitions of Care  Nurse Case Manager  [facility] Emergency Departments  Operative Services  [phone]

## 2023-11-30 NOTE — Discharge Instructions (Addendum)
 It was our pleasure to provide your ER care today - we hope that you feel better.  Take prednisone  as prescribed. Use albuterol  treatment every 3-4 hours as need. Avoid any smoking.   Also avoid drug or alcohol  use as it is harmful to your physical health and mental well-being. See resource guide attached in terms of accessing inpatient or outpatient substance use treatment programs.   Follow up closely with primary care doctor in the coming week. Also follow up with pulmonary specialist in the next couple weeks - call office to arrange appointment.   Return to ER right away if worse, new symptoms, fevers, chest pain, trouble breathing, or other emergency concern.

## 2023-11-30 NOTE — ED Triage Notes (Addendum)
 Pt BIB [facility] from home d/t SOB - stating "I cannot breathe" and Daughter wanted to go d/t pain.  Pt was 88 % on RA.  Pt was given 2 Duonebs by EMS.   BP 130/80 HR 110 88 5 on RA CBG 241

## 2023-11-30 NOTE — ED Provider Notes (Signed)
 [place] EMERGENCY DEPARTMENT AT [facility] Provider Note   CSN: [id number] Arrival date & time: 11/30/23  0531     History  Chief Complaint  Patient presents with   Shortness of Breath    [name] is a 58 y.o. female.   Shortness of Breath Associated symptoms: chest pain   Patient presenting for shortness of breath.  Medical history includes alcohol  abuse, COPD, atrial fibrillation, HTN, DM, polysubstance abuse, seizures, anemia, GERD, PE, schizophrenia, depression, anxiety.  She has been seen in the ED 3 times in the past 4 days for shortness of breath.  During these visits, she would improve after breathing treatments.  She is reportedly on 4 to 5 L of oxygen  at baseline.  Patient had worsening shortness of breath throughout the day today.  She states that her chest has been hurting since yesterday morning as well.  She was given 2 DuoNebs with EMS but denies improvement.     Home Medications Prior to Admission medications   Medication Sig Start Date End Date Taking? Authorizing Provider  albuterol  (VENTOLIN  HFA) 108 (90 Base) MCG/ACT inhaler Inhale 1-2 puffs into the lungs every 6 (six) hours as needed for wheezing or shortness of breath. 10/29/23   Palumbo, April, MD  ARIPiprazole  (ABILIFY ) 10 MG tablet Take 1 tablet (10 mg total) by mouth daily. 11/01/23   Samtani, Jai-Gurmukh, MD  divalproex  (DEPAKOTE  SPRINKLE) 125 MG capsule Take 2 capsules (250 mg total) by mouth every 12 (twelve) hours. 11/14/23   Ghimire, Estil Heman, MD  fluticasone  furoate-vilanterol (BREO ELLIPTA ) 100-25 MCG/ACT AEPB Inhale 1 puff into the lungs daily. 11/14/23   Ghimire, Estil Heman, MD  ipratropium-albuterol  (DUONEB) 0.5-2.5 (3) MG/3ML SOLN Take 3 mLs by nebulization every 6 (six) hours as needed (for shortness of breath or wheezing).    [provider]  levETIRAcetam  (KEPPRA  XR) 500 MG 24 hr tablet Take 1 tablet (500 mg total) by mouth 2 (two) times daily. 10/31/23   Samtani,  Jai-Gurmukh, MD  metFORMIN  (GLUCOPHAGE -XR) 750 MG 24 hr tablet Take 1 tablet (750 mg total) by mouth daily with breakfast. 10/31/23   Samtani, Jai-Gurmukh, MD  OXYGEN  Inhale 4-5 L/min into the lungs as needed (for COPD-related symptoms).    [provider]  predniSONE  (DELTASONE ) 20 MG tablet Take 2 tablets (40 mg total) by mouth daily. 11/27/23   Barrett, Kandace Organ, PA-C  thiamine  (VITAMIN B-1) 100 MG tablet Take 1 tablet (100 mg total) by mouth daily. 11/14/23   Ghimire, Estil Heman, MD  umeclidinium bromide  (INCRUSE ELLIPTA ) 62.5 MCG/ACT AEPB Inhale 1 puff into the lungs daily. 11/14/23   Ghimire, Estil Heman, MD  zonisamide  (ZONEGRAN ) 100 MG capsule Take 2 capsules (200 mg total) by mouth daily. 10/31/23   Samtani, Jai-Gurmukh, MD      Allergies    Iron  dextran and Aspirin     Review of Systems   Review of Systems  Respiratory:  Positive for chest tightness and shortness of breath.   Cardiovascular:  Positive for chest pain.  All other systems reviewed and are negative.   Physical Exam Updated Vital Signs BP 134/75 (BP Location: Right Arm)   Pulse (!) 113   Temp 98.4 F (36.9 C) (Oral)   Resp (!) 22   Ht 5\' 2"  (1.575 m)   Wt 58 kg   LMP 01/08/2011   SpO2 100%   BMI 23.39 kg/m  Physical Exam Vitals and nursing note reviewed.  Constitutional:      General:  She is not in acute distress.    Appearance: She is well-developed. She is not ill-appearing, toxic-appearing or diaphoretic.  HENT:     Head: Normocephalic and atraumatic.     Mouth/Throat:     Mouth: Mucous membranes are moist.  Eyes:     Conjunctiva/sclera: Conjunctivae normal.  Cardiovascular:     Rate and Rhythm: Normal rate and regular rhythm.  Pulmonary:     Effort: Pulmonary effort is normal. Tachypnea present.     Breath sounds: Decreased breath sounds and wheezing present. No rhonchi or rales.  Abdominal:     Palpations: Abdomen is soft.     Tenderness: There is no abdominal tenderness.  Musculoskeletal:         General: No swelling. Normal range of motion.     Cervical back: Normal range of motion and neck supple.  Skin:    General: Skin is warm and dry.     Coloration: Skin is not cyanotic or pale.  Neurological:     General: No focal deficit present.     Mental Status: She is alert and oriented to person, place, and time.  Psychiatric:        Mood and Affect: Mood normal.        Behavior: Behavior normal.     ED Results / Procedures / Treatments   Labs (all labs ordered are listed, but only abnormal results are displayed) Labs Reviewed  CBC WITH DIFFERENTIAL/PLATELET - Abnormal; Notable for the following components:      Result Value   Hemoglobin 9.1 (*)    HCT 31.8 (*)    MCV 75.7 (*)    MCH 21.7 (*)    MCHC 28.6 (*)    RDW 21.6 (*)    All other components within normal limits  I-STAT VENOUS BLOOD GAS, ED - Abnormal; Notable for the following components:   pCO2, Ven 38.2 (*)    Calcium , Ion 1.08 (*)    HCT 32.0 (*)    Hemoglobin 10.9 (*)    All other components within normal limits  RESP PANEL BY RT-PCR (RSV, FLU A&B, COVID)  RVPGX2  COMPREHENSIVE METABOLIC PANEL WITH GFR  BRAIN NATRIURETIC PEPTIDE  MAGNESIUM   RAPID URINE DRUG SCREEN, HOSP PERFORMED  ETHANOL  TROPONIN I (HIGH SENSITIVITY)    EKG None  Radiology DG Chest 1 View Result Date: 11/30/2023 CLINICAL DATA:  Dyspnea. EXAM: CHEST  1 VIEW COMPARISON:  11/27/2023 FINDINGS: Trace atelectasis noted left base. Lungs otherwise clear without focal pneumonia, pulmonary edema, or substantial pleural effusion. The cardiopericardial silhouette is within normal limits for size. No acute bony abnormality. Telemetry leads overlie the chest. IMPRESSION: No active disease. Electronically Signed   By: Donnal Fusi M.D.   On: 11/30/2023 06:34    Procedures Procedures    Medications Ordered in ED Medications  albuterol  (PROVENTIL ) (2.5 MG/3ML) 0.083% nebulizer solution 2.5 mg (2.5 mg Nebulization Given 11/30/23 0559)   predniSONE  (DELTASONE ) tablet 40 mg (40 mg Oral Given 11/30/23 0559)    ED Course/ Medical Decision Making/ A&P                                 Medical Decision Making Amount and/or Complexity of Data Reviewed Labs: ordered. Radiology: ordered.  Risk Prescription drug management.   This patient presents to the ED for concern of shortness of breath, this involves an extensive number of treatment options, and is a complaint that carries  with it a high risk of complications and morbidity.  The differential diagnosis includes COPD exacerbation, ACS, CHF, intoxication, anemia, acidosis, anxiety   Co morbidities / Chronic conditions that complicate the patient evaluation  alcohol  abuse, COPD, atrial fibrillation, HTN, DM, polysubstance abuse, seizures, anemia, GERD, PE, schizophrenia, depression, anxiety   Additional history obtained:  Additional history obtained from EMR External records from outside source obtained and reviewed including N/A   Lab Tests:  I Ordered, and personally interpreted labs.  The pertinent results include: Baseline anemia, no leukocytosis normal blood gas.  Remaining lab work pending at time of signout.   Imaging Studies ordered:  I ordered imaging studies including chest x-ray I independently visualized and interpreted imaging which showed no acute findings I agree with the radiologist interpretation   Cardiac Monitoring: / EKG:  The patient was maintained on a cardiac monitor.  I personally viewed and interpreted the cardiac monitored which showed an underlying rhythm of: Sinus rhythm   Problem List / ED Course / Critical interventions / Medication management  Patient presenting for shortness of breath and chest pain.  She has been seen multiple times recently in the ED for shortness of breath.  Typically, she will receive breathing treatments and discharged home.  She states that she was having some chest pain on her last visit.  This has been  persistent.  She describes it as substernal.  She is overall well-appearing.  She has some diminished lung sounds on auscultation.  EKG is without concerning ST segment changes.  Will check troponins.  She arrives on a DuoNeb that was initiated with EMS.  Patient was ordered additional albuterol .  She has been getting steroids during her recent ED visits.  Dose of prednisone  was ordered to continue treatment of COPD.  Workup was initiated.  Patient's initial lab work shows normal blood gas CBC with baseline anemia present.  Remaining lab work was pending at time of signout.  Care of patient was signed out to oncoming ED provider. I ordered medication including albuterol  and prednisone  Reevaluation of the patient after these medicines showed that the patient COPD I have reviewed the patients home medicines and have made adjustments as needed  Social Determinants of Health:  Frequent ED visits        Final Clinical Impression(s) / ED Diagnoses Final diagnoses:  SOB (shortness of breath)    Rx / DC Orders ED Discharge Orders     None         Iva Mariner, MD 11/30/23 [phone]

## 2023-12-01 ENCOUNTER — Encounter (HOSPITAL_COMMUNITY): Payer: Self-pay

## 2023-12-01 ENCOUNTER — Emergency Department (HOSPITAL_COMMUNITY)
Admission: EM | Admit: 2023-12-01 | Discharge: 2023-12-01 | Disposition: A | Discharge status: Home / Self Care | Source: Home / Self Care | Attending: Emergency Medicine | Admitting: Emergency Medicine

## 2023-12-01 ENCOUNTER — Other Ambulatory Visit: Payer: Self-pay

## 2023-12-01 ENCOUNTER — Inpatient Hospital Stay (HOSPITAL_COMMUNITY)
Admission: EM | Admit: 2023-12-01 | Discharge: 2023-12-13 | DRG: 871 | Disposition: A | Discharge status: Home / Self Care | Attending: Internal Medicine | Admitting: Internal Medicine

## 2023-12-01 ENCOUNTER — Emergency Department (HOSPITAL_COMMUNITY)

## 2023-12-01 DIAGNOSIS — A419 Sepsis, unspecified organism: Principal | ICD-10-CM | POA: Diagnosis present

## 2023-12-01 DIAGNOSIS — F1910 Other psychoactive substance abuse, uncomplicated: Secondary | ICD-10-CM | POA: Diagnosis present

## 2023-12-01 DIAGNOSIS — E43 Unspecified severe protein-calorie malnutrition: Secondary | ICD-10-CM | POA: Diagnosis present

## 2023-12-01 DIAGNOSIS — J9601 Acute respiratory failure with hypoxia: Secondary | ICD-10-CM | POA: Diagnosis present

## 2023-12-01 DIAGNOSIS — I480 Paroxysmal atrial fibrillation: Secondary | ICD-10-CM | POA: Diagnosis present

## 2023-12-01 DIAGNOSIS — Z1152 Encounter for screening for COVID-19: Secondary | ICD-10-CM | POA: Diagnosis not present

## 2023-12-01 DIAGNOSIS — Z7952 Long term (current) use of systemic steroids: Secondary | ICD-10-CM

## 2023-12-01 DIAGNOSIS — Z6821 Body mass index (BMI) 21.0-21.9, adult: Secondary | ICD-10-CM

## 2023-12-01 DIAGNOSIS — F419 Anxiety disorder, unspecified: Secondary | ICD-10-CM | POA: Diagnosis present

## 2023-12-01 DIAGNOSIS — E1165 Type 2 diabetes mellitus with hyperglycemia: Secondary | ICD-10-CM | POA: Diagnosis present

## 2023-12-01 DIAGNOSIS — J690 Pneumonitis due to inhalation of food and vomit: Secondary | ICD-10-CM | POA: Diagnosis present

## 2023-12-01 DIAGNOSIS — Z8249 Family history of ischemic heart disease and other diseases of the circulatory system: Secondary | ICD-10-CM

## 2023-12-01 DIAGNOSIS — E785 Hyperlipidemia, unspecified: Secondary | ICD-10-CM | POA: Diagnosis present

## 2023-12-01 DIAGNOSIS — F17210 Nicotine dependence, cigarettes, uncomplicated: Secondary | ICD-10-CM | POA: Diagnosis present

## 2023-12-01 DIAGNOSIS — G9341 Metabolic encephalopathy: Secondary | ICD-10-CM | POA: Diagnosis present

## 2023-12-01 DIAGNOSIS — T380X5A Adverse effect of glucocorticoids and synthetic analogues, initial encounter: Secondary | ICD-10-CM | POA: Diagnosis present

## 2023-12-01 DIAGNOSIS — F325 Major depressive disorder, single episode, in full remission: Secondary | ICD-10-CM | POA: Diagnosis present

## 2023-12-01 DIAGNOSIS — J918 Pleural effusion in other conditions classified elsewhere: Secondary | ICD-10-CM | POA: Diagnosis present

## 2023-12-01 DIAGNOSIS — Z9981 Dependence on supplemental oxygen: Secondary | ICD-10-CM | POA: Diagnosis not present

## 2023-12-01 DIAGNOSIS — R0602 Shortness of breath: Secondary | ICD-10-CM | POA: Diagnosis present

## 2023-12-01 DIAGNOSIS — E876 Hypokalemia: Secondary | ICD-10-CM | POA: Diagnosis present

## 2023-12-01 DIAGNOSIS — I10 Essential (primary) hypertension: Secondary | ICD-10-CM | POA: Diagnosis present

## 2023-12-01 DIAGNOSIS — G40909 Epilepsy, unspecified, not intractable, without status epilepticus: Secondary | ICD-10-CM | POA: Diagnosis present

## 2023-12-01 DIAGNOSIS — Y906 Blood alcohol level of 120-199 mg/100 ml: Secondary | ICD-10-CM | POA: Diagnosis present

## 2023-12-01 DIAGNOSIS — J189 Pneumonia, unspecified organism: Secondary | ICD-10-CM | POA: Diagnosis present

## 2023-12-01 DIAGNOSIS — Z79899 Other long term (current) drug therapy: Secondary | ICD-10-CM

## 2023-12-01 DIAGNOSIS — R6520 Severe sepsis without septic shock: Secondary | ICD-10-CM | POA: Diagnosis not present

## 2023-12-01 DIAGNOSIS — F1020 Alcohol dependence, uncomplicated: Secondary | ICD-10-CM | POA: Diagnosis not present

## 2023-12-01 DIAGNOSIS — Z91148 Patient's other noncompliance with medication regimen for other reason: Secondary | ICD-10-CM

## 2023-12-01 DIAGNOSIS — Z7984 Long term (current) use of oral hypoglycemic drugs: Secondary | ICD-10-CM

## 2023-12-01 DIAGNOSIS — Z90710 Acquired absence of both cervix and uterus: Secondary | ICD-10-CM

## 2023-12-01 DIAGNOSIS — G4700 Insomnia, unspecified: Secondary | ICD-10-CM | POA: Diagnosis present

## 2023-12-01 DIAGNOSIS — Z560 Unemployment, unspecified: Secondary | ICD-10-CM

## 2023-12-01 DIAGNOSIS — E872 Acidosis, unspecified: Secondary | ICD-10-CM | POA: Diagnosis present

## 2023-12-01 DIAGNOSIS — J90 Pleural effusion, not elsewhere classified: Secondary | ICD-10-CM | POA: Diagnosis not present

## 2023-12-01 DIAGNOSIS — Z781 Physical restraint status: Secondary | ICD-10-CM

## 2023-12-01 DIAGNOSIS — J441 Chronic obstructive pulmonary disease with (acute) exacerbation: Secondary | ICD-10-CM | POA: Diagnosis present

## 2023-12-01 DIAGNOSIS — I472 Ventricular tachycardia, unspecified: Secondary | ICD-10-CM | POA: Diagnosis not present

## 2023-12-01 DIAGNOSIS — D509 Iron deficiency anemia, unspecified: Secondary | ICD-10-CM | POA: Diagnosis present

## 2023-12-01 DIAGNOSIS — F209 Schizophrenia, unspecified: Secondary | ICD-10-CM | POA: Diagnosis present

## 2023-12-01 DIAGNOSIS — R6521 Severe sepsis with septic shock: Secondary | ICD-10-CM | POA: Diagnosis present

## 2023-12-01 DIAGNOSIS — Z880 Allergy status to penicillin: Secondary | ICD-10-CM

## 2023-12-01 DIAGNOSIS — K219 Gastro-esophageal reflux disease without esophagitis: Secondary | ICD-10-CM | POA: Diagnosis present

## 2023-12-01 DIAGNOSIS — F10231 Alcohol dependence with withdrawal delirium: Secondary | ICD-10-CM | POA: Diagnosis present

## 2023-12-01 DIAGNOSIS — F1410 Cocaine abuse, uncomplicated: Secondary | ICD-10-CM | POA: Diagnosis present

## 2023-12-01 DIAGNOSIS — Z825 Family history of asthma and other chronic lower respiratory diseases: Secondary | ICD-10-CM

## 2023-12-01 DIAGNOSIS — F3342 Major depressive disorder, recurrent, in full remission: Secondary | ICD-10-CM | POA: Diagnosis not present

## 2023-12-01 DIAGNOSIS — E1140 Type 2 diabetes mellitus with diabetic neuropathy, unspecified: Secondary | ICD-10-CM | POA: Diagnosis present

## 2023-12-01 DIAGNOSIS — Z886 Allergy status to analgesic agent status: Secondary | ICD-10-CM

## 2023-12-01 DIAGNOSIS — E039 Hypothyroidism, unspecified: Secondary | ICD-10-CM | POA: Diagnosis present

## 2023-12-01 DIAGNOSIS — Z9884 Bariatric surgery status: Secondary | ICD-10-CM

## 2023-12-01 DIAGNOSIS — Z96641 Presence of right artificial hip joint: Secondary | ICD-10-CM | POA: Diagnosis present

## 2023-12-01 DIAGNOSIS — J449 Chronic obstructive pulmonary disease, unspecified: Secondary | ICD-10-CM | POA: Diagnosis not present

## 2023-12-01 DIAGNOSIS — Z9152 Personal history of nonsuicidal self-harm: Secondary | ICD-10-CM

## 2023-12-01 DIAGNOSIS — Z833 Family history of diabetes mellitus: Secondary | ICD-10-CM

## 2023-12-01 LAB — CBC WITH DIFFERENTIAL/PLATELET
Abs Immature Granulocytes: 0.02 10*3/uL (ref 0.00–0.07)
Abs Immature Granulocytes: 0.08 10*3/uL — ABNORMAL HIGH (ref 0.00–0.07)
Basophils Absolute: 0 10*3/uL (ref 0.0–0.1)
Basophils Absolute: 0 10*3/uL (ref 0.0–0.1)
Basophils Relative: 0 %
Basophils Relative: 0 %
Eosinophils Absolute: 0 10*3/uL (ref 0.0–0.5)
Eosinophils Absolute: 0 10*3/uL (ref 0.0–0.5)
Eosinophils Relative: 0 %
Eosinophils Relative: 0 %
HCT: 29 % — ABNORMAL LOW (ref 36.0–46.0)
HCT: 34.3 % — ABNORMAL LOW (ref 36.0–46.0)
Hemoglobin: 8.4 g/dL — ABNORMAL LOW (ref 12.0–15.0)
Hemoglobin: 9.8 g/dL — ABNORMAL LOW (ref 12.0–15.0)
Immature Granulocytes: 0 %
Immature Granulocytes: 1 %
Lymphocytes Relative: 18 %
Lymphocytes Relative: 9 %
Lymphs Abs: 1 10*3/uL (ref 0.7–4.0)
Lymphs Abs: 1.4 10*3/uL (ref 0.7–4.0)
MCH: 21.3 pg — ABNORMAL LOW (ref 26.0–34.0)
MCH: 21.7 pg — ABNORMAL LOW (ref 26.0–34.0)
MCHC: 29 g/dL — ABNORMAL LOW (ref 30.0–36.0)
MCHC: 28.6 g/dL — ABNORMAL LOW (ref 30.0–36.0)
MCV: 73.4 fL — ABNORMAL LOW (ref 80.0–100.0)
MCV: 76.1 fL — ABNORMAL LOW (ref 80.0–100.0)
Monocytes Absolute: 0.6 10*3/uL (ref 0.1–1.0)
Monocytes Absolute: 1.1 10*3/uL — ABNORMAL HIGH (ref 0.1–1.0)
Monocytes Relative: 10 %
Monocytes Relative: 7 %
Neutro Abs: 4.1 10*3/uL (ref 1.7–7.7)
Neutro Abs: 12.9 10*3/uL — ABNORMAL HIGH (ref 1.7–7.7)
Neutrophils Relative %: 72 %
Neutrophils Relative %: 83 %
Platelets: 216 10*3/uL (ref 150–400)
Platelets: 176 10*3/uL (ref 150–400)
RBC: 3.95 MIL/uL (ref 3.87–5.11)
RBC: 4.51 MIL/uL (ref 3.87–5.11)
RDW: 21.2 % — ABNORMAL HIGH (ref 11.5–15.5)
RDW: 21.6 % — ABNORMAL HIGH (ref 11.5–15.5)
Smear Review: ADEQUATE
WBC: 5.8 10*3/uL (ref 4.0–10.5)
WBC: 15.5 10*3/uL — ABNORMAL HIGH (ref 4.0–10.5)
nRBC: 0 % (ref 0.0–0.2)
nRBC: 0 % (ref 0.0–0.2)

## 2023-12-01 LAB — PROTIME-INR
INR: 0.9 (ref 0.8–1.2)
Prothrombin Time: 12.8 s (ref 11.4–15.2)

## 2023-12-01 LAB — RAPID URINE DRUG SCREEN, HOSP PERFORMED
Amphetamines: NOT DETECTED
Barbiturates: POSITIVE — AB
Benzodiazepines: POSITIVE — AB
Cocaine: POSITIVE — AB
Opiates: NOT DETECTED
Tetrahydrocannabinol: POSITIVE — AB

## 2023-12-01 LAB — COMPREHENSIVE METABOLIC PANEL WITH GFR
ALT: 18 U/L (ref 0–44)
AST: 35 U/L (ref 15–41)
Albumin: 3.5 g/dL (ref 3.5–5.0)
Alkaline Phosphatase: 76 U/L (ref 38–126)
Anion gap: 14 (ref 5–15)
BUN: 6 mg/dL (ref 6–20)
CO2: 20 mmol/L — ABNORMAL LOW (ref 22–32)
Calcium: 9 mg/dL (ref 8.9–10.3)
Chloride: 101 mmol/L (ref 98–111)
Creatinine, Ser: 0.58 mg/dL (ref 0.44–1.00)
GFR, Estimated: >60 mL/min (ref >60)
Glucose, Bld: 165 mg/dL — ABNORMAL HIGH (ref 70–99)
Potassium: 4.8 mmol/L (ref 3.5–5.1)
Sodium: 135 mmol/L (ref 135–145)
Total Bilirubin: 0.7 mg/dL (ref 0.0–1.2)
Total Protein: 7 g/dL (ref 6.5–8.1)

## 2023-12-01 LAB — BASIC METABOLIC PANEL WITH GFR
Anion gap: 10 (ref 5–15)
BUN: 6 mg/dL (ref 6–20)
CO2: 22 mmol/L (ref 22–32)
Calcium: 8.1 mg/dL — ABNORMAL LOW (ref 8.9–10.3)
Chloride: 106 mmol/L (ref 98–111)
Creatinine, Ser: 0.59 mg/dL (ref 0.44–1.00)
GFR, Estimated: >60 mL/min (ref >60)
Glucose, Bld: 213 mg/dL — ABNORMAL HIGH (ref 70–99)
Potassium: 4.2 mmol/L (ref 3.5–5.1)
Sodium: 138 mmol/L (ref 135–145)

## 2023-12-01 LAB — TROPONIN I (HIGH SENSITIVITY): Troponin I (High Sensitivity): 3 ng/L (ref <18)

## 2023-12-01 LAB — ETHANOL: Alcohol, Ethyl (B): 132 mg/dL — ABNORMAL HIGH (ref <15)

## 2023-12-01 LAB — I-STAT CG4 LACTIC ACID, ED: Lactic Acid, Venous: 4.4 mmol/L (ref 0.5–1.9)

## 2023-12-01 MED ORDER — SODIUM CHLORIDE 0.9% FLUSH
3.0000 mL | Freq: Two times a day (BID) | INTRAVENOUS | Status: AC
Start: 2023-12-02 — End: ?
  Administered 2023-12-02 – 2023-12-09 (×10): 3 mL via INTRAVENOUS

## 2023-12-01 MED ORDER — SODIUM CHLORIDE 0.9 % IV BOLUS
1000.0000 mL | Freq: Once | INTRAVENOUS | Status: AC
  Administered 2023-12-01: 1000 mL via INTRAVENOUS

## 2023-12-01 MED ORDER — LACTATED RINGERS IV BOLUS (SEPSIS)
1000.0000 mL | Freq: Once | INTRAVENOUS | Status: AC
  Administered 2023-12-01: 1000 mL via INTRAVENOUS

## 2023-12-01 MED ORDER — GUAIFENESIN ER 600 MG PO TB12
600.0000 mg | ORAL_TABLET | Freq: Two times a day (BID) | ORAL | Status: DC
  Administered 2023-12-02 (×3): 600 mg via ORAL
  Filled 2023-12-01 (×4): qty 1

## 2023-12-01 MED ORDER — ACETAMINOPHEN 500 MG PO TABS
1000.0000 mg | ORAL_TABLET | Freq: Four times a day (QID) | ORAL | Status: DC | PRN
  Administered 2023-12-01: 1000 mg via ORAL
  Filled 2023-12-01: qty 2

## 2023-12-01 MED ORDER — ADULT MULTIVITAMIN W/MINERALS CH: 1.0000 | ORAL_TABLET | Freq: Every day | ORAL | Status: DC

## 2023-12-01 MED ORDER — CHLORDIAZEPOXIDE HCL 25 MG PO CAPS
25.0000 mg | ORAL_CAPSULE | Freq: Four times a day (QID) | ORAL | Status: DC
Start: 2023-12-02 — End: 2023-12-03
  Administered 2023-12-02: 25 mg via ORAL
  Filled 2023-12-01: qty 1

## 2023-12-01 MED ORDER — ENOXAPARIN SODIUM 40 MG/0.4ML IJ SOSY
40.0000 mg | PREFILLED_SYRINGE | Freq: Every day | INTRAMUSCULAR | Status: DC
  Administered 2023-12-02 – 2023-12-12 (×11): 40 mg via SUBCUTANEOUS
  Filled 2023-12-01 (×12): qty 0.4

## 2023-12-01 MED ORDER — SENNOSIDES-DOCUSATE SODIUM 8.6-50 MG PO TABS: 1.0000 | ORAL_TABLET | Freq: Every evening | ORAL | Status: DC | PRN

## 2023-12-01 MED ORDER — SODIUM CHLORIDE 0.9 % IV SOLN
2.0000 g | Freq: Once | INTRAVENOUS | Status: AC
  Administered 2023-12-01: 2 g via INTRAVENOUS
  Filled 2023-12-01: qty 20

## 2023-12-01 MED ORDER — LORAZEPAM 2 MG/ML IJ SOLN
1.0000 mg | Freq: Once | INTRAMUSCULAR | Status: AC
  Administered 2023-12-01: 1 mg via INTRAVENOUS
  Filled 2023-12-01: qty 1

## 2023-12-01 MED ORDER — CHLORDIAZEPOXIDE HCL 25 MG PO CAPS: 25.0000 mg | ORAL_CAPSULE | ORAL | Status: DC

## 2023-12-01 MED ORDER — ONDANSETRON HCL 4 MG PO TABS: 4.0000 mg | ORAL_TABLET | Freq: Four times a day (QID) | ORAL | Status: DC | PRN

## 2023-12-01 MED ORDER — ACETAMINOPHEN 650 MG RE SUPP: 650.0000 mg | Freq: Four times a day (QID) | RECTAL | Status: DC | PRN

## 2023-12-01 MED ORDER — ARFORMOTEROL TARTRATE 15 MCG/2ML IN NEBU
15.0000 ug | INHALATION_SOLUTION | Freq: Two times a day (BID) | RESPIRATORY_TRACT | Status: DC
  Administered 2023-12-02 – 2023-12-13 (×22): 15 ug via RESPIRATORY_TRACT
  Filled 2023-12-01 (×25): qty 2

## 2023-12-01 MED ORDER — LORAZEPAM 2 MG/ML IJ SOLN
1.0000 mg | INTRAMUSCULAR | Status: DC | PRN
  Administered 2023-12-02: 1 mg via INTRAVENOUS
  Administered 2023-12-02: 2 mg via INTRAVENOUS
  Filled 2023-12-01 (×2): qty 1

## 2023-12-01 MED ORDER — IPRATROPIUM-ALBUTEROL 0.5-2.5 (3) MG/3ML IN SOLN
3.0000 mL | Freq: Once | RESPIRATORY_TRACT | Status: AC
  Administered 2023-12-01: 3 mL via RESPIRATORY_TRACT
  Filled 2023-12-01: qty 3

## 2023-12-01 MED ORDER — THIAMINE HCL 100 MG/ML IJ SOLN: 100.0000 mg | Freq: Every day | INTRAMUSCULAR | Status: DC

## 2023-12-01 MED ORDER — THIAMINE MONONITRATE 100 MG PO TABS: 100.0000 mg | ORAL_TABLET | Freq: Every day | ORAL | Status: DC

## 2023-12-01 MED ORDER — LOPERAMIDE HCL 2 MG PO CAPS: 2.0000 mg | ORAL_CAPSULE | ORAL | Status: AC | PRN

## 2023-12-01 MED ORDER — ONDANSETRON HCL 4 MG/2ML IJ SOLN
4.0000 mg | Freq: Four times a day (QID) | INTRAMUSCULAR | Status: DC | PRN
Start: 2023-12-01 — End: 2023-12-11
  Administered 2023-12-06 – 2023-12-09 (×2): 4 mg via INTRAVENOUS
  Filled 2023-12-01 (×2): qty 2

## 2023-12-01 MED ORDER — NICOTINE 21 MG/24HR TD PT24
21.0000 mg | MEDICATED_PATCH | Freq: Every day | TRANSDERMAL | Status: DC
  Administered 2023-12-02 – 2023-12-12 (×12): 21 mg via TRANSDERMAL
  Filled 2023-12-01 (×13): qty 1

## 2023-12-01 MED ORDER — IPRATROPIUM-ALBUTEROL 0.5-2.5 (3) MG/3ML IN SOLN
3.0000 mL | Freq: Four times a day (QID) | RESPIRATORY_TRACT | Status: DC | PRN
  Administered 2023-12-02 – 2023-12-09 (×2): 3 mL via RESPIRATORY_TRACT
  Filled 2023-12-01 (×2): qty 3

## 2023-12-01 MED ORDER — LORAZEPAM 1 MG PO TABS: 2.0000 mg | ORAL_TABLET | Freq: Once | ORAL | Status: DC

## 2023-12-01 MED ORDER — BUDESONIDE 0.25 MG/2ML IN SUSP
0.2500 mg | Freq: Two times a day (BID) | RESPIRATORY_TRACT | Status: DC
  Administered 2023-12-02 – 2023-12-13 (×24): 0.25 mg via RESPIRATORY_TRACT
  Filled 2023-12-01 (×25): qty 2

## 2023-12-01 MED ORDER — FOLIC ACID 1 MG PO TABS
1.0000 mg | ORAL_TABLET | Freq: Every day | ORAL | Status: DC
  Administered 2023-12-02: 1 mg via ORAL
  Filled 2023-12-01: qty 1

## 2023-12-01 MED ORDER — LACTATED RINGERS IV SOLN: INTRAVENOUS | Status: AC

## 2023-12-01 MED ORDER — LORAZEPAM 1 MG PO TABS: 1.0000 mg | ORAL_TABLET | ORAL | Status: DC | PRN

## 2023-12-01 MED ORDER — CHLORDIAZEPOXIDE HCL 25 MG PO CAPS
25.0000 mg | ORAL_CAPSULE | Freq: Every day | ORAL | Status: DC
Start: 2023-12-05 — End: 2023-12-06

## 2023-12-01 MED ORDER — ACETAMINOPHEN 325 MG PO TABS
650.0000 mg | ORAL_TABLET | Freq: Four times a day (QID) | ORAL | Status: DC | PRN
Start: 2023-12-01 — End: 2023-12-02

## 2023-12-01 MED ORDER — SODIUM CHLORIDE 0.9 % IV SOLN
500.0000 mg | Freq: Once | INTRAVENOUS | Status: AC
  Administered 2023-12-01: 500 mg via INTRAVENOUS
  Filled 2023-12-01: qty 5

## 2023-12-01 MED ORDER — CHLORDIAZEPOXIDE HCL 25 MG PO CAPS: 25.0000 mg | ORAL_CAPSULE | Freq: Three times a day (TID) | ORAL | Status: DC

## 2023-12-01 NOTE — ED Provider Notes (Signed)
 [place] EMERGENCY DEPARTMENT AT [facility] Provider Note   CSN: [id number] Arrival date & time: 12/01/23  0442     History Chief Complaint  Patient presents with   Shortness of Breath    HPI [name] is a 58 y.o. female presenting for acute on chronic shortness of breath.  Multiple visits for the same recently.  States that she started having chest pain shortness of breath this evening.  Interestingly, patient is disoriented.  Does not know what day it is, what year it is or what time it is. She gets her name correct. Longstanding history of cocaine use, does not answer my question directly when asked about current utilization. Denies fevers chills nausea vomit syncope.  Patient's recorded medical, surgical, social, medication list and allergies were reviewed in the Snapshot window as part of the initial history.   Review of Systems   Review of Systems  Constitutional:  Negative for chills and fever.  HENT:  Negative for ear pain and sore throat.   Eyes:  Negative for pain and visual disturbance.  Respiratory:  Positive for shortness of breath. Negative for cough.   Cardiovascular:  Positive for chest pain. Negative for palpitations.  Gastrointestinal:  Negative for abdominal pain and vomiting.  Genitourinary:  Negative for dysuria and hematuria.  Musculoskeletal:  Negative for arthralgias and back pain.  Skin:  Negative for color change and rash.  Neurological:  Negative for seizures and syncope.  All other systems reviewed and are negative.   Physical Exam Updated Vital Signs BP (!) 143/77   Pulse (!) 120   Temp 97.9 F (36.6 C) (Oral)   Resp (!) 22   Ht 5\' 2"  (1.575 m)   Wt 58 kg   LMP 01/08/2011   SpO2 97%   BMI 23.39 kg/m  Physical Exam Vitals and nursing note reviewed.  Constitutional:      General: She is not in acute distress.    Appearance: She is well-developed. She is not ill-appearing or toxic-appearing.  HENT:     Head:  Normocephalic and atraumatic.  Eyes:     Extraocular Movements: Extraocular movements intact.     Conjunctiva/sclera: Conjunctivae normal.     Pupils: Pupils are equal, round, and reactive to light.  Cardiovascular:     Rate and Rhythm: Normal rate and regular rhythm.     Heart sounds: No murmur heard. Pulmonary:     Effort: Pulmonary effort is normal. No respiratory distress.     Breath sounds: Normal breath sounds.  Abdominal:     General: Abdomen is flat. There is no distension.     Palpations: Abdomen is soft.     Tenderness: There is no abdominal tenderness. There is no right CVA tenderness or left CVA tenderness.  Musculoskeletal:        General: No swelling, tenderness, deformity or signs of injury. Normal range of motion.     Cervical back: Normal range of motion and neck supple. No rigidity.  Skin:    General: Skin is warm and dry.  Neurological:     General: No focal deficit present.     Mental Status: She is alert and oriented to person, place, and time. Mental status is at baseline.     Cranial Nerves: No cranial nerve deficit.  Psychiatric:        Mood and Affect: Mood normal.      ED Course/ Medical Decision Making/ A&P    Procedures .Critical Care  Performed by:  Onetha Bile, MD Authorized by: Onetha Bile, MD   Critical care provider statement:    Critical care time (minutes):  30   Critical care was necessary to treat or prevent imminent or life-threatening deterioration of the following conditions:  Toxidrome   Critical care was time spent personally by me on the following activities:  Development of treatment plan with patient or surrogate, discussions with consultants, evaluation of patient's response to treatment, examination of patient, ordering and review of laboratory studies, ordering and review of radiographic studies, ordering and performing treatments and interventions, pulse oximetry, re-evaluation of patient's condition and review of  old charts    Medications Ordered in ED Medications  ipratropium-albuterol  (DUONEB) 0.5-2.5 (3) MG/3ML nebulizer solution 3 mL (3 mLs Nebulization Given 12/01/23 0522)  LORazepam  (ATIVAN ) injection 1 mg (1 mg Intravenous Given 12/01/23 0530)    Medical Decision Making:   [name] is a 58 y.o. female who presented to the ED today with bizarre behavior, multiple complaints detailed above.    Handoff received from EMS.  Patient placed on continuous vitals and telemetry monitoring while in ED which was reviewed periodically.  Complete initial physical exam performed, notably the patient  was very pressured in her speech, tachycardic, hypertensive. Reviewed and confirmed nursing documentation for past medical history, family history, social history.    Initial Assessment:   This is most consistent with an acute life/limb threatening illness complicated by underlying chronic conditions. 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  Alcohol  withdrawals could be similar but patient denies history of alcohol  use. She is endorsing shortness of breath but I feel this is likely subacute given her multiple prior visits for similar.  Will evaluate for underlying disease treat for COPD exacerbation bronchodilators while treating the sympathomimetic toxidrome with Ativan . ACS favored less likely given chronic nature of symptoms will evaluate with EKG/troponin as below Initial Plan:  Screening labs including CBC and Metabolic panel to evaluate for infectious or metabolic etiology of disease.  Urinalysis with reflex culture ordered to evaluate for UTI or relevant urologic/nephrologic pathology.  CXR to evaluate for structural/infectious intrathoracic pathology.  EKG and single  troponin to evaluate for cardiac pathology. Single troponin appropriate due to greater than 6 hours since maximal intensity of symptoms. Objective evaluation as below reviewed   Initial Study Results:   Laboratory  All laboratory results reviewed without evidence of clinically relevant pathology.    EKG EKG was reviewed independently. Rate, rhythm, axis, intervals all examined and without medically relevant abnormality. ST segments without concerns for elevations.    Radiology:  All images reviewed independently. Agree with radiology report at this time.   DG Chest 1 View Result Date: 11/30/2023 CLINICAL DATA:  Dyspnea. EXAM: CHEST  1 VIEW COMPARISON:  11/27/2023 FINDINGS: Trace atelectasis noted left base. Lungs otherwise clear without focal pneumonia, pulmonary edema, or substantial pleural effusion. The cardiopericardial silhouette is within normal limits for size. No acute bony abnormality. Telemetry leads overlie the chest. IMPRESSION: No active disease. Electronically Signed   By: Donnal Fusi M.D.   On: 11/30/2023 06:34   DG Chest 1 View Result Date: 11/27/2023 CLINICAL DATA:  Shortness of breath. EXAM: CHEST  1 VIEW COMPARISON:  11/26/2023 FINDINGS: Heart size and mediastinal contours are normal. There is no pleural fluid, interstitial edema or airspace disease.  Visualized osseous structures are unremarkable. IMPRESSION: No active disease. Electronically Signed   By: Kimberley Penman M.D.   On: 11/27/2023 07:18   DG Chest Port 1 View Result Date: 11/26/2023 EXAM: 1 VIEW XRAY OF THE CHEST 11/26/2023 04:51:00 AM COMPARISON: 1 view chest x-ray 11/21/2023 CLINICAL HISTORY: Shortness of breath, copd; FINDINGS: LUNGS AND PLEURA: Lung volumes are low. No focal pulmonary opacity. No pulmonary edema. No pleural effusion. No pneumothorax. HEART AND MEDIASTINUM: No acute abnormality of the cardiac and mediastinal silhouettes. BONES AND SOFT TISSUES: No acute osseous abnormality. IMPRESSION: 1. No acute  cardiopulmonary pathology. Electronically signed by: Audree Leas MD 11/26/2023 05:16 AM EDT RP Workstation: [id number]   DG Chest Port 1 View Result Date: 11/21/2023 EXAM: 1 VIEW(S) XRAY OF THE CHEST 11/21/2023 03:51:00 AM COMPARISON: 11/18/2023 . CLINICAL HISTORY: Shortness of breath. Reason for exam: shortness of breath. FINDINGS: LUNGS AND PLEURA: No focal pulmonary opacity. No pulmonary edema. No pleural effusion. No pneumothorax. HEART AND MEDIASTINUM: No acute abnormality of the cardiac and mediastinal silhouettes. BONES AND SOFT TISSUES: No acute osseous abnormality. IMPRESSION: 1. No acute process. Electronically signed by: Zadie Herter MD 11/21/2023 04:02 AM EDT RP Workstation: [id number]   DG Chest Portable 1 View Result Date: 11/18/2023 CLINICAL DATA:  Dyspnea and shortness of breath. EXAM: PORTABLE CHEST 1 VIEW COMPARISON:  Nov 11, 2023 FINDINGS: The heart size and mediastinal contours are within normal limits. Low lung volumes are noted without evidence of an acute infiltrate, pleural effusion or pneumothorax. Multilevel degenerative changes are seen throughout the thoracic spine. IMPRESSION: Low lung volumes without evidence of acute or active cardiopulmonary disease. Electronically Signed   By: Virgle Grime M.D.   On: 11/18/2023 02:31   CT ABDOMEN PELVIS W CONTRAST Result Date: 11/16/2023 CLINICAL DATA:  Pain. EXAM: CT ANGIOGRAPHY CHEST CT ABDOMEN AND PELVIS WITH CONTRAST TECHNIQUE: Multidetector CT imaging of the chest was performed using the standard protocol during bolus administration of intravenous contrast. Multiplanar CT image reconstructions and MIPs were obtained to evaluate the vascular anatomy. Multidetector CT imaging of the abdomen and pelvis was performed using the standard protocol during bolus administration of intravenous contrast. RADIATION DOSE REDUCTION: This exam was performed according to the departmental dose-optimization program which includes  automated exposure control, adjustment of the mA and/or kV according to patient size and/or use of iterative reconstruction technique. CONTRAST:  75mL OMNIPAQUE  IOHEXOL  350 MG/ML SOLN COMPARISON:  06/28/2023. FINDINGS: CTA CHEST FINDINGS Cardiovascular: Satisfactory opacification of the pulmonary arteries to the segmental level. No evidence of pulmonary embolism. Normal heart size. No pericardial effusion. Mediastinum/Nodes: Bilateral thyroid  nodules. This can be evaluated with ultrasound. No suspicious mediastinal, hilar or axillary adenopathy. Lungs/Pleura: Dependent bibasilar subsegmental atelectasis. No pleural or pericardial effusions. Lungs otherwise clear. Musculoskeletal: Sclerotic changes and compression deformities of T4, T6 and T9 are stable findings. Review of the MIP images confirms the above findings. CT ABDOMEN and PELVIS FINDINGS Hepatobiliary: No focal liver abnormality is seen. No gallstones, gallbladder wall thickening, or biliary dilatation. Pancreas: Unremarkable. No pancreatic ductal dilatation or surrounding inflammatory changes. Spleen: Normal in size without focal abnormality. Adrenals/Urinary Tract: Adrenal glands are unremarkable. Kidneys are normal, without renal calculi, focal lesion, or hydronephrosis. Bladder is not well visualized due to artifact from a right hip prosthesis. Stomach/Bowel: Postop changes in the stomach. Small hiatal hernia. Appendix appears normal. No evidence of bowel wall thickening, distention, or inflammatory changes. Vascular/Lymphatic: Aortic atherosclerosis. No enlarged abdominal or pelvic lymph nodes. Reproductive: Status post hysterectomy. No adnexal masses. Other: Rectus  abdominus diastasis with loops of bowel protruding to the cutaneous surface of the midline lower abdomen. Musculoskeletal: Right hip prosthesis. Thoracolumbar degenerative changes. Review of the MIP images confirms the above findings. IMPRESSION: 1. No evidence of PE. 2. Bilateral thyroid   nodules. This can be evaluated with ultrasound. 3. No acute abdominal or pelvic pathology identified. 4. Aortic atherosclerosis (ICD10-I70.0). Electronically Signed   By: Sydell Eva M.D.   On: 11/16/2023 10:24   CT Angio Chest PE W/Cm &/Or Wo Cm Result Date: 11/16/2023 CLINICAL DATA:  Pain. EXAM: CT ANGIOGRAPHY CHEST CT ABDOMEN AND PELVIS WITH CONTRAST TECHNIQUE: Multidetector CT imaging of the chest was performed using the standard protocol during bolus administration of intravenous contrast. Multiplanar CT image reconstructions and MIPs were obtained to evaluate the vascular anatomy. Multidetector CT imaging of the abdomen and pelvis was performed using the standard protocol during bolus administration of intravenous contrast. RADIATION DOSE REDUCTION: This exam was performed according to the departmental dose-optimization program which includes automated exposure control, adjustment of the mA and/or kV according to patient size and/or use of iterative reconstruction technique. CONTRAST:  75mL OMNIPAQUE  IOHEXOL  350 MG/ML SOLN COMPARISON:  06/28/2023. FINDINGS: CTA CHEST FINDINGS Cardiovascular: Satisfactory opacification of the pulmonary arteries to the segmental level. No evidence of pulmonary embolism. Normal heart size. No pericardial effusion. Mediastinum/Nodes: Bilateral thyroid  nodules. This can be evaluated with ultrasound. No suspicious mediastinal, hilar or axillary adenopathy. Lungs/Pleura: Dependent bibasilar subsegmental atelectasis. No pleural or pericardial effusions. Lungs otherwise clear. Musculoskeletal: Sclerotic changes and compression deformities of T4, T6 and T9 are stable findings. Review of the MIP images confirms the above findings. CT ABDOMEN and PELVIS FINDINGS Hepatobiliary: No focal liver abnormality is seen. No gallstones, gallbladder wall thickening, or biliary dilatation. Pancreas: Unremarkable. No pancreatic ductal dilatation or surrounding inflammatory changes. Spleen:  Normal in size without focal abnormality. Adrenals/Urinary Tract: Adrenal glands are unremarkable. Kidneys are normal, without renal calculi, focal lesion, or hydronephrosis. Bladder is not well visualized due to artifact from a right hip prosthesis. Stomach/Bowel: Postop changes in the stomach. Small hiatal hernia. Appendix appears normal. No evidence of bowel wall thickening, distention, or inflammatory changes. Vascular/Lymphatic: Aortic atherosclerosis. No enlarged abdominal or pelvic lymph nodes. Reproductive: Status post hysterectomy. No adnexal masses. 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 Musculoskeletal: Right hip prosthesis. Thoracolumbar degenerative changes. Review of the MIP images confirms the above findings. IMPRESSION: 1. No evidence of PE. 2. Bilateral thyroid  nodules. This can be evaluated with ultrasound. 3. No acute abdominal or pelvic pathology identified. 4. Aortic atherosclerosis (ICD10-I70.0). Electronically Signed   By: Sydell Eva M.D.   On: 11/16/2023 10:24   US  Abdomen Limited RUQ (LIVER/GB) Result Date: 11/16/2023 CLINICAL DATA:  151471 RUQ pain 151471 EXAM: ULTRASOUND ABDOMEN LIMITED COMPARISON:  None Available. FINDINGS: The liver demonstrates normal parenchymal echogenicity and homogeneous texture without focal hepatic parenchymal lesions or intrahepatic ductal dilatation. Hepatopetal portal vein flow. The gallbladder demonstrates no stones, wall thickening or pericholecystic fluid. CBD measured 0.5cm. IMPRESSION: Unremarkable examination of the right upper quadrant. Electronically Signed   By: Sydell Eva M.D.   On: 11/16/2023 08:22   DG Chest Port 1 View Result Date: 11/11/2023 CLINICAL DATA:  Hypoxia, wheezing EXAM: PORTABLE CHEST 1 VIEW COMPARISON:  11/10/2023 FINDINGS: Stable right basilar atelectasis. Lungs are otherwise clear. No pneumothorax or pleural effusion. Cardiac size  within normal limits. Pulmonary vascularity is normal. No acute bone abnormality. IMPRESSION: 1. Right basilar atelectasis. Electronically  Signed   By: Worthy Heads M.D.   On: 11/11/2023 02:51   DG Chest Port 1 View Result Date: 11/10/2023 CLINICAL DATA:  10031 Cough 10031 EXAM: PORTABLE CHEST 1 VIEW COMPARISON:  Chest x-ray 11/04/2023, CT chest 06/28/2023 FINDINGS: Patient is rotated. The heart and mediastinal contours are unchanged. Atherosclerotic plaque. Low lung volumes. No focal consolidation. No pulmonary edema. No pleural effusion. No pneumothorax. No acute osseous abnormality. IMPRESSION: Low lung volumes with no active disease. Patient is rotated. Recommend repeat PA and lateral view of the chest for further evaluation. Electronically Signed   By: Morgane  Naveau M.D.   On: 11/10/2023 00:29   UE Venous Duplex ([facility] and [facility] ONLY) Result Date: 11/04/2023 UPPER VENOUS STUDY  Patient Name:  [name]  Date of Exam:   11/04/2023 Medical Rec #: [id number]       Accession #:    [id number] Date of Birth: 10-Dec-1965      Patient Gender: F Patient Age:   76 years Exam Location:  [facility] Procedure:      VAS US  UPPER EXTREMITY VENOUS DUPLEX Referring Phys: Rosealee Concha --------------------------------------------------------------------------------  Indications: Pain, and Swelling Limitations: Patient movement. Comparison Study: No previous exams Performing Technologist: Jody Hill RVT, RDMS  Examination Guidelines: A complete evaluation includes B-mode imaging, spectral Doppler, color Doppler, and power Doppler as needed of all accessible portions of each vessel. Bilateral testing is considered an integral part of a complete examination. Limited examinations for reoccurring indications may be performed as noted.  Right Findings: +----------+------------+---------+-----------+----------+--------------------+ RIGHT     CompressiblePhasicitySpontaneousProperties      Summary         +----------+------------+---------+-----------+----------+--------------------+ Subclavian               Yes       Yes                   patent by                                                              color/doppler     +----------+------------+---------+-----------+----------+--------------------+  Left Findings: +----------+------------+---------+-----------+----------+--------------+ LEFT      CompressiblePhasicitySpontaneousProperties   Summary     +----------+------------+---------+-----------+----------+--------------+ IJV           Full       Yes       Yes                             +----------+------------+---------+-----------+----------+--------------+ Subclavian    Full       Yes       Yes                             +----------+------------+---------+-----------+----------+--------------+ Axillary      Full       Yes       Yes                             +----------+------------+---------+-----------+----------+--------------+ Brachial      Full       Yes       Yes                             +----------+------------+---------+-----------+----------+--------------+  Radial        Full                                                 +----------+------------+---------+-----------+----------+--------------+ Ulnar         None       No        No                   Acute      +----------+------------+---------+-----------+----------+--------------+ Cephalic                                            Not visualized +----------+------------+---------+-----------+----------+--------------+ Basilic       Full       Yes       Yes                             +----------+------------+---------+-----------+----------+--------------+  Summary:  Right: No evidence of thrombosis in the subclavian.  Left: No evidence of superficial vein thrombosis in the upper extremity. Findings consistent with acute deep vein thrombosis involving the left  ulnar veins.  *See table(s) above for measurements and observations.  Diagnosing physician: Jimmye Moulds MD Electronically signed by Jimmye Moulds MD on 11/04/2023 at 2:17:10 PM.    Final    DG Chest Portable 1 View Result Date: 11/04/2023 CLINICAL DATA:  Shortness of breath. EXAM: PORTABLE CHEST 1 VIEW COMPARISON:  AP Lat chest yesterday at 5:50 a.m. FINDINGS: 4:29 a.m. Stable mild cardiomegaly. Central vessels are borderline prominent. No edema is seen. Increased linear atelectasis noted right lung base. Relatively lower inspiration today but no focal pneumonia is evident. The sulci are sharp. The mediastinum is normally outlined. No new osseous abnormality. Telemetry leads overlie both sides of the chest. IMPRESSION: 1. Stable mild cardiomegaly. Borderline central vascular prominence without edema. 2. Increased linear atelectasis right lung base. Electronically Signed   By: Denman Fischer M.D.   On: 11/04/2023 05:06   DG Chest 2 View Result Date: 11/03/2023 CLINICAL DATA:  58 year old female with chest pain and shortness of breath. EXAM: CHEST - 2 VIEW COMPARISON:  Portable chest 10/30/2023 and earlier. FINDINGS: AP and lateral views 0549 hours. Lung volumes are stable. Mediastinal contours remain normal. Visualized tracheal air column is within normal limits. No pneumothorax, pulmonary edema, pleural effusion or consolidation. No confluent lung opacity. Chronic thoracic compression fractures better demonstrated by CT in January. No acute osseous abnormality identified. Negative visible bowel gas. IMPRESSION: 1.  No acute cardiopulmonary abnormality. 2. Chronic thoracic compression fractures. Electronically Signed   By: Marlise Simpers M.D.   On: 11/03/2023 06:12   Reassessment and Plan:   Patient observed in the ER for 2 and half hours.  Toxidrome improved after administration of benzodiazepine. Patient is ambulatory tolerating p.o. intake on reassessment stable for outpatient care management.  No  acute distress, oxygenation normal.   Disposition:  I have considered need for hospitalization, however, considering all of the above, I believe this patient is stable for discharge at this time.  Patient/family educated about specific return precautions for given chief complaint and symptoms.  Patient/family educated about follow-up with PCP.     Patient/family expressed understanding of return  precautions and need for follow-up. Patient spoken to regarding all imaging and laboratory results and appropriate follow up for these results. All education provided in verbal form with additional information in written form. Time was allowed for answering of patient questions. Patient discharged.    Emergency Department Medication Summary:   Medications  ipratropium-albuterol  (DUONEB) 0.5-2.5 (3) MG/3ML nebulizer solution 3 mL (3 mLs Nebulization Given 12/01/23 0522)  LORazepam  (ATIVAN ) injection 1 mg (1 mg Intravenous Given 12/01/23 0530)         Clinical Impression:  1. COPD exacerbation [facility])      Discharge   Final Clinical Impression(s) / ED Diagnoses Final diagnoses:  COPD exacerbation [facility])    Rx / DC Orders ED Discharge Orders     None         Onetha Bile, MD 12/01/23 [id number]

## 2023-12-01 NOTE — ED Provider Notes (Signed)
 [place] EMERGENCY DEPARTMENT AT [facility] Provider Note   CSN: [id number] Arrival date & time: 12/01/23  2040     History  Chief Complaint  Patient presents with   RIB CAGE PAIN    THURSA EMME is a 58 y.o. female.  HPI Patient presents with shortness of breath right rib pain.  Has had 29 visits over the last 6 months for usually shortness of breath.  This is her fourth visit in the last 4 days including once earlier today.  Earlier today complaining shortness of breath.  Now however having fever.  Still complaining of the same right chest pain she been having previously.  Somewhat difficult to get history from.  Reviewing notes does have history of substance abuse.    Home Medications Prior to Admission medications   Medication Sig Start Date End Date Taking? Authorizing Provider  albuterol  (VENTOLIN  HFA) 108 (90 Base) MCG/ACT inhaler Inhale 1-2 puffs into the lungs every 6 (six) hours as needed for wheezing or shortness of breath. 10/29/23   Palumbo, April, MD  albuterol  (VENTOLIN  HFA) 108 (90 Base) MCG/ACT inhaler Inhale 2 puffs into the lungs every 4 (four) hours as needed for wheezing or shortness of breath. 11/30/23   Steinl, Kevin, MD  ARIPiprazole  (ABILIFY ) 10 MG tablet Take 1 tablet (10 mg total) by mouth daily. 11/01/23   Samtani, Jai-Gurmukh, MD  divalproex  (DEPAKOTE  SPRINKLE) 125 MG capsule Take 2 capsules (250 mg total) by mouth every 12 (twelve) hours. 11/14/23   Ghimire, Estil Heman, MD  fluticasone  furoate-vilanterol (BREO ELLIPTA ) 100-25 MCG/ACT AEPB Inhale 1 puff into the lungs daily. 11/14/23   Ghimire, Estil Heman, MD  ipratropium-albuterol  (DUONEB) 0.5-2.5 (3) MG/3ML SOLN Take 3 mLs by nebulization every 6 (six) hours as needed (for shortness of breath or wheezing).    [provider]  levETIRAcetam  (KEPPRA  XR) 500 MG 24 hr tablet Take 1 tablet (500 mg total) by mouth 2 (two) times daily. 10/31/23   Samtani, Jai-Gurmukh, MD  metFORMIN  (GLUCOPHAGE -XR)  750 MG 24 hr tablet Take 1 tablet (750 mg total) by mouth daily with breakfast. 10/31/23   Samtani, Jai-Gurmukh, MD  OXYGEN  Inhale 4-5 L/min into the lungs as needed (for COPD-related symptoms).    [provider]  predniSONE  (DELTASONE ) 20 MG tablet Take 2 tablets (40 mg total) by mouth daily. 12/01/23   Guadalupe Lee, MD  thiamine  (VITAMIN B-1) 100 MG tablet Take 1 tablet (100 mg total) by mouth daily. 11/14/23   Ghimire, Estil Heman, MD  umeclidinium bromide  (INCRUSE ELLIPTA ) 62.5 MCG/ACT AEPB Inhale 1 puff into the lungs daily. 11/14/23   Ghimire, Estil Heman, MD  zonisamide  (ZONEGRAN ) 100 MG capsule Take 2 capsules (200 mg total) by mouth daily. 10/31/23   Samtani, Jai-Gurmukh, MD      Allergies    Iron  dextran and Aspirin     Review of Systems   Review of Systems  Physical Exam Updated Vital Signs BP 131/77   Pulse (!) 130   Temp 99.9 F (37.7 C) (Oral)   Resp (!) 25   Ht 5\' 2"  (1.575 m)   Wt 58 kg   LMP 01/08/2011   SpO2 93%   BMI 23.39 kg/m  Physical Exam Vitals and nursing note reviewed.  HENT:     Head: Atraumatic.  Cardiovascular:     Rate and Rhythm: Tachycardia present.  Pulmonary:     Comments: Tenderness right lateral chest wall. Chest:     Chest wall: Tenderness present.  Musculoskeletal:  Right lower leg: No edema.     Left lower leg: No edema.  Skin:    Capillary Refill: Capillary refill takes less than 2 seconds.  Neurological:     Mental Status: She is alert.     Comments: Awake with some confusion.     ED Results / Procedures / Treatments   Labs (all labs ordered are listed, but only abnormal results are displayed) Labs Reviewed  CBC WITH DIFFERENTIAL/PLATELET - Abnormal; Notable for the following components:      Result Value   WBC 15.5 (*)    Hemoglobin 9.8 (*)    HCT 34.3 (*)    MCV 76.1 (*)    MCH 21.7 (*)    MCHC 28.6 (*)    RDW 21.6 (*)    Neutro Abs 12.9 (*)    Monocytes Absolute 1.1 (*)    Abs Immature Granulocytes 0.08 (*)     All other components within normal limits  COMPREHENSIVE METABOLIC PANEL WITH GFR - Abnormal; Notable for the following components:   CO2 20 (*)    Glucose, Bld 165 (*)    All other components within normal limits  ETHANOL - Abnormal; Notable for the following components:   Alcohol , Ethyl (B) 132 (*)    All other components within normal limits  I-STAT CG4 LACTIC ACID, ED - Abnormal; Notable for the following components:   Lactic Acid, Venous 4.4 (*)    All other components within normal limits  CULTURE, BLOOD (ROUTINE X 2)  CULTURE, BLOOD (ROUTINE X 2)  PROTIME-INR  URINALYSIS, W/ REFLEX TO CULTURE (INFECTION SUSPECTED)  I-STAT CG4 LACTIC ACID, ED    EKG EKG Interpretation Date/Time:  Tuesday December 01 2023 20:52:17 EDT Ventricular Rate:  139 PR Interval:  112 QRS Duration:  84 QT Interval:  272 QTC Calculation: 414 R Axis:   32  Text Interpretation: Sinus tachycardia Left atrial enlargement Confirmed by Mozell Arias [phone]) on 12/01/2023 10:06:35 PM  Radiology DG Chest Portable 1 View Result Date: 12/01/2023 CLINICAL DATA:  Cough and fever EXAM: PORTABLE CHEST 1 VIEW COMPARISON:  Chest radiograph dated 11/30/2023 FINDINGS: Low lung volumes with bronchovascular crowding. New confluent right lower lobe opacity. No pleural effusion or pneumothorax. The heart size and mediastinal contours are within normal limits. No acute osseous abnormality. IMPRESSION: New confluent right lower lobe opacity, suspicious for pneumonia. Electronically Signed   By: Limin  Xu M.D.   On: 12/01/2023 21:37   DG Chest 1 View Result Date: 11/30/2023 CLINICAL DATA:  Dyspnea. EXAM: CHEST  1 VIEW COMPARISON:  11/27/2023 FINDINGS: Trace atelectasis noted left base. Lungs otherwise clear without focal pneumonia, pulmonary edema, or substantial pleural effusion. The cardiopericardial silhouette is within normal limits for size. No acute bony abnormality. Telemetry leads overlie the chest. IMPRESSION: No  active disease. Electronically Signed   By: Donnal Fusi M.D.   On: 11/30/2023 06:34    Procedures Procedures    Medications Ordered in ED Medications  acetaminophen  (TYLENOL ) tablet 1,000 mg (1,000 mg Oral Given 12/01/23 2125)  azithromycin  (ZITHROMAX ) 500 mg in sodium chloride  0.9 % 250 mL IVPB (500 mg Intravenous New Bag/Given 12/01/23 2221)  lactated ringers  infusion (has no administration in time range)  sodium chloride  0.9 % bolus 1,000 mL (0 mLs Intravenous Stopped 12/01/23 2205)  LORazepam  (ATIVAN ) injection 1 mg (1 mg Intravenous Given 12/01/23 2139)  cefTRIAXone  (ROCEPHIN ) 2 g in sodium chloride  0.9 % 100 mL IVPB (0 g Intravenous Stopped 12/01/23 2226)  lactated ringers  bolus 1,000  mL (1,000 mLs Intravenous New Bag/Given 12/01/23 2204)    ED Course/ Medical Decision Making/ A&P                                 Medical Decision Making Amount and/or Complexity of Data Reviewed Labs: ordered. Radiology: ordered.  Risk OTC drugs. Prescription drug management. Decision regarding hospitalization.   Patient with fever.  Tachycardia.  States she has had throwing up and diarrhea.  Also cough with sputum production.  Reviewed blood work from earlier today.  Reviewed x-ray from earlier today.  Both overall reassuring.  Will give fluid bolus.  Initial lactic acid elevated.  Will check urinalysis and repeat chest x-ray.  Reviewed note from earlier today.  Did have tachycardia at that time but not febrile.  Reviewed blood work.  Lactic acid elevated above 4.  Liter fluid bolus given along with Tylenol  to lower fever.  However do not see any endorgan damage at this time.  Not hypoxic.  Nausea vomiting diarrhea or potential source of the fever also.  Will get blood work x-ray and reevaluate.  2150 x-ray shows pneumonia.  What is a potential source of infection we will now treat with antibiotics and give fluid bolus.  Blood work otherwise reassuring.  Alcohol  level is elevated, causing  alcohol  withdrawal to be lower on the differential.  Will discuss with hospitalist for admission.  CRITICAL CARE Performed by: Mozell Arias Total critical care time: 30 minutes Critical care time was exclusive of separately billable procedures and treating other patients. Critical care was necessary to treat or prevent imminent or life-threatening deterioration. Critical care was time spent personally by me on the following activities: development of treatment plan with patient and/or surrogate as well as nursing, discussions with consultants, evaluation of patient's response to treatment, examination of patient, obtaining history from patient or surrogate, ordering and performing treatments and interventions, ordering and review of laboratory studies, ordering and review of radiographic studies, pulse oximetry and re-evaluation of patient's condition.  On reevaluation heart rate is coming down.  Respiratory rate coming down.  Has defervesced.         Final Clinical Impression(s) / ED Diagnoses Final diagnoses:  Community acquired pneumonia, unspecified laterality    Rx / DC Orders ED Discharge Orders     None         Mozell Arias, MD 12/01/23 2234

## 2023-12-01 NOTE — H&P (Incomplete)
 History and Physical    [name] JYN:[id number] DOB: [birth year] DOA: 12/01/2023  PCP: Patient, No Pcp Per  Patient coming from: Home  I have personally briefly reviewed patient's old medical records in [facility]  Chief Complaint: Shortness of breath  HPI: [name] is a 58 y.o. female with medical history significant for COPD, seizure disorder, iron  deficiency anemia, depression/anxiety/schizophrenia, polysubstance use (EtOH, cocaine, tobacco), medical nonadherence and frequent ED visits who presented to the ED for evaluation of shortness of breath with right lower chest discomfort.  Patient frequently seen in the ED with multiple visits over the last week, primarily for shortness of breath.  She was seen early this morning with shortness of breath and disorientation.  She was felt to have COPD exacerbation which improved with bronchodilators.  CXR was negative for pneumonia.  UDS was positive for cocaine, THC, benzodiazepines, and barbiturates (patient has received Ativan  and phenobarbital  during recent hospital visits).  It was suspected that some of her symptoms were related to cocaine use and she was given Ativan  with improvement in her sympathomimetic toxidrome.  She was subsequently discharged home.  Patient returned today with recurrent shortness of breath and a fever.  She is having right lower chest discomfort.  She reports frequent cough productive of clear sputum.  She is otherwise not really providing additional relevant history although she does admit to smoking 1 pack of cigarettes daily and drinks beer daily.  She does not recall the last time she used cocaine.  ED Course  Labs/Imaging on admission: I have personally reviewed following labs and imaging studies.  Initial vitals showed BP 122/77, pulse 135, RR 26, temp 1 to 2.2 F rectally, SpO2 100% on room air.  Labs showed WBC 15.5, hemoglobin 9.8, platelets 176, sodium 135, potassium 4.8, bicarb 20, BUN 6,  creatinine 0.58, serum glucose 165, lactic acid 4.4.  UA pending collection.  Serum ethanol 132.  Blood cultures in process.  Portable chest x-ray showed new confluent right lower lobe opacity suspicious for pneumonia.  Patient was given IV ceftriaxone  and azithromycin , 1 L LR and 1 L NS, IV Ativan  1 mg.  The hospitalist service was consulted to admit.  Review of Systems: All systems reviewed and are negative except as documented in history of present illness above.   Past Medical History:  Diagnosis Date  . Abdominal pain   . Accidental drug overdose April 2013  . Anxiety   . Atrial fibrillation (HCC) 09/29/11   converted spontaneously  . Chronic back pain   . Chronic knee pain   . Chronic nausea   . Chronic pain   . COPD (chronic obstructive pulmonary disease) (HCC)   . Depression   . Diabetes mellitus    states her doctor took her off all DM meds in past month  . Diabetic neuropathy (HCC)   . Dyspnea    with exertion   . GERD (gastroesophageal reflux disease)   . Headache(784.0)    migraines   . HTN (hypertension)    not on meds since in a year   . Hyperlipidemia   . Hypothyroidism    not on meds in a while   . Mental disorder    Bipolar and schizophrenic  . Nausea and vomiting 01/02/2023  . Requires supplemental oxygen     as needed per patient   . Schizophrenia (HCC)   . Schizophrenia, acute (HCC) 11/13/2017  . Tobacco abuse     Past Surgical History:  Procedure Laterality  Date  . ABDOMINAL HYSTERECTOMY    . BLADDER SUSPENSION  03/04/2011   Procedure: [facility] PROCEDURE;  Surgeon: Jackelyn Marvel MacDiarmid;  Location: [facility] ORS;  Service: Urology;  Laterality: N/A;  . BOWEL RESECTION N/A 04/18/2022   Procedure: SMALL BOWEL RESECTION;  Surgeon: Sim Dryer, MD;  Location: [facility] OR;  Service: General;  Laterality: N/A;  . CYSTOCELE REPAIR  03/04/2011   Procedure: ANTERIOR REPAIR (CYSTOCELE);  Surgeon: Devorah Fonder;  Location: [facility] ORS;  Service: Urology;  Laterality: N/A;  .  CYSTOSCOPY  03/04/2011   Procedure: CYSTOSCOPY;  Surgeon: Jackelyn Marvel MacDiarmid;  Location: [facility] OR[facility];  Service: Urology;  Laterality: N/A;  . ESOPHAGOGASTRODUODENOSCOPY (EGD) WITH PROPOFOL  N/A 05/12/2017   Procedure: ESOPHAGOGASTRODUODENOSCOPY (EGD) WITH PROPOFOL ;  Surgeon: Juanita Norlander, MD;  Location: [facility];  Service: General;  Laterality: N/A;  . GASTRIC ROUX-EN-Y N/A 03/25/2016   Procedure: LAPAROSCOPIC ROUX-EN-Y GASTRIC BYPASS WITH UPPER ENDOSCOPY;  Surgeon: Ayesha Lente, MD;  Location: [facility] ORS;  Service: General;  Laterality: N/A;  . KNEE SURGERY    . LAPAROSCOPIC ASSISTED VAGINAL HYSTERECTOMY  03/04/2011   Procedure: LAPAROSCOPIC ASSISTED VAGINAL HYSTERECTOMY;  Surgeon: Martine Sleek, MD;  Location: [facility];  Service: Gynecology;  Laterality: N/A;  . LAPAROTOMY N/A 04/18/2022   Procedure: EXPLORATORY LAPAROTOMY;  Surgeon: Sim Dryer, MD;  Location: [facility] OR;  Service: General;  Laterality: N/A;  . LAPAROTOMY N/A 04/24/2022   Procedure: BRING BACK EXPLORATORY LAPAROTOMY;  Surgeon: Dorena Gander, MD;  Location: [facility] OR;  Service: General;  Laterality: N/A;  . TOTAL HIP ARTHROPLASTY Right 08/27/2022   Procedure: TOTAL HIP ARTHROPLASTY;  Surgeon: Murleen Arms, MD;  Location: [facility] OR;  Service: Orthopedics;  Laterality: Right;    Social History: Social History   Tobacco Use  . Smoking status: Every Day    Current packs/day: 1.00    Types: Cigarettes  . Smokeless tobacco: Never  Vaping Use  . Vaping status: Never Used  Substance Use Topics  . Alcohol  use: Yes    Comment: 40 ounces daily  . Drug use: Yes    Types: Cocaine, Marijuana, "Crack" cocaine    Comment: last use- 03/19/2023   Allergies  Allergen Reactions  . Iron  Dextran Shortness Of Breath and Anxiety  . Aspirin  Nausea And Vomiting and Other (See Comments)    Ok to take tylenol  or ibuprofen    . Penicillins Other (See Comments)    Unknown reaction from childhood: family would like this to remain as an  allergy.    Family History  Problem Relation Age of Onset  . Heart attack Father        35s  . Diabetes Mother   . Heart disease Mother   . Hypertension Mother   . Heart attack Sister        68  . COPD Other   . Breast cancer Neg Hx      Prior to Admission medications   Medication Sig Start Date End Date Taking? Authorizing Provider  albuterol  (VENTOLIN  HFA) 108 (90 Base) MCG/ACT inhaler Inhale 1-2 puffs into the lungs every 6 (six) hours as needed for wheezing or shortness of breath. 10/29/23   Palumbo, April, MD  albuterol  (VENTOLIN  HFA) 108 (90 Base) MCG/ACT inhaler Inhale 2 puffs into the lungs every 4 (four) hours as needed for wheezing or shortness of breath. 11/30/23   Steinl, Kevin, MD  ARIPiprazole  (ABILIFY ) 10 MG tablet Take 1 tablet (10 mg total) by mouth daily. 11/01/23   Samtani, Jai-Gurmukh, MD  divalproex  (  DEPAKOTE  SPRINKLE) 125 MG capsule Take 2 capsules (250 mg total) by mouth every 12 (twelve) hours. 11/14/23   Ghimire, Estil Heman, MD  fluticasone  furoate-vilanterol (BREO ELLIPTA ) 100-25 MCG/ACT AEPB Inhale 1 puff into the lungs daily. 11/14/23   Ghimire, Estil Heman, MD  ipratropium-albuterol  (DUONEB) 0.5-2.5 (3) MG/3ML SOLN Take 3 mLs by nebulization every 6 (six) hours as needed (for shortness of breath or wheezing).    [provider]  levETIRAcetam  (KEPPRA  XR) 500 MG 24 hr tablet Take 1 tablet (500 mg total) by mouth 2 (two) times daily. 10/31/23   Samtani, Jai-Gurmukh, MD  metFORMIN  (GLUCOPHAGE -XR) 750 MG 24 hr tablet Take 1 tablet (750 mg total) by mouth daily with breakfast. 10/31/23   Samtani, Jai-Gurmukh, MD  OXYGEN  Inhale 4-5 L/min into the lungs as needed (for COPD-related symptoms).    [provider]  predniSONE  (DELTASONE ) 20 MG tablet Take 2 tablets (40 mg total) by mouth daily. 12/01/23   Guadalupe Lee, MD  thiamine  (VITAMIN B-1) 100 MG tablet Take 1 tablet (100 mg total) by mouth daily. 11/14/23   Ghimire, Estil Heman, MD  umeclidinium bromide   (INCRUSE ELLIPTA ) 62.5 MCG/ACT AEPB Inhale 1 puff into the lungs daily. 11/14/23   Ghimire, Estil Heman, MD  zonisamide  (ZONEGRAN ) 100 MG capsule Take 2 capsules (200 mg total) by mouth daily. 10/31/23   Verlie Glisson, MD    Physical Exam: Vitals:   12/01/23 2047 12/01/23 2134 12/01/23 2200 12/01/23 2300  BP: 122/77 131/77 (!) 96/50 (!) 102/56  Pulse: (!) 136 (!) 130 (!) 129 (!) 132  Resp: (!) 26 (!) 25 (!) 39 (!) 36  Temp: (!) 102.2 F (39 C) 99.9 F (37.7 C)    TempSrc: Rectal Oral    SpO2: 100% 93% 94% 90%  Weight:      Height:       Constitutional: Chronically ill-appearing woman resting in bed with head elevated, frequent cough, anxious Eyes: EOMI, lids and conjunctivae normal ENMT: Mucous membranes are moist. Posterior pharynx clear of any exudate or lesions.Normal dentition.  Neck: normal, supple, no masses. Respiratory: Expiratory wheezing bilaterally. Normal respiratory effort. No accessory muscle use.  Cardiovascular: Tachycardic, no murmurs / rubs / gallops. No extremity edema. 2+ pedal pulses. Abdomen: no tenderness, no masses palpated. Musculoskeletal: no clubbing / cyanosis. No joint deformity upper and lower extremities. Good ROM, no contractures. Normal muscle tone.  Skin: no rashes, lesions, ulcers. No induration Neurologic: Sensation intact. Strength equal bilaterally Psychiatric: Alert and oriented x 3.  Anxious mood.   EKG: Personally reviewed. Sinus tachycardia, rate 139, LAE, no acute ischemic changes.  Rate is faster when compared to previous.  Assessment/Plan Principal Problem:   Severe sepsis with lactic acidosis (HCC) Active Problems:   Aspiration pneumonia of right lower lobe (HCC)   Alcohol  use disorder, severe, dependence (HCC)   COPD with acute exacerbation (HCC)   Polysubstance abuse (HCC)   Iron  deficiency anemia   Seizure disorder (HCC)   [name] is a 57 y.o. female with medical history significant for COPD, seizure disorder, iron   deficiency anemia, depression/anxiety/schizophrenia, polysubstance use (EtOH, cocaine, tobacco), medical nonadherence and frequent ED visits who is admitted with severe sepsis due to right lower lobe pneumonia. *** Assessment and Plan: Severe sepsis due to right lower lobe pneumonia: Patient presenting with fever, tachycardia, tachypnea, leukocytosis, lactic acidosis, and new right lower lobe pneumonia seen on CXR.  Right lower lobe finding is new compared to CXR obtained in the morning.  Given severity of  illness and concern for aspiration will broaden antibiotics to Zosyn . - Start IV Zosyn  - Follow blood cultures, strep pneumonia Legionella urine antigens - Continue IV fluid hydration overnight - Supplemental oxygen  as needed  Alcohol  use disorder: EtOH level was <15 morning prior to admission and up to 132 at time of admission.  She has had history of severe withdrawal and at risk for same. - Placed on CIWA protocol with Ativan  as needed - Start Librium  taper - Thiamine , folate, MVM  COPD: ***  Seizure disorder: ***  Iron  deficiency anemia: Hemoglobin stable at 9.8.  Depression/anxiety/schizophrenia: ***    DVT prophylaxis: ***  Code Status: ***  Family Communication: ***  Disposition Plan: Pending clinical progress Consults called: None Severity of Illness: The appropriate patient status for this patient is INPATIENT. Inpatient status is judged to be reasonable and necessary in order to provide the required intensity of service to ensure the patient's safety. The patient's presenting symptoms, physical exam findings, and initial radiographic and laboratory data in the context of their chronic comorbidities is felt to place them at high risk for further clinical deterioration. Furthermore, it is not anticipated that the patient will be medically stable for discharge from the hospital within 2 midnights of admission.   * I certify that at the point of admission it is my  clinical judgment that the patient will require inpatient hospital care spanning beyond 2 midnights from the point of admission due to high intensity of service, high risk for further deterioration and high frequency of surveillance required.Edith Gores MD [facility]  If 7PM-7AM, please contact night-coverage [email or web address]  12/02/2023, 12:01 AM

## 2023-12-01 NOTE — ED Triage Notes (Signed)
 Pt BIB [facility] d/t SOB - took 6 puffs of inhaler and it did not help.   PB 140/73 HR 120 RA 94 RR 20 CBG 223

## 2023-12-01 NOTE — H&P (Signed)
 History and Physical    [name] XBJ:[id number] DOB: 13-Nov-1965 DOA: 12/01/2023  PCP: Patient, No Pcp Per  Patient coming from: Home  I have personally briefly reviewed patient's old medical records in [facility]  Chief Complaint: Shortness of breath  HPI: [name] is a 58 y.o. female with medical history significant for COPD, seizure disorder, iron  deficiency anemia, depression/anxiety/schizophrenia, polysubstance use (EtOH, cocaine, tobacco), medical nonadherence and frequent ED visits who presented to the ED for evaluation of shortness of breath with right lower chest discomfort.  Patient frequently seen in the ED with multiple visits over the last week, primarily for shortness of breath.  She was seen early this morning with shortness of breath and disorientation.  She was felt to have COPD exacerbation which improved with bronchodilators.  CXR was negative for pneumonia.  UDS was positive for cocaine, THC, benzodiazepines, and barbiturates (patient has received Ativan  and phenobarbital  during recent hospital visits).  It was suspected that some of her symptoms were related to cocaine use and she was given Ativan  with improvement in her sympathomimetic toxidrome.  She was subsequently discharged home.  Patient returned today with recurrent shortness of breath and a fever.  She is having right lower chest discomfort.***  ED Course  Labs/Imaging on admission: I have personally reviewed following labs and imaging studies.  Initial vitals showed BP 122/77, pulse 135, RR 26, temp 1 to 2.2 F rectally, SpO2 100% on room air.  Labs showed WBC 15.5, hemoglobin 9.8, platelets 176, sodium 135, potassium 4.8, bicarb 20, BUN 6, creatinine 0.58, serum glucose 165, lactic acid 4.4.  UA pending collection.  Serum ethanol 132.  Blood cultures in process.  Portable chest x-ray showed new confluent right lower lobe opacity suspicious for pneumonia.  Patient was given IV ceftriaxone   and azithromycin , 1 L LR and 1 L NS, IV Ativan  1 mg.  The hospitalist service was consulted to admit.  Review of Systems: All systems reviewed and are negative except as documented in history of present illness above.   Past Medical History:  Diagnosis Date   Abdominal pain    Accidental drug overdose April 2013   Anxiety    Atrial fibrillation (HCC) 09/29/11   converted spontaneously   Chronic back pain    Chronic knee pain    Chronic nausea    Chronic pain    COPD (chronic obstructive pulmonary disease) (HCC)    Depression    Diabetes mellitus    states her doctor took her off all DM meds in past month   Diabetic neuropathy (HCC)    Dyspnea    with exertion    GERD (gastroesophageal reflux disease)    Headache(784.0)    migraines    HTN (hypertension)    not on meds since in a year    Hyperlipidemia    Hypothyroidism    not on meds in a while    Mental disorder    Bipolar and schizophrenic   Nausea and vomiting 01/02/2023   Requires supplemental oxygen     as needed per patient    Schizophrenia (HCC)    Schizophrenia, acute (HCC) 11/13/2017   Tobacco abuse     Past Surgical History:  Procedure Laterality Date   ABDOMINAL HYSTERECTOMY     BLADDER SUSPENSION  03/04/2011   Procedure: [facility] PROCEDURE;  Surgeon: Jackelyn Marvel MacDiarmid;  Location: [facility] ORS;  Service: Urology;  Laterality: N/A;   BOWEL RESECTION N/A 04/18/2022   Procedure: SMALL BOWEL RESECTION;  Surgeon: Sim Dryer, MD;  Location: [facility] OR;  Service: General;  Laterality: N/A;   CYSTOCELE REPAIR  03/04/2011   Procedure: ANTERIOR REPAIR (CYSTOCELE);  Surgeon: Devorah Fonder;  Location: [facility];  Service: Urology;  Laterality: N/A;   CYSTOSCOPY  03/04/2011   Procedure: CYSTOSCOPY;  Surgeon: Jackelyn Marvel MacDiarmid;  Location: [facility];  Service: Urology;  Laterality: N/A;   ESOPHAGOGASTRODUODENOSCOPY (EGD) WITH PROPOFOL  N/A 05/12/2017   Procedure: ESOPHAGOGASTRODUODENOSCOPY (EGD) WITH PROPOFOL ;  Surgeon: Juanita Norlander,  MD;  Location: [facility];  Service: General;  Laterality: N/A;   GASTRIC ROUX-EN-Y N/A 03/25/2016   Procedure: LAPAROSCOPIC ROUX-EN-Y GASTRIC BYPASS WITH UPPER ENDOSCOPY;  Surgeon: Ayesha Lente, MD;  Location: [facility];  Service: General;  Laterality: N/A;   KNEE SURGERY     LAPAROSCOPIC ASSISTED VAGINAL HYSTERECTOMY  03/04/2011   Procedure: LAPAROSCOPIC ASSISTED VAGINAL HYSTERECTOMY;  Surgeon: Martine Sleek, MD;  Location: [facility];  Service: Gynecology;  Laterality: N/A;   LAPAROTOMY N/A 04/18/2022   Procedure: EXPLORATORY LAPAROTOMY;  Surgeon: Sim Dryer, MD;  Location: [facility] OR;  Service: General;  Laterality: N/A;   LAPAROTOMY N/A 04/24/2022   Procedure: BRING BACK EXPLORATORY LAPAROTOMY;  Surgeon: Dorena Gander, MD;  Location: [facility] OR;  Service: General;  Laterality: N/A;   TOTAL HIP ARTHROPLASTY Right 08/27/2022   Procedure: TOTAL HIP ARTHROPLASTY;  Surgeon: Murleen Arms, MD;  Location: [facility] OR;  Service: Orthopedics;  Laterality: Right;    Social History: Social History   Tobacco Use   Smoking status: Every Day    Current packs/day: 1.00    Types: Cigarettes   Smokeless tobacco: Never  Vaping Use   Vaping status: Never Used  Substance Use Topics   Alcohol  use: Yes    Comment: 40 ounces daily   Drug use: Yes    Types: Cocaine, Marijuana, "Crack" cocaine    Comment: last use- 03/19/2023   Allergies  Allergen Reactions   Iron  Dextran Shortness Of Breath and Anxiety   Aspirin  Nausea And Vomiting and Other (See Comments)    Ok to take tylenol  or ibuprofen     Penicillins Other (See Comments)    Unknown reaction from childhood: family would like this to remain as an allergy.    Family History  Problem Relation Age of Onset   Heart attack Father        58s   Diabetes Mother    Heart disease Mother    Hypertension Mother    Heart attack Sister        40   COPD Other    Breast cancer Neg Hx      Prior to Admission medications   Medication Sig Start Date  End Date Taking? Authorizing Provider  albuterol  (VENTOLIN  HFA) 108 (90 Base) MCG/ACT inhaler Inhale 1-2 puffs into the lungs every 6 (six) hours as needed for wheezing or shortness of breath. 10/29/23   Palumbo, April, MD  albuterol  (VENTOLIN  HFA) 108 (90 Base) MCG/ACT inhaler Inhale 2 puffs into the lungs every 4 (four) hours as needed for wheezing or shortness of breath. 11/30/23   Steinl, Kevin, MD  ARIPiprazole  (ABILIFY ) 10 MG tablet Take 1 tablet (10 mg total) by mouth daily. 11/01/23   Samtani, Jai-Gurmukh, MD  divalproex  (DEPAKOTE  SPRINKLE) 125 MG capsule Take 2 capsules (250 mg total) by mouth every 12 (twelve) hours. 11/14/23   Ghimire, Estil Heman, MD  fluticasone  furoate-vilanterol (BREO ELLIPTA ) 100-25 MCG/ACT AEPB Inhale 1 puff into the lungs daily. 11/14/23   Ghimire, Estil Heman, MD  ipratropium-albuterol  (DUONEB) 0.5-2.5 (3) MG/3ML SOLN Take 3 mLs by nebulization every 6 (six) hours as needed (for shortness of breath or wheezing).    [provider]  levETIRAcetam  (KEPPRA  XR) 500 MG 24 hr tablet Take 1 tablet (500 mg total) by mouth 2 (two) times daily. 10/31/23   Samtani, Jai-Gurmukh, MD  metFORMIN  (GLUCOPHAGE -XR) 750 MG 24 hr tablet Take 1 tablet (750 mg total) by mouth daily with breakfast. 10/31/23   Samtani, Jai-Gurmukh, MD  OXYGEN  Inhale 4-5 L/min into the lungs as needed (for COPD-related symptoms).    [provider]  predniSONE  (DELTASONE ) 20 MG tablet Take 2 tablets (40 mg total) by mouth daily. 12/01/23   Guadalupe Lee, MD  thiamine  (VITAMIN B-1) 100 MG tablet Take 1 tablet (100 mg total) by mouth daily. 11/14/23   Ghimire, Estil Heman, MD  umeclidinium bromide  (INCRUSE ELLIPTA ) 62.5 MCG/ACT AEPB Inhale 1 puff into the lungs daily. 11/14/23   Ghimire, Estil Heman, MD  zonisamide  (ZONEGRAN ) 100 MG capsule Take 2 capsules (200 mg total) by mouth daily. 10/31/23   Verlie Glisson, MD    Physical Exam: Vitals:   12/01/23 2047 12/01/23 2134 12/01/23 2200 12/01/23 2300  BP:  122/77 131/77 (!) 96/50 (!) 102/56  Pulse: (!) 136 (!) 130 (!) 129 (!) 132  Resp: (!) 26 (!) 25 (!) 39 (!) 36  Temp: (!) 102.2 F (39 C) 99.9 F (37.7 C)    TempSrc: Rectal Oral    SpO2: 100% 93% 94% 90%  Weight:      Height:       *** Constitutional: NAD, calm, comfortable Eyes: PERRL, lids and conjunctivae normal ENMT: Mucous membranes are moist. Posterior pharynx clear of any exudate or lesions.Normal dentition.  Neck: normal, supple, no masses. Respiratory: clear to auscultation bilaterally, no wheezing, no crackles. Normal respiratory effort. No accessory muscle use.  Cardiovascular: Regular rate and rhythm, no murmurs / rubs / gallops. No extremity edema. 2+ pedal pulses. Abdomen: no tenderness, no masses palpated. Musculoskeletal: no clubbing / cyanosis. No joint deformity upper and lower extremities. Good ROM, no contractures. Normal muscle tone.  Skin: no rashes, lesions, ulcers. No induration Neurologic: Sensation intact. Strength 5/5 in all 4.  Psychiatric: Normal judgment and insight. Alert and oriented x 3. Normal mood.   EKG: Personally reviewed. Sinus tachycardia, rate 139, LAE, no acute ischemic changes.  Rate is faster when compared to previous.  Assessment/Plan Active Problems:   Right lower lobe pneumonia   Alcohol  use disorder, severe, dependence (HCC)   COPD (chronic obstructive pulmonary disease) (HCC)   Polysubstance abuse (HCC)   Iron  deficiency anemia   Seizure disorder (HCC)   *** No notes on file *** Assessment and Plan: Severe sepsis due to right lower lobe pneumonia: Patient presenting with fever, tachycardia, tachypnea, leukocytosis, lactic acidosis, and new right lower lobe pneumonia seen on CXR.  Right lower lobe finding is new compared to CXR obtained in the morning.  Given severity of illness and concern for aspiration will broaden antibiotics.***  Alcohol  use disorder: EtOH level was <15 morning prior to admission and up to 132 at time of  admission.  She has had history of severe withdrawal and at risk for same. - Placed on CIWA protocol with Ativan  as needed - Start Librium  taper - Thiamine , folate, MVM  COPD: ***  Seizure disorder: ***  Iron  deficiency anemia: Hemoglobin stable at 9.8.  Depression/anxiety/schizophrenia: ***    DVT prophylaxis: ***  Code Status: ***  Family Communication: ***  Disposition Plan: Pending clinical progress Consults called: None Severity of Illness: The appropriate patient status for this patient is INPATIENT. Inpatient status is judged to be reasonable and necessary in order to provide the required intensity of service to ensure the patient's safety. The patient's presenting symptoms, physical exam findings, and initial radiographic and laboratory data in the context of their chronic comorbidities is felt to place them at high risk for further clinical deterioration. Furthermore, it is not anticipated that the patient will be medically stable for discharge from the hospital within 2 midnights of admission.   * I certify that at the point of admission it is my clinical judgment that the patient will require inpatient hospital care spanning beyond 2 midnights from the point of admission due to high intensity of service, high risk for further deterioration and high frequency of surveillance required.Edith Gores MD [facility]  If 7PM-7AM, please contact night-coverage [email or web address]  12/01/2023, 11:43 PM

## 2023-12-01 NOTE — ED Notes (Signed)
Daughter [name] [phone] would like an update asap

## 2023-12-01 NOTE — Progress Notes (Signed)
 Elink monitoring for the code sepsis protocol.

## 2023-12-01 NOTE — ED Triage Notes (Addendum)
 Pt BIB [facility] from home d/t Right rib cage pain - onset upon waking - on right side.  Pt here for for at least past 2 days for SOB as usual.    EMS gave 324 ASA PO  BP 122/70 HR 137 RR 26 CBG 220 O2 97%

## 2023-12-02 ENCOUNTER — Inpatient Hospital Stay (HOSPITAL_COMMUNITY)

## 2023-12-02 DIAGNOSIS — J90 Pleural effusion, not elsewhere classified: Secondary | ICD-10-CM

## 2023-12-02 DIAGNOSIS — J9601 Acute respiratory failure with hypoxia: Secondary | ICD-10-CM

## 2023-12-02 DIAGNOSIS — G9341 Metabolic encephalopathy: Secondary | ICD-10-CM

## 2023-12-02 DIAGNOSIS — A419 Sepsis, unspecified organism: Secondary | ICD-10-CM | POA: Diagnosis not present

## 2023-12-02 DIAGNOSIS — R6520 Severe sepsis without septic shock: Secondary | ICD-10-CM | POA: Diagnosis not present

## 2023-12-02 LAB — CBC
HCT: 30 % — ABNORMAL LOW (ref 36.0–46.0)
Hemoglobin: 8.5 g/dL — ABNORMAL LOW (ref 12.0–15.0)
MCH: 21.2 pg — ABNORMAL LOW (ref 26.0–34.0)
MCHC: 28.3 g/dL — ABNORMAL LOW (ref 30.0–36.0)
MCV: 74.8 fL — ABNORMAL LOW (ref 80.0–100.0)
Platelets: 114 10*3/uL — ABNORMAL LOW (ref 150–400)
RBC: 4.01 MIL/uL (ref 3.87–5.11)
RDW: 21.1 % — ABNORMAL HIGH (ref 11.5–15.5)
WBC: 2.5 10*3/uL — ABNORMAL LOW (ref 4.0–10.5)
nRBC: 0 % (ref 0.0–0.2)

## 2023-12-02 LAB — BASIC METABOLIC PANEL WITH GFR
Anion gap: 11 (ref 5–15)
Anion gap: 8 (ref 5–15)
BUN: 7 mg/dL (ref 6–20)
BUN: 9 mg/dL (ref 6–20)
CO2: 19 mmol/L — ABNORMAL LOW (ref 22–32)
CO2: 24 mmol/L (ref 22–32)
Calcium: 7.9 mg/dL — ABNORMAL LOW (ref 8.9–10.3)
Calcium: 7.8 mg/dL — ABNORMAL LOW (ref 8.9–10.3)
Chloride: 105 mmol/L (ref 98–111)
Chloride: 104 mmol/L (ref 98–111)
Creatinine, Ser: 0.63 mg/dL (ref 0.44–1.00)
Creatinine, Ser: 0.67 mg/dL (ref 0.44–1.00)
GFR, Estimated: >60 mL/min (ref >60)
GFR, Estimated: >60 mL/min (ref >60)
Glucose, Bld: 148 mg/dL — ABNORMAL HIGH (ref 70–99)
Glucose, Bld: 199 mg/dL — ABNORMAL HIGH (ref 70–99)
Potassium: 4.4 mmol/L (ref 3.5–5.1)
Potassium: 4.2 mmol/L (ref 3.5–5.1)
Sodium: 135 mmol/L (ref 135–145)
Sodium: 136 mmol/L (ref 135–145)

## 2023-12-02 LAB — CBC WITH DIFFERENTIAL/PLATELET
Abs Immature Granulocytes: 0 10*3/uL (ref 0.00–0.07)
Basophils Absolute: 0.1 10*3/uL (ref 0.0–0.1)
Basophils Relative: 1 %
Eosinophils Absolute: 0.1 10*3/uL (ref 0.0–0.5)
Eosinophils Relative: 1 %
HCT: 30.4 % — ABNORMAL LOW (ref 36.0–46.0)
Hemoglobin: 8.5 g/dL — ABNORMAL LOW (ref 12.0–15.0)
Lymphocytes Relative: 20 %
Lymphs Abs: 1.4 10*3/uL (ref 0.7–4.0)
MCH: 21.5 pg — ABNORMAL LOW (ref 26.0–34.0)
MCHC: 28 g/dL — ABNORMAL LOW (ref 30.0–36.0)
MCV: 77 fL — ABNORMAL LOW (ref 80.0–100.0)
Monocytes Absolute: 0.7 10*3/uL (ref 0.1–1.0)
Monocytes Relative: 10 %
Neutro Abs: 4.7 10*3/uL (ref 1.7–7.7)
Neutrophils Relative %: 68 %
Platelets: 151 10*3/uL (ref 150–400)
RBC: 3.95 MIL/uL (ref 3.87–5.11)
RDW: 21.3 % — ABNORMAL HIGH (ref 11.5–15.5)
WBC: 6.9 10*3/uL (ref 4.0–10.5)
nRBC: 0 % (ref 0.0–0.2)
nRBC: 1 /100{WBCs} — ABNORMAL HIGH

## 2023-12-02 LAB — I-STAT ARTERIAL BLOOD GAS, ED
Acid-Base Excess: 3 mmol/L — ABNORMAL HIGH (ref 0.0–2.0)
Acid-base deficit: 2 mmol/L (ref 0.0–2.0)
Bicarbonate: 21.5 mmol/L (ref 20.0–28.0)
Bicarbonate: 29.8 mmol/L — ABNORMAL HIGH (ref 20.0–28.0)
Calcium, Ion: 1.17 mmol/L (ref 1.15–1.40)
Calcium, Ion: 1.16 mmol/L (ref 1.15–1.40)
HCT: 27 % — ABNORMAL LOW (ref 36.0–46.0)
HCT: 27 % — ABNORMAL LOW (ref 36.0–46.0)
Hemoglobin: 9.2 g/dL — ABNORMAL LOW (ref 12.0–15.0)
Hemoglobin: 9.2 g/dL — ABNORMAL LOW (ref 12.0–15.0)
O2 Saturation: 93 %
O2 Saturation: 99 %
Patient temperature: 98.9
Potassium: 4.1 mmol/L (ref 3.5–5.1)
Potassium: 3.7 mmol/L (ref 3.5–5.1)
Sodium: 136 mmol/L (ref 135–145)
Sodium: 139 mmol/L (ref 135–145)
TCO2: 23 mmol/L (ref 22–32)
TCO2: 31 mmol/L (ref 22–32)
pCO2 arterial: 32.3 mmHg (ref 32–48)
pCO2 arterial: 57.6 mmHg — ABNORMAL HIGH (ref 32–48)
pH, Arterial: 7.433 (ref 7.35–7.45)
pH, Arterial: 7.321 — ABNORMAL LOW (ref 7.35–7.45)
pO2, Arterial: 65 mmHg — ABNORMAL LOW (ref 83–108)
pO2, Arterial: 184 mmHg — ABNORMAL HIGH (ref 83–108)

## 2023-12-02 LAB — I-STAT CG4 LACTIC ACID, ED: Lactic Acid, Venous: 3.2 mmol/L (ref 0.5–1.9)

## 2023-12-02 LAB — TROPONIN I (HIGH SENSITIVITY)
Troponin I (High Sensitivity): 3 ng/L (ref <18)
Troponin I (High Sensitivity): 4 ng/L (ref <18)
Troponin I (High Sensitivity): 6 ng/L (ref <18)

## 2023-12-02 LAB — GLUCOSE, CAPILLARY
Glucose-Capillary: 115 mg/dL — ABNORMAL HIGH (ref 70–99)
Glucose-Capillary: 144 mg/dL — ABNORMAL HIGH (ref 70–99)
Glucose-Capillary: 185 mg/dL — ABNORMAL HIGH (ref 70–99)
Glucose-Capillary: 242 mg/dL — ABNORMAL HIGH (ref 70–99)

## 2023-12-02 LAB — HEPATIC FUNCTION PANEL
ALT: 14 U/L (ref 0–44)
AST: 20 U/L (ref 15–41)
Albumin: 2.4 g/dL — ABNORMAL LOW (ref 3.5–5.0)
Alkaline Phosphatase: 52 U/L (ref 38–126)
Bilirubin, Direct: 0.3 mg/dL — ABNORMAL HIGH (ref 0.0–0.2)
Indirect Bilirubin: 0.7 mg/dL (ref 0.3–0.9)
Total Bilirubin: 1 mg/dL (ref 0.0–1.2)
Total Protein: 5.4 g/dL — ABNORMAL LOW (ref 6.5–8.1)

## 2023-12-02 LAB — URINALYSIS, W/ REFLEX TO CULTURE (INFECTION SUSPECTED)
Bacteria, UA: NONE SEEN
Bilirubin Urine: NEGATIVE
Glucose, UA: 50 mg/dL — AB
Hgb urine dipstick: NEGATIVE
Ketones, ur: NEGATIVE mg/dL
Leukocytes,Ua: NEGATIVE
Nitrite: NEGATIVE
Protein, ur: NEGATIVE mg/dL
Specific Gravity, Urine: 1.024 (ref 1.005–1.030)
pH: 6 (ref 5.0–8.0)

## 2023-12-02 LAB — LACTIC ACID, PLASMA
Lactic Acid, Venous: 3.9 mmol/L (ref 0.5–1.9)
Lactic Acid, Venous: 3.7 mmol/L (ref 0.5–1.9)
Lactic Acid, Venous: 2.6 mmol/L (ref 0.5–1.9)
Lactic Acid, Venous: 2.3 mmol/L (ref 0.5–1.9)

## 2023-12-02 LAB — BRAIN NATRIURETIC PEPTIDE: B Natriuretic Peptide: 79 pg/mL (ref 0.0–100.0)

## 2023-12-02 LAB — POCT I-STAT 7, (LYTES, BLD GAS, ICA,H+H)
Acid-Base Excess: 3 mmol/L — ABNORMAL HIGH (ref 0.0–2.0)
Bicarbonate: 29.5 mmol/L — ABNORMAL HIGH (ref 20.0–28.0)
Calcium, Ion: 1.15 mmol/L (ref 1.15–1.40)
HCT: 26 % — ABNORMAL LOW (ref 36.0–46.0)
Hemoglobin: 8.8 g/dL — ABNORMAL LOW (ref 12.0–15.0)
O2 Saturation: 99 %
Patient temperature: 37.5
Potassium: 3.7 mmol/L (ref 3.5–5.1)
Sodium: 142 mmol/L (ref 135–145)
TCO2: 31 mmol/L (ref 22–32)
pCO2 arterial: 55.3 mmHg — ABNORMAL HIGH (ref 32–48)
pH, Arterial: 7.338 — ABNORMAL LOW (ref 7.35–7.45)
pO2, Arterial: 137 mmHg — ABNORMAL HIGH (ref 83–108)

## 2023-12-02 LAB — MRSA NEXT GEN BY PCR, NASAL: MRSA by PCR Next Gen: DETECTED — AB

## 2023-12-02 LAB — PROCALCITONIN: Procalcitonin: 4.45 ng/mL

## 2023-12-02 MED ORDER — REVEFENACIN 175 MCG/3ML IN SOLN
175.0000 ug | Freq: Every day | RESPIRATORY_TRACT | Status: DC
  Administered 2023-12-03 – 2023-12-13 (×10): 175 ug via RESPIRATORY_TRACT
  Filled 2023-12-02 (×11): qty 3

## 2023-12-02 MED ORDER — FENTANYL BOLUS VIA INFUSION
25.0000 ug | INTRAVENOUS | Status: DC | PRN
  Administered 2023-12-02: 75 ug via INTRAVENOUS
  Administered 2023-12-03 – 2023-12-04 (×3): 100 ug via INTRAVENOUS
  Administered 2023-12-04 (×2): 50 ug via INTRAVENOUS
  Administered 2023-12-04: 100 ug via INTRAVENOUS

## 2023-12-02 MED ORDER — FENTANYL CITRATE (PF) 100 MCG/2ML IJ SOLN
INTRAMUSCULAR | Status: AC | PRN
  Administered 2023-12-02 (×2): 50 ug via INTRAVENOUS

## 2023-12-02 MED ORDER — ACETAMINOPHEN 325 MG PO TABS
650.0000 mg | ORAL_TABLET | Freq: Four times a day (QID) | ORAL | Status: DC | PRN
  Administered 2023-12-02: 650 mg
  Filled 2023-12-02: qty 2

## 2023-12-02 MED ORDER — THIAMINE HCL 100 MG/ML IJ SOLN
100.0000 mg | Freq: Every day | INTRAMUSCULAR | Status: DC
  Administered 2023-12-07: 100 mg via INTRAVENOUS
  Filled 2023-12-02 (×2): qty 2

## 2023-12-02 MED ORDER — ACETAMINOPHEN 650 MG RE SUPP: 650.0000 mg | Freq: Four times a day (QID) | RECTAL | Status: DC | PRN

## 2023-12-02 MED ORDER — ARIPIPRAZOLE 10 MG PO TABS
10.0000 mg | ORAL_TABLET | Freq: Every day | ORAL | Status: DC
  Administered 2023-12-02 – 2023-12-04 (×3): 10 mg
  Filled 2023-12-02 (×4): qty 1

## 2023-12-02 MED ORDER — VANCOMYCIN HCL IN DEXTROSE 1-5 GM/200ML-% IV SOLN
1000.0000 mg | Freq: Once | INTRAVENOUS | Status: AC
  Administered 2023-12-02: 1000 mg via INTRAVENOUS

## 2023-12-02 MED ORDER — SENNOSIDES-DOCUSATE SODIUM 8.6-50 MG PO TABS: 1.0000 | ORAL_TABLET | Freq: Every evening | ORAL | Status: DC | PRN

## 2023-12-02 MED ORDER — FENTANYL CITRATE PF 50 MCG/ML IJ SOSY: 25.0000 ug | PREFILLED_SYRINGE | Freq: Once | INTRAMUSCULAR | Status: DC

## 2023-12-02 MED ORDER — SODIUM BICARBONATE 8.4 % IV SOLN: 100.0000 meq | Freq: Once | INTRAVENOUS | Status: DC

## 2023-12-02 MED ORDER — CHLORHEXIDINE GLUCONATE CLOTH 2 % EX PADS
6.0000 | MEDICATED_PAD | Freq: Every day | CUTANEOUS | Status: DC
  Administered 2023-12-02 – 2023-12-12 (×11): 6 via TOPICAL

## 2023-12-02 MED ORDER — INSULIN ASPART 100 UNIT/ML IJ SOLN
0.0000 [IU] | INTRAMUSCULAR | Status: DC
  Administered 2023-12-02: 5 [IU] via SUBCUTANEOUS
  Administered 2023-12-02 – 2023-12-03 (×4): 3 [IU] via SUBCUTANEOUS
  Administered 2023-12-03: 2 [IU] via SUBCUTANEOUS
  Administered 2023-12-03 (×2): 3 [IU] via SUBCUTANEOUS
  Administered 2023-12-04: 2 [IU] via SUBCUTANEOUS
  Administered 2023-12-04: 3 [IU] via SUBCUTANEOUS
  Administered 2023-12-04: 5 [IU] via SUBCUTANEOUS
  Administered 2023-12-04: 3 [IU] via SUBCUTANEOUS
  Administered 2023-12-04 (×2): 5 [IU] via SUBCUTANEOUS
  Administered 2023-12-06 (×2): 3 [IU] via SUBCUTANEOUS
  Administered 2023-12-06: 2 [IU] via SUBCUTANEOUS
  Administered 2023-12-06 (×2): 5 [IU] via SUBCUTANEOUS
  Administered 2023-12-07: 8 [IU] via SUBCUTANEOUS
  Administered 2023-12-07: 3 [IU] via SUBCUTANEOUS
  Administered 2023-12-07: 2 [IU] via SUBCUTANEOUS
  Administered 2023-12-07 (×2): 3 [IU] via SUBCUTANEOUS
  Administered 2023-12-07 – 2023-12-08 (×2): 5 [IU] via SUBCUTANEOUS
  Administered 2023-12-08: 8 [IU] via SUBCUTANEOUS
  Administered 2023-12-08: 2 [IU] via SUBCUTANEOUS
  Administered 2023-12-08: 3 [IU] via SUBCUTANEOUS
  Administered 2023-12-08: 2 [IU] via SUBCUTANEOUS
  Administered 2023-12-09: 5 [IU] via SUBCUTANEOUS
  Administered 2023-12-09: 11 [IU] via SUBCUTANEOUS
  Administered 2023-12-09: 8 [IU] via SUBCUTANEOUS
  Administered 2023-12-09: 5 [IU] via SUBCUTANEOUS
  Administered 2023-12-09: 3 [IU] via SUBCUTANEOUS
  Administered 2023-12-09: 8 [IU] via SUBCUTANEOUS
  Administered 2023-12-10: 3 [IU] via SUBCUTANEOUS
  Administered 2023-12-10: 8 [IU] via SUBCUTANEOUS
  Administered 2023-12-10 (×2): 11 [IU] via SUBCUTANEOUS

## 2023-12-02 MED ORDER — IOHEXOL 350 MG/ML SOLN
75.0000 mL | Freq: Once | INTRAVENOUS | Status: AC | PRN
  Administered 2023-12-02: 75 mL via INTRAVENOUS

## 2023-12-02 MED ORDER — ARIPIPRAZOLE 10 MG PO TABS: 10.0000 mg | ORAL_TABLET | Freq: Every day | ORAL | Status: DC

## 2023-12-02 MED ORDER — PROPOFOL 1000 MG/100ML IV EMUL
0.0000 ug/kg/min | INTRAVENOUS | Status: DC
  Administered 2023-12-02: 10 ug/kg/min via INTRAVENOUS
  Administered 2023-12-02 – 2023-12-03 (×2): 15 ug/kg/min via INTRAVENOUS
  Filled 2023-12-02 (×3): qty 100

## 2023-12-02 MED ORDER — PIPERACILLIN-TAZOBACTAM 3.375 G IVPB
3.3750 g | Freq: Three times a day (TID) | INTRAVENOUS | Status: AC
  Administered 2023-12-02 – 2023-12-06 (×12): 3.375 g via INTRAVENOUS
  Filled 2023-12-02 (×15): qty 50

## 2023-12-02 MED ORDER — ZONISAMIDE 100 MG PO CAPS
200.0000 mg | ORAL_CAPSULE | Freq: Every day | ORAL | Status: DC
  Administered 2023-12-02 – 2023-12-12 (×8): 200 mg via ORAL
  Filled 2023-12-02 (×13): qty 2

## 2023-12-02 MED ORDER — VANCOMYCIN HCL 500 MG/100ML IV SOLN
500.0000 mg | Freq: Two times a day (BID) | INTRAVENOUS | Status: AC
  Administered 2023-12-02 – 2023-12-07 (×8): 500 mg via INTRAVENOUS
  Filled 2023-12-02 (×11): qty 100

## 2023-12-02 MED ORDER — ORAL CARE MOUTH RINSE
15.0000 mL | OROMUCOSAL | Status: DC | PRN
Start: 2023-12-02 — End: 2023-12-13

## 2023-12-02 MED ORDER — DOCUSATE SODIUM 50 MG/5ML PO LIQD
100.0000 mg | Freq: Two times a day (BID) | ORAL | Status: DC
Start: 2023-12-02 — End: 2023-12-05
  Administered 2023-12-02 – 2023-12-04 (×6): 100 mg
  Filled 2023-12-02 (×7): qty 10

## 2023-12-02 MED ORDER — MIDAZOLAM HCL 2 MG/2ML IJ SOLN
INTRAMUSCULAR | Status: AC
  Filled 2023-12-02: qty 2

## 2023-12-02 MED ORDER — MIDAZOLAM HCL 2 MG/2ML IJ SOLN
2.0000 mg | Freq: Once | INTRAMUSCULAR | Status: DC | PRN
  Filled 2023-12-02: qty 2

## 2023-12-02 MED ORDER — FENTANYL CITRATE PF 50 MCG/ML IJ SOSY
PREFILLED_SYRINGE | INTRAMUSCULAR | Status: AC
  Filled 2023-12-02: qty 2

## 2023-12-02 MED ORDER — SODIUM BICARBONATE 8.4 % IV SOLN
INTRAVENOUS | Status: AC | PRN
  Administered 2023-12-02 (×2): 100 meq via INTRAVENOUS

## 2023-12-02 MED ORDER — VASOPRESSIN 20 UNITS/100 ML INFUSION FOR SHOCK
0.0400 [IU]/min | INTRAVENOUS | Status: DC
  Filled 2023-12-02: qty 100

## 2023-12-02 MED ORDER — MIDAZOLAM HCL 5 MG/5ML IJ SOLN
INTRAMUSCULAR | Status: AC | PRN
  Administered 2023-12-02: 1 mg via INTRAVENOUS

## 2023-12-02 MED ORDER — HYDROCORTISONE SOD SUC (PF) 100 MG IJ SOLR
100.0000 mg | Freq: Three times a day (TID) | INTRAMUSCULAR | Status: DC
  Administered 2023-12-02 – 2023-12-03 (×4): 100 mg via INTRAVENOUS
  Filled 2023-12-02 (×4): qty 2

## 2023-12-02 MED ORDER — LACTATED RINGERS IV BOLUS
500.0000 mL | Freq: Once | INTRAVENOUS | Status: AC
  Administered 2023-12-02: 500 mL via INTRAVENOUS

## 2023-12-02 MED ORDER — NOREPINEPHRINE 4 MG/250ML-% IV SOLN
0.0000 ug/min | INTRAVENOUS | Status: DC
  Administered 2023-12-02: 5 ug/min via INTRAVENOUS
  Administered 2023-12-02: 10 ug/min via INTRAVENOUS
  Administered 2023-12-03: 2 ug/min via INTRAVENOUS
  Administered 2023-12-03: 7 ug/min via INTRAVENOUS
  Filled 2023-12-02 (×3): qty 250

## 2023-12-02 MED ORDER — THIAMINE MONONITRATE 100 MG PO TABS
100.0000 mg | ORAL_TABLET | Freq: Every day | ORAL | Status: DC
  Administered 2023-12-02 – 2023-12-08 (×6): 100 mg
  Filled 2023-12-02 (×6): qty 1

## 2023-12-02 MED ORDER — ROCURONIUM BROMIDE 10 MG/ML (PF) SYRINGE
PREFILLED_SYRINGE | INTRAVENOUS | Status: AC | PRN
  Administered 2023-12-02: 60 mg via INTRAVENOUS

## 2023-12-02 MED ORDER — LEVETIRACETAM ER 500 MG PO TB24
500.0000 mg | ORAL_TABLET | Freq: Two times a day (BID) | ORAL | Status: DC
  Filled 2023-12-02: qty 1

## 2023-12-02 MED ORDER — ETOMIDATE 2 MG/ML IV SOLN
INTRAVENOUS | Status: AC | PRN
  Administered 2023-12-02: 20 mg via INTRAVENOUS

## 2023-12-02 MED ORDER — VALPROIC ACID 250 MG/5ML PO SOLN
250.0000 mg | Freq: Two times a day (BID) | ORAL | Status: DC
  Administered 2023-12-02 – 2023-12-05 (×7): 250 mg
  Filled 2023-12-02 (×9): qty 5

## 2023-12-02 MED ORDER — DIVALPROEX SODIUM 125 MG PO CSDR: 250.0000 mg | DELAYED_RELEASE_CAPSULE | Freq: Two times a day (BID) | ORAL | Status: DC

## 2023-12-02 MED ORDER — FENTANYL 2500MCG IN NS 250ML (10MCG/ML) PREMIX INFUSION
0.0000 ug/h | INTRAVENOUS | Status: DC
  Administered 2023-12-02: 160 ug/h via INTRAVENOUS
  Administered 2023-12-02: 50 ug/h via INTRAVENOUS
  Filled 2023-12-02 (×2): qty 250

## 2023-12-02 MED ORDER — ADULT MULTIVITAMIN W/MINERALS CH
1.0000 | ORAL_TABLET | Freq: Every day | ORAL | Status: DC
  Administered 2023-12-02 – 2023-12-05 (×4): 1
  Filled 2023-12-02 (×4): qty 1

## 2023-12-02 MED ORDER — LEVETIRACETAM 500 MG PO TABS
500.0000 mg | ORAL_TABLET | Freq: Two times a day (BID) | ORAL | Status: DC
  Administered 2023-12-02 – 2023-12-05 (×7): 500 mg
  Filled 2023-12-02 (×7): qty 1

## 2023-12-02 MED ORDER — SODIUM BICARBONATE 8.4 % IV SOLN
INTRAVENOUS | Status: AC
  Filled 2023-12-02: qty 100

## 2023-12-02 MED ORDER — SODIUM CHLORIDE 0.9% FLUSH
10.0000 mL | Freq: Two times a day (BID) | INTRAVENOUS | Status: DC
  Administered 2023-12-02 – 2023-12-08 (×10): 10 mL
  Administered 2023-12-09: 20 mL
  Administered 2023-12-09 – 2023-12-10 (×4): 10 mL
  Administered 2023-12-11: 30 mL
  Administered 2023-12-11: 10 mL
  Administered 2023-12-12: 40 mL
  Administered 2023-12-12: 30 mL

## 2023-12-02 MED ORDER — SODIUM CHLORIDE 0.9 % IV SOLN
500.0000 mg | INTRAVENOUS | Status: AC
  Administered 2023-12-03 – 2023-12-04 (×2): 500 mg via INTRAVENOUS
  Filled 2023-12-02 (×2): qty 5

## 2023-12-02 MED ORDER — ORAL CARE MOUTH RINSE
15.0000 mL | OROMUCOSAL | Status: DC
  Administered 2023-12-02 – 2023-12-04 (×26): 15 mL via OROMUCOSAL

## 2023-12-02 MED ORDER — POLYETHYLENE GLYCOL 3350 17 G PO PACK
17.0000 g | PACK | Freq: Every day | ORAL | Status: DC
Start: 2023-12-02 — End: 2023-12-05
  Administered 2023-12-02 – 2023-12-04 (×2): 17 g
  Filled 2023-12-02 (×4): qty 1

## 2023-12-02 MED ORDER — SODIUM CHLORIDE 0.9% FLUSH: 10.0000 mL | INTRAVENOUS | Status: DC | PRN

## 2023-12-02 MED ORDER — IPRATROPIUM-ALBUTEROL 0.5-2.5 (3) MG/3ML IN SOLN
3.0000 mL | RESPIRATORY_TRACT | Status: DC
  Administered 2023-12-02: 3 mL via RESPIRATORY_TRACT
  Filled 2023-12-02: qty 3

## 2023-12-02 NOTE — Procedures (Signed)
 Bronchoscopy Procedure Note  SHARUNDA SALMON  [id number]  Oct 29, 1965  Date:12/02/23  Time:11:30 AM   Provider Performing:Taggart Prasad C Felipe Horton   Procedure(s):  Flexible bronchoscopy with bronchial alveolar lavage [phone])  Indication(s) Aspiration, copious amounts of secretions   Consent Unable to obtain consent due to emergent nature of procedure.  Anesthesia Continuous sedation but received 1 x dose of fentanyl  100 mcg bolus    Time Out Verified patient identification, verified procedure, site/side was marked, verified correct patient position, special equipment/implants available, medications/allergies/relevant history reviewed, required imaging and test results available.   Sterile Technique Usual hand hygiene, masks, gowns, and gloves were used   Procedure Description Bronchoscope advanced through endotracheal tube and into airway.  Airways were examined down to subsegmental level with findings noted below.   Following diagnostic evaluation, BAL(s) performed in 50 ml with normal saline and return of 50 ml fluid  Findings:  Thin Bilious secretion Right > Left  RLL BAL obtained    Complications/Tolerance None; patient tolerated the procedure well. Chest X-ray is not needed post procedure.   EBL Minimal   Specimen(s) RLL BAL obtained   Rey Catholic AGACNP-BC   [facility] 12/02/2023, 11:33 AM  Please see [email or web address] for pager details.  From 7A-7P if no response, please call [phone]. After hours, please call ELink [phone].

## 2023-12-02 NOTE — Procedures (Addendum)
 Central Venous Catheter Insertion Procedure Note  [name]  [id number]  06/03/1966  Date:12/02/23  Time:8:51 AM   Provider Performing:Dorla Guizar   Procedure: Insertion of Non-tunneled Central Venous Catheter(36556) with US  guidance (21308)   Indication(s) Medication administration, Difficult access, and Hemodialysis  Consent Risks of the procedure as well as the alternatives and risks of each were explained to the patient and/or caregiver.  Consent for the procedure was obtained and is signed in the bedside chart  Anesthesia Topical only with 1% lidocaine    Timeout Verified patient identification, verified procedure, site/side was marked, verified correct patient position, special equipment/implants available, medications/allergies/relevant history reviewed, required imaging and test results available.  Sterile Technique Maximal sterile technique including full sterile barrier drape, hand hygiene, sterile gown, sterile gloves, mask, hair covering, sterile ultrasound probe cover (if used).  Procedure Description Area of catheter insertion was cleaned with chlorhexidine  and draped in sterile fashion.  With real-time ultrasound guidance a central venous catheter was placed into the left internal jugular vein. Nonpulsatile blood flow and easy flushing noted in all ports.  The catheter was sutured in place and sterile dressing applied.  Complications/Tolerance None; patient tolerated the procedure well. Chest X-ray is ordered to verify placement for internal jugular or subclavian cannulation.   Chest x-ray is not ordered for femoral cannulation.   EBL Minimal  Specimen(s) None [name] [name] ACNP Acute Care Nurse Practitioner [name] Please consult Amion 12/02/2023, 8:52 AM

## 2023-12-02 NOTE — Discharge Instructions (Addendum)
 CSW added substance abuse resources to patient's AVS.  Niels Portugal, MSW, LCSW Transitions of Care  Clinical Social Worker II [phone]      In a time of Crisis: [facility]  Mobile Crisis Management provides immediate crisis response, 24/7.  Call [phone]  [facility] for MH/DD/SA [facility] is available 24 hours a day, 7 days a week. Customer Service Specialists will assist you to find a crisis provider that is well-matched with your needs. Your local number is: [phone]  [facility] ([facility]) IOP, individual counseling, medication management [address] [place], [place] [address] [phone] Call for intake hours; Medicaid and Uninsured    Substance Use Outpatient Providers  [facility] ([facility]) Group and individual counseling. [address]  [place], [place] [address] [phone] [place]: [phone]  [place]: [phone] Medicaid and uninsured.   [facility] Offers IOP groups multiple times per week. [address] [place], [place] [address] [phone] Takes Medicaid and other insurances.   [facility]  Chemical Dependency Intensive Outpatient Program (IOP) [address] [place], [place] [address] [phone] Takes Nurse, learning disability and [place].   [facility]  IOP and Partial Hospitalization Program  [address]  [place], [place] [address] [phone] Private Insurance, [place] only for partial hospitalization  [facility] [address], [address], [place], [place] [address] [phone] Monday-Friday. Short and Long term options.  [facility] ([facility]) IOP, individual counseling, medication management [address] [place], [place] [address] [phone] Medicaid and [facility]  [facility] [address]  [place], [place] [address] [phone] Private Insurance and Self Pay   [facility] Outpatient [address]  [place], [place] [address] [phone] Private Insurance, [place], and Self Pay   [facility]  [address] [place], [place] [address] [facility]  [address]  [place], [place] [address] [phone]  [facility]  [address] [place], [place] [address] [phone]      Residential Treatment Programs  [facility] [address] [place], [place] [address] [phone] or [phone] Detox and Residential Rehab 21 days (Medicaid, private insurance, and self pay. If Medicare, will look into funding). No methadone. Call for pre-screen.   [facility]) [address]  [place], [place] [address] [phone] Detox 3-7 days (self Pay and Medicaid Limited availability). Transitional Program for females needs 60 days clean first.  Rehab Only for Males (Medicare, Medicaid, and Self Pay)-No methadone.  [facility] [address] [place], [place] [address] [phone] or [phone] Private Insurance only  [facility] PHONE: [phone] FAX: [phone] Residential program for women 21 and over for up to a year through a Christian 12-step recovery model. Self-pay.    [facility] [address] [place], [place] [address] Phone:  [phone] Must be detoxed 72 hours prior to admission; 28 day program.  Self-pay.  [facility] [address]  [place], [place] [phone] [employer], Medicare, [place] (not straight [place]). They offer assistance with transportation.   [facility] [address] [place],  [place], [place] [address] [phone] Christian Based Program. Men only. No insurance  [facility] is a substance use  disorder treatment program for women,  including those who are pregnant, parenting, and/or whose lives have been touched by abuse and violence. ([phone]) [phone]  [facility] [address] [place], [place] [address] Women's: [phone] Men's: [phone] No Medicaid.   [facility] Locations across the [place] (mainly [place] ) willing to help with transportation.  [phone] [employer]. [facility]  [address] [place].  [place], [place] [address] [phone] Treatment Only, must make assessment appointment, and must be sober for assessment appointment. Self pay, [facility], must be [facility] resident. No methadone.   [facility]  [address] [place], [place] [address] [phone] No pending legal charges, Long-term work program. No methadone. Call for assessment.  [facility]  [address] [place], [place] [address] [phone] or [phone] Commercial Insurance Only  [facility] - [phone] Private Insurance (no [place]). Males/Females, call to make referrals, multiple facilities.   [facility]: [facility] [address] [place], [place] [address] [phone]  [facility]-no methadone: [facility] [address] [place], [place] [address] [phone] [facility] [phone] [place], [place] For women, houses 8 residents for sober living. No Medicaid.         AA Meetings Website to locate meetings (virtually or in person): https://www.young.biz/ Phone: [phone]  Syringe Services Program: Due to COVID-19, syringe services programs are likely operating under different hours with limited or no fixed site hours. Some programs may not be operating at all. Please contact the program directly using the  phone numbers provided below to see if they are still operating under COVID-19. [facility] Solution to the Opioid Problem (GCSTOP) Fixed; mobile; peer-based;Norman Cummins) [phone] [email or web address] Fixed site exchange at [facility], [address] [place], [place] [address] on Wednesdays (2:00 - 5:00 pm) and Thursdays (4:00 - 8:00 pm). Pop-up mobile exchange locations: [employer], [address] [place], [place] [address] on Tuesdays (11:00 am - 1:00 pm) and Fridays (11:00 am - 1:00 pm) -[facility] - [address], [place], [place] [address] on Tuesdays (2:00 - 4:00 pm) and Fridays (2:00 - 4:00 pm) -[facility] copy - also serves Radio broadcast assistant and [employer] [place] [employer];Fixed; mobile; peer-based; Velia Specking [phone] [email or web address] [address], [place], [place] [address] Delivery and outreach available in [place] and [place], please call for more information. Monday, Tuesday: 1:00 -7:00 pm, Thursday: 4:00 pm - 8:00 pm, Friday: 1:00 pm - 8:00 pm)  Medication-Assisted Treatment (MAT):  -[facility]- services [address] and surrounding areas including [place], [place], [place], [place], [address], [place], [place], [place], [place], and [place], [place]. Options include Methadone, buprenorphine or Suboxone. [address], [place], [place] [address] Phone: [phone] Mon - Fri: 5:30am - 2:00pm Sat: 5:30am -7:30am Sun: Closed Holidays: 6:00am - 8:00am  -[facility]- We use FDA-approved medications, like methadone/suboxone/sublocade, and vivitrol. These medications are then combined with customized care plans that include individual or group counseling, toxicology, and medical care directed by on-site physicians. Accepts most insurance plans, Medicaid, and private pay.  [address] [place], [place] [address] Phone:  [phone] Monday-Friday 5:00 AM - 10:00 AM Saturday 6:00 AM - 8:30 AM Sunday 6:00 AM - 7:00 AM  -[facility]- [facility] is a treatment & recovery focused program. In addition to receiving methadone medication, our clients participate in individual  and group counseling as well as random drug testing. If accepted into the [facility] Opioid Program, you will be provided several intake appointments and a physical exam [address] [place], [place] [address] Office: [phone]  Fax: [phone]  -[facility]- We put our community members at the center of everything we do, for remote treatment services as well as in-person, from alcohol  withdrawal to opioid use and more.  [address], [place], [place] [address] [phone] Monday-Wednesday: 9:00am - 5:00pm Thursday: 9:00am - 6:00pm Friday: 9:00am - 5:00pm Saturday: 9:00am - 1:00pm Sunday: Closed  -[facility] [place]) [address], [place], [place] [address] [phone]  [place] [phone] [address] [place], [place] [address]  M-W    5:00am-12:00pm Thu     5:00am-10:00am Fri       5:00am-12:00pm Sat      5:00am-8:00am Sun     Closed  $12/daily for Methadone Treatment.    Please follow-up with your family physician within a week, get your right nipple checked, you must follow with your OB physician in 1 to 2 weeks as well.  Get a referral from your primary care physician.    Do not drive, operate heavy machinery, perform activities at heights, swimming or participation in water  activities or provide baby sitting services until you have seen by Primary MD or a Neurologist and advised to do so again.  Follow with Primary MD Patient, No Pcp Per in 7 days   Get CBC, CMP, magnesium , 2 view Chest X ray -  checked next visit with your primary MD   Activity: As tolerated with Full fall precautions use walker/cane & assistance as needed  Disposition Home     Diet: Heart Healthy low carbohydrate diet.  Special Instructions: If you have smoked or chewed Tobacco  in the last 2 yrs please stop smoking, stop any regular Alcohol   and or any Recreational drug use.  On your next visit with your primary care physician please Get Medicines reviewed and adjusted.  Please request your Prim.MD to go over all Hospital Tests and Procedure/Radiological results at the follow up, please get all Hospital records sent to your Prim MD by signing hospital release before you go home.  If you experience worsening of your admission symptoms, develop shortness of breath, life threatening emergency, suicidal or homicidal thoughts you must seek medical attention immediately by calling 911 or calling your MD immediately  if symptoms less severe.  You Must read complete instructions/literature along with all the possible adverse reactions/side effects for all the Medicines you take and that have been prescribed to you. Take any new Medicines after you have completely understood and accpet all the possible adverse reactions/side effects.   Do not drive when taking Pain medications.  Do not take more than prescribed Pain, Sleep and Anxiety Medications  Wear Seat belts while driving.   Please note  You were cared for by a hospitalist during your hospital stay. If you have any questions about your discharge medications or the care you received while you were in the hospital after you are discharged, you can call the unit and asked to speak with the hospitalist on call if the hospitalist that took care of you is not available. Once you are discharged, your primary care physician will handle any further medical issues. Please note that NO REFILLS for any discharge medications will be authorized once you are discharged, as it is imperative that you return to your primary  care physician (or establish a relationship with a primary care physician if you do not have one) for your  aftercare needs so that they can reassess your need for medications and monitor your lab values.

## 2023-12-02 NOTE — Hospital Course (Signed)
 [name] is a 58 y.o. female with medical history significant for COPD, seizure disorder, iron  deficiency anemia, depression/anxiety/schizophrenia, polysubstance use (EtOH, cocaine, tobacco), medical nonadherence and frequent ED visits who is admitted with severe sepsis due to right lower lobe pneumonia.

## 2023-12-02 NOTE — Procedures (Signed)
 Intubation Procedure Note  MADELLINE ESHBACH  [id number]  12-Jul-1965  Date:12/02/23  Time:8:48 AM   Provider Performing:Gerline Ratto    Procedure: Intubation (31500)  Indication(s) Respiratory Failure  Consent Risks of the procedure as well as the alternatives and risks of each were explained to the patient and/or caregiver.  Consent for the procedure was obtained and is signed in the bedside chart   Anesthesia Etomidate , Versed , Fentanyl , and Rocuronium    Time Out Verified patient identification, verified procedure, site/side was marked, verified correct patient position, special equipment/implants available, medications/allergies/relevant history reviewed, required imaging and test results available.   Sterile Technique Usual hand hygeine, masks, and gloves were used   Procedure Description Patient positioned in bed supine.  Sedation given as noted above.  Patient was intubated with endotracheal tube using Glidescope.  View was Grade 1 full glottis .  Number of attempts was 1.  Colorimetric CO2 detector was consistent with tracheal placement.   Complications/Tolerance None; patient tolerated the procedure well. Chest X-ray is ordered to verify placement.   EBL Minimal   Specimen(s) None    [name] Deundra Bard ACNP Acute Care Nurse Practitioner [name] Pulmonary/Critical Care Please consult Amion 12/02/2023, 8:49 AM

## 2023-12-02 NOTE — Progress Notes (Signed)
 Pharmacy Antibiotic Note  [name] is a 58 y.o. female admitted on 12/01/2023 with pneumonia.  Pharmacy has been consulted for vancomycin  dosing.  WBC 15.5 > 6.9, lactate 4.4 > 3.2, tmax 102.2, HR 127, Scr 0.67.   Plan: Vancomycin  1000 mg IV x 1  Then, start vancomycin  500 mg IV every 12 hours (eAUC  442, Scr rounded to 0.8, Vd 072, IBW)  F/u renal function, cultures, and overall clinical picture  Levels as indicated  Height: 5' 2 (157.5 cm) Weight: 58 kg (127 lb 13.9 oz) IBW/kg (Calculated) : 50.1  Temp (24hrs), Avg:100.1 F (37.8 C), Min:98.9 F (37.2 C), Max:102.2 F (39 C)  Recent Labs  Lab 11/30/23 0605 12/01/23 0535 12/01/23 2104 12/01/23 2106 12/01/23 2115 12/02/23 0105 12/02/23 0110 12/02/23 0646  WBC 4.8 5.8 15.5*  --   --  2.5*  --  6.9  CREATININE 0.40* 0.59  --   --  0.58 0.63  --  0.67  LATICACIDVEN  --   --   --  4.4*  --   --  3.2*  --     Estimated Creatinine Clearance: 59.9 mL/min (by C-G formula based on SCr of 0.67 mg/dL).    Allergies  Allergen Reactions   Iron  Dextran Shortness Of Breath and Anxiety   Aspirin  Nausea And Vomiting and Other (See Comments)    Ok to take tylenol  or ibuprofen     Penicillins Other (See Comments)    Unknown reaction from childhood: family would like this to remain as an allergy.    Antimicrobials this admission: Vancomycin  6/10 >>  Zosyn  6/11 >>  Azithromycin  6/10 >> 6/13   Microbiology results: 6/10 BCx: ngtd  Thank you for allowing pharmacy to be a part of this patient's care.  Clayborn Cunas 12/02/2023 8:34 AM

## 2023-12-02 NOTE — Progress Notes (Signed)
 Pt transported on vent to CT4

## 2023-12-02 NOTE — Progress Notes (Signed)
 CSW added substance abuse resources to patient's AVS.  Edwin Dada, MSW, LCSW Transitions of Care  Clinical Social Worker II [phone]

## 2023-12-02 NOTE — ED Notes (Addendum)
 [facility]  Hospitalist (MD [facility]) on call at bedside

## 2023-12-02 NOTE — Progress Notes (Signed)
 Pt transported from CT to 310 on vent w/o any complications

## 2023-12-02 NOTE — TOC CM/SW Note (Addendum)
 Transition of Care [facility]) - Inpatient Brief Assessment   Patient Details  Name: [name] MRN: [id number] Date of Birth: 24-Feb-1966  [facility] of [facility] CM/SW Contact:    Tom-Johnson, Angelique Ken, RN Phone Number: 12/02/2023, 1:03 PM   Clinical Narrative:  Patient presented to the ED with Shortness Of Breath and Rt Lower Chest discomfort. Patient has been seen in the ED with multiple visits for same complaint. Patient has hx of COPD, Seizure disorder, Iron  Deficiency Anemia, Depression, Anxiety, Schizophrenia, Polysubstance use (EtOH, Cocaine, Tobacco).  Patient is currently intubated and sedated. Admitted with Severe Sepsis with Lactic Acidosis, on IV abx and Neb tx.  Patient not Medically ready for discharge.  CM will continue to follow as patient progresses with care towards discharge.       Transition of Care Asessment:

## 2023-12-02 NOTE — Progress Notes (Addendum)
 12/02/2023  Updates through day: haziness at R base is all consolidation so bronch performed: bilious material c/w aspiration event.  Let rest tonight, can consider SBT in AM depending on O2 needs and secretion burden.  Daughter updated on plan of care by phone.  Ardelle Kos MD PCCM

## 2023-12-02 NOTE — TOC CM/SW Note (Signed)
 Transition of Care [facility]) - Inpatient Brief Assessment   Patient Details  Name: [name] MRN: [id number] Date of Birth: [birth year]  [facility] CM/SW Contact:    Tom-Johnson, Angelique Ken, RN Phone Number: 12/02/2023, 12:49 PM   Clinical Narrative:  Patient presented to the ED with worsening Shortness Of Breath with Rt Lower Chest discomfort. Patient has been seen in the ED with multiple visits for same. Patient has hx of COPD, Seizure Disorder, Iron  Deficiency Anemia, Depression, Anxiety, Schizophrenia, Polysubstance use ETOH, Cocaine, Tobacco.    Patient currently intubated and sedated. On IV abx, Neb tx.   Patient not Medically ready for discharge.  CM will continue to follow as patient progresses with care towards discharge.         Transition of Care Asessment: Insurance and Status: Insurance coverage has been reviewed Patient has primary care physician: No Home environment has been reviewed: Yes Prior level of function:: Independent Prior/Current Home Services: No current home services Social Drivers of Health Review: SDOH reviewed no interventions necessary Readmission risk has been reviewed: Yes Transition of care needs: transition of care needs identified, TOC will continue to follow

## 2023-12-02 NOTE — ED Notes (Signed)
RT at bedside, pt placed on Bipap

## 2023-12-02 NOTE — ED Notes (Signed)
 Pt's daughter updated by Dr Arbutus Knoll thru telephone call.

## 2023-12-02 NOTE — Consult Note (Signed)
 NAME:  [name], MRN:  [id number], DOB:  September 27, 1965, LOS: 1 ADMISSION DATE:  12/01/2023, CONSULTATION DATE:  12/02/23 REFERRING MD:  TRH, CHIEF COMPLAINT:  SOB   History of Present Illness:  58 year old woman w/ hx of cocaine/alcohol  abuse, anxiety/schizophrenia, COPD, seizure disorder presenting with worsening SOB, RUQ and R lower chest pain over past several days.  Seen in ER for this issue a couple times in past week with normal CXR.  This ER visit, new hypoxemia, lactic acidosis, and RLL infiltrate w/ effusion  Started on CAP coverage but worsening BP and oxygenation so PCCM consulted.  ROS as below  Pertinent  Medical History   Past Medical History:  Diagnosis Date   Abdominal pain    Accidental drug overdose April 2013   Anxiety    Atrial fibrillation (HCC) 09/29/11   converted spontaneously   Chronic back pain    Chronic knee pain    Chronic nausea    Chronic pain    COPD (chronic obstructive pulmonary disease) (HCC)    Depression    Diabetes mellitus    states her doctor took her off all DM meds in past month   Diabetic neuropathy (HCC)    Dyspnea    with exertion    GERD (gastroesophageal reflux disease)    Headache(784.0)    migraines    HTN (hypertension)    not on meds since in a year    Hyperlipidemia    Hypothyroidism    not on meds in a while    Mental disorder    Bipolar and schizophrenic   Nausea and vomiting 01/02/2023   Requires supplemental oxygen     as needed per patient    Schizophrenia (HCC)    Schizophrenia, acute (HCC) 11/13/2017   Tobacco abuse      Significant Hospital Events: Including procedures, antibiotic start and stop dates in addition to other pertinent events   Multiple ER visits for various complaints noted, 20+ this year  Interim History / Subjective:  Admit  Objective    Blood pressure (!) 87/69, pulse (!) 127, temperature 98.9 F (37.2 C), temperature source Oral, resp. rate (!) 22, height 5' 2 (1.575 m),  weight 58 kg, last menstrual period 01/08/2011, SpO2 93%.    Vent Mode: BIPAP FiO2 (%):  [40 %] 40 % Set Rate:  [15 bmp] 15 bmp PEEP:  [5 cmH20] 5 cmH20   Intake/Output Summary (Last 24 hours) at 12/02/2023 0801 Last data filed at 12/02/2023 4098 Gross per 24 hour  Intake 2443.6 ml  Output 650 ml  Net 1793.6 ml   Filed Weights   12/01/23 2044  Weight: 58 kg    Examination: General: ill appearing HENT: MM dry Lungs: harsh rhonci, +accessory muscle use, thick grey secretions, diminished right lower sounds with moderate effusion complex appearing Cardiovascular: tachy, mild RV dilation and small pericardial effusion w/o tamponade Abdomen: soft, TTP RUQ Extremities: warm, +arthritic changes Neuro: moves to command Skin: no rashes  Pancytopenia on admit noted  Assessment and Plan  Impending septic shock from RLL aspiration vs. 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 At risk Dts Hx of seizures  - Intubate, CVL, R pigtail, send for usual studies - CT Chest - Vanc, zosyn  (has allergy but I guess has tolerated in past), azithromycin  - Check pct, tracheal aspirate - AEDs to per tube - Prop/fent for sedation, former can ward off any Dts - Longer term needs some sort of support to prevent her recurrent ER utilization; will discuss with admin - Daughter updated by phone - Thiamine  - Stress steroids, triple neb therapy - Ok for [employer] (right click and  Reselect all SmartList Selections daily)   Diet/type: NPO DVT prophylaxis LMWH Pressure ulcer(s): N/A GI prophylaxis: PPI Lines: Central line Foley:  Yes, and it is still needed Code Status:  full code Last date of multidisciplinary goals of care discussion [updated daughter by phone, patient at bedside although she is pretty agitated/confused/tachypneic]  Labs   CBC: Recent Labs  Lab 11/26/23 0444 11/27/23 0812 11/30/23 0605 11/30/23 0611 12/01/23 0535 12/01/23 2104 12/02/23 0105 12/02/23 0535 12/02/23 0646  WBC 4.4   < > 4.8  --  5.8 15.5* 2.5*  --  6.9  NEUTROABS 2.4  --  3.4  --  4.1 12.9*  --   --  PENDING  HGB 9.3*   < > 9.1*   < > 8.4* 9.8* 8.5* 9.2* 8.5*  HCT 32.0*   < > 31.8*   < > 29.0* 34.3* 30.0* 27.0* 30.4*  MCV 74.9*   < > 75.7*  --  73.4* 76.1* 74.8*  --  77.0*  PLT 409*   < > 250  --  216 176 114*  --  151   < > = values in this interval not displayed.    Basic Metabolic Panel: Recent Labs  Lab 11/30/23 0605 11/30/23 0611 12/01/23 0535 12/01/23 2115 12/02/23 0105 12/02/23 0535 12/02/23 0646  NA 138   < > 138 135 135 136 136  K 4.0   < > 4.2 4.8 4.4 4.1 4.2  CL 103  --  106 101 105  --  104  CO2 20*  --  22 20* 19*  --  24  GLUCOSE 227*  --  213* 165* 148*  --  199*  BUN <5*  --  6 6 7   --  9  CREATININE 0.40*  --  0.59 0.58 0.63  --  0.67  CALCIUM  8.7*  --  8.1* 9.0 7.9*  --  7.8*  MG 2.0  --   --   --   --   --   --    < > = values in this interval not displayed.   GFR: Estimated Creatinine Clearance: 59.9 mL/min (by C-G formula based on SCr of 0.67 mg/dL). Recent Labs  Lab 12/01/23 0535 12/01/23 2104 12/01/23 2106 12/02/23 0105 12/02/23 0110 12/02/23 0646  WBC 5.8 15.5*  --  2.5*  --  6.9  LATICACIDVEN  --   --  4.4*  --  3.2*  --     Liver Function Tests: Recent Labs  Lab 11/26/23 0444 11/27/23 0812 11/30/23 0605 12/01/23 2115 12/02/23 0646  AST 25 20 21  35 20  ALT 16 17 17 18 14   ALKPHOS 73 67 81 76 52  BILITOT 0.6  0.5 0.5 0.7 1.0  PROT 7.3 6.9 6.8 7.0 5.4*  ALBUMIN  3.2* 3.6 3.4* 3.5 2.4*   No results for input(s): LIPASE, AMYLASE in the last 168 hours. No results for  input(s): AMMONIA in the last 168 hours.  ABG    Component Value Date/Time   PHART 7.433 12/02/2023 0535   PCO2ART 32.3 12/02/2023 0535   PO2ART 65 (L) 12/02/2023 0535   HCO3 21.5 12/02/2023 0535   TCO2 23 12/02/2023 0535   ACIDBASEDEF 2.0 12/02/2023 0535   O2SAT 93 12/02/2023 0535     Coagulation Profile: Recent Labs  Lab 12/01/23 2130  INR 0.9    Cardiac Enzymes: No results for input(s): CKTOTAL, CKMB, CKMBINDEX, TROPONINI in the last 168 hours.  HbA1C: Hgb A1c MFr Bld  Date/Time Value Ref Range Status  11/05/2023 04:30 AM 6.5 (H) 4.8 - 5.6 % Final    Comment:    (NOTE) Pre diabetes:          5.7%-6.4%  Diabetes:              >6.4%  Glycemic control for   <7.0% adults with diabetes   01/11/2023 09:00 AM 7.3 (H) 4.8 - 5.6 % Final    Comment:    (NOTE) Pre diabetes:          5.7%-6.4%  Diabetes:              >6.4%  Glycemic control for   <7.0% adults with diabetes     CBG: No results for input(s): GLUCAP in the last 168 hours.  Review of Systems:    Positive Symptoms in bold:  Constitutional fevers, chills, weight loss, fatigue, anorexia, malaise  Eyes decreased vision, double vision, eye irritation  Ears, Nose, Mouth, Throat sore throat, trouble swallowing, sinus congestion  Cardiovascular chest pain, paroxysmal nocturnal dyspnea, lower ext edema, palpitations   Respiratory SOB, cough, DOE, hemoptysis, wheezing  Gastrointestinal nausea, vomiting, diarrhea  Genitourinary burning with urination, trouble urinating  Musculoskeletal joint aches, joint swelling, back pain  Integumentary  rashes, skin lesions  Neurological focal weakness, focal numbness, trouble speaking, headaches  Psychiatric depression, anxiety, confusion  Endocrine polyuria, polydipsia, cold intolerance, heat  intolerance  Hematologic abnormal bruising, abnormal bleeding, unexplained nose bleeds  Allergic/Immunologic recurrent infections, hives, swollen lymph nodes     Past Medical History:  She,  has a past medical history of Abdominal pain, Accidental drug overdose (April 2013), Anxiety, Atrial fibrillation (HCC) (09/29/11), Chronic back pain, Chronic knee pain, Chronic nausea, Chronic pain, COPD (chronic obstructive pulmonary disease) (HCC), Depression, Diabetes mellitus, Diabetic neuropathy (HCC), Dyspnea, GERD (gastroesophageal reflux disease), Headache(784.0), HTN (hypertension), Hyperlipidemia, Hypothyroidism, Mental disorder, Nausea and vomiting (01/02/2023), Requires supplemental oxygen , Schizophrenia (HCC), Schizophrenia, acute (HCC) (11/13/2017), and Tobacco abuse.   Surgical History:   Past Surgical History:  Procedure Laterality Date   ABDOMINAL HYSTERECTOMY     BLADDER SUSPENSION  03/04/2011   Procedure: [facility] PROCEDURE;  Surgeon: Jackelyn Marvel MacDiarmid;  Location: [facility];  Service: Urology;  Laterality: N/A;   BOWEL RESECTION N/A 04/18/2022   Procedure: SMALL BOWEL RESECTION;  Surgeon: Sim Dryer, MD;  Location: [facility] OR;  Service: General;  Laterality: N/A;   CYSTOCELE REPAIR  03/04/2011   Procedure: ANTERIOR REPAIR (CYSTOCELE);  Surgeon: Devorah Fonder;  Location: [facility];  Service: Urology;  Laterality: N/A;   CYSTOSCOPY  03/04/2011   Procedure: CYSTOSCOPY;  Surgeon: Jackelyn Marvel MacDiarmid;  Location: [facility];  Service: Urology;  Laterality: N/A;   ESOPHAGOGASTRODUODENOSCOPY (EGD) WITH PROPOFOL  N/A 05/12/2017   Procedure: ESOPHAGOGASTRODUODENOSCOPY (EGD) WITH PROPOFOL ;  Surgeon: Juanita Norlander, MD;  Location: [facility] END[facility];  Service: General;  Laterality: N/A;   GASTRIC ROUX-EN-Y N/A 03/25/2016   Procedure: LAPAROSCOPIC ROUX-EN-Y GASTRIC  BYPASS WITH UPPER ENDOSCOPY;  Surgeon: Ayesha Lente, MD;  Location: [facility];  Service: General;  Laterality: N/A;   KNEE SURGERY     LAPAROSCOPIC  ASSISTED VAGINAL HYSTERECTOMY  03/04/2011   Procedure: LAPAROSCOPIC ASSISTED VAGINAL HYSTERECTOMY;  Surgeon: Martine Sleek, MD;  Location: [facility];  Service: Gynecology;  Laterality: N/A;   LAPAROTOMY N/A 04/18/2022   Procedure: EXPLORATORY LAPAROTOMY;  Surgeon: Sim Dryer, MD;  Location: [facility] OR;  Service: General;  Laterality: N/A;   LAPAROTOMY N/A 04/24/2022   Procedure: BRING BACK EXPLORATORY LAPAROTOMY;  Surgeon: Dorena Gander, MD;  Location: [facility] OR;  Service: General;  Laterality: N/A;   TOTAL HIP ARTHROPLASTY Right 08/27/2022   Procedure: TOTAL HIP ARTHROPLASTY;  Surgeon: Murleen Arms, MD;  Location: [facility] OR;  Service: Orthopedics;  Laterality: Right;     Social History:   reports that she has been smoking cigarettes. She has never used smokeless tobacco. She reports current alcohol  use. She reports current drug use. Drugs: Cocaine, Marijuana, and Crack cocaine.   Family History:  Her family history includes COPD in an other family member; Diabetes in her mother; Heart attack in her father and sister; Heart disease in her mother; Hypertension in her mother. There is no history of Breast cancer.   Allergies Allergies  Allergen Reactions   Iron  Dextran Shortness Of Breath and Anxiety   Aspirin  Nausea And Vomiting and Other (See Comments)    Ok to take tylenol  or ibuprofen     Penicillins Other (See Comments)    Unknown reaction from childhood: family would like this to remain as an allergy.     Home Medications  Prior to Admission medications   Medication Sig Start Date End Date Taking? Authorizing Provider  albuterol  (VENTOLIN  HFA) 108 (90 Base) MCG/ACT inhaler Inhale 2 puffs into the lungs every 4 (four) hours as needed for wheezing or shortness of breath. 11/30/23  Yes Guadalupe Lee, MD  ARIPiprazole  (ABILIFY ) 10 MG tablet Take 1 tablet (10 mg total) by mouth daily. 11/01/23  Yes Samtani, Jai-Gurmukh, MD  divalproex  (DEPAKOTE  SPRINKLE) 125 MG capsule Take 2 capsules  (250 mg total) by mouth every 12 (twelve) hours. 11/14/23  Yes Ghimire, Estil Heman, MD  fluticasone  furoate-vilanterol (BREO ELLIPTA ) 100-25 MCG/ACT AEPB Inhale 1 puff into the lungs daily. 11/14/23  Yes Ghimire, Estil Heman, MD  ipratropium-albuterol  (DUONEB) 0.5-2.5 (3) MG/3ML SOLN Take 3 mLs by nebulization every 6 (six) hours as needed (for shortness of breath or wheezing).   Yes [provider]  OXYGEN  Inhale 4-5 L/min into the lungs daily as needed (for COPD-related symptoms).   Yes [provider]  predniSONE  (DELTASONE ) 20 MG tablet Take 2 tablets (40 mg total) by mouth daily. 12/01/23  Yes Guadalupe Lee, MD  thiamine  (VITAMIN B-1) 100 MG tablet Take 1 tablet (100 mg total) by mouth daily. 11/14/23  Yes Ghimire, Estil Heman, MD  umeclidinium bromide  (INCRUSE ELLIPTA ) 62.5 MCG/ACT AEPB Inhale 1 puff into the lungs daily. 11/14/23  Yes Ghimire, Estil Heman, MD  zonisamide  (ZONEGRAN ) 100 MG capsule Take 2 capsules (200 mg total) by mouth daily. 10/31/23  Yes Samtani, Jai-Gurmukh, MD  levETIRAcetam  (KEPPRA  XR) 500 MG 24 hr tablet Take 1 tablet (500 mg total) by mouth 2 (two) times daily. 10/31/23   Samtani, Jai-Gurmukh, MD  metFORMIN  (GLUCOPHAGE -XR) 750 MG 24 hr tablet Take 1 tablet (750 mg total) by mouth daily with breakfast. Patient not taking: Reported on 12/01/2023 10/31/23   Samtani, Jai-Gurmukh, MD     Critical  care time: 38 mins independent of procedures

## 2023-12-02 NOTE — ED Notes (Signed)
 Provider at bedside requested to d/c Bipap and trial patient on [place].   Bipap paused at this time.

## 2023-12-02 NOTE — ED Notes (Addendum)
 This paramedic went to bedside for shift assessment. Pt noted to have labored breathing and pt pulled her bipap off. Bipap re-applied and pt encouraged to be compliant with interventions to assist with her breathing.   Pt was boosted in bed and given clean linen.

## 2023-12-02 NOTE — ED Notes (Signed)
 Pt getting worse, work breathing getting faster, saturation dropping, [facility]  Hospitalist made aware.

## 2023-12-02 NOTE — Significant Event (Signed)
 Patient's nurse notified me that patient is becoming more short of breath and dyspneic on 6 L oxygen  now. On exam at bedside patient appears short of breath and tachypneic.  I reviewed patient's labs medications notes.  Blood pressure is 98/56 pulse is 120/min temperature is 98.9 respiration is 40/min. HEENT anicteric no pallor. Heart S1-S2 heard. Chest expiratory wheeze heard bilaterally.  ABG shows pH of 7.43PCO2 of 32.3 PO2 of 65.  Assessment  Assessment and plan -     Acute respiratory failure with hypoxia on 6 L I have placed patient on BiPAP.  Stat chest x-ray has been ordered.  Will continue with patient's scheduled and as needed nebulizer.  Continue with empiric antibiotics already ordered by admitting physician.  Will discuss with pulmonary critical care.  Melford Spotted

## 2023-12-02 NOTE — Plan of Care (Signed)
   Problem: Fluid Volume: Goal: Hemodynamic stability will improve Outcome: Progressing   Problem: Clinical Measurements: Goal: Diagnostic test results will improve Outcome: Progressing Goal: Signs and symptoms of infection will decrease Outcome: Progressing   Problem: Respiratory: Goal: Ability to maintain adequate ventilation will improve Outcome: Progressing

## 2023-12-02 NOTE — Progress Notes (Signed)
 [name] [id number] Admission Data: 12/02/2023 12:28 PM Attending Provider: Josiah Nigh, MD  NWG:[id number], No Pcp Per Consults/ Treatment Team:   [name] is a 58 y.o. female patient admitted from ED, alert to self,  Full Code, ,intubated  Tele # [id number] placed and pt is currently running:normal sinus rhythm.   IV site WDL:  hand left, condition patent and no redness with a transparent dsg that's clean dry and intact.   Skin, clean-dry- intact without evidence of bruising, or skin tears.   No evidence of skin break down noted on exam. no rashes, no ecchymoses, no petechiae Patient admitted to [facility].    Will cont to monitor and assist as needed.  Chanda Combes, [place] 12/02/2023 12:28 PM

## 2023-12-03 DIAGNOSIS — A419 Sepsis, unspecified organism: Secondary | ICD-10-CM | POA: Diagnosis not present

## 2023-12-03 DIAGNOSIS — G9341 Metabolic encephalopathy: Secondary | ICD-10-CM | POA: Diagnosis not present

## 2023-12-03 DIAGNOSIS — J449 Chronic obstructive pulmonary disease, unspecified: Secondary | ICD-10-CM

## 2023-12-03 DIAGNOSIS — J690 Pneumonitis due to inhalation of food and vomit: Secondary | ICD-10-CM

## 2023-12-03 DIAGNOSIS — E872 Acidosis, unspecified: Secondary | ICD-10-CM | POA: Diagnosis not present

## 2023-12-03 DIAGNOSIS — R6520 Severe sepsis without septic shock: Secondary | ICD-10-CM | POA: Diagnosis not present

## 2023-12-03 LAB — CBC
HCT: 27.4 % — ABNORMAL LOW (ref 36.0–46.0)
Hemoglobin: 7.5 g/dL — ABNORMAL LOW (ref 12.0–15.0)
MCH: 21.1 pg — ABNORMAL LOW (ref 26.0–34.0)
MCHC: 27.4 g/dL — ABNORMAL LOW (ref 30.0–36.0)
MCV: 77.2 fL — ABNORMAL LOW (ref 80.0–100.0)
Platelets: 134 10*3/uL — ABNORMAL LOW (ref 150–400)
RBC: 3.55 MIL/uL — ABNORMAL LOW (ref 3.87–5.11)
RDW: 21.2 % — ABNORMAL HIGH (ref 11.5–15.5)
WBC: 22.8 10*3/uL — ABNORMAL HIGH (ref 4.0–10.5)
nRBC: 0 % (ref 0.0–0.2)

## 2023-12-03 LAB — GLUCOSE, CAPILLARY
Glucose-Capillary: 153 mg/dL — ABNORMAL HIGH (ref 70–99)
Glucose-Capillary: 154 mg/dL — ABNORMAL HIGH (ref 70–99)
Glucose-Capillary: 162 mg/dL — ABNORMAL HIGH (ref 70–99)
Glucose-Capillary: 170 mg/dL — ABNORMAL HIGH (ref 70–99)
Glucose-Capillary: 181 mg/dL — ABNORMAL HIGH (ref 70–99)
Glucose-Capillary: 190 mg/dL — ABNORMAL HIGH (ref 70–99)

## 2023-12-03 LAB — TRIGLYCERIDES: Triglycerides: 26 mg/dL (ref <150)

## 2023-12-03 LAB — BASIC METABOLIC PANEL WITH GFR
Anion gap: 7 (ref 5–15)
BUN: 6 mg/dL (ref 6–20)
CO2: 31 mmol/L (ref 22–32)
Calcium: 8 mg/dL — ABNORMAL LOW (ref 8.9–10.3)
Chloride: 105 mmol/L (ref 98–111)
Creatinine, Ser: 0.43 mg/dL — ABNORMAL LOW (ref 0.44–1.00)
GFR, Estimated: >60 mL/min (ref >60)
Glucose, Bld: 170 mg/dL — ABNORMAL HIGH (ref 70–99)
Potassium: 3.5 mmol/L (ref 3.5–5.1)
Sodium: 143 mmol/L (ref 135–145)

## 2023-12-03 LAB — PHOSPHORUS: Phosphorus: 2.6 mg/dL (ref 2.5–4.6)

## 2023-12-03 LAB — MAGNESIUM: Magnesium: 2.1 mg/dL (ref 1.7–2.4)

## 2023-12-03 LAB — STREP PNEUMONIAE URINARY ANTIGEN: Strep Pneumo Urinary Antigen: NEGATIVE

## 2023-12-03 MED ORDER — VITAL HIGH PROTEIN PO LIQD: 1000.0000 mL | ORAL | Status: DC

## 2023-12-03 MED ORDER — FOLIC ACID 1 MG PO TABS
1.0000 mg | ORAL_TABLET | Freq: Every day | ORAL | Status: DC
  Administered 2023-12-03 – 2023-12-04 (×2): 1 mg
  Filled 2023-12-03 (×3): qty 1

## 2023-12-03 MED ORDER — QUETIAPINE FUMARATE 25 MG PO TABS
25.0000 mg | ORAL_TABLET | Freq: Once | ORAL | Status: AC
  Administered 2023-12-03: 25 mg via ORAL
  Filled 2023-12-03: qty 1

## 2023-12-03 MED ORDER — DEXMEDETOMIDINE HCL IN NACL 400 MCG/100ML IV SOLN
0.0000 ug/kg/h | INTRAVENOUS | Status: DC
  Administered 2023-12-03 – 2023-12-04 (×2): 0.4 ug/kg/h via INTRAVENOUS
  Administered 2023-12-04: 0.6 ug/kg/h via INTRAVENOUS
  Administered 2023-12-05: 0.5 ug/kg/h via INTRAVENOUS
  Filled 2023-12-03: qty 100
  Filled 2023-12-03: qty 200
  Filled 2023-12-03: qty 100

## 2023-12-03 MED ORDER — PROSOURCE TF20 ENFIT COMPATIBL EN LIQD
60.0000 mL | Freq: Every day | ENTERAL | Status: DC
  Administered 2023-12-03 – 2023-12-04 (×2): 60 mL
  Filled 2023-12-03 (×5): qty 60

## 2023-12-03 MED ORDER — OSMOLITE 1.2 CAL PO LIQD
1000.0000 mL | ORAL | Status: DC
  Administered 2023-12-03 – 2023-12-04 (×2): 1000 mL
  Filled 2023-12-03 (×6): qty 1000

## 2023-12-03 MED ORDER — GUAIFENESIN 100 MG/5ML PO LIQD
20.0000 mL | Freq: Three times a day (TID) | ORAL | Status: DC
  Administered 2023-12-03 – 2023-12-04 (×4): 20 mL
  Filled 2023-12-03 (×6): qty 20

## 2023-12-03 MED ORDER — PREDNISONE 20 MG PO TABS
40.0000 mg | ORAL_TABLET | Freq: Every day | ORAL | Status: DC
  Administered 2023-12-04: 40 mg
  Filled 2023-12-03: qty 2

## 2023-12-03 MED ORDER — POTASSIUM CHLORIDE 10 MEQ/100ML IV SOLN
10.0000 meq | INTRAVENOUS | Status: AC
  Administered 2023-12-03 (×4): 10 meq via INTRAVENOUS
  Filled 2023-12-03 (×4): qty 100

## 2023-12-03 NOTE — Progress Notes (Signed)
 Initial Nutrition Assessment  DOCUMENTATION CODES:   Not applicable  INTERVENTION:  If extubation not achieved, recommend initiation of enteral nutrition. Recommendations as below: Start Osmolite 1.2 at 20ml/hr and advance by 10ml q8h to goal rate of 76ml/hr ( per day) 60ml ProSource TF20 once daily Provides 1520 kcal, 87g protein and free water     MVI with minerals daily  NUTRITION DIAGNOSIS:   Inadequate oral intake related to acute illness as evidenced by NPO status.  GOAL:   Patient will meet greater than or equal to 90% of their needs  MONITOR:   Vent status, Labs, Weight trends, TF tolerance  REASON FOR ASSESSMENT:   Ventilator    ASSESSMENT:   Pt seen multiple times in the ER within the last week with c/o SOB, normal CXR on exam. Currently admitted with new hypoxemia, lactic acidosis, and RLL infiltrate w/effusion. PMH significant for cocaine/alcohol  abuse, anxiety/schizophrenia, COPD, seizure disorder, gastric bypass with hx micronutrient deficiencies and malnutrition  6/11: intubated, s/p bronchoscopy   Patient is currently intubated on ventilator support MV: 9.6 L/min Temp (24hrs), Avg:99.9 F (37.7 C), Min:99 F (37.2 C), Max:100.6 F (38.1 C)  Agitation likely primary barrier to discharge. Weaning sedation to work toward extubation.  Discussed wit NP, if unable to extubate plan to initiate enteral nutrition.   Reviewed weight history. Unable to adequately assess weight trends within the last year. Suspect most weights to be stated versus actual weight. Will continue to monitor throughout admission.   Medications: colace BID, folvite , SSI 0-15 units q4h, MVI, miralax  daily, sodium bicarbonate , thiamine , IV abx Drips: Levo @ 4 mcg/min Potassium chloride  Propofol  @5 .22 ml/hr  Labs:   CBG's 154-185 x24 hours  UOP: 3L x24 hours OGT tip within stomach  NUTRITION - FOCUSED PHYSICAL EXAM: RD working remotely. Deferred to follow up.    Diet Order:   Diet Order             Diet NPO time specified Except for: Ice Chips, Sips with Meds  Diet effective now                   EDUCATION NEEDS:   No education needs have been identified at this time  Skin:  Skin Assessment: Reviewed RN Assessment  Last BM:  unknown/PTA  Height:   Ht Readings from Last 1 Encounters:  12/01/23 5' 2 (1.575 m)    Weight:   Wt Readings from Last 1 Encounters:  12/01/23 58 kg   BMI:  Body mass index is 23.39 kg/m.  Estimated Nutritional Needs:   Kcal:  1500-1700  Protein:  75-90g  Fluid:  >/=1.5L  Rocklin Chute, RDN, LDN Clinical Nutrition See AMiON for contact information.

## 2023-12-03 NOTE — Progress Notes (Addendum)
 NAME:  [name], MRN:  [id number], DOB:  1966-04-17, LOS: 2 ADMISSION DATE:  12/01/2023, CONSULTATION DATE:  12/02/23 REFERRING MD:  TRH, CHIEF COMPLAINT:  SOB   History of Present Illness:  58 year old woman w/ hx of cocaine/alcohol  abuse, anxiety/schizophrenia, COPD, seizure disorder presenting with worsening SOB, RUQ and R lower chest pain over past several days.  Seen in ER for this issue a couple times in past week with normal CXR.  This ER visit, new hypoxemia, lactic acidosis, and RLL infiltrate w/ effusion  Started on CAP coverage but worsening BP and oxygenation so PCCM consulted.  ROS as below  Pertinent  Medical History   Past Medical History:  Diagnosis Date   Abdominal pain    Accidental drug overdose April 2013   Anxiety    Atrial fibrillation (HCC) 09/29/11   converted spontaneously   Chronic back pain    Chronic knee pain    Chronic nausea    Chronic pain    COPD (chronic obstructive pulmonary disease) (HCC)    Depression    Diabetes mellitus    states her doctor took her off all DM meds in past month   Diabetic neuropathy (HCC)    Dyspnea    with exertion    GERD (gastroesophageal reflux disease)    Headache(784.0)    migraines    HTN (hypertension)    not on meds since in a year    Hyperlipidemia    Hypothyroidism    not on meds in a while    Mental disorder    Bipolar and schizophrenic   Nausea and vomiting 01/02/2023   Requires supplemental oxygen     as needed per patient    Schizophrenia (HCC)    Schizophrenia, acute (HCC) 11/13/2017   Tobacco abuse      Significant Hospital Events: Including procedures, antibiotic start and stop dates in addition to other pertinent events   Multiple ER visits for various complaints noted, 20+ this year 6/11 intubated lined pressors bronch/BAL  6/12 weaning pressors, changing sedation    Interim History / Subjective:  NAEO  Labs this morning w WBC 22 hgb 7.5 K suboptimal at 3.5   Became  agitated this morning and is in restraints now    Small volumes on PSV but is on 100 fent 25 prop   Objective    Blood pressure 132/66, pulse 81, temperature (!) 100.4 F (38 C), resp. rate (!) 23, height 5' 2 (1.575 m), weight 58 kg, last menstrual period 01/08/2011, SpO2 98%.    Vent Mode: PRVC FiO2 (%):  [60 %-100 %] 60 % Set Rate:  [26 bmp] 26 bmp Vt Set:  [400 mL] 400 mL PEEP:  [5 cmH20] 5 cmH20 Plateau Pressure:  [19 cmH20-21 cmH20] 21 cmH20   Intake/Output Summary (Last 24 hours) at 12/03/2023 1044 Last data filed at 12/03/2023 0900 Gross per 24 hour  Intake 2586.8 ml  Output 3255 ml  Net -668.2 ml   Filed Weights   12/01/23 2044  Weight: 58 kg    Examination:  General: chronically and critically ill adult F appears older than stated age intubated sedated  HENT: ETT secure anicteric sclera  Lungs: No wheeze. Scattered rhonchi  Cardiovascular: rrr  Abdomen: soft ndnt  Extremities: BUE restraints no acute joint deformity  Neuro: Sedated, awakens to tactile stim, moves all 4 and is agitated, does not follow commands  Skin: c/d/w  Assessment and Plan    Acute metabolic encephalopathy Polysubstance abuse  Hx seizures  Hx schizophrenia, anxiety Medication non adherence  Septic shock due to RLL CAP v aspiration PNA Acute hypoxic respiratory failure  COPD R pleural effusion, small and not amenable to procedure Borderline hypokalemia  P -cont broad abx  including 3d azithro -wean pressors, MAP goal 65 -dc solucortef, add pred 40  -follow micro data  -starting precedex ; hoping we can wean prop/fent and work toward extubation -- think agitation is going to be bigger barrier than pulm mechanics. -will give 1x seroquel  per tube  -cont home abilify  -cont home VPA and keppra   -thiamine  -triple therapy  -replace K  -psych consult when extubated -- last admission they appreciated knowing she was here -high ER utilization, need to figure out a big picture plan to  help her maintain out of hospital     Best Practice (right click and Reselect all SmartList Selections daily)   Diet/type: NPO DVT prophylaxis LMWH Pressure ulcer(s): N/A GI prophylaxis: PPI Lines: Central line Foley:  Yes, and it is still needed Code Status:  full code Last date of multidisciplinary goals of care discussion [updated daughter by phone, patient at bedside although she is pretty agitated/confused/tachypneic]  Labs   CBC: Recent Labs  Lab 11/30/23 0605 11/30/23 0611 12/01/23 0535 12/01/23 2104 12/02/23 0105 12/02/23 0535 12/02/23 0646 12/02/23 0912 12/02/23 1711 12/03/23 0436  WBC 4.8  --  5.8 15.5* 2.5*  --  6.9  --   --  22.8*  NEUTROABS 3.4  --  4.1 12.9*  --   --  4.7  --   --   --   HGB 9.1*   < > 8.4* 9.8* 8.5* 9.2* 8.5* 9.2* 8.8* 7.5*  HCT 31.8*   < > 29.0* 34.3* 30.0* 27.0* 30.4* 27.0* 26.0* 27.4*  MCV 75.7*  --  73.4* 76.1* 74.8*  --  77.0*  --   --  77.2*  PLT 250  --  216 176 114*  --  151  --   --  134*   < > = values in this interval not displayed.    Basic Metabolic Panel: Recent Labs  Lab 11/30/23 0605 11/30/23 0272 12/01/23 0535 12/01/23 2115 12/02/23 0105 12/02/23 0535 12/02/23 0646 12/02/23 0912 12/02/23 1711 12/03/23 0436  NA 138   < > 138 135 135 136 136 139 142 143  K 4.0   < > 4.2 4.8 4.4 4.1 4.2 3.7 3.7 3.5  CL 103  --  106 101 105  --  104  --   --  105  CO2 20*  --  22 20* 19*  --  24  --   --  31  GLUCOSE 227*  --  213* 165* 148*  --  199*  --   --  170*  BUN <5*  --  6 6 7   --  9  --   --  6  CREATININE 0.40*  --  0.59 0.58 0.63  --  0.67  --   --  0.43*  CALCIUM  8.7*  --  8.1* 9.0 7.9*  --  7.8*  --   --  8.0*  MG 2.0  --   --   --   --   --   --   --   --  2.1  PHOS  --   --   --   --   --   --   --   --   --  2.6   < > = values in  this interval not displayed.   GFR: Estimated Creatinine Clearance: 59.9 mL/min (A) (by C-G formula based on SCr of 0.43 mg/dL (L)). Recent Labs  Lab 12/01/23 2104  12/01/23 2106 12/02/23 0105 12/02/23 0110 12/02/23 0646 12/02/23 1058 12/02/23 1141 12/02/23 1143 12/02/23 1434 12/02/23 1635 12/03/23 0436  PROCALCITON  --   --   --   --   --   --   --  4.45  --   --   --   WBC 15.5*  --  2.5*  --  6.9  --   --   --   --   --  22.8*  LATICACIDVEN  --    < >  --    < >  --  3.9* 3.7*  --  2.6* 2.3*  --    < > = values in this interval not displayed.    Liver Function Tests: Recent Labs  Lab 11/27/23 0812 11/30/23 0605 12/01/23 2115 12/02/23 0646  AST 20 21 35 20  ALT 17 17 18 14   ALKPHOS 67 81 76 52  BILITOT 0.5 0.5 0.7 1.0  PROT 6.9 6.8 7.0 5.4*  ALBUMIN  3.6 3.4* 3.5 2.4*   No results for input(s): LIPASE, AMYLASE in the last 168 hours. No results for input(s): AMMONIA in the last 168 hours.  ABG    Component Value Date/Time   PHART 7.338 (L) 12/02/2023 1711   PCO2ART 55.3 (H) 12/02/2023 1711   PO2ART 137 (H) 12/02/2023 1711   HCO3 29.5 (H) 12/02/2023 1711   TCO2 31 12/02/2023 1711   ACIDBASEDEF 2.0 12/02/2023 0535   O2SAT 99 12/02/2023 1711     Coagulation Profile: Recent Labs  Lab 12/01/23 2130  INR 0.9    Cardiac Enzymes: No results for input(s): CKTOTAL, CKMB, CKMBINDEX, TROPONINI in the last 168 hours.  HbA1C: Hgb A1c MFr Bld  Date/Time Value Ref Range Status  11/05/2023 04:30 AM 6.5 (H) 4.8 - 5.6 % Final    Comment:    (NOTE) Pre diabetes:          5.7%-6.4%  Diabetes:              >6.4%  Glycemic control for   <7.0% adults with diabetes   01/11/2023 09:00 AM 7.3 (H) 4.8 - 5.6 % Final    Comment:    (NOTE) Pre diabetes:          5.7%-6.4%  Diabetes:              >6.4%  Glycemic control for   <7.0% adults with diabetes     CBG: Recent Labs  Lab 12/02/23 1542 12/02/23 1923 12/02/23 2336 12/03/23 0406 12/03/23 0801  GLUCAP 115* 185* 144* 154* 170*      CRITICAL CARE Performed by: Delories Fetter   Total critical care time: 39 minutes  Critical care time was exclusive  of separately billable procedures and treating other patients. Critical care was necessary to treat or prevent imminent or life-threatening deterioration.  Critical care was time spent personally by me on the following activities: development of treatment plan with patient and/or surrogate as well as nursing, discussions with consultants, evaluation of patient's response to treatment, examination of patient, obtaining history from patient or surrogate, ordering and performing treatments and interventions, ordering and review of laboratory studies, ordering and review of radiographic studies, pulse oximetry and re-evaluation of patient's condition.   Eston Hence MSN, AGACNP-BC [place] Amion for pager  12/03/2023, 10:44 AM

## 2023-12-04 ENCOUNTER — Inpatient Hospital Stay (HOSPITAL_COMMUNITY)

## 2023-12-04 DIAGNOSIS — J690 Pneumonitis due to inhalation of food and vomit: Secondary | ICD-10-CM | POA: Diagnosis not present

## 2023-12-04 DIAGNOSIS — G9341 Metabolic encephalopathy: Secondary | ICD-10-CM | POA: Diagnosis not present

## 2023-12-04 DIAGNOSIS — R6520 Severe sepsis without septic shock: Secondary | ICD-10-CM | POA: Diagnosis not present

## 2023-12-04 DIAGNOSIS — E876 Hypokalemia: Secondary | ICD-10-CM

## 2023-12-04 DIAGNOSIS — A419 Sepsis, unspecified organism: Secondary | ICD-10-CM | POA: Diagnosis not present

## 2023-12-04 LAB — BASIC METABOLIC PANEL WITH GFR
Anion gap: 8 (ref 5–15)
BUN: 8 mg/dL (ref 6–20)
CO2: 29 mmol/L (ref 22–32)
Calcium: 8.5 mg/dL — ABNORMAL LOW (ref 8.9–10.3)
Chloride: 105 mmol/L (ref 98–111)
Creatinine, Ser: 0.52 mg/dL (ref 0.44–1.00)
GFR, Estimated: >60 mL/min (ref >60)
Glucose, Bld: 166 mg/dL — ABNORMAL HIGH (ref 70–99)
Potassium: 3.2 mmol/L — ABNORMAL LOW (ref 3.5–5.1)
Sodium: 142 mmol/L (ref 135–145)

## 2023-12-04 LAB — GLUCOSE, CAPILLARY
Glucose-Capillary: 148 mg/dL — ABNORMAL HIGH (ref 70–99)
Glucose-Capillary: 182 mg/dL — ABNORMAL HIGH (ref 70–99)
Glucose-Capillary: 247 mg/dL — ABNORMAL HIGH (ref 70–99)
Glucose-Capillary: 202 mg/dL — ABNORMAL HIGH (ref 70–99)
Glucose-Capillary: 206 mg/dL — ABNORMAL HIGH (ref 70–99)
Glucose-Capillary: 175 mg/dL — ABNORMAL HIGH (ref 70–99)

## 2023-12-04 LAB — CBC
HCT: 28.5 % — ABNORMAL LOW (ref 36.0–46.0)
Hemoglobin: 7.8 g/dL — ABNORMAL LOW (ref 12.0–15.0)
MCH: 21.1 pg — ABNORMAL LOW (ref 26.0–34.0)
MCHC: 27.4 g/dL — ABNORMAL LOW (ref 30.0–36.0)
MCV: 77.2 fL — ABNORMAL LOW (ref 80.0–100.0)
Platelets: 111 10*3/uL — ABNORMAL LOW (ref 150–400)
RBC: 3.69 MIL/uL — ABNORMAL LOW (ref 3.87–5.11)
RDW: 21.4 % — ABNORMAL HIGH (ref 11.5–15.5)
WBC: 17.6 10*3/uL — ABNORMAL HIGH (ref 4.0–10.5)
nRBC: 0 % (ref 0.0–0.2)

## 2023-12-04 LAB — MAGNESIUM: Magnesium: 2.3 mg/dL (ref 1.7–2.4)

## 2023-12-04 LAB — PHOSPHORUS
Phosphorus: 1.5 mg/dL — ABNORMAL LOW (ref 2.5–4.6)
Phosphorus: 3.5 mg/dL (ref 2.5–4.6)

## 2023-12-04 MED ORDER — QUETIAPINE FUMARATE 25 MG PO TABS
25.0000 mg | ORAL_TABLET | Freq: Once | ORAL | Status: AC
  Administered 2023-12-04: 25 mg via ORAL
  Filled 2023-12-04: qty 1

## 2023-12-04 MED ORDER — POTASSIUM PHOSPHATES 15 MMOLE/5ML IV SOLN
45.0000 mmol | Freq: Once | INTRAVENOUS | Status: AC
  Administered 2023-12-04: 45 mmol via INTRAVENOUS
  Filled 2023-12-04: qty 15

## 2023-12-04 MED ORDER — LORAZEPAM 1 MG PO TABS
1.0000 mg | ORAL_TABLET | ORAL | Status: DC | PRN
  Administered 2023-12-05: 2 mg
  Filled 2023-12-04: qty 2

## 2023-12-04 MED ORDER — MUPIROCIN 2 % EX OINT
1.0000 | TOPICAL_OINTMENT | Freq: Two times a day (BID) | CUTANEOUS | Status: AC
Start: 2023-12-04 — End: 2023-12-09
  Administered 2023-12-04 – 2023-12-08 (×8): 1 via NASAL
  Filled 2023-12-04 (×4): qty 22

## 2023-12-04 MED ORDER — FUROSEMIDE 10 MG/ML IJ SOLN
40.0000 mg | Freq: Once | INTRAMUSCULAR | Status: AC
  Administered 2023-12-04: 40 mg via INTRAVENOUS
  Filled 2023-12-04: qty 4

## 2023-12-04 MED ORDER — PREDNISONE 20 MG PO TABS
20.0000 mg | ORAL_TABLET | Freq: Every day | ORAL | Status: DC
  Administered 2023-12-05: 20 mg
  Filled 2023-12-04: qty 1

## 2023-12-04 MED ORDER — ORAL CARE MOUTH RINSE: 15.0000 mL | OROMUCOSAL | Status: DC | PRN

## 2023-12-04 MED ORDER — LORAZEPAM 2 MG/ML IJ SOLN
1.0000 mg | INTRAMUSCULAR | Status: DC | PRN
  Administered 2023-12-04: 3 mg via INTRAVENOUS
  Filled 2023-12-04: qty 2

## 2023-12-04 MED ORDER — ORAL CARE MOUTH RINSE
15.0000 mL | OROMUCOSAL | Status: DC
  Administered 2023-12-04 – 2023-12-08 (×10): 15 mL via OROMUCOSAL

## 2023-12-04 NOTE — Progress Notes (Signed)
 [facility] ADULT ICU REPLACEMENT PROTOCOL   The patient does apply for the [facility] Adult ICU Electrolyte Replacment Protocol based on the criteria listed below:   1.Exclusion criteria: TCTS, ECMO, Dialysis, and Myasthenia Gravis patients 2. Is GFR >/= 30 ml/min? Yes.    Patient's GFR today is >60 3. Is SCr </= 2? Yes.   Patient's SCr is 0.52 mg/dL 4. Did SCr increase >/= 0.5 in 24 hours? No. 5.Pt's weight >40kg  Yes.   6. Abnormal electrolyte(s):   K 3.3, Phos 1.5  7. Electrolytes replaced per protocol 8.  Call MD STAT for K+ </= 2.5, Phos </= 1, or Mag </= 1 Physician:  Derrek Flicker R Shad Ledvina 12/04/2023 5:43 AM

## 2023-12-04 NOTE — Progress Notes (Addendum)
 Seen in follow up this afternoon  Cortrak placed Weaning well on vent Some crackles    P -extubate -BiPAP standby -40 lasix   -entering into etoh withdrawal window -- will wean precedex  as able but wouldn't be surprised if she needs this for the next little bit  -will order PRN BZD / CIWA as well     Eston Hence MSN, AGACNP-BC [facility] Pulmonary/Critical Care Medicine 12/04/2023, 3:23 PM

## 2023-12-04 NOTE — Procedures (Signed)
 Extubation Procedure Note  Patient Details:   Name: [name] DOB: [birth year] MRN: [id number]   Airway Documentation:  Airway 7.5 mm (Active)  Secured at (cm) 22 cm 12/04/23 1200  Measured From Lips 12/04/23 1200  Secured Location Right 12/04/23 1200  Secured By [employer] 12/04/23 1200  Bite Block No 12/04/23 0800  Tube Holder Repositioned Yes 12/04/23 0421  Prone position No 12/04/23 0800  Cuff Pressure (cm H2O) Clear OR 27-39 CmH2O 12/04/23 1200  Site Condition Dry 12/04/23 1200   Vent end date: 12/04/23 Vent end time: 1515   Evaluation  O2 sats: stable throughout Complications: No apparent complications Patient did tolerate procedure well. Bilateral Breath Sounds: Diminished, Rhonchi   Yes  Pt extubated to 4 lpm Burt. + cuff leak noted prior to extubation. No stridor noted post.  Claudio Culver 12/04/2023, 3:22 PM

## 2023-12-04 NOTE — Progress Notes (Signed)
 NAME:  [name], MRN:  [id number], DOB:  [birth year], LOS: 3 ADMISSION DATE:  12/01/2023, CONSULTATION DATE:  12/02/23 REFERRING MD:  TRH, CHIEF COMPLAINT:  SOB   History of Present Illness:  58 year old woman w/ hx of cocaine/alcohol  abuse, anxiety/schizophrenia, COPD, seizure disorder presenting with worsening SOB, RUQ and R lower chest pain over past several days.  Seen in ER for this issue a couple times in past week with normal CXR.  This ER visit, new hypoxemia, lactic acidosis, and RLL infiltrate w/ effusion  Started on CAP coverage but worsening BP and oxygenation so PCCM consulted.  ROS as below  Pertinent  Medical History   Past Medical History:  Diagnosis Date   Abdominal pain    Accidental drug overdose April 2013   Anxiety    Atrial fibrillation (HCC) 09/29/11   converted spontaneously   Chronic back pain    Chronic knee pain    Chronic nausea    Chronic pain    COPD (chronic obstructive pulmonary disease) (HCC)    Depression    Diabetes mellitus    states her doctor took her off all DM meds in past month   Diabetic neuropathy (HCC)    Dyspnea    with exertion    GERD (gastroesophageal reflux disease)    Headache(784.0)    migraines    HTN (hypertension)    not on meds since in a year    Hyperlipidemia    Hypothyroidism    not on meds in a while    Mental disorder    Bipolar and schizophrenic   Nausea and vomiting 01/02/2023   Requires supplemental oxygen     as needed per patient    Schizophrenia (HCC)    Schizophrenia, acute (HCC) 11/13/2017   Tobacco abuse      Significant Hospital Events: Including procedures, antibiotic start and stop dates in addition to other pertinent events   Multiple ER visits for various complaints noted, 20+ this year 6/11 intubated lined pressors bronch/BAL  6/12 weaning pressors, changing sedation 6/13 cortrak, hopeful extubation     Interim History / Subjective:  NAEO  Low grade temp this morning 100.4 Off  pressors   WBC 17 K 3.2 phos 1.5  Hgb stable 7.8   Objective    Blood pressure 130/71, pulse 93, temperature (!) 100.6 F (38.1 C), temperature source Bladder, resp. rate (!) 30, height 5' 2 (1.575 m), weight 58 kg, last menstrual period 01/08/2011, SpO2 94%.    Vent Mode: CPAP;PSV FiO2 (%):  [40 %] 40 % Set Rate:  [26 bmp] 26 bmp Vt Set:  [400 mL] 400 mL PEEP:  [5 cmH20] 5 cmH20 Pressure Support:  [8 cmH20] 8 cmH20 Plateau Pressure:  [14 cmH20-18 cmH20] 18 cmH20   Intake/Output Summary (Last 24 hours) at 12/04/2023 1207 Last data filed at 12/04/2023 1200 Gross per 24 hour  Intake 2034.86 ml  Output 1171 ml  Net 863.86 ml   Filed Weights   12/01/23 2044  Weight: 58 kg    Examination:  General: Chronically and critically ill F, appears older than stated age NAD  HENT: NCAT ETT secure anicteric sclera  Lungs: CTAb mechanically ventilated  Cardiovascular: rrr Abdomen: soft ndnt  Extremities: BUE restraints no obviousa cute joint deformity  Neuro: sedated, awakens and follows commands  Skin: c/d/w   Assessment and Plan   Acute metabolic encephalopathy, improving Polysubstance abuse Schizophrenia, anxiety EtOH abuse Hx seizures  P -wean sedation -cont mood stabilizers, AEDs,  antipsychotics -add'l 1x seroquel  while we wean precedex  -will engage psych maybe later today -- last admission they mentioned they appreciate knowing when she's here   Acute hypoxic respiratory failure RLL CAP v aspiration PNA  Sepsis 2/2 PNA above  R pleural effusion -- small, not amenable to procedure  COPD, without exacerbation  P -3d azithro 5d vanc zosyn   -hopeful extubation after cortrak is placed 6/13 -on pred 40 -- will start to wean tomorrow  -triple therapy  -might benefit from   Hypokalemia Hypophosphatemia  P -replace, follow  GOC Medication non-adherence -high fq ER/hospital utilization. Last time I took care of her she had said she doesn't take her home psych meds  as her now dcsd husband used to manage them. Wondering if she she might need facility placement? TOC consult placed   Best Practice (right click and Reselect all SmartList Selections daily)   Diet/type: NPO DVT prophylaxis LMWH Pressure ulcer(s): N/A GI prophylaxis: PPI Lines: Central line -- tried to get PIVs 6/13, no viable targets  Foley:  Yes, and it is no longer needed and removal ordered  Code Status:  full code Last date of multidisciplinary goals of care discussion [-- ]  Labs   CBC: Recent Labs  Lab 11/30/23 0605 11/30/23 0611 12/01/23 0535 12/01/23 2104 12/02/23 0105 12/02/23 0535 12/02/23 0646 12/02/23 0912 12/02/23 1711 12/03/23 0436 12/04/23 0403  WBC 4.8  --  5.8 15.5* 2.5*  --  6.9  --   --  22.8* 17.6*  NEUTROABS 3.4  --  4.1 12.9*  --   --  4.7  --   --   --   --   HGB 9.1*   < > 8.4* 9.8* 8.5*   < > 8.5* 9.2* 8.8* 7.5* 7.8*  HCT 31.8*   < > 29.0* 34.3* 30.0*   < > 30.4* 27.0* 26.0* 27.4* 28.5*  MCV 75.7*  --  73.4* 76.1* 74.8*  --  77.0*  --   --  77.2* 77.2*  PLT 250  --  216 176 114*  --  151  --   --  134* 111*   < > = values in this interval not displayed.    Basic Metabolic Panel: Recent Labs  Lab 11/30/23 0605 11/30/23 0611 12/01/23 2115 12/02/23 0105 12/02/23 0535 12/02/23 0646 12/02/23 0912 12/02/23 1711 12/03/23 0436 12/04/23 0403  NA 138   < > 135 135   < > 136 139 142 143 142  K 4.0   < > 4.8 4.4   < > 4.2 3.7 3.7 3.5 3.2*  CL 103   < > 101 105  --  104  --   --  105 105  CO2 20*   < > 20* 19*  --  24  --   --  31 29  GLUCOSE 227*   < > 165* 148*  --  199*  --   --  170* 166*  BUN <5*   < > 6 7  --  9  --   --  6 8  CREATININE 0.40*   < > 0.58 0.63  --  0.67  --   --  0.43* 0.52  CALCIUM  8.7*   < > 9.0 7.9*  --  7.8*  --   --  8.0* 8.5*  MG 2.0  --   --   --   --   --   --   --  2.1 2.3  PHOS  --   --   --   --   --   --   --   --  2.6 1.5*   < > = values in this interval not displayed.   GFR: Estimated Creatinine  Clearance: 59.9 mL/min (by C-G formula based on SCr of 0.52 mg/dL). Recent Labs  Lab 12/02/23 0105 12/02/23 0110 12/02/23 0646 12/02/23 1058 12/02/23 1141 12/02/23 1143 12/02/23 1434 12/02/23 1635 12/03/23 0436 12/04/23 0403  PROCALCITON  --   --   --   --   --  4.45  --   --   --   --   WBC 2.5*  --  6.9  --   --   --   --   --  22.8* 17.6*  LATICACIDVEN  --    < >  --  3.9* 3.7*  --  2.6* 2.3*  --   --    < > = values in this interval not displayed.    Liver Function Tests: Recent Labs  Lab 11/30/23 0605 12/01/23 2115 12/02/23 0646  AST 21 35 20  ALT 17 18 14   ALKPHOS 81 76 52  BILITOT 0.5 0.7 1.0  PROT 6.8 7.0 5.4*  ALBUMIN  3.4* 3.5 2.4*   No results for input(s): LIPASE, AMYLASE in the last 168 hours. No results for input(s): AMMONIA in the last 168 hours.  ABG    Component Value Date/Time   PHART 7.338 (L) 12/02/2023 1711   PCO2ART 55.3 (H) 12/02/2023 1711   PO2ART 137 (H) 12/02/2023 1711   HCO3 29.5 (H) 12/02/2023 1711   TCO2 31 12/02/2023 1711   ACIDBASEDEF 2.0 12/02/2023 0535   O2SAT 99 12/02/2023 1711     Coagulation Profile: Recent Labs  Lab 12/01/23 2130  INR 0.9    Cardiac Enzymes: No results for input(s): CKTOTAL, CKMB, CKMBINDEX, TROPONINI in the last 168 hours.  HbA1C: Hgb A1c MFr Bld  Date/Time Value Ref Range Status  11/05/2023 04:30 AM 6.5 (H) 4.8 - 5.6 % Final    Comment:    (NOTE) Pre diabetes:          5.7%-6.4%  Diabetes:              >6.4%  Glycemic control for   <7.0% adults with diabetes   01/11/2023 09:00 AM 7.3 (H) 4.8 - 5.6 % Final    Comment:    (NOTE) Pre diabetes:          5.7%-6.4%  Diabetes:              >6.4%  Glycemic control for   <7.0% adults with diabetes     CBG: Recent Labs  Lab 12/03/23 1946 12/03/23 2342 12/04/23 0314 12/04/23 0746 12/04/23 1131  GLUCAP 181* 190* 148* 182* 247*      CRITICAL CARE Performed by: Delories Fetter   Total critical care time: 42  minutes  Critical care time was exclusive of separately billable procedures and treating other patients. Critical care was necessary to treat or prevent imminent or life-threatening deterioration.  Critical care was time spent personally by me on the following activities: development of treatment plan with patient and/or surrogate as well as nursing, discussions with consultants, evaluation of patient's response to treatment, examination of patient, obtaining history from patient or surrogate, ordering and performing treatments and interventions, ordering and review of laboratory studies, ordering and review of radiographic studies, pulse oximetry and re-evaluation of patient's condition.  Eston Hence MSN, AGACNP-BC [place] [facility]/Critical Care Medicine Amion for pager  12/04/2023, 12:07 PM

## 2023-12-04 NOTE — Procedures (Signed)
 Cortrak  Person Inserting Tube:  Edwena Graham D, RD Tube Type:  Cortrak - 43 inches Tube Size:  10 Tube Location:  Left nare Secured by: Bridle Technique Used to Measure Tube Placement:  Marking at nare/corner of mouth Cortrak Secured At:  65 cm   Cortrak Tube Team Note:  Consult received to place a Cortrak feeding tube.   X-ray is required due to surgical alterations to pt's GI tract. RN may begin using tube once confirmed by imaging  If the tube becomes dislodged please keep the tube and contact the Cortrak team at [email or web address] for replacement.  If after hours and replacement cannot be delayed, place a NG tube and confirm placement with an abdominal x-ray.    Edwena Graham, RD, LDN Registered Dietitian II Please reach out via secure chat

## 2023-12-05 DIAGNOSIS — F1020 Alcohol dependence, uncomplicated: Secondary | ICD-10-CM | POA: Diagnosis not present

## 2023-12-05 DIAGNOSIS — E872 Acidosis, unspecified: Secondary | ICD-10-CM | POA: Diagnosis not present

## 2023-12-05 DIAGNOSIS — J690 Pneumonitis due to inhalation of food and vomit: Secondary | ICD-10-CM | POA: Diagnosis not present

## 2023-12-05 DIAGNOSIS — J441 Chronic obstructive pulmonary disease with (acute) exacerbation: Secondary | ICD-10-CM

## 2023-12-05 LAB — CULTURE, RESPIRATORY W GRAM STAIN

## 2023-12-05 LAB — CBC
HCT: 28.6 % — ABNORMAL LOW (ref 36.0–46.0)
Hemoglobin: 8.2 g/dL — ABNORMAL LOW (ref 12.0–15.0)
MCH: 21.7 pg — ABNORMAL LOW (ref 26.0–34.0)
MCHC: 28.7 g/dL — ABNORMAL LOW (ref 30.0–36.0)
MCV: 75.7 fL — ABNORMAL LOW (ref 80.0–100.0)
Platelets: 103 10*3/uL — ABNORMAL LOW (ref 150–400)
RBC: 3.78 MIL/uL — ABNORMAL LOW (ref 3.87–5.11)
RDW: 21.3 % — ABNORMAL HIGH (ref 11.5–15.5)
WBC: 6.3 10*3/uL (ref 4.0–10.5)
nRBC: 0 % (ref 0.0–0.2)

## 2023-12-05 LAB — GLUCOSE, CAPILLARY
Glucose-Capillary: 196 mg/dL — ABNORMAL HIGH (ref 70–99)
Glucose-Capillary: 198 mg/dL — ABNORMAL HIGH (ref 70–99)
Glucose-Capillary: 201 mg/dL — ABNORMAL HIGH (ref 70–99)
Glucose-Capillary: 237 mg/dL — ABNORMAL HIGH (ref 70–99)
Glucose-Capillary: 242 mg/dL — ABNORMAL HIGH (ref 70–99)
Glucose-Capillary: 239 mg/dL — ABNORMAL HIGH (ref 70–99)

## 2023-12-05 LAB — PHOSPHORUS: Phosphorus: 3.3 mg/dL (ref 2.5–4.6)

## 2023-12-05 LAB — BASIC METABOLIC PANEL WITH GFR
Anion gap: 6 (ref 5–15)
BUN: 10 mg/dL (ref 6–20)
CO2: 32 mmol/L (ref 22–32)
Calcium: 8.3 mg/dL — ABNORMAL LOW (ref 8.9–10.3)
Chloride: 102 mmol/L (ref 98–111)
Creatinine, Ser: 0.42 mg/dL — ABNORMAL LOW (ref 0.44–1.00)
GFR, Estimated: >60 mL/min (ref >60)
Glucose, Bld: 182 mg/dL — ABNORMAL HIGH (ref 70–99)
Potassium: 3.3 mmol/L — ABNORMAL LOW (ref 3.5–5.1)
Sodium: 140 mmol/L (ref 135–145)

## 2023-12-05 LAB — MAGNESIUM: Magnesium: 2.1 mg/dL (ref 1.7–2.4)

## 2023-12-05 LAB — LEGIONELLA PNEUMOPHILA SEROGP 1 UR AG: L. pneumophila Serogp 1 Ur Ag: NEGATIVE

## 2023-12-05 LAB — PROCALCITONIN: Procalcitonin: 0.87 ng/mL

## 2023-12-05 MED ORDER — POTASSIUM CHLORIDE 10 MEQ/50ML IV SOLN
10.0000 meq | INTRAVENOUS | Status: AC
  Administered 2023-12-05 (×2): 10 meq via INTRAVENOUS
  Filled 2023-12-05 (×2): qty 50

## 2023-12-05 MED ORDER — DOCUSATE SODIUM 100 MG PO CAPS
100.0000 mg | ORAL_CAPSULE | Freq: Two times a day (BID) | ORAL | Status: DC
  Administered 2023-12-08: 100 mg via ORAL
  Filled 2023-12-05 (×7): qty 1

## 2023-12-05 MED ORDER — ARIPIPRAZOLE 5 MG PO TABS
10.0000 mg | ORAL_TABLET | Freq: Every day | ORAL | Status: DC
  Administered 2023-12-06 – 2023-12-08 (×3): 10 mg via ORAL
  Filled 2023-12-05: qty 1
  Filled 2023-12-05: qty 2
  Filled 2023-12-05: qty 1

## 2023-12-05 MED ORDER — POTASSIUM CHLORIDE 20 MEQ PO PACK
40.0000 meq | PACK | ORAL | Status: AC
  Filled 2023-12-05 (×2): qty 2

## 2023-12-05 MED ORDER — LORAZEPAM 1 MG PO TABS: 1.0000 mg | ORAL_TABLET | ORAL | Status: AC | PRN

## 2023-12-05 MED ORDER — DIVALPROEX SODIUM 125 MG PO CSDR
250.0000 mg | DELAYED_RELEASE_CAPSULE | Freq: Two times a day (BID) | ORAL | Status: DC
  Administered 2023-12-06 – 2023-12-12 (×14): 250 mg via ORAL
  Filled 2023-12-05 (×17): qty 2

## 2023-12-05 MED ORDER — ADULT MULTIVITAMIN W/MINERALS CH
1.0000 | ORAL_TABLET | Freq: Every day | ORAL | Status: DC
  Administered 2023-12-06 – 2023-12-12 (×7): 1 via ORAL
  Filled 2023-12-05 (×8): qty 1

## 2023-12-05 MED ORDER — ACETAMINOPHEN 325 MG PO TABS
650.0000 mg | ORAL_TABLET | Freq: Four times a day (QID) | ORAL | Status: DC | PRN
  Administered 2023-12-06 – 2023-12-13 (×11): 650 mg via ORAL
  Filled 2023-12-05 (×11): qty 2

## 2023-12-05 MED ORDER — ACETAMINOPHEN 650 MG RE SUPP: 650.0000 mg | Freq: Four times a day (QID) | RECTAL | Status: DC | PRN

## 2023-12-05 MED ORDER — LORAZEPAM 2 MG/ML IJ SOLN: 1.0000 mg | INTRAMUSCULAR | Status: AC | PRN

## 2023-12-05 MED ORDER — LEVETIRACETAM 250 MG PO TABS
500.0000 mg | ORAL_TABLET | Freq: Two times a day (BID) | ORAL | Status: DC
  Administered 2023-12-05 – 2023-12-09 (×8): 500 mg via ORAL
  Filled 2023-12-05: qty 2
  Filled 2023-12-05: qty 1
  Filled 2023-12-05: qty 2
  Filled 2023-12-05: qty 1
  Filled 2023-12-05: qty 2
  Filled 2023-12-05 (×2): qty 1
  Filled 2023-12-05: qty 2

## 2023-12-05 MED ORDER — GUAIFENESIN 100 MG/5ML PO LIQD
20.0000 mL | Freq: Three times a day (TID) | ORAL | Status: DC
  Administered 2023-12-06 – 2023-12-12 (×13): 20 mL via ORAL
  Filled 2023-12-05 (×16): qty 20

## 2023-12-05 MED ORDER — SENNOSIDES-DOCUSATE SODIUM 8.6-50 MG PO TABS: 1.0000 | ORAL_TABLET | Freq: Every evening | ORAL | Status: DC | PRN

## 2023-12-05 MED ORDER — FUROSEMIDE 10 MG/ML IJ SOLN
40.0000 mg | Freq: Once | INTRAMUSCULAR | Status: AC
  Filled 2023-12-05: qty 4

## 2023-12-05 MED ORDER — POLYETHYLENE GLYCOL 3350 17 G PO PACK
17.0000 g | PACK | Freq: Every day | ORAL | Status: DC
  Filled 2023-12-05 (×3): qty 1

## 2023-12-05 MED ORDER — PREDNISONE 20 MG PO TABS
20.0000 mg | ORAL_TABLET | Freq: Every day | ORAL | Status: DC
  Administered 2023-12-06 – 2023-12-07 (×2): 20 mg via ORAL
  Filled 2023-12-05 (×2): qty 1

## 2023-12-05 MED ORDER — FOLIC ACID 1 MG PO TABS
1.0000 mg | ORAL_TABLET | Freq: Every day | ORAL | Status: DC
  Administered 2023-12-06 – 2023-12-12 (×7): 1 mg via ORAL
  Filled 2023-12-05 (×8): qty 1

## 2023-12-05 NOTE — Evaluation (Signed)
 Clinical/Bedside Swallow Evaluation Patient Details  Name: [name] MRN: [id number] Date of Birth: 11-26-1965  Today's Date: 12/05/2023 Time: SLP Start Time (ACUTE ONLY): 1725 SLP Stop Time (ACUTE ONLY): 1745 SLP Time Calculation (min) (ACUTE ONLY): 20 min  Past Medical History:  Past Medical History:  Diagnosis Date   Abdominal pain    Accidental drug overdose April 2013   Anxiety    Atrial fibrillation (HCC) 09/29/11   converted spontaneously   Chronic back pain    Chronic knee pain    Chronic nausea    Chronic pain    COPD (chronic obstructive pulmonary disease) (HCC)    Depression    Diabetes mellitus    states her doctor took her off all DM meds in past month   Diabetic neuropathy (HCC)    Dyspnea    with exertion    GERD (gastroesophageal reflux disease)    Headache(784.0)    migraines    HTN (hypertension)    not on meds since in a year    Hyperlipidemia    Hypothyroidism    not on meds in a while    Mental disorder    Bipolar and schizophrenic   Nausea and vomiting 01/02/2023   Requires supplemental oxygen     as needed per patient    Schizophrenia (HCC)    Schizophrenia, acute (HCC) 11/13/2017   Tobacco abuse    Past Surgical History:  Past Surgical History:  Procedure Laterality Date   ABDOMINAL HYSTERECTOMY     BLADDER SUSPENSION  03/04/2011   Procedure: [facility] PROCEDURE;  Surgeon: Jackelyn Marvel MacDiarmid;  Location: [facility];  Service: Urology;  Laterality: N/A;   BOWEL RESECTION N/A 04/18/2022   Procedure: SMALL BOWEL RESECTION;  Surgeon: Sim Dryer, MD;  Location: [facility] OR;  Service: General;  Laterality: N/A;   CYSTOCELE REPAIR  03/04/2011   Procedure: ANTERIOR REPAIR (CYSTOCELE);  Surgeon: Devorah Fonder;  Location: [facility];  Service: Urology;  Laterality: N/A;   CYSTOSCOPY  03/04/2011   Procedure: CYSTOSCOPY;  Surgeon: Jackelyn Marvel MacDiarmid;  Location: [facility];  Service: Urology;  Laterality: N/A;   ESOPHAGOGASTRODUODENOSCOPY (EGD) WITH PROPOFOL  N/A  05/12/2017   Procedure: ESOPHAGOGASTRODUODENOSCOPY (EGD) WITH PROPOFOL ;  Surgeon: Juanita Norlander, MD;  Location: [facility];  Service: General;  Laterality: N/A;   GASTRIC ROUX-EN-Y N/A 03/25/2016   Procedure: LAPAROSCOPIC ROUX-EN-Y GASTRIC BYPASS WITH UPPER ENDOSCOPY;  Surgeon: Ayesha Lente, MD;  Location: [facility];  Service: General;  Laterality: N/A;   KNEE SURGERY     LAPAROSCOPIC ASSISTED VAGINAL HYSTERECTOMY  03/04/2011   Procedure: LAPAROSCOPIC ASSISTED VAGINAL HYSTERECTOMY;  Surgeon: Martine Sleek, MD;  Location: [facility];  Service: Gynecology;  Laterality: N/A;   LAPAROTOMY N/A 04/18/2022   Procedure: EXPLORATORY LAPAROTOMY;  Surgeon: Sim Dryer, MD;  Location: [facility];  Service: General;  Laterality: N/A;   LAPAROTOMY N/A 04/24/2022   Procedure: BRING BACK EXPLORATORY LAPAROTOMY;  Surgeon: Dorena Gander, MD;  Location: [facility] OR;  Service: General;  Laterality: N/A;   TOTAL HIP ARTHROPLASTY Right 08/27/2022   Procedure: TOTAL HIP ARTHROPLASTY;  Surgeon: Murleen Arms, MD;  Location: [facility];  Service: Orthopedics;  Laterality: Right;   HPI:  [name] is a 58 y.o. female with a PMH significant for anxiety and depression, chronic pain, COPD, tobacco use 1 pack/day, COPD, schizophrenia, hypothyroid, hypertension, hyperlipidemia, GERD, diabetes, chronic anemia, PAF not anticoagulated with fall risk, history of PE, cocaine use disorder, alcohol  use disorder, seizure disorder versus alcohol  withdrawal seizure history.  She presented to the hospital on 12/01/2023 with acute hypoxic resp failure with sepsis due to PNA. Intubated 6/11 and extubated 6/13. Pt also with acute metabolic encephalopathy. She was admitted last month (May 2025) with ETOH withdrawal.    Assessment / Plan / Recommendation  Clinical Impression  Patient is not currently presenting with clinical s/s of dysphagia as per this bedside swallow evaluation. She did have frequent cough but it was productive majority of  the time with patient expectorating black colored secretions. SLP assessed her swallow function with PO's of thin liquids (water ), puree solids (applesauce) and solids (graham crackers). She was impulsive with drinking liquids and eating solids at times but no indication of oral or pharyngeal swallow impairment. Patient has a h/o GERD which does put her at risk for post-prandial aspiration. Per patient, she continues to smoke cigarettes (about a pack and a half a day) and drink liquor (40 ounce malt liquor a day) which can exacerbate her GERD symptoms. No further skilled SLP intervention at this time. SLP Visit Diagnosis: Dysphagia, unspecified (R13.10)    Aspiration Risk  Mild aspiration risk    Diet Recommendation Regular;Thin liquid    Medication Administration: Whole meds with liquid Supervision: Patient able to self feed Compensations: Minimize environmental distractions;Slow rate;Small sips/bites Postural Changes: Seated upright at 90 degrees;Remain upright for at least 30 minutes after po intake    Other  Recommendations Oral Care Recommendations: Oral care BID     Assistance Recommended at Discharge    Functional Status Assessment Patient has not had a recent decline in their functional status  Frequency and Duration      N/A      Prognosis   N/A     Swallow Study   General Date of Onset: 12/05/23 HPI: [name] is a 58 y.o. female with a PMH significant for anxiety and depression, chronic pain, COPD, tobacco use 1 pack/day, COPD, schizophrenia, hypothyroid, hypertension, hyperlipidemia, GERD, diabetes, chronic anemia, PAF not anticoagulated with fall risk, history of PE, cocaine use disorder, alcohol  use disorder, seizure disorder versus alcohol  withdrawal seizure history. She presented to the hospital on 12/01/2023 with acute hypoxic resp failure with sepsis due to PNA. Intubated 6/11 and extubated 6/13. Pt also with acute metabolic encephalopathy. She was admitted last  month (May 2025) with ETOH withdrawal. Type of Study: Bedside Swallow Evaluation Previous Swallow Assessment: during admission last month: BSE Diet Prior to this Study: NPO Temperature Spikes Noted: No Respiratory Status: Nasal cannula History of Recent Intubation: Yes Total duration of intubation (days): 3 days Date extubated: 12/04/23 Behavior/Cognition: Alert;Cooperative;Pleasant mood;Impulsive;Requires cueing Oral Cavity Assessment: Within Functional Limits Oral Care Completed by SLP: No Vision: Functional for self-feeding Self-Feeding Abilities: Able to feed self Patient Positioning: Upright in bed Baseline Vocal Quality: Hoarse Volitional Cough: Strong Volitional Swallow: Able to elicit    Oral/Motor/Sensory Function Overall Oral Motor/Sensory Function: Within functional limits   Ice Chips     Thin Liquid Thin Liquid: Within functional limits Presentation: Cup;Self Fed    Nectar Thick     Honey Thick     Puree Puree: Within functional limits Presentation: Self Fed   Solid     Solid: Within functional limits Presentation: Self Fed      Jacqualine Mater, MA, CCC-SLP Speech Therapy

## 2023-12-05 NOTE — Evaluation (Signed)
 Physical Therapy Evaluation Patient Details Name: [name] MRN: [id number] DOB: [birth year] Today's Date: 12/05/2023  History of Present Illness  Pt is 58 year old presented to [facility] on  12/01/23 for SOB, RUQ and Rt ches pain. Pt with acute hypoxic resp failure with sepsis due to PNA. Intubated 6/11 and extubated 6/13. Pt also with acute metabolic encephalopathy. PMH - alcohol  use disorder, polysubstance use, a-fib not on anticoagulation, COPD, seizure disorder, IDA, B12 deficiency, hypothyroidism, schizophrenia.  Clinical Impression  Pt admitted with above diagnosis and presents to PT with functional limitations due to deficits listed below (See PT problem list). Pt needs skilled PT to maximize independence and safety. At baseline it appears pt is able to mobilize independently without assistive device per pt. Currently requiring assist for mobility but expect she will progress rapidly as medical status improves and her cognition improves. Currently feel patient will benefit from continued inpatient follow up therapy, <3 hours/day. However if she progresses rapidly she may not need this from a PT standpoint but may still require supervision for cognitive problems.            If plan is discharge home, recommend the following: Assistance with cooking/housework;Supervision due to cognitive status;Direct supervision/assist for financial management;Direct supervision/assist for medications management   Can travel by private vehicle   Yes    Equipment Recommendations Other (comment) (To be determined)  Recommendations for Other Services       Functional Status Assessment Patient has had a recent decline in their functional status and demonstrates the ability to make significant improvements in function in a reasonable and predictable amount of time.     Precautions / Restrictions Precautions Precautions: Fall;Other (comment) Recall of Precautions/Restrictions:  Impaired Precaution/Restrictions Comments: cortrak Restrictions Weight Bearing Restrictions Per Provider Order: No      Mobility  Bed Mobility Overal bed mobility: Needs Assistance Bed Mobility: Supine to Sit, Sit to Supine     Supine to sit: Contact guard, HOB elevated Sit to supine: Contact guard assist   General bed mobility comments: Assist for safety and lines    Transfers Overall transfer level: Needs assistance Equipment used: None Transfers: Sit to/from Stand, Bed to chair/wheelchair/BSC Sit to Stand: Contact guard assist   Step pivot transfers: Contact guard assist       General transfer comment: Assist for safety and lines    Ambulation/Gait Ambulation/Gait assistance: Contact guard assist Gait Distance (Feet): 3 Feet Assistive device: 1 person hand held assist Gait Pattern/deviations: Step-through pattern, Decreased step length - right, Decreased step length - left, Shuffle Gait velocity: decr Gait velocity interpretation: <1.31 ft/sec, indicative of household ambulator   General Gait Details: Assist for safety and lines  Stairs            Wheelchair Mobility     Tilt Bed    Modified Rankin (Stroke Patients Only)       Balance Overall balance assessment: Needs assistance Sitting-balance support: No upper extremity supported, Feet supported Sitting balance-Leahy Scale: Good     Standing balance support: No upper extremity supported, During functional activity Standing balance-Leahy Scale: Fair                               Pertinent Vitals/Pain Pain Assessment Pain Assessment: No/denies pain    Home Living Family/patient expects to be discharged to:: Private residence Living Arrangements: Children   Type of Home: House Home Access: Level entry  Home Layout: One level        Prior Function Prior Level of Function : Independent/Modified Independent;Patient poor historian/Family not available              Mobility Comments: No assistive device per pt ADLs Comments: Per chart noncompliant with meds     Extremity/Trunk Assessment   Upper Extremity Assessment Upper Extremity Assessment: Defer to OT evaluation    Lower Extremity Assessment Lower Extremity Assessment: Generalized weakness       Communication   Communication Communication: No apparent difficulties    Cognition Arousal: Alert Behavior During Therapy: Flat affect   PT - Cognitive impairments: Orientation, Awareness, Memory, Problem solving, Safety/Judgement   Orientation impairments: Situation                     Following commands: Impaired Following commands impaired: Only follows one step commands consistently     Cueing Cueing Techniques: Verbal cues, Tactile cues     General Comments General comments (skin integrity, edema, etc.): VSS on 4L    Exercises     Assessment/Plan    PT Assessment Patient needs continued PT services  PT Problem List Decreased strength;Decreased activity tolerance;Decreased balance;Decreased mobility       PT Treatment Interventions DME instruction;Gait training;Stair training;Functional mobility training;Therapeutic activities;Therapeutic exercise;Balance training;Patient/family education    PT Goals (Current goals can be found in the Care Plan section)  Acute Rehab PT Goals Patient Stated Goal: Pt did not state PT Goal Formulation: With patient Time For Goal Achievement: 12/19/23 Potential to Achieve Goals: Good    Frequency Min 2X/week     Co-evaluation               AM-PAC PT 6 Clicks Mobility  Outcome Measure Help needed turning from your back to your side while in a flat bed without using bedrails?: None Help needed moving from lying on your back to sitting on the side of a flat bed without using bedrails?: A Little Help needed moving to and from a bed to a chair (including a wheelchair)?: A Little Help needed standing up from a chair  using your arms (e.g., wheelchair or bedside chair)?: A Little Help needed to walk in hospital room?: Total Help needed climbing 3-5 steps with a railing? : Total 6 Click Score: 15    End of Session Equipment Utilized During Treatment: Oxygen  Activity Tolerance: Patient tolerated treatment well Patient left: in bed;with call bell/phone within reach;with bed alarm set;with restraints reapplied Nurse Communication: Mobility status PT Visit Diagnosis: Unsteadiness on feet (R26.81);Other abnormalities of gait and mobility (R26.89);Muscle weakness (generalized) (M62.81)    Time: 0981-1914 PT Time Calculation (min) (ACUTE ONLY): 30 min   Charges:   PT Evaluation $PT Eval Moderate Complexity: 1 Mod PT Treatments $Therapeutic Activity: 8-22 mins PT General Charges $$ ACUTE PT VISIT: 1 Visit         [facility] PT Acute Rehabilitation Services Office [phone]   Pura Browns [facility] 12/05/2023, 3:07 PM

## 2023-12-05 NOTE — Assessment & Plan Note (Signed)
 At high risk for alcohol  withdrawal.  At present no autonomic hyperactivity.  No tremulousness.  No confusion.  - Continue to monitor by CIWA.

## 2023-12-05 NOTE — Assessment & Plan Note (Signed)
 COPD by history.  On examination no respiratory distress.  - Continue current bronchodilators -Short course steroids.

## 2023-12-05 NOTE — Assessment & Plan Note (Signed)
 Presented with acute respiratory failure requiring intubation initially.  Extubated yesterday.  Currently up to chair with no dyspnea.  - Continue to wean oxygen  -Complete 5-day course of antibiotics for suspected aspiration.

## 2023-12-05 NOTE — Progress Notes (Signed)
 NAME:  [name], MRN:  [id number], DOB:  Mar 06, 1966, LOS: 4 ADMISSION DATE:  12/01/2023, CONSULTATION DATE:  12/02/23 REFERRING MD:  TRH, CHIEF COMPLAINT:  SOB   History of Present Illness:  58 year old woman w/ hx of cocaine/alcohol  abuse, anxiety/schizophrenia, COPD, seizure disorder presenting with worsening SOB, RUQ and R lower chest pain over past several days.  Seen in ER for this issue a couple times in past week with normal CXR.  This ER visit, new hypoxemia, lactic acidosis, and RLL infiltrate w/ effusion  Started on CAP coverage but worsening BP and oxygenation so PCCM consulted.  ROS as below  Pertinent  Medical History   Past Medical History:  Diagnosis Date   Abdominal pain    Accidental drug overdose April 2013   Anxiety    Atrial fibrillation (HCC) 09/29/11   converted spontaneously   Chronic back pain    Chronic knee pain    Chronic nausea    Chronic pain    COPD (chronic obstructive pulmonary disease) (HCC)    Depression    Diabetes mellitus    states her doctor took her off all DM meds in past month   Diabetic neuropathy (HCC)    Dyspnea    with exertion    GERD (gastroesophageal reflux disease)    Headache(784.0)    migraines    HTN (hypertension)    not on meds since in a year    Hyperlipidemia    Hypothyroidism    not on meds in a while    Mental disorder    Bipolar and schizophrenic   Nausea and vomiting 01/02/2023   Requires supplemental oxygen     as needed per patient    Schizophrenia (HCC)    Schizophrenia, acute (HCC) 11/13/2017   Tobacco abuse      Significant Hospital Events: Including procedures, antibiotic start and stop dates in addition to other pertinent events   Multiple ER visits for various complaints noted, 20+ this year 6/11 intubated lined pressors bronch/BAL  6/12 weaning pressors, changing sedation 6/13 cortrak, hopeful extubation     Interim History / Subjective:  Remains extubated Remains on precedex    WBC  down to 6.3 PCT 0.9 K 3.3   Objective    Blood pressure 102/60, pulse 82, temperature 98.8 F (37.1 C), temperature source Axillary, resp. rate (!) 25, height 5' 2 (1.575 m), weight 58 kg, last menstrual period 01/08/2011, SpO2 93%.    Vent Mode: PRVC FiO2 (%):  [40 %] 40 % Set Rate:  [26 bmp] 26 bmp Vt Set:  [400 mL] 400 mL PEEP:  [5 cmH20] 5 cmH20 Plateau Pressure:  [16 cmH20] 16 cmH20   Intake/Output Summary (Last 24 hours) at 12/05/2023 1103 Last data filed at 12/05/2023 0600 Gross per 24 hour  Intake 1319.38 ml  Output 3175 ml  Net -1855.62 ml   Filed Weights   12/01/23 2044  Weight: 58 kg    Examination:  General: Chronically and acutely ill adult F NAD  HENT: NCAT pink mm cortrak  Lungs: Upper rhonchi  Cardiovascular: rrr s1s2  Abdomen:  soft ndnt  Extremities: no acute joint deformity  Neuro: slightly sedate. Following commands. Generalized weakness  Skin: c/d/w   Assessment and Plan   Acute metabolic encephalopathy, improving Polysub and etoh abuse Schizophrenia, anxiety Hx sz -non adherent w all home meds per daughter  P -started CIWA 6/13 following extubation. -wean precedex   -cont mood stabilizers, AEDs (cannot give zonisamide  per tube. Restart when  able. If prolonged NPO period can reach out to neuro and see if we need vimpat  instead, but knowing she doesn't take her meds at home think we are ok for now), antipsychotics  Acute hypoxic resp failure RLL CAP vs aspiration PNA Sepsis 2/2 above R pleural effusion -- not amenable to intervention  COPD w/o exacerbation  P -5d vanc zosyn , completed 3d azithro. Follow micro data -weaned pred 40 to 20 6/14  -triple therapy  -repeat lasix  after K replaced 6/14   Hypokalemia P -replace, cont to follow   GOC Medication non-adherence -high fq ER/hospital utilization. Does not take her home meds. Wondering if she she might need facility placement? TOC consult placed   Best Practice (right click and  Reselect all SmartList Selections daily)   Diet/type: NPO -- EN per RDN  DVT prophylaxis LMWH Pressure ulcer(s): N/A GI prophylaxis: PPI Lines: Central line -- tried to get PIVs 6/13, no viable targets  Foley:  N/A Code Status:  full code Last date of multidisciplinary goals of care discussion [-- ]  Labs   CBC: Recent Labs  Lab 11/30/23 0605 11/30/23 0611 12/01/23 0535 12/01/23 2104 12/02/23 0105 12/02/23 0535 12/02/23 0646 12/02/23 0912 12/02/23 1711 12/03/23 0436 12/04/23 0403 12/05/23 0500  WBC 4.8  --  5.8 15.5* 2.5*  --  6.9  --   --  22.8* 17.6* 6.3  NEUTROABS 3.4  --  4.1 12.9*  --   --  4.7  --   --   --   --   --   HGB 9.1*   < > 8.4* 9.8* 8.5*   < > 8.5* 9.2* 8.8* 7.5* 7.8* 8.2*  HCT 31.8*   < > 29.0* 34.3* 30.0*   < > 30.4* 27.0* 26.0* 27.4* 28.5* 28.6*  MCV 75.7*  --  73.4* 76.1* 74.8*  --  77.0*  --   --  77.2* 77.2* 75.7*  PLT 250  --  216 176 114*  --  151  --   --  134* 111* 103*   < > = values in this interval not displayed.    Basic Metabolic Panel: Recent Labs  Lab 11/30/23 0605 11/30/23 0611 12/02/23 0105 12/02/23 0535 12/02/23 0646 12/02/23 0912 12/02/23 1711 12/03/23 0436 12/04/23 0403 12/04/23 2000 12/05/23 0500  NA 138   < > 135   < > 136 139 142 143 142  --  140  K 4.0   < > 4.4   < > 4.2 3.7 3.7 3.5 3.2*  --  3.3*  CL 103   < > 105  --  104  --   --  105 105  --  102  CO2 20*   < > 19*  --  24  --   --  31 29  --  32  GLUCOSE 227*   < > 148*  --  199*  --   --  170* 166*  --  182*  BUN <5*   < > 7  --  9  --   --  6 8  --  10  CREATININE 0.40*   < > 0.63  --  0.67  --   --  0.43* 0.52  --  0.42*  CALCIUM  8.7*   < > 7.9*  --  7.8*  --   --  8.0* 8.5*  --  8.3*  MG 2.0  --   --   --   --   --   --  2.1 2.3  --  2.1  PHOS  --   --   --   --   --   --   --  2.6 1.5* 3.5 3.3   < > = values in this interval not displayed.   GFR: Estimated Creatinine Clearance: 59.9 mL/min (A) (by C-G formula based on SCr of 0.42 mg/dL (L)). Recent  Labs  Lab 12/02/23 0646 12/02/23 1058 12/02/23 1141 12/02/23 1143 12/02/23 1434 12/02/23 1635 12/03/23 0436 12/04/23 0403 12/05/23 0500  PROCALCITON  --   --   --  4.45  --   --   --   --  0.87  WBC 6.9  --   --   --   --   --  22.8* 17.6* 6.3  LATICACIDVEN  --  3.9* 3.7*  --  2.6* 2.3*  --   --   --     Liver Function Tests: Recent Labs  Lab 11/30/23 0605 12/01/23 2115 12/02/23 0646  AST 21 35 20  ALT 17 18 14   ALKPHOS 81 76 52  BILITOT 0.5 0.7 1.0  PROT 6.8 7.0 5.4*  ALBUMIN  3.4* 3.5 2.4*   No results for input(s): LIPASE, AMYLASE in the last 168 hours. No results for input(s): AMMONIA in the last 168 hours.  ABG    Component Value Date/Time   PHART 7.338 (L) 12/02/2023 1711   PCO2ART 55.3 (H) 12/02/2023 1711   PO2ART 137 (H) 12/02/2023 1711   HCO3 29.5 (H) 12/02/2023 1711   TCO2 31 12/02/2023 1711   ACIDBASEDEF 2.0 12/02/2023 0535   O2SAT 99 12/02/2023 1711     Coagulation Profile: Recent Labs  Lab 12/01/23 2130  INR 0.9    Cardiac Enzymes: No results for input(s): CKTOTAL, CKMB, CKMBINDEX, TROPONINI in the last 168 hours.  HbA1C: Hgb A1c MFr Bld  Date/Time Value Ref Range Status  11/05/2023 04:30 AM 6.5 (H) 4.8 - 5.6 % Final    Comment:    (NOTE) Pre diabetes:          5.7%-6.4%  Diabetes:              >6.4%  Glycemic control for   <7.0% adults with diabetes   01/11/2023 09:00 AM 7.3 (H) 4.8 - 5.6 % Final    Comment:    (NOTE) Pre diabetes:          5.7%-6.4%  Diabetes:              >6.4%  Glycemic control for   <7.0% adults with diabetes     CBG: Recent Labs  Lab 12/04/23 1559 12/04/23 1924 12/04/23 2329 12/05/23 0332 12/05/23 0757  GLUCAP 202* 206* 175* 196* 198*      CRITICAL CARE Performed by: Delories Fetter   Total critical care time: 39 minutes  Critical care time was exclusive of separately billable procedures and treating other patients.  Critical care was necessary to treat or prevent  imminent or life-threatening deterioration.  Critical care was time spent personally by me on the following activities: development of treatment plan with patient and/or surrogate as well as nursing, discussions with consultants, evaluation of patient's response to treatment, examination of patient, obtaining history from patient or surrogate, ordering and performing treatments and interventions, ordering and review of laboratory studies, ordering and review of radiographic studies, pulse oximetry and re-evaluation of patient's condition.  Eston Hence MSN, AGACNP-BC [facility]/Critical [facility] Medicine Amion for pager  12/05/2023, 11:03 AM

## 2023-12-05 NOTE — Progress Notes (Signed)
 To bedside during patient bath when a string was identified coming from vagina which appears to be a tampon string.  Chart reviewed -- pt is s/p hysterectomy   Pt ultimately shares that she uses tampons sometimes to minimize her normal vaginal discharge and thinks this may have been in for 2-3 days (which I think likely means 2-3 days prior to admission)  Pt removed tampon without difficulty. Did not appreciate purulent discharge.    Reviewed abx coverage w PharmD -- will cont current coverage and duration (extended duration would be recommended if pt had concomitant bacteremia)    When pt is more oriented, will need edu on tampon utilization    Otherwise, is doing well off of precedex . May be able to txf out of ICU later today.    Eston Hence MSN, AGACNP-BC [facility] Pulmonary/Critical Care Medicine 12/05/2023, 2:44 PM

## 2023-12-05 NOTE — Assessment & Plan Note (Signed)
-   Continue home anticonvulsants.

## 2023-12-05 NOTE — Progress Notes (Signed)
 eLink Physician-Brief Progress Note Patient Name: [name] DOB: 04/09/1966 MRN: [id number]   Date of Service  12/05/2023  HPI/Events of Note  Patient needs restraints renewed for patient safety.  eICU Interventions  Order entered.        Shiro Ellerman U [name] 12/05/2023, 12:19 AM

## 2023-12-05 NOTE — Progress Notes (Addendum)
 Pharmacy Electrolyte Replacement  Recent Labs:  Recent Labs    12/05/23 0500  K 3.3*  MG 2.1  PHOS 3.3  CREATININE 0.42*    Low Critical Values (K </= 2.5, Phos </= 1, Mg </= 1) Present: None  MD Contacted: Eston Hence, NP  Plan: KCl per tube x2 doses + 2 runs KCl 10 mEq IV via CVC in discussion with NP as giving more Lasix  today  Lenard Quam, PharmD, BCPS, BCCCP Please refer to [facility] for [facility] numbers 12/05/2023, 10:54 AM

## 2023-12-06 DIAGNOSIS — F1020 Alcohol dependence, uncomplicated: Secondary | ICD-10-CM

## 2023-12-06 DIAGNOSIS — J189 Pneumonia, unspecified organism: Secondary | ICD-10-CM | POA: Diagnosis not present

## 2023-12-06 DIAGNOSIS — F3342 Major depressive disorder, recurrent, in full remission: Secondary | ICD-10-CM | POA: Diagnosis not present

## 2023-12-06 DIAGNOSIS — F325 Major depressive disorder, single episode, in full remission: Secondary | ICD-10-CM | POA: Diagnosis present

## 2023-12-06 DIAGNOSIS — E872 Acidosis, unspecified: Secondary | ICD-10-CM | POA: Diagnosis not present

## 2023-12-06 DIAGNOSIS — A419 Sepsis, unspecified organism: Secondary | ICD-10-CM | POA: Diagnosis not present

## 2023-12-06 DIAGNOSIS — R6520 Severe sepsis without septic shock: Secondary | ICD-10-CM | POA: Diagnosis not present

## 2023-12-06 LAB — GLUCOSE, CAPILLARY
Glucose-Capillary: 117 mg/dL — ABNORMAL HIGH (ref 70–99)
Glucose-Capillary: 149 mg/dL — ABNORMAL HIGH (ref 70–99)
Glucose-Capillary: 152 mg/dL — ABNORMAL HIGH (ref 70–99)
Glucose-Capillary: 192 mg/dL — ABNORMAL HIGH (ref 70–99)
Glucose-Capillary: 219 mg/dL — ABNORMAL HIGH (ref 70–99)
Glucose-Capillary: 240 mg/dL — ABNORMAL HIGH (ref 70–99)

## 2023-12-06 LAB — CBC
HCT: 26.9 % — ABNORMAL LOW (ref 36.0–46.0)
Hemoglobin: 7.7 g/dL — ABNORMAL LOW (ref 12.0–15.0)
MCH: 21.1 pg — ABNORMAL LOW (ref 26.0–34.0)
MCHC: 28.6 g/dL — ABNORMAL LOW (ref 30.0–36.0)
MCV: 73.7 fL — ABNORMAL LOW (ref 80.0–100.0)
Platelets: 96 10*3/uL — ABNORMAL LOW (ref 150–400)
RBC: 3.65 MIL/uL — ABNORMAL LOW (ref 3.87–5.11)
RDW: 21.3 % — ABNORMAL HIGH (ref 11.5–15.5)
WBC: 6.2 10*3/uL (ref 4.0–10.5)
nRBC: 0 % (ref 0.0–0.2)

## 2023-12-06 LAB — CULTURE, BLOOD (ROUTINE X 2)
Culture: NO GROWTH
Culture: NO GROWTH

## 2023-12-06 LAB — BASIC METABOLIC PANEL WITH GFR
Anion gap: 9 (ref 5–15)
BUN: 9 mg/dL (ref 6–20)
CO2: 28 mmol/L (ref 22–32)
Calcium: 8.7 mg/dL — ABNORMAL LOW (ref 8.9–10.3)
Chloride: 99 mmol/L (ref 98–111)
Creatinine, Ser: 0.38 mg/dL — ABNORMAL LOW (ref 0.44–1.00)
GFR, Estimated: >60 mL/min (ref >60)
Glucose, Bld: 166 mg/dL — ABNORMAL HIGH (ref 70–99)
Potassium: 3.5 mmol/L (ref 3.5–5.1)
Sodium: 136 mmol/L (ref 135–145)

## 2023-12-06 LAB — PHOSPHORUS: Phosphorus: 2.9 mg/dL (ref 2.5–4.6)

## 2023-12-06 LAB — MAGNESIUM: Magnesium: 1.9 mg/dL (ref 1.7–2.4)

## 2023-12-06 MED ORDER — GERHARDT'S BUTT CREAM
TOPICAL_CREAM | CUTANEOUS | Status: DC | PRN
  Filled 2023-12-06: qty 60

## 2023-12-06 MED ORDER — MIRTAZAPINE 7.5 MG PO TABS
7.5000 mg | ORAL_TABLET | Freq: Every day | ORAL | Status: AC
Start: 2023-12-06 — End: ?
  Administered 2023-12-06 – 2023-12-08 (×3): 7.5 mg via ORAL
  Filled 2023-12-06 (×3): qty 1

## 2023-12-06 MED ORDER — LOPERAMIDE HCL 2 MG PO CAPS
2.0000 mg | ORAL_CAPSULE | ORAL | Status: DC | PRN
  Administered 2023-12-06 – 2023-12-11 (×5): 2 mg via ORAL
  Filled 2023-12-06 (×5): qty 1

## 2023-12-06 NOTE — Progress Notes (Signed)
  Progress Note   Patient: [name] NWG:[id number] DOB: 01-Dec-1965 DOA: 12/01/2023     5 DOS: the patient was seen and examined on 12/06/2023   Brief hospital course: [name] is a 58 y.o. female with medical history significant for COPD, seizure disorder, iron  deficiency anemia, depression/anxiety/schizophrenia, polysubstance use (EtOH, cocaine, tobacco), medical nonadherence and frequent ED visits who is admitted with severe sepsis due to right lower lobe pneumonia.  Assessment and Plan: RLL PNA with sepsis present on admission -acutely worsened on presentation, requiring intubation and transfer to ICU on 6/11. Later extubated 6/13 -Plan for 5 days of vanc/zosyn  -Seems to be improving -Leukocytosis normalized -Therapy recs for SNF noted  ETOH abuse -continued on CIWA as needed -minimal CIWA score this AM  COPD -cont with current bronchodilators -wean steroids as tolerated  Hx seizures -stable thus far -cont AED's  FE deficiency anemia -Hemodynamically stable -recheck cbc in AM  Depression/Anxiety/Schizophrenia -seen by Psychiatry.  -recs for depakote  500mg  BID with ativan  PRN, cont abilify  10mg  every day, remeron qhs     Subjective: Without complaints this AM  Physical Exam: Vitals:   12/06/23 0428 12/06/23 0746 12/06/23 1049 12/06/23 1514  BP:  114/70 139/74 128/76  Pulse:  (!) 102 (!) 117 (!) 116  Resp:  (!) 26 20 17   Temp:  97.9 F (36.6 C) 97.7 F (36.5 C) 98.1 F (36.7 C)  TempSrc:  Oral Oral Oral  SpO2:  94% 95% 96%  Weight: 54.1 kg     Height:       General exam: Awake, laying in bed, in nad Respiratory system: Normal respiratory effort, no wheezing Cardiovascular system: regular rate, s1, s2 Gastrointestinal system: Soft, nondistended, positive BS Central nervous system: CN2-12 grossly intact, strength intact Extremities: Perfused, no clubbing Skin: Normal skin turgor, no notable skin lesions seen Psychiatry: Mood normal // no visual  hallucinations   Data Reviewed:  Labs reviewed: Na 136, K 3.6, Cr 0.38, WBC 6.2, Hgb 7.7   Family Communication: Pt in room, family not at bedside  Disposition: Status is: Inpatient Remains inpatient appropriate because: Severity of illness  Planned Discharge Destination: Skilled nursing facility    Author: Cherylle Corwin, MD 12/06/2023 6:13 PM  For on call review [email or web address].

## 2023-12-06 NOTE — Progress Notes (Signed)
 Mobility Specialist Progress Note;   12/06/23 1111  Mobility  Activity Ambulated with assistance in hallway;Transferred from bed to chair  Level of Assistance Minimal assist, patient does 75% or more  Assistive Device Front wheel walker  Distance Ambulated (ft) 80 ft  Activity Response Tolerated well  Mobility Referral Yes  Mobility visit 1 Mobility  Mobility Specialist Start Time (ACUTE ONLY) 1111  Mobility Specialist Stop Time (ACUTE ONLY) 1126  Mobility Specialist Time Calculation (min) (ACUTE ONLY) 15 min   Pt received sitting on EoB, agreeable to mobility. On 4LO2 upon arrival. Required MinA during ambulation for safety and VC for awareness. Ambulated on 6LO2 for SPO2 to maintain 90%>. C/o R breast hurting, otherwise asx. Pt requested to sit in chair at [facility]. Pt left in chair with all needs met, alarm on.   Janit Meline Mobility Specialist Please contact via SecureChat or [employer] [phone]

## 2023-12-06 NOTE — Consult Note (Signed)
 [name] Psychiatric Consult Initial Visit  Patient Name: [name]  MRN: [id number]  DOB: [birth year]  Consult Order details:  Orders (From admission, onward)     Start     Ordered   06/12/23 0826  CONSULT TO CALL ACT TEAM       Ordering Provider: Jerilynn Montenegro, MD  Provider:  (Not yet assigned)  Question Answer Comment  Place call to: ACT   Reason for Consult Admit      06/12/23 0825   06/11/23 1913  CONSULT TO CALL ACT TEAM       Ordering Provider: Carin Charleston, MD  Provider:  (Not yet assigned)  Question:  Reason for Consult?  Answer:  IVC, agitation   06/11/23 1912             Mode of Visit: In person    Psychiatry Consult Evaluation  Service Date: December 06, 2023 LOS:  LOS: 5 days  Chief Complaint medication management  Primary Psychiatric Diagnoses  Polysubstance abuse 2.   MDD, remission  Assessment  [name] is a 58 y.o. female admitted: Presented to the EDfor 12/01/2023  8:40 PM with medical history significant for COPD, seizure disorder, iron  deficiency anemia, depression/anxiety, polysubstance use (EtOH, cocaine, tobacco), medical non-adherence and frequent ED visits who presented to the ED for evaluation of shortness of breath with right lower chest discomfort; diagnosed with pneumonia.  Patient is AA female, 58 years old who came in to the ER with shortness of breath and alcohol  and cocaine abuse.   On assessment, she denies withdrawal symptoms and cravings, minimizes her substance use with drinking only one 40-ounce Ice House (high alcohol  content) daily and denied other substance abuse despite being positive for cocaine.  She did stated she smokes a ppd of cigarettes.  She denied depression, anxiety, hallucinations, suicidal/homicidal ideations, and paranoia.  Her sleep was not that good, appetite is fine.  She reported being compliant with her medications prior to admission and need to get back on them in the hospital.    Prior to the  assessment, she was standing up beside her bed unsteadily and did sit when requested.  She stated she needed to go to the BR which was communicated to the nurse that was assisting her to the bedside commode.  The client reported she lives alone with supportive daughters nearby that get food and items she needs.  She did say she walks to the store to get alcohol .  Calm and cooperative on assessment with no abnormal behaviors.  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 May discharge with [facility] resources to [facility] once medically stable.    ## Safety and Observation Level:  - Based on my clinical evaluation, I estimate the patient to be at low risk of self harm  in the current setting. - At this time, we recommend  routine.   CSSR Risk Category:C-SSRS RISK CATEGORY: No Risk  Suicide Risk Assessment: Patient has following modifiable risk factors for suicide: untreated depression, recklessness, and medication noncompliance, which we are addressing by encouraging patient to join [employer], Seek outpatient alcohol  treatment.  Patient has following non-modifiable or demographic risk factors for suicide: separation or divorce, history of self harm behavior, and psychiatric hospitalization Patient has the  following protective factors against suicide: Supportive family and Supportive friends  Thank you for this consult request. Recommendations have been communicated to the primary team.  We will continue to follow from a distance.   Roslynn Coombes, NP       History of Present Illness  Patient is AA female, 58 years old who came in to the ER with shortness of breath and alcohol  and cocaine abuse.   On assessment, she denies withdrawal symptoms and cravings, minimizes her substance use with drinking only one 40-ounce Ice House (high alcohol  content) daily and denied other substance abuse despite being positive for cocaine.  She did stated she smokes a ppd of cigarettes.  She denied depression, anxiety, hallucinations, suicidal/homicidal ideations, and paranoia.  Her sleep was not that good, appetite is fine.  She reported being compliant with her medications prior to admission and need to get back on them in the hospital.    Prior to the assessment, she was standing up beside her bed unsteadily and did sit when requested.  She stated she needed to go to the BR which was communicated to the nurse that was assisting her to the bedside commode.  The client reported she lives alone with supportive daughters nearby that get food and items she needs.  She did say she walks to the store to get alcohol .  Calm and cooperative on assessment with no abnormal behaviors.  Collateral information:  Na  Review of Systems  Constitutional: Negative.   HENT: Negative.    Eyes: Negative.   Respiratory:  Positive for cough and shortness of breath.   Cardiovascular: Negative.   Gastrointestinal: Negative.   Genitourinary: Negative.   Musculoskeletal: Negative.   Skin: Negative.   Neurological: Negative.   Endo/Heme/Allergies: Negative.   Psychiatric/Behavioral:  Positive for substance abuse.        Alcoholism     Psychiatric and Social History  Psychiatric History:  Information collected from patient and  chart  Prev Dx/Sx: MDD, polysubstance abuse Current Psych Provider: None Home Meds (current): None Previous Med Trials: Prozac , does not take anymore Therapy: Past history  Prior Psych Hospitalization: Yes Prior Self Harm: Yes Prior Violence: Yes  Family Psych History: Patient does not know Family Hx suicide: Patient denies  Social History:  Developmental Hx: Patient appears to be appropriate developmental age but Physically patient appears older than stated age. Educational Hx: High school Occupational Hx: Unemployed Legal Hx: None per patient Living Situation: Lives alone, supportive daughter in the area Spiritual Hx: [employer] Access to weapons/lethal means: None  Substance History Alcohol : Yes Type of alcohol  Beer Last Drink prior to admission Number of drinks per day 1 40 ounce Ice House (high alcohol  content) daily History of alcohol  withdrawal seizures patient denies History of DT's patient denies Tobacco: Yes Illicit drugs: Yes cocaine Prescription drug abuse: Patient denies Rehab hx: Patient denies  Exam Findings  Physical Exam: Completed by the MD, reviewed by this provider. Vital Signs:  Temp:  [97.9 F (36.6 C)-98.8 F (37.1 C)] 97.9 F (  36.6 C) (06/15 0746) Pulse Rate:  [78-122] 102 (06/15 0746) Resp:  [20-30] 26 (06/15 0746) BP: (102-128)/(60-82) 114/70 (06/15 0746) SpO2:  [82 %-100 %] 94 % (06/15 0746) Weight:  [54.1 kg] 54.1 kg (06/15 0428) Blood pressure 114/70, pulse (!) 102, temperature 97.9 F (36.6 C), temperature source Oral, resp. rate (!) 26, height 5' 2 (1.575 m), weight 54.1 kg, last menstrual period 01/08/2011, SpO2 94%. Body mass index is 21.8 kg/m.  Physical Exam Vitals (appears older than stated age.) and nursing note reviewed.  Constitutional:      Appearance: Normal appearance.  HENT:     Nose: Nose normal.  Pulmonary:     Effort: Pulmonary effort is normal.   Neurological:     Mental Status: She is alert and oriented to  person, place, and time.   Psychiatric:        Attention and Perception: Attention normal.        Speech: Speech normal.        Behavior: Behavior is cooperative.        Thought Content: Thought content normal.        Cognition and Memory: Cognition and memory normal.     Mental Status Exam: General Appearance: Casual and Neat  Orientation:  Full (Time, Place, and Person)  Memory:  Immediate;   Good Remote;   Good  Concentration:  Concentration: Good and Attention Span: Good  Recall:  Good  Attention  Good  Eye Contact:  Good  Speech:  Clear and Coherent  Language:  Good  Volume:  Normal  Mood: euthymic  Affect:  Appropriate  Thought Process:  Coherent  Thought Content:  WDL  Suicidal Thoughts:  No  Homicidal Thoughts:  No  Judgement:  Good  Insight:  Good  Psychomotor Activity:  Normal  Akathisia:  No  Fund of Knowledge:  Fair      Assets:  Manufacturing systems engineer Desire for Improvement Housing Social Support  Cognition:  WNL  ADL's:  Intact  AIMS (if indicated):        Other History   These have been pulled in through the EMR, reviewed, and updated if appropriate.  Family History:  The patient's family history includes COPD in an other family member; Diabetes in her mother; Heart attack in her father and sister; Heart disease in her mother; Hypertension in her mother.  Medical History: Past Medical History:  Diagnosis Date   Abdominal pain    Accidental drug overdose April 2013   Anxiety    Atrial fibrillation (HCC) 09/29/11   converted spontaneously   Chronic back pain    Chronic knee pain    Chronic nausea    Chronic pain    COPD (chronic obstructive pulmonary disease) (HCC)    Depression    Diabetes mellitus    states her doctor took her off all DM meds in past month   Diabetic neuropathy (HCC)    Dyspnea    with exertion    GERD (gastroesophageal reflux disease)    Headache(784.0)    migraines    HTN (hypertension)    not on meds since in a  year    Hyperlipidemia    Hypothyroidism    not on meds in a while    Mental disorder    Bipolar and schizophrenic   Nausea and vomiting 01/02/2023   Requires supplemental oxygen     as needed per patient    Schizophrenia (HCC)    Schizophrenia, acute (HCC) 11/13/2017   Tobacco  abuse     Surgical History: Past Surgical History:  Procedure Laterality Date   ABDOMINAL HYSTERECTOMY     BLADDER SUSPENSION  03/04/2011   Procedure: [facility] PROCEDURE;  Surgeon: Jackelyn Marvel MacDiarmid;  Location: [facility] ORS;  Service: Urology;  Laterality: N/A;   BOWEL RESECTION N/A 04/18/2022   Procedure: SMALL BOWEL RESECTION;  Surgeon: Sim Dryer, MD;  Location: [facility] OR;  Service: General;  Laterality: N/A;   CYSTOCELE REPAIR  03/04/2011   Procedure: ANTERIOR REPAIR (CYSTOCELE);  Surgeon: Devorah Fonder;  Location: [facility];  Service: Urology;  Laterality: N/A;   CYSTOSCOPY  03/04/2011   Procedure: CYSTOSCOPY;  Surgeon: Jackelyn Marvel MacDiarmid;  Location: [facility];  Service: Urology;  Laterality: N/A;   ESOPHAGOGASTRODUODENOSCOPY (EGD) WITH PROPOFOL  N/A 05/12/2017   Procedure: ESOPHAGOGASTRODUODENOSCOPY (EGD) WITH PROPOFOL ;  Surgeon: Juanita Norlander, MD;  Location: [facility];  Service: General;  Laterality: N/A;   GASTRIC ROUX-EN-Y N/A 03/25/2016   Procedure: LAPAROSCOPIC ROUX-EN-Y GASTRIC BYPASS WITH UPPER ENDOSCOPY;  Surgeon: Ayesha Lente, MD;  Location: [facility];  Service: General;  Laterality: N/A;   KNEE SURGERY     LAPAROSCOPIC ASSISTED VAGINAL HYSTERECTOMY  03/04/2011   Procedure: LAPAROSCOPIC ASSISTED VAGINAL HYSTERECTOMY;  Surgeon: Martine Sleek, MD;  Location: [facility];  Service: Gynecology;  Laterality: N/A;   LAPAROTOMY N/A 04/18/2022   Procedure: EXPLORATORY LAPAROTOMY;  Surgeon: Sim Dryer, MD;  Location: [facility] OR;  Service: General;  Laterality: N/A;   LAPAROTOMY N/A 04/24/2022   Procedure: BRING BACK EXPLORATORY LAPAROTOMY;  Surgeon: Dorena Gander, MD;  Location: [facility] OR;  Service: General;   Laterality: N/A;   TOTAL HIP ARTHROPLASTY Right 08/27/2022   Procedure: TOTAL HIP ARTHROPLASTY;  Surgeon: Murleen Arms, MD;  Location: [facility] OR;  Service: Orthopedics;  Laterality: Right;     Medications:   Current Facility-Administered Medications:    acetaminophen  (TYLENOL ) tablet 650 mg, 650 mg, Oral, Q6H PRN **OR** acetaminophen  (TYLENOL ) suppository 650 mg, 650 mg, Rectal, Q6H PRN, Agarwala, Ravi, MD   arformoterol  (BROVANA ) nebulizer solution 15 mcg, 15 mcg, Nebulization, BID, Patel, Vishal R, MD, 15 mcg at 12/06/23 1610   ARIPiprazole  (ABILIFY ) tablet 10 mg, 10 mg, Oral, Daily, Agarwala, Ravi, MD, 10 mg at 12/06/23 0909   budesonide  (PULMICORT ) nebulizer solution 0.25 mg, 0.25 mg, Nebulization, BID, Patel, Vishal R, MD, 0.25 mg at 12/06/23 9604   Chlorhexidine  Gluconate Cloth 2 % PADS 6 each, 6 each, Topical, Daily, Josiah Nigh, MD, 6 each at 12/06/23 5409   divalproex  (DEPAKOTE  SPRINKLE) capsule 250 mg, 250 mg, Oral, Q12H, Agarwala, Ravi, MD, 250 mg at 12/06/23 0909   docusate sodium  (COLACE) capsule 100 mg, 100 mg, Oral, BID, Agarwala, Ravi, MD   enoxaparin  (LOVENOX ) injection 40 mg, 40 mg, Subcutaneous, Daily, Lydia Sams, Vishal R, MD, 40 mg at 12/06/23 0908   feeding supplement (OSMOLITE 1.2 CAL) liquid 1,000 mL, 1,000 mL, Per Tube, Continuous, Josiah Nigh, MD, Stopped at 12/06/23 0901   feeding supplement (PROSource TF20) liquid 60 mL, 60 mL, Per Tube, Daily, Josiah Nigh, MD, 60 mL at 12/05/23 8119   folic acid  (FOLVITE ) tablet 1 mg, 1 mg, Oral, Daily, Agarwala, Ravi, MD, 1 mg at 12/06/23 0909   Gerhardt's butt cream, , Topical, PRN, Oral [name], MD   guaiFENesin  (ROBITUSSIN) 100 MG/5ML liquid 20 mL, 20 mL, Oral, Q8H, Agarwala, Ravi, MD, 20 mL at 12/06/23 0551   insulin  aspart (novoLOG ) injection 0-15 Units, 0-15 Units, Subcutaneous, Q4H, Josiah Nigh, MD, 2 Units at 12/06/23 [phone]  ipratropium-albuterol  (DUONEB) 0.5-2.5 (3) MG/3ML nebulizer solution 3 mL, 3 mL,  Nebulization, Q6H PRN, Edith Gores R, MD, 3 mL at 12/02/23 0042   levETIRAcetam  (KEPPRA ) tablet 500 mg, 500 mg, Oral, BID, Agarwala, Ravi, MD, 500 mg at 12/06/23 0910   LORazepam  (ATIVAN ) tablet 1-4 mg, 1-4 mg, Oral, Q1H PRN **OR** LORazepam  (ATIVAN ) injection 1-4 mg, 1-4 mg, Intravenous, Q1H PRN, Agarwala, Ravi, MD   multivitamin with minerals tablet 1 tablet, 1 tablet, Oral, Daily, Agarwala, Ravi, MD, 1 tablet at 12/06/23 0909   mupirocin  ointment (BACTROBAN ) 2 % 1 Application, 1 Application, Nasal, BID, Joesph Mussel, DO, 1 Application at 12/06/23 0915   nicotine  (NICODERM CQ  - dosed in mg/24 hours) patch 21 mg, 21 mg, Transdermal, Daily, Patel, Vishal R, MD, 21 mg at 12/06/23 0909   ondansetron  (ZOFRAN ) tablet 4 mg, 4 mg, Oral, Q6H PRN **OR** ondansetron  (ZOFRAN ) injection 4 mg, 4 mg, Intravenous, Q6H PRN, Patel, Vishal R, MD   Oral care mouth rinse, 15 mL, Mouth Rinse, PRN, Josiah Nigh, MD   Oral care mouth rinse, 15 mL, Mouth Rinse, 4 times per day, Jaquita Merl P, DO, 15 mL at 12/06/23 0846   Oral care mouth rinse, 15 mL, Mouth Rinse, PRN, Jaquita Merl P, DO   piperacillin -tazobactam (ZOSYN ) IVPB 3.375 g, 3.375 g, Intravenous, Q8H, Bowser, Grace E, NP, Last Rate: 12.5 mL/hr at 12/06/23 0551, 3.375 g at 12/06/23 0551   polyethylene glycol (MIRALAX  / GLYCOLAX ) packet 17 g, 17 g, Oral, Daily, Agarwala, Ravi, MD   predniSONE  (DELTASONE ) tablet 20 mg, 20 mg, Oral, Q breakfast, Agarwala, Ravi, MD, 20 mg at 12/06/23 0845   revefenacin  (YUPELRI ) nebulizer solution 175 mcg, 175 mcg, Nebulization, Daily, Josiah Nigh, MD, 175 mcg at 12/06/23 0838   senna-docusate (Senokot-S) tablet 1 tablet, 1 tablet, Oral, QHS PRN, Agarwala, Ravi, MD   sodium chloride  flush (NS) 0.9 % injection 10-40 mL, 10-40 mL, Intracatheter, Q12H, Josiah Nigh, MD, 10 mL at 12/06/23 0915   sodium chloride  flush (NS) 0.9 % injection 10-40 mL, 10-40 mL, Intracatheter, PRN, Josiah Nigh, MD   sodium chloride  flush  (NS) 0.9 % injection 3 mL, 3 mL, Intravenous, Q12H, Patel, Vishal R, MD, 3 mL at 12/06/23 0915   thiamine  (VITAMIN B1) tablet 100 mg, 100 mg, Per Tube, Daily, 100 mg at 12/06/23 0909 **OR** thiamine  (VITAMIN B1) injection 100 mg, 100 mg, Intravenous, Daily, Josiah Nigh, MD   vancomycin  (VANCOREADY) IVPB 500 mg/100 mL, 500 mg, Intravenous, Q12H, Bowser, Grace E, NP, Last Rate: 100 mL/hr at 12/06/23 0941, 500 mg at 12/06/23 0941   zonisamide  (ZONEGRAN ) capsule 200 mg, 200 mg, Oral, Daily, Josiah Nigh, MD, 200 mg at 12/06/23 9147  Allergies: Allergies  Allergen Reactions   Iron  Dextran Shortness Of Breath and Anxiety   Aspirin  Nausea And Vomiting and Other (See Comments)    Ok to take tylenol  or ibuprofen     Penicillins Other (See Comments)    Unknown reaction from childhood: family would like this to remain as an allergy.  Tolerated cephalosporins and zosyn     Roslynn Coombes, NP, PGY-2

## 2023-12-06 NOTE — Plan of Care (Signed)
  Problem: Clinical Measurements: Goal: Signs and symptoms of infection will decrease Outcome: Progressing   Problem: Respiratory: Goal: Ability to maintain adequate ventilation will improve Outcome: Progressing   Problem: Education: Goal: Ability to describe self-care measures that may prevent or decrease complications (Diabetes Survival Skills Education) will improve Outcome: Progressing

## 2023-12-06 NOTE — Progress Notes (Signed)
 Mobility Specialist Progress Note;   12/06/23 1455  Mobility  Activity Transferred from bed to chair  Level of Assistance Contact guard assist, steadying assist  Assistive Device None  Distance Ambulated (ft) 5 ft  Activity Response Tolerated well  Mobility Referral Yes  Mobility visit 1 Mobility  Mobility Specialist Start Time (ACUTE ONLY) 1455  Mobility Specialist Stop Time (ACUTE ONLY) 1502  Mobility Specialist Time Calculation (min) (ACUTE ONLY) 7 min   Pt bed alarm repeatedly going off, pt c/o her bottom hurting. Requested to sit in chair. Required MinG assistance to safely transfer pt from bed to chair. VSS on 4LO2 when able to receive accurate pleth. Pt left in chair with all needs met, alarm on.   Janit Meline Mobility Specialist Please contact via SecureChat or [employer] [phone]

## 2023-12-07 DIAGNOSIS — R6520 Severe sepsis without septic shock: Secondary | ICD-10-CM | POA: Diagnosis not present

## 2023-12-07 DIAGNOSIS — J189 Pneumonia, unspecified organism: Secondary | ICD-10-CM | POA: Diagnosis not present

## 2023-12-07 DIAGNOSIS — E872 Acidosis, unspecified: Secondary | ICD-10-CM | POA: Diagnosis not present

## 2023-12-07 DIAGNOSIS — A419 Sepsis, unspecified organism: Secondary | ICD-10-CM | POA: Diagnosis not present

## 2023-12-07 LAB — BASIC METABOLIC PANEL WITH GFR
Anion gap: 7 (ref 5–15)
BUN: 7 mg/dL (ref 6–20)
CO2: 28 mmol/L (ref 22–32)
Calcium: 8.2 mg/dL — ABNORMAL LOW (ref 8.9–10.3)
Chloride: 101 mmol/L (ref 98–111)
Creatinine, Ser: 0.47 mg/dL (ref 0.44–1.00)
GFR, Estimated: >60 mL/min (ref >60)
Glucose, Bld: 154 mg/dL — ABNORMAL HIGH (ref 70–99)
Potassium: 3.1 mmol/L — ABNORMAL LOW (ref 3.5–5.1)
Sodium: 136 mmol/L (ref 135–145)

## 2023-12-07 LAB — GLUCOSE, CAPILLARY
Glucose-Capillary: 100 mg/dL — ABNORMAL HIGH (ref 70–99)
Glucose-Capillary: 138 mg/dL — ABNORMAL HIGH (ref 70–99)
Glucose-Capillary: 164 mg/dL — ABNORMAL HIGH (ref 70–99)
Glucose-Capillary: 174 mg/dL — ABNORMAL HIGH (ref 70–99)
Glucose-Capillary: 187 mg/dL — ABNORMAL HIGH (ref 70–99)
Glucose-Capillary: 228 mg/dL — ABNORMAL HIGH (ref 70–99)
Glucose-Capillary: 294 mg/dL — ABNORMAL HIGH (ref 70–99)
Glucose-Capillary: 312 mg/dL — ABNORMAL HIGH (ref 70–99)

## 2023-12-07 LAB — CBC
HCT: 25.6 % — ABNORMAL LOW (ref 36.0–46.0)
Hemoglobin: 7.4 g/dL — ABNORMAL LOW (ref 12.0–15.0)
MCH: 21.6 pg — ABNORMAL LOW (ref 26.0–34.0)
MCHC: 28.9 g/dL — ABNORMAL LOW (ref 30.0–36.0)
MCV: 74.9 fL — ABNORMAL LOW (ref 80.0–100.0)
Platelets: 105 10*3/uL — ABNORMAL LOW (ref 150–400)
RBC: 3.42 MIL/uL — ABNORMAL LOW (ref 3.87–5.11)
RDW: 21.2 % — ABNORMAL HIGH (ref 11.5–15.5)
WBC: 5.9 10*3/uL (ref 4.0–10.5)
nRBC: 0 % (ref 0.0–0.2)

## 2023-12-07 LAB — PHOSPHORUS: Phosphorus: 4.7 mg/dL — ABNORMAL HIGH (ref 2.5–4.6)

## 2023-12-07 LAB — MAGNESIUM: Magnesium: 2 mg/dL (ref 1.7–2.4)

## 2023-12-07 MED ORDER — LORAZEPAM 2 MG/ML IJ SOLN
0.0000 mg | Freq: Two times a day (BID) | INTRAMUSCULAR | Status: AC
  Filled 2023-12-07: qty 1

## 2023-12-07 MED ORDER — THIAMINE HCL 100 MG/ML IJ SOLN: 100.0000 mg | Freq: Every day | INTRAMUSCULAR | Status: DC

## 2023-12-07 MED ORDER — LORAZEPAM 1 MG PO TABS
1.0000 mg | ORAL_TABLET | ORAL | Status: AC | PRN
  Administered 2023-12-08: 2 mg via ORAL
  Filled 2023-12-07: qty 2

## 2023-12-07 MED ORDER — POTASSIUM CHLORIDE CRYS ER 20 MEQ PO TBCR
40.0000 meq | EXTENDED_RELEASE_TABLET | Freq: Once | ORAL | Status: AC
  Administered 2023-12-07: 40 meq via ORAL
  Filled 2023-12-07: qty 2

## 2023-12-07 MED ORDER — LORAZEPAM 2 MG/ML IJ SOLN
0.0000 mg | Freq: Four times a day (QID) | INTRAMUSCULAR | Status: AC
  Administered 2023-12-07: 2 mg via INTRAVENOUS
  Administered 2023-12-08: 1 mg via INTRAVENOUS
  Filled 2023-12-07 (×2): qty 1

## 2023-12-07 MED ORDER — LORAZEPAM 2 MG/ML IJ SOLN
1.0000 mg | INTRAMUSCULAR | Status: AC | PRN
  Administered 2023-12-07: 1 mg via INTRAVENOUS
  Administered 2023-12-09 – 2023-12-10 (×5): 2 mg via INTRAVENOUS
  Filled 2023-12-07 (×5): qty 1

## 2023-12-07 MED ORDER — POTASSIUM CHLORIDE 20 MEQ PO PACK
40.0000 meq | PACK | Freq: Once | ORAL | Status: AC
  Administered 2023-12-07: 40 meq via ORAL
  Filled 2023-12-07: qty 2

## 2023-12-07 MED ORDER — ADULT MULTIVITAMIN W/MINERALS CH: 1.0000 | ORAL_TABLET | Freq: Every day | ORAL | Status: DC

## 2023-12-07 MED ORDER — HALOPERIDOL LACTATE 5 MG/ML IJ SOLN
5.0000 mg | Freq: Four times a day (QID) | INTRAMUSCULAR | Status: DC | PRN
  Administered 2023-12-09: 5 mg via INTRAVENOUS
  Filled 2023-12-07: qty 1

## 2023-12-07 MED ORDER — FOLIC ACID 1 MG PO TABS: 1.0000 mg | ORAL_TABLET | Freq: Every day | ORAL | Status: DC

## 2023-12-07 MED ORDER — THIAMINE MONONITRATE 100 MG PO TABS: 100.0000 mg | ORAL_TABLET | Freq: Every day | ORAL | Status: DC

## 2023-12-07 NOTE — Consult Note (Signed)
 [facility] Psychiatric Consult Follow-up Visit  Patient Name: .[name]  MRN: [id number]  DOB: [birth year]  Consult Order details:  Orders (From admission, onward)     Start     Ordered   06/12/23 0826  CONSULT TO CALL ACT TEAM       Ordering Provider: Jerilynn Montenegro, MD  Provider:  (Not yet assigned)  Question Answer Comment  Place call to: ACT   Reason for Consult Admit      06/12/23 0825   06/11/23 1913  CONSULT TO CALL ACT TEAM       Ordering Provider: Carin Charleston, MD  Provider:  (Not yet assigned)  Question:  Reason for Consult?  Answer:  IVC, agitation   06/11/23 1912             Mode of Visit: In person    Psychiatry Consult Evaluation  Service Date: December 07, 2023 LOS:  LOS: 6 days  Chief Complaint medication management  Primary Psychiatric Diagnoses  Polysubstance abuse 2.   MDD, remission  Assessment  [name] is a 58 y.o. female admitted: Presented to the EDfor 12/01/2023  8:40 PM with medical history significant for COPD, seizure disorder, iron  deficiency anemia, depression/anxiety, polysubstance use (EtOH, cocaine, tobacco), medical non-adherence and frequent ED visits who presented to the ED for evaluation of shortness of breath with right lower chest discomfort; diagnosed with pneumonia.  Patient is AA female, 58 years old who came in to the ER with shortness of breath and alcohol  and cocaine abuse.   On assessment, she denies withdrawal symptoms and cravings, minimizes her substance use with drinking only one 40-ounce Ice House (high alcohol  content) daily and denied other substance abuse despite being positive for cocaine.  She did stated she smokes a ppd of cigarettes.  She denied depression, anxiety, hallucinations, suicidal/homicidal ideations, and paranoia.  Her sleep was not that good, appetite is fine.  She reported being compliant with her medications prior to admission and need to get back on them in the hospital.    Prior to  the assessment, she was standing up beside her bed unsteadily and did sit when requested.  She stated she needed to go to the BR which was communicated to the nurse that was assisting her to the bedside commode.  The client reported she lives alone with supportive daughters nearby that get food and items she needs.  She did say she walks to the store to get alcohol .  Calm and cooperative on assessment with no abnormal behaviors.  12/08/2023: Pleasant and attentive to interview. No new acute psychiatric concerns today. Tolerating medications well. Slept better on Mirtazapine 7.5 mg last night. No SI, HI, or AVH. No schizophrenia symptoms. Is depressed related to her husbands passing last year. Amenable to outpatient follow-up.  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   Insomnia: - Continue Remeron 7.5 mg daily at bedtime  We will sign  off at this time.   ## Medical Decision Making Capacity:  Patient's legal guardian is her daughter [name]  Labs reviewed and EKG on 12/01/2023 was 414  ## Further Work-up: NA  ## Disposition:-- No barriers to discharge at this time. May discharge with outpatient resources to [facility] once medically stable.    ## Safety and Observation Level:  - Based on my clinical evaluation, I estimate the patient to be at low risk of self harm in the current setting. - At this time, we  recommend  routine.   CSSR Risk Category:C-SSRS RISK CATEGORY: No Risk  Suicide Risk Assessment: Patient has following modifiable risk factors for suicide: untreated depression, recklessness, and medication noncompliance, which we are addressing by encouraging patient to join [employer], Seek outpatient alcohol  treatment.  Patient has following non-modifiable or demographic risk factors for suicide: separation or divorce, history of self harm behavior, and psychiatric hospitalization Patient has the following protective factors against suicide: Supportive family and Supportive friends  Thank you for this consult request. Recommendations have been communicated to the primary team.  We will continue to follow from a distance.   [name] Miron, MD       History of Present Illness  Patient is AA female, 58 years old who came in to the ER with shortness of breath and alcohol  and cocaine abuse. She was found to have RLL pneumonia with sepsis, requiring ICU level care and intubation. Extubated 6/13. Started on Zosyn .   On assessment, she denies withdrawal symptoms and cravings, minimizes her substance use with drinking only one 40-ounce Ice House (high alcohol  content) daily and denied other substance abuse despite being positive for cocaine.  She did stated she smokes a ppd of cigarettes.  She denied depression, anxiety, hallucinations, suicidal/homicidal ideations, and paranoia.  Her sleep was not that good, appetite is fine.  She reported being compliant with her medications prior to admission and need to get back on them in the hospital.    Prior to the assessment, she was standing up beside her bed unsteadily and did sit when requested.  She stated she needed to go to the BR which was communicated to the nurse that was assisting her to the bedside commode.  The client reported she lives alone with supportive daughters nearby that get food and items she needs.  She did say she walks to the  store to get alcohol .  Calm and cooperative on assessment with no abnormal behaviors.  Review of Systems  Constitutional: Negative.   HENT: Negative.    Eyes: Negative.   Respiratory:  Positive for cough and shortness of breath.   Cardiovascular: Negative.   Gastrointestinal: Negative.   Genitourinary: Negative.   Musculoskeletal: Negative.   Skin: Negative.   Neurological: Negative.   Endo/Heme/Allergies: Negative.   Psychiatric/Behavioral:  Positive for substance abuse.        Alcoholism     Psychiatric and Social History  Psychiatric History:  Information collected from patient and chart  Prev Dx/Sx: MDD, polysubstance abuse Current Psych Provider: None Home Meds (current): None Previous Med Trials: Prozac , does not take anymore Therapy: Past history  Prior Psych Hospitalization: Yes Prior Self Harm: Yes Prior Violence: Yes  Family Psych History: Patient does not know Family Hx suicide: Patient denies  Social History:  Developmental Hx: Patient appears to be appropriate developmental age but Physically patient appears older than stated age. Educational Hx: High school Occupational Hx: Unemployed Legal Hx: None per patient Living Situation: Lives alone,  supportive daughter in the area Spiritual Hx: [employer] Access to weapons/lethal means: None  Substance History Alcohol : Yes Type of alcohol  Beer Last Drink prior to admission Number of drinks per day 1 40 ounce Ice House (high alcohol  content) daily History of alcohol  withdrawal seizures patient denies History of DT's patient denies Tobacco: Yes Illicit drugs: Yes cocaine Prescription drug abuse: Patient denies Rehab hx: Patient denies  Exam Findings  Physical Exam: Completed by the MD, reviewed by this provider. Vital Signs:  Temp:  [97.7 F (36.5 C)-98.6 F (37 C)] 97.8 F (36.6 C) (06/16 0753) Pulse Rate:  [99-117] 99 (06/16 0753) Resp:  [17-20] 20 (06/16 0753) BP: (94-139)/(47-76) 94/61 (06/16  0753) SpO2:  [93 %-100 %] 99 % (06/16 0753) Weight:  [53.8 kg] 53.8 kg (06/16 0332) Blood pressure 94/61, pulse 99, temperature 97.8 F (36.6 C), temperature source Oral, resp. rate 20, height 5' 2 (1.575 m), weight 53.8 kg, last menstrual period 01/08/2011, SpO2 99%. Body mass index is 21.69 kg/m.  Physical Exam Vitals (appears older than stated age.) and nursing note reviewed.  Constitutional:      Appearance: Normal appearance.  HENT:     Nose: Nose normal.  Pulmonary:     Effort: Pulmonary effort is normal.   Neurological:     Mental Status: She is alert and oriented to person, place, and time.   Psychiatric:        Attention and Perception: Attention normal.        Speech: Speech normal.        Behavior: Behavior is cooperative.        Thought Content: Thought content normal.        Cognition and Memory: Cognition and memory normal.     Mental Status Exam: General Appearance: Casual and Neat  Orientation:  Full (Time, Place, and Person)  Memory:  Immediate;   Good Remote;   Good  Concentration:  Concentration: Good and Attention Span: Good  Recall:  Good  Attention  Good  Eye Contact:  Good  Speech:  Clear and Coherent  Language:  Good  Volume:  Normal  Mood: euthymic  Affect:  Appropriate  Thought Process:  Coherent  Thought Content:  WDL  Suicidal Thoughts:  No  Homicidal Thoughts:  No  Judgement:  Good  Insight:  Good  Psychomotor Activity:  Normal  Akathisia:  No  Fund of Knowledge:  Fair      Assets:  Manufacturing systems engineer Desire for Improvement Housing Social Support  Cognition:  WNL  ADL's:  Intact  AIMS (if indicated):        Other History   These have been pulled in through the EMR, reviewed, and updated if appropriate.  Family History:  The patient's family history includes COPD in an other family member; Diabetes in her mother; Heart attack in her father and sister; Heart disease in her mother; Hypertension in her mother.  Medical  History: Past Medical History:  Diagnosis Date   Abdominal pain    Accidental drug overdose April 2013   Anxiety    Atrial fibrillation (HCC) 09/29/11   converted spontaneously   Chronic back pain    Chronic knee pain    Chronic nausea    Chronic pain    COPD (chronic obstructive pulmonary disease) (HCC)    Depression    Diabetes mellitus    states her doctor took her off all DM meds in past month   Diabetic neuropathy (HCC)    Dyspnea  with exertion    GERD (gastroesophageal reflux disease)    Headache(784.0)    migraines    HTN (hypertension)    not on meds since in a year    Hyperlipidemia    Hypothyroidism    not on meds in a while    Mental disorder    Bipolar and schizophrenic   Nausea and vomiting 01/02/2023   Requires supplemental oxygen     as needed per patient    Schizophrenia (HCC)    Schizophrenia, acute (HCC) 11/13/2017   Tobacco abuse     Surgical History: Past Surgical History:  Procedure Laterality Date   ABDOMINAL HYSTERECTOMY     BLADDER SUSPENSION  03/04/2011   Procedure: [facility] PROCEDURE;  Surgeon: Jackelyn Marvel MacDiarmid;  Location: [facility];  Service: Urology;  Laterality: N/A;   BOWEL RESECTION N/A 04/18/2022   Procedure: SMALL BOWEL RESECTION;  Surgeon: Sim Dryer, MD;  Location: [facility] OR;  Service: General;  Laterality: N/A;   CYSTOCELE REPAIR  03/04/2011   Procedure: ANTERIOR REPAIR (CYSTOCELE);  Surgeon: Devorah Fonder;  Location: [facility];  Service: Urology;  Laterality: N/A;   CYSTOSCOPY  03/04/2011   Procedure: CYSTOSCOPY;  Surgeon: Jackelyn Marvel MacDiarmid;  Location: [facility];  Service: Urology;  Laterality: N/A;   ESOPHAGOGASTRODUODENOSCOPY (EGD) WITH PROPOFOL  N/A 05/12/2017   Procedure: ESOPHAGOGASTRODUODENOSCOPY (EGD) WITH PROPOFOL ;  Surgeon: Juanita Norlander, MD;  Location: [facility];  Service: General;  Laterality: N/A;   GASTRIC ROUX-EN-Y N/A 03/25/2016   Procedure: LAPAROSCOPIC ROUX-EN-Y GASTRIC BYPASS WITH UPPER ENDOSCOPY;  Surgeon: Ayesha Lente, MD;  Location: [facility];  Service: General;  Laterality: N/A;   KNEE SURGERY     LAPAROSCOPIC ASSISTED VAGINAL HYSTERECTOMY  03/04/2011   Procedure: LAPAROSCOPIC ASSISTED VAGINAL HYSTERECTOMY;  Surgeon: Martine Sleek, MD;  Location: [facility];  Service: Gynecology;  Laterality: N/A;   LAPAROTOMY N/A 04/18/2022   Procedure: EXPLORATORY LAPAROTOMY;  Surgeon: Sim Dryer, MD;  Location: [facility];  Service: General;  Laterality: N/A;   LAPAROTOMY N/A 04/24/2022   Procedure: BRING BACK EXPLORATORY LAPAROTOMY;  Surgeon: Dorena Gander, MD;  Location[facility] OR;  Service: General;  Laterality: N/A;   TOTAL HIP ARTHROPLASTY Right 08/27/2022   Procedure: TOTAL HIP ARTHROPLASTY;  Surgeon: Murleen Arms, MD;  Location: [facility] OR;  Service: Orthopedics;  Laterality: Right;     Medications:   Current Facility-Administered Medications:    acetaminophen  (TYLENOL ) tablet 650 mg, 650 mg, Oral, Q6H PRN, 650 mg at 12/07/23 0832 **OR** acetaminophen  (TYLENOL ) suppository 650 mg, 650 mg, Rectal, Q6H PRN, Agarwala, Ravi, MD   arformoterol  (BROVANA ) nebulizer solution 15 mcg, 15 mcg, Nebulization, BID, Patel, Vishal R, MD, 15 mcg at 12/07/23 1610   ARIPiprazole  (ABILIFY ) tablet 10 mg, 10 mg, Oral, Daily, Agarwala, Ravi, MD, 10 mg at 12/06/23 0909   budesonide  (PULMICORT ) nebulizer solution 0.25 mg, 0.25 mg, Nebulization, BID, Patel, Vishal R, MD, 0.25 mg at 12/07/23 9604   Chlorhexidine  Gluconate Cloth 2 % PADS 6 each, 6 each, Topical, Daily, Josiah Nigh, MD, 6 each at 12/06/23 5409   divalproex  (DEPAKOTE  SPRINKLE) capsule 250 mg, 250 mg, Oral, Q12H, Agarwala, Ravi, MD, 250 mg at 12/07/23 0046   docusate sodium  (COLACE) capsule 100 mg, 100 mg, Oral, BID, Agarwala, Ravi, MD   enoxaparin  (LOVENOX ) injection 40 mg, 40 mg, Subcutaneous, Daily, Patel, Vishal R, MD, 40 mg at 12/06/23 0908   feeding supplement (OSMOLITE 1.2 CAL) liquid 1,000 mL, 1,000 mL, Per Tube, Continuous, Josiah Nigh, MD, Stopped at  12/06/23 0901   feeding supplement (PROSource TF20) liquid 60 mL, 60 mL, Per Tube, Daily, Josiah Nigh, MD, 60 mL at 12/05/23 1610   folic acid  (FOLVITE ) tablet 1 mg, 1 mg, Oral, Daily, Agarwala, Ravi, MD, 1 mg at 12/06/23 0909   Gerhardt's butt cream, , Topical, PRN, Oral [name], MD, Given at 12/06/23 1636   guaiFENesin  (ROBITUSSIN) 100 MG/5ML liquid 20 mL, 20 mL, Oral, Q8H, Agarwala, Ravi, MD, 20 mL at 12/07/23 0502   insulin  aspart (novoLOG ) injection 0-15 Units, 0-15 Units, Subcutaneous, Q4H, Josiah Nigh, MD, 3 Units at 12/07/23 0831   ipratropium-albuterol  (DUONEB) 0.5-2.5 (3) MG/3ML nebulizer solution 3 mL, 3 mL, Nebulization, Q6H PRN, Edith Gores R, MD, 3 mL at 12/02/23 0042   levETIRAcetam  (KEPPRA ) tablet 500 mg, 500 mg, Oral, BID, Agarwala, Ravi, MD, 500 mg at 12/06/23 2042   loperamide  (IMODIUM ) capsule 2 mg, 2 mg, Oral, PRN, Oral [name], MD, 2 mg at 12/07/23 0502   LORazepam  (ATIVAN ) tablet 1-4 mg, 1-4 mg, Oral, Q1H PRN **OR** LORazepam  (ATIVAN ) injection 1-4 mg, 1-4 mg, Intravenous, Q1H PRN, Agarwala, Ravi, MD   mirtazapine (REMERON) tablet 7.5 mg, 7.5 mg, Oral, QHS, Lord, Jamison Y, NP, 7.5 mg at 12/06/23 2042   multivitamin with minerals tablet 1 tablet, 1 tablet, Oral, Daily, Agarwala, Ravi, MD, 1 tablet at 12/06/23 0909   mupirocin  ointment (BACTROBAN ) 2 % 1 Application, 1 Application, Nasal, BID, Joesph Mussel, DO, 1 Application at 12/06/23 2042   nicotine  (NICODERM CQ  - dosed in mg/24 hours) patch 21 mg, 21 mg, Transdermal, Daily, Patel, Vishal R, MD, 21 mg at 12/06/23 9604   ondansetron  (ZOFRAN ) tablet 4 mg, 4 mg, Oral, Q6H PRN **OR** ondansetron  (ZOFRAN ) injection 4 mg, 4 mg, Intravenous, Q6H PRN, Edith Gores R, MD, 4 mg at 12/06/23 1712   Oral care mouth rinse, 15 mL, Mouth Rinse, PRN, Josiah Nigh, MD   Oral care mouth rinse, 15 mL, Mouth Rinse, 4 times per day, Jaquita Merl P, DO, 15 mL at 12/07/23 5409   Oral care mouth rinse, 15 mL, Mouth Rinse, PRN,  Jaquita Merl P, DO   polyethylene glycol (MIRALAX  / GLYCOLAX ) packet 17 g, 17 g, Oral, Daily, Agarwala, Ravi, MD   potassium chloride  SA (KLOR-CON  M) CR tablet 40 mEq, 40 mEq, Oral, Once, Oral [name], MD   predniSONE  (DELTASONE ) tablet 20 mg, 20 mg, Oral, Q breakfast, Agarwala, Ravi, MD, 20 mg at 12/07/23 8119   revefenacin  (YUPELRI ) nebulizer solution 175 mcg, 175 mcg, Nebulization, Daily, Josiah Nigh, MD, 175 mcg at 12/07/23 1478   senna-docusate (Senokot-S) tablet 1 tablet, 1 tablet, Oral, QHS PRN, Agarwala, Ravi, MD   sodium chloride  flush (NS) 0.9 % injection 10-40 mL, 10-40 mL, Intracatheter, Q12H, Josiah Nigh, MD, 10 mL at 12/06/23 2213   sodium chloride  flush (NS) 0.9 % injection 10-40 mL, 10-40 mL, Intracatheter, PRN, Josiah Nigh, MD   sodium chloride  flush (NS) 0.9 % injection 3 mL, 3 mL, Intravenous, Q12H, Patel, Vishal R, MD, 3 mL at 12/06/23 0915   thiamine  (VITAMIN B1) tablet 100 mg, 100 mg, Per Tube, Daily, 100 mg at 12/06/23 0909 **OR** thiamine  (VITAMIN B1) injection 100 mg, 100 mg, Intravenous, Daily, Josiah Nigh, MD   vancomycin  (VANCOREADY) IVPB 500 mg/100 mL, 500 mg, Intravenous, Q12H, Bowser, Grace E, NP, Last Rate: 100 mL/hr at 12/06/23 2212, 500 mg at 12/06/23 2212   zonisamide  (ZONEGRAN ) capsule 200 mg, 200 mg, Oral, Daily, Felton Hough  C, MD, 200 mg at 12/06/23 0909  Allergies: Allergies  Allergen Reactions   Iron  Dextran Shortness Of Breath and Anxiety   Aspirin  Nausea And Vomiting and Other (See Comments)    Ok to take tylenol  or ibuprofen     Penicillins Other (See Comments)    Unknown reaction from childhood: family would like this to remain as an allergy.  Tolerated cephalosporins and zosyn     Yanelly Cantrelle, MD, PGY-2

## 2023-12-07 NOTE — Evaluation (Signed)
 Occupational Therapy Evaluation Patient Details Name: [name] MRN: [id number] DOB: [birth year] Today's Date: 12/07/2023   History of Present Illness   Pt is 58 year old presented to [facility] on  12/01/23 for SOB, RUQ and Rt ches pain. Pt with acute hypoxic resp failure with sepsis due to PNA. Intubated 6/11 and extubated 6/13. Pt also with acute metabolic encephalopathy. PMH - alcohol  use disorder, polysubstance use, a-fib not on anticoagulation, COPD, seizure disorder, IDA, B12 deficiency, hypothyroidism, schizophrenia.     Clinical Impressions Pt ind at baseline with ADLs/functional mobility, lives alone but daughter lives nearby and assists PRN. Pt currently needing up to mod A fro ADLs and CGA for transfers with RW. Pt noted to have incr ant/post sway with static standing without UE support on RW. Pt presenting with impairments listed below, will follow acutely. Patient will benefit from continued inpatient follow up therapy, <3 hours/day to maximize safety/ind with ADL/functional mobility, unless pt can stay with daughter or have 24/7 assist, then could return home with home health therapy.      If plan is discharge home, recommend the following:   A little help with walking and/or transfers;A lot of help with bathing/dressing/bathroom;Assistance with cooking/housework;Direct supervision/assist for medications management;Direct supervision/assist for financial management;Assist for transportation;Help with stairs or ramp for entrance     Functional Status Assessment   Patient has had a recent decline in their functional status and demonstrates the ability to make significant improvements in function in a reasonable and predictable amount of time.     Equipment Recommendations   Tub/shower seat     Recommendations for Other Services   PT consult     Precautions/Restrictions   Precautions Precautions: Fall Restrictions Weight Bearing Restrictions Per Provider Order:  No     Mobility Bed Mobility               General bed mobility comments: OOB in chair upon arrival and departure    Transfers Overall transfer level: Needs assistance Equipment used: Rolling walker (2 wheels) Transfers: Sit to/from Stand, Bed to chair/wheelchair/BSC Sit to Stand: Contact guard assist           General transfer comment: ant/post sway with static standing      Balance Overall balance assessment: Needs assistance Sitting-balance support: No upper extremity supported, Feet supported Sitting balance-Leahy Scale: Good     Standing balance support: No upper extremity supported, During functional activity Standing balance-Leahy Scale: Fair                             ADL either performed or assessed with clinical judgement   ADL Overall ADL's : Needs assistance/impaired Eating/Feeding: Set up;Sitting   Grooming: Set up;Standing   Upper Body Bathing: Minimal assistance;Sitting   Lower Body Bathing: Moderate assistance;Sitting/lateral leans;Sit to/from stand   Upper Body Dressing : Minimal assistance;Sitting   Lower Body Dressing: Sitting/lateral leans;Minimal assistance   Toilet Transfer: Contact guard assist;Ambulation;Regular Toilet;Rolling walker (2 wheels)   Toileting- Clothing Manipulation and Hygiene: Contact guard assist       Functional mobility during ADLs: Contact guard assist;Rolling walker (2 wheels)       Vision   Vision Assessment?: No apparent visual deficits     Perception Perception: Not tested       Praxis Praxis: Not tested       Pertinent Vitals/Pain Pain Assessment Pain Assessment: No/denies pain     Extremity/Trunk Assessment Upper Extremity Assessment Upper  Extremity Assessment: Generalized weakness   Lower Extremity Assessment Lower Extremity Assessment: Defer to PT evaluation   Cervical / Trunk Assessment Cervical / Trunk Assessment: Normal   Communication  Communication Communication: No apparent difficulties   Cognition Arousal: Alert Behavior During Therapy: Flat affect Cognition: No family/caregiver present to determine baseline             OT - Cognition Comments: oriented to date and why she is at the hospital, provides PLOF appropriately                 Following commands: Intact       Cueing  General Comments   Cueing Techniques: Verbal cues;Tactile cues  VSS on 4L O2   Exercises     Shoulder Instructions      Home Living Family/patient expects to be discharged to:: Private residence Living Arrangements: Alone Available Help at Discharge: Family;Available PRN/intermittently (daughter lives across the street) Type of Home: House Home Access: Level entry     Home Layout: One level     Bathroom Shower/Tub: Chief Strategy Officer: Standard Bathroom Accessibility: Yes   Home Equipment: Agricultural consultant (2 wheels);Cane - single point   Additional Comments: reportts wearing 4-5L at home every now and then      Prior Functioning/Environment Prior Level of Function : Independent/Modified Independent;Patient poor historian/Family not available             Mobility Comments: No assistive device per pt ADLs Comments: ind, does not drive    OT Problem List: Decreased strength;Decreased range of motion;Decreased activity tolerance;Impaired balance (sitting and/or standing);Decreased cognition;Cardiopulmonary status limiting activity   OT Treatment/Interventions: Self-care/ADL training;Therapeutic exercise;Energy conservation;DME and/or AE instruction;Therapeutic activities;Patient/family education;Balance training      OT Goals(Current goals can be found in the care plan section)   Acute Rehab OT Goals Patient Stated Goal: none stated OT Goal Formulation: With patient Time For Goal Achievement: 12/21/23 Potential to Achieve Goals: Good ADL Goals Pt Will Perform Upper Body Dressing:  with modified independence;sitting Pt Will Perform Lower Body Dressing: with modified independence;sitting/lateral leans;sit to/from stand Pt Will Transfer to Toilet: with modified independence;ambulating;regular height toilet Pt Will Perform Tub/Shower Transfer: Tub transfer;Shower transfer;with modified independence;ambulating Additional ADL Goal #1: pt will perform pillbox assessment with passing score in prep for IADLs   OT Frequency:  Min 2X/week    Co-evaluation              AM-PAC OT 6 Clicks Daily Activity     Outcome Measure Help from another person eating meals?: A Little Help from another person taking care of personal grooming?: A Little Help from another person toileting, which includes using toliet, bedpan, or urinal?: A Little Help from another person bathing (including washing, rinsing, drying)?: A Lot Help from another person to put on and taking off regular upper body clothing?: A Little Help from another person to put on and taking off regular lower body clothing?: A Little 6 Click Score: 17   End of Session Equipment Utilized During Treatment: Gait belt;Rolling walker (2 wheels);Oxygen  (4L) Nurse Communication: Mobility status  Activity Tolerance: Patient tolerated treatment well Patient left: in chair;with call bell/phone within reach;with chair alarm set  OT Visit Diagnosis: Unsteadiness on feet (R26.81);Other abnormalities of gait and mobility (R26.89);Muscle weakness (generalized) (M62.81)                Time: 6045-4098 OT Time Calculation (min): 19 min Charges:  OT General Charges $OT Visit: 1 Visit OT  Evaluation $OT Eval Low Complexity: 1 Low  Benedict Brain, OTD, OTR/L SecureChat Preferred Acute Rehab [phone]   Antionette Kirks 12/07/2023, 10:58 AM

## 2023-12-07 NOTE — TOC Initial Note (Addendum)
 Transition of Care [facility]) - Initial/Assessment Note    Patient Details  Name: [name] MRN: [id number] Date of Birth: 12/28/1965  Transition of Care [facility]) CM/SW Contact:    Juliane Och, LCSW Phone Number: 12/07/2023, 1:03 PM  Clinical Narrative:                  1:03 PM CSW introduced self and role to patient. CSW informed patient of physical therapy recommendation of patient discharging to SNF. Patient was agreeable to recommendation and consented CSW to send referrals to SNFs within [facility]. Patient requested CSW to contact daughter, [name], to inform her of disposition plan and options. CSW relayed information to [name]. CSW provided patient with transportation and substance use resources. Per chart review, patient has insurance but not a PCP. Patient does not have SNF history. Patient has HH history with [facility]. Patient has DME at home (cane) and DME history with [employer] for oxygen . Patient resided at home prior to admission. Patient's daughter informed CSW that [name] is patient's primary caregiver. Patient's preferred pharmacy's are [facility] [address] and [facility] [address] Patient's PASRR is pending additional clinicals for review.  Expected Discharge Plan: Skilled Nursing Facility Barriers to Discharge: Continued Medical Work up, SNF Pending bed offer, Insurance Authorization   Patient Goals and CMS Choice Patient states their goals for this hospitalization and ongoing recovery are:: SNF          Expected Discharge Plan and Services In-house Referral: Clinical Social Work   Post Acute Care Choice: Skilled Nursing Facility Living arrangements for the past 2 months: Single Family Home                                      Prior Living Arrangements/Services Living arrangements for the past 2 months: Single Family Home Lives with:: Self Patient language and need for interpreter reviewed:: Yes              Criminal Activity/Legal  Involvement Pertinent to Current Situation/Hospitalization: No - Comment as needed  Activities of Daily Living   ADL Screening (condition at time of admission) Independently performs ADLs?: No Does the patient have a NEW difficulty with bathing/dressing/toileting/self-feeding that is expected to last >3 days?: No Does the patient have a NEW difficulty with getting in/out of bed, walking, or climbing stairs that is expected to last >3 days?: No Does the patient have a NEW difficulty with communication that is expected to last >3 days?: No Is the patient deaf or have difficulty hearing?: No Does the patient have difficulty seeing, even when wearing glasses/contacts?: No Does the patient have difficulty concentrating, remembering, or making decisions?: No  Permission Sought/Granted Permission sought to share information with : Family Supports, Oceanographer granted to share information with : Yes, Verbal Permission Granted  Share Information with NAME: [name]  Permission granted to share info w AGENCY: SNF  Permission granted to share info w Relationship: Daughter/POA  Permission granted to share info w Contact Information: [phone]  Emotional Assessment Appearance:: Appears stated age Attitude/Demeanor/Rapport: Engaged Affect (typically observed): Accepting, Appropriate, Adaptable, Calm, Stable, Pleasant Orientation: : Oriented to Self, Oriented to Place, Oriented to  Time, Oriented to Situation Alcohol  / Substance Use: Not Applicable Psych Involvement: No (comment)  Admission diagnosis:  SOB (shortness of breath) [R06.02] Community acquired pneumonia, unspecified laterality [J18.9] Severe sepsis with lactic acidosis (HCC) [A41.9, R65.20, E87.20] Patient Active  Problem List   Diagnosis Date Noted   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) 12/06/2023   History of syphilis 11/09/2023   Type 2 diabetes mellitus with diabetic  polyneuropathy, with long-term current use of insulin  (HCC) 05/02/2023   Continuous dependence on cigarette smoking 02/22/2023   Alcohol  use disorder, severe, dependence (HCC) 01/03/2023   Aspiration pneumonia of right lower lobe (HCC) 11/20/2022   Cocaine abuse (HCC) 08/23/2022   Memory loss 04/17/2022   Seizure disorder (HCC) 10/10/2020   Primary insomnia 09/08/2014   Encounter for monitoring opioid maintenance therapy 07/10/2014   Chronic pain 11/27/2012   COPD with acute exacerbation (HCC) 09/29/2011   PCP:  Patient, No Pcp Per Pharmacy:   [id number] - [place], [place] - [address] [facility] [place] [address] Phone: [phone] Fax: [phone]  [id number] [place], [place] - [address] [place] [address] Phone: [phone] Fax: [phone]     Social Drivers of Health (SDOH) Social History: SDOH Screenings   Food Insecurity: Patient Unable To Answer (12/02/2023)  Housing: Patient Unable To Answer (12/02/2023)  Transportation Needs: Patient Unable To Answer (12/02/2023)  Utilities: Patient Unable To Answer (12/02/2023)  Alcohol  Screen: Low Risk  (04/29/2023)  Social Connections: Socially Isolated (11/04/2023)  Stress: Stress Concern Present (04/08/2023)  Tobacco Use: High Risk (12/01/2023)   SDOH Interventions:     Readmission Risk Interventions    12/02/2023   12:47 PM 11/05/2023    1:34 PM 12/12/2022    7:47 AM  Readmission Risk Prevention Plan  Transportation Screening Complete Complete Complete  Medication Review (RN Care Manager) Referral to Pharmacy Referral to Pharmacy Referral to Pharmacy  PCP or Specialist appointment within 3-5 days of discharge Complete Complete Complete  HRI or Home Care Consult Complete Complete Complete  SW Recovery Care/Counseling Consult Complete Complete Complete  Palliative Care Screening Not Applicable Not Applicable Not Applicable   Skilled Nursing Facility Not Applicable Not Applicable Not Applicable

## 2023-12-07 NOTE — NC FL2 (Signed)
 [place]  MEDICAID FL2 LEVEL OF CARE FORM     IDENTIFICATION  Patient Name: LEXINGTON DEVINE Birthdate: 1965/08/01 Sex: female Admission Date (Current Location): 12/01/2023  [facility] and [place] Number:  Producer, television/film/video and Address:  [facility]. [facility], [address] [place], [place] [address]      Provider Number: [id number]  Attending Physician Name and Address:  Oral [name], MD  Relative Name and Phone Number:  [name]; Daughter/POA; [phone]    Current Level of Care: Hospital Recommended Level of Care: Skilled Nursing Facility Prior Approval Number:    Date Approved/Denied:   PASRR Number:    Discharge Plan: SNF    Current Diagnoses: Patient Active Problem List   Diagnosis Date Noted   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) 12/06/2023   History of syphilis 11/09/2023   Type 2 diabetes mellitus with diabetic polyneuropathy, with long-term current use of insulin  (HCC) 05/02/2023   Continuous dependence on cigarette smoking 02/22/2023   Alcohol  use disorder, severe, dependence (HCC) 01/03/2023   Aspiration pneumonia of right lower lobe (HCC) 11/20/2022   Cocaine abuse (HCC) 08/23/2022   Memory loss 04/17/2022   Seizure disorder (HCC) 10/10/2020   Primary insomnia 09/08/2014   Encounter for monitoring opioid maintenance therapy 07/10/2014   Chronic pain 11/27/2012   COPD with acute exacerbation (HCC) 09/29/2011    Orientation RESPIRATION BLADDER Height & Weight     Self, Situation, Place  O2 (4L nasal cannula) Continent Weight: 118 lb 9.7 oz (53.8 kg) Height:  5' 2 (157.5 cm)  BEHAVIORAL SYMPTOMS/MOOD NEUROLOGICAL BOWEL NUTRITION STATUS    Convulsions/Seizures (Seizure Disorder) Continent Diet (Please see discharge summary)  AMBULATORY STATUS COMMUNICATION OF NEEDS Skin   Limited Assist Verbally Normal                       Personal Care Assistance Level of Assistance  Bathing, Dressing, Feeding Bathing  Assistance: Limited assistance Feeding assistance: Limited assistance Dressing Assistance: Limited assistance     Functional Limitations Info  Sight Sight Info: Impaired (R and L)        SPECIAL CARE FACTORS FREQUENCY  PT (By licensed PT), OT (By licensed OT)     PT Frequency: 5x OT Frequency: 5x            Contractures Contractures Info: Not present    Additional Factors Info  Insulin  Sliding Scale, Psychotropic Code Status Info: Full Code Allergies Info: Iron  Dextran; Aspirin ; Penicillins Psychotropic Info: Please see discharge summary Insulin  Sliding Scale Info: Please see discharge summary       Current Medications (12/07/2023):  This is the current hospital active medication list Current Facility-Administered Medications  Medication Dose Route Frequency Provider Last Rate Last Admin   acetaminophen  (TYLENOL ) tablet 650 mg  650 mg Oral Q6H PRN Agarwala, Ravi, MD   650 mg at 12/07/23 8413   Or   acetaminophen  (TYLENOL ) suppository 650 mg  650 mg Rectal Q6H PRN Arlina Lair, MD       arformoterol  (BROVANA ) nebulizer solution 15 mcg  15 mcg Nebulization BID Patel, Vishal R, MD   15 mcg at 12/07/23 2440   ARIPiprazole  (ABILIFY ) tablet 10 mg  10 mg Oral Daily Agarwala, Ravi, MD   10 mg at 12/07/23 1010   budesonide  (PULMICORT ) nebulizer solution 0.25 mg  0.25 mg Nebulization BID Patel, Vishal R, MD   0.25 mg at 12/07/23 0837   Chlorhexidine  Gluconate Cloth 2 % PADS 6  each  6 each Topical Daily Josiah Nigh, MD   6 each at 12/07/23 1011   divalproex  (DEPAKOTE  SPRINKLE) capsule 250 mg  250 mg Oral Q12H Agarwala, [name] Slack, MD   250 mg at 12/07/23 1008   docusate sodium  (COLACE) capsule 100 mg  100 mg Oral BID Agarwala, Ravi, MD       enoxaparin  (LOVENOX ) injection 40 mg  40 mg Subcutaneous Daily Patel, Vishal R, MD   40 mg at 12/07/23 1011   feeding supplement (OSMOLITE 1.2 CAL) liquid 1,000 mL  1,000 mL Per Tube Continuous Smith, Daniel C, MD   Stopped at 12/06/23 0901    feeding supplement (PROSource TF20) liquid 60 mL  60 mL Per Tube Daily Josiah Nigh, MD   60 mL at 12/05/23 7829   folic acid  (FOLVITE ) tablet 1 mg  1 mg Oral Daily Agarwala, Ravi, MD   1 mg at 12/07/23 1008   Gerhardt's butt cream   Topical PRN Oral [name], MD   Given at 12/06/23 1636   guaiFENesin  (ROBITUSSIN) 100 MG/5ML liquid 20 mL  20 mL Oral Q8H Agarwala, [name] Slack, MD   20 mL at 12/07/23 0502   insulin  aspart (novoLOG ) injection 0-15 Units  0-15 Units Subcutaneous Q4H Josiah Nigh, MD   5 Units at 12/07/23 1147   ipratropium-albuterol  (DUONEB) 0.5-2.5 (3) MG/3ML nebulizer solution 3 mL  3 mL Nebulization Q6H PRN Patel, Vishal R, MD   3 mL at 12/02/23 0042   levETIRAcetam  (KEPPRA ) tablet 500 mg  500 mg Oral BID Agarwala, Ravi, MD   500 mg at 12/07/23 1008   loperamide  (IMODIUM ) capsule 2 mg  2 mg Oral PRN Oral [name], MD   2 mg at 12/07/23 0502   LORazepam  (ATIVAN ) tablet 1-4 mg  1-4 mg Oral Q1H PRN Arlina Lair, MD       Or   LORazepam  (ATIVAN ) injection 1-4 mg  1-4 mg Intravenous Q1H PRN Agarwala, Ravi, MD       mirtazapine (REMERON) tablet 7.5 mg  7.5 mg Oral QHS Lissa Riding, NP   7.5 mg at 12/06/23 2042   multivitamin with minerals tablet 1 tablet  1 tablet Oral Daily Agarwala, Ravi, MD   1 tablet at 12/07/23 1008   mupirocin  ointment (BACTROBAN ) 2 % 1 Application  1 Application Nasal BID Jaquita Merl P, DO   1 Application at 12/07/23 1010   nicotine  (NICODERM CQ  - dosed in mg/24 hours) patch 21 mg  21 mg Transdermal Daily Patel, Vishal R, MD   21 mg at 12/07/23 1005   ondansetron  (ZOFRAN ) tablet 4 mg  4 mg Oral Q6H PRN Patel, Vishal R, MD       Or   ondansetron  (ZOFRAN ) injection 4 mg  4 mg Intravenous Q6H PRN Patel, Vishal R, MD   4 mg at 12/06/23 1712   Oral care mouth rinse  15 mL Mouth Rinse PRN Josiah Nigh, MD       Oral care mouth rinse  15 mL Mouth Rinse 4 times per day Jaquita Merl P, DO   15 mL at 12/07/23 5621   Oral care mouth rinse  15 mL Mouth Rinse PRN  Jaquita Merl P, DO       polyethylene glycol (MIRALAX  / GLYCOLAX ) packet 17 g  17 g Oral Daily Agarwala, Ravi, MD       predniSONE  (DELTASONE ) tablet 20 mg  20 mg Oral Q breakfast Agarwala, Ravi, MD   20 mg at  12/07/23 0832   revefenacin  (YUPELRI ) nebulizer solution 175 mcg  175 mcg Nebulization Daily Josiah Nigh, MD   175 mcg at 12/07/23 0837   senna-docusate (Senokot-S) tablet 1 tablet  1 tablet Oral QHS PRN Agarwala, Ravi, MD       sodium chloride  flush (NS) 0.9 % injection 10-40 mL  10-40 mL Intracatheter Q12H Josiah Nigh, MD   10 mL at 12/07/23 1013   sodium chloride  flush (NS) 0.9 % injection 10-40 mL  10-40 mL Intracatheter PRN Josiah Nigh, MD       sodium chloride  flush (NS) 0.9 % injection 3 mL  3 mL Intravenous Q12H Patel, Vishal R, MD   3 mL at 12/06/23 0915   thiamine  (VITAMIN B1) tablet 100 mg  100 mg Per Tube Daily Josiah Nigh, MD   100 mg at 12/06/23 1610   Or   thiamine  (VITAMIN B1) injection 100 mg  100 mg Intravenous Daily Josiah Nigh, MD   100 mg at 12/07/23 1012   zonisamide  (ZONEGRAN ) capsule 200 mg  200 mg Oral Daily Josiah Nigh, MD   200 mg at 12/07/23 1008     Discharge Medications: Please see discharge summary for a list of discharge medications.  Relevant Imaging Results:  Relevant Lab Results:   Additional Information SS# [id number]  Juliane Och, LCSW

## 2023-12-07 NOTE — Care Management Important Message (Signed)
 Important Message  Patient Details  Name: [name] MRN: [id number] Date of Birth: December 13, 1965   Important Message Given:  Yes - Medicare IM     Wynonia Hedges 12/07/2023, 3:21 PM

## 2023-12-07 NOTE — Progress Notes (Signed)
 Nutrition Follow-up  DOCUMENTATION CODES:   Severe malnutrition in context of acute illness/injury  INTERVENTION:  Snacks TID between meals to augment intake  Add Magic cup TID with meals, each supplement provides 290 kcal and 9 grams of protein   Continue MVI w/ minerals  NUTRITION DIAGNOSIS:  Severe Malnutrition related to acute illness (hypoxemia, lactic acidosis, RLL infiltrate w/ effusion) as evidenced by severe muscle depletion, moderate fat depletion.  GOAL:  Patient will meet greater than or equal to 90% of their needs - progressing  MONITOR:  PO intake, Supplement acceptance, Weight trends  REASON FOR ASSESSMENT:  Ventilator   ASSESSMENT:   Pt seen multiple times in the ER within the last week with c/o SOB, normal CXR on exam. Currently admitted with new hypoxemia, lactic acidosis, and RLL infiltrate w/effusion. PMH significant for cocaine/alcohol  abuse, anxiety/schizophrenia, COPD, seizure disorder, gastric bypass with hx micronutrient deficiencies and malnutrition  6/11 intubated, s/p bronchoscopy 6/13 Cortrak placed, extubated; TF initiated at trickle rate 6/14 TF goal rate achieved 6/15 Cortrak removed; mirtazapine started  Mental status has improved. No hallucinations. Psych signed off today. CIWA score minimal.   Average Meal Intake 6/15: 50-100% x3 documented meals 6/16: 90% x1 documented meal  Patient eating lunch meal during visit. Vigorous appetite. No difficulties chewing or swallowing, despite only having lower dentures in place. She reports this is baseline. Cannot endorse typical day's intake, but states she was getting in at least two meals per day prior to admission. Amicable to snacks between meals in lieu of ONS. Added Magic Cup to meal trays.   Admit Weight: 58kg (pulled forward from previous admissions) 6/15: 54.1kg - likely actual true body weight Current Weight: 53.8kg + mild edema to BUEs  Also cannot endorse UBW. Unsure of recent trend  as admission weight appears pulled forward from multiple prior admissions. Bowels stable. Some mild edema to BUEs, on exam.   Drains/Lines: LIJ: CVC: triple lumen (placed 6/11) UOP: + unmeasured output (inaccurate documentation)  Supplementing potassium. Regular diet in place. PHOS marginally elevated today. Continues on mirtazapine which, along with prednisone  administration, is likely aiding in driving appetite.   Meds: docusate sodium , folic acid , SSI 0-15 q4, mirtazapine, MVI, Miralax , KCl, prednisone , thiamine   Labs:  K+ 3.3>3.5>3.1 (L) PHOS 4.7 (H) Hgb 8.2>7.7>7.4 (L) CBGs 154-166 x24 hours A1c 6.5 (10/2023)    NUTRITION - FOCUSED PHYSICAL EXAM:  Flowsheet Row Most Recent Value  Orbital Region Moderate depletion  Upper Arm Region Moderate depletion  Thoracic and Lumbar Region Moderate depletion  Buccal Region Mild depletion  Temple Region Moderate depletion  Clavicle Bone Region Moderate depletion  Clavicle and Acromion Bone Region Mild depletion  Scapular Bone Region Moderate depletion  Dorsal Hand No depletion  Patellar Region Moderate depletion  Anterior Thigh Region Severe depletion  Posterior Calf Region Severe depletion  Edema (RD Assessment) None  Hair Reviewed  Eyes Reviewed  Mouth Reviewed  [dentures on bottom only]  Skin Reviewed  Nails Reviewed    Diet Order:   Diet Order             Diet regular Room service appropriate? Yes; Fluid consistency: Thin  Diet effective now            EDUCATION NEEDS:  No education needs have been identified at this time  Skin:  Skin Assessment: Reviewed RN Assessment  Last BM:  6/16 - type 6/7 x2  Height:  Ht Readings from Last 1 Encounters:  12/01/23 5' 2 (1.575 m)  Weight:  Wt Readings from Last 1 Encounters:  12/07/23 53.8 kg    BMI:  Body mass index is 21.69 kg/m.  Estimated Nutritional Needs:   Kcal:  1500-1700  Protein:  75-90g  Fluid:  >/=1.5L  Con Decant MS, RD,  LDN Registered Dietitian Clinical Nutrition RD Inpatient Contact Info in Amion

## 2023-12-07 NOTE — Care Management Important Message (Signed)
 Important Message  Patient Details  Name: [name] MRN: [id number] Date of Birth: May 09, 1966   Important Message Given:  Yes - Medicare IM     Wynonia Hedges 12/07/2023, 10:24 AM

## 2023-12-07 NOTE — TOC CM/SW Note (Cosign Needed)
 RE: [name] Date of Birth: 03-24-1966 Date: 12/07/2023  Please be advised that the above-named patient will require a short-term nursing home stay - anticipated 30 days or less for rehabilitation and strengthening.  The plan is for return home.  Hershal Loron, MSW, LCSW-A Transitions of Care  Clinical Social Worker I [phone]

## 2023-12-07 NOTE — Progress Notes (Signed)
  Progress Note   Patient: [name] [id number]:[id number] DOB: 1965/11/19 DOA: 12/01/2023     6 DOS: the patient was seen and examined on 12/07/2023   Brief hospital course: [name] is a 58 y.o. female with medical history significant for COPD, seizure disorder, iron  deficiency anemia, depression/anxiety/schizophrenia, polysubstance use (EtOH, cocaine, tobacco), medical nonadherence and frequent ED visits who is admitted with severe sepsis due to right lower lobe pneumonia.  Assessment and Plan: RLL PNA with sepsis present on admission -acutely worsened on presentation, requiring intubation and transfer to ICU on 6/11. Later extubated 6/13 -completed 5 days of vanc/zosyn  as of 6/16 -Seems to be improved since admit -Leukocytosis normalized -Therapy recs for SNF noted. TOC following  ETOH abuse -continued on CIWA as needed -CIWA score remains minimal this AM  COPD -cont with current bronchodilators -wean steroids as tolerated  Hx seizures -stable thus far -cont AED's  FE deficiency anemia -Hemodynamically stable -recheck cbc in AM  Depression/Anxiety/Schizophrenia -seen by Psychiatry.  -recs for depakote  500mg  BID with ativan  PRN, cont abilify  10mg  every day, remeron at bedtime 7.5mg      Subjective: Complains of wanting more seasoning with her food  Physical Exam: Vitals:   12/07/23 1104 12/07/23 1200 12/07/23 1400 12/07/23 1442  BP: 114/75 118/70  119/71  Pulse: 96  96 95  Resp: 19  16 (!) 23  Temp: 97.8 F (36.6 C)     TempSrc: Oral     SpO2: 98% 93% 97% 99%  Weight:      Height:       General exam: Conversant, in no acute distress Respiratory system: normal chest rise, clear, no audible wheezing Cardiovascular system: regular rhythm, s1-s2 Gastrointestinal system: Nondistended, nontender, pos BS Central nervous system: No seizures, no tremors Extremities: No cyanosis, no joint deformities Skin: No rashes, no pallor Psychiatry: Affect normal // no  auditory hallucinations   Data Reviewed:  Labs reviewed: Na 136, K 3.1, Cr 0.47, WBC 5.9, hgb 7.4, Plts 105   Family Communication: Pt in room, family not at bedside  Disposition: Status is: Inpatient Remains inpatient appropriate because: Severity of illness  Planned Discharge Destination: Skilled nursing facility    Author: Cherylle Corwin, MD 12/07/2023 3:55 PM  For on call review [email or web address].

## 2023-12-08 ENCOUNTER — Inpatient Hospital Stay (HOSPITAL_COMMUNITY)

## 2023-12-08 DIAGNOSIS — E872 Acidosis, unspecified: Secondary | ICD-10-CM | POA: Diagnosis not present

## 2023-12-08 DIAGNOSIS — A419 Sepsis, unspecified organism: Secondary | ICD-10-CM | POA: Diagnosis not present

## 2023-12-08 DIAGNOSIS — E43 Unspecified severe protein-calorie malnutrition: Secondary | ICD-10-CM | POA: Insufficient documentation

## 2023-12-08 DIAGNOSIS — R6520 Severe sepsis without septic shock: Secondary | ICD-10-CM | POA: Diagnosis not present

## 2023-12-08 LAB — GLUCOSE, CAPILLARY
Glucose-Capillary: 147 mg/dL — ABNORMAL HIGH (ref 70–99)
Glucose-Capillary: 267 mg/dL — ABNORMAL HIGH (ref 70–99)
Glucose-Capillary: 216 mg/dL — ABNORMAL HIGH (ref 70–99)
Glucose-Capillary: 164 mg/dL — ABNORMAL HIGH (ref 70–99)
Glucose-Capillary: 172 mg/dL — ABNORMAL HIGH (ref 70–99)
Glucose-Capillary: 185 mg/dL — ABNORMAL HIGH (ref 70–99)

## 2023-12-08 LAB — TYPE AND SCREEN
ABO/RH(D): A POS
Antibody Screen: POSITIVE
Unit division: 0
Unit division: 0
Unit division: 0
Unit division: 0

## 2023-12-08 LAB — BPAM RBC
Blood Product Expiration Date: 202507062359
Blood Product Expiration Date: 202507062359
Blood Product Expiration Date: 202507092359
Blood Product Expiration Date: 202507102359
Unit Type and Rh: 6200
Unit Type and Rh: 6200
Unit Type and Rh: 6200
Unit Type and Rh: 6200

## 2023-12-08 LAB — PHOSPHORUS: Phosphorus: 3.5 mg/dL (ref 2.5–4.6)

## 2023-12-08 LAB — BASIC METABOLIC PANEL WITH GFR
Anion gap: 8 (ref 5–15)
BUN: 5 mg/dL — ABNORMAL LOW (ref 6–20)
CO2: 23 mmol/L (ref 22–32)
Calcium: 8.3 mg/dL — ABNORMAL LOW (ref 8.9–10.3)
Chloride: 107 mmol/L (ref 98–111)
Creatinine, Ser: 0.36 mg/dL — ABNORMAL LOW (ref 0.44–1.00)
GFR, Estimated: >60 mL/min (ref >60)
Glucose, Bld: 150 mg/dL — ABNORMAL HIGH (ref 70–99)
Potassium: 3.6 mmol/L (ref 3.5–5.1)
Sodium: 138 mmol/L (ref 135–145)

## 2023-12-08 LAB — CBC
HCT: 25.2 % — ABNORMAL LOW (ref 36.0–46.0)
Hemoglobin: 7 g/dL — ABNORMAL LOW (ref 12.0–15.0)
MCH: 21 pg — ABNORMAL LOW (ref 26.0–34.0)
MCHC: 27.8 g/dL — ABNORMAL LOW (ref 30.0–36.0)
MCV: 75.4 fL — ABNORMAL LOW (ref 80.0–100.0)
Platelets: 131 10*3/uL — ABNORMAL LOW (ref 150–400)
RBC: 3.34 MIL/uL — ABNORMAL LOW (ref 3.87–5.11)
RDW: 21.1 % — ABNORMAL HIGH (ref 11.5–15.5)
WBC: 5.7 10*3/uL (ref 4.0–10.5)
nRBC: 0.4 % — ABNORMAL HIGH (ref 0.0–0.2)

## 2023-12-08 LAB — BRAIN NATRIURETIC PEPTIDE: B Natriuretic Peptide: 135.1 pg/mL — ABNORMAL HIGH (ref 0.0–100.0)

## 2023-12-08 LAB — CULTURE, RESPIRATORY W GRAM STAIN: Culture: NO GROWTH

## 2023-12-08 LAB — PREPARE RBC (CROSSMATCH)

## 2023-12-08 LAB — PROCALCITONIN: Procalcitonin: 0.12 ng/mL

## 2023-12-08 LAB — C-REACTIVE PROTEIN: CRP: 1.3 mg/dL — ABNORMAL HIGH (ref <1.0)

## 2023-12-08 LAB — MAGNESIUM: Magnesium: 2 mg/dL (ref 1.7–2.4)

## 2023-12-08 MED ORDER — PANTOPRAZOLE SODIUM 40 MG PO TBEC
40.0000 mg | DELAYED_RELEASE_TABLET | Freq: Every day | ORAL | Status: DC
  Administered 2023-12-08 – 2023-12-12 (×5): 40 mg via ORAL
  Filled 2023-12-08 (×6): qty 1

## 2023-12-08 MED ORDER — SODIUM CHLORIDE 0.9% IV SOLUTION
Freq: Once | INTRAVENOUS | Status: DC
Start: 2023-12-08 — End: 2023-12-13

## 2023-12-08 MED ORDER — PREDNISONE 5 MG PO TABS: 10.0000 mg | ORAL_TABLET | Freq: Every day | ORAL | Status: DC

## 2023-12-08 NOTE — Plan of Care (Signed)
  Problem: Fluid Volume: Goal: Hemodynamic stability will improve Outcome: Progressing   Problem: Clinical Measurements: Goal: Diagnostic test results will improve Outcome: Progressing Goal: Signs and symptoms of infection will decrease Outcome: Progressing   Problem: Respiratory: Goal: Ability to maintain adequate ventilation will improve Outcome: Progressing   Problem: Education: Goal: Ability to describe self-care measures that may prevent or decrease complications (Diabetes Survival Skills Education) will improve Outcome: Progressing Goal: Individualized Educational Video(s) Outcome: Progressing   Problem: Coping: Goal: Ability to adjust to condition or change in health will improve Outcome: Progressing   Problem: Fluid Volume: Goal: Ability to maintain a balanced intake and output will improve Outcome: Progressing   Problem: Health Behavior/Discharge Planning: Goal: Ability to identify and utilize available resources and services will improve Outcome: Progressing Goal: Ability to manage health-related needs will improve Outcome: Progressing   Problem: Metabolic: Goal: Ability to maintain appropriate glucose levels will improve Outcome: Progressing   Problem: Nutritional: Goal: Maintenance of adequate nutrition will improve Outcome: Progressing Goal: Progress toward achieving an optimal weight will improve Outcome: Progressing   Problem: Skin Integrity: Goal: Risk for impaired skin integrity will decrease Outcome: Progressing   Problem: Tissue Perfusion: Goal: Adequacy of tissue perfusion will improve Outcome: Progressing   Problem: Education: Goal: Knowledge of General Education information will improve Description: Including pain rating scale, medication(s)/side effects and non-pharmacologic comfort measures Outcome: Progressing   Problem: Health Behavior/Discharge Planning: Goal: Ability to manage health-related needs will improve Outcome:  Progressing   Problem: Clinical Measurements: Goal: Ability to maintain clinical measurements within normal limits will improve Outcome: Progressing Goal: Will remain free from infection Outcome: Progressing Goal: Diagnostic test results will improve Outcome: Progressing Goal: Respiratory complications will improve Outcome: Progressing Goal: Cardiovascular complication will be avoided Outcome: Progressing   Problem: Activity: Goal: Risk for activity intolerance will decrease Outcome: Progressing   Problem: Nutrition: Goal: Adequate nutrition will be maintained Outcome: Progressing   Problem: Coping: Goal: Level of anxiety will decrease Outcome: Progressing   Problem: Elimination: Goal: Will not experience complications related to bowel motility Outcome: Progressing Goal: Will not experience complications related to urinary retention Outcome: Progressing   Problem: Pain Managment: Goal: General experience of comfort will improve and/or be controlled Outcome: Progressing   Problem: Safety: Goal: Ability to remain free from injury will improve Outcome: Progressing   Problem: Skin Integrity: Goal: Risk for impaired skin integrity will decrease Outcome: Progressing   Problem: Safety: Goal: Non-violent Restraint(s) Outcome: Progressing

## 2023-12-08 NOTE — Progress Notes (Signed)
 PROGRESS NOTE                                                                                                                                                                                                             Patient Demographics:    [name], is a 58 y.o. female, DOB - 04-01-1966, AVW:[id number]  Outpatient Primary MD for the patient is Patient, No Pcp Per    LOS - 7  Admit date - 12/01/2023    Chief Complaint  Patient presents with   RIB CAGE PAIN       Brief Narrative (HPI from H&P)   58 y.o. female with medical history significant for COPD, seizure disorder, iron  deficiency anemia, depression/anxiety/schizophrenia, polysubstance use (EtOH, cocaine, tobacco), medical nonadherence and frequent ED visits who is admitted with severe sepsis due to right lower lobe pneumonia.  She was initially intubated and kept in ICU, stabilized and transferred to my care on 12/08/2023 on day 7 of her hospital stay.   Significant Hospital Events: Including procedures, antibiotic start and stop dates in addition to other pertinent events   Multiple ER visits for various complaints noted, 20+ this year 6/11 intubated lined pressors bronch/BAL  6/12 weaning pressors, changing sedation 6/13 cortrak, hopeful extubation        Subjective:    [name] today has, No headache, No chest pain, No abdominal pain - No Nausea, No new weakness tingling or numbness, no SOB   Assessment  & Plan :    RLL PNA with sepsis present on admission  -acutely worsened on presentation, requiring intubation and transfer to ICU on 6/11. Later extubated 6/13 -completed 5 days of vanc/zosyn  as of 6/16 -Seems to be improved since admit -Leukocytosis normalized - Courage to sit in chair use I-S and flutter valve for pulmonary toiletry, advance activity and monitor.  He knows clinical signs of pneumonia.   ETOH abuse - No signs of DTs, on CIWA  protocol, extensively counseled to quit alcohol .   COPD -cont with current bronchodilators -wean steroids as tolerated   Hx seizures -stable thus far -cont AED's   FE deficiency anemia - Chronic with acute fall due to heme dilution and given blood draws, after consent 2 units of packed RBC of 12/08/2023.  Discussed with patient and her daughter.   Depression/Anxiety/Schizophrenia -seen by Psychiatry.  -  recs for depakote  500mg  BID with ativan  PRN, cont abilify  10mg  every day, remeron at bedtime 7.5mg         Condition - Extremely Guarded  Family Communication  : [name] [phone]  on 12/08/2023  Code Status :  Full  Consults  :  PCCM  PUD Prophylaxis :    Procedures  :            Disposition Plan  :    Status is: Inpatient   DVT Prophylaxis  :    enoxaparin  (LOVENOX ) injection 40 mg Start: 12/02/23 0200    Lab Results  Component Value Date   PLT 131 (L) 12/08/2023    Diet :  Diet Order             Diet regular Room service appropriate? Yes; Fluid consistency: Thin  Diet effective now                    Inpatient Medications  Scheduled Meds:  sodium chloride    Intravenous Once   arformoterol   15 mcg Nebulization BID   ARIPiprazole   10 mg Oral Daily   budesonide  (PULMICORT ) nebulizer solution  0.25 mg Nebulization BID   Chlorhexidine  Gluconate Cloth  6 each Topical Daily   divalproex   250 mg Oral Q12H   docusate sodium   100 mg Oral BID   enoxaparin  (LOVENOX ) injection  40 mg Subcutaneous Daily   folic acid   1 mg Oral Daily   guaiFENesin   20 mL Oral Q8H   insulin  aspart  0-15 Units Subcutaneous Q4H   levETIRAcetam   500 mg Oral BID   LORazepam   0-4 mg Intravenous Q6H   Followed by   Cecily Cohen ON 12/09/2023] LORazepam   0-4 mg Intravenous Q12H   mirtazapine  7.5 mg Oral QHS   multivitamin with minerals  1 tablet Oral Daily   mupirocin  ointment  1 Application Nasal BID   nicotine   21 mg Transdermal Daily   mouth rinse  15 mL Mouth  Rinse 4 times per day   polyethylene glycol  17 g Oral Daily   predniSONE   20 mg Oral Q breakfast   revefenacin   175 mcg Nebulization Daily   sodium chloride  flush  10-40 mL Intracatheter Q12H   sodium chloride  flush  3 mL Intravenous Q12H   thiamine   100 mg Per Tube Daily   Or   thiamine   100 mg Intravenous Daily   zonisamide   200 mg Oral Daily   Continuous Infusions: PRN Meds:.acetaminophen  **OR** acetaminophen , Gerhardt's butt cream, haloperidol  lactate, ipratropium-albuterol , loperamide , LORazepam  **OR** LORazepam , ondansetron  **OR** ondansetron  (ZOFRAN ) IV, mouth rinse, mouth rinse, senna-docusate, sodium chloride  flush    Objective:   Vitals:   12/08/23 0035 12/08/23 0200 12/08/23 0400 12/08/23 0436  BP: 101/60  (!) 94/59 (!) 97/59  Pulse: 99 91 92 97  Resp:   (!) 23 20  Temp:   97.7 F (36.5 C) 97.9 F (36.6 C)  TempSrc:   Oral   SpO2: 97% 100% 95% 98%  Weight:      Height:        Wt Readings from Last 3 Encounters:  12/07/23 53.8 kg  12/01/23 58 kg  11/30/23 58 kg     Intake/Output Summary (Last 24 hours) at 12/08/2023 0902 Last data filed at 12/07/2023 1800 Gross per 24 hour  Intake 480 ml  Output --  Net 480 ml     Physical Exam  Awake Alert, No new F.N deficits, Normal affect, left IJ central line  [place].AT,PERRAL Supple Neck, No JVD,   Symmetrical Chest wall movement, Good air movement bilaterally, CTAB RRR,No Gallops,Rubs or new Murmurs,  +ve B.Sounds, Abd Soft, No tenderness,   No Cyanosis, Clubbing or edema        Data Review:    Recent Labs  Lab 12/01/23 2104 12/02/23 0105 12/02/23 0646 12/02/23 0912 12/04/23 0403 12/05/23 0500 12/06/23 0400 12/07/23 0337 12/08/23 0343  WBC 15.5*   < > 6.9   < > 17.6* 6.3 6.2 5.9 5.7  HGB 9.8*   < > 8.5*   < > 7.8* 8.2* 7.7* 7.4* 7.0*  HCT 34.3*   < > 30.4*   < > 28.5* 28.6* 26.9* 25.6* 25.2*  PLT 176   < > 151   < > 111* 103* 96* 105* 131*  MCV 76.1*   < > 77.0*   < > 77.2* 75.7* 73.7* 74.9* 75.4*   MCH 21.7*   < > 21.5*   < > 21.1* 21.7* 21.1* 21.6* 21.0*  MCHC 28.6*   < > 28.0*   < > 27.4* 28.7* 28.6* 28.9* 27.8*  RDW 21.6*   < > 21.3*   < > 21.4* 21.3* 21.3* 21.2* 21.1*  LYMPHSABS 1.4  --  1.4  --   --   --   --   --   --   MONOABS 1.1*  --  0.7  --   --   --   --   --   --   EOSABS 0.0  --  0.1  --   --   --   --   --   --   BASOSABS 0.0  --  0.1  --   --   --   --   --   --    < > = values in this interval not displayed.    Recent Labs  Lab 12/01/23 2115 12/01/23 2130 12/02/23 0105 12/02/23 0110 12/02/23 0535 12/02/23 0646 12/02/23 0912 12/02/23 1058 12/02/23 1141 12/02/23 1143 12/02/23 1434 12/02/23 1635 12/02/23 1711 12/04/23 0403 12/04/23 2000 12/05/23 0500 12/06/23 0400 12/07/23 0337 12/08/23 0343  NA 135  --    < >  --    < > 136   < >  --   --   --   --   --    < > 142  --  140 136 136 138  K 4.8  --    < >  --    < > 4.2   < >  --   --   --   --   --    < > 3.2*  --  3.3* 3.5 3.1* 3.6  CL 101  --    < >  --   --  104  --   --   --   --   --   --    < > 105  --  102 99 101 107  CO2 20*  --    < >  --   --  24  --   --   --   --   --   --    < > 29  --  32 28 28 23   ANIONGAP 14  --    < >  --   --  8  --   --   --   --   --   --    < > 8  --  6 9 7  8  GLUCOSE 165*  --    < >  --   --  199*  --   --   --   --   --   --    < > 166*  --  182* 166* 154* 150*  BUN 6  --    < >  --   --  9  --   --   --   --   --   --    < > 8  --  10 9 7  5*  CREATININE 0.58  --    < >  --   --  0.67  --   --   --   --   --   --    < > 0.52  --  0.42* 0.38* 0.47 0.36*  AST 35  --   --   --   --  20  --   --   --   --   --   --   --   --   --   --   --   --   --   ALT 18  --   --   --   --  14  --   --   --   --   --   --   --   --   --   --   --   --   --   ALKPHOS 76  --   --   --   --  52  --   --   --   --   --   --   --   --   --   --   --   --   --   BILITOT 0.7  --   --   --   --  1.0  --   --   --   --   --   --   --   --   --   --   --   --   --   ALBUMIN  3.5  --   --    --   --  2.4*  --   --   --   --   --   --   --   --   --   --   --   --   --   PROCALCITON  --   --   --   --   --   --   --   --   --  4.45  --   --   --   --   --  0.87  --   --   --   LATICACIDVEN  --   --   --  3.2*  --   --   --  3.9* 3.7*  --  2.6* 2.3*  --   --   --   --   --   --   --   INR  --  0.9  --   --   --   --   --   --   --   --   --   --   --   --   --   --   --   --   --   BNP  --   --   --   --   --  79.0  --   --   --   --   --   --   --   --   --   --   --   --   --  MG  --   --   --   --   --   --   --   --   --   --   --   --    < > 2.3  --  2.1 1.9 2.0 2.0  PHOS  --   --   --   --   --   --   --   --   --   --   --   --    < > 1.5* 3.5 3.3 2.9 4.7* 3.5  CALCIUM  9.0  --    < >  --   --  7.8*  --   --   --   --   --   --    < > 8.5*  --  8.3* 8.7* 8.2* 8.3*   < > = values in this interval not displayed.      Recent Labs  Lab 12/01/23 2130 12/02/23 0105 12/02/23 0110 12/02/23 0646 12/02/23 1058 12/02/23 1141 12/02/23 1143 12/02/23 1434 12/02/23 1635 12/03/23 0436 12/04/23 0403 12/05/23 0500 12/06/23 0400 12/07/23 0337 12/08/23 0343  PROCALCITON  --   --   --   --   --   --  4.45  --   --   --   --  0.87  --   --   --   LATICACIDVEN  --   --  3.2*  --  3.9* 3.7*  --  2.6* 2.3*  --   --   --   --   --   --   INR 0.9  --   --   --   --   --   --   --   --   --   --   --   --   --   --   BNP  --   --   --  79.0  --   --   --   --   --   --   --   --   --   --   --   MG  --   --   --   --   --   --   --   --   --    < > 2.3 2.1 1.9 2.0 2.0  CALCIUM   --    < >  --  7.8*  --   --   --   --   --    < > 8.5* 8.3* 8.7* 8.2* 8.3*   < > = values in this interval not displayed.    --------------------------------------------------------------------------------------------------------------- Lab Results  Component Value Date   CHOL 141 11/05/2023   HDL 88 11/05/2023   LDLCALC 45 11/05/2023   TRIG 26 12/03/2023   CHOLHDL 1.6 11/05/2023    Lab Results   Component Value Date   HGBA1C 6.5 (H) 11/05/2023      Micro Results Recent Results (from the past 240 hours)  Resp panel by RT-PCR (RSV, Flu A&B, Covid) Anterior Nasal Swab     Status: None   Collection Time: 11/30/23  5:52 AM   Specimen: Anterior Nasal Swab  Result Value Ref Range Status   SARS Coronavirus 2 by RT PCR NEGATIVE NEGATIVE Final   Influenza A by PCR NEGATIVE NEGATIVE Final   Influenza B by PCR NEGATIVE NEGATIVE Final    Comment: (NOTE) The Xpert Xpress SARS-CoV-2/FLU/RSV plus assay is intended as an aid in the  diagnosis of influenza from Nasopharyngeal swab specimens and should not be used as a sole basis for treatment. Nasal washings and aspirates are unacceptable for Xpert Xpress SARS-CoV-2/FLU/RSV testing.  Fact Sheet for Patients: BloggerCourse.com  Fact Sheet for Healthcare Providers: SeriousBroker.it  This test is not yet approved or cleared by the [place]  FDA and has been authorized for detection and/or diagnosis of SARS-CoV-2 by FDA under an Emergency Use Authorization (EUA). This EUA will remain in effect (meaning this test can be used) for the duration of the COVID-19 declaration under Section 564(b)(1) of the Act, 21 U.S.C. section 360bbb-3(b)(1), unless the authorization is terminated or revoked.     Resp Syncytial Virus by PCR NEGATIVE NEGATIVE Final    Comment: (NOTE) Fact Sheet for Patients: BloggerCourse.com  Fact Sheet for Healthcare Providers: SeriousBroker.it  This test is not yet approved or cleared by the [place]  FDA and has been authorized for detection and/or diagnosis of SARS-CoV-2 by FDA under an Emergency Use Authorization (EUA). This EUA will remain in effect (meaning this test can be used) for the duration of the COVID-19 declaration under Section 564(b)(1) of the Act, 21 U.S.C. section 360bbb-3(b)(1), unless the  authorization is terminated or revoked.  Performed at [facility], [address] [place], [place] [address]   Culture, blood (Routine x 2)     Status: None   Collection Time: 12/01/23  9:20 PM   Specimen: BLOOD RIGHT FOREARM  Result Value Ref Range Status   Specimen Description BLOOD RIGHT FOREARM  Final   Special Requests   Final    BOTTLES DRAWN AEROBIC AND ANAEROBIC Blood Culture results may not be optimal due to an inadequate volume of blood received in culture bottles   Culture   Final    NO GROWTH 5 DAYS Performed at [facility], [address], [place], [place] [address]    Report Status 12/06/2023 FINAL  Final  Culture, blood (Routine x 2)     Status: None   Collection Time: 12/01/23  9:30 PM   Specimen: BLOOD RIGHT HAND  Result Value Ref Range Status   Specimen Description BLOOD RIGHT HAND  Final   Special Requests   Final    AEROBIC BOTTLE ONLY Blood Culture results may not be optimal due to an inadequate volume of blood received in culture bottles   Culture   Final    NO GROWTH 5 DAYS Performed at [facility], [address] [place], [place] [address]    Report Status 12/06/2023 FINAL  Final  Culture, Respiratory w Gram Stain     Status: None (Preliminary result)   Collection Time: 12/01/23 11:52 PM   Specimen: Tracheal Aspirate  Result Value Ref Range Status   Specimen Description TRACHEAL ASPIRATE  Final   Special Requests NONE  Final   Gram Stain   Final    FEW WBC PRESENT, PREDOMINANTLY PMN NO ORGANISMS SEEN    Culture   Final    FEW YEAST CULTURE REINCUBATED FOR BETTER GROWTH Performed at [facility], [address], [place], [place] [address]    Report Status PENDING  Incomplete  Culture, Respiratory w Gram Stain     Status: None   Collection Time: 12/02/23 11:36 AM   Specimen: Bronchoalveolar Lavage; Respiratory  Result Value Ref Range Status   Specimen Description BRONCHIAL ALVEOLAR LAVAGE  Final   Special Requests  NONE  Final   Gram Stain   Final  FEW WBC PRESENT, PREDOMINANTLY PMN NO ORGANISMS SEEN    Culture   Final    RARE CANDIDA DUBLINIENSIS RESULT CALLED TO, READ BACK BY AND VERIFIED WITH: RN [facility] [place] 12/05/2023 1233 BY Chestine Costain, MT Performed at [facility], [address], [place], [place] [address]    Report Status 12/05/2023 FINAL  Final  MRSA Next Gen by PCR, Nasal     Status: Abnormal   Collection Time: 12/02/23 12:14 PM   Specimen: Nasal Mucosa; Nasal Swab  Result Value Ref Range Status   MRSA by PCR Next Gen DETECTED (A) NOT DETECTED Final    Comment: RESULT CALLED TO, READ BACK BY AND VERIFIED WITH: RN [facility] ON 12/02/23 @ 1440 BY DRT (NOTE) The GeneXpert MRSA Assay (FDA approved for NASAL specimens only), is one component of a comprehensive MRSA colonization surveillance program. It is not intended to diagnose MRSA infection nor to guide or monitor treatment for MRSA infections. Test performance is not FDA approved in patients less than 41 years old. Performed at [facility], [address], [place], [place] [address]     Radiology Report DG Chest [place] 1 View Result Date: 12/08/2023 CLINICAL DATA:  58 year old female shortness of breath and back pain. Sepsis, right lung base pneumonia. EXAM: PORTABLE CHEST 1 VIEW COMPARISON:  Portable chest 12/04/2023 and earlier. FINDINGS: Portable AP semi upright view at 0626 hours. Extubated and enteric tube removed. Stable to mildly improved lung volumes from 4 days ago. Left IJ approach central line is stable. Stable cardiac size and mediastinal contours. Ongoing dense right lung base opacity, partially veiling. No other confluent lung opacity. No pneumothorax. No pulmonary edema. Visualized tracheal air column is within normal limits. Visible bowel-gas pattern within normal limits. Stable visualized osseous structures. IMPRESSION: 1. Extubated and enteric tube removed with mildly improved lung volumes. Stable left IJ  central line. 2. Continued dense right lung base opacity, favor a combination of pleural fluid and unresolved consolidation. 3. No new cardiopulmonary abnormality. Electronically Signed   By: Marlise Simpers M.D.   On: 12/08/2023 06:38     Signature  -   Lynnwood Sauer M.D on 12/08/2023 at 9:02 AM   -  To page go to [email or web address]

## 2023-12-08 NOTE — Progress Notes (Signed)
 Secure chat sent to RN to confirm patient is back in bed. Theophilus Fitz, RN VAST

## 2023-12-08 NOTE — Progress Notes (Signed)
 VAST PIV consult. Arrived to patient room. Pt up to [facility]. Theophilus Fitz, RN VAST

## 2023-12-08 NOTE — Progress Notes (Signed)
 Physical Therapy Treatment Patient Details Name: [name] MRN: [id number] DOB: 06-16-1966 Today's Date: 12/08/2023   History of Present Illness Pt is 58 year old presented to [facility] on  12/01/23 for SOB, RUQ and Rt ches pain. Pt with acute hypoxic resp failure with sepsis due to PNA. Intubated 6/11 and extubated 6/13. Pt also with acute metabolic encephalopathy. PMH - alcohol  use disorder, polysubstance use, a-fib not on anticoagulation, COPD, seizure disorder, IDA, B12 deficiency, hypothyroidism, schizophrenia.    PT Comments  Pt admitted with above diagnosis. Pt was able to ambulate in hallways and incr distance today. STill needs min assist at times when challenged however when not challenged, maintains balance. May need to use device at d/c.Continue to recommend post acute rehab < 3 hours day.  Will continue to follow acutely.  Pt currently with functional limitations due to the deficits listed below (see PT Problem List). Pt will benefit from acute skilled PT to increase their independence and safety with mobility to allow discharge.       If plan is discharge home, recommend the following: Assistance with cooking/housework;Supervision due to cognitive status;Direct supervision/assist for financial management;Direct supervision/assist for medications management   Can travel by private vehicle     Yes  Equipment Recommendations  Other (comment) (To be determined)    Recommendations for Other Services       Precautions / Restrictions Precautions Precautions: Fall Recall of Precautions/Restrictions: Impaired Restrictions Weight Bearing Restrictions Per Provider Order: No     Mobility  Bed Mobility Overal bed mobility: Needs Assistance Bed Mobility: Supine to Sit, Sit to Supine     Supine to sit: HOB elevated, Supervision Sit to supine: Supervision        Transfers Overall transfer level: Needs assistance Equipment used: None Transfers: Sit to/from Stand, Bed to  chair/wheelchair/BSC Sit to Stand: Contact guard assist                Ambulation/Gait Ambulation/Gait assistance: Min assist Gait Distance (Feet): 400 Feet Assistive device: 1 person hand held assist, None Gait Pattern/deviations: Step-through pattern, Decreased step length - right, Decreased step length - left, Shuffle, Drifts right/left Gait velocity: decr Gait velocity interpretation: <1.31 ft/sec, indicative of household ambulator   General Gait Details: Pt progressing with ambulation.  No significant LOB in controlled environement.  When challenges given, needed some assist at times for balance.   Stairs             Wheelchair Mobility     Tilt Bed    Modified Rankin (Stroke Patients Only)       Balance Overall balance assessment: Needs assistance Sitting-balance support: No upper extremity supported, Feet supported Sitting balance-Leahy Scale: Good     Standing balance support: No upper extremity supported, During functional activity Standing balance-Leahy Scale: Fair Standing balance comment: can stand staticlly without support             High level balance activites: Backward walking, Direction changes, Sudden stops, Head turns, Turns High Level Balance Comments: Needed min assist with challenges            Communication Communication Communication: No apparent difficulties  Cognition Arousal: Alert Behavior During Therapy: Flat affect   PT - Cognitive impairments: Orientation, Awareness, Memory, Problem solving, Safety/Judgement   Orientation impairments: Situation                     Following commands: Intact Following commands impaired: Only follows one step commands consistently  Cueing Cueing Techniques: Verbal cues, Tactile cues  Exercises      General Comments General comments (skin integrity, edema, etc.): VSS on RA      Pertinent Vitals/Pain Pain Assessment Pain Assessment: No/denies pain Faces Pain  Scale: No hurt    Home Living                          Prior Function            PT Goals (current goals can now be found in the care plan section) Acute Rehab PT Goals Patient Stated Goal: Pt did not state Progress towards PT goals: Progressing toward goals    Frequency    Min 2X/week      PT Plan      Co-evaluation              AM-PAC PT 6 Clicks Mobility   Outcome Measure  Help needed turning from your back to your side while in a flat bed without using bedrails?: None Help needed moving from lying on your back to sitting on the side of a flat bed without using bedrails?: A Little Help needed moving to and from a bed to a chair (including a wheelchair)?: A Little Help needed standing up from a chair using your arms (e.g., wheelchair or bedside chair)?: A Little Help needed to walk in hospital room?: A Little Help needed climbing 3-5 steps with a railing? : Total 6 Click Score: 17    End of Session Equipment Utilized During Treatment: Gait belt Activity Tolerance: Patient tolerated treatment well Patient left: in bed;with call bell/phone within reach;with bed alarm set Nurse Communication: Mobility status PT Visit Diagnosis: Unsteadiness on feet (R26.81);Other abnormalities of gait and mobility (R26.89);Muscle weakness (generalized) (M62.81)     Time: 1914-7829 PT Time Calculation (min) (ACUTE ONLY): 19 min  Charges:    $Gait Training: 8-22 mins PT General Charges $$ ACUTE PT VISIT: 1 Visit                     Darnice Comrie M,PT Acute Rehab Services [phone]    Florencia Hunter 12/08/2023, 1:10 PM

## 2023-12-08 NOTE — Plan of Care (Signed)
  Problem: Fluid Volume: Goal: Hemodynamic stability will improve Outcome: Progressing   Problem: Clinical Measurements: Goal: Diagnostic test results will improve Outcome: Progressing Goal: Signs and symptoms of infection will decrease Outcome: Progressing   Problem: Respiratory: Goal: Ability to maintain adequate ventilation will improve Outcome: Progressing   Problem: Health Behavior/Discharge Planning: Goal: Ability to manage health-related needs will improve Outcome: Progressing   Problem: Metabolic: Goal: Ability to maintain appropriate glucose levels will improve Outcome: Progressing   Problem: Nutritional: Goal: Maintenance of adequate nutrition will improve Outcome: Progressing   Problem: Skin Integrity: Goal: Risk for impaired skin integrity will decrease Outcome: Progressing   Problem: Tissue Perfusion: Goal: Adequacy of tissue perfusion will improve Outcome: Progressing   Problem: Education: Goal: Knowledge of General Education information will improve Description: Including pain rating scale, medication(s)/side effects and non-pharmacologic comfort measures Outcome: Progressing   Problem: Health Behavior/Discharge Planning: Goal: Ability to manage health-related needs will improve Outcome: Progressing   Problem: Clinical Measurements: Goal: Ability to maintain clinical measurements within normal limits will improve Outcome: Progressing Goal: Will remain free from infection Outcome: Progressing Goal: Diagnostic test results will improve Outcome: Progressing Goal: Respiratory complications will improve Outcome: Progressing Goal: Cardiovascular complication will be avoided Outcome: Progressing   Problem: Nutrition: Goal: Adequate nutrition will be maintained Outcome: Progressing   Problem: Coping: Goal: Level of anxiety will decrease Outcome: Progressing   Problem: Elimination: Goal: Will not experience complications related to bowel  motility Outcome: Progressing

## 2023-12-09 ENCOUNTER — Inpatient Hospital Stay (HOSPITAL_COMMUNITY)

## 2023-12-09 DIAGNOSIS — A419 Sepsis, unspecified organism: Secondary | ICD-10-CM | POA: Diagnosis not present

## 2023-12-09 DIAGNOSIS — E872 Acidosis, unspecified: Secondary | ICD-10-CM | POA: Diagnosis not present

## 2023-12-09 DIAGNOSIS — R6520 Severe sepsis without septic shock: Secondary | ICD-10-CM | POA: Diagnosis not present

## 2023-12-09 LAB — BASIC METABOLIC PANEL WITH GFR
Anion gap: 10 (ref 5–15)
BUN: <5 mg/dL — ABNORMAL LOW (ref 6–20)
CO2: 21 mmol/L — ABNORMAL LOW (ref 22–32)
Calcium: 8.3 mg/dL — ABNORMAL LOW (ref 8.9–10.3)
Chloride: 107 mmol/L (ref 98–111)
Creatinine, Ser: 0.47 mg/dL (ref 0.44–1.00)
GFR, Estimated: >60 mL/min (ref >60)
Glucose, Bld: 209 mg/dL — ABNORMAL HIGH (ref 70–99)
Potassium: 3.9 mmol/L (ref 3.5–5.1)
Sodium: 138 mmol/L (ref 135–145)

## 2023-12-09 LAB — MAGNESIUM: Magnesium: 2.2 mg/dL (ref 1.7–2.4)

## 2023-12-09 LAB — CBC WITH DIFFERENTIAL/PLATELET
Abs Immature Granulocytes: 0.02 10*3/uL (ref 0.00–0.07)
Basophils Absolute: 0 10*3/uL (ref 0.0–0.1)
Basophils Relative: 1 %
Eosinophils Absolute: 0.1 10*3/uL (ref 0.0–0.5)
Eosinophils Relative: 1 %
HCT: 31.6 % — ABNORMAL LOW (ref 36.0–46.0)
Hemoglobin: 9.2 g/dL — ABNORMAL LOW (ref 12.0–15.0)
Immature Granulocytes: 1 %
Lymphocytes Relative: 33 %
Lymphs Abs: 1.4 10*3/uL (ref 0.7–4.0)
MCH: 22 pg — ABNORMAL LOW (ref 26.0–34.0)
MCHC: 29.1 g/dL — ABNORMAL LOW (ref 30.0–36.0)
MCV: 75.6 fL — ABNORMAL LOW (ref 80.0–100.0)
Monocytes Absolute: 0.5 10*3/uL (ref 0.1–1.0)
Monocytes Relative: 11 %
Neutro Abs: 2.3 10*3/uL (ref 1.7–7.7)
Neutrophils Relative %: 53 %
Platelets: 201 10*3/uL (ref 150–400)
RBC: 4.18 MIL/uL (ref 3.87–5.11)
RDW: 20.6 % — ABNORMAL HIGH (ref 11.5–15.5)
WBC: 4.2 10*3/uL (ref 4.0–10.5)
nRBC: 0 % (ref 0.0–0.2)

## 2023-12-09 LAB — C-REACTIVE PROTEIN: CRP: 0.8 mg/dL (ref <1.0)

## 2023-12-09 LAB — GLUCOSE, CAPILLARY
Glucose-Capillary: 177 mg/dL — ABNORMAL HIGH (ref 70–99)
Glucose-Capillary: 190 mg/dL — ABNORMAL HIGH (ref 70–99)
Glucose-Capillary: 218 mg/dL — ABNORMAL HIGH (ref 70–99)
Glucose-Capillary: 253 mg/dL — ABNORMAL HIGH (ref 70–99)
Glucose-Capillary: 270 mg/dL — ABNORMAL HIGH (ref 70–99)
Glucose-Capillary: 287 mg/dL — ABNORMAL HIGH (ref 70–99)
Glucose-Capillary: 301 mg/dL — ABNORMAL HIGH (ref 70–99)

## 2023-12-09 LAB — PROCALCITONIN: Procalcitonin: <0.1 ng/mL

## 2023-12-09 LAB — BRAIN NATRIURETIC PEPTIDE: B Natriuretic Peptide: 33.4 pg/mL (ref 0.0–100.0)

## 2023-12-09 MED ORDER — PREDNISONE 20 MG PO TABS
20.0000 mg | ORAL_TABLET | Freq: Every day | ORAL | Status: DC
  Administered 2023-12-09: 20 mg via ORAL
  Filled 2023-12-09: qty 1

## 2023-12-09 MED ORDER — LACTATED RINGERS IV BOLUS
500.0000 mL | Freq: Once | INTRAVENOUS | Status: AC
  Administered 2023-12-09: 500 mL via INTRAVENOUS

## 2023-12-09 MED ORDER — QUETIAPINE FUMARATE 25 MG PO TABS
25.0000 mg | ORAL_TABLET | Freq: Two times a day (BID) | ORAL | Status: DC
  Administered 2023-12-09 – 2023-12-12 (×8): 25 mg via ORAL
  Filled 2023-12-09 (×9): qty 1

## 2023-12-09 MED ORDER — HALOPERIDOL LACTATE 5 MG/ML IJ SOLN
2.0000 mg | Freq: Four times a day (QID) | INTRAMUSCULAR | Status: DC | PRN
  Administered 2023-12-10 – 2023-12-11 (×2): 2 mg via INTRAMUSCULAR
  Filled 2023-12-09 (×2): qty 1

## 2023-12-09 MED ORDER — LACTATED RINGERS IV SOLN: INTRAVENOUS | Status: DC

## 2023-12-09 MED ORDER — DILTIAZEM HCL 25 MG/5ML IV SOLN: 10.0000 mg | Freq: Four times a day (QID) | INTRAVENOUS | Status: DC | PRN

## 2023-12-09 MED ORDER — THIAMINE MONONITRATE 100 MG PO TABS
100.0000 mg | ORAL_TABLET | Freq: Every day | ORAL | Status: DC
  Administered 2023-12-09 – 2023-12-12 (×4): 100 mg via ORAL
  Filled 2023-12-09 (×5): qty 1

## 2023-12-09 MED ORDER — THIAMINE HCL 100 MG/ML IJ SOLN: 100.0000 mg | Freq: Every day | INTRAMUSCULAR | Status: DC

## 2023-12-09 NOTE — Progress Notes (Signed)
 Occupational Therapy Treatment Patient Details Name: KALEISHA BHARGAVA MRN: [id number] DOB: 25-Feb-1966 Today's Date: 12/09/2023   History of present illness Pt is 58 year old presented to [facility] on  12/01/23 for SOB, RUQ and R chest pain. Pt with acute hypoxic resp failure with sepsis due to PNA. Intubated 6/11 and extubated 6/13. Pt also with acute metabolic encephalopathy. PMH - alcohol  use disorder, polysubstance use, a-fib not on anticoagulation, COPD, seizure disorder, IDA, B12 deficiency, hypothyroidism, schizophrenia.   OT comments  Pt making progress with functional goals, requires some cues for safety during ADL mobility dur to impulsivity. Pt's HR up to 118 during in room activity, O2 SATs 97-99%. OT will continue to follow acutely to maximize level of function and safety      If plan is discharge home, recommend the following:  A little help with walking and/or transfers;A lot of help with bathing/dressing/bathroom;Assistance with cooking/housework;Direct supervision/assist for medications management;Direct supervision/assist for financial management;Assist for transportation;Help with stairs or ramp for entrance   Equipment Recommendations  Tub/shower seat    Recommendations for Other Services      Precautions / Restrictions Precautions Precautions: Fall Recall of Precautions/Restrictions: Impaired Precaution/Restrictions Comments: poor awareness of lines/leads and safety Restrictions Weight Bearing Restrictions Per Provider Order: No       Mobility Bed Mobility               General bed mobility comments: pt seated in chair    Transfers Overall transfer level: Needs assistance Equipment used: Straight cane Transfers: Sit to/from Stand Sit to Stand: Supervision, Contact guard assist     Step pivot transfers: Contact guard assist     General transfer comment: held on to IV pole during in room ADL mobility     Balance Overall balance assessment: Needs  assistance Sitting-balance support: No upper extremity supported, Feet supported Sitting balance-Leahy Scale: Good     Standing balance support: No upper extremity supported, During functional activity Standing balance-Leahy Scale: Poor                             ADL either performed or assessed with clinical judgement   ADL Overall ADL's : Needs assistance/impaired     Grooming: Wash/dry hands;Wash/dry face;Supervision/safety;Standing   Upper Body Bathing: Set up   Lower Body Bathing: Contact guard assist   Upper Body Dressing : Set up   Lower Body Dressing: Contact guard assist   Toilet Transfer: Contact guard assist;Supervision/safety;Ambulation;Regular Toilet;Grab bars;Cueing for safety   Toileting- Clothing Manipulation and Hygiene: Supervision/safety;Sit to/from stand   Tub/ Shower Transfer: Contact guard assist;Ambulation;Grab bars;Cueing for safety   Functional mobility during ADLs: Contact guard assist;Supervision/safety;Cueing for safety      Extremity/Trunk Assessment Upper Extremity Assessment Upper Extremity Assessment: Generalized weakness   Lower Extremity Assessment Lower Extremity Assessment: Defer to PT evaluation   Cervical / Trunk Assessment Cervical / Trunk Assessment: Normal    Vision Ability to See in Adequate Light: 0 Adequate Patient Visual Report: No change from baseline     Perception     Praxis     Communication Communication Communication: No apparent difficulties   Cognition Arousal: Alert Behavior During Therapy: Restless, Impulsive, Anxious                                 Following commands: Intact Following commands impaired: Follows one step commands inconsistently  Cueing   Cueing Techniques: Verbal cues, Tactile cues, Gestural cues  Exercises      Shoulder Instructions       General Comments HR tachy to ~130 bpm with exertion, improves to ~118 bpm sitting in chair. SpO2 93-96% on  RA post-exertion. BP 121/78 (92) standing at bedside initially when she c/o nausea.    Pertinent Vitals/ Pain       Pain Assessment Pain Assessment: No/denies pain Faces Pain Scale: No hurt Pain Intervention(s): Monitored during session, Repositioned  Home Living                                          Prior Functioning/Environment              Frequency  Min 2X/week        Progress Toward Goals  OT Goals(current goals can now be found in the care plan section)  Progress towards OT goals: Progressing toward goals  Acute Rehab OT Goals Patient Stated Goal: go home  Plan      Co-evaluation                 AM-PAC OT 6 Clicks Daily Activity     Outcome Measure   Help from another person eating meals?: None Help from another person taking care of personal grooming?: A Little Help from another person toileting, which includes using toliet, bedpan, or urinal?: A Little Help from another person bathing (including washing, rinsing, drying)?: A Little Help from another person to put on and taking off regular upper body clothing?: A Little Help from another person to put on and taking off regular lower body clothing?: A Little 6 Click Score: 19    End of Session Equipment Utilized During Treatment: Gait belt;Oxygen   OT Visit Diagnosis: Unsteadiness on feet (R26.81);Other abnormalities of gait and mobility (R26.89);Muscle weakness (generalized) (M62.81)   Activity Tolerance Patient tolerated treatment well   Patient Left in chair;with call bell/phone within reach;with chair alarm set   Nurse Communication Mobility status        Time: 1610-9604 OT Time Calculation (min): 26 min  Charges: OT General Charges $OT Visit: 1 Visit OT Treatments $Self Care/Home Management : 8-22 mins $Therapeutic Activity: 8-22 mins   Alfred Ann 12/09/2023, 1:31 PM

## 2023-12-09 NOTE — Plan of Care (Signed)
  Problem: Fluid Volume: Goal: Hemodynamic stability will improve Outcome: Not Progressing   Problem: Clinical Measurements: Goal: Diagnostic test results will improve Outcome: Not Progressing Goal: Signs and symptoms of infection will decrease Outcome: Not Progressing   Problem: Respiratory: Goal: Ability to maintain adequate ventilation will improve Outcome: Not Progressing   Problem: Education: Goal: Ability to describe self-care measures that may prevent or decrease complications (Diabetes Survival Skills Education) will improve Outcome: Not Progressing Goal: Individualized Educational Video(s) Outcome: Not Progressing   Problem: Fluid Volume: Goal: Ability to maintain a balanced intake and output will improve Outcome: Not Progressing   Problem: Health Behavior/Discharge Planning: Goal: Ability to identify and utilize available resources and services will improve Outcome: Not Progressing Goal: Ability to manage health-related needs will improve Outcome: Not Progressing

## 2023-12-09 NOTE — Progress Notes (Addendum)
   12/09/23 1424  Mobility  Activity Stood at bedside (x5)  Level of Assistance Standby assist, set-up cues, supervision of patient - no hands on  Assistive Device None  Range of Motion/Exercises Active;All extremities  Activity Response Tolerated fair  Mobility Referral Yes  Mobility visit 1 Mobility  Mobility Specialist Start Time (ACUTE ONLY) 1424  Mobility Specialist Stop Time (ACUTE ONLY) 1440  Mobility Specialist Time Calculation (min) (ACUTE ONLY) 16 min   Mobility Specialist: Progress Note  Pre-Mobility:      HR 116, SpO2 96% 2L During Mobility: HR 128-130, SpO2 96% 2L Post-Mobility:    HR 117, SpO2 97% 2L  Pt agreeable to mobility session - received in bed. C/o feeling tired, further mobility deffered d/t high HR. Returned to bed with all needs met - call bell within reach. Sitter present.  Exercises: STS x5, Kicks x10, Marches, x10, Bicep curls x10, Chest Press x10, Shoulder Flexion x10.  [name], BS Mobility Specialist Please contact via SecureChat or  Rehab office at [phone].

## 2023-12-09 NOTE — Progress Notes (Signed)
 Physical Therapy Treatment Patient Details Name: [name] MRN: [id number] DOB: 1966-03-13 Today's Date: 12/09/2023   History of Present Illness Pt is 58 year old presented to [facility] on  12/01/23 for SOB, RUQ and R chest pain. Pt with acute hypoxic resp failure with sepsis due to PNA. Intubated 6/11 and extubated 6/13. Pt also with acute metabolic encephalopathy. PMH - alcohol  use disorder, polysubstance use, a-fib not on anticoagulation, COPD, seizure disorder, IDA, B12 deficiency, hypothyroidism, schizophrenia.    PT Comments  Pt received in supine, agreeable to therapy session and eager to get OOB and participate in gait/transfer training. Pt needing up to CGA for stair ascent/descent with single rail and cane in opposite hand, and Supervision for safety for gait with use of cane, although also needing mod safety and directional cues and pt with poor awareness of IV/lines needing frequent reminders. Tachy to ~130 bpm with household distance gait trial and pt c/o nausea but reluctant to remain resting in chair. RN notified pt IV beeping and that her landline phone in room is broken Training and development officer to order) and recommend staff use cane to assist her with mobilizing in room/hallway due to mild instability and pt poor safety awareness.    If plan is discharge home, recommend the following: Assistance with cooking/housework;Supervision due to cognitive status;Direct supervision/assist for financial management;Direct supervision/assist for medications management;A little help with walking and/or transfers;Help with stairs or ramp for entrance   Can travel by private vehicle     Yes  Equipment Recommendations  Other (comment);None recommended by PT (recommend pt use cane she has at home)    Recommendations for Other Services       Precautions / Restrictions Precautions Precautions: Fall Recall of Precautions/Restrictions: Impaired Precaution/Restrictions Comments: poor awareness of lines/leads  and safety Restrictions Weight Bearing Restrictions Per Provider Order: No     Mobility  Bed Mobility Overal bed mobility: Needs Assistance Bed Mobility: Supine to Sit     Supine to sit: Supervision     General bed mobility comments: cues for line awareness/safety    Transfers Overall transfer level: Needs assistance Equipment used: None, Straight cane Transfers: Sit to/from Stand Sit to Stand: Supervision, Contact guard assist           General transfer comment: cues to reach back prior to sitting rather than plopping down.    Ambulation/Gait Ambulation/Gait assistance: Supervision Gait Distance (Feet): 125 Feet Assistive device: Straight cane Gait Pattern/deviations: Step-through pattern, Decreased step length - right, Decreased step length - left, Shuffle, Drifts right/left Gait velocity: decr     General Gait Details: no LOB overall and steadier using cane, but pt with poor insight into deficits and states I have one but I won't use it at home. PTA reinforces that she has been unsteady and should use AD at home initially for fall prevention and energy conservation. Tachy to 130 bpm with exertion so returned to room for pt safety. SpO2 93% on RA once seated EOB and RR elevated >20 rpm post-exertion.  Pt able to step closer to middle of hallway when cued but tends to drift to L side of hallway.   Stairs Stairs: Yes Stairs assistance: Contact guard assist Stair Management: One rail Right, Step to pattern, Forwards, With cane Number of Stairs: 1 (platform step x2 reps) General stair comments: single 7 step up/down next to R wall rail with pt performing upon entering hall then second rep when returning to her room (x2 total reps). Decreased carryover of cues after  initial trial and pt unsteady descending with need for cane to catch her balance and prevent forward LOB.   Wheelchair Mobility     Tilt Bed    Modified Rankin (Stroke Patients Only)        Balance Overall balance assessment: Needs assistance Sitting-balance support: No upper extremity supported, Feet supported Sitting balance-Leahy Scale: Good Sitting balance - Comments: sitting EOB, c/o mild nausea but agreeable to progress OOB   Standing balance support: No upper extremity supported, During functional activity, Reliant on assistive device for balance Standing balance-Leahy Scale: Poor Standing balance comment: poor dynamic standing balance unsupported, fair with cane                            Communication Communication Communication: No apparent difficulties  Cognition Arousal: Alert Behavior During Therapy: Restless, Impulsive, Anxious   PT - Cognitive impairments: Orientation, Awareness, Memory, Problem solving, Safety/Judgement, Attention, Sequencing   Orientation impairments: Situation                   PT - Cognition Comments: Pt impulsive and often with decreased carryover of instruction or plan after 1-2 minutes for safety/call bell use and sequencing with cane. Pt states I won't use this at home re: cane Following commands: Intact Following commands impaired: Follows one step commands inconsistently    Cueing Cueing Techniques: Verbal cues, Tactile cues, Gestural cues  Exercises      General Comments General comments (skin integrity, edema, etc.): HR tachy to ~130 bpm with exertion, improves to ~118 bpm sitting in chair. SpO2 93-96% on RA post-exertion. BP 121/78 (92) standing at bedside initially when she c/o nausea.      Pertinent Vitals/Pain Pain Assessment Pain Assessment: Faces Faces Pain Scale: Hurts little more Pain Location: bottom from sitting in the chair Pain Descriptors / Indicators: Discomfort, Grimacing, Guarding Pain Intervention(s): Limited activity within patient's tolerance, Monitored during session, Repositioned (x2 pillows placed behind her back for pt comfort, pt may need additional pillow under her in the  chair)    Home Living                          Prior Function            PT Goals (current goals can now be found in the care plan section) Acute Rehab PT Goals Patient Stated Goal: To walk and move around more PT Goal Formulation: With patient Time For Goal Achievement: 12/19/23 Progress towards PT goals: Progressing toward goals    Frequency    Min 2X/week      PT Plan      Co-evaluation              AM-PAC PT 6 Clicks Mobility   Outcome Measure  Help needed turning from your back to your side while in a flat bed without using bedrails?: None Help needed moving from lying on your back to sitting on the side of a flat bed without using bedrails?: A Little Help needed moving to and from a bed to a chair (including a wheelchair)?: A Little Help needed standing up from a chair using your arms (e.g., wheelchair or bedside chair)?: A Little Help needed to walk in hospital room?: A Little Help needed climbing 3-5 steps with a railing? : A Little 6 Click Score: 19    End of Session Equipment Utilized During Treatment: Gait belt Activity Tolerance: Patient tolerated  treatment well;Other (comment);Treatment limited secondary to medical complications (Comment) (tachycardia and elevated RR with exertion limiting gait distance for pt safety) Patient left: with call bell/phone within reach;in chair;with chair alarm set;Other (comment) (pt requesting to leave her feet down) Nurse Communication: Mobility status;Other (comment) (pt IV beeping infusion complete pt needs a new phone for her room Training and development officer notified hers is not working), tachycardia) PT Visit Diagnosis: Unsteadiness on feet (R26.81);Other abnormalities of gait and mobility (R26.89);Muscle weakness (generalized) (M62.81)     Time: 8295-6213 PT Time Calculation (min) (ACUTE ONLY): 18 min  Charges:    $Gait Training: 8-22 mins PT General Charges $$ ACUTE PT VISIT: 1 Visit                      Victora Irby P., PTA Acute Rehabilitation Services Secure Chat Preferred 9a-5:30pm Office: [phone]    Mariel Shope [facility] 12/09/2023, 11:09 AM

## 2023-12-09 NOTE — Progress Notes (Signed)
 Patient has orders for a safety sitter - there is no sitter being provided by the hospital for this shift.

## 2023-12-09 NOTE — Progress Notes (Signed)
Patient in yellow mews at start of shift

## 2023-12-09 NOTE — Progress Notes (Signed)
 At bedside for PIV insertion/remove CL. Assessed bilateral extremities with and without U/S. No suitable vessels identified. Very poor vasculature. Brachial vessels over 2 cm in depth. No cephalic vessels noted. Recommend leaving the CL in place for vascular needs at this time.

## 2023-12-09 NOTE — Progress Notes (Signed)
 PROGRESS NOTE                                                                                                                                                                                                             Patient Demographics:    [name], is a 58 y.o. female, DOB - [birth year], GNF:[id number]  Outpatient Primary MD for the patient is Patient, No Pcp Per    LOS - 8  Admit date - 12/01/2023    Chief Complaint  Patient presents with   RIB CAGE PAIN       Brief Narrative (HPI from H&P)   58 y.o. female with medical history significant for COPD, seizure disorder, iron  deficiency anemia, depression/anxiety/schizophrenia, polysubstance use (EtOH, cocaine, tobacco), medical nonadherence and frequent ED visits who is admitted with severe sepsis due to right lower lobe pneumonia.  She was initially intubated and kept in ICU, stabilized and transferred to my care on 12/08/2023 on day 7 of her hospital stay.   Significant Hospital Events: Including procedures, antibiotic start and stop dates in addition to other pertinent events   Multiple ER visits for various complaints noted, 20+ this year 6/11 intubated lined pressors bronch/BAL  6/12 weaning pressors, changing sedation 6/13 cortrak, hopeful extubation        Subjective:   Patient in bed, appears comfortable, denies any headache, no fever, no chest pain or pressure, no shortness of breath , no abdominal pain. No new focal weakness.   Assessment  & Plan :    RLL PNA with sepsis present on admission  -acutely worsened on presentation, requiring intubation and transfer to ICU on 6/11. Later extubated 6/13 -completed 5 days of vanc/zosyn  as of 6/16 -Seems to be improved since admit -Leukocytosis normalized - Courage to sit in chair use I-S and flutter valve for pulmonary toiletry, advance activity and monitor.  He knows clinical signs of pneumonia.   ETOH  abuse - Mild DTs, on CIWA protocol, add low-dose Seroquel  and monitor.   Hypotension.  Morning of 12/09/2023.  Hydrate & monitor.    COPD -cont with current bronchodilators -wean steroids as tolerated   Hx seizures -stable thus far -cont AED's   FE deficiency anemia - Chronic with acute fall due to heme dilution and given blood draws, after consent 2 units of packed RBC of 12/08/2023.  Discussed with patient and her daughter.   Depression/Anxiety/Schizophrenia -seen by Psychiatry.  -recs for depakote  500mg  BID with ativan  PRN, cont abilify  10mg  every day, remeron at bedtime 7.5mg         Condition - Extremely Guarded  Family Communication  : [name] [phone]  on 12/08/2023  Code Status :  Full  Consults  :  PCCM  PUD Prophylaxis :    Procedures  :            Disposition Plan  :    Status is: Inpatient   DVT Prophylaxis  :    enoxaparin  (LOVENOX ) injection 40 mg Start: 12/02/23 0200    Lab Results  Component Value Date   PLT 201 12/09/2023    Diet :  Diet Order             Diet regular Room service appropriate? Yes; Fluid consistency: Thin  Diet effective now                    Inpatient Medications  Scheduled Meds:  sodium chloride    Intravenous Once   arformoterol   15 mcg Nebulization BID   budesonide  (PULMICORT ) nebulizer solution  0.25 mg Nebulization BID   Chlorhexidine  Gluconate Cloth  6 each Topical Daily   divalproex   250 mg Oral Q12H   docusate sodium   100 mg Oral BID   enoxaparin  (LOVENOX ) injection  40 mg Subcutaneous Daily   folic acid   1 mg Oral Daily   guaiFENesin   20 mL Oral Q8H   insulin  aspart  0-15 Units Subcutaneous Q4H   levETIRAcetam   500 mg Oral BID   LORazepam   0-4 mg Intravenous Q6H   Followed by   LORazepam   0-4 mg Intravenous Q12H   multivitamin with minerals  1 tablet Oral Daily   mupirocin  ointment  1 Application Nasal BID   nicotine   21 mg Transdermal Daily   pantoprazole   40 mg Oral Daily    polyethylene glycol  17 g Oral Daily   predniSONE   20 mg Oral Q breakfast   QUEtiapine   25 mg Oral BID   revefenacin   175 mcg Nebulization Daily   sodium chloride  flush  10-40 mL Intracatheter Q12H   thiamine  (VITAMIN B1) injection  100 mg Intravenous Daily   zonisamide   200 mg Oral Daily   Continuous Infusions:  lactated ringers      lactated ringers      PRN Meds:.acetaminophen  **OR** acetaminophen , Gerhardt's butt cream, haloperidol  lactate, ipratropium-albuterol , loperamide , LORazepam  **OR** LORazepam , ondansetron  **OR** ondansetron  (ZOFRAN ) IV, mouth rinse, senna-docusate    Objective:   Vitals:   12/09/23 0400 12/09/23 0429 12/09/23 0737 12/09/23 0739  BP: 99/67 91/61  (!) 92/58  Pulse: 93   85  Resp: (!) 23   (!) 25  Temp: (!) 97.3 F (36.3 C) (!) 97.3 F (36.3 C) 99.5 F (37.5 C) 99.5 F (37.5 C)  TempSrc: Axillary Axillary Axillary   SpO2: 95%   94%  Weight:      Height:        Wt Readings from Last 3 Encounters:  12/07/23 53.8 kg  12/01/23 58 kg  11/30/23 58 kg     Intake/Output Summary (Last 24 hours) at 12/09/2023 0906 Last data filed at 12/09/2023 0400 Gross per 24 hour  Intake 379.33 ml  Output --  Net 379.33 ml     Physical Exam  Awake but mildly confused, No new F.N deficits, Normal affect, left IJ central line [place].AT,PERRAL Supple Neck, No JVD,  Symmetrical Chest wall movement, Good air movement bilaterally, CTAB RRR,No Gallops,Rubs or new Murmurs,  +ve B.Sounds, Abd Soft, No tenderness,   No Cyanosis, Clubbing or edema        Data Review:    Recent Labs  Lab 12/05/23 0500 12/06/23 0400 12/07/23 0337 12/08/23 0343 12/09/23 0500  WBC 6.3 6.2 5.9 5.7 4.2  HGB 8.2* 7.7* 7.4* 7.0* 9.2*  HCT 28.6* 26.9* 25.6* 25.2* 31.6*  PLT 103* 96* 105* 131* 201  MCV 75.7* 73.7* 74.9* 75.4* 75.6*  MCH 21.7* 21.1* 21.6* 21.0* 22.0*  MCHC 28.7* 28.6* 28.9* 27.8* 29.1*  RDW 21.3* 21.3* 21.2* 21.1* 20.6*  LYMPHSABS  --   --   --   --  1.4  MONOABS   --   --   --   --  0.5  EOSABS  --   --   --   --  0.1  BASOSABS  --   --   --   --  0.0    Recent Labs  Lab 12/02/23 1058 12/02/23 1141 12/02/23 1143 12/02/23 1434 12/02/23 1635 12/02/23 1711 12/04/23 2000 12/05/23 0500 12/06/23 0400 12/07/23 0337 12/08/23 0343 12/08/23 0942 12/09/23 0500  NA  --   --   --   --   --    < >  --  140 136 136 138  --  138  K  --   --   --   --   --    < >  --  3.3* 3.5 3.1* 3.6  --  3.9  CL  --   --   --   --   --    < >  --  102 99 101 107  --  107  CO2  --   --   --   --   --    < >  --  32 28 28 23   --  21*  ANIONGAP  --   --   --   --   --    < >  --  6 9 7 8   --  10  GLUCOSE  --   --   --   --   --    < >  --  182* 166* 154* 150*  --  209*  BUN  --   --   --   --   --    < >  --  10 9 7  5*  --  <5*  CREATININE  --   --   --   --   --    < >  --  0.42* 0.38* 0.47 0.36*  --  0.47  CRP  --   --   --   --   --   --   --   --   --   --   --  1.3* 0.8  PROCALCITON  --   --  4.45  --   --   --   --  0.87  --   --   --  0.12 <0.10  LATICACIDVEN 3.9* 3.7*  --  2.6* 2.3*  --   --   --   --   --   --   --   --   BNP  --   --   --   --   --   --   --   --   --   --   --  135.1* 33.4  MG  --   --   --   --   --    < >  --  2.1 1.9 2.0 2.0  --  2.2  PHOS  --   --   --   --   --    < > 3.5 3.3 2.9 4.7* 3.5  --   --   CALCIUM   --   --   --   --   --    < >  --  8.3* 8.7* 8.2* 8.3*  --  8.3*   < > = values in this interval not displayed.      Recent Labs  Lab 12/02/23 1058 12/02/23 1141 12/02/23 1143 12/02/23 1434 12/02/23 1635 12/03/23 0436 12/05/23 0500 12/06/23 0400 12/07/23 0337 12/08/23 0343 12/08/23 0942 12/09/23 0500  CRP  --   --   --   --   --   --   --   --   --   --  1.3* 0.8  PROCALCITON  --   --  4.45  --   --   --  0.87  --   --   --  0.12 <0.10  LATICACIDVEN 3.9* 3.7*  --  2.6* 2.3*  --   --   --   --   --   --   --   BNP  --   --   --   --   --   --   --   --   --   --  135.1* 33.4  MG  --   --   --   --   --    < > 2.1 1.9  2.0 2.0  --  2.2  CALCIUM   --   --   --   --   --    < > 8.3* 8.7* 8.2* 8.3*  --  8.3*   < > = values in this interval not displayed.    --------------------------------------------------------------------------------------------------------------- Lab Results  Component Value Date   CHOL 141 11/05/2023   HDL 88 11/05/2023   LDLCALC 45 11/05/2023   TRIG 26 12/03/2023   CHOLHDL 1.6 11/05/2023    Lab Results  Component Value Date   HGBA1C 6.5 (H) 11/05/2023   Radiology Report DG Chest Port 1 View Result Date: 12/08/2023 CLINICAL DATA:  58 year old female shortness of breath and back pain. Sepsis, right lung base pneumonia. EXAM: PORTABLE CHEST 1 VIEW COMPARISON:  Portable chest 12/04/2023 and earlier. FINDINGS: Portable AP semi upright view at 0626 hours. Extubated and enteric tube removed. Stable to mildly improved lung volumes from 4 days ago. Left IJ approach central line is stable. Stable cardiac size and mediastinal contours. Ongoing dense right lung base opacity, partially veiling. No other confluent lung opacity. No pneumothorax. No pulmonary edema. Visualized tracheal air column is within normal limits. Visible bowel-gas pattern within normal limits. Stable visualized osseous structures. IMPRESSION: 1. Extubated and enteric tube removed with mildly improved lung volumes. Stable left IJ central line. 2. Continued dense right lung base opacity, favor a combination of pleural fluid and unresolved consolidation. 3. No new cardiopulmonary abnormality. Electronically Signed   By: Marlise Simpers M.D.   On: 12/08/2023 06:38     Signature  -   Lynnwood Sauer M.D on 12/09/2023 at 9:06 AM   -  To page go to [email or web address]

## 2023-12-09 NOTE — TOC Progression Note (Signed)
 Transition of Care [facility]) - Progression Note    Patient Details  Name: [name] MRN: [id number] Date of Birth: 30-Aug-1965  [facility] CM/SW Contact  Jannice Mends, LCSW Phone Number: 12/09/2023, 9:26 AM  Clinical Narrative:    CSW continuing to follow. Per patient's daughter, [name], patient will not consent to SNF placement. Additionally she has progressed too well physically to qualify. Will continue to follow.    Expected Discharge Plan: Skilled Nursing Facility Barriers to Discharge: Continued Medical Work up, SNF Pending bed offer, English as a second language teacher  Expected Discharge Plan and Services In-house Referral: Clinical Social Work   Post Acute Care Choice: Skilled Nursing Facility Living arrangements for the past 2 months: Single Family Home                                       Social Determinants of Health (SDOH) Interventions SDOH Screenings   Food Insecurity: Patient Unable To Answer (12/02/2023)  Housing: Patient Unable To Answer (12/02/2023)  Transportation Needs: Patient Unable To Answer (12/02/2023)  Utilities: Patient Unable To Answer (12/02/2023)  Alcohol  Screen: Low Risk  (04/29/2023)  Social Connections: Socially Isolated (11/04/2023)  Stress: Stress Concern Present (04/08/2023)  Tobacco Use: High Risk (12/01/2023)    Readmission Risk Interventions    12/02/2023   12:47 PM 11/05/2023    1:34 PM 12/12/2022    7:47 AM  Readmission Risk Prevention Plan  Transportation Screening Complete Complete Complete  Medication Review (RN Care Manager) Referral to Pharmacy Referral to Pharmacy Referral to Pharmacy  PCP or Specialist appointment within 3-5 days of discharge Complete Complete Complete  HRI or Home Care Consult Complete Complete Complete  SW Recovery Care/Counseling Consult Complete Complete Complete  Palliative Care Screening Not Applicable Not Applicable Not Applicable  Skilled Nursing Facility Not Applicable Not Applicable  Not Applicable

## 2023-12-10 DIAGNOSIS — R6520 Severe sepsis without septic shock: Secondary | ICD-10-CM | POA: Diagnosis not present

## 2023-12-10 DIAGNOSIS — E872 Acidosis, unspecified: Secondary | ICD-10-CM | POA: Diagnosis not present

## 2023-12-10 DIAGNOSIS — A419 Sepsis, unspecified organism: Secondary | ICD-10-CM | POA: Diagnosis not present

## 2023-12-10 LAB — CBC WITH DIFFERENTIAL/PLATELET
Abs Immature Granulocytes: 0.03 10*3/uL (ref 0.00–0.07)
Basophils Absolute: 0 10*3/uL (ref 0.0–0.1)
Basophils Relative: 0 %
Eosinophils Absolute: 0 10*3/uL (ref 0.0–0.5)
Eosinophils Relative: 1 %
HCT: 29.5 % — ABNORMAL LOW (ref 36.0–46.0)
Hemoglobin: 8.6 g/dL — ABNORMAL LOW (ref 12.0–15.0)
Immature Granulocytes: 1 %
Lymphocytes Relative: 31 %
Lymphs Abs: 1.8 10*3/uL (ref 0.7–4.0)
MCH: 22.2 pg — ABNORMAL LOW (ref 26.0–34.0)
MCHC: 29.2 g/dL — ABNORMAL LOW (ref 30.0–36.0)
MCV: 76.2 fL — ABNORMAL LOW (ref 80.0–100.0)
Monocytes Absolute: 0.4 10*3/uL (ref 0.1–1.0)
Monocytes Relative: 7 %
Neutro Abs: 3.7 10*3/uL (ref 1.7–7.7)
Neutrophils Relative %: 60 %
Platelets: 263 10*3/uL (ref 150–400)
RBC: 3.87 MIL/uL (ref 3.87–5.11)
RDW: 21.2 % — ABNORMAL HIGH (ref 11.5–15.5)
WBC: 6 10*3/uL (ref 4.0–10.5)
nRBC: 0 % (ref 0.0–0.2)

## 2023-12-10 LAB — GLUCOSE, CAPILLARY
Glucose-Capillary: 113 mg/dL — ABNORMAL HIGH (ref 70–99)
Glucose-Capillary: 115 mg/dL — ABNORMAL HIGH (ref 70–99)
Glucose-Capillary: 185 mg/dL — ABNORMAL HIGH (ref 70–99)
Glucose-Capillary: 261 mg/dL — ABNORMAL HIGH (ref 70–99)
Glucose-Capillary: 310 mg/dL — ABNORMAL HIGH (ref 70–99)
Glucose-Capillary: 317 mg/dL — ABNORMAL HIGH (ref 70–99)

## 2023-12-10 LAB — BASIC METABOLIC PANEL WITH GFR
Anion gap: 7 (ref 5–15)
BUN: 8 mg/dL (ref 6–20)
CO2: 22 mmol/L (ref 22–32)
Calcium: 8.4 mg/dL — ABNORMAL LOW (ref 8.9–10.3)
Chloride: 107 mmol/L (ref 98–111)
Creatinine, Ser: 0.62 mg/dL (ref 0.44–1.00)
GFR, Estimated: >60 mL/min (ref >60)
Glucose, Bld: 307 mg/dL — ABNORMAL HIGH (ref 70–99)
Potassium: 3.3 mmol/L — ABNORMAL LOW (ref 3.5–5.1)
Sodium: 136 mmol/L (ref 135–145)

## 2023-12-10 LAB — C-REACTIVE PROTEIN: CRP: 0.7 mg/dL (ref <1.0)

## 2023-12-10 LAB — BRAIN NATRIURETIC PEPTIDE: B Natriuretic Peptide: 33.8 pg/mL (ref 0.0–100.0)

## 2023-12-10 LAB — BPAM RBC: Blood Product Expiration Date: 202507092359

## 2023-12-10 LAB — MAGNESIUM: Magnesium: 2.1 mg/dL (ref 1.7–2.4)

## 2023-12-10 LAB — PROCALCITONIN: Procalcitonin: <0.1 ng/mL

## 2023-12-10 LAB — TYPE AND SCREEN

## 2023-12-10 MED ORDER — POTASSIUM CHLORIDE CRYS ER 20 MEQ PO TBCR
40.0000 meq | EXTENDED_RELEASE_TABLET | Freq: Once | ORAL | Status: AC
  Administered 2023-12-10: 40 meq via ORAL
  Filled 2023-12-10: qty 2

## 2023-12-10 MED ORDER — LACTATED RINGERS IV BOLUS
500.0000 mL | Freq: Once | INTRAVENOUS | Status: AC
  Administered 2023-12-10: 500 mL via INTRAVENOUS

## 2023-12-10 MED ORDER — PREDNISONE 5 MG PO TABS
10.0000 mg | ORAL_TABLET | Freq: Every day | ORAL | Status: DC
  Administered 2023-12-10 – 2023-12-12 (×3): 10 mg via ORAL
  Filled 2023-12-10 (×4): qty 2

## 2023-12-10 MED ORDER — MIDODRINE HCL 5 MG PO TABS
10.0000 mg | ORAL_TABLET | Freq: Three times a day (TID) | ORAL | Status: DC
  Administered 2023-12-10 – 2023-12-12 (×9): 10 mg via ORAL
  Filled 2023-12-10 (×10): qty 2

## 2023-12-10 NOTE — Progress Notes (Signed)
 Overnight contacted re:  Pt continues to be agitated, restless, anxious  Attempts to leave the room to leave the hospital - states that her sister is going to pick her up, or attempts to leave because she wants to smoke  Pt is unable to be reoriented, has visible tremors, elevated HR/RR, safety sitter 1:1 order continues

## 2023-12-10 NOTE — Progress Notes (Signed)
 PROGRESS NOTE                                                                                                                                                                                                             Patient Demographics:    [name], is a 58 y.o. female, DOB - [birth year], ZOX:[id number]  Outpatient Primary MD for the patient is Patient, No Pcp Per    LOS - 9  Admit date - 12/01/2023    Chief Complaint  Patient presents with   RIB CAGE PAIN       Brief Narrative (HPI from H&P)   58 y.o. female with medical history significant for COPD, seizure disorder, iron  deficiency anemia, depression/anxiety/schizophrenia, polysubstance use (EtOH, cocaine, tobacco), medical nonadherence and frequent ED visits who is admitted with severe sepsis due to right lower lobe pneumonia.  She was initially intubated and kept in ICU, stabilized and transferred to my care on 12/08/2023 on day 7 of her hospital stay.   Significant Hospital Events: Including procedures, antibiotic start and stop dates in addition to other pertinent events   Multiple ER visits for various complaints noted, 20+ this year 6/11 intubated lined pressors bronch/BAL  6/12 weaning pressors, changing sedation 6/13 cortrak, hopeful extubation        Subjective:   Patient in bed, appears comfortable, denies any headache, no fever, no chest pain or pressure, no shortness of breath , no abdominal pain. No new focal weakness.    Assessment  & Plan :    RLL PNA with sepsis present on admission  -acutely worsened on presentation, requiring intubation and transfer to ICU on 6/11. Later extubated 6/13 -completed 5 days of vanc/zosyn  as of 6/16 -Seems to be improved since admit -Leukocytosis normalized - Courage to sit in chair use I-S and flutter valve for pulmonary toiletry, advance activity and monitor.  He knows clinical signs of pneumonia.   ETOH  abuse with DTs - Mild DTs, on CIWA protocol, add low-dose Seroquel  and monitor.   Hypotension.  Morning of 12/09/2023.  Hydrate, midodrine , TED stockings & monitor.    COPD -cont with current bronchodilators -wean off steroids as tolerated   Hx seizures -stable thus far -cont AED's   FE deficiency anemia - Chronic with acute fall due to heme dilution and given blood draws, after consent 2  units of packed RBC of 12/08/2023.  Discussed with patient and her daughter.   Depression/Anxiety/Schizophrenia -seen by Psychiatry.  -recs for depakote  500mg  BID with ativan  PRN, cont abilify  10mg  every day, remeron at bedtime 7.5mg         Condition - Extremely Guarded  Family Communication  : [name] [phone]  on 12/08/2023  Code Status :  Full  Consults  :  PCCM  PUD Prophylaxis :    Procedures  :            Disposition Plan  :    Status is: Inpatient   DVT Prophylaxis  :    enoxaparin  (LOVENOX ) injection 40 mg Start: 12/02/23 0200    Lab Results  Component Value Date   PLT 263 12/10/2023    Diet :  Diet Order             Diet regular Room service appropriate? Yes; Fluid consistency: Thin  Diet effective now                    Inpatient Medications  Scheduled Meds:  sodium chloride    Intravenous Once   arformoterol   15 mcg Nebulization BID   budesonide  (PULMICORT ) nebulizer solution  0.25 mg Nebulization BID   Chlorhexidine  Gluconate Cloth  6 each Topical Daily   divalproex   250 mg Oral Q12H   docusate sodium   100 mg Oral BID   enoxaparin  (LOVENOX ) injection  40 mg Subcutaneous Daily   folic acid   1 mg Oral Daily   guaiFENesin   20 mL Oral Q8H   insulin  aspart  0-15 Units Subcutaneous Q4H   LORazepam   0-4 mg Intravenous Q12H   midodrine   10 mg Oral TID WC   multivitamin with minerals  1 tablet Oral Daily   nicotine   21 mg Transdermal Daily   pantoprazole   40 mg Oral Daily   polyethylene glycol  17 g Oral Daily   potassium chloride   40  mEq Oral Once   predniSONE   10 mg Oral Q breakfast   QUEtiapine   25 mg Oral BID   revefenacin   175 mcg Nebulization Daily   sodium chloride  flush  10-40 mL Intracatheter Q12H   thiamine   100 mg Oral Daily   zonisamide   200 mg Oral Daily   Continuous Infusions:  lactated ringers       PRN Meds:.acetaminophen  **OR** acetaminophen , diltiazem , Gerhardt's butt cream, haloperidol  lactate, ipratropium-albuterol , loperamide , LORazepam  **OR** LORazepam , ondansetron  **OR** ondansetron  (ZOFRAN ) IV, mouth rinse, senna-docusate    Objective:   Vitals:   12/10/23 0512 12/10/23 0600 12/10/23 0722 12/10/23 0749  BP: 91/60 97/61  (!) 88/59  Pulse: 88 87 74 80  Resp:   16 17  Temp:    (!) 97.3 F (36.3 C)  TempSrc:    Axillary  SpO2: 98% 100%  94%  Weight:      Height:        Wt Readings from Last 3 Encounters:  12/07/23 53.8 kg  12/01/23 58 kg  11/30/23 58 kg     Intake/Output Summary (Last 24 hours) at 12/10/2023 0954 Last data filed at 12/09/2023 1758 Gross per 24 hour  Intake 1080 ml  Output --  Net 1080 ml     Physical Exam  Awake but mildly confused, No new F.N deficits, Normal affect, left IJ central line [place].AT,PERRAL Supple Neck, No JVD,   Symmetrical Chest wall movement, Good air movement bilaterally, CTAB RRR,No Gallops,Rubs or new Murmurs,  +ve B.Sounds, Abd Soft, No tenderness,  No Cyanosis, Clubbing or edema        Data Review:    Recent Labs  Lab 12/06/23 0400 12/07/23 0337 12/08/23 0343 12/09/23 0500 12/10/23 0405  WBC 6.2 5.9 5.7 4.2 6.0  HGB 7.7* 7.4* 7.0* 9.2* 8.6*  HCT 26.9* 25.6* 25.2* 31.6* 29.5*  PLT 96* 105* 131* 201 263  MCV 73.7* 74.9* 75.4* 75.6* 76.2*  MCH 21.1* 21.6* 21.0* 22.0* 22.2*  MCHC 28.6* 28.9* 27.8* 29.1* 29.2*  RDW 21.3* 21.2* 21.1* 20.6* 21.2*  LYMPHSABS  --   --   --  1.4 1.8  MONOABS  --   --   --  0.5 0.4  EOSABS  --   --   --  0.1 0.0  BASOSABS  --   --   --  0.0 0.0    Recent Labs  Lab 12/04/23 2000  12/05/23 0500 12/06/23 0400 12/07/23 0337 12/08/23 0343 12/08/23 0942 12/09/23 0500 12/10/23 0405  NA  --  140 136 136 138  --  138 136  K  --  3.3* 3.5 3.1* 3.6  --  3.9 3.3*  CL  --  102 99 101 107  --  107 107  CO2  --  32 28 28 23   --  21* 22  ANIONGAP  --  6 9 7 8   --  10 7  GLUCOSE  --  182* 166* 154* 150*  --  209* 307*  BUN  --  10 9 7  5*  --  <5* 8  CREATININE  --  0.42* 0.38* 0.47 0.36*  --  0.47 0.62  CRP  --   --   --   --   --  1.3* 0.8 0.7  PROCALCITON  --  0.87  --   --   --  0.12 <0.10 <0.10  BNP  --   --   --   --   --  135.1* 33.4 33.8  MG  --  2.1 1.9 2.0 2.0  --  2.2 2.1  PHOS 3.5 3.3 2.9 4.7* 3.5  --   --   --   CALCIUM   --  8.3* 8.7* 8.2* 8.3*  --  8.3* 8.4*      Recent Labs  Lab 12/05/23 0500 12/06/23 0400 12/07/23 0337 12/08/23 0343 12/08/23 0942 12/09/23 0500 12/10/23 0405  CRP  --   --   --   --  1.3* 0.8 0.7  PROCALCITON 0.87  --   --   --  0.12 <0.10 <0.10  BNP  --   --   --   --  135.1* 33.4 33.8  MG 2.1 1.9 2.0 2.0  --  2.2 2.1  CALCIUM  8.3* 8.7* 8.2* 8.3*  --  8.3* 8.4*    --------------------------------------------------------------------------------------------------------------- Lab Results  Component Value Date   CHOL 141 11/05/2023   HDL 88 11/05/2023   LDLCALC 45 11/05/2023   TRIG 26 12/03/2023   CHOLHDL 1.6 11/05/2023    Lab Results  Component Value Date   HGBA1C 6.5 (H) 11/05/2023   Radiology Report DG Chest Port 1 View Result Date: 12/09/2023 CLINICAL DATA:  141880 SOB (shortness of breath) [id number] EXAM: PORTABLE CHEST 1 VIEW COMPARISON:  Chest x-ray 12/08/2023 FINDINGS: Left internal jugular central venous catheter with tip overlying the superior cava junction. The heart and mediastinal contours are within normal limits. Low lung volumes. Persistent right base airspace opacity. Question developing patchy airspace opacities bilaterally. Trace right pleural effusion. No pulmonary edema. No left pleural effusion.  No  pneumothorax. No acute osseous abnormality. IMPRESSION: Low lung volumes. Persistent right base airspace opacity. Question developing patchy airspace opacities bilaterally. Trace right pleural effusion. Recommend repeat chest x-ray PA lateral view for further evaluation. Electronically Signed   By: Morgane  Naveau M.D.   On: 12/09/2023 18:35     Signature  -   Lynnwood Sauer M.D on 12/10/2023 at 9:54 AM   -  To page go to [email or web address]

## 2023-12-10 NOTE — Progress Notes (Signed)
 Physical Therapy Treatment Patient Details Name: [name] MRN: [id number] DOB: Nov 19, 1965 Today's Date: 12/10/2023   History of Present Illness Pt is 58 year old presented [facility]  [facility] for SOB, RUQ and R chest pain. Pt with acute hypoxic resp failure with sepsis due to PNA. Intubated 6/11 and extubated 6/13. Pt also with acute metabolic encephalopathy. PMH - alcohol  use disorder, polysubstance use, a-fib not on anticoagulation, COPD, seizure disorder, IDA, B12 deficiency, hypothyroidism, schizophrenia.    PT Comments  Progressing towards functional goals. Agreeable to try RW, and ambulated 100 feet with supervision for safety. Fair power up to stand from bed and stabilized with RW for support. Still demonstrates some reduced insight into deficits and safety. Consistently following simple single step commands. Patient will continue to benefit from skilled physical therapy services to further improve independence with functional mobility.     If plan is discharge home, recommend the following: Assistance with cooking/housework;Supervision due to cognitive status;Direct supervision/assist for financial management;Direct supervision/assist for medications management;A little help with walking and/or transfers;Help with stairs or ramp for entrance   Can travel by private vehicle     Yes  Equipment Recommendations  Other (comment);None recommended by PT (recommend pt use cane she has at home)    Recommendations for Other Services       Precautions / Restrictions Precautions Precautions: Fall Recall of Precautions/Restrictions: Impaired Restrictions Weight Bearing Restrictions Per Provider Order: No     Mobility  Bed Mobility Overal bed mobility: Needs Assistance Bed Mobility: Supine to Sit     Supine to sit: Supervision Sit to supine: Supervision   General bed mobility comments: supervision for safety, extra time, no assist needed.    Transfers Overall transfer  level: Needs assistance Equipment used: Rolling walker (2 wheels) Transfers: Sit to/from Stand Sit to Stand: Supervision           General transfer comment: Supervision for safety, fair power up to stand, steadies self with RW for support.    Ambulation/Gait Ambulation/Gait assistance: Supervision Gait Distance (Feet): 100 Feet Assistive device: Rolling walker (2 wheels) Gait Pattern/deviations: Step-through pattern, Drifts right/left, Decreased stride length Gait velocity: decr Gait velocity interpretation: <1.8 ft/sec, indicate of risk for recurrent falls   General Gait Details: Minimal instability with RW for support. No buckling noted. Minor fatigue but Spo2 91% and greater on RA throughout distance. HR to 111. Recalls way back to room. Cues for safety and awareness. Good RW control.   Stairs             Wheelchair Mobility     Tilt Bed    Modified Rankin (Stroke Patients Only)       Balance Overall balance assessment: Needs assistance Sitting-balance support: No upper extremity supported, Feet supported Sitting balance-Leahy Scale: Good     Standing balance support: No upper extremity supported, During functional activity Standing balance-Leahy Scale: Fair Standing balance comment: More stable with RW for support.                            Communication Communication Communication: No apparent difficulties  Cognition Arousal: Alert Behavior During Therapy: WFL for tasks assessed/performed   PT - Cognitive impairments: Awareness, Memory, Problem solving, Safety/Judgement, Attention                         Following commands: Intact Following commands impaired: Only follows one step commands consistently  Cueing Cueing Techniques: Verbal cues, Tactile cues, Gestural cues  Exercises      General Comments General comments (skin integrity, edema, etc.): SpO2 91% and greater on RA, HR to 111 with giat.      Pertinent  Vitals/Pain Pain Assessment Pain Assessment: No/denies pain    Home Living                          Prior Function            PT Goals (current goals can now be found in the care plan section) Acute Rehab PT Goals Patient Stated Goal: To walk and move around more PT Goal Formulation: With patient Time For Goal Achievement: 12/19/23 Potential to Achieve Goals: Good Progress towards PT goals: Progressing toward goals    Frequency    Min 2X/week      PT Plan      Co-evaluation              AM-PAC PT 6 Clicks Mobility   Outcome Measure  Help needed turning from your back to your side while in a flat bed without using bedrails?: None Help needed moving from lying on your back to sitting on the side of a flat bed without using bedrails?: A Little Help needed moving to and from a bed to a chair (including a wheelchair)?: A Little Help needed standing up from a chair using your arms (e.g., wheelchair or bedside chair)?: A Little Help needed to walk in hospital room?: A Little Help needed climbing 3-5 steps with a railing? : A Little 6 Click Score: 19    End of Session Equipment Utilized During Treatment: Gait belt Activity Tolerance: Patient tolerated treatment well Patient left: with call bell/phone within reach;in bed;with bed alarm set (waist alarm on; educated on use to remove.) Nurse Communication: Mobility status PT Visit Diagnosis: Unsteadiness on feet (R26.81);Other abnormalities of gait and mobility (R26.89);Muscle weakness (generalized) (M62.81)     Time: 0981-1914 PT Time Calculation (min) (ACUTE ONLY): 26 min  Charges:    $Gait Training: 8-22 mins $Therapeutic Activity: 8-22 mins PT General Charges $$ ACUTE PT VISIT: 1 Visit                     Jory Ng, PT, DPT [facility]  Rehabilitation Services Physical Therapist Office: [phone] Website: [email or web address]    Alinda Irani 12/10/2023, 4:23 PM

## 2023-12-10 NOTE — Plan of Care (Signed)
  Problem: Coping: Goal: Ability to adjust to condition or change in health will improve Outcome: Progressing   Problem: Fluid Volume: Goal: Ability to maintain a balanced intake and output will improve Outcome: Progressing   Problem: Metabolic: Goal: Ability to maintain appropriate glucose levels will improve Outcome: Progressing   Problem: Health Behavior/Discharge Planning: Goal: Ability to manage health-related needs will improve Outcome: Progressing   Problem: Clinical Measurements: Goal: Ability to maintain clinical measurements within normal limits will improve Outcome: Progressing Goal: Respiratory complications will improve Outcome: Progressing Goal: Cardiovascular complication will be avoided Outcome: Progressing

## 2023-12-11 ENCOUNTER — Inpatient Hospital Stay (HOSPITAL_COMMUNITY)

## 2023-12-11 DIAGNOSIS — E872 Acidosis, unspecified: Secondary | ICD-10-CM | POA: Diagnosis not present

## 2023-12-11 DIAGNOSIS — R6520 Severe sepsis without septic shock: Secondary | ICD-10-CM | POA: Diagnosis not present

## 2023-12-11 DIAGNOSIS — A419 Sepsis, unspecified organism: Secondary | ICD-10-CM | POA: Diagnosis not present

## 2023-12-11 LAB — BASIC METABOLIC PANEL WITH GFR
Anion gap: 7 (ref 5–15)
BUN: 8 mg/dL (ref 6–20)
CO2: 24 mmol/L (ref 22–32)
Calcium: 8.3 mg/dL — ABNORMAL LOW (ref 8.9–10.3)
Chloride: 108 mmol/L (ref 98–111)
Creatinine, Ser: 0.47 mg/dL (ref 0.44–1.00)
GFR, Estimated: >60 mL/min (ref >60)
Glucose, Bld: 150 mg/dL — ABNORMAL HIGH (ref 70–99)
Potassium: 4.3 mmol/L (ref 3.5–5.1)
Sodium: 139 mmol/L (ref 135–145)

## 2023-12-11 LAB — CBC WITH DIFFERENTIAL/PLATELET
Abs Immature Granulocytes: 0.05 10*3/uL (ref 0.00–0.07)
Basophils Absolute: 0 10*3/uL (ref 0.0–0.1)
Basophils Relative: 0 %
Eosinophils Absolute: 0 10*3/uL (ref 0.0–0.5)
Eosinophils Relative: 1 %
HCT: 29.3 % — ABNORMAL LOW (ref 36.0–46.0)
Hemoglobin: 8.4 g/dL — ABNORMAL LOW (ref 12.0–15.0)
Immature Granulocytes: 1 %
Lymphocytes Relative: 30 %
Lymphs Abs: 2.3 10*3/uL (ref 0.7–4.0)
MCH: 22.1 pg — ABNORMAL LOW (ref 26.0–34.0)
MCHC: 28.7 g/dL — ABNORMAL LOW (ref 30.0–36.0)
MCV: 77.1 fL — ABNORMAL LOW (ref 80.0–100.0)
Monocytes Absolute: 0.6 10*3/uL (ref 0.1–1.0)
Monocytes Relative: 8 %
Neutro Abs: 4.7 10*3/uL (ref 1.7–7.7)
Neutrophils Relative %: 60 %
Platelets: 368 10*3/uL (ref 150–400)
RBC: 3.8 MIL/uL — ABNORMAL LOW (ref 3.87–5.11)
RDW: 21.8 % — ABNORMAL HIGH (ref 11.5–15.5)
WBC: 7.8 10*3/uL (ref 4.0–10.5)
nRBC: 0 % (ref 0.0–0.2)

## 2023-12-11 LAB — C-REACTIVE PROTEIN: CRP: 0.6 mg/dL (ref <1.0)

## 2023-12-11 LAB — MAGNESIUM: Magnesium: 2 mg/dL (ref 1.7–2.4)

## 2023-12-11 LAB — BRAIN NATRIURETIC PEPTIDE: B Natriuretic Peptide: 55.2 pg/mL (ref 0.0–100.0)

## 2023-12-11 LAB — GLUCOSE, CAPILLARY
Glucose-Capillary: 157 mg/dL — ABNORMAL HIGH (ref 70–99)
Glucose-Capillary: 153 mg/dL — ABNORMAL HIGH (ref 70–99)
Glucose-Capillary: 278 mg/dL — ABNORMAL HIGH (ref 70–99)
Glucose-Capillary: 242 mg/dL — ABNORMAL HIGH (ref 70–99)
Glucose-Capillary: 88 mg/dL (ref 70–99)

## 2023-12-11 LAB — PROCALCITONIN: Procalcitonin: <0.1 ng/mL

## 2023-12-11 MED ORDER — SODIUM CHLORIDE 0.9 % IV SOLN
12.5000 mg | Freq: Three times a day (TID) | INTRAVENOUS | Status: DC | PRN
  Filled 2023-12-11: qty 0.5

## 2023-12-11 MED ORDER — METOPROLOL TARTRATE 25 MG PO TABS
25.0000 mg | ORAL_TABLET | Freq: Two times a day (BID) | ORAL | Status: DC
  Administered 2023-12-11 – 2023-12-12 (×4): 25 mg via ORAL
  Filled 2023-12-11 (×5): qty 1

## 2023-12-11 MED ORDER — INSULIN ASPART 100 UNIT/ML IJ SOLN
0.0000 [IU] | Freq: Three times a day (TID) | INTRAMUSCULAR | Status: DC
  Administered 2023-12-11: 5 [IU] via SUBCUTANEOUS
  Administered 2023-12-11: 8 [IU] via SUBCUTANEOUS
  Administered 2023-12-12: 5 [IU] via SUBCUTANEOUS
  Administered 2023-12-12: 3 [IU] via SUBCUTANEOUS
  Administered 2023-12-12: 8 [IU] via SUBCUTANEOUS
  Administered 2023-12-13: 15 [IU] via SUBCUTANEOUS

## 2023-12-11 MED ORDER — LACTATED RINGERS IV SOLN: INTRAVENOUS | Status: AC

## 2023-12-11 NOTE — TOC Progression Note (Addendum)
 Transition of Care [facility]) - Progression Note    Patient Details  Name: [name] MRN: [id number] Date of Birth: 01-24-1966  Transition of Care [facility]) CM/SW Contact  Eusebio High, RN Phone Number: 12/11/2023, 2:21 PM  Clinical Narrative:     RNCM met with patient bedside. We discussed DC disposition. Patient states she will dc to home. She does live alone but stated her Daughter lives right around the corner. Patient wants to decline Home Health stating she is walking all over the place  She will continue to use cane at home. RNCM called and left VM for Daughter    Anticipate patient will go home tomorrow. Daughter will provide transportation        Expected Discharge Plan: Skilled Nursing Facility Barriers to Discharge: Continued Medical Work up, SNF Pending bed offer, English as a second language teacher  Expected Discharge Plan and Services In-house Referral: Clinical Social Work   Post Acute Care Choice: Skilled Nursing Facility Living arrangements for the past 2 months: Single Family Home                                       Social Determinants of Health (SDOH) Interventions SDOH Screenings   Food Insecurity: Patient Unable To Answer (12/02/2023)  Housing: Patient Unable To Answer (12/02/2023)  Transportation Needs: Patient Unable To Answer (12/02/2023)  Utilities: Patient Unable To Answer (12/02/2023)  Alcohol  Screen: Low Risk  (04/29/2023)  Social Connections: Socially Isolated (11/04/2023)  Stress: Stress Concern Present (04/08/2023)  Tobacco Use: High Risk (12/01/2023)    Readmission Risk Interventions    12/02/2023   12:47 PM 11/05/2023    1:34 PM 12/12/2022    7:47 AM  Readmission Risk Prevention Plan  Transportation Screening Complete Complete Complete  Medication Review (RN Care Manager) Referral to Pharmacy Referral to Pharmacy Referral to Pharmacy  PCP or Specialist appointment within 3-5 days of discharge Complete Complete Complete  HRI or Home Care  Consult Complete Complete Complete  SW Recovery Care/Counseling Consult Complete Complete Complete  Palliative Care Screening Not Applicable Not Applicable Not Applicable  Skilled Nursing Facility Not Applicable Not Applicable Not Applicable

## 2023-12-11 NOTE — Progress Notes (Addendum)
   12/11/23 1023  Mobility  Activity Ambulated with assistance in hallway  Level of Assistance Standby assist, set-up cues, supervision of patient - no hands on  Assistive Device Other (Comment);None (HHA,)  Distance Ambulated (ft) 800 ft  Activity Response Tolerated well  Mobility Referral Yes  Mobility visit 1 Mobility  Mobility Specialist Start Time (ACUTE ONLY) 1023  Mobility Specialist Stop Time (ACUTE ONLY) 1035  Mobility Specialist Time Calculation (min) (ACUTE ONLY) 12 min   Mobility Specialist: Progress Note  During Mobility: HR 96, SpO2 95% RA  Pt agreeable to mobility session - received in standing in room. Pt was asymptomatic throughout session with no complaints.   Returned to EOB with all needs met - call bell within reach.   _____________________________________________  During Mobility: HR 98, SpO2 93% RA  Pt agreeable to mobility session - received in standing in room. Pt was asymptomatic throughout session with no complaints.   Returned to EOB with all needs met - call bell within reach.   Isla Mari, BS Mobility Specialist Please contact via SecureChat or  Rehab office at [phone].

## 2023-12-11 NOTE — Plan of Care (Signed)
  Problem: Fluid Volume: Goal: Hemodynamic stability will improve Outcome: Progressing   Problem: Clinical Measurements: Goal: Diagnostic test results will improve Outcome: Progressing Goal: Signs and symptoms of infection will decrease Outcome: Progressing   Problem: Respiratory: Goal: Ability to maintain adequate ventilation will improve Outcome: Progressing   Problem: Education: Goal: Ability to describe self-care measures that may prevent or decrease complications (Diabetes Survival Skills Education) will improve Outcome: Progressing Goal: Individualized Educational Video(s) Outcome: Progressing   Problem: Coping: Goal: Ability to adjust to condition or change in health will improve Outcome: Progressing   Problem: Fluid Volume: Goal: Ability to maintain a balanced intake and output will improve Outcome: Progressing   Problem: Health Behavior/Discharge Planning: Goal: Ability to identify and utilize available resources and services will improve Outcome: Progressing Goal: Ability to manage health-related needs will improve Outcome: Progressing   Problem: Metabolic: Goal: Ability to maintain appropriate glucose levels will improve Outcome: Progressing   Problem: Nutritional: Goal: Maintenance of adequate nutrition will improve Outcome: Progressing Goal: Progress toward achieving an optimal weight will improve Outcome: Progressing   Problem: Skin Integrity: Goal: Risk for impaired skin integrity will decrease Outcome: Progressing   Problem: Tissue Perfusion: Goal: Adequacy of tissue perfusion will improve Outcome: Progressing   Problem: Education: Goal: Knowledge of General Education information will improve Description: Including pain rating scale, medication(s)/side effects and non-pharmacologic comfort measures Outcome: Progressing   Problem: Health Behavior/Discharge Planning: Goal: Ability to manage health-related needs will improve Outcome:  Progressing   Problem: Clinical Measurements: Goal: Ability to maintain clinical measurements within normal limits will improve Outcome: Progressing Goal: Will remain free from infection Outcome: Progressing Goal: Diagnostic test results will improve Outcome: Progressing Goal: Respiratory complications will improve Outcome: Progressing Goal: Cardiovascular complication will be avoided Outcome: Progressing   Problem: Activity: Goal: Risk for activity intolerance will decrease Outcome: Progressing   Problem: Nutrition: Goal: Adequate nutrition will be maintained Outcome: Progressing   Problem: Coping: Goal: Level of anxiety will decrease Outcome: Progressing   Problem: Elimination: Goal: Will not experience complications related to bowel motility Outcome: Progressing Goal: Will not experience complications related to urinary retention Outcome: Progressing   Problem: Pain Managment: Goal: General experience of comfort will improve and/or be controlled Outcome: Progressing   Problem: Safety: Goal: Ability to remain free from injury will improve Outcome: Progressing   Problem: Skin Integrity: Goal: Risk for impaired skin integrity will decrease Outcome: Progressing   Problem: Safety: Goal: Non-violent Restraint(s) Outcome: Progressing

## 2023-12-11 NOTE — Progress Notes (Addendum)
 PROGRESS NOTE                                                                                                                                                                                                             Patient Demographics:    Isley Weisheit, is a 58 y.o. female, DOB - 1965/06/27, NWG:[id number]  Outpatient Primary MD for the patient is Patient, No Pcp Per    LOS - 10  Admit date - 12/01/2023    Chief Complaint  Patient presents with   RIB CAGE PAIN       Brief Narrative (HPI from H&P)   58 y.o. female with medical history significant for COPD, seizure disorder, iron  deficiency anemia, depression/anxiety/schizophrenia, polysubstance use (EtOH, cocaine, tobacco), medical nonadherence and frequent ED visits who is admitted with severe sepsis due to right lower lobe pneumonia.  She was initially intubated and kept in ICU, stabilized and transferred to my care on 12/08/2023 on day 7 of her hospital stay.   Significant Hospital Events: Including procedures, antibiotic start and stop dates in addition to other pertinent events   Multiple ER visits for various complaints noted, 20+ this year 6/11 intubated lined pressors bronch/BAL  6/12 weaning pressors, changing sedation 6/13 cortrak, hopeful extubation        Subjective:   Patient in bed, appears comfortable, denies any headache, no fever, no chest pain or pressure, no shortness of breath , no abdominal pain. No focal weakness.   Assessment  & Plan :    RLL PNA with sepsis present on admission  -acutely worsened on presentation, requiring intubation and transfer to ICU on 6/11. Later extubated 6/13 -completed 5 days of vanc/zosyn  as of 6/16 -Seems to be improved since admit -Leukocytosis normalized - Courage to sit in chair use I-S and flutter valve for pulmonary toiletry, advance activity and monitor.  He knows clinical signs of pneumonia.   ETOH  abuse with DTs - Mild DTs, on CIWA protocol, add low-dose Seroquel  and monitor.   Hypotension.  Morning of 12/09/2023.  Hydrate, midodrine , TED stockings & monitor.    COPD -cont with current bronchodilators -wean off steroids as tolerated   Hx seizures -stable thus far -cont AED's   FE deficiency anemia - Chronic with acute fall due to heme dilution and given blood draws, after consent 2 units of  packed RBC of 12/08/2023.  Discussed with patient and her daughter.   Asymptomatic nonsustained V. tach around when patient was agitated early morning 12/11/2023.  EF over 50, electrolytes stable. Minimize V. tach causing medications, add low-dose beta-blocker if blood pressure allows.  Continue to monitor, she is symptom-free.  Depression/Anxiety/Schizophrenia -seen by Psychiatry.  -recs for depakote  500mg  BID with ativan  PRN, cont abilify  10mg  every day, remeron at bedtime 7.5mg    Diet controlled DM type II.  Steroid-induced high sugars.  Cover with ISS and monitor.  A1c 6.5, PCP to monitor postdischarge.       Condition - Extremely Guarded  Family Communication  : [name] [phone]  on 12/08/2023  Code Status :  Full  Consults  :  PCCM  PUD Prophylaxis :    Procedures  :            Disposition Plan  :    Status is: Inpatient   DVT Prophylaxis  :    Place TED hose Start: 12/10/23 0954 enoxaparin  (LOVENOX ) injection 40 mg Start: 12/02/23 0200    Lab Results  Component Value Date   PLT 368 12/11/2023    Diet :  Diet Order             Diet Carb Modified Fluid consistency: Thin; Room service appropriate? Yes  Diet effective now                    Inpatient Medications  Scheduled Meds:  sodium chloride    Intravenous Once   arformoterol   15 mcg Nebulization BID   budesonide  (PULMICORT ) nebulizer solution  0.25 mg Nebulization BID   Chlorhexidine  Gluconate Cloth  6 each Topical Daily   divalproex   250 mg Oral Q12H   docusate sodium   100 mg  Oral BID   enoxaparin  (LOVENOX ) injection  40 mg Subcutaneous Daily   folic acid   1 mg Oral Daily   guaiFENesin   20 mL Oral Q8H   insulin  aspart  0-15 Units Subcutaneous TID WC   LORazepam   0-4 mg Intravenous Q12H   midodrine   10 mg Oral TID WC   multivitamin with minerals  1 tablet Oral Daily   nicotine   21 mg Transdermal Daily   pantoprazole   40 mg Oral Daily   polyethylene glycol  17 g Oral Daily   predniSONE   10 mg Oral Q breakfast   QUEtiapine   25 mg Oral BID   revefenacin   175 mcg Nebulization Daily   sodium chloride  flush  10-40 mL Intracatheter Q12H   thiamine   100 mg Oral Daily   zonisamide   200 mg Oral Daily   Continuous Infusions:  promethazine  (PHENERGAN ) injection (IM or IVPB)      PRN Meds:.acetaminophen  **OR** acetaminophen , diltiazem , Gerhardt's butt cream, haloperidol  lactate, ipratropium-albuterol , loperamide , mouth rinse, promethazine  (PHENERGAN ) injection (IM or IVPB), senna-docusate    Objective:   Vitals:   12/11/23 0400 12/11/23 0500 12/11/23 0600 12/11/23 0755  BP: (!) 101/58 (!) 98/51 131/67 114/80  Pulse: 86 84 90 (!) 107  Resp: (!) 21 19 (!) 25   Temp:  (!) 97 F (36.1 C)  97.7 F (36.5 C)  TempSrc:  Oral  Axillary  SpO2: 99% 100% 99% 93%  Weight:  55.9 kg    Height:        Wt Readings from Last 3 Encounters:  12/11/23 55.9 kg  12/01/23 58 kg  11/30/23 58 kg     Intake/Output Summary (Last 24 hours) at 12/11/2023 0829 Last data  filed at 12/11/2023 0100 Gross per 24 hour  Intake --  Output 1250 ml  Net -1250 ml     Physical Exam  Awake but mildly confused, No new F.N deficits, Normal affect, left IJ central line [place].AT,PERRAL Supple Neck, No JVD,   Symmetrical Chest wall movement, Good air movement bilaterally, CTAB RRR,No Gallops,Rubs or new Murmurs,  +ve B.Sounds, Abd Soft, No tenderness,   No Cyanosis, Clubbing or edema        Data Review:    Recent Labs  Lab 12/07/23 0337 12/08/23 0343 12/09/23 0500 12/10/23 0405  12/11/23 0349  WBC 5.9 5.7 4.2 6.0 7.8  HGB 7.4* 7.0* 9.2* 8.6* 8.4*  HCT 25.6* 25.2* 31.6* 29.5* 29.3*  PLT 105* 131* 201 263 368  MCV 74.9* 75.4* 75.6* 76.2* 77.1*  MCH 21.6* 21.0* 22.0* 22.2* 22.1*  MCHC 28.9* 27.8* 29.1* 29.2* 28.7*  RDW 21.2* 21.1* 20.6* 21.2* 21.8*  LYMPHSABS  --   --  1.4 1.8 2.3  MONOABS  --   --  0.5 0.4 0.6  EOSABS  --   --  0.1 0.0 0.0  BASOSABS  --   --  0.0 0.0 0.0    Recent Labs  Lab 12/04/23 2000 12/05/23 0500 12/05/23 0500 12/06/23 0400 12/07/23 0337 12/08/23 0343 12/08/23 0942 12/09/23 0500 12/10/23 0405 12/11/23 0349  NA  --  140   < > 136 136 138  --  138 136 139  K  --  3.3*   < > 3.5 3.1* 3.6  --  3.9 3.3* 4.3  CL  --  102   < > 99 101 107  --  107 107 108  CO2  --  32   < > 28 28 23   --  21* 22 24  ANIONGAP  --  6   < > 9 7 8   --  10 7 7   GLUCOSE  --  182*   < > 166* 154* 150*  --  209* 307* 150*  BUN  --  10   < > 9 7 5*  --  <5* 8 8  CREATININE  --  0.42*   < > 0.38* 0.47 0.36*  --  0.47 0.62 0.47  CRP  --   --   --   --   --   --  1.3* 0.8 0.7 0.6  PROCALCITON  --  0.87  --   --   --   --  0.12 <0.10 <0.10 <0.10  BNP  --   --   --   --   --   --  135.1* 33.4 33.8 55.2  MG  --  2.1   < > 1.9 2.0 2.0  --  2.2 2.1 2.0  PHOS 3.5 3.3  --  2.9 4.7* 3.5  --   --   --   --   CALCIUM   --  8.3*   < > 8.7* 8.2* 8.3*  --  8.3* 8.4* 8.3*   < > = values in this interval not displayed.      Recent Labs  Lab 12/05/23 0500 12/06/23 0400 12/07/23 0337 12/08/23 0343 12/08/23 0942 12/09/23 0500 12/10/23 0405 12/11/23 0349  CRP  --   --   --   --  1.3* 0.8 0.7 0.6  PROCALCITON 0.87  --   --   --  0.12 <0.10 <0.10 <0.10  BNP  --   --   --   --  135.1* 33.4 33.8 55.2  MG 2.1   < > 2.0 2.0  --  2.2 2.1 2.0  CALCIUM  8.3*   < > 8.2* 8.3*  --  8.3* 8.4* 8.3*   < > = values in this interval not displayed.    --------------------------------------------------------------------------------------------------------------- Lab Results   Component Value Date   CHOL 141 11/05/2023   HDL 88 11/05/2023   LDLCALC 45 11/05/2023   TRIG 26 12/03/2023   CHOLHDL 1.6 11/05/2023    Lab Results  Component Value Date   HGBA1C 6.5 (H) 11/05/2023   Radiology Report DG Chest Port 1 View Result Date: 12/11/2023 CLINICAL DATA:  58 year old female with shortness of breath and rib pain. Lower lobe pneumonia, sepsis. EXAM: PORTABLE CHEST 1 VIEW COMPARISON:  Portable chest 12/09/2023 and earlier. FINDINGS: Portable AP semi upright view at 0557 hours. Stable low lung volumes. Stable left IJ vascular catheter. Right greater than left lung base hypo ventilation has not significantly changed since 12/08/2023. Small pleural effusions are possible. No superimposed pneumothorax, pulmonary edema. No new lung opacity. Stable visualized osseous structures.  Negative visible bowel gas. IMPRESSION: Right greater than left lung base opacity, low lung volumes stable since 12/08/2023. Suspect unresolved consolidation/pneumonia. Small if any pleural effusions. No new cardiopulmonary abnormality. Electronically Signed   By: Marlise Simpers M.D.   On: 12/11/2023 06:22   DG Chest Port 1 View Result Date: 12/09/2023 CLINICAL DATA:  141880 SOB (shortness of breath) [id number] EXAM: PORTABLE CHEST 1 VIEW COMPARISON:  Chest x-ray 12/08/2023 FINDINGS: Left internal jugular central venous catheter with tip overlying the superior cava junction. The heart and mediastinal contours are within normal limits. Low lung volumes. Persistent right base airspace opacity. Question developing patchy airspace opacities bilaterally. Trace right pleural effusion. No pulmonary edema. No left pleural effusion. No pneumothorax. No acute osseous abnormality. IMPRESSION: Low lung volumes. Persistent right base airspace opacity. Question developing patchy airspace opacities bilaterally. Trace right pleural effusion. Recommend repeat chest x-ray PA lateral view for further evaluation. Electronically Signed    By: Morgane  Naveau M.D.   On: 12/09/2023 18:35     Signature  -   Lynnwood Sauer M.D on 12/11/2023 at 8:29 AM   -  To page go to [email or web address]

## 2023-12-11 NOTE — Progress Notes (Signed)
@   2355 -  Patient had 15 beat run of vtach. Patient asymptomatic except angry about running out of snacks.   @ 0003 - Notified Dr. Alla Isaacs and she said to give patient PRN haldol  and obtain an EKG.   EKG showed NSR. Patient asymptomatic.

## 2023-12-12 DIAGNOSIS — A419 Sepsis, unspecified organism: Secondary | ICD-10-CM | POA: Diagnosis not present

## 2023-12-12 DIAGNOSIS — R6520 Severe sepsis without septic shock: Secondary | ICD-10-CM | POA: Diagnosis not present

## 2023-12-12 DIAGNOSIS — E872 Acidosis, unspecified: Secondary | ICD-10-CM | POA: Diagnosis not present

## 2023-12-12 LAB — TYPE AND SCREEN
ABO/RH(D): A POS
Antibody Screen: POSITIVE
Donor AG Type: NEGATIVE
Donor AG Type: NEGATIVE
Unit division: 0
Unit division: 0

## 2023-12-12 LAB — GLUCOSE, CAPILLARY
Glucose-Capillary: 164 mg/dL — ABNORMAL HIGH (ref 70–99)
Glucose-Capillary: 269 mg/dL — ABNORMAL HIGH (ref 70–99)
Glucose-Capillary: 214 mg/dL — ABNORMAL HIGH (ref 70–99)
Glucose-Capillary: 255 mg/dL — ABNORMAL HIGH (ref 70–99)

## 2023-12-12 LAB — BASIC METABOLIC PANEL WITH GFR
Anion gap: 9 (ref 5–15)
BUN: 8 mg/dL (ref 6–20)
CO2: 24 mmol/L (ref 22–32)
Calcium: 9.3 mg/dL (ref 8.9–10.3)
Chloride: 103 mmol/L (ref 98–111)
Creatinine, Ser: 0.5 mg/dL (ref 0.44–1.00)
GFR, Estimated: >60 mL/min (ref >60)
Glucose, Bld: 153 mg/dL — ABNORMAL HIGH (ref 70–99)
Potassium: 3.8 mmol/L (ref 3.5–5.1)
Sodium: 136 mmol/L (ref 135–145)

## 2023-12-12 LAB — CBC WITH DIFFERENTIAL/PLATELET
Abs Immature Granulocytes: 0.06 10*3/uL (ref 0.00–0.07)
Basophils Absolute: 0.1 10*3/uL (ref 0.0–0.1)
Basophils Relative: 1 %
Eosinophils Absolute: 0.1 10*3/uL (ref 0.0–0.5)
Eosinophils Relative: 1 %
HCT: 31.1 % — ABNORMAL LOW (ref 36.0–46.0)
Hemoglobin: 9.4 g/dL — ABNORMAL LOW (ref 12.0–15.0)
Immature Granulocytes: 1 %
Lymphocytes Relative: 29 %
Lymphs Abs: 3.2 10*3/uL (ref 0.7–4.0)
MCH: 22.8 pg — ABNORMAL LOW (ref 26.0–34.0)
MCHC: 30.2 g/dL (ref 30.0–36.0)
MCV: 75.3 fL — ABNORMAL LOW (ref 80.0–100.0)
Monocytes Absolute: 0.8 10*3/uL (ref 0.1–1.0)
Monocytes Relative: 7 %
Neutro Abs: 6.7 10*3/uL (ref 1.7–7.7)
Neutrophils Relative %: 61 %
Platelets: 534 10*3/uL — ABNORMAL HIGH (ref 150–400)
RBC: 4.13 MIL/uL (ref 3.87–5.11)
RDW: 22.7 % — ABNORMAL HIGH (ref 11.5–15.5)
WBC: 10.9 10*3/uL — ABNORMAL HIGH (ref 4.0–10.5)
nRBC: 0 % (ref 0.0–0.2)

## 2023-12-12 LAB — BPAM RBC
Blood Product Expiration Date: 202507012359
ISSUE DATE / TIME: 202506180111
Unit Type and Rh: 600
Unit Type and Rh: 6200

## 2023-12-12 LAB — BRAIN NATRIURETIC PEPTIDE: B Natriuretic Peptide: 95.6 pg/mL (ref 0.0–100.0)

## 2023-12-12 LAB — MAGNESIUM: Magnesium: 1.9 mg/dL (ref 1.7–2.4)

## 2023-12-12 MED ORDER — LIVING WELL WITH DIABETES BOOK
Freq: Once | Status: DC
  Filled 2023-12-12: qty 1

## 2023-12-12 MED ORDER — CLONAZEPAM 0.5 MG PO TBDP
0.5000 mg | ORAL_TABLET | Freq: Two times a day (BID) | ORAL | Status: DC
  Administered 2023-12-12 (×2): 0.5 mg via ORAL
  Filled 2023-12-12 (×3): qty 1

## 2023-12-12 MED ORDER — MAGNESIUM SULFATE 2 GM/50ML IV SOLN
2.0000 g | Freq: Once | INTRAVENOUS | Status: AC
  Administered 2023-12-12: 2 g via INTRAVENOUS
  Filled 2023-12-12: qty 50

## 2023-12-12 MED ORDER — POTASSIUM CHLORIDE CRYS ER 20 MEQ PO TBCR
40.0000 meq | EXTENDED_RELEASE_TABLET | Freq: Once | ORAL | Status: AC
  Administered 2023-12-12: 40 meq via ORAL
  Filled 2023-12-12: qty 2

## 2023-12-12 MED ORDER — TRIPLE ANTIBIOTIC 3.5-400-5000 EX OINT
1.0000 | TOPICAL_OINTMENT | Freq: Three times a day (TID) | CUTANEOUS | Status: DC
  Administered 2023-12-12 (×3): 1 via CUTANEOUS
  Filled 2023-12-12 (×4): qty 1

## 2023-12-12 MED ORDER — LEVETIRACETAM (KEPPRA) 500 MG/5 ML ADULT IV PUSH
500.0000 mg | Freq: Two times a day (BID) | INTRAVENOUS | Status: DC
  Administered 2023-12-12 (×2): 500 mg via INTRAVENOUS
  Filled 2023-12-12 (×3): qty 5

## 2023-12-12 MED ORDER — HALOPERIDOL LACTATE 5 MG/ML IJ SOLN
2.0000 mg | Freq: Once | INTRAMUSCULAR | Status: AC
  Administered 2023-12-12: 2 mg via INTRAMUSCULAR
  Filled 2023-12-12: qty 1

## 2023-12-12 MED ORDER — LIDOCAINE 5 % EX PTCH
1.0000 | MEDICATED_PATCH | CUTANEOUS | Status: DC
  Administered 2023-12-12 – 2023-12-13 (×2): 1 via TRANSDERMAL
  Filled 2023-12-12 (×2): qty 1

## 2023-12-12 NOTE — Inpatient Diabetes Management (Addendum)
 Inpatient Diabetes Program Recommendations  AACE/ADA: New Consensus Statement on Inpatient Glycemic Control (2015)  Target Ranges:  Prepandial:   less than 140 mg/dL      Peak postprandial:   less than 180 mg/dL (1-2 hours)      Critically ill patients:  140 - 180 mg/dL   Lab Results  Component Value Date   GLUCAP 164 (H) 12/12/2023   HGBA1C 6.5 (H) 11/05/2023    Review of Glycemic Control  Diabetes history: DM2 Outpatient Diabetes medications: Metformin  750 mg daily Current orders for Inpatient glycemic control: Novolog  0-15 units correction scale TID  Inpatient Diabetes Program Recommendations:   Spoke with patient on the phone. Diabetes coordinator is working remotely for the weekend.  Patient states that she was diagnosed with diabetes a long time ago. States that her husband died last year. She states that she does not take her Metformin  every day. 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 She does not check blood sugars every day. Her daughter, [name], is her POA. Patient stated that she would like me to talk with her on the phone as follow up. Noted that HgbA1C is 6.5%, so Metformin  would be the medication of choice at this point.   Spoke with daughter, [name], on the phone about her mother and her care at home. She stated that her father died this year in 2023-10-05 of a heart attack at her mother's home, but did not live consistently with her mother. Daughter states that her mother does get confused on timing. Her mother lives alone and usually can go several days without eating, but tends to drink beer instead. She does not like cooked foods, only food from takeout. She eats a lot of meats and does not eat many vegetables and just a few fruits. Daughter does not provide her with alcohol . Patient gets it from other people.  Patient requests what she wants to eat.   Will order Living Well with Diabetes booklet for patient to take home. Daughter is trying hard  for her mother to follow a better meal plan and to take her medicine, but it has been very difficult.   Will continue to monitor blood sugars while in the hospital. It might be helpful to have HH or someone to come in to the home to check on her, get her medications organized for her to take on her own, and to get her to walk and gain strength, other than family members.   Marjorie Lunger RN BSN CDE Diabetes Coordinator Pager: [phone]  8am-5pm

## 2023-12-12 NOTE — Plan of Care (Signed)
  Problem: Fluid Volume: Goal: Hemodynamic stability will improve Outcome: Progressing   Problem: Clinical Measurements: Goal: Diagnostic test results will improve Outcome: Progressing Goal: Signs and symptoms of infection will decrease Outcome: Progressing   Problem: Respiratory: Goal: Ability to maintain adequate ventilation will improve Outcome: Progressing   Problem: Education: Goal: Ability to describe self-care measures that may prevent or decrease complications (Diabetes Survival Skills Education) will improve Outcome: Progressing Goal: Individualized Educational Video(s) Outcome: Progressing   Problem: Coping: Goal: Ability to adjust to condition or change in health will improve Outcome: Progressing   Problem: Fluid Volume: Goal: Ability to maintain a balanced intake and output will improve Outcome: Progressing   Problem: Health Behavior/Discharge Planning: Goal: Ability to identify and utilize available resources and services will improve Outcome: Progressing Goal: Ability to manage health-related needs will improve Outcome: Progressing   Problem: Metabolic: Goal: Ability to maintain appropriate glucose levels will improve Outcome: Progressing   Problem: Nutritional: Goal: Maintenance of adequate nutrition will improve Outcome: Progressing Goal: Progress toward achieving an optimal weight will improve Outcome: Progressing   Problem: Skin Integrity: Goal: Risk for impaired skin integrity will decrease Outcome: Progressing   Problem: Tissue Perfusion: Goal: Adequacy of tissue perfusion will improve Outcome: Progressing   Problem: Education: Goal: Knowledge of General Education information will improve Description: Including pain rating scale, medication(s)/side effects and non-pharmacologic comfort measures Outcome: Progressing   Problem: Health Behavior/Discharge Planning: Goal: Ability to manage health-related needs will improve Outcome:  Progressing   Problem: Clinical Measurements: Goal: Ability to maintain clinical measurements within normal limits will improve Outcome: Progressing Goal: Will remain free from infection Outcome: Progressing Goal: Diagnostic test results will improve Outcome: Progressing Goal: Respiratory complications will improve Outcome: Progressing Goal: Cardiovascular complication will be avoided Outcome: Progressing   Problem: Activity: Goal: Risk for activity intolerance will decrease Outcome: Progressing   Problem: Nutrition: Goal: Adequate nutrition will be maintained Outcome: Progressing   Problem: Coping: Goal: Level of anxiety will decrease Outcome: Progressing   Problem: Elimination: Goal: Will not experience complications related to bowel motility Outcome: Progressing Goal: Will not experience complications related to urinary retention Outcome: Progressing   Problem: Pain Managment: Goal: General experience of comfort will improve and/or be controlled Outcome: Progressing   Problem: Safety: Goal: Ability to remain free from injury will improve Outcome: Progressing   Problem: Skin Integrity: Goal: Risk for impaired skin integrity will decrease Outcome: Progressing   Problem: Safety: Goal: Non-violent Restraint(s) Outcome: Progressing

## 2023-12-12 NOTE — Progress Notes (Addendum)
 PROGRESS NOTE                                                                                                                                                                                                             Patient Demographics:    [name], is a 58 y.o. female, DOB - Apr 25, 1966, FMW:[id number]  Outpatient Primary MD for the patient is Patient, No Pcp Per    LOS - 11  Admit date - 12/01/2023    Chief Complaint  Patient presents with   RIB CAGE PAIN       Brief Narrative (HPI from H&P)   58 y.o. female with medical history significant for COPD, seizure disorder, iron  deficiency anemia, depression/anxiety/schizophrenia, polysubstance use (EtOH, cocaine, tobacco), medical nonadherence and frequent ED visits who is admitted with severe sepsis due to right lower lobe pneumonia.  She was initially intubated and kept in ICU, stabilized and transferred to my care on 12/08/2023 on day 7 of her hospital stay.   Significant Hospital Events: Including procedures, antibiotic start and stop dates in addition to other pertinent events   Multiple ER visits for various complaints noted, 20+ this year 6/11 intubated lined pressors bronch/BAL  6/12 weaning pressors, changing sedation 6/13 cortrak, hopeful extubation        Subjective:   Patient in bed, appears comfortable, denies any headache, no fever, no chest pain or pressure, no shortness of breath , no abdominal pain. No new focal weakness.    Assessment  & Plan :    RLL PNA with sepsis present on admission  -acutely worsened on presentation, requiring intubation and transfer to ICU on 6/11. Later extubated 6/13 -completed 5 days of vanc/zosyn  as of 6/16 -Seems to be improved since admit -Leukocytosis normalized - Courage to sit in chair use I-S and flutter valve for pulmonary toiletry, advance activity and monitor.  He knows clinical signs of pneumonia.    ETOH abuse with DTs with anxiety - Mild DTs, was on CIWA protocol, add low-dose Seroquel , Klonopin  and monitor.  Avoiding frequent Haldol  or increasing Seroquel  as she had a run of V. tach   Hypotension.  Morning of 12/09/2023.  Hydrate, midodrine , TED stockings & monitor.    Right nipple.  Puslike discharge.  No surrounding cellulitis or tenderness, no palpable mass, antibiotic cream with outpatient OB follow-up.  COPD -cont with current bronchodilators -wean off steroids as tolerated   Hx seizures -stable thus far -cont AED's   FE deficiency anemia - Chronic with acute fall due to heme dilution and given blood draws, after consent 2 units of packed RBC of 12/08/2023.  Discussed with patient and her daughter.   Asymptomatic nonsustained V. tach around when patient was agitated early morning 12/11/2023.  EF over 50, electrolytes stable. Minimize V. tach causing medications, add low-dose beta-blocker if blood pressure allows.  Continue to monitor, she is symptom-free.  Depression/Anxiety/Schizophrenia -seen by Psychiatry.  -recs for depakote  500mg  BID with ativan  PRN, cont abilify  10mg  every day, remeron  at bedtime 7.5mg    Diet controlled DM type II.  Steroid-induced high sugars.  Cover with ISS and monitor.  A1c 6.5, PCP to monitor postdischarge.  Will provide diabetic and insulin  education while she is here.  CBG (last 3)  Recent Labs    12/11/23 1153 12/11/23 1548 12/11/23 2004  GLUCAP 278* 242* 88   Lab Results  Component Value Date   HGBA1C 6.5 (H) 11/05/2023         Condition - Extremely Guarded  Family Communication  : [name] [phone]  on 12/08/2023  Code Status :  Full  Consults  :  PCCM  PUD Prophylaxis :    Procedures  :            Disposition Plan  :    Status is: Inpatient   DVT Prophylaxis  :    Place TED hose Start: 12/10/23 0954 enoxaparin  (LOVENOX ) injection 40 mg Start: 12/02/23 0200    Lab Results  Component Value  Date   PLT 534 (H) 12/12/2023    Diet :  Diet Order             Diet Carb Modified Fluid consistency: Thin; Room service appropriate? Yes  Diet effective now                    Inpatient Medications  Scheduled Meds:  sodium chloride    Intravenous Once   arformoterol   15 mcg Nebulization BID   budesonide  (PULMICORT ) nebulizer solution  0.25 mg Nebulization BID   Chlorhexidine  Gluconate Cloth  6 each Topical Daily   clonazepam   0.5 mg Oral BID   divalproex   250 mg Oral Q12H   docusate sodium   100 mg Oral BID   enoxaparin  (LOVENOX ) injection  40 mg Subcutaneous Daily   folic acid   1 mg Oral Daily   guaiFENesin   20 mL Oral Q8H   insulin  aspart  0-15 Units Subcutaneous TID WC   levETIRAcetam   500 mg Intravenous Q12H   lidocaine   1 patch Transdermal Q24H   metoprolol  tartrate  25 mg Oral BID   midodrine   10 mg Oral TID WC   multivitamin with minerals  1 tablet Oral Daily   neomycin -bacitracin -polymyxin  1 Application Apply externally TID   nicotine   21 mg Transdermal Daily   pantoprazole   40 mg Oral Daily   polyethylene glycol  17 g Oral Daily   predniSONE   10 mg Oral Q breakfast   QUEtiapine   25 mg Oral BID   revefenacin   175 mcg Nebulization Daily   sodium chloride  flush  10-40 mL Intracatheter Q12H   thiamine   100 mg Oral Daily   zonisamide   200 mg Oral Daily   Continuous Infusions:  promethazine  (PHENERGAN ) injection (IM or IVPB)      PRN Meds:.acetaminophen  **OR** acetaminophen , diltiazem , Gerhardt's butt cream, ipratropium-albuterol ,  loperamide , mouth rinse, promethazine  (PHENERGAN ) injection (IM or IVPB), senna-docusate    Objective:   Vitals:   12/11/23 2003 12/11/23 2219 12/11/23 2344 12/12/23 0400  BP: 133/80 133/80 136/69 128/69  Pulse: 79 79 83 91  Resp: 18  18 17   Temp: 99.1 F (37.3 C)  97.9 F (36.6 C) 97.8 F (36.6 C)  TempSrc: Oral  Oral Oral  SpO2: 95%  97% 99%  Weight:      Height:        Wt Readings from Last 3 Encounters:   12/11/23 55.9 kg  12/01/23 58 kg  11/30/23 58 kg     Intake/Output Summary (Last 24 hours) at 12/12/2023 0753 Last data filed at 12/11/2023 2227 Gross per 24 hour  Intake 1436.61 ml  Output 600 ml  Net 836.61 ml     Physical Exam  Awake but mildly confused, No new F.N deficits, Normal affect, left IJ central line [place].AT,PERRAL Supple Neck, No JVD,   Symmetrical Chest wall movement, Good air movement bilaterally, CTAB RRR,No Gallops,Rubs or new Murmurs,  +ve B.Sounds, Abd Soft, No tenderness,   No Cyanosis, Clubbing or edema        Data Review:    Recent Labs  Lab 12/08/23 0343 12/09/23 0500 12/10/23 0405 12/11/23 0349 12/12/23 0427  WBC 5.7 4.2 6.0 7.8 10.9*  HGB 7.0* 9.2* 8.6* 8.4* 9.4*  HCT 25.2* 31.6* 29.5* 29.3* 31.1*  PLT 131* 201 263 368 534*  MCV 75.4* 75.6* 76.2* 77.1* 75.3*  MCH 21.0* 22.0* 22.2* 22.1* 22.8*  MCHC 27.8* 29.1* 29.2* 28.7* 30.2  RDW 21.1* 20.6* 21.2* 21.8* 22.7*  LYMPHSABS  --  1.4 1.8 2.3 3.2  MONOABS  --  0.5 0.4 0.6 0.8  EOSABS  --  0.1 0.0 0.0 0.1  BASOSABS  --  0.0 0.0 0.0 0.1    Recent Labs  Lab 12/06/23 0400 12/07/23 0337 12/08/23 0343 12/08/23 0942 12/09/23 0500 12/10/23 0405 12/11/23 0349 12/12/23 0427  NA 136 136 138  --  138 136 139 136  K 3.5 3.1* 3.6  --  3.9 3.3* 4.3 3.8  CL 99 101 107  --  107 107 108 103  CO2 28 28 23   --  21* 22 24 24   ANIONGAP 9 7 8   --  10 7 7 9   GLUCOSE 166* 154* 150*  --  209* 307* 150* 153*  BUN 9 7 5*  --  <5* 8 8 8   CREATININE 0.38* 0.47 0.36*  --  0.47 0.62 0.47 0.50  CRP  --   --   --  1.3* 0.8 0.7 0.6  --   PROCALCITON  --   --   --  0.12 <0.10 <0.10 <0.10  --   BNP  --   --   --  135.1* 33.4 33.8 55.2 95.6  MG 1.9 2.0 2.0  --  2.2 2.1 2.0 1.9  PHOS 2.9 4.7* 3.5  --   --   --   --   --   CALCIUM  8.7* 8.2* 8.3*  --  8.3* 8.4* 8.3* 9.3      Recent Labs  Lab 12/08/23 0343 12/08/23 0942 12/09/23 0500 12/10/23 0405 12/11/23 0349 12/12/23 0427  CRP  --  1.3* 0.8 0.7 0.6   --   PROCALCITON  --  0.12 <0.10 <0.10 <0.10  --   BNP  --  135.1* 33.4 33.8 55.2 95.6  MG 2.0  --  2.2 2.1 2.0 1.9  CALCIUM  8.3*  --  8.3* 8.4* 8.3* 9.3    --------------------------------------------------------------------------------------------------------------- Lab Results  Component Value Date   CHOL 141 11/05/2023   HDL 88 11/05/2023   LDLCALC 45 11/05/2023   TRIG 26 12/03/2023   CHOLHDL 1.6 11/05/2023    Lab Results  Component Value Date   HGBA1C 6.5 (H) 11/05/2023   Radiology Report DG Chest Port 1 View Result Date: 12/11/2023 CLINICAL DATA:  58 year old female with shortness of breath and rib pain. Lower lobe pneumonia, sepsis. EXAM: PORTABLE CHEST 1 VIEW COMPARISON:  Portable chest 12/09/2023 and earlier. FINDINGS: Portable AP semi upright view at 0557 hours. Stable low lung volumes. Stable left IJ vascular catheter. Right greater than left lung base hypo ventilation has not significantly changed since 12/08/2023. Small pleural effusions are possible. No superimposed pneumothorax, pulmonary edema. No new lung opacity. Stable visualized osseous structures.  Negative visible bowel gas. IMPRESSION: Right greater than left lung base opacity, low lung volumes stable since 12/08/2023. Suspect unresolved consolidation/pneumonia. Small if any pleural effusions. No new cardiopulmonary abnormality. Electronically Signed   By: VEAR Hurst M.D.   On: 12/11/2023 06:22     Signature  -   Lavada Stank M.D on 12/12/2023 at 7:53 AM   -  To page go to [email or web address]

## 2023-12-12 NOTE — Progress Notes (Signed)
 Mobility Specialist Progress Note:    12/12/23 1100  Mobility  Activity Ambulated with assistance in hallway  Level of Assistance Contact guard assist, steadying assist  Assistive Device None  Distance Ambulated (ft) 500 ft  Activity Response Tolerated well  Mobility Referral Yes  Mobility visit 1 Mobility  Mobility Specialist Start Time (ACUTE ONLY) 1015  Mobility Specialist Stop Time (ACUTE ONLY) 1025  Mobility Specialist Time Calculation (min) (ACUTE ONLY) 10 min   Received pt in bed having no complaints and agreeable to mobility. Pt was asymptomatic throughout ambulation and returned to room w/o fault. Left in chair w/ call bell in reach and all needs met.   D'Vante Nicholaus Mobility Specialist Please contact via Special educational needs teacher or Rehab office at [phone]

## 2023-12-13 DIAGNOSIS — E872 Acidosis, unspecified: Secondary | ICD-10-CM | POA: Diagnosis not present

## 2023-12-13 DIAGNOSIS — A419 Sepsis, unspecified organism: Secondary | ICD-10-CM | POA: Diagnosis not present

## 2023-12-13 DIAGNOSIS — R6520 Severe sepsis without septic shock: Secondary | ICD-10-CM | POA: Diagnosis not present

## 2023-12-13 LAB — BASIC METABOLIC PANEL WITH GFR
Anion gap: 6 (ref 5–15)
BUN: 9 mg/dL (ref 6–20)
CO2: 24 mmol/L (ref 22–32)
Calcium: 8.3 mg/dL — ABNORMAL LOW (ref 8.9–10.3)
Chloride: 105 mmol/L (ref 98–111)
Creatinine, Ser: 0.51 mg/dL (ref 0.44–1.00)
GFR, Estimated: >60 mL/min (ref >60)
Glucose, Bld: 180 mg/dL — ABNORMAL HIGH (ref 70–99)
Potassium: 3.7 mmol/L (ref 3.5–5.1)
Sodium: 135 mmol/L (ref 135–145)

## 2023-12-13 LAB — CBC WITH DIFFERENTIAL/PLATELET
Abs Immature Granulocytes: 0.02 10*3/uL (ref 0.00–0.07)
Basophils Absolute: 0 10*3/uL (ref 0.0–0.1)
Basophils Relative: 1 %
Eosinophils Absolute: 0.1 10*3/uL (ref 0.0–0.5)
Eosinophils Relative: 1 %
HCT: 28.4 % — ABNORMAL LOW (ref 36.0–46.0)
Hemoglobin: 8.5 g/dL — ABNORMAL LOW (ref 12.0–15.0)
Immature Granulocytes: 0 %
Lymphocytes Relative: 33 %
Lymphs Abs: 2.2 10*3/uL (ref 0.7–4.0)
MCH: 22.8 pg — ABNORMAL LOW (ref 26.0–34.0)
MCHC: 29.9 g/dL — ABNORMAL LOW (ref 30.0–36.0)
MCV: 76.1 fL — ABNORMAL LOW (ref 80.0–100.0)
Monocytes Absolute: 0.7 10*3/uL (ref 0.1–1.0)
Monocytes Relative: 10 %
Neutro Abs: 3.8 10*3/uL (ref 1.7–7.7)
Neutrophils Relative %: 55 %
Platelets: 527 10*3/uL — ABNORMAL HIGH (ref 150–400)
RBC: 3.73 MIL/uL — ABNORMAL LOW (ref 3.87–5.11)
RDW: 22.5 % — ABNORMAL HIGH (ref 11.5–15.5)
WBC: 6.8 10*3/uL (ref 4.0–10.5)
nRBC: 0 % (ref 0.0–0.2)

## 2023-12-13 LAB — GLUCOSE, CAPILLARY: Glucose-Capillary: 381 mg/dL — ABNORMAL HIGH (ref 70–99)

## 2023-12-13 MED ORDER — LORAZEPAM 2 MG/ML IJ SOLN
1.0000 mg | Freq: Once | INTRAMUSCULAR | Status: AC
  Administered 2023-12-13: 1 mg via INTRAVENOUS
  Filled 2023-12-13: qty 1

## 2023-12-13 MED ORDER — PANTOPRAZOLE SODIUM 40 MG PO TBEC: 40.0000 mg | DELAYED_RELEASE_TABLET | Freq: Every day | ORAL | 0 refills | Status: DC

## 2023-12-13 MED ORDER — METOPROLOL TARTRATE 25 MG PO TABS: 25.0000 mg | ORAL_TABLET | Freq: Two times a day (BID) | ORAL | 0 refills | Status: DC

## 2023-12-13 MED ORDER — METFORMIN HCL ER 750 MG PO TB24: 750.0000 mg | ORAL_TABLET | Freq: Every day | ORAL | 0 refills | Status: DC

## 2023-12-13 MED ORDER — TRIPLE ANTIBIOTIC 3.5-400-5000 EX OINT: 1.0000 | TOPICAL_OINTMENT | Freq: Three times a day (TID) | CUTANEOUS | 0 refills | Status: DC

## 2023-12-13 MED ORDER — METOPROLOL TARTRATE 25 MG PO TABS: 25.0000 mg | ORAL_TABLET | Freq: Two times a day (BID) | ORAL | Status: DC

## 2023-12-13 MED ORDER — FOLIC ACID 1 MG PO TABS: 1.0000 mg | ORAL_TABLET | Freq: Every day | ORAL | 0 refills | Status: DC

## 2023-12-13 MED ORDER — NICOTINE 21 MG/24HR TD PT24: 21.0000 mg | MEDICATED_PATCH | Freq: Every day | TRANSDERMAL | 0 refills | Status: DC

## 2023-12-13 NOTE — Progress Notes (Signed)
 DISCHARGE NOTE HOME [name] to be discharged Home per MD order. Discussed prescriptions and follow up appointments with the patient. Prescriptions given to patient; medication list explained in detail. Patient verbalized understanding.  Skin clean, dry and intact without evidence of skin break down, no evidence of skin tears noted. IV catheter discontinued intact. Site without signs and symptoms of complications. Dressing and pressure applied. Pt denies pain at the site currently. No complaints noted.  See Lda for right breast Patient free of other lines, drains, and wounds.   An After Visit Summary (AVS) was printed and given to the patient. Patient escorted via wheelchair, and discharged home via private auto.  Peyton SHAUNNA Pepper, RN

## 2023-12-13 NOTE — Progress Notes (Signed)
CVAD removed per protocol per MD order. Manual pressure applied for 10 mins. Vaseline gauze, gauze, and Tegaderm applied over insertion site. No bleeding or swelling noted. Instructed patient to remain in bed for thirty mins. Educated patient about S/S of infection and when to call MD; keep dressing dry and intact for 24 hours. Pt verbalized comprehension.

## 2023-12-13 NOTE — Discharge Summary (Signed)
 [name] FMW:[id number] DOB: [birth year] DOA: 12/01/2023  PCP: Patient, No Pcp Per  Admit date: 12/01/2023  Discharge date: 12/13/2023  Admitted From: Home   Disposition:  Home - refused placement   Recommendations for Outpatient Follow-up:   Follow up with PCP in 1-2 weeks  PCP Please obtain BMP/CBC, 2 view CXR in 1week,  (see Discharge instructions)   PCP Please follow up on the following pending results: Needs follow-up with OB physician for right nipple discharge, follow CBC, BMP magnesium  and two-view chest x-ray in 7 to 10 days   Home Health: PT   Equipment/Devices: None  Consultations: PCCM Discharge Condition: Stable    CODE STATUS: Full    Diet Recommendation: Heart Healthy, low carbohydrate     Chief Complaint  Patient presents with   RIB CAGE PAIN     Brief history of present illness from the day of admission and additional interim summary    58 y.o. female with medical history significant for COPD, seizure disorder, iron  deficiency anemia, depression/anxiety/schizophrenia, polysubstance use (EtOH, cocaine, tobacco), medical nonadherence and frequent ED visits who is admitted with severe sepsis due to right lower lobe pneumonia.  She was initially intubated and kept in ICU, stabilized and transferred to my care on 12/08/2023 on day 7 of her hospital stay.     Significant Hospital Events: Including procedures, antibiotic start and stop dates in addition to other pertinent events   Multiple ER visits for various complaints noted, 20+ this year 6/11 intubated lined pressors bronch/BAL  6/12 weaning pressors, changing sedation 6/13 cortrak, hopeful extubation                                                                      Hospital Course   RLL PNA with sepsis present on admission    -acutely worsened on presentation, requiring intubation and transfer to ICU on 6/11. Later extubated 6/13 -completed 5 days of vanc/zosyn  as of 6/16 - Now symptom-free back to room air, has finished antibiotic course, walking around in the hallway eager to be discharged home.  Will be discharged with outpatient PCP follow-up postdischarge   ETOH abuse with DTs with anxiety - Resolved with supportive care, currently not in DTs, anxiety much improved, strictly counseled to abstain from alcohol .   Right nipple.  Puslike discharge.  No surrounding cellulitis or tenderness, no palpable mass, antibiotic cream with outpatient OB follow-up, PCP to arrange patient given written instructions as well.     COPD - Will no acute issues continue home nebulizer treatments, currently stable on room air without any wheezing or crackles, note patient confirms she is chronically not on steroids.   Hx seizures -stable thus far -cont AED's   FE deficiency anemia - Chronic with acute fall due to heme  dilution and given blood draws, after consent 2 units of packed RBC of 12/08/2023.  Transfusion CBC stable.  Patient to follow-up with PCP for outpatient age-appropriate anemia workup .   Asymptomatic nonsustained V. tach around when patient was agitated early morning 12/11/2023.  EF over 50, electrolytes stable. Minimize V. tach causing medications, low-dose beta-blocker added as blood pressure is improved.  Has tolerated beta-blocker well.  No wheezing.   Depression/Anxiety/Schizophrenia - Seen by psychiatry, currently on home medications, she does not want to try any new psych medications upon discharge, wants to follow-up with PCP.   Diet controlled DM type II.  Steroid-induced high sugars.  Now off of steroids.  Continue Glucophage  upon discharge.  PCP to monitor.  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  Get a referral from your primary care physician.    Do not drive, operate heavy machinery, perform activities at heights, swimming or participation in water  activities or provide baby sitting services until you have seen by Primary MD or a Neurologist and advised to do so again.  Follow with Primary MD Patient, No Pcp Per in 7 days   Get CBC, CMP, magnesium , 2 view Chest X ray -  checked next visit with your primary MD   Activity: As tolerated with Full fall precautions use walker/cane & assistance as needed  Disposition Home    Diet: Heart Healthy low carbohydrate diet.  Special Instructions: If you have smoked or chewed Tobacco  in the last 2 yrs please stop smoking, stop any regular Alcohol   and or any Recreational drug use.  On your next visit with your primary care physician please Get Medicines reviewed and adjusted.  Please request your Prim.MD to go over all Hospital Tests and Procedure/Radiological results at the follow up, please get all Hospital records sent to your Prim MD by signing hospital release before you go home.  If you experience worsening of your admission symptoms, develop shortness of breath, life threatening emergency, suicidal or homicidal thoughts you must seek medical attention immediately by calling 911 or calling your MD immediately  if symptoms less severe.  You Must read complete instructions/literature along with all the possible adverse reactions/side effects for all the Medicines you take and that have been prescribed to  you. Take any new Medicines after you have completely understood and accpet all the possible adverse reactions/side effects.   Do not drive when taking Pain medications.  Do not take more than prescribed Pain, Sleep and Anxiety Medications  Wear Seat belts while driving.   Please note  You were cared for by a hospitalist during your hospital stay. If you have any questions about your discharge medications or the care you received while you were in the hospital after you are discharged, you can call the unit and asked to speak with the hospitalist on call if the hospitalist that took care of you is not  available. Once you are discharged, your primary care physician will handle any further medical issues. Please note that NO REFILLS for any discharge medications will be authorized once you are discharged, as it is imperative that you return to your primary care physician (or establish a relationship with a primary care physician if you do not have one) for your aftercare needs so that they can reassess your need for medications and monitor your lab values.   Increase activity slowly   Complete by: As directed    No wound care   Complete by: As directed        Discharge Medications   Allergies as of 12/13/2023       Reactions   Iron  Dextran Shortness Of Breath, Anxiety   Aspirin  Nausea And Vomiting, Other (See Comments)   Ok to take tylenol  or ibuprofen    Penicillins Other (See Comments)   Unknown reaction from childhood: family would like this to remain as an allergy. Tolerated cephalosporins and zosyn         Medication List     STOP taking these medications    predniSONE  20 MG tablet Commonly known as: DELTASONE        TAKE these medications    albuterol  108 (90 Base) MCG/ACT inhaler Commonly known as: VENTOLIN  HFA Inhale 2 puffs into the lungs every 4 (four) hours as needed for wheezing or shortness of breath.   ARIPiprazole  10 MG tablet Commonly known as:  ABILIFY  Take 1 tablet (10 mg total) by mouth daily.   divalproex  125 MG capsule Commonly known as: DEPAKOTE  SPRINKLE Take 2 capsules (250 mg total) by mouth every 12 (twelve) hours.   fluticasone  furoate-vilanterol 100-25 MCG/ACT Aepb Commonly known as: Breo Ellipta  Inhale 1 puff into the lungs daily.   folic acid  1 MG tablet Commonly known as: FOLVITE  Take 1 tablet (1 mg total) by mouth daily.   Incruse Ellipta  62.5 MCG/ACT Aepb Generic drug: umeclidinium bromide  Inhale 1 puff into the lungs daily.   ipratropium-albuterol  0.5-2.5 (3) MG/3ML Soln Commonly known as: DUONEB Take 3 mLs by nebulization every 6 (six) hours as needed (for shortness of breath or wheezing).   levETIRAcetam  500 MG 24 hr tablet Commonly known as: KEPPRA  XR Take 1 tablet (500 mg total) by mouth 2 (two) times daily.   metFORMIN  750 MG 24 hr tablet Commonly known as: GLUCOPHAGE -XR Take 1 tablet (750 mg total) by mouth daily with breakfast.   metoprolol  tartrate 25 MG tablet Commonly known as: LOPRESSOR  Take 1 tablet (25 mg total) by mouth 2 (two) times daily.   neomycin -bacitracin -polymyxin [phone] Oint Apply 1 Application topically 3 (three) times daily. Apply to your right nipple 3 times a day for 2 weeks   nicotine  21 mg/24hr patch Commonly known as: NICODERM CQ  - dosed in mg/24 hours Place 1 patch (21 mg total) onto the skin daily.   OXYGEN  Inhale 4-5 L/min into the lungs daily as needed (for COPD-related symptoms).   pantoprazole  40 MG tablet Commonly known as: PROTONIX  Take 1 tablet (40 mg total) by mouth daily.   thiamine  100 MG tablet Commonly known as: Vitamin B-1 Take 1 tablet (100 mg total) by mouth daily.   zonisamide  100 MG capsule Commonly known as: Zonegran  Take 2 capsules (200 mg total) by mouth daily.          Follow-up Information     [facility]. Schedule an appointment as soon as possible for a visit in 1 week(s).   Why:  Or  with your primary care physician within a week of discharge Contact information: [address] [employer] [address] [place]  [address] [phone]        [facility]. Schedule an appointment as soon as possible for a visit in 1 week(s).   Contact information: [address]     [address] [place]  [address] [phone]                Major procedures and Radiology Reports - PLEASE review detailed and final reports thoroughly  -      DG Chest [facility] 1 View Result Date: 12/11/2023 CLINICAL DATA:  58 year old female with shortness of breath and rib pain. Lower lobe pneumonia, sepsis. EXAM: PORTABLE CHEST 1 VIEW COMPARISON:  Portable chest 12/09/2023 and earlier. FINDINGS: Portable AP semi upright view at 0557 hours. Stable low lung volumes. Stable left IJ vascular catheter. Right greater than left lung base hypo ventilation has not significantly changed since 12/08/2023. Small pleural effusions are possible. No superimposed pneumothorax, pulmonary edema. No new lung opacity. Stable visualized osseous structures.  Negative visible bowel gas. IMPRESSION: Right greater than left lung base opacity, low lung volumes stable since 12/08/2023. Suspect unresolved consolidation/pneumonia. Small if any pleural effusions. No new cardiopulmonary abnormality. Electronically Signed   By: VEAR Hurst M.D.   On: 12/11/2023 06:22   DG Chest Port 1 View Result Date: 12/09/2023 CLINICAL DATA:  [id number] SOB (shortness of breath) [id number] EXAM: PORTABLE CHEST 1 VIEW COMPARISON:  Chest x-ray 12/08/2023 FINDINGS: Left internal jugular central venous catheter with tip overlying the superior cava junction. The heart and mediastinal contours are within normal limits. Low lung volumes. Persistent right base airspace opacity. Question developing patchy airspace opacities bilaterally. Trace right pleural effusion. No pulmonary edema. No left pleural effusion. No pneumothorax.  No acute osseous abnormality. IMPRESSION: Low lung volumes. Persistent right base airspace opacity. Question developing patchy airspace opacities bilaterally. Trace right pleural effusion. Recommend repeat chest x-ray PA lateral view for further evaluation. Electronically Signed   By: Morgane  Naveau M.D.   On: 12/09/2023 18:35   DG Chest Port 1 View Result Date: 12/08/2023 CLINICAL DATA:  58 year old female shortness of breath and back pain. Sepsis, right lung base pneumonia. EXAM: PORTABLE CHEST 1 VIEW COMPARISON:  Portable chest 12/04/2023 and earlier. FINDINGS: Portable AP semi upright view at 0626 hours. Extubated and enteric tube removed. Stable to mildly improved lung volumes from 4 days ago. Left IJ approach central line is stable. Stable cardiac size and mediastinal contours. Ongoing dense right lung base opacity, partially veiling. No other confluent lung opacity. No pneumothorax. No pulmonary edema. Visualized tracheal air column is within normal limits. Visible bowel-gas pattern within normal limits. Stable visualized osseous structures. IMPRESSION: 1. Extubated and enteric tube removed with mildly improved lung volumes. Stable left IJ central line. 2. Continued dense right lung base opacity, favor a combination of pleural fluid and unresolved consolidation. 3. No new cardiopulmonary abnormality. Electronically Signed   By: VEAR Hurst M.D.   On: 12/08/2023 06:38   DG Abd Portable 1V Result Date: 12/04/2023 CLINICAL DATA:  Enteric tube placement EXAM: PORTABLE ABDOMEN - 1 VIEW COMPARISON:  CT abdomen and pelvis dated 11/16/2023, abdominal radiograph dated 01/29/2023 FINDINGS: Gastric/enteric tube tip projects over the lateral left hemiabdomen. Left internal jugular venous catheter tip projects over the superior cavoatrial junction. A few gas-filled nondilated bowel loops in the upper abdomen. Small bilateral pleural effusions.  Dense right basilar opacity. IMPRESSION: Gastric/enteric tube tip  projects  over the lateral left hemiabdomen, likely within the proximal jejunum in this patient status post gastric bypass. Electronically Signed   By: Limin  Xu M.D.   On: 12/04/2023 15:34   DG Chest Port 1 View Result Date: 12/04/2023 CLINICAL DATA:  [id number] History of ETT [id number] EXAM: PORTABLE CHEST - 1 VIEW COMPARISON:  December 02, 2023 FINDINGS: Endotracheal tube terminates in the mid trachea. Left IJ approach central venous catheter terminates at the cavoatrial junction. Low lung volumes. Airspace consolidation in the right lung base. Patchy opacities in the left lung base. Blunting of both costophrenic sulci, likely due to small effusions. No pneumothorax. No cardiomegaly. IMPRESSION: 1. Similar positioning of the support tubes and lines, as described above. 2. Little significant interval change to the lungs. Electronically Signed   By: Rogelia Myers M.D.   On: 12/04/2023 09:42   CT Angio Chest Pulmonary Embolism (PE) W or WO Contrast Result Date: 12/02/2023 CLINICAL DATA:  Sepsis. Lactic acidosis. Concern for pulmonary embolism. EXAM: CT ANGIOGRAPHY CHEST WITH CONTRAST TECHNIQUE: Multidetector CT imaging of the chest was performed using the standard protocol during bolus administration of intravenous contrast. Multiplanar CT image reconstructions and MIPs were obtained to evaluate the vascular anatomy. RADIATION DOSE REDUCTION: This exam was performed according to the departmental dose-optimization program which includes automated exposure control, adjustment of the mA and/or kV according to patient size and/or use of iterative reconstruction technique. CONTRAST:  75mL OMNIPAQUE  IOHEXOL  350 MG/ML SOLN COMPARISON:  Chest CT dated 11/16/2023. FINDINGS: Cardiovascular: There is no cardiomegaly or pericardial effusion. Mild atherosclerotic calcification of the thoracic aorta. No aneurysmal dilatation or dissection. The origins of the great vessels of the aortic arch are patent. There is mild dilatation of the main  pulmonary trunk suggestive of pulmonary hypertension. No pulmonary artery embolus identified. Mediastinum/Nodes: No obvious hilar adenopathy. Evaluation however is limited due to consolidative changes of the lungs. Top-normal lymph node anterior to the carina measures 8 mm short axis. An enteric tube noted in the esophagus. No mediastinal fluid collection. Lungs/Pleura: Complete consolidation of the right lower and right middle lobes as well as near complete consolidation of the left lower lobe with air bronchogram. Findings may represent multifocal pneumonia. Underlying mass is not excluded clinical correlation and follow-up to resolution recommended. Faint ground-glass densities in the right upper lobe, likely infiltrate. Trace right pleural effusion. No pneumothorax. Endotracheal tube approximately 1 cm above the carina. The central airways remain patent. Upper Abdomen: Postsurgical changes of gastric bypass. Musculoskeletal: Osteopenia with degenerative changes of the spine. Compression fractures of T4 and T6 with fragmentation of the superior endplate as seen on the prior CT. MRI may provide better evaluation if there is clinical concern for an infectious process/osteomyelitis. No new fracture. Review of the MIP images confirms the above findings. IMPRESSION: 1. No CT evidence of pulmonary artery embolus. 2. Interval development of large areas of pulmonary consolidations, likely multifocal pneumonia. Clinical correlation and follow-up to resolution recommended. 3. Trace right pleural effusion. 4.  Aortic Atherosclerosis (ICD10-I70.0). Electronically Signed   By: Vanetta Chou M.D.   On: 12/02/2023 10:45   DG CHEST PORT 1 VIEW Result Date: 12/02/2023 CLINICAL DATA:  Intubation EXAM: PORTABLE CHEST 1 VIEW COMPARISON:  December 02, 2023, 5:59 a.m. FINDINGS: Comparison with prior examination there is no change in the position of the endotracheal tube the tip of which remains 1 cm from carina. The nasogastric tube  remains in anatomic position the tip in the stomach. Left IJ CVP line tip  in the atrium.  Cavoatrial junction. Bilateral pleural effusions right larger than left with bilateral pulmonary infiltrates that could correlate with bilateral congestive changes IMPRESSION: Infiltrates as described could correlate with congestive changes with bilateral pleural effusions Endotracheal tube in good position Electronically Signed   By: Franky Chard M.D.   On: 12/02/2023 09:44   DG Chest Port 1 View Result Date: 12/02/2023 CLINICAL DATA:  Shortness of breath with chest pain.  Sepsis. EXAM: PORTABLE CHEST 1 VIEW COMPARISON:  12/01/2023 FINDINGS: Interval progression of right base consolidative disease with probable right pleural effusion. Left lung clear. Cardiopericardial silhouette is at upper limits of normal for size. No acute bony abnormality. Telemetry leads overlie the chest. IMPRESSION: Interval progression of right base consolidative disease with probable right pleural effusion. Imaging features compatible with pneumonia. Close follow-up recommended to ensure resolution. Electronically Signed   By: Camellia Candle M.D.   On: 12/02/2023 06:20   DG Chest Portable 1 View Result Date: 12/01/2023 CLINICAL DATA:  Cough and fever EXAM: PORTABLE CHEST 1 VIEW COMPARISON:  Chest radiograph dated 11/30/2023 FINDINGS: Low lung volumes with bronchovascular crowding. New confluent right lower lobe opacity. No pleural effusion or pneumothorax. The heart size and mediastinal contours are within normal limits. No acute osseous abnormality. IMPRESSION: New confluent right lower lobe opacity, suspicious for pneumonia. Electronically Signed   By: Limin  Xu M.D.   On: 12/01/2023 21:37   DG Chest 1 View Result Date: 11/30/2023 CLINICAL DATA:  Dyspnea. EXAM: CHEST  1 VIEW COMPARISON:  11/27/2023 FINDINGS: Trace atelectasis noted left base. Lungs otherwise clear without focal pneumonia, pulmonary edema, or substantial pleural effusion.  The cardiopericardial silhouette is within normal limits for size. No acute bony abnormality. Telemetry leads overlie the chest. IMPRESSION: No active disease. Electronically Signed   By: Camellia Candle M.D.   On: 11/30/2023 06:34   DG Chest 1 View Result Date: 11/27/2023 CLINICAL DATA:  Shortness of breath. EXAM: CHEST  1 VIEW COMPARISON:  11/26/2023 FINDINGS: Heart size and mediastinal contours are normal. There is no pleural fluid, interstitial edema or airspace disease. Visualized osseous structures are unremarkable. IMPRESSION: No active disease. Electronically Signed   By: Waddell Calk M.D.   On: 11/27/2023 07:18   DG Chest Port 1 View Result Date: 11/26/2023 EXAM: 1 VIEW XRAY OF THE CHEST 11/26/2023 04:51:00 AM COMPARISON: 1 view chest x-ray 11/21/2023 CLINICAL HISTORY: Shortness of breath, copd; FINDINGS: LUNGS AND PLEURA: Lung volumes are low. No focal pulmonary opacity. No pulmonary edema. No pleural effusion. No pneumothorax. HEART AND MEDIASTINUM: No acute abnormality of the cardiac and mediastinal silhouettes. BONES AND SOFT TISSUES: No acute osseous abnormality. IMPRESSION: 1. No acute cardiopulmonary pathology. Electronically signed by: Lonni Necessary MD 11/26/2023 05:16 AM EDT RP Workstation: [id number]   DG Chest Port 1 View Result Date: 11/21/2023 EXAM: 1 VIEW(S) XRAY OF THE CHEST 11/21/2023 03:51:00 AM COMPARISON: 11/18/2023 . CLINICAL HISTORY: Shortness of breath. Reason for exam: shortness of breath. FINDINGS: LUNGS AND PLEURA: No focal pulmonary opacity. No pulmonary edema. No pleural effusion. No pneumothorax. HEART AND MEDIASTINUM: No acute abnormality of the cardiac and mediastinal silhouettes. BONES AND SOFT TISSUES: No acute osseous abnormality. IMPRESSION: 1. No acute process. Electronically signed by: Pinkie Pebbles MD 11/21/2023 04:02 AM EDT RP Workstation: [id number]   DG Chest Portable 1 View Result Date: 11/18/2023 CLINICAL DATA:  Dyspnea and shortness of breath.  EXAM: PORTABLE CHEST 1 VIEW COMPARISON:  Nov 11, 2023 FINDINGS: The heart size and mediastinal contours are within normal limits.  Low lung volumes are noted without evidence of an acute infiltrate, pleural effusion or pneumothorax. Multilevel degenerative changes are seen throughout the thoracic spine. IMPRESSION: Low lung volumes without evidence of acute or active cardiopulmonary disease. Electronically Signed   By: Suzen Dials M.D.   On: 11/18/2023 02:31   CT ABDOMEN PELVIS W CONTRAST Result Date: 11/16/2023 CLINICAL DATA:  Pain. EXAM: CT ANGIOGRAPHY CHEST CT ABDOMEN AND PELVIS WITH CONTRAST TECHNIQUE: Multidetector CT imaging of the chest was performed using the standard protocol during bolus administration of intravenous contrast. Multiplanar CT image reconstructions and MIPs were obtained to evaluate the vascular anatomy. Multidetector CT imaging of the abdomen and pelvis was performed using the standard protocol during bolus administration of intravenous contrast. RADIATION DOSE REDUCTION: This exam was performed according to the departmental dose-optimization program which includes automated exposure control, adjustment of the mA and/or kV according to patient size and/or use of iterative reconstruction technique. CONTRAST:  75mL OMNIPAQUE  IOHEXOL  350 MG/ML SOLN COMPARISON:  06/28/2023. FINDINGS: CTA CHEST FINDINGS Cardiovascular: Satisfactory opacification of the pulmonary arteries to the segmental level. No evidence of pulmonary embolism. Normal heart size. No pericardial effusion. Mediastinum/Nodes: Bilateral thyroid  nodules. This can be evaluated with ultrasound. No suspicious mediastinal, hilar or axillary adenopathy. Lungs/Pleura: Dependent bibasilar subsegmental atelectasis. No pleural or pericardial effusions. Lungs otherwise clear. Musculoskeletal: Sclerotic changes and compression deformities of T4, T6 and T9 are stable findings. Review of the MIP images confirms the above findings. CT  ABDOMEN and PELVIS FINDINGS Hepatobiliary: No focal liver abnormality is seen. No gallstones, gallbladder wall thickening, or biliary dilatation. Pancreas: Unremarkable. No pancreatic ductal dilatation or surrounding inflammatory changes. Spleen: Normal in size without focal abnormality. Adrenals/Urinary Tract: Adrenal glands are unremarkable. Kidneys are normal, without renal calculi, focal lesion, or hydronephrosis. Bladder is not well visualized due to artifact from a right hip prosthesis. Stomach/Bowel: Postop changes in the stomach. Small hiatal hernia. Appendix appears normal. No evidence of bowel wall thickening, distention, or inflammatory changes. Vascular/Lymphatic: Aortic atherosclerosis. No enlarged abdominal or pelvic lymph nodes. Reproductive: Status post hysterectomy. No adnexal masses. 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 Musculoskeletal: Right hip prosthesis. Thoracolumbar degenerative changes. Review of the MIP images confirms the above findings. IMPRESSION: 1. No evidence of PE. 2. Bilateral thyroid  nodules. This can be evaluated with ultrasound. 3. No acute abdominal or pelvic pathology identified. 4. Aortic atherosclerosis (ICD10-I70.0). Electronically Signed   By: Fonda Field M.D.   On: 11/16/2023 10:24   CT Angio Chest PE W/Cm &/Or Wo Cm Result Date: 11/16/2023 CLINICAL DATA:  Pain. EXAM: CT ANGIOGRAPHY CHEST CT ABDOMEN AND PELVIS WITH CONTRAST TECHNIQUE: Multidetector CT imaging of the chest was performed using the standard protocol during bolus administration of intravenous contrast. Multiplanar CT image reconstructions and MIPs were obtained to evaluate the vascular anatomy. Multidetector CT imaging of the abdomen and pelvis was performed using the standard protocol during bolus administration of intravenous contrast. RADIATION DOSE REDUCTION: This exam was performed according to the departmental  dose-optimization program which includes automated exposure control, adjustment of the mA and/or kV according to patient size and/or use of iterative reconstruction technique. CONTRAST:  75mL OMNIPAQUE  IOHEXOL  350 MG/ML SOLN COMPARISON:  06/28/2023. FINDINGS: CTA CHEST FINDINGS Cardiovascular: Satisfactory opacification of the pulmonary arteries to the segmental level. No evidence of pulmonary embolism. Normal heart size. No pericardial effusion. Mediastinum/Nodes: Bilateral thyroid  nodules. This can be evaluated with ultrasound. No suspicious mediastinal, hilar or axillary adenopathy.  Lungs/Pleura: Dependent bibasilar subsegmental atelectasis. No pleural or pericardial effusions. Lungs otherwise clear. Musculoskeletal: Sclerotic changes and compression deformities of T4, T6 and T9 are stable findings. Review of the MIP images confirms the above findings. CT ABDOMEN and PELVIS FINDINGS Hepatobiliary: No focal liver abnormality is seen. No gallstones, gallbladder wall thickening, or biliary dilatation. Pancreas: Unremarkable. No pancreatic ductal dilatation or surrounding inflammatory changes. Spleen: Normal in size without focal abnormality. Adrenals/Urinary Tract: Adrenal glands are unremarkable. Kidneys are normal, without renal calculi, focal lesion, or hydronephrosis. Bladder is not well visualized due to artifact from a right hip prosthesis. Stomach/Bowel: Postop changes in the stomach. Small hiatal hernia. Appendix appears normal. No evidence of bowel wall thickening, distention, or inflammatory changes. Vascular/Lymphatic: Aortic atherosclerosis. No enlarged abdominal or pelvic lymph nodes. Reproductive: Status post hysterectomy. No adnexal masses. 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 Musculoskeletal: Right hip prosthesis. Thoracolumbar degenerative changes. Review of the MIP images confirms the above findings. IMPRESSION: 1.  No evidence of PE. 2. Bilateral thyroid  nodules. This can be evaluated with ultrasound. 3. No acute abdominal or pelvic pathology identified. 4. Aortic atherosclerosis (ICD10-I70.0). Electronically Signed   By: Fonda Field M.D.   On: 11/16/2023 10:24   US  Abdomen Limited RUQ (LIVER/GB) Result Date: 11/16/2023 CLINICAL DATA:  151471 RUQ pain 151471 EXAM: ULTRASOUND ABDOMEN LIMITED COMPARISON:  None Available. FINDINGS: The liver demonstrates normal parenchymal echogenicity and homogeneous texture without focal hepatic parenchymal lesions or intrahepatic ductal dilatation. Hepatopetal portal vein flow. The gallbladder demonstrates no stones, wall thickening or pericholecystic fluid. CBD measured 0.5cm. IMPRESSION: Unremarkable examination of the right upper quadrant. Electronically Signed   By: Fonda Field M.D.   On: 11/16/2023 08:22    Micro Results    No results found for this or any previous visit (from the past 240 hours).  Today   Subjective    [name] today has no headache,no chest abdominal pain,no new weakness tingling or numbness, feels much better wants to go home today.    Objective   Blood pressure 120/72, pulse 80, temperature (!) 97.4 F (36.3 C), temperature source Oral, resp. rate 18, height 5' 2 (1.575 m), weight 58.7 kg, last menstrual period 01/08/2011, SpO2 95%.   Intake/Output Summary (Last 24 hours) at 12/13/2023 0807 Last data filed at 12/12/2023 2201 Gross per 24 hour  Intake 1485 ml  Output --  Net 1485 ml    Exam  Awake Alert, No new F.N deficits,    [place].AT,PERRAL Supple Neck,   Symmetrical Chest wall movement, Good air movement bilaterally, CTAB RRR,No Gallops,   +ve B.Sounds, Abd Soft, Non tender,  No Cyanosis, Clubbing or edema    Data Review   Recent Labs  Lab 12/09/23 0500 12/10/23 0405 12/11/23 0349 12/12/23 0427 12/13/23 0430  WBC 4.2 6.0 7.8 10.9* 6.8  HGB 9.2* 8.6* 8.4* 9.4* 8.5*  HCT 31.6* 29.5* 29.3* 31.1* 28.4*   PLT 201 263 368 534* 527*  MCV 75.6* 76.2* 77.1* 75.3* 76.1*  MCH 22.0* 22.2* 22.1* 22.8* 22.8*  MCHC 29.1* 29.2* 28.7* 30.2 29.9*  RDW 20.6* 21.2* 21.8* 22.7* 22.5*  LYMPHSABS 1.4 1.8 2.3 3.2 2.2  MONOABS 0.5 0.4 0.6 0.8 0.7  EOSABS 0.1 0.0 0.0 0.1 0.1  BASOSABS 0.0 0.0 0.0 0.1 0.0    Recent Labs  Lab 12/07/23 0337 12/08/23 0343 12/08/23 0942 12/09/23 0500 12/10/23 0405 12/11/23 0349 12/12/23 0427 12/13/23 0430  NA 136 138  --  138 136 139 136 135  K 3.1* 3.6  --  3.9 3.3* 4.3 3.8 3.7  CL 101 107  --  107 107 108 103 105  CO2 28 23  --  21* 22 24 24 24   ANIONGAP 7 8  --  10 7 7 9 6   GLUCOSE 154* 150*  --  209* 307* 150* 153* 180*  BUN 7 5*  --  <5* 8 8 8 9   CREATININE 0.47 0.36*  --  0.47 0.62 0.47 0.50 0.51  CRP  --   --  1.3* 0.8 0.7 0.6  --   --   PROCALCITON  --   --  0.12 <0.10 <0.10 <0.10  --   --   BNP  --   --  135.1* 33.4 33.8 55.2 95.6  --   MG 2.0 2.0  --  2.2 2.1 2.0 1.9  --   PHOS 4.7* 3.5  --   --   --   --   --   --   CALCIUM  8.2* 8.3*  --  8.3* 8.4* 8.3* 9.3 8.3*    Total Time in preparing paper work, data evaluation and todays exam - 35 minutes  Signature  -    Lavada Stank M.D on 12/13/2023 at 8:07 AM   -  To page go to [email or web address]

## 2023-12-13 NOTE — Plan of Care (Signed)
  Problem: Fluid Volume: Goal: Hemodynamic stability will improve Outcome: Progressing   Problem: Clinical Measurements: Goal: Diagnostic test results will improve Outcome: Progressing Goal: Signs and symptoms of infection will decrease Outcome: Progressing   Problem: Respiratory: Goal: Ability to maintain adequate ventilation will improve Outcome: Progressing   Problem: Education: Goal: Ability to describe self-care measures that may prevent or decrease complications (Diabetes Survival Skills Education) will improve Outcome: Progressing Goal: Individualized Educational Video(s) Outcome: Progressing   Problem: Coping: Goal: Ability to adjust to condition or change in health will improve Outcome: Progressing   Problem: Fluid Volume: Goal: Ability to maintain a balanced intake and output will improve Outcome: Progressing   Problem: Health Behavior/Discharge Planning: Goal: Ability to identify and utilize available resources and services will improve Outcome: Progressing Goal: Ability to manage health-related needs will improve Outcome: Progressing   Problem: Metabolic: Goal: Ability to maintain appropriate glucose levels will improve Outcome: Progressing   Problem: Nutritional: Goal: Maintenance of adequate nutrition will improve Outcome: Progressing Goal: Progress toward achieving an optimal weight will improve Outcome: Progressing   Problem: Skin Integrity: Goal: Risk for impaired skin integrity will decrease Outcome: Progressing   Problem: Tissue Perfusion: Goal: Adequacy of tissue perfusion will improve Outcome: Progressing   Problem: Education: Goal: Knowledge of General Education information will improve Description: Including pain rating scale, medication(s)/side effects and non-pharmacologic comfort measures Outcome: Progressing   Problem: Health Behavior/Discharge Planning: Goal: Ability to manage health-related needs will improve Outcome:  Progressing   Problem: Clinical Measurements: Goal: Ability to maintain clinical measurements within normal limits will improve Outcome: Progressing Goal: Will remain free from infection Outcome: Progressing Goal: Diagnostic test results will improve Outcome: Progressing Goal: Respiratory complications will improve Outcome: Progressing Goal: Cardiovascular complication will be avoided Outcome: Progressing   Problem: Activity: Goal: Risk for activity intolerance will decrease Outcome: Progressing   Problem: Nutrition: Goal: Adequate nutrition will be maintained Outcome: Progressing   Problem: Coping: Goal: Level of anxiety will decrease Outcome: Progressing   Problem: Elimination: Goal: Will not experience complications related to bowel motility Outcome: Progressing Goal: Will not experience complications related to urinary retention Outcome: Progressing   Problem: Pain Managment: Goal: General experience of comfort will improve and/or be controlled Outcome: Progressing   Problem: Safety: Goal: Ability to remain free from injury will improve Outcome: Progressing   Problem: Skin Integrity: Goal: Risk for impaired skin integrity will decrease Outcome: Progressing   Problem: Safety: Goal: Non-violent Restraint(s) Outcome: Progressing

## 2023-12-17 ENCOUNTER — Emergency Department (HOSPITAL_COMMUNITY)

## 2023-12-17 ENCOUNTER — Emergency Department (HOSPITAL_COMMUNITY)
Admission: EM | Admit: 2023-12-17 | Discharge: 2023-12-17 | Disposition: A | Discharge status: Home / Self Care | Attending: Emergency Medicine | Admitting: Emergency Medicine

## 2023-12-17 ENCOUNTER — Other Ambulatory Visit: Payer: Self-pay

## 2023-12-17 DIAGNOSIS — R739 Hyperglycemia, unspecified: Secondary | ICD-10-CM | POA: Diagnosis not present

## 2023-12-17 DIAGNOSIS — R2232 Localized swelling, mass and lump, left upper limb: Secondary | ICD-10-CM | POA: Diagnosis not present

## 2023-12-17 DIAGNOSIS — R109 Unspecified abdominal pain: Secondary | ICD-10-CM | POA: Diagnosis present

## 2023-12-17 DIAGNOSIS — J449 Chronic obstructive pulmonary disease, unspecified: Secondary | ICD-10-CM | POA: Insufficient documentation

## 2023-12-17 DIAGNOSIS — Z7951 Long term (current) use of inhaled steroids: Secondary | ICD-10-CM | POA: Diagnosis not present

## 2023-12-17 DIAGNOSIS — D75839 Thrombocytosis, unspecified: Secondary | ICD-10-CM | POA: Diagnosis not present

## 2023-12-17 DIAGNOSIS — M7989 Other specified soft tissue disorders: Secondary | ICD-10-CM | POA: Diagnosis not present

## 2023-12-17 DIAGNOSIS — Z8709 Personal history of other diseases of the respiratory system: Secondary | ICD-10-CM

## 2023-12-17 LAB — BASIC METABOLIC PANEL WITH GFR
Anion gap: 12 (ref 5–15)
BUN: <5 mg/dL — ABNORMAL LOW (ref 6–20)
CO2: 23 mmol/L (ref 22–32)
Calcium: 8.8 mg/dL — ABNORMAL LOW (ref 8.9–10.3)
Chloride: 102 mmol/L (ref 98–111)
Creatinine, Ser: 0.43 mg/dL — ABNORMAL LOW (ref 0.44–1.00)
GFR, Estimated: >60 mL/min (ref >60)
Glucose, Bld: 186 mg/dL — ABNORMAL HIGH (ref 70–99)
Potassium: 4.6 mmol/L (ref 3.5–5.1)
Sodium: 137 mmol/L (ref 135–145)

## 2023-12-17 LAB — URINALYSIS, ROUTINE W REFLEX MICROSCOPIC
Bilirubin Urine: NEGATIVE
Glucose, UA: >=500 mg/dL — AB
Hgb urine dipstick: NEGATIVE
Ketones, ur: NEGATIVE mg/dL
Leukocytes,Ua: NEGATIVE
Nitrite: NEGATIVE
Protein, ur: NEGATIVE mg/dL
Specific Gravity, Urine: 1.003 — ABNORMAL LOW (ref 1.005–1.030)
pH: 5 (ref 5.0–8.0)

## 2023-12-17 LAB — CBC
HCT: 31.7 % — ABNORMAL LOW (ref 36.0–46.0)
Hemoglobin: 9.1 g/dL — ABNORMAL LOW (ref 12.0–15.0)
MCH: 22.5 pg — ABNORMAL LOW (ref 26.0–34.0)
MCHC: 28.7 g/dL — ABNORMAL LOW (ref 30.0–36.0)
MCV: 78.5 fL — ABNORMAL LOW (ref 80.0–100.0)
Platelets: 752 10*3/uL — ABNORMAL HIGH (ref 150–400)
RBC: 4.04 MIL/uL (ref 3.87–5.11)
RDW: 22.1 % — ABNORMAL HIGH (ref 11.5–15.5)
WBC: 6.1 10*3/uL (ref 4.0–10.5)
nRBC: 0 % (ref 0.0–0.2)

## 2023-12-17 NOTE — ED Notes (Signed)
Patient walked to the restroom with no assistance.

## 2023-12-17 NOTE — Discharge Instructions (Addendum)
 It was our pleasure to provide your ER care today - we hope that you feel better. Stay well hydrated, drink plenty of fluids. Follow diabetes meal plan.   Overall, your imaging tests  look good. From the lab tests, your blood sugar is high, and platelet count is high - discuss with your doctor at follow up. Follow up closely with primary care doctor in the next 2-3 days for recheck.   Return to ER right away if worse, new symptoms, new or worsening or severe abdominal pain, fevers, vomiting, increased left hand or arm pain and swelling, chest pain, increased trouble breathing, or other concern.

## 2023-12-17 NOTE — ED Triage Notes (Signed)
 Pt arrives from home 1.5 hours ago, onset of lower back pain, painful urination, difficulty urinating. ETOH. Also c/o left hand swelling. Vitals 138/78, hr 86, rr 18, 94% 5 liters baseline. CBG 332

## 2023-12-17 NOTE — ED Triage Notes (Signed)
 Pt says she woke up with left sided back/flank pain that goes to the left lower abdomen. She also has swelling in the left hand, says she may have slept on it. Difficulty urinating when she woke up, but was able to urinate on arrival to the ED.

## 2023-12-17 NOTE — ED Provider Notes (Signed)
 [facility] AT [facility] Provider Note   CSN: [id number] Arrival date & time: 12/17/23  9468     Patient presents with: Flank Pain   [name] is a 58 y.o. female.   Pt with hx copd, c/o left flank pain acute onset early this AM. Pain left flank, occasionally radiates to left lower abdomen, waxing and waning in intensity. Pt unsure of hx same pain - pt poor historian. No hx kidney stones. No dysuria or hematuria. Is having normal bms. No abd distension, or vomiting. No fever or chills. No midline/spine pain. Also has recent noticed some swelling to dorsum left hand. No new or worsening arm pain. No numbness/weakness. Denies trauma, but says she thinks she may have slept on it funny. Is noted to have hx recent LUE dvt, is not on anticoagulation due to hx chronic etoh/cocaine use, hx falls, hx anemia, hx non compliance w meds.   The history is provided by the patient and medical records.  Flank Pain Pertinent negatives include no chest pain, no abdominal pain, no headaches and no shortness of breath.       Prior to Admission medications   Medication Sig Start Date End Date Taking? Authorizing Provider  albuterol  (VENTOLIN  HFA) 108 (90 Base) MCG/ACT inhaler Inhale 2 puffs into the lungs every 4 (four) hours as needed for wheezing or shortness of breath. 11/30/23   Hydeia Mcatee, MD  ARIPiprazole  (ABILIFY ) 10 MG tablet Take 1 tablet (10 mg total) by mouth daily. 11/01/23   Samtani, Jai-Gurmukh, MD  divalproex  (DEPAKOTE  SPRINKLE) 125 MG capsule Take 2 capsules (250 mg total) by mouth every 12 (twelve) hours. 11/14/23   Ghimire, Donalda HERO, MD  fluticasone  furoate-vilanterol (BREO ELLIPTA ) 100-25 MCG/ACT AEPB Inhale 1 puff into the lungs daily. 11/14/23   Ghimire, Donalda HERO, MD  folic acid  (FOLVITE ) 1 MG tablet Take 1 tablet (1 mg total) by mouth daily. 12/13/23   Singh, Prashant K, MD  ipratropium-albuterol  (DUONEB) 0.5-2.5 (3) MG/3ML SOLN Take 3 mLs by  nebulization every 6 (six) hours as needed (for shortness of breath or wheezing).    [provider]  levETIRAcetam  (KEPPRA  XR) 500 MG 24 hr tablet Take 1 tablet (500 mg total) by mouth 2 (two) times daily. 10/31/23   Samtani, Jai-Gurmukh, MD  metFORMIN  (GLUCOPHAGE -XR) 750 MG 24 hr tablet Take 1 tablet (750 mg total) by mouth daily with breakfast. 12/13/23   Singh, Prashant K, MD  metoprolol  tartrate (LOPRESSOR ) 25 MG tablet Take 1 tablet (25 mg total) by mouth 2 (two) times daily. 12/13/23   Singh, Prashant K, MD  neomycin -bacitracin -polymyxin [phone] OINT Apply 1 Application topically 3 (three) times daily. Apply to your right nipple 3 times a day for 2 weeks 12/13/23   Singh, Prashant K, MD  nicotine  (NICODERM CQ  - DOSED IN MG/24 HOURS) 21 mg/24hr patch Place 1 patch (21 mg total) onto the skin daily. 12/13/23   Singh, Prashant K, MD  OXYGEN  Inhale 4-5 L/min into the lungs daily as needed (for COPD-related symptoms).    [provider]  pantoprazole  (PROTONIX ) 40 MG tablet Take 1 tablet (40 mg total) by mouth daily. 12/13/23   Singh, Prashant K, MD  thiamine  (VITAMIN B-1) 100 MG tablet Take 1 tablet (100 mg total) by mouth daily. 11/14/23   Ghimire, Donalda HERO, MD  umeclidinium bromide  (INCRUSE ELLIPTA ) 62.5 MCG/ACT AEPB Inhale 1 puff into the lungs daily. 11/14/23   Ghimire, Donalda HERO, MD  zonisamide  (ZONEGRAN ) 100 MG  capsule Take 2 capsules (200 mg total) by mouth daily. 10/31/23   Samtani, Jai-Gurmukh, MD    Allergies: Iron  dextran, Aspirin , and Penicillins    Review of Systems  Constitutional:  Negative for chills and fever.  Respiratory:  Negative for cough and shortness of breath.   Cardiovascular:  Negative for chest pain.  Gastrointestinal:  Negative for abdominal pain, blood in stool, constipation and vomiting.  Genitourinary:  Positive for flank pain. Negative for dysuria, frequency, vaginal bleeding and vaginal discharge.  Musculoskeletal:  Negative for back pain and  neck pain.  Skin:  Negative for rash.  Neurological:  Negative for weakness, numbness and headaches.    Updated Vital Signs BP 135/77 (BP Location: Left Arm)   Pulse 85   Temp 97.8 F (36.6 C) (Oral)   Resp 16   Ht 1.575 m (5' 2)   Wt 59 kg   LMP 01/08/2011   SpO2 98%   BMI 23.78 kg/m   Physical Exam Vitals and nursing note reviewed.  Constitutional:      Appearance: Normal appearance. She is well-developed.  HENT:     Head: Atraumatic.     Nose: Nose normal.     Mouth/Throat:     Mouth: Mucous membranes are moist.   Eyes:     General: No scleral icterus.    Conjunctiva/sclera: Conjunctivae normal.   Neck:     Trachea: No tracheal deviation.   Cardiovascular:     Rate and Rhythm: Normal rate and regular rhythm.     Pulses: Normal pulses.     Heart sounds: Normal heart sounds. No murmur heard.    No friction rub. No gallop.  Pulmonary:     Effort: Pulmonary effort is normal. No respiratory distress.     Breath sounds: Normal breath sounds.  Abdominal:     General: Bowel sounds are normal. There is no distension.     Palpations: Abdomen is soft.     Tenderness: There is no abdominal tenderness. There is no guarding.  Genitourinary:    Comments: No cva tenderness.   Musculoskeletal:     Cervical back: Normal range of motion and neck supple. No rigidity. No muscular tenderness.     Comments: Mild swelling to dorsum left hand. No focal bony tenderness. Radial pulse 2+, normal cap refill in digits. No other focal swelling, pain or tenderness to LUE. LUE and hand is of normal color and warmth.    Skin:    General: Skin is warm and dry.     Findings: No rash.   Neurological:     Mental Status: She is alert.     Comments: Alert, speech normal. Motor/sens grossly intact bil. LUE/hand is nvi.   Psychiatric:        Mood and Affect: Mood normal.     (all labs ordered are listed, but only abnormal results are displayed) Results for orders placed or performed  during the hospital encounter of 12/17/23  Urinalysis, Routine w reflex microscopic -Urine, Clean Catch   Collection Time: 12/17/23  7:07 AM  Result Value Ref Range   Color, Urine STRAW (A) YELLOW   APPearance CLEAR CLEAR   Specific Gravity, Urine 1.003 (L) 1.005 - 1.030   pH 5.0 5.0 - 8.0   Glucose, UA >=500 (A) NEGATIVE mg/dL   Hgb urine dipstick NEGATIVE NEGATIVE   Bilirubin Urine NEGATIVE NEGATIVE   Ketones, ur NEGATIVE NEGATIVE mg/dL   Protein, ur NEGATIVE NEGATIVE mg/dL   Nitrite NEGATIVE NEGATIVE  Leukocytes,Ua NEGATIVE NEGATIVE   RBC / HPF 0-5 0 - 5 RBC/hpf   WBC, UA 0-5 0 - 5 WBC/hpf   Bacteria, UA RARE (A) NONE SEEN   Squamous Epithelial / HPF 0-5 0 - 5 /HPF   Mucus PRESENT   Basic metabolic panel   Collection Time: 12/17/23  7:07 AM  Result Value Ref Range   Sodium 137 135 - 145 mmol/L   Potassium 4.6 3.5 - 5.1 mmol/L   Chloride 102 98 - 111 mmol/L   CO2 23 22 - 32 mmol/L   Glucose, Bld 186 (H) 70 - 99 mg/dL   BUN <5 (L) 6 - 20 mg/dL   Creatinine, Ser 9.56 (L) 0.44 - 1.00 mg/dL   Calcium  8.8 (L) 8.9 - 10.3 mg/dL   GFR, Estimated >39 >39 mL/min   Anion gap 12 5 - 15  CBC   Collection Time: 12/17/23  7:07 AM  Result Value Ref Range   WBC 6.1 4.0 - 10.5 K/uL   RBC 4.04 3.87 - 5.11 MIL/uL   Hemoglobin 9.1 (L) 12.0 - 15.0 g/dL   HCT 68.2 (L) 63.9 - 53.9 %   MCV 78.5 (L) 80.0 - 100.0 fL   MCH 22.5 (L) 26.0 - 34.0 pg   MCHC 28.7 (L) 30.0 - 36.0 g/dL   RDW 77.8 (H) 88.4 - 84.4 %   Platelets 752 (H) 150 - 400 K/uL   nRBC 0.0 0.0 - 0.2 %   CT ABDOMEN PELVIS WO CONTRAST Result Date: 12/17/2023 CLINICAL DATA:  Acute left flank and abdominal pain beginning last night. Dysuria. EXAM: CT ABDOMEN AND PELVIS WITHOUT CONTRAST TECHNIQUE: Multidetector CT imaging of the abdomen and pelvis was performed following the standard protocol without IV contrast. RADIATION DOSE REDUCTION: This exam was performed according to the departmental dose-optimization program which includes  automated exposure control, adjustment of the mA and/or kV according to patient size and/or use of iterative reconstruction technique. COMPARISON:  11/16/2023 FINDINGS: Lower chest: No acute findings. Hepatobiliary: No mass visualized on this unenhanced exam. Gallbladder is unremarkable. No evidence of biliary ductal dilatation. Pancreas: No mass or inflammatory process visualized on this unenhanced exam. Spleen:  Within normal limits in size. Adrenals/Urinary tract: No evidence of urolithiasis or hydronephrosis. Unremarkable unopacified urinary bladder. Stomach/Bowel: Previous gastric bypass surgery again noted. Small hiatal hernia again seen. No evidence of obstruction, inflammatory process, or abnormal fluid collections. Vascular/Lymphatic: No pathologically enlarged lymph nodes identified. No evidence of abdominal aortic aneurysm. Reproductive: Prior hysterectomy noted. Beam hardening artifact noted from right hip prosthesis. No pelvic masses or abnormal fluid collections identified. Other:  None. Musculoskeletal:  No suspicious bone lesions identified. IMPRESSION: No evidence of urolithiasis, hydronephrosis, or other acute findings. Previous gastric bypass surgery, with small hiatal hernia noted. Electronically Signed   By: Norleen DELENA Kil M.D.   On: 12/17/2023 08:13      EKG: None  Radiology: UE VENOUS DUPLEX (7am - 7pm) Result Date: 12/17/2023 UPPER VENOUS STUDY  Patient Name:  [name]  Date of Exam:   12/17/2023 Medical Rec #: [id number]       Accession #:    [id number] Date of Birth: 02-13-1966      Patient Gender: F Patient Age:   74 years Exam Location:  [facility] Procedure:      VAS US  UPPER EXTREMITY VENOUS DUPLEX Referring Phys: Muna Demers --------------------------------------------------------------------------------  Indications: swelling of left hand Risk Factors: DVT LUE (10/2023). Comparison       Previous exam on  11/04/2023 was positive or DVT in left ulnar Study:            veins. Performing Technologist: Ezzie Potters RVT, RDMS  Examination Guidelines: A complete evaluation includes B-mode imaging, spectral Doppler, color Doppler, and power Doppler as needed of all accessible portions of each vessel. Bilateral testing is considered an integral part of a complete examination. Limited examinations for reoccurring indications may be performed as noted.  Right Findings: +----------+------------+---------+-----------+----------+-------+ RIGHT     CompressiblePhasicitySpontaneousPropertiesSummary +----------+------------+---------+-----------+----------+-------+ Subclavian    Full       Yes       Yes                      +----------+------------+---------+-----------+----------+-------+  Left Findings: +----------+------------+---------+-----------+----------+--------------+ LEFT      CompressiblePhasicitySpontaneousProperties   Summary     +----------+------------+---------+-----------+----------+--------------+ IJV           Full       Yes       Yes                             +----------+------------+---------+-----------+----------+--------------+ Subclavian    Full       Yes       Yes                             +----------+------------+---------+-----------+----------+--------------+ Axillary      Full       Yes       Yes                             +----------+------------+---------+-----------+----------+--------------+ Brachial      Full       Yes       Yes                             +----------+------------+---------+-----------+----------+--------------+ Radial        Full                                                 +----------+------------+---------+-----------+----------+--------------+ Ulnar         Full                                                 +----------+------------+---------+-----------+----------+--------------+ Cephalic                                            Not visualized  +----------+------------+---------+-----------+----------+--------------+ Basilic       Full       Yes       Yes                             +----------+------------+---------+-----------+----------+--------------+  Summary:  Right: No evidence of thrombosis in the subclavian.  Left: No evidence of deep vein thrombosis in the upper extremity. No evidence of superficial vein thrombosis in the upper extremity. However, unable to visualize  the cephalic vein.  *See table(s) above for measurements and observations.    Preliminary    DG Hand Complete Left Result Date: 12/17/2023 CLINICAL DATA:  Left hand swelling.  No known injury. EXAM: LEFT HAND - COMPLETE 3+ VIEW COMPARISON:  None Available. FINDINGS: Mild degenerative changes involving the 1st IP joint. Otherwise, unremarkable bones and soft tissues. IMPRESSION: Mild 1st IP joint degenerative changes. Electronically Signed   By: Elspeth Bathe M.D.   On: 12/17/2023 11:07   CT ABDOMEN PELVIS WO CONTRAST Result Date: 12/17/2023 CLINICAL DATA:  Acute left flank and abdominal pain beginning last night. Dysuria. EXAM: CT ABDOMEN AND PELVIS WITHOUT CONTRAST TECHNIQUE: Multidetector CT imaging of the abdomen and pelvis was performed following the standard protocol without IV contrast. RADIATION DOSE REDUCTION: This exam was performed according to the departmental dose-optimization program which includes automated exposure control, adjustment of the mA and/or kV according to patient size and/or use of iterative reconstruction technique. COMPARISON:  11/16/2023 FINDINGS: Lower chest: No acute findings. Hepatobiliary: No mass visualized on this unenhanced exam. Gallbladder is unremarkable. No evidence of biliary ductal dilatation. Pancreas: No mass or inflammatory process visualized on this unenhanced exam. Spleen:  Within normal limits in size. Adrenals/Urinary tract: No evidence of urolithiasis or hydronephrosis. Unremarkable unopacified urinary bladder.  Stomach/Bowel: Previous gastric bypass surgery again noted. Small hiatal hernia again seen. No evidence of obstruction, inflammatory process, or abnormal fluid collections. Vascular/Lymphatic: No pathologically enlarged lymph nodes identified. No evidence of abdominal aortic aneurysm. Reproductive: Prior hysterectomy noted. Beam hardening artifact noted from right hip prosthesis. No pelvic masses or abnormal fluid collections identified. Other:  None. Musculoskeletal:  No suspicious bone lesions identified. IMPRESSION: No evidence of urolithiasis, hydronephrosis, or other acute findings. Previous gastric bypass surgery, with small hiatal hernia noted. Electronically Signed   By: Norleen DELENA Kil M.D.   On: 12/17/2023 08:13     Procedures   Medications Ordered in the ED - No data to display                                  Medical Decision Making Problems Addressed: History of COPD: chronic illness or injury with exacerbation, progression, or side effects of treatment that poses a threat to life or bodily functions Hyperglycemia: acute illness or injury Left flank pain: acute illness or injury Localized swelling on left hand: acute illness or injury Thrombocytosis: chronic illness or injury  Amount and/or Complexity of Data Reviewed External Data Reviewed: notes. Labs: ordered. Decision-making details documented in ED Course. Radiology: ordered and independent interpretation performed. Decision-making details documented in ED Course.  Risk Decision regarding hospitalization.   Iv ns. Continuous pulse ox and cardiac monitoring. Labs ordered/sent. Imaging ordered.   Differential diagnosis includes ureteral stone, pyelo, acute abd process, etc . Dispo decision including potential need for admission considered - will get labs and imaging and reassess.   Reviewed nursing notes and prior charts for additional history. External reports reviewed.   Cardiac monitor: sinus rhythm, rate 84.  Labs  reviewed/interpreted by me - wbc normal. Hgb c/w baseline. Chem unremarkable x glucose mildly high. UA neg for uti.   CT reviewed/interpreted by me - no acute process.   Vascular u/s - tech indicates no dvt.   Xrays reviewed/interpreted by me - no fx.   Pt asking for something to eat/drink - provided.  Recheck abd soft non tender. Pt breathing comfortably. Denies chest pain or sob.  Rn indicates pt is requesting d/c to home. Pt indicates she feels fine, no abd pain, no sob - she is requesting d/c home. Pt currently appears stable for ED d/c per her request.   Rec close pcp f/u.  Return precautions provided.       Final diagnoses:  Left flank pain  Localized swelling on left hand  History of COPD  Hyperglycemia  Thrombocytosis    ED Discharge Orders     None          Bernard Drivers, MD 12/17/23 1128

## 2023-12-17 NOTE — Progress Notes (Signed)
 LUE venous duplex has been completed.  Preliminary results given to Dr. Bernard.   Results can be found under chart review under CV PROC. 12/17/2023 11:17 AM Melanye Hiraldo RVT, RDMS

## 2023-12-18 ENCOUNTER — Emergency Department (HOSPITAL_COMMUNITY)
Admission: EM | Admit: 2023-12-18 | Discharge: 2023-12-18 | Disposition: A | Discharge status: Home / Self Care | Attending: Emergency Medicine | Admitting: Emergency Medicine

## 2023-12-18 ENCOUNTER — Emergency Department (HOSPITAL_COMMUNITY)

## 2023-12-18 DIAGNOSIS — J449 Chronic obstructive pulmonary disease, unspecified: Secondary | ICD-10-CM | POA: Diagnosis not present

## 2023-12-18 DIAGNOSIS — J90 Pleural effusion, not elsewhere classified: Secondary | ICD-10-CM | POA: Diagnosis not present

## 2023-12-18 DIAGNOSIS — R062 Wheezing: Secondary | ICD-10-CM

## 2023-12-18 DIAGNOSIS — R0602 Shortness of breath: Secondary | ICD-10-CM | POA: Diagnosis present

## 2023-12-18 MED ORDER — IPRATROPIUM-ALBUTEROL 0.5-2.5 (3) MG/3ML IN SOLN
3.0000 mL | Freq: Once | RESPIRATORY_TRACT | Status: AC
  Administered 2023-12-18: 3 mL via RESPIRATORY_TRACT
  Filled 2023-12-18: qty 3

## 2023-12-18 NOTE — Discharge Instructions (Signed)
 Please return for new or worsening symptoms.  Please follow-up with your doctor.  Please use the inhaler, 2 puffs every 4 hours for the next 2 days.

## 2023-12-18 NOTE — ED Provider Notes (Signed)
 [facility] Emergency Department Provider Note MRN:  [id number]  Arrival date & time: 12/18/23     Chief Complaint   Shortness of Breath   History of Present Illness   [name] is a 58 y.o. year-old female presents to the ED with chief complaint of shortness of breath.  She states that she woke up and felt like she could not breathe.  She called EMS and was told that she was wheezing.  She was given a breathing treatment.  She states that she now feels well.  She was recently admitted for pneumonia.  She denies new fevers or chills.  She states that she has been using an inhaler..  History provided by patient.   Review of Systems  Pertinent positive and negative review of systems noted in HPI.    Physical Exam   Vitals:   12/18/23 0324  BP: 127/81  Pulse: (!) 104  Resp: (!) 24  Temp: 98.2 F (36.8 C)  SpO2: 97%    CONSTITUTIONAL:  non toxic-appearing, NAD NEURO:  Alert and oriented x 3, CN 3-12 grossly intact EYES:  eyes equal and reactive ENT/NECK:  Supple, no stridor  CARDIO:  mildy tachy, regular rhythm, appears well-perfused  PULM:  No respiratory distress, CTAB now GI/GU:  non-distended,  MSK/SPINE:  No gross deformities, no edema, moves all extremities  SKIN:  no rash, atraumatic   *Additional and/or pertinent findings included in MDM below  Diagnostic and Interventional Summary    EKG Interpretation Date/Time:    Ventricular Rate:    PR Interval:    QRS Duration:    QT Interval:    QTC Calculation:   R Axis:      Text Interpretation:         Labs Reviewed - No data to display  DG Chest Port 1 View  Final Result      Medications  ipratropium-albuterol  (DUONEB) 0.5-2.5 (3) MG/3ML nebulizer solution 3 mL (3 mLs Nebulization Given 12/18/23 0419)     Procedures  /  Critical Care Procedures  ED Course and Medical Decision Making  I have reviewed the triage vital signs, the nursing notes, and pertinent available  records from the EMR.  Social Determinants Affecting Complexity of Care: Patient has no clinically significant social determinants affecting this chief complaint..   ED Course:    Medical Decision Making Patient came in with episode of shortness of breath.  She states that she woke up and felt like she could not breathe.  EMS was called and noted that she had some wheezing.  She was given a breathing treatment by EMS and then felt better.  On my exam, patient still continues to feel well.  I do not hear any wheezing on exam.  She is given 1 additional breathing treatment for good measure, and states that she feels well and is ready to go home.  I called and talked with her daughter, who is her guardian and power of attorney.  Daughter states that she will send an Uber at around 630.  Patient request to wait in the waiting room, I presume so that she can go outside and smoke.  She appears stable for discharge.  Amount and/or Complexity of Data Reviewed Radiology: ordered. ECG/medicine tests: ordered.  Risk Prescription drug management.         Consultants: No consultations were needed in caring for this patient.   Treatment and Plan: I considered admission due to patient's initial presentation, but after  considering the examination and diagnostic results, patient will not require admission and can be discharged with outpatient follow-up.    Final Clinical Impressions(s) / ED Diagnoses     ICD-10-CM   1. Wheezing  R06.2     2. Pleural effusion  J90       ED Discharge Orders     None         Discharge Instructions Discussed with and Provided to Patient:     Discharge Instructions      Please return for new or worsening symptoms.  Please follow-up with your doctor.  Please use the inhaler, 2 puffs every 4 hours for the next 2 days.       Vicky Charleston, PA-C 12/18/23 0601    Carita Senior, MD 12/18/23 [phone]

## 2023-12-18 NOTE — ED Triage Notes (Signed)
 Patient arrived with PT[facility] with complaints of shortness of breath, wheezing on EMS arrival given two breathing treatments. Hx of COPD

## 2023-12-25 ENCOUNTER — Encounter (HOSPITAL_COMMUNITY): Payer: Self-pay | Admitting: Emergency Medicine

## 2023-12-25 ENCOUNTER — Emergency Department (HOSPITAL_COMMUNITY)
Admission: EM | Admit: 2023-12-25 | Discharge: 2023-12-25 | Disposition: A | Discharge status: Home / Self Care | Attending: Emergency Medicine | Admitting: Emergency Medicine

## 2023-12-25 ENCOUNTER — Other Ambulatory Visit: Payer: Self-pay

## 2023-12-25 DIAGNOSIS — Z7951 Long term (current) use of inhaled steroids: Secondary | ICD-10-CM | POA: Diagnosis not present

## 2023-12-25 DIAGNOSIS — F17200 Nicotine dependence, unspecified, uncomplicated: Secondary | ICD-10-CM | POA: Diagnosis not present

## 2023-12-25 DIAGNOSIS — R000 Tachycardia, unspecified: Secondary | ICD-10-CM | POA: Insufficient documentation

## 2023-12-25 DIAGNOSIS — J441 Chronic obstructive pulmonary disease with (acute) exacerbation: Secondary | ICD-10-CM | POA: Diagnosis not present

## 2023-12-25 DIAGNOSIS — R0602 Shortness of breath: Secondary | ICD-10-CM | POA: Diagnosis present

## 2023-12-25 MED ORDER — ALBUTEROL SULFATE HFA 108 (90 BASE) MCG/ACT IN AERS
1.0000 | INHALATION_SPRAY | Freq: Once | RESPIRATORY_TRACT | Status: AC
  Administered 2023-12-25: 1 via RESPIRATORY_TRACT
  Filled 2023-12-25: qty 6.7

## 2023-12-25 NOTE — Discharge Instructions (Signed)
 Your workup this morning was consistent with a COPD exacerbation.  You may use the provided inhaler, up to 2 puffs every 4 hours as needed for wheezing or shortness of breath.  Return to the emergency department if you develop any life-threatening symptoms.

## 2023-12-25 NOTE — ED Notes (Signed)
 Pt is alert and oriented . Pt answering all questions appropriately

## 2023-12-25 NOTE — ED Triage Notes (Signed)
 Pt having sob that woke her up out of her sleep. Pt given duoneb from ems

## 2023-12-25 NOTE — ED Provider Notes (Signed)
 [place] EMERGENCY DEPARTMENT AT [facility] Provider Note   CSN: [id number] Arrival date & time: 12/25/23  9674     Patient presents with: Shortness of Breath   [name] is a 58 y.o. female.  Patient with past medical history significant for COPD presents to the emergency room via EMS complaining of shortness of breath.  She states that she felt short of breath and woke up from her sleep due to the shortness of breath.  EMS administered a DuoNeb.  At the time of my assessment patient was feeling much better and states she was breathing normally.  She was mildly tachycardic upon arrival.  She denied other complaints.    Shortness of Breath      Prior to Admission medications   Medication Sig Start Date End Date Taking? Authorizing Provider  albuterol  (VENTOLIN  HFA) 108 (90 Base) MCG/ACT inhaler Inhale 2 puffs into the lungs every 4 (four) hours as needed for wheezing or shortness of breath. 11/30/23   Steinl, Kevin, MD  ARIPiprazole  (ABILIFY ) 10 MG tablet Take 1 tablet (10 mg total) by mouth daily. 11/01/23   Samtani, Jai-Gurmukh, MD  divalproex  (DEPAKOTE  SPRINKLE) 125 MG capsule Take 2 capsules (250 mg total) by mouth every 12 (twelve) hours. 11/14/23   Ghimire, Donalda HERO, MD  fluticasone  furoate-vilanterol (BREO ELLIPTA ) 100-25 MCG/ACT AEPB Inhale 1 puff into the lungs daily. 11/14/23   Ghimire, Donalda HERO, MD  folic acid  (FOLVITE ) 1 MG tablet Take 1 tablet (1 mg total) by mouth daily. 12/13/23   Singh, Prashant K, MD  ipratropium-albuterol  (DUONEB) 0.5-2.5 (3) MG/3ML SOLN Take 3 mLs by nebulization every 6 (six) hours as needed (for shortness of breath or wheezing).    [provider]  levETIRAcetam  (KEPPRA  XR) 500 MG 24 hr tablet Take 1 tablet (500 mg total) by mouth 2 (two) times daily. 10/31/23   Samtani, Jai-Gurmukh, MD  metFORMIN  (GLUCOPHAGE -XR) 750 MG 24 hr tablet Take 1 tablet (750 mg total) by mouth daily with breakfast. 12/13/23   Singh, Prashant K, MD   metoprolol  tartrate (LOPRESSOR ) 25 MG tablet Take 1 tablet (25 mg total) by mouth 2 (two) times daily. 12/13/23   Singh, Prashant K, MD  neomycin -bacitracin -polymyxin [phone] OINT Apply 1 Application topically 3 (three) times daily. Apply to your right nipple 3 times a day for 2 weeks 12/13/23   Singh, Prashant K, MD  nicotine  (NICODERM CQ  - DOSED IN MG/24 HOURS) 21 mg/24hr patch Place 1 patch (21 mg total) onto the skin daily. 12/13/23   Singh, Prashant K, MD  OXYGEN  Inhale 4-5 L/min into the lungs daily as needed (for COPD-related symptoms).    [provider]  pantoprazole  (PROTONIX ) 40 MG tablet Take 1 tablet (40 mg total) by mouth daily. 12/13/23   Singh, Prashant K, MD  thiamine  (VITAMIN B-1) 100 MG tablet Take 1 tablet (100 mg total) by mouth daily. 11/14/23   Ghimire, Donalda HERO, MD  umeclidinium bromide  (INCRUSE ELLIPTA ) 62.5 MCG/ACT AEPB Inhale 1 puff into the lungs daily. 11/14/23   Ghimire, Donalda HERO, MD  zonisamide  (ZONEGRAN ) 100 MG capsule Take 2 capsules (200 mg total) by mouth daily. 10/31/23   Samtani, Jai-Gurmukh, MD    Allergies: Iron  dextran, Aspirin , and Penicillins    Review of Systems  Respiratory:  Positive for shortness of breath.     Updated Vital Signs BP 139/73 (BP Location: Left Arm)   Pulse (!) 114   Temp 97.6 F (36.4 C) (Oral)   Resp 18  LMP 01/08/2011   SpO2 100%   Physical Exam Vitals and nursing note reviewed.  Constitutional:      General: She is not in acute distress.    Appearance: She is well-developed.  HENT:     Head: Normocephalic and atraumatic.  Eyes:     Conjunctiva/sclera: Conjunctivae normal.  Cardiovascular:     Rate and Rhythm: Regular rhythm. Tachycardia present.     Comments: Mild tachycardia Pulmonary:     Effort: Pulmonary effort is normal. No respiratory distress.     Breath sounds: Normal breath sounds.     Comments: Lungs clear to auscultation bilaterally Abdominal:     Palpations: Abdomen is soft.      Tenderness: There is no abdominal tenderness.  Musculoskeletal:        General: No swelling.     Cervical back: Neck supple.  Skin:    General: Skin is warm and dry.     Capillary Refill: Capillary refill takes less than 2 seconds.  Neurological:     Mental Status: She is alert.  Psychiatric:        Mood and Affect: Mood normal.     (all labs ordered are listed, but only abnormal results are displayed) Labs Reviewed - No data to display  EKG: None  Radiology: No results found.   Procedures   Medications Ordered in the ED  albuterol  (VENTOLIN  HFA) 108 (90 Base) MCG/ACT inhaler 1 puff (1 puff Inhalation Given 12/25/23 0454)                                    Medical Decision Making Risk Prescription drug management.   This patient presents to the ED for concern of shortness of breath, this involves an extensive number of treatment options, and is a complaint that carries with it a high risk of complications and morbidity.  The differential diagnosis includes COPD exacerbation, pneumonia, viral infection, ACS, others   Co morbidities / Chronic conditions that complicate the patient evaluation  COPD   Additional history obtained:  Additional history obtained from EMR and EMS  Social Determinants of Health:  Patient is a daily smoker   Test / Admission - Considered:  Patient with lungs clear to auscultation at the time of my assessment.  No further medication required passed out which was given by EMS.  She states she is feeling much better.  She ambulated to the restroom with no difficulty with no increased shortness of breath.  Her oxygen  saturations have been maintaining above 95% on room air.  At this time I see no indication for further emergent workup or admission.  I will prescribe a inhaler for the patient as she states she is out of her home rescue inhaler.  Patient to follow-up with primary care for further evaluation as needed.  Return precautions  provided.      Final diagnoses:  COPD exacerbation [facility])    ED Discharge Orders     None          Logan Ubaldo KATHEE DEVONNA 12/25/23 0457    Theadore Ozell HERO, MD 12/25/23 [phone]

## 2024-01-01 ENCOUNTER — Emergency Department (HOSPITAL_COMMUNITY)

## 2024-01-01 ENCOUNTER — Emergency Department (HOSPITAL_COMMUNITY): Admission: EM | Admit: 2024-01-01 | Discharge: 2024-01-01 | Disposition: A | Discharge status: Home / Self Care

## 2024-01-01 ENCOUNTER — Other Ambulatory Visit: Payer: Self-pay

## 2024-01-01 ENCOUNTER — Emergency Department (HOSPITAL_COMMUNITY)
Admission: EM | Admit: 2024-01-01 | Discharge: 2024-01-01 | Disposition: A | Discharge status: Home / Self Care | Source: Home / Self Care | Attending: Emergency Medicine | Admitting: Emergency Medicine

## 2024-01-01 ENCOUNTER — Encounter (HOSPITAL_COMMUNITY): Payer: Self-pay

## 2024-01-01 DIAGNOSIS — F17210 Nicotine dependence, cigarettes, uncomplicated: Secondary | ICD-10-CM | POA: Diagnosis not present

## 2024-01-01 DIAGNOSIS — E119 Type 2 diabetes mellitus without complications: Secondary | ICD-10-CM | POA: Insufficient documentation

## 2024-01-01 DIAGNOSIS — Z7984 Long term (current) use of oral hypoglycemic drugs: Secondary | ICD-10-CM | POA: Insufficient documentation

## 2024-01-01 DIAGNOSIS — Z7951 Long term (current) use of inhaled steroids: Secondary | ICD-10-CM | POA: Insufficient documentation

## 2024-01-01 DIAGNOSIS — J449 Chronic obstructive pulmonary disease, unspecified: Secondary | ICD-10-CM | POA: Insufficient documentation

## 2024-01-01 DIAGNOSIS — E1165 Type 2 diabetes mellitus with hyperglycemia: Secondary | ICD-10-CM | POA: Insufficient documentation

## 2024-01-01 DIAGNOSIS — J441 Chronic obstructive pulmonary disease with (acute) exacerbation: Secondary | ICD-10-CM | POA: Diagnosis not present

## 2024-01-01 DIAGNOSIS — R739 Hyperglycemia, unspecified: Secondary | ICD-10-CM | POA: Diagnosis present

## 2024-01-01 DIAGNOSIS — R0602 Shortness of breath: Secondary | ICD-10-CM | POA: Diagnosis present

## 2024-01-01 LAB — URINALYSIS, ROUTINE W REFLEX MICROSCOPIC
Bacteria, UA: NONE SEEN
Bilirubin Urine: NEGATIVE
Glucose, UA: >=500 mg/dL — AB
Hgb urine dipstick: NEGATIVE
Ketones, ur: NEGATIVE mg/dL
Leukocytes,Ua: NEGATIVE
Nitrite: NEGATIVE
Protein, ur: NEGATIVE mg/dL
Specific Gravity, Urine: 1.017 (ref 1.005–1.030)
pH: 5 (ref 5.0–8.0)

## 2024-01-01 LAB — CBC
HCT: 33.7 % — ABNORMAL LOW (ref 36.0–46.0)
Hemoglobin: 10 g/dL — ABNORMAL LOW (ref 12.0–15.0)
MCH: 22.2 pg — ABNORMAL LOW (ref 26.0–34.0)
MCHC: 29.7 g/dL — ABNORMAL LOW (ref 30.0–36.0)
MCV: 74.7 fL — ABNORMAL LOW (ref 80.0–100.0)
Platelets: 175 K/uL (ref 150–400)
RBC: 4.51 MIL/uL (ref 3.87–5.11)
RDW: 20.5 % — ABNORMAL HIGH (ref 11.5–15.5)
WBC: 6.4 K/uL (ref 4.0–10.5)
nRBC: 0 % (ref 0.0–0.2)

## 2024-01-01 LAB — I-STAT CHEM 8, ED
BUN: 6 mg/dL (ref 6–20)
Calcium, Ion: 1.16 mmol/L (ref 1.15–1.40)
Chloride: 106 mmol/L (ref 98–111)
Creatinine, Ser: 0.7 mg/dL (ref 0.44–1.00)
Glucose, Bld: 251 mg/dL — ABNORMAL HIGH (ref 70–99)
HCT: 37 % (ref 36.0–46.0)
Hemoglobin: 12.6 g/dL (ref 12.0–15.0)
Potassium: 4.5 mmol/L (ref 3.5–5.1)
Sodium: 138 mmol/L (ref 135–145)
TCO2: 19 mmol/L — ABNORMAL LOW (ref 22–32)

## 2024-01-01 LAB — CBC WITH DIFFERENTIAL/PLATELET
Abs Immature Granulocytes: 0 K/uL (ref 0.00–0.07)
Basophils Absolute: 0 K/uL (ref 0.0–0.1)
Basophils Relative: 0 %
Eosinophils Absolute: 0 K/uL (ref 0.0–0.5)
Eosinophils Relative: 0 %
HCT: 36.1 % (ref 36.0–46.0)
Hemoglobin: 10.5 g/dL — ABNORMAL LOW (ref 12.0–15.0)
Lymphocytes Relative: 7 %
Lymphs Abs: 0.6 K/uL — ABNORMAL LOW (ref 0.7–4.0)
MCH: 21.9 pg — ABNORMAL LOW (ref 26.0–34.0)
MCHC: 29.1 g/dL — ABNORMAL LOW (ref 30.0–36.0)
MCV: 75.2 fL — ABNORMAL LOW (ref 80.0–100.0)
Monocytes Absolute: 0.2 K/uL (ref 0.1–1.0)
Monocytes Relative: 2 %
Neutro Abs: 8.4 K/uL — ABNORMAL HIGH (ref 1.7–7.7)
Neutrophils Relative %: 91 %
Platelets: 196 K/uL (ref 150–400)
RBC: 4.8 MIL/uL (ref 3.87–5.11)
RDW: 21.2 % — ABNORMAL HIGH (ref 11.5–15.5)
WBC: 9.2 K/uL (ref 4.0–10.5)
nRBC: 0 % (ref 0.0–0.2)
nRBC: 0 /100{WBCs}

## 2024-01-01 LAB — COMPREHENSIVE METABOLIC PANEL WITH GFR
ALT: 16 U/L (ref 0–44)
AST: 39 U/L (ref 15–41)
Albumin: 3.7 g/dL (ref 3.5–5.0)
Alkaline Phosphatase: 81 U/L (ref 38–126)
Anion gap: 12 (ref 5–15)
BUN: <5 mg/dL — ABNORMAL LOW (ref 6–20)
CO2: 21 mmol/L — ABNORMAL LOW (ref 22–32)
Calcium: 8.7 mg/dL — ABNORMAL LOW (ref 8.9–10.3)
Chloride: 104 mmol/L (ref 98–111)
Creatinine, Ser: 0.56 mg/dL (ref 0.44–1.00)
GFR, Estimated: >60 mL/min (ref >60)
Glucose, Bld: 253 mg/dL — ABNORMAL HIGH (ref 70–99)
Potassium: 4 mmol/L (ref 3.5–5.1)
Sodium: 137 mmol/L (ref 135–145)
Total Bilirubin: 0.6 mg/dL (ref 0.0–1.2)
Total Protein: 7.1 g/dL (ref 6.5–8.1)

## 2024-01-01 LAB — CBG MONITORING, ED: Glucose-Capillary: 331 mg/dL — ABNORMAL HIGH (ref 70–99)

## 2024-01-01 MED ORDER — ONDANSETRON 4 MG PO TBDP
4.0000 mg | ORAL_TABLET | Freq: Once | ORAL | Status: AC
  Administered 2024-01-01: 4 mg via ORAL
  Filled 2024-01-01: qty 1

## 2024-01-01 MED ORDER — NICOTINE 21 MG/24HR TD PT24
21.0000 mg | MEDICATED_PATCH | Freq: Once | TRANSDERMAL | Status: DC
  Administered 2024-01-01: 21 mg via TRANSDERMAL
  Filled 2024-01-01: qty 1

## 2024-01-01 MED ORDER — METHYLPREDNISOLONE SODIUM SUCC 125 MG IJ SOLR
125.0000 mg | Freq: Once | INTRAMUSCULAR | Status: AC
  Administered 2024-01-01: 125 mg via INTRAVENOUS
  Filled 2024-01-01: qty 2

## 2024-01-01 MED ORDER — ACETAMINOPHEN 325 MG PO TABS
650.0000 mg | ORAL_TABLET | Freq: Once | ORAL | Status: AC
  Administered 2024-01-01: 650 mg via ORAL
  Filled 2024-01-01: qty 2

## 2024-01-01 NOTE — TOC CM/SW Note (Signed)
 Per request from patient's daughter [name] [phone]) please notify her if patient's discharged so she is able to set up transportation home.   .Lezly Rumpf, MSW, LCSWA Transition of Care  Clinical Social Worker (ED 3-11 Mon-Fri)  [phone]

## 2024-01-01 NOTE — ED Notes (Signed)
 The patient requested and pad. A maxi pad was provided to the patient.

## 2024-01-01 NOTE — ED Notes (Signed)
 IV access attempted X2 by this RN. Carlyon, RN to attempt access.

## 2024-01-01 NOTE — ED Triage Notes (Signed)
 Pt arrived from home via [facility] c/o hyperglycemia and sob.

## 2024-01-01 NOTE — ED Notes (Signed)
 Patient Alert and oriented to baseline. Stable and ambulatory to baseline. Patient verbalized understanding of the discharge instructions.  Patient belongings were taken by the patient.

## 2024-01-01 NOTE — Discharge Instructions (Signed)
 Make sure to take your metformin . Follow-up with your doctor. Return here for new concerns.

## 2024-01-01 NOTE — ED Provider Notes (Signed)
 [name] EMERGENCY DEPARTMENT AT [facility] Provider Note   CSN: [id number] Arrival date & time: 01/01/24  2041     Patient presents with: Hyperglycemia   [name] is a 58 y.o. female.  {Add pertinent medical, surgical, social history, OB history to YEP:67052} The history is provided by the patient and medical records.  Hyperglycemia  58 year old female with history of alcohol  abuse, cocaine abuse, COPD, diabetes, presenting to the ED with hyperglycemia.  Patient reports she has not taken her metformin  in a few days because she is been dranking.  She states she was feeling SOB but seems to have resolved by time of my evaluation.  She denies any chest pain, abdominal pain, nausea, vomiting, fever, chills, etc.    Prior to Admission medications   Medication Sig Start Date End Date Taking? Authorizing Provider  albuterol  (VENTOLIN  HFA) 108 (90 Base) MCG/ACT inhaler Inhale 2 puffs into the lungs every 4 (four) hours as needed for wheezing or shortness of breath. 11/30/23   Steinl, Kevin, MD  ARIPiprazole  (ABILIFY ) 10 MG tablet Take 1 tablet (10 mg total) by mouth daily. 11/01/23   Samtani, Jai-Gurmukh, MD  divalproex  (DEPAKOTE  SPRINKLE) 125 MG capsule Take 2 capsules (250 mg total) by mouth every 12 (twelve) hours. 11/14/23   Ghimire, Donalda HERO, MD  fluticasone  furoate-vilanterol (BREO ELLIPTA ) 100-25 MCG/ACT AEPB Inhale 1 puff into the lungs daily. 11/14/23   Ghimire, Donalda HERO, MD  folic acid  (FOLVITE ) 1 MG tablet Take 1 tablet (1 mg total) by mouth daily. 12/13/23   Singh, Prashant K, MD  ipratropium-albuterol  (DUONEB) 0.5-2.5 (3) MG/3ML SOLN Take 3 mLs by nebulization every 6 (six) hours as needed (for shortness of breath or wheezing).    [provider]  levETIRAcetam  (KEPPRA  XR) 500 MG 24 hr tablet Take 1 tablet (500 mg total) by mouth 2 (two) times daily. 10/31/23   Samtani, Jai-Gurmukh, MD  metFORMIN  (GLUCOPHAGE -XR) 750 MG 24 hr tablet Take 1 tablet (750 mg  total) by mouth daily with breakfast. 12/13/23   Singh, Prashant K, MD  metoprolol  tartrate (LOPRESSOR ) 25 MG tablet Take 1 tablet (25 mg total) by mouth 2 (two) times daily. 12/13/23   Singh, Prashant K, MD  neomycin -bacitracin -polymyxin [phone] OINT Apply 1 Application topically 3 (three) times daily. Apply to your right nipple 3 times a day for 2 weeks 12/13/23   Singh, Prashant K, MD  nicotine  (NICODERM CQ  - DOSED IN MG/24 HOURS) 21 mg/24hr patch Place 1 patch (21 mg total) onto the skin daily. 12/13/23   Singh, Prashant K, MD  OXYGEN  Inhale 4-5 L/min into the lungs daily as needed (for COPD-related symptoms).    [provider]  pantoprazole  (PROTONIX ) 40 MG tablet Take 1 tablet (40 mg total) by mouth daily. 12/13/23   Dennise Lavada POUR, MD  thiamine  (VITAMIN B-1) 100 MG tablet Take 1 tablet (100 mg total) by mouth daily. 11/14/23   Ghimire, Donalda HERO, MD  umeclidinium bromide  (INCRUSE ELLIPTA ) 62.5 MCG/ACT AEPB Inhale 1 puff into the lungs daily. 11/14/23   Ghimire, Donalda HERO, MD  zonisamide  (ZONEGRAN ) 100 MG capsule Take 2 capsules (200 mg total) by mouth daily. 10/31/23   Samtani, Jai-Gurmukh, MD    Allergies: Iron  dextran, Aspirin , and Penicillins    Review of Systems  Genitourinary:        Hyperglycemia  All other systems reviewed and are negative.   Updated Vital Signs BP 136/85 (BP Location: Right Arm)   Pulse (!) 113  Temp 98.1 F (36.7 C) (Oral)   Resp 19   Ht 5' 2 (1.575 m)   Wt 59 kg   LMP 01/08/2011   SpO2 99%   BMI 23.78 kg/m   Physical Exam Vitals and nursing note reviewed.  Constitutional:      Appearance: She is well-developed.     Comments: Pacing around, appears at baseline  HENT:     Head: Normocephalic and atraumatic.  Eyes:     Conjunctiva/sclera: Conjunctivae normal.     Pupils: Pupils are equal, round, and reactive to light.  Cardiovascular:     Rate and Rhythm: Normal rate and regular rhythm.     Heart sounds: Normal heart sounds.   Pulmonary:     Effort: Pulmonary effort is normal.     Breath sounds: Normal breath sounds.     Comments: O2 sats 98% on RA, no distress Abdominal:     General: Bowel sounds are normal.     Palpations: Abdomen is soft.  Musculoskeletal:        General: Normal range of motion.     Cervical back: Normal range of motion.  Skin:    General: Skin is warm and dry.  Neurological:     Mental Status: She is alert and oriented to person, place, and time.     (all labs ordered are listed, but only abnormal results are displayed) Labs Reviewed  CBC - Abnormal; Notable for the following components:      Result Value   Hemoglobin 10.0 (*)    HCT 33.7 (*)    MCV 74.7 (*)    MCH 22.2 (*)    MCHC 29.7 (*)    RDW 20.5 (*)    All other components within normal limits  URINALYSIS, ROUTINE W REFLEX MICROSCOPIC - Abnormal; Notable for the following components:   APPearance HAZY (*)    Glucose, UA >=500 (*)    All other components within normal limits  CBG MONITORING, ED - Abnormal; Notable for the following components:   Glucose-Capillary 331 (*)    All other components within normal limits  I-STAT CHEM 8, ED - Abnormal; Notable for the following components:   Glucose, Bld 251 (*)    TCO2 19 (*)    All other components within normal limits  CBG MONITORING, ED    EKG: None  Radiology: DG Chest 2 View Result Date: 01/01/2024 CLINICAL DATA:  Wheezing. EXAM: CHEST - 2 VIEW COMPARISON:  12/18/2023 FINDINGS: The lungs are clear without focal pneumonia, edema, pneumothorax or substantial pleural effusion. The cardiopericardial silhouette is within normal limits for size. No acute bony abnormality. Telemetry leads overlie the chest. IMPRESSION: No active cardiopulmonary disease. Electronically Signed   By: Camellia Candle M.D.   On: 01/01/2024 07:56    {Document cardiac monitor, telemetry assessment procedure when appropriate:32947} Procedures   Medications Ordered in the ED - No data to  display    {Click here for ABCD2, HEART and other calculators REFRESH Note before signing:1}                              Medical Decision Making Amount and/or Complexity of Data Reviewed Labs: ordered.   ***  {Document critical care time when appropriate  Document review of labs and clinical decision tools ie CHADS2VASC2, etc  Document your independent review of radiology images and any outside records  Document your discussion with family members, caretakers and with consultants  Document social determinants of health affecting pt's care  Document your decision making why or why not admission, treatments were needed:32947:::1}   Final diagnoses:  None    ED Discharge Orders     None

## 2024-01-01 NOTE — ED Triage Notes (Signed)
 Pt bib [facility] from home with complaints of shob after smoking a cigarette this am. Pt started on a duo neb en route with little relief. AOx4

## 2024-01-01 NOTE — ED Provider Notes (Signed)
 [place] EMERGENCY DEPARTMENT AT [facility] Provider Note   CSN: [id number] Arrival date & time: 01/01/24  9355     Patient presents with: Shortness of Breath   [name] is a 58 y.o. female.   Patient complains of shortness of breath.  Patient was brought in by EMS after becoming short of breath.  EMS reports patient told him it was after smoking a cigarette.  Patient has a past medical history of COPD and diabetes.  Patient has home O2.  Patient reports using her inhalers and using her home O2 without relief.  Patient reports very frequent exacerbations of COPD.  Patient reports that she has had a cough today.  Patient denies any fever or chills.  Patient reports she has not had any fever or chills.  Denies any exposure to anyone with any illness.  The history is provided by the patient. No language interpreter was used.  Shortness of Breath Severity:  Severe Duration:  1 day Timing:  Constant Progression:  Improving Chronicity:  Recurrent Context: smoke exposure        Prior to Admission medications   Medication Sig Start Date End Date Taking? Authorizing Provider  albuterol  (VENTOLIN  HFA) 108 (90 Base) MCG/ACT inhaler Inhale 2 puffs into the lungs every 4 (four) hours as needed for wheezing or shortness of breath. 11/30/23   Steinl, Kevin, MD  ARIPiprazole  (ABILIFY ) 10 MG tablet Take 1 tablet (10 mg total) by mouth daily. 11/01/23   Samtani, Jai-Gurmukh, MD  divalproex  (DEPAKOTE  SPRINKLE) 125 MG capsule Take 2 capsules (250 mg total) by mouth every 12 (twelve) hours. 11/14/23   Ghimire, Donalda HERO, MD  fluticasone  furoate-vilanterol (BREO ELLIPTA ) 100-25 MCG/ACT AEPB Inhale 1 puff into the lungs daily. 11/14/23   Ghimire, Donalda HERO, MD  folic acid  (FOLVITE ) 1 MG tablet Take 1 tablet (1 mg total) by mouth daily. 12/13/23   Singh, Prashant K, MD  ipratropium-albuterol  (DUONEB) 0.5-2.5 (3) MG/3ML SOLN Take 3 mLs by nebulization every 6 (six) hours as needed (for  shortness of breath or wheezing).    [provider]  levETIRAcetam  (KEPPRA  XR) 500 MG 24 hr tablet Take 1 tablet (500 mg total) by mouth 2 (two) times daily. 10/31/23   Samtani, Jai-Gurmukh, MD  metFORMIN  (GLUCOPHAGE -XR) 750 MG 24 hr tablet Take 1 tablet (750 mg total) by mouth daily with breakfast. 12/13/23   Singh, Prashant K, MD  metoprolol  tartrate (LOPRESSOR ) 25 MG tablet Take 1 tablet (25 mg total) by mouth 2 (two) times daily. 12/13/23   Singh, Prashant K, MD  neomycin -bacitracin -polymyxin [phone] OINT Apply 1 Application topically 3 (three) times daily. Apply to your right nipple 3 times a day for 2 weeks 12/13/23   Singh, Prashant K, MD  nicotine  (NICODERM CQ  - DOSED IN MG/24 HOURS) 21 mg/24hr patch Place 1 patch (21 mg total) onto the skin daily. 12/13/23   Singh, Prashant K, MD  OXYGEN  Inhale 4-5 L/min into the lungs daily as needed (for COPD-related symptoms).    [provider]  pantoprazole  (PROTONIX ) 40 MG tablet Take 1 tablet (40 mg total) by mouth daily. 12/13/23   Dennise Lavada POUR, MD  thiamine  (VITAMIN B-1) 100 MG tablet Take 1 tablet (100 mg total) by mouth daily. 11/14/23   Ghimire, Donalda HERO, MD  umeclidinium bromide  (INCRUSE ELLIPTA ) 62.5 MCG/ACT AEPB Inhale 1 puff into the lungs daily. 11/14/23   Ghimire, Donalda HERO, MD  zonisamide  (ZONEGRAN ) 100 MG capsule Take 2 capsules (200 mg total) by  mouth daily. 10/31/23   Samtani, Jai-Gurmukh, MD    Allergies: Iron  dextran, Aspirin , and Penicillins    Review of Systems  Respiratory:  Positive for shortness of breath.   All other systems reviewed and are negative.   Updated Vital Signs BP (!) 131/106   Pulse (!) 121   Resp (!) 24   LMP 01/08/2011   SpO2 100%   Physical Exam Vitals and nursing note reviewed.  Constitutional:      Appearance: She is well-developed.  HENT:     Head: Normocephalic.  Cardiovascular:     Rate and Rhythm: Tachycardia present.  Pulmonary:     Effort: Pulmonary effort is  normal. Tachypnea present.     Breath sounds: Wheezing present.  Chest:     Chest wall: No mass.  Abdominal:     General: There is no distension.  Musculoskeletal:        General: Normal range of motion.     Cervical back: Normal range of motion.  Skin:    General: Skin is warm.  Neurological:     Mental Status: She is alert and oriented to person, place, and time.     (all labs ordered are listed, but only abnormal results are displayed) Labs Reviewed  CBC WITH DIFFERENTIAL/PLATELET  COMPREHENSIVE METABOLIC PANEL WITH GFR    EKG: None  Radiology: DG Chest 2 View Result Date: 01/01/2024 CLINICAL DATA:  Wheezing. EXAM: CHEST - 2 VIEW COMPARISON:  12/18/2023 FINDINGS: The lungs are clear without focal pneumonia, edema, pneumothorax or substantial pleural effusion. The cardiopericardial silhouette is within normal limits for size. No acute bony abnormality. Telemetry leads overlie the chest. IMPRESSION: No active cardiopulmonary disease. Electronically Signed   By: Camellia Candle M.D.   On: 01/01/2024 07:56     Procedures   Medications Ordered in the ED  methylPREDNISolone  sodium succinate  (SOLU-MEDROL ) 125 mg/2 mL injection 125 mg (125 mg Intravenous Given 01/01/24 0802)                                    Medical Decision Making Patient has a past medical history of COPD with frequent exacerbations.  Amount and/or Complexity of Data Reviewed Independent Historian: EMS    Details: EMS reports patient was wheezing.  Patient was given a DuoNeb on the way here which improved symptoms. External Data Reviewed: notes.    Details: Previous hospital admission/discharge notes reviewed Labs: ordered. Decision-making details documented in ED Course.    Details: Labs ordered reviewed and interpreted.  Glucose is elevated at 253.  Hemoglobin is 10.5 Radiology: ordered and independent interpretation performed. Decision-making details documented in ED Course.    Details: Chest x-ray  ordered reviewed and interpreted no acute findings ECG/medicine tests: ordered and independent interpretation performed. Decision-making details documented in ED Course.    Details: EKG ordered reviewed and interpreted EKG shows sinus tachycardia.  Patient has just received large amount of albuterol   Risk OTC drugs. Prescription drug management. Risk Details: Patient is given a second DuoNeb while in the emergency department.  Patient is given Solu-Medrol  125 mg IV.  Patient reports feeling much better she is off of oxygen  and satting 100%.  Patient feels much better and feels like going home.  Patient counseled on the importance of not smoking.  She is advised to continue her home medications.        Final diagnoses:  COPD exacerbation (HCC)  ED Discharge Orders     None       An After Visit Summary was printed and given to the patient.    Flint Sonny POUR, PA-C 01/01/24 1032    Ula Prentice SAUNDERS, MD 01/01/24 [phone]

## 2024-01-01 NOTE — ED Notes (Signed)
 Pt discharged. POA called. Pt given discharge papers and papers explained. Pt in NAD at this time

## 2024-01-21 ENCOUNTER — Emergency Department (HOSPITAL_COMMUNITY): Admission: EM | Admit: 2024-01-21 | Discharge: 2024-01-21 | Discharge status: Against Medical Advice

## 2024-01-21 ENCOUNTER — Emergency Department (HOSPITAL_COMMUNITY)

## 2024-01-21 ENCOUNTER — Other Ambulatory Visit: Payer: Self-pay

## 2024-01-21 ENCOUNTER — Encounter (HOSPITAL_COMMUNITY): Payer: Self-pay

## 2024-01-21 DIAGNOSIS — Z7951 Long term (current) use of inhaled steroids: Secondary | ICD-10-CM | POA: Insufficient documentation

## 2024-01-21 DIAGNOSIS — Z5329 Procedure and treatment not carried out because of patient's decision for other reasons: Secondary | ICD-10-CM | POA: Diagnosis not present

## 2024-01-21 DIAGNOSIS — J449 Chronic obstructive pulmonary disease, unspecified: Secondary | ICD-10-CM | POA: Diagnosis not present

## 2024-01-21 DIAGNOSIS — E119 Type 2 diabetes mellitus without complications: Secondary | ICD-10-CM | POA: Diagnosis not present

## 2024-01-21 DIAGNOSIS — R0602 Shortness of breath: Secondary | ICD-10-CM

## 2024-01-21 DIAGNOSIS — Z7984 Long term (current) use of oral hypoglycemic drugs: Secondary | ICD-10-CM | POA: Diagnosis not present

## 2024-01-21 MED ORDER — IPRATROPIUM-ALBUTEROL 0.5-2.5 (3) MG/3ML IN SOLN
3.0000 mL | Freq: Once | RESPIRATORY_TRACT | Status: AC
  Administered 2024-01-21: 3 mL via RESPIRATORY_TRACT
  Filled 2024-01-21: qty 3

## 2024-01-21 NOTE — ED Notes (Signed)
 ED Provider at bedside.

## 2024-01-21 NOTE — ED Provider Notes (Signed)
 [place] EMERGENCY DEPARTMENT AT [facility] Provider Note   CSN: [id number] Arrival date & time: 01/21/24  8594     Patient presents with: Anxiety  HPI [name] is a 58 y.o. female with COPD, seizure, cocaine use, type 2 diabetes presenting for shortness of breath.  Started this morning.  It was subtle at first and gradually worsened throughout the day.  She states that she did smoke some cigarettes this morning and drank a 40 ounce beer. States she had a bit of chest pain when she first arrived to the hospital but she states the chest pain has resolved.  She also endorses some wheezing.  Denies calf tenderness or swelling, recent long trips or immobilization, known history of blood clot.  Denies lower extremity edema.    Anxiety       Prior to Admission medications   Medication Sig Start Date End Date Taking? Authorizing Provider  albuterol  (VENTOLIN  HFA) 108 (90 Base) MCG/ACT inhaler Inhale 2 puffs into the lungs every 4 (four) hours as needed for wheezing or shortness of breath. 11/30/23   Steinl, Kevin, MD  ARIPiprazole  (ABILIFY ) 10 MG tablet Take 1 tablet (10 mg total) by mouth daily. 11/01/23   Samtani, Jai-Gurmukh, MD  divalproex  (DEPAKOTE  SPRINKLE) 125 MG capsule Take 2 capsules (250 mg total) by mouth every 12 (twelve) hours. 11/14/23   Ghimire, Donalda HERO, MD  fluticasone  furoate-vilanterol (BREO ELLIPTA ) 100-25 MCG/ACT AEPB Inhale 1 puff into the lungs daily. 11/14/23   Ghimire, Donalda HERO, MD  folic acid  (FOLVITE ) 1 MG tablet Take 1 tablet (1 mg total) by mouth daily. 12/13/23   Singh, Prashant K, MD  ipratropium-albuterol  (DUONEB) 0.5-2.5 (3) MG/3ML SOLN Take 3 mLs by nebulization every 6 (six) hours as needed (for shortness of breath or wheezing).    [provider]  levETIRAcetam  (KEPPRA  XR) 500 MG 24 hr tablet Take 1 tablet (500 mg total) by mouth 2 (two) times daily. 10/31/23   Samtani, Jai-Gurmukh, MD  metFORMIN  (GLUCOPHAGE -XR) 750 MG 24 hr tablet  Take 1 tablet (750 mg total) by mouth daily with breakfast. 12/13/23   Singh, Prashant K, MD  metoprolol  tartrate (LOPRESSOR ) 25 MG tablet Take 1 tablet (25 mg total) by mouth 2 (two) times daily. 12/13/23   Singh, Prashant K, MD  neomycin -bacitracin -polymyxin [phone] OINT Apply 1 Application topically 3 (three) times daily. Apply to your right nipple 3 times a day for 2 weeks 12/13/23   Singh, Prashant K, MD  nicotine  (NICODERM CQ  - DOSED IN MG/24 HOURS) 21 mg/24hr patch Place 1 patch (21 mg total) onto the skin daily. 12/13/23   Singh, Prashant K, MD  OXYGEN  Inhale 4-5 L/min into the lungs daily as needed (for COPD-related symptoms).    [provider]  pantoprazole  (PROTONIX ) 40 MG tablet Take 1 tablet (40 mg total) by mouth daily. 12/13/23   Singh, Prashant K, MD  thiamine  (VITAMIN B-1) 100 MG tablet Take 1 tablet (100 mg total) by mouth daily. 11/14/23   Ghimire, Donalda HERO, MD  umeclidinium bromide  (INCRUSE ELLIPTA ) 62.5 MCG/ACT AEPB Inhale 1 puff into the lungs daily. 11/14/23   Ghimire, Donalda HERO, MD  zonisamide  (ZONEGRAN ) 100 MG capsule Take 2 capsules (200 mg total) by mouth daily. 10/31/23   Samtani, Jai-Gurmukh, MD    Allergies: Iron  dextran, Aspirin , and Penicillins    Review of Systems See HPI  Updated Vital Signs BP 91/60 (BP Location: Right Arm)   Pulse 84   Temp 98.3 F (36.8  C) (Oral)   Resp 20   LMP 01/08/2011   SpO2 99%   Physical Exam Vitals and nursing note reviewed.  HENT:     Head: Normocephalic and atraumatic.     Mouth/Throat:     Mouth: Mucous membranes are moist.  Eyes:     General:        Right eye: No discharge.        Left eye: No discharge.     Conjunctiva/sclera: Conjunctivae normal.  Cardiovascular:     Rate and Rhythm: Normal rate and regular rhythm.     Pulses: Normal pulses.     Heart sounds: Normal heart sounds.  Pulmonary:     Effort: Pulmonary effort is normal.     Breath sounds: Wheezing and rhonchi present. No decreased breath  sounds or rales.  Abdominal:     General: Abdomen is flat.     Palpations: Abdomen is soft.  Musculoskeletal:     Right lower leg: No edema.     Left lower leg: No edema.  Skin:    General: Skin is warm and dry.  Neurological:     General: No focal deficit present.  Psychiatric:        Mood and Affect: Mood normal.     (all labs ordered are listed, but only abnormal results are displayed) Labs Reviewed  BASIC METABOLIC PANEL WITH GFR  BRAIN NATRIURETIC PEPTIDE  CBC WITH DIFFERENTIAL/PLATELET  TROPONIN I (HIGH SENSITIVITY)  TROPONIN I (HIGH SENSITIVITY)    EKG: EKG Interpretation Date/Time:  Thursday January 21 2024 14:07:30 EDT Ventricular Rate:  76 PR Interval:  158 QRS Duration:  86 QT Interval:  378 QTC Calculation: 425 R Axis:   64  Text Interpretation: Normal sinus rhythm Normal ECG When compared with ECG of 01-Jan-2024 20:56, PREVIOUS ECG IS PRESENT Confirmed by Lenor Hollering [phone]) on 01/21/2024 2:23:18 PM  Radiology: DG Chest 2 View Result Date: 01/21/2024 CLINICAL DATA:  Shortness of breath. EXAM: CHEST - 2 VIEW COMPARISON:  01/01/2024. FINDINGS: There is faint opacity overlying the right mid lung zone, which is of indeterminate origin. This is not well seen on the lateral film. This may be external, soft tissue calcification or due to focal lung parenchymal opacity. Correlate clinically. Bilateral lung fields are otherwise clear. Bilateral costophrenic angles are clear. Normal cardio-mediastinal silhouette. No acute osseous abnormalities. The soft tissues are within normal limits. IMPRESSION: Faint opacity overlying the right mid lung zone, as described above. Otherwise no acute cardiopulmonary abnormality. Electronically Signed   By: Ree Molt M.D.   On: 01/21/2024 14:55     Procedures   Medications Ordered in the ED  ipratropium-albuterol  (DUONEB) 0.5-2.5 (3) MG/3ML nebulizer solution 3 mL (3 mLs Nebulization Given 01/21/24 1519)                                     Medical Decision Making Risk Prescription drug management.   58 year old well-appearing female presenting for shortness of breath.  Exam notable for diffuse rhonchi and wheezing but no crackles.  Vitals are normal.  She looks well, no acute distress and hemodynamically stable.  Suspect COPD exacerbation but also considering PE, ACS, pneumothorax.  X-ray revealed faint opacity overlying the right midlung zone but otherwise no acute cardiopulmonary process.  Treated with DuoNeb.  Labs are pending.  Unfortunately patient left AMA.      Final diagnoses:  Shortness of breath  ED Discharge Orders     None          Lang Norleen POUR, PA-C 01/21/24 1611    Kammerer, Duwaine CROME, DO 01/27/24 [phone]

## 2024-01-21 NOTE — ED Notes (Addendum)
 Pt refusing further workup despite this RN attempting to educate patient multiple times on importance of labs. Patient states I ain't gonna keep letting y'all stick me. If you can't find it, you can't find it. I was just here two days ago. Y'all not getting any more from me. Grayce, PA-C notified and aware of patient wanting to leave and sign AMA form. Patient signed AMA form. [name] (POA) notified and has arranged for transportation to take patient home.

## 2024-01-21 NOTE — ED Notes (Signed)
 Pt states she fell and her ribs hurt on her left side.

## 2024-01-21 NOTE — ED Notes (Signed)
 Patient refusing blood work at this time. EDP notified.

## 2024-01-21 NOTE — ED Triage Notes (Signed)
 [facility] reports pt coming from home. Pt states she became sob while walking to her moms house while smoking her cigarette and drinking her 40 ounce. Pt then began to c/o chest pain upon arriving to hospital.

## 2024-01-21 NOTE — ED Notes (Signed)
 Pt ambulatory without assistance to bathroom.  ?

## 2024-01-21 NOTE — ED Notes (Signed)
 Patient repeatedly requesting to eat something. This RN has explained many times to patient that she would have to see the EDP first.

## 2024-01-21 NOTE — ED Notes (Signed)
 Unsuccessful attempt at blood draw. EDP to be notified once assigned.

## 2024-01-23 ENCOUNTER — Other Ambulatory Visit: Payer: Self-pay

## 2024-01-23 ENCOUNTER — Emergency Department (HOSPITAL_COMMUNITY)

## 2024-01-23 ENCOUNTER — Observation Stay (HOSPITAL_COMMUNITY)
Admission: EM | Admit: 2024-01-23 | Discharge: 2024-01-24 | Discharge status: Against Medical Advice | Attending: Internal Medicine | Admitting: Internal Medicine

## 2024-01-23 ENCOUNTER — Encounter (HOSPITAL_COMMUNITY): Payer: Self-pay

## 2024-01-23 DIAGNOSIS — F1020 Alcohol dependence, uncomplicated: Secondary | ICD-10-CM | POA: Diagnosis present

## 2024-01-23 DIAGNOSIS — K760 Fatty (change of) liver, not elsewhere classified: Secondary | ICD-10-CM | POA: Insufficient documentation

## 2024-01-23 DIAGNOSIS — E1142 Type 2 diabetes mellitus with diabetic polyneuropathy: Secondary | ICD-10-CM | POA: Diagnosis not present

## 2024-01-23 DIAGNOSIS — D649 Anemia, unspecified: Secondary | ICD-10-CM | POA: Insufficient documentation

## 2024-01-23 DIAGNOSIS — F325 Major depressive disorder, single episode, in full remission: Secondary | ICD-10-CM | POA: Diagnosis not present

## 2024-01-23 DIAGNOSIS — J441 Chronic obstructive pulmonary disease with (acute) exacerbation: Secondary | ICD-10-CM | POA: Diagnosis not present

## 2024-01-23 DIAGNOSIS — Z1152 Encounter for screening for COVID-19: Secondary | ICD-10-CM | POA: Diagnosis not present

## 2024-01-23 DIAGNOSIS — F19920 Other psychoactive substance use, unspecified with intoxication, uncomplicated: Secondary | ICD-10-CM | POA: Insufficient documentation

## 2024-01-23 DIAGNOSIS — F14920 Cocaine use, unspecified with intoxication, uncomplicated: Secondary | ICD-10-CM | POA: Diagnosis not present

## 2024-01-23 DIAGNOSIS — F1910 Other psychoactive substance abuse, uncomplicated: Secondary | ICD-10-CM | POA: Insufficient documentation

## 2024-01-23 DIAGNOSIS — E872 Acidosis, unspecified: Secondary | ICD-10-CM | POA: Insufficient documentation

## 2024-01-23 DIAGNOSIS — F12920 Cannabis use, unspecified with intoxication, uncomplicated: Secondary | ICD-10-CM | POA: Diagnosis not present

## 2024-01-23 DIAGNOSIS — R569 Unspecified convulsions: Secondary | ICD-10-CM | POA: Diagnosis not present

## 2024-01-23 DIAGNOSIS — R062 Wheezing: Secondary | ICD-10-CM | POA: Diagnosis present

## 2024-01-23 DIAGNOSIS — Z794 Long term (current) use of insulin: Secondary | ICD-10-CM

## 2024-01-23 LAB — URINALYSIS, W/ REFLEX TO CULTURE (INFECTION SUSPECTED)
Bilirubin Urine: NEGATIVE
Glucose, UA: NEGATIVE mg/dL
Hgb urine dipstick: NEGATIVE
Ketones, ur: NEGATIVE mg/dL
Leukocytes,Ua: NEGATIVE
Nitrite: NEGATIVE
Protein, ur: NEGATIVE mg/dL
Specific Gravity, Urine: 1.002 — ABNORMAL LOW (ref 1.005–1.030)
pH: 5 (ref 5.0–8.0)

## 2024-01-23 LAB — COMPREHENSIVE METABOLIC PANEL WITH GFR
ALT: 18 U/L (ref 0–44)
ALT: 18 U/L (ref 0–44)
AST: 28 U/L (ref 15–41)
AST: 32 U/L (ref 15–41)
Albumin: 3.5 g/dL (ref 3.5–5.0)
Albumin: 3.4 g/dL — ABNORMAL LOW (ref 3.5–5.0)
Alkaline Phosphatase: 88 U/L (ref 38–126)
Alkaline Phosphatase: 86 U/L (ref 38–126)
Anion gap: 16 — ABNORMAL HIGH (ref 5–15)
Anion gap: 14 (ref 5–15)
BUN: 9 mg/dL (ref 6–20)
BUN: 7 mg/dL (ref 6–20)
CO2: 20 mmol/L — ABNORMAL LOW (ref 22–32)
CO2: 19 mmol/L — ABNORMAL LOW (ref 22–32)
Calcium: 8.6 mg/dL — ABNORMAL LOW (ref 8.9–10.3)
Calcium: 8.4 mg/dL — ABNORMAL LOW (ref 8.9–10.3)
Chloride: 102 mmol/L (ref 98–111)
Chloride: 101 mmol/L (ref 98–111)
Creatinine, Ser: 0.58 mg/dL (ref 0.44–1.00)
Creatinine, Ser: 0.74 mg/dL (ref 0.44–1.00)
GFR, Estimated: >60 mL/min (ref >60)
GFR, Estimated: >60 mL/min (ref >60)
Glucose, Bld: 126 mg/dL — ABNORMAL HIGH (ref 70–99)
Glucose, Bld: 270 mg/dL — ABNORMAL HIGH (ref 70–99)
Potassium: 3.6 mmol/L (ref 3.5–5.1)
Potassium: 3.5 mmol/L (ref 3.5–5.1)
Sodium: 138 mmol/L (ref 135–145)
Sodium: 134 mmol/L — ABNORMAL LOW (ref 135–145)
Total Bilirubin: 0.9 mg/dL (ref 0.0–1.2)
Total Bilirubin: 0.5 mg/dL (ref 0.0–1.2)
Total Protein: 6.8 g/dL (ref 6.5–8.1)
Total Protein: 6.6 g/dL (ref 6.5–8.1)

## 2024-01-23 LAB — LIPASE, BLOOD: Lipase: 45 U/L (ref 11–51)

## 2024-01-23 LAB — I-STAT CG4 LACTIC ACID, ED
Lactic Acid, Venous: 4.3 mmol/L (ref 0.5–1.9)
Lactic Acid, Venous: 6 mmol/L (ref 0.5–1.9)
Lactic Acid, Venous: 6.4 mmol/L (ref 0.5–1.9)

## 2024-01-23 LAB — I-STAT VENOUS BLOOD GAS, ED
Acid-base deficit: 5 mmol/L — ABNORMAL HIGH (ref 0.0–2.0)
Bicarbonate: 19.3 mmol/L — ABNORMAL LOW (ref 20.0–28.0)
Calcium, Ion: 1.08 mmol/L — ABNORMAL LOW (ref 1.15–1.40)
HCT: 34 % — ABNORMAL LOW (ref 36.0–46.0)
Hemoglobin: 11.6 g/dL — ABNORMAL LOW (ref 12.0–15.0)
O2 Saturation: 82 %
Potassium: 3.6 mmol/L (ref 3.5–5.1)
Sodium: 135 mmol/L (ref 135–145)
TCO2: 20 mmol/L — ABNORMAL LOW (ref 22–32)
pCO2, Ven: 34 mmHg — ABNORMAL LOW (ref 44–60)
pH, Ven: 7.362 (ref 7.25–7.43)
pO2, Ven: 48 mmHg — ABNORMAL HIGH (ref 32–45)

## 2024-01-23 LAB — CBC WITH DIFFERENTIAL/PLATELET
Abs Immature Granulocytes: 0 K/uL (ref 0.00–0.07)
Basophils Absolute: 0 K/uL (ref 0.0–0.1)
Basophils Relative: 1 %
Eosinophils Absolute: 0 K/uL (ref 0.0–0.5)
Eosinophils Relative: 0 %
HCT: 34.3 % — ABNORMAL LOW (ref 36.0–46.0)
Hemoglobin: 10.3 g/dL — ABNORMAL LOW (ref 12.0–15.0)
Immature Granulocytes: 0 %
Lymphocytes Relative: 36 %
Lymphs Abs: 2 K/uL (ref 0.7–4.0)
MCH: 22 pg — ABNORMAL LOW (ref 26.0–34.0)
MCHC: 30 g/dL (ref 30.0–36.0)
MCV: 73.3 fL — ABNORMAL LOW (ref 80.0–100.0)
Monocytes Absolute: 0.4 K/uL (ref 0.1–1.0)
Monocytes Relative: 7 %
Neutro Abs: 3.2 K/uL (ref 1.7–7.7)
Neutrophils Relative %: 56 %
Platelets: 236 K/uL (ref 150–400)
RBC: 4.68 MIL/uL (ref 3.87–5.11)
RDW: 20.1 % — ABNORMAL HIGH (ref 11.5–15.5)
WBC: 5.6 K/uL (ref 4.0–10.5)
nRBC: 0 % (ref 0.0–0.2)

## 2024-01-23 LAB — RESP PANEL BY RT-PCR (RSV, FLU A&B, COVID)  RVPGX2
Influenza A by PCR: NEGATIVE
Influenza B by PCR: NEGATIVE
Resp Syncytial Virus by PCR: NEGATIVE
SARS Coronavirus 2 by RT PCR: NEGATIVE

## 2024-01-23 LAB — TROPONIN I (HIGH SENSITIVITY)
Troponin I (High Sensitivity): 3 ng/L (ref <18)
Troponin I (High Sensitivity): 3 ng/L (ref <18)

## 2024-01-23 LAB — BRAIN NATRIURETIC PEPTIDE: B Natriuretic Peptide: 17 pg/mL (ref 0.0–100.0)

## 2024-01-23 MED ORDER — METHYLPREDNISOLONE SODIUM SUCC 125 MG IJ SOLR
125.0000 mg | Freq: Once | INTRAMUSCULAR | Status: AC
  Administered 2024-01-23: 125 mg via INTRAVENOUS
  Filled 2024-01-23: qty 2

## 2024-01-23 MED ORDER — IPRATROPIUM-ALBUTEROL 0.5-2.5 (3) MG/3ML IN SOLN
3.0000 mL | RESPIRATORY_TRACT | Status: AC
  Administered 2024-01-23 (×3): 3 mL via RESPIRATORY_TRACT
  Filled 2024-01-23 (×3): qty 3

## 2024-01-23 MED ORDER — MORPHINE SULFATE (PF) 2 MG/ML IV SOLN
2.0000 mg | Freq: Once | INTRAVENOUS | Status: AC
  Administered 2024-01-23: 2 mg via INTRAVENOUS
  Filled 2024-01-23: qty 1

## 2024-01-23 MED ORDER — DIPHENHYDRAMINE HCL 50 MG/ML IJ SOLN
25.0000 mg | Freq: Once | INTRAMUSCULAR | Status: AC
  Administered 2024-01-23: 25 mg via INTRAVENOUS
  Filled 2024-01-23: qty 1

## 2024-01-23 MED ORDER — ONDANSETRON HCL 4 MG/2ML IJ SOLN
4.0000 mg | Freq: Once | INTRAMUSCULAR | Status: AC
  Administered 2024-01-23: 4 mg via INTRAVENOUS
  Filled 2024-01-23: qty 2

## 2024-01-23 MED ORDER — IPRATROPIUM-ALBUTEROL 0.5-2.5 (3) MG/3ML IN SOLN
3.0000 mL | RESPIRATORY_TRACT | Status: AC
  Administered 2024-01-23 – 2024-01-24 (×3): 3 mL via RESPIRATORY_TRACT
  Filled 2024-01-23: qty 6
  Filled 2024-01-23: qty 3

## 2024-01-23 MED ORDER — IOHEXOL 350 MG/ML SOLN
75.0000 mL | Freq: Once | INTRAVENOUS | Status: AC | PRN
  Administered 2024-01-23: 75 mL via INTRAVENOUS

## 2024-01-23 MED ORDER — METOCLOPRAMIDE HCL 5 MG/ML IJ SOLN
10.0000 mg | Freq: Once | INTRAMUSCULAR | Status: AC
  Administered 2024-01-23: 10 mg via INTRAVENOUS
  Filled 2024-01-23: qty 2

## 2024-01-23 MED ORDER — DIAZEPAM 5 MG/ML IJ SOLN
2.5000 mg | Freq: Once | INTRAMUSCULAR | Status: AC
  Administered 2024-01-23: 2.5 mg via INTRAVENOUS
  Filled 2024-01-23: qty 2

## 2024-01-23 MED ORDER — NICOTINE 21 MG/24HR TD PT24
21.0000 mg | MEDICATED_PATCH | Freq: Once | TRANSDERMAL | Status: DC
  Administered 2024-01-23: 21 mg via TRANSDERMAL
  Filled 2024-01-23: qty 1

## 2024-01-23 MED ORDER — LACTATED RINGERS IV BOLUS
1000.0000 mL | Freq: Once | INTRAVENOUS | Status: AC
  Administered 2024-01-23: 1000 mL via INTRAVENOUS

## 2024-01-23 NOTE — ED Triage Notes (Signed)
 Pt arrived with EMS c/o dyspnea with mild wheezing EMS treated with 5ml albuterol . Resolved weezing.

## 2024-01-23 NOTE — ED Provider Notes (Signed)
 [place] EMERGENCY DEPARTMENT AT [facility] Provider Note   CSN: [id number] Arrival date & time: 01/23/24  1731     Patient presents with: Shortness of Breath   [name] is a 58 y.o. female.  {Add pertinent medical, surgical, social history, OB history to HPI:32947} HPI     58 year old female with a history of COPD, seizure, cocaine use, type 2 diabetes, atrial fibrillation, hyperlipidemia, hypertension, schizophrenia, recent emergency department with shortness of breath who presents with concern for dyspnea.  Oxygen  machine at home, still feeling worsening dyspnea and called 911.  3 or 4 started feeling worse, used inhaler And did seemed worse Felt somewhat better after albuterol  on the way here Coughing, nonproductive Nausea and vomiting today as well. Had water  and then came up again No fever known CP a little bit, feels like sensation of nausea, hunger  No leg pain or swelling Not not blood thinners No hx of dvt or PE      Lost husband a few months ago  Past Medical History:  Diagnosis Date   Abdominal pain    Accidental drug overdose April 2013   Anxiety    Atrial fibrillation (HCC) 09/29/11   converted spontaneously   Chronic back pain    Chronic knee pain    Chronic nausea    Chronic pain    COPD (chronic obstructive pulmonary disease) (HCC)    Depression    Diabetes mellitus    states her doctor took her off all DM meds in past month   Diabetic neuropathy (HCC)    Dyspnea    with exertion    GERD (gastroesophageal reflux disease)    Headache(784.0)    migraines    HTN (hypertension)    not on meds since in a year    Hyperlipidemia    Hypothyroidism    not on meds in a while    Mental disorder    Bipolar and schizophrenic   Nausea and vomiting 01/02/2023   Requires supplemental oxygen     as needed per patient    Schizophrenia (HCC)    Schizophrenia, acute (HCC) 11/13/2017   Tobacco abuse      Prior to Admission medications    Medication Sig Start Date End Date Taking? Authorizing Provider  albuterol  (VENTOLIN  HFA) 108 (90 Base) MCG/ACT inhaler Inhale 2 puffs into the lungs every 4 (four) hours as needed for wheezing or shortness of breath. 11/30/23   Steinl, Kevin, MD  ARIPiprazole  (ABILIFY ) 10 MG tablet Take 1 tablet (10 mg total) by mouth daily. 11/01/23   Samtani, Jai-Gurmukh, MD  divalproex  (DEPAKOTE  SPRINKLE) 125 MG capsule Take 2 capsules (250 mg total) by mouth every 12 (twelve) hours. 11/14/23   Ghimire, Donalda HERO, MD  fluticasone  furoate-vilanterol (BREO ELLIPTA ) 100-25 MCG/ACT AEPB Inhale 1 puff into the lungs daily. 11/14/23   Ghimire, Donalda HERO, MD  folic acid  (FOLVITE ) 1 MG tablet Take 1 tablet (1 mg total) by mouth daily. 12/13/23   Singh, Prashant K, MD  ipratropium-albuterol  (DUONEB) 0.5-2.5 (3) MG/3ML SOLN Take 3 mLs by nebulization every 6 (six) hours as needed (for shortness of breath or wheezing).    [provider]  levETIRAcetam  (KEPPRA  XR) 500 MG 24 hr tablet Take 1 tablet (500 mg total) by mouth 2 (two) times daily. 10/31/23   Samtani, Jai-Gurmukh, MD  metFORMIN  (GLUCOPHAGE -XR) 750 MG 24 hr tablet Take 1 tablet (750 mg total) by mouth daily with breakfast. 12/13/23   Singh, Prashant K, MD  metoprolol  tartrate (LOPRESSOR ) 25 MG tablet Take 1 tablet (25 mg total) by mouth 2 (two) times daily. 12/13/23   Singh, Prashant K, MD  neomycin -bacitracin -polymyxin [phone] OINT Apply 1 Application topically 3 (three) times daily. Apply to your right nipple 3 times a day for 2 weeks 12/13/23   Singh, Prashant K, MD  nicotine  (NICODERM CQ  - DOSED IN MG/24 HOURS) 21 mg/24hr patch Place 1 patch (21 mg total) onto the skin daily. 12/13/23   Singh, Prashant K, MD  OXYGEN  Inhale 4-5 L/min into the lungs daily as needed (for COPD-related symptoms).    [provider]  pantoprazole  (PROTONIX ) 40 MG tablet Take 1 tablet (40 mg total) by mouth daily. 12/13/23   Singh, Prashant K, MD  thiamine  (VITAMIN B-1) 100  MG tablet Take 1 tablet (100 mg total) by mouth daily. 11/14/23   Ghimire, Donalda HERO, MD  umeclidinium bromide  (INCRUSE ELLIPTA ) 62.5 MCG/ACT AEPB Inhale 1 puff into the lungs daily. 11/14/23   Ghimire, Donalda HERO, MD  zonisamide  (ZONEGRAN ) 100 MG capsule Take 2 capsules (200 mg total) by mouth daily. 10/31/23   Samtani, Jai-Gurmukh, MD    Allergies: Iron  dextran, Aspirin , and Penicillins    Review of Systems  Updated Vital Signs Pulse 96   Temp 98.2 F (36.8 C) (Oral)   Ht 5' 2 (1.575 m)   Wt 58.1 kg   LMP 01/08/2011   SpO2 100%   BMI 23.41 kg/m   Physical Exam  (all labs ordered are listed, but only abnormal results are displayed) Labs Reviewed - No data to display  EKG: None  Radiology: No results found.  {Document cardiac monitor, telemetry assessment procedure when appropriate:32947} Procedures   Medications Ordered in the ED - No data to display    {Click here for ABCD2, HEART and other calculators REFRESH Note before signing:1}                              Medical Decision Making Risk Prescription drug management.   ***  {Document critical care time when appropriate  Document review of labs and clinical decision tools ie CHADS2VASC2, etc  Document your independent review of radiology images and any outside records  Document your discussion with family members, caretakers and with consultants  Document social determinants of health affecting pt's care  Document your decision making why or why not admission, treatments were needed:32947:::1}   Final diagnoses:  None    ED Discharge Orders     None

## 2024-01-23 NOTE — ED Notes (Signed)
 Patient transported to CT

## 2024-01-24 ENCOUNTER — Observation Stay (HOSPITAL_COMMUNITY)

## 2024-01-24 ENCOUNTER — Encounter (HOSPITAL_COMMUNITY): Payer: Self-pay | Admitting: Internal Medicine

## 2024-01-24 DIAGNOSIS — F1910 Other psychoactive substance abuse, uncomplicated: Secondary | ICD-10-CM | POA: Insufficient documentation

## 2024-01-24 DIAGNOSIS — E872 Acidosis, unspecified: Secondary | ICD-10-CM | POA: Insufficient documentation

## 2024-01-24 DIAGNOSIS — F1020 Alcohol dependence, uncomplicated: Secondary | ICD-10-CM | POA: Diagnosis not present

## 2024-01-24 DIAGNOSIS — E1142 Type 2 diabetes mellitus with diabetic polyneuropathy: Secondary | ICD-10-CM | POA: Diagnosis not present

## 2024-01-24 DIAGNOSIS — R569 Unspecified convulsions: Secondary | ICD-10-CM

## 2024-01-24 DIAGNOSIS — Z794 Long term (current) use of insulin: Secondary | ICD-10-CM

## 2024-01-24 LAB — HEPATIC FUNCTION PANEL
ALT: 19 U/L (ref 0–44)
AST: 30 U/L (ref 15–41)
Albumin: 3.5 g/dL (ref 3.5–5.0)
Alkaline Phosphatase: 87 U/L (ref 38–126)
Bilirubin, Direct: 0.3 mg/dL — ABNORMAL HIGH (ref 0.0–0.2)
Indirect Bilirubin: 1 mg/dL — ABNORMAL HIGH (ref 0.3–0.9)
Total Bilirubin: 1.3 mg/dL — ABNORMAL HIGH (ref 0.0–1.2)
Total Protein: 7.2 g/dL (ref 6.5–8.1)

## 2024-01-24 LAB — BASIC METABOLIC PANEL WITH GFR
Anion gap: 12 (ref 5–15)
BUN: 5 mg/dL — ABNORMAL LOW (ref 6–20)
CO2: 19 mmol/L — ABNORMAL LOW (ref 22–32)
Calcium: 8.9 mg/dL (ref 8.9–10.3)
Chloride: 104 mmol/L (ref 98–111)
Creatinine, Ser: 0.64 mg/dL (ref 0.44–1.00)
GFR, Estimated: >60 mL/min (ref >60)
Glucose, Bld: 151 mg/dL — ABNORMAL HIGH (ref 70–99)
Potassium: 4.2 mmol/L (ref 3.5–5.1)
Sodium: 135 mmol/L (ref 135–145)

## 2024-01-24 LAB — CBG MONITORING, ED: Glucose-Capillary: 406 mg/dL — ABNORMAL HIGH (ref 70–99)

## 2024-01-24 LAB — CBC WITH DIFFERENTIAL/PLATELET
Abs Immature Granulocytes: 0.02 K/uL (ref 0.00–0.07)
Basophils Absolute: 0 K/uL (ref 0.0–0.1)
Basophils Relative: 0 %
Eosinophils Absolute: 0 K/uL (ref 0.0–0.5)
Eosinophils Relative: 0 %
HCT: 34.6 % — ABNORMAL LOW (ref 36.0–46.0)
Hemoglobin: 10.1 g/dL — ABNORMAL LOW (ref 12.0–15.0)
Immature Granulocytes: 0 %
Lymphocytes Relative: 8 %
Lymphs Abs: 0.6 K/uL — ABNORMAL LOW (ref 0.7–4.0)
MCH: 21.9 pg — ABNORMAL LOW (ref 26.0–34.0)
MCHC: 29.2 g/dL — ABNORMAL LOW (ref 30.0–36.0)
MCV: 74.9 fL — ABNORMAL LOW (ref 80.0–100.0)
Monocytes Absolute: 0.5 K/uL (ref 0.1–1.0)
Monocytes Relative: 6 %
Neutro Abs: 6 K/uL (ref 1.7–7.7)
Neutrophils Relative %: 86 %
Platelets: 231 K/uL (ref 150–400)
RBC: 4.62 MIL/uL (ref 3.87–5.11)
RDW: 20.4 % — ABNORMAL HIGH (ref 11.5–15.5)
WBC: 7 K/uL (ref 4.0–10.5)
nRBC: 0 % (ref 0.0–0.2)

## 2024-01-24 LAB — GLUCOSE, CAPILLARY
Glucose-Capillary: 346 mg/dL — ABNORMAL HIGH (ref 70–99)
Glucose-Capillary: 159 mg/dL — ABNORMAL HIGH (ref 70–99)

## 2024-01-24 LAB — I-STAT CG4 LACTIC ACID, ED: Lactic Acid, Venous: 5.8 mmol/L (ref 0.5–1.9)

## 2024-01-24 LAB — RAPID URINE DRUG SCREEN, HOSP PERFORMED
Amphetamines: NOT DETECTED
Barbiturates: NOT DETECTED
Benzodiazepines: POSITIVE — AB
Cocaine: NOT DETECTED
Opiates: NOT DETECTED
Tetrahydrocannabinol: NOT DETECTED

## 2024-01-24 LAB — MRSA NEXT GEN BY PCR, NASAL: MRSA by PCR Next Gen: NOT DETECTED

## 2024-01-24 MED ORDER — LEVETIRACETAM (KEPPRA) 500 MG/5 ML ADULT IV PUSH
500.0000 mg | Freq: Two times a day (BID) | INTRAVENOUS | Status: DC
  Administered 2024-01-24: 500 mg via INTRAVENOUS
  Filled 2024-01-24 (×2): qty 5

## 2024-01-24 MED ORDER — THIAMINE MONONITRATE 100 MG PO TABS: 100.0000 mg | ORAL_TABLET | Freq: Every day | ORAL | Status: DC

## 2024-01-24 MED ORDER — THIAMINE HCL 100 MG/ML IJ SOLN
100.0000 mg | Freq: Every day | INTRAMUSCULAR | Status: DC
  Administered 2024-01-24: 100 mg via INTRAVENOUS
  Filled 2024-01-24: qty 2

## 2024-01-24 MED ORDER — LORAZEPAM 2 MG/ML IJ SOLN: 0.0000 mg | Freq: Three times a day (TID) | INTRAMUSCULAR | Status: DC

## 2024-01-24 MED ORDER — LORAZEPAM 2 MG/ML IJ SOLN: 2.0000 mg | Freq: Once | INTRAMUSCULAR | Status: AC

## 2024-01-24 MED ORDER — ADULT MULTIVITAMIN W/MINERALS CH
1.0000 | ORAL_TABLET | Freq: Every day | ORAL | Status: DC
  Administered 2024-01-24: 1 via ORAL
  Filled 2024-01-24: qty 1

## 2024-01-24 MED ORDER — IPRATROPIUM-ALBUTEROL 0.5-2.5 (3) MG/3ML IN SOLN: 3.0000 mL | RESPIRATORY_TRACT | Status: DC | PRN

## 2024-01-24 MED ORDER — LORAZEPAM 2 MG/ML IJ SOLN
0.0000 mg | INTRAMUSCULAR | Status: DC
  Administered 2024-01-24: 1 mg via INTRAVENOUS

## 2024-01-24 MED ORDER — LEVETIRACETAM (KEPPRA) 500 MG/5 ML ADULT IV PUSH
1500.0000 mg | Freq: Once | INTRAVENOUS | Status: AC
  Administered 2024-01-24: 1500 mg via INTRAVENOUS
  Filled 2024-01-24: qty 15

## 2024-01-24 MED ORDER — ENOXAPARIN SODIUM 40 MG/0.4ML IJ SOSY
40.0000 mg | PREFILLED_SYRINGE | INTRAMUSCULAR | Status: DC
  Administered 2024-01-24: 40 mg via SUBCUTANEOUS
  Filled 2024-01-24: qty 0.4

## 2024-01-24 MED ORDER — FOLIC ACID 1 MG PO TABS
1.0000 mg | ORAL_TABLET | Freq: Every day | ORAL | Status: DC
  Administered 2024-01-24: 1 mg via ORAL
  Filled 2024-01-24: qty 1

## 2024-01-24 MED ORDER — ZONISAMIDE 100 MG PO CAPS
200.0000 mg | ORAL_CAPSULE | Freq: Every day | ORAL | Status: DC
  Administered 2024-01-24: 200 mg via ORAL
  Filled 2024-01-24: qty 2

## 2024-01-24 MED ORDER — IPRATROPIUM-ALBUTEROL 0.5-2.5 (3) MG/3ML IN SOLN: 3.0000 mL | Freq: Two times a day (BID) | RESPIRATORY_TRACT | Status: DC

## 2024-01-24 MED ORDER — INSULIN ASPART 100 UNIT/ML IJ SOLN
0.0000 [IU] | INTRAMUSCULAR | Status: DC
  Administered 2024-01-24: 7 [IU] via SUBCUTANEOUS
  Administered 2024-01-24: 2 [IU] via SUBCUTANEOUS

## 2024-01-24 MED ORDER — LORAZEPAM 2 MG/ML IJ SOLN
1.0000 mg | INTRAMUSCULAR | Status: DC | PRN
  Filled 2024-01-24: qty 1

## 2024-01-24 MED ORDER — SODIUM CHLORIDE 0.9 % IV SOLN: INTRAVENOUS | Status: DC

## 2024-01-24 MED ORDER — IPRATROPIUM-ALBUTEROL 0.5-2.5 (3) MG/3ML IN SOLN
3.0000 mL | RESPIRATORY_TRACT | Status: DC
  Administered 2024-01-24: 3 mL via RESPIRATORY_TRACT
  Filled 2024-01-24: qty 3

## 2024-01-24 MED ORDER — LORAZEPAM 1 MG PO TABS: 1.0000 mg | ORAL_TABLET | ORAL | Status: DC | PRN

## 2024-01-24 MED ORDER — BUDESONIDE 0.25 MG/2ML IN SUSP
0.2500 mg | Freq: Two times a day (BID) | RESPIRATORY_TRACT | Status: DC
  Administered 2024-01-24: 0.25 mg via RESPIRATORY_TRACT
  Filled 2024-01-24: qty 2

## 2024-01-24 MED ORDER — DIAZEPAM 5 MG/ML IJ SOLN
2.5000 mg | Freq: Once | INTRAMUSCULAR | Status: AC
  Administered 2024-01-24: 2.5 mg via INTRAVENOUS
  Filled 2024-01-24: qty 2

## 2024-01-24 MED ORDER — LORAZEPAM 2 MG/ML IJ SOLN
INTRAMUSCULAR | Status: AC
  Administered 2024-01-24: 2 mg via INTRAVENOUS
  Filled 2024-01-24: qty 1

## 2024-01-24 MED ORDER — THIAMINE HCL 100 MG/ML IJ SOLN
500.0000 mg | Freq: Once | INTRAVENOUS | Status: AC
  Administered 2024-01-24: 500 mg via INTRAVENOUS
  Filled 2024-01-24: qty 500
  Filled 2024-01-24: qty 5

## 2024-01-24 NOTE — Progress Notes (Addendum)
 Patient left AMA. AMA formed signed, given to charge RN. Risks explained. MD aware

## 2024-01-24 NOTE — Progress Notes (Signed)
 Patient back to baseline LOC.

## 2024-01-24 NOTE — Plan of Care (Signed)
  Problem: Metabolic: Goal: Ability to maintain appropriate glucose levels will improve Outcome: Progressing   Problem: Coping: Goal: Level of anxiety will decrease Outcome: Progressing

## 2024-01-24 NOTE — ED Notes (Signed)
 EDP Schlossman, MD made aware of patient's increased heart rate. Dreama, MD stated to go ahead and give patient the dounebs.

## 2024-01-24 NOTE — ED Notes (Addendum)
 Attending Franky, MD made round on patient. Alerted this nurse patient was seizing. Verbally ordered this nurse for 2mg  ativan .

## 2024-01-24 NOTE — Discharge Summary (Signed)
 [facility] Discharge Summary  [name] FMW:[id number] DOB: 1966/01/01 DOA: 01/23/2024  PCP: Patient, No Pcp Per  Admit date: 01/23/2024 Discharge date: 01/24/2024  Admitted From: home Disposition:  home, left AMA  Recommendations for Outpatient Follow-up:  Follow up with PCP ASAP  Discharge Condition: guarded CODE STATUS: Full code Diet Orders (From admission, onward)     Start     Ordered   01/24/24 0816  Diet regular Fluid consistency: Thin  Diet effective now       Question:  Fluid consistency:  Answer:  Thin   01/24/24 0816            HPI: Per admitting MD, [name] is a 58 y.o. female with COPD alcohol  abuse, seizures, depression, diabetes mellitus type 2 presents to the ER with complaints of having wheezing over the last few days.  Patient states that when she woke up this morning she felt short of breath but soon she fell onto the floor and thinks she may have had a seizure.  Patient states that she missed her Keppra  and zonisamide  last few days.  As she ran out of it.   Two months ago patient was admitted for sepsis and respiratory failure required intubation.   ED Course: In the ER patient was found to be wheezing had CT chest abdomen pelvis done which did not show anything acute.  Labs show lactic acidosis.  Troponins and BNP unremarkable.  Hemoglobin 10.3.  Patient was given fluids for the lactic acidosis.  While in the ER at the time of my exam patient went into a generalized tonic-clonic seizure lasting for about 1 minute and became postictal.  I ordered 2 mg IV Ativan  stat and discussed with neurologist Dr. Michaela who advised loading with Keppra  1500 mg and continue home dose.  Patient admitted for breakthrough seizures with COPD exacerbation.  CT of the pelvis lumbar spine and head did not show any acute.  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  She woke up this morning on the floor, thinks might have had a seizure.  She also felt short of breath.  She came to the hospital, and while being admitted and discussing with the admitting MD she had another generalized seizure.  She admits to noncompliance with home Keppra  and has not taken the doses for the last few days.  She tells me she drinks every day about 2 x 40 ounce cans of beer.  After this morning seizure she was postictal, but eventually woke up and was back to baseline, alert and oriented x 4, asking for food.  She has some tremors likely due to chronic alcohol  use as well as withdrawing.  She was placed on CIWA.  Unfortunately, patient xpresses desire to leave the Hospital immidiately, patient has been warned that this is not Medically advisable at this time, and can result in Medical complications like Death and Disability, patient understands and accepts the risks involved and assumes full  responsibilty of this decision.  Sepsis ruled out  Discharge Instructions   Allergies as of 01/24/2024       Reactions   Iron  Dextran Shortness Of Breath, Anxiety   Aspirin  Nausea And Vomiting, Other (See Comments)   Ok to take tylenol  or ibuprofen    Penicillins Other (See Comments)   Unknown reaction from childhood: family would like this to remain as an allergy. Tolerated cephalosporins and zosyn      Med Rec must be completed prior to using this SMARTLINK   Consultations: None  Procedures/Studies:  CT PELVIS WO CONTRAST Result Date:  01/24/2024 CLINICAL DATA:  58 year old female with seizure in the emergency department, preceding fall. EXAM: CT PELVIS WITHOUT CONTRAST TECHNIQUE: Multidetector CT imaging of the pelvis was performed following the standard protocol without intravenous contrast. RADIATION DOSE REDUCTION: This exam was performed according to the departmental dose-optimization program which includes automated exposure control, adjustment of the mA and/or kV according to patient size and/or use of iterative reconstruction technique. COMPARISON:  Lumbar spine CT today. CT Abdomen and Pelvis last night. FINDINGS: Urinary Tract: Kidneys not included. No hydroureter. Moderate urinary bladder distention with excreted IV contrast. Bowel: Stable. Right abdominal anastomosis redemonstrated. No pneumoperitoneum or free fluid identified. Vascular/Lymphatic: Aortoiliac calcified atherosclerosis. Vascular patency is not evaluated in the absence of IV contrast. No lymphadenopathy identified. Reproductive: Absent uterus. Noncontrast left ovary within normal limits. Right ovary diminutive or absent. Other:  No pelvis free fluid. Musculoskeletal: Lumbar spine reported separately this morning. Sacrum and SI joints appear stable and intact with asymmetric sclerotic degeneration on the right. Right total hip arthroplasty redemonstrated, stable. Bilateral pelvis and proximal femurs appear intact. Underlying osteopenia. No acute osseous abnormality identified. No acute superficial soft tissue injury identified. IMPRESSION: 1. Stable from CT Abdomen and Pelvis yesterday. No acute traumatic injury identified in the Pelvis. 2. Lumbar spine reported separately. Electronically Signed   By: VEAR Hurst M.D.   On: 01/24/2024 04:23   CT LUMBAR SPINE WO CONTRAST Result Date: 01/24/2024 CLINICAL DATA:  58 year old female with seizure in the emergency department, preceding fall. EXAM: CT LUMBAR SPINE WITHOUT CONTRAST TECHNIQUE: Multidetector CT imaging of the lumbar  spine was performed without intravenous contrast administration. Multiplanar CT image reconstructions were also generated. RADIATION DOSE REDUCTION: This exam was performed according to the departmental dose-optimization program which includes automated exposure control, adjustment of the mA and/or kV according to patient size and/or use of iterative reconstruction technique. COMPARISON:  CT Abdomen and Pelvis last night. FINDINGS: Segmentation: Normal. Alignment: Stable and normal lumbar lordosis. Vertebrae: Osteopenia. Stable, maintained lumbar vertebral height. Chronic right posterior 11th and 12th rib fractures are stable. Lumbar vertebrae appear intact. Visible sacrum and SI joints appear intact, asymmetric right SI joint degeneration. Paraspinal and other soft tissues: Stable from last night. Calcified aortic atherosclerosis. Negative lumbar paraspinal soft tissues. Disc levels: Lumbar spine degeneration primarily consisting of facet arthropathy, moderate to severe at L4-L5 and L5-S1. No evidence of lumbar disc herniation or spinal stenosis. IMPRESSION: 1. No acute traumatic injury identified in the Lumbar Spine. 2. Stable from CT Abdomen and Pelvis last night. 3.  Aortic Atherosclerosis (ICD10-I70.0). Electronically Signed   By: VEAR Hurst M.D.   On: 01/24/2024 04:20   CT HEAD WO CONTRAST ( ) Result Date: 01/24/2024 CLINICAL DATA:  58 year old female with seizure in the emergency department, preceding fall. EXAM: CT HEAD WITHOUT CONTRAST TECHNIQUE: Contiguous axial images were obtained from the base of the skull through the vertex without intravenous contrast. RADIATION DOSE REDUCTION:  This exam was performed according to the departmental dose-optimization program which includes automated exposure control, adjustment of the mA and/or kV according to patient size and/or use of iterative reconstruction technique. COMPARISON:  Brain MRI 08/24/2022.  Head CT 10/29/2023. FINDINGS: Brain: Cerebral volume remains  normal for age. No midline shift, ventriculomegaly, mass effect, evidence of mass lesion, intracranial hemorrhage or evidence of cortically based acute infarction. Gray-white matter differentiation is within normal limits throughout the brain. Vascular: No suspicious intracranial vascular hyperdensity. Calcified atherosclerosis at the skull base. Skull: Stable and intact. Sinuses/Orbits: Visualized paranasal sinuses and mastoids are stable and well aerated. Other: No acute orbit or scalp soft tissue injury identified. IMPRESSION: No acute traumatic injury identified. Stable and normal for age noncontrast CT appearance of the brain. Electronically Signed   By: VEAR Hurst M.D.   On: 01/24/2024 04:16   CT ABDOMEN PELVIS W CONTRAST Result Date: 01/23/2024 EXAM: CTA CHEST PE WITHOUT AND WITH CONTRAST CT ABDOMEN AND PELVIS WITHOUT AND WITH CONTRAST 01/23/2024 08:39:45 PM TECHNIQUE: CTA of the chest was performed after the administration of intravenous contrast. Multiplanar reformatted images are provided for review. MIP images are provided for review. CT of the abdomen and pelvis was performed with the administration of intravenous contrast. Automated exposure control, iterative reconstruction, and/or weight based adjustment of the mA/kV was utilized to reduce the radiation dose to as low as reasonably achievable. COMPARISON: Same-day chest radiograph. CTA chest December 02, 2023. CT abdomen and pelvis December 17, 2023. CLINICAL HISTORY: Pulmonary embolism (PE) suspected, high prob. Nausea, vomiting, non-localized abdominal pain. FINDINGS: CHEST: PULMONARY ARTERIES: Pulmonary arteries are adequately opacified for evaluation. No intraluminal filling defect to suggest pulmonary embolism. Dilated main pulmonary artery measuring 33 mm which can be seen with pulmonary hypertension. MEDIASTINUM: No mediastinal lymphadenopathy. The heart and pericardium demonstrate no acute abnormality. Normal caliber thoracic aorta without dissection.  Aortic and coronary artery atherosclerotic calcifications. LUNGS AND PLEURA: Emphysema. No focal consolidation, pleural effusion, or pneumothorax. SOFT TISSUES AND BONES: Chronic compression fractures of T1, T4, T6, and T9. No acute fracture. ABDOMEN AND PELVIS: LIVER: Hepatic steatosis. GALLBLADDER AND BILE DUCTS: Gallbladder is unremarkable. No biliary ductal dilatation. SPLEEN: Spleen demonstrates no acute abnormality. PANCREAS: Pancreas demonstrates no acute abnormality. ADRENAL GLANDS: Adrenal glands demonstrate no acute abnormality. KIDNEYS, URETERS AND BLADDER: No stones in the kidneys or ureters. No hydronephrosis. No perinephric or periureteral stranding. Urinary bladder is partially obscured by streak artifact. GI AND BOWEL: Postoperative change of gastric bypass. Diffuse esophageal wall thickening. Correlate for esophagitis. There is no bowel obstruction. No abnormal bowel wall thickening or distension. REPRODUCTIVE: Reproductive organs are unremarkable. PERITONEUM AND RETROPERITONEUM: No ascites or free air. LYMPH NODES: No lymphadenopathy. BONES AND SOFT TISSUES: Right hip arthroplasty. No focal soft tissue abnormality. IMPRESSION: 1. No pulmonary embolism. 2. Dilated main pulmonary artery measuring 33 mm, consistent with pulmonary hypertension. 3. Emphysema. 4. Diffuse esophageal wall thickening, correlate for esophagitis. 5. Hepatic steatosis. Electronically signed by: Norman Gatlin MD 01/23/2024 09:04 PM EDT RP Workstation: [id number]   CT Angio Chest PE W and/or Wo Contrast Result Date: 01/23/2024 EXAM: CTA CHEST PE WITHOUT AND WITH CONTRAST CT ABDOMEN AND PELVIS WITHOUT AND WITH CONTRAST 01/23/2024 08:39:45 PM TECHNIQUE: CTA of the chest was performed after the administration of intravenous contrast. Multiplanar reformatted images are provided for review. MIP images are provided for review. CT of the abdomen and pelvis was performed with the administration of intravenous contrast. Automated  exposure control, iterative reconstruction, and/or weight based adjustment of the  mA/kV was utilized to reduce the radiation dose to as low as reasonably achievable. COMPARISON: Same-day chest radiograph. CTA chest December 02, 2023. CT abdomen and pelvis December 17, 2023. CLINICAL HISTORY: Pulmonary embolism (PE) suspected, high prob. Nausea, vomiting, non-localized abdominal pain. FINDINGS: CHEST: PULMONARY ARTERIES: Pulmonary arteries are adequately opacified for evaluation. No intraluminal filling defect to suggest pulmonary embolism. Dilated main pulmonary artery measuring 33 mm which can be seen with pulmonary hypertension. MEDIASTINUM: No mediastinal lymphadenopathy. The heart and pericardium demonstrate no acute abnormality. Normal caliber thoracic aorta without dissection. Aortic and coronary artery atherosclerotic calcifications. LUNGS AND PLEURA: Emphysema. No focal consolidation, pleural effusion, or pneumothorax. SOFT TISSUES AND BONES: Chronic compression fractures of T1, T4, T6, and T9. No acute fracture. ABDOMEN AND PELVIS: LIVER: Hepatic steatosis. GALLBLADDER AND BILE DUCTS: Gallbladder is unremarkable. No biliary ductal dilatation. SPLEEN: Spleen demonstrates no acute abnormality. PANCREAS: Pancreas demonstrates no acute abnormality. ADRENAL GLANDS: Adrenal glands demonstrate no acute abnormality. KIDNEYS, URETERS AND BLADDER: No stones in the kidneys or ureters. No hydronephrosis. No perinephric or periureteral stranding. Urinary bladder is partially obscured by streak artifact. GI AND BOWEL: Postoperative change of gastric bypass. Diffuse esophageal wall thickening. Correlate for esophagitis. There is no bowel obstruction. No abnormal bowel wall thickening or distension. REPRODUCTIVE: Reproductive organs are unremarkable. PERITONEUM AND RETROPERITONEUM: No ascites or free air. LYMPH NODES: No lymphadenopathy. BONES AND SOFT TISSUES: Right hip arthroplasty. No focal soft tissue abnormality. IMPRESSION:  1. No pulmonary embolism. 2. Dilated main pulmonary artery measuring 33 mm, consistent with pulmonary hypertension. 3. Emphysema. 4. Diffuse esophageal wall thickening, correlate for esophagitis. 5. Hepatic steatosis. Electronically signed by: Norman Gatlin MD 01/23/2024 09:04 PM EDT RP Workstation: [id number]   DG Chest Portable 1 View Result Date: 01/23/2024 EXAM: 1 VIEW XRAY OF THE CHEST 01/23/2024 08:02:37 PM COMPARISON: Radiograph 3125. CLINICAL HISTORY: SOB. C/O dyspnea with mild wheezing. FINDINGS: LUNGS AND PLEURA: Left basilar atelectasis. Otherwise no focal pulmonary opacity. No pulmonary edema. No pleural effusion. No pneumothorax. HEART AND MEDIASTINUM: No acute abnormality of the cardiac and mediastinal silhouettes. BONES AND SOFT TISSUES: No acute osseous abnormality. IMPRESSION: 1. No acute process. Electronically signed by: Norman Gatlin MD 01/23/2024 08:10 PM EDT RP Workstation: [id number]   DG Chest 2 View Result Date: 01/21/2024 CLINICAL DATA:  Shortness of breath. EXAM: CHEST - 2 VIEW COMPARISON:  01/01/2024. FINDINGS: There is faint opacity overlying the right mid lung zone, which is of indeterminate origin. This is not well seen on the lateral film. This may be external, soft tissue calcification or due to focal lung parenchymal opacity. Correlate clinically. Bilateral lung fields are otherwise clear. Bilateral costophrenic angles are clear. Normal cardio-mediastinal silhouette. No acute osseous abnormalities. The soft tissues are within normal limits. IMPRESSION: Faint opacity overlying the right mid lung zone, as described above. Otherwise no acute cardiopulmonary abnormality. Electronically Signed   By: Ree Molt M.D.   On: 01/21/2024 14:55   DG Chest 2 View Result Date: 01/01/2024 CLINICAL DATA:  Wheezing. EXAM: CHEST - 2 VIEW COMPARISON:  12/18/2023 FINDINGS: The lungs are clear without focal pneumonia, edema, pneumothorax or substantial pleural effusion. The  cardiopericardial silhouette is within normal limits for size. No acute bony abnormality. Telemetry leads overlie the chest. IMPRESSION: No active cardiopulmonary disease. Electronically Signed   By: Camellia Candle M.D.   On: 01/01/2024 07:56     The results of significant diagnostics from this hospitalization (including imaging, microbiology, ancillary and laboratory) are listed below for reference.     Microbiology:  Recent Results (from the past 240 hours)  Resp panel by RT-PCR (RSV, Flu A&B, Covid) Anterior Nasal Swab     Status: None   Collection Time: 01/23/24  7:16 PM   Specimen: Anterior Nasal Swab  Result Value Ref Range Status   SARS Coronavirus 2 by RT PCR NEGATIVE NEGATIVE Final   Influenza A by PCR NEGATIVE NEGATIVE Final   Influenza B by PCR NEGATIVE NEGATIVE Final    Comment: (NOTE) The Xpert Xpress SARS-CoV-2/FLU/RSV plus assay is intended as an aid in the diagnosis of influenza from Nasopharyngeal swab specimens and should not be used as a sole basis for treatment. Nasal washings and aspirates are unacceptable for Xpert Xpress SARS-CoV-2/FLU/RSV testing.  Fact Sheet for Patients: BloggerCourse.com  Fact Sheet for Healthcare Providers: SeriousBroker.it  This test is not yet approved or cleared by the [place]  FDA and has been authorized for detection and/or diagnosis of SARS-CoV-2 by FDA under an Emergency Use Authorization (EUA). This EUA will remain in effect (meaning this test can be used) for the duration of the COVID-19 declaration under Section 564(b)(1) of the Act, 21 U.S.C. section 360bbb-3(b)(1), unless the authorization is terminated or revoked.     Resp Syncytial Virus by PCR NEGATIVE NEGATIVE Final    Comment: (NOTE) Fact Sheet for Patients: BloggerCourse.com  Fact Sheet for Healthcare Providers: SeriousBroker.it  This test is not yet approved  or cleared by the [place]  FDA and has been authorized for detection and/or diagnosis of SARS-CoV-2 by FDA under an Emergency Use Authorization (EUA). This EUA will remain in effect (meaning this test can be used) for the duration of the COVID-19 declaration under Section 564(b)(1) of the Act, 21 U.S.C. section 360bbb-3(b)(1), unless the authorization is terminated or revoked.  Performed at [facility], [address] [place], [place] [address]   MRSA Next Gen by PCR, Nasal     Status: None   Collection Time: 01/24/24  5:04 AM   Specimen: Nasal Mucosa; Nasal Swab  Result Value Ref Range Status   MRSA by PCR Next Gen NOT DETECTED NOT DETECTED Final    Comment: (NOTE) The GeneXpert MRSA Assay (FDA approved for NASAL specimens only), is one component of a comprehensive MRSA colonization surveillance program. It is not intended to diagnose MRSA infection nor to guide or monitor treatment for MRSA infections. Test performance is not FDA approved in patients less than 35 years old. Performed at [facility], [address] [place], [place] [address]      Labs: Basic Metabolic Panel: Recent Labs  Lab 01/23/24 1900 01/23/24 2315 01/23/24 2324 01/24/24 0823  NA 138 134* 135 135  K 3.6 3.5 3.6 4.2  CL 102 101  --  104  CO2 20* 19*  --  19*  GLUCOSE 126* 270*  --  151*  BUN 9 7  --  5*  CREATININE 0.58 0.74  --  0.64  CALCIUM  8.6* 8.4*  --  8.9   Liver Function Tests: Recent Labs  Lab 01/23/24 1900 01/23/24 2315 01/24/24 0823  AST 28 32 30  ALT 18 18 19   ALKPHOS 88 86 87  BILITOT 0.9 0.5 1.3*  PROT 6.8 6.6 7.2  ALBUMIN  3.5 3.4* 3.5   CBC: Recent Labs  Lab 01/23/24 1900 01/23/24 2324 01/24/24 0823  WBC 5.6  --  7.0  NEUTROABS 3.2  --  6.0  HGB 10.3* 11.6* 10.1*  HCT 34.3* 34.0* 34.6*  MCV 73.3*  --  74.9*  PLT 236  --  231   CBG: Recent Labs  Lab 01/24/24 0325 01/24/24 0511 01/24/24 1122  GLUCAP 406* 346* 159*   Hgb A1c No results  for input(s): HGBA1C in the last 72 hours. Lipid Profile No results for input(s): CHOL, HDL, LDLCALC, TRIG, CHOLHDL, LDLDIRECT in the last 72 hours. Thyroid  function studies No results for input(s): TSH, T4TOTAL, T3FREE, THYROIDAB in the last 72 hours.  Invalid input(s): FREET3 Urinalysis    Component Value Date/Time   COLORURINE STRAW (A) 01/23/2024 2003   APPEARANCEUR CLEAR 01/23/2024 2003   LABSPEC 1.002 (L) 01/23/2024 2003   PHURINE 5.0 01/23/2024 2003   GLUCOSEU NEGATIVE 01/23/2024 2003   HGBUR NEGATIVE 01/23/2024 2003   BILIRUBINUR NEGATIVE 01/23/2024 2003   KETONESUR NEGATIVE 01/23/2024 2003   PROTEINUR NEGATIVE 01/23/2024 2003   UROBILINOGEN 0.2 09/04/2013 0426   NITRITE NEGATIVE 01/23/2024 2003   LEUKOCYTESUR NEGATIVE 01/23/2024 2003    FURTHER DISCHARGE INSTRUCTIONS:   Get Medicines reviewed and adjusted: Please take all your medications with you for your next visit with your Primary MD   Laboratory/radiological data: Please request your Primary MD to go over all hospital tests and procedure/radiological results at the follow up, please ask your Primary MD to get all Hospital records sent to his/her office.   In some cases, they will be blood work, cultures and biopsy results pending at the time of your discharge. Please request that your primary care M.D. goes through all the records of your hospital data and follows up on these results.   Also Note the following: If you experience worsening of your admission symptoms, develop shortness of breath, life threatening emergency, suicidal or homicidal thoughts you must seek medical attention immediately by calling 911 or calling your MD immediately  if symptoms less severe.   You must read complete instructions/literature along with all the possible adverse reactions/side effects for all the Medicines you take and that have been prescribed to you. Take any new Medicines after you have completely  understood and accpet all the possible adverse reactions/side effects.    Do not drive when taking Pain medications or sleeping medications (Benzodaizepines)   Do not take more than prescribed Pain, Sleep and Anxiety Medications. It is not advisable to combine anxiety,sleep and pain medications without talking with your primary care practitioner   Special Instructions: If you have smoked or chewed Tobacco  in the last 2 yrs please stop smoking, stop any regular Alcohol   and or any Recreational drug use.   Wear Seat belts while driving.   Please note: You were cared for by a hospitalist during your hospital stay. Once you are discharged, your primary care physician will handle any further medical issues. Please note that NO REFILLS for any discharge medications will be authorized once you are discharged, as it is imperative that you return to your primary care physician (or establish a relationship with a primary care physician if you do not have one) for your post hospital discharge needs so that they can reassess your need for medications and monitor your lab values.  Time coordinating discharge: 15 minutes  SIGNED:  Nilda Fendt, MD, PhD 01/24/2024, 12:31 PM

## 2024-01-24 NOTE — Progress Notes (Signed)
 CIWA 0440: Patient postictal on arrival to floor, responds to loud voice and painful stimulus. CIWA scored based on patient's brief wakeful moments during initial acceptance to floor.   She did wake for 1-2 minutes at a time for which periods patient was agitated, restless and had visual tremor. She could not answer questions at this time.

## 2024-01-24 NOTE — Progress Notes (Signed)
 No charge note  Patient seen and examined this morning, admitted overnight, H&P reviewed and I agree with the assessment and plan  In brief, 58 year old female with COPD, ongoing alcohol  use, seizures, noncompliant with home medications, DM2 comes into the hospital with possible seizure at home, was also complaining of wheezing for the last few days.  She woke up this morning on the floor, thinks might have had a seizure.  She also felt short of breath.  She came to the hospital, and while being admitted and discussing with the admitting MD she had another generalized seizure.  She admits to noncompliance with home Keppra  and has not taken the doses for the last few days.  She tells me she drinks every day about 2 x 40 ounce cans of beer  She is slowly waking up this morning, alert and oriented x 4, asking for food.  Has some tremors likely due to chronic EtOH use.  Continue Keppra , CIWA, given most recent seizure couple hours ago would like 24 hours in the inpatient setting and potentially discharge home tomorrow if she has no further seizures  Scheduled Meds:  budesonide  (PULMICORT ) nebulizer solution  0.25 mg Nebulization BID   enoxaparin  (LOVENOX ) injection  40 mg Subcutaneous Q24H   folic acid   1 mg Oral Daily   insulin  aspart  0-9 Units Subcutaneous Q4H   ipratropium-albuterol   3 mL Nebulization Q4H   levETIRAcetam   500 mg Intravenous Q12H   LORazepam   0-4 mg Intravenous Q4H   Followed by   NOREEN ON 01/26/2024] LORazepam   0-4 mg Intravenous Q8H   multivitamin with minerals  1 tablet Oral Daily   nicotine   21 mg Transdermal Once   thiamine   100 mg Oral Daily   Or   thiamine   100 mg Intravenous Daily   zonisamide   200 mg Oral Daily   Continuous Infusions:  sodium chloride  125 mL/hr at 01/24/24 0803   thiamine  (VITAMIN B1) injection 500 mg (01/24/24 0812)   PRN Meds:.ipratropium-albuterol , LORazepam  **OR** LORazepam   Rhema Boyett M. Trixie, MD, PhD [facility]  Between 7 am - 7  pm you can contact me via Amion (for emergencies) or Securechat (non urgent matters).  I am not available 7 pm - 7 am, please contact night coverage MD/APP via Amion

## 2024-01-24 NOTE — ED Notes (Addendum)
 Patient is currently alert to painful stimuli. Presenting with oral trauma. Patient's mouth suctioned, no active bleeding.

## 2024-01-24 NOTE — ED Notes (Signed)
 This nurse took and returned with patient from CT.

## 2024-01-24 NOTE — ED Notes (Signed)
 ED TO INPATIENT HANDOFF REPORT  ED Nurse Name and Phone #: Janeth RN [phone]  S Name/Age/Gender [name] 58 y.o. female Room/Bed: 021C/021C  Code Status   Code Status: Prior  Home/SNF/Other Home Patient currently postictal and A/Ox0, is normally A/Ox4 Is this baseline? No   Triage Complete: Triage complete  Chief Complaint Seizure [facility]) [R56.9]  Triage Note Pt arrived with EMS c/o dyspnea with mild wheezing EMS treated with 5ml albuterol . Resolved weezing.    Allergies Allergies  Allergen Reactions   Iron  Dextran Shortness Of Breath and Anxiety   Aspirin  Nausea And Vomiting and Other (See Comments)    Ok to take tylenol  or ibuprofen     Penicillins Other (See Comments)    Unknown reaction from childhood: family would like this to remain as an allergy.  Tolerated cephalosporins and zosyn     Level of Care/Admitting Diagnosis ED Disposition     ED Disposition  Admit   Condition  --   Comment  Hospital Area: [facility] [[id number]]  Level of Care: Progressive [102]  Admit to Progressive based on following criteria: NEUROLOGICAL AND NEUROSURGICAL complex patients with significant risk of instability, who do not meet ICU criteria, yet require close observation or frequent assessment (< / = every 2 - 4 hours) with medical / nursing intervention.  May place patient in observation at [facility] or [facility] if equivalent level of care is available:: No  Covid Evaluation: Asymptomatic - no recent exposure (last 10 days) testing not required  Diagnosis: Seizure [facility]) [[id number]]  Admitting Physician: FRANKY REDIA SAILOR [[id number]]  Attending Physician: FRANKY REDIA SAILOR [phone]  For patients discharging to extended facilities (i.e. SNF, AL, group homes or LTAC) initiate:: Discharge to SNF/Facility Placement COVID-19 Lab Testing Protocol          B Medical/Surgery History Past Medical History:  Diagnosis Date   Abdominal pain     Accidental drug overdose April 2013   Anxiety    Atrial fibrillation (HCC) 09/29/11   converted spontaneously   Chronic back pain    Chronic knee pain    Chronic nausea    Chronic pain    COPD (chronic obstructive pulmonary disease) (HCC)    Depression    Diabetes mellitus    states her doctor took her off all DM meds in past month   Diabetic neuropathy (HCC)    Dyspnea    with exertion    GERD (gastroesophageal reflux disease)    Headache(784.0)    migraines    HTN (hypertension)    not on meds since in a year    Hyperlipidemia    Hypothyroidism    not on meds in a while    Mental disorder    Bipolar and schizophrenic   Nausea and vomiting 01/02/2023   Requires supplemental oxygen     as needed per patient    Schizophrenia (HCC)    Schizophrenia, acute (HCC) 11/13/2017   Tobacco abuse    Past Surgical History:  Procedure Laterality Date   ABDOMINAL HYSTERECTOMY     BLADDER SUSPENSION  03/04/2011   Procedure: [facility] PROCEDURE;  Surgeon: Glendia LABOR MacDiarmid;  Location: [facility];  Service: Urology;  Laterality: N/A;   BOWEL RESECTION N/A 04/18/2022   Procedure: SMALL BOWEL RESECTION;  Surgeon: Vanderbilt Ned, MD;  Location: [facility] OR;  Service: General;  Laterality: N/A;   CYSTOCELE REPAIR  03/04/2011   Procedure: ANTERIOR REPAIR (CYSTOCELE);  Surgeon: Glendia LABOR Elizabeth;  Location: [facility];  Service:  Urology;  Laterality: N/A;   CYSTOSCOPY  03/04/2011   Procedure: CYSTOSCOPY;  Surgeon: Glendia LABOR MacDiarmid;  Location: [facility];  Service: Urology;  Laterality: N/A;   ESOPHAGOGASTRODUODENOSCOPY (EGD) WITH PROPOFOL  N/A 05/12/2017   Procedure: ESOPHAGOGASTRODUODENOSCOPY (EGD) WITH PROPOFOL ;  Surgeon: Ethyl Lenis, MD;  Location: [facility];  Service: General;  Laterality: N/A;   GASTRIC ROUX-EN-Y N/A 03/25/2016   Procedure: LAPAROSCOPIC ROUX-EN-Y GASTRIC BYPASS WITH UPPER ENDOSCOPY;  Surgeon: Morene Olives, MD;  Location: [facility];  Service: General;  Laterality: N/A;   KNEE SURGERY      LAPAROSCOPIC ASSISTED VAGINAL HYSTERECTOMY  03/04/2011   Procedure: LAPAROSCOPIC ASSISTED VAGINAL HYSTERECTOMY;  Surgeon: Rosaline LITTIE Cobble, MD;  Location: [facility];  Service: Gynecology;  Laterality: N/A;   LAPAROTOMY N/A 04/18/2022   Procedure: EXPLORATORY LAPAROTOMY;  Surgeon: Vanderbilt Ned, MD;  Location: [facility];  Service: General;  Laterality: N/A;   LAPAROTOMY N/A 04/24/2022   Procedure: BRING BACK EXPLORATORY LAPAROTOMY;  Surgeon: Sebastian Moles, MD;  Location: [facility] OR;  Service: General;  Laterality: N/A;   TOTAL HIP ARTHROPLASTY Right 08/27/2022   Procedure: TOTAL HIP ARTHROPLASTY;  Surgeon: Edna Toribio LABOR, MD;  Location: [facility];  Service: Orthopedics;  Laterality: Right;     A IV Location/Drains/Wounds Patient Lines/Drains/Airways Status     Active Line/Drains/Airways     Name Placement date Placement time Site Days   Peripheral IV 01/23/24 20 G 1 Anterior;Left;Proximal Forearm 01/23/24  1904  Forearm  1   Wound 12/12/23 0730 Other (Comment) Breast Lateral;Lower;Right 12/12/23  0730  Breast  43            Intake/Output Last 24 hours No intake or output data in the 24 hours ending 01/24/24 0340  Labs/Imaging Results for orders placed or performed during the hospital encounter of 01/23/24 (from the past 48 hours)  CBC with Differential     Status: Abnormal   Collection Time: 01/23/24  7:00 PM  Result Value Ref Range   WBC 5.6 4.0 - 10.5 K/uL   RBC 4.68 3.87 - 5.11 MIL/uL   Hemoglobin 10.3 (L) 12.0 - 15.0 g/dL   HCT 65.6 (L) 63.9 - 53.9 %   MCV 73.3 (L) 80.0 - 100.0 fL   MCH 22.0 (L) 26.0 - 34.0 pg   MCHC 30.0 30.0 - 36.0 g/dL   RDW 79.8 (H) 88.4 - 84.4 %   Platelets 236 150 - 400 K/uL   nRBC 0.0 0.0 - 0.2 %   Neutrophils Relative % 56 %   Neutro Abs 3.2 1.7 - 7.7 K/uL   Lymphocytes Relative 36 %   Lymphs Abs 2.0 0.7 - 4.0 K/uL   Monocytes Relative 7 %   Monocytes Absolute 0.4 0.1 - 1.0 K/uL   Eosinophils Relative 0 %   Eosinophils Absolute 0.0 0.0 - 0.5 K/uL    Basophils Relative 1 %   Basophils Absolute 0.0 0.0 - 0.1 K/uL   Immature Granulocytes 0 %   Abs Immature Granulocytes 0.00 0.00 - 0.07 K/uL    Comment: Performed at [facility], [address], [place], [place] [address]  Comprehensive metabolic panel     Status: Abnormal   Collection Time: 01/23/24  7:00 PM  Result Value Ref Range   Sodium 138 135 - 145 mmol/L   Potassium 3.6 3.5 - 5.1 mmol/L   Chloride 102 98 - 111 mmol/L   CO2 20 (L) 22 - 32 mmol/L   Glucose, Bld 126 (H) 70 - 99 mg/dL  Comment: Glucose reference range applies only to samples taken after fasting for at least 8 hours.   BUN 9 6 - 20 mg/dL   Creatinine, Ser 9.41 0.44 - 1.00 mg/dL   Calcium  8.6 (L) 8.9 - 10.3 mg/dL   Total Protein 6.8 6.5 - 8.1 g/dL   Albumin  3.5 3.5 - 5.0 g/dL   AST 28 15 - 41 U/L   ALT 18 0 - 44 U/L   Alkaline Phosphatase 88 38 - 126 U/L   Total Bilirubin 0.9 0.0 - 1.2 mg/dL   GFR, Estimated >39 >39 mL/min    Comment: (NOTE) Calculated using the CKD-EPI Creatinine Equation (2021)    Anion gap 16 (H) 5 - 15    Comment: Performed at [facility], [address] [place], [place] [address]  Brain natriuretic peptide     Status: None   Collection Time: 01/23/24  7:00 PM  Result Value Ref Range   B Natriuretic Peptide 17.0 0.0 - 100.0 pg/mL    Comment: Performed at [facility], [address] [place], [place] [address]  Troponin I (High Sensitivity)     Status: None   Collection Time: 01/23/24  7:00 PM  Result Value Ref Range   Troponin I (High Sensitivity) 3 <18 ng/L    Comment: (NOTE) Elevated high sensitivity troponin I (hsTnI) values and significant  changes across serial measurements may suggest ACS but many other  chronic and acute conditions are known to elevate hsTnI results.  Refer to the Links section for chest pain algorithms and additional  guidance. Performed at [facility], [address] [place], [place] [address]   Lipase, blood      Status: None   Collection Time: 01/23/24  7:00 PM  Result Value Ref Range   Lipase 45 11 - 51 U/L    Comment: Performed at [facility], [address] [place], [place] [address]  I-Stat CG4 Lactic Acid     Status: Abnormal   Collection Time: 01/23/24  7:12 PM  Result Value Ref Range   Lactic Acid, Venous 4.3 (HH) 0.5 - 1.9 mmol/L   Comment NOTIFIED PHYSICIAN   Resp panel by RT-PCR (RSV, Flu A&B, Covid) Anterior Nasal Swab     Status: None   Collection Time: 01/23/24  7:16 PM   Specimen: Anterior Nasal Swab  Result Value Ref Range   SARS Coronavirus 2 by RT PCR NEGATIVE NEGATIVE   Influenza A by PCR NEGATIVE NEGATIVE   Influenza B by PCR NEGATIVE NEGATIVE    Comment: (NOTE) The Xpert Xpress SARS-CoV-2/FLU/RSV plus assay is intended as an aid in the diagnosis of influenza from Nasopharyngeal swab specimens and should not be used as a sole basis for treatment. Nasal washings and aspirates are unacceptable for Xpert Xpress SARS-CoV-2/FLU/RSV testing.  Fact Sheet for Patients: BloggerCourse.com  Fact Sheet for Healthcare Providers: SeriousBroker.it  This test is not yet approved or cleared by the [place]  FDA and has been authorized for detection and/or diagnosis of SARS-CoV-2 by FDA under an Emergency Use Authorization (EUA). This EUA will remain in effect (meaning this test can be used) for the duration of the COVID-19 declaration under Section 564(b)(1) of the Act, 21 U.S.C. section 360bbb-3(b)(1), unless the authorization is terminated or revoked.     Resp Syncytial Virus by PCR NEGATIVE NEGATIVE    Comment: (NOTE) Fact Sheet for Patients: BloggerCourse.com  Fact Sheet for Healthcare Providers: SeriousBroker.it  This test is not yet  approved or cleared by the [place]  FDA and has been authorized for detection and/or diagnosis of SARS-CoV-2 by FDA under an  Emergency Use Authorization (EUA). This EUA will remain in effect (meaning this test can be used) for the duration of the COVID-19 declaration under Section 564(b)(1) of the Act, 21 U.S.C. section 360bbb-3(b)(1), unless the authorization is terminated or revoked.  Performed at [facility], [address] [place], [place] [address]   Urinalysis, w/ Reflex to Culture (Infection Suspected) -Urine, Clean Catch     Status: Abnormal   Collection Time: 01/23/24  8:03 PM  Result Value Ref Range   Specimen Source URINE, CLEAN CATCH    Color, Urine STRAW (A) YELLOW   APPearance CLEAR CLEAR   Specific Gravity, Urine 1.002 (L) 1.005 - 1.030   pH 5.0 5.0 - 8.0   Glucose, UA NEGATIVE NEGATIVE mg/dL   Hgb urine dipstick NEGATIVE NEGATIVE   Bilirubin Urine NEGATIVE NEGATIVE   Ketones, ur NEGATIVE NEGATIVE mg/dL   Protein, ur NEGATIVE NEGATIVE mg/dL   Nitrite NEGATIVE NEGATIVE   Leukocytes,Ua NEGATIVE NEGATIVE   RBC / HPF 0-5 0 - 5 RBC/hpf   WBC, UA 0-5 0 - 5 WBC/hpf    Comment:        Reflex urine culture not performed if WBC <=10, OR if Squamous epithelial cells >5. If Squamous epithelial cells >5 suggest recollection.    Bacteria, UA RARE (A) NONE SEEN   Squamous Epithelial / HPF 0-5 0 - 5 /HPF   Mucus PRESENT     Comment: Performed at [facility], [address], [place], [place] [address]  Troponin I (High Sensitivity)     Status: None   Collection Time: 01/23/24  8:59 PM  Result Value Ref Range   Troponin I (High Sensitivity) 3 <18 ng/L    Comment: (NOTE) Elevated high sensitivity troponin I (hsTnI) values and significant  changes across serial measurements may suggest ACS but many other  chronic and acute conditions are known to elevate hsTnI results.  Refer to the Links section for chest pain algorithms and additional  guidance. Performed at [facility], [address], [place], [place] [address]   I-Stat CG4 Lactic Acid     Status: Abnormal    Collection Time: 01/23/24 11:03 PM  Result Value Ref Range   Lactic Acid, Venous 6.4 (HH) 0.5 - 1.9 mmol/L   Comment NOTIFIED PHYSICIAN   Comprehensive metabolic panel     Status: Abnormal   Collection Time: 01/23/24 11:15 PM  Result Value Ref Range   Sodium 134 (L) 135 - 145 mmol/L   Potassium 3.5 3.5 - 5.1 mmol/L   Chloride 101 98 - 111 mmol/L   CO2 19 (L) 22 - 32 mmol/L   Glucose, Bld 270 (H) 70 - 99 mg/dL    Comment: Glucose reference range applies only to samples taken after fasting for at least 8 hours.   BUN 7 6 - 20 mg/dL   Creatinine, Ser 9.25 0.44 - 1.00 mg/dL   Calcium  8.4 (L) 8.9 - 10.3 mg/dL   Total Protein 6.6 6.5 - 8.1 g/dL   Albumin  3.4 (L) 3.5 - 5.0 g/dL   AST 32 15 - 41 U/L   ALT 18 0 - 44 U/L   Alkaline Phosphatase 86 38 - 126 U/L   Total Bilirubin 0.5 0.0 - 1.2 mg/dL   GFR, Estimated >39 >39 mL/min    Comment: (NOTE) Calculated using the CKD-EPI Creatinine  Equation (2021)    Anion gap 14 5 - 15    Comment: Performed at [facility], [address], [place], [place] [address]  I-Stat venous blood gas, [facility], [facility], [facility])     Status: Abnormal   Collection Time: 01/23/24 11:24 PM  Result Value Ref Range   pH, Ven 7.362 7.25 - 7.43   pCO2, Ven 34.0 (L) 44 - 60 mmHg   pO2, Ven 48 (H) 32 - 45 mmHg   Bicarbonate 19.3 (L) 20.0 - 28.0 mmol/L   TCO2 20 (L) 22 - 32 mmol/L   O2 Saturation 82 %   Acid-base deficit 5.0 (H) 0.0 - 2.0 mmol/L   Sodium 135 135 - 145 mmol/L   Potassium 3.6 3.5 - 5.1 mmol/L   Calcium , Ion 1.08 (L) 1.15 - 1.40 mmol/L   HCT 34.0 (L) 36.0 - 46.0 %   Hemoglobin 11.6 (L) 12.0 - 15.0 g/dL   Sample type VENOUS   I-Stat CG4 Lactic Acid     Status: Abnormal   Collection Time: 01/23/24 11:28 PM  Result Value Ref Range   Lactic Acid, Venous 6.0 (HH) 0.5 - 1.9 mmol/L   Comment NOTIFIED PHYSICIAN   I-Stat CG4 Lactic Acid     Status: Abnormal   Collection Time: 01/24/24  1:38 AM  Result Value Ref Range   Lactic Acid, Venous 5.8 (HH) 0.5 -  1.9 mmol/L   Comment NOTIFIED PHYSICIAN   CBG monitoring, ED     Status: Abnormal   Collection Time: 01/24/24  3:25 AM  Result Value Ref Range   Glucose-Capillary 406 (H) 70 - 99 mg/dL    Comment: Glucose reference range applies only to samples taken after fasting for at least 8 hours.   Comment 1 Notify RN    Comment 2 Document in Chart    CT ABDOMEN PELVIS W CONTRAST Result Date: 01/23/2024 EXAM: CTA CHEST PE WITHOUT AND WITH CONTRAST CT ABDOMEN AND PELVIS WITHOUT AND WITH CONTRAST 01/23/2024 08:39:45 PM TECHNIQUE: CTA of the chest was performed after the administration of intravenous contrast. Multiplanar reformatted images are provided for review. MIP images are provided for review. CT of the abdomen and pelvis was performed with the administration of intravenous contrast. Automated exposure control, iterative reconstruction, and/or weight based adjustment of the mA/kV was utilized to reduce the radiation dose to as low as reasonably achievable. COMPARISON: Same-day chest radiograph. CTA chest December 02, 2023. CT abdomen and pelvis December 17, 2023. CLINICAL HISTORY: Pulmonary embolism (PE) suspected, high prob. Nausea, vomiting, non-localized abdominal pain. FINDINGS: CHEST: PULMONARY ARTERIES: Pulmonary arteries are adequately opacified for evaluation. No intraluminal filling defect to suggest pulmonary embolism. Dilated main pulmonary artery measuring 33 mm which can be seen with pulmonary hypertension. MEDIASTINUM: No mediastinal lymphadenopathy. The heart and pericardium demonstrate no acute abnormality. Normal caliber thoracic aorta without dissection. Aortic and coronary artery atherosclerotic calcifications. LUNGS AND PLEURA: Emphysema. No focal consolidation, pleural effusion, or pneumothorax. SOFT TISSUES AND BONES: Chronic compression fractures of T1, T4, T6, and T9. No acute fracture. ABDOMEN AND PELVIS: LIVER: Hepatic steatosis. GALLBLADDER AND BILE DUCTS: Gallbladder is unremarkable. No  biliary ductal dilatation. SPLEEN: Spleen demonstrates no acute abnormality. PANCREAS: Pancreas demonstrates no acute abnormality. ADRENAL GLANDS: Adrenal glands demonstrate no acute abnormality. KIDNEYS, URETERS AND BLADDER: No stones in the kidneys or ureters. No hydronephrosis. No perinephric or periureteral stranding. Urinary bladder is partially obscured by streak artifact. GI AND BOWEL: Postoperative change of gastric bypass. Diffuse esophageal wall thickening. Correlate for  esophagitis. There is no bowel obstruction. No abnormal bowel wall thickening or distension. REPRODUCTIVE: Reproductive organs are unremarkable. PERITONEUM AND RETROPERITONEUM: No ascites or free air. LYMPH NODES: No lymphadenopathy. BONES AND SOFT TISSUES: Right hip arthroplasty. No focal soft tissue abnormality. IMPRESSION: 1. No pulmonary embolism. 2. Dilated main pulmonary artery measuring 33 mm, consistent with pulmonary hypertension. 3. Emphysema. 4. Diffuse esophageal wall thickening, correlate for esophagitis. 5. Hepatic steatosis. Electronically signed by: Norman Gatlin MD 01/23/2024 09:04 PM EDT RP Workstation: [id number]   CT Angio Chest PE W and/or Wo Contrast Result Date: 01/23/2024 EXAM: CTA CHEST PE WITHOUT AND WITH CONTRAST CT ABDOMEN AND PELVIS WITHOUT AND WITH CONTRAST 01/23/2024 08:39:45 PM TECHNIQUE: CTA of the chest was performed after the administration of intravenous contrast. Multiplanar reformatted images are provided for review. MIP images are provided for review. CT of the abdomen and pelvis was performed with the administration of intravenous contrast. Automated exposure control, iterative reconstruction, and/or weight based adjustment of the mA/kV was utilized to reduce the radiation dose to as low as reasonably achievable. COMPARISON: Same-day chest radiograph. CTA chest December 02, 2023. CT abdomen and pelvis December 17, 2023. CLINICAL HISTORY: Pulmonary embolism (PE) suspected, high prob. Nausea, vomiting,  non-localized abdominal pain. FINDINGS: CHEST: PULMONARY ARTERIES: Pulmonary arteries are adequately opacified for evaluation. No intraluminal filling defect to suggest pulmonary embolism. Dilated main pulmonary artery measuring 33 mm which can be seen with pulmonary hypertension. MEDIASTINUM: No mediastinal lymphadenopathy. The heart and pericardium demonstrate no acute abnormality. Normal caliber thoracic aorta without dissection. Aortic and coronary artery atherosclerotic calcifications. LUNGS AND PLEURA: Emphysema. No focal consolidation, pleural effusion, or pneumothorax. SOFT TISSUES AND BONES: Chronic compression fractures of T1, T4, T6, and T9. No acute fracture. ABDOMEN AND PELVIS: LIVER: Hepatic steatosis. GALLBLADDER AND BILE DUCTS: Gallbladder is unremarkable. No biliary ductal dilatation. SPLEEN: Spleen demonstrates no acute abnormality. PANCREAS: Pancreas demonstrates no acute abnormality. ADRENAL GLANDS: Adrenal glands demonstrate no acute abnormality. KIDNEYS, URETERS AND BLADDER: No stones in the kidneys or ureters. No hydronephrosis. No perinephric or periureteral stranding. Urinary bladder is partially obscured by streak artifact. GI AND BOWEL: Postoperative change of gastric bypass. Diffuse esophageal wall thickening. Correlate for esophagitis. There is no bowel obstruction. No abnormal bowel wall thickening or distension. REPRODUCTIVE: Reproductive organs are unremarkable. PERITONEUM AND RETROPERITONEUM: No ascites or free air. LYMPH NODES: No lymphadenopathy. BONES AND SOFT TISSUES: Right hip arthroplasty. No focal soft tissue abnormality. IMPRESSION: 1. No pulmonary embolism. 2. Dilated main pulmonary artery measuring 33 mm, consistent with pulmonary hypertension. 3. Emphysema. 4. Diffuse esophageal wall thickening, correlate for esophagitis. 5. Hepatic steatosis. Electronically signed by: Norman Gatlin MD 01/23/2024 09:04 PM EDT RP Workstation: [id number]   DG Chest Portable 1 View Result  Date: 01/23/2024 EXAM: 1 VIEW XRAY OF THE CHEST 01/23/2024 08:02:37 PM COMPARISON: Radiograph 3125. CLINICAL HISTORY: SOB. C/O dyspnea with mild wheezing. FINDINGS: LUNGS AND PLEURA: Left basilar atelectasis. Otherwise no focal pulmonary opacity. No pulmonary edema. No pleural effusion. No pneumothorax. HEART AND MEDIASTINUM: No acute abnormality of the cardiac and mediastinal silhouettes. BONES AND SOFT TISSUES: No acute osseous abnormality. IMPRESSION: 1. No acute process. Electronically signed by: Norman Gatlin MD 01/23/2024 08:10 PM EDT RP Workstation: [id number]    Pending Labs Unresulted Labs (From admission, onward)     Start     Ordered   01/24/24 0038  Rapid urine drug screen (hospital performed)  Add-on,   AD        01/24/24 0037  Vitals/Pain Today's Vitals   01/24/24 0100 01/24/24 0145 01/24/24 0200 01/24/24 0215  BP: 115/74 131/71 (!) 121/94 121/75  Pulse: (!) 128 (!) 122 (!) 132 (!) 123  Resp: 19 20 16  (!) 27  Temp:      TempSrc:      SpO2: 100% 96% 97% 95%  Weight:      Height:      PainSc:        Isolation Precautions No active isolations  Medications Medications  nicotine  (NICODERM CQ  - dosed in mg/24 hours) patch 21 mg (21 mg Transdermal Patch Applied 01/23/24 2122)  thiamine  (VITAMIN B1) 500 mg in sodium chloride  0.9 % 50 mL IVPB (has no administration in time range)  ondansetron  (ZOFRAN ) injection 4 mg (4 mg Intravenous Given 01/23/24 1913)  methylPREDNISolone  sodium succinate  (SOLU-MEDROL ) 125 mg/2 mL injection 125 mg (125 mg Intravenous Given 01/23/24 1913)  ipratropium-albuterol  (DUONEB) 0.5-2.5 (3) MG/3ML nebulizer solution 3 mL (3 mLs Nebulization Given 01/23/24 2002)  lactated ringers  bolus 1,000 mL (0 mLs Intravenous Stopped 01/23/24 2217)  morphine  (PF) 2 MG/ML injection 2 mg (2 mg Intravenous Given 01/23/24 2025)  metoCLOPramide  (REGLAN ) injection 10 mg (10 mg Intravenous Given 01/23/24 2101)  diphenhydrAMINE  (BENADRYL ) injection 25 mg (25 mg  Intravenous Given 01/23/24 2100)  iohexol  (OMNIPAQUE ) 350 MG/ML injection 75 mL (75 mLs Intravenous Contrast Given 01/23/24 2050)  ipratropium-albuterol  (DUONEB) 0.5-2.5 (3) MG/3ML nebulizer solution 3 mL (3 mLs Nebulization Given 01/24/24 0037)  diazepam  (VALIUM ) injection 2.5 mg (2.5 mg Intravenous Given 01/23/24 2326)  diazepam  (VALIUM ) injection 2.5 mg (2.5 mg Intravenous Given 01/24/24 0037)  LORazepam  (ATIVAN ) injection 2 mg (2 mg Intravenous Given 01/24/24 0313)  levETIRAcetam  (KEPPRA ) undiluted injection 1,500 mg (1,500 mg Intravenous Given 01/24/24 0327)    Mobility walks     Focused Assessments Neuro Assessment Handoff:  Swallow screen pass? Not ordered Cardiac Rhythm: Sinus tachycardia (admit)       Neuro Assessment: Exceptions to WDL Neuro Checks:      Has TPA been given? No If patient is a Neuro Trauma and patient is going to OR before floor call report to 4N Charge nurse: [phone] or [phone]  , Pulmonary Assessment Handoff:  Lung sounds: Bilateral Breath Sounds: Diminished L Breath Sounds: Diminished R Breath Sounds: Diminished O2 Device: Room Air      R Recommendations: See Admitting Provider Note  Report given to:   Additional Notes:

## 2024-01-24 NOTE — Progress Notes (Addendum)
 Upon arrival to floor patient postictal, alert only to self and responds to loud voices or painful stimulus. Blood sugar 346. Rails padded for seizure precautions. Unable to complete full admission or physical assessment at this time.

## 2024-01-24 NOTE — H&P (Signed)
 History and Physical    [name] FMW:[id number] DOB: July 25, 1965 DOA: 01/23/2024  Patient coming from: Home.  Chief Complaint: Wheezing and may have had a seizure.  HPI: [name] is a 58 y.o. female with COPD alcohol  abuse, seizures, depression, diabetes mellitus type 2 presents to the ER with complaints of having wheezing over the last few days.  Patient states that when she woke up this morning she felt short of breath but soon she fell onto the floor and thinks she may have had a seizure.  Patient states that she missed her Keppra  and zonisamide  last few days.  As she ran out of it.  Two months ago patient was admitted for sepsis and respiratory failure required intubation.  ED Course: In the ER patient was found to be wheezing had CT chest abdomen pelvis done which did not show anything acute.  Labs show lactic acidosis.  Troponins and BNP unremarkable.  Hemoglobin 10.3.  Patient was given fluids for the lactic acidosis.  While in the ER at the time of my exam patient went into a generalized tonic-clonic seizure lasting for about 1 minute and became postictal.  I ordered 2 mg IV Ativan  stat and discussed with neurologist Dr. Michaela who advised loading with Keppra  1500 mg and continue home dose.  Patient admitted for breakthrough seizures with COPD exacerbation.  CT of the pelvis lumbar spine and head did not show any acute.  Review of Systems: As per HPI, rest all negative.   Past Medical History:  Diagnosis Date   Abdominal pain    Accidental drug overdose April 2013   Anxiety    Atrial fibrillation (HCC) 09/29/11   converted spontaneously   Chronic back pain    Chronic knee pain    Chronic nausea    Chronic pain    COPD (chronic obstructive pulmonary disease) (HCC)    Depression    Diabetes mellitus    states her doctor took her off all DM meds in past month   Diabetic neuropathy (HCC)    Dyspnea    with exertion    GERD (gastroesophageal reflux disease)     Headache(784.0)    migraines    HTN (hypertension)    not on meds since in a year    Hyperlipidemia    Hypothyroidism    not on meds in a while    Mental disorder    Bipolar and schizophrenic   Nausea and vomiting 01/02/2023   Requires supplemental oxygen     as needed per patient    Schizophrenia (HCC)    Schizophrenia, acute (HCC) 11/13/2017   Tobacco abuse     Past Surgical History:  Procedure Laterality Date   ABDOMINAL HYSTERECTOMY     BLADDER SUSPENSION  03/04/2011   Procedure: [facility] PROCEDURE;  Surgeon: Glendia LABOR MacDiarmid;  Location: [facility];  Service: Urology;  Laterality: N/A;   BOWEL RESECTION N/A 04/18/2022   Procedure: SMALL BOWEL RESECTION;  Surgeon: Vanderbilt Ned, MD;  Location: [facility] OR;  Service: General;  Laterality: N/A;   CYSTOCELE REPAIR  03/04/2011   Procedure: ANTERIOR REPAIR (CYSTOCELE);  Surgeon: Glendia LABOR Elizabeth;  Location: [facility];  Service: Urology;  Laterality: N/A;   CYSTOSCOPY  03/04/2011   Procedure: CYSTOSCOPY;  Surgeon: Glendia LABOR MacDiarmid;  Location: [facility];  Service: Urology;  Laterality: N/A;   ESOPHAGOGASTRODUODENOSCOPY (EGD) WITH PROPOFOL  N/A 05/12/2017   Procedure: ESOPHAGOGASTRODUODENOSCOPY (EGD) WITH PROPOFOL ;  Surgeon: Ethyl Lenis, MD;  Location: [facility];  Service: General;  Laterality: N/A;   GASTRIC ROUX-EN-Y N/A 03/25/2016   Procedure: LAPAROSCOPIC ROUX-EN-Y GASTRIC BYPASS WITH UPPER ENDOSCOPY;  Surgeon: Morene Olives, MD;  Location: [facility];  Service: General;  Laterality: N/A;   KNEE SURGERY     LAPAROSCOPIC ASSISTED VAGINAL HYSTERECTOMY  03/04/2011   Procedure: LAPAROSCOPIC ASSISTED VAGINAL HYSTERECTOMY;  Surgeon: Rosaline LITTIE Cobble, MD;  Location: [facility];  Service: Gynecology;  Laterality: N/A;   LAPAROTOMY N/A 04/18/2022   Procedure: EXPLORATORY LAPAROTOMY;  Surgeon: Vanderbilt Ned, MD;  Location: [facility];  Service: General;  Laterality: N/A;   LAPAROTOMY N/A 04/24/2022   Procedure: BRING BACK EXPLORATORY LAPAROTOMY;  Surgeon:  Sebastian Moles, MD;  Location: [facility] OR;  Service: General;  Laterality: N/A;   TOTAL HIP ARTHROPLASTY Right 08/27/2022   Procedure: TOTAL HIP ARTHROPLASTY;  Surgeon: Edna Toribio LABOR, MD;  Location: [facility] OR;  Service: Orthopedics;  Laterality: Right;     reports that she has been smoking cigarettes. She has never used smokeless tobacco. She reports current alcohol  use. She reports current drug use. Drugs: Cocaine, Marijuana, and Crack cocaine.  Allergies  Allergen Reactions   Iron  Dextran Shortness Of Breath and Anxiety   Aspirin  Nausea And Vomiting and Other (See Comments)    Ok to take tylenol  or ibuprofen     Penicillins Other (See Comments)    Unknown reaction from childhood: family would like this to remain as an allergy.  Tolerated cephalosporins and zosyn     Family History  Problem Relation Age of Onset   Heart attack Father        34s   Diabetes Mother    Heart disease Mother    Hypertension Mother    Heart attack Sister        60   COPD Other    Breast cancer Neg Hx     Prior to Admission medications   Medication Sig Start Date End Date Taking? Authorizing Provider  albuterol  (VENTOLIN  HFA) 108 (90 Base) MCG/ACT inhaler Inhale 2 puffs into the lungs every 4 (four) hours as needed for wheezing or shortness of breath. 11/30/23  Yes Bernard Drivers, MD  metFORMIN  (GLUCOPHAGE -XR) 750 MG 24 hr tablet Take 1 tablet (750 mg total) by mouth daily with breakfast. 12/13/23  Yes Singh, Prashant K, MD  ARIPiprazole  (ABILIFY ) 10 MG tablet Take 1 tablet (10 mg total) by mouth daily. Patient not taking: Reported on 01/23/2024 11/01/23   Samtani, Jai-Gurmukh, MD  divalproex  (DEPAKOTE  SPRINKLE) 125 MG capsule Take 2 capsules (250 mg total) by mouth every 12 (twelve) hours. Patient not taking: Reported on 01/23/2024 11/14/23   Raenelle Donalda HERO, MD  fluticasone  furoate-vilanterol (BREO ELLIPTA ) 100-25 MCG/ACT AEPB Inhale 1 puff into the lungs daily. Patient not taking: Reported on 01/23/2024  11/14/23   Raenelle Donalda HERO, MD  folic acid  (FOLVITE ) 1 MG tablet Take 1 tablet (1 mg total) by mouth daily. Patient not taking: Reported on 01/23/2024 12/13/23   Singh, Prashant K, MD  ipratropium-albuterol  (DUONEB) 0.5-2.5 (3) MG/3ML SOLN Take 3 mLs by nebulization every 6 (six) hours as needed (for shortness of breath or wheezing). Patient not taking: Reported on 01/23/2024    [provider]  levETIRAcetam  (KEPPRA  XR) 500 MG 24 hr tablet Take 1 tablet (500 mg total) by mouth 2 (two) times daily. 10/31/23   Samtani, Jai-Gurmukh, MD  metoprolol  tartrate (LOPRESSOR ) 25 MG tablet Take 1 tablet (25 mg total) by mouth 2 (two) times daily. 12/13/23   Singh, Prashant K, MD  neomycin -bacitracin -polymyxin [phone] OINT Apply 1  Application topically 3 (three) times daily. Apply to your right nipple 3 times a day for 2 weeks 12/13/23   Singh, Prashant K, MD  nicotine  (NICODERM CQ  - DOSED IN MG/24 HOURS) 21 mg/24hr patch Place 1 patch (21 mg total) onto the skin daily. 12/13/23   Singh, Prashant K, MD  OXYGEN  Inhale 4-5 L/min into the lungs daily as needed (for COPD-related symptoms).    [provider]  pantoprazole  (PROTONIX ) 40 MG tablet Take 1 tablet (40 mg total) by mouth daily. Patient not taking: Reported on 01/23/2024 12/13/23   Singh, Prashant K, MD  thiamine  (VITAMIN B-1) 100 MG tablet Take 1 tablet (100 mg total) by mouth daily. 11/14/23   Ghimire, Donalda HERO, MD  umeclidinium bromide  (INCRUSE ELLIPTA ) 62.5 MCG/ACT AEPB Inhale 1 puff into the lungs daily. 11/14/23   Ghimire, Donalda HERO, MD  zonisamide  (ZONEGRAN ) 100 MG capsule Take 2 capsules (200 mg total) by mouth daily. 10/31/23   Samtani, Jai-Gurmukh, MD    Physical Exam: Constitutional: Moderately built and nourished. Vitals:   01/24/24 0345 01/24/24 0400 01/24/24 0413 01/24/24 0415  BP: 106/65 109/68 101/63 95/62  Pulse: (!) 122 (!) 121 (!) 118 (!) 117  Resp: (!) 28 (!) 22 20 19   Temp:      TempSrc:      SpO2: 93% 90% 95% 95%   Weight:      Height:       Eyes: Anicteric no pallor. ENMT: No discharge from the ears eyes nose and mouth. Neck: No mass felt.  No neck rigidity. Respiratory: Mild expiratory wheeze and no crepitations. Cardiovascular: S1-S2 heard. Abdomen: Soft nontender bowel sounds present. Musculoskeletal: No edema. Skin: No rash. Neurologic: At the time of my exam initially patient was alert awake and talking and went into seizures.  Following which patient became postictal. Psychiatric: See neurology assessment.   Labs on Admission: I have personally reviewed following labs and imaging studies  CBC: Recent Labs  Lab 01/23/24 1900 01/23/24 2324  WBC 5.6  --   NEUTROABS 3.2  --   HGB 10.3* 11.6*  HCT 34.3* 34.0*  MCV 73.3*  --   PLT 236  --    Basic Metabolic Panel: Recent Labs  Lab 01/23/24 1900 01/23/24 2315 01/23/24 2324  NA 138 134* 135  K 3.6 3.5 3.6  CL 102 101  --   CO2 20* 19*  --   GLUCOSE 126* 270*  --   BUN 9 7  --   CREATININE 0.58 0.74  --   CALCIUM  8.6* 8.4*  --    GFR: Estimated Creatinine Clearance: 59.9 mL/min (by C-G formula based on SCr of 0.74 mg/dL). Liver Function Tests: Recent Labs  Lab 01/23/24 1900 01/23/24 2315  AST 28 32  ALT 18 18  ALKPHOS 88 86  BILITOT 0.9 0.5  PROT 6.8 6.6  ALBUMIN  3.5 3.4*   Recent Labs  Lab 01/23/24 1900  LIPASE 45   No results for input(s): AMMONIA in the last 168 hours. Coagulation Profile: No results for input(s): INR, PROTIME in the last 168 hours. Cardiac Enzymes: No results for input(s): CKTOTAL, CKMB, CKMBINDEX, TROPONINI in the last 168 hours. BNP (last 3 results) No results for input(s): PROBNP in the last 8760 hours. HbA1C: No results for input(s): HGBA1C in the last 72 hours. CBG: Recent Labs  Lab 01/24/24 0325  GLUCAP 406*   Lipid Profile: No results for input(s): CHOL, HDL, LDLCALC, TRIG, CHOLHDL, LDLDIRECT in the last 72 hours. Thyroid  Function  Tests: No  results for input(s): TSH, T4TOTAL, FREET4, T3FREE, THYROIDAB in the last 72 hours. Anemia Panel: No results for input(s): VITAMINB12, FOLATE, FERRITIN, TIBC, IRON , RETICCTPCT in the last 72 hours. Urine analysis:    Component Value Date/Time   COLORURINE STRAW (A) 01/23/2024 2003   APPEARANCEUR CLEAR 01/23/2024 2003   LABSPEC 1.002 (L) 01/23/2024 2003   PHURINE 5.0 01/23/2024 2003   GLUCOSEU NEGATIVE 01/23/2024 2003   HGBUR NEGATIVE 01/23/2024 2003   BILIRUBINUR NEGATIVE 01/23/2024 2003   KETONESUR NEGATIVE 01/23/2024 2003   PROTEINUR NEGATIVE 01/23/2024 2003   UROBILINOGEN 0.2 09/04/2013 0426   NITRITE NEGATIVE 01/23/2024 2003   LEUKOCYTESUR NEGATIVE 01/23/2024 2003   Sepsis Labs: @LABRCNTIP (procalcitonin:4,lacticidven:4) ) Recent Results (from the past 240 hours)  Resp panel by RT-PCR (RSV, Flu A&B, Covid) Anterior Nasal Swab     Status: None   Collection Time: 01/23/24  7:16 PM   Specimen: Anterior Nasal Swab  Result Value Ref Range Status   SARS Coronavirus 2 by RT PCR NEGATIVE NEGATIVE Final   Influenza A by PCR NEGATIVE NEGATIVE Final   Influenza B by PCR NEGATIVE NEGATIVE Final    Comment: (NOTE) The Xpert Xpress SARS-CoV-2/FLU/RSV plus assay is intended as an aid in the diagnosis of influenza from Nasopharyngeal swab specimens and should not be used as a sole basis for treatment. Nasal washings and aspirates are unacceptable for Xpert Xpress SARS-CoV-2/FLU/RSV testing.  Fact Sheet for Patients: BloggerCourse.com  Fact Sheet for Healthcare Providers: SeriousBroker.it  This test is not yet approved or cleared by the [place]  FDA and has been authorized for detection and/or diagnosis of SARS-CoV-2 by FDA under an Emergency Use Authorization (EUA). This EUA will remain in effect (meaning this test can be used) for the duration of the COVID-19 declaration under Section 564(b)(1) of the Act,  21 U.S.C. section 360bbb-3(b)(1), unless the authorization is terminated or revoked.     Resp Syncytial Virus by PCR NEGATIVE NEGATIVE Final    Comment: (NOTE) Fact Sheet for Patients: BloggerCourse.com  Fact Sheet for Healthcare Providers: SeriousBroker.it  This test is not yet approved or cleared by the [place]  FDA and has been authorized for detection and/or diagnosis of SARS-CoV-2 by FDA under an Emergency Use Authorization (EUA). This EUA will remain in effect (meaning this test can be used) for the duration of the COVID-19 declaration under Section 564(b)(1) of the Act, 21 U.S.C. section 360bbb-3(b)(1), unless the authorization is terminated or revoked.  Performed at [facility], [address], [place], [place] [address]      Radiological Exams on Admission: CT PELVIS WO CONTRAST Result Date: 01/24/2024 CLINICAL DATA:  58 year old female with seizure in the emergency department, preceding fall. EXAM: CT PELVIS WITHOUT CONTRAST TECHNIQUE: Multidetector CT imaging of the pelvis was performed following the standard protocol without intravenous contrast. RADIATION DOSE REDUCTION: This exam was performed according to the departmental dose-optimization program which includes automated exposure control, adjustment of the mA and/or kV according to patient size and/or use of iterative reconstruction technique. COMPARISON:  Lumbar spine CT today. CT Abdomen and Pelvis last night. FINDINGS: Urinary Tract: Kidneys not included. No hydroureter. Moderate urinary bladder distention with excreted IV contrast. Bowel: Stable. Right abdominal anastomosis redemonstrated. No pneumoperitoneum or free fluid identified. Vascular/Lymphatic: Aortoiliac calcified atherosclerosis. Vascular patency is not evaluated in the absence of IV contrast. No lymphadenopathy identified. Reproductive: Absent uterus. Noncontrast left ovary within normal limits.  Right ovary diminutive or absent. Other:  No pelvis free fluid. Musculoskeletal: Lumbar  spine reported separately this morning. Sacrum and SI joints appear stable and intact with asymmetric sclerotic degeneration on the right. Right total hip arthroplasty redemonstrated, stable. Bilateral pelvis and proximal femurs appear intact. Underlying osteopenia. No acute osseous abnormality identified. No acute superficial soft tissue injury identified. IMPRESSION: 1. Stable from CT Abdomen and Pelvis yesterday. No acute traumatic injury identified in the Pelvis. 2. Lumbar spine reported separately. Electronically Signed   By: VEAR Hurst M.D.   On: 01/24/2024 04:23   CT LUMBAR SPINE WO CONTRAST Result Date: 01/24/2024 CLINICAL DATA:  58 year old female with seizure in the emergency department, preceding fall. EXAM: CT LUMBAR SPINE WITHOUT CONTRAST TECHNIQUE: Multidetector CT imaging of the lumbar spine was performed without intravenous contrast administration. Multiplanar CT image reconstructions were also generated. RADIATION DOSE REDUCTION: This exam was performed according to the departmental dose-optimization program which includes automated exposure control, adjustment of the mA and/or kV according to patient size and/or use of iterative reconstruction technique. COMPARISON:  CT Abdomen and Pelvis last night. FINDINGS: Segmentation: Normal. Alignment: Stable and normal lumbar lordosis. Vertebrae: Osteopenia. Stable, maintained lumbar vertebral height. Chronic right posterior 11th and 12th rib fractures are stable. Lumbar vertebrae appear intact. Visible sacrum and SI joints appear intact, asymmetric right SI joint degeneration. Paraspinal and other soft tissues: Stable from last night. Calcified aortic atherosclerosis. Negative lumbar paraspinal soft tissues. Disc levels: Lumbar spine degeneration primarily consisting of facet arthropathy, moderate to severe at L4-L5 and L5-S1. No evidence of lumbar disc herniation or  spinal stenosis. IMPRESSION: 1. No acute traumatic injury identified in the Lumbar Spine. 2. Stable from CT Abdomen and Pelvis last night. 3.  Aortic Atherosclerosis (ICD10-I70.0). Electronically Signed   By: VEAR Hurst M.D.   On: 01/24/2024 04:20   CT HEAD WO CONTRAST ( ) Result Date: 01/24/2024 CLINICAL DATA:  58 year old female with seizure in the emergency department, preceding fall. EXAM: CT HEAD WITHOUT CONTRAST TECHNIQUE: Contiguous axial images were obtained from the base of the skull through the vertex without intravenous contrast. RADIATION DOSE REDUCTION: This exam was performed according to the departmental dose-optimization program which includes automated exposure control, adjustment of the mA and/or kV according to patient size and/or use of iterative reconstruction technique. COMPARISON:  Brain MRI 08/24/2022.  Head CT 10/29/2023. FINDINGS: Brain: Cerebral volume remains normal for age. No midline shift, ventriculomegaly, mass effect, evidence of mass lesion, intracranial hemorrhage or evidence of cortically based acute infarction. Gray-white matter differentiation is within normal limits throughout the brain. Vascular: No suspicious intracranial vascular hyperdensity. Calcified atherosclerosis at the skull base. Skull: Stable and intact. Sinuses/Orbits: Visualized paranasal sinuses and mastoids are stable and well aerated. Other: No acute orbit or scalp soft tissue injury identified. IMPRESSION: No acute traumatic injury identified. Stable and normal for age noncontrast CT appearance of the brain. Electronically Signed   By: VEAR Hurst M.D.   On: 01/24/2024 04:16   CT ABDOMEN PELVIS W CONTRAST Result Date: 01/23/2024 EXAM: CTA CHEST PE WITHOUT AND WITH CONTRAST CT ABDOMEN AND PELVIS WITHOUT AND WITH CONTRAST 01/23/2024 08:39:45 PM TECHNIQUE: CTA of the chest was performed after the administration of intravenous contrast. Multiplanar reformatted images are provided for review. MIP images are provided  for review. CT of the abdomen and pelvis was performed with the administration of intravenous contrast. Automated exposure control, iterative reconstruction, and/or weight based adjustment of the mA/kV was utilized to reduce the radiation dose to as low as reasonably achievable. COMPARISON: Same-day chest radiograph. CTA chest December 02, 2023. CT abdomen  and pelvis December 17, 2023. CLINICAL HISTORY: Pulmonary embolism (PE) suspected, high prob. Nausea, vomiting, non-localized abdominal pain. FINDINGS: CHEST: PULMONARY ARTERIES: Pulmonary arteries are adequately opacified for evaluation. No intraluminal filling defect to suggest pulmonary embolism. Dilated main pulmonary artery measuring 33 mm which can be seen with pulmonary hypertension. MEDIASTINUM: No mediastinal lymphadenopathy. The heart and pericardium demonstrate no acute abnormality. Normal caliber thoracic aorta without dissection. Aortic and coronary artery atherosclerotic calcifications. LUNGS AND PLEURA: Emphysema. No focal consolidation, pleural effusion, or pneumothorax. SOFT TISSUES AND BONES: Chronic compression fractures of T1, T4, T6, and T9. No acute fracture. ABDOMEN AND PELVIS: LIVER: Hepatic steatosis. GALLBLADDER AND BILE DUCTS: Gallbladder is unremarkable. No biliary ductal dilatation. SPLEEN: Spleen demonstrates no acute abnormality. PANCREAS: Pancreas demonstrates no acute abnormality. ADRENAL GLANDS: Adrenal glands demonstrate no acute abnormality. KIDNEYS, URETERS AND BLADDER: No stones in the kidneys or ureters. No hydronephrosis. No perinephric or periureteral stranding. Urinary bladder is partially obscured by streak artifact. GI AND BOWEL: Postoperative change of gastric bypass. Diffuse esophageal wall thickening. Correlate for esophagitis. There is no bowel obstruction. No abnormal bowel wall thickening or distension. REPRODUCTIVE: Reproductive organs are unremarkable. PERITONEUM AND RETROPERITONEUM: No ascites or free air. LYMPH NODES:  No lymphadenopathy. BONES AND SOFT TISSUES: Right hip arthroplasty. No focal soft tissue abnormality. IMPRESSION: 1. No pulmonary embolism. 2. Dilated main pulmonary artery measuring 33 mm, consistent with pulmonary hypertension. 3. Emphysema. 4. Diffuse esophageal wall thickening, correlate for esophagitis. 5. Hepatic steatosis. Electronically signed by: Norman Gatlin MD 01/23/2024 09:04 PM EDT RP Workstation: [id number]   CT Angio Chest PE W and/or Wo Contrast Result Date: 01/23/2024 EXAM: CTA CHEST PE WITHOUT AND WITH CONTRAST CT ABDOMEN AND PELVIS WITHOUT AND WITH CONTRAST 01/23/2024 08:39:45 PM TECHNIQUE: CTA of the chest was performed after the administration of intravenous contrast. Multiplanar reformatted images are provided for review. MIP images are provided for review. CT of the abdomen and pelvis was performed with the administration of intravenous contrast. Automated exposure control, iterative reconstruction, and/or weight based adjustment of the mA/kV was utilized to reduce the radiation dose to as low as reasonably achievable. COMPARISON: Same-day chest radiograph. CTA chest December 02, 2023. CT abdomen and pelvis December 17, 2023. CLINICAL HISTORY: Pulmonary embolism (PE) suspected, high prob. Nausea, vomiting, non-localized abdominal pain. FINDINGS: CHEST: PULMONARY ARTERIES: Pulmonary arteries are adequately opacified for evaluation. No intraluminal filling defect to suggest pulmonary embolism. Dilated main pulmonary artery measuring 33 mm which can be seen with pulmonary hypertension. MEDIASTINUM: No mediastinal lymphadenopathy. The heart and pericardium demonstrate no acute abnormality. Normal caliber thoracic aorta without dissection. Aortic and coronary artery atherosclerotic calcifications. LUNGS AND PLEURA: Emphysema. No focal consolidation, pleural effusion, or pneumothorax. SOFT TISSUES AND BONES: Chronic compression fractures of T1, T4, T6, and T9. No acute fracture. ABDOMEN AND PELVIS:  LIVER: Hepatic steatosis. GALLBLADDER AND BILE DUCTS: Gallbladder is unremarkable. No biliary ductal dilatation. SPLEEN: Spleen demonstrates no acute abnormality. PANCREAS: Pancreas demonstrates no acute abnormality. ADRENAL GLANDS: Adrenal glands demonstrate no acute abnormality. KIDNEYS, URETERS AND BLADDER: No stones in the kidneys or ureters. No hydronephrosis. No perinephric or periureteral stranding. Urinary bladder is partially obscured by streak artifact. GI AND BOWEL: Postoperative change of gastric bypass. Diffuse esophageal wall thickening. Correlate for esophagitis. There is no bowel obstruction. No abnormal bowel wall thickening or distension. REPRODUCTIVE: Reproductive organs are unremarkable. PERITONEUM AND RETROPERITONEUM: No ascites or free air. LYMPH NODES: No lymphadenopathy. BONES AND SOFT TISSUES: Right hip arthroplasty. No focal soft tissue abnormality. IMPRESSION: 1. No pulmonary embolism.  2. Dilated main pulmonary artery measuring 33 mm, consistent with pulmonary hypertension. 3. Emphysema. 4. Diffuse esophageal wall thickening, correlate for esophagitis. 5. Hepatic steatosis. Electronically signed by: Norman Gatlin MD 01/23/2024 09:04 PM EDT RP Workstation: [id number]   DG Chest Portable 1 View Result Date: 01/23/2024 EXAM: 1 VIEW XRAY OF THE CHEST 01/23/2024 08:02:37 PM COMPARISON: Radiograph 3125. CLINICAL HISTORY: SOB. C/O dyspnea with mild wheezing. FINDINGS: LUNGS AND PLEURA: Left basilar atelectasis. Otherwise no focal pulmonary opacity. No pulmonary edema. No pleural effusion. No pneumothorax. HEART AND MEDIASTINUM: No acute abnormality of the cardiac and mediastinal silhouettes. BONES AND SOFT TISSUES: No acute osseous abnormality. IMPRESSION: 1. No acute process. Electronically signed by: Norman Gatlin MD 01/23/2024 08:10 PM EDT RP Workstation: [id number]    EKG: Independently reviewed.  Sinus tachycardia.  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  Patient was given 2 mg IV Ativan .  I discussed with Dr. Michaela neurologist who also advised to give loading dose of Keppra  1500 mg and continue home dose of Keppra  and zonisamide .  Closely monitor. COPD with mild exacerbation on DuoNebs and Pulmicort . Diabetes mellitus type 2 last hemoglobin A1c was 6.52 months ago.  Usually diet controlled.  On sliding scale coverage. Alcohol  abuse on CIWA protocol.  IV Ativan  thiamine .  Once patient is more alert awake will need counseling.  Likely contributing to #1. Anemia appears to be chronic.  Follow CBC. History of depression.  To confirm home medications. History of polysubstance abuse drug screen pending.   Lactic acidosis likely from seizures.  No signs of infection.  Hydrate and recheck lactic acid.  Since patient has breakthrough seizures with COPD exacerbation will need close monitoring and more than 2 midnight stay.   DVT prophylaxis: Lovenox . Code Status: Full code. Family Communication: No family at the bedside. Disposition Plan: Progressive care. Consults called: Discussed with neurologist.  Social work consult. Admission status: Observation.

## 2024-01-24 NOTE — ED Notes (Signed)
 Report given Jessica, RN on 2C.

## 2024-02-01 ENCOUNTER — Encounter (HOSPITAL_COMMUNITY): Payer: Self-pay | Admitting: Emergency Medicine

## 2024-02-01 ENCOUNTER — Emergency Department (HOSPITAL_COMMUNITY)

## 2024-02-01 ENCOUNTER — Emergency Department (HOSPITAL_COMMUNITY)
Admission: EM | Admit: 2024-02-01 | Discharge: 2024-02-01 | Disposition: A | Discharge status: Home / Self Care | Attending: Emergency Medicine | Admitting: Emergency Medicine

## 2024-02-01 DIAGNOSIS — J449 Chronic obstructive pulmonary disease, unspecified: Secondary | ICD-10-CM | POA: Insufficient documentation

## 2024-02-01 DIAGNOSIS — R0602 Shortness of breath: Secondary | ICD-10-CM | POA: Insufficient documentation

## 2024-02-01 LAB — CBC WITH DIFFERENTIAL/PLATELET
Abs Immature Granulocytes: 0.01 K/uL (ref 0.00–0.07)
Basophils Absolute: 0 K/uL (ref 0.0–0.1)
Basophils Relative: 0 %
Eosinophils Absolute: 0 K/uL (ref 0.0–0.5)
Eosinophils Relative: 1 %
HCT: 32.5 % — ABNORMAL LOW (ref 36.0–46.0)
Hemoglobin: 9.7 g/dL — ABNORMAL LOW (ref 12.0–15.0)
Immature Granulocytes: 0 %
Lymphocytes Relative: 50 %
Lymphs Abs: 2.2 K/uL (ref 0.7–4.0)
MCH: 22.3 pg — ABNORMAL LOW (ref 26.0–34.0)
MCHC: 29.8 g/dL — ABNORMAL LOW (ref 30.0–36.0)
MCV: 74.7 fL — ABNORMAL LOW (ref 80.0–100.0)
Monocytes Absolute: 0.4 K/uL (ref 0.1–1.0)
Monocytes Relative: 9 %
Neutro Abs: 1.8 K/uL (ref 1.7–7.7)
Neutrophils Relative %: 40 %
Platelets: 195 K/uL (ref 150–400)
RBC: 4.35 MIL/uL (ref 3.87–5.11)
RDW: 21 % — ABNORMAL HIGH (ref 11.5–15.5)
WBC: 4.5 K/uL (ref 4.0–10.5)
nRBC: 0 % (ref 0.0–0.2)

## 2024-02-01 LAB — BASIC METABOLIC PANEL WITH GFR
Anion gap: 14 (ref 5–15)
BUN: <5 mg/dL — ABNORMAL LOW (ref 6–20)
CO2: 19 mmol/L — ABNORMAL LOW (ref 22–32)
Calcium: 8.3 mg/dL — ABNORMAL LOW (ref 8.9–10.3)
Chloride: 106 mmol/L (ref 98–111)
Creatinine, Ser: 0.51 mg/dL (ref 0.44–1.00)
GFR, Estimated: >60 mL/min (ref >60)
Glucose, Bld: 194 mg/dL — ABNORMAL HIGH (ref 70–99)
Potassium: 4.4 mmol/L (ref 3.5–5.1)
Sodium: 139 mmol/L (ref 135–145)

## 2024-02-01 MED ORDER — PREDNISONE 20 MG PO TABS: ORAL_TABLET | ORAL | 0 refills | Status: DC

## 2024-02-01 MED ORDER — PREDNISONE 20 MG PO TABS
60.0000 mg | ORAL_TABLET | Freq: Once | ORAL | Status: AC
  Administered 2024-02-01 (×2): 60 mg via ORAL
  Filled 2024-02-01: qty 3

## 2024-02-01 MED ORDER — CHLORDIAZEPOXIDE HCL 25 MG PO CAPS
100.0000 mg | ORAL_CAPSULE | Freq: Once | ORAL | Status: AC
  Administered 2024-02-01 (×2): 100 mg via ORAL
  Filled 2024-02-01: qty 4

## 2024-02-01 MED ORDER — IPRATROPIUM-ALBUTEROL 0.5-2.5 (3) MG/3ML IN SOLN
3.0000 mL | Freq: Once | RESPIRATORY_TRACT | Status: AC
  Administered 2024-02-01 (×2): 3 mL via RESPIRATORY_TRACT
  Filled 2024-02-01: qty 3

## 2024-02-01 MED ORDER — ALBUTEROL SULFATE HFA 108 (90 BASE) MCG/ACT IN AERS
2.0000 | INHALATION_SPRAY | Freq: Once | RESPIRATORY_TRACT | Status: AC
  Administered 2024-02-01 (×2): 2 via RESPIRATORY_TRACT
  Filled 2024-02-01: qty 6.7

## 2024-02-01 NOTE — ED Notes (Signed)
 [name] pt's POA stated she was sending an [employer] for pt transportation.

## 2024-02-01 NOTE — Discharge Instructions (Signed)
 Please have your family doctor and pulmonologist in the office.  Please return for worsening difficulty breathing.

## 2024-02-01 NOTE — ED Notes (Signed)
 ED Provider at bedside.

## 2024-02-01 NOTE — ED Provider Notes (Signed)
 [place] [facility] EMERGENCY DEPARTMENT AT [facility] Provider Note   CSN: [id number] Arrival date & time: 02/01/24  0145     Patient presents with: Shortness of Breath   [name] is a 58 y.o. female.   58 yo F with a chief complaints of difficulty breathing.  She said she was sitting on the couch and woke up on the ground and was having trouble breathing.  Before she went to sleep she felt like she was breathing okay.  She thinks maybe she had a seizure because her right wrist hurts a little bit.  She has been drinking today.  Had a couple 40 ounce drinks.  She typically wears oxygen  at baseline but did not have her oxygen  on when she was sitting on the couch.  She feels better after a breathing treatment with EMS and being placed back on her home oxygen .   Shortness of Breath      Prior to Admission medications   Medication Sig Start Date End Date Taking? Authorizing Provider  predniSONE  (DELTASONE ) 20 MG tablet 2 tabs po daily x 4 days 02/01/24  Yes Emil Share, DO  albuterol  (VENTOLIN  HFA) 108 (90 Base) MCG/ACT inhaler Inhale 2 puffs into the lungs every 4 (four) hours as needed for wheezing or shortness of breath. 11/30/23   Steinl, Kevin, MD  ARIPiprazole  (ABILIFY ) 10 MG tablet Take 1 tablet (10 mg total) by mouth daily. Patient not taking: Reported on 01/24/2024 11/01/23   Samtani, Jai-Gurmukh, MD  divalproex  (DEPAKOTE  SPRINKLE) 125 MG capsule Take 2 capsules (250 mg total) by mouth every 12 (twelve) hours. Patient not taking: Reported on 01/24/2024 11/14/23   Raenelle Donalda HERO, MD  fluticasone  furoate-vilanterol (BREO ELLIPTA ) 100-25 MCG/ACT AEPB Inhale 1 puff into the lungs daily. 11/14/23   Ghimire, Donalda HERO, MD  folic acid  (FOLVITE ) 1 MG tablet Take 1 tablet (1 mg total) by mouth daily. Patient not taking: Reported on 01/23/2024 12/13/23   Singh, Prashant K, MD  ipratropium-albuterol  (DUONEB) 0.5-2.5 (3) MG/3ML SOLN Take 3 mLs by nebulization every 6 (six) hours as needed  (for shortness of breath or wheezing). Patient not taking: Reported on 01/23/2024    [provider]  levETIRAcetam  (KEPPRA  XR) 500 MG 24 hr tablet Take 1 tablet (500 mg total) by mouth 2 (two) times daily. Patient not taking: Reported on 01/24/2024 10/31/23   Samtani, Jai-Gurmukh, MD  metFORMIN  (GLUCOPHAGE -XR) 750 MG 24 hr tablet Take 1 tablet (750 mg total) by mouth daily with breakfast. 12/13/23   Singh, Prashant K, MD  metoprolol  tartrate (LOPRESSOR ) 25 MG tablet Take 1 tablet (25 mg total) by mouth 2 (two) times daily. 12/13/23   Singh, Prashant K, MD  neomycin -bacitracin -polymyxin [phone] OINT Apply 1 Application topically 3 (three) times daily. Apply to your right nipple 3 times a day for 2 weeks 12/13/23   Singh, Prashant K, MD  nicotine  (NICODERM CQ  - DOSED IN MG/24 HOURS) 21 mg/24hr patch Place 1 patch (21 mg total) onto the skin daily. 12/13/23   Singh, Prashant K, MD  OXYGEN  Inhale 4-5 L/min into the lungs daily as needed (for COPD-related symptoms).    [provider]  pantoprazole  (PROTONIX ) 40 MG tablet Take 1 tablet (40 mg total) by mouth daily. Patient not taking: Reported on 01/23/2024 12/13/23   Singh, Prashant K, MD  thiamine  (VITAMIN B-1) 100 MG tablet Take 1 tablet (100 mg total) by mouth daily. 11/14/23   Ghimire, Donalda HERO, MD  umeclidinium bromide  (INCRUSE ELLIPTA ) 62.5  MCG/ACT AEPB Inhale 1 puff into the lungs daily. 11/14/23   Ghimire, Donalda HERO, MD  zonisamide  (ZONEGRAN ) 100 MG capsule Take 2 capsules (200 mg total) by mouth daily. 10/31/23   Samtani, Jai-Gurmukh, MD    Allergies: Iron  dextran, Aspirin , and Penicillins    Review of Systems  Respiratory:  Positive for shortness of breath.     Updated Vital Signs BP 128/69   Pulse (!) 109   Temp 98.1 F (36.7 C)   Resp 15   LMP 01/08/2011   SpO2 100%   Physical Exam Vitals and nursing note reviewed.  Constitutional:      General: She is not in acute distress.    Appearance: She is well-developed.  She is not diaphoretic.  HENT:     Head: Normocephalic and atraumatic.  Eyes:     Pupils: Pupils are equal, round, and reactive to light.  Cardiovascular:     Rate and Rhythm: Normal rate and regular rhythm.     Heart sounds: No murmur heard.    No friction rub. No gallop.  Pulmonary:     Effort: Pulmonary effort is normal.     Breath sounds: Decreased breath sounds present. No wheezing or rales.     Comments: Minimally diminished breath sounds in all fields.  No obvious wheezes. Abdominal:     General: There is no distension.     Palpations: Abdomen is soft.     Tenderness: There is no abdominal tenderness.  Musculoskeletal:        General: No tenderness.     Cervical back: Normal range of motion and neck supple.  Skin:    General: Skin is warm and dry.  Neurological:     Mental Status: She is alert and oriented to person, place, and time.  Psychiatric:        Behavior: Behavior normal.     (all labs ordered are listed, but only abnormal results are displayed) Labs Reviewed  CBC WITH DIFFERENTIAL/PLATELET - Abnormal; Notable for the following components:      Result Value   Hemoglobin 9.7 (*)    HCT 32.5 (*)    MCV 74.7 (*)    MCH 22.3 (*)    MCHC 29.8 (*)    RDW 21.0 (*)    All other components within normal limits  BASIC METABOLIC PANEL WITH GFR - Abnormal; Notable for the following components:   CO2 19 (*)    Glucose, Bld 194 (*)    BUN <5 (*)    Calcium  8.3 (*)    All other components within normal limits    EKG: EKG Interpretation Date/Time:  Monday February 01 2024 01:53:15 EDT Ventricular Rate:  110 PR Interval:  130 QRS Duration:  96 QT Interval:  334 QTC Calculation: 452 R Axis:   20  Text Interpretation: Sinus tachycardia LAE, consider biatrial enlargement Low voltage, precordial leads RSR' in V1 or V2, right VCD or RVH Since last tracing rate faster Otherwise no significant change Confirmed by Emil Share [phone]) on 02/01/2024 2:31:00  AM  Radiology: ARCOLA Wrist Complete Right Result Date: 02/01/2024 CLINICAL DATA:  Right wrist pain EXAM: RIGHT WRIST - COMPLETE 3+ VIEW COMPARISON:  None Available. FINDINGS: There is no evidence of fracture or dislocation. There is no evidence of arthropathy or other focal bone abnormality. Soft tissues are unremarkable. IMPRESSION: Negative. Electronically Signed   By: Dorethia Molt M.D.   On: 02/01/2024 02:51   DG Chest Port 1 View Result Date: 02/01/2024 CLINICAL  DATA:  Dyspnea EXAM: PORTABLE CHEST 1 VIEW COMPARISON:  None Available. FINDINGS: The heart size and mediastinal contours are within normal limits. Both lungs are clear. The visualized skeletal structures are unremarkable. IMPRESSION: No active disease. Electronically Signed   By: Dorethia Molt M.D.   On: 02/01/2024 02:50     Procedures   Medications Ordered in the ED  predniSONE  (DELTASONE ) tablet 60 mg (has no administration in time range)  ipratropium-albuterol  (DUONEB) 0.5-2.5 (3) MG/3ML nebulizer solution 3 mL (3 mLs Nebulization Given 02/01/24 0215)  chlordiazePOXIDE  (LIBRIUM ) capsule 100 mg (100 mg Oral Given 02/01/24 0215)                                    Medical Decision Making Amount and/or Complexity of Data Reviewed Labs: ordered. Radiology: ordered.  Risk Prescription drug management.   58 yo F with a chief complaints of difficulty breathing.  Patient has a history of COPD and is on oxygen  at all times.  She was sitting on her couch and she tell me she thinks she had a seizure and woke up on the ground though when asked she said that she went to sleep on the couch.  She has some bruising to the right wrist but full range of motion without significant discomfort or think unlikely to be broken.  She feels like she is having difficulty breathing.  Had no difficulty prior to this event.  Will give another DuoNeb.  During the event the patient was not on her home oxygen .  No acute anemia, no significant  electrolyte abnormalities.  Chest x-ray independently interpreted by me without focal infiltrate or pneumothorax.  Patient was reassessed and continues to feel better.  Would like to go home.  PCP follow-up.  2:58 AM:  I have discussed the diagnosis/risks/treatment options with the patient.  Evaluation and diagnostic testing in the emergency department does not suggest an emergent condition requiring admission or immediate intervention beyond what has been performed at this time.  They will follow up with PCP, pulm. We also discussed returning to the ED immediately if new or worsening sx occur. We discussed the sx which are most concerning (e.g., sudden worsening pain, fever, inability to tolerate by mouth, worsening difficulty breathing) that necessitate immediate return. Medications administered to the patient during their visit and any new prescriptions provided to the patient are listed below.  Medications given during this visit Medications  predniSONE  (DELTASONE ) tablet 60 mg (has no administration in time range)  ipratropium-albuterol  (DUONEB) 0.5-2.5 (3) MG/3ML nebulizer solution 3 mL (3 mLs Nebulization Given 02/01/24 0215)  chlordiazePOXIDE  (LIBRIUM ) capsule 100 mg (100 mg Oral Given 02/01/24 0215)     The patient appears reasonably screen and/or stabilized for discharge and I doubt any other medical condition or other [facility] requiring further screening, evaluation, or treatment in the ED at this time prior to discharge.       Final diagnoses:  Shortness of breath    ED Discharge Orders          Ordered    predniSONE  (DELTASONE ) 20 MG tablet        02/01/24 0256               Emil Share, DO 02/01/24 [id number]

## 2024-02-01 NOTE — ED Triage Notes (Signed)
 Pt  here from home with c/o sob / some wheezing along with some chest pain , pt received 1 breathing treatment by ems

## 2024-02-02 ENCOUNTER — Ambulatory Visit (HOSPITAL_COMMUNITY)
Admission: EM | Admit: 2024-02-02 | Discharge: 2024-02-02 | Disposition: A | Discharge status: Home / Self Care | Attending: Emergency Medicine | Admitting: Emergency Medicine

## 2024-02-02 ENCOUNTER — Encounter (HOSPITAL_COMMUNITY): Payer: Self-pay | Admitting: *Deleted

## 2024-02-02 ENCOUNTER — Other Ambulatory Visit: Payer: Self-pay

## 2024-02-02 DIAGNOSIS — J441 Chronic obstructive pulmonary disease with (acute) exacerbation: Secondary | ICD-10-CM | POA: Diagnosis not present

## 2024-02-02 MED ORDER — ALBUTEROL SULFATE HFA 108 (90 BASE) MCG/ACT IN AERS
2.0000 | INHALATION_SPRAY | RESPIRATORY_TRACT | 1 refills | Status: DC | PRN
  Filled 2024-02-02: qty 18, 30d supply, fill #0
  Filled 2024-03-04: qty 18, 17d supply, fill #1
  Filled 2024-03-23: qty 18, 25d supply, fill #1

## 2024-02-02 MED ORDER — IPRATROPIUM-ALBUTEROL 0.5-2.5 (3) MG/3ML IN SOLN
3.0000 mL | Freq: Once | RESPIRATORY_TRACT | Status: AC
  Administered 2024-02-02 (×2): 3 mL via RESPIRATORY_TRACT

## 2024-02-02 MED ORDER — IPRATROPIUM-ALBUTEROL 0.5-2.5 (3) MG/3ML IN SOLN
RESPIRATORY_TRACT | Status: AC
  Filled 2024-02-02: qty 3

## 2024-02-02 MED ORDER — PREDNISONE 20 MG PO TABS
40.0000 mg | ORAL_TABLET | Freq: Every day | ORAL | 0 refills | Status: DC
Start: 2024-02-02 — End: 2024-02-14
  Filled 2024-02-02 (×2): qty 8, 4d supply, fill #0

## 2024-02-02 NOTE — ED Notes (Signed)
 Called daughter who is POA and went over AVS, also gave [place] pulmonary information, She will have pt reestablish with PCP and pumlmoary

## 2024-02-02 NOTE — ED Triage Notes (Signed)
 Pt states she is SOB and has been using O2, she is unsure of how many liters but states when she was picked up by EMS put her on 5 liters so she left it on that since she went home.   She would like a nebulizer since she can't breath, she has an albuterol  MDI and states she ran out last night.   She is complaining of right wrist pain which was evaluated at ED last night.   Pt called her daughter and she states that she isn't going to be compliant with any meds. She is asking for an albuterol  MDI since she doesn't have a refill. Daughter states that she has medical POA and durable POA. She wasn't called at discharge so she wasn't aware of needs and follow up needed. Daughter states she has anxiety and calls EMS a lot and is usually fine. Daughter would like called upon discharge.

## 2024-02-02 NOTE — Discharge Instructions (Signed)
 Start taking 2 tablets of prednisone  once daily for 4 days to assist with COPD exacerbation. I have refilled your albuterol  inhaler and also provided you with 1 additional refill of this. Follow-up with your primary care provider for further evaluation and management of your COPD and chronic conditions. Return here as needed. If you develop severe trouble breathing, severe chest pain, or passing out please seek immediate medical treatment in the emergency department.

## 2024-02-02 NOTE — ED Provider Notes (Signed)
 [facility]    CSN: [id number] Arrival date & time: 02/02/24  9048      History   Chief Complaint Chief Complaint  Patient presents with   Shortness of Breath   Medication Refill    HPI [name] is a 58 y.o. female.   Patient presents with shortness of breath that began last night.  Patient was seen in the emergency department last night and was given 2 breathing treatments during this encounter with relief.  Patient states that she was not provided a refill on her albuterol  inhaler and is requesting this.  Patient was also prescribed prednisone , but states that she has not yet picked this up from the pharmacy.  Patient states that she really just wants an albuterol  inhaler today.  Past medical history includes COPD, hypertension, hyperlipidemia, hypothyroidism, A-fib, GERD, schizophrenia.  Patient does have a legal guardian and medical power of attorney, is her daughter.  Clinical staff spoke to daughter on the phone who reported that she was not called prior to patient's discharge from the ER yesterday and therefore she did not know what she was instructed to do at discharge and therefore did not know that she needed to pick up any prednisone  for her.  Daughter states that she will pick up the prednisone  for her today, but the patient really needs a refill of her albuterol  inhaler.  The history is provided by the patient and medical records.  Shortness of Breath Medication Refill   Past Medical History:  Diagnosis Date   Abdominal pain    Accidental drug overdose April 2013   Anxiety    Atrial fibrillation (HCC) 09/29/11   converted spontaneously   Chronic back pain    Chronic knee pain    Chronic nausea    Chronic pain    COPD (chronic obstructive pulmonary disease) (HCC)    Depression    Diabetes mellitus    states her doctor took her off all DM meds in past month   Diabetic neuropathy (HCC)    Dyspnea    with exertion    GERD (gastroesophageal reflux  disease)    Headache(784.0)    migraines    HTN (hypertension)    not on meds since in a year    Hyperlipidemia    Hypothyroidism    not on meds in a while    Mental disorder    Bipolar and schizophrenic   Nausea and vomiting 01/02/2023   Requires supplemental oxygen     as needed per patient    Schizophrenia (HCC)    Schizophrenia, acute (HCC) 11/13/2017   Tobacco abuse     Patient Active Problem List   Diagnosis Date Noted   Seizure (HCC) 01/24/2024   Polysubstance abuse (HCC) 01/24/2024   Lactic acidosis 01/24/2024   Protein-calorie malnutrition, severe 12/08/2023   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) 12/06/2023   History of syphilis 11/09/2023   Type 2 diabetes mellitus with diabetic polyneuropathy, with long-term current use of insulin  (HCC) 05/02/2023   Continuous dependence on cigarette smoking 02/22/2023   Alcohol  use disorder, severe, dependence (HCC) 01/03/2023   Aspiration pneumonia of right lower lobe (HCC) 11/20/2022   Cocaine abuse (HCC) 08/23/2022   Memory loss 04/17/2022   Seizure disorder (HCC) 10/10/2020   Primary insomnia 09/08/2014   Encounter for monitoring opioid maintenance therapy 07/10/2014   Chronic pain 11/27/2012   COPD with acute exacerbation (HCC) 09/29/2011    Past Surgical History:  Procedure Laterality Date  ABDOMINAL HYSTERECTOMY     BLADDER SUSPENSION  03/04/2011   Procedure: [facility] PROCEDURE;  Surgeon: Glendia LABOR MacDiarmid;  Location: [facility];  Service: Urology;  Laterality: N/A;   BOWEL RESECTION N/A 04/18/2022   Procedure: SMALL BOWEL RESECTION;  Surgeon: Vanderbilt Ned, MD;  Location: [facility];  Service: General;  Laterality: N/A;   CYSTOCELE REPAIR  03/04/2011   Procedure: ANTERIOR REPAIR (CYSTOCELE);  Surgeon: Glendia LABOR Elizabeth;  Location: [facility];  Service: Urology;  Laterality: N/A;   CYSTOSCOPY  03/04/2011   Procedure: CYSTOSCOPY;  Surgeon: Glendia LABOR MacDiarmid;  Location: [facility];  Service: Urology;  Laterality: N/A;    ESOPHAGOGASTRODUODENOSCOPY (EGD) WITH PROPOFOL  N/A 05/12/2017   Procedure: ESOPHAGOGASTRODUODENOSCOPY (EGD) WITH PROPOFOL ;  Surgeon: Ethyl Lenis, MD;  Location: [facility];  Service: General;  Laterality: N/A;   GASTRIC ROUX-EN-Y N/A 03/25/2016   Procedure: LAPAROSCOPIC ROUX-EN-Y GASTRIC BYPASS WITH UPPER ENDOSCOPY;  Surgeon: Morene Olives, MD;  Location: [facility];  Service: General;  Laterality: N/A;   KNEE SURGERY     LAPAROSCOPIC ASSISTED VAGINAL HYSTERECTOMY  03/04/2011   Procedure: LAPAROSCOPIC ASSISTED VAGINAL HYSTERECTOMY;  Surgeon: Rosaline LITTIE Cobble, MD;  Location: [facility];  Service: Gynecology;  Laterality: N/A;   LAPAROTOMY N/A 04/18/2022   Procedure: EXPLORATORY LAPAROTOMY;  Surgeon: Vanderbilt Ned, MD;  Location: [facility];  Service: General;  Laterality: N/A;   LAPAROTOMY N/A 04/24/2022   Procedure: BRING BACK EXPLORATORY LAPAROTOMY;  Surgeon: Sebastian Moles, MD;  Location: [facility] OR;  Service: General;  Laterality: N/A;   TOTAL HIP ARTHROPLASTY Right 08/27/2022   Procedure: TOTAL HIP ARTHROPLASTY;  Surgeon: Edna Toribio LABOR, MD;  Location: [facility];  Service: Orthopedics;  Laterality: Right;    OB History     Gravida  3   Para  3   Term      Preterm      AB  0   Living  3      SAB  0   IAB  0   Ectopic  0   Multiple  0   Live Births               Home Medications    Prior to Admission medications   Medication Sig Start Date End Date Taking? Authorizing Provider  albuterol  (VENTOLIN  HFA) 108 (90 Base) MCG/ACT inhaler Inhale 2 puffs into the lungs every 4 (four) hours as needed for wheezing or shortness of breath. 02/02/24   Johnie Flaming A, NP  ARIPiprazole  (ABILIFY ) 10 MG tablet Take 1 tablet (10 mg total) by mouth daily. Patient not taking: Reported on 01/24/2024 11/01/23   Samtani, Jai-Gurmukh, MD  divalproex  (DEPAKOTE  SPRINKLE) 125 MG capsule Take 2 capsules (250 mg total) by mouth every 12 (twelve) hours. Patient not taking: Reported on 01/24/2024  11/14/23   Raenelle Donalda HERO, MD  fluticasone  furoate-vilanterol (BREO ELLIPTA ) 100-25 MCG/ACT AEPB Inhale 1 puff into the lungs daily. 11/14/23   Ghimire, Donalda HERO, MD  folic acid  (FOLVITE ) 1 MG tablet Take 1 tablet (1 mg total) by mouth daily. Patient not taking: Reported on 01/23/2024 12/13/23   Singh, Prashant K, MD  ipratropium-albuterol  (DUONEB) 0.5-2.5 (3) MG/3ML SOLN Take 3 mLs by nebulization every 6 (six) hours as needed (for shortness of breath or wheezing). Patient not taking: Reported on 01/23/2024    [provider]  levETIRAcetam  (KEPPRA  XR) 500 MG 24 hr tablet Take 1 tablet (500 mg total) by mouth 2 (two) times daily. Patient not taking: Reported on 01/24/2024 10/31/23   Samtani,  Jai-Gurmukh, MD  metFORMIN  (GLUCOPHAGE -XR) 750 MG 24 hr tablet Take 1 tablet (750 mg total) by mouth daily with breakfast. 12/13/23   Singh, Prashant K, MD  metoprolol  tartrate (LOPRESSOR ) 25 MG tablet Take 1 tablet (25 mg total) by mouth 2 (two) times daily. 12/13/23   Singh, Prashant K, MD  neomycin -bacitracin -polymyxin [phone] OINT Apply 1 Application topically 3 (three) times daily. Apply to your right nipple 3 times a day for 2 weeks 12/13/23   Singh, Prashant K, MD  nicotine  (NICODERM CQ  - DOSED IN MG/24 HOURS) 21 mg/24hr patch Place 1 patch (21 mg total) onto the skin daily. 12/13/23   Singh, Prashant K, MD  OXYGEN  Inhale 4-5 L/min into the lungs daily as needed (for COPD-related symptoms).    [provider]  pantoprazole  (PROTONIX ) 40 MG tablet Take 1 tablet (40 mg total) by mouth daily. Patient not taking: Reported on 01/23/2024 12/13/23   Singh, Prashant K, MD  predniSONE  (DELTASONE ) 20 MG tablet Take 2 tablets (40 mg total) by mouth daily for 4 days 02/02/24   Johnie Flaming A, NP  thiamine  (VITAMIN B-1) 100 MG tablet Take 1 tablet (100 mg total) by mouth daily. 11/14/23   Ghimire, Donalda HERO, MD  umeclidinium bromide  (INCRUSE ELLIPTA ) 62.5 MCG/ACT AEPB Inhale 1 puff into the lungs daily.  11/14/23   Ghimire, Donalda HERO, MD  zonisamide  (ZONEGRAN ) 100 MG capsule Take 2 capsules (200 mg total) by mouth daily. 10/31/23   Samtani, Jai-Gurmukh, MD    Family History Family History  Problem Relation Age of Onset   Heart attack Father        54s   Diabetes Mother    Heart disease Mother    Hypertension Mother    Heart attack Sister        20   COPD Other    Breast cancer Neg Hx     Social History Social History   Tobacco Use   Smoking status: Every Day    Current packs/day: 1.00    Types: Cigarettes   Smokeless tobacco: Never  Vaping Use   Vaping status: Never Used  Substance Use Topics   Alcohol  use: Yes    Comment: 40 ounces daily   Drug use: Yes    Types: Cocaine, Marijuana, Crack cocaine    Comment: last use- 03/19/2023     Allergies   Iron  dextran, Aspirin , and Penicillins   Review of Systems Review of Systems  Respiratory:  Positive for shortness of breath.    Per HPI  Physical Exam Triage Vital Signs ED Triage Vitals  Encounter Vitals Group     BP 02/02/24 1012 (!) 142/87     Girls Systolic BP Percentile --      Girls Diastolic BP Percentile --      Boys Systolic BP Percentile --      Boys Diastolic BP Percentile --      Pulse Rate 02/02/24 1012 88     Resp 02/02/24 1012 20     Temp 02/02/24 1012 (!) 97.4 F (36.3 C)     Temp Source 02/02/24 1012 Oral     SpO2 02/02/24 1012 100 %     Weight --      Height --      Head Circumference --      Peak Flow --      Pain Score 02/02/24 1011 10     Pain Loc --      Pain Education --  Exclude from Growth Chart --    No data found.  Updated Vital Signs BP (!) 142/87 (BP Location: Right Arm)   Pulse 88   Temp (!) 97.4 F (36.3 C) (Oral)   Resp 20   LMP 01/08/2011   SpO2 100%   Visual Acuity Right Eye Distance:   Left Eye Distance:   Bilateral Distance:    Right Eye Near:   Left Eye Near:    Bilateral Near:     Physical Exam Vitals and nursing note reviewed.   Constitutional:      General: She is awake. She is not in acute distress.    Appearance: Normal appearance. She is well-developed and well-groomed. She is not ill-appearing.  Cardiovascular:     Rate and Rhythm: Normal rate and regular rhythm.  Pulmonary:     Effort: Pulmonary effort is normal.     Breath sounds: Wheezing present.  Skin:    General: Skin is warm and dry.  Neurological:     Mental Status: She is alert.  Psychiatric:        Behavior: Behavior is cooperative.      UC Treatments / Results  Labs (all labs ordered are listed, but only abnormal results are displayed) Labs Reviewed - No data to display  EKG   Radiology DG Wrist Complete Right Result Date: 02/01/2024 CLINICAL DATA:  Right wrist pain EXAM: RIGHT WRIST - COMPLETE 3+ VIEW COMPARISON:  None Available. FINDINGS: There is no evidence of fracture or dislocation. There is no evidence of arthropathy or other focal bone abnormality. Soft tissues are unremarkable. IMPRESSION: Negative. Electronically Signed   By: Dorethia Molt M.D.   On: 02/01/2024 02:51   DG Chest Port 1 View Result Date: 02/01/2024 CLINICAL DATA:  Dyspnea EXAM: PORTABLE CHEST 1 VIEW COMPARISON:  None Available. FINDINGS: The heart size and mediastinal contours are within normal limits. Both lungs are clear. The visualized skeletal structures are unremarkable. IMPRESSION: No active disease. Electronically Signed   By: Dorethia Molt M.D.   On: 02/01/2024 02:50    Procedures Procedures (including critical care time)  Medications Ordered in UC Medications  ipratropium-albuterol  (DUONEB) 0.5-2.5 (3) MG/3ML nebulizer solution 3 mL (3 mLs Nebulization Given 02/02/24 1153)    Initial Impression / Assessment and Plan / UC Course  I have reviewed the triage vital signs and the nursing notes.  Pertinent labs & imaging results that were available during my care of the patient were reviewed by me and considered in my medical decision making (see  chart for details).     Patient is overall well-appearing.  Vitals are stable.  Wheezing auscultated throughout all lung fields.  DuoNeb given with relief wheezing.  Recent previous prescription of prednisone  for COPD exacerbation to a different pharmacy per patient's daughter request.  Refilled albuterol  inhaler and provided 1 additional refill of this.  Discussed importance of following up with primary care provider for further evaluation of COPD.  Clinical staff called and spoke with daughter regarding today's visit.  Discussed follow-up, return, and strict ER precautions. Final Clinical Impressions(s) / UC Diagnoses   Final diagnoses:  COPD exacerbation [facility])     Discharge Instructions      Start taking 2 tablets of prednisone  once daily for 4 days to assist with COPD exacerbation. I have refilled your albuterol  inhaler and also provided you with 1 additional refill of this. Follow-up with your primary care provider for further evaluation and management of your COPD and chronic conditions. Return here  as needed. If you develop severe trouble breathing, severe chest pain, or passing out please seek immediate medical treatment in the emergency department.     ED Prescriptions     Medication Sig Dispense Auth. Provider   albuterol  (VENTOLIN  HFA) 108 (90 Base) MCG/ACT inhaler Inhale 2 puffs into the lungs every 4 (four) hours as needed for wheezing or shortness of breath. 18 g Johnie Flaming A, NP   predniSONE  (DELTASONE ) 20 MG tablet Take 2 tablets (40 mg total) by mouth daily for 4 days 8 tablet Johnie Flaming A, NP      PDMP not reviewed this encounter.   Johnie Flaming A, NP 02/02/24 1227

## 2024-02-03 ENCOUNTER — Other Ambulatory Visit: Payer: Self-pay

## 2024-02-03 ENCOUNTER — Emergency Department (HOSPITAL_COMMUNITY)
Admission: EM | Admit: 2024-02-03 | Discharge: 2024-02-04 | Disposition: A | Discharge status: Home / Self Care | Source: Ambulatory Visit | Attending: Emergency Medicine | Admitting: Emergency Medicine

## 2024-02-03 ENCOUNTER — Encounter (HOSPITAL_COMMUNITY): Payer: Self-pay | Admitting: Emergency Medicine

## 2024-02-03 ENCOUNTER — Emergency Department (HOSPITAL_COMMUNITY)

## 2024-02-03 DIAGNOSIS — F17200 Nicotine dependence, unspecified, uncomplicated: Secondary | ICD-10-CM | POA: Diagnosis not present

## 2024-02-03 DIAGNOSIS — J441 Chronic obstructive pulmonary disease with (acute) exacerbation: Secondary | ICD-10-CM | POA: Diagnosis not present

## 2024-02-03 DIAGNOSIS — Z7951 Long term (current) use of inhaled steroids: Secondary | ICD-10-CM | POA: Diagnosis not present

## 2024-02-03 DIAGNOSIS — R0602 Shortness of breath: Secondary | ICD-10-CM | POA: Diagnosis present

## 2024-02-03 MED ORDER — IPRATROPIUM-ALBUTEROL 0.5-2.5 (3) MG/3ML IN SOLN
3.0000 mL | Freq: Once | RESPIRATORY_TRACT | Status: AC
  Administered 2024-02-03 (×2): 3 mL via RESPIRATORY_TRACT
  Filled 2024-02-03: qty 3

## 2024-02-03 MED ORDER — IPRATROPIUM-ALBUTEROL 0.5-2.5 (3) MG/3ML IN SOLN
3.0000 mL | Freq: Once | RESPIRATORY_TRACT | Status: AC
  Administered 2024-02-03 (×2): 3 mL via RESPIRATORY_TRACT

## 2024-02-03 MED ORDER — DEXAMETHASONE SODIUM PHOSPHATE 10 MG/ML IJ SOLN
10.0000 mg | Freq: Once | INTRAMUSCULAR | Status: AC
  Administered 2024-02-03 (×2): 10 mg via INTRAMUSCULAR
  Filled 2024-02-03: qty 1

## 2024-02-03 MED ORDER — IPRATROPIUM-ALBUTEROL 0.5-2.5 (3) MG/3ML IN SOLN
RESPIRATORY_TRACT | Status: DC
Start: 2024-02-03 — End: 2024-02-04
  Filled 2024-02-03: qty 3

## 2024-02-03 MED ORDER — METHYLPREDNISOLONE SODIUM SUCC 125 MG IJ SOLR: 125.0000 mg | Freq: Once | INTRAMUSCULAR | Status: DC

## 2024-02-03 NOTE — ED Provider Notes (Signed)
 [place] EMERGENCY DEPARTMENT AT [facility] Provider Note   CSN: [id number] Arrival date & time: 02/03/24  2222     Patient presents with: Shortness of Breath   [name] is a 58 y.o. female.  Patient well-known to this department with 30 visits in the past 6 months, mostly for shortness of breath presents to the emergency room via EMS complaining of dyspnea.  She has a history of asthma and smokes daily.  She was wheezing upon arrival and administered a breathing treatment by EMS.  She continues to have wheezing.  She denies chest pain, abdominal pain, nausea, vomiting, extremity swelling.    Shortness of Breath      Prior to Admission medications   Medication Sig Start Date End Date Taking? Authorizing Provider  albuterol  (VENTOLIN  HFA) 108 (90 Base) MCG/ACT inhaler Inhale 2 puffs into the lungs every 4 (four) hours as needed for wheezing or shortness of breath. 02/02/24   Johnie Flaming A, NP  ARIPiprazole  (ABILIFY ) 10 MG tablet Take 1 tablet (10 mg total) by mouth daily. Patient not taking: Reported on 01/24/2024 11/01/23   Samtani, Jai-Gurmukh, MD  divalproex  (DEPAKOTE  SPRINKLE) 125 MG capsule Take 2 capsules (250 mg total) by mouth every 12 (twelve) hours. Patient not taking: Reported on 01/24/2024 11/14/23   Raenelle Donalda HERO, MD  fluticasone  furoate-vilanterol (BREO ELLIPTA ) 100-25 MCG/ACT AEPB Inhale 1 puff into the lungs daily. 11/14/23   Ghimire, Donalda HERO, MD  folic acid  (FOLVITE ) 1 MG tablet Take 1 tablet (1 mg total) by mouth daily. Patient not taking: Reported on 01/23/2024 12/13/23   Singh, Prashant K, MD  ipratropium-albuterol  (DUONEB) 0.5-2.5 (3) MG/3ML SOLN Take 3 mLs by nebulization every 6 (six) hours as needed (for shortness of breath or wheezing). Patient not taking: Reported on 01/23/2024    [provider]  levETIRAcetam  (KEPPRA  XR) 500 MG 24 hr tablet Take 1 tablet (500 mg total) by mouth 2 (two) times daily. Patient not taking: Reported on  01/24/2024 10/31/23   Samtani, Jai-Gurmukh, MD  metFORMIN  (GLUCOPHAGE -XR) 750 MG 24 hr tablet Take 1 tablet (750 mg total) by mouth daily with breakfast. 12/13/23   Singh, Prashant K, MD  metoprolol  tartrate (LOPRESSOR ) 25 MG tablet Take 1 tablet (25 mg total) by mouth 2 (two) times daily. 12/13/23   Singh, Prashant K, MD  neomycin -bacitracin -polymyxin [phone] OINT Apply 1 Application topically 3 (three) times daily. Apply to your right nipple 3 times a day for 2 weeks 12/13/23   Singh, Prashant K, MD  nicotine  (NICODERM CQ  - DOSED IN MG/24 HOURS) 21 mg/24hr patch Place 1 patch (21 mg total) onto the skin daily. 12/13/23   Singh, Prashant K, MD  OXYGEN  Inhale 4-5 L/min into the lungs daily as needed (for COPD-related symptoms).    [provider]  pantoprazole  (PROTONIX ) 40 MG tablet Take 1 tablet (40 mg total) by mouth daily. Patient not taking: Reported on 01/23/2024 12/13/23   Singh, Prashant K, MD  predniSONE  (DELTASONE ) 20 MG tablet Take 2 tablets (40 mg total) by mouth daily for 4 days 02/02/24   Johnie Flaming A, NP  thiamine  (VITAMIN B-1) 100 MG tablet Take 1 tablet (100 mg total) by mouth daily. 11/14/23   Ghimire, Donalda HERO, MD  umeclidinium bromide  (INCRUSE ELLIPTA ) 62.5 MCG/ACT AEPB Inhale 1 puff into the lungs daily. 11/14/23   Ghimire, Donalda HERO, MD  zonisamide  (ZONEGRAN ) 100 MG capsule Take 2 capsules (200 mg total) by mouth daily. 10/31/23   Samtani, Jai-Gurmukh,  MD    Allergies: Iron  dextran, Aspirin , and Penicillins    Review of Systems  Respiratory:  Positive for shortness of breath.     Updated Vital Signs BP 106/65   Pulse 93   Temp 98 F (36.7 C) (Oral)   Resp 20   LMP 01/08/2011   SpO2 99%   Physical Exam Vitals and nursing note reviewed.  Constitutional:      General: She is not in acute distress.    Appearance: She is well-developed.  HENT:     Head: Normocephalic and atraumatic.  Eyes:     Conjunctiva/sclera: Conjunctivae normal.  Cardiovascular:      Rate and Rhythm: Normal rate and regular rhythm.     Heart sounds: No murmur heard. Pulmonary:     Effort: Pulmonary effort is normal. No respiratory distress.     Breath sounds: Wheezing present.  Abdominal:     Palpations: Abdomen is soft.     Tenderness: There is no abdominal tenderness.  Musculoskeletal:        General: No swelling.     Cervical back: Neck supple.  Skin:    General: Skin is warm and dry.     Capillary Refill: Capillary refill takes less than 2 seconds.  Neurological:     Mental Status: She is alert.  Psychiatric:        Mood and Affect: Mood normal.     (all labs ordered are listed, but only abnormal results are displayed) Labs Reviewed - No data to display   EKG: None  Radiology: [facility] Chest Port 1 View Result Date: 02/03/2024 CLINICAL DATA:  Shortness of breath EXAM: PORTABLE CHEST 1 VIEW COMPARISON:  02/01/2024 FINDINGS: Cardiac shadow is within normal limits. Lungs are well aerated bilaterally. No focal infiltrate or effusion is noted. No bony abnormality is seen. IMPRESSION: No active disease. Electronically Signed   By: Oneil Devonshire M.D.   On: 02/03/2024 23:26     Procedures   Medications Ordered in the ED  ipratropium-albuterol  (DUONEB) 0.5-2.5 (3) MG/3ML nebulizer solution 3 mL ( Nebulization Canceled Entry 02/03/24 2324)  dexamethasone  (DECADRON ) injection 10 mg (10 mg Intramuscular Given 02/03/24 2255)  ipratropium-albuterol  (DUONEB) 0.5-2.5 (3) MG/3ML nebulizer solution 3 mL (3 mLs Nebulization Given 02/03/24 2318)                                    Medical Decision Making Amount and/or Complexity of Data Reviewed Labs: ordered. Radiology: ordered.  Risk Prescription drug management.   This patient presents to the ED for concern of shortness of breath, this involves an extensive number of treatment options, and is a complaint that carries with it a high risk of complications and morbidity.  The differential diagnosis includes COPD  exacerbation, pneumonia, PE, ACS, others   Co morbidities / Chronic conditions that complicate the patient evaluation  COPD   Additional history obtained:  Additional history obtained from EMR External records from outside source obtained and reviewed including notes from recent hospital admission   Lab Tests:  I considered ordering new labs to evaluate for shortness of breath but the patient had labs 2 days ago which were grossly unremarkable   Imaging Studies ordered:  I ordered imaging studies including chest x-ray I independently visualized and interpreted imaging which showed no active disease I agree with the radiologist interpretation   Cardiac Monitoring: / EKG:  The patient was maintained on a  cardiac monitor.  I personally viewed and interpreted the cardiac monitored which showed an underlying rhythm of: Sinus tachycardia   Problem List / ED Course / Critical interventions / Medication management   I ordered medication including Decadron , DuoNeb Reevaluation of the patient after these medicines showed that the patient improved   Social Determinants of Health:  Patient is a daily smoker   Test / Admission - Considered:  Patient with presentation consistent with a COPD exacerbation.  She improved significantly after medication administration.  Her room air saturations were in the upper 90% range with no supplemental oxygen .  She was able to ambulate without difficulty.  She denies chest pain, abdominal pain, nausea, vomiting.  At this time she appears stable for discharge home with recommended outpatient follow-up.  Return precautions provided.      Final diagnoses:  COPD exacerbation [facility])    ED Discharge Orders     None          Logan Ubaldo KATHEE DEVONNA 02/04/24 0301    Griselda Norris, MD 02/04/24 [phone]

## 2024-02-03 NOTE — ED Triage Notes (Signed)
 Pt coming in for sob. Pt still smoking and has history of asthma. Pt has wheezing in all fields and given a treatment from ems

## 2024-02-04 NOTE — ED Notes (Signed)
 Pt discharged. Pt given discharge papers and papers explained. Pt in NAD at this time

## 2024-02-04 NOTE — Discharge Instructions (Signed)
 Your presentation this evening is consistent with a COPD exacerbation.  Please resume your home medication regimen.  I recommend following up with your primary care provider for further evaluation of your COPD.  If you develop any life-threatening symptoms return to the emergency department.

## 2024-02-05 ENCOUNTER — Ambulatory Visit (INDEPENDENT_AMBULATORY_CARE_PROVIDER_SITE_OTHER): Admitting: Adult Health

## 2024-02-05 ENCOUNTER — Other Ambulatory Visit (HOSPITAL_COMMUNITY): Payer: Self-pay

## 2024-02-05 VITALS — BP 132/78 | HR 98 | Temp 97.4°F | Resp 20 | Ht 62.0 in | Wt 123.8 lb

## 2024-02-05 DIAGNOSIS — F1020 Alcohol dependence, uncomplicated: Secondary | ICD-10-CM

## 2024-02-05 DIAGNOSIS — B351 Tinea unguium: Secondary | ICD-10-CM

## 2024-02-05 DIAGNOSIS — K219 Gastro-esophageal reflux disease without esophagitis: Secondary | ICD-10-CM

## 2024-02-05 DIAGNOSIS — Z124 Encounter for screening for malignant neoplasm of cervix: Secondary | ICD-10-CM

## 2024-02-05 DIAGNOSIS — Z7689 Persons encountering health services in other specified circumstances: Secondary | ICD-10-CM

## 2024-02-05 DIAGNOSIS — Z13820 Encounter for screening for osteoporosis: Secondary | ICD-10-CM

## 2024-02-05 DIAGNOSIS — G40909 Epilepsy, unspecified, not intractable, without status epilepticus: Secondary | ICD-10-CM | POA: Diagnosis not present

## 2024-02-05 DIAGNOSIS — Z1211 Encounter for screening for malignant neoplasm of colon: Secondary | ICD-10-CM

## 2024-02-05 DIAGNOSIS — F209 Schizophrenia, unspecified: Secondary | ICD-10-CM

## 2024-02-05 DIAGNOSIS — Z1231 Encounter for screening mammogram for malignant neoplasm of breast: Secondary | ICD-10-CM

## 2024-02-05 DIAGNOSIS — E1169 Type 2 diabetes mellitus with other specified complication: Secondary | ICD-10-CM

## 2024-02-05 DIAGNOSIS — I10 Essential (primary) hypertension: Secondary | ICD-10-CM

## 2024-02-05 DIAGNOSIS — J441 Chronic obstructive pulmonary disease with (acute) exacerbation: Secondary | ICD-10-CM | POA: Diagnosis not present

## 2024-02-05 DIAGNOSIS — Z720 Tobacco use: Secondary | ICD-10-CM

## 2024-02-05 DIAGNOSIS — Z1212 Encounter for screening for malignant neoplasm of rectum: Secondary | ICD-10-CM

## 2024-02-05 MED ORDER — METOPROLOL TARTRATE 25 MG PO TABS
25.0000 mg | ORAL_TABLET | Freq: Two times a day (BID) | ORAL | 1 refills | Status: DC
  Filled 2024-02-05: qty 180, 90d supply, fill #0

## 2024-02-05 MED ORDER — FLUTICASONE FUROATE-VILANTEROL 100-25 MCG/ACT IN AEPB
1.0000 | INHALATION_SPRAY | Freq: Every day | RESPIRATORY_TRACT | 3 refills | Status: DC
  Filled 2024-02-05 – 2024-02-12 (×2): qty 60, 30d supply, fill #0

## 2024-02-05 MED ORDER — ARIPIPRAZOLE 10 MG PO TABS
10.0000 mg | ORAL_TABLET | Freq: Every day | ORAL | 1 refills | Status: DC
  Filled 2024-02-05: qty 30, 30d supply, fill #0

## 2024-02-05 MED ORDER — DIVALPROEX SODIUM 125 MG PO CSDR
250.0000 mg | DELAYED_RELEASE_CAPSULE | Freq: Two times a day (BID) | ORAL | 1 refills | Status: DC
  Filled 2024-02-05: qty 30, 8d supply, fill #0

## 2024-02-05 MED ORDER — IPRATROPIUM-ALBUTEROL 0.5-2.5 (3) MG/3ML IN SOLN
3.0000 mL | Freq: Four times a day (QID) | RESPIRATORY_TRACT | 1 refills | Status: DC | PRN
  Filled 2024-02-05: qty 360, 30d supply, fill #0

## 2024-02-05 MED ORDER — METFORMIN HCL ER 750 MG PO TB24
750.0000 mg | ORAL_TABLET | Freq: Every day | ORAL | 1 refills | Status: DC
  Filled 2024-02-05: qty 90, 90d supply, fill #0

## 2024-02-05 MED ORDER — PANTOPRAZOLE SODIUM 40 MG PO TBEC
40.0000 mg | DELAYED_RELEASE_TABLET | Freq: Every day | ORAL | 1 refills | Status: DC
  Filled 2024-02-05: qty 90, 90d supply, fill #0

## 2024-02-05 MED ORDER — NICOTINE 21 MG/24HR TD PT24
21.0000 mg | MEDICATED_PATCH | Freq: Every day | TRANSDERMAL | 1 refills | Status: DC
  Filled 2024-02-05: qty 28, 28d supply, fill #0

## 2024-02-05 MED ORDER — FOLIC ACID 1 MG PO TABS
1.0000 mg | ORAL_TABLET | Freq: Every day | ORAL | 1 refills | Status: DC
  Filled 2024-02-05: qty 90, 90d supply, fill #0

## 2024-02-05 MED ORDER — LEVETIRACETAM ER 500 MG PO TB24
500.0000 mg | ORAL_TABLET | Freq: Two times a day (BID) | ORAL | 1 refills | Status: DC
  Filled 2024-02-05: qty 180, 90d supply, fill #0

## 2024-02-05 MED ORDER — INCRUSE ELLIPTA 62.5 MCG/ACT IN AEPB
1.0000 | INHALATION_SPRAY | Freq: Every day | RESPIRATORY_TRACT | 1 refills | Status: DC
  Filled 2024-02-05: qty 30, 30d supply, fill #0

## 2024-02-05 MED ORDER — ZONISAMIDE 100 MG PO CAPS
200.0000 mg | ORAL_CAPSULE | Freq: Every day | ORAL | 1 refills | Status: DC
  Filled 2024-02-05: qty 90, 45d supply, fill #0

## 2024-02-05 MED ORDER — ALBUTEROL SULFATE HFA 108 (90 BASE) MCG/ACT IN AERS
2.0000 | INHALATION_SPRAY | RESPIRATORY_TRACT | 1 refills | Status: DC | PRN
  Filled 2024-02-05: qty 18, 30d supply, fill #0

## 2024-02-05 NOTE — Progress Notes (Signed)
 [facility]  Provider:  Jereld Serum DNP  Code Status:  Full Code  Goals of Care:     02/03/2024   10:41 PM  Advanced Directives  Does Patient Have a Medical Advance Directive? No     Chief Complaint  Patient presents with   Establish Care    New Patient   Discussed the use of AI scribe software for clinical note transcription with the patient, who gave verbal consent to proceed.  HPI: Patient is a 58 y.o. female seen today to establish care with [facility]. She was accompanied by her daughter.  She has a history of schizophrenia and is currently not taking her prescribed Abilify . She has moderate depression and is not under the care of a psychiatrist. Her daughter is concerned about her medication compliance and mental health management.  She has COPD and uses albuterol  as needed, often more frequently than prescribed, which causes palpitations and anxiety. She was recently treated for a COPD exacerbation in the emergency room on February 03, 2024, and was prescribed prednisone , which she has not been taking consistently. She is on home oxygen  therapy, using 4-5 liters, primarily at night. She smokes a pack of cigarettes a day and has a history of attempting to smoke while on oxygen , leading to safety concerns.  She has type 2 diabetes and is prescribed metformin , which she is not taking regularly. Her last A1c was checked on Nov 05, 2023. She does not monitor her blood glucose levels at home.  She has a history of seizures and is prescribed Keppra  and Zonegran , but she is not compliant with these medications. Her last seizure was reportedly a few days ago. She has an upcoming neurology appointment in October.  She has hypertension and is prescribed metoprolol , which she is not taking regularly.  She has a history of alcohol  use, with her last consumption being the previous night. Her daughter notes cognitive changes since an overdose in July 2023, which required  intubation.  She reports chronic foot pain, particularly in the toes, and experiences itching after showering. She also reports difficulty with bowel control when coughing.       Past Medical History:  Diagnosis Date   Abdominal pain    Accidental drug overdose April 2013   Anxiety    Atrial fibrillation (HCC) 09/29/11   converted spontaneously   Chronic back pain    Chronic knee pain    Chronic nausea    Chronic pain    COPD (chronic obstructive pulmonary disease) (HCC)    Depression    Diabetes mellitus    states her doctor took her off all DM meds in past month   Diabetic neuropathy (HCC)    Dyspnea    with exertion    GERD (gastroesophageal reflux disease)    Headache(784.0)    migraines    HTN (hypertension)    not on meds since in a year    Hyperlipidemia    Hypothyroidism    not on meds in a while    Mental disorder    Bipolar and schizophrenic   Nausea and vomiting 01/02/2023   Requires supplemental oxygen     as needed per patient    Schizophrenia (HCC)    Schizophrenia, acute (HCC) 11/13/2017   Tobacco abuse     Past Surgical History:  Procedure Laterality Date   ABDOMINAL HYSTERECTOMY     BLADDER SUSPENSION  03/04/2011   Procedure: [facility] PROCEDURE;  Surgeon: Glendia LABOR MacDiarmid;  Location: [facility];  Service: Urology;  Laterality: N/A;   BOWEL RESECTION N/A 04/18/2022   Procedure: SMALL BOWEL RESECTION;  Surgeon: Vanderbilt Ned, MD;  Location: [facility];  Service: General;  Laterality: N/A;   CYSTOCELE REPAIR  03/04/2011   Procedure: ANTERIOR REPAIR (CYSTOCELE);  Surgeon: Glendia DELENA Elizabeth;  Location: [facility];  Service: Urology;  Laterality: N/A;   CYSTOSCOPY  03/04/2011   Procedure: CYSTOSCOPY;  Surgeon: Glendia DELENA MacDiarmid;  Location: [facility];  Service: Urology;  Laterality: N/A;   ESOPHAGOGASTRODUODENOSCOPY (EGD) WITH PROPOFOL  N/A 05/12/2017   Procedure: ESOPHAGOGASTRODUODENOSCOPY (EGD) WITH PROPOFOL ;  Surgeon: Ethyl Lenis, MD;  Location: [facility];  Service:  General;  Laterality: N/A;   GASTRIC ROUX-EN-Y N/A 03/25/2016   Procedure: LAPAROSCOPIC ROUX-EN-Y GASTRIC BYPASS WITH UPPER ENDOSCOPY;  Surgeon: Morene Olives, MD;  Location: [facility];  Service: General;  Laterality: N/A;   KNEE SURGERY     LAPAROSCOPIC ASSISTED VAGINAL HYSTERECTOMY  03/04/2011   Procedure: LAPAROSCOPIC ASSISTED VAGINAL HYSTERECTOMY;  Surgeon: Rosaline LITTIE Cobble, MD;  Location: [facility];  Service: Gynecology;  Laterality: N/A;   LAPAROTOMY N/A 04/18/2022   Procedure: EXPLORATORY LAPAROTOMY;  Surgeon: Vanderbilt Ned, MD;  Location: [facility];  Service: General;  Laterality: N/A;   LAPAROTOMY N/A 04/24/2022   Procedure: BRING BACK EXPLORATORY LAPAROTOMY;  Surgeon: Sebastian Moles, MD;  Location: [facility] OR;  Service: General;  Laterality: N/A;   TOTAL HIP ARTHROPLASTY Right 08/27/2022   Procedure: TOTAL HIP ARTHROPLASTY;  Surgeon: Edna Toribio DELENA, MD;  Location: [facility];  Service: Orthopedics;  Laterality: Right;    Allergies  Allergen Reactions   Iron  Dextran Shortness Of Breath and Anxiety   Aspirin  Nausea And Vomiting and Other (See Comments)    Ok to take tylenol  or ibuprofen     Penicillins Other (See Comments)    Unknown reaction from childhood: family would like this to remain as an allergy.  Tolerated cephalosporins and zosyn     Outpatient Encounter Medications as of 02/05/2024  Medication Sig   albuterol  (VENTOLIN  HFA) 108 (90 Base) MCG/ACT inhaler Inhale 2 puffs into the lungs every 4 (four) hours as needed for wheezing or shortness of breath.   ARIPiprazole  (ABILIFY ) 10 MG tablet Take 1 tablet (10 mg total) by mouth daily.   divalproex  (DEPAKOTE  SPRINKLE) 125 MG capsule Take 2 capsules (250 mg total) by mouth every 12 (twelve) hours.   fluticasone  furoate-vilanterol (BREO ELLIPTA ) 100-25 MCG/ACT AEPB Inhale 1 puff into the lungs daily.   folic acid  (FOLVITE ) 1 MG tablet Take 1 tablet (1 mg total) by mouth daily.   ipratropium-albuterol  (DUONEB) 0.5-2.5 (3) MG/3ML SOLN  Take 3 mLs by nebulization every 6 (six) hours as needed (for shortness of breath or wheezing).   levETIRAcetam  (KEPPRA  XR) 500 MG 24 hr tablet Take 1 tablet (500 mg total) by mouth 2 (two) times daily.   metFORMIN  (GLUCOPHAGE -XR) 750 MG 24 hr tablet Take 1 tablet (750 mg total) by mouth daily with breakfast.   metoprolol  tartrate (LOPRESSOR ) 25 MG tablet Take 1 tablet (25 mg total) by mouth 2 (two) times daily.   nicotine  (NICODERM CQ  - DOSED IN MG/24 HOURS) 21 mg/24hr patch Place 1 patch (21 mg total) onto the skin daily.   OXYGEN  Inhale 4-5 L/min into the lungs daily as needed (for COPD-related symptoms).   pantoprazole  (PROTONIX ) 40 MG tablet Take 1 tablet (40 mg total) by mouth daily.   predniSONE  (DELTASONE ) 20 MG tablet Take 2 tablets (40 mg total) by mouth daily for 4 days (Patient not taking: Reported  on 02/05/2024)   thiamine  (VITAMIN B-1) 100 MG tablet Take 1 tablet (100 mg total) by mouth daily. (Patient not taking: Reported on 02/05/2024)   umeclidinium bromide  (INCRUSE ELLIPTA ) 62.5 MCG/ACT AEPB Inhale 1 puff into the lungs daily.   zonisamide  (ZONEGRAN ) 100 MG capsule Take 2 capsules (200 mg total) by mouth daily.   [DISCONTINUED] albuterol  (VENTOLIN  HFA) 108 (90 Base) MCG/ACT inhaler Inhale 2 puffs into the lungs every 4 (four) hours as needed for wheezing or shortness of breath.   [DISCONTINUED] ARIPiprazole  (ABILIFY ) 10 MG tablet Take 1 tablet (10 mg total) by mouth daily. (Patient not taking: Reported on 02/05/2024)   [DISCONTINUED] divalproex  (DEPAKOTE  SPRINKLE) 125 MG capsule Take 2 capsules (250 mg total) by mouth every 12 (twelve) hours. (Patient not taking: Reported on 02/05/2024)   [DISCONTINUED] fluticasone  furoate-vilanterol (BREO ELLIPTA ) 100-25 MCG/ACT AEPB Inhale 1 puff into the lungs daily. (Patient not taking: Reported on 02/05/2024)   [DISCONTINUED] folic acid  (FOLVITE ) 1 MG tablet Take 1 tablet (1 mg total) by mouth daily. (Patient not taking: Reported on 02/05/2024)    [DISCONTINUED] ipratropium-albuterol  (DUONEB) 0.5-2.5 (3) MG/3ML SOLN Take 3 mLs by nebulization every 6 (six) hours as needed (for shortness of breath or wheezing). (Patient not taking: Reported on 02/05/2024)   [DISCONTINUED] levETIRAcetam  (KEPPRA  XR) 500 MG 24 hr tablet Take 1 tablet (500 mg total) by mouth 2 (two) times daily. (Patient not taking: Reported on 02/05/2024)   [DISCONTINUED] metFORMIN  (GLUCOPHAGE -XR) 750 MG 24 hr tablet Take 1 tablet (750 mg total) by mouth daily with breakfast.   [DISCONTINUED] metoprolol  tartrate (LOPRESSOR ) 25 MG tablet Take 1 tablet (25 mg total) by mouth 2 (two) times daily.   [DISCONTINUED] neomycin -bacitracin -polymyxin [phone] OINT Apply 1 Application topically 3 (three) times daily. Apply to your right nipple 3 times a day for 2 weeks   [DISCONTINUED] nicotine  (NICODERM CQ  - DOSED IN MG/24 HOURS) 21 mg/24hr patch Place 1 patch (21 mg total) onto the skin daily.   [DISCONTINUED] pantoprazole  (PROTONIX ) 40 MG tablet Take 1 tablet (40 mg total) by mouth daily. (Patient not taking: Reported on 02/05/2024)   [DISCONTINUED] umeclidinium bromide  (INCRUSE ELLIPTA ) 62.5 MCG/ACT AEPB Inhale 1 puff into the lungs daily. (Patient not taking: Reported on 02/05/2024)   [DISCONTINUED] zonisamide  (ZONEGRAN ) 100 MG capsule Take 2 capsules (200 mg total) by mouth daily. (Patient not taking: Reported on 02/05/2024)   No facility-administered encounter medications on file as of 02/05/2024.    Review of Systems:  Review of Systems  Constitutional:  Negative for appetite change, chills, fatigue and fever.  HENT:  Negative for congestion, hearing loss, rhinorrhea and sore throat.   Eyes: Negative.   Respiratory:  Negative for cough, shortness of breath and wheezing.   Cardiovascular:  Negative for chest pain, palpitations and leg swelling.  Gastrointestinal:  Negative for abdominal pain, constipation, diarrhea, nausea and vomiting.  Genitourinary:  Negative for dysuria.   Musculoskeletal:  Negative for arthralgias, back pain and myalgias.  Skin:  Negative for color change, rash and wound.  Neurological:  Negative for dizziness, weakness and headaches.  Psychiatric/Behavioral:  Negative for behavioral problems. The patient is not nervous/anxious.     Health Maintenance  Topic Date Due   Medicare Annual Wellness (AWV)  Never done   OPHTHALMOLOGY EXAM  Never done   Diabetic kidney evaluation - Urine ACR  Never done   Hepatitis B Vaccines 19-59 Average Risk (1 of 3 - 19+ 3-dose series) Never done   Cervical Cancer Screening (HPV/Pap Cotest)  Never done   Colonoscopy  Never done   Zoster Vaccines- Shingrix (1 of 2) Never done   MAMMOGRAM  08/23/2020   Pneumococcal Vaccine: 50+ Years (1 of 2 - PCV) 02/22/2024 (Originally 05/22/1985)   INFLUENZA VACCINE  02/22/2024 (Originally 01/22/2024)   COVID-19 Vaccine (2 - 2024-25 season) 02/22/2024 (Originally 02/22/2023)   HEMOGLOBIN A1C  05/07/2024   Diabetic kidney evaluation - eGFR measurement  01/31/2025   FOOT EXAM  02/04/2025   DTaP/Tdap/Td (2 - Td or Tdap) 08/24/2032   Hepatitis C Screening  Completed   HIV Screening  Completed   HPV VACCINES  Aged Out   Meningococcal B Vaccine  Aged Out    Physical Exam: Vitals:   02/05/24 1305  BP: 132/78  Pulse: 98  Resp: 20  Temp: (!) 97.4 F (36.3 C)  SpO2: 94%  Weight: 123 lb 12.8 oz (56.2 kg)  Height: 5' 2 (1.575 m)   Body mass index is 22.64 kg/m. Physical Exam Constitutional:      Appearance: Normal appearance.  HENT:     Head: Normocephalic and atraumatic.     Nose: Nose normal.     Mouth/Throat:     Mouth: Mucous membranes are moist.  Eyes:     Conjunctiva/sclera: Conjunctivae normal.  Cardiovascular:     Rate and Rhythm: Normal rate and regular rhythm.  Pulmonary:     Effort: Pulmonary effort is normal.     Breath sounds: Normal breath sounds.  Abdominal:     General: Bowel sounds are normal.     Palpations: Abdomen is soft.   Musculoskeletal:        General: Normal range of motion.     Cervical back: Normal range of motion.  Skin:    General: Skin is warm and dry.     Comments: Brownish toenails  Neurological:     General: No focal deficit present.     Mental Status: She is alert and oriented to person, place, and time.  Psychiatric:        Mood and Affect: Mood normal.        Behavior: Behavior normal.        Thought Content: Thought content normal.        Judgment: Judgment normal.     Labs reviewed: Basic Metabolic Panel: Recent Labs    02/22/23 0806 02/22/23 1755 10/30/23 0650 10/31/23 0432 12/06/23 0400 12/07/23 0337 12/08/23 0343 12/09/23 0500 12/10/23 0405 12/11/23 0349 12/12/23 0427 12/13/23 0430 01/23/24 2315 01/23/24 2324 01/24/24 0823 02/01/24 0211 02/05/24 1403  NA  --    < > 138   < > 136 136 138   < > 136 139 136   < > 134* 135 135 139  --   K  --    < > 3.6   < > 3.5 3.1* 3.6   < > 3.3* 4.3 3.8   < > 3.5 3.6 4.2 4.4  --   CL  --    < > 102   < > 99 101 107   < > 107 108 103   < > 101  --  104 106  --   CO2  --    < > 23   < > 28 28 23    < > 22 24 24    < > 19*  --  19* 19*  --   GLUCOSE  --    < > 117*   < > 166* 154* 150*   < > 307*  150* 153*   < > 270*  --  151* 194*  --   BUN  --    < > 7   < > 9 7 5*   < > 8 8 8    < > 7  --  5* <5*  --   CREATININE  --    < > 0.59   < > 0.38* 0.47 0.36*   < > 0.62 0.47 0.50   < > 0.74  --  0.64 0.51  --   CALCIUM   --    < > 8.5*   < > 8.7* 8.2* 8.3*   < > 8.4* 8.3* 9.3   < > 8.4*  --  8.9 8.3*  --   MG  --    < > 2.1   < > 1.9 2.0 2.0   < > 2.1 2.0 1.9  --   --   --   --   --   --   PHOS  --    < > 3.6   < > 2.9 4.7* 3.5  --   --   --   --   --   --   --   --   --   --   TSH 0.893  --  2.345  --   --   --   --   --   --   --   --   --   --   --   --   --  1.23   < > = values in this interval not displayed.   Liver Function Tests: Recent Labs    01/23/24 1900 01/23/24 2315 01/24/24 0823  AST 28 32 30  ALT 18 18 19   ALKPHOS 88  86 87  BILITOT 0.9 0.5 1.3*  PROT 6.8 6.6 7.2  ALBUMIN  3.5 3.4* 3.5   Recent Labs    11/04/23 0510 11/16/23 0815 01/23/24 1900  LIPASE 50 35 45   Recent Labs    05/20/23 2313 06/24/23 1813 10/30/23 0650  AMMONIA 24 32 29   CBC: Recent Labs    01/24/24 0823 02/01/24 0211 02/05/24 1403  WBC 7.0 4.5 13.4*  NEUTROABS 6.0 1.8 10,238*  HGB 10.1* 9.7* 9.1*  HCT 34.6* 32.5* 31.4*  MCV 74.9* 74.7* 78.9*  PLT 231 195 243   Lipid Panel: Recent Labs    11/05/23 0500 12/03/23 0435  CHOL 141  --   HDL 88  --   LDLCALC 45  --   TRIG 40 26  CHOLHDL 1.6  --    Lab Results  Component Value Date   HGBA1C 6.5 (H) 11/05/2023    Procedures since last visit: DG Chest Port 1 View Result Date: 02/03/2024 CLINICAL DATA:  Shortness of breath EXAM: PORTABLE CHEST 1 VIEW COMPARISON:  02/01/2024 FINDINGS: Cardiac shadow is within normal limits. Lungs are well aerated bilaterally. No focal infiltrate or effusion is noted. No bony abnormality is seen. IMPRESSION: No active disease. Electronically Signed   By: Oneil Devonshire M.D.   On: 02/03/2024 23:26   DG Wrist Complete Right Result Date: 02/01/2024 CLINICAL DATA:  Right wrist pain EXAM: RIGHT WRIST - COMPLETE 3+ VIEW COMPARISON:  None Available. FINDINGS: There is no evidence of fracture or dislocation. There is no evidence of arthropathy or other focal bone abnormality. Soft tissues are unremarkable. IMPRESSION: Negative. Electronically Signed   By: Dorethia Molt M.D.   On: 02/01/2024 02:51   DG Chest [facility] 1 View Result  Date: 02/01/2024 CLINICAL DATA:  Dyspnea EXAM: PORTABLE CHEST 1 VIEW COMPARISON:  None Available. FINDINGS: The heart size and mediastinal contours are within normal limits. Both lungs are clear. The visualized skeletal structures are unremarkable. IMPRESSION: No active disease. Electronically Signed   By: Dorethia Molt M.D.   On: 02/01/2024 02:50   CT PELVIS WO CONTRAST Result Date: 01/24/2024 CLINICAL DATA:  58 year old  female with seizure in the emergency department, preceding fall. EXAM: CT PELVIS WITHOUT CONTRAST TECHNIQUE: Multidetector CT imaging of the pelvis was performed following the standard protocol without intravenous contrast. RADIATION DOSE REDUCTION: This exam was performed according to the departmental dose-optimization program which includes automated exposure control, adjustment of the mA and/or kV according to patient size and/or use of iterative reconstruction technique. COMPARISON:  Lumbar spine CT today. CT Abdomen and Pelvis last night. FINDINGS: Urinary Tract: Kidneys not included. No hydroureter. Moderate urinary bladder distention with excreted IV contrast. Bowel: Stable. Right abdominal anastomosis redemonstrated. No pneumoperitoneum or free fluid identified. Vascular/Lymphatic: Aortoiliac calcified atherosclerosis. Vascular patency is not evaluated in the absence of IV contrast. No lymphadenopathy identified. Reproductive: Absent uterus. Noncontrast left ovary within normal limits. Right ovary diminutive or absent. Other:  No pelvis free fluid. Musculoskeletal: Lumbar spine reported separately this morning. Sacrum and SI joints appear stable and intact with asymmetric sclerotic degeneration on the right. Right total hip arthroplasty redemonstrated, stable. Bilateral pelvis and proximal femurs appear intact. Underlying osteopenia. No acute osseous abnormality identified. No acute superficial soft tissue injury identified. IMPRESSION: 1. Stable from CT Abdomen and Pelvis yesterday. No acute traumatic injury identified in the Pelvis. 2. Lumbar spine reported separately. Electronically Signed   By: VEAR Hurst M.D.   On: 01/24/2024 04:23   CT LUMBAR SPINE WO CONTRAST Result Date: 01/24/2024 CLINICAL DATA:  58 year old female with seizure in the emergency department, preceding fall. EXAM: CT LUMBAR SPINE WITHOUT CONTRAST TECHNIQUE: Multidetector CT imaging of the lumbar spine was performed without intravenous  contrast administration. Multiplanar CT image reconstructions were also generated. RADIATION DOSE REDUCTION: This exam was performed according to the departmental dose-optimization program which includes automated exposure control, adjustment of the mA and/or kV according to patient size and/or use of iterative reconstruction technique. COMPARISON:  CT Abdomen and Pelvis last night. FINDINGS: Segmentation: Normal. Alignment: Stable and normal lumbar lordosis. Vertebrae: Osteopenia. Stable, maintained lumbar vertebral height. Chronic right posterior 11th and 12th rib fractures are stable. Lumbar vertebrae appear intact. Visible sacrum and SI joints appear intact, asymmetric right SI joint degeneration. Paraspinal and other soft tissues: Stable from last night. Calcified aortic atherosclerosis. Negative lumbar paraspinal soft tissues. Disc levels: Lumbar spine degeneration primarily consisting of facet arthropathy, moderate to severe at L4-L5 and L5-S1. No evidence of lumbar disc herniation or spinal stenosis. IMPRESSION: 1. No acute traumatic injury identified in the Lumbar Spine. 2. Stable from CT Abdomen and Pelvis last night. 3.  Aortic Atherosclerosis (ICD10-I70.0). Electronically Signed   By: VEAR Hurst M.D.   On: 01/24/2024 04:20   CT HEAD WO CONTRAST ( ) Result Date: 01/24/2024 CLINICAL DATA:  58 year old female with seizure in the emergency department, preceding fall. EXAM: CT HEAD WITHOUT CONTRAST TECHNIQUE: Contiguous axial images were obtained from the base of the skull through the vertex without intravenous contrast. RADIATION DOSE REDUCTION: This exam was performed according to the departmental dose-optimization program which includes automated exposure control, adjustment of the mA and/or kV according to patient size and/or use of iterative reconstruction technique. COMPARISON:  Brain MRI 08/24/2022.  Head CT  10/29/2023. FINDINGS: Brain: Cerebral volume remains normal for age. No midline shift,  ventriculomegaly, mass effect, evidence of mass lesion, intracranial hemorrhage or evidence of cortically based acute infarction. Gray-white matter differentiation is within normal limits throughout the brain. Vascular: No suspicious intracranial vascular hyperdensity. Calcified atherosclerosis at the skull base. Skull: Stable and intact. Sinuses/Orbits: Visualized paranasal sinuses and mastoids are stable and well aerated. Other: No acute orbit or scalp soft tissue injury identified. IMPRESSION: No acute traumatic injury identified. Stable and normal for age noncontrast CT appearance of the brain. Electronically Signed   By: VEAR Hurst M.D.   On: 01/24/2024 04:16   CT ABDOMEN PELVIS W CONTRAST Result Date: 01/23/2024 EXAM: CTA CHEST PE WITHOUT AND WITH CONTRAST CT ABDOMEN AND PELVIS WITHOUT AND WITH CONTRAST 01/23/2024 08:39:45 PM TECHNIQUE: CTA of the chest was performed after the administration of intravenous contrast. Multiplanar reformatted images are provided for review. MIP images are provided for review. CT of the abdomen and pelvis was performed with the administration of intravenous contrast. Automated exposure control, iterative reconstruction, and/or weight based adjustment of the mA/kV was utilized to reduce the radiation dose to as low as reasonably achievable. COMPARISON: Same-day chest radiograph. CTA chest December 02, 2023. CT abdomen and pelvis December 17, 2023. CLINICAL HISTORY: Pulmonary embolism (PE) suspected, high prob. Nausea, vomiting, non-localized abdominal pain. FINDINGS: CHEST: PULMONARY ARTERIES: Pulmonary arteries are adequately opacified for evaluation. No intraluminal filling defect to suggest pulmonary embolism. Dilated main pulmonary artery measuring 33 mm which can be seen with pulmonary hypertension. MEDIASTINUM: No mediastinal lymphadenopathy. The heart and pericardium demonstrate no acute abnormality. Normal caliber thoracic aorta without dissection. Aortic and coronary artery  atherosclerotic calcifications. LUNGS AND PLEURA: Emphysema. No focal consolidation, pleural effusion, or pneumothorax. SOFT TISSUES AND BONES: Chronic compression fractures of T1, T4, T6, and T9. No acute fracture. ABDOMEN AND PELVIS: LIVER: Hepatic steatosis. GALLBLADDER AND BILE DUCTS: Gallbladder is unremarkable. No biliary ductal dilatation. SPLEEN: Spleen demonstrates no acute abnormality. PANCREAS: Pancreas demonstrates no acute abnormality. ADRENAL GLANDS: Adrenal glands demonstrate no acute abnormality. KIDNEYS, URETERS AND BLADDER: No stones in the kidneys or ureters. No hydronephrosis. No perinephric or periureteral stranding. Urinary bladder is partially obscured by streak artifact. GI AND BOWEL: Postoperative change of gastric bypass. Diffuse esophageal wall thickening. Correlate for esophagitis. There is no bowel obstruction. No abnormal bowel wall thickening or distension. REPRODUCTIVE: Reproductive organs are unremarkable. PERITONEUM AND RETROPERITONEUM: No ascites or free air. LYMPH NODES: No lymphadenopathy. BONES AND SOFT TISSUES: Right hip arthroplasty. No focal soft tissue abnormality. IMPRESSION: 1. No pulmonary embolism. 2. Dilated main pulmonary artery measuring 33 mm, consistent with pulmonary hypertension. 3. Emphysema. 4. Diffuse esophageal wall thickening, correlate for esophagitis. 5. Hepatic steatosis. Electronically signed by: Norman Gatlin MD 01/23/2024 09:04 PM EDT RP Workstation: [id number]   CT Angio Chest PE W and/or Wo Contrast Result Date: 01/23/2024 EXAM: CTA CHEST PE WITHOUT AND WITH CONTRAST CT ABDOMEN AND PELVIS WITHOUT AND WITH CONTRAST 01/23/2024 08:39:45 PM TECHNIQUE: CTA of the chest was performed after the administration of intravenous contrast. Multiplanar reformatted images are provided for review. MIP images are provided for review. CT of the abdomen and pelvis was performed with the administration of intravenous contrast. Automated exposure control, iterative  reconstruction, and/or weight based adjustment of the mA/kV was utilized to reduce the radiation dose to as low as reasonably achievable. COMPARISON: Same-day chest radiograph. CTA chest December 02, 2023. CT abdomen and pelvis December 17, 2023. CLINICAL HISTORY: Pulmonary embolism (PE) suspected, high prob. Nausea,  vomiting, non-localized abdominal pain. FINDINGS: CHEST: PULMONARY ARTERIES: Pulmonary arteries are adequately opacified for evaluation. No intraluminal filling defect to suggest pulmonary embolism. Dilated main pulmonary artery measuring 33 mm which can be seen with pulmonary hypertension. MEDIASTINUM: No mediastinal lymphadenopathy. The heart and pericardium demonstrate no acute abnormality. Normal caliber thoracic aorta without dissection. Aortic and coronary artery atherosclerotic calcifications. LUNGS AND PLEURA: Emphysema. No focal consolidation, pleural effusion, or pneumothorax. SOFT TISSUES AND BONES: Chronic compression fractures of T1, T4, T6, and T9. No acute fracture. ABDOMEN AND PELVIS: LIVER: Hepatic steatosis. GALLBLADDER AND BILE DUCTS: Gallbladder is unremarkable. No biliary ductal dilatation. SPLEEN: Spleen demonstrates no acute abnormality. PANCREAS: Pancreas demonstrates no acute abnormality. ADRENAL GLANDS: Adrenal glands demonstrate no acute abnormality. KIDNEYS, URETERS AND BLADDER: No stones in the kidneys or ureters. No hydronephrosis. No perinephric or periureteral stranding. Urinary bladder is partially obscured by streak artifact. GI AND BOWEL: Postoperative change of gastric bypass. Diffuse esophageal wall thickening. Correlate for esophagitis. There is no bowel obstruction. No abnormal bowel wall thickening or distension. REPRODUCTIVE: Reproductive organs are unremarkable. PERITONEUM AND RETROPERITONEUM: No ascites or free air. LYMPH NODES: No lymphadenopathy. BONES AND SOFT TISSUES: Right hip arthroplasty. No focal soft tissue abnormality. IMPRESSION: 1. No pulmonary embolism. 2.  Dilated main pulmonary artery measuring 33 mm, consistent with pulmonary hypertension. 3. Emphysema. 4. Diffuse esophageal wall thickening, correlate for esophagitis. 5. Hepatic steatosis. Electronically signed by: Norman Gatlin MD 01/23/2024 09:04 PM EDT RP Workstation: [id number]   DG Chest Portable 1 View Result Date: 01/23/2024 EXAM: 1 VIEW XRAY OF THE CHEST 01/23/2024 08:02:37 PM COMPARISON: Radiograph 3125. CLINICAL HISTORY: SOB. C/O dyspnea with mild wheezing. FINDINGS: LUNGS AND PLEURA: Left basilar atelectasis. Otherwise no focal pulmonary opacity. No pulmonary edema. No pleural effusion. No pneumothorax. HEART AND MEDIASTINUM: No acute abnormality of the cardiac and mediastinal silhouettes. BONES AND SOFT TISSUES: No acute osseous abnormality. IMPRESSION: 1. No acute process. Electronically signed by: Norman Gatlin MD 01/23/2024 08:10 PM EDT RP Workstation: [id number]   DG Chest 2 View Result Date: 01/21/2024 CLINICAL DATA:  Shortness of breath. EXAM: CHEST - 2 VIEW COMPARISON:  01/01/2024. FINDINGS: There is faint opacity overlying the right mid lung zone, which is of indeterminate origin. This is not well seen on the lateral film. This may be external, soft tissue calcification or due to focal lung parenchymal opacity. Correlate clinically. Bilateral lung fields are otherwise clear. Bilateral costophrenic angles are clear. Normal cardio-mediastinal silhouette. No acute osseous abnormalities. The soft tissues are within normal limits. IMPRESSION: Faint opacity overlying the right mid lung zone, as described above. Otherwise no acute cardiopulmonary abnormality. Electronically Signed   By: Ree Molt M.D.   On: 01/21/2024 14:55    Assessment/Plan  1. COPD with acute exacerbation (HCC) (Primary) -  Recent exacerbation treated with dexamethasone . Non-compliance with medications and oxygen  therapy. Continues smoking, worsening symptoms. - Refer to pulmonology. - Prescribe albuterol  inhaler,  2 puffs every 4 hours as needed. - Prescribe prednisone , 40 mg daily for 4 days. - Prescribe Incruse inhaler, 1 puff daily. - Advise to stop smoking - Ambulatory referral to Pulmonology - albuterol  (VENTOLIN  HFA) 108 (90 Base) MCG/ACT inhaler; Inhale 2 puffs into the lungs every 4 (four) hours as needed for wheezing or shortness of breath.  Dispense: 18 g; Refill: 1 - fluticasone  furoate-vilanterol (BREO ELLIPTA ) 100-25 MCG/ACT AEPB; Inhale 1 puff into the lungs daily.  Dispense: 60 each; Refill: 3 - ipratropium-albuterol  (DUONEB) 0.5-2.5 (3) MG/3ML SOLN; Take 3 mLs by nebulization every 6 (six)  hours as needed (for shortness of breath or wheezing).  Dispense: 360 mL; Refill: 1 - umeclidinium bromide  (INCRUSE ELLIPTA ) 62.5 MCG/ACT AEPB; Inhale 1 puff into the lungs daily.  Dispense: 30 each; Refill: 1  2. Tobacco abuse -  Continues smoking despite COPD diagnosis and exacerbation. Previous cessation attempts unsuccessful. - currently on nicotine  patch to aid smoking cessation, but noncompliant - nicotine  (NICODERM CQ  - DOSED IN MG/24 HOURS) 21 mg/24hr patch; Place 1 patch (21 mg total) onto the skin daily.  Dispense: 28 patch; Refill: 1  3. Seizure disorder (HCC) -  Non-compliance with Keppra  and Zonegran . Recent seizures reported. History of alcohol -related seizures. - Refer to neurology, has appointment on 03/28/2024 - Prescribe Keppra  XR, 500 mg twice daily. - Prescribe Zonegran , 100 mg twice daily. - folic acid  (FOLVITE ) 1 MG tablet; Take 1 tablet (1 mg total) by mouth daily.  Dispense: 90 tablet; Refill: 1 - levETIRAcetam  (KEPPRA  XR) 500 MG 24 hr tablet; Take 1 tablet (500 mg total) by mouth 2 (two) times daily.  Dispense: 180 tablet; Refill: 1 - zonisamide  (ZONEGRAN ) 100 MG capsule; Take 2 capsules (200 mg total) by mouth daily.  Dispense: 90 capsule; Refill: 1 - CBC with Differential/Platelets - Levetiracetam  level  4. Alcohol  use disorder, severe, dependence (HCC) -  Continues daily  alcohol  consumption. History of overdose in 2023 with cognitive decline. - Advise to stop alcohol  consumption.  5. Schizophrenia, unspecified type (HCC) -  Non-compliance with Abilify .  - Refer to psychiatry. - Prescribe Abilify , 10 mg daily. Scored 12 on PHQ-9 indicating moderate depression. . - Refer to psychiatry. - Ambulatory referral to Psychiatry - ARIPiprazole  (ABILIFY ) 10 MG tablet; Take 1 tablet (10 mg total) by mouth daily.  Dispense: 90 tablet; Refill: 1 - divalproex  (DEPAKOTE  SPRINKLE) 125 MG capsule; Take 2 capsules (250 mg total) by mouth every 12 (twelve) hours.  Dispense: 180 capsule; Refill: 1 - TSH  6. Type 2 diabetes mellitus with other specified complication, without long-term current use of insulin  (HCC) -  Non-compliance with metformin . No recent A1c available. Cognitive issues may affect adherence. - Prescribe metformin  750 mg daily with breakfast. - Check A1c level.  - Microalbumin/Creatinine Ratio, Urine - Ambulatory referral to Ophthalmology - metFORMIN  (GLUCOPHAGE -XR) 750 MG 24 hr tablet; Take 1 tablet (750 mg total) by mouth daily with breakfast.  Dispense: 90 tablet; Refill: 1 - Hemoglobin A1C - Lipid panel  7. Primary hypertension -  Non-compliance with metoprolol . Current blood pressure 132/78 mmHg. - metoprolol  tartrate (LOPRESSOR ) 25 MG tablet; Take 1 tablet (25 mg total) by mouth 2 (two) times daily.  Dispense: 180 tablet; Refill: 1 - Basic Metabolic Panel - Lipid panel  8. Gastroesophageal reflux disease without esophagitis -  Reports occasional heartburn. Non-compliance with Protonix . - Prescribe Protonix , 40 mg daily. - pantoprazole  (PROTONIX ) 40 MG tablet; Take 1 tablet (40 mg total) by mouth daily.  Dispense: 90 tablet; Refill: 1  9. Onychomycosis -  Brownish toenails suggestive of onychomycosis, possibly yeast infection. - Refer to podiatry. - Ambulatory referral to Podiatry  10. Encounter to establish care   Established care with  [facility]  11. Screening mammogram for breast cancer - MM 3D SCREENING MAMMOGRAM BILATERAL BREAST  12. Screening for colorectal cancer - Ambulatory referral to Gastroenterology  13. Screening for cervical cancer - Ambulatory referral to Obstetrics / Gynecology  14. Screening for osteoporosis - DG Bone Density; Future    Labs/tests ordered:   CBC, BMP, A1C, lipid panel, keppra  level, mammogram, bone density, urine  microalbumin creatinine ratio, tsh   Return in about 2 weeks (around 02/19/2024).  Ioannis Schuh Medina-Vargas, NP

## 2024-02-08 ENCOUNTER — Ambulatory Visit: Payer: Self-pay | Admitting: Adult Health

## 2024-02-08 NOTE — Progress Notes (Signed)
-     WBC 13.1, elevated due to Prednisone  intake -  hemoglobin 9.1, down from 9.7 -  tsh, lipid panel, electrolytes, urine creatinine microalbumin ratio normal -  A1C 7.4, up from 6.5, please take Metformin  regularly

## 2024-02-09 LAB — BASIC METABOLIC PANEL WITH GFR
BUN/Creatinine Ratio: 17 (calc) (ref 6–22)
BUN: 8 mg/dL (ref 7–25)
CO2: 28 mmol/L (ref 20–32)
Calcium: 8.8 mg/dL (ref 8.6–10.4)
Chloride: 103 mmol/L (ref 98–110)
Creat: 0.46 mg/dL — ABNORMAL LOW (ref 0.50–1.03)
Glucose, Bld: 81 mg/dL (ref 65–139)
Potassium: 4.2 mmol/L (ref 3.5–5.3)
Sodium: 141 mmol/L (ref 135–146)
eGFR: 110 mL/min/1.73m2 (ref >=60)

## 2024-02-09 LAB — CBC WITH DIFFERENTIAL/PLATELET
Absolute Lymphocytes: 2291 {cells}/uL (ref 850–3900)
Absolute Monocytes: 817 {cells}/uL (ref 200–950)
Basophils Absolute: 40 {cells}/uL (ref 0–200)
Basophils Relative: 0.3 %
Eosinophils Absolute: 13 {cells}/uL — ABNORMAL LOW (ref 15–500)
Eosinophils Relative: 0.1 %
HCT: 31.4 % — ABNORMAL LOW (ref 35.0–45.0)
Hemoglobin: 9.1 g/dL — ABNORMAL LOW (ref 11.7–15.5)
MCH: 22.9 pg — ABNORMAL LOW (ref 27.0–33.0)
MCHC: 29 g/dL — ABNORMAL LOW (ref 32.0–36.0)
MCV: 78.9 fL — ABNORMAL LOW (ref 80.0–100.0)
MPV: 9.9 fL (ref 7.5–12.5)
Monocytes Relative: 6.1 %
Neutro Abs: 10238 {cells}/uL — ABNORMAL HIGH (ref 1500–7800)
Neutrophils Relative %: 76.4 %
Platelets: 243 Thousand/uL (ref 140–400)
RBC: 3.98 Million/uL (ref 3.80–5.10)
RDW: 18.7 % — ABNORMAL HIGH (ref 11.0–15.0)
Total Lymphocyte: 17.1 %
WBC: 13.4 Thousand/uL — ABNORMAL HIGH (ref 3.8–10.8)

## 2024-02-09 LAB — LIPID PANEL
Cholesterol: 184 mg/dL (ref <200)
HDL: 110 mg/dL (ref >=50)
LDL Cholesterol (Calc): 59 mg/dL
Non-HDL Cholesterol (Calc): 74 mg/dL (ref <130)
Total CHOL/HDL Ratio: 1.7 (calc) (ref <5.0)
Triglycerides: 70 mg/dL (ref <150)

## 2024-02-09 LAB — HEMOGLOBIN A1C
Hgb A1c MFr Bld: 7.4 % — ABNORMAL HIGH (ref <5.7)
Mean Plasma Glucose: 166 mg/dL
eAG (mmol/L): 9.2 mmol/L

## 2024-02-09 LAB — MICROALBUMIN / CREATININE URINE RATIO
Creatinine, Urine: 60 mg/dL (ref 20–275)
Microalb, Ur: <0.2 mg/dL

## 2024-02-09 LAB — LEVETIRACETAM LEVEL: Keppra (Levetiracetam): <2 ug/mL — ABNORMAL LOW

## 2024-02-09 LAB — TSH: TSH: 1.23 m[IU]/L (ref 0.40–4.50)

## 2024-02-10 ENCOUNTER — Other Ambulatory Visit (HOSPITAL_COMMUNITY): Payer: Self-pay

## 2024-02-10 ENCOUNTER — Other Ambulatory Visit: Payer: Self-pay

## 2024-02-10 ENCOUNTER — Emergency Department (HOSPITAL_COMMUNITY)

## 2024-02-10 ENCOUNTER — Emergency Department (HOSPITAL_COMMUNITY)
Admission: EM | Admit: 2024-02-10 | Discharge: 2024-02-10 | Disposition: A | Discharge status: Home / Self Care | Attending: Emergency Medicine | Admitting: Emergency Medicine

## 2024-02-10 DIAGNOSIS — M545 Low back pain, unspecified: Secondary | ICD-10-CM | POA: Diagnosis present

## 2024-02-10 DIAGNOSIS — M5441 Lumbago with sciatica, right side: Secondary | ICD-10-CM | POA: Insufficient documentation

## 2024-02-10 DIAGNOSIS — F17200 Nicotine dependence, unspecified, uncomplicated: Secondary | ICD-10-CM | POA: Insufficient documentation

## 2024-02-10 DIAGNOSIS — M5442 Lumbago with sciatica, left side: Secondary | ICD-10-CM | POA: Diagnosis not present

## 2024-02-10 DIAGNOSIS — G8929 Other chronic pain: Secondary | ICD-10-CM | POA: Diagnosis not present

## 2024-02-10 DIAGNOSIS — J449 Chronic obstructive pulmonary disease, unspecified: Secondary | ICD-10-CM | POA: Insufficient documentation

## 2024-02-10 MED ORDER — CYCLOBENZAPRINE HCL 10 MG PO TABS
5.0000 mg | ORAL_TABLET | Freq: Once | ORAL | Status: AC
  Administered 2024-02-10: 5 mg via ORAL
  Filled 2024-02-10: qty 1

## 2024-02-10 MED ORDER — CYCLOBENZAPRINE HCL 5 MG PO TABS
5.0000 mg | ORAL_TABLET | Freq: Three times a day (TID) | ORAL | 0 refills | Status: DC | PRN
  Filled 2024-02-10: qty 30, 10d supply, fill #0

## 2024-02-10 MED ORDER — ACETAMINOPHEN 500 MG PO TABS
1000.0000 mg | ORAL_TABLET | Freq: Once | ORAL | Status: AC
  Administered 2024-02-10: 1000 mg via ORAL
  Filled 2024-02-10: qty 2

## 2024-02-10 NOTE — ED Provider Notes (Signed)
 [place] EMERGENCY DEPARTMENT AT [facility] Provider Note   CSN: [id number] Arrival date & time: 02/10/24  1317     Patient presents with: Back Pain   [name] is a 58 y.o. female.   HPI Patient reports that she has pain that initially was more in her lower back and radiating to both legs.  Then she felt like it migrated more to her upper back and radiating to her arms.  Now she has achiness across her upper back and achiness in the arms.  Patient has been up and walking about without difficult.  She reports she has a history of seizures and now she is wondering if she had a seizure because she has some bruises and she is just got so much generalized achiness.  Patient has significant chronic medical history including fairly severe COPD but is a still smoking and history of alcohol  abuse and seizure disorder on Keppra .  Patient denies she specific had any falls that she recalls.    Prior to Admission medications   Medication Sig Start Date End Date Taking? Authorizing Provider  cyclobenzaprine  (FLEXERIL ) 5 MG tablet Take 1 tablet (5 mg total) by mouth 3 (three) times daily as needed for muscle spasms. 02/10/24  Yes Armenta Canning, MD  albuterol  (VENTOLIN  HFA) 108 (90 Base) MCG/ACT inhaler Inhale 2 puffs into the lungs every 4 (four) hours as needed for wheezing or shortness of breath. 02/05/24   Medina-Vargas, Monina C, NP  ARIPiprazole  (ABILIFY ) 10 MG tablet Take 1 tablet (10 mg total) by mouth daily. 02/05/24   Medina-Vargas, Monina C, NP  divalproex  (DEPAKOTE  SPRINKLE) 125 MG capsule Take 2 capsules (250 mg total) by mouth every 12 (twelve) hours. 02/05/24   Medina-Vargas, Monina C, NP  fluticasone  furoate-vilanterol (BREO ELLIPTA ) 100-25 MCG/ACT AEPB Inhale 1 puff into the lungs daily. 02/05/24   Medina-Vargas, Monina C, NP  folic acid  (FOLVITE ) 1 MG tablet Take 1 tablet (1 mg total) by mouth daily. 02/05/24   Medina-Vargas, Monina C, NP  ipratropium-albuterol  (DUONEB)  0.5-2.5 (3) MG/3ML SOLN Take 3 mLs by nebulization every 6 (six) hours as needed (for shortness of breath or wheezing). 02/05/24   Medina-Vargas, Monina C, NP  levETIRAcetam  (KEPPRA  XR) 500 MG 24 hr tablet Take 1 tablet (500 mg total) by mouth 2 (two) times daily. 02/05/24   Medina-Vargas, Monina C, NP  metFORMIN  (GLUCOPHAGE -XR) 750 MG 24 hr tablet Take 1 tablet (750 mg total) by mouth daily with breakfast. 02/05/24   Medina-Vargas, Monina C, NP  metoprolol  tartrate (LOPRESSOR ) 25 MG tablet Take 1 tablet (25 mg total) by mouth 2 (two) times daily. 02/05/24   Medina-Vargas, Monina C, NP  nicotine  (NICODERM CQ  - DOSED IN MG/24 HOURS) 21 mg/24hr patch Place 1 patch (21 mg total) onto the skin daily. 02/05/24   Medina-Vargas, Monina C, NP  OXYGEN  Inhale 4-5 L/min into the lungs daily as needed (for COPD-related symptoms).    [provider]  pantoprazole  (PROTONIX ) 40 MG tablet Take 1 tablet (40 mg total) by mouth daily. 02/05/24   Medina-Vargas, Monina C, NP  predniSONE  (DELTASONE ) 20 MG tablet Take 2 tablets (40 mg total) by mouth daily for 4 days Patient not taking: Reported on 02/05/2024 02/02/24   Johnie Flaming A, NP  thiamine  (VITAMIN B-1) 100 MG tablet Take 1 tablet (100 mg total) by mouth daily. Patient not taking: Reported on 02/05/2024 11/14/23   Raenelle Donalda HERO, MD  umeclidinium bromide  (INCRUSE ELLIPTA ) 62.5 MCG/ACT AEPB Inhale 1  puff into the lungs daily. 02/05/24   Medina-Vargas, Monina C, NP  zonisamide  (ZONEGRAN ) 100 MG capsule Take 2 capsules (200 mg total) by mouth daily. 02/05/24   Medina-Vargas, Monina C, NP    Allergies: Iron  dextran, Aspirin , and Penicillins    Review of Systems  Updated Vital Signs BP (!) 140/79 (BP Location: Right Arm)   Pulse (!) 102   Temp 98.2 F (36.8 C) (Oral)   Resp 18   Ht 5' 2 (1.575 m)   Wt 56.2 kg   LMP 01/08/2011   SpO2 100%   BMI 22.66 kg/m   Physical Exam Constitutional:      Comments: Alert nontoxic.  Speaking in full sentences  without difficulty.  No respiratory distress at rest.  Patient has been up and ambulatory multiple times.  HENT:     Head: Normocephalic and atraumatic.     Nose: Nose normal.     Mouth/Throat:     Pharynx: Oropharynx is clear.  Eyes:     Extraocular Movements: Extraocular movements intact.  Neck:     Comments: No midline C-spine tenderness. Cardiovascular:     Rate and Rhythm: Normal rate and regular rhythm.  Pulmonary:     Comments: No respiratory distress at rest.  Patient does have diffuse wheezing on exam.  Adequate airflow to the lower lung fields symmetrically. Abdominal:     General: There is no distension.     Palpations: Abdomen is soft.     Tenderness: There is no abdominal tenderness. There is no guarding.  Musculoskeletal:     Cervical back: Neck supple.     Comments: Patient endorses discomfort palpation throughout all of the back.  There is no focal midline tenderness.  She endorses tenderness through extensive swaths of the muscle bodies of upper back as well as lower back.  No palpable abnormalities.  Bilateral lower extremities are symmetric.  She has no peripheral edema.  Calves are soft and pliable.  Patient has good flexion and extension of both lower extremities without difficulty.  Patient has been position changing without limitation sitting up standing lying down and ambulating.  Skin:    General: Skin is warm and dry.  Neurological:     General: No focal deficit present.     Mental Status: She is oriented to person, place, and time.     Motor: No weakness.     Coordination: Coordination normal.  Psychiatric:        Mood and Affect: Mood normal.     (all labs ordered are listed, but only abnormal results are displayed) Labs Reviewed - No data to display  EKG: None  Radiology: DG Lumbar Spine Complete Result Date: 02/10/2024 CLINICAL DATA:  upper midline back pain EXAM: LUMBAR SPINE - COMPLETE 4+ VIEW COMPARISON:  CT lumbar spine dated 01/24/2024.  Thoracolumbar spine radiographs dated 02/22/2023. FINDINGS: 5 nonrib bearing lumbar-type vertebral bodies. Vertebral body heights are maintained. No acute fracture. No static listhesis. Disc spaces are relatively maintained. Moderate-to-severe facet arthropathy at L4-L5 and L5-S1. SI joints are unremarkable. Remote right posterior 11th and 12th rib fractures. IMPRESSION: 1. No acute fracture or traumatic listhesis of the lumbar spine. 2. Moderate-to-severe facet arthropathy at L4-L5 and L5-S1. Electronically Signed   By: Harrietta Sherry M.D.   On: 02/10/2024 14:40   DG Thoracic Spine 2 View Result Date: 02/10/2024 CLINICAL DATA:  upper midline back pain EXAM: THORACIC SPINE 2 VIEWS COMPARISON:  CT abdomen/pelvis dated 01/23/2024. Thoracolumbar spine radiographs dated 02/22/2023. FINDINGS: Remote  compression deformities of T6 and T9 are not significantly changed. Vertebral body heights otherwise appear maintained. Alignment is normal. Multilevel degenerative changes of the thoracic spine. IMPRESSION: 1. Remote compression deformities of T6 and T9 are not significantly changed. No acute fracture identified. 2. Multilevel degenerative changes of the thoracic spine. Electronically Signed   By: Harrietta Sherry M.D.   On: 02/10/2024 14:36   DG Shoulder Right Result Date: 02/10/2024 CLINICAL DATA:  Pain. EXAM: RIGHT SHOULDER - 2+ VIEW COMPARISON:  None Available. FINDINGS: There is no evidence of acute fracture or dislocation. Mild osteoarthritis of the glenohumeral joint. Mild-to-moderate osteoarthritis of the acromioclavicular joint. Soft tissues are unremarkable. IMPRESSION: 1. No acute osseous abnormality. 2. Mild glenohumeral joint osteoarthritis. 3. Mild-to-moderate acromioclavicular joint osteoarthritis. Electronically Signed   By: Harrietta Sherry M.D.   On: 02/10/2024 14:32     Procedures   Medications Ordered in the ED  acetaminophen  (TYLENOL ) tablet 1,000 mg (1,000 mg Oral Given 02/10/24 1636)   cyclobenzaprine  (FLEXERIL ) tablet 5 mg (5 mg Oral Given 02/10/24 1636)                                    Medical Decision Making Risk OTC drugs. Prescription drug management.   Patient presents as outlined.  Patient has complex medical history.  Today's presentation is regarding back pain.  Clinically patient is well in appearance.  Vital signs are stable.  The patient is neurologically intact.  No specific traumatic injury.  Possibility of a seizure but no specific evidence of such.  Currently patient is not showing any signs of being postictal and no signs of head trauma.  Imaging was obtained through triage for lumbar and thoracic spine films and shoulder based on patient's reports of pain.  At this time no acute findings by radiology's review.  I have reviewed patient's medical record and there have been significant imaging of the spine including CT imaging specifically of lumbar spine and also CT imaging of chest and abdomen without other acute findings.  At this time with no neurologic deficit and otherwise normal strength exam, I do not feel patient needs additional imaging currently.  Patient has diffuse musculoskeletal pain of the upper and lower back.  She describes a possibility of having had a seizure.  This might be musculoskeletal pain after a seizure or possibly sciatica with variable symptoms.  She describes first having low back pain with radiation to the legs but no neurologic dysfunction and then pain that migrated more to the upper back and shoulder.  At this time we will opt to treat with Exer strength Tylenol  and Flexeril  if needed.  The patient is discharged in good condition.  She is alert and well in appearance sitting at the edge of the stretcher.  No distress.  She has significant COPD at baseline and does continue smoke.  At this time no indication of respiratory distress.     Final diagnoses:  Chronic bilateral low back pain with bilateral sciatica    ED  Discharge Orders          Ordered    cyclobenzaprine  (FLEXERIL ) 5 MG tablet  3 times daily PRN        02/10/24 1704               Armenta Canning, MD 02/10/24 1707

## 2024-02-10 NOTE — ED Notes (Signed)
 Called pt daughter to give update.

## 2024-02-10 NOTE — ED Notes (Addendum)
 Pt does not have cell phone and daughter plans to send Gisele for pt pickup. Pt was adamant about leaving to smoke a cigarrette. Pt was asked to sit on the bench in front of the ED and look for an [employer] sign on the vehicle and confirm picking up name before entering the vehicle. Legal guardian notified.

## 2024-02-10 NOTE — Discharge Instructions (Signed)
 1.  Take exercise Tylenol  for back pain.  If you need the pain for muscle spasms, take a Flexeril  tablet every 8 hours as prescribed. 2.  Right now your pain seems to be musculoskeletal.  You are not having any areas of weakness or dysfunction.  Follow-up with your doctor for recheck within the next 3 to 7 days to make sure you are improving. 3.  Return to the emergency department if you have loss of control of your bowel or your bladder, weakness or numbness to your legs or arms or other concerning changes.

## 2024-02-10 NOTE — ED Triage Notes (Signed)
 Pt BIB EMS for back pain. Woke up this morning with non traumatic back pain. Originates in lower back radiate to legs. Has hx os same. OTC meds not helping. Pt also has runny nose and coughh. Cbg 88

## 2024-02-10 NOTE — ED Provider Triage Note (Signed)
 Emergency Medicine Provider Triage Evaluation Note  [name] , a 58 y.o. female  was evaluated in triage.  Pt complains of upper and lower back pain and right shoulder pain.  Patient states she fell asleep on the couch last night and woke up with pain.  Patient states she thinks she might have had a seizure.  Unknown when her last seizure occurred.  Review of Systems  Positive:  Negative: See above   Physical Exam  BP 121/84 (BP Location: Right Arm)   Pulse 87   Temp 98.3 F (36.8 C) (Oral)   Resp 15   Ht 5' 2 (1.575 m)   Wt 56.2 kg   LMP 01/08/2011   SpO2 98%   BMI 22.66 kg/m  Gen:   Awake, no distress   Resp:  Normal effort  MSK:   Moves extremities without difficulty  Other:    Medical Decision Making  Medically screening exam initiated at 1:57 PM.  Appropriate orders placed.  [name] was informed that the remainder of the evaluation will be completed by another provider, this initial triage assessment does not replace that evaluation, and the importance of remaining in the ED until their evaluation is complete.     [name], [place] 02/10/24 [phone]

## 2024-02-10 NOTE — ED Notes (Signed)
Pt is back in the waiting room

## 2024-02-10 NOTE — ED Notes (Addendum)
 Pt stated she is leaving to go smoke. She stated she will be back in a minute. Pt seen leaving the ED.

## 2024-02-10 NOTE — ED Notes (Signed)
 Pt stated she is leaving to main hospital to find her daughter. Pt seen leaving ED.

## 2024-02-12 ENCOUNTER — Encounter (HOSPITAL_COMMUNITY): Payer: Self-pay

## 2024-02-12 ENCOUNTER — Emergency Department (EMERGENCY_DEPARTMENT_HOSPITAL)
Admission: EM | Admit: 2024-02-12 | Discharge: 2024-02-13 | Disposition: A | Discharge status: Other Acute Inpatient Hospital | Source: Home / Self Care | Attending: Emergency Medicine | Admitting: Emergency Medicine

## 2024-02-12 ENCOUNTER — Other Ambulatory Visit (HOSPITAL_COMMUNITY): Payer: Self-pay

## 2024-02-12 ENCOUNTER — Other Ambulatory Visit: Payer: Self-pay

## 2024-02-12 DIAGNOSIS — F1020 Alcohol dependence, uncomplicated: Secondary | ICD-10-CM | POA: Diagnosis present

## 2024-02-12 DIAGNOSIS — Z79899 Other long term (current) drug therapy: Secondary | ICD-10-CM | POA: Insufficient documentation

## 2024-02-12 DIAGNOSIS — Z7984 Long term (current) use of oral hypoglycemic drugs: Secondary | ICD-10-CM | POA: Insufficient documentation

## 2024-02-12 DIAGNOSIS — Z7951 Long term (current) use of inhaled steroids: Secondary | ICD-10-CM | POA: Insufficient documentation

## 2024-02-12 DIAGNOSIS — E119 Type 2 diabetes mellitus without complications: Secondary | ICD-10-CM | POA: Insufficient documentation

## 2024-02-12 DIAGNOSIS — R45851 Suicidal ideations: Secondary | ICD-10-CM | POA: Insufficient documentation

## 2024-02-12 DIAGNOSIS — J449 Chronic obstructive pulmonary disease, unspecified: Secondary | ICD-10-CM | POA: Insufficient documentation

## 2024-02-12 DIAGNOSIS — I10 Essential (primary) hypertension: Secondary | ICD-10-CM | POA: Insufficient documentation

## 2024-02-12 DIAGNOSIS — F603 Borderline personality disorder: Secondary | ICD-10-CM | POA: Diagnosis not present

## 2024-02-12 DIAGNOSIS — E039 Hypothyroidism, unspecified: Secondary | ICD-10-CM | POA: Insufficient documentation

## 2024-02-12 DIAGNOSIS — F259 Schizoaffective disorder, unspecified: Secondary | ICD-10-CM | POA: Diagnosis not present

## 2024-02-12 DIAGNOSIS — Z96643 Presence of artificial hip joint, bilateral: Secondary | ICD-10-CM | POA: Insufficient documentation

## 2024-02-12 DIAGNOSIS — T50912A Poisoning by multiple unspecified drugs, medicaments and biological substances, intentional self-harm, initial encounter: Secondary | ICD-10-CM | POA: Diagnosis present

## 2024-02-12 LAB — COMPREHENSIVE METABOLIC PANEL WITH GFR
ALT: 15 U/L (ref 0–44)
AST: 28 U/L (ref 15–41)
Albumin: 3.7 g/dL (ref 3.5–5.0)
Alkaline Phosphatase: 80 U/L (ref 38–126)
Anion gap: 11 (ref 5–15)
BUN: 5 mg/dL — ABNORMAL LOW (ref 6–20)
CO2: 22 mmol/L (ref 22–32)
Calcium: 9.4 mg/dL (ref 8.9–10.3)
Chloride: 104 mmol/L (ref 98–111)
Creatinine, Ser: 0.78 mg/dL (ref 0.44–1.00)
GFR, Estimated: >60 mL/min (ref >60)
Glucose, Bld: 149 mg/dL — ABNORMAL HIGH (ref 70–99)
Potassium: 4.5 mmol/L (ref 3.5–5.1)
Sodium: 137 mmol/L (ref 135–145)
Total Bilirubin: 0.9 mg/dL (ref 0.0–1.2)
Total Protein: 7.2 g/dL (ref 6.5–8.1)

## 2024-02-12 LAB — CBC WITH DIFFERENTIAL/PLATELET
Abs Immature Granulocytes: 0.02 K/uL (ref 0.00–0.07)
Basophils Absolute: 0 K/uL (ref 0.0–0.1)
Basophils Relative: 1 %
Eosinophils Absolute: 0 K/uL (ref 0.0–0.5)
Eosinophils Relative: 1 %
HCT: 36.5 % (ref 36.0–46.0)
Hemoglobin: 10.8 g/dL — ABNORMAL LOW (ref 12.0–15.0)
Immature Granulocytes: 0 %
Lymphocytes Relative: 37 %
Lymphs Abs: 2.3 K/uL (ref 0.7–4.0)
MCH: 22.6 pg — ABNORMAL LOW (ref 26.0–34.0)
MCHC: 29.6 g/dL — ABNORMAL LOW (ref 30.0–36.0)
MCV: 76.4 fL — ABNORMAL LOW (ref 80.0–100.0)
Monocytes Absolute: 0.7 K/uL (ref 0.1–1.0)
Monocytes Relative: 11 %
Neutro Abs: 3.1 K/uL (ref 1.7–7.7)
Neutrophils Relative %: 50 %
Platelets: 336 K/uL (ref 150–400)
RBC: 4.78 MIL/uL (ref 3.87–5.11)
RDW: 21.2 % — ABNORMAL HIGH (ref 11.5–15.5)
WBC: 6.1 K/uL (ref 4.0–10.5)
nRBC: 0 % (ref 0.0–0.2)

## 2024-02-12 LAB — RAPID URINE DRUG SCREEN, HOSP PERFORMED
Amphetamines: NOT DETECTED
Barbiturates: NOT DETECTED
Benzodiazepines: POSITIVE — AB
Cocaine: NOT DETECTED
Opiates: NOT DETECTED
Tetrahydrocannabinol: NOT DETECTED

## 2024-02-12 LAB — ETHANOL: Alcohol, Ethyl (B): <15 mg/dL (ref <15)

## 2024-02-12 LAB — SALICYLATE LEVEL: Salicylate Lvl: <7 mg/dL — ABNORMAL LOW (ref 7.0–30.0)

## 2024-02-12 LAB — ACETAMINOPHEN LEVEL: Acetaminophen (Tylenol), Serum: <10 ug/mL — ABNORMAL LOW (ref 10–30)

## 2024-02-12 LAB — CBG MONITORING, ED: Glucose-Capillary: 162 mg/dL — ABNORMAL HIGH (ref 70–99)

## 2024-02-12 MED ORDER — CHLORDIAZEPOXIDE HCL 25 MG PO CAPS: 25.0000 mg | ORAL_CAPSULE | Freq: Four times a day (QID) | ORAL | Status: DC | PRN

## 2024-02-12 MED ORDER — CHLORDIAZEPOXIDE HCL 25 MG PO CAPS: 25.0000 mg | ORAL_CAPSULE | ORAL | Status: DC

## 2024-02-12 MED ORDER — OLANZAPINE 10 MG IM SOLR
10.0000 mg | Freq: Once | INTRAMUSCULAR | Status: AC
  Administered 2024-02-12: 10 mg via INTRAMUSCULAR
  Filled 2024-02-12 (×2): qty 10

## 2024-02-12 MED ORDER — THIAMINE HCL 100 MG/ML IJ SOLN
100.0000 mg | Freq: Once | INTRAMUSCULAR | Status: AC
  Administered 2024-02-12: 100 mg via INTRAMUSCULAR
  Filled 2024-02-12: qty 2

## 2024-02-12 MED ORDER — LOPERAMIDE HCL 2 MG PO CAPS: 2.0000 mg | ORAL_CAPSULE | ORAL | Status: DC | PRN

## 2024-02-12 MED ORDER — MIDAZOLAM HCL 2 MG/2ML IJ SOLN
2.0000 mg | Freq: Once | INTRAMUSCULAR | Status: DC
  Filled 2024-02-12: qty 2

## 2024-02-12 MED ORDER — NICOTINE 7 MG/24HR TD PT24
7.0000 mg | MEDICATED_PATCH | Freq: Once | TRANSDERMAL | Status: AC
  Administered 2024-02-12: 7 mg via TRANSDERMAL
  Filled 2024-02-12: qty 1

## 2024-02-12 MED ORDER — ONDANSETRON 4 MG PO TBDP: 4.0000 mg | ORAL_TABLET | Freq: Four times a day (QID) | ORAL | Status: DC | PRN

## 2024-02-12 MED ORDER — STERILE WATER FOR INJECTION IJ SOLN
INTRAMUSCULAR | Status: AC
  Administered 2024-02-12: 2.1 mL
  Filled 2024-02-12: qty 10

## 2024-02-12 MED ORDER — HYDROXYZINE HCL 25 MG PO TABS
25.0000 mg | ORAL_TABLET | Freq: Four times a day (QID) | ORAL | Status: DC | PRN
Start: 2024-02-12 — End: 2024-02-13

## 2024-02-12 MED ORDER — CHLORDIAZEPOXIDE HCL 25 MG PO CAPS: 25.0000 mg | ORAL_CAPSULE | Freq: Three times a day (TID) | ORAL | Status: DC

## 2024-02-12 MED ORDER — CHLORDIAZEPOXIDE HCL 25 MG PO CAPS
25.0000 mg | ORAL_CAPSULE | Freq: Four times a day (QID) | ORAL | Status: DC
  Administered 2024-02-12 – 2024-02-13 (×4): 25 mg via ORAL
  Filled 2024-02-12 (×4): qty 1

## 2024-02-12 MED ORDER — MIDAZOLAM HCL 2 MG/2ML IJ SOLN
2.0000 mg | Freq: Once | INTRAMUSCULAR | Status: AC
  Administered 2024-02-12: 2 mg via INTRAMUSCULAR

## 2024-02-12 MED ORDER — CHLORDIAZEPOXIDE HCL 25 MG PO CAPS: 25.0000 mg | ORAL_CAPSULE | Freq: Every day | ORAL | Status: DC

## 2024-02-12 NOTE — Progress Notes (Signed)
 Inpatient Psychiatric Referral  Patient was recommended inpatient per Jerel Gravely, NP. There are no available beds at [facility], per [facility]. Patient was referred to the following out of network facilities:  Destination  Service Provider Request Status Address Phone Fax  [facility]  Pending - Request Sent [address] [place] [address] [phone]  [facility]  Pending - Request Sent [address] [place] [address] [phone]  [facility]  Pending - Request Sent [address] [place], [place] [address] [phone]  [facility]  Pending - Request Sent [address], [place] [address] [phone]  [facility]  Pending - Request Sent [address] [place] [address] [phone]  [facility]  Pending - Request Sent [address] [place] [address] [phone]  [facility]  Pending - Request Sent [address] [place] [address] [phone]  [facility]  Pending - Request Sent [address] [place] [phone]  [facility]  Pending - Request Sent [address] [place] [address] [phone]  [facility]  Pending - Request Sent [address] [place] [address] [phone]    Situation ongoing, CSW to continue following and update chart as more information becomes available.  Harrie Sofia MSW, LCSWA 02/12/2024  7:24PM

## 2024-02-12 NOTE — ED Notes (Signed)
 IVC PAPERWORK COMPLETED COPIES MAKE  ALL 3  SECTION UPLOAD TO THE COURT OF CLERK WILL ADD CASE # WHEN I RECEIVE THE NUMBER TODAY OR MONDAY ENVELOPE [id number]

## 2024-02-12 NOTE — Consult Note (Addendum)
 [facility] Psychiatric Consult Initial  Patient Name: .[name]  MRN: [id number]  DOB: 10-25-1965  Consult Order details:  Orders (From admission, onward)     Start     Ordered   02/12/24 1331  CONSULT TO CALL ACT TEAM       Ordering Provider: Emil Share, DO  Provider:  (Not yet assigned)  Question:  Reason for Consult?  Answer:  Psych consult   02/12/24 1330   02/12/24 1331  IP CONSULT TO PSYCHIATRY       Ordering Provider: Emil Share, DO  Provider:  (Not yet assigned)  Question:  Reason for consult:  Answer:  Medication management   02/12/24 1330             Mode of Visit: In person    Psychiatry Consult Evaluation  Service Date: February 12, 2024 LOS:  LOS: 0 days  Chief Complaint: Concern for suicide attempt by overdose  Primary Psychiatric Diagnoses   Suicide attempt by multiple drug overdose (HCC) 2.    Alcohol  use disorder, severe, dependence (HCC)   Assessment   [name] is a 58 y.o. AA female with a past psychiatric history of schizoaffective disorder, bipolar, schizophrenia, substance-induced mood disorder/psychosis, alcohol  use disorder severe, polysubstance abuse (I.e., though largely crack cocaine), and MDD, with pertinent medical comorbidities/history that includes COPD, atrial fibrillation, seizures, diabetes, and DDD, who presents this encounter by way of [employer] under involuntary commitment taken out by family for concerns for possible suicide attempt by way of overdose, who upon arrival and EDP evaluation, consulted psychiatry for specialty evaluation and recommendations. Patient is currently involuntarily committed at this time, as well as medically clear, per EDP team.  Upon evaluation, patient presents with symptomology that is most consistent with a suicide attempt by multiple drug overdose on prescription medications and alcohol  use disorder, severe, dependence.  Patient adamantly refutes she attempted suicide just prior to being brought in  this encounter, but collateral obtained from the patient's daughter gives testimony that the patient was found in her residence with the home barricaded so that no one could get in, and that the patient was found unresponsive on her couch agonal breathing, with multiple various prescription pills surrounding the patient's body on the floor, appearing to have been spilled, with the patient's 7-day/night pill holder completely open on the coffee table next to the patient on the table completely empty, to which in collaboration with collateral obtained from police listed below, gives overwhelming clinical suspicion that the patient did attempt suicide.  Given the substantial collateral reports obtained, for safety and security of the patient, recommendation is for inpatient mental health hospitalization at this time, under involuntary commitment.  Furthermore, from collateral obtained from the patient's daughter, as well as this provider's evaluation of the patient, and extensive knowledge of the patient over serial evaluations over the years, will initiate Librium  taper/CIWA protocols for anticipation of EtOH withdrawal, given the patient's severe and heavy EtOH use on a daily basis.  Given patient's overdose attempt, will refrain from further psychopharmacological intervention at this time, as well as because the patient is also not amenable to other psychopharmacological interventions, outside of CIWA protocol/Librium  taper.  Diagnoses:  Active Hospital problems: Principal Problem:   Suicide attempt by multiple drug overdose [facility]) Active Problems:   Alcohol  use disorder, severe, dependence (HCC)    Plan   #  Suicide attempt by multiple drug overdose (HCC) #  Alcohol  use disorder, severe, dependence (HCC)  ## Psychiatric  Medication Recommendations:   - Recommend CIWA protocols/Librium  taper for anticipated EtOH withdrawal - No recommendations for psychopharmacological intervention at this time,  given high clinical suspicion for overdose, as well as limited participation from patient at this time  ## Medical Decision Making Capacity: Has capacity  ## Further Work-up: UDS/UA  ## Disposition:--Recommend inpatient mental health hospitalization under involuntary commitment  ## Behavioral / Environmental: -Strict agitation/safety precautions    ## Safety and Observation Level:  - Based on my clinical evaluation, I estimate the patient to be at high risk of self harm in the current setting. - At this time, we recommend  1:1 Observation. This decision is based on my review of the chart including patient's history and current presentation, interview of the patient, mental status examination, and consideration of suicide risk including evaluating suicidal ideation, plan, intent, suicidal or self-harm behaviors, risk factors, and protective factors. This judgment is based on our ability to directly address suicide risk, implement suicide prevention strategies, and develop a safety plan while the patient is in the clinical setting. Please contact our team if there is a concern that risk level has changed.  CSSR Risk Category:   Suicide Risk Assessment: Patient has following modifiable risk factors for suicide: recklessness, medication noncompliance, lack of access to outpatient mental health resources, active mental illness (to encompass adhd, tbi, mania, psychosis, trauma reaction), current symptoms: anxiety/panic, insomnia, impulsivity, anhedonia, hopelessness, triggering events, and recent loss (death, isolation, vocation), which we are addressing by recommendations. Patient has following non-modifiable or demographic risk factors for suicide: separation or divorce, early widowhood, history of suicide attempt, history of self harm behavior, and psychiatric hospitalization Patient has the following protective factors against suicide: Access to outpatient mental health care, Supportive family, and  Supportive friends  Thank you for this consult request. Recommendations have been communicated to the primary team.  We will continue to follow at this time.   [name] JINNY Gravely, NP      History of Present Illness   [name] is a 58 y.o. AA female with a past psychiatric history of schizoaffective disorder, bipolar, schizophrenia, substance-induced mood disorder/psychosis, alcohol  use disorder severe, polysubstance abuse (I.e., though largely crack cocaine), and MDD, with pertinent medical comorbidities/history that includes COPD, atrial fibrillation, seizures, diabetes, and DDD, who presents this encounter by way of [employer] under involuntary commitment taken out by family for concerns for possible suicide attempt by way of overdose, who upon arrival and EDP evaluation, consulted psychiatry for specialty evaluation and recommendations. Patient is currently involuntarily committed at this time, as well as medically clear, per EDP team.  Patient seen today at the [facility] emergency department for face-to-face psychiatric evaluation.  Patient is well-known to the behavioral health service line, including this provider, frequently presents due to EtOH intoxication that subsequently leads to endorsements of suicidal and/or homicidal ideations, property destruction, harming herself and or others, and or the performing of reckless acts that endanger herself and or others. Patient is well supported by her daughter [name]   Upon approach, patient is immediately observed severely agitated, screaming at any staff that come near her, and physically posturing towards staff around her while in restraints, while perseverating out loud to everyone around her that, they're fucking lying! I didn't try to kill myself!.   Patient directed to speak to this provider to complete evaluation, but patient states, I ain't doing fucking shit! I need to be let go now!. Patient attempted to be redirected  several times,  and given encouragement to participate with this provider towards completing an evaluation, but outside of the repeated endorsements that she did not attempt to kill herself, that she is not suicidal and there is no reason to hold me, and endorsement that she continues to drink EtOH, every day, that's my mother fuckin' business, I can do what I want!, patient refuses to participate in any further meaningful way.  Patient orientation is grossly intact, without concerns for fluctuation of consciousness.  Patient does not appear to be in any physical pain.  Patient does not appear intoxicated on illicit substances and/or EtOH.  Collateral, patient's daughter, Ms. Rainelle Robertson, spoken to over the phone at [phone]  Call placed and extensive conversation held with the patient's daughter, of whom is also notably the IVC petitioner, and the patient's POA.  Patient's daughter endorses that she began to be worried about her mother when she could not get in touch with her last night to drop off food like she normally does, nor throughout the remainder of the night multiple times when she called her mother, or into the next day.  Patient's daughter reports that because the patient continued to not answer the phone for her and her sister into the next day, i.e. today, patient's daughter reports that her and her sister decided earlier this morning, that if their mother does not answer the phone, or is not heard from by 11:00am, they will proceed with having a welfare check conducted on the patient, or go over and check on the patient if they get a bad feeling.  Patient's daughter reports that she proceeded to get a bad feeling sometime before 17, around 1030, so she states that she proceeded to go over with her sister to check on the patient, when they proceeded to find that the patient had barricaded the home, locked all the doors, and oddly put a large stick blocking the sliding glass door  from sliding to let anyone in, so she states that she proceeded to look for a window that was unlocked, when eventually she states her and her sister had success and climbed into the home to check on the patient, when appreciably the patient's daughter states that they found their mother lying on the couch agonal breathing with pills on the floor surrounding her, and the patient's 7-day/night pill holder completely empty on the table next to her, so they proceeded to call EMS/[facility] for help, and filed IVC paperwork for concerns for suicide attempt by way of overdose, so the patient could be brought in for safety and additional care.  Patient's daughter additionally endorses that the patient has been increasingly giving endorsements of suicidal ideations over the last several months after the patient's husband died earlier this year, and gives insight that actually this is not the first time that the patient has attempted to overdose recently, stating that the patient has actually attempted recently to overdose this year but was not hospitalized, and attempted to overdose late last year but also did not require hospitalization.  Discussed with patient's daughter that given evaluation conducted by this provider, as well as collateral obtained from police and herself, recommendation would be for inpatient mental health hospitalization, for safety and stabilization of the patient.  Patient's daughter affirms that the patient continues to utilize EtOH daily in the form of 2 or 3 large malt liquor bottles per day, but has since stopped utilizing crack cocaine.  Discussed with patient's daughter that CIWA protocols/Librium  taper would be put  in place, but that at this time further psychopharmacological intervention would be held, given patient's refusal, as well as very likely overdose attempt that led to this encounter.  Collateral, [employer] at bedside, spoken to in person  GPD endorses that they found the patient in  the home and were immediately engaged with hostility by the patient. [facility]PD affirms that they did find evidence to suggest that the patient did overdose, found prescription pills lying all over the floor, found open pill bottles in the trash and on the floor, as well as affirm that the patient had her 7-day/night pill counter completely open on the counter next to her at one point.   Review of Systems  Unable to perform ROS: Other (Refuses)  Psychiatric/Behavioral:  Positive for substance abuse (I drink every day, that's my mother fuckin' business, I can do what I want!a). Negative for suicidal ideas.      Psychiatric and Social History  Psychiatric History:  Information collected from chart review/patient/patient's daughter  Prev Dx/Sx: schizoaffective disorder, bipolar, schizophrenia, substance-induced mood disorder/psychosis, alcohol  use disorder severe, polysubstance abuse (I.e., though largely crack cocaine), and MDD Current Psych Provider: None Home Meds (current): None Previous Med Trials: Multiple, most recent-Depakote , Abilify , Remeron  Therapy: None  Prior Psych Hospitalization: Multiple Prior Self Harm: Yes, multiple suicide attempts, most recent 2025 tried to overdose; most serious attempt, 2023 overdose which led to intensive care unit stay Prior Violence: Yes, frequently  Family Psych History: None reported Family Hx suicide: None reported  Social History:  Developmental Hx: None reported Educational Hx: None reported Occupational Hx: SSDI Legal Hx: Multiple involuntary commitments; has a POA (daughter) Living Situation: Lives alone, but mother down the street, and daughters visit often Spiritual Hx: None reported Access to weapons/lethal means: None reported  Substance History Alcohol : Daily  Type of alcohol : Malt liquor Last Drink: Unknown Number of drinks per day: two or three forty ounces per day History of alcohol  withdrawal seizures: None reported History of  DT's : None reported Tobacco: Daily Illicit drugs: Cocaine, not recently, per family Prescription drug abuse: None reported Rehab hx: None reported  Exam Findings  Physical Exam: As below Vital Signs:  Temp:  [97.6 F (36.4 C)] 97.6 F (36.4 C) (08/22 1332) Pulse Rate:  [110] 110 (08/22 1332) Resp:  [25] 25 (08/22 1332) BP: (122)/(106) 122/106 (08/22 1332) SpO2:  [89 %] 89 % (08/22 1332) Blood pressure (!) 122/106, pulse (!) 110, temperature 97.6 F (36.4 C), temperature source Tympanic, resp. rate (!) 25, last menstrual period 01/08/2011, SpO2 (!) 89%. There is no height or weight on file to calculate BMI.  Physical Exam Vitals and nursing note reviewed.  Constitutional:      General: She is not in acute distress.    Appearance: She is obese. She is not ill-appearing, toxic-appearing or diaphoretic.     Comments: Agitated, aggressive, and intently related interpersonal style  Pulmonary:     Effort: Pulmonary effort is normal.  Neurological:     Mental Status: She is alert.     Motor: No tremor or seizure activity.     Comments: Grossly intact   Psychiatric:        Attention and Perception: Attention and perception normal.        Mood and Affect: Affect is angry.        Speech: Speech is rapid and pressured.        Behavior: Behavior is uncooperative, agitated, aggressive, hyperactive and combative.  Thought Content: Thought content is paranoid. Thought content does not include homicidal or suicidal ideation.        Judgment: Judgment is impulsive and inappropriate.     Comments: Mood: Angry Cognition and Memory: Grossly intact     Mental Status Exam: General Appearance: Severely agitated mildly obese African-American female with casual clothing and fair grooming  Orientation:  Other:  Grossly intact, refuses to answer formal orientation questions  Memory: Grossly intact  Concentration: Grossly intact   Recall:  Grossly intact  Attention  Other: Grossly intact   Eye Contact: Variable to intense  Speech:  Pressured and rapid  Language:  Fair  Volume:  Increased, yelling  Mood: Fuck all of ya'll right now!   Affect:  Congruent  Thought Process:  Coherent, Goal Directed, and Linear  Thought Content: Logical with paranoid expressions around family setting me up  Suicidal Thoughts:  No  Homicidal Thoughts:  No  Judgement:  Other:  Acutely impaired  Insight:  Lacking  Psychomotor Activity:  Increased and Restlessness  Akathisia:  No  Fund of Knowledge:  Grossly intact      Assets:  Architect Housing Leisure Time Physical Health Resilience Social Support Talents/Skills Transportation Vocational/Educational  Cognition:  WNL  ADL's:  Intact  AIMS (if indicated):   0     Other History   These have been pulled in through the EMR, reviewed, and updated if appropriate.  Family History:  The patient's family history includes COPD in an other family member; Diabetes in her mother; Heart attack in her father and sister; Heart disease in her mother; Hypertension in her mother.  Medical History: Past Medical History:  Diagnosis Date  . Abdominal pain   . Accidental drug overdose April 2013  . Anxiety   . Atrial fibrillation (HCC) 09/29/11   converted spontaneously  . Chronic back pain   . Chronic knee pain   . Chronic nausea   . Chronic pain   . COPD (chronic obstructive pulmonary disease) (HCC)   . Depression   . Diabetes mellitus    states her doctor took her off all DM meds in past month  . Diabetic neuropathy (HCC)   . Dyspnea    with exertion   . GERD (gastroesophageal reflux disease)   . Headache(784.0)    migraines   . HTN (hypertension)    not on meds since in a year   . Hyperlipidemia   . Hypothyroidism    not on meds in a while   . Mental disorder    Bipolar and schizophrenic  . Nausea and vomiting 01/02/2023  . Requires supplemental oxygen     as needed per patient   .  Schizophrenia (HCC)   . Schizophrenia, acute (HCC) 11/13/2017  . Tobacco abuse     Surgical History: Past Surgical History:  Procedure Laterality Date  . ABDOMINAL HYSTERECTOMY    . BLADDER SUSPENSION  03/04/2011   Procedure: [facility] PROCEDURE;  Surgeon: Glendia LABOR MacDiarmid;  Location: [facility];  Service: Urology;  Laterality: N/A;  . BOWEL RESECTION N/A 04/18/2022   Procedure: SMALL BOWEL RESECTION;  Surgeon: Vanderbilt Ned, MD;  Location: [facility] OR;  Service: General;  Laterality: N/A;  . CYSTOCELE REPAIR  03/04/2011   Procedure: ANTERIOR REPAIR (CYSTOCELE);  Surgeon: Glendia LABOR Elizabeth;  Location: [facility];  Service: Urology;  Laterality: N/A;  . CYSTOSCOPY  03/04/2011   Procedure: CYSTOSCOPY;  Surgeon: Glendia LABOR MacDiarmid;  Location: [facility];  Service: Urology;  Laterality:  N/A;  . ESOPHAGOGASTRODUODENOSCOPY (EGD) WITH PROPOFOL  N/A 05/12/2017   Procedure: ESOPHAGOGASTRODUODENOSCOPY (EGD) WITH PROPOFOL ;  Surgeon: Ethyl Lenis, MD;  Location: [facility];  Service: General;  Laterality: N/A;  . GASTRIC ROUX-EN-Y N/A 03/25/2016   Procedure: LAPAROSCOPIC ROUX-EN-Y GASTRIC BYPASS WITH UPPER ENDOSCOPY;  Surgeon: Morene Olives, MD;  Location: [facility];  Service: General;  Laterality: N/A;  . KNEE SURGERY    . LAPAROSCOPIC ASSISTED VAGINAL HYSTERECTOMY  03/04/2011   Procedure: LAPAROSCOPIC ASSISTED VAGINAL HYSTERECTOMY;  Surgeon: Rosaline LITTIE Cobble, MD;  Location: [facility];  Service: Gynecology;  Laterality: N/A;  . LAPAROTOMY N/A 04/18/2022   Procedure: EXPLORATORY LAPAROTOMY;  Surgeon: Vanderbilt Ned, MD;  Location: [facility];  Service: General;  Laterality: N/A;  . LAPAROTOMY N/A 04/24/2022   Procedure: BRING BACK EXPLORATORY LAPAROTOMY;  Surgeon: Sebastian Moles, MD;  Location: [facility];  Service: General;  Laterality: N/A;  . TOTAL HIP ARTHROPLASTY Right 08/27/2022   Procedure: TOTAL HIP ARTHROPLASTY;  Surgeon: Edna Toribio LABOR, MD;  Location: [facility];  Service: Orthopedics;  Laterality: Right;      Medications:  No current facility-administered medications for this encounter.  Current Outpatient Medications:  .  albuterol  (VENTOLIN  HFA) 108 (90 Base) MCG/ACT inhaler, Inhale 2 puffs into the lungs every 4 (four) hours as needed for wheezing or shortness of breath., Disp: 18 g, Rfl: 1 .  OXYGEN , Inhale 4-5 L/min into the lungs daily as needed (for COPD-related symptoms)., Disp: , Rfl:  .  predniSONE  (DELTASONE ) 20 MG tablet, Take 2 tablets (40 mg total) by mouth daily for 4 days, Disp: 8 tablet, Rfl: 0 .  ARIPiprazole  (ABILIFY ) 10 MG tablet, Take 1 tablet (10 mg total) by mouth daily. (Patient not taking: Reported on 02/12/2024), Disp: 90 tablet, Rfl: 1 .  cyclobenzaprine  (FLEXERIL ) 5 MG tablet, Take 1 tablet (5 mg total) by mouth 3 (three) times daily as needed for muscle spasms. (Patient not taking: Reported on 02/12/2024), Disp: 30 tablet, Rfl: 0 .  divalproex  (DEPAKOTE ) 500 MG DR tablet, Take 500 mg by mouth 2 (two) times daily. (Patient not taking: Reported on 02/12/2024), Disp: , Rfl:  .  fluticasone  furoate-vilanterol (BREO ELLIPTA ) 100-25 MCG/ACT AEPB, Inhale 1 puff into the lungs daily. (Patient not taking: No sig reported), Disp: 60 each, Rfl: 3 .  folic acid  (FOLVITE ) 1 MG tablet, Take 1 tablet (1 mg total) by mouth daily. (Patient not taking: Reported on 02/12/2024), Disp: 90 tablet, Rfl: 1 .  ipratropium-albuterol  (DUONEB) 0.5-2.5 (3) MG/3ML SOLN, Take 3 mLs by nebulization every 6 (six) hours as needed (for shortness of breath or wheezing). (Patient not taking: Reported on 02/12/2024), Disp: 360 mL, Rfl: 1 .  levETIRAcetam  (KEPPRA  XR) 500 MG 24 hr tablet, Take 1 tablet (500 mg total) by mouth 2 (two) times daily. (Patient not taking: Reported on 02/12/2024), Disp: 180 tablet, Rfl: 1 .  metFORMIN  (GLUCOPHAGE -XR) 750 MG 24 hr tablet, Take 1 tablet (750 mg total) by mouth daily with breakfast. (Patient not taking: Reported on 02/12/2024), Disp: 90 tablet, Rfl: 1 .  metoprolol  tartrate  (LOPRESSOR ) 25 MG tablet, Take 1 tablet (25 mg total) by mouth 2 (two) times daily. (Patient not taking: Reported on 02/12/2024), Disp: 180 tablet, Rfl: 1 .  nicotine  (NICODERM CQ  - DOSED IN MG/24 HOURS) 21 mg/24hr patch, Place 1 patch (21 mg total) onto the skin daily. (Patient not taking: Reported on 02/12/2024), Disp: 28 patch, Rfl: 1 .  pantoprazole  (PROTONIX ) 40 MG tablet, Take 1 tablet (40 mg total) by mouth  daily. (Patient not taking: Reported on 02/12/2024), Disp: 90 tablet, Rfl: 1 .  thiamine  (VITAMIN B-1) 100 MG tablet, Take 1 tablet (100 mg total) by mouth daily. (Patient not taking: No sig reported), Disp: 30 tablet, Rfl: 1 .  umeclidinium bromide  (INCRUSE ELLIPTA ) 62.5 MCG/ACT AEPB, Inhale 1 puff into the lungs daily. (Patient not taking: Reported on 02/12/2024), Disp: 30 each, Rfl: 1 .  zonisamide  (ZONEGRAN ) 100 MG capsule, Take 2 capsules (200 mg total) by mouth daily. (Patient not taking: Reported on 02/12/2024), Disp: 90 capsule, Rfl: 1  Allergies: Allergies  Allergen Reactions  . Iron  Dextran Shortness Of Breath and Anxiety  . Aspirin  Nausea And Vomiting and Other (See Comments)    Ok to take tylenol  or ibuprofen    . Penicillins Other (See Comments)    Unknown reaction from childhood: family would like this to remain as an allergy.  Tolerated cephalosporins and zosyn     [name] JINNY Gravely, NP

## 2024-02-12 NOTE — ED Provider Notes (Signed)
 [place] EMERGENCY DEPARTMENT AT [facility] Provider Note   CSN: [id number] Arrival date & time: 02/12/24  1316     Patient presents with: Psychiatric Evaluation and Drug Overdose   [name] is a 58 y.o. female.   58 yo F with a chief complaints of unresponsiveness.  This was reported by the family.  They went to check on her today and realized that all of her doors were locked.  Typically she leaves her front door open.  Eventually someone was able to get in through the window and they found that her pillbox was empty.  There is some concern that maybe she had intentionally overdosed to kill herself.  IVC paperwork was filled out.  Patient is acutely agitated.  She is quite upset that she is being brought here against her will by police in handcuffs.  She is yelling and screaming and spitting at staff.  She tells me that her family made this up about her.        Prior to Admission medications   Medication Sig Start Date End Date Taking? Authorizing Provider  albuterol  (VENTOLIN  HFA) 108 (90 Base) MCG/ACT inhaler Inhale 2 puffs into the lungs every 4 (four) hours as needed for wheezing or shortness of breath. 02/05/24  Yes Medina-Vargas, Monina C, NP  OXYGEN  Inhale 4-5 L/min into the lungs daily as needed (for COPD-related symptoms).   Yes [provider]  predniSONE  (DELTASONE ) 20 MG tablet Take 2 tablets (40 mg total) by mouth daily for 4 days 02/02/24  Yes Johnie Flaming A, NP  ARIPiprazole  (ABILIFY ) 10 MG tablet Take 1 tablet (10 mg total) by mouth daily. Patient not taking: Reported on 02/12/2024 02/05/24   Medina-Vargas, Monina C, NP  cyclobenzaprine  (FLEXERIL ) 5 MG tablet Take 1 tablet (5 mg total) by mouth 3 (three) times daily as needed for muscle spasms. Patient not taking: Reported on 02/12/2024 02/10/24   Armenta Canning, MD  divalproex  (DEPAKOTE ) 500 MG DR tablet Take 500 mg by mouth 2 (two) times daily. Patient not taking: Reported on 02/12/2024     [provider]  fluticasone  furoate-vilanterol (BREO ELLIPTA ) 100-25 MCG/ACT AEPB Inhale 1 puff into the lungs daily. Patient not taking: No sig reported 02/05/24   Medina-Vargas, Monina C, NP  folic acid  (FOLVITE ) 1 MG tablet Take 1 tablet (1 mg total) by mouth daily. Patient not taking: Reported on 02/12/2024 02/05/24   Medina-Vargas, Monina C, NP  ipratropium-albuterol  (DUONEB) 0.5-2.5 (3) MG/3ML SOLN Take 3 mLs by nebulization every 6 (six) hours as needed (for shortness of breath or wheezing). Patient not taking: Reported on 02/12/2024 02/05/24   Medina-Vargas, Monina C, NP  levETIRAcetam  (KEPPRA  XR) 500 MG 24 hr tablet Take 1 tablet (500 mg total) by mouth 2 (two) times daily. Patient not taking: Reported on 02/12/2024 02/05/24   Medina-Vargas, Monina C, NP  metFORMIN  (GLUCOPHAGE -XR) 750 MG 24 hr tablet Take 1 tablet (750 mg total) by mouth daily with breakfast. Patient not taking: Reported on 02/12/2024 02/05/24   Medina-Vargas, Monina C, NP  metoprolol  tartrate (LOPRESSOR ) 25 MG tablet Take 1 tablet (25 mg total) by mouth 2 (two) times daily. Patient not taking: Reported on 02/12/2024 02/05/24   Medina-Vargas, Monina C, NP  nicotine  (NICODERM CQ  - DOSED IN MG/24 HOURS) 21 mg/24hr patch Place 1 patch (21 mg total) onto the skin daily. Patient not taking: Reported on 02/12/2024 02/05/24   Medina-Vargas, Monina C, NP  pantoprazole  (PROTONIX ) 40 MG tablet Take 1 tablet (40  mg total) by mouth daily. Patient not taking: Reported on 02/12/2024 02/05/24   Medina-Vargas, Monina C, NP  thiamine  (VITAMIN B-1) 100 MG tablet Take 1 tablet (100 mg total) by mouth daily. Patient not taking: No sig reported 11/14/23   Raenelle Donalda HERO, MD  umeclidinium bromide  (INCRUSE ELLIPTA ) 62.5 MCG/ACT AEPB Inhale 1 puff into the lungs daily. Patient not taking: Reported on 02/12/2024 02/05/24   Medina-Vargas, Monina C, NP  zonisamide  (ZONEGRAN ) 100 MG capsule Take 2 capsules (200 mg total) by mouth daily. Patient not  taking: Reported on 02/12/2024 02/05/24   Medina-Vargas, Monina C, NP    Allergies: Iron  dextran, Aspirin , and Penicillins    Review of Systems  Updated Vital Signs BP (!) 122/106 (BP Location: Right Arm)   Pulse (!) 110   Temp 97.6 F (36.4 C) (Tympanic)   Resp (!) 25   LMP 01/08/2011   SpO2 (!) 89%   Physical Exam Vitals and nursing note reviewed.  Constitutional:      General: She is not in acute distress.    Appearance: She is well-developed. She is not diaphoretic.  HENT:     Head: Normocephalic and atraumatic.  Eyes:     Pupils: Pupils are equal, round, and reactive to light.  Cardiovascular:     Rate and Rhythm: Normal rate and regular rhythm.     Heart sounds: No murmur heard.    No friction rub. No gallop.  Pulmonary:     Effort: Pulmonary effort is normal.     Breath sounds: No wheezing or rales.  Abdominal:     General: There is no distension.     Palpations: Abdomen is soft.     Tenderness: There is no abdominal tenderness.  Musculoskeletal:        General: No tenderness.     Cervical back: Normal range of motion and neck supple.  Skin:    General: Skin is warm and dry.  Neurological:     Mental Status: She is alert and oriented to person, place, and time.  Psychiatric:        Behavior: Behavior normal.     (all labs ordered are listed, but only abnormal results are displayed) Labs Reviewed  COMPREHENSIVE METABOLIC PANEL WITH GFR - Abnormal; Notable for the following components:      Result Value   Glucose, Bld 149 (*)    BUN 5 (*)    All other components within normal limits  CBC WITH DIFFERENTIAL/PLATELET - Abnormal; Notable for the following components:   Hemoglobin 10.8 (*)    MCV 76.4 (*)    MCH 22.6 (*)    MCHC 29.6 (*)    RDW 21.2 (*)    All other components within normal limits  ACETAMINOPHEN  LEVEL - Abnormal; Notable for the following components:   Acetaminophen  (Tylenol ), Serum <10 (*)    All other components within normal limits   SALICYLATE LEVEL - Abnormal; Notable for the following components:   Salicylate Lvl <7.0 (*)    All other components within normal limits  ETHANOL  RAPID URINE DRUG SCREEN, HOSP PERFORMED    EKG: EKG Interpretation Date/Time:  Friday February 12 2024 14:15:30 EDT Ventricular Rate:  103 PR Interval:  142 QRS Duration:  93 QT Interval:  337 QTC Calculation: 442 R Axis:   61  Text Interpretation: Sinus tachycardia Biatrial enlargement RSR' in V1 or V2, probably normal variant No significant change since last tracing Confirmed by Emil Share [phone] on 02/12/2024 2:29:51 PM  Radiology: No results found.    Procedures   Medications Ordered in the ED  OLANZapine  (ZYPREXA ) injection 10 mg (10 mg Intramuscular Given 02/12/24 1337)  sterile water  (preservative free) injection (2.1 mLs  Given 02/12/24 1337)  midazolam  (VERSED ) injection 2 mg (2 mg Intramuscular Given 02/12/24 1343)                                    Medical Decision Making Amount and/or Complexity of Data Reviewed Labs: ordered.  Risk Prescription drug management.   58 yo F well-known to this emergency department with 32 visits in the past 6 months comes in with a chief complaint of suicidal ideation.  This was presumed by family.  They felt that the patient does not normally lock all of her doors and her pill box was completely empty that maybe she had intentionally overdosed to kill herself.  She also was reportedly unresponsive.  On arrival here the patient is awake and alert and very upset that she is being brought in by police in handcuffs.  I tried to encourage the patient to remain calm to try and talk it over with TTS prior to being discharged.  She unfortunately started trying to harm staff, for the safety of her and staff will chemically sedate.  Patient's lab work is resulted without significant finding, EtOH negative, no significant electrolyte abnormalities.  No acute anemia.  I feel she is medically clear  for TTS evaluation.  The patients results and plan were reviewed and discussed.   Any x-rays performed were independently reviewed by myself.   Differential diagnosis were considered with the presenting HPI.  Medications  OLANZapine  (ZYPREXA ) injection 10 mg (10 mg Intramuscular Given 02/12/24 1337)  sterile water  (preservative free) injection (2.1 mLs  Given 02/12/24 1337)  midazolam  (VERSED ) injection 2 mg (2 mg Intramuscular Given 02/12/24 1343)    Vitals:   02/12/24 1332  BP: (!) 122/106  Pulse: (!) 110  Resp: (!) 25  Temp: 97.6 F (36.4 C)  TempSrc: Tympanic  SpO2: (!) 89%    Final diagnoses:  Suicidal ideation        Final diagnoses:  Suicidal ideation    ED Discharge Orders     None          Emil Share, DO 02/12/24 1504

## 2024-02-12 NOTE — ED Triage Notes (Signed)
 Pts daughter did a well fair check on patient. Pt barricaded herself in the house and all the pills were gone, Pt was initially close to being unresponsive per [employer]. Pt brought by [employer] in hand cuffs being erratic/ verbal threats/ cursing.

## 2024-02-12 NOTE — ED Notes (Signed)
 IVC'ED IN THE FIELD  BY MAGISTRATE  WAITING FOR DR. TO COMPLETE THE 1ST EXAM

## 2024-02-12 NOTE — ED Notes (Signed)
 All belongings placed in patient belonging bag. Lighter, keys, phone, fanny pack, keys, shirt, bra, pants, underwear, socks, and shoes.

## 2024-02-12 NOTE — ED Notes (Signed)
 Personal belonging in locker 2. Phone ( [employer]), keys, 5 lighters and 2 pack of [place] cigarettes in security. One of the pack is already open.

## 2024-02-12 NOTE — ED Notes (Signed)
 IVC paperwork attached to the clip board in [place] nadine is aware.

## 2024-02-13 ENCOUNTER — Inpatient Hospital Stay (HOSPITAL_COMMUNITY)
Admission: AD | Admit: 2024-02-13 | Discharge: 2024-02-14 | DRG: 885 | Disposition: A | Discharge status: Other Acute Inpatient Hospital | Source: Intra-hospital

## 2024-02-13 DIAGNOSIS — Z91148 Patient's other noncompliance with medication regimen for other reason: Secondary | ICD-10-CM | POA: Diagnosis not present

## 2024-02-13 DIAGNOSIS — Z833 Family history of diabetes mellitus: Secondary | ICD-10-CM

## 2024-02-13 DIAGNOSIS — Z96641 Presence of right artificial hip joint: Secondary | ICD-10-CM | POA: Diagnosis present

## 2024-02-13 DIAGNOSIS — Z1152 Encounter for screening for COVID-19: Secondary | ICD-10-CM

## 2024-02-13 DIAGNOSIS — I10 Essential (primary) hypertension: Secondary | ICD-10-CM | POA: Diagnosis present

## 2024-02-13 DIAGNOSIS — Z7289 Other problems related to lifestyle: Secondary | ICD-10-CM

## 2024-02-13 DIAGNOSIS — Z9151 Personal history of suicidal behavior: Secondary | ICD-10-CM | POA: Diagnosis not present

## 2024-02-13 DIAGNOSIS — Z91410 Personal history of adult physical and sexual abuse: Secondary | ICD-10-CM | POA: Diagnosis not present

## 2024-02-13 DIAGNOSIS — Z7984 Long term (current) use of oral hypoglycemic drugs: Secondary | ICD-10-CM | POA: Diagnosis not present

## 2024-02-13 DIAGNOSIS — F1020 Alcohol dependence, uncomplicated: Secondary | ICD-10-CM | POA: Diagnosis present

## 2024-02-13 DIAGNOSIS — F10239 Alcohol dependence with withdrawal, unspecified: Secondary | ICD-10-CM | POA: Diagnosis present

## 2024-02-13 DIAGNOSIS — Z9884 Bariatric surgery status: Secondary | ICD-10-CM | POA: Diagnosis not present

## 2024-02-13 DIAGNOSIS — F319 Bipolar disorder, unspecified: Secondary | ICD-10-CM | POA: Diagnosis present

## 2024-02-13 DIAGNOSIS — E785 Hyperlipidemia, unspecified: Secondary | ICD-10-CM | POA: Diagnosis present

## 2024-02-13 DIAGNOSIS — T50902A Poisoning by unspecified drugs, medicaments and biological substances, intentional self-harm, initial encounter: Secondary | ICD-10-CM | POA: Diagnosis present

## 2024-02-13 DIAGNOSIS — E1140 Type 2 diabetes mellitus with diabetic neuropathy, unspecified: Secondary | ICD-10-CM | POA: Diagnosis present

## 2024-02-13 DIAGNOSIS — Z79899 Other long term (current) drug therapy: Secondary | ICD-10-CM | POA: Diagnosis not present

## 2024-02-13 DIAGNOSIS — G40909 Epilepsy, unspecified, not intractable, without status epilepticus: Secondary | ICD-10-CM | POA: Diagnosis present

## 2024-02-13 DIAGNOSIS — Z634 Disappearance and death of family member: Secondary | ICD-10-CM

## 2024-02-13 DIAGNOSIS — F259 Schizoaffective disorder, unspecified: Secondary | ICD-10-CM | POA: Diagnosis present

## 2024-02-13 DIAGNOSIS — J449 Chronic obstructive pulmonary disease, unspecified: Secondary | ICD-10-CM | POA: Diagnosis present

## 2024-02-13 DIAGNOSIS — Z825 Family history of asthma and other chronic lower respiratory diseases: Secondary | ICD-10-CM | POA: Diagnosis not present

## 2024-02-13 DIAGNOSIS — Y636 Underdosing and nonadministration of necessary drug, medicament or biological substance: Secondary | ICD-10-CM | POA: Diagnosis present

## 2024-02-13 DIAGNOSIS — Z886 Allergy status to analgesic agent status: Secondary | ICD-10-CM

## 2024-02-13 DIAGNOSIS — F329 Major depressive disorder, single episode, unspecified: Secondary | ICD-10-CM | POA: Diagnosis present

## 2024-02-13 DIAGNOSIS — F1990 Other psychoactive substance use, unspecified, uncomplicated: Secondary | ICD-10-CM | POA: Diagnosis not present

## 2024-02-13 DIAGNOSIS — F17210 Nicotine dependence, cigarettes, uncomplicated: Secondary | ICD-10-CM | POA: Diagnosis present

## 2024-02-13 DIAGNOSIS — F603 Borderline personality disorder: Principal | ICD-10-CM | POA: Diagnosis present

## 2024-02-13 DIAGNOSIS — Z8249 Family history of ischemic heart disease and other diseases of the circulatory system: Secondary | ICD-10-CM | POA: Diagnosis not present

## 2024-02-13 DIAGNOSIS — Z90710 Acquired absence of both cervix and uterus: Secondary | ICD-10-CM | POA: Diagnosis not present

## 2024-02-13 DIAGNOSIS — Z880 Allergy status to penicillin: Secondary | ICD-10-CM

## 2024-02-13 DIAGNOSIS — I4891 Unspecified atrial fibrillation: Secondary | ICD-10-CM | POA: Diagnosis present

## 2024-02-13 DIAGNOSIS — Z781 Physical restraint status: Secondary | ICD-10-CM

## 2024-02-13 DIAGNOSIS — Z9152 Personal history of nonsuicidal self-harm: Secondary | ICD-10-CM

## 2024-02-13 DIAGNOSIS — F419 Anxiety disorder, unspecified: Secondary | ICD-10-CM | POA: Diagnosis present

## 2024-02-13 DIAGNOSIS — K219 Gastro-esophageal reflux disease without esophagitis: Secondary | ICD-10-CM | POA: Diagnosis present

## 2024-02-13 DIAGNOSIS — Z9049 Acquired absence of other specified parts of digestive tract: Secondary | ICD-10-CM

## 2024-02-13 DIAGNOSIS — Z888 Allergy status to other drugs, medicaments and biological substances status: Secondary | ICD-10-CM

## 2024-02-13 DIAGNOSIS — E039 Hypothyroidism, unspecified: Secondary | ICD-10-CM | POA: Diagnosis present

## 2024-02-13 DIAGNOSIS — X58XXXA Exposure to other specified factors, initial encounter: Secondary | ICD-10-CM | POA: Diagnosis present

## 2024-02-13 DIAGNOSIS — R45851 Suicidal ideations: Secondary | ICD-10-CM | POA: Diagnosis present

## 2024-02-13 DIAGNOSIS — G3184 Mild cognitive impairment, so stated: Secondary | ICD-10-CM | POA: Diagnosis present

## 2024-02-13 DIAGNOSIS — Z723 Lack of physical exercise: Secondary | ICD-10-CM | POA: Diagnosis not present

## 2024-02-13 DIAGNOSIS — F17213 Nicotine dependence, cigarettes, with withdrawal: Secondary | ICD-10-CM | POA: Diagnosis present

## 2024-02-13 DIAGNOSIS — Z7951 Long term (current) use of inhaled steroids: Secondary | ICD-10-CM

## 2024-02-13 MED ORDER — CHLORDIAZEPOXIDE HCL 25 MG PO CAPS: 25.0000 mg | ORAL_CAPSULE | Freq: Every day | ORAL | Status: DC

## 2024-02-13 MED ORDER — CHLORDIAZEPOXIDE HCL 25 MG PO CAPS: 25.0000 mg | ORAL_CAPSULE | Freq: Three times a day (TID) | ORAL | Status: DC

## 2024-02-13 MED ORDER — METOPROLOL TARTRATE 25 MG PO TABS
25.0000 mg | ORAL_TABLET | Freq: Two times a day (BID) | ORAL | Status: DC
  Administered 2024-02-13: 25 mg via ORAL
  Filled 2024-02-13 (×2): qty 1

## 2024-02-13 MED ORDER — VITAMIN B-1 100 MG PO TABS
100.0000 mg | ORAL_TABLET | Freq: Every day | ORAL | Status: DC
  Filled 2024-02-13: qty 1

## 2024-02-13 MED ORDER — METFORMIN HCL ER 750 MG PO TB24
750.0000 mg | ORAL_TABLET | Freq: Every day | ORAL | Status: DC
  Filled 2024-02-13: qty 1

## 2024-02-13 MED ORDER — CHLORDIAZEPOXIDE HCL 25 MG PO CAPS: 25.0000 mg | ORAL_CAPSULE | Freq: Four times a day (QID) | ORAL | Status: DC | PRN

## 2024-02-13 MED ORDER — ARIPIPRAZOLE 5 MG PO TABS
5.0000 mg | ORAL_TABLET | Freq: Every day | ORAL | Status: DC
Start: 2024-02-14 — End: 2024-02-14
  Filled 2024-02-13: qty 1

## 2024-02-13 MED ORDER — ZIPRASIDONE MESYLATE 20 MG IM SOLR: 20.0000 mg | Freq: Once | INTRAMUSCULAR | Status: DC | PRN

## 2024-02-13 MED ORDER — METOPROLOL TARTRATE 25 MG PO TABS
25.0000 mg | ORAL_TABLET | Freq: Two times a day (BID) | ORAL | Status: DC
  Filled 2024-02-13: qty 1

## 2024-02-13 MED ORDER — CHLORDIAZEPOXIDE HCL 25 MG PO CAPS
25.0000 mg | ORAL_CAPSULE | Freq: Four times a day (QID) | ORAL | Status: DC
  Administered 2024-02-13: 25 mg via ORAL
  Filled 2024-02-13 (×2): qty 1

## 2024-02-13 MED ORDER — LEVETIRACETAM ER 500 MG PO TB24
500.0000 mg | ORAL_TABLET | Freq: Two times a day (BID) | ORAL | Status: DC
  Filled 2024-02-13 (×2): qty 1

## 2024-02-13 MED ORDER — ONDANSETRON 4 MG PO TBDP: 4.0000 mg | ORAL_TABLET | Freq: Four times a day (QID) | ORAL | Status: DC | PRN

## 2024-02-13 MED ORDER — HYDROXYZINE HCL 25 MG PO TABS: 25.0000 mg | ORAL_TABLET | Freq: Four times a day (QID) | ORAL | Status: DC | PRN

## 2024-02-13 MED ORDER — CHLORDIAZEPOXIDE HCL 25 MG PO CAPS: 25.0000 mg | ORAL_CAPSULE | ORAL | Status: DC

## 2024-02-13 MED ORDER — ACETAMINOPHEN 325 MG PO TABS
650.0000 mg | ORAL_TABLET | Freq: Four times a day (QID) | ORAL | Status: DC | PRN
  Administered 2024-02-14: 650 mg via ORAL
  Filled 2024-02-13: qty 2

## 2024-02-13 MED ORDER — ALUM & MAG HYDROXIDE-SIMETH 200-200-20 MG/5ML PO SUSP: 30.0000 mL | ORAL | Status: DC | PRN

## 2024-02-13 MED ORDER — NICOTINE 21 MG/24HR TD PT24
21.0000 mg | MEDICATED_PATCH | Freq: Once | TRANSDERMAL | Status: DC
Start: 2024-02-13 — End: 2024-02-13
  Administered 2024-02-13: 21 mg via TRANSDERMAL
  Filled 2024-02-13: qty 1

## 2024-02-13 MED ORDER — ALBUTEROL SULFATE HFA 108 (90 BASE) MCG/ACT IN AERS: 2.0000 | INHALATION_SPRAY | RESPIRATORY_TRACT | Status: DC | PRN

## 2024-02-13 MED ORDER — ALBUTEROL SULFATE HFA 108 (90 BASE) MCG/ACT IN AERS
2.0000 | INHALATION_SPRAY | RESPIRATORY_TRACT | Status: DC | PRN
Start: 2024-02-13 — End: 2024-02-14
  Administered 2024-02-14: 2 via RESPIRATORY_TRACT

## 2024-02-13 MED ORDER — LOPERAMIDE HCL 2 MG PO CAPS: 2.0000 mg | ORAL_CAPSULE | ORAL | Status: DC | PRN

## 2024-02-13 MED ORDER — METFORMIN HCL ER 750 MG PO TB24
750.0000 mg | ORAL_TABLET | Freq: Every day | ORAL | Status: DC
  Filled 2024-02-13 (×2): qty 1

## 2024-02-13 MED ORDER — CHLORDIAZEPOXIDE HCL 25 MG PO CAPS
50.0000 mg | ORAL_CAPSULE | Freq: Once | ORAL | Status: DC
  Filled 2024-02-13: qty 2

## 2024-02-13 MED ORDER — OLANZAPINE 5 MG PO TBDP: 5.0000 mg | ORAL_TABLET | Freq: Three times a day (TID) | ORAL | Status: DC | PRN

## 2024-02-13 MED ORDER — NICOTINE POLACRILEX 2 MG MT GUM
2.0000 mg | CHEWING_GUM | OROMUCOSAL | Status: DC | PRN
  Administered 2024-02-13: 2 mg via ORAL
  Filled 2024-02-13: qty 1

## 2024-02-13 MED ORDER — LEVETIRACETAM ER 500 MG PO TB24
500.0000 mg | ORAL_TABLET | Freq: Two times a day (BID) | ORAL | Status: DC
  Filled 2024-02-13 (×3): qty 1

## 2024-02-13 MED ORDER — OLANZAPINE 10 MG IM SOLR: 5.0000 mg | Freq: Three times a day (TID) | INTRAMUSCULAR | Status: DC | PRN

## 2024-02-13 MED ORDER — KETOROLAC TROMETHAMINE 60 MG/2ML IM SOLN
30.0000 mg | Freq: Once | INTRAMUSCULAR | Status: AC
  Administered 2024-02-13: 30 mg via INTRAMUSCULAR
  Filled 2024-02-13: qty 2

## 2024-02-13 MED ORDER — MAGNESIUM HYDROXIDE 400 MG/5ML PO SUSP: 30.0000 mL | Freq: Every day | ORAL | Status: DC | PRN

## 2024-02-13 MED ORDER — ZIPRASIDONE MESYLATE 20 MG IM SOLR: 20.0000 mg | Freq: Once | INTRAMUSCULAR | Status: DC

## 2024-02-13 NOTE — ED Notes (Signed)
 Pt asked RN to her room to discuss disposition.  Pt informed that as she was told this AM on rounds, she is being referred for inpatient.  Pt informed that she has been assigned a bed at [facility] and that she will be transported later today. Pt becomes frustrated and begins to talk rapidly about her perception of the events that got her here.  Pt encouraged to discuss things with psychiatry when she arrives at [facility].  Pt verbalizes understanding and compliance at this time.

## 2024-02-13 NOTE — ED Notes (Signed)
 Pt given stronger nicotine  patch.  Pt offered daily medications and librium  for anxiety.  Pt states she will not take anything from anyone, she does not take medications from liars.  Pt states she will take medications when she gets home since that's what got me here in the first place.

## 2024-02-13 NOTE — Progress Notes (Signed)
 [facility] LCSW Progress Note   02/13/2024    11:08 AM  [name]   [id number]   Type of Contact and Topic:  Psychiatric Bed Placement   Pt accepted to [facility] 304-2    Patient meets inpatient criteria per Jerel Gravely, NP   The attending provider will be Dr. Oliva Salmon  Call report to [phone]    Delon Side, RN @ [facility] ED notified.     Pt scheduled  to arrive at [facility] TODAY @ 2100.    Bunnie Gallop, MSW, LCSW-A  11:09 AM 02/13/2024

## 2024-02-13 NOTE — ED Notes (Signed)
 [facility] called for pt transport to [facility]

## 2024-02-13 NOTE — ED Notes (Signed)
 Another pt was in hallway acting out when pt began to raise her voice to match his, pt begins to talk about everyone her lying to her and that she was told she was going home.  Pt again continues to relay her perception of events that led her here and why she should be released.  Pt over talking staff and expresses aggravation at being sent to [facility], states she has been there before and they don't help.  Security in room talking with pt, conversation continues to progress in circles.  This RN left room to attend to other pt.

## 2024-02-13 NOTE — ED Notes (Signed)
 IVC'd 02/12/24, exp 02/19/24

## 2024-02-13 NOTE — ED Notes (Signed)
 Pt declined to take medication. Pt states she is being lied to about when she is being discharged. RN informed pt there is a room available but it will not be ready until after 20:00. Pt states she will just take her medication when she get home since she won't be leaving anytime soon.

## 2024-02-13 NOTE — Consult Note (Cosign Needed Addendum)
 [facility] Psychiatric Consult Follow-up  Patient Name: .[name]  MRN: [id number]  DOB: 1965/07/19  Consult Order details:  Orders (From admission, onward)     Start     Ordered   02/12/24 1331  CONSULT TO CALL ACT TEAM       Ordering Provider: Emil Share, DO  Provider:  (Not yet assigned)  Question:  Reason for Consult?  Answer:  Psych consult   02/12/24 1330   02/12/24 1331  IP CONSULT TO PSYCHIATRY       Ordering Provider: Emil Share, DO  Provider:  (Not yet assigned)  Question:  Reason for consult:  Answer:  Medication management   02/12/24 1330             Mode of Visit: In person    Psychiatry Consult Evaluation  Service Date: February 13, 2024 LOS:  LOS: 0 days  Chief Complaint: Concern for suicide attempt by overdose  Primary Psychiatric Diagnoses   Suicide attempt by multiple drug overdose (HCC) 2.    Alcohol  use disorder, severe, dependence (HCC)   Assessment   [name] is a 58 y.o. AA female with a past psychiatric history of schizoaffective disorder, bipolar, schizophrenia, substance-induced mood disorder/psychosis, alcohol  use disorder severe, polysubstance abuse (I.e., though largely crack cocaine), and MDD, with pertinent medical comorbidities/history that includes COPD, atrial fibrillation, seizures, diabetes, and DDD, who presents this encounter by way of [employer] under involuntary commitment taken out by family for concerns for possible suicide attempt by way of overdose, who upon arrival and EDP evaluation, consulted psychiatry for specialty evaluation and recommendations. Patient is currently involuntarily committed at this time, as well as medically clear, per EDP team.  Patient presents with symptomology that is most consistent with a suicide attempt by multiple drug overdose on prescription medications and alcohol  use disorder, severe, dependence.  Currently upon assessment patient is observed standing at her door talking with staff.  She is  alert/oriented x 3.  She is hyperverbal and appears anxious.  She is hyperactive and moves around the room often.  She expresses her frustration that her daughter placed an involuntary commitment.  Patient states that she was in her home and had a seizure and that is why she was laying in the floor when her daughter came to her home.  States that her daughter is lying about her making a suicide attempt.  She has a labile affect.  She has to be redirected throughout the assessment as she is easily distracted.  She appears to be somewhat delusional.  She believes that she has been in the hospital for the past 5 day for back pain.  She also appears paranoid.  She is extremely focused on her daughter.  States her daughter has taken of her life, her money, her close, food and transportation.  She believes that the food that her daughter has been bringing her over the past week has made her sick because she has a dog in the home.  She is currently denying SI/HI/AVH.  She currently does not appear to be responding to internal/external stimuli.  Patient continues to not be forthcoming with alcohol  use.  Reports she drinks 140 ounce beer in the a.m. and p.m.  She does state that if someone gives her beer throughout the day she will drink that as well.  She is currently denying any alcohol  withdrawal symptoms.  States she takes no psychiatric medications and has no psychiatric services in place.  Addendum 3:30 PM  notified by nursing pt requesting to speak to provider again. Patient very agitated. States everyone is lying about her. She is illogical. Her speech is loud and pressured. She is yelling and states she would rather go to jail than to go to [facility]. She also stated when I get out of this place I am going to pour gasoline around this place and burn it down.   Call contact [name] [phone] at patient's request.  Daughter states she was not better when the incident happened.  However she is aware that patient  is very frustrated with her sister who is patient's POA.  States that she does have a lot of control over patient's money and it is really starting to make patient agitated.  Patient is unable to buy her own beer.  Patient drinks all day she has to hustle for money or ask other people to buy her alcohol .  States when patient becomes agitated she is unable to reason I will say things that she does not mean.  She cannot say if patient was suicidal or not before admission.  Diagnoses:  Active Hospital problems: Principal Problem:   Suicide attempt by multiple drug overdose [facility]) Active Problems:   Schizoaffective disorder (HCC)   Alcohol  use disorder, severe, dependence (HCC)    Plan   #  Suicide attempt by multiple drug overdose (HCC) #  Alcohol  use disorder, severe, dependence (HCC)  ## Psychiatric Medication Recommendations:   - Comntinue  CIWA protocols/Librium  taper for anticipated EtOH withdrawal - Start Abilify  5 mg  QTC 442- 02/13/2024 -Addendum 3:30 PM-Geodon  20 mg IM once as needed for severe agitation -  ## Medical Decision Making Capacity: Has capacity  ## Further Work-up: UDS/UA  ## Disposition:--Recommend inpatient mental health hospitalization under involuntary commitment  ## Behavioral / Environmental: -Strict agitation/safety precautions    ## Safety and Observation Level:  - Based on my clinical evaluation, I estimate the patient to be at high risk of self harm in the current setting. - At this time, we recommend  1:1 Observation. This decision is based on my review of the chart including patient's history and current presentation, interview of the patient, mental status examination, and consideration of suicide risk including evaluating suicidal ideation, plan, intent, suicidal or self-harm behaviors, risk factors, and protective factors. This judgment is based on our ability to directly address suicide risk, implement suicide prevention strategies, and develop a  safety plan while the patient is in the clinical setting. Please contact our team if there is a concern that risk level has changed.  CSSR Risk Category:   Suicide Risk Assessment: Patient has following modifiable risk factors for suicide: recklessness, medication noncompliance, lack of access to outpatient mental health resources, active mental illness (to encompass adhd, tbi, mania, psychosis, trauma reaction), current symptoms: anxiety/panic, insomnia, impulsivity, anhedonia, hopelessness, triggering events, and recent loss (death, isolation, vocation), which we are addressing by recommendations. Patient has following non-modifiable or demographic risk factors for suicide: separation or divorce, early widowhood, history of suicide attempt, history of self harm behavior, and psychiatric hospitalization Patient has the following protective factors against suicide: Access to outpatient mental health care, Supportive family, and Supportive friends  Thank you for this consult request. Recommendations have been communicated to the primary team.  We will continue to follow at this time.   [name] VEAR Batter, NP      History of Present Illness   [name] is a 58 y.o. AA female with a past psychiatric history  of schizoaffective disorder, bipolar, schizophrenia, substance-induced mood disorder/psychosis, alcohol  use disorder severe, polysubstance abuse (I.e., though largely crack cocaine), and MDD, with pertinent medical comorbidities/history that includes COPD, atrial fibrillation, seizures, diabetes, and DDD, who presents this encounter by way of [employer] under involuntary commitment taken out by family for concerns for possible suicide attempt by way of overdose, who upon arrival and EDP evaluation, consulted psychiatry for specialty evaluation and recommendations. Patient is currently involuntarily committed at this time, as well as medically clear, per EDP team.  [name] Gravely NP initial psychiatry  consult, :Patient seen today at the [facility] emergency department for face-to-face psychiatric evaluation.  Patient adamantly refutes she attempted suicide just prior to being brought in this encounter, but collateral obtained from the patient's daughter gives testimony that the patient was found in her residence with the home barricaded so that no one could get in, and that the patient was found unresponsive on her couch agonal breathing, with multiple various prescription pills surrounding the patient's body on the floor, appearing to have been spilled, with the patient's 7-day/night pill holder completely open on the coffee table next to the patient on the table completely empty, to which in collaboration with collateral obtained from police listed below, gives overwhelming clinical suspicion that the patient did attempt suicide.  Given the substantial collateral reports obtained, for safety and security of the patient, recommendation is for inpatient mental health hospitalization at this time, under involuntary commitment.  Furthermore, from collateral obtained from the patient's daughter, as well as this provider's evaluation of the patient, and extensive knowledge of the patient over serial evaluations over the years, will initiate Librium  taper/CIWA protocols for anticipation of EtOH withdrawal, given the patient's severe and heavy EtOH use on a daily basis.  Given patient's overdose attempt, will refrain from further psychopharmacological intervention at this time, as well as because the patient is also not amenable to other psychopharmacological interventions, outside of CIWA protocol/Librium  taper.  Patient is well-known to the behavioral health service line, including this provider, frequently presents due to EtOH intoxication that subsequently leads to endorsements of suicidal and/or homicidal ideations, property destruction, harming herself and or others, and or the performing of reckless  acts that endanger herself and or others. Patient is well supported by her daughter [name]   Upon approach, patient is immediately observed severely agitated, screaming at any staff that come near her, and physically posturing towards staff around her while in restraints, while perseverating out loud to everyone around her that, they're fucking lying! I didn't try to kill myself!.   Patient directed to speak to this provider to complete evaluation, but patient states, I ain't doing fucking shit! I need to be let go now!. Patient attempted to be redirected several times, and given encouragement to participate with this provider towards completing an evaluation, but outside of the repeated endorsements that she did not attempt to kill herself, that she is not suicidal and there is no reason to hold me, and endorsement that she continues to drink EtOH, every day, that's my mother fuckin' business, I can do what I want!, patient refuses to participate in any further meaningful way.  Patient orientation is grossly intact, without concerns for fluctuation of consciousness.  Patient does not appear to be in any physical pain.  Patient does not appear intoxicated on illicit substances and/or EtOH.  Collateral, patient's daughter, [name], spoken to over the phone at [phone]  Call placed and extensive conversation held with  the patient's daughter, of whom is also notably the IVC petitioner, and the patient's POA.  Patient's daughter endorses that she began to be worried about her mother when she could not get in touch with her last night to drop off food like she normally does, nor throughout the remainder of the night multiple times when she called her mother, or into the next day.  Patient's daughter reports that because the patient continued to not answer the phone for her and her sister into the next day, i.e. today, patient's daughter reports that her and her sister  decided earlier this morning, that if their mother does not answer the phone, or is not heard from by 11:00am, they will proceed with having a welfare check conducted on the patient, or go over and check on the patient if they get a bad feeling.  Patient's daughter reports that she proceeded to get a bad feeling sometime before 69, around 1030, so she states that she proceeded to go over with her sister to check on the patient, when they proceeded to find that the patient had barricaded the home, locked all the doors, and oddly put a large stick blocking the sliding glass door from sliding to let anyone in, so she states that she proceeded to look for a window that was unlocked, when eventually she states her and her sister had success and climbed into the home to check on the patient, when appreciably the patient's daughter states that they found their mother lying on the couch agonal breathing with pills on the floor surrounding her, and the patient's 7-day/night pill holder completely empty on the table next to her, so they proceeded to call EMS/[employer] for help, and filed IVC paperwork for concerns for suicide attempt by way of overdose, so the patient could be brought in for safety and additional care.  Patient's daughter additionally endorses that the patient has been increasingly giving endorsements of suicidal ideations over the last several months after the patient's husband died earlier this year, and gives insight that actually this is not the first time that the patient has attempted to overdose recently, stating that the patient has actually attempted recently to overdose this year but was not hospitalized, and attempted to overdose late last year but also did not require hospitalization.  Discussed with patient's daughter that given evaluation conducted by this provider, as well as collateral obtained from police and herself, recommendation would be for inpatient mental health hospitalization, for  safety and stabilization of the patient.  Patient's daughter affirms that the patient continues to utilize EtOH daily in the form of 2 or 3 large malt liquor bottles per day, but has since stopped utilizing crack cocaine.  Discussed with patient's daughter that CIWA protocols/Librium  taper would be put in place, but that at this time further psychopharmacological intervention would be held, given patient's refusal, as well as very likely overdose attempt that led to this encounter.  Collateral, [employer] at bedside, spoken to in person  GPD endorses that they found the patient in the home and were immediately engaged with hostility by the patient. [employer]PD affirms that they did find evidence to suggest that the patient did overdose, found prescription pills lying all over the floor, found open pill bottles in the trash and on the floor, as well as affirm that the patient had her 7-day/night pill counter completely open on the counter next to her at one point.   Review of Systems  Respiratory:  Negative for cough  and shortness of breath.   Gastrointestinal:  Negative for abdominal pain, diarrhea and vomiting.  Musculoskeletal: Negative.   Neurological:  Negative for tremors.  Psychiatric/Behavioral:  Positive for suicidal ideas. Substance abuse: I drink every day, that's my mother fuckin' business, I can do what I want!a.The patient is nervous/anxious.   All other systems reviewed and are negative.    Psychiatric and Social History  Psychiatric History:  Information collected from chart review/patient/patient's daughter  Prev Dx/Sx: schizoaffective disorder, bipolar, schizophrenia, substance-induced mood disorder/psychosis, alcohol  use disorder severe, polysubstance abuse (I.e., though largely crack cocaine), and MDD Current Psych Provider: None Home Meds (current): None Previous Med Trials: Multiple, most recent-Depakote , Abilify , Remeron  Therapy: None  Prior Psych Hospitalization: Multiple Prior  Self Harm: Yes, multiple suicide attempts, most recent 2025 tried to overdose; most serious attempt, 2023 overdose which led to intensive care unit stay Prior Violence: Yes, frequently  Family Psych History: None reported Family Hx suicide: None reported  Social History:  Developmental Hx: None reported Educational Hx: None reported Occupational Hx: SSDI Legal Hx: Multiple involuntary commitments; has a POA (daughter) Living Situation: Lives alone, but mother down the street, and daughters visit often Spiritual Hx: None reported Access to weapons/lethal means: None reported  Substance History Alcohol : Daily  Type of alcohol : Malt liquor Last Drink: Unknown Number of drinks per day: two or three forty ounces per day History of alcohol  withdrawal seizures: None reported History of DT's : None reported Tobacco: Daily Illicit drugs: Cocaine, not recently, per family Prescription drug abuse: None reported Rehab hx: None reported  Exam Findings  Physical Exam: As below Vital Signs:  Temp:  [97.6 F (36.4 C)-97.9 F (36.6 C)] 97.9 F (36.6 C) (08/23 0633) Pulse Rate:  [98-110] 98 (08/23 0633) Resp:  [18-25] 18 (08/23 0633) BP: (122-125)/(77-106) 125/80 (08/23 0633) SpO2:  [89 %-95 %] 94 % (08/23 0633) Blood pressure 125/80, pulse 98, temperature 97.9 F (36.6 C), temperature source Oral, resp. rate 18, last menstrual period 01/08/2011, SpO2 94%. There is no height or weight on file to calculate BMI.  Physical Exam Vitals and nursing note reviewed.  Constitutional:      General: She is not in acute distress.    Appearance: She is not ill-appearing, toxic-appearing or diaphoretic.     Comments:    Pulmonary:     Effort: Pulmonary effort is normal.  Musculoskeletal:        General: Normal range of motion.  Neurological:     Mental Status: She is alert and oriented to person, place, and time.     Motor: No tremor or seizure activity.     Comments:     Psychiatric:         Attention and Perception: Attention and perception normal.        Mood and Affect: Mood is anxious. Affect is labile.        Speech: Speech is rapid and pressured.        Behavior: Behavior is uncooperative, agitated and hyperactive.        Thought Content: Thought content is paranoid and delusional. Thought content does not include homicidal or suicidal ideation.        Cognition and Memory: Cognition normal.        Judgment: Judgment is impulsive.     Comments: Mood: Angry Cognition and Memory: Grossly intact      Mental Status Exam: General Appearance: Severely agitated mildly obese African-American female with casual clothing and fair grooming  Orientation:  Other:  Grossly intact, refuses to answer formal orientation questions  Memory: Grossly intact  Concentration: Grossly intact   Recall:  Grossly intact  Attention  Other: Grossly intact  Eye Contact: Variable to intense  Speech:  Pressured and rapid  Language:  Fair  Volume:  Increased, yelling  Mood: Fuck all of ya'll right now!   Affect:  Congruent  Thought Process:  Coherent, Goal Directed, and Linear  Thought Content: Logical with paranoid expressions around family setting me up  Suicidal Thoughts:  No  Homicidal Thoughts:  No  Judgement:  Other:  Acutely impaired  Insight:  Lacking  Psychomotor Activity:  Increased and Restlessness  Akathisia:  No  Fund of Knowledge:  Grossly intact      Assets:  Architect Housing Leisure Time Physical Health Resilience Social Support Talents/Skills Transportation Vocational/Educational  Cognition:  WNL  ADL's:  Intact  AIMS (if indicated):   0     Other History   These have been pulled in through the EMR, reviewed, and updated if appropriate.  Family History:  The patient's family history includes COPD in an other family member; Diabetes in her mother; Heart attack in her father and sister; Heart disease in her mother;  Hypertension in her mother.  Medical History: Past Medical History:  Diagnosis Date   Abdominal pain    Accidental drug overdose April 2013   Anxiety    Atrial fibrillation (HCC) 09/29/11   converted spontaneously   Chronic back pain    Chronic knee pain    Chronic nausea    Chronic pain    COPD (chronic obstructive pulmonary disease) (HCC)    Depression    Diabetes mellitus    states her doctor took her off all DM meds in past month   Diabetic neuropathy (HCC)    Dyspnea    with exertion    GERD (gastroesophageal reflux disease)    Headache(784.0)    migraines    HTN (hypertension)    not on meds since in a year    Hyperlipidemia    Hypothyroidism    not on meds in a while    Mental disorder    Bipolar and schizophrenic   Nausea and vomiting 01/02/2023   Requires supplemental oxygen     as needed per patient    Schizophrenia (HCC)    Schizophrenia, acute (HCC) 11/13/2017   Tobacco abuse     Surgical History: Past Surgical History:  Procedure Laterality Date   ABDOMINAL HYSTERECTOMY     BLADDER SUSPENSION  03/04/2011   Procedure: [facility] PROCEDURE;  Surgeon: Glendia LABOR MacDiarmid;  Location: [facility];  Service: Urology;  Laterality: N/A;   BOWEL RESECTION N/A 04/18/2022   Procedure: SMALL BOWEL RESECTION;  Surgeon: Vanderbilt Ned, MD;  Location: [facility];  Service: General;  Laterality: N/A;   CYSTOCELE REPAIR  03/04/2011   Procedure: ANTERIOR REPAIR (CYSTOCELE);  Surgeon: Glendia LABOR Elizabeth;  Location: [facility];  Service: Urology;  Laterality: N/A;   CYSTOSCOPY  03/04/2011   Procedure: CYSTOSCOPY;  Surgeon: Glendia LABOR MacDiarmid;  Location: [facility];  Service: Urology;  Laterality: N/A;   ESOPHAGOGASTRODUODENOSCOPY (EGD) WITH PROPOFOL  N/A 05/12/2017   Procedure: ESOPHAGOGASTRODUODENOSCOPY (EGD) WITH PROPOFOL ;  Surgeon: Ethyl Lenis, MD;  Location: [facility];  Service: General;  Laterality: N/A;   GASTRIC ROUX-EN-Y N/A 03/25/2016   Procedure: LAPAROSCOPIC ROUX-EN-Y GASTRIC BYPASS  WITH UPPER ENDOSCOPY;  Surgeon: Morene Olives, MD;  Location: [facility];  Service: General;  Laterality: N/A;  KNEE SURGERY     LAPAROSCOPIC ASSISTED VAGINAL HYSTERECTOMY  03/04/2011   Procedure: LAPAROSCOPIC ASSISTED VAGINAL HYSTERECTOMY;  Surgeon: Rosaline LITTIE Cobble, MD;  Location: [facility];  Service: Gynecology;  Laterality: N/A;   LAPAROTOMY N/A 04/18/2022   Procedure: EXPLORATORY LAPAROTOMY;  Surgeon: Vanderbilt Ned, MD;  Location: [facility];  Service: General;  Laterality: N/A;   LAPAROTOMY N/A 04/24/2022   Procedure: BRING BACK EXPLORATORY LAPAROTOMY;  Surgeon: Sebastian Moles, MD;  Location: [facility] OR;  Service: General;  Laterality: N/A;   TOTAL HIP ARTHROPLASTY Right 08/27/2022   Procedure: TOTAL HIP ARTHROPLASTY;  Surgeon: Edna Toribio LABOR, MD;  Location: [facility];  Service: Orthopedics;  Laterality: Right;     Medications:   Current Facility-Administered Medications:    albuterol  (VENTOLIN  HFA) 108 (90 Base) MCG/ACT inhaler 2 puff, 2 puff, Inhalation, Q4H PRN, Trifan, Donnice PARAS, MD   chlordiazePOXIDE  (LIBRIUM ) capsule 25 mg, 25 mg, Oral, Q6H PRN, Mannie [name] PARAS, NP   chlordiazePOXIDE  (LIBRIUM ) capsule 25 mg, 25 mg, Oral, QID, 25 mg at 02/13/24 1110 **FOLLOWED BY** chlordiazePOXIDE  (LIBRIUM ) capsule 25 mg, 25 mg, Oral, TID **FOLLOWED BY** [START ON 02/14/2024] chlordiazePOXIDE  (LIBRIUM ) capsule 25 mg, 25 mg, Oral, BH-qamhs **FOLLOWED BY** [START ON 02/15/2024] chlordiazePOXIDE  (LIBRIUM ) capsule 25 mg, 25 mg, Oral, Daily, Mannie [name] PARAS, NP   hydrOXYzine  (ATARAX ) tablet 25 mg, 25 mg, Oral, Q6H PRN, Mannie [name] PARAS, NP   levETIRAcetam  (KEPPRA  XR) 24 hr tablet 500 mg, 500 mg, Oral, BID, Trifan, Donnice PARAS, MD   loperamide  (IMODIUM ) capsule 2-4 mg, 2-4 mg, Oral, PRN, Mannie [name] PARAS, NP   metFORMIN  (GLUCOPHAGE -XR) 24 hr tablet 750 mg, 750 mg, Oral, Q breakfast, Trifan, Donnice PARAS, MD, 750 mg at 02/13/24 1208   metoprolol  tartrate (LOPRESSOR ) tablet 25 mg, 25 mg, Oral, BID, Trifan, Donnice PARAS, MD,  25 mg at 02/13/24 1209   nicotine  (NICODERM CQ  - dosed in mg/24 hr) patch 7 mg, 7 mg, Transdermal, Once, Zackowski, Scott, MD, 7 mg at 02/12/24 1936   ondansetron  (ZOFRAN -ODT) disintegrating tablet 4 mg, 4 mg, Oral, Q6H PRN, Mannie [name] PARAS, NP  Current Outpatient Medications:    albuterol  (VENTOLIN  HFA) 108 (90 Base) MCG/ACT inhaler, Inhale 2 puffs into the lungs every 4 (four) hours as needed for wheezing or shortness of breath., Disp: 18 g, Rfl: 1   OXYGEN , Inhale 4-5 L/min into the lungs daily as needed (for COPD-related symptoms)., Disp: , Rfl:    predniSONE  (DELTASONE ) 20 MG tablet, Take 2 tablets (40 mg total) by mouth daily for 4 days, Disp: 8 tablet, Rfl: 0   ARIPiprazole  (ABILIFY ) 10 MG tablet, Take 1 tablet (10 mg total) by mouth daily. (Patient not taking: Reported on 02/12/2024), Disp: 90 tablet, Rfl: 1   cyclobenzaprine  (FLEXERIL ) 5 MG tablet, Take 1 tablet (5 mg total) by mouth 3 (three) times daily as needed for muscle spasms. (Patient not taking: Reported on 02/12/2024), Disp: 30 tablet, Rfl: 0   divalproex  (DEPAKOTE ) 500 MG DR tablet, Take 500 mg by mouth 2 (two) times daily. (Patient not taking: Reported on 02/12/2024), Disp: , Rfl:    fluticasone  furoate-vilanterol (BREO ELLIPTA ) 100-25 MCG/ACT AEPB, Inhale 1 puff into the lungs daily. (Patient not taking: No sig reported), Disp: 60 each, Rfl: 3   folic acid  (FOLVITE ) 1 MG tablet, Take 1 tablet (1 mg total) by mouth daily. (Patient not taking: Reported on 02/12/2024), Disp: 90 tablet, Rfl: 1   ipratropium-albuterol  (DUONEB) 0.5-2.5 (3) MG/3ML SOLN, Take 3 mLs by nebulization every 6 (six)  hours as needed (for shortness of breath or wheezing). (Patient not taking: Reported on 02/12/2024), Disp: 360 mL, Rfl: 1   levETIRAcetam  (KEPPRA  XR) 500 MG 24 hr tablet, Take 1 tablet (500 mg total) by mouth 2 (two) times daily. (Patient not taking: Reported on 02/12/2024), Disp: 180 tablet, Rfl: 1   metFORMIN  (GLUCOPHAGE -XR) 750 MG 24 hr tablet, Take  1 tablet (750 mg total) by mouth daily with breakfast. (Patient not taking: Reported on 02/12/2024), Disp: 90 tablet, Rfl: 1   metoprolol  tartrate (LOPRESSOR ) 25 MG tablet, Take 1 tablet (25 mg total) by mouth 2 (two) times daily. (Patient not taking: Reported on 02/12/2024), Disp: 180 tablet, Rfl: 1   nicotine  (NICODERM CQ  - DOSED IN MG/24 HOURS) 21 mg/24hr patch, Place 1 patch (21 mg total) onto the skin daily. (Patient not taking: Reported on 02/12/2024), Disp: 28 patch, Rfl: 1   pantoprazole  (PROTONIX ) 40 MG tablet, Take 1 tablet (40 mg total) by mouth daily. (Patient not taking: Reported on 02/12/2024), Disp: 90 tablet, Rfl: 1   thiamine  (VITAMIN B-1) 100 MG tablet, Take 1 tablet (100 mg total) by mouth daily. (Patient not taking: No sig reported), Disp: 30 tablet, Rfl: 1   umeclidinium bromide  (INCRUSE ELLIPTA ) 62.5 MCG/ACT AEPB, Inhale 1 puff into the lungs daily. (Patient not taking: Reported on 02/12/2024), Disp: 30 each, Rfl: 1   zonisamide  (ZONEGRAN ) 100 MG capsule, Take 2 capsules (200 mg total) by mouth daily. (Patient not taking: Reported on 02/12/2024), Disp: 90 capsule, Rfl: 1  Allergies: Allergies  Allergen Reactions   Iron  Dextran Shortness Of Breath and Anxiety   Aspirin  Nausea And Vomiting and Other (See Comments)    Ok to take tylenol  or ibuprofen     Penicillins Other (See Comments)    Unknown reaction from childhood: family would like this to remain as an allergy.  Tolerated cephalosporins and zosyn     [name] VEAR Batter, NP

## 2024-02-13 NOTE — ED Notes (Signed)
 Pt with [employer] at this time for escort to [facility]. Pt calm and cooperative at time of departure. IVC papers given to PD.

## 2024-02-13 NOTE — ED Notes (Signed)
 Pt not wanting to take medication saying I am not taking anything because when I do take my medicine they say I am overdosing.

## 2024-02-13 NOTE — ED Provider Notes (Signed)
 Emergency Medicine Observation Re-evaluation Note  [name] is a 58 y.o. female, seen on rounds today.  Pt initially presented to the ED for complaints of Psychiatric Evaluation and Drug Overdose Currently, the patient is resting  Physical Exam  BP 125/80 (BP Location: Left Arm)   Pulse 98   Temp 97.9 F (36.6 C) (Oral)   Resp 18   LMP 01/08/2011   SpO2 94%  Physical Exam General: NAD Cardiac: Regular HR Lungs: No resp distress  ED Course / MDM  EKG:EKG Interpretation Date/Time:  Friday February 12 2024 14:15:30 EDT Ventricular Rate:  103 PR Interval:  142 QRS Duration:  93 QT Interval:  337 QTC Calculation: 442 R Axis:   61  Text Interpretation: Sinus tachycardia Biatrial enlargement RSR' in V1 or V2, probably normal variant No significant change since last tracing Confirmed by Emil Share [phone]) on 02/12/2024 2:29:51 PM  I have reviewed the labs performed to date as well as medications administered while in observation.    Plan  Current plan is for inpatient psychiatric placement  Patient here under IVC with suspected intentional drug overdose at home    Cottie Donnice PARAS, MD 02/13/24 1109

## 2024-02-13 NOTE — ED Notes (Signed)
 All pt belongings, including security items placed in belongings bag and given to PD on transfer to [facility].

## 2024-02-13 NOTE — ED Notes (Signed)
 Pt interacting with staff, pleasant and calm.

## 2024-02-13 NOTE — ED Notes (Signed)
 Pt states she shouldn't be charged twice for being committed or transferring to different facilities. Pt want documentation in her chart.

## 2024-02-13 NOTE — ED Notes (Signed)
 Pt mumbling in room stating she is going to set it off and light a match. Pt staring at TV

## 2024-02-14 ENCOUNTER — Encounter (HOSPITAL_COMMUNITY): Payer: Self-pay | Admitting: Psychiatry

## 2024-02-14 ENCOUNTER — Other Ambulatory Visit: Payer: Self-pay

## 2024-02-14 ENCOUNTER — Inpatient Hospital Stay
Admission: AD | Admit: 2024-02-14 | Discharge: 2024-02-19 | DRG: 883 | Disposition: A | Discharge status: Home / Self Care | Source: Intra-hospital | Attending: Psychiatry | Admitting: Psychiatry

## 2024-02-14 DIAGNOSIS — F259 Schizoaffective disorder, unspecified: Secondary | ICD-10-CM | POA: Diagnosis present

## 2024-02-14 DIAGNOSIS — Z8249 Family history of ischemic heart disease and other diseases of the circulatory system: Secondary | ICD-10-CM

## 2024-02-14 DIAGNOSIS — Z634 Disappearance and death of family member: Secondary | ICD-10-CM | POA: Diagnosis not present

## 2024-02-14 DIAGNOSIS — F17213 Nicotine dependence, cigarettes, with withdrawal: Secondary | ICD-10-CM | POA: Diagnosis present

## 2024-02-14 DIAGNOSIS — Z96641 Presence of right artificial hip joint: Secondary | ICD-10-CM | POA: Diagnosis present

## 2024-02-14 DIAGNOSIS — F329 Major depressive disorder, single episode, unspecified: Secondary | ICD-10-CM | POA: Diagnosis present

## 2024-02-14 DIAGNOSIS — Z833 Family history of diabetes mellitus: Secondary | ICD-10-CM

## 2024-02-14 DIAGNOSIS — Z825 Family history of asthma and other chronic lower respiratory diseases: Secondary | ICD-10-CM

## 2024-02-14 DIAGNOSIS — Z880 Allergy status to penicillin: Secondary | ICD-10-CM

## 2024-02-14 DIAGNOSIS — R110 Nausea: Secondary | ICD-10-CM | POA: Diagnosis present

## 2024-02-14 DIAGNOSIS — E039 Hypothyroidism, unspecified: Secondary | ICD-10-CM | POA: Diagnosis present

## 2024-02-14 DIAGNOSIS — F603 Borderline personality disorder: Secondary | ICD-10-CM | POA: Diagnosis present

## 2024-02-14 DIAGNOSIS — Z90710 Acquired absence of both cervix and uterus: Secondary | ICD-10-CM

## 2024-02-14 DIAGNOSIS — Z9884 Bariatric surgery status: Secondary | ICD-10-CM | POA: Diagnosis not present

## 2024-02-14 DIAGNOSIS — G8929 Other chronic pain: Secondary | ICD-10-CM | POA: Diagnosis present

## 2024-02-14 DIAGNOSIS — J449 Chronic obstructive pulmonary disease, unspecified: Secondary | ICD-10-CM | POA: Diagnosis present

## 2024-02-14 DIAGNOSIS — Z723 Lack of physical exercise: Secondary | ICD-10-CM

## 2024-02-14 DIAGNOSIS — Z888 Allergy status to other drugs, medicaments and biological substances status: Secondary | ICD-10-CM

## 2024-02-14 DIAGNOSIS — Z9151 Personal history of suicidal behavior: Secondary | ICD-10-CM

## 2024-02-14 DIAGNOSIS — I4891 Unspecified atrial fibrillation: Secondary | ICD-10-CM | POA: Diagnosis present

## 2024-02-14 DIAGNOSIS — R4586 Emotional lability: Secondary | ICD-10-CM | POA: Diagnosis present

## 2024-02-14 DIAGNOSIS — E785 Hyperlipidemia, unspecified: Secondary | ICD-10-CM | POA: Diagnosis present

## 2024-02-14 DIAGNOSIS — Z7984 Long term (current) use of oral hypoglycemic drugs: Secondary | ICD-10-CM

## 2024-02-14 DIAGNOSIS — F419 Anxiety disorder, unspecified: Secondary | ICD-10-CM | POA: Diagnosis present

## 2024-02-14 DIAGNOSIS — I10 Essential (primary) hypertension: Secondary | ICD-10-CM | POA: Diagnosis present

## 2024-02-14 DIAGNOSIS — F1990 Other psychoactive substance use, unspecified, uncomplicated: Secondary | ICD-10-CM

## 2024-02-14 DIAGNOSIS — F1020 Alcohol dependence, uncomplicated: Secondary | ICD-10-CM | POA: Diagnosis not present

## 2024-02-14 DIAGNOSIS — E1140 Type 2 diabetes mellitus with diabetic neuropathy, unspecified: Secondary | ICD-10-CM | POA: Diagnosis present

## 2024-02-14 DIAGNOSIS — K219 Gastro-esophageal reflux disease without esophagitis: Secondary | ICD-10-CM | POA: Diagnosis present

## 2024-02-14 DIAGNOSIS — F10239 Alcohol dependence with withdrawal, unspecified: Secondary | ICD-10-CM | POA: Diagnosis present

## 2024-02-14 DIAGNOSIS — R45851 Suicidal ideations: Secondary | ICD-10-CM | POA: Diagnosis present

## 2024-02-14 DIAGNOSIS — M549 Dorsalgia, unspecified: Secondary | ICD-10-CM | POA: Diagnosis present

## 2024-02-14 DIAGNOSIS — Z91410 Personal history of adult physical and sexual abuse: Secondary | ICD-10-CM | POA: Diagnosis not present

## 2024-02-14 DIAGNOSIS — Z79899 Other long term (current) drug therapy: Secondary | ICD-10-CM

## 2024-02-14 DIAGNOSIS — Z886 Allergy status to analgesic agent status: Secondary | ICD-10-CM

## 2024-02-14 LAB — SARS CORONAVIRUS 2 BY RT PCR: SARS Coronavirus 2 by RT PCR: NEGATIVE

## 2024-02-14 MED ORDER — DIPHENHYDRAMINE HCL 50 MG/ML IJ SOLN
50.0000 mg | Freq: Three times a day (TID) | INTRAMUSCULAR | Status: DC | PRN
  Administered 2024-02-15: 50 mg via INTRAMUSCULAR
  Filled 2024-02-14 (×2): qty 1

## 2024-02-14 MED ORDER — VITAMIN B-1 100 MG PO TABS: 100.0000 mg | ORAL_TABLET | Freq: Every day | ORAL | Status: DC

## 2024-02-14 MED ORDER — METFORMIN HCL ER 750 MG PO TB24: 750.0000 mg | ORAL_TABLET | Freq: Every day | ORAL | Status: DC

## 2024-02-14 MED ORDER — NICOTINE 21 MG/24HR TD PT24: 21.0000 mg | MEDICATED_PATCH | Freq: Every day | TRANSDERMAL | Status: DC

## 2024-02-14 MED ORDER — HYDROXYZINE HCL 25 MG PO TABS
25.0000 mg | ORAL_TABLET | Freq: Four times a day (QID) | ORAL | Status: AC | PRN
  Administered 2024-02-16 – 2024-02-17 (×2): 25 mg via ORAL
  Filled 2024-02-14 (×2): qty 1

## 2024-02-14 MED ORDER — THIAMINE HCL 100 MG/ML IJ SOLN: 100.0000 mg | Freq: Once | INTRAMUSCULAR | Status: DC

## 2024-02-14 MED ORDER — HYDROXYZINE HCL 25 MG PO TABS: 25.0000 mg | ORAL_TABLET | Freq: Four times a day (QID) | ORAL | Status: DC | PRN

## 2024-02-14 MED ORDER — LORAZEPAM 1 MG PO TABS
1.0000 mg | ORAL_TABLET | Freq: Three times a day (TID) | ORAL | Status: AC
  Administered 2024-02-15 – 2024-02-16 (×3): 1 mg via ORAL
  Filled 2024-02-14 (×2): qty 1

## 2024-02-14 MED ORDER — FLUTICASONE FUROATE-VILANTEROL 100-25 MCG/ACT IN AEPB
1.0000 | INHALATION_SPRAY | Freq: Every day | RESPIRATORY_TRACT | Status: DC
  Administered 2024-02-14: 1 via RESPIRATORY_TRACT
  Filled 2024-02-14 (×2): qty 28

## 2024-02-14 MED ORDER — DIPHENHYDRAMINE HCL 25 MG PO CAPS
50.0000 mg | ORAL_CAPSULE | Freq: Three times a day (TID) | ORAL | Status: DC | PRN
Start: 2024-02-14 — End: 2024-02-19
  Administered 2024-02-15 – 2024-02-18 (×2): 50 mg via ORAL
  Filled 2024-02-14 (×2): qty 2

## 2024-02-14 MED ORDER — LORAZEPAM 1 MG PO TABS
1.0000 mg | ORAL_TABLET | Freq: Every day | ORAL | Status: AC
  Administered 2024-02-18: 1 mg via ORAL
  Filled 2024-02-14: qty 1

## 2024-02-14 MED ORDER — ZONISAMIDE 100 MG PO CAPS: 200.0000 mg | ORAL_CAPSULE | Freq: Two times a day (BID) | ORAL | Status: DC

## 2024-02-14 MED ORDER — MAGNESIUM HYDROXIDE 400 MG/5ML PO SUSP: 30.0000 mL | Freq: Every day | ORAL | Status: DC | PRN

## 2024-02-14 MED ORDER — LORAZEPAM 1 MG PO TABS
1.0000 mg | ORAL_TABLET | Freq: Two times a day (BID) | ORAL | Status: AC
Start: 2024-02-16 — End: 2024-02-17
  Administered 2024-02-16 – 2024-02-17 (×2): 1 mg via ORAL
  Filled 2024-02-14 (×2): qty 1

## 2024-02-14 MED ORDER — KETOROLAC TROMETHAMINE 60 MG/2ML IM SOLN
30.0000 mg | Freq: Four times a day (QID) | INTRAMUSCULAR | Status: DC | PRN
  Administered 2024-02-14 – 2024-02-18 (×3): 30 mg via INTRAMUSCULAR
  Filled 2024-02-14 (×5): qty 2

## 2024-02-14 MED ORDER — LOPERAMIDE HCL 2 MG PO CAPS: 2.0000 mg | ORAL_CAPSULE | ORAL | Status: AC | PRN

## 2024-02-14 MED ORDER — NICOTINE POLACRILEX 2 MG MT GUM: 2.0000 mg | CHEWING_GUM | OROMUCOSAL | Status: DC | PRN

## 2024-02-14 MED ORDER — LORAZEPAM 1 MG PO TABS
1.0000 mg | ORAL_TABLET | Freq: Four times a day (QID) | ORAL | Status: AC | PRN
  Filled 2024-02-14: qty 1

## 2024-02-14 MED ORDER — ARIPIPRAZOLE 5 MG PO TABS: 5.0000 mg | ORAL_TABLET | Freq: Every day | ORAL | Status: DC

## 2024-02-14 MED ORDER — NICOTINE 14 MG/24HR TD PT24
14.0000 mg | MEDICATED_PATCH | Freq: Every day | TRANSDERMAL | Status: DC
  Filled 2024-02-14: qty 1

## 2024-02-14 MED ORDER — LOPERAMIDE HCL 2 MG PO CAPS: 2.0000 mg | ORAL_CAPSULE | ORAL | Status: DC | PRN

## 2024-02-14 MED ORDER — CHLORDIAZEPOXIDE HCL 25 MG PO CAPS: ORAL_CAPSULE | ORAL | Status: DC

## 2024-02-14 MED ORDER — CHLORDIAZEPOXIDE HCL 25 MG PO CAPS: 25.0000 mg | ORAL_CAPSULE | Freq: Four times a day (QID) | ORAL | Status: DC | PRN

## 2024-02-14 MED ORDER — ONDANSETRON 4 MG PO TBDP: 4.0000 mg | ORAL_TABLET | Freq: Four times a day (QID) | ORAL | Status: DC | PRN

## 2024-02-14 MED ORDER — NICOTINE 21 MG/24HR TD PT24
21.0000 mg | MEDICATED_PATCH | Freq: Every day | TRANSDERMAL | Status: DC
  Administered 2024-02-14: 21 mg via TRANSDERMAL
  Filled 2024-02-14: qty 1

## 2024-02-14 MED ORDER — LORAZEPAM 2 MG/ML IJ SOLN
2.0000 mg | Freq: Three times a day (TID) | INTRAMUSCULAR | Status: DC | PRN
  Administered 2024-02-15: 2 mg via INTRAMUSCULAR
  Filled 2024-02-14 (×2): qty 1

## 2024-02-14 MED ORDER — OLANZAPINE 10 MG IM SOLR: 5.0000 mg | Freq: Three times a day (TID) | INTRAMUSCULAR | Status: DC | PRN

## 2024-02-14 MED ORDER — HALOPERIDOL 5 MG PO TABS
5.0000 mg | ORAL_TABLET | Freq: Three times a day (TID) | ORAL | Status: DC | PRN
Start: 2024-02-14 — End: 2024-02-19
  Administered 2024-02-15: 5 mg via ORAL
  Filled 2024-02-14: qty 1

## 2024-02-14 MED ORDER — THIAMINE MONONITRATE 100 MG PO TABS
100.0000 mg | ORAL_TABLET | Freq: Every day | ORAL | Status: DC
  Administered 2024-02-16 – 2024-02-19 (×4): 100 mg via ORAL
  Filled 2024-02-14 (×5): qty 1

## 2024-02-14 MED ORDER — PNEUMOCOCCAL 20-VAL CONJ VACC 0.5 ML IM SUSY
0.5000 mL | PREFILLED_SYRINGE | INTRAMUSCULAR | Status: DC
  Filled 2024-02-14: qty 0.5

## 2024-02-14 MED ORDER — OLANZAPINE 5 MG PO TBDP: 5.0000 mg | ORAL_TABLET | Freq: Three times a day (TID) | ORAL | Status: DC | PRN

## 2024-02-14 MED ORDER — LORAZEPAM 1 MG PO TABS
1.0000 mg | ORAL_TABLET | Freq: Four times a day (QID) | ORAL | Status: AC
  Administered 2024-02-14: 1 mg via ORAL
  Filled 2024-02-14 (×2): qty 1

## 2024-02-14 MED ORDER — ZONISAMIDE 100 MG PO CAPS: 200.0000 mg | ORAL_CAPSULE | Freq: Every day | ORAL | Status: DC

## 2024-02-14 MED ORDER — HALOPERIDOL LACTATE 5 MG/ML IJ SOLN
5.0000 mg | Freq: Three times a day (TID) | INTRAMUSCULAR | Status: DC | PRN
  Administered 2024-02-15: 5 mg via INTRAMUSCULAR
  Filled 2024-02-14 (×2): qty 1

## 2024-02-14 MED ORDER — ONDANSETRON 4 MG PO TBDP
4.0000 mg | ORAL_TABLET | Freq: Four times a day (QID) | ORAL | Status: AC | PRN
  Administered 2024-02-14 – 2024-02-17 (×3): 4 mg via ORAL
  Filled 2024-02-14 (×3): qty 1

## 2024-02-14 NOTE — Progress Notes (Signed)
 D. Pt presented mildly irritable upon initial approach this morning- refused all of her medications, except her nicotine  patch. Pt stated, I will only take the shot- Pt referring to the Toradol  shot she received for pain. Pt educated on the importance of taking her medication to prevent seizures, but she continues to be adamant that she will not take it. MD made aware. Pt is visible in the milieu, observed making phone calls.  Pt currently denies SI/HI and AVH and doesn't appear to be responding to internal stimuli A. Labs and vitals monitored. Pt supported emotionally and encouraged to express concerns and ask questions.   R. Pt remains safe with 15 minute checks. Will continue POC.
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
## 2024-02-14 NOTE — Tx Team (Signed)
 Initial Treatment Plan 02/14/2024 12:46 AM [name] FMW:[id number]    PATIENT STRESSORS: Financial difficulties   Health problems   Marital or family conflict   Medication change or noncompliance   Substance abuse     PATIENT STRENGTHS: Active sense of humor  Average or above average intelligence  Communication skills  Supportive family/friends    PATIENT IDENTIFIED PROBLEMS: Depression  Suicidal Ideation (Pt adamantly denies)        Get back home to my house           DISCHARGE CRITERIA:  Improved stabilization in mood, thinking, and/or behavior Motivation to continue treatment in a less acute level of care Need for constant or close observation no longer present Verbal commitment to aftercare and medication compliance Withdrawal symptoms are absent or subacute and managed without 24-hour nursing intervention  PRELIMINARY DISCHARGE PLAN: Attend 12-step recovery group Outpatient therapy Return to previous living arrangement  PATIENT/FAMILY INVOLVEMENT: This treatment plan has been presented to and reviewed with the patient, [name],.  The patient and family have been given the opportunity to ask questions and make suggestions.  Effie Naomie Norris, RN 02/14/2024, 12:46 AM

## 2024-02-14 NOTE — Progress Notes (Signed)
   02/14/24 0900  Psych Admission Type (Psych Patients Only)  Admission Status Involuntary  Psychosocial Assessment  Patient Complaints Anxiety  Eye Contact Fair  Facial Expression Animated  Affect Appropriate to circumstance  Speech Logical/coherent  Interaction Assertive  Motor Activity Fidgety  Appearance/Hygiene Disheveled  Behavior Characteristics Anxious;Resistant to care  Mood Anxious  Thought Process  Coherency WDL  Content Blaming others  Delusions None reported or observed  Perception WDL  Hallucination None reported or observed  Judgment Impaired  Confusion None  Danger to Self  Current suicidal ideation? Denies  Danger to Others  Danger to Others None reported or observed

## 2024-02-14 NOTE — Progress Notes (Signed)
 Patient is a 58 year old female admitted involuntarily to the [facility] Psych floor from [facility] after reported suicide attempt by drug overdose. Patient was found home unresponsive with several prescription bottles open and or knocked over. Patient denies these claims. Patient presents to assessment via transport chair but is ambulatory. She is A+O x 4. She currently denies SI/HI/AVH. She does agree to contract for safety on the unit. Patient's affect is appropriate and speech is logical and coherent. Patient denies depression and anxiety. She currently endorses 4/10 abdominal pain. Patient denies the use of a mobility aid at home. Denies the use of reading glasses. Patient reports smoking  pack of cigs and drinking 2- 40 oz bottles of alcohol .   Emotional support and reassurance provided throughout admission intake. Consents signed. Afterwards, oriented patient to unit, room and call light, reviewed POC with all questions answered and concerns voiced. Patient verbalized understanding. Denies any needs at this time.  Will continue to monitor with ongoing Q 15 minute safety checks.
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
## 2024-02-14 NOTE — Tx Team (Signed)
 Initial Treatment Plan 02/14/2024 2:01 PM [name] FMW:[id number]    PATIENT STRESSORS: Health problems   Medication change or noncompliance     PATIENT STRENGTHS: Communication skills    PATIENT IDENTIFIED PROBLEMS:   My family is lying.                   DISCHARGE CRITERIA:  Ability to meet basic life and health needs Adequate post-discharge living arrangements Improved stabilization in mood, thinking, and/or behavior Safe-care adequate arrangements made  PRELIMINARY DISCHARGE PLAN: Attend aftercare/continuing care group Return to previous living arrangement  PATIENT/FAMILY INVOLVEMENT: This treatment plan has been presented to and reviewed with the patient, [name], and/or family member. The patient has been given the opportunity to ask questions and make suggestions.   Garen CINDERELLA Daring, RN 02/14/2024, 2:01 PM

## 2024-02-14 NOTE — Progress Notes (Signed)
 Report given to [place], RN [facility])

## 2024-02-14 NOTE — Progress Notes (Addendum)
(  Sleep Hours) - 2.5 hours (Any PRNs that were needed, meds refused, or side effects to meds)-  One time order for Toradol  30 mg IM given (Any disturbances and when (visitation, over night)- Pt O2 sats dropped to 89, see nurses notes  (Concerns raised by the patient)-  Pt is on 4-5 L of O2 as needed at home, states that she uses it at night when I can't breathe. (SI/HI/AVH)-  Denies

## 2024-02-14 NOTE — Progress Notes (Addendum)
 Pt o2 sats when she got up this morning were 85.  Pt asymptomatic, requested to take shower, denied pain at this time.  Pt denies need for Albuteral inhaler at this time.  Will retake when Pt comes out of bathroom, Pt aware.  AC notified, NP notified.  Plan to move Pt today to [facility] remains in place.    0650 Pt o2 sat came up to 93 taken right before breakfast.  Pt did go down to breakfast.

## 2024-02-14 NOTE — Progress Notes (Signed)
   02/14/24 1230  Psych Admission Type (Psych Patients Only)  Admission Status Involuntary  Psychosocial Assessment  Patient Complaints Anxiety  Eye Contact Fair  Facial Expression Animated  Affect Appropriate to circumstance  Speech Logical/coherent  Interaction Assertive  Motor Activity Fidgety  Appearance/Hygiene Disheveled  Behavior Characteristics Anxious;Irritable  Mood Anxious;Irritable  Thought Process  Coherency Circumstantial  Content Preoccupation;Blaming others  Delusions None reported or observed  Perception WDL  Hallucination None reported or observed  Judgment Impaired  Confusion None  Danger to Self  Current suicidal ideation? Denies

## 2024-02-14 NOTE — Group Note (Signed)
 Date:  02/14/2024 Time:  9:52 PM  Group Topic/Focus:  Wrap-Up Group:   The focus of this group is to help patients review their daily goal of treatment and discuss progress on daily workbooks.    Participation Level:  Did Not Attend  Participation Quality:     Affect:     Cognitive:     Insight: None  Engagement in Group:  None  Modes of Intervention:     Additional Comments:    [name] CHRISTELLA Bunker 02/14/2024, 9:52 PM

## 2024-02-14 NOTE — Progress Notes (Signed)
 Patient transported to [facility] via [employer].Belongings ( including her rescue inhaler) given to sheriff.  Prior to discharge, pt was refusing vitals, yelling, coughing and visibly SOB, but adamantly refused her inhaler several times, even as she was being walked out by [employer].

## 2024-02-14 NOTE — Plan of Care (Signed)
   Problem: Education: Goal: Knowledge of [place] [facility] Education information/materials will improve Outcome: Progressing

## 2024-02-14 NOTE — Progress Notes (Signed)
 Pt O2 sats dropped to 89 from 98 during sleep.  Pt is on O2 4-5 liters at home as needed and Pt states that is mainly at night when I can't breathe.   AC aware and states plan is to get Pt to [facility] today where O2 will be available to her at night.  NP orders PT to be rechecked in two hours.  Pt appears in no acute distress and is sleeping.  Pt was 89 yesterday at 1332  per [facility] ED documentation and doctor was aware of this as it was included in his notation.  Will continue to closely monitor.

## 2024-02-14 NOTE — Group Note (Unsigned)
 Date:  02/14/2024 Time:  8:32 PM  Group Topic/Focus:  Overcoming Stress:   The focus of this group is to define stress and help patients assess their triggers. Wrap-Up Group:   The focus of this group is to help patients review their daily goal of treatment and discuss progress on daily workbooks.     Participation Level:  {BHH PARTICIPATION OZCZO:77735}  Participation Quality:  {BHH PARTICIPATION QUALITY:22265}  Affect:  {BHH AFFECT:22266}  Cognitive:  {BHH COGNITIVE:22267}  Insight: {BHH Insight2:20797}  Engagement in Group:  {BHH ENGAGEMENT IN HMNLE:77731}  Modes of Intervention:  {BHH MODES OF INTERVENTION:22269}  Additional Comments:  ***  Arlester CHRISTELLA Servant 02/14/2024, 8:32 PM

## 2024-02-14 NOTE — Plan of Care (Signed)
  Problem: Education: Goal: Ability to state activities that reduce stress will improve Outcome: Not Progressing   Problem: Coping: Goal: Ability to identify and develop effective coping behavior will improve Outcome: Not Progressing   Problem: Self-Concept: Goal: Ability to identify factors that promote anxiety will improve Outcome: Not Progressing Goal: Level of anxiety will decrease Outcome: Not Progressing Goal: Ability to modify response to factors that promote anxiety will improve Outcome: Not Progressing   

## 2024-02-14 NOTE — Discharge Summary (Addendum)
  Identifying Information The patient is a 58 y.o. female with a medical history of seizure disorder and Asthma vs COPD and a complicated past psychaitric history that includes mood and psychotic symptoms in the context of longstanding substance use (cocaine, marijuana, alcohol ) as well as personality pathology (borderline personality disorder). She presented on this admission on IVC from daughter with reported concerns of SI with plan to OD.       Psychiatric history: Patient has a long and complicated psychiatric history with numerous prior admissions for mood symptoms (elevated and depressed), SI, suicidal gestures and mild psychotic symptoms (hallucinations) in the absence of clear disorganized thoughts/behavior. For these concerns she has been given numerous diagnoses in the past including bipolar disorder, SAD, substance-induced mood and psychotic disorders, etc. She does carry a clear borderline personality disorder based on lifelong instability in relationships, poor coping, impulsivity, affective lability, chronic SI, recurrent suicidal gestures and attempts (frequent OD on her medications). On inpatient psychiatric admissions she is known to be disruptive, and often non-compliant with care in the absence of manic and psychotic symptoms.  In addition to underlying personality pathology she often presents with worsening of mood or vague psychotic symptoms in the context of substance use, primarily alcohol  and cocaine. The patient was most recently admitted to psychiatry in November 2024 where she presented with SI and irritability in the setting of substance use. She became frustrated during admission, non-compliant with care and asked to leave. This was deemed her baseline and she was dischahrged from the hospital after a few days.    Pt's recent history is more notable for numerous ED visits related to poor self-care regarding medical concerns. Frequently in the ED for SOB due to ongoing smoking and  poor medication adherence. Notably, at most recent Neurology outpatient visit the patient scored low (21) on cognitive testing and she has been referred to neuropsychology for a formal evaluation, however consistent with at least mild cognitive impairment.   Upon arrival: On the unit the patient was noted to be sat at 85. She reportedly takes oxygen  at home at night and there have been concerns that the patient requires a higher level of care than can be offered at [facility]. She will be transferred to geriatric psychiatry.    Preliminary plan included the following, recommended at least medical medicines be restarted as below:  Psychiatric Concerns  -Continue Abilify  5 mg daily for mood lability  Substance use concerns  Alcohol  use disorder -Continue Librium  taper:             -25 mg TID  8/24             -25 mg BID 8/25             -25 mg once 8/26   Nicotine  Replacement  -21 mg patch    Medical concerns COPD vs asthma -pt frequntly in the ED for shortness of breath, often receives duo-nebs and/or prednisone . Reportedly worsened by cigarettes and general medication non-adherence per ED notes   -O2 Sat 85 this morning, asymptomatic, however on periodic oxygen  at home -Albuterol  PRN -Restart home fluticasone -furoate (Breo-Ellipta) 1 puff daily    Seizures: Ordered home regimen per most recent dispense: -Keppra  500 mg BID -Zonisimide 200 mg daily    Patient to be discharged from [facility] and transferred to geriatric psychiatry for a higher level of care    Leita Arts, MD  02/14/24

## 2024-02-15 ENCOUNTER — Other Ambulatory Visit (HOSPITAL_COMMUNITY): Payer: Self-pay

## 2024-02-15 DIAGNOSIS — F1020 Alcohol dependence, uncomplicated: Secondary | ICD-10-CM

## 2024-02-15 DIAGNOSIS — F603 Borderline personality disorder: Secondary | ICD-10-CM | POA: Diagnosis not present

## 2024-02-15 LAB — GLUCOSE, CAPILLARY: Glucose-Capillary: 111 mg/dL — ABNORMAL HIGH (ref 70–99)

## 2024-02-15 MED ORDER — DIVALPROEX SODIUM ER 500 MG PO TB24
500.0000 mg | ORAL_TABLET | Freq: Every day | ORAL | Status: DC
  Administered 2024-02-16 – 2024-02-19 (×4): 500 mg via ORAL
  Filled 2024-02-15 (×5): qty 1

## 2024-02-15 MED ORDER — IBUPROFEN 200 MG PO TABS
400.0000 mg | ORAL_TABLET | Freq: Four times a day (QID) | ORAL | Status: DC | PRN
  Administered 2024-02-15 – 2024-02-19 (×11): 400 mg via ORAL
  Filled 2024-02-15 (×12): qty 2

## 2024-02-15 MED ORDER — ARIPIPRAZOLE 5 MG PO TABS
5.0000 mg | ORAL_TABLET | Freq: Every day | ORAL | Status: DC
  Administered 2024-02-16 – 2024-02-19 (×4): 5 mg via ORAL
  Filled 2024-02-15 (×4): qty 1

## 2024-02-15 MED ORDER — NICOTINE 21 MG/24HR TD PT24
21.0000 mg | MEDICATED_PATCH | Freq: Every day | TRANSDERMAL | Status: DC
  Administered 2024-02-15 – 2024-02-19 (×5): 21 mg via TRANSDERMAL
  Filled 2024-02-15 (×6): qty 1

## 2024-02-15 MED ORDER — ALBUTEROL SULFATE HFA 108 (90 BASE) MCG/ACT IN AERS
2.0000 | INHALATION_SPRAY | Freq: Four times a day (QID) | RESPIRATORY_TRACT | Status: DC | PRN
  Administered 2024-02-15 – 2024-02-18 (×9): 2 via RESPIRATORY_TRACT
  Filled 2024-02-15: qty 6.7

## 2024-02-15 NOTE — Progress Notes (Signed)
   02/15/24 2100  Psych Admission Type (Psych Patients Only)  Admission Status Involuntary  Psychosocial Assessment  Patient Complaints Anxiety  Eye Contact Fair  Facial Expression Animated  Affect Appropriate to circumstance  Speech Soft;Logical/coherent  Interaction Minimal  Motor Activity Tremors;Slow  Appearance/Hygiene Unremarkable  Behavior Characteristics Appropriate to situation;Cooperative  Mood Anxious  Thought Process  Coherency Circumstantial  Content WDL  Delusions None reported or observed  Perception WDL  Hallucination None reported or observed  Judgment Impaired  Confusion Mild  Danger to Self  Current suicidal ideation? Denies  Agreement Not to Harm Self Yes  Description of Agreement verbal  Danger to Others  Danger to Others None reported or observed

## 2024-02-15 NOTE — Group Note (Signed)
 Date:  02/15/2024 Time:  5:26 PM  Group Topic/Focus:  Healthy Communication:   The focus of this group is to discuss communication, barriers to communication, as well as healthy ways to communicate with others.    Participation Level:  Active  Participation Quality:  Appropriate  Affect:  Appropriate  Cognitive:  Appropriate  Insight: Appropriate  Engagement in Group:  Engaged  Modes of Intervention:  Activity  Additional Comments:    Rocio Wolak 02/15/2024, 5:26 PM

## 2024-02-15 NOTE — Plan of Care (Signed)
  Problem: Coping: Goal: Ability to identify and develop effective coping behavior will improve Outcome: Not Progressing   

## 2024-02-15 NOTE — Plan of Care (Signed)
  Problem: Education: Goal: Ability to state activities that reduce stress will improve Outcome: Progressing   Problem: Coping: Goal: Ability to identify and develop effective coping behavior will improve Outcome: Progressing   Problem: Self-Concept: Goal: Level of anxiety will decrease Outcome: Progressing   

## 2024-02-15 NOTE — Progress Notes (Signed)
   02/15/24 1640  Psych Admission Type (Psych Patients Only)  Admission Status Involuntary  Psychosocial Assessment  Patient Complaints Irritability;Restlessness  Eye Contact Fair  Facial Expression Angry  Affect Labile  Speech Loud  Interaction Demanding  Motor Activity Restless;Posturing  Appearance/Hygiene In scrubs  Behavior Characteristics Irritable;Pacing;Posturing  Mood Labile;Irritable;Preoccupied  Thought Process  Coherency Circumstantial  Content Blaming others  Delusions None reported or observed  Perception WDL  Hallucination None reported or observed  Judgment Impaired  Confusion Mild  Danger to Self  Current suicidal ideation? Denies  Agreement Not to Harm Self Yes  Description of Agreement verbal  Danger to Others  Danger to Others None reported or observed   Patient irritable and demanding.  Refused Ativan  for withdraw symptoms because this writer wound not give her a snack.  Dr. Jadapalle aware.  PRN medications for agitation given twice (PO and IM).  IM medications given with show of force - no hold.

## 2024-02-15 NOTE — BH IP Treatment Plan (Signed)
 Interdisciplinary Treatment and Diagnostic Plan Update  02/15/2024 Time of Session: 10:15 AM  [name] MRN: [id number]  Principal Diagnosis: Borderline personality disorder (HCC)  Secondary Diagnoses: Principal Problem:   Borderline personality disorder (HCC)   Current Medications:  Current Facility-Administered Medications  Medication Dose Route Frequency Provider Last Rate Last Admin   albuterol  (VENTOLIN  HFA) 108 (90 Base) MCG/ACT inhaler 2 puff  2 puff Inhalation Q6H PRN Bobbitt, Shalon E, NP   2 puff at 02/15/24 1222   haloperidol  (HALDOL ) tablet 5 mg  5 mg Oral TID PRN Butler, Laura N, MD   5 mg at 02/15/24 1131   And   diphenhydrAMINE  (BENADRYL ) capsule 50 mg  50 mg Oral TID PRN Butler, Laura N, MD   50 mg at 02/15/24 1131   haloperidol  lactate (HALDOL ) injection 5 mg  5 mg Intramuscular TID PRN Towana Leita SAILOR, MD       And   diphenhydrAMINE  (BENADRYL ) injection 50 mg  50 mg Intramuscular TID PRN Towana Leita SAILOR, MD       And   LORazepam  (ATIVAN ) injection 2 mg  2 mg Intramuscular TID PRN Towana Leita SAILOR, MD       hydrOXYzine  (ATARAX ) tablet 25 mg  25 mg Oral Q6H PRN Shrivastava, Aryendra, MD       ibuprofen  (ADVIL ) tablet 400 mg  400 mg Oral Q6H PRN Bobbitt, Shalon E, NP   400 mg at 02/15/24 1247   ketorolac  (TORADOL ) injection 30 mg  30 mg Intramuscular Q6H PRN Shrivastava, Aryendra, MD   30 mg at 02/14/24 2011   loperamide  (IMODIUM ) capsule 2-4 mg  2-4 mg Oral PRN Shrivastava, Aryendra, MD       LORazepam  (ATIVAN ) tablet 1 mg  1 mg Oral Q6H PRN Shrivastava, Aryendra, MD   1 mg at 02/15/24 1131   LORazepam  (ATIVAN ) tablet 1 mg  1 mg Oral TID Shrivastava, Aryendra, MD   1 mg at 02/15/24 1548   Followed by   NOREEN ON 02/16/2024] LORazepam  (ATIVAN ) tablet 1 mg  1 mg Oral BID Shrivastava, Aryendra, MD       Followed by   NOREEN ON 02/18/2024] LORazepam  (ATIVAN ) tablet 1 mg  1 mg Oral Daily Shrivastava, Aryendra, MD       nicotine  (NICODERM CQ  - dosed in mg/24 hours) patch  21 mg  21 mg Transdermal Daily Jadapalle, Sree, MD   21 mg at 02/15/24 1138   ondansetron  (ZOFRAN -ODT) disintegrating tablet 4 mg  4 mg Oral Q6H PRN Shrivastava, Aryendra, MD   4 mg at 02/14/24 2121   thiamine  (VITAMIN B1) injection 100 mg  100 mg Intramuscular Once Shrivastava, Aryendra, MD       thiamine  (VITAMIN B1) tablet 100 mg  100 mg Oral Daily Shrivastava, Aryendra, MD       PTA Medications: Medications Prior to Admission  Medication Sig Dispense Refill Last Dose/Taking   ARIPiprazole  (ABILIFY ) 5 MG tablet Take 1 tablet (5 mg total) by mouth daily.      chlordiazePOXIDE  (LIBRIUM ) 25 MG capsule Take 1 capsule (25 mg total) by mouth every 6 (six) hours as needed for withdrawal (CIWA score > 10).      chlordiazePOXIDE  (LIBRIUM ) 25 MG capsule Take 1 capsule (25 mg total) by mouth 4 (four) times daily for 1 day, THEN 1 capsule (25 mg total) 3 (three) times daily for 1 day, THEN 1 capsule (25 mg total) 2 (two) times daily in the am and at bedtime. for 1 day, THEN  1 capsule (25 mg total) daily for 1 day.      hydrOXYzine  (ATARAX ) 25 MG tablet Take 1 tablet (25 mg total) by mouth every 6 (six) hours as needed for anxiety (or CIWA score </= 10).      loperamide  (IMODIUM ) 2 MG capsule Take 1-2 capsules (2-4 mg total) by mouth as needed for diarrhea or loose stools.      magnesium  hydroxide (MILK OF MAGNESIA) 400 MG/5ML suspension Take 30 mLs by mouth daily as needed for mild constipation.      metFORMIN  (GLUCOPHAGE -XR) 750 MG 24 hr tablet Take 1 tablet (750 mg total) by mouth daily with breakfast.      nicotine  (NICODERM CQ  - DOSED IN MG/24 HOURS) 21 mg/24hr patch Place 1 patch (21 mg total) onto the skin daily.      nicotine  polacrilex (NICORETTE ) 2 MG gum Take 1 each (2 mg total) by mouth as needed for smoking cessation.      OLANZapine  (ZYPREXA ) injection Inject 1 mL (5 mg total) into the muscle 3 (three) times daily as needed for agitation (severe agitation - attacking objects, kicking, hitting,  throwing, damaging objects. Self-injury or assault to others.).      OLANZapine  zydis (ZYPREXA ) 5 MG disintegrating tablet Take 1 tablet (5 mg total) by mouth 3 (three) times daily as needed (moderate agitation - physical or verbal threats to self, property or others. Threatening or intimidating stance, gestures or language.).      ondansetron  (ZOFRAN -ODT) 4 MG disintegrating tablet Take 1 tablet (4 mg total) by mouth every 6 (six) hours as needed for nausea or vomiting.      thiamine  (VITAMIN B-1) 100 MG tablet Take 1 tablet (100 mg total) by mouth daily.       Patient Stressors: Health problems   Medication change or noncompliance    Patient Strengths: Communication skills   Treatment Modalities: Medication Management, Group therapy, Case management,  1 to 1 session with clinician, Psychoeducation, Recreational therapy.   Physician Treatment Plan for Primary Diagnosis: Borderline personality disorder (HCC) Long Term Goal(s): Improvement in symptoms so as ready for discharge   Short Term Goals: Ability to identify changes in lifestyle to reduce recurrence of condition will improve Ability to verbalize feelings will improve Ability to disclose and discuss suicidal ideas Ability to demonstrate self-control will improve Ability to identify and develop effective coping behaviors will improve  Medication Management: Evaluate patient's response, side effects, and tolerance of medication regimen.  Therapeutic Interventions: 1 to 1 sessions, Unit Group sessions and Medication administration.  Evaluation of Outcomes: Not Progressing  Physician Treatment Plan for Secondary Diagnosis: Principal Problem:   Borderline personality disorder (HCC)  Long Term Goal(s): Improvement in symptoms so as ready for discharge   Short Term Goals: Ability to identify changes in lifestyle to reduce recurrence of condition will improve Ability to verbalize feelings will improve Ability to disclose and  discuss suicidal ideas Ability to demonstrate self-control will improve Ability to identify and develop effective coping behaviors will improve     Medication Management: Evaluate patient's response, side effects, and tolerance of medication regimen.  Therapeutic Interventions: 1 to 1 sessions, Unit Group sessions and Medication administration.  Evaluation of Outcomes: Not Progressing   RN Treatment Plan for Primary Diagnosis: Borderline personality disorder (HCC) Long Term Goal(s): Knowledge of disease and therapeutic regimen to maintain health will improve  Short Term Goals: Ability to remain free from injury will improve, Ability to verbalize frustration and anger appropriately will improve, Ability  to demonstrate self-control, Ability to participate in decision making will improve, Ability to verbalize feelings will improve, Ability to disclose and discuss suicidal ideas, Ability to identify and develop effective coping behaviors will improve, and Compliance with prescribed medications will improve  Medication Management: RN will administer medications as ordered by provider, will assess and evaluate patient's response and provide education to patient for prescribed medication. RN will report any adverse and/or side effects to prescribing provider.  Therapeutic Interventions: 1 on 1 counseling sessions, Psychoeducation, Medication administration, Evaluate responses to treatment, Monitor vital signs and CBGs as ordered, Perform/monitor CIWA, COWS, AIMS and Fall Risk screenings as ordered, Perform wound care treatments as ordered.  Evaluation of Outcomes: Not Progressing   LCSW Treatment Plan for Primary Diagnosis: Borderline personality disorder (HCC) Long Term Goal(s): Safe transition to appropriate next level of care at discharge, Engage patient in therapeutic group addressing interpersonal concerns.  Short Term Goals: Engage patient in aftercare planning with referrals and resources,  Increase social support, Increase ability to appropriately verbalize feelings, Increase emotional regulation, Facilitate acceptance of mental health diagnosis and concerns, Facilitate patient progression through stages of change regarding substance use diagnoses and concerns, Identify triggers associated with mental health/substance abuse issues, and Increase skills for wellness and recovery  Therapeutic Interventions: Assess for all discharge needs, 1 to 1 time with Social worker, Explore available resources and support systems, Assess for adequacy in community support network, Educate family and significant other(s) on suicide prevention, Complete Psychosocial Assessment, Interpersonal group therapy.  Evaluation of Outcomes: Not Progressing   Progress in Treatment: Attending groups: No. Participating in groups: No. Taking medication as prescribed: No. Toleration medication: No. Family/Significant other contact made: No, will contact:  CSW will contact if given permission  Patient understands diagnosis: No. Discussing patient identified problems/goals with staff: No. Medical problems stabilized or resolved: No. Denies suicidal/homicidal ideation: Yes. Issues/concerns per patient self-inventory: No. Other: None   New problem(s) identified: No, Describe:  None identified   New Short Term/Long Term Goal(s): detox, elimination of symptoms of psychosis, medication management for mood stabilization; elimination of SI thoughts; development of comprehensive mental wellness/sobriety plan.   Patient Goals:  Pt declined goals   Discharge Plan or Barriers: CSW will assist with appropriate discharge planning   Reason for Continuation of Hospitalization: Medical Issues Medication stabilization  Estimated Length of Stay: 1 to 7 days   Last 3 [place] Suicide Severity Risk Score: Flowsheet Row Admission (Current) from 02/14/2024 in [facility] Admission (Discharged) [facility] from 02/10/2024 in [facility]  C-SSRS RISK CATEGORY No Risk No Risk No Risk    Last PHQ 2/9 Scores:    02/05/2024    1:11 PM 02/06/2016    3:41 PM 12/28/2015    9:41 AM  Depression screen PHQ 2/9  Decreased Interest 3 0 0  Down, Depressed, Hopeless 3 0 0  PHQ - 2 Score 6 0 0  Altered sleeping 3    Tired, decreased energy 0    Change in appetite 0    Feeling bad or failure about yourself  0    Trouble concentrating 0    Moving slowly or fidgety/restless 3    Suicidal thoughts 0    PHQ-9 Score 12      Scribe for Treatment Team: Lum [name], [place] 02/15/2024 4:04 PM

## 2024-02-15 NOTE — Plan of Care (Signed)
   Problem: Education: Goal: Ability to state activities that reduce stress will improve Outcome: Progressing   Problem: Coping: Goal: Ability to identify and develop effective coping behavior will improve Outcome: Progressing   Problem: Self-Concept: Goal: Ability to identify factors that promote anxiety will improve Outcome: Progressing Goal: Level of anxiety will decrease Outcome: Progressing Goal: Ability to modify response to factors that promote anxiety will improve Outcome: Progressing

## 2024-02-15 NOTE — Progress Notes (Addendum)
 Patient was up and visible on the unit at the start of the shift. Upon presentation, patient was guarded, easily agitation, and apprehensive to complying with any aspect of nursing care offered. Patient c/o back, neck, and headache pain. Requested pain injection. Patient offered PRN toradol  30mg  IM at the start of the shift. Patient eventually calmed down, became more free to communicate with staff in a more pleasant manner. After consuming evening snack patient had one episode of emesis. After much encouragement patient complied with scheduled ativan  1mg  and zofran  4mg  P.O.. Upon re-assessment patient appeared calm and no longer displaying N/V symptoms, voiced relief. CIWA scoring unremarkable, denies s/s of withdrawal.  Patient remains on supplemental O2 via [place] at 3-4L, PRN. All orders maintained as written.   Estimated Sleeping Duration (Last 24 Hours): 3.75-5.75 hours      02/15/24 0029  Psych Admission Type (Psych Patients Only)  Admission Status Involuntary  Psychosocial Assessment  Patient Complaints Anxiety;Irritability  Eye Contact Fair  Facial Expression Animated  Affect Irritable;Fearful  Speech Logical/coherent  Interaction Assertive  Motor Activity Fidgety  Appearance/Hygiene Disheveled  Behavior Characteristics Anxious;Irritable  Mood Anxious;Irritable  Thought Process  Coherency Circumstantial  Content Preoccupation;Blaming self  Delusions None reported or observed  Perception WDL  Hallucination None reported or observed  Judgment WDL  Confusion Mild  Danger to Self  Current suicidal ideation? Denies  Agreement Not to Harm Self Yes  Description of Agreement verbal  Danger to Others  Danger to Others None reported or observed

## 2024-02-15 NOTE — Group Note (Signed)
 Recreation Therapy Group Note   Group Topic:Coping Skills  Group Date: 02/15/2024 Start Time: 1410 End Time: 1500 Facilitators: Celestia Jeoffrey BRAVO, LRT, CTRS Location: Dayroom   Group Description: Mind Map.  Patient was provided a blank template of a diagram with 32 blank boxes in a tiered system, branching from the center (similar to a bubble chart). LRT directed patients to label the middle of the diagram Coping Skills. LRT and patients then came up with 8 different coping skills as examples. Pt were directed to record their coping skills in the 2nd tier boxes closest to the center.  Patients would then share their coping skills with the group as LRT wrote them out. LRT gave a handout of 99 different coping skills at the end of group.   Goal Area(s) Addressed: Patients will be able to define "coping skills". Patient will identify new coping skills.  Patient will increase communication.   Affect/Mood: N/A   Participation Level: Did not attend    Clinical Observations/Individualized Feedback: Patient did not attend group.   Plan: Continue to engage patient in RT group sessions 2-3x/week.   Jeoffrey BRAVO Celestia, LRT, CTRS 02/15/2024 5:12 PM

## 2024-02-15 NOTE — Group Note (Signed)
 Date:  02/15/2024 Time:  11:37 PM  Group Topic/Focus:  Wrap-Up Group:   The focus of this group is to help patients review their daily goal of treatment and discuss progress on daily workbooks.    Participation Level:  Did Not Attend  Participation Quality:     Affect:     Cognitive:     Insight: None  Engagement in Group:  None  Modes of Intervention:     Additional Comments:    Tommas CHRISTELLA Bunker 02/15/2024, 11:37 PM

## 2024-02-15 NOTE — BHH Counselor (Signed)
 CSW contacted the [facility] [employer] division to verify if pt has a legal guardian as there is currently no paperwork on file but CSW was informed by nursing report that [facility] told them that pt has a legal guardian.   [employer] reports that pt does NOT have a legal guardian at this time.   CSW asks that th order be sent to CSW via email.   They report they will send to CSW as soon as possible.   CSW will inform provider.   Lum Croft, MSW, [place] 02/15/2024 11:18 AM

## 2024-02-15 NOTE — H&P (Signed)
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 personality disorder (HCC)  Long Term Goal(s): Improvement in symptoms so as ready for discharge  Short Term Goals: Ability to identify changes in lifestyle to reduce recurrence of condition will improve, Ability to verbalize feelings will improve, Ability to disclose and discuss suicidal ideas, Ability to demonstrate self-control will improve, and Ability to identify and develop effective coping behaviors will improve  I certify that inpatient services furnished can reasonably be expected to improve the patient's condition.    Gianfranco Araki, MD 8/25/20259:10 AM

## 2024-02-15 NOTE — Progress Notes (Signed)
 Patient yelling and upset that she could not get another snack. She stated call security ima tear this mother fucker down. B52 given without incident.

## 2024-02-15 NOTE — BHH Suicide Risk Assessment (Signed)
 [facility] Admission Suicide Risk Assessment   Nursing information obtained from:  Patient Demographic factors:  Living alone Current Mental Status:  NA Loss Factors:  NA Historical Factors:  NA Risk Reduction Factors:  NA  Total Time spent with patient: 30 minutes Principal Problem: Borderline personality disorder (HCC) Diagnosis:  Principal Problem:   Borderline personality disorder (HCC)  Subjective Data: [name] is a 58 y.o. AA female with a past psychiatric history of schizoaffective disorder, bipolar, schizophrenia, substance-induced mood disorder/psychosis, alcohol  use disorder severe, polysubstance abuse (I.e., though largely crack cocaine), and MDD, with pertinent medical comorbidities/history that includes COPD, atrial fibrillation, seizures, diabetes, and DDD, who presents this encounter by way of [employer] under involuntary commitment taken out by family for concerns for possible suicide attempt by way of overdose, who upon arrival and EDP evaluation, consulted psychiatry for specialty evaluation and recommendations. Patient is currently involuntarily committed at this time Patient is admitted to [facility] unit with Q15 min safety monitoring. Multidisciplinary team approach is offered. Medication management; group/milieu therapy is offered.   Continued Clinical Symptoms:  Alcohol  Use Disorder Identification Test Final Score (AUDIT): 34 The Alcohol  Use Disorders Identification Test, Guidelines for Use in Primary Care, Second Edition.  World Science writer [facility]). Score between 0-7:  no or low risk or alcohol  related problems. Score between 8-15:  moderate risk of alcohol  related problems. Score between 16-19:  high risk of alcohol  related problems. Score 20 or above:  warrants further diagnostic evaluation for alcohol  dependence and treatment.   CLINICAL FACTORS:   Alcohol Starr Abuse/Dependencies   Musculoskeletal: Strength & Muscle Tone: within normal limits Gait &  Station: normal Patient leans: N/A  Psychiatric Specialty Exam:  Presentation  General Appearance:  Appropriate for Environment; Casual  Eye Contact: Fair  Speech: Clear and Coherent  Speech Volume: Increased  Handedness: Right   Mood and Affect  Mood: Irritable; Labile  Affect: Labile   Thought Process  Thought Processes: Irrevelant  Descriptions of Associations:Intact  Orientation:Full (Time, Place and Person)  Thought Content:Illogical  History of Schizophrenia/Schizoaffective disorder:Yes  Duration of Psychotic Symptoms:Greater than six months  Hallucinations:Hallucinations: None  Ideas of Reference:None  Suicidal Thoughts:Suicidal Thoughts: No  Homicidal Thoughts:Homicidal Thoughts: No   Sensorium  Memory: Immediate Fair; Remote Fair; Recent Fair  Judgment: Impaired  Insight: Shallow   Executive Functions  Concentration: Poor  Attention Span: Poor  Recall: [employer] of Knowledge: Fair  Language: Fair   Psychomotor Activity  Psychomotor Activity:Psychomotor Activity: Normal   Assets  Assets: Communication Skills; Resilience; Social Support   Sleep  Sleep:Sleep: Fair    Physical Exam: Physical Exam Vitals and nursing note reviewed.    ROS Blood pressure (!) 143/91, pulse 96, temperature (!) 97.2 F (36.2 C), temperature source Temporal, resp. rate 18, height 5' 1 (1.549 m), weight 56.7 kg, last menstrual period 01/08/2011, SpO2 100%. Body mass index is 23.62 kg/m.   COGNITIVE FEATURES THAT CONTRIBUTE TO RISK:  None    SUICIDE RISK:   Minimal: No identifiable suicidal ideation.  Patients presenting with no risk factors but with morbid ruminations; may be classified as minimal risk based on the severity of the depressive symptoms  PLAN OF CARE: Patient is admitted to [facility] psych unit with Q15 min safety monitoring. Multidisciplinary team approach is offered. Medication management; group/milieu therapy is  offered.   I certify that inpatient services furnished can reasonably be expected to improve the patient's condition.   Allyn Foil, MD 02/15/2024, 10:30 PM

## 2024-02-16 DIAGNOSIS — F603 Borderline personality disorder: Secondary | ICD-10-CM | POA: Diagnosis not present

## 2024-02-16 DIAGNOSIS — F1020 Alcohol dependence, uncomplicated: Secondary | ICD-10-CM | POA: Diagnosis not present

## 2024-02-16 MED ORDER — TRAZODONE HCL 50 MG PO TABS
50.0000 mg | ORAL_TABLET | Freq: Every day | ORAL | Status: DC
  Administered 2024-02-16 – 2024-02-18 (×3): 50 mg via ORAL
  Filled 2024-02-16 (×3): qty 1

## 2024-02-16 NOTE — Group Note (Signed)
 Date:  02/16/2024 Time:  11:20 AM  Group Topic/Focus:  Emotional Education:   The focus of this group is to discuss what feelings/emotions are, and how they are experienced.    Participation Level:  Active  Participation Quality:  Appropriate  Affect:  Appropriate  Cognitive:  Appropriate  Insight: Appropriate  Engagement in Group:  Engaged  Modes of Intervention:  Activity  Additional Comments:    Camellia HERO Domnic Vantol 02/16/2024, 11:20 AM

## 2024-02-16 NOTE — Group Note (Signed)
 Date:  02/16/2024 Time:  11:42 PM  Group Topic/Focus:  Wrap-Up Group:   The focus of this group is to help patients review their daily goal of treatment and discuss progress on daily workbooks.    Participation Level:  Minimal  Participation Quality:  Attentive  Affect:  Appropriate  Cognitive:  Oriented  Insight: Limited  Engagement in Group:  Limited  Modes of Intervention:  Discussion  Additional Comments:    [name] 02/16/2024, 11:42 PM

## 2024-02-16 NOTE — Progress Notes (Signed)
 [facility] MD Progress Note  02/16/2024 10:01 PM [name]  MRN:  [id number] [name] is a 58 y.o. AA female with a past psychiatric history of schizoaffective disorder, bipolar, schizophrenia, substance-induced mood disorder/psychosis, alcohol  use disorder severe, polysubstance abuse (I.e., though largely crack cocaine), and MDD, with pertinent medical comorbidities/history that includes COPD, atrial fibrillation, seizures, diabetes, and DDD, who presents this encounter by way of [employer] under involuntary commitment taken out by family for concerns for possible suicide attempt by way of overdose, who upon arrival and EDP evaluation, consulted psychiatry for specialty evaluation and recommendations. Patient is currently involuntarily committed at this time Patient is admitted to [facility] unit with Q15 min safety monitoring. Multidisciplinary team approach is offered. Medication management; group/milieu therapy is offered.   Subjective:  Chart reviewed, case discussed in multidisciplinary meeting, patient seen during rounds.  On interview patient is much calm cooperative today.  Per social worker patient participated in groups and was very meaningful.  Patient reports that she took her medication and feels much calmer.  Provider discussed about Abilify  and Depakote  being added to her regimen for mood stabilization and Depakote  also helping with her history of seizures.  Patient expressed her understanding and denies having any side effects to the new medications.  She denies auditory/visual hallucinations and denies SI/HI/plan.  She remains fixated on her discharge but understands that she needs to participate in the programming.   Sleep: Poor  Appetite:  Fair  Past Psychiatric History: see h&P Family History:  Family History  Problem Relation Age of Onset   Diabetes Mother    Heart disease Mother    Hypertension Mother    Heart attack Father        84s   Heart attack Sister        74   COPD  Other    Breast cancer Neg Hx    Social History:  Social History   Substance and Sexual Activity  Alcohol  Use Yes   Comment: 80 ounces daily     Social History   Substance and Sexual Activity  Drug Use Yes   Types: Cocaine, Marijuana, Crack cocaine   Comment: last use- 03/19/2023    Social History   Socioeconomic History   Marital status: Widowed    Spouse name: Not on file   Number of children: 3   Years of education: Not on file   Highest education level: GED or equivalent  Occupational History   Not on file  Tobacco Use   Smoking status: Every Day    Current packs/day: 1.00    Types: Cigarettes   Smokeless tobacco: Never  Vaping Use   Vaping status: Never Used  Substance and Sexual Activity   Alcohol  use: Yes    Comment: 80 ounces daily   Drug use: Yes    Types: Cocaine, Marijuana, Crack cocaine    Comment: last use- 03/19/2023   Sexual activity: Not Currently    Partners: Male  Other Topics Concern   Not on file  Social History Narrative   Not on file   Social Drivers of Health   Financial Resource Strain: Low Risk  (02/05/2024)   Overall Financial Resource Strain (CARDIA)    Difficulty of Paying Living Expenses: Not hard at all  Food Insecurity: No Food Insecurity (02/14/2024)   Hunger Vital Sign    Worried About Running Out of Food in the Last Year: Never true    Ran Out of Food in the Last Year:  Never true  Transportation Needs: No Transportation Needs (02/14/2024)   PRAPARE - Administrator, Civil Service (Medical): No    Lack of Transportation (Non-Medical): No  Physical Activity: Inactive (02/05/2024)   Exercise Vital Sign    Days of Exercise per Week: 0 days    Minutes of Exercise per Session: Not on file  Stress: Stress Concern Present (02/05/2024)   [employer] of Occupational Health - Occupational Stress Questionnaire    Feeling of Stress: Very much  Social Connections: Socially Isolated (02/05/2024)   Social Connection  and Isolation Panel    Frequency of Communication with Friends and Family: More than three times a week    Frequency of Social Gatherings with Friends and Family: More than three times a week    Attends Religious Services: Never    Database administrator or Organizations: No    Attends Banker Meetings: Not on file    Marital Status: Widowed   Past Medical History:  Past Medical History:  Diagnosis Date   Abdominal pain    Accidental drug overdose April 2013   Anxiety    Atrial fibrillation (HCC) 09/29/11   converted spontaneously   Chronic back pain    Chronic knee pain    Chronic nausea    Chronic pain    COPD (chronic obstructive pulmonary disease) (HCC)    Depression    Diabetes mellitus    states her doctor took her off all DM meds in past month   Diabetic neuropathy (HCC)    Dyspnea    with exertion    GERD (gastroesophageal reflux disease)    Headache(784.0)    migraines    HTN (hypertension)    not on meds since in a year    Hyperlipidemia    Hypothyroidism    not on meds in a while    Mental disorder    Bipolar and schizophrenic   Nausea and vomiting 01/02/2023   Requires supplemental oxygen     as needed per patient    Schizophrenia (HCC)    Schizophrenia, acute (HCC) 11/13/2017   Tobacco abuse     Past Surgical History:  Procedure Laterality Date   ABDOMINAL HYSTERECTOMY     BLADDER SUSPENSION  03/04/2011   Procedure: [facility] PROCEDURE;  Surgeon: Glendia LABOR MacDiarmid;  Location: [facility];  Service: Urology;  Laterality: N/A;   BOWEL RESECTION N/A 04/18/2022   Procedure: SMALL BOWEL RESECTION;  Surgeon: Vanderbilt Ned, MD;  Location: [facility] OR;  Service: General;  Laterality: N/A;   CYSTOCELE REPAIR  03/04/2011   Procedure: ANTERIOR REPAIR (CYSTOCELE);  Surgeon: Glendia LABOR Elizabeth;  Location: [facility];  Service: Urology;  Laterality: N/A;   CYSTOSCOPY  03/04/2011   Procedure: CYSTOSCOPY;  Surgeon: Glendia LABOR MacDiarmid;  Location: [facility];  Service: Urology;   Laterality: N/A;   ESOPHAGOGASTRODUODENOSCOPY (EGD) WITH PROPOFOL  N/A 05/12/2017   Procedure: ESOPHAGOGASTRODUODENOSCOPY (EGD) WITH PROPOFOL ;  Surgeon: Ethyl Lenis, MD;  Location: [facility];  Service: General;  Laterality: N/A;   GASTRIC ROUX-EN-Y N/A 03/25/2016   Procedure: LAPAROSCOPIC ROUX-EN-Y GASTRIC BYPASS WITH UPPER ENDOSCOPY;  Surgeon: Morene Olives, MD;  Location: [facility];  Service: General;  Laterality: N/A;   KNEE SURGERY     LAPAROSCOPIC ASSISTED VAGINAL HYSTERECTOMY  03/04/2011   Procedure: LAPAROSCOPIC ASSISTED VAGINAL HYSTERECTOMY;  Surgeon: Rosaline LITTIE Cobble, MD;  Location: [facility];  Service: Gynecology;  Laterality: N/A;   LAPAROTOMY N/A 04/18/2022   Procedure: EXPLORATORY LAPAROTOMY;  Surgeon: Vanderbilt Ned, MD;  Location: [facility];  Service: General;  Laterality: N/A;   LAPAROTOMY N/A 04/24/2022   Procedure: BRING BACK EXPLORATORY LAPAROTOMY;  Surgeon: Sebastian Moles, MD;  Location: [facility] OR;  Service: General;  Laterality: N/A;   TOTAL HIP ARTHROPLASTY Right 08/27/2022   Procedure: TOTAL HIP ARTHROPLASTY;  Surgeon: Edna Toribio LABOR, MD;  Location: [facility];  Service: Orthopedics;  Laterality: Right;    Current Medications: Current Facility-Administered Medications  Medication Dose Route Frequency Provider Last Rate Last Admin   albuterol  (VENTOLIN  HFA) 108 (90 Base) MCG/ACT inhaler 2 puff  2 puff Inhalation Q6H PRN Bobbitt, Shalon E, NP   2 puff at 02/16/24 1828   ARIPiprazole  (ABILIFY ) tablet 5 mg  5 mg Oral Daily Jamicia Haaland, MD   5 mg at 02/16/24 9148   haloperidol  (HALDOL ) tablet 5 mg  5 mg Oral TID PRN Butler, Laura N, MD   5 mg at 02/15/24 1131   And   diphenhydrAMINE  (BENADRYL ) capsule 50 mg  50 mg Oral TID PRN Butler, Laura N, MD   50 mg at 02/15/24 1131   haloperidol  lactate (HALDOL ) injection 5 mg  5 mg Intramuscular TID PRN Butler, Laura N, MD   5 mg at 02/15/24 1609   And   diphenhydrAMINE  (BENADRYL ) injection 50 mg  50 mg Intramuscular TID PRN Butler,  Laura N, MD   50 mg at 02/15/24 1608   And   LORazepam  (ATIVAN ) injection 2 mg  2 mg Intramuscular TID PRN Butler, Laura N, MD   2 mg at 02/15/24 1609   divalproex  (DEPAKOTE  ER) 24 hr tablet 500 mg  500 mg Oral Daily Lasheena Frieze, MD   500 mg at 02/16/24 9147   hydrOXYzine  (ATARAX ) tablet 25 mg  25 mg Oral Q6H PRN Shrivastava, Aryendra, MD   25 mg at 02/16/24 1555   ibuprofen  (ADVIL ) tablet 400 mg  400 mg Oral Q6H PRN Bobbitt, Shalon E, NP   400 mg at 02/16/24 1452   ketorolac  (TORADOL ) injection 30 mg  30 mg Intramuscular Q6H PRN Shrivastava, Aryendra, MD   30 mg at 02/16/24 1931   loperamide  (IMODIUM ) capsule 2-4 mg  2-4 mg Oral PRN Shrivastava, Aryendra, MD       LORazepam  (ATIVAN ) tablet 1 mg  1 mg Oral Q6H PRN Shrivastava, Aryendra, MD       LORazepam  (ATIVAN ) tablet 1 mg  1 mg Oral BID Shrivastava, Aryendra, MD   1 mg at 02/16/24 2116   Followed by   NOREEN ON 02/18/2024] LORazepam  (ATIVAN ) tablet 1 mg  1 mg Oral Daily Shrivastava, Aryendra, MD       nicotine  (NICODERM CQ  - dosed in mg/24 hours) patch 21 mg  21 mg Transdermal Daily Dicky Boer, MD   21 mg at 02/16/24 9147   ondansetron  (ZOFRAN -ODT) disintegrating tablet 4 mg  4 mg Oral Q6H PRN Shrivastava, Aryendra, MD   4 mg at 02/16/24 2115   thiamine  (VITAMIN B1) injection 100 mg  100 mg Intramuscular Once Shrivastava, Aryendra, MD       thiamine  (VITAMIN B1) tablet 100 mg  100 mg Oral Daily Shrivastava, Aryendra, MD   100 mg at 02/16/24 9147   traZODone  (DESYREL ) tablet 50 mg  50 mg Oral QHS Landri Dorsainvil, MD   50 mg at 02/16/24 2115    Lab Results:  Results for orders placed or performed during the hospital encounter of 02/14/24 (from the past 48 hours)  Glucose, capillary     Status: Abnormal   Collection Time:  02/15/24  3:07 PM  Result Value Ref Range   Glucose-Capillary 111 (H) 70 - 99 mg/dL    Comment: Glucose reference range applies only to samples taken after fasting for at least 8 hours.    Blood Alcohol  level:   Lab Results  Component Value Date   ETH <15 02/12/2024   ETH 132 (H) 12/01/2023    Metabolic Disorder Labs: Lab Results  Component Value Date   HGBA1C 7.4 (H) 02/05/2024   MPG 166 02/05/2024   MPG 139.85 11/05/2023   Lab Results  Component Value Date   PROLACTIN 32.8 (H) 11/15/2017   Lab Results  Component Value Date   CHOL 184 02/05/2024   TRIG 70 02/05/2024   HDL 110 02/05/2024   CHOLHDL 1.7 02/05/2024   VLDL 8 11/05/2023   LDLCALC 59 02/05/2024   LDLCALC 45 11/05/2023    Physical Findings: AIMS:  , ,  ,  ,    CIWA:  CIWA-Ar Total: 2 COWS:      Psychiatric Specialty Exam:  Presentation  General Appearance:  Appropriate for Environment; Casual  Eye Contact: Fair  Speech: Clear and Coherent  Speech Volume: Increased    Mood and Affect  Mood: Irritable; Labile  Affect: Labile   Thought Process  Thought Processes: Irrevelant  Descriptions of Associations:Intact  Orientation:Full (Time, Place and Person)  Thought Content:Illogical  Hallucinations:Hallucinations: None  Ideas of Reference:None  Suicidal Thoughts:Suicidal Thoughts: No  Homicidal Thoughts:Homicidal Thoughts: No   Sensorium  Memory: Immediate Fair; Remote Fair; Recent Fair  Judgment: Impaired  Insight: Shallow   Executive Functions  Concentration: Poor  Attention Span: Poor  Recall: [employer] of Knowledge: Fair  Language: Fair   Psychomotor Activity  Psychomotor Activity: Psychomotor Activity: Normal  Musculoskeletal: Strength & Muscle Tone: within normal limits Gait & Station: normal Assets  Assets: Manufacturing systems engineer; Resilience; Social Support    Physical Exam: Physical Exam ROS Blood pressure (!) 143/96, pulse 99, temperature 98.2 F (36.8 C), resp. rate 14, height 5' 1 (1.549 m), weight 56.7 kg, last menstrual period 01/08/2011, SpO2 97%. Body mass index is 23.62 kg/m.  Diagnosis: Principal Problem:   Borderline  personality disorder (HCC)  Principal Diagnosis: Borderline personality disorder (HCC) Diagnosis:  Principal Problem:   Borderline personality disorder (HCC)  Alcohol  use disorder severe R/O Bipolar I disorder   Clinical Decision Making: Patient with history of depression, severe alcohol  use disorder, previous history of aggressive behaviors admitted to geropsych unit to be monitored s/p alleged overdose by family and aggressive behaviors by the PD that arrived on the scene at home.  Patient is displaying aggressive and impulsive behaviors on the unit receiving multiple as needed medications.  Patient will be observed closely   Treatment Plan Summary:   Safety and Monitoring:             -- Voluntary admission to inpatient psychiatric unit for safety, stabilization and treatment             -- Daily contact with patient to assess and evaluate symptoms and progress in treatment             -- Patient's case to be discussed in multi-disciplinary team meeting             -- Observation Level: q15 minute checks             -- Vital signs:  q12 hours             -- Precautions: suicide, elopement,  and assault   2. Psychiatric Diagnoses and Treatment:               Patient was on Abilify  before.  Restarted patient on Abilify  5 mg for mood stabilization Added Depakote  ER 500 mg for mood stabilization and to prevent any withdrawal seizures. If patient refuses medications orally will initiate nonemergent forced medication   -- The risks/benefits/side-effects/alternatives to this medication were discussed in detail with the patient and time was given for questions. The patient consents to medication trial.                -- Metabolic profile and EKG monitoring obtained while on an atypical antipsychotic (BMI: Lipid Panel: HbgA1c: QTc:)              -- Encouraged patient to participate in unit milieu and in scheduled group therapies                            3. Medical Issues Being Addressed:     CIWA protocol and Ativan  taper  4. Discharge Planning:   -- Social work and case management to assist with discharge planning and identification of hospital follow-up needs prior to discharge  -- Estimated LOS: 3-4 days  Allyn Foil, MD 02/16/2024, 10:01 PM

## 2024-02-16 NOTE — Group Note (Signed)
 [facility] LCSW Group Therapy Note   Group Date: 02/16/2024 Start Time: 1315 End Time: 1400   Type of Therapy/Topic:  Group Therapy:  Emotion Regulation  Participation Level:  Active   Mood:  Description of Group:    The purpose of this group is to assist patients in learning to regulate negative emotions and experience positive emotions. Patients will be guided to discuss ways in which they have been vulnerable to their negative emotions. These vulnerabilities will be juxtaposed with experiences of positive emotions or situations, and patients challenged to use positive emotions to combat negative ones. Special emphasis will be placed on coping with negative emotions in conflict situations, and patients will process healthy conflict resolution skills.  Therapeutic Goals: Patient will identify two positive emotions or experiences to reflect on in order to balance out negative emotions:  Patient will label two or more emotions that they find the most difficult to experience:  Patient will be able to demonstrate positive conflict resolution skills through discussion or role plays:   Summary of Patient Progress:   Pt was active and appropriate throughout group. Pt reports that her daughter is in control of her money and she gets angry because she feels her daughter tries to be in control of everything else too.     Therapeutic Modalities:   Cognitive Behavioral Therapy Feelings Identification Dialectical Behavioral Therapy   Lum [name], LCSWA

## 2024-02-16 NOTE — Group Note (Signed)
 Recreation Therapy Group Note   Group Topic:Stress Management  Group Date: 02/16/2024 Start Time: 1400 End Time: 1500 Facilitators: Celestia Jeoffrey BRAVO, LRT, CTRS Location: Courtyard  Group Description: Stress Jenga. LRT and pts played games of Jenga. LRT prompted group discussion on the physical signs and symptoms of stress, similar to the feeling when you're playing Jenga and trying to remove a wooden block from the stack without it all collapsing. LRT and pt discussed the physical and mental signs of stress, as well as coping skills to manage them.   Goal Area(s) Addressed: Patient will identify physical symptoms of stress. Patient will identify coping skills for stress. Patient will build frustration tolerance skills.  Patient will increase communication.    Affect/Mood: N/A   Participation Level: Did not attend    Clinical Observations/Individualized Feedback: Patient did not attend group.   Plan: Continue to engage patient in RT group sessions 2-3x/week.   Jeoffrey BRAVO Celestia, LRT, CTRS 02/16/2024 5:16 PM

## 2024-02-16 NOTE — Plan of Care (Signed)
   Problem: Activity: Goal: Interest or engagement in activities will improve Outcome: Progressing   Problem: Health Behavior/Discharge Planning: Goal: Compliance with treatment plan for underlying cause of condition will improve Outcome: Progressing   Problem: Safety: Goal: Periods of time without injury will increase Outcome: Progressing

## 2024-02-16 NOTE — Group Note (Unsigned)
 Date:  02/16/2024 Time:  8:23 PM  Group Topic/Focus:  Emotional Education:   The focus of this group is to discuss what feelings/emotions are, and how they are experienced.     Participation Level:  {BHH PARTICIPATION OZCZO:77735}  Participation Quality:  {BHH PARTICIPATION QUALITY:22265}  Affect:  {BHH AFFECT:22266}  Cognitive:  {BHH COGNITIVE:22267}  Insight: {BHH Insight2:20797}  Engagement in Group:  {BHH ENGAGEMENT IN HMNLE:77731}  Modes of Intervention:  {BHH MODES OF INTERVENTION:22269}  Additional Comments:  ***  Jacqualyn FORBES Penton 02/16/2024, 8:23 PM

## 2024-02-16 NOTE — Progress Notes (Signed)
   02/16/24 0850  Psych Admission Type (Psych Patients Only)  Admission Status Involuntary  Psychosocial Assessment  Patient Complaints Anxiety  Eye Contact Fair  Facial Expression Animated  Affect Anxious  Speech Logical/coherent  Interaction Assertive  Motor Activity Slow  Appearance/Hygiene Unremarkable  Behavior Characteristics Cooperative  Mood Pleasant;Anxious  Thought Process  Coherency Circumstantial  Content WDL  Delusions None reported or observed  Perception WDL  Hallucination None reported or observed  Judgment Impaired  Confusion Mild  Danger to Self  Current suicidal ideation? Denies  Agreement Not to Harm Self Yes  Description of Agreement verbal  Danger to Others  Danger to Others None reported or observed

## 2024-02-17 DIAGNOSIS — F603 Borderline personality disorder: Secondary | ICD-10-CM | POA: Diagnosis not present

## 2024-02-17 DIAGNOSIS — F1020 Alcohol dependence, uncomplicated: Secondary | ICD-10-CM | POA: Diagnosis not present

## 2024-02-17 MED ORDER — LIDOCAINE 5 % EX PTCH
1.0000 | MEDICATED_PATCH | CUTANEOUS | Status: DC
  Administered 2024-02-17 – 2024-02-19 (×3): 1 via TRANSDERMAL
  Filled 2024-02-17 (×4): qty 1

## 2024-02-17 MED ORDER — NICOTINE POLACRILEX 2 MG MT GUM
2.0000 mg | CHEWING_GUM | OROMUCOSAL | Status: DC | PRN
  Administered 2024-02-17: 2 mg via ORAL
  Filled 2024-02-17: qty 1

## 2024-02-17 MED ORDER — ARIPIPRAZOLE 5 MG PO TABS
25.0000 mg | ORAL_TABLET | Freq: Once | ORAL | Status: AC
  Administered 2024-02-17: 25 mg via ORAL
  Filled 2024-02-17: qty 5

## 2024-02-17 MED ORDER — ONDANSETRON 4 MG PO TBDP
4.0000 mg | ORAL_TABLET | Freq: Four times a day (QID) | ORAL | Status: DC | PRN
  Administered 2024-02-17 – 2024-02-18 (×3): 4 mg via ORAL
  Filled 2024-02-17 (×3): qty 1

## 2024-02-17 NOTE — Group Note (Signed)
 Date:  02/17/2024 Time:  10:50 AM  Group Topic/Focus:  Fresh air therapy with music    Participation Level:  Active  Participation Quality:  Appropriate  Affect:  Appropriate  Cognitive:  Appropriate  Insight: Appropriate  Engagement in Group:  Engaged  Modes of Intervention:  Socialization  Additional Comments:  none  Norleen SHAUNNA Bias 02/17/2024, 10:50 AM

## 2024-02-17 NOTE — Progress Notes (Signed)
 Patient is an involuntary admission to [facility] for Bipolar disorder with ETOH abuse.  Patient can be labile but seemed much better today.  Friendly, cooperative, interacting well with peers and staff. Patient denies SI, HI, AVH, anxiety and depression.  Participated in group activities.  Will continue to monitor.

## 2024-02-17 NOTE — Progress Notes (Signed)
   02/16/24 2015  Psych Admission Type (Psych Patients Only)  Admission Status Involuntary  Psychosocial Assessment  Patient Complaints Anxiety  Eye Contact Fair  Facial Expression Animated  Affect Anxious  Speech Argumentative  Interaction Assertive;Needy  Motor Activity Slow  Appearance/Hygiene Unremarkable  Behavior Characteristics Cooperative;Anxious;Fidgety;Pacing;Restless  Mood Anxious  Thought Process  Coherency Circumstantial  Content WDL  Delusions None reported or observed  Perception WDL  Hallucination None reported or observed  Judgment Impaired  Confusion Mild  Danger to Self  Current suicidal ideation? Denies  Agreement Not to Harm Self Yes  Description of Agreement verbal  Danger to Others  Danger to Others None reported or observed

## 2024-02-17 NOTE — Plan of Care (Signed)
  Problem: Education: Goal: Ability to state activities that reduce stress will improve Outcome: Progressing   Problem: Coping: Goal: Ability to identify and develop effective coping behavior will improve Outcome: Progressing   Problem: Self-Concept: Goal: Ability to identify factors that promote anxiety will improve Outcome: Progressing Goal: Level of anxiety will decrease Outcome: Progressing Goal: Ability to modify response to factors that promote anxiety will improve Outcome: Progressing   Problem: Education: Goal: Knowledge of [place] [facility] Education information/materials will improve Outcome: Progressing Goal: Emotional status will improve Outcome: Progressing Goal: Mental status will improve Outcome: Progressing Goal: Verbalization of understanding the information provided will improve Outcome: Progressing   Problem: Activity: Goal: Interest or engagement in activities will improve Outcome: Progressing Goal: Sleeping patterns will improve Outcome: Progressing   Problem: Coping: Goal: Ability to verbalize frustrations and anger appropriately will improve Outcome: Progressing Goal: Ability to demonstrate self-control will improve Outcome: Progressing   Problem: Health Behavior/Discharge Planning: Goal: Identification of resources available to assist in meeting health care needs will improve Outcome: Progressing Goal: Compliance with treatment plan for underlying cause of condition will improve Outcome: Progressing   Problem: Physical Regulation: Goal: Ability to maintain clinical measurements within normal limits will improve Outcome: Progressing   Problem: Safety: Goal: Periods of time without injury will increase Outcome: Progressing   

## 2024-02-17 NOTE — Progress Notes (Signed)
 [facility] MD Progress Note  02/17/2024 3:33 PM [name]  MRN:  [id number] [name] is a 58 y.o. AA female with a past psychiatric history of schizoaffective disorder, bipolar, schizophrenia, substance-induced mood disorder/psychosis, alcohol  use disorder severe, polysubstance abuse (I.e., though largely crack cocaine), and MDD, with pertinent medical comorbidities/history that includes COPD, atrial fibrillation, seizures, diabetes, and DDD, who presents this encounter by way of [employer] under involuntary commitment taken out by family for concerns for possible suicide attempt by way of overdose, who upon arrival and EDP evaluation, consulted psychiatry for specialty evaluation and recommendations. Patient is currently involuntarily committed at this time Patient is admitted to [facility] unit with Q15 min safety monitoring. Multidisciplinary team approach is offered. Medication management; group/milieu therapy is offered.   Subjective:  Chart reviewed, case discussed in multidisciplinary meeting, patient seen during rounds.  Today patient is noted to be calm and cooperative.  Patient and provider discussed about increasing her Abilify  to 30 mg followed by LAI of Abilify  Maintena 300 mg tomorrow.  Patient did acknowledge the need for mood stabilization.  Patient reports that she is not ready to quit alcohol .  Patient and provider discussed harm reduction and cutting down on alcohol  gradually..  She denies SI/HI/plan denies auditory/visual hallucinations.   Sleep: Poor  Appetite:  Fair  Past Psychiatric History: see h&P Family History:  Family History  Problem Relation Age of Onset   Diabetes Mother    Heart disease Mother    Hypertension Mother    Heart attack Father        9s   Heart attack Sister        96   COPD Other    Breast cancer Neg Hx    Social History:  Social History   Substance and Sexual Activity  Alcohol  Use Yes   Comment: 80 ounces daily     Social History    Substance and Sexual Activity  Drug Use Yes   Types: Cocaine, Marijuana, Crack cocaine   Comment: last use- 03/19/2023    Social History   Socioeconomic History   Marital status: Widowed    Spouse name: Not on file   Number of children: 3   Years of education: Not on file   Highest education level: GED or equivalent  Occupational History   Not on file  Tobacco Use   Smoking status: Every Day    Current packs/day: 1.00    Types: Cigarettes   Smokeless tobacco: Never  Vaping Use   Vaping status: Never Used  Substance and Sexual Activity   Alcohol  use: Yes    Comment: 80 ounces daily   Drug use: Yes    Types: Cocaine, Marijuana, Crack cocaine    Comment: last use- 03/19/2023   Sexual activity: Not Currently    Partners: Male  Other Topics Concern   Not on file  Social History Narrative   Not on file   Social Drivers of Health   Financial Resource Strain: Low Risk  (02/05/2024)   Overall Financial Resource Strain (CARDIA)    Difficulty of Paying Living Expenses: Not hard at all  Food Insecurity: No Food Insecurity (02/14/2024)   Hunger Vital Sign    Worried About Running Out of Food in the Last Year: Never true    Ran Out of Food in the Last Year: Never true  Transportation Needs: No Transportation Needs (02/14/2024)   PRAPARE - Transportation    Lack of Transportation (Medical): No    Lack  of Transportation (Non-Medical): No  Physical Activity: Inactive (02/05/2024)   Exercise Vital Sign    Days of Exercise per Week: 0 days    Minutes of Exercise per Session: Not on file  Stress: Stress Concern Present (02/05/2024)   [employer] of Occupational Health - Occupational Stress Questionnaire    Feeling of Stress: Very much  Social Connections: Socially Isolated (02/05/2024)   Social Connection and Isolation Panel    Frequency of Communication with Friends and Family: More than three times a week    Frequency of Social Gatherings with Friends and Family: More  than three times a week    Attends Religious Services: Never    Database administrator or Organizations: No    Attends Banker Meetings: Not on file    Marital Status: Widowed   Past Medical History:  Past Medical History:  Diagnosis Date   Abdominal pain    Accidental drug overdose April 2013   Anxiety    Atrial fibrillation (HCC) 09/29/11   converted spontaneously   Chronic back pain    Chronic knee pain    Chronic nausea    Chronic pain    COPD (chronic obstructive pulmonary disease) (HCC)    Depression    Diabetes mellitus    states her doctor took her off all DM meds in past month   Diabetic neuropathy (HCC)    Dyspnea    with exertion    GERD (gastroesophageal reflux disease)    Headache(784.0)    migraines    HTN (hypertension)    not on meds since in a year    Hyperlipidemia    Hypothyroidism    not on meds in a while    Mental disorder    Bipolar and schizophrenic   Nausea and vomiting 01/02/2023   Requires supplemental oxygen     as needed per patient    Schizophrenia (HCC)    Schizophrenia, acute (HCC) 11/13/2017   Tobacco abuse     Past Surgical History:  Procedure Laterality Date   ABDOMINAL HYSTERECTOMY     BLADDER SUSPENSION  03/04/2011   Procedure: [facility] PROCEDURE;  Surgeon: Glendia LABOR MacDiarmid;  Location: [facility];  Service: Urology;  Laterality: N/A;   BOWEL RESECTION N/A 04/18/2022   Procedure: SMALL BOWEL RESECTION;  Surgeon: Vanderbilt Ned, MD;  Location: [facility] OR;  Service: General;  Laterality: N/A;   CYSTOCELE REPAIR  03/04/2011   Procedure: ANTERIOR REPAIR (CYSTOCELE);  Surgeon: Glendia LABOR Elizabeth;  Location: [facility];  Service: Urology;  Laterality: N/A;   CYSTOSCOPY  03/04/2011   Procedure: CYSTOSCOPY;  Surgeon: Glendia LABOR MacDiarmid;  Location: [facility];  Service: Urology;  Laterality: N/A;   ESOPHAGOGASTRODUODENOSCOPY (EGD) WITH PROPOFOL  N/A 05/12/2017   Procedure: ESOPHAGOGASTRODUODENOSCOPY (EGD) WITH PROPOFOL ;  Surgeon: Ethyl Lenis, MD;   Location: [facility];  Service: General;  Laterality: N/A;   GASTRIC ROUX-EN-Y N/A 03/25/2016   Procedure: LAPAROSCOPIC ROUX-EN-Y GASTRIC BYPASS WITH UPPER ENDOSCOPY;  Surgeon: Morene Olives, MD;  Location: [facility];  Service: General;  Laterality: N/A;   KNEE SURGERY     LAPAROSCOPIC ASSISTED VAGINAL HYSTERECTOMY  03/04/2011   Procedure: LAPAROSCOPIC ASSISTED VAGINAL HYSTERECTOMY;  Surgeon: Rosaline LITTIE Cobble, MD;  Location: [facility];  Service: Gynecology;  Laterality: N/A;   LAPAROTOMY N/A 04/18/2022   Procedure: EXPLORATORY LAPAROTOMY;  Surgeon: Vanderbilt Ned, MD;  Location: [facility];  Service: General;  Laterality: N/A;   LAPAROTOMY N/A 04/24/2022   Procedure: BRING BACK EXPLORATORY LAPAROTOMY;  Surgeon: Sebastian Moles,  MD;  Location: [facility];  Service: General;  Laterality: N/A;   TOTAL HIP ARTHROPLASTY Right 08/27/2022   Procedure: TOTAL HIP ARTHROPLASTY;  Surgeon: Edna Toribio LABOR, MD;  Location: [facility];  Service: Orthopedics;  Laterality: Right;    Current Medications: Current Facility-Administered Medications  Medication Dose Route Frequency Provider Last Rate Last Admin   albuterol  (VENTOLIN  HFA) 108 (90 Base) MCG/ACT inhaler 2 puff  2 puff Inhalation Q6H PRN Bobbitt, Shalon E, NP   2 puff at 02/17/24 1405   ARIPiprazole  (ABILIFY ) tablet 25 mg  25 mg Oral Once Gertrue Willette, MD       ARIPiprazole  (ABILIFY ) tablet 5 mg  5 mg Oral Daily Macaila Tahir, MD   5 mg at 02/17/24 9057   haloperidol  (HALDOL ) tablet 5 mg  5 mg Oral TID PRN Butler, Laura N, MD   5 mg at 02/15/24 1131   And   diphenhydrAMINE  (BENADRYL ) capsule 50 mg  50 mg Oral TID PRN Butler, Laura N, MD   50 mg at 02/15/24 1131   haloperidol  lactate (HALDOL ) injection 5 mg  5 mg Intramuscular TID PRN Butler, Laura N, MD   5 mg at 02/15/24 1609   And   diphenhydrAMINE  (BENADRYL ) injection 50 mg  50 mg Intramuscular TID PRN Butler, Laura N, MD   50 mg at 02/15/24 1608   And   LORazepam  (ATIVAN ) injection 2 mg  2 mg  Intramuscular TID PRN Butler, Laura N, MD   2 mg at 02/15/24 1609   divalproex  (DEPAKOTE  ER) 24 hr tablet 500 mg  500 mg Oral Daily Charlottie Peragine, MD   500 mg at 02/17/24 9053   ibuprofen  (ADVIL ) tablet 400 mg  400 mg Oral Q6H PRN Bobbitt, Shalon E, NP   400 mg at 02/17/24 0215   ketorolac  (TORADOL ) injection 30 mg  30 mg Intramuscular Q6H PRN Shrivastava, Aryendra, MD   30 mg at 02/16/24 1931   lidocaine  (LIDODERM ) 5 % 1 patch  1 patch Transdermal Q24H Donesha Wallander, MD   1 patch at 02/17/24 1112   [START ON 02/18/2024] LORazepam  (ATIVAN ) tablet 1 mg  1 mg Oral Daily Shrivastava, Aryendra, MD       nicotine  (NICODERM CQ  - dosed in mg/24 hours) patch 21 mg  21 mg Transdermal Daily Blythe Hartshorn, MD   21 mg at 02/17/24 9058   nicotine  polacrilex (NICORETTE ) gum 2 mg  2 mg Oral PRN Guillermo Nehring, MD   2 mg at 02/17/24 1011   thiamine  (VITAMIN B1) injection 100 mg  100 mg Intramuscular Once Shrivastava, Aryendra, MD       thiamine  (VITAMIN B1) tablet 100 mg  100 mg Oral Daily Shrivastava, Aryendra, MD   100 mg at 02/17/24 9056   traZODone  (DESYREL ) tablet 50 mg  50 mg Oral QHS Eliberto Sole, MD   50 mg at 02/16/24 2115    Lab Results:  No results found for this or any previous visit (from the past 48 hours).   Blood Alcohol  level:  Lab Results  Component Value Date   ETH <15 02/12/2024   ETH 132 (H) 12/01/2023    Metabolic Disorder Labs: Lab Results  Component Value Date   HGBA1C 7.4 (H) 02/05/2024   MPG 166 02/05/2024   MPG 139.85 11/05/2023   Lab Results  Component Value Date   PROLACTIN 32.8 (H) 11/15/2017   Lab Results  Component Value Date   CHOL 184 02/05/2024   TRIG 70 02/05/2024   HDL 110 02/05/2024  CHOLHDL 1.7 02/05/2024   VLDL 8 11/05/2023   LDLCALC 59 02/05/2024   LDLCALC 45 11/05/2023    Physical Findings: AIMS:  , ,  ,  ,    CIWA:  CIWA-Ar Total: 2 COWS:      Psychiatric Specialty Exam:  Presentation  General Appearance:  Appropriate for  Environment; Casual  Eye Contact: Fair  Speech: Normal Rate  Speech Volume: Normal    Mood and Affect  Mood: Euthymic  Affect: Appropriate   Thought Process  Thought Processes: Linear  Descriptions of Associations:Intact  Orientation:Full (Time, Place and Person)  Thought Content:Logical  Hallucinations:Hallucinations: None   Ideas of Reference:None  Suicidal Thoughts:Suicidal Thoughts: No   Homicidal Thoughts:Homicidal Thoughts: No    Sensorium  Memory: Immediate Fair; Recent Fair; Remote Fair  Judgment: Impaired  Insight: Shallow   Executive Functions  Concentration: Fair  Attention Span: Fair  Recall: [employer] of Knowledge: Fair  Language: Fair   Psychomotor Activity  Psychomotor Activity: Psychomotor Activity: Normal   Musculoskeletal: Strength & Muscle Tone: within normal limits Gait & Station: normal Assets  Assets: Manufacturing systems engineer; Desire for Improvement; Physical Health    Physical Exam: Physical Exam Vitals and nursing note reviewed.    ROS Blood pressure 131/76, pulse 89, temperature 98.1 F (36.7 C), resp. rate 17, height 5' 1 (1.549 m), weight 56.7 kg, last menstrual period 01/08/2011, SpO2 97%. Body mass index is 23.62 kg/m.  Diagnosis: Principal Problem:   Borderline personality disorder (HCC)  Principal Diagnosis: Borderline personality disorder (HCC) Diagnosis:  Principal Problem:   Borderline personality disorder (HCC)  Alcohol  use disorder severe R/O Bipolar I disorder   Clinical Decision Making: Patient with history of depression, severe alcohol  use disorder, previous history of aggressive behaviors admitted to geropsych unit to be monitored s/p alleged overdose by family and aggressive behaviors by the PD that arrived on the scene at home.  Patient is displaying aggressive and impulsive behaviors on the unit receiving multiple as needed medications.  Patient will be observed closely    Treatment Plan Summary:   Safety and Monitoring:             -- Voluntary admission to inpatient psychiatric unit for safety, stabilization and treatment             -- Daily contact with patient to assess and evaluate symptoms and progress in treatment             -- Patient's case to be discussed in multi-disciplinary team meeting             -- Observation Level: q15 minute checks             -- Vital signs:  q12 hours             -- Precautions: suicide, elopement, and assault   2. Psychiatric Diagnoses and Treatment:               Increased Abilify  to 30mg  for mood stabilization  Added Depakote  ER 500 mg for mood stabilization and to prevent any withdrawal seizures. If patient refuses medications orally will initiate nonemergent forced medication   -- The risks/benefits/side-effects/alternatives to this medication were discussed in detail with the patient and time was given for questions. The patient consents to medication trial.                -- Metabolic profile and EKG monitoring obtained while on an atypical antipsychotic (BMI: Lipid Panel: HbgA1c: QTc:)              --  Encouraged patient to participate in unit milieu and in scheduled group therapies                            3. Medical Issues Being Addressed:    CIWA protocol and Ativan  taper  4. Discharge Planning:   -- Social work and case management to assist with discharge planning and identification of hospital follow-up needs prior to discharge  -- Estimated LOS: 3-4 days  Sameria Morss, MD 02/17/2024, 3:33 PM

## 2024-02-18 DIAGNOSIS — F603 Borderline personality disorder: Secondary | ICD-10-CM | POA: Diagnosis not present

## 2024-02-18 DIAGNOSIS — F1020 Alcohol dependence, uncomplicated: Secondary | ICD-10-CM | POA: Diagnosis not present

## 2024-02-18 LAB — GLUCOSE, CAPILLARY: Glucose-Capillary: 141 mg/dL — ABNORMAL HIGH (ref 70–99)

## 2024-02-18 MED ORDER — ARIPIPRAZOLE ER 400 MG IM SRER
400.0000 mg | Freq: Once | INTRAMUSCULAR | Status: AC
  Administered 2024-02-18: 400 mg via INTRAMUSCULAR
  Filled 2024-02-18: qty 2

## 2024-02-18 MED ORDER — ARIPIPRAZOLE ER 400 MG IM SRER
300.0000 mg | Freq: Once | INTRAMUSCULAR | Status: DC
  Filled 2024-02-18: qty 2

## 2024-02-18 MED ORDER — BOOST / RESOURCE BREEZE PO LIQD CUSTOM
1.0000 | Freq: Three times a day (TID) | ORAL | Status: DC
  Administered 2024-02-18 – 2024-02-19 (×2): 1 via ORAL

## 2024-02-18 NOTE — Plan of Care (Signed)
  Problem: Coping: Goal: Ability to identify and develop effective coping behavior will improve Outcome: Progressing   Problem: Self-Concept: Goal: Ability to identify factors that promote anxiety will improve Outcome: Progressing Goal: Level of anxiety will decrease Outcome: Progressing   Problem: Education: Goal: Emotional status will improve Outcome: Progressing   Problem: Coping: Goal: Ability to demonstrate self-control will improve Outcome: Progressing

## 2024-02-18 NOTE — Group Note (Signed)
 Date:  02/18/2024 Time:  8:50 PM  Group Topic/Focus:  Wrap-Up Group:   The focus of this group is to help patients review their daily goal of treatment and discuss progress on daily workbooks.    Participation Level:  Active  Participation Quality:  Appropriate  Affect:  Appropriate  Cognitive:  Alert  Insight: Appropriate  Engagement in Group:  Engaged  Modes of Intervention:  Discussion  Additional Comments:    [name] 02/18/2024, 8:50 PM

## 2024-02-18 NOTE — Progress Notes (Signed)
 Patient has been up and visible on the unit at intervals all throughout the shift. Patient has voiced various somatic complaints throughout the night. At varying times, patient was offered PRN zofran  4mg  for c/o nausea, advil  400mg  for c/o back /leg pain, and benadryl  50mg  for c/o restless agitation. All PRN's noted to be effective.  Patient has displayed restless behaviors, frequently up at the nursing station making various demands. Patient eventually laid down towards the later part of the shift to rest. No further complaints voiced at this time. Safe behaviors displayed. No distress noted/ reported at this time. All orders maintained as written.    02/18/24 0149  Psych Admission Type (Psych Patients Only)  Admission Status Involuntary  Psychosocial Assessment  Patient Complaints Anxiety;Restlessness  Eye Contact Fair  Facial Expression Animated;Anxious  Affect Anxious  Speech Logical/coherent;Soft  Interaction Needy  Motor Activity Slow  Appearance/Hygiene Unremarkable  Behavior Characteristics Cooperative;Anxious  Mood Pleasant  Thought Process  Coherency Circumstantial  Content WDL  Delusions None reported or observed  Perception WDL  Hallucination None reported or observed  Judgment Impaired  Confusion Mild  Danger to Self  Current suicidal ideation? Denies  Agreement Not to Harm Self Yes  Description of Agreement verbal  Danger to Others  Danger to Others None reported or observed

## 2024-02-18 NOTE — Progress Notes (Addendum)
   02/17/24 1515  Spiritual Encounters  Type of Visit Follow up  Care provided to: Patient  Referral source Chaplain assessment  Reason for visit Routine spiritual support   This chaplain offered follow up support after group. Due to Alivia's recent loss of her husband (also unexpected and requiring CPR, requiring decision-making and adding trauma to the loss), I invited her to process this loss further. She debriefed around her tendency to cope by drinking.  I facilitated Alanya's naming of her grief and coping responses. I offered normalization of emotions and drew upon Worden's tasks of grief (eg, ways of connecting with lost spouse through considering what he might say to her, which for Raeven were words of love and encouragement). We touched upon SI and ways to offset pain with increased positive coping skills. Offered prayer at [facility] request.  Will continue to follow.  Patra Gherardi L. [name] HERO.Div

## 2024-02-18 NOTE — Group Note (Signed)
 Recreation Therapy Group Note   Group Topic:Other  Group Date: 02/18/2024 Start Time: 1400 End Time: 1445 Facilitators: Celestia Jeoffrey BRAVO, LRT, CTRS Location: Dayroom  Activity Description/Intervention: Therapeutic Drumming. Patients with peers and staff were given the opportunity to engage in a leader facilitated [facility] Group Empowerment Drumming Circle with staff from the [employer], in partnership with [employer]. Teaching laboratory technician and trained [employer], Norleen Mon leading with LRT observing and documenting intervention and pt response. This evidenced-based practice targets 7 areas of health and wellbeing in the human experience including: stress-reduction, exercise, self-expression, camaraderie/support, nurturing, spirituality, and music-making (leisure).    Goal Area(s) Addresses:  Patient will engage in pro-social way in music group.  Patient will follow directions of drum leader on the first prompt. Patient will demonstrate no behavioral issues during group.  Patient will identify if a reduction in stress level occurs as a result of participation in therapeutic drum circle.     Affect/Mood: N/A   Participation Level: Did not attend    Clinical Observations/Individualized Feedback: Patient did not attend group.   Plan: Continue to engage patient in RT group sessions 2-3x/week.   [address] LRT, CTRS 02/18/2024 4:18 PM

## 2024-02-18 NOTE — [facility] Group Notes (Signed)
 Spirituality Group   Group Goal: Support / Education around grief and loss    Group Description: Following introductions and group rules, group members engaged in facilitated group dialog and support around topic of loss, with particular support around experiences of loss in their lives. Group members identified types of loss (relationships / self / things) as well as patterns, circumstances, and changes that precipitate loss. Reflection invited on thoughts / feelings around loss, normalized grief responses, and recognized variety in grief experience. Group noted Worden's four tasks of grief in discussion. Group drew on Adlerian / Rogerian, narrative, MI, with Yalom's group therapy as a primary framework.   Observations: [name] was an active participant in the group discussion. She shared meaningfully around recent loss of partner, Sudie. This helped establish further sharing and build mutual empathy and trust among group.  [name] L. [name] HERO.Div

## 2024-02-18 NOTE — Progress Notes (Signed)
   02/18/24 2100  Psych Admission Type (Psych Patients Only)  Admission Status Involuntary  Psychosocial Assessment  Patient Complaints None  Eye Contact Fair  Facial Expression Animated  Affect Anxious  Speech Logical/coherent  Interaction Needy  Motor Activity Slow  Appearance/Hygiene Unremarkable  Behavior Characteristics Cooperative  Mood Pleasant  Thought Process  Coherency Circumstantial  Content WDL  Delusions None reported or observed  Perception WDL  Hallucination None reported or observed  Judgment Impaired  Confusion Mild  Danger to Self  Current suicidal ideation? Denies  Agreement Not to Harm Self Yes  Description of Agreement verbal  Danger to Others  Danger to Others None reported or observed

## 2024-02-18 NOTE — Group Note (Signed)
 Date:  02/18/2024 Time:  10:59 AM  Group Topic/Focus:  Music therapy with word search and conversation.    Participation Level:  Active  Participation Quality:  Appropriate  Affect:  Appropriate  Cognitive:  Appropriate  Insight: Appropriate  Engagement in Group:  Engaged  Modes of Intervention:  Activity  Additional Comments:  none  Norleen SHAUNNA Bias 02/18/2024, 10:59 AM

## 2024-02-18 NOTE — Progress Notes (Signed)
 At approx 11:50 patient complained of low sugar requesting a snack. Patients BS was 141.  She then requested her bp taken as she felt dizzy.  Bp noted to be 84/67 dr made aware and she came to speak with patient.  She was offered ensure and gatorade but patient states they upset her stomach.  Pedialyte obtained from ER and Boost breeze ordered by dietician. Recheck at 1240 showed bp had improved to 127/89.  Will continue to monitor.

## 2024-02-18 NOTE — Plan of Care (Signed)
  Problem: Education: Goal: Ability to state activities that reduce stress will improve Outcome: Progressing   Problem: Coping: Goal: Ability to identify and develop effective coping behavior will improve Outcome: Progressing   Problem: Self-Concept: Goal: Ability to identify factors that promote anxiety will improve Outcome: Progressing Goal: Level of anxiety will decrease Outcome: Progressing Goal: Ability to modify response to factors that promote anxiety will improve Outcome: Progressing   Problem: Education: Goal: Knowledge of [place] [facility] Education information/materials will improve Outcome: Progressing Goal: Emotional status will improve Outcome: Progressing Goal: Mental status will improve Outcome: Progressing Goal: Verbalization of understanding the information provided will improve Outcome: Progressing   Problem: Activity: Goal: Interest or engagement in activities will improve Outcome: Progressing Goal: Sleeping patterns will improve Outcome: Progressing   Problem: Coping: Goal: Ability to verbalize frustrations and anger appropriately will improve Outcome: Progressing Goal: Ability to demonstrate self-control will improve Outcome: Progressing   Problem: Health Behavior/Discharge Planning: Goal: Identification of resources available to assist in meeting health care needs will improve Outcome: Progressing Goal: Compliance with treatment plan for underlying cause of condition will improve Outcome: Progressing   Problem: Physical Regulation: Goal: Ability to maintain clinical measurements within normal limits will improve Outcome: Progressing   Problem: Safety: Goal: Periods of time without injury will increase Outcome: Progressing   

## 2024-02-18 NOTE — Progress Notes (Signed)
 [facility] MD Progress Note  02/18/2024 9:59 PM [name]  MRN:  [id number] [name] is a 58 y.o. AA female with a past psychiatric history of schizoaffective disorder, bipolar, schizophrenia, substance-induced mood disorder/psychosis, alcohol  use disorder severe, polysubstance abuse (I.e., though largely crack cocaine), and MDD, with pertinent medical comorbidities/history that includes COPD, atrial fibrillation, seizures, diabetes, and DDD, who presents this encounter by way of [employer] under involuntary commitment taken out by family for concerns for possible suicide attempt by way of overdose, who upon arrival and EDP evaluation, consulted psychiatry for specialty evaluation and recommendations. Patient is currently involuntarily committed at this time Patient is admitted to [facility] unit with Q15 min safety monitoring. Multidisciplinary team approach is offered. Medication management; group/milieu therapy is offered.   Subjective:  Chart reviewed, case discussed in multidisciplinary meeting, patient seen during rounds.  Interview patient shares her frustration about having low back pain.  She was offered lidocaine  patch yesterday and patient still reports residual pain.  Naproxen  twice daily was discussed to help with the pain.  Patient is also anxious to know when she is going to get Abilify  Maintena.  Patient then reports feeling nauseous and dizzy.  Blood pressure checked patient had lowered blood pressure off 84/57.  Patient was offered saltine crackers and some electrolyte drink.  Blood pressure improved.  Patient denies SI/HI/plan and denies hallucinations.  Sleep: Poor  Appetite:  Fair  Past Psychiatric History: see h&P Family History:  Family History  Problem Relation Age of Onset   Diabetes Mother    Heart disease Mother    Hypertension Mother    Heart attack Father        44s   Heart attack Sister        62   COPD Other    Breast cancer Neg Hx    Social History:  Social  History   Substance and Sexual Activity  Alcohol  Use Yes   Comment: 80 ounces daily     Social History   Substance and Sexual Activity  Drug Use Yes   Types: Cocaine, Marijuana, Crack cocaine   Comment: last use- 03/19/2023    Social History   Socioeconomic History   Marital status: Widowed    Spouse name: Not on file   Number of children: 3   Years of education: Not on file   Highest education level: GED or equivalent  Occupational History   Not on file  Tobacco Use   Smoking status: Every Day    Current packs/day: 1.00    Types: Cigarettes   Smokeless tobacco: Never  Vaping Use   Vaping status: Never Used  Substance and Sexual Activity   Alcohol  use: Yes    Comment: 80 ounces daily   Drug use: Yes    Types: Cocaine, Marijuana, Crack cocaine    Comment: last use- 03/19/2023   Sexual activity: Not Currently    Partners: Male  Other Topics Concern   Not on file  Social History Narrative   Not on file   Social Drivers of Health   Financial Resource Strain: Low Risk  (02/05/2024)   Overall Financial Resource Strain (CARDIA)    Difficulty of Paying Living Expenses: Not hard at all  Food Insecurity: No Food Insecurity (02/14/2024)   Hunger Vital Sign    Worried About Running Out of Food in the Last Year: Never true    Ran Out of Food in the Last Year: Never true  Transportation Needs: No Transportation Needs (  02/14/2024)   PRAPARE - Administrator, Civil Service (Medical): No    Lack of Transportation (Non-Medical): No  Physical Activity: Inactive (02/05/2024)   Exercise Vital Sign    Days of Exercise per Week: 0 days    Minutes of Exercise per Session: Not on file  Stress: Stress Concern Present (02/05/2024)   [employer] of Occupational Health - Occupational Stress Questionnaire    Feeling of Stress: Very much  Social Connections: Socially Isolated (02/05/2024)   Social Connection and Isolation Panel    Frequency of Communication with Friends  and Family: More than three times a week    Frequency of Social Gatherings with Friends and Family: More than three times a week    Attends Religious Services: Never    Database administrator or Organizations: No    Attends Banker Meetings: Not on file    Marital Status: Widowed   Past Medical History:  Past Medical History:  Diagnosis Date   Abdominal pain    Accidental drug overdose April 2013   Anxiety    Atrial fibrillation (HCC) 09/29/11   converted spontaneously   Chronic back pain    Chronic knee pain    Chronic nausea    Chronic pain    COPD (chronic obstructive pulmonary disease) (HCC)    Depression    Diabetes mellitus    states her doctor took her off all DM meds in past month   Diabetic neuropathy (HCC)    Dyspnea    with exertion    GERD (gastroesophageal reflux disease)    Headache(784.0)    migraines    HTN (hypertension)    not on meds since in a year    Hyperlipidemia    Hypothyroidism    not on meds in a while    Mental disorder    Bipolar and schizophrenic   Nausea and vomiting 01/02/2023   Requires supplemental oxygen     as needed per patient    Schizophrenia (HCC)    Schizophrenia, acute (HCC) 11/13/2017   Tobacco abuse     Past Surgical History:  Procedure Laterality Date   ABDOMINAL HYSTERECTOMY     BLADDER SUSPENSION  03/04/2011   Procedure: [facility] PROCEDURE;  Surgeon: Glendia LABOR MacDiarmid;  Location: [facility];  Service: Urology;  Laterality: N/A;   BOWEL RESECTION N/A 04/18/2022   Procedure: SMALL BOWEL RESECTION;  Surgeon: Vanderbilt Ned, MD;  Location: [facility] OR;  Service: General;  Laterality: N/A;   CYSTOCELE REPAIR  03/04/2011   Procedure: ANTERIOR REPAIR (CYSTOCELE);  Surgeon: Glendia LABOR Elizabeth;  Location: [facility];  Service: Urology;  Laterality: N/A;   CYSTOSCOPY  03/04/2011   Procedure: CYSTOSCOPY;  Surgeon: Glendia LABOR MacDiarmid;  Location: [facility];  Service: Urology;  Laterality: N/A;   ESOPHAGOGASTRODUODENOSCOPY (EGD) WITH PROPOFOL   N/A 05/12/2017   Procedure: ESOPHAGOGASTRODUODENOSCOPY (EGD) WITH PROPOFOL ;  Surgeon: Ethyl Lenis, MD;  Location: [facility];  Service: General;  Laterality: N/A;   GASTRIC ROUX-EN-Y N/A 03/25/2016   Procedure: LAPAROSCOPIC ROUX-EN-Y GASTRIC BYPASS WITH UPPER ENDOSCOPY;  Surgeon: Morene Olives, MD;  Location: [facility];  Service: General;  Laterality: N/A;   KNEE SURGERY     LAPAROSCOPIC ASSISTED VAGINAL HYSTERECTOMY  03/04/2011   Procedure: LAPAROSCOPIC ASSISTED VAGINAL HYSTERECTOMY;  Surgeon: Rosaline LITTIE Cobble, MD;  Location: [facility];  Service: Gynecology;  Laterality: N/A;   LAPAROTOMY N/A 04/18/2022   Procedure: EXPLORATORY LAPAROTOMY;  Surgeon: Vanderbilt Ned, MD;  Location: [facility];  Service: General;  Laterality: N/A;   LAPAROTOMY N/A 04/24/2022   Procedure: BRING BACK EXPLORATORY LAPAROTOMY;  Surgeon: Sebastian Moles, MD;  Location: [facility] OR;  Service: General;  Laterality: N/A;   TOTAL HIP ARTHROPLASTY Right 08/27/2022   Procedure: TOTAL HIP ARTHROPLASTY;  Surgeon: Edna Toribio LABOR, MD;  Location: [facility];  Service: Orthopedics;  Laterality: Right;    Current Medications: Current Facility-Administered Medications  Medication Dose Route Frequency Provider Last Rate Last Admin   albuterol  (VENTOLIN  HFA) 108 (90 Base) MCG/ACT inhaler 2 puff  2 puff Inhalation Q6H PRN Bobbitt, Shalon E, NP   2 puff at 02/18/24 2114   ARIPiprazole  (ABILIFY ) tablet 5 mg  5 mg Oral Daily Jashaun Penrose, MD   5 mg at 02/18/24 9077   haloperidol  (HALDOL ) tablet 5 mg  5 mg Oral TID PRN Butler, Laura N, MD   5 mg at 02/15/24 1131   And   diphenhydrAMINE  (BENADRYL ) capsule 50 mg  50 mg Oral TID PRN Butler, Laura N, MD   50 mg at 02/18/24 0144   haloperidol  lactate (HALDOL ) injection 5 mg  5 mg Intramuscular TID PRN Butler, Laura N, MD   5 mg at 02/15/24 1609   And   diphenhydrAMINE  (BENADRYL ) injection 50 mg  50 mg Intramuscular TID PRN Butler, Laura N, MD   50 mg at 02/15/24 1608   And   LORazepam  (ATIVAN )  injection 2 mg  2 mg Intramuscular TID PRN Butler, Laura N, MD   2 mg at 02/15/24 1609   divalproex  (DEPAKOTE  ER) 24 hr tablet 500 mg  500 mg Oral Daily Ettie Krontz, MD   500 mg at 02/18/24 9077   feeding supplement (BOOST / RESOURCE BREEZE) liquid 1 Container  1 Container Oral TID BM Jaiveon Suppes, MD   1 Container at 02/18/24 1402   ibuprofen  (ADVIL ) tablet 400 mg  400 mg Oral Q6H PRN Bobbitt, Shalon E, NP   400 mg at 02/18/24 1603   ketorolac  (TORADOL ) injection 30 mg  30 mg Intramuscular Q6H PRN Shrivastava, Aryendra, MD   30 mg at 02/18/24 2152   lidocaine  (LIDODERM ) 5 % 1 patch  1 patch Transdermal Q24H Emalie Mcwethy, MD   1 patch at 02/18/24 1038   nicotine  (NICODERM CQ  - dosed in mg/24 hours) patch 21 mg  21 mg Transdermal Daily Liberty Seto, MD   21 mg at 02/18/24 9075   nicotine  polacrilex (NICORETTE ) gum 2 mg  2 mg Oral PRN Charlsie Fleeger, MD   2 mg at 02/17/24 1011   ondansetron  (ZOFRAN -ODT) disintegrating tablet 4 mg  4 mg Oral Q6H PRN McLauchlin, Angela, NP   4 mg at 02/18/24 1737   thiamine  (VITAMIN B1) injection 100 mg  100 mg Intramuscular Once Shrivastava, Aryendra, MD       thiamine  (VITAMIN B1) tablet 100 mg  100 mg Oral Daily Shrivastava, Aryendra, MD   100 mg at 02/18/24 9077   traZODone  (DESYREL ) tablet 50 mg  50 mg Oral QHS Kieren Adkison, MD   50 mg at 02/18/24 2114    Lab Results:  Results for orders placed or performed during the hospital encounter of 02/14/24 (from the past 48 hours)  Glucose, capillary     Status: Abnormal   Collection Time: 02/18/24  1:11 PM  Result Value Ref Range   Glucose-Capillary 141 (H) 70 - 99 mg/dL    Comment: Glucose reference range applies only to samples taken after fasting for at least 8 hours.     Blood Alcohol  level:  Lab Results  Component Value Date   ETH <15 02/12/2024   ETH 132 (H) 12/01/2023    Metabolic Disorder Labs: Lab Results  Component Value Date   HGBA1C 7.4 (H) 02/05/2024   MPG 166 02/05/2024    MPG 139.85 11/05/2023   Lab Results  Component Value Date   PROLACTIN 32.8 (H) 11/15/2017   Lab Results  Component Value Date   CHOL 184 02/05/2024   TRIG 70 02/05/2024   HDL 110 02/05/2024   CHOLHDL 1.7 02/05/2024   VLDL 8 11/05/2023   LDLCALC 59 02/05/2024   LDLCALC 45 11/05/2023    Physical Findings: AIMS:  , ,  ,  ,    CIWA:  CIWA-Ar Total: 3 COWS:      Psychiatric Specialty Exam:  Presentation  General Appearance:  Appropriate for Environment; Casual  Eye Contact: Fair  Speech: Normal Rate  Speech Volume: Normal    Mood and Affect  Mood: Euthymic  Affect: Appropriate   Thought Process  Thought Processes: Linear  Descriptions of Associations:Intact  Orientation:Full (Time, Place and Person)  Thought Content:Logical  Hallucinations:Hallucinations: None   Ideas of Reference:None  Suicidal Thoughts:Suicidal Thoughts: No   Homicidal Thoughts:Homicidal Thoughts: No    Sensorium  Memory: Immediate Fair; Recent Fair; Remote Fair  Judgment: Impaired  Insight: Shallow   Executive Functions  Concentration: Fair  Attention Span: Fair  Recall: [employer] of Knowledge: Fair  Language: Fair   Psychomotor Activity  Psychomotor Activity: Psychomotor Activity: Normal   Musculoskeletal: Strength & Muscle Tone: within normal limits Gait & Station: normal Assets  Assets: Manufacturing systems engineer; Desire for Improvement; Physical Health    Physical Exam: Physical Exam Vitals and nursing note reviewed.    ROS Blood pressure 136/74, pulse 87, temperature 98.4 F (36.9 C), resp. rate 18, height 5' 1 (1.549 m), weight 56.7 kg, last menstrual period 01/08/2011, SpO2 95%. Body mass index is 23.62 kg/m.  Diagnosis: Principal Problem:   Borderline personality disorder (HCC)  Principal Diagnosis: Borderline personality disorder (HCC) Diagnosis:  Principal Problem:   Borderline personality disorder (HCC)  Alcohol  use  disorder severe R/O Bipolar I disorder   Clinical Decision Making: Patient with history of depression, severe alcohol  use disorder, previous history of aggressive behaviors admitted to geropsych unit to be monitored s/p alleged overdose by family and aggressive behaviors by the PD that arrived on the scene at home.  Patient is displaying aggressive and impulsive behaviors on the unit receiving multiple as needed medications.  Patient will be observed closely   Treatment Plan Summary:   Safety and Monitoring:             -- Voluntary admission to inpatient psychiatric unit for safety, stabilization and treatment             -- Daily contact with patient to assess and evaluate symptoms and progress in treatment             -- Patient's case to be discussed in multi-disciplinary team meeting             -- Observation Level: q15 minute checks             -- Vital signs:  q12 hours             -- Precautions: suicide, elopement, and assault   2. Psychiatric Diagnoses and Treatment:                Abilify  30mg  for mood stabilization= transitioned to Abilify  maintena  400 mg Q4 weeks  Depakote  ER 500 mg for mood stabilization and to prevent any withdrawal seizures.    -- The risks/benefits/side-effects/alternatives to this medication were discussed in detail with the patient and time was given for questions. The patient consents to medication trial.                -- Metabolic profile and EKG monitoring obtained while on an atypical antipsychotic (BMI: Lipid Panel: HbgA1c: QTc:)              -- Encouraged patient to participate in unit milieu and in scheduled group therapies                            3. Medical Issues Being Addressed:    CIWA protocol and Ativan  taper  4. Discharge Planning:   -- Social work and case management to assist with discharge planning and identification of hospital follow-up needs prior to discharge  -- Estimated LOS: 3-4 days  Allyn Foil, MD 02/18/2024,  9:59 PM

## 2024-02-19 ENCOUNTER — Ambulatory Visit: Admitting: Adult Health

## 2024-02-19 DIAGNOSIS — F603 Borderline personality disorder: Secondary | ICD-10-CM | POA: Diagnosis not present

## 2024-02-19 MED ORDER — TRAZODONE HCL 50 MG PO TABS: 50.0000 mg | ORAL_TABLET | Freq: Every day | ORAL | 0 refills | Status: DC

## 2024-02-19 MED ORDER — DIVALPROEX SODIUM ER 500 MG PO TB24: 500.0000 mg | ORAL_TABLET | Freq: Every day | ORAL | 0 refills | Status: DC

## 2024-02-19 MED ORDER — LIDOCAINE 5 % EX PTCH: 1.0000 | MEDICATED_PATCH | CUTANEOUS | 0 refills | Status: DC

## 2024-02-19 MED ORDER — ARIPIPRAZOLE ER 400 MG IM PRSY: 400.0000 mg | PREFILLED_SYRINGE | INTRAMUSCULAR | 0 refills | Status: DC

## 2024-02-19 MED ORDER — ALBUTEROL SULFATE HFA 108 (90 BASE) MCG/ACT IN AERS: 2.0000 | INHALATION_SPRAY | Freq: Four times a day (QID) | RESPIRATORY_TRACT | 0 refills | Status: DC | PRN

## 2024-02-19 NOTE — Group Note (Signed)
 LCSW Group Therapy Note  Group Date: 02/18/2024 Start Time: 1300 End Time: 1345   Type of Therapy and Topic:  Group Therapy - Healthy vs Unhealthy Coping Skills  Participation Level:  Did Not Attend   Description of Group The focus of this group was to determine what unhealthy coping techniques typically are used by group members and what healthy coping techniques would be helpful in coping with various problems. Patients were guided in becoming aware of the differences between healthy and unhealthy coping techniques. Patients were asked to identify 2-3 healthy coping skills they would like to learn to use more effectively.  Therapeutic Goals Patients learned that coping is what human beings do all day long to deal with various situations in their lives Patients defined and discussed healthy vs unhealthy coping techniques Patients identified their preferred coping techniques and identified whether these were healthy or unhealthy Patients determined 2-3 healthy coping skills they would like to become more familiar with and use more often. Patients provided support and ideas to each other   Summary of Patient Progress:  X   Therapeutic Modalities Cognitive Behavioral Therapy Motivational Interviewing  Lum [name], [place] 02/19/2024  9:40 AM

## 2024-02-19 NOTE — Group Note (Signed)
 Date:  02/19/2024 Time:  11:59 AM  Group Topic/Focus:  Boundaries, Self Love, Deep Breathing This group focused on promoting emotional well-being through discussion and role play. Patients explored the importance of healthy personal boundaries in maintaining safety and self-respect and identifying examples of boundary-setting in daily life.   Patients were able to identify the importance of needs based off of Maslow's Hierarchy pyramid and were guided in conversations regarding self-worth and self-love, emphasizing self-care and affirming one's own needs without guilt.  The group concluded with a deep-breathing exercise to reinforce self-regulation and relaxation skills.  Participation Level:  Did Not Attend  Participation Quality:  N/A  Affect:  N/A  Cognitive:  N/A  Insight: None  Engagement in Group:  N/A  Modes of Intervention:  N/A  Additional Comments:  I personally encouraged pt to attend this group but she declined.  Kristi HERO Miliana Gangwer 02/19/2024, 11:59 AM

## 2024-02-19 NOTE — Progress Notes (Signed)
  [facility] Adult Case Management Discharge Plan :  Will you be returning to the same living situation after discharge:  Yes,  pt will return home  At discharge, do you have transportation home?: Yes,  CSW will assist with transportation  Do you have the ability to pay for your medications: Yes,  [employer] MEDICARE / DREMA DUAL COMPLETE  Release of information consent forms completed and in the chart;  Patient's signature needed at discharge.  Patient to Follow up at:  Follow-up Information     [facility] Go on 04/18/2024.   Why: You appointment is scheduled for 04/18/24 at 9:00 AM Contact information: [address] [place] [address] [phone]                 Next level of care provider has access to [facility] Link:no  Safety Planning and Suicide Prevention discussed: Yes,  [name] Robertson, POA, daughter      Has patient been referred to the Quitline?: Patient refused referral for treatment  Patient has been referred for addiction treatment: Patient refused referral for treatment.  [address], LCSWA 02/19/2024, 11:09 AM

## 2024-02-19 NOTE — Discharge Summary (Signed)
 Physician Discharge Summary Note  Patient:  [name] is an 58 y.o., female MRN:  [id number] DOB:  Dec 04, 1965 Patient phone:  [phone] (home)  Patient address:   [address] [place] [address],   Total time spent: 40 min Date of Admission:  02/14/2024 Date of Discharge: 02/19/24  Reason for Admission:  [name] is a 58 y.o. AA female with a past psychiatric history of schizoaffective disorder, bipolar, schizophrenia, substance-induced mood disorder/psychosis, alcohol  use disorder severe, polysubstance abuse (I.e., though largely crack cocaine), and MDD, with pertinent medical comorbidities/history that includes COPD, atrial fibrillation, seizures, diabetes, and DDD, who presents this encounter by way of [employer] under involuntary commitment taken out by family for concerns for possible suicide attempt by way of overdose, who upon arrival and EDP evaluation, consulted psychiatry for specialty evaluation and recommendations. Patient is currently involuntarily committed at this time Patient is admitted to [facility] unit with Q15 min safety monitoring. Multidisciplinary team approach is offered. Medication management; group/milieu therapy is offered.   Principal Problem: Borderline personality disorder [facility]) Discharge Diagnoses: Principal Problem:   Borderline personality disorder (HCC)   Past Psychiatric History: see h&p  Family Psychiatric  History: see h&p Social History:  Social History   Substance and Sexual Activity  Alcohol  Use Yes   Comment: 80 ounces daily     Social History   Substance and Sexual Activity  Drug Use Yes   Types: Cocaine, Marijuana, Crack cocaine   Comment: last use- 03/19/2023    Social History   Socioeconomic History   Marital status: Widowed    Spouse name: Not on file   Number of children: 3   Years of education: Not on file   Highest education level: GED or equivalent  Occupational History   Not on file  Tobacco Use    Smoking status: Every Day    Current packs/day: 1.00    Types: Cigarettes   Smokeless tobacco: Never  Vaping Use   Vaping status: Never Used  Substance and Sexual Activity   Alcohol  use: Yes    Comment: 80 ounces daily   Drug use: Yes    Types: Cocaine, Marijuana, Crack cocaine    Comment: last use- 03/19/2023   Sexual activity: Not Currently    Partners: Male  Other Topics Concern   Not on file  Social History Narrative   Not on file   Social Drivers of Health   Financial Resource Strain: Low Risk  (02/05/2024)   Overall Financial Resource Strain (CARDIA)    Difficulty of Paying Living Expenses: Not hard at all  Food Insecurity: No Food Insecurity (02/14/2024)   Hunger Vital Sign    Worried About Running Out of Food in the Last Year: Never true    Ran Out of Food in the Last Year: Never true  Transportation Needs: No Transportation Needs (02/14/2024)   PRAPARE - Administrator, Civil Service (Medical): No    Lack of Transportation (Non-Medical): No  Physical Activity: Inactive (02/05/2024)   Exercise Vital Sign    Days of Exercise per Week: 0 days    Minutes of Exercise per Session: Not on file  Stress: Stress Concern Present (02/05/2024)   [employer] of Occupational Health - Occupational Stress Questionnaire    Feeling of Stress: Very much  Social Connections: Socially Isolated (02/05/2024)   Social Connection and Isolation Panel    Frequency of Communication with Friends and Family: More than three times a week  Frequency of Social Gatherings with Friends and Family: More than three times a week    Attends Religious Services: Never    Database administrator or Organizations: No    Attends Banker Meetings: Not on file    Marital Status: Widowed   Past Medical History:  Past Medical History:  Diagnosis Date   Abdominal pain    Accidental drug overdose April 2013   Anxiety    Atrial fibrillation (HCC) 09/29/11   converted spontaneously    Chronic back pain    Chronic knee pain    Chronic nausea    Chronic pain    COPD (chronic obstructive pulmonary disease) (HCC)    Depression    Diabetes mellitus    states her doctor took her off all DM meds in past month   Diabetic neuropathy (HCC)    Dyspnea    with exertion    GERD (gastroesophageal reflux disease)    Headache(784.0)    migraines    HTN (hypertension)    not on meds since in a year    Hyperlipidemia    Hypothyroidism    not on meds in a while    Mental disorder    Bipolar and schizophrenic   Nausea and vomiting 01/02/2023   Requires supplemental oxygen     as needed per patient    Schizophrenia (HCC)    Schizophrenia, acute (HCC) 11/13/2017   Tobacco abuse     Past Surgical History:  Procedure Laterality Date   ABDOMINAL HYSTERECTOMY     BLADDER SUSPENSION  03/04/2011   Procedure: [facility] PROCEDURE;  Surgeon: Glendia LABOR MacDiarmid;  Location: [facility];  Service: Urology;  Laterality: N/A;   BOWEL RESECTION N/A 04/18/2022   Procedure: SMALL BOWEL RESECTION;  Surgeon: Vanderbilt Ned, MD;  Location: [facility] OR;  Service: General;  Laterality: N/A;   CYSTOCELE REPAIR  03/04/2011   Procedure: ANTERIOR REPAIR (CYSTOCELE);  Surgeon: Glendia LABOR Elizabeth;  Location: [facility];  Service: Urology;  Laterality: N/A;   CYSTOSCOPY  03/04/2011   Procedure: CYSTOSCOPY;  Surgeon: Glendia LABOR MacDiarmid;  Location: [facility];  Service: Urology;  Laterality: N/A;   ESOPHAGOGASTRODUODENOSCOPY (EGD) WITH PROPOFOL  N/A 05/12/2017   Procedure: ESOPHAGOGASTRODUODENOSCOPY (EGD) WITH PROPOFOL ;  Surgeon: Ethyl Lenis, MD;  Location: [facility];  Service: General;  Laterality: N/A;   GASTRIC ROUX-EN-Y N/A 03/25/2016   Procedure: LAPAROSCOPIC ROUX-EN-Y GASTRIC BYPASS WITH UPPER ENDOSCOPY;  Surgeon: Morene Olives, MD;  Location: [facility];  Service: General;  Laterality: N/A;   KNEE SURGERY     LAPAROSCOPIC ASSISTED VAGINAL HYSTERECTOMY  03/04/2011   Procedure: LAPAROSCOPIC ASSISTED VAGINAL HYSTERECTOMY;   Surgeon: Rosaline LITTIE Cobble, MD;  Location: [facility];  Service: Gynecology;  Laterality: N/A;   LAPAROTOMY N/A 04/18/2022   Procedure: EXPLORATORY LAPAROTOMY;  Surgeon: Vanderbilt Ned, MD;  Location: [facility];  Service: General;  Laterality: N/A;   LAPAROTOMY N/A 04/24/2022   Procedure: BRING BACK EXPLORATORY LAPAROTOMY;  Surgeon: Sebastian Moles, MD;  Location: [facility] OR;  Service: General;  Laterality: N/A;   TOTAL HIP ARTHROPLASTY Right 08/27/2022   Procedure: TOTAL HIP ARTHROPLASTY;  Surgeon: Edna Toribio LABOR, MD;  Location: [facility];  Service: Orthopedics;  Laterality: Right;   Family History:  Family History  Problem Relation Age of Onset   Diabetes Mother    Heart disease Mother    Hypertension Mother    Heart attack Father        4s   Heart attack Sister  48   COPD Other    Breast cancer Neg Hx     Hospital Course:  RAINIE CRENSHAW is a 58 y.o. AA female with a past psychiatric history of schizoaffective disorder, bipolar, schizophrenia, substance-induced mood disorder/psychosis, alcohol  use disorder severe, polysubstance abuse (I.e., though largely crack cocaine), and MDD, with pertinent medical comorbidities/history that includes COPD, atrial fibrillation, seizures, diabetes, and DDD, who presents this encounter by way of [employer] under involuntary commitment taken out by family for concerns for possible suicide attempt by way of overdose, who upon arrival and EDP evaluation, consulted psychiatry for specialty evaluation and recommendations. Patient is currently involuntarily committed at this time Patient is admitted to [facility] unit with Q15 min safety monitoring. Multidisciplinary team approach is offered. Medication management; group/milieu therapy is offered.  Detailed risk assessment is complete based on clinical exam and individual risk factors and acute suicide risk is low and acute violence risk is low.   On admission patient was noted to be having alcohol  withdrawal.  She was  started on CIWA protocol and Ativan  taper.  Patient received few doses of Ativan  and later on she declined Ativan .  She was started on nicotine  patch and gum as she was going through nicotine  withdrawal.  Patient displayed significant mood lability on admission.  She was started on Abilify  5 mg and Depakote  ER 500 mg for mood stabilization.  Patient displayed drastic improvement in her mood with the medication regimen.  Provided discussed about Abilify  maintainer to increase compliance with mood stabilizer.  Patient agreed with the plan.  Patient was given Abilify  30 mg day before the LAI.  Patient received Abilify  Maintena 400 mg LAI 02/18/2024 and tolerated it very well.  On the day of discharge patient consistently denies SI/HI/plan and denies auditory/visual hallucinations.  Currently, all modifiable risk of harm to self/harm to others have been addressed and patient is no longer appropriate for the acute inpatient setting and is able to continue treatment for mental health needs in the community with the supports as indicated below.  Patient is educated and verbalized understanding of discharge plan of care including medications, follow-up appointments, mental health resources and further crisis services in the community.  He is instructed to call 911 or present to the nearest emergency room should he experience any decompensation in mood, disturbance of bowel or return of suicidal/homicidal ideations.  Patient verbalizes understanding of this education and agrees to this plan of care  Physical Findings: AIMS:  , ,  ,  ,    CIWA:  CIWA-Ar Total: 3 COWS:        Psychiatric Specialty Exam:  Presentation  General Appearance:  Appropriate for Environment; Casual  Eye Contact: Fair  Speech: Clear and Coherent  Speech Volume: Normal    Mood and Affect  Mood: Euthymic  Affect: Appropriate   Thought Process  Thought Processes: Coherent  Descriptions of  Associations:Intact  Orientation:Full (Time, Place and Person)  Thought Content:Logical  Hallucinations:Hallucinations: None  Ideas of Reference:None  Suicidal Thoughts:Suicidal Thoughts: No  Homicidal Thoughts:Homicidal Thoughts: No   Sensorium  Memory: Immediate Fair; Recent Fair; Remote Fair  Judgment: Fair  Insight: Fair   Art therapist  Concentration: Fair  Attention Span: Fair  Recall: [employer] of Knowledge: Fair  Language: Fair   Psychomotor Activity  Psychomotor Activity: Psychomotor Activity: Normal  Musculoskeletal: Strength & Muscle Tone: within normal limits Gait & Station: normal Assets  Assets: Manufacturing systems engineer; Desire for Improvement   Sleep  Sleep: Sleep:  Fair    Physical Exam: Physical Exam ROS Blood pressure 125/88, pulse 90, temperature 97.7 F (36.5 C), resp. rate 18, height 5' 1 (1.549 m), weight 56.7 kg, last menstrual period 01/08/2011, SpO2 100%. Body mass index is 23.62 kg/m.   Social History   Tobacco Use  Smoking Status Every Day   Current packs/day: 1.00   Types: Cigarettes  Smokeless Tobacco Never   Tobacco Cessation:  A prescription for an FDA-approved tobacco cessation medication was offered at discharge and the patient refused   Blood Alcohol  level:  Lab Results  Component Value Date   [facility] <15 02/12/2024   ETH 132 (H) 12/01/2023    Metabolic Disorder Labs:  Lab Results  Component Value Date   HGBA1C 7.4 (H) 02/05/2024   MPG 166 02/05/2024   MPG 139.85 11/05/2023   Lab Results  Component Value Date   PROLACTIN 32.8 (H) 11/15/2017   Lab Results  Component Value Date   CHOL 184 02/05/2024   TRIG 70 02/05/2024   HDL 110 02/05/2024   CHOLHDL 1.7 02/05/2024   VLDL 8 11/05/2023   LDLCALC 59 02/05/2024   LDLCALC 45 11/05/2023    See Psychiatric Specialty Exam and Suicide Risk Assessment completed by Attending Physician prior to discharge.  Discharge destination:   Home  Is patient on multiple antipsychotic therapies at discharge:  No   Has Patient had three or more failed trials of antipsychotic monotherapy by history:  No  Recommended Plan for Multiple Antipsychotic Therapies: NA   Allergies as of 02/19/2024       Reactions   Iron  Dextran Shortness Of Breath, Anxiety   Aspirin  Nausea And Vomiting, Other (See Comments)   Ok to take tylenol  or ibuprofen    Penicillins Other (See Comments)   Unknown reaction from childhood: family would like this to remain as an allergy. Tolerated cephalosporins and zosyn         Medication List     STOP taking these medications    chlordiazePOXIDE  25 MG capsule Commonly known as: LIBRIUM    hydrOXYzine  25 MG tablet Commonly known as: ATARAX    loperamide  2 MG capsule Commonly known as: IMODIUM    magnesium  hydroxide 400 MG/5ML suspension Commonly known as: MILK OF MAGNESIA   nicotine  21 mg/24hr patch Commonly known as: NICODERM CQ  - dosed in mg/24 hours   nicotine  polacrilex 2 MG gum Commonly known as: NICORETTE    OLANZapine  injection Commonly known as: ZYPREXA    OLANZapine  zydis 5 MG disintegrating tablet Commonly known as: ZYPREXA    ondansetron  4 MG disintegrating tablet Commonly known as: ZOFRAN -ODT       TAKE these medications      Indication  albuterol  108 (90 Base) MCG/ACT inhaler Commonly known as: VENTOLIN  HFA Inhale 2 puffs into the lungs every 6 (six) hours as needed for wheezing or shortness of breath.    ARIPiprazole  5 MG tablet Commonly known as: ABILIFY  Take 1 tablet (5 mg total) by mouth daily. What changed: Another medication with the same name was added. Make sure you understand how and when to take each.  Indication: MIXED BIPOLAR AFFECTIVE DISORDER   ARIPiprazole  ER 400 MG Prsy prefilled syringe Commonly known as: ABILIFY  MAINTENA Inject 400 mg into the muscle every 28 (twenty-eight) days. What changed: You were already taking a medication with the same name,  and this prescription was added. Make sure you understand how and when to take each.    divalproex  500 MG 24 hr tablet Commonly known as: DEPAKOTE  ER Take 1 tablet (  500 mg total) by mouth daily.    lidocaine  5 % Commonly known as: LIDODERM  Place 1 patch onto the skin daily. Remove & Discard patch within 12 hours or as directed by MD    metFORMIN  750 MG 24 hr tablet Commonly known as: GLUCOPHAGE -XR Take 1 tablet (750 mg total) by mouth daily with breakfast.  Indication: Type 2 Diabetes   thiamine  100 MG tablet Commonly known as: Vitamin B-1 Take 1 tablet (100 mg total) by mouth daily.  Indication: alcohol  use   traZODone  50 MG tablet Commonly known as: DESYREL  Take 1 tablet (50 mg total) by mouth at bedtime.          Follow-up recommendations:  Activity:  As tolerated    Signed: Rakiya Krawczyk, MD 02/19/2024, 9:34 AM

## 2024-02-19 NOTE — Plan of Care (Signed)
 Patient alert and oriented x 4.  Affect is pleasant and calm. Denies anxiety, SI/HI or AVH.  Patient states they will try to keep themselves safe when they return home.  Reviewed discharge instructions with patient including follow up appointment with provider, medication and prescriptions.  Questions answered and understanding verbalized.  Discharge packet given.  All belongings returned to patient after verification completed by staff.    Patient escorted by staff off unit at this time stable without complaint.    Problem: Education: Goal: Ability to state activities that reduce stress will improve Outcome: Adequate for Discharge   Problem: Coping: Goal: Ability to identify and develop effective coping behavior will improve Outcome: Adequate for Discharge   Problem: Self-Concept: Goal: Ability to identify factors that promote anxiety will improve Outcome: Adequate for Discharge Goal: Level of anxiety will decrease Outcome: Adequate for Discharge Goal: Ability to modify response to factors that promote anxiety will improve Outcome: Adequate for Discharge   Problem: Education: Goal: Knowledge of [facility] Education information/materials will improve Outcome: Adequate for Discharge Goal: Emotional status will improve Outcome: Adequate for Discharge Goal: Mental status will improve Outcome: Adequate for Discharge Goal: Verbalization of understanding the information provided will improve Outcome: Adequate for Discharge   Problem: Activity: Goal: Interest or engagement in activities will improve Outcome: Adequate for Discharge Goal: Sleeping patterns will improve Outcome: Adequate for Discharge   Problem: Coping: Goal: Ability to verbalize frustrations and anger appropriately will improve Outcome: Adequate for Discharge Goal: Ability to demonstrate self-control will improve Outcome: Adequate for Discharge   Problem: Health Behavior/Discharge Planning: Goal:  Identification of resources available to assist in meeting health care needs will improve Outcome: Adequate for Discharge Goal: Compliance with treatment plan for underlying cause of condition will improve Outcome: Adequate for Discharge   Problem: Physical Regulation: Goal: Ability to maintain clinical measurements within normal limits will improve Outcome: Adequate for Discharge   Problem: Safety: Goal: Periods of time without injury will increase Outcome: Adequate for Discharge

## 2024-02-19 NOTE — Care Management Important Message (Signed)
 Important Message  Patient Details  Name: [name] MRN: [id number] Date of Birth: December 31, 1965   Important Message Given:  Yes - Medicare IM     Lum [name], LCSWA 02/19/2024, 11:15 AM

## 2024-02-19 NOTE — [facility] Suicide Risk Assessment (Signed)
 [facility] INPATIENT:  Family/Significant Other Suicide Prevention Education  Suicide Prevention Education:  Education Completed; [name] Robertson, daughter/ POA, [phone] ,  (name of family member/significant other) has been identified by the patient as the family member/significant other with whom the patient will be residing, and identified as the person(s) who will aid the patient in the event of a mental health crisis (suicidal ideations/suicide attempt).  With written consent from the patient, the family member/significant other has been provided the following suicide prevention education, prior to the and/or following the discharge of the patient.  The suicide prevention education provided includes the following: Suicide risk factors Suicide prevention and interventions National Suicide Hotline telephone number [facility] assessment telephone number [facility] Emergency Assistance 911 [facility] and/or Residential Mobile Crisis Unit telephone number  Request made of family/significant other to: Remove weapons (e.g., guns, rifles, knives), all items previously/currently identified as safety concern.   Remove drugs/medications (over-the-counter, prescriptions, illicit drugs), all items previously/currently identified as a safety concern.  The family member/significant other verbalizes understanding of the suicide prevention education information provided.  The family member/significant other agrees to remove the items of safety concern listed above.  [name] 02/19/2024, 11:12 AM

## 2024-02-19 NOTE — [facility] Suicide Risk Assessment (Signed)
 [facility] Discharge Suicide Risk Assessment   Principal Problem: Borderline personality disorder [facility]) Discharge Diagnoses: Principal Problem:   Borderline personality disorder (HCC)   Total Time spent with patient: 30 minutes  Musculoskeletal: Strength & Muscle Tone: within normal limits Gait & Station: normal Patient leans: Right  Psychiatric Specialty Exam  Presentation  General Appearance:  Appropriate for Environment; Casual  Eye Contact: Fair  Speech: Clear and Coherent  Speech Volume: Normal  Handedness: Right   Mood and Affect  Mood: Euthymic  Duration of Depression Symptoms: No data recorded Affect: Appropriate   Thought Process  Thought Processes: Coherent  Descriptions of Associations:Intact  Orientation:Full (Time, Place and Person)  Thought Content:Logical  History of Schizophrenia/Schizoaffective disorder:Yes  Duration of Psychotic Symptoms:Greater than six months  Hallucinations:Hallucinations: None  Ideas of Reference:None  Suicidal Thoughts:Suicidal Thoughts: No  Homicidal Thoughts:Homicidal Thoughts: No   Sensorium  Memory: Immediate Fair; Recent Fair; Remote Fair  Judgment: Fair  Insight: Fair   Art therapist  Concentration: Fair  Attention Span: Fair  Recall: [employer] of Knowledge: Fair  Language: Fair   Psychomotor Activity  Psychomotor Activity: Psychomotor Activity: Normal   Assets  Assets: Communication Skills; Desire for Improvement   Sleep  Sleep: Sleep: Fair  Estimated Sleeping Duration (Last 24 Hours): 5.75-6.50 hours  Physical Exam: Physical Exam ROS Blood pressure 125/88, pulse 90, temperature 97.7 F (36.5 C), resp. rate 18, height 5' 1 (1.549 m), weight 56.7 kg, last menstrual period 01/08/2011, SpO2 100%. Body mass index is 23.62 kg/m.  Mental Status Per Nursing Assessment::   On Admission:  NA  Demographic Factors:  Low socioeconomic status  Loss  Factors: Decrease in vocational status  Historical Factors: Impulsivity  Risk Reduction Factors:   Living with another person, especially a relative, Positive social support, Positive therapeutic relationship, and Positive coping skills or problem solving skills  Continued Clinical Symptoms:  Bipolar Disorder:   Mixed State  Cognitive Features That Contribute To Risk:  None    Suicide Risk:  Minimal: No identifiable suicidal ideation.  Patients presenting with no risk factors but with morbid ruminations; may be classified as minimal risk based on the severity of the depressive symptoms    Plan Of Care/Follow-up recommendations:  Activity:  As tolerated  Allyn Foil, MD 02/19/2024, 9:33 AM

## 2024-02-23 ENCOUNTER — Encounter: Payer: Self-pay | Admitting: Adult Health

## 2024-02-23 ENCOUNTER — Other Ambulatory Visit (HOSPITAL_COMMUNITY): Payer: Self-pay

## 2024-02-23 ENCOUNTER — Other Ambulatory Visit: Payer: Self-pay | Admitting: Adult Health

## 2024-02-23 DIAGNOSIS — F209 Schizophrenia, unspecified: Secondary | ICD-10-CM

## 2024-02-23 DIAGNOSIS — E1169 Type 2 diabetes mellitus with other specified complication: Secondary | ICD-10-CM

## 2024-02-23 MED ORDER — METFORMIN HCL ER 750 MG PO TB24
750.0000 mg | ORAL_TABLET | Freq: Every day | ORAL | 1 refills | Status: DC
  Filled 2024-02-23: qty 90, 90d supply, fill #0

## 2024-02-23 MED ORDER — DIVALPROEX SODIUM ER 250 MG PO TB24
250.0000 mg | ORAL_TABLET | Freq: Every day | ORAL | 1 refills | Status: DC
  Filled 2024-02-23: qty 90, 90d supply, fill #0
  Filled 2024-05-20: qty 90, 90d supply, fill #1

## 2024-02-23 NOTE — Telephone Encounter (Signed)
 Talked to daughter via phone and sent eRx for Depakote  ER 250 mg and Metformin .

## 2024-02-23 NOTE — Progress Notes (Signed)
 Talked to daughter regarding request to decrease Depakote  ER dosage to 250 mg due to tremors. She will schedule an appointment to be seen by psychiatry and at [facility]. Erx sent to [facility].

## 2024-02-23 NOTE — H&P (Signed)
 H and P note for documentation purposes on 8/24- admitted and immediately discharged due to requiring higher level of care.   Identifying Information The patient is a 58 y.o. female with a medical history of seizure disorder and Asthma vs COPD and a complicated past psychaitric history that includes mood and psychotic symptoms in the context of longstanding substance use (cocaine, marijuana, alcohol ) as well as personality pathology (borderline personality disorder). She presented on this admission on IVC from daughter with reported concerns of SI with plan to OD.       Psychiatric history: Patient has a long and complicated psychiatric history with numerous prior admissions for mood symptoms (elevated and depressed), SI, suicidal gestures and mild psychotic symptoms (hallucinations) in the absence of clear disorganized thoughts/behavior. For these concerns she has been given numerous diagnoses in the past including bipolar disorder, SAD, substance-induced mood and psychotic disorders, etc. She does carry a clear borderline personality disorder based on lifelong instability in relationships, poor coping, impulsivity, affective lability, chronic SI, recurrent suicidal gestures and attempts (frequent OD on her medications). On inpatient psychiatric admissions she is known to be disruptive, and often non-compliant with care in the absence of manic and psychotic symptoms.  In addition to underlying personality pathology she often presents with worsening of mood or vague psychotic symptoms in the context of substance use, primarily alcohol  and cocaine. The patient was most recently admitted to psychiatry in November 2024 where she presented with SI and irritability in the setting of substance use. She became frustrated during admission, non-compliant with care and asked to leave. This was deemed her baseline and she was dischahrged from the hospital after a few days.    Pt's recent history is more notable for  numerous ED visits related to poor self-care regarding medical concerns. Frequently in the ED for SOB due to ongoing smoking and poor medication adherence. Notably, at most recent Neurology outpatient visit the patient scored low (21) on cognitive testing and she has been referred to neuropsychology for a formal evaluation, however consistent with at least mild cognitive impairment.     Upon arrival: On the unit the patient was noted to be sat at 85. She reportedly takes oxygen  at home at night and there have been concerns that the patient requires a higher level of care than can be offered at [facility]. She will be transferred to geriatric psychiatry.    Preliminary plan included the following, recommended at least medical medicines be restarted as below:   Psychiatric Concerns  -Continue Abilify  5 mg daily for mood lability   Substance use concerns  Alcohol  use disorder -Continue Librium  taper:             -25 mg TID  8/24             -25 mg BID 8/25             -25 mg once 8/26   Nicotine  Replacement  -21 mg patch    Medical concerns COPD vs asthma -pt frequntly in the ED for shortness of breath, often receives duo-nebs and/or prednisone . Reportedly worsened by cigarettes and general medication non-adherence per ED notes   -O2 Sat 85 this morning, asymptomatic, however on periodic oxygen  at home -Albuterol  PRN -Restart home fluticasone -furoate (Breo-Ellipta) 1 puff daily    Seizures: Ordered home regimen per most recent dispense: -Keppra  500 mg BID -Zonisimide 200 mg daily       Patient to be discharged from [facility] and transferred to geriatric psychiatry  for a higher level of care      Leita Arts, MD

## 2024-02-24 ENCOUNTER — Emergency Department (HOSPITAL_COMMUNITY): Admission: EM | Admit: 2024-02-24 | Discharge: 2024-02-24 | Disposition: A | Discharge status: Home / Self Care

## 2024-02-24 ENCOUNTER — Other Ambulatory Visit (HOSPITAL_COMMUNITY): Payer: Self-pay

## 2024-02-24 ENCOUNTER — Telehealth: Payer: Self-pay | Admitting: Surgery

## 2024-02-24 ENCOUNTER — Other Ambulatory Visit: Payer: Self-pay

## 2024-02-24 DIAGNOSIS — K0889 Other specified disorders of teeth and supporting structures: Secondary | ICD-10-CM

## 2024-02-24 DIAGNOSIS — K029 Dental caries, unspecified: Secondary | ICD-10-CM | POA: Diagnosis not present

## 2024-02-24 MED ORDER — KETOROLAC TROMETHAMINE 15 MG/ML IJ SOLN
15.0000 mg | Freq: Once | INTRAMUSCULAR | Status: AC
  Administered 2024-02-24: 15 mg via INTRAMUSCULAR
  Filled 2024-02-24: qty 1

## 2024-02-24 MED ORDER — CLINDAMYCIN HCL 150 MG PO CAPS
300.0000 mg | ORAL_CAPSULE | Freq: Three times a day (TID) | ORAL | 0 refills | Status: AC
  Filled 2024-02-24: qty 42, 7d supply, fill #0

## 2024-02-24 MED ORDER — OXYCODONE-ACETAMINOPHEN 5-325 MG PO TABS
1.0000 | ORAL_TABLET | Freq: Once | ORAL | Status: AC
  Administered 2024-02-24: 1 via ORAL
  Filled 2024-02-24: qty 1

## 2024-02-24 MED ORDER — CLINDAMYCIN HCL 150 MG PO CAPS
300.0000 mg | ORAL_CAPSULE | Freq: Once | ORAL | Status: AC
  Administered 2024-02-24: 300 mg via ORAL
  Filled 2024-02-24: qty 2

## 2024-02-24 NOTE — Discharge Instructions (Signed)
 Take your antibiotics as prescribed.  You can call and try to follow-up with 1 of these dental urgent care places today or tomorrow:  [facility]  [address]  [phone]  [facility] [address] [place] [phone]

## 2024-02-24 NOTE — Telephone Encounter (Signed)
 Received call from patient's daughter prescription was sent to wrong pharmacy. Request to be sent to [facility] on [address], script sent. Updated patient's daughter. No further ED CM needs identified.

## 2024-02-24 NOTE — ED Provider Notes (Signed)
 [place] EMERGENCY DEPARTMENT AT [facility] Provider Note   CSN: [id number] Arrival date & time: 02/24/24  9165     Patient presents with: Dental Pain   [name] is a 58 y.o. female.   58 year old female presents for evaluation of dental pain.  States she has multiple broken teeth and the pain started yesterday.  States today she woke up and is having a hard time eating secondary to pain.  Has not seen a dentist yet.  Denies any other symptoms or concerns at this time.   Dental Pain Associated symptoms: no fever        Prior to Admission medications   Medication Sig Start Date End Date Taking? Authorizing Provider  clindamycin  (CLEOCIN ) 150 MG capsule Take 2 capsules (300 mg total) by mouth 3 (three) times daily for 7 days. 02/24/24 03/02/24 Yes Imo Cumbie L, DO  albuterol  (VENTOLIN  HFA) 108 (90 Base) MCG/ACT inhaler Inhale 2 puffs into the lungs every 6 (six) hours as needed for wheezing or shortness of breath. 02/19/24   Donnelly Mellow, MD  ARIPiprazole  (ABILIFY ) 5 MG tablet Take 1 tablet (5 mg total) by mouth daily. 02/15/24   Towana Leita SAILOR, MD  ARIPiprazole  ER (ABILIFY  MAINTENA) 400 MG PRSY prefilled syringe Inject 400 mg into the muscle every 28 (twenty-eight) days. 02/19/24   Jadapalle, Sree, MD  divalproex  (DEPAKOTE  ER) 250 MG 24 hr tablet Take 1 tablet (250 mg total) by mouth daily. 02/23/24   Medina-Vargas, Monina C, NP  lidocaine  (LIDODERM ) 5 % Place 1 patch onto the skin daily. Remove & Discard patch within 12 hours or as directed by MD 02/19/24   Donnelly Mellow, MD  metFORMIN  (GLUCOPHAGE -XR) 750 MG 24 hr tablet Take 1 tablet (750 mg total) by mouth daily with breakfast. 02/23/24   Medina-Vargas, Monina C, NP  thiamine  (VITAMIN B-1) 100 MG tablet Take 1 tablet (100 mg total) by mouth daily. 02/15/24   Towana Leita SAILOR, MD  traZODone  (DESYREL ) 50 MG tablet Take 1 tablet (50 mg total) by mouth at bedtime. 02/19/24   Donnelly Mellow, MD    Allergies: Iron   dextran, Aspirin , and Penicillins    Review of Systems  Constitutional:  Negative for chills and fever.  HENT:  Positive for dental problem. Negative for ear pain and sore throat.   Eyes:  Negative for pain and visual disturbance.  Respiratory:  Negative for cough and shortness of breath.   Cardiovascular:  Negative for chest pain and palpitations.  Gastrointestinal:  Negative for abdominal pain and vomiting.  Genitourinary:  Negative for dysuria and hematuria.  Musculoskeletal:  Negative for arthralgias and back pain.  Skin:  Negative for color change and rash.  Neurological:  Negative for seizures and syncope.  All other systems reviewed and are negative.   Updated Vital Signs BP (!) 158/77   Pulse (!) 104   Temp 97.8 F (36.6 C) (Oral)   Resp 18   LMP 01/08/2011   SpO2 99%   Physical Exam Vitals and nursing note reviewed.  Constitutional:      General: She is not in acute distress.    Appearance: Normal appearance. She is well-developed. She is not ill-appearing.  HENT:     Head: Normocephalic and atraumatic.     Mouth/Throat:     Comments: Poor dentition with multiple dental caries, right lower molar is broken and she does have multiple broken teeth on the upper right and lower left as well. Eyes:  Conjunctiva/sclera: Conjunctivae normal.  Cardiovascular:     Rate and Rhythm: Normal rate and regular rhythm.     Heart sounds: No murmur heard. Pulmonary:     Effort: Pulmonary effort is normal. No respiratory distress.     Breath sounds: Normal breath sounds.  Abdominal:     Palpations: Abdomen is soft.     Tenderness: There is no abdominal tenderness.  Musculoskeletal:        General: No swelling.     Cervical back: Neck supple.  Skin:    General: Skin is warm and dry.     Capillary Refill: Capillary refill takes less than 2 seconds.  Neurological:     Mental Status: She is alert.  Psychiatric:        Mood and Affect: Mood normal.     (all labs ordered  are listed, but only abnormal results are displayed) Labs Reviewed - No data to display  EKG: None  Radiology: No results found.   Procedures   Medications Ordered in the ED  ketorolac  (TORADOL ) 15 MG/ML injection 15 mg (has no administration in time range)  oxyCODONE -acetaminophen  (PERCOCET/ROXICET) 5-325 MG per tablet 1 tablet (has no administration in time range)  clindamycin  (CLEOCIN ) capsule 300 mg (has no administration in time range)                                    Medical Decision Making Social determinants of health: Admission for behavioral health in the past, no dental follow-up  Patient here for dental pain.  Was given oxycodone  and Toradol  here.  Advised continue Tylenol  and Motrin  as needed and I will start her on statin as well.  She is allergic to Augmentin .  Provided her with information for urgent care dental facilities in the area and advise close follow-up with them.  Advised return for new or worsening symptoms.  She is currently being discharged home.  Problems Addressed: Pain, dental: acute illness or injury  Amount and/or Complexity of Data Reviewed External Data Reviewed: notes.    Details: Prior records reviewed patient admitted for behavioral health in the past  Risk OTC drugs. Prescription drug management. Diagnosis or treatment significantly limited by social determinants of health.     Final diagnoses:  Pain, dental    ED Discharge Orders          Ordered    clindamycin  (CLEOCIN ) 150 MG capsule  3 times daily        02/24/24 0902               Amilliana Hayworth, Duwaine L, DO 02/24/24 [phone]

## 2024-02-24 NOTE — ED Triage Notes (Signed)
 Pt here for bilateral 9/10 mouth pain. States she can't eat b/c of pain.

## 2024-02-25 ENCOUNTER — Other Ambulatory Visit (HOSPITAL_COMMUNITY): Payer: Self-pay

## 2024-02-25 MED ORDER — CLINDAMYCIN HCL 300 MG PO CAPS
300.0000 mg | ORAL_CAPSULE | Freq: Four times a day (QID) | ORAL | 0 refills | Status: DC
  Filled 2024-02-25 (×2): qty 28, 7d supply, fill #0

## 2024-02-25 MED ORDER — ACETAMINOPHEN-CODEINE 300-30 MG PO TABS
1.0000 | ORAL_TABLET | Freq: Four times a day (QID) | ORAL | 0 refills | Status: DC
  Filled 2024-02-25: qty 16, 4d supply, fill #0

## 2024-02-25 MED ORDER — IBUPROFEN 600 MG PO TABS
600.0000 mg | ORAL_TABLET | Freq: Four times a day (QID) | ORAL | 0 refills | Status: DC
  Filled 2024-02-25: qty 20, 5d supply, fill #0

## 2024-02-29 ENCOUNTER — Other Ambulatory Visit (HOSPITAL_COMMUNITY): Payer: Self-pay

## 2024-03-04 ENCOUNTER — Other Ambulatory Visit: Payer: Self-pay

## 2024-03-10 ENCOUNTER — Ambulatory Visit

## 2024-03-11 ENCOUNTER — Ambulatory Visit

## 2024-03-15 ENCOUNTER — Telehealth: Payer: Self-pay

## 2024-03-15 NOTE — Telephone Encounter (Signed)
 Message routed to PCP Medina-Vargas, Monina C, NP as FYI.

## 2024-03-15 NOTE — Telephone Encounter (Signed)
 Copied from CRM [id number]. Topic: General - Other >> Mar 10, 2024  3:42 PM Cherylann RAMAN wrote: Reason for CRM: Fonda with [facility] called to verify if clinical for was was received from them for the recommendations for PT. Reached out to CAL after confirming that it was not uploaded into patient's chart. Form was sent on 09/12. CAL stated that the form was not received. Informed Fonda and he verbalized understanding and he will refax. >> Mar 15, 2024 10:16 AM Susanna ORN wrote: Lucious, Clinical Pharmacist, with [employer], calling to check if the clinical recommendations were received. Didn't see anything in chart. Called CAL & spoke with Darice and she checked and stated that the person that checks them is busy at the moment. Brock left the number to contact him back. [phone]. He stated they they can ask to speak with any of the pharmacist.

## 2024-03-15 NOTE — Telephone Encounter (Signed)
 Noted

## 2024-03-23 ENCOUNTER — Other Ambulatory Visit (HOSPITAL_COMMUNITY): Payer: Self-pay
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
## 2024-03-28 ENCOUNTER — Encounter: Payer: Self-pay | Admitting: Adult Health

## 2024-03-28 ENCOUNTER — Encounter (HOSPITAL_COMMUNITY): Payer: Self-pay

## 2024-03-28 ENCOUNTER — Ambulatory Visit: Admitting: Adult Health

## 2024-03-28 ENCOUNTER — Emergency Department (HOSPITAL_COMMUNITY)
Admission: EM | Admit: 2024-03-28 | Discharge: 2024-03-30 | Disposition: A | Discharge status: Other Acute Inpatient Hospital | Attending: Emergency Medicine | Admitting: Emergency Medicine

## 2024-03-28 DIAGNOSIS — R404 Transient alteration of awareness: Secondary | ICD-10-CM | POA: Diagnosis not present

## 2024-03-28 DIAGNOSIS — F1490 Cocaine use, unspecified, uncomplicated: Secondary | ICD-10-CM

## 2024-03-28 DIAGNOSIS — F1410 Cocaine abuse, uncomplicated: Secondary | ICD-10-CM | POA: Diagnosis not present

## 2024-03-28 DIAGNOSIS — F603 Borderline personality disorder: Secondary | ICD-10-CM | POA: Diagnosis not present

## 2024-03-28 DIAGNOSIS — R519 Headache, unspecified: Secondary | ICD-10-CM | POA: Diagnosis not present

## 2024-03-28 DIAGNOSIS — R109 Unspecified abdominal pain: Secondary | ICD-10-CM | POA: Diagnosis not present

## 2024-03-28 DIAGNOSIS — Z7984 Long term (current) use of oral hypoglycemic drugs: Secondary | ICD-10-CM | POA: Diagnosis not present

## 2024-03-28 DIAGNOSIS — E119 Type 2 diabetes mellitus without complications: Secondary | ICD-10-CM | POA: Insufficient documentation

## 2024-03-28 DIAGNOSIS — F259 Schizoaffective disorder, unspecified: Secondary | ICD-10-CM | POA: Insufficient documentation

## 2024-03-28 DIAGNOSIS — F1010 Alcohol abuse, uncomplicated: Secondary | ICD-10-CM | POA: Diagnosis not present

## 2024-03-28 DIAGNOSIS — F209 Schizophrenia, unspecified: Secondary | ICD-10-CM | POA: Diagnosis not present

## 2024-03-28 DIAGNOSIS — J449 Chronic obstructive pulmonary disease, unspecified: Secondary | ICD-10-CM | POA: Diagnosis not present

## 2024-03-28 DIAGNOSIS — R4182 Altered mental status, unspecified: Secondary | ICD-10-CM | POA: Diagnosis present

## 2024-03-28 LAB — CBC WITH DIFFERENTIAL/PLATELET
Abs Immature Granulocytes: 0.04 K/uL (ref 0.00–0.07)
Basophils Absolute: 0 K/uL (ref 0.0–0.1)
Basophils Relative: 0 %
Eosinophils Absolute: 0 K/uL (ref 0.0–0.5)
Eosinophils Relative: 0 %
HCT: 29.2 % — ABNORMAL LOW (ref 36.0–46.0)
Hemoglobin: 8.9 g/dL — ABNORMAL LOW (ref 12.0–15.0)
Immature Granulocytes: 0 %
Lymphocytes Relative: 13 %
Lymphs Abs: 1.3 K/uL (ref 0.7–4.0)
MCH: 22.1 pg — ABNORMAL LOW (ref 26.0–34.0)
MCHC: 30.5 g/dL (ref 30.0–36.0)
MCV: 72.5 fL — ABNORMAL LOW (ref 80.0–100.0)
Monocytes Absolute: 0.7 K/uL (ref 0.1–1.0)
Monocytes Relative: 6 %
Neutro Abs: 8.3 K/uL — ABNORMAL HIGH (ref 1.7–7.7)
Neutrophils Relative %: 81 %
Platelets: 251 K/uL (ref 150–400)
RBC: 4.03 MIL/uL (ref 3.87–5.11)
RDW: 18.5 % — ABNORMAL HIGH (ref 11.5–15.5)
WBC: 10.3 K/uL (ref 4.0–10.5)
nRBC: 0 % (ref 0.0–0.2)

## 2024-03-28 LAB — COMPREHENSIVE METABOLIC PANEL WITH GFR
ALT: 20 U/L (ref 0–44)
AST: 40 U/L (ref 15–41)
Albumin: 3.1 g/dL — ABNORMAL LOW (ref 3.5–5.0)
Alkaline Phosphatase: 76 U/L (ref 38–126)
Anion gap: 11 (ref 5–15)
BUN: 7 mg/dL (ref 6–20)
CO2: 21 mmol/L — ABNORMAL LOW (ref 22–32)
Calcium: 7.7 mg/dL — ABNORMAL LOW (ref 8.9–10.3)
Chloride: 101 mmol/L (ref 98–111)
Creatinine, Ser: 0.52 mg/dL (ref 0.44–1.00)
GFR, Estimated: >60 mL/min (ref >60)
Glucose, Bld: 109 mg/dL — ABNORMAL HIGH (ref 70–99)
Potassium: 3.9 mmol/L (ref 3.5–5.1)
Sodium: 133 mmol/L — ABNORMAL LOW (ref 135–145)
Total Bilirubin: 1.5 mg/dL — ABNORMAL HIGH (ref 0.0–1.2)
Total Protein: 6.5 g/dL (ref 6.5–8.1)

## 2024-03-28 LAB — RAPID URINE DRUG SCREEN, HOSP PERFORMED
Amphetamines: NOT DETECTED
Barbiturates: NOT DETECTED
Benzodiazepines: NOT DETECTED
Cocaine: POSITIVE — AB
Opiates: NOT DETECTED
Tetrahydrocannabinol: NOT DETECTED

## 2024-03-28 LAB — SALICYLATE LEVEL: Salicylate Lvl: <7 mg/dL — ABNORMAL LOW (ref 7.0–30.0)

## 2024-03-28 LAB — URINALYSIS, ROUTINE W REFLEX MICROSCOPIC
Bilirubin Urine: NEGATIVE
Glucose, UA: NEGATIVE mg/dL
Hgb urine dipstick: NEGATIVE
Ketones, ur: NEGATIVE mg/dL
Leukocytes,Ua: NEGATIVE
Nitrite: NEGATIVE
Protein, ur: 30 mg/dL — AB
Specific Gravity, Urine: 1.02 (ref 1.005–1.030)
pH: 5 (ref 5.0–8.0)

## 2024-03-28 LAB — ACETAMINOPHEN LEVEL: Acetaminophen (Tylenol), Serum: <10 ug/mL — ABNORMAL LOW (ref 10–30)

## 2024-03-28 LAB — VALPROIC ACID LEVEL: Valproic Acid Lvl: <10 ug/mL — ABNORMAL LOW (ref 50–100)

## 2024-03-28 LAB — ETHANOL: Alcohol, Ethyl (B): <15 mg/dL (ref <15)

## 2024-03-28 MED ORDER — LORAZEPAM 1 MG PO TABS: 1.0000 mg | ORAL_TABLET | ORAL | Status: DC | PRN

## 2024-03-28 MED ORDER — RISPERIDONE 1 MG PO TBDP: 2.0000 mg | ORAL_TABLET | Freq: Three times a day (TID) | ORAL | Status: DC | PRN

## 2024-03-28 MED ORDER — ZOLPIDEM TARTRATE 5 MG PO TABS: 5.0000 mg | ORAL_TABLET | Freq: Every evening | ORAL | Status: DC | PRN

## 2024-03-28 MED ORDER — NICOTINE 21 MG/24HR TD PT24
21.0000 mg | MEDICATED_PATCH | Freq: Every day | TRANSDERMAL | Status: DC
  Administered 2024-03-29 – 2024-03-30 (×2): 21 mg via TRANSDERMAL
  Filled 2024-03-28 (×3): qty 1

## 2024-03-28 MED ORDER — ONDANSETRON HCL 4 MG PO TABS
4.0000 mg | ORAL_TABLET | Freq: Three times a day (TID) | ORAL | Status: DC | PRN
  Administered 2024-03-28: 4 mg via ORAL
  Filled 2024-03-28: qty 1

## 2024-03-28 MED ORDER — ACETAMINOPHEN 325 MG PO TABS
650.0000 mg | ORAL_TABLET | ORAL | Status: DC | PRN
  Administered 2024-03-28 – 2024-03-29 (×2): 650 mg via ORAL
  Filled 2024-03-28 (×2): qty 2

## 2024-03-28 MED ORDER — ZIPRASIDONE MESYLATE 20 MG IM SOLR: 20.0000 mg | INTRAMUSCULAR | Status: DC | PRN

## 2024-03-28 MED ORDER — ALUM & MAG HYDROXIDE-SIMETH 200-200-20 MG/5ML PO SUSP
30.0000 mL | Freq: Four times a day (QID) | ORAL | Status: DC | PRN
  Administered 2024-03-30: 30 mL via ORAL
  Filled 2024-03-28 (×2): qty 30

## 2024-03-28 NOTE — Progress Notes (Signed)
 Inpatient Psychiatric Referral  Patient was recommended inpatient per Nancyann Alert, NP. There are no available beds at [facility], per [facility]. Patient was referred to the following out of network facilities:  [facility] Provider Address Phone Fax  [facility]  [address], [place] [address] [phone]  [facility]  [address] [place] [address] [phone]  [facility]  [address] [place], [place] [address] [phone]  [facility]  [address] [place] [address] [phone]  [facility]  [address], [place] [address] [phone]  [facility]  [address] [place] [address] [phone]  [facility]  [address], [place] [address] [phone]  [facility] EFAX  [address] [place], [place] [phone]  [facility]  [address], [place] [address] [phone]  [facility]  [address] [place] [address] [phone]    Situation ongoing, CSW to continue following and update chart as more information becomes available.  Harrie Sofia MSW, [place] 03/28/2024

## 2024-03-28 NOTE — ED Triage Notes (Signed)
 Pt bibems from home. Pt altered. Pt walked home from daughter's house and when daughter went to check on the pt she was altered. Normal vital signs with EMS.

## 2024-03-28 NOTE — ED Notes (Signed)
 IVC FINDING AND CUSTODY UPLOAD AND SENT TO THE COURT OF CLERK CASE NUMBER [id number] AND  ENV. NO FOR FINDINGS AND CUSTODY [phone]

## 2024-03-28 NOTE — ED Notes (Signed)
 IVC paperwork is in process.

## 2024-03-28 NOTE — Consult Note (Signed)
 [facility] Consult Note  Patient Name: [name] MRN: [id number] DOB: February 25, 1966 DATE OF Consult: 03/28/2024  PRIMARY PSYCHIATRIC DIAGNOSES  1.  Schizophrenia 2.  r/o substance abuse 3.  Borderline personality disorder  RECOMMENDATIONS  Recommendations: Medication recommendations: reconcile and continue meds per OP regimen; for empiric interim treatment consider olanzapine  10 mg PO nightly; can consider PRN hydroxyzine  50 mg q6h PRN anxiety/agitation  Non-Medication/therapeutic recommendations: Psych admission  Is inpatient psychiatric hospitalization recommended for this patient? Yes (Explain why): psychosis  Follow-Up Telepsychiatry C/L services: We will sign off for now. Please re-consult our service if needed for any concerning changes in the patient's condition, discharge planning, or questions.  Communication: Treatment team members (and family members if applicable) who were involved in treatment/care discussions and planning, and with whom we spoke or engaged with via secure text/chat, include the following: EDRN  Thank you for involving us  in the care of this patient. If you have any additional questions or concerns, please call [phone] and ask for me or the provider on-call.   TELEPSYCHIATRY ATTESTATION & CONSENT  As the provider for this telehealth consult, I attest that I verified the patient's identity using two separate identifiers, introduced myself to the patient, provided my credentials, disclosed my location, and performed this encounter via a HIPAA-compliant, real-time, face-to-face, two-way, interactive audio and video platform and with the full consent and agreement of the patient (or guardian as applicable. Patient physical location: ED.at [facility] provider physical location: home office in state of [place] .  Video start time: 913p CST (Central Time) Video end time: 926p CST (Central Time)  IDENTIFYING DATA  [name] is a 58 y.o. year-old female for whom a psychiatric consultation has been ordered by the primary provider. The patient was identified using two separate identifiers.  CHIEF COMPLAINT/REASON FOR CONSULT  Altered mental status; schizoaffective disorder;   I feel bad  - patient  HISTORY OF PRESENT ILLNESS (HPI)  The patient is a 58 year old woman with reported history of schizophrenia and borderline PD who presents to ED after reportedly sounding altered on the phone to her daughter in context of missing her scheduled neurology appointment; EMS was contacted and reportedly there were multiple drug paraphernalia items and given patient's prior h/o suicide attempt and drug overdose, she did present to ED. In the ED, she has been impulsive, removing leads and appearing confused. Psychiatry consulted given above.  The patient, when seen, appears restless, rocking (while sitting) back and forth at times. She is oriented to self and place but unable to recall either the month or year. She has a somatic thought process as she'll perseverate and repeatedly shift conversation and/or answer seemingly unrelated queries with complains of her stomach hurting and feeling bad. She'll relate this to drinking some water  and then reject this when asked for clarification; her thought process appears disorganized. She does not recognize any mental health diagnosis and states her only medicine is for her stomach. She states she had a 40-oz beer today and denies any other recent substance use. Her UDS is pending.  She denies SI, HI; describes a somewhat ambiguous denial of AVH (not really - patient). She is, at present. A limited historian. Differential includes decompensated schizophrenia vs substance-induced altered mental status vs multifactorial. At present I think she'd benefit from hospitalization and further evaluation as she does not appear at baseline nor capable of safe functioning outside of HLOC.   PAST  PSYCHIATRIC HISTORY  She has been  admitted in the past, most recently 1-2 months ago She does have history of suicide attempts; drug overdose She has prior med trials, though patient only recalls a stomach med. Unknown history of trauma/abuse  Otherwise as per HPI above.  PAST MEDICAL HISTORY  Past Medical History:  Diagnosis Date   Abdominal pain    Accidental drug overdose April 2013   Anxiety    Atrial fibrillation (HCC) 09/29/11   converted spontaneously   Chronic back pain    Chronic knee pain    Chronic nausea    Chronic pain    COPD (chronic obstructive pulmonary disease) (HCC)    Depression    Diabetes mellitus    states her doctor took her off all DM meds in past month   Diabetic neuropathy (HCC)    Dyspnea    with exertion    GERD (gastroesophageal reflux disease)    Headache(784.0)    migraines    HTN (hypertension)    not on meds since in a year    Hyperlipidemia    Hypothyroidism    not on meds in a while    Mental disorder    Bipolar and schizophrenic   Nausea and vomiting 01/02/2023   Requires supplemental oxygen     as needed per patient    Schizophrenia (HCC)    Schizophrenia, acute (HCC) 11/13/2017   Tobacco abuse    3  HOME MEDICATIONS  Facility Ordered Medications  Medication   risperiDONE  (RISPERDAL  M-TABS) disintegrating tablet 2 mg   And   LORazepam  (ATIVAN ) tablet 1 mg   And   ziprasidone  (GEODON ) injection 20 mg   acetaminophen  (TYLENOL ) tablet 650 mg   zolpidem  (AMBIEN ) tablet 5 mg   ondansetron  (ZOFRAN ) tablet 4 mg   alum & mag hydroxide-simeth (MAALOX/MYLANTA) 200-200-20 MG/5ML suspension 30 mL   nicotine  (NICODERM CQ  - dosed in mg/24 hours) patch 21 mg   PTA Medications  Medication Sig   albuterol  (VENTOLIN  HFA) 108 (90 Base) MCG/ACT inhaler Inhale 2 puffs into the lungs every 4 (four) hours as needed for wheezing or shortness of breath.   metFORMIN  (GLUCOPHAGE -XR) 750 MG 24 hr tablet Take 1 tablet (750 mg total) by mouth daily with  breakfast.   divalproex  (DEPAKOTE  ER) 250 MG 24 hr tablet Take 1 tablet (250 mg total) by mouth daily.   ibuprofen  (ADVIL ) 600 MG tablet Take 1 tablet (600 mg total) by mouth every 6 (six) hours as needed for pain     ALLERGIES  Allergies  Allergen Reactions   Iron  Dextran Shortness Of Breath and Anxiety   Aspirin  Nausea And Vomiting and Other (See Comments)    Ok to take tylenol  or ibuprofen     Penicillins Other (See Comments)    Unknown reaction from childhood: family would like this to remain as an allergy.  Tolerated cephalosporins and zosyn     SOCIAL & SUBSTANCE USE HISTORY  Social History   Socioeconomic History   Marital status: Widowed    Spouse name: Not on file   Number of children: 3   Years of education: Not on file   Highest education level: GED or equivalent  Occupational History   Not on file  Tobacco Use   Smoking status: Every Day    Current packs/day: 1.00    Types: Cigarettes   Smokeless tobacco: Never  Vaping Use   Vaping status: Never Used  Substance and Sexual Activity   Alcohol  use: Yes    Comment: 80 ounces daily  Drug use: Yes    Types: Cocaine, Marijuana, Crack cocaine    Comment: last use- 03/19/2023   Sexual activity: Not Currently    Partners: Male  Other Topics Concern   Not on file  Social History Narrative   Not on file   Social Drivers of Health   Financial Resource Strain: Low Risk  (02/05/2024)   Overall Financial Resource Strain (CARDIA)    Difficulty of Paying Living Expenses: Not hard at all  Food Insecurity: No Food Insecurity (02/14/2024)   Hunger Vital Sign    Worried About Running Out of Food in the Last Year: Never true    Ran Out of Food in the Last Year: Never true  Transportation Needs: No Transportation Needs (02/14/2024)   PRAPARE - Administrator, Civil Service (Medical): No    Lack of Transportation (Non-Medical): No  Physical Activity: Inactive (02/05/2024)   Exercise Vital Sign    Days of  Exercise per Week: 0 days    Minutes of Exercise per Session: Not on file  Stress: Stress Concern Present (02/05/2024)   [employer] of Occupational Health - Occupational Stress Questionnaire    Feeling of Stress: Very much  Social Connections: Socially Isolated (02/05/2024)   Social Connection and Isolation Panel    Frequency of Communication with Friends and Family: More than three times a week    Frequency of Social Gatherings with Friends and Family: More than three times a week    Attends Religious Services: Never    Database administrator or Organizations: No    Attends Banker Meetings: Not on file    Marital Status: Widowed   Social History   Tobacco Use  Smoking Status Every Day   Current packs/day: 1.00   Types: Cigarettes  Smokeless Tobacco Never   Social History   Substance and Sexual Activity  Alcohol  Use Yes   Comment: 80 ounces daily   Social History   Substance and Sexual Activity  Drug Use Yes   Types: Cocaine, Marijuana, Crack cocaine   Comment: last use- 03/19/2023     FAMILY HISTORY  Family History  Problem Relation Age of Onset   Diabetes Mother    Heart disease Mother    Hypertension Mother    Heart attack Father        60s   Heart attack Sister        67   COPD Other    Breast cancer Neg Hx    Family Psychiatric History (if known):  none provided  MENTAL STATUS EXAM (MSE)  Mental Status Exam: General Appearance: casual attire, sitting on stretcher; confused  Orientation:  partial (oriented to self; place; disoriented to month, year)  Memory:  poor long and short-term recall  Concentration:  fair  Recall:  limited  Attention  fair  Eye Contact:  variable  Speech:  clear  Language:  fluent English  Volume:  Normal  Mood: I feel bad - patient  Affect:  dysphoric though labile  Thought Process:  disorganized; tangential  Thought Content:  delusions  Suicidal Thoughts:  denies  Homicidal Thoughts:  denies   Judgement:  limited  Insight:  limited  Psychomotor Activity:  restless  Akathisia:  none immediately evident  Fund of Knowledge:  fair     VITALS  Blood pressure 130/71, temperature 98.9 F (37.2 C), resp. rate 20, last menstrual period 01/08/2011, SpO2 98%.  LABS  Admission on 03/28/2024  Component Date Value Ref Range  Status   Sodium 03/28/2024 133 (L)  135 - 145 mmol/L Final   Potassium 03/28/2024 3.9  3.5 - 5.1 mmol/L Final   HEMOLYSIS AT THIS LEVEL MAY AFFECT RESULT   Chloride 03/28/2024 101  98 - 111 mmol/L Final   CO2 03/28/2024 21 (L)  22 - 32 mmol/L Final   Glucose, Bld 03/28/2024 109 (H)  70 - 99 mg/dL Final   Glucose reference range applies only to samples taken after fasting for at least 8 hours.   BUN 03/28/2024 7  6 - 20 mg/dL Final   Creatinine, Ser 03/28/2024 0.52  0.44 - 1.00 mg/dL Final   Calcium  03/28/2024 7.7 (L)  8.9 - 10.3 mg/dL Final   Total Protein 89/93/7974 6.5  6.5 - 8.1 g/dL Final   Albumin  03/28/2024 3.1 (L)  3.5 - 5.0 g/dL Final   AST 89/93/7974 40  15 - 41 U/L Final   HEMOLYSIS AT THIS LEVEL MAY AFFECT RESULT   ALT 03/28/2024 20  0 - 44 U/L Final   HEMOLYSIS AT THIS LEVEL MAY AFFECT RESULT   Alkaline Phosphatase 03/28/2024 76  38 - 126 U/L Final   Total Bilirubin 03/28/2024 1.5 (H)  0.0 - 1.2 mg/dL Final   HEMOLYSIS AT THIS LEVEL MAY AFFECT RESULT   GFR, Estimated 03/28/2024 >60  >60 mL/min Final   Comment: (NOTE) Calculated using the CKD-EPI Creatinine Equation (2021)    Anion gap 03/28/2024 11  5 - 15 Final   Performed at [facility], [address] [place], [place] [address]   Alcohol , Ethyl (B) 03/28/2024 <15  <15 mg/dL Final   Comment: (NOTE) For medical purposes only. Performed at [facility], [address] [place], [place] [address]    WBC 03/28/2024 10.3  4.0 - 10.5 K/uL Final   RBC 03/28/2024 4.03  3.87 - 5.11 MIL/uL Final   Hemoglobin 03/28/2024 8.9 (L)  12.0 - 15.0 g/dL Final   Comment: Reticulocyte Hemoglobin  testing may be clinically indicated, consider ordering this additional test [id number]    HCT 03/28/2024 29.2 (L)  36.0 - 46.0 % Final   MCV 03/28/2024 72.5 (L)  80.0 - 100.0 fL Final   MCH 03/28/2024 22.1 (L)  26.0 - 34.0 pg Final   MCHC 03/28/2024 30.5  30.0 - 36.0 g/dL Final   RDW 89/93/7974 18.5 (H)  11.5 - 15.5 % Final   Platelets 03/28/2024 251  150 - 400 K/uL Final   nRBC 03/28/2024 0.0  0.0 - 0.2 % Final   Neutrophils Relative % 03/28/2024 81  % Final   Neutro Abs 03/28/2024 8.3 (H)  1.7 - 7.7 K/uL Final   Lymphocytes Relative 03/28/2024 13  % Final   Lymphs Abs 03/28/2024 1.3  0.7 - 4.0 K/uL Final   Monocytes Relative 03/28/2024 6  % Final   Monocytes Absolute 03/28/2024 0.7  0.1 - 1.0 K/uL Final   Eosinophils Relative 03/28/2024 0  % Final   Eosinophils Absolute 03/28/2024 0.0  0.0 - 0.5 K/uL Final   Basophils Relative 03/28/2024 0  % Final   Basophils Absolute 03/28/2024 0.0  0.0 - 0.1 K/uL Final   Immature Granulocytes 03/28/2024 0  % Final   Abs Immature Granulocytes 03/28/2024 0.04  0.00 - 0.07 K/uL Final   Performed at [facility], [address] [place], [place] [address]   Acetaminophen  (Tylenol ), Serum 03/28/2024 <10 (L)  10 - 30 ug/mL Final   Comment: (NOTE) Therapeutic concentrations vary significantly. A  range of 10-30 ug/mL  may be an effective concentration for many patients. However, some  are best treated at concentrations outside of this range. Acetaminophen  concentrations >150 ug/mL at 4 hours after ingestion  and >50 ug/mL at 12 hours after ingestion are often associated with  toxic reactions.  Performed at [facility], [address] [place], [place] [address]    Salicylate Lvl 03/28/2024 <7.0 (L)  7.0 - 30.0 mg/dL Final   Performed at [facility], [address] [place], [place] [address]   Valproic  Acid Lvl 03/28/2024 <10 (L)  50 - 100 ug/mL Final   Comment: RESULTS CONFIRMED BY MANUAL DILUTION Performed at [facility], [address], [place], [place] [address]     PSYCHIATRIC REVIEW OF SYSTEMS (ROS)  ROS: Notable for the following relevant positive findings: ROS limited due to AMS  + confusion  + delusions  + thought disorganization  Additional findings:      Musculoskeletal: No abnormal movements observed      Gait & Station: Laying/Sitting      Pain Screening: none endorsed      Nutrition & Dental Concerns: none endorsed  RISK FORMULATION/ASSESSMENT  Is the patient experiencing any suicidal or homicidal ideations: denies  Protective factors considered for safety management: family; prior treatment response  Risk factors/concerns considered for safety management:  Prior attempt Depression Substance abuse/dependence Physical illness/chronic pain  Is there a safety management plan with the patient and treatment team to minimize risk factors and promote protective factors: Yes           Explain: meds; psych admission Is crisis care placement or psychiatric hospitalization recommended: Yes     Based on my current evaluation and risk assessment, patient is determined at this time to be at:  High risk  *RISK ASSESSMENT Risk assessment is a dynamic process; it is possible that this patient's condition, and risk level, may change. This should be re-evaluated and managed over time as appropriate. Please re-consult psychiatric consult services if additional assistance is needed in terms of risk assessment and management. If your team decides to discharge this patient, please advise the patient how to best access emergency psychiatric services, or to call 911, if their condition worsens or they feel unsafe in any way.   Nancyann LITTIE Alert, MD Telepsychiatry Consult Services

## 2024-03-28 NOTE — ED Provider Notes (Signed)
 [place] EMERGENCY DEPARTMENT AT [facility] Provider Note   CSN: [id number] Arrival date & time: 03/28/24  1614     Patient presents with: Altered Mental Status   JACQUELYNE QUARRY is a 58 y.o. female.   The history is provided by the patient, a relative, medical records and the EMS personnel. No language interpreter was used.  Altered Mental Status    58 year old female history of schizoaffective disorder, COPD, prior suicidal attempt, possible substance use, diabetes, borderline personality disorder, brought here via EMS accompanied by daughter with concerns of suicidal attempt.  Per daughter who is one of our Child psychotherapist, patient was supposed to go to her neurology appointment today but daughter was unable to reach out to her until later when patient called her but appears altered on the phone.  Daughter voiced concern and contacted EMS.  Daughter states that she went over to the house and noticed multiple drug paraphernalia around the house as well as multiple pills scattered around the ashtrays and on the table.  She worries that her mother may have had a suicidal attempt causing confusion.  Daughter states patient appears to be a lot more confused than her baseline.  Daughter mention patient has history of drug overdose in the past.  She also has history of polysubstance use and appears to be using crack recently.  She also drinks alcohol .  Daughter mention patient has been depressed for the past several years after the death of her husband.  Patient was also recently had inpatient psychiatric hospitalization several weeks ago.  History is limited from patient as I was unable to obtain any cohesive story.  Patient did states I do not feel good she also complained of abdominal discomfort.  Prior to Admission medications   Medication Sig Start Date End Date Taking? Authorizing Provider  acetaminophen -codeine  (TYLENOL  #3) 300-30 MG tablet Take 1 tablet by mouth 4 (four) times  daily as needed for pain 02/25/24     albuterol  (VENTOLIN  HFA) 108 (90 Base) MCG/ACT inhaler Inhale 2 puffs into the lungs every 4 (four) hours as needed for wheezing or shortness of breath. 02/02/24   Johnie Flaming A, NP  albuterol  (VENTOLIN  HFA) 108 (90 Base) MCG/ACT inhaler Inhale 2 puffs into the lungs every 6 (six) hours as needed for wheezing or shortness of breath. 02/19/24   Donnelly Mellow, MD  ARIPiprazole  (ABILIFY ) 5 MG tablet Take 1 tablet (5 mg total) by mouth daily. 02/15/24   Towana Leita SAILOR, MD  ARIPiprazole  ER (ABILIFY  MAINTENA) 400 MG PRSY prefilled syringe Inject 400 mg into the muscle every 28 (twenty-eight) days. 02/19/24   Donnelly Mellow, MD  clindamycin  (CLEOCIN ) 300 MG capsule Take 1 capsule (300 mg total) by mouth 4 (four) times daily for 7 days 02/25/24     divalproex  (DEPAKOTE  ER) 250 MG 24 hr tablet Take 1 tablet (250 mg total) by mouth daily. 02/23/24   Medina-Vargas, Monina C, NP  ibuprofen  (ADVIL ) 600 MG tablet Take 1 tablet (600 mg total) by mouth every 6 (six) hours as needed for pain 02/25/24     lidocaine  (LIDODERM ) 5 % Place 1 patch onto the skin daily. Remove & Discard patch within 12 hours or as directed by MD 02/19/24   Donnelly Mellow, MD  metFORMIN  (GLUCOPHAGE -XR) 750 MG 24 hr tablet Take 1 tablet (750 mg total) by mouth daily with breakfast. 02/23/24   Medina-Vargas, Monina C, NP  thiamine  (VITAMIN B-1) 100 MG tablet Take 1 tablet (100 mg total) by  mouth daily. 02/15/24   Towana Leita SAILOR, MD  traZODone  (DESYREL ) 50 MG tablet Take 1 tablet (50 mg total) by mouth at bedtime. 02/19/24   Donnelly Mellow, MD    Allergies: Iron  dextran, Aspirin , and Penicillins    Review of Systems  Unable to perform ROS: Mental status change    Updated Vital Signs BP 130/71   Temp 98.9 F (37.2 C)   Resp 20   LMP 01/08/2011   SpO2 98%   Physical Exam Vitals and nursing note reviewed.  Constitutional:      General: She is not in acute distress.    Appearance: She is  well-developed.     Comments: Sleeping, easily arousable and when she was aroused she hold her abdomen and states she does not feel well.  Patient is moving all 4 extremities.  HENT:     Head: Atraumatic.  Eyes:     Extraocular Movements: Extraocular movements intact.     Conjunctiva/sclera: Conjunctivae normal.  Cardiovascular:     Rate and Rhythm: Normal rate and regular rhythm.     Pulses: Normal pulses.     Heart sounds: Normal heart sounds.  Pulmonary:     Effort: Pulmonary effort is normal.  Abdominal:     Palpations: Abdomen is soft.     Tenderness: There is abdominal tenderness (Diffuse tenderness no guarding no rebound tenderness).  Musculoskeletal:     Cervical back: Normal range of motion and neck supple. No rigidity.  Skin:    Findings: No rash.  Neurological:     Mental Status: She is alert. She is disoriented.  Psychiatric:        Mood and Affect: Mood normal.        Speech: Speech is slurred.        Behavior: Behavior is withdrawn.        Thought Content: Thought content is delusional.     (all labs ordered are listed, but only abnormal results are displayed) Labs Reviewed  COMPREHENSIVE METABOLIC PANEL WITH GFR - Abnormal; Notable for the following components:      Result Value   Sodium 133 (*)    CO2 21 (*)    Glucose, Bld 109 (*)    Calcium  7.7 (*)    Albumin  3.1 (*)    Total Bilirubin 1.5 (*)    All other components within normal limits  RAPID URINE DRUG SCREEN, HOSP PERFORMED - Abnormal; Notable for the following components:   Cocaine POSITIVE (*)    All other components within normal limits  CBC WITH DIFFERENTIAL/PLATELET - Abnormal; Notable for the following components:   Hemoglobin 8.9 (*)    HCT 29.2 (*)    MCV 72.5 (*)    MCH 22.1 (*)    RDW 18.5 (*)    Neutro Abs 8.3 (*)    All other components within normal limits  ACETAMINOPHEN  LEVEL - Abnormal; Notable for the following components:   Acetaminophen  (Tylenol ), Serum <10 (*)    All other  components within normal limits  SALICYLATE LEVEL - Abnormal; Notable for the following components:   Salicylate Lvl <7.0 (*)    All other components within normal limits  VALPROIC  ACID LEVEL - Abnormal; Notable for the following components:   Valproic  Acid Lvl <10 (*)    All other components within normal limits  ETHANOL    EKG: None ED ECG REPORT   Date: 03/28/2024  Rate: 124  Rhythm: sinus tachycardia  QRS Axis: normal  Intervals: normal  ST/T  Wave abnormalities: normal  Conduction Disutrbances:none  Narrative Interpretation: tachycardia  Old EKG Reviewed: changes noted  I have personally reviewed the EKG tracing and agree with the computerized printout as noted.   Radiology: No results found.   Procedures   Medications Ordered in the ED  risperiDONE  (RISPERDAL  M-TABS) disintegrating tablet 2 mg (has no administration in time range)    And  LORazepam  (ATIVAN ) tablet 1 mg (has no administration in time range)    And  ziprasidone  (GEODON ) injection 20 mg (has no administration in time range)  acetaminophen  (TYLENOL ) tablet 650 mg (650 mg Oral Given 03/28/24 1948)  zolpidem  (AMBIEN ) tablet 5 mg (has no administration in time range)  ondansetron  (ZOFRAN ) tablet 4 mg (4 mg Oral Given 03/28/24 1948)  alum & mag hydroxide-simeth (MAALOX/MYLANTA) 200-200-20 MG/5ML suspension 30 mL (has no administration in time range)  nicotine  (NICODERM CQ  - dosed in mg/24 hours) patch 21 mg (21 mg Transdermal Patient Refused/Not Given 03/28/24 2024)                                    Medical Decision Making Amount and/or Complexity of Data Reviewed Labs: ordered. Radiology: ordered.  Risk OTC drugs. Prescription drug management.   BP 130/71   Temp 98.9 F (37.2 C)   Resp 20   LMP 01/08/2011   SpO2 98%   70:38 PM 58 year old female history of schizoaffective disorder, COPD, prior suicidal attempt, possible substance use, diabetes, borderline personality disorder, brought  here via EMS accompanied by daughter with concerns of suicidal attempt.  Per daughter who is one of our Child psychotherapist, patient was supposed to go to her neurology appointment today but daughter was unable to reach out to her until later when patient called her but appears altered on the phone.  Daughter voiced concern and contacted EMS.  Daughter states that she went over to the house and noticed multiple drug paraphernalia around the house as well as multiple pills scattered around the ashtrays and on the table.  She worries that her mother may have had a suicidal attempt causing confusion.  Daughter states patient appears to be a lot more confused than her baseline.  Daughter mention patient has history of drug overdose in the past.  She also has history of polysubstance use and appears to be using crack recently.  She also drinks alcohol .  Daughter mention patient has been depressed for the past several years after the death of her husband.  Patient was also recently had inpatient psychiatric hospitalization several weeks ago.  History is limited from patient as I was unable to obtain any cohesive story.  Patient did states I do not feel good she also complained of abdominal discomfort.  On exam she appears altered but in new acute discomfort.  She is moving all 4 extremities equally, she is following direction.  Her vital signs are overall reassuring.  -Labs ordered, independently viewed and interpreted by me.  Labs remarkable for labs remarkable for UDS positive for cocaine.  Negative alcohol , normal Tylenol , salicylate level and hemoglobin is 8.9 slightly decreased from prior but she has had history of anemia in the past.  She is subtherapeutic with a Depakote  level -The patient was maintained on a cardiac monitor.  I personally viewed and interpreted the cardiac monitored which showed an underlying rhythm of: NSR -Imaging including head CT scan considered but no report of any head injury or seizure  activity. -This patient presents to the ED for concern of altered mental status, this involves an extensive number of treatment options, and is a complaint that carries with it a high risk of complications and morbidity.  The differential diagnosis includes suicidal attempt, drug-induced psychosis, depression, anxiety, diabetes, metabolic derangement -Co morbidities that complicate the patient evaluation includes substance use, diabetes, borderline personality disorder -Treatment includes supportive care -Reevaluation of the patient after these medicines showed that the patient improved -PCP office notes or outside notes reviewed -Discussion with TTS and psychiatry for psych eval.  Pt sign out to oncoming provider who will f/u on head CT and abd/pelvis CT.  If neg pt is medically cleared -Escalation to admission/observation considered: patient to be evaluated by psychiatry.   Pt does endorse headache and abd pain.  No focal neuro deficit on exam.  Will obtain head CT and abd/pelvis CT to r/o medical emergency    Final diagnoses:  Transient alteration of awareness  Cocaine use    ED Discharge Orders     None          Nivia Colon, PA-C 03/28/24 2314    Patsey Lot, MD 03/29/24 1320

## 2024-03-28 NOTE — ED Notes (Signed)
 [facility] paperwork attached to the clipboard in blue and original in the red folder on coming secretary is aware

## 2024-03-28 NOTE — ED Notes (Signed)
 IVC PAPERWORK SENT THRU E-FILE TO [facility] AND IT HAS BEEN ACCEPTED  ENV.# [id number] WAITING FOR [employer] TO BRING THE FINDINGS AND CUSTODY TO COMPLETE THE  PROCESS

## 2024-03-28 NOTE — ED Notes (Signed)
 Patient continuously removing monitoring cords. Patient getting out of bed and wandering around nurses station. Patient reoriented and returned to bed. Monitoring leads replaced.

## 2024-03-28 NOTE — ED Notes (Signed)
 Pt POA at nurse's station and requesting update and wanting to speak with EDP. ED PA-C notified of patient POA arrival to bedside.

## 2024-03-29 ENCOUNTER — Emergency Department (HOSPITAL_COMMUNITY)

## 2024-03-29 DIAGNOSIS — F259 Schizoaffective disorder, unspecified: Secondary | ICD-10-CM

## 2024-03-29 DIAGNOSIS — F1010 Alcohol abuse, uncomplicated: Secondary | ICD-10-CM

## 2024-03-29 DIAGNOSIS — R404 Transient alteration of awareness: Secondary | ICD-10-CM

## 2024-03-29 DIAGNOSIS — F1490 Cocaine use, unspecified, uncomplicated: Secondary | ICD-10-CM

## 2024-03-29 DIAGNOSIS — F1410 Cocaine abuse, uncomplicated: Secondary | ICD-10-CM

## 2024-03-29 MED ORDER — DIVALPROEX SODIUM ER 250 MG PO TB24
250.0000 mg | ORAL_TABLET | Freq: Every day | ORAL | Status: DC
  Administered 2024-03-29 – 2024-03-30 (×2): 250 mg via ORAL
  Filled 2024-03-29 (×2): qty 1

## 2024-03-29 MED ORDER — IOHEXOL 350 MG/ML SOLN
75.0000 mL | Freq: Once | INTRAVENOUS | Status: AC | PRN
  Administered 2024-03-29: 75 mL via INTRAVENOUS

## 2024-03-29 MED ORDER — LEVETIRACETAM ER 500 MG PO TB24
500.0000 mg | ORAL_TABLET | Freq: Every day | ORAL | Status: DC
  Administered 2024-03-29 – 2024-03-30 (×2): 500 mg via ORAL
  Filled 2024-03-29 (×2): qty 1

## 2024-03-29 NOTE — ED Provider Notes (Signed)
 Patient is alert, oriented, ambulating around the department, laughing and talking with different staff members.  She does not appear altered anymore.  Workup is reassuring.  Appears medically clear for TTS evaluation.   Vicky Charleston, PA-C 03/29/24 0435    Theadore Ozell HERO, MD 03/29/24 [phone]

## 2024-03-29 NOTE — ED Notes (Signed)
 Called [facility]. They will accept pt tomorrow 03/30/2024 After 0900. Address for [facility] is [address], [place]. [place]

## 2024-03-29 NOTE — Progress Notes (Signed)
 LCSW Progress Note  [id number]   [name]  03/29/2024  1:56 PM  Description:   Inpatient Psychiatric Referral  Patient was recommended inpatient per Nancyann Alert (MD. There are no available beds at [facility], per [facility] (Danika Riley RN). Patient was referred to the following out of network facilities:   [facility] Provider Address Phone Fax  [facility]  [address], [place] [address] [phone]  [facility]  [address] [place] [address] [phone]  [facility]  [address] [place], [place] [address] [phone]  [facility]  [address] [place] [address] [phone]  [facility]  [address] [place] [address] [phone]  [facility]  [address] [place] [address] [phone]  [facility]  [address] [place] [address] [phone]  [facility] EFAX  [address] [place], [place] [phone]  [facility]  [address] [place] [address] [phone]  [facility]  [address] [place] [address] [phone]      Situation ongoing, CSW to continue following and update chart as more information becomes available.     [place] Danielle Lento, MSW, LCSW  03/29/2024 1:56 PM

## 2024-03-29 NOTE — ED Notes (Signed)
 IVC IS CURRENT

## 2024-03-29 NOTE — ED Notes (Signed)
Please update family

## 2024-03-29 NOTE — Consult Note (Signed)
 [place] [facility] Consult {CHL [facility] INITIAL OR FOLLOW LE:68184}  Patient Name: .[name]  MRN: [id number]  DOB: [birth year]  Consult Order details:  Orders (From admission, onward)     Start     Ordered   03/28/24 1737  CONSULT TO CALL ACT TEAM       Ordering Provider: Nivia Colon, PA-C  Provider:  (Not yet assigned)  Question:  Reason for Consult?  Answer:  Psych consult   03/28/24 1737             Mode of Visit: {Type of visit:31911}    Psychiatry Consult Evaluation  Service Date: March 29, 2024 LOS:  LOS: 0 days  Chief Complaint ***  Primary Psychiatric Diagnoses  *** 2.  *** 3.  ***  Assessment  Chrisanna Mishra Feng is a 58 y.o. female admitted: {CHL BH Medical or Presented to ZI:68182}qnm 03/28/2024  4:14 PM for ***. She carries the psychiatric diagnoses of *** and has a past medical history of  ***.   Her current presentation of *** is most consistent with ***. She meets criteria for *** based on ***.  Current outpatient psychotropic medications include *** and historically she has had a *** response to these medications. She was *** compliant with medications prior to admission as evidenced by ***. On initial examination, patient ***. Please see plan below for detailed recommendations.   Diagnoses:  Active Hospital problems: Active Problems:   Cocaine abuse (HCC)   Alcohol  abuse   Cocaine use   Transient alteration of awareness    Plan   ## Psychiatric Medication Recommendations:  ***  ## Medical Decision Making Capacity: {CHL BH MEDICAL DECISION MAKING CAPACITY:31818}  ## Further Work-up:  -- *** {CHLmacgeneralandspecificworkuprecs:31821} -- most recent EKG on *** had QtC of *** -- Pertinent labwork reviewed earlier this admission includes: ***   ## Disposition:-- {CHLmaccldispo:31820}  ## Behavioral / Environmental: -{CHLmacbehavioralenvironmental2:31847}    ## Safety and Observation Level:  - Based on my clinical evaluation, I  estimate the patient to be at *** risk of self harm in the current setting. - At this time, we recommend  {CHL BH SUICIDE OBSERVATION LEVEL:31850}. This decision is based on my review of the chart including patient's history and current presentation, interview of the patient, mental status examination, and consideration of suicide risk including evaluating suicidal ideation, plan, intent, suicidal or self-harm behaviors, risk factors, and protective factors. This judgment is based on our ability to directly address suicide risk, implement suicide prevention strategies, and develop a safety plan while the patient is in the clinical setting. Please contact our team if there is a concern that risk level has changed.  CSSR Risk Category:C-SSRS RISK CATEGORY: No Risk  Suicide Risk Assessment: Patient has following modifiable risk factors for suicide: {CHLmacmodifiablesuicideriskfactors:31822}, which we are addressing by ***. Patient has following non-modifiable or demographic risk factors for suicide: {CHLmacnonmodifiablesuicideriskfactors:31823} Patient has the following protective factors against suicide: {CHLmacprotectivefactors:31824}  Thank you for this consult request. Recommendations have been communicated to the primary team.  We will *** at this time.   Elveria VEAR Batter, NP       History of Present Illness  Relevant Aspects of [facility] or ED course:31819} Course:  Admitted on 03/28/2024 for ***. They ***.   Patient Report:  ***  Psych ROS:  Depression: *** Anxiety:  *** Mania (lifetime and current): *** Psychosis: (lifetime and current): ***  Collateral information:  Contacted *** at *** on ***  ROS   Psychiatric and Social History  Psychiatric History:  Information collected from ***  Prev Dx/Sx: *** Current Psych Provider: *** Home Meds (current): *** Previous Med Trials: *** Therapy: ***  Prior Psych Hospitalization: ***  Prior Self Harm: *** Prior  Violence: ***  Family Psych History: *** Family Hx suicide: ***  Social History:  Developmental Hx: *** Educational Hx: *** Occupational Hx: *** Legal Hx: *** Living Situation: *** Spiritual Hx: *** Access to weapons/lethal means: ***   Substance History Alcohol : ***  Type of alcohol  *** Last Drink *** Number of drinks per day *** History of alcohol  withdrawal seizures *** History of DT's *** Tobacco: *** Illicit drugs: *** Prescription drug abuse: *** Rehab hx: ***  Exam Findings  Physical Exam: *** Vital Signs:  Temp:  [97.6 F (36.4 C)-98.9 F (37.2 C)] 97.6 F (36.4 C) (10/07 0500) Pulse Rate:  [93-124] 93 (10/07 0500) Resp:  [18-20] 18 (10/07 0500) BP: (100-134)/(64-81) 100/66 (10/07 0500) SpO2:  [95 %-100 %] 100 % (10/07 0500) Blood pressure 100/66, pulse 93, temperature 97.6 F (36.4 C), temperature source Oral, resp. rate 18, last menstrual period 01/08/2011, SpO2 100%. There is no height or weight on file to calculate BMI.  Physical Exam  Mental Status Exam: General Appearance: {Appearance:22683}  Orientation:  {BHH ORIENTATION (PAA):22689}  Memory:  {BHH MEMORY:22881}  Concentration:  {Concentration:21399}  Recall:  {BHH GOOD/FAIR/POOR:22877}  Attention  {BH Attention Span:31825}  Eye Contact:  {BHH EYE CONTACT:22684}  Speech:  {Speech:22685}  Language:  {BHH GOOD/FAIR/POOR:22877}  Volume:  {Volume (PAA):22686}  Mood: ***  Affect:  {Affect (PAA):22687}  Thought Process:  {Thought Process (PAA):22688}  Thought Content:  {Thought Content:22690}  Suicidal Thoughts:  {ST/HT (PAA):22692}  Homicidal Thoughts:  {ST/HT (PAA):22692}  Judgement:  {Judgement (PAA):22694}  Insight:  {Insight (PAA):22695}  Psychomotor Activity:  {Psychomotor (PAA):22696}  Akathisia:  {BHH YES OR NO:22294}  Fund of Knowledge:  {BHH GOOD/FAIR/POOR:22877}      Assets:  {Assets (PAA):22698}  Cognition:  {chl bhh cognition:304700322}  ADL's:  {BHH JIO'D:77709}  AIMS (if  indicated):        Other History   These have been pulled in through the EMR, reviewed, and updated if appropriate.  Family History:  The patient's family history includes COPD in an other family member; Diabetes in her mother; Heart attack in her father and sister; Heart disease in her mother; Hypertension in her mother.  Medical History: Past Medical History:  Diagnosis Date   Abdominal pain    Accidental drug overdose April 2013   Anxiety    Atrial fibrillation (HCC) 09/29/11   converted spontaneously   Chronic back pain    Chronic knee pain    Chronic nausea    Chronic pain    COPD (chronic obstructive pulmonary disease) (HCC)    Depression    Diabetes mellitus    states her doctor took her off all DM meds in past month   Diabetic neuropathy (HCC)    Dyspnea    with exertion    GERD (gastroesophageal reflux disease)    Headache(784.0)    migraines    HTN (hypertension)    not on meds since in a year    Hyperlipidemia    Hypothyroidism    not on meds in a while    Mental disorder    Bipolar and schizophrenic   Nausea and vomiting 01/02/2023   Requires supplemental oxygen     as needed per patient    Schizophrenia (HCC)  Schizophrenia, acute (HCC) 11/13/2017   Tobacco abuse     Surgical History: Past Surgical History:  Procedure Laterality Date   ABDOMINAL HYSTERECTOMY     BLADDER SUSPENSION  03/04/2011   Procedure: [facility] PROCEDURE;  Surgeon: Glendia LABOR MacDiarmid;  Location: [facility];  Service: Urology;  Laterality: N/A;   BOWEL RESECTION N/A 04/18/2022   Procedure: SMALL BOWEL RESECTION;  Surgeon: Vanderbilt Ned, MD;  Location: [facility] OR;  Service: General;  Laterality: N/A;   CYSTOCELE REPAIR  03/04/2011   Procedure: ANTERIOR REPAIR (CYSTOCELE);  Surgeon: Glendia LABOR Elizabeth;  Location: [facility];  Service: Urology;  Laterality: N/A;   CYSTOSCOPY  03/04/2011   Procedure: CYSTOSCOPY;  Surgeon: Glendia LABOR MacDiarmid;  Location: [facility];  Service: Urology;  Laterality: N/A;    ESOPHAGOGASTRODUODENOSCOPY (EGD) WITH PROPOFOL  N/A 05/12/2017   Procedure: ESOPHAGOGASTRODUODENOSCOPY (EGD) WITH PROPOFOL ;  Surgeon: Ethyl Lenis, MD;  Location: [facility];  Service: General;  Laterality: N/A;   GASTRIC ROUX-EN-Y N/A 03/25/2016   Procedure: LAPAROSCOPIC ROUX-EN-Y GASTRIC BYPASS WITH UPPER ENDOSCOPY;  Surgeon: Morene Olives, MD;  Location: [facility];  Service: General;  Laterality: N/A;   KNEE SURGERY     LAPAROSCOPIC ASSISTED VAGINAL HYSTERECTOMY  03/04/2011   Procedure: LAPAROSCOPIC ASSISTED VAGINAL HYSTERECTOMY;  Surgeon: Rosaline LITTIE Cobble, MD;  Location: [facility];  Service: Gynecology;  Laterality: N/A;   LAPAROTOMY N/A 04/18/2022   Procedure: EXPLORATORY LAPAROTOMY;  Surgeon: Vanderbilt Ned, MD;  Location: [facility];  Service: General;  Laterality: N/A;   LAPAROTOMY N/A 04/24/2022   Procedure: BRING BACK EXPLORATORY LAPAROTOMY;  Surgeon: Sebastian Moles, MD;  Location: [facility] OR;  Service: General;  Laterality: N/A;   TOTAL HIP ARTHROPLASTY Right 08/27/2022   Procedure: TOTAL HIP ARTHROPLASTY;  Surgeon: Edna Toribio LABOR, MD;  Location: [facility];  Service: Orthopedics;  Laterality: Right;     Medications:   Current Facility-Administered Medications:    acetaminophen  (TYLENOL ) tablet 650 mg, 650 mg, Oral, Q4H PRN, Tran, Bowie, PA-C, 650 mg at 03/29/24 0231   alum & mag hydroxide-simeth (MAALOX/MYLANTA) 200-200-20 MG/5ML suspension 30 mL, 30 mL, Oral, Q6H PRN, Tran, Bowie, PA-C   risperiDONE  (RISPERDAL  M-TABS) disintegrating tablet 2 mg, 2 mg, Oral, Q8H PRN **AND** LORazepam  (ATIVAN ) tablet 1 mg, 1 mg, Oral, PRN **AND** ziprasidone  (GEODON ) injection 20 mg, 20 mg, Intramuscular, PRN, Tran, Bowie, PA-C   nicotine  (NICODERM CQ  - dosed in mg/24 hours) patch 21 mg, 21 mg, Transdermal, Daily, Tran, Bowie, PA-C, 21 mg at 03/29/24 1307   ondansetron  (ZOFRAN ) tablet 4 mg, 4 mg, Oral, Q8H PRN, Tran, Bowie, PA-C, 4 mg at 03/28/24 1948   zolpidem  (AMBIEN ) tablet 5 mg, 5 mg, Oral, QHS PRN,  Tran, Bowie, PA-C  Current Outpatient Medications:    albuterol  (VENTOLIN  HFA) 108 (90 Base) MCG/ACT inhaler, Inhale 2 puffs into the lungs every 4 (four) hours as needed for wheezing or shortness of breath., Disp: 18 g, Rfl: 1   divalproex  (DEPAKOTE  ER) 250 MG 24 hr tablet, Take 1 tablet (250 mg total) by mouth daily., Disp: 90 tablet, Rfl: 1   ibuprofen  (ADVIL ) 600 MG tablet, Take 1 tablet (600 mg total) by mouth every 6 (six) hours as needed for pain, Disp: 20 tablet, Rfl: 0   levETIRAcetam  (KEPPRA  XR) 500 MG 24 hr tablet, Take 500 mg by mouth in the morning and at bedtime., Disp: , Rfl:    metFORMIN  (GLUCOPHAGE -XR) 750 MG 24 hr tablet, Take 1 tablet (750 mg total) by mouth daily with breakfast., Disp: 90 tablet, Rfl: 1  OXYGEN , Inhale 4-5 L into the lungs See admin instructions. Use every night. May use throughout the day if needed., Disp: , Rfl:   Allergies: Allergies  Allergen Reactions   Iron  Dextran Shortness Of Breath and Anxiety   Aspirin  Nausea And Vomiting and Other (See Comments)    Ok to take tylenol  or ibuprofen     Penicillins Other (See Comments)    Unknown reaction from childhood: family would like this to remain as an allergy.  Tolerated cephalosporins and zosyn     Elveria VEAR Batter, NP

## 2024-03-29 NOTE — Progress Notes (Signed)
 Pt has been accepted to [facility] on 03/29/2024  Bed assignment: MEADOWS UNIT   Pt meets inpatient criteria per: Nancyann Alert MD  Attending Physician will be: Dr. Sherrill MD  Report can be called to:[phone]   Pt can arrive ASAP   Care Team Notified: Elveria Batter NP,   [place] Zulay Corrie LCSW-A   03/29/2024 3:20 PM

## 2024-03-29 NOTE — ED Provider Notes (Signed)
 Emergency Medicine Observation Re-evaluation Note  LATAJA NEWLAND is a 58 y.o. female, seen on rounds today.  Pt initially presented to the ED for complaints of Altered Mental Status Currently, the patient is watching TV.  Physical Exam  BP 100/66 (BP Location: Left Arm)   Pulse 93   Temp 97.6 F (36.4 C) (Oral)   Resp 18   LMP 01/08/2011   SpO2 100%  Physical Exam General: No acute distress Cardiac: Well-perfused Lungs: Nonlabored Psych: Calm  ED Course / MDM  EKG:EKG Interpretation Date/Time:  Monday March 28 2024 18:00:59 EDT Ventricular Rate:  124 PR Interval:  132 QRS Duration:  78 QT Interval:  310 QTC Calculation: 445 R Axis:   59  Text Interpretation: Sinus tachycardia Otherwise normal ECG When compared with ECG of 12-Feb-2024 14:15, HEART RATE has increased Confirmed by Raford Lenis ([id number]) on 03/29/2024 3:41:57 AM  I have reviewed the labs performed to date as well as medications administered while in observation.  Recent changes in the last 24 hours include psychiatry evaluation.  Plan  Current plan is for inpatient psychiatric treatment.    Towana Ozell BROCKS, MD 03/29/24 [phone]

## 2024-03-29 NOTE — ED Notes (Signed)
 Patient dressed out into paper scrubs. Patient belonging inventoried and bagged. Placed in locker 14 in purple zone. Patient wanded by security and moved to yellow zone room 38

## 2024-03-30 DIAGNOSIS — R404 Transient alteration of awareness: Secondary | ICD-10-CM | POA: Diagnosis not present

## 2024-03-30 LAB — TROPONIN I (HIGH SENSITIVITY): Troponin I (High Sensitivity): 4 ng/L (ref <18)

## 2024-03-30 NOTE — ED Notes (Signed)
 Attempted to call [facility], no answer at this time.

## 2024-03-30 NOTE — ED Notes (Signed)
 Report to nurse Supervisor at [facility].  Transportation called.

## 2024-03-30 NOTE — ED Notes (Signed)
 Attempted to call report to [facility], no answer at this time.  No way to leave message.

## 2024-03-30 NOTE — ED Notes (Signed)
 IVC date 03/28/24, exp 04/04/24

## 2024-03-30 NOTE — ED Provider Notes (Signed)
 Emergency Medicine Observation Re-evaluation Note  [name] is a 58 y.o. female, seen on rounds today.  Pt initially presented to the ED for complaints of Altered Mental Status Currently, the patient is watching TV this running, seems to be in a happy mood and has no complaints.  Physical Exam  BP 117/74   Pulse 91   Temp 98.3 F (36.8 C)   Resp 18   LMP 01/08/2011   SpO2 100%  Physical Exam General: No acute distress, well-appearing Cardiac: Regular Lungs: Clear Psych: Calm cooperative and interactive  ED Course / MDM  EKG:EKG Interpretation Date/Time:  Monday March 28 2024 18:00:59 EDT Ventricular Rate:  124 PR Interval:  132 QRS Duration:  78 QT Interval:  310 QTC Calculation: 445 R Axis:   59  Text Interpretation: Sinus tachycardia Otherwise normal ECG When compared with ECG of 12-Feb-2024 14:15, HEART RATE has increased Confirmed by Raford Lenis ([id number]) on 03/29/2024 3:41:57 AM  I have reviewed the labs performed to date as well as medications administered while in observation.  Recent changes in the last 24 hours include none.  Plan  Current plan is for transport to inpatient psychiatric facility today.    Doretha Folks, MD 03/30/24 [phone]

## 2024-04-16 NOTE — Progress Notes (Deleted)
 Psychiatric Initial Adult Assessment  Patient Identification: [name] MRN:  [id number] Date of Evaluation:  04/16/2024 Referral Source: [name], NP  Assessment:  [name] is a 58 y.o. female with a history of MDD, schizoaffective disorder, polysubstance abuse that includes AUD, cocaine, with prior psychiatric admissions***who presents to [facility] for initial evaluation of ***.  Patient reports *** PMHx significant for COPD, T2DM, seizures on Keppra  The patient's presentation is most consistent with a principal diagnosis of ***, as evidenced by ***.   Relevant Chart Review Notes/Encounters: *** Labs/Imaging: ***   Risk Assessment: A suicide and violence risk assessment was performed as part of this evaluation. There patient is deemed to be at chronic elevated risk for self-harm/suicide given the following factors: {SABSUICIDERISKFACTORS:29780}. These risk factors are mitigated by the following factors: {SABSUICIDEPROTECTIVEFACTORS:29779}. The patient is deemed to be at chronic elevated risk for violence given the following factors: {SABVIOLENCERISKFACTORS:29781}. These risk factors are mitigated by the following factors: {SABVIOLENCEPROTECTIVEFACTORS:29782}. There is no *** acute risk for suicide or violence at this time. The patient was educated about relevant modifiable risk factors including following recommendations for treatment of psychiatric illness and abstaining from substance abuse.  While future psychiatric events cannot be accurately predicted, the patient does not *** currently require  acute inpatient psychiatric care and does not *** currently meet   involuntary commitment criteria.    Plan:  # *** Past medication trials:  Status of problem: new to me Interventions: -- ***  # *** Past medication trials:  Status of problem: new to me Interventions: -- ***  # *** Past medication trials:  Status of  problem: new to me Interventions: -- ***  Health Maintenance PCP: [name], NP @ [facility]   Patient was given contact information for behavioral health clinic and was instructed to call 911 for emergencies.  Patient and plan of care will be discussed with the Attending MD ,Dr. ***, who agrees with the above statement and plan.   Subjective:  Chief Complaint: No chief complaint on file.   History of Present Illness:   *** The patient is here for ***. The patient reports a *** history of ***.  The patient's stressors include ***.   Sleep: *** Snoring: *** Caffeine : ***   Psychiatric ROS Depression: *** The patient denied any current symptoms of depression/***The patient reports a *** history of low mood and anhedonia, characterized by {Depression:32915}. Suicidal thoughts are ***. The patient denies any symptoms of ***.   Anxiety: *** The patient denied any current symptoms of anxiety/ ***The patient reports a *** history of excessive anxiety and worry about a number of events, characterized by {HJI:67083}.   Panic attacks: *** The patient denies any current or past history of panic attacks/*** The patient reports experiencing of intense surge of fear, in which the following symptoms develop: {Panic Attacks:32917}  Mania/Hypomania: *** Negative for any past or current symptoms of expansive energy or mood/ The patient reports a *** history of episodes of expansive energy and mood that can last up to *** days characterized by {MANIA:32922}    Last episode reported by patient: ***   PTSD: *** Negative for any history of trauma / ***The patient reports past exposure to ***.  Intrusions s/xs: ***  Hyperarousal s/xs: ***  Avoidance s/xs: ***  Negative effects on cognition and mood: ***  Eating Disorder: The patient denies any current or past history of disordered eating that includes restrictive, binging, purging behaviors, or  any other compensatory behaviors /  ***   IED: ***/ The patient reports a ** history of impulsive aggression characterized by {PZI:67078}  Psychosis: *** The patient denies any history of hallucinations, disorganized thinking, paranoia, or delusions. / ***    Safety: Active SI: ***Denied Passive SI: *** Access to firearms: *** Psychosis: as above  ROS   Past Psychiatric History:  Diagnoses: *** Medication trials: *** Previous psychiatrist/therapist: *** Hospitalizations: *** Suicide attempts: *** NSSIB Hx: *** Hx of violence towards others: *** Hx of trauma/abuse: ***  Substance Abuse History: Alcohol :  Hx of withdrawal seizures/Dts: *** Tobacco: *** Cannabis: *** Other Illicit Substance Use: IVDU: denied Detox Hx: *** Rehab Hx: ***  Past Medical History: PCP: Medina-Vargas, Monina C, NP  Dx: *** ALL: *** Iron  dextran, Aspirin , and Penicillins  Head trauma: *** Seizures: ***   Family Psychiatric History: *** Psychiatric disorders: *** Suicide hx: *** Homicide: ***  Social History: Living situation: *** Occupational status: *** Educational history: *** Marital Status: *** Children: *** Legal Hx: *** DUI/DWI: *** Military Hx: *** Developmental Hx: School Performance: Upbringing/Relationship with parents: Major Family stressors:  Past Medical History:  Past Medical History:  Diagnosis Date   Abdominal pain    Accidental drug overdose April 2013   Anxiety    Atrial fibrillation (HCC) 09/29/11   converted spontaneously   Chronic back pain    Chronic knee pain    Chronic nausea    Chronic pain    COPD (chronic obstructive pulmonary disease) (HCC)    Depression    Diabetes mellitus    states her doctor took her off all DM meds in past month   Diabetic neuropathy (HCC)    Dyspnea    with exertion    GERD (gastroesophageal reflux disease)    Headache(784.0)    migraines    HTN (hypertension)    not on meds since in a year    Hyperlipidemia    Hypothyroidism    not on meds  in a while    Mental disorder    Bipolar and schizophrenic   Nausea and vomiting 01/02/2023   Requires supplemental oxygen     as needed per patient    Schizophrenia (HCC)    Schizophrenia, acute (HCC) 11/13/2017   Tobacco abuse     Past Surgical History:  Procedure Laterality Date   ABDOMINAL HYSTERECTOMY     BLADDER SUSPENSION  03/04/2011   Procedure: [facility] PROCEDURE;  Surgeon: Glendia LABOR MacDiarmid;  Location: [facility];  Service: Urology;  Laterality: N/A;   BOWEL RESECTION N/A 04/18/2022   Procedure: SMALL BOWEL RESECTION;  Surgeon: Vanderbilt Ned, MD;  Location: [facility];  Service: General;  Laterality: N/A;   CYSTOCELE REPAIR  03/04/2011   Procedure: ANTERIOR REPAIR (CYSTOCELE);  Surgeon: Glendia LABOR Elizabeth;  Location: [facility];  Service: Urology;  Laterality: N/A;   CYSTOSCOPY  03/04/2011   Procedure: CYSTOSCOPY;  Surgeon: Glendia LABOR MacDiarmid;  Location: [facility];  Service: Urology;  Laterality: N/A;   ESOPHAGOGASTRODUODENOSCOPY (EGD) WITH PROPOFOL  N/A 05/12/2017   Procedure: ESOPHAGOGASTRODUODENOSCOPY (EGD) WITH PROPOFOL ;  Surgeon: Ethyl Lenis, MD;  Location: [facility];  Service: General;  Laterality: N/A;   GASTRIC ROUX-EN-Y N/A 03/25/2016   Procedure: LAPAROSCOPIC ROUX-EN-Y GASTRIC BYPASS WITH UPPER ENDOSCOPY;  Surgeon: Morene Olives, MD;  Location: [facility];  Service: General;  Laterality: N/A;   KNEE SURGERY     LAPAROSCOPIC ASSISTED VAGINAL HYSTERECTOMY  03/04/2011   Procedure: LAPAROSCOPIC ASSISTED VAGINAL HYSTERECTOMY;  Surgeon: Rosaline LITTIE Cobble, MD;  Location: [facility];  Service: Gynecology;  Laterality: N/A;   LAPAROTOMY N/A 04/18/2022   Procedure: EXPLORATORY LAPAROTOMY;  Surgeon: Vanderbilt Ned, MD;  Location: [facility];  Service: General;  Laterality: N/A;   LAPAROTOMY N/A 04/24/2022   Procedure: BRING BACK EXPLORATORY LAPAROTOMY;  Surgeon: Sebastian Moles, MD;  Location: [facility];  Service: General;  Laterality: N/A;   TOTAL HIP ARTHROPLASTY Right 08/27/2022   Procedure: TOTAL HIP  ARTHROPLASTY;  Surgeon: Edna Toribio LABOR, MD;  Location: [facility];  Service: Orthopedics;  Laterality: Right;    Family History:  Family History  Problem Relation Age of Onset   Diabetes Mother    Heart disease Mother    Hypertension Mother    Heart attack Father        63s   Heart attack Sister        67   COPD Other    Breast cancer Neg Hx     Social History:   Social History   Socioeconomic History   Marital status: Widowed    Spouse name: Not on file   Number of children: 3   Years of education: Not on file   Highest education level: GED or equivalent  Occupational History   Not on file  Tobacco Use   Smoking status: Every Day    Current packs/day: 1.00    Types: Cigarettes   Smokeless tobacco: Never  Vaping Use   Vaping status: Never Used  Substance and Sexual Activity   Alcohol  use: Yes    Comment: 80 ounces daily   Drug use: Yes    Types: Cocaine, Marijuana, Crack cocaine    Comment: last use- 03/19/2023   Sexual activity: Not Currently    Partners: Male  Other Topics Concern   Not on file  Social History Narrative   Not on file   Social Drivers of Health   Financial Resource Strain: Low Risk  (02/05/2024)   Overall Financial Resource Strain (CARDIA)    Difficulty of Paying Living Expenses: Not hard at all  Food Insecurity: No Food Insecurity (02/14/2024)   Hunger Vital Sign    Worried About Running Out of Food in the Last Year: Never true    Ran Out of Food in the Last Year: Never true  Transportation Needs: No Transportation Needs (02/14/2024)   PRAPARE - Administrator, Civil Service (Medical): No    Lack of Transportation (Non-Medical): No  Physical Activity: Inactive (02/05/2024)   Exercise Vital Sign    Days of Exercise per Week: 0 days    Minutes of Exercise per Session: Not on file  Stress: Stress Concern Present (02/05/2024)   [employer] of Occupational Health - Occupational Stress Questionnaire    Feeling of Stress:  Very much  Social Connections: Socially Isolated (02/05/2024)   Social Connection and Isolation Panel    Frequency of Communication with Friends and Family: More than three times a week    Frequency of Social Gatherings with Friends and Family: More than three times a week    Attends Religious Services: Never    Database Administrator or Organizations: No    Attends Banker Meetings: Not on file    Marital Status: Widowed    Allergies:   Allergies  Allergen Reactions   Iron  Dextran Shortness Of Breath and Anxiety   Aspirin  Nausea And Vomiting and Other (See Comments)    Ok to take tylenol  or ibuprofen     Penicillins Other (See Comments)  Unknown reaction from childhood: family would like this to remain as an allergy.  Tolerated cephalosporins and zosyn     Current Medications: Current Outpatient Medications  Medication Sig Dispense Refill   albuterol  (VENTOLIN  HFA) 108 (90 Base) MCG/ACT inhaler Inhale 2 puffs into the lungs every 4 (four) hours as needed for wheezing or shortness of breath. 18 g 1   divalproex  (DEPAKOTE  ER) 250 MG 24 hr tablet Take 1 tablet (250 mg total) by mouth daily. 90 tablet 1   ibuprofen  (ADVIL ) 600 MG tablet Take 1 tablet (600 mg total) by mouth every 6 (six) hours as needed for pain 20 tablet 0   levETIRAcetam  (KEPPRA  XR) 500 MG 24 hr tablet Take 500 mg by mouth in the morning and at bedtime.     metFORMIN  (GLUCOPHAGE -XR) 750 MG 24 hr tablet Take 1 tablet (750 mg total) by mouth daily with breakfast. 90 tablet 1   OXYGEN  Inhale 4-5 L into the lungs See admin instructions. Use every night. May use throughout the day if needed.     No current facility-administered medications for this visit.     Objective: Psychiatric Specialty Exam: General Appearance: Casual, fairly groomed  Eye Contact:  Good    Speech:  Clear, coherent, normal rate, spontaneous  Volume:  Normal   Mood:  see above  Affect:  Appropriate, congruent, full range   Thought Content: Logical, rumination  ***  Suicidal Thoughts: see subjective  Thought Process:  Coherent, goal-directed, circumstantial ***  Orientation:  A&Ox4   Memory:  Immediate good  Judgment:  Fair   Insight:  Fair***  Concentration:  Attention and concentration good   Recall:  Good  Fund of Knowledge: Good  Language: Good, fluent  Psychomotor Activity: Normal  Akathisia:  NA   AIMS (if indicated): NA   Assets:   {Assets (PAA):22698}  ADL's:  Intact  Cognition: WNL  Sleep: see above  Appetite: see above    Physical Exam    Metabolic Disorder Labs: Lab Results  Component Value Date   HGBA1C 7.4 (H) 02/05/2024   MPG 166 02/05/2024   MPG 139.85 11/05/2023   Lab Results  Component Value Date   PROLACTIN 32.8 (H) 11/15/2017   Lab Results  Component Value Date   CHOL 184 02/05/2024   TRIG 70 02/05/2024   HDL 110 02/05/2024   CHOLHDL 1.7 02/05/2024   VLDL 8 11/05/2023   LDLCALC 59 02/05/2024   LDLCALC 45 11/05/2023   Lab Results  Component Value Date   TSH 1.23 02/05/2024    Therapeutic Level Labs: No results found for: LITHIUM No results found for: CBMZ Lab Results  Component Value Date   VALPROATE <10 (L) 03/28/2024     Marlo Masson, MD 10/25/20251:26 PM

## 2024-04-18 ENCOUNTER — Ambulatory Visit (HOSPITAL_COMMUNITY): Admitting: Student in an Organized Health Care Education/Training Program

## 2024-04-20 ENCOUNTER — Ambulatory Visit: Admitting: Internal Medicine

## 2024-04-20 NOTE — Progress Notes (Deleted)
 [name], female    DOB: [birth year]   MRN: [id number]   Brief patient profile:  24 yobf   *** referred to pulmonary clinic 04/20/2024 by *** for ***      Pt not previously seen by PCCM service.    History of Present Illness  04/20/2024  Pulmonary/ 1st office eval/Fatin Bachicha  No chief complaint on file.    Dyspnea:  *** Cough: *** Sleep: *** SABA use: *** 02 use:*** LDSCT:***  No obvious day to day or daytime pattern/variability or assoc excess/ purulent sputum or mucus plugs or hemoptysis or cp or chest tightness, subjective wheeze or overt sinus or hb symptoms.    Also denies any obvious fluctuation of symptoms with weather or environmental changes or other aggravating or alleviating factors except as outlined above   No unusual exposure hx or h/o childhood pna/ asthma or knowledge of premature birth.  Current Allergies, Complete Past Medical History, Past Surgical History, Family History, and Social History were reviewed in [employer] record.  ROS  The following are not active complaints unless bolded Hoarseness, sore throat, dysphagia, dental problems, itching, sneezing,  nasal congestion or discharge of excess mucus or purulent secretions, ear ache,   fever, chills, sweats, unintended wt loss or wt gain, classically pleuritic or exertional cp,  orthopnea pnd or arm/hand swelling  or leg swelling, presyncope, palpitations, abdominal pain, anorexia, nausea, vomiting, diarrhea  or change in bowel habits or change in bladder habits, change in stools or change in urine, dysuria, hematuria,  rash, arthralgias, visual complaints, headache, numbness, weakness or ataxia or problems with walking or coordination,  change in mood or  memory.             Outpatient Medications Prior to Visit  Medication Sig Dispense Refill   albuterol  (VENTOLIN  HFA) 108 (90 Base) MCG/ACT inhaler Inhale 2 puffs into the lungs every 4 (four) hours as needed for wheezing or  shortness of breath. 18 g 1   divalproex  (DEPAKOTE  ER) 250 MG 24 hr tablet Take 1 tablet (250 mg total) by mouth daily. 90 tablet 1   ibuprofen  (ADVIL ) 600 MG tablet Take 1 tablet (600 mg total) by mouth every 6 (six) hours as needed for pain 20 tablet 0   levETIRAcetam  (KEPPRA  XR) 500 MG 24 hr tablet Take 500 mg by mouth in the morning and at bedtime.     metFORMIN  (GLUCOPHAGE -XR) 750 MG 24 hr tablet Take 1 tablet (750 mg total) by mouth daily with breakfast. 90 tablet 1   OXYGEN  Inhale 4-5 L into the lungs See admin instructions. Use every night. May use throughout the day if needed.     No facility-administered medications prior to visit.    Past Medical History:  Diagnosis Date   Abdominal pain    Accidental drug overdose April 2013   Anxiety    Atrial fibrillation (HCC) 09/29/11   converted spontaneously   Chronic back pain    Chronic knee pain    Chronic nausea    Chronic pain    COPD (chronic obstructive pulmonary disease) (HCC)    Depression    Diabetes mellitus    states her doctor took her off all DM meds in past month   Diabetic neuropathy (HCC)    Dyspnea    with exertion    GERD (gastroesophageal reflux disease)    Headache(784.0)    migraines    HTN (hypertension)    not on meds since in a year  Hyperlipidemia    Hypothyroidism    not on meds in a while    Mental disorder    Bipolar and schizophrenic   Nausea and vomiting 01/02/2023   Requires supplemental oxygen     as needed per patient    Schizophrenia (HCC)    Schizophrenia, acute (HCC) 11/13/2017   Tobacco abuse       Objective:     LMP 01/08/2011          Assessment

## 2024-04-23 ENCOUNTER — Other Ambulatory Visit: Payer: Self-pay | Admitting: Adult Health

## 2024-04-23 ENCOUNTER — Other Ambulatory Visit (HOSPITAL_COMMUNITY): Payer: Self-pay

## 2024-04-25 ENCOUNTER — Other Ambulatory Visit (HOSPITAL_COMMUNITY): Payer: Self-pay

## 2024-04-25 MED ORDER — ALBUTEROL SULFATE HFA 108 (90 BASE) MCG/ACT IN AERS
2.0000 | INHALATION_SPRAY | RESPIRATORY_TRACT | 1 refills | Status: DC | PRN
  Filled 2024-04-25: qty 18, 25d supply, fill #0

## 2024-05-20 ENCOUNTER — Other Ambulatory Visit (HOSPITAL_COMMUNITY): Payer: Self-pay

## 2024-05-21 ENCOUNTER — Emergency Department (HOSPITAL_COMMUNITY): Admission: EM | Admit: 2024-05-21 | Discharge: 2024-05-21 | Disposition: A | Discharge status: Home / Self Care

## 2024-05-21 ENCOUNTER — Encounter (HOSPITAL_COMMUNITY): Payer: Self-pay

## 2024-05-21 ENCOUNTER — Emergency Department (HOSPITAL_COMMUNITY)

## 2024-05-21 DIAGNOSIS — K2970 Gastritis, unspecified, without bleeding: Secondary | ICD-10-CM | POA: Insufficient documentation

## 2024-05-21 DIAGNOSIS — R1012 Left upper quadrant pain: Secondary | ICD-10-CM | POA: Diagnosis present

## 2024-05-21 DIAGNOSIS — J449 Chronic obstructive pulmonary disease, unspecified: Secondary | ICD-10-CM | POA: Diagnosis not present

## 2024-05-21 DIAGNOSIS — R110 Nausea: Secondary | ICD-10-CM

## 2024-05-21 LAB — HEPATIC FUNCTION PANEL
ALT: 23 U/L (ref 0–44)
AST: 43 U/L — ABNORMAL HIGH (ref 15–41)
Albumin: 3.5 g/dL (ref 3.5–5.0)
Alkaline Phosphatase: 78 U/L (ref 38–126)
Bilirubin, Direct: 0.2 mg/dL (ref 0.0–0.2)
Indirect Bilirubin: 0.5 mg/dL (ref 0.3–0.9)
Total Bilirubin: 0.7 mg/dL (ref 0.0–1.2)
Total Protein: 6.8 g/dL (ref 6.5–8.1)

## 2024-05-21 LAB — CBC WITH DIFFERENTIAL/PLATELET
Abs Immature Granulocytes: 0.01 K/uL (ref 0.00–0.07)
Basophils Absolute: 0 K/uL (ref 0.0–0.1)
Basophils Relative: 1 %
Eosinophils Absolute: 0 K/uL (ref 0.0–0.5)
Eosinophils Relative: 0 %
HCT: 31.7 % — ABNORMAL LOW (ref 36.0–46.0)
Hemoglobin: 9.7 g/dL — ABNORMAL LOW (ref 12.0–15.0)
Immature Granulocytes: 0 %
Lymphocytes Relative: 30 %
Lymphs Abs: 1.9 K/uL (ref 0.7–4.0)
MCH: 22.3 pg — ABNORMAL LOW (ref 26.0–34.0)
MCHC: 30.6 g/dL (ref 30.0–36.0)
MCV: 72.9 fL — ABNORMAL LOW (ref 80.0–100.0)
Monocytes Absolute: 0.5 K/uL (ref 0.1–1.0)
Monocytes Relative: 9 %
Neutro Abs: 3.8 K/uL (ref 1.7–7.7)
Neutrophils Relative %: 60 %
Platelets: 240 K/uL (ref 150–400)
RBC: 4.35 MIL/uL (ref 3.87–5.11)
RDW: 19.8 % — ABNORMAL HIGH (ref 11.5–15.5)
WBC: 6.2 K/uL (ref 4.0–10.5)
nRBC: 0 % (ref 0.0–0.2)

## 2024-05-21 LAB — BASIC METABOLIC PANEL WITH GFR
Anion gap: 11 (ref 5–15)
BUN: 11 mg/dL (ref 6–20)
CO2: 27 mmol/L (ref 22–32)
Calcium: 8.2 mg/dL — ABNORMAL LOW (ref 8.9–10.3)
Chloride: 98 mmol/L (ref 98–111)
Creatinine, Ser: 0.56 mg/dL (ref 0.44–1.00)
GFR, Estimated: >60 mL/min (ref >60)
Glucose, Bld: 115 mg/dL — ABNORMAL HIGH (ref 70–99)
Potassium: 4.5 mmol/L (ref 3.5–5.1)
Sodium: 136 mmol/L (ref 135–145)

## 2024-05-21 LAB — URINALYSIS, ROUTINE W REFLEX MICROSCOPIC
Bilirubin Urine: NEGATIVE
Glucose, UA: NEGATIVE mg/dL
Hgb urine dipstick: NEGATIVE
Ketones, ur: NEGATIVE mg/dL
Leukocytes,Ua: NEGATIVE
Nitrite: NEGATIVE
Protein, ur: NEGATIVE mg/dL
Specific Gravity, Urine: 1.015 (ref 1.005–1.030)
pH: 6 (ref 5.0–8.0)

## 2024-05-21 LAB — LIPASE, BLOOD: Lipase: 36 U/L (ref 11–51)

## 2024-05-21 MED ORDER — SUCRALFATE 1 G PO TABS: 1.0000 g | ORAL_TABLET | Freq: Three times a day (TID) | ORAL | 0 refills | Status: DC

## 2024-05-21 MED ORDER — ONDANSETRON HCL 4 MG PO TABS: 4.0000 mg | ORAL_TABLET | Freq: Three times a day (TID) | ORAL | 0 refills | Status: AC | PRN

## 2024-05-21 MED ORDER — FAMOTIDINE 40 MG PO TABS: 40.0000 mg | ORAL_TABLET | Freq: Every day | ORAL | 0 refills | Status: DC

## 2024-05-21 MED ORDER — ONDANSETRON HCL 4 MG/2ML IJ SOLN
4.0000 mg | Freq: Once | INTRAMUSCULAR | Status: AC
  Administered 2024-05-21: 4 mg via INTRAVENOUS
  Filled 2024-05-21: qty 2

## 2024-05-21 MED ORDER — ALUM & MAG HYDROXIDE-SIMETH 200-200-20 MG/5ML PO SUSP
30.0000 mL | Freq: Once | ORAL | Status: AC
  Administered 2024-05-21: 30 mL via ORAL
  Filled 2024-05-21: qty 30

## 2024-05-21 MED ORDER — LACTATED RINGERS IV BOLUS
500.0000 mL | Freq: Once | INTRAVENOUS | Status: AC
  Administered 2024-05-21: 500 mL via INTRAVENOUS

## 2024-05-21 MED ORDER — IOHEXOL 350 MG/ML SOLN
75.0000 mL | Freq: Once | INTRAVENOUS | Status: AC | PRN
  Administered 2024-05-21: 75 mL via INTRAVENOUS

## 2024-05-21 MED ORDER — LIDOCAINE VISCOUS HCL 2 % MT SOLN
15.0000 mL | Freq: Once | OROMUCOSAL | Status: AC
  Administered 2024-05-21: 15 mL via ORAL
  Filled 2024-05-21: qty 15

## 2024-05-21 NOTE — ED Notes (Signed)
 Patient transported to X-ray

## 2024-05-21 NOTE — ED Notes (Signed)
 Pt noted to be up and walking around hall bed.  Pt seems to be contemplating if she is going to stay.

## 2024-05-21 NOTE — ED Triage Notes (Addendum)
 Pt BIB [facility] for upper abd pain  x 3 days with NV x 2 days.  OTC Emetrol hasn't help. SOB with hx of COPD. Unproductive cough x 2 days, Lung sounds clear per EMS.  Per EMS Pt wears 4L [place] PRN.  Pt was on 2L with EMS for comfort.    Man 112/68, 106/52, 95% RA, CBG 164,, HR 100. 12-lead unremarkable.

## 2024-05-21 NOTE — ED Notes (Signed)
 Patient transported to CT

## 2024-05-21 NOTE — ED Notes (Signed)
 Unsuccessful IV attempt x2 will ask another RN to attempt.

## 2024-05-21 NOTE — Discharge Instructions (Signed)
 You can take your zofran  as needed for nausea and vomiting. You should take your carafate  and pepcid  as prescribed. Follow up with your primary care doctor 1-2 weeks.

## 2024-05-21 NOTE — ED Provider Notes (Signed)
 [place] EMERGENCY DEPARTMENT AT [facility] Provider Note   CSN: [id number] Arrival date & time: 05/21/24  9195     Patient presents with: Abdominal Pain and Nausea   [name] is a 58 y.o. female.   58 year old female presents for evaluation of abdominal pain and nausea.  She states this started a few days ago.  States over-the-counter medications are not helping.  States is left upper quadrant and radiates to the right upper quadrant.  She has a history of COPD but denies shortness of breath but has had an increase in coughing.  Denies any vomiting or constipation.  Denies any other symptoms or concerns.   Abdominal Pain Associated symptoms: nausea   Associated symptoms: no chest pain, no chills, no cough, no dysuria, no fever, no hematuria, no shortness of breath, no sore throat and no vomiting        Prior to Admission medications   Medication Sig Start Date End Date Taking? Authorizing Provider  famotidine  (PEPCID ) 40 MG tablet Take 1 tablet (40 mg total) by mouth daily. 05/21/24 06/20/24 Yes Williamson Cavanah L, DO  ondansetron  (ZOFRAN ) 4 MG tablet Take 1 tablet (4 mg total) by mouth every 8 (eight) hours as needed for up to 4 days. 05/21/24 05/25/24 Yes Peyten Weare L, DO  sucralfate  (CARAFATE ) 1 g tablet Take 1 tablet (1 g total) by mouth in the morning, at noon, and at bedtime for 14 days. 05/21/24 06/04/24 Yes Lathon Adan L, DO  albuterol  (VENTOLIN  HFA) 108 (90 Base) MCG/ACT inhaler Inhale 2 puffs into the lungs every 4 (four) hours as needed for wheezing or shortness of breath. 04/25/24   Medina-Vargas, Monina C, NP  divalproex  (DEPAKOTE  ER) 250 MG 24 hr tablet Take 1 tablet (250 mg total) by mouth daily. 02/23/24   Medina-Vargas, Monina C, NP  ibuprofen  (ADVIL ) 600 MG tablet Take 1 tablet (600 mg total) by mouth every 6 (six) hours as needed for pain 02/25/24     levETIRAcetam  (KEPPRA  XR) 500 MG 24 hr tablet Take 500 mg by mouth in the morning and at  bedtime.    [provider]  metFORMIN  (GLUCOPHAGE -XR) 750 MG 24 hr tablet Take 1 tablet (750 mg total) by mouth daily with breakfast. 02/23/24   Medina-Vargas, Monina C, NP  OXYGEN  Inhale 4-5 L into the lungs See admin instructions. Use every night. May use throughout the day if needed.    [provider]    Allergies: Iron  dextran, Aspirin , and Penicillins    Review of Systems  Constitutional:  Negative for chills and fever.  HENT:  Negative for ear pain and sore throat.   Eyes:  Negative for pain and visual disturbance.  Respiratory:  Negative for cough and shortness of breath.   Cardiovascular:  Negative for chest pain and palpitations.  Gastrointestinal:  Positive for abdominal pain and nausea. Negative for vomiting.  Genitourinary:  Negative for dysuria and hematuria.  Musculoskeletal:  Negative for arthralgias and back pain.  Skin:  Negative for color change and rash.  Neurological:  Negative for seizures and syncope.  All other systems reviewed and are negative.   Updated Vital Signs BP 129/84   Pulse 88   Temp 98.1 F (36.7 C)   Resp 16   Ht 5' 1 (1.549 m)   Wt 56.7 kg   LMP 01/08/2011   SpO2 96%   BMI 23.62 kg/m   Physical Exam Vitals and nursing note reviewed.  Constitutional:  General: She is not in acute distress.    Appearance: She is well-developed. She is not ill-appearing.  HENT:     Head: Normocephalic and atraumatic.  Eyes:     Conjunctiva/sclera: Conjunctivae normal.  Cardiovascular:     Rate and Rhythm: Normal rate and regular rhythm.     Heart sounds: No murmur heard. Pulmonary:     Effort: Pulmonary effort is normal. No respiratory distress.     Breath sounds: Normal breath sounds.  Abdominal:     Palpations: Abdomen is soft.     Tenderness: There is abdominal tenderness in the right upper quadrant and left upper quadrant.  Musculoskeletal:        General: No swelling.     Cervical back: Neck supple.  Skin:     General: Skin is warm and dry.     Capillary Refill: Capillary refill takes less than 2 seconds.  Neurological:     Mental Status: She is alert.  Psychiatric:        Mood and Affect: Mood normal.     (all labs ordered are listed, but only abnormal results are displayed) Labs Reviewed  BASIC METABOLIC PANEL WITH GFR - Abnormal; Notable for the following components:      Result Value   Glucose, Bld 115 (*)    Calcium  8.2 (*)    All other components within normal limits  CBC WITH DIFFERENTIAL/PLATELET - Abnormal; Notable for the following components:   Hemoglobin 9.7 (*)    HCT 31.7 (*)    MCV 72.9 (*)    MCH 22.3 (*)    RDW 19.8 (*)    All other components within normal limits  HEPATIC FUNCTION PANEL - Abnormal; Notable for the following components:   AST 43 (*)    All other components within normal limits  LIPASE, BLOOD  URINALYSIS, ROUTINE W REFLEX MICROSCOPIC    EKG: EKG Interpretation Date/Time:  Saturday May 21 2024 10:41:31 EST Ventricular Rate:  89 PR Interval:  150 QRS Duration:  86 QT Interval:  354 QTC Calculation: 430 R Axis:   39  Text Interpretation: Normal sinus rhythm Compared with prior EKG from 03/28/2024 Confirmed by Gennaro Bouchard ([id number]) on 05/21/2024 10:45:30 AM  Radiology: CT ABDOMEN PELVIS W CONTRAST Result Date: 05/21/2024 CLINICAL DATA:  Upper abdominal pain for 3 days with nausea and vomiting for 2 days. EXAM: CT ABDOMEN AND PELVIS WITH CONTRAST TECHNIQUE: Multidetector CT imaging of the abdomen and pelvis was performed using the standard protocol following bolus administration of intravenous contrast. RADIATION DOSE REDUCTION: This exam was performed according to the departmental dose-optimization program which includes automated exposure control, adjustment of the mA and/or kV according to patient size and/or use of iterative reconstruction technique. CONTRAST:  75mL OMNIPAQUE  IOHEXOL  350 MG/ML SOLN COMPARISON:  03/29/2024 and older exams.  FINDINGS: Lower chest: Clear lung bases. Hepatobiliary: No focal liver abnormality is seen. No gallstones, gallbladder wall thickening, or biliary dilatation. Pancreas: Unremarkable. No pancreatic ductal dilatation or surrounding inflammatory changes. Spleen: Normal in size without focal abnormality. Adrenals/Urinary Tract: Normal adrenal glands. Normal kidneys and ureters. Bladder decompressed. Stomach/Bowel: Previous gastric bypass surgery. No evidence of bowel obstruction. No stomach or bowel wall thickening or inflammation. Vascular/Lymphatic: Aortic atherosclerosis. No aneurysm. No enlarged lymph nodes. Reproductive: Status post hysterectomy. No adnexal masses. Other: No hernia.  No ascites. Musculoskeletal: Chronic compression fracture of T9. No acute fracture. No bone lesion. 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 IMPRESSION: 1. No acute findings.  2. Previous Roux-en-Y gastric bypass, without evidence of bowel obstruction or inflammation and unchanged from the prior CT. 3. Aortic atherosclerosis. Electronically Signed   By: Alm Parkins M.D.   On: 05/21/2024 11:06   DG Chest 1 View Result Date: 05/21/2024 CLINICAL DATA:  Cough.  Shortness of breath. EXAM: CHEST  1 VIEW COMPARISON:  02/03/2024 FINDINGS: The heart size and mediastinal contours are within normal limits. Both lungs are clear. The visualized skeletal structures are unremarkable. IMPRESSION: No active disease. Electronically Signed   By: Norleen DELENA Kil M.D.   On: 05/21/2024 09:42     Procedures   Medications Ordered in the ED  ondansetron  (ZOFRAN ) injection 4 mg (4 mg Intravenous Given 05/21/24 0901)  lactated ringers  bolus 500 mL (0 mLs Intravenous Stopped 05/21/24 1012)  alum & mag hydroxide-simeth (MAALOX/MYLANTA) 200-200-20 MG/5ML suspension 30 mL (30 mLs Oral Given 05/21/24 0856)    And  lidocaine  (XYLOCAINE ) 2 % viscous mouth solution 15 mL (15 mLs Oral Given 05/21/24 0856)  iohexol  (OMNIPAQUE )  350 MG/ML injection 75 mL (75 mLs Intravenous Contrast Given 05/21/24 1028)                                    Medical Decision Making Patient here for ongoing nausea.  Negative workup.  Given IV fluids nausea meds and GI cocktail and feeling much better.  Will treat her for gastritis with Carafate  and Protonix .  Will also start her on Zofran  as needed.  Advise close follow-up with primary care doctor otherwise return to the ER for new or worsening symptoms.  She has comfortable being discharged home.  Problems Addressed: Gastritis without bleeding, unspecified chronicity, unspecified gastritis type: acute illness or injury Nausea: acute illness or injury  Amount and/or Complexity of Data Reviewed External Data Reviewed: notes.    Details: Prior ED records reviewed and patient was here 03/28/24 for confusion  Labs: ordered. Decision-making details documented in ED Course.    Details: Ordered and reviewed by me and unremarkable  Radiology: ordered and independent interpretation performed. Decision-making details documented in ED Course.    Details: Ordered and interpreted by me independently of radiology:  Chest x-ray: Shows no acute abnormality  CT abdomen pelvis: Shows no acute abnormality ECG/medicine tests: ordered and independent interpretation performed. Decision-making details documented in ED Course.    Details: EKG ordered and inter by me in the absence of cardiology shows sinus rhythm, no STEMI or significant change when compared to prior  Risk OTC drugs. Prescription drug management. Drug therapy requiring intensive monitoring for toxicity.     Final diagnoses:  Gastritis without bleeding, unspecified chronicity, unspecified gastritis type  Nausea    ED Discharge Orders          Ordered    ondansetron  (ZOFRAN ) 4 MG tablet  Every 8 hours PRN        05/21/24 1115    famotidine  (PEPCID ) 40 MG tablet  Daily        05/21/24 1115    sucralfate  (CARAFATE ) 1 g tablet  3  times daily        05/21/24 1115               Fleta Borgeson L, DO 05/21/24 1240

## 2024-05-23 ENCOUNTER — Emergency Department (HOSPITAL_COMMUNITY)
Admission: EM | Admit: 2024-05-23 | Discharge: 2024-05-24 | Disposition: A | Attending: Emergency Medicine | Admitting: Emergency Medicine

## 2024-05-23 ENCOUNTER — Encounter (HOSPITAL_COMMUNITY): Payer: Self-pay

## 2024-05-23 ENCOUNTER — Other Ambulatory Visit (HOSPITAL_COMMUNITY): Payer: Self-pay

## 2024-05-23 ENCOUNTER — Other Ambulatory Visit: Payer: Self-pay | Admitting: Adult Health

## 2024-05-23 ENCOUNTER — Other Ambulatory Visit: Payer: Self-pay

## 2024-05-23 DIAGNOSIS — E871 Hypo-osmolality and hyponatremia: Secondary | ICD-10-CM | POA: Insufficient documentation

## 2024-05-23 DIAGNOSIS — G40909 Epilepsy, unspecified, not intractable, without status epilepticus: Secondary | ICD-10-CM

## 2024-05-23 DIAGNOSIS — E876 Hypokalemia: Secondary | ICD-10-CM | POA: Insufficient documentation

## 2024-05-23 DIAGNOSIS — M549 Dorsalgia, unspecified: Secondary | ICD-10-CM | POA: Insufficient documentation

## 2024-05-23 MED ORDER — LEVETIRACETAM 250 MG PO TABS
250.0000 mg | ORAL_TABLET | Freq: Two times a day (BID) | ORAL | 3 refills | Status: DC
  Filled 2024-05-23: qty 180, 90d supply, fill #0

## 2024-05-23 NOTE — Telephone Encounter (Signed)
 High risk or very high risk warning populated when attempting to refill medication. RX request sent to PCP for review and approval if warranted.

## 2024-05-23 NOTE — ED Provider Triage Note (Signed)
 Emergency Medicine Provider Triage Evaluation Note  [name] , a 58 y.o. female  was evaluated in triage.  Pt complains of back pain. Endorse lower abd pain and back pain throughout the day today.  Ran out of her muscle relaxant.  No fever, chills, dysuria, blood in urine, bowel/bladder incontinence  Review of Systems  Positive: As above Negative: As above  Physical Exam  BP (!) 140/72 (BP Location: Left Arm)   Pulse (!) 120   Temp 97.7 F (36.5 C) (Oral)   Resp 18   Ht 5' 1 (1.549 m)   Wt 56.7 kg   LMP 01/08/2011   SpO2 92%   BMI 23.62 kg/m  Gen:   Awake, no distress   Resp:  Normal effort  MSK:   Moves extremities without difficulty  Other:    Medical Decision Making  Medically screening exam initiated at 9:03 PM.  Appropriate orders placed.  Edia L Rynders was informed that the remainder of the evaluation will be completed by another provider, this initial triage assessment does not replace that evaluation, and the importance of remaining in the ED until their evaluation is complete.     Nivia Colon, PA-C 05/23/24 2106

## 2024-05-23 NOTE — ED Notes (Signed)
 PT cleared to go to lobby by provider

## 2024-05-23 NOTE — Telephone Encounter (Signed)
 Closing encounter. Patient was contacted by provider

## 2024-05-23 NOTE — Telephone Encounter (Signed)
 Called patient and was able to talk with daughter regarding Keppra  which is now ordered as 250 mg BID. Sent eRx to pharmacy.

## 2024-05-23 NOTE — ED Triage Notes (Signed)
 Pt arrived from home via [facility] c/o chronic lower back pain 8/10. Pt states that she is out of her muscle relaxers.

## 2024-05-24 ENCOUNTER — Emergency Department (HOSPITAL_COMMUNITY)

## 2024-05-24 ENCOUNTER — Emergency Department (HOSPITAL_COMMUNITY)
Admission: EM | Admit: 2024-05-24 | Discharge: 2024-05-24 | Discharge status: Against Medical Advice | Attending: Emergency Medicine | Admitting: Emergency Medicine

## 2024-05-24 ENCOUNTER — Telehealth: Payer: Self-pay | Admitting: Family Medicine

## 2024-05-24 DIAGNOSIS — J449 Chronic obstructive pulmonary disease, unspecified: Secondary | ICD-10-CM | POA: Insufficient documentation

## 2024-05-24 DIAGNOSIS — Z79899 Other long term (current) drug therapy: Secondary | ICD-10-CM | POA: Insufficient documentation

## 2024-05-24 DIAGNOSIS — R000 Tachycardia, unspecified: Secondary | ICD-10-CM | POA: Insufficient documentation

## 2024-05-24 DIAGNOSIS — R7401 Elevation of levels of liver transaminase levels: Secondary | ICD-10-CM | POA: Insufficient documentation

## 2024-05-24 DIAGNOSIS — R1012 Left upper quadrant pain: Secondary | ICD-10-CM | POA: Insufficient documentation

## 2024-05-24 DIAGNOSIS — Z7951 Long term (current) use of inhaled steroids: Secondary | ICD-10-CM | POA: Insufficient documentation

## 2024-05-24 DIAGNOSIS — R112 Nausea with vomiting, unspecified: Secondary | ICD-10-CM | POA: Insufficient documentation

## 2024-05-24 DIAGNOSIS — R1084 Generalized abdominal pain: Secondary | ICD-10-CM | POA: Insufficient documentation

## 2024-05-24 DIAGNOSIS — M549 Dorsalgia, unspecified: Secondary | ICD-10-CM | POA: Diagnosis not present

## 2024-05-24 DIAGNOSIS — Z7984 Long term (current) use of oral hypoglycemic drugs: Secondary | ICD-10-CM | POA: Insufficient documentation

## 2024-05-24 DIAGNOSIS — E119 Type 2 diabetes mellitus without complications: Secondary | ICD-10-CM | POA: Insufficient documentation

## 2024-05-24 LAB — URINALYSIS, W/ REFLEX TO CULTURE (INFECTION SUSPECTED)
Bacteria, UA: NONE SEEN
Bilirubin Urine: NEGATIVE
Glucose, UA: NEGATIVE mg/dL
Hgb urine dipstick: NEGATIVE
Ketones, ur: NEGATIVE mg/dL
Leukocytes,Ua: NEGATIVE
Nitrite: NEGATIVE
Protein, ur: NEGATIVE mg/dL
Specific Gravity, Urine: 1.004 — ABNORMAL LOW (ref 1.005–1.030)
pH: 6 (ref 5.0–8.0)

## 2024-05-24 LAB — COMPREHENSIVE METABOLIC PANEL WITH GFR
ALT: 29 U/L (ref 0–44)
ALT: 27 U/L (ref 0–44)
AST: 42 U/L — ABNORMAL HIGH (ref 15–41)
AST: 60 U/L — ABNORMAL HIGH (ref 15–41)
Albumin: 3.7 g/dL (ref 3.5–5.0)
Albumin: 4.3 g/dL (ref 3.5–5.0)
Alkaline Phosphatase: 86 U/L (ref 38–126)
Alkaline Phosphatase: 112 U/L (ref 38–126)
Anion gap: 13 (ref 5–15)
Anion gap: 14 (ref 5–15)
BUN: 5 mg/dL — ABNORMAL LOW (ref 6–20)
BUN: <5 mg/dL — ABNORMAL LOW (ref 6–20)
CO2: 25 mmol/L (ref 22–32)
CO2: 26 mmol/L (ref 22–32)
Calcium: 8.5 mg/dL — ABNORMAL LOW (ref 8.9–10.3)
Calcium: 8.6 mg/dL — ABNORMAL LOW (ref 8.9–10.3)
Chloride: 95 mmol/L — ABNORMAL LOW (ref 98–111)
Chloride: 94 mmol/L — ABNORMAL LOW (ref 98–111)
Creatinine, Ser: 0.72 mg/dL (ref 0.44–1.00)
Creatinine, Ser: 0.53 mg/dL (ref 0.44–1.00)
GFR, Estimated: >60 mL/min (ref >60)
GFR, Estimated: >60 mL/min (ref >60)
Glucose, Bld: 179 mg/dL — ABNORMAL HIGH (ref 70–99)
Glucose, Bld: 158 mg/dL — ABNORMAL HIGH (ref 70–99)
Potassium: 3.3 mmol/L — ABNORMAL LOW (ref 3.5–5.1)
Potassium: 3.4 mmol/L — ABNORMAL LOW (ref 3.5–5.1)
Sodium: 133 mmol/L — ABNORMAL LOW (ref 135–145)
Sodium: 134 mmol/L — ABNORMAL LOW (ref 135–145)
Total Bilirubin: 2.1 mg/dL — ABNORMAL HIGH (ref 0.0–1.2)
Total Bilirubin: 2.4 mg/dL — ABNORMAL HIGH (ref 0.0–1.2)
Total Protein: 7.2 g/dL (ref 6.5–8.1)
Total Protein: 7.4 g/dL (ref 6.5–8.1)

## 2024-05-24 LAB — CBC WITH DIFFERENTIAL/PLATELET
Abs Immature Granulocytes: 0.03 K/uL (ref 0.00–0.07)
Abs Immature Granulocytes: 0.04 K/uL (ref 0.00–0.07)
Basophils Absolute: 0 K/uL (ref 0.0–0.1)
Basophils Absolute: 0 K/uL (ref 0.0–0.1)
Basophils Relative: 0 %
Basophils Relative: 0 %
Eosinophils Absolute: 0 K/uL (ref 0.0–0.5)
Eosinophils Absolute: 0 K/uL (ref 0.0–0.5)
Eosinophils Relative: 0 %
Eosinophils Relative: 0 %
HCT: 32.5 % — ABNORMAL LOW (ref 36.0–46.0)
HCT: 31.9 % — ABNORMAL LOW (ref 36.0–46.0)
Hemoglobin: 9.9 g/dL — ABNORMAL LOW (ref 12.0–15.0)
Hemoglobin: 9.9 g/dL — ABNORMAL LOW (ref 12.0–15.0)
Immature Granulocytes: 0 %
Immature Granulocytes: 0 %
Lymphocytes Relative: 9 %
Lymphocytes Relative: 13 %
Lymphs Abs: 0.9 K/uL (ref 0.7–4.0)
Lymphs Abs: 1.2 K/uL (ref 0.7–4.0)
MCH: 21.9 pg — ABNORMAL LOW (ref 26.0–34.0)
MCH: 22.3 pg — ABNORMAL LOW (ref 26.0–34.0)
MCHC: 30.5 g/dL (ref 30.0–36.0)
MCHC: 31 g/dL (ref 30.0–36.0)
MCV: 71.9 fL — ABNORMAL LOW (ref 80.0–100.0)
MCV: 72 fL — ABNORMAL LOW (ref 80.0–100.0)
Monocytes Absolute: 0.7 K/uL (ref 0.1–1.0)
Monocytes Absolute: 0.7 K/uL (ref 0.1–1.0)
Monocytes Relative: 7 %
Monocytes Relative: 7 %
Neutro Abs: 8.3 K/uL — ABNORMAL HIGH (ref 1.7–7.7)
Neutro Abs: 8 K/uL — ABNORMAL HIGH (ref 1.7–7.7)
Neutrophils Relative %: 84 %
Neutrophils Relative %: 80 %
Platelets: 216 K/uL (ref 150–400)
Platelets: 226 K/uL (ref 150–400)
RBC: 4.52 MIL/uL (ref 3.87–5.11)
RBC: 4.43 MIL/uL (ref 3.87–5.11)
RDW: 19.8 % — ABNORMAL HIGH (ref 11.5–15.5)
RDW: 19.9 % — ABNORMAL HIGH (ref 11.5–15.5)
WBC: 9.9 K/uL (ref 4.0–10.5)
WBC: 10 K/uL (ref 4.0–10.5)
nRBC: 0 % (ref 0.0–0.2)
nRBC: 0 % (ref 0.0–0.2)

## 2024-05-24 LAB — I-STAT CG4 LACTIC ACID, ED
Lactic Acid, Venous: 2.2 mmol/L (ref 0.5–1.9)
Lactic Acid, Venous: 2 mmol/L (ref 0.5–1.9)
Lactic Acid, Venous: 0.8 mmol/L (ref 0.5–1.9)

## 2024-05-24 LAB — LIPASE, BLOOD
Lipase: 22 U/L (ref 11–51)
Lipase: 11 U/L (ref 11–51)

## 2024-05-24 MED ORDER — ACETAMINOPHEN 500 MG PO TABS
1000.0000 mg | ORAL_TABLET | Freq: Once | ORAL | Status: AC
  Administered 2024-05-24: 1000 mg via ORAL
  Filled 2024-05-24: qty 2

## 2024-05-24 MED ORDER — DICYCLOMINE HCL 10 MG PO CAPS
10.0000 mg | ORAL_CAPSULE | Freq: Once | ORAL | Status: AC
  Administered 2024-05-24: 10 mg via ORAL
  Filled 2024-05-24: qty 1

## 2024-05-24 MED ORDER — DIAZEPAM 5 MG/ML IJ SOLN
5.0000 mg | Freq: Once | INTRAMUSCULAR | Status: AC
  Administered 2024-05-24: 5 mg via INTRAVENOUS
  Filled 2024-05-24: qty 2

## 2024-05-24 MED ORDER — IOHEXOL 300 MG/ML  SOLN
80.0000 mL | Freq: Once | INTRAMUSCULAR | Status: AC | PRN
  Administered 2024-05-24: 80 mL via INTRAVENOUS

## 2024-05-24 MED ORDER — LACTATED RINGERS IV BOLUS: 1000.0000 mL | Freq: Once | INTRAVENOUS | Status: DC

## 2024-05-24 MED ORDER — ONDANSETRON HCL 4 MG/2ML IJ SOLN
4.0000 mg | Freq: Once | INTRAMUSCULAR | Status: AC
  Administered 2024-05-24: 4 mg via INTRAVENOUS
  Filled 2024-05-24: qty 2

## 2024-05-24 MED ORDER — LACTATED RINGERS IV BOLUS
1000.0000 mL | Freq: Once | INTRAVENOUS | Status: AC
  Administered 2024-05-24: 1000 mL via INTRAVENOUS

## 2024-05-24 NOTE — ED Notes (Signed)
 Patient was unable to give urine sample. Per Dr. Countryman, patient does not have to give a urine sample.

## 2024-05-24 NOTE — ED Triage Notes (Signed)
 Pt brought by EMS complaining of abdominal pain with N/V. As per pt it all started last night.

## 2024-05-24 NOTE — ED Provider Notes (Signed)
 [place] EMERGENCY DEPARTMENT AT [facility] Provider Note   CSN: [id number] Arrival date & time: 05/23/24  2030     History Chief Complaint  Patient presents with   Back Pain    HPI [name] is a 58 y.o. female presenting for abdominal pain, nausea vomiting, back pain, fatigue.  58 year old female well-known to the department frequently evaluated.  States that she has been having ongoing abdominal pain intermittently.  Pain radiates to her right flank and right upper quadrant.  Denies fevers chills syncope shortness of breath.  Intermittent bleeding nauseous though currently okay.  Patient's recorded medical, surgical, social, medication list and allergies were reviewed in the Snapshot window as part of the initial history.   Review of Systems   Review of Systems  Constitutional:  Negative for chills and fever.  HENT:  Negative for ear pain and sore throat.   Eyes:  Negative for pain and visual disturbance.  Respiratory:  Negative for cough and shortness of breath.   Cardiovascular:  Negative for chest pain and palpitations.  Gastrointestinal:  Negative for abdominal pain and vomiting.  Genitourinary:  Positive for flank pain. Negative for dysuria and hematuria.  Musculoskeletal:  Negative for arthralgias and back pain.  Skin:  Negative for color change and rash.  Neurological:  Negative for seizures and syncope.  All other systems reviewed and are negative.   Physical Exam Updated Vital Signs BP (!) 137/91 (BP Location: Right Arm)   Pulse 98   Temp 97.7 F (36.5 C)   Resp 19   Ht 5' 1 (1.549 m)   Wt 56.7 kg   LMP 01/08/2011   SpO2 99%   BMI 23.62 kg/m  Physical Exam Constitutional:      General: She is not in acute distress.    Appearance: She is not ill-appearing or toxic-appearing.  HENT:     Head: Normocephalic and atraumatic.  Eyes:     Extraocular Movements: Extraocular movements intact.     Pupils: Pupils are equal, round, and reactive  to light.  Cardiovascular:     Rate and Rhythm: Normal rate.  Pulmonary:     Effort: No respiratory distress.  Abdominal:     General: Abdomen is flat.     Tenderness: There is abdominal tenderness. There is no guarding.  Musculoskeletal:        General: No swelling, deformity or signs of injury.     Cervical back: Normal range of motion. No rigidity.  Skin:    General: Skin is warm and dry.  Neurological:     General: No focal deficit present.     Mental Status: She is alert and oriented to person, place, and time.  Psychiatric:        Mood and Affect: Mood normal.      ED Course/ Medical Decision Making/ A&P    Procedures Procedures   Medications Ordered in ED Medications  lactated ringers  bolus 1,000 mL (1,000 mLs Intravenous Not Given 05/24/24 0244)  diazepam  (VALIUM ) injection 5 mg (5 mg Intravenous Given 05/24/24 0115)  acetaminophen  (TYLENOL ) tablet 1,000 mg (1,000 mg Oral Given 05/24/24 0107)  Medical Decision Making:   [name] is a 58 y.o. female who presented to the ED today with right sided flank pain, detailed above.    Patient placed on continuous vitals and telemetry monitoring while in ED which was reviewed periodically.  Complete initial physical exam performed, notably the patient  was HDS in NAD.  Reviewed and confirmed nursing documentation for past medical history, family history, social history.    Initial Assessment:   With the patient's presentation of flank pain, most likely diagnosis is Urologic pathology (including UL vs UTI vs pyelo) vs MSK etiology. Other diagnoses were considered including (but not limited to) gastroenteritis, colitis, small bowel obstruction, appendicitis, cholecystitis, pancreatitis. These are considered less likely due to history of present illness and physical exam findings.   This is most consistent with an acute life/limb threatening illness complicated by underlying chronic conditions.   Initial Plan:  CBC/  Metabolic Panel  to evaluate for underlying infectious/metabolic etiology for patient's abdominal pain  Lipase to evaluate for pancreatitis  EKG to evaluate for cardiac source of pain  Urinalysis and repeat physical assessment to evaluate for UTI/Pyelonpehritis  Empiric management of symptoms with escalating pain control and antiemetics as needed.   Initial Study Results:   Laboratory  All laboratory results reviewed without evidence of clinically relevant pathology.    EKG EKG was reviewed independently. Rate, rhythm, axis, intervals all examined and without medically relevant abnormality. ST segments without concerns for elevations.    Radiology All images reviewed independently. Agree with radiology report at this time.   CT ABDOMEN PELVIS W CONTRAST Result Date: 05/21/2024 CLINICAL DATA:  Upper abdominal pain for 3 days with nausea and vomiting for 2 days. EXAM: CT ABDOMEN AND PELVIS WITH CONTRAST TECHNIQUE: Multidetector CT imaging of the abdomen and pelvis was performed using the standard protocol following bolus administration of intravenous contrast. RADIATION DOSE REDUCTION: This exam was performed according to the departmental dose-optimization program which includes automated exposure control, adjustment of the mA and/or kV according to patient size and/or use of iterative reconstruction technique. CONTRAST:  75mL OMNIPAQUE  IOHEXOL  350 MG/ML SOLN COMPARISON:  03/29/2024 and older exams. FINDINGS: Lower chest: Clear lung bases. Hepatobiliary: No focal liver abnormality is seen. No gallstones, gallbladder wall thickening, or biliary dilatation. Pancreas: Unremarkable. No pancreatic ductal dilatation or surrounding inflammatory changes. Spleen: Normal in size without focal abnormality. Adrenals/Urinary Tract: Normal adrenal glands. Normal kidneys and ureters. Bladder decompressed. Stomach/Bowel: Previous gastric bypass surgery. No evidence of bowel obstruction. No stomach or bowel wall  thickening or inflammation. Vascular/Lymphatic: Aortic atherosclerosis. No aneurysm. No enlarged lymph nodes. Reproductive: Status post hysterectomy. No adnexal masses. Other: No hernia.  No ascites. Musculoskeletal: Chronic compression fracture of T9. No acute fracture. No bone lesion. 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 IMPRESSION: 1. No acute findings. 2. Previous Roux-en-Y gastric bypass, without evidence of bowel obstruction or inflammation and unchanged from the prior CT. 3. Aortic atherosclerosis. Electronically Signed   By: Alm Parkins M.D.   On: 05/21/2024 11:06   DG Chest 1 View Result Date: 05/21/2024 CLINICAL DATA:  Cough.  Shortness of breath. EXAM: CHEST  1 VIEW COMPARISON:  02/03/2024 FINDINGS: The heart size and mediastinal contours are within normal limits. Both lungs are clear. The visualized skeletal structures are unremarkable. IMPRESSION: No active disease. Electronically Signed   By: Norleen DELENA Kil M.D.   On: 05/21/2024 09:42    Final Reassessment and Plan:   Recent imaging for similar symptoms benign. Labwork stable. Symptoms improved. No evidence of acute infectious/metabolic crisis or other emergent pathology. Likely MSK. Notably, patient came out of her room multiple times during show to be discharged to soon as possible.  She understands limitations of her workup today but still wants to be discharged.  Ultimately no acute pathology immediately detected, recommended follow-up with  PCP within 48 hours.     Clinical Impression:  1. Back pain, unspecified back location, unspecified back pain laterality, unspecified chronicity      Discharge   Final Clinical Impression(s) / ED Diagnoses Final diagnoses:  Back pain, unspecified back location, unspecified back pain laterality, unspecified chronicity    Rx / DC Orders ED Discharge Orders     None         Jerral Meth, MD 05/24/24 [phone]

## 2024-05-24 NOTE — ED Provider Notes (Signed)
 [place] EMERGENCY DEPARTMENT AT [facility] Provider Note   CSN: [id number] Arrival date & time: 05/24/24  9383     History Chief Complaint  Patient presents with   Nausea   Emesis   Abdominal Pain    HPI: [name] is a 58 y.o. female with history of pertinent for COPD not on home oxygen , schizoaffective disorder, seizure disorder, polysubstance use disorder, T2DM, borderline personality disorder who presents complaining of nausea, vomiting, abdominal pain. Patient arrived via EMS.  History provided by patient.  No interpreter required during this encounter.  Patient reports that she was here last night for similar abdominal pain.  Reports that after she left she developed nausea, vomiting.  Denies any diarrhea.  Denies fever, chills, chest pain, shortness of breath.  Reports that she believes that she may have caught something while she was in the ED last night and would like to be reevaluated.  Patient's recorded medical, surgical, social, medication list and allergies were reviewed in the Snapshot window as part of the initial history.   Prior to Admission medications   Medication Sig Start Date End Date Taking? Authorizing Provider  albuterol  (VENTOLIN  HFA) 108 (90 Base) MCG/ACT inhaler Inhale 2 puffs into the lungs every 4 (four) hours as needed for wheezing or shortness of breath. 04/25/24   Medina-Vargas, Monina C, NP  divalproex  (DEPAKOTE  ER) 250 MG 24 hr tablet Take 1 tablet (250 mg total) by mouth daily. 02/23/24   Medina-Vargas, Monina C, NP  famotidine  (PEPCID ) 40 MG tablet Take 1 tablet (40 mg total) by mouth daily. 05/21/24 06/20/24  Kammerer, Duwaine LITTIE, DO  ibuprofen  (ADVIL ) 600 MG tablet Take 1 tablet (600 mg total) by mouth every 6 (six) hours as needed for pain 02/25/24     levETIRAcetam  (KEPPRA ) 250 MG tablet Take 1 tablet (250 mg total) by mouth 2 (two) times daily. 05/23/24   Medina-Vargas, Monina C, NP  metFORMIN  (GLUCOPHAGE -XR) 750 MG 24 hr tablet  Take 1 tablet (750 mg total) by mouth daily with breakfast. 02/23/24   Medina-Vargas, Monina C, NP  ondansetron  (ZOFRAN ) 4 MG tablet Take 1 tablet (4 mg total) by mouth every 8 (eight) hours as needed for up to 4 days. 05/21/24 05/25/24  Kammerer, Duwaine L, DO  OXYGEN  Inhale 4-5 L into the lungs See admin instructions. Use every night. May use throughout the day if needed.    [provider]  sucralfate  (CARAFATE ) 1 g tablet Take 1 tablet (1 g total) by mouth in the morning, at noon, and at bedtime for 14 days. 05/21/24 06/04/24  Kammerer, Duwaine L, DO     Allergies: Iron  dextran, Aspirin , and Penicillins   Review of Systems   ROS as per HPI  Physical Exam Updated Vital Signs BP (!) 156/83 (BP Location: Left Arm)   Pulse (!) 109   Temp 98.7 F (37.1 C) (Oral)   Resp 18   Ht 5' 2 (1.575 m)   LMP 01/08/2011   SpO2 95%   BMI 22.86 kg/m  Physical Exam Vitals and nursing note reviewed.  Constitutional:      General: She is not in acute distress.    Appearance: She is well-developed.  HENT:     Head: Normocephalic and atraumatic.  Eyes:     Conjunctiva/sclera: Conjunctivae normal.  Cardiovascular:     Rate and Rhythm: Regular rhythm. Tachycardia present.     Heart sounds: No murmur heard. Pulmonary:     Effort: Pulmonary effort is normal.  No respiratory distress.     Breath sounds: Normal breath sounds.  Abdominal:     Palpations: Abdomen is soft.     Tenderness: There is generalized abdominal tenderness (Mild diffuse). There is left CVA tenderness (Mild). There is no right CVA tenderness.  Musculoskeletal:        General: No swelling.     Cervical back: Neck supple.  Skin:    General: Skin is warm and dry.     Capillary Refill: Capillary refill takes less than 2 seconds.  Neurological:     Mental Status: She is alert.  Psychiatric:        Mood and Affect: Mood normal.     ED Course/ Medical Decision Making/ A&P  Procedures Procedures   Medications Ordered in  ED Medications  lactated ringers  bolus 1,000 mL (0 mLs Intravenous Stopped 05/24/24 1105)  ondansetron  (ZOFRAN ) injection 4 mg (4 mg Intravenous Given 05/24/24 0920)  dicyclomine  (BENTYL ) capsule 10 mg (10 mg Oral Given 05/24/24 0922)  iohexol  (OMNIPAQUE ) 300 MG/ML solution 80 mL (80 mLs Intravenous Contrast Given 05/24/24 0822)  lactated ringers  bolus 1,000 mL (0 mLs Intravenous Stopped 05/24/24 1237)    Medical Decision Making:   [name] is a 58 y.o. female who presents for persistent abdominal pain with nausea vomiting as per above.  Physical exam is pertinent for diffuse abdominal tenderness as well as mild CVA TTP.  The differential includes but is not limited to dehydration, metabolic derangement, pancreatitis, nephrolithiasis, pyelonephritis, cystitis, bowel obstruction, gastroenteritis, gastritis, secondary gain.  Independent historian: None  External data reviewed: Labs: reviewed prior labs for baseline and Notes: Did review multiple prior ED notes including ED note from yesterday with patient complaining of similar presentation, though urine was not able to be collected last night, and patient did not undergo CT given reassuring labs and patient's desire to leave  Initial Plan:  Screening labs including CBC and Metabolic panel to evaluate for infectious or metabolic etiology of disease.  Screening lactic for sepsis/mesenteric ischemia Screening lipase for pancreatitis evaluation Urinalysis with reflex culture ordered to evaluate for UTI or relevant urologic/nephrologic pathology.  CT abdomen pelvis to evaluate for structural/infectious intra-abdominal pathology.  EKG to evaluate for cardiac pathology Objective evaluation as below reviewed   Labs: Ordered, Independent interpretation, and Details: Without UTI.  CBC without leukocytosis, thrombocytopenia.  Stable anemia. CMP without AKI, emergent electrolyte derangement, emergent electrolyte derangement.  Initial lactic acid  mildly elevated at 2.2 repeat improved at 2, further repeat normalized at 0.8  Radiology: Ordered, Independent interpretation, Details: Personally reviewed CT of the abdomen and pelvis, do not appreciate obstructive bowel gas pattern, significant free fluid, free air., and All images reviewed independently.  Agree with radiology report at this time.   CT ABDOMEN PELVIS W CONTRAST Result Date: 05/24/2024 EXAM: CT ABDOMEN AND PELVIS WITH CONTRAST 05/24/2024 08:42:08 AM TECHNIQUE: CT of the abdomen and pelvis was performed with the administration of 80 mL of iohexol  (OMNIPAQUE ) 300 MG/ML solution. Multiplanar reformatted images are provided for review. Automated exposure control, iterative reconstruction, and/or weight-based adjustment of the mA/kV was utilized to reduce the radiation dose to as low as reasonably achievable. COMPARISON: Recent CT abdomen and pelvis 05/21/2024, and earlier. CLINICAL HISTORY: 58 year old female. Abdominal pain, acute, nonlocalized; Abdominal/flank pain, stone suspected. FINDINGS: LOWER CHEST: Mild lung base atelectasis, overall lung base ventilation has not significantly changed. LIVER: The liver is unremarkable. GALLBLADDER AND BILE DUCTS: Gallbladder is unremarkable. No biliary ductal dilatation. SPLEEN: No acute  abnormality. PANCREAS: No acute abnormality. ADRENAL GLANDS: No acute abnormality. KIDNEYS, URETERS AND BLADDER: No stones in the kidneys or ureters. No hydronephrosis. No perinephric or periureteral stranding. Urinary bladder is unremarkable. 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 The mid transverse colon is more gas distended today, but otherwise stable bowel loops, no evidence of mechanical obstruction. No free air, free fluid, or mesenteric inflammation identified. PERITONEUM AND RETROPERITONEUM: No ascites. No free air. No free fluid. No mesenteric inflammation. VASCULATURE: Abdominal aortic  atherosclerosis is moderate. Major arterial and portal venous structures in the abdomen and pelvis appear patent. Aorta is normal in caliber. LYMPH NODES: No lymphadenopathy. REPRODUCTIVE ORGANS: Streak artifact again limits visualization of the deep pelvis. 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 Acute or subacute mild L1 compression fracture. L1 superior endplate compression fracture is new. Mild superior endplate deformity. L1 now measures 19.3 mm versus 22.8 mm normally. This is a 15% loss of vertebral body height. Subtle retropulsion of the posterior superior endplate on series 9 image 94. No significant spinal stenosis or complicating features. 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 No other acute osseous abnormality identified. IMPRESSION: 1. Acute mild L1 compression fracture with 15% loss of height. No significant retropulsion or other complicating features. 2. No other acute or inflammatory process identified in the abdomen or pelvis. 3. Stable roux-en-y type gastric bypass. Calcified aortic atherosclerosis. Electronically signed by: Helayne Hurst MD 05/24/2024 08:52 AM EST RP Workstation: [id number]   CT ABDOMEN PELVIS W CONTRAST Result Date: 05/21/2024 CLINICAL DATA:  Upper abdominal pain for 3 days with nausea and vomiting for 2 days. EXAM: CT ABDOMEN AND PELVIS WITH CONTRAST TECHNIQUE: Multidetector CT imaging of the abdomen and pelvis was performed using the standard protocol following bolus administration of intravenous contrast. RADIATION DOSE REDUCTION: This exam was performed according to the departmental dose-optimization program which includes automated exposure control, adjustment of the mA and/or kV according to patient size and/or use of iterative reconstruction technique. CONTRAST:  75mL OMNIPAQUE   IOHEXOL  350 MG/ML SOLN COMPARISON:  03/29/2024 and older exams. FINDINGS: Lower chest: Clear lung bases. Hepatobiliary: No focal liver abnormality is seen. No gallstones, gallbladder wall thickening, or biliary dilatation. Pancreas: Unremarkable. No pancreatic ductal dilatation or surrounding inflammatory changes. Spleen: Normal in size without focal abnormality. Adrenals/Urinary Tract: Normal adrenal glands. Normal kidneys and ureters. Bladder decompressed. Stomach/Bowel: Previous gastric bypass surgery. No evidence of bowel obstruction. No stomach or bowel wall thickening or inflammation. Vascular/Lymphatic: Aortic atherosclerosis. No aneurysm. No enlarged lymph nodes. Reproductive: Status post hysterectomy. No adnexal masses. Other: No hernia.  No ascites. Musculoskeletal: Chronic compression fracture of T9. No acute fracture. No bone lesion. 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 IMPRESSION: 1. No acute findings. 2. Previous Roux-en-Y gastric bypass, without evidence of bowel obstruction or inflammation and unchanged from the prior CT. 3. Aortic atherosclerosis. Electronically Signed   By: Alm Parkins M.D.   On: 05/21/2024 11:06   DG Chest 1 View Result Date: 05/21/2024 CLINICAL DATA:  Cough.  Shortness of breath. EXAM: CHEST  1 VIEW COMPARISON:  02/03/2024 FINDINGS: The heart size and mediastinal contours are within normal limits. Both lungs are clear. The visualized skeletal structures are unremarkable. IMPRESSION: No active disease. Electronically Signed   By: Norleen DELENA Kil M.D.   On: 05/21/2024 09:42  EKG/Medicine tests: Ordered and Independent interpretation EKG Interpretation:    Interventions: Zofran , Bentyl , LR bolus   See the EMR for full details regarding lab and imaging results.  Patient Eilene presents for abdominal pain.  Reports that she is having worsened pain as well as new since last night.  Chart from last night does note that patient reported  vomiting at that time, however patient provides different history stating that vomiting did not start until after she was discharged.  Patient does have diffuse abdominal tenderness, and has left-sided flank pain and tenderness versus right-sided which was reported last night.  Additionally, patient is tachycardic to 115 in triage, 110 on my exam on palpation of the radial pulse, which is also new from patient's prior discharge.  Given this new vital sign change, patient's age, do feel that patient warrants labs and imaging as per initial plan above.  Additionally will treat symptomatically with Zofran , Bentyl , LR bolus.  Patient's labs overall reassuring, did initially have mild lactic acid elevation, which could be result of losses from vomiting which the patient reports.  After fluid, lactic acid did normalize and patient did have resolution of symptoms after symptomatic treatment with Bentyl  and Zofran .  CT did note concern for acute mild L1 compression fracture with minimal height loss.  Patient does not have any pain or tenderness in this area, therefore do not feel that she requires further imaging for it at this time.  Patient did state that she was in a hurry to leave the emergency department, discussed that we needed her final repeat lactic which was at the time pending, discussed plan for discharge should lactic be normal, lactic did return normal, patient eloped just prior to being able to be given papers, however CT findings and reassurance against emergent etiology of symptoms was discussed with patient at bedside verbally.  Presentation is most consistent with acute complicated illness, Current presentation is complicated by underlying chronic conditions, and I did consider and rule out acute life/limb-threatening illness  Discussion of management or test interpretations with external provider(s): Not indicated  Risk Drugs:OTC drugs and Prescription drug management  Disposition: DISCHARGE:  I believe that the patient is safe for discharge home with outpatient follow-up. Patient was informed of all pertinent physical exam, laboratory, and imaging findings verbally, patient eloped prior to being given her paperwork. Patient's suspected etiology of their symptom presentation was discussed with the patient and all questions were answered. We discussed following up with PCP. I provided thorough ED return precautions. The patient feels safe and comfortable with this plan.  MDM generated using voice dictation software and may contain dictation errors.  Please contact me for any clarification or with any questions.  Clinical Impression:  1. Nausea and vomiting, unspecified vomiting type      Discharge   Final Clinical Impression(s) / ED Diagnoses Final diagnoses:  Nausea and vomiting, unspecified vomiting type    Rx / DC Orders ED Discharge Orders     None        Rogelia Jerilynn RAMAN, MD 05/24/24 1315

## 2024-05-24 NOTE — Telephone Encounter (Signed)
 Called to make an appointment from referral. Spoke to Cheriah Dargon, daughter who told me her mom is in the hospital with some mental issues. Explained that we were scheduling for a GYN referral, and she could give us  a call to schedule when she was ready to be seen. She stated that was fine.

## 2024-05-24 NOTE — ED Notes (Signed)
 Patient persistently coming out of room asking to leave. Provider brought to bedside and spoke with patient on plan of care for fluids to finish being administered and then a lactic lab recollected. Patient verbalized understanding. Patient then came out of room and stated she pulled her iv out she was leaving. Provider made aware and brought to bedside.

## 2024-05-24 NOTE — ED Notes (Signed)
 Patient transported to CT

## 2024-05-25 ENCOUNTER — Emergency Department (HOSPITAL_COMMUNITY)

## 2024-05-25 ENCOUNTER — Emergency Department (HOSPITAL_COMMUNITY)
Admission: EM | Admit: 2024-05-25 | Discharge: 2024-05-25 | Disposition: A | Discharge status: Home / Self Care | Attending: Emergency Medicine | Admitting: Emergency Medicine

## 2024-05-25 ENCOUNTER — Other Ambulatory Visit: Payer: Self-pay

## 2024-05-25 ENCOUNTER — Encounter (HOSPITAL_COMMUNITY): Payer: Self-pay

## 2024-05-25 DIAGNOSIS — R079 Chest pain, unspecified: Secondary | ICD-10-CM | POA: Insufficient documentation

## 2024-05-25 DIAGNOSIS — Z7984 Long term (current) use of oral hypoglycemic drugs: Secondary | ICD-10-CM | POA: Diagnosis not present

## 2024-05-25 DIAGNOSIS — J449 Chronic obstructive pulmonary disease, unspecified: Secondary | ICD-10-CM | POA: Diagnosis not present

## 2024-05-25 DIAGNOSIS — M25561 Pain in right knee: Secondary | ICD-10-CM | POA: Insufficient documentation

## 2024-05-25 DIAGNOSIS — W19XXXA Unspecified fall, initial encounter: Secondary | ICD-10-CM | POA: Diagnosis not present

## 2024-05-25 DIAGNOSIS — E119 Type 2 diabetes mellitus without complications: Secondary | ICD-10-CM | POA: Insufficient documentation

## 2024-05-25 DIAGNOSIS — R202 Paresthesia of skin: Secondary | ICD-10-CM | POA: Diagnosis not present

## 2024-05-25 DIAGNOSIS — M79604 Pain in right leg: Secondary | ICD-10-CM | POA: Diagnosis not present

## 2024-05-25 DIAGNOSIS — S32010A Wedge compression fracture of first lumbar vertebra, initial encounter for closed fracture: Secondary | ICD-10-CM

## 2024-05-25 DIAGNOSIS — M542 Cervicalgia: Secondary | ICD-10-CM | POA: Insufficient documentation

## 2024-05-25 DIAGNOSIS — S32019A Unspecified fracture of first lumbar vertebra, initial encounter for closed fracture: Secondary | ICD-10-CM | POA: Insufficient documentation

## 2024-05-25 DIAGNOSIS — R519 Headache, unspecified: Secondary | ICD-10-CM | POA: Insufficient documentation

## 2024-05-25 DIAGNOSIS — R112 Nausea with vomiting, unspecified: Secondary | ICD-10-CM | POA: Insufficient documentation

## 2024-05-25 DIAGNOSIS — S3992XA Unspecified injury of lower back, initial encounter: Secondary | ICD-10-CM | POA: Diagnosis present

## 2024-05-25 DIAGNOSIS — M5410 Radiculopathy, site unspecified: Secondary | ICD-10-CM

## 2024-05-25 LAB — COMPREHENSIVE METABOLIC PANEL WITH GFR
ALT: 36 U/L (ref 0–44)
AST: 73 U/L — ABNORMAL HIGH (ref 15–41)
Albumin: 3.1 g/dL — ABNORMAL LOW (ref 3.5–5.0)
Alkaline Phosphatase: 60 U/L (ref 38–126)
Anion gap: 9 (ref 5–15)
BUN: 5 mg/dL — ABNORMAL LOW (ref 6–20)
CO2: 28 mmol/L (ref 22–32)
Calcium: 8.3 mg/dL — ABNORMAL LOW (ref 8.9–10.3)
Chloride: 97 mmol/L — ABNORMAL LOW (ref 98–111)
Creatinine, Ser: 0.65 mg/dL (ref 0.44–1.00)
GFR, Estimated: >60 mL/min (ref >60)
Glucose, Bld: 100 mg/dL — ABNORMAL HIGH (ref 70–99)
Potassium: 3.4 mmol/L — ABNORMAL LOW (ref 3.5–5.1)
Sodium: 134 mmol/L — ABNORMAL LOW (ref 135–145)
Total Bilirubin: 0.9 mg/dL (ref 0.0–1.2)
Total Protein: 6.2 g/dL — ABNORMAL LOW (ref 6.5–8.1)

## 2024-05-25 LAB — CBC WITH DIFFERENTIAL/PLATELET
Abs Immature Granulocytes: 0.02 K/uL (ref 0.00–0.07)
Basophils Absolute: 0 K/uL (ref 0.0–0.1)
Basophils Relative: 0 %
Eosinophils Absolute: 0 K/uL (ref 0.0–0.5)
Eosinophils Relative: 0 %
HCT: 30 % — ABNORMAL LOW (ref 36.0–46.0)
Hemoglobin: 9.1 g/dL — ABNORMAL LOW (ref 12.0–15.0)
Immature Granulocytes: 0 %
Lymphocytes Relative: 21 %
Lymphs Abs: 1.5 K/uL (ref 0.7–4.0)
MCH: 22.2 pg — ABNORMAL LOW (ref 26.0–34.0)
MCHC: 30.3 g/dL (ref 30.0–36.0)
MCV: 73.3 fL — ABNORMAL LOW (ref 80.0–100.0)
Monocytes Absolute: 0.5 K/uL (ref 0.1–1.0)
Monocytes Relative: 7 %
Neutro Abs: 5.1 K/uL (ref 1.7–7.7)
Neutrophils Relative %: 72 %
Platelets: 204 K/uL (ref 150–400)
RBC: 4.09 MIL/uL (ref 3.87–5.11)
RDW: 20.7 % — ABNORMAL HIGH (ref 11.5–15.5)
WBC: 7.2 K/uL (ref 4.0–10.5)
nRBC: 0 % (ref 0.0–0.2)

## 2024-05-25 LAB — I-STAT CG4 LACTIC ACID, ED: Lactic Acid, Venous: 1.1 mmol/L (ref 0.5–1.9)

## 2024-05-25 LAB — TROPONIN I (HIGH SENSITIVITY): Troponin I (High Sensitivity): 3 ng/L (ref <18)

## 2024-05-25 LAB — LIPASE, BLOOD: Lipase: 31 U/L (ref 11–51)

## 2024-05-25 MED ORDER — ONDANSETRON 4 MG PO TBDP
4.0000 mg | ORAL_TABLET | Freq: Three times a day (TID) | ORAL | 0 refills | Status: DC | PRN
  Filled 2024-05-25: qty 20, 7d supply, fill #0

## 2024-05-25 MED ORDER — METHYLPREDNISOLONE 4 MG PO TBPK: ORAL_TABLET | ORAL | 0 refills | Status: DC

## 2024-05-25 MED ORDER — FENTANYL CITRATE (PF) 50 MCG/ML IJ SOSY
50.0000 ug | PREFILLED_SYRINGE | Freq: Once | INTRAMUSCULAR | Status: AC
  Administered 2024-05-25: 50 ug via INTRAVENOUS
  Filled 2024-05-25: qty 1

## 2024-05-25 MED ORDER — ONDANSETRON 4 MG PO TBDP: 4.0000 mg | ORAL_TABLET | Freq: Three times a day (TID) | ORAL | 0 refills | Status: DC | PRN

## 2024-05-25 MED ORDER — OXYCODONE-ACETAMINOPHEN 5-325 MG PO TABS: 1.0000 | ORAL_TABLET | ORAL | 0 refills | Status: DC | PRN

## 2024-05-25 MED ORDER — SODIUM CHLORIDE 0.9 % IV BOLUS
1000.0000 mL | Freq: Once | INTRAVENOUS | Status: AC
  Administered 2024-05-25: 1000 mL via INTRAVENOUS

## 2024-05-25 MED ORDER — OXYCODONE-ACETAMINOPHEN 5-325 MG PO TABS
1.0000 | ORAL_TABLET | ORAL | 0 refills | Status: DC | PRN
  Filled 2024-05-25: qty 15, 3d supply, fill #0

## 2024-05-25 MED ORDER — OXYCODONE-ACETAMINOPHEN 5-325 MG PO TABS
1.0000 | ORAL_TABLET | Freq: Once | ORAL | Status: AC
  Administered 2024-05-25: 1 via ORAL
  Filled 2024-05-25: qty 1

## 2024-05-25 MED ORDER — ONDANSETRON HCL 4 MG/2ML IJ SOLN
4.0000 mg | Freq: Once | INTRAMUSCULAR | Status: AC
  Administered 2024-05-25: 4 mg via INTRAVENOUS
  Filled 2024-05-25: qty 2

## 2024-05-25 MED ORDER — METHYLPREDNISOLONE 4 MG PO TBPK
ORAL_TABLET | ORAL | 0 refills | Status: DC
  Filled 2024-05-25 – 2024-05-26 (×2): qty 21, 6d supply, fill #0

## 2024-05-25 NOTE — ED Notes (Signed)
 This NT walked passed Pt's room and the pt was yelling out about her IV hanging out of her arm. Pt's IV was at least half out. RN notified. RN told this NT that the pt is up for discharge and to take out IV completely. IV removed.

## 2024-05-25 NOTE — ED Triage Notes (Signed)
 Patient BIB EMS from home c/o N/V. Patient now also c/o knee pain. Patient given 4 of zofran  en route.

## 2024-05-25 NOTE — ED Provider Notes (Signed)
 [place] EMERGENCY DEPARTMENT AT [facility] Provider Note   CSN: [id number] Arrival date & time: 05/25/24  1710     Patient presents with: Nausea and Emesis   TASHEENA WAMBOLT is a 58 y.o. female.   The history is provided by the patient and medical records. No language interpreter was used.  Emesis Severity:  Moderate Duration:  4 days Timing:  Intermittent Quality:  Stomach contents Associated symptoms: headaches   Associated symptoms: no abdominal pain, no chills, no cough, no diarrhea, no fever and no URI   Back Pain Location:  Lumbar spine Quality:  Unable to specify Radiates to:  R posterior upper leg and R knee Pain severity:  Moderate Context: recent injury (falls)   Relieved by:  Nothing Worsened by:  Nothing Ineffective treatments:  None tried Associated symptoms: chest pain, headaches and numbness (intermittnet per pt)   Associated symptoms: no abdominal pain, no dysuria, no fever and no weakness        Prior to Admission medications   Medication Sig Start Date End Date Taking? Authorizing Provider  albuterol  (VENTOLIN  HFA) 108 (90 Base) MCG/ACT inhaler Inhale 2 puffs into the lungs every 4 (four) hours as needed for wheezing or shortness of breath. 04/25/24   Medina-Vargas, Monina C, NP  divalproex  (DEPAKOTE  ER) 250 MG 24 hr tablet Take 1 tablet (250 mg total) by mouth daily. 02/23/24   Medina-Vargas, Monina C, NP  famotidine  (PEPCID ) 40 MG tablet Take 1 tablet (40 mg total) by mouth daily. 05/21/24 06/20/24  Kammerer, Duwaine L, DO  ibuprofen  (ADVIL ) 600 MG tablet Take 1 tablet (600 mg total) by mouth every 6 (six) hours as needed for pain 02/25/24     levETIRAcetam  (KEPPRA ) 250 MG tablet Take 1 tablet (250 mg total) by mouth 2 (two) times daily. 05/23/24   Medina-Vargas, Monina C, NP  metFORMIN  (GLUCOPHAGE -XR) 750 MG 24 hr tablet Take 1 tablet (750 mg total) by mouth daily with breakfast. 02/23/24   Medina-Vargas, Monina C, NP  ondansetron  (ZOFRAN ) 4 MG  tablet Take 1 tablet (4 mg total) by mouth every 8 (eight) hours as needed for up to 4 days. 05/21/24 05/25/24  Kammerer, Megan L, DO  OXYGEN  Inhale 4-5 L into the lungs See admin instructions. Use every night. May use throughout the day if needed.    [provider]  sucralfate  (CARAFATE ) 1 g tablet Take 1 tablet (1 g total) by mouth in the morning, at noon, and at bedtime for 14 days. 05/21/24 06/04/24  Kammerer, Duwaine L, DO    Allergies: Iron  dextran, Aspirin , and Penicillins    Review of Systems  Constitutional:  Negative for chills, diaphoresis, fatigue and fever.  HENT:  Negative for congestion.   Eyes:  Negative for visual disturbance.  Respiratory:  Negative for cough, chest tightness, shortness of breath and wheezing.   Cardiovascular:  Positive for chest pain. Negative for palpitations and leg swelling.  Gastrointestinal:  Positive for nausea and vomiting. Negative for abdominal pain, constipation and diarrhea.  Genitourinary:  Negative for dysuria and flank pain.  Musculoskeletal:  Positive for back pain and neck pain. Negative for neck stiffness.  Skin:  Negative for rash and wound.  Neurological:  Positive for numbness (intermittnet per pt) and headaches. Negative for weakness and light-headedness.  Psychiatric/Behavioral:  Negative for agitation and confusion.   All other systems reviewed and are negative.   Updated Vital Signs LMP 01/08/2011   Physical Exam Vitals and nursing note reviewed.  Constitutional:  General: She is not in acute distress.    Appearance: She is well-developed. She is not ill-appearing, toxic-appearing or diaphoretic.  HENT:     Head: Normocephalic and atraumatic.     Nose: No congestion or rhinorrhea.     Mouth/Throat:     Mouth: Mucous membranes are dry.     Pharynx: No oropharyngeal exudate or posterior oropharyngeal erythema.  Eyes:     Conjunctiva/sclera: Conjunctivae normal.     Pupils: Pupils are equal, round, and reactive  to light.  Cardiovascular:     Rate and Rhythm: Normal rate and regular rhythm.     Pulses: Normal pulses.     Heart sounds: No murmur heard. Pulmonary:     Effort: Pulmonary effort is normal. No respiratory distress.     Breath sounds: Normal breath sounds. No wheezing, rhonchi or rales.  Chest:     Chest wall: Tenderness present.  Abdominal:     General: Abdomen is flat.     Palpations: Abdomen is soft.     Tenderness: There is no abdominal tenderness. There is no right CVA tenderness, left CVA tenderness, guarding or rebound.  Musculoskeletal:        General: Tenderness present. No swelling.     Cervical back: Neck supple. Tenderness present.  Skin:    General: Skin is warm and dry.     Capillary Refill: Capillary refill takes less than 2 seconds.     Findings: No erythema or rash.  Neurological:     General: No focal deficit present.     Mental Status: She is alert.     Motor: No weakness.  Psychiatric:        Mood and Affect: Mood normal.     (all labs ordered are listed, but only abnormal results are displayed) Labs Reviewed  CBC WITH DIFFERENTIAL/PLATELET - Abnormal; Notable for the following components:      Result Value   Hemoglobin 9.1 (*)    HCT 30.0 (*)    MCV 73.3 (*)    MCH 22.2 (*)    RDW 20.7 (*)    All other components within normal limits  COMPREHENSIVE METABOLIC PANEL WITH GFR - Abnormal; Notable for the following components:   Sodium 134 (*)    Potassium 3.4 (*)    Chloride 97 (*)    Glucose, Bld 100 (*)    BUN 5 (*)    Calcium  8.3 (*)    Total Protein 6.2 (*)    Albumin  3.1 (*)    AST 73 (*)    All other components within normal limits  LIPASE, BLOOD  I-STAT CG4 LACTIC ACID, ED  I-STAT CG4 LACTIC ACID, ED  TROPONIN I (HIGH SENSITIVITY)    EKG: None  Radiology: DG Knee Complete 4 Views Right Result Date: 05/25/2024 CLINICAL DATA:  Fall. EXAM: RIGHT KNEE - COMPLETE 4+ VIEW COMPARISON:  None Available. FINDINGS: No evidence of fracture,  dislocation, or joint effusion. There is moderate tricompartmental osteoarthrosis with joint space narrowing and osteophyte formation. Soft tissues are unremarkable. IMPRESSION: 1. No acute fracture or dislocation. 2. Moderate tricompartmental osteoarthrosis. Electronically Signed   By: Greig Pique M.D.   On: 05/25/2024 22:36   DG Chest 2 View Result Date: 05/25/2024 CLINICAL DATA:  Chest pain after fall EXAM: DG CHEST 2V COMPARISON:  Chest x-ray 05/21/2024 FINDINGS: The heart size and mediastinal contours are within normal limits. Both lungs are clear. The visualized skeletal structures are unremarkable. IMPRESSION: No active cardiopulmonary disease. Electronically Signed  By: Greig Pique M.D.   On: 05/25/2024 22:35   CT Cervical Spine Wo Contrast Result Date: 05/25/2024 CLINICAL DATA:  Status post trauma. EXAM: CT CERVICAL SPINE WITHOUT CONTRAST TECHNIQUE: Multidetector CT imaging of the cervical spine was performed without intravenous contrast. Multiplanar CT image reconstructions were also generated. RADIATION DOSE REDUCTION: This exam was performed according to the departmental dose-optimization program which includes automated exposure control, adjustment of the mA and/or kV according to patient size and/or use of iterative reconstruction technique. COMPARISON:  Oct 29, 2023 FINDINGS: Alignment: There is straightening of the normal cervical spine lordosis. Skull base and vertebrae: No acute fracture. No primary bone lesion or focal pathologic process. Soft tissues and spinal canal: No prevertebral fluid or swelling. No visible canal hematoma. Disc levels: Mild endplate sclerosis, anterior osteophyte formation and posterior bony spurring are seen at the levels of C3-C4, C4-C5 and C5-C6. Mild multilevel intervertebral disc space narrowing is seen throughout all levels of the cervical spine, slightly more prominent at the level of C2-C3. Mild, bilateral multilevel facet joint hypertrophy is noted. Upper  chest: Negative. Other: None. IMPRESSION: 1. No acute fracture or subluxation in the cervical spine. 2. Mild multilevel degenerative changes. Electronically Signed   By: Suzen Dials M.D.   On: 05/25/2024 22:04   CT Head Wo Contrast Result Date: 05/25/2024 CLINICAL DATA:  Status post fall EXAM: CT HEAD WITHOUT CONTRAST TECHNIQUE: Contiguous axial images were obtained from the base of the skull through the vertex without intravenous contrast. RADIATION DOSE REDUCTION: This exam was performed according to the departmental dose-optimization program which includes automated exposure control, adjustment of the mA and/or kV according to patient size and/or use of iterative reconstruction technique. COMPARISON:  None Available. FINDINGS: Brain: No evidence of acute infarction, hemorrhage, hydrocephalus, extra-axial collection or mass lesion/mass effect. Vascular: Mild bilateral cavernous carotid artery calcification is noted. Skull: Normal. Negative for fracture or focal lesion. Sinuses/Orbits: No acute finding. Other: None. IMPRESSION: No acute intracranial abnormality. Electronically Signed   By: Suzen Dials M.D.   On: 05/25/2024 22:01   MR LUMBAR SPINE WO CONTRAST Result Date: 05/25/2024 EXAM: MRI LUMBAR SPINE 05/25/2024 07:55:00 PM TECHNIQUE: Multiplanar multisequence MRI of the lumbar spine was performed without the administration of intravenous contrast. COMPARISON: CT abdomen and pelvis from 06/13/2024, lumbar spine radiograph 01/2024. CLINICAL HISTORY: Recent lumbar fracture with new numbness to right leg and falls. FINDINGS: BONES AND ALIGNMENT: Normal alignment except for findings at L1. There is a recent likely subacute wedge compression fracture of L1 with less than 25% height loss and approximately 2 mm of retropulsion of the posterior superior corner. Mild bone marrow edema is present at L1. Normal vertebral body heights otherwise. Bone marrow signal is unremarkable otherwise. SPINAL CORD:  The conus terminates normally. SOFT TISSUES: No paraspinal mass. L1-L2: No significant disc herniation. No spinal canal stenosis or neural foraminal narrowing. L2-L3: No significant disc herniation. No spinal canal stenosis or neural foraminal narrowing. L3-L4: No significant disc herniation. No spinal canal stenosis or neural foraminal narrowing. L4-L5: Small disc bulge and moderate facet hypertrophy. No central spinal canal or neural foraminal stenosis. L5-S1: No significant disc herniation. Mild facet hypertrophy. No central spinal canal or neural foraminal stenosis. IMPRESSION: 1. Recent, likely subacute wedge compression fracture of L1 with less than 25% height loss, approximately 2 mm of retropulsion of the posterior superior corner, and mild bone marrow edema. Electronically signed by: Franky Stanford MD 05/25/2024 08:56 PM EST RP Workstation: [id number]   CT ABDOMEN PELVIS  W CONTRAST Result Date: 05/24/2024 EXAM: CT ABDOMEN AND PELVIS WITH CONTRAST 05/24/2024 08:42:08 AM TECHNIQUE: CT of the abdomen and pelvis was performed with the administration of 80 mL of iohexol  (OMNIPAQUE ) 300 MG/ML solution. Multiplanar reformatted images are provided for review. Automated exposure control, iterative reconstruction, and/or weight-based adjustment of the mA/kV was utilized to reduce the radiation dose to as low as reasonably achievable. COMPARISON: Recent CT abdomen and pelvis 05/21/2024, and earlier. CLINICAL HISTORY: 58 year old female. Abdominal pain, acute, nonlocalized; Abdominal/flank pain, stone suspected. FINDINGS: LOWER CHEST: Mild lung base atelectasis, overall lung base ventilation has not significantly changed. LIVER: The liver is unremarkable. GALLBLADDER AND BILE DUCTS: Gallbladder is unremarkable. No biliary ductal dilatation. SPLEEN: No acute abnormality. PANCREAS: No acute abnormality. ADRENAL GLANDS: No acute abnormality. KIDNEYS, URETERS AND BLADDER: No stones in the kidneys or ureters. No  hydronephrosis. No perinephric or periureteral stranding. Urinary bladder is unremarkable. 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 The mid transverse colon is more gas distended today, but otherwise stable bowel loops, no evidence of mechanical obstruction. No free air, free fluid, or mesenteric inflammation identified. PERITONEUM AND RETROPERITONEUM: No ascites. No free air. No free fluid. No mesenteric inflammation. VASCULATURE: Abdominal aortic atherosclerosis is moderate. Major arterial and portal venous structures in the abdomen and pelvis appear patent. Aorta is normal in caliber. LYMPH NODES: No lymphadenopathy. REPRODUCTIVE ORGANS: Streak artifact again limits visualization of the deep pelvis. 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 Acute or subacute mild L1 compression fracture. L1 superior endplate compression fracture is new. Mild superior endplate deformity. L1 now measures 19.3 mm versus 22.8 mm normally. This is a 15% loss of vertebral body height. Subtle retropulsion of the posterior superior endplate on series 9 image 94. No significant spinal stenosis or complicating features. 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 No other acute osseous abnormality identified. IMPRESSION: 1. Acute mild L1 compression fracture with 15% loss of height. No significant retropulsion or other complicating features. 2. No other acute or inflammatory process identified in the abdomen or pelvis. 3. Stable roux-en-y type gastric bypass. Calcified aortic atherosclerosis. Electronically signed by: Helayne Hurst MD 05/24/2024 08:52 AM EST RP Workstation: [id number]     Procedures   Medications Ordered in the ED  fentaNYL  (SUBLIMAZE ) injection 50 mcg (50 mcg Intravenous  Given 05/25/24 2038)  ondansetron  (ZOFRAN ) injection 4 mg (4 mg Intravenous Given 05/25/24 2039)  sodium chloride  0.9 % bolus 1,000 mL (1,000 mLs Intravenous New Bag/Given 05/25/24 2152)                                    Medical Decision Making Amount and/or Complexity of Data Reviewed Labs: ordered. Radiology: ordered.  Risk Prescription drug management.    ARACELI ARANGO is a 58 y.o. female with a past medical history significant for COPD, diabetes, previous seizures, borderline personality disorder, schizoaffective disorder, atrial fibrillation, previous Roux-en-Y gastric bypass surgery, anxiety, depression, and GERD who presents for back pain with some intermittent numbness in her right leg as well as multiple falls with headache, neck pain, some chest pain, and persistent nausea and vomiting.  According to patient, she has been seen multiple times over the last few days and has had 2 reassuring CT scans  of her abdomen pelvis without acute obstruction or surgical problems.  She says that she has felt better after medications and has gone home but was still having nausea and vomiting today.  She reports had multiple falls and was told on her last CT scan that she had a lumbar fracture.  She reports she was not having symptoms in her legs however she reports that after her more recent fall she has had some intermittent numbness in her right leg and keeps falling.  She is having some pain in her head and neck after falling and still having some chest discomfort.  Denies further abdominal pain now but is still having some of the nausea and vomiting.  She reports no constipation, diarrhea, or urinary changes and denies any bowel or bladder dysfunction.  She reports he is also in pain in her right knee.  On my exam, she has some tenderness to her right knee but she had intact sensation strength and pulses in the legs.  She did not have focal numbness for me.  She did not have any abnormality on a leg  raise test.  She had some tenderness across her back and her lungs were clear.  Chest was slightly tender to palpation.  Abdomen not tender.  I heard good bowel sounds.  No focal neurologic deficits in her upper extremities with intact sensation strength and pulses.  Normal finger-nose-finger testing.  Symmetric grips.  Hoffmann sign negative.  No evidence of head trauma initially.  Pupil symmetric and reactive with normal extraocular movements.  She has some dry mucous membranes from the vomiting.  Of note, patient was able to safely ambulate around the emergency department and walked to the bathroom.  She did not have evidence of foot drop or any weakness on exam.  Will give fluids for dehydration and will get CT of the head and neck given the falls.  As she just had CT imaging of her back will get MRI given the new numbness she was intermittently having.  We will hold on repeat imaging of the abdomen pelvis given her lack of worsening symptoms there and she had a negative urine yesterday so we will hold on repeat urinalysis.  Will give some pain medicine, nausea medicine, and fluids.  Will get x-ray of the knee.  Will get chest x-ray given her chest tenderness and will get an EKG and a troponin as well.  Anticipate reassessment after workup to determine disposition.  11:11 PM Workup returned overall reassuring.  Her MRI does not show evidence of cord compression but does show the less than 25% wedge compression of L1 that is likely subacute.  I suspect this is causing some radicular pain.  Will give a Medrol  Dosepak and pain medicine and nausea medicine prescription for her.  Rest of workup similar to prior and CT head and neck were reassuring.  Patient agrees with this plan and I spoke to the patient's family on the phone who also agrees with plan.  Will plan for discharge for outpatient follow-up with PCP as well as give her neurosurgical referral.  She had no other questions or concerns and was able  to ambulate and patient will be discharged for outpatient follow-up.     Final diagnoses:  Nausea and vomiting, unspecified vomiting type  Compression fracture of L1 vertebra, initial encounter (HCC)  Radicular leg pain    ED Discharge Orders          Ordered    ondansetron  (ZOFRAN -ODT) 4  MG disintegrating tablet  Every 8 hours PRN        05/25/24 2314    oxyCODONE -acetaminophen  (PERCOCET/ROXICET) 5-325 MG tablet  Every 4 hours PRN        05/25/24 2314    methylPREDNISolone  (MEDROL  DOSEPAK) 4 MG TBPK tablet        05/25/24 2314             Clinical Impression: 1. Nausea and vomiting, unspecified vomiting type   2. Compression fracture of L1 vertebra, initial encounter (HCC)   3. Radicular leg pain     Disposition: Discharge  Condition: Good  I have discussed the results, Dx and Tx plan with the pt(& family if present). He/she/they expressed understanding and agree(s) with the plan. Discharge instructions discussed at great length. Strict return precautions discussed and pt &/or family have verbalized understanding of the instructions. No further questions at time of discharge.    New Prescriptions   METHYLPREDNISOLONE  (MEDROL  DOSEPAK) 4 MG TBPK TABLET    Follow directions on Dosepak   ONDANSETRON  (ZOFRAN -ODT) 4 MG DISINTEGRATING TABLET    Take 1 tablet (4 mg total) by mouth every 8 (eight) hours as needed for nausea or vomiting.   OXYCODONE -ACETAMINOPHEN  (PERCOCET/ROXICET) 5-325 MG TABLET    Take 1 tablet by mouth every 4 (four) hours as needed for severe pain (pain score 7-10).    Follow Up: [facility] [address] [place] [address] [phone]         Lilja Soland, Lonni PARAS, MD 05/25/24 [phone]

## 2024-05-25 NOTE — Discharge Instructions (Addendum)
 Your history, exam, workup today revealed you do have a small L1 wedge compression fracture likely causing the shooting nerve pain down your right leg.  Please use the Medrol  Dosepak steroids to help with this as well as the pain medicine.  Please the nausea medicine help with nausea.  Please rest and stay hydrated and follow-up with your primary doctor and a neurosurgeon back doctor.  If any symptoms change or worsen acutely, please return to the nearest emergency department.

## 2024-05-26 ENCOUNTER — Other Ambulatory Visit (HOSPITAL_COMMUNITY): Payer: Self-pay

## 2024-05-28 ENCOUNTER — Encounter (HOSPITAL_COMMUNITY): Payer: Self-pay | Admitting: Internal Medicine

## 2024-05-28 ENCOUNTER — Emergency Department (HOSPITAL_COMMUNITY)

## 2024-05-28 ENCOUNTER — Observation Stay (HOSPITAL_COMMUNITY)
Admission: EM | Admit: 2024-05-28 | Discharge: 2024-06-03 | DRG: 388 | Disposition: A | Attending: Internal Medicine | Admitting: Internal Medicine

## 2024-05-28 ENCOUNTER — Other Ambulatory Visit: Payer: Self-pay

## 2024-05-28 DIAGNOSIS — K56600 Partial intestinal obstruction, unspecified as to cause: Principal | ICD-10-CM | POA: Diagnosis present

## 2024-05-28 DIAGNOSIS — F1010 Alcohol abuse, uncomplicated: Secondary | ICD-10-CM | POA: Diagnosis present

## 2024-05-28 DIAGNOSIS — J9611 Chronic respiratory failure with hypoxia: Secondary | ICD-10-CM | POA: Diagnosis present

## 2024-05-28 DIAGNOSIS — Z9884 Bariatric surgery status: Secondary | ICD-10-CM

## 2024-05-28 DIAGNOSIS — J441 Chronic obstructive pulmonary disease with (acute) exacerbation: Secondary | ICD-10-CM | POA: Diagnosis present

## 2024-05-28 DIAGNOSIS — R933 Abnormal findings on diagnostic imaging of other parts of digestive tract: Secondary | ICD-10-CM

## 2024-05-28 DIAGNOSIS — G40909 Epilepsy, unspecified, not intractable, without status epilepticus: Secondary | ICD-10-CM

## 2024-05-28 DIAGNOSIS — R4182 Altered mental status, unspecified: Secondary | ICD-10-CM

## 2024-05-28 DIAGNOSIS — F259 Schizoaffective disorder, unspecified: Secondary | ICD-10-CM | POA: Diagnosis present

## 2024-05-28 DIAGNOSIS — D696 Thrombocytopenia, unspecified: Secondary | ICD-10-CM | POA: Diagnosis present

## 2024-05-28 DIAGNOSIS — R109 Unspecified abdominal pain: Secondary | ICD-10-CM | POA: Diagnosis present

## 2024-05-28 DIAGNOSIS — K56699 Other intestinal obstruction unspecified as to partial versus complete obstruction: Secondary | ICD-10-CM

## 2024-05-28 DIAGNOSIS — K449 Diaphragmatic hernia without obstruction or gangrene: Secondary | ICD-10-CM

## 2024-05-28 DIAGNOSIS — S22000A Wedge compression fracture of unspecified thoracic vertebra, initial encounter for closed fracture: Secondary | ICD-10-CM | POA: Diagnosis not present

## 2024-05-28 DIAGNOSIS — Z720 Tobacco use: Secondary | ICD-10-CM | POA: Diagnosis present

## 2024-05-28 DIAGNOSIS — M545 Low back pain, unspecified: Secondary | ICD-10-CM | POA: Diagnosis present

## 2024-05-28 DIAGNOSIS — E1169 Type 2 diabetes mellitus with other specified complication: Secondary | ICD-10-CM

## 2024-05-28 DIAGNOSIS — K2100 Gastro-esophageal reflux disease with esophagitis, without bleeding: Secondary | ICD-10-CM | POA: Diagnosis present

## 2024-05-28 DIAGNOSIS — D509 Iron deficiency anemia, unspecified: Secondary | ICD-10-CM | POA: Diagnosis present

## 2024-05-28 DIAGNOSIS — F209 Schizophrenia, unspecified: Secondary | ICD-10-CM

## 2024-05-28 DIAGNOSIS — K5901 Slow transit constipation: Secondary | ICD-10-CM | POA: Diagnosis present

## 2024-05-28 DIAGNOSIS — K5900 Constipation, unspecified: Secondary | ICD-10-CM | POA: Diagnosis present

## 2024-05-28 DIAGNOSIS — F1410 Cocaine abuse, uncomplicated: Secondary | ICD-10-CM | POA: Diagnosis present

## 2024-05-28 LAB — URINALYSIS, ROUTINE W REFLEX MICROSCOPIC
Bilirubin Urine: NEGATIVE
Glucose, UA: NEGATIVE mg/dL
Hgb urine dipstick: NEGATIVE
Ketones, ur: NEGATIVE mg/dL
Leukocytes,Ua: NEGATIVE
Nitrite: NEGATIVE
Protein, ur: NEGATIVE mg/dL
Specific Gravity, Urine: 1.043 — ABNORMAL HIGH (ref 1.005–1.030)
pH: 6 (ref 5.0–8.0)

## 2024-05-28 LAB — CBC WITH DIFFERENTIAL/PLATELET
Abs Immature Granulocytes: 0.01 K/uL (ref 0.00–0.07)
Basophils Absolute: 0 K/uL (ref 0.0–0.1)
Basophils Relative: 0 %
Eosinophils Absolute: 0.1 K/uL (ref 0.0–0.5)
Eosinophils Relative: 1 %
HCT: 30.6 % — ABNORMAL LOW (ref 36.0–46.0)
Hemoglobin: 9.1 g/dL — ABNORMAL LOW (ref 12.0–15.0)
Immature Granulocytes: 0 %
Lymphocytes Relative: 28 %
Lymphs Abs: 1.9 K/uL (ref 0.7–4.0)
MCH: 22.8 pg — ABNORMAL LOW (ref 26.0–34.0)
MCHC: 29.7 g/dL — ABNORMAL LOW (ref 30.0–36.0)
MCV: 76.7 fL — ABNORMAL LOW (ref 80.0–100.0)
Monocytes Absolute: 0.6 K/uL (ref 0.1–1.0)
Monocytes Relative: 9 %
Neutro Abs: 4.2 K/uL (ref 1.7–7.7)
Neutrophils Relative %: 62 %
Platelets: 141 K/uL — ABNORMAL LOW (ref 150–400)
RBC: 3.99 MIL/uL (ref 3.87–5.11)
RDW: 22 % — ABNORMAL HIGH (ref 11.5–15.5)
Smear Review: NORMAL
WBC: 6.9 K/uL (ref 4.0–10.5)
nRBC: 0 % (ref 0.0–0.2)

## 2024-05-28 LAB — COMPREHENSIVE METABOLIC PANEL WITH GFR
ALT: 23 U/L (ref 0–44)
AST: 25 U/L (ref 15–41)
Albumin: 3.1 g/dL — ABNORMAL LOW (ref 3.5–5.0)
Alkaline Phosphatase: 56 U/L (ref 38–126)
Anion gap: 13 (ref 5–15)
BUN: <5 mg/dL — ABNORMAL LOW (ref 6–20)
CO2: 24 mmol/L (ref 22–32)
Calcium: 8.4 mg/dL — ABNORMAL LOW (ref 8.9–10.3)
Chloride: 98 mmol/L (ref 98–111)
Creatinine, Ser: 0.55 mg/dL (ref 0.44–1.00)
GFR, Estimated: >60 mL/min (ref >60)
Glucose, Bld: 162 mg/dL — ABNORMAL HIGH (ref 70–99)
Potassium: 4 mmol/L (ref 3.5–5.1)
Sodium: 135 mmol/L (ref 135–145)
Total Bilirubin: 0.5 mg/dL (ref 0.0–1.2)
Total Protein: 6.4 g/dL — ABNORMAL LOW (ref 6.5–8.1)

## 2024-05-28 LAB — PROCALCITONIN: Procalcitonin: 13.96 ng/mL

## 2024-05-28 LAB — VALPROIC ACID LEVEL: Valproic Acid Lvl: <10 ug/mL — ABNORMAL LOW (ref 50–100)

## 2024-05-28 LAB — LIPASE, BLOOD: Lipase: 27 U/L (ref 11–51)

## 2024-05-28 MED ORDER — OXYCODONE-ACETAMINOPHEN 5-325 MG PO TABS
1.0000 | ORAL_TABLET | ORAL | Status: DC | PRN
  Administered 2024-05-29 – 2024-06-03 (×22): 1 via ORAL
  Filled 2024-05-28 (×23): qty 1

## 2024-05-28 MED ORDER — THIAMINE HCL 100 MG PO TABS: 100.0000 mg | ORAL_TABLET | Freq: Every day | ORAL | 1 refills | Status: DC

## 2024-05-28 MED ORDER — METFORMIN HCL ER 750 MG PO TB24: 750.0000 mg | ORAL_TABLET | Freq: Every day | ORAL | 1 refills | Status: AC

## 2024-05-28 MED ORDER — LORAZEPAM 2 MG/ML IJ SOLN
0.0000 mg | Freq: Two times a day (BID) | INTRAMUSCULAR | Status: AC
  Administered 2024-05-30: 2 mg via INTRAVENOUS
  Administered 2024-05-31 (×2): 1 mg via INTRAVENOUS
  Filled 2024-05-28 (×3): qty 1

## 2024-05-28 MED ORDER — ONDANSETRON HCL 4 MG/2ML IJ SOLN
4.0000 mg | Freq: Four times a day (QID) | INTRAMUSCULAR | Status: DC | PRN
  Administered 2024-05-30 – 2024-06-01 (×2): 4 mg via INTRAVENOUS
  Filled 2024-05-28 (×2): qty 2

## 2024-05-28 MED ORDER — ACETAMINOPHEN 650 MG RE SUPP: 650.0000 mg | Freq: Four times a day (QID) | RECTAL | Status: DC | PRN

## 2024-05-28 MED ORDER — LORAZEPAM 1 MG PO TABS
1.0000 mg | ORAL_TABLET | ORAL | Status: AC | PRN
  Administered 2024-05-28 (×2): 1 mg via ORAL
  Administered 2024-05-28: 3 mg via ORAL
  Administered 2024-05-29: 2 mg via ORAL
  Filled 2024-05-28 (×2): qty 1
  Filled 2024-05-28 (×2): qty 3

## 2024-05-28 MED ORDER — ONDANSETRON HCL 4 MG PO TABS: 4.0000 mg | ORAL_TABLET | Freq: Four times a day (QID) | ORAL | Status: DC | PRN

## 2024-05-28 MED ORDER — THIAMINE HCL 100 MG/ML IJ SOLN
100.0000 mg | Freq: Every day | INTRAMUSCULAR | Status: DC
  Administered 2024-05-29: 100 mg via INTRAVENOUS
  Filled 2024-05-28 (×2): qty 2

## 2024-05-28 MED ORDER — DIVALPROEX SODIUM ER 250 MG PO TB24
250.0000 mg | ORAL_TABLET | Freq: Every day | ORAL | Status: DC
  Administered 2024-05-28 – 2024-06-03 (×7): 250 mg via ORAL
  Filled 2024-05-28 (×7): qty 1

## 2024-05-28 MED ORDER — ARIPIPRAZOLE 5 MG PO TABS: 5.0000 mg | ORAL_TABLET | Freq: Every day | ORAL | 1 refills | Status: DC

## 2024-05-28 MED ORDER — IOHEXOL 350 MG/ML SOLN
75.0000 mL | Freq: Once | INTRAVENOUS | Status: AC | PRN
  Administered 2024-05-28: 75 mL via INTRAVENOUS

## 2024-05-28 MED ORDER — IPRATROPIUM-ALBUTEROL 0.5-2.5 (3) MG/3ML IN SOLN
3.0000 mL | Freq: Once | RESPIRATORY_TRACT | Status: AC
  Administered 2024-05-28: 3 mL via RESPIRATORY_TRACT
  Filled 2024-05-28: qty 3

## 2024-05-28 MED ORDER — SODIUM CHLORIDE 0.9 % IV SOLN: INTRAVENOUS | Status: DC

## 2024-05-28 MED ORDER — LORAZEPAM 2 MG/ML IJ SOLN
0.0000 mg | Freq: Four times a day (QID) | INTRAMUSCULAR | Status: AC
  Administered 2024-05-29 (×3): 2 mg via INTRAVENOUS
  Filled 2024-05-28 (×3): qty 1

## 2024-05-28 MED ORDER — LORAZEPAM 2 MG/ML IJ SOLN
1.0000 mg | INTRAMUSCULAR | Status: AC | PRN
  Administered 2024-05-28 – 2024-05-31 (×4): 2 mg via INTRAVENOUS
  Filled 2024-05-28 (×4): qty 1

## 2024-05-28 MED ORDER — ACETAMINOPHEN 500 MG PO TABS
1000.0000 mg | ORAL_TABLET | Freq: Once | ORAL | Status: AC
  Administered 2024-05-28: 1000 mg via ORAL
  Filled 2024-05-28: qty 2

## 2024-05-28 MED ORDER — POLYETHYLENE GLYCOL 3350 17 GM/SCOOP PO POWD
238.0000 g | Freq: Once | ORAL | Status: AC
  Administered 2024-05-28: 238 g via ORAL
  Filled 2024-05-28: qty 238

## 2024-05-28 MED ORDER — DIVALPROEX SODIUM ER 250 MG PO TB24: 250.0000 mg | ORAL_TABLET | Freq: Every day | ORAL | 1 refills | Status: DC

## 2024-05-28 MED ORDER — PANTOPRAZOLE SODIUM 40 MG IV SOLR
40.0000 mg | Freq: Two times a day (BID) | INTRAVENOUS | Status: DC
  Administered 2024-05-28 – 2024-06-03 (×12): 40 mg via INTRAVENOUS
  Filled 2024-05-28 (×13): qty 10

## 2024-05-28 MED ORDER — ALBUTEROL SULFATE (2.5 MG/3ML) 0.083% IN NEBU
2.5000 mg | INHALATION_SOLUTION | RESPIRATORY_TRACT | Status: DC | PRN
  Administered 2024-05-29 – 2024-06-01 (×3): 2.5 mg via RESPIRATORY_TRACT
  Filled 2024-05-28 (×4): qty 3

## 2024-05-28 MED ORDER — LEVETIRACETAM 250 MG PO TABS
250.0000 mg | ORAL_TABLET | Freq: Two times a day (BID) | ORAL | Status: DC
  Administered 2024-05-28 – 2024-06-03 (×12): 250 mg via ORAL
  Filled 2024-05-28 (×13): qty 1

## 2024-05-28 MED ORDER — ACETAMINOPHEN 325 MG PO TABS
650.0000 mg | ORAL_TABLET | Freq: Four times a day (QID) | ORAL | Status: DC | PRN
  Administered 2024-05-30 – 2024-06-02 (×6): 650 mg via ORAL
  Filled 2024-05-28 (×6): qty 2

## 2024-05-28 MED ORDER — SUCRALFATE 1 G PO TABS
1.0000 g | ORAL_TABLET | Freq: Three times a day (TID) | ORAL | Status: DC
  Administered 2024-05-28 – 2024-06-01 (×10): 1 g via ORAL
  Filled 2024-05-28 (×11): qty 1

## 2024-05-28 MED ORDER — NICOTINE 21 MG/24HR TD PT24
21.0000 mg | MEDICATED_PATCH | Freq: Every day | TRANSDERMAL | Status: DC
  Administered 2024-05-28 – 2024-06-03 (×7): 21 mg via TRANSDERMAL
  Filled 2024-05-28 (×7): qty 1

## 2024-05-28 MED ORDER — FOLIC ACID 1 MG PO TABS
1.0000 mg | ORAL_TABLET | Freq: Every day | ORAL | Status: DC
  Administered 2024-05-28 – 2024-06-03 (×6): 1 mg via ORAL
  Filled 2024-05-28 (×6): qty 1

## 2024-05-28 MED ORDER — HYDROMORPHONE HCL 1 MG/ML IJ SOLN
1.0000 mg | Freq: Once | INTRAMUSCULAR | Status: AC
  Administered 2024-05-28: 1 mg via INTRAVENOUS
  Filled 2024-05-28: qty 1

## 2024-05-28 MED ORDER — ALBUTEROL SULFATE HFA 108 (90 BASE) MCG/ACT IN AERS: 2.0000 | INHALATION_SPRAY | RESPIRATORY_TRACT | 1 refills | Status: DC | PRN

## 2024-05-28 MED ORDER — SODIUM CHLORIDE 0.9% FLUSH
3.0000 mL | Freq: Two times a day (BID) | INTRAVENOUS | Status: DC
  Administered 2024-05-28 – 2024-06-03 (×12): 3 mL via INTRAVENOUS

## 2024-05-28 MED ORDER — THIAMINE MONONITRATE 100 MG PO TABS
100.0000 mg | ORAL_TABLET | Freq: Every day | ORAL | Status: DC
  Administered 2024-05-28 – 2024-06-03 (×6): 100 mg via ORAL
  Filled 2024-05-28 (×6): qty 1

## 2024-05-28 MED ORDER — ONDANSETRON HCL 4 MG/2ML IJ SOLN
4.0000 mg | Freq: Once | INTRAMUSCULAR | Status: AC
  Administered 2024-05-28: 4 mg via INTRAVENOUS
  Filled 2024-05-28: qty 2

## 2024-05-28 MED ORDER — GUAIFENESIN ER 600 MG PO TB12
600.0000 mg | ORAL_TABLET | Freq: Two times a day (BID) | ORAL | Status: DC
  Administered 2024-05-28 – 2024-06-03 (×12): 600 mg via ORAL
  Filled 2024-05-28 (×13): qty 1

## 2024-05-28 MED ORDER — ENOXAPARIN SODIUM 40 MG/0.4ML IJ SOSY
40.0000 mg | PREFILLED_SYRINGE | INTRAMUSCULAR | Status: AC
  Administered 2024-05-28 – 2024-06-01 (×5): 40 mg via SUBCUTANEOUS
  Filled 2024-05-28 (×5): qty 0.4

## 2024-05-28 NOTE — ED Notes (Addendum)
 Patient caught smoking in room. Patient asked to stop smoking and educated on the importance of not smoking in the hospital. Charge nurse and security notified.

## 2024-05-28 NOTE — ED Provider Notes (Signed)
 [place] [facility]ERGENCY DEPARTMENT AT [facility] Provider Note   CSN: [id number] Arrival date & time: 05/28/24  0413     Patient presents with: Shortness of Breath, Abdominal Pain, and Back Pain   [name] is a 58 y.o. female.  {Add pertinent medical, surgical, social history, OB history to HPI:32947} HPI     This is a 58 year old female well-known to our emergency department who presents with multiple complaints including shortness of breath, abdominal pain, back pain.  Patient reports that she thinks she had a seizure.  She states that she woke up on the floor.  She believes she may have hurt her back.  She has ongoing shortness of breath.  No noncompliance with her home medications.  Per EMS was on 85% on room air.  She does have oxygen  at home.  She reports that she has recently been out of her seizure medication but cannot tell me what she usually takes.  Prior to Admission medications   Medication Sig Start Date End Date Taking? Authorizing Provider  albuterol  (VENTOLIN  HFA) 108 (90 Base) MCG/ACT inhaler Inhale 2 puffs into the lungs every 4 (four) hours as needed for wheezing or shortness of breath. 04/25/24   Medina-Vargas, Monina C, NP  divalproex  (DEPAKOTE  ER) 250 MG 24 hr tablet Take 1 tablet (250 mg total) by mouth daily. 02/23/24   Medina-Vargas, Monina C, NP  famotidine  (PEPCID ) 40 MG tablet Take 1 tablet (40 mg total) by mouth daily. 05/21/24 06/20/24  Kammerer, Duwaine L, DO  ibuprofen  (ADVIL ) 600 MG tablet Take 1 tablet (600 mg total) by mouth every 6 (six) hours as needed for pain 02/25/24     levETIRAcetam  (KEPPRA ) 250 MG tablet Take 1 tablet (250 mg total) by mouth 2 (two) times daily. 05/23/24   Medina-Vargas, Monina C, NP  metFORMIN  (GLUCOPHAGE -XR) 750 MG 24 hr tablet Take 1 tablet (750 mg total) by mouth daily with breakfast. 02/23/24   Medina-Vargas, Monina C, NP  methylPREDNISolone  (MEDROL  DOSEPAK) 4 MG TBPK tablet Follow directions on Dosepak 05/25/24    Tegeler, Lonni PARAS, MD  ondansetron  (ZOFRAN -ODT) 4 MG disintegrating tablet Dissolve 1 tablet (4 mg total) by mouth every 8 (eight) hours as needed for nausea or vomiting. 05/25/24   Tegeler, Lonni PARAS, MD  oxyCODONE -acetaminophen  (PERCOCET/ROXICET) 5-325 MG tablet Take 1 tablet by mouth every 4 (four) hours as needed for severe pain (pain score 7-10). 05/25/24   Tegeler, Lonni PARAS, MD  OXYGEN  Inhale 4-5 L into the lungs See admin instructions. Use every night. May use throughout the day if needed.    [provider]  sucralfate  (CARAFATE ) 1 g tablet Take 1 tablet (1 g total) by mouth in the morning, at noon, and at bedtime for 14 days. 05/21/24 06/04/24  Kammerer, Megan L, DO    Allergies: Iron  dextran, Aspirin , and Penicillins    Review of Systems  Constitutional:  Negative for fever.  Respiratory:  Positive for shortness of breath.   Cardiovascular:  Negative for chest pain.  Gastrointestinal:  Negative for abdominal pain.  Musculoskeletal:  Positive for back pain.  All other systems reviewed and are negative.   Updated Vital Signs BP (!) 158/84   Pulse (!) 110   Temp 98.3 F (36.8 C) (Oral)   Resp (!) 22   LMP 01/08/2011   SpO2 96%   Physical Exam Vitals and nursing note reviewed.  Constitutional:      Comments: Chronically ill-appearing  HENT:     Head: Normocephalic  and atraumatic.  Eyes:     Pupils: Pupils are equal, round, and reactive to light.  Cardiovascular:     Rate and Rhythm: Normal rate and regular rhythm.     Heart sounds: Normal heart sounds.  Pulmonary:     Effort: Pulmonary effort is normal. Tachypnea present. No respiratory distress.     Breath sounds: Wheezing present.     Comments: Tachypnea noted, nasal cannula in place, expiratory wheezing in all lung fields Abdominal:     General: Bowel sounds are normal.     Palpations: Abdomen is soft.     Comments: Extensive abdominal scarring  Musculoskeletal:     Cervical back: Neck  supple.     Comments: Tenderness to palpation lumbar spine without midline step-off or deformity  Skin:    General: Skin is warm and dry.  Neurological:     Mental Status: She is alert and oriented to person, place, and time.  Psychiatric:        Mood and Affect: Mood normal.     (all labs ordered are listed, but only abnormal results are displayed) Labs Reviewed  CBC WITH DIFFERENTIAL/PLATELET - Abnormal; Notable for the following components:      Result Value   Hemoglobin 9.1 (*)    HCT 30.6 (*)    MCV 76.7 (*)    MCH 22.8 (*)    MCHC 29.7 (*)    RDW 22.0 (*)    Platelets 141 (*)    All other components within normal limits  COMPREHENSIVE METABOLIC PANEL WITH GFR - Abnormal; Notable for the following components:   Glucose, Bld 162 (*)    BUN <5 (*)    Calcium  8.4 (*)    Total Protein 6.4 (*)    Albumin  3.1 (*)    All other components within normal limits  LIPASE, BLOOD  URINALYSIS, ROUTINE W REFLEX MICROSCOPIC  VALPROIC  ACID LEVEL    EKG: None  Radiology: No results found.  {Document cardiac monitor, telemetry assessment procedure when appropriate:32947} Procedures   Medications Ordered in the ED  ipratropium-albuterol  (DUONEB) 0.5-2.5 (3) MG/3ML nebulizer solution 3 mL (3 mLs Nebulization Given 05/28/24 0509)  acetaminophen  (TYLENOL ) tablet 1,000 mg (1,000 mg Oral Given 05/28/24 0509)      {Click here for ABCD2, HEART and other calculators REFRESH Note before signing:1}                              Medical Decision Making Amount and/or Complexity of Data Reviewed Labs: ordered. Radiology: ordered.  Risk OTC drugs. Prescription drug management.   ***  {Document critical care time when appropriate  Document review of labs and clinical decision tools ie CHADS2VASC2, etc  Document your independent review of radiology images and any outside records  Document your discussion with family members, caretakers and with consultants  Document social  determinants of health affecting pt's care  Document your decision making why or why not admission, treatments were needed:32947:::1}   Final diagnoses:  None    ED Discharge Orders     None

## 2024-05-28 NOTE — ED Notes (Signed)
 Patient requesting to leave stating, ya'll won't feed me. Patient educated the reason she has not been given anything is because she is NPO. MD notified and states patient can be on clear liquids now.

## 2024-05-28 NOTE — ED Provider Notes (Signed)
  Physical Exam  BP (!) 152/90 (BP Location: Left Arm)   Pulse (!) 106   Temp 98.3 F (36.8 C) (Oral)   Resp 18   Ht 5' 2 (1.575 m)   LMP 01/08/2011   SpO2 91%   BMI 22.86 kg/m   Physical Exam Vitals and nursing note reviewed.  Constitutional:      General: She is not in acute distress.    Appearance: She is well-developed.  HENT:     Head: Normocephalic and atraumatic.  Eyes:     Conjunctiva/sclera: Conjunctivae normal.  Cardiovascular:     Rate and Rhythm: Normal rate and regular rhythm.     Heart sounds: No murmur heard. Pulmonary:     Effort: Pulmonary effort is normal. No respiratory distress.     Breath sounds: Normal breath sounds.  Abdominal:     Palpations: Abdomen is soft.     Tenderness: There is no abdominal tenderness.  Musculoskeletal:        General: No swelling.     Cervical back: Neck supple.  Skin:    General: Skin is warm and dry.     Capillary Refill: Capillary refill takes less than 2 seconds.  Neurological:     Mental Status: She is alert.  Psychiatric:        Mood and Affect: Mood normal.     Procedures  Procedures  ED Course / MDM   Clinical Course as of 05/28/24 1539  Sat May 28, 2024  0645 Abdominal imaging appears more gas distention than baseline.  She is complaining of abdominal pain.  No nausea or vomiting.  Recent CT imaging on the third that had some stable gaseous distention.  I have independently reviewed the scout imaging myself.  She does appear more distended today on the x-ray imaging which makes me inclined to reimage her with CT. [CH]  0947 CT ABDOMEN PELVIS W CONTRAST [TL]    Clinical Course User Index [CH] Horton, Charmaine FALCON, MD [TL] Simon Lavonia SAILOR, MD   Medical Decision Making Amount and/or Complexity of Data Reviewed Labs: ordered. Radiology: ordered. Decision-making details documented in ED Course.  Risk OTC drugs. Prescription drug management. Decision regarding hospitalization.   Patient presents today  because shortness of breath, abdominal pain.  Back pain.  Lumbar x-ray negative.  Patient had nonspecific gaseous distention of the colon.  Obtaining CT scan of the abdomen.  Checked on the patient.  Patient was resting.  Sleeping.  Heart rate in the 90s.  O2 saturation 98% on 2 L nasal cannula.  Spoke to the patient, she has not of her medications.  Will refill her Depakote , metformin    General surgery consult: CT scan consistent for focal short segment luminal narrowing at the junction between the descending colon and sigmoid colon.  Given this, consult with general surgery.  They will see the patient.  Asked for admission to medicine      Simon Lavonia SAILOR, MD 05/28/24 1539

## 2024-05-28 NOTE — ED Notes (Signed)
 Pt removed EKG leads from chest. Education pt on importance of keeping those on.

## 2024-05-28 NOTE — ED Notes (Signed)
 Patient keeps taking all monitoring devices off. This is the 2nd time.

## 2024-05-28 NOTE — ED Notes (Signed)
 Attempted to call Dr for update. No answer.

## 2024-05-28 NOTE — Consult Note (Signed)
 Consult Note  [name] 14-Dec-1965  [id number].    Requesting MD: Lavonia Pat, MD Chief Complaint/Reason for Consult: possible large bowel obstruction  HPI:  Patient is a 58 year old female who presented to the ED this AM with multiple complaints including SOB, abdominal pain, back pain and possible seizure. Patient reported to ED provider not taking seizure medication recently due to being out of this. She reported some abdominal pain across lower abdomen starting last night. Last BM and flatus was yesterday. She reports she generally has a BM every morning and thinks this is secondary to her diabetes and seizure medications. Does not take laxatives on a regular basis. Vomited overnight but unclear character of this. Patient unsure if she has ever had a colonoscopy. Prior abdominal surgery includes hysterectomy, laparoscopic gastric bypass in 2017, ex-lap with small bowel resection in 2023 for small bowel stricture, ex-lap and abdominal closure with retention sutures in 2023 for fascial dehiscence. Not on blood thinners. PMH otherwise significant for EtOH abuse - reports 1.5 40 oz daily, COPD, HTN, HLD, Hypothyroidism, ?Hx of A. Fib, Tobacco abuse, Schizophrenia, T2DM, Chronic pain, Anxiety, Depression, GERD and Seizure disorder.   ROS: Negative other than HPI  Family History  Problem Relation Age of Onset   Diabetes Mother    Heart disease Mother    Hypertension Mother    Heart attack Father        41s   Heart attack Sister        72   COPD Other    Breast cancer Neg Hx     Past Medical History:  Diagnosis Date   Abdominal pain    Accidental drug overdose April 2013   Anxiety    Atrial fibrillation (HCC) 09/29/11   converted spontaneously   Chronic back pain    Chronic knee pain    Chronic nausea    Chronic pain    COPD (chronic obstructive pulmonary disease) (HCC)    Depression    Diabetes mellitus    states her doctor took her off all DM meds in past month    Diabetic neuropathy (HCC)    Dyspnea    with exertion    GERD (gastroesophageal reflux disease)    Headache(784.0)    migraines    HTN (hypertension)    not on meds since in a year    Hyperlipidemia    Hypothyroidism    not on meds in a while    Mental disorder    Bipolar and schizophrenic   Nausea and vomiting 01/02/2023   Requires supplemental oxygen     as needed per patient    Schizophrenia (HCC)    Schizophrenia, acute (HCC) 11/13/2017   Tobacco abuse     Past Surgical History:  Procedure Laterality Date   ABDOMINAL HYSTERECTOMY     BLADDER SUSPENSION  03/04/2011   Procedure: [facility] PROCEDURE;  Surgeon: Glendia LABOR MacDiarmid;  Location: [facility];  Service: Urology;  Laterality: N/A;   BOWEL RESECTION N/A 04/18/2022   Procedure: SMALL BOWEL RESECTION;  Surgeon: Vanderbilt Ned, MD;  Location: [facility];  Service: General;  Laterality: N/A;   CYSTOCELE REPAIR  03/04/2011   Procedure: ANTERIOR REPAIR (CYSTOCELE);  Surgeon: Glendia LABOR Elizabeth;  Location: [facility];  Service: Urology;  Laterality: N/A;   CYSTOSCOPY  03/04/2011   Procedure: CYSTOSCOPY;  Surgeon: Glendia LABOR MacDiarmid;  Location: [facility];  Service: Urology;  Laterality: N/A;   ESOPHAGOGASTRODUODENOSCOPY (EGD) WITH PROPOFOL  N/A 05/12/2017  Procedure: ESOPHAGOGASTRODUODENOSCOPY (EGD) WITH PROPOFOL ;  Surgeon: Ethyl Lenis, MD;  Location: [facility];  Service: General;  Laterality: N/A;   GASTRIC ROUX-EN-Y N/A 03/25/2016   Procedure: LAPAROSCOPIC ROUX-EN-Y GASTRIC BYPASS WITH UPPER ENDOSCOPY;  Surgeon: Morene Olives, MD;  Location: [facility];  Service: General;  Laterality: N/A;   KNEE SURGERY     LAPAROSCOPIC ASSISTED VAGINAL HYSTERECTOMY  03/04/2011   Procedure: LAPAROSCOPIC ASSISTED VAGINAL HYSTERECTOMY;  Surgeon: Rosaline LITTIE Cobble, MD;  Location: [facility];  Service: Gynecology;  Laterality: N/A;   LAPAROTOMY N/A 04/18/2022   Procedure: EXPLORATORY LAPAROTOMY;  Surgeon: Vanderbilt Ned, MD;  Location: [facility];  Service: General;   Laterality: N/A;   LAPAROTOMY N/A 04/24/2022   Procedure: BRING BACK EXPLORATORY LAPAROTOMY;  Surgeon: Sebastian Moles, MD;  Location: [facility] OR;  Service: General;  Laterality: N/A;   TOTAL HIP ARTHROPLASTY Right 08/27/2022   Procedure: TOTAL HIP ARTHROPLASTY;  Surgeon: Edna Toribio LABOR, MD;  Location: [facility];  Service: Orthopedics;  Laterality: Right;    Social History:  reports that she has been smoking cigarettes. She has never used smokeless tobacco. She reports current alcohol  use. She reports current drug use. Drugs: Cocaine, Marijuana, and Crack cocaine.  Allergies:  Allergies  Allergen Reactions   Iron  Dextran Shortness Of Breath and Anxiety   Aspirin  Nausea And Vomiting and Other (See Comments)    Ok to take tylenol  or ibuprofen     Penicillins Other (See Comments)    Unknown reaction from childhood: family would like this to remain as an allergy.  Tolerated cephalosporins and zosyn     (Not in a hospital admission)   Blood pressure (!) 143/101, pulse 98, temperature 98.3 F (36.8 C), temperature source Oral, resp. rate (!) 24, height 5' 2 (1.575 m), last menstrual period 01/08/2011, SpO2 97%. Physical Exam:  General: pleasant, WD, chronically ill appearing female who is laying in bed in NAD HEENT: head is normocephalic, atraumatic.  Sclera are mildly icteric  Pupils pinpoint bilaterally.  Ears and nose without any masses or lesions.  Mouth is pink and moist Heart: regular, rate, and rhythm. Palpable radial and pedal pulses bilaterally Lungs: Respiratory effort nonlabored on supplemental O2, audible wheezing Abd: soft, mild to moderate distention, mild left sided abdominal pain without peritonitis or guarding, surgical scars present  MS: all 4 extremities are symmetrical with no cyanosis, clubbing, or edema. Skin: warm and dry with no masses, lesions, or rashes Neuro: non focal exam, follows commands, gait not assessed Psych: A&Ox3 with a blunted affect.   Results for  orders placed or performed during the hospital encounter of 05/28/24 (from the past 48 hours)  CBC with Differential     Status: Abnormal   Collection Time: 05/28/24  4:46 AM  Result Value Ref Range   WBC 6.9 4.0 - 10.5 K/uL   RBC 3.99 3.87 - 5.11 MIL/uL   Hemoglobin 9.1 (L) 12.0 - 15.0 g/dL   HCT 69.3 (L) 63.9 - 53.9 %   MCV 76.7 (L) 80.0 - 100.0 fL   MCH 22.8 (L) 26.0 - 34.0 pg   MCHC 29.7 (L) 30.0 - 36.0 g/dL   RDW 77.9 (H) 88.4 - 84.4 %   Platelets 141 (L) 150 - 400 K/uL   nRBC 0.0 0.0 - 0.2 %   Neutrophils Relative % 62 %   Neutro Abs 4.2 1.7 - 7.7 K/uL   Lymphocytes Relative 28 %   Lymphs Abs 1.9 0.7 - 4.0 K/uL   Monocytes Relative 9 %   Monocytes Absolute  0.6 0.1 - 1.0 K/uL   Eosinophils Relative 1 %   Eosinophils Absolute 0.1 0.0 - 0.5 K/uL   Basophils Relative 0 %   Basophils Absolute 0.0 0.0 - 0.1 K/uL   WBC Morphology MORPHOLOGY UNREMARKABLE    RBC Morphology MORPHOLOGY UNREMARKABLE    Smear Review Normal platelet morphology    Immature Granulocytes 0 %   Abs Immature Granulocytes 0.01 0.00 - 0.07 K/uL    Comment: Performed at [facility], [address], [place], [place] [address]  Comprehensive metabolic panel     Status: Abnormal   Collection Time: 05/28/24  4:46 AM  Result Value Ref Range   Sodium 135 135 - 145 mmol/L   Potassium 4.0 3.5 - 5.1 mmol/L   Chloride 98 98 - 111 mmol/L   CO2 24 22 - 32 mmol/L   Glucose, Bld 162 (H) 70 - 99 mg/dL    Comment: Glucose reference range applies only to samples taken after fasting for at least 8 hours.   BUN <5 (L) 6 - 20 mg/dL   Creatinine, Ser 9.44 0.44 - 1.00 mg/dL   Calcium  8.4 (L) 8.9 - 10.3 mg/dL   Total Protein 6.4 (L) 6.5 - 8.1 g/dL   Albumin  3.1 (L) 3.5 - 5.0 g/dL   AST 25 15 - 41 U/L   ALT 23 0 - 44 U/L   Alkaline Phosphatase 56 38 - 126 U/L   Total Bilirubin 0.5 0.0 - 1.2 mg/dL   GFR, Estimated >39 >39 mL/min    Comment: (NOTE) Calculated using the CKD-EPI Creatinine Equation (2021)    Anion gap  13 5 - 15    Comment: Performed at [facility], [address], [place], [place] [address]  Lipase, blood     Status: None   Collection Time: 05/28/24  4:46 AM  Result Value Ref Range   Lipase 27 11 - 51 U/L    Comment: Performed at [facility], [address], [place], [place] [address]  Urinalysis, Routine w reflex microscopic -Urine, Clean Catch     Status: Abnormal   Collection Time: 05/28/24  4:46 AM  Result Value Ref Range   Color, Urine YELLOW YELLOW   APPearance CLEAR CLEAR   Specific Gravity, Urine 1.043 (H) 1.005 - 1.030   pH 6.0 5.0 - 8.0   Glucose, UA NEGATIVE NEGATIVE mg/dL   Hgb urine dipstick NEGATIVE NEGATIVE   Bilirubin Urine NEGATIVE NEGATIVE   Ketones, ur NEGATIVE NEGATIVE mg/dL   Protein, ur NEGATIVE NEGATIVE mg/dL   Nitrite NEGATIVE NEGATIVE   Leukocytes,Ua NEGATIVE NEGATIVE    Comment: Performed at [facility], [address], [place], [place] [address]  Procalcitonin     Status: None   Collection Time: 05/28/24  4:46 AM  Result Value Ref Range   Procalcitonin 13.96 ng/mL    Comment:        Interpretation: PCT >= 10 ng/mL: Important systemic inflammatory response, almost exclusively due to severe bacterial sepsis or septic shock. (NOTE)       Sepsis PCT Algorithm           Lower Respiratory Tract                                      Infection PCT Algorithm    ----------------------------     ----------------------------  PCT < 0.25 ng/mL                PCT < 0.10 ng/mL          Strongly encourage             Strongly discourage   discontinuation of antibiotics    initiation of antibiotics    ----------------------------     -----------------------------       PCT 0.25 - 0.50 ng/mL            PCT 0.10 - 0.25 ng/mL               OR       >80% decrease in PCT            Discourage initiation of                                            antibiotics      Encourage discontinuation           of antibiotics     ----------------------------     -----------------------------         PCT >= 0.50 ng/mL              PCT 0.26 - 0.50 ng/mL                AND       <80% decrease in PCT             Encourage initiation of                                             antibiotics       Encourage continuation           of antibiotics    ----------------------------     -----------------------------        PCT >= 0.50 ng/mL                  PCT > 0.50 ng/mL               AND         increase in PCT                  Strongly encourage                                      initiation of antibiotics    Strongly encourage escalation           of antibiotics                                     -----------------------------                                           PCT <= 0.25 ng/mL  OR                                        > 80% decrease in PCT                                      Discontinue / Do not initiate                                             antibiotics  Performed at [facility], [address], [place], [place] [address]   Valproic  acid level     Status: Abnormal   Collection Time: 05/28/24  5:42 AM  Result Value Ref Range   Valproic  Acid Lvl <10 (L) 50 - 100 ug/mL    Comment: RESULT CONFIRMED BY MANUAL DILUTION Performed at [facility], [address], [place], [place] [address]    CT ABDOMEN PELVIS W CONTRAST Result Date: 05/28/2024 EXAM: CT ABDOMEN AND PELVIS WITH CONTRAST 05/28/2024 08:48:03 AM TECHNIQUE: CT of the abdomen and pelvis was performed with the administration of 75 mL of iohexol  (OMNIPAQUE ) 350 MG/ML injection. Multiplanar reformatted images are provided for review. Automated exposure control, iterative reconstruction, and/or weight-based adjustment of the mA/kV was utilized to reduce the radiation dose to as low as reasonably achievable. COMPARISON: 05/24/24 CLINICAL HISTORY: Abdominal pain, acute, nonlocalized;  worsening xray. FINDINGS: LOWER CHEST: Areas of subsegmental atelectasis are present within the lingula and in both lower lobes. A patchy area of ground-glass attenuation is noted within the medial left upper lobe. Coronary artery calcifications are present. LIVER: An unchanged, too small to characterize 4 mm hypodensity is noted in the right lobe of the liver. GALLBLADDER AND BILE DUCTS: Gallbladder is unremarkable. Fusiform dilatation of the common bile duct measures up to 8 mm, previously 6 mm. No calcified stone is identified within the common bile duct. SPLEEN: The spleen is within normal limits in size and appearance. PANCREAS: The pancreas is normal in size and contour, without focal lesion or ductal dilatation. ADRENAL GLANDS: No periadrenal stranding is present. No nodule, thickening, or hemorrhage is seen. KIDNEYS, URETERS AND BLADDER: The kidneys are normal in size and morphology bilaterally. No stones in the kidneys or ureters. No hydronephrosis. No perinephric or periureteral stranding. The urinary bladder is obscured by streak artifact from the patient's right hip arthroplasty device. GI AND BOWEL: Circumferential wall thickening of the distal esophagus with a small to moderate hiatal hernia is noted. Postoperative changes from previous gastric bypass surgery are present. No pathologic dilatation of the small bowel loops or signs of bowel inflammation are seen. Moderate colonic stool burden is noted involving the ascending colon. There is progressive gaseous distention of the redundant colon to the level of the descending colon with additional gaseous distention of the sigmoid colon. The distal sigmoid colon and rectum are decreased in caliber. A focal short segment of luminal narrowing at the junction between the descending colon and sigmoid colon is noted in the left hemiabdomen, on coronal image 65/6. There are nondilated loops of small bowel noted lateral to the dilated descending colon, which may  reflect an underlying internal hernia. PERITONEUM AND RETROPERITONEUM: No free fluid or fluid  collections are identified. No free air. VASCULATURE: Aorta is normal in caliber. Aortic atherosclerosis and aortic atherosclerotic calcifications are present. The mesenteric vasculature is patent. LYMPH NODES: No signs of abdominopelvic adenopathy are seen. REPRODUCTIVE ORGANS: The uterus is not visualized and may be surgically absent. No signs of adnexal mass are seen. BONES AND SOFT TISSUES: Previous right hip arthroplasty is noted. An unchanged compression fracture involves the T9 vertebra. A subacute fracture involving the superior endplate of T9 is again seen with mild loss of the superior endplate height. Approximately 4 mm retropulsion of the superior posterior endplate is noted at this level. No new compression fractures are identified. No focal soft tissue abnormality. IMPRESSION: 1. Focal short segment luminal narrowing at the junction between the descending colon and sigmoid colon with associated gaseous distention of the redundant colon and sigmoid colon, and decreased caliber of the distal sigmoid colon and rectum. Nondilated small bowel loops lateral to the dilated descending colon may reflect an underlying internal hernia possibly resulting in partial obstruction of the left colon at the junction between the descending colon and sigmoid colon. 2. Fusiform dilatation of the common bile duct now measuring up to 8 mm, previously 6 mm. No calcified stone is identified. 3. Circumferential wall thickening of the distal esophagus with a small to moderate hiatal hernia. Electronically signed by: Waddell Calk MD 05/28/2024 09:27 AM EST RP Workstation: [id number]   DG Lumbar Spine Complete Result Date: 05/28/2024 EXAM: 4 VIEW(S) XRAY OF THE LUMBAR SPINE 05/28/2024 06:07:44 AM COMPARISON: Lumbar spine MRI 05/25/2024. CLINICAL HISTORY: fall? FINDINGS: LUMBAR SPINE: BONES: Stable appearance of the subacute fracture  deformity involving the superior endplate of the L1 vertebra with less than 25 percent vertebral body height loss. No new fractures identified. The remaining lumbar vertebral body heights are well preserved. Alignment is normal. DISCS AND DEGENERATIVE CHANGES: The lumbar disc spaces are well preserved. No severe degenerative changes. SOFT TISSUES: Diffuse gaseous distention of the colon with moderate right-sided colonic stool burden. Signs of previous right total hip arthroplasty. No acute abnormality. IMPRESSION: 1. Stable subacute fracture deformity involving the superior endplate of the L1 vertebra with less than 25 percent vertebral body height loss. No new fractures identified. Electronically signed by: Waddell Calk MD 05/28/2024 06:18 AM EST RP Workstation: [id number]   DG Abdomen Acute W/Chest Result Date: 05/28/2024 EXAM: UPRIGHT AND SUPINE XRAY VIEWS OF THE ABDOMEN AND 4 VIEW(S) OF THE CHEST 05/28/2024 06:07:44 AM COMPARISON: 05/25/2024 CLINICAL HISTORY: sob, constipation FINDINGS: LUNGS AND PLEURA: Mild elevation of the left hemidiaphragm with mild bibasilar atelectasis. No pleural effusion or pneumothorax. HEART AND MEDIASTINUM: Overlying cardiac leads. No acute abnormality of the cardiac and mediastinal silhouettes. BOWEL: Diffuse gaseous distention of the colon with moderate right-sided colonic stool burden. PERITONEUM AND SOFT TISSUES: No abnormal calcifications. No free air. BONES: Right total hip arthroplasty. No acute fracture. IMPRESSION: 1. Diffuse gaseous distention of the colon with moderate right-sided colonic stool burden. 2. Mild bibasilar atelectasis with mild elevation of the left hemidiaphragm. Electronically signed by: Waddell Calk MD 05/28/2024 06:16 AM EST RP Workstation: [id number]      Assessment/Plan Abdominal pain Possible LBO - hx of gastric bypass in 2017, hx of SBR for stricture in 2023 and takeback for fascial dehiscence in 2023 (closed with rententions) - KUB this  AM with gaseous distention of colon and moderate right colonic stool burden  - CT today with focal short segment luminal narrowing at junction between the descending and sigmoid colon, non-dilated small bowel lateral to  distal descending colon may reflect underlying internal hernia with possible partial obstruction, fusiform dilation of CBD up to 8 mm, no calcified stone, circumferential wall thickening of distal esophagus  - no leukocytosis, afebrile, hypertensive and mildly tachycardic - abdomen distended but without peritonitis, I do not currently suspect internal hernia  - recommend GI consult for consideration of possible flex sig vs colonoscopy to better evaluate - surgery will follow but no emergent surgical intervention recommended at this time  FEN: NPO, IVF per [facility] VTE: [facility] ID: no current abx  - per [facility] -  EtOH abuse - reports 1.5 40 oz daily  COPD HTN HLD Hypothyroidism ?Hx of A. Fib Tobacco abuse Schizophrenia  T2DM Chronic pain  Anxiety Depression  GERD Seizure disorder  I reviewed ED provider notes, last 24 h vitals and pain scores, last 48 h intake and output, last 24 h labs and trends, and last 24 h imaging results.  This care required high  level of medical decision making.   Burnard JONELLE Louder, [facility] 05/28/2024, 11:49 AM Please see Amion for pager number during day hours 7:00am-4:30pm

## 2024-05-28 NOTE — Plan of Care (Signed)

## 2024-05-28 NOTE — ED Triage Notes (Addendum)
 Pt to ED via [facility] from home c/o abd pain, SOB and lower back pain x 24 hours. Last BM this AM. Expiratory wheezing heard by  EMS. 2 alb treatments given by EMS. Initial o2 sats 85% on RA. Pt was ambulatory on scene. 02 as needed at home. HX: COPD 170/96 b/p P: 101 with EMS. BS: 243 mg/dl HX: seizures, DM

## 2024-05-28 NOTE — Consult Note (Addendum)
 Consultation  Referring Provider: TRH/Smith Primary Care Physician:  Medina-Vargas, Monina C, NP Primary Gastroenterologist: Sampson  Reason for Consultation: Question partial bowel obstruction  HPI: [name] is a 58 y.o. female with history of COPD, oxygen  dependence usually on 4 L nasal cannula as needed at home, history of atrial fibrillation, chronic back pain, diabetes mellitus, peripheral neuropathy, bipolar disorder and schizophrenia.  Also with history of EtOH abuse, endorses drinking 240 ounce beers per day. She was brought to the ER today with complaints of shortness of breath, abdominal discomfort, back pain and possible seizure.  Very Apparently she ran out of her seizure medication per the notes. She was recently in the emergency room with complaints of low back pain and diagnosed with an L1 compression fracture.  She has been on pain medication for that at home.  She says her back continues to be painful.  She has been able to have bowel movements but has been passing less stool than usual for her.  She says that she did have a bowel movement today.  She has not noticed any melena or hematochezia.  She has not had nausea or vomiting.  She does feel that her abdomen is a bit more protuberant currently. She is status post hysterectomy, Roux-en-Y gastric bypass 2017, exploratory lap with small bowel resection 2023 for small bowel stricture, and then exploratory lap and abdominal closure with retention sutures in 2023 for fascial dehiscence.  Workup in the ER today with plain abdominal films showing diffuse gaseous distention of the colon with moderate right-sided colonic stool burden, right total hip arthroplasty, no acute fracture Lumbar spine films and stable L1 compression fracture CT abdomen and pelvis with contrast-4 mm density in the right lobe of the liver unchanged too small to characterize gallbladder unremarkable CBD 8 mm there is circumferential wall thickening of  the distal esophagus with small to moderate hiatal hernia, postoperative changes from prior gastric bypass, no pathologic dilation of the small bowel loops or signs of bowel inflammation, There is moderate colonic stool burden involving the ascending colon and progressive distention of redundant colon to the level of the descending colon focal short segment of luminal narrowing at the junction between the descending and sigmoid colon noted cannot rule out internal hernia.  Labs-WBC 6.9/hemoglobin 9.1/hematocrit 30.6/MCV 76.7/platelets 141 Lipase normal Sodium 135/potassium 4/glucose 162/creatinine 0.55 LFTs within normal limits Procalcitonin 13.9  Past Medical History:  Diagnosis Date   Abdominal pain    Accidental drug overdose April 2013   Anxiety    Atrial fibrillation (HCC) 09/29/11   converted spontaneously   Chronic back pain    Chronic knee pain    Chronic nausea    Chronic pain    COPD (chronic obstructive pulmonary disease) (HCC)    Depression    Diabetes mellitus    states her doctor took her off all DM meds in past month   Diabetic neuropathy (HCC)    Dyspnea    with exertion    GERD (gastroesophageal reflux disease)    Headache(784.0)    migraines    HTN (hypertension)    not on meds since in a year    Hyperlipidemia    Hypothyroidism    not on meds in a while    Mental disorder    Bipolar and schizophrenic   Nausea and vomiting 01/02/2023   Requires supplemental oxygen     as needed per patient    Schizophrenia (HCC)    Schizophrenia, acute (HCC) 11/13/2017  Tobacco abuse     Past Surgical History:  Procedure Laterality Date   ABDOMINAL HYSTERECTOMY     BLADDER SUSPENSION  03/04/2011   Procedure: [facility] PROCEDURE;  Surgeon: Glendia LABOR MacDiarmid;  Location: [facility];  Service: Urology;  Laterality: N/A;   BOWEL RESECTION N/A 04/18/2022   Procedure: SMALL BOWEL RESECTION;  Surgeon: Vanderbilt Ned, MD;  Location: [facility];  Service: General;  Laterality: N/A;    CYSTOCELE REPAIR  03/04/2011   Procedure: ANTERIOR REPAIR (CYSTOCELE);  Surgeon: Glendia LABOR Elizabeth;  Location: [facility];  Service: Urology;  Laterality: N/A;   CYSTOSCOPY  03/04/2011   Procedure: CYSTOSCOPY;  Surgeon: Glendia LABOR MacDiarmid;  Location: [facility];  Service: Urology;  Laterality: N/A;   ESOPHAGOGASTRODUODENOSCOPY (EGD) WITH PROPOFOL  N/A 05/12/2017   Procedure: ESOPHAGOGASTRODUODENOSCOPY (EGD) WITH PROPOFOL ;  Surgeon: Ethyl Lenis, MD;  Location: [facility];  Service: General;  Laterality: N/A;   GASTRIC ROUX-EN-Y N/A 03/25/2016   Procedure: LAPAROSCOPIC ROUX-EN-Y GASTRIC BYPASS WITH UPPER ENDOSCOPY;  Surgeon: Morene Olives, MD;  Location: [facility];  Service: General;  Laterality: N/A;   KNEE SURGERY     LAPAROSCOPIC ASSISTED VAGINAL HYSTERECTOMY  03/04/2011   Procedure: LAPAROSCOPIC ASSISTED VAGINAL HYSTERECTOMY;  Surgeon: Rosaline LITTIE Cobble, MD;  Location: [facility];  Service: Gynecology;  Laterality: N/A;   LAPAROTOMY N/A 04/18/2022   Procedure: EXPLORATORY LAPAROTOMY;  Surgeon: Vanderbilt Ned, MD;  Location: [facility];  Service: General;  Laterality: N/A;   LAPAROTOMY N/A 04/24/2022   Procedure: BRING BACK EXPLORATORY LAPAROTOMY;  Surgeon: Sebastian Moles, MD;  Location: [facility];  Service: General;  Laterality: N/A;   TOTAL HIP ARTHROPLASTY Right 08/27/2022   Procedure: TOTAL HIP ARTHROPLASTY;  Surgeon: Edna Toribio LABOR, MD;  Location: [facility];  Service: Orthopedics;  Laterality: Right;    Prior to Admission medications   Medication Sig Start Date End Date Taking? Authorizing Provider  ARIPiprazole  (ABILIFY ) 5 MG tablet Take 1 tablet (5 mg total) by mouth daily. 05/28/24  Yes Simon Lavonia SAILOR, MD  thiamine  (VITAMIN B1) 100 MG tablet Take 1 tablet (100 mg total) by mouth daily. 05/28/24  Yes Simon Lavonia SAILOR, MD  albuterol  (VENTOLIN  HFA) 108 (90 Base) MCG/ACT inhaler Inhale 2 puffs into the lungs every 4 (four) hours as needed for wheezing or shortness of breath. 05/28/24   Simon Lavonia SAILOR, MD   divalproex  (DEPAKOTE  ER) 250 MG 24 hr tablet Take 1 tablet (250 mg total) by mouth daily. 05/28/24   Simon Lavonia SAILOR, MD  famotidine  (PEPCID ) 40 MG tablet Take 1 tablet (40 mg total) by mouth daily. 05/21/24 06/20/24  Kammerer, Duwaine L, DO  ibuprofen  (ADVIL ) 600 MG tablet Take 1 tablet (600 mg total) by mouth every 6 (six) hours as needed for pain 02/25/24     levETIRAcetam  (KEPPRA ) 250 MG tablet Take 1 tablet (250 mg total) by mouth 2 (two) times daily. 05/23/24   Medina-Vargas, Monina C, NP  metFORMIN  (GLUCOPHAGE -XR) 750 MG 24 hr tablet Take 1 tablet (750 mg total) by mouth daily with breakfast. 05/28/24   Simon Lavonia SAILOR, MD  methylPREDNISolone  (MEDROL  DOSEPAK) 4 MG TBPK tablet Follow directions on Dosepak 05/25/24   Tegeler, Lonni PARAS, MD  ondansetron  (ZOFRAN -ODT) 4 MG disintegrating tablet Dissolve 1 tablet (4 mg total) by mouth every 8 (eight) hours as needed for nausea or vomiting. 05/25/24   Tegeler, Lonni PARAS, MD  oxyCODONE -acetaminophen  (PERCOCET/ROXICET) 5-325 MG tablet Take 1 tablet by mouth every 4 (four) hours as needed for severe pain (pain score 7-10). 05/25/24  Tegeler, Lonni PARAS, MD  OXYGEN  Inhale 4-5 L into the lungs See admin instructions. Use every night. May use throughout the day if needed.    [provider]  sucralfate  (CARAFATE ) 1 g tablet Take 1 tablet (1 g total) by mouth in the morning, at noon, and at bedtime for 14 days. 05/21/24 06/04/24  Gennaro Duwaine CROME, DO    Current Facility-Administered Medications  Medication Dose Route Frequency Provider Last Rate Last Admin   0.9 %  sodium chloride  infusion   Intravenous Continuous Claudene Maximino LABOR, MD   Held at 05/28/24 1401   acetaminophen  (TYLENOL ) tablet 650 mg  650 mg Oral Q6H PRN Claudene Maximino LABOR, MD       Or   acetaminophen  (TYLENOL ) suppository 650 mg  650 mg Rectal Q6H PRN Smith, Rondell A, MD       albuterol  (PROVENTIL ) (2.5 MG/3ML) 0.083% nebulizer solution 2.5 mg  2.5 mg Nebulization Q2H PRN Smith,  Rondell A, MD       enoxaparin  (LOVENOX ) injection 40 mg  40 mg Subcutaneous Q24H Smith, Rondell A, MD       folic acid  (FOLVITE ) tablet 1 mg  1 mg Oral Daily Claudene, Rondell A, MD   1 mg at 05/28/24 1151   guaiFENesin  (MUCINEX ) 12 hr tablet 600 mg  600 mg Oral BID Smith, Rondell A, MD   600 mg at 05/28/24 1151   LORazepam  (ATIVAN ) injection 0-4 mg  0-4 mg Intravenous Q6H Smith, Rondell A, MD       Followed by   NOREEN ON 05/30/2024] LORazepam  (ATIVAN ) injection 0-4 mg  0-4 mg Intravenous Q12H Smith, Rondell A, MD       LORazepam  (ATIVAN ) tablet 1-4 mg  1-4 mg Oral Q1H PRN Smith, Rondell A, MD   1 mg at 05/28/24 1251   Or   LORazepam  (ATIVAN ) injection 1-4 mg  1-4 mg Intravenous Q1H PRN Smith, Rondell A, MD       nicotine  (NICODERM CQ  - dosed in mg/24 hours) patch 21 mg  21 mg Transdermal Daily Claudene, Rondell A, MD   21 mg at 05/28/24 1153   ondansetron  (ZOFRAN ) tablet 4 mg  4 mg Oral Q6H PRN Smith, Rondell A, MD       Or   ondansetron  (ZOFRAN ) injection 4 mg  4 mg Intravenous Q6H PRN Claudene Maximino A, MD       pantoprazole  (PROTONIX ) injection 40 mg  40 mg Intravenous Q12H Smith, Rondell A, MD       sodium chloride  flush (NS) 0.9 % injection 3 mL  3 mL Intravenous Q12H Smith, Rondell A, MD       thiamine  (VITAMIN B1) tablet 100 mg  100 mg Oral Daily Claudene, Rondell A, MD   100 mg at 05/28/24 1151   Or   thiamine  (VITAMIN B1) injection 100 mg  100 mg Intravenous Daily Claudene Maximino LABOR, MD       Current Outpatient Medications  Medication Sig Dispense Refill   ARIPiprazole  (ABILIFY ) 5 MG tablet Take 1 tablet (5 mg total) by mouth daily. 30 tablet 1   thiamine  (VITAMIN B1) 100 MG tablet Take 1 tablet (100 mg total) by mouth daily. 30 tablet 1   albuterol  (VENTOLIN  HFA) 108 (90 Base) MCG/ACT inhaler Inhale 2 puffs into the lungs every 4 (four) hours as needed for wheezing or shortness of breath. 18 g 1   divalproex  (DEPAKOTE  ER) 250 MG 24 hr tablet Take 1 tablet (250 mg total) by mouth  daily. 90  tablet 1   famotidine  (PEPCID ) 40 MG tablet Take 1 tablet (40 mg total) by mouth daily. 30 tablet 0   ibuprofen  (ADVIL ) 600 MG tablet Take 1 tablet (600 mg total) by mouth every 6 (six) hours as needed for pain 20 tablet 0   levETIRAcetam  (KEPPRA ) 250 MG tablet Take 1 tablet (250 mg total) by mouth 2 (two) times daily. 180 tablet 3   metFORMIN  (GLUCOPHAGE -XR) 750 MG 24 hr tablet Take 1 tablet (750 mg total) by mouth daily with breakfast. 90 tablet 1   methylPREDNISolone  (MEDROL  DOSEPAK) 4 MG TBPK tablet Follow directions on Dosepak 21 each 0   ondansetron  (ZOFRAN -ODT) 4 MG disintegrating tablet Dissolve 1 tablet (4 mg total) by mouth every 8 (eight) hours as needed for nausea or vomiting. 20 tablet 0   oxyCODONE -acetaminophen  (PERCOCET/ROXICET) 5-325 MG tablet Take 1 tablet by mouth every 4 (four) hours as needed for severe pain (pain score 7-10). 15 tablet 0   OXYGEN  Inhale 4-5 L into the lungs See admin instructions. Use every night. May use throughout the day if needed.     sucralfate  (CARAFATE ) 1 g tablet Take 1 tablet (1 g total) by mouth in the morning, at noon, and at bedtime for 14 days. 42 tablet 0    Allergies as of 05/28/2024 - Reviewed 05/28/2024  Allergen Reaction Noted   Iron  dextran Shortness Of Breath and Anxiety 11/14/2021   Aspirin  Nausea And Vomiting and Other (See Comments) 02/18/2011   Penicillins Other (See Comments) 11/21/2022    Family History  Problem Relation Age of Onset   Diabetes Mother    Heart disease Mother    Hypertension Mother    Heart attack Father        47s   Heart attack Sister        25   COPD Other    Breast cancer Neg Hx     Social History   Socioeconomic History   Marital status: Widowed    Spouse name: Not on file   Number of children: 3   Years of education: Not on file   Highest education level: GED or equivalent  Occupational History   Not on file  Tobacco Use   Smoking status: Every Day    Current packs/day: 1.00    Types:  Cigarettes   Smokeless tobacco: Never  Vaping Use   Vaping status: Never Used  Substance and Sexual Activity   Alcohol  use: Yes    Comment: 80 ounces daily   Drug use: Yes    Types: Cocaine, Marijuana, Crack cocaine    Comment: last use- 03/19/2023   Sexual activity: Not Currently    Partners: Male  Other Topics Concern   Not on file  Social History Narrative   Not on file   Social Drivers of Health   Financial Resource Strain: Low Risk  (02/05/2024)   Overall Financial Resource Strain (CARDIA)    Difficulty of Paying Living Expenses: Not hard at all  Food Insecurity: Food Insecurity Present (05/28/2024)   Hunger Vital Sign    Worried About Running Out of Food in the Last Year: Sometimes true    Ran Out of Food in the Last Year: Sometimes true  Transportation Needs: Unmet Transportation Needs (05/28/2024)   PRAPARE - Transportation    Lack of Transportation (Medical): Yes    Lack of Transportation (Non-Medical): Yes  Physical Activity: Inactive (02/05/2024)   Exercise Vital Sign    Days of Exercise per Week:  0 days    Minutes of Exercise per Session: Not on file  Stress: Stress Concern Present (02/05/2024)   [employer] of Occupational Health - Occupational Stress Questionnaire    Feeling of Stress: Very much  Social Connections: Socially Isolated (02/05/2024)   Social Connection and Isolation Panel    Frequency of Communication with Friends and Family: More than three times a week    Frequency of Social Gatherings with Friends and Family: More than three times a week    Attends Religious Services: Never    Database Administrator or Organizations: No    Attends Engineer, Structural: Not on file    Marital Status: Widowed  Intimate Partner Violence: Not At Risk (05/28/2024)   Humiliation, Afraid, Rape, and Kick questionnaire    Fear of Current or Ex-Partner: No    Emotionally Abused: No    Physically Abused: No    Sexually Abused: No    Review of  Systems: Pertinent positive and negative review of systems were noted in the above HPI section.  All other review of systems was otherwise negative.  Physical Exam: Vital signs in last 24 hours: Temp:  [97.8 F (36.6 C)-98.3 F (36.8 C)] 97.8 F (36.6 C) (12/06 1214) Pulse Rate:  [98-112] 98 (12/06 1247) Resp:  [22-24] 24 (12/06 0811) BP: (143-158)/(84-101) 143/101 (12/06 1247) SpO2:  [85 %-97 %] 97 % (12/06 9188)   General:   Alert,  Well-developed, well-nourished, older African-American female pleasant and cooperative in NAD Head:  Normocephalic and atraumatic. Eyes:  Sclera clear, no icterus.   Conjunctiva pink. Ears:  Normal auditory acuity. Nose:  No deformity, discharge,  or lesions. Mouth:  No deformity or lesions.   Neck:  Supple; no masses or thyromegaly. Lungs: Scattered rhonchi  heart:  Regular rate and rhythm; no murmurs, clicks, rubs,  or gallops. Abdomen:  Soft,, full feeling, no fluid wave.  Some mild tenderness in the right abdomen, no guarding or rebound, bowel sounds are present, large midline incisional scar.   Rectal: Not done Msk:  Symmetrical without gross deformities. . Pulses:  Normal pulses noted. Extremities:  Without clubbing or edema. Neurologic:  Alert and  oriented x4;  grossly normal neurologically. Skin:  Intact without significant lesions or rashes.. Psych:  Alert and cooperative. Normal mood and affect.  Intake/Output from previous day: No intake/output data recorded. Intake/Output this shift: No intake/output data recorded.  Lab Results: Recent Labs    05/25/24 1835 05/28/24 0446  WBC 7.2 6.9  HGB 9.1* 9.1*  HCT 30.0* 30.6*  PLT 204 141*   BMET Recent Labs    05/25/24 1835 05/28/24 0446  NA 134* 135  K 3.4* 4.0  CL 97* 98  CO2 28 24  GLUCOSE 100* 162*  BUN 5* <5*  CREATININE 0.65 0.55  CALCIUM  8.3* 8.4*   LFT Recent Labs    05/28/24 0446  PROT 6.4*  ALBUMIN  3.1*  AST 25  ALT 23  ALKPHOS 56  BILITOT 0.5    PT/INR No results for input(s): LABPROT, INR in the last 72 hours. Hepatitis Panel No results for input(s): HEPBSAG, HCVAB, HEPAIGM, HEPBIGM in the last 72 hours.    IMPRESSION:  #29 58 year old African-American female status post multiple abdominal surgeries including prior hysterectomy, Roux-en-Y gastric bypass, small bowel resection secondary to small bowel stricture 2023, and then exploratory lap and abdominal closure with retention sutures 2023 for fascial dehiscence.  Patient presents to the ER today with complaints of abdominal pain, low  back pain, and shortness of breath as well as possible seizure.  She has had a recently diagnosed L1 compression fracture and has been on pain medication at home for that.  Has persistent pain secondary to L1 fracture.  CT abdomen and pelvis shows primarily colonic distention and a large stool burden in the right colon, there is question of internal hernia or short segment of luminal narrowing at the junction between the descending colon and sigmoid colon in the left hemiabdomen.  These findings may represent just large colonic stool burden/obstipation.  She is not having any severe pain to suggest an acute obstruction.   #2 severe COPD O2 dependent 4 L at home #3 diabetes mellitus with peripheral neuropathy #4 seizure disorder #5.Hypertension  #6.  EtOH abuse #7 bipolar disorder and schizophrenia #8 nonspecific distal esophageal wall thickening likely component of chronic esophagitis  PLAN: Full liquid diet Will start a slow bowel purge for the next 2 days, will start with MiraLAX  prep today Second bowel prep tomorrow evening Will hope to get her bowel purge down be able to proceed with EGD and colonoscopy on Monday with Dr. Leigh. GI will follow with you   Amy Esterwood PA-C 05/28/2024, 2:11 PM   Attending physician's note  I personally saw the patient and performed a substantive portion of the medical decision making  process for this encounter (including a complete performance of the key components : MDM, Hx and Exam), in conjunction with the APP.  I agree with the APP's note, impression, and  the management plan for the number and complexity of problems addressed at the encounter for the patient and take responsibility for that plan with its inherent risk of complications, morbidity, or mortality with additional input as follows.     58 year old female status post multiple abdominal surgeries, Roux-en-Y gastric bypass admitted with worsening low back pain, abdominal pain and shortness of breath.  She has history of COPD on home O2 Recently was in ER with L1 compression fracture and has been taking pain meds  Worsening constipation in the setting of opiates  CT abdomen pelvis showed large amount of retained stool in right colon and colonic distention with questionable narrowing junction of between descending and sigmoid colon. Also noted esophageal wall thickening History of chronic alcohol  abuse  Will plan to proceed with EGD and colonoscopy for further evaluation of abnormal CT findings of GI track (esophagus and colon) Patient will need extended bowel prep Tentatively plan for procedure on Monday PPI twice daily oral Full liquid diet  GI will continue to follow  The patient was provided an opportunity to ask questions and all were answered. The patient agreed with the plan and demonstrated an understanding of the instructions.  LOIS Wilkie Mcgee , MD [phone]

## 2024-05-28 NOTE — H&P (Signed)
 History and Physical    Patient: [name] FMW:[id number] DOB: 1966-01-31 DOA: 05/28/2024 DOS: the patient was seen and examined on 05/28/2024 PCP: [name], NP  Patient coming from: Home via EMS  Chief Complaint:  Chief Complaint  Patient presents with   Shortness of Breath   Abdominal Pain   Back Pain   HPI: [name] is a 58 y.o. female with medical history significant of paroxysmal atrial fibrillation, COPD, diabetes mellitus type 2, seizures, anxiety, depression, and polysubstance abuse presents with lower back pain radiating to the right leg and abdominal pain.  She experiences lower back pain radiating to the right leg, stopping at the knee. The pain is persistent and spans across the lower back.  She also has lower abdominal pain accompanied by nausea and vomiting. She vomited after taking some medication, though she did not specify which one.  She has daily bowel movements, which she attributes to her seizure medication. Her bowel movements are regularly formed, with no constipation or straining.  She experiences difficulty breathing and uses an oxygen  machine at home as needed. She has daily coughing and constant wheezing.  She smokes approximately five and a half packs of cigarettes a day and consumes a forty-ounce beer daily. She has a history of cocaine use, with the last use about a month ago  In the emergency department patient was noted to be afebrile with pulse 110-112, respirations 22-24, blood pressures elevated up to 158/84, and O2 saturations noted to be as low as 85% with improvement on 3 L of nasal cannula oxygen  to greater than 92%.  Labs significant for hemoglobin 9.1, platelets 141, and glucose 162.  Acute abdominal series noted diffuse gaseous distention of the colon with moderate right-sided colonic stool burden.  CT scan of the abdomen pelvis noted focal short segment luminal narrowing concerning for internal hernia possibly resulting  in partial obstruction of the left colon at the junction between the descending colon and sigmoid colon, fusiform dilation of the common bile duct now measuring 8 mm previously 6 mm without calcified stone identified, and circumferential wall thickening of the distal esophagus with small to moderate hiatal hernia.  Of note the patient's daughter is in the process of trying to obtain guardianship.  Review of Systems: As mentioned in the history of present illness. All other systems reviewed and are negative. Past Medical History:  Diagnosis Date   Abdominal pain    Accidental drug overdose April 2013   Anxiety    Atrial fibrillation (HCC) 09/29/11   converted spontaneously   Chronic back pain    Chronic knee pain    Chronic nausea    Chronic pain    COPD (chronic obstructive pulmonary disease) (HCC)    Depression    Diabetes mellitus    states her doctor took her off all DM meds in past month   Diabetic neuropathy (HCC)    Dyspnea    with exertion    GERD (gastroesophageal reflux disease)    Headache(784.0)    migraines    HTN (hypertension)    not on meds since in a year    Hyperlipidemia    Hypothyroidism    not on meds in a while    Mental disorder    Bipolar and schizophrenic   Nausea and vomiting 01/02/2023   Requires supplemental oxygen     as needed per patient    Schizophrenia (HCC)    Schizophrenia, acute (HCC) 11/13/2017   Tobacco abuse  Past Surgical History:  Procedure Laterality Date   ABDOMINAL HYSTERECTOMY     BLADDER SUSPENSION  03/04/2011   Procedure: [facility] PROCEDURE;  Surgeon: Glendia LABOR MacDiarmid;  Location: [facility];  Service: Urology;  Laterality: N/A;   BOWEL RESECTION N/A 04/18/2022   Procedure: SMALL BOWEL RESECTION;  Surgeon: Vanderbilt Ned, MD;  Location: [facility];  Service: General;  Laterality: N/A;   CYSTOCELE REPAIR  03/04/2011   Procedure: ANTERIOR REPAIR (CYSTOCELE);  Surgeon: Glendia LABOR Elizabeth;  Location: [facility];  Service: Urology;  Laterality:  N/A;   CYSTOSCOPY  03/04/2011   Procedure: CYSTOSCOPY;  Surgeon: Glendia LABOR MacDiarmid;  Location: [facility];  Service: Urology;  Laterality: N/A;   ESOPHAGOGASTRODUODENOSCOPY (EGD) WITH PROPOFOL  N/A 05/12/2017   Procedure: ESOPHAGOGASTRODUODENOSCOPY (EGD) WITH PROPOFOL ;  Surgeon: Ethyl Lenis, MD;  Location: [facility];  Service: General;  Laterality: N/A;   GASTRIC ROUX-EN-Y N/A 03/25/2016   Procedure: LAPAROSCOPIC ROUX-EN-Y GASTRIC BYPASS WITH UPPER ENDOSCOPY;  Surgeon: Morene Olives, MD;  Location: [facility];  Service: General;  Laterality: N/A;   KNEE SURGERY     LAPAROSCOPIC ASSISTED VAGINAL HYSTERECTOMY  03/04/2011   Procedure: LAPAROSCOPIC ASSISTED VAGINAL HYSTERECTOMY;  Surgeon: Rosaline LITTIE Cobble, MD;  Location: [facility];  Service: Gynecology;  Laterality: N/A;   LAPAROTOMY N/A 04/18/2022   Procedure: EXPLORATORY LAPAROTOMY;  Surgeon: Vanderbilt Ned, MD;  Location: [facility];  Service: General;  Laterality: N/A;   LAPAROTOMY N/A 04/24/2022   Procedure: BRING BACK EXPLORATORY LAPAROTOMY;  Surgeon: Sebastian Moles, MD;  Location: [facility];  Service: General;  Laterality: N/A;   TOTAL HIP ARTHROPLASTY Right 08/27/2022   Procedure: TOTAL HIP ARTHROPLASTY;  Surgeon: Edna Toribio LABOR, MD;  Location: [facility];  Service: Orthopedics;  Laterality: Right;   Social History:  reports that she has been smoking cigarettes. She has never used smokeless tobacco. She reports current alcohol  use. She reports current drug use. Drugs: Cocaine, Marijuana, and Crack cocaine.  Allergies  Allergen Reactions   Iron  Dextran Shortness Of Breath and Anxiety   Aspirin  Nausea And Vomiting and Other (See Comments)    Ok to take tylenol  or ibuprofen     Penicillins Other (See Comments)    Unknown reaction from childhood: family would like this to remain as an allergy.  Tolerated cephalosporins and zosyn     Family History  Problem Relation Age of Onset   Diabetes Mother    Heart disease Mother    Hypertension Mother     Heart attack Father        32s   Heart attack Sister        56   COPD Other    Breast cancer Neg Hx     Prior to Admission medications   Medication Sig Start Date End Date Taking? Authorizing Provider  ARIPiprazole  (ABILIFY ) 5 MG tablet Take 1 tablet (5 mg total) by mouth daily. 05/28/24  Yes Simon Lavonia SAILOR, MD  thiamine  (VITAMIN B1) 100 MG tablet Take 1 tablet (100 mg total) by mouth daily. 05/28/24  Yes Simon Lavonia SAILOR, MD  albuterol  (VENTOLIN  HFA) 108 [phone] Base) MCG/ACT inhaler Inhale 2 puffs into the lungs every 4 (four) hours as needed for wheezing or shortness of breath. 05/28/24   Lemly, Tatum N, MD  divalproex  (DEPAKOTE  ER) 250 MG 24 hr tablet Take 1 tablet (250 mg total) by mouth daily. 05/28/24   Simon Lavonia SAILOR, MD  famotidine  (PEPCID ) 40 MG tablet Take 1 tablet (40 mg total) by mouth daily. 05/21/24 06/20/24  Kammerer, Megan  L, DO  ibuprofen  (ADVIL ) 600 MG tablet Take 1 tablet (600 mg total) by mouth every 6 (six) hours as needed for pain 02/25/24     levETIRAcetam  (KEPPRA ) 250 MG tablet Take 1 tablet (250 mg total) by mouth 2 (two) times daily. 05/23/24   Medina-Vargas, Monina C, NP  metFORMIN  (GLUCOPHAGE -XR) 750 MG 24 hr tablet Take 1 tablet (750 mg total) by mouth daily with breakfast. 05/28/24   Simon Lavonia SAILOR, MD  methylPREDNISolone  (MEDROL  DOSEPAK) 4 MG TBPK tablet Follow directions on Dosepak 05/25/24   Tegeler, Lonni PARAS, MD  ondansetron  (ZOFRAN -ODT) 4 MG disintegrating tablet Dissolve 1 tablet (4 mg total) by mouth every 8 (eight) hours as needed for nausea or vomiting. 05/25/24   Tegeler, Lonni PARAS, MD  oxyCODONE -acetaminophen  (PERCOCET/ROXICET) 5-325 MG tablet Take 1 tablet by mouth every 4 (four) hours as needed for severe pain (pain score 7-10). 05/25/24   Tegeler, Lonni PARAS, MD  OXYGEN  Inhale 4-5 L into the lungs See admin instructions. Use every night. May use throughout the day if needed.    [provider]  sucralfate  (CARAFATE ) 1 g tablet Take 1 tablet (1  g total) by mouth in the morning, at noon, and at bedtime for 14 days. 05/21/24 06/04/24  Gennaro Duwaine CROME, DO    Physical Exam: Vitals:   05/28/24 0417 05/28/24 0422 05/28/24 0428 05/28/24 0811  BP:  (!) 158/84  (!) 143/101  Pulse: (!) 112 (!) 110  98  Resp:  (!) 22  (!) 24  Temp:   98.3 F (36.8 C) 98.3 F (36.8 C)  TempSrc:   Oral Oral  SpO2: (!) 85% 96%  97%    Constitutional: Elderly female who appears acute distress but rest Eyes: PERRL, lids and conjunctivae normal ENMT: Mucous membranes are moist.    Neck: normal, supple, no masses, no thyromegaly Respiratory: Decreased aeration with mild expiratory wheeze O2 saturation currently in the 80s on room air. Cardiovascular: Regular rate and rhythm, no murmurs / rubs / gallops. No extremity edema.  Abdomen: Mildly distended abdomen with some tenderness to palpation of the left lower quadrant.  Cannot appreciate some bowel sounds. Musculoskeletal: no clubbing / cyanosis. No joint deformity upper and lower extremities. Good ROM, no contractures. Normal muscle tone.  Skin: no rashes, lesions, ulcers. No induration Neurologic: CN 2-12 grossly intact. Sensation intact, DTR normal. Strength 5/5 in all 4.  Psychiatric: Alert and oriented x 3. Normal mood.   Data Reviewed:  Reviewed labs, imaging, pertinent records as documented.  Assessment and Plan:  Abdominal pain Partial bowel obstruction Patient presented with complaints of lower abdominal pain.  CT scan of the abdomen and pelvis noted focal short segment luminal narrowing at junction between the descending and sigmoid colon, non-dilated small bowel lateral to distal descending colon may reflect underlying internal hernia with possible partial obstruction.  General surgery was consulted, but felt symptoms - Admit to a telemetry bed - Monitor intake and output - Aspiration precautions with elevation head of bed - Liquid diet - Bowel prep per GI - Appreciate general surgery  and GI consultative services, will follow-up  for further recommendations  Chronic respiratory failure with hypoxia COPD O2 saturations noted to be as low as 85% on room air.  On physical exam patient noted to have patient reports having oxygen  to use as needed at home.  Chest x-ray noted mild bibasilar atelectasis with elevation of the left hemidiaphragm.  Patient supposed to be on 4 L of oxygen  at baseline. -  Continue nasal cannula oxygen  maintain oxygen  saturation greater than 90% - Breathing treatments as needed  Back pain Thoracic compression fracture Present prior to arrival.  Patient reports significant mid to lower back pain with radiation down the right side.  CT revealed unchanged compression fracture involving T9 vertebrae with subacute fracture involving the superior endplate of T9 with mild loss of the superior endplate height approximately 4 mm retroportion of the superior posterior endplate is noted at the level with no new compression fractures identified.  Patient back pain is likely leading to compression fracture. - Oxycodone  as needed for pain  GERD with esophagitis Hiatal hernia CT scan noted circumferential wall thickening of the distal esophagus with small to moderate hiatal hernia. - Protonix  and Carafate   Thrombocytopenia Acute.  Platelet count noted to be 141.  Possibly related with patient's history of alcohol  use. - Continue to monitor  Seizure disorder - Continue Keppra    Bipolar  Schizophrenia Patient history of frequently visits to emergency room with multiple episodes of leaving AGAINST MEDICAL ADVICE.  Patient has had to be IVC in the past as noted to lack decision-making capacity and be a harm for herself.  Daughter currently in the process of attaining guardianship. - Continue Depakote   Tobacco abuse Patient reports smoking a pack and a half of cigarettes per day on average. - Nicotine  patch offered  Alcohol  abuse Patient reports drinking a forty  per day on average. - CIWA protocols initiated with Ativan  as needed  Cocaine abuse Patient denies recent use in the last month. - Continue to counsel need of cessation of cocaine use   DVT prophylaxis: Advance Care Planning:   Code Status: Full Code    Consults: General surgery, gastroenterology  Family Communication: daughter update  Severity of Illness: The appropriate patient status for this patient is OBSERVATION. Observation status is judged to be reasonable and necessary in order to provide the required intensity of service to ensure the patient's safety. The patient's presenting symptoms, physical exam findings, and initial radiographic and laboratory data in the context of their medical condition is felt to place them at decreased risk for further clinical deterioration. Furthermore, it is anticipated that the patient will be medically stable for discharge from the hospital within 2 midnights of admission.   Author: Maximino DELENA Sharps, MD 05/28/2024 10:13 AM  For on call review [email or web address].

## 2024-05-28 NOTE — ED Notes (Signed)
 Ct notified at this time that pt has PIV.

## 2024-05-28 NOTE — Plan of Care (Signed)

## 2024-05-29 DIAGNOSIS — G40909 Epilepsy, unspecified, not intractable, without status epilepticus: Secondary | ICD-10-CM | POA: Diagnosis present

## 2024-05-29 DIAGNOSIS — J449 Chronic obstructive pulmonary disease, unspecified: Secondary | ICD-10-CM | POA: Diagnosis not present

## 2024-05-29 DIAGNOSIS — Z7984 Long term (current) use of oral hypoglycemic drugs: Secondary | ICD-10-CM | POA: Diagnosis not present

## 2024-05-29 DIAGNOSIS — E1169 Type 2 diabetes mellitus with other specified complication: Secondary | ICD-10-CM | POA: Diagnosis present

## 2024-05-29 DIAGNOSIS — M5441 Lumbago with sciatica, right side: Secondary | ICD-10-CM

## 2024-05-29 DIAGNOSIS — E785 Hyperlipidemia, unspecified: Secondary | ICD-10-CM | POA: Diagnosis present

## 2024-05-29 DIAGNOSIS — K56600 Partial intestinal obstruction, unspecified as to cause: Secondary | ICD-10-CM

## 2024-05-29 DIAGNOSIS — G8929 Other chronic pain: Secondary | ICD-10-CM | POA: Diagnosis present

## 2024-05-29 DIAGNOSIS — F1010 Alcohol abuse, uncomplicated: Secondary | ICD-10-CM

## 2024-05-29 DIAGNOSIS — S22070G Wedge compression fracture of T9-T10 vertebra, subsequent encounter for fracture with delayed healing: Secondary | ICD-10-CM | POA: Diagnosis not present

## 2024-05-29 DIAGNOSIS — D123 Benign neoplasm of transverse colon: Secondary | ICD-10-CM | POA: Diagnosis not present

## 2024-05-29 DIAGNOSIS — K5900 Constipation, unspecified: Secondary | ICD-10-CM | POA: Diagnosis not present

## 2024-05-29 DIAGNOSIS — F17210 Nicotine dependence, cigarettes, uncomplicated: Secondary | ICD-10-CM | POA: Diagnosis present

## 2024-05-29 DIAGNOSIS — Z96641 Presence of right artificial hip joint: Secondary | ICD-10-CM | POA: Diagnosis present

## 2024-05-29 DIAGNOSIS — E1142 Type 2 diabetes mellitus with diabetic polyneuropathy: Secondary | ICD-10-CM | POA: Diagnosis present

## 2024-05-29 DIAGNOSIS — Z833 Family history of diabetes mellitus: Secondary | ICD-10-CM | POA: Diagnosis not present

## 2024-05-29 DIAGNOSIS — K56699 Other intestinal obstruction unspecified as to partial versus complete obstruction: Secondary | ICD-10-CM | POA: Diagnosis not present

## 2024-05-29 DIAGNOSIS — J9611 Chronic respiratory failure with hypoxia: Secondary | ICD-10-CM

## 2024-05-29 DIAGNOSIS — F259 Schizoaffective disorder, unspecified: Secondary | ICD-10-CM

## 2024-05-29 DIAGNOSIS — Z8249 Family history of ischemic heart disease and other diseases of the circulatory system: Secondary | ICD-10-CM | POA: Diagnosis not present

## 2024-05-29 DIAGNOSIS — M4854XA Collapsed vertebra, not elsewhere classified, thoracic region, initial encounter for fracture: Secondary | ICD-10-CM | POA: Diagnosis present

## 2024-05-29 DIAGNOSIS — R4182 Altered mental status, unspecified: Secondary | ICD-10-CM | POA: Diagnosis not present

## 2024-05-29 DIAGNOSIS — D500 Iron deficiency anemia secondary to blood loss (chronic): Secondary | ICD-10-CM | POA: Diagnosis present

## 2024-05-29 DIAGNOSIS — Z720 Tobacco use: Secondary | ICD-10-CM | POA: Diagnosis not present

## 2024-05-29 DIAGNOSIS — F1410 Cocaine abuse, uncomplicated: Secondary | ICD-10-CM | POA: Diagnosis present

## 2024-05-29 DIAGNOSIS — R933 Abnormal findings on diagnostic imaging of other parts of digestive tract: Secondary | ICD-10-CM | POA: Diagnosis not present

## 2024-05-29 DIAGNOSIS — G9341 Metabolic encephalopathy: Secondary | ICD-10-CM | POA: Diagnosis present

## 2024-05-29 DIAGNOSIS — Z9884 Bariatric surgery status: Secondary | ICD-10-CM

## 2024-05-29 DIAGNOSIS — J441 Chronic obstructive pulmonary disease with (acute) exacerbation: Secondary | ICD-10-CM

## 2024-05-29 DIAGNOSIS — K5669 Other partial intestinal obstruction: Secondary | ICD-10-CM | POA: Diagnosis present

## 2024-05-29 DIAGNOSIS — D696 Thrombocytopenia, unspecified: Secondary | ICD-10-CM | POA: Diagnosis present

## 2024-05-29 DIAGNOSIS — D509 Iron deficiency anemia, unspecified: Secondary | ICD-10-CM | POA: Diagnosis not present

## 2024-05-29 DIAGNOSIS — K648 Other hemorrhoids: Secondary | ICD-10-CM | POA: Diagnosis not present

## 2024-05-29 DIAGNOSIS — I480 Paroxysmal atrial fibrillation: Secondary | ICD-10-CM | POA: Diagnosis present

## 2024-05-29 DIAGNOSIS — R109 Unspecified abdominal pain: Secondary | ICD-10-CM | POA: Diagnosis not present

## 2024-05-29 DIAGNOSIS — K2100 Gastro-esophageal reflux disease with esophagitis, without bleeding: Secondary | ICD-10-CM | POA: Diagnosis not present

## 2024-05-29 DIAGNOSIS — I10 Essential (primary) hypertension: Secondary | ICD-10-CM | POA: Diagnosis present

## 2024-05-29 DIAGNOSIS — Z9981 Dependence on supplemental oxygen: Secondary | ICD-10-CM | POA: Diagnosis not present

## 2024-05-29 DIAGNOSIS — F10139 Alcohol abuse with withdrawal, unspecified: Secondary | ICD-10-CM | POA: Diagnosis present

## 2024-05-29 DIAGNOSIS — E039 Hypothyroidism, unspecified: Secondary | ICD-10-CM | POA: Diagnosis present

## 2024-05-29 LAB — COMPREHENSIVE METABOLIC PANEL WITH GFR
ALT: 18 U/L (ref 0–44)
AST: 23 U/L (ref 15–41)
Albumin: 2.8 g/dL — ABNORMAL LOW (ref 3.5–5.0)
Alkaline Phosphatase: 53 U/L (ref 38–126)
Anion gap: 11 (ref 5–15)
BUN: <5 mg/dL — ABNORMAL LOW (ref 6–20)
CO2: 27 mmol/L (ref 22–32)
Calcium: 7.8 mg/dL — ABNORMAL LOW (ref 8.9–10.3)
Chloride: 96 mmol/L — ABNORMAL LOW (ref 98–111)
Creatinine, Ser: 0.61 mg/dL (ref 0.44–1.00)
GFR, Estimated: >60 mL/min (ref >60)
Glucose, Bld: 209 mg/dL — ABNORMAL HIGH (ref 70–99)
Potassium: 3.9 mmol/L (ref 3.5–5.1)
Sodium: 134 mmol/L — ABNORMAL LOW (ref 135–145)
Total Bilirubin: 0.8 mg/dL (ref 0.0–1.2)
Total Protein: 5.9 g/dL — ABNORMAL LOW (ref 6.5–8.1)

## 2024-05-29 LAB — CBC
HCT: 28.9 % — ABNORMAL LOW (ref 36.0–46.0)
Hemoglobin: 8.5 g/dL — ABNORMAL LOW (ref 12.0–15.0)
MCH: 23.1 pg — ABNORMAL LOW (ref 26.0–34.0)
MCHC: 29.4 g/dL — ABNORMAL LOW (ref 30.0–36.0)
MCV: 78.5 fL — ABNORMAL LOW (ref 80.0–100.0)
Platelets: 160 K/uL (ref 150–400)
RBC: 3.68 MIL/uL — ABNORMAL LOW (ref 3.87–5.11)
RDW: 22.6 % — ABNORMAL HIGH (ref 11.5–15.5)
WBC: 4.6 K/uL (ref 4.0–10.5)
nRBC: 0 % (ref 0.0–0.2)

## 2024-05-29 MED ORDER — HALOPERIDOL LACTATE 5 MG/ML IJ SOLN
5.0000 mg | Freq: Once | INTRAMUSCULAR | Status: AC
  Administered 2024-05-30: 5 mg via INTRAVENOUS
  Filled 2024-05-29: qty 1

## 2024-05-29 MED ORDER — LORAZEPAM 2 MG/ML IJ SOLN
2.0000 mg | Freq: Once | INTRAMUSCULAR | Status: AC
  Administered 2024-05-29: 2 mg via INTRAVENOUS
  Filled 2024-05-29: qty 1

## 2024-05-29 MED ORDER — HALOPERIDOL LACTATE 5 MG/ML IJ SOLN
5.0000 mg | Freq: Once | INTRAMUSCULAR | Status: AC | PRN
  Administered 2024-05-29: 5 mg via INTRAVENOUS
  Filled 2024-05-29 (×2): qty 1

## 2024-05-29 MED ORDER — POLYETHYLENE GLYCOL 3350 17 G PO PACK
17.0000 g | PACK | Freq: Two times a day (BID) | ORAL | Status: DC
  Administered 2024-05-29 – 2024-05-31 (×3): 17 g via ORAL
  Filled 2024-05-29 (×4): qty 1

## 2024-05-29 NOTE — Progress Notes (Signed)
 Upon arrival to the unit for night shift, patient was extremely agitated, non-redirectable, wandering around the halls, and attempting to leave the unit. She was not following any commands, and had to be escorted back to her room by several people from the staff. Per day shift RN report, patient gave staff a hard time today as her agitation seemed to be increasing and her ordered Ativan  is not working well to help with her withdrawal symptoms. The medication makes her sleep for just a couple of hours ( 1-2 hours ), and then she starts to become agitated again after it wears off. There was a single dose of Haldol  IV that was ordered last night available to use and it was administered to the patient by the day RN prior shift change. Patient is asleep now and calm after the Haldol  was given to her. Per day RN, this medication seems to work better than the ordered Ativan . Will continue to monitor and make the provider aware of any changes.

## 2024-05-29 NOTE — Progress Notes (Addendum)
 Patient ID: [name], female   DOB: 1965-09-05, 58 y.o.   MRN: [id number]    Progress Note   Subjective   Day # 2 CC; shortness of breath, abdominal pain, back pain in patient with new L1 compression fracture, and COPD with chronic respiratory failure on 3 to 4 L at home  Per nursing notes patient agitated during the night, disoriented-he did have 2 large bowel movements after drinking MiraLAX  prep. Patient's bedside nurse gave history that patient has issues with substance abuse, per her daughter uses crack cocaine, EtOH. She is oriented only to person and hospital this morning, very congested  No new labs this a.m.   Objective   Vital signs in last 24 hours: Temp:  [97.8 F (36.6 C)-98.3 F (36.8 C)] 97.9 F (36.6 C) (12/07 0711) Pulse Rate:  [96-122] 100 (12/07 0711) Resp:  [18] 18 (12/07 0426) BP: (117-152)/(68-101) 147/76 (12/07 0711) SpO2:  [89 %-97 %] 97 % (12/07 0711) Last BM Date : 05/29/24 General:    Older African-American female in NAD-disoriented x 2 Heart: Tachycardic at 100 regular rate and rhythm; no murmurs Lungs: Respirations even scattered rhonchi bilaterally, congested Abdomen:  Soft, nontender and nondistended.  Bowel sounds are present Extremities:  Without edema. Neurologic:  Alert, confused, disoriented x 2   Intake/Output from previous day: 12/06 0701 - 12/07 0700 In: 10  Out: -  Intake/Output this shift: No intake/output data recorded.  Lab Results: Recent Labs    05/28/24 0446  WBC 6.9  HGB 9.1*  HCT 30.6*  PLT 141*   BMET Recent Labs    05/28/24 0446  NA 135  K 4.0  CL 98  CO2 24  GLUCOSE 162*  BUN <5*  CREATININE 0.55  CALCIUM  8.4*   LFT Recent Labs    05/28/24 0446  PROT 6.4*  ALBUMIN  3.1*  AST 25  ALT 23  ALKPHOS 56  BILITOT 0.5   PT/INR No results for input(s): LABPROT, INR in the last 72 hours.  Studies/Results: CT ABDOMEN PELVIS W CONTRAST Result Date: 05/28/2024 EXAM: CT ABDOMEN AND PELVIS WITH  CONTRAST 05/28/2024 08:48:03 AM TECHNIQUE: CT of the abdomen and pelvis was performed with the administration of 75 mL of iohexol  (OMNIPAQUE ) 350 MG/ML injection. Multiplanar reformatted images are provided for review. Automated exposure control, iterative reconstruction, and/or weight-based adjustment of the mA/kV was utilized to reduce the radiation dose to as low as reasonably achievable. COMPARISON: 05/24/24 CLINICAL HISTORY: Abdominal pain, acute, nonlocalized; worsening xray. FINDINGS: LOWER CHEST: Areas of subsegmental atelectasis are present within the lingula and in both lower lobes. A patchy area of ground-glass attenuation is noted within the medial left upper lobe. Coronary artery calcifications are present. LIVER: An unchanged, too small to characterize 4 mm hypodensity is noted in the right lobe of the liver. GALLBLADDER AND BILE DUCTS: Gallbladder is unremarkable. Fusiform dilatation of the common bile duct measures up to 8 mm, previously 6 mm. No calcified stone is identified within the common bile duct. SPLEEN: The spleen is within normal limits in size and appearance. PANCREAS: The pancreas is normal in size and contour, without focal lesion or ductal dilatation. ADRENAL GLANDS: No periadrenal stranding is present. No nodule, thickening, or hemorrhage is seen. KIDNEYS, URETERS AND BLADDER: The kidneys are normal in size and morphology bilaterally. No stones in the kidneys or ureters. No hydronephrosis. No perinephric or periureteral stranding. The urinary bladder is obscured by streak artifact from the patient's right hip arthroplasty device. GI AND BOWEL: Circumferential  wall thickening of the distal esophagus with a small to moderate hiatal hernia is noted. Postoperative changes from previous gastric bypass surgery are present. No pathologic dilatation of the small bowel loops or signs of bowel inflammation are seen. Moderate colonic stool burden is noted involving the ascending colon. There is  progressive gaseous distention of the redundant colon to the level of the descending colon with additional gaseous distention of the sigmoid colon. The distal sigmoid colon and rectum are decreased in caliber. A focal short segment of luminal narrowing at the junction between the descending colon and sigmoid colon is noted in the left hemiabdomen, on coronal image 65/6. There are nondilated loops of small bowel noted lateral to the dilated descending colon, which may reflect an underlying internal hernia. PERITONEUM AND RETROPERITONEUM: No free fluid or fluid collections are identified. No free air. VASCULATURE: Aorta is normal in caliber. Aortic atherosclerosis and aortic atherosclerotic calcifications are present. The mesenteric vasculature is patent. LYMPH NODES: No signs of abdominopelvic adenopathy are seen. REPRODUCTIVE ORGANS: The uterus is not visualized and may be surgically absent. No signs of adnexal mass are seen. BONES AND SOFT TISSUES: Previous right hip arthroplasty is noted. An unchanged compression fracture involves the T9 vertebra. A subacute fracture involving the superior endplate of T9 is again seen with mild loss of the superior endplate height. Approximately 4 mm retropulsion of the superior posterior endplate is noted at this level. No new compression fractures are identified. No focal soft tissue abnormality. IMPRESSION: 1. Focal short segment luminal narrowing at the junction between the descending colon and sigmoid colon with associated gaseous distention of the redundant colon and sigmoid colon, and decreased caliber of the distal sigmoid colon and rectum. Nondilated small bowel loops lateral to the dilated descending colon may reflect an underlying internal hernia possibly resulting in partial obstruction of the left colon at the junction between the descending colon and sigmoid colon. 2. Fusiform dilatation of the common bile duct now measuring up to 8 mm, previously 6 mm. No calcified  stone is identified. 3. Circumferential wall thickening of the distal esophagus with a small to moderate hiatal hernia. Electronically signed by: Waddell Calk MD 05/28/2024 09:27 AM EST RP Workstation: [id number]   DG Lumbar Spine Complete Result Date: 05/28/2024 EXAM: 4 VIEW(S) XRAY OF THE LUMBAR SPINE 05/28/2024 06:07:44 AM COMPARISON: Lumbar spine MRI 05/25/2024. CLINICAL HISTORY: fall? FINDINGS: LUMBAR SPINE: BONES: Stable appearance of the subacute fracture deformity involving the superior endplate of the L1 vertebra with less than 25 percent vertebral body height loss. No new fractures identified. The remaining lumbar vertebral body heights are well preserved. Alignment is normal. DISCS AND DEGENERATIVE CHANGES: The lumbar disc spaces are well preserved. No severe degenerative changes. SOFT TISSUES: Diffuse gaseous distention of the colon with moderate right-sided colonic stool burden. Signs of previous right total hip arthroplasty. No acute abnormality. IMPRESSION: 1. Stable subacute fracture deformity involving the superior endplate of the L1 vertebra with less than 25 percent vertebral body height loss. No new fractures identified. Electronically signed by: Waddell Calk MD 05/28/2024 06:18 AM EST RP Workstation: [id number]   DG Abdomen Acute W/Chest Result Date: 05/28/2024 EXAM: UPRIGHT AND SUPINE XRAY VIEWS OF THE ABDOMEN AND 4 VIEW(S) OF THE CHEST 05/28/2024 06:07:44 AM COMPARISON: 05/25/2024 CLINICAL HISTORY: sob, constipation FINDINGS: LUNGS AND PLEURA: Mild elevation of the left hemidiaphragm with mild bibasilar atelectasis. No pleural effusion or pneumothorax. HEART AND MEDIASTINUM: Overlying cardiac leads. No acute abnormality of the cardiac and mediastinal silhouettes. BOWEL: Diffuse  gaseous distention of the colon with moderate right-sided colonic stool burden. PERITONEUM AND SOFT TISSUES: No abnormal calcifications. No free air. BONES: Right total hip arthroplasty. No acute fracture.  IMPRESSION: 1. Diffuse gaseous distention of the colon with moderate right-sided colonic stool burden. 2. Mild bibasilar atelectasis with mild elevation of the left hemidiaphragm. 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  Presented to the ER yesterday with multiple complaints, low back pain, shortness of breath and abdominal pain  CT abdomen and pelvis showed colonic distention and a large stool burden in the right colon, question short segment of luminal narrowing at the junction between the descending and sigmoid colon in the left hemiabdomen  Patient clinically did not appear to have an acute obstruction.  Suspected she may just be very obstipated.  She did take a MiraLAX  prep last night, vomited once after the prep, then did have 2 large bowel movements.  Abdomen soft and nontender this a.m.  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  Management as per hospitalist service  #4 diabetes mellitus with peripheral neuropathy 5.  History of hypertension 6.  Bipolar disorder and schizophrenia 7 nonspecific distal esophageal wall thickening likely component of chronic esophagitis  Plan; We had initially planned for her to have EGD and colonoscopy tomorrow with additional bowel prep today. Now with new complication of withdrawal symptoms and disorientation, plans for endoscopic evaluation are going to be placed on hold. Will continue her on MiraLAX  twice a day Full liquid diet PPI daily We  will follow peripherally over the next day or 2, hopefully will be able to accomplish further workup later this admission.     Principal Problem:   Partial bowel obstruction (HCC) Active Problems:   COPD with acute exacerbation (HCC)   Abdominal pain   Schizoaffective disorder (HCC)   Seizure disorder (HCC)   Tobacco abuse   Cocaine abuse (HCC)   Low back pain   Alcohol  abuse   Chronic respiratory failure with hypoxia (HCC)   Thoracic compression fracture (HCC)   GERD with esophagitis   Hiatal hernia   Thrombocytopenia     LOS: 0 days   Amy Esterwood PA-C 05/29/2024, 8:14 AM   Attending physician's note   I personally saw the patient and performed a substantive portion of the medical decision making process for this encounter (including a complete performance of the key components : MDM, Hx and Exam), in conjunction with the APP.  I agree with the APP's note, impression, and  the management plan for the number and complexity of problems addressed at the encounter for the patient and take responsibility for that plan with its inherent risk of complications, morbidity, or mortality with additional input as follows.      Patient was agitated overnight, is disoriented, remains confused History of EtOH and crack cocaine abuse  Patient is alert, oriented only to person, unable to hold a conversation, needs assistance drinking cup of water   Patient will need EGD and colonoscopy for further evaluation of abnormal CT findings, she is not obstructed, had 2 bowel  movements overnight. Will defer bowel prep until patient's mental status improves, will reassess tomorrow and decide timing of procedures Continue MiraLAX  twice daily Full liquid diet as tolerated  GI will continue to follow along  I have spent >35 minutes of patient care (this includes precharting, chart review, review of results, face-to-face time used for counseling as well as treatment plan and follow-up. The patient was  provided an opportunity to ask questions and all were answered. The patient agreed with the plan and demonstrated an understanding of the instructions.   LOIS Wilkie Mcgee , MD [phone]

## 2024-05-29 NOTE — Plan of Care (Signed)
  Problem: Education: Goal: Knowledge of General Education information will improve Description: Including pain rating scale, medication(s)/side effects and non-pharmacologic comfort measures Outcome: Not Progressing   Problem: Health Behavior/Discharge Planning: Goal: Ability to manage health-related needs will improve Outcome: Not Progressing   Problem: Clinical Measurements: Goal: Ability to maintain clinical measurements within normal limits will improve Outcome: Not Progressing Goal: Will remain free from infection Outcome: Not Progressing Goal: Diagnostic test results will improve Outcome: Not Progressing Goal: Respiratory complications will improve Outcome: Not Progressing Goal: Cardiovascular complication will be avoided Outcome: Not Progressing   Problem: Activity: Goal: Risk for activity intolerance will decrease Outcome: Not Progressing   Problem: Nutrition: Goal: Adequate nutrition will be maintained Outcome: Not Progressing   Problem: Coping: Goal: Level of anxiety will decrease Outcome: Not Progressing   Problem: Elimination: Goal: Will not experience complications related to bowel motility Outcome: Not Progressing Goal: Will not experience complications related to urinary retention Outcome: Not Progressing   Problem: Pain Managment: Goal: General experience of comfort will improve and/or be controlled Outcome: Not Progressing   Problem: Safety: Goal: Ability to remain free from injury will improve Outcome: Not Progressing   Problem: Skin Integrity: Goal: Risk for impaired skin integrity will decrease Outcome: Not Progressing   Problem: Skin Integrity: Goal: Risk for impaired skin integrity will decrease Outcome: Not Progressing

## 2024-05-29 NOTE — Progress Notes (Signed)
  Patient with SOB and respiratory wheezes. RT was called, prn breathing treatment was administered. Placed on 2 liters [place] for comfort. Will continue to monitor.   Patient is also agitated, Ativan  IV was administered.

## 2024-05-29 NOTE — Progress Notes (Signed)
 Patient was able to have 2 large bowel movements after intervention was done ( see MAR ), will continue to monitor.

## 2024-05-29 NOTE — Progress Notes (Signed)
 Patient agitated, not following commands. Climbing over bed rails, trying to leave he room. PRN IV Ativan  given ( see MAR ). Will continue to monitor.

## 2024-05-29 NOTE — Progress Notes (Signed)
 Progress Note   Patient: [name] FMW:[id number] DOB: 1966/06/16 DOA: 05/28/2024  DOS: the patient was seen and examined on 05/29/2024   Brief hospital course:  58 y.o. female with medical history significant of paroxysmal atrial fibrillation, COPD, diabetes mellitus type 2, seizures, anxiety, depression, and polysubstance abuse presents with lower back pain radiating to the right leg and abdominal pain.   Assessment and Plan:  Concern for partial small bowel obstruction/severe constipation - Initially presenting with abdominal pain, nausea, vomiting.  CT scan showing diffuse stool burden.  GI/surgery consulted.  GI initiating bowel prep with clear liquids.  Patient having multiple bowel movements overnight.  Nausea and vomiting resolved.  Abdomen soft, nontender.  Reportedly pursuing EGD/colonoscopy tomorrow.  Chronic hypoxic respiratory failure/COPD - Baseline O2 3-4 L nasal cannula.  Intermittently compliant.  No active wheezing appreciated.  Appears to be at baseline.  Nebulizers as needed.  Acute metabolic encephalopathy/schizophrenia/bipolar substance abuse - Patient has multiple episodes of leaving AMA.  Has been IVC in the past due to lack of decision making capacity.  Daughter currently in process of obtaining guardianship.  Depakote  on board.  Ativan  as needed.  Alcohol  abuse - Reports drinking a 40 daily.  CIWA on board, vitamin supplementation.  Cocaine abuse - Counseled on cessation.  Seizure disorder - Keppra  on board.  GERD with esophagitis/hiatal hernia - PPI, Carafate  on board.  Thoracic compression fracture (POA) - CT revealed unchanged compression fracture at T9.  Oxy on board.  Goals of care - Pending EGD.  Anticipate DC 1-2 days.   Subjective: Patient resting comfortably this morning.  Denied any nausea, vomiting, abdominal pain.  Did report bowel movements.  Somewhat confused, not oriented, attempting to get up from the bed.  Physical  Exam:  Vitals:   05/29/24 0711 05/29/24 0850 05/29/24 1000 05/29/24 1055  BP: (!) 147/76 126/69 132/73 (!) 140/77  Pulse: 100 97 (!) 102 97  Resp:    18  Temp: 97.9 F (36.6 C)     TempSrc:      SpO2: 97% 100% 97%   Height:        GENERAL:  Alert, pleasant, no acute distress  HEENT:  EOMI CARDIOVASCULAR:  RRR, no murmurs appreciated RESPIRATORY:  Clear to auscultation, no wheezing, rales, or rhonchi GASTROINTESTINAL:  Soft, nontender, nondistended EXTREMITIES:  No LE edema bilaterally NEURO:  No new focal deficits appreciated SKIN:  No rashes noted PSYCH: Impulsive    Data Reviewed:  Imaging Studies: CT ABDOMEN PELVIS W CONTRAST Result Date: 05/28/2024 EXAM: CT ABDOMEN AND PELVIS WITH CONTRAST 05/28/2024 08:48:03 AM TECHNIQUE: CT of the abdomen and pelvis was performed with the administration of 75 mL of iohexol  (OMNIPAQUE ) 350 MG/ML injection. Multiplanar reformatted images are provided for review. Automated exposure control, iterative reconstruction, and/or weight-based adjustment of the mA/kV was utilized to reduce the radiation dose to as low as reasonably achievable. COMPARISON: 05/24/24 CLINICAL HISTORY: Abdominal pain, acute, nonlocalized; worsening xray. FINDINGS: LOWER CHEST: Areas of subsegmental atelectasis are present within the lingula and in both lower lobes. A patchy area of ground-glass attenuation is noted within the medial left upper lobe. Coronary artery calcifications are present. LIVER: An unchanged, too small to characterize 4 mm hypodensity is noted in the right lobe of the liver. GALLBLADDER AND BILE DUCTS: Gallbladder is unremarkable. Fusiform dilatation of the common bile duct measures up to 8 mm, previously 6 mm. No calcified stone is identified within the common bile duct. SPLEEN: The spleen is within normal limits in  size and appearance. PANCREAS: The pancreas is normal in size and contour, without focal lesion or ductal dilatation. ADRENAL GLANDS: No  periadrenal stranding is present. No nodule, thickening, or hemorrhage is seen. KIDNEYS, URETERS AND BLADDER: The kidneys are normal in size and morphology bilaterally. No stones in the kidneys or ureters. No hydronephrosis. No perinephric or periureteral stranding. The urinary bladder is obscured by streak artifact from the patient's right hip arthroplasty device. GI AND BOWEL: Circumferential wall thickening of the distal esophagus with a small to moderate hiatal hernia is noted. Postoperative changes from previous gastric bypass surgery are present. No pathologic dilatation of the small bowel loops or signs of bowel inflammation are seen. Moderate colonic stool burden is noted involving the ascending colon. There is progressive gaseous distention of the redundant colon to the level of the descending colon with additional gaseous distention of the sigmoid colon. The distal sigmoid colon and rectum are decreased in caliber. A focal short segment of luminal narrowing at the junction between the descending colon and sigmoid colon is noted in the left hemiabdomen, on coronal image 65/6. There are nondilated loops of small bowel noted lateral to the dilated descending colon, which may reflect an underlying internal hernia. PERITONEUM AND RETROPERITONEUM: No free fluid or fluid collections are identified. No free air. VASCULATURE: Aorta is normal in caliber. Aortic atherosclerosis and aortic atherosclerotic calcifications are present. The mesenteric vasculature is patent. LYMPH NODES: No signs of abdominopelvic adenopathy are seen. REPRODUCTIVE ORGANS: The uterus is not visualized and may be surgically absent. No signs of adnexal mass are seen. BONES AND SOFT TISSUES: Previous right hip arthroplasty is noted. An unchanged compression fracture involves the T9 vertebra. A subacute fracture involving the superior endplate of T9 is again seen with mild loss of the superior endplate height. Approximately 4 mm retropulsion of  the superior posterior endplate is noted at this level. No new compression fractures are identified. No focal soft tissue abnormality. IMPRESSION: 1. Focal short segment luminal narrowing at the junction between the descending colon and sigmoid colon with associated gaseous distention of the redundant colon and sigmoid colon, and decreased caliber of the distal sigmoid colon and rectum. Nondilated small bowel loops lateral to the dilated descending colon may reflect an underlying internal hernia possibly resulting in partial obstruction of the left colon at the junction between the descending colon and sigmoid colon. 2. Fusiform dilatation of the common bile duct now measuring up to 8 mm, previously 6 mm. No calcified stone is identified. 3. Circumferential wall thickening of the distal esophagus with a small to moderate hiatal hernia. Electronically signed by: Waddell Calk MD 05/28/2024 09:27 AM EST RP Workstation: [id number]   DG Lumbar Spine Complete Result Date: 05/28/2024 EXAM: 4 VIEW(S) XRAY OF THE LUMBAR SPINE 05/28/2024 06:07:44 AM COMPARISON: Lumbar spine MRI 05/25/2024. CLINICAL HISTORY: fall? FINDINGS: LUMBAR SPINE: BONES: Stable appearance of the subacute fracture deformity involving the superior endplate of the L1 vertebra with less than 25 percent vertebral body height loss. No new fractures identified. The remaining lumbar vertebral body heights are well preserved. Alignment is normal. DISCS AND DEGENERATIVE CHANGES: The lumbar disc spaces are well preserved. No severe degenerative changes. SOFT TISSUES: Diffuse gaseous distention of the colon with moderate right-sided colonic stool burden. Signs of previous right total hip arthroplasty. No acute abnormality. IMPRESSION: 1. Stable subacute fracture deformity involving the superior endplate of the L1 vertebra with less than 25 percent vertebral body height loss. No new fractures identified. Electronically signed by: Waddell  Stroud MD 05/28/2024  06:18 AM EST RP Workstation: [id number]   DG Abdomen Acute W/Chest Result Date: 05/28/2024 EXAM: UPRIGHT AND SUPINE XRAY VIEWS OF THE ABDOMEN AND 4 VIEW(S) OF THE CHEST 05/28/2024 06:07:44 AM COMPARISON: 05/25/2024 CLINICAL HISTORY: sob, constipation FINDINGS: LUNGS AND PLEURA: Mild elevation of the left hemidiaphragm with mild bibasilar atelectasis. No pleural effusion or pneumothorax. HEART AND MEDIASTINUM: Overlying cardiac leads. No acute abnormality of the cardiac and mediastinal silhouettes. BOWEL: Diffuse gaseous distention of the colon with moderate right-sided colonic stool burden. PERITONEUM AND SOFT TISSUES: No abnormal calcifications. No free air. BONES: Right total hip arthroplasty. No acute fracture. IMPRESSION: 1. Diffuse gaseous distention of the colon with moderate right-sided colonic stool burden. 2. Mild bibasilar atelectasis with mild elevation of the left hemidiaphragm. Electronically signed by: Waddell Calk MD 05/28/2024 06:16 AM EST RP Workstation: [id number]   DG Knee Complete 4 Views Right Result Date: 05/25/2024 CLINICAL DATA:  Fall. EXAM: RIGHT KNEE - COMPLETE 4+ VIEW COMPARISON:  None Available. FINDINGS: No evidence of fracture, dislocation, or joint effusion. There is moderate tricompartmental osteoarthrosis with joint space narrowing and osteophyte formation. Soft tissues are unremarkable. IMPRESSION: 1. No acute fracture or dislocation. 2. Moderate tricompartmental osteoarthrosis. Electronically Signed   By: Greig Pique M.D.   On: 05/25/2024 22:36   DG Chest 2 View Result Date: 05/25/2024 CLINICAL DATA:  Chest pain after fall EXAM: DG CHEST 2V COMPARISON:  Chest x-ray 05/21/2024 FINDINGS: The heart size and mediastinal contours are within normal limits. Both lungs are clear. The visualized skeletal structures are unremarkable. IMPRESSION: No active cardiopulmonary disease. Electronically Signed   By: Greig Pique M.D.   On: 05/25/2024 22:35   CT Cervical Spine Wo  Contrast Result Date: 05/25/2024 CLINICAL DATA:  Status post trauma. EXAM: CT CERVICAL SPINE WITHOUT CONTRAST TECHNIQUE: Multidetector CT imaging of the cervical spine was performed without intravenous contrast. Multiplanar CT image reconstructions were also generated. RADIATION DOSE REDUCTION: This exam was performed according to the departmental dose-optimization program which includes automated exposure control, adjustment of the mA and/or kV according to patient size and/or use of iterative reconstruction technique. COMPARISON:  Oct 29, 2023 FINDINGS: Alignment: There is straightening of the normal cervical spine lordosis. Skull base and vertebrae: No acute fracture. No primary bone lesion or focal pathologic process. Soft tissues and spinal canal: No prevertebral fluid or swelling. No visible canal hematoma. Disc levels: Mild endplate sclerosis, anterior osteophyte formation and posterior bony spurring are seen at the levels of C3-C4, C4-C5 and C5-C6. Mild multilevel intervertebral disc space narrowing is seen throughout all levels of the cervical spine, slightly more prominent at the level of C2-C3. Mild, bilateral multilevel facet joint hypertrophy is noted. Upper chest: Negative. Other: None. IMPRESSION: 1. No acute fracture or subluxation in the cervical spine. 2. Mild multilevel degenerative changes. Electronically Signed   By: Suzen Dials M.D.   On: 05/25/2024 22:04   CT Head Wo Contrast Result Date: 05/25/2024 CLINICAL DATA:  Status post fall EXAM: CT HEAD WITHOUT CONTRAST TECHNIQUE: Contiguous axial images were obtained from the base of the skull through the vertex without intravenous contrast. RADIATION DOSE REDUCTION: This exam was performed according to the departmental dose-optimization program which includes automated exposure control, adjustment of the mA and/or kV according to patient size and/or use of iterative reconstruction technique. COMPARISON:  None Available. FINDINGS: Brain: No  evidence of acute infarction, hemorrhage, hydrocephalus, extra-axial collection or mass lesion/mass effect. Vascular: Mild bilateral cavernous carotid artery calcification is noted. Skull: Normal.  Negative for fracture or focal lesion. Sinuses/Orbits: No acute finding. Other: None. IMPRESSION: No acute intracranial abnormality. Electronically Signed   By: Suzen Dials M.D.   On: 05/25/2024 22:01   MR LUMBAR SPINE WO CONTRAST Result Date: 05/25/2024 EXAM: MRI LUMBAR SPINE 05/25/2024 07:55:00 PM TECHNIQUE: Multiplanar multisequence MRI of the lumbar spine was performed without the administration of intravenous contrast. COMPARISON: CT abdomen and pelvis from 06/13/2024, lumbar spine radiograph 01/2024. CLINICAL HISTORY: Recent lumbar fracture with new numbness to right leg and falls. FINDINGS: BONES AND ALIGNMENT: Normal alignment except for findings at L1. There is a recent likely subacute wedge compression fracture of L1 with less than 25% height loss and approximately 2 mm of retropulsion of the posterior superior corner. Mild bone marrow edema is present at L1. Normal vertebral body heights otherwise. Bone marrow signal is unremarkable otherwise. SPINAL CORD: The conus terminates normally. SOFT TISSUES: No paraspinal mass. L1-L2: No significant disc herniation. No spinal canal stenosis or neural foraminal narrowing. L2-L3: No significant disc herniation. No spinal canal stenosis or neural foraminal narrowing. L3-L4: No significant disc herniation. No spinal canal stenosis or neural foraminal narrowing. L4-L5: Small disc bulge and moderate facet hypertrophy. No central spinal canal or neural foraminal stenosis. L5-S1: No significant disc herniation. Mild facet hypertrophy. No central spinal canal or neural foraminal stenosis. IMPRESSION: 1. Recent, likely subacute wedge compression fracture of L1 with less than 25% height loss, approximately 2 mm of retropulsion of the posterior superior corner, and mild  bone marrow edema. Electronically signed by: Franky Stanford MD 05/25/2024 08:56 PM EST RP Workstation: [id number]   CT ABDOMEN PELVIS W CONTRAST Result Date: 05/24/2024 EXAM: CT ABDOMEN AND PELVIS WITH CONTRAST 05/24/2024 08:42:08 AM TECHNIQUE: CT of the abdomen and pelvis was performed with the administration of 80 mL of iohexol  (OMNIPAQUE ) 300 MG/ML solution. Multiplanar reformatted images are provided for review. Automated exposure control, iterative reconstruction, and/or weight-based adjustment of the mA/kV was utilized to reduce the radiation dose to as low as reasonably achievable. COMPARISON: Recent CT abdomen and pelvis 05/21/2024, and earlier. CLINICAL HISTORY: 58 year old female. Abdominal pain, acute, nonlocalized; Abdominal/flank pain, stone suspected. FINDINGS: LOWER CHEST: Mild lung base atelectasis, overall lung base ventilation has not significantly changed. LIVER: The liver is unremarkable. GALLBLADDER AND BILE DUCTS: Gallbladder is unremarkable. No biliary ductal dilatation. SPLEEN: No acute abnormality. PANCREAS: No acute abnormality. ADRENAL GLANDS: No acute abnormality. KIDNEYS, URETERS AND BLADDER: No stones in the kidneys or ureters. No hydronephrosis. No perinephric or periureteral stranding. Urinary bladder is unremarkable. 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 The mid transverse colon is more gas distended today, but otherwise stable bowel loops, no evidence of mechanical obstruction. No free air, free fluid, or mesenteric inflammation identified. PERITONEUM AND RETROPERITONEUM: No ascites. No free air. No free fluid. No mesenteric inflammation. VASCULATURE: Abdominal aortic atherosclerosis is moderate. Major arterial and portal venous structures in the abdomen and pelvis appear patent. Aorta is normal in caliber. LYMPH NODES: No lymphadenopathy. REPRODUCTIVE ORGANS: Streak artifact again limits visualization of the deep  pelvis. 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 Acute or subacute mild L1 compression fracture. L1 superior endplate compression fracture is new. Mild superior endplate deformity. L1 now measures 19.3 mm versus 22.8 mm normally. This is a 15% loss of vertebral body height. Subtle retropulsion of the posterior superior endplate on  series 9 image 94. No significant spinal stenosis or complicating features. 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 No other acute osseous abnormality identified. IMPRESSION: 1. Acute mild L1 compression fracture with 15% loss of height. No significant retropulsion or other complicating features. 2. No other acute or inflammatory process identified in the abdomen or pelvis. 3. Stable roux-en-y type gastric bypass. Calcified aortic atherosclerosis. Electronically signed by: Helayne Hurst MD 05/24/2024 08:52 AM EST RP Workstation: [id number]   CT ABDOMEN PELVIS W CONTRAST Result Date: 05/21/2024 CLINICAL DATA:  Upper abdominal pain for 3 days with nausea and vomiting for 2 days. EXAM: CT ABDOMEN AND PELVIS WITH CONTRAST TECHNIQUE: Multidetector CT imaging of the abdomen and pelvis was performed using the standard protocol following bolus administration of intravenous contrast. RADIATION DOSE REDUCTION: This exam was performed according to the departmental dose-optimization program which includes automated exposure control, adjustment of the mA and/or kV according to patient size and/or use of iterative reconstruction technique. CONTRAST:  75mL OMNIPAQUE  IOHEXOL  350 MG/ML SOLN COMPARISON:  03/29/2024 and older exams. FINDINGS: Lower chest: Clear lung bases. Hepatobiliary: No focal liver abnormality is seen. No gallstones, gallbladder wall thickening, or biliary dilatation. Pancreas: Unremarkable. No  pancreatic ductal dilatation or surrounding inflammatory changes. Spleen: Normal in size without focal abnormality. Adrenals/Urinary Tract: Normal adrenal glands. Normal kidneys and ureters. Bladder decompressed. Stomach/Bowel: Previous gastric bypass surgery. No evidence of bowel obstruction. No stomach or bowel wall thickening or inflammation. Vascular/Lymphatic: Aortic atherosclerosis. No aneurysm. No enlarged lymph nodes. Reproductive: Status post hysterectomy. No adnexal masses. Other: No hernia.  No ascites. Musculoskeletal: Chronic compression fracture of T9. No acute fracture. No bone lesion. 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 IMPRESSION: 1. No acute findings. 2. Previous Roux-en-Y gastric bypass, without evidence of bowel obstruction or inflammation and unchanged from the prior CT. 3. Aortic atherosclerosis. Electronically Signed   By: Alm Parkins M.D.   On: 05/21/2024 11:06   DG Chest 1 View Result Date: 05/21/2024 CLINICAL DATA:  Cough.  Shortness of breath. EXAM: CHEST  1 VIEW COMPARISON:  02/03/2024 FINDINGS: The heart size and mediastinal contours are within normal limits. Both lungs are clear. The visualized skeletal structures are unremarkable. IMPRESSION: No active disease. Electronically Signed   By: Norleen DELENA Kil M.D.   On: 05/21/2024 09:42    There are no new results to review at this time.  Previous records (including but not limited to H&P, progress notes, nursing notes, TOC management) were reviewed in assessment of this patient.  Labs: CBC: Recent Labs  Lab 05/24/24 0041 05/24/24 0758 05/25/24 1835 05/28/24 0446  WBC 9.9 10.0 7.2 6.9  NEUTROABS 8.3* 8.0* 5.1 4.2  HGB 9.9* 9.9* 9.1* 9.1*  HCT 32.5* 31.9* 30.0* 30.6*  MCV 71.9* 72.0* 73.3* 76.7*  PLT 216 226 204 141*   Basic Metabolic Panel: Recent Labs  Lab 05/24/24 0041 05/24/24 0758 05/25/24 1835 05/28/24 0446  NA 133* 134* 134* 135  K 3.3* 3.4* 3.4* 4.0  CL 95* 94* 97*  98  CO2 25 26 28 24   GLUCOSE 179* 158* 100* 162*  BUN 5* <5* 5* <5*  CREATININE 0.72 0.53 0.65 0.55  CALCIUM  8.5* 8.6* 8.3* 8.4*   Liver Function Tests: Recent Labs  Lab 05/24/24 0041 05/24/24 0758 05/25/24 1835 05/28/24 0446  AST 42* 60* 73* 25  ALT 29 27 36 23  ALKPHOS 86 112 60 56  BILITOT 2.1* 2.4* 0.9 0.5  PROT 7.2 7.4 6.2*  6.4*  ALBUMIN  3.7 4.3 3.1* 3.1*   CBG: No results for input(s): GLUCAP in the last 168 hours.  Scheduled Meds:  divalproex   250 mg Oral Daily   enoxaparin  (LOVENOX ) injection  40 mg Subcutaneous Q24H   folic acid   1 mg Oral Daily   guaiFENesin   600 mg Oral BID   levETIRAcetam   250 mg Oral BID   LORazepam   0-4 mg Intravenous Q6H   Followed by   NOREEN ON 05/30/2024] LORazepam   0-4 mg Intravenous Q12H   nicotine   21 mg Transdermal Daily   pantoprazole  (PROTONIX ) IV  40 mg Intravenous Q12H   polyethylene glycol  17 g Oral BID   sodium chloride  flush  3 mL Intravenous Q12H   sucralfate   1 g Oral TID WC & HS   thiamine   100 mg Oral Daily   Or   thiamine   100 mg Intravenous Daily   Continuous Infusions: PRN Meds:.acetaminophen  **OR** acetaminophen , albuterol , haloperidol  lactate, LORazepam  **OR** LORazepam , ondansetron  **OR** ondansetron  (ZOFRAN ) IV, oxyCODONE -acetaminophen   Family Communication: None at bedside  Disposition: Status is: Observation The patient remains OBS appropriate and will d/c before 2 midnights.     Time spent: 36 minutes  Length of inpatient stay: 0 days  Author: Carliss LELON Canales, DO 05/29/2024 11:41 AM  For on call review [email or web address].

## 2024-05-29 NOTE — Hospital Course (Signed)
 58 y.o. female with medical history significant of paroxysmal atrial fibrillation, COPD, diabetes mellitus type 2, seizures, anxiety, depression, and polysubstance abuse presents with lower back pain radiating to the right leg and abdominal pain.    Assessment and Plan:   Concern for partial small bowel obstruction/severe constipation - Initially presenting with abdominal pain, nausea, vomiting.  CT scan showing diffuse stool burden.  GI/surgery consulted.  GI initiating bowel prep with clear liquids.  Patient having multiple bowel movements overnight.  Nausea and vomiting resolved.  Abdomen soft, nontender.  Reportedly pursuing EGD/colonoscopy tomorrow.   Chronic hypoxic respiratory failure/COPD - Baseline O2 3-4 L nasal cannula.  Intermittently compliant.  No active wheezing appreciated.  Appears to be at baseline.  Nebulizers as needed.   Acute metabolic encephalopathy/schizophrenia/bipolar substance abuse - Patient has multiple episodes of leaving AMA.  Has been IVC in the past due to lack of decision making capacity.  Daughter currently in process of obtaining guardianship.  Depakote  on board.  Ativan  as needed.   Alcohol  abuse - Reports drinking a 40 daily.  CIWA on board, vitamin supplementation.   Cocaine abuse - Counseled on cessation.   Seizure disorder - Keppra  on board.   GERD with esophagitis/hiatal hernia - PPI, Carafate  on board.   Thoracic compression fracture (POA) - CT revealed unchanged compression fracture at T9.  Oxy on board.   Goals of care - Pending EGD.  Anticipate DC 1-2 days.

## 2024-05-29 NOTE — Progress Notes (Signed)
 Patient extremely impulsive, restless, and anxious during the night. Removed her PIV once, not following commands, confused and attempting to jump out of her bed. Ordered Ativan  had to be administered during multiple occasions with no success. Provider was contacted, extra dose of 2 mg of Ativan  was ordered and administered to the patient. Will continue to monitor.

## 2024-05-29 NOTE — Progress Notes (Signed)
 Medications brought from home by the patient ( PO Percocet, Ventolin  Inhaler and Zofran  PO ) were sent to the pharmacy. Consent was signed by the patient. Copy was placed inside patient's chart.

## 2024-05-30 DIAGNOSIS — K2100 Gastro-esophageal reflux disease with esophagitis, without bleeding: Secondary | ICD-10-CM

## 2024-05-30 DIAGNOSIS — K56600 Partial intestinal obstruction, unspecified as to cause: Secondary | ICD-10-CM | POA: Diagnosis not present

## 2024-05-30 DIAGNOSIS — J9611 Chronic respiratory failure with hypoxia: Secondary | ICD-10-CM | POA: Diagnosis not present

## 2024-05-30 DIAGNOSIS — R109 Unspecified abdominal pain: Secondary | ICD-10-CM | POA: Diagnosis not present

## 2024-05-30 LAB — BASIC METABOLIC PANEL WITH GFR
Anion gap: 6 (ref 5–15)
BUN: <5 mg/dL — ABNORMAL LOW (ref 6–20)
CO2: 36 mmol/L — ABNORMAL HIGH (ref 22–32)
Calcium: 8.3 mg/dL — ABNORMAL LOW (ref 8.9–10.3)
Chloride: 101 mmol/L (ref 98–111)
Creatinine, Ser: 0.57 mg/dL (ref 0.44–1.00)
GFR, Estimated: >60 mL/min (ref >60)
Glucose, Bld: 102 mg/dL — ABNORMAL HIGH (ref 70–99)
Potassium: 4 mmol/L (ref 3.5–5.1)
Sodium: 143 mmol/L (ref 135–145)

## 2024-05-30 LAB — CBC
HCT: 31.4 % — ABNORMAL LOW (ref 36.0–46.0)
Hemoglobin: 9.2 g/dL — ABNORMAL LOW (ref 12.0–15.0)
MCH: 22.8 pg — ABNORMAL LOW (ref 26.0–34.0)
MCHC: 29.3 g/dL — ABNORMAL LOW (ref 30.0–36.0)
MCV: 77.9 fL — ABNORMAL LOW (ref 80.0–100.0)
Platelets: 189 K/uL (ref 150–400)
RBC: 4.03 MIL/uL (ref 3.87–5.11)
RDW: 22.8 % — ABNORMAL HIGH (ref 11.5–15.5)
WBC: 6 K/uL (ref 4.0–10.5)
nRBC: 0 % (ref 0.0–0.2)

## 2024-05-30 LAB — MAGNESIUM: Magnesium: 2.1 mg/dL (ref 1.7–2.4)

## 2024-05-30 LAB — IRON AND TIBC
Iron: 21 ug/dL — ABNORMAL LOW (ref 28–170)
Saturation Ratios: 5 % — ABNORMAL LOW (ref 10.4–31.8)
TIBC: 428 ug/dL (ref 250–450)
UIBC: 407 ug/dL

## 2024-05-30 LAB — FERRITIN: Ferritin: 23 ng/mL (ref 11–307)

## 2024-05-30 MED ORDER — HALOPERIDOL LACTATE 5 MG/ML IJ SOLN
5.0000 mg | Freq: Once | INTRAMUSCULAR | Status: AC
  Administered 2024-05-30: 5 mg via INTRAVENOUS
  Filled 2024-05-30: qty 1

## 2024-05-30 NOTE — Progress Notes (Signed)
 Daily Progress Note  DOA: 05/28/2024 Hospital Day: 3  Cc: abnormal colon and esophagus on imaging  ASSESSMENT    58 yo female with a history of multiple abdominal surgeries including a Roux-en-Y gastric bypass a small bowel small resection ( for stricture) admitted with several issues including back pain , lumbar compression fraction,  possible partial colonic obstruction secondary to internal hernia, and also SHOB  ? Colonic obstruction Surgery evaluated, doubts colonic obstruction and has signed off.  Abnormal esophagus on CT scan  Circumferential wall thickening of distal esophagus. 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  DM2  Paroxysmal atrial fibrillation  Seizures   Principal Problem:   Partial bowel obstruction (HCC) Active Problems:   COPD with acute exacerbation (HCC)   Abdominal pain   Schizoaffective disorder (HCC)   Seizure disorder (HCC)   Slow transit constipation   Tobacco abuse   Cocaine abuse (HCC)   Low back pain   Alcohol  abuse   Chronic respiratory failure with hypoxia (HCC)   Thoracic compression fracture (HCC)   GERD with esophagitis   Hiatal hernia   Thrombocytopenia   Colon stricture (HCC)   Abnormal CT scan, gastrointestinal tract   H/O gastric bypass    PLAN   --Continue BID MIralax  --Continue BID Pantoprazole  --When mental status and pulmonary status improve she will need EGD and colonoscopy to evaluate CT scan findings and iron  deficiency anemia --Repeat iron  studies ( ferritin was 5 in May 2025). If iron  deficient then given IV iron ).      Subjective   Complains of back pain   Objective   GI Studies:    Recent Labs    05/28/24 0446 05/29/24 1058 05/30/24 0811  WBC 6.9 4.6 6.0  HGB 9.1* 8.5* 9.2*  HCT  30.6* 28.9* 31.4*  MCV 76.7* 78.5* 77.9*  PLT 141* 160 189   No results for input(s): FOLATE, VITAMINB12, FERRITIN, TIBC, IRONPCTSAT in the last 72 hours. Recent Labs    05/28/24 0446 05/29/24 1058  NA 135 134*  K 4.0 3.9  CL 98 96*  CO2 24 27  GLUCOSE 162* 209*  BUN <5* <5*  CREATININE 0.55 0.61  CALCIUM  8.4* 7.8*   Recent Labs    05/28/24 0446 05/29/24 1058  PROT 6.4* 5.9*  ALBUMIN  3.1* 2.8*  AST 25 23  ALT 23 18  ALKPHOS 56 53  BILITOT 0.5 0.8      Imaging:  CT ABDOMEN PELVIS W CONTRAST EXAM: CT ABDOMEN AND PELVIS WITH CONTRAST 05/28/2024 08:48:03 AM  TECHNIQUE: CT of the abdomen and pelvis was performed with the administration of 75 mL of iohexol  (OMNIPAQUE ) 350 MG/ML injection. Multiplanar reformatted images are provided for review. Automated exposure control, iterative reconstruction, and/or weight-based adjustment of the mA/kV was utilized to reduce the radiation dose to as low as reasonably achievable.  COMPARISON: 05/24/24  CLINICAL HISTORY: Abdominal pain, acute, nonlocalized; worsening xray.  FINDINGS:  LOWER CHEST: Areas of subsegmental atelectasis are present within the lingula and in both lower lobes. A patchy area of ground-glass attenuation is noted within the medial left upper lobe. Coronary artery calcifications are present.  LIVER: An unchanged, too small to characterize 4 mm hypodensity is noted in the right lobe of the liver.  GALLBLADDER AND BILE DUCTS: Gallbladder is unremarkable. Fusiform dilatation  of the common bile duct measures up to 8 mm, previously 6 mm. No calcified stone is identified within the common bile duct.  SPLEEN: The spleen is within normal limits in size and appearance.  PANCREAS: The pancreas is normal in size and contour, without focal lesion or ductal dilatation.  ADRENAL GLANDS: No periadrenal stranding is present. No nodule, thickening, or hemorrhage is seen.  KIDNEYS, URETERS AND  BLADDER: The kidneys are normal in size and morphology bilaterally. No stones in the kidneys or ureters. No hydronephrosis. No perinephric or periureteral stranding. The urinary bladder is obscured by streak artifact from the patient's right hip arthroplasty device.  GI AND BOWEL: Circumferential wall thickening of the distal esophagus with a small to moderate hiatal hernia is noted.  Postoperative changes from previous gastric bypass surgery are present. No pathologic dilatation of the small bowel loops or signs of bowel inflammation are seen.  Moderate colonic stool burden is noted involving the ascending colon. There is progressive gaseous distention of the redundant colon to the level of the descending colon with additional gaseous distention of the sigmoid colon. The distal sigmoid colon and rectum are decreased in caliber. A focal short segment of luminal narrowing at the junction between the descending colon and sigmoid colon is noted in the left hemiabdomen, on coronal image 65/6. There are nondilated loops of small bowel noted lateral to the dilated descending colon, which may reflect an underlying internal hernia.  PERITONEUM AND RETROPERITONEUM: No free fluid or fluid collections are identified. No free air.  VASCULATURE: Aorta is normal in caliber. Aortic atherosclerosis and aortic atherosclerotic calcifications are present. The mesenteric vasculature is patent.  LYMPH NODES: No signs of abdominopelvic adenopathy are seen.  REPRODUCTIVE ORGANS: The uterus is not visualized and may be surgically absent. No signs of adnexal mass are seen.  BONES AND SOFT TISSUES: Previous right hip arthroplasty is noted. An unchanged compression fracture involves the T9 vertebra. A subacute fracture involving the superior endplate of T9 is again seen with mild loss of the superior endplate height. Approximately 4 mm retropulsion of the superior posterior endplate is noted at this  level. No new compression fractures are identified. No focal soft tissue abnormality.  IMPRESSION: 1. Focal short segment luminal narrowing at the junction between the descending colon and sigmoid colon with associated gaseous distention of the redundant colon and sigmoid colon, and decreased caliber of the distal sigmoid colon and rectum. Nondilated small bowel loops lateral to the dilated descending colon may reflect an underlying internal hernia possibly resulting in partial obstruction of the left colon at the junction between the descending colon and sigmoid colon. 2. Fusiform dilatation of the common bile duct now measuring up to 8 mm, previously 6 mm. No calcified stone is identified. 3. Circumferential wall thickening of the distal esophagus with a small to moderate hiatal hernia.  Electronically signed by: Waddell Calk MD 05/28/2024 09:27 AM EST RP Workstation: [id number] DG Lumbar Spine Complete EXAM: 4 VIEW(S) XRAY OF THE LUMBAR SPINE 05/28/2024 06:07:44 AM  COMPARISON: Lumbar spine MRI 05/25/2024.  CLINICAL HISTORY: fall?  FINDINGS:  LUMBAR SPINE: BONES: Stable appearance of the subacute fracture deformity involving the superior endplate of the L1 vertebra with less than 25 percent vertebral body height loss. No new fractures identified. The remaining lumbar vertebral body heights are well preserved. Alignment is normal.  DISCS AND DEGENERATIVE CHANGES: The lumbar disc spaces are well preserved. No severe degenerative changes.  SOFT TISSUES: Diffuse gaseous distention of the colon with  moderate right-sided colonic stool burden. Signs of previous right total hip arthroplasty. No acute abnormality.  IMPRESSION: 1. Stable subacute fracture deformity involving the superior endplate of the L1 vertebra with less than 25 percent vertebral body height loss. No new fractures identified.  Electronically signed by: Waddell Calk MD 05/28/2024 06:18 AM EST  RP Workstation: [id number] DG Abdomen Acute W/Chest EXAM: UPRIGHT AND SUPINE XRAY VIEWS OF THE ABDOMEN AND 4 VIEW(S) OF THE CHEST 05/28/2024 06:07:44 AM  COMPARISON: 05/25/2024  CLINICAL HISTORY: sob, constipation  FINDINGS:  LUNGS AND PLEURA: Mild elevation of the left hemidiaphragm with mild bibasilar atelectasis. No pleural effusion or pneumothorax.  HEART AND MEDIASTINUM: Overlying cardiac leads. No acute abnormality of the cardiac and mediastinal silhouettes.  BOWEL: Diffuse gaseous distention of the colon with moderate right-sided colonic stool burden.  PERITONEUM AND SOFT TISSUES: No abnormal calcifications. No free air.  BONES: Right total hip arthroplasty. No acute fracture.  IMPRESSION: 1. Diffuse gaseous distention of the colon with moderate right-sided colonic stool burden. 2. Mild bibasilar atelectasis with mild elevation of the left hemidiaphragm.  Electronically signed by: Waddell Calk MD 05/28/2024 06:16 AM EST RP Workstation: [id number]     Scheduled inpatient medications:   divalproex   250 mg Oral Daily   enoxaparin  (LOVENOX ) injection  40 mg Subcutaneous Q24H   folic acid   1 mg Oral Daily   guaiFENesin   600 mg Oral BID   levETIRAcetam   250 mg Oral BID   LORazepam   0-4 mg Intravenous Q6H   Followed by   LORazepam   0-4 mg Intravenous Q12H   nicotine   21 mg Transdermal Daily   pantoprazole  (PROTONIX ) IV  40 mg Intravenous Q12H   polyethylene glycol  17 g Oral BID   sodium chloride  flush  3 mL Intravenous Q12H   sucralfate   1 g Oral TID WC & HS   thiamine   100 mg Oral Daily   Or   thiamine   100 mg Intravenous Daily   Continuous inpatient infusions:  PRN inpatient medications: acetaminophen  **OR** acetaminophen , albuterol , LORazepam  **OR** LORazepam , ondansetron  **OR** ondansetron  (ZOFRAN ) IV, oxyCODONE -acetaminophen   Vital signs in last 24 hours: Temp:  [98.1 F (36.7 C)-98.2 F (36.8 C)] 98.2 F (36.8 C) (12/08 0736) Pulse Rate:   [79-105] 86 (12/08 0747) Resp:  [8-18] 8 (12/08 0736) BP: (120-152)/(59-102) 121/65 (12/08 0736) SpO2:  [83 %-100 %] 94 % (12/08 0747) Last BM Date : 05/30/24  Intake/Output Summary (Last 24 hours) at 05/30/2024 0921 Last data filed at 05/29/2024 1953 Gross per 24 hour  Intake 3 ml  Output --  Net 3 ml    Intake/Output from previous day: 12/07 0701 - 12/08 0700 In: 3  Out: -  Intake/Output this shift: No intake/output data recorded.   Physical Exam:  General: Alert female in NAD Heart:  Regular rate and rhythm.  Pulmonary: Rhonchi, wheezing throughout all lung fields.  Normal respiratory effort Abdomen: Soft, nondistended, nontender. Normal bowel sounds. Extremities: No lower extremity edema  Neurologic: Alert, confused but follows commands Psych: Pleasant. Cooperative     LOS: 1 day   Vina Dasen ,NP 05/30/2024, 9:21 AM

## 2024-05-30 NOTE — Progress Notes (Signed)
 Subjective/Chief Complaint: Remains confused Had more BM's   Objective: Vital signs in last 24 hours: Temp:  [98.1 F (36.7 C)-98.2 F (36.8 C)] 98.2 F (36.8 C) (12/08 0736) Pulse Rate:  [79-105] 86 (12/08 0747) Resp:  [8-18] 8 (12/08 0736) BP: (120-152)/(59-102) 121/65 (12/08 0736) SpO2:  [83 %-100 %] 94 % (12/08 0747) Last BM Date : 05/30/24  Intake/Output from previous day: 12/07 0701 - 12/08 0700 In: 3  Out: -  Intake/Output this shift: No intake/output data recorded.  Exam: Awake, confused Abdomen soft, mildly full, non-tender  Lab Results:  Recent Labs    05/28/24 0446 05/29/24 1058  WBC 6.9 4.6  HGB 9.1* 8.5*  HCT 30.6* 28.9*  PLT 141* 160   BMET Recent Labs    05/28/24 0446 05/29/24 1058  NA 135 134*  K 4.0 3.9  CL 98 96*  CO2 24 27  GLUCOSE 162* 209*  BUN <5* <5*  CREATININE 0.55 0.61  CALCIUM  8.4* 7.8*   PT/INR No results for input(s): LABPROT, INR in the last 72 hours. ABG No results for input(s): PHART, HCO3 in the last 72 hours.  Invalid input(s): PCO2, PO2  Studies/Results: CT ABDOMEN PELVIS W CONTRAST Result Date: 05/28/2024 EXAM: CT ABDOMEN AND PELVIS WITH CONTRAST 05/28/2024 08:48:03 AM TECHNIQUE: CT of the abdomen and pelvis was performed with the administration of 75 mL of iohexol  (OMNIPAQUE ) 350 MG/ML injection. Multiplanar reformatted images are provided for review. Automated exposure control, iterative reconstruction, and/or weight-based adjustment of the mA/kV was utilized to reduce the radiation dose to as low as reasonably achievable. COMPARISON: 05/24/24 CLINICAL HISTORY: Abdominal pain, acute, nonlocalized; worsening xray. FINDINGS: LOWER CHEST: Areas of subsegmental atelectasis are present within the lingula and in both lower lobes. A patchy area of ground-glass attenuation is noted within the medial left upper lobe. Coronary artery calcifications are present. LIVER: An unchanged, too small to characterize 4 mm  hypodensity is noted in the right lobe of the liver. GALLBLADDER AND BILE DUCTS: Gallbladder is unremarkable. Fusiform dilatation of the common bile duct measures up to 8 mm, previously 6 mm. No calcified stone is identified within the common bile duct. SPLEEN: The spleen is within normal limits in size and appearance. PANCREAS: The pancreas is normal in size and contour, without focal lesion or ductal dilatation. ADRENAL GLANDS: No periadrenal stranding is present. No nodule, thickening, or hemorrhage is seen. KIDNEYS, URETERS AND BLADDER: The kidneys are normal in size and morphology bilaterally. No stones in the kidneys or ureters. No hydronephrosis. No perinephric or periureteral stranding. The urinary bladder is obscured by streak artifact from the patient's right hip arthroplasty device. GI AND BOWEL: Circumferential wall thickening of the distal esophagus with a small to moderate hiatal hernia is noted. Postoperative changes from previous gastric bypass surgery are present. No pathologic dilatation of the small bowel loops or signs of bowel inflammation are seen. Moderate colonic stool burden is noted involving the ascending colon. There is progressive gaseous distention of the redundant colon to the level of the descending colon with additional gaseous distention of the sigmoid colon. The distal sigmoid colon and rectum are decreased in caliber. A focal short segment of luminal narrowing at the junction between the descending colon and sigmoid colon is noted in the left hemiabdomen, on coronal image 65/6. There are nondilated loops of small bowel noted lateral to the dilated descending colon, which may reflect an underlying internal hernia. PERITONEUM AND RETROPERITONEUM: No free fluid or fluid collections are identified. No free  air. VASCULATURE: Aorta is normal in caliber. Aortic atherosclerosis and aortic atherosclerotic calcifications are present. The mesenteric vasculature is patent. LYMPH NODES: No  signs of abdominopelvic adenopathy are seen. REPRODUCTIVE ORGANS: The uterus is not visualized and may be surgically absent. No signs of adnexal mass are seen. BONES AND SOFT TISSUES: Previous right hip arthroplasty is noted. An unchanged compression fracture involves the T9 vertebra. A subacute fracture involving the superior endplate of T9 is again seen with mild loss of the superior endplate height. Approximately 4 mm retropulsion of the superior posterior endplate is noted at this level. No new compression fractures are identified. No focal soft tissue abnormality. IMPRESSION: 1. Focal short segment luminal narrowing at the junction between the descending colon and sigmoid colon with associated gaseous distention of the redundant colon and sigmoid colon, and decreased caliber of the distal sigmoid colon and rectum. Nondilated small bowel loops lateral to the dilated descending colon may reflect an underlying internal hernia possibly resulting in partial obstruction of the left colon at the junction between the descending colon and sigmoid colon. 2. Fusiform dilatation of the common bile duct now measuring up to 8 mm, previously 6 mm. No calcified stone is identified. 3. Circumferential wall thickening of the distal esophagus with a small to moderate hiatal hernia. Electronically signed by: Waddell Calk MD 05/28/2024 09:27 AM EST RP Workstation: [id number]    Anti-infectives: Anti-infectives (From admission, onward)    None       Assessment/Plan: Abdominal pain Possible LBO - hx of gastric bypass in 2017, hx of SBR for stricture in 2023 and takeback for fascial dehiscence in 2023 (closed with rententions) - KUB yesterday with gaseous distention of colon and moderate right colonic stool burden  - CT  with focal short segment luminal narrowing at junction between the descending and sigmoid colon, non-dilated small bowel lateral to distal descending colon may reflect underlying internal hernia with  possible partial obstruction, fusiform dilation of CBD up to 8 mm, no calcified stone, circumferential wall thickening of distal esophagus   -patient had more BM's -WBC remains normal -doubt large bowel obstruction at this point.  Pt likely obstipated given multiple medical issues.  Agree with GI on holding on endoscopy -ok to advance diet from a surgical standpoint. No plans for surgical intervention at this point Surgery will sign off for now.  Please call back if needed.  Moderate medical decision making  Vicenta Poli MD 05/30/2024

## 2024-05-30 NOTE — Progress Notes (Addendum)
 Progress Note   Patient: [name] FMW:[id number] DOB: 02-25-1966 DOA: 05/28/2024  DOS: the patient was seen and examined on 05/30/2024   Brief hospital course:  58 y.o. female with medical history significant of paroxysmal atrial fibrillation, COPD, diabetes mellitus type 2, seizures, anxiety, depression, and polysubstance abuse presents with lower back pain radiating to the right leg and abdominal pain.    Assessment and Plan:   Concern for partial small bowel obstruction/severe constipation - Initially presenting with abdominal pain, nausea, vomiting.  CT scan showing diffuse stool burden.  GI/surgery consulted.  GI initiating bowel prep with clear liquids.  Patient having multiple bowel movements overnight.  Nausea and vomiting resolved.  Abdomen soft, nontender.  Reportedly pursuing EGD/colonoscopy although currently limited by patient's behaviors, encephalopathy.   Chronic hypoxic respiratory failure/COPD - Baseline O2 3-4 L nasal cannula.  Intermittently compliant.  No active wheezing appreciated.  Appears to be at baseline.  Nebulizers as needed.   Acute metabolic encephalopathy/schizophrenia/bipolar substance abuse -Possibly exacerbated by alcohol  withdrawal.  Patient has multiple episodes of leaving AMA.  Known schizoaffective disorder, bipolar, schizophrenia, substance-induced mood disorder/psychosis, alcohol  use disorder severe, polysubstance abuse (I.e., though largely crack cocaine).  Has been IVC in the past due to lack of decision making capacity.  Daughter currently in process of obtaining guardianship.  Depakote  on board.  Ativan  as needed.   Alcohol  abuse - Reports drinking a 40 daily.  May be contributing to agitation and confusion along with underlying psych disorders.  CIWA on board, vitamin supplementation.   Cocaine abuse - Counseled on cessation.   Seizure disorder - Keppra  on board.   GERD with esophagitis/hiatal hernia - PPI, Carafate  on board.    Thoracic compression fracture (POA) - CT revealed unchanged compression fracture at T9.  Oxy on board.   Goals of care - Pending EGD.  Anticipate DC 1-2 days.   Subjective: Patient resting in bed, alert, talkative.  Lethargic, not oriented.  Denies fever, chills, nausea, vomiting, abdominal pain.  Reports of being confused, wandering the halls, mild agitation, improved with Ativan .  Physical Exam:  Vitals:   05/30/24 0441 05/30/24 0736 05/30/24 0747 05/30/24 0759  BP: 137/77 121/65    Pulse: 81 88 86   Resp: 18 (!) 8    Temp: 98.1 F (36.7 C) 98.2 F (36.8 C)    TempSrc: Axillary Oral    SpO2: 93% (!) 83% 94% 95%  Height:        GENERAL:  Alert, pleasant, no acute distress  HEENT:  EOMI CARDIOVASCULAR:  RRR, no murmurs appreciated RESPIRATORY:  Clear to auscultation, no wheezing, rales, or rhonchi GASTROINTESTINAL:  Soft, nontender, nondistended EXTREMITIES:  No LE edema bilaterally NEURO:  No new focal deficits appreciated SKIN:  No rashes noted PSYCH: Impulsive   Data Reviewed:  Imaging Studies: CT ABDOMEN PELVIS W CONTRAST Result Date: 05/28/2024 EXAM: CT ABDOMEN AND PELVIS WITH CONTRAST 05/28/2024 08:48:03 AM TECHNIQUE: CT of the abdomen and pelvis was performed with the administration of 75 mL of iohexol  (OMNIPAQUE ) 350 MG/ML injection. Multiplanar reformatted images are provided for review. Automated exposure control, iterative reconstruction, and/or weight-based adjustment of the mA/kV was utilized to reduce the radiation dose to as low as reasonably achievable. COMPARISON: 05/24/24 CLINICAL HISTORY: Abdominal pain, acute, nonlocalized; worsening xray. FINDINGS: LOWER CHEST: Areas of subsegmental atelectasis are present within the lingula and in both lower lobes. A patchy area of ground-glass attenuation is noted within the medial left upper lobe. Coronary artery calcifications are present. LIVER: An  unchanged, too small to characterize 4 mm hypodensity is noted in the  right lobe of the liver. GALLBLADDER AND BILE DUCTS: Gallbladder is unremarkable. Fusiform dilatation of the common bile duct measures up to 8 mm, previously 6 mm. No calcified stone is identified within the common bile duct. SPLEEN: The spleen is within normal limits in size and appearance. PANCREAS: The pancreas is normal in size and contour, without focal lesion or ductal dilatation. ADRENAL GLANDS: No periadrenal stranding is present. No nodule, thickening, or hemorrhage is seen. KIDNEYS, URETERS AND BLADDER: The kidneys are normal in size and morphology bilaterally. No stones in the kidneys or ureters. No hydronephrosis. No perinephric or periureteral stranding. The urinary bladder is obscured by streak artifact from the patient's right hip arthroplasty device. GI AND BOWEL: Circumferential wall thickening of the distal esophagus with a small to moderate hiatal hernia is noted. Postoperative changes from previous gastric bypass surgery are present. No pathologic dilatation of the small bowel loops or signs of bowel inflammation are seen. Moderate colonic stool burden is noted involving the ascending colon. There is progressive gaseous distention of the redundant colon to the level of the descending colon with additional gaseous distention of the sigmoid colon. The distal sigmoid colon and rectum are decreased in caliber. A focal short segment of luminal narrowing at the junction between the descending colon and sigmoid colon is noted in the left hemiabdomen, on coronal image 65/6. There are nondilated loops of small bowel noted lateral to the dilated descending colon, which may reflect an underlying internal hernia. PERITONEUM AND RETROPERITONEUM: No free fluid or fluid collections are identified. No free air. VASCULATURE: Aorta is normal in caliber. Aortic atherosclerosis and aortic atherosclerotic calcifications are present. The mesenteric vasculature is patent. LYMPH NODES: No signs of abdominopelvic  adenopathy are seen. REPRODUCTIVE ORGANS: The uterus is not visualized and may be surgically absent. No signs of adnexal mass are seen. BONES AND SOFT TISSUES: Previous right hip arthroplasty is noted. An unchanged compression fracture involves the T9 vertebra. A subacute fracture involving the superior endplate of T9 is again seen with mild loss of the superior endplate height. Approximately 4 mm retropulsion of the superior posterior endplate is noted at this level. No new compression fractures are identified. No focal soft tissue abnormality. IMPRESSION: 1. Focal short segment luminal narrowing at the junction between the descending colon and sigmoid colon with associated gaseous distention of the redundant colon and sigmoid colon, and decreased caliber of the distal sigmoid colon and rectum. Nondilated small bowel loops lateral to the dilated descending colon may reflect an underlying internal hernia possibly resulting in partial obstruction of the left colon at the junction between the descending colon and sigmoid colon. 2. Fusiform dilatation of the common bile duct now measuring up to 8 mm, previously 6 mm. No calcified stone is identified. 3. Circumferential wall thickening of the distal esophagus with a small to moderate hiatal hernia. Electronically signed by: Waddell Calk MD 05/28/2024 09:27 AM EST RP Workstation: [id number]   DG Lumbar Spine Complete Result Date: 05/28/2024 EXAM: 4 VIEW(S) XRAY OF THE LUMBAR SPINE 05/28/2024 06:07:44 AM COMPARISON: Lumbar spine MRI 05/25/2024. CLINICAL HISTORY: fall? FINDINGS: LUMBAR SPINE: BONES: Stable appearance of the subacute fracture deformity involving the superior endplate of the L1 vertebra with less than 25 percent vertebral body height loss. No new fractures identified. The remaining lumbar vertebral body heights are well preserved. Alignment is normal. DISCS AND DEGENERATIVE CHANGES: The lumbar disc spaces are well preserved. No severe  degenerative  changes. SOFT TISSUES: Diffuse gaseous distention of the colon with moderate right-sided colonic stool burden. Signs of previous right total hip arthroplasty. No acute abnormality. IMPRESSION: 1. Stable subacute fracture deformity involving the superior endplate of the L1 vertebra with less than 25 percent vertebral body height loss. No new fractures identified. Electronically signed by: Waddell Calk MD 05/28/2024 06:18 AM EST RP Workstation: [id number]   DG Abdomen Acute W/Chest Result Date: 05/28/2024 EXAM: UPRIGHT AND SUPINE XRAY VIEWS OF THE ABDOMEN AND 4 VIEW(S) OF THE CHEST 05/28/2024 06:07:44 AM COMPARISON: 05/25/2024 CLINICAL HISTORY: sob, constipation FINDINGS: LUNGS AND PLEURA: Mild elevation of the left hemidiaphragm with mild bibasilar atelectasis. No pleural effusion or pneumothorax. HEART AND MEDIASTINUM: Overlying cardiac leads. No acute abnormality of the cardiac and mediastinal silhouettes. BOWEL: Diffuse gaseous distention of the colon with moderate right-sided colonic stool burden. PERITONEUM AND SOFT TISSUES: No abnormal calcifications. No free air. BONES: Right total hip arthroplasty. No acute fracture. IMPRESSION: 1. Diffuse gaseous distention of the colon with moderate right-sided colonic stool burden. 2. Mild bibasilar atelectasis with mild elevation of the left hemidiaphragm. Electronically signed by: Waddell Calk MD 05/28/2024 06:16 AM EST RP Workstation: [id number]   DG Knee Complete 4 Views Right Result Date: 05/25/2024 CLINICAL DATA:  Fall. EXAM: RIGHT KNEE - COMPLETE 4+ VIEW COMPARISON:  None Available. FINDINGS: No evidence of fracture, dislocation, or joint effusion. There is moderate tricompartmental osteoarthrosis with joint space narrowing and osteophyte formation. Soft tissues are unremarkable. IMPRESSION: 1. No acute fracture or dislocation. 2. Moderate tricompartmental osteoarthrosis. Electronically Signed   By: Greig Pique M.D.   On: 05/25/2024 22:36   DG Chest 2  View Result Date: 05/25/2024 CLINICAL DATA:  Chest pain after fall EXAM: DG CHEST 2V COMPARISON:  Chest x-ray 05/21/2024 FINDINGS: The heart size and mediastinal contours are within normal limits. Both lungs are clear. The visualized skeletal structures are unremarkable. IMPRESSION: No active cardiopulmonary disease. Electronically Signed   By: Greig Pique M.D.   On: 05/25/2024 22:35   CT Cervical Spine Wo Contrast Result Date: 05/25/2024 CLINICAL DATA:  Status post trauma. EXAM: CT CERVICAL SPINE WITHOUT CONTRAST TECHNIQUE: Multidetector CT imaging of the cervical spine was performed without intravenous contrast. Multiplanar CT image reconstructions were also generated. RADIATION DOSE REDUCTION: This exam was performed according to the departmental dose-optimization program which includes automated exposure control, adjustment of the mA and/or kV according to patient size and/or use of iterative reconstruction technique. COMPARISON:  Oct 29, 2023 FINDINGS: Alignment: There is straightening of the normal cervical spine lordosis. Skull base and vertebrae: No acute fracture. No primary bone lesion or focal pathologic process. Soft tissues and spinal canal: No prevertebral fluid or swelling. No visible canal hematoma. Disc levels: Mild endplate sclerosis, anterior osteophyte formation and posterior bony spurring are seen at the levels of C3-C4, C4-C5 and C5-C6. Mild multilevel intervertebral disc space narrowing is seen throughout all levels of the cervical spine, slightly more prominent at the level of C2-C3. Mild, bilateral multilevel facet joint hypertrophy is noted. Upper chest: Negative. Other: None. IMPRESSION: 1. No acute fracture or subluxation in the cervical spine. 2. Mild multilevel degenerative changes. Electronically Signed   By: Suzen Dials M.D.   On: 05/25/2024 22:04   CT Head Wo Contrast Result Date: 05/25/2024 CLINICAL DATA:  Status post fall EXAM: CT HEAD WITHOUT CONTRAST TECHNIQUE:  Contiguous axial images were obtained from the base of the skull through the vertex without intravenous contrast. RADIATION DOSE REDUCTION: This exam was performed  according to the departmental dose-optimization program which includes automated exposure control, adjustment of the mA and/or kV according to patient size and/or use of iterative reconstruction technique. COMPARISON:  None Available. FINDINGS: Brain: No evidence of acute infarction, hemorrhage, hydrocephalus, extra-axial collection or mass lesion/mass effect. Vascular: Mild bilateral cavernous carotid artery calcification is noted. Skull: Normal. Negative for fracture or focal lesion. Sinuses/Orbits: No acute finding. Other: None. IMPRESSION: No acute intracranial abnormality. Electronically Signed   By: Suzen Dials M.D.   On: 05/25/2024 22:01   MR LUMBAR SPINE WO CONTRAST Result Date: 05/25/2024 EXAM: MRI LUMBAR SPINE 05/25/2024 07:55:00 PM TECHNIQUE: Multiplanar multisequence MRI of the lumbar spine was performed without the administration of intravenous contrast. COMPARISON: CT abdomen and pelvis from 06/13/2024, lumbar spine radiograph 01/2024. CLINICAL HISTORY: Recent lumbar fracture with new numbness to right leg and falls. FINDINGS: BONES AND ALIGNMENT: Normal alignment except for findings at L1. There is a recent likely subacute wedge compression fracture of L1 with less than 25% height loss and approximately 2 mm of retropulsion of the posterior superior corner. Mild bone marrow edema is present at L1. Normal vertebral body heights otherwise. Bone marrow signal is unremarkable otherwise. SPINAL CORD: The conus terminates normally. SOFT TISSUES: No paraspinal mass. L1-L2: No significant disc herniation. No spinal canal stenosis or neural foraminal narrowing. L2-L3: No significant disc herniation. No spinal canal stenosis or neural foraminal narrowing. L3-L4: No significant disc herniation. No spinal canal stenosis or neural foraminal  narrowing. L4-L5: Small disc bulge and moderate facet hypertrophy. No central spinal canal or neural foraminal stenosis. L5-S1: No significant disc herniation. Mild facet hypertrophy. No central spinal canal or neural foraminal stenosis. IMPRESSION: 1. Recent, likely subacute wedge compression fracture of L1 with less than 25% height loss, approximately 2 mm of retropulsion of the posterior superior corner, and mild bone marrow edema. Electronically signed by: Franky Stanford MD 05/25/2024 08:56 PM EST RP Workstation: [id number]   CT ABDOMEN PELVIS W CONTRAST Result Date: 05/24/2024 EXAM: CT ABDOMEN AND PELVIS WITH CONTRAST 05/24/2024 08:42:08 AM TECHNIQUE: CT of the abdomen and pelvis was performed with the administration of 80 mL of iohexol  (OMNIPAQUE ) 300 MG/ML solution. Multiplanar reformatted images are provided for review. Automated exposure control, iterative reconstruction, and/or weight-based adjustment of the mA/kV was utilized to reduce the radiation dose to as low as reasonably achievable. COMPARISON: Recent CT abdomen and pelvis 05/21/2024, and earlier. CLINICAL HISTORY: 58 year old female. Abdominal pain, acute, nonlocalized; Abdominal/flank pain, stone suspected. FINDINGS: LOWER CHEST: Mild lung base atelectasis, overall lung base ventilation has not significantly changed. LIVER: The liver is unremarkable. GALLBLADDER AND BILE DUCTS: Gallbladder is unremarkable. No biliary ductal dilatation. SPLEEN: No acute abnormality. PANCREAS: No acute abnormality. ADRENAL GLANDS: No acute abnormality. KIDNEYS, URETERS AND BLADDER: No stones in the kidneys or ureters. No hydronephrosis. No perinephric or periureteral stranding. Urinary bladder is unremarkable. 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 The mid transverse colon is more gas distended today, but otherwise stable bowel loops, no evidence of mechanical obstruction. No free air, free fluid, or  mesenteric inflammation identified. PERITONEUM AND RETROPERITONEUM: No ascites. No free air. No free fluid. No mesenteric inflammation. VASCULATURE: Abdominal aortic atherosclerosis is moderate. Major arterial and portal venous structures in the abdomen and pelvis appear patent. Aorta is normal in caliber. LYMPH NODES: No lymphadenopathy. REPRODUCTIVE ORGANS: Streak artifact again limits visualization of the deep pelvis. 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 BONES AND SOFT TISSUES: Advanced  lower thoracic spine degeneration including vacuum disc chronic thoracic compression fractures including T9. Acute or subacute mild L1 compression fracture. L1 superior endplate compression fracture is new. Mild superior endplate deformity. L1 now measures 19.3 mm versus 22.8 mm normally. This is a 15% loss of vertebral body height. Subtle retropulsion of the posterior superior endplate on series 9 image 94. No significant spinal stenosis or complicating features. 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 No other acute osseous abnormality identified. IMPRESSION: 1. Acute mild L1 compression fracture with 15% loss of height. No significant retropulsion or other complicating features. 2. No other acute or inflammatory process identified in the abdomen or pelvis. 3. Stable roux-en-y type gastric bypass. Calcified aortic atherosclerosis. Electronically signed by: Helayne Hurst MD 05/24/2024 08:52 AM EST RP Workstation: [id number]   CT ABDOMEN PELVIS W CONTRAST Result Date: 05/21/2024 CLINICAL DATA:  Upper abdominal pain for 3 days with nausea and vomiting for 2 days. EXAM: CT ABDOMEN AND PELVIS WITH CONTRAST TECHNIQUE: Multidetector CT imaging of the abdomen and pelvis was performed using the standard protocol following bolus administration of intravenous contrast. RADIATION DOSE REDUCTION: This exam was performed according to the departmental dose-optimization program which includes  automated exposure control, adjustment of the mA and/or kV according to patient size and/or use of iterative reconstruction technique. CONTRAST:  75mL OMNIPAQUE  IOHEXOL  350 MG/ML SOLN COMPARISON:  03/29/2024 and older exams. FINDINGS: Lower chest: Clear lung bases. Hepatobiliary: No focal liver abnormality is seen. No gallstones, gallbladder wall thickening, or biliary dilatation. Pancreas: Unremarkable. No pancreatic ductal dilatation or surrounding inflammatory changes. Spleen: Normal in size without focal abnormality. Adrenals/Urinary Tract: Normal adrenal glands. Normal kidneys and ureters. Bladder decompressed. Stomach/Bowel: Previous gastric bypass surgery. No evidence of bowel obstruction. No stomach or bowel wall thickening or inflammation. Vascular/Lymphatic: Aortic atherosclerosis. No aneurysm. No enlarged lymph nodes. Reproductive: Status post hysterectomy. No adnexal masses. Other: No hernia.  No ascites. Musculoskeletal: Chronic compression fracture of T9. No acute fracture. No bone lesion. 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 IMPRESSION: 1. No acute findings. 2. Previous Roux-en-Y gastric bypass, without evidence of bowel obstruction or inflammation and unchanged from the prior CT. 3. Aortic atherosclerosis. Electronically Signed   By: Alm Parkins M.D.   On: 05/21/2024 11:06   DG Chest 1 View Result Date: 05/21/2024 CLINICAL DATA:  Cough.  Shortness of breath. EXAM: CHEST  1 VIEW COMPARISON:  02/03/2024 FINDINGS: The heart size and mediastinal contours are within normal limits. Both lungs are clear. The visualized skeletal structures are unremarkable. IMPRESSION: No active disease. Electronically Signed   By: Norleen DELENA Kil M.D.   On: 05/21/2024 09:42    Results are pending, will review when available.  Previous records (including but not limited to H&P, progress notes, nursing notes, TOC management) were reviewed in assessment of this  patient.  Labs: CBC: Recent Labs  Lab 05/24/24 0041 05/24/24 0758 05/25/24 1835 05/28/24 0446 05/29/24 1058 05/30/24 0811  WBC 9.9 10.0 7.2 6.9 4.6 6.0  NEUTROABS 8.3* 8.0* 5.1 4.2  --   --   HGB 9.9* 9.9* 9.1* 9.1* 8.5* 9.2*  HCT 32.5* 31.9* 30.0* 30.6* 28.9* 31.4*  MCV 71.9* 72.0* 73.3* 76.7* 78.5* 77.9*  PLT 216 226 204 141* 160 189   Basic Metabolic Panel: Recent Labs  Lab 05/24/24 0758 05/25/24 1835 05/28/24 0446 05/29/24 1058 05/30/24 0811  NA 134* 134* 135 134* 143  K 3.4* 3.4* 4.0 3.9 4.0  CL 94* 97*  98 96* 101  CO2 26 28 24 27  36*  GLUCOSE 158* 100* 162* 209* 102*  BUN <5* 5* <5* <5* <5*  CREATININE 0.53 0.65 0.55 0.61 0.57  CALCIUM  8.6* 8.3* 8.4* 7.8* 8.3*  MG  --   --   --   --  2.1   Liver Function Tests: Recent Labs  Lab 05/24/24 0041 05/24/24 0758 05/25/24 1835 05/28/24 0446 05/29/24 1058  AST 42* 60* 73* 25 23  ALT 29 27 36 23 18  ALKPHOS 86 112 60 56 53  BILITOT 2.1* 2.4* 0.9 0.5 0.8  PROT 7.2 7.4 6.2* 6.4* 5.9*  ALBUMIN  3.7 4.3 3.1* 3.1* 2.8*   CBG: No results for input(s): GLUCAP in the last 168 hours.  Scheduled Meds:  divalproex   250 mg Oral Daily   enoxaparin  (LOVENOX ) injection  40 mg Subcutaneous Q24H   folic acid   1 mg Oral Daily   guaiFENesin   600 mg Oral BID   levETIRAcetam   250 mg Oral BID   LORazepam   0-4 mg Intravenous Q12H   nicotine   21 mg Transdermal Daily   pantoprazole  (PROTONIX ) IV  40 mg Intravenous Q12H   polyethylene glycol  17 g Oral BID   sodium chloride  flush  3 mL Intravenous Q12H   sucralfate   1 g Oral TID WC & HS   thiamine   100 mg Oral Daily   Or   thiamine   100 mg Intravenous Daily   Continuous Infusions: PRN Meds:.acetaminophen  **OR** acetaminophen , albuterol , LORazepam  **OR** LORazepam , ondansetron  **OR** ondansetron  (ZOFRAN ) IV, oxyCODONE -acetaminophen   Family Communication: Attempted phone call  Disposition: Status is: Inpatient Remains inpatient appropriate because: Encephalopathy,  sbo     Time spent: 40 minutes  Length of inpatient stay: 1 days  Author: Carliss LELON Canales, DO 05/30/2024 11:57 AM  For on call review [email or web address].

## 2024-05-30 NOTE — Progress Notes (Signed)
 Patient ID: [name], female   DOB: 09/08/1965, 58 y.o.   MRN: [id number] Spoke with patient daughter [name]. She requested a call back from physician. Message sent to [name] Canales MD. Dr. Canales stated he tried to call twice and will reach out again tomorrow.  [name] JONETTA Collier, RN

## 2024-05-31 DIAGNOSIS — R4182 Altered mental status, unspecified: Secondary | ICD-10-CM

## 2024-05-31 LAB — CBC
HCT: 28.3 % — ABNORMAL LOW (ref 36.0–46.0)
Hemoglobin: 8.4 g/dL — ABNORMAL LOW (ref 12.0–15.0)
MCH: 23.1 pg — ABNORMAL LOW (ref 26.0–34.0)
MCHC: 29.7 g/dL — ABNORMAL LOW (ref 30.0–36.0)
MCV: 77.7 fL — ABNORMAL LOW (ref 80.0–100.0)
Platelets: 195 K/uL (ref 150–400)
RBC: 3.64 MIL/uL — ABNORMAL LOW (ref 3.87–5.11)
RDW: 22.1 % — ABNORMAL HIGH (ref 11.5–15.5)
WBC: 5.8 K/uL (ref 4.0–10.5)
nRBC: 0 % (ref 0.0–0.2)

## 2024-05-31 LAB — BASIC METABOLIC PANEL WITH GFR
Anion gap: 14 (ref 5–15)
BUN: <5 mg/dL — ABNORMAL LOW (ref 6–20)
CO2: 27 mmol/L (ref 22–32)
Calcium: 8.2 mg/dL — ABNORMAL LOW (ref 8.9–10.3)
Chloride: 100 mmol/L (ref 98–111)
Creatinine, Ser: 0.55 mg/dL (ref 0.44–1.00)
GFR, Estimated: >60 mL/min (ref >60)
Glucose, Bld: 105 mg/dL — ABNORMAL HIGH (ref 70–99)
Potassium: 4.2 mmol/L (ref 3.5–5.1)
Sodium: 141 mmol/L (ref 135–145)

## 2024-05-31 LAB — MAGNESIUM: Magnesium: 1.9 mg/dL (ref 1.7–2.4)

## 2024-05-31 MED ORDER — FERROUS SULFATE 325 (65 FE) MG PO TABS
325.0000 mg | ORAL_TABLET | Freq: Every day | ORAL | Status: DC
  Administered 2024-06-01: 325 mg via ORAL
  Filled 2024-05-31 (×2): qty 1

## 2024-05-31 MED ORDER — LABETALOL HCL 5 MG/ML IV SOLN
5.0000 mg | INTRAVENOUS | Status: DC | PRN
  Administered 2024-06-01 (×2): 5 mg via INTRAVENOUS
  Filled 2024-05-31 (×3): qty 4

## 2024-05-31 NOTE — Progress Notes (Signed)
 Progress Note   Patient: [name] FMW:[id number] DOB: [birth year] DOA: 05/28/2024  DOS: the patient was seen and examined on 05/31/2024   Brief hospital course:  58 y.o. female with medical history significant of paroxysmal atrial fibrillation, COPD, diabetes mellitus type 2, seizures, anxiety, depression, and polysubstance abuse presents with lower back pain radiating to the right leg and abdominal pain.    Assessment and Plan:   Concern for partial small bowel obstruction/severe constipation - Initially presenting with abdominal pain, nausea, vomiting.  CT scan showing diffuse stool burden.  GI/surgery consulted.  GI initiating bowel prep with clear liquids.  Nausea and vomiting resolved.  Abdomen soft, nontender.  Plan to EGD/colonoscopy although currently limited by patient's behaviors, encephalopathy.  Attempting to encourage bowel prep p.o.  Plan be would be to use NG tube however unclear if patient will tolerate.  Iron  deficiency anemia (POA) - Likely from chronic blood loss.  Iron  studies noting low iron , percent sat, with normal TIBC.  Reported severe allergy to IV iron .  Per family member, required rapid response.  Will supplement with p.o. iron  for now.   Chronic hypoxic respiratory failure/COPD - Baseline O2 3-4 L nasal cannula.  Intermittently compliant.  No active wheezing appreciated.  Appears to be at baseline.  Nebulizers as needed.   Acute metabolic encephalopathy/schizophrenia/bipolar substance abuse -Possibly exacerbated by alcohol  withdrawal.  Patient has multiple episodes of leaving AMA.  Known schizoaffective disorder, bipolar, schizophrenia, substance-induced mood disorder/psychosis, alcohol  use disorder severe, polysubstance abuse (I.e., though largely crack cocaine).  Has been IVC in the past due to lack of decision making capacity.  Daughter currently in process of obtaining guardianship.  Depakote  on board.  Ativan  as needed.   Alcohol  abuse - Reports drinking  a 40 daily.  May be contributing to agitation and confusion along with underlying psych disorders.  CIWA on board, vitamin supplementation.   Cocaine abuse - Counseled on cessation.   Seizure disorder - Keppra  on board.   GERD with esophagitis/hiatal hernia - PPI, Carafate  on board.   Thoracic compression fracture (POA) - CT revealed unchanged compression fracture at T9.  Oxy on board.   Goals of care - Pending EGD.  Anticipate DC 1-2 days.   Subjective: Patient sitting up at bedside, very pleasant this morning.  Denies any fever, chills, chest pain, nausea, vomiting, abdominal pain.  Reportedly agitated requiring Haldol  overnight.  Able to speak with patient's daughter to update on plan, addressed any questions and concerns.  Physical Exam:  Vitals:   05/31/24 0043 05/31/24 0520 05/31/24 0620 05/31/24 0810  BP: 121/67 (!) 142/79 (!) 150/93 (!) 154/96  Pulse: 88 93 (!) 108 (!) 105  Resp:  19  18  Temp:  (!) 97.5 F (36.4 C)    TempSrc:  Oral    SpO2:  100%  95%  Height:        GENERAL:  Alert, pleasant, no acute distress  HEENT:  EOMI CARDIOVASCULAR:  RRR, no murmurs appreciated RESPIRATORY:  Clear to auscultation, no wheezing, rales, or rhonchi GASTROINTESTINAL:  Soft, nontender, nondistended EXTREMITIES:  No LE edema bilaterally NEURO:  No new focal deficits appreciated SKIN:  No rashes noted PSYCH: Impulsive   Data Reviewed:  Imaging Studies: CT ABDOMEN PELVIS W CONTRAST Result Date: 05/28/2024 EXAM: CT ABDOMEN AND PELVIS WITH CONTRAST 05/28/2024 08:48:03 AM TECHNIQUE: CT of the abdomen and pelvis was performed with the administration of 75 mL of iohexol  (OMNIPAQUE ) 350 MG/ML injection. Multiplanar reformatted images are provided for review. Automated  exposure control, iterative reconstruction, and/or weight-based adjustment of the mA/kV was utilized to reduce the radiation dose to as low as reasonably achievable. COMPARISON: 05/24/24 CLINICAL HISTORY: Abdominal  pain, acute, nonlocalized; worsening xray. FINDINGS: LOWER CHEST: Areas of subsegmental atelectasis are present within the lingula and in both lower lobes. A patchy area of ground-glass attenuation is noted within the medial left upper lobe. Coronary artery calcifications are present. LIVER: An unchanged, too small to characterize 4 mm hypodensity is noted in the right lobe of the liver. GALLBLADDER AND BILE DUCTS: Gallbladder is unremarkable. Fusiform dilatation of the common bile duct measures up to 8 mm, previously 6 mm. No calcified stone is identified within the common bile duct. SPLEEN: The spleen is within normal limits in size and appearance. PANCREAS: The pancreas is normal in size and contour, without focal lesion or ductal dilatation. ADRENAL GLANDS: No periadrenal stranding is present. No nodule, thickening, or hemorrhage is seen. KIDNEYS, URETERS AND BLADDER: The kidneys are normal in size and morphology bilaterally. No stones in the kidneys or ureters. No hydronephrosis. No perinephric or periureteral stranding. The urinary bladder is obscured by streak artifact from the patient's right hip arthroplasty device. GI AND BOWEL: Circumferential wall thickening of the distal esophagus with a small to moderate hiatal hernia is noted. Postoperative changes from previous gastric bypass surgery are present. No pathologic dilatation of the small bowel loops or signs of bowel inflammation are seen. Moderate colonic stool burden is noted involving the ascending colon. There is progressive gaseous distention of the redundant colon to the level of the descending colon with additional gaseous distention of the sigmoid colon. The distal sigmoid colon and rectum are decreased in caliber. A focal short segment of luminal narrowing at the junction between the descending colon and sigmoid colon is noted in the left hemiabdomen, on coronal image 65/6. There are nondilated loops of small bowel noted lateral to the dilated  descending colon, which may reflect an underlying internal hernia. PERITONEUM AND RETROPERITONEUM: No free fluid or fluid collections are identified. No free air. VASCULATURE: Aorta is normal in caliber. Aortic atherosclerosis and aortic atherosclerotic calcifications are present. The mesenteric vasculature is patent. LYMPH NODES: No signs of abdominopelvic adenopathy are seen. REPRODUCTIVE ORGANS: The uterus is not visualized and may be surgically absent. No signs of adnexal mass are seen. BONES AND SOFT TISSUES: Previous right hip arthroplasty is noted. An unchanged compression fracture involves the T9 vertebra. A subacute fracture involving the superior endplate of T9 is again seen with mild loss of the superior endplate height. Approximately 4 mm retropulsion of the superior posterior endplate is noted at this level. No new compression fractures are identified. No focal soft tissue abnormality. IMPRESSION: 1. Focal short segment luminal narrowing at the junction between the descending colon and sigmoid colon with associated gaseous distention of the redundant colon and sigmoid colon, and decreased caliber of the distal sigmoid colon and rectum. Nondilated small bowel loops lateral to the dilated descending colon may reflect an underlying internal hernia possibly resulting in partial obstruction of the left colon at the junction between the descending colon and sigmoid colon. 2. Fusiform dilatation of the common bile duct now measuring up to 8 mm, previously 6 mm. No calcified stone is identified. 3. Circumferential wall thickening of the distal esophagus with a small to moderate hiatal hernia. Electronically signed by: Waddell Calk MD 05/28/2024 09:27 AM EST RP Workstation: [id number]   DG Lumbar Spine Complete Result Date: 05/28/2024 EXAM: 4 VIEW(S) XRAY OF  THE LUMBAR SPINE 05/28/2024 06:07:44 AM COMPARISON: Lumbar spine MRI 05/25/2024. CLINICAL HISTORY: fall? FINDINGS: LUMBAR SPINE: BONES: Stable  appearance of the subacute fracture deformity involving the superior endplate of the L1 vertebra with less than 25 percent vertebral body height loss. No new fractures identified. The remaining lumbar vertebral body heights are well preserved. Alignment is normal. DISCS AND DEGENERATIVE CHANGES: The lumbar disc spaces are well preserved. No severe degenerative changes. SOFT TISSUES: Diffuse gaseous distention of the colon with moderate right-sided colonic stool burden. Signs of previous right total hip arthroplasty. No acute abnormality. IMPRESSION: 1. Stable subacute fracture deformity involving the superior endplate of the L1 vertebra with less than 25 percent vertebral body height loss. No new fractures identified. Electronically signed by: Waddell Calk MD 05/28/2024 06:18 AM EST RP Workstation: [id number]   DG Abdomen Acute W/Chest Result Date: 05/28/2024 EXAM: UPRIGHT AND SUPINE XRAY VIEWS OF THE ABDOMEN AND 4 VIEW(S) OF THE CHEST 05/28/2024 06:07:44 AM COMPARISON: 05/25/2024 CLINICAL HISTORY: sob, constipation FINDINGS: LUNGS AND PLEURA: Mild elevation of the left hemidiaphragm with mild bibasilar atelectasis. No pleural effusion or pneumothorax. HEART AND MEDIASTINUM: Overlying cardiac leads. No acute abnormality of the cardiac and mediastinal silhouettes. BOWEL: Diffuse gaseous distention of the colon with moderate right-sided colonic stool burden. PERITONEUM AND SOFT TISSUES: No abnormal calcifications. No free air. BONES: Right total hip arthroplasty. No acute fracture. IMPRESSION: 1. Diffuse gaseous distention of the colon with moderate right-sided colonic stool burden. 2. Mild bibasilar atelectasis with mild elevation of the left hemidiaphragm. Electronically signed by: Waddell Calk MD 05/28/2024 06:16 AM EST RP Workstation: [id number]   DG Knee Complete 4 Views Right Result Date: 05/25/2024 CLINICAL DATA:  Fall. EXAM: RIGHT KNEE - COMPLETE 4+ VIEW COMPARISON:  None Available. FINDINGS: No  evidence of fracture, dislocation, or joint effusion. There is moderate tricompartmental osteoarthrosis with joint space narrowing and osteophyte formation. Soft tissues are unremarkable. IMPRESSION: 1. No acute fracture or dislocation. 2. Moderate tricompartmental osteoarthrosis. Electronically Signed   By: Greig Pique M.D.   On: 05/25/2024 22:36   DG Chest 2 View Result Date: 05/25/2024 CLINICAL DATA:  Chest pain after fall EXAM: DG CHEST 2V COMPARISON:  Chest x-ray 05/21/2024 FINDINGS: The heart size and mediastinal contours are within normal limits. Both lungs are clear. The visualized skeletal structures are unremarkable. IMPRESSION: No active cardiopulmonary disease. Electronically Signed   By: Greig Pique M.D.   On: 05/25/2024 22:35   CT Cervical Spine Wo Contrast Result Date: 05/25/2024 CLINICAL DATA:  Status post trauma. EXAM: CT CERVICAL SPINE WITHOUT CONTRAST TECHNIQUE: Multidetector CT imaging of the cervical spine was performed without intravenous contrast. Multiplanar CT image reconstructions were also generated. RADIATION DOSE REDUCTION: This exam was performed according to the departmental dose-optimization program which includes automated exposure control, adjustment of the mA and/or kV according to patient size and/or use of iterative reconstruction technique. COMPARISON:  Oct 29, 2023 FINDINGS: Alignment: There is straightening of the normal cervical spine lordosis. Skull base and vertebrae: No acute fracture. No primary bone lesion or focal pathologic process. Soft tissues and spinal canal: No prevertebral fluid or swelling. No visible canal hematoma. Disc levels: Mild endplate sclerosis, anterior osteophyte formation and posterior bony spurring are seen at the levels of C3-C4, C4-C5 and C5-C6. Mild multilevel intervertebral disc space narrowing is seen throughout all levels of the cervical spine, slightly more prominent at the level of C2-C3. Mild, bilateral multilevel facet joint  hypertrophy is noted. Upper chest: Negative. Other: None. IMPRESSION: 1. No acute  fracture or subluxation in the cervical spine. 2. Mild multilevel degenerative changes. Electronically Signed   By: Suzen Dials M.D.   On: 05/25/2024 22:04   CT Head Wo Contrast Result Date: 05/25/2024 CLINICAL DATA:  Status post fall EXAM: CT HEAD WITHOUT CONTRAST TECHNIQUE: Contiguous axial images were obtained from the base of the skull through the vertex without intravenous contrast. RADIATION DOSE REDUCTION: This exam was performed according to the departmental dose-optimization program which includes automated exposure control, adjustment of the mA and/or kV according to patient size and/or use of iterative reconstruction technique. COMPARISON:  None Available. FINDINGS: Brain: No evidence of acute infarction, hemorrhage, hydrocephalus, extra-axial collection or mass lesion/mass effect. Vascular: Mild bilateral cavernous carotid artery calcification is noted. Skull: Normal. Negative for fracture or focal lesion. Sinuses/Orbits: No acute finding. Other: None. IMPRESSION: No acute intracranial abnormality. Electronically Signed   By: Suzen Dials M.D.   On: 05/25/2024 22:01   MR LUMBAR SPINE WO CONTRAST Result Date: 05/25/2024 EXAM: MRI LUMBAR SPINE 05/25/2024 07:55:00 PM TECHNIQUE: Multiplanar multisequence MRI of the lumbar spine was performed without the administration of intravenous contrast. COMPARISON: CT abdomen and pelvis from 06/13/2024, lumbar spine radiograph 01/2024. CLINICAL HISTORY: Recent lumbar fracture with new numbness to right leg and falls. FINDINGS: BONES AND ALIGNMENT: Normal alignment except for findings at L1. There is a recent likely subacute wedge compression fracture of L1 with less than 25% height loss and approximately 2 mm of retropulsion of the posterior superior corner. Mild bone marrow edema is present at L1. Normal vertebral body heights otherwise. Bone marrow signal is  unremarkable otherwise. SPINAL CORD: The conus terminates normally. SOFT TISSUES: No paraspinal mass. L1-L2: No significant disc herniation. No spinal canal stenosis or neural foraminal narrowing. L2-L3: No significant disc herniation. No spinal canal stenosis or neural foraminal narrowing. L3-L4: No significant disc herniation. No spinal canal stenosis or neural foraminal narrowing. L4-L5: Small disc bulge and moderate facet hypertrophy. No central spinal canal or neural foraminal stenosis. L5-S1: No significant disc herniation. Mild facet hypertrophy. No central spinal canal or neural foraminal stenosis. IMPRESSION: 1. Recent, likely subacute wedge compression fracture of L1 with less than 25% height loss, approximately 2 mm of retropulsion of the posterior superior corner, and mild bone marrow edema. Electronically signed by: Franky Stanford MD 05/25/2024 08:56 PM EST RP Workstation: [id number]   CT ABDOMEN PELVIS W CONTRAST Result Date: 05/24/2024 EXAM: CT ABDOMEN AND PELVIS WITH CONTRAST 05/24/2024 08:42:08 AM TECHNIQUE: CT of the abdomen and pelvis was performed with the administration of 80 mL of iohexol  (OMNIPAQUE ) 300 MG/ML solution. Multiplanar reformatted images are provided for review. Automated exposure control, iterative reconstruction, and/or weight-based adjustment of the mA/kV was utilized to reduce the radiation dose to as low as reasonably achievable. COMPARISON: Recent CT abdomen and pelvis 05/21/2024, and earlier. CLINICAL HISTORY: 58 year old female. Abdominal pain, acute, nonlocalized; Abdominal/flank pain, stone suspected. FINDINGS: LOWER CHEST: Mild lung base atelectasis, overall lung base ventilation has not significantly changed. LIVER: The liver is unremarkable. GALLBLADDER AND BILE DUCTS: Gallbladder is unremarkable. No biliary ductal dilatation. SPLEEN: No acute abnormality. PANCREAS: No acute abnormality. ADRENAL GLANDS: No acute abnormality. KIDNEYS, URETERS AND BLADDER: No stones in  the kidneys or ureters. No hydronephrosis. No perinephric or periureteral stranding. Urinary bladder is unremarkable. 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 The mid transverse colon is more gas distended today, but otherwise stable bowel loops, no evidence of mechanical obstruction. No free  air, free fluid, or mesenteric inflammation identified. PERITONEUM AND RETROPERITONEUM: No ascites. No free air. No free fluid. No mesenteric inflammation. VASCULATURE: Abdominal aortic atherosclerosis is moderate. Major arterial and portal venous structures in the abdomen and pelvis appear patent. Aorta is normal in caliber. LYMPH NODES: No lymphadenopathy. REPRODUCTIVE ORGANS: Streak artifact again limits visualization of the deep pelvis. 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 Acute or subacute mild L1 compression fracture. L1 superior endplate compression fracture is new. Mild superior endplate deformity. L1 now measures 19.3 mm versus 22.8 mm normally. This is a 15% loss of vertebral body height. Subtle retropulsion of the posterior superior endplate on series 9 image 94. No significant spinal stenosis or complicating features. 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 No other acute osseous abnormality identified. IMPRESSION: 1. Acute mild L1 compression fracture with 15% loss of height. No significant retropulsion or other complicating features. 2. No other acute or inflammatory process identified in the abdomen or pelvis. 3. Stable roux-en-y type gastric bypass. Calcified aortic atherosclerosis. Electronically signed by: Helayne Hurst MD 05/24/2024 08:52 AM EST RP Workstation: [id number]   CT ABDOMEN PELVIS W CONTRAST Result Date: 05/21/2024 CLINICAL DATA:  Upper  abdominal pain for 3 days with nausea and vomiting for 2 days. EXAM: CT ABDOMEN AND PELVIS WITH CONTRAST TECHNIQUE: Multidetector CT imaging of the abdomen and pelvis was performed using the standard protocol following bolus administration of intravenous contrast. RADIATION DOSE REDUCTION: This exam was performed according to the departmental dose-optimization program which includes automated exposure control, adjustment of the mA and/or kV according to patient size and/or use of iterative reconstruction technique. CONTRAST:  75mL OMNIPAQUE  IOHEXOL  350 MG/ML SOLN COMPARISON:  03/29/2024 and older exams. FINDINGS: Lower chest: Clear lung bases. Hepatobiliary: No focal liver abnormality is seen. No gallstones, gallbladder wall thickening, or biliary dilatation. Pancreas: Unremarkable. No pancreatic ductal dilatation or surrounding inflammatory changes. Spleen: Normal in size without focal abnormality. Adrenals/Urinary Tract: Normal adrenal glands. Normal kidneys and ureters. Bladder decompressed. Stomach/Bowel: Previous gastric bypass surgery. No evidence of bowel obstruction. No stomach or bowel wall thickening or inflammation. Vascular/Lymphatic: Aortic atherosclerosis. No aneurysm. No enlarged lymph nodes. Reproductive: Status post hysterectomy. No adnexal masses. Other: No hernia.  No ascites. Musculoskeletal: Chronic compression fracture of T9. No acute fracture. No bone lesion. 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 IMPRESSION: 1. No acute findings. 2. Previous Roux-en-Y gastric bypass, without evidence of bowel obstruction or inflammation and unchanged from the prior CT. 3. Aortic atherosclerosis. Electronically Signed   By: Alm Parkins M.D.   On: 05/21/2024 11:06   DG Chest 1 View Result Date: 05/21/2024 CLINICAL DATA:  Cough.  Shortness of breath. EXAM: CHEST  1 VIEW COMPARISON:  02/03/2024 FINDINGS: The heart size and mediastinal contours are within normal limits. Both  lungs are clear. The visualized skeletal structures are unremarkable. IMPRESSION: No active disease. Electronically Signed   By: Norleen DELENA Kil M.D.   On: 05/21/2024 09:42    There are no new results to review at this time.  Previous records (including but not limited to H&P, progress notes, nursing notes, TOC management) were reviewed in assessment of this patient.  Labs: CBC: Recent Labs  Lab 05/25/24 1835 05/28/24 0446 05/29/24 1058 05/30/24 0811 05/31/24 0439  WBC 7.2 6.9 4.6 6.0 5.8  NEUTROABS 5.1 4.2  --   --   --  HGB 9.1* 9.1* 8.5* 9.2* 8.4*  HCT 30.0* 30.6* 28.9* 31.4* 28.3*  MCV 73.3* 76.7* 78.5* 77.9* 77.7*  PLT 204 141* 160 189 195   Basic Metabolic Panel: Recent Labs  Lab 05/25/24 1835 05/28/24 0446 05/29/24 1058 05/30/24 0811 05/31/24 0439  NA 134* 135 134* 143 141  K 3.4* 4.0 3.9 4.0 4.2  CL 97* 98 96* 101 100  CO2 28 24 27  36* 27  GLUCOSE 100* 837* 209* 102* 105*  BUN 5* <5* <5* <5* <5*  CREATININE 0.65 0.55 0.61 0.57 0.55  CALCIUM  8.3* 8.4* 7.8* 8.3* 8.2*  MG  --   --   --  2.1 1.9   Liver Function Tests: Recent Labs  Lab 05/25/24 1835 05/28/24 0446 05/29/24 1058  AST 73* 25 23  ALT 36 23 18  ALKPHOS 60 56 53  BILITOT 0.9 0.5 0.8  PROT 6.2* 6.4* 5.9*  ALBUMIN  3.1* 3.1* 2.8*   CBG: No results for input(s): GLUCAP in the last 168 hours.  Scheduled Meds:  divalproex   250 mg Oral Daily   enoxaparin  (LOVENOX ) injection  40 mg Subcutaneous Q24H   [START ON 06/01/2024] ferrous sulfate   325 mg Oral Q breakfast   folic acid   1 mg Oral Daily   guaiFENesin   600 mg Oral BID   levETIRAcetam   250 mg Oral BID   LORazepam   0-4 mg Intravenous Q12H   nicotine   21 mg Transdermal Daily   pantoprazole  (PROTONIX ) IV  40 mg Intravenous Q12H   polyethylene glycol  17 g Oral BID   sodium chloride  flush  3 mL Intravenous Q12H   sucralfate   1 g Oral TID WC & HS   thiamine   100 mg Oral Daily   Or   thiamine   100 mg Intravenous Daily   Continuous  Infusions: PRN Meds:.acetaminophen  **OR** acetaminophen , albuterol , ondansetron  **OR** ondansetron  (ZOFRAN ) IV, oxyCODONE -acetaminophen   Family Communication: Daughter   Disposition: Status is: Inpatient Remains inpatient appropriate because: Encephalopathy, colonoscopy     Time spent: 35 minutes  Length of inpatient stay: 2 days  Author: Carliss LELON Canales, DO 05/31/2024 10:52 AM  For on call review [email or web address].

## 2024-05-31 NOTE — Progress Notes (Signed)
      Progress Note   Subjective  Reportedly moving bowels okay yesterday, taking Miralax . Tolerating full liquids. No vomiting. She remains confused at times, agitated, given haldol  overnight. Nursing states daughter does not think patient will comply to do EGD and colonoscopy. I have tried calling daughter / POA this AM, no answer, I left a message   Objective   Vital signs in last 24 hours: Temp:  [97.5 F (36.4 C)-97.7 F (36.5 C)] 97.5 F (36.4 C) (12/09 0520) Pulse Rate:  [82-109] 108 (12/09 0620) Resp:  [18-19] 19 (12/09 0520) BP: (121-155)/(62-97) 150/93 (12/09 0620) SpO2:  [96 %-100 %] 100 % (12/09 0520) Last BM Date : 05/30/24 General:    AA female in NAD Neurologic:  Alert and answering some questions appropriately but falls asleep, difficult time keeping attention and responding   Intake/Output from previous day: 12/08 0701 - 12/09 0700 In: 594 [P.O.:594] Out: -  Intake/Output this shift: No intake/output data recorded.  Lab Results: Recent Labs    05/29/24 1058 05/30/24 0811 05/31/24 0439  WBC 4.6 6.0 5.8  HGB 8.5* 9.2* 8.4*  HCT 28.9* 31.4* 28.3*  PLT 160 189 195   BMET Recent Labs    05/29/24 1058 05/30/24 0811 05/31/24 0439  NA 134* 143 141  K 3.9 4.0 4.2  CL 96* 101 100  CO2 27 36* 27  GLUCOSE 209* 102* 105*  BUN <5* <5* <5*  CREATININE 0.61 0.57 0.55  CALCIUM  7.8* 8.3* 8.2*   LFT Recent Labs    05/29/24 1058  PROT 5.9*  ALBUMIN  2.8*  AST 23  ALT 18  ALKPHOS 53  BILITOT 0.8   PT/INR No results for input(s): LABPROT, INR in the last 72 hours.  Studies/Results: No results found.     Assessment / Plan:    58 y/o female here with the following:  Abnormal CT scan - ? partial colonic obstruction on CT scan, esophageal thickening Iron  deficiency anemia Polysubstance abuse (EtOH, cocaine) Altered mental status / delerious History of gastric bypass with small bowel resection  Labs c/w iron  deficiency and she has had  this in the past. EGD and colonoscopy recommended for this issue and in light of her CT scan findings. On miralax  she is moving her bowels and appears decompressed, tolerating liquids. Issue has been her mental status, has required haldol  overnight for agitation. I do not think she is ready yet to do a bowel preparation, nor does she understand the situation and why this is recommended. I have tried calling POA / daughter to see how realistic she thinks the patient would be to do a bowel prep and how aggressive they want us  to be with her care. No answer. Will try again later and speak with primary team  Continue bowel regimen for now, recommend IV iron  while inpatient.  Marcey Naval, MD [facility]

## 2024-05-31 NOTE — Plan of Care (Signed)

## 2024-05-31 NOTE — Progress Notes (Signed)
 Transition of Care [facility]) - Inpatient Brief Assessment   Patient Details  Name: [name] MRN: [id number] Date of Birth: 1966/04/24  [facility] CM/SW Contact:    [name] JONELLE Joe, RN Phone Number: 05/31/2024, 2:29 PM   Clinical Narrative: Patient admitted to the hospital from home with gastrointestinal obstruction.  Patient will likely return home when medically stable.  Patient is assisted with care in the community by her family.  [name], [name] is in the process of obtaining guardianship of the patient.  MD has provided daily updates with the patient's daughter, [name], who is a IP Care management MSW at [employer] and navigates that patient's care decisions.   Transition of Care Asessment: Insurance and Status: (P) Insurance coverage has been reviewed Patient has primary care physician: (P) Yes Home environment has been reviewed: (P) from home Prior level of function:: (P) family assistance Prior/Current Home Services: (P) No current home services Social Drivers of Health Review: (P) SDOH reviewed no interventions necessary Readmission risk has been reviewed: (P) Yes Transition of care needs: (P) no transition of care needs at this time

## 2024-06-01 DIAGNOSIS — D500 Iron deficiency anemia secondary to blood loss (chronic): Secondary | ICD-10-CM

## 2024-06-01 DIAGNOSIS — R109 Unspecified abdominal pain: Secondary | ICD-10-CM | POA: Diagnosis not present

## 2024-06-01 DIAGNOSIS — K56699 Other intestinal obstruction unspecified as to partial versus complete obstruction: Secondary | ICD-10-CM

## 2024-06-01 LAB — BASIC METABOLIC PANEL WITH GFR
Anion gap: 11 (ref 5–15)
BUN: <5 mg/dL — ABNORMAL LOW (ref 6–20)
CO2: 27 mmol/L (ref 22–32)
Calcium: 8.9 mg/dL (ref 8.9–10.3)
Chloride: 100 mmol/L (ref 98–111)
Creatinine, Ser: 0.9 mg/dL (ref 0.44–1.00)
GFR, Estimated: >60 mL/min (ref >60)
Glucose, Bld: 96 mg/dL (ref 70–99)
Potassium: 4.4 mmol/L (ref 3.5–5.1)
Sodium: 138 mmol/L (ref 135–145)

## 2024-06-01 LAB — CBC
HCT: 33.2 % — ABNORMAL LOW (ref 36.0–46.0)
Hemoglobin: 9.7 g/dL — ABNORMAL LOW (ref 12.0–15.0)
MCH: 22.5 pg — ABNORMAL LOW (ref 26.0–34.0)
MCHC: 29.2 g/dL — ABNORMAL LOW (ref 30.0–36.0)
MCV: 77 fL — ABNORMAL LOW (ref 80.0–100.0)
Platelets: 218 K/uL (ref 150–400)
RBC: 4.31 MIL/uL (ref 3.87–5.11)
RDW: 22.8 % — ABNORMAL HIGH (ref 11.5–15.5)
WBC: 5.9 K/uL (ref 4.0–10.5)
nRBC: 0 % (ref 0.0–0.2)

## 2024-06-01 MED ORDER — SIMETHICONE 80 MG PO CHEW
240.0000 mg | CHEWABLE_TABLET | Freq: Once | ORAL | Status: AC
  Administered 2024-06-01: 240 mg via ORAL
  Filled 2024-06-01: qty 3

## 2024-06-01 MED ORDER — NA SULFATE-K SULFATE-MG SULF 17.5-3.13-1.6 GM/177ML PO SOLN
0.5000 | Freq: Once | ORAL | Status: AC
  Administered 2024-06-01: 177 mL via ORAL

## 2024-06-01 MED ORDER — POLYETHYLENE GLYCOL 3350 17 GM/SCOOP PO POWD
119.0000 g | Freq: Once | ORAL | Status: AC
  Administered 2024-06-01: 119 g via ORAL
  Filled 2024-06-01: qty 119

## 2024-06-01 MED ORDER — SODIUM CHLORIDE 0.9 % IV SOLN: INTRAVENOUS | Status: AC

## 2024-06-01 MED ORDER — NICOTINE POLACRILEX 2 MG MT GUM
2.0000 mg | CHEWING_GUM | OROMUCOSAL | Status: DC | PRN
  Administered 2024-06-01 (×2): 2 mg via ORAL
  Filled 2024-06-01 (×3): qty 1

## 2024-06-01 MED ORDER — NA SULFATE-K SULFATE-MG SULF 17.5-3.13-1.6 GM/177ML PO SOLN
0.5000 | Freq: Once | ORAL | Status: AC
  Administered 2024-06-01: 177 mL via ORAL
  Filled 2024-06-01: qty 1

## 2024-06-01 MED ORDER — LIDOCAINE 5 % EX PTCH
1.0000 | MEDICATED_PATCH | Freq: Every day | CUTANEOUS | Status: DC | PRN
  Administered 2024-06-01 – 2024-06-02 (×2): 1 via TRANSDERMAL
  Filled 2024-06-01 (×2): qty 1

## 2024-06-01 MED ORDER — TRAMADOL HCL 50 MG PO TABS
50.0000 mg | ORAL_TABLET | Freq: Two times a day (BID) | ORAL | Status: DC | PRN
  Administered 2024-06-01 – 2024-06-03 (×3): 50 mg via ORAL
  Filled 2024-06-01 (×3): qty 1

## 2024-06-01 NOTE — H&P (View-Only) (Signed)
 Daily Progress Note  DOA: 05/28/2024 Hospital Day: 5  Cc: iron  deficiency anemia, abnormal and colon normal CT scan.   ASSESSMENT    58 yo female with abnormal CT scan - ?  possible partial colonic obstruction, esophageal wall thickening, also iron  deficiency anemia  Surgery evaluated, doubts colonic obstruction and has signed off. TODAY: Hemoglobin stable at 9.7.  We have been holding off on doing EGD and colonoscopy until mental status improved.  Patient is alert and oriented today, sitting up on the side of the bed.    Iron  deficiency anemia ( chronic) Hgb is stable at 9.7.  She has not required blood transfusion   Altered mental status Polysubstance abuse (Etoh, tobacco, cocaine) TODAY: Mental status improved. 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   DM2   Paroxysmal atrial fibrillation   History of seizures  Principal Problem:   Partial bowel obstruction (HCC) Active Problems:   COPD with acute exacerbation (HCC)   Abdominal pain   Schizoaffective disorder (HCC)   Seizure disorder (HCC)   Constipation   Iron  deficiency anemia   Tobacco abuse   Cocaine abuse (HCC)   Low back pain   Alcohol  abuse   Chronic respiratory failure with hypoxia (HCC)   Thoracic compression fracture (HCC)   GERD with esophagitis   Hiatal hernia   Thrombocytopenia   Colon stricture (HCC)   Abnormal CT scan, gastrointestinal tract   H/O gastric bypass   Altered mental status    PLAN   --Patient feels up to proceeding with EGD / colonoscopy tomorrow.  We spoke with patient's daughter yesterday and she would like for us  to proceed with EGD and colonoscopy as well. .  --EGD and colonoscopy tomorrow.  The risks and benefits of EGD with possible biopsies and colonoscopy with possible biopsies and removal of polyps were discussed with the patient  and her daughter and they agree to proceed. Will  ask that daughter sign consent form.  proceed. -- Concerned she may be difficult to prep given questionable colonic narrowing/obstruction.  Given this we will hold constipating medications including oral iron  and Carafate .  --Will give her half of a MiraLAX  prep today then full bowel prep this evening -- Will stop her Lovenox  after tonight's dose   Subjective   No physical complaints other than lingering back discomfort   Objective    Recent Labs    05/30/24 0811 05/31/24 0439 06/01/24 0438  WBC 6.0 5.8 5.9  HGB 9.2* 8.4* 9.7*  HCT 31.4* 28.3* 33.2*  MCV 77.9* 77.7* 77.0*  PLT 189 195 218   Recent Labs    05/30/24 0811  FERRITIN 23  TIBC 428  IRONPCTSAT 5*   Recent Labs    05/30/24 0811 05/31/24 0439 06/01/24 0438  NA 143 141 138  K 4.0 4.2 4.4  CL 101 100 100  CO2 36* 27 27  GLUCOSE 102* 105* 96  BUN <5* <5* <5*  CREATININE 0.57 0.55 0.90  CALCIUM  8.3* 8.2* 8.9   Recent Labs    05/29/24 1058  PROT 5.9*  ALBUMIN  2.8*  AST 23  ALT 18  ALKPHOS 53  BILITOT 0.8     Imaging:  CT ABDOMEN PELVIS W CONTRAST EXAM: CT ABDOMEN AND PELVIS WITH CONTRAST 05/28/2024 08:48:03 AM  TECHNIQUE: CT of the abdomen and pelvis was performed with the administration of 75 mL of iohexol  (OMNIPAQUE ) 350 MG/ML  injection. Multiplanar reformatted images are provided for review. Automated exposure control, iterative reconstruction, and/or weight-based adjustment of the mA/kV was utilized to reduce the radiation dose to as low as reasonably achievable.  COMPARISON: 05/24/24  CLINICAL HISTORY: Abdominal pain, acute, nonlocalized; worsening xray.  FINDINGS:  LOWER CHEST: Areas of subsegmental atelectasis are present within the lingula and in both lower lobes. A patchy area of ground-glass attenuation is noted within the medial left upper lobe. Coronary artery calcifications are present.  LIVER: An unchanged, too small to characterize 4 mm hypodensity is noted in the  right lobe of the liver.  GALLBLADDER AND BILE DUCTS: Gallbladder is unremarkable. Fusiform dilatation of the common bile duct measures up to 8 mm, previously 6 mm. No calcified stone is identified within the common bile duct.  SPLEEN: The spleen is within normal limits in size and appearance.  PANCREAS: The pancreas is normal in size and contour, without focal lesion or ductal dilatation.  ADRENAL GLANDS: No periadrenal stranding is present. No nodule, thickening, or hemorrhage is seen.  KIDNEYS, URETERS AND BLADDER: The kidneys are normal in size and morphology bilaterally. No stones in the kidneys or ureters. No hydronephrosis. No perinephric or periureteral stranding. The urinary bladder is obscured by streak artifact from the patient's right hip arthroplasty device.  GI AND BOWEL: Circumferential wall thickening of the distal esophagus with a small to moderate hiatal hernia is noted.  Postoperative changes from previous gastric bypass surgery are present. No pathologic dilatation of the small bowel loops or signs of bowel inflammation are seen.  Moderate colonic stool burden is noted involving the ascending colon. There is progressive gaseous distention of the redundant colon to the level of the descending colon with additional gaseous distention of the sigmoid colon. The distal sigmoid colon and rectum are decreased in caliber. A focal short segment of luminal narrowing at the junction between the descending colon and sigmoid colon is noted in the left hemiabdomen, on coronal image 65/6. There are nondilated loops of small bowel noted lateral to the dilated descending colon, which may reflect an underlying internal hernia.  PERITONEUM AND RETROPERITONEUM: No free fluid or fluid collections are identified. No free air.  VASCULATURE: Aorta is normal in caliber. Aortic atherosclerosis and aortic atherosclerotic calcifications are present. The mesenteric vasculature  is patent.  LYMPH NODES: No signs of abdominopelvic adenopathy are seen.  REPRODUCTIVE ORGANS: The uterus is not visualized and may be surgically absent. No signs of adnexal mass are seen.  BONES AND SOFT TISSUES: Previous right hip arthroplasty is noted. An unchanged compression fracture involves the T9 vertebra. A subacute fracture involving the superior endplate of T9 is again seen with mild loss of the superior endplate height. Approximately 4 mm retropulsion of the superior posterior endplate is noted at this level. No new compression fractures are identified. No focal soft tissue abnormality.  IMPRESSION: 1. Focal short segment luminal narrowing at the junction between the descending colon and sigmoid colon with associated gaseous distention of the redundant colon and sigmoid colon, and decreased caliber of the distal sigmoid colon and rectum. Nondilated small bowel loops lateral to the dilated descending colon may reflect an underlying internal hernia possibly resulting in partial obstruction of the left colon at the junction between the descending colon and sigmoid colon. 2. Fusiform dilatation of the common bile duct now measuring up to 8 mm, previously 6 mm. No calcified stone is identified. 3. Circumferential wall thickening of the distal esophagus with a small to moderate hiatal  hernia.  Electronically signed by: Waddell Calk MD 05/28/2024 09:27 AM EST RP Workstation: [id number] DG Lumbar Spine Complete EXAM: 4 VIEW(S) XRAY OF THE LUMBAR SPINE 05/28/2024 06:07:44 AM  COMPARISON: Lumbar spine MRI 05/25/2024.  CLINICAL HISTORY: fall?  FINDINGS:  LUMBAR SPINE: BONES: Stable appearance of the subacute fracture deformity involving the superior endplate of the L1 vertebra with less than 25 percent vertebral body height loss. No new fractures identified. The remaining lumbar vertebral body heights are well preserved. Alignment is normal.  DISCS AND  DEGENERATIVE CHANGES: The lumbar disc spaces are well preserved. No severe degenerative changes.  SOFT TISSUES: Diffuse gaseous distention of the colon with moderate right-sided colonic stool burden. Signs of previous right total hip arthroplasty. No acute abnormality.  IMPRESSION: 1. Stable subacute fracture deformity involving the superior endplate of the L1 vertebra with less than 25 percent vertebral body height loss. No new fractures identified.  Electronically signed by: Waddell Calk MD 05/28/2024 06:18 AM EST RP Workstation: [id number] DG Abdomen Acute W/Chest EXAM: UPRIGHT AND SUPINE XRAY VIEWS OF THE ABDOMEN AND 4 VIEW(S) OF THE CHEST 05/28/2024 06:07:44 AM  COMPARISON: 05/25/2024  CLINICAL HISTORY: sob, constipation  FINDINGS:  LUNGS AND PLEURA: Mild elevation of the left hemidiaphragm with mild bibasilar atelectasis. No pleural effusion or pneumothorax.  HEART AND MEDIASTINUM: Overlying cardiac leads. No acute abnormality of the cardiac and mediastinal silhouettes.  BOWEL: Diffuse gaseous distention of the colon with moderate right-sided colonic stool burden.  PERITONEUM AND SOFT TISSUES: No abnormal calcifications. No free air.  BONES: Right total hip arthroplasty. No acute fracture.  IMPRESSION: 1. Diffuse gaseous distention of the colon with moderate right-sided colonic stool burden. 2. Mild bibasilar atelectasis with mild elevation of the left hemidiaphragm.  Electronically signed by: Waddell Calk MD 05/28/2024 06:16 AM EST RP Workstation: [id number]     Scheduled inpatient medications:   divalproex   250 mg Oral Daily   enoxaparin  (LOVENOX ) injection  40 mg Subcutaneous Q24H   ferrous sulfate   325 mg Oral Q breakfast   folic acid   1 mg Oral Daily   guaiFENesin   600 mg Oral BID   levETIRAcetam   250 mg Oral BID   LORazepam   0-4 mg Intravenous Q12H   nicotine   21 mg Transdermal Daily   pantoprazole  (PROTONIX ) IV  40 mg Intravenous Q12H    polyethylene glycol  17 g Oral BID   sodium chloride  flush  3 mL Intravenous Q12H   sucralfate   1 g Oral TID WC & HS   thiamine   100 mg Oral Daily   Or   thiamine   100 mg Intravenous Daily   Continuous inpatient infusions:  PRN inpatient medications: acetaminophen  **OR** acetaminophen , albuterol , labetalol , ondansetron  **OR** ondansetron  (ZOFRAN ) IV, oxyCODONE -acetaminophen   Vital signs in last 24 hours: Temp:  [97.5 F (36.4 C)-98.3 F (36.8 C)] 97.8 F (36.6 C) (12/10 0811) Pulse Rate:  [80-113] 95 (12/10 0811) Resp:  [18-22] 20 (12/10 0811) BP: (153-188)/(72-96) 157/72 (12/10 0811) SpO2:  [96 %-100 %] 96 % (12/10 0811) Last BM Date : 05/31/24  Intake/Output Summary (Last 24 hours) at 06/01/2024 1027 Last data filed at 06/01/2024 9188 Gross per 24 hour  Intake 800 ml  Output --  Net 800 ml    Intake/Output from previous day: 12/09 0701 - 12/10 0700 In: 500 [P.O.:500] Out: -  Intake/Output this shift: Total I/O In: 300 [P.O.:300] Out: -    Physical Exam:  General: Alert female in NAD Heart:  Regular rate and rhythm.  Pulmonary: Normal respiratory  effort.  Mildly decreased breath sounds RLL.  Abdomen: Soft, nondistended, nontender. Normal bowel sounds. Extremities: No lower extremity edema  Neurologic: Alert and oriented Psych: Pleasant. Cooperative     LOS: 3 days   Vina Dasen ,NP 06/01/2024, 10:28 AM

## 2024-06-01 NOTE — Plan of Care (Signed)

## 2024-06-01 NOTE — Care Management Important Message (Signed)
 Important Message  Patient Details  Name: [name] MRN: [id number] Date of Birth: September 25, 1965   Important Message Given:  Yes - Medicare IM     Claretta Deed 06/01/2024, 2:32 PM

## 2024-06-01 NOTE — Progress Notes (Signed)
 Progress Note   Patient: [name] FMW:[id number] DOB: 02/16/1966 DOA: 05/28/2024  DOS: the patient was seen and examined on 06/01/2024   Brief hospital course:  58 y.o. female with medical history significant of paroxysmal atrial fibrillation, COPD, diabetes mellitus type 2, seizures, anxiety, depression, and polysubstance abuse presents with lower back pain radiating to the right leg and abdominal pain.    Assessment and Plan:   Concern for partial small bowel obstruction/severe constipation - Initially presenting with abdominal pain, nausea, vomiting.  CT scan showing diffuse stool burden.  GI/surgery consulted.  GI initiating bowel prep with clear liquids.  Nausea and vomiting resolved.  Abdomen soft, nontender.  Plan to EGD/colonoscopy although initially limited by patient's behaviors, encephalopathy.  Attempting to encourage bowel prep p.o. today with plan for EGD colonoscopy tomorrow.  Iron  deficiency anemia (POA) - Likely from chronic blood loss.  Iron  studies noting low iron , percent sat, with normal TIBC.  Reported severe allergy to IV iron .  Per family member, required rapid response.  Will supplement with p.o. iron  for now.   Chronic hypoxic respiratory failure/COPD - Baseline O2 3-4 L nasal cannula.  Intermittently compliant.  No active wheezing appreciated.  Appears to be at baseline.  Nebulizers as needed.   Acute metabolic encephalopathy/schizophrenia/bipolar substance abuse -Possibly exacerbated by alcohol  withdrawal.  Patient has multiple episodes of leaving AMA.  Known schizoaffective disorder, bipolar, schizophrenia, substance-induced mood disorder/psychosis, alcohol  use disorder severe, polysubstance abuse (I.e., though largely crack cocaine).  Has been IVC in the past due to lack of decision making capacity.  Daughter currently in process of obtaining guardianship.  Depakote  on board.  Ativan  as needed.   Alcohol  abuse - Reports drinking a 40 daily.  May be  contributing to agitation and confusion along with underlying psych disorders.  CIWA on board, vitamin supplementation.   Cocaine abuse - Counseled on cessation.   Seizure disorder - Keppra  on board.   GERD with esophagitis/hiatal hernia - PPI, Carafate  on board.   Thoracic compression fracture (POA) - CT revealed unchanged compression fracture at T9.  Oxy on board.   Goals of care - Pending EGD/colonoscopy tomorrow.  Anticipate DC 1-2 days.   Subjective: Patient sitting on side of bed, appears more alert.  Talkative and calm.  Denies any fever, chills, chest pain, nausea, vomiting, abdominal pain.  Explained about possibility of starting bowel prep today.  Physical Exam:  Vitals:   06/01/24 0015 06/01/24 0434 06/01/24 0619 06/01/24 0811  BP: (!) 172/85 (!) 177/96 (!) 153/84 (!) 157/72  Pulse: (!) 107 94 80 95  Resp: 20 18 18 20   Temp: 97.7 F (36.5 C) 97.7 F (36.5 C) 97.9 F (36.6 C) 97.8 F (36.6 C)  TempSrc: Oral Oral Oral Oral  SpO2: 100% 100% 98% 96%  Height:        GENERAL:  Alert, pleasant, no acute distress  HEENT:  EOMI CARDIOVASCULAR:  RRR, no murmurs appreciated RESPIRATORY:  Clear to auscultation, no wheezing, rales, or rhonchi GASTROINTESTINAL:  Soft, nontender, nondistended EXTREMITIES:  No LE edema bilaterally NEURO:  No new focal deficits appreciated SKIN:  No rashes noted PSYCH: Impulsive   Data Reviewed:  Imaging Studies: CT ABDOMEN PELVIS W CONTRAST Result Date: 05/28/2024 EXAM: CT ABDOMEN AND PELVIS WITH CONTRAST 05/28/2024 08:48:03 AM TECHNIQUE: CT of the abdomen and pelvis was performed with the administration of 75 mL of iohexol  (OMNIPAQUE ) 350 MG/ML injection. Multiplanar reformatted images are provided for review. Automated exposure control, iterative reconstruction, and/or weight-based adjustment of  the mA/kV was utilized to reduce the radiation dose to as low as reasonably achievable. COMPARISON: 05/24/24 CLINICAL HISTORY: Abdominal pain,  acute, nonlocalized; worsening xray. FINDINGS: LOWER CHEST: Areas of subsegmental atelectasis are present within the lingula and in both lower lobes. A patchy area of ground-glass attenuation is noted within the medial left upper lobe. Coronary artery calcifications are present. LIVER: An unchanged, too small to characterize 4 mm hypodensity is noted in the right lobe of the liver. GALLBLADDER AND BILE DUCTS: Gallbladder is unremarkable. Fusiform dilatation of the common bile duct measures up to 8 mm, previously 6 mm. No calcified stone is identified within the common bile duct. SPLEEN: The spleen is within normal limits in size and appearance. PANCREAS: The pancreas is normal in size and contour, without focal lesion or ductal dilatation. ADRENAL GLANDS: No periadrenal stranding is present. No nodule, thickening, or hemorrhage is seen. KIDNEYS, URETERS AND BLADDER: The kidneys are normal in size and morphology bilaterally. No stones in the kidneys or ureters. No hydronephrosis. No perinephric or periureteral stranding. The urinary bladder is obscured by streak artifact from the patient's right hip arthroplasty device. GI AND BOWEL: Circumferential wall thickening of the distal esophagus with a small to moderate hiatal hernia is noted. Postoperative changes from previous gastric bypass surgery are present. No pathologic dilatation of the small bowel loops or signs of bowel inflammation are seen. Moderate colonic stool burden is noted involving the ascending colon. There is progressive gaseous distention of the redundant colon to the level of the descending colon with additional gaseous distention of the sigmoid colon. The distal sigmoid colon and rectum are decreased in caliber. A focal short segment of luminal narrowing at the junction between the descending colon and sigmoid colon is noted in the left hemiabdomen, on coronal image 65/6. There are nondilated loops of small bowel noted lateral to the dilated  descending colon, which may reflect an underlying internal hernia. PERITONEUM AND RETROPERITONEUM: No free fluid or fluid collections are identified. No free air. VASCULATURE: Aorta is normal in caliber. Aortic atherosclerosis and aortic atherosclerotic calcifications are present. The mesenteric vasculature is patent. LYMPH NODES: No signs of abdominopelvic adenopathy are seen. REPRODUCTIVE ORGANS: The uterus is not visualized and may be surgically absent. No signs of adnexal mass are seen. BONES AND SOFT TISSUES: Previous right hip arthroplasty is noted. An unchanged compression fracture involves the T9 vertebra. A subacute fracture involving the superior endplate of T9 is again seen with mild loss of the superior endplate height. Approximately 4 mm retropulsion of the superior posterior endplate is noted at this level. No new compression fractures are identified. No focal soft tissue abnormality. IMPRESSION: 1. Focal short segment luminal narrowing at the junction between the descending colon and sigmoid colon with associated gaseous distention of the redundant colon and sigmoid colon, and decreased caliber of the distal sigmoid colon and rectum. Nondilated small bowel loops lateral to the dilated descending colon may reflect an underlying internal hernia possibly resulting in partial obstruction of the left colon at the junction between the descending colon and sigmoid colon. 2. Fusiform dilatation of the common bile duct now measuring up to 8 mm, previously 6 mm. No calcified stone is identified. 3. Circumferential wall thickening of the distal esophagus with a small to moderate hiatal hernia. Electronically signed by: Waddell Calk MD 05/28/2024 09:27 AM EST RP Workstation: [id number]   DG Lumbar Spine Complete Result Date: 05/28/2024 EXAM: 4 VIEW(S) XRAY OF THE LUMBAR SPINE 05/28/2024 06:07:44 AM COMPARISON: Lumbar  spine MRI 05/25/2024. CLINICAL HISTORY: fall? FINDINGS: LUMBAR SPINE: BONES: Stable  appearance of the subacute fracture deformity involving the superior endplate of the L1 vertebra with less than 25 percent vertebral body height loss. No new fractures identified. The remaining lumbar vertebral body heights are well preserved. Alignment is normal. DISCS AND DEGENERATIVE CHANGES: The lumbar disc spaces are well preserved. No severe degenerative changes. SOFT TISSUES: Diffuse gaseous distention of the colon with moderate right-sided colonic stool burden. Signs of previous right total hip arthroplasty. No acute abnormality. IMPRESSION: 1. Stable subacute fracture deformity involving the superior endplate of the L1 vertebra with less than 25 percent vertebral body height loss. No new fractures identified. Electronically signed by: Waddell Calk MD 05/28/2024 06:18 AM EST RP Workstation: [id number]   DG Abdomen Acute W/Chest Result Date: 05/28/2024 EXAM: UPRIGHT AND SUPINE XRAY VIEWS OF THE ABDOMEN AND 4 VIEW(S) OF THE CHEST 05/28/2024 06:07:44 AM COMPARISON: 05/25/2024 CLINICAL HISTORY: sob, constipation FINDINGS: LUNGS AND PLEURA: Mild elevation of the left hemidiaphragm with mild bibasilar atelectasis. No pleural effusion or pneumothorax. HEART AND MEDIASTINUM: Overlying cardiac leads. No acute abnormality of the cardiac and mediastinal silhouettes. BOWEL: Diffuse gaseous distention of the colon with moderate right-sided colonic stool burden. PERITONEUM AND SOFT TISSUES: No abnormal calcifications. No free air. BONES: Right total hip arthroplasty. No acute fracture. IMPRESSION: 1. Diffuse gaseous distention of the colon with moderate right-sided colonic stool burden. 2. Mild bibasilar atelectasis with mild elevation of the left hemidiaphragm. Electronically signed by: Waddell Calk MD 05/28/2024 06:16 AM EST RP Workstation: [id number]   DG Knee Complete 4 Views Right Result Date: 05/25/2024 CLINICAL DATA:  Fall. EXAM: RIGHT KNEE - COMPLETE 4+ VIEW COMPARISON:  None Available. FINDINGS: No  evidence of fracture, dislocation, or joint effusion. There is moderate tricompartmental osteoarthrosis with joint space narrowing and osteophyte formation. Soft tissues are unremarkable. IMPRESSION: 1. No acute fracture or dislocation. 2. Moderate tricompartmental osteoarthrosis. Electronically Signed   By: Greig Pique M.D.   On: 05/25/2024 22:36   DG Chest 2 View Result Date: 05/25/2024 CLINICAL DATA:  Chest pain after fall EXAM: DG CHEST 2V COMPARISON:  Chest x-ray 05/21/2024 FINDINGS: The heart size and mediastinal contours are within normal limits. Both lungs are clear. The visualized skeletal structures are unremarkable. IMPRESSION: No active cardiopulmonary disease. Electronically Signed   By: Greig Pique M.D.   On: 05/25/2024 22:35   CT Cervical Spine Wo Contrast Result Date: 05/25/2024 CLINICAL DATA:  Status post trauma. EXAM: CT CERVICAL SPINE WITHOUT CONTRAST TECHNIQUE: Multidetector CT imaging of the cervical spine was performed without intravenous contrast. Multiplanar CT image reconstructions were also generated. RADIATION DOSE REDUCTION: This exam was performed according to the departmental dose-optimization program which includes automated exposure control, adjustment of the mA and/or kV according to patient size and/or use of iterative reconstruction technique. COMPARISON:  Oct 29, 2023 FINDINGS: Alignment: There is straightening of the normal cervical spine lordosis. Skull base and vertebrae: No acute fracture. No primary bone lesion or focal pathologic process. Soft tissues and spinal canal: No prevertebral fluid or swelling. No visible canal hematoma. Disc levels: Mild endplate sclerosis, anterior osteophyte formation and posterior bony spurring are seen at the levels of C3-C4, C4-C5 and C5-C6. Mild multilevel intervertebral disc space narrowing is seen throughout all levels of the cervical spine, slightly more prominent at the level of C2-C3. Mild, bilateral multilevel facet joint  hypertrophy is noted. Upper chest: Negative. Other: None. IMPRESSION: 1. No acute fracture or subluxation in the cervical spine. 2.  Mild multilevel degenerative changes. Electronically Signed   By: Suzen Dials M.D.   On: 05/25/2024 22:04   CT Head Wo Contrast Result Date: 05/25/2024 CLINICAL DATA:  Status post fall EXAM: CT HEAD WITHOUT CONTRAST TECHNIQUE: Contiguous axial images were obtained from the base of the skull through the vertex without intravenous contrast. RADIATION DOSE REDUCTION: This exam was performed according to the departmental dose-optimization program which includes automated exposure control, adjustment of the mA and/or kV according to patient size and/or use of iterative reconstruction technique. COMPARISON:  None Available. FINDINGS: Brain: No evidence of acute infarction, hemorrhage, hydrocephalus, extra-axial collection or mass lesion/mass effect. Vascular: Mild bilateral cavernous carotid artery calcification is noted. Skull: Normal. Negative for fracture or focal lesion. Sinuses/Orbits: No acute finding. Other: None. IMPRESSION: No acute intracranial abnormality. Electronically Signed   By: Suzen Dials M.D.   On: 05/25/2024 22:01   MR LUMBAR SPINE WO CONTRAST Result Date: 05/25/2024 EXAM: MRI LUMBAR SPINE 05/25/2024 07:55:00 PM TECHNIQUE: Multiplanar multisequence MRI of the lumbar spine was performed without the administration of intravenous contrast. COMPARISON: CT abdomen and pelvis from 06/13/2024, lumbar spine radiograph 01/2024. CLINICAL HISTORY: Recent lumbar fracture with new numbness to right leg and falls. FINDINGS: BONES AND ALIGNMENT: Normal alignment except for findings at L1. There is a recent likely subacute wedge compression fracture of L1 with less than 25% height loss and approximately 2 mm of retropulsion of the posterior superior corner. Mild bone marrow edema is present at L1. Normal vertebral body heights otherwise. Bone marrow signal is  unremarkable otherwise. SPINAL CORD: The conus terminates normally. SOFT TISSUES: No paraspinal mass. L1-L2: No significant disc herniation. No spinal canal stenosis or neural foraminal narrowing. L2-L3: No significant disc herniation. No spinal canal stenosis or neural foraminal narrowing. L3-L4: No significant disc herniation. No spinal canal stenosis or neural foraminal narrowing. L4-L5: Small disc bulge and moderate facet hypertrophy. No central spinal canal or neural foraminal stenosis. L5-S1: No significant disc herniation. Mild facet hypertrophy. No central spinal canal or neural foraminal stenosis. IMPRESSION: 1. Recent, likely subacute wedge compression fracture of L1 with less than 25% height loss, approximately 2 mm of retropulsion of the posterior superior corner, and mild bone marrow edema. Electronically signed by: Franky Stanford MD 05/25/2024 08:56 PM EST RP Workstation: [id number]   CT ABDOMEN PELVIS W CONTRAST Result Date: 05/24/2024 EXAM: CT ABDOMEN AND PELVIS WITH CONTRAST 05/24/2024 08:42:08 AM TECHNIQUE: CT of the abdomen and pelvis was performed with the administration of 80 mL of iohexol  (OMNIPAQUE ) 300 MG/ML solution. Multiplanar reformatted images are provided for review. Automated exposure control, iterative reconstruction, and/or weight-based adjustment of the mA/kV was utilized to reduce the radiation dose to as low as reasonably achievable. COMPARISON: Recent CT abdomen and pelvis 05/21/2024, and earlier. CLINICAL HISTORY: 58 year old female. Abdominal pain, acute, nonlocalized; Abdominal/flank pain, stone suspected. FINDINGS: LOWER CHEST: Mild lung base atelectasis, overall lung base ventilation has not significantly changed. LIVER: The liver is unremarkable. GALLBLADDER AND BILE DUCTS: Gallbladder is unremarkable. No biliary ductal dilatation. SPLEEN: No acute abnormality. PANCREAS: No acute abnormality. ADRENAL GLANDS: No acute abnormality. KIDNEYS, URETERS AND BLADDER: No stones in  the kidneys or ureters. No hydronephrosis. No perinephric or periureteral stranding. Urinary bladder is unremarkable. 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 The mid transverse colon is more gas distended today, but otherwise stable bowel loops, no evidence of mechanical obstruction. No free air, free fluid, or mesenteric inflammation identified. PERITONEUM  AND RETROPERITONEUM: No ascites. No free air. No free fluid. No mesenteric inflammation. VASCULATURE: Abdominal aortic atherosclerosis is moderate. Major arterial and portal venous structures in the abdomen and pelvis appear patent. Aorta is normal in caliber. LYMPH NODES: No lymphadenopathy. REPRODUCTIVE ORGANS: Streak artifact again limits visualization of the deep pelvis. 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 Acute or subacute mild L1 compression fracture. L1 superior endplate compression fracture is new. Mild superior endplate deformity. L1 now measures 19.3 mm versus 22.8 mm normally. This is a 15% loss of vertebral body height. Subtle retropulsion of the posterior superior endplate on series 9 image 94. No significant spinal stenosis or complicating features. 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 No other acute osseous abnormality identified. IMPRESSION: 1. Acute mild L1 compression fracture with 15% loss of height. No significant retropulsion or other complicating features. 2. No other acute or inflammatory process identified in the abdomen or pelvis. 3. Stable roux-en-y type gastric bypass. Calcified aortic atherosclerosis. Electronically signed by: Helayne Hurst MD 05/24/2024 08:52 AM EST RP Workstation: [id number]   CT ABDOMEN PELVIS W CONTRAST Result Date: 05/21/2024 CLINICAL DATA:  Upper  abdominal pain for 3 days with nausea and vomiting for 2 days. EXAM: CT ABDOMEN AND PELVIS WITH CONTRAST TECHNIQUE: Multidetector CT imaging of the abdomen and pelvis was performed using the standard protocol following bolus administration of intravenous contrast. RADIATION DOSE REDUCTION: This exam was performed according to the departmental dose-optimization program which includes automated exposure control, adjustment of the mA and/or kV according to patient size and/or use of iterative reconstruction technique. CONTRAST:  75mL OMNIPAQUE  IOHEXOL  350 MG/ML SOLN COMPARISON:  03/29/2024 and older exams. FINDINGS: Lower chest: Clear lung bases. Hepatobiliary: No focal liver abnormality is seen. No gallstones, gallbladder wall thickening, or biliary dilatation. Pancreas: Unremarkable. No pancreatic ductal dilatation or surrounding inflammatory changes. Spleen: Normal in size without focal abnormality. Adrenals/Urinary Tract: Normal adrenal glands. Normal kidneys and ureters. Bladder decompressed. Stomach/Bowel: Previous gastric bypass surgery. No evidence of bowel obstruction. No stomach or bowel wall thickening or inflammation. Vascular/Lymphatic: Aortic atherosclerosis. No aneurysm. No enlarged lymph nodes. Reproductive: Status post hysterectomy. No adnexal masses. Other: No hernia.  No ascites. Musculoskeletal: Chronic compression fracture of T9. No acute fracture. No bone lesion. 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 IMPRESSION: 1. No acute findings. 2. Previous Roux-en-Y gastric bypass, without evidence of bowel obstruction or inflammation and unchanged from the prior CT. 3. Aortic atherosclerosis. Electronically Signed   By: Alm Parkins M.D.   On: 05/21/2024 11:06   DG Chest 1 View Result Date: 05/21/2024 CLINICAL DATA:  Cough.  Shortness of breath. EXAM: CHEST  1 VIEW COMPARISON:  02/03/2024 FINDINGS: The heart size and mediastinal contours are within normal limits. Both  lungs are clear. The visualized skeletal structures are unremarkable. IMPRESSION: No active disease. Electronically Signed   By: Norleen DELENA Kil M.D.   On: 05/21/2024 09:42    There are no new results to review at this time.  Previous records (including but not limited to H&P, progress notes, nursing notes, TOC management) were reviewed in assessment of this patient.  Labs: CBC: Recent Labs  Lab 05/25/24 1835 05/28/24 0446 05/29/24 1058 05/30/24 0811 05/31/24 0439 06/01/24 0438  WBC 7.2 6.9 4.6 6.0 5.8 5.9  NEUTROABS 5.1 4.2  --   --   --   --  HGB 9.1* 9.1* 8.5* 9.2* 8.4* 9.7*  HCT 30.0* 30.6* 28.9* 31.4* 28.3* 33.2*  MCV 73.3* 76.7* 78.5* 77.9* 77.7* 77.0*  PLT 204 141* 160 189 195 218   Basic Metabolic Panel: Recent Labs  Lab 05/28/24 0446 05/29/24 1058 05/30/24 0811 05/31/24 0439 06/01/24 0438  NA 135 134* 143 141 138  K 4.0 3.9 4.0 4.2 4.4  CL 98 96* 101 100 100  CO2 24 27 36* 27 27  GLUCOSE 162* 209* 102* 105* 96  BUN <5* <5* <5* <5* <5*  CREATININE 0.55 0.61 0.57 0.55 0.90  CALCIUM  8.4* 7.8* 8.3* 8.2* 8.9  MG  --   --  2.1 1.9  --    Liver Function Tests: Recent Labs  Lab 05/25/24 1835 05/28/24 0446 05/29/24 1058  AST 73* 25 23  ALT 36 23 18  ALKPHOS 60 56 53  BILITOT 0.9 0.5 0.8  PROT 6.2* 6.4* 5.9*  ALBUMIN  3.1* 3.1* 2.8*   CBG: No results for input(s): GLUCAP in the last 168 hours.  Scheduled Meds:  divalproex   250 mg Oral Daily   enoxaparin  (LOVENOX ) injection  40 mg Subcutaneous Q24H   folic acid   1 mg Oral Daily   guaiFENesin   600 mg Oral BID   levETIRAcetam   250 mg Oral BID   Na Sulfate-K Sulfate-Mg Sulfate concentrate  0.5 kit Oral Once   Followed by   Na Sulfate-K Sulfate-Mg Sulfate concentrate  0.5 kit Oral Once   nicotine   21 mg Transdermal Daily   pantoprazole  (PROTONIX ) IV  40 mg Intravenous Q12H   polyethylene glycol powder  119 g Oral Once   simethicone   240 mg Oral Once   Followed by   simethicone   240 mg Oral Once    sodium chloride  flush  3 mL Intravenous Q12H   thiamine   100 mg Oral Daily   Or   thiamine   100 mg Intravenous Daily   Continuous Infusions:  sodium chloride      PRN Meds:.acetaminophen  **OR** acetaminophen , albuterol , labetalol , ondansetron  **OR** ondansetron  (ZOFRAN ) IV, oxyCODONE -acetaminophen   Family Communication: None at bedside  Disposition: Status is: Inpatient Remains inpatient appropriate because: EGD tomorrow     Time spent: 31 minutes  Length of inpatient stay: 3 days  Author: Carliss LELON Canales, DO 06/01/2024 11:46 AM  For on call review [email or web address].

## 2024-06-01 NOTE — Progress Notes (Addendum)
 Daily Progress Note  DOA: 05/28/2024 Hospital Day: 5  Cc: iron  deficiency anemia, abnormal and colon normal CT scan.   ASSESSMENT    58 yo female with abnormal CT scan - ?  possible partial colonic obstruction, esophageal wall thickening, also iron  deficiency anemia  Surgery evaluated, doubts colonic obstruction and has signed off. TODAY: Hemoglobin stable at 9.7.  We have been holding off on doing EGD and colonoscopy until mental status improved.  Patient is alert and oriented today, sitting up on the side of the bed.    Iron  deficiency anemia ( chronic) Hgb is stable at 9.7.  She has not required blood transfusion   Altered mental status Polysubstance abuse (Etoh, tobacco, cocaine) TODAY: Mental status improved. 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   DM2   Paroxysmal atrial fibrillation   History of seizures  Principal Problem:   Partial bowel obstruction (HCC) Active Problems:   COPD with acute exacerbation (HCC)   Abdominal pain   Schizoaffective disorder (HCC)   Seizure disorder (HCC)   Constipation   Iron  deficiency anemia   Tobacco abuse   Cocaine abuse (HCC)   Low back pain   Alcohol  abuse   Chronic respiratory failure with hypoxia (HCC)   Thoracic compression fracture (HCC)   GERD with esophagitis   Hiatal hernia   Thrombocytopenia   Colon stricture (HCC)   Abnormal CT scan, gastrointestinal tract   H/O gastric bypass   Altered mental status    PLAN   --Patient feels up to proceeding with EGD / colonoscopy tomorrow.  We spoke with patient's daughter yesterday and she would like for us  to proceed with EGD and colonoscopy as well. .  --EGD and colonoscopy tomorrow.  The risks and benefits of EGD with possible biopsies and colonoscopy with possible biopsies and removal of polyps were discussed with the patient  and her daughter and they agree to proceed. Will  ask that daughter sign consent form.  proceed. -- Concerned she may be difficult to prep given questionable colonic narrowing/obstruction.  Given this we will hold constipating medications including oral iron  and Carafate .  --Will give her half of a MiraLAX  prep today then full bowel prep this evening -- Will stop her Lovenox  after tonight's dose   Subjective   No physical complaints other than lingering back discomfort   Objective    Recent Labs    05/30/24 0811 05/31/24 0439 06/01/24 0438  WBC 6.0 5.8 5.9  HGB 9.2* 8.4* 9.7*  HCT 31.4* 28.3* 33.2*  MCV 77.9* 77.7* 77.0*  PLT 189 195 218   Recent Labs    05/30/24 0811  FERRITIN 23  TIBC 428  IRONPCTSAT 5*   Recent Labs    05/30/24 0811 05/31/24 0439 06/01/24 0438  NA 143 141 138  K 4.0 4.2 4.4  CL 101 100 100  CO2 36* 27 27  GLUCOSE 102* 105* 96  BUN <5* <5* <5*  CREATININE 0.57 0.55 0.90  CALCIUM  8.3* 8.2* 8.9   Recent Labs    05/29/24 1058  PROT 5.9*  ALBUMIN  2.8*  AST 23  ALT 18  ALKPHOS 53  BILITOT 0.8     Imaging:  CT ABDOMEN PELVIS W CONTRAST EXAM: CT ABDOMEN AND PELVIS WITH CONTRAST 05/28/2024 08:48:03 AM  TECHNIQUE: CT of the abdomen and pelvis was performed with the administration of 75 mL of iohexol  (OMNIPAQUE ) 350 MG/ML  injection. Multiplanar reformatted images are provided for review. Automated exposure control, iterative reconstruction, and/or weight-based adjustment of the mA/kV was utilized to reduce the radiation dose to as low as reasonably achievable.  COMPARISON: 05/24/24  CLINICAL HISTORY: Abdominal pain, acute, nonlocalized; worsening xray.  FINDINGS:  LOWER CHEST: Areas of subsegmental atelectasis are present within the lingula and in both lower lobes. A patchy area of ground-glass attenuation is noted within the medial left upper lobe. Coronary artery calcifications are present.  LIVER: An unchanged, too small to characterize 4 mm hypodensity is noted in the  right lobe of the liver.  GALLBLADDER AND BILE DUCTS: Gallbladder is unremarkable. Fusiform dilatation of the common bile duct measures up to 8 mm, previously 6 mm. No calcified stone is identified within the common bile duct.  SPLEEN: The spleen is within normal limits in size and appearance.  PANCREAS: The pancreas is normal in size and contour, without focal lesion or ductal dilatation.  ADRENAL GLANDS: No periadrenal stranding is present. No nodule, thickening, or hemorrhage is seen.  KIDNEYS, URETERS AND BLADDER: The kidneys are normal in size and morphology bilaterally. No stones in the kidneys or ureters. No hydronephrosis. No perinephric or periureteral stranding. The urinary bladder is obscured by streak artifact from the patient's right hip arthroplasty device.  GI AND BOWEL: Circumferential wall thickening of the distal esophagus with a small to moderate hiatal hernia is noted.  Postoperative changes from previous gastric bypass surgery are present. No pathologic dilatation of the small bowel loops or signs of bowel inflammation are seen.  Moderate colonic stool burden is noted involving the ascending colon. There is progressive gaseous distention of the redundant colon to the level of the descending colon with additional gaseous distention of the sigmoid colon. The distal sigmoid colon and rectum are decreased in caliber. A focal short segment of luminal narrowing at the junction between the descending colon and sigmoid colon is noted in the left hemiabdomen, on coronal image 65/6. There are nondilated loops of small bowel noted lateral to the dilated descending colon, which may reflect an underlying internal hernia.  PERITONEUM AND RETROPERITONEUM: No free fluid or fluid collections are identified. No free air.  VASCULATURE: Aorta is normal in caliber. Aortic atherosclerosis and aortic atherosclerotic calcifications are present. The mesenteric vasculature  is patent.  LYMPH NODES: No signs of abdominopelvic adenopathy are seen.  REPRODUCTIVE ORGANS: The uterus is not visualized and may be surgically absent. No signs of adnexal mass are seen.  BONES AND SOFT TISSUES: Previous right hip arthroplasty is noted. An unchanged compression fracture involves the T9 vertebra. A subacute fracture involving the superior endplate of T9 is again seen with mild loss of the superior endplate height. Approximately 4 mm retropulsion of the superior posterior endplate is noted at this level. No new compression fractures are identified. No focal soft tissue abnormality.  IMPRESSION: 1. Focal short segment luminal narrowing at the junction between the descending colon and sigmoid colon with associated gaseous distention of the redundant colon and sigmoid colon, and decreased caliber of the distal sigmoid colon and rectum. Nondilated small bowel loops lateral to the dilated descending colon may reflect an underlying internal hernia possibly resulting in partial obstruction of the left colon at the junction between the descending colon and sigmoid colon. 2. Fusiform dilatation of the common bile duct now measuring up to 8 mm, previously 6 mm. No calcified stone is identified. 3. Circumferential wall thickening of the distal esophagus with a small to moderate hiatal  hernia.  Electronically signed by: Waddell Calk MD 05/28/2024 09:27 AM EST RP Workstation: [id number] DG Lumbar Spine Complete EXAM: 4 VIEW(S) XRAY OF THE LUMBAR SPINE 05/28/2024 06:07:44 AM  COMPARISON: Lumbar spine MRI 05/25/2024.  CLINICAL HISTORY: fall?  FINDINGS:  LUMBAR SPINE: BONES: Stable appearance of the subacute fracture deformity involving the superior endplate of the L1 vertebra with less than 25 percent vertebral body height loss. No new fractures identified. The remaining lumbar vertebral body heights are well preserved. Alignment is normal.  DISCS AND  DEGENERATIVE CHANGES: The lumbar disc spaces are well preserved. No severe degenerative changes.  SOFT TISSUES: Diffuse gaseous distention of the colon with moderate right-sided colonic stool burden. Signs of previous right total hip arthroplasty. No acute abnormality.  IMPRESSION: 1. Stable subacute fracture deformity involving the superior endplate of the L1 vertebra with less than 25 percent vertebral body height loss. No new fractures identified.  Electronically signed by: Waddell Calk MD 05/28/2024 06:18 AM EST RP Workstation: [id number] DG Abdomen Acute W/Chest EXAM: UPRIGHT AND SUPINE XRAY VIEWS OF THE ABDOMEN AND 4 VIEW(S) OF THE CHEST 05/28/2024 06:07:44 AM  COMPARISON: 05/25/2024  CLINICAL HISTORY: sob, constipation  FINDINGS:  LUNGS AND PLEURA: Mild elevation of the left hemidiaphragm with mild bibasilar atelectasis. No pleural effusion or pneumothorax.  HEART AND MEDIASTINUM: Overlying cardiac leads. No acute abnormality of the cardiac and mediastinal silhouettes.  BOWEL: Diffuse gaseous distention of the colon with moderate right-sided colonic stool burden.  PERITONEUM AND SOFT TISSUES: No abnormal calcifications. No free air.  BONES: Right total hip arthroplasty. No acute fracture.  IMPRESSION: 1. Diffuse gaseous distention of the colon with moderate right-sided colonic stool burden. 2. Mild bibasilar atelectasis with mild elevation of the left hemidiaphragm.  Electronically signed by: Waddell Calk MD 05/28/2024 06:16 AM EST RP Workstation: [id number]     Scheduled inpatient medications:   divalproex   250 mg Oral Daily   enoxaparin  (LOVENOX ) injection  40 mg Subcutaneous Q24H   ferrous sulfate   325 mg Oral Q breakfast   folic acid   1 mg Oral Daily   guaiFENesin   600 mg Oral BID   levETIRAcetam   250 mg Oral BID   LORazepam   0-4 mg Intravenous Q12H   nicotine   21 mg Transdermal Daily   pantoprazole  (PROTONIX ) IV  40 mg Intravenous Q12H    polyethylene glycol  17 g Oral BID   sodium chloride  flush  3 mL Intravenous Q12H   sucralfate   1 g Oral TID WC & HS   thiamine   100 mg Oral Daily   Or   thiamine   100 mg Intravenous Daily   Continuous inpatient infusions:  PRN inpatient medications: acetaminophen  **OR** acetaminophen , albuterol , labetalol , ondansetron  **OR** ondansetron  (ZOFRAN ) IV, oxyCODONE -acetaminophen   Vital signs in last 24 hours: Temp:  [97.5 F (36.4 C)-98.3 F (36.8 C)] 97.8 F (36.6 C) (12/10 0811) Pulse Rate:  [80-113] 95 (12/10 0811) Resp:  [18-22] 20 (12/10 0811) BP: (153-188)/(72-96) 157/72 (12/10 0811) SpO2:  [96 %-100 %] 96 % (12/10 0811) Last BM Date : 05/31/24  Intake/Output Summary (Last 24 hours) at 06/01/2024 1027 Last data filed at 06/01/2024 9188 Gross per 24 hour  Intake 800 ml  Output --  Net 800 ml    Intake/Output from previous day: 12/09 0701 - 12/10 0700 In: 500 [P.O.:500] Out: -  Intake/Output this shift: Total I/O In: 300 [P.O.:300] Out: -    Physical Exam:  General: Alert female in NAD Heart:  Regular rate and rhythm.  Pulmonary: Normal respiratory  effort.  Mildly decreased breath sounds RLL.  Abdomen: Soft, nondistended, nontender. Normal bowel sounds. Extremities: No lower extremity edema  Neurologic: Alert and oriented Psych: Pleasant. Cooperative     LOS: 3 days   Vina Dasen ,NP 06/01/2024, 10:28 AM

## 2024-06-02 ENCOUNTER — Encounter (HOSPITAL_COMMUNITY)
Admission: EM | Disposition: A | Discharge status: Home / Self Care | Payer: Self-pay | Source: Home / Self Care | Attending: Internal Medicine

## 2024-06-02 ENCOUNTER — Encounter (HOSPITAL_COMMUNITY): Payer: Self-pay | Admitting: Internal Medicine

## 2024-06-02 ENCOUNTER — Inpatient Hospital Stay (HOSPITAL_COMMUNITY): Admitting: Certified Registered Nurse Anesthetist

## 2024-06-02 DIAGNOSIS — D126 Benign neoplasm of colon, unspecified: Secondary | ICD-10-CM

## 2024-06-02 DIAGNOSIS — Z9884 Bariatric surgery status: Secondary | ICD-10-CM | POA: Diagnosis not present

## 2024-06-02 DIAGNOSIS — I10 Essential (primary) hypertension: Secondary | ICD-10-CM

## 2024-06-02 DIAGNOSIS — D123 Benign neoplasm of transverse colon: Secondary | ICD-10-CM

## 2024-06-02 DIAGNOSIS — F259 Schizoaffective disorder, unspecified: Secondary | ICD-10-CM | POA: Diagnosis not present

## 2024-06-02 DIAGNOSIS — F1010 Alcohol abuse, uncomplicated: Secondary | ICD-10-CM | POA: Diagnosis not present

## 2024-06-02 DIAGNOSIS — J441 Chronic obstructive pulmonary disease with (acute) exacerbation: Secondary | ICD-10-CM | POA: Diagnosis not present

## 2024-06-02 DIAGNOSIS — S22070G Wedge compression fracture of T9-T10 vertebra, subsequent encounter for fracture with delayed healing: Secondary | ICD-10-CM | POA: Diagnosis not present

## 2024-06-02 DIAGNOSIS — Z720 Tobacco use: Secondary | ICD-10-CM | POA: Diagnosis not present

## 2024-06-02 DIAGNOSIS — J449 Chronic obstructive pulmonary disease, unspecified: Secondary | ICD-10-CM

## 2024-06-02 DIAGNOSIS — D500 Iron deficiency anemia secondary to blood loss (chronic): Secondary | ICD-10-CM | POA: Diagnosis not present

## 2024-06-02 DIAGNOSIS — F1410 Cocaine abuse, uncomplicated: Secondary | ICD-10-CM | POA: Diagnosis not present

## 2024-06-02 DIAGNOSIS — D509 Iron deficiency anemia, unspecified: Secondary | ICD-10-CM

## 2024-06-02 HISTORY — PX: COLONOSCOPY: SHX5424

## 2024-06-02 HISTORY — PX: ESOPHAGOGASTRODUODENOSCOPY: SHX5428

## 2024-06-02 MED ORDER — HALOPERIDOL LACTATE 5 MG/ML IJ SOLN
2.0000 mg | Freq: Once | INTRAMUSCULAR | Status: AC
  Administered 2024-06-02: 2 mg via INTRAVENOUS
  Filled 2024-06-02: qty 1

## 2024-06-02 MED ORDER — SODIUM CHLORIDE 0.9 % IV SOLN
300.0000 mg | Freq: Once | INTRAVENOUS | Status: AC
  Administered 2024-06-02: 300 mg via INTRAVENOUS
  Filled 2024-06-02: qty 15

## 2024-06-02 MED ORDER — TRAZODONE HCL 50 MG PO TABS
50.0000 mg | ORAL_TABLET | Freq: Every evening | ORAL | Status: DC | PRN
  Administered 2024-06-02: 50 mg via ORAL
  Filled 2024-06-02: qty 1

## 2024-06-02 MED ORDER — IRON SUCROSE 300 MG IVPB - SIMPLE MED
300.0000 mg | Freq: Once | Status: DC
  Filled 2024-06-02: qty 265

## 2024-06-02 MED ORDER — PROPOFOL 10 MG/ML IV BOLUS
INTRAVENOUS | Status: DC | PRN
  Administered 2024-06-02 (×2): 20 mg via INTRAVENOUS
  Administered 2024-06-02 (×2): 30 mg via INTRAVENOUS

## 2024-06-02 MED ORDER — DIPHENHYDRAMINE-ZINC ACETATE 2-0.1 % EX CREA
TOPICAL_CREAM | Freq: Three times a day (TID) | CUTANEOUS | Status: DC | PRN
  Filled 2024-06-02: qty 28

## 2024-06-02 MED ORDER — LORAZEPAM 2 MG/ML IJ SOLN
1.0000 mg | Freq: Four times a day (QID) | INTRAMUSCULAR | Status: DC | PRN
  Administered 2024-06-02 (×2): 1 mg via INTRAVENOUS
  Filled 2024-06-02 (×2): qty 1

## 2024-06-02 MED ORDER — PROPOFOL 500 MG/50ML IV EMUL
INTRAVENOUS | Status: DC | PRN
  Administered 2024-06-02: 125 ug/kg/min via INTRAVENOUS

## 2024-06-02 MED ORDER — SODIUM CHLORIDE 0.9 % IV SOLN
INTRAVENOUS | Status: AC | PRN
  Administered 2024-06-02: 500 mL via INTRAVENOUS

## 2024-06-02 MED ORDER — DEXMEDETOMIDINE HCL IN NACL 80 MCG/20ML IV SOLN
INTRAVENOUS | Status: DC | PRN
  Administered 2024-06-02 (×3): 4 ug via INTRAVENOUS

## 2024-06-02 MED ORDER — PHENYLEPHRINE 80 MCG/ML (10ML) SYRINGE FOR IV PUSH (FOR BLOOD PRESSURE SUPPORT)
PREFILLED_SYRINGE | INTRAVENOUS | Status: DC | PRN
  Administered 2024-06-02: 160 ug via INTRAVENOUS

## 2024-06-02 NOTE — Plan of Care (Signed)

## 2024-06-02 NOTE — Interval H&P Note (Signed)
 History and Physical Interval Note: Patient reportedly tolerated her bowel prep well overnight.  She states she is clear.  She denies any complaints this morning, I think stable for endoscopy and colonoscopy.  We will evaluate her iron  deficiency and abnormal CT scan findings.  Discussed risk benefits of the procedure and anesthesia and they understand and want proceed.  Further recommendations pending the results.  06/02/2024 9:08 AM  [name]  has presented today for surgery, with the diagnosis of Iron  deficiency anemia.  The various methods of treatment have been discussed with the patient and family. After consideration of risks, benefits and other options for treatment, the patient has consented to  Procedures: COLONOSCOPY (N/A) EGD (ESOPHAGOGASTRODUODENOSCOPY) (N/A) as a surgical intervention.  The patient's history has been reviewed, patient examined, no change in status, stable for surgery.  I have reviewed the patient's chart and labs.  Questions were answered to the patient's satisfaction.     Elspeth P Norrine Ballester

## 2024-06-02 NOTE — Anesthesia Preprocedure Evaluation (Signed)
 Anesthesia Evaluation  Patient identified by MRN, date of birth, ID band Patient awake    Reviewed: Allergy & Precautions, NPO status , Patient's Chart, lab work & pertinent test results  Airway Mallampati: II  TM Distance: >3 FB Neck ROM: Full    Dental no notable dental hx.    Pulmonary COPD, Current Smoker   Pulmonary exam normal        Cardiovascular hypertension, + dysrhythmias Atrial Fibrillation  Rhythm:Regular Rate:Normal     Neuro/Psych  Headaches, Seizures -,   Anxiety Depression  Schizophrenia     GI/Hepatic Neg liver ROS, hiatal hernia,GERD  ,,  Endo/Other  diabetes, Type 2, Oral Hypoglycemic AgentsHypothyroidism    Renal/GU negative Renal ROS  negative genitourinary   Musculoskeletal negative musculoskeletal ROS (+)    Abdominal Normal abdominal exam  (+)   Peds  Hematology  (+) Blood dyscrasia, anemia Lab Results      Component                Value               Date                      WBC                      5.9                 06/01/2024                HGB                      9.7 (L)             06/01/2024                HCT                      33.2 (L)            06/01/2024                MCV                      77.0 (L)            06/01/2024                PLT                      218                 06/01/2024             Lab Results      Component                Value               Date                      NA                       138                 06/01/2024                K  4.4                 06/01/2024                CO2                      27                  06/01/2024                GLUCOSE                  96                  06/01/2024                BUN                      <5 (L)              06/01/2024                CREATININE               0.90                06/01/2024                CALCIUM                   8.9                 06/01/2024                 GFR                      87.26               11/04/2011                EGFR                     110                 02/05/2024                GFRNONAA                 >60                 06/01/2024              Anesthesia Other Findings   Reproductive/Obstetrics                              Anesthesia Physical Anesthesia Plan  ASA: 3  Anesthesia Plan: MAC   Post-op Pain Management:    Induction: Intravenous  PONV Risk Score and Plan: 1 and Propofol  infusion and Treatment may vary due to age or medical condition  Airway Management Planned: Simple Face Mask and Nasal Cannula  Additional Equipment: None  Intra-op Plan:   Post-operative Plan:   Informed Consent: I have reviewed the patients History and Physical, chart, labs and discussed the procedure including the risks, benefits and alternatives for the proposed anesthesia with the patient or authorized representative who has indicated his/her understanding and acceptance.     Dental advisory given  Plan Discussed with: CRNA  Anesthesia  Plan Comments:         Anesthesia Quick Evaluation

## 2024-06-02 NOTE — Op Note (Signed)
 [facility] Patient Name: [name] Procedure Date : 06/02/2024 MRN: [id number] Attending MD: Elspeth SQUIBB. Leigh , MD, [id number] Date of Birth: 1965/06/25 CSN: [id number] Age: 58 Admit Type: Inpatient Procedure:                Colonoscopy Indications:              Iron  deficiency anemia, Abnormal CT of the GI tract                            (narrowing at junction of descending colon /                            sigmoid) Providers:                Elspeth SQUIBB. Leigh, MD, Darleene Bare, RN, Curtistine Bishop, Technician Referring MD:              Medicines:                Monitored Anesthesia Care Complications:            No immediate complications. Estimated blood loss:                            Minimal. Estimated Blood Loss:     Estimated blood loss was minimal. Procedure:                Pre-Anesthesia Assessment:                           - Prior to the procedure, a History and Physical                            was performed, and patient medications and                            allergies were reviewed. The patient's tolerance of                            previous anesthesia was also reviewed. The risks                            and benefits of the procedure and the sedation                            options and risks were discussed with the patient.                            All questions were answered, and informed consent                            was obtained. Prior Anticoagulants: The patient has                            taken  no anticoagulant or antiplatelet agents. ASA                            Grade Assessment: III - A patient with severe                            systemic disease. After reviewing the risks and                            benefits, the patient was deemed in satisfactory                            condition to undergo the procedure.                           After obtaining informed consent, the  colonoscope                            was passed under direct vision. Throughout the                            procedure, the patient's blood pressure, pulse, and                            oxygen  saturations were monitored continuously. The                            PCF-H190TL ([id number] Olympus colonoscope was                            introduced through the anus and advanced to the the                            cecum, identified by appendiceal orifice and                            ileocecal valve. The colonoscopy was performed                            without difficulty. The patient tolerated the                            procedure well. The quality of the bowel                            preparation was adequate. The ileocecal valve,                            appendiceal orifice, and rectum were photographed. Scope In: 10:16:04 AM Scope Out: 10:47:15 AM Scope Withdrawal Time: 0 hours 24 minutes 59 seconds  Total Procedure Duration: 0 hours 31 minutes 11 seconds  Findings:      The perianal and digital rectal examinations were normal.      Four sessile polyps were found in the transverse colon. The polyps  were       3 to 4 mm in size. These polyps were removed with a cold snare.       Resection and retrieval were complete.      The colon was extremely tortuous - ultraslim pediatric colonoscope used       to perform this exam.      Internal hemorrhoids were found during retroflexion.      The exam was otherwise without abnormality. No focal abnormality to       correlate with CT findings - no mass lesions. Impression:               - Four 3 to 4 mm polyps in the transverse colon,                            removed with a cold snare. Resected and retrieved.                           - Tortuous colon.                           - Internal hemorrhoids.                           - The examination was otherwise normal.                           No cause for iron  deficiency on  colonoscopy, which                            may most likely be due post gastric bypass state                            and that she is not taking iron  supplementation. Recommendation:           - Return patient to hospital ward for ongoing care.                           - Advance diet as tolerated.                           - Continue present medications.                           - Await pathology results.                           - IV iron  while hospitalized, then ferrous sulfate                             325mg  / day at home                           - Trend Hgb upon discharge                           - Take Miralax  twice daily to treat constipation                           -  GI will sign off for now, we can coordinate                            follow up labs as outpatient Procedure Code(s):        --- Professional ---                           [phone], Colonoscopy, flexible; with removal of                            tumor(s), polyp(s), or other lesion(s) by snare                            technique Diagnosis Code(s):        --- Professional ---                           D12.3, Benign neoplasm of transverse colon (hepatic                            flexure or splenic flexure)                           K64.8, Other hemorrhoids                           D50.9, Iron  deficiency anemia, unspecified                           R93.3, Abnormal findings on diagnostic imaging of                            other parts of digestive tract                           Q43.8, Other specified congenital malformations of                            intestine CPT copyright 2022 American Medical Association. All rights reserved. The codes documented in this report are preliminary and upon coder review may  be revised to meet current compliance requirements. Elspeth P. Kohner Orlick, MD 06/02/2024 11:02:10 AM This report has been signed electronically. Number of Addenda: 0

## 2024-06-02 NOTE — Plan of Care (Signed)

## 2024-06-02 NOTE — TOC Progression Note (Signed)
 Transition of Care [facility]) - Progression Note    Patient Details  Name: [name] MRN: [id number] Date of Birth: [birth year]  [facility] CM/SW Contact  [name], RN Phone Number: 06/02/2024, 9:04 AM  Clinical Narrative:    CM spoke with the patient's daughter by phone and she states that she has applied for guardianship and that patient currently has an open case with Christopher Squibb, APS SW.  She states that she has been in contact with the patient's GAL through [facility] courts but he will not return her calls.  Patient lives at home and has planned GI procedure today.  Patient received medications this morning for insomnia as noted in the nursing/ MD notes.  Patient has home oxygen  at 4-5 L/min [place] and an albuterol  inhaler.  She has RW at home but does not use it.  [name], [name] has updated that the plan is for patient to return home with pending court hearing in the future for the daughter to obtain legal guardianship.                       Expected Discharge Plan and Services                                               Social Drivers of Health (SDOH) Interventions SDOH Screenings   Food Insecurity: Food Insecurity Present (05/28/2024)  Housing: High Risk (05/28/2024)  Transportation Needs: Unmet Transportation Needs (05/28/2024)  Utilities: Not At Risk (05/28/2024)  Alcohol  Screen: High Risk (02/14/2024)  Depression (PHQ2-9): High Risk (02/05/2024)  Financial Resource Strain: Low Risk (02/05/2024)  Physical Activity: Inactive (02/05/2024)  Social Connections: Socially Isolated (02/05/2024)  Stress: Stress Concern Present (02/05/2024)  Tobacco Use: High Risk (05/28/2024)    Readmission Risk Interventions    05/31/2024    2:28 PM 12/02/2023   12:47 PM 11/05/2023    1:34 PM  Readmission Risk Prevention Plan  Transportation Screening Complete Complete Complete  Medication Review (RN Care Manager) Referral to  Pharmacy Referral to Pharmacy Referral to Pharmacy  PCP or Specialist appointment within 3-5 days of discharge Complete Complete Complete  HRI or Home Care Consult Complete Complete Complete  SW Recovery Care/Counseling Consult Complete Complete Complete  Palliative Care Screening Not Applicable Not Applicable Not Applicable  Skilled Nursing Facility Not Applicable Not Applicable Not Applicable

## 2024-06-02 NOTE — Progress Notes (Signed)
 [facility] night cross cover note:   Patient requesting something for sleep, noting that she received doses of Haldol  earlier this hospitalization, which were effective in getting her some sleep.  She is not currently able to take her home nightly trazodone  in the setting of current n.p.o. status.  In light of the above, ordered Haldol  2 mg IV x 1 dose now and placed order to resume her home evening trazodone  prn for insomnia, with first eligible dose to occur this evening following plan resumption of diet after today's scheduled procedure.    Eva Pore, DO Hospitalist

## 2024-06-02 NOTE — Progress Notes (Signed)
 Progress Note   Patient: [name] FMW:[id number] DOB: July 19, 1965 DOA: 05/28/2024     4 DOS: the patient was seen and examined on 06/02/2024   Brief hospital course: 58 y.o. female with medical history significant of paroxysmal atrial fibrillation, COPD, diabetes mellitus type 2, seizures, anxiety, depression, and polysubstance abuse presents with lower back pain radiating to the right leg and abdominal pain.   Assessment and Plan: Concern for partial small bowel obstruction/severe constipation Presented with abdominal pain, nausea, vomiting.   CT scan showing diffuse stool burden.  GI/surgery consulted.   GI initiating bowel prep with clear liquids.  Nausea and vomiting resolved.   S/p EGD-esophageal gastric landmarks identified, 1 cm hiatal hernia, Roux-en-Y gastrojejunostomy with gastrojejunal anastomosis, bypass state could be associated with an iron  deficiency, but no concerning pathology on EGD. S/p colonoscopy -3 to 4 mm polyps x 4 in transverse colon removed, tortuous colon, internal hemorrhoids but no cause for iron  deficiency on colonoscopy noted. GI advised IV iron  therapy, pharmacy suggested IV sucrose as she has allergy to IV dextran.  IV sucrose 300 mg order placed. Discussed above findings with patient's daughter.  Plan to send her tomorrow home with daughter.   Iron  deficiency anemia (POA) Likely in the setting of gastric bypass.  Will give IV supplement iron  sucrose while in the hospital. Will supplement with p.o. iron  upon discharge   Chronic hypoxic respiratory failure/COPD- Baseline O2 3-4 L nasal cannula mostly at nights. Appears to be at baseline.  Nebulizers as needed.   Acute metabolic encephalopathy/schizophrenia/bipolar substance abuse Possibly exacerbated by alcohol  withdrawal.  Patient has multiple episodes of leaving AMA.  Known schizoaffective disorder, bipolar, schizophrenia, substance-induced mood disorder/psychosis, alcohol  use disorder severe,  polysubstance abuse (I.e., though largely crack cocaine).   Has been IVC in the past due to lack of decision making capacity.  Daughter currently in process of obtaining guardianship.  Depakote  on board.  Ativan  as needed.   Alcohol  abuse May be contributing to agitation and confusion along with underlying psych disorders.  CIWA on board, vitamin supplementation.   Cocaine abuse Counseled on cessation.   Seizure disorder Continue Keppra , Depakote    GERD with esophagitis/hiatal hernia Continue PPI, Carafate .   Thoracic compression fracture (POA) CT revealed unchanged compression fracture at T9.  Denies any pain at this time.  She is able to ambulate well.   Disposition-Home with daughter.  Pending guardianship.     Out of bed to chair. Incentive spirometry. Nursing supportive care. Fall, aspiration precautions. Diet:  Diet Orders (From admission, onward)     Start     Ordered   06/02/24 1108  Diet regular Room service appropriate? Yes; Fluid consistency: Thin  Diet effective now       Question Answer Comment  Room service appropriate? Yes   Fluid consistency: Thin      06/02/24 1107           DVT prophylaxis:   Level of care: Telemetry   Code Status: Full Code  Subjective: Patient is seen and examined today morning after the GI procedure.  She is sitting in chair, wishes to eat.  Sitter at bedside.  Denies any complaints of nausea, vomiting, abdominal discomfort.  RN noted that she is on and off agitated.  Physical Exam: Vitals:   06/02/24 1054 06/02/24 1100 06/02/24 1110 06/02/24 1111  BP: (!) 149/83  (!) 150/80 (!) 149/83  Pulse: 84 84 86 85  Resp: (!) 21 (!) 22 19 18   Temp: (!) 97  F (36.1 C)     TempSrc: Temporal     SpO2: 96% 96% 96% 96%  Weight:      Height:        General - Elderly African-American female, no apparent distress HEENT - PERRLA, EOMI, atraumatic head, non tender sinuses. Lung - Clear, diffuse rhonchi, wheezes. Heart - S1, S2 heard,  no murmurs, rubs, trace pedal edema. Abdomen - Soft, non tender, bowel sounds good Neuro - Alert, awake and oriented, non focal exam. Skin - Warm and dry.  Data Reviewed:      Latest Ref Rng & Units 06/01/2024    4:38 AM 05/31/2024    4:39 AM 05/30/2024    8:11 AM  CBC  WBC 4.0 - 10.5 K/uL 5.9  5.8  6.0   Hemoglobin 12.0 - 15.0 g/dL 9.7  8.4  9.2   Hematocrit 36.0 - 46.0 % 33.2  28.3  31.4   Platelets 150 - 400 K/uL 218  195  189       Latest Ref Rng & Units 06/01/2024    4:38 AM 05/31/2024    4:39 AM 05/30/2024    8:11 AM  BMP  Glucose 70 - 99 mg/dL 96  894  897   BUN 6 - 20 mg/dL <5  <5  <5   Creatinine 0.44 - 1.00 mg/dL 9.09  9.44  9.42   Sodium 135 - 145 mmol/L 138  141  143   Potassium 3.5 - 5.1 mmol/L 4.4  4.2  4.0   Chloride 98 - 111 mmol/L 100  100  101   CO2 22 - 32 mmol/L 27  27  36   Calcium  8.9 - 10.3 mg/dL 8.9  8.2  8.3    No results found.  Family Communication: Discussed with patient's daughter over phone, understand and agree. All questions answered.  Disposition: Status is: Inpatient Remains inpatient appropriate because: Monitor H&H, mental status  Planned Discharge Destination: Home     Time spent: 43 minutes  Author: Concepcion Riser, MD 06/02/2024 3:33 PM Secure chat 7am to 7pm For on call review [email or web address].

## 2024-06-02 NOTE — Transfer of Care (Signed)
 Immediate Anesthesia Transfer of Care Note  Patient: [name]  Procedure(s) Performed: COLONOSCOPY EGD (ESOPHAGOGASTRODUODENOSCOPY)  Patient Location: Endoscopy Unit  Anesthesia Type:MAC  Level of Consciousness: drowsy and patient cooperative  Airway & Oxygen  Therapy: Patient Spontanous Breathing and Patient connected to nasal cannula oxygen   Post-op Assessment: Report given to RN, Post -op Vital signs reviewed and stable, and Patient moving all extremities X 4  Post vital signs: Reviewed and stable  Last Vitals:  VSS before leaving patient, see flowsheet   Last Pain:  Vitals:   06/02/24 0906  TempSrc:   PainSc: 0-No pain      Patients Stated Pain Goal: 0 (05/29/24 0503)  Complications: No notable events documented.

## 2024-06-02 NOTE — Op Note (Signed)
 [facility] Patient Name: [name] Procedure Date : 06/02/2024 MRN: [id number] Attending MD: [name] , MD, [id number] Date of Birth: 01-Mar-1966 CSN: [id number] Age: 58 Admit Type: Inpatient Procedure:                Upper GI endoscopy Indications:              Iron  deficiency anemia, Abnormal CT of the GI tract                            (thickening of the distal esophagus) Providers:                [name] SQUIBB. Leigh, MD, [name] Bare, RN, [name], Technician Referring MD:              Medicines:                Monitored Anesthesia Care Complications:            No immediate complications. Estimated blood loss:                            None. Estimated Blood Loss:     Estimated blood loss: none. Procedure:                Pre-Anesthesia Assessment:                           - Prior to the procedure, a History and Physical                            was performed, and patient medications and                            allergies were reviewed. The patient's tolerance of                            previous anesthesia was also reviewed. The risks                            and benefits of the procedure and the sedation                            options and risks were discussed with the patient.                            All questions were answered, and informed consent                            was obtained. Prior Anticoagulants: The patient has                            taken no anticoagulant or antiplatelet agents. ASA  Grade Assessment: III - A patient with severe                            systemic disease. After reviewing the risks and                            benefits, the patient was deemed in satisfactory                            condition to undergo the procedure.                           After obtaining informed consent, the endoscope was                            passed  under direct vision. Throughout the                            procedure, the patient's blood pressure, pulse, and                            oxygen  saturations were monitored continuously. The                            [id number]-H190 ([id number]) Olympus endoscope was introduced                            through the mouth, and advanced to the proximal                            jejunum. The upper GI endoscopy was accomplished                            without difficulty. The patient tolerated the                            procedure well. Scope In: Scope Out: Findings:      Esophagogastric landmarks were identified: the Z-line was found at 33       cm, the gastroesophageal junction was found at 33 cm and the upper       extent of the gastric folds was found at 34 cm from the incisors.      A 1 cm hiatal hernia was present.      The exam of the esophagus was otherwise normal.      Evidence of a Roux-en-Y gastrojejunostomy was found. The gastrojejunal       anastomosis was characterized by healthy appearing mucosa.      The exam of the stomach was otherwise normal.      The examined jejunum was normal. Impression:               - Esophagogastric landmarks identified.                           - 1 cm hiatal hernia.                           -  Roux-en-Y gastrojejunostomy with gastrojejunal                            anastomosis characterized by healthy appearing                            mucosa.                           - Normal examined jejunum.                           Gastric bypass state could be associated with IDA                            if she is not taking iron , no other concerning                            pathology on EGD. Recommendation:           - Proceed with colonoscopy                           - Full recommendations per colonoscopy note Procedure Code(s):        --- Professional ---                           [phone], Esophagogastroduodenoscopy, flexible,                             transoral; diagnostic, including collection of                            specimen(s) by brushing or washing, when performed                            (separate procedure) Diagnosis Code(s):        --- Professional ---                           K44.9, Diaphragmatic hernia without obstruction or                            gangrene                           Z98.0, Intestinal bypass and anastomosis status                           D50.9, Iron  deficiency anemia, unspecified                           R93.3, Abnormal findings on diagnostic imaging of                            other parts of digestive tract CPT copyright 2022 [employer]. All rights reserved. The codes documented in this report are preliminary and upon coder review may  be revised to meet current compliance requirements. [name] P. [name], MD 06/02/2024 10:13:43 AM This report has been signed electronically. Number of Addenda: 0

## 2024-06-02 NOTE — Anesthesia Postprocedure Evaluation (Signed)
 Anesthesia Post Note  Patient: Ronita LITTIE Sprang  Procedure(s) Performed: COLONOSCOPY EGD (ESOPHAGOGASTRODUODENOSCOPY)     Patient location during evaluation: PACU Anesthesia Type: MAC Level of consciousness: awake and alert Pain management: pain level controlled Vital Signs Assessment: post-procedure vital signs reviewed and stable Respiratory status: spontaneous breathing, nonlabored ventilation, respiratory function stable and patient connected to nasal cannula oxygen  Cardiovascular status: stable and blood pressure returned to baseline Postop Assessment: no apparent nausea or vomiting Anesthetic complications: no   No notable events documented.  Last Vitals:  Vitals:   06/02/24 1110 06/02/24 1111  BP: (!) 150/80 (!) 149/83  Pulse: 86 85  Resp: 19 18  Temp:    SpO2: 96% 96%    Last Pain:  Vitals:   06/02/24 1111  TempSrc:   PainSc: 0-No pain                 Cordella P Rodrigo Mcgranahan

## 2024-06-03 ENCOUNTER — Ambulatory Visit: Payer: Self-pay | Admitting: Gastroenterology

## 2024-06-03 ENCOUNTER — Other Ambulatory Visit (HOSPITAL_COMMUNITY): Payer: Self-pay

## 2024-06-03 DIAGNOSIS — K56600 Partial intestinal obstruction, unspecified as to cause: Secondary | ICD-10-CM | POA: Diagnosis not present

## 2024-06-03 LAB — SURGICAL PATHOLOGY

## 2024-06-03 MED ORDER — PANTOPRAZOLE SODIUM 40 MG PO TBEC
40.0000 mg | DELAYED_RELEASE_TABLET | Freq: Every day | ORAL | 0 refills | Status: DC
  Filled 2024-06-03 (×2): qty 7, 7d supply, fill #0

## 2024-06-03 MED ORDER — TRAZODONE HCL 50 MG PO TABS: 50.0000 mg | ORAL_TABLET | Freq: Every evening | ORAL | Status: DC | PRN

## 2024-06-03 MED ORDER — FOLIC ACID 1 MG PO TABS
1.0000 mg | ORAL_TABLET | Freq: Every day | ORAL | 0 refills | Status: DC
  Filled 2024-06-03 (×2): qty 30, 30d supply, fill #0

## 2024-06-03 NOTE — TOC Transition Note (Signed)
 Transition of Care [facility]) - Discharge Note   Patient Details  Name: [name] MRN: [id number] Date of Birth: [birth year]  [facility] CM/SW Contact:  [name], RN Phone Number: 06/03/2024, 12:17 PM   Clinical Narrative:    CM spoke with the attending physician and the patient is stable to discharge and return home today.  MD called and spoke with the patient's daughter, [name] Dargan and she asked that transportation assistance be provided to home by taxi.  Patient's daughter has applied and is waiting on guardianship hearing to obtain guardianship of the patient.  The daughter states that she assists with the patient's discharge to home.  Patient is a current smoker and history of substance use.  Resources provided in the AVS.  Discharge lounge RN will assist with arranging taxi to home.  The daughter confirmed that patient's address is correct in the computer for home listing.         Patient Goals and CMS Choice            Discharge Placement                       Discharge Plan and Services Additional resources added to the After Visit Summary for                                       Social Drivers of Health (SDOH) Interventions SDOH Screenings   Food Insecurity: Food Insecurity Present (05/28/2024)  Housing: High Risk (05/28/2024)  Transportation Needs: Unmet Transportation Needs (05/28/2024)  Utilities: Not At Risk (05/28/2024)  Alcohol  Screen: High Risk (02/14/2024)  Depression (PHQ2-9): High Risk (02/05/2024)  Financial Resource Strain: Low Risk (02/05/2024)  Physical Activity: Inactive (02/05/2024)  Social Connections: Socially Isolated (02/05/2024)  Stress: Stress Concern Present (02/05/2024)  Tobacco Use: High Risk (06/02/2024)     Readmission Risk Interventions    05/31/2024    2:28 PM 12/02/2023   12:47 PM 11/05/2023    1:34 PM  Readmission Risk Prevention Plan  Transportation Screening Complete  Complete Complete  Medication Review (RN Care Manager) Referral to Pharmacy Referral to Pharmacy Referral to Pharmacy  PCP or Specialist appointment within 3-5 days of discharge Complete Complete Complete  HRI or Home Care Consult Complete Complete Complete  SW Recovery Care/Counseling Consult Complete Complete Complete  Palliative Care Screening Not Applicable Not Applicable Not Applicable  Skilled Nursing Facility Not Applicable Not Applicable Not Applicable

## 2024-06-03 NOTE — Discharge Summary (Addendum)
 Physician Discharge Summary   Patient: [name] MRN: [id number] DOB: 1965-11-24  Admit date:     05/28/2024  Discharge date: 06/03/2024  Discharge Physician: Deliliah Room   PCP: [name], NP   Recommendations at discharge:    F/u with your PCP In one week and psychiatry in 2-4 weeks. Continue taking meds as prescribed  Discharge Diagnoses: Principal Problem:   Partial bowel obstruction (HCC) Active Problems:   Abdominal pain   COPD with acute exacerbation (HCC)   Chronic respiratory failure with hypoxia (HCC)   Constipation   Low back pain   Thoracic compression fracture (HCC)   GERD with esophagitis   Hiatal hernia   Thrombocytopenia   Seizure disorder (HCC)   Schizoaffective disorder (HCC)   Iron  deficiency anemia   Tobacco abuse   Alcohol  abuse   Cocaine abuse (HCC)   Colon stricture (HCC)   Abnormal CT scan, gastrointestinal tract   H/O gastric bypass   Altered mental status   Benign neoplasm of colon    Hospital Course:  58 y.o. female with medical history significant of paroxysmal atrial fibrillation, COPD, diabetes mellitus type 2, seizures, anxiety, depression, and polysubstance abuse presents with lower back pain radiating to the right leg and abdominal pain.   Concern for partial small bowel obstruction/severe constipation Presented with abdominal pain, nausea, vomiting.   CT scan showing diffuse stool burden.  GI/surgery consulted.   and vomiting resolved.   S/p EGD-esophageal gastric landmarks identified, 1 cm hiatal hernia, Roux-en-Y gastrojejunostomy with gastrojejunal anastomosis, bypass state could be associated with an iron  deficiency, but no concerning pathology on EGD. S/p colonoscopy -3 to 4 mm polyps x 4 in transverse colon removed, tortuous colon, internal hemorrhoids but no cause for iron  deficiency on colonoscopy noted. GI advised IV iron  therapy, pharmacy suggested IV sucrose as she has allergy to IV dextran.  IV  sucrose 300 mg given.    Iron  deficiency anemia (POA) Likely in the setting of gastric bypass.  Received IV Iron    Chronic hypoxic respiratory failure/COPD- Baseline O2 3-4 L nasal cannula mostly at nights. Appears to be at baseline.  Nebulizers as needed.   Acute metabolic encephalopathy/schizophrenia/bipolar substance abuse Possibly exacerbated by alcohol  withdrawal.  Patient has multiple episodes of leaving AMA.  Known schizoaffective disorder, bipolar, schizophrenia, substance-induced mood disorder/psychosis, alcohol  use disorder severe, polysubstance abuse (I.e., though largely crack cocaine).   Has been IVC in the past due to lack of decision making capacity.  Daughter currently in process of obtaining guardianship.   Continue with depakote  and Keppra  Outpatient follow up with Psychiatry   Alcohol  abuse Continue with thiamine  and folate supplementation. Counseled extensively regarding alcohol  cessation   Cocaine abuse Counseled on cessation.   Seizure disorder Continue Keppra , Depakote    GERD with esophagitis/hiatal hernia Continue PPI, Carafate .   Thoracic compression fracture (POA) CT revealed unchanged compression fracture at T9.  Denies any pain at this time.  She is able to ambulate well.   Disposition-Home with daughter.  Pending guardianship.      Consultants: GI, Surgery Procedures performed: EGD, Colonoscopy  Disposition: Home Diet recommendation:  Carb modified diet DISCHARGE MEDICATION: Allergies as of 06/03/2024       Reactions   Iron  Dextran Shortness Of Breath, Anxiety   Aspirin  Nausea And Vomiting, Other (See Comments)   Ok to take tylenol  or ibuprofen    Penicillins Other (See Comments)   Unknown reaction from childhood: family would like this to remain as an allergy. Tolerated cephalosporins  and zosyn         Medication List     STOP taking these medications    ibuprofen  600 MG tablet Commonly known as: ADVIL    methylPREDNISolone  4  MG Tbpk tablet Commonly known as: MEDROL  DOSEPAK   sucralfate  1 g tablet Commonly known as: Carafate        TAKE these medications    albuterol  108 (90 Base) MCG/ACT inhaler Commonly known as: Ventolin  HFA Inhale 2 puffs into the lungs every 4 (four) hours as needed for wheezing or shortness of breath.   divalproex  250 MG 24 hr tablet Commonly known as: Depakote  ER Take 1 tablet (250 mg total) by mouth daily.   folic acid  1 MG tablet Commonly known as: FOLVITE  Take 1 tablet (1 mg total) by mouth daily. Start taking on: June 04, 2024   levETIRAcetam  250 MG tablet Commonly known as: Keppra  Take 1 tablet (250 mg total) by mouth 2 (two) times daily.   metFORMIN  500 MG tablet Commonly known as: GLUCOPHAGE  Take 500 mg by mouth daily.   metFORMIN  750 MG 24 hr tablet Commonly known as: GLUCOPHAGE -XR Take 1 tablet (750 mg total) by mouth daily with breakfast.   ondansetron  4 MG disintegrating tablet Commonly known as: ZOFRAN -ODT Dissolve 1 tablet (4 mg total) by mouth every 8 (eight) hours as needed for nausea or vomiting.   oxyCODONE -acetaminophen  5-325 MG tablet Commonly known as: PERCOCET/ROXICET Take 1 tablet by mouth every 4 (four) hours as needed for severe pain (pain score 7-10).   OXYGEN  Inhale 4-5 L into the lungs See admin instructions. Use every night. May use throughout the day if needed.   pantoprazole  40 MG tablet Commonly known as: Protonix  Take 1 tablet (40 mg total) by mouth daily.   thiamine  100 MG tablet Commonly known as: VITAMIN B1 Take 1 tablet (100 mg total) by mouth daily.   traZODone  50 MG tablet Commonly known as: DESYREL  Take 1 tablet (50 mg total) by mouth at bedtime as needed for sleep. What changed:  when to take this reasons to take this        Follow-up Information     Medina-Vargas, Monina C, NP. Schedule an appointment as soon as possible for a visit in 1 week(s).   Specialty: Internal Medicine Contact information: [address] [place] [address] [phone]                Discharge Exam: Fredricka Weights   06/02/24 0837  Weight: 56.7 kg   Constitutional: NAD, calm, comfortable Eyes: PERRL, lids and conjunctivae normal ENMT: Mucous membranes are moist. Posterior pharynx clear of any exudate or lesions.Normal dentition.  Neck: normal, supple, no masses, no thyromegaly Respiratory: clear to auscultation bilaterally, no wheezing, no crackles. Normal respiratory effort. No accessory muscle use.  Cardiovascular: Regular rate and rhythm, no murmurs / rubs / gallops. No extremity edema. 2+ pedal pulses. No carotid bruits.  Abdomen: no tenderness, no masses palpated. No hepatosplenomegaly. Bowel sounds positive.  Musculoskeletal: no clubbing / cyanosis. No joint deformity upper and lower extremities. Good ROM, no contractures. Normal muscle tone.  Skin: no rashes, lesions, ulcers. No induration Neurologic: CN 2-12 grossly intact. Sensation intact, DTR normal. Strength 5/5 x all 4 extremities.    Condition at discharge: good  The results of significant diagnostics from this hospitalization (including imaging, microbiology, ancillary and laboratory) are listed below for reference.   Imaging Studies: CT ABDOMEN PELVIS W CONTRAST Result Date: 05/28/2024 EXAM: CT ABDOMEN AND PELVIS WITH CONTRAST  05/28/2024 08:48:03 AM TECHNIQUE: CT of the abdomen and pelvis was performed with the administration of 75 mL of iohexol  (OMNIPAQUE ) 350 MG/ML injection. Multiplanar reformatted images are provided for review. Automated exposure control, iterative reconstruction, and/or weight-based adjustment of the mA/kV was utilized to reduce the radiation dose to as low as reasonably achievable. COMPARISON: 05/24/24 CLINICAL HISTORY: Abdominal pain, acute, nonlocalized; worsening xray. FINDINGS: LOWER CHEST: Areas of subsegmental atelectasis are present within the lingula and in both lower lobes. A patchy area of ground-glass  attenuation is noted within the medial left upper lobe. Coronary artery calcifications are present. LIVER: An unchanged, too small to characterize 4 mm hypodensity is noted in the right lobe of the liver. GALLBLADDER AND BILE DUCTS: Gallbladder is unremarkable. Fusiform dilatation of the common bile duct measures up to 8 mm, previously 6 mm. No calcified stone is identified within the common bile duct. SPLEEN: The spleen is within normal limits in size and appearance. PANCREAS: The pancreas is normal in size and contour, without focal lesion or ductal dilatation. ADRENAL GLANDS: No periadrenal stranding is present. No nodule, thickening, or hemorrhage is seen. KIDNEYS, URETERS AND BLADDER: The kidneys are normal in size and morphology bilaterally. No stones in the kidneys or ureters. No hydronephrosis. No perinephric or periureteral stranding. The urinary bladder is obscured by streak artifact from the patient's right hip arthroplasty device. GI AND BOWEL: Circumferential wall thickening of the distal esophagus with a small to moderate hiatal hernia is noted. Postoperative changes from previous gastric bypass surgery are present. No pathologic dilatation of the small bowel loops or signs of bowel inflammation are seen. Moderate colonic stool burden is noted involving the ascending colon. There is progressive gaseous distention of the redundant colon to the level of the descending colon with additional gaseous distention of the sigmoid colon. The distal sigmoid colon and rectum are decreased in caliber. A focal short segment of luminal narrowing at the junction between the descending colon and sigmoid colon is noted in the left hemiabdomen, on coronal image 65/6. There are nondilated loops of small bowel noted lateral to the dilated descending colon, which may reflect an underlying internal hernia. PERITONEUM AND RETROPERITONEUM: No free fluid or fluid collections are identified. No free air. VASCULATURE: Aorta is  normal in caliber. Aortic atherosclerosis and aortic atherosclerotic calcifications are present. The mesenteric vasculature is patent. LYMPH NODES: No signs of abdominopelvic adenopathy are seen. REPRODUCTIVE ORGANS: The uterus is not visualized and may be surgically absent. No signs of adnexal mass are seen. BONES AND SOFT TISSUES: Previous right hip arthroplasty is noted. An unchanged compression fracture involves the T9 vertebra. A subacute fracture involving the superior endplate of T9 is again seen with mild loss of the superior endplate height. Approximately 4 mm retropulsion of the superior posterior endplate is noted at this level. No new compression fractures are identified. No focal soft tissue abnormality. IMPRESSION: 1. Focal short segment luminal narrowing at the junction between the descending colon and sigmoid colon with associated gaseous distention of the redundant colon and sigmoid colon, and decreased caliber of the distal sigmoid colon and rectum. Nondilated small bowel loops lateral to the dilated descending colon may reflect an underlying internal hernia possibly resulting in partial obstruction of the left colon at the junction between the descending colon and sigmoid colon. 2. Fusiform dilatation of the common bile duct now measuring up to 8 mm, previously 6 mm. No calcified stone is identified. 3. Circumferential wall thickening of the distal esophagus with a  small to moderate hiatal hernia. Electronically signed by: Waddell Calk MD 05/28/2024 09:27 AM EST RP Workstation: [id number]   DG Lumbar Spine Complete Result Date: 05/28/2024 EXAM: 4 VIEW(S) XRAY OF THE LUMBAR SPINE 05/28/2024 06:07:44 AM COMPARISON: Lumbar spine MRI 05/25/2024. CLINICAL HISTORY: fall? FINDINGS: LUMBAR SPINE: BONES: Stable appearance of the subacute fracture deformity involving the superior endplate of the L1 vertebra with less than 25 percent vertebral body height loss. No new fractures identified. The remaining  lumbar vertebral body heights are well preserved. Alignment is normal. DISCS AND DEGENERATIVE CHANGES: The lumbar disc spaces are well preserved. No severe degenerative changes. SOFT TISSUES: Diffuse gaseous distention of the colon with moderate right-sided colonic stool burden. Signs of previous right total hip arthroplasty. No acute abnormality. IMPRESSION: 1. Stable subacute fracture deformity involving the superior endplate of the L1 vertebra with less than 25 percent vertebral body height loss. No new fractures identified. Electronically signed by: Waddell Calk MD 05/28/2024 06:18 AM EST RP Workstation: [id number]   DG Abdomen Acute W/Chest Result Date: 05/28/2024 EXAM: UPRIGHT AND SUPINE XRAY VIEWS OF THE ABDOMEN AND 4 VIEW(S) OF THE CHEST 05/28/2024 06:07:44 AM COMPARISON: 05/25/2024 CLINICAL HISTORY: sob, constipation FINDINGS: LUNGS AND PLEURA: Mild elevation of the left hemidiaphragm with mild bibasilar atelectasis. No pleural effusion or pneumothorax. HEART AND MEDIASTINUM: Overlying cardiac leads. No acute abnormality of the cardiac and mediastinal silhouettes. BOWEL: Diffuse gaseous distention of the colon with moderate right-sided colonic stool burden. PERITONEUM AND SOFT TISSUES: No abnormal calcifications. No free air. BONES: Right total hip arthroplasty. No acute fracture. IMPRESSION: 1. Diffuse gaseous distention of the colon with moderate right-sided colonic stool burden. 2. Mild bibasilar atelectasis with mild elevation of the left hemidiaphragm. Electronically signed by: Waddell Calk MD 05/28/2024 06:16 AM EST RP Workstation: [id number]   DG Knee Complete 4 Views Right Result Date: 05/25/2024 CLINICAL DATA:  Fall. EXAM: RIGHT KNEE - COMPLETE 4+ VIEW COMPARISON:  None Available. FINDINGS: No evidence of fracture, dislocation, or joint effusion. There is moderate tricompartmental osteoarthrosis with joint space narrowing and osteophyte formation. Soft tissues are unremarkable. IMPRESSION:  1. No acute fracture or dislocation. 2. Moderate tricompartmental osteoarthrosis. Electronically Signed   By: Greig Pique M.D.   On: 05/25/2024 22:36   DG Chest 2 View Result Date: 05/25/2024 CLINICAL DATA:  Chest pain after fall EXAM: DG CHEST 2V COMPARISON:  Chest x-ray 05/21/2024 FINDINGS: The heart size and mediastinal contours are within normal limits. Both lungs are clear. The visualized skeletal structures are unremarkable. IMPRESSION: No active cardiopulmonary disease. Electronically Signed   By: Greig Pique M.D.   On: 05/25/2024 22:35   CT Cervical Spine Wo Contrast Result Date: 05/25/2024 CLINICAL DATA:  Status post trauma. EXAM: CT CERVICAL SPINE WITHOUT CONTRAST TECHNIQUE: Multidetector CT imaging of the cervical spine was performed without intravenous contrast. Multiplanar CT image reconstructions were also generated. RADIATION DOSE REDUCTION: This exam was performed according to the departmental dose-optimization program which includes automated exposure control, adjustment of the mA and/or kV according to patient size and/or use of iterative reconstruction technique. COMPARISON:  Oct 29, 2023 FINDINGS: Alignment: There is straightening of the normal cervical spine lordosis. Skull base and vertebrae: No acute fracture. No primary bone lesion or focal pathologic process. Soft tissues and spinal canal: No prevertebral fluid or swelling. No visible canal hematoma. Disc levels: Mild endplate sclerosis, anterior osteophyte formation and posterior bony spurring are seen at the levels of C3-C4, C4-C5 and C5-C6. Mild multilevel intervertebral disc space narrowing is seen throughout  all levels of the cervical spine, slightly more prominent at the level of C2-C3. Mild, bilateral multilevel facet joint hypertrophy is noted. Upper chest: Negative. Other: None. IMPRESSION: 1. No acute fracture or subluxation in the cervical spine. 2. Mild multilevel degenerative changes. Electronically Signed   By:  Suzen Dials M.D.   On: 05/25/2024 22:04   CT Head Wo Contrast Result Date: 05/25/2024 CLINICAL DATA:  Status post fall EXAM: CT HEAD WITHOUT CONTRAST TECHNIQUE: Contiguous axial images were obtained from the base of the skull through the vertex without intravenous contrast. RADIATION DOSE REDUCTION: This exam was performed according to the departmental dose-optimization program which includes automated exposure control, adjustment of the mA and/or kV according to patient size and/or use of iterative reconstruction technique. COMPARISON:  None Available. FINDINGS: Brain: No evidence of acute infarction, hemorrhage, hydrocephalus, extra-axial collection or mass lesion/mass effect. Vascular: Mild bilateral cavernous carotid artery calcification is noted. Skull: Normal. Negative for fracture or focal lesion. Sinuses/Orbits: No acute finding. Other: None. IMPRESSION: No acute intracranial abnormality. Electronically Signed   By: Suzen Dials M.D.   On: 05/25/2024 22:01   MR LUMBAR SPINE WO CONTRAST Result Date: 05/25/2024 EXAM: MRI LUMBAR SPINE 05/25/2024 07:55:00 PM TECHNIQUE: Multiplanar multisequence MRI of the lumbar spine was performed without the administration of intravenous contrast. COMPARISON: CT abdomen and pelvis from 06/13/2024, lumbar spine radiograph 01/2024. CLINICAL HISTORY: Recent lumbar fracture with new numbness to right leg and falls. FINDINGS: BONES AND ALIGNMENT: Normal alignment except for findings at L1. There is a recent likely subacute wedge compression fracture of L1 with less than 25% height loss and approximately 2 mm of retropulsion of the posterior superior corner. Mild bone marrow edema is present at L1. Normal vertebral body heights otherwise. Bone marrow signal is unremarkable otherwise. SPINAL CORD: The conus terminates normally. SOFT TISSUES: No paraspinal mass. L1-L2: No significant disc herniation. No spinal canal stenosis or neural foraminal narrowing. L2-L3: No  significant disc herniation. No spinal canal stenosis or neural foraminal narrowing. L3-L4: No significant disc herniation. No spinal canal stenosis or neural foraminal narrowing. L4-L5: Small disc bulge and moderate facet hypertrophy. No central spinal canal or neural foraminal stenosis. L5-S1: No significant disc herniation. Mild facet hypertrophy. No central spinal canal or neural foraminal stenosis. IMPRESSION: 1. Recent, likely subacute wedge compression fracture of L1 with less than 25% height loss, approximately 2 mm of retropulsion of the posterior superior corner, and mild bone marrow edema. Electronically signed by: Franky Stanford MD 05/25/2024 08:56 PM EST RP Workstation: [id number]   CT ABDOMEN PELVIS W CONTRAST Result Date: 05/24/2024 EXAM: CT ABDOMEN AND PELVIS WITH CONTRAST 05/24/2024 08:42:08 AM TECHNIQUE: CT of the abdomen and pelvis was performed with the administration of 80 mL of iohexol  (OMNIPAQUE ) 300 MG/ML solution. Multiplanar reformatted images are provided for review. Automated exposure control, iterative reconstruction, and/or weight-based adjustment of the mA/kV was utilized to reduce the radiation dose to as low as reasonably achievable. COMPARISON: Recent CT abdomen and pelvis 05/21/2024, and earlier. CLINICAL HISTORY: 58 year old female. Abdominal pain, acute, nonlocalized; Abdominal/flank pain, stone suspected. FINDINGS: LOWER CHEST: Mild lung base atelectasis, overall lung base ventilation has not significantly changed. LIVER: The liver is unremarkable. GALLBLADDER AND BILE DUCTS: Gallbladder is unremarkable. No biliary ductal dilatation. SPLEEN: No acute abnormality. PANCREAS: No acute abnormality. ADRENAL GLANDS: No acute abnormality. KIDNEYS, URETERS AND BLADDER: No stones in the kidneys or ureters. No hydronephrosis. No perinephric or periureteral stranding. Urinary bladder is unremarkable. GI AND BOWEL: Small chronic gastric  hiatal hernia and Roux-en-Y type gastric bypass  changes are stable. The mid transverse colon is more gas distended today, but otherwise stable bowel loops, no evidence of mechanical obstruction. No free air, free fluid, or mesenteric inflammation identified. PERITONEUM AND RETROPERITONEUM: No ascites. No free air. No free fluid. No mesenteric inflammation. VASCULATURE: Abdominal aortic atherosclerosis is moderate. Major arterial and portal venous structures in the abdomen and pelvis appear patent. Aorta is normal in caliber. LYMPH NODES: No lymphadenopathy. REPRODUCTIVE ORGANS: Streak artifact again limits visualization of the deep pelvis. 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 Acute or subacute mild L1 compression fracture. L1 superior endplate compression fracture is new. Mild superior endplate deformity. L1 now measures 19.3 mm versus 22.8 mm normally. This is a 15% loss of vertebral body height. Subtle retropulsion of the posterior superior endplate on series 9 image 94. No significant spinal stenosis or complicating features. 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 No other acute osseous abnormality identified. IMPRESSION: 1. Acute mild L1 compression fracture with 15% loss of height. No significant retropulsion or other complicating features. 2. No other acute or inflammatory process identified in the abdomen or pelvis. 3. Stable roux-en-y type gastric bypass. Calcified aortic atherosclerosis. Electronically signed by: Helayne Hurst MD 05/24/2024 08:52 AM EST RP Workstation: [id number]   CT ABDOMEN PELVIS W CONTRAST Result Date: 05/21/2024 CLINICAL DATA:  Upper abdominal pain for 3 days with nausea and vomiting for 2 days. EXAM: CT ABDOMEN AND PELVIS WITH CONTRAST TECHNIQUE: Multidetector CT imaging of the abdomen and pelvis was performed using the standard protocol  following bolus administration of intravenous contrast. RADIATION DOSE REDUCTION: This exam was performed according to the departmental dose-optimization program which includes automated exposure control, adjustment of the mA and/or kV according to patient size and/or use of iterative reconstruction technique. CONTRAST:  75mL OMNIPAQUE  IOHEXOL  350 MG/ML SOLN COMPARISON:  03/29/2024 and older exams. FINDINGS: Lower chest: Clear lung bases. Hepatobiliary: No focal liver abnormality is seen. No gallstones, gallbladder wall thickening, or biliary dilatation. Pancreas: Unremarkable. No pancreatic ductal dilatation or surrounding inflammatory changes. Spleen: Normal in size without focal abnormality. Adrenals/Urinary Tract: Normal adrenal glands. Normal kidneys and ureters. Bladder decompressed. Stomach/Bowel: Previous gastric bypass surgery. No evidence of bowel obstruction. No stomach or bowel wall thickening or inflammation. Vascular/Lymphatic: Aortic atherosclerosis. No aneurysm. No enlarged lymph nodes. Reproductive: Status post hysterectomy. No adnexal masses. Other: No hernia.  No ascites. Musculoskeletal: Chronic compression fracture of T9. No acute fracture. No bone lesion. 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 IMPRESSION: 1. No acute findings. 2. Previous Roux-en-Y gastric bypass, without evidence of bowel obstruction or inflammation and unchanged from the prior CT. 3. Aortic atherosclerosis. Electronically Signed   By: Alm Parkins M.D.   On: 05/21/2024 11:06   DG Chest 1 View Result Date: 05/21/2024 CLINICAL DATA:  Cough.  Shortness of breath. EXAM: CHEST  1 VIEW COMPARISON:  02/03/2024 FINDINGS: The heart size and mediastinal contours are within normal limits. Both lungs are clear. The visualized skeletal structures are unremarkable. IMPRESSION: No active disease. Electronically Signed   By: Norleen DELENA Kil M.D.   On: 05/21/2024 09:42    Microbiology: Results for orders  placed or performed during the hospital encounter of 02/13/24  SARS Coronavirus 2 by RT PCR (hospital order, performed in [facility] hospital lab) *cepheid single result test* Anterior Nasal  Swab     Status: None   Collection Time: 02/14/24  9:25 AM   Specimen: Anterior Nasal Swab  Result Value Ref Range Status   SARS Coronavirus 2 by RT PCR NEGATIVE NEGATIVE Final    Comment: (NOTE) SARS-CoV-2 target nucleic acids are NOT DETECTED.  The SARS-CoV-2 RNA is generally detectable in upper and lower respiratory specimens during the acute phase of infection. The lowest concentration of SARS-CoV-2 viral copies this assay can detect is 250 copies / mL. A negative result does not preclude SARS-CoV-2 infection and should not be used as the sole basis for treatment or other patient management decisions.  A negative result may occur with improper specimen collection / handling, submission of specimen other than nasopharyngeal swab, presence of viral mutation(s) within the areas targeted by this assay, and inadequate number of viral copies (<250 copies / mL). A negative result must be combined with clinical observations, patient history, and epidemiological information.  Fact Sheet for Patients:   roadlaptop.co.za  Fact Sheet for Healthcare Providers: http://kim-miller.com/  This test is not yet approved or  cleared by the [place]  FDA and has been authorized for detection and/or diagnosis of SARS-CoV-2 by FDA under an Emergency Use Authorization (EUA).  This EUA will remain in effect (meaning this test can be used) for the duration of the COVID-19 declaration under Section 564(b)(1) of the Act, 21 U.S.C. section 360bbb-3(b)(1), unless the authorization is terminated or revoked sooner.  Performed at [facility], [address] [place], [place] [address]     Labs: CBC: Recent Labs  Lab 05/28/24 [phone] 05/29/24 1058  05/30/24 0811 05/31/24 0439 06/01/24 0438  WBC 6.9 4.6 6.0 5.8 5.9  NEUTROABS 4.2  --   --   --   --   HGB 9.1* 8.5* 9.2* 8.4* 9.7*  HCT 30.6* 28.9* 31.4* 28.3* 33.2*  MCV 76.7* 78.5* 77.9* 77.7* 77.0*  PLT 141* 160 189 195 218   Basic Metabolic Panel: Recent Labs  Lab 05/28/24 0446 05/29/24 1058 05/30/24 0811 05/31/24 0439 06/01/24 0438  NA 135 134* 143 141 138  K 4.0 3.9 4.0 4.2 4.4  CL 98 96* 101 100 100  CO2 24 27 36* 27 27  GLUCOSE 162* 209* 102* 105* 96  BUN <5* <5* <5* <5* <5*  CREATININE 0.55 0.61 0.57 0.55 0.90  CALCIUM  8.4* 7.8* 8.3* 8.2* 8.9  MG  --   --  2.1 1.9  --    Liver Function Tests: Recent Labs  Lab 05/28/24 0446 05/29/24 1058  AST 25 23  ALT 23 18  ALKPHOS 56 53  BILITOT 0.5 0.8  PROT 6.4* 5.9*  ALBUMIN  3.1* 2.8*   CBG: No results for input(s): GLUCAP in the last 168 hours.  Discharge time spent: 40 minutes.  Signed: Deliliah Room, MD [facility] 06/03/2024

## 2024-06-03 NOTE — Plan of Care (Signed)
  Problem: Education: Goal: Knowledge of General Education information will improve Description Including pain rating scale, medication(s)/side effects and non-pharmacologic comfort measures Outcome: Progressing   Problem: Health Behavior/Discharge Planning: Goal: Ability to manage health-related needs will improve Outcome: Progressing   Problem: Clinical Measurements: Goal: Ability to maintain clinical measurements within normal limits will improve Outcome: Progressing Goal: Will remain free from infection Outcome: Progressing Goal: Diagnostic test results will improve Outcome: Progressing Goal: Respiratory complications will improve Outcome: Progressing Goal: Cardiovascular complication will be avoided Outcome: Progressing   Problem: Activity: Goal: Risk for activity intolerance will decrease Outcome: Progressing   Problem: Nutrition: Goal: Adequate nutrition will be maintained Outcome: Progressing   Problem: Elimination: Goal: Will not experience complications related to bowel motility Outcome: Progressing

## 2024-06-03 NOTE — Plan of Care (Signed)

## 2024-06-05 ENCOUNTER — Encounter (HOSPITAL_COMMUNITY): Payer: Self-pay | Admitting: Gastroenterology

## 2024-06-06 ENCOUNTER — Telehealth: Payer: Self-pay

## 2024-06-06 NOTE — Transitions of Care (Post Inpatient/ED Visit) (Signed)
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 Name, DOB  Transition Care Management Follow-up Telephone Call Date of Discharge: 06/03/24 Discharge Facility: [facility]) Type of Discharge: Inpatient Admission Primary Inpatient Discharge Diagnosis:: Partial bowel obstruction  Shona Prow RN, CCM [place]  VBCI-Population Health RN Care Manager [phone]

## 2024-06-08 ENCOUNTER — Observation Stay (HOSPITAL_COMMUNITY)
Admission: EM | Admit: 2024-06-08 | Discharge: 2024-06-10 | Disposition: A | Attending: Emergency Medicine | Admitting: Emergency Medicine

## 2024-06-08 ENCOUNTER — Emergency Department (HOSPITAL_COMMUNITY)

## 2024-06-08 ENCOUNTER — Encounter (HOSPITAL_COMMUNITY): Payer: Self-pay | Admitting: Emergency Medicine

## 2024-06-08 ENCOUNTER — Other Ambulatory Visit: Payer: Self-pay

## 2024-06-08 DIAGNOSIS — F1920 Other psychoactive substance dependence, uncomplicated: Secondary | ICD-10-CM | POA: Diagnosis not present

## 2024-06-08 DIAGNOSIS — S22070G Wedge compression fracture of T9-T10 vertebra, subsequent encounter for fracture with delayed healing: Secondary | ICD-10-CM | POA: Diagnosis not present

## 2024-06-08 DIAGNOSIS — J449 Chronic obstructive pulmonary disease, unspecified: Secondary | ICD-10-CM | POA: Diagnosis not present

## 2024-06-08 DIAGNOSIS — G40909 Epilepsy, unspecified, not intractable, without status epilepticus: Secondary | ICD-10-CM | POA: Insufficient documentation

## 2024-06-08 DIAGNOSIS — R112 Nausea with vomiting, unspecified: Secondary | ICD-10-CM | POA: Diagnosis not present

## 2024-06-08 DIAGNOSIS — K2100 Gastro-esophageal reflux disease with esophagitis, without bleeding: Secondary | ICD-10-CM | POA: Diagnosis not present

## 2024-06-08 DIAGNOSIS — F259 Schizoaffective disorder, unspecified: Secondary | ICD-10-CM | POA: Diagnosis not present

## 2024-06-08 DIAGNOSIS — F17210 Nicotine dependence, cigarettes, uncomplicated: Secondary | ICD-10-CM | POA: Diagnosis not present

## 2024-06-08 DIAGNOSIS — E860 Dehydration: Secondary | ICD-10-CM | POA: Diagnosis present

## 2024-06-08 DIAGNOSIS — E43 Unspecified severe protein-calorie malnutrition: Secondary | ICD-10-CM | POA: Diagnosis present

## 2024-06-08 DIAGNOSIS — J9611 Chronic respiratory failure with hypoxia: Secondary | ICD-10-CM | POA: Diagnosis present

## 2024-06-08 DIAGNOSIS — I10 Essential (primary) hypertension: Secondary | ICD-10-CM | POA: Insufficient documentation

## 2024-06-08 DIAGNOSIS — R109 Unspecified abdominal pain: Secondary | ICD-10-CM | POA: Diagnosis present

## 2024-06-08 DIAGNOSIS — F1020 Alcohol dependence, uncomplicated: Secondary | ICD-10-CM | POA: Diagnosis present

## 2024-06-08 DIAGNOSIS — Z720 Tobacco use: Secondary | ICD-10-CM | POA: Diagnosis present

## 2024-06-08 DIAGNOSIS — E119 Type 2 diabetes mellitus without complications: Secondary | ICD-10-CM | POA: Insufficient documentation

## 2024-06-08 DIAGNOSIS — D500 Iron deficiency anemia secondary to blood loss (chronic): Secondary | ICD-10-CM

## 2024-06-08 DIAGNOSIS — E039 Hypothyroidism, unspecified: Secondary | ICD-10-CM | POA: Diagnosis not present

## 2024-06-08 DIAGNOSIS — F1010 Alcohol abuse, uncomplicated: Secondary | ICD-10-CM | POA: Diagnosis present

## 2024-06-08 DIAGNOSIS — F1290 Cannabis use, unspecified, uncomplicated: Secondary | ICD-10-CM | POA: Insufficient documentation

## 2024-06-08 DIAGNOSIS — S22000A Wedge compression fracture of unspecified thoracic vertebra, initial encounter for closed fracture: Secondary | ICD-10-CM | POA: Diagnosis not present

## 2024-06-08 DIAGNOSIS — X58XXXA Exposure to other specified factors, initial encounter: Secondary | ICD-10-CM | POA: Diagnosis not present

## 2024-06-08 DIAGNOSIS — E871 Hypo-osmolality and hyponatremia: Secondary | ICD-10-CM | POA: Diagnosis not present

## 2024-06-08 DIAGNOSIS — D509 Iron deficiency anemia, unspecified: Secondary | ICD-10-CM | POA: Diagnosis not present

## 2024-06-08 DIAGNOSIS — R1010 Upper abdominal pain, unspecified: Secondary | ICD-10-CM

## 2024-06-08 LAB — COMPREHENSIVE METABOLIC PANEL WITH GFR
ALT: 62 U/L — ABNORMAL HIGH (ref 0–44)
AST: 56 U/L — ABNORMAL HIGH (ref 15–41)
Albumin: 4.1 g/dL (ref 3.5–5.0)
Alkaline Phosphatase: 159 U/L — ABNORMAL HIGH (ref 38–126)
Anion gap: 17 — ABNORMAL HIGH (ref 5–15)
BUN: <5 mg/dL — ABNORMAL LOW (ref 6–20)
CO2: 19 mmol/L — ABNORMAL LOW (ref 22–32)
Calcium: 8.6 mg/dL — ABNORMAL LOW (ref 8.9–10.3)
Chloride: 92 mmol/L — ABNORMAL LOW (ref 98–111)
Creatinine, Ser: 0.53 mg/dL (ref 0.44–1.00)
GFR, Estimated: >60 mL/min (ref >60)
Glucose, Bld: 145 mg/dL — ABNORMAL HIGH (ref 70–99)
Potassium: 4 mmol/L (ref 3.5–5.1)
Sodium: 128 mmol/L — ABNORMAL LOW (ref 135–145)
Total Bilirubin: 0.3 mg/dL (ref 0.0–1.2)
Total Protein: 7.2 g/dL (ref 6.5–8.1)

## 2024-06-08 LAB — BASIC METABOLIC PANEL WITH GFR
Anion gap: 13 (ref 5–15)
BUN: <5 mg/dL — ABNORMAL LOW (ref 6–20)
CO2: 22 mmol/L (ref 22–32)
Calcium: 9 mg/dL (ref 8.9–10.3)
Chloride: 99 mmol/L (ref 98–111)
Creatinine, Ser: 0.54 mg/dL (ref 0.44–1.00)
GFR, Estimated: >60 mL/min (ref >60)
Glucose, Bld: 152 mg/dL — ABNORMAL HIGH (ref 70–99)
Potassium: 4.1 mmol/L (ref 3.5–5.1)
Sodium: 135 mmol/L (ref 135–145)

## 2024-06-08 LAB — URINALYSIS, ROUTINE W REFLEX MICROSCOPIC
Bilirubin Urine: NEGATIVE
Glucose, UA: 50 mg/dL — AB
Hgb urine dipstick: NEGATIVE
Ketones, ur: NEGATIVE mg/dL
Leukocytes,Ua: NEGATIVE
Nitrite: NEGATIVE
Protein, ur: NEGATIVE mg/dL
Specific Gravity, Urine: 1.024 (ref 1.005–1.030)
pH: 5 (ref 5.0–8.0)

## 2024-06-08 LAB — URINE DRUG SCREEN
Amphetamines: NEGATIVE
Barbiturates: NEGATIVE
Benzodiazepines: NEGATIVE
Cocaine: NEGATIVE
Fentanyl: NEGATIVE
Methadone Scn, Ur: NEGATIVE
Opiates: NEGATIVE
Tetrahydrocannabinol: NEGATIVE

## 2024-06-08 LAB — CBC
HCT: 32.8 % — ABNORMAL LOW (ref 36.0–46.0)
Hemoglobin: 10 g/dL — ABNORMAL LOW (ref 12.0–15.0)
MCH: 22.9 pg — ABNORMAL LOW (ref 26.0–34.0)
MCHC: 30.5 g/dL (ref 30.0–36.0)
MCV: 75.2 fL — ABNORMAL LOW (ref 80.0–100.0)
Platelets: 383 K/uL (ref 150–400)
RBC: 4.36 MIL/uL (ref 3.87–5.11)
RDW: 23.6 % — ABNORMAL HIGH (ref 11.5–15.5)
WBC: 4.4 K/uL (ref 4.0–10.5)
nRBC: 0 % (ref 0.0–0.2)

## 2024-06-08 LAB — VALPROIC ACID LEVEL: Valproic Acid Lvl: <10 ug/mL — ABNORMAL LOW (ref 50–100)

## 2024-06-08 LAB — PROCALCITONIN: Procalcitonin: 23.8 ng/mL

## 2024-06-08 LAB — CK: Total CK: 251 U/L — ABNORMAL HIGH (ref 38–234)

## 2024-06-08 LAB — ETHANOL: Alcohol, Ethyl (B): <15 mg/dL (ref <15)

## 2024-06-08 LAB — LACTIC ACID, PLASMA
Lactic Acid, Venous: 4.3 mmol/L (ref 0.5–1.9)
Lactic Acid, Venous: 2.6 mmol/L (ref 0.5–1.9)

## 2024-06-08 LAB — GLUCOSE, CAPILLARY: Glucose-Capillary: 123 mg/dL — ABNORMAL HIGH (ref 70–99)

## 2024-06-08 LAB — LIPASE, BLOOD: Lipase: 28 U/L (ref 11–51)

## 2024-06-08 LAB — CREATININE, URINE, RANDOM: Creatinine, Urine: 49 mg/dL

## 2024-06-08 LAB — MAGNESIUM: Magnesium: 1.9 mg/dL (ref 1.7–2.4)

## 2024-06-08 LAB — PROTIME-INR
INR: 0.9 (ref 0.8–1.2)
Prothrombin Time: 13 s (ref 11.4–15.2)

## 2024-06-08 LAB — SODIUM, URINE, RANDOM: Sodium, Ur: 86 mmol/L

## 2024-06-08 LAB — PHOSPHORUS: Phosphorus: 3.1 mg/dL (ref 2.5–4.6)

## 2024-06-08 LAB — CBG MONITORING, ED: Glucose-Capillary: 158 mg/dL — ABNORMAL HIGH (ref 70–99)

## 2024-06-08 MED ORDER — DIVALPROEX SODIUM ER 250 MG PO TB24
250.0000 mg | ORAL_TABLET | Freq: Every day | ORAL | Status: DC
  Administered 2024-06-09 – 2024-06-10 (×2): 250 mg via ORAL
  Filled 2024-06-08 (×2): qty 1

## 2024-06-08 MED ORDER — ONDANSETRON HCL 4 MG PO TABS: 4.0000 mg | ORAL_TABLET | Freq: Four times a day (QID) | ORAL | Status: DC | PRN

## 2024-06-08 MED ORDER — SODIUM CHLORIDE 0.9 % IV BOLUS
1000.0000 mL | Freq: Once | INTRAVENOUS | Status: AC
  Administered 2024-06-08: 17:00:00 1000 mL via INTRAVENOUS

## 2024-06-08 MED ORDER — LORAZEPAM 1 MG PO TABS
1.0000 mg | ORAL_TABLET | ORAL | Status: DC | PRN
  Administered 2024-06-08 – 2024-06-09 (×4): 1 mg via ORAL
  Administered 2024-06-09 – 2024-06-10 (×2): 2 mg via ORAL
  Administered 2024-06-10: 1 mg via ORAL
  Filled 2024-06-08 (×2): qty 2
  Filled 2024-06-08 (×2): qty 1
  Filled 2024-06-08: qty 2
  Filled 2024-06-08: qty 1
  Filled 2024-06-08: qty 2
  Filled 2024-06-08 (×2): qty 1

## 2024-06-08 MED ORDER — THIAMINE HCL 100 MG PO TABS: 100.0000 mg | ORAL_TABLET | Freq: Every day | ORAL | Status: DC

## 2024-06-08 MED ORDER — FOLIC ACID 1 MG PO TABS
1.0000 mg | ORAL_TABLET | Freq: Every day | ORAL | Status: DC
  Administered 2024-06-09 – 2024-06-10 (×2): 1 mg via ORAL
  Filled 2024-06-08 (×3): qty 1

## 2024-06-08 MED ORDER — PANTOPRAZOLE SODIUM 40 MG IV SOLR
40.0000 mg | Freq: Two times a day (BID) | INTRAVENOUS | Status: DC
  Administered 2024-06-08 – 2024-06-10 (×4): 40 mg via INTRAVENOUS
  Filled 2024-06-08 (×4): qty 10

## 2024-06-08 MED ORDER — FENTANYL CITRATE (PF) 50 MCG/ML IJ SOSY
12.5000 ug | PREFILLED_SYRINGE | INTRAMUSCULAR | Status: DC | PRN
  Administered 2024-06-08 – 2024-06-09 (×2): 50 ug via INTRAVENOUS
  Filled 2024-06-08 (×2): qty 1

## 2024-06-08 MED ORDER — LEVETIRACETAM (KEPPRA) 500 MG/5 ML ADULT IV PUSH
250.0000 mg | Freq: Two times a day (BID) | INTRAVENOUS | Status: DC
  Administered 2024-06-08 – 2024-06-10 (×4): 250 mg via INTRAVENOUS
  Filled 2024-06-08 (×4): qty 5

## 2024-06-08 MED ORDER — ACETAMINOPHEN 650 MG RE SUPP: 650.0000 mg | Freq: Four times a day (QID) | RECTAL | Status: DC | PRN

## 2024-06-08 MED ORDER — OXYCODONE HCL 5 MG PO TABS
5.0000 mg | ORAL_TABLET | ORAL | Status: DC | PRN
  Administered 2024-06-09 – 2024-06-10 (×6): 5 mg via ORAL
  Filled 2024-06-08 (×6): qty 1

## 2024-06-08 MED ORDER — DROPERIDOL 2.5 MG/ML IJ SOLN
1.2500 mg | Freq: Once | INTRAMUSCULAR | Status: AC
  Administered 2024-06-08: 17:00:00 1.25 mg via INTRAVENOUS
  Filled 2024-06-08: qty 2

## 2024-06-08 MED ORDER — SODIUM CHLORIDE 0.9 % IV SOLN: INTRAVENOUS | Status: DC

## 2024-06-08 MED ORDER — INSULIN ASPART 100 UNIT/ML IJ SOLN
0.0000 [IU] | INTRAMUSCULAR | Status: DC
  Administered 2024-06-08: 20:00:00 2 [IU] via SUBCUTANEOUS
  Administered 2024-06-08: 1 [IU] via SUBCUTANEOUS
  Administered 2024-06-09: 09:00:00 2 [IU] via SUBCUTANEOUS
  Filled 2024-06-08: qty 1
  Filled 2024-06-08 (×2): qty 2

## 2024-06-08 MED ORDER — GUAIFENESIN ER 600 MG PO TB12
600.0000 mg | ORAL_TABLET | Freq: Two times a day (BID) | ORAL | Status: DC
  Administered 2024-06-08 – 2024-06-10 (×4): 600 mg via ORAL
  Filled 2024-06-08 (×4): qty 1

## 2024-06-08 MED ORDER — NICOTINE 21 MG/24HR TD PT24
21.0000 mg | MEDICATED_PATCH | Freq: Every day | TRANSDERMAL | Status: DC
  Administered 2024-06-08 – 2024-06-10 (×3): 21 mg via TRANSDERMAL
  Filled 2024-06-08 (×4): qty 1

## 2024-06-08 MED ORDER — SODIUM CHLORIDE 0.9 % IV BOLUS
500.0000 mL | Freq: Once | INTRAVENOUS | Status: AC
  Administered 2024-06-08: 22:00:00 500 mL via INTRAVENOUS

## 2024-06-08 MED ORDER — ONDANSETRON HCL 4 MG/2ML IJ SOLN
4.0000 mg | Freq: Four times a day (QID) | INTRAMUSCULAR | Status: DC | PRN
  Filled 2024-06-08: qty 2

## 2024-06-08 MED ORDER — ALBUTEROL SULFATE (2.5 MG/3ML) 0.083% IN NEBU: 2.5000 mg | INHALATION_SOLUTION | RESPIRATORY_TRACT | Status: DC | PRN

## 2024-06-08 MED ORDER — THIAMINE MONONITRATE 100 MG PO TABS
100.0000 mg | ORAL_TABLET | Freq: Every day | ORAL | Status: DC
  Administered 2024-06-09 – 2024-06-10 (×2): 100 mg via ORAL
  Filled 2024-06-08 (×3): qty 1

## 2024-06-08 MED ORDER — SODIUM CHLORIDE 0.9 % IV SOLN: INTRAVENOUS | Status: AC

## 2024-06-08 MED ORDER — FAMOTIDINE IN NACL 20-0.9 MG/50ML-% IV SOLN
20.0000 mg | Freq: Once | INTRAVENOUS | Status: AC
  Administered 2024-06-08: 17:00:00 20 mg via INTRAVENOUS
  Filled 2024-06-08: qty 50

## 2024-06-08 MED ORDER — LORAZEPAM 2 MG/ML IJ SOLN: 1.0000 mg | INTRAMUSCULAR | Status: DC | PRN

## 2024-06-08 MED ORDER — METOCLOPRAMIDE HCL 5 MG/ML IJ SOLN
10.0000 mg | Freq: Once | INTRAMUSCULAR | Status: AC
  Administered 2024-06-08: 19:00:00 10 mg via INTRAVENOUS
  Filled 2024-06-08: qty 2

## 2024-06-08 MED ORDER — ACETAMINOPHEN 325 MG PO TABS
650.0000 mg | ORAL_TABLET | Freq: Four times a day (QID) | ORAL | Status: DC | PRN
  Administered 2024-06-09: 13:00:00 650 mg via ORAL
  Filled 2024-06-08 (×2): qty 2

## 2024-06-08 MED ORDER — FOLIC ACID 1 MG PO TABS: 1.0000 mg | ORAL_TABLET | Freq: Every day | ORAL | Status: DC

## 2024-06-08 MED ORDER — TRAZODONE HCL 50 MG PO TABS
50.0000 mg | ORAL_TABLET | Freq: Every evening | ORAL | Status: DC | PRN
  Administered 2024-06-08 – 2024-06-09 (×2): 50 mg via ORAL
  Filled 2024-06-08 (×2): qty 1

## 2024-06-08 MED ORDER — METOCLOPRAMIDE HCL 5 MG/ML IJ SOLN
5.0000 mg | Freq: Four times a day (QID) | INTRAMUSCULAR | Status: DC | PRN
  Filled 2024-06-08: qty 2

## 2024-06-08 MED ORDER — THIAMINE HCL 100 MG/ML IJ SOLN
100.0000 mg | Freq: Every day | INTRAMUSCULAR | Status: DC
  Administered 2024-06-08: 22:00:00 100 mg via INTRAVENOUS
  Filled 2024-06-08: qty 2

## 2024-06-08 MED ORDER — PANTOPRAZOLE SODIUM 40 MG PO TBEC: 40.0000 mg | DELAYED_RELEASE_TABLET | Freq: Every day | ORAL | Status: DC

## 2024-06-08 NOTE — Assessment & Plan Note (Signed)
Order CIWA protocol °

## 2024-06-08 NOTE — Assessment & Plan Note (Signed)
 -  Spoke about importance of quitting spent 5 minutes discussing options for treatment, prior attempts at quitting, and dangers of smoking ? -At this point patient is    NOT  interested in quitting ? - order nicotine patch  ? - nursing tobacco cessation protocol ? ?

## 2024-06-08 NOTE — Assessment & Plan Note (Signed)
 Anemia stable monitor transfuse as needed for hemoglobin below 7

## 2024-06-08 NOTE — Assessment & Plan Note (Addendum)
 CT abdomen nonacute continue supportive management patient with history of recurrent abdominal pain and gastritis

## 2024-06-08 NOTE — ED Provider Triage Note (Signed)
 Emergency Medicine Provider Triage Evaluation Note  [name] , a 58 y.o. female  was evaluated in triage.  Pt complains of abdominal pain.  Review of Systems  Positive:  Negative:   Physical Exam  BP (!) 135/93 (BP Location: Left Arm)   Pulse (!) 103   Temp 98.1 White (36.7 C) (Oral)   Resp 18   LMP 01/08/2011   SpO2 99%  Gen:   Awake, no distress   Resp:  Normal effort  MSK:   Moves extremities without difficulty  Other:    Medical Decision Making  Medically screening exam initiated at 10:51 AM.  Appropriate orders placed.  [name] was informed that the remainder of the evaluation will be completed by another provider, this initial triage assessment does not replace that evaluation, and the importance of remaining in the ED until their evaluation is complete.  Lower abdominal, nausea, vomiting starting this morning.  Denies fever, dysuria, hematuria, hematochezia.   [name], [place] 06/08/24 1052

## 2024-06-08 NOTE — ED Notes (Signed)
 Patient stated she felt as she was going to pass out, brought her back to triage area. reassessed vital signs. Triage RN aware and placed back outside in waiting room.

## 2024-06-08 NOTE — Subjective & Objective (Signed)
 Was brought in from home with recurrent abdominal pain patient has history of this in the past had recent admission post partial small bowel obstruction and was just discharged from hospital on 03 June 2024 Patient presents with persistent nausea and vomiting and retching Reports she is unable to keep down fluids or medications Patient was given IV fluids and given IV droperidol  for nausea but still continues to be nauseous CT abdomen pelvis nonacute at which point hospitalist was called for admission.

## 2024-06-08 NOTE — Assessment & Plan Note (Signed)
-   Order Sensitive  SSI  ? , ? -  check TSH and HgA1C ? - Hold by mouth medications  ? ? ?

## 2024-06-08 NOTE — Assessment & Plan Note (Signed)
 Continue home medications as much as patient able to tolerate

## 2024-06-08 NOTE — Assessment & Plan Note (Signed)
 Chronic stable continue to provide O2 at nighttime patient with chronic respiratory failure requiring 3 to 4 L at night secondary to COPD continue home nebulizers

## 2024-06-08 NOTE — Assessment & Plan Note (Signed)
-

## 2024-06-08 NOTE — Assessment & Plan Note (Signed)
 Chronic seen on prior imaging supportive management

## 2024-06-08 NOTE — Assessment & Plan Note (Addendum)
 Obtain Depakote  level continue Keppra  at home dose 250 twice daily changed to IV while having hard time with p.o. intake

## 2024-06-08 NOTE — ED Provider Notes (Signed)
 [facility] EMERGENCY DEPARTMENT AT [facility] Provider Note   CSN: [id number] Arrival date & time: 06/08/24  1033     Patient presents with: Abdominal Pain   [name] is a 58 y.o. female presenting Emergency Department of nausea and vomiting.  Patient has a history including paroxysmal A-fib, COPD, type 2 diabetes, anxiety, schizoaffective disorder, polysubstance use, polyps of the colon, recent hospitalization for partial bowel obstruction, presented to the ED now with persistent nausea and vomiting.  Patient is a difficult time tell me exactly when her symptoms began.  She was discharged from the hospital 5 days ago for partial bowel obstruction.  She said she cannot keep down any of her medications.   HPI     Prior to Admission medications  Medication Sig Start Date End Date Taking? Authorizing Provider  albuterol  (VENTOLIN  HFA) 108 (90 Base) MCG/ACT inhaler Inhale 2 puffs into the lungs every 4 (four) hours as needed for wheezing or shortness of breath. 05/28/24   Simon Lavonia SAILOR, MD  divalproex  (DEPAKOTE  ER) 250 MG 24 hr tablet Take 1 tablet (250 mg total) by mouth daily. 05/28/24   Simon Lavonia SAILOR, MD  folic acid  (FOLVITE ) 1 MG tablet Take 1 tablet (1 mg total) by mouth daily. 06/04/24   Rashid, Farhan, MD  levETIRAcetam  (KEPPRA ) 250 MG tablet Take 1 tablet (250 mg total) by mouth 2 (two) times daily. 05/23/24   Medina-Vargas, Monina C, NP  metFORMIN  (GLUCOPHAGE ) 500 MG tablet Take 500 mg by mouth daily.    [provider]  metFORMIN  (GLUCOPHAGE -XR) 750 MG 24 hr tablet Take 1 tablet (750 mg total) by mouth daily with breakfast. Patient not taking: Reported on 05/28/2024 05/28/24   Simon Lavonia SAILOR, MD  ondansetron  (ZOFRAN -ODT) 4 MG disintegrating tablet Dissolve 1 tablet (4 mg total) by mouth every 8 (eight) hours as needed for nausea or vomiting. 05/25/24   Tegeler, Lonni PARAS, MD  oxyCODONE -acetaminophen  (PERCOCET/ROXICET) 5-325 MG tablet Take 1 tablet by  mouth every 4 (four) hours as needed for severe pain (pain score 7-10). 05/25/24   Tegeler, Lonni PARAS, MD  OXYGEN  Inhale 4-5 L into the lungs See admin instructions. Use every night. May use throughout the day if needed.    [provider]  pantoprazole  (PROTONIX ) 40 MG tablet Take 1 tablet (40 mg total) by mouth daily. 06/03/24   Rashid, Farhan, MD  thiamine  (VITAMIN B1) 100 MG tablet Take 1 tablet (100 mg total) by mouth daily. 05/28/24   Simon Lavonia SAILOR, MD  traZODone  (DESYREL ) 50 MG tablet Take 1 tablet (50 mg total) by mouth at bedtime as needed for sleep. 06/03/24   Rashid, Farhan, MD    Allergies: Iron  dextran, Aspirin , and Penicillins    Review of Systems  Updated Vital Signs BP (!) 145/77 (BP Location: Right Arm)   Pulse 99   Temp 97.6 F (36.4 C) (Oral)   Resp 17   LMP 01/08/2011   SpO2 96%   Physical Exam Constitutional:      General: She is not in acute distress.    Comments: Dry heaving  HENT:     Head: Normocephalic and atraumatic.  Eyes:     Conjunctiva/sclera: Conjunctivae normal.     Pupils: Pupils are equal, round, and reactive to light.  Cardiovascular:     Rate and Rhythm: Normal rate and regular rhythm.  Pulmonary:     Effort: Pulmonary effort is normal. No respiratory distress.  Abdominal:     General: There  is no distension.     Tenderness: There is no abdominal tenderness.  Skin:    General: Skin is warm and dry.  Neurological:     General: No focal deficit present.     Mental Status: She is alert. Mental status is at baseline.  Psychiatric:        Mood and Affect: Mood normal.        Behavior: Behavior normal.     (all labs ordered are listed, but only abnormal results are displayed) Labs Reviewed  COMPREHENSIVE METABOLIC PANEL WITH GFR - Abnormal; Notable for the following components:      Result Value   Sodium 128 (*)    Chloride 92 (*)    CO2 19 (*)    Glucose, Bld 145 (*)    BUN <5 (*)    Calcium  8.6 (*)    AST 56 (*)     ALT 62 (*)    Alkaline Phosphatase 159 (*)    Anion gap 17 (*)    All other components within normal limits  CBC - Abnormal; Notable for the following components:   Hemoglobin 10.0 (*)    HCT 32.8 (*)    MCV 75.2 (*)    MCH 22.9 (*)    RDW 23.6 (*)    All other components within normal limits  URINALYSIS, ROUTINE W REFLEX MICROSCOPIC - Abnormal; Notable for the following components:   Glucose, UA 50 (*)    Bacteria, UA RARE (*)    All other components within normal limits  LIPASE, BLOOD  URINE DRUG SCREEN    EKG: None  Radiology: CT ABDOMEN PELVIS WO CONTRAST Result Date: 06/08/2024 CLINICAL DATA:  Abdominal pain. EXAM: CT ABDOMEN AND PELVIS WITHOUT CONTRAST TECHNIQUE: Multidetector CT imaging of the abdomen and pelvis was performed following the standard protocol without IV contrast. RADIATION DOSE REDUCTION: This exam was performed according to the departmental dose-optimization program which includes automated exposure control, adjustment of the mA and/or kV according to patient size and/or use of iterative reconstruction technique. COMPARISON:  05/28/2024. FINDINGS: Lower chest: Scattered subsegmental volume loss in the lung bases. No pleural fluid. Heart is at the upper limits of normal in size. Age advanced right coronary artery calcification. No pericardial or pleural effusion. Air and fluid in the distal esophagus. Hepatobiliary: Liver is borderline enlarged, 17.9 cm. Liver and gallbladder are otherwise unremarkable. No biliary ductal dilatation. Pancreas: Negative. Spleen: Negative. Adrenals/Urinary Tract: Adrenal glands and kidneys are unremarkable. Ureters are decompressed. Bladder is largely obscured by streak artifact from a right hip arthroplasty. Stomach/Bowel: Small hiatal hernia. Gastric bypass. There may be a mildly atonic segment at the small bowel anastomosis, deep to the umbilicus, nonspecific. Stomach, small bowel, appendix and colon are otherwise unremarkable.  Vascular/Lymphatic: Atherosclerotic calcification of the aorta. No pathologically enlarged lymph nodes. Reproductive: Hysterectomy. Right hip arthroplasty creates streak artifact in the anatomic pelvis. No definite adnexal mass. Other: No free fluid.  Mesenteries and peritoneum are unremarkable. Musculoskeletal: Right hip arthroplasty. Degenerative changes in the spine. Old T9 compression fracture. L1 compression fracture is similar to minimally progressive from 05/24/2024. IMPRESSION: 1. No acute findings. 2. L1 compression fracture is similar to minimally progressive from 05/24/2024. 3. Age advanced right coronary artery calcification. 4.  Aortic atherosclerosis (ICD10-I70.0). Electronically Signed   By: Newell Eke M.D.   On: 06/08/2024 13:47     Procedures   Medications Ordered in the ED  droperidol  (INAPSINE ) 2.5 MG/ML injection 1.25 mg (1.25 mg Intravenous Given 06/08/24 1654)  sodium chloride  0.9 % bolus 1,000 mL (0 mLs Intravenous Stopped 06/08/24 1817)  famotidine  (PEPCID ) IVPB 20 mg premix (0 mg Intravenous Stopped 06/08/24 1724)  metoCLOPramide  (REGLAN ) injection 10 mg (10 mg Intravenous Given 06/08/24 1849)    Clinical Course as of 06/08/24 1907  Wed Jun 08, 2024  1906 Admitted to hospitalist dr dominique [MT]    Clinical Course User Index [MT] Cottie Donnice PARAS, MD                                 Medical Decision Making Amount and/or Complexity of Data Reviewed Labs: ordered.  Risk Prescription drug management. Decision regarding hospitalization.   This patient presents to the ED with concern for nausea vomiting. This involves an extensive number of treatment options, and is a complaint that carries with it a high risk of complications and morbidity.  The differential diagnosis includes bowel obstruction versus gastroparesis versus gastritis versus biliary disease versus other  Co-morbidities that complicate the patient evaluation: History of cyclical vomiting,  abdominal surgery, bowel obstruction risk factor   External records from outside source obtained and reviewed including hospital discharge summary from 5 days ago  I ordered and personally interpreted labs.  The pertinent results include: Hyponatremia, creatinine within normal limits.  UA without clear evidence of infection.  White blood cell count within normal limits.  Lipase normal.  I ordered imaging studies including CT abdomen pelvis I independently visualized and interpreted imaging which showed no emergent findings I agree with the radiologist interpretation  The patient was maintained on a cardiac monitor.  I personally viewed and interpreted the cardiac monitored which showed an underlying rhythm of: Sinus tachycardia  I ordered medication including IV saline for hyponatremia, IV droperidol  for nausea,  I have reviewed the patients home medicines and have made adjustments as needed  After the interventions noted above, I reevaluated the patient and found that they have: stayed the same    Dispostion:  After consideration of the diagnostic results and the patients response to treatment, I feel that the patent would benefit from medical admission.      Final diagnoses:  Nausea and vomiting, unspecified vomiting type  Hyponatremia    ED Discharge Orders     None          Cottie Donnice PARAS, MD 06/08/24 [phone]

## 2024-06-08 NOTE — Assessment & Plan Note (Signed)
Order nutritional consult check prealbumin °

## 2024-06-08 NOTE — Assessment & Plan Note (Signed)
 Monitor for any sign of alcohol  withdrawal Monitoring progressive CIWA protocol

## 2024-06-08 NOTE — Assessment & Plan Note (Signed)
 Albuterol  PRN  Currently no wheezing

## 2024-06-08 NOTE — Assessment & Plan Note (Signed)
 Rehydrate and follow fluid status check orthostatics prior to discharge

## 2024-06-08 NOTE — ED Notes (Signed)
 US  IV was removed in chart but IV just needed to be repositioned

## 2024-06-08 NOTE — Assessment & Plan Note (Addendum)
 In the setting of dehydration nausea vomiting history of alcohol  abuse Obtain electrolytes rehydrate follow sodium levels Hyponatremia seems to have improved after IV fluid rehydration

## 2024-06-08 NOTE — H&P (Signed)
 [name] FMW:[id number] DOB: 04-Mar-1966 DOA: 06/08/2024     PCP: [name], NP   Outpatient Specialists:   CARDS:  Dr. Peter Jordan, MD    Patient arrived to ER on 06/08/24 at 1033 Referred by Attending Trifan, Donnice PARAS, MD   Patient coming from:    home Lives alone,        Chief Complaint:   Chief Complaint  Patient presents with   Abdominal Pain    HPI: [name] is a 58 y.o. 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, iron  deficiency anemia, history of alcohol , cocaine abuse,      Presented with   nausea vomiting Was brought in from home with recurrent abdominal pain patient has history of this in the past had recent admission post partial small bowel obstruction and was just discharged from hospital on 03 June 2024 Patient presents with persistent nausea and vomiting and retching Reports she is unable to keep down fluids or medications Patient was given IV fluids and given IV droperidol  for nausea but still continues to be nauseous CT abdomen pelvis nonacute at which point hospitalist was called for admission.    Patient denies any fevers or chills, states that her abdominal pain and nausea started just today. Patient states that she has not been able to take her home medications because of this. Denies any chest pain  Of note during her last admission she was seen by GI Dr. Leigh had a EGD done that showed no concerning pathology.  Colonoscopy showed polyps x 4 which were removed and internal hemorrhoids GI recommended IV iron  which was given   significant ETOH intake continues to drink 40s every day last alcoholic drink was yesterday Patient is tremulous Does   smoke  but interested in quitting    Denies cocaine use      Regarding perti     DM 2 -  Lab Results  Component Value Date   HGBA1C 7.4 (H) 02/05/2024   on  PO meds only,        COPD -    on baseline oxygen   3-4L, at night    A. Fib -   atrial fibrillation CHA2DS2 vas score    3     Not on anticoagulation secondary to Risk of Falls,   recurrent bleeding iron  deficiency anemia            Chronic iron  deficiency anemia - baseline hg Hemoglobin & Hematocrit  Recent Labs    05/31/24 0439 06/01/24 0438 06/08/24 1042  HGB 8.4* 9.7* 10.0*   Iron /TIBC/Ferritin/ %Sat    Component Value Date/Time   IRON  21 (L) 05/30/2024 0811   TIBC 428 05/30/2024 0811   FERRITIN 23 05/30/2024 0811   IRONPCTSAT 5 (L) 05/30/2024 0811     Seizure DO - currently on Depakote  and Keppra      While in ER: Clinical Course as of 06/08/24 1917  Wed Jun 08, 2024  1906 Admitted to hospitalist dr dominique [MT]    Clinical Course User Index [MT] Cottie Donnice PARAS, MD         Lab Orders         Lipase, blood         Comprehensive metabolic panel         CBC  Urinalysis, Routine w reflex microscopic -Urine, Clean Catch         Urine Drug Screen        CTabd/pelvis -  nonacute    Following Medications were ordered in ER: Medications  droperidol  (INAPSINE ) 2.5 MG/ML injection 1.25 mg (1.25 mg Intravenous Given 06/08/24 1654)  sodium chloride  0.9 % bolus 1,000 mL (0 mLs Intravenous Stopped 06/08/24 1817)  famotidine  (PEPCID ) IVPB 20 mg premix (0 mg Intravenous Stopped 06/08/24 1724)  metoCLOPramide  (REGLAN ) injection 10 mg (10 mg Intravenous Given 06/08/24 1849)    _____     ED Triage Vitals  Encounter Vitals Group     BP 06/08/24 1042 (!) 135/93     Girls Systolic BP Percentile --      Girls Diastolic BP Percentile --      Boys Systolic BP Percentile --      Boys Diastolic BP Percentile --      Pulse Rate 06/08/24 1042 (!) 103     Resp 06/08/24 1042 18     Temp 06/08/24 1042 98.1 F (36.7 C)     Temp Source  06/08/24 1042 Oral     SpO2 06/08/24 1042 99 %     Weight --      Height --      Head Circumference --      Peak Flow --      Pain Score 06/08/24 1040 10     Pain Loc --      Pain Education --      Exclude from Growth Chart --   UFJK(75)@     _________________________________________ Significant initial  Findings: Abnormal Labs Reviewed  COMPREHENSIVE METABOLIC PANEL WITH GFR - Abnormal; Notable for the following components:      Result Value   Sodium 128 (*)    Chloride 92 (*)    CO2 19 (*)    Glucose, Bld 145 (*)    BUN <5 (*)    Calcium  8.6 (*)    AST 56 (*)    ALT 62 (*)    Alkaline Phosphatase 159 (*)    Anion gap 17 (*)    All other components within normal limits  CBC - Abnormal; Notable for the following components:   Hemoglobin 10.0 (*)    HCT 32.8 (*)    MCV 75.2 (*)    MCH 22.9 (*)    RDW 23.6 (*)    All other components within normal limits  URINALYSIS, ROUTINE W REFLEX MICROSCOPIC - Abnormal; Notable for the following components:   Glucose, UA 50 (*)    Bacteria, UA RARE (*)    All other components within normal limits      Cardiac Panel (last 3 results) Recent Labs    06/08/24 1946  CKTOTAL 251*     ECG: Ordered   BNP (last 3 results) Recent Labs    12/11/23 0349 12/12/23 0427 01/23/24 1900  BNP 55.2 95.6 17.0       The recent clinical data is shown below. Vitals:   06/08/24 1042 06/08/24 1223 06/08/24 1532 06/08/24 1837  BP: (!) 135/93 (!) 158/84 (!) 166/86 (!) 145/77  Pulse: (!) 103 (!) 104 (!) 114 99  Resp: 18 (!) 22 20 17   Temp: 98.1 F (36.7 C) 98.3 F (36.8 C) 97.9 F (36.6 C) 97.6 F (36.4 C)  TempSrc: Oral Oral Axillary Oral  SpO2: 99% 99% 98% 96%     WBC     Component Value Date/Time   WBC  4.4 06/08/2024 1042   LYMPHSABS 1.9 05/28/2024 0446   MONOABS 0.6 05/28/2024 0446   EOSABS 0.1 05/28/2024 0446   BASOSABS 0.0 05/28/2024 0446          UA   no evidence of UTI    Urine analysis:    Component Value  Date/Time   COLORURINE YELLOW 06/08/2024 1116   APPEARANCEUR CLEAR 06/08/2024 1116   LABSPEC 1.024 06/08/2024 1116   PHURINE 5.0 06/08/2024 1116   GLUCOSEU 50 (A) 06/08/2024 1116   HGBUR NEGATIVE 06/08/2024 1116   BILIRUBINUR NEGATIVE 06/08/2024 1116   KETONESUR NEGATIVE 06/08/2024 1116   PROTEINUR NEGATIVE 06/08/2024 1116   UROBILINOGEN 0.2 09/04/2013 0426   NITRITE NEGATIVE 06/08/2024 1116   LEUKOCYTESUR NEGATIVE 06/08/2024 1116    __________________________________________________________ Recent Labs  Lab 06/08/24 1042 06/08/24 1946  NA 128* 135  K 4.0 4.1  CO2 19* 22  GLUCOSE 145* 152*  BUN <5* <5*  CREATININE 0.53 0.54  CALCIUM  8.6* 9.0  MG  --  1.9  PHOS  --  3.1    Cr  stable,  Up from baseline see below Lab Results  Component Value Date   CREATININE 0.53 06/08/2024   CREATININE 0.90 06/01/2024   CREATININE 0.55 05/31/2024    Recent Labs  Lab 06/08/24 1042  AST 56*  ALT 62*  ALKPHOS 159*  BILITOT 0.3  PROT 7.2  ALBUMIN  4.1   Lab Results  Component Value Date   CALCIUM  8.6 (L) 06/08/2024   PHOS 3.5 12/08/2023        Plt: Lab Results  Component Value Date   PLT 383 06/08/2024       Recent Labs  Lab 06/08/24 1042  WBC 4.4  HGB 10.0*  HCT 32.8*  MCV 75.2*  PLT 383    HG/HCT stable,       Component Value Date/Time   HGB 10.0 (L) 06/08/2024 1042   HCT 32.8 (L) 06/08/2024 1042   MCV 75.2 (L) 06/08/2024 1042     Recent Labs  Lab 06/08/24 1042  LIPASE 28   No results for input(s): AMMONIA in the last 168 hours.    _______________________________________________ Hospitalist was called for admission for   Nausea and vomiting,    The following Work up has been ordered so far:  Orders Placed This Encounter  Procedures   CT ABDOMEN PELVIS WO CONTRAST   Lipase, blood   Comprehensive metabolic panel   CBC   Urinalysis, Routine w reflex microscopic -Urine, Clean Catch   Urine Drug Screen   Diet NPO time specified   Consult to  hospitalist     OTHER Significant initial  Findings:  labs showing:     DM  labs:  HbA1C: Recent Labs    11/05/23 0430 02/05/24 0000  HGBA1C 6.5* 7.4*       CBG (last 3)  No results for input(s): GLUCAP in the last 72 hours.        Cultures:    Component Value Date/Time   SDES BRONCHIAL ALVEOLAR LAVAGE 12/02/2023 1136   SPECREQUEST NONE 12/02/2023 1136   CULT  12/02/2023 1136    RARE CANDIDA DUBLINIENSIS RESULT CALLED TO, READ BACK BY AND VERIFIED WITH: RN DENYS BARBOUR 12/05/2023 1233 BY JINNY COMMON, MT Performed at [facility], [address] [place], [place] [address]    REPTSTATUS 12/05/2023 FINAL 12/02/2023 1136     Radiological Exams on Admission: CT ABDOMEN PELVIS WO CONTRAST Result Date: 06/08/2024 CLINICAL DATA:  Abdominal pain. EXAM:  CT ABDOMEN AND PELVIS WITHOUT CONTRAST TECHNIQUE: Multidetector CT imaging of the abdomen and pelvis was performed following the standard protocol without IV contrast. RADIATION DOSE REDUCTION: This exam was performed according to the departmental dose-optimization program which includes automated exposure control, adjustment of the mA and/or kV according to patient size and/or use of iterative reconstruction technique. COMPARISON:  05/28/2024. FINDINGS: Lower chest: Scattered subsegmental volume loss in the lung bases. No pleural fluid. Heart is at the upper limits of normal in size. Age advanced right coronary artery calcification. No pericardial or pleural effusion. Air and fluid in the distal esophagus. Hepatobiliary: Liver is borderline enlarged, 17.9 cm. Liver and gallbladder are otherwise unremarkable. No biliary ductal dilatation. Pancreas: Negative. Spleen: Negative. Adrenals/Urinary Tract: Adrenal glands and kidneys are unremarkable. Ureters are decompressed. Bladder is largely obscured by streak artifact from a right hip arthroplasty. Stomach/Bowel: Small hiatal hernia. Gastric bypass. There may be a mildly atonic segment  at the small bowel anastomosis, deep to the umbilicus, nonspecific. Stomach, small bowel, appendix and colon are otherwise unremarkable. Vascular/Lymphatic: Atherosclerotic calcification of the aorta. No pathologically enlarged lymph nodes. Reproductive: Hysterectomy. Right hip arthroplasty creates streak artifact in the anatomic pelvis. No definite adnexal mass. Other: No free fluid.  Mesenteries and peritoneum are unremarkable. Musculoskeletal: Right hip arthroplasty. Degenerative changes in the spine. Old T9 compression fracture. L1 compression fracture is similar to minimally progressive from 05/24/2024. IMPRESSION: 1. No acute findings. 2. L1 compression fracture is similar to minimally progressive from 05/24/2024. 3. Age advanced right coronary artery calcification. 4.  Aortic atherosclerosis (ICD10-I70.0). Electronically Signed   By: Newell Eke M.D.   On: 06/08/2024 13:47   _______________________________________________________________________________________________________ Latest  Blood pressure (!) 145/77, pulse 99, temperature 97.6 F (36.4 C), temperature source Oral, resp. rate 17, last menstrual period 01/08/2011, SpO2 96%.   Vitals  labs and radiology finding personally reviewed  Review of Systems:    Pertinent positives include:  , abdominal pain, nausea, vomiting,   Constitutional:  No weight loss, night sweats, Fevers, chills, fatigue, weight loss  HEENT:  No headaches, Difficulty swallowing,Tooth/dental problems,Sore throat,  No sneezing, itching, ear ache, nasal congestion, post nasal drip,  Cardio-vascular:  No chest pain, Orthopnea, PND, anasarca, dizziness, palpitations.no Bilateral lower extremity swelling  GI:  No heartburn, indigestiondiarrhea, change in bowel habits, loss of appetite, melena, blood in stool, hematemesis Resp:  no shortness of breath at rest. No dyspnea on exertion, No excess mucus, no productive cough, No non-productive cough, No coughing up of  blood.No change in color of mucus.No wheezing. Skin:  no rash or lesions. No jaundice GU:  no dysuria, change in color of urine, no urgency or frequency. No straining to urinate.  No flank pain.  Musculoskeletal:  No joint pain or no joint swelling. No decreased range of motion. No back pain.  Psych:  No change in mood or affect. No depression or anxiety. No memory loss.  Neuro: no localizing neurological complaints, no tingling, no weakness, no double vision, no gait abnormality, no slurred speech, no confusion  All systems reviewed and apart from HOPI all are negative _______________________________________________________________________________________________ Past Medical History:   Past Medical History:  Diagnosis Date   Abdominal pain    Accidental drug overdose 09/2011   Anxiety    Atrial fibrillation (HCC) 09/29/2011   converted spontaneously   Chronic back pain    Chronic knee pain    Chronic nausea    Chronic pain    COPD (chronic obstructive pulmonary disease) (HCC)  Depression    Diabetes mellitus    states her doctor took her off all DM meds in past month   Diabetic neuropathy (HCC)    Dyspnea    with exertion    GERD (gastroesophageal reflux disease)    Headache(784.0)    migraines    HTN (hypertension)    not on meds since in a year    Hyperlipidemia    Hypothyroidism    not on meds in a while    Mental disorder    Bipolar and schizophrenic   Nausea and vomiting 01/02/2023   Requires supplemental oxygen     as needed per patient    Schizophrenia (HCC)    Schizophrenia, acute (HCC) 11/13/2017   Tobacco abuse       Past Surgical History:  Procedure Laterality Date   ABDOMINAL HYSTERECTOMY     BLADDER SUSPENSION  03/04/2011   Procedure: [facility] PROCEDURE;  Surgeon: Glendia LABOR MacDiarmid;  Location: [facility]S;  Service: Urology;  Laterality: N/A;   BOWEL RESECTION N/A 04/18/2022   Procedure: SMALL BOWEL RESECTION;  Surgeon: Vanderbilt Ned, MD;   Location: [facility] OR;  Service: General;  Laterality: N/A;   COLONOSCOPY N/A 06/02/2024   Procedure: COLONOSCOPY;  Surgeon: Leigh Elspeth SQUIBB, MD;  Location: [facility] ENDOSCOPY;  Service: Gastroenterology;  Laterality: N/A;   CYSTOCELE REPAIR  03/04/2011   Procedure: ANTERIOR REPAIR (CYSTOCELE);  Surgeon: Glendia LABOR Elizabeth;  Location: [facility];  Service: Urology;  Laterality: N/A;   CYSTOSCOPY  03/04/2011   Procedure: CYSTOSCOPY;  Surgeon: Glendia LABOR MacDiarmid;  Location: [facility];  Service: Urology;  Laterality: N/A;   ESOPHAGOGASTRODUODENOSCOPY N/A 06/02/2024   Procedure: EGD (ESOPHAGOGASTRODUODENOSCOPY);  Surgeon: Leigh Elspeth SQUIBB, MD;  Location: [facility] ENDOSCOPY;  Service: Gastroenterology;  Laterality: N/A;   ESOPHAGOGASTRODUODENOSCOPY (EGD) WITH PROPOFOL  N/A 05/12/2017   Procedure: ESOPHAGOGASTRODUODENOSCOPY (EGD) WITH PROPOFOL ;  Surgeon: Ethyl Lenis, MD;  Location: [facility] ENDOSCOPY;  Service: General;  Laterality: N/A;   GASTRIC ROUX-EN-Y N/A 03/25/2016   Procedure: LAPAROSCOPIC ROUX-EN-Y GASTRIC BYPASS WITH UPPER ENDOSCOPY;  Surgeon: Morene Olives, MD;  Location: [facility]S;  Service: General;  Laterality: N/A;   KNEE SURGERY     LAPAROSCOPIC ASSISTED VAGINAL HYSTERECTOMY  03/04/2011   Procedure: LAPAROSCOPIC ASSISTED VAGINAL HYSTERECTOMY;  Surgeon: Rosaline LITTIE Cobble, MD;  Location: [facility];  Service: Gynecology;  Laterality: N/A;   LAPAROTOMY N/A 04/18/2022   Procedure: EXPLORATORY LAPAROTOMY;  Surgeon: Vanderbilt Ned, MD;  Location: [facility] OR;  Service: General;  Laterality: N/A;   LAPAROTOMY N/A 04/24/2022   Procedure: BRING BACK EXPLORATORY LAPAROTOMY;  Surgeon: Sebastian Moles, MD;  Location: [facility] OR;  Service: General;  Laterality: N/A;   TOTAL HIP ARTHROPLASTY Right 08/27/2022   Procedure: TOTAL HIP ARTHROPLASTY;  Surgeon: Edna Toribio LABOR, MD;  Location: [facility] OR;  Service: Orthopedics;  Laterality: Right;    Social History:  Ambulatory   independently      reports that she has been smoking  cigarettes. She has never used smokeless tobacco. She reports current alcohol  use. She reports current drug use. Drugs: Cocaine, Marijuana, and Crack cocaine.     Family History:   Family History  Problem Relation Age of Onset   Diabetes Mother    Heart disease Mother    Hypertension Mother    Heart attack Father        54s   Heart attack Sister        58   COPD Other    Breast cancer Neg Hx    ______________________________________________________________________________________________ Allergies:  Allergies[1]   Prior to Admission medications  Medication Sig Start Date End Date Taking? Authorizing Provider  albuterol  (VENTOLIN  HFA) 108 (90 Base) MCG/ACT inhaler Inhale 2 puffs into the lungs every 4 (four) hours as needed for wheezing or shortness of breath. 05/28/24   Lemly, Tatum N, MD  divalproex  (DEPAKOTE  ER) 250 MG 24 hr tablet Take 1 tablet (250 mg total) by mouth daily. 05/28/24   Simon Lavonia SAILOR, MD  folic acid  (FOLVITE ) 1 MG tablet Take 1 tablet (1 mg total) by mouth daily. 06/04/24   Rashid, Farhan, MD  levETIRAcetam  (KEPPRA ) 250 MG tablet Take 1 tablet (250 mg total) by mouth 2 (two) times daily. 05/23/24   Medina-Vargas, Monina C, NP  metFORMIN  (GLUCOPHAGE ) 500 MG tablet Take 500 mg by mouth daily.    [provider]  metFORMIN  (GLUCOPHAGE -XR) 750 MG 24 hr tablet Take 1 tablet (750 mg total) by mouth daily with breakfast. Patient not taking: Reported on 05/28/2024 05/28/24   Simon Lavonia SAILOR, MD  ondansetron  (ZOFRAN -ODT) 4 MG disintegrating tablet Dissolve 1 tablet (4 mg total) by mouth every 8 (eight) hours as needed for nausea or vomiting. 05/25/24   Tegeler, Lonni PARAS, MD  oxyCODONE -acetaminophen  (PERCOCET/ROXICET) 5-325 MG tablet Take 1 tablet by mouth every 4 (four) hours as needed for severe pain (pain score 7-10). 05/25/24   Tegeler, Lonni PARAS, MD  OXYGEN  Inhale 4-5 L into the lungs See admin instructions. Use every night. May use throughout the day if  needed.    [provider]  pantoprazole  (PROTONIX ) 40 MG tablet Take 1 tablet (40 mg total) by mouth daily. 06/03/24   Rashid, Farhan, MD  thiamine  (VITAMIN B1) 100 MG tablet Take 1 tablet (100 mg total) by mouth daily. 05/28/24   Simon Lavonia SAILOR, MD  traZODone  (DESYREL ) 50 MG tablet Take 1 tablet (50 mg total) by mouth at bedtime as needed for sleep. 06/03/24   Dino Antu, MD    ___________________________________________________________________________________________________ Physical Exam:    06/08/2024    6:37 PM 06/08/2024    3:32 PM 06/08/2024   12:23 PM  Vitals with BMI  Systolic 145 166 841  Diastolic 77 86 84  Pulse 99 114 104     1. General:  in No  Acute distress   Chronically ill   -appearing 2. Psychological: Alert and   Oriented 3. Head/ENT:  Dry Mucous Membranes                          Head Non traumatic, neck supple                         Poor Dentition 4. SKIN: decreased Skin turgor,  Skin clean Dry and intact no rash    5. Heart: Regular rate and rhythm no  Murmur, no Rub or gallop 6. Lungs:   no wheezes or crackles   7. Abdomen: Soft, diffuse-tender, Non distended   obese  bowel sounds present 8. Lower extremities: no clubbing, cyanosis, no  edema 9. Neurologically Grossly intact, moving all 4 extremities equally tremulous no asterixis noted 10. MSK: Normal range of motion    Chart has been reviewed  ______________________________________________________________________________________________  Assessment/Plan  58 y.o. 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, iron  deficiency  anemia, history of alcohol , cocaine abuse,     Admitted for   Nausea and vomiting, dehydration alcohol  withdrawal    Present on  Admission:  Intractable nausea and vomiting  Abdominal pain  Chronic respiratory failure with hypoxia (HCC)  Alcohol  use disorder, severe, dependence (HCC)  Thoracic compression fracture (HCC)  GERD with esophagitis  Polysubstance (excluding opioids) dependence, daily use (HCC)  Tobacco abuse  Protein-calorie malnutrition, severe  COPD (chronic obstructive pulmonary disease) (HCC)  Schizoaffective disorder (HCC)  Iron  deficiency anemia  Alcohol  abuse  Dehydration  Hyponatremia     Abdominal pain CT abdomen nonacute continue supportive management patient with history of recurrent abdominal pain and gastritis  Chronic respiratory failure with hypoxia (HCC) Chronic stable continue to provide O2 at nighttime patient with chronic respiratory failure requiring 3 to 4 L at night secondary to COPD continue home nebulizers  Alcohol  use disorder, severe, dependence (HCC) Monitor for any sign of alcohol  withdrawal Monitoring progressive CIWA protocol  Thoracic compression fracture (HCC) Chronic seen on prior imaging supportive management  GERD with esophagitis Continue Protonix   Seizure disorder (HCC) Obtain Depakote  level continue Keppra  at home dose 250 twice daily changed to IV while having hard time with p.o. intake  Polysubstance (excluding opioids) dependence, daily use (HCC) Obtain drug screen  Tobacco abuse  - Spoke about importance of quitting spent 5 minutes discussing options for treatment, prior attempts at quitting, and dangers of smoking  -At this point patient is   NOT  interested in quitting  - order nicotine  patch   - nursing tobacco cessation protocol   Type 2 diabetes mellitus (HCC)  - Order Sensitive    SSI   ,  -  check TSH and HgA1C  - Hold by mouth medications*   Protein-calorie malnutrition, severe Order nutritional consult check prealbumin  COPD (chronic obstructive pulmonary disease) (HCC) Albuterol  PRN  Currently no  wheezing  Schizoaffective disorder (HCC) Continue home medications as much as patient able to tolerate  Iron  deficiency anemia Anemia stable monitor transfuse as needed for hemoglobin below 7  Alcohol  abuse Order CIWA protocol  Dehydration Rehydrate and follow fluid status check orthostatics prior to discharge  Hyponatremia In the setting of dehydration nausea vomiting history of alcohol  abuse Obtain electrolytes rehydrate follow sodium levels Hyponatremia seems to have improved after IV fluid rehydration   Other plan as per orders.  DVT prophylaxis:  SCD      Code Status:    Code Status: Prior FULL CODE  as per patient   I had personally discussed CODE STATUS with patient    ACP   none    Family Communication:   Family not at  Bedside    Diet  Diet Orders (From admission, onward)     Start     Ordered   06/08/24 1042  Diet NPO time specified  Diet effective now        06/08/24 1042            Disposition Plan:      To home once workup is complete and patient is stable   Following barriers for discharge:                                                         Electrolytes corrected  Pain controlled with PO medications     Consult Orders  (From admission, onward)           Start     Ordered   06/08/24 1841  Consult to hospitalist  Once       Provider:  (Not yet assigned)  Question Answer Comment  Place call to: [facility]   Reason for Consult Admit      06/08/24 1840                              Wound care  consulted              Consults called:    NONE   Admission status:  ED Disposition     ED Disposition  Admit   Condition  --   Comment  Hospital Area: [facility] [[id number]]  Level of Care: Progressive [102]  Admit to Progressive based on following criteria: MULTISYSTEM THREATS such as stable sepsis, metabolic/electrolyte imbalance with or without  encephalopathy that is responding to early treatment.  May place patient in observation at [facility] or [facility] if equivalent level of care is available:: No  Diagnosis: Intractable nausea and vomiting [720114]  Admitting Physician: Adair Lemar [[id number]]  Attending Physician: Dijon Kohlman [[id number]]           Obs     Level of care   progressive      Blease Quiver 06/08/2024, 9:14 PM    [facility]     after 2 AM please page floor coverage   If 7AM-7PM, please contact the day team taking care of the patient using [email or web address]        [1]  Allergies Allergen Reactions   Iron  Dextran Shortness Of Breath and Anxiety   Aspirin  Nausea And Vomiting and Other (See Comments)    Ok to take tylenol  or ibuprofen     Penicillins Other (See Comments)    Unknown reaction from childhood: family would like this to remain as an allergy.  Tolerated cephalosporins and zosyn 

## 2024-06-08 NOTE — ED Triage Notes (Signed)
 Pt bib [facility] from home with reports of abdominal pain that started this morning. Pt had recent SBO and d/c from hospital on 06/03/2024.

## 2024-06-08 NOTE — ED Notes (Signed)
 Wandering around w/r, steady gait, retching, no emesis. Alert, NAD, calm, interactive, resps e/u, speaking in clear copmplete sentences, skin W&D. Continues to list multiple complaints. Brought back to triage room to re-check VS. Not in room, upto to b/r.

## 2024-06-08 NOTE — Assessment & Plan Note (Signed)
 Obtain drug screen

## 2024-06-09 DIAGNOSIS — F259 Schizoaffective disorder, unspecified: Secondary | ICD-10-CM | POA: Diagnosis not present

## 2024-06-09 DIAGNOSIS — G40909 Epilepsy, unspecified, not intractable, without status epilepticus: Secondary | ICD-10-CM | POA: Diagnosis not present

## 2024-06-09 DIAGNOSIS — E119 Type 2 diabetes mellitus without complications: Secondary | ICD-10-CM | POA: Diagnosis not present

## 2024-06-09 DIAGNOSIS — J9611 Chronic respiratory failure with hypoxia: Secondary | ICD-10-CM | POA: Diagnosis not present

## 2024-06-09 DIAGNOSIS — K2100 Gastro-esophageal reflux disease with esophagitis, without bleeding: Secondary | ICD-10-CM | POA: Diagnosis not present

## 2024-06-09 DIAGNOSIS — D509 Iron deficiency anemia, unspecified: Secondary | ICD-10-CM | POA: Diagnosis not present

## 2024-06-09 DIAGNOSIS — S22000A Wedge compression fracture of unspecified thoracic vertebra, initial encounter for closed fracture: Secondary | ICD-10-CM | POA: Diagnosis not present

## 2024-06-09 DIAGNOSIS — F1010 Alcohol abuse, uncomplicated: Secondary | ICD-10-CM | POA: Diagnosis not present

## 2024-06-09 DIAGNOSIS — E871 Hypo-osmolality and hyponatremia: Secondary | ICD-10-CM | POA: Diagnosis not present

## 2024-06-09 DIAGNOSIS — J449 Chronic obstructive pulmonary disease, unspecified: Secondary | ICD-10-CM | POA: Diagnosis not present

## 2024-06-09 DIAGNOSIS — R112 Nausea with vomiting, unspecified: Secondary | ICD-10-CM | POA: Diagnosis not present

## 2024-06-09 DIAGNOSIS — F1920 Other psychoactive substance dependence, uncomplicated: Secondary | ICD-10-CM | POA: Diagnosis not present

## 2024-06-09 LAB — CBC
HCT: 29.8 % — ABNORMAL LOW (ref 36.0–46.0)
Hemoglobin: 9.2 g/dL — ABNORMAL LOW (ref 12.0–15.0)
MCH: 23.4 pg — ABNORMAL LOW (ref 26.0–34.0)
MCHC: 30.9 g/dL (ref 30.0–36.0)
MCV: 75.8 fL — ABNORMAL LOW (ref 80.0–100.0)
Platelets: 312 K/uL (ref 150–400)
RBC: 3.93 MIL/uL (ref 3.87–5.11)
RDW: 23.8 % — ABNORMAL HIGH (ref 11.5–15.5)
WBC: 8.3 K/uL (ref 4.0–10.5)
nRBC: 0 % (ref 0.0–0.2)

## 2024-06-09 LAB — COMPREHENSIVE METABOLIC PANEL WITH GFR
ALT: 102 U/L — ABNORMAL HIGH (ref 0–44)
AST: 149 U/L — ABNORMAL HIGH (ref 15–41)
Albumin: 3.5 g/dL (ref 3.5–5.0)
Alkaline Phosphatase: 142 U/L — ABNORMAL HIGH (ref 38–126)
Anion gap: 10 (ref 5–15)
BUN: <5 mg/dL — ABNORMAL LOW (ref 6–20)
CO2: 24 mmol/L (ref 22–32)
Calcium: 8.6 mg/dL — ABNORMAL LOW (ref 8.9–10.3)
Chloride: 102 mmol/L (ref 98–111)
Creatinine, Ser: 0.45 mg/dL (ref 0.44–1.00)
GFR, Estimated: >60 mL/min (ref >60)
Glucose, Bld: 104 mg/dL — ABNORMAL HIGH (ref 70–99)
Potassium: 3.6 mmol/L (ref 3.5–5.1)
Sodium: 135 mmol/L (ref 135–145)
Total Bilirubin: 0.7 mg/dL (ref 0.0–1.2)
Total Protein: 6.1 g/dL — ABNORMAL LOW (ref 6.5–8.1)

## 2024-06-09 LAB — GLUCOSE, CAPILLARY
Glucose-Capillary: 61 mg/dL — ABNORMAL LOW (ref 70–99)
Glucose-Capillary: 88 mg/dL (ref 70–99)
Glucose-Capillary: 187 mg/dL — ABNORMAL HIGH (ref 70–99)
Glucose-Capillary: 132 mg/dL — ABNORMAL HIGH (ref 70–99)
Glucose-Capillary: 43 mg/dL — CL (ref 70–99)
Glucose-Capillary: 47 mg/dL — ABNORMAL LOW (ref 70–99)
Glucose-Capillary: 177 mg/dL — ABNORMAL HIGH (ref 70–99)
Glucose-Capillary: 52 mg/dL — ABNORMAL LOW (ref 70–99)
Glucose-Capillary: 148 mg/dL — ABNORMAL HIGH (ref 70–99)

## 2024-06-09 LAB — PHOSPHORUS: Phosphorus: 3 mg/dL (ref 2.5–4.6)

## 2024-06-09 LAB — MAGNESIUM: Magnesium: 1.9 mg/dL (ref 1.7–2.4)

## 2024-06-09 LAB — BASIC METABOLIC PANEL WITH GFR
Anion gap: 11 (ref 5–15)
BUN: <5 mg/dL — ABNORMAL LOW (ref 6–20)
CO2: 23 mmol/L (ref 22–32)
Calcium: 9 mg/dL (ref 8.9–10.3)
Chloride: 102 mmol/L (ref 98–111)
Creatinine, Ser: 0.56 mg/dL (ref 0.44–1.00)
GFR, Estimated: >60 mL/min (ref >60)
Glucose, Bld: 70 mg/dL (ref 70–99)
Potassium: 4.2 mmol/L (ref 3.5–5.1)
Sodium: 136 mmol/L (ref 135–145)

## 2024-06-09 LAB — HEMOGLOBIN A1C
Hgb A1c MFr Bld: 6.2 % — ABNORMAL HIGH (ref 4.8–5.6)
Mean Plasma Glucose: 131.24 mg/dL

## 2024-06-09 LAB — OSMOLALITY, URINE: Osmolality, Ur: 461 mosm/kg (ref 300–900)

## 2024-06-09 LAB — PREALBUMIN: Prealbumin: 19 mg/dL (ref 18–38)

## 2024-06-09 LAB — TSH: TSH: 1.55 u[IU]/mL (ref 0.350–4.500)

## 2024-06-09 LAB — CK: Total CK: 214 U/L (ref 38–234)

## 2024-06-09 LAB — OSMOLALITY: Osmolality: 282 mosm/kg (ref 275–295)

## 2024-06-09 MED ORDER — SALINE SPRAY 0.65 % NA SOLN
1.0000 | NASAL | Status: DC | PRN
  Administered 2024-06-09: 16:00:00 1 via NASAL
  Filled 2024-06-09: qty 44

## 2024-06-09 MED ORDER — INSULIN ASPART 100 UNIT/ML IJ SOLN
0.0000 [IU] | Freq: Three times a day (TID) | INTRAMUSCULAR | Status: DC
  Administered 2024-06-09: 17:00:00 1 [IU] via SUBCUTANEOUS
  Filled 2024-06-09: qty 1

## 2024-06-09 MED ORDER — SODIUM CHLORIDE 0.9 % IV SOLN: INTRAVENOUS | Status: AC

## 2024-06-09 NOTE — Progress Notes (Signed)
 PROGRESS NOTE    [name]  FMW:[id number] DOB: 1966-04-26 DOA: 06/08/2024 PCP: [name], NP    Brief Narrative:  58 year old with history of paroxysmal A-fib, COPD on chronic 2 to 3 L of oxygen  at night at home, history of seizure disorder, anxiety, depression, bipolar, polysubstance abuse, history of Roux-en-Y surgery, alcohol  use and cocaine use who was recently in the hospital due to abdominal pain comes back to the hospital with nausea vomiting and retching.  Discharged on 12/12 with similar symptoms, underwent EGD and colonoscopy with normal findings.  CT scan was normal.  In the emergency room patient was hemodynamically stable.  Repeat CT scan was without any abnormality.  Patient also showed evidence of anxiety and tremors.  She was started on IV fluids, IV Protonix , nausea medications, given Ativan  for alcohol  withdrawal and admitted to the hospital.  Subjective: Patient seen and examined.  Patient tells me that she was able to take the liquid diet and is ready to have some soup.  Denies any abdominal pain.  She is not sure nausea will come back. Patient tells me that she continues to drink beer but no alcohol .  Currently looks comfortable without evidence of alcohol  withdrawal. Noted to have low blood sugar overnight.  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 Recent EGD colonoscopy was normal.  Hemoglobin is stable. Tolerating clears-challenge with full liquid diet.  Continue IV fluids. Continue IV Protonix  today.  Nausea medications.  Monitor closely for electrolytes. Continue symptomatic management. CT scan was without any acute findings.  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  Benzodiazepine based protocol.  Fall precautions.  Delirium precautions.  Currently  fairly stable.  Chronic medical issues including GERD, on Protonix  as above Seizure disorder, on Keppra  as above.  On Depakote .  Will keep IV today. COPD, fairly stable.  Albuterol  as needed. Type 2 diabetes on metformin , with hypoglycemia.  Hold metformin .  Encourage oral intake. Schizoaffective disorder: On Depakote .  Continue.     DVT prophylaxis: SCDs Start: 06/08/24 2241   Code Status: Full code Family Communication: Patient's daughter Ms. hartley called and updated Disposition Plan: Status is: Observation The patient will require care spanning > 2 midnights and should be moved to inpatient because: Unable to tolerate diet, alcohol  withdrawal     Consultants:  None  Procedures:  None  Antimicrobials:  None     Objective: Vitals:   06/08/24 2240 06/09/24 0313 06/09/24 0730 06/09/24 1057  BP:  121/72 130/76 (!) 140/85  Pulse:  89 98 99  Resp:  20 20 20   Temp:  98.5 F (36.9 C) 98.3 F (36.8 C) 98.2 F (36.8 C)  TempSrc:  Oral Oral Oral  SpO2:  97% 98% 98%  Weight: 58.9 kg     Height: 5' 1 (1.549 m)       Intake/Output Summary (Last 24 hours) at 06/09/2024 1157 Last data filed at 06/09/2024 0400 Gross per 24 hour  Intake 783.2 ml  Output --  Net 783.2 ml   Filed Weights   06/08/24 2240  Weight: 58.9 kg    Examination:  General exam: Appears calm, slightly anxious. Respiratory system: Clear to auscultation. Respiratory effort normal.  No added sounds.  On room air. Cardiovascular system: S1 & S2 heard, RRR. No pedal edema. Gastrointestinal system: Abdomen is nondistended, soft and nontender. No organomegaly or  masses felt. Normal bowel sounds heard. Central nervous system: Alert and awake.  Mostly oriented.  Flat affect.  No focal neurological deficits.    Data Reviewed: I have personally reviewed following labs and imaging studies  CBC: Recent Labs  Lab 06/08/24 1042 06/09/24 0116  WBC 4.4 8.3  HGB 10.0* 9.2*  HCT 32.8* 29.8*  MCV  75.2* 75.8*  PLT 383 312   Basic Metabolic Panel: Recent Labs  Lab 06/08/24 1042 06/08/24 1946 06/09/24 0116 06/09/24 0420  NA 128* 135 135 136  K 4.0 4.1 3.6 4.2  CL 92* 99 102 102  CO2 19* 22 24 23   GLUCOSE 145* 152* 104* 70  BUN <5* <5* <5* <5*  CREATININE 0.53 0.54 0.45 0.56  CALCIUM  8.6* 9.0 8.6* 9.0  MG  --  1.9 1.9  --   PHOS  --  3.1 3.0  --    GFR: Estimated Creatinine Clearance: 61.6 mL/min (by C-G formula based on SCr of 0.56 mg/dL). Liver Function Tests: Recent Labs  Lab 06/08/24 1042 06/09/24 0116  AST 56* 149*  ALT 62* 102*  ALKPHOS 159* 142*  BILITOT 0.3 0.7  PROT 7.2 6.1*  ALBUMIN  4.1 3.5   Recent Labs  Lab 06/08/24 1042  LIPASE 28   No results for input(s): AMMONIA in the last 168 hours. Coagulation Profile: Recent Labs  Lab 06/08/24 1946  INR 0.9   Cardiac Enzymes: Recent Labs  Lab 06/08/24 1946 06/09/24 0116  CKTOTAL 251* 214   BNP (last 3 results) No results for input(s): PROBNP in the last 8760 hours. HbA1C: Recent Labs    06/08/24 1946  HGBA1C 6.2*   CBG: Recent Labs  Lab 06/09/24 0406 06/09/24 0726 06/09/24 1100 06/09/24 1101 06/09/24 1123  GLUCAP 88 187* 47* 43* 132*   Lipid Profile: No results for input(s): CHOL, HDL, LDLCALC, TRIG, CHOLHDL, LDLDIRECT in the last 72 hours. Thyroid  Function Tests: Recent Labs    06/08/24 2236  TSH 1.550   Anemia Panel: No results for input(s): VITAMINB12, FOLATE, FERRITIN, TIBC, IRON , RETICCTPCT in the last 72 hours. Sepsis Labs: Recent Labs  Lab 06/08/24 1946 06/08/24 2236  PROCALCITON 23.80  --   LATICACIDVEN 4.3* 2.6*    No results found for this or any previous visit (from the past 240 hours).       Radiology Studies: CT ABDOMEN PELVIS WO CONTRAST Result Date: 06/08/2024 CLINICAL DATA:  Abdominal pain. EXAM: CT ABDOMEN AND PELVIS WITHOUT CONTRAST TECHNIQUE: Multidetector CT imaging of the abdomen and pelvis was performed  following the standard protocol without IV contrast. RADIATION DOSE REDUCTION: This exam was performed according to the departmental dose-optimization program which includes automated exposure control, adjustment of the mA and/or kV according to patient size and/or use of iterative reconstruction technique. COMPARISON:  05/28/2024. FINDINGS: Lower chest: Scattered subsegmental volume loss in the lung bases. No pleural fluid. Heart is at the upper limits of normal in size. Age advanced right coronary artery calcification. No pericardial or pleural effusion. Air and fluid in the distal esophagus. Hepatobiliary: Liver is borderline enlarged, 17.9 cm. Liver and gallbladder are otherwise unremarkable. No biliary ductal dilatation. Pancreas: Negative. Spleen: Negative. Adrenals/Urinary Tract: Adrenal glands and kidneys are unremarkable. Ureters are decompressed. Bladder is largely obscured by streak artifact from a right hip arthroplasty. Stomach/Bowel: Small hiatal hernia. Gastric bypass. There may be a mildly atonic segment at the small bowel anastomosis, deep to the umbilicus, nonspecific. Stomach, small bowel, appendix and colon are otherwise unremarkable. Vascular/Lymphatic: Atherosclerotic calcification of  the aorta. No pathologically enlarged lymph nodes. Reproductive: Hysterectomy. Right hip arthroplasty creates streak artifact in the anatomic pelvis. No definite adnexal mass. Other: No free fluid.  Mesenteries and peritoneum are unremarkable. Musculoskeletal: Right hip arthroplasty. Degenerative changes in the spine. Old T9 compression fracture. L1 compression fracture is similar to minimally progressive from 05/24/2024. IMPRESSION: 1. No acute findings. 2. L1 compression fracture is similar to minimally progressive from 05/24/2024. 3. Age advanced right coronary artery calcification. 4.  Aortic atherosclerosis (ICD10-I70.0). Electronically Signed   By: Newell Eke M.D.   On: 06/08/2024 13:47         Scheduled Meds:  divalproex   250 mg Oral Daily   folic acid   1 mg Oral Daily   guaiFENesin   600 mg Oral BID   insulin  aspart  0-6 Units Subcutaneous TID WC   levETIRAcetam   250 mg Intravenous BID   nicotine   21 mg Transdermal Daily   pantoprazole  (PROTONIX ) IV  40 mg Intravenous Q12H   thiamine   100 mg Oral Daily   Or   thiamine   100 mg Intravenous Daily   Continuous Infusions:  sodium chloride  100 mL/hr at 06/09/24 1024     LOS: 0 days       Renato Applebaum, MD [facility]

## 2024-06-09 NOTE — Plan of Care (Signed)
  Problem: Health Behavior/Discharge Planning: Goal: Ability to identify and utilize available resources and services will improve Outcome: Progressing Goal: Ability to manage health-related needs will improve Outcome: Progressing   Problem: Fluid Volume: Goal: Ability to maintain a balanced intake and output will improve Outcome: Progressing

## 2024-06-09 NOTE — TOC Initial Note (Signed)
 Transition of Care [facility]) - Initial/Assessment Note    Patient Details  Name: [name] MRN: [id number] Date of Birth: 1966-03-03  [facility]) CM/SW Contact:    Tawni CHRISTELLA Eva, LCSW Phone Number: 06/09/2024, 4:17 PM  Clinical Narrative:                  CSW spoke with the pts daughter. The pt is from home, and the plan is for her to return home. The pts daughter is requesting a call from the patients MD for an update on the patients status. CSW made the MD aware. The pt will need transportation assistance at the time of discharge. MOON completed, ICM to follow.   Expected Discharge Plan: Home/Self Care Barriers to Discharge: Continued Medical Work up   Patient Goals and CMS Choice Patient states their goals for this hospitalization and ongoing recovery are:: retrun home          Expected Discharge Plan and Services                                              Prior Living Arrangements/Services                       Activities of Daily Living   ADL Screening (condition at time of admission) Independently performs ADLs?: Yes (appropriate for developmental age) Is the patient deaf or have difficulty hearing?: No Does the patient have difficulty seeing, even when wearing glasses/contacts?: No Does the patient have difficulty concentrating, remembering, or making decisions?: No  Permission Sought/Granted                  Emotional Assessment              Admission diagnosis:  Hyponatremia [E87.1] Intractable nausea and vomiting [R11.2] Nausea and vomiting, unspecified vomiting type [R11.2] Patient Active Problem List   Diagnosis Date Noted   Intractable nausea and vomiting 06/08/2024   Dehydration 06/08/2024   Benign neoplasm of colon 06/02/2024   Altered mental status 05/31/2024   Colon stricture (HCC) 05/29/2024   Abnormal CT scan, gastrointestinal tract 05/29/2024   H/O gastric bypass 05/29/2024    Partial bowel obstruction (HCC) 05/28/2024   Chronic respiratory failure with hypoxia (HCC) 05/28/2024   Thoracic compression fracture (HCC) 05/28/2024   GERD with esophagitis 05/28/2024   Hiatal hernia 05/28/2024   Thrombocytopenia 05/28/2024   Alcohol  abuse 03/29/2024   Cocaine use 03/29/2024   Transient alteration of awareness 03/29/2024   Borderline personality disorder (HCC) 02/14/2024   Seizure (HCC) 01/24/2024   Polysubstance abuse (HCC) 01/24/2024   Lactic acidosis 01/24/2024   Protein-calorie malnutrition, severe 12/08/2023   Major depressive disorder in full remission 12/06/2023   History of syphilis 11/09/2023   Type 2 diabetes mellitus with diabetic polyneuropathy, with long-term current use of insulin  (HCC) 05/02/2023   Hyponatremia 02/22/2023   Continuous dependence on cigarette smoking 02/22/2023   Alcohol  use disorder, severe, dependence (HCC) 01/03/2023   Aspiration pneumonia of right lower lobe (HCC) 11/20/2022   Cocaine abuse (HCC) 08/23/2022   Suicide attempt by multiple drug overdose (HCC) 07/10/2022   Memory loss 04/17/2022   Constipation 02/12/2022   Iron  deficiency anemia 02/12/2022   Tobacco abuse 02/12/2022   Seizure disorder (HCC) 10/10/2020   Polysubstance (excluding opioids) dependence, daily use (HCC) 08/04/2018   Schizoaffective disorder (  HCC) 11/13/2017   Abdominal pain 04/18/2016   Type 2 diabetes mellitus (HCC) 09/06/2015   Primary insomnia 09/08/2014   Encounter for monitoring opioid maintenance therapy 07/10/2014   Low back pain 08/12/2013   Chronic pain 11/27/2012   COPD (chronic obstructive pulmonary disease) (HCC) 09/29/2011   PCP:  [name], NP Pharmacy:   [facility] [phone] [address], [place] - [address] [place] [address] Phone: [phone] Fax: [phone]  [place] - [facility] [address], [address] [place] [address] Phone: [phone]  Fax: [phone]     Social Drivers of Health (SDOH) Social History: SDOH Screenings   Food Insecurity: No Food Insecurity (06/08/2024)  Recent Concern: Food Insecurity - Food Insecurity Present (05/28/2024)  Housing: Low Risk (06/08/2024)  Recent Concern: Housing - High Risk (05/28/2024)  Transportation Needs: No Transportation Needs (06/08/2024)  Recent Concern: Transportation Needs - Unmet Transportation Needs (05/28/2024)  Utilities: Not At Risk (06/08/2024)  Alcohol  Screen: High Risk (02/14/2024)  Depression (PHQ2-9): High Risk (02/05/2024)  Financial Resource Strain: Low Risk (02/05/2024)  Physical Activity: Inactive (02/05/2024)  Social Connections: Socially Isolated (02/05/2024)  Stress: Stress Concern Present (02/05/2024)  Tobacco Use: High Risk (06/08/2024)   SDOH Interventions:     Readmission Risk Interventions    05/31/2024    2:28 PM 12/02/2023   12:47 PM 11/05/2023    1:34 PM  Readmission Risk Prevention Plan  Transportation Screening Complete Complete Complete  Medication Review (RN Care Manager) Referral to Pharmacy Referral to Pharmacy Referral to Pharmacy  PCP or Specialist appointment within 3-5 days of discharge Complete Complete Complete  HRI or Home Care Consult Complete Complete Complete  SW Recovery Care/Counseling Consult Complete Complete Complete  Palliative Care Screening Not Applicable Not Applicable Not Applicable  Skilled Nursing Facility Not Applicable Not Applicable Not Applicable

## 2024-06-09 NOTE — Care Management Obs Status (Signed)
 MEDICARE OBSERVATION STATUS NOTIFICATION   Patient Details  Name: [name] MRN: [id number] Date of Birth: [birth year]   Medicare Observation Status Notification Given:  Yes    Tawni CHRISTELLA Eva, LCSW 06/09/2024, 4:09 PM

## 2024-06-10 ENCOUNTER — Other Ambulatory Visit (HOSPITAL_COMMUNITY): Payer: Self-pay

## 2024-06-10 DIAGNOSIS — R112 Nausea with vomiting, unspecified: Secondary | ICD-10-CM | POA: Diagnosis not present

## 2024-06-10 LAB — COMPREHENSIVE METABOLIC PANEL WITH GFR
ALT: 112 U/L — ABNORMAL HIGH (ref 0–44)
AST: 108 U/L — ABNORMAL HIGH (ref 15–41)
Albumin: 4 g/dL (ref 3.5–5.0)
Alkaline Phosphatase: 160 U/L — ABNORMAL HIGH (ref 38–126)
Anion gap: 14 (ref 5–15)
BUN: <5 mg/dL — ABNORMAL LOW (ref 6–20)
CO2: 20 mmol/L — ABNORMAL LOW (ref 22–32)
Calcium: 8.8 mg/dL — ABNORMAL LOW (ref 8.9–10.3)
Chloride: 96 mmol/L — ABNORMAL LOW (ref 98–111)
Creatinine, Ser: 0.55 mg/dL (ref 0.44–1.00)
GFR, Estimated: >60 mL/min
Glucose, Bld: 177 mg/dL — ABNORMAL HIGH (ref 70–99)
Potassium: 4.9 mmol/L (ref 3.5–5.1)
Sodium: 129 mmol/L — ABNORMAL LOW (ref 135–145)
Total Bilirubin: 0.6 mg/dL (ref 0.0–1.2)
Total Protein: 7.1 g/dL (ref 6.5–8.1)

## 2024-06-10 LAB — CBC WITH DIFFERENTIAL/PLATELET
Abs Immature Granulocytes: 0.02 K/uL (ref 0.00–0.07)
Basophils Absolute: 0 K/uL (ref 0.0–0.1)
Basophils Relative: 1 %
Eosinophils Absolute: 0.1 K/uL (ref 0.0–0.5)
Eosinophils Relative: 2 %
HCT: 32.8 % — ABNORMAL LOW (ref 36.0–46.0)
Hemoglobin: 9.9 g/dL — ABNORMAL LOW (ref 12.0–15.0)
Immature Granulocytes: 0 %
Lymphocytes Relative: 32 %
Lymphs Abs: 1.6 K/uL (ref 0.7–4.0)
MCH: 23.8 pg — ABNORMAL LOW (ref 26.0–34.0)
MCHC: 30.2 g/dL (ref 30.0–36.0)
MCV: 78.8 fL — ABNORMAL LOW (ref 80.0–100.0)
Monocytes Absolute: 0.3 K/uL (ref 0.1–1.0)
Monocytes Relative: 6 %
Neutro Abs: 3 K/uL (ref 1.7–7.7)
Neutrophils Relative %: 59 %
Platelets: 287 K/uL (ref 150–400)
RBC: 4.16 MIL/uL (ref 3.87–5.11)
RDW: 25 % — ABNORMAL HIGH (ref 11.5–15.5)
Smear Review: NORMAL
WBC: 5.1 K/uL (ref 4.0–10.5)
nRBC: 0 % (ref 0.0–0.2)

## 2024-06-10 LAB — MAGNESIUM: Magnesium: 2.1 mg/dL (ref 1.7–2.4)

## 2024-06-10 LAB — GLUCOSE, CAPILLARY: Glucose-Capillary: 91 mg/dL (ref 70–99)

## 2024-06-10 MED ORDER — LANCETS MISC
1.0000 | 0 refills | Status: DC
  Filled 2024-06-10: qty 100, 25d supply, fill #0

## 2024-06-10 MED ORDER — LANCET DEVICE MISC
1.0000 | 0 refills | Status: DC
  Filled 2024-06-10: qty 1, fill #0

## 2024-06-10 MED ORDER — BLOOD GLUCOSE MONITOR SYSTEM W/DEVICE KIT
1.0000 | PACK | 0 refills | Status: DC
  Filled 2024-06-10: qty 1, 30d supply, fill #0

## 2024-06-10 MED ORDER — PANTOPRAZOLE SODIUM 40 MG PO TBEC
40.0000 mg | DELAYED_RELEASE_TABLET | Freq: Every day | ORAL | 2 refills | Status: DC
  Filled 2024-06-10: qty 30, 30d supply, fill #0

## 2024-06-10 MED ORDER — BLOOD GLUCOSE TEST VI STRP
1.0000 | ORAL_STRIP | 0 refills | Status: DC
  Filled 2024-06-10: qty 100, 25d supply, fill #0

## 2024-06-10 MED ORDER — ALBUTEROL SULFATE HFA 108 (90 BASE) MCG/ACT IN AERS
2.0000 | INHALATION_SPRAY | RESPIRATORY_TRACT | 1 refills | Status: DC | PRN
  Filled 2024-06-10: qty 18, 25d supply, fill #0
  Filled 2024-07-11: qty 18, 25d supply, fill #1

## 2024-06-10 NOTE — Progress Notes (Signed)
 Discharge meds in a secure bag delivered to patient in room by this RN

## 2024-06-10 NOTE — TOC Transition Note (Signed)
 Transition of Care [facility]) - Discharge Note   Patient Details  Name: [name] MRN: [id number] Date of Birth: 01-01-1966  [facility] CM/SW Contact:  Tawni CHRISTELLA Eva, LCSW Phone Number: 06/10/2024, 10:40 AM   Clinical Narrative:     Pt to d/c home, pt was provided with taxi voucher home. No further ICM needs, ICM sign off.   Final next level of care: Home/Self Care Barriers to Discharge: Barriers Resolved   Patient Goals and CMS Choice Patient states their goals for this hospitalization and ongoing recovery are:: retrun home          Discharge Placement                    Patient and family notified of of transfer: 06/10/24  Discharge Plan and Services Additional resources added to the After Visit Summary for                                       Social Drivers of Health (SDOH) Interventions SDOH Screenings   Food Insecurity: No Food Insecurity (06/08/2024)  Recent Concern: Food Insecurity - Food Insecurity Present (05/28/2024)  Housing: Low Risk (06/08/2024)  Recent Concern: Housing - High Risk (05/28/2024)  Transportation Needs: No Transportation Needs (06/08/2024)  Recent Concern: Transportation Needs - Unmet Transportation Needs (05/28/2024)  Utilities: Not At Risk (06/08/2024)  Alcohol  Screen: High Risk (02/14/2024)  Depression (PHQ2-9): High Risk (02/05/2024)  Financial Resource Strain: Low Risk (02/05/2024)  Physical Activity: Inactive (02/05/2024)  Social Connections: Socially Isolated (02/05/2024)  Stress: Stress Concern Present (02/05/2024)  Tobacco Use: High Risk (06/08/2024)     Readmission Risk Interventions    05/31/2024    2:28 PM 12/02/2023   12:47 PM 11/05/2023    1:34 PM  Readmission Risk Prevention Plan  Transportation Screening Complete Complete Complete  Medication Review (RN Care Manager) Referral to Pharmacy Referral to Pharmacy Referral to Pharmacy  PCP or Specialist appointment within 3-5 days of  discharge Complete Complete Complete  HRI or Home Care Consult Complete Complete Complete  SW Recovery Care/Counseling Consult Complete Complete Complete  Palliative Care Screening Not Applicable Not Applicable Not Applicable  Skilled Nursing Facility Not Applicable Not Applicable Not Applicable

## 2024-06-10 NOTE — Discharge Summary (Signed)
 Physician Discharge Summary  LORIELLE BOEHNING FMW:[id number] DOB: [birth year] DOA: 06/08/2024  PCP: [name], NP  Admit date: 06/08/2024 Discharge date: 06/10/2024  Admitted From: Home Disposition: Home  Recommendations for Outpatient Follow-up:  Follow up with PCP in 1-2 weeks Please obtain CMP CBC/magnesium  in one week   Home Health: N/A Equipment/Devices: N/A  Discharge Condition: Stable CODE STATUS: Full code Diet recommendation: Low-carb diet  Discharge summary: 58 year old with history of paroxysmal A-fib, COPD on chronic 2 to 3 L of oxygen  at night at home, history of seizure disorder, anxiety, depression, bipolar, polysubstance abuse, history of Roux-en-Y surgery, alcohol  use and cocaine use who was recently in the hospital due to abdominal pain comes back to the hospital with nausea vomiting and retching.  Discharged on 12/12 with similar symptoms, underwent EGD and colonoscopy with normal findings.  CT scan was normal.  In the emergency room patient was hemodynamically stable.  Repeat CT scan was without any abnormality.  Patient also showed evidence of anxiety and tremors.  She was started on IV fluids, IV Protonix , nausea medications, given Ativan  for alcohol  withdrawal and admitted to the hospital.  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 Recent EGD colonoscopy was normal.  Hemoglobin is stable. Patient was treated with Protonix , gradually introduced diet.  Now tolerating solid food. Normal bowel function. CT scan was without any acute findings. Will discharge patient home with Protonix  40 mg daily. Patient has slightly abnormal LFTs, will discussed about outpatient follow-up.   Alcohol  use disorder: Patient did not show any evidence of delirium or seizure.  She had some impulsiveness however currently stable.  Continue  multivitamins.   Chronic medical issues including GERD, on Protonix  as above Seizure disorder, on Keppra  as above.  On Depakote .  Resume. COPD, fairly stable.  Albuterol  as needed.  Patient needed refill.  Prescribed. Type 2 diabetes on metformin , resume metformin .  If persistent abdominal discomfort, may discontinue and this was suggested. Schizoaffective disorder: On Depakote .  Continue.  Patient is able to tolerate regular diet.  Discharged with suggestion to continue outpatient follow-up closely.   Discharge Diagnoses:  Principal Problem:   Intractable nausea and vomiting Active Problems:   Abdominal pain   COPD (chronic obstructive pulmonary disease) (HCC)   Chronic respiratory failure with hypoxia (HCC)   Alcohol  use disorder, severe, dependence (HCC)   Thoracic compression fracture (HCC)   GERD with esophagitis   Seizure disorder (HCC)   Hyponatremia   Schizoaffective disorder (HCC)   Polysubstance (excluding opioids) dependence, daily use (HCC)   Iron  deficiency anemia   Tobacco abuse   Alcohol  abuse   Type 2 diabetes mellitus (HCC)   Protein-calorie malnutrition, severe   Dehydration    Discharge Instructions  Discharge Instructions     Ambulatory referral to Neurology   Complete by: As directed    An appointment is requested in approximately: 4 weeks   Diet Carb Modified   Complete by: As directed    Increase activity slowly   Complete by: As directed       Allergies as of 06/10/2024       Reactions   Iron  Dextran Shortness Of Breath, Anxiety   Aspirin  Nausea And Vomiting, Other (See Comments)   Ok to take tylenol  or ibuprofen    Metformin  Hcl Er Diarrhea, Other (See Comments)   Gi issues with the 750 mg ER version   Penicillins Other (See Comments)  Unknown reaction from childhood: family would like this to remain as an allergy. Tolerated cephalosporins and zosyn         Medication List     TAKE these medications    Accu-Chek Guide Me  w/Device Kit Use up to four times daily as directed.   Accu-Chek Guide Test test strip Generic drug: glucose blood Use up to four times daily as directed.   Accu-Chek Softclix Lancets lancets Use up to four times daily as directed   divalproex  250 MG 24 hr tablet Commonly known as: Depakote  ER Take 1 tablet (250 mg total) by mouth daily.   folic acid  1 MG tablet Commonly known as: FOLVITE  Take 1 tablet (1 mg total) by mouth daily.   Ibuprofen  200 MG Caps Take 200-400 mg by mouth every 6 (six) hours as needed (for pain).   Lancet Device Misc 1 each by Does not apply route as directed. Dispense based on patient and insurance preference. Use up to four times daily as directed. (FOR ICD-10 E10.9, E11.9).   levETIRAcetam  250 MG tablet Commonly known as: Keppra  Take 1 tablet (250 mg total) by mouth 2 (two) times daily.   lidocaine  5 % Commonly known as: LIDODERM  Place 1 patch onto the skin daily as needed (for pain- Remove & Discard patch within 12 hours or as directed by MD).   metFORMIN  500 MG tablet Commonly known as: GLUCOPHAGE  Take 500 mg by mouth daily with breakfast. What changed: Another medication with the same name was removed. Continue taking this medication, and follow the directions you see here.   ondansetron  4 MG disintegrating tablet Commonly known as: ZOFRAN -ODT Dissolve 1 tablet (4 mg total) by mouth every 8 (eight) hours as needed for nausea or vomiting. What changed: reasons to take this   oxyCODONE -acetaminophen  5-325 MG tablet Commonly known as: PERCOCET/ROXICET Take 1 tablet by mouth every 4 (four) hours as needed for severe pain (pain score 7-10).   OXYGEN  Inhale 4-5 L/min into the lungs See admin instructions. Inhale 4-5 L/min into the lungs at bedtime and during the day as needed for shortness of breath   pantoprazole  40 MG tablet Commonly known as: Protonix  Take 1 tablet (40 mg total) by mouth daily.   thiamine  100 MG tablet Commonly known  as: VITAMIN B1 Take 1 tablet (100 mg total) by mouth daily.   traZODone  50 MG tablet Commonly known as: DESYREL  Take 1 tablet (50 mg total) by mouth at bedtime as needed for sleep.   Ventolin  HFA 108 (90 Base) MCG/ACT inhaler Generic drug: albuterol  Inhale 2 puffs into the lungs every 4 (four) hours as needed for wheezing or shortness of breath.        Follow-up Information     Medina-Vargas, Monina C, NP. Call in 1 week(s).   Specialty: Internal Medicine Contact information: [address] [place] [address] [phone]                Allergies[1]  Consultations: None   Procedures/Studies: CT ABDOMEN PELVIS WO CONTRAST Result Date: 06/08/2024 CLINICAL DATA:  Abdominal pain. EXAM: CT ABDOMEN AND PELVIS WITHOUT CONTRAST TECHNIQUE: Multidetector CT imaging of the abdomen and pelvis was performed following the standard protocol without IV contrast. RADIATION DOSE REDUCTION: This exam was performed according to the departmental dose-optimization program which includes automated exposure control, adjustment of the mA and/or kV according to patient size and/or use of iterative reconstruction technique. COMPARISON:  05/28/2024. FINDINGS: Lower chest: Scattered subsegmental volume loss in the lung bases.  No pleural fluid. Heart is at the upper limits of normal in size. Age advanced right coronary artery calcification. No pericardial or pleural effusion. Air and fluid in the distal esophagus. Hepatobiliary: Liver is borderline enlarged, 17.9 cm. Liver and gallbladder are otherwise unremarkable. No biliary ductal dilatation. Pancreas: Negative. Spleen: Negative. Adrenals/Urinary Tract: Adrenal glands and kidneys are unremarkable. Ureters are decompressed. Bladder is largely obscured by streak artifact from a right hip arthroplasty. Stomach/Bowel: Small hiatal hernia. Gastric bypass. There may be a mildly atonic segment at the small bowel anastomosis, deep to the umbilicus,  nonspecific. Stomach, small bowel, appendix and colon are otherwise unremarkable. Vascular/Lymphatic: Atherosclerotic calcification of the aorta. No pathologically enlarged lymph nodes. Reproductive: Hysterectomy. Right hip arthroplasty creates streak artifact in the anatomic pelvis. No definite adnexal mass. Other: No free fluid.  Mesenteries and peritoneum are unremarkable. Musculoskeletal: Right hip arthroplasty. Degenerative changes in the spine. Old T9 compression fracture. L1 compression fracture is similar to minimally progressive from 05/24/2024. IMPRESSION: 1. No acute findings. 2. L1 compression fracture is similar to minimally progressive from 05/24/2024. 3. Age advanced right coronary artery calcification. 4.  Aortic atherosclerosis (ICD10-I70.0). Electronically Signed   By: Newell Eke M.D.   On: 06/08/2024 13:47   CT ABDOMEN PELVIS W CONTRAST Result Date: 05/28/2024 EXAM: CT ABDOMEN AND PELVIS WITH CONTRAST 05/28/2024 08:48:03 AM TECHNIQUE: CT of the abdomen and pelvis was performed with the administration of 75 mL of iohexol  (OMNIPAQUE ) 350 MG/ML injection. Multiplanar reformatted images are provided for review. Automated exposure control, iterative reconstruction, and/or weight-based adjustment of the mA/kV was utilized to reduce the radiation dose to as low as reasonably achievable. COMPARISON: 05/24/24 CLINICAL HISTORY: Abdominal pain, acute, nonlocalized; worsening xray. FINDINGS: LOWER CHEST: Areas of subsegmental atelectasis are present within the lingula and in both lower lobes. A patchy area of ground-glass attenuation is noted within the medial left upper lobe. Coronary artery calcifications are present. LIVER: An unchanged, too small to characterize 4 mm hypodensity is noted in the right lobe of the liver. GALLBLADDER AND BILE DUCTS: Gallbladder is unremarkable. Fusiform dilatation of the common bile duct measures up to 8 mm, previously 6 mm. No calcified stone is identified within the  common bile duct. SPLEEN: The spleen is within normal limits in size and appearance. PANCREAS: The pancreas is normal in size and contour, without focal lesion or ductal dilatation. ADRENAL GLANDS: No periadrenal stranding is present. No nodule, thickening, or hemorrhage is seen. KIDNEYS, URETERS AND BLADDER: The kidneys are normal in size and morphology bilaterally. No stones in the kidneys or ureters. No hydronephrosis. No perinephric or periureteral stranding. The urinary bladder is obscured by streak artifact from the patient's right hip arthroplasty device. GI AND BOWEL: Circumferential wall thickening of the distal esophagus with a small to moderate hiatal hernia is noted. Postoperative changes from previous gastric bypass surgery are present. No pathologic dilatation of the small bowel loops or signs of bowel inflammation are seen. Moderate colonic stool burden is noted involving the ascending colon. There is progressive gaseous distention of the redundant colon to the level of the descending colon with additional gaseous distention of the sigmoid colon. The distal sigmoid colon and rectum are decreased in caliber. A focal short segment of luminal narrowing at the junction between the descending colon and sigmoid colon is noted in the left hemiabdomen, on coronal image 65/6. There are nondilated loops of small bowel noted lateral to the dilated descending colon, which may reflect an underlying internal hernia. PERITONEUM AND RETROPERITONEUM:  No free fluid or fluid collections are identified. No free air. VASCULATURE: Aorta is normal in caliber. Aortic atherosclerosis and aortic atherosclerotic calcifications are present. The mesenteric vasculature is patent. LYMPH NODES: No signs of abdominopelvic adenopathy are seen. REPRODUCTIVE ORGANS: The uterus is not visualized and may be surgically absent. No signs of adnexal mass are seen. BONES AND SOFT TISSUES: Previous right hip arthroplasty is noted. An unchanged  compression fracture involves the T9 vertebra. A subacute fracture involving the superior endplate of T9 is again seen with mild loss of the superior endplate height. Approximately 4 mm retropulsion of the superior posterior endplate is noted at this level. No new compression fractures are identified. No focal soft tissue abnormality. IMPRESSION: 1. Focal short segment luminal narrowing at the junction between the descending colon and sigmoid colon with associated gaseous distention of the redundant colon and sigmoid colon, and decreased caliber of the distal sigmoid colon and rectum. Nondilated small bowel loops lateral to the dilated descending colon may reflect an underlying internal hernia possibly resulting in partial obstruction of the left colon at the junction between the descending colon and sigmoid colon. 2. Fusiform dilatation of the common bile duct now measuring up to 8 mm, previously 6 mm. No calcified stone is identified. 3. Circumferential wall thickening of the distal esophagus with a small to moderate hiatal hernia. Electronically signed by: Waddell Calk MD 05/28/2024 09:27 AM EST RP Workstation: [id number]   DG Lumbar Spine Complete Result Date: 05/28/2024 EXAM: 4 VIEW(S) XRAY OF THE LUMBAR SPINE 05/28/2024 06:07:44 AM COMPARISON: Lumbar spine MRI 05/25/2024. CLINICAL HISTORY: fall? FINDINGS: LUMBAR SPINE: BONES: Stable appearance of the subacute fracture deformity involving the superior endplate of the L1 vertebra with less than 25 percent vertebral body height loss. No new fractures identified. The remaining lumbar vertebral body heights are well preserved. Alignment is normal. DISCS AND DEGENERATIVE CHANGES: The lumbar disc spaces are well preserved. No severe degenerative changes. SOFT TISSUES: Diffuse gaseous distention of the colon with moderate right-sided colonic stool burden. Signs of previous right total hip arthroplasty. No acute abnormality. IMPRESSION: 1. Stable subacute fracture  deformity involving the superior endplate of the L1 vertebra with less than 25 percent vertebral body height loss. No new fractures identified. Electronically signed by: Waddell Calk MD 05/28/2024 06:18 AM EST RP Workstation: [id number]   DG Abdomen Acute W/Chest Result Date: 05/28/2024 EXAM: UPRIGHT AND SUPINE XRAY VIEWS OF THE ABDOMEN AND 4 VIEW(S) OF THE CHEST 05/28/2024 06:07:44 AM COMPARISON: 05/25/2024 CLINICAL HISTORY: sob, constipation FINDINGS: LUNGS AND PLEURA: Mild elevation of the left hemidiaphragm with mild bibasilar atelectasis. No pleural effusion or pneumothorax. HEART AND MEDIASTINUM: Overlying cardiac leads. No acute abnormality of the cardiac and mediastinal silhouettes. BOWEL: Diffuse gaseous distention of the colon with moderate right-sided colonic stool burden. PERITONEUM AND SOFT TISSUES: No abnormal calcifications. No free air. BONES: Right total hip arthroplasty. No acute fracture. IMPRESSION: 1. Diffuse gaseous distention of the colon with moderate right-sided colonic stool burden. 2. Mild bibasilar atelectasis with mild elevation of the left hemidiaphragm. Electronically signed by: Waddell Calk MD 05/28/2024 06:16 AM EST RP Workstation: [id number]   DG Knee Complete 4 Views Right Result Date: 05/25/2024 CLINICAL DATA:  Fall. EXAM: RIGHT KNEE - COMPLETE 4+ VIEW COMPARISON:  None Available. FINDINGS: No evidence of fracture, dislocation, or joint effusion. There is moderate tricompartmental osteoarthrosis with joint space narrowing and osteophyte formation. Soft tissues are unremarkable. IMPRESSION: 1. No acute fracture or dislocation. 2. Moderate tricompartmental osteoarthrosis. Electronically Signed   By:  Greig Pique M.D.   On: 05/25/2024 22:36   DG Chest 2 View Result Date: 05/25/2024 CLINICAL DATA:  Chest pain after fall EXAM: DG CHEST 2V COMPARISON:  Chest x-ray 05/21/2024 FINDINGS: The heart size and mediastinal contours are within normal limits. Both lungs are clear. The  visualized skeletal structures are unremarkable. IMPRESSION: No active cardiopulmonary disease. Electronically Signed   By: Greig Pique M.D.   On: 05/25/2024 22:35   CT Cervical Spine Wo Contrast Result Date: 05/25/2024 CLINICAL DATA:  Status post trauma. EXAM: CT CERVICAL SPINE WITHOUT CONTRAST TECHNIQUE: Multidetector CT imaging of the cervical spine was performed without intravenous contrast. Multiplanar CT image reconstructions were also generated. RADIATION DOSE REDUCTION: This exam was performed according to the departmental dose-optimization program which includes automated exposure control, adjustment of the mA and/or kV according to patient size and/or use of iterative reconstruction technique. COMPARISON:  Oct 29, 2023 FINDINGS: Alignment: There is straightening of the normal cervical spine lordosis. Skull base and vertebrae: No acute fracture. No primary bone lesion or focal pathologic process. Soft tissues and spinal canal: No prevertebral fluid or swelling. No visible canal hematoma. Disc levels: Mild endplate sclerosis, anterior osteophyte formation and posterior bony spurring are seen at the levels of C3-C4, C4-C5 and C5-C6. Mild multilevel intervertebral disc space narrowing is seen throughout all levels of the cervical spine, slightly more prominent at the level of C2-C3. Mild, bilateral multilevel facet joint hypertrophy is noted. Upper chest: Negative. Other: None. IMPRESSION: 1. No acute fracture or subluxation in the cervical spine. 2. Mild multilevel degenerative changes. Electronically Signed   By: Suzen Dials M.D.   On: 05/25/2024 22:04   CT Head Wo Contrast Result Date: 05/25/2024 CLINICAL DATA:  Status post fall EXAM: CT HEAD WITHOUT CONTRAST TECHNIQUE: Contiguous axial images were obtained from the base of the skull through the vertex without intravenous contrast. RADIATION DOSE REDUCTION: This exam was performed according to the departmental dose-optimization program which  includes automated exposure control, adjustment of the mA and/or kV according to patient size and/or use of iterative reconstruction technique. COMPARISON:  None Available. FINDINGS: Brain: No evidence of acute infarction, hemorrhage, hydrocephalus, extra-axial collection or mass lesion/mass effect. Vascular: Mild bilateral cavernous carotid artery calcification is noted. Skull: Normal. Negative for fracture or focal lesion. Sinuses/Orbits: No acute finding. Other: None. IMPRESSION: No acute intracranial abnormality. Electronically Signed   By: Suzen Dials M.D.   On: 05/25/2024 22:01   MR LUMBAR SPINE WO CONTRAST Result Date: 05/25/2024 EXAM: MRI LUMBAR SPINE 05/25/2024 07:55:00 PM TECHNIQUE: Multiplanar multisequence MRI of the lumbar spine was performed without the administration of intravenous contrast. COMPARISON: CT abdomen and pelvis from 06/13/2024, lumbar spine radiograph 01/2024. CLINICAL HISTORY: Recent lumbar fracture with new numbness to right leg and falls. FINDINGS: BONES AND ALIGNMENT: Normal alignment except for findings at L1. There is a recent likely subacute wedge compression fracture of L1 with less than 25% height loss and approximately 2 mm of retropulsion of the posterior superior corner. Mild bone marrow edema is present at L1. Normal vertebral body heights otherwise. Bone marrow signal is unremarkable otherwise. SPINAL CORD: The conus terminates normally. SOFT TISSUES: No paraspinal mass. L1-L2: No significant disc herniation. No spinal canal stenosis or neural foraminal narrowing. L2-L3: No significant disc herniation. No spinal canal stenosis or neural foraminal narrowing. L3-L4: No significant disc herniation. No spinal canal stenosis or neural foraminal narrowing. L4-L5: Small disc bulge and moderate facet hypertrophy. No central spinal canal or neural foraminal stenosis. L5-S1:  No significant disc herniation. Mild facet hypertrophy. No central spinal canal or neural foraminal  stenosis. IMPRESSION: 1. Recent, likely subacute wedge compression fracture of L1 with less than 25% height loss, approximately 2 mm of retropulsion of the posterior superior corner, and mild bone marrow edema. Electronically signed by: Franky Stanford MD 05/25/2024 08:56 PM EST RP Workstation: [id number]   CT ABDOMEN PELVIS W CONTRAST Result Date: 05/24/2024 EXAM: CT ABDOMEN AND PELVIS WITH CONTRAST 05/24/2024 08:42:08 AM TECHNIQUE: CT of the abdomen and pelvis was performed with the administration of 80 mL of iohexol  (OMNIPAQUE ) 300 MG/ML solution. Multiplanar reformatted images are provided for review. Automated exposure control, iterative reconstruction, and/or weight-based adjustment of the mA/kV was utilized to reduce the radiation dose to as low as reasonably achievable. COMPARISON: Recent CT abdomen and pelvis 05/21/2024, and earlier. CLINICAL HISTORY: 58 year old female. Abdominal pain, acute, nonlocalized; Abdominal/flank pain, stone suspected. FINDINGS: LOWER CHEST: Mild lung base atelectasis, overall lung base ventilation has not significantly changed. LIVER: The liver is unremarkable. GALLBLADDER AND BILE DUCTS: Gallbladder is unremarkable. No biliary ductal dilatation. SPLEEN: No acute abnormality. PANCREAS: No acute abnormality. ADRENAL GLANDS: No acute abnormality. KIDNEYS, URETERS AND BLADDER: No stones in the kidneys or ureters. No hydronephrosis. No perinephric or periureteral stranding. Urinary bladder is unremarkable. 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 The mid transverse colon is more gas distended today, but otherwise stable bowel loops, no evidence of mechanical obstruction. No free air, free fluid, or mesenteric inflammation identified. PERITONEUM AND RETROPERITONEUM: No ascites. No free air. No free fluid. No mesenteric inflammation. VASCULATURE: Abdominal aortic atherosclerosis is moderate. Major arterial and portal venous  structures in the abdomen and pelvis appear patent. Aorta is normal in caliber. LYMPH NODES: No lymphadenopathy. REPRODUCTIVE ORGANS: Streak artifact again limits visualization of the deep pelvis. 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 Acute or subacute mild L1 compression fracture. L1 superior endplate compression fracture is new. Mild superior endplate deformity. L1 now measures 19.3 mm versus 22.8 mm normally. This is a 15% loss of vertebral body height. Subtle retropulsion of the posterior superior endplate on series 9 image 94. No significant spinal stenosis or complicating features. 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 No other acute osseous abnormality identified. IMPRESSION: 1. Acute mild L1 compression fracture with 15% loss of height. No significant retropulsion or other complicating features. 2. No other acute or inflammatory process identified in the abdomen or pelvis. 3. Stable roux-en-y type gastric bypass. Calcified aortic atherosclerosis. Electronically signed by: Helayne Hurst MD 05/24/2024 08:52 AM EST RP Workstation: [id number]   CT ABDOMEN PELVIS W CONTRAST Result Date: 05/21/2024 CLINICAL DATA:  Upper abdominal pain for 3 days with nausea and vomiting for 2 days. EXAM: CT ABDOMEN AND PELVIS WITH CONTRAST TECHNIQUE: Multidetector CT imaging of the abdomen and pelvis was performed using the standard protocol following bolus administration of intravenous contrast. RADIATION DOSE REDUCTION: This exam was performed according to the departmental dose-optimization program which includes automated exposure control, adjustment of the mA and/or kV according to patient size and/or use of iterative reconstruction technique. CONTRAST:  75mL OMNIPAQUE  IOHEXOL  350 MG/ML SOLN COMPARISON:  03/29/2024 and older exams.  FINDINGS: Lower chest: Clear lung bases. Hepatobiliary: No focal liver abnormality is seen. No gallstones, gallbladder wall thickening, or biliary dilatation. Pancreas: Unremarkable. No pancreatic ductal dilatation  or surrounding inflammatory changes. Spleen: Normal in size without focal abnormality. Adrenals/Urinary Tract: Normal adrenal glands. Normal kidneys and ureters. Bladder decompressed. Stomach/Bowel: Previous gastric bypass surgery. No evidence of bowel obstruction. No stomach or bowel wall thickening or inflammation. Vascular/Lymphatic: Aortic atherosclerosis. No aneurysm. No enlarged lymph nodes. Reproductive: Status post hysterectomy. No adnexal masses. Other: No hernia.  No ascites. Musculoskeletal: Chronic compression fracture of T9. No acute fracture. No bone lesion. 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 IMPRESSION: 1. No acute findings. 2. Previous Roux-en-Y gastric bypass, without evidence of bowel obstruction or inflammation and unchanged from the prior CT. 3. Aortic atherosclerosis. Electronically Signed   By: Alm Parkins M.D.   On: 05/21/2024 11:06   DG Chest 1 View Result Date: 05/21/2024 CLINICAL DATA:  Cough.  Shortness of breath. EXAM: CHEST  1 VIEW COMPARISON:  02/03/2024 FINDINGS: The heart size and mediastinal contours are within normal limits. Both lungs are clear. The visualized skeletal structures are unremarkable. IMPRESSION: No active disease. Electronically Signed   By: Norleen DELENA Kil M.D.   On: 05/21/2024 09:42   (Echo, Carotid, EGD, Colonoscopy, ERCP)    Subjective: Patient seen in the morning rounds. Patient tells me that she feels fine, she ate regular diet and did not have any nausea vomiting or abdominal discomfort.   Patient tells me that she needs to be discharged, she is craving for her cigarettes. Patient asked for refill of albuterol , also glucometer and supplies. Patient tells me that she gets diarrhea after taking seizure  medicine and wants to see if I can change her seizure medicine.  I informed her that I will refer her to neurology so she can have long-term follow-up.   Discharge Exam: Vitals:   06/09/24 2016 06/10/24 0427  BP: (!) 153/88 (!) 161/79  Pulse: (!) 103 (!) 108  Resp: 19 20  Temp: 98.4 F (36.9 C) 97.6 F (36.4 C)  SpO2: 100% 97%   Vitals:   06/09/24 1057 06/09/24 1227 06/09/24 2016 06/10/24 0427  BP: (!) 140/85 (!) 147/99 (!) 153/88 (!) 161/79  Pulse: 99 (!) 110 (!) 103 (!) 108  Resp: 20 20 19 20   Temp: 98.2 F (36.8 C) 97.6 F (36.4 C) 98.4 F (36.9 C) 97.6 F (36.4 C)  TempSrc: Oral Oral Oral Oral  SpO2: 98% 99% 100% 97%  Weight:      Height:        General: Pt is alert, awake, not in acute distress Walking around in the hallway.  Dressed up to leave. Alert awake and oriented.  No evidence of asterixis or tremors. Cardiovascular: RRR, S1/S2 +, no rubs, no gallops Respiratory: CTA bilaterally, no wheezing, no rhonchi Abdominal: Soft, NT, ND, bowel sounds + Extremities: no edema, no cyanosis    The results of significant diagnostics from this hospitalization (including imaging, microbiology, ancillary and laboratory) are listed below for reference.     Microbiology: No results found for this or any previous visit (from the past 240 hours).   Labs: BNP (last 3 results) Recent Labs    12/11/23 0349 12/12/23 0427 01/23/24 1900  BNP 55.2 95.6 17.0   Basic Metabolic Panel: Recent Labs  Lab 06/08/24 1042 06/08/24 1946 06/09/24 0116 06/09/24 0420 06/10/24 0910  NA 128* 135 135 136 129*  K 4.0 4.1 3.6 4.2 4.9  CL 92* 99 102 102 96*  CO2 19* 22 24 23  20*  GLUCOSE 145* 152* 104* 70 177*  BUN <5* <5* <5* <5* <5*  CREATININE 0.53 0.54 0.45 0.56 0.55  CALCIUM  8.6* 9.0 8.6* 9.0 8.8*  MG  --  1.9 1.9  --  2.1  PHOS  --  3.1 3.0  --   --    Liver Function Tests: Recent Labs  Lab 06/08/24 1042 06/09/24 0116 06/10/24 0910  AST 56* 149* 108*  ALT 62* 102*  112*  ALKPHOS 159* 142* 160*  BILITOT 0.3 0.7 0.6  PROT 7.2 6.1* 7.1  ALBUMIN  4.1 3.5 4.0   Recent Labs  Lab 06/08/24 1042  LIPASE 28   No results for input(s): AMMONIA in the last 168 hours. CBC: Recent Labs  Lab 06/08/24 1042 06/09/24 0116 06/10/24 0910  WBC 4.4 8.3 5.1  NEUTROABS  --   --  3.0  HGB 10.0* 9.2* 9.9*  HCT 32.8* 29.8* 32.8*  MCV 75.2* 75.8* 78.8*  PLT 383 312 287   Cardiac Enzymes: Recent Labs  Lab 06/08/24 1946 06/09/24 0116  CKTOTAL 251* 214   BNP: Invalid input(s): POCBNP CBG: Recent Labs  Lab 06/09/24 1123 06/09/24 1652 06/09/24 2018 06/09/24 2200 06/10/24 0808  GLUCAP 132* 177* 52* 148* 91   D-Dimer No results for input(s): DDIMER in the last 72 hours. Hgb A1c Recent Labs    06/08/24 1946  HGBA1C 6.2*   Lipid Profile No results for input(s): CHOL, HDL, LDLCALC, TRIG, CHOLHDL, LDLDIRECT in the last 72 hours. Thyroid  function studies Recent Labs    06/08/24 2236  TSH 1.550   Anemia work up No results for input(s): VITAMINB12, FOLATE, FERRITIN, TIBC, IRON , RETICCTPCT in the last 72 hours. Urinalysis    Component Value Date/Time   COLORURINE YELLOW 06/08/2024 1116   APPEARANCEUR CLEAR 06/08/2024 1116   LABSPEC 1.024 06/08/2024 1116   PHURINE 5.0 06/08/2024 1116   GLUCOSEU 50 (A) 06/08/2024 1116   HGBUR NEGATIVE 06/08/2024 1116   BILIRUBINUR NEGATIVE 06/08/2024 1116   KETONESUR NEGATIVE 06/08/2024 1116   PROTEINUR NEGATIVE 06/08/2024 1116   UROBILINOGEN 0.2 09/04/2013 0426   NITRITE NEGATIVE 06/08/2024 1116   LEUKOCYTESUR NEGATIVE 06/08/2024 1116   Sepsis Labs Recent Labs  Lab 06/08/24 1042 06/09/24 0116 06/10/24 0910  WBC 4.4 8.3 5.1   Microbiology No results found for this or any previous visit (from the past 240 hours).   Time coordinating discharge: 35 minutes  SIGNED:   Renato Applebaum, MD  [facility] 06/10/2024, 10:41 AM     [1]  Allergies Allergen Reactions    Iron  Dextran Shortness Of Breath and Anxiety   Aspirin  Nausea And Vomiting and Other (See Comments)    Ok to take tylenol  or ibuprofen     Metformin  Hcl Er Diarrhea and Other (See Comments)    Gi issues with the 750 mg ER version   Penicillins Other (See Comments)    Unknown reaction from childhood: family would like this to remain as an allergy.  Tolerated cephalosporins and zosyn 

## 2024-06-10 NOTE — Plan of Care (Signed)
  Problem: Elimination: Goal: Will not experience complications related to bowel motility Outcome: Adequate for Discharge   Problem: Elimination: Goal: Will not experience complications related to urinary retention Outcome: Adequate for Discharge

## 2024-06-11 ENCOUNTER — Emergency Department (HOSPITAL_COMMUNITY)
Admission: EM | Admit: 2024-06-11 | Discharge: 2024-06-11 | Discharge status: Against Medical Advice | Attending: Emergency Medicine | Admitting: Emergency Medicine

## 2024-06-11 ENCOUNTER — Other Ambulatory Visit: Payer: Self-pay

## 2024-06-11 DIAGNOSIS — R109 Unspecified abdominal pain: Secondary | ICD-10-CM | POA: Diagnosis present

## 2024-06-11 DIAGNOSIS — Z5321 Procedure and treatment not carried out due to patient leaving prior to being seen by health care provider: Secondary | ICD-10-CM | POA: Diagnosis not present

## 2024-06-11 NOTE — ED Triage Notes (Signed)
 Patient arrives via EMS from home. Patient asked EMS to drive her to her daughter's house to get some medical records and EMS declined so brought her here. Patient ambulatory and states she does not want to be here on arrival.

## 2024-06-15 ENCOUNTER — Emergency Department (HOSPITAL_COMMUNITY)

## 2024-06-15 ENCOUNTER — Observation Stay (HOSPITAL_COMMUNITY)
Admission: EM | Admit: 2024-06-15 | Discharge: 2024-06-16 | Disposition: A | Discharge status: Home / Self Care | Attending: Family Medicine | Admitting: Family Medicine

## 2024-06-15 ENCOUNTER — Encounter (HOSPITAL_COMMUNITY): Payer: Self-pay

## 2024-06-15 ENCOUNTER — Other Ambulatory Visit: Payer: Self-pay

## 2024-06-15 DIAGNOSIS — K2100 Gastro-esophageal reflux disease with esophagitis, without bleeding: Secondary | ICD-10-CM | POA: Insufficient documentation

## 2024-06-15 DIAGNOSIS — Z79899 Other long term (current) drug therapy: Secondary | ICD-10-CM | POA: Insufficient documentation

## 2024-06-15 DIAGNOSIS — F259 Schizoaffective disorder, unspecified: Secondary | ICD-10-CM | POA: Diagnosis not present

## 2024-06-15 DIAGNOSIS — R569 Unspecified convulsions: Principal | ICD-10-CM

## 2024-06-15 DIAGNOSIS — J9611 Chronic respiratory failure with hypoxia: Secondary | ICD-10-CM | POA: Insufficient documentation

## 2024-06-15 DIAGNOSIS — F1020 Alcohol dependence, uncomplicated: Secondary | ICD-10-CM | POA: Diagnosis not present

## 2024-06-15 DIAGNOSIS — J449 Chronic obstructive pulmonary disease, unspecified: Secondary | ICD-10-CM | POA: Diagnosis not present

## 2024-06-15 DIAGNOSIS — I10 Essential (primary) hypertension: Secondary | ICD-10-CM | POA: Diagnosis not present

## 2024-06-15 DIAGNOSIS — T426X6A Underdosing of other antiepileptic and sedative-hypnotic drugs, initial encounter: Secondary | ICD-10-CM | POA: Diagnosis not present

## 2024-06-15 DIAGNOSIS — F1410 Cocaine abuse, uncomplicated: Secondary | ICD-10-CM | POA: Diagnosis not present

## 2024-06-15 DIAGNOSIS — F209 Schizophrenia, unspecified: Secondary | ICD-10-CM

## 2024-06-15 DIAGNOSIS — F1290 Cannabis use, unspecified, uncomplicated: Secondary | ICD-10-CM | POA: Insufficient documentation

## 2024-06-15 DIAGNOSIS — R112 Nausea with vomiting, unspecified: Secondary | ICD-10-CM | POA: Diagnosis present

## 2024-06-15 DIAGNOSIS — E039 Hypothyroidism, unspecified: Secondary | ICD-10-CM | POA: Insufficient documentation

## 2024-06-15 DIAGNOSIS — F1920 Other psychoactive substance dependence, uncomplicated: Secondary | ICD-10-CM | POA: Insufficient documentation

## 2024-06-15 DIAGNOSIS — F1910 Other psychoactive substance abuse, uncomplicated: Secondary | ICD-10-CM | POA: Diagnosis present

## 2024-06-15 DIAGNOSIS — E119 Type 2 diabetes mellitus without complications: Secondary | ICD-10-CM | POA: Insufficient documentation

## 2024-06-15 DIAGNOSIS — F17210 Nicotine dependence, cigarettes, uncomplicated: Secondary | ICD-10-CM | POA: Diagnosis not present

## 2024-06-15 DIAGNOSIS — Z720 Tobacco use: Secondary | ICD-10-CM | POA: Diagnosis present

## 2024-06-15 LAB — BASIC METABOLIC PANEL WITH GFR
Anion gap: 16 — ABNORMAL HIGH (ref 5–15)
BUN: 9 mg/dL (ref 6–20)
CO2: 22 mmol/L (ref 22–32)
Calcium: 8.6 mg/dL — ABNORMAL LOW (ref 8.9–10.3)
Chloride: 103 mmol/L (ref 98–111)
Creatinine, Ser: 0.73 mg/dL (ref 0.44–1.00)
GFR, Estimated: >60 mL/min
Glucose, Bld: 174 mg/dL — ABNORMAL HIGH (ref 70–99)
Potassium: 4.5 mmol/L (ref 3.5–5.1)
Sodium: 140 mmol/L (ref 135–145)

## 2024-06-15 LAB — URINE DRUG SCREEN
Amphetamines: NEGATIVE
Barbiturates: NEGATIVE
Benzodiazepines: POSITIVE — AB
Cocaine: NEGATIVE
Fentanyl: NEGATIVE
Methadone Scn, Ur: NEGATIVE
Opiates: NEGATIVE
Tetrahydrocannabinol: NEGATIVE

## 2024-06-15 LAB — CBC WITH DIFFERENTIAL/PLATELET
Abs Immature Granulocytes: 0.05 K/uL (ref 0.00–0.07)
Basophils Absolute: 0 K/uL (ref 0.0–0.1)
Basophils Relative: 0 %
Eosinophils Absolute: 0 K/uL (ref 0.0–0.5)
Eosinophils Relative: 0 %
HCT: 36.1 % (ref 36.0–46.0)
Hemoglobin: 11.2 g/dL — ABNORMAL LOW (ref 12.0–15.0)
Immature Granulocytes: 1 %
Lymphocytes Relative: 4 %
Lymphs Abs: 0.4 K/uL — ABNORMAL LOW (ref 0.7–4.0)
MCH: 23.8 pg — ABNORMAL LOW (ref 26.0–34.0)
MCHC: 31 g/dL (ref 30.0–36.0)
MCV: 76.8 fL — ABNORMAL LOW (ref 80.0–100.0)
Monocytes Absolute: 0.5 K/uL (ref 0.1–1.0)
Monocytes Relative: 4 %
Neutro Abs: 9.7 K/uL — ABNORMAL HIGH (ref 1.7–7.7)
Neutrophils Relative %: 91 %
Platelets: 264 K/uL (ref 150–400)
RBC: 4.7 MIL/uL (ref 3.87–5.11)
RDW: 25.7 % — ABNORMAL HIGH (ref 11.5–15.5)
Smear Review: NORMAL
WBC: 10.7 K/uL — ABNORMAL HIGH (ref 4.0–10.5)
nRBC: 0.2 % (ref 0.0–0.2)

## 2024-06-15 LAB — URINALYSIS, ROUTINE W REFLEX MICROSCOPIC
Bilirubin Urine: NEGATIVE
Glucose, UA: NEGATIVE mg/dL
Hgb urine dipstick: NEGATIVE
Ketones, ur: NEGATIVE mg/dL
Leukocytes,Ua: NEGATIVE
Nitrite: NEGATIVE
Protein, ur: 30 mg/dL — AB
Specific Gravity, Urine: 1.009 (ref 1.005–1.030)
pH: 6 (ref 5.0–8.0)

## 2024-06-15 LAB — CBC
HCT: 31.9 % — ABNORMAL LOW (ref 36.0–46.0)
Hemoglobin: 9.7 g/dL — ABNORMAL LOW (ref 12.0–15.0)
MCH: 23.7 pg — ABNORMAL LOW (ref 26.0–34.0)
MCHC: 30.4 g/dL (ref 30.0–36.0)
MCV: 78 fL — ABNORMAL LOW (ref 80.0–100.0)
Platelets: 140 K/uL — ABNORMAL LOW (ref 150–400)
RBC: 4.09 MIL/uL (ref 3.87–5.11)
RDW: 25.3 % — ABNORMAL HIGH (ref 11.5–15.5)
WBC: 7.9 K/uL (ref 4.0–10.5)
nRBC: 0 % (ref 0.0–0.2)

## 2024-06-15 LAB — RESP PANEL BY RT-PCR (RSV, FLU A&B, COVID)  RVPGX2
Influenza A by PCR: NEGATIVE
Influenza B by PCR: NEGATIVE
Resp Syncytial Virus by PCR: NEGATIVE
SARS Coronavirus 2 by RT PCR: NEGATIVE

## 2024-06-15 LAB — CBG MONITORING, ED: Glucose-Capillary: 174 mg/dL — ABNORMAL HIGH (ref 70–99)

## 2024-06-15 LAB — PRO BRAIN NATRIURETIC PEPTIDE: Pro Brain Natriuretic Peptide: 137 pg/mL

## 2024-06-15 LAB — CREATININE, SERUM
Creatinine, Ser: 0.65 mg/dL (ref 0.44–1.00)
GFR, Estimated: >60 mL/min

## 2024-06-15 LAB — I-STAT CG4 LACTIC ACID, ED: Lactic Acid, Venous: 3.6 mmol/L (ref 0.5–1.9)

## 2024-06-15 LAB — LIPASE, BLOOD: Lipase: 18 U/L (ref 11–51)

## 2024-06-15 LAB — GLUCOSE, CAPILLARY: Glucose-Capillary: 219 mg/dL — ABNORMAL HIGH (ref 70–99)

## 2024-06-15 LAB — ETHANOL: Alcohol, Ethyl (B): <15 mg/dL

## 2024-06-15 MED ORDER — FOLIC ACID 1 MG PO TABS: 1.0000 mg | ORAL_TABLET | Freq: Every day | ORAL | Status: DC

## 2024-06-15 MED ORDER — INSULIN ASPART 100 UNIT/ML IJ SOLN
0.0000 [IU] | Freq: Every day | INTRAMUSCULAR | Status: DC
  Administered 2024-06-16: 2 [IU] via SUBCUTANEOUS
  Filled 2024-06-15: qty 2

## 2024-06-15 MED ORDER — DIVALPROEX SODIUM ER 250 MG PO TB24
250.0000 mg | ORAL_TABLET | Freq: Every day | ORAL | Status: DC
  Administered 2024-06-16: 250 mg via ORAL
  Filled 2024-06-15: qty 1

## 2024-06-15 MED ORDER — ALBUTEROL SULFATE (2.5 MG/3ML) 0.083% IN NEBU: 2.5000 mg | INHALATION_SOLUTION | RESPIRATORY_TRACT | Status: DC | PRN

## 2024-06-15 MED ORDER — FOLIC ACID 1 MG PO TABS
1.0000 mg | ORAL_TABLET | Freq: Every day | ORAL | Status: DC
  Administered 2024-06-16: 1 mg via ORAL
  Filled 2024-06-15: qty 1

## 2024-06-15 MED ORDER — ENOXAPARIN SODIUM 40 MG/0.4ML IJ SOSY
40.0000 mg | PREFILLED_SYRINGE | INTRAMUSCULAR | Status: DC
  Administered 2024-06-15: 40 mg via SUBCUTANEOUS
  Filled 2024-06-15: qty 0.4

## 2024-06-15 MED ORDER — FENTANYL CITRATE (PF) 50 MCG/ML IJ SOSY
25.0000 ug | PREFILLED_SYRINGE | Freq: Once | INTRAMUSCULAR | Status: AC
  Administered 2024-06-15: 25 ug via INTRAVENOUS
  Filled 2024-06-15: qty 1

## 2024-06-15 MED ORDER — SODIUM CHLORIDE 0.9 % IV BOLUS
1000.0000 mL | Freq: Once | INTRAVENOUS | Status: AC
  Administered 2024-06-15: 1000 mL via INTRAVENOUS

## 2024-06-15 MED ORDER — PANTOPRAZOLE SODIUM 40 MG PO TBEC
40.0000 mg | DELAYED_RELEASE_TABLET | Freq: Every day | ORAL | Status: DC
  Administered 2024-06-16: 40 mg via ORAL
  Filled 2024-06-15: qty 1

## 2024-06-15 MED ORDER — THIAMINE MONONITRATE 100 MG PO TABS
100.0000 mg | ORAL_TABLET | Freq: Every day | ORAL | Status: DC
  Administered 2024-06-16: 100 mg via ORAL
  Filled 2024-06-15: qty 1

## 2024-06-15 MED ORDER — INSULIN ASPART 100 UNIT/ML IJ SOLN: 0.0000 [IU] | Freq: Three times a day (TID) | INTRAMUSCULAR | Status: DC

## 2024-06-15 MED ORDER — ALBUTEROL SULFATE HFA 108 (90 BASE) MCG/ACT IN AERS: 2.0000 | INHALATION_SPRAY | RESPIRATORY_TRACT | Status: DC | PRN

## 2024-06-15 MED ORDER — LEVETIRACETAM (KEPPRA) 500 MG/5 ML ADULT IV PUSH
500.0000 mg | Freq: Two times a day (BID) | INTRAVENOUS | Status: DC
  Administered 2024-06-16: 500 mg via INTRAVENOUS
  Filled 2024-06-15: qty 5

## 2024-06-15 MED ORDER — LEVETIRACETAM (KEPPRA) 500 MG/5 ML ADULT IV PUSH
2000.0000 mg | Freq: Once | INTRAVENOUS | Status: AC
  Administered 2024-06-15: 2000 mg via INTRAVENOUS
  Filled 2024-06-15: qty 20

## 2024-06-15 MED ORDER — ONDANSETRON HCL 4 MG/2ML IJ SOLN: 4.0000 mg | Freq: Four times a day (QID) | INTRAMUSCULAR | Status: DC | PRN

## 2024-06-15 MED ORDER — THIAMINE HCL 100 MG/ML IJ SOLN: 100.0000 mg | Freq: Every day | INTRAMUSCULAR | Status: DC

## 2024-06-15 MED ORDER — LEVETIRACETAM (KEPPRA) 500 MG/5 ML ADULT IV PUSH: 2000.0000 mg | Freq: Once | INTRAVENOUS | Status: DC

## 2024-06-15 MED ORDER — THIAMINE HCL 100 MG PO TABS: 100.0000 mg | ORAL_TABLET | Freq: Every day | ORAL | Status: DC

## 2024-06-15 MED ORDER — LORAZEPAM 2 MG/ML IJ SOLN
1.0000 mg | INTRAMUSCULAR | Status: DC | PRN
  Administered 2024-06-15: 2 mg via INTRAVENOUS
  Filled 2024-06-15: qty 1

## 2024-06-15 MED ORDER — LORAZEPAM 1 MG PO TABS
1.0000 mg | ORAL_TABLET | ORAL | Status: DC | PRN
  Administered 2024-06-16 (×2): 1 mg via ORAL
  Filled 2024-06-15 (×2): qty 1

## 2024-06-15 MED ORDER — LACTATED RINGERS IV SOLN: INTRAVENOUS | Status: DC

## 2024-06-15 MED ORDER — HALOPERIDOL LACTATE 5 MG/ML IJ SOLN
5.0000 mg | Freq: Once | INTRAMUSCULAR | Status: AC
  Administered 2024-06-15: 5 mg via INTRAMUSCULAR
  Filled 2024-06-15: qty 1

## 2024-06-15 MED ORDER — ADULT MULTIVITAMIN W/MINERALS CH
1.0000 | ORAL_TABLET | Freq: Every day | ORAL | Status: DC
  Administered 2024-06-16: 1 via ORAL
  Filled 2024-06-15: qty 1

## 2024-06-15 MED ORDER — ONDANSETRON HCL 4 MG PO TABS: 4.0000 mg | ORAL_TABLET | Freq: Four times a day (QID) | ORAL | Status: DC | PRN

## 2024-06-15 NOTE — Progress Notes (Signed)
 Pt has arrive

## 2024-06-15 NOTE — ED Notes (Signed)
 Pt attempted to use bedpan without success, pt unable to urinate, offered in and out cath but pt refused

## 2024-06-15 NOTE — ED Provider Notes (Signed)
" °  Physical Exam  BP (!) 141/75   Pulse (!) 108   Temp 99.5 F (37.5 C) (Oral)   Resp (!) 22   Ht 5' 1 (1.549 m)   Wt 58.9 kg   LMP 01/08/2011   SpO2 96%   BMI 24.54 kg/m   Physical Exam  Procedures  Procedures  ED Course / MDM   Clinical Course as of 06/15/24 1838  Wed Jun 15, 2024  1454 Paged neurology [MT]  1508 Dr Merrianne neurology recommending keppra  IV 2g loading dose and increasing home keppra  to 500 mg BID at home [MT]  1529 Confirmed with pharmacy patient most recently filled Keppra  and Depakote  as possible AED's (although both doses seem subtherapeutic as AED's).  Not on zonargan at this time.  Patient herself is too confused or postictal to tell me her meds, stating I don't know or whether she took them [MT]    Clinical Course User Index [MT] Cottie Donnice PARAS, MD   Medical Decision Making Care assumed at 3 PM.  Patient has a history of seizure and on low-dose Keppra  and Depakote .  Unclear if she is taking her medicine.  Patient apparently had another seizure.  Neurology recommend Keppra  load and observation  6:38 PM Patient is still altered.  Patient was briefly agitated requiring Haldol  but afterwards she is persistently sleepy and confused.  Discussed with Dr. Lindzen from neurology.  He states that patient needs to be admitted.  He recommend EEG tonight.  Patient has no seizure activity in the ER.  He recommend increase Keppra  to 500 mg twice daily.  Problems Addressed: Seizure [facility]): acute illness or injury  Amount and/or Complexity of Data Reviewed Labs: ordered. Decision-making details documented in ED Course. Radiology: ordered. ECG/medicine tests: ordered and independent interpretation performed. Decision-making details documented in ED Course.  Risk Prescription drug management. Decision regarding hospitalization.          Patt Alm Macho, MD 06/15/24 [phone]  "

## 2024-06-15 NOTE — ED Notes (Signed)
 Rn unable to obtain blood sample at this time.

## 2024-06-15 NOTE — ED Notes (Signed)
 Update given to pts daughter, [name].

## 2024-06-15 NOTE — Consult Note (Signed)
 NEUROLOGY CONSULT NOTE   Date of service: June 15, 2024 Patient Name: [name] MRN:  [id number] DOB:  01-06-1966 Chief Complaint: seizures Requesting Provider: Sim Emery LITTIE, MD  History of Present Illness  [name] is a 58 y.o. female with hx of seizures on sutherapeutic dose of Keppra  and very low dose of Valproic  acid(started by psychiatry), unclear compliance with Keppra  now but levels in August 2025 were essentially undetectable. Hx of hypothyroidism, hyperlipidemia, EtOh abuse, cocaine use, depression. She presents with 3 witnessed seizures at her friend's house. EMS called and she had a seizure enroute. She was given Versed  5mg . She was in afibb with rvr in the ED.  She was admitted for confusion, prolonged post ictal state and neurology consulted for further evaluation and workup.  CT head w/o contrast with no acute abnormalities.  Patient is awake and sitting up in the bed.  When I asked her why she is in the hospital, she tells me that she is sick.  She was unaware that she was at a friend's house and she had a seizure.  She tells me she does not have any friends.  I spoke with her daughter cherish over phone.  She to is not sure where patient was prior to this hospitalization.  Review of records show that her Keppra  was recently decreased to 250 mg twice a day along May 23, 2024.  I confirmed with daughter cherish and she does endorse that this change was made.  She reports the patient was experiencing some diarrhea but this is usually not a side effect of Keppra .  Daughter to feels that the diarrhea was probably secondary to metformin  and not Keppra .  Daughter reports that she sets all the medications out for patient, but despite setting the medicines up, patient will frequently miss doses.   ROS  Comprehensive ROS performed and pertinent positives documented in HPI   Past History   Past Medical History:  Diagnosis Date   Abdominal pain    Accidental  drug overdose 09/2011   Anxiety    Atrial fibrillation (HCC) 09/29/2011   converted spontaneously   Chronic back pain    Chronic knee pain    Chronic nausea    Chronic pain    COPD (chronic obstructive pulmonary disease) (HCC)    Depression    Diabetes mellitus    states her doctor took her off all DM meds in past month   Diabetic neuropathy (HCC)    Dyspnea    with exertion    GERD (gastroesophageal reflux disease)    Headache(784.0)    migraines    HTN (hypertension)    not on meds since in a year    Hyperlipidemia    Hypothyroidism    not on meds in a while    Mental disorder    Bipolar and schizophrenic   Nausea and vomiting 01/02/2023   Requires supplemental oxygen     as needed per patient    Schizophrenia (HCC)    Schizophrenia, acute (HCC) 11/13/2017   Tobacco abuse     Past Surgical History:  Procedure Laterality Date   ABDOMINAL HYSTERECTOMY     BLADDER SUSPENSION  03/04/2011   Procedure: [facility] PROCEDURE;  Surgeon: Glendia LABOR MacDiarmid;  Location: [facility] ORS;  Service: Urology;  Laterality: N/A;   BOWEL RESECTION N/A 04/18/2022   Procedure: SMALL BOWEL RESECTION;  Surgeon: Vanderbilt Ned, MD;  Location: [facility] OR;  Service: General;  Laterality: N/A;   COLONOSCOPY N/A 06/02/2024  Procedure: COLONOSCOPY;  Surgeon: Leigh Elspeth SQUIBB, MD;  Location: [facility] ENDOSCOPY;  Service: Gastroenterology;  Laterality: N/A;   CYSTOCELE REPAIR  03/04/2011   Procedure: ANTERIOR REPAIR (CYSTOCELE);  Surgeon: Glendia DELENA Elizabeth;  Location: [facility];  Service: Urology;  Laterality: N/A;   CYSTOSCOPY  03/04/2011   Procedure: CYSTOSCOPY;  Surgeon: Glendia DELENA MacDiarmid;  Location: [facility];  Service: Urology;  Laterality: N/A;   ESOPHAGOGASTRODUODENOSCOPY N/A 06/02/2024   Procedure: EGD (ESOPHAGOGASTRODUODENOSCOPY);  Surgeon: Leigh Elspeth SQUIBB, MD;  Location: [facility] ENDOSCOPY;  Service: Gastroenterology;  Laterality: N/A;   ESOPHAGOGASTRODUODENOSCOPY (EGD) WITH PROPOFOL  N/A 05/12/2017   Procedure:  ESOPHAGOGASTRODUODENOSCOPY (EGD) WITH PROPOFOL ;  Surgeon: Ethyl Lenis, MD;  Location: [facility];  Service: General;  Laterality: N/A;   GASTRIC ROUX-EN-Y N/A 03/25/2016   Procedure: LAPAROSCOPIC ROUX-EN-Y GASTRIC BYPASS WITH UPPER ENDOSCOPY;  Surgeon: Morene Olives, MD;  Location: [facility] ORS;  Service: General;  Laterality: N/A;   KNEE SURGERY     LAPAROSCOPIC ASSISTED VAGINAL HYSTERECTOMY  03/04/2011   Procedure: LAPAROSCOPIC ASSISTED VAGINAL HYSTERECTOMY;  Surgeon: Rosaline LITTIE Cobble, MD;  Location: [facility];  Service: Gynecology;  Laterality: N/A;   LAPAROTOMY N/A 04/18/2022   Procedure: EXPLORATORY LAPAROTOMY;  Surgeon: Vanderbilt Ned, MD;  Location: [facility] OR;  Service: General;  Laterality: N/A;   LAPAROTOMY N/A 04/24/2022   Procedure: BRING BACK EXPLORATORY LAPAROTOMY;  Surgeon: Sebastian Moles, MD;  Location: [facility] OR;  Service: General;  Laterality: N/A;   TOTAL HIP ARTHROPLASTY Right 08/27/2022   Procedure: TOTAL HIP ARTHROPLASTY;  Surgeon: Edna Toribio DELENA, MD;  Location: [facility] OR;  Service: Orthopedics;  Laterality: Right;    Family History: Family History  Problem Relation Age of Onset   Diabetes Mother    Heart disease Mother    Hypertension Mother    Heart attack Father        45s   Heart attack Sister        28   COPD Other    Breast cancer Neg Hx     Social History  reports that she has been smoking cigarettes. She has never used smokeless tobacco. She reports current alcohol  use. She reports current drug use. Drugs: Cocaine, Marijuana, and Crack cocaine.  Allergies[1]  Medications  Current Medications[2]  Vitals   Vitals:   06/15/24 2104 06/15/24 2128 06/15/24 2200 06/15/24 2300  BP:  (!) 147/77    Pulse:   (!) 114   Resp: 19 (!) 25    Temp:    98.9 F (37.2 C)  TempSrc:    Oral  SpO2:      Weight:      Height:        Body mass index is 24.54 kg/m.   Physical Exam   General: Laying comfortably in bed; in no acute distress.  HENT: Normal oropharynx  and mucosa. Normal external appearance of ears and nose.  Neck: Supple, no pain or tenderness  CV: No JVD. No peripheral edema.  Pulmonary: Symmetric Chest rise. Normal respiratory effort.  Abdomen: Soft to touch, non-tender.  Ext: No cyanosis, edema, or deformity  Skin: No rash. Normal palpation of skin.   Musculoskeletal: Normal digits and nails by inspection. No clubbing.   Neurologic Examination  Mental status/Cognition: Alert, oriented to self, place, month and year, poor attention, unable to do simple calculations.  Unable to tell me what day comes before Thursday or what month comes before August. Speech/language: Fluent, comprehension intact, object naming intact, repetition intact.  Cranial nerves:   CN II Pupils  equal and reactive to light, no VF deficits    CN III,IV,VI EOM intact, no gaze preference or deviation, no nystagmus    CN V normal sensation in V1, V2, and V3 segments bilaterally    CN VII no asymmetry, no nasolabial fold flattening    CN VIII normal hearing to speech    CN IX & X normal palatal elevation, no uvular deviation    CN XI 5/5 head turn and 5/5 shoulder shrug bilaterally    CN XII midline tongue protrusion    Motor:  Muscle bulk: normal, tone normal, pronator drift none tremor none Mvmt Root Nerve  Muscle Right Left Comments  SA C5/6 Ax Deltoid 5 5   EF C5/6 Mc Biceps 5 5   EE C6/7/8 Rad Triceps 5 5   WF C6/7 Med FCR     WE C7/8 PIN ECU     F Ab C8/T1 U ADM/FDI 5 5   HF L1/2/3 Fem Illopsoas 5 5   KE L2/3/4 Fem Quad 5 5   DF L4/5 D Peron Tib Ant 5 5   PF S1/2 Tibial Grc/Sol 5 5    Sensation:  Light touch Intact throughout   Pin prick    Temperature    Vibration   Proprioception    Coordination/Complex Motor:  - Finger to Nose intact bilaterally - Heel to shin intact bilaterally - Rapid alternating movement are slowed throughout. - Gait: Deferred for patient safety. Labs/Imaging/Neurodiagnostic studies   CBC:  Recent Labs  Lab  06/10/24 0910 06/15/24 1340 06/15/24 2238  WBC 5.1 10.7* 7.9  NEUTROABS 3.0 9.7*  --   HGB 9.9* 11.2* 9.7*  HCT 32.8* 36.1 31.9*  MCV 78.8* 76.8* 78.0*  PLT 287 264 140*   Basic Metabolic Panel:  Lab Results  Component Value Date   NA 140 06/15/2024   K 4.5 06/15/2024   CO2 22 06/15/2024   GLUCOSE 174 (H) 06/15/2024   BUN 9 06/15/2024   CREATININE 0.65 06/15/2024   CALCIUM  8.6 (L) 06/15/2024   GFRNONAA >60 06/15/2024   GFRAA >60 08/03/2018   Lipid Panel:  Lab Results  Component Value Date   LDLCALC 59 02/05/2024   HgbA1c:  Lab Results  Component Value Date   HGBA1C 6.2 (H) 06/08/2024   Urine Drug Screen:     Component Value Date/Time   LABOPIA NEGATIVE 06/15/2024 1340   COCAINSCRNUR NEGATIVE 06/15/2024 1340   COCAINSCRNUR Positive (A) 10/10/2020 1319   LABBENZ POSITIVE (A) 06/15/2024 1340   AMPHETMU NEGATIVE 06/15/2024 1340   THCU NEGATIVE 06/15/2024 1340   LABBARB NEGATIVE 06/15/2024 1340    Alcohol  Level     Component Value Date/Time   ETH <15 06/15/2024 1340   INR  Lab Results  Component Value Date   INR 0.9 06/08/2024   APTT  Lab Results  Component Value Date   APTT 25 12/16/2022   AED levels:  Lab Results  Component Value Date   LEVETIRACETA 14.2 11/05/2023    CT Head without contrast(Personally reviewed): CTH was negative for a large hypodensity concerning for a large territory infarct or hyperdensity concerning for an ICH  ASSESSMENT   [name] is a 58 y.o. female with hx of seizures and noncompliance with seizure meds and missing several doses. Her Keppra  dose was cut down to 250mg  BID on 05/23/24 by outpatient team. Keppra  250mg  BID is a subtherapeutic dose and likely no effective.  I spoke with her daughter [name], recommend discharging at 500mg  BID. Daughter  endorses signfiicantly difficulties in getting patient to take her medications. This is not because of side effects or interolerance, just that she would not take  it.  On my evaluation, [name] is sitting in the bed, oriented x 3, follows commands in all extremities and appears to have baseline difficulties with executive dysfunction and per daughter, this is normal baseline for her.  RECOMMENDATIONS  - increase Keppra  to 500mg  BID - no driving for 6 months. Spoke with daughter who tells me that patient does not have a car and daughter has her drivers license with her. Also discussed seizure precautions with patient and daugher. - neurology will signoff. Please feel free to contact us  with any questions or concerns. ______________________________________________________________________  Signed, Tatyanna Cronk, MD [facility]     [1]  Allergies Allergen Reactions   Iron  Dextran Shortness Of Breath and Anxiety   Aspirin  Nausea And Vomiting and Other (See Comments)    Ok to take tylenol  or ibuprofen     Metformin  Hcl Er Diarrhea and Other (See Comments)    Gi issues with the 750 mg ER version   Penicillins Other (See Comments)    Unknown reaction from childhood: family would like this to remain as an allergy.  Tolerated cephalosporins and zosyn   [2]  Current Facility-Administered Medications:    albuterol  (PROVENTIL ) (2.5 MG/3ML) 0.083% nebulizer solution 2.5 mg, 2.5 mg, Nebulization, Q4H PRN, Marten Peat M, RPH   [START ON 06/16/2024] divalproex  (DEPAKOTE  ER) 24 hr tablet 250 mg, 250 mg, Oral, Daily, Garba, Mohammad L, MD   enoxaparin  (LOVENOX ) injection 40 mg, 40 mg, Subcutaneous, Q24H, Garba, Mohammad L, MD, 40 mg at 06/15/24 2212   [START ON 06/16/2024] folic acid  (FOLVITE ) tablet 1 mg, 1 mg, Oral, Daily, Garba, Mohammad L, MD   insulin  aspart (novoLOG ) injection 0-5 Units, 0-5 Units, Subcutaneous, QHS, Garba, Mohammad L, MD   [START ON 06/16/2024] insulin  aspart (novoLOG ) injection 0-9 Units, 0-9 Units, Subcutaneous, TID WC, Garba, Mohammad L, MD   lactated ringers  infusion, , Intravenous, Continuous, Sim Emery LITTIE, MD, Last Rate: 100 mL/hr at 06/15/24 2208, New Bag at 06/15/24 2208   [START ON 06/16/2024] levETIRAcetam  (KEPPRA ) undiluted injection 500 mg, 500 mg, Intravenous, Q12H, Garba, Mohammad L, MD   LORazepam  (ATIVAN ) tablet 1-4 mg, 1-4 mg, Oral, Q1H PRN **OR** LORazepam  (ATIVAN ) injection 1-4 mg, 1-4 mg, Intravenous, Q1H PRN, Sim Emery L, MD, 2 mg at 06/15/24 2255   [START ON 06/16/2024] multivitamin with minerals tablet 1 tablet, 1 tablet, Oral, Daily, Garba, Mohammad L, MD   ondansetron  (ZOFRAN ) tablet 4 mg, 4 mg, Oral, Q6H PRN **OR** ondansetron  (ZOFRAN ) injection 4 mg, 4 mg, Intravenous, Q6H PRN, Sim, Emery LITTIE, MD   [START ON 06/16/2024] pantoprazole  (PROTONIX ) EC tablet 40 mg, 40 mg, Oral, Daily, Sim, Mohammad L, MD   [START ON 06/16/2024] thiamine  (VITAMIN B1) tablet 100 mg, 100 mg, Oral, Daily **OR** [START ON 06/16/2024] thiamine  (VITAMIN B1) injection 100 mg, 100 mg, Intravenous, Daily, Sim Emery LITTIE, MD

## 2024-06-15 NOTE — ED Notes (Signed)
 Pt ambulatory to the restroom with 2 staff assist. Pt had a BM with her urine specimen- unable to obtain urine to send to lab. Will try again later

## 2024-06-15 NOTE — H&P (Signed)
 " History and Physical    Patient: [name] FMW:[id number] DOB: February 20, 1966 DOA: 06/15/2024 DOS: the patient was seen and examined on 06/15/2024 PCP: [name], NP  Patient coming from: Home  Chief Complaint:  Chief Complaint  Patient presents with   Altered Mental Status   Seizures   HPI: [name] is a 58 y.o. female with medical history significant of patient with history of seizure disorder, polysubstance abuse including alcohol , cocaine, depression, GERD, hypothyroidism, hyperlipidemia, COPD on 2 to 3 L of oxygen  at night at home, bipolar disorder, history of Roux-en-Y surgery chronic pain, atrial fibrillation type 2 diabetes who was at a friend's house when he had 3 witnessed seizures.  EMS arrived and he had 1 episode of seizure also in their presence.  They gave him 5 mg of midazolam  and brought him to the ER.  In the ER he was noted to have A-fib with RVR initially, patient remained postictal with confusion.  Neurology consulted and recommended EEG and they will follow.  He is arousable.  The dose of his Keppra  was increased from 250 mg of 500 mg.  He was recently discharged from the hospital on the 19th when he was admitted and was intractable nausea and vomiting from alcohol  induced gastritis.  No evidence of alcohol  withdrawal at the time.  At this point patient has been admitted with seizure disorder probably due to his known seizures or due to alcohol  withdrawal.  Review of Systems: As mentioned in the history of present illness. All other systems reviewed and are negative. Past Medical History:  Diagnosis Date   Abdominal pain    Accidental drug overdose 09/2011   Anxiety    Atrial fibrillation (HCC) 09/29/2011   converted spontaneously   Chronic back pain    Chronic knee pain    Chronic nausea    Chronic pain    COPD (chronic obstructive pulmonary disease) (HCC)    Depression    Diabetes mellitus    states her doctor took her off all DM meds in  past month   Diabetic neuropathy (HCC)    Dyspnea    with exertion    GERD (gastroesophageal reflux disease)    Headache(784.0)    migraines    HTN (hypertension)    not on meds since in a year    Hyperlipidemia    Hypothyroidism    not on meds in a while    Mental disorder    Bipolar and schizophrenic   Nausea and vomiting 01/02/2023   Requires supplemental oxygen     as needed per patient    Schizophrenia (HCC)    Schizophrenia, acute (HCC) 11/13/2017   Tobacco abuse    Past Surgical History:  Procedure Laterality Date   ABDOMINAL HYSTERECTOMY     BLADDER SUSPENSION  03/04/2011   Procedure: [facility] PROCEDURE;  Surgeon: Glendia LABOR MacDiarmid;  Location: [facility] ORS;  Service: Urology;  Laterality: N/A;   BOWEL RESECTION N/A 04/18/2022   Procedure: SMALL BOWEL RESECTION;  Surgeon: Vanderbilt Ned, MD;  Location: [facility] OR;  Service: General;  Laterality: N/A;   COLONOSCOPY N/A 06/02/2024   Procedure: COLONOSCOPY;  Surgeon: Leigh Elspeth SQUIBB, MD;  Location: [facility] ENDOSCOPY;  Service: Gastroenterology;  Laterality: N/A;   CYSTOCELE REPAIR  03/04/2011   Procedure: ANTERIOR REPAIR (CYSTOCELE);  Surgeon: Glendia LABOR Elizabeth;  Location: [facility] ORS;  Service: Urology;  Laterality: N/A;   CYSTOSCOPY  03/04/2011   Procedure: CYSTOSCOPY;  Surgeon: Glendia LABOR MacDiarmid;  Location: [facility]  ORS;  Service: Urology;  Laterality: N/A;   ESOPHAGOGASTRODUODENOSCOPY N/A 06/02/2024   Procedure: EGD (ESOPHAGOGASTRODUODENOSCOPY);  Surgeon: Leigh Elspeth SQUIBB, MD;  Location: [facility] ENDOSCOPY;  Service: Gastroenterology;  Laterality: N/A;   ESOPHAGOGASTRODUODENOSCOPY (EGD) WITH PROPOFOL  N/A 05/12/2017   Procedure: ESOPHAGOGASTRODUODENOSCOPY (EGD) WITH PROPOFOL ;  Surgeon: Ethyl Lenis, MD;  Location: [facility] ENDOSCOPY;  Service: General;  Laterality: N/A;   GASTRIC ROUX-EN-Y N/A 03/25/2016   Procedure: LAPAROSCOPIC ROUX-EN-Y GASTRIC BYPASS WITH UPPER ENDOSCOPY;  Surgeon: Morene Olives, MD;  Location: [facility] ORS;  Service: General;   Laterality: N/A;   KNEE SURGERY     LAPAROSCOPIC ASSISTED VAGINAL HYSTERECTOMY  03/04/2011   Procedure: LAPAROSCOPIC ASSISTED VAGINAL HYSTERECTOMY;  Surgeon: Rosaline LITTIE Cobble, MD;  Location: [facility] ORS;  Service: Gynecology;  Laterality: N/A;   LAPAROTOMY N/A 04/18/2022   Procedure: EXPLORATORY LAPAROTOMY;  Surgeon: Vanderbilt Ned, MD;  Location: [facility] OR;  Service: General;  Laterality: N/A;   LAPAROTOMY N/A 04/24/2022   Procedure: BRING BACK EXPLORATORY LAPAROTOMY;  Surgeon: Sebastian Moles, MD;  Location: [facility] OR;  Service: General;  Laterality: N/A;   TOTAL HIP ARTHROPLASTY Right 08/27/2022   Procedure: TOTAL HIP ARTHROPLASTY;  Surgeon: Edna Toribio LABOR, MD;  Location: [facility] OR;  Service: Orthopedics;  Laterality: Right;   Social History:  reports that she has been smoking cigarettes. She has never used smokeless tobacco. She reports current alcohol  use. She reports current drug use. Drugs: Cocaine, Marijuana, and Crack cocaine.  Allergies[1]  Family History  Problem Relation Age of Onset   Diabetes Mother    Heart disease Mother    Hypertension Mother    Heart attack Father        37s   Heart attack Sister        54   COPD Other    Breast cancer Neg Hx     Prior to Admission medications  Medication Sig Start Date End Date Taking? Authorizing Provider  albuterol  (VENTOLIN  HFA) 108 (90 Base) MCG/ACT inhaler Inhale 2 puffs into the lungs every 4 (four) hours as needed for wheezing or shortness of breath. 06/10/24   Ghimire, Kuber, MD  Blood Glucose Monitoring Suppl (BLOOD GLUCOSE MONITOR SYSTEM) w/Device KIT Use up to four times daily as directed. 06/10/24   Raenelle Coria, MD  divalproex  (DEPAKOTE  ER) 250 MG 24 hr tablet Take 1 tablet (250 mg total) by mouth daily. 05/28/24   Simon Lavonia SAILOR, MD  folic acid  (FOLVITE ) 1 MG tablet Take 1 tablet (1 mg total) by mouth daily. 06/04/24   Rashid, Farhan, MD  Glucose Blood (BLOOD GLUCOSE TEST STRIPS) STRP Use up to four times daily as directed.  06/10/24   Ghimire, Kuber, MD  Ibuprofen  200 MG CAPS Take 200-400 mg by mouth every 6 (six) hours as needed (for pain).    [provider]  Lancet Device MISC Use up to four times daily as directed. (FOR ICD-10 E10.9, E11.9). 06/10/24   Raenelle Coria, MD  Lancets MISC Use up to four times daily as directed 06/10/24   Raenelle Coria, MD  levETIRAcetam  (KEPPRA ) 250 MG tablet Take 1 tablet (250 mg total) by mouth 2 (two) times daily. 05/23/24   Medina-Vargas, Monina C, NP  lidocaine  (LIDODERM ) 5 % Place 1 patch onto the skin daily as needed (for pain- Remove & Discard patch within 12 hours or as directed by MD).    [provider]  metFORMIN  (GLUCOPHAGE ) 500 MG tablet Take 500 mg by mouth daily with breakfast.    [provider]  ondansetron  (ZOFRAN -ODT) 4 MG disintegrating tablet Dissolve 1 tablet (4 mg total) by mouth every 8 (eight) hours as needed for nausea or vomiting. Patient taking differently: Take 4 mg by mouth every 8 (eight) hours as needed for nausea or vomiting (dissolve orally). 05/25/24   Tegeler, Lonni PARAS, MD  oxyCODONE -acetaminophen  (PERCOCET/ROXICET) 5-325 MG tablet Take 1 tablet by mouth every 4 (four) hours as needed for severe pain (pain score 7-10). Patient not taking: Reported on 06/09/2024 05/25/24   Tegeler, Lonni PARAS, MD  OXYGEN  Inhale 4-5 L/min into the lungs See admin instructions. Inhale 4-5 L/min into the lungs at bedtime and during the day as needed for shortness of breath    [provider]  pantoprazole  (PROTONIX ) 40 MG tablet Take 1 tablet (40 mg total) by mouth daily. 06/10/24   Raenelle Coria, MD  thiamine  (VITAMIN B1) 100 MG tablet Take 1 tablet (100 mg total) by mouth daily. Patient not taking: Reported on 06/09/2024 05/28/24   Simon Lavonia SAILOR, MD  traZODone  (DESYREL ) 50 MG tablet Take 1 tablet (50 mg total) by mouth at bedtime as needed for sleep. 06/03/24   Dino Antu, MD    Physical Exam: Vitals:   06/15/24 1500  06/15/24 1753 06/15/24 2100 06/15/24 2104  BP: (!) 122/97 (!) 141/75    Pulse: (!) 120 (!) 108 (!) 102   Resp: (!) 29 (!) 22  19  Temp:      TempSrc:      SpO2: 100% 96%    Weight:      Height:       Constitutional: Acutely ill looking, mild distress, drowsy NAD, calm, comfortable Eyes: PERRL, lids and conjunctivae normal ENMT: Mucous membranes are dry. Posterior pharynx clear of any exudate or lesions.Normal dentition.  Neck: normal, supple, no masses, no thyromegaly Respiratory: clear to auscultation bilaterally, no wheezing, no crackles. Normal respiratory effort. No accessory muscle use.  Cardiovascular: Tachycardia with irregular rhythm, no murmurs / rubs / gallops. No extremity edema. 2+ pedal pulses. No carotid bruits.  Abdomen: no tenderness, no masses palpated. No hepatosplenomegaly. Bowel sounds positive.  Musculoskeletal: Good range of motion, no joint swelling or tenderness, Skin: no rashes, lesions, ulcers. No induration Neurologic: CN 2-12 grossly intact. Sensation intact, DTR normal. Strength 5/5 in all 4.  Psychiatric: Drowsy, awake,  Data Reviewed:  Temperature 99.5, blood pressure 150/89, pulse 128, rest of 29, oxygen  sat 96% on room air.  White count is 10.7, hemoglobin 11.2 and calcium  8.6.  Glucose 174 lactic acid 3.6 drug screen is positive for benzodiazepine urinalysis is negative.  EKG showed A-fib with RVR.  Chest x-ray showed low lung volumes but no acute findings CT chest without contrast is showing no acute findings.  Head CT without contrast showed no acute findings  Assessment and Plan:  #1 breakthrough seizures: Patient meets all Keppra .  He has also been on Depakote .  Will admit the patient with seizure precaution.  Neurology consulted.  EEG to be done.  The dose of Keppra  increased to 500 mg twice daily.  Loaded with Keppra  already.  Continue to monitor and follow neurology recommendations.  #2 polysubstance abuse: Patient has history of alcoholism,  cocaine and other substances.  Urine drug screen however is positive only for benzodiazepines.  Patient also denied recent alcohol  use.  Continue to monitor.  #3 COPD: No acute exacerbation.  Continue to monitor  #4 GERD: Continue PPIs  #5 schizoaffective disorder: Counseling provided.  Continue monitoring.  #6 tobacco abuse: Continue to work  cessation counseling.    Advance Care Planning:   Code Status: Prior full code  Consults: Neurology  Family Communication: No family at bedside  Severity of Illness: The appropriate patient status for this patient is INPATIENT. Inpatient status is judged to be reasonable and necessary in order to provide the required intensity of service to ensure the patient's safety. The patient's presenting symptoms, physical exam findings, and initial radiographic and laboratory data in the context of their chronic comorbidities is felt to place them at high risk for further clinical deterioration. Furthermore, it is not anticipated that the patient will be medically stable for discharge from the hospital within 2 midnights of admission.   * I certify that at the point of admission it is my clinical judgment that the patient will require inpatient hospital care spanning beyond 2 midnights from the point of admission due to high intensity of service, high risk for further deterioration and high frequency of surveillance required.*  AuthorBETHA SIM KNOLL, MD 06/15/2024 9:23 PM  For on call review [email or web address].     [1]  Allergies Allergen Reactions   Iron  Dextran Shortness Of Breath and Anxiety   Aspirin  Nausea And Vomiting and Other (See Comments)    Ok to take tylenol  or ibuprofen     Metformin  Hcl Er Diarrhea and Other (See Comments)    Gi issues with the 750 mg ER version   Penicillins Other (See Comments)    Unknown reaction from childhood: family would like this to remain as an allergy.  Tolerated cephalosporins and zosyn    "

## 2024-06-15 NOTE — ED Provider Notes (Signed)
 " [place] EMERGENCY DEPARTMENT AT [facility] Provider Note   CSN: [id number] Arrival date & time: 06/15/24  1320     Patient presents with: Altered Mental Status and Seizures   [name] is a 58 y.o. female presenting from home by EMS with concern for multiple seizures and confusion.  EMS reports that the patient was staying with her friend overnight and the patient reportedly 3 seizures before the morning.  Patient picked up by EMS and reportedly had another episode of generalized shaking and lipsmacking, for which she got 5 mg of IV Versed .  Seizure-like activity abated.  Patient had been complaining of nausea, vomiting, and abdominal pain prior to that.  I did review her medical charts.  She was most recently evaluated by neurology in March 2024 for seizure-like activity.  She had right temporal sharps on EEG monitoring and was discharged on zonisamide  200 mg at bedtime.  She has noted have a history of chronic pain, high blood pressure, reflux, COPD, polysubstance use, paroxysmal A-fib, heavy alcohol  consumption and in the past crack cocaine use, diabetes.  I reviewed external records.  Patient was also discharged from the hospital approximately 5 days ago after admission for intractable nausea vomiting and retching.  CT imaging was unremarkable.  She was started on IV fluids and Protonix  and given nausea medication as well as Ativan  for alcohol  withdrawal.  Hospitalist reports that the patient did not show evidence of delirium or seizure while in the hospital.  She was noted to be on Depakote  250 mg daily at that time, extended release, and zonisamide  was not listed on her medication list.  {Add pertinent medical, surgical, social history, OB history to HPI:32947} HPI     Prior to Admission medications  Medication Sig Start Date End Date Taking? Authorizing Provider  albuterol  (VENTOLIN  HFA) 108 (90 Base) MCG/ACT inhaler Inhale 2 puffs into the lungs every 4 (four)  hours as needed for wheezing or shortness of breath. 06/10/24   Ghimire, Kuber, MD  Blood Glucose Monitoring Suppl (BLOOD GLUCOSE MONITOR SYSTEM) w/Device KIT Use up to four times daily as directed. 06/10/24   Raenelle Coria, MD  divalproex  (DEPAKOTE  ER) 250 MG 24 hr tablet Take 1 tablet (250 mg total) by mouth daily. 05/28/24   Simon Lavonia SAILOR, MD  folic acid  (FOLVITE ) 1 MG tablet Take 1 tablet (1 mg total) by mouth daily. 06/04/24   Rashid, Farhan, MD  Glucose Blood (BLOOD GLUCOSE TEST STRIPS) STRP Use up to four times daily as directed. 06/10/24   Raenelle Coria, MD  Ibuprofen  200 MG CAPS Take 200-400 mg by mouth every 6 (six) hours as needed (for pain).    [provider]  Lancet Device MISC Use up to four times daily as directed. (FOR ICD-10 E10.9, E11.9). 06/10/24   Raenelle Coria, MD  Lancets MISC Use up to four times daily as directed 06/10/24   Raenelle Coria, MD  levETIRAcetam  (KEPPRA ) 250 MG tablet Take 1 tablet (250 mg total) by mouth 2 (two) times daily. 05/23/24   Medina-Vargas, Monina C, NP  lidocaine  (LIDODERM ) 5 % Place 1 patch onto the skin daily as needed (for pain- Remove & Discard patch within 12 hours or as directed by MD).    [provider]  metFORMIN  (GLUCOPHAGE ) 500 MG tablet Take 500 mg by mouth daily with breakfast.    [provider]  ondansetron  (ZOFRAN -ODT) 4 MG disintegrating tablet Dissolve 1 tablet (4 mg total) by mouth every 8 (eight)  hours as needed for nausea or vomiting. Patient taking differently: Take 4 mg by mouth every 8 (eight) hours as needed for nausea or vomiting (dissolve orally). 05/25/24   Tegeler, Lonni PARAS, MD  oxyCODONE -acetaminophen  (PERCOCET/ROXICET) 5-325 MG tablet Take 1 tablet by mouth every 4 (four) hours as needed for severe pain (pain score 7-10). Patient not taking: Reported on 06/09/2024 05/25/24   Tegeler, Lonni PARAS, MD  OXYGEN  Inhale 4-5 L/min into the lungs See admin instructions. Inhale 4-5 L/min into the  lungs at bedtime and during the day as needed for shortness of breath    [provider]  pantoprazole  (PROTONIX ) 40 MG tablet Take 1 tablet (40 mg total) by mouth daily. 06/10/24   Raenelle Coria, MD  thiamine  (VITAMIN B1) 100 MG tablet Take 1 tablet (100 mg total) by mouth daily. Patient not taking: Reported on 06/09/2024 05/28/24   Simon Lavonia SAILOR, MD  traZODone  (DESYREL ) 50 MG tablet Take 1 tablet (50 mg total) by mouth at bedtime as needed for sleep. 06/03/24   Rashid, Farhan, MD    Allergies: Iron  dextran, Aspirin , Metformin  hcl er, and Penicillins    Review of Systems  Updated Vital Signs BP (!) 122/97   Pulse (!) 120   Temp 99.5 F (37.5 C) (Oral)   Resp (!) 29   Ht 5' 1 (1.549 m)   Wt 58.9 kg   LMP 01/08/2011   SpO2 100%   BMI 24.54 kg/m   Physical Exam Constitutional:      General: She is not in acute distress.    Comments: Somnolent, snoring, awakens to voice and stimulation  HENT:     Head: Normocephalic and atraumatic.  Eyes:     Conjunctiva/sclera: Conjunctivae normal.     Pupils: Pupils are equal, round, and reactive to light.  Cardiovascular:     Rate and Rhythm: Normal rate and regular rhythm.  Pulmonary:     Effort: Pulmonary effort is normal. No respiratory distress.  Abdominal:     General: There is no distension.     Tenderness: There is no abdominal tenderness.  Skin:    General: Skin is warm and dry.  Neurological:     General: No focal deficit present.     Mental Status: She is alert. Mental status is at baseline.     (all labs ordered are listed, but only abnormal results are displayed) Labs Reviewed  BASIC METABOLIC PANEL WITH GFR - Abnormal; Notable for the following components:      Result Value   Glucose, Bld 174 (*)    Calcium  8.6 (*)    Anion gap 16 (*)    All other components within normal limits  CBC WITH DIFFERENTIAL/PLATELET - Abnormal; Notable for the following components:   WBC 10.7 (*)    Hemoglobin 11.2 (*)     MCV 76.8 (*)    MCH 23.8 (*)    RDW 25.7 (*)    Neutro Abs 9.7 (*)    Lymphs Abs 0.4 (*)    All other components within normal limits  I-STAT CG4 LACTIC ACID, ED - Abnormal; Notable for the following components:   Lactic Acid, Venous 3.6 (*)    All other components within normal limits  CBG MONITORING, ED - Abnormal; Notable for the following components:   Glucose-Capillary 174 (*)    All other components within normal limits  RESP PANEL BY RT-PCR (RSV, FLU A&B, COVID)  RVPGX2  PRO BRAIN NATRIURETIC PEPTIDE  LIPASE, BLOOD  ETHANOL  URINALYSIS, ROUTINE W REFLEX MICROSCOPIC  VALPROIC  ACID LEVEL  URINE DRUG SCREEN  PATHOLOGIST SMEAR REVIEW  LEVETIRACETAM  LEVEL  I-STAT CG4 LACTIC ACID, ED    EKG: EKG Interpretation Date/Time:  Wednesday June 15 2024 13:32:25 EST Ventricular Rate:  123 PR Interval:  147 QRS Duration:  91 QT Interval:  306 QTC Calculation: 438 R Axis:   28  Text Interpretation: Sinus tachycardia Atrial premature complex Probable left atrial enlargement Confirmed by Cottie Cough [phone]) on 06/15/2024 1:34:02 PM  Radiology: CT Head Wo Contrast Result Date: 06/15/2024 EXAM: CT HEAD WITHOUT CONTRAST 06/15/2024 02:15:00 PM TECHNIQUE: CT of the head was performed without the administration of intravenous contrast. Automated exposure control, iterative reconstruction, and/or weight based adjustment of the mA/kV was utilized to reduce the radiation dose to as low as reasonably achievable. COMPARISON: 05/25/2024 CLINICAL HISTORY: Mental status change, unknown cause FINDINGS: BRAIN AND VENTRICLES: No acute hemorrhage. No evidence of acute infarct. No hydrocephalus. No extra-axial collection. No mass effect or midline shift. ORBITS: No acute abnormality. SINUSES: No acute abnormality. SOFT TISSUES AND SKULL: No acute soft tissue abnormality. No skull fracture. IMPRESSION: 1. No acute intracranial abnormality. Electronically signed by: Rogelia Myers MD 06/15/2024 02:57  PM EST RP Workstation: [id number]   CT Chest Wo Contrast Result Date: 06/15/2024 EXAM: CT CHEST WITHOUT CONTRAST 06/15/2024 02:15:00 PM TECHNIQUE: CT of the chest was performed without the administration of intravenous contrast. Multiplanar reformatted images are provided for review. Automated exposure control, iterative reconstruction, and/or weight based adjustment of the mA/kV was utilized to reduce the radiation dose to as low as reasonably achievable. COMPARISON: Prior CT abdomen dated 06/08/2024. CLINICAL HISTORY: Pneumonia, complication suspected, xray done. FINDINGS: MEDIASTINUM: scattered calcified aortic plaque. Right coronary calcifications. The central airways are clear. LYMPH NODES: No mediastinal, hilar or axillary lymphadenopathy. LUNGS AND PLEURA: Minimal linear atelectasis or scarring in the lung bases. No pleural effusion or pneumothorax. SOFT TISSUES/BONES: Chronic vertebral compression deformities of T1, T4, T6, T9. Superior plate compression deformity of L1, present on prior CT abdomen 06/08/2024. UPPER ABDOMEN: Limited images of the upper abdomen demonstrate post gastric bypass surgery. IMPRESSION: 1. No acute findings. Electronically signed by: Katheleen Faes MD 06/15/2024 02:47 PM EST RP Workstation: [id number]   DG Chest Portable 1 View Result Date: 06/15/2024 EXAM: 1 VIEW(S) XRAY OF THE CHEST 06/15/2024 01:49:00 PM COMPARISON: 05/28/2024 CLINICAL HISTORY: aspiration evaluation FINDINGS: LUNGS AND PLEURA: Low lung volumes. No focal consolidations. No pleural effusion. No pneumothorax. HEART AND MEDIASTINUM: No acute abnormality of the cardiac and mediastinal silhouettes. BONES AND SOFT TISSUES: No acute osseous abnormality. IMPRESSION: 1. Low lung volumes. No acute airspace disease. Electronically signed by: Michaeline Blanch MD 06/15/2024 01:58 PM EST RP Workstation: [id number]    {Document cardiac monitor, telemetry assessment procedure when appropriate:32947} Procedures    Medications Ordered in the ED  levETIRAcetam  (KEPPRA ) undiluted injection 2,000 mg (has no administration in time range)  sodium chloride  0.9 % bolus 1,000 mL (1,000 mLs Intravenous New Bag/Given 06/15/24 1348)    Clinical Course as of 06/15/24 1532  Wed Jun 15, 2024  1454 Paged neurology [MT]  1508 Dr Merrianne neurology recommending keppra  IV 2g loading dose and increasing home keppra  to 500 mg BID at home [MT]  1529 Confirmed with pharmacy patient most recently filled Keppra  and Depakote  as possible AED's (although both doses seem subtherapeutic as AED's).  Not on zonargan at this time.  Patient herself is too confused or postictal to tell me her meds, stating I don't know or whether  she took them [MT]    Clinical Course User Index [MT] Towana Stenglein, Donnice PARAS, MD   {Click here for ABCD2, HEART and other calculators REFRESH Note before signing:1}                              Medical Decision Making Amount and/or Complexity of Data Reviewed Labs: ordered. Radiology: ordered.   This patient presents to the Emergency Department with complaint of altered mental status.  This involves an extensive number of treatment options, and is a complaint that carries with it a high risk of complications and morbidity.  The differential diagnosis includes hypoglycemia vs metabolic encephalopathy vs infection (including cystitis) vs ICH vs stroke vs polypharmacy vs other  I ordered, reviewed, and interpreted labs, including *** I ordered medication *** for *** I ordered imaging studies which included ***  I independently visualized and interpreted imaging which showed *** and the monitor tracing which showed *** Additional history was obtained from *** Previous records obtained and reviewed showing *** I personally reviewed the patients ECG which showed sinus tachycardia rhythm with no acute ischemic findings  I consulted neurology and discussed lab and imaging findings  - see ed course  - plan for  IV loading meds, obs for improving mental status, UA/UDS for triggers for seizure (possible noncompliance); no stat EEG indicated at this time; if pt back to baseline and feeling better could go home when encouragement to take home meds  After the interventions stated above, I reevaluated the patient and found some improvement of mental status, still groggy and confused, possibly post-ictal.      {Document critical care time when appropriate  Document review of labs and clinical decision tools ie CHADS2VASC2, etc  Document your independent review of radiology images and any outside records  Document your discussion with family members, caretakers and with consultants  Document social determinants of health affecting pt's care  Document your decision making why or why not admission, treatments were needed:32947:::1}   Final diagnoses:  None    ED Discharge Orders     None        "

## 2024-06-15 NOTE — ED Notes (Signed)
 Floor called to let them know that [name] is coming up.  Also advised that pt has been seen several times lately in the ED under the influence of Alcohol  which might answer to some of her seizures.

## 2024-06-15 NOTE — ED Triage Notes (Signed)
 Pt bib ems from [facility]. Pt had 3 witnessed seizures by friends, 1 witnessed seizure by EMS, given 5mg  midazolam  IM at 1310. Pt was initially in a fib RVR at 170-220, pt converted to ST at 125 after seizure.  Pt arrives alert to voice but confused.

## 2024-06-16 ENCOUNTER — Inpatient Hospital Stay (HOSPITAL_COMMUNITY)

## 2024-06-16 DIAGNOSIS — R569 Unspecified convulsions: Secondary | ICD-10-CM | POA: Diagnosis not present

## 2024-06-16 LAB — COMPREHENSIVE METABOLIC PANEL WITH GFR
ALT: 32 U/L (ref 0–44)
AST: 26 U/L (ref 15–41)
Albumin: 3.9 g/dL (ref 3.5–5.0)
Alkaline Phosphatase: 132 U/L — ABNORMAL HIGH (ref 38–126)
Anion gap: 14 (ref 5–15)
BUN: <5 mg/dL — ABNORMAL LOW (ref 6–20)
CO2: 22 mmol/L (ref 22–32)
Calcium: 8.1 mg/dL — ABNORMAL LOW (ref 8.9–10.3)
Chloride: 102 mmol/L (ref 98–111)
Creatinine, Ser: 0.61 mg/dL (ref 0.44–1.00)
GFR, Estimated: >60 mL/min
Glucose, Bld: 115 mg/dL — ABNORMAL HIGH (ref 70–99)
Potassium: 4 mmol/L (ref 3.5–5.1)
Sodium: 137 mmol/L (ref 135–145)
Total Bilirubin: 1.3 mg/dL — ABNORMAL HIGH (ref 0.0–1.2)
Total Protein: 6.8 g/dL (ref 6.5–8.1)

## 2024-06-16 LAB — CBC
HCT: 35.9 % — ABNORMAL LOW (ref 36.0–46.0)
Hemoglobin: 10.8 g/dL — ABNORMAL LOW (ref 12.0–15.0)
MCH: 23.8 pg — ABNORMAL LOW (ref 26.0–34.0)
MCHC: 30.1 g/dL (ref 30.0–36.0)
MCV: 79.2 fL — ABNORMAL LOW (ref 80.0–100.0)
Platelets: 216 K/uL (ref 150–400)
RBC: 4.53 MIL/uL (ref 3.87–5.11)
RDW: 25.7 % — ABNORMAL HIGH (ref 11.5–15.5)
WBC: 7.2 K/uL (ref 4.0–10.5)
nRBC: 0 % (ref 0.0–0.2)

## 2024-06-16 LAB — GLUCOSE, CAPILLARY
Glucose-Capillary: 115 mg/dL — ABNORMAL HIGH (ref 70–99)
Glucose-Capillary: 158 mg/dL — ABNORMAL HIGH (ref 70–99)

## 2024-06-16 MED ORDER — SODIUM CHLORIDE 0.9 % IV SOLN: 500.0000 mg | Freq: Two times a day (BID) | INTRAVENOUS | 0 refills | Status: DC

## 2024-06-16 MED ORDER — ORAL CARE MOUTH RINSE: 15.0000 mL | OROMUCOSAL | Status: DC | PRN

## 2024-06-16 MED ORDER — LEVETIRACETAM 500 MG PO TABS: 500.0000 mg | ORAL_TABLET | Freq: Two times a day (BID) | ORAL | 0 refills | Status: DC

## 2024-06-16 NOTE — Discharge Instructions (Signed)
-  Per [place] DMV statutes, patients with seizures are not allowed to drive until they have been seizure-free for six months. Use caution when using heavy equipment or power tools. Avoid working on ladders or at heights. Take showers instead of baths. Ensure the water temperature is not too high on the home water heater. Do not go swimming alone. Do not lock yourself in a room alone (i.e. bathroom). When caring for infants or small children, sit down when holding, feeding, or changing them to minimize risk of injury to the child in the event you have a seizure. Maintain good sleep hygiene. Avoid alcohol.  If patient has another seizure, call 911 and bring them back to the ED if: A.  The seizure lasts longer than 5 minutes.      B.  The patient doesn't wake shortly after the seizure or has new problems such as difficulty seeing, speaking or moving following the seizure C.  The patient was injured during the seizure D.  The patient has a temperature over 102 F (39C) E.  The patient vomited during the seizure and now is having trouble breathing    During the Seizure   - First, ensure adequate ventilation and place patients on the floor on their left side  Loosen clothing around the neck and ensure the airway is patent. If the patient is clenching the teeth, do not force the mouth open with any object as this can cause severe damage - Remove all items from the surrounding that can be hazardous. The patient may be oblivious to what's happening and may not even know what he or she is doing. If the patient is confused and wandering, either gently guide him/her away and block access to outside areas - Reassure the individual and be comforting - Call 911. In most cases, the seizure ends before EMS arrives. However, there are cases when seizures may last over 3 to 5 minutes. Or the individual may have developed breathing difficulties or severe injuries. If a pregnant patient or a person with diabetes  develops a seizure, it is prudent to call an ambulance. - Finally, if the patient does not regain full consciousness, then call EMS. Most patients will remain confused for about 45 to 90 minutes after a seizure, so you must use judgment in calling for help. - Avoid restraints but make sure the patient is in a bed with padded side rails - Place the individual in a lateral position with the neck slightly flexed; this will help the saliva drain from the mouth and prevent the tongue from falling backward - Remove all nearby furniture and other hazards from the area - Provide verbal assurance as the individual is regaining consciousness - Provide the patient with privacy if possible - Call for help and start treatment as ordered by the caregiver    After the Seizure (Postictal Stage)   After a seizure, most patients experience confusion, fatigue, muscle pain and/or a headache. Thus, one should permit the individual to sleep. For the next few days, reassurance is essential. Being calm and helping reorient the person is also of importance.   Most seizures are painless and end spontaneously. Seizures are not harmful to others but can lead to complications such as stress on the lungs, brain and the heart. Individuals with prior lung problems may develop labored breathing and respiratory distress. 

## 2024-06-16 NOTE — Progress Notes (Signed)
 Patient's daughter [name] has been made aware of discharge plan and transportation. Patient has been provided discharge instructions to include follow up appointment and medications. Patient verbalizes understanding.

## 2024-06-16 NOTE — Discharge Summary (Addendum)
 Physician Discharge Summary  [name] FMW:[id number] DOB: August 28, 1965 DOA: 06/15/2024  PCP: [name] Jereld BROCKS, NP  Admit date: 06/15/2024 Discharge date: 06/16/2024 30 Day Unplanned Readmission Risk Score    Flowsheet Row ED to Hosp-Admission (Current) from 06/15/2024 in [facility]  30 Day Unplanned Readmission Risk Score (%) 74.17 Filed at 06/16/2024 0801    This score is the patient's risk of an unplanned readmission within 30 days of being discharged (0 -100%). The score is based on dignosis, age, lab data, medications, orders, and past utilization.   Low:  0-14.9   Medium: 15-21.9   High: 22-29.9   Extreme: 30 and above          Admitted From: Home Disposition: Home  Recommendations for Outpatient Follow-up:  Follow up with PCP in 1-2 weeks Please obtain BMP/CBC in one week Follow-up with neurology in 4 weeks Please follow up with your PCP on the following pending results: Unresulted Labs (From admission, onward)     Start     Ordered   06/22/24 0500  Creatinine, serum  (enoxaparin  (LOVENOX )    CrCl >/= 30 ml/min)  Weekly,   R     Comments: while on enoxaparin  therapy    06/15/24 2123   06/15/24 1506  Levetiracetam  level  Once,   URGENT        06/15/24 1506   06/15/24 1340  Valproic  acid level  Once,   STAT        06/15/24 1339   06/15/24 1340  Pathologist smear review  Once,   R        06/15/24 1340              Home Health: None Equipment/Devices: None  Discharge Condition: Stable CODE STATUS: Full code Diet recommendation:  Diet Order             Diet heart healthy/carb modified Room service appropriate? Yes; Fluid consistency: Thin  Diet effective now                 Due to brief/overnight hospitalization, have copied admitting hospitalist HPI as below.  HPI: [name] is a 58 y.o. female with medical history significant of patient with history of seizure disorder, polysubstance abuse including alcohol ,  cocaine, depression, GERD, hypothyroidism, hyperlipidemia, COPD on 2 to 3 L of oxygen  at night at home, bipolar disorder, history of Roux-en-Y surgery chronic pain, atrial fibrillation type 2 diabetes who was at a friend's house when he had 3 witnessed seizures.  EMS arrived and he had 1 episode of seizure also in their presence.  They gave him 5 mg of midazolam  and brought him to the ER.  In the ER he was noted to have A-fib with RVR initially, patient remained postictal with confusion.  Neurology consulted and recommended EEG and they will follow.  He is arousable.  The dose of his Keppra  was increased from 250 mg of 500 mg.  He was recently discharged from the hospital on the 19th when he was admitted and was intractable nausea and vomiting from alcohol  induced gastritis.  No evidence of alcohol  withdrawal at the time.  At this point patient has been admitted with seizure disorder probably due to his known seizures or due to alcohol  withdrawal.   Subjective: Patient seen and examined, she is fully alert and oriented, she has no complaints.  She was able to answer all the questions appropriately about her COPD, oxygen  demand who she lives with,  family members and everything.  Discussed plan of discharge with her and she is wanting to go home.  Brief/Interim Summary: Patient was admitted overnight for breakthrough seizures.  Patient was seen by neurology.  According to the documentation, patient's Keppra  was recently reduced to 250 mg twice daily on December 25.  Keppra  level is pending at the time of discharge.  UDS was negative.  Valproic  acid is also pending.  Patient was loaded with Keppra  in the ED.  Neurology recommended increased dose of Keppra  500 mg p.o. twice daily and recommended discharging the patient since she is back at baseline.  I have also discussed the discharge plan with the daughter [name] over the phone who is in agreement with the discharge as well.  Patient has COPD, uses 4 to 5 L of  oxygen , she has no dyspnea, lungs are clear to auscultation, she is at her baseline oxygen  requirement due to chronic hypoxic respiratory failure.  Resuming all the home medications.  Discharge plan was discussed with patient and/or family member and they verbalized understanding and agreed with it.  Discharge Diagnoses:  Active Problems:   COPD (chronic obstructive pulmonary disease) (HCC)   Chronic respiratory failure with hypoxia (HCC)   Alcohol  use disorder, severe, dependence (HCC)   GERD with esophagitis   Schizoaffective disorder (HCC)   Tobacco abuse   Type 2 diabetes mellitus (HCC)   Continuous dependence on cigarette smoking   Seizure (HCC)   Polysubstance abuse (HCC)    Discharge Instructions   Allergies as of 06/16/2024       Reactions   Iron  Dextran Shortness Of Breath, Anxiety   Aspirin  Nausea And Vomiting, Other (See Comments)   Ok to take tylenol  or ibuprofen    Metformin  Hcl Er Diarrhea, Other (See Comments)   Gi issues with the 750 mg ER version   Penicillins Other (See Comments)   Unknown reaction from childhood: family would like this to remain as an allergy. Tolerated cephalosporins and zosyn         Medication List     STOP taking these medications    oxyCODONE -acetaminophen  5-325 MG tablet Commonly known as: PERCOCET/ROXICET       TAKE these medications    Accu-Chek Guide Me w/Device Kit Use up to four times daily as directed.   Accu-Chek Guide Test test strip Generic drug: glucose blood Use up to four times daily as directed.   Accu-Chek Softclix Lancets lancets Use up to four times daily as directed   divalproex  250 MG 24 hr tablet Commonly known as: Depakote  ER Take 1 tablet (250 mg total) by mouth daily.   folic acid  1 MG tablet Commonly known as: FOLVITE  Take 1 tablet (1 mg total) by mouth daily.   Ibuprofen  200 MG Caps Take 200-400 mg by mouth every 6 (six) hours as needed (for pain).   Lancet Device Misc Use up to four  times daily as directed. (FOR ICD-10 E10.9, E11.9).   levETIRAcetam  500 MG tablet Commonly known as: Keppra  Take 1 tablet (500 mg total) by mouth 2 (two) times daily. What changed:  medication strength how much to take   lidocaine  5 % Commonly known as: LIDODERM  Place 1 patch onto the skin daily as needed (for pain- Remove & Discard patch within 12 hours or as directed by MD).   metFORMIN  500 MG tablet Commonly known as: GLUCOPHAGE  Take 500 mg by mouth daily with breakfast.   ondansetron  4 MG disintegrating tablet Commonly known as: ZOFRAN -ODT Dissolve 1 tablet (  4 mg total) by mouth every 8 (eight) hours as needed for nausea or vomiting. What changed: reasons to take this   OXYGEN  Inhale 4-5 L/min into the lungs See admin instructions. Inhale 4-5 L/min into the lungs at bedtime and during the day as needed for shortness of breath   pantoprazole  40 MG tablet Commonly known as: Protonix  Take 1 tablet (40 mg total) by mouth daily.   thiamine  100 MG tablet Commonly known as: VITAMIN B1 Take 1 tablet (100 mg total) by mouth daily.   traZODone  50 MG tablet Commonly known as: DESYREL  Take 1 tablet (50 mg total) by mouth at bedtime as needed for sleep.   Ventolin  HFA 108 (90 Base) MCG/ACT inhaler Generic drug: albuterol  Inhale 2 puffs into the lungs every 4 (four) hours as needed for wheezing or shortness of breath.        Follow-up Information     Medina-Vargas, Monina C, NP Follow up in 1 week(s).   Specialty: Internal Medicine Contact information: [address] [place] [address] [phone]         [facility] Follow up in 1 month(s).   Contact information: [address]     [address] [place]  [address] [phone]               Allergies[1]  Consultations: Neurology   Procedures/Studies: EEG adult Result Date: 06/16/2024 Shelton Arlin KIDD, MD     06/16/2024  8:50 AM Patient Name: FAE BLOSSOM  MRN: [id number] Epilepsy Attending: Arlin KIDD Shelton Referring Physician/Provider: Merrianne Locus, MD Date: 06/16/2024 Duration: Patient history: 58 y.o. female with hx of seizures and noncompliance with seizure meds and missing several doses came with breakthrough seizures. EEG to evaluate for seizure Level of alertness: Awake, sleep AEDs during EEG study: LEV, VPA, Ativan  Technical aspects: This EEG study was done with scalp electrodes positioned according to the 10-20 International system of electrode placement. Electrical activity was reviewed with band pass filter of 1-70Hz , sensitivity of 7 uV/mm, display speed of 59mm/sec with a 60Hz  notched filter applied as appropriate. EEG data were recorded continuously and digitally stored.  Video monitoring was available and reviewed as appropriate. Description: The posterior dominant rhythm consists of 8 Hz activity of moderate voltage (25-35 uV) seen predominantly in posterior head regions, symmetric and reactive to eye opening and eye closing. EEG showed continuous generalized 3 to 6 Hz theta-delta slowing admixed with an excessive amount of 15 to 18 Hz beta activity distributed symmetrically and diffusely. Hyperventilation and photic stimulation were not performed.   ABNORMALITY - Continuous slow, generalized - Excessive beta, generalized IMPRESSION: This study is suggestive of generalized cerebral dysfunction (encephalopathy). The excessive beta activity seen in the background is most likely due to the effect of benzodiazepine and is a benign EEG pattern. No seizures or epileptiform discharges were seen throughout the recording. Priyanka O Yadav   CT Head Wo Contrast Result Date: 06/15/2024 EXAM: CT HEAD WITHOUT CONTRAST 06/15/2024 02:15:00 PM TECHNIQUE: CT of the head was performed without the administration of intravenous contrast. Automated exposure control, iterative reconstruction, and/or weight based adjustment of the mA/kV was utilized to reduce the  radiation dose to as low as reasonably achievable. COMPARISON: 05/25/2024 CLINICAL HISTORY: Mental status change, unknown cause FINDINGS: BRAIN AND VENTRICLES: No acute hemorrhage. No evidence of acute infarct. No hydrocephalus. No extra-axial collection. No mass effect or midline shift. ORBITS: No acute abnormality. SINUSES: No acute abnormality. SOFT TISSUES AND SKULL: No acute soft tissue  abnormality. No skull fracture. IMPRESSION: 1. No acute intracranial abnormality. Electronically signed by: Rogelia Myers MD 06/15/2024 02:57 PM EST RP Workstation: [id number]   CT Chest Wo Contrast Result Date: 06/15/2024 EXAM: CT CHEST WITHOUT CONTRAST 06/15/2024 02:15:00 PM TECHNIQUE: CT of the chest was performed without the administration of intravenous contrast. Multiplanar reformatted images are provided for review. Automated exposure control, iterative reconstruction, and/or weight based adjustment of the mA/kV was utilized to reduce the radiation dose to as low as reasonably achievable. COMPARISON: Prior CT abdomen dated 06/08/2024. CLINICAL HISTORY: Pneumonia, complication suspected, xray done. FINDINGS: MEDIASTINUM: scattered calcified aortic plaque. Right coronary calcifications. The central airways are clear. LYMPH NODES: No mediastinal, hilar or axillary lymphadenopathy. LUNGS AND PLEURA: Minimal linear atelectasis or scarring in the lung bases. No pleural effusion or pneumothorax. SOFT TISSUES/BONES: Chronic vertebral compression deformities of T1, T4, T6, T9. Superior plate compression deformity of L1, present on prior CT abdomen 06/08/2024. UPPER ABDOMEN: Limited images of the upper abdomen demonstrate post gastric bypass surgery. IMPRESSION: 1. No acute findings. Electronically signed by: Katheleen Faes MD 06/15/2024 02:47 PM EST RP Workstation: [id number]   DG Chest Portable 1 View Result Date: 06/15/2024 EXAM: 1 VIEW(S) XRAY OF THE CHEST 06/15/2024 01:49:00 PM COMPARISON: 05/28/2024 CLINICAL HISTORY:  aspiration evaluation FINDINGS: LUNGS AND PLEURA: Low lung volumes. No focal consolidations. No pleural effusion. No pneumothorax. HEART AND MEDIASTINUM: No acute abnormality of the cardiac and mediastinal silhouettes. BONES AND SOFT TISSUES: No acute osseous abnormality. IMPRESSION: 1. Low lung volumes. No acute airspace disease. Electronically signed by: Michaeline Blanch MD 06/15/2024 01:58 PM EST RP Workstation: [id number]   CT ABDOMEN PELVIS WO CONTRAST Result Date: 06/08/2024 CLINICAL DATA:  Abdominal pain. EXAM: CT ABDOMEN AND PELVIS WITHOUT CONTRAST TECHNIQUE: Multidetector CT imaging of the abdomen and pelvis was performed following the standard protocol without IV contrast. RADIATION DOSE REDUCTION: This exam was performed according to the departmental dose-optimization program which includes automated exposure control, adjustment of the mA and/or kV according to patient size and/or use of iterative reconstruction technique. COMPARISON:  05/28/2024. FINDINGS: Lower chest: Scattered subsegmental volume loss in the lung bases. No pleural fluid. Heart is at the upper limits of normal in size. Age advanced right coronary artery calcification. No pericardial or pleural effusion. Air and fluid in the distal esophagus. Hepatobiliary: Liver is borderline enlarged, 17.9 cm. Liver and gallbladder are otherwise unremarkable. No biliary ductal dilatation. Pancreas: Negative. Spleen: Negative. Adrenals/Urinary Tract: Adrenal glands and kidneys are unremarkable. Ureters are decompressed. Bladder is largely obscured by streak artifact from a right hip arthroplasty. Stomach/Bowel: Small hiatal hernia. Gastric bypass. There may be a mildly atonic segment at the small bowel anastomosis, deep to the umbilicus, nonspecific. Stomach, small bowel, appendix and colon are otherwise unremarkable. Vascular/Lymphatic: Atherosclerotic calcification of the aorta. No pathologically enlarged lymph nodes. Reproductive: Hysterectomy. Right  hip arthroplasty creates streak artifact in the anatomic pelvis. No definite adnexal mass. Other: No free fluid.  Mesenteries and peritoneum are unremarkable. Musculoskeletal: Right hip arthroplasty. Degenerative changes in the spine. Old T9 compression fracture. L1 compression fracture is similar to minimally progressive from 05/24/2024. IMPRESSION: 1. No acute findings. 2. L1 compression fracture is similar to minimally progressive from 05/24/2024. 3. Age advanced right coronary artery calcification. 4.  Aortic atherosclerosis (ICD10-I70.0). Electronically Signed   By: Newell Eke M.D.   On: 06/08/2024 13:47   CT ABDOMEN PELVIS W CONTRAST Result Date: 05/28/2024 EXAM: CT ABDOMEN AND PELVIS WITH CONTRAST 05/28/2024 08:48:03 AM TECHNIQUE: CT of the abdomen and pelvis  was performed with the administration of 75 mL of iohexol  (OMNIPAQUE ) 350 MG/ML injection. Multiplanar reformatted images are provided for review. Automated exposure control, iterative reconstruction, and/or weight-based adjustment of the mA/kV was utilized to reduce the radiation dose to as low as reasonably achievable. COMPARISON: 05/24/24 CLINICAL HISTORY: Abdominal pain, acute, nonlocalized; worsening xray. FINDINGS: LOWER CHEST: Areas of subsegmental atelectasis are present within the lingula and in both lower lobes. A patchy area of ground-glass attenuation is noted within the medial left upper lobe. Coronary artery calcifications are present. LIVER: An unchanged, too small to characterize 4 mm hypodensity is noted in the right lobe of the liver. GALLBLADDER AND BILE DUCTS: Gallbladder is unremarkable. Fusiform dilatation of the common bile duct measures up to 8 mm, previously 6 mm. No calcified stone is identified within the common bile duct. SPLEEN: The spleen is within normal limits in size and appearance. PANCREAS: The pancreas is normal in size and contour, without focal lesion or ductal dilatation. ADRENAL GLANDS: No periadrenal  stranding is present. No nodule, thickening, or hemorrhage is seen. KIDNEYS, URETERS AND BLADDER: The kidneys are normal in size and morphology bilaterally. No stones in the kidneys or ureters. No hydronephrosis. No perinephric or periureteral stranding. The urinary bladder is obscured by streak artifact from the patient's right hip arthroplasty device. GI AND BOWEL: Circumferential wall thickening of the distal esophagus with a small to moderate hiatal hernia is noted. Postoperative changes from previous gastric bypass surgery are present. No pathologic dilatation of the small bowel loops or signs of bowel inflammation are seen. Moderate colonic stool burden is noted involving the ascending colon. There is progressive gaseous distention of the redundant colon to the level of the descending colon with additional gaseous distention of the sigmoid colon. The distal sigmoid colon and rectum are decreased in caliber. A focal short segment of luminal narrowing at the junction between the descending colon and sigmoid colon is noted in the left hemiabdomen, on coronal image 65/6. There are nondilated loops of small bowel noted lateral to the dilated descending colon, which may reflect an underlying internal hernia. PERITONEUM AND RETROPERITONEUM: No free fluid or fluid collections are identified. No free air. VASCULATURE: Aorta is normal in caliber. Aortic atherosclerosis and aortic atherosclerotic calcifications are present. The mesenteric vasculature is patent. LYMPH NODES: No signs of abdominopelvic adenopathy are seen. REPRODUCTIVE ORGANS: The uterus is not visualized and may be surgically absent. No signs of adnexal mass are seen. BONES AND SOFT TISSUES: Previous right hip arthroplasty is noted. An unchanged compression fracture involves the T9 vertebra. A subacute fracture involving the superior endplate of T9 is again seen with mild loss of the superior endplate height. Approximately 4 mm retropulsion of the  superior posterior endplate is noted at this level. No new compression fractures are identified. No focal soft tissue abnormality. IMPRESSION: 1. Focal short segment luminal narrowing at the junction between the descending colon and sigmoid colon with associated gaseous distention of the redundant colon and sigmoid colon, and decreased caliber of the distal sigmoid colon and rectum. Nondilated small bowel loops lateral to the dilated descending colon may reflect an underlying internal hernia possibly resulting in partial obstruction of the left colon at the junction between the descending colon and sigmoid colon. 2. Fusiform dilatation of the common bile duct now measuring up to 8 mm, previously 6 mm. No calcified stone is identified. 3. Circumferential wall thickening of the distal esophagus with a small to moderate hiatal hernia. Electronically signed by: Taylor Stroud  MD 05/28/2024 09:27 AM EST RP Workstation: [id number]   DG Lumbar Spine Complete Result Date: 05/28/2024 EXAM: 4 VIEW(S) XRAY OF THE LUMBAR SPINE 05/28/2024 06:07:44 AM COMPARISON: Lumbar spine MRI 05/25/2024. CLINICAL HISTORY: fall? FINDINGS: LUMBAR SPINE: BONES: Stable appearance of the subacute fracture deformity involving the superior endplate of the L1 vertebra with less than 25 percent vertebral body height loss. No new fractures identified. The remaining lumbar vertebral body heights are well preserved. Alignment is normal. DISCS AND DEGENERATIVE CHANGES: The lumbar disc spaces are well preserved. No severe degenerative changes. SOFT TISSUES: Diffuse gaseous distention of the colon with moderate right-sided colonic stool burden. Signs of previous right total hip arthroplasty. No acute abnormality. IMPRESSION: 1. Stable subacute fracture deformity involving the superior endplate of the L1 vertebra with less than 25 percent vertebral body height loss. No new fractures identified. Electronically signed by: Waddell Calk MD 05/28/2024 06:18 AM  EST RP Workstation: [id number]   DG Abdomen Acute W/Chest Result Date: 05/28/2024 EXAM: UPRIGHT AND SUPINE XRAY VIEWS OF THE ABDOMEN AND 4 VIEW(S) OF THE CHEST 05/28/2024 06:07:44 AM COMPARISON: 05/25/2024 CLINICAL HISTORY: sob, constipation FINDINGS: LUNGS AND PLEURA: Mild elevation of the left hemidiaphragm with mild bibasilar atelectasis. No pleural effusion or pneumothorax. HEART AND MEDIASTINUM: Overlying cardiac leads. No acute abnormality of the cardiac and mediastinal silhouettes. BOWEL: Diffuse gaseous distention of the colon with moderate right-sided colonic stool burden. PERITONEUM AND SOFT TISSUES: No abnormal calcifications. No free air. BONES: Right total hip arthroplasty. No acute fracture. IMPRESSION: 1. Diffuse gaseous distention of the colon with moderate right-sided colonic stool burden. 2. Mild bibasilar atelectasis with mild elevation of the left hemidiaphragm. Electronically signed by: Waddell Calk MD 05/28/2024 06:16 AM EST RP Workstation: [id number]   DG Knee Complete 4 Views Right Result Date: 05/25/2024 CLINICAL DATA:  Fall. EXAM: RIGHT KNEE - COMPLETE 4+ VIEW COMPARISON:  None Available. FINDINGS: No evidence of fracture, dislocation, or joint effusion. There is moderate tricompartmental osteoarthrosis with joint space narrowing and osteophyte formation. Soft tissues are unremarkable. IMPRESSION: 1. No acute fracture or dislocation. 2. Moderate tricompartmental osteoarthrosis. Electronically Signed   By: Greig Pique M.D.   On: 05/25/2024 22:36   DG Chest 2 View Result Date: 05/25/2024 CLINICAL DATA:  Chest pain after fall EXAM: DG CHEST 2V COMPARISON:  Chest x-ray 05/21/2024 FINDINGS: The heart size and mediastinal contours are within normal limits. Both lungs are clear. The visualized skeletal structures are unremarkable. IMPRESSION: No active cardiopulmonary disease. Electronically Signed   By: Greig Pique M.D.   On: 05/25/2024 22:35   CT Cervical Spine Wo  Contrast Result Date: 05/25/2024 CLINICAL DATA:  Status post trauma. EXAM: CT CERVICAL SPINE WITHOUT CONTRAST TECHNIQUE: Multidetector CT imaging of the cervical spine was performed without intravenous contrast. Multiplanar CT image reconstructions were also generated. RADIATION DOSE REDUCTION: This exam was performed according to the departmental dose-optimization program which includes automated exposure control, adjustment of the mA and/or kV according to patient size and/or use of iterative reconstruction technique. COMPARISON:  Oct 29, 2023 FINDINGS: Alignment: There is straightening of the normal cervical spine lordosis. Skull base and vertebrae: No acute fracture. No primary bone lesion or focal pathologic process. Soft tissues and spinal canal: No prevertebral fluid or swelling. No visible canal hematoma. Disc levels: Mild endplate sclerosis, anterior osteophyte formation and posterior bony spurring are seen at the levels of C3-C4, C4-C5 and C5-C6. Mild multilevel intervertebral disc space narrowing is seen throughout all levels of the cervical spine, slightly more prominent at  the level of C2-C3. Mild, bilateral multilevel facet joint hypertrophy is noted. Upper chest: Negative. Other: None. IMPRESSION: 1. No acute fracture or subluxation in the cervical spine. 2. Mild multilevel degenerative changes. Electronically Signed   By: Suzen Dials M.D.   On: 05/25/2024 22:04   CT Head Wo Contrast Result Date: 05/25/2024 CLINICAL DATA:  Status post fall EXAM: CT HEAD WITHOUT CONTRAST TECHNIQUE: Contiguous axial images were obtained from the base of the skull through the vertex without intravenous contrast. RADIATION DOSE REDUCTION: This exam was performed according to the departmental dose-optimization program which includes automated exposure control, adjustment of the mA and/or kV according to patient size and/or use of iterative reconstruction technique. COMPARISON:  None Available. FINDINGS: Brain: No  evidence of acute infarction, hemorrhage, hydrocephalus, extra-axial collection or mass lesion/mass effect. Vascular: Mild bilateral cavernous carotid artery calcification is noted. Skull: Normal. Negative for fracture or focal lesion. Sinuses/Orbits: No acute finding. Other: None. IMPRESSION: No acute intracranial abnormality. Electronically Signed   By: Suzen Dials M.D.   On: 05/25/2024 22:01   MR LUMBAR SPINE WO CONTRAST Result Date: 05/25/2024 EXAM: MRI LUMBAR SPINE 05/25/2024 07:55:00 PM TECHNIQUE: Multiplanar multisequence MRI of the lumbar spine was performed without the administration of intravenous contrast. COMPARISON: CT abdomen and pelvis from 06/13/2024, lumbar spine radiograph 01/2024. CLINICAL HISTORY: Recent lumbar fracture with new numbness to right leg and falls. FINDINGS: BONES AND ALIGNMENT: Normal alignment except for findings at L1. There is a recent likely subacute wedge compression fracture of L1 with less than 25% height loss and approximately 2 mm of retropulsion of the posterior superior corner. Mild bone marrow edema is present at L1. Normal vertebral body heights otherwise. Bone marrow signal is unremarkable otherwise. SPINAL CORD: The conus terminates normally. SOFT TISSUES: No paraspinal mass. L1-L2: No significant disc herniation. No spinal canal stenosis or neural foraminal narrowing. L2-L3: No significant disc herniation. No spinal canal stenosis or neural foraminal narrowing. L3-L4: No significant disc herniation. No spinal canal stenosis or neural foraminal narrowing. L4-L5: Small disc bulge and moderate facet hypertrophy. No central spinal canal or neural foraminal stenosis. L5-S1: No significant disc herniation. Mild facet hypertrophy. No central spinal canal or neural foraminal stenosis. IMPRESSION: 1. Recent, likely subacute wedge compression fracture of L1 with less than 25% height loss, approximately 2 mm of retropulsion of the posterior superior corner, and mild  bone marrow edema. Electronically signed by: Franky Stanford MD 05/25/2024 08:56 PM EST RP Workstation: [id number]   CT ABDOMEN PELVIS W CONTRAST Result Date: 05/24/2024 EXAM: CT ABDOMEN AND PELVIS WITH CONTRAST 05/24/2024 08:42:08 AM TECHNIQUE: CT of the abdomen and pelvis was performed with the administration of 80 mL of iohexol  (OMNIPAQUE ) 300 MG/ML solution. Multiplanar reformatted images are provided for review. Automated exposure control, iterative reconstruction, and/or weight-based adjustment of the mA/kV was utilized to reduce the radiation dose to as low as reasonably achievable. COMPARISON: Recent CT abdomen and pelvis 05/21/2024, and earlier. CLINICAL HISTORY: 58 year old female. Abdominal pain, acute, nonlocalized; Abdominal/flank pain, stone suspected. FINDINGS: LOWER CHEST: Mild lung base atelectasis, overall lung base ventilation has not significantly changed. LIVER: The liver is unremarkable. GALLBLADDER AND BILE DUCTS: Gallbladder is unremarkable. No biliary ductal dilatation. SPLEEN: No acute abnormality. PANCREAS: No acute abnormality. ADRENAL GLANDS: No acute abnormality. KIDNEYS, URETERS AND BLADDER: No stones in the kidneys or ureters. No hydronephrosis. No perinephric or periureteral stranding. Urinary bladder is unremarkable. 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  The mid transverse colon is more gas distended today, but otherwise stable bowel loops, no evidence of mechanical obstruction. No free air, free fluid, or mesenteric inflammation identified. PERITONEUM AND RETROPERITONEUM: No ascites. No free air. No free fluid. No mesenteric inflammation. VASCULATURE: Abdominal aortic atherosclerosis is moderate. Major arterial and portal venous structures in the abdomen and pelvis appear patent. Aorta is normal in caliber. LYMPH NODES: No lymphadenopathy. REPRODUCTIVE ORGANS: Streak artifact again limits visualization of the deep  pelvis. 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 Acute or subacute mild L1 compression fracture. L1 superior endplate compression fracture is new. Mild superior endplate deformity. L1 now measures 19.3 mm versus 22.8 mm normally. This is a 15% loss of vertebral body height. Subtle retropulsion of the posterior superior endplate on series 9 image 94. No significant spinal stenosis or complicating features. 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 No other acute osseous abnormality identified. IMPRESSION: 1. Acute mild L1 compression fracture with 15% loss of height. No significant retropulsion or other complicating features. 2. No other acute or inflammatory process identified in the abdomen or pelvis. 3. Stable roux-en-y type gastric bypass. Calcified aortic atherosclerosis. Electronically signed by: Helayne Hurst MD 05/24/2024 08:52 AM EST RP Workstation: [id number]   CT ABDOMEN PELVIS W CONTRAST Result Date: 05/21/2024 CLINICAL DATA:  Upper abdominal pain for 3 days with nausea and vomiting for 2 days. EXAM: CT ABDOMEN AND PELVIS WITH CONTRAST TECHNIQUE: Multidetector CT imaging of the abdomen and pelvis was performed using the standard protocol following bolus administration of intravenous contrast. RADIATION DOSE REDUCTION: This exam was performed according to the departmental dose-optimization program which includes automated exposure control, adjustment of the mA and/or kV according to patient size and/or use of iterative reconstruction technique. CONTRAST:  75mL OMNIPAQUE  IOHEXOL  350 MG/ML SOLN COMPARISON:  03/29/2024 and older exams. FINDINGS: Lower chest: Clear lung bases. Hepatobiliary: No focal liver abnormality is seen. No gallstones, gallbladder wall thickening, or biliary dilatation. Pancreas: Unremarkable. No  pancreatic ductal dilatation or surrounding inflammatory changes. Spleen: Normal in size without focal abnormality. Adrenals/Urinary Tract: Normal adrenal glands. Normal kidneys and ureters. Bladder decompressed. Stomach/Bowel: Previous gastric bypass surgery. No evidence of bowel obstruction. No stomach or bowel wall thickening or inflammation. Vascular/Lymphatic: Aortic atherosclerosis. No aneurysm. No enlarged lymph nodes. Reproductive: Status post hysterectomy. No adnexal masses. Other: No hernia.  No ascites. Musculoskeletal: Chronic compression fracture of T9. No acute fracture. No bone lesion. 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 IMPRESSION: 1. No acute findings. 2. Previous Roux-en-Y gastric bypass, without evidence of bowel obstruction or inflammation and unchanged from the prior CT. 3. Aortic atherosclerosis. Electronically Signed   By: Alm Parkins M.D.   On: 05/21/2024 11:06   DG Chest 1 View Result Date: 05/21/2024 CLINICAL DATA:  Cough.  Shortness of breath. EXAM: CHEST  1 VIEW COMPARISON:  02/03/2024 FINDINGS: The heart size and mediastinal contours are within normal limits. Both lungs are clear. The visualized skeletal structures are unremarkable. IMPRESSION: No active disease. Electronically Signed   By: Norleen DELENA Kil M.D.   On: 05/21/2024 09:42     Discharge Exam: Vitals:   06/16/24 0550 06/16/24 0747  BP: 134/85 135/77  Pulse:  92  Resp:  19  Temp:  98.4 F (36.9 C)  SpO2:     Vitals:   06/16/24 0100 06/16/24 0408 06/16/24 0550 06/16/24  0747  BP:   134/85 135/77  Pulse:  85  92  Resp: 19   19  Temp:  98.8 F (37.1 C)  98.4 F (36.9 C)  TempSrc:  Oral  Oral  SpO2:  98%    Weight:      Height:        General: Pt is alert, awake, not in acute distress Cardiovascular: RRR, S1/S2 +, no rubs, no gallops Respiratory: CTA bilaterally, no wheezing, no rhonchi Abdominal: Soft, NT, ND, bowel sounds + Extremities: no edema, no  cyanosis    The results of significant diagnostics from this hospitalization (including imaging, microbiology, ancillary and laboratory) are listed below for reference.     Microbiology: Recent Results (from the past 240 hours)  Resp panel by RT-PCR (RSV, Flu A&B, Covid) Anterior Nasal Swab     Status: None   Collection Time: 06/15/24  1:40 PM   Specimen: Anterior Nasal Swab  Result Value Ref Range Status   SARS Coronavirus 2 by RT PCR NEGATIVE NEGATIVE Final   Influenza A by PCR NEGATIVE NEGATIVE Final   Influenza B by PCR NEGATIVE NEGATIVE Final    Comment: (NOTE) The Xpert Xpress SARS-CoV-2/FLU/RSV plus assay is intended as an aid in the diagnosis of influenza from Nasopharyngeal swab specimens and should not be used as a sole basis for treatment. Nasal washings and aspirates are unacceptable for Xpert Xpress SARS-CoV-2/FLU/RSV testing.  Fact Sheet for Patients: bloggercourse.com  Fact Sheet for Healthcare Providers: seriousbroker.it  This test is not yet approved or cleared by the [place]  FDA and has been authorized for detection and/or diagnosis of SARS-CoV-2 by FDA under an Emergency Use Authorization (EUA). This EUA will remain in effect (meaning this test can be used) for the duration of the COVID-19 declaration under Section 564(b)(1) of the Act, 21 U.S.C. section 360bbb-3(b)(1), unless the authorization is terminated or revoked.     Resp Syncytial Virus by PCR NEGATIVE NEGATIVE Final    Comment: (NOTE) Fact Sheet for Patients: bloggercourse.com  Fact Sheet for Healthcare Providers: seriousbroker.it  This test is not yet approved or cleared by the [place]  FDA and has been authorized for detection and/or diagnosis of SARS-CoV-2 by FDA under an Emergency Use Authorization (EUA). This EUA will remain in effect (meaning this test can be used) for the  duration of the COVID-19 declaration under Section 564(b)(1) of the Act, 21 U.S.C. section 360bbb-3(b)(1), unless the authorization is terminated or revoked.  Performed at [facility], [address], [place], [place] [address]      Labs: BNP (last 3 results) Recent Labs    12/11/23 0349 12/12/23 0427 01/23/24 1900  BNP 55.2 95.6 17.0   Basic Metabolic Panel: Recent Labs  Lab 06/10/24 0910 06/15/24 1340 06/15/24 2238 06/16/24 0454  NA 129* 140  --  137  K 4.9 4.5  --  4.0  CL 96* 103  --  102  CO2 20* 22  --  22  GLUCOSE 177* 174*  --  115*  BUN <5* 9  --  <5*  CREATININE 0.55 0.73 0.65 0.61  CALCIUM  8.8* 8.6*  --  8.1*  MG 2.1  --   --   --    Liver Function Tests: Recent Labs  Lab 06/10/24 0910 06/16/24 0454  AST 108* 26  ALT 112* 32  ALKPHOS 160* 132*  BILITOT 0.6 1.3*  PROT 7.1 6.8  ALBUMIN  4.0 3.9   Recent Labs  Lab 06/15/24 1340  LIPASE  18   No results for input(s): AMMONIA in the last 168 hours. CBC: Recent Labs  Lab 06/10/24 0910 06/15/24 1340 06/15/24 2238 06/16/24 0454  WBC 5.1 10.7* 7.9 7.2  NEUTROABS 3.0 9.7*  --   --   HGB 9.9* 11.2* 9.7* 10.8*  HCT 32.8* 36.1 31.9* 35.9*  MCV 78.8* 76.8* 78.0* 79.2*  PLT 287 264 140* 216   Cardiac Enzymes: No results for input(s): CKTOTAL, CKMB, CKMBINDEX, TROPONINI in the last 168 hours. BNP: Invalid input(s): POCBNP CBG: Recent Labs  Lab 06/09/24 2200 06/10/24 0808 06/15/24 1335 06/15/24 2305 06/16/24 0606  GLUCAP 148* 91 174* 219* 115*   D-Dimer No results for input(s): DDIMER in the last 72 hours. Hgb A1c No results for input(s): HGBA1C in the last 72 hours. Lipid Profile No results for input(s): CHOL, HDL, LDLCALC, TRIG, CHOLHDL, LDLDIRECT in the last 72 hours. Thyroid  function studies No results for input(s): TSH, T4TOTAL, T3FREE, THYROIDAB in the last 72 hours.  Invalid input(s): FREET3 Anemia work up No results for input(s):  VITAMINB12, FOLATE, FERRITIN, TIBC, IRON , RETICCTPCT in the last 72 hours. Urinalysis    Component Value Date/Time   COLORURINE YELLOW 06/15/2024 1340   APPEARANCEUR CLEAR 06/15/2024 1340   LABSPEC 1.009 06/15/2024 1340   PHURINE 6.0 06/15/2024 1340   GLUCOSEU NEGATIVE 06/15/2024 1340   HGBUR NEGATIVE 06/15/2024 1340   BILIRUBINUR NEGATIVE 06/15/2024 1340   KETONESUR NEGATIVE 06/15/2024 1340   PROTEINUR 30 (A) 06/15/2024 1340   UROBILINOGEN 0.2 09/04/2013 0426   NITRITE NEGATIVE 06/15/2024 1340   LEUKOCYTESUR NEGATIVE 06/15/2024 1340   Sepsis Labs Recent Labs  Lab 06/10/24 0910 06/15/24 1340 06/15/24 2238 06/16/24 0454  WBC 5.1 10.7* 7.9 7.2   Microbiology Recent Results (from the past 240 hours)  Resp panel by RT-PCR (RSV, Flu A&B, Covid) Anterior Nasal Swab     Status: None   Collection Time: 06/15/24  1:40 PM   Specimen: Anterior Nasal Swab  Result Value Ref Range Status   SARS Coronavirus 2 by RT PCR NEGATIVE NEGATIVE Final   Influenza A by PCR NEGATIVE NEGATIVE Final   Influenza B by PCR NEGATIVE NEGATIVE Final    Comment: (NOTE) The Xpert Xpress SARS-CoV-2/FLU/RSV plus assay is intended as an aid in the diagnosis of influenza from Nasopharyngeal swab specimens and should not be used as a sole basis for treatment. Nasal washings and aspirates are unacceptable for Xpert Xpress SARS-CoV-2/FLU/RSV testing.  Fact Sheet for Patients: bloggercourse.com  Fact Sheet for Healthcare Providers: seriousbroker.it  This test is not yet approved or cleared by the [place]  FDA and has been authorized for detection and/or diagnosis of SARS-CoV-2 by FDA under an Emergency Use Authorization (EUA). This EUA will remain in effect (meaning this test can be used) for the duration of the COVID-19 declaration under Section 564(b)(1) of the Act, 21 U.S.C. section 360bbb-3(b)(1), unless the authorization is terminated  or revoked.     Resp Syncytial Virus by PCR NEGATIVE NEGATIVE Final    Comment: (NOTE) Fact Sheet for Patients: bloggercourse.com  Fact Sheet for Healthcare Providers: seriousbroker.it  This test is not yet approved or cleared by the [place]  FDA and has been authorized for detection and/or diagnosis of SARS-CoV-2 by FDA under an Emergency Use Authorization (EUA). This EUA will remain in effect (meaning this test can be used) for the duration of the COVID-19 declaration under Section 564(b)(1) of the Act, 21 U.S.C. section 360bbb-3(b)(1), unless the authorization is terminated or revoked.  Performed at [facility], [address] [place], [place] [address]     FURTHER DISCHARGE INSTRUCTIONS:   Get Medicines reviewed and adjusted: Please take all your medications with you for your next visit with your Primary MD   Laboratory/radiological data: Please request your Primary MD to go over all hospital tests and procedure/radiological results at the follow up, please ask your Primary MD to get all Hospital records sent to his/her office.   In some cases, they will be blood work, cultures and biopsy results pending at the time of your discharge. Please request that your primary care M.D. goes through all the records of your hospital data and follows up on these results.   Also Note the following: If you experience worsening of your admission symptoms, develop shortness of breath, life threatening emergency, suicidal or homicidal thoughts you must seek medical attention immediately by calling 911 or calling your MD immediately  if symptoms less severe.   You must read complete instructions/literature along with all the possible adverse reactions/side effects for all the Medicines you take and that have been prescribed to you. Take any new Medicines after you have completely understood and accpet all the possible adverse  reactions/side effects.    patient was instructed, not to drive, operate heavy machinery, perform activities at heights, swimming or participation in water  activities or provide baby-sitting services while on Pain, Sleep and Anxiety Medications; until their outpatient Physician has advised to do so again. Also recommended to not to take more than prescribed Pain, Sleep and Anxiety Medications.  It is not advisable to combine anxiety, sleep and pain medications without talking with your primary care provider.     Wear Seat belts while driving.   Please note: You were cared for by a hospitalist during your hospital stay. Once you are discharged, your primary care physician will handle any further medical issues. Please note that NO REFILLS for any discharge medications will be authorized once you are discharged, as it is imperative that you return to your primary care physician (or establish a relationship with a primary care physician if you do not have one) for your post hospital discharge needs so that they can reassess your need for medications and monitor your lab values  Time coordinating discharge: Over 30 minutes  SIGNED:   Fredia Skeeter, MD  [facility] 06/16/2024, 10:27 AM *Please note that this is a verbal dictation therefore any spelling or grammatical errors are due to the Dragon Medical One system interpretation. If 7PM-7AM, please contact night-coverage [email or web address]     [1]  Allergies Allergen Reactions   Iron  Dextran Shortness Of Breath and Anxiety   Aspirin  Nausea And Vomiting and Other (See Comments)    Ok to take tylenol  or ibuprofen     Metformin  Hcl Er Diarrhea and Other (See Comments)    Gi issues with the 750 mg ER version   Penicillins Other (See Comments)    Unknown reaction from childhood: family would like this to remain as an allergy.  Tolerated cephalosporins and zosyn 

## 2024-06-16 NOTE — Progress Notes (Signed)
" °   06/15/24 2200  Assess: MEWS Score  Pulse Rate (!) 114  Assess: MEWS Score  MEWS Temp 0  MEWS Systolic 0  MEWS Pulse 2  MEWS RR 1  MEWS LOC 0  MEWS Score 3  MEWS Score Color Yellow  Assess: if the MEWS score is Yellow or Red  Were vital signs accurate and taken at a resting state? Yes  Does the patient meet 2 or more of the SIRS criteria? No  MEWS guidelines implemented  No, previously yellow, continue vital signs every 4 hours  Notify: Charge Nurse/RN  Name of Charge Nurse/RN Notified Alan, RN  Provider Notification  Provider Name/Title Sim, MD  Date Provider Notified 06/15/24  Time Provider Notified 2206  Method of Notification Page  Notification Reason Other (Comment) (yellow MEWS/agitation)  Provider response See new orders  Date of Provider Response 06/15/24  Time of Provider Response 2212  Assess: SIRS CRITERIA  SIRS Temperature  0  SIRS Respirations  1  SIRS Pulse 1  SIRS WBC 0  SIRS Score Sum  2   Pt agitated and anxious, BP and HR elevated, upper extremities tremor and sweating. Pt in no distress. Pt has history of ETOH abuse. According to pt, she drinks 40oz of beer every day. MD notified and CIWA protocol ordered.  Will continue to monitor pt closely.  "

## 2024-06-16 NOTE — Care Management Obs Status (Signed)
 MEDICARE OBSERVATION STATUS NOTIFICATION   Patient Details  Name: [name] MRN: [id number] Date of Birth: 1965-11-04   Medicare Observation Status Notification Given:  Yes    Tom-Johnson, Harvest Muskrat, RN 06/16/2024, 10:18 AM

## 2024-06-16 NOTE — Care Management CC44 (Signed)
"         Condition Code 44 Documentation Completed  Patient Details  Name: [name] MRN: [id number] Date of Birth: 10-15-1965   Condition Code 44 given:  Yes Patient signature on Condition Code 44 notice:  Yes Documentation of 2 MD's agreement:  Yes Code 44 added to claim:  Yes    Tom-Johnson, Harvest Muskrat, RN 06/16/2024, 10:18 AM  "

## 2024-06-16 NOTE — Procedures (Addendum)
 Patient Name: [name]  MRN: [id number]  Epilepsy Attending: Arlin MALVA Krebs  Referring Physician/Provider: Merrianne Locus, MD  Date: 06/16/2024 Duration: 23.31 mins  Patient history: 58 y.o. female with hx of seizures and noncompliance with seizure meds and missing several doses came with breakthrough seizures. EEG to evaluate for seizure  Level of alertness: Awake, sleep  AEDs during EEG study: LEV, VPA, Ativan   Technical aspects: This EEG study was done with scalp electrodes positioned according to the 10-20 International system of electrode placement. Electrical activity was reviewed with band pass filter of 1-70Hz , sensitivity of 7 uV/mm, display speed of 59mm/sec with a 60Hz  notched filter applied as appropriate. EEG data were recorded continuously and digitally stored.  Video monitoring was available and reviewed as appropriate.  Description: The posterior dominant rhythm consists of 8 Hz activity of moderate voltage (25-35 uV) seen predominantly in posterior head regions, symmetric and reactive to eye opening and eye closing. EEG showed continuous generalized 3 to 6 Hz theta-delta slowing admixed with an excessive amount of 15 to 18 Hz beta activity distributed symmetrically and diffusely. Hyperventilation and photic stimulation were not performed.     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 The excessive beta activity seen in the background is most likely due to the effect of benzodiazepine and is a benign EEG pattern. No seizures or epileptiform discharges were seen throughout the recording.  Doral Digangi O Jareb Radoncic

## 2024-06-16 NOTE — Progress Notes (Signed)
 STAT EEG complete - results pending. ? ?

## 2024-06-16 NOTE — Progress Notes (Signed)
 Right FA IV infiltrated, no redness, edema only. Pharmacy notified, no new orders at this time.

## 2024-06-16 NOTE — TOC Transition Note (Signed)
 Transition of Care [facility]) - Discharge Note   Patient Details  Name: [name] MRN: [id number] Date of Birth: 1965-07-12  [facility]) CM/SW Contact:  Tom-Johnson, Harvest Muskrat, RN Phone Number: 06/16/2024, 10:22 AM   Clinical Narrative:     Patient is scheduled for discharge today.  Readmission Risk Assessment done. Outpatient f/u, hospital f/u and discharge instructions on AVS. Patient requested for a cab, Therapist, Nutritional and Release of Liability form explained to patient with understanding verbalized, form signed and placed in patient's chart. Cab voucher given to RN to transport at discharge. No further ICM needs noted.      Final next level of care: Home/Self Care Barriers to Discharge: Barriers Resolved   Patient Goals and CMS Choice Patient states their goals for this hospitalization and ongoing recovery are:: To return home CMS Medicare.gov Compare Post Acute Care list provided to:: Patient Choice offered to / list presented to : Patient      Discharge Placement                Patient to be transferred to facility by: [facility]      Discharge Plan and Services Additional resources added to the After Visit Summary for                  DME Arranged: N/A DME Agency: NA       HH Arranged: NA HH Agency: NA        Social Drivers of Health (SDOH) Interventions SDOH Screenings   Food Insecurity: No Food Insecurity (06/08/2024)  Recent Concern: Food Insecurity - Food Insecurity Present (05/28/2024)  Housing: Low Risk (06/08/2024)  Recent Concern: Housing - High Risk (05/28/2024)  Transportation Needs: No Transportation Needs (06/08/2024)  Recent Concern: Transportation Needs - Unmet Transportation Needs (05/28/2024)  Utilities: Not At Risk (06/08/2024)  Alcohol  Screen: High Risk (02/14/2024)  Depression (PHQ2-9): High Risk (02/05/2024)  Financial Resource Strain: Low Risk (02/05/2024)  Physical Activity: Inactive (02/05/2024)  Social  Connections: Socially Isolated (02/05/2024)  Stress: Stress Concern Present (02/05/2024)  Tobacco Use: High Risk (06/15/2024)     Readmission Risk Interventions    05/31/2024    2:28 PM 12/02/2023   12:47 PM 11/05/2023    1:34 PM  Readmission Risk Prevention Plan  Transportation Screening Complete Complete Complete  Medication Review (RN Care Manager) Referral to Pharmacy Referral to Pharmacy Referral to Pharmacy  PCP or Specialist appointment within 3-5 days of discharge Complete Complete Complete  HRI or Home Care Consult Complete Complete Complete  SW Recovery Care/Counseling Consult Complete Complete Complete  Palliative Care Screening Not Applicable Not Applicable Not Applicable  Skilled Nursing Facility Not Applicable Not Applicable Not Applicable

## 2024-06-18 LAB — LEVETIRACETAM LEVEL: Levetiracetam Lvl: 53.9 ug/mL — ABNORMAL HIGH (ref 10.0–40.0)

## 2024-06-20 LAB — PATHOLOGIST SMEAR REVIEW

## 2024-07-11 ENCOUNTER — Other Ambulatory Visit (HOSPITAL_COMMUNITY): Payer: Self-pay

## 2024-07-18 ENCOUNTER — Emergency Department (HOSPITAL_COMMUNITY)

## 2024-07-18 ENCOUNTER — Inpatient Hospital Stay (HOSPITAL_COMMUNITY)
Admission: EM | Admit: 2024-07-18 | Discharge: 2024-07-21 | DRG: 100 | Disposition: A | Discharge status: Home / Self Care

## 2024-07-18 ENCOUNTER — Encounter (HOSPITAL_COMMUNITY): Payer: Self-pay

## 2024-07-18 ENCOUNTER — Other Ambulatory Visit: Payer: Self-pay

## 2024-07-18 DIAGNOSIS — F209 Schizophrenia, unspecified: Secondary | ICD-10-CM

## 2024-07-18 DIAGNOSIS — R569 Unspecified convulsions: Secondary | ICD-10-CM

## 2024-07-18 DIAGNOSIS — G40909 Epilepsy, unspecified, not intractable, without status epilepticus: Secondary | ICD-10-CM | POA: Diagnosis not present

## 2024-07-18 DIAGNOSIS — U071 COVID-19: Secondary | ICD-10-CM | POA: Diagnosis not present

## 2024-07-18 DIAGNOSIS — K219 Gastro-esophageal reflux disease without esophagitis: Secondary | ICD-10-CM | POA: Diagnosis present

## 2024-07-18 DIAGNOSIS — T18108A Unspecified foreign body in esophagus causing other injury, initial encounter: Secondary | ICD-10-CM | POA: Diagnosis not present

## 2024-07-18 DIAGNOSIS — Z9981 Dependence on supplemental oxygen: Secondary | ICD-10-CM

## 2024-07-18 DIAGNOSIS — E860 Dehydration: Secondary | ICD-10-CM | POA: Diagnosis present

## 2024-07-18 DIAGNOSIS — Z888 Allergy status to other drugs, medicaments and biological substances status: Secondary | ICD-10-CM

## 2024-07-18 DIAGNOSIS — Z91148 Patient's other noncompliance with medication regimen for other reason: Secondary | ICD-10-CM

## 2024-07-18 DIAGNOSIS — E1140 Type 2 diabetes mellitus with diabetic neuropathy, unspecified: Secondary | ICD-10-CM | POA: Diagnosis present

## 2024-07-18 DIAGNOSIS — F259 Schizoaffective disorder, unspecified: Secondary | ICD-10-CM | POA: Diagnosis present

## 2024-07-18 DIAGNOSIS — Z825 Family history of asthma and other chronic lower respiratory diseases: Secondary | ICD-10-CM

## 2024-07-18 DIAGNOSIS — F1910 Other psychoactive substance abuse, uncomplicated: Secondary | ICD-10-CM | POA: Diagnosis present

## 2024-07-18 DIAGNOSIS — Z7984 Long term (current) use of oral hypoglycemic drugs: Secondary | ICD-10-CM

## 2024-07-18 DIAGNOSIS — T426X6A Underdosing of other antiepileptic and sedative-hypnotic drugs, initial encounter: Secondary | ICD-10-CM | POA: Diagnosis not present

## 2024-07-18 DIAGNOSIS — J9611 Chronic respiratory failure with hypoxia: Secondary | ICD-10-CM | POA: Diagnosis not present

## 2024-07-18 DIAGNOSIS — J449 Chronic obstructive pulmonary disease, unspecified: Secondary | ICD-10-CM | POA: Diagnosis present

## 2024-07-18 DIAGNOSIS — F1410 Cocaine abuse, uncomplicated: Secondary | ICD-10-CM | POA: Diagnosis present

## 2024-07-18 DIAGNOSIS — Z8249 Family history of ischemic heart disease and other diseases of the circulatory system: Secondary | ICD-10-CM

## 2024-07-18 DIAGNOSIS — F603 Borderline personality disorder: Secondary | ICD-10-CM | POA: Diagnosis present

## 2024-07-18 DIAGNOSIS — Z880 Allergy status to penicillin: Secondary | ICD-10-CM

## 2024-07-18 DIAGNOSIS — I4891 Unspecified atrial fibrillation: Secondary | ICD-10-CM | POA: Diagnosis present

## 2024-07-18 DIAGNOSIS — Z833 Family history of diabetes mellitus: Secondary | ICD-10-CM

## 2024-07-18 DIAGNOSIS — I10 Essential (primary) hypertension: Secondary | ICD-10-CM | POA: Diagnosis present

## 2024-07-18 DIAGNOSIS — R935 Abnormal findings on diagnostic imaging of other abdominal regions, including retroperitoneum: Secondary | ICD-10-CM

## 2024-07-18 DIAGNOSIS — W449XXA Unspecified foreign body entering into or through a natural orifice, initial encounter: Secondary | ICD-10-CM | POA: Diagnosis present

## 2024-07-18 DIAGNOSIS — Z9884 Bariatric surgery status: Secondary | ICD-10-CM

## 2024-07-18 DIAGNOSIS — T182XXA Foreign body in stomach, initial encounter: Secondary | ICD-10-CM | POA: Diagnosis present

## 2024-07-18 DIAGNOSIS — R509 Fever, unspecified: Principal | ICD-10-CM

## 2024-07-18 DIAGNOSIS — T189XXS Foreign body of alimentary tract, part unspecified, sequela: Secondary | ICD-10-CM

## 2024-07-18 DIAGNOSIS — D509 Iron deficiency anemia, unspecified: Secondary | ICD-10-CM | POA: Diagnosis present

## 2024-07-18 DIAGNOSIS — Z79899 Other long term (current) drug therapy: Secondary | ICD-10-CM

## 2024-07-18 DIAGNOSIS — Z96641 Presence of right artificial hip joint: Secondary | ICD-10-CM | POA: Diagnosis present

## 2024-07-18 DIAGNOSIS — G40919 Epilepsy, unspecified, intractable, without status epilepticus: Secondary | ICD-10-CM | POA: Diagnosis not present

## 2024-07-18 DIAGNOSIS — F319 Bipolar disorder, unspecified: Secondary | ICD-10-CM | POA: Diagnosis present

## 2024-07-18 DIAGNOSIS — E785 Hyperlipidemia, unspecified: Secondary | ICD-10-CM | POA: Diagnosis present

## 2024-07-18 DIAGNOSIS — E039 Hypothyroidism, unspecified: Secondary | ICD-10-CM | POA: Diagnosis present

## 2024-07-18 DIAGNOSIS — F1010 Alcohol abuse, uncomplicated: Secondary | ICD-10-CM | POA: Diagnosis present

## 2024-07-18 DIAGNOSIS — F17210 Nicotine dependence, cigarettes, uncomplicated: Secondary | ICD-10-CM | POA: Diagnosis present

## 2024-07-18 DIAGNOSIS — Z886 Allergy status to analgesic agent status: Secondary | ICD-10-CM

## 2024-07-18 DIAGNOSIS — E872 Acidosis, unspecified: Secondary | ICD-10-CM | POA: Diagnosis present

## 2024-07-18 DIAGNOSIS — E119 Type 2 diabetes mellitus without complications: Secondary | ICD-10-CM

## 2024-07-18 LAB — I-STAT CHEM 8, ED
BUN: 5 mg/dL — ABNORMAL LOW (ref 6–20)
Calcium, Ion: 0.88 mmol/L — CL (ref 1.15–1.40)
Chloride: 99 mmol/L (ref 98–111)
Creatinine, Ser: 0.6 mg/dL (ref 0.44–1.00)
Glucose, Bld: 141 mg/dL — ABNORMAL HIGH (ref 70–99)
HCT: 40 % (ref 36.0–46.0)
Hemoglobin: 13.6 g/dL (ref 12.0–15.0)
Potassium: 4.1 mmol/L (ref 3.5–5.1)
Sodium: 132 mmol/L — ABNORMAL LOW (ref 135–145)
TCO2: 20 mmol/L — ABNORMAL LOW (ref 22–32)

## 2024-07-18 LAB — URINALYSIS, W/ REFLEX TO CULTURE (INFECTION SUSPECTED)
Bacteria, UA: NONE SEEN
Bilirubin Urine: NEGATIVE
Glucose, UA: NEGATIVE mg/dL
Hgb urine dipstick: NEGATIVE
Ketones, ur: NEGATIVE mg/dL
Leukocytes,Ua: NEGATIVE
Nitrite: NEGATIVE
Protein, ur: NEGATIVE mg/dL
Specific Gravity, Urine: 1.017 (ref 1.005–1.030)
pH: 6 (ref 5.0–8.0)

## 2024-07-18 LAB — COMPREHENSIVE METABOLIC PANEL WITH GFR
ALT: 25 U/L (ref 0–44)
AST: 32 U/L (ref 15–41)
Albumin: 4 g/dL (ref 3.5–5.0)
Alkaline Phosphatase: 99 U/L (ref 38–126)
Anion gap: 19 — ABNORMAL HIGH (ref 5–15)
BUN: 6 mg/dL (ref 6–20)
CO2: 18 mmol/L — ABNORMAL LOW (ref 22–32)
Calcium: 8.4 mg/dL — ABNORMAL LOW (ref 8.9–10.3)
Chloride: 95 mmol/L — ABNORMAL LOW (ref 98–111)
Creatinine, Ser: 0.69 mg/dL (ref 0.44–1.00)
GFR, Estimated: >60 mL/min
Glucose, Bld: 152 mg/dL — ABNORMAL HIGH (ref 70–99)
Potassium: 4.2 mmol/L (ref 3.5–5.1)
Sodium: 131 mmol/L — ABNORMAL LOW (ref 135–145)
Total Bilirubin: 0.7 mg/dL (ref 0.0–1.2)
Total Protein: 6.9 g/dL (ref 6.5–8.1)

## 2024-07-18 LAB — CBC WITH DIFFERENTIAL/PLATELET
Abs Immature Granulocytes: 0.04 10*3/uL (ref 0.00–0.07)
Basophils Absolute: 0 10*3/uL (ref 0.0–0.1)
Basophils Relative: 0 %
Eosinophils Absolute: 0 10*3/uL (ref 0.0–0.5)
Eosinophils Relative: 0 %
HCT: 35.6 % — ABNORMAL LOW (ref 36.0–46.0)
Hemoglobin: 11.1 g/dL — ABNORMAL LOW (ref 12.0–15.0)
Immature Granulocytes: 0 %
Lymphocytes Relative: 6 %
Lymphs Abs: 0.7 10*3/uL (ref 0.7–4.0)
MCH: 25.5 pg — ABNORMAL LOW (ref 26.0–34.0)
MCHC: 31.2 g/dL (ref 30.0–36.0)
MCV: 81.7 fL (ref 80.0–100.0)
Monocytes Absolute: 0.6 10*3/uL (ref 0.1–1.0)
Monocytes Relative: 6 %
Neutro Abs: 9.8 10*3/uL — ABNORMAL HIGH (ref 1.7–7.7)
Neutrophils Relative %: 88 %
Platelets: 187 10*3/uL (ref 150–400)
RBC: 4.36 MIL/uL (ref 3.87–5.11)
RDW: 22.7 % — ABNORMAL HIGH (ref 11.5–15.5)
Smear Review: NORMAL
WBC: 11.2 10*3/uL — ABNORMAL HIGH (ref 4.0–10.5)
nRBC: 0 % (ref 0.0–0.2)

## 2024-07-18 LAB — URINE DRUG SCREEN
Amphetamines: NEGATIVE
Barbiturates: NEGATIVE
Benzodiazepines: NEGATIVE
Cocaine: NEGATIVE
Fentanyl: NEGATIVE
Methadone Scn, Ur: NEGATIVE
Opiates: NEGATIVE
Tetrahydrocannabinol: NEGATIVE

## 2024-07-18 LAB — I-STAT CG4 LACTIC ACID, ED
Lactic Acid, Venous: 6.4 mmol/L (ref 0.5–1.9)
Lactic Acid, Venous: 3.9 mmol/L (ref 0.5–1.9)
Lactic Acid, Venous: 1 mmol/L (ref 0.5–1.9)

## 2024-07-18 LAB — ETHANOL: Alcohol, Ethyl (B): <15 mg/dL

## 2024-07-18 LAB — RESP PANEL BY RT-PCR (RSV, FLU A&B, COVID)  RVPGX2
Influenza A by PCR: NEGATIVE
Influenza B by PCR: NEGATIVE
Resp Syncytial Virus by PCR: NEGATIVE
SARS Coronavirus 2 by RT PCR: POSITIVE — AB

## 2024-07-18 LAB — VALPROIC ACID LEVEL: Valproic Acid Lvl: <10 ug/mL — ABNORMAL LOW (ref 50–100)

## 2024-07-18 MED ORDER — LEVETIRACETAM (KEPPRA) 500 MG/5 ML ADULT IV PUSH
500.0000 mg | Freq: Two times a day (BID) | INTRAVENOUS | Status: DC
  Administered 2024-07-19 (×2): 500 mg via INTRAVENOUS
  Filled 2024-07-18 (×3): qty 5

## 2024-07-18 MED ORDER — LEVETIRACETAM (KEPPRA) 500 MG/5 ML ADULT IV PUSH
1500.0000 mg | Freq: Once | INTRAVENOUS | Status: DC
  Filled 2024-07-18: qty 15

## 2024-07-18 MED ORDER — LACTATED RINGERS IV BOLUS
1000.0000 mL | Freq: Once | INTRAVENOUS | Status: AC
  Administered 2024-07-18: 1000 mL via INTRAVENOUS

## 2024-07-18 MED ORDER — THIAMINE MONONITRATE 100 MG PO TABS
100.0000 mg | ORAL_TABLET | Freq: Every day | ORAL | Status: DC
  Administered 2024-07-20 – 2024-07-21 (×2): 100 mg via ORAL
  Filled 2024-07-18 (×2): qty 1

## 2024-07-18 MED ORDER — LEVETIRACETAM (KEPPRA) 500 MG/5 ML ADULT IV PUSH
3500.0000 mg | Freq: Once | INTRAVENOUS | Status: AC
  Administered 2024-07-18: 3500 mg via INTRAVENOUS

## 2024-07-18 MED ORDER — PIPERACILLIN-TAZOBACTAM 3.375 G IVPB 30 MIN
3.3750 g | Freq: Once | INTRAVENOUS | Status: AC
  Administered 2024-07-18: 3.375 g via INTRAVENOUS
  Filled 2024-07-18: qty 50

## 2024-07-18 MED ORDER — DIVALPROEX SODIUM ER 250 MG PO TB24
250.0000 mg | ORAL_TABLET | Freq: Every day | ORAL | Status: DC
  Administered 2024-07-18: 250 mg via ORAL
  Filled 2024-07-18: qty 1

## 2024-07-18 MED ORDER — ACETAMINOPHEN 650 MG RE SUPP
650.0000 mg | Freq: Once | RECTAL | Status: AC
  Administered 2024-07-18: 650 mg via RECTAL
  Filled 2024-07-18: qty 1

## 2024-07-18 MED ORDER — IOHEXOL 350 MG/ML SOLN
75.0000 mL | Freq: Once | INTRAVENOUS | Status: AC | PRN
  Administered 2024-07-18: 75 mL via INTRAVENOUS

## 2024-07-18 MED ORDER — FOLIC ACID 1 MG PO TABS: 1.0000 mg | ORAL_TABLET | Freq: Every day | ORAL | Status: DC

## 2024-07-18 MED ORDER — THIAMINE HCL 100 MG/ML IJ SOLN
100.0000 mg | Freq: Every day | INTRAMUSCULAR | Status: DC
  Administered 2024-07-18 – 2024-07-19 (×2): 100 mg via INTRAVENOUS
  Filled 2024-07-18 (×2): qty 2

## 2024-07-18 MED ORDER — LORAZEPAM 1 MG PO TABS: 1.0000 mg | ORAL_TABLET | ORAL | Status: DC | PRN

## 2024-07-18 MED ORDER — LORAZEPAM 2 MG/ML IJ SOLN: 1.0000 mg | INTRAMUSCULAR | Status: DC | PRN

## 2024-07-18 MED ORDER — LORAZEPAM 2 MG/ML IJ SOLN
INTRAMUSCULAR | Status: AC
  Administered 2024-07-18: 2 mg via INTRAMUSCULAR
  Filled 2024-07-18: qty 1

## 2024-07-18 MED ORDER — VANCOMYCIN HCL 1250 MG/250ML IV SOLN
1250.0000 mg | Freq: Once | INTRAVENOUS | Status: AC
  Administered 2024-07-18: 1250 mg via INTRAVENOUS
  Filled 2024-07-18: qty 250

## 2024-07-18 MED ORDER — LORAZEPAM 2 MG/ML IJ SOLN
INTRAMUSCULAR | Status: AC
  Filled 2024-07-18: qty 1

## 2024-07-18 MED ORDER — LORAZEPAM 2 MG/ML IJ SOLN: 2.0000 mg | Freq: Once | INTRAMUSCULAR | Status: AC

## 2024-07-18 NOTE — Progress Notes (Signed)
 LTM EEG hooked up and running - no initial skin breakdown - push button tested -Not monitored by [facility] while in ER

## 2024-07-18 NOTE — ED Triage Notes (Signed)
 Pt was BIB [facility] from her sisters house with complaints of seizures. She has been with sister for 2  days with no meds. Pt has had 4 seizures today per EMS . Last seen was about 3 minutes, tonic clonic. Upon arrival no seizure activity. Just postictal. No fall/ Injury per family  170/100 120 HR  90% RA 98% 3L 170 CBG

## 2024-07-18 NOTE — Consult Note (Addendum)
 NEUROLOGY CONSULT NOTE   Date of service: July 18, 2024 Patient Name: [name] MRN:  [id number] DOB:  [birth year] Chief Complaint: Seizures Requesting Provider: Dreama Longs, MD  History of Present Illness  [name] is a 59 y.o. female with hx of seizure disorder with noncompliance to medications, substance abuse, alcohol  abuse, depression, GERD, hypothyroidism, hyperlipidemia, COPD on home oxygen , bipolar disorder, history of Roux-en-Y surgery and A-fib who presents with breakthrough seizures at home.  She was staying with her daughter due to the weather and had not brought her seizure medication with her.  Her daughter cherish, who manages her medications states that patient is often noncompliant with her seizure medications and drinks alcohol  on a daily basis.  Daughter states that patient is supposed to be on both Keppra  and Depakote  at home.  Patient's daughter reports that she was called by her sister this morning at 54 stating that patient was having a seizure consisting of blinking her eyes and rocking back and forward on the floor tearing up tissue paper in a rhythmic fashion.  She reportedly had 2 more witnessed seizures at home as well as a seizure when EMS arrived.   ROS   Unable to ascertain due to altered mental status  Past History   Past Medical History:  Diagnosis Date   Abdominal pain    Accidental drug overdose 09/2011   Anxiety    Atrial fibrillation (HCC) 09/29/2011   converted spontaneously   Chronic back pain    Chronic knee pain    Chronic nausea    Chronic pain    COPD (chronic obstructive pulmonary disease) (HCC)    Depression    Diabetes mellitus    states her doctor took her off all DM meds in past month   Diabetic neuropathy (HCC)    Dyspnea    with exertion    GERD (gastroesophageal reflux disease)    Headache(784.0)    migraines    HTN (hypertension)    not on meds since in a year    Hyperlipidemia    Hypothyroidism    not  on meds in a while    Mental disorder    Bipolar and schizophrenic   Nausea and vomiting 01/02/2023   Requires supplemental oxygen     as needed per patient    Schizophrenia (HCC)    Schizophrenia, acute (HCC) 11/13/2017   Tobacco abuse     Past Surgical History:  Procedure Laterality Date   ABDOMINAL HYSTERECTOMY     BLADDER SUSPENSION  03/04/2011   Procedure: [facility] PROCEDURE;  Surgeon: Glendia LABOR MacDiarmid;  Location: [facility];  Service: Urology;  Laterality: N/A;   BOWEL RESECTION N/A 04/18/2022   Procedure: SMALL BOWEL RESECTION;  Surgeon: Vanderbilt Ned, MD;  Location: [facility] OR;  Service: General;  Laterality: N/A;   COLONOSCOPY N/A 06/02/2024   Procedure: COLONOSCOPY;  Surgeon: Leigh Elspeth SQUIBB, MD;  Location: [facility] ENDOSCOPY;  Service: Gastroenterology;  Laterality: N/A;   CYSTOCELE REPAIR  03/04/2011   Procedure: ANTERIOR REPAIR (CYSTOCELE);  Surgeon: Glendia LABOR Elizabeth;  Location: [facility];  Service: Urology;  Laterality: N/A;   CYSTOSCOPY  03/04/2011   Procedure: CYSTOSCOPY;  Surgeon: Glendia LABOR MacDiarmid;  Location: [facility];  Service: Urology;  Laterality: N/A;   ESOPHAGOGASTRODUODENOSCOPY N/A 06/02/2024   Procedure: EGD (ESOPHAGOGASTRODUODENOSCOPY);  Surgeon: Leigh Elspeth SQUIBB, MD;  Location: [facility] ENDOSCOPY;  Service: Gastroenterology;  Laterality: N/A;   ESOPHAGOGASTRODUODENOSCOPY (EGD) WITH PROPOFOL  N/A 05/12/2017   Procedure: ESOPHAGOGASTRODUODENOSCOPY (EGD) WITH PROPOFOL ;  Surgeon: Ethyl Lenis, MD;  Location: [facility];  Service: General;  Laterality: N/A;   GASTRIC ROUX-EN-Y N/A 03/25/2016   Procedure: LAPAROSCOPIC ROUX-EN-Y GASTRIC BYPASS WITH UPPER ENDOSCOPY;  Surgeon: Morene Olives, MD;  Location: [facility];  Service: General;  Laterality: N/A;   KNEE SURGERY     LAPAROSCOPIC ASSISTED VAGINAL HYSTERECTOMY  03/04/2011   Procedure: LAPAROSCOPIC ASSISTED VAGINAL HYSTERECTOMY;  Surgeon: Rosaline LITTIE Cobble, MD;  Location: [facility];  Service: Gynecology;  Laterality: N/A;    LAPAROTOMY N/A 04/18/2022   Procedure: EXPLORATORY LAPAROTOMY;  Surgeon: Vanderbilt Ned, MD;  Location: [facility];  Service: General;  Laterality: N/A;   LAPAROTOMY N/A 04/24/2022   Procedure: BRING BACK EXPLORATORY LAPAROTOMY;  Surgeon: Sebastian Moles, MD;  Location: [facility] OR;  Service: General;  Laterality: N/A;   TOTAL HIP ARTHROPLASTY Right 08/27/2022   Procedure: TOTAL HIP ARTHROPLASTY;  Surgeon: Edna Toribio LABOR, MD;  Location: [facility] OR;  Service: Orthopedics;  Laterality: Right;    Family History: Family History  Problem Relation Age of Onset   Diabetes Mother    Heart disease Mother    Hypertension Mother    Heart attack Father        24s   Heart attack Sister        55   COPD Other    Breast cancer Neg Hx     Social History  reports that she has been smoking cigarettes. She has never used smokeless tobacco. She reports current alcohol  use. She reports current drug use. Drugs: Cocaine, Marijuana, and Crack cocaine.  Allergies[1]  Medications  Current Medications[2]  Vitals   Vitals:   2024/08/11 1509 08/11/24 1538 08/11/24 1545  BP:   (!) 121/98  Pulse:  (!) 125 (!) 126  Resp:  (!) 26 (!) 29  Temp:  (!) 100.4 F (38 C)   TempSrc:  Oral   SpO2:  100% 100%  Weight: 58.9 kg    Height: 5' 1 (1.549 m)      Body mass index is 24.54 kg/m.   Physical Exam   Constitutional: Appears well-developed and well-nourished.  Psych: Affect blunted Eyes: No scleral injection.  HENT: No OP obstruction.  Head: Normocephalic.  Respiratory: Effort normal, non-labored breathing.  Skin: WDI.   Neurologic Examination    NEURO:  Mental Status: Patient rests with eyes open and does not answer questions and to vigorous stimuli are applied.  She is able to state her name but can answer no other questions and does not follow commands Speech/Language: Patient only able to state her name without dysarthria  Cranial Nerves:  II: PERRL. III, IV, VI: EOMI. Eyelids elevate  symmetrically.  VII: Aces symmetrical resting and speaking VIII: hearing intact to voice. IX, X:Phonation is normal.  XII: Not cooperative with tongue protrusion Motor: Moves bilateral upper extremities with antigravity strength, moves bilateral lower extremities but does not lift them off the bed Tone: is normal and bulk is normal Sensation- Intact to light touch bilaterally.   Coordination: Unable to perform Gait- deferred   Labs/Imaging/Neurodiagnostic studies   CBC:  Recent Labs  Lab 2024-08-11 1530 08/11/24 1538  WBC 11.2*  --   NEUTROABS 9.8*  --   HGB 11.1* 13.6  HCT 35.6* 40.0  MCV 81.7  --   PLT 187  --    Basic Metabolic Panel:  Lab Results  Component Value Date   NA 132 (L) 11-Aug-2024   K 4.1 08-11-2024   CO2 22 06/16/2024   GLUCOSE 141 (H) August 11, 2024  BUN 5 (L) 07/18/2024   CREATININE 0.60 07/18/2024   CALCIUM  8.1 (L) 06/16/2024   GFRNONAA >60 06/16/2024   GFRAA >60 08/03/2018   Lipid Panel:  Lab Results  Component Value Date   LDLCALC 59 02/05/2024   HgbA1c:  Lab Results  Component Value Date   HGBA1C 6.2 (H) 06/08/2024   Urine Drug Screen:     Component Value Date/Time   LABOPIA NEGATIVE 06/15/2024 1340   COCAINSCRNUR NEGATIVE 06/15/2024 1340   COCAINSCRNUR Positive (A) 10/10/2020 1319   LABBENZ POSITIVE (A) 06/15/2024 1340   AMPHETMU NEGATIVE 06/15/2024 1340   THCU NEGATIVE 06/15/2024 1340   LABBARB NEGATIVE 06/15/2024 1340    Alcohol  Level     Component Value Date/Time   ETH <15 06/15/2024 1340   INR  Lab Results  Component Value Date   INR 0.9 06/08/2024   APTT  Lab Results  Component Value Date   APTT 25 12/16/2022   AED levels:  Lab Results  Component Value Date   LEVETIRACETA 53.9 (H) 06/15/2024    CT Head without contrast(Personally reviewed): Pending  Neurodiagnostics Continuous EEG:  Pending  ASSESSMENT   GIAVANNI ZEITLIN is a 59 y.o. female with hx of seizure disorder with noncompliance to medications,  substance abuse, alcohol  abuse, depression, GERD, hypothyroidism, hyperlipidemia, COPD on home oxygen , bipolar disorder, history of Roux-en-Y surgery and A-fib who presents with 3 breakthrough seizures followed by a seizure witnessed by EMS and another seizure in the emergency department.  Per patient's daughter, patient is supposed to be on Keppra  and Depakote  but often is noncompliant with her medications and drinks alcohol  on a daily basis.  Daughter states the patient has gone into alcohol  withdrawal in the past.  On exam, she is noted to be postictal and is only able to state her name when vigorous stimuli are applied.  She was noted to be febrile and is being treated for infection appropriately with vancomycin  and Zosyn .  She has been loaded with Keppra  and was given lorazepam  to terminate seizure in the emergency department.  Will place patient on LTM EEG and resume home doses of Keppra  and Depakote .  RECOMMENDATIONS  -LTM EEG - Continue Keppra  500 mg twice daily - Continue Depakote  250 mg daily - Seizure precautions - Initiate CIWA protocol - Neurology will continue to follow ______________________________________________________________________  Patient seen by NP and then by MD, MD to addend note as needed.  Signed, Cortney E Everitt Clint Kill, NP [facility]     Attending addendum Patient seen and examined independently History obtained apparently Patient will require much history Likely breakthrough seizure in the setting of noncompliance to medications Already loaded with Keppra  Continue Keppra  500 twice daily CIWA protocol She is on Depakote  250 daily-seems like a dose for mood stabilization-level undetectable. For me, she was following some commands, EEG at some point would be helpful to ensure that she is not in status.  Clinically she looks better than what she looked for the ED provider a few hours ago.  Neurology will follow Primary plan was discussed with Dr.  Laurice  -- Eligio Lav, MD Neurologist [facility]      [1]  Allergies Allergen Reactions   Iron  Dextran Shortness Of Breath and Anxiety   Aspirin  Nausea And Vomiting and Other (See Comments)    Ok to take tylenol  or ibuprofen     Metformin  Hcl Er Diarrhea and Other (See Comments)    Gi issues with the 750 mg ER version   Penicillins  Other (See Comments)    Unknown reaction from childhood: family would like this to remain as an allergy.  Tolerated cephalosporins and zosyn   [2]  Current Facility-Administered Medications:    divalproex  (DEPAKOTE  ER) 24 hr tablet 250 mg, 250 mg, Oral, Daily, de Clint Kill, Florence E, NP   [START ON 07/19/2024] levETIRAcetam  (KEPPRA ) undiluted injection 500 mg, 500 mg, Intravenous, Q12H, de La Torre, Cortney E, NP   LORazepam  (ATIVAN ) 2 MG/ML injection, , , ,    vancomycin  (VANCOREADY) IVPB 1250 mg/250 mL, 1,250 mg, Intravenous, Once, Messick, Maude BROCKS, MD  Current Outpatient Medications:    albuterol  (VENTOLIN  HFA) 108 (90 Base) MCG/ACT inhaler, Inhale 2 puffs into the lungs every 4 (four) hours as needed for wheezing or shortness of breath., Disp: 18 g, Rfl: 1   Blood Glucose Monitoring Suppl (BLOOD GLUCOSE MONITOR SYSTEM) w/Device KIT, Use up to four times daily as directed., Disp: 1 kit, Rfl: 0   divalproex  (DEPAKOTE  ER) 250 MG 24 hr tablet, Take 1 tablet (250 mg total) by mouth daily., Disp: 90 tablet, Rfl: 1   folic acid  (FOLVITE ) 1 MG tablet, Take 1 tablet (1 mg total) by mouth daily., Disp: 30 tablet, Rfl: 0   Glucose Blood (BLOOD GLUCOSE TEST STRIPS) STRP, Use up to four times daily as directed., Disp: 100 strip, Rfl: 0   Ibuprofen  200 MG CAPS, Take 200-400 mg by mouth every 6 (six) hours as needed (for pain)., Disp: , Rfl:    Lancet Device MISC, Use up to four times daily as directed. (FOR ICD-10 E10.9, E11.9)., Disp: 1 each, Rfl: 0   Lancets MISC, Use up to four times daily as directed, Disp: 100 each, Rfl: 0   levETIRAcetam   (KEPPRA ) 500 MG tablet, Take 1 tablet (500 mg total) by mouth 2 (two) times daily., Disp: 60 tablet, Rfl: 0   lidocaine  (LIDODERM ) 5 %, Place 1 patch onto the skin daily as needed (for pain- Remove & Discard patch within 12 hours or as directed by MD)., Disp: , Rfl:    metFORMIN  (GLUCOPHAGE ) 500 MG tablet, Take 500 mg by mouth daily with breakfast., Disp: , Rfl:    ondansetron  (ZOFRAN -ODT) 4 MG disintegrating tablet, Dissolve 1 tablet (4 mg total) by mouth every 8 (eight) hours as needed for nausea or vomiting. (Patient taking differently: Take 4 mg by mouth every 8 (eight) hours as needed for nausea or vomiting (dissolve orally).), Disp: 20 tablet, Rfl: 0   OXYGEN , Inhale 4-5 L/min into the lungs See admin instructions. Inhale 4-5 L/min into the lungs at bedtime and during the day as needed for shortness of breath, Disp: , Rfl:    pantoprazole  (PROTONIX ) 40 MG tablet, Take 1 tablet (40 mg total) by mouth daily., Disp: 30 tablet, Rfl: 2   thiamine  (VITAMIN B1) 100 MG tablet, Take 1 tablet (100 mg total) by mouth daily. (Patient not taking: Reported on 06/09/2024), Disp: 30 tablet, Rfl: 1   traZODone  (DESYREL ) 50 MG tablet, Take 1 tablet (50 mg total) by mouth at bedtime as needed for sleep., Disp: , Rfl:

## 2024-07-18 NOTE — Progress Notes (Signed)
 ED Pharmacy Antibiotic Sign Off An antibiotic consult was received from an ED provider for vancomycin  per pharmacy dosing for sepsis. A chart review was completed to assess appropriateness.   The following one time order(s) were placed:  Vancomycin  1250 mg IV x 1  Further antibiotic and/or antibiotic pharmacy consults should be ordered by the admitting provider if indicated.   Thank you for allowing pharmacy to be a part of this patient's care.   Dorn Poot, [facility]  Clinical Pharmacist 07/18/24 3:42 PM

## 2024-07-18 NOTE — ED Provider Notes (Signed)
 " [place] [facility] Provider Note   CSN: [id number] Arrival date & time: 07/18/24  1501     Patient presents with: No chief complaint on file.   [name] is a 59 y.o. female.  {Add pertinent medical, surgical, social history, OB history to HPI:2735} 59 year old female with prior medical history as detailed below presents for evaluation.  Patient arrives with EMS transport from her sister's house.  Patient had been staying with her sister because of the weather.  Apparently she did not bring her medications with her.  EMS reports that the patient has been off of her medications for at least the last 2 or 3 days.  Patient apparently had several seizures over the course of the morning.  Family reports 4 total seizures.  She has been postictal since the last seizure.  EMS reports gradual improvement in level of alertness during transport.  Patient with reported generalized tonic-clonic seizures.  EMS did not administer any medications during transport.  No reported recent alcohol  use or recent cessation of alcohol .  See excerpt below from recent discharge summary last month:  HPI: [name] is a 59 y.o. female with medical history significant of patient with history of seizure disorder, polysubstance abuse including alcohol , cocaine, depression, GERD, hypothyroidism, hyperlipidemia, COPD on 2 to 3 L of oxygen  at night at home, bipolar disorder, history of Roux-en-Y surgery chronic pain, atrial fibrillation type 2 diabetes who was at a friend's house when he had 3 witnessed seizures.  EMS arrived and he had 1 episode of seizure also in their presence.  They gave him 5 mg of midazolam  and brought him to the ER.  In the ER he was noted to have A-fib with RVR initially, patient remained postictal with confusion.  Neurology consulted and recommended EEG and they will follow.  He is arousable.  The dose of his Keppra  was increased from 250 mg of 500 mg.   He was recently discharged from the hospital on the 19th when he was admitted and was intractable nausea and vomiting from alcohol  induced gastritis.  No evidence of alcohol  withdrawal at the time.  At this point patient has been admitted with seizure disorder probably due to his known seizures or due to alcohol  withdrawal.    Subjective: Patient seen and examined, she is fully alert and oriented, she has no complaints.  She was able to answer all the questions appropriately about her COPD, oxygen  demand who she lives with, family members and everything.  Discussed plan of discharge with her and she is wanting to go home.   Brief/Interim Summary: Patient was admitted overnight for breakthrough seizures.  Patient was seen by neurology.  According to the documentation, patient's Keppra  was recently reduced to 250 mg twice daily on December 25.  Keppra  level is pending at the time of discharge.  UDS was negative.  Valproic  acid is also pending.  Patient was loaded with Keppra  in the ED.  Neurology recommended increased dose of Keppra  500 mg p.o. twice daily and recommended discharging the patient since she is back at baseline.  I have also discussed the discharge plan with the daughter [name] over the phone who is in agreement with the discharge as well.  Patient has COPD, uses 4 to 5 L of oxygen , she has no dyspnea, lungs are clear to auscultation, she is at her baseline oxygen  requirement due to chronic hypoxic respiratory failure.  Resuming all the home medications.  Discharge plan was discussed with patient and/or family member and they verbalized understanding and agreed with it.  Discharge Diagnoses:  Active Problems:   COPD (chronic obstructive pulmonary disease) (HCC)   Chronic respiratory failure with hypoxia (HCC)   Alcohol  use disorder, severe, dependence (HCC)   GERD with esophagitis   Schizoaffective disorder (HCC)   Tobacco abuse   Type 2 diabetes mellitus (HCC)   Continuous dependence  on cigarette smoking   Seizure (HCC)   Polysubstance abuse (HCC)   The history is provided by the patient and medical records.       Prior to Admission medications  Medication Sig Start Date End Date Taking? Authorizing Provider  albuterol  (VENTOLIN  HFA) 108 (90 Base) MCG/ACT inhaler Inhale 2 puffs into the lungs every 4 (four) hours as needed for wheezing or shortness of breath. 06/10/24   Ghimire, Kuber, MD  Blood Glucose Monitoring Suppl (BLOOD GLUCOSE MONITOR SYSTEM) w/Device KIT Use up to four times daily as directed. 06/10/24   [name] Coria, MD  divalproex  (DEPAKOTE  ER) 250 MG 24 hr tablet Take 1 tablet (250 mg total) by mouth daily. 05/28/24   [name] Lavonia SAILOR, MD  folic acid  (FOLVITE ) 1 MG tablet Take 1 tablet (1 mg total) by mouth daily. 06/04/24   Rashid, Farhan, MD  Glucose Blood (BLOOD GLUCOSE TEST STRIPS) STRP Use up to four times daily as directed. 06/10/24   Ghimire, Kuber, MD  Ibuprofen  200 MG CAPS Take 200-400 mg by mouth every 6 (six) hours as needed (for pain).    [provider]  Lancet Device MISC Use up to four times daily as directed. (FOR ICD-10 E10.9, E11.9). 06/10/24   [name] Coria, MD  Lancets MISC Use up to four times daily as directed 06/10/24   [name] Coria, MD  levETIRAcetam  (KEPPRA ) 500 MG tablet Take 1 tablet (500 mg total) by mouth 2 (two) times daily. 06/16/24 07/16/24  [name] Ranks, MD  lidocaine  (LIDODERM ) 5 % Place 1 patch onto the skin daily as needed (for pain- Remove & Discard patch within 12 hours or as directed by MD).    [provider]  metFORMIN  (GLUCOPHAGE ) 500 MG tablet Take 500 mg by mouth daily with breakfast.    [provider]  ondansetron  (ZOFRAN -ODT) 4 MG disintegrating tablet Dissolve 1 tablet (4 mg total) by mouth every 8 (eight) hours as needed for nausea or vomiting. Patient taking differently: Take 4 mg by mouth every 8 (eight) hours as needed for nausea or vomiting (dissolve orally). 05/25/24   Tegeler,  Lonni PARAS, MD  OXYGEN  Inhale 4-5 L/min into the lungs See admin instructions. Inhale 4-5 L/min into the lungs at bedtime and during the day as needed for shortness of breath    [provider]  pantoprazole  (PROTONIX ) 40 MG tablet Take 1 tablet (40 mg total) by mouth daily. 06/10/24   [name] Coria, MD  thiamine  (VITAMIN B1) 100 MG tablet Take 1 tablet (100 mg total) by mouth daily. Patient not taking: Reported on 06/09/2024 05/28/24   [name] Lavonia SAILOR, MD  traZODone  (DESYREL ) 50 MG tablet Take 1 tablet (50 mg total) by mouth at bedtime as needed for sleep. 06/03/24   Rashid, Farhan, MD    Allergies: Iron  dextran, Aspirin , Metformin  hcl er, and Penicillins    Review of Systems  All other systems reviewed and are negative.   Updated Vital Signs LMP 01/08/2011   Physical Exam Vitals and nursing note reviewed.  Constitutional:      General: She  is not in acute distress.    Appearance: Normal appearance. She is well-developed.     Comments: Alert but slow to respond.  Appears postictal.  HENT:     Head: Normocephalic and atraumatic.     Comments: Patient seen shortly after arrival.  Patient with significant dental decay.  During seizure, a crunching noise was heard from the patient's mouth.  A loose tooth was found in the right buccal mucosa, this was gently extracted by myself.  Obviously, given the patient's poor dentition I am concerned about the possibility of aspiration or swallowing of other loose teeth. Eyes:     Conjunctiva/sclera: Conjunctivae normal.  Cardiovascular:     Rate and Rhythm: Normal rate and regular rhythm.     Heart sounds: No murmur heard. Pulmonary:     Effort: Pulmonary effort is normal. No respiratory distress.     Breath sounds: Normal breath sounds.  Abdominal:     Palpations: Abdomen is soft.     Tenderness: There is no abdominal tenderness.  Musculoskeletal:        General: No swelling.     Cervical back: Neck supple.  Skin:    General:  Skin is warm and dry.     Capillary Refill: Capillary refill takes less than 2 seconds.  Neurological:     General: No focal deficit present.     Mental Status: She is alert.  Psychiatric:        Mood and Affect: Mood normal.     (all labs ordered are listed, but only abnormal results are displayed) Labs Reviewed  CBC WITH DIFFERENTIAL/PLATELET  ETHANOL  COMPREHENSIVE METABOLIC PANEL WITH GFR  URINE DRUG SCREEN  URINALYSIS, W/ REFLEX TO CULTURE (INFECTION SUSPECTED)  LEVETIRACETAM  LEVEL  VALPROIC  ACID LEVEL  I-STAT CHEM 8, ED  I-STAT CG4 LACTIC ACID, ED    EKG: None  Radiology: No results found.  {Document cardiac monitor, telemetry assessment procedure when appropriate:32947} Procedures   Medications Ordered in the ED  levETIRAcetam  (KEPPRA ) undiluted injection 1,500 mg (has no administration in time range)      {Click here for ABCD2, HEART and other calculators REFRESH Note before signing:1}                              Medical Decision Making Patient presents with multiple seizures reported at home.  EMS reports that patient is postictal but gradually improving with mental status during transport.  Patient with 4 observed seizures prior to EMS arrival.  EMS reports the patient has not been taking her medications for the last several days.  Shortly after arrival patient had another seizure.  This lasted about 1 minute.  Patient dislodged a tooth during the seizure and this was removed from her right buccal space by myself.  I do not think she aspirated any other loose teeth during this particular seizure.  Patient is noted to be febrile.  Given multiple seizures, Keppra  load administered.  Additionally patient was given 2 mg Ativan .  Extensive workup initiated for workup of fever, suspected status epilepticus.  Broad-spectrum antibiotics -vancomycin  and Zosyn  administered.  Neurology is aware of case.  They will consult.  Patient will require admission.   Oncoming ED provider is aware of case.  Amount and/or Complexity of Data Reviewed Labs: ordered. Radiology: ordered.  Risk Prescription drug management.   CRITICAL CARE Performed by: Maude JAYSON Galloway   Total critical care time: 30 minutes  Critical care  time was exclusive of separately billable procedures and treating other patients.  Critical care was necessary to treat or prevent imminent or life-threatening deterioration.  Critical care was time spent personally by me on the following activities: development of treatment plan with patient and/or surrogate as well as nursing, discussions with consultants, evaluation of patient's response to treatment, examination of patient, obtaining history from patient or surrogate, ordering and performing treatments and interventions, ordering and review of laboratory studies, ordering and review of radiographic studies, pulse oximetry and re-evaluation of patient's condition.         {Document critical care time when appropriate  Document review of labs and clinical decision tools ie CHADS2VASC2, etc  Document your independent review of radiology images and any outside records  Document your discussion with family members, caretakers and with consultants  Document social determinants of health affecting pt's care  Document your decision making why or why not admission, treatments were needed:32947:::1}   Final diagnoses:  Fever, unspecified fever cause  Seizures [facility])    ED Discharge Orders     None        "

## 2024-07-18 NOTE — ED Notes (Signed)
 MD made aware of improving lactic.

## 2024-07-18 NOTE — ED Notes (Signed)
 Both sets of Blood Cultures sent to lab.

## 2024-07-18 NOTE — ED Provider Notes (Signed)
" °  Physical Exam  BP (!) 121/98   Pulse (!) 126   Temp (!) 100.4 F (38 C) (Oral)   Resp (!) 29   Ht 5' 1 (1.549 m)   Wt 58.9 kg   LMP 01/08/2011   SpO2 100%   BMI 24.54 kg/m   Physical Exam  Procedures  Procedures  ED Course / MDM    Medical Decision Making Amount and/or Complexity of Data Reviewed Labs: ordered. Radiology: ordered.  Risk OTC drugs. Prescription drug management. Decision regarding hospitalization.   Care of patient from Dr. Laurice.  Please see his note for prior history, physical and care.  Briefly this is a 59-year-old female with a history of seizure disorder with nonadherence to medications, history of substance abuse, alcohol  abuse, depression, GERD, hypothyroidism, hyperlipidemia, COPD on home oxygen  4 to 5 L, bipolar disorder, history of Roux-en-Y surgery, atrial fibrillation, who presents to the emergency department with concern for 4 seizures today in the setting of her staying with her family due to weather and forgot to bring her seizure medications.  Per Dr. Laurice --4 seizures today, seizure here in the ED after arrival.  Given keppra , ativan , zosyn , vanc given low-grade fever.  Concern for possible aspiration in the setting of multiple seizures..  Ordered CT head, CT chest abdomen pelvis the setting of possible infection and question foreign body on x-ray.  She did have a tooth that had come out with her seizure that he retrieved . Neurology consulted.   Initial lactic acid is 6.4, likely in the setting of seizure, repeat is 3.9.  I have ordered IV fluids in the setting of tachycardia.  Personally evaluated other labs which are consistent with the anion gap metabolic acidosis secondary to lactic acidosis in the setting of seizure, mild leukocytosis, negative alcohol  level also noted to be negative on multiple prior evaluations since August.  Recent admission discharge summary reports she did not have withdrawal at that time.  Is returning to baseline,  not tremulous at time of my exam, and suspect seizures secondary to nonadherence to medication less likely due to alcohol  withdrawal although will order CIWA, protocol.  COVID, influenza and RSV testing were done and were positive for COVID.  Feel this is likely the source of her low-grade temperature and also may be contributing to a lower seizure threshold.  CT head and CT c/a/p completed and show FB in distal esophagus.  Given her AMS it is difficult to assess her symptoms from this , however she is not drooling or complaining of pain in this location during my evaluation. She is not able to answer whether she swallowed something, lesion does not have appearance of button battery on imaging.  Discussed with Dr. Albertus GI-recommends NPO, repeat XR in AM and possible endoscopy in AM.   Admitted to the hospital for continued evaluation and treatment in the setting of seizures, COVID, esophageal foreign body.        Dreama Longs, MD 07/19/24 1236  "

## 2024-07-18 NOTE — TOC CM/SW Note (Signed)
 TOC consult received for substance abuse. Follow-up to be completed with patient as appropriate.    Merilee Batty, MSN, RN Case Management [phone]

## 2024-07-18 NOTE — ED Notes (Signed)
 Patient was given depakote  per order. She started coughing excessively after drinking water . Notified MD SLP possibly needed.

## 2024-07-18 NOTE — ED Notes (Signed)
 Pt put on bedside monitor as she attempts to get out of bed repeatedly. Redirection attempts currently fail.

## 2024-07-18 NOTE — ED Notes (Signed)
 Waiting on vanc from pharmacy. Message was sent.

## 2024-07-18 NOTE — Progress Notes (Signed)
 LTM maint complete - serviced Pz and Fp1 Placed head wrap. Not monitored by [facility] while in ER.

## 2024-07-19 ENCOUNTER — Observation Stay (HOSPITAL_COMMUNITY)

## 2024-07-19 ENCOUNTER — Inpatient Hospital Stay (HOSPITAL_COMMUNITY)

## 2024-07-19 ENCOUNTER — Encounter (HOSPITAL_COMMUNITY): Payer: Self-pay | Admitting: Certified Registered Nurse Anesthetist

## 2024-07-19 ENCOUNTER — Encounter (HOSPITAL_COMMUNITY): Admission: EM | Disposition: A | Payer: Self-pay | Source: Home / Self Care

## 2024-07-19 DIAGNOSIS — R935 Abnormal findings on diagnostic imaging of other abdominal regions, including retroperitoneum: Secondary | ICD-10-CM | POA: Diagnosis not present

## 2024-07-19 DIAGNOSIS — F259 Schizoaffective disorder, unspecified: Secondary | ICD-10-CM | POA: Diagnosis present

## 2024-07-19 DIAGNOSIS — F1010 Alcohol abuse, uncomplicated: Secondary | ICD-10-CM

## 2024-07-19 DIAGNOSIS — Z8659 Personal history of other mental and behavioral disorders: Secondary | ICD-10-CM | POA: Diagnosis not present

## 2024-07-19 DIAGNOSIS — Z9981 Dependence on supplemental oxygen: Secondary | ICD-10-CM | POA: Diagnosis not present

## 2024-07-19 DIAGNOSIS — F10239 Alcohol dependence with withdrawal, unspecified: Secondary | ICD-10-CM

## 2024-07-19 DIAGNOSIS — F17210 Nicotine dependence, cigarettes, uncomplicated: Secondary | ICD-10-CM | POA: Diagnosis present

## 2024-07-19 DIAGNOSIS — I10 Essential (primary) hypertension: Secondary | ICD-10-CM | POA: Diagnosis present

## 2024-07-19 DIAGNOSIS — T18108A Unspecified foreign body in esophagus causing other injury, initial encounter: Secondary | ICD-10-CM | POA: Diagnosis present

## 2024-07-19 DIAGNOSIS — E1140 Type 2 diabetes mellitus with diabetic neuropathy, unspecified: Secondary | ICD-10-CM | POA: Diagnosis present

## 2024-07-19 DIAGNOSIS — E860 Dehydration: Secondary | ICD-10-CM | POA: Diagnosis present

## 2024-07-19 DIAGNOSIS — U071 COVID-19: Secondary | ICD-10-CM | POA: Diagnosis present

## 2024-07-19 DIAGNOSIS — T426X6A Underdosing of other antiepileptic and sedative-hypnotic drugs, initial encounter: Secondary | ICD-10-CM | POA: Diagnosis not present

## 2024-07-19 DIAGNOSIS — G40909 Epilepsy, unspecified, not intractable, without status epilepticus: Secondary | ICD-10-CM | POA: Diagnosis present

## 2024-07-19 DIAGNOSIS — E872 Acidosis, unspecified: Secondary | ICD-10-CM | POA: Diagnosis present

## 2024-07-19 DIAGNOSIS — T189XXA Foreign body of alimentary tract, part unspecified, initial encounter: Secondary | ICD-10-CM | POA: Diagnosis not present

## 2024-07-19 DIAGNOSIS — R569 Unspecified convulsions: Secondary | ICD-10-CM | POA: Diagnosis present

## 2024-07-19 DIAGNOSIS — Z79899 Other long term (current) drug therapy: Secondary | ICD-10-CM | POA: Diagnosis not present

## 2024-07-19 DIAGNOSIS — Z91199 Patient's noncompliance with other medical treatment and regimen due to unspecified reason: Secondary | ICD-10-CM | POA: Diagnosis not present

## 2024-07-19 DIAGNOSIS — Z91148 Patient's other noncompliance with medication regimen for other reason: Secondary | ICD-10-CM | POA: Diagnosis not present

## 2024-07-19 DIAGNOSIS — D509 Iron deficiency anemia, unspecified: Secondary | ICD-10-CM | POA: Diagnosis present

## 2024-07-19 DIAGNOSIS — E785 Hyperlipidemia, unspecified: Secondary | ICD-10-CM | POA: Diagnosis present

## 2024-07-19 DIAGNOSIS — W449XXA Unspecified foreign body entering into or through a natural orifice, initial encounter: Secondary | ICD-10-CM | POA: Diagnosis present

## 2024-07-19 DIAGNOSIS — J9611 Chronic respiratory failure with hypoxia: Secondary | ICD-10-CM | POA: Diagnosis present

## 2024-07-19 DIAGNOSIS — Z8249 Family history of ischemic heart disease and other diseases of the circulatory system: Secondary | ICD-10-CM | POA: Diagnosis not present

## 2024-07-19 DIAGNOSIS — F319 Bipolar disorder, unspecified: Secondary | ICD-10-CM | POA: Diagnosis present

## 2024-07-19 DIAGNOSIS — Z9884 Bariatric surgery status: Secondary | ICD-10-CM | POA: Diagnosis not present

## 2024-07-19 DIAGNOSIS — Z833 Family history of diabetes mellitus: Secondary | ICD-10-CM | POA: Diagnosis not present

## 2024-07-19 DIAGNOSIS — E039 Hypothyroidism, unspecified: Secondary | ICD-10-CM | POA: Diagnosis present

## 2024-07-19 DIAGNOSIS — Z7984 Long term (current) use of oral hypoglycemic drugs: Secondary | ICD-10-CM | POA: Diagnosis not present

## 2024-07-19 DIAGNOSIS — T189XXS Foreign body of alimentary tract, part unspecified, sequela: Secondary | ICD-10-CM | POA: Diagnosis not present

## 2024-07-19 DIAGNOSIS — F603 Borderline personality disorder: Secondary | ICD-10-CM | POA: Diagnosis present

## 2024-07-19 DIAGNOSIS — F1410 Cocaine abuse, uncomplicated: Secondary | ICD-10-CM | POA: Diagnosis present

## 2024-07-19 DIAGNOSIS — T182XXA Foreign body in stomach, initial encounter: Secondary | ICD-10-CM | POA: Diagnosis present

## 2024-07-19 DIAGNOSIS — J449 Chronic obstructive pulmonary disease, unspecified: Secondary | ICD-10-CM | POA: Diagnosis present

## 2024-07-19 DIAGNOSIS — I4891 Unspecified atrial fibrillation: Secondary | ICD-10-CM | POA: Diagnosis present

## 2024-07-19 LAB — TROPONIN T, HIGH SENSITIVITY
Troponin T High Sensitivity: 96 ng/L — ABNORMAL HIGH (ref 0–19)
Troponin T High Sensitivity: 75 ng/L — ABNORMAL HIGH (ref 0–19)

## 2024-07-19 LAB — CBC
HCT: 35.1 % — ABNORMAL LOW (ref 36.0–46.0)
Hemoglobin: 11.6 g/dL — ABNORMAL LOW (ref 12.0–15.0)
MCH: 25.3 pg — ABNORMAL LOW (ref 26.0–34.0)
MCHC: 33 g/dL (ref 30.0–36.0)
MCV: 76.6 fL — ABNORMAL LOW (ref 80.0–100.0)
Platelets: 170 10*3/uL (ref 150–400)
RBC: 4.58 MIL/uL (ref 3.87–5.11)
RDW: 22 % — ABNORMAL HIGH (ref 11.5–15.5)
WBC: 7.8 10*3/uL (ref 4.0–10.5)
nRBC: 0 % (ref 0.0–0.2)

## 2024-07-19 LAB — GLUCOSE, CAPILLARY
Glucose-Capillary: 156 mg/dL — ABNORMAL HIGH (ref 70–99)
Glucose-Capillary: 185 mg/dL — ABNORMAL HIGH (ref 70–99)
Glucose-Capillary: 48 mg/dL — ABNORMAL LOW (ref 70–99)
Glucose-Capillary: 266 mg/dL — ABNORMAL HIGH (ref 70–99)
Glucose-Capillary: 207 mg/dL — ABNORMAL HIGH (ref 70–99)

## 2024-07-19 LAB — CBG MONITORING, ED: Glucose-Capillary: 137 mg/dL — ABNORMAL HIGH (ref 70–99)

## 2024-07-19 LAB — BASIC METABOLIC PANEL WITH GFR
Anion gap: 13 (ref 5–15)
BUN: <5 mg/dL — ABNORMAL LOW (ref 6–20)
CO2: 27 mmol/L (ref 22–32)
Calcium: 8.4 mg/dL — ABNORMAL LOW (ref 8.9–10.3)
Chloride: 97 mmol/L — ABNORMAL LOW (ref 98–111)
Creatinine, Ser: 0.55 mg/dL (ref 0.44–1.00)
GFR, Estimated: >60 mL/min
Glucose, Bld: 132 mg/dL — ABNORMAL HIGH (ref 70–99)
Potassium: 3.4 mmol/L — ABNORMAL LOW (ref 3.5–5.1)
Sodium: 136 mmol/L (ref 135–145)

## 2024-07-19 LAB — MAGNESIUM: Magnesium: 2 mg/dL (ref 1.7–2.4)

## 2024-07-19 MED ORDER — POTASSIUM CHLORIDE 10 MEQ/100ML IV SOLN
10.0000 meq | INTRAVENOUS | Status: DC
  Administered 2024-07-19: 10 meq via INTRAVENOUS
  Filled 2024-07-19: qty 100

## 2024-07-19 MED ORDER — ZOLPIDEM TARTRATE 5 MG PO TABS
2.5000 mg | ORAL_TABLET | Freq: Once | ORAL | Status: AC
  Administered 2024-07-19: 2.5 mg via ORAL
  Filled 2024-07-19: qty 1

## 2024-07-19 MED ORDER — BISACODYL 5 MG PO TBEC
10.0000 mg | DELAYED_RELEASE_TABLET | Freq: Once | ORAL | Status: AC
  Administered 2024-07-19: 10 mg via ORAL
  Filled 2024-07-19: qty 2

## 2024-07-19 MED ORDER — LORAZEPAM 2 MG/ML IJ SOLN
0.0000 mg | INTRAMUSCULAR | Status: AC
  Administered 2024-07-19 (×3): 1 mg via INTRAVENOUS
  Filled 2024-07-19 (×5): qty 1

## 2024-07-19 MED ORDER — DEXTROSE 50 % IV SOLN
25.0000 g | INTRAVENOUS | Status: AC
  Administered 2024-07-19: 25 g via INTRAVENOUS

## 2024-07-19 MED ORDER — ACETAMINOPHEN 650 MG RE SUPP: 650.0000 mg | Freq: Four times a day (QID) | RECTAL | Status: DC | PRN

## 2024-07-19 MED ORDER — ORAL CARE MOUTH RINSE: 15.0000 mL | OROMUCOSAL | Status: DC | PRN

## 2024-07-19 MED ORDER — SODIUM CHLORIDE 0.9 % IV SOLN: INTRAVENOUS | Status: DC

## 2024-07-19 MED ORDER — DEXTROSE 50 % IV SOLN
INTRAVENOUS | Status: AC
  Administered 2024-07-19: 25 g
  Filled 2024-07-19: qty 50

## 2024-07-19 MED ORDER — ONDANSETRON HCL 4 MG PO TABS
4.0000 mg | ORAL_TABLET | Freq: Four times a day (QID) | ORAL | Status: DC | PRN
  Administered 2024-07-20: 4 mg via ORAL
  Filled 2024-07-19: qty 1

## 2024-07-19 MED ORDER — POLYETHYLENE GLYCOL 3350 17 G PO PACK
17.0000 g | PACK | Freq: Every day | ORAL | Status: DC
  Administered 2024-07-19: 17 g via ORAL
  Filled 2024-07-19 (×2): qty 1

## 2024-07-19 MED ORDER — LORAZEPAM 2 MG/ML IJ SOLN
1.0000 mg | INTRAMUSCULAR | Status: DC | PRN
  Administered 2024-07-19 (×4): 1 mg via INTRAVENOUS
  Filled 2024-07-19 (×2): qty 1

## 2024-07-19 MED ORDER — LORAZEPAM 1 MG PO TABS: 1.0000 mg | ORAL_TABLET | ORAL | Status: DC | PRN

## 2024-07-19 MED ORDER — INSULIN ASPART 100 UNIT/ML IJ SOLN
0.0000 [IU] | INTRAMUSCULAR | Status: DC
  Administered 2024-07-19: 2 [IU] via SUBCUTANEOUS
  Administered 2024-07-19: 1 [IU] via SUBCUTANEOUS
  Administered 2024-07-19: 3 [IU] via SUBCUTANEOUS
  Filled 2024-07-19: qty 2
  Filled 2024-07-19: qty 3
  Filled 2024-07-19: qty 2

## 2024-07-19 MED ORDER — ONDANSETRON HCL 4 MG/2ML IJ SOLN: 4.0000 mg | Freq: Four times a day (QID) | INTRAMUSCULAR | Status: DC | PRN

## 2024-07-19 MED ORDER — LORAZEPAM 2 MG/ML IJ SOLN: 0.0000 mg | Freq: Three times a day (TID) | INTRAMUSCULAR | Status: DC

## 2024-07-19 MED ORDER — LORAZEPAM 2 MG/ML IJ SOLN
1.0000 mg | Freq: Once | INTRAMUSCULAR | Status: AC
  Administered 2024-07-19: 1 mg via INTRAVENOUS
  Filled 2024-07-19: qty 1

## 2024-07-19 MED ORDER — SODIUM CHLORIDE 0.9% FLUSH
3.0000 mL | Freq: Two times a day (BID) | INTRAVENOUS | Status: DC
  Administered 2024-07-19 – 2024-07-21 (×5): 3 mL via INTRAVENOUS

## 2024-07-19 MED ORDER — IPRATROPIUM-ALBUTEROL 0.5-2.5 (3) MG/3ML IN SOLN
3.0000 mL | Freq: Four times a day (QID) | RESPIRATORY_TRACT | Status: DC | PRN
  Administered 2024-07-19: 3 mL via RESPIRATORY_TRACT
  Filled 2024-07-19: qty 3

## 2024-07-19 MED ORDER — ACETAMINOPHEN 325 MG PO TABS
650.0000 mg | ORAL_TABLET | Freq: Four times a day (QID) | ORAL | Status: DC | PRN
  Administered 2024-07-20: 650 mg via ORAL
  Filled 2024-07-19: qty 2

## 2024-07-19 NOTE — ED Notes (Signed)
 Pt reporting new onset chest pain, central chest non radiating. Sharp. EKG done and MD opyd made aware. VSS

## 2024-07-19 NOTE — Plan of Care (Signed)

## 2024-07-19 NOTE — Consult Note (Addendum)
 "   Consultation  Referring Provider: TRH/Opyd Primary Care Physician:  Medina-Vargas, Monina C, NP Primary Gastroenterologist: Sampson  Reason for Consultation: Foreign body in  stomach  HPI: [name] is a 59 y.o. female with history of atrial fibrillation, not anticoagulated, COPD on chronic O2 at 4 to 5 L, history of alcohol  and substance abuse, seizure disorder, history of previous psychosis, and hypothyroidism.  She was seen by GI during her last hospitalization in December 2025 for iron  deficiency anemia and underwent EGD and colonoscopy.  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  At colonoscopy she had 4 small polyps removed from the transverse colon noted to have a tortuous colon and internal hemorrhoids. Patient was brought to the emergency room by EMS after witnessed seizures at home per family.  She had apparently been staying with a family member over the past couple of days due to the weather and did not bring her medications with her and therefore had not been taking her seizure medication. Elevated lactate on admission to 6.4 Febrile to 101 in ER Sats good on 5 L Respiratory panel positive for COVID-19 Today WBC 7.8/hemoglobin 11.6/hematocrit 35.1/MCV 76.6 Potassium 3.4/BUN less than 5/creatinine 0.55 Troponin 96> repeat pending Head CT negative for acute findings CT chest abdomen and pelvis pertinent for a rounded foreign body felt to be present in the hiatal hernia.  Otherwise no acute findings.  KUB this a.m. 0700 rounded foreign body present in the left upper quadrant about 5 cm below the GE junction  Blood cultures pending Neurology has consulted-she has been treated with IV Keppra /Depakote /Ativan  and has empirically been placed on vancomycin  and Zosyn   Felt to be an early EtOH withdrawal on CIWA protocol.  Patient is currently alert and communicating, oriented x 2 no recollection of any of the  events from yesterday.  No current complaints.  Denies any cough or chest pain, no abdominal pain, no dysphagia or odynophagia.  Denies intentionally swallowing any foreign objects, does not recall having anything in her mouth metallic prior to the seizures yesterday     Past Medical History:  Diagnosis Date   Abdominal pain    Accidental drug overdose 09/2011   Anxiety    Atrial fibrillation (HCC) 09/29/2011   converted spontaneously   Chronic back pain    Chronic knee pain    Chronic nausea    Chronic pain    COPD (chronic obstructive pulmonary disease) (HCC)    Depression    Diabetes mellitus    states her doctor took her off all DM meds in past month   Diabetic neuropathy (HCC)    Dyspnea    with exertion    GERD (gastroesophageal reflux disease)    Headache(784.0)    migraines    HTN (hypertension)    not on meds since in a year    Hyperlipidemia    Hypothyroidism    not on meds in a while    Mental disorder    Bipolar and schizophrenic   Nausea and vomiting 01/02/2023   Requires supplemental oxygen     as needed per patient    Schizophrenia (HCC)    Schizophrenia, acute (HCC) 11/13/2017   Tobacco abuse     Past Surgical History:  Procedure Laterality Date   ABDOMINAL HYSTERECTOMY     BLADDER SUSPENSION  03/04/2011   Procedure: [facility] PROCEDURE;  Surgeon: Glendia LABOR MacDiarmid;  Location: [facility];  Service: Urology;  Laterality: N/A;  BOWEL RESECTION N/A 04/18/2022   Procedure: SMALL BOWEL RESECTION;  Surgeon: Vanderbilt Ned, MD;  Location: [facility] OR;  Service: General;  Laterality: N/A;   COLONOSCOPY N/A 06/02/2024   Procedure: COLONOSCOPY;  Surgeon: Leigh Elspeth SQUIBB, MD;  Location: [facility] ENDOSCOPY;  Service: Gastroenterology;  Laterality: N/A;   CYSTOCELE REPAIR  03/04/2011   Procedure: ANTERIOR REPAIR (CYSTOCELE);  Surgeon: Glendia DELENA Elizabeth;  Location: [facility];  Service: Urology;  Laterality: N/A;   CYSTOSCOPY  03/04/2011   Procedure: CYSTOSCOPY;  Surgeon: Glendia DELENA  MacDiarmid;  Location: [facility];  Service: Urology;  Laterality: N/A;   ESOPHAGOGASTRODUODENOSCOPY N/A 06/02/2024   Procedure: EGD (ESOPHAGOGASTRODUODENOSCOPY);  Surgeon: Leigh Elspeth SQUIBB, MD;  Location: [facility] ENDOSCOPY;  Service: Gastroenterology;  Laterality: N/A;   ESOPHAGOGASTRODUODENOSCOPY (EGD) WITH PROPOFOL  N/A 05/12/2017   Procedure: ESOPHAGOGASTRODUODENOSCOPY (EGD) WITH PROPOFOL ;  Surgeon: Ethyl Lenis, MD;  Location: [facility];  Service: General;  Laterality: N/A;   GASTRIC ROUX-EN-Y N/A 03/25/2016   Procedure: LAPAROSCOPIC ROUX-EN-Y GASTRIC BYPASS WITH UPPER ENDOSCOPY;  Surgeon: Morene Olives, MD;  Location: [facility];  Service: General;  Laterality: N/A;   KNEE SURGERY     LAPAROSCOPIC ASSISTED VAGINAL HYSTERECTOMY  03/04/2011   Procedure: LAPAROSCOPIC ASSISTED VAGINAL HYSTERECTOMY;  Surgeon: Rosaline LITTIE Cobble, MD;  Location: [facility];  Service: Gynecology;  Laterality: N/A;   LAPAROTOMY N/A 04/18/2022   Procedure: EXPLORATORY LAPAROTOMY;  Surgeon: Vanderbilt Ned, MD;  Location: [facility];  Service: General;  Laterality: N/A;   LAPAROTOMY N/A 04/24/2022   Procedure: BRING BACK EXPLORATORY LAPAROTOMY;  Surgeon: Sebastian Moles, MD;  Location: [facility] OR;  Service: General;  Laterality: N/A;   TOTAL HIP ARTHROPLASTY Right 08/27/2022   Procedure: TOTAL HIP ARTHROPLASTY;  Surgeon: Edna Toribio DELENA, MD;  Location: [facility] OR;  Service: Orthopedics;  Laterality: Right;    Prior to Admission medications  Medication Sig Start Date End Date Taking? Authorizing Provider  acetaminophen  (TYLENOL  8 HOUR ARTHRITIS PAIN) 650 MG CR tablet Take 1,300 mg by mouth every 8 (eight) hours as needed for pain.   Yes [provider]  albuterol  (VENTOLIN  HFA) 108 (90 Base) MCG/ACT inhaler Inhale 2 puffs into the lungs every 4 (four) hours as needed for wheezing or shortness of breath. 06/10/24  Yes Raenelle Coria, MD  divalproex  (DEPAKOTE  ER) 250 MG 24 hr tablet Take 1 tablet (250 mg total) by mouth daily.  05/28/24  Yes Simon Lavonia SAILOR, MD  folic acid  (FOLVITE ) 1 MG tablet Take 1 tablet (1 mg total) by mouth daily. 06/04/24  Yes Rashid, Farhan, MD  levETIRAcetam  (KEPPRA ) 500 MG tablet Take 1 tablet (500 mg total) by mouth 2 (two) times daily. 06/16/24 07/19/24 Yes Pahwani, Fredia, MD  lidocaine  (LIDODERM ) 5 % Place 1 patch onto the skin daily as needed (pain). Remove & Discard patch within 12 hours or as directed by MD   Yes [provider]  Magnesium  Salicylate (DOANS PILLS PO) Take 1 tablet by mouth daily.   Yes [provider]  metFORMIN  (GLUCOPHAGE ) 500 MG tablet Take 500 mg by mouth daily with breakfast.   Yes [provider]  pantoprazole  (PROTONIX ) 40 MG tablet Take 1 tablet (40 mg total) by mouth daily. 06/10/24  Yes Raenelle Coria, MD  traZODone  (DESYREL ) 50 MG tablet Take 1 tablet (50 mg total) by mouth at bedtime as needed for sleep. 06/03/24  Yes Dino Antu, MD  Lancet Device MISC Use up to four times daily as directed. (FOR ICD-10 E10.9, E11.9). 06/10/24   Raenelle Coria, MD  levETIRAcetam  (KEPPRA ) 250 MG tablet Take 250 mg by mouth 2 (two) times daily. Patient not taking: Reported on 07/19/2024    [provider]  ondansetron  (ZOFRAN -ODT) 4 MG disintegrating tablet Dissolve 1 tablet (4 mg total) by mouth every 8 (eight) hours as needed for nausea or vomiting. Patient not taking: Reported on 07/19/2024 05/25/24   Tegeler, Lonni PARAS, MD  OXYGEN  Inhale 4-5 L/min into the lungs See admin instructions. Inhale 4-5 L/min into the lungs at bedtime and during the day as needed for shortness of breath    [provider]    Current Facility-Administered Medications  Medication Dose Route Frequency Provider Last Rate Last Admin   0.9 %  sodium chloride  infusion   Intravenous Continuous Opyd, Timothy S, MD   Paused at 07/19/24 0746   acetaminophen  (TYLENOL ) tablet 650 mg  650 mg Oral Q6H PRN Opyd, Evalene RAMAN, MD       Or   acetaminophen  (TYLENOL )  suppository 650 mg  650 mg Rectal Q6H PRN Opyd, Evalene RAMAN, MD       insulin  aspart (novoLOG ) injection 0-6 Units  0-6 Units Subcutaneous Q4H Opyd, Evalene RAMAN, MD       ipratropium-albuterol  (DUONEB) 0.5-2.5 (3) MG/3ML nebulizer solution 3 mL  3 mL Nebulization Q6H PRN Opyd, Timothy S, MD   3 mL at 07/19/24 0300   levETIRAcetam  (KEPPRA ) undiluted injection 500 mg  500 mg Intravenous Q12H Opyd, Timothy S, MD   500 mg at 07/19/24 0253   LORazepam  (ATIVAN ) injection 0-4 mg  0-4 mg Intravenous Q4H Opyd, Timothy S, MD   1 mg at 07/19/24 0252   Followed by   NOREEN ON 07/21/2024] LORazepam  (ATIVAN ) injection 0-4 mg  0-4 mg Intravenous Q8H Opyd, Timothy S, MD       LORazepam  (ATIVAN ) tablet 1-4 mg  1-4 mg Oral Q1H PRN Opyd, Timothy S, MD       Or   LORazepam  (ATIVAN ) injection 1-4 mg  1-4 mg Intravenous Q1H PRN Opyd, Evalene RAMAN, MD       ondansetron  (ZOFRAN ) tablet 4 mg  4 mg Oral Q6H PRN Opyd, Evalene RAMAN, MD       Or   ondansetron  (ZOFRAN ) injection 4 mg  4 mg Intravenous Q6H PRN Opyd, Timothy S, MD       sodium chloride  flush (NS) 0.9 % injection 3 mL  3 mL Intravenous Q12H Opyd, Evalene RAMAN, MD       thiamine  (VITAMIN B1) tablet 100 mg  100 mg Oral Daily Opyd, Timothy S, MD       Or   thiamine  (VITAMIN B1) injection 100 mg  100 mg Intravenous Daily Opyd, Evalene RAMAN, MD   100 mg at 07/18/24 2307    Allergies as of 07/18/2024 - Review Complete 07/18/2024  Allergen Reaction Noted   Iron  dextran Shortness Of Breath and Anxiety 11/14/2021   Aspirin  Nausea And Vomiting and Other (See Comments) 02/18/2011   Metformin  hcl er Diarrhea and Other (See Comments) 06/09/2024   Penicillins Other (See Comments) 11/21/2022    Family History  Problem Relation Age of Onset   Diabetes Mother    Heart disease Mother    Hypertension Mother    Heart attack Father        71s   Heart attack Sister        13   COPD Other    Breast cancer Neg Hx     Social History   Socioeconomic History   Marital status:  Widowed    Spouse name: Not on file   Number of children: 3   Years of education: Not on file   Highest education level: GED or equivalent  Occupational History   Not on file  Tobacco Use   Smoking status: Every Day    Current packs/day: 1.00    Types: Cigarettes   Smokeless tobacco: Never  Vaping Use   Vaping status: Never Used  Substance and Sexual Activity   Alcohol  use: Yes    Comment: 80 ounces daily   Drug use: Yes    Types: Cocaine, Marijuana, Crack cocaine    Comment: last use- 03/19/2023   Sexual activity: Not Currently    Partners: Male  Other Topics Concern   Not on file  Social History Narrative   Not on file   Social Drivers of Health   Tobacco Use: High Risk (07/18/2024)   Patient History    Smoking Tobacco Use: Every Day    Smokeless Tobacco Use: Never    Passive Exposure: Not on file  Financial Resource Strain: Low Risk (02/05/2024)   Overall Financial Resource Strain (CARDIA)    Difficulty of Paying Living Expenses: Not hard at all  Food Insecurity: No Food Insecurity (06/08/2024)   Epic    Worried About Programme Researcher, Broadcasting/film/video in the Last Year: Never true    Ran Out of Food in the Last Year: Never true  Recent Concern: Food Insecurity - Food Insecurity Present (05/28/2024)   Epic    Worried About Programme Researcher, Broadcasting/film/video in the Last Year: Sometimes true    Ran Out of Food in the Last Year: Sometimes true  Transportation Needs: No Transportation Needs (06/08/2024)   Epic    Lack of Transportation (Medical): No    Lack of Transportation (Non-Medical): No  Recent Concern: Transportation Needs - Unmet Transportation Needs (05/28/2024)   Epic    Lack of Transportation (Medical): Yes    Lack of Transportation (Non-Medical): Yes  Physical Activity: Inactive (02/05/2024)   Exercise Vital Sign    Days of Exercise per Week: 0 days    Minutes of Exercise per Session: Not on file  Stress: Stress Concern Present (02/05/2024)   [employer] of Occupational Health  - Occupational Stress Questionnaire    Feeling of Stress: Very much  Social Connections: Socially Isolated (02/05/2024)   Social Connection and Isolation Panel    Frequency of Communication with Friends and Family: More than three times a week    Frequency of Social Gatherings with Friends and Family: More than three times a week    Attends Religious Services: Never    Database Administrator or Organizations: No    Attends Banker Meetings: Not on file    Marital Status: Widowed  Intimate Partner Violence: Not At Risk (06/08/2024)   Epic    Fear of Current or Ex-Partner: No    Emotionally Abused: No    Physically Abused: No    Sexually Abused: No  Depression (PHQ2-9): High Risk (02/05/2024)   Depression (PHQ2-9)    PHQ-2 Score: 12  Alcohol  Screen: High Risk (02/14/2024)   Alcohol  Screen    Last Alcohol  Screening Score (AUDIT): 34  Housing: Low Risk (06/08/2024)   Epic    Unable to Pay for Housing in the Last Year: No    Number of Times Moved in the Last Year: 0    Homeless in the Last Year: No  Recent Concern: Housing - High Risk (05/28/2024)  Epic    Unable to Pay for Housing in the Last Year: Yes    Number of Times Moved in the Last Year: 0    Homeless in the Last Year: No  Utilities: Not At Risk (06/08/2024)   Epic    Threatened with loss of utilities: No  Health Literacy: Not on file    Review of Systems: Pertinent positive and negative review of systems were noted in the above HPI section.  All other review of systems was otherwise negative.   Physical Exam: Vital signs in last 24 hours: Temp:  [97.6 F (36.4 C)-100.4 F (38 C)] 98.3 F (36.8 C) (01/27 0846) Pulse Rate:  [101-127] 108 (01/27 0846) Resp:  [16-30] 16 (01/27 0846) BP: (106-147)/(73-98) 115/83 (01/27 0846) SpO2:  [82 %-100 %] 98 % (01/27 0846) Weight:  [58.9 kg] 58.9 kg (01/26 1509)   General:   Alert,  Well-developed, well-nourished, older African-American female pleasant and  cooperative in NAD-currently cooperative but has been trying to climb out of bed.  Aware she is in the hospital but has no idea why she is here Head:  Normocephalic and atraumatic. Eyes:  Sclera clear, no icterus.   Conjunctiva pink. Ears:  Normal auditory acuity. Nose:  No deformity, discharge,  or lesions. Mouth:  No deformity or lesions.   Neck:  Supple; no masses or thyromegaly. Lungs: Decreased breath sounds bilaterally, no wheezing  heart: Tachycardic regular rate and rhythm; no murmurs, clicks, rubs,  or gallops. Abdomen:  Soft,nontender, BS active,nonpalp mass or hsm.   Rectal: Not done Msk:  Symmetrical without gross deformities. . Pulses:  Normal pulses noted. Extremities:  Without clubbing or edema. Neurologic:  Alert and  oriented x2;   Skin:  Intact without significant lesions or rashes.. Psych:  Alert and cooperative. .  Intake/Output from previous day: No intake/output data recorded. Intake/Output this shift: Total I/O In: 92.4 [IV Piggyback:92.4] Out: -   Lab Results: Recent Labs    07/18/24 1530 07/18/24 1538 07/19/24 0257  WBC 11.2*  --  7.8  HGB 11.1* 13.6 11.6*  HCT 35.6* 40.0 35.1*  PLT 187  --  170   BMET Recent Labs    07/18/24 1530 07/18/24 1538 07/19/24 0257  NA 131* 132* 136  K 4.2 4.1 3.4*  CL 95* 99 97*  CO2 18*  --  27  GLUCOSE 152* 141* 132*  BUN 6 5* <5*  CREATININE 0.69 0.60 0.55  CALCIUM  8.4*  --  8.4*   LFT Recent Labs    07/18/24 1530  PROT 6.9  ALBUMIN  4.0  AST 32  ALT 25  ALKPHOS 99  BILITOT 0.7   PT/INR No results for input(s): LABPROT, INR in the last 72 hours. Hepatitis Panel No results for input(s): HEPBSAG, HCVAB, HEPAIGM, HEPBIGM in the last 72 hours.    IMPRESSION:  #68 59 year old female admitted through the emergency room last evening after brought in by EMS for seizures at home x 2 yesterday Patient has history of seizure disorder, history of noncompliance with medications, ongoing EtOH  and substance abuse.  Now in early EtOH withdrawal, on CIWA protocol Patient had been loaded with Keppra , Depakote  Ativan   #2 chronic respiratory failure chronically on O2 at 4 to 5 L  #3 acute COVID-19 infection  #4 foreign body initially felt to be in her hiatal hernia on CT, KUB shows this to be in the stomach this morning And has no recollection of swallowing anything metallic  #5 bipolar disorder, history of  psychosis #6  History of A-fib  #7 iron  deficiency anemia recent workup with EGD and colonoscopy largely unrevealing she is status post Roux-en-Y gastric bypass and iron  deficiency anemia likely on the basis of malabsorption  Plan; keep n.p.o. Repeat KUB stat at noon As we cannot be certain whether or not this rounded foreign body is a small coin versus a battery, we may be obligated to proceed with EGD this afternoon.  Patient is at high risk for complications with anesthesia given her chronic respiratory failure, recent uncontrolled seizures and alcohol  withdrawal. Will discuss with patient's daughter for consent as patient unable to consent for herself currently.  I discussed the situation with the patient's daughter- [name] who give verbal consent for the procedure.  Amy EsterwoodPA-C  07/19/2024, 9:30 AM      Attending Physician's Attestation   I have taken an interval history, reviewed the chart and examined the patient.   This is a patient that the GI service is asked to evaluate in the setting of foreign body ingestion with imaging concerning for initial foreign body within hiatal hernia/gastric pouch.  Patient had not been taking her seizure medications but also concern for potential alcohol  withdrawal.  She is unclear of what she may have ingested if anything.  Certainly imaging upon admission here suggestive of a lesion that is rounded in the hiatal hernia/stomach area.  Without clear sense that this was not a button battery, and not knowing the timing of  ingestion, it makes sense to consider offering her an upper endoscopy.  This was our initial plan for today.  Consent had been obtained.  Due to her underlying COVID status, her procedure was scheduled for early this afternoon, but we repeated a KUB to ensure that the circular lesion was still in a reachable area, and upon review of the most recent KUB, it is clear that this lesion has passed distal into the small intestine.  It is out of reach of typical endoscopy/enteroscopy.  At this point conservative measures will be necessary since the patient is hemodynamically doing well and not unstable.  If she develops severe worsening of pain or discomfort she will need to have repeat abdominal imaging +/- CT cross-sectional imaging to ensure no evidence of complication or bowel obstruction or perforation.  We will plan to give her laxative therapy to try to push the rounded foreign body out of the patient's system.  We will reevaluate with a KUB tomorrow.  All patient questions were answered to the best of my ability, and the patient agrees to the aforementioned plan of action with follow-up as indicated.   I agree with the Advanced Practitioner's note, impression, and recommendations with updates and my documentation as noted above.  The majority of the medical decision making/process, formulation of the impression/plan of action for the patient were performed by me with substantive portion of this encounter (>50% time spent including complete performance of at least one of the key components of MDM, History, and/or Exam).   Aloha Finner, MD [facility] Advanced [facility]oscopy Office # [phone]  "

## 2024-07-19 NOTE — Progress Notes (Signed)
 Called by the nurse to address her sinus tachycardia.  She has COVID.  She is a drinker.  She says she feels a bit dehydrated but is now drinking fluids.  We do not have an accurate medical list and the patient cannot give me any reasonable history.  Came to see the patient she was smiling did not look distressed not diaphoretic no tremors in outstretched hands, mild increased work of breathing.  Not an extremis at all.  Able to talk in full sentences.  Denies chest pressure or chest pain.  On exam her lungs are diffusely congested with rhonchi.  She has no peripheral edema and her heart rate is tachycardic without murmurs rubs or gallops.  Assessment/plan:  Sinus tachycardia -This could be secondary to rebound tachycardia from lack of beta-blocker or calcium  channel blocker.  She is unaware if she takes any of these medications at home. -Could be volume related but I am reluctant to volume expand this lady with COVID. -Could be developing a secondary pneumonia/ infiltrates although her oxygen  is stable at 97% on room air.  I will get a portable chest x-ray. -Doubt a PE she has no reticulocyte chest pain and is not expressing that she is short of breath just congested. -A benzodiazepine/alcohol  withdrawal syndrome although she is not diaphoretic and she is not particularly confused.  Is on CIWA protocol. -I advised the nurse and her charge nurse that we need to get a list of her home medications from her family.  Patient could not tell me what medicine she was on and about her chronic medical conditions.

## 2024-07-19 NOTE — Progress Notes (Signed)
 Received from ED accompanied by two staff. Was agitated and anxious and pull out her IV.  Her CIWA score is 8 and she has no IV access because she pulled out her IV. She is NPO at this time so we need to have her IV replaced so he can have her further treatment.

## 2024-07-19 NOTE — Progress Notes (Signed)
 NEUROLOGY CONSULT FOLLOW UP NOTE   Date of service: July 19, 2024 Patient Name: [name] MRN:  [id number] DOB:  1966-03-23  Interval Hx/subjective  Patient is seen in her room with no family at the bedside.  She has been hemodynamically stable overnight with Tmax of 100.4.  She is more alert and oriented today.  Vitals   Vitals:   07/19/24 0615 07/19/24 0630 07/19/24 0645 07/19/24 0700  BP: 118/83 119/83 114/79 120/82  Pulse:    (!) 103  Resp:    (!) 27  Temp:      TempSrc:      SpO2: 95%   99%  Weight:      Height:         Body mass index is 24.54 kg/m.  Physical Exam   Constitutional: Appears well-developed and well-nourished.  Psych: Affect appropriate to situation.  Eyes: No scleral injection.  HENT: No OP obstrucion.  Head: Normocephalic.  Cardiovascular: Normal rate and regular rhythm.  Respiratory: Effort normal, non-labored breathing.  Skin: WDI.   Neurologic Examination    NEURO:  Mental Status: AA&Ox3, able to give some history of present illness Speech/Language: speech is without dysarthria or aphasia.   Cranial Nerves:  II: PERRL.  III, IV, VI: EOMI. Eyelids elevate symmetrically.  V: Sensation is intact to light touch and symmetrical to face.  VII: Smile is symmetrical.  VIII: hearing intact to voice. IX, X: Phonation is normal.  KP:Dynloizm shrug 5/5. XII: tongue is midline without fasciculations. Motor: Able to move all 4 extremities with symmetrical antigravity strength Tone: is normal and bulk is normal Sensation- Intact to light touch bilaterally.  Gait- deferred   Medications Current Medications[1]  Labs and Diagnostic Imaging   CBC:  Recent Labs  Lab 07/18/24 1530 07/18/24 1538 07/19/24 0257  WBC 11.2*  --  7.8  NEUTROABS 9.8*  --   --   HGB 11.1* 13.6 11.6*  HCT 35.6* 40.0 35.1*  MCV 81.7  --  76.6*  PLT 187  --  170    Basic Metabolic Panel:  Lab Results  Component Value Date   NA 136 07/19/2024   K 3.4  (L) 07/19/2024   CO2 27 07/19/2024   GLUCOSE 132 (H) 07/19/2024   BUN <5 (L) 07/19/2024   CREATININE 0.55 07/19/2024   CALCIUM  8.4 (L) 07/19/2024   GFRNONAA >60 07/19/2024   GFRAA >60 08/03/2018   Lipid Panel:  Lab Results  Component Value Date   LDLCALC 59 02/05/2024   HgbA1c:  Lab Results  Component Value Date   HGBA1C 6.2 (H) 06/08/2024   Urine Drug Screen:     Component Value Date/Time   LABOPIA NEGATIVE 07/18/2024 2232   COCAINSCRNUR NEGATIVE 07/18/2024 2232   COCAINSCRNUR Positive (A) 10/10/2020 1319   LABBENZ NEGATIVE 07/18/2024 2232   AMPHETMU NEGATIVE 07/18/2024 2232   THCU NEGATIVE 07/18/2024 2232   LABBARB NEGATIVE 07/18/2024 2232    Alcohol  Level     Component Value Date/Time   ETH <15 07/18/2024 1531   INR  Lab Results  Component Value Date   INR 0.9 06/08/2024   APTT  Lab Results  Component Value Date   APTT 25 12/16/2022   AED levels:  Lab Results  Component Value Date   LEVETIRACETA 53.9 (H) 06/15/2024    CT Head without contrast (Personally reviewed): No acute abnormality  Continuous EEG 1/26-1/27:  - Continuous slow, generalized - Excessive beta, generalized This study is suggestive of generalized cerebral dysfunction (encephalopathy). The  excessive beta activity seen in the background is most likely due to the effect of benzodiazepine and is a benign EEG pattern. No seizures or epileptiform discharges were seen throughout the recording.  Assessment  [name] is a 59 y.o. female with history of seizure disorder with noncompliance to medications, substance abuse, alcohol  abuse, depression, GERD, hypothyroidism, hyperlipidemia, COPD on home oxygen , bipolar disorder, history of Roux-en-Y surgery and A-fib who presented with breakthrough seizures.  Patient had been staying with her daughter due to the bad weather and appeared to have forgotten her seizure medication at home.  She states she is unsure what she had done with her  medication.  She had 3 seizures witnessed at her daughter's home as well as a seizure witnessed by EMS and 1 in the emergency department.  She was given lorazepam  and loaded with Keppra .   - She was noted to be postictal on exam yesterday but appears to be alert, oriented and back to baseline today.   - Overall presentation is most consistent with breakthrough seizures secondary to medication noncompliance - Will discontinue LTM EEG monitoring as no seizure activity was detected - Will need to continue maintenance Keppra  and Depakote , as well as CIWA protocol given patient's daily alcohol  use and history of withdrawal.    Recommendations  - Discontinue LTM EEG - Continue Keppra  500 mg twice daily - Restart Depakote  250 mg daily when patient able to take p.o. medications - Continue CIWA protocol - Seizure precautions - Neurology will be available as needed ______________________________________________________________________ Patient seen by NP  Signed, Cortney E Everitt Clint Kill, NP [facility]   Electronically signed: Dr. Camellia Shark      [1]  Current Facility-Administered Medications:    0.9 %  sodium chloride  infusion, , Intravenous, Continuous, Opyd, Timothy S, MD, Last Rate: 75 mL/hr at 07/19/24 0317, New Bag at 07/19/24 0317   acetaminophen  (TYLENOL ) tablet 650 mg, 650 mg, Oral, Q6H PRN **OR** acetaminophen  (TYLENOL ) suppository 650 mg, 650 mg, Rectal, Q6H PRN, Opyd, Evalene RAMAN, MD   insulin  aspart (novoLOG ) injection 0-6 Units, 0-6 Units, Subcutaneous, Q4H, Opyd, Timothy S, MD   ipratropium-albuterol  (DUONEB) 0.5-2.5 (3) MG/3ML nebulizer solution 3 mL, 3 mL, Nebulization, Q6H PRN, Opyd, Timothy S, MD, 3 mL at 07/19/24 0300   levETIRAcetam  (KEPPRA ) undiluted injection 500 mg, 500 mg, Intravenous, Q12H, Opyd, Evalene RAMAN, MD, 500 mg at 07/19/24 9746   LORazepam  (ATIVAN ) injection 0-4 mg, 0-4 mg, Intravenous, Q4H, 1 mg at 07/19/24 0252 **FOLLOWED BY** [START ON 07/21/2024]  LORazepam  (ATIVAN ) injection 0-4 mg, 0-4 mg, Intravenous, Q8H, Opyd, Timothy S, MD   LORazepam  (ATIVAN ) tablet 1-4 mg, 1-4 mg, Oral, Q1H PRN **OR** LORazepam  (ATIVAN ) injection 1-4 mg, 1-4 mg, Intravenous, Q1H PRN, Opyd, Evalene RAMAN, MD   ondansetron  (ZOFRAN ) tablet 4 mg, 4 mg, Oral, Q6H PRN **OR** ondansetron  (ZOFRAN ) injection 4 mg, 4 mg, Intravenous, Q6H PRN, Opyd, Timothy S, MD   potassium chloride  10 mEq in 100 mL IVPB, 10 mEq, Intravenous, Q1 Hr x 2, Opyd, Evalene RAMAN, MD, Last Rate: 100 mL/hr at 07/19/24 0633, 10 mEq at 07/19/24 9366   sodium chloride  flush (NS) 0.9 % injection 3 mL, 3 mL, Intravenous, Q12H, Opyd, Evalene RAMAN, MD   thiamine  (VITAMIN B1) tablet 100 mg, 100 mg, Oral, Daily **OR** thiamine  (VITAMIN B1) injection 100 mg, 100 mg, Intravenous, Daily, Opyd, Timothy S, MD, 100 mg at 07/18/24 2307  Current Outpatient Medications:    albuterol  (VENTOLIN  HFA) 108 (90 Base) MCG/ACT inhaler,  Inhale 2 puffs into the lungs every 4 (four) hours as needed for wheezing or shortness of breath., Disp: 18 g, Rfl: 1   divalproex  (DEPAKOTE  ER) 250 MG 24 hr tablet, Take 1 tablet (250 mg total) by mouth daily., Disp: 90 tablet, Rfl: 1   folic acid  (FOLVITE ) 1 MG tablet, Take 1 tablet (1 mg total) by mouth daily., Disp: 30 tablet, Rfl: 0   Lancet Device MISC, Use up to four times daily as directed. (FOR ICD-10 E10.9, E11.9). (Patient not taking: Reported on 07/19/2024), Disp: 1 each, Rfl: 0   levETIRAcetam  (KEPPRA ) 500 MG tablet, Take 1 tablet (500 mg total) by mouth 2 (two) times daily., Disp: 60 tablet, Rfl: 0   metFORMIN  (GLUCOPHAGE ) 500 MG tablet, Take 500 mg by mouth daily with breakfast., Disp: , Rfl:    ondansetron  (ZOFRAN -ODT) 4 MG disintegrating tablet, Dissolve 1 tablet (4 mg total) by mouth every 8 (eight) hours as needed for nausea or vomiting. (Patient not taking: Reported on 07/19/2024), Disp: 20 tablet, Rfl: 0   OXYGEN , Inhale 4-5 L/min into the lungs See admin instructions. Inhale 4-5 L/min into  the lungs at bedtime and during the day as needed for shortness of breath, Disp: , Rfl:    pantoprazole  (PROTONIX ) 40 MG tablet, Take 1 tablet (40 mg total) by mouth daily., Disp: 30 tablet, Rfl: 2   traZODone  (DESYREL ) 50 MG tablet, Take 1 tablet (50 mg total) by mouth at bedtime as needed for sleep., Disp: , Rfl:

## 2024-07-19 NOTE — ED Notes (Signed)
 Pt pulled EJ out of her neck. Pt stating she was just scratching. Infusions paused at this time. IV team consult order placed.

## 2024-07-19 NOTE — Progress Notes (Signed)
 Hypoglycemic Event  CBG: 48  Treatment: D50 50 mL (25 gm)  Symptoms: None  Follow-up CBG: Time:1328 CBG Result:185  Possible Reasons for Event: Inadequate meal intake  Comments/MD notified:asymptomatic family at bedside.    [name]  [employer]

## 2024-07-19 NOTE — Progress Notes (Signed)
 Has been agitated and anxious. Trying to climb out of bed and leave. Said she needs to go smoke or have a drink and is leaving and refuses to stay in bed or in chair, This nurse even walked her in the room and allowed her to walk around the room but is constantly asking for her coat so she can leave ASAP.  Did also take out her IV prior to coming to this unit this am.  Is redirectable when someone is present or reminding her to not get up or to stay in bed, then she is back to wanting to leave or trying to leave.  All fall risk and seizure protocol interventions in place.  Did ask MD about discontinuing her SCDs for now, as she keeps getting up and setting off bed alarm and trying to walk but does not seem to be aware that she is wearing them.

## 2024-07-19 NOTE — Progress Notes (Signed)
 Patient is complaining of sharp pain in the central chest.   No acute ischemic features noted on EKG. Could be related to the foreign body seen in the esophagus on imaging last night. Plan to check CXR now. Will also check troponins.

## 2024-07-19 NOTE — Progress Notes (Signed)
 LTM EEG D/C'd. No noted skin break down. [facility] notified.

## 2024-07-19 NOTE — Plan of Care (Signed)
" °  Problem: Education: Goal: Knowledge of risk factors and measures for prevention of condition will improve Outcome: Progressing   Problem: Coping: Goal: Psychosocial and spiritual needs will be supported Outcome: Progressing   Problem: Respiratory: Goal: Will maintain a patent airway Outcome: Progressing Goal: Complications related to the disease process, condition or treatment will be avoided or minimized Outcome: Progressing   Problem: Education: Goal: Ability to describe self-care measures that may prevent or decrease complications (Diabetes Survival Skills Education) will improve Outcome: Progressing Goal: Individualized Educational Video(s) Outcome: Progressing   Problem: Coping: Goal: Ability to adjust to condition or change in health will improve Outcome: Progressing   Problem: Fluid Volume: Goal: Ability to maintain a balanced intake and output will improve Outcome: Progressing   Problem: Health Behavior/Discharge Planning: Goal: Ability to identify and utilize available resources and services will improve Outcome: Progressing Goal: Ability to manage health-related needs will improve Outcome: Progressing   Problem: Metabolic: Goal: Ability to maintain appropriate glucose levels will improve Outcome: Progressing   Problem: Nutritional: Goal: Maintenance of adequate nutrition will improve Outcome: Progressing Goal: Progress toward achieving an optimal weight will improve Outcome: Progressing   Problem: Skin Integrity: Goal: Risk for impaired skin integrity will decrease Outcome: Progressing   Problem: Tissue Perfusion: Goal: Adequacy of tissue perfusion will improve Outcome: Progressing   Problem: Education: Goal: Knowledge of General Education information will improve Description: Including pain rating scale, medication(s)/side effects and non-pharmacologic comfort measures Outcome: Progressing   Problem: Health Behavior/Discharge Planning: Goal:  Ability to manage health-related needs will improve Outcome: Progressing   Problem: Clinical Measurements: Goal: Ability to maintain clinical measurements within normal limits will improve Outcome: Progressing Goal: Will remain free from infection Outcome: Progressing Goal: Diagnostic test results will improve Outcome: Progressing Goal: Respiratory complications will improve Outcome: Progressing Goal: Cardiovascular complication will be avoided Outcome: Progressing   Problem: Activity: Goal: Risk for activity intolerance will decrease Outcome: Progressing   Problem: Nutrition: Goal: Adequate nutrition will be maintained Outcome: Progressing   Problem: Coping: Goal: Level of anxiety will decrease Outcome: Progressing   Problem: Elimination: Goal: Will not experience complications related to bowel motility Outcome: Progressing Goal: Will not experience complications related to urinary retention Outcome: Progressing   Problem: Pain Managment: Goal: General experience of comfort will improve and/or be controlled Outcome: Progressing   Problem: Safety: Goal: Ability to remain free from injury will improve Outcome: Progressing   Problem: Skin Integrity: Goal: Risk for impaired skin integrity will decrease Outcome: Progressing   Problem: Safety: Goal: Non-violent Restraint(s) Outcome: Progressing   "

## 2024-07-19 NOTE — Progress Notes (Signed)
" °   07/19/24 1648  Provider Notification  Provider Name/Title Dr. Maranda  Date Provider Notified 07/19/24  Time Provider Notified 1728  Method of Notification Page  Notification Reason Other (Comment) (when the patient get agitated HR goes up to 159b/m and her respiratory rate is 36b/m on counting manually. No signs of respiratory distress. SPO2 is between 94-98% on Room Air. MEWS is red because of HR and RR.)  Provider response At bedside (assessed patient and said it is due to alcohol  withdrawl syndrome and monitor patient frequently and give IV Ativan  according to CIWA score.)  Date of Provider Response 07/19/24  Time of Provider Response 1819    "

## 2024-07-19 NOTE — Anesthesia Preprocedure Evaluation (Signed)
"                                    Anesthesia Evaluation  Patient identified by MRN, date of birth, ID band  Reviewed: Allergy & Precautions, NPO status , Patient's Chart, lab work & pertinent test results  History of Anesthesia Complications Negative for: history of anesthetic complications  Airway        Dental   Pulmonary COPD (Wears 4-5L [place]),  oxygen  dependent, Current Smoker          Cardiovascular hypertension, Pt. on medications and Pt. on home beta blockers + dysrhythmias Atrial Fibrillation      Neuro/Psych  Headaches, Seizures -, Well Controlled,   Anxiety Depression  Schizophrenia     GI/Hepatic ,GERD  Controlled and Medicated,, Foreign Body Ingestion (?possible coin)  S/p Roux-En-Y   Endo/Other  diabetes, Type 2Hypothyroidism    Renal/GU      Musculoskeletal   Abdominal   Peds  Hematology  (+) Blood dyscrasia, anemia   Anesthesia Other Findings   Reproductive/Obstetrics                              Anesthesia Physical Anesthesia Plan Anesthesia Quick Evaluation  "

## 2024-07-19 NOTE — H&P (Signed)
 " History and Physical    [name] FMW:[id number] DOB: 1965/12/10 DOA: 07/18/2024  PCP: [name], NP   Patient coming from: Home   Chief Complaint: Seizures   HPI: [name] is a 59 y.o. female with medical history significant for COPD, chronic hypoxic respiratory failure, alcohol  and drug abuse, hypothyroidism, seizures, and history of psychosis who presents for evaluation of seizures.  Patient has reportedly been staying with a family member for the past 2 days but did not bring her medications with her and was noted to have multiple seizures today.  Patient had an apparent seizure at home this morning, 2 more prior to EMS arrival, and a fourth when EMS arrived.  ED Course: Upon arrival to the ED, patient is found to be febrile to 38.0 C and saturating well on 5 L/min of supplemental oxygen  with elevated heart rate and stable blood pressure.  Labs are most notable for normal creatinine, lactic acid 6.4, undetectable valproic  acid level, and positive COVID-19 PCR.  Head CT was negative for acute findings.  CT of the chest, abdomen, and pelvis is concerning for coin or other object in the distal esophagus.  Neurology was consulted and evaluated the patient in the ED.  GI (Dr. Albertus) was also consulted by the ED physician and recommended keeping the patient n.p.o. and repeating chest x-ray in the morning.  Blood cultures were collected and the patient was treated with acetaminophen , IV Keppra , 2 L LR, IV Ativan , thiamine , Depakote , vancomycin , and Zosyn .  Review of Systems:  ROS limited by patient's clinical condition.  Past Medical History:  Diagnosis Date   Abdominal pain    Accidental drug overdose 09/2011   Anxiety    Atrial fibrillation (HCC) 09/29/2011   converted spontaneously   Chronic back pain    Chronic knee pain    Chronic nausea    Chronic pain    COPD (chronic obstructive pulmonary disease) (HCC)    Depression    Diabetes mellitus    states  her doctor took her off all DM meds in past month   Diabetic neuropathy (HCC)    Dyspnea    with exertion    GERD (gastroesophageal reflux disease)    Headache(784.0)    migraines    HTN (hypertension)    not on meds since in a year    Hyperlipidemia    Hypothyroidism    not on meds in a while    Mental disorder    Bipolar and schizophrenic   Nausea and vomiting 01/02/2023   Requires supplemental oxygen     as needed per patient    Schizophrenia (HCC)    Schizophrenia, acute (HCC) 11/13/2017   Tobacco abuse     Past Surgical History:  Procedure Laterality Date   ABDOMINAL HYSTERECTOMY     BLADDER SUSPENSION  03/04/2011   Procedure: [facility] PROCEDURE;  Surgeon: Glendia LABOR MacDiarmid;  Location: [facility] ORS;  Service: Urology;  Laterality: N/A;   BOWEL RESECTION N/A 04/18/2022   Procedure: SMALL BOWEL RESECTION;  Surgeon: Vanderbilt Ned, MD;  Location: [facility] OR;  Service: General;  Laterality: N/A;   COLONOSCOPY N/A 06/02/2024   Procedure: COLONOSCOPY;  Surgeon: Leigh Elspeth SQUIBB, MD;  Location: [facility] ENDOSCOPY;  Service: Gastroenterology;  Laterality: N/A;   CYSTOCELE REPAIR  03/04/2011   Procedure: ANTERIOR REPAIR (CYSTOCELE);  Surgeon: Glendia LABOR Elizabeth;  Location: [facility];  Service: Urology;  Laterality: N/A;   CYSTOSCOPY  03/04/2011   Procedure: CYSTOSCOPY;  Surgeon: Glendia LABOR  MacDiarmid;  Location: [facility];  Service: Urology;  Laterality: N/A;   ESOPHAGOGASTRODUODENOSCOPY N/A 06/02/2024   Procedure: EGD (ESOPHAGOGASTRODUODENOSCOPY);  Surgeon: Leigh Elspeth SQUIBB, MD;  Location: [facility] ENDOSCOPY;  Service: Gastroenterology;  Laterality: N/A;   ESOPHAGOGASTRODUODENOSCOPY (EGD) WITH PROPOFOL  N/A 05/12/2017   Procedure: ESOPHAGOGASTRODUODENOSCOPY (EGD) WITH PROPOFOL ;  Surgeon: Ethyl Lenis, MD;  Location: [facility] ENDOSCOPY;  Service: General;  Laterality: N/A;   GASTRIC ROUX-EN-Y N/A 03/25/2016   Procedure: LAPAROSCOPIC ROUX-EN-Y GASTRIC BYPASS WITH UPPER ENDOSCOPY;  Surgeon: Morene Olives, MD;   Location: [facility] ORS;  Service: General;  Laterality: N/A;   KNEE SURGERY     LAPAROSCOPIC ASSISTED VAGINAL HYSTERECTOMY  03/04/2011   Procedure: LAPAROSCOPIC ASSISTED VAGINAL HYSTERECTOMY;  Surgeon: Rosaline LITTIE Cobble, MD;  Location: [facility];  Service: Gynecology;  Laterality: N/A;   LAPAROTOMY N/A 04/18/2022   Procedure: EXPLORATORY LAPAROTOMY;  Surgeon: Vanderbilt Ned, MD;  Location: [facility] OR;  Service: General;  Laterality: N/A;   LAPAROTOMY N/A 04/24/2022   Procedure: BRING BACK EXPLORATORY LAPAROTOMY;  Surgeon: Sebastian Moles, MD;  Location: [facility] OR;  Service: General;  Laterality: N/A;   TOTAL HIP ARTHROPLASTY Right 08/27/2022   Procedure: TOTAL HIP ARTHROPLASTY;  Surgeon: Edna Toribio LABOR, MD;  Location: [facility] OR;  Service: Orthopedics;  Laterality: Right;    Social History:   reports that she has been smoking cigarettes. She has never used smokeless tobacco. She reports current alcohol  use. She reports current drug use. Drugs: Cocaine, Marijuana, and Crack cocaine.  Allergies[1]  Family History  Problem Relation Age of Onset   Diabetes Mother    Heart disease Mother    Hypertension Mother    Heart attack Father        66s   Heart attack Sister        74   COPD Other    Breast cancer Neg Hx      Prior to Admission medications  Medication Sig Start Date End Date Taking? Authorizing Provider  albuterol  (VENTOLIN  HFA) 108 (90 Base) MCG/ACT inhaler Inhale 2 puffs into the lungs every 4 (four) hours as needed for wheezing or shortness of breath. 06/10/24   Raenelle Coria, MD  divalproex  (DEPAKOTE  ER) 250 MG 24 hr tablet Take 1 tablet (250 mg total) by mouth daily. 05/28/24   Simon Lavonia SAILOR, MD  folic acid  (FOLVITE ) 1 MG tablet Take 1 tablet (1 mg total) by mouth daily. 06/04/24   Dino Antu, MD  Lancet Device MISC Use up to four times daily as directed. (FOR ICD-10 E10.9, E11.9). Patient not taking: Reported on 07/19/2024 06/10/24   Raenelle Coria, MD  levETIRAcetam  (KEPPRA ) 500 MG  tablet Take 1 tablet (500 mg total) by mouth 2 (two) times daily. 06/16/24 07/16/24  Vernon Ranks, MD  metFORMIN  (GLUCOPHAGE ) 500 MG tablet Take 500 mg by mouth daily with breakfast.    [provider]  ondansetron  (ZOFRAN -ODT) 4 MG disintegrating tablet Dissolve 1 tablet (4 mg total) by mouth every 8 (eight) hours as needed for nausea or vomiting. Patient not taking: Reported on 07/19/2024 05/25/24   Tegeler, Lonni PARAS, MD  OXYGEN  Inhale 4-5 L/min into the lungs See admin instructions. Inhale 4-5 L/min into the lungs at bedtime and during the day as needed for shortness of breath    [provider]  pantoprazole  (PROTONIX ) 40 MG tablet Take 1 tablet (40 mg total) by mouth daily. 06/10/24   Raenelle Coria, MD  traZODone  (DESYREL ) 50 MG tablet Take 1 tablet (50 mg total) by mouth at bedtime as  needed for sleep. 06/03/24   Dino Antu, MD    Physical Exam: Vitals:   07/19/24 0000 07/19/24 0025 07/19/24 0030 07/19/24 0045  BP: 121/82 123/81 121/75 (!) 115/96  Pulse: (!) 110 (!) 110  (!) 115  Resp:      Temp:      TempSrc:      SpO2: 96%   100%  Weight:      Height:        Constitutional: NAD, no pallor or diaphoresis   Eyes: PERTLA, lids and conjunctivae normal ENMT: Mucous membranes are moist. Posterior pharynx clear of any exudate or lesions.   Neck: supple, no masses  Respiratory: no wheezing, no crackles. No accessory muscle use.  Cardiovascular: S1 & S2 heard, regular rate and rhythm. No extremity edema.  Abdomen: No tenderness, soft. Bowel sounds active.  Musculoskeletal: no clubbing / cyanosis. No joint deformity upper and lower extremities.   Skin: no significant rashes, lesions, ulcers. Warm, dry, well-perfused. Neurologic: Sleeping. Wakes to loud voice and makes brief eye-contact before returning to sleep. Moving all extremities spontaneously.    Labs and Imaging on Admission: I have personally reviewed following labs and imaging studies  CBC: Recent  Labs  Lab 07/18/24 1530 07/18/24 1538  WBC 11.2*  --   NEUTROABS 9.8*  --   HGB 11.1* 13.6  HCT 35.6* 40.0  MCV 81.7  --   PLT 187  --    Basic Metabolic Panel: Recent Labs  Lab 07/18/24 1530 07/18/24 1538  NA 131* 132*  K 4.2 4.1  CL 95* 99  CO2 18*  --   GLUCOSE 152* 141*  BUN 6 5*  CREATININE 0.69 0.60  CALCIUM  8.4*  --    GFR: Estimated Creatinine Clearance: 61.6 mL/min (by C-G formula based on SCr of 0.6 mg/dL). Liver Function Tests: Recent Labs  Lab 07/18/24 1530  AST 32  ALT 25  ALKPHOS 99  BILITOT 0.7  PROT 6.9  ALBUMIN  4.0   No results for input(s): LIPASE, AMYLASE in the last 168 hours. No results for input(s): AMMONIA in the last 168 hours. Coagulation Profile: No results for input(s): INR, PROTIME in the last 168 hours. Cardiac Enzymes: No results for input(s): CKTOTAL, CKMB, CKMBINDEX, TROPONINI in the last 168 hours. BNP (last 3 results) Recent Labs    06/15/24 1340  PROBNP 137.0   HbA1C: No results for input(s): HGBA1C in the last 72 hours. CBG: No results for input(s): GLUCAP in the last 168 hours. Lipid Profile: No results for input(s): CHOL, HDL, LDLCALC, TRIG, CHOLHDL, LDLDIRECT in the last 72 hours. Thyroid  Function Tests: No results for input(s): TSH, T4TOTAL, FREET4, T3FREE, THYROIDAB in the last 72 hours. Anemia Panel: No results for input(s): VITAMINB12, FOLATE, FERRITIN, TIBC, IRON , RETICCTPCT in the last 72 hours. Urine analysis:    Component Value Date/Time   COLORURINE STRAW (A) 07/18/2024 2232   APPEARANCEUR CLEAR 07/18/2024 2232   LABSPEC 1.017 07/18/2024 2232   PHURINE 6.0 07/18/2024 2232   GLUCOSEU NEGATIVE 07/18/2024 2232   HGBUR NEGATIVE 07/18/2024 2232   BILIRUBINUR NEGATIVE 07/18/2024 2232   KETONESUR NEGATIVE 07/18/2024 2232   PROTEINUR NEGATIVE 07/18/2024 2232   UROBILINOGEN 0.2 09/04/2013 0426   NITRITE NEGATIVE 07/18/2024 2232   LEUKOCYTESUR  NEGATIVE 07/18/2024 2232   Sepsis Labs: @LABRCNTIP (procalcitonin:4,lacticidven:4) ) Recent Results (from the past 240 hours)  Resp panel by RT-PCR (RSV, Flu A&B, Covid) Anterior Nasal Swab     Status: Abnormal   Collection Time: 07/18/24  6:49 PM  Specimen: Anterior Nasal Swab  Result Value Ref Range Status   SARS Coronavirus 2 by RT PCR POSITIVE (A) NEGATIVE Final   Influenza A by PCR NEGATIVE NEGATIVE Final   Influenza B by PCR NEGATIVE NEGATIVE Final    Comment: (NOTE) The Xpert Xpress SARS-CoV-2/FLU/RSV plus assay is intended as an aid in the diagnosis of influenza from Nasopharyngeal swab specimens and should not be used as a sole basis for treatment. Nasal washings and aspirates are unacceptable for Xpert Xpress SARS-CoV-2/FLU/RSV testing.  Fact Sheet for Patients: bloggercourse.com  Fact Sheet for Healthcare Providers: seriousbroker.it  This test is not yet approved or cleared by the [place]  FDA and has been authorized for detection and/or diagnosis of SARS-CoV-2 by FDA under an Emergency Use Authorization (EUA). This EUA will remain in effect (meaning this test can be used) for the duration of the COVID-19 declaration under Section 564(b)(1) of the Act, 21 U.S.C. section 360bbb-3(b)(1), unless the authorization is terminated or revoked.     Resp Syncytial Virus by PCR NEGATIVE NEGATIVE Final    Comment: (NOTE) Fact Sheet for Patients: bloggercourse.com  Fact Sheet for Healthcare Providers: seriousbroker.it  This test is not yet approved or cleared by the [place]  FDA and has been authorized for detection and/or diagnosis of SARS-CoV-2 by FDA under an Emergency Use Authorization (EUA). This EUA will remain in effect (meaning this test can be used) for the duration of the COVID-19 declaration under Section 564(b)(1) of the Act, 21 U.S.C. section  360bbb-3(b)(1), unless the authorization is terminated or revoked.  Performed at [facility], [address] [place], [place] [address]      Radiological Exams on Admission: CT CHEST ABDOMEN PELVIS W CONTRAST Result Date: 07/18/2024 EXAM: CT CHEST, ABDOMEN AND PELVIS WITH CONTRAST 07/18/2024 09:33:52 PM TECHNIQUE: CT of the chest, abdomen and pelvis was performed with the administration of 75 mL of iohexol  (OMNIPAQUE ) 350 MG/ML injection. Multiplanar reformatted images are provided for review. Automated exposure control, iterative reconstruction, and/or weight based adjustment of the mA/kV was utilized to reduce the radiation dose to as low as reasonably achievable. COMPARISON: Comparison with chest radiograph 07/18/2024, CT chest 06/15/2024, and CT abdomen and pelvis 06/08/2024. CLINICAL HISTORY: Sepsis. FINDINGS: CHEST: MEDIASTINUM AND LYMPH NODES: Heart size is normal. No pericardial effusions. Esophagus is decompressed. Normal caliber thoracic aorta. No dissection. Calcification of the aorta and coronary arteries. No significant mediastinal, hilar or axillary lymphadenopathy. The thyroid  gland is unremarkable. LUNGS AND PLEURA: Mild atelectasis in the lung bases. Motion artifact in the lung bases. No focal consolidation or pulmonary edema. No pleural effusion. No pneumothorax. ABDOMEN AND PELVIS: LIVER: Normal. GALLBLADDER AND BILE DUCTS: Normal. No biliary ductal dilatation. SPLEEN: Normal. PANCREAS: Normal. ADRENAL GLANDS: Normal. KIDNEYS, URETERS AND BLADDER: Kidneys, ureters, and bladder are normal. No stones in the kidneys or ureters. No hydronephrosis. No perinephric or periureteral stranding. GI AND BOWEL: Postoperative changes consistent with gastric bypass. Small esophageal hiatal hernia. Within the hernia, there is a rounded metallic structure possibly representing an ingested foreign body such as a coin. The stomach, small bowel, and colon are mostly decompressed. No wall thickening  or inflammatory stranding are appreciated. The appendix is not demonstrated. Broad-based midline anterior abdominal wall hernia contains a portion of the transverse colon without proximal obstruction. There is no bowel obstruction. REPRODUCTIVE ORGANS: The uterus is surgically absent. No abnormal adnexal masses. PERITONEUM AND RETROPERITONEUM: No ascites. No free air. No retroperitoneal lymphadenopathy. VASCULATURE: Aorta is normal in caliber. Calcification of the  aorta. No aneurysm. ABDOMINAL AND PELVIS LYMPH NODES: No significant pelvic lymphadenopathy. No retroperitoneal lymphadenopathy. BONES AND SOFT TISSUES: Postoperative right hip arthroplasty. Compression deformities at L1, T9, T6, and T4 vertebrae. No change since prior study. No acute compression is identified. Multiple compression fractures may indicate osteoporosis. No focal soft tissue abnormality. IMPRESSION: 1. No acute findings. 2. Ingested foreign body such as a coin in the distal esophagus. 3. Broad-based midline anterior abdominal wall hernia containing a portion of the transverse colon without proximal obstruction. Electronically signed by: Elsie Gravely MD 07/18/2024 09:58 PM EST RP Workstation: [id number]   CT Head Wo Contrast Result Date: 07/18/2024 CLINICAL DATA:  Seizure disorder EXAM: CT HEAD WITHOUT CONTRAST TECHNIQUE: Contiguous axial images were obtained from the base of the skull through the vertex without intravenous contrast. RADIATION DOSE REDUCTION: This exam was performed according to the departmental dose-optimization program which includes automated exposure control, adjustment of the mA and/or kV according to patient size and/or use of iterative reconstruction technique. COMPARISON:  CT brain 06/15/2024 FINDINGS: Brain: No acute territorial infarction, hemorrhage or intracranial mass. The ventricles are nonenlarged. Vascular: No hyperdense vessels.  Carotid vascular calcification Skull: Normal. Negative for fracture or  focal lesion. Sinuses/Orbits: No acute finding. Other: None IMPRESSION: No CT evidence for acute intracranial abnormality Electronically Signed   By: Luke Bun M.D.   On: 07/18/2024 21:40   DG Chest Portable 1 View Result Date: 07/18/2024 CLINICAL DATA:  Seizures, concern for aspiration. EXAM: PORTABLE CHEST 1 VIEW COMPARISON:  CT chest 06/15/2024 and chest radiograph 06/15/2024. FINDINGS: Patient's head obscures the superior mediastinum. Heart is enlarged, stable. Lungs are low in volume with minimal bibasilar streaky atelectasis. A rounded metallic density projects in the region of the gastroesophageal junction or proximal stomach. IMPRESSION: 1. Low lung volumes with bibasilar subsegmental atelectasis. 2. Rounded metallic structure projecting over the gastroesophageal junction or proximal stomach may be external to the patient. Ingested foreign body cannot be excluded. Please correlate clinically. Electronically Signed   By: Newell Eke M.D.   On: 07/18/2024 16:11    EKG: Independently reviewed. Sinus tachycardia, rate 123.   Assessment/Plan   1. Breakthrough seizures  - Appreciate neurology consultation  - No acute findings on head CT  - Continue Keppra  at home dose, will use IV formulation for now given her somnolence, monitor for and treat alcohol  withdrawal    2. Foreign body in distal esophagus  - Coin or other objected noted in distal esophagus on CT in ED  - GI recommends keeping patient NPO and repeating CXR in am   3. Alcohol  dependence and drug abuse  - Monitor with CIWA, use Ativan  as-needed, supplement vitamins, consult TOC    4. COPD; chronic hypoxic respiratory failure  - Not in exacerbation  - Continue supplemental O2, use DuoNebs as-needed    5. Type II DM  - A1c was 6.2% in December 2025  - Check CBGs and use low-intensity SSI for now    DVT prophylaxis: Lovenox   Code Status: Full  Level of Care: Level of care: Progressive Family Communication: Daughter  updated from ED   Disposition Plan:  Patient is from: Home  Anticipated d/c is to: Home  Anticipated d/c date is: Possibly as early as 07/19/24 Patient currently: Pending GI consultation and clinical improvement Consults called: GI, Neurology Admission status: Observation     Evalene GORMAN Sprinkles, MD [facility]  07/19/2024, 1:52 AM       [1]  Allergies Allergen Reactions   Iron   Dextran Shortness Of Breath and Anxiety   Aspirin  Nausea And Vomiting and Other (See Comments)    Ok to take tylenol  or ibuprofen     Metformin  Hcl Er Diarrhea and Other (See Comments)    Gi issues with the 750 mg ER version   Penicillins Other (See Comments)    Unknown reaction from childhood: family would like this to remain as an allergy.  Tolerated cephalosporins and zosyn    "

## 2024-07-19 NOTE — ED Notes (Signed)
 Pt desatted to 82% RA while asleep, 2L [place] applied and pt increased to 100%. MD opyd made aware

## 2024-07-19 NOTE — ED Notes (Signed)
 1st lac 1.0 in normal range can be canceled If Dr mirian

## 2024-07-19 NOTE — Procedures (Addendum)
 Patient Name: [name]  MRN: [id number]  Epilepsy Attending: Arlin MALVA Krebs  Referring Physician/Provider: everitt Clint Abbey Earle FORBES, NP  Duration: 07/18/2024 1927 to 07/19/2024 1443  Patient history: 59 y.White. female with hx of seizure disorder with noncompliance to medications who presents with 3 breakthrough seizures followed by a seizure witnessed by EMS and another seizure in the emergency department. EEG to evaluate for seizure  Level of alertness: Awake, asleep  AEDs during EEG study: LEV, Ativan   Technical aspects: This EEG study was done with scalp electrodes positioned according to the 10-20 International system of electrode placement. Electrical activity was reviewed with band pass filter of 1-70Hz , sensitivity of 7 uV/mm, display speed of 84mm/sec with a 60Hz  notched filter applied as appropriate. EEG data were recorded continuously and digitally stored.  Video monitoring was available and reviewed as appropriate.  Description: The posterior dominant rhythm consists of 8-9 Hz activity of moderate voltage (25-35 uV) seen predominantly in posterior head regions, symmetric and reactive to eye opening and eye closing. Sleep was characterized by vertex waves, sleep spindles (12 to 14 Hz), maximal frontocentral region. EEG showed continuous generalized 3 to 6 Hz theta-delta slowing admixed with an excessive amount of 15 to 18 Hz beta activity distributed symmetrically and diffusely. Hyperventilation and photic stimulation were not performed.     EEG was disconnected between 07/19/2024 0817 to 1234 for testing  ABNORMALITY - Continuous slow, generalized - Excessive beta, generalized  IMPRESSION: This study is suggestive of generalized cerebral dysfunction (encephalopathy). The excessive beta activity seen in the background is most likely due to the effect of benzodiazepine and is a benign EEG pattern. No seizures or epileptiform discharges were seen throughout the recording.  [name]  [name]

## 2024-07-19 NOTE — ED Notes (Signed)
 Phlebotomy in room assisting with lab draw

## 2024-07-19 NOTE — Progress Notes (Signed)
vLTM maintenance  All impedances below 10kohms.  No skin breakdown noted at FP1 FP2  FZ  CZ

## 2024-07-19 NOTE — Progress Notes (Signed)
 " PROGRESS NOTE    [name]  FMW:[id number] DOB: 09-Jan-1966 DOA: 07/18/2024 PCP: [name], NP   Chief Complaint: Seizures    HPI: [name] is a 59 y.o. female with medical history significant for COPD, chronic hypoxic respiratory failure, alcohol  and drug abuse, hypothyroidism, seizures, and history of psychosis who presents for evaluation of seizures.   Patient has reportedly been staying with a family member for the past 2 days but did not bring her medications with her and was noted to have multiple seizures today.  Patient had an apparent seizure at home this morning, 2 more prior to EMS arrival, and a fourth when EMS arrived.   ED Course: Upon arrival to the ED, patient is found to be febrile to 38.0 C and saturating well on 5 L/min of supplemental oxygen  with elevated heart rate and stable blood pressure.  Labs are most notable for normal creatinine, lactic acid 6.4, undetectable valproic  acid level, and positive COVID-19 PCR.  Head CT was negative for acute findings.  CT of the chest, abdomen, and pelvis is concerning for coin or other object in the distal esophagus.   Neurology was consulted and evaluated the patient in the ED.  GI (Dr. Albertus) was also consulted by the ED physician and recommended keeping the patient n.p.o. and repeating chest x-ray in the morning.   Blood cultures were collected and the patient was treated with acetaminophen , IV Keppra , 2 L LR, IV Ativan , thiamine , Depakote , vancomycin , and Zosyn .  Subjective:  Patient seen on the medical floor.  She is calm.  She has no recollection of the events yesterday.  She denies any complaints whatsoever.  She tells me she drinks about a 40 ounce every day.  She is contemplating to quit.  Hospital Course:      Assessment and Plan:   1. Breakthrough seizures  - Appreciate neurology consultation  - No acute findings on head CT  -Peers to have resolved.  Continue IV Keppra  overnight and  consider discharge in the morning. - Continue Keppra  at home dose, will use IV formulation for now given her somnolence, monitor for and treat alcohol  withdrawal     2. Foreign body in distal esophagus  - Coin or other objected noted in distal esophagus on CT in ED  - GI recommends keeping patient NPO  -repeat CXR shows FB is moving.   3. Alcohol  dependence and drug abuse  - Monitor with CIWA, use Ativan  as-needed, supplement vitamins, consult TOC  - Currently no tremors diaphoresis or confusion.  No asterixis   4. COPD; chronic hypoxic respiratory failure  - Not in exacerbation  - Continue supplemental O2, use DuoNebs as-needed     5. Type II DM  - A1c was 6.2% in December 2025  - Check CBGs and use low-intensity SSI for now      DVT prophylaxis: Lovenox   Code Status: Full  Level of Care: Level of care: Progressive Family Communication: Daughter updated from ED   Disposition Plan:  Patient is from: Home  Anticipated d/c is to: Home  Anticipated d/c date is: Possibly as early as 07/19/24 Patient currently: Pending GI consultation and clinical improvement Consults called: GI, Neurology Admission status: Observation    DVT prophylaxis: SCDs Start: 07/19/24 0151  Reason for continuing need for hospitalization: Monitoring for further seizures and EtOH withdrawal  Objective: Vitals:   07/19/24 0645 07/19/24 0700 07/19/24 0730 07/19/24 0846  BP: 114/79 120/82 110/79 115/83  Pulse:  ROLLEN)  103 (!) 105 (!) 108  Resp:  (!) 27 (!) 28 16  Temp:    98.3 F (36.8 C)  TempSrc:    Oral  SpO2:  99% 98% 98%  Weight:      Height:        Intake/Output Summary (Last 24 hours) at 07/19/2024 1002 Last data filed at 07/19/2024 9266 Gross per 24 hour  Intake 92.36 ml  Output --  Net 92.36 ml   Filed Weights   07/18/24 1509  Weight: 58.9 kg    Examination:  Physical Exam  Data Reviewed: I have personally reviewed following labs and imaging studies  CBC: Recent Labs  Lab  07/18/24 1530 07/18/24 1538 07/19/24 0257  WBC 11.2*  --  7.8  NEUTROABS 9.8*  --   --   HGB 11.1* 13.6 11.6*  HCT 35.6* 40.0 35.1*  MCV 81.7  --  76.6*  PLT 187  --  170   Basic Metabolic Panel: Recent Labs  Lab 07/18/24 1530 07/18/24 1538 07/19/24 0257  NA 131* 132* 136  K 4.2 4.1 3.4*  CL 95* 99 97*  CO2 18*  --  27  GLUCOSE 152* 141* 132*  BUN 6 5* <5*  CREATININE 0.69 0.60 0.55  CALCIUM  8.4*  --  8.4*  MG  --   --  2.0   GFR: Estimated Creatinine Clearance: 61.6 mL/min (by C-G formula based on SCr of 0.55 mg/dL). Liver Function Tests: Recent Labs  Lab 07/18/24 1530  AST 32  ALT 25  ALKPHOS 99  BILITOT 0.7  PROT 6.9  ALBUMIN  4.0   No results for input(s): LIPASE, AMYLASE in the last 168 hours. No results for input(s): AMMONIA in the last 168 hours. Coagulation Profile: No results for input(s): INR, PROTIME in the last 168 hours. Cardiac Enzymes: No results for input(s): CKTOTAL, CKMB, CKMBINDEX, TROPONINI in the last 168 hours. ProBNP, BNP (last 5 results) Recent Labs    12/09/23 0500 12/10/23 0405 12/11/23 0349 12/12/23 0427 01/23/24 1900 06/15/24 1340  PROBNP  --   --   --   --   --  137.0  BNP 33.4 33.8 55.2 95.6 17.0  --    HbA1C: No results for input(s): HGBA1C in the last 72 hours. CBG: Recent Labs  Lab 07/19/24 0304 07/19/24 0949  GLUCAP 137* 156*   Lipid Profile: No results for input(s): CHOL, HDL, LDLCALC, TRIG, CHOLHDL, LDLDIRECT in the last 72 hours. Thyroid  Function Tests: No results for input(s): TSH, T4TOTAL, FREET4, T3FREE, THYROIDAB in the last 72 hours. Anemia Panel: No results for input(s): VITAMINB12, FOLATE, FERRITIN, TIBC, IRON , RETICCTPCT in the last 72 hours. Sepsis Labs: Recent Labs  Lab 07/18/24 1538 07/18/24 1725 07/18/24 2316  LATICACIDVEN 6.4* 3.9* 1.0    Recent Results (from the past 240 hours)  Culture, blood (routine x 2)     Status: None  (Preliminary result)   Collection Time: 07/18/24  3:32 PM   Specimen: BLOOD RIGHT FOREARM  Result Value Ref Range Status   Specimen Description BLOOD RIGHT FOREARM  Final   Special Requests   Final    BOTTLES DRAWN AEROBIC AND ANAEROBIC Blood Culture results may not be optimal due to an inadequate volume of blood received in culture bottles   Culture   Final    NO GROWTH < 24 HOURS Performed at [facility], [address] [place], [place] [address]    Report Status PENDING  Incomplete  Culture, blood (routine x 2)  Status: None (Preliminary result)   Collection Time: 07/18/24  3:55 PM   Specimen: BLOOD LEFT FOREARM  Result Value Ref Range Status   Specimen Description BLOOD LEFT FOREARM  Final   Special Requests   Final    BOTTLES DRAWN AEROBIC AND ANAEROBIC Blood Culture results may not be optimal due to an inadequate volume of blood received in culture bottles   Culture   Final    NO GROWTH < 24 HOURS Performed at [facility], [address] [place], [place] [address]    Report Status PENDING  Incomplete  Resp panel by RT-PCR (RSV, Flu A&B, Covid) Anterior Nasal Swab     Status: Abnormal   Collection Time: 07/18/24  6:49 PM   Specimen: Anterior Nasal Swab  Result Value Ref Range Status   SARS Coronavirus 2 by RT PCR POSITIVE (A) NEGATIVE Final   Influenza A by PCR NEGATIVE NEGATIVE Final   Influenza B by PCR NEGATIVE NEGATIVE Final    Comment: (NOTE) The Xpert Xpress SARS-CoV-2/FLU/RSV plus assay is intended as an aid in the diagnosis of influenza from Nasopharyngeal swab specimens and should not be used as a sole basis for treatment. Nasal washings and aspirates are unacceptable for Xpert Xpress SARS-CoV-2/FLU/RSV testing.  Fact Sheet for Patients: bloggercourse.com  Fact Sheet for Healthcare Providers: seriousbroker.it  This test is not yet approved or cleared by the [place]  FDA and has been  authorized for detection and/or diagnosis of SARS-CoV-2 by FDA under an Emergency Use Authorization (EUA). This EUA will remain in effect (meaning this test can be used) for the duration of the COVID-19 declaration under Section 564(b)(1) of the Act, 21 U.S.C. section 360bbb-3(b)(1), unless the authorization is terminated or revoked.     Resp Syncytial Virus by PCR NEGATIVE NEGATIVE Final    Comment: (NOTE) Fact Sheet for Patients: bloggercourse.com  Fact Sheet for Healthcare Providers: seriousbroker.it  This test is not yet approved or cleared by the [place]  FDA and has been authorized for detection and/or diagnosis of SARS-CoV-2 by FDA under an Emergency Use Authorization (EUA). This EUA will remain in effect (meaning this test can be used) for the duration of the COVID-19 declaration under Section 564(b)(1) of the Act, 21 U.S.C. section 360bbb-3(b)(1), unless the authorization is terminated or revoked.  Performed at [facility], [address], [place], [place] [address]      Radiology Studies: Overnight EEG with video Result Date: 07/19/2024 Shelton Arlin KIDD, MD     07/19/2024  8:40 AM Patient Name: ESMA KILTS MRN: [id number] Epilepsy Attending: Arlin KIDD Shelton Referring Physician/Provider: everitt Clint Abbey Earle FORBES, NP Duration: 07/18/2024 1927 to 07/19/2024 [phone] Patient history: 59 y.o. female with hx of seizure disorder with noncompliance to medications who presents with 3 breakthrough seizures followed by a seizure witnessed by EMS and another seizure in the emergency department. EEG to evaluate for seizure Level of alertness: Awake, asleep AEDs during EEG study: LEV, Ativan  Technical aspects: This EEG study was done with scalp electrodes positioned according to the 10-20 International system of electrode placement. Electrical activity was reviewed with band pass filter of 1-70Hz , sensitivity of 7 uV/mm, display speed  of 46mm/sec with a 60Hz  notched filter applied as appropriate. EEG data were recorded continuously and digitally stored.  Video monitoring was available and reviewed as appropriate. Description: The posterior dominant rhythm consists of 8-9 Hz activity of moderate voltage (25-35 uV) seen predominantly in posterior head regions, symmetric and reactive to  eye opening and eye closing. Sleep was characterized by vertex waves, sleep spindles (12 to 14 Hz), maximal frontocentral region. EEG showed continuous generalized 3 to 6 Hz theta-delta slowing admixed with an excessive amount of 15 to 18 Hz beta activity distributed symmetrically and diffusely. Hyperventilation and photic stimulation were not performed.   ABNORMALITY - Continuous slow, generalized - Excessive beta, generalized IMPRESSION: This study is suggestive of generalized cerebral dysfunction (encephalopathy). The excessive beta activity seen in the background is most likely due to the effect of benzodiazepine and is a benign EEG pattern. No seizures or epileptiform discharges were seen throughout the recording. Arlin MALVA Krebs   DG CHEST PORT 1 VIEW Result Date: 07/19/2024 EXAM: 1 VIEW(S) XRAY OF THE CHEST 07/19/2024 07:13:47 AM COMPARISON: 07/18/2024 CLINICAL HISTORY: 87768 Foreign body in esophagus 12231 Foreign body in esophagus. ICD10: T18.1 Foreign body in esophagus. FINDINGS: LUNGS AND PLEURA: Mild atelectasis within the right base. No pleural effusion. No pneumothorax. HEART AND MEDIASTINUM: No acute abnormality of the cardiac and mediastinal silhouettes. BONES AND SOFT TISSUES: The previously noted rounded metallic foreign body is identified within the left upper quadrant of the abdomen approximately 5 cm below the expected location of the GE junction. No acute osseous abnormality. IMPRESSION: 1. Rounded metallic foreign body in the left upper quadrant of the abdomen, approximately 5 cm below the expected location of the GE junction.  Electronically signed by: Waddell Calk MD 07/19/2024 07:19 AM EST RP Workstation: [id number]   CT CHEST ABDOMEN PELVIS W CONTRAST Result Date: 07/18/2024 EXAM: CT CHEST, ABDOMEN AND PELVIS WITH CONTRAST 07/18/2024 09:33:52 PM TECHNIQUE: CT of the chest, abdomen and pelvis was performed with the administration of 75 mL of iohexol  (OMNIPAQUE ) 350 MG/ML injection. Multiplanar reformatted images are provided for review. Automated exposure control, iterative reconstruction, and/or weight based adjustment of the mA/kV was utilized to reduce the radiation dose to as low as reasonably achievable. COMPARISON: Comparison with chest radiograph 07/18/2024, CT chest 06/15/2024, and CT abdomen and pelvis 06/08/2024. CLINICAL HISTORY: Sepsis. FINDINGS: CHEST: MEDIASTINUM AND LYMPH NODES: Heart size is normal. No pericardial effusions. Esophagus is decompressed. Normal caliber thoracic aorta. No dissection. Calcification of the aorta and coronary arteries. No significant mediastinal, hilar or axillary lymphadenopathy. The thyroid  gland is unremarkable. LUNGS AND PLEURA: Mild atelectasis in the lung bases. Motion artifact in the lung bases. No focal consolidation or pulmonary edema. No pleural effusion. No pneumothorax. ABDOMEN AND PELVIS: LIVER: Normal. GALLBLADDER AND BILE DUCTS: Normal. No biliary ductal dilatation. SPLEEN: Normal. PANCREAS: Normal. ADRENAL GLANDS: Normal. KIDNEYS, URETERS AND BLADDER: Kidneys, ureters, and bladder are normal. No stones in the kidneys or ureters. No hydronephrosis. No perinephric or periureteral stranding. GI AND BOWEL: Postoperative changes consistent with gastric bypass. Small esophageal hiatal hernia. Within the hernia, there is a rounded metallic structure possibly representing an ingested foreign body such as a coin. The stomach, small bowel, and colon are mostly decompressed. No wall thickening or inflammatory stranding are appreciated. The appendix is not demonstrated. Broad-based  midline anterior abdominal wall hernia contains a portion of the transverse colon without proximal obstruction. There is no bowel obstruction. REPRODUCTIVE ORGANS: The uterus is surgically absent. No abnormal adnexal masses. PERITONEUM AND RETROPERITONEUM: No ascites. No free air. No retroperitoneal lymphadenopathy. VASCULATURE: Aorta is normal in caliber. Calcification of the aorta. No aneurysm. ABDOMINAL AND PELVIS LYMPH NODES: No significant pelvic lymphadenopathy. No retroperitoneal lymphadenopathy. BONES AND SOFT TISSUES: Postoperative right hip arthroplasty. Compression deformities at L1, T9, T6, and T4 vertebrae. No change since  prior study. No acute compression is identified. Multiple compression fractures may indicate osteoporosis. No focal soft tissue abnormality. IMPRESSION: 1. No acute findings. 2. Ingested foreign body such as a coin in the distal esophagus. 3. Broad-based midline anterior abdominal wall hernia containing a portion of the transverse colon without proximal obstruction. Electronically signed by: Elsie Gravely MD 07/18/2024 09:58 PM EST RP Workstation: [id number]   CT Head Wo Contrast Result Date: 07/18/2024 CLINICAL DATA:  Seizure disorder EXAM: CT HEAD WITHOUT CONTRAST TECHNIQUE: Contiguous axial images were obtained from the base of the skull through the vertex without intravenous contrast. RADIATION DOSE REDUCTION: This exam was performed according to the departmental dose-optimization program which includes automated exposure control, adjustment of the mA and/or kV according to patient size and/or use of iterative reconstruction technique. COMPARISON:  CT brain 06/15/2024 FINDINGS: Brain: No acute territorial infarction, hemorrhage or intracranial mass. The ventricles are nonenlarged. Vascular: No hyperdense vessels.  Carotid vascular calcification Skull: Normal. Negative for fracture or focal lesion. Sinuses/Orbits: No acute finding. Other: None IMPRESSION: No CT evidence for  acute intracranial abnormality Electronically Signed   By: Luke Bun M.D.   On: 07/18/2024 21:40   DG Chest Portable 1 View Result Date: 07/18/2024 CLINICAL DATA:  Seizures, concern for aspiration. EXAM: PORTABLE CHEST 1 VIEW COMPARISON:  CT chest 06/15/2024 and chest radiograph 06/15/2024. FINDINGS: Patient's head obscures the superior mediastinum. Heart is enlarged, stable. Lungs are low in volume with minimal bibasilar streaky atelectasis. A rounded metallic density projects in the region of the gastroesophageal junction or proximal stomach. IMPRESSION: 1. Low lung volumes with bibasilar subsegmental atelectasis. 2. Rounded metallic structure projecting over the gastroesophageal junction or proximal stomach may be external to the patient. Ingested foreign body cannot be excluded. Please correlate clinically. Electronically Signed   By: Newell Eke M.D.   On: 07/18/2024 16:11    Scheduled Meds:  insulin  aspart  0-6 Units Subcutaneous Q4H   levETIRAcetam   500 mg Intravenous Q12H   LORazepam   0-4 mg Intravenous Q4H   Followed by   NOREEN ON 07/21/2024] LORazepam   0-4 mg Intravenous Q8H   sodium chloride  flush  3 mL Intravenous Q12H   thiamine   100 mg Oral Daily   Or   thiamine   100 mg Intravenous Daily   Continuous Infusions:  sodium chloride  Stopped (07/19/24 0746)     LOS: 0 days   Time spent: 35 minutes  Lonni KANDICE Moose, MD  [facility]  07/19/2024, 10:02 AM   "

## 2024-07-20 ENCOUNTER — Inpatient Hospital Stay (HOSPITAL_COMMUNITY)

## 2024-07-20 DIAGNOSIS — T189XXS Foreign body of alimentary tract, part unspecified, sequela: Secondary | ICD-10-CM

## 2024-07-20 DIAGNOSIS — Z91199 Patient's noncompliance with other medical treatment and regimen due to unspecified reason: Secondary | ICD-10-CM

## 2024-07-20 DIAGNOSIS — Z9884 Bariatric surgery status: Secondary | ICD-10-CM

## 2024-07-20 LAB — BASIC METABOLIC PANEL WITH GFR
Anion gap: 12 (ref 5–15)
BUN: <5 mg/dL — ABNORMAL LOW (ref 6–20)
CO2: 24 mmol/L (ref 22–32)
Calcium: 9 mg/dL (ref 8.9–10.3)
Chloride: 98 mmol/L (ref 98–111)
Creatinine, Ser: 0.65 mg/dL (ref 0.44–1.00)
GFR, Estimated: >60 mL/min
Glucose, Bld: 110 mg/dL — ABNORMAL HIGH (ref 70–99)
Potassium: 4 mmol/L (ref 3.5–5.1)
Sodium: 135 mmol/L (ref 135–145)

## 2024-07-20 LAB — GLUCOSE, CAPILLARY
Glucose-Capillary: 125 mg/dL — ABNORMAL HIGH (ref 70–99)
Glucose-Capillary: 129 mg/dL — ABNORMAL HIGH (ref 70–99)
Glucose-Capillary: 133 mg/dL — ABNORMAL HIGH (ref 70–99)
Glucose-Capillary: 134 mg/dL — ABNORMAL HIGH (ref 70–99)
Glucose-Capillary: 145 mg/dL — ABNORMAL HIGH (ref 70–99)

## 2024-07-20 LAB — CBC
HCT: 38.6 % (ref 36.0–46.0)
Hemoglobin: 12.1 g/dL (ref 12.0–15.0)
MCH: 25.2 pg — ABNORMAL LOW (ref 26.0–34.0)
MCHC: 31.3 g/dL (ref 30.0–36.0)
MCV: 80.4 fL (ref 80.0–100.0)
Platelets: 172 10*3/uL (ref 150–400)
RBC: 4.8 MIL/uL (ref 3.87–5.11)
RDW: 21.7 % — ABNORMAL HIGH (ref 11.5–15.5)
WBC: 6.7 10*3/uL (ref 4.0–10.5)
nRBC: 0 % (ref 0.0–0.2)

## 2024-07-20 MED ORDER — BISACODYL 5 MG PO TBEC
10.0000 mg | DELAYED_RELEASE_TABLET | Freq: Once | ORAL | Status: AC
  Administered 2024-07-20: 10 mg via ORAL
  Filled 2024-07-20: qty 2

## 2024-07-20 MED ORDER — POLYETHYLENE GLYCOL 3350 17 G PO PACK
17.0000 g | PACK | Freq: Two times a day (BID) | ORAL | Status: DC
  Administered 2024-07-20 – 2024-07-21 (×3): 17 g via ORAL
  Filled 2024-07-20 (×3): qty 1

## 2024-07-20 MED ORDER — LEVETIRACETAM 500 MG PO TABS
500.0000 mg | ORAL_TABLET | Freq: Two times a day (BID) | ORAL | Status: DC
  Administered 2024-07-20 – 2024-07-21 (×3): 500 mg via ORAL
  Filled 2024-07-20 (×3): qty 1

## 2024-07-20 MED ORDER — PROMETHAZINE (PHENERGAN) 6.25MG IN NS 50ML IVPB: 6.2500 mg | Freq: Four times a day (QID) | INTRAVENOUS | Status: DC | PRN

## 2024-07-20 MED ORDER — INSULIN ASPART 100 UNIT/ML IJ SOLN: 0.0000 [IU] | Freq: Every day | INTRAMUSCULAR | Status: DC

## 2024-07-20 MED ORDER — INSULIN ASPART 100 UNIT/ML IJ SOLN: 0.0000 [IU] | Freq: Three times a day (TID) | INTRAMUSCULAR | Status: DC

## 2024-07-20 MED ORDER — NICOTINE 14 MG/24HR TD PT24
14.0000 mg | MEDICATED_PATCH | Freq: Every day | TRANSDERMAL | Status: DC
  Administered 2024-07-20 – 2024-07-21 (×2): 14 mg via TRANSDERMAL
  Filled 2024-07-20 (×2): qty 1

## 2024-07-20 NOTE — Progress Notes (Signed)
 " PROGRESS NOTE    [name]  FMW:[id number] DOB: [birth year] DOA: 07/18/2024 PCP: [name], NP   Chief Complaint: Seizures    HPI: [name] is a 59 y.o. female with medical history significant for COPD, chronic hypoxic respiratory failure, alcohol  and drug abuse, hypothyroidism, seizures, and history of psychosis who presents for evaluation of seizures.   Patient has reportedly been staying with a family member for the past 2 days but did not bring her medications with her and was noted to have multiple seizures today.  Patient had an apparent seizure at home this morning, 2 more prior to EMS arrival, and a fourth when EMS arrived.   ED Course: Upon arrival to the ED, patient is found to be febrile to 38.0 C and saturating well on 5 L/min of supplemental oxygen  with elevated heart rate and stable blood pressure.  Labs are most notable for normal creatinine, lactic acid 6.4, undetectable valproic  acid level, and positive COVID-19 PCR.  Head CT was negative for acute findings.  CT of the chest, abdomen, and pelvis is concerning for coin or other object in the distal esophagus.   Neurology was consulted and evaluated the patient in the ED.  GI (Dr. Albertus) was also consulted by the ED physician and recommended keeping the patient n.p.o. and repeating chest x-ray in the morning.   Blood cultures were collected and the patient was treated with acetaminophen , IV Keppra , 2 L LR, IV Ativan , thiamine , Depakote , vancomycin , and Zosyn .  Subjective:  Patient sitting on the side of the bed with phlebotomist attempting to obtain blood draw.  She denies any complaints.  Specifically she denies chest pain shortness of breath abdominal pain fever or chills.  No seizures reported.  GI is watching to a foreign body passed through her small bowel.  I assume once he gets to the large bowel it should be free to pass.  Daughter related yeah last evening that the her mother is usually  tachycardic especially in the evening that is when she drinks most of her alcohol .  No frank withdrawal symptoms.  No indication for remdesivir.  Is at her baseline oxygen  level.  Hospital Course:      Assessment and Plan:   1. Breakthrough seizures  - Appreciate neurology consultation  - No acute findings on head CT  -Peers to have resolved.  Continue IV Keppra  overnight and consider discharge in the morning. - Continue Keppra  at home dose, will use IV formulation for now given her somnolence, monitor for and treat alcohol  withdrawal     2. Foreign body in distal esophagus  - Coin or other objected noted in distal esophagus on CT in ED  - GI recommends keeping patient NPO  -repeat CXR shows FB is moving.   3. Alcohol  dependence and drug abuse  - Monitor with CIWA, use Ativan  as-needed, supplement vitamins, consult TOC  - Currently no tremors diaphoresis or confusion.  No asterixis   4. COPD; chronic hypoxic respiratory failure  -Uses 5 to 6 L/min at home. - Not in exacerbation  - Continue supplemental O2, use DuoNebs as-needed     5. Type II DM  - A1c was 6.2% in December 2025  - Check CBGs and use low-intensity SSI for now      DVT prophylaxis: Lovenox   Code Status: Full  Level of Care: Level of care: Progressive Family Communication: Daughter updated from ED   Disposition Plan:  Patient is from: Home  Anticipated d/c  is to: Home  Anticipated d/c date is: Possibly 128 if foreign body passes to her large bowel. Patient currently: Pending GI consultation and clinical improvement Consults called: GI, Neurology Admission status: Observation    DVT prophylaxis: SCDs Start: 07/19/24 0151  Reason for continuing need for hospitalization: Monitoring for further seizures and EtOH withdrawal  Objective: Vitals:   07/20/24 0010 07/20/24 0400 07/20/24 0816 07/20/24 1119  BP: 96/72 106/78 138/83 109/72  Pulse: (!) 110 (!) 122 (!) 122   Resp: (!) 24 (!) 22 20 20   Temp:  98.4 F (36.9 C)  98.2 F (36.8 C) 98.2 F (36.8 C)  TempSrc: Axillary  Oral Oral  SpO2: 94% 96% 98% 95%  Weight:      Height:        Intake/Output Summary (Last 24 hours) at 07/20/2024 1136 Last data filed at 07/20/2024 0500 Gross per 24 hour  Intake 1936 ml  Output --  Net 1936 ml   Filed Weights   07/18/24 1509  Weight: 58.9 kg    Examination:  Awake, alert present No psychomotor agitation No diaphoresis Work of breathing is adequate, able to speak in complete sentences. No scleral icterus Regular rhythm tachycardic no murmurs rubs gallops Lungs are clear throughout Abdomen: Is soft bowel sounds are present Extremities without edema Neurologic: No ongoing seizures.  Conversant coherent moving all extremities.  Data Reviewed: I have personally reviewed following labs and imaging studies  CBC: Recent Labs  Lab 07/18/24 1530 07/18/24 1538 07/19/24 0257  WBC 11.2*  --  7.8  NEUTROABS 9.8*  --   --   HGB 11.1* 13.6 11.6*  HCT 35.6* 40.0 35.1*  MCV 81.7  --  76.6*  PLT 187  --  170   Basic Metabolic Panel: Recent Labs  Lab 07/18/24 1530 07/18/24 1538 07/19/24 0257  NA 131* 132* 136  K 4.2 4.1 3.4*  CL 95* 99 97*  CO2 18*  --  27  GLUCOSE 152* 141* 132*  BUN 6 5* <5*  CREATININE 0.69 0.60 0.55  CALCIUM  8.4*  --  8.4*  MG  --   --  2.0   GFR: Estimated Creatinine Clearance: 61.6 mL/min (by C-G formula based on SCr of 0.55 mg/dL). Liver Function Tests: Recent Labs  Lab 07/18/24 1530  AST 32  ALT 25  ALKPHOS 99  BILITOT 0.7  PROT 6.9  ALBUMIN  4.0   No results for input(s): LIPASE, AMYLASE in the last 168 hours. No results for input(s): AMMONIA in the last 168 hours. Coagulation Profile: No results for input(s): INR, PROTIME in the last 168 hours. Cardiac Enzymes: No results for input(s): CKTOTAL, CKMB, CKMBINDEX, TROPONINI in the last 168 hours. ProBNP, BNP (last 5 results) Recent Labs    12/09/23 0500 12/10/23 0405  12/11/23 0349 12/12/23 0427 01/23/24 1900 06/15/24 1340  PROBNP  --   --   --   --   --  137.0  BNP 33.4 33.8 55.2 95.6 17.0  --    HbA1C: No results for input(s): HGBA1C in the last 72 hours. CBG: Recent Labs  Lab 07/19/24 2042 07/20/24 0004 07/20/24 0403 07/20/24 0803 07/20/24 1116  GLUCAP 207* 133* 129* 145* 125*   Lipid Profile: No results for input(s): CHOL, HDL, LDLCALC, TRIG, CHOLHDL, LDLDIRECT in the last 72 hours. Thyroid  Function Tests: No results for input(s): TSH, T4TOTAL, FREET4, T3FREE, THYROIDAB in the last 72 hours. Anemia Panel: No results for input(s): VITAMINB12, FOLATE, FERRITIN, TIBC, IRON , RETICCTPCT in the last 72 hours.  Sepsis Labs: Recent Labs  Lab 07/18/24 1538 07/18/24 1725 07/18/24 2316  LATICACIDVEN 6.4* 3.9* 1.0    Recent Results (from the past 240 hours)  Culture, blood (routine x 2)     Status: None (Preliminary result)   Collection Time: 07/18/24  3:32 PM   Specimen: BLOOD RIGHT FOREARM  Result Value Ref Range Status   Specimen Description BLOOD RIGHT FOREARM  Final   Special Requests   Final    BOTTLES DRAWN AEROBIC AND ANAEROBIC Blood Culture results may not be optimal due to an inadequate volume of blood received in culture bottles   Culture   Final    NO GROWTH 2 DAYS Performed at [facility], [address], [place], [place] [address]    Report Status PENDING  Incomplete  Culture, blood (routine x 2)     Status: None (Preliminary result)   Collection Time: 07/18/24  3:55 PM   Specimen: BLOOD LEFT FOREARM  Result Value Ref Range Status   Specimen Description BLOOD LEFT FOREARM  Final   Special Requests   Final    BOTTLES DRAWN AEROBIC AND ANAEROBIC Blood Culture results may not be optimal due to an inadequate volume of blood received in culture bottles   Culture   Final    NO GROWTH 2 DAYS Performed at [facility], [address] [place], [place] [address]    Report  Status PENDING  Incomplete  Resp panel by RT-PCR (RSV, Flu A&B, Covid) Anterior Nasal Swab     Status: Abnormal   Collection Time: 07/18/24  6:49 PM   Specimen: Anterior Nasal Swab  Result Value Ref Range Status   SARS Coronavirus 2 by RT PCR POSITIVE (A) NEGATIVE Final   Influenza A by PCR NEGATIVE NEGATIVE Final   Influenza B by PCR NEGATIVE NEGATIVE Final    Comment: (NOTE) The Xpert Xpress SARS-CoV-2/FLU/RSV plus assay is intended as an aid in the diagnosis of influenza from Nasopharyngeal swab specimens and should not be used as a sole basis for treatment. Nasal washings and aspirates are unacceptable for Xpert Xpress SARS-CoV-2/FLU/RSV testing.  Fact Sheet for Patients: bloggercourse.com  Fact Sheet for Healthcare Providers: seriousbroker.it  This test is not yet approved or cleared by the [place]  FDA and has been authorized for detection and/or diagnosis of SARS-CoV-2 by FDA under an Emergency Use Authorization (EUA). This EUA will remain in effect (meaning this test can be used) for the duration of the COVID-19 declaration under Section 564(b)(1) of the Act, 21 U.S.C. section 360bbb-3(b)(1), unless the authorization is terminated or revoked.     Resp Syncytial Virus by PCR NEGATIVE NEGATIVE Final    Comment: (NOTE) Fact Sheet for Patients: bloggercourse.com  Fact Sheet for Healthcare Providers: seriousbroker.it  This test is not yet approved or cleared by the [place]  FDA and has been authorized for detection and/or diagnosis of SARS-CoV-2 by FDA under an Emergency Use Authorization (EUA). This EUA will remain in effect (meaning this test can be used) for the duration of the COVID-19 declaration under Section 564(b)(1) of the Act, 21 U.S.C. section 360bbb-3(b)(1), unless the authorization is terminated or revoked.  Performed at [facility],  [address], [place], [place] [address]      Radiology Studies: DG CHEST PORT 1 VIEW Result Date: 07/19/2024 EXAM: 1 VIEW(S) XRAY OF THE CHEST 07/19/2024 07:06:00 PM COMPARISON: 07/19/2024 CLINICAL HISTORY: COVID-19 positivity. FINDINGS: LUNGS AND PLEURA: Lungs are well aerated without focal infiltrate or effusion.  No pneumothorax. HEART AND MEDIASTINUM: Cardiac shadow is stable. No acute abnormality of the mediastinal silhouette. BONES AND SOFT TISSUES: No acute osseous abnormality. IMPRESSION: 1. No acute findings. Electronically signed by: Oneil Devonshire MD 07/19/2024 11:45 PM EST RP Workstation: [id number] 1 View Result Date: 07/19/2024 CLINICAL DATA:  59 year old female with foreign body in the alimentary tract. EXAM: ABDOMEN - 1 VIEW COMPARISON:  05/28/2024, 07/18/2024 FINDINGS: The bowel gas pattern is normal. There is an approximately 2.2 cm linear metallic object in the right mid abdomen compatible with foreign body visualized on recent comparison CT. IMPRESSION: Approximately 2.2 cm linear metallic object in the right mid abdomen compatible with foreign body visualized on recent comparison CT. No radiographic evidence of bowel obstruction. Electronically Signed   By: Ester Sides M.D.   On: 07/19/2024 13:05   Overnight EEG with video Result Date: 07/19/2024 Shelton Arlin KIDD, MD     07/19/2024  5:53 PM Patient Name: KEATON STIREWALT MRN: [id number] Epilepsy Attending: Arlin KIDD Shelton Referring Physician/Provider: everitt Clint Abbey Earle FORBES, NP Duration: 07/18/2024 1927 to 07/19/2024 1443 Patient history: 59 y.o. female with hx of seizure disorder with noncompliance to medications who presents with 3 breakthrough seizures followed by a seizure witnessed by EMS and another seizure in the emergency department. EEG to evaluate for seizure Level of alertness: Awake, asleep AEDs during EEG study: LEV, Ativan  Technical aspects: This EEG study was done with scalp electrodes positioned according to the  10-20 International system of electrode placement. Electrical activity was reviewed with band pass filter of 1-70Hz , sensitivity of 7 uV/mm, display speed of 72mm/sec with a 60Hz  notched filter applied as appropriate. EEG data were recorded continuously and digitally stored.  Video monitoring was available and reviewed as appropriate. Description: The posterior dominant rhythm consists of 8-9 Hz activity of moderate voltage (25-35 uV) seen predominantly in posterior head regions, symmetric and reactive to eye opening and eye closing. Sleep was characterized by vertex waves, sleep spindles (12 to 14 Hz), maximal frontocentral region. EEG showed continuous generalized 3 to 6 Hz theta-delta slowing admixed with an excessive amount of 15 to 18 Hz beta activity distributed symmetrically and diffusely. Hyperventilation and photic stimulation were not performed.   EEG was disconnected between 07/19/2024 0817 to 1234 for testing ABNORMALITY - Continuous slow, generalized - Excessive beta, generalized IMPRESSION: This study is suggestive of generalized cerebral dysfunction (encephalopathy). The excessive beta activity seen in the background is most likely due to the effect of benzodiazepine and is a benign EEG pattern. No seizures or epileptiform discharges were seen throughout the recording. Arlin KIDD Shelton   DG CHEST PORT 1 VIEW Result Date: 07/19/2024 EXAM: 1 VIEW(S) XRAY OF THE CHEST 07/19/2024 07:13:47 AM COMPARISON: 07/18/2024 CLINICAL HISTORY: 87768 Foreign body in esophagus 12231 Foreign body in esophagus. ICD10: T18.1 Foreign body in esophagus. FINDINGS: LUNGS AND PLEURA: Mild atelectasis within the right base. No pleural effusion. No pneumothorax. HEART AND MEDIASTINUM: No acute abnormality of the cardiac and mediastinal silhouettes. BONES AND SOFT TISSUES: The previously noted rounded metallic foreign body is identified within the left upper quadrant of the abdomen approximately 5 cm below the expected location  of the GE junction. No acute osseous abnormality. IMPRESSION: 1. Rounded metallic foreign body in the left upper quadrant of the abdomen, approximately 5 cm below the expected location of the GE junction. Electronically signed by: Waddell Calk MD 07/19/2024 07:19 AM EST RP Workstation: [id number]   CT CHEST ABDOMEN PELVIS W CONTRAST Result  Date: 07/18/2024 EXAM: CT CHEST, ABDOMEN AND PELVIS WITH CONTRAST 07/18/2024 09:33:52 PM TECHNIQUE: CT of the chest, abdomen and pelvis was performed with the administration of 75 mL of iohexol  (OMNIPAQUE ) 350 MG/ML injection. Multiplanar reformatted images are provided for review. Automated exposure control, iterative reconstruction, and/or weight based adjustment of the mA/kV was utilized to reduce the radiation dose to as low as reasonably achievable. COMPARISON: Comparison with chest radiograph 07/18/2024, CT chest 06/15/2024, and CT abdomen and pelvis 06/08/2024. CLINICAL HISTORY: Sepsis. FINDINGS: CHEST: MEDIASTINUM AND LYMPH NODES: Heart size is normal. No pericardial effusions. Esophagus is decompressed. Normal caliber thoracic aorta. No dissection. Calcification of the aorta and coronary arteries. No significant mediastinal, hilar or axillary lymphadenopathy. The thyroid  gland is unremarkable. LUNGS AND PLEURA: Mild atelectasis in the lung bases. Motion artifact in the lung bases. No focal consolidation or pulmonary edema. No pleural effusion. No pneumothorax. ABDOMEN AND PELVIS: LIVER: Normal. GALLBLADDER AND BILE DUCTS: Normal. No biliary ductal dilatation. SPLEEN: Normal. PANCREAS: Normal. ADRENAL GLANDS: Normal. KIDNEYS, URETERS AND BLADDER: Kidneys, ureters, and bladder are normal. No stones in the kidneys or ureters. No hydronephrosis. No perinephric or periureteral stranding. GI AND BOWEL: Postoperative changes consistent with gastric bypass. Small esophageal hiatal hernia. Within the hernia, there is a rounded metallic structure possibly representing an  ingested foreign body such as a coin. The stomach, small bowel, and colon are mostly decompressed. No wall thickening or inflammatory stranding are appreciated. The appendix is not demonstrated. Broad-based midline anterior abdominal wall hernia contains a portion of the transverse colon without proximal obstruction. There is no bowel obstruction. REPRODUCTIVE ORGANS: The uterus is surgically absent. No abnormal adnexal masses. PERITONEUM AND RETROPERITONEUM: No ascites. No free air. No retroperitoneal lymphadenopathy. VASCULATURE: Aorta is normal in caliber. Calcification of the aorta. No aneurysm. ABDOMINAL AND PELVIS LYMPH NODES: No significant pelvic lymphadenopathy. No retroperitoneal lymphadenopathy. BONES AND SOFT TISSUES: Postoperative right hip arthroplasty. Compression deformities at L1, T9, T6, and T4 vertebrae. No change since prior study. No acute compression is identified. Multiple compression fractures may indicate osteoporosis. No focal soft tissue abnormality. IMPRESSION: 1. No acute findings. 2. Ingested foreign body such as a coin in the distal esophagus. 3. Broad-based midline anterior abdominal wall hernia containing a portion of the transverse colon without proximal obstruction. Electronically signed by: Elsie Gravely MD 07/18/2024 09:58 PM EST RP Workstation: [id number]   CT Head Wo Contrast Result Date: 07/18/2024 CLINICAL DATA:  Seizure disorder EXAM: CT HEAD WITHOUT CONTRAST TECHNIQUE: Contiguous axial images were obtained from the base of the skull through the vertex without intravenous contrast. RADIATION DOSE REDUCTION: This exam was performed according to the departmental dose-optimization program which includes automated exposure control, adjustment of the mA and/or kV according to patient size and/or use of iterative reconstruction technique. COMPARISON:  CT brain 06/15/2024 FINDINGS: Brain: No acute territorial infarction, hemorrhage or intracranial mass. The ventricles are  nonenlarged. Vascular: No hyperdense vessels.  Carotid vascular calcification Skull: Normal. Negative for fracture or focal lesion. Sinuses/Orbits: No acute finding. Other: None IMPRESSION: No CT evidence for acute intracranial abnormality Electronically Signed   By: Luke Bun M.D.   On: 07/18/2024 21:40   DG Chest Portable 1 View Result Date: 07/18/2024 CLINICAL DATA:  Seizures, concern for aspiration. EXAM: PORTABLE CHEST 1 VIEW COMPARISON:  CT chest 06/15/2024 and chest radiograph 06/15/2024. FINDINGS: Patient's head obscures the superior mediastinum. Heart is enlarged, stable. Lungs are low in volume with minimal bibasilar streaky atelectasis. A rounded metallic density projects in the region of  the gastroesophageal junction or proximal stomach. IMPRESSION: 1. Low lung volumes with bibasilar subsegmental atelectasis. 2. Rounded metallic structure projecting over the gastroesophageal junction or proximal stomach may be external to the patient. Ingested foreign body cannot be excluded. Please correlate clinically. Electronically Signed   By: Newell Eke M.D.   On: 07/18/2024 16:11    Scheduled Meds:  bisacodyl   10 mg Oral Once   insulin  aspart  0-5 Units Subcutaneous QHS   insulin  aspart  0-6 Units Subcutaneous TID WC   levETIRAcetam   500 mg Oral BID   LORazepam   0-4 mg Intravenous Q4H   Followed by   NOREEN ON 07/21/2024] LORazepam   0-4 mg Intravenous Q8H   polyethylene glycol  17 g Oral BID   sodium chloride  flush  3 mL Intravenous Q12H   thiamine   100 mg Oral Daily   Or   thiamine   100 mg Intravenous Daily   Continuous Infusions:     LOS: 1 day   Time spent: 35 minutes  Lonni KANDICE Moose, MD  [facility]  07/20/2024, 11:36 AM   "

## 2024-07-20 NOTE — Progress Notes (Cosign Needed)
 Patient ID: [name], female   DOB: [birth year], 59 y.o.   MRN: [id number]    Progress Note   Subjective  Day # 2 CC; breakthrough seizures, EtOH withdrawal, acute COVID-19 infection, foreign body ingestion  Sitter at bedside, patient alert and cooperative this morning, asking for solid food denies any abdominal discomfort, small bowel movement yesterday none since.  Abdominal film this a.m. -report pending KUB last p.m.-2.2 cm metallic object in the right mid abdomen, no radiographic evidence of bowel obstruction   Objective   Vital signs in last 24 hours: Temp:  [97.9 F (36.6 C)-98.7 F (37.1 C)] 98.2 F (36.8 C) (01/28 0816) Pulse Rate:  [94-134] 122 (01/28 0816) Resp:  [14-36] 20 (01/28 0816) BP: (96-138)/(61-91) 138/83 (01/28 0816) SpO2:  [94 %-100 %] 98 % (01/28 0816) Last BM Date : 07/19/24 General:    Older African-American female in NAD pleasant, cooperative today no agitation Heart: Tachycardic regular rate and rhythm; no murmurs Lungs: Respirations even and unlabored, lungs CTA bilaterally Abdomen:  Soft, nontender and nondistended. Normal bowel sounds. Extremities:  Without edema. Neurologic:  Alert and oriented,  grossly normal neurologically. Psych:  Cooperative. Normal mood and affect.  Intake/Output from previous day: 01/27 0701 - 01/28 0700 In: 2031.4 [P.O.:1936; I.V.:3; IV Piggyback:92.4] Out: -  Intake/Output this shift: No intake/output data recorded.  Lab Results: Recent Labs    07/18/24 1530 07/18/24 1538 07/19/24 0257  WBC 11.2*  --  7.8  HGB 11.1* 13.6 11.6*  HCT 35.6* 40.0 35.1*  PLT 187  --  170   BMET Recent Labs    07/18/24 1530 07/18/24 1538 07/19/24 0257  NA 131* 132* 136  K 4.2 4.1 3.4*  CL 95* 99 97*  CO2 18*  --  27  GLUCOSE 152* 141* 132*  BUN 6 5* <5*  CREATININE 0.69 0.60 0.55  CALCIUM  8.4*  --  8.4*   LFT Recent Labs    07/18/24 1530  PROT 6.9  ALBUMIN  4.0  AST 32  ALT 25  ALKPHOS 99  BILITOT 0.7    PT/INR No results for input(s): LABPROT, INR in the last 72 hours.  Studies/Results: DG CHEST PORT 1 VIEW Result Date: 07/19/2024 EXAM: 1 VIEW(S) XRAY OF THE CHEST 07/19/2024 07:06:00 PM COMPARISON: 07/19/2024 CLINICAL HISTORY: COVID-19 positivity. FINDINGS: LUNGS AND PLEURA: Lungs are well aerated without focal infiltrate or effusion. No pneumothorax. HEART AND MEDIASTINUM: Cardiac shadow is stable. No acute abnormality of the mediastinal silhouette. BONES AND SOFT TISSUES: No acute osseous abnormality. IMPRESSION: 1. No acute findings. Electronically signed by: Oneil Devonshire MD 07/19/2024 11:45 PM EST RP Workstation: [id number] 1 View Result Date: 07/19/2024 CLINICAL DATA:  59 year old female with foreign body in the alimentary tract. EXAM: ABDOMEN - 1 VIEW COMPARISON:  05/28/2024, 07/18/2024 FINDINGS: The bowel gas pattern is normal. There is an approximately 2.2 cm linear metallic object in the right mid abdomen compatible with foreign body visualized on recent comparison CT. IMPRESSION: Approximately 2.2 cm linear metallic object in the right mid abdomen compatible with foreign body visualized on recent comparison CT. No radiographic evidence of bowel obstruction. Electronically Signed   By: Ester Sides M.D.   On: 07/19/2024 13:05   Overnight EEG with video Result Date: 07/19/2024 Shelton Arlin KIDD, MD     07/19/2024  5:53 PM Patient Name: [name] MRN: [id number] Epilepsy Attending: Arlin KIDD Shelton Referring Physician/Provider: everitt Clint Abbey Earle FORBES, NP Duration: 07/18/2024 1927 to 07/19/2024 1443 Patient history:  59 y.o. female with hx of seizure disorder with noncompliance to medications who presents with 3 breakthrough seizures followed by a seizure witnessed by EMS and another seizure in the emergency department. EEG to evaluate for seizure Level of alertness: Awake, asleep AEDs during EEG study: LEV, Ativan  Technical aspects: This EEG study was done with scalp electrodes  positioned according to the 10-20 International system of electrode placement. Electrical activity was reviewed with band pass filter of 1-70Hz , sensitivity of 7 uV/mm, display speed of 64mm/sec with a 60Hz  notched filter applied as appropriate. EEG data were recorded continuously and digitally stored.  Video monitoring was available and reviewed as appropriate. Description: The posterior dominant rhythm consists of 8-9 Hz activity of moderate voltage (25-35 uV) seen predominantly in posterior head regions, symmetric and reactive to eye opening and eye closing. Sleep was characterized by vertex waves, sleep spindles (12 to 14 Hz), maximal frontocentral region. EEG showed continuous generalized 3 to 6 Hz theta-delta slowing admixed with an excessive amount of 15 to 18 Hz beta activity distributed symmetrically and diffusely. Hyperventilation and photic stimulation were not performed.   EEG was disconnected between 07/19/2024 0817 to 1234 for testing ABNORMALITY - Continuous slow, generalized - Excessive beta, generalized IMPRESSION: This study is suggestive of generalized cerebral dysfunction (encephalopathy). The excessive beta activity seen in the background is most likely due to the effect of benzodiazepine and is a benign EEG pattern. No seizures or epileptiform discharges were seen throughout the recording. Arlin MALVA Krebs   DG CHEST PORT 1 VIEW Result Date: 07/19/2024 EXAM: 1 VIEW(S) XRAY OF THE CHEST 07/19/2024 07:13:47 AM COMPARISON: 07/18/2024 CLINICAL HISTORY: 87768 Foreign body in esophagus 12231 Foreign body in esophagus. ICD10: T18.1 Foreign body in esophagus. FINDINGS: LUNGS AND PLEURA: Mild atelectasis within the right base. No pleural effusion. No pneumothorax. HEART AND MEDIASTINUM: No acute abnormality of the cardiac and mediastinal silhouettes. BONES AND SOFT TISSUES: The previously noted rounded metallic foreign body is identified within the left upper quadrant of the abdomen approximately 5 cm  below the expected location of the GE junction. No acute osseous abnormality. IMPRESSION: 1. Rounded metallic foreign body in the left upper quadrant of the abdomen, approximately 5 cm below the expected location of the GE junction. Electronically signed by: Waddell Calk MD 07/19/2024 07:19 AM EST RP Workstation: [id number]   CT CHEST ABDOMEN PELVIS W CONTRAST Result Date: 07/18/2024 EXAM: CT CHEST, ABDOMEN AND PELVIS WITH CONTRAST 07/18/2024 09:33:52 PM TECHNIQUE: CT of the chest, abdomen and pelvis was performed with the administration of 75 mL of iohexol  (OMNIPAQUE ) 350 MG/ML injection. Multiplanar reformatted images are provided for review. Automated exposure control, iterative reconstruction, and/or weight based adjustment of the mA/kV was utilized to reduce the radiation dose to as low as reasonably achievable. COMPARISON: Comparison with chest radiograph 07/18/2024, CT chest 06/15/2024, and CT abdomen and pelvis 06/08/2024. CLINICAL HISTORY: Sepsis. FINDINGS: CHEST: MEDIASTINUM AND LYMPH NODES: Heart size is normal. No pericardial effusions. Esophagus is decompressed. Normal caliber thoracic aorta. No dissection. Calcification of the aorta and coronary arteries. No significant mediastinal, hilar or axillary lymphadenopathy. The thyroid  gland is unremarkable. LUNGS AND PLEURA: Mild atelectasis in the lung bases. Motion artifact in the lung bases. No focal consolidation or pulmonary edema. No pleural effusion. No pneumothorax. ABDOMEN AND PELVIS: LIVER: Normal. GALLBLADDER AND BILE DUCTS: Normal. No biliary ductal dilatation. SPLEEN: Normal. PANCREAS: Normal. ADRENAL GLANDS: Normal. KIDNEYS, URETERS AND BLADDER: Kidneys, ureters, and bladder are normal. No stones in the kidneys or ureters. No hydronephrosis.  No perinephric or periureteral stranding. GI AND BOWEL: Postoperative changes consistent with gastric bypass. Small esophageal hiatal hernia. Within the hernia, there is a rounded metallic structure  possibly representing an ingested foreign body such as a coin. The stomach, small bowel, and colon are mostly decompressed. No wall thickening or inflammatory stranding are appreciated. The appendix is not demonstrated. Broad-based midline anterior abdominal wall hernia contains a portion of the transverse colon without proximal obstruction. There is no bowel obstruction. REPRODUCTIVE ORGANS: The uterus is surgically absent. No abnormal adnexal masses. PERITONEUM AND RETROPERITONEUM: No ascites. No free air. No retroperitoneal lymphadenopathy. VASCULATURE: Aorta is normal in caliber. Calcification of the aorta. No aneurysm. ABDOMINAL AND PELVIS LYMPH NODES: No significant pelvic lymphadenopathy. No retroperitoneal lymphadenopathy. BONES AND SOFT TISSUES: Postoperative right hip arthroplasty. Compression deformities at L1, T9, T6, and T4 vertebrae. No change since prior study. No acute compression is identified. Multiple compression fractures may indicate osteoporosis. No focal soft tissue abnormality. IMPRESSION: 1. No acute findings. 2. Ingested foreign body such as a coin in the distal esophagus. 3. Broad-based midline anterior abdominal wall hernia containing a portion of the transverse colon without proximal obstruction. Electronically signed by: Elsie Gravely MD 07/18/2024 09:58 PM EST RP Workstation: [id number]   CT Head Wo Contrast Result Date: 07/18/2024 CLINICAL DATA:  Seizure disorder EXAM: CT HEAD WITHOUT CONTRAST TECHNIQUE: Contiguous axial images were obtained from the base of the skull through the vertex without intravenous contrast. RADIATION DOSE REDUCTION: This exam was performed according to the departmental dose-optimization program which includes automated exposure control, adjustment of the mA and/or kV according to patient size and/or use of iterative reconstruction technique. COMPARISON:  CT brain 06/15/2024 FINDINGS: Brain: No acute territorial infarction, hemorrhage or intracranial  mass. The ventricles are nonenlarged. Vascular: No hyperdense vessels.  Carotid vascular calcification Skull: Normal. Negative for fracture or focal lesion. Sinuses/Orbits: No acute finding. Other: None IMPRESSION: No CT evidence for acute intracranial abnormality Electronically Signed   By: Luke Bun M.D.   On: 07/18/2024 21:40   DG Chest Portable 1 View Result Date: 07/18/2024 CLINICAL DATA:  Seizures, concern for aspiration. EXAM: PORTABLE CHEST 1 VIEW COMPARISON:  CT chest 06/15/2024 and chest radiograph 06/15/2024. FINDINGS: Patient's head obscures the superior mediastinum. Heart is enlarged, stable. Lungs are low in volume with minimal bibasilar streaky atelectasis. A rounded metallic density projects in the region of the gastroesophageal junction or proximal stomach. IMPRESSION: 1. Low lung volumes with bibasilar subsegmental atelectasis. 2. Rounded metallic structure projecting over the gastroesophageal junction or proximal stomach may be external to the patient. Ingested foreign body cannot be excluded. Please correlate clinically. Electronically Signed   By: Newell Eke M.D.   On: 07/18/2024 16:11       Assessment / Plan:    #6  59 year old female admitted with seizures/breakthrough.  Patient noncompliant with seizure medications.  #2 acute COVID-19 infection No pneumonia  #3 COPD on chronic O2 at home 4 to 5 L #4 history of EtOH and substance abuse-on CIWA protocol  #5 iron  deficiency anemia-recent GI workup with EGD and colonoscopy largely unrevealing other than Roux-en-Y gastric bypass with healthy anatomy.  Iron  deficiency certainly can be associated post gastric bypass due to malabsorption  #6 foreign body noted initially in her hiatal hernia on imaging on arrival yesterday, images a few hours later showed the circular metallic object to be in the stomach  Endoscopy had initially been planned but films just prior to had shown this object to have moved out  of the stomach  and into the right abdomen.  Patient thus far has been completely asymptomatic  Plan; Advance to carb modified diet today Dulcolax tablet today and start MiraLAX  17 g in 8 ounces of water  twice daily to try to help push the foreign body through to the colon Will repeat abdominal films this afternoon   Principal Problem:   Breakthrough seizure (HCC) Active Problems:   COPD (chronic obstructive pulmonary disease) (HCC)   Type 2 diabetes mellitus (HCC)   Schizoaffective disorder (HCC)   Polysubstance abuse (HCC)   Borderline personality disorder (HCC)   Chronic respiratory failure with hypoxia (HCC)   COVID-19 virus infection   Foreign body in esophagus     LOS: 1 day   Amy EsterwoodPA-C  07/20/2024, 11:17 AM     Attending Physician's Attestation   I have taken an interval history, reviewed the chart and examined the patient.   Although there was movement yesterday with the foreign body over the course of this morning's and this afternoon this x-rays is not clear to me that this foreign body is really in the colon although I do see evidence of colon distention.  I worry whether this could be in the ileocolonic transition.  If the foreign body has not moved significantly on tomorrow's plain film exam then repeat noncontrast CT will be required to evaluate the exact area of where this foreign body is.  We will continue laxative therapy to try to help move this along the GI tract.  If she has progressive worsening of pain or symptoms she needs updated imaging more quickly.  We will continue to monitor and see where things are tomorrow.  She remains high risk for endoscopic interventions due to her underlying medical comorbidities and her COVID infection.  I agree with the Advanced Practitioner's note, impression, and recommendations with updates and my documentation as noted above.  The majority of the medical decision making/process, formulation of the impression/plan of action for the  patient were performed by me with substantive portion of this encounter (>50% time spent including complete performance of at least one of the key components of MDM, History, and/or Exam).   Aloha Finner, MD  Gastroenterology Advanced Endoscopy Office # [phone]

## 2024-07-20 NOTE — Plan of Care (Signed)
" °  Problem: Education: Goal: Knowledge of risk factors and measures for prevention of condition will improve Outcome: Progressing   Problem: Coping: Goal: Psychosocial and spiritual needs will be supported Outcome: Progressing   Problem: Respiratory: Goal: Will maintain a patent airway Outcome: Progressing Goal: Complications related to the disease process, condition or treatment will be avoided or minimized Outcome: Progressing   Problem: Education: Goal: Ability to describe self-care measures that may prevent or decrease complications (Diabetes Survival Skills Education) will improve Outcome: Progressing Goal: Individualized Educational Video(s) Outcome: Progressing   Problem: Coping: Goal: Ability to adjust to condition or change in health will improve Outcome: Progressing   Problem: Fluid Volume: Goal: Ability to maintain a balanced intake and output will improve Outcome: Progressing   Problem: Health Behavior/Discharge Planning: Goal: Ability to identify and utilize available resources and services will improve Outcome: Progressing Goal: Ability to manage health-related needs will improve Outcome: Progressing   Problem: Metabolic: Goal: Ability to maintain appropriate glucose levels will improve Outcome: Progressing   Problem: Nutritional: Goal: Maintenance of adequate nutrition will improve Outcome: Progressing Goal: Progress toward achieving an optimal weight will improve Outcome: Progressing   Problem: Skin Integrity: Goal: Risk for impaired skin integrity will decrease Outcome: Progressing   Problem: Tissue Perfusion: Goal: Adequacy of tissue perfusion will improve Outcome: Progressing   Problem: Education: Goal: Knowledge of General Education information will improve Description: Including pain rating scale, medication(s)/side effects and non-pharmacologic comfort measures Outcome: Progressing   Problem: Health Behavior/Discharge Planning: Goal:  Ability to manage health-related needs will improve Outcome: Progressing   Problem: Clinical Measurements: Goal: Ability to maintain clinical measurements within normal limits will improve Outcome: Progressing Goal: Will remain free from infection Outcome: Progressing Goal: Diagnostic test results will improve Outcome: Progressing Goal: Respiratory complications will improve Outcome: Progressing Goal: Cardiovascular complication will be avoided Outcome: Progressing   Problem: Activity: Goal: Risk for activity intolerance will decrease Outcome: Progressing   Problem: Nutrition: Goal: Adequate nutrition will be maintained Outcome: Progressing   Problem: Coping: Goal: Level of anxiety will decrease Outcome: Progressing   Problem: Elimination: Goal: Will not experience complications related to bowel motility Outcome: Progressing Goal: Will not experience complications related to urinary retention Outcome: Progressing   Problem: Pain Managment: Goal: General experience of comfort will improve and/or be controlled Outcome: Progressing   Problem: Safety: Goal: Ability to remain free from injury will improve Outcome: Progressing   Problem: Skin Integrity: Goal: Risk for impaired skin integrity will decrease Outcome: Progressing   Problem: Safety: Goal: Non-violent Restraint(s) Outcome: Progressing   "

## 2024-07-20 NOTE — TOC CM/SW Note (Signed)
 Transition of Care [facility]) - Inpatient Brief Assessment   Patient Details  Name: [name] MRN: [id number] Date of Birth: 1965/12/16  Transition of Care [facility]) CM/SW Contact:    Almarie CHRISTELLA Goodie, LCSW Phone Number: 07/20/2024, 11:16 AM   Clinical Narrative:   Patient usually from home alone, had been staying with her sister due to weather. Admitted with breakthrough seizures due to missed medication, foreign body in esophagus, and alcohol  dependence. CSW acknowledging consult for substance abuse counseling, patient too confused to participate in assessment at this time. CSW to follow.    Transition of Care Asessment: Insurance and Status: Insurance coverage has been reviewed Patient has primary care physician: Yes Home environment has been reviewed: Home alone Prior level of function:: Independent Prior/Current Home Services: No current home services Social Drivers of Health Review: SDOH reviewed no interventions necessary Readmission risk has been reviewed: Yes Transition of care needs: transition of care needs identified, TOC will continue to follow

## 2024-07-21 ENCOUNTER — Other Ambulatory Visit (HOSPITAL_COMMUNITY): Payer: Self-pay

## 2024-07-21 ENCOUNTER — Inpatient Hospital Stay (HOSPITAL_COMMUNITY)

## 2024-07-21 ENCOUNTER — Telehealth (HOSPITAL_COMMUNITY): Payer: Self-pay

## 2024-07-21 DIAGNOSIS — R935 Abnormal findings on diagnostic imaging of other abdominal regions, including retroperitoneum: Secondary | ICD-10-CM

## 2024-07-21 DIAGNOSIS — T189XXS Foreign body of alimentary tract, part unspecified, sequela: Secondary | ICD-10-CM

## 2024-07-21 MED ORDER — LEVETIRACETAM 500 MG PO TABS
500.0000 mg | ORAL_TABLET | Freq: Two times a day (BID) | ORAL | 0 refills | Status: AC
  Filled 2024-07-21: qty 60, 30d supply, fill #0

## 2024-07-21 MED ORDER — POLYETHYLENE GLYCOL 3350 17 GM/SCOOP PO POWD
17.0000 g | Freq: Every day | ORAL | 0 refills | Status: AC | PRN
  Filled 2024-07-21: qty 238, 14d supply, fill #0
  Filled 2024-07-21: qty 30, 30d supply, fill #0

## 2024-07-21 MED ORDER — THIAMINE HCL 100 MG PO TABS
100.0000 mg | ORAL_TABLET | Freq: Every day | ORAL | 0 refills | Status: AC
  Filled 2024-07-21: qty 30, 30d supply, fill #0

## 2024-07-21 MED ORDER — ALBUTEROL SULFATE HFA 108 (90 BASE) MCG/ACT IN AERS
2.0000 | INHALATION_SPRAY | RESPIRATORY_TRACT | 1 refills | Status: AC | PRN
  Filled 2024-07-21: qty 18, 25d supply, fill #0

## 2024-07-21 MED ORDER — DIVALPROEX SODIUM ER 250 MG PO TB24
250.0000 mg | ORAL_TABLET | Freq: Every day | ORAL | 0 refills | Status: AC
  Filled 2024-07-21: qty 30, 30d supply, fill #0

## 2024-07-21 MED ORDER — METFORMIN HCL 500 MG PO TABS
500.0000 mg | ORAL_TABLET | Freq: Every day | ORAL | 0 refills | Status: AC
  Filled 2024-07-21: qty 30, 30d supply, fill #0

## 2024-07-21 MED ORDER — TRAZODONE HCL 50 MG PO TABS
50.0000 mg | ORAL_TABLET | Freq: Every evening | ORAL | 0 refills | Status: AC | PRN
  Filled 2024-07-21: qty 30, 30d supply, fill #0

## 2024-07-21 MED ORDER — ACETAMINOPHEN ER 650 MG PO TBCR
1300.0000 mg | EXTENDED_RELEASE_TABLET | Freq: Three times a day (TID) | ORAL | 0 refills | Status: AC | PRN
  Filled 2024-07-21: qty 90, 15d supply, fill #0

## 2024-07-21 MED ORDER — FOLIC ACID 1 MG PO TABS
1.0000 mg | ORAL_TABLET | Freq: Every day | ORAL | 0 refills | Status: AC
  Filled 2024-07-21: qty 30, 30d supply, fill #0

## 2024-07-21 MED ORDER — ONDANSETRON 4 MG PO TBDP
4.0000 mg | ORAL_TABLET | Freq: Three times a day (TID) | ORAL | 0 refills | Status: AC | PRN
  Filled 2024-07-21: qty 30, 10d supply, fill #0

## 2024-07-21 MED ORDER — LANCET DEVICE MISC
1.0000 | 0 refills | Status: AC
  Filled 2024-07-21: qty 1, 1d supply, fill #0

## 2024-07-21 MED ORDER — PANTOPRAZOLE SODIUM 40 MG PO TBEC
40.0000 mg | DELAYED_RELEASE_TABLET | Freq: Every day | ORAL | 2 refills | Status: AC
  Filled 2024-07-21: qty 30, 30d supply, fill #0

## 2024-07-21 MED ORDER — LIDOCAINE 5 % EX PTCH
1.0000 | MEDICATED_PATCH | Freq: Every day | CUTANEOUS | 0 refills | Status: AC | PRN
  Filled 2024-07-21: qty 30, 30d supply, fill #0

## 2024-07-21 NOTE — Progress Notes (Signed)
 PHARMACY - PHYSICIAN COMMUNICATION CRITICAL VALUE ALERT - BLOOD CULTURE IDENTIFICATION (BCID)  [name] is an 59 y.o. female who presented to [facility] on 07/18/2024 with a chief complaint of seizures.  Assessment:  GPR 1/4 bottles (aerobic) - Numerically stable infectious markers (afebrile, wbc wnl)  - Coronavirus 2(+)  Name of physician (or Provider) Contacted: Dr. Maranda  Current antibiotics: none  Changes to prescribed antibiotics recommended:  - No changes as this likely represents a contaminant and would monitor off antibiotics - Provider agreed  No results found for this or any previous visit.  [name] [facility] 07/21/2024  9:04 AM

## 2024-07-21 NOTE — Telephone Encounter (Signed)
 Pharmacy Patient Advocate Encounter  Received notification from [facility] MEDICARE that Prior Authorization for Lidocaine  5% patches has been APPROVED from 07/21/24 to 06/22/25. Ran test claim, Copay is $1.60. This test claim was processed through [facility]- copay amounts may vary at other pharmacies due to pharmacy/plan contracts, or as the patient moves through the different stages of their insurance plan.   PA #/Case ID/Reference #: [id number]

## 2024-07-21 NOTE — Discharge Instructions (Signed)
 SABRA

## 2024-07-21 NOTE — Discharge Summary (Signed)
 " Physician Discharge Summary   Patient: [name] MRN: [id number] DOB: [birth year]  Admit date:     07/18/2024  Discharge date: 07/21/24  Discharge Physician: Lonni KANDICE Moose   PCP: [name], NP   Recommendations at discharge:   Follow-up with PCP in the next 1 to 2 weeks. Take your medicines as prescribed Discuss alcohol  cessation with your PCP. Follow-up x-ray with PCP in the next week.  If you develop abdominal pain nausea or vomiting recurs proceed to the ER.  Discharge Diagnoses: Principal Problem:   Breakthrough seizure (HCC) Active Problems:   COPD (chronic obstructive pulmonary disease) (HCC)   Chronic respiratory failure with hypoxia (HCC)   Schizoaffective disorder (HCC)   Type 2 diabetes mellitus (HCC)   Polysubstance abuse (HCC)   Borderline personality disorder (HCC)   COVID-19 virus infection   Foreign body in esophagus   Abnormal x-ray of abdomen   Foreign body alimentary tract, sequela  Resolved Problems:   * No resolved hospital problems. *  Hospital Course: 59 year old female with seizure disorder, history of medical noncompliance, schizoaffective disorder, polysubstance abuse, chronic respiratory failure on 5 to 6 L of oxygen  presented with breakthrough seizures.  Family noted that she does not take her medications on a regular basis.  She was seen by neurology loaded with IV Keppra .  EEG showed no epileptiform discharges.  She had no further seizures during the hospital stay.  She developed some mild alcohol  withdrawal was placed on the CIWA protocol.  She was also noted to be incidentally COVID-positive without change in her respiratory status.  She was not felt to be a candidate for remdesivir.  In addition she was noted noted to have a foreign body in the distal esophagus but did not recall ingesting any metallic objects.  GI was consulted and she had serial KUBs showing movement to the right lower quadrant.  Presumably mated to the  ascending colon.  Plans were to follow it until it was clearly in the colon however the patient became quite agitated about being in the hospital, and threatened to leave AMA.  Did not appear to be in active alcohol  withdrawal at that time.  Her abdominal exam was benign and she stated she was passing gas and stool.  She was tolerating diet.  She had no further evidence of alcohol  withdrawal.  This point the patient appears to be safe for discharge with outpatient follow-up with her PCP.  If she develops abdominal pain, nausea or vomiting, or inability to pass flatus she should be return to the ER for repeat radiograph.  At this point risk-benefit favors allowing her to proceed home in the care of her family.  Will need to return to the ER if she develops abdominal complaints.  Assessment and Plan: 1. Breakthrough seizures  - Appreciate neurology consultation  - No acute findings on head CT  -Peers to have resolved.  Continue IV Keppra  overnight and consider discharge in the morning. - Continue Keppra  at home dose, will use IV formulation for now given her somnolence, monitor for and treat alcohol  withdrawal     2. Foreign body in distal esophagus  - Coin or other objected noted in distal esophagus on CT in ED  - GI recommends keeping patient NPO  -repeat CXR shows FB is moving.   3. Alcohol  dependence and drug abuse  - Monitor with CIWA, use Ativan  as-needed, supplement vitamins, consult TOC  - Currently no tremors diaphoresis or confusion.  No asterixis  4. COPD; chronic hypoxic respiratory failure  -Uses 5 to 6 L/min at home. - Not in exacerbation  - Continue supplemental O2, use DuoNebs as-needed     5. Type II DM  - A1c was 6.2% in December 2025  - Check CBGs and use low-intensity SSI for now  6.  COVID positive -Asymptomatic -No additional oxygen  requirement -Chest x-ray on 07/19/24 with clear lungs.     DVT prophylaxis: Lovenox   Code Status: Full  Level of Care: Level of  care: Progressive Family Communication: Daughter updated from ED   Disposition Plan:  Patient is from: Home  Anticipated d/c is to: Home  Anticipated d/c date is: Possibly 128 if foreign body passes to her large bowel. Patient currently: Pending GI consultation and clinical improvement Consults called: GI, Neurology Admission status: Observation      DVT prophylaxis: SCDs Start: 07/19/24 0151       Consultants: Neurology, GI Procedures performed: CT scans, serial KUBs, EEG, head CT without contrast CT chest abdomen pelvis with contrast Disposition: Home Diet recommendation:  Discharge Diet Orders (From admission, onward)     Start     Ordered   07/21/24 0000  Diet Carb Modified        07/21/24 0748           Regular diet DISCHARGE MEDICATION: Allergies as of 07/21/2024       Reactions   Iron  Dextran Shortness Of Breath, Anxiety   Aspirin  Nausea And Vomiting, Other (See Comments)   Ok to take tylenol  or ibuprofen    Metformin  Hcl Er Diarrhea, Other (See Comments)   Gi issues with the 750 mg ER version   Penicillins Other (See Comments)   Unknown reaction from childhood: family would like this to remain as an allergy. Tolerated cephalosporins and zosyn         Medication List     TAKE these medications    acetaminophen  650 MG CR tablet Commonly known as: Tylenol  8 Hour Arthritis Pain Take 2 tablets (1,300 mg total) by mouth every 8 (eight) hours as needed for pain.   albuterol  108 (90 Base) MCG/ACT inhaler Commonly known as: Ventolin  HFA Inhale 2 puffs into the lungs every 4 (four) hours as needed for wheezing or shortness of breath.   divalproex  250 MG 24 hr tablet Commonly known as: Depakote  ER Take 1 tablet (250 mg total) by mouth daily.   DOANS PILLS PO Take 1 tablet by mouth daily.   folic acid  1 MG tablet Commonly known as: FOLVITE  Take 1 tablet (1 mg total) by mouth daily.   Lancet Device Misc Use up to four times daily as directed. (FOR  ICD-10 E10.9, E11.9).   levETIRAcetam  500 MG tablet Commonly known as: Keppra  Take 1 tablet (500 mg total) by mouth 2 (two) times daily. What changed: Another medication with the same name was removed. Continue taking this medication, and follow the directions you see here.   lidocaine  5 % Commonly known as: LIDODERM  Place 1 patch onto the skin daily as needed (pain). Remove & Discard patch within 12 hours or as directed by MD   metFORMIN  500 MG tablet Commonly known as: GLUCOPHAGE  Take 1 tablet (500 mg total) by mouth daily with breakfast.   ondansetron  4 MG disintegrating tablet Commonly known as: ZOFRAN -ODT Dissolve 1 tablet (4 mg total) by mouth every 8 (eight) hours as needed for nausea or vomiting.   OXYGEN  Inhale 4-5 L/min into the lungs See admin instructions. Inhale 4-5 L/min into the  lungs at bedtime and during the day as needed for shortness of breath   pantoprazole  40 MG tablet Commonly known as: Protonix  Take 1 tablet (40 mg total) by mouth daily.   polyethylene glycol 17 g packet Commonly known as: MIRALAX  / GLYCOLAX  Take 17 g by mouth daily as needed.   thiamine  100 MG tablet Commonly known as: Vitamin B-1 Take 1 tablet (100 mg total) by mouth daily.   traZODone  50 MG tablet Commonly known as: DESYREL  Take 1 tablet (50 mg total) by mouth at bedtime as needed for sleep.        Discharge Exam: Filed Weights   07/18/24 1509  Weight: 58.9 kg   Currently awake and calm sitting on the side of the bed talking to her daughter on the telephone nontoxic HEENT: PERRLA Cabbell EOMI no scleral icterus CV: Regular rate and rhythm mildly tachycardic but at baseline, no murmurs rubs gallops Lungs: Clear throughout with good air entry Abdomen: Soft, nontender, nondistended bowel sounds present EXTR Extremities: Warm well-perfused no edema Neurologic: No seizure activity, answers questions appropriately, moves all extremities.  Walking around the room.   Condition  at discharge: stable  The results of significant diagnostics from this hospitalization (including imaging, microbiology, ancillary and laboratory) are listed below for reference.   Imaging Studies: DG Abd 2 Views Result Date: 07/20/2024 CLINICAL DATA:  Foreign body EXAM: DG ABDOMEN 2V COMPARISON:  07/20/2024, CT 07/18/2024 FINDINGS: Nonobstructed gas pattern. No free air beneath the diaphragm. 2.2 cm round metallic foreign object projects over right lower quadrant colon. Moderate retained stool in the colon. No radiopaque calculi. IMPRESSION: 2.2 cm round metallic foreign object projects over right lower quadrant colon. Nonobstructed gas pattern. Electronically Signed   By: Luke Bun M.D.   On: 07/20/2024 20:13   DG Abd 2 Views Result Date: 07/20/2024 CLINICAL DATA:  Foreign body. EXAM: ABDOMEN - 2 VIEW COMPARISON:  Abdominal radiograph dated 07/19/2024. FINDINGS: Rounded radiopaque foreign object in the right hemiabdomen projects over the proximal colon. No bowel dilatation or evidence of obstruction. No free air. Degenerative changes of the spine. Right hip arthroplasty. IMPRESSION: Rounded radiopaque foreign object in the right hemiabdomen projects over the proximal colon. Electronically Signed   By: Vanetta Chou M.D.   On: 07/20/2024 17:54   DG CHEST PORT 1 VIEW Result Date: 07/19/2024 EXAM: 1 VIEW(S) XRAY OF THE CHEST 07/19/2024 07:06:00 PM COMPARISON: 07/19/2024 CLINICAL HISTORY: COVID-19 positivity. FINDINGS: LUNGS AND PLEURA: Lungs are well aerated without focal infiltrate or effusion. No pneumothorax. HEART AND MEDIASTINUM: Cardiac shadow is stable. No acute abnormality of the mediastinal silhouette. BONES AND SOFT TISSUES: No acute osseous abnormality. IMPRESSION: 1. No acute findings. Electronically signed by: Oneil Devonshire MD 07/19/2024 11:45 PM EST RP Workstation: [id number] 1 View Result Date: 07/19/2024 CLINICAL DATA:  59 year old female with foreign body in the  alimentary tract. EXAM: ABDOMEN - 1 VIEW COMPARISON:  05/28/2024, 07/18/2024 FINDINGS: The bowel gas pattern is normal. There is an approximately 2.2 cm linear metallic object in the right mid abdomen compatible with foreign body visualized on recent comparison CT. IMPRESSION: Approximately 2.2 cm linear metallic object in the right mid abdomen compatible with foreign body visualized on recent comparison CT. No radiographic evidence of bowel obstruction. Electronically Signed   By: Ester Sides M.D.   On: 07/19/2024 13:05   Overnight EEG with video Result Date: 07/19/2024 Shelton Arlin KIDD, MD     07/19/2024  5:53 PM Patient Name: Ronita LITTIE Sprang  MRN: [id number] Epilepsy Attending: Arlin MALVA Krebs Referring Physician/Provider: everitt Clint Abbey Earle FORBES, NP Duration: 07/18/2024 1927 to 07/19/2024 1443 Patient history: 59 y.o. female with hx of seizure disorder with noncompliance to medications who presents with 3 breakthrough seizures followed by a seizure witnessed by EMS and another seizure in the emergency department. EEG to evaluate for seizure Level of alertness: Awake, asleep AEDs during EEG study: LEV, Ativan  Technical aspects: This EEG study was done with scalp electrodes positioned according to the 10-20 International system of electrode placement. Electrical activity was reviewed with band pass filter of 1-70Hz , sensitivity of 7 uV/mm, display speed of 36mm/sec with a 60Hz  notched filter applied as appropriate. EEG data were recorded continuously and digitally stored.  Video monitoring was available and reviewed as appropriate. Description: The posterior dominant rhythm consists of 8-9 Hz activity of moderate voltage (25-35 uV) seen predominantly in posterior head regions, symmetric and reactive to eye opening and eye closing. Sleep was characterized by vertex waves, sleep spindles (12 to 14 Hz), maximal frontocentral region. EEG showed continuous generalized 3 to 6 Hz theta-delta slowing admixed with an  excessive amount of 15 to 18 Hz beta activity distributed symmetrically and diffusely. Hyperventilation and photic stimulation were not performed.   EEG was disconnected between 07/19/2024 0817 to 1234 for testing ABNORMALITY - Continuous slow, generalized - Excessive beta, generalized IMPRESSION: This study is suggestive of generalized cerebral dysfunction (encephalopathy). The excessive beta activity seen in the background is most likely due to the effect of benzodiazepine and is a benign EEG pattern. No seizures or epileptiform discharges were seen throughout the recording. Arlin MALVA Krebs   DG CHEST PORT 1 VIEW Result Date: 07/19/2024 EXAM: 1 VIEW(S) XRAY OF THE CHEST 07/19/2024 07:13:47 AM COMPARISON: 07/18/2024 CLINICAL HISTORY: 87768 Foreign body in esophagus 12231 Foreign body in esophagus. ICD10: T18.1 Foreign body in esophagus. FINDINGS: LUNGS AND PLEURA: Mild atelectasis within the right base. No pleural effusion. No pneumothorax. HEART AND MEDIASTINUM: No acute abnormality of the cardiac and mediastinal silhouettes. BONES AND SOFT TISSUES: The previously noted rounded metallic foreign body is identified within the left upper quadrant of the abdomen approximately 5 cm below the expected location of the GE junction. No acute osseous abnormality. IMPRESSION: 1. Rounded metallic foreign body in the left upper quadrant of the abdomen, approximately 5 cm below the expected location of the GE junction. Electronically signed by: Waddell Calk MD 07/19/2024 07:19 AM EST RP Workstation: [id number]   CT CHEST ABDOMEN PELVIS W CONTRAST Result Date: 07/18/2024 EXAM: CT CHEST, ABDOMEN AND PELVIS WITH CONTRAST 07/18/2024 09:33:52 PM TECHNIQUE: CT of the chest, abdomen and pelvis was performed with the administration of 75 mL of iohexol  (OMNIPAQUE ) 350 MG/ML injection. Multiplanar reformatted images are provided for review. Automated exposure control, iterative reconstruction, and/or weight based adjustment of the  mA/kV was utilized to reduce the radiation dose to as low as reasonably achievable. COMPARISON: Comparison with chest radiograph 07/18/2024, CT chest 06/15/2024, and CT abdomen and pelvis 06/08/2024. CLINICAL HISTORY: Sepsis. FINDINGS: CHEST: MEDIASTINUM AND LYMPH NODES: Heart size is normal. No pericardial effusions. Esophagus is decompressed. Normal caliber thoracic aorta. No dissection. Calcification of the aorta and coronary arteries. No significant mediastinal, hilar or axillary lymphadenopathy. The thyroid  gland is unremarkable. LUNGS AND PLEURA: Mild atelectasis in the lung bases. Motion artifact in the lung bases. No focal consolidation or pulmonary edema. No pleural effusion. No pneumothorax. ABDOMEN AND PELVIS: LIVER: Normal. GALLBLADDER AND BILE DUCTS: Normal. No biliary ductal dilatation. SPLEEN: Normal. PANCREAS:  Normal. ADRENAL GLANDS: Normal. KIDNEYS, URETERS AND BLADDER: Kidneys, ureters, and bladder are normal. No stones in the kidneys or ureters. No hydronephrosis. No perinephric or periureteral stranding. GI AND BOWEL: Postoperative changes consistent with gastric bypass. Small esophageal hiatal hernia. Within the hernia, there is a rounded metallic structure possibly representing an ingested foreign body such as a coin. The stomach, small bowel, and colon are mostly decompressed. No wall thickening or inflammatory stranding are appreciated. The appendix is not demonstrated. Broad-based midline anterior abdominal wall hernia contains a portion of the transverse colon without proximal obstruction. There is no bowel obstruction. REPRODUCTIVE ORGANS: The uterus is surgically absent. No abnormal adnexal masses. PERITONEUM AND RETROPERITONEUM: No ascites. No free air. No retroperitoneal lymphadenopathy. VASCULATURE: Aorta is normal in caliber. Calcification of the aorta. No aneurysm. ABDOMINAL AND PELVIS LYMPH NODES: No significant pelvic lymphadenopathy. No retroperitoneal lymphadenopathy. BONES AND  SOFT TISSUES: Postoperative right hip arthroplasty. Compression deformities at L1, T9, T6, and T4 vertebrae. No change since prior study. No acute compression is identified. Multiple compression fractures may indicate osteoporosis. No focal soft tissue abnormality. IMPRESSION: 1. No acute findings. 2. Ingested foreign body such as a coin in the distal esophagus. 3. Broad-based midline anterior abdominal wall hernia containing a portion of the transverse colon without proximal obstruction. Electronically signed by: Elsie Gravely MD 07/18/2024 09:58 PM EST RP Workstation: [id number]   CT Head Wo Contrast Result Date: 07/18/2024 CLINICAL DATA:  Seizure disorder EXAM: CT HEAD WITHOUT CONTRAST TECHNIQUE: Contiguous axial images were obtained from the base of the skull through the vertex without intravenous contrast. RADIATION DOSE REDUCTION: This exam was performed according to the departmental dose-optimization program which includes automated exposure control, adjustment of the mA and/or kV according to patient size and/or use of iterative reconstruction technique. COMPARISON:  CT brain 06/15/2024 FINDINGS: Brain: No acute territorial infarction, hemorrhage or intracranial mass. The ventricles are nonenlarged. Vascular: No hyperdense vessels.  Carotid vascular calcification Skull: Normal. Negative for fracture or focal lesion. Sinuses/Orbits: No acute finding. Other: None IMPRESSION: No CT evidence for acute intracranial abnormality Electronically Signed   By: Luke Bun M.D.   On: 07/18/2024 21:40   DG Chest Portable 1 View Result Date: 07/18/2024 CLINICAL DATA:  Seizures, concern for aspiration. EXAM: PORTABLE CHEST 1 VIEW COMPARISON:  CT chest 06/15/2024 and chest radiograph 06/15/2024. FINDINGS: Patient's head obscures the superior mediastinum. Heart is enlarged, stable. Lungs are low in volume with minimal bibasilar streaky atelectasis. A rounded metallic density projects in the region of the  gastroesophageal junction or proximal stomach. IMPRESSION: 1. Low lung volumes with bibasilar subsegmental atelectasis. 2. Rounded metallic structure projecting over the gastroesophageal junction or proximal stomach may be external to the patient. Ingested foreign body cannot be excluded. Please correlate clinically. Electronically Signed   By: Newell Eke M.D.   On: 07/18/2024 16:11    Microbiology: Results for orders placed or performed during the hospital encounter of 07/18/24  Culture, blood (routine x 2)     Status: None (Preliminary result)   Collection Time: 07/18/24  3:32 PM   Specimen: BLOOD RIGHT FOREARM  Result Value Ref Range Status   Specimen Description BLOOD RIGHT FOREARM  Final   Special Requests   Final    BOTTLES DRAWN AEROBIC AND ANAEROBIC Blood Culture results may not be optimal due to an inadequate volume of blood received in culture bottles   Culture   Final    NO GROWTH 2 DAYS Performed at [facility], [address], [place], [place] [address]    Report Status PENDING  Incomplete  Culture, blood (routine x 2)     Status: None (Preliminary result)   Collection Time: 07/18/24  3:55 PM   Specimen: BLOOD LEFT FOREARM  Result Value Ref Range Status   Specimen Description BLOOD LEFT FOREARM  Final   Special Requests   Final    BOTTLES DRAWN AEROBIC AND ANAEROBIC Blood Culture results may not be optimal due to an inadequate volume of blood received in culture bottles   Culture   Final    NO GROWTH 2 DAYS Performed at [facility], [address], [place], [place] [address]    Report Status PENDING  Incomplete  Resp panel by RT-PCR (RSV, Flu A&B, Covid) Anterior Nasal Swab     Status: Abnormal   Collection Time: 07/18/24  6:49 PM   Specimen: Anterior Nasal Swab  Result Value Ref Range Status   SARS Coronavirus 2 by RT PCR POSITIVE (A) NEGATIVE Final   Influenza A by PCR NEGATIVE NEGATIVE Final   Influenza B by PCR NEGATIVE NEGATIVE Final     Comment: (NOTE) The Xpert Xpress SARS-CoV-2/FLU/RSV plus assay is intended as an aid in the diagnosis of influenza from Nasopharyngeal swab specimens and should not be used as a sole basis for treatment. Nasal washings and aspirates are unacceptable for Xpert Xpress SARS-CoV-2/FLU/RSV testing.  Fact Sheet for Patients: bloggercourse.com  Fact Sheet for Healthcare Providers: seriousbroker.it  This test is not yet approved or cleared by the [place]  FDA and has been authorized for detection and/or diagnosis of SARS-CoV-2 by FDA under an Emergency Use Authorization (EUA). This EUA will remain in effect (meaning this test can be used) for the duration of the COVID-19 declaration under Section 564(b)(1) of the Act, 21 U.S.C. section 360bbb-3(b)(1), unless the authorization is terminated or revoked.     Resp Syncytial Virus by PCR NEGATIVE NEGATIVE Final    Comment: (NOTE) Fact Sheet for Patients: bloggercourse.com  Fact Sheet for Healthcare Providers: seriousbroker.it  This test is not yet approved or cleared by the [place]  FDA and has been authorized for detection and/or diagnosis of SARS-CoV-2 by FDA under an Emergency Use Authorization (EUA). This EUA will remain in effect (meaning this test can be used) for the duration of the COVID-19 declaration under Section 564(b)(1) of the Act, 21 U.S.C. section 360bbb-3(b)(1), unless the authorization is terminated or revoked.  Performed at [facility], [address] [place], [place] [address]     Labs: CBC: Recent Labs  Lab 07/18/24 1530 07/18/24 1538 07/19/24 0257 07/20/24 1141  WBC 11.2*  --  7.8 6.7  NEUTROABS 9.8*  --   --   --   HGB 11.1* 13.6 11.6* 12.1  HCT 35.6* 40.0 35.1* 38.6  MCV 81.7  --  76.6* 80.4  PLT 187  --  170 172   Basic Metabolic Panel: Recent Labs  Lab 07/18/24 1530  07/18/24 1538 07/19/24 0257 07/20/24 1141  NA 131* 132* 136 135  K 4.2 4.1 3.4* 4.0  CL 95* 99 97* 98  CO2 18*  --  27 24  GLUCOSE 152* 141* 132* 110*  BUN 6 5* <5* <5*  CREATININE 0.69 0.60 0.55 0.65  CALCIUM  8.4*  --  8.4* 9.0  MG  --   --  2.0  --    Liver Function Tests: Recent Labs  Lab 07/18/24 1530  AST 32  ALT 25  ALKPHOS 99  BILITOT 0.7  PROT 6.9  ALBUMIN  4.0   CBG: Recent Labs  Lab 07/20/24 0004 07/20/24 0403 07/20/24 0803 07/20/24 1116 07/20/24 1640  GLUCAP 133* 129* 145* 125* 134*    Discharge time spent: greater than 30 minutes.  Signed: Lonni KANDICE Moose, MD [facility] 07/21/2024 "

## 2024-07-21 NOTE — Progress Notes (Signed)
 Patient upset, wants to get out of the unit. Pt's daughter called to give Gisele Frisk, [place], [place] and [place] RN accompanied pt. Downstairs, pt's daughter also met them downstairs.

## 2024-07-21 NOTE — Progress Notes (Signed)
 AVS given to patient, D/C ready. Patient asked to eat breakfast first before leaving.

## 2024-07-21 NOTE — Plan of Care (Signed)
" °  Problem: Education: Goal: Knowledge of risk factors and measures for prevention of condition will improve Outcome: Progressing   Problem: Coping: Goal: Psychosocial and spiritual needs will be supported Outcome: Progressing   Problem: Respiratory: Goal: Will maintain a patent airway Outcome: Progressing Goal: Complications related to the disease process, condition or treatment will be avoided or minimized Outcome: Progressing   Problem: Education: Goal: Ability to describe self-care measures that may prevent or decrease complications (Diabetes Survival Skills Education) will improve Outcome: Progressing Goal: Individualized Educational Video(s) Outcome: Progressing   Problem: Coping: Goal: Ability to adjust to condition or change in health will improve Outcome: Progressing   Problem: Fluid Volume: Goal: Ability to maintain a balanced intake and output will improve Outcome: Progressing   Problem: Health Behavior/Discharge Planning: Goal: Ability to identify and utilize available resources and services will improve Outcome: Progressing Goal: Ability to manage health-related needs will improve Outcome: Progressing   Problem: Metabolic: Goal: Ability to maintain appropriate glucose levels will improve Outcome: Progressing   Problem: Nutritional: Goal: Maintenance of adequate nutrition will improve Outcome: Progressing Goal: Progress toward achieving an optimal weight will improve Outcome: Progressing   Problem: Skin Integrity: Goal: Risk for impaired skin integrity will decrease Outcome: Progressing   Problem: Tissue Perfusion: Goal: Adequacy of tissue perfusion will improve Outcome: Progressing   Problem: Education: Goal: Knowledge of General Education information will improve Description: Including pain rating scale, medication(s)/side effects and non-pharmacologic comfort measures Outcome: Progressing   Problem: Health Behavior/Discharge Planning: Goal:  Ability to manage health-related needs will improve Outcome: Progressing   Problem: Clinical Measurements: Goal: Ability to maintain clinical measurements within normal limits will improve Outcome: Progressing Goal: Will remain free from infection Outcome: Progressing Goal: Diagnostic test results will improve Outcome: Progressing Goal: Respiratory complications will improve Outcome: Progressing Goal: Cardiovascular complication will be avoided Outcome: Progressing   Problem: Activity: Goal: Risk for activity intolerance will decrease Outcome: Progressing   Problem: Nutrition: Goal: Adequate nutrition will be maintained Outcome: Progressing   Problem: Coping: Goal: Level of anxiety will decrease Outcome: Progressing   Problem: Elimination: Goal: Will not experience complications related to bowel motility Outcome: Progressing Goal: Will not experience complications related to urinary retention Outcome: Progressing   Problem: Pain Managment: Goal: General experience of comfort will improve and/or be controlled Outcome: Progressing   Problem: Safety: Goal: Ability to remain free from injury will improve Outcome: Progressing   Problem: Skin Integrity: Goal: Risk for impaired skin integrity will decrease Outcome: Progressing   Problem: Safety: Goal: Non-violent Restraint(s) Outcome: Progressing   "

## 2024-07-22 ENCOUNTER — Other Ambulatory Visit (HOSPITAL_COMMUNITY): Payer: Self-pay

## 2024-07-22 ENCOUNTER — Telehealth: Payer: Self-pay

## 2024-07-22 NOTE — Transitions of Care (Post Inpatient/ED Visit) (Signed)
" ° °  07/22/2024  Name: [name] MRN: [id number] DOB: Dec 30, 1965  Today's TOC FU Call Status: Today's TOC FU Call Status:: Successful TOC FU Call Completed TOC FU Call Complete Date: 07/22/24 (Spoke with daughter, [name] who states she is a SW at [facility] and understands patient's needs/meds and declined TOC call program)  Patient's Name and Date of Birth confirmed. DOB, Name  Transition Care Management Follow-up Telephone Call Date of Discharge: 07/21/24 Discharge Facility: [facility]) Type of Discharge: Inpatient Admission Primary Inpatient Discharge Diagnosis:: Breakthrough seizure  Shona Prow RN, CCM [place]  VBCI-Population Health RN Care Manager [phone]  "

## 2024-07-23 LAB — CULTURE, BLOOD (ROUTINE X 2): Culture: NO GROWTH

## 2024-07-23 LAB — LEVETIRACETAM LEVEL: Levetiracetam Lvl: 37.7 ug/mL (ref 10.0–40.0)

## 2024-07-24 LAB — CULTURE, BLOOD (ROUTINE X 2)

## 2024-07-25 ENCOUNTER — Other Ambulatory Visit (HOSPITAL_COMMUNITY): Payer: Self-pay

## 2024-07-27 ENCOUNTER — Other Ambulatory Visit (HOSPITAL_COMMUNITY): Payer: Self-pay

## 2024-08-23 ENCOUNTER — Institutional Professional Consult (permissible substitution): Admitting: Neurology
